# Patient Record
Sex: Male | Born: 1950 | Race: Black or African American | Hispanic: No | Smoking: Never smoker
Health system: Southern US, Community
[De-identification: ages and names within clinical notes are randomized; demographics above are authoritative.]

## PROBLEM LIST (undated history)

## (undated) ENCOUNTER — Emergency Department (HOSPITAL_COMMUNITY): Admission: EM | Payer: BC Managed Care – PPO | Source: Home / Self Care

## (undated) DIAGNOSIS — R4586 Emotional lability: Secondary | ICD-10-CM

## (undated) DIAGNOSIS — N189 Chronic kidney disease, unspecified: Secondary | ICD-10-CM

## (undated) DIAGNOSIS — M109 Gout, unspecified: Secondary | ICD-10-CM

## (undated) DIAGNOSIS — E1165 Type 2 diabetes mellitus with hyperglycemia: Secondary | ICD-10-CM

## (undated) DIAGNOSIS — I10 Essential (primary) hypertension: Secondary | ICD-10-CM

## (undated) DIAGNOSIS — Z9989 Dependence on other enabling machines and devices: Secondary | ICD-10-CM

## (undated) DIAGNOSIS — E785 Hyperlipidemia, unspecified: Secondary | ICD-10-CM

## (undated) DIAGNOSIS — F319 Bipolar disorder, unspecified: Secondary | ICD-10-CM

## (undated) DIAGNOSIS — Z86718 Personal history of other venous thrombosis and embolism: Secondary | ICD-10-CM

## (undated) DIAGNOSIS — E118 Type 2 diabetes mellitus with unspecified complications: Secondary | ICD-10-CM

## (undated) DIAGNOSIS — R600 Localized edema: Secondary | ICD-10-CM

## (undated) DIAGNOSIS — I509 Heart failure, unspecified: Secondary | ICD-10-CM

## (undated) DIAGNOSIS — G4733 Obstructive sleep apnea (adult) (pediatric): Secondary | ICD-10-CM

---

## 2003-04-29 ENCOUNTER — Emergency Department (HOSPITAL_COMMUNITY): Admission: EM | Admit: 2003-04-29 | Discharge: 2003-04-29 | Payer: Self-pay | Admitting: Emergency Medicine

## 2003-11-19 ENCOUNTER — Encounter: Admission: RE | Admit: 2003-11-19 | Discharge: 2004-02-17 | Payer: Self-pay | Admitting: Family Medicine

## 2004-07-19 ENCOUNTER — Ambulatory Visit (HOSPITAL_BASED_OUTPATIENT_CLINIC_OR_DEPARTMENT_OTHER): Admission: RE | Admit: 2004-07-19 | Discharge: 2004-07-19 | Payer: Self-pay | Admitting: Family Medicine

## 2004-10-30 ENCOUNTER — Ambulatory Visit: Payer: Self-pay | Admitting: Psychiatry

## 2004-10-30 ENCOUNTER — Other Ambulatory Visit (HOSPITAL_COMMUNITY): Admission: RE | Admit: 2004-10-30 | Discharge: 2004-11-02 | Payer: Self-pay | Admitting: Psychiatry

## 2009-05-16 ENCOUNTER — Emergency Department (HOSPITAL_COMMUNITY): Admission: EM | Admit: 2009-05-16 | Discharge: 2009-05-17 | Payer: Self-pay | Admitting: Emergency Medicine

## 2009-12-17 ENCOUNTER — Emergency Department (HOSPITAL_COMMUNITY): Admission: EM | Admit: 2009-12-17 | Discharge: 2009-12-17 | Payer: Self-pay | Admitting: Family Medicine

## 2010-01-29 ENCOUNTER — Emergency Department (HOSPITAL_COMMUNITY): Admission: EM | Admit: 2010-01-29 | Discharge: 2010-01-29 | Payer: Self-pay | Admitting: Family Medicine

## 2011-02-14 LAB — CBC
HCT: 37.1 % — ABNORMAL LOW (ref 39.0–52.0)
Hemoglobin: 12.2 g/dL — ABNORMAL LOW (ref 13.0–17.0)
MCHC: 32.9 g/dL (ref 30.0–36.0)
MCV: 84.6 fL (ref 78.0–100.0)
Platelets: 203 10*3/uL (ref 150–400)
RBC: 4.39 MIL/uL (ref 4.22–5.81)
RDW: 13.2 % (ref 11.5–15.5)
WBC: 7.1 10*3/uL (ref 4.0–10.5)

## 2011-02-14 LAB — BODY FLUID CULTURE: Culture: NO GROWTH

## 2011-02-14 LAB — DIFFERENTIAL
Basophils Absolute: 0 10*3/uL (ref 0.0–0.1)
Basophils Relative: 0 % (ref 0–1)
Eosinophils Absolute: 0 10*3/uL (ref 0.0–0.7)
Eosinophils Relative: 0 % (ref 0–5)
Lymphocytes Relative: 12 % (ref 12–46)
Lymphs Abs: 0.8 10*3/uL (ref 0.7–4.0)
Monocytes Absolute: 0.6 10*3/uL (ref 0.1–1.0)
Monocytes Relative: 8 % (ref 3–12)
Neutro Abs: 5.6 10*3/uL (ref 1.7–7.7)
Neutrophils Relative %: 80 % — ABNORMAL HIGH (ref 43–77)

## 2011-02-14 LAB — ANAEROBIC CULTURE

## 2011-02-14 LAB — URIC ACID: Uric Acid, Serum: 10.3 mg/dL — ABNORMAL HIGH (ref 4.0–7.8)

## 2011-02-14 LAB — BODY FLUID CRYSTAL

## 2011-02-14 LAB — GRAM STAIN

## 2011-03-06 ENCOUNTER — Emergency Department (INDEPENDENT_AMBULATORY_CARE_PROVIDER_SITE_OTHER): Payer: BC Managed Care – PPO

## 2011-03-06 ENCOUNTER — Inpatient Hospital Stay (HOSPITAL_COMMUNITY)
Admission: RE | Admit: 2011-03-06 | Discharge: 2011-03-08 | DRG: 294 | Disposition: A | Discharge status: Home / Self Care | Payer: BC Managed Care – PPO | Source: Other Acute Inpatient Hospital | Attending: Family Medicine | Admitting: Family Medicine

## 2011-03-06 ENCOUNTER — Emergency Department (HOSPITAL_BASED_OUTPATIENT_CLINIC_OR_DEPARTMENT_OTHER)
Admission: EM | Admit: 2011-03-06 | Discharge: 2011-03-06 | Disposition: A | Discharge status: Nursing Home (Includes ICF) | Payer: BC Managed Care – PPO | Source: Home / Self Care | Attending: Emergency Medicine | Admitting: Emergency Medicine

## 2011-03-06 DIAGNOSIS — H409 Unspecified glaucoma: Secondary | ICD-10-CM | POA: Diagnosis present

## 2011-03-06 DIAGNOSIS — E7800 Pure hypercholesterolemia, unspecified: Secondary | ICD-10-CM | POA: Insufficient documentation

## 2011-03-06 DIAGNOSIS — I10 Essential (primary) hypertension: Secondary | ICD-10-CM

## 2011-03-06 DIAGNOSIS — R29898 Other symptoms and signs involving the musculoskeletal system: Secondary | ICD-10-CM

## 2011-03-06 DIAGNOSIS — R4789 Other speech disturbances: Secondary | ICD-10-CM | POA: Diagnosis present

## 2011-03-06 DIAGNOSIS — Z79899 Other long term (current) drug therapy: Secondary | ICD-10-CM | POA: Insufficient documentation

## 2011-03-06 DIAGNOSIS — G4730 Sleep apnea, unspecified: Secondary | ICD-10-CM | POA: Insufficient documentation

## 2011-03-06 DIAGNOSIS — E1169 Type 2 diabetes mellitus with other specified complication: Secondary | ICD-10-CM | POA: Insufficient documentation

## 2011-03-06 DIAGNOSIS — R200 Anesthesia of skin: Secondary | ICD-10-CM

## 2011-03-06 DIAGNOSIS — G459 Transient cerebral ischemic attack, unspecified: Secondary | ICD-10-CM | POA: Insufficient documentation

## 2011-03-06 DIAGNOSIS — N189 Chronic kidney disease, unspecified: Secondary | ICD-10-CM | POA: Diagnosis present

## 2011-03-06 DIAGNOSIS — I129 Hypertensive chronic kidney disease with stage 1 through stage 4 chronic kidney disease, or unspecified chronic kidney disease: Secondary | ICD-10-CM | POA: Diagnosis present

## 2011-03-06 DIAGNOSIS — E785 Hyperlipidemia, unspecified: Secondary | ICD-10-CM | POA: Diagnosis present

## 2011-03-06 LAB — POCT CARDIAC MARKERS
CKMB, poc: 1.3 ng/mL (ref 1.0–8.0)
Myoglobin, poc: 114 ng/mL (ref 12–200)
Troponin i, poc: <0.05 ng/mL (ref 0.00–0.09)

## 2011-03-06 LAB — CBC
HCT: 39.3 % (ref 39.0–52.0)
Hemoglobin: 13 g/dL (ref 13.0–17.0)
MCH: 28 pg (ref 26.0–34.0)
MCHC: 33.1 g/dL (ref 30.0–36.0)
MCV: 84.5 fL (ref 78.0–100.0)
Platelets: 148 10*3/uL — ABNORMAL LOW (ref 150–400)
RBC: 4.65 MIL/uL (ref 4.22–5.81)
RDW: 13.2 % (ref 11.5–15.5)
WBC: 3.7 10*3/uL — ABNORMAL LOW (ref 4.0–10.5)

## 2011-03-06 LAB — BASIC METABOLIC PANEL
BUN: 23 mg/dL (ref 6–23)
CO2: 24 mEq/L (ref 19–32)
Calcium: 8.5 mg/dL (ref 8.4–10.5)
Chloride: 103 mEq/L (ref 96–112)
Creatinine, Ser: 1.8 mg/dL — ABNORMAL HIGH (ref 0.4–1.5)
GFR calc Af Amer: 47 mL/min — ABNORMAL LOW (ref >60)
GFR calc non Af Amer: 39 mL/min — ABNORMAL LOW (ref >60)
Glucose, Bld: 114 mg/dL — ABNORMAL HIGH (ref 70–99)
Potassium: 5 mEq/L (ref 3.5–5.1)
Sodium: 140 mEq/L (ref 135–145)

## 2011-03-06 LAB — GLUCOSE, CAPILLARY
Glucose-Capillary: 118 mg/dL — ABNORMAL HIGH (ref 70–99)
Glucose-Capillary: 98 mg/dL (ref 70–99)
Glucose-Capillary: 57 mg/dL — ABNORMAL LOW (ref 70–99)
Glucose-Capillary: 65 mg/dL — ABNORMAL LOW (ref 70–99)
Glucose-Capillary: 111 mg/dL — ABNORMAL HIGH (ref 70–99)
Glucose-Capillary: 88 mg/dL (ref 70–99)

## 2011-03-06 LAB — URINALYSIS, ROUTINE W REFLEX MICROSCOPIC
Bilirubin Urine: NEGATIVE
Glucose, UA: NEGATIVE mg/dL
Hgb urine dipstick: NEGATIVE
Ketones, ur: NEGATIVE mg/dL
Nitrite: NEGATIVE
Protein, ur: NEGATIVE mg/dL
Specific Gravity, Urine: 1.016 (ref 1.005–1.030)
Urobilinogen, UA: 0.2 mg/dL (ref 0.0–1.0)
pH: 5 (ref 5.0–8.0)

## 2011-03-06 LAB — DIFFERENTIAL
Basophils Absolute: 0 10*3/uL (ref 0.0–0.1)
Basophils Relative: 0 % (ref 0–1)
Eosinophils Absolute: 0.1 10*3/uL (ref 0.0–0.7)
Eosinophils Relative: 2 % (ref 0–5)
Lymphocytes Relative: 31 % (ref 12–46)
Lymphs Abs: 1.1 10*3/uL (ref 0.7–4.0)
Monocytes Absolute: 0.5 10*3/uL (ref 0.1–1.0)
Monocytes Relative: 14 % — ABNORMAL HIGH (ref 3–12)
Neutro Abs: 2 10*3/uL (ref 1.7–7.7)
Neutrophils Relative %: 53 % (ref 43–77)

## 2011-03-07 ENCOUNTER — Other Ambulatory Visit (HOSPITAL_COMMUNITY): Payer: BLUE CROSS/BLUE SHIELD

## 2011-03-07 ENCOUNTER — Inpatient Hospital Stay (HOSPITAL_COMMUNITY): Payer: BLUE CROSS/BLUE SHIELD

## 2011-03-07 ENCOUNTER — Inpatient Hospital Stay (HOSPITAL_COMMUNITY): Payer: BC Managed Care – PPO

## 2011-03-07 DIAGNOSIS — G459 Transient cerebral ischemic attack, unspecified: Secondary | ICD-10-CM

## 2011-03-07 LAB — COMPREHENSIVE METABOLIC PANEL
ALT: 32 U/L (ref 0–53)
AST: 31 U/L (ref 0–37)
Albumin: 3.5 g/dL (ref 3.5–5.2)
Alkaline Phosphatase: 55 U/L (ref 39–117)
BUN: 19 mg/dL (ref 6–23)
CO2: 25 mEq/L (ref 19–32)
Calcium: 8.6 mg/dL (ref 8.4–10.5)
Chloride: 104 mEq/L (ref 96–112)
Creatinine, Ser: 1.53 mg/dL — ABNORMAL HIGH (ref 0.4–1.5)
GFR calc Af Amer: 57 mL/min — ABNORMAL LOW (ref >60)
GFR calc non Af Amer: 47 mL/min — ABNORMAL LOW (ref >60)
Glucose, Bld: 135 mg/dL — ABNORMAL HIGH (ref 70–99)
Potassium: 4.2 mEq/L (ref 3.5–5.1)
Sodium: 137 mEq/L (ref 135–145)
Total Bilirubin: 1 mg/dL (ref 0.3–1.2)
Total Protein: 6.3 g/dL (ref 6.0–8.3)

## 2011-03-07 LAB — LIPID PANEL
Cholesterol: 82 mg/dL (ref 0–200)
HDL: 27 mg/dL — ABNORMAL LOW (ref >39)
LDL Cholesterol: 32 mg/dL (ref 0–99)
Total CHOL/HDL Ratio: 3 RATIO
Triglycerides: 114 mg/dL (ref <150)
VLDL: 23 mg/dL (ref 0–40)

## 2011-03-07 LAB — CK TOTAL AND CKMB (NOT AT ARMC)
CK, MB: 2.5 ng/mL (ref 0.3–4.0)
CK, MB: 2 ng/mL (ref 0.3–4.0)
CK, MB: 1.8 ng/mL (ref 0.3–4.0)
Relative Index: 1.3 (ref 0.0–2.5)
Relative Index: 1.1 (ref 0.0–2.5)
Relative Index: 1 (ref 0.0–2.5)
Total CK: 199 U/L (ref 7–232)
Total CK: 181 U/L (ref 7–232)
Total CK: 177 U/L (ref 7–232)

## 2011-03-07 LAB — TSH: TSH: 0.753 u[IU]/mL (ref 0.350–4.500)

## 2011-03-07 LAB — SEDIMENTATION RATE: Sed Rate: 4 mm/hr (ref 0–16)

## 2011-03-07 LAB — RAPID URINE DRUG SCREEN, HOSP PERFORMED
Amphetamines: POSITIVE — AB
Barbiturates: NOT DETECTED
Benzodiazepines: POSITIVE — AB
Cocaine: NOT DETECTED
Opiates: NOT DETECTED
Tetrahydrocannabinol: NOT DETECTED

## 2011-03-07 LAB — CBC
HCT: 38.8 % — ABNORMAL LOW (ref 39.0–52.0)
Hemoglobin: 12.7 g/dL — ABNORMAL LOW (ref 13.0–17.0)
MCH: 28.2 pg (ref 26.0–34.0)
MCHC: 32.7 g/dL (ref 30.0–36.0)
MCV: 86.2 fL (ref 78.0–100.0)
Platelets: 149 10*3/uL — ABNORMAL LOW (ref 150–400)
RBC: 4.5 MIL/uL (ref 4.22–5.81)
RDW: 13.1 % (ref 11.5–15.5)
WBC: 3.8 10*3/uL — ABNORMAL LOW (ref 4.0–10.5)

## 2011-03-07 LAB — GLUCOSE, CAPILLARY
Glucose-Capillary: 179 mg/dL — ABNORMAL HIGH (ref 70–99)
Glucose-Capillary: 179 mg/dL — ABNORMAL HIGH (ref 70–99)
Glucose-Capillary: 183 mg/dL — ABNORMAL HIGH (ref 70–99)
Glucose-Capillary: 191 mg/dL — ABNORMAL HIGH (ref 70–99)
Glucose-Capillary: 219 mg/dL — ABNORMAL HIGH (ref 70–99)
Glucose-Capillary: 95 mg/dL (ref 70–99)

## 2011-03-07 LAB — HEMOGLOBIN A1C
Hgb A1c MFr Bld: 6.2 % — ABNORMAL HIGH (ref <5.7)
Mean Plasma Glucose: 131 mg/dL — ABNORMAL HIGH (ref <117)

## 2011-03-07 LAB — CREATININE, URINE, RANDOM: Creatinine, Urine: 133.9 mg/dL

## 2011-03-07 LAB — UREA NITROGEN, URINE: Urea Nitrogen, Ur: 492 mg/dL

## 2011-03-07 LAB — PHOSPHORUS: Phosphorus: 3 mg/dL (ref 2.3–4.6)

## 2011-03-07 LAB — TROPONIN I
Troponin I: 0.02 ng/mL (ref 0.00–0.06)
Troponin I: 0.01 ng/mL (ref 0.00–0.06)

## 2011-03-07 LAB — SODIUM, URINE, RANDOM: Sodium, Ur: 54 mEq/L

## 2011-03-07 LAB — MICROALBUMIN, URINE: Microalb, Ur: 3.55 mg/dL — ABNORMAL HIGH (ref 0.00–1.89)

## 2011-03-07 LAB — PROTEIN, URINE, RANDOM: Total Protein, Urine: 7 mg/dL

## 2011-03-07 LAB — C-REACTIVE PROTEIN: CRP: 0 mg/dL — ABNORMAL LOW (ref <0.6)

## 2011-03-07 LAB — MAGNESIUM: Magnesium: 1.5 mg/dL (ref 1.5–2.5)

## 2011-03-07 LAB — CORTISOL-AM, BLOOD: Cortisol - AM: 7.4 ug/dL (ref 4.3–22.4)

## 2011-03-08 ENCOUNTER — Inpatient Hospital Stay (HOSPITAL_COMMUNITY): Payer: BC Managed Care – PPO

## 2011-03-08 ENCOUNTER — Other Ambulatory Visit (HOSPITAL_COMMUNITY): Payer: BLUE CROSS/BLUE SHIELD

## 2011-03-08 DIAGNOSIS — G459 Transient cerebral ischemic attack, unspecified: Secondary | ICD-10-CM

## 2011-03-08 LAB — BASIC METABOLIC PANEL
BUN: 11 mg/dL (ref 6–23)
CO2: 25 mEq/L (ref 19–32)
Calcium: 9.5 mg/dL (ref 8.4–10.5)
Chloride: 104 mEq/L (ref 96–112)
Creatinine, Ser: 1.15 mg/dL (ref 0.4–1.5)
GFR calc Af Amer: >60 mL/min (ref >60)
GFR calc non Af Amer: >60 mL/min (ref >60)
Glucose, Bld: 203 mg/dL — ABNORMAL HIGH (ref 70–99)
Potassium: 4.1 mEq/L (ref 3.5–5.1)
Sodium: 137 mEq/L (ref 135–145)

## 2011-03-08 LAB — CULTURE, BLOOD (ROUTINE X 2)
Culture: NO GROWTH
Culture: NO GROWTH

## 2011-03-08 LAB — COMPREHENSIVE METABOLIC PANEL
ALT: 13 U/L (ref 0–53)
AST: 20 U/L (ref 0–37)
Albumin: 3.5 g/dL (ref 3.5–5.2)
Alkaline Phosphatase: 67 U/L (ref 39–117)
BUN: 19 mg/dL (ref 6–23)
CO2: 26 mEq/L (ref 19–32)
Calcium: 8.8 mg/dL (ref 8.4–10.5)
Chloride: 96 mEq/L (ref 96–112)
Creatinine, Ser: 1.79 mg/dL — ABNORMAL HIGH (ref 0.4–1.5)
GFR calc Af Amer: 48 mL/min — ABNORMAL LOW (ref >60)
GFR calc non Af Amer: 40 mL/min — ABNORMAL LOW (ref >60)
Glucose, Bld: 92 mg/dL (ref 70–99)
Potassium: 4 mEq/L (ref 3.5–5.1)
Sodium: 131 mEq/L — ABNORMAL LOW (ref 135–145)
Total Bilirubin: 0.7 mg/dL (ref 0.3–1.2)
Total Protein: 7.8 g/dL (ref 6.0–8.3)

## 2011-03-08 LAB — URINALYSIS, ROUTINE W REFLEX MICROSCOPIC
Bilirubin Urine: NEGATIVE
Glucose, UA: NEGATIVE mg/dL
Hgb urine dipstick: NEGATIVE
Ketones, ur: NEGATIVE mg/dL
Nitrite: NEGATIVE
Protein, ur: NEGATIVE mg/dL
Specific Gravity, Urine: 1.017 (ref 1.005–1.030)
Urobilinogen, UA: 1 mg/dL (ref 0.0–1.0)
pH: 5.5 (ref 5.0–8.0)

## 2011-03-08 LAB — PROTIME-INR
INR: 1 (ref 0.00–1.49)
Prothrombin Time: 13.7 seconds (ref 11.6–15.2)

## 2011-03-08 LAB — CBC
HCT: 40.6 % (ref 39.0–52.0)
HCT: 37.7 % — ABNORMAL LOW (ref 39.0–52.0)
Hemoglobin: 13 g/dL (ref 13.0–17.0)
Hemoglobin: 12.2 g/dL — ABNORMAL LOW (ref 13.0–17.0)
MCH: 27.3 pg (ref 26.0–34.0)
MCHC: 32 g/dL (ref 30.0–36.0)
MCHC: 32.4 g/dL (ref 30.0–36.0)
MCV: 85.3 fL (ref 78.0–100.0)
MCV: 86.8 fL (ref 78.0–100.0)
Platelets: 143 10*3/uL — ABNORMAL LOW (ref 150–400)
Platelets: 248 10*3/uL (ref 150–400)
RBC: 4.76 MIL/uL (ref 4.22–5.81)
RBC: 4.34 MIL/uL (ref 4.22–5.81)
RDW: 13 % (ref 11.5–15.5)
RDW: 12.9 % (ref 11.5–15.5)
WBC: 3.4 10*3/uL — ABNORMAL LOW (ref 4.0–10.5)
WBC: 7.7 10*3/uL (ref 4.0–10.5)

## 2011-03-08 LAB — DIFFERENTIAL
Basophils Absolute: 0 10*3/uL (ref 0.0–0.1)
Basophils Relative: 0 % (ref 0–1)
Eosinophils Absolute: 0 10*3/uL (ref 0.0–0.7)
Eosinophils Relative: 1 % (ref 0–5)
Lymphocytes Relative: 10 % — ABNORMAL LOW (ref 12–46)
Lymphs Abs: 0.8 10*3/uL (ref 0.7–4.0)
Monocytes Absolute: 0.6 10*3/uL (ref 0.1–1.0)
Monocytes Relative: 7 % (ref 3–12)
Neutro Abs: 6.4 10*3/uL (ref 1.7–7.7)
Neutrophils Relative %: 82 % — ABNORMAL HIGH (ref 43–77)

## 2011-03-08 LAB — URINE MICROSCOPIC-ADD ON

## 2011-03-08 LAB — LIPID PANEL
Cholesterol: 99 mg/dL (ref 0–200)
HDL: 34 mg/dL — ABNORMAL LOW (ref >39)
LDL Cholesterol: 53 mg/dL (ref 0–99)
Total CHOL/HDL Ratio: 2.9 RATIO
Triglycerides: 62 mg/dL (ref <150)
VLDL: 12 mg/dL (ref 0–40)

## 2011-03-08 LAB — GLUCOSE, CAPILLARY
Glucose-Capillary: 126 mg/dL — ABNORMAL HIGH (ref 70–99)
Glucose-Capillary: 214 mg/dL — ABNORMAL HIGH (ref 70–99)
Glucose-Capillary: 254 mg/dL — ABNORMAL HIGH (ref 70–99)

## 2011-03-08 LAB — URIC ACID: Uric Acid, Serum: 11.3 mg/dL — ABNORMAL HIGH (ref 4.0–7.8)

## 2011-03-08 LAB — BRAIN NATRIURETIC PEPTIDE: Pro B Natriuretic peptide (BNP): <30 pg/mL (ref 0.0–100.0)

## 2011-03-08 NOTE — H&P (Signed)
NAME:  Christian [name], Christian [name] NO.:  [id number]  MEDICAL RECORD NO.:  [id number]           PATIENT TYPE:  I  LOCATION:  1418                         FACILITY:  [facility]  PHYSICIAN:  Tana Felts, MD     DATE OF BIRTH:  March 08, 1951  DATE OF ADMISSION:  03/06/2011 DATE OF DISCHARGE:                             HISTORY & PHYSICAL   CHIEF COMPLAINT:  Hypoglycemia and possible TIA.  HISTORY OF PRESENT ILLNESS:  This is a 60 year old African American man with a complicated past medical history notable for diabetes and depression who presented to [facility] with hypoglycemia with glucose level of 40.  At the same time, he had slurred speech and weakness on the left side.  These symptoms resolved when he was given glucose.  He states he fell back at his baseline.  He does note that an episode similar to this happened earlier this week, but he did not make much of it.  Other than that episode, it has never occurred before.  He did eat breakfast and a light lunch today, but otherwise, has not eaten very much.  Of note, he is a long-standing diabetic and used to be on several different diabetic medications including insulin drip.  In the last year, he states he has lost 85 pounds intentionally and has been able to come off all these medications except for metformin.  He denies any nausea or vomiting associated with the episode.  He did not lose consciousness, although he fell a couple of times while his left side is weak.  A CT scan showed no acute changes, but he did have some renal dysfunction, so contrast was not used.  He denies any recent illnesses.  He otherwise is feeling fine and otherwise review of systems is negative.  PAST MEDICAL HISTORY: 1. Diabetes type 2. 2. Obesity. 3. Gout. 4. Hypertension. 5. GERD. 6. Depression. 7. Sleep apnea, on CPAP at home. 8. Hypercholesterolemia. 9. Glaucoma. 10.Hypokalemia.  SOCIAL HISTORY:  The patient  does not smoke or drink alcohol.  He works as a Runner, broadcasting/film/video.  FAMILY HISTORY:  Denies any contributory family history.  ALLERGIES:  No known drug allergies.  MEDICATIONS:  The patient does not know the doses of his medications, although he did have a list available of names of them and doses were guessed from treatment note about a year ago.  They include: 1. Geodon 80 mg daily. 2. Furosemide 20 mg daily. 3. Zolpidem 10 mg at bedtime as needed. 4. Ativan 1 mg as needed. 5. Metoprolol 100 mg daily. 6. Ranitidine 300 mg daily. 7. __________50 mg daily. 8. Simvastatin 40 mg daily. 9. Metformin 2 pills twice a day. 10.Potassium. 11.Colchicine 0.6 mg daily. 12.Uloric 80 mg daily. 13.Telmisartan, unknown dose. 14.Travatan eye drops daily. 15.Benztropine 1 mg daily. 16.Venlafaxine 100mg  daily.  PHYSICAL EXAMINATION:  VITAL SIGNS:  Blood pressure 135/70, pulse 61, respiratory rate 18, saturating 98% on room air, glucose of 65. GENERAL:  This is a well-nourished, well-developed, obese gentleman, in no acute distress.  He does appear to have slightly depressed affect but appropriate throughout the interview. HEENT:  Pupils equal, round and reactive to light.  Extraocular movements intact.  Cranial nerves are symmetric and intact.  Moist mucous membranes.  No thrush or oropharyngeal findings. NECK:  Supple. LUNGS:  Clear to auscultation bilaterally. CARDIAC:  Regular rate and rhythm.  No murmurs, rubs, or gallops. ABDOMEN:  Soft, nontender, and nondistended.  Normal bowel sounds. EXTREMITIES:  Warm and well perfused without cyanosis, clubbing, or edema. NEURO:  As mentioned, cranial nerve exam is normal.  He has 5 out of 5 strength in all extremities and denies any numbness.  Gait was not assessed.  He had normal rapid alternating motions in both upper extremities.  Cognition appears intact. SKIN:  No rash or lesions. PSYCHIATRIC:  Mood is fair.  Affect somewhat restricted, but he  is appropriate throughout the interview.  LABORATORY DATA:  Urinalysis is normal.  Cardiac markers are normal x 1. Potassium is 5, creatinine 1.8, BUN 23, glucose 114.  CBC showed white count 3.7, hemoglobin 13, platelets 148.  Normal diff other than slight monocytosis.  RADIOLOGICAL DATA:  The chest x-ray showed low lung volumes with no acute findings.  Head CT showed a mild chronic microvascular ischemia with no acute intracranial abnormality.  IMPRESSION:  This is a 60 year old man with focal left-sided weakness and slurred speech in the context of hypoglycemia.  His symptoms resolved after he received D50 suggesting the hypoglycemia was the cause of these.  However, given his multiple cardiovascular risk factors and possible TIA, it is also possible that hypoglycemia caused transient manifestation of subclinical neurological deficits caused by a previous stroke.  PLAN: 1. Hypoglycemia.  This patient's metformin was held and he will be on     a sliding scale with hypoglycemia protocol.  He was given a carb     controlled diet and we will check hemoglobin A1c.  I suspect that     in context of his large weight loss he has much better insulin     sensitivity and that may be contributing as well and we will also     hold his metoprolol because it can mask the symptoms of     hypoglycemia and his blood pressure while slightly elevated is not     dangerously high. 2. Possible TIA.  As mentioned above, I suspect this is due to     hypoglycemia.  This will need to be worked up.  He is ordered for     an MRI.  He will not get gadolinium because of his renal     dysfunction.  We also will check TSH, ESR, CRP, and fasting lipids.     For his hypoglycemia, we have also ordered an a.m. cortisol.  He     should be on an aspirin given all of his cardiac medications.  The patient does not know why he has not been prescribed an aspirin, and he denies any history of peptic ulcers so he was started  on aspirin 325 mg PO daily. 3. Renal dysfunction.  It is unclear why he has acute renal failure.  His ranitidine dose was halved and his     telmisartan held in the context of possible ARB-mediated renal     dysfunction.  His Lasix was also held in case there is a volume     depletion.  Urine sodium, urea, protein, and creatinine were sent     and a renal ultrasound was ordered. 4. Hypertension.  Blood pressure is only mildly elevated.  He will  continue his verapamil. 5. Depression.  The patient will continue on Geodon, benztropine, and     venlafaxine. 6. Glaucoma.  The patient will continue on Travatan. 7. Gout.  The patient does not had any evidence of gout currently.  We     will continue on colchicine and Uloric. 8. Hypercholesterolemia.  The patient will continue on simvastatin.     Tana Felts, MD     NB/MEDQ  D:  03/06/2011  T:  03/07/2011  Job:  [id number]  Electronically Signed by Tana Felts M.D. on 03/08/2011 09:33:38 AM

## 2011-03-23 ENCOUNTER — Emergency Department (HOSPITAL_COMMUNITY): Payer: BC Managed Care – PPO

## 2011-03-23 ENCOUNTER — Inpatient Hospital Stay (HOSPITAL_COMMUNITY)
Admission: EM | Admit: 2011-03-23 | Discharge: 2011-03-30 | DRG: 582 | Disposition: A | Discharge status: Other Acute Inpatient Hospital | Payer: BC Managed Care – PPO | Attending: Internal Medicine | Admitting: Internal Medicine

## 2011-03-23 DIAGNOSIS — X789XXA Intentional self-harm by unspecified sharp object, initial encounter: Secondary | ICD-10-CM | POA: Diagnosis present

## 2011-03-23 DIAGNOSIS — E669 Obesity, unspecified: Secondary | ICD-10-CM | POA: Diagnosis present

## 2011-03-23 DIAGNOSIS — E872 Acidosis, unspecified: Secondary | ICD-10-CM | POA: Diagnosis present

## 2011-03-23 DIAGNOSIS — X832XXA Intentional self-harm by exposure to extremes of cold, initial encounter: Secondary | ICD-10-CM | POA: Diagnosis present

## 2011-03-23 DIAGNOSIS — E875 Hyperkalemia: Secondary | ICD-10-CM | POA: Diagnosis not present

## 2011-03-23 DIAGNOSIS — I498 Other specified cardiac arrhythmias: Secondary | ICD-10-CM | POA: Diagnosis present

## 2011-03-23 DIAGNOSIS — I10 Essential (primary) hypertension: Secondary | ICD-10-CM | POA: Diagnosis present

## 2011-03-23 DIAGNOSIS — N179 Acute kidney failure, unspecified: Secondary | ICD-10-CM | POA: Diagnosis present

## 2011-03-23 DIAGNOSIS — F3289 Other specified depressive episodes: Secondary | ICD-10-CM | POA: Diagnosis present

## 2011-03-23 DIAGNOSIS — T68XXXA Hypothermia, initial encounter: Principal | ICD-10-CM | POA: Diagnosis present

## 2011-03-23 DIAGNOSIS — F329 Major depressive disorder, single episode, unspecified: Secondary | ICD-10-CM | POA: Diagnosis present

## 2011-03-23 DIAGNOSIS — R319 Hematuria, unspecified: Secondary | ICD-10-CM | POA: Diagnosis not present

## 2011-03-23 DIAGNOSIS — IMO0002 Reserved for concepts with insufficient information to code with codable children: Secondary | ICD-10-CM | POA: Diagnosis present

## 2011-03-23 DIAGNOSIS — E119 Type 2 diabetes mellitus without complications: Secondary | ICD-10-CM | POA: Diagnosis present

## 2011-03-23 DIAGNOSIS — G4733 Obstructive sleep apnea (adult) (pediatric): Secondary | ICD-10-CM | POA: Diagnosis present

## 2011-03-23 LAB — URINALYSIS, ROUTINE W REFLEX MICROSCOPIC
Glucose, UA: NEGATIVE mg/dL
Ketones, ur: 15 mg/dL — AB
Nitrite: NEGATIVE
Protein, ur: NEGATIVE mg/dL
Specific Gravity, Urine: 1.02 (ref 1.005–1.030)
Urobilinogen, UA: 0.2 mg/dL (ref 0.0–1.0)
pH: 5 (ref 5.0–8.0)

## 2011-03-23 LAB — RAPID URINE DRUG SCREEN, HOSP PERFORMED
Amphetamines: NOT DETECTED
Barbiturates: NOT DETECTED
Benzodiazepines: POSITIVE — AB
Cocaine: NOT DETECTED
Opiates: NOT DETECTED
Tetrahydrocannabinol: NOT DETECTED

## 2011-03-23 LAB — POCT I-STAT, CHEM 8
BUN: 40 mg/dL — ABNORMAL HIGH (ref 6–23)
Calcium, Ion: 0.95 mmol/L — ABNORMAL LOW (ref 1.12–1.32)
Chloride: 102 mEq/L (ref 96–112)
Creatinine, Ser: 1.7 mg/dL — ABNORMAL HIGH (ref 0.4–1.5)
Glucose, Bld: 213 mg/dL — ABNORMAL HIGH (ref 70–99)
HCT: 48 % (ref 39.0–52.0)
Hemoglobin: 16.3 g/dL (ref 13.0–17.0)
Potassium: 3.9 mEq/L (ref 3.5–5.1)
Sodium: 136 mEq/L (ref 135–145)
TCO2: 19 mmol/L (ref 0–100)

## 2011-03-23 LAB — TYPE AND SCREEN
ABO/RH(D): B POS
Antibody Screen: POSITIVE
DAT, IgG: NEGATIVE
PT AG Type: NEGATIVE
Unit division: 0
Unit division: 0

## 2011-03-23 LAB — CK: Total CK: 703 U/L — ABNORMAL HIGH (ref 7–232)

## 2011-03-23 LAB — PROTIME-INR
INR: 0.99 (ref 0.00–1.49)
Prothrombin Time: 13.3 seconds (ref 11.6–15.2)

## 2011-03-23 LAB — COMPREHENSIVE METABOLIC PANEL
ALT: 70 U/L — ABNORMAL HIGH (ref 0–53)
AST: 45 U/L — ABNORMAL HIGH (ref 0–37)
Albumin: 3.5 g/dL (ref 3.5–5.2)
Alkaline Phosphatase: 94 U/L (ref 39–117)
BUN: 34 mg/dL — ABNORMAL HIGH (ref 6–23)
CO2: 18 mEq/L — ABNORMAL LOW (ref 19–32)
Calcium: 8.3 mg/dL — ABNORMAL LOW (ref 8.4–10.5)
Chloride: 98 mEq/L (ref 96–112)
Creatinine, Ser: 2.01 mg/dL — ABNORMAL HIGH (ref 0.4–1.5)
GFR calc Af Amer: 42 mL/min — ABNORMAL LOW (ref >60)
GFR calc non Af Amer: 34 mL/min — ABNORMAL LOW (ref >60)
Glucose, Bld: 213 mg/dL — ABNORMAL HIGH (ref 70–99)
Potassium: 4.5 mEq/L (ref 3.5–5.1)
Sodium: 137 mEq/L (ref 135–145)
Total Bilirubin: 1.5 mg/dL — ABNORMAL HIGH (ref 0.3–1.2)
Total Protein: 6.6 g/dL (ref 6.0–8.3)

## 2011-03-23 LAB — LACTIC ACID, PLASMA: Lactic Acid, Venous: 8.7 mmol/L — ABNORMAL HIGH (ref 0.5–2.2)

## 2011-03-23 LAB — GLUCOSE, CAPILLARY
Glucose-Capillary: 208 mg/dL — ABNORMAL HIGH (ref 70–99)
Glucose-Capillary: 243 mg/dL — ABNORMAL HIGH (ref 70–99)
Glucose-Capillary: 259 mg/dL — ABNORMAL HIGH (ref 70–99)
Glucose-Capillary: 305 mg/dL — ABNORMAL HIGH (ref 70–99)

## 2011-03-23 LAB — CBC
HCT: 47 % (ref 39.0–52.0)
Hemoglobin: 16.1 g/dL (ref 13.0–17.0)
MCH: 28.8 pg (ref 26.0–34.0)
MCHC: 34.3 g/dL (ref 30.0–36.0)
MCV: 83.9 fL (ref 78.0–100.0)
Platelets: 228 10*3/uL (ref 150–400)
RBC: 5.6 MIL/uL (ref 4.22–5.81)
RDW: 13.2 % (ref 11.5–15.5)
WBC: 14.2 10*3/uL — ABNORMAL HIGH (ref 4.0–10.5)

## 2011-03-23 LAB — MRSA PCR SCREENING: MRSA by PCR: POSITIVE — AB

## 2011-03-23 LAB — T3 UPTAKE: T3 Uptake Ratio: 42.8 % — ABNORMAL HIGH (ref 22.5–37.0)

## 2011-03-23 LAB — URINE MICROSCOPIC-ADD ON

## 2011-03-23 LAB — SALICYLATE LEVEL: Salicylate Lvl: <4 mg/dL (ref 2.8–20.0)

## 2011-03-23 LAB — ACETAMINOPHEN LEVEL: Acetaminophen (Tylenol), Serum: <10 ug/mL — ABNORMAL LOW (ref 10–30)

## 2011-03-23 LAB — ETHANOL: Alcohol, Ethyl (B): <5 mg/dL (ref 0–10)

## 2011-03-23 MED ORDER — IOHEXOL 300 MG/ML  SOLN
100.0000 mL | Freq: Once | INTRAMUSCULAR | Status: AC | PRN
  Administered 2011-03-23: 100 mL via INTRAVENOUS

## 2011-03-24 ENCOUNTER — Inpatient Hospital Stay (HOSPITAL_COMMUNITY): Payer: BC Managed Care – PPO

## 2011-03-24 DIAGNOSIS — R0902 Hypoxemia: Secondary | ICD-10-CM

## 2011-03-24 DIAGNOSIS — R0602 Shortness of breath: Secondary | ICD-10-CM

## 2011-03-24 DIAGNOSIS — I808 Phlebitis and thrombophlebitis of other sites: Secondary | ICD-10-CM

## 2011-03-24 DIAGNOSIS — F19939 Other psychoactive substance use, unspecified with withdrawal, unspecified: Secondary | ICD-10-CM

## 2011-03-24 LAB — BLOOD GAS, ARTERIAL
Acid-base deficit: 4.4 mmol/L — ABNORMAL HIGH (ref 0.0–2.0)
Bicarbonate: 19.9 mEq/L — ABNORMAL LOW (ref 20.0–24.0)
Drawn by: 22563
O2 Content: 2 L/min
O2 Saturation: 97.2 %
Patient temperature: 98.6
TCO2: 20.9 mmol/L (ref 0–100)
pCO2 arterial: 34.7 mmHg — ABNORMAL LOW (ref 35.0–45.0)
pH, Arterial: 7.377 (ref 7.350–7.450)
pO2, Arterial: 100 mmHg (ref 80.0–100.0)

## 2011-03-24 LAB — CBC
HCT: 42.7 % (ref 39.0–52.0)
Hemoglobin: 14.6 g/dL (ref 13.0–17.0)
MCH: 28.1 pg (ref 26.0–34.0)
MCHC: 34.2 g/dL (ref 30.0–36.0)
MCV: 82.1 fL (ref 78.0–100.0)
Platelets: 194 10*3/uL (ref 150–400)
RBC: 5.2 MIL/uL (ref 4.22–5.81)
RDW: 13.5 % (ref 11.5–15.5)
WBC: 12.3 10*3/uL — ABNORMAL HIGH (ref 4.0–10.5)

## 2011-03-24 LAB — GLUCOSE, CAPILLARY
Glucose-Capillary: 288 mg/dL — ABNORMAL HIGH (ref 70–99)
Glucose-Capillary: 280 mg/dL — ABNORMAL HIGH (ref 70–99)
Glucose-Capillary: 321 mg/dL — ABNORMAL HIGH (ref 70–99)
Glucose-Capillary: 258 mg/dL — ABNORMAL HIGH (ref 70–99)

## 2011-03-24 LAB — COMPREHENSIVE METABOLIC PANEL
ALT: 52 U/L (ref 0–53)
AST: 44 U/L — ABNORMAL HIGH (ref 0–37)
Albumin: 3.3 g/dL — ABNORMAL LOW (ref 3.5–5.2)
Alkaline Phosphatase: 75 U/L (ref 39–117)
BUN: 51 mg/dL — ABNORMAL HIGH (ref 6–23)
CO2: 23 mEq/L (ref 19–32)
Calcium: 8.4 mg/dL (ref 8.4–10.5)
Chloride: 99 mEq/L (ref 96–112)
Creatinine, Ser: 2.39 mg/dL — ABNORMAL HIGH (ref 0.4–1.5)
GFR calc Af Amer: 34 mL/min — ABNORMAL LOW (ref >60)
GFR calc non Af Amer: 28 mL/min — ABNORMAL LOW (ref >60)
Glucose, Bld: 318 mg/dL — ABNORMAL HIGH (ref 70–99)
Potassium: 4.8 mEq/L (ref 3.5–5.1)
Sodium: 134 mEq/L — ABNORMAL LOW (ref 135–145)
Total Bilirubin: 1.1 mg/dL (ref 0.3–1.2)
Total Protein: 5.9 g/dL — ABNORMAL LOW (ref 6.0–8.3)

## 2011-03-24 LAB — CK: Total CK: 886 U/L — ABNORMAL HIGH (ref 7–232)

## 2011-03-24 LAB — LACTIC ACID, PLASMA: Lactic Acid, Venous: 2.3 mmol/L — ABNORMAL HIGH (ref 0.5–2.2)

## 2011-03-25 ENCOUNTER — Inpatient Hospital Stay (HOSPITAL_COMMUNITY): Payer: BC Managed Care – PPO

## 2011-03-25 LAB — CBC
HCT: 40.4 % (ref 39.0–52.0)
Hemoglobin: 13.9 g/dL (ref 13.0–17.0)
MCH: 28.7 pg (ref 26.0–34.0)
MCHC: 34.4 g/dL (ref 30.0–36.0)
MCV: 83.5 fL (ref 78.0–100.0)
Platelets: 166 10*3/uL (ref 150–400)
RBC: 4.84 MIL/uL (ref 4.22–5.81)
RDW: 13.7 % (ref 11.5–15.5)
WBC: 12.3 10*3/uL — ABNORMAL HIGH (ref 4.0–10.5)

## 2011-03-25 LAB — GLUCOSE, CAPILLARY
Glucose-Capillary: 264 mg/dL — ABNORMAL HIGH (ref 70–99)
Glucose-Capillary: 289 mg/dL — ABNORMAL HIGH (ref 70–99)
Glucose-Capillary: 272 mg/dL — ABNORMAL HIGH (ref 70–99)
Glucose-Capillary: 251 mg/dL — ABNORMAL HIGH (ref 70–99)

## 2011-03-25 LAB — BASIC METABOLIC PANEL
BUN: 46 mg/dL — ABNORMAL HIGH (ref 6–23)
CO2: 24 mEq/L (ref 19–32)
Calcium: 8.7 mg/dL (ref 8.4–10.5)
Chloride: 104 mEq/L (ref 96–112)
Creatinine, Ser: 1.74 mg/dL — ABNORMAL HIGH (ref 0.4–1.5)
GFR calc Af Amer: 49 mL/min — ABNORMAL LOW (ref >60)
GFR calc non Af Amer: 41 mL/min — ABNORMAL LOW (ref >60)
Glucose, Bld: 294 mg/dL — ABNORMAL HIGH (ref 70–99)
Potassium: 4.2 mEq/L (ref 3.5–5.1)
Sodium: 138 mEq/L (ref 135–145)

## 2011-03-26 DIAGNOSIS — I517 Cardiomegaly: Secondary | ICD-10-CM

## 2011-03-26 DIAGNOSIS — F39 Unspecified mood [affective] disorder: Secondary | ICD-10-CM

## 2011-03-26 LAB — RENAL FUNCTION PANEL
Albumin: 2.7 g/dL — ABNORMAL LOW (ref 3.5–5.2)
BUN: 31 mg/dL — ABNORMAL HIGH (ref 6–23)
CO2: 26 mEq/L (ref 19–32)
Calcium: 8.3 mg/dL — ABNORMAL LOW (ref 8.4–10.5)
Chloride: 103 mEq/L (ref 96–112)
Creatinine, Ser: 1.09 mg/dL (ref 0.4–1.5)
GFR calc Af Amer: >60 mL/min (ref >60)
GFR calc non Af Amer: >60 mL/min (ref >60)
Glucose, Bld: 194 mg/dL — ABNORMAL HIGH (ref 70–99)
Phosphorus: 1.8 mg/dL — ABNORMAL LOW (ref 2.3–4.6)
Potassium: 3.7 mEq/L (ref 3.5–5.1)
Sodium: 135 mEq/L (ref 135–145)

## 2011-03-26 LAB — DIFFERENTIAL
Basophils Absolute: 0 10*3/uL (ref 0.0–0.1)
Basophils Relative: 0 % (ref 0–1)
Eosinophils Absolute: 0.1 10*3/uL (ref 0.0–0.7)
Eosinophils Relative: 1 % (ref 0–5)
Lymphocytes Relative: 14 % (ref 12–46)
Lymphs Abs: 1 10*3/uL (ref 0.7–4.0)
Monocytes Absolute: 0.7 10*3/uL (ref 0.1–1.0)
Monocytes Relative: 10 % (ref 3–12)
Neutro Abs: 5.2 10*3/uL (ref 1.7–7.7)
Neutrophils Relative %: 74 % (ref 43–77)

## 2011-03-26 LAB — CBC
HCT: 34.9 % — ABNORMAL LOW (ref 39.0–52.0)
Hemoglobin: 11.7 g/dL — ABNORMAL LOW (ref 13.0–17.0)
MCH: 28.3 pg (ref 26.0–34.0)
MCHC: 33.5 g/dL (ref 30.0–36.0)
MCV: 84.5 fL (ref 78.0–100.0)
Platelets: 138 10*3/uL — ABNORMAL LOW (ref 150–400)
RBC: 4.13 MIL/uL — ABNORMAL LOW (ref 4.22–5.81)
RDW: 13.5 % (ref 11.5–15.5)
WBC: 7 10*3/uL (ref 4.0–10.5)

## 2011-03-26 LAB — GLUCOSE, CAPILLARY
Glucose-Capillary: 164 mg/dL — ABNORMAL HIGH (ref 70–99)
Glucose-Capillary: 160 mg/dL — ABNORMAL HIGH (ref 70–99)
Glucose-Capillary: 58 mg/dL — ABNORMAL LOW (ref 70–99)
Glucose-Capillary: 60 mg/dL — ABNORMAL LOW (ref 70–99)
Glucose-Capillary: 70 mg/dL (ref 70–99)
Glucose-Capillary: 91 mg/dL (ref 70–99)
Glucose-Capillary: 160 mg/dL — ABNORMAL HIGH (ref 70–99)
Glucose-Capillary: 137 mg/dL — ABNORMAL HIGH (ref 70–99)
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
## 2011-03-27 LAB — GLUCOSE, CAPILLARY
Glucose-Capillary: 125 mg/dL — ABNORMAL HIGH (ref 70–99)
Glucose-Capillary: 160 mg/dL — ABNORMAL HIGH (ref 70–99)
Glucose-Capillary: 160 mg/dL — ABNORMAL HIGH (ref 70–99)
Glucose-Capillary: 163 mg/dL — ABNORMAL HIGH (ref 70–99)
Glucose-Capillary: 131 mg/dL — ABNORMAL HIGH (ref 70–99)

## 2011-03-27 LAB — BASIC METABOLIC PANEL
BUN: 18 mg/dL (ref 6–23)
CO2: 26 mEq/L (ref 19–32)
Calcium: 8.4 mg/dL (ref 8.4–10.5)
Chloride: 106 mEq/L (ref 96–112)
Creatinine, Ser: 1.19 mg/dL (ref 0.4–1.5)
GFR calc Af Amer: >60 mL/min (ref >60)
GFR calc non Af Amer: >60 mL/min (ref >60)
Glucose, Bld: 152 mg/dL — ABNORMAL HIGH (ref 70–99)
Potassium: 3.8 mEq/L (ref 3.5–5.1)
Sodium: 140 mEq/L (ref 135–145)

## 2011-03-27 LAB — CBC
HCT: 31.8 % — ABNORMAL LOW (ref 39.0–52.0)
Hemoglobin: 10.5 g/dL — ABNORMAL LOW (ref 13.0–17.0)
MCH: 28 pg (ref 26.0–34.0)
MCHC: 33 g/dL (ref 30.0–36.0)
MCV: 84.8 fL (ref 78.0–100.0)
Platelets: 138 10*3/uL — ABNORMAL LOW (ref 150–400)
RBC: 3.75 MIL/uL — ABNORMAL LOW (ref 4.22–5.81)
RDW: 13.5 % (ref 11.5–15.5)
WBC: 5 10*3/uL (ref 4.0–10.5)

## 2011-03-28 LAB — GLUCOSE, CAPILLARY
Glucose-Capillary: 118 mg/dL — ABNORMAL HIGH (ref 70–99)
Glucose-Capillary: 154 mg/dL — ABNORMAL HIGH (ref 70–99)
Glucose-Capillary: 165 mg/dL — ABNORMAL HIGH (ref 70–99)
Glucose-Capillary: 166 mg/dL — ABNORMAL HIGH (ref 70–99)

## 2011-03-29 DIAGNOSIS — F39 Unspecified mood [affective] disorder: Secondary | ICD-10-CM

## 2011-03-29 LAB — GLUCOSE, CAPILLARY
Glucose-Capillary: 144 mg/dL — ABNORMAL HIGH (ref 70–99)
Glucose-Capillary: 176 mg/dL — ABNORMAL HIGH (ref 70–99)
Glucose-Capillary: 153 mg/dL — ABNORMAL HIGH (ref 70–99)
Glucose-Capillary: 157 mg/dL — ABNORMAL HIGH (ref 70–99)

## 2011-03-30 ENCOUNTER — Inpatient Hospital Stay (HOSPITAL_COMMUNITY)
Admission: AD | Admit: 2011-03-30 | Discharge: 2011-04-16 | DRG: 430 | Disposition: A | Discharge status: Home / Self Care | Payer: BC Managed Care – PPO | Attending: Psychiatry | Admitting: Psychiatry

## 2011-03-30 ENCOUNTER — Inpatient Hospital Stay (HOSPITAL_COMMUNITY): Payer: BC Managed Care – PPO

## 2011-03-30 DIAGNOSIS — G2401 Drug induced subacute dyskinesia: Secondary | ICD-10-CM | POA: Diagnosis present

## 2011-03-30 DIAGNOSIS — G4730 Sleep apnea, unspecified: Secondary | ICD-10-CM | POA: Diagnosis present

## 2011-03-30 DIAGNOSIS — N401 Enlarged prostate with lower urinary tract symptoms: Secondary | ICD-10-CM | POA: Diagnosis present

## 2011-03-30 DIAGNOSIS — E119 Type 2 diabetes mellitus without complications: Secondary | ICD-10-CM | POA: Diagnosis present

## 2011-03-30 DIAGNOSIS — F333 Major depressive disorder, recurrent, severe with psychotic symptoms: Principal | ICD-10-CM | POA: Diagnosis present

## 2011-03-30 DIAGNOSIS — R339 Retention of urine, unspecified: Secondary | ICD-10-CM | POA: Diagnosis present

## 2011-03-30 DIAGNOSIS — I10 Essential (primary) hypertension: Secondary | ICD-10-CM | POA: Diagnosis present

## 2011-03-30 DIAGNOSIS — E785 Hyperlipidemia, unspecified: Secondary | ICD-10-CM | POA: Diagnosis present

## 2011-03-30 DIAGNOSIS — K219 Gastro-esophageal reflux disease without esophagitis: Secondary | ICD-10-CM | POA: Diagnosis present

## 2011-03-30 DIAGNOSIS — E876 Hypokalemia: Secondary | ICD-10-CM | POA: Diagnosis present

## 2011-03-30 DIAGNOSIS — N138 Other obstructive and reflux uropathy: Secondary | ICD-10-CM | POA: Diagnosis present

## 2011-03-30 LAB — CBC
HCT: 31.3 % — ABNORMAL LOW (ref 39.0–52.0)
Hemoglobin: 10.8 g/dL — ABNORMAL LOW (ref 13.0–17.0)
MCH: 28.3 pg (ref 26.0–34.0)
MCHC: 34.5 g/dL (ref 30.0–36.0)
MCV: 82.2 fL (ref 78.0–100.0)
Platelets: 149 10*3/uL — ABNORMAL LOW (ref 150–400)
RBC: 3.81 MIL/uL — ABNORMAL LOW (ref 4.22–5.81)
RDW: 13.1 % (ref 11.5–15.5)
WBC: 5 10*3/uL (ref 4.0–10.5)

## 2011-03-30 LAB — URINE MICROSCOPIC-ADD ON

## 2011-03-30 LAB — URINALYSIS, ROUTINE W REFLEX MICROSCOPIC
Bilirubin Urine: NEGATIVE
Glucose, UA: NEGATIVE mg/dL
Ketones, ur: 15 mg/dL — AB
Leukocytes, UA: NEGATIVE
Nitrite: NEGATIVE
Protein, ur: NEGATIVE mg/dL
Specific Gravity, Urine: 1.017 (ref 1.005–1.030)
Urobilinogen, UA: 0.2 mg/dL (ref 0.0–1.0)
pH: 5.5 (ref 5.0–8.0)

## 2011-03-30 LAB — BASIC METABOLIC PANEL
BUN: 11 mg/dL (ref 6–23)
CO2: 29 mEq/L (ref 19–32)
Calcium: 8.4 mg/dL (ref 8.4–10.5)
Chloride: 99 mEq/L (ref 96–112)
Creatinine, Ser: 1.27 mg/dL (ref 0.4–1.5)
GFR calc Af Amer: >60 mL/min (ref >60)
GFR calc non Af Amer: 58 mL/min — ABNORMAL LOW (ref >60)
Glucose, Bld: 145 mg/dL — ABNORMAL HIGH (ref 70–99)
Potassium: 3.2 mEq/L — ABNORMAL LOW (ref 3.5–5.1)
Sodium: 136 mEq/L (ref 135–145)

## 2011-03-30 LAB — GLUCOSE, CAPILLARY
Glucose-Capillary: 135 mg/dL — ABNORMAL HIGH (ref 70–99)
Glucose-Capillary: 191 mg/dL — ABNORMAL HIGH (ref 70–99)
Glucose-Capillary: 197 mg/dL — ABNORMAL HIGH (ref 70–99)

## 2011-03-31 DIAGNOSIS — F333 Major depressive disorder, recurrent, severe with psychotic symptoms: Secondary | ICD-10-CM

## 2011-03-31 LAB — GLUCOSE, CAPILLARY
Glucose-Capillary: 152 mg/dL — ABNORMAL HIGH (ref 70–99)
Glucose-Capillary: 202 mg/dL — ABNORMAL HIGH (ref 70–99)
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
## 2011-03-31 NOTE — Discharge Summary (Signed)
NAME:  Christian [name], LANPHERE NO.:  [id number]  MEDICAL RECORD NO.:  [id number]           PATIENT TYPE:  I  LOCATION:  1418                         FACILITY:  [facility]  PHYSICIAN:  Brendia Sacks, MD    DATE OF BIRTH:  1951/10/01  DATE OF ADMISSION:  03/06/2011 DATE OF DISCHARGE:  03/08/2011                              DISCHARGE SUMMARY   PRIMARY CARE PHYSICIAN:  [name], MD  CONDITION ON DISCHARGE:  Improved.  DISCHARGE DIAGNOSES: 1. Slurred speech with left upper and left lower extremity weakness     secondary to hypoglycemia, resolved. 2. Hypoglycemia secondary to diet, resolved. 3. Hypertension, stable. 4. Hyperlipidemia, stable.  HISTORY OF PRESENT ILLNESS:  This is a 60 year old man with a history of diabetes mellitus type 2 as well as hypertension and hyperlipidemia who presented to [facility] with hypoglycemia and glucose of 40 also with slurred speech and weakness on the left side.  The symptoms resolved and he was given glucose.  He also had an episode apparently earlier in the week.  The patient is a longstanding diabetic, but of note is less 85 pounds intentionally and has been able to come off insulin.  HOSPITAL COURSE: 1. Slurred speech with left upper and left lower extremity weakness,     resolved.  This improved with administration of sugar.  Given his     risk factors, he was admitted to rule out stroke.  Imaging workup     was unremarkable and he has had no recurrence of his symptoms.  No     further evaluation is indicated at this time. 2. Hypoglycemia.  The patient is on glyburide and metformin as an     outpatient.  He works as a [employer]chant [employer] officer and usually will not     eat lunch in fact eating just perhaps breakfast and then a large     dinner.  His hypoglycemia is almost certainly secondary to this     eating behavior.  I discussed with him the need to eat small,     frequent meals and of having snacks on hand both  something in the     form of crackers or something in the form of quick sugars such as     glucose tablets.  I have also discussed this with his wife and     daughter.  I suspect with regular small meals his hypoglycemia will     not recur.  His hemoglobin A1c was quite good at 6.2. 3. Mild renal failure.  Few previous data points, specifically just     one with a creatinine of 1.79 in June 2010.  His admission     creatinine here was 1.8, BUN was 23; on discharge, his creatinine     was 1.15 with BUN 11 and an estimated GFR greater than 60.  At this     point, his creatinine appears to be at baseline and his renal     ultrasound was unremarkable.  He is on a diuretic as well as an ARB     in the outpatient setting which may increase  his physiologic GFR.     I would recommend repeating basic metabolic panel in the outpatient     setting on this.  If his creatinine is 1.5, we would consider     discontinuing his metformin secondary to increased risk of lactic     acidosis.  Of note, however, his creatinine is normal at this     point.4. Hypertension.  This appears to be stable.  He will resume all his     chronic medications. 5. Glaucoma.  This appears to be stable.  Continue Travatan. 6. Hyperlipidemia.  Continue Zocor.  CONSULTATIONS:  None.  PROCEDURES:  None.  IMAGING: 1. CT of the head, April 7th:  Mild chronic microvascular ischemia in     the white matter.  No acute intracranial abnormality. 2. Chest x-ray, April 7th:  Low lung volumes without acute     cardiopulmonary findings. 3. 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  No acute infarct. 4. MRA of the head, April 9th:  Moderately severe intracranial     atherosclerotic disease without large vessel occlusion. 5. Renal ultrasound bilateral, April 9th:  Normal renal ultrasound.     No hydronephrosis.  MICROBIOLOGY:  None.  ANCILLARY STUDIES: 1. Left ventricular ejection fraction 55%  to 60%.  Normal wall motion.     No regional wall motion abnormalities. 2. Preliminary report of bilateral carotid Dopplers were negative for     ICA stenosis bilaterally.  Vertebral artery flow was antegrade     bilaterally. 3. EKG showed sinus rhythm, first-degree AV block, cannot rule out     inferior infarct age unknown.  No old EKG available for comparison.     No acute changes seen.  PERTINENT LABORATORY STUDIES: 1. CBC notable for mild thrombocytopenia with platelet count of 143     and stable.  Hemoglobin/hematocrit within normal limits. White blood cell count was 3.4. 1. Capillary blood sugars well controlled. 2. Basic metabolic panel unremarkable on discharge with a creatinine     of 1.15.  Creatinine was 1.8 on admission. 3. Hemoglobin A1c was 6.2. 4. Cardiac markers negative. 5. Lipid panel notable for an HDL 34, LDL 53. 6. TSH within normal limits. 7. Urine drug screen was positive for benzodiazepines and     amphetamines.  PHYSICAL EXAMINATION:  GENERAL:  On discharge, the patient feels well. He has no complaints at this time. VITAL SIGNS:  Temperature is 98.6, pulse 79, respirations 18, blood pressure 160/88, saturating 98% on room air. NEUROLOGIC:  Nonfocal with excellent strength in the upper and lower extremities.  Speech is fluent and clear. CARDIOVASCULAR:  Regular rate and rhythm.  No murmur, rub, or gallop. RESPIRATORY:  Clear to auscultation bilaterally.  No wheezes, rales, or rhonchi.  DISCHARGE INSTRUCTIONS:  The patient will be discharged home.  Diet is a heart-healthy, diabetic diet.  Activity as tolerated.  DISCHARGE MEDICATIONS: 1. Aspirin 81 mg p.o. daily. 2. Benztropine 1 mg p.o. b.i.d. 3. Colchicine 0.6 mg p.o. b.i.d. 4. Lasix 20 mg p.o. daily. 5. Glyburide/metformin 5/500 two tablets p.o. b.i.d. 6. Lorazepam 1 mg p.o. b.i.d. 7. Metoprolol XL 200 mg one half tablet p.o. b.i.d. 8. Micardis 80 mg p.o. daily. 9. Saxagliptin 5 mg p.o.  daily. 10.Potassium chloride 10 mEq p.o. daily. 11.Ranitidine 300 mg p.o. b.i.d. 12.Simvastatin 40 mg p.o. daily. 13.Healthy joint supplement. 14.Travatan 0.004% 1 drop both eyes nightly, note that this is being  changed to Lumigan. 15.Venlafaxine 100 mg p.o. b.i.d. 16.Verapamil SR 240 mg p.o. daily. 17.Ziprasidone 80 mg p.o. b.i.d. 18.Zolpidem 10 mg p.o. nightly.  Things to follow up in the outpatient setting, 1. Suggest repeat basic metabolic panel, see discussion above. 2. Urine drug screen was positive for benzodiazepines which would be     appropriate given his prescribed medication and also positive for     amphetamines.  I suspect this was spurious in nature.  Would defer     any further evaluation to primary care physician.  Time coordinating discharge 25 minutes.     Brendia Sacks, MD     DG/MEDQ  D:  03/08/2011  T:  03/09/2011  Job:  [id number]  cc:   [name], M.D. Fax: [phone]  Electronically Signed by Brendia Sacks  on 03/31/2011 08:48:33 PM

## 2011-04-01 LAB — COMPREHENSIVE METABOLIC PANEL
ALT: 39 U/L (ref 0–53)
AST: 30 U/L (ref 0–37)
Albumin: 3.3 g/dL — ABNORMAL LOW (ref 3.5–5.2)
Alkaline Phosphatase: 100 U/L (ref 39–117)
BUN: 14 mg/dL (ref 6–23)
CO2: 24 mEq/L (ref 19–32)
Calcium: 9.6 mg/dL (ref 8.4–10.5)
Chloride: 96 mEq/L (ref 96–112)
Creatinine, Ser: 1.54 mg/dL — ABNORMAL HIGH (ref 0.4–1.5)
GFR calc Af Amer: 57 mL/min — ABNORMAL LOW (ref >60)
GFR calc non Af Amer: 47 mL/min — ABNORMAL LOW (ref >60)
Glucose, Bld: 165 mg/dL — ABNORMAL HIGH (ref 70–99)
Potassium: 3.6 mEq/L (ref 3.5–5.1)
Sodium: 135 mEq/L (ref 135–145)
Total Bilirubin: 0.7 mg/dL (ref 0.3–1.2)
Total Protein: 6.7 g/dL (ref 6.0–8.3)

## 2011-04-01 LAB — GLUCOSE, CAPILLARY
Glucose-Capillary: 149 mg/dL — ABNORMAL HIGH (ref 70–99)
Glucose-Capillary: 161 mg/dL — ABNORMAL HIGH (ref 70–99)
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
## 2011-04-02 LAB — GLUCOSE, CAPILLARY
Glucose-Capillary: 152 mg/dL — ABNORMAL HIGH (ref 70–99)
Glucose-Capillary: 138 mg/dL — ABNORMAL HIGH (ref 70–99)

## 2011-04-02 LAB — TSH: TSH: 0.931 u[IU]/mL (ref 0.350–4.500)

## 2011-04-02 LAB — HEMOGLOBIN A1C
Hgb A1c MFr Bld: 7.2 % — ABNORMAL HIGH (ref <5.7)
Mean Plasma Glucose: 160 mg/dL — ABNORMAL HIGH (ref <117)

## 2011-04-02 LAB — T3 UPTAKE: T3 Uptake Ratio: 47.2 % — ABNORMAL HIGH (ref 22.5–37.0)

## 2011-04-02 LAB — T4, FREE: Free T4: 1.56 ng/dL (ref 0.80–1.80)

## 2011-04-03 LAB — GLUCOSE, CAPILLARY
Glucose-Capillary: 132 mg/dL — ABNORMAL HIGH (ref 70–99)
Glucose-Capillary: 166 mg/dL — ABNORMAL HIGH (ref 70–99)
Glucose-Capillary: 181 mg/dL — ABNORMAL HIGH (ref 70–99)
Glucose-Capillary: 244 mg/dL — ABNORMAL HIGH (ref 70–99)

## 2011-04-04 LAB — CBC
HCT: 34.7 % — ABNORMAL LOW (ref 39.0–52.0)
Hemoglobin: 11.9 g/dL — ABNORMAL LOW (ref 13.0–17.0)
MCH: 28.3 pg (ref 26.0–34.0)
MCHC: 34.3 g/dL (ref 30.0–36.0)
MCV: 82.6 fL (ref 78.0–100.0)
Platelets: 208 10*3/uL (ref 150–400)
RBC: 4.2 MIL/uL — ABNORMAL LOW (ref 4.22–5.81)
RDW: 13.2 % (ref 11.5–15.5)
WBC: 4.6 10*3/uL (ref 4.0–10.5)

## 2011-04-04 LAB — GLUCOSE, CAPILLARY
Glucose-Capillary: 154 mg/dL — ABNORMAL HIGH (ref 70–99)
Glucose-Capillary: 200 mg/dL — ABNORMAL HIGH (ref 70–99)
Glucose-Capillary: 241 mg/dL — ABNORMAL HIGH (ref 70–99)
Glucose-Capillary: 301 mg/dL — ABNORMAL HIGH (ref 70–99)

## 2011-04-04 LAB — COMPREHENSIVE METABOLIC PANEL
ALT: 32 U/L (ref 0–53)
AST: 26 U/L (ref 0–37)
Albumin: 3 g/dL — ABNORMAL LOW (ref 3.5–5.2)
Alkaline Phosphatase: 89 U/L (ref 39–117)
BUN: 8 mg/dL (ref 6–23)
CO2: 28 mEq/L (ref 19–32)
Calcium: 9.1 mg/dL (ref 8.4–10.5)
Chloride: 96 mEq/L (ref 96–112)
Creatinine, Ser: 1.23 mg/dL (ref 0.4–1.5)
GFR calc Af Amer: >60 mL/min (ref >60)
GFR calc non Af Amer: >60 mL/min (ref >60)
Glucose, Bld: 168 mg/dL — ABNORMAL HIGH (ref 70–99)
Potassium: 3.6 mEq/L (ref 3.5–5.1)
Sodium: 133 mEq/L — ABNORMAL LOW (ref 135–145)
Total Bilirubin: 0.5 mg/dL (ref 0.3–1.2)
Total Protein: 5.9 g/dL — ABNORMAL LOW (ref 6.0–8.3)
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
## 2011-04-05 LAB — GLUCOSE, CAPILLARY
Glucose-Capillary: 153 mg/dL — ABNORMAL HIGH (ref 70–99)
Glucose-Capillary: 246 mg/dL — ABNORMAL HIGH (ref 70–99)
Glucose-Capillary: 257 mg/dL — ABNORMAL HIGH (ref 70–99)

## 2011-04-06 LAB — GLUCOSE, CAPILLARY
Glucose-Capillary: 155 mg/dL — ABNORMAL HIGH (ref 70–99)
Glucose-Capillary: 202 mg/dL — ABNORMAL HIGH (ref 70–99)
Glucose-Capillary: 218 mg/dL — ABNORMAL HIGH (ref 70–99)
Glucose-Capillary: 212 mg/dL — ABNORMAL HIGH (ref 70–99)

## 2011-04-08 LAB — GLUCOSE, CAPILLARY
Glucose-Capillary: 179 mg/dL — ABNORMAL HIGH (ref 70–99)
Glucose-Capillary: 206 mg/dL — ABNORMAL HIGH (ref 70–99)

## 2011-04-09 LAB — GLUCOSE, CAPILLARY
Glucose-Capillary: 171 mg/dL — ABNORMAL HIGH (ref 70–99)
Glucose-Capillary: 219 mg/dL — ABNORMAL HIGH (ref 70–99)
Glucose-Capillary: 243 mg/dL — ABNORMAL HIGH (ref 70–99)
Glucose-Capillary: 158 mg/dL — ABNORMAL HIGH (ref 70–99)

## 2011-04-10 LAB — GLUCOSE, CAPILLARY
Glucose-Capillary: 113 mg/dL — ABNORMAL HIGH (ref 70–99)
Glucose-Capillary: 242 mg/dL — ABNORMAL HIGH (ref 70–99)

## 2011-04-11 LAB — GLUCOSE, CAPILLARY
Glucose-Capillary: 182 mg/dL — ABNORMAL HIGH (ref 70–99)
Glucose-Capillary: 221 mg/dL — ABNORMAL HIGH (ref 70–99)
Glucose-Capillary: 170 mg/dL — ABNORMAL HIGH (ref 70–99)
Glucose-Capillary: 202 mg/dL — ABNORMAL HIGH (ref 70–99)
Glucose-Capillary: 208 mg/dL — ABNORMAL HIGH (ref 70–99)
Glucose-Capillary: 199 mg/dL — ABNORMAL HIGH (ref 70–99)
Glucose-Capillary: 191 mg/dL — ABNORMAL HIGH (ref 70–99)
Glucose-Capillary: 190 mg/dL — ABNORMAL HIGH (ref 70–99)
Glucose-Capillary: 139 mg/dL — ABNORMAL HIGH (ref 70–99)
Glucose-Capillary: 161 mg/dL — ABNORMAL HIGH (ref 70–99)
Glucose-Capillary: 200 mg/dL — ABNORMAL HIGH (ref 70–99)
Glucose-Capillary: 176 mg/dL — ABNORMAL HIGH (ref 70–99)
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
## 2011-04-12 LAB — GLUCOSE, CAPILLARY
Glucose-Capillary: 106 mg/dL — ABNORMAL HIGH (ref 70–99)
Glucose-Capillary: 203 mg/dL — ABNORMAL HIGH (ref 70–99)
Glucose-Capillary: 154 mg/dL — ABNORMAL HIGH (ref 70–99)
Glucose-Capillary: 155 mg/dL — ABNORMAL HIGH (ref 70–99)

## 2011-04-13 LAB — GLUCOSE, CAPILLARY
Glucose-Capillary: 112 mg/dL — ABNORMAL HIGH (ref 70–99)
Glucose-Capillary: 168 mg/dL — ABNORMAL HIGH (ref 70–99)
Glucose-Capillary: 154 mg/dL — ABNORMAL HIGH (ref 70–99)

## 2011-04-14 LAB — GLUCOSE, CAPILLARY
Glucose-Capillary: 125 mg/dL — ABNORMAL HIGH (ref 70–99)
Glucose-Capillary: 101 mg/dL — ABNORMAL HIGH (ref 70–99)
Glucose-Capillary: 160 mg/dL — ABNORMAL HIGH (ref 70–99)
Glucose-Capillary: 132 mg/dL — ABNORMAL HIGH (ref 70–99)
Glucose-Capillary: 172 mg/dL — ABNORMAL HIGH (ref 70–99)

## 2011-04-15 LAB — GLUCOSE, CAPILLARY
Glucose-Capillary: 97 mg/dL (ref 70–99)
Glucose-Capillary: 161 mg/dL — ABNORMAL HIGH (ref 70–99)
Glucose-Capillary: 111 mg/dL — ABNORMAL HIGH (ref 70–99)
Glucose-Capillary: 160 mg/dL — ABNORMAL HIGH (ref 70–99)

## 2011-04-16 LAB — GLUCOSE, CAPILLARY
Glucose-Capillary: 117 mg/dL — ABNORMAL HIGH (ref 70–99)
Glucose-Capillary: 154 mg/dL — ABNORMAL HIGH (ref 70–99)

## 2011-04-16 NOTE — Procedures (Signed)
NAME:  Christian [name], Christian [name]            ACCOUNT NO.:  [id number]   MEDICAL RECORD NO.:  [id number]          PATIENT TYPE:  OUT   LOCATION:  SLEEP CENTER                 FACILITY:  [facility]   PHYSICIAN:  Clinton D. Maple Hudson, M.D. DATE OF BIRTH:  [birth year]   DATE OF ADMISSION:  07/19/2004  DATE OF DISCHARGE:  07/19/2004                              NOCTURNAL POLYSOMNOGRAM   REFERRING PHYSICIAN:  Dr. Elias Else   INDICATION FOR STUDY:  Hypersomnia with sleep apnea.   EPWORTH SLEEPINESS SCORE:  21/24   NECK SIZE:  19 inches   MEDICATION LIST:  Zocor, clonazepam, Effexor, ranitidine, benazepril,  glyburide/metformin, Toprol XL, Risperdal, verapamil, Avandia, Micardis HCT,  Lantus, Catapres-TTS patch.   SLEEP ARCHITECTURE:  Total sleep time 428 minutes.  Stage I was 2%, Stage II  83%, Stages III and IV were absent, REM was 15% of total sleep time.  Sleep  efficiency was 90%.  Latency to sleep onset 23 minutes, latency to REM 204  minutes, awake after sleep onset 25 minutes, arousal index 8/hr.   RESPIRATORY DATA:  Split-study protocol.  Moderately severe obstructive  sleep apnea/hypopnea syndrome, RDI 51/hr before CPAP.  This included 49  obstructive apneas, 68 hypopneas.  Events were not positional.  REM RDI  0.9/hr.  CPAP was titrated to 11 CWP, RDI 0.6/hr using a large ComfortGel  Mask with heated humidifier, well tolerated.   OXYGEN DATA:  Very loud snoring before CPAP with moderate oxygen  desaturation to 75% with apneas.  After CPAP titration, oxygen saturation  held 93-95%.   CARDIAC DATA:  Normal sinus rhythm with occasional PVC.   MOVEMENT/PARASOMNIA:  Two hundred seventy eight limb jerks were recorded of  which 8 were associated with arousal or awakening for a periodic limb  movement with arousal index of 1.1/hr which is probably not significant.   IMPRESSION/RECOMMENDATION:  Moderately severe obstructive sleep  apnea/hypopnea syndrome, respiratory disturbance index 51/hr  with  desaturation to 75%.  Good continuous positive airway pressure control at 11  CWP, respiratory disturbance index 0.6/hr, using a large ComfortGel Mask  with heated humidifier.                                   ______________________________                                Rennis Chris Maple Hudson, M.D.                                Diplomate, Biomedical engineer of Sleep Medicine    CDY/MEDQ  D:  07/26/2004 08:52:02  T:  07/27/2004 08:18:45  Job:  [id number]
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
## 2013-05-01 ENCOUNTER — Encounter (HOSPITAL_BASED_OUTPATIENT_CLINIC_OR_DEPARTMENT_OTHER): Payer: BC Managed Care – PPO | Attending: General Surgery

## 2013-05-01 DIAGNOSIS — L97509 Non-pressure chronic ulcer of other part of unspecified foot with unspecified severity: Secondary | ICD-10-CM | POA: Insufficient documentation

## 2013-05-01 DIAGNOSIS — E1169 Type 2 diabetes mellitus with other specified complication: Secondary | ICD-10-CM | POA: Insufficient documentation

## 2013-05-01 DIAGNOSIS — E669 Obesity, unspecified: Secondary | ICD-10-CM | POA: Insufficient documentation

## 2013-05-01 DIAGNOSIS — I10 Essential (primary) hypertension: Secondary | ICD-10-CM | POA: Insufficient documentation

## 2013-05-02 NOTE — Progress Notes (Signed)
[facility]  NAME:  Christian [name], Christian [name] NO.:  [id number]  MEDICAL RECORD NO.:  [id number]      DATE OF BIRTH:  Dec 01, 1950  PHYSICIAN:  Ardath Sax, M.D.           VISIT DATE:                                  OFFICE VISIT   HISTORY:  This is a 62 year old black man, who has had diabetes for about 20 years.  He treats this with glipizide 10 mg every 12 hours.  He also is on many other medicines including Januvia, Travatan, Flomax, ranitidine, Lipitor, potassium chloride, Micardis, Lantus insulin 60 units subcutaneous once a day, Lasix 20 mg a day, Ritalin 5 a day, Abilify at 10 mg a day, vitamins, benztropine, and Toprol-XL every 24 hours 200 mg.  He has a diabetic foot ulcer, Wagner 2 on the volar aspect of his right second toe, it is about 5 mm in diameter, and we are going to treat this with toe sock and we will put on a small piece of collagen to cover the wound.  This gentleman weighs 325 pounds.  His blood pressure is 166/95, pulse 76, temperature 99 degrees.  His blood sugars 177.  He will come back in a week.  I do not think he will need hyperbaric oxygen treatments.  I think a collagen should take care of this.  He has got palpable pulses in his foot.  DIAGNOSES:  Type 2 diabetes, obesity, hypertension, and Wagner 2 diabetic foot ulcer on the volar aspect of his right second toe.     Ardath Sax, M.D.     PP/MEDQ  D:  05/01/2013  T:  05/02/2013  Job:  [id number]

## 2013-05-29 ENCOUNTER — Encounter (HOSPITAL_BASED_OUTPATIENT_CLINIC_OR_DEPARTMENT_OTHER): Payer: BC Managed Care – PPO | Attending: General Surgery

## 2014-05-24 ENCOUNTER — Encounter (HOSPITAL_COMMUNITY): Payer: Self-pay | Admitting: Emergency Medicine

## 2014-05-24 ENCOUNTER — Emergency Department (HOSPITAL_COMMUNITY)
Admission: EM | Admit: 2014-05-24 | Discharge: 2014-05-25 | Disposition: A | Discharge status: Home / Self Care | Payer: BC Managed Care – PPO | Attending: Emergency Medicine | Admitting: Emergency Medicine

## 2014-05-24 DIAGNOSIS — F3289 Other specified depressive episodes: Secondary | ICD-10-CM | POA: Insufficient documentation

## 2014-05-24 DIAGNOSIS — M109 Gout, unspecified: Secondary | ICD-10-CM | POA: Insufficient documentation

## 2014-05-24 DIAGNOSIS — F3110 Bipolar disorder, current episode manic without psychotic features, unspecified: Secondary | ICD-10-CM | POA: Diagnosis present

## 2014-05-24 DIAGNOSIS — F32A Depression, unspecified: Secondary | ICD-10-CM

## 2014-05-24 DIAGNOSIS — F329 Major depressive disorder, single episode, unspecified: Secondary | ICD-10-CM | POA: Insufficient documentation

## 2014-05-24 DIAGNOSIS — F411 Generalized anxiety disorder: Secondary | ICD-10-CM | POA: Insufficient documentation

## 2014-05-24 DIAGNOSIS — F3113 Bipolar disorder, current episode manic without psychotic features, severe: Secondary | ICD-10-CM

## 2014-05-24 DIAGNOSIS — I10 Essential (primary) hypertension: Secondary | ICD-10-CM | POA: Insufficient documentation

## 2014-05-24 DIAGNOSIS — E119 Type 2 diabetes mellitus without complications: Secondary | ICD-10-CM | POA: Insufficient documentation

## 2014-05-24 DIAGNOSIS — Z79899 Other long term (current) drug therapy: Secondary | ICD-10-CM | POA: Insufficient documentation

## 2014-05-24 DIAGNOSIS — R45850 Homicidal ideations: Secondary | ICD-10-CM

## 2014-05-24 DIAGNOSIS — R739 Hyperglycemia, unspecified: Secondary | ICD-10-CM

## 2014-05-24 HISTORY — DX: Emotional lability: R45.86

## 2014-05-24 HISTORY — DX: Essential (primary) hypertension: I10

## 2014-05-24 HISTORY — DX: Gout, unspecified: M10.9

## 2014-05-24 LAB — RAPID URINE DRUG SCREEN, HOSP PERFORMED
Amphetamines: NOT DETECTED
Barbiturates: NOT DETECTED
Benzodiazepines: NOT DETECTED
Cocaine: NOT DETECTED
Opiates: NOT DETECTED
Tetrahydrocannabinol: NOT DETECTED

## 2014-05-24 LAB — COMPREHENSIVE METABOLIC PANEL
ALT: 59 U/L — ABNORMAL HIGH (ref 0–53)
AST: 51 U/L — ABNORMAL HIGH (ref 0–37)
Albumin: 4.1 g/dL (ref 3.5–5.2)
Alkaline Phosphatase: 125 U/L — ABNORMAL HIGH (ref 39–117)
BUN: 27 mg/dL — ABNORMAL HIGH (ref 6–23)
CO2: 28 mEq/L (ref 19–32)
Calcium: 9.6 mg/dL (ref 8.4–10.5)
Chloride: 96 mEq/L (ref 96–112)
Creatinine, Ser: 1.69 mg/dL — ABNORMAL HIGH (ref 0.50–1.35)
GFR calc Af Amer: 49 mL/min — ABNORMAL LOW (ref >90)
GFR calc non Af Amer: 42 mL/min — ABNORMAL LOW (ref >90)
Glucose, Bld: 337 mg/dL — ABNORMAL HIGH (ref 70–99)
Potassium: 4.1 mEq/L (ref 3.7–5.3)
Sodium: 137 mEq/L (ref 137–147)
Total Bilirubin: 0.5 mg/dL (ref 0.3–1.2)
Total Protein: 8.4 g/dL — ABNORMAL HIGH (ref 6.0–8.3)

## 2014-05-24 LAB — CBC
HCT: 43.3 % (ref 39.0–52.0)
Hemoglobin: 14.6 g/dL (ref 13.0–17.0)
MCH: 29.6 pg (ref 26.0–34.0)
MCHC: 33.7 g/dL (ref 30.0–36.0)
MCV: 87.7 fL (ref 78.0–100.0)
Platelets: 190 10*3/uL (ref 150–400)
RBC: 4.94 MIL/uL (ref 4.22–5.81)
RDW: 13 % (ref 11.5–15.5)
WBC: 5.7 10*3/uL (ref 4.0–10.5)

## 2014-05-24 LAB — ETHANOL: Alcohol, Ethyl (B): <11 mg/dL (ref 0–11)

## 2014-05-24 LAB — CBG MONITORING, ED
Glucose-Capillary: 263 mg/dL — ABNORMAL HIGH (ref 70–99)
Glucose-Capillary: 213 mg/dL — ABNORMAL HIGH (ref 70–99)
Glucose-Capillary: 183 mg/dL — ABNORMAL HIGH (ref 70–99)

## 2014-05-24 LAB — ACETAMINOPHEN LEVEL: Acetaminophen (Tylenol), Serum: <15 ug/mL (ref 10–30)

## 2014-05-24 LAB — SALICYLATE LEVEL: Salicylate Lvl: <2 mg/dL — ABNORMAL LOW (ref 2.8–20.0)

## 2014-05-24 MED ORDER — NICOTINE 21 MG/24HR TD PT24: 21.0000 mg | MEDICATED_PATCH | Freq: Every day | TRANSDERMAL | Status: DC

## 2014-05-24 MED ORDER — LORAZEPAM 0.5 MG PO TABS
0.5000 mg | ORAL_TABLET | Freq: Two times a day (BID) | ORAL | Status: DC
  Administered 2014-05-24: 0.5 mg via ORAL
  Filled 2014-05-24: qty 1

## 2014-05-24 MED ORDER — LORAZEPAM 1 MG PO TABS
1.0000 mg | ORAL_TABLET | Freq: Three times a day (TID) | ORAL | Status: DC | PRN
Start: 2014-05-24 — End: 2014-05-24
  Administered 2014-05-24: 1 mg via ORAL
  Filled 2014-05-24: qty 1

## 2014-05-24 MED ORDER — LORAZEPAM 1 MG PO TABS
1.0000 mg | ORAL_TABLET | Freq: Four times a day (QID) | ORAL | Status: DC | PRN
  Administered 2014-05-24: 1 mg via ORAL
  Filled 2014-05-24: qty 1

## 2014-05-24 MED ORDER — FUROSEMIDE 40 MG PO TABS
40.0000 mg | ORAL_TABLET | Freq: Every day | ORAL | Status: DC
  Filled 2014-05-24: qty 1

## 2014-05-24 MED ORDER — METOPROLOL SUCCINATE ER 100 MG PO TB24
200.0000 mg | ORAL_TABLET | Freq: Every day | ORAL | Status: DC
  Administered 2014-05-24: 200 mg via ORAL
  Filled 2014-05-24 (×2): qty 2

## 2014-05-24 MED ORDER — ONDANSETRON HCL 4 MG PO TABS: 4.0000 mg | ORAL_TABLET | Freq: Three times a day (TID) | ORAL | Status: DC | PRN

## 2014-05-24 MED ORDER — LOSARTAN POTASSIUM 50 MG PO TABS
100.0000 mg | ORAL_TABLET | Freq: Every day | ORAL | Status: DC
  Administered 2014-05-24: 100 mg via ORAL
  Filled 2014-05-24 (×2): qty 2

## 2014-05-24 MED ORDER — FUROSEMIDE 80 MG PO TABS
80.0000 mg | ORAL_TABLET | Freq: Every day | ORAL | Status: DC
  Filled 2014-05-24 (×2): qty 1

## 2014-05-24 MED ORDER — GLIPIZIDE ER 10 MG PO TB24
10.0000 mg | ORAL_TABLET | Freq: Two times a day (BID) | ORAL | Status: DC
  Administered 2014-05-24 (×2): 10 mg via ORAL
  Filled 2014-05-24 (×6): qty 1

## 2014-05-24 MED ORDER — LINAGLIPTIN 5 MG PO TABS
5.0000 mg | ORAL_TABLET | Freq: Every day | ORAL | Status: DC
  Administered 2014-05-24: 5 mg via ORAL
  Filled 2014-05-24 (×2): qty 1

## 2014-05-24 MED ORDER — ALLOPURINOL 300 MG PO TABS
300.0000 mg | ORAL_TABLET | Freq: Every day | ORAL | Status: DC
  Administered 2014-05-24: 300 mg via ORAL
  Filled 2014-05-24 (×2): qty 1

## 2014-05-24 MED ORDER — HALOPERIDOL 5 MG PO TABS
5.0000 mg | ORAL_TABLET | Freq: Three times a day (TID) | ORAL | Status: DC | PRN
  Administered 2014-05-24: 5 mg via ORAL
  Filled 2014-05-24: qty 1

## 2014-05-24 MED ORDER — IBUPROFEN 200 MG PO TABS: 600.0000 mg | ORAL_TABLET | Freq: Three times a day (TID) | ORAL | Status: DC | PRN

## 2014-05-24 MED ORDER — QUETIAPINE FUMARATE 300 MG PO TABS
300.0000 mg | ORAL_TABLET | Freq: Two times a day (BID) | ORAL | Status: DC
  Administered 2014-05-24 (×2): 300 mg via ORAL
  Filled 2014-05-24 (×2): qty 1

## 2014-05-24 MED ORDER — DIVALPROEX SODIUM 500 MG PO DR TAB
500.0000 mg | DELAYED_RELEASE_TABLET | Freq: Two times a day (BID) | ORAL | Status: DC
  Administered 2014-05-24 (×2): 500 mg via ORAL
  Filled 2014-05-24 (×2): qty 1

## 2014-05-24 MED ORDER — SODIUM CHLORIDE 0.9 % IV BOLUS (SEPSIS)
1000.0000 mL | Freq: Once | INTRAVENOUS | Status: AC
  Administered 2014-05-24: 1000 mL via INTRAVENOUS

## 2014-05-24 MED ORDER — FUROSEMIDE 40 MG PO TABS
40.0000 mg | ORAL_TABLET | Freq: Two times a day (BID) | ORAL | Status: DC
  Administered 2014-05-24: 40 mg via ORAL
  Filled 2014-05-24: qty 2
  Filled 2014-05-24: qty 1

## 2014-05-24 MED ORDER — FUROSEMIDE 40 MG PO TABS
40.0000 mg | ORAL_TABLET | Freq: Two times a day (BID) | ORAL | Status: DC
  Filled 2014-05-24 (×2): qty 2

## 2014-05-24 MED ORDER — ATORVASTATIN CALCIUM 10 MG PO TABS
10.0000 mg | ORAL_TABLET | Freq: Every day | ORAL | Status: DC
  Administered 2014-05-24: 10 mg via ORAL
  Filled 2014-05-24 (×3): qty 1

## 2014-05-24 MED ORDER — COLCHICINE 0.6 MG PO TABS
0.6000 mg | ORAL_TABLET | Freq: Every day | ORAL | Status: DC
  Administered 2014-05-24: 0.6 mg via ORAL
  Filled 2014-05-24 (×2): qty 1

## 2014-05-24 MED ORDER — ZOLPIDEM TARTRATE 5 MG PO TABS: 5.0000 mg | ORAL_TABLET | Freq: Every evening | ORAL | Status: DC | PRN
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
## 2014-05-24 NOTE — ED Notes (Signed)
Since shift change pt has written on paper his thoughts, writing he is in "jail without bail." Pt calm with staff but reports he "should not be locked up, son in law should. "

## 2014-05-24 NOTE — ED Notes (Signed)
Patient requested additional paper and pencil, states he is writing a letter. Additional paper and pencil given.

## 2014-05-24 NOTE — ED Notes (Signed)
Patient talking very loudly, spelling words out. Statements and words are unclear.

## 2014-05-24 NOTE — ED Provider Notes (Signed)
CSN: [id number]     Arrival date & time 05/24/14  0047 History   First MD Initiated Contact with Patient 05/24/14 0355     Chief Complaint  Patient presents with  . Psychiatric Evaluation    IVC'd by family   HPI  History provided by the patient and IVC papers. Patient is a 63 year old male with history of depression, hypertension, diabetes and gout who presents under IVC papers for concerns of harm to himself and others. Patient states that his wife took out papers because he became very angry at his son-in-law. He does admit that he felt so angry he was worried he may hurt him. He currently denies any SI. He denies having any drug or alcohol use. Denies any hallucinations. Patient does have similar past episodes and histories of mood swings. Denies any changes in his medications. No other aggravating or alleviating factors. No other associated symptoms.   Past Medical History  Diagnosis Date  . Gout   . Hypertension   . Depression   . Mood swings    History reviewed. No pertinent past surgical history. No family history on file. History  Substance Use Topics  . Smoking status: Never Smoker   . Smokeless tobacco: Not on file  . Alcohol Use: No    Review of Systems  All other systems reviewed and are negative.     Allergies  Review of patient's allergies indicates no known allergies.  Home Medications   Prior to Admission medications   Medication Sig Start Date End Date Taking? Authorizing Provider  colchicine 0.6 MG tablet Take 0.6 mg by mouth daily.   Yes Historical Provider, MD  FUROSEMIDE PO Take 1 tablet by mouth daily.   Yes Historical Provider, MD  linagliptin (TRADJENTA) 5 MG TABS tablet Take 5 mg by mouth daily.   Yes Historical Provider, MD  QUEtiapine Fumarate (SEROQUEL PO) Take 1 tablet by mouth at bedtime.   Yes Historical Provider, MD   BP 164/82  Pulse 90  Temp(Src) 99.2 F (37.3 C) (Oral)  Resp 18  SpO2 98% Physical Exam  Nursing note and vitals  reviewed. Constitutional: He is oriented to person, place, and time. He appears well-developed and well-nourished. No distress.  HENT:  Head: Normocephalic.  Eyes: Conjunctivae are normal.  Cardiovascular: Normal rate and regular rhythm.   Pulmonary/Chest: Effort normal and breath sounds normal. No respiratory distress. He has no wheezes.  Abdominal: Soft.  Neurological: He is alert and oriented to person, place, and time.  Skin: Skin is warm.  Psychiatric: His behavior is normal. Thought content normal. His mood appears anxious. His speech is rapid and/or pressured. He expresses no suicidal ideation.    ED Course  Procedures   COORDINATION OF CARE:  Nursing notes reviewed. Vital signs reviewed. Initial pt interview and examination performed.   Filed Vitals:   05/24/14 0108 05/24/14 0223  BP: 184/108 164/82  Pulse: 107 90  Temp: 100 F (37.8 C) 99.2 F (37.3 C)  TempSrc: Oral Oral  Resp: 18   SpO2: 96% 98%    3:57 AM-patient seen and evaluated. The patient talking rapidly mostly about his son-in-law. He does report story that he was becoming very angry with his son-in-law and was worried that he may hurt him. Denies any SI.  Patient does have elevated blood sugar. Will give IV fluids and recheck CBG. Patient is otherwise medically cleared for further psychiatric evaluation. He does have IVC papers.  Psychiatric holding orders in place. TTS consult placed.  Results for orders placed during the hospital encounter of 05/24/14  ACETAMINOPHEN LEVEL      Result Value Ref Range   Acetaminophen (Tylenol), Serum <15.0  10 - 30 ug/mL  CBC      Result Value Ref Range   WBC 5.7  4.0 - 10.5 K/uL   RBC 4.94  4.22 - 5.81 MIL/uL   Hemoglobin 14.6  13.0 - 17.0 g/dL   HCT 16.143.3  09.639.0 - 04.552.0 %   MCV 87.7  78.0 - 100.0 fL   MCH 29.6  26.0 - 34.0 pg   MCHC 33.7  30.0 - 36.0 g/dL   RDW 40.913.0  81.111.5 - 91.415.5 %   Platelets 190  150 - 400 K/uL  COMPREHENSIVE METABOLIC PANEL      Result  Value Ref Range   Sodium 137  137 - 147 mEq/L   Potassium 4.1  3.7 - 5.3 mEq/L   Chloride 96  96 - 112 mEq/L   CO2 28  19 - 32 mEq/L   Glucose, Bld 337 (*) 70 - 99 mg/dL   BUN 27 (*) 6 - 23 mg/dL   Creatinine, Ser 7.821.69 (*) 0.50 - 1.35 mg/dL   Calcium 9.6  8.4 - 95.610.5 mg/dL   Total Protein 8.4 (*) 6.0 - 8.3 g/dL   Albumin 4.1  3.5 - 5.2 g/dL   AST 51 (*) 0 - 37 U/L   ALT 59 (*) 0 - 53 U/L   Alkaline Phosphatase 125 (*) 39 - 117 U/L   Total Bilirubin 0.5  0.3 - 1.2 mg/dL   GFR calc non Af Amer 42 (*) >90 mL/min   GFR calc Af Amer 49 (*) >90 mL/min  ETHANOL      Result Value Ref Range   Alcohol, Ethyl (B) <11  0 - 11 mg/dL  SALICYLATE LEVEL      Result Value Ref Range   Salicylate Lvl <2.0 (*) 2.8 - 20.0 mg/dL  URINE RAPID DRUG SCREEN (HOSP PERFORMED)      Result Value Ref Range   Opiates NONE DETECTED  NONE DETECTED   Cocaine NONE DETECTED  NONE DETECTED   Benzodiazepines NONE DETECTED  NONE DETECTED   Amphetamines NONE DETECTED  NONE DETECTED   Tetrahydrocannabinol NONE DETECTED  NONE DETECTED   Barbiturates NONE DETECTED  NONE DETECTED    MDM   Final diagnoses:  Depression  Hyperglycemia        and  Angus Sellereter S Zayra Devito, PA-C 05/24/14 [phone]
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
## 2014-05-24 NOTE — ED Notes (Signed)
Pt allowed one phone call to wife Gloriajean Dell.

## 2014-05-24 NOTE — ED Notes (Signed)
[employer] in to speak with patient and discuss plan of care with patient.

## 2014-05-24 NOTE — ED Notes (Signed)
Pt behavior is escalating. Pt is irritable and in the hallway yelling demanding to speak with his wife to pick him up.

## 2014-05-24 NOTE — ED Notes (Signed)
PA at bedside.

## 2014-05-24 NOTE — ED Notes (Signed)
Patient states "I'm not crazy, I'm mad." Patient states she sees Dr Andee Poles, MD. Patient states his doctor is out of town and he is waiting on her to come back in so he can get his medications regulated. Patient states he didn't take his medication as it was ordered because he didn't want to go to sleep at 2000.
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
## 2014-05-24 NOTE — ED Notes (Signed)
Patient arrives with [employer]. Patient is IVC'd by his wife. Patient at risk for harming himself. Patient overheard by [employer] stating to someone via telephone "I am just gonna tell the staff what they want to hear so I can get outta here".

## 2014-05-24 NOTE — ED Notes (Signed)
Patient given drink. 

## 2014-05-24 NOTE — ED Notes (Signed)
Security called to wand patient 

## 2014-05-24 NOTE — ED Notes (Addendum)
Personally spoke with patient's wife Gloriajean Dell per patient's request. Wife is having car problems and will "not be here for awhile." Is aware patient will be kept here for evaluation and for secure holding. Patient aware of this.

## 2014-05-25 ENCOUNTER — Encounter (HOSPITAL_COMMUNITY): Payer: Self-pay

## 2014-05-25 ENCOUNTER — Inpatient Hospital Stay (HOSPITAL_COMMUNITY)
Admission: AD | Admit: 2014-05-25 | Discharge: 2014-06-05 | DRG: 885 | Disposition: A | Discharge status: Home / Self Care | Payer: Federal, State, Local not specified - Other | Source: Intra-hospital | Attending: Psychiatry | Admitting: Psychiatry

## 2014-05-25 DIAGNOSIS — Z91199 Patient's noncompliance with other medical treatment and regimen due to unspecified reason: Secondary | ICD-10-CM

## 2014-05-25 DIAGNOSIS — Z7982 Long term (current) use of aspirin: Secondary | ICD-10-CM

## 2014-05-25 DIAGNOSIS — F3112 Bipolar disorder, current episode manic without psychotic features, moderate: Secondary | ICD-10-CM

## 2014-05-25 DIAGNOSIS — IMO0002 Reserved for concepts with insufficient information to code with codable children: Secondary | ICD-10-CM

## 2014-05-25 DIAGNOSIS — E1165 Type 2 diabetes mellitus with hyperglycemia: Secondary | ICD-10-CM

## 2014-05-25 DIAGNOSIS — F3113 Bipolar disorder, current episode manic without psychotic features, severe: Secondary | ICD-10-CM

## 2014-05-25 DIAGNOSIS — G4733 Obstructive sleep apnea (adult) (pediatric): Secondary | ICD-10-CM | POA: Diagnosis present

## 2014-05-25 DIAGNOSIS — I10 Essential (primary) hypertension: Secondary | ICD-10-CM | POA: Diagnosis present

## 2014-05-25 DIAGNOSIS — IMO0001 Reserved for inherently not codable concepts without codable children: Secondary | ICD-10-CM | POA: Diagnosis present

## 2014-05-25 DIAGNOSIS — F322 Major depressive disorder, single episode, severe without psychotic features: Secondary | ICD-10-CM | POA: Diagnosis present

## 2014-05-25 DIAGNOSIS — Z9989 Dependence on other enabling machines and devices: Secondary | ICD-10-CM

## 2014-05-25 DIAGNOSIS — I872 Venous insufficiency (chronic) (peripheral): Secondary | ICD-10-CM | POA: Diagnosis present

## 2014-05-25 DIAGNOSIS — Z794 Long term (current) use of insulin: Secondary | ICD-10-CM | POA: Diagnosis not present

## 2014-05-25 DIAGNOSIS — R600 Localized edema: Secondary | ICD-10-CM | POA: Diagnosis present

## 2014-05-25 DIAGNOSIS — G4700 Insomnia, unspecified: Secondary | ICD-10-CM | POA: Diagnosis present

## 2014-05-25 DIAGNOSIS — M109 Gout, unspecified: Secondary | ICD-10-CM | POA: Diagnosis present

## 2014-05-25 DIAGNOSIS — E118 Type 2 diabetes mellitus with unspecified complications: Secondary | ICD-10-CM | POA: Diagnosis present

## 2014-05-25 DIAGNOSIS — Z9119 Patient's noncompliance with other medical treatment and regimen: Secondary | ICD-10-CM | POA: Diagnosis not present

## 2014-05-25 DIAGNOSIS — E785 Hyperlipidemia, unspecified: Secondary | ICD-10-CM | POA: Diagnosis present

## 2014-05-25 DIAGNOSIS — F3110 Bipolar disorder, current episode manic without psychotic features, unspecified: Secondary | ICD-10-CM | POA: Diagnosis present

## 2014-05-25 HISTORY — DX: Type 2 diabetes mellitus with hyperglycemia: E11.65

## 2014-05-25 HISTORY — DX: Localized edema: R60.0

## 2014-05-25 HISTORY — DX: Dependence on other enabling machines and devices: Z99.89

## 2014-05-25 HISTORY — DX: Hyperlipidemia, unspecified: E78.5

## 2014-05-25 HISTORY — DX: Type 2 diabetes mellitus with unspecified complications: E11.8

## 2014-05-25 HISTORY — DX: Obstructive sleep apnea (adult) (pediatric): G47.33

## 2014-05-25 LAB — GLUCOSE, CAPILLARY
Glucose-Capillary: 349 mg/dL — ABNORMAL HIGH (ref 70–99)
Glucose-Capillary: 289 mg/dL — ABNORMAL HIGH (ref 70–99)

## 2014-05-25 MED ORDER — DIVALPROEX SODIUM 500 MG PO DR TAB
500.0000 mg | DELAYED_RELEASE_TABLET | Freq: Two times a day (BID) | ORAL | Status: DC
  Administered 2014-05-25 – 2014-05-30 (×10): 500 mg via ORAL
  Filled 2014-05-25 (×15): qty 1

## 2014-05-25 MED ORDER — FUROSEMIDE 80 MG PO TABS
80.0000 mg | ORAL_TABLET | Freq: Every day | ORAL | Status: DC
  Administered 2014-05-25 – 2014-06-05 (×12): 80 mg via ORAL
  Filled 2014-05-25: qty 2
  Filled 2014-05-25: qty 1
  Filled 2014-05-25: qty 2
  Filled 2014-05-25: qty 1
  Filled 2014-05-25: qty 2
  Filled 2014-05-25 (×3): qty 1
  Filled 2014-05-25: qty 4
  Filled 2014-05-25 (×7): qty 1
  Filled 2014-05-25: qty 2
  Filled 2014-05-25 (×2): qty 1

## 2014-05-25 MED ORDER — ALLOPURINOL 300 MG PO TABS
300.0000 mg | ORAL_TABLET | Freq: Every day | ORAL | Status: DC
  Administered 2014-05-25 – 2014-06-05 (×12): 300 mg via ORAL
  Filled 2014-05-25 (×14): qty 1

## 2014-05-25 MED ORDER — ATORVASTATIN CALCIUM 10 MG PO TABS
10.0000 mg | ORAL_TABLET | Freq: Every day | ORAL | Status: DC
  Administered 2014-05-25 – 2014-06-04 (×11): 10 mg via ORAL
  Filled 2014-05-25 (×13): qty 1

## 2014-05-25 MED ORDER — ACETAMINOPHEN 325 MG PO TABS
650.0000 mg | ORAL_TABLET | Freq: Four times a day (QID) | ORAL | Status: DC | PRN
Start: 2014-05-25 — End: 2014-06-05
  Administered 2014-06-03 – 2014-06-05 (×3): 650 mg via ORAL
  Filled 2014-05-25 (×3): qty 2

## 2014-05-25 MED ORDER — QUETIAPINE FUMARATE 300 MG PO TABS
300.0000 mg | ORAL_TABLET | Freq: Two times a day (BID) | ORAL | Status: DC
  Administered 2014-05-25 – 2014-05-27 (×5): 300 mg via ORAL
  Filled 2014-05-25 (×9): qty 1

## 2014-05-25 MED ORDER — IBUPROFEN 200 MG PO TABS
600.0000 mg | ORAL_TABLET | Freq: Three times a day (TID) | ORAL | Status: DC | PRN
  Administered 2014-05-25 – 2014-05-26 (×2): 600 mg via ORAL
  Filled 2014-05-25 (×2): qty 3

## 2014-05-25 MED ORDER — ONDANSETRON HCL 4 MG PO TABS: 4.0000 mg | ORAL_TABLET | Freq: Three times a day (TID) | ORAL | Status: DC | PRN

## 2014-05-25 MED ORDER — LINAGLIPTIN 5 MG PO TABS
5.0000 mg | ORAL_TABLET | Freq: Every day | ORAL | Status: DC
  Administered 2014-05-25 – 2014-06-05 (×12): 5 mg via ORAL
  Filled 2014-05-25 (×17): qty 1

## 2014-05-25 MED ORDER — ALUM & MAG HYDROXIDE-SIMETH 200-200-20 MG/5ML PO SUSP
30.0000 mL | ORAL | Status: DC | PRN
  Administered 2014-06-04 – 2014-06-05 (×3): 30 mL via ORAL

## 2014-05-25 MED ORDER — LORAZEPAM 1 MG PO TABS
1.0000 mg | ORAL_TABLET | Freq: Four times a day (QID) | ORAL | Status: DC | PRN
  Administered 2014-05-25: 1 mg via ORAL
  Filled 2014-05-25: qty 1

## 2014-05-25 MED ORDER — METOPROLOL SUCCINATE ER 100 MG PO TB24
200.0000 mg | ORAL_TABLET | Freq: Every day | ORAL | Status: DC
  Administered 2014-05-25 – 2014-06-05 (×12): 200 mg via ORAL
  Filled 2014-05-25 (×14): qty 2

## 2014-05-25 MED ORDER — INSULIN ASPART 100 UNIT/ML ~~LOC~~ SOLN
8.0000 [IU] | Freq: Once | SUBCUTANEOUS | Status: AC
  Administered 2014-05-25: 8 [IU] via SUBCUTANEOUS

## 2014-05-25 MED ORDER — LOSARTAN POTASSIUM 50 MG PO TABS
100.0000 mg | ORAL_TABLET | Freq: Every day | ORAL | Status: DC
  Administered 2014-05-25 – 2014-06-05 (×12): 100 mg via ORAL
  Filled 2014-05-25 (×16): qty 2

## 2014-05-25 MED ORDER — MAGNESIUM HYDROXIDE 400 MG/5ML PO SUSP
30.0000 mL | Freq: Every day | ORAL | Status: DC | PRN
  Administered 2014-05-27: 30 mL via ORAL

## 2014-05-25 MED ORDER — COLCHICINE 0.6 MG PO TABS
0.6000 mg | ORAL_TABLET | Freq: Every day | ORAL | Status: DC
  Administered 2014-05-25 – 2014-06-05 (×13): 0.6 mg via ORAL
  Filled 2014-05-25 (×16): qty 1

## 2014-05-25 MED ORDER — INSULIN ASPART 100 UNIT/ML ~~LOC~~ SOLN
0.0000 [IU] | Freq: Three times a day (TID) | SUBCUTANEOUS | Status: DC
  Administered 2014-05-26 (×2): 3 [IU] via SUBCUTANEOUS
  Administered 2014-05-27: 2 [IU] via SUBCUTANEOUS
  Administered 2014-05-27: 3 [IU] via SUBCUTANEOUS
  Administered 2014-05-28: 1 [IU] via SUBCUTANEOUS
  Administered 2014-05-28 – 2014-05-29 (×3): 2 [IU] via SUBCUTANEOUS
  Administered 2014-05-30: 3 [IU] via SUBCUTANEOUS
  Administered 2014-05-30 – 2014-05-31 (×2): 5 [IU] via SUBCUTANEOUS
  Administered 2014-05-31: 3 [IU] via SUBCUTANEOUS
  Administered 2014-06-01: 2 [IU] via SUBCUTANEOUS
  Administered 2014-06-01: 3 [IU] via SUBCUTANEOUS
  Administered 2014-06-01 – 2014-06-02 (×2): 2 [IU] via SUBCUTANEOUS
  Administered 2014-06-02: 3 [IU] via SUBCUTANEOUS
  Administered 2014-06-02 – 2014-06-03 (×4): 2 [IU] via SUBCUTANEOUS
  Administered 2014-06-04 (×2): 1 [IU] via SUBCUTANEOUS
  Administered 2014-06-04: 17:00:00 via SUBCUTANEOUS
  Administered 2014-06-05: 2 [IU] via SUBCUTANEOUS
  Administered 2014-06-05: 1 [IU] via SUBCUTANEOUS

## 2014-05-25 MED ORDER — FUROSEMIDE 40 MG PO TABS
40.0000 mg | ORAL_TABLET | Freq: Every day | ORAL | Status: DC
  Administered 2014-05-25 – 2014-06-04 (×11): 40 mg via ORAL
  Filled 2014-05-25 (×13): qty 1

## 2014-05-25 MED ORDER — INSULIN ASPART 100 UNIT/ML ~~LOC~~ SOLN
5.0000 [IU] | Freq: Once | SUBCUTANEOUS | Status: AC
  Administered 2014-05-25: 5 [IU] via SUBCUTANEOUS

## 2014-05-25 MED ORDER — HALOPERIDOL 5 MG PO TABS
5.0000 mg | ORAL_TABLET | Freq: Three times a day (TID) | ORAL | Status: DC | PRN
  Administered 2014-05-25: 5 mg via ORAL
  Filled 2014-05-25: qty 1

## 2014-05-25 MED ORDER — GLIPIZIDE ER 10 MG PO TB24
10.0000 mg | ORAL_TABLET | Freq: Two times a day (BID) | ORAL | Status: DC
  Administered 2014-05-25 – 2014-06-05 (×23): 10 mg via ORAL
  Filled 2014-05-25 (×27): qty 1

## 2014-05-25 NOTE — [facility] Suicide Risk Assessment (Signed)
Suicide Risk Assessment  Admission Assessment     Nursing information obtained from:  Patient Demographic factors:  Male;Unemployed Current Mental Status:    Loss Factors:  Financial problems / change in socioeconomic status Historical Factors:    Risk Reduction Factors:  Living with another person, especially a relative;Sense of responsibility to family Total Time spent with patient: 45 minutes  CLINICAL FACTORS: Bipolar Manic    Psychiatric Specialty Exam:     Blood pressure 148/88, pulse 105, temperature 97.7 F (36.5 C), temperature source Oral, resp. rate 18, height 6' (1.829 m), weight 127.914 kg (282 lb), SpO2 99.00%.Body mass index is 38.24 kg/(m^2).  General Appearance: Fairly Groomed  Patent attorney::  Fair  Speech:  Pressured  Volume:  fluctuates,increased at times  Mood:  Anxious, Dysphoric and Irritable  Affect:  Labile and irritated, angry  Thought Process:  Circumstantial and Tangential  Orientation:  Full (Time, Place, and Person)  Thought Content:  events that happened at home with son in law, events that happened while at [facility], the way he was treated  Suicidal Thoughts:  No  Homicidal Thoughts:  No  Memory:  Immediate;   Fair Recent;   Fair Remote;   Fair  Judgement:  Impaired  Insight:  Lacking  Psychomotor Activity:  Increased and Restlessness  Concentration:  Poor  Recall:  [employer] of Knowledge:NA  Language: Fair  Akathisia:  No  Handed:    AIMS (if indicated):     Assets:  Housing Social Support  Sleep:  Number of Hours: 1.25   Musculoskeletal: Strength & Muscle Tone: within normal limits Gait & Station: normal Patient leans: N/A  COGNITIVE FEATURES THAT CONTRIBUTE TO RISK:  Closed-mindedness Loss of executive function Polarized thinking Thought constriction (tunnel vision)    SUICIDE RISK:   Moderate:   PLAN OF CARE: Supportive approach/coping skills                               Reassess and optimize treatment with  psychotropics                               Get collateral information  I certify that inpatient services furnished can reasonably be expected to improve the patient's condition.  Delaine Hernandez A 05/25/2014, 3:25 PM
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
## 2014-05-25 NOTE — Tx Team (Addendum)
Initial Interdisciplinary Treatment Plan  PATIENT STRENGTHS: (choose at least two) General fund of knowledge  PATIENT STRESSORS: Marital or family conflict   PROBLEM LIST: Problem List/Patient Goals Date to be addressed Date deferred Reason deferred Estimated date of resolution  Mania 05/25/14     Aggression 05/25/14     Anger 05/25/14                                          DISCHARGE CRITERIA:  Improved stabilization in mood, thinking, and/or behavior  PRELIMINARY DISCHARGE PLAN: Outpatient therapy  PATIENT/FAMIILY INVOLVEMENT: This treatment plan has been presented to and reviewed with the patient, Christian [name], and/or family member.  The patient and family have been given the opportunity to ask questions and make suggestions.  Gretta Arab [facility] 05/25/2014, 2:58 AM

## 2014-05-25 NOTE — Progress Notes (Signed)
Patient ID: Christian [name], male   DOB: [birth year], 63 y.o.   MRN: [id number] Psychoeducational Group Note  Date:  05/25/2014 Time:  0930  Group Topic/Focus:  healthy coping skills.   Participation Level: Did Not Attend  Participation Quality:  Not Applicable  Affect:  Not Applicable  Cognitive:  Not Applicable  Insight:  Not Applicable  Engagement in Group: Not Applicable  Additional Comments:  Did not attend.   Valente David 05/25/2014, 11:05 AM

## 2014-05-25 NOTE — Progress Notes (Signed)
Patient ID: Christian [name], male   DOB: [birth year], 63 y.o.   MRN: [id number] Staff was unable to take pts to gym at 1600 due to maintenance in the area.

## 2014-05-25 NOTE — [facility] Group Notes (Signed)
[facility] Group Notes: (Clinical Social Work)   05/25/2014      Type of Therapy:  Group Therapy   Participation Level:  Did Not Attend - Came in for last 10 minutes of group only, slept through this.   Ambrose Mantle, LCSW 05/25/2014, 1:16 PM

## 2014-05-25 NOTE — Progress Notes (Signed)
D   Pt is irritable and demanding to be discharged   He said as soon as my wife gets here I am going    He said I will get out of here tonight you will see   Pt refused to take his Depakote but did take the extra dose of insulin   Pt continues to threaten to sue for keeping him here against his will   Pt would not listen to any explanations and is not rational at this time A   Verbal support given   Medications administered and effectiveness monitored   deescelate as needed   Offer diversional activities and offer PRN medications for agitation R   Pt safe at present but remains certain his wife is coming to pick him up

## 2014-05-25 NOTE — ED Provider Notes (Signed)
Pt hostile and expresses repeatedly that he want to phsyically fight with his son-in -law who lives in his house.   Christian Sou, MD 05/25/14 [name]

## 2014-05-25 NOTE — H&P (Signed)
Psychiatric Admission Assessment Adult  Patient Identification:  Christian [name] Date of Evaluation:  05/25/2014 Chief Complaint:  BIPOLAR MANIC  Subjective: Pt seen and chart reviewed. Pt is very agitated yet unsteady on his feet. Pt informed that he needs to stay back from going to the cafeteria due to his foggy concentration, unsteady gait, and manic state with intrusiveness towards others that persists. Pt became agitated with this NP, threatening to sue all of [place] including staff at [facility]. Pt demanded to go to the cafeteria and was informed that this is not an option this time due to safety reasons. Pt became more agitated and refused to speak with this NP further, walking down the hall to his room where he stated he will wait for his food. Pt has been sleeping all day due to sedation from medication after being awake nearly continuously with 1 hour of sleep from mania. Pt was allowed to rest today and was awoken for dinner for assessment. Due to pt's agitated state and refusal to speak to this NP and other staff members, we will attempt to gather more information from pt in the AM.   History of Present Illness: Patient has a history of bipolar disorder. Presents with mania, Irritable upon approach feels that staff are "keeping me hostage" States that he made a threat against his son in law because he had threatened his safety. Patient presents as dysphoric and angry. Was up all night writing ensuring that his lawyer knew all of the wrongs that have been done to him. Patient threatens to call the media to ensure that his rights are maintained. IVC paperwork endorses that patient's behavior has become increasingly more erratic. He is quick to anger and has been threatening family. Patient has a long history of bipolar disorder and has been refusing to take his seroquel as it does not help. Patient has not slept and is hyper verbal during the interview. Denies SI but states that he wanted  to harm son in law and detailed plan of how he was going to rough him up following a family argument.    Elements:  Location:  Generalized, [facility] inpatient. Quality:  Worsening. Severity:  Severe. Timing:  Constant. Duration:  Chronic. Context:  Exacerbation of underlying bipolar disorder with manic face and events tied into family dynamic strain. Associated Signs/Synptoms: Depression Symptoms:  depressed mood, (Hypo) Manic Symptoms:  Impulsivity, Irritable Mood, Anxiety Symptoms:  Excessive Worry, Psychotic Symptoms:  Denies PTSD Symptoms: Denies Total Time spent with patient: 40 minutes  Psychiatric Specialty Exam: Physical Exam Full Physical Exam performed in ED; reviewed, stable, and I concur with this assessment.   Review of Systems  Constitutional: Negative.   HENT: Negative.   Eyes: Negative.   Respiratory: Negative.   Cardiovascular: Negative.   Gastrointestinal: Negative.   Genitourinary: Negative.   Musculoskeletal: Negative.   Skin: Negative.   Neurological: Negative.   Endo/Heme/Allergies: Negative.   Psychiatric/Behavioral: Positive for depression. The patient is nervous/anxious.     Blood pressure 148/88, pulse 105, temperature 97.7 F (36.5 C), temperature source Oral, resp. rate 18, height 6' (1.829 m), weight 127.914 kg (282 lb), SpO2 99.00%.Body mass index is 38.24 kg/(m^2).   General Appearance: Fairly Groomed   Patent attorney:: Fair   Speech: Pressured   Volume: fluctuates,increased at times   Mood: Anxious, Dysphoric and Irritable   Affect: Labile and irritated, angry   Thought Process: Circumstantial and Tangential   Orientation: Full (Time, Place, and Person)  Thought Content: events that happened at home with son in law, events that happened while at [facility], the way he was treated   Suicidal Thoughts: No   Homicidal Thoughts: No   Memory: Immediate; Fair  Recent; Fair  Remote; Fair   Judgement: Impaired   Insight: Lacking   Psychomotor  Activity: Increased and Restlessness   Concentration: Poor   Recall: [employer]   Fund of Knowledge:NA   Language: Fair   Akathisia: No   Handed:   AIMS (if indicated):   Assets: Housing  Social Support   Sleep: Number of Hours: 1.25    Musculoskeletal:  Strength & Muscle Tone: within normal limits  Gait & Station: normal  Patient leans: N/A   Past Psychiatric History: Diagnosis: Bipolar, Manic  Hospitalizations:   Outpatient Care:  Substance Abuse Care:  Self-Mutilation:  Suicidal Attempts:  Violent Behaviors:   Past Medical History:   Past Medical History  Diagnosis Date  . Gout   . Hypertension   . Depression   . Mood swings    None. Allergies:  No Known Allergies PTA Medications: Prescriptions prior to admission  Medication Sig Dispense Refill  . allopurinol (ZYLOPRIM) 300 MG tablet Take 300 mg by mouth daily.      Marland Kitchen. aspirin EC 81 MG tablet Take 81 mg by mouth daily.      Marland Kitchen. atorvastatin (LIPITOR) 10 MG tablet Take 10 mg by mouth daily.      . colchicine 0.6 MG tablet Take 0.6 mg by mouth 2 (two) times daily.       . furosemide (LASIX) 40 MG tablet Take 40-80 mg by mouth 2 (two) times daily. Take 2 tablets in the morning and 1 tablet in the evening      . glipiZIDE (GLUCOTROL XL) 10 MG 24 hr tablet Take 10 mg by mouth 2 (two) times daily.      . insulin glargine (LANTUS) 100 UNIT/ML injection Inject 55 Units into the skin at bedtime.      Marland Kitchen. latanoprost (XALATAN) 0.005 % ophthalmic solution Place 1 drop into both eyes at bedtime.      Marland Kitchen. linagliptin (TRADJENTA) 5 MG TABS tablet Take 5 mg by mouth daily.      Marland Kitchen. LORazepam (ATIVAN) 0.5 MG tablet Take 0.5 mg by mouth 2 (two) times daily.      Marland Kitchen. losartan (COZAAR) 100 MG tablet Take 100 mg by mouth daily.      . metoprolol (TOPROL-XL) 200 MG 24 hr tablet Take 200 mg by mouth daily.      . Multiple Vitamin (MULTIVITAMIN WITH MINERALS) TABS tablet Take 1 tablet by mouth at bedtime.       . potassium chloride SA  (K-DUR,KLOR-CON) 20 MEQ tablet Take 20 mEq by mouth daily.      . QUEtiapine (SEROQUEL XR) 400 MG 24 hr tablet Take 400 mg by mouth at bedtime.      . tamsulosin (FLOMAX) 0.4 MG CAPS capsule Take 0.4 mg by mouth daily.        Previous Psychotropic Medications:  Medication/Dose  SEE MAR               Substance Abuse History in the last 12 months:  No.  Consequences of Substance Abuse: NA  Social History:  reports that he has never smoked. He does not have any smokeless tobacco history on file. He reports that he does not drink alcohol or use illicit drugs. Additional Social History:  Current Place of Residence:   Place of Birth:   Family Members: Marital Status:   Children:  Sons:  Daughters: Relationships: Education:   Educational Problems/Performance: Religious Beliefs/Practices: History of Abuse (Emotional/Phsycial/Sexual) Teacher, music History:   Legal History: Hobbies/Interests:  Family History:  History reviewed. No pertinent family history.  Results for orders placed during the hospital encounter of 05/25/14 (from the past 72 hour(s))  GLUCOSE, CAPILLARY     Status: Abnormal   Collection Time    05/25/14  4:41 PM      Result Value Ref Range   Glucose-Capillary 349 (*) 70 - 99 mg/dL   Psychological Evaluations:  Assessment:   DSM5: Depressive Disorders:  Major Depressive Disorder - Severe (296.23)  AXIS I:  Bipolar, Manic AXIS II:  Deferred AXIS III:   Past Medical History  Diagnosis Date  . Gout   . Hypertension   . Depression   . Mood swings    AXIS IV:  other psychosocial or environmental problems and problems related to social environment AXIS V:  41-50 serious symptoms  Treatment Plan/Recommendations:   Review of chart, vital signs, medications, and notes.  1-Individual and group therapy  2-Medication management for depression and anxiety: Medications reviewed with the patient and she  stated no untoward effects, unchanged. 3-Coping skills for depression, anxiety  4-Continue crisis stabilization and management  5-Address health issues--monitoring vital signs, stable  6-Treatment plan in progress to prevent relapse of depression and anxiety  Treatment Plan Summary: Daily contact with patient to assess and evaluate symptoms and progress in treatment Medication management Current Medications:  Current Facility-Administered Medications  Medication Dose Route Frequency Provider Last Rate Last Dose  . acetaminophen (TYLENOL) tablet 650 mg  650 mg Oral Q6H PRN Kristeen Mans, NP      . allopurinol (ZYLOPRIM) tablet 300 mg  300 mg Oral Daily Kristeen Mans, NP   300 mg at 05/25/14 0848  . alum & mag hydroxide-simeth (MAALOX/MYLANTA) 200-200-20 MG/5ML suspension 30 mL  30 mL Oral Q4H PRN Kristeen Mans, NP      . atorvastatin (LIPITOR) tablet 10 mg  10 mg Oral q1800 Kristeen Mans, NP   10 mg at 05/25/14 1635  . colchicine tablet 0.6 mg  0.6 mg Oral Daily Kristeen Mans, NP   0.6 mg at 05/25/14 1610  . divalproex (DEPAKOTE) DR tablet 500 mg  500 mg Oral Q12H Kristeen Mans, NP   500 mg at 05/25/14 9604  . furosemide (LASIX) tablet 80 mg  80 mg Oral QAC breakfast Kristeen Mans, NP   80 mg at 05/25/14 5409   And  . furosemide (LASIX) tablet 40 mg  40 mg Oral QPC supper Kristeen Mans, NP      . glipiZIDE (GLUCOTROL XL) 24 hr tablet 10 mg  10 mg Oral BID WC Kristeen Mans, NP   10 mg at 05/25/14 1636  . haloperidol (HALDOL) tablet 5 mg  5 mg Oral Q8H PRN Kristeen Mans, NP   5 mg at 05/25/14 0851  . ibuprofen (ADVIL,MOTRIN) tablet 600 mg  600 mg Oral Q8H PRN Kristeen Mans, NP   600 mg at 05/25/14 0919  . linagliptin (TRADJENTA) tablet 5 mg  5 mg Oral Daily Kristeen Mans, NP   5 mg at 05/25/14 0824  . LORazepam (ATIVAN) tablet 1 mg  1 mg Oral Q6H PRN Kristeen Mans, NP   1 mg at 05/25/14 0851  . losartan (COZAAR)  tablet 100 mg  100 mg Oral Daily Kristeen Mans, NP   100 mg at 05/25/14 0825  .  magnesium hydroxide (MILK OF MAGNESIA) suspension 30 mL  30 mL Oral Daily PRN Kristeen Mans, NP      . metoprolol succinate (TOPROL-XL) 24 hr tablet 200 mg  200 mg Oral Daily Kristeen Mans, NP   200 mg at 05/25/14 0848  . ondansetron (ZOFRAN) tablet 4 mg  4 mg Oral Q8H PRN Kristeen Mans, NP      . QUEtiapine (SEROQUEL) tablet 300 mg  300 mg Oral BID Kristeen Mans, NP   300 mg at 05/25/14 1635    Observation Level/Precautions:  15 minute checks  Laboratory:  Labs resulted, reviewed, and stable at this time.   Psychotherapy:  Group therapy, individual therapy, psychoeducation  Medications:  See MAR above  Consultations: None    Discharge Concerns: None    Estimated LOS: 5-7 days  Other:  N/A   I certify that inpatient services furnished can reasonably be expected to improve the patient's condition.   Beau Fanny, [place] 6/27/20155:41 PM  I personally assessed the patient, reviewed the physical exam and labs and formulated the treatment plan Madie Reno A. Dub Mikes, M.D.

## 2014-05-25 NOTE — Progress Notes (Signed)
Adult Psychoeducational Group Note  Date:  05/25/2014 Time:  11:23 PM  Group Topic/Focus:  Goals Group:   The focus of this group is to help patients establish daily goals to achieve during treatment and discuss how the patient can incorporate goal setting into their daily lives to aide in recovery.  Participation Level:  Active  Participation Quality:  Appropriate  Affect:  Appropriate  Cognitive:  Appropriate  Insight: Appropriate  Engagement in Group:  Engaged  Modes of Intervention:  Discussion  Additional Comments:  Pt was upset and stayed in the hallway.  Terie Purser R 05/25/2014, 11:23 PM

## 2014-05-25 NOTE — Progress Notes (Signed)
Patient ID: BERNAL KOMM, male   DOB: 1951/10/21, 63 y.o.   MRN: [id number] Psychoeducational Group Note  Date:  05/25/2014 Time:  0910  Group Topic/Focus:  inventory group   Participation Level: Did Not Attend  Participation Quality:  Not Applicable  Affect:  Not Applicable  Cognitive:  Not Applicable  Insight:  Not Applicable  Engagement in Group: Not Applicable  Additional Comments:  Did not attend.   Valente David 05/25/2014, 11:04 AM

## 2014-05-25 NOTE — Progress Notes (Addendum)
Patient ID: Christian [name], male   DOB: [birth year], 63 y.o.   MRN: [id number] Pt admitted IVC'd by his wife for aggression.  Pt denies SI/HI/AVH.  Pt states that his son in law threatened to kill him so in return he was going to kill his son in law.  Pt currently denies wanting to hurt son-in-law however he states he is going to kick him and his daughter out of his home.  He is upset that his daughter chose her husband over him so he is no longer going to allow her to live in his home.  He reports that his son-in-law is "sorry and doesn't work."  Pt states that he is currently on disability.  Pt is tangential, however does respond appropriately to redirection.  Per report by NP, pt has been noncompliant with taking his seroquel because he feels it does not help.  However, has has become increasingly erratic.

## 2014-05-26 DIAGNOSIS — F3160 Bipolar disorder, current episode mixed, unspecified: Secondary | ICD-10-CM

## 2014-05-26 LAB — GLUCOSE, CAPILLARY
Glucose-Capillary: 102 mg/dL — ABNORMAL HIGH (ref 70–99)
Glucose-Capillary: 207 mg/dL — ABNORMAL HIGH (ref 70–99)
Glucose-Capillary: 242 mg/dL — ABNORMAL HIGH (ref 70–99)
Glucose-Capillary: 272 mg/dL — ABNORMAL HIGH (ref 70–99)

## 2014-05-26 LAB — HEMOGLOBIN A1C
Hgb A1c MFr Bld: 10 % — ABNORMAL HIGH (ref <5.7)
Mean Plasma Glucose: 240 mg/dL — ABNORMAL HIGH (ref <117)

## 2014-05-26 MED ORDER — LORAZEPAM 1 MG PO TABS
1.0000 mg | ORAL_TABLET | Freq: Four times a day (QID) | ORAL | Status: DC | PRN
  Administered 2014-05-26 – 2014-05-28 (×2): 1 mg via ORAL
  Filled 2014-05-26 (×3): qty 1

## 2014-05-26 MED ORDER — HALOPERIDOL 5 MG PO TABS
5.0000 mg | ORAL_TABLET | Freq: Four times a day (QID) | ORAL | Status: DC | PRN
  Administered 2014-05-26 – 2014-05-28 (×2): 5 mg via ORAL
  Filled 2014-05-26 (×3): qty 1

## 2014-05-26 NOTE — Progress Notes (Signed)
Patient ID: Christian [name], male   DOB: [birth year], 63 y.o.   MRN: [id number] 05-26-14 nursing shift note: D: pt has been extremely manic this am. He took all of his medications except his Depakote and his Seroquel. Pt has been had pressured speech,hyperactivity and been intrusive, as well verbally threatening with other patients. He has made grandiose stmts like stating he is going to "pay for the mht school" and " he is sending his RN to [place]"  and pay for it. He denied any si/hi/av.   A: RN made x3 attempts to administer his medications and he refused x3. The extender was made aware this am that the pt is not taking his medications. R: on his inventory sheet he wrote: slept well, appetite good, energy normal, attention good with his depression at 10 and hopelessness at 1. Physical problems have been some shoulder pain, but pt refused any medication for the pain. RN will monitor and Q 15 min ck's continue.

## 2014-05-26 NOTE — Progress Notes (Addendum)
D PT. Denies SI  And HI,  no complaints of pain or discomfort noted.  A Writer offered support and encouragement, discussed calming techniques with pt.  R Pt. Remains safe on the unit.  Pt. Became agitated at a peer this pm in group and had to leave group.  Writer talked to the pt. And praised him for leaving the group and calming self.  Pt. Was agitated on and off d/t the altercation but did eventually calm down after receiving medication.  Pt. Did complain of some shoulder pain at HS and received ibuprofen.   Pt's CBG was 272 but pt. Had already ingested carbs and sweets prior to the ck so it was  Not an accurate reading.

## 2014-05-26 NOTE — Progress Notes (Signed)
Adult Psychoeducational Group Note  Date:  05/26/2014 Time:  9:47 PM  Group Topic/Focus:  Wrap-Up Group:   The focus of this group is to help patients review their daily goal of treatment and discuss progress on daily workbooks.  Participation Level:  Active  Participation Quality:  Appropriate  Affect:  Appropriate  Cognitive:  Appropriate  Insight: Appropriate  Engagement in Group:  Engaged  Modes of Intervention:  Discussion  Additional Comments:  The patient expressed that the music therapy was great.The patient also said that he sings in church.  Octavio Manns 05/26/2014, 9:47 PM

## 2014-05-26 NOTE — BHH Counselor (Signed)
Adult Comprehensive Assessment  Patient ID: Christian [name], male   DOB: 01-Mar-1951, 63 y.o.   MRN: [id number]  Information Source: Information source: Patient  Current Stressors:  Family Relationships: Pt relationship son in law, daughter and wife is strained due to pt  Financial / Lack of resources (include bankruptcy): Pt reports limited financial resources  Living/Environment/Situation:  Living Arrangements: Spouse/significant other Living conditions (as described by patient or guardian): Pt currently lives in home with wife, daughter, and son in law which he describes as a chaotic environment as pt reports that son in law does not contribute to house hold. How long has patient lived in current situation?: 5 years What is atmosphere in current home: Chaotic  Family History:  Marital status: Married Number of Years Married: 36 What types of issues is patient dealing with in the relationship?: Pt reports that relationship with wife is strained as "she chose him (son-in-law) over me" Does patient have children?: Yes How many children?: 1 How is patient's relationship with their children?: Pt relationship with daughter is strained due to pt not agreeing with daughter marriage to son in law  Childhood History:  By whom was/is the patient raised?: Both parents Description of patient's relationship with caregiver when they were a child: "Good. My mother was wonderful. I loved my mother to death until she died.  My father and I have had a few bumps in the road because we are both very opinionated." Patient's description of current relationship with people who raised him/her: Relationship with father is currently stained due to a recent argument. Does patient have siblings?: Yes Number of Siblings: 7 Description of patient's current relationship with siblings: Close and loving Did patient suffer any verbal/emotional/physical/sexual abuse as a child?: No Did patient suffer from severe  childhood neglect?: No Has patient ever been sexually abused/assaulted/raped as an adolescent or adult?: No Was the patient ever a victim of a crime or a disaster?: No Witnessed domestic violence?: No Has patient been effected by domestic violence as an adult?: No  Education:  Highest grade of school patient has completed: BS degree in Engineer, site Currently a student?: No Learning disability?: No  Employment/Work Situation:   Employment situation: On disability Why is patient on disability: "I am on disability because of my mental state at the time" How long has patient been on disability: 3- 4 years Patient's job has been impacted by current illness: Yes Describe how patient's job has been impacted: Pt reports inability to deal with high levels of stress What is the longest time patient has a held a job?: 7-8 years 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:   Financial resources: Insurance claims handler Does patient have a Lawyer or guardian?: No  Alcohol/Substance Abuse:   What has been your use of drugs/alcohol within the last 12 months?: Pt denies If attempted suicide, did drugs/alcohol play a role in this?: No Alcohol/Substance Abuse Treatment Hx: Denies past history Has alcohol/substance abuse ever caused legal problems?: No  Social Support System:   Conservation officer, nature Support System: Fair Museum/gallery exhibitions officer System: Pt identifies siblings and several friends as positive supports Type of faith/religion: Christian How does patient's faith help to cope with current illness?: Prayer  Leisure/Recreation:   Leisure and Hobbies: Singing, listening to music, and reading  Strengths/Needs:   What things does the patient do well?: Singing, song writing, and helping others In what areas does patient struggle / problems for patient: "Nothing now, we  were struggling financially.  But when I get out of here I'll have so much money  from suing you all."  Discharge Plan:   Does patient have access to transportation?: Yes Will patient be returning to same living situation after discharge?: Yes Currently receiving community mental health services: Yes (From Whom) (Dr. Emerson MonteParrish McKinney) If no, would patient like referral for services when discharged?: Yes (What county?) Medical sales representative([place]) Does patient have financial barriers related to discharge medications?: No  Summary/Recommendations:   Summary and Recommendations (to be completed by the evaluator): Christian [name] is a 63 year old male with history of depression, hypertension, diabetes and gout who presents under IVC papers for concerns of harm to himself and others. Patient will benefit from crisis stabilization, medication evaluation, group therapy and psycho education in addition to case management for discharge planning.    Christian [name]. 05/26/2014

## 2014-05-26 NOTE — ED Provider Notes (Signed)
Medical screening examination/treatment/procedure(s) were performed by non-physician practitioner and as supervising physician I was immediately available for consultation/collaboration.   Candyce Churn III, MD 05/26/14 [id number]

## 2014-05-26 NOTE — Progress Notes (Signed)
Patient ID: Christian [name], male   DOB: [birth year], 63 y.o.   MRN: [id number] Psychoeducational Group Note  Date:  05/26/2014 Time:  0930  Group Topic/Focus:  healthy supporty systems.   Participation Level: Did Not Attend  Participation Quality:  Not Applicable  Affect:  Not Applicable  Cognitive:  Not Applicable  Insight:  Not Applicable  Engagement in Group: Not Applicable  Additional Comments:  Did not attend.   Valente David 05/26/2014, 12:11 PM

## 2014-05-26 NOTE — BHH Group Notes (Signed)
B[facility] Group Notes:  (Clinical Social Work)  05/26/2014   11:15am-12:00pm  Summary of Progress/Problems:  The main focus of today's process group was to listen to a variety of genres of music and to identify that different types of music provoke different responses.  The patient then was able to identify personally what was soothing for them, as well as energizing.  Handouts were used to record feelings evoked, as well as how patient can personally use this knowledge in sleep habits, with depression, and with other symptoms.  The patient expressed understanding of concepts, as well as knowledge of how each type of music affected him/her and how this can be used at home as a wellness/recovery tool.  Initially, he was adamantly resistant to coming into group, but then did come in when he heard the first song being played.  He danced a lot, sang along with the first few songs.  But when music played that he did not like, he was loud and intrusive and inappropriate, not allowing others to hear it either, despite many redirections from CSW.  This went on continuously throughout group.  He got up and started asking CSW and other staff members for ice water, and was directed that we could not provide that during group.  He took a Buyer, retail outside and asked the staff there for ice water, was refused due to group going on at the time.  He then came into the group room in the middle of a song, and with anger heaved the pitcher across the room.  Another patient became very irate with him for this, started cussing him out.  He kept stating that it was not his fault, it was the staff that "made" him do it by making him angry.  He proceeded to be up and down and in and out of the room physically, very labile emotionally and quite loud, difficult to redirect.  After group he was able to conceded that the staff made have done something which caused him to become angry, but they did not make him throw the pitcher, that was  his choice.  Type of Therapy:  Music Therapy   Participation Level:  Active  Participation Quality:  Attentive and Sharing, Inattentive, Monopolizing, Intrusive  Affect:  Blunted, Labile  Cognitive:  Disorganized  Insight:  Improving  Engagement in Therapy:  Improving  Modes of Intervention:   Activity, Exploration  Christian Mantle, LCSW 05/26/2014, 12:30pm

## 2014-05-26 NOTE — Progress Notes (Signed)
[facility]  05/26/2014 6:05 PM Joycie PeekCharles L Curlin  MRN:  [id number]  Subjective: Pt seen and chart reviewed. Pt denies SI, HI, and AVH, contracts for safety. Pt is present with this NP and Isaac LaudBrooks Weaver, RN who has established good rapport. Pt was calm, cooperative, and answered questions appropriately. Pt is in agreement to take psychiatric medications at this time and has been compliant during most of today's shift. Pt is willing to participate in treatment and group therapy, reporting that he has a relationship outpatient with Dr. Nolen MuMcKinney and wants to follow up with her when he is discharged.   History of Present Illness: Patient has a history of bipolar disorder. Presents with mania, Irritable upon approach feels that staff are "keeping me hostage" States that he made a threat against his son in law because he had threatened his safety. Patient presents as dysphoric and angry. Was up all night writing ensuring that his lawyer knew all of the wrongs that have been done to him. Patient threatens to call the media to ensure that his rights are maintained. IVC paperwork endorses that patient's behavior has become increasingly more erratic. He is quick to anger and has been threatening family. Patient has a long history of bipolar disorder and has been refusing to take his seroquel as it does not help. Patient has not slept and is hyper verbal during the interview. Denies SI but states that he wanted to harm son in law and detailed plan of how he was going to rough him up following a family argument.    Diagnosis:   DSM5: Depressive Disorders:  Major Depressive Disorder - Severe (296.23)  Total Time spent with patient: 25 minutes  Axis I: Bipolar, mixed Axis II: Deferred Axis III:  Past Medical History  Diagnosis Date  . Gout   . Hypertension   . Depression   . Mood swings    Axis IV: other psychosocial or environmental problems and problems related to social environment Axis V:  41-50 serious symptoms  ADL's: Intact  Sleep: Fair  Appetite: Fair  Suicidal Ideation:  Denies Homicidal Ideation:  Denies AEB (as evidenced by):  Psychiatric Specialty Exam: Physical Exam  Review of Systems  Constitutional: Negative.   HENT: Negative.   Eyes: Negative.   Respiratory: Negative.   Cardiovascular: Negative.   Gastrointestinal: Negative.   Genitourinary: Negative.   Musculoskeletal: Negative.   Skin: Negative.   Neurological: Negative.   Endo/Heme/Allergies: Negative.   Psychiatric/Behavioral: Positive for depression. The patient is nervous/anxious.     Blood pressure 150/97, pulse 101, temperature 97.8 F (36.6 C), temperature source Oral, resp. rate 20, height 6' (1.829 m), weight 127.914 kg (282 lb), SpO2 99.00%.Body mass index is 38.24 kg/(m^2).   General Appearance: Fairly Groomed   Patent attorneyye Contact:: Fair   Speech: Pressured   Volume: fluctuates,increased at times   Mood: Anxious, Dysphoric and Irritable   Affect: Labile and irritated, angry   Thought Process: Circumstantial and Tangential   Orientation: Full (Time, Place, and Person)   Thought Content: events that happened at home with son in law, [facility] the way he was treated   Suicidal Thoughts: No   Homicidal Thoughts: No   Memory: Immediate; Fair  Recent; Fair  Remote; Fair   Judgement: Impaired   Insight: Lacking   Psychomotor Activity: Increased and Restlessness   Concentration: Poor   Recall: [employer]   Fund of Knowledge:NA   Language: Fair   Akathisia: No  Handed:   AIMS (if indicated):   Assets: Housing  Social Support   Sleep: Number of Hours: 1.25    Musculoskeletal:  Strength & Muscle Tone: within normal limits  Gait & Station: normal  Patient leans: N/A   Current Medications: Current Facility-Administered Medications  Medication Dose Route Frequency Provider Last Rate Last Dose  . acetaminophen (TYLENOL) tablet 650 mg  650 mg Oral Q6H  PRN Kristeen Mans, NP      . allopurinol (ZYLOPRIM) tablet 300 mg  300 mg Oral Daily Kristeen Mans, NP   300 mg at 05/26/14 0724  . alum & mag hydroxide-simeth (MAALOX/MYLANTA) 200-200-20 MG/5ML suspension 30 mL  30 mL Oral Q4H PRN Kristeen Mans, NP      . atorvastatin (LIPITOR) tablet 10 mg  10 mg Oral q1800 Kristeen Mans, NP   10 mg at 05/26/14 1627  . colchicine tablet 0.6 mg  0.6 mg Oral Daily Kristeen Mans, NP   0.6 mg at 05/26/14 0724  . divalproex (DEPAKOTE) DR tablet 500 mg  500 mg Oral Q12H Kristeen Mans, NP   500 mg at 05/26/14 1312  . furosemide (LASIX) tablet 80 mg  80 mg Oral QAC breakfast Kristeen Mans, NP   80 mg at 05/26/14 2706   And  . furosemide (LASIX) tablet 40 mg  40 mg Oral QPC supper Kristeen Mans, NP   40 mg at 05/26/14 1722  . glipiZIDE (GLUCOTROL XL) 24 hr tablet 10 mg  10 mg Oral BID WC Kristeen Mans, NP   10 mg at 05/26/14 1627  . haloperidol (HALDOL) tablet 5 mg  5 mg Oral Q6H PRN Beau Fanny, FNP       And  . LORazepam (ATIVAN) tablet 1 mg  1 mg Oral Q6H PRN Beau Fanny, FNP      . ibuprofen (ADVIL,MOTRIN) tablet 600 mg  600 mg Oral Q8H PRN Kristeen Mans, NP   600 mg at 05/25/14 0919  . insulin aspart (novoLOG) injection 0-9 Units  0-9 Units Subcutaneous TID WC Beau Fanny, FNP   3 Units at 05/26/14 1715  . linagliptin (TRADJENTA) tablet 5 mg  5 mg Oral Daily Kristeen Mans, NP   5 mg at 05/26/14 2376  . losartan (COZAAR) tablet 100 mg  100 mg Oral Daily Kristeen Mans, NP   100 mg at 05/26/14 0724  . magnesium hydroxide (MILK OF MAGNESIA) suspension 30 mL  30 mL Oral Daily PRN Kristeen Mans, NP      . metoprolol succinate (TOPROL-XL) 24 hr tablet 200 mg  200 mg Oral Daily Kristeen Mans, NP   200 mg at 05/26/14 0724  . ondansetron (ZOFRAN) tablet 4 mg  4 mg Oral Q8H PRN Kristeen Mans, NP      . QUEtiapine (SEROQUEL) tablet 300 mg  300 mg Oral BID Kristeen Mans, NP   300 mg at 05/26/14 1312    Lab Results:  Results for orders placed during the hospital  encounter of 05/25/14 (from the past 48 hour(s))  GLUCOSE, CAPILLARY     Status: Abnormal   Collection Time    05/25/14  4:41 PM      Result Value Ref Range   Glucose-Capillary 349 (*) 70 - 99 mg/dL  GLUCOSE, CAPILLARY     Status: Abnormal   Collection Time    05/25/14  6:29 PM      Result Value Ref Range  Glucose-Capillary 289 (*) 70 - 99 mg/dL  HEMOGLOBIN Z6X     Status: Abnormal   Collection Time    05/25/14  7:30 PM      Result Value Ref Range   Hemoglobin A1C 10.0 (*) <5.7 %   Comment: (NOTE)                                                                               According to the ADA Clinical Practice Recommendations for 2011, when     HbA1c is used as a screening test:      >=6.5%   Diagnostic of Diabetes Mellitus               (if abnormal result is confirmed)     5.7-6.4%   Increased risk of developing Diabetes Mellitus     References:Diagnosis and Classification of Diabetes Mellitus,Diabetes     Care,2011,34(Suppl 1):S62-S69 and Standards of Medical Care in             Diabetes - 2011,Diabetes Care,2011,34 (Suppl 1):S11-S61.   Mean Plasma Glucose 240 (*) <117 mg/dL   Comment: Performed at [facility]  GLUCOSE, CAPILLARY     Status: Abnormal   Collection Time    05/26/14  6:39 AM      Result Value Ref Range   Glucose-Capillary 102 (*) 70 - 99 mg/dL  GLUCOSE, CAPILLARY     Status: Abnormal   Collection Time    05/26/14 11:28 AM      Result Value Ref Range   Glucose-Capillary 207 (*) 70 - 99 mg/dL  GLUCOSE, CAPILLARY     Status: Abnormal   Collection Time    05/26/14  5:04 PM      Result Value Ref Range   Glucose-Capillary 242 (*) 70 - 99 mg/dL    Physical Findings: AIMS: Facial and Oral Movements Muscles of Facial Expression: None, normal Lips and Perioral Area: None, normal Jaw: None, normal Tongue: None, normal,Extremity Movements Upper (arms, wrists, hands, fingers): None, normal Lower (legs, knees, ankles, toes): None, normal, Trunk  Movements Neck, shoulders, hips: None, normal, Overall Severity Severity of abnormal movements (highest score from questions above): None, normal Incapacitation due to abnormal movements: None, normal Patient's awareness of abnormal movements (rate only patient's report): No Awareness, Dental Status Current problems with teeth and/or dentures?: No Does patient usually wear dentures?: No  CIWA:    COWS:     Treatment Plan Summary: Daily contact with patient to assess and evaluate symptoms and progress in treatment Medication management  Plan: Review of chart, vital signs, medications, and notes.  1-Individual and group therapy  2-Medication management for depression and anxiety: Medications reviewed with the patient and she stated no untoward effects, unchanged. 3-Coping skills for depression, anxiety  4-Continue crisis stabilization and management  5-Address health issues--monitoring vital signs, stable  6-Treatment plan in progress to prevent relapse of depression and anxiety  Medical Decision Making Problem Points:  Established problem, stable/improving (1), Review of last therapy session (1) and Review of psycho-social stressors (1) Data Points:  Review or order clinical lab tests (1) Review or order medicine tests (1) Review of medication regiment & side effects (2) Review of new  medications or change in dosage (2)  I certify that inpatient services furnished can reasonably be expected to improve the patient's condition.   Beau Fanny, FNP-BC 05/26/2014, 6:05 PM I agree with assessment and plan Madie Reno A. Dub Mikes, M.D.

## 2014-05-26 NOTE — Progress Notes (Signed)
Patient ID: Christian [name], male   DOB: 1950/12/24, 63 y.o.   MRN: [id number] Psychoeducational Group Note  Date:  05/26/2014 Time:  0910  Group Topic/Focus:  inventory group   Participation Level: Did Not Attend  Participation Quality:  Not Applicable  Affect:  Not Applicable  Cognitive:  Not Applicable  Insight:  Not Applicable  Engagement in Group: Not Applicable  Additional Comments:  Did not attend.   Valente David 05/26/2014, 12:10 PM

## 2014-05-27 LAB — GLUCOSE, CAPILLARY
Glucose-Capillary: 117 mg/dL — ABNORMAL HIGH (ref 70–99)
Glucose-Capillary: 228 mg/dL — ABNORMAL HIGH (ref 70–99)
Glucose-Capillary: 154 mg/dL — ABNORMAL HIGH (ref 70–99)
Glucose-Capillary: 132 mg/dL — ABNORMAL HIGH (ref 70–99)

## 2014-05-27 MED ORDER — ZIPRASIDONE HCL 40 MG PO CAPS
40.0000 mg | ORAL_CAPSULE | Freq: Two times a day (BID) | ORAL | Status: DC
  Administered 2014-05-27 – 2014-05-28 (×2): 40 mg via ORAL
  Filled 2014-05-27 (×4): qty 1

## 2014-05-27 MED ORDER — QUETIAPINE FUMARATE 200 MG PO TABS
200.0000 mg | ORAL_TABLET | Freq: Every day | ORAL | Status: DC
  Administered 2014-05-27: 200 mg via ORAL
  Filled 2014-05-27 (×3): qty 1

## 2014-05-27 MED ORDER — LORAZEPAM 0.5 MG PO TABS
0.5000 mg | ORAL_TABLET | Freq: Two times a day (BID) | ORAL | Status: DC
  Administered 2014-05-27 – 2014-05-28 (×2): 0.5 mg via ORAL
  Filled 2014-05-27 (×2): qty 1

## 2014-05-27 MED ORDER — MENTHOL 3 MG MT LOZG
1.0000 | LOZENGE | OROMUCOSAL | Status: DC | PRN
  Administered 2014-05-27 – 2014-05-30 (×2): 3 mg via ORAL

## 2014-05-27 MED ORDER — QUETIAPINE FUMARATE 200 MG PO TABS: 200.0000 mg | ORAL_TABLET | Freq: Two times a day (BID) | ORAL | Status: DC

## 2014-05-27 MED ORDER — GUAIFENESIN ER 600 MG PO TB12
600.0000 mg | ORAL_TABLET | Freq: Two times a day (BID) | ORAL | Status: DC | PRN
  Administered 2014-05-27 – 2014-06-04 (×11): 600 mg via ORAL
  Filled 2014-05-27 (×11): qty 1

## 2014-05-27 NOTE — Tx Team (Signed)
  Interdisciplinary Treatment Plan Update   Date Reviewed:  05/27/2014  Time Reviewed:  8:31 AM  Progress in Treatment:   Attending groups: No Participating in groups: No Taking medication as prescribed: Yes  Reluctantly Tolerating medication: Yes Family/Significant other contact made: No  Limited insight Patient understands diagnosis: Yes See initial care plan Discussing patient identified problems/goals with staff: Yes Medical problems stabilized or resolved: Yes Denies suicidal/homicidal ideation: Yes In tx team Patient has not harmed self or others: Yes  For review of initial/current patient goals, please see plan of care.  Estimated Length of Stay:  4-5 days  Reason for Continuation of Hospitalization: Mania Medication stabilization  New Problems/Goals identified:  N/A  Discharge Plan or Barriers:   return home, follow up outpt  Additional Comments: Patient has a history of bipolar disorder. Presents with mania, Irritable upon approach feels that staff are "keeping me hostage" States that he made a threat against his son in law because he had threatened his safety. Patient presents as dysphoric and angry. Was up all night writing ensuring that his lawyer knew all of the wrongs that have been done to him. Patient threatens to call the media to ensure that his rights are maintained. IVC paperwork endorses that patient's behavior has become increasingly more erratic. He is quick to anger and has been threatening family. Patient has a long history of bipolar disorder and has been refusing to take his seroquel as it does not help. Patient has not slept and is hyper verbal during the interview. Denies SI but states that he wanted to harm son in law and detailed plan of how he was going to rough him up following a family argument.    Attendees:  Signature: Thedore Mins, MD 05/27/2014 8:31 AM   Signature: Richelle Ito, LCSW 05/27/2014 8:31 AM  Signature: [name], NP 05/27/2014 8:31 AM   Signature: Joslyn Devon, RN 05/27/2014 8:31 AM  Signature: Liborio Nixon, RN 05/27/2014 8:31 AM  Signature:  05/27/2014 8:31 AM  Signature:   05/27/2014 8:31 AM  Signature:    Signature:    Signature:    Signature:    Signature:    Signature:      Scribe for Treatment Team:   Richelle Ito, LCSW  05/27/2014 8:31 AM

## 2014-05-27 NOTE — Progress Notes (Signed)
The focus of this group is to help patients review their daily goal of treatment and discuss progress on daily workbooks. Pt did not attend the evening group. 

## 2014-05-27 NOTE — Progress Notes (Signed)
NSG shift assessment. 7a-7p.   D: Continues to be manic and irritable. He was not able to stay in group this morning because of his restlessness. He has been promising staff and other patients money, other gifts, and consults with his lawyer. When taking his morning medications, he questioned them all, and did not want to take some of them, but he did take all of his scheduled medications. He refused the two prn medications that this writer tried to give him to help him be more calm and relaxed, stating that he does not want anything that is going to make him feel drowsy.  He is cooperative with staff members that he likes and antagonistic towards others, not allowing them to participate in his care.   A: Observed pt interacting in group and in the milieu: Support and encouragement offered. Safety maintained with observations every 15 minutes.   R: Complained of some congestion and said that it prevents him from being able to sing. Unable to attend groups at this time. He did fill out a Self Inventory and rated his depression as 1/10, hopelessness 1/10. When he goes home he plans to follow his doctors orders. 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    6:26 PM Pt continues to talk constantly to anyone who will spend time with him, and paces the halls and his room. His wife visited and said that in the past he was always on the depressed spectrum and staying in bed. This mania is frightening to her. Pt took all of his evening medications, even though he did question them all.

## 2014-05-27 NOTE — Progress Notes (Signed)
D  Pt s mood is labile and he can be intrusive and demanding   He has improved in that he has more of an awareness of unit rules and expectations    He has made the effort to apologize to staff for the way he talked to them   He continues to be demanding but less so since Saturday night   He did take all of his medications that were due at bedtime   He does not attend groups and continues to say he is going home tomorrow A   Verbal support given  Medications administered and effectiveness monitored  Continue to redirect as needed and set enforceable limits with explaination  Q 15 min checks R   Pt safe at present and verbalizes understanding

## 2014-05-27 NOTE — Progress Notes (Signed)
D: The patient has been agitated and excited for nearly one hour. While at breakfast, the patient was unable to remain in line and would walk back and forth in an attempt to talk to the patients from the other hallways. On one occasion was heard offering another patient $2,000. He was also observed talking non-stop and therefore had difficulty eating his meal. He was also observed talking to a number of other patients and attempted to argue with a peer about where she lived.  Upon returning to the hallway, he was redirected for talking loudly and for starting an argument with his roommate. He can be both loud and accusatory. A short time ago, he was loud enough to attract the attention of the staff from the other hallways and this author had to stand between the patient and the entrance to the dayroom so as to diffuse the situation.   A: The patient was redirected on multiple occasions in both the cafeteria as well as on the unit. The nurse from both night shift and day shift were notified of these events.   R: The patient returned to his bedroom after multiple attempts to redirect him.

## 2014-05-27 NOTE — Progress Notes (Signed)
Patient ID: Christian ProctorCharles L [name], male   DOB: [birth year], 63 y.o.   [id number] [facility]  05/27/2014 11:11 AM Christian [name]  MRN:  [id number]  Subjective: Patient reports:" I am going to sue all of you for keeping here. I will sue [facility], my wife, all the nurses and you. Watch me call my doctor to get me out of here.''  Objective: Patient with a long  history of bipolar disorder per his outpatient psychiatrist report whom I spoke to this morning. Patient is extremely manic, getting agitated and irritable. He is accusing his wife and the staffs of  "keeping me hostage". He has no insight into his illness. He has become disruptive on the unit. He is very grandiose and paranoid. He states that he does not like his room mates and the staffs. He has been making threatening remarks to his family, peers and staffs.  He has been acting bizarre, belligerent and disorganized. His psychiatrist states that Geodon and Venlafaxine helped him in the past.  Diagnosis:   DSM5: Bipolar disorder  Total Time spent with patient: 25 minutes  Axis I: Bipolar 1 disorder -current episode manic  Axis II: Deferred Axis III:  Past Medical History  Diagnosis Date  . Gout   . Hypertension    Axis IV: other psychosocial or environmental problems and problems related to social environment Axis V: 41-50 serious symptoms  ADL's: Intact  Sleep: Fair  Appetite: Fair  Suicidal Ideation:  Denies Homicidal Ideation:  Denies AEB (as evidenced by):  Psychiatric Specialty Exam: Physical Exam  Psychiatric: His affect is angry and labile. His speech is rapid and/or pressured and tangential. He is agitated, aggressive and combative. Thought content is delusional. Cognition and memory are normal. He expresses impulsivity.    Review of Systems  Constitutional: Negative.   HENT: Negative.   Eyes: Negative.   Respiratory: Negative.   Cardiovascular: Negative.   Gastrointestinal: Negative.    Genitourinary: Negative.   Musculoskeletal: Negative.   Skin: Negative.   Neurological: Negative.   Endo/Heme/Allergies: Negative.   Psychiatric/Behavioral: The patient is nervous/anxious and has insomnia.     Blood pressure 144/94, pulse 106, temperature 97.4 F (36.3 C), temperature source Oral, resp. rate 20, height 6' (1.829 m), weight 127.914 kg (282 lb), SpO2 99.00%.Body mass index is 38.24 kg/(m^2).   General Appearance: Fairly Groomed   Patent attorneyye Contact:: Fair   Speech: Pressured   Volume: fluctuates,increased at times   Mood: Anxious, Dysphoric and Irritable   Affect: Labile and irritated, angry   Thought Process: Circumstantial and Tangential   Orientation: Full (Time, Place, and Person)   Thought Content: paranoid  Suicidal Thoughts: No   Homicidal Thoughts: No   Memory: Immediate; Fair  Recent; Fair  Remote; Fair   Judgement: Impaired   Insight: Lacking   Psychomotor Activity: Increased and Restlessness   Concentration: Poor   Recall: [employer]   Fund of Knowledge:NA   Language: Fair   Akathisia: No   Handed:   AIMS (if indicated):   Assets: Housing  Social Support   Sleep: Number of Hours: 1.25    Musculoskeletal:  Strength & Muscle Tone: within normal limits  Gait & Station: normal  Patient leans: N/A   Current Medications: Current Facility-Administered Medications  Medication Dose Route Frequency Provider Last Rate Last Dose  . acetaminophen (TYLENOL) tablet 650 mg  650 mg Oral Q6H PRN Kristeen MansFran E Hobson, NP      . allopurinol (ZYLOPRIM) tablet 300 mg  300 mg Oral Daily Kristeen Mans, NP   300 mg at 05/27/14 0740  . alum & mag hydroxide-simeth (MAALOX/MYLANTA) 200-200-20 MG/5ML suspension 30 mL  30 mL Oral Q4H PRN Kristeen Mans, NP      . atorvastatin (LIPITOR) tablet 10 mg  10 mg Oral q1800 Kristeen Mans, NP   10 mg at 05/26/14 1627  . colchicine tablet 0.6 mg  0.6 mg Oral Daily Kristeen Mans, NP   0.6 mg at 05/27/14 0742  . divalproex (DEPAKOTE) DR tablet 500  mg  500 mg Oral Q12H Kristeen Mans, NP   500 mg at 05/27/14 0740  . furosemide (LASIX) tablet 80 mg  80 mg Oral QAC breakfast Kristeen Mans, NP   80 mg at 05/27/14 7416   And  . furosemide (LASIX) tablet 40 mg  40 mg Oral QPC supper Kristeen Mans, NP   40 mg at 05/26/14 1722  . glipiZIDE (GLUCOTROL XL) 24 hr tablet 10 mg  10 mg Oral BID WC Kristeen Mans, NP   10 mg at 05/27/14 0742  . guaiFENesin (MUCINEX) 12 hr tablet 600 mg  600 mg Oral BID PRN [name], NP      . haloperidol (HALDOL) tablet 5 mg  5 mg Oral Q6H PRN Beau Fanny, FNP   5 mg at 05/26/14 2104   And  . LORazepam (ATIVAN) tablet 1 mg  1 mg Oral Q6H PRN Beau Fanny, FNP   1 mg at 05/26/14 2102  . ibuprofen (ADVIL,MOTRIN) tablet 600 mg  600 mg Oral Q8H PRN Kristeen Mans, NP   600 mg at 05/26/14 2213  . insulin aspart (novoLOG) injection 0-9 Units  0-9 Units Subcutaneous TID WC Beau Fanny, FNP   3 Units at 05/26/14 1715  . linagliptin (TRADJENTA) tablet 5 mg  5 mg Oral Daily Kristeen Mans, NP   5 mg at 05/27/14 0742  . LORazepam (ATIVAN) tablet 0.5 mg  0.5 mg Oral BID Lakeya Mulka      . losartan (COZAAR) tablet 100 mg  100 mg Oral Daily Kristeen Mans, NP   100 mg at 05/27/14 0741  . magnesium hydroxide (MILK OF MAGNESIA) suspension 30 mL  30 mL Oral Daily PRN Kristeen Mans, NP      . menthol-cetylpyridinium (CEPACOL) lozenge 3 mg  1 lozenge Oral PRN [name], NP      . metoprolol succinate (TOPROL-XL) 24 hr tablet 200 mg  200 mg Oral Daily Kristeen Mans, NP   200 mg at 05/27/14 0741  . ondansetron (ZOFRAN) tablet 4 mg  4 mg Oral Q8H PRN Kristeen Mans, NP      . QUEtiapine (SEROQUEL) tablet 200 mg  200 mg Oral QHS Calina Patrie      . ziprasidone (GEODON) capsule 40 mg  40 mg Oral BID WC Ralf Konopka        Lab Results:  Results for orders placed during the hospital encounter of 05/25/14 (from the past 48 hour(s))  GLUCOSE, CAPILLARY     Status: Abnormal   Collection Time    05/25/14  4:41 PM      Result  Value Ref Range   Glucose-Capillary 349 (*) 70 - 99 mg/dL  GLUCOSE, CAPILLARY     Status: Abnormal   Collection Time    05/25/14  6:29 PM      Result Value Ref Range   Glucose-Capillary 289 (*) 70 - 99 mg/dL  HEMOGLOBIN A1C     Status: Abnormal   Collection Time    05/25/14  7:30 PM      Result Value Ref Range   Hemoglobin A1C 10.0 (*) <5.7 %   Comment: (NOTE)                                                                               According to the ADA Clinical Practice Recommendations for 2011, when     HbA1c is used as a screening test:      >=6.5%   Diagnostic of Diabetes Mellitus               (if abnormal result is confirmed)     5.7-6.4%   Increased risk of developing Diabetes Mellitus     References:Diagnosis and Classification of Diabetes Mellitus,Diabetes     Care,2011,34(Suppl 1):S62-S69 and Standards of Medical Care in             Diabetes - 2011,Diabetes Care,2011,34 (Suppl 1):S11-S61.   Mean Plasma Glucose 240 (*) <117 mg/dL   Comment: Performed at [facility]  GLUCOSE, CAPILLARY     Status: Abnormal   Collection Time    05/26/14  6:39 AM      Result Value Ref Range   Glucose-Capillary 102 (*) 70 - 99 mg/dL  GLUCOSE, CAPILLARY     Status: Abnormal   Collection Time    05/26/14 11:28 AM      Result Value Ref Range   Glucose-Capillary 207 (*) 70 - 99 mg/dL  GLUCOSE, CAPILLARY     Status: Abnormal   Collection Time    05/26/14  5:04 PM      Result Value Ref Range   Glucose-Capillary 242 (*) 70 - 99 mg/dL  GLUCOSE, CAPILLARY     Status: Abnormal   Collection Time    05/26/14  8:43 PM      Result Value Ref Range   Glucose-Capillary 272 (*) 70 - 99 mg/dL  GLUCOSE, CAPILLARY     Status: Abnormal   Collection Time    05/27/14  6:14 AM      Result Value Ref Range   Glucose-Capillary 117 (*) 70 - 99 mg/dL    Physical Findings: AIMS: Facial and Oral Movements Muscles of Facial Expression: None, normal Lips and Perioral Area: None, normal Jaw:  None, normal Tongue: None, normal,Extremity Movements Upper (arms, wrists, hands, fingers): None, normal Lower (legs, knees, ankles, toes): None, normal, Trunk Movements Neck, shoulders, hips: None, normal, Overall Severity Severity of abnormal movements (highest score from questions above): None, normal Incapacitation due to abnormal movements: None, normal Patient's awareness of abnormal movements (rate only patient's report): No Awareness, Dental Status Current problems with teeth and/or dentures?: No Does patient usually wear dentures?: No  CIWA:    COWS:     Treatment Plan Summary: Daily contact with patient to assess and evaluate symptoms and progress in treatment Medication management  Plan: Review of chart, vital signs, medications, and notes.  1-Individual and group therapy  2-Medication management: -Wean patient off Seroquel-inaffective -Initiate Geodon 40mg  po bid for mood/ delusions -Continue Depakote ER 500mg  bid for mood stabilization -Initiate Ativan 0.5mg  po bid for agitastion. 3-Coping  skills for depression, anxiety  4-Continue crisis stabilization and management  5-Address health issues--monitoring vital signs, stable  6-Treatment plan in progress to prevent relapse of depression and anxiety  Medical Decision Making Problem Points:  Established problem, worsening (12), Review of last therapy session (1) and Review of psycho-social stressors (1) Data Points:  Review or order clinical lab tests (1) Review or order medicine tests (1) Review of medication regiment & side effects (2) Review of new medications or change in dosage (2)  I certify that inpatient services furnished can reasonably be expected to improve the patient's condition.   Thedore Mins, MD 05/27/2014, 11:11 AM

## 2014-05-27 NOTE — [facility] Group Notes (Signed)
[facility] LCSW Aftercare Discharge Planning Group Note   05/27/2014 11:32 AM  Participation Quality:  Did not attend    [place]

## 2014-05-28 LAB — GLUCOSE, CAPILLARY
Glucose-Capillary: 92 mg/dL (ref 70–99)
Glucose-Capillary: 151 mg/dL — ABNORMAL HIGH (ref 70–99)
Glucose-Capillary: 147 mg/dL — ABNORMAL HIGH (ref 70–99)
Glucose-Capillary: 158 mg/dL — ABNORMAL HIGH (ref 70–99)

## 2014-05-28 MED ORDER — HYDRALAZINE HCL 25 MG PO TABS
25.0000 mg | ORAL_TABLET | Freq: Three times a day (TID) | ORAL | Status: DC
  Administered 2014-05-28 – 2014-05-30 (×6): 25 mg via ORAL
  Filled 2014-05-28 (×11): qty 1

## 2014-05-28 MED ORDER — LORAZEPAM 1 MG PO TABS: 1.0000 mg | ORAL_TABLET | Freq: Two times a day (BID) | ORAL | Status: DC

## 2014-05-28 MED ORDER — QUETIAPINE FUMARATE 100 MG PO TABS
100.0000 mg | ORAL_TABLET | Freq: Every day | ORAL | Status: DC
  Filled 2014-05-28 (×2): qty 1

## 2014-05-28 MED ORDER — ZIPRASIDONE HCL 60 MG PO CAPS
60.0000 mg | ORAL_CAPSULE | Freq: Two times a day (BID) | ORAL | Status: DC
  Administered 2014-05-28 – 2014-05-29 (×2): 60 mg via ORAL
  Filled 2014-05-28 (×4): qty 1

## 2014-05-28 MED ORDER — LORAZEPAM 1 MG PO TABS
1.0000 mg | ORAL_TABLET | Freq: Three times a day (TID) | ORAL | Status: DC
  Administered 2014-05-28 – 2014-06-03 (×19): 1 mg via ORAL
  Filled 2014-05-28 (×20): qty 1

## 2014-05-28 NOTE — Progress Notes (Signed)
Patient ID: DRAGON THRUSH, male   DOB: 1951/05/17, 63 y.o.   MRN: [id number] [facility] MD Progress Note  05/28/2014 11:53 AM Christian [name]  MRN:  [id number]  Subjective: Patient reports "The patients here are talking junk to me. I feel very angry. I'm not Bipolar. My wife just sent me here because she thought I would hurt my son in law. You all are holding me hostage. Just watch I will be out of here by one pm. I am angry. I don't belong here. My Doctor will come get me out."   Objective: Patient with a long history of bipolar disorder per his outpatient psychiatrist report who was contacted yesterday by MD.  Patient is extremely manic, getting agitated and irritable. He has no insight into his mental illness. Patient was threatening his family prior to admission but insists it was he who was in danger. Nursing staff report that the patient has been getting into altercations with peers in the cafeteria. Patient has agreed to eat meals on the unit so "I don't knock those guys out." During assessment today the patient frequently Interior and spatial designer pieces of paper covered with writing. He became agitated before lunch threatening to act out if not discharged today. Patient was agreeable to taking prn doses of haldol and ativan. So far the patient has been compliant with prescribed medications.   Diagnosis:   DSM5: Bipolar disorder  Total Time spent with patient: 20 minutes Axis I: Bipolar 1 disorder -current episode manic  Axis II: Deferred Axis III:  Past Medical History  Diagnosis Date  . Gout   . Hypertension    Axis IV: other psychosocial or environmental problems and problems related to social environment Axis V: 41-50 serious symptoms  ADL's: Intact  Sleep: Fair  Appetite: Fair  Suicidal Ideation:  Denies Homicidal Ideation:  Denies AEB (as evidenced by):  Psychiatric Specialty Exam: Physical Exam  Psychiatric: His affect is angry and labile. His speech is rapid and/or pressured and  tangential. He is agitated, aggressive and combative. Thought content is delusional. Cognition and memory are normal. He expresses impulsivity.    Review of Systems  Constitutional: Negative.   HENT: Negative.   Eyes: Negative.   Respiratory: Negative.   Cardiovascular: Negative.   Gastrointestinal: Negative.   Genitourinary: Negative.   Musculoskeletal: Negative.   Skin: Negative.   Neurological: Negative.   Endo/Heme/Allergies: Negative.   Psychiatric/Behavioral: The patient is nervous/anxious and has insomnia.     Blood pressure 170/111, pulse 89, temperature 97.6 F (36.4 C), temperature source Oral, resp. rate 20, height 6' (1.829 m), weight 127.914 kg (282 lb), SpO2 99.00%.Body mass index is 38.24 kg/(m^2).   General Appearance: Fairly Groomed   Patent attorney:: Fair   Speech: Pressured   Volume: fluctuates,increased at times   Mood: Anxious, Dysphoric and Irritable   Affect: Labile and irritated, angry   Thought Process: Circumstantial and Tangential   Orientation: Full (Time, Place, and Person)   Thought Content: paranoia, delusions  Suicidal Thoughts: No   Homicidal Thoughts: No   Memory: Immediate; Fair  Recent; Fair  Remote; Fair   Judgement: Impaired   Insight: Lacking   Psychomotor Activity: Increased and Restlessness   Concentration: Poor   Recall: [employer] of Knowledge:NA   Language: Fair   Akathisia: No   Handed:   AIMS (if indicated):   Assets: Housing  Social Support   Sleep: Number of Hours: 4   Musculoskeletal:  Strength & Muscle Tone: within normal  limits  Gait & Station: normal  Patient leans: N/A  Current Medications: Current Facility-Administered Medications  Medication Dose Route Frequency Provider Last Rate Last Dose  . acetaminophen (TYLENOL) tablet 650 mg  650 mg Oral Q6H PRN Kristeen MansFran E Hobson, NP      . allopurinol (ZYLOPRIM) tablet 300 mg  300 mg Oral Daily Kristeen MansFran E Hobson, NP   300 mg at 05/28/14 [id number]  . alum & mag hydroxide-simeth  (MAALOX/MYLANTA) 200-200-20 MG/5ML suspension 30 mL  30 mL Oral Q4H PRN Kristeen MansFran E Hobson, NP      . atorvastatin (LIPITOR) tablet 10 mg  10 mg Oral q1800 Kristeen MansFran E Hobson, NP   10 mg at 05/27/14 1714  . colchicine tablet 0.6 mg  0.6 mg Oral Daily Kristeen MansFran E Hobson, NP   0.6 mg at 05/28/14 0743  . divalproex (DEPAKOTE) DR tablet 500 mg  500 mg Oral Q12H Kristeen MansFran E Hobson, NP   500 mg at 05/28/14 [id number]  . furosemide (LASIX) tablet 80 mg  80 mg Oral QAC breakfast Kristeen MansFran E Hobson, NP   80 mg at 05/28/14 0636   And  . furosemide (LASIX) tablet 40 mg  40 mg Oral QPC supper Kristeen MansFran E Hobson, NP   40 mg at 05/27/14 1716  . glipiZIDE (GLUCOTROL XL) 24 hr tablet 10 mg  10 mg Oral BID WC Kristeen MansFran E Hobson, NP   10 mg at 05/28/14 0743  . guaiFENesin (MUCINEX) 12 hr tablet 600 mg  600 mg Oral BID PRN Fransisca KaufmannLaura Davis, NP   600 mg at 05/28/14 0636  . haloperidol (HALDOL) tablet 5 mg  5 mg Oral Q6H PRN Beau FannyJohn C Withrow, FNP   5 mg at 05/26/14 2104   And  . LORazepam (ATIVAN) tablet 1 mg  1 mg Oral Q6H PRN Beau FannyJohn C Withrow, FNP   1 mg at 05/26/14 2102  . ibuprofen (ADVIL,MOTRIN) tablet 600 mg  600 mg Oral Q8H PRN Kristeen MansFran E Hobson, NP   600 mg at 05/26/14 2213  . insulin aspart (novoLOG) injection 0-9 Units  0-9 Units Subcutaneous TID WC Beau FannyJohn C Withrow, FNP   2 Units at 05/27/14 1717  . linagliptin (TRADJENTA) tablet 5 mg  5 mg Oral Daily Kristeen MansFran E Hobson, NP   5 mg at 05/28/14 0743  . LORazepam (ATIVAN) tablet 1 mg  1 mg Oral TID Ronn Smolinsky   1 mg at 05/28/14 1006  . losartan (COZAAR) tablet 100 mg  100 mg Oral Daily Kristeen MansFran E Hobson, NP   100 mg at 05/28/14 0743  . magnesium hydroxide (MILK OF MAGNESIA) suspension 30 mL  30 mL Oral Daily PRN Kristeen MansFran E Hobson, NP   30 mL at 05/27/14 2033  . menthol-cetylpyridinium (CEPACOL) lozenge 3 mg  1 lozenge Oral PRN Fransisca KaufmannLaura Davis, NP   3 mg at 05/27/14 1136  . metoprolol succinate (TOPROL-XL) 24 hr tablet 200 mg  200 mg Oral Daily Kristeen MansFran E Hobson, NP   200 mg at 05/28/14 [id number]  . ondansetron (ZOFRAN) tablet 4 mg  4  mg Oral Q8H PRN Kristeen MansFran E Hobson, NP      . QUEtiapine (SEROQUEL) tablet 100 mg  100 mg Oral QHS Jenalyn Girdner      . ziprasidone (GEODON) capsule 60 mg  60 mg Oral BID WC Tate Jerkins        Lab Results:  Results for orders placed during the hospital encounter of 05/25/14 (from the past 48 hour(s))  GLUCOSE, CAPILLARY     Status: Abnormal  Collection Time    05/26/14  5:04 PM      Result Value Ref Range   Glucose-Capillary 242 (*) 70 - 99 mg/dL  GLUCOSE, CAPILLARY     Status: Abnormal   Collection Time    05/26/14  8:43 PM      Result Value Ref Range   Glucose-Capillary 272 (*) 70 - 99 mg/dL  GLUCOSE, CAPILLARY     Status: Abnormal   Collection Time    05/27/14  6:14 AM      Result Value Ref Range   Glucose-Capillary 117 (*) 70 - 99 mg/dL  GLUCOSE, CAPILLARY     Status: Abnormal   Collection Time    05/27/14 11:55 AM      Result Value Ref Range   Glucose-Capillary 228 (*) 70 - 99 mg/dL  GLUCOSE, CAPILLARY     Status: Abnormal   Collection Time    05/27/14  4:58 PM      Result Value Ref Range   Glucose-Capillary 154 (*) 70 - 99 mg/dL  GLUCOSE, CAPILLARY     Status: Abnormal   Collection Time    05/27/14  9:13 PM      Result Value Ref Range   Glucose-Capillary 132 (*) 70 - 99 mg/dL  GLUCOSE, CAPILLARY     Status: None   Collection Time    05/28/14  6:29 AM      Result Value Ref Range   Glucose-Capillary 92  70 - 99 mg/dL   Comment 1 Notify RN      Physical Findings: AIMS: Facial and Oral Movements Muscles of Facial Expression: None, normal Lips and Perioral Area: None, normal Jaw: None, normal Tongue: None, normal,Extremity Movements Upper (arms, wrists, hands, fingers): None, normal Lower (legs, knees, ankles, toes): None, normal, Trunk Movements Neck, shoulders, hips: None, normal, Overall Severity Severity of abnormal movements (highest score from questions above): None, normal Incapacitation due to abnormal movements: None, normal Patient's awareness of  abnormal movements (rate only patient's report): No Awareness, Dental Status Current problems with teeth and/or dentures?: No Does patient usually wear dentures?: No  CIWA:    COWS:     Treatment Plan Summary: Daily contact with patient to assess and evaluate symptoms and progress in treatment Medication management  Plan: Review of chart, vital signs, medications, and notes.  1-Individual and group therapy  2-Medication management: D/C Seroquel due to ineffectiveness.  -Increase Geodon to 60 mg po bid for mood/ delusions -Continue Depakote ER 500mg  bid for mood stabilization -Increase Ativan to 1 mg po TID for agitation.  3-Coping skills for depression, anxiety  4-Continue crisis stabilization and management  5-Address health issues-Contact Internal Medicine for consult regarding elevated blood pressure. Start Hydralazine 25 mg TID for Hypertension.  Continue blood glucose monitoring and home medications for Hypertension, Diabetes, and gout, and elevated cholesterol. Last A1c of 10 shows poor prior diabetic control.  6-Treatment plan in progress to prevent relapse of depression and anxiety  Medical Decision Making Problem Points:  Established problem, worsening (2), Review of last therapy session (1) and Review of psycho-social stressors (1) Data Points:  Review or order clinical lab tests (1) Review of medication regiment & side effects (2) Review of new medications or change in dosage (2)  I certify that inpatient services furnished can reasonably be expected to improve the patient's condition.   [name], NP-C 05/28/2014, 11:53 AM  Patient seen, evaluated and I agree with notes by Nurse Practitioner. Thedore Mins, MD

## 2014-05-28 NOTE — [facility] Group Notes (Signed)
[facility] LCSW Group Therapy  05/28/2014 , 2:37 PM   Type of Therapy:  Group Therapy  Did not attend  Summary of Progress/Problems: Today's group focused on the term Diagnosis.  Participants were asked to define the term, and then pronounce whether it is a negative, positive or neutral term.  [name] 05/28/2014 , 2:37 PM

## 2014-05-28 NOTE — [facility] Group Notes (Signed)
Adult Psychoeducational Group Note  Date:  05/28/2014 Time:  9:18 PM  Group Topic/Focus:  Wrap-Up Group:   The focus of this group is to help patients review their daily goal of treatment and discuss progress on daily workbooks.  Participation Level:  Minimal  Participation Quality:  Drowsy  Affect:  Flat  Cognitive:  Appropriate  Insight: Good  Engagement in Group:  Limited  Modes of Intervention:  Discussion  Additional Comments:  Christian [name] stated his day was good after breakfast.  He said "some punks" tried him at breakfast.  So he got his food and came back to the unit.  He also said he had to eat lunch on the unit because of what happened at breakfast.  He went on to say his day was perfect after breakfast and he is "ready to go".  Christian [name] A 05/28/2014, 9:18 PM

## 2014-05-28 NOTE — Progress Notes (Signed)
Pt states that is he is here after 1pm then "you will pay", states that he will "act out" if he is not released from the hospital today, verbally de-escalated pt, Notified, NP/MD and AC, AC spoke with pt, plan is for pt to have meals on the unit, pt states that some other patients threatened him this morning at breakfast.   D:  Per pt self inventory pt reports sleeping fair, appetite good, energy level high, ability to pay attention good, rates depression at a 1 out of 10 and hopelessness at a 1 out of 10, denies SI/HI/AVH presently, no other complaints at this time, BP elevated this am, NP/MD notified, intrusive, makes threats towards certain staff and other staff he is cooperative with, makes threats to sue the hospital, pt is grandiose and paranoid.    A:  Emotional support provided, Encouraged pt to continue with treatment plan and attend all group activities, q15 min checks maintained for safety.  R:  Pt needs encouragement to go to groups, pt cooperative with staff at this time.

## 2014-05-28 NOTE — [facility] Group Notes (Signed)
Adult Psychoeducational Group Note  Date:  05/28/2014 Time:  0900am  Group Topic/Focus:  Orientation:   The focus of this group is to educate the patient on the purpose and policies of crisis stabilization and provide a format to answer questions about their admission.  The group details unit policies and expectations of patients while admitted.  Participation Level:  Did Not Attend  Christian [name] 05/28/2014, 11:19 AM

## 2014-05-29 DIAGNOSIS — F3112 Bipolar disorder, current episode manic without psychotic features, moderate: Secondary | ICD-10-CM

## 2014-05-29 LAB — GLUCOSE, CAPILLARY
Glucose-Capillary: 92 mg/dL (ref 70–99)
Glucose-Capillary: 197 mg/dL — ABNORMAL HIGH (ref 70–99)
Glucose-Capillary: 176 mg/dL — ABNORMAL HIGH (ref 70–99)
Glucose-Capillary: 122 mg/dL — ABNORMAL HIGH (ref 70–99)

## 2014-05-29 MED ORDER — ZIPRASIDONE HCL 80 MG PO CAPS
80.0000 mg | ORAL_CAPSULE | Freq: Two times a day (BID) | ORAL | Status: DC
  Administered 2014-05-29 – 2014-06-01 (×7): 80 mg via ORAL
  Filled 2014-05-29 (×10): qty 1

## 2014-05-29 NOTE — Progress Notes (Signed)
Adult Psychoeducational Group Note  Date:  05/29/2014 Time:  9:08 PM  Group Topic/Focus:  Wrap-Up Group:   The focus of this group is to help patients review their daily goal of treatment and discuss progress on daily workbooks.  Participation Level:  Did Not Attend  Participation Quality:  Did not attend  Affect:  Did not attend  Cognitive:  Did not attend  Insight: None  Engagement in Group:  Did not attend  Modes of Intervention:  Did not attend  Additional Comments:  Patient did not attend  Scot Dock 05/29/2014, 9:08 PM

## 2014-05-29 NOTE — [facility] Group Notes (Signed)
[facility] LCSW Aftercare Discharge Planning Group Note   05/29/2014 11:09 AM  Participation Quality:  Did not attend    [place]

## 2014-05-29 NOTE — Progress Notes (Signed)
Patient ID: Christian [name], male   DOB: 03-27-1951, 63 y.o.   MRN: [id number] [facility] MD Progress Note  05/29/2014 11:46 AM Christian [name]  MRN:  [id number]  Subjective: Patient reports:" If you don't discharge me by 1pm today, you going to be talking to my lawyer, don't say I don't warn.''  Objective: Patient is seen and chart reviewed. Patient continues to be  extremely manic, getting agitated easily and making threatening remarks. He is fixated on  accusing his wife and the staffs of  "keeping me hostage". He has no insight into his illness. He has become intrusive and sometimes disruptive on the unit. He is very grandiose and paranoid. Patient has been acting bizarre, belligerent and disorganized. However, he has been accepting his medications since he received assurance from his outpatient psychiatrist that he is not going to be poisoned.  Diagnosis:   DSM5: Bipolar disorder  Total Time spent with patient: 25 minutes  Axis I: Bipolar 1 disorder -current episode manic  Axis II: Deferred Axis III:  Past Medical History  Diagnosis Date  . Gout   . Hypertension    Axis IV: other psychosocial or environmental problems and problems related to social environment Axis V: 41-50 serious symptoms  ADL's: Intact  Sleep: Fair  Appetite: Fair  Suicidal Ideation:  Denies Homicidal Ideation:  Denies AEB (as evidenced by):  Psychiatric Specialty Exam: Physical Exam  Psychiatric: His affect is angry and labile. His speech is rapid and/or pressured and tangential. He is agitated, aggressive and combative. Thought content is delusional. Cognition and memory are normal. He expresses impulsivity.    Review of Systems  Constitutional: Negative.   HENT: Negative.   Eyes: Negative.   Respiratory: Negative.   Cardiovascular: Negative.   Gastrointestinal: Negative.   Genitourinary: Negative.   Musculoskeletal: Negative.   Skin: Negative.   Neurological: Negative.   Endo/Heme/Allergies:  Negative.   Psychiatric/Behavioral: The patient is nervous/anxious and has insomnia.     Blood pressure 146/82, pulse 92, temperature 97.7 F (36.5 C), temperature source Oral, resp. rate 20, height 6' (1.829 m), weight 127.914 kg (282 lb), SpO2 99.00%.Body mass index is 38.24 kg/(m^2).   General Appearance: Fairly Groomed   Patent attorney:: Fair   Speech: Pressured   Volume: fluctuates,increased at times   Mood: Anxious, Dysphoric and Irritable   Affect: Labile and irritated, angry   Thought Process: Circumstantial and Tangential   Orientation: Full (Time, Place, and Person)   Thought Content: paranoid  Suicidal Thoughts: No   Homicidal Thoughts: No   Memory: Immediate; Fair  Recent; Fair  Remote; Fair   Judgement: Impaired   Insight: Lacking   Psychomotor Activity: Increased and Restlessness   Concentration: Poor   Recall: [employer] of Knowledge:NA   Language: Fair   Akathisia: No   Handed:   AIMS (if indicated):   Assets: Housing  Social Support   Sleep: Number of Hours: 1.25    Musculoskeletal:  Strength & Muscle Tone: within normal limits  Gait & Station: normal  Patient leans: N/A   Current Medications: Current Facility-Administered Medications  Medication Dose Route Frequency Provider Last Rate Last Dose  . acetaminophen (TYLENOL) tablet 650 mg  650 mg Oral Q6H PRN Kristeen Mans, NP      . allopurinol (ZYLOPRIM) tablet 300 mg  300 mg Oral Daily Kristeen Mans, NP   300 mg at 05/29/14 0829  . alum & mag hydroxide-simeth (MAALOX/MYLANTA) 200-200-20 MG/5ML suspension 30 mL  30  mL Oral Q4H PRN Kristeen Mans, NP      . atorvastatin (LIPITOR) tablet 10 mg  10 mg Oral q1800 Kristeen Mans, NP   10 mg at 05/28/14 1737  . colchicine tablet 0.6 mg  0.6 mg Oral Daily Kristeen Mans, NP   0.6 mg at 05/29/14 1191  . divalproex (DEPAKOTE) DR tablet 500 mg  500 mg Oral Q12H Kristeen Mans, NP   500 mg at 05/29/14 4782  . furosemide (LASIX) tablet 80 mg  80 mg Oral QAC breakfast  Kristeen Mans, NP   80 mg at 05/29/14 0606   And  . furosemide (LASIX) tablet 40 mg  40 mg Oral QPC supper Kristeen Mans, NP   40 mg at 05/28/14 1737  . glipiZIDE (GLUCOTROL XL) 24 hr tablet 10 mg  10 mg Oral BID WC Kristeen Mans, NP   10 mg at 05/29/14 9562  . guaiFENesin (MUCINEX) 12 hr tablet 600 mg  600 mg Oral BID PRN [name], NP   600 mg at 05/29/14 0606  . haloperidol (HALDOL) tablet 5 mg  5 mg Oral Q6H PRN Beau Fanny, FNP   5 mg at 05/28/14 1202   And  . LORazepam (ATIVAN) tablet 1 mg  1 mg Oral Q6H PRN Beau Fanny, FNP   1 mg at 05/28/14 1202  . hydrALAZINE (APRESOLINE) tablet 25 mg  25 mg Oral 3 times per day [name], NP   25 mg at 05/29/14 0606  . ibuprofen (ADVIL,MOTRIN) tablet 600 mg  600 mg Oral Q8H PRN Kristeen Mans, NP   600 mg at 05/26/14 2213  . insulin aspart (novoLOG) injection 0-9 Units  0-9 Units Subcutaneous TID WC Beau Fanny, FNP   1 Units at 05/28/14 1738  . linagliptin (TRADJENTA) tablet 5 mg  5 mg Oral Daily Kristeen Mans, NP   5 mg at 05/29/14 1308  . LORazepam (ATIVAN) tablet 1 mg  1 mg Oral TID [name]   1 mg at 05/29/14 0828  . losartan (COZAAR) tablet 100 mg  100 mg Oral Daily Kristeen Mans, NP   100 mg at 05/29/14 6578  . magnesium hydroxide (MILK OF MAGNESIA) suspension 30 mL  30 mL Oral Daily PRN Kristeen Mans, NP   30 mL at 05/27/14 2033  . menthol-cetylpyridinium (CEPACOL) lozenge 3 mg  1 lozenge Oral PRN [name], NP   3 mg at 05/27/14 1136  . metoprolol succinate (TOPROL-XL) 24 hr tablet 200 mg  200 mg Oral Daily Kristeen Mans, NP   200 mg at 05/29/14 0829  . ondansetron (ZOFRAN) tablet 4 mg  4 mg Oral Q8H PRN Kristeen Mans, NP      . ziprasidone (GEODON) capsule 80 mg  80 mg Oral BID WC [name]        Lab Results:  Results for orders placed during the hospital encounter of 05/25/14 (from the past 48 hour(s))  GLUCOSE, CAPILLARY     Status: Abnormal   Collection Time    05/27/14 11:55 AM      Result Value Ref Range    Glucose-Capillary 228 (*) 70 - 99 mg/dL  GLUCOSE, CAPILLARY     Status: Abnormal   Collection Time    05/27/14  4:58 PM      Result Value Ref Range   Glucose-Capillary 154 (*) 70 - 99 mg/dL  GLUCOSE, CAPILLARY     Status: Abnormal  Collection Time    05/27/14  9:13 PM      Result Value Ref Range   Glucose-Capillary 132 (*) 70 - 99 mg/dL  GLUCOSE, CAPILLARY     Status: None   Collection Time    05/28/14  6:29 AM      Result Value Ref Range   Glucose-Capillary 92  70 - 99 mg/dL   Comment 1 Notify RN    GLUCOSE, CAPILLARY     Status: Abnormal   Collection Time    05/28/14 11:47 AM      Result Value Ref Range   Glucose-Capillary 151 (*) 70 - 99 mg/dL  GLUCOSE, CAPILLARY     Status: Abnormal   Collection Time    05/28/14  4:41 PM      Result Value Ref Range   Glucose-Capillary 147 (*) 70 - 99 mg/dL   Comment 1 Documented in Chart     Comment 2 Notify RN    GLUCOSE, CAPILLARY     Status: Abnormal   Collection Time    05/28/14  8:37 PM      Result Value Ref Range   Glucose-Capillary 158 (*) 70 - 99 mg/dL  GLUCOSE, CAPILLARY     Status: None   Collection Time    05/29/14  6:14 AM      Result Value Ref Range   Glucose-Capillary 92  70 - 99 mg/dL    Physical Findings: AIMS: Facial and Oral Movements Muscles of Facial Expression: None, normal Lips and Perioral Area: None, normal Jaw: None, normal Tongue: None, normal,Extremity Movements Upper (arms, wrists, hands, fingers): None, normal Lower (legs, knees, ankles, toes): None, normal, Trunk Movements Neck, shoulders, hips: None, normal, Overall Severity Severity of abnormal movements (highest score from questions above): None, normal Incapacitation due to abnormal movements: None, normal Patient's awareness of abnormal movements (rate only patient's report): No Awareness, Dental Status Current problems with teeth and/or dentures?: No Does patient usually wear dentures?: No  CIWA:    COWS:     Treatment Plan  Summary: Daily contact with patient to assess and evaluate symptoms and progress in treatment Medication management  Plan: Review of chart, vital signs, medications, and notes.  1-Individual and group therapy  2-Medication management: -Increase Geodon to 80mg  po bid for mood/ delusions -Continue Depakote ER 500mg  bid for mood stabilization -Continue  Ativan 1mg  po tid for agitastion. 3-Coping skills for depression, anxiety  4-Continue crisis stabilization and management  5-Address health issues--monitoring vital signs, stable  6-Treatment plan in progress to prevent relapse of depression and anxiety  Medical Decision Making Problem Points:  Established problem, worsening (12), Review of last therapy session (1) and Review of psycho-social stressors (1) Data Points:  Review or order clinical lab tests (1) Review or order medicine tests (1) Review of medication regiment & side effects (2) Review of new medications or change in dosage (2)  I certify that inpatient services furnished can reasonably be expected to improve the patient's condition.   Thedore MinsAkintayo, Vernal Hritz, MD 05/29/2014, 11:46 AM

## 2014-05-29 NOTE — Progress Notes (Signed)
D  Pt s mood is labile and he can be intrusive and demanding   He has improved in that he has more of an awareness of unit rules and expectations    He has made the effort to apologize to staff for the way he talked to them   He continues to be demanding but less so since Saturday night   He did take all of his medications that were due at bedtime   He does not attend groups and continues to say he is going home tomorrow  Pt continues to improve and his mood is more stable  Pt did talk about his anger problem and is aware he needs to improve in that area A   Verbal support given  Medications administered and effectiveness monitored  Continue to redirect as needed and set enforceable limits with explaination  Q 15 min checks R   Pt safe at present and verbalizes understanding

## 2014-05-29 NOTE — [facility] Group Notes (Signed)
[facility] [employer] Group Therapy  05/29/2014 , 3:50 PM    Type of Therapy:  [employer] Presentation  Participation Level:  Active  Participation Quality:  Attentive  Affect:  Blunted  Cognitive:  Oriented  Insight:  Limited  Engagement in Therapy:  Engaged  Modes of Intervention:  Discussion, Education and Socialization  Summary of Progress/Problems:  Onalee Hua from [employer] came to present his recovery story and play the guitar.  This is the first group that El has attended with me.  He was mesmerized by the guitar that the speaker brought with him, and was motivated to stay based on that.  He started out well enough, and at one point he even nodded off.  However, when the presenter began playing guitar, he launched into song and a running dialogue with anyone who might be interested.  Difficult to redirect.  [name] 05/29/2014 , 3:50 PM

## 2014-05-29 NOTE — Progress Notes (Signed)
D: Pt. Grandiose- somewhat manic.  Pt. Writes letters to staff stating he will buy them cars, etc. He frequently gives advice to other pts and staff. He reported early today that he were not discharged today by 12pm he would "act out."  A: Support/encouragement given. R: Pt. Receptive/compliant- He did not "act out" as he stated earlier. Pt denies SI/HI.

## 2014-05-30 LAB — GLUCOSE, CAPILLARY
Glucose-Capillary: 116 mg/dL — ABNORMAL HIGH (ref 70–99)
Glucose-Capillary: 241 mg/dL — ABNORMAL HIGH (ref 70–99)
Glucose-Capillary: 280 mg/dL — ABNORMAL HIGH (ref 70–99)

## 2014-05-30 MED ORDER — OLANZAPINE 10 MG PO TBDP: 10.0000 mg | ORAL_TABLET | Freq: Three times a day (TID) | ORAL | Status: DC | PRN

## 2014-05-30 MED ORDER — DIVALPROEX SODIUM 500 MG PO DR TAB
750.0000 mg | DELAYED_RELEASE_TABLET | Freq: Two times a day (BID) | ORAL | Status: DC
  Administered 2014-05-30 – 2014-06-01 (×4): 750 mg via ORAL
  Filled 2014-05-30 (×8): qty 1

## 2014-05-30 MED ORDER — HYDRALAZINE HCL 50 MG PO TABS
50.0000 mg | ORAL_TABLET | Freq: Three times a day (TID) | ORAL | Status: DC
  Administered 2014-05-30 – 2014-06-05 (×19): 50 mg via ORAL
  Filled 2014-05-30 (×4): qty 1
  Filled 2014-05-30: qty 42
  Filled 2014-05-30 (×11): qty 1
  Filled 2014-05-30: qty 42
  Filled 2014-05-30: qty 2
  Filled 2014-05-30 (×2): qty 1
  Filled 2014-05-30: qty 42
  Filled 2014-05-30 (×3): qty 1
  Filled 2014-05-30: qty 2
  Filled 2014-05-30: qty 1

## 2014-05-30 NOTE — [facility] Group Notes (Signed)
[facility] LCSW Group Therapy Note  Date/Time: 05/30/2014 1:15-1:55pm  Type of Therapy/Topic:  Group Therapy:  Balance in Life  Participation Level: Patient did not attend group.  Otilio Saber M 05/30/2014, 2:17 PM

## 2014-05-30 NOTE — Tx Team (Signed)
  Interdisciplinary Treatment Plan Update   Date Reviewed:  05/30/2014  Time Reviewed:  5:44 PM  Progress in Treatment:   Attending groups: Yes Participating in groups: Yes Taking medication as prescribed: Yes  Tolerating medication: Yes Family/Significant other contact made: Yes  Patient understands diagnosis: Yes  Discussing patient identified problems/goals with staff: Yes Medical problems stabilized or resolved: Yes Denies suicidal/homicidal ideation: Yes Patient has not harmed self or others: Yes  For review of initial/current patient goals, please see plan of care.  Estimated Length of Stay:  4-5 days  Reason for Continuation of Hospitalization: Mania Medication stabilization  New Problems/Goals identified:  N/A  Discharge Plan or Barriers:   return home, follow up outpt  Additional Comments:  Patient reports "I have things to do. People are depending on me. I am doing everything right. I'm happy. I'm not crazy. My wife did the right thing by getting me the help."   Patient continues to  get agitated easily and make threatening remarks. He continues to focus on being out by "twelve" each day becoming threatening when told that today is not the discharge date designated by the treatment team. Patient is making some slow progress. He was noted to become less agitated today when told he would not be leaving. He was able to joke with members of the treatment team during his assessment. He remains compliant with medications. Patient continues to hand staff papers with bizarre writings such as rating different services of the hospital. He is still not sleeping well at night.    Attendees:  Signature: Thedore Mins, MD 05/30/2014 5:44 PM   Signature: Richelle Ito, LCSW 05/30/2014 5:44 PM  Signature: [name], NP 05/30/2014 5:44 PM  Signature: Joslyn Devon, RN 05/30/2014 5:44 PM  Signature: Liborio Nixon, RN 05/30/2014 5:44 PM  Signature:  05/30/2014 5:44 PM  Signature:   05/30/2014 5:44  PM  Signature:    Signature:    Signature:    Signature:    Signature:    Signature:      Scribe for Treatment Team:   Richelle Ito, LCSW  05/30/2014 5:44 PM

## 2014-05-30 NOTE — Progress Notes (Signed)
Patient in dayroom watching TV at beginning of shift. Appeared sleepy. Went back to his room instead of going to group. Denies SI or HI at this time. Denies voices at this time. Cooperative with staff. No complaints. Resting in bed with eyes closed at this time. Billy Coast, RN

## 2014-05-30 NOTE — Progress Notes (Signed)
Patient ID: Christian [name], male   DOB: 1951-01-11, 63 y.o.   MRN: [id number] [facility] MD Progress Note  05/30/2014 12:59 PM Christian [name]  MRN:  [id number]  Subjective: Patient reports "I have things to do. People are depending on me. I am doing everything right. I'm happy. I'm not crazy. My wife did the right thing by getting me the help."   Objective: Patient is seen and chart reviewed. Patient continues to be manic, getting agitated easily and making threatening remarks. He continues to focus on being out by "twelve" each day becoming threatening when told that day is not the discharge date designated by the treatment team. Patient is making some slow progress. He was noted to become less agitated today when told he would not be leaving. He was able to joke with members of the treatment team during his assessment. Patient was encouraged to let his wife come visit him to provide feedback on his status. He remains compliant with medications. Patient continues to hand staff papers with bizarre writings such as rating different services of the hospital. He is still not sleeping well at night.   Diagnosis:   DSM5: Bipolar disorder  Total Time spent with patient: 20 minutes  Axis I: Bipolar 1 disorder -current episode manic  Axis II: Deferred Axis III:  Past Medical History  Diagnosis Date  . Gout   . Hypertension    Axis IV: other psychosocial or environmental problems and problems related to social environment Axis V: 41-50 serious symptoms  ADL's: Intact  Sleep: Fair  Appetite: Fair  Suicidal Ideation:  Denies Homicidal Ideation:  Denies AEB (as evidenced by):  Psychiatric Specialty Exam: Physical Exam  Psychiatric: His affect is labile. His speech is rapid and/or pressured and tangential. He is agitated. Thought content is delusional. Cognition and memory are normal. He expresses impulsivity.    Review of Systems  Constitutional: Negative.   HENT: Negative.   Eyes:  Negative.   Respiratory: Negative.   Cardiovascular: Negative.   Gastrointestinal: Negative.   Genitourinary: Negative.   Musculoskeletal: Negative.   Skin: Negative.   Neurological: Negative.   Endo/Heme/Allergies: Negative.   Psychiatric/Behavioral: The patient is nervous/anxious and has insomnia.     Blood pressure 172/103, pulse 94, temperature 97.9 F (36.6 C), temperature source Oral, resp. rate 18, height 6' (1.829 m), weight 127.914 kg (282 lb), SpO2 99.00%.Body mass index is 38.24 kg/(m^2).   General Appearance: Fairly Groomed   Patent attorney:: Fair   Speech: Pressured   Volume: fluctuates,increased at times   Mood: Anxious and Irritable   Affect: Labile   Thought Process: Circumstantial and Tangential   Orientation: Full (Time, Place, and Person)   Thought Content: Rumination  Suicidal Thoughts: No   Homicidal Thoughts: No   Memory: Immediate; Fair  Recent; Fair  Remote; Fair   Judgement: Impaired   Insight: Shallow  Psychomotor Activity: Increased and Restlessness   Concentration: Poor   Recall: [employer] of Knowledge:NA   Language: Fair   Akathisia: No   Handed: Right  AIMS (if indicated):   Assets: Housing  Social Support   Sleep: Number of Hours: 2   Musculoskeletal:  Strength & Muscle Tone: within normal limits  Gait & Station: normal  Patient leans: N/A  Current Medications: Current Facility-Administered Medications  Medication Dose Route Frequency Provider Last Rate Last Dose  . acetaminophen (TYLENOL) tablet 650 mg  650 mg Oral Q6H PRN Kristeen Mans, NP      .  allopurinol (ZYLOPRIM) tablet 300 mg  300 mg Oral Daily Kristeen MansFran E Hobson, NP   300 mg at 05/30/14 0817  . alum & mag hydroxide-simeth (MAALOX/MYLANTA) 200-200-20 MG/5ML suspension 30 mL  30 mL Oral Q4H PRN Kristeen MansFran E Hobson, NP      . atorvastatin (LIPITOR) tablet 10 mg  10 mg Oral q1800 Kristeen MansFran E Hobson, NP   10 mg at 05/29/14 1709  . colchicine tablet 0.6 mg  0.6 mg Oral Daily Kristeen MansFran E Hobson, NP    0.6 mg at 05/30/14 0816  . divalproex (DEPAKOTE) DR tablet 750 mg  750 mg Oral Q12H Octavius Shin      . furosemide (LASIX) tablet 80 mg  80 mg Oral QAC breakfast Kristeen MansFran E Hobson, NP   80 mg at 05/30/14 [id number]   And  . furosemide (LASIX) tablet 40 mg  40 mg Oral QPC supper Kristeen MansFran E Hobson, NP   40 mg at 05/29/14 1709  . glipiZIDE (GLUCOTROL XL) 24 hr tablet 10 mg  10 mg Oral BID WC Kristeen MansFran E Hobson, NP   10 mg at 05/30/14 0817  . guaiFENesin (MUCINEX) 12 hr tablet 600 mg  600 mg Oral BID PRN Fransisca KaufmannLaura Davis, NP   600 mg at 05/30/14 0535  . hydrALAZINE (APRESOLINE) tablet 50 mg  50 mg Oral 3 times per day Amity Roes      . ibuprofen (ADVIL,MOTRIN) tablet 600 mg  600 mg Oral Q8H PRN Kristeen MansFran E Hobson, NP   600 mg at 05/26/14 2213  . insulin aspart (novoLOG) injection 0-9 Units  0-9 Units Subcutaneous TID WC Beau FannyJohn C Withrow, FNP   3 Units at 05/30/14 1208  . linagliptin (TRADJENTA) tablet 5 mg  5 mg Oral Daily Kristeen MansFran E Hobson, NP   5 mg at 05/30/14 0817  . LORazepam (ATIVAN) tablet 1 mg  1 mg Oral TID Harshini Trent   1 mg at 05/30/14 0817  . losartan (COZAAR) tablet 100 mg  100 mg Oral Daily Kristeen MansFran E Hobson, NP   100 mg at 05/30/14 0816  . magnesium hydroxide (MILK OF MAGNESIA) suspension 30 mL  30 mL Oral Daily PRN Kristeen MansFran E Hobson, NP   30 mL at 05/27/14 2033  . menthol-cetylpyridinium (CEPACOL) lozenge 3 mg  1 lozenge Oral PRN Fransisca KaufmannLaura Davis, NP   3 mg at 05/27/14 1136  . metoprolol succinate (TOPROL-XL) 24 hr tablet 200 mg  200 mg Oral Daily Kristeen MansFran E Hobson, NP   200 mg at 05/30/14 0816  . OLANZapine zydis (ZYPREXA) disintegrating tablet 10 mg  10 mg Oral Q8H PRN Jayleigh Notarianni      . ondansetron (ZOFRAN) tablet 4 mg  4 mg Oral Q8H PRN Kristeen MansFran E Hobson, NP      . ziprasidone (GEODON) capsule 80 mg  80 mg Oral BID WC Atreyu Mak   80 mg at 05/30/14 [id number]    Lab Results:  Results for orders placed during the hospital encounter of 05/25/14 (from the past 48 hour(s))  GLUCOSE, CAPILLARY     Status: Abnormal    Collection Time    05/28/14  4:41 PM      Result Value Ref Range   Glucose-Capillary 147 (*) 70 - 99 mg/dL   Comment 1 Documented in Chart     Comment 2 Notify RN    GLUCOSE, CAPILLARY     Status: Abnormal   Collection Time    05/28/14  8:37 PM      Result Value Ref Range   Glucose-Capillary  158 (*) 70 - 99 mg/dL  GLUCOSE, CAPILLARY     Status: None   Collection Time    05/29/14  6:14 AM      Result Value Ref Range   Glucose-Capillary 92  70 - 99 mg/dL  GLUCOSE, CAPILLARY     Status: Abnormal   Collection Time    05/29/14 11:37 AM      Result Value Ref Range   Glucose-Capillary 197 (*) 70 - 99 mg/dL  GLUCOSE, CAPILLARY     Status: Abnormal   Collection Time    05/29/14  4:45 PM      Result Value Ref Range   Glucose-Capillary 176 (*) 70 - 99 mg/dL  GLUCOSE, CAPILLARY     Status: Abnormal   Collection Time    05/29/14  8:32 PM      Result Value Ref Range   Glucose-Capillary 122 (*) 70 - 99 mg/dL   Comment 1 Notify RN    GLUCOSE, CAPILLARY     Status: Abnormal   Collection Time    05/30/14  6:08 AM      Result Value Ref Range   Glucose-Capillary 116 (*) 70 - 99 mg/dL  GLUCOSE, CAPILLARY     Status: Abnormal   Collection Time    05/30/14 12:02 PM      Result Value Ref Range   Glucose-Capillary 241 (*) 70 - 99 mg/dL    Physical Findings: AIMS: Facial and Oral Movements Muscles of Facial Expression: None, normal Lips and Perioral Area: None, normal Jaw: None, normal Tongue: None, normal,Extremity Movements Upper (arms, wrists, hands, fingers): None, normal Lower (legs, knees, ankles, toes): None, normal, Trunk Movements Neck, shoulders, hips: None, normal, Overall Severity Severity of abnormal movements (highest score from questions above): None, normal Incapacitation due to abnormal movements: None, normal Patient's awareness of abnormal movements (rate only patient's report): No Awareness, Dental Status Current problems with teeth and/or dentures?: No Does patient  usually wear dentures?: No  CIWA:  CIWA-Ar Total: 0 COWS:     Treatment Plan Summary: Daily contact with patient to assess and evaluate symptoms and progress in treatment Medication management  Plan: Review of chart, vital signs, medications, and notes.  1-Individual and group therapy  2-Medication management: -Continue Geodon to 80mg  po bid for mood/ delusions -Increase  Depakote ER to 750 mg bid for mood stabilization -Continue  Ativan 1mg  po tid for agitastion. 3-Coping skills for mood lability, agitation  4-Continue crisis stabilization and management  5-Address health issues-Increase Apresoline to 50 mg three time per day for elevated blood pressure.  6-Treatment plan in progress to prevent relapse of Bipolar symptoms  Medical Decision Making Problem Points:  Established problem, slight improvement (1), Review of last therapy session (1) and Review of psycho-social stressors (1) Data Points:  Review or order clinical lab tests (1) Review of medication regiment & side effects (2) Review of new medications or change in dosage (2)  I certify that inpatient services furnished can reasonably be expected to improve the patient's condition.   [name], NP-C 05/30/2014, 12:59 PM  Patient seen, evaluated and I agree with notes by Nurse Practitioner. Thedore Mins, MD

## 2014-05-30 NOTE — Progress Notes (Addendum)
LCSW spoke to patient's wife, Gloriajean Dell [phone].  Wife verbalized concerns of patient discharging home due to aggression and irritability.  Wife would like a phone call to notify her of tentative discharge date when available so that she can prepare.  LCSW explained that patient would likely be at [facility] through the weekend.  Wife also states that patient's daughter and son-in-law have moved out of the home since patient has been hospitalized.  Patient came to LCSW agitated as he does not want to stay at [facility] through the weekend.  Tessa Lerner, MSW, LCSW 3:07 PM 05/30/2014

## 2014-05-30 NOTE — Progress Notes (Signed)
Patient ID: Christian [name], male   DOB: Jun 27, 1951, 63 y.o.   MRN: [id number]  [facility] Group 9:30 A.M.  The focus of this group is to educate the patient on the purpose and policies of crisis stabilization and provide a format to answer questions about their admission.  The group details unit policies and expectations of patients while admitted.  Patient did not attend group.

## 2014-05-30 NOTE — Progress Notes (Signed)
Patient ID: Christian [name], male   DOB: 22-Mar-1951, 63 y.o.   MRN: [id number]  D: Pt. Denies SI/HI and A/V Hallucinations. Patient does not report any pain or discomfort at this time. Patient continues to be intrusive but apologizes after being intrusive. Patient rates his depression and hopelessness at 1/10 for the day. Patient continues to state that there is going to be "repercussions" if he does not leave today by 12 pm although patient is not ready for discharge. MD is aware, patient has made these statements several days in a row however has remained calm.  A: Support and encouragement provided to the patient to remain calm and speak to the staff as needed. Medication administered to patient per physician's orders.  R: Patient is cooperative with this Clinical research associate. Patient is seen in the milieu but does not appear to be attending groups. Q15 minute checks are maintained for safety.

## 2014-05-30 NOTE — Progress Notes (Signed)
Patient ID: Christian [name], male   DOB: November 27, 1951, 63 y.o.   MRN: [id number] D: Patient denies suicidal /homicidal ideation intent and plan. Pt repeatedly makes threats about what will happen if he is not discharge. Pt is calm on unit intrusive at time but cooperative. Pt attended evening karaoke group. Cooperative with assessment. No acute distressed noted at this time.   A: Met with pt 1:1. Medications administered as prescribed. Writer encouraged pt to discuss feelings. Pt encouraged to come to staff with any questions or concerns.   R: Patient is safe on the unit. He is complaint with medications and denies any adverse reaction. Continue current POC.

## 2014-05-31 LAB — GLUCOSE, CAPILLARY
Glucose-Capillary: 247 mg/dL — ABNORMAL HIGH (ref 70–99)
Glucose-Capillary: 160 mg/dL — ABNORMAL HIGH (ref 70–99)
Glucose-Capillary: 273 mg/dL — ABNORMAL HIGH (ref 70–99)
Glucose-Capillary: 127 mg/dL — ABNORMAL HIGH (ref 70–99)
Glucose-Capillary: 204 mg/dL — ABNORMAL HIGH (ref 70–99)
Glucose-Capillary: 211 mg/dL — ABNORMAL HIGH (ref 70–99)

## 2014-05-31 MED ORDER — TRAZODONE HCL 100 MG PO TABS
100.0000 mg | ORAL_TABLET | Freq: Every day | ORAL | Status: DC
  Administered 2014-05-31: 100 mg via ORAL
  Filled 2014-05-31 (×3): qty 1

## 2014-05-31 NOTE — [facility] Group Notes (Signed)
[facility] LCSW Aftercare Discharge Planning Group Note   05/31/2014 11:07 AM  Participation Quality:  Met me at day room door as I was coming in.  "I like you, but I don't like group."      Kasey Hansell, Barbaraann Rondo B

## 2014-05-31 NOTE — Progress Notes (Signed)
Psychoeducational Group Note  Date:  05/31/2014 Time:  2136  Group Topic/Focus:  Wrap-Up Group:   The focus of this group is to help patients review their daily goal of treatment and discuss progress on daily workbooks.  Participation Level: Did Not Attend  Participation Quality:  Not Applicable  Affect:  Not Applicable  Cognitive:  Not Applicable  Insight:  Not Applicable  Engagement in Group: Not Applicable  Additional Comments:  The patient did not attend group this evening since he was asleep in his bed.   Hazle Coca S 05/31/2014, 9:37 PM

## 2014-05-31 NOTE — [facility] Group Notes (Signed)
[facility] LCSW Group Therapy  05/31/2014  1:05 PM  Type of Therapy:  Group therapy  Participation Level:  Active  Participation Quality:  Attentive  Affect:  Flat  Cognitive:  Oriented  Insight:  Limited  Engagement in Therapy:  Limited  Modes of Intervention:  Discussion, Socialization  Summary of Progress/Problems:  Chaplain was here to lead a group on themes of hope and courage.  Athanasius continues with a degree of disorganization.  It was impossible for him to define hope for himself.  However, he jumped into the conversation on a regular basis, whether it was relevant or not.  He is making progress in that he was able to be redirected, and was raising his hand to ask for permission to speak.  He became tearful when talking about his mother and how she raised 8 children, and left.  Came back and gave a note to [facility] to read that talked about his love and admiration for his mother.  We then continued to redirect him.  [name] 05/31/2014 1:34 PM

## 2014-05-31 NOTE — Progress Notes (Signed)
Patient ID: DOEL HEITING, male   DOB: Jul 31, 1951, 63 y.o.   MRN: [id number] D.  Patient presents with irritable mood, affect labile. Aaro continues to present with disorganized thought process, and he remains grandiose. Franz states '' I have all that money I am going to set up an account for [employer] since he should be the employee of the year. '' Cion continues to focus on discharge stating '' I need a Arts administrator since I am in here with no bail I want to talk to somebody because I'm not staying in here all day. Pt completed self inventory and he denies feeling depressed, rating at 1/10 on depression scale. He denies any SI/HI he writers he plans to follow all meds at discharge. A. Medications given as ordered. Noted elevated B/P and discussed above information with L. Davis NP. R. Patient has been visible on the unit, he remains intrusive and disorganized at times , but is able to follow redirection. No further voiced concerns at this time. Will continue to monitor.

## 2014-05-31 NOTE — Progress Notes (Signed)
Patient ID: Christian [name], male   DOB: 20-Jan-1951, 63 y.o.   MRN: [id number]  [facility] MD Progress Note  05/31/2014 10:50 AM Christian [name]  MRN:  [id number]  Subjective: Patient reports "I don't want to get angry with the MD about leaving. I will let you read this note and relay the message. I don't know where my son in law is. I hope I don't have to see him. My wife was just so worried that I would hurt him. But I have better things to do than sit around in here."   Objective: Patient is seen and chart reviewed. He continues to have an irritable mood and labile affect. Patient expresses many grandiose delusions such as being very rich. Breon presents a handwritten note to justify why he should be discharged today. Patient appears to be argumentative during assessment since his medications have been increased over the last few days. The patient appears to be slowly responding to his medications. He appears to be less agitated than on days past. The start of his note stated "I Twain Pollan of sound mind on 05/31/14 want to be released by 12 pm and no later. I should have a court ordered public defendant." His note goes on at length about his personal MD and the conflict with the son-in-law. Patient becomes more pressured when speaking about the events that resulted in his admission. Patient remains complaint with his medication.   Diagnosis:   DSM5: Bipolar disorder  Total Time spent with patient: 20 minutes  Axis I: Bipolar 1 disorder -current episode manic  Axis II: Deferred Axis III:  Past Medical History  Diagnosis Date  . Gout   . Hypertension    Axis IV: other psychosocial or environmental problems and problems related to social environment Axis V: 41-50 serious symptoms  ADL's: Intact  Sleep: Fair  Appetite: Fair  Suicidal Ideation:  Denies Homicidal Ideation:  Denies AEB (as evidenced by):  Psychiatric Specialty Exam: Physical Exam  Psychiatric: His affect is labile.  His speech is rapid and/or pressured and tangential. He is agitated. Thought content is delusional. Cognition and memory are normal. He expresses impulsivity.    Review of Systems  Constitutional: Negative.   HENT: Negative.   Eyes: Negative.   Respiratory: Negative.   Cardiovascular: Negative.   Gastrointestinal: Negative.   Genitourinary: Negative.   Musculoskeletal: Negative.   Skin: Negative.   Neurological: Negative.   Endo/Heme/Allergies: Negative.   Psychiatric/Behavioral: The patient is nervous/anxious and has insomnia.     Blood pressure 160/111, pulse 84, temperature 98.1 F (36.7 C), temperature source Oral, resp. rate 18, height 6' (1.829 m), weight 127.914 kg (282 lb), SpO2 99.00%.Body mass index is 38.24 kg/(m^2).   General Appearance: Fairly Groomed   Patent attorney:: Fair   Speech: Pressured   Volume: fluctuates,increased at times   Mood: Anxious and Irritable   Affect: Labile   Thought Process: Circumstantial and Tangential   Orientation: Full (Time, Place, and Person)   Thought Content: Rumination  Suicidal Thoughts: No   Homicidal Thoughts: No   Memory: Immediate; Fair  Recent; Fair  Remote; Fair   Judgement: Impaired   Insight: Shallow  Psychomotor Activity: Increased and Restlessness   Concentration: Poor   Recall: Christian [name] of Knowledge:NA   Language: Fair   Akathisia: No   Handed: Right  AIMS (if indicated):   Assets: Housing  Social Support   Sleep: Number of Hours: 2   Musculoskeletal:  Strength & Muscle  Tone: within normal limits  Gait & Station: normal  Patient leans: N/A  Current Medications: Current Facility-Administered Medications  Medication Dose Route Frequency Provider Last Rate Last Dose  . acetaminophen (TYLENOL) tablet 650 mg  650 mg Oral Q6H PRN Kristeen MansFran E Hobson, NP      . allopurinol (ZYLOPRIM) tablet 300 mg  300 mg Oral Daily Kristeen MansFran E Hobson, NP   300 mg at 05/31/14 0800  . alum & mag hydroxide-simeth (MAALOX/MYLANTA)  200-200-20 MG/5ML suspension 30 mL  30 mL Oral Q4H PRN Kristeen MansFran E Hobson, NP      . atorvastatin (LIPITOR) tablet 10 mg  10 mg Oral q1800 Kristeen MansFran E Hobson, NP   10 mg at 05/30/14 1713  . colchicine tablet 0.6 mg  0.6 mg Oral Daily Kristeen MansFran E Hobson, NP   0.6 mg at 05/31/14 0800  . divalproex (DEPAKOTE) DR tablet 750 mg  750 mg Oral Q12H Fayette Gasner   750 mg at 05/31/14 0801  . furosemide (LASIX) tablet 80 mg  80 mg Oral QAC breakfast Kristeen MansFran E Hobson, NP   80 mg at 05/31/14 0801   And  . furosemide (LASIX) tablet 40 mg  40 mg Oral QPC supper Kristeen MansFran E Hobson, NP   40 mg at 05/30/14 1713  . glipiZIDE (GLUCOTROL XL) 24 hr tablet 10 mg  10 mg Oral BID WC Kristeen MansFran E Hobson, NP   10 mg at 05/31/14 0800  . guaiFENesin (MUCINEX) 12 hr tablet 600 mg  600 mg Oral BID PRN Fransisca KaufmannLaura Davis, NP   600 mg at 05/30/14 1933  . hydrALAZINE (APRESOLINE) tablet 50 mg  50 mg Oral 3 times per day July Nickson   50 mg at 05/31/14 0803  . ibuprofen (ADVIL,MOTRIN) tablet 600 mg  600 mg Oral Q8H PRN Kristeen MansFran E Hobson, NP   600 mg at 05/26/14 2213  . insulin aspart (novoLOG) injection 0-9 Units  0-9 Units Subcutaneous TID WC Beau FannyJohn C Withrow, FNP   3 Units at 05/31/14 [phone]  . linagliptin (TRADJENTA) tablet 5 mg  5 mg Oral Daily Kristeen MansFran E Hobson, NP   5 mg at 05/31/14 0800  . LORazepam (ATIVAN) tablet 1 mg  1 mg Oral TID Camden Knotek   1 mg at 05/31/14 0803  . losartan (COZAAR) tablet 100 mg  100 mg Oral Daily Kristeen MansFran E Hobson, NP   100 mg at 05/31/14 0800  . magnesium hydroxide (MILK OF MAGNESIA) suspension 30 mL  30 mL Oral Daily PRN Kristeen MansFran E Hobson, NP   30 mL at 05/27/14 2033  . menthol-cetylpyridinium (CEPACOL) lozenge 3 mg  1 lozenge Oral PRN Fransisca KaufmannLaura Davis, NP   3 mg at 05/30/14 1500  . metoprolol succinate (TOPROL-XL) 24 hr tablet 200 mg  200 mg Oral Daily Kristeen MansFran E Hobson, NP   200 mg at 05/31/14 0801  . OLANZapine zydis (ZYPREXA) disintegrating tablet 10 mg  10 mg Oral Q8H PRN Tearsa Kowalewski      . ondansetron (ZOFRAN) tablet 4 mg  4 mg Oral Q8H  PRN Kristeen MansFran E Hobson, NP      . ziprasidone (GEODON) capsule 80 mg  80 mg Oral BID WC Courtland Reas   80 mg at 05/31/14 0801    Lab Results:  Results for orders placed during the hospital encounter of 05/25/14 (from the past 48 hour(s))  GLUCOSE, CAPILLARY     Status: Abnormal   Collection Time    05/29/14 11:37 AM      Result Value Ref Range  Glucose-Capillary 197 (*) 70 - 99 mg/dL  GLUCOSE, CAPILLARY     Status: Abnormal   Collection Time    05/29/14  4:45 PM      Result Value Ref Range   Glucose-Capillary 176 (*) 70 - 99 mg/dL  GLUCOSE, CAPILLARY     Status: Abnormal   Collection Time    05/29/14  8:32 PM      Result Value Ref Range   Glucose-Capillary 122 (*) 70 - 99 mg/dL   Comment 1 Notify RN    GLUCOSE, CAPILLARY     Status: Abnormal   Collection Time    05/30/14  6:08 AM      Result Value Ref Range   Glucose-Capillary 116 (*) 70 - 99 mg/dL  GLUCOSE, CAPILLARY     Status: Abnormal   Collection Time    05/30/14 12:02 PM      Result Value Ref Range   Glucose-Capillary 241 (*) 70 - 99 mg/dL  GLUCOSE, CAPILLARY     Status: Abnormal   Collection Time    05/30/14  4:25 PM      Result Value Ref Range   Glucose-Capillary 280 (*) 70 - 99 mg/dL  GLUCOSE, CAPILLARY     Status: Abnormal   Collection Time    05/30/14  9:35 PM      Result Value Ref Range   Glucose-Capillary 247 (*) 70 - 99 mg/dL   Comment 1 Notify RN     Comment 2 Documented in Chart    GLUCOSE, CAPILLARY     Status: Abnormal   Collection Time    05/31/14  5:48 AM      Result Value Ref Range   Glucose-Capillary 160 (*) 70 - 99 mg/dL    Physical Findings: AIMS: Facial and Oral Movements Muscles of Facial Expression: None, normal Lips and Perioral Area: None, normal Jaw: None, normal Tongue: None, normal,Extremity Movements Upper (arms, wrists, hands, fingers): None, normal Lower (legs, knees, ankles, toes): None, normal, Trunk Movements Neck, shoulders, hips: None, normal, Overall Severity Severity  of abnormal movements (highest score from questions above): None, normal Incapacitation due to abnormal movements: None, normal Patient's awareness of abnormal movements (rate only patient's report): No Awareness, Dental Status Current problems with teeth and/or dentures?: No Does patient usually wear dentures?: No  CIWA:  CIWA-Ar Total: 0 COWS:     Treatment Plan Summary: Daily contact with patient to assess and evaluate symptoms and progress in treatment Medication management  Plan: Review of chart, vital signs, medications, and notes.  1-Individual and group therapy  2-Medication management: -Continue Geodon to 80mg  po bid for mood/ delusions -Continue  Depakote ER to 750 mg bid for mood stabilization -Initiate Trazodone 100 mg hs for insomnia.  -Continue  Ativan 1mg  po tid for agitation.  3-Coping skills for mood lability, agitation  4-Continue crisis stabilization and management  5-Address health issues-Continue Apresoline  50 mg TID, Cozaar 100 mg daily, and Toprol XL 200 mg daily for elevated blood pressure.  6-Treatment plan in progress to prevent relapse of Bipolar symptoms 7- Obtain Depakote level in the am.  Medical Decision Making Problem Points:  Established problem, slight improvement (1), Review of last therapy session (1) and Review of psycho-social stressors (1) Data Points:  Review or order clinical lab tests (1) Review of medication regiment & side effects (2) Review of new medications or change in dosage (2)  I certify that inpatient services furnished can reasonably be expected to improve the patient's condition.  [name], NP-C 05/31/2014, 10:50 AM  Patient seen, evaluated and I agree with notes by Nurse Practitioner. Thedore Mins, MD

## 2014-06-01 DIAGNOSIS — I10 Essential (primary) hypertension: Secondary | ICD-10-CM | POA: Diagnosis present

## 2014-06-01 DIAGNOSIS — F3110 Bipolar disorder, current episode manic without psychotic features, unspecified: Principal | ICD-10-CM

## 2014-06-01 LAB — GLUCOSE, CAPILLARY
Glucose-Capillary: 163 mg/dL — ABNORMAL HIGH (ref 70–99)
Glucose-Capillary: 250 mg/dL — ABNORMAL HIGH (ref 70–99)
Glucose-Capillary: 177 mg/dL — ABNORMAL HIGH (ref 70–99)
Glucose-Capillary: 167 mg/dL — ABNORMAL HIGH (ref 70–99)

## 2014-06-01 LAB — COMPREHENSIVE METABOLIC PANEL
ALT: 49 U/L (ref 0–53)
AST: 34 U/L (ref 0–37)
Albumin: 3.6 g/dL (ref 3.5–5.2)
Alkaline Phosphatase: 95 U/L (ref 39–117)
Anion gap: 11 (ref 5–15)
BUN: 23 mg/dL (ref 6–23)
CO2: 29 mEq/L (ref 19–32)
Calcium: 9.6 mg/dL (ref 8.4–10.5)
Chloride: 96 mEq/L (ref 96–112)
Creatinine, Ser: 1.44 mg/dL — ABNORMAL HIGH (ref 0.50–1.35)
GFR calc Af Amer: 60 mL/min — ABNORMAL LOW (ref >90)
GFR calc non Af Amer: 51 mL/min — ABNORMAL LOW (ref >90)
Glucose, Bld: 180 mg/dL — ABNORMAL HIGH (ref 70–99)
Potassium: 3.9 mEq/L (ref 3.7–5.3)
Sodium: 136 mEq/L — ABNORMAL LOW (ref 137–147)
Total Bilirubin: 0.6 mg/dL (ref 0.3–1.2)
Total Protein: 7.6 g/dL (ref 6.0–8.3)

## 2014-06-01 LAB — VALPROIC ACID LEVEL: Valproic Acid Lvl: 62.4 ug/mL (ref 50.0–100.0)

## 2014-06-01 MED ORDER — DIVALPROEX SODIUM 500 MG PO DR TAB
500.0000 mg | DELAYED_RELEASE_TABLET | ORAL | Status: DC
  Administered 2014-06-02 – 2014-06-04 (×3): 500 mg via ORAL
  Filled 2014-06-01 (×4): qty 1

## 2014-06-01 MED ORDER — TRAZODONE HCL 100 MG PO TABS
200.0000 mg | ORAL_TABLET | Freq: Every day | ORAL | Status: DC
  Administered 2014-06-01 – 2014-06-04 (×4): 200 mg via ORAL
  Filled 2014-06-01: qty 2
  Filled 2014-06-01: qty 28
  Filled 2014-06-01 (×5): qty 2

## 2014-06-01 MED ORDER — DIVALPROEX SODIUM ER 500 MG PO TB24
1500.0000 mg | ORAL_TABLET | Freq: Every day | ORAL | Status: DC
  Administered 2014-06-01 – 2014-06-03 (×3): 1500 mg via ORAL
  Filled 2014-06-01 (×5): qty 3

## 2014-06-01 NOTE — Progress Notes (Signed)
Patient ID: Christian [name], male   DOB: November 18, 1951, 63 y.o.   MRN: [id number] D. Patient presents with irritable mood, affect labile again today. Rodrick continues to present with disorganized thought process, and he remains grandiose.[place] continues to be grandiose stating to staff '' I can take care of all of you, I'm a famous recording artist, I'm going to give you 15,000 dollars, and here are a few contracts I want you to sign too. '' Patient remains very focused on discharge, and it was explained to the patient that he is not slated to be discharged today. He becomes irritable and agitated stating '' I'm going to have to bust up out of here. Get my a public defender '' Kavarion continues to show little improvement. A. Medications given as ordered. Noted elevated B/P and discussed above information with N. Mashburn PAC. R. Patient has been visible on the unit, he remains intrusive and disorganized at times , but is able to follow redirection. No further voiced concerns at this time. Will continue to monitor.

## 2014-06-01 NOTE — Progress Notes (Signed)
Patient ID: Christian [name], male   DOB: 13-Oct-1951, 63 y.o.   MRN: [id number] Patient ID: Christian [name], male   DOB: 1951/04/16, 63 y.o.   MRN: [id number]  [facility] MD Progress Note  06/01/2014 9:45 AM Christian [name]  MRN:  [id number]  Subjective: Patient is seen and the chart is reviewed. He is pressured and rapid as well as circumstantial in his speech. He can not recall all of the medications he has taken in the past but notes that his regular psychiatrist Letta Moynahan is out of town and if she were intown he would not be in the hospital.  Objective: Patient is seen and chart reviewed. Vaishnav is pressured and hyperverbal. He is grandiose, but cooperative and informative. He is easy to return to topic.  He denies SI/HI Or AVI.  He is fighting sleep he reports.  Diagnosis:   DSM5: Bipolar disorder  Total Time spent with patient: 20 minutes  Axis I: Bipolar 1 disorder -current episode manic  Axis II: Deferred Axis III:  Past Medical History  Diagnosis Date  . Gout   . Hypertension    Axis IV: other psychosocial or environmental problems and problems related to social environment Axis V: 41-50 serious symptoms  ADL's: Intact  Sleep: Fair  Appetite: Fair  Suicidal Ideation:  Denies Homicidal Ideation:  Denies AEB (as evidenced by):  Psychiatric Specialty Exam: Physical Exam  Psychiatric: His affect is labile. His speech is rapid and/or pressured and tangential. He is agitated. Thought content is delusional. Cognition and memory are normal. He expresses impulsivity.    Review of Systems  Constitutional: Negative.   HENT: Negative.   Eyes: Negative.   Respiratory: Negative.   Cardiovascular: Negative.   Gastrointestinal: Negative.   Genitourinary: Negative.   Musculoskeletal: Negative.   Skin: Negative.   Neurological: Negative.   Endo/Heme/Allergies: Negative.   Psychiatric/Behavioral: The patient is nervous/anxious and has insomnia.     Blood pressure  177/100, pulse 96, temperature 97.6 F (36.4 C), temperature source Oral, resp. rate 18, height 6' (1.829 m), weight 282 lb (127.914 kg), SpO2 99.00%.Body mass index is 38.24 kg/(m^2).   General Appearance: Fairly Groomed   Engineer, water:: Fair   Speech: Pressured   Volume: fluctuates,increased at times   Mood: Anxious and Irritable   Affect: Labile   Thought Process: Circumstantial and Tangential   Orientation: Full (Time, Place, and Person)   Thought Content: Rumination  Suicidal Thoughts: No   Homicidal Thoughts: No   Memory: Immediate; Fair  Recent; Fair  Remote; Fair   Judgement: Impaired   Insight: Shallow  Psychomotor Activity: Increased and Restlessness   Concentration: Poor   Recall: [facility]   Language: Fair   Akathisia: No   Handed: Right  AIMS (if indicated):   Assets: Housing  Social Support   Sleep: Number of Hours: 2   Musculoskeletal:  Strength & Muscle Tone: within normal limits  Gait & Station: normal  Patient leans: N/A  Current Medications: Current Facility-Administered Medications  Medication Dose Route Frequency Provider Last Rate Last Dose  . acetaminophen (TYLENOL) tablet 650 mg  650 mg Oral Q6H PRN Lurena Nida, NP      . allopurinol (ZYLOPRIM) tablet 300 mg  300 mg Oral Daily Lurena Nida, NP   300 mg at 06/01/14 3785  . alum & mag hydroxide-simeth (MAALOX/MYLANTA) 200-200-20 MG/5ML suspension 30 mL  30 mL Oral Q4H PRN Lurena Nida, NP      .  atorvastatin (LIPITOR) tablet 10 mg  10 mg Oral q1800 Lurena Nida, NP   10 mg at 05/31/14 1705  . colchicine tablet 0.6 mg  0.6 mg Oral Daily Lurena Nida, NP   0.6 mg at 06/01/14 0809  . divalproex (DEPAKOTE) DR tablet 750 mg  750 mg Oral Q12H [name]   750 mg at 06/01/14 0808  . furosemide (LASIX) tablet 80 mg  80 mg Oral QAC breakfast Lurena Nida, NP   80 mg at 06/01/14 8185   And  . furosemide (LASIX) tablet 40 mg  40 mg Oral QPC supper Lurena Nida, NP   40 mg at  05/31/14 1705  . glipiZIDE (GLUCOTROL XL) 24 hr tablet 10 mg  10 mg Oral BID WC Lurena Nida, NP   10 mg at 06/01/14 0809  . guaiFENesin (MUCINEX) 12 hr tablet 600 mg  600 mg Oral BID PRN Elmarie Shiley, NP   600 mg at 05/31/14 2141  . hydrALAZINE (APRESOLINE) tablet 50 mg  50 mg Oral 3 times per day [name]   50 mg at 06/01/14 0620  . ibuprofen (ADVIL,MOTRIN) tablet 600 mg  600 mg Oral Q8H PRN Lurena Nida, NP   600 mg at 05/26/14 2213  . insulin aspart (novoLOG) injection 0-9 Units  0-9 Units Subcutaneous TID WC Benjamine Mola, FNP   2 Units at 06/01/14 [phone]  . linagliptin (TRADJENTA) tablet 5 mg  5 mg Oral Daily Lurena Nida, NP   5 mg at 06/01/14 0809  . LORazepam (ATIVAN) tablet 1 mg  1 mg Oral TID [name]   1 mg at 06/01/14 0813  . losartan (COZAAR) tablet 100 mg  100 mg Oral Daily Lurena Nida, NP   100 mg at 06/01/14 0809  . magnesium hydroxide (MILK OF MAGNESIA) suspension 30 mL  30 mL Oral Daily PRN Lurena Nida, NP   30 mL at 05/27/14 2033  . menthol-cetylpyridinium (CEPACOL) lozenge 3 mg  1 lozenge Oral PRN Elmarie Shiley, NP   3 mg at 05/30/14 1500  . metoprolol succinate (TOPROL-XL) 24 hr tablet 200 mg  200 mg Oral Daily Lurena Nida, NP   200 mg at 06/01/14 9702  . OLANZapine zydis (ZYPREXA) disintegrating tablet 10 mg  10 mg Oral Q8H PRN [name]      . ondansetron (ZOFRAN) tablet 4 mg  4 mg Oral Q8H PRN Lurena Nida, NP      . traZODone (DESYREL) tablet 100 mg  100 mg Oral QHS Elmarie Shiley, NP   100 mg at 05/31/14 2141  . ziprasidone (GEODON) capsule 80 mg  80 mg Oral BID WC [name]   80 mg at 06/01/14 6378    Lab Results:  Results for orders placed during the hospital encounter of 05/25/14 (from the past 48 hour(s))  GLUCOSE, CAPILLARY     Status: Abnormal   Collection Time    05/30/14 12:02 PM      Result Value Ref Range   Glucose-Capillary 241 (*) 70 - 99 mg/dL  GLUCOSE, CAPILLARY     Status: Abnormal   Collection Time    05/30/14  4:25 PM       Result Value Ref Range   Glucose-Capillary 280 (*) 70 - 99 mg/dL  GLUCOSE, CAPILLARY     Status: Abnormal   Collection Time    05/30/14  9:35 PM      Result Value Ref Range   Glucose-Capillary 247 (*)  70 - 99 mg/dL   Comment 1 Notify RN     Comment 2 Documented in Chart    GLUCOSE, CAPILLARY     Status: Abnormal   Collection Time    05/31/14  5:48 AM      Result Value Ref Range   Glucose-Capillary 160 (*) 70 - 99 mg/dL  GLUCOSE, CAPILLARY     Status: Abnormal   Collection Time    05/31/14 12:14 PM      Result Value Ref Range   Glucose-Capillary 273 (*) 70 - 99 mg/dL  GLUCOSE, CAPILLARY     Status: Abnormal   Collection Time    05/31/14  4:41 PM      Result Value Ref Range   Glucose-Capillary 127 (*) 70 - 99 mg/dL  GLUCOSE, CAPILLARY     Status: Abnormal   Collection Time    05/31/14  8:44 PM      Result Value Ref Range   Glucose-Capillary 211 (*) 70 - 99 mg/dL  GLUCOSE, CAPILLARY     Status: Abnormal   Collection Time    05/31/14  9:14 PM      Result Value Ref Range   Glucose-Capillary 204 (*) 70 - 99 mg/dL  GLUCOSE, CAPILLARY     Status: Abnormal   Collection Time    06/01/14  6:25 AM      Result Value Ref Range   Glucose-Capillary 163 (*) 70 - 99 mg/dL  COMPREHENSIVE METABOLIC PANEL     Status: Abnormal   Collection Time    06/01/14  6:52 AM      Result Value Ref Range   Sodium 136 (*) 137 - 147 mEq/L   Potassium 3.9  3.7 - 5.3 mEq/L   Chloride 96  96 - 112 mEq/L   CO2 29  19 - 32 mEq/L   Glucose, Bld 180 (*) 70 - 99 mg/dL   BUN 23  6 - 23 mg/dL   Creatinine, Ser 1.44 (*) 0.50 - 1.35 mg/dL   Calcium 9.6  8.4 - 10.5 mg/dL   Total Protein 7.6  6.0 - 8.3 g/dL   Albumin 3.6  3.5 - 5.2 g/dL   AST 34  0 - 37 U/L   ALT 49  0 - 53 U/L   Alkaline Phosphatase 95  39 - 117 U/L   Total Bilirubin 0.6  0.3 - 1.2 mg/dL   GFR calc non Af Amer 51 (*) >90 mL/min   GFR calc Af Amer 60 (*) >90 mL/min   Comment: (NOTE)     The eGFR has been calculated using the CKD EPI  equation.     This calculation has not been validated in all clinical situations.     eGFR's persistently <90 mL/min signify possible Chronic Kidney     Disease.   Anion gap 11  5 - 15   Comment: Performed at [facility]  VALPROIC ACID LEVEL     Status: None   Collection Time    06/01/14  6:52 AM      Result Value Ref Range   Valproic Acid Lvl 62.4  50.0 - 100.0 ug/mL   Comment: Performed at [facility]    Physical Findings: AIMS: Facial and Oral Movements Muscles of Facial Expression: None, normal Lips and Perioral Area: None, normal Jaw: None, normal Tongue: None, normal,Extremity Movements Upper (arms, wrists, hands, fingers): None, normal Lower (legs, knees, ankles, toes): None, normal, Trunk Movements Neck, shoulders, hips: None, normal, Overall Severity Severity  of abnormal movements (highest score from questions above): None, normal Incapacitation due to abnormal movements: None, normal Patient's awareness of abnormal movements (rate only patient's report): No Awareness, Dental Status Current problems with teeth and/or dentures?: No Does patient usually wear dentures?: No  CIWA:  CIWA-Ar Total: 0 COWS:     Treatment Plan Summary: Daily contact with patient to assess and evaluate symptoms and progress in treatment Medication management  Plan: NEW: 1. Will give higher dose of depakote at hs., 1534m at hs, continue 500 in AM. 2. Will double the trazodone to 207mat hs. 3. Will get EKG as he is diabetic, hypertensive, AA male on psychotropic medication. 4. Will repeat BMP in 24 hours to evaluate hyponatremia. 5. Will monitor for hypergylcemia. Review of chart, vital signs, medications, and notes.  1-Individual and group therapy  2-Medication management: -Continue Geodon to 8051mo bid for mood/ delusions -Continue  Depakote ER to 750 mg bid for mood stabilization --Continue  Ativan 1mg65m tid for agitation.  3-Coping skills for mood  lability, agitation  4-Continue crisis stabilization and management  5-Address health issues-Continue Apresoline  50 mg TID, Cozaar 100 mg daily, and Toprol XL 200 mg daily for elevated blood pressure.  6-Treatment plan in progress to prevent relapse of Bipolar symptoms 7- Obtain Depakote level in the am.  Medical Decision Making Problem Points:  Established problem, slight improvement (1), Review of last therapy session (1) and Review of psycho-social stressors (1) Data Points:  Review or order clinical lab tests (1) Review of medication regiment & side effects (2) Review of new medications or change in dosage (2)  I certify that inpatient services furnished can reasonably be expected to improve the patient's condition.  NeilMarlane Hatchershburn RPAC 4:06 PM 06/01/2014 I agree with assessment and plan IrviGeralyn FlashLugoSabra HeckD.

## 2014-06-01 NOTE — [facility] Group Notes (Signed)
[facility] Group Notes:  (Nursing/MHT/Case Management/Adjunct)  Date:  06/01/2014  Time:  11:21 AM  Type of Therapy:  Coping Skills  Participation Level:  Active  Participation Quality:  Intrusive  Affect:  Appropriate  Cognitive:  Alert  Insight:  Improving  Engagement in Group:  Monopolizing  Modes of Intervention:  Clarification  Summary of Progress/Problems:  Loren Racer 06/01/2014, 11:21 AM

## 2014-06-01 NOTE — Progress Notes (Signed)
The patient attended Wrap-Up Group, but slept for the entire duration of the group.

## 2014-06-01 NOTE — Progress Notes (Signed)
Patient ID: Christian [name], male   DOB: 12/21/1950, 63 y.o.   MRN: [id number] D: Patient denies suicidal /homicidal ideation intent and plan. Pt still disorganized but redirectable.  Pt is calm on unit intrusive at time but cooperative. Cooperative with assessment. No acute distressed noted at this time.   A: Met with pt 1:1. Medications administered as prescribed. Writer encouraged pt to discuss feelings. Pt encouraged to come to staff with any questions or concerns.   R: Patient is safe on the unit. He is complaint with medications and denies any adverse reaction. Continue current POC.

## 2014-06-01 NOTE — [facility] Group Notes (Signed)
[facility] Group Notes:  (Nursing/MHT/Case Management/Adjunct)  Date:  06/01/2014  Time:  11:19 AM  Type of Therapy:  Self Inventory  Participation Level:  Active  Participation Quality:  Intrusive  Affect:  Appropriate  Cognitive:  Alert  Insight:  Improving  Engagement in Group:  Monopolizing  Modes of Intervention:  Exploration  Summary of Progress/Problems: Intrusive. Interrupts others. Focuses on everyones issues and concerns.  Insight slowly improving.  Loren Racer 06/01/2014, 11:19 AM

## 2014-06-01 NOTE — [facility] Group Notes (Signed)
[facility] Group Notes: (Clinical Social Work)   06/01/2014      Type of Therapy:  Group Therapy   Participation Level:  Did Not Attend    Ambrose Mantle, LCSW 06/01/2014, 1:08 PM

## 2014-06-02 ENCOUNTER — Encounter (HOSPITAL_COMMUNITY): Payer: Self-pay | Admitting: Internal Medicine

## 2014-06-02 DIAGNOSIS — G4733 Obstructive sleep apnea (adult) (pediatric): Secondary | ICD-10-CM | POA: Diagnosis present

## 2014-06-02 DIAGNOSIS — E118 Type 2 diabetes mellitus with unspecified complications: Secondary | ICD-10-CM | POA: Diagnosis present

## 2014-06-02 DIAGNOSIS — R600 Localized edema: Secondary | ICD-10-CM

## 2014-06-02 DIAGNOSIS — IMO0002 Reserved for concepts with insufficient information to code with codable children: Secondary | ICD-10-CM

## 2014-06-02 DIAGNOSIS — Z9989 Dependence on other enabling machines and devices: Secondary | ICD-10-CM

## 2014-06-02 DIAGNOSIS — E1165 Type 2 diabetes mellitus with hyperglycemia: Secondary | ICD-10-CM

## 2014-06-02 DIAGNOSIS — E785 Hyperlipidemia, unspecified: Secondary | ICD-10-CM

## 2014-06-02 DIAGNOSIS — M109 Gout, unspecified: Secondary | ICD-10-CM | POA: Diagnosis present

## 2014-06-02 HISTORY — DX: Obstructive sleep apnea (adult) (pediatric): G47.33

## 2014-06-02 HISTORY — DX: Reserved for concepts with insufficient information to code with codable children: IMO0002

## 2014-06-02 HISTORY — DX: Hyperlipidemia, unspecified: E78.5

## 2014-06-02 HISTORY — DX: Type 2 diabetes mellitus with hyperglycemia: E11.65

## 2014-06-02 HISTORY — DX: Localized edema: R60.0

## 2014-06-02 LAB — BASIC METABOLIC PANEL
Anion gap: 10 (ref 5–15)
BUN: 26 mg/dL — ABNORMAL HIGH (ref 6–23)
CO2: 32 mEq/L (ref 19–32)
Calcium: 9.5 mg/dL (ref 8.4–10.5)
Chloride: 92 mEq/L — ABNORMAL LOW (ref 96–112)
Creatinine, Ser: 1.59 mg/dL — ABNORMAL HIGH (ref 0.50–1.35)
GFR calc Af Amer: 53 mL/min — ABNORMAL LOW (ref >90)
GFR calc non Af Amer: 46 mL/min — ABNORMAL LOW (ref >90)
Glucose, Bld: 130 mg/dL — ABNORMAL HIGH (ref 70–99)
Potassium: 4.1 mEq/L (ref 3.7–5.3)
Sodium: 134 mEq/L — ABNORMAL LOW (ref 137–147)

## 2014-06-02 LAB — GLUCOSE, CAPILLARY
Glucose-Capillary: 154 mg/dL — ABNORMAL HIGH (ref 70–99)
Glucose-Capillary: 201 mg/dL — ABNORMAL HIGH (ref 70–99)
Glucose-Capillary: 150 mg/dL — ABNORMAL HIGH (ref 70–99)
Glucose-Capillary: 127 mg/dL — ABNORMAL HIGH (ref 70–99)

## 2014-06-02 LAB — MAGNESIUM: Magnesium: 1.7 mg/dL (ref 1.5–2.5)

## 2014-06-02 LAB — ALBUMIN: Albumin: 3.5 g/dL (ref 3.5–5.2)

## 2014-06-02 MED ORDER — INSULIN ASPART 100 UNIT/ML ~~LOC~~ SOLN
3.0000 [IU] | Freq: Three times a day (TID) | SUBCUTANEOUS | Status: DC
  Administered 2014-06-02 – 2014-06-05 (×9): 3 [IU] via SUBCUTANEOUS

## 2014-06-02 MED ORDER — ARIPIPRAZOLE 15 MG PO TABS
15.0000 mg | ORAL_TABLET | Freq: Every day | ORAL | Status: DC
  Administered 2014-06-02 – 2014-06-05 (×4): 15 mg via ORAL
  Filled 2014-06-02 (×6): qty 1
  Filled 2014-06-02: qty 14

## 2014-06-02 NOTE — Progress Notes (Signed)
Patient ID: AMAJE IGOU, male   DOB: 1951/04/06, 63 y.o.   MRN: [id number] Psychoeducational Group Note  Date:  06/02/2014 Time:  0910  Group Topic/Focus:  inventory group   Participation Level: Did Not Attend  Participation Quality:  Not Applicable  Affect:  Not Applicable  Cognitive:  Not Applicable  Insight:  Not Applicable  Engagement in Group: Not Applicable  Additional Comments:  Did not attend.   Valente David 06/02/2014, 1:35 PM

## 2014-06-02 NOTE — Progress Notes (Addendum)
NUTRITION ASSESSMENT  Pt identified as at risk on the Malnutrition Screen Tool  INTERVENTION: 1. Educated patient on the importance of nutrition and encouraged intake of food and beverages. 2. Discussed weight goals. 3. Supplements: none at this time.  NUTRITION DIAGNOSIS: Obesity related to increased caloric intake AEB BMI of 38. Goal: Pt to meet >/= 90% of their estimated nutrition needs.  Monitor:  PO intake  Assessment:  Patient admitted with bipolar.   Reports weight of 386 3 years ago with purposeful weight loss to current weight.  Wants to lose to 250 lbs.   Good intake currently and prior to admit.  63 y.o. male  Height: Ht Readings from Last 1 Encounters:  05/25/14 6' (1.829 m)    Weight: Wt Readings from Last 1 Encounters:  05/25/14 282 lb (127.914 kg)    Weight Hx: Wt Readings from Last 10 Encounters:  05/25/14 282 lb (127.914 kg)    BMI:  Body mass index is 38.24 kg/(m^2). Pt meets criteria for obesity grade 2  based on current BMI. Large frame  Estimated Nutritional Needs: Kcal: 25-30 kcal/kg Protein: > 1 gram protein/kg Fluid: 1 ml/kcal  Diet Order:   Pt is also offered choice of unit snacks mid-morning and mid-afternoon.  Pt is eating as desired.   Lab results and medications reviewed.   Oran Rein, RD, LDN Clinical Inpatient Dietitian Pager:  [phone] Weekend and after hours pager:  [phone]

## 2014-06-02 NOTE — Progress Notes (Signed)
D: Patient denies SI/HI/AVH. Patient affect is and mood are anxious.  Patient did attend evening group, however he slept through the it's entirety. Patient visible on the milieu. No distress noted. A: Support and encouragement offered. Scheduled medications given to pt. Q 15 min checks continued for patient safety. R: Patient receptive. Patient remains safe on the unit.

## 2014-06-02 NOTE — Progress Notes (Signed)
D: Pt denies SI/HI/AVH. Pt is pleasant and cooperative. Pt appears less manic, but continues to be intrusive and has a problem with boundaries. Pt stated he had a "great day". He resolved som issues with his family.   A: Pt was offered support and encouragement. Pt was given scheduled medications. Pt was encourage to attend groups. Q 15 minute checks were done for safety.   R:Pt  Did not attend group, but and interacts well with peers and staff. Pt is taking medication. Pt has no complaints at this time.Pt receptive to treatment and safety maintained on unit.

## 2014-06-02 NOTE — Progress Notes (Signed)
Patient ID: Christian [name], male   DOB: [birth year], 63 y.o.   MRN: [id number]  [facility] MD Progress Note  06/02/2014 10:45 AM Meshilem Machuca Syfert  MRN:  [id number]  Subjective: Patient is seen and the chart is reviewed. Patient is drowsy and sleepy today due to the increase in his Trazodone last night. He can not keep his eyes open during the discussion. Objective: . Patient's chart was reviewed and EKG was done. QTc interval was  467m but not read as prolonged by reviewer.  However it is still an abnormal EKG. MMAXTYN, NUZUMIXN:[id number] 16:20:56Normal sinus rhythm Left axis deviation Inferior infarct , age undetermined Abnormal ECG Slightly prominant J point elevation is anterior lateral leads when compared to prior 221ms 1073mV '100Hz'  8.0.1 12SL 241 HD CID: 97 Vent. rate 90 BPM PR interval 188 ms QRS duration 94 ms QT/QTc 368/450 ms P-R-T axes 54 -43 20 Patient is excused to his room due to drowsiness and will allow him to sleep. Diagnosis:   DSM5: Bipolar disorder  Total Time spent with patient: 20 minutes  Axis I: Bipolar 1 disorder -current episode manic  Axis II: Deferred Axis III:  Past Medical History  Diagnosis Date  . Gout   . Hypertension    Axis IV: other psychosocial or environmental problems and problems related to social environment Axis V: 41-50 serious symptoms  ADL's: Intact  Sleep: Fair  Appetite: Fair  Suicidal Ideation:  Denies Homicidal Ideation:  Denies AEB (as evidenced by):  Psychiatric Specialty Exam: Physical Exam  Psychiatric: His affect is labile. His speech is rapid and/or pressured and tangential. He is agitated. Thought content is delusional. Cognition and memory are normal. He expresses impulsivity.    Review of Systems  Constitutional: Negative.   HENT: Negative.   Eyes: Negative.   Respiratory: Negative.   Cardiovascular: Negative.   Gastrointestinal: Negative.   Genitourinary: Negative.   Musculoskeletal:  Negative.   Skin: Negative.   Neurological: Negative.   Endo/Heme/Allergies: Negative.   Psychiatric/Behavioral: The patient is nervous/anxious and has insomnia.     Blood pressure 140/93, pulse 91, temperature 98 F (36.7 C), temperature source Oral, resp. rate 20, height 6' (1.829 m), weight 127.914 kg (282 lb), SpO2 99.00%.Body mass index is 38.24 kg/(m^2).   General Appearance: Fairly Groomed   EyeEngineer, waterFair   Speech: Pressured   Volume: fluctuates,increased at times   Mood: Anxious and Irritable   Affect: Labile   Thought Process: Circumstantial and Tangential   Orientation: Full (Time, Place, and Person)   Thought Content: Rumination  Suicidal Thoughts: No   Homicidal Thoughts: No   Memory: Immediate; Fair  Recent; Fair  Remote; Fair   Judgement: Impaired   Insight: Shallow  Psychomotor Activity: Increased and Restlessness   Concentration: Poor   Recall: [place]: Fair   Akathisia: No   Handed: Right  AIMS (if indicated):   Assets: Housing  Social Support   Sleep: Number of Hours: 2   Musculoskeletal:  Strength & Muscle Tone: within normal limits  Gait & Station: normal  Patient leans: N/A  Current Medications: Current Facility-Administered Medications  Medication Dose Route Frequency Provider Last Rate Last Dose  . acetaminophen (TYLENOL) tablet 650 mg  650 mg Oral Q6H PRN FraLurena NidaP      . allopurinol (ZYLOPRIM) tablet 300 mg  300 mg Oral Daily FraLurena NidaP   300 mg at 06/02/14 0747  . alum &  mag hydroxide-simeth (MAALOX/MYLANTA) 200-200-20 MG/5ML suspension 30 mL  30 mL Oral Q4H PRN Lurena Nida, NP      . atorvastatin (LIPITOR) tablet 10 mg  10 mg Oral q1800 Lurena Nida, NP   10 mg at 06/01/14 1701  . colchicine tablet 0.6 mg  0.6 mg Oral Daily Lurena Nida, NP   0.6 mg at 06/02/14 0748  . divalproex (DEPAKOTE ER) 24 hr tablet 1,500 mg  1,500 mg Oral QHS Nena Polio, PA-C   1,500 mg at 06/01/14 2108  .  divalproex (DEPAKOTE) DR tablet 500 mg  500 mg Oral BH-q7a Nena Polio, PA-C   500 mg at 06/02/14 9476  . furosemide (LASIX) tablet 80 mg  80 mg Oral QAC breakfast Lurena Nida, NP   80 mg at 06/02/14 5465   And  . furosemide (LASIX) tablet 40 mg  40 mg Oral QPC supper Lurena Nida, NP   40 mg at 06/01/14 1701  . glipiZIDE (GLUCOTROL XL) 24 hr tablet 10 mg  10 mg Oral BID WC Lurena Nida, NP   10 mg at 06/02/14 0748  . guaiFENesin (MUCINEX) 12 hr tablet 600 mg  600 mg Oral BID PRN Elmarie Shiley, NP   600 mg at 05/31/14 2141  . hydrALAZINE (APRESOLINE) tablet 50 mg  50 mg Oral 3 times per day [name]   50 mg at 06/02/14 0600  . ibuprofen (ADVIL,MOTRIN) tablet 600 mg  600 mg Oral Q8H PRN Lurena Nida, NP   600 mg at 05/26/14 2213  . insulin aspart (novoLOG) injection 0-9 Units  0-9 Units Subcutaneous TID WC Benjamine Mola, FNP   2 Units at 06/02/14 [phone]  . linagliptin (TRADJENTA) tablet 5 mg  5 mg Oral Daily Lurena Nida, NP   5 mg at 06/02/14 0748  . LORazepam (ATIVAN) tablet 1 mg  1 mg Oral TID [name]   1 mg at 06/02/14 0748  . losartan (COZAAR) tablet 100 mg  100 mg Oral Daily Lurena Nida, NP   100 mg at 06/02/14 0748  . magnesium hydroxide (MILK OF MAGNESIA) suspension 30 mL  30 mL Oral Daily PRN Lurena Nida, NP   30 mL at 05/27/14 2033  . menthol-cetylpyridinium (CEPACOL) lozenge 3 mg  1 lozenge Oral PRN Elmarie Shiley, NP   3 mg at 05/30/14 1500  . metoprolol succinate (TOPROL-XL) 24 hr tablet 200 mg  200 mg Oral Daily Lurena Nida, NP   200 mg at 06/02/14 0748  . ondansetron (ZOFRAN) tablet 4 mg  4 mg Oral Q8H PRN Lurena Nida, NP      . traZODone (DESYREL) tablet 200 mg  200 mg Oral QHS Nena Polio, PA-C   200 mg at 06/01/14 2108    Lab Results:  Results for orders placed during the hospital encounter of 05/25/14 (from the past 48 hour(s))  GLUCOSE, CAPILLARY     Status: Abnormal   Collection Time    05/31/14 12:14 PM      Result Value Ref Range    Glucose-Capillary 273 (*) 70 - 99 mg/dL  GLUCOSE, CAPILLARY     Status: Abnormal   Collection Time    05/31/14  4:41 PM      Result Value Ref Range   Glucose-Capillary 127 (*) 70 - 99 mg/dL  GLUCOSE, CAPILLARY     Status: Abnormal   Collection Time    05/31/14  8:44 PM      Result  Value Ref Range   Glucose-Capillary 211 (*) 70 - 99 mg/dL  GLUCOSE, CAPILLARY     Status: Abnormal   Collection Time    05/31/14  9:14 PM      Result Value Ref Range   Glucose-Capillary 204 (*) 70 - 99 mg/dL  GLUCOSE, CAPILLARY     Status: Abnormal   Collection Time    06/01/14  6:25 AM      Result Value Ref Range   Glucose-Capillary 163 (*) 70 - 99 mg/dL  COMPREHENSIVE METABOLIC PANEL     Status: Abnormal   Collection Time    06/01/14  6:52 AM      Result Value Ref Range   Sodium 136 (*) 137 - 147 mEq/L   Potassium 3.9  3.7 - 5.3 mEq/L   Chloride 96  96 - 112 mEq/L   CO2 29  19 - 32 mEq/L   Glucose, Bld 180 (*) 70 - 99 mg/dL   BUN 23  6 - 23 mg/dL   Creatinine, Ser 1.44 (*) 0.50 - 1.35 mg/dL   Calcium 9.6  8.4 - 10.5 mg/dL   Total Protein 7.6  6.0 - 8.3 g/dL   Albumin 3.6  3.5 - 5.2 g/dL   AST 34  0 - 37 U/L   ALT 49  0 - 53 U/L   Alkaline Phosphatase 95  39 - 117 U/L   Total Bilirubin 0.6  0.3 - 1.2 mg/dL   GFR calc non Af Amer 51 (*) >90 mL/min   GFR calc Af Amer 60 (*) >90 mL/min   Comment: (NOTE)     The eGFR has been calculated using the CKD EPI equation.     This calculation has not been validated in all clinical situations.     eGFR's persistently <90 mL/min signify possible Chronic Kidney     Disease.   Anion gap 11  5 - 15   Comment: Performed at [facility]  VALPROIC ACID LEVEL     Status: None   Collection Time    06/01/14  6:52 AM      Result Value Ref Range   Valproic Acid Lvl 62.4  50.0 - 100.0 ug/mL   Comment: Performed at [place], CAPILLARY     Status: Abnormal   Collection Time    06/01/14 11:54 AM      Result Value Ref Range    Glucose-Capillary 250 (*) 70 - 99 mg/dL  GLUCOSE, CAPILLARY     Status: Abnormal   Collection Time    06/01/14  4:46 PM      Result Value Ref Range   Glucose-Capillary 177 (*) 70 - 99 mg/dL  GLUCOSE, CAPILLARY     Status: Abnormal   Collection Time    06/01/14  9:00 PM      Result Value Ref Range   Glucose-Capillary 167 (*) 70 - 99 mg/dL  GLUCOSE, CAPILLARY     Status: Abnormal   Collection Time    06/02/14  6:36 AM      Result Value Ref Range   Glucose-Capillary 154 (*) 70 - 99 mg/dL    Physical Findings: AIMS: Facial and Oral Movements Muscles of Facial Expression: None, normal Lips and Perioral Area: None, normal Jaw: None, normal Tongue: None, normal,Extremity Movements Upper (arms, wrists, hands, fingers): None, normal Lower (legs, knees, ankles, toes): None, normal, Trunk Movements Neck, shoulders, hips: None, normal, Overall Severity Severity of abnormal movements (highest score from questions above): None,  normal Incapacitation due to abnormal movements: None, normal Patient's awareness of abnormal movements (rate only patient's report): No Awareness, Dental Status Current problems with teeth and/or dentures?: No Does patient usually wear dentures?: No  CIWA:  CIWA-Ar Total: 0 COWS:     Treatment Plan Summary: Daily contact with patient to assess and evaluate symptoms and progress in treatment Medication management  Plan: NEW: 1. Labs are reviewed. Will get Mg and Ca levels. 2. D/C Geodon due to increased risk for prolonged QT. 3. Will consider Latuda for replacement if no contraindications. 4. Will consult IM for further coverage of his metabolic status and risk factors.   Review of chart, vital signs, medications, and notes.  1-Individual and group therapy  2-Medication management: -Continue Geodon to 62m po bid for mood/ delusions -Continue  Depakote ER to 750 mg bid for mood stabilization --Continue  Ativan 167mpo tid for agitation.  3-Coping  skills for mood lability, agitation  4-Continue crisis stabilization and management  5-Address health issues-Continue Apresoline  50 mg TID, Cozaar 100 mg daily, and Toprol XL 200 mg daily for elevated blood pressure.  6-Treatment plan in progress to prevent relapse of Bipolar symptoms 7- Obtain Depakote level in the am.  Medical Decision Making Problem Points:  Established problem, slight improvement (1)                               New problem will need further work up.  ,Data Points:  Review or order clinical lab tests (1) Review of medication regiment & side effects (2) Review of new medications or change in dosage (2)  I certify that inpatient services furnished can reasonably be expected to improve the patient's condition.  NeMarlane HatcherMashburn RPAC 10:45 AM 06/02/2014 I agree with assessment and plan IrGeralyn Flash. LuSabra HeckM.D.

## 2014-06-02 NOTE — Progress Notes (Signed)
Patient ID: Christian [name], male   DOB: 10/31/1951, 63 y.o.   MRN: [id number] Psychoeducational Group Note  Date:  06/02/2014 Time:  0930  Group Topic/Focus:  healthy support systems.   Participation Level: Did Not Attend  Participation Quality:  Not Applicable  Affect:  Not Applicable  Cognitive:  Not Applicable  Insight:  Not Applicable  Engagement in Group: Not Applicable  Additional Comments:  Did not attend.   Christian [name] 06/02/2014, 1:35 PM

## 2014-06-02 NOTE — [facility] Group Notes (Signed)
[facility] Group Notes: (Clinical Social Work)   06/02/2014      Type of Therapy:  Group Therapy   Participation Level:  Did Not Attend    Ambrose Mantle, LCSW 06/02/2014, 1:15 PM

## 2014-06-02 NOTE — Progress Notes (Signed)
Psychoeducational Group Note  Date:  06/02/2014 Time: 2223  Group Topic/Focus:  Wrap-Up Group:   The focus of this group is to help patients review their daily goal of treatment and discuss progress on daily workbooks.  Participation Level: Did Not Attend  Participation Quality:  Not Applicable  Affect:  Not Applicable  Cognitive:  Not Applicable  Insight:  Not Applicable  Engagement in Group: Not Applicable  Additional Comments:  The patient refused to attend group this evening.   Hazle Coca S 06/02/2014, 10:23 PM

## 2014-06-02 NOTE — Consult Note (Signed)
[facility] Medical Consultation  Christian [name] Hence AVW:[id number] DOB: 02/22/1951 DOA: 05/25/2014 PCP: Leanor Rubenstein, MD   Requesting physician: [name], PA Date of consultation: 06/02/14 Reason for consultation: Management of chronic medical issues and borderline QTC prolongation/questionable EKG  Impression/Recommendations Principal Problem:   Bipolar affective disorder, current episode manic Active Problems:   Hypertension   Type II or unspecified type diabetes mellitus with unspecified complication, uncontrolled   Other and unspecified hyperlipidemia   OSA on CPAP   Bilateral lower extremity edema: chronic with venous stasis changes   Gout  #1 bipolar disorder with manic episode Patient is being treated by primary team. Geodon has been discontinued secondary to increased risk for prolonged QTC as current EKG with a QTC of 450. Patient being tried on another antipsychotic per primary team.  #2 EKG with QTC of 450/J-point elevation/Q waves EKG with no significant changes. No significant ST T-wave abnormalities. EKG similar to prior EKG. Patient asymptomatic. Patient denies any chest pain. I agree with discontinuing Geodon. Will need a repeat EKG one to 2 days for followup on QTC prolongation. I agree with checking a magnesium level, basic metabolic profile to followup a potassium and calcium. No further cardiac workup is needed at this time. Continue risk factor modification.  #3 poorly controlled type 2 diabetes Patient with a hemoglobin A1c of 10 on 05/25/2014. Likely secondary to medical noncompliance secondary to financial constraints per patient. Patient's CBGs have ranged from 154- 201. Continue current home regimen of glipizide, Tradjenta, sliding scale insulin. May consider meal time coverage insulin, NovoLog 3 units 3 times daily with meals. Consult with diabetic coordinator. Also care manager consult for medication assistance.  #4 hypertension Blood pressure has  improved with the addition of hydralazine. Check vital signs 3 times daily. Continue Cozaar, metoprolol, hydralazine. May uptitrate hydralazine if needed for better blood pressure control. Outpatient followup.  #5 chronic bilateral lower extremity edema/venous stasis changes Patient denies any calf tenderness. Patient with trace bilateral lower extremity edema which he states he's on Lasix for. Continue current regimen of Lasix. Check a basic metabolic profile to followup on electrolytes and renal function.  #6 gout Stable. Continue allopurinol and colchicine.  #7 obstructive sleep apnea  stable. Patient states he's not allowed to use his CPAP machine.  #8 hyperlipidemia Continue statin.   I will followup again tomorrow. Please contact me if I can be of assistance in the meanwhile. Thank you for this consultation.  Chief Complaint: Bipolar disorder with manic episode  HPI:  Christian [name] is a 63 year old African American gentleman history of diabetes, hypertension, hyperlipidemia, chronic lower extremity edema, bipolar disorder with mood swings, obstructive sleep apnea on CPAP who [facility] to exacerbation of his underlying bipolar disorder with mania. Patient denies any fevers, no chills, no chest pain, no shortness of breath, no nausea, no vomiting, no diarrhea, no constipation, no abdominal pain, no dysuria, no weakness, no melena, no hematemesis, no hematochezia. Patient did state that at times he had some financial difficulties in terms of obtaining his medications and a such has not been compliant with some of his medications. Patient was admitted to the behavioral health center where EKG obtained showed a QTC of 450. EKG showed J-point elevation and some Q waves otherwise normal sinus rhythm. We will consulted for further evaluation and management of QTC of 450, chronic medical issues, abnormal EKG. Patient denies any current chest pain or  shortness of breath. Comprehensive metabolic profile obtained  on 06/01/2014 had a creatinine of 1.44 otherwise was within normal limits. Depakote level was 62.4.  Review of Systems:  As per history of present illness otherwise negative.  Past Medical History  Diagnosis Date  . Gout   . Hypertension   . Depression   . Mood swings   . Type II or unspecified type diabetes mellitus with unspecified complication, uncontrolled 06/02/2014  . Other and unspecified hyperlipidemia 06/02/2014  . OSA on CPAP 06/02/2014  . Bilateral lower extremity edema: chronic with venous stasis changes 06/02/2014   History reviewed. No pertinent past surgical history. Social History:  reports that he has never smoked. He does not have any smokeless tobacco history on file. He reports that he does not drink alcohol or use illicit drugs.  No Known Allergies History reviewed. No pertinent family history.  Prior to Admission medications   Medication Sig Start Date End Date Taking? Authorizing Provider  allopurinol (ZYLOPRIM) 300 MG tablet Take 300 mg by mouth daily.    Historical Provider, MD  aspirin EC 81 MG tablet Take 81 mg by mouth daily.    Historical Provider, MD  atorvastatin (LIPITOR) 10 MG tablet Take 10 mg by mouth daily.    Historical Provider, MD  colchicine 0.6 MG tablet Take 0.6 mg by mouth 2 (two) times daily.     Historical Provider, MD  furosemide (LASIX) 40 MG tablet Take 40-80 mg by mouth 2 (two) times daily. Take 2 tablets in the morning and 1 tablet in the evening    Historical Provider, MD  glipiZIDE (GLUCOTROL XL) 10 MG 24 hr tablet Take 10 mg by mouth 2 (two) times daily.    Historical Provider, MD  insulin glargine (LANTUS) 100 UNIT/ML injection Inject 55 Units into the skin at bedtime.    Historical Provider, MD  latanoprost (XALATAN) 0.005 % ophthalmic solution Place 1 drop into both eyes at bedtime.    Historical Provider, MD  linagliptin (TRADJENTA) 5 MG TABS tablet Take 5 mg by mouth daily.     Historical Provider, MD  LORazepam (ATIVAN) 0.5 MG tablet Take 0.5 mg by mouth 2 (two) times daily.    Historical Provider, MD  losartan (COZAAR) 100 MG tablet Take 100 mg by mouth daily.    Historical Provider, MD  metoprolol (TOPROL-XL) 200 MG 24 hr tablet Take 200 mg by mouth daily.    Historical Provider, MD  Multiple Vitamin (MULTIVITAMIN WITH MINERALS) TABS tablet Take 1 tablet by mouth at bedtime.     Historical Provider, MD  potassium chloride SA (K-DUR,KLOR-CON) 20 MEQ tablet Take 20 mEq by mouth daily.    Historical Provider, MD  QUEtiapine (SEROQUEL XR) 400 MG 24 hr tablet Take 400 mg by mouth at bedtime.    Historical Provider, MD  tamsulosin (FLOMAX) 0.4 MG CAPS capsule Take 0.4 mg by mouth daily.    Historical Provider, MD   Physical Exam: Blood pressure 140/93, pulse 91, temperature 98 F (36.7 C), temperature source Oral, resp. rate 20, height 6' (1.829 m), weight 127.914 kg (282 lb), SpO2 99.00%. Filed Vitals:   06/02/14 0551  BP: 140/93  Pulse: 91  Temp: 98 F (36.7 C)  Resp: 20     General:  Well-developed well-nourished in no acute cardiopulmonary distress. Speaking in full sentences.  Eyes: Pupils equal round and reactive to light and accommodation. Extraocular movements intact.  ENT: Oropharynx is clear, no lesions, no exudates. Poor dentition.  Neck: Supple with no lymphadenopathy.  Cardiovascular: Regular rate rhythm no  murmurs rubs or gallops.  Respiratory: Clear to auscultation bilaterally. Good air movement. No wheezing, no crackles, no rhonchi.  Abdomen: Obese, soft, nontender, nondistended, positive bowel sounds.  Skin: Chronic venous stasis changes noted on bilateral lower extremities left greater than right.  Musculoskeletal: 5/5 bilateral upper extremity strength. 5/5 bilateral lower extremity strength. No clubbing no cyanosis. Trace bilateral lower extremity edema.  Psychiatric: Labile affect. Tangential, pressured speech.  Impulsive.  Neurologic: Alert and oriented x3. CN II-XII grossly intact. No focal deficits.  Labs on Admission:  Basic Metabolic Panel:  Recent Labs Lab 06/01/14 0652  NA 136*  K 3.9  CL 96  CO2 29  GLUCOSE 180*  BUN 23  CREATININE 1.44*  CALCIUM 9.6   Liver Function Tests:  Recent Labs Lab 06/01/14 0652  AST 34  ALT 49  ALKPHOS 95  BILITOT 0.6  PROT 7.6  ALBUMIN 3.6   No results found for this basename: LIPASE, AMYLASE,  in the last 168 hours No results found for this basename: AMMONIA,  in the last 168 hours CBC: No results found for this basename: WBC, NEUTROABS, HGB, HCT, MCV, PLT,  in the last 168 hours Cardiac Enzymes: No results found for this basename: CKTOTAL, CKMB, CKMBINDEX, TROPONINI,  in the last 168 hours BNP: No components found with this basename: POCBNP,  CBG:  Recent Labs Lab 06/01/14 1154 06/01/14 1646 06/01/14 2100 06/02/14 0636 06/02/14 1214  GLUCAP 250* 177* 167* 154* 201*    Radiological Exams on Admission: No results found.  EKG: Independently reviewed. NSR, with q waves and some j point elevation, unchanged from prior.  Time spent: 8265 MINS  Anamosa Community HospitalHOMPSON,Bridger Pizzi MD [facility] Pager [phone]  If 7PM-7AM, please contact night-coverage [email or web address] Password [email or web address] 06/02/2014, 2:18 PM

## 2014-06-02 NOTE — Progress Notes (Addendum)
Patient ID: Christian [name], male   DOB: 1951/07/01, 63 y.o.   MRN: [id number] D. Patient presents with hypomanic mood,affect congruent. Christian [name] continues to present with disorganized thought process, and he remains grandiose but show improvement in his speech and irritability today. He is drowsy this morning. He continues to be grandiose stating he '' has to get all of his money, to hook different people up. I'm going to set everyone right here in the building.'' .Christian [name] continues to write various notes to staff. A. Medications given as ordered. Noted elevated B/P again today and discussed above information with N. Mashburn PAC. R. Patient has been visible on the unit, he has been resting in bed intermittently throughout the shift thus far. Christian [name] continues to be educated on diabetic diet, as noted uncontrolled glucose, but he reports he is non compliant. '' I can't do without the sugar, I like my grits in sugar. And ice cream '' No further voiced concerns at this time. Will continue to monitor.

## 2014-06-03 DIAGNOSIS — IMO0002 Reserved for concepts with insufficient information to code with codable children: Secondary | ICD-10-CM

## 2014-06-03 DIAGNOSIS — E118 Type 2 diabetes mellitus with unspecified complications: Secondary | ICD-10-CM

## 2014-06-03 DIAGNOSIS — E1165 Type 2 diabetes mellitus with hyperglycemia: Secondary | ICD-10-CM

## 2014-06-03 DIAGNOSIS — I10 Essential (primary) hypertension: Secondary | ICD-10-CM

## 2014-06-03 LAB — GLUCOSE, CAPILLARY
Glucose-Capillary: 200 mg/dL — ABNORMAL HIGH (ref 70–99)
Glucose-Capillary: 174 mg/dL — ABNORMAL HIGH (ref 70–99)
Glucose-Capillary: 200 mg/dL — ABNORMAL HIGH (ref 70–99)
Glucose-Capillary: 142 mg/dL — ABNORMAL HIGH (ref 70–99)

## 2014-06-03 MED ORDER — INSULIN GLARGINE 100 UNIT/ML ~~LOC~~ SOLN: 10.0000 [IU] | Freq: Every day | SUBCUTANEOUS | Status: DC

## 2014-06-03 MED ORDER — INSULIN GLARGINE 100 UNIT/ML ~~LOC~~ SOLN
15.0000 [IU] | Freq: Every day | SUBCUTANEOUS | Status: DC
  Administered 2014-06-03 – 2014-06-04 (×2): 15 [IU] via SUBCUTANEOUS

## 2014-06-03 MED ORDER — LORAZEPAM 0.5 MG PO TABS
0.5000 mg | ORAL_TABLET | Freq: Three times a day (TID) | ORAL | Status: DC
  Administered 2014-06-03 – 2014-06-05 (×6): 0.5 mg via ORAL
  Filled 2014-06-03 (×6): qty 1

## 2014-06-03 NOTE — Progress Notes (Signed)
NSG shift assessment. 7a-7p.   D: Affect blunted, behavior intrusive. Reports delusion of believing that someone has taken his singing voice away from him.  Attended groups and participated.  Taking medications without asking what they are, or refusing them, as he has done previously. Would like to go home.  In Self Inventory, pt rated his depression as a 1/10 and feelings of hopelessness as 1/10. He admits to feeling some agitation. When he returns home he plans to, "Follow all medication and doctor's orders."   Cooperative with staff and is getting along well with peers.   A: Observed pt interacting in group and in the milieu: Support and encouragement offered. Safety maintained with observations every 15 minutes.   R:   Contracts for safety and continues to follow the treatment plan, working on learning new coping skills.   2:54 PM Became upset that he is not going home today and was tearful. He was also upset that Dr. Mervyn Skeeters is not here today, and wants to see someone in upper management to complain. Informed this Clinical research associate that he is going to sue [facility] because they mistreated him when he was held there prior to coming here.

## 2014-06-03 NOTE — Progress Notes (Signed)
Patient ID: Christian [name], male   DOB: Jun 24, 1951, 62 y.o.   MRN: [id number]  [facility] MD Progress Note  06/03/2014 2:37 PM Christian [name]  MRN:  [id number]  Subjective:  Patient states "I was hoping to go today. I can't be held against my will. I would like to speak to the person in charge of here. I am doing fine. I want to go."   Objective:  Patient is seen and chart is reviewed. The patient continues to experience some manic symptoms although improved. He is again fixated on leaving today. Patient becomes very tearful during the assessment today with Probation officer. He provide handwritten notes that describe how much better he is doing. Christian [name] becomes agitated when it is mentioned that his manic symptoms are still present. Patient has been making slow but steady progress in his treatment. However, the patient has been asking daily to be discharged. The patient is noted to have become less agitated and aggressive as his hospital stay progressed. The social worker will speak to his wife today to obtain more information.   Diagnosis:   DSM5: Bipolar disorder  Total Time spent with patient: 20 minutes  Axis I: Bipolar 1 disorder -current episode manic  Axis II: Deferred Axis III:  Past Medical History  Diagnosis Date  . Gout   . Hypertension    Axis IV: other psychosocial or environmental problems and problems related to social environment Axis V: 41-50 serious symptoms  ADL's: Intact  Sleep: Fair  Appetite: Fair  Suicidal Ideation:  Denies Homicidal Ideation:  Denies AEB (as evidenced by):  Psychiatric Specialty Exam: Physical Exam  Psychiatric: His affect is labile. His speech is rapid and/or pressured and tangential. He is agitated. Thought content is delusional. Cognition and memory are normal. He expresses impulsivity.    Review of Systems  Constitutional: Negative.   HENT: Negative.   Eyes: Negative.   Respiratory: Negative.   Cardiovascular: Negative.    Gastrointestinal: Negative.   Genitourinary: Negative.   Musculoskeletal: Negative.   Skin: Negative.   Neurological: Negative.   Endo/Heme/Allergies: Negative.   Psychiatric/Behavioral: Positive for depression. The patient is nervous/anxious and has insomnia.     Blood pressure 160/100, pulse 89, temperature 97.9 F (36.6 C), temperature source Oral, resp. rate 18, height 6' (1.829 m), weight 127.914 kg (282 lb), SpO2 99.00%.Body mass index is 38.24 kg/(m^2).   General Appearance: Fairly Groomed   Engineer, water:: Fair   Speech: Pressured   Volume: fluctuates,increased at times   Mood: Anxious and Irritable   Affect: Labile   Thought Process: Circumstantial and Tangential   Orientation: Full (Time, Place, and Person)   Thought Content: Rumination  Suicidal Thoughts: No   Homicidal Thoughts: No   Memory: Immediate; Fair  Recent; Fair  Remote; Fair   Judgement: Impaired   Insight: Shallow  Psychomotor Activity: Increased and Restlessness   Concentration: Poor   Recall: Christian [name]   Language: Fair   Akathisia: No   Handed: Right  AIMS (if indicated):   Assets: Housing  Social Support   Sleep: Number of Hours: 4   Musculoskeletal:  Strength & Muscle Tone: within normal limits  Gait & Station: normal  Patient leans: N/A  Current Medications: Current Facility-Administered Medications  Medication Dose Route Frequency Provider Last Rate Last Dose  . acetaminophen (TYLENOL) tablet 650 mg  650 mg Oral Q6H PRN Lurena Nida, NP      . allopurinol (ZYLOPRIM) tablet 300 mg  300  mg Oral Daily Lurena Nida, NP   300 mg at 06/03/14 0809  . alum & mag hydroxide-simeth (MAALOX/MYLANTA) 200-200-20 MG/5ML suspension 30 mL  30 mL Oral Q4H PRN Lurena Nida, NP      . ARIPiprazole (ABILIFY) tablet 15 mg  15 mg Oral Daily Nena Polio, PA-C   15 mg at 06/03/14 0809  . atorvastatin (LIPITOR) tablet 10 mg  10 mg Oral q1800 Lurena Nida, NP   10 mg at 06/02/14 1631  .  colchicine tablet 0.6 mg  0.6 mg Oral Daily Lurena Nida, NP   0.6 mg at 06/03/14 0809  . divalproex (DEPAKOTE ER) 24 hr tablet 1,500 mg  1,500 mg Oral QHS Nena Polio, PA-C   1,500 mg at 06/02/14 2246  . divalproex (DEPAKOTE) DR tablet 500 mg  500 mg Oral Loree Fee, PA-C   500 mg at 06/03/14 3734  . furosemide (LASIX) tablet 80 mg  80 mg Oral QAC breakfast Lurena Nida, NP   80 mg at 06/03/14 2876   And  . furosemide (LASIX) tablet 40 mg  40 mg Oral QPC supper Lurena Nida, NP   40 mg at 06/02/14 1631  . glipiZIDE (GLUCOTROL XL) 24 hr tablet 10 mg  10 mg Oral BID WC Lurena Nida, NP   10 mg at 06/03/14 0809  . guaiFENesin (MUCINEX) 12 hr tablet 600 mg  600 mg Oral BID PRN Elmarie Shiley, NP   600 mg at 06/03/14 0814  . hydrALAZINE (APRESOLINE) tablet 50 mg  50 mg Oral 3 times per day [name]   50 mg at 06/03/14 0639  . ibuprofen (ADVIL,MOTRIN) tablet 600 mg  600 mg Oral Q8H PRN Lurena Nida, NP   600 mg at 05/26/14 2213  . insulin aspart (novoLOG) injection 0-9 Units  0-9 Units Subcutaneous TID WC Benjamine Mola, FNP   2 Units at 06/03/14 1200  . insulin aspart (novoLOG) injection 3 Units  3 Units Subcutaneous TID WC Eugenie Filler, MD   3 Units at 06/03/14 1200  . insulin glargine (LANTUS) injection 10 Units  10 Units Subcutaneous QHS Erline Hau, MD      . linagliptin (TRADJENTA) tablet 5 mg  5 mg Oral Daily Lurena Nida, NP   5 mg at 06/03/14 0814  . LORazepam (ATIVAN) tablet 1 mg  1 mg Oral TID [name]   1 mg at 06/03/14 0809  . losartan (COZAAR) tablet 100 mg  100 mg Oral Daily Lurena Nida, NP   100 mg at 06/03/14 0809  . magnesium hydroxide (MILK OF MAGNESIA) suspension 30 mL  30 mL Oral Daily PRN Lurena Nida, NP   30 mL at 05/27/14 2033  . menthol-cetylpyridinium (CEPACOL) lozenge 3 mg  1 lozenge Oral PRN Elmarie Shiley, NP   3 mg at 05/30/14 1500  . metoprolol succinate (TOPROL-XL) 24 hr tablet 200 mg  200 mg Oral Daily Lurena Nida, NP    200 mg at 06/03/14 8115  . ondansetron (ZOFRAN) tablet 4 mg  4 mg Oral Q8H PRN Lurena Nida, NP      . traZODone (DESYREL) tablet 200 mg  200 mg Oral QHS Nena Polio, PA-C   200 mg at 06/02/14 2246    Lab Results:  Results for orders placed during the hospital encounter of 05/25/14 (from the past 48 hour(s))  GLUCOSE, CAPILLARY     Status: Abnormal   Collection Time  06/01/14  4:46 PM      Result Value Ref Range   Glucose-Capillary 177 (*) 70 - 99 mg/dL  GLUCOSE, CAPILLARY     Status: Abnormal   Collection Time    06/01/14  9:00 PM      Result Value Ref Range   Glucose-Capillary 167 (*) 70 - 99 mg/dL  GLUCOSE, CAPILLARY     Status: Abnormal   Collection Time    06/02/14  6:36 AM      Result Value Ref Range   Glucose-Capillary 154 (*) 70 - 99 mg/dL  GLUCOSE, CAPILLARY     Status: Abnormal   Collection Time    06/02/14 12:14 PM      Result Value Ref Range   Glucose-Capillary 201 (*) 70 - 99 mg/dL  GLUCOSE, CAPILLARY     Status: Abnormal   Collection Time    06/02/14  4:57 PM      Result Value Ref Range   Glucose-Capillary 150 (*) 70 - 99 mg/dL  MAGNESIUM     Status: None   Collection Time    06/02/14  7:35 PM      Result Value Ref Range   Magnesium 1.7  1.5 - 2.5 mg/dL   Comment: Performed at [place] PANEL     Status: Abnormal   Collection Time    06/02/14  7:35 PM      Result Value Ref Range   Sodium 134 (*) 137 - 147 mEq/L   Potassium 4.1  3.7 - 5.3 mEq/L   Chloride 92 (*) 96 - 112 mEq/L   CO2 32  19 - 32 mEq/L   Glucose, Bld 130 (*) 70 - 99 mg/dL   BUN 26 (*) 6 - 23 mg/dL   Creatinine, Ser 1.59 (*) 0.50 - 1.35 mg/dL   Calcium 9.5  8.4 - 10.5 mg/dL   GFR calc non Af Amer 46 (*) >90 mL/min   GFR calc Af Amer 53 (*) >90 mL/min   Comment: (NOTE)     The eGFR has been calculated using the CKD EPI equation.     This calculation has not been validated in all clinical situations.     eGFR's persistently <90 mL/min signify  possible Chronic Kidney     Disease.   Anion gap 10  5 - 15   Comment: Performed at [place]     Status: None   Collection Time    06/02/14  7:35 PM      Result Value Ref Range   Albumin 3.5  3.5 - 5.2 g/dL   Comment: Performed at [place], CAPILLARY     Status: Abnormal   Collection Time    06/02/14  9:07 PM      Result Value Ref Range   Glucose-Capillary 127 (*) 70 - 99 mg/dL  GLUCOSE, CAPILLARY     Status: Abnormal   Collection Time    06/03/14  6:18 AM      Result Value Ref Range   Glucose-Capillary 200 (*) 70 - 99 mg/dL   Comment 1 Notify RN    GLUCOSE, CAPILLARY     Status: Abnormal   Collection Time    06/03/14 11:53 AM      Result Value Ref Range   Glucose-Capillary 174 (*) 70 - 99 mg/dL   Comment 1 Documented in Chart     Comment 2 Notify RN      Physical  Findings: AIMS: Facial and Oral Movements Muscles of Facial Expression: None, normal Lips and Perioral Area: None, normal Jaw: None, normal Tongue: None, normal,Extremity Movements Upper (arms, wrists, hands, fingers): None, normal Lower (legs, knees, ankles, toes): None, normal, Trunk Movements Neck, shoulders, hips: None, normal, Overall Severity Severity of abnormal movements (highest score from questions above): None, normal Incapacitation due to abnormal movements: None, normal Patient's awareness of abnormal movements (rate only patient's report): No Awareness, Dental Status Current problems with teeth and/or dentures?: No Does patient usually wear dentures?: No  CIWA:  CIWA-Ar Total: 0 COWS:     Treatment Plan Summary: Daily contact with patient to assess and evaluate symptoms and progress in treatment Medication management  Plan:  Review of chart, vital signs, medications, and notes.  1-Individual and group therapy  2-Medication management: -Continue Abilify 15 mg daily for mood control.  -Continue  Depakote ER 1500 mg hs for mood  stabilization -Decrease Ativan to 0.5 mg TID for anxiety 3-Coping skills for mood lability, agitation  4-Continue crisis stabilization and management  5-Address health issues-Continue Apresoline  50 mg TID, Cozaar 100 mg daily, and Toprol XL 200 mg daily for elevated blood pressure. Increase Lantus to 15 units hs per Diabetic Recommendations for elevated blood sugars.   6-Treatment plan in progress to prevent relapse of Bipolar symptoms 7- Obtain Depakote level is 62.4   Medical Decision Making Problem Points:  Established problem, slight improvement (1) ,Data Points:  Review or order clinical lab tests (1) Review of medication regiment & side effects (2) Review of new medications or change in dosage (2)  I certify that inpatient services furnished can reasonably be expected to improve the patient's condition.  Elmarie Shiley NP-C 2:37 PM 06/03/2014

## 2014-06-03 NOTE — Progress Notes (Signed)
Inpatient Diabetes Program Recommendations  AACE/ADA: New Consensus Statement on Inpatient Glycemic Control (2013)  Target Ranges:  Prepandial:   less than 140 mg/dL      Peak postprandial:   less than 180 mg/dL (1-2 hours)      Critically ill patients:  140 - 180 mg/dL   Reason for Visit: Diabetes Consult Diabetes history: DM2 Outpatient Diabetes medications: Lantus 55 units QHS, tradjenta 5 mg QD, glipizide 10 mg bid,  Current orders for Inpatient glycemic control: Novolog 3 units tidwc, Novolog sensitive tidwc, glipizide 10 mg bid, tradjenta 5 mg QD  Inpatient Diabetes Program Recommendations Insulin - Basal: Addd Lantus 15 units QHS May need increase in meal coverage insulin if CBG runs >200 mg/dL.  Note: Will follow daily. Thank you. Ailene Ards, RD, LDN, CDE Inpatient Diabetes Coordinator [phone]

## 2014-06-03 NOTE — Progress Notes (Signed)
Pt reports he is doing much better and feels he is ready to be discharged.  He did not go to group this evening, but stayed in his room.  He still seems somewhat manic, but not to the degree that he was when he was admitted.  He says his medications are working and he plans to continue taking them when he goes home.  Pt denies SI/HI/AV.  He is cooperative/pleasant with staff and peers.  Pt makes his needs known to staff, and he is redirectable.  Support and encouragement offered.  Safety maintained with q15 minute checks.

## 2014-06-03 NOTE — Progress Notes (Signed)
Child/Adolescent Psychoeducational Group Note  Date:  06/03/2014 Time:  9:46 PM  Group Topic/Focus:  Wrap-Up Group:   The focus of this group is to help patients review their daily goal of treatment and discuss progress on daily workbooks.  Participation Level:  Did Not Attend  Participation Quality:  Did Not Attend  Affect:  Did Not Attend  Cognitive:  Did Not Attend  Insight:  Did Not Attend  Engagement in Group:  Did Not Attend  Modes of Intervention:  Did Not Attend  Additional Comments:  Pt did not attend group due to his refusal.  Sheran Lawless 06/03/2014, 9:46 PM

## 2014-06-03 NOTE — [facility] Group Notes (Signed)
[facility] LCSW Group Therapy  06/03/2014 1:15 pm  Type of Therapy: Process Group Therapy  Participation Level:  Active  Participation Quality:  Appropriate  Affect:  Flat  Cognitive:  Oriented  Insight:  Improving  Engagement in Group:  Limited  Engagement in Therapy:  Limited  Modes of Intervention:  Activity, Clarification, Education, Problem-solving and Support  Summary of Progress/Problems: Today's group addressed the issue of overcoming obstacles.  Patients were asked to identify their biggest obstacle post d/c that stands in the way of their on-going success, and then problem solve as to how to manage this.  Did not attend.  [name] 06/03/2014   3:01 PM

## 2014-06-03 NOTE — Progress Notes (Signed)
     Medicine consulted 7/5 for management of chronic medical issues and advice on abnormal EKG. Please see consult note by Dr. Janee Morn for further details. EKG shows no significant change from prior. Agree with DC of Geodon given prolonged QTc. Electrolytes are WNL and do not require repletion. In regards to his DM, CBGs have been elevated, would recommend starting Lantus 10 units daily and will further adjust according to CBGs. Hydralazine was recently added for his HTN and as such would not add any further meds for at least another 5 days to document effect of hydralazine. Will continue to follow from a distance and adjust medications as needed.  Peggye Pitt, MD [facility] Pager: [phone]

## 2014-06-04 LAB — GLUCOSE, CAPILLARY
Glucose-Capillary: 136 mg/dL — ABNORMAL HIGH (ref 70–99)
Glucose-Capillary: 134 mg/dL — ABNORMAL HIGH (ref 70–99)
Glucose-Capillary: 186 mg/dL — ABNORMAL HIGH (ref 70–99)
Glucose-Capillary: 144 mg/dL — ABNORMAL HIGH (ref 70–99)

## 2014-06-04 MED ORDER — DIVALPROEX SODIUM ER 500 MG PO TB24
1000.0000 mg | ORAL_TABLET | Freq: Two times a day (BID) | ORAL | Status: DC
  Administered 2014-06-04 – 2014-06-05 (×2): 1000 mg via ORAL
  Filled 2014-06-04 (×2): qty 56
  Filled 2014-06-04 (×4): qty 2

## 2014-06-04 NOTE — Progress Notes (Signed)
    CBGs much improved after initiation of lantus. BP well controlled on current regimen. Would not change medications for DM and HTN at this point. Will sign off. Please call us back with questions.  Peggye Pitt, MD [facility] Pager: [phone]

## 2014-06-04 NOTE — [facility] Group Notes (Signed)
Adult Psychoeducational Group Note  Date:  06/04/2014 Time:  9:56 PM  Group Topic/Focus:  Wrap-Up Group:   The focus of this group is to help patients review their daily goal of treatment and discuss progress on daily workbooks.  Participation Level:  Did Not Attend  Participation Quality:  None  Affect:  None  Cognitive:  None  Insight: None  Engagement in Group:  None  Modes of Intervention:  Discussion  Additional Comments:  Kayhan did not attend group.  Caroll Rancher A 06/04/2014, 9:56 PM

## 2014-06-04 NOTE — [facility] Group Notes (Signed)
[facility] Group Notes:  (Nursing/MHT/Case Management/Adjunct)  Date:  06/04/2014  Time:  0900  Type of Therapy:  Psychoeducational Skills  Participation Level:  Minimal  Participation Quality:  Attentive  Affect:  Anxious  Cognitive:  Appropriate  Insight:  Lacking  Engagement in Group:  Supportive  Modes of Intervention:  Clarification  Summary of Progress/Problems:Nursing wellness/ Tuesday recovery   Christian [name] 06/04/2014, 4:27 PM

## 2014-06-04 NOTE — Progress Notes (Signed)
Pt reports he is doing ok, but is frustrated that he did not get to discharge today.  "I have things I need to do at home."  Pt has been pleasant/cooperative with staff and peers, although when he jokes with peers, it can be taken in the wrong way.  Pt said something in a joking way to a male peer earlier, and she became angry and started cussing at him.  He then became angry, but writer was able to help pt to calm down and return to his room without incident.  Pt denies SI/HI/AV.  Pt makes his needs known to staff.  Support and encouragement offered.  Safety maintained with q15 minute checks.

## 2014-06-04 NOTE — [facility] Group Notes (Signed)
[facility] LCSW Group Therapy  06/04/2014 , 1:27 PM   Type of Therapy:  Group Therapy  Participation Level:  Active  Participation Quality:  Attentive  Affect:  Appropriate  Cognitive:  Alert  Insight:  Improving  Engagement in Therapy:  Engaged  Modes of Intervention:  Discussion, Exploration and Socialization  Summary of Progress/Problems: Today's group focused on the term Diagnosis.  Participants were asked to define the term, and then pronounce whether it is a negative, positive or neutral term.  For the most part, Christian [name] dozed during group.  At one point, he held up his hand to say something, and when I did not see it, he stated that he felt he was being disrespected in an angry way.  However, he did not take it any further.  Christian [name] B 06/04/2014 , 1:27 PM

## 2014-06-04 NOTE — Tx Team (Signed)
  Interdisciplinary Treatment Plan Update   Date Reviewed:  06/04/2014  Time Reviewed:  8:16 AM  Progress in Treatment:   Attending groups: Yes Participating in groups: Yes Taking medication as prescribed: Yes  Tolerating medication: Yes Family/Significant other contact made: Yes  Patient understands diagnosis: Yes  Discussing patient identified problems/goals with staff: Yes Medical problems stabilized or resolved: Yes Denies suicidal/homicidal ideation: Yes Patient has not harmed self or others: Yes  For review of initial/current patient goals, please see plan of care.  Estimated Length of Stay:  D/C tomorrow  Reason for Continuation of Hospitalization:   New Problems/Goals identified:  N/A  Discharge Plan or Barriers:   return home, follow up outpt  Additional Comments:  Patient states "I was hoping to go today. I can't be held against my will. I would like to speak to the person in charge of here. I am doing fine. I want to go."  The patient continues to experience some manic symptoms although improved. He is again fixated on leaving today. Patient becomes very tearful during the assessment today with Clinical research associate. He provide handwritten notes that describe how much better he is doing.   Attendees:  Signature: Thedore Mins, MD 06/04/2014 8:16 AM   Signature: Richelle Ito, LCSW 06/04/2014 8:16 AM  Signature: [name], NP 06/04/2014 8:16 AM  Signature: Joslyn Devon, RN 06/04/2014 8:16 AM  Signature: Liborio Nixon, RN 06/04/2014 8:16 AM  Signature:  06/04/2014 8:16 AM  Signature:   06/04/2014 8:16 AM  Signature:    Signature:    Signature:    Signature:    Signature:    Signature:      Scribe for Treatment Team:   [employer], LCSW  06/04/2014 8:16 AM

## 2014-06-04 NOTE — Progress Notes (Signed)
Patient ID: Christian [name], male   DOB: [birth year], 63 y.o.   MRN: [id number]  [facility] MD Progress Note  06/04/2014 10:57 AM Christian [name]  MRN:  [id number]  Subjective:  Patient states "I told my wife to pick me up today, I am ready to go home, I have to take care of my business.'' Objective:  Patient is seen and chart is reviewed. Patient is demanding to be discharged home today, he continues to show grandiose delusions, manic behavior and poor judgment as evidenced by promising to employed one of his peers to cut his yard and promising to buy stuffs for every staff he comes in contact with. However, patient has been showing decreased agitation, mood lability and racing thoughts. He endorsed sleeping much better, compliance with his medications and denies any adverse reactions. Diagnosis:   DSM5: Bipolar disorder  Total Time spent with patient: 20 minutes  Axis I: Bipolar 1 disorder -current episode manic  Axis II: Deferred Axis III:  Past Medical History  Diagnosis Date  . Gout   . Hypertension    Axis IV: other psychosocial or environmental problems and problems related to social environment Axis V: 50-60 moderate symptoms  ADL's: Intact  Sleep: Fair  Appetite: Fair  Suicidal Ideation:  Denies Homicidal Ideation:  Denies AEB (as evidenced by):  Psychiatric Specialty Exam: Physical Exam  Psychiatric: His affect is labile. His speech is rapid and/or pressured and tangential. He is agitated. Thought content is delusional. Cognition and memory are normal. He expresses impulsivity.    Review of Systems  Constitutional: Negative.   HENT: Negative.   Eyes: Negative.   Respiratory: Negative.   Cardiovascular: Negative.   Gastrointestinal: Negative.   Genitourinary: Negative.   Musculoskeletal: Negative.   Skin: Negative.   Neurological: Negative.   Endo/Heme/Allergies: Negative.   Psychiatric/Behavioral: Positive for depression. The patient is nervous/anxious and has  insomnia.     Blood pressure 130/85, pulse 81, temperature 98.3 F (36.8 C), temperature source Oral, resp. rate 18, height 6' (1.829 m), weight 127.914 kg (282 lb), SpO2 99.00%.Body mass index is 38.24 kg/(m^2).   General Appearance: Fairly Groomed   Engineer, water:: Fair   Speech: Pressured   Volume: fluctuates,increased at times   Mood: Anxious and Irritable   Affect: Labile   Thought Process: Circumstantial and Tangential   Orientation: Full (Time, Place, and Person)   Thought Content: Rumination  Suicidal Thoughts: No   Homicidal Thoughts: No   Memory: Immediate; Fair  Recent; Fair  Remote; Fair   Judgement: Impaired   Insight: Shallow  Psychomotor Activity: Increased and Restlessness   Concentration: fair  Recall: [place]   Language: Fair   Akathisia: No   Handed: Right  AIMS (if indicated):   Assets: Housing  Social Support   Sleep: Number of Hours: 4   Musculoskeletal:  Strength & Muscle Tone: within normal limits  Gait & Station: normal  Patient leans: N/A  Current Medications: Current Facility-Administered Medications  Medication Dose Route Frequency Provider Last Rate Last Dose  . acetaminophen (TYLENOL) tablet 650 mg  650 mg Oral Q6H PRN Lurena Nida, NP   650 mg at 06/04/14 0809  . allopurinol (ZYLOPRIM) tablet 300 mg  300 mg Oral Daily Lurena Nida, NP   300 mg at 06/04/14 0809  . alum & mag hydroxide-simeth (MAALOX/MYLANTA) 200-200-20 MG/5ML suspension 30 mL  30 mL Oral Q4H PRN Lurena Nida, NP   30 mL at 06/04/14 0232  .  ARIPiprazole (ABILIFY) tablet 15 mg  15 mg Oral Daily Nena Polio, PA-C   15 mg at 06/04/14 0809  . atorvastatin (LIPITOR) tablet 10 mg  10 mg Oral q1800 Lurena Nida, NP   10 mg at 06/03/14 1819  . colchicine tablet 0.6 mg  0.6 mg Oral Daily Lurena Nida, NP   0.6 mg at 06/04/14 0809  . divalproex (DEPAKOTE ER) 24 hr tablet 1,000 mg  1,000 mg Oral BID PC Darria Corvera      . furosemide (LASIX) tablet 80 mg  80  mg Oral QAC breakfast Lurena Nida, NP   80 mg at 06/04/14 6237   And  . furosemide (LASIX) tablet 40 mg  40 mg Oral QPC supper Lurena Nida, NP   40 mg at 06/03/14 1819  . glipiZIDE (GLUCOTROL XL) 24 hr tablet 10 mg  10 mg Oral BID WC Lurena Nida, NP   10 mg at 06/04/14 0809  . guaiFENesin (MUCINEX) 12 hr tablet 600 mg  600 mg Oral BID PRN Elmarie Shiley, NP   600 mg at 06/03/14 0814  . hydrALAZINE (APRESOLINE) tablet 50 mg  50 mg Oral 3 times per day Keyna Blizard   50 mg at 06/04/14 6283  . ibuprofen (ADVIL,MOTRIN) tablet 600 mg  600 mg Oral Q8H PRN Lurena Nida, NP   600 mg at 05/26/14 2213  . insulin aspart (novoLOG) injection 0-9 Units  0-9 Units Subcutaneous TID WC Benjamine Mola, FNP   1 Units at 06/04/14 [phone]  . insulin aspart (novoLOG) injection 3 Units  3 Units Subcutaneous TID WC Eugenie Filler, MD   3 Units at 06/04/14 0636  . insulin glargine (LANTUS) injection 15 Units  15 Units Subcutaneous QHS Elmarie Shiley, NP   15 Units at 06/03/14 2119  . linagliptin (TRADJENTA) tablet 5 mg  5 mg Oral Daily Lurena Nida, NP   5 mg at 06/04/14 0809  . LORazepam (ATIVAN) tablet 0.5 mg  0.5 mg Oral TID Elmarie Shiley, NP   0.5 mg at 06/04/14 0809  . losartan (COZAAR) tablet 100 mg  100 mg Oral Daily Lurena Nida, NP   100 mg at 06/04/14 0809  . magnesium hydroxide (MILK OF MAGNESIA) suspension 30 mL  30 mL Oral Daily PRN Lurena Nida, NP   30 mL at 05/27/14 2033  . menthol-cetylpyridinium (CEPACOL) lozenge 3 mg  1 lozenge Oral PRN Elmarie Shiley, NP   3 mg at 05/30/14 1500  . metoprolol succinate (TOPROL-XL) 24 hr tablet 200 mg  200 mg Oral Daily Lurena Nida, NP   200 mg at 06/04/14 0809  . ondansetron (ZOFRAN) tablet 4 mg  4 mg Oral Q8H PRN Lurena Nida, NP      . traZODone (DESYREL) tablet 200 mg  200 mg Oral QHS Nena Polio, PA-C   200 mg at 06/03/14 2116    Lab Results:  Results for orders placed during the hospital encounter of 05/25/14 (from the past 48 hour(s))  GLUCOSE, CAPILLARY      Status: Abnormal   Collection Time    06/02/14 12:14 PM      Result Value Ref Range   Glucose-Capillary 201 (*) 70 - 99 mg/dL  GLUCOSE, CAPILLARY     Status: Abnormal   Collection Time    06/02/14  4:57 PM      Result Value Ref Range   Glucose-Capillary 150 (*) 70 - 99 mg/dL  MAGNESIUM  Status: None   Collection Time    06/02/14  7:35 PM      Result Value Ref Range   Magnesium 1.7  1.5 - 2.5 mg/dL   Comment: Performed at [place] PANEL     Status: Abnormal   Collection Time    06/02/14  7:35 PM      Result Value Ref Range   Sodium 134 (*) 137 - 147 mEq/L   Potassium 4.1  3.7 - 5.3 mEq/L   Chloride 92 (*) 96 - 112 mEq/L   CO2 32  19 - 32 mEq/L   Glucose, Bld 130 (*) 70 - 99 mg/dL   BUN 26 (*) 6 - 23 mg/dL   Creatinine, Ser 1.59 (*) 0.50 - 1.35 mg/dL   Calcium 9.5  8.4 - 10.5 mg/dL   GFR calc non Af Amer 46 (*) >90 mL/min   GFR calc Af Amer 53 (*) >90 mL/min   Comment: (NOTE)     The eGFR has been calculated using the CKD EPI equation.     This calculation has not been validated in all clinical situations.     eGFR's persistently <90 mL/min signify possible Chronic Kidney     Disease.   Anion gap 10  5 - 15   Comment: Performed at [place]     Status: None   Collection Time    06/02/14  7:35 PM      Result Value Ref Range   Albumin 3.5  3.5 - 5.2 g/dL   Comment: Performed at [place], CAPILLARY     Status: Abnormal   Collection Time    06/02/14  9:07 PM      Result Value Ref Range   Glucose-Capillary 127 (*) 70 - 99 mg/dL  GLUCOSE, CAPILLARY     Status: Abnormal   Collection Time    06/03/14  6:18 AM      Result Value Ref Range   Glucose-Capillary 200 (*) 70 - 99 mg/dL   Comment 1 Notify RN    GLUCOSE, CAPILLARY     Status: Abnormal   Collection Time    06/03/14 11:53 AM      Result Value Ref Range   Glucose-Capillary 174 (*) 70 - 99 mg/dL   Comment 1  Documented in Chart     Comment 2 Notify RN    GLUCOSE, CAPILLARY     Status: Abnormal   Collection Time    06/03/14  4:33 PM      Result Value Ref Range   Glucose-Capillary 200 (*) 70 - 99 mg/dL   Comment 1 Documented in Chart     Comment 2 Notify RN    GLUCOSE, CAPILLARY     Status: Abnormal   Collection Time    06/03/14  8:28 PM      Result Value Ref Range   Glucose-Capillary 142 (*) 70 - 99 mg/dL  GLUCOSE, CAPILLARY     Status: Abnormal   Collection Time    06/04/14  6:23 AM      Result Value Ref Range   Glucose-Capillary 136 (*) 70 - 99 mg/dL    Physical Findings: AIMS: Facial and Oral Movements Muscles of Facial Expression: None, normal Lips and Perioral Area: None, normal Jaw: None, normal Tongue: None, normal,Extremity Movements Upper (arms, wrists, hands, fingers): None, normal Lower (legs, knees, ankles, toes): None, normal, Trunk Movements Neck, shoulders, hips: None, normal, Overall Severity  Severity of abnormal movements (highest score from questions above): None, normal Incapacitation due to abnormal movements: None, normal Patient's awareness of abnormal movements (rate only patient's report): No Awareness, Dental Status Current problems with teeth and/or dentures?: No Does patient usually wear dentures?: No  CIWA:  CIWA-Ar Total: 0 COWS:     Treatment Plan Summary: Daily contact with patient to assess and evaluate symptoms and progress in treatment Medication management  Plan:  Review of chart, vital signs, medications, and notes.  1-Individual and group therapy  2-Medication management: -Continue Abilify 15 mg daily for mood control.  -Change  Depakote ER to 174m bid for mood stabilization -Continue  Ativan to 0.5 mg TID for anxiety 3-Coping skills for mood lability, agitation  4-Continue crisis stabilization and management  5-Address health issues-Continue Apresoline  50 mg TID, Cozaar 100 mg daily, and Toprol XL 200 mg daily for elevated blood  pressure. Increase Lantus to 15 units hs per Diabetic Recommendations for elevated blood sugars.   6-Treatment plan in progress to prevent relapse of Bipolar symptoms 7- Valproic acid level/Chem 14 on 06/05/14  Medical Decision Making Problem Points:  Established problem, improving (1) ,Data Points:  Review or order clinical lab tests (1) Review of medication regiment & side effects (2) Review of new medications or change in dosage (2)  I certify that inpatient services furnished can reasonably be expected to improve the patient's condition.  MCorena Pilgrim MD 10:57 AM 06/04/2014

## 2014-06-04 NOTE — Progress Notes (Signed)
Patient ID: Christian [name], male   DOB: [birth year], 63 y.o.   MRN: [id number]  D: Patient has pressured speech on approach this am. Remains hypomanic and still giving notes to staff saying they are getting big sums of money from him. Patient demanding to leave today and made a threatening remark but then took it back after he thought it sounded threatening to himself. Gives depression and feelings of hopelessness "1" on scale and currently denies any SI/HI or a/v hallucinations. A: Staff will monitor on q 15 minute checks, follow treatment plan, and give meds as ordered. R: Needs some redirection at times but mostly cooperative thus far today.

## 2014-06-05 LAB — COMPREHENSIVE METABOLIC PANEL
ALT: 41 U/L (ref 0–53)
AST: 32 U/L (ref 0–37)
Albumin: 3.4 g/dL — ABNORMAL LOW (ref 3.5–5.2)
Alkaline Phosphatase: 82 U/L (ref 39–117)
Anion gap: 10 (ref 5–15)
BUN: 22 mg/dL (ref 6–23)
CO2: 32 mEq/L (ref 19–32)
Calcium: 9.6 mg/dL (ref 8.4–10.5)
Chloride: 93 mEq/L — ABNORMAL LOW (ref 96–112)
Creatinine, Ser: 1.41 mg/dL — ABNORMAL HIGH (ref 0.50–1.35)
GFR calc Af Amer: 61 mL/min — ABNORMAL LOW (ref >90)
GFR calc non Af Amer: 53 mL/min — ABNORMAL LOW (ref >90)
Glucose, Bld: 120 mg/dL — ABNORMAL HIGH (ref 70–99)
Potassium: 3.9 mEq/L (ref 3.7–5.3)
Sodium: 135 mEq/L — ABNORMAL LOW (ref 137–147)
Total Bilirubin: 0.3 mg/dL (ref 0.3–1.2)
Total Protein: 7.2 g/dL (ref 6.0–8.3)

## 2014-06-05 LAB — GLUCOSE, CAPILLARY
Glucose-Capillary: 128 mg/dL — ABNORMAL HIGH (ref 70–99)
Glucose-Capillary: 176 mg/dL — ABNORMAL HIGH (ref 70–99)

## 2014-06-05 LAB — VALPROIC ACID LEVEL: Valproic Acid Lvl: 67.5 ug/mL (ref 50.0–100.0)

## 2014-06-05 MED ORDER — INSULIN GLARGINE 100 UNIT/ML ~~LOC~~ SOLN: 55.0000 [IU] | Freq: Every day | SUBCUTANEOUS | Status: DC

## 2014-06-05 MED ORDER — LINAGLIPTIN 5 MG PO TABS: 5.0000 mg | ORAL_TABLET | Freq: Every day | ORAL | Status: DC

## 2014-06-05 MED ORDER — LORAZEPAM 0.5 MG PO TABS: 0.5000 mg | ORAL_TABLET | Freq: Two times a day (BID) | ORAL | Status: DC

## 2014-06-05 MED ORDER — INSULIN GLARGINE 100 UNIT/ML ~~LOC~~ SOLN: 15.0000 [IU] | Freq: Every day | SUBCUTANEOUS | Status: DC

## 2014-06-05 MED ORDER — GLIPIZIDE ER 10 MG PO TB24: 10.0000 mg | ORAL_TABLET | Freq: Two times a day (BID) | ORAL | Status: DC

## 2014-06-05 MED ORDER — HYDRALAZINE HCL 50 MG PO TABS: ORAL_TABLET | ORAL | Status: DC

## 2014-06-05 MED ORDER — ARIPIPRAZOLE 15 MG PO TABS: 15.0000 mg | ORAL_TABLET | Freq: Every day | ORAL | Status: DC

## 2014-06-05 MED ORDER — LOSARTAN POTASSIUM 100 MG PO TABS: 100.0000 mg | ORAL_TABLET | Freq: Every day | ORAL | Status: DC

## 2014-06-05 MED ORDER — ALLOPURINOL 300 MG PO TABS: 300.0000 mg | ORAL_TABLET | Freq: Every day | ORAL | Status: DC

## 2014-06-05 MED ORDER — DIVALPROEX SODIUM ER 500 MG PO TB24: 1000.0000 mg | ORAL_TABLET | Freq: Two times a day (BID) | ORAL | Status: DC

## 2014-06-05 MED ORDER — COLCHICINE 0.6 MG PO TABS: 0.6000 mg | ORAL_TABLET | Freq: Two times a day (BID) | ORAL | Status: DC

## 2014-06-05 MED ORDER — FUROSEMIDE 40 MG PO TABS: 40.0000 mg | ORAL_TABLET | Freq: Two times a day (BID) | ORAL | Status: DC

## 2014-06-05 MED ORDER — TRAZODONE HCL 100 MG PO TABS: 200.0000 mg | ORAL_TABLET | Freq: Every day | ORAL | Status: DC

## 2014-06-05 MED ORDER — METOPROLOL SUCCINATE ER 200 MG PO TB24: 200.0000 mg | ORAL_TABLET | Freq: Every day | ORAL | Status: DC

## 2014-06-05 MED ORDER — ATORVASTATIN CALCIUM 10 MG PO TABS: 10.0000 mg | ORAL_TABLET | Freq: Every day | ORAL | Status: DC

## 2014-06-05 NOTE — Progress Notes (Signed)
[facility] Adult Case Management Discharge Plan :  Will you be returning to the same living situation after discharge: Yes,  home At discharge, do you have transportation home?:Yes,  family Do you have the ability to pay for your medications:Yes,  insurance  Release of information consent forms completed and in the chart;  Patient's signature needed at discharge.  Patient to Follow up at: Follow-up Information   Follow up with Dr Nolen Mu On 06/17/2014. (10:15 on Monday with the Dr)    [facility]ow information:    [id number] [place]      Patient denies SI/HI:   Yes,  yes    Safety Planning and Suicide Prevention discussed:  Yes,  yes  Ida Rogue 06/05/2014, 10:37 AM

## 2014-06-05 NOTE — Discharge Summary (Signed)
Physician Discharge Summary Note  Patient:  Christian [name] is an 63 y.o., male MRN:  [id number] DOB:  06-15-1951 Patient phone:  [phone] (home)  Patient address:   [address] [place] [address],  Total Time spent with patient: Greater than 30 minutes  Date of Admission:  05/25/2014 Date of Discharge: 06/05/14  Reason for Admission: Mood stabilization  Discharge Diagnoses: Principal Problem:   Bipolar affective disorder, current episode manic Active Problems:   Hypertension   Type II or unspecified type diabetes mellitus with unspecified complication, uncontrolled   Other and unspecified hyperlipidemia   OSA on CPAP   Bilateral lower extremity edema: chronic with venous stasis changes   Gout   Psychiatric Specialty Exam: Physical Exam  ROS  Blood pressure 131/81, pulse 82, temperature 98.4 F (36.9 C), temperature source Oral, resp. rate 20, height 6' (1.829 m), weight 127.914 kg (282 lb), SpO2 99.00%.Body mass index is 38.24 kg/(m^2).   General Appearance: Fairly Groomed   Engineer, water:: Good   Speech: Clear and Coherent   Volume: Normal   Mood: Euthymic   Affect: Appropriate   Thought Process: Goal Directed   Orientation: Full (Time, Place, and Person)   Thought Content: grandiose   Suicidal Thoughts: No   Homicidal Thoughts: No   Memory: Immediate; Fair  Recent; Fair  Remote; Fair   Judgement: Fair   Insight: Fair   Psychomotor Activity: Normal   Concentration: Fair   Recall: [employer] of Knowledge:Good   Language: Fair   Akathisia: No   Handed: Right   AIMS (if indicated):   Assets: Communication Skills  Desire for Improvement  Physical Health   Sleep: Number of Hours: 5      Past Psychiatric History: Diagnosis: Bipolar affective disorder, manic episodes  Hospitalizations: [facility] adult unit  Outpatient Care: [place]  Substance Abuse Care: [facility]  Self-Mutilation: NA  Suicidal Attempts: NA  Violent Behaviors: NA    Musculoskeletal: Strength & Muscle Tone: within normal limits Gait & Station: normal Patient leans: N/A  DSM5: Schizophrenia Disorders:  NA Obsessive-Compulsive Disorders:  NA Trauma-Stressor Disorders:  NA Substance/Addictive Disorders:  NA Depressive Disorders:  Bipolar affective disorder, manic episodes  Axis Diagnosis:  AXIS I:  Bipolar affective disorder, manic episodes AXIS II:  Deferred AXIS III:   Past Medical History  Diagnosis Date  . Gout   . Hypertension   . Depression   . Mood swings   . Type II or unspecified type diabetes mellitus with unspecified complication, uncontrolled 06/02/2014  . Other and unspecified hyperlipidemia 06/02/2014  . OSA on CPAP 06/02/2014  . Bilateral lower extremity edema: chronic with venous stasis changes 06/02/2014   AXIS IV:  other psychosocial or environmental problems and Mental illness, chronic AXIS V:  62  Level of Care:  OP  Hospital Course:  Patient has a history of bipolar disorder. Presents with mania, Irritable upon approach feels that staff are "keeping me hostage" States that he made a threat against his son in law because he had threatened his safety. Patient presents as dysphoric and angry. Was up all night writing ensuring that his lawyer knew all of the wrongs that have been done to him.  While a patient in this hospital, Christian [name] received medication management to re-stabilize his current manic symptoms as he presents with a history of bipolar disorder. He was ordered, received and discharged on Abilify 15 mg daily for mood control, Lorazepam 0.5 mg prn for anxiety issues, Depakote  ER 1000 mg bid for mood stabilization and Trazodone 200 mg Q bedtime for sleep. She also was enrolled in the group counseling sessions and activities where he was counseled and learned coping skills that should help him cope better and manage his symptoms effectively after discharge. He also received other medication management and monitoring for his  other previously existing medical issues and concerns. He tolerated his treatment regimen without any significant adverse effects and or reactions presented.   Patient did respond positively to his treatment regimen. This is evidenced by his daily reports of improved mood, reduction of symptoms and presentation of good affect/eye contact. He attended treatment team meeting this am and met with his treatment team members. His reason for admission, response to treatment and discharge plans discussed with patient. Christian [name] endorsed that his symptoms has stabilized and that he is ready for discharge to pursue psychiatric care on outpatient basis. It was then agreed upon that patient will follow-up care at the [facility] here in [place], [place]. He is provided with all the pertinent information required to make this appointments without problems.  Upon discharge, Christian [name] adamantly denies any suicidal, homicidal ideations, auditory, visual hallucinations, paranoia and or delusional thoughts. He was provided with 14 days worth supply samples of his [facility] discharge medications. He left [facility] with all personal belongings in no apparent distress. Transportation per family..  Consults:  psychiatry  Significant Diagnostic Studies:  labs: CBC with diff, CMP, UDS, toxicology tests, U/A  Discharge Vitals:   Blood pressure 131/81, pulse 82, temperature 98.4 F (36.9 C), temperature source Oral, resp. rate 20, height 6' (1.829 m), weight 127.914 kg (282 lb), SpO2 99.00%. Body mass index is 38.24 kg/(m^2). Lab Results:   Results for orders placed during the hospital encounter of 05/25/14 (from the past 72 hour(s))  GLUCOSE, CAPILLARY     Status: Abnormal   Collection Time    06/02/14 12:14 PM      Result Value Ref Range   Glucose-Capillary 201 (*) 70 - 99 mg/dL  GLUCOSE, CAPILLARY     Status: Abnormal   Collection Time    06/02/14  4:57 PM      Result Value Ref Range   Glucose-Capillary 150 (*) 70 -  99 mg/dL  MAGNESIUM     Status: None   Collection Time    06/02/14  7:35 PM      Result Value Ref Range   Magnesium 1.7  1.5 - 2.5 mg/dL   Comment: Performed at [place] PANEL     Status: Abnormal   Collection Time    06/02/14  7:35 PM      Result Value Ref Range   Sodium 134 (*) 137 - 147 mEq/L   Potassium 4.1  3.7 - 5.3 mEq/L   Chloride 92 (*) 96 - 112 mEq/L   CO2 32  19 - 32 mEq/L   Glucose, Bld 130 (*) 70 - 99 mg/dL   BUN 26 (*) 6 - 23 mg/dL   Creatinine, Ser 1.59 (*) 0.50 - 1.35 mg/dL   Calcium 9.5  8.4 - 10.5 mg/dL   GFR calc non Af Amer 46 (*) >90 mL/min   GFR calc Af Amer 53 (*) >90 mL/min   Comment: (NOTE)     The eGFR has been calculated using the CKD EPI equation.     This calculation has not been validated in all clinical situations.     eGFR's persistently <90 mL/min signify  possible Chronic Kidney     Disease.   Anion gap 10  5 - 15   Comment: Performed at [place]     Status: None   Collection Time    06/02/14  7:35 PM      Result Value Ref Range   Albumin 3.5  3.5 - 5.2 g/dL   Comment: Performed at [place], CAPILLARY     Status: Abnormal   Collection Time    06/02/14  9:07 PM      Result Value Ref Range   Glucose-Capillary 127 (*) 70 - 99 mg/dL  GLUCOSE, CAPILLARY     Status: Abnormal   Collection Time    06/03/14  6:18 AM      Result Value Ref Range   Glucose-Capillary 200 (*) 70 - 99 mg/dL   Comment 1 Notify RN    GLUCOSE, CAPILLARY     Status: Abnormal   Collection Time    06/03/14 11:53 AM      Result Value Ref Range   Glucose-Capillary 174 (*) 70 - 99 mg/dL   Comment 1 Documented in Chart     Comment 2 Notify RN    GLUCOSE, CAPILLARY     Status: Abnormal   Collection Time    06/03/14  4:33 PM      Result Value Ref Range   Glucose-Capillary 200 (*) 70 - 99 mg/dL   Comment 1 Documented in Chart     Comment 2 Notify RN    GLUCOSE, CAPILLARY      Status: Abnormal   Collection Time    06/03/14  8:28 PM      Result Value Ref Range   Glucose-Capillary 142 (*) 70 - 99 mg/dL  GLUCOSE, CAPILLARY     Status: Abnormal   Collection Time    06/04/14  6:23 AM      Result Value Ref Range   Glucose-Capillary 136 (*) 70 - 99 mg/dL  GLUCOSE, CAPILLARY     Status: Abnormal   Collection Time    06/04/14 11:02 AM      Result Value Ref Range   Glucose-Capillary 134 (*) 70 - 99 mg/dL   Comment 1 Documented in Chart     Comment 2 Notify RN    GLUCOSE, CAPILLARY     Status: Abnormal   Collection Time    06/04/14  4:38 PM      Result Value Ref Range   Glucose-Capillary 186 (*) 70 - 99 mg/dL   Comment 1 Documented in Chart     Comment 2 Notify RN    GLUCOSE, CAPILLARY     Status: Abnormal   Collection Time    06/04/14  9:17 PM      Result Value Ref Range   Glucose-Capillary 144 (*) 70 - 99 mg/dL  VALPROIC ACID LEVEL     Status: None   Collection Time    06/05/14  6:15 AM      Result Value Ref Range   Valproic Acid Lvl 67.5  50.0 - 100.0 ug/mL   Comment: Performed at [place] PANEL     Status: Abnormal   Collection Time    06/05/14  6:15 AM      Result Value Ref Range   Sodium 135 (*) 137 - 147 mEq/L   Potassium 3.9  3.7 - 5.3 mEq/L   Chloride 93 (*) 96 - 112 mEq/L   CO2 32  19 - 32 mEq/L   Glucose, Bld 120 (*) 70 - 99 mg/dL   BUN 22  6 - 23 mg/dL   Creatinine, Ser 1.41 (*) 0.50 - 1.35 mg/dL   Calcium 9.6  8.4 - 10.5 mg/dL   Total Protein 7.2  6.0 - 8.3 g/dL   Albumin 3.4 (*) 3.5 - 5.2 g/dL   AST 32  0 - 37 U/L   ALT 41  0 - 53 U/L   Alkaline Phosphatase 82  39 - 117 U/L   Total Bilirubin 0.3  0.3 - 1.2 mg/dL   GFR calc non Af Amer 53 (*) >90 mL/min   GFR calc Af Amer 61 (*) >90 mL/min   Comment: (NOTE)     The eGFR has been calculated using the CKD EPI equation.     This calculation has not been validated in all clinical situations.     eGFR's persistently <90 mL/min signify possible  Chronic Kidney     Disease.   Anion gap 10  5 - 15   Comment: Performed at [place], CAPILLARY     Status: Abnormal   Collection Time    06/05/14  6:18 AM      Result Value Ref Range   Glucose-Capillary 128 (*) 70 - 99 mg/dL    Physical Findings: AIMS: Facial and Oral Movements Muscles of Facial Expression: None, normal Lips and Perioral Area: None, normal Jaw: None, normal Tongue: None, normal,Extremity Movements Upper (arms, wrists, hands, fingers): None, normal Lower (legs, knees, ankles, toes): None, normal, Trunk Movements Neck, shoulders, hips: None, normal, Overall Severity Severity of abnormal movements (highest score from questions above): None, normal Incapacitation due to abnormal movements: None, normal Patient's awareness of abnormal movements (rate only patient's report): No Awareness, Dental Status Current problems with teeth and/or dentures?: No Does patient usually wear dentures?: No  CIWA:  CIWA-Ar Total: 0 COWS:     Psychiatric Specialty Exam: See Psychiatric Specialty Exam and Suicide Risk Assessment completed by Attending Physician prior to discharge.  Discharge destination:  Home  Is patient on multiple antipsychotic therapies at discharge:  No   Has Patient had three or more failed trials of antipsychotic monotherapy by history:  No  Recommended Plan for Multiple Antipsychotic Therapies: NA     Medication List    STOP taking these medications       aspirin EC 81 MG tablet     latanoprost 0.005 % ophthalmic solution  Commonly known as:  XALATAN     multivitamin with minerals Tabs tablet     potassium chloride SA 20 MEQ tablet  Commonly known as:  K-DUR,KLOR-CON     QUEtiapine 400 MG 24 hr tablet  Commonly known as:  SEROQUEL XR     tamsulosin 0.4 MG Caps capsule  Commonly known as:  FLOMAX      TAKE these medications     Indication   allopurinol 300 MG tablet  Commonly known as:  ZYLOPRIM  Take 1  tablet (300 mg total) by mouth daily. For gout   Indication:  Gout     ARIPiprazole 15 MG tablet  Commonly known as:  ABILIFY  Take 1 tablet (15 mg total) by mouth daily. For mood control   Indication:  Mood control     atorvastatin 10 MG tablet  Commonly known as:  LIPITOR  Take 1 tablet (10 mg total) by mouth daily. For high cholesterol   Indication:  Inherited Heterozygous Hypercholesterolemia  colchicine 0.6 MG tablet  Take 1 tablet (0.6 mg total) by mouth 2 (two) times daily. For Gout pain   Indication:  Gout     divalproex 500 MG 24 hr tablet  Commonly known as:  DEPAKOTE ER  Take 2 tablets (1,000 mg total) by mouth 2 (two) times daily after a meal. For mood stabilization   Indication:  Manic Phase of Manic-Depression, Mood stabilization     furosemide 40 MG tablet  Commonly known as:  LASIX  Take 1-2 tablets (40-80 mg total) by mouth 2 (two) times daily. Take 2 tablets in the morning and 1 tablet in the evening: For swellings   Indication:  Edema     glipiZIDE 10 MG 24 hr tablet  Commonly known as:  GLUCOTROL XL  Take 1 tablet (10 mg total) by mouth 2 (two) times daily. For diabetes   Indication:  Type 2 Diabetes     hydrALAZINE 50 MG tablet  Commonly known as:  APRESOLINE  Take 1 tablet 3 times daily for HTN   Indication:  High Blood Pressure     insulin glargine 100 UNIT/ML injection  Commonly known as:  LANTUS  Inject 0.15 mLs (15 Units total) into the skin at bedtime. For diabetes   Indication:  Type 2 Diabetes     linagliptin 5 MG Tabs tablet  Commonly known as:  TRADJENTA  Take 1 tablet (5 mg total) by mouth daily. For diabetes   Indication:  Type 2 Diabetes     LORazepam 0.5 MG tablet  Commonly known as:  ATIVAN  Take 1 tablet (0.5 mg total) by mouth 2 (two) times daily. For anxiety   Indication:  Anxiousness associated with Depression     losartan 100 MG tablet  Commonly known as:  COZAAR  Take 1 tablet (100 mg total) by mouth daily. For high  blood pressure   Indication:  High Blood Pressure     metoprolol 200 MG 24 hr tablet  Commonly known as:  TOPROL-XL  Take 1 tablet (200 mg total) by mouth daily. For high blood pressure   Indication:  High Blood Pressure     traZODone 100 MG tablet  Commonly known as:  DESYREL  Take 2 tablets (200 mg total) by mouth at bedtime. For sleep   Indication:  Trouble Sleeping       Follow-up Information   Follow up with Dr [facility] On 06/17/2014. (10:15 on Monday with the Dr)    Minette Brine information:    Julian     Follow-up recommendations: Activity:  As tolerated Diet: As recommended by your primary care doctor. Keep all scheduled follow-up appointments as recommended.  Comments:  Take all your medications as prescribed by your mental healthcare provider. Report any adverse effects and or reactions from your medicines to your outpatient provider promptly. Patient is instructed and cautioned to not engage in alcohol and or illegal drug use while on prescription medicines. In the event of worsening symptoms, patient is instructed to call the crisis hotline, 911 and or go to the nearest ED for appropriate evaluation and treatment of symptoms. Follow-up with your primary care provider for your other medical issues, concerns and or health care needs.   Total Discharge Time:  Greater than 30 minutes.  Signed: Encarnacion Slates, PMHNp, FNP-BC 06/05/2014, 11:13 AM  Patient seen, evaluated and I agree with notes by Nurse Practitioner. Corena Pilgrim, MD

## 2014-06-05 NOTE — [facility] Suicide Risk Assessment (Signed)
   Demographic Factors:  Male and lives with his wife  Total Time spent with patient: 20 minutes  Psychiatric Specialty Exam: Physical Exam  Psychiatric: He has a normal mood and affect. His speech is normal and behavior is normal. Judgment and thought content normal. Cognition and memory are normal.    Review of Systems  Constitutional: Negative.   HENT: Negative.   Eyes: Negative.   Respiratory: Negative.   Cardiovascular: Negative.   Gastrointestinal: Negative.   Genitourinary: Negative.   Musculoskeletal: Negative.   Skin: Negative.   Neurological: Negative.   Endo/Heme/Allergies: Negative.   Psychiatric/Behavioral: Negative.     Blood pressure 131/81, pulse 82, temperature 98.4 F (36.9 C), temperature source Oral, resp. rate 20, height 6' (1.829 m), weight 127.914 kg (282 lb), SpO2 99.00%.Body mass index is 38.24 kg/(m^2).  General Appearance: Fairly Groomed  Patent attorney::  Good  Speech:  Clear and Coherent  Volume:  Normal  Mood:  Euthymic  Affect:  Appropriate  Thought Process:  Goal Directed  Orientation:  Full (Time, Place, and Person)  Thought Content:  grandiose  Suicidal Thoughts:  No  Homicidal Thoughts:  No  Memory:  Immediate;   Fair Recent;   Fair Remote;   Fair  Judgement:  Fair  Insight:  Fair  Psychomotor Activity:  Normal  Concentration:  Fair  Recall:  [employer] of Knowledge:Good  Language: Fair  Akathisia:  No  Handed:  Right  AIMS (if indicated):     Assets:  Communication Skills Desire for Improvement Physical Health  Sleep:  Number of Hours: 5    Musculoskeletal: Strength & Muscle Tone: within normal limits Gait & Station: normal Patient leans: N/A   Mental Status Per Nursing Assessment::   On Admission:     Current Mental Status by Physician: patient denies suicidal ideation, intent or plan  Loss Factors: NA  Historical Factors: NA  Risk Reduction Factors:   Sense of responsibility to family, Living with another  person, especially a relative and Positive social support  Continued Clinical Symptoms:  Resolving manic episode  Cognitive Features That Contribute To Risk:  Closed-mindedness Polarized thinking    Suicide Risk:  Minimal: No identifiable suicidal ideation.  Patients presenting with no risk factors but with morbid ruminations; may be classified as minimal risk based on the severity of the depressive symptoms  Discharge Diagnoses:   AXIS I:  Bipolar affective disorder, current episode manic  AXIS II:  Deferred AXIS III:   Past Medical History  Diagnosis Date  . Gout   . Hypertension   . Type II or unspecified type diabetes mellitus with unspecified complication, uncontrolled 06/02/2014  . Other and unspecified hyperlipidemia 06/02/2014  . OSA on CPAP 06/02/2014  . Bilateral lower extremity edema: chronic with venous stasis changes 06/02/2014   AXIS IV:  other psychosocial or environmental problems and problems related to social environment AXIS V:  61-70 mild symptoms  Plan Of Care/Follow-up recommendations:  Activity:  as tolerated Diet:  healthy Tests:  Depakote level: 62.4 Other:  patient to keep his after care appointment with Dr. Nolen Mu.  Is patient on multiple antipsychotic therapies at discharge:  No   Has Patient had three or more failed trials of antipsychotic monotherapy by history:  No  Recommended Plan for Multiple Antipsychotic Therapies: NA    Thedore Mins, MD 06/05/2014, 9:13 AM

## 2014-06-05 NOTE — Progress Notes (Signed)
Patient ID: Christian [name], male   DOB: [birth year], 63 y.o.   MRN: [id number]  D: Patient has improved since admission. No agitation the last couple of days. Treatment team felt he was better and could go home. Denies any SI/HI.  A: Obtained all belongings from room and locker, sample meds, prescriptions, and follow-up appointment. R: Wife in lobby to pick up at this time.

## 2014-06-05 NOTE — [facility] Suicide Risk Assessment (Signed)
[facility] INPATIENT:  Family/Significant Other Suicide Prevention Education  Suicide Prevention Education:  Education Completed; No one has been identified by the patient as the family member/significant other with whom the patient will be residing, and identified as the person(s) who will aid the patient in the event of a mental health crisis (suicidal ideations/suicide attempt).  With written consent from the patient, the family member/significant other has been provided the following suicide prevention education, prior to the and/or following the discharge of the patient.  The suicide prevention education provided includes the following:  Suicide risk factors  Suicide prevention and interventions  National Suicide Hotline telephone number  [facility] assessment telephone number  [facility] Emergency Assistance 911  [facility] and/or Residential Mobile Crisis Unit telephone number  Request made of family/significant other to:  Remove weapons (e.g., guns, rifles, knives), all items previously/currently identified as safety concern.    Remove drugs/medications (over-the-counter, prescriptions, illicit drugs), all items previously/currently identified as a safety concern.  The family member/significant other verbalizes understanding of the suicide prevention education information provided.  The family member/significant other agrees to remove the items of safety concern listed above. The patient did not endorse SI at the time of admission, nor did the patient c/o SI during the stay here.  SPE not required.    [name] 06/05/2014, 10:37 AM

## 2014-06-07 NOTE — Progress Notes (Signed)
Patient Discharge Instructions:  After Visit Summary (AVS):   Faxed to:  06/07/14 Discharge Summary Note:   Faxed to:  06/07/14 Psychiatric Admission Assessment Note:   Faxed to:  06/07/14 Suicide Risk Assessment - Discharge Assessment:   Faxed to:  06/07/14 Faxed/Sent to the Next Level Care provider:  06/07/14 Faxed to Dr. Emerson Monte @ [phone]  Jerelene Redden, 06/07/2014, 3:57 PM

## 2014-06-17 ENCOUNTER — Inpatient Hospital Stay (HOSPITAL_COMMUNITY)
Admission: EM | Admit: 2014-06-17 | Discharge: 2014-06-21 | DRG: 885 | Disposition: A | Discharge status: Other Acute Inpatient Hospital | Payer: Medicare HMO | Attending: Family Medicine | Admitting: Family Medicine

## 2014-06-17 ENCOUNTER — Emergency Department (HOSPITAL_COMMUNITY): Payer: Medicare HMO

## 2014-06-17 ENCOUNTER — Encounter (HOSPITAL_COMMUNITY): Payer: Self-pay | Admitting: Emergency Medicine

## 2014-06-17 DIAGNOSIS — IMO0002 Reserved for concepts with insufficient information to code with codable children: Secondary | ICD-10-CM

## 2014-06-17 DIAGNOSIS — M109 Gout, unspecified: Secondary | ICD-10-CM | POA: Diagnosis present

## 2014-06-17 DIAGNOSIS — F3110 Bipolar disorder, current episode manic without psychotic features, unspecified: Secondary | ICD-10-CM | POA: Diagnosis not present

## 2014-06-17 DIAGNOSIS — N3000 Acute cystitis without hematuria: Secondary | ICD-10-CM

## 2014-06-17 DIAGNOSIS — N390 Urinary tract infection, site not specified: Secondary | ICD-10-CM | POA: Diagnosis present

## 2014-06-17 DIAGNOSIS — G4733 Obstructive sleep apnea (adult) (pediatric): Secondary | ICD-10-CM | POA: Diagnosis present

## 2014-06-17 DIAGNOSIS — Z9989 Dependence on other enabling machines and devices: Secondary | ICD-10-CM

## 2014-06-17 DIAGNOSIS — F22 Delusional disorders: Secondary | ICD-10-CM | POA: Diagnosis present

## 2014-06-17 DIAGNOSIS — R600 Localized edema: Secondary | ICD-10-CM

## 2014-06-17 DIAGNOSIS — E118 Type 2 diabetes mellitus with unspecified complications: Secondary | ICD-10-CM

## 2014-06-17 DIAGNOSIS — I872 Venous insufficiency (chronic) (peripheral): Secondary | ICD-10-CM | POA: Diagnosis present

## 2014-06-17 DIAGNOSIS — R7881 Bacteremia: Secondary | ICD-10-CM | POA: Diagnosis present

## 2014-06-17 DIAGNOSIS — F319 Bipolar disorder, unspecified: Secondary | ICD-10-CM | POA: Diagnosis not present

## 2014-06-17 DIAGNOSIS — E119 Type 2 diabetes mellitus without complications: Secondary | ICD-10-CM | POA: Diagnosis present

## 2014-06-17 DIAGNOSIS — F312 Bipolar disorder, current episode manic severe with psychotic features: Secondary | ICD-10-CM | POA: Diagnosis present

## 2014-06-17 DIAGNOSIS — E785 Hyperlipidemia, unspecified: Secondary | ICD-10-CM | POA: Diagnosis present

## 2014-06-17 DIAGNOSIS — I10 Essential (primary) hypertension: Secondary | ICD-10-CM | POA: Diagnosis present

## 2014-06-17 DIAGNOSIS — E1165 Type 2 diabetes mellitus with hyperglycemia: Secondary | ICD-10-CM

## 2014-06-17 DIAGNOSIS — Z79899 Other long term (current) drug therapy: Secondary | ICD-10-CM

## 2014-06-17 LAB — CBC WITH DIFFERENTIAL/PLATELET
Basophils Absolute: 0 10*3/uL (ref 0.0–0.1)
Basophils Relative: 0 % (ref 0–1)
Eosinophils Absolute: 0 10*3/uL (ref 0.0–0.7)
Eosinophils Relative: 0 % (ref 0–5)
HCT: 38.7 % — ABNORMAL LOW (ref 39.0–52.0)
Hemoglobin: 12.6 g/dL — ABNORMAL LOW (ref 13.0–17.0)
Lymphocytes Relative: 7 % — ABNORMAL LOW (ref 12–46)
Lymphs Abs: 0.5 10*3/uL — ABNORMAL LOW (ref 0.7–4.0)
MCH: 29.2 pg (ref 26.0–34.0)
MCHC: 32.6 g/dL (ref 30.0–36.0)
MCV: 89.6 fL (ref 78.0–100.0)
Monocytes Absolute: 0.9 10*3/uL (ref 0.1–1.0)
Monocytes Relative: 14 % — ABNORMAL HIGH (ref 3–12)
Neutro Abs: 5.4 10*3/uL (ref 1.7–7.7)
Neutrophils Relative %: 79 % — ABNORMAL HIGH (ref 43–77)
Platelets: 137 10*3/uL — ABNORMAL LOW (ref 150–400)
RBC: 4.32 MIL/uL (ref 4.22–5.81)
RDW: 13.5 % (ref 11.5–15.5)
WBC: 6.9 10*3/uL (ref 4.0–10.5)

## 2014-06-17 LAB — URINALYSIS, ROUTINE W REFLEX MICROSCOPIC
Bilirubin Urine: NEGATIVE
Glucose, UA: NEGATIVE mg/dL
Hgb urine dipstick: NEGATIVE
Ketones, ur: NEGATIVE mg/dL
Nitrite: NEGATIVE
Protein, ur: 100 mg/dL — AB
Specific Gravity, Urine: 1.014 (ref 1.005–1.030)
Urobilinogen, UA: 1 mg/dL (ref 0.0–1.0)
pH: 5.5 (ref 5.0–8.0)

## 2014-06-17 LAB — ACETAMINOPHEN LEVEL: Acetaminophen (Tylenol), Serum: <15 ug/mL (ref 10–30)

## 2014-06-17 LAB — COMPREHENSIVE METABOLIC PANEL
ALT: 41 U/L (ref 0–53)
AST: 47 U/L — ABNORMAL HIGH (ref 0–37)
Albumin: 3.4 g/dL — ABNORMAL LOW (ref 3.5–5.2)
Alkaline Phosphatase: 68 U/L (ref 39–117)
Anion gap: 10 (ref 5–15)
BUN: 30 mg/dL — ABNORMAL HIGH (ref 6–23)
CO2: 30 mEq/L (ref 19–32)
Calcium: 9.2 mg/dL (ref 8.4–10.5)
Chloride: 99 mEq/L (ref 96–112)
Creatinine, Ser: 1.84 mg/dL — ABNORMAL HIGH (ref 0.50–1.35)
GFR calc Af Amer: 44 mL/min — ABNORMAL LOW (ref >90)
GFR calc non Af Amer: 38 mL/min — ABNORMAL LOW (ref >90)
Glucose, Bld: 221 mg/dL — ABNORMAL HIGH (ref 70–99)
Potassium: 4.2 mEq/L (ref 3.7–5.3)
Sodium: 139 mEq/L (ref 137–147)
Total Bilirubin: 0.4 mg/dL (ref 0.3–1.2)
Total Protein: 7 g/dL (ref 6.0–8.3)

## 2014-06-17 LAB — URINE MICROSCOPIC-ADD ON

## 2014-06-17 LAB — SALICYLATE LEVEL: Salicylate Lvl: <2 mg/dL — ABNORMAL LOW (ref 2.8–20.0)

## 2014-06-17 LAB — VALPROIC ACID LEVEL: Valproic Acid Lvl: 40.8 ug/mL — ABNORMAL LOW (ref 50.0–100.0)

## 2014-06-17 LAB — RAPID URINE DRUG SCREEN, HOSP PERFORMED
Amphetamines: NOT DETECTED
Barbiturates: NOT DETECTED
Benzodiazepines: NOT DETECTED
Cocaine: NOT DETECTED
Opiates: NOT DETECTED
Tetrahydrocannabinol: NOT DETECTED

## 2014-06-17 LAB — CBG MONITORING, ED
Glucose-Capillary: 69 mg/dL — ABNORMAL LOW (ref 70–99)
Glucose-Capillary: 133 mg/dL — ABNORMAL HIGH (ref 70–99)
Glucose-Capillary: 94 mg/dL (ref 70–99)
Glucose-Capillary: 71 mg/dL (ref 70–99)

## 2014-06-17 LAB — ETHANOL: Alcohol, Ethyl (B): <11 mg/dL (ref 0–11)

## 2014-06-17 MED ORDER — COLCHICINE 0.6 MG PO TABS
0.6000 mg | ORAL_TABLET | Freq: Two times a day (BID) | ORAL | Status: DC
  Administered 2014-06-17 – 2014-06-21 (×9): 0.6 mg via ORAL
  Filled 2014-06-17 (×11): qty 1

## 2014-06-17 MED ORDER — GLIPIZIDE ER 10 MG PO TB24
10.0000 mg | ORAL_TABLET | Freq: Two times a day (BID) | ORAL | Status: DC
  Filled 2014-06-17 (×2): qty 1

## 2014-06-17 MED ORDER — HYDRALAZINE HCL 50 MG PO TABS
50.0000 mg | ORAL_TABLET | Freq: Three times a day (TID) | ORAL | Status: DC
  Administered 2014-06-17 – 2014-06-21 (×9): 50 mg via ORAL
  Filled 2014-06-17 (×17): qty 1

## 2014-06-17 MED ORDER — INSULIN ASPART 100 UNIT/ML ~~LOC~~ SOLN
0.0000 [IU] | Freq: Three times a day (TID) | SUBCUTANEOUS | Status: DC
  Administered 2014-06-18 – 2014-06-19 (×3): 3 [IU] via SUBCUTANEOUS
  Administered 2014-06-19: 8 [IU] via SUBCUTANEOUS
  Administered 2014-06-20 (×2): 2 [IU] via SUBCUTANEOUS
  Filled 2014-06-17 (×2): qty 1

## 2014-06-17 MED ORDER — LORAZEPAM 1 MG PO TABS
1.0000 mg | ORAL_TABLET | Freq: Three times a day (TID) | ORAL | Status: DC | PRN
  Administered 2014-06-17 – 2014-06-21 (×5): 1 mg via ORAL
  Filled 2014-06-17 (×6): qty 1

## 2014-06-17 MED ORDER — LORAZEPAM 0.5 MG PO TABS
0.5000 mg | ORAL_TABLET | Freq: Two times a day (BID) | ORAL | Status: DC
  Administered 2014-06-17 – 2014-06-21 (×8): 0.5 mg via ORAL
  Filled 2014-06-17 (×7): qty 1

## 2014-06-17 MED ORDER — ARIPIPRAZOLE 15 MG PO TABS
15.0000 mg | ORAL_TABLET | Freq: Every day | ORAL | Status: DC
  Administered 2014-06-17 – 2014-06-20 (×4): 15 mg via ORAL
  Filled 2014-06-17 (×5): qty 1

## 2014-06-17 MED ORDER — INSULIN GLARGINE 100 UNIT/ML ~~LOC~~ SOLN
15.0000 [IU] | Freq: Every day | SUBCUTANEOUS | Status: DC
  Administered 2014-06-19 – 2014-06-20 (×2): 15 [IU] via SUBCUTANEOUS
  Filled 2014-06-17 (×4): qty 0.15

## 2014-06-17 MED ORDER — ALLOPURINOL 300 MG PO TABS
300.0000 mg | ORAL_TABLET | Freq: Every day | ORAL | Status: DC
  Administered 2014-06-17 – 2014-06-21 (×5): 300 mg via ORAL
  Filled 2014-06-17 (×6): qty 1

## 2014-06-17 MED ORDER — IBUPROFEN 600 MG PO TABS
600.0000 mg | ORAL_TABLET | Freq: Three times a day (TID) | ORAL | Status: DC | PRN
  Filled 2014-06-17: qty 3
  Filled 2014-06-17: qty 1

## 2014-06-17 MED ORDER — ATORVASTATIN CALCIUM 10 MG PO TABS
10.0000 mg | ORAL_TABLET | Freq: Every day | ORAL | Status: DC
  Administered 2014-06-17 – 2014-06-21 (×5): 10 mg via ORAL
  Filled 2014-06-17 (×6): qty 1

## 2014-06-17 MED ORDER — DIVALPROEX SODIUM ER 500 MG PO TB24
1000.0000 mg | ORAL_TABLET | Freq: Two times a day (BID) | ORAL | Status: DC
  Administered 2014-06-17 – 2014-06-21 (×9): 1000 mg via ORAL
  Filled 2014-06-17 (×13): qty 2

## 2014-06-17 MED ORDER — METOPROLOL SUCCINATE ER 100 MG PO TB24
200.0000 mg | ORAL_TABLET | Freq: Every day | ORAL | Status: DC
  Administered 2014-06-17 – 2014-06-21 (×5): 200 mg via ORAL
  Filled 2014-06-17 (×5): qty 2

## 2014-06-17 MED ORDER — DEXTROSE 5 % IV SOLN
1.0000 g | Freq: Once | INTRAVENOUS | Status: AC
  Administered 2014-06-18: 1 g via INTRAVENOUS
  Filled 2014-06-17: qty 10

## 2014-06-17 MED ORDER — CEPHALEXIN 500 MG PO CAPS
500.0000 mg | ORAL_CAPSULE | Freq: Two times a day (BID) | ORAL | Status: DC
  Administered 2014-06-17 (×2): 500 mg via ORAL
  Filled 2014-06-17 (×2): qty 1

## 2014-06-17 MED ORDER — GLIPIZIDE ER 10 MG PO TB24: 10.0000 mg | ORAL_TABLET | Freq: Every day | ORAL | Status: DC

## 2014-06-17 MED ORDER — FUROSEMIDE 40 MG PO TABS
40.0000 mg | ORAL_TABLET | Freq: Two times a day (BID) | ORAL | Status: DC
  Administered 2014-06-17: 80 mg via ORAL
  Administered 2014-06-17 – 2014-06-18 (×2): 40 mg via ORAL
  Administered 2014-06-18 – 2014-06-19 (×2): 80 mg via ORAL
  Filled 2014-06-17: qty 2
  Filled 2014-06-17 (×2): qty 1
  Filled 2014-06-17 (×2): qty 2

## 2014-06-17 MED ORDER — TRAZODONE HCL 100 MG PO TABS
200.0000 mg | ORAL_TABLET | Freq: Every day | ORAL | Status: DC
  Administered 2014-06-17 – 2014-06-20 (×4): 200 mg via ORAL
  Filled 2014-06-17 (×5): qty 2
  Filled 2014-06-17: qty 4

## 2014-06-17 MED ORDER — ACETAMINOPHEN 325 MG PO TABS
650.0000 mg | ORAL_TABLET | ORAL | Status: DC | PRN
  Administered 2014-06-17 – 2014-06-18 (×3): 650 mg via ORAL
  Filled 2014-06-17 (×3): qty 2

## 2014-06-17 MED ORDER — LOSARTAN POTASSIUM 50 MG PO TABS
100.0000 mg | ORAL_TABLET | Freq: Every day | ORAL | Status: DC
  Administered 2014-06-17 – 2014-06-21 (×5): 100 mg via ORAL
  Filled 2014-06-17 (×5): qty 2

## 2014-06-17 NOTE — BH Assessment (Signed)
Clinician attempted assessment. Pt unable to be assessed at this time.  '

## 2014-06-17 NOTE — Consult Note (Signed)
[facility] Face-to-Face Psychiatry Consult   Reason for Consult:  Psychosis Referring Physician:  [name] is an 63 y.o. male. Total Time spent with patient: 20 minutes  Assessment: AXIS I:  Bipolar, Manic AXIS II:  Deferred AXIS III:   Past Medical History  Diagnosis Date  . Gout   . Hypertension   . Depression   . Mood swings   . Type II or unspecified type diabetes mellitus with unspecified complication, uncontrolled 06/02/2014  . Other and unspecified hyperlipidemia 06/02/2014  . OSA on CPAP 06/02/2014  . Bilateral lower extremity edema: chronic with venous stasis changes 06/02/2014   AXIS IV:  other psychosocial or environmental problems, problems related to social environment and problems with primary support group AXIS V:  21-30 behavior considerably influenced by delusions or hallucinations OR serious impairment in judgment, communication OR inability to function in almost all areas  Plan:  Recommend psychiatric Inpatient admission when medically cleared.  Subjective:   Christian [name] is a 63 y.o. male patient admitted with psychosis.  HPI:  Patient psychotic an his wife felt he was unsafe.  The patient is hallucinating, denies suicidal/homicidal ideations and drug/alcohol use.  Pt arrived by [facility] Deputy-pt has been IVC'ed by his wife for psychotic behavior, not taking his medications, and states hx suicide attempt in the past and wife stated in IVC paperwork that a pack of razor blades "were missing from the house"  HPI Elements:   Location:  generalized. Quality:  acute. Severity:  mild. Timing:  constant. Duration:  few days. Context:  chronic mental illness.  Past Psychiatric History: Past Medical History  Diagnosis Date  . Gout   . Hypertension   . Depression   . Mood swings   . Type II or unspecified type diabetes mellitus with unspecified complication, uncontrolled 06/02/2014  . Other and unspecified hyperlipidemia 06/02/2014  . OSA on CPAP 06/02/2014  .  Bilateral lower extremity edema: chronic with venous stasis changes 06/02/2014    reports that he has never smoked. He does not have any smokeless tobacco history on file. He reports that he does not drink alcohol or use illicit drugs. History reviewed. No pertinent family history.         Allergies:  No Known Allergies  ACT Assessment Complete:  Yes:    Educational Status    Risk to Self: Risk to self Is patient at risk for suicide?: No Substance abuse history and/or treatment for substance abuse?: No  Risk to Others:    Abuse:    Prior Inpatient Therapy:    Prior Outpatient Therapy:    Additional Information:                    Objective: Blood pressure 126/62, pulse 82, temperature 98.6 F (37 C), temperature source Oral, resp. rate 18, height 6' (1.829 m), weight 277 lb (125.646 kg), SpO2 96.00%.Body mass index is 37.56 kg/(m^2). Results for orders placed during the hospital encounter of 06/17/14 (from the past 72 hour(s))  CBC WITH DIFFERENTIAL     Status: Abnormal   Collection Time    06/17/14  1:30 AM      Result Value Ref Range   WBC 6.9  4.0 - 10.5 K/uL   RBC 4.32  4.22 - 5.81 MIL/uL   Hemoglobin 12.6 (*) 13.0 - 17.0 g/dL   HCT 38.7 (*) 39.0 - 52.0 %   MCV 89.6  78.0 - 100.0 fL   MCH 29.2  26.0 -  34.0 pg   MCHC 32.6  30.0 - 36.0 g/dL   RDW 13.5  11.5 - 15.5 %   Platelets 137 (*) 150 - 400 K/uL   Neutrophils Relative % 79 (*) 43 - 77 %   Neutro Abs 5.4  1.7 - 7.7 K/uL   Lymphocytes Relative 7 (*) 12 - 46 %   Lymphs Abs 0.5 (*) 0.7 - 4.0 K/uL   Monocytes Relative 14 (*) 3 - 12 %   Monocytes Absolute 0.9  0.1 - 1.0 K/uL   Eosinophils Relative 0  0 - 5 %   Eosinophils Absolute 0.0  0.0 - 0.7 K/uL   Basophils Relative 0  0 - 1 %   Basophils Absolute 0.0  0.0 - 0.1 K/uL  COMPREHENSIVE METABOLIC PANEL     Status: Abnormal   Collection Time    06/17/14  1:30 AM      Result Value Ref Range   Sodium 139  137 - 147 mEq/L   Potassium 4.2  3.7 - 5.3 mEq/L    Chloride 99  96 - 112 mEq/L   CO2 30  19 - 32 mEq/L   Glucose, Bld 221 (*) 70 - 99 mg/dL   BUN 30 (*) 6 - 23 mg/dL   Creatinine, Ser 1.84 (*) 0.50 - 1.35 mg/dL   Calcium 9.2  8.4 - 10.5 mg/dL   Total Protein 7.0  6.0 - 8.3 g/dL   Albumin 3.4 (*) 3.5 - 5.2 g/dL   AST 47 (*) 0 - 37 U/L   ALT 41  0 - 53 U/L   Alkaline Phosphatase 68  39 - 117 U/L   Total Bilirubin 0.4  0.3 - 1.2 mg/dL   GFR calc non Af Amer 38 (*) >90 mL/min   GFR calc Af Amer 44 (*) >90 mL/min   Comment: (NOTE)     The eGFR has been calculated using the CKD EPI equation.     This calculation has not been validated in all clinical situations.     eGFR's persistently <90 mL/min signify possible Chronic Kidney     Disease.   Anion gap 10  5 - 15  ETHANOL     Status: None   Collection Time    06/17/14  1:30 AM      Result Value Ref Range   Alcohol, Ethyl (B) <11  0 - 11 mg/dL   Comment:            LOWEST DETECTABLE LIMIT FOR     SERUM ALCOHOL IS 11 mg/dL     FOR MEDICAL PURPOSES ONLY  ACETAMINOPHEN LEVEL     Status: None   Collection Time    06/17/14  1:30 AM      Result Value Ref Range   Acetaminophen (Tylenol), Serum <15.0  10 - 30 ug/mL   Comment:            THERAPEUTIC CONCENTRATIONS VARY     SIGNIFICANTLY. A RANGE OF 10-30     ug/mL MAY BE AN EFFECTIVE     CONCENTRATION FOR MANY PATIENTS.     HOWEVER, SOME ARE BEST TREATED     AT CONCENTRATIONS OUTSIDE THIS     RANGE.     ACETAMINOPHEN CONCENTRATIONS     >150 ug/mL AT 4 HOURS AFTER     INGESTION AND >50 ug/mL AT 12     HOURS AFTER INGESTION ARE     OFTEN ASSOCIATED WITH TOXIC     REACTIONS.  SALICYLATE LEVEL  Status: Abnormal   Collection Time    06/17/14  1:30 AM      Result Value Ref Range   Salicylate Lvl <4.6 (*) 2.8 - 20.0 mg/dL  URINE RAPID DRUG SCREEN (HOSP PERFORMED)     Status: None   Collection Time    06/17/14  5:17 AM      Result Value Ref Range   Opiates NONE DETECTED  NONE DETECTED   Cocaine NONE DETECTED  NONE DETECTED    Benzodiazepines NONE DETECTED  NONE DETECTED   Amphetamines NONE DETECTED  NONE DETECTED   Tetrahydrocannabinol NONE DETECTED  NONE DETECTED   Barbiturates NONE DETECTED  NONE DETECTED   Comment:            DRUG SCREEN FOR MEDICAL PURPOSES     ONLY.  IF CONFIRMATION IS NEEDED     FOR ANY PURPOSE, NOTIFY LAB     WITHIN 5 DAYS.                LOWEST DETECTABLE LIMITS     FOR URINE DRUG SCREEN     Drug Class       Cutoff (ng/mL)     Amphetamine      1000     Barbiturate      200     Benzodiazepine   962     Tricyclics       952     Opiates          300     Cocaine          300     THC              50  URINALYSIS, ROUTINE W REFLEX MICROSCOPIC     Status: Abnormal   Collection Time    06/17/14  5:17 AM      Result Value Ref Range   Color, Urine YELLOW  YELLOW   APPearance CLOUDY (*) CLEAR   Specific Gravity, Urine 1.014  1.005 - 1.030   pH 5.5  5.0 - 8.0   Glucose, UA NEGATIVE  NEGATIVE mg/dL   Hgb urine dipstick NEGATIVE  NEGATIVE   Bilirubin Urine NEGATIVE  NEGATIVE   Ketones, ur NEGATIVE  NEGATIVE mg/dL   Protein, ur 100 (*) NEGATIVE mg/dL   Urobilinogen, UA 1.0  0.0 - 1.0 mg/dL   Nitrite NEGATIVE  NEGATIVE   Leukocytes, UA SMALL (*) NEGATIVE  URINE MICROSCOPIC-ADD ON     Status: Abnormal   Collection Time    06/17/14  5:17 AM      Result Value Ref Range   Squamous Epithelial / LPF RARE  RARE   WBC, UA 11-20  <3 WBC/hpf   Comment: CLUMPS   Bacteria, UA MANY (*) RARE  CBG MONITORING, ED     Status: Abnormal   Collection Time    06/17/14  8:14 AM      Result Value Ref Range   Glucose-Capillary 69 (*) 70 - 99 mg/dL  CBG MONITORING, ED     Status: Abnormal   Collection Time    06/17/14 10:05 AM      Result Value Ref Range   Glucose-Capillary 133 (*) 70 - 99 mg/dL  VALPROIC ACID LEVEL     Status: Abnormal   Collection Time    06/17/14 11:30 AM      Result Value Ref Range   Valproic Acid Lvl 40.8 (*) 50.0 - 100.0 ug/mL   Comment: Performed at [facility]  CBG MONITORING, ED     Status: None   Collection Time    06/17/14 11:39 AM      Result Value Ref Range   Glucose-Capillary 94  70 - 99 mg/dL  CBG MONITORING, ED     Status: None   Collection Time    06/17/14  1:11 PM      Result Value Ref Range   Glucose-Capillary 71  70 - 99 mg/dL   Labs are reviewed and are pertinent for no medical issues noted.  Current Facility-Administered Medications  Medication Dose Route Frequency Provider Last Rate Last Dose  . acetaminophen (TYLENOL) tablet 650 mg  650 mg Oral Q4H PRN Houston Siren III, MD   650 mg at 06/17/14 [phone]  . atorvastatin (LIPITOR) tablet 10 mg  10 mg Oral Daily Houston Siren III, MD   10 mg at 06/17/14 9604  . cephALEXin (KEFLEX) capsule 500 mg  500 mg Oral Q12H Houston Siren III, MD   500 mg at 06/17/14 5409  . colchicine tablet 0.6 mg  0.6 mg Oral BID Wandra Arthurs, MD   0.6 mg at 06/17/14 1429  . divalproex (DEPAKOTE ER) 24 hr tablet 1,000 mg  1,000 mg Oral BID PC Houston Siren III, MD   1,000 mg at 06/17/14 8119  . furosemide (LASIX) tablet 40-80 mg  40-80 mg Oral BID Houston Siren III, MD   80 mg at 06/17/14 1478  . hydrALAZINE (APRESOLINE) tablet 50 mg  50 mg Oral 3 times per day Houston Siren III, MD   50 mg at 06/17/14 1435  . ibuprofen (ADVIL,MOTRIN) tablet 600 mg  600 mg Oral Q8H PRN Houston Siren III, MD      . insulin aspart (novoLOG) injection 0-15 Units  0-15 Units Subcutaneous TID WC Houston Siren III, MD      . insulin glargine (LANTUS) injection 15 Units  15 Units Subcutaneous QHS Houston Siren III, MD      . LORazepam (ATIVAN) tablet 1 mg  1 mg Oral Q8H PRN Houston Siren III, MD   1 mg at 06/17/14 1435  . losartan (COZAAR) tablet 100 mg  100 mg Oral Daily Houston Siren III, MD   100 mg at 06/17/14 2956  . metoprolol succinate (TOPROL-XL) 24 hr tablet 200 mg  200 mg Oral Daily Houston Siren III, MD   200 mg at 06/17/14 2130   Current Outpatient Prescriptions   Medication Sig Dispense Refill  . allopurinol (ZYLOPRIM) 300 MG tablet Take 1 tablet (300 mg total) by mouth daily. For gout      . atorvastatin (LIPITOR) 10 MG tablet Take 1 tablet (10 mg total) by mouth daily. For high cholesterol      . colchicine 0.6 MG tablet Take 1 tablet (0.6 mg total) by mouth 2 (two) times daily. For Gout pain      . furosemide (LASIX) 40 MG tablet Take 1-2 tablets (40-80 mg total) by mouth 2 (two) times daily. Take 2 tablets in the morning and 1 tablet in the evening: For swellings  30 tablet    . glipiZIDE (GLUCOTROL XL) 10 MG 24 hr tablet Take 1 tablet (10 mg total) by mouth 2 (two) times daily. For diabetes      . hydrALAZINE (APRESOLINE) 50 MG tablet Take 1 tablet 3 times daily for HTN  30 tablet  0  . LORazepam (ATIVAN) 0.5 MG tablet Take 0.5 mg by mouth 2 (  two) times daily. For anxiety      . losartan (COZAAR) 100 MG tablet Take 1 tablet (100 mg total) by mouth daily. For high blood pressure  30 tablet  0  . metoprolol (TOPROL-XL) 200 MG 24 hr tablet Take 1 tablet (200 mg total) by mouth daily. For high blood pressure      . traZODone (DESYREL) 100 MG tablet Take 2 tablets (200 mg total) by mouth at bedtime. For sleep  30 tablet  0  . ARIPiprazole (ABILIFY) 15 MG tablet Take 1 tablet (15 mg total) by mouth daily. For mood control  30 tablet  0  . divalproex (DEPAKOTE ER) 500 MG 24 hr tablet Take 2 tablets (1,000 mg total) by mouth 2 (two) times daily after a meal. For mood stabilization  120 tablet  0    Psychiatric Specialty Exam:     Blood pressure 126/62, pulse 82, temperature 98.6 F (37 C), temperature source Oral, resp. rate 18, height 6' (1.829 m), weight 277 lb (125.646 kg), SpO2 96.00%.Body mass index is 37.56 kg/(m^2).  General Appearance: Casual  Eye Contact::  Fair  Speech:  Normal Rate  Volume:  Normal  Mood:  Anxious  Affect:  Blunt  Thought Process:  Irrelevant  Orientation:  Full (Time, Place, and Person)  Thought Content:   Hallucinations: Auditory Visual  Suicidal Thoughts:  No  Homicidal Thoughts:  No  Memory:  Immediate;   Fair Recent;   Poor Remote;   Fair  Judgement:  Fair  Insight:  Lacking  Psychomotor Activity:  Normal  Concentration:  Fair  Recall:  [employer] [place]: Fair  Akathisia:  No  Handed:  Right  AIMS (if indicated):     Assets:  Catering manager Housing Leisure Time Resilience Social Support  Sleep:      Musculoskeletal: Strength & Muscle Tone: within normal limits Gait & Station: normal Patient leans: N/A  Treatment Plan Summary: Daily contact with patient to assess and evaluate symptoms and progress in treatment Medication management; admit to inpatient hospitalization  Waylan Boga, PMH-NP 06/17/2014 5:45 PM

## 2014-06-17 NOTE — ED Notes (Signed)
Pt arrives by [facility] Deputy-pt has been IVC'ed by his wife for psychotic behavior, not taking his medications, and states hx suicide attempt in the past and wife stated in IVC paperwork that a pack of razor blades "were missing from the house"

## 2014-06-17 NOTE — ED Notes (Signed)
Pharmacy called, will send Depakote.

## 2014-06-17 NOTE — ED Notes (Addendum)
Pt reports cough for weeks with productive cough and green sputum. Pt report cough clearing up.   Pt currently transported to xray.

## 2014-06-17 NOTE — ED Notes (Addendum)
Upon receiving shift report, entered pt's room pt is shaking and "cold" with several warm blankets covering self. Blankets removed and sheet applied. Upon assessment, pt has pitting edema to feet and lower extremities bilaterally. Pt complaint of foot pain. Pt reports was brought in for "broken ankles" by sheriffs and wife had him sent. Pt denies SI/HI and denies auditory/visual hallucinations at present time. Pt alert and oriented x4.

## 2014-06-17 NOTE — ED Notes (Signed)
Pt out of room for x-ray 

## 2014-06-17 NOTE — ED Notes (Signed)
Post assessment, pt is resting and is NOT shaking.

## 2014-06-17 NOTE — ED Notes (Signed)
Wofford MD aware of shift assessment results including vitals signs and psych assessment. Wofford MD confirms continue plan to have pt evaluated by psych/TTS.

## 2014-06-17 NOTE — BHH Counselor (Signed)
Sent Pt referral to  [facility]. [facility] Jeral Fruit, [facility] Triage Specialist 06/17/2014 7:58 PM

## 2014-06-17 NOTE — ED Notes (Signed)
Yao MD aware of pt current blood sugar reading. Per Silverio Lay given pt cup of orange juice. Pt given orange juice.

## 2014-06-17 NOTE — ED Notes (Signed)
Yao MD notified of pt current temperature.

## 2014-06-17 NOTE — ED Provider Notes (Signed)
I was asked to see patient as he has spiked a fever again. He's currently on Keflex for a UTI. He is also having a recurrent gout flare. At this point the patient has a hard in the 80s, blood pressure of 1:30, and is otherwise well appearing. He does not appear septic. His labs late last night showed a normal white blood cell count and mildly elevated creatinine. At this time, I feel he can continue with oral antibiotics, will continue to monitor vital signs, and we'll repeat labs in the morning as he has not been placed yet and will continue to be in the ER.  Audree Camel, MD 06/17/14 2017

## 2014-06-17 NOTE — ED Provider Notes (Addendum)
  Physical Exam  BP 146/77  Pulse 87  Temp(Src) 100.8 F (38.2 C) (Oral)  Resp 18  Ht 6' (1.829 m)  Wt 277 lb (125.646 kg)  BMI 37.56 kg/m2  SpO2 99%  Physical Exam  ED Course  Procedures  I was called by RN for low grade temp of 100.8. Patient had nl WBC count last night. Has have productive cough but CXR nl. Also had UTI on UA so is on keflex. He states that he has gout flare currently causing his leg swelling, which can cause low grade temp. Doesn't look septic. Will continue keflex.    Richardean Canal, MD 06/17/14 1112  3:20 PM Depakote level slightly low. Likely didn't take meds at home. Will continue depakote. Medically cleared.   Richardean Canal, MD 06/17/14 1520

## 2014-06-17 NOTE — BH Assessment (Signed)
Received a call for an assessment. Spoke with Dr. Loretha Stapler who reported that pt's family took out IVC paperwork due to pt wandering around and making deals with strangers. Pt reported that he has been cleaning his house all night. Pt's speech is pressured. Assessment will be initiated.

## 2014-06-17 NOTE — ED Provider Notes (Addendum)
CSN: [id number]     Arrival date & time 06/17/14  0031 History   First MD Initiated Contact with Patient 06/17/14 0041     Chief Complaint  Patient presents with  . Psychotic    . Leg Injury    right lower leg and foot     (Consider location/radiation/quality/duration/timing/severity/associated sxs/prior Treatment) Patient is a 63 y.o. male presenting with mental health disorder.  Mental Health Problem Presenting symptoms: bizarre behavior   Degree of incapacity (severity):  Moderate Onset quality:  Gradual Timing:  Unable to specify Progression:  Unable to specify Chronicity:  Recurrent Context: not noncompliant   Treatment compliance:  All of the time Relieved by:  Nothing Worsened by:  Nothing tried Associated symptoms: decreased need for sleep   Associated symptoms comment:  Right ankle pain - "I twisted in trying to walk over junk that was on the floor". Reports that family was worried about him because he was "cleaning the house" but reports that he "had to tear it all down in order to clean it up".   Past Medical History  Diagnosis Date  . Gout   . Hypertension   . Depression   . Mood swings   . Type II or unspecified type diabetes mellitus with unspecified complication, uncontrolled 06/02/2014  . Other and unspecified hyperlipidemia 06/02/2014  . OSA on CPAP 06/02/2014  . Bilateral lower extremity edema: chronic with venous stasis changes 06/02/2014   History reviewed. No pertinent past surgical history. No family history on file. History  Substance Use Topics  . Smoking status: Never Smoker   . Smokeless tobacco: Not on file  . Alcohol Use: No    Review of Systems  All other systems reviewed and are negative.     Allergies  Review of patient's allergies indicates no known allergies.  Home Medications   Prior to Admission medications   Medication Sig Start Date End Date Taking? Authorizing Provider  allopurinol (ZYLOPRIM) 300 MG tablet Take 1 tablet (300  mg total) by mouth daily. For gout 06/05/14   Sanjuana KavaAgnes I Nwoko, NP  ARIPiprazole (ABILIFY) 15 MG tablet Take 1 tablet (15 mg total) by mouth daily. For mood control 06/05/14   Sanjuana KavaAgnes I Nwoko, NP  atorvastatin (LIPITOR) 10 MG tablet Take 1 tablet (10 mg total) by mouth daily. For high cholesterol 06/05/14   Sanjuana KavaAgnes I Nwoko, NP  colchicine 0.6 MG tablet Take 1 tablet (0.6 mg total) by mouth 2 (two) times daily. For Gout pain 06/05/14   Sanjuana KavaAgnes I Nwoko, NP  divalproex (DEPAKOTE ER) 500 MG 24 hr tablet Take 2 tablets (1,000 mg total) by mouth 2 (two) times daily after a meal. For mood stabilization 06/05/14   Sanjuana KavaAgnes I Nwoko, NP  furosemide (LASIX) 40 MG tablet Take 1-2 tablets (40-80 mg total) by mouth 2 (two) times daily. Take 2 tablets in the morning and 1 tablet in the evening: For swellings 06/05/14   Sanjuana KavaAgnes I Nwoko, NP  glipiZIDE (GLUCOTROL XL) 10 MG 24 hr tablet Take 1 tablet (10 mg total) by mouth 2 (two) times daily. For diabetes 06/05/14   Sanjuana KavaAgnes I Nwoko, NP  hydrALAZINE (APRESOLINE) 50 MG tablet Take 1 tablet 3 times daily for HTN 06/05/14   Sanjuana KavaAgnes I Nwoko, NP  insulin glargine (LANTUS) 100 UNIT/ML injection Inject 0.15 mLs (15 Units total) into the skin at bedtime. For diabetes 06/05/14   Sanjuana KavaAgnes I Nwoko, NP  linagliptin (TRADJENTA) 5 MG TABS tablet Take 1 tablet (5 mg total) by mouth  daily. For diabetes 06/05/14   Sanjuana Kava, NP  LORazepam (ATIVAN) 0.5 MG tablet Take 1 tablet (0.5 mg total) by mouth 2 (two) times daily. For anxiety 06/05/14   Sanjuana Kava, NP  losartan (COZAAR) 100 MG tablet Take 1 tablet (100 mg total) by mouth daily. For high blood pressure 06/05/14   Sanjuana Kava, NP  metoprolol (TOPROL-XL) 200 MG 24 hr tablet Take 1 tablet (200 mg total) by mouth daily. For high blood pressure 06/05/14   Sanjuana Kava, NP  traZODone (DESYREL) 100 MG tablet Take 2 tablets (200 mg total) by mouth at bedtime. For sleep 06/05/14   Sanjuana Kava, NP   BP 111/55  Pulse 95  Temp(Src) 102.5 F (39.2 C) (Oral)  Resp 18  Ht 6'  (1.829 m)  Wt 277 lb (125.646 kg)  BMI 37.56 kg/m2  SpO2 98% Physical Exam  Nursing note and vitals reviewed. Constitutional: He is oriented to person, place, and time. He appears well-developed and well-nourished. No distress.  HENT:  Head: Normocephalic and atraumatic.  Eyes: Conjunctivae are normal. No scleral icterus.  Neck: Neck supple.  Cardiovascular: Normal rate and intact distal pulses.   Pulmonary/Chest: Effort normal. No stridor. No respiratory distress.  Abdominal: Normal appearance. He exhibits no distension.  Musculoskeletal:       Right ankle: He exhibits normal range of motion, no ecchymosis, no deformity and normal pulse. Tenderness.  Neurological: He is alert and oriented to person, place, and time.  Skin: Skin is warm and dry. No rash noted.  Psychiatric: He has a normal mood and affect. His behavior is normal. His speech is rapid and/or pressured. He expresses no suicidal ideation.    ED Course  Procedures (including critical care time) Labs Review Labs Reviewed  CBC WITH DIFFERENTIAL - Abnormal; Notable for the following:    Hemoglobin 12.6 (*)    HCT 38.7 (*)    Platelets 137 (*)    Neutrophils Relative % 79 (*)    Lymphocytes Relative 7 (*)    Lymphs Abs 0.5 (*)    Monocytes Relative 14 (*)    All other components within normal limits  COMPREHENSIVE METABOLIC PANEL - Abnormal; Notable for the following:    Glucose, Bld 221 (*)    BUN 30 (*)    Creatinine, Ser 1.84 (*)    Albumin 3.4 (*)    AST 47 (*)    GFR calc non Af Amer 38 (*)    GFR calc Af Amer 44 (*)    All other components within normal limits  URINALYSIS, ROUTINE W REFLEX MICROSCOPIC - Abnormal; Notable for the following:    APPearance CLOUDY (*)    Protein, ur 100 (*)    Leukocytes, UA SMALL (*)    All other components within normal limits  SALICYLATE LEVEL - Abnormal; Notable for the following:    Salicylate Lvl <2.0 (*)    All other components within normal limits  URINE  MICROSCOPIC-ADD ON - Abnormal; Notable for the following:    Bacteria, UA MANY (*)    All other components within normal limits  URINE RAPID DRUG SCREEN (HOSP PERFORMED)  ETHANOL  ACETAMINOPHEN LEVEL    Imaging Review Dg Ankle Complete Right  06/17/2014   CLINICAL DATA:  Diffuse right ankle swelling and pain, with limited range of motion.  EXAM: RIGHT ANKLE - COMPLETE 3+ VIEW  COMPARISON:  None.  FINDINGS: There is no evidence of fracture or dislocation. The ankle mortise is  intact; the interosseous space is within normal limits. No talar tilt or subluxation is seen. A plantar calcaneal spur is noted.  The joint spaces are preserved. Diffuse soft tissue swelling is noted, with scattered soft tissue calcifications.  IMPRESSION: No evidence of fracture or dislocation.   Electronically Signed   By: Roanna Raider M.D.   On: 06/17/2014 01:31  All radiology studies independently viewed by me.      EKG Interpretation None      MDM   Final diagnoses:  Bipolar affective disorder, current episode manic without psychotic symptoms, current episode severity unspecified    63 yo male with hx of Bipolar presenting with law enforcement because his family took out IVC paperwork.  Paperwork cited bizarre behavior, wandering, making deals with strangers.  Family also concerned about a missing pack of razor blades.  Pt denies SI currently.    He also complains of right ankle pain after he twisted it tonight.  Plain film pending.    Plan TTS consult.    Labs appear stable.  Kidney function not significantly increased above baseline.  Plain film negative.  Awaiting TTS rec's.    Candyce Churn III, MD 06/17/14 [phone]  Addendum: developed a fever during ED course.  Likely secondary to UTI.  Fever resolved and pt does not appear septic.  Treat with keflex, continue with plan for TTS eval.   Candyce Churn III, MD 06/17/14 [phone]

## 2014-06-18 ENCOUNTER — Encounter (HOSPITAL_COMMUNITY): Payer: Self-pay | Admitting: Psychiatry

## 2014-06-18 ENCOUNTER — Emergency Department (HOSPITAL_COMMUNITY): Payer: Medicare HMO

## 2014-06-18 DIAGNOSIS — F319 Bipolar disorder, unspecified: Secondary | ICD-10-CM

## 2014-06-18 LAB — URINALYSIS, ROUTINE W REFLEX MICROSCOPIC
Bilirubin Urine: NEGATIVE
Glucose, UA: NEGATIVE mg/dL
Hgb urine dipstick: NEGATIVE
Ketones, ur: NEGATIVE mg/dL
Nitrite: NEGATIVE
Protein, ur: 30 mg/dL — AB
Specific Gravity, Urine: 1.013 (ref 1.005–1.030)
Urobilinogen, UA: 1 mg/dL (ref 0.0–1.0)
pH: 6 (ref 5.0–8.0)

## 2014-06-18 LAB — COMPREHENSIVE METABOLIC PANEL
ALT: 31 U/L (ref 0–53)
AST: 28 U/L (ref 0–37)
Albumin: 2.8 g/dL — ABNORMAL LOW (ref 3.5–5.2)
Alkaline Phosphatase: 55 U/L (ref 39–117)
Anion gap: 9 (ref 5–15)
BUN: 24 mg/dL — ABNORMAL HIGH (ref 6–23)
CO2: 31 mEq/L (ref 19–32)
Calcium: 8.9 mg/dL (ref 8.4–10.5)
Chloride: 97 mEq/L (ref 96–112)
Creatinine, Ser: 1.51 mg/dL — ABNORMAL HIGH (ref 0.50–1.35)
GFR calc Af Amer: 56 mL/min — ABNORMAL LOW (ref >90)
GFR calc non Af Amer: 49 mL/min — ABNORMAL LOW (ref >90)
Glucose, Bld: 105 mg/dL — ABNORMAL HIGH (ref 70–99)
Potassium: 4.2 mEq/L (ref 3.7–5.3)
Sodium: 137 mEq/L (ref 137–147)
Total Bilirubin: 0.6 mg/dL (ref 0.3–1.2)
Total Protein: 6.4 g/dL (ref 6.0–8.3)

## 2014-06-18 LAB — CBC WITH DIFFERENTIAL/PLATELET
Basophils Absolute: 0 10*3/uL (ref 0.0–0.1)
Basophils Relative: 0 % (ref 0–1)
Eosinophils Absolute: 0 10*3/uL (ref 0.0–0.7)
Eosinophils Relative: 0 % (ref 0–5)
HCT: 35.3 % — ABNORMAL LOW (ref 39.0–52.0)
Hemoglobin: 11.7 g/dL — ABNORMAL LOW (ref 13.0–17.0)
Lymphocytes Relative: 10 % — ABNORMAL LOW (ref 12–46)
Lymphs Abs: 0.8 10*3/uL (ref 0.7–4.0)
MCH: 29.7 pg (ref 26.0–34.0)
MCHC: 33.1 g/dL (ref 30.0–36.0)
MCV: 89.6 fL (ref 78.0–100.0)
Monocytes Absolute: 1.2 10*3/uL — ABNORMAL HIGH (ref 0.1–1.0)
Monocytes Relative: 14 % — ABNORMAL HIGH (ref 3–12)
Neutro Abs: 6.1 10*3/uL (ref 1.7–7.7)
Neutrophils Relative %: 76 % (ref 43–77)
Platelets: 122 10*3/uL — ABNORMAL LOW (ref 150–400)
RBC: 3.94 MIL/uL — ABNORMAL LOW (ref 4.22–5.81)
RDW: 13.5 % (ref 11.5–15.5)
WBC: 8.1 10*3/uL (ref 4.0–10.5)

## 2014-06-18 LAB — CBG MONITORING, ED
Glucose-Capillary: 103 mg/dL — ABNORMAL HIGH (ref 70–99)
Glucose-Capillary: 116 mg/dL — ABNORMAL HIGH (ref 70–99)
Glucose-Capillary: 183 mg/dL — ABNORMAL HIGH (ref 70–99)
Glucose-Capillary: 46 mg/dL — ABNORMAL LOW (ref 70–99)
Glucose-Capillary: 80 mg/dL (ref 70–99)
Glucose-Capillary: 86 mg/dL (ref 70–99)
Glucose-Capillary: 103 mg/dL — ABNORMAL HIGH (ref 70–99)
Glucose-Capillary: 75 mg/dL (ref 70–99)
Glucose-Capillary: 35 mg/dL — CL (ref 70–99)
Glucose-Capillary: 126 mg/dL — ABNORMAL HIGH (ref 70–99)
Glucose-Capillary: 91 mg/dL (ref 70–99)
Glucose-Capillary: 152 mg/dL — ABNORMAL HIGH (ref 70–99)
Glucose-Capillary: 170 mg/dL — ABNORMAL HIGH (ref 70–99)
Glucose-Capillary: 170 mg/dL — ABNORMAL HIGH (ref 70–99)

## 2014-06-18 LAB — URINE MICROSCOPIC-ADD ON

## 2014-06-18 LAB — TSH: TSH: 0.979 u[IU]/mL (ref 0.350–4.500)

## 2014-06-18 LAB — LACTIC ACID, PLASMA: Lactic Acid, Venous: 0.7 mmol/L (ref 0.5–2.2)

## 2014-06-18 LAB — T4, FREE: Free T4: 0.79 ng/dL — ABNORMAL LOW (ref 0.80–1.80)

## 2014-06-18 MED ORDER — CEPHALEXIN 500 MG PO CAPS
500.0000 mg | ORAL_CAPSULE | Freq: Two times a day (BID) | ORAL | Status: DC
Start: 2014-06-18 — End: 2014-06-21

## 2014-06-18 MED ORDER — CEPHALEXIN 500 MG PO CAPS
500.0000 mg | ORAL_CAPSULE | Freq: Once | ORAL | Status: AC
  Administered 2014-06-18: 500 mg via ORAL
  Filled 2014-06-18: qty 1

## 2014-06-18 MED ORDER — IBUPROFEN 800 MG PO TABS
800.0000 mg | ORAL_TABLET | Freq: Once | ORAL | Status: AC
  Administered 2014-06-18: 800 mg via ORAL
  Filled 2014-06-18: qty 1

## 2014-06-18 MED ORDER — GUAIFENESIN ER 600 MG PO TB12
600.0000 mg | ORAL_TABLET | Freq: Two times a day (BID) | ORAL | Status: DC | PRN
  Filled 2014-06-18: qty 1

## 2014-06-18 MED ORDER — ACETAMINOPHEN 500 MG PO TABS
1000.0000 mg | ORAL_TABLET | Freq: Four times a day (QID) | ORAL | Status: DC | PRN
  Administered 2014-06-19: 1000 mg via ORAL
  Administered 2014-06-19: 650 mg via ORAL
  Administered 2014-06-20 – 2014-06-21 (×3): 1000 mg via ORAL
  Filled 2014-06-18 (×4): qty 2

## 2014-06-18 MED ORDER — CEPHALEXIN 500 MG PO CAPS
500.0000 mg | ORAL_CAPSULE | Freq: Two times a day (BID) | ORAL | Status: DC
  Administered 2014-06-18 – 2014-06-19 (×2): 500 mg via ORAL
  Filled 2014-06-18 (×3): qty 1

## 2014-06-18 NOTE — ED Notes (Signed)
Pt refusing to wear oxygen. Pt O2 drops to mid-high 80s when sleeping.

## 2014-06-18 NOTE — ED Notes (Signed)
Pt is holding down the call bell so that it can not be answered.

## 2014-06-18 NOTE — ED Notes (Signed)
Pt states he is feeling a lot better than before and says as soon as his legs get better he is leaving here.  Pt is alert and oriented in NAD

## 2014-06-18 NOTE — ED Notes (Signed)
[employer] came to serve IVC papers

## 2014-06-18 NOTE — ED Notes (Signed)
Pt sleeping, in no acute distress.

## 2014-06-18 NOTE — BH Assessment (Signed)
[facility] Assessment Progress Note Christian [name] at [place] confirmed receipt of IVC paperwork.  Pt is accepted per Christian [name], but they still do not have a confirmed bed or md yet, but should in the am.

## 2014-06-18 NOTE — ED Notes (Signed)
Pt waiting on TTS consult 

## 2014-06-18 NOTE — Consult Note (Signed)
Face to face evaluation and I agree with this note 

## 2014-06-18 NOTE — BH Assessment (Signed)
[facility] Assessment Progress Note  Spoke with Pt's psychiatrist Dr. Nolen Mu, who says that she agrees with IP admission.  She states that this is his first manic episode, that he is unstable and needs admission for further evaluation and stabilization.  Faxed IVC paperwork to [facility]. Dana at [facility] said that pt is accepted, but does not have an assigned bed yet.  Attempted to confirm that they received IVC paperwork multiple times, and no one answered.  Will continue to attempt to confirm.

## 2014-06-18 NOTE — ED Notes (Signed)
Pt will be consulted by the hospitalist in the am for fevers and diabetes.

## 2014-06-18 NOTE — ED Provider Notes (Signed)
7:01 AM Patient has had fever through yesterday and some to the middle the night.  He overall looks well.  He does not look toxic.  Repeat blood work is without significant abnormalities.  He was given 1 g of IV Rocephin.  He overall has no significant complaint.  He does still does complain of some left ankle pain.  Review the records demonstrate that an x-ray of his right ankle was performed on arrival but no images of his left ankle was ever performed.  Plain films of the left ankle obtained this evening and demonstrated no significant osseous abnormality.  As the patient looks so good I think we should continue to watch him here in the emergency department today while at work on psychiatric placement.  If he continues to spike fevers she will need medical admission until he defervesces.   Dg Chest 2 View  06/17/2014   CLINICAL DATA:  Cough and fever.  EXAM: CHEST  2 VIEW  COMPARISON:  CT chest 03/22/2014.  PA and lateral chest 03/06/2011.  FINDINGS: The lungs are clear. Heart size is normal. There is no pneumothorax or pleural effusion. Tortuous aorta is noted.  IMPRESSION: No acute disease.   Electronically Signed   By: Drusilla Kanner M.D.   On: 06/17/2014 10:02   Dg Ankle Complete Left  06/18/2014   CLINICAL DATA:  Increasing pain and swelling over the last few days. No acute injury.  EXAM: LEFT ANKLE COMPLETE - 3+ VIEW  COMPARISON:  Foot radiographs 05/16/2009.  FINDINGS: The mineralization and alignment are normal. There is no evidence of acute fracture or dislocation. There is degenerative spurring of both malleoli. The talar dome and tibial plafond appear normal. The subtalar joint is not well defined, but based on the prior studies, without suspected abnormality. There is calcaneal spurring. The Achilles tendon is somewhat ill-defined.  There is soft tissue edema throughout the lower leg with multiple soft tissue calcifications.  IMPRESSION: Diffuse prominence of the soft tissues with scatter  soft tissue calcifications suggesting connective tissue disorder or chronic venous stasis. Grossly stable degenerative spurring at the ankle. No acute osseous findings or evidence of bone destruction.   Electronically Signed   By: Roxy Horseman M.D.   On: 06/18/2014 02:04   Dg Ankle Complete Right  06/17/2014   CLINICAL DATA:  Diffuse right ankle swelling and pain, with limited range of motion.  EXAM: RIGHT ANKLE - COMPLETE 3+ VIEW  COMPARISON:  None.  FINDINGS: There is no evidence of fracture or dislocation. The ankle mortise is intact; the interosseous space is within normal limits. No talar tilt or subluxation is seen. A plantar calcaneal spur is noted.  The joint spaces are preserved. Diffuse soft tissue swelling is noted, with scattered soft tissue calcifications.  IMPRESSION: No evidence of fracture or dislocation.   Electronically Signed   By: Roanna Raider M.D.   On: 06/17/2014 01:31   Dg Chest Port 1 View  06/18/2014   CLINICAL DATA:  Fever.  EXAM: PORTABLE CHEST - 1 VIEW  COMPARISON:  06/17/2014 and 03/25/2011.  FINDINGS: 0006 hr. There are lower lung volumes with lordotic positioning. Allowing for this, the heart size and mediastinal contours are stable. There is mildly increased right basilar atelectasis. No consolidation, pleural effusion or edema identified. The osseous structures appear unchanged.  IMPRESSION: Lower lung volumes with resulting mild right basilar atelectasis. No evidence of pneumonia.   Electronically Signed   By: Roxy Horseman M.D.   On: 06/18/2014 00:35  Results for orders placed during the hospital encounter of 06/17/14  CBC WITH DIFFERENTIAL      Result Value Ref Range   WBC 6.9  4.0 - 10.5 K/uL   RBC 4.32  4.22 - 5.81 MIL/uL   Hemoglobin 12.6 (*) 13.0 - 17.0 g/dL   HCT 78.238.7 (*) 95.639.0 - 21.352.0 %   MCV 89.6  78.0 - 100.0 fL   MCH 29.2  26.0 - 34.0 pg   MCHC 32.6  30.0 - 36.0 g/dL   RDW 08.613.5  57.811.5 - 46.915.5 %   Platelets 137 (*) 150 - 400 K/uL   Neutrophils Relative  % 79 (*) 43 - 77 %   Neutro Abs 5.4  1.7 - 7.7 K/uL   Lymphocytes Relative 7 (*) 12 - 46 %   Lymphs Abs 0.5 (*) 0.7 - 4.0 K/uL   Monocytes Relative 14 (*) 3 - 12 %   Monocytes Absolute 0.9  0.1 - 1.0 K/uL   Eosinophils Relative 0  0 - 5 %   Eosinophils Absolute 0.0  0.0 - 0.7 K/uL   Basophils Relative 0  0 - 1 %   Basophils Absolute 0.0  0.0 - 0.1 K/uL  COMPREHENSIVE METABOLIC PANEL      Result Value Ref Range   Sodium 139  137 - 147 mEq/L   Potassium 4.2  3.7 - 5.3 mEq/L   Chloride 99  96 - 112 mEq/L   CO2 30  19 - 32 mEq/L   Glucose, Bld 221 (*) 70 - 99 mg/dL   BUN 30 (*) 6 - 23 mg/dL   Creatinine, Ser 6.291.84 (*) 0.50 - 1.35 mg/dL   Calcium 9.2  8.4 - 52.810.5 mg/dL   Total Protein 7.0  6.0 - 8.3 g/dL   Albumin 3.4 (*) 3.5 - 5.2 g/dL   AST 47 (*) 0 - 37 U/L   ALT 41  0 - 53 U/L   Alkaline Phosphatase 68  39 - 117 U/L   Total Bilirubin 0.4  0.3 - 1.2 mg/dL   GFR calc non Af Amer 38 (*) >90 mL/min   GFR calc Af Amer 44 (*) >90 mL/min   Anion gap 10  5 - 15  URINE RAPID DRUG SCREEN (HOSP PERFORMED)      Result Value Ref Range   Opiates NONE DETECTED  NONE DETECTED   Cocaine NONE DETECTED  NONE DETECTED   Benzodiazepines NONE DETECTED  NONE DETECTED   Amphetamines NONE DETECTED  NONE DETECTED   Tetrahydrocannabinol NONE DETECTED  NONE DETECTED   Barbiturates NONE DETECTED  NONE DETECTED  ETHANOL      Result Value Ref Range   Alcohol, Ethyl (B) <11  0 - 11 mg/dL  URINALYSIS, ROUTINE W REFLEX MICROSCOPIC      Result Value Ref Range   Color, Urine YELLOW  YELLOW   APPearance CLOUDY (*) CLEAR   Specific Gravity, Urine 1.014  1.005 - 1.030   pH 5.5  5.0 - 8.0   Glucose, UA NEGATIVE  NEGATIVE mg/dL   Hgb urine dipstick NEGATIVE  NEGATIVE   Bilirubin Urine NEGATIVE  NEGATIVE   Ketones, ur NEGATIVE  NEGATIVE mg/dL   Protein, ur 413100 (*) NEGATIVE mg/dL   Urobilinogen, UA 1.0  0.0 - 1.0 mg/dL   Nitrite NEGATIVE  NEGATIVE   Leukocytes, UA SMALL (*) NEGATIVE  ACETAMINOPHEN LEVEL       Result Value Ref Range   Acetaminophen (Tylenol), Serum <15.0  10 - 30 ug/mL  SALICYLATE  LEVEL      Result Value Ref Range   Salicylate Lvl <2.0 (*) 2.8 - 20.0 mg/dL  URINE MICROSCOPIC-ADD ON      Result Value Ref Range   Squamous Epithelial / LPF RARE  RARE   WBC, UA 11-20  <3 WBC/hpf   Bacteria, UA MANY (*) RARE  VALPROIC ACID LEVEL      Result Value Ref Range   Valproic Acid Lvl 40.8 (*) 50.0 - 100.0 ug/mL  COMPREHENSIVE METABOLIC PANEL      Result Value Ref Range   Sodium 137  137 - 147 mEq/L   Potassium 4.2  3.7 - 5.3 mEq/L   Chloride 97  96 - 112 mEq/L   CO2 31  19 - 32 mEq/L   Glucose, Bld 105 (*) 70 - 99 mg/dL   BUN 24 (*) 6 - 23 mg/dL   Creatinine, Ser 0.45 (*) 0.50 - 1.35 mg/dL   Calcium 8.9  8.4 - 40.9 mg/dL   Total Protein 6.4  6.0 - 8.3 g/dL   Albumin 2.8 (*) 3.5 - 5.2 g/dL   AST 28  0 - 37 U/L   ALT 31  0 - 53 U/L   Alkaline Phosphatase 55  39 - 117 U/L   Total Bilirubin 0.6  0.3 - 1.2 mg/dL   GFR calc non Af Amer 49 (*) >90 mL/min   GFR calc Af Amer 56 (*) >90 mL/min   Anion gap 9  5 - 15  CBC WITH DIFFERENTIAL      Result Value Ref Range   WBC 8.1  4.0 - 10.5 K/uL   RBC 3.94 (*) 4.22 - 5.81 MIL/uL   Hemoglobin 11.7 (*) 13.0 - 17.0 g/dL   HCT 81.1 (*) 91.4 - 78.2 %   MCV 89.6  78.0 - 100.0 fL   MCH 29.7  26.0 - 34.0 pg   MCHC 33.1  30.0 - 36.0 g/dL   RDW 95.6  21.3 - 08.6 %   Platelets 122 (*) 150 - 400 K/uL   Neutrophils Relative % 76  43 - 77 %   Neutro Abs 6.1  1.7 - 7.7 K/uL   Lymphocytes Relative 10 (*) 12 - 46 %   Lymphs Abs 0.8  0.7 - 4.0 K/uL   Monocytes Relative 14 (*) 3 - 12 %   Monocytes Absolute 1.2 (*) 0.1 - 1.0 K/uL   Eosinophils Relative 0  0 - 5 %   Eosinophils Absolute 0.0  0.0 - 0.7 K/uL   Basophils Relative 0  0 - 1 %   Basophils Absolute 0.0  0.0 - 0.1 K/uL  LACTIC ACID, PLASMA      Result Value Ref Range   Lactic Acid, Venous 0.7  0.5 - 2.2 mmol/L  CBG MONITORING, ED      Result Value Ref Range   Glucose-Capillary 69 (*)  70 - 99 mg/dL  CBG MONITORING, ED      Result Value Ref Range   Glucose-Capillary 133 (*) 70 - 99 mg/dL  CBG MONITORING, ED      Result Value Ref Range   Glucose-Capillary 94  70 - 99 mg/dL  CBG MONITORING, ED      Result Value Ref Range   Glucose-Capillary 71  70 - 99 mg/dL  CBG MONITORING, ED      Result Value Ref Range   Glucose-Capillary 46 (*) 70 - 99 mg/dL  CBG MONITORING, ED      Result Value Ref Range  Glucose-Capillary 86  70 - 99 mg/dL  CBG MONITORING, ED      Result Value Ref Range   Glucose-Capillary 80  70 - 99 mg/dL  CBG MONITORING, ED      Result Value Ref Range   Glucose-Capillary 183 (*) 70 - 99 mg/dL  CBG MONITORING, ED      Result Value Ref Range   Glucose-Capillary 116 (*) 70 - 99 mg/dL  CBG MONITORING, ED      Result Value Ref Range   Glucose-Capillary 103 (*) 70 - 99 mg/dL  CBG MONITORING, ED      Result Value Ref Range   Glucose-Capillary 103 (*) 70 - 99 mg/dL     Lyanne Co, MD 06/18/14 [phone]

## 2014-06-18 NOTE — ED Provider Notes (Addendum)
7:34 AM  Assumed care from Dr. Patria Mane.  Pt is a 63 y.o. M with h/o diabetes, gout who presents to ED IVC'ed by his wife for psychotic behavior.  Pt has been evaluated by psych who are working on referral for inpt treatment.  Pt also has UTI and despite oral abx yesterday, pt continued to have fever.  Given Rocephin yesterday and labs rechecked.  He has mild CKD that is stable.  No leukocytosis.  Normal lactate.  VS otherwise are normal and pt is well appearing.  Plan is to continue to monitor while pysch is working on placement.  If recurrent fevers, plan is for medical admission.   8:55 AM  On re-evaluation, patient is very well-appearing, pleasant, smiling and laughing. He states he has had some nasal congestion and cough over the past several days. He is requesting something to help with his chest congestion. Chest x-ray shows no obvious infiltrate. No hypoxia. Lungs are clear to auscultation. No sign of septic arthritis, cellulitis on exam. No recent tick bites. No headache, neck pain or neck stiffness. No abdominal pain. No vomiting or diarrhea. He is still afebrile, hemodynamically stable. Serum glucose has been well-controlled.   12:40 PM  Pt is still doing well and is very well-appearing. His urine culture did grow gram-negative rods but there is no sensitivity back yet. He is currently on Keflex. He has been accepted to [facility] for psychiatric treatment. Given he has not had another fever and no more than 12 hours and has not had any antipyretics since last night, I feel he is medically clear and can go to  [facility] for psychiatric treatment.  Layla Maw Ward, DO 06/18/14 1242  5:00 PM  Pt has a fever of 101.1. He does have cough and congestion and may have a viral URI as well as a urinary tract infection. Given he is still so well-appearing and has only had 24 hours of treatment, I do not feel this time he needs medical admission for his UTI. Urine culture and blood culture is pending.  Discuss with psychiatry who will check with [facility] to see if this will keep him from being admitted. They state he is not planning to go there until tomorrow morning. I feel he can continue to be watched in the emergency department.  Layla Maw Ward, DO 06/18/14 1705

## 2014-06-18 NOTE — BH Assessment (Addendum)
Accepted to [facility]), per Annabelle Harman. However, no beds available today but may have a bed for this patient tomorow. Annabelle Harman has asked  TTS to fax IVC paperwork to #[phone]. Spoke to TTS staff Bernette Redbird. regarding disposition updates. Dana's contact # is [phone].
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
## 2014-06-18 NOTE — [facility] Suicide Risk Assessment (Deleted)
Suicide Risk Assessment  Discharge Assessment     Demographic Factors:  Male  Total Time spent with patient: 20 minutes  Psychiatric Specialty Exam:     Blood pressure 134/81, pulse 82, temperature 99 F (37.2 C), temperature source Oral, resp. rate 20, height 6' (1.829 m), weight 277 lb (125.646 kg), SpO2 100.00%.Body mass index is 37.56 kg/(m^2).  General Appearance: Casual  Eye Contact::  Good  Speech:  Normal Rate  Volume:  Normal  Mood:  Euthymic  Affect:  Congruent  Thought Process:  Coherent  Orientation:  Full (Time, Place, and Person)  Thought Content:  WDL  Suicidal Thoughts:  No  Homicidal Thoughts:  No  Memory:  Immediate;   Good Recent;   Good Remote;   Good  Judgement:  Fair  Insight:  Fair  Psychomotor Activity:  Normal  Concentration:  Good  Recall:  Good  Fund of Knowledge:Good  Language: Good  Akathisia:  No  Handed:  Right  AIMS (if indicated):     Assets:  Financial Resources/Insurance Housing Intimacy Leisure Time Resilience Social Support Transportation  Sleep:       Musculoskeletal: Strength & Muscle Tone: within normal limits Gait & Station: normal Patient leans: N/A   Mental Status Per Nursing Assessment::   On Admission:   Psychosis  Current Mental Status by Physician: NA  Loss Factors: NA  Historical Factors: NA  Risk Reduction Factors:   Positive coping skills, positive therapeutic relationship, responsibility to family  Continued Clinical Symptoms:  None  Cognitive Features That Contribute To Risk:  None   Suicide Risk:  Minimal: No identifiable suicidal ideation.  Patients presenting with no risk factors but with morbid ruminations; may be classified as minimal risk based on the severity of the depressive symptoms  Discharge Diagnoses:   AXIS I:  Bipolar disorder AXIS II:  Deferred AXIS III:   Past Medical History  Diagnosis Date  . Gout   . Hypertension   . Depression   . Mood swings   . Type II or  unspecified type diabetes mellitus with unspecified complication, uncontrolled 06/02/2014  . Other and unspecified hyperlipidemia 06/02/2014  . OSA on CPAP 06/02/2014  . Bilateral lower extremity edema: chronic with venous stasis changes 06/02/2014   AXIS IV:  other psychosocial or environmental problems, problems related to social environment and problems with primary support group AXIS V:  61-70 mild symptoms  Plan Of Care/Follow-up recommendations:  Activity:  as tolerated Diet:  lows-sodium heart healthy diet  Is patient on multiple antipsychotic therapies at discharge:  No   Has Patient had three or more failed trials of antipsychotic monotherapy by history:  No  Recommended Plan for Multiple Antipsychotic Therapies: NA    LORD, JAMISON, PMH-NP 06/18/2014, 1:25 PM

## 2014-06-18 NOTE — ED Notes (Signed)
Patient states he does not have to urinate but will try later

## 2014-06-19 ENCOUNTER — Encounter (HOSPITAL_COMMUNITY): Payer: Self-pay | Admitting: Emergency Medicine

## 2014-06-19 DIAGNOSIS — N390 Urinary tract infection, site not specified: Secondary | ICD-10-CM | POA: Diagnosis present

## 2014-06-19 DIAGNOSIS — I5033 Acute on chronic diastolic (congestive) heart failure: Secondary | ICD-10-CM | POA: Diagnosis not present

## 2014-06-19 DIAGNOSIS — Z79899 Other long term (current) drug therapy: Secondary | ICD-10-CM | POA: Diagnosis not present

## 2014-06-19 DIAGNOSIS — N179 Acute kidney failure, unspecified: Secondary | ICD-10-CM | POA: Diagnosis not present

## 2014-06-19 DIAGNOSIS — F312 Bipolar disorder, current episode manic severe with psychotic features: Secondary | ICD-10-CM | POA: Diagnosis not present

## 2014-06-19 DIAGNOSIS — I872 Venous insufficiency (chronic) (peripheral): Secondary | ICD-10-CM | POA: Diagnosis present

## 2014-06-19 DIAGNOSIS — G4733 Obstructive sleep apnea (adult) (pediatric): Secondary | ICD-10-CM | POA: Diagnosis present

## 2014-06-19 DIAGNOSIS — I369 Nonrheumatic tricuspid valve disorder, unspecified: Secondary | ICD-10-CM | POA: Diagnosis not present

## 2014-06-19 DIAGNOSIS — M109 Gout, unspecified: Secondary | ICD-10-CM | POA: Diagnosis present

## 2014-06-19 DIAGNOSIS — N3000 Acute cystitis without hematuria: Secondary | ICD-10-CM | POA: Diagnosis present

## 2014-06-19 DIAGNOSIS — I10 Essential (primary) hypertension: Secondary | ICD-10-CM | POA: Diagnosis present

## 2014-06-19 DIAGNOSIS — F3110 Bipolar disorder, current episode manic without psychotic features, unspecified: Secondary | ICD-10-CM | POA: Diagnosis present

## 2014-06-19 DIAGNOSIS — F22 Delusional disorders: Secondary | ICD-10-CM | POA: Diagnosis present

## 2014-06-19 DIAGNOSIS — E119 Type 2 diabetes mellitus without complications: Secondary | ICD-10-CM | POA: Diagnosis present

## 2014-06-19 DIAGNOSIS — R7881 Bacteremia: Secondary | ICD-10-CM | POA: Diagnosis present

## 2014-06-19 DIAGNOSIS — E785 Hyperlipidemia, unspecified: Secondary | ICD-10-CM | POA: Diagnosis present

## 2014-06-19 LAB — CBC WITH DIFFERENTIAL/PLATELET
Basophils Absolute: 0 10*3/uL (ref 0.0–0.1)
Basophils Relative: 0 % (ref 0–1)
Eosinophils Absolute: 0 10*3/uL (ref 0.0–0.7)
Eosinophils Relative: 0 % (ref 0–5)
HCT: 35.8 % — ABNORMAL LOW (ref 39.0–52.0)
Hemoglobin: 11.9 g/dL — ABNORMAL LOW (ref 13.0–17.0)
Lymphocytes Relative: 8 % — ABNORMAL LOW (ref 12–46)
Lymphs Abs: 0.7 10*3/uL (ref 0.7–4.0)
MCH: 29.5 pg (ref 26.0–34.0)
MCHC: 33.2 g/dL (ref 30.0–36.0)
MCV: 88.6 fL (ref 78.0–100.0)
Monocytes Absolute: 0.9 10*3/uL (ref 0.1–1.0)
Monocytes Relative: 11 % (ref 3–12)
Neutro Abs: 6.7 10*3/uL (ref 1.7–7.7)
Neutrophils Relative %: 81 % — ABNORMAL HIGH (ref 43–77)
Platelets: 129 10*3/uL — ABNORMAL LOW (ref 150–400)
RBC: 4.04 MIL/uL — ABNORMAL LOW (ref 4.22–5.81)
RDW: 13.2 % (ref 11.5–15.5)
WBC: 8.3 10*3/uL (ref 4.0–10.5)

## 2014-06-19 LAB — URINE CULTURE: Colony Count: >=100000

## 2014-06-19 LAB — CREATININE, SERUM
Creatinine, Ser: 1.46 mg/dL — ABNORMAL HIGH (ref 0.50–1.35)
GFR calc Af Amer: 59 mL/min — ABNORMAL LOW (ref >90)
GFR calc non Af Amer: 51 mL/min — ABNORMAL LOW (ref >90)

## 2014-06-19 LAB — CBG MONITORING, ED
Glucose-Capillary: 152 mg/dL — ABNORMAL HIGH (ref 70–99)
Glucose-Capillary: 157 mg/dL — ABNORMAL HIGH (ref 70–99)

## 2014-06-19 LAB — GLUCOSE, CAPILLARY: Glucose-Capillary: 251 mg/dL — ABNORMAL HIGH (ref 70–99)

## 2014-06-19 MED ORDER — HEPARIN SODIUM (PORCINE) 5000 UNIT/ML IJ SOLN
5000.0000 [IU] | Freq: Three times a day (TID) | INTRAMUSCULAR | Status: DC
Start: 2014-06-19 — End: 2014-06-21
  Administered 2014-06-19 – 2014-06-21 (×6): 5000 [IU] via SUBCUTANEOUS
  Filled 2014-06-19 (×9): qty 1

## 2014-06-19 MED ORDER — FUROSEMIDE 40 MG PO TABS
40.0000 mg | ORAL_TABLET | Freq: Every day | ORAL | Status: DC
  Administered 2014-06-19 – 2014-06-20 (×2): 40 mg via ORAL
  Filled 2014-06-19 (×3): qty 1

## 2014-06-19 MED ORDER — FUROSEMIDE 80 MG PO TABS
80.0000 mg | ORAL_TABLET | Freq: Every day | ORAL | Status: DC
  Administered 2014-06-20 – 2014-06-21 (×2): 80 mg via ORAL
  Filled 2014-06-19 (×2): qty 1

## 2014-06-19 MED ORDER — DEXTROSE 5 % IV SOLN
1.0000 g | Freq: Once | INTRAVENOUS | Status: AC
  Administered 2014-06-19: 1 g via INTRAVENOUS
  Filled 2014-06-19: qty 10

## 2014-06-19 MED ORDER — VANCOMYCIN HCL 10 G IV SOLR
1750.0000 mg | INTRAVENOUS | Status: DC
  Administered 2014-06-20: 1750 mg via INTRAVENOUS
  Filled 2014-06-19: qty 1750

## 2014-06-19 MED ORDER — VANCOMYCIN HCL 10 G IV SOLR
1750.0000 mg | Freq: Once | INTRAVENOUS | Status: AC
  Administered 2014-06-19: 1750 mg via INTRAVENOUS
  Filled 2014-06-19: qty 1750

## 2014-06-19 NOTE — ED Notes (Signed)
He is very busy in his demeanor and is a bit delusional (grandeur); offering everyone exorbitant amounts of money, raises, etc.  He at one time was loudly carrying on a rather heated discussion with his "wife" who actually isn't here with him.  As I write this, he has just been taken to the b.r. By our tech. Without incident.

## 2014-06-19 NOTE — ED Notes (Signed)
He has been seen by psychiatry; and has been taking his meds and eating/drinking without difficulty.  He told me the Tylenol given earlier was sufficient for his pain needs.

## 2014-06-19 NOTE — ED Notes (Signed)
Informed Dr. Ladona Ridgel that patient will be admitted

## 2014-06-19 NOTE — ED Notes (Signed)
Report phoned to [place], RN on [address]; and we'll transport shortly.

## 2014-06-19 NOTE — ED Notes (Signed)
Pt is using call bell repeatedly to call out without any particular needs.

## 2014-06-19 NOTE — ED Notes (Signed)
He is altogether more calm now, however he remains animated and is fixated on "writing a book" and he manages several pages of writings, with paper and pen we have supplied him.  His wife is just in for a visit.  He is happy to hear he is to be admitted.  He remains cooperative.

## 2014-06-19 NOTE — ED Notes (Signed)
Pt covered up and wants to take a nap before breakfast

## 2014-06-19 NOTE — ED Notes (Signed)
Pt says he is going to take a nap.

## 2014-06-19 NOTE — ED Provider Notes (Signed)
Patients here in the emergency room for psychotic behavior. He has been evaluated and were attempting to find inpatient placement. The patient has had fevers up to 101 yesterday. He has a urine culture that's positive for gram-negative rods. Patient was started on Keflex. This morning the patient has no complaints other than soreness in his leg from his gout. He denies fevers or chills or any other complaints Physical Exam  BP 127/62  Pulse 86  Temp(Src) 100.3 F (37.9 C) (Oral)  Resp 18  Ht 6' (1.829 m)  Wt 277 lb (125.646 kg)  BMI 37.56 kg/m2  SpO2 96%  Physical Exam  Nursing note and vitals reviewed. Constitutional: He appears well-developed and well-nourished. No distress.  HENT:  Head: Normocephalic and atraumatic.  Right Ear: External ear normal.  Left Ear: External ear normal.  Eyes: Conjunctivae are normal. Right eye exhibits no discharge. Left eye exhibits no discharge. No scleral icterus.  Neck: Neck supple. No tracheal deviation present.  Cardiovascular: Normal rate.   Pulmonary/Chest: Effort normal. No stridor. No respiratory distress.  Musculoskeletal: He exhibits no edema.  Tenderness to palpation of the right big toe, ankle and foot,; no erythema, no increased warmth  Neurological: He is alert. Cranial nerve deficit: no gross deficits.  Skin: Skin is warm and dry. No rash noted.  Psychiatric: He has a normal mood and affect.    ED Course  Procedures  MDM Pt appears well.  Low grade temp to 100.3.  Tolerating oral abx.  Will recheck CBC.  Does not appear septic or ill.     1113  Pt's CBC is normal this am.  Just called from lab however that blood culture from 7/20 (opne aerobic bottle) showing gram positive cocci in clusters.  Could be a contaminant however Will consult with medical service considering his confirmed uti, multiple medical problems.  May not be a good candidate for psychiatric admission at this time.  Linwood Dibbles, MD 06/19/14 1115

## 2014-06-19 NOTE — H&P (Signed)
[facility] History and Physical  Etai Copado Fross ZOX:[id number] DOB: 25-Mar-1951 DOA: 06/17/2014  Referring physician: Dr. Roselyn Bering PCP: Leanor Rubenstein, MD   Chief Complaint: Positive blood culture  HPI: Christian [name] is a 63 y.o. male  Currently awaiting placement a psychiatric hospital for mania. While awaiting placement in the emergency department was found to have urinary tract infection and one blood culture came back positive. As a result we were consulted for further evaluation and recommendations. Patient's only complaint currently is his gout which is causing his right foot discomfort.   Review of Systems:  Unable to accurately assess patient tangential   Past Medical History  Diagnosis Date  . Gout   . Hypertension   . Depression   . Mood swings   . Type II or unspecified type diabetes mellitus with unspecified complication, uncontrolled 06/02/2014  . Other and unspecified hyperlipidemia 06/02/2014  . OSA on CPAP 06/02/2014  . Bilateral lower extremity edema: chronic with venous stasis changes 06/02/2014   History reviewed. No pertinent past surgical history. Social History:  reports that he has never smoked. He has never used smokeless tobacco. He reports that he does not drink alcohol or use illicit drugs.  No Known Allergies  History reviewed. No pertinent family history.   Prior to Admission medications   Medication Sig Start Date End Date Taking? Authorizing Provider  allopurinol (ZYLOPRIM) 300 MG tablet Take 1 tablet (300 mg total) by mouth daily. For gout 06/05/14  Yes Sanjuana Kava, NP  atorvastatin (LIPITOR) 10 MG tablet Take 1 tablet (10 mg total) by mouth daily. For high cholesterol 06/05/14  Yes Sanjuana Kava, NP  colchicine 0.6 MG tablet Take 1 tablet (0.6 mg total) by mouth 2 (two) times daily. For Gout pain 06/05/14  Yes Sanjuana Kava, NP  furosemide (LASIX) 40 MG tablet Take 1-2 tablets (40-80 mg total) by mouth 2 (two) times daily. Take 2 tablets in the  morning and 1 tablet in the evening: For swellings 06/05/14  Yes Sanjuana Kava, NP  glipiZIDE (GLUCOTROL XL) 10 MG 24 hr tablet Take 1 tablet (10 mg total) by mouth 2 (two) times daily. For diabetes 06/05/14  Yes Sanjuana Kava, NP  hydrALAZINE (APRESOLINE) 50 MG tablet Take 1 tablet 3 times daily for HTN 06/05/14  Yes Sanjuana Kava, NP  LORazepam (ATIVAN) 0.5 MG tablet Take 0.5 mg by mouth 2 (two) times daily. For anxiety 06/05/14  Yes Sanjuana Kava, NP  losartan (COZAAR) 100 MG tablet Take 1 tablet (100 mg total) by mouth daily. For high blood pressure 06/05/14  Yes Sanjuana Kava, NP  metoprolol (TOPROL-XL) 200 MG 24 hr tablet Take 1 tablet (200 mg total) by mouth daily. For high blood pressure 06/05/14  Yes Sanjuana Kava, NP  traZODone (DESYREL) 100 MG tablet Take 2 tablets (200 mg total) by mouth at bedtime. For sleep 06/05/14  Yes Sanjuana Kava, NP  ARIPiprazole (ABILIFY) 15 MG tablet Take 1 tablet (15 mg total) by mouth daily. For mood control 06/05/14   Sanjuana Kava, NP  cephALEXin (KEFLEX) 500 MG capsule Take 1 capsule (500 mg total) by mouth 2 (two) times daily. 06/18/14   Kristen N Ward, DO  divalproex (DEPAKOTE ER) 500 MG 24 hr tablet Take 2 tablets (1,000 mg total) by mouth 2 (two) times daily after a meal. For mood stabilization 06/05/14   Sanjuana Kava, NP   Physical Exam: Filed Vitals:   06/19/14 0441  06/19/14 0757 06/19/14 1134 06/19/14 1300  BP: 138/71 127/62 138/88 183/73  Pulse: 90 86 78 81  Temp: 98.4 F (36.9 C) 100.3 F (37.9 C) 99.8 F (37.7 C) 98.3 F (36.8 C)  TempSrc: Oral Oral Oral Oral  Resp: 18 18 19 20   Height:    6' (1.829 m)  Weight:    122.9 kg (270 lb 15.1 oz)  SpO2: 96% 96% 99% 100%    Wt Readings from Last 3 Encounters:  06/19/14 122.9 kg (270 lb 15.1 oz)  05/25/14 127.914 kg (282 lb)    General:  Appears calm and comfortable Eyes: PERRL, normal lids, irises & conjunctiva ENT: grossly normal hearing, lips & tongue Neck: no LAD, masses or  thyromegaly Cardiovascular: RRR, no m/r/g. + LE edema. Respiratory: CTA bilaterally, no w/r/r. Normal respiratory effort. Abdomen: soft, ntnd Skin: no rash or induration seen on limited exam, lower extremity edema Musculoskeletal: Pain with movement her right Psychiatric: Pressured speech, tangential thinking Neurologic: No facial asymmetry, moves all extremities           Labs on Admission:  Basic Metabolic Panel:  Recent Labs Lab 06/17/14 0130 06/17/14 2338  NA 139 137  K 4.2 4.2  CL 99 97  CO2 30 31  GLUCOSE 221* 105*  BUN 30* 24*  CREATININE 1.84* 1.51*  CALCIUM 9.2 8.9   Liver Function Tests:  Recent Labs Lab 06/17/14 0130 06/17/14 2338  AST 47* 28  ALT 41 31  ALKPHOS 68 55  BILITOT 0.4 0.6  PROT 7.0 6.4  ALBUMIN 3.4* 2.8*   No results found for this basename: LIPASE, AMYLASE,  in the last 168 hours No results found for this basename: AMMONIA,  in the last 168 hours CBC:  Recent Labs Lab 06/17/14 0130 06/17/14 2331 06/19/14 0952  WBC 6.9 8.1 8.3  NEUTROABS 5.4 6.1 6.7  HGB 12.6* 11.7* 11.9*  HCT 38.7* 35.3* 35.8*  MCV 89.6 89.6 88.6  PLT 137* 122* 129*   Cardiac Enzymes: No results found for this basename: CKTOTAL, CKMB, CKMBINDEX, TROPONINI,  in the last 168 hours  BNP (last 3 results) No results found for this basename: PROBNP,  in the last 8760 hours CBG:  Recent Labs Lab 06/18/14 1604 06/18/14 1700 06/18/14 2202 06/19/14 0726 06/19/14 1134  GLUCAP 152* 170* 170* 152* 157*    Radiological Exams on Admission: Dg Ankle Complete Left  06/18/2014   CLINICAL DATA:  Increasing pain and swelling over the last few days. No acute injury.  EXAM: LEFT ANKLE COMPLETE - 3+ VIEW  COMPARISON:  Foot radiographs 05/16/2009.  FINDINGS: The mineralization and alignment are normal. There is no evidence of acute fracture or dislocation. There is degenerative spurring of both malleoli. The talar dome and tibial plafond appear normal. The subtalar joint is  not well defined, but based on the prior studies, without suspected abnormality. There is calcaneal spurring. The Achilles tendon is somewhat ill-defined.  There is soft tissue edema throughout the lower leg with multiple soft tissue calcifications.  IMPRESSION: Diffuse prominence of the soft tissues with scatter soft tissue calcifications suggesting connective tissue disorder or chronic venous stasis. Grossly stable degenerative spurring at the ankle. No acute osseous findings or evidence of bone destruction.   Electronically Signed   By: Roxy HorsemanBill  Veazey M.D.   On: 06/18/2014 02:04   Dg Chest Port 1 View  06/18/2014   CLINICAL DATA:  Fever.  EXAM: PORTABLE CHEST - 1 VIEW  COMPARISON:  06/17/2014 and 03/25/2011.  FINDINGS: 0006  hr. There are lower lung volumes with lordotic positioning. Allowing for this, the heart size and mediastinal contours are stable. There is mildly increased right basilar atelectasis. No consolidation, pleural effusion or edema identified. The osseous structures appear unchanged.  IMPRESSION: Lower lung volumes with resulting mild right basilar atelectasis. No evidence of pneumonia.   Electronically Signed   By: Roxy Horseman M.D.   On: 06/18/2014 00:35     Assessment/Plan Principal Problem:   Bipolar affective disorder, current episode manic with psychotic symptoms - Psychiatry managing  Active Problems:   UTI (urinary tract infection) - Rocephin on board. Urine culture reviewed - Consider transitioning to oral regimen within the next 1-2 days    Positive blood culture - Could represent a contaminant. We'll repeat blood cultures but while waiting results will place on vancomycin. We'll await sensitivity results of the first positive blood culture.    Code Status: full DVT Prophylaxis: Heparin Family Communication: none at bedside Disposition Plan: Inpatient: [facility]  Time spent: > 55  Penny Pia [facility] Pager [phone]  **Disclaimer: This note may  have been dictated with voice recognition software. Similar sounding words can inadvertently be transcribed and this note may contain transcription errors which may not have been corrected upon publication of note.**

## 2014-06-19 NOTE — ED Notes (Signed)
Sitter has been at bedside since 2300 ,  Pt is cooperative,  More companion and comfort for pt.  Pt in NAD

## 2014-06-19 NOTE — ED Notes (Signed)
CBG 157 

## 2014-06-19 NOTE — Progress Notes (Signed)
ANTIBIOTIC CONSULT NOTE - INITIAL  Pharmacy Consult for Vancomycin Indication: bacteremia  No Known Allergies  Patient Measurements: Height: 6' (182.9 cm) Weight: 277 lb (125.646 kg) IBW/kg (Calculated) : 77.6  Vital Signs: Temp: 99.8 F (37.7 C) (07/22 1134) Temp src: Oral (07/22 1134) BP: 138/88 mmHg (07/22 1134) Pulse Rate: 78 (07/22 1134) Intake/Output from previous day:   Intake/Output from this shift:    Labs:  Recent Labs  06/17/14 0130 06/17/14 2331 06/17/14 2338 06/19/14 0952  WBC 6.9 8.1  --  8.3  HGB 12.6* 11.7*  --  11.9*  PLT 137* 122*  --  129*  CREATININE 1.84*  --  1.51*  --    Estimated Creatinine Clearance: 71.2 ml/min (by C-G formula based on Cr of 1.51). No results found for this basename: VANCOTROUGH, Leodis BinetVANCOPEAK, VANCORANDOM, GENTTROUGH, GENTPEAK, GENTRANDOM, TOBRATROUGH, TOBRAPEAK, TOBRARND, AMIKACINPEAK, AMIKACINTROU, AMIKACIN,  in the last 72 hours   Microbiology: Recent Results (from the past 720 hour(s))  URINE CULTURE     Status: None   Collection Time    06/17/14  5:17 AM      Result Value Ref Range Status   Specimen Description URINE, CLEAN CATCH   Final   Special Requests NONE   Final   Culture  Setup Time     Final   Value: 06/17/2014 09:13     [facility]formed at [facility]   Colony Count     Final   Value: >=100,000 COLONIES/ML     [facility]formed at [facility]   Culture     Final   Value: GRAM NEGATIVE RODS     [facility]formed at [facility]   Report Status PENDING   Incomplete  CULTURE, BLOOD (ROUTINE X 2)     Status: None   Collection Time    06/17/14 11:36 PM      Result Value Ref Range Status   Specimen Description BLOOD R HAND   Final   Special Requests BOTTLES DRAWN AEROBIC AND ANAEROBIC 5 MLS EACH   Final   Culture  Setup Time     Final   Value: 06/18/2014 04:05     [facility]formed at [facility]   Culture     Final   Value:        BLOOD CULTURE RECEIVED NO GROWTH TO DATE CULTURE WILL BE HELD FOR 5  DAYS BEFORE ISSUING A FINAL NEGATIVE REPORT     Performed at [facility]   Report Status PENDING   Incomplete  CULTURE, BLOOD (ROUTINE X 2)     Status: None   Collection Time    06/17/14 11:38 PM      Result Value Ref Range Status   Specimen Description BLOOD RIGHT ANTECUBITAL   Final   Special Requests BOTTLES DRAWN AEROBIC AND ANAEROBIC 6ML EACH   Final   Culture  Setup Time     Final   Value: 06/18/2014 04:04     Performed at [facility]   Culture     Final   Value: GRAM POSITIVE COCCI IN CLUSTERS     Note: Gram Stain Report Called to,Read Back By and Verified With: American Family InsuranceSTACEY WEST RN 10:45AM 06/19/14 BY MILSH     Performed at [facility]   Report Status PENDING   Incomplete    Medical History: Past Medical History  Diagnosis Date  . Gout   . Hypertension   . Depression   . Mood swings   . Type II or  unspecified type diabetes mellitus with unspecified complication, uncontrolled 06/02/2014  . Other and unspecified hyperlipidemia 06/02/2014  . OSA on CPAP 06/02/2014  . Bilateral lower extremity edema: chronic with venous stasis changes 06/02/2014    Medications:  Scheduled:  . allopurinol  300 mg Oral Daily  . ARIPiprazole  15 mg Oral QHS  . atorvastatin  10 mg Oral Daily  . cephALEXin  500 mg Oral Q12H  . colchicine  0.6 mg Oral BID  . divalproex  1,000 mg Oral BID PC  . furosemide  40-80 mg Oral BID  . hydrALAZINE  50 mg Oral 3 times per day  . insulin aspart  0-15 Units Subcutaneous TID WC  . insulin glargine  15 Units Subcutaneous QHS  . LORazepam  0.5 mg Oral BID  . losartan  100 mg Oral Daily  . metoprolol  200 mg Oral Daily  . traZODone  200 mg Oral QHS   Infusions:  . cefTRIAXone (ROCEPHIN)  IV     PRN: acetaminophen, guaiFENesin, ibuprofen, LORazepam  Assessment: 62yom in ED for psychotic behavior. Presented with fevers up to 101, urine culture + GNR, was started on keflex. Blood culture from 7/20 showing GPC in clusters. Pharmacy  consulted to dose Vanco for bacteremia.  7/21 >> Keflex>> 7/22 >> Rocephin x1 ED  Tmax: 101.1 WBCs: 8.3 Renal: SCr 1.5 CrCl 56ml/min  7/20 blood: GPC in clusters 7/20 urine: GNR   Goal of Therapy:  Vancomycin trough level 15-20 mcg/ml Appropriate antibiotic dosing for renal function; eradication of infection  Plan:   Start Vancomycin 1750mg  IV Q24H  Measure antibiotic drug levels at steady state  Follow up culture results  Loma Boston, PharmD Pager: [phone] 06/19/2014 11:40 AM

## 2014-06-19 NOTE — BH Assessment (Signed)
[facility] Assessment Progress Note  LM with [facility] for Christian [name] that pt will be medically admitted. Pt will have psychiatric consult on the floor.

## 2014-06-19 NOTE — Consult Note (Addendum)
  Psychiatric Specialty Exam: Physical Exam  ROS  Blood pressure 127/62, pulse 86, temperature 100.3 F (37.9 C), temperature source Oral, resp. rate 18, height 6' (1.829 m), weight 125.646 kg (277 lb), SpO2 96.00%.Body mass index is 37.56 kg/(m^2).  General Appearance: Casual  Eye Contact::  Good  Speech:  Clear and Coherent  Volume:  Normal  Mood:  Euthymic  Affect:  Appropriate  Thought Process:  Coherent and Logical  Orientation:  Full (Time, Place, and Person)  Thought Content:  Negative  Suicidal Thoughts:  No  Homicidal Thoughts:  No  Memory:  Immediate;   Good Recent;   Good Remote;   Good  Judgement:  Impaired  Insight:  Lacking  Psychomotor Activity:  ;has gout in both feet so walking is difficult  Concentration:  Good  Recall:  Good  Akathisia:  Negative  Handed:  Right  AIMS (if indicated):     Assets:  Architect Housing Leisure Time Social Support Transportation  Sleep:   adequate  Mr Codd is okay with staying here to get his gout under control but is adamant that he will not go to a psychiatric hospital.  He says his wife and daughter are lying.  We talked to both his wife and his daughter and they say he is out of control spending money and offering others money they do not have.  Dr Nolen Mu called yesterday and agreed he needs to be inpatient as mania is a new part of his history.  The plan is to continue seeking an inpatient bed for treatment of bipolar disorder.  Mr Lamm is being admitted to inpatient on the medical ward for a positive and spiking temperatures.

## 2014-06-19 NOTE — ED Notes (Signed)
Pt is currently sleeping,  Pt in NAD,  Sitter at bedside

## 2014-06-20 LAB — BASIC METABOLIC PANEL
Anion gap: 12 (ref 5–15)
BUN: 27 mg/dL — ABNORMAL HIGH (ref 6–23)
CO2: 30 mEq/L (ref 19–32)
Calcium: 9.2 mg/dL (ref 8.4–10.5)
Chloride: 93 mEq/L — ABNORMAL LOW (ref 96–112)
Creatinine, Ser: 1.56 mg/dL — ABNORMAL HIGH (ref 0.50–1.35)
GFR calc Af Amer: 54 mL/min — ABNORMAL LOW (ref >90)
GFR calc non Af Amer: 47 mL/min — ABNORMAL LOW (ref >90)
Glucose, Bld: 162 mg/dL — ABNORMAL HIGH (ref 70–99)
Potassium: 4.7 mEq/L (ref 3.7–5.3)
Sodium: 135 mEq/L — ABNORMAL LOW (ref 137–147)

## 2014-06-20 LAB — MRSA PCR SCREENING: MRSA by PCR: NEGATIVE

## 2014-06-20 LAB — GLUCOSE, CAPILLARY
Glucose-Capillary: 145 mg/dL — ABNORMAL HIGH (ref 70–99)
Glucose-Capillary: 138 mg/dL — ABNORMAL HIGH (ref 70–99)
Glucose-Capillary: 145 mg/dL — ABNORMAL HIGH (ref 70–99)
Glucose-Capillary: 109 mg/dL — ABNORMAL HIGH (ref 70–99)
Glucose-Capillary: 141 mg/dL — ABNORMAL HIGH (ref 70–99)

## 2014-06-20 LAB — CULTURE, BLOOD (ROUTINE X 2)

## 2014-06-20 LAB — CBC
HCT: 38.9 % — ABNORMAL LOW (ref 39.0–52.0)
Hemoglobin: 12.8 g/dL — ABNORMAL LOW (ref 13.0–17.0)
MCH: 29.5 pg (ref 26.0–34.0)
MCHC: 32.9 g/dL (ref 30.0–36.0)
MCV: 89.6 fL (ref 78.0–100.0)
Platelets: 141 10*3/uL — ABNORMAL LOW (ref 150–400)
RBC: 4.34 MIL/uL (ref 4.22–5.81)
RDW: 13.2 % (ref 11.5–15.5)
WBC: 5.9 10*3/uL (ref 4.0–10.5)

## 2014-06-20 MED ORDER — DEXTROSE 5 % IV SOLN
1.0000 g | INTRAVENOUS | Status: DC
  Administered 2014-06-20: 1 g via INTRAVENOUS
  Filled 2014-06-20 (×2): qty 10

## 2014-06-20 NOTE — Progress Notes (Signed)
[facility] PROGRESS NOTE  Christian [name] ZOX:[id number] DOB: Dec 11, 1950 DOA: 06/17/2014 PCP: Leanor Rubenstein, MD  Assessment/Plan:  Principal Problem:   Bipolar affective disorder, current episode manic with psychotic symptoms - Psych managing. - Awaiting placement into psychiatric hospital  Active Problems:   UTI (urinary tract infection) - On rocephin    Positive blood culture - repeat blood cx obtained - only one blood cx positive with coagulase negative staph as such most likely a contaminant. Will d/c vancomycin.   Code Status: stable Family Communication: none at bedside Disposition Plan: Per psychiatry. Medically cleared to transition to psychiatric hospital   Consultants:  Psychiatry on board.  Procedures:  none  Antibiotics:  Rocephin   HPI/Subjective: No new complaints. No acute issues reported overnight.  Objective: Filed Vitals:   06/20/14 1411  BP: 120/79  Pulse: 77  Temp: 98.8 F (37.1 C)  Resp: 18    Intake/Output Summary (Last 24 hours) at 06/20/14 1431 Last data filed at 06/20/14 1153  Gross per 24 hour  Intake    480 ml  Output   1400 ml  Net   -920 ml   Filed Weights   06/17/14 0039 06/19/14 1300  Weight: 125.646 kg (277 lb) 122.9 kg (270 lb 15.1 oz)    Exam:   General:  Pt in nAD, alert and awake  Cardiovascular: rrr, no mrg  Respiratory: cta bl, no wheezes  Abdomen: soft, obese, nt  Musculoskeletal: no cyanosis   Data Reviewed: Basic Metabolic Panel:  Recent Labs Lab 06/17/14 0130 06/17/14 2338 06/19/14 1557 06/20/14 0330  NA 139 137  --  135*  K 4.2 4.2  --  4.7  CL 99 97  --  93*  CO2 30 31  --  30  GLUCOSE 221* 105*  --  162*  BUN 30* 24*  --  27*  CREATININE 1.84* 1.51* 1.46* 1.56*  CALCIUM 9.2 8.9  --  9.2   Liver Function Tests:  Recent Labs Lab 06/17/14 0130 06/17/14 2338  AST 47* 28  ALT 41 31  ALKPHOS 68 55  BILITOT 0.4 0.6  PROT 7.0 6.4  ALBUMIN 3.4* 2.8*   No results found  for this basename: LIPASE, AMYLASE,  in the last 168 hours No results found for this basename: AMMONIA,  in the last 168 hours CBC:  Recent Labs Lab 06/17/14 0130 06/17/14 2331 06/19/14 0952 06/20/14 0330  WBC 6.9 8.1 8.3 5.9  NEUTROABS 5.4 6.1 6.7  --   HGB 12.6* 11.7* 11.9* 12.8*  HCT 38.7* 35.3* 35.8* 38.9*  MCV 89.6 89.6 88.6 89.6  PLT 137* 122* 129* 141*   Cardiac Enzymes: No results found for this basename: CKTOTAL, CKMB, CKMBINDEX, TROPONINI,  in the last 168 hours BNP (last 3 results) No results found for this basename: PROBNP,  in the last 8760 hours CBG:  Recent Labs Lab 06/19/14 1134 06/19/14 1728 06/19/14 2151 06/20/14 0754 06/20/14 1138  GLUCAP 157* 251* 145* 138* 145*    Recent Results (from the past 240 hour(s))  URINE CULTURE     Status: None   Collection Time    06/17/14  5:17 AM      Result Value Ref Range Status   Specimen Description URINE, CLEAN CATCH   Final   Special Requests NONE   Final   Culture  Setup Time     Final   Value: 06/17/2014 09:13     Performed at [facility] Count  Final   Value: >=100,000 COLONIES/ML     Performed at [facility]   Culture     Final   Value: CITROBACTER KOSERI     Performed at [facility]   Report Status 06/19/2014 FINAL   Final   Organism ID, Bacteria CITROBACTER KOSERI   Final  CULTURE, BLOOD (ROUTINE X 2)     Status: None   Collection Time    06/17/14 11:36 PM      Result Value Ref Range Status   Specimen Description BLOOD R HAND   Final   Special Requests BOTTLES DRAWN AEROBIC AND ANAEROBIC 5 MLS EACH   Final   Culture  Setup Time     Final   Value: 06/18/2014 04:05     Performed at [facility]   Culture     Final   Value:        BLOOD CULTURE RECEIVED NO GROWTH TO DATE CULTURE WILL BE HELD FOR 5 DAYS BEFORE ISSUING A FINAL NEGATIVE REPORT     Performed at [facility]   Report Status PENDING   Incomplete  CULTURE, BLOOD (ROUTINE X 2)      Status: None   Collection Time    06/17/14 11:38 PM      Result Value Ref Range Status   Specimen Description BLOOD RIGHT ANTECUBITAL   Final   Special Requests BOTTLES DRAWN AEROBIC AND ANAEROBIC EACH   Final   Culture  Setup Time     Final   Value: 06/18/2014 04:04     Performed at [facility]   Culture     Final   Value: STAPHYLOCOCCUS SPECIES (COAGULASE NEGATIVE)     Note: THE SIGNIFICANCE OF ISOLATING THIS ORGANISM FROM A SINGLE SET OF BLOOD CULTURES WHEN MULTIPLE SETS ARE DRAWN IS UNCERTAIN. PLEASE NOTIFY THE MICROBIOLOGY DEPARTMENT WITHIN ONE WEEK IF SPECIATION AND SENSITIVITIES ARE REQUIRED.     Note: Gram Stain Report Called to,Read Back By and Verified With: STACEY WEST RN 10:45AM 06/19/14 BY MILSH     Performed at [facility]   Report Status 06/20/2014 FINAL   Final  CULTURE, BLOOD (ROUTINE X 2)     Status: None   Collection Time    06/19/14 12:01 PM      Result Value Ref Range Status   Specimen Description BLOOD RIGHT HAND   Final   Special Requests BOTTLES DRAWN AEROBIC AND ANAEROBIC 5 CC   Final   Culture  Setup Time     Final   Value: 06/19/2014 15:33     Performed at [facility]   Culture     Final   Value:        BLOOD CULTURE RECEIVED NO GROWTH TO DATE CULTURE WILL BE HELD FOR 5 DAYS BEFORE ISSUING A FINAL NEGATIVE REPORT     Performed at [facility]   Report Status PENDING   Incomplete  CULTURE, BLOOD (ROUTINE X 2)     Status: None   Collection Time    06/19/14 12:01 PM      Result Value Ref Range Status   Specimen Description BLOOD LEFT HAND   Final   Special Requests BOTTLES DRAWN AEROBIC AND ANAEROBIC 5 CC   Final   Culture  Setup Time     Final   Value: 06/19/2014 15:33     Performed at [facility]   Culture     Final   Value:  BLOOD CULTURE RECEIVED NO GROWTH TO DATE CULTURE WILL BE HELD FOR 5 DAYS BEFORE ISSUING A FINAL NEGATIVE REPORT     Performed at [facility]   Report Status PENDING    Incomplete  MRSA PCR SCREENING     Status: None   Collection Time    06/20/14  8:33 AM      Result Value Ref Range Status   MRSA by PCR NEGATIVE  NEGATIVE Final   Comment:            The GeneXpert MRSA Assay (FDA     approved for NASAL specimens     only), is one component of a     comprehensive MRSA colonization     surveillance program. It is not     intended to diagnose MRSA     infection nor to guide or     monitor treatment for     MRSA infections.     Studies: No results found.  Scheduled Meds: . allopurinol  300 mg Oral Daily  . ARIPiprazole  15 mg Oral QHS  . atorvastatin  10 mg Oral Daily  . colchicine  0.6 mg Oral BID  . divalproex  1,000 mg Oral BID PC  . furosemide  80 mg Oral Daily   And  . furosemide  40 mg Oral QPC supper  . heparin  5,000 Units Subcutaneous 3 times per day  . hydrALAZINE  50 mg Oral 3 times per day  . insulin aspart  0-15 Units Subcutaneous TID WC  . insulin glargine  15 Units Subcutaneous QHS  . LORazepam  0.5 mg Oral BID  . losartan  100 mg Oral Daily  . metoprolol  200 mg Oral Daily  . traZODone  200 mg Oral QHS   Continuous Infusions:    Time spent: > 35    Penny PiaVEGA, Amariya Liskey  [facility] Pager [phone]. If 7PM-7AM, please contact night-coverage at [email or web address], password [email or web address] 06/20/2014, 2:31 PM  LOS: 3 days

## 2014-06-20 NOTE — Progress Notes (Signed)
Clinical Social Work  Per chart review, patient is medically stable to DC. CSW reviewed chart which stated that patient would have a bed at [facility]. CSW called and left a message with [facility] in order to determine if they could accept patient and is currently waiting a return call. CSW contacted the following facilities to determine if they could accept patient.  [facility]- left message with admissions cooridnator  [facility]- no available beds  [facility]- no available beds  [facility]- no available beds  [facility]- no available beds  [facility]- no available beds  [facility]- available beds. Referral faxed.   Full assessment to follow.  [place], [place] [phone]

## 2014-06-20 NOTE — Progress Notes (Signed)
ANTIBIOTIC CONSULT NOTE - INITIAL  Pharmacy Consult for:  Rocephin Indication:  UTI  No Known Allergies  Patient Measurements: Height: 6' (182.9 cm) Weight: 270 lb 15.1 oz (122.9 kg) IBW/kg (Calculated) : 77.6  Vital Signs: Temp: 98.8 F (37.1 C) (07/23 1411) Temp src: Oral (07/23 1411) BP: 120/79 mmHg (07/23 1411) Pulse Rate: 77 (07/23 1411) Intake/Output from previous day: 07/22 0701 - 07/23 0700 In: 480 [P.O.:480] Out: 900 [Urine:900]  Labs:  Recent Labs  06/17/14 2331 06/17/14 2338 06/19/14 0952 06/19/14 1557 06/20/14 0330  WBC 8.1  --  8.3  --  5.9  HGB 11.7*  --  11.9*  --  12.8*  PLT 122*  --  129*  --  141*  CREATININE  --  1.51*  --  1.46* 1.56*   Estimated Creatinine Clearance: 68.2 ml/min (by C-G formula based on Cr of 1.56).    Microbiology: Recent Results (from the past 720 hour(s))  URINE CULTURE     Status: None   Collection Time    06/17/14  5:17 AM      Result Value Ref Range Status   Specimen Description URINE, CLEAN CATCH   Final   Special Requests NONE   Final   Culture  Setup Time     Final   Value: 06/17/2014 09:13     Performed at [facility]   Colony Count     Final   Value: >=100,000 COLONIES/ML     Performed at [facility]   Culture     Final   Value: CITROBACTER KOSERI     Performed at [facility]   Report Status 06/19/2014 FINAL   Final   Organism ID, Bacteria CITROBACTER KOSERI   Final  CULTURE, BLOOD (ROUTINE X 2)     Status: None   Collection Time    06/17/14 11:36 PM      Result Value Ref Range Status   Specimen Description BLOOD R HAND   Final   Special Requests BOTTLES DRAWN AEROBIC AND ANAEROBIC 5 MLS EACH   Final   Culture  Setup Time     Final   Value: 06/18/2014 04:05     Performed at [facility]   Culture     Final   Value:        BLOOD CULTURE RECEIVED NO GROWTH TO DATE CULTURE WILL BE HELD FOR 5 DAYS BEFORE ISSUING A FINAL NEGATIVE REPORT     Performed at [facility]   Report Status PENDING   Incomplete  CULTURE, BLOOD (ROUTINE X 2)     Status: None   Collection Time    06/17/14 11:38 PM      Result Value Ref Range Status   Specimen Description BLOOD RIGHT ANTECUBITAL   Final   Special Requests BOTTLES DRAWN AEROBIC AND ANAEROBIC 6ML EACH   Final   Culture  Setup Time     Final   Value: 06/18/2014 04:04     Performed at [facility]   Culture     Final   Value: STAPHYLOCOCCUS SPECIES (COAGULASE NEGATIVE)     Note: THE SIGNIFICANCE OF ISOLATING THIS ORGANISM FROM A SINGLE SET OF BLOOD CULTURES WHEN MULTIPLE SETS ARE DRAWN IS UNCERTAIN. PLEASE NOTIFY THE MICROBIOLOGY DEPARTMENT WITHIN ONE WEEK IF SPECIATION AND SENSITIVITIES ARE REQUIRED.     Note: Gram Stain Report Called to,Read Back By and Verified With: STACEY WEST RN 10:45AM 06/19/14 BY MILSH     Performed at  [facility]   Report Status 06/20/2014 FINAL   Final  CULTURE, BLOOD (ROUTINE X 2)     Status: None   Collection Time    06/19/14 12:01 PM      Result Value Ref Range Status   Specimen Description BLOOD RIGHT HAND   Final   Special Requests BOTTLES DRAWN AEROBIC AND ANAEROBIC 5 CC   Final   Culture  Setup Time     Final   Value: 06/19/2014 15:33     Performed at [facility]   Culture     Final   Value:        BLOOD CULTURE RECEIVED NO GROWTH TO DATE CULTURE WILL BE HELD FOR 5 DAYS BEFORE ISSUING A FINAL NEGATIVE REPORT     Performed at [facility]   Report Status PENDING   Incomplete  CULTURE, BLOOD (ROUTINE X 2)     Status: None   Collection Time    06/19/14 12:01 PM      Result Value Ref Range Status   Specimen Description BLOOD LEFT HAND   Final   Special Requests BOTTLES DRAWN AEROBIC AND ANAEROBIC 5 CC   Final   Culture  Setup Time     Final   Value: 06/19/2014 15:33     Performed at [facility]   Culture     Final   Value:        BLOOD CULTURE RECEIVED NO GROWTH TO DATE CULTURE WILL BE HELD FOR 5 DAYS BEFORE ISSUING A FINAL  NEGATIVE REPORT     Performed at [facility]   Report Status PENDING   Incomplete  MRSA PCR SCREENING     Status: None   Collection Time    06/20/14  8:33 AM      Result Value Ref Range Status   MRSA by PCR NEGATIVE  NEGATIVE Final   Comment:            The GeneXpert MRSA Assay (FDA     approved for NASAL specimens     only), is one component of a     comprehensive MRSA colonization     surveillance program. It is not     intended to diagnose MRSA     infection nor to guide or     monitor treatment for     MRSA infections.    Medical History: Past Medical History  Diagnosis Date  . Gout   . Hypertension   . Depression   . Mood swings   . Type II or unspecified type diabetes mellitus with unspecified complication, uncontrolled 06/02/2014  . Other and unspecified hyperlipidemia 06/02/2014  . OSA on CPAP 06/02/2014  . Bilateral lower extremity edema: chronic with venous stasis changes 06/02/2014    Medications:  Scheduled:  . allopurinol  300 mg Oral Daily  . ARIPiprazole  15 mg Oral QHS  . atorvastatin  10 mg Oral Daily  . cefTRIAXone (ROCEPHIN)  IV  1 g Intravenous Q24H  . colchicine  0.6 mg Oral BID  . divalproex  1,000 mg Oral BID PC  . furosemide  80 mg Oral Daily   And  . furosemide  40 mg Oral QPC supper  . heparin  5,000 Units Subcutaneous 3 times per day  . hydrALAZINE  50 mg Oral 3 times per day  . insulin aspart  0-15 Units Subcutaneous TID WC  . insulin glargine  15 Units Subcutaneous QHS  . LORazepam  0.5 mg  Oral BID  . losartan  100 mg Oral Daily  . metoprolol  200 mg Oral Daily  . traZODone  200 mg Oral QHS   Assessment:  Asked to assist with Rocephin therapy for this 63 year-old male with a urinary tract infection.    The urine culture of 7/20 found Citrobacter koseri, which is sensitive to Rocephin.  A single blood culture was positive for coag negative Staph; this has been interpreted as a contaminant.   Rocephin does not require dosage  adjustment based on the renal function.  Goal of Therapy:  Eradication of infection  Plan: Continue Rocephin 1 gram IV every 24 hours.  Polo Riley R.Ph. 06/20/2014,9:14 PM

## 2014-06-21 ENCOUNTER — Inpatient Hospital Stay (HOSPITAL_COMMUNITY)
Admission: AD | Admit: 2014-06-21 | Discharge: 2014-07-16 | DRG: 885 | Disposition: A | Discharge status: Home / Self Care | Payer: Medicare HMO | Source: Intra-hospital | Attending: Psychiatry | Admitting: Psychiatry

## 2014-06-21 ENCOUNTER — Encounter (HOSPITAL_COMMUNITY): Payer: Self-pay | Admitting: *Deleted

## 2014-06-21 DIAGNOSIS — M109 Gout, unspecified: Secondary | ICD-10-CM | POA: Diagnosis present

## 2014-06-21 DIAGNOSIS — N183 Chronic kidney disease, stage 3 unspecified: Secondary | ICD-10-CM | POA: Diagnosis present

## 2014-06-21 DIAGNOSIS — N179 Acute kidney failure, unspecified: Secondary | ICD-10-CM | POA: Diagnosis present

## 2014-06-21 DIAGNOSIS — I872 Venous insufficiency (chronic) (peripheral): Secondary | ICD-10-CM | POA: Diagnosis present

## 2014-06-21 DIAGNOSIS — E7800 Pure hypercholesterolemia, unspecified: Secondary | ICD-10-CM | POA: Diagnosis present

## 2014-06-21 DIAGNOSIS — E118 Type 2 diabetes mellitus with unspecified complications: Secondary | ICD-10-CM | POA: Diagnosis present

## 2014-06-21 DIAGNOSIS — G4733 Obstructive sleep apnea (adult) (pediatric): Secondary | ICD-10-CM | POA: Diagnosis present

## 2014-06-21 DIAGNOSIS — IMO0002 Reserved for concepts with insufficient information to code with codable children: Secondary | ICD-10-CM | POA: Diagnosis present

## 2014-06-21 DIAGNOSIS — I10 Essential (primary) hypertension: Secondary | ICD-10-CM

## 2014-06-21 DIAGNOSIS — I129 Hypertensive chronic kidney disease with stage 1 through stage 4 chronic kidney disease, or unspecified chronic kidney disease: Secondary | ICD-10-CM | POA: Diagnosis present

## 2014-06-21 DIAGNOSIS — R419 Unspecified symptoms and signs involving cognitive functions and awareness: Secondary | ICD-10-CM

## 2014-06-21 DIAGNOSIS — Z609 Problem related to social environment, unspecified: Secondary | ICD-10-CM | POA: Diagnosis not present

## 2014-06-21 DIAGNOSIS — G4700 Insomnia, unspecified: Secondary | ICD-10-CM | POA: Diagnosis present

## 2014-06-21 DIAGNOSIS — F312 Bipolar disorder, current episode manic severe with psychotic features: Secondary | ICD-10-CM | POA: Diagnosis present

## 2014-06-21 DIAGNOSIS — I509 Heart failure, unspecified: Secondary | ICD-10-CM | POA: Diagnosis present

## 2014-06-21 DIAGNOSIS — K219 Gastro-esophageal reflux disease without esophagitis: Secondary | ICD-10-CM | POA: Diagnosis present

## 2014-06-21 DIAGNOSIS — E1165 Type 2 diabetes mellitus with hyperglycemia: Secondary | ICD-10-CM | POA: Diagnosis present

## 2014-06-21 DIAGNOSIS — F411 Generalized anxiety disorder: Secondary | ICD-10-CM | POA: Diagnosis present

## 2014-06-21 DIAGNOSIS — Z79899 Other long term (current) drug therapy: Secondary | ICD-10-CM

## 2014-06-21 DIAGNOSIS — F22 Delusional disorders: Secondary | ICD-10-CM | POA: Diagnosis present

## 2014-06-21 DIAGNOSIS — Z9989 Dependence on other enabling machines and devices: Secondary | ICD-10-CM

## 2014-06-21 DIAGNOSIS — F321 Major depressive disorder, single episode, moderate: Secondary | ICD-10-CM | POA: Diagnosis present

## 2014-06-21 DIAGNOSIS — I369 Nonrheumatic tricuspid valve disorder, unspecified: Secondary | ICD-10-CM | POA: Diagnosis not present

## 2014-06-21 DIAGNOSIS — F3110 Bipolar disorder, current episode manic without psychotic features, unspecified: Secondary | ICD-10-CM

## 2014-06-21 DIAGNOSIS — I5033 Acute on chronic diastolic (congestive) heart failure: Secondary | ICD-10-CM | POA: Diagnosis present

## 2014-06-21 DIAGNOSIS — R600 Localized edema: Secondary | ICD-10-CM

## 2014-06-21 DIAGNOSIS — E785 Hyperlipidemia, unspecified: Secondary | ICD-10-CM | POA: Diagnosis present

## 2014-06-21 HISTORY — DX: Heart failure, unspecified: I50.9

## 2014-06-21 HISTORY — DX: Bipolar disorder, unspecified: F31.9

## 2014-06-21 LAB — GLUCOSE, CAPILLARY
Glucose-Capillary: 129 mg/dL — ABNORMAL HIGH (ref 70–99)
Glucose-Capillary: 87 mg/dL (ref 70–99)
Glucose-Capillary: 264 mg/dL — ABNORMAL HIGH (ref 70–99)
Glucose-Capillary: 187 mg/dL — ABNORMAL HIGH (ref 70–99)

## 2014-06-21 MED ORDER — INSULIN ASPART 100 UNIT/ML ~~LOC~~ SOLN
3.0000 [IU] | Freq: Three times a day (TID) | SUBCUTANEOUS | Status: DC
Start: 2014-06-21 — End: 2014-06-21

## 2014-06-21 MED ORDER — ACETAMINOPHEN 325 MG PO TABS
650.0000 mg | ORAL_TABLET | Freq: Four times a day (QID) | ORAL | Status: DC | PRN
  Administered 2014-06-21 – 2014-07-10 (×47): 650 mg via ORAL
  Filled 2014-06-21 (×47): qty 2

## 2014-06-21 MED ORDER — LORAZEPAM 0.5 MG PO TABS
0.5000 mg | ORAL_TABLET | Freq: Two times a day (BID) | ORAL | Status: DC
  Administered 2014-06-21 – 2014-06-25 (×8): 0.5 mg via ORAL
  Administered 2014-06-25: 1 mg via ORAL
  Administered 2014-06-25 – 2014-07-03 (×16): 0.5 mg via ORAL
  Filled 2014-06-21 (×24): qty 1

## 2014-06-21 MED ORDER — LOSARTAN POTASSIUM 50 MG PO TABS
100.0000 mg | ORAL_TABLET | Freq: Every day | ORAL | Status: DC
  Administered 2014-06-22 – 2014-07-03 (×12): 100 mg via ORAL
  Filled 2014-06-21 (×14): qty 2

## 2014-06-21 MED ORDER — COLCHICINE 0.6 MG PO TABS
0.6000 mg | ORAL_TABLET | Freq: Two times a day (BID) | ORAL | Status: DC
  Administered 2014-06-21 – 2014-07-16 (×49): 0.6 mg via ORAL
  Filled 2014-06-21 (×56): qty 1

## 2014-06-21 MED ORDER — ALLOPURINOL 300 MG PO TABS
300.0000 mg | ORAL_TABLET | Freq: Every day | ORAL | Status: DC
  Administered 2014-06-22 – 2014-07-16 (×25): 300 mg via ORAL
  Filled 2014-06-21 (×27): qty 1

## 2014-06-21 MED ORDER — ALUM & MAG HYDROXIDE-SIMETH 200-200-20 MG/5ML PO SUSP: 30.0000 mL | ORAL | Status: DC | PRN

## 2014-06-21 MED ORDER — MAGNESIUM HYDROXIDE 400 MG/5ML PO SUSP: 30.0000 mL | Freq: Every day | ORAL | Status: DC | PRN

## 2014-06-21 MED ORDER — GLIPIZIDE ER 10 MG PO TB24
10.0000 mg | ORAL_TABLET | Freq: Two times a day (BID) | ORAL | Status: DC
  Administered 2014-06-21 – 2014-07-01 (×21): 10 mg via ORAL
  Filled 2014-06-21 (×24): qty 1

## 2014-06-21 MED ORDER — INSULIN ASPART 100 UNIT/ML ~~LOC~~ SOLN
0.0000 [IU] | Freq: Three times a day (TID) | SUBCUTANEOUS | Status: DC
  Administered 2014-06-21: 11 [IU] via SUBCUTANEOUS
  Administered 2014-06-22 – 2014-06-24 (×3): 3 [IU] via SUBCUTANEOUS
  Administered 2014-06-24 – 2014-06-26 (×5): 4 [IU] via SUBCUTANEOUS

## 2014-06-21 MED ORDER — CEPHALEXIN 500 MG PO CAPS: 500.0000 mg | ORAL_CAPSULE | Freq: Two times a day (BID) | ORAL | Status: DC

## 2014-06-21 MED ORDER — DIVALPROEX SODIUM ER 500 MG PO TB24
1000.0000 mg | ORAL_TABLET | Freq: Two times a day (BID) | ORAL | Status: DC
  Administered 2014-06-21 – 2014-07-09 (×36): 1000 mg via ORAL
  Filled 2014-06-21 (×41): qty 2

## 2014-06-21 MED ORDER — FUROSEMIDE 40 MG PO TABS
40.0000 mg | ORAL_TABLET | Freq: Two times a day (BID) | ORAL | Status: DC
  Administered 2014-06-22 – 2014-06-25 (×7): 40 mg via ORAL
  Filled 2014-06-21 (×13): qty 1

## 2014-06-21 MED ORDER — ARIPIPRAZOLE 15 MG PO TABS
15.0000 mg | ORAL_TABLET | Freq: Every day | ORAL | Status: DC
  Administered 2014-06-22 – 2014-06-27 (×6): 15 mg via ORAL
  Filled 2014-06-21 (×7): qty 1

## 2014-06-21 MED ORDER — CEPHALEXIN 500 MG PO CAPS
500.0000 mg | ORAL_CAPSULE | Freq: Two times a day (BID) | ORAL | Status: AC
  Administered 2014-06-21 – 2014-06-28 (×14): 500 mg via ORAL
  Filled 2014-06-21: qty 2
  Filled 2014-06-21 (×6): qty 1
  Filled 2014-06-21: qty 2
  Filled 2014-06-21 (×8): qty 1

## 2014-06-21 MED ORDER — METOPROLOL SUCCINATE ER 100 MG PO TB24
200.0000 mg | ORAL_TABLET | Freq: Every day | ORAL | Status: DC
  Administered 2014-06-22 – 2014-07-16 (×24): 200 mg via ORAL
  Filled 2014-06-21 (×27): qty 2

## 2014-06-21 MED ORDER — ATORVASTATIN CALCIUM 10 MG PO TABS
10.0000 mg | ORAL_TABLET | Freq: Every day | ORAL | Status: DC
  Administered 2014-06-22 – 2014-07-16 (×26): 10 mg via ORAL
  Filled 2014-06-21 (×29): qty 1

## 2014-06-21 MED ORDER — TRAZODONE HCL 100 MG PO TABS
200.0000 mg | ORAL_TABLET | Freq: Every day | ORAL | Status: DC
  Administered 2014-06-21 – 2014-07-10 (×18): 200 mg via ORAL
  Filled 2014-06-21 (×25): qty 2

## 2014-06-21 MED ORDER — ARIPIPRAZOLE 15 MG PO TABS
15.0000 mg | ORAL_TABLET | Freq: Every day | ORAL | Status: DC
  Filled 2014-06-21 (×2): qty 1

## 2014-06-21 MED ORDER — CEPHALEXIN 500 MG PO CAPS
500.0000 mg | ORAL_CAPSULE | Freq: Two times a day (BID) | ORAL | Status: DC
  Filled 2014-06-21 (×2): qty 1

## 2014-06-21 MED ORDER — HYDRALAZINE HCL 50 MG PO TABS
50.0000 mg | ORAL_TABLET | Freq: Four times a day (QID) | ORAL | Status: DC
  Administered 2014-06-21 – 2014-07-16 (×94): 50 mg via ORAL
  Filled 2014-06-21 (×18): qty 1
  Filled 2014-06-21: qty 2
  Filled 2014-06-21 (×12): qty 1
  Filled 2014-06-21: qty 2
  Filled 2014-06-21 (×6): qty 1
  Filled 2014-06-21: qty 2
  Filled 2014-06-21 (×6): qty 1
  Filled 2014-06-21: qty 2
  Filled 2014-06-21 (×6): qty 1
  Filled 2014-06-21: qty 2
  Filled 2014-06-21: qty 1
  Filled 2014-06-21: qty 2
  Filled 2014-06-21 (×5): qty 1
  Filled 2014-06-21: qty 2
  Filled 2014-06-21 (×16): qty 1
  Filled 2014-06-21 (×2): qty 2
  Filled 2014-06-21 (×9): qty 1
  Filled 2014-06-21: qty 2
  Filled 2014-06-21 (×5): qty 1
  Filled 2014-06-21: qty 2
  Filled 2014-06-21 (×23): qty 1
  Filled 2014-06-21: qty 2
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
## 2014-06-21 NOTE — Discharge Summary (Signed)
Physician Discharge Summary  Christian [name] ZOX:[id number] DOB: 07-Nov-1951 DOA: 06/17/2014  PCP: Leanor Rubenstein, MD  Admit date: 06/17/2014 Discharge date: 06/21/2014  Time spent: > 35 minutes  Recommendations for Outpatient Follow-up:  1. D/c to behavioral health for continued evaluation and recommendations by the psychiatrist 2. Placed on Keflex for uti  Discharge Diagnoses:  Principal Problem:   Bipolar affective disorder, current episode manic with psychotic symptoms Active Problems:   UTI (urinary tract infection)   Positive blood culture   Discharge Condition: stable  Diet recommendation:  Diabetic diet  Filed Weights   06/17/14 0039 06/19/14 1300  Weight: 125.646 kg (277 lb) 122.9 kg (270 lb 15.1 oz)    History of present illness:  63 y/o with Bipolar d/o currently manic who was admitted to the hospital for further evaluation of positive blood culture and uti.  Hospital Course:  Principal Problem:   Bipolar affective disorder, current episode manic with psychotic symptoms - disposition to behavioral health hospital  Active Problems:   UTI (urinary tract infection) - Keflex for 4 more days    Positive blood culture - contaminant, vancomycin discontinued  - repeat blood cx negative to date.  Procedures:  As listed above  Consultations:  psychiatry  Discharge Exam: Filed Vitals:   06/21/14 0447  BP: 137/68  Pulse: 69  Temp: 98 F (36.7 C)  Resp: 18    General: pt in nad, alert and awake Cardiovascular: rrr, no mrg Respiratory: cta bl, no wheezes  Discharge Instructions You were cared for by a hospitalist during your hospital stay. If you have any questions about your discharge medications or the care you received while you were in the hospital after you are discharged, you can call the unit and asked to speak with the hospitalist on call if the hospitalist that took care of you is not available. Once you are discharged, your primary care physician  will handle any further medical issues. Please note that NO REFILLS for any discharge medications will be authorized once you are discharged, as it is imperative that you return to your primary care physician (or establish a relationship with a primary care physician if you do not have one) for your aftercare needs so that they can reassess your need for medications and monitor your lab values.  Discharge Instructions   Call MD for:  difficulty breathing, headache or visual disturbances    Complete by:  As directed      Call MD for:  temperature >100.4    Complete by:  As directed      Diet - low sodium heart healthy    Complete by:  As directed      Increase activity slowly    Complete by:  As directed             Medication List         allopurinol 300 MG tablet  Commonly known as:  ZYLOPRIM  Take 1 tablet (300 mg total) by mouth daily. For gout     ARIPiprazole 15 MG tablet  Commonly known as:  ABILIFY  Take 1 tablet (15 mg total) by mouth daily. For mood control     atorvastatin 10 MG tablet  Commonly known as:  LIPITOR  Take 1 tablet (10 mg total) by mouth daily. For high cholesterol     cephALEXin 500 MG capsule  Commonly known as:  KEFLEX  Take 1 capsule (500 mg total) by mouth 2 (two) times daily.  colchicine 0.6 MG tablet  Take 1 tablet (0.6 mg total) by mouth 2 (two) times daily. For Gout pain     divalproex 500 MG 24 hr tablet  Commonly known as:  DEPAKOTE ER  Take 2 tablets (1,000 mg total) by mouth 2 (two) times daily after a meal. For mood stabilization     furosemide 40 MG tablet  Commonly known as:  LASIX  Take 1-2 tablets (40-80 mg total) by mouth 2 (two) times daily. Take 2 tablets in the morning and 1 tablet in the evening: For swellings     glipiZIDE 10 MG 24 hr tablet  Commonly known as:  GLUCOTROL XL  Take 1 tablet (10 mg total) by mouth 2 (two) times daily. For diabetes     hydrALAZINE 50 MG tablet  Commonly known as:  APRESOLINE  Take 1  tablet 3 times daily for HTN     LORazepam 0.5 MG tablet  Commonly known as:  ATIVAN  Take 0.5 mg by mouth 2 (two) times daily. For anxiety     losartan 100 MG tablet  Commonly known as:  COZAAR  Take 1 tablet (100 mg total) by mouth daily. For high blood pressure     metoprolol 200 MG 24 hr tablet  Commonly known as:  TOPROL-XL  Take 1 tablet (200 mg total) by mouth daily. For high blood pressure     traZODone 100 MG tablet  Commonly known as:  DESYREL  Take 2 tablets (200 mg total) by mouth at bedtime. For sleep       No Known Allergies    The results of significant diagnostics from this hospitalization (including imaging, microbiology, ancillary and laboratory) are listed below for reference.    Significant Diagnostic Studies: Dg Chest 2 View  06/17/2014   CLINICAL DATA:  Cough and fever.  EXAM: CHEST  2 VIEW  COMPARISON:  CT chest 03/22/2014.  PA and lateral chest 03/06/2011.  FINDINGS: The lungs are clear. Heart size is normal. There is no pneumothorax or pleural effusion. Tortuous aorta is noted.  IMPRESSION: No acute disease.   Electronically Signed   By: Drusilla Kannerhomas  Dalessio M.D.   On: 06/17/2014 10:02   Dg Ankle Complete Left  06/18/2014   CLINICAL DATA:  Increasing pain and swelling over the last few days. No acute injury.  EXAM: LEFT ANKLE COMPLETE - 3+ VIEW  COMPARISON:  Foot radiographs 05/16/2009.  FINDINGS: The mineralization and alignment are normal. There is no evidence of acute fracture or dislocation. There is degenerative spurring of both malleoli. The talar dome and tibial plafond appear normal. The subtalar joint is not well defined, but based on the prior studies, without suspected abnormality. There is calcaneal spurring. The Achilles tendon is somewhat ill-defined.  There is soft tissue edema throughout the lower leg with multiple soft tissue calcifications.  IMPRESSION: Diffuse prominence of the soft tissues with scatter soft tissue calcifications suggesting  connective tissue disorder or chronic venous stasis. Grossly stable degenerative spurring at the ankle. No acute osseous findings or evidence of bone destruction.   Electronically Signed   By: Roxy HorsemanBill  Veazey M.D.   On: 06/18/2014 02:04   Dg Ankle Complete Right  06/17/2014   CLINICAL DATA:  Diffuse right ankle swelling and pain, with limited range of motion.  EXAM: RIGHT ANKLE - COMPLETE 3+ VIEW  COMPARISON:  None.  FINDINGS: There is no evidence of fracture or dislocation. The ankle mortise is intact; the interosseous space is within normal limits. No  talar tilt or subluxation is seen. A plantar calcaneal spur is noted.  The joint spaces are preserved. Diffuse soft tissue swelling is noted, with scattered soft tissue calcifications.  IMPRESSION: No evidence of fracture or dislocation.   Electronically Signed   By: Roanna Raider M.D.   On: 06/17/2014 01:31   Dg Chest Port 1 View  06/18/2014   CLINICAL DATA:  Fever.  EXAM: PORTABLE CHEST - 1 VIEW  COMPARISON:  06/17/2014 and 03/25/2011.  FINDINGS: 0006 hr. There are lower lung volumes with lordotic positioning. Allowing for this, the heart size and mediastinal contours are stable. There is mildly increased right basilar atelectasis. No consolidation, pleural effusion or edema identified. The osseous structures appear unchanged.  IMPRESSION: Lower lung volumes with resulting mild right basilar atelectasis. No evidence of pneumonia.   Electronically Signed   By: Roxy Horseman M.D.   On: 06/18/2014 00:35    Microbiology: Recent Results (from the past 240 hour(s))  URINE CULTURE     Status: None   Collection Time    06/17/14  5:17 AM      Result Value Ref Range Status   Specimen Description URINE, CLEAN CATCH   Final   Special Requests NONE   Final   Culture  Setup Time     Final   Value: 06/17/2014 09:13     Performed at [facility] Count     Final   Value: >=100,000 COLONIES/ML     Performed at [facility]   Culture      Final   Value: CITROBACTER KOSERI     Performed at [facility]   Report Status 06/19/2014 FINAL   Final   Organism ID, Bacteria CITROBACTER KOSERI   Final  CULTURE, BLOOD (ROUTINE X 2)     Status: None   Collection Time    06/17/14 11:36 PM      Result Value Ref Range Status   Specimen Description BLOOD R HAND   Final   Special Requests BOTTLES DRAWN AEROBIC AND ANAEROBIC 5 MLS EACH   Final   Culture  Setup Time     Final   Value: 06/18/2014 04:05     Performed at [facility]   Culture     Final   Value:        BLOOD CULTURE RECEIVED NO GROWTH TO DATE CULTURE WILL BE HELD FOR 5 DAYS BEFORE ISSUING A FINAL NEGATIVE REPORT     Performed at [facility]   Report Status PENDING   Incomplete  CULTURE, BLOOD (ROUTINE X 2)     Status: None   Collection Time    06/17/14 11:38 PM      Result Value Ref Range Status   Specimen Description BLOOD RIGHT ANTECUBITAL   Final   Special Requests BOTTLES DRAWN AEROBIC AND ANAEROBIC EACH   Final   Culture  Setup Time     Final   Value: 06/18/2014 04:04     Performed at [facility]   Culture     Final   Value: STAPHYLOCOCCUS SPECIES (COAGULASE NEGATIVE)     Note: THE SIGNIFICANCE OF ISOLATING THIS ORGANISM FROM A SINGLE SET OF BLOOD CULTURES WHEN MULTIPLE SETS ARE DRAWN IS UNCERTAIN. PLEASE NOTIFY THE MICROBIOLOGY DEPARTMENT WITHIN ONE WEEK IF SPECIATION AND SENSITIVITIES ARE REQUIRED.     Note: Gram Stain Report Called to,Read Back By and Verified With: STACEY WEST RN 10:45AM 06/19/14 BY MILSH  Performed at [facility]   Report Status 06/20/2014 FINAL   Final  CULTURE, BLOOD (ROUTINE X 2)     Status: None   Collection Time    06/19/14 12:01 PM      Result Value Ref Range Status   Specimen Description BLOOD RIGHT HAND   Final   Special Requests BOTTLES DRAWN AEROBIC AND ANAEROBIC 5 CC   Final   Culture  Setup Time     Final   Value: 06/19/2014 15:33     Performed at [facility]   Culture      Final   Value:        BLOOD CULTURE RECEIVED NO GROWTH TO DATE CULTURE WILL BE HELD FOR 5 DAYS BEFORE ISSUING A FINAL NEGATIVE REPORT     Performed at [facility]   Report Status PENDING   Incomplete  CULTURE, BLOOD (ROUTINE X 2)     Status: None   Collection Time    06/19/14 12:01 PM      Result Value Ref Range Status   Specimen Description BLOOD LEFT HAND   Final   Special Requests BOTTLES DRAWN AEROBIC AND ANAEROBIC 5 CC   Final   Culture  Setup Time     Final   Value: 06/19/2014 15:33     Performed at [facility]   Culture     Final   Value:        BLOOD CULTURE RECEIVED NO GROWTH TO DATE CULTURE WILL BE HELD FOR 5 DAYS BEFORE ISSUING A FINAL NEGATIVE REPORT     Performed at [facility]   Report Status PENDING   Incomplete  MRSA PCR SCREENING     Status: None   Collection Time    06/20/14  8:33 AM      Result Value Ref Range Status   MRSA by PCR NEGATIVE  NEGATIVE Final   Comment:            The GeneXpert MRSA Assay (FDA     approved for NASAL specimens     only), is one component of a     comprehensive MRSA colonization     surveillance program. It is not     intended to diagnose MRSA     infection nor to guide or     monitor treatment for     MRSA infections.     Labs: Basic Metabolic Panel:  Recent Labs Lab 06/17/14 0130 06/17/14 2338 06/19/14 1557 06/20/14 0330  NA 139 137  --  135*  K 4.2 4.2  --  4.7  CL 99 97  --  93*  CO2 30 31  --  30  GLUCOSE 221* 105*  --  162*  BUN 30* 24*  --  27*  CREATININE 1.84* 1.51* 1.46* 1.56*  CALCIUM 9.2 8.9  --  9.2   Liver Function Tests:  Recent Labs Lab 06/17/14 0130 06/17/14 2338  AST 47* 28  ALT 41 31  ALKPHOS 68 55  BILITOT 0.4 0.6  PROT 7.0 6.4  ALBUMIN 3.4* 2.8*   No results found for this basename: LIPASE, AMYLASE,  in the last 168 hours No results found for this basename: AMMONIA,  in the last 168 hours CBC:  Recent Labs Lab 06/17/14 0130 06/17/14 2331  06/19/14 0952 06/20/14 0330  WBC 6.9 8.1 8.3 5.9  NEUTROABS 5.4 6.1 6.7  --   HGB 12.6* 11.7* 11.9* 12.8*  HCT 38.7* 35.3* 35.8* 38.9*  MCV  89.6 89.6 88.6 89.6  PLT 137* 122* 129* 141*   Cardiac Enzymes: No results found for this basename: CKTOTAL, CKMB, CKMBINDEX, TROPONINI,  in the last 168 hours BNP: BNP (last 3 results) No results found for this basename: PROBNP,  in the last 8760 hours CBG:  Recent Labs Lab 06/20/14 1138 06/20/14 1704 06/20/14 2154 06/21/14 0452 06/21/14 0809  GLUCAP 145* 109* 141* 129* 87       Signed:  Penny Pia  [facility] 06/21/2014, 11:17 AM

## 2014-06-21 NOTE — Progress Notes (Signed)
Patient discharge to [facility], alert and oriented, discharge instructions given, transferred by [facility] department, patient did not wish to go, but orders and recommendations by Psychiatrist doctor given, patient in stable condition at this time

## 2014-06-21 NOTE — Tx Team (Signed)
Initial Interdisciplinary Treatment Plan  PATIENT STRENGTHS: (choose at least two) Active sense of humor Average or above average intelligence Communication skills General fund of knowledge Religious Affiliation  PATIENT STRESSORS: Marital or family conflict Medication change or noncompliance   PROBLEM LIST: Problem List/Patient Goals Date to be addressed Date deferred Reason deferred Estimated date of resolution  Mania/bipolar disorder 06-21-14   dc                                                   DISCHARGE CRITERIA:  Ability to meet basic life and health needs Improved stabilization in mood, thinking, and/or behavior Motivation to continue treatment in a less acute level of care Reduction of life-threatening or endangering symptoms to within safe limits Verbal commitment to aftercare and medication compliance  PRELIMINARY DISCHARGE PLAN: Return to previous living arrangement  PATIENT/FAMIILY INVOLVEMENT: This treatment plan has been presented to and reviewed with the patient, Christian [name], and/or family member, .  The patient and family have been given the opportunity to ask questions and make suggestions.  [name] 06/21/2014, 3:22 PM

## 2014-06-21 NOTE — Care Management Note (Signed)
    Page 1 of 1   06/21/2014     3:59:42 PM CARE MANAGEMENT NOTE 06/21/2014  Patient:  Christian [name], Christian [name]   Account Number:  [id number]  Date Initiated:  06/21/2014  Documentation initiated by:  [facility]  Subjective/Objective Assessment:   63 year old male admitted with positive blood cultures.     Action/Plan:   To [facility] when medically stable.   Anticipated DC Date:  06/21/2014   Anticipated DC Plan:  PSYCHIATRIC HOSPITAL  In-house referral  Clinical Social Worker      DC Planning Services  CM consult      Choice offered to / List presented to:             Status of service:  Completed, signed off Medicare Important Message given?  YES (If response is "NO", the following Medicare IM given date fields will be blank) Date Medicare IM given:  06/21/2014 Medicare IM given by:  [facility] Date Additional Medicare IM given:   Additional Medicare IM given by:    Discharge Disposition:  PSYCHIATRIC HOSPITAL  Per UR Regulation:  Reviewed for med. necessity/level of care/duration of stay  If discussed at Long Length of Stay Meetings, dates discussed:    Comments:

## 2014-06-21 NOTE — Progress Notes (Signed)
Clinical Social Work  CSW continues to search for placement. CSW contacted the following hospitals re: available beds:  [facility]- left a message at 10:20  [facility]- spoke with [facility] Minerva Areola) who will review case and call CSW if they can accept  [facility]- left a message at 10:22  [facility]- no available beds  [facility]- unsure of bed availability but encouraged CSW to send information. Referral faxed.  [facility]- available beds. Referral faxed.  [facility]- no available beds  [facility]- 1 male bed available. Referral faxed.  [facility]- no available beds  [facility]- admissions reports they misplaced referral that was sent yesterday. Referral re-faxed.  [facility]- no available beds  CSW will continue to follow for placement.  [place], [place] [phone]

## 2014-06-21 NOTE — Progress Notes (Signed)
Patient ID: Christian [name], male   DOB: March 08, 1951, 63 y.o.   MRN: [id number] Pt alert and oriented. Minimum insight into his mental illness. "I'm not crazy my wife is trying to keep me here I need to start telling what she do." Frequent interaction with staff. Writing and drawing on papers.  Refused group but later attended. Denies SI/HI, Denies A/[facility].  Verbally contracts for safety. c/o pain and discomfort in feet, knees and right shoulder. Using wheelchair. Requires assist with ADLs and transfers. Medication given as ordered. Monitored Q . Will continue to monitor and evaluate for stabilization.
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
## 2014-06-21 NOTE — Progress Notes (Signed)
Clinical Social Work Department BRIEF PSYCHOSOCIAL ASSESSMENT 06/21/2014  Patient:  Christian [name], Christian [name]     Account Number:  [id number]     Admit date:  06/17/2014  Clinical Social Worker:  Dennison Bulla  Date/Time:  06/21/2014 01:00 PM  Referred by:  Physician  Date Referred:  06/21/2014 Referred for  Psychosocial assessment   Other Referral:   Interview type:  Patient Other interview type:    PSYCHOSOCIAL DATA Living Status:  FAMILY Admitted from facility:   Level of care:   Primary support name:  Christian [name] Primary support relationship to patient:  SPOUSE Degree of support available:   Strong    CURRENT CONCERNS Current Concerns  Post-Acute Placement   Other Concerns:    SOCIAL WORK ASSESSMENT / PLAN CSW received referral in order to assist with psych hospital placement. Per chart review, wife placed patient under IVC and brought him to the ED due to manic behavior. Patient was admitted for medical concerns but was waiting on psych placement.    Patient has been accepted to [facility] [id number]. RN to call report to [phone]. [facility] reviewed IVC paperwork but since petitioner's name was not on examination form, [facility] requested that CSW resubmit forms. MD signed forms which was faxed to Magistrate. CSW spoke with Magistrate who confirmed IVC forms were received and that they would transport patient to [facility].    CSW went to room to explain plans to patient. Patient in bed but talking loudly and reports he is going home today. Patient reports he has been to [facility] and will not return. Patient states he will DC home and will see his psychiatrist (Dr. Nolen Mu) on outpatient basis. Patient does not want to talk about DC plans and continues to state he will go home.    CSW spoke with wife via phone who is agreeable to [facility] and reports she will follow up with patient there. Wife feels that patient does need continued treatment and is aware of IVC status.    RN aware and agreeable to plans. CSW is signing off  but available if needed.   Assessment/plan status:  No Further Intervention Required Other assessment/ plan:   Information/referral to community resources:   Will go to [facility]    PATIENT'S/FAMILY'S RESPONSE TO PLAN OF CARE: Patient alert but disengaged during assessment. Patient cannot remain focused throughout assessment and is fixated on returning home. Patient is not agreeable to [facility] but since he is under IVC will have to go for treatment. Wife feels relieved that patient will receive further treatment prior to returning home and wants to be involved in treatment team at [facility].       [place], [place] [phone]

## 2014-06-22 DIAGNOSIS — F3110 Bipolar disorder, current episode manic without psychotic features, unspecified: Secondary | ICD-10-CM

## 2014-06-22 LAB — GLUCOSE, CAPILLARY
Glucose-Capillary: 78 mg/dL (ref 70–99)
Glucose-Capillary: 101 mg/dL — ABNORMAL HIGH (ref 70–99)
Glucose-Capillary: 129 mg/dL — ABNORMAL HIGH (ref 70–99)
Glucose-Capillary: 80 mg/dL (ref 70–99)

## 2014-06-22 LAB — VALPROIC ACID LEVEL: Valproic Acid Lvl: 40.5 ug/mL — ABNORMAL LOW (ref 50.0–100.0)

## 2014-06-22 MED ORDER — HALOPERIDOL LACTATE 5 MG/ML IJ SOLN
5.0000 mg | Freq: Four times a day (QID) | INTRAMUSCULAR | Status: DC | PRN
  Administered 2014-06-23 – 2014-06-29 (×5): 5 mg via INTRAMUSCULAR
  Filled 2014-06-22 (×6): qty 1

## 2014-06-22 MED ORDER — LORAZEPAM 2 MG/ML IJ SOLN
1.0000 mg | Freq: Four times a day (QID) | INTRAMUSCULAR | Status: DC | PRN
  Administered 2014-06-23 – 2014-06-25 (×2): 1 mg via INTRAVENOUS
  Filled 2014-06-22 (×2): qty 1

## 2014-06-22 MED ORDER — LORAZEPAM 2 MG/ML IJ SOLN
INTRAMUSCULAR | Status: AC
  Filled 2014-06-22: qty 1

## 2014-06-22 MED ORDER — HALOPERIDOL LACTATE 5 MG/ML IJ SOLN
INTRAMUSCULAR | Status: AC
  Filled 2014-06-22: qty 1
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
## 2014-06-22 NOTE — [facility] Group Notes (Signed)
[facility] Group Notes:  (Clinical Social Work)  06/22/2014   11:15am-12:00pm  Summary of Progress/Problems:  The main focus of today's process group was to listen to a variety of genres of music and to identify that different types of music provoke different responses.  The patient then was able to identify personally what was soothing for them, as well as energizing.    The patient expressed understanding of how music can be used at home as a wellness/recovery tool.  For his selection, he sang a song he wrote, with the group members singing in the background.  It was well received.  Type of Therapy:  Music Therapy   Participation Level:  Active  Participation Quality:  Attentive and Sharing  Affect:  Blunted  Cognitive:  Oriented  Insight:  Engaged  Engagement in Therapy:  Engaged  Modes of Intervention:   Activity, Exploration  Ambrose Mantle, LCSW 06/22/2014, 11:47 AM

## 2014-06-22 NOTE — [facility] Suicide Risk Assessment (Signed)
Suicide Risk Assessment  Admission Assessment     Nursing information obtained from:  Patient Demographic factors:  Male;Unemployed Current Mental Status:  NA Loss Factors:  Financial problems / change in socioeconomic status Historical Factors:  Impulsivity Risk Reduction Factors:  Living with another person, especially a relative Total Time spent with patient: 45 minutes  CLINICAL FACTORS:   Bipolar Disorder:   Mixed State Unstable or Poor Therapeutic Relationship Previous Psychiatric Diagnoses and Treatments  Psychiatric Specialty Exam:     Blood pressure 140/97, pulse 93, temperature 98.9 F (37.2 C), temperature source Oral, resp. rate 18, height 6' (1.829 m), weight 274 lb (124.286 kg), SpO2 99.00%.Body mass index is 37.15 kg/(m^2).  General Appearance: Casual  Eye Contact::  Fair  Speech:  circumstantial  Volume:  Increased  Mood:  Irritable  Affect:  Labile  Thought Process:  Circumstantial  Orientation:  Full (Time, Place, and Person)  Thought Content:  Rumination  Suicidal Thoughts:  No  Homicidal Thoughts:  No  Memory:  Immediate;   Poor Recent;   Poor  Judgement:  Poor  Insight:  Shallow  Psychomotor Activity:  Normal  Concentration:  Fair  Recall:  [employer] of Knowledge:Fair  Language: Fair  Akathisia:  Negative  Handed:  Right  AIMS (if indicated):     Assets:  Desire for Improvement Housing Transportation  Sleep:      Musculoskeletal: Strength & Muscle Tone: within normal limits Gait & Station: in wheelchair Patient leans: Backward  COGNITIVE FEATURES THAT CONTRIBUTE TO RISK:  Closed-mindedness Polarized thinking    SUICIDE RISK:   Moderate:  Frequent suicidal ideation with limited intensity, and duration, some specificity in terms of plans, no associated intent, good self-control, limited dysphoria/symptomatology, some risk factors present, and identifiable protective factors, including available and accessible social support.  PLAN OF  CARE:  I certify that inpatient services furnished can reasonably be expected to improve the patient's condition.  Briggette Najarian 06/22/2014, 10:02 AM
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
## 2014-06-22 NOTE — ED Notes (Signed)
Pt. Wife called looking for pt. Personal belongings.  Nurses was notified of missing clothes.

## 2014-06-22 NOTE — Progress Notes (Signed)
The focus of this group is to help patients review their daily goal of treatment and discuss progress on daily workbooks. Pt attended the evening group session and responded to all discussion prompts from the Writer - including those that weren't directed to him. Pt reported having had a frustrating day due to missing his annual [employer] Picnic, but said he got over this disappointment and would instead look forward to next year's picnic. Pt's only additional request from Nursing Staff this evening was for a sleep aid and he was referred to his RN to discuss this. Pt was extremely intrusive to his peers, constantly talking and interrupting. Pt did not respond to attempts at redirection and requests for him to let others speak were unsuccessful.

## 2014-06-22 NOTE — Progress Notes (Signed)
1:1 note: Pt sitting in dayroom demanding to get his cane and stating that he will not be compliant with his wheelchair. Pt very unsteady on his feet and unable to walk on his own. MD notified. 1:1 ordered. MHT present. Continue 1:1 monitoring for safety and intrusiveness. Pt remains safe on unit.

## 2014-06-22 NOTE — [facility] Counselor (Signed)
Adult Psychosocial Assessment Update Interdisciplinary Team  Previous [facility] admissions/discharges:  Admissions Discharges  Date:  05/25/14 Date:  Date:  03/30/11 Date:  Date: Date:  Date: Date:  Date: Date:   Changes since the last Psychosocial Assessment (including adherence to outpatient mental health and/or substance abuse treatment, situational issues contributing to decompensation and/or relapse). Christian [name] states his wife put him here.  He has not yet had his appointment for follow-up, as it is coming up.  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             Discharge Plan 1. Will you be returning to the same living situation after discharge?   Yes:  XX No:      If no, what is your plan?    Lives with wife.       2. Would you like a referral for services when you are discharged? Yes:   XX  If yes, for what services?  No:       Dr. Nolen Mu follows him.       Summary and Recommendations (to be completed by the evaluator) This is a 63yo African-American male who was admitted under IVC with decompensation.  He will go home to live with his wife and follow-up with his psychiatrist, Dr. Nolen Mu.  He would benefit from safety monitoring, medication evaluation, psychoeducation, group therapy, and discharge planning to link with ongoing resources.                        Signature:  Sarina Ser, 06/22/2014 12:40 PM

## 2014-06-22 NOTE — ED Notes (Signed)
I went in chart to look for patients belongings that wife states they could not find, passed it off to lead tech because i did not locate the notes stating where they were.

## 2014-06-22 NOTE — Progress Notes (Signed)
Nursing 1:1 note D:Pt observed sleeping in bed with eyes closed. RR even and unlabored. No distress noted. A: 1:1 observation continues for safety  R: pt remains safe  

## 2014-06-23 LAB — GLUCOSE, CAPILLARY
Glucose-Capillary: 101 mg/dL — ABNORMAL HIGH (ref 70–99)
Glucose-Capillary: 127 mg/dL — ABNORMAL HIGH (ref 70–99)
Glucose-Capillary: 58 mg/dL — ABNORMAL LOW (ref 70–99)
Glucose-Capillary: 155 mg/dL — ABNORMAL HIGH (ref 70–99)

## 2014-06-23 NOTE — Progress Notes (Signed)
1:1 note: Pt in phone room. No signs of distress noted. Pt still verbalizing that he can walk on his own. Denies si/hi/avh. Rates pain 5/10 in right shoulder. MHT present. Continue 1:1 monitoring for safety.

## 2014-06-23 NOTE — Consult Note (Signed)
Case discussed, agree with plan 

## 2014-06-23 NOTE — [facility] Group Notes (Signed)
[facility] Group Notes:  (Nursing/MHT/Case Management/Adjunct)  Date:  06/23/2014  Time:  9:00AM  Type of Therapy:  Self Inventory  Participation Level:  Did Not Attend    Summary of Progress/Problems:  Kellie Moor 06/23/2014, 12:11 PM

## 2014-06-23 NOTE — [facility] Group Notes (Signed)
[facility] Group Notes:  (Nursing/MHT/Case Management/Adjunct)  Date:  06/23/2014  Time:  9:10AM  Type of Therapy:  Psychoeducational Skills  Participation Level:  Did Not Attend    Kellie Moor 06/23/2014, 12:12 PM

## 2014-06-23 NOTE — Progress Notes (Signed)
Patient ID: Christian [name], male   DOB: Mar 13, 1951, 63 y.o.   MRN: [id number] D)  Has been in the dayroom for the earlier part of the shift, has been pleasant and cooperative, attended group and had a snack.  CBG at hs =158.  Has been compliant with meds, went to bed and is currently dozing.  Heat packs to knees and rt shoulder for c/o mild joint aches.   A)  Remains on 1:1 obs for safety, mht at bedside .   R)  Safety maintained.

## 2014-06-23 NOTE — [facility] Group Notes (Signed)
[facility] Group Notes:  (Nursing/MHT/Case Management/Adjunct)  Date:  06/22/2014 Time:  9:00AM  Type of Therapy:  Self Inventory  Participation Level:  Did Not Attend   Kellie Moor 06/23/2014, 8:28 AM
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
## 2014-06-23 NOTE — [facility] Group Notes (Signed)
[facility] Group Notes:  (Nursing/MHT/Case Management/Adjunct)  Date:  06/22/2014  Time:  9:10 AM  Type of Therapy:  Psychoeducational Skills  Participation Level:  Did Not Attend   Kellie Moor 06/23/2014, 8:28 AM

## 2014-06-23 NOTE — Progress Notes (Signed)
1:1 note: Pt in phone room, alert and oriented. Pt denies si/hi/avh. Pt rates pain 7/10 in right shoulder. Pt stating he is still wanting to leave to make it to church. " it's fourth Sunday. i have to sing. They are going to be wondering where i am at." pt complaint with taking all medications. Pt still wanting to prove that he can walk just fine. " look i can do the crip walk and the stanky leg. i don't need this chair. Just give me my cane, and i'll be fine." MHT present. Continue 1:1 monitoring for safety.

## 2014-06-23 NOTE — Progress Notes (Signed)
Did not attend group 

## 2014-06-23 NOTE — Progress Notes (Signed)
Pts wife Pearletha Alfred called and wishes to be contacted before pt is d/c [phone]

## 2014-06-23 NOTE — Progress Notes (Signed)
[facility] MD Progress Note  06/23/2014 10:27 AM Christian [name]  MRN:  [id number] Subjective:  Says I am doing fine. " i dont know why i am here. " my wife put me here on false prentense   History of Present Illness:: Patient initially presented to ED with Being manic with family (daughter and wife) feeling he is unsafe at home. Attempting to break into the neighbor's house to get a tool in the middle of the night, daughter is worried the guy will wake up and shoot him thinking he is someone else. He is also going into bars and getting upset when they don't let him sing. Spending more money than they have, poor choices, depakote level low. He was started on his meds and remained resistent to being a part of hospital. Showed poor insight.  On evaulation today he is taking meds, was put on prn haldol and ativan that had made him calm yesterday. Still remains circumstantial and not wanting to be here. Showing poor insight and continues to ramble to make his point dominating. On wheelchair because of poor ambulation and gout. No new side effects reported on meds.  Diagnosis:   DSM5: Schizophrenia Disorders:  Delusional Disorder (297.1) Obsessive-Compulsive Disorders:   Trauma-Stressor Disorders:   Substance/Addictive Disorders:  Alcohol Related Disorder - Moderate (303.90) Depressive Disorders:  Disruptive Mood Dysregulation Disorder (296.99) Total Time spent with patient: 20 minutes  Axis I: Bipolar, Manic  ADL's:  Impaired  Sleep: Fair  Appetite:  Fair  Suicidal Ideation:  Plan:  denies Intent:  denies Homicidal Ideation:  Plan:  denies  Intent:  denies AEB (as evidenced by):  Psychiatric Specialty Exam: Physical Exam  Vitals reviewed. Constitutional: He appears well-developed and well-nourished.  Musculoskeletal: He exhibits edema.    Review of Systems  Psychiatric/Behavioral: Negative for suicidal ideas, hallucinations and substance abuse. The patient is nervous/anxious. The  patient does not have insomnia.     Blood pressure 130/65, pulse 75, temperature 97.6 F (36.4 C), temperature source Oral, resp. rate 16, height 6' (1.829 m), weight 274 lb (124.286 kg), SpO2 99.00%.Body mass index is 37.15 kg/(m^2).  General Appearance: Disheveled  Eye Solicitor::  Fair  Speech:  Loud  Volume:  Increased  Mood:  Labile  Affect:  Labile  Thought Process:  Circumstantial and Disorganized  Orientation:  Full (Time, Place, and Person)  Thought Content:  Rumination  Suicidal Thoughts:  No  Homicidal Thoughts:  No  Memory:  Immediate;   Fair Recent;   Fair  Judgement:  Poor  Insight:  Shallow  Psychomotor Activity:  Decreased  Concentration:  Fair  Recall:  [employer] of Knowledge:Fair  Language: Fair  Akathisia:  Negative  Handed:  Right  AIMS (if indicated):     Assets:  Desire for Improvement Housing Social Support  Sleep:  Number of Hours: 5.5   Musculoskeletal: Strength & Muscle Tone: within normal limits Gait & Station: On wheelchair Patient leans: Backward  Current Medications: Current Facility-Administered Medications  Medication Dose Route Frequency Provider Last Rate Last Dose  . acetaminophen (TYLENOL) tablet 650 mg  650 mg Oral Q6H PRN [name], NP   650 mg at 06/23/14 0510  . allopurinol (ZYLOPRIM) tablet 300 mg  300 mg Oral Daily [name], NP   300 mg at 06/23/14 1638  . alum & mag hydroxide-simeth (MAALOX/MYLANTA) 200-200-20 MG/5ML suspension 30 mL  30 mL Oral Q4H PRN [name], NP      . ARIPiprazole (ABILIFY) tablet 15 mg  15 mg Oral QHS [name], NP   15 mg at 06/22/14 2117  . atorvastatin (LIPITOR) tablet 10 mg  10 mg Oral Daily [name], NP   10 mg at 06/23/14 0981  . cephALEXin (KEFLEX) capsule 500 mg  500 mg Oral Q12H [name], NP   500 mg at 06/23/14 1914  . colchicine tablet 0.6 mg  0.6 mg Oral BID [name], NP   0.6 mg at 06/23/14 7829  . divalproex (DEPAKOTE ER) 24 hr tablet 1,000 mg  1,000 mg Oral BID PC [name], NP   1,000 mg at 06/23/14 5621  . furosemide (LASIX) tablet 40 mg  40 mg Oral BID [name], NP   40 mg at 06/23/14 3086  . glipiZIDE (GLUCOTROL XL) 24 hr tablet 10 mg  10 mg Oral BID AC [name], NP   10 mg at 06/23/14 5784  . haloperidol lactate (HALDOL) injection 5 mg  5 mg Intramuscular Q6H PRN Thresa Ross, MD      . hydrALAZINE (APRESOLINE) tablet 50 mg  50 mg Oral 4 times per day [name], NP   50 mg at 06/23/14 6962  . insulin aspart (novoLOG) injection 0-20 Units  0-20 Units Subcutaneous TID WC [name], NP   3 Units at 06/22/14 1812  . LORazepam (ATIVAN) injection 1 mg  1 mg Intravenous Q6H PRN Thresa Ross, MD      . LORazepam (ATIVAN) tablet 0.5 mg  0.5 mg Oral BID [name], NP   0.5 mg at 06/23/14 9528  . losartan (COZAAR) tablet 100 mg  100 mg Oral Daily [name], NP   100 mg at 06/23/14 4132  . magnesium hydroxide (MILK OF MAGNESIA) suspension 30 mL  30 mL Oral Daily PRN [name], NP      . metoprolol succinate (TOPROL-XL) 24 hr tablet 200 mg  200 mg Oral Daily [name], NP   200 mg at 06/23/14 0926  . traZODone (DESYREL) tablet 200 mg  200 mg Oral QHS [name], NP   200 mg at 06/22/14 2137    Lab Results:  Results for orders placed during the hospital encounter of 06/21/14 (from the past 48 hour(s))  GLUCOSE, CAPILLARY     Status: Abnormal   Collection Time    06/21/14  4:50 PM      Result Value Ref Range   Glucose-Capillary 264 (*) 70 - 99 mg/dL  GLUCOSE, CAPILLARY     Status: Abnormal   Collection Time    06/21/14  9:26 PM      Result Value Ref Range   Glucose-Capillary 187 (*) 70 - 99 mg/dL  GLUCOSE, CAPILLARY     Status: None   Collection Time    06/22/14  5:27 AM      Result Value Ref Range   Glucose-Capillary 78  70 - 99 mg/dL  VALPROIC ACID LEVEL     Status: Abnormal   Collection Time    06/22/14  6:26 AM      Result Value Ref Range   Valproic Acid Lvl 40.5 (*) 50.0 - 100.0 ug/mL   Comment: Performed at [facility]   GLUCOSE, CAPILLARY     Status: Abnormal   Collection Time    06/22/14 12:02 PM      Result Value Ref Range   Glucose-Capillary 101 (*) 70 - 99 mg/dL  GLUCOSE, CAPILLARY     Status: Abnormal   Collection Time    06/22/14  4:35 PM  Result Value Ref Range   Glucose-Capillary 129 (*) 70 - 99 mg/dL  GLUCOSE, CAPILLARY     Status: None   Collection Time    06/22/14  9:03 PM      Result Value Ref Range   Glucose-Capillary 80  70 - 99 mg/dL   Comment 1 Notify RN    GLUCOSE, CAPILLARY     Status: Abnormal   Collection Time    06/23/14  6:26 AM      Result Value Ref Range   Glucose-Capillary 101 (*) 70 - 99 mg/dL   Comment 1 Documented in Chart      Physical Findings: AIMS: Facial and Oral Movements Muscles of Facial Expression: None, normal Lips and Perioral Area: None, normal Jaw: None, normal Tongue: None, normal,Extremity Movements Upper (arms, wrists, hands, fingers): None, normal Lower (legs, knees, ankles, toes): None, normal, Trunk Movements Neck, shoulders, hips: None, normal, Overall Severity Severity of abnormal movements (highest score from questions above): None, normal Incapacitation due to abnormal movements: None, normal Patient's awareness of abnormal movements (rate only patient's report): No Awareness, Dental Status Current problems with teeth and/or dentures?: No Does patient usually wear dentures?: No  CIWA:    COWS:     Treatment Plan Summary: Daily contact with patient to assess and evaluate symptoms and progress in treatment Medication management Continue current medications. Has been somewhat better but would take meds time to stabilize. Remains circumstantial and poor insight.  Plan:  Medical Decision Making Problem Points:  Established problem, stable/improving (1), Review of last therapy session (1) and Review of psycho-social stressors (1) Data Points:  Review or order clinical lab tests (1) Review or order medicine tests (1) Review of  medication regiment & side effects (2)  I certify that inpatient services furnished can reasonably be expected to improve the patient's condition.   Christian [name] 06/23/2014, 10:27 AM

## 2014-06-23 NOTE — Progress Notes (Signed)
1:1 note: Pt sitting in wheelchair in dayroom, watching tv. Engaging with staff. Pt appropriate, no trying to get out of wheelchair. MHT present. continue 1:1 monitoring for safety.

## 2014-06-23 NOTE — [facility] Group Notes (Signed)
[facility] LCSW Group Therapy  06/23/2014 3:49 PM  Type of Therapy:  Group Therapy  Participation Level:  Did Not Attend  Participation Quality:  N/A  Affect:  N/A  Cognitive:  Lacking and N/A  Insight:  N/A  Engagement in Therapy:  N/A  Modes of Intervention:  Discussion, Education, Exploration, Rapport Building, Socialization and Support  Summary of Progress/Problems: Pt did not attend  Seabron Spates 06/23/2014, 3:49 PM

## 2014-06-24 ENCOUNTER — Encounter (HOSPITAL_COMMUNITY): Payer: Self-pay | Admitting: Psychiatry

## 2014-06-24 DIAGNOSIS — F312 Bipolar disorder, current episode manic severe with psychotic features: Principal | ICD-10-CM

## 2014-06-24 LAB — CULTURE, BLOOD (ROUTINE X 2): Culture: NO GROWTH

## 2014-06-24 LAB — GLUCOSE, CAPILLARY
Glucose-Capillary: 60 mg/dL — ABNORMAL LOW (ref 70–99)
Glucose-Capillary: 123 mg/dL — ABNORMAL HIGH (ref 70–99)
Glucose-Capillary: 166 mg/dL — ABNORMAL HIGH (ref 70–99)
Glucose-Capillary: 131 mg/dL — ABNORMAL HIGH (ref 70–99)
Glucose-Capillary: 107 mg/dL — ABNORMAL HIGH (ref 70–99)

## 2014-06-24 MED ORDER — GUAIFENESIN ER 600 MG PO TB12
600.0000 mg | ORAL_TABLET | Freq: Two times a day (BID) | ORAL | Status: DC
  Administered 2014-06-24 – 2014-07-16 (×45): 600 mg via ORAL
  Filled 2014-06-24 (×51): qty 1

## 2014-06-24 MED ORDER — GUAIFENESIN ER 600 MG PO TB12
600.0000 mg | ORAL_TABLET | Freq: Two times a day (BID) | ORAL | Status: DC
  Filled 2014-06-24 (×3): qty 1

## 2014-06-24 NOTE — Progress Notes (Signed)
Patient ID: Christian [name], male   DOB: 12/08/1950, 63 y.o.   MRN: [id number] D)  Woke up in good spirits this morning, got up and went into the bathroom to clean up before breakfast.  CBG was 60,  Refused snack, wanted to go to cafeteria for breakfast and juice, and was taken via w/c per tech. A)  Will continue to monitor for safety, continue POC R)  Safety maintained.

## 2014-06-24 NOTE — [facility] Group Notes (Signed)
[facility] LCSW Aftercare Discharge Planning Group Note   06/24/2014 11:44 AM  Participation Quality:  Did not attend    [place]

## 2014-06-24 NOTE — Progress Notes (Signed)
1:1 observation note: pt observed sitting in the dayroom with sitter present. Pt sitting resting with his eyes closed. Pt wheelchair within reach. Pt remains on 1:1 observation for safety. Pt safety maintained.

## 2014-06-24 NOTE — Progress Notes (Signed)
Patient ID: Christian [name], male   DOB: [birth year], 63 y.o.   MRN: [id number] D)  Woke up and has been pleasant and chatty, humorous.  Heat packs were put on his knees and wrapped with a towel as he asked, and also on his rt shoulder.  Currently trying to go back to sleep. A) remains on 1:1 obs with mht at bedside. R)  Safety maintained.

## 2014-06-24 NOTE — Progress Notes (Signed)
1:1 observation: Clinical research associate observed pt lying in bed with sitter present. Pt speech is rapid and pt has flight of ideas, and disorganized thoughts. Pt is demanding, argumentative and grandiose. Pt continues to use wheelchair d/t unsteady gait and Gout (edema to lower extremities). Pt denies SI/HI/AVH. Pt has poor insight for treatment and continues to blame his wife and daughter for having him admitted here at [facility]. Pt compliant with taking meds.  No adverse reaction to meds verbalized by pt.  Medications administered as ordered per MD. Verbal support given, pt encouraged to attend groups. 15 minute checks performed for safety. pt remains on 1:1 observation until d/c'd Pt safety maintained.

## 2014-06-24 NOTE — Progress Notes (Signed)
Pt is very talkative and loud   He insists he can walk and got up out of the wheelchair with assistance and ambulated across the room with assistance   His feet are very swollen but he refuses to agree with that  and said his gout is better and he wants to walk but the doctor and other staff say he has to stay in the wheelchair  Pt is on a 1:1 for safety and is presently safe

## 2014-06-24 NOTE — Progress Notes (Signed)
1:1 observation note: pt observed sitting in the dayroom with sitter and wife present. Pt wheelchair within reach. Pt cooperative at this time.   Pt mood is inconsistent and pt can become easily agitated and verbally aggressive. Pt remains grandiose and demanding this evening.  During supper time, pt went off the unit to attend supper in the gym. Pt became irritable, loud, demanding and difficult to redirect. Pt had to be brought back to the unit and was not able to finish supper in the gym with the rest of the pt on the  unit.

## 2014-06-24 NOTE — Progress Notes (Addendum)
Patient ID: Christian [name], male   DOB: 03-13-1951, 63 y.o.   MRN: [id number] [facility] MD Progress Note  06/24/2014 11:14 AM Jeylan Arrizon Shed  MRN:  [id number] Subjective:  Patient states: " Dr. Mervyn Skeeters my wife put me here on false pretense.'' Objective:: Patient seen and his chart reviewed. Patient with history of Bipolar disorder who was involuntary committed by his family due to bizarre and disorganized behavior such as attempting to break into the neighbor's house to get a tool in the middle of the night and going into bars and getting upset when they don't let him sing. According to his wife, he has been spending the family money recklessly, promising people money and making poor choices. Patient remains manic, labile, grandiose and delusional. He has no insight into his mental illness and has been exercising poor judgment. Patient endorsed decreased need for sleep, poor concentration, racing thoughts and he is hyper-verbal.  Diagnosis:   DSM5: Schizophrenia Disorders:  Delusional Disorder (297.1) Obsessive-Compulsive Disorders:   Trauma-Stressor Disorders:   Substance/Addictive Disorders:   Depressive Disorders:  Disruptive Mood Dysregulation Disorder (296.99) Total Time spent with patient: 20 minutes  Axis I: Bipolar affective disorder, current episode manic with psychotic symptoms  ADL's:  Impaired  Sleep: Fair  Appetite:  Fair  Suicidal Ideation:  Plan:  denies Intent:  denies Homicidal Ideation:  Plan:  denies  Intent:  denies AEB (as evidenced by):  Psychiatric Specialty Exam: Physical Exam  Vitals reviewed. Constitutional: He appears well-developed and well-nourished.  Musculoskeletal: He exhibits edema.  Psychiatric: His affect is labile. His speech is rapid and/or pressured and tangential. He is aggressive and combative. Thought content is delusional. Cognition and memory are normal. He expresses impulsivity.    Review of Systems  Constitutional: Negative.   HENT:  Negative.   Eyes: Negative.   Respiratory: Negative.   Cardiovascular: Negative.   Gastrointestinal: Negative.   Genitourinary: Negative.   Musculoskeletal: Positive for joint pain.  Skin: Negative.   Neurological: Negative.   Endo/Heme/Allergies: Negative.   Psychiatric/Behavioral: Negative for suicidal ideas, hallucinations and substance abuse. The patient is nervous/anxious. The patient does not have insomnia.     Blood pressure 150/78, pulse 79, temperature 98.6 F (37 C), temperature source Oral, resp. rate 18, height 6' (1.829 m), weight 124.286 kg (274 lb), SpO2 99.00%.Body mass index is 37.15 kg/(m^2).  General Appearance: Disheveled  Eye Solicitor::  Fair  Speech:  Loud  Volume:  Increased  Mood:  Labile  Affect:  Labile  Thought Process:  Circumstantial and Disorganized  Orientation:  Full (Time, Place, and Person)  Thought Content:  Rumination  Suicidal Thoughts:  No  Homicidal Thoughts:  No  Memory:  Immediate;   Fair Recent;   Fair  Judgement:  Poor  Insight:  Shallow  Psychomotor Activity:  Decreased  Concentration:  Fair  Recall:  [employer] of Knowledge:Fair  Language: Fair  Akathisia:  Negative  Handed:  Right  AIMS (if indicated):     Assets:  Desire for Improvement Housing Social Support  Sleep:  Number of Hours: 4.75   Musculoskeletal: Strength & Muscle Tone: within normal limits Gait & Station: On wheelchair Patient leans: Backward  Current Medications: Current Facility-Administered Medications  Medication Dose Route Frequency Provider Last Rate Last Dose  . acetaminophen (TYLENOL) tablet 650 mg  650 mg Oral Q6H PRN [name], NP   650 mg at 06/24/14 3734  . allopurinol (ZYLOPRIM) tablet 300 mg  300 mg Oral Daily [name], NP  300 mg at 06/24/14 [id number]  . alum & mag hydroxide-simeth (MAALOX/MYLANTA) 200-200-20 MG/5ML suspension 30 mL  30 mL Oral Q4H PRN Fransisca KaufmannLaura Davis, NP      . ARIPiprazole (ABILIFY) tablet 15 mg  15 mg Oral QHS Fransisca KaufmannLaura Davis,  NP   15 mg at 06/23/14 2212  . atorvastatin (LIPITOR) tablet 10 mg  10 mg Oral Daily Fransisca KaufmannLaura Davis, NP   10 mg at 06/24/14 [id number]  . cephALEXin (KEFLEX) capsule 500 mg  500 mg Oral Q12H Fransisca KaufmannLaura Davis, NP   500 mg at 06/24/14 [id number]  . colchicine tablet 0.6 mg  0.6 mg Oral BID Fransisca KaufmannLaura Davis, NP   0.6 mg at 06/24/14 0810  . divalproex (DEPAKOTE ER) 24 hr tablet 1,000 mg  1,000 mg Oral BID PC Fransisca KaufmannLaura Davis, NP   1,000 mg at 06/24/14 0810  . furosemide (LASIX) tablet 40 mg  40 mg Oral BID Fransisca KaufmannLaura Davis, NP   40 mg at 06/24/14 0809  . glipiZIDE (GLUCOTROL XL) 24 hr tablet 10 mg  10 mg Oral BID AC Fransisca KaufmannLaura Davis, NP   10 mg at 06/24/14 [id number]  . guaiFENesin (MUCINEX) 12 hr tablet 600 mg  600 mg Oral BID Cantrell Martus      . haloperidol lactate (HALDOL) injection 5 mg  5 mg Intramuscular Q6H PRN Thresa RossNadeem Akhtar, MD   5 mg at 06/23/14 1531  . hydrALAZINE (APRESOLINE) tablet 50 mg  50 mg Oral 4 times per day Fransisca KaufmannLaura Davis, NP   50 mg at 06/24/14 0809  . insulin aspart (novoLOG) injection 0-20 Units  0-20 Units Subcutaneous TID WC Fransisca KaufmannLaura Davis, NP   3 Units at 06/23/14 1201  . LORazepam (ATIVAN) injection 1 mg  1 mg Intravenous Q6H PRN Thresa RossNadeem Akhtar, MD   1 mg at 06/23/14 1530  . LORazepam (ATIVAN) tablet 0.5 mg  0.5 mg Oral BID Fransisca KaufmannLaura Davis, NP   0.5 mg at 06/24/14 [id number]  . losartan (COZAAR) tablet 100 mg  100 mg Oral Daily Fransisca KaufmannLaura Davis, NP   100 mg at 06/24/14 [id number]  . magnesium hydroxide (MILK OF MAGNESIA) suspension 30 mL  30 mL Oral Daily PRN Fransisca KaufmannLaura Davis, NP      . metoprolol succinate (TOPROL-XL) 24 hr tablet 200 mg  200 mg Oral Daily Fransisca KaufmannLaura Davis, NP   200 mg at 06/24/14 [id number]  . traZODone (DESYREL) tablet 200 mg  200 mg Oral QHS Fransisca KaufmannLaura Davis, NP   200 mg at 06/23/14 2212    Lab Results:  Results for orders placed during the hospital encounter of 06/21/14 (from the past 48 hour(s))  GLUCOSE, CAPILLARY     Status: Abnormal   Collection Time    06/22/14 12:02 PM      Result Value Ref Range   Glucose-Capillary 101 (*) 70 - 99  mg/dL  GLUCOSE, CAPILLARY     Status: Abnormal   Collection Time    06/22/14  4:35 PM      Result Value Ref Range   Glucose-Capillary 129 (*) 70 - 99 mg/dL  GLUCOSE, CAPILLARY     Status: None   Collection Time    06/22/14  9:03 PM      Result Value Ref Range   Glucose-Capillary 80  70 - 99 mg/dL   Comment 1 Notify RN    GLUCOSE, CAPILLARY     Status: Abnormal   Collection Time    06/23/14  6:26 AM      Result Value Ref Range   Glucose-Capillary 101 (*) 70 -  99 mg/dL   Comment 1 Documented in Chart    GLUCOSE, CAPILLARY     Status: Abnormal   Collection Time    06/23/14 11:41 AM      Result Value Ref Range   Glucose-Capillary 127 (*) 70 - 99 mg/dL  GLUCOSE, CAPILLARY     Status: Abnormal   Collection Time    06/23/14  4:41 PM      Result Value Ref Range   Glucose-Capillary 58 (*) 70 - 99 mg/dL  GLUCOSE, CAPILLARY     Status: Abnormal   Collection Time    06/23/14  9:25 PM      Result Value Ref Range   Glucose-Capillary 155 (*) 70 - 99 mg/dL  GLUCOSE, CAPILLARY     Status: Abnormal   Collection Time    06/24/14  6:13 AM      Result Value Ref Range   Glucose-Capillary 60 (*) 70 - 99 mg/dL   Comment 1 Notify RN    GLUCOSE, CAPILLARY     Status: Abnormal   Collection Time    06/24/14  7:47 AM      Result Value Ref Range   Glucose-Capillary 123 (*) 70 - 99 mg/dL    Physical Findings: AIMS: Facial and Oral Movements Muscles of Facial Expression: None, normal Lips and Perioral Area: None, normal Jaw: None, normal Tongue: None, normal,Extremity Movements Upper (arms, wrists, hands, fingers): None, normal Lower (legs, knees, ankles, toes): None, normal, Trunk Movements Neck, shoulders, hips: None, normal, Overall Severity Severity of abnormal movements (highest score from questions above): None, normal Incapacitation due to abnormal movements: None, normal Patient's awareness of abnormal movements (rate only patient's report): No Awareness, Dental Status Current  problems with teeth and/or dentures?: No Does patient usually wear dentures?: No  CIWA:    COWS:     Treatment Plan Summary: Daily contact with patient to assess and evaluate symptoms and progress in treatment Medication management Plan: 1. Admit for crisis management and stabilization. 2. Medication management to reduce current symptoms to base line and improve the patient's overall level of functioning: -Continue Depakote 1000mg  po BID for mood stabilization. -Continue Abilify 15mg  po Qhs for delusions/ mood. 3. Treat health problems as indicated. 4. Develop treatment plan to decrease risk of relapse upon discharge and the need for  readmission. 5. Psycho-social education regarding relapse prevention and self care. 6. Health care follow up as needed for medical problems. 7. Patient will be referred to [facility] for stabilization.    Medical Decision Making Problem Points:  Established problem, worsening (2), Review of last therapy session (1) and Review of psycho-social stressors (1) Data Points:  Review or order clinical lab tests (1) Review or order medicine tests (1) Review of medication regiment & side effects (2)  I certify that inpatient services furnished can reasonably be expected to improve the patient's condition.   Thedore Mins, MD 06/24/2014, 11:14 AM

## 2014-06-24 NOTE — Progress Notes (Signed)
NUTRITION ASSESSMENT  Pt identified as at risk on the Malnutrition Screen Tool  INTERVENTION: Educated patient on the importance of nutrition and encouraged intake of food and beverages.   NUTRITION DIAGNOSIS: No nutrition diagnosis at this time   Assessment:  Presented to ED with being manic. Hx of type 2 DM.   - Met with pt who reports eating well at home, 5-6 meals/day, and excellent during admission - Stated "the food is so good here, I don't want to leave!" - Per weight trend, pt has lost 8 pounds in the past month   63 y.o. male  Height: Ht Readings from Last 1 Encounters:  06/21/14 6' (1.829 m)    Weight: Wt Readings from Last 1 Encounters:  06/21/14 274 lb (124.286 kg)    Weight Hx: Wt Readings from Last 10 Encounters:  06/21/14 274 lb (124.286 kg)  06/19/14 270 lb 15.1 oz (122.9 kg)  05/25/14 282 lb (127.914 kg)    BMI:  Body mass index is 37.15 kg/(m^2). Pt meets criteria for class II obesity based on current BMI.  Estimated Nutritional Needs: Kcal: 15-20 kcal/kg Protein: > 1 gram protein/kg Fluid: 1 ml/kcal  Diet Order: Carb Control Pt is also offered choice of unit snacks mid-morning and mid-afternoon.  Pt is eating as desired.   Lab results and medications reviewed.   Carlis Stable MS, [place], LDN [phone] Pager [phone] Weekend/After Hours Pager

## 2014-06-24 NOTE — [facility] Group Notes (Signed)
[facility] LCSW Group Therapy  06/24/2014 1:15 pm  Type of Therapy: Process Group Therapy  Participation Level:  Active  Participation Quality:  Appropriate  Affect:  Flat  Cognitive:  Oriented  Insight:  Improving  Engagement in Group:  Limited  Engagement in Therapy:  Limited  Modes of Intervention:  Activity, Clarification, Education, Problem-solving and Support  Summary of Progress/Problems: Today's group addressed the issue of overcoming obstacles.  Patients were asked to identify their biggest obstacle post d/c that stands in the way of their on-going success, and then problem solve as to how to manage this.  "I will try to be good in here.  I got in trouble in the group over the weekend."  Pt intrusive, but redirectable.  Was able to stay silent while others spoke until another pt who is stressed about his living situation spoke.  Dalas began talking about how he had promised him money for a hotel room, at which point I intervened and made sure the other pt knew [facility] would not really be giving him any money.  This made Leonette Most mad, and he instructed that his 1:1 help get him out of the room.  "May God be with you" were his departing words.  Ida Rogue 06/24/2014   2:37 PM

## 2014-06-25 LAB — CULTURE, BLOOD (ROUTINE X 2)
Culture: NO GROWTH
Culture: NO GROWTH

## 2014-06-25 LAB — GLUCOSE, CAPILLARY
Glucose-Capillary: 73 mg/dL (ref 70–99)
Glucose-Capillary: 168 mg/dL — ABNORMAL HIGH (ref 70–99)
Glucose-Capillary: 152 mg/dL — ABNORMAL HIGH (ref 70–99)
Glucose-Capillary: 106 mg/dL — ABNORMAL HIGH (ref 70–99)

## 2014-06-25 MED ORDER — FUROSEMIDE 40 MG PO TABS
60.0000 mg | ORAL_TABLET | Freq: Two times a day (BID) | ORAL | Status: DC
  Administered 2014-06-25 – 2014-07-01 (×12): 60 mg via ORAL
  Filled 2014-06-25 (×16): qty 1

## 2014-06-25 MED ORDER — LORAZEPAM 1 MG PO TABS
ORAL_TABLET | ORAL | Status: AC
  Administered 2014-06-25: 1 mg via ORAL
  Filled 2014-06-25: qty 1

## 2014-06-25 NOTE — [facility] Group Notes (Signed)
[facility] LCSW Group Therapy  06/25/2014 1:33 PM   Type of Therapy:  Group Therapy  Participation Level:  Invited, refused to attend  Summary of Progress/Problems: Today's group focused on relapse prevention.  We defined the term, and then brainstormed on ways to prevent relapse.  [name] 06/25/2014 , 1:33 PM

## 2014-06-25 NOTE — Progress Notes (Signed)
Pt has not slept any during the night   He is talkative and demanding  He continues to make grandiose statements   Pt feet are swollen although pt does not acknowledge that his feet are swollen   He does not think he needs to be in a wheelchair and believes he can walk on his own just fine   He can ambulate some but needs stand by assist    He continues to be 1:1 due to his unsteady gait and unpredictable behavior   Pt remains safe

## 2014-06-25 NOTE — Progress Notes (Signed)
Psychoeducational Group Note  Date:  06/25/2014 Time:  1150  Group Topic/Focus:  Recovery Goals:   The focus of this group is to identify appropriate goals for recovery and establish a plan to achieve them.  Participation Level: Did Not Attend  Participation Quality:  Not Applicable  Affect:  Not Applicable  Cognitive:  Not Applicable  Insight:  Not Applicable  Engagement in Group: Not Applicable  Additional Comments:  Pt refused to attend group this morning.  Azir Muzyka E 06/25/2014, 11:58 AM

## 2014-06-25 NOTE — Progress Notes (Signed)
Patient ID: Christian [name], male   DOB: [birth year], 63 y.o.   MRN: [id number]  [facility] MD Progress Note  06/25/2014 11:55 AM Christian [name]  MRN:  [id number] Subjective:  Patient states: "I only slept 45 minutes last night. My wife sent me back here for no reason. My Doctor will tell you that I don't need to be here. I'm not manic! I'm just mad! There is a difference! Why does my wife need to bring my C-PAP when I'm leaving today?"  Objective:: Patient seen and his chart reviewed. Patient with history of Bipolar disorder who was involuntary committed by his family due to bizarre and disorganized behavior such as attempting to break into the neighbor's house to get a tool in the middle of the night and going into bars and getting upset when they don't let him sing. The patient continues to exhibit manic behaviors on the unit such as decreased need for sleep, pressured speech, flight of ideas, grandiosity, and excessive involvement in reckless activities. Christian [name] has been reported to have promised to pay for a peer to stay in a hotel for three weeks. His mood is very labile. He becomes very angry and belligerent when redirected by staff. Patient became outraged when informed that he would not be discharged today. The patient is not showing any insight into his mental illness. Patient is fixated on his wife bringing him in for help rather than on the severe nature of his manic state. The social worker has started the paperwork for a referral to [facility] due to lack of improvement.   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 Constitutional: He appears well-developed and well-nourished.  Musculoskeletal: He exhibits edema.  Psychiatric: His affect is labile. His speech is rapid and/or pressured and tangential. He is aggressive and combative. Thought content is delusional. Cognition and memory are normal. He expresses impulsivity.    Review of Systems  Constitutional: Negative.   HENT: Negative.   Eyes: Negative.   Respiratory: Negative.   Cardiovascular: Negative.   Gastrointestinal: Negative.   Genitourinary: Negative.   Musculoskeletal: Positive for joint pain.  Skin: Negative.   Neurological: Negative.   Endo/Heme/Allergies: Negative.   Psychiatric/Behavioral: Negative for suicidal ideas, hallucinations and substance abuse. The patient is nervous/anxious and has insomnia.     Blood pressure 135/68, pulse 76, temperature 98.4 F (36.9 C), temperature source Oral, resp. rate 18, height 6' (1.829 m), weight 124.286 kg (274 lb), SpO2 99.00%.Body mass index is 37.15 kg/(m^2).  General Appearance: Disheveled  Eye Solicitor::  Fair  Speech:  Loud  Volume:  Increased  Mood:  Angry, Irritable   Affect:  Labile  Thought Process:  Circumstantial and Disorganized  Orientation:  Full (Time, Place, and Person)  Thought Content:  Rumination  Suicidal Thoughts:  No  Homicidal Thoughts:  No  Memory:  Immediate;   Fair Recent;   Fair  Judgement:  Poor  Insight:  Shallow  Psychomotor Activity:  Decreased  Concentration:  Fair  Recall:  Christian [name] of Knowledge:Fair  Language: Fair  Akathisia:  Negative  Handed:  Right  AIMS (if indicated):     Assets:  Desire for Improvement Housing Social Support  Sleep:  Number of Hours: 0   Musculoskeletal: Strength & Muscle Tone: within normal limits Gait & Station:  On wheelchair Patient leans: Backward  Current Medications: Current Facility-Administered Medications  Medication Dose Route Frequency Provider Last Rate Last Dose  . acetaminophen (TYLENOL) tablet 650 mg  650 mg Oral Q6H PRN [name], NP   650 mg at 06/25/14 0525  . allopurinol (ZYLOPRIM) tablet 300 mg  300 mg Oral Daily [name], NP   300 mg at 06/25/14 3435  . alum & mag hydroxide-simeth (MAALOX/MYLANTA) 200-200-20 MG/5ML suspension 30 mL  30 mL Oral Q4H PRN [name], NP      . ARIPiprazole (ABILIFY) tablet 15 mg  15 mg Oral QHS [name], NP   15 mg at 06/24/14 2251  . atorvastatin (LIPITOR) tablet 10 mg  10 mg Oral Daily [name], NP   10 mg at 06/25/14 6861  . cephALEXin (KEFLEX) capsule 500 mg  500 mg Oral Q12H [name], NP   500 mg at 06/25/14 6837  . colchicine tablet 0.6 mg  0.6 mg Oral BID [name], NP   0.6 mg at 06/25/14 2902  . divalproex (DEPAKOTE ER) 24 hr tablet 1,000 mg  1,000 mg Oral BID PC [name], NP   1,000 mg at 06/25/14 1115  . furosemide (LASIX) tablet 40 mg  40 mg Oral BID [name], NP   40 mg at 06/25/14 5208  . glipiZIDE (GLUCOTROL XL) 24 hr tablet 10 mg  10 mg Oral BID AC [name], NP   10 mg at 06/25/14 0525  . guaiFENesin (MUCINEX) 12 hr tablet 600 mg  600 mg Oral BID Briona Korpela   600 mg at 06/25/14 0223  . haloperidol lactate (HALDOL) injection 5 mg  5 mg Intramuscular Q6H PRN Thresa Ross, MD   5 mg at 06/25/14 0315  . hydrALAZINE (APRESOLINE) tablet 50 mg  50 mg Oral 4 times per day [name], NP   50 mg at 06/25/14 0525  . insulin aspart (novoLOG) injection 0-20 Units  0-20 Units Subcutaneous TID WC [name], NP   3 Units at 06/24/14 1720  . LORazepam (ATIVAN) injection 1 mg  1 mg Intravenous Q6H PRN Thresa Ross, MD   1 mg at 06/23/14 1530  . LORazepam (ATIVAN) tablet 0.5 mg  0.5 mg Oral BID [name], NP   0.5 mg at 06/25/14 3612  . losartan (COZAAR) tablet 100 mg  100 mg Oral Daily [name], NP   100 mg at  06/25/14 2449  . magnesium hydroxide (MILK OF MAGNESIA) suspension 30 mL  30 mL Oral Daily PRN [name], NP      . metoprolol succinate (TOPROL-XL) 24 hr tablet 200 mg  200 mg Oral Daily [name], NP   200 mg at 06/25/14 7530  . traZODone (DESYREL) tablet 200 mg  200 mg Oral QHS [name], NP   200 mg at 06/24/14 2251    Lab Results:  Results for orders placed during the hospital encounter of 06/21/14 (from the past 48 hour(s))  GLUCOSE, CAPILLARY     Status: Abnormal   Collection Time    06/23/14  4:41 PM      Result Value Ref Range   Glucose-Capillary 58 (*) 70 - 99 mg/dL  GLUCOSE, CAPILLARY     Status: Abnormal   Collection Time    06/23/14  9:25 PM      Result Value Ref Range   Glucose-Capillary 155 (*) 70 - 99 mg/dL  GLUCOSE, CAPILLARY     Status: Abnormal   Collection Time    06/24/14  6:13 AM      Result Value Ref Range   Glucose-Capillary 60 (*) 70 - 99 mg/dL   Comment 1 Notify RN    GLUCOSE, CAPILLARY     Status: Abnormal   Collection Time    06/24/14  7:47 AM      Result Value Ref Range   Glucose-Capillary 123 (*) 70 - 99 mg/dL  GLUCOSE, CAPILLARY     Status: Abnormal   Collection Time    06/24/14 11:55 AM      Result Value Ref Range   Glucose-Capillary 166 (*) 70 - 99 mg/dL   Comment 1 Documented in Chart     Comment 2 Notify RN    GLUCOSE, CAPILLARY     Status: Abnormal   Collection Time    06/24/14  4:38 PM      Result Value Ref Range   Glucose-Capillary 131 (*) 70 - 99 mg/dL  GLUCOSE, CAPILLARY     Status: Abnormal   Collection Time    06/24/14  8:54 PM      Result Value Ref Range   Glucose-Capillary 107 (*) 70 - 99 mg/dL  GLUCOSE, CAPILLARY     Status: None   Collection Time    06/25/14  5:44 AM      Result Value Ref Range   Glucose-Capillary 73  70 - 99 mg/dL   Comment 1 Notify RN      Physical Findings: AIMS: Facial and Oral Movements Muscles of Facial Expression: None, normal Lips and Perioral Area: None, normal Jaw: None,  normal Tongue: None, normal,Extremity Movements Upper (arms, wrists, hands, fingers): None, normal Lower (legs, knees, ankles, toes): None, normal, Trunk Movements Neck, shoulders, hips: None, normal, Overall Severity Severity of abnormal movements (highest score from questions above): None, normal Incapacitation due to abnormal movements: None, normal Patient's awareness of abnormal movements (rate only patient's report): No Awareness, Dental Status Current problems with teeth and/or dentures?: No Does patient usually wear dentures?: No  CIWA:    COWS:     Treatment Plan Summary: Daily contact with patient to assess and evaluate symptoms and progress in treatment Medication management Plan: 1. Continue crisis management and stabilization. 2. Medication management to reduce current symptoms to base line and improve the patient's overall level of functioning: -Continue Depakote 1000mg  po BID for mood stabilization. -Continue Abilify 15mg  po Qhs for delusions/ mood. 3. Treat health problems as indicated. 4. Develop treatment plan to decrease risk of relapse upon discharge and the need for  readmission. 5. Psycho-social education regarding relapse prevention and self care. 6. Health care follow up as needed for medical problems. Continue Glipizide 10 mg BID, Novolog SSI for treatment of Type 2 Diabetes. Monitor CBG's four times daily. Continue Allopurinol 300 mg daily, Colchicine 0.6 mg BID for treatment of gout. Patient using wheelchair at this time due to unsteady gait.  7. [facility]. 8. Renew 1:1 observation due to unsteady gait, intrusiveness, and easily agitated behaviors   Medical Decision Making Problem Points:  Established problem, worsening (2), Review of last therapy session (1) and Review of psycho-social stressors (1) Data Points:  Review or  order clinical lab tests (1) Review or order medicine tests (1) Review of  medication regiment & side effects (2)  I certify that inpatient services furnished can reasonably be expected to improve the patient's condition.   [name], NP-C 06/25/2014, 11:55 AM  Patient seen, evaluated and I agree with notes by Nurse Practitioner. Thedore Mins, MD

## 2014-06-25 NOTE — Progress Notes (Signed)
Pt irritable and agitated   He has been unable to sleep even though he has had multiple medications   He is loud and demanding at times but can be redirected   Pt received IM haldol for agitation and irritability  Pt on 1:1 for safety and is presently safe

## 2014-06-25 NOTE — Progress Notes (Signed)
It was reported to writer this morning, in report, from [place], [place]., that pt wife left a list of items that are missing, from when pt was over at [facility]. Missing items, per wife: blue long pants, black shorts, 2 short sleeve shirts, 2 T-shirts, 2 underwear, 2 pair of socks. Writer contacted [address] [place] and both units denies having pt belongings. Writer spoke to Contractor at [employer] and security at [employer] who also checked with registration.  Liborio Nixon, RN., 06/25/14 1620

## 2014-06-25 NOTE — Progress Notes (Signed)
1:1 observation note: Clinical research associate observed pt in bed lying down resting with sitter present. Wheelchair within reach. Pt do not appear to be in distress. Pt remains on 1:1 observation for safety.

## 2014-06-25 NOTE — Progress Notes (Signed)
1:1 observation: pt observed sitting in his room in his wheelchair with sitter present. Pt asked if his lasix medications could be increased d/t the edema in his LLE & LRE. Pt continues to be labile, threatening and demanding. Pt has a hard time processing and following directions from staff. Pt easily agitated. Pt resistant to using wheelchair and was ambulating w/o device after been told by RN to use wheelchair or walker. Pt remains on 1:1 observation for safety.

## 2014-06-25 NOTE — Progress Notes (Signed)
1:1 observation note: Clinical research associate observed pt lying in bed with eyes closed. Pt do not appear to be in distress. Sitter present at pt bedside. Pt remains on 1:1 observation for safety. Pt wheelchair within reach. Pt safety maintained at this time.    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 Told Clinical research associate to take off on Thursday and he will match pay and double it. Pt continues to handout self made contracts that he come up with, to staff and other pts on the unit. Pt is offering  pts on the unit monetary gifts. Pt remains delusional. Pt presented with rapid, pressured speech, flight of ideas, disorganized thoughts and racing thoughts. Pt continues to be demanding, easily agitated and irritable.

## 2014-06-25 NOTE — Progress Notes (Signed)
Pt has been labile loud and demanding  He does respond ok to redirection  He was agitated and yelling at the mht   Pt requested im haldol and ativan to help him get control of his behavior and he hopes for it to help him sleep as well   Pt has very swollen feet but since the dose of lasix has been increased he has been urinating more   He is also elevating his feet     Pt on 1:1 for safety and unpredictable behaviors    Pt is safe at present

## 2014-06-26 LAB — GLUCOSE, CAPILLARY
Glucose-Capillary: 56 mg/dL — ABNORMAL LOW (ref 70–99)
Glucose-Capillary: 62 mg/dL — ABNORMAL LOW (ref 70–99)
Glucose-Capillary: 103 mg/dL — ABNORMAL HIGH (ref 70–99)
Glucose-Capillary: 175 mg/dL — ABNORMAL HIGH (ref 70–99)
Glucose-Capillary: 185 mg/dL — ABNORMAL HIGH (ref 70–99)
Glucose-Capillary: 216 mg/dL — ABNORMAL HIGH (ref 70–99)

## 2014-06-26 MED ORDER — INSULIN ASPART 100 UNIT/ML ~~LOC~~ SOLN
0.0000 [IU] | Freq: Every day | SUBCUTANEOUS | Status: DC
  Administered 2014-06-26: 2 [IU] via SUBCUTANEOUS

## 2014-06-26 MED ORDER — INSULIN ASPART 100 UNIT/ML ~~LOC~~ SOLN
0.0000 [IU] | Freq: Three times a day (TID) | SUBCUTANEOUS | Status: DC
  Administered 2014-06-27: 3 [IU] via SUBCUTANEOUS
  Administered 2014-06-27: 4 [IU] via SUBCUTANEOUS
  Administered 2014-06-27 – 2014-06-28 (×2): 3 [IU] via SUBCUTANEOUS

## 2014-06-26 NOTE — Progress Notes (Signed)
Patient ID: Christian [name], male   DOB: Nov 05, 1951, 63 y.o.   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 Client proceeds to sing a couple of songs for Clinical research associate. A: Writer introduced self to client, assessed lower extremities, noted pitting edema in feet +1, some swelling in legs. Client reports "I like you a little bit, I'll let you know if I like you a lot in the morning" Staff will monitor 1:1 for safety. R: client is safe on the unit.

## 2014-06-26 NOTE — Progress Notes (Signed)
Pt CBG recheck. Results 103

## 2014-06-26 NOTE — Progress Notes (Signed)
Pt cbg was 56 at 0650  The hypoglycemic protocal was ordered and initiated   Pt received 1 small grape juice and a tube of glucose and rechecked at 0710   At that time it was 62   Pt was given another small juice and fruit bar   He was lethargic and difficult to arouse but the second time cbg was checked he was alert and oriented and said he did not feel like his cbg was low   Pt is safe at present and will get another cbg check 0745

## 2014-06-26 NOTE — Progress Notes (Signed)
Pt has been asleep for the past 3 hours   No distress noted  His breathing is unlabored   Pt on 1:1 and is presently safe

## 2014-06-26 NOTE — Progress Notes (Signed)
Patient ID: Christian [name], male   DOB: 04-22-1951, 63 y.o.   MRN: [id number] Hypoglycemic Event  CBG: 56  Treatment: 1 tube instant glucose and 15 GM carbohydrate snack  Symptoms: Sweaty and Shaky  Follow-up CBG: Time: 0710 CBG Result:62  Possible Reasons for Event: Unknown  Comments/MD notified:notified PA Angelita Ingles J  Remember to initiate Hypoglycemia Order Set & complete

## 2014-06-26 NOTE — Progress Notes (Signed)
Patient ID: Christian [name], male   DOB: 1951-06-30, 63 y.o.   MRN: [id number]  [facility] MD Progress Note  06/26/2014 11:21 AM Christian [name]  MRN:  [id number] Subjective:  Patient states: "I want you to send me home today otherwise I will call my lawyers.''  Objective:: Patient seen and his chart reviewed. Patient has no insight into his mental illness and has been showing poor judgment. His behavior remains bizarre and thought process is disorganized. Patient continues to exhibit manic behaviors on the unit such as decreased need for sleep, pressured speech, flight of ideas, grandiosity, and excessive involvement in reckless activities. He remains grandiose promising to employed his peers and pay them a lot of money. He becomes very angry and belligerent easily. Patient is fixated on being discharged today even though is extremely symptomatic-intrusive, demanding, defiant, argumentative and oppositional. Patient is fixated on his wife bringing him in for help rather than on the severe nature of his manic state. The social worker has started the paperwork for a referral to [facility] due to lack of improvement.   Diagnosis:   DSM5: Schizophrenia Disorders:  Delusional Disorder (297.1) Obsessive-Compulsive Disorders:   Trauma-Stressor Disorders:   Substance/Addictive Disorders:   Depressive Disorders:  Disruptive Mood Dysregulation Disorder (296.99) Total Time spent with patient: 20 minutes  Axis I: Bipolar affective disorder, current episode manic with psychotic symptoms  ADL's:  Impaired  Sleep: Fair  Appetite:  Fair  Suicidal Ideation:  Plan:  denies Intent:  denies Homicidal Ideation:  Plan:  denies  Intent:  denies AEB (as evidenced by):  Psychiatric Specialty Exam: Physical Exam  Vitals reviewed. Constitutional: He appears well-developed and well-nourished.  Musculoskeletal: He exhibits edema.  Psychiatric: His affect is labile. His speech is rapid and/or  pressured and tangential. He is aggressive and combative. Thought content is delusional. Cognition and memory are normal. He expresses impulsivity.    Review of Systems  Constitutional: Negative.   HENT: Negative.   Eyes: Negative.   Respiratory: Negative.   Cardiovascular: Negative.   Gastrointestinal: Negative.   Genitourinary: Negative.   Musculoskeletal: Positive for joint pain.  Skin: Negative.   Neurological: Negative.   Endo/Heme/Allergies: Negative.   Psychiatric/Behavioral: Negative for suicidal ideas, hallucinations and substance abuse. The patient is nervous/anxious and has insomnia.     Blood pressure 133/85, pulse 91, temperature 98.6 F (37 C), temperature source Oral, resp. rate 16, height 6' (1.829 m), weight 124.286 kg (274 lb), SpO2 99.00%.Body mass index is 37.15 kg/(m^2).  General Appearance: Disheveled  Eye Solicitor::  Fair  Speech:  Loud  Volume:  Increased  Mood:  Angry, Irritable   Affect:  Labile  Thought Process:  Circumstantial and Disorganized  Orientation:  Full (Time, Place, and Person)  Thought Content:  Rumination  Suicidal Thoughts:  No  Homicidal Thoughts:  No  Memory:  Immediate;   Fair Recent;   Fair  Judgement:  Poor  Insight:  Shallow  Psychomotor Activity:  Decreased  Concentration:  Fair  Recall:  [employer] of Knowledge:Fair  Language: Fair  Akathisia:  Negative  Handed:  Right  AIMS (if indicated):     Assets:  Desire for Improvement Housing Social Support  Sleep:  Number of Hours: 6   Musculoskeletal: Strength & Muscle Tone: within normal limits Gait & Station: On wheelchair Patient leans: Backward  Current Medications: Current Facility-Administered Medications  Medication Dose Route Frequency Provider Last Rate Last Dose  . acetaminophen (TYLENOL) tablet 650 mg  650  mg Oral Q6H PRN [name], NP   650 mg at 06/26/14 0516  . allopurinol (ZYLOPRIM) tablet 300 mg  300 mg Oral Daily [name], NP   300 mg at 06/26/14  0811  . alum & mag hydroxide-simeth (MAALOX/MYLANTA) 200-200-20 MG/5ML suspension 30 mL  30 mL Oral Q4H PRN [name], NP      . ARIPiprazole (ABILIFY) tablet 15 mg  15 mg Oral QHS [name], NP   15 mg at 06/25/14 2236  . atorvastatin (LIPITOR) tablet 10 mg  10 mg Oral Daily [name], NP   10 mg at 06/26/14 0810  . cephALEXin (KEFLEX) capsule 500 mg  500 mg Oral Q12H [name], NP   500 mg at 06/26/14 0811  . colchicine tablet 0.6 mg  0.6 mg Oral BID [name], NP   0.6 mg at 06/26/14 1610  . divalproex (DEPAKOTE ER) 24 hr tablet 1,000 mg  1,000 mg Oral BID PC [name], NP   1,000 mg at 06/26/14 0810  . furosemide (LASIX) tablet 60 mg  60 mg Oral BID [name], NP   60 mg at 06/26/14 0810  . glipiZIDE (GLUCOTROL XL) 24 hr tablet 10 mg  10 mg Oral BID AC [name], NP   10 mg at 06/26/14 0516  . guaiFENesin (MUCINEX) 12 hr tablet 600 mg  600 mg Oral BID Damont Balles   600 mg at 06/26/14 0810  . haloperidol lactate (HALDOL) injection 5 mg  5 mg Intramuscular Q6H PRN Thresa Ross, MD   5 mg at 06/25/14 2234  . hydrALAZINE (APRESOLINE) tablet 50 mg  50 mg Oral 4 times per day [name], NP   50 mg at 06/26/14 0516  . insulin aspart (novoLOG) injection 0-20 Units  0-20 Units Subcutaneous TID WC [name], NP   4 Units at 06/25/14 1726  . LORazepam (ATIVAN) injection 1 mg  1 mg Intravenous Q6H PRN Thresa Ross, MD   1 mg at 06/25/14 2235  . LORazepam (ATIVAN) tablet 0.5 mg  0.5 mg Oral BID [name], NP   0.5 mg at 06/26/14 0811  . losartan (COZAAR) tablet 100 mg  100 mg Oral Daily [name], NP   100 mg at 06/26/14 0811  . magnesium hydroxide (MILK OF MAGNESIA) suspension 30 mL  30 mL Oral Daily PRN [name], NP      . metoprolol succinate (TOPROL-XL) 24 hr tablet 200 mg  200 mg Oral Daily [name], NP   200 mg at 06/26/14 0810  . traZODone (DESYREL) tablet 200 mg  200 mg Oral QHS [name], NP   200 mg at 06/25/14 2237    Lab Results:  Results for orders  placed during the hospital encounter of 06/21/14 (from the past 48 hour(s))  GLUCOSE, CAPILLARY     Status: Abnormal   Collection Time    06/24/14 11:55 AM      Result Value Ref Range   Glucose-Capillary 166 (*) 70 - 99 mg/dL   Comment 1 Documented in Chart     Comment 2 Notify RN    GLUCOSE, CAPILLARY     Status: Abnormal   Collection Time    06/24/14  4:38 PM      Result Value Ref Range   Glucose-Capillary 131 (*) 70 - 99 mg/dL  GLUCOSE, CAPILLARY     Status: Abnormal   Collection Time    06/24/14  8:54 PM      Result Value Ref Range  Glucose-Capillary 107 (*) 70 - 99 mg/dL  GLUCOSE, CAPILLARY     Status: None   Collection Time    06/25/14  5:44 AM      Result Value Ref Range   Glucose-Capillary 73  70 - 99 mg/dL   Comment 1 Notify RN    GLUCOSE, CAPILLARY     Status: Abnormal   Collection Time    06/25/14 11:49 AM      Result Value Ref Range   Glucose-Capillary 168 (*) 70 - 99 mg/dL   Comment 1 Documented in Chart     Comment 2 Notify RN    GLUCOSE, CAPILLARY     Status: Abnormal   Collection Time    06/25/14  5:03 PM      Result Value Ref Range   Glucose-Capillary 152 (*) 70 - 99 mg/dL   Comment 1 Documented in Chart     Comment 2 Notify RN    GLUCOSE, CAPILLARY     Status: Abnormal   Collection Time    06/25/14  9:00 PM      Result Value Ref Range   Glucose-Capillary 106 (*) 70 - 99 mg/dL  GLUCOSE, CAPILLARY     Status: Abnormal   Collection Time    06/26/14  6:44 AM      Result Value Ref Range   Glucose-Capillary 56 (*) 70 - 99 mg/dL  GLUCOSE, CAPILLARY     Status: Abnormal   Collection Time    06/26/14  7:13 AM      Result Value Ref Range   Glucose-Capillary 62 (*) 70 - 99 mg/dL  GLUCOSE, CAPILLARY     Status: Abnormal   Collection Time    06/26/14  7:45 AM      Result Value Ref Range   Glucose-Capillary 103 (*) 70 - 99 mg/dL   Comment 1 Documented in Chart     Comment 2 Notify RN      Physical Findings: AIMS: Facial and Oral Movements Muscles  of Facial Expression: None, normal Lips and Perioral Area: None, normal Jaw: None, normal Tongue: None, normal,Extremity Movements Upper (arms, wrists, hands, fingers): None, normal Lower (legs, knees, ankles, toes): None, normal, Trunk Movements Neck, shoulders, hips: None, normal, Overall Severity Severity of abnormal movements (highest score from questions above): None, normal Incapacitation due to abnormal movements: None, normal Patient's awareness of abnormal movements (rate only patient's report): No Awareness, Dental Status Current problems with teeth and/or dentures?: No Does patient usually wear dentures?: No  CIWA:    COWS:     Treatment Plan Summary: Daily contact with patient to assess and evaluate symptoms and progress in treatment Medication management Plan: 1. Continue crisis management and stabilization. 2. Medication management to reduce current symptoms to base line and improve the patient's overall level of functioning: -Continue Depakote 1000mg  po BID for mood stabilization. -Continue Abilify 15mg  po Qhs for delusions/ mood. 3. Treat health problems as indicated. 4. Develop treatment plan to decrease risk of relapse upon discharge and the need for  readmission. 5. Psycho-social education regarding relapse prevention and self care. 6. Health care follow up as needed for medical problems. Continue Glipizide 10 mg BID, Novolog SSI for treatment of Type 2 Diabetes. Monitor CBG's four times daily. Continue Allopurinol 300 mg daily, Colchicine 0.6 mg BID for treatment of gout. Patient using wheelchair at this time due to unsteady gait.  7. Patient will be referred to [facility] for stabilization. 8. Renew 1:1 observation due to unsteady gait,  intrusiveness, and easily agitated behaviors   Medical Decision Making Problem Points:  Established problem, worsening (2), Review of last therapy session (1) and Review of psycho-social stressors (1) Data Points:   Review or order clinical lab tests (1) Review or order medicine tests (1) Review of medication regiment & side effects (2)  I certify that inpatient services furnished can reasonably be expected to improve the patient's condition.   Manolo Bosket,MD 06/26/2014, 11:21 AM

## 2014-06-26 NOTE — Progress Notes (Signed)
1:1 observation note: pt observed sitting in his room, in his wheelchair with sitter present. Pt calm and cooperative at this time. During med administration this morning, pt presented with loud,pressured speech, demanding, easily agitated, verbally aggressive and threatening. Pt stated to Clinical research associate, "when I talk to the doctor, I will tell him not to talk to me and for him to talk to my lawyer. Pt remains paranoid and delusional. Pt continues to need redirecting by staff for inappropriate behaviors. Pt remains on 1:1 observation for safety. Pt safety maintained at this time.

## 2014-06-26 NOTE — Progress Notes (Signed)
Observation note: Pt observed sitting in dayroom with sitter present. Pt sitting down eating supper. Wheelchair in reach. Pt calm and cooperative at this time. Pt remains on 1:1 observation for safety until d/cd. Pt safety maintained at this time.

## 2014-06-26 NOTE — Progress Notes (Signed)
Adult Psychoeducational Group Note  Date:  06/26/2014 Time:  8:44 PM  Group Topic/Focus:  Wrap-Up Group:   The focus of this group is to help patients review their daily goal of treatment and discuss progress on daily workbooks.  Participation Level:  Did Not Attend  Participation Quality:  Did Not Attend  Affect:  Did Not Attend  Cognitive:  Did Not Attend  Insight: Did Not Attend  Engagement in Group:  Did Not Attend  Modes of Intervention:  Did Not Attend  Additional Comments:  Pt did not attend group because he did not wish to.  Sheran Lawless 06/26/2014, 8:44 PM

## 2014-06-26 NOTE — Progress Notes (Signed)
Observation note: Pt observed sitting in group with sitter present. Wheelchair within reach. Pt appears to be cooperative at this time. Pt remains on 1:1 observation for safety. Pt safety maintained at this time.

## 2014-06-26 NOTE — Progress Notes (Signed)
Pt slept until about 5 am  He is calmer and said he felt rested and wanted to go back to sleep after he took his medication   Pt feet are still very swollen and he is keeping them elevated   Pt is on 1:1 for unpredictable behaviors and being a high fall risk   He is presently safe

## 2014-06-26 NOTE — [facility] Group Notes (Signed)
[facility] [employer] Group Therapy  06/26/2014 , 1:06 PM    Type of Therapy:  [employer] Presentation  Participation Level:  Active  Participation Quality:  Attentive  Affect:  Blunted  Cognitive:  Oriented  Insight:  Limited  Engagement in Therapy:  Engaged  Modes of Intervention:  Discussion, Education and Socialization  Summary of Progress/Problems:  Onalee Hua from [employer] came to present his recovery story and play the guitar.   Abby was laughing and making side comments at different times during the presentation.  Presentor successfully ignored him.  Insisted at the end that the presenter play Thomasene Lot so he could sing along.  Christian [name] 06/26/2014 , 1:06 PM

## 2014-06-27 LAB — GLUCOSE, CAPILLARY
Glucose-Capillary: 135 mg/dL — ABNORMAL HIGH (ref 70–99)
Glucose-Capillary: 195 mg/dL — ABNORMAL HIGH (ref 70–99)
Glucose-Capillary: 136 mg/dL — ABNORMAL HIGH (ref 70–99)
Glucose-Capillary: 137 mg/dL — ABNORMAL HIGH (ref 70–99)

## 2014-06-27 MED ORDER — LORAZEPAM 1 MG PO TABS
2.0000 mg | ORAL_TABLET | Freq: Four times a day (QID) | ORAL | Status: DC | PRN
  Administered 2014-06-28 – 2014-07-01 (×6): 2 mg via ORAL
  Filled 2014-06-27 (×6): qty 2

## 2014-06-27 MED ORDER — INSULIN ASPART 100 UNIT/ML ~~LOC~~ SOLN
3.0000 [IU] | Freq: Three times a day (TID) | SUBCUTANEOUS | Status: DC
  Administered 2014-06-27 – 2014-06-28 (×2): 3 [IU] via SUBCUTANEOUS

## 2014-06-27 NOTE — Tx Team (Signed)
  Interdisciplinary Treatment Plan Update   Date Reviewed:  06/27/2014  Time Reviewed:  10:49 AM  Progress in Treatment:   Attending groups: Yes Participating in groups: Yes  Intrusive Taking medication as prescribed: Yes  Tolerating medication: Yes Family/Significant other contact made: Yes  Patient understands diagnosis: No  Limited insight Discussing patient identified problems/goals with staff: Yes  See initial care plan Medical problems stabilized or resolved: Yes Denies suicidal/homicidal ideation: Yes  In tx team Patient has not harmed self or others: Yes  For review of initial/current patient goals, please see plan of care.  Estimated Length of Stay:  4-5 days  Reason for Continuation of Hospitalization: Mania Medication stabilization Other; describe Mood instability  New Problems/Goals identified:  N/A  Discharge Plan or Barriers:   On wait list for [facility].  Likely return home, follow up outpt  Additional Comments:   "I want you to send me home today otherwise I will call my lawyers.''   Patient has no insight into his mental illness and has been showing poor judgment. His behavior remains bizarre and thought process is disorganized. Patient continues to exhibit manic behaviors on the unit such as decreased need for sleep, pressured speech, flight of ideas, grandiosity, and excessive involvement in reckless activities. He remains grandiose promising to employed his peers and pay them a lot of money. He becomes very angry and belligerent easily. Patient is fixated on being discharged today even though is extremely symptomatic   Attendees:  Signature: Thedore Mins, MD 06/27/2014 10:49 AM   Signature: Richelle Ito, LCSW 06/27/2014 10:49 AM  Signature: [name], NP 06/27/2014 10:49 AM  Signature: Joslyn Devon, RN 06/27/2014 10:49 AM  Signature: Liborio Nixon, RN 06/27/2014 10:49 AM  Signature:  06/27/2014 10:49 AM  Signature:   06/27/2014 10:49 AM  Signature:    Signature:     Signature:    Signature:    Signature:    Signature:      Scribe for Treatment Team:   Richelle Ito, LCSW  06/27/2014 10:49 AM
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
## 2014-06-27 NOTE — Progress Notes (Signed)
  D: Patient was up earlier but resting now. Given tylenol for shoulder pain earlier. Needing some help to transfer from wheelchair to bed. Had a lot of pain in rt knee when he woke up for noon meds and had trouble moving around at first. Still requires assistance to move around.  A: Staff will continue to monitor on 1:1 for fall risk and manic behaviors. R: Cooperative with 1:1 staff this afternoon.

## 2014-06-27 NOTE — [facility] Group Notes (Signed)
[facility] Group Notes:  (Counselor/Nursing/MHT/Case Management/Adjunct)  06/27/2014 1:15PM  Type of Therapy:  Group Therapy  Participation  Invited.  Chose to not attend  Summary of Progress/Problems: The topic for group was balance in life.  Pt participated in the discussion about when their life was in balance and out of balance and how this feels.  Pt discussed ways to get back in balance and short term goals they can work on to get where they want to be.    Christian [name] 06/27/2014 2:22 PM

## 2014-06-27 NOTE — Progress Notes (Signed)
Pt did not attend wrap up group this evening.  

## 2014-06-27 NOTE — Progress Notes (Signed)
1:1 note  Pt. Is sitting up in the day room awaiting his dinner.  Pt. Is pleasant, alert and cooperative.  No acute distress noted.  Pt. Is appropriate with staff and others.  Pt. Will continue on 1:1 observation for his safety with Tia, MHT present.

## 2014-06-27 NOTE — Progress Notes (Signed)
Patient ID: Christian [name], male   DOB: 12/17/1950, 63 y.o.   MRN: [id number]  [facility] MD Progress Note  06/27/2014 12:20 PM Christian [name]  MRN:  [id number] Subjective:  Patient states: "I was told I could leave today. I am going to call my attorney! My wife will vouch for me. Hell no! I am not going to the state hospital. You can just talk to my Attorney! I am getting out today."   Objective:: Patient seen and his chart reviewed. Patient has no insight into his mental illness and has been showing poor judgment. His behavior remains bizarre and thought process is disorganized. Patient continues to exhibit manic behaviors on the unit such as decreased need for sleep, pressured speech, flight of ideas, grandiosity, and excessive involvement in reckless activities.  He becomes very angry and belligerent easily. Patient is fixated on being discharged today even though is extremely symptomatic-intrusive, demanding, defiant, argumentative and oppositional. Patient is fixated on his wife bringing him in for help rather than on the severe nature of his manic state. The social worker has started the paperwork for a referral to [facility] due to lack of improvement.   Diagnosis:   DSM5: Schizophrenia Disorders:  Delusional Disorder (297.1) Obsessive-Compulsive Disorders:   Trauma-Stressor Disorders:   Substance/Addictive Disorders:   Depressive Disorders:  Disruptive Mood Dysregulation Disorder (296.99) Total Time spent with patient: 20 minutes  Axis I: Bipolar affective disorder, current episode manic with psychotic symptoms  ADL's:  Impaired  Sleep: Fair  Appetite:  Fair  Suicidal Ideation:  Plan:  denies Intent:  denies Homicidal Ideation:  Plan:  denies  Intent:  denies AEB (as evidenced by):  Psychiatric Specialty Exam: Physical Exam  Vitals reviewed. Musculoskeletal: He exhibits edema.  Psychiatric: His affect is labile. His speech is rapid and/or pressured and  tangential. He is aggressive and combative. Thought content is delusional. Cognition and memory are normal. He expresses impulsivity.    Review of Systems  Constitutional: Negative.   HENT: Negative.   Eyes: Negative.   Respiratory: Negative.   Cardiovascular: Positive for leg swelling (Improved with increase in lasix dosage ).  Gastrointestinal: Negative.   Genitourinary: Negative.   Musculoskeletal: Positive for joint pain.  Skin: Negative.   Neurological: Negative.   Endo/Heme/Allergies: Negative.   Psychiatric/Behavioral: Negative for suicidal ideas, hallucinations and substance abuse. The patient is nervous/anxious and has insomnia.     Blood pressure 136/81, pulse 96, temperature 98.6 F (37 C), temperature source Oral, resp. rate 20, height 6' (1.829 m), weight 124.286 kg (274 lb), SpO2 99.00%.Body mass index is 37.15 kg/(m^2).  General Appearance: Disheveled  Eye Solicitor::  Fair  Speech:  Loud  Volume:  Increased  Mood:  Angry, Irritable   Affect:  Labile  Thought Process:  Circumstantial and Disorganized  Orientation:  Full (Time, Place, and Person)  Thought Content:  Rumination  Suicidal Thoughts:  No  Homicidal Thoughts:  No  Memory:  Immediate;   Fair Recent;   Fair  Judgement:  Poor  Insight:  Shallow  Psychomotor Activity:  Decreased  Concentration:  Fair  Recall:  [employer] of Knowledge:Fair  Language: Fair  Akathisia:  Negative  Handed:  Right  AIMS (if indicated):     Assets:  Desire for Improvement Housing Social Support  Sleep:  Number of Hours: 0.5   Musculoskeletal: Strength & Muscle Tone: within normal limits Gait & Station: On wheelchair Patient leans: Backward  Current Medications: Current Facility-Administered Medications  Medication  Dose Route Frequency Provider Last Rate Last Dose  . acetaminophen (TYLENOL) tablet 650 mg  650 mg Oral Q6H PRN [name], NP   650 mg at 06/27/14 1107  . allopurinol (ZYLOPRIM) tablet 300 mg  300 mg Oral  Daily [name], NP   300 mg at 06/27/14 0981  . alum & mag hydroxide-simeth (MAALOX/MYLANTA) 200-200-20 MG/5ML suspension 30 mL  30 mL Oral Q4H PRN [name], NP      . ARIPiprazole (ABILIFY) tablet 15 mg  15 mg Oral QHS [name], NP   15 mg at 06/26/14 2243  . atorvastatin (LIPITOR) tablet 10 mg  10 mg Oral Daily [name], NP   10 mg at 06/27/14 1914  . cephALEXin (KEFLEX) capsule 500 mg  500 mg Oral Q12H [name], NP   500 mg at 06/27/14 0804  . colchicine tablet 0.6 mg  0.6 mg Oral BID [name], NP   0.6 mg at 06/27/14 7829  . divalproex (DEPAKOTE ER) 24 hr tablet 1,000 mg  1,000 mg Oral BID PC [name], NP   1,000 mg at 06/27/14 0804  . furosemide (LASIX) tablet 60 mg  60 mg Oral BID [name], NP   60 mg at 06/27/14 0804  . glipiZIDE (GLUCOTROL XL) 24 hr tablet 10 mg  10 mg Oral BID AC [name], NP   10 mg at 06/27/14 5621  . guaiFENesin (MUCINEX) 12 hr tablet 600 mg  600 mg Oral BID Arvell Pulsifer   600 mg at 06/27/14 0804  . haloperidol lactate (HALDOL) injection 5 mg  5 mg Intramuscular Q6H PRN Thresa Ross, MD   5 mg at 06/25/14 2234  . hydrALAZINE (APRESOLINE) tablet 50 mg  50 mg Oral 4 times per day [name], NP   50 mg at 06/27/14 1218  . insulin aspart (novoLOG) injection 0-20 Units  0-20 Units Subcutaneous TID WC Kerry Hough, PA-C   4 Units at 06/27/14 1218  . insulin aspart (novoLOG) injection 0-5 Units  0-5 Units Subcutaneous QHS Kerry Hough, PA-C   2 Units at 06/26/14 2302  . LORazepam (ATIVAN) tablet 0.5 mg  0.5 mg Oral BID [name], NP   0.5 mg at 06/27/14 3086  . LORazepam (ATIVAN) tablet 2 mg  2 mg Oral Q6H PRN Kerry Hough, PA-C      . losartan (COZAAR) tablet 100 mg  100 mg Oral Daily [name], NP   100 mg at 06/27/14 0804  . magnesium hydroxide (MILK OF MAGNESIA) suspension 30 mL  30 mL Oral Daily PRN [name], NP      . metoprolol succinate (TOPROL-XL) 24 hr tablet 200 mg  200 mg Oral Daily [name], NP   200 mg at  06/27/14 0804  . traZODone (DESYREL) tablet 200 mg  200 mg Oral QHS [name], NP   200 mg at 06/26/14 2243    Lab Results:  Results for orders placed during the hospital encounter of 06/21/14 (from the past 48 hour(s))  GLUCOSE, CAPILLARY     Status: Abnormal   Collection Time    06/25/14  5:03 PM      Result Value Ref Range   Glucose-Capillary 152 (*) 70 - 99 mg/dL   Comment 1 Documented in Chart     Comment 2 Notify RN    GLUCOSE, CAPILLARY     Status: Abnormal   Collection Time    06/25/14  9:00 PM      Result Value  Ref Range   Glucose-Capillary 106 (*) 70 - 99 mg/dL  GLUCOSE, CAPILLARY     Status: Abnormal   Collection Time    06/26/14  6:44 AM      Result Value Ref Range   Glucose-Capillary 56 (*) 70 - 99 mg/dL  GLUCOSE, CAPILLARY     Status: Abnormal   Collection Time    06/26/14  7:13 AM      Result Value Ref Range   Glucose-Capillary 62 (*) 70 - 99 mg/dL  GLUCOSE, CAPILLARY     Status: Abnormal   Collection Time    06/26/14  7:45 AM      Result Value Ref Range   Glucose-Capillary 103 (*) 70 - 99 mg/dL   Comment 1 Documented in Chart     Comment 2 Notify RN    GLUCOSE, CAPILLARY     Status: Abnormal   Collection Time    06/26/14 11:25 AM      Result Value Ref Range   Glucose-Capillary 175 (*) 70 - 99 mg/dL   Comment 1 Documented in Chart     Comment 2 Notify RN    GLUCOSE, CAPILLARY     Status: Abnormal   Collection Time    06/26/14  4:57 PM      Result Value Ref Range   Glucose-Capillary 185 (*) 70 - 99 mg/dL  GLUCOSE, CAPILLARY     Status: Abnormal   Collection Time    06/26/14  8:40 PM      Result Value Ref Range   Glucose-Capillary 216 (*) 70 - 99 mg/dL  GLUCOSE, CAPILLARY     Status: Abnormal   Collection Time    06/27/14  6:26 AM      Result Value Ref Range   Glucose-Capillary 135 (*) 70 - 99 mg/dL  GLUCOSE, CAPILLARY     Status: Abnormal   Collection Time    06/27/14 11:32 AM      Result Value Ref Range   Glucose-Capillary 195 (*) 70 - 99  mg/dL   Comment 1 Documented in Chart     Comment 2 Notify RN      Physical Findings: AIMS: Facial and Oral Movements Muscles of Facial Expression: None, normal Lips and Perioral Area: None, normal Jaw: None, normal Tongue: None, normal,Extremity Movements Upper (arms, wrists, hands, fingers): None, normal Lower (legs, knees, ankles, toes): None, normal, Trunk Movements Neck, shoulders, hips: None, normal, Overall Severity Severity of abnormal movements (highest score from questions above): None, normal Incapacitation due to abnormal movements: None, normal Patient's awareness of abnormal movements (rate only patient's report): No Awareness, Dental Status Current problems with teeth and/or dentures?: No Does patient usually wear dentures?: No  CIWA:    COWS:     Treatment Plan Summary: Daily contact with patient to assess and evaluate symptoms and progress in treatment Medication management Plan: 1. Continue crisis management and stabilization. 2. Medication management to reduce current symptoms to base line and improve the patient's overall level of functioning: -Continue Depakote 1000mg  po BID for mood stabilization. -Continue Abilify 15mg  po Qhs for delusions/ mood. 3. Treat health problems as indicated. 4. Develop treatment plan to decrease risk of relapse upon discharge and the need for  readmission. 5. Psycho-social education regarding relapse prevention and self care. 6. Health care follow up as needed for medical problems. Continue Glipizide 10 mg BID, Novolog SSI for treatment of Type 2 Diabetes. Start Novolog 3 units TID with meals for elevated blood sugars. Monitor CBG's four times  daily. Continue Allopurinol 300 mg daily, Colchicine 0.6 mg BID for treatment of gout.  Patient using wheelchair at this time due to unsteady gait.  7. [facility]. 8. Renew 1:1 observation due to unsteady gait, intrusiveness, and  easily agitated behaviors  9. Recheck Depakote level in am as last level was sub-therapeutic   Medical Decision Making Problem Points:  Established problem, worsening (2), Review of last therapy session (1) and Review of psycho-social stressors (1) Data Points:  Review or order clinical lab tests (1) Review or order medicine tests (1) Review of medication regiment & side effects (2)  I certify that inpatient services furnished can reasonably be expected to improve the patient's condition.   Fransisca KaufmannDAVIS, LAURA, NP-C 06/27/2014, 12:20 PM   Patient seen, evaluated and I agree with notes by Nurse Practitioner. Thedore MinsMojeed Landynn Dupler, MD

## 2014-06-27 NOTE — Progress Notes (Signed)
Patient ID: Christian [name], male   DOB: [birth year], 63 y.o.   MRN: [id number] D: client interacting with staff and later in dayroom with peers, loud, laughing. "I'm going to sleep tonight, give me my medicine early" A: Writer provided emotional support administered medication as scheduled (see MAR). Client refused trazodone "I'm already sleepy" Staff will monitor q8min for safety. R: client is safe on unit, did not attend group.

## 2014-06-28 LAB — GLUCOSE, CAPILLARY
Glucose-Capillary: 66 mg/dL — ABNORMAL LOW (ref 70–99)
Glucose-Capillary: 105 mg/dL — ABNORMAL HIGH (ref 70–99)
Glucose-Capillary: 206 mg/dL — ABNORMAL HIGH (ref 70–99)
Glucose-Capillary: 116 mg/dL — ABNORMAL HIGH (ref 70–99)
Glucose-Capillary: 144 mg/dL — ABNORMAL HIGH (ref 70–99)
Glucose-Capillary: 131 mg/dL — ABNORMAL HIGH (ref 70–99)

## 2014-06-28 LAB — VALPROIC ACID LEVEL: Valproic Acid Lvl: 49 ug/mL — ABNORMAL LOW (ref 50.0–100.0)

## 2014-06-28 MED ORDER — ARIPIPRAZOLE 10 MG PO TABS
10.0000 mg | ORAL_TABLET | Freq: Two times a day (BID) | ORAL | Status: DC
  Administered 2014-06-28 – 2014-07-05 (×14): 10 mg via ORAL
  Filled 2014-06-28 (×18): qty 1

## 2014-06-28 NOTE — Progress Notes (Deleted)
Patient ID: Christian [name], male   DOB: 05/13/1951, 63 y.o.   MRN: [id number] Patient ID: Christian [name], male   DOB: 1951/05/30, 63 y.o.   MRN: [id number]  [facility] MD Progress Note  06/28/2014 10:58 AM [name]  MRN:  [id number] Subjective:  Patient states: "I was told I could leave today. I am going to call my attorney! My wife will vouch for me. Hell no! I am not going to the state hospital. You can just talk to my Attorney! I am getting out today."   Objective:: Patient seen and his chart reviewed. Patient has no insight into his mental illness and has been showing poor judgment. His behavior remains bizarre and thought process is disorganized. Patient continues to exhibit manic behaviors on the unit such as decreased need for sleep, pressured speech, flight of ideas, grandiosity, and excessive involvement in reckless activities.  He becomes very angry and belligerent easily. Patient is fixated on being discharged today even though is extremely symptomatic-intrusive, demanding, defiant, argumentative and oppositional. Patient is fixated on his wife bringing him in for help rather than on the severe nature of his manic state. The social worker has started the paperwork for a referral to [facility] due to lack of improvement.   Diagnosis:   DSM5: Schizophrenia Disorders:  Delusional Disorder (297.1) Obsessive-Compulsive Disorders:   Trauma-Stressor Disorders:   Substance/Addictive Disorders:   Depressive Disorders:  Disruptive Mood Dysregulation Disorder (296.99) Total Time spent with patient: 20 minutes  Axis I: Bipolar affective disorder, current episode manic with psychotic symptoms  ADL's:  Impaired  Sleep: Fair  Appetite:  Fair  Suicidal Ideation:  Plan:  denies Intent:  denies Homicidal Ideation:  Plan:  denies  Intent:  denies AEB (as evidenced by):  Psychiatric Specialty Exam: Physical Exam  Vitals reviewed. Musculoskeletal: He exhibits edema.   Psychiatric: His affect is labile. His speech is rapid and/or pressured and tangential. He is aggressive and combative. Thought content is delusional. Cognition and memory are normal. He expresses impulsivity.    Review of Systems  Constitutional: Negative.   HENT: Negative.   Eyes: Negative.   Respiratory: Negative.   Cardiovascular: Positive for leg swelling (Improved with increase in lasix dosage ).  Gastrointestinal: Negative.   Genitourinary: Negative.   Musculoskeletal: Positive for joint pain.  Skin: Negative.   Neurological: Negative.   Endo/Heme/Allergies: Negative.   Psychiatric/Behavioral: Negative for suicidal ideas, hallucinations and substance abuse. The patient is nervous/anxious and has insomnia.     Blood pressure 138/87, pulse 87, temperature 98 F (36.7 C), temperature source Oral, resp. rate 16, height 6' (1.829 m), weight 124.286 kg (274 lb), SpO2 99.00%.Body mass index is 37.15 kg/(m^2).  General Appearance: Disheveled  Eye Solicitor::  Fair  Speech:  Loud  Volume:  Increased  Mood:  Angry, Irritable   Affect:  Labile  Thought Process:  Circumstantial and Disorganized  Orientation:  Full (Time, Place, and Person)  Thought Content:  Rumination  Suicidal Thoughts:  No  Homicidal Thoughts:  No  Memory:  Immediate;   Fair Recent;   Fair  Judgement:  Poor  Insight:  Shallow  Psychomotor Activity:  Decreased  Concentration:  Fair  Recall:  [employer] of Knowledge:Fair  Language: Fair  Akathisia:  Negative  Handed:  Right  AIMS (if indicated):     Assets:  Desire for Improvement Housing Social Support  Sleep:  Number of Hours: 4.5   Musculoskeletal: Strength & Muscle Tone: within normal limits  Gait & Station: On wheelchair Patient leans: Backward  Current Medications: Current Facility-Administered Medications  Medication Dose Route Frequency Provider Last Rate Last Dose  . acetaminophen (TYLENOL) tablet 650 mg  650 mg Oral Q6H PRN Fransisca KaufmannLaura Davis, NP    650 mg at 06/28/14 0427  . allopurinol (ZYLOPRIM) tablet 300 mg  300 mg Oral Daily Fransisca KaufmannLaura Davis, NP   300 mg at 06/28/14 0820  . alum & mag hydroxide-simeth (MAALOX/MYLANTA) 200-200-20 MG/5ML suspension 30 mL  30 mL Oral Q4H PRN Fransisca KaufmannLaura Davis, NP      . ARIPiprazole (ABILIFY) tablet 15 mg  15 mg Oral QHS Fransisca KaufmannLaura Davis, NP   15 mg at 06/27/14 2205  . atorvastatin (LIPITOR) tablet 10 mg  10 mg Oral Daily Fransisca KaufmannLaura Davis, NP   10 mg at 06/28/14 0820  . colchicine tablet 0.6 mg  0.6 mg Oral BID Fransisca KaufmannLaura Davis, NP   0.6 mg at 06/28/14 0820  . divalproex (DEPAKOTE ER) 24 hr tablet 1,000 mg  1,000 mg Oral BID PC Fransisca KaufmannLaura Davis, NP   1,000 mg at 06/28/14 0820  . furosemide (LASIX) tablet 60 mg  60 mg Oral BID Fransisca KaufmannLaura Davis, NP   60 mg at 06/27/14 1809  . glipiZIDE (GLUCOTROL XL) 24 hr tablet 10 mg  10 mg Oral BID AC Fransisca KaufmannLaura Davis, NP   10 mg at 06/28/14 [id number]  . guaiFENesin (MUCINEX) 12 hr tablet 600 mg  600 mg Oral BID Carmin Alvidrez   600 mg at 06/28/14 0820  . haloperidol lactate (HALDOL) injection 5 mg  5 mg Intramuscular Q6H PRN Thresa RossNadeem Akhtar, MD   5 mg at 06/25/14 2234  . hydrALAZINE (APRESOLINE) tablet 50 mg  50 mg Oral 4 times per day Fransisca KaufmannLaura Davis, NP   50 mg at 06/28/14 [id number]  . insulin aspart (novoLOG) injection 0-20 Units  0-20 Units Subcutaneous TID WC Kerry HoughSpencer E Simon, PA-C   3 Units at 06/27/14 1716  . insulin aspart (novoLOG) injection 0-5 Units  0-5 Units Subcutaneous QHS Kerry HoughSpencer E Simon, PA-C   2 Units at 06/26/14 2302  . insulin aspart (novoLOG) injection 3 Units  3 Units Subcutaneous TID WC Fransisca KaufmannLaura Davis, NP   3 Units at 06/27/14 1717  . LORazepam (ATIVAN) tablet 0.5 mg  0.5 mg Oral BID Fransisca KaufmannLaura Davis, NP   0.5 mg at 06/28/14 0820  . LORazepam (ATIVAN) tablet 2 mg  2 mg Oral Q6H PRN Kerry HoughSpencer E Simon, PA-C      . losartan (COZAAR) tablet 100 mg  100 mg Oral Daily Fransisca KaufmannLaura Davis, NP   100 mg at 06/28/14 0820  . magnesium hydroxide (MILK OF MAGNESIA) suspension 30 mL  30 mL Oral Daily PRN Fransisca KaufmannLaura Davis, NP      .  metoprolol succinate (TOPROL-XL) 24 hr tablet 200 mg  200 mg Oral Daily Fransisca KaufmannLaura Davis, NP   200 mg at 06/28/14 0820  . traZODone (DESYREL) tablet 200 mg  200 mg Oral QHS Fransisca KaufmannLaura Davis, NP   200 mg at 06/26/14 2243    Lab Results:  Results for orders placed during the hospital encounter of 06/21/14 (from the past 48 hour(s))  GLUCOSE, CAPILLARY     Status: Abnormal   Collection Time    06/26/14 11:25 AM      Result Value Ref Range   Glucose-Capillary 175 (*) 70 - 99 mg/dL   Comment 1 Documented in Chart     Comment 2 Notify RN    GLUCOSE, CAPILLARY     Status: Abnormal  Collection Time    06/26/14  4:57 PM      Result Value Ref Range   Glucose-Capillary 185 (*) 70 - 99 mg/dL  GLUCOSE, CAPILLARY     Status: Abnormal   Collection Time    06/26/14  8:40 PM      Result Value Ref Range   Glucose-Capillary 216 (*) 70 - 99 mg/dL  GLUCOSE, CAPILLARY     Status: Abnormal   Collection Time    06/27/14  6:26 AM      Result Value Ref Range   Glucose-Capillary 135 (*) 70 - 99 mg/dL  GLUCOSE, CAPILLARY     Status: Abnormal   Collection Time    06/27/14 11:32 AM      Result Value Ref Range   Glucose-Capillary 195 (*) 70 - 99 mg/dL   Comment 1 Documented in Chart     Comment 2 Notify RN    GLUCOSE, CAPILLARY     Status: Abnormal   Collection Time    06/27/14  4:59 PM      Result Value Ref Range   Glucose-Capillary 136 (*) 70 - 99 mg/dL  GLUCOSE, CAPILLARY     Status: Abnormal   Collection Time    06/27/14  9:03 PM      Result Value Ref Range   Glucose-Capillary 137 (*) 70 - 99 mg/dL  GLUCOSE, CAPILLARY     Status: Abnormal   Collection Time    06/28/14  6:20 AM      Result Value Ref Range   Glucose-Capillary 66 (*) 70 - 99 mg/dL  VALPROIC ACID LEVEL     Status: Abnormal   Collection Time    06/28/14  6:23 AM      Result Value Ref Range   Valproic Acid Lvl 49.0 (*) 50.0 - 100.0 ug/mL   Comment: Performed at [facility]  GLUCOSE, CAPILLARY     Status: Abnormal   Collection  Time    06/28/14  7:04 AM      Result Value Ref Range   Glucose-Capillary 105 (*) 70 - 99 mg/dL    Physical Findings: AIMS: Facial and Oral Movements Muscles of Facial Expression: None, normal Lips and Perioral Area: None, normal Jaw: None, normal Tongue: None, normal,Extremity Movements Upper (arms, wrists, hands, fingers): None, normal Lower (legs, knees, ankles, toes): None, normal, Trunk Movements Neck, shoulders, hips: None, normal, Overall Severity Severity of abnormal movements (highest score from questions above): None, normal Incapacitation due to abnormal movements: None, normal Patient's awareness of abnormal movements (rate only patient's report): No Awareness, Dental Status Current problems with teeth and/or dentures?: No Does patient usually wear dentures?: No  CIWA:    COWS:     Treatment Plan Summary: Daily contact with patient to assess and evaluate symptoms and progress in treatment Medication management Plan: 1. Continue crisis management and stabilization. 2. Medication management to reduce current symptoms to base line and improve the patient's overall level of functioning: -Continue Depakote 1000mg  po BID for mood stabilization. -Continue Abilify 15mg  po Qhs for delusions/ mood. 3. Treat health problems as indicated. 4. Develop treatment plan to decrease risk of relapse upon discharge and the need for  readmission. 5. Psycho-social education regarding relapse prevention and self care. 6. Health care follow up as needed for medical problems. Continue Glipizide 10 mg BID, Novolog SSI for treatment of Type 2 Diabetes. Start Novolog 3 units TID with meals for elevated blood sugars. Monitor CBG's four times daily. Continue Allopurinol 300 mg daily,  Colchicine 0.6 mg BID for treatment of gout.  Patient using wheelchair at this time due to unsteady gait.  7. Patient will be referred to [facility] for stabilization. 8. Renew 1:1 observation due to  unsteady gait, intrusiveness, and easily agitated behaviors  9. Recheck Depakote level in am as last level was sub-therapeutic   Medical Decision Making Problem Points:  Established problem, worsening (2), Review of last therapy session (1) and Review of psycho-social stressors (1) Data Points:  Review or order clinical lab tests (1) Review or order medicine tests (1) Review of medication regiment & side effects (2)  I certify that inpatient services furnished can reasonably be expected to improve the patient's condition.   Thedore Mins, NP-C 06/28/2014, 10:58 AM

## 2014-06-28 NOTE — Progress Notes (Signed)
Patient ID: Christian [name], male   DOB: 06/20/1951, 62 y.o.   MRN: [id number] 1-1 Monitoring Note- D. Patient remains 1.1. Monitoring for high fall risk. Pt is currently resting quietly and is no acute distress. A. Staff at bedside. R. Patient is safe. Will continue to monitor as ordered for safety.   

## 2014-06-28 NOTE — Progress Notes (Signed)
Patient ID: Christian [name], male   DOB: 07-23-1951, 63 y.o.   MRN: [id number] [facility] MD Progress Note  06/28/2014 10:58 AM Christian [name]  MRN:  [id number] Subjective:  Patient states: "If you don't let me out today, I am going to sue you, sue [facility].''  Objective:: Patient seen and his chart reviewed. Patient has no insight into his mental illness, continues to show poor judgment, being belligerent and throws tantrums when thing does not go his way. His behavior remains bizarre and thought process is disorganized. Patient continues to exhibit manic behaviors on the unit such as decreased need for sleep, pressured speech, flight of ideas, grandiosity, and excessive involvement in reckless activities.  Patient is fixated on being discharged from the hospital even though is extremely symptomatic-continues to be demanding, defiant, argumentative and oppositional.  The social worker has started the paperwork for a referral to [facility] due to lack of improvement.   Diagnosis:   DSM5: Schizophrenia Disorders:  Delusional Disorder (297.1) Obsessive-Compulsive Disorders:   Trauma-Stressor Disorders:   Substance/Addictive Disorders:   Depressive Disorders:  Disruptive Mood Dysregulation Disorder (296.99) Total Time spent with patient: 20 minutes  Axis I: Bipolar affective disorder, current episode manic with psychotic symptoms  ADL's:  Impaired  Sleep: Fair  Appetite:  Fair  Suicidal Ideation:  Plan:  denies Intent:  denies Homicidal Ideation:  Plan:  denies  Intent:  denies AEB (as evidenced by):  Psychiatric Specialty Exam: Physical Exam  Vitals reviewed. Musculoskeletal: He exhibits edema.  Psychiatric: His affect is labile. His speech is rapid and/or pressured and tangential. He is aggressive and combative. Thought content is delusional. Cognition and memory are normal. He expresses impulsivity.    Review of Systems  Constitutional:  Negative.   HENT: Negative.   Eyes: Negative.   Respiratory: Negative.   Cardiovascular: Positive for leg swelling (Improved with increase in lasix dosage ).  Gastrointestinal: Negative.   Genitourinary: Negative.   Musculoskeletal: Positive for joint pain.  Skin: Negative.   Neurological: Negative.   Endo/Heme/Allergies: Negative.   Psychiatric/Behavioral: Negative for suicidal ideas, hallucinations and substance abuse. The patient is nervous/anxious and has insomnia.     Blood pressure 138/87, pulse 87, temperature 98 F (36.7 C), temperature source Oral, resp. rate 16, height 6' (1.829 m), weight 124.286 kg (274 lb), SpO2 99.00%.Body mass index is 37.15 kg/(m^2).  General Appearance: Disheveled  Eye Solicitor::  Fair  Speech:  Loud  Volume:  Increased  Mood:  Angry, Irritable   Affect:  Labile  Thought Process:  Circumstantial and Disorganized  Orientation:  Full (Time, Place, and Person)  Thought Content:  Rumination  Suicidal Thoughts:  No  Homicidal Thoughts:  No  Memory:  Immediate;   Fair Recent;   Fair  Judgement:  Poor  Insight:  Shallow  Psychomotor Activity:  Decreased  Concentration:  Fair  Recall:  [employer] of Knowledge:Fair  Language: Fair  Akathisia:  Negative  Handed:  Right  AIMS (if indicated):     Assets:  Desire for Improvement Housing Social Support  Sleep:  Number of Hours: 4.5   Musculoskeletal: Strength & Muscle Tone: within normal limits Gait & Station: On wheelchair Patient leans: Backward  Current Medications: Current Facility-Administered Medications  Medication Dose Route Frequency Provider Last Rate Last Dose  . acetaminophen (TYLENOL) tablet 650 mg  650 mg Oral Q6H PRN [name], NP   650 mg at 06/28/14 0427  . allopurinol (ZYLOPRIM) tablet  300 mg  300 mg Oral Daily [name], NP   300 mg at 06/28/14 0820  . alum & mag hydroxide-simeth (MAALOX/MYLANTA) 200-200-20 MG/5ML suspension 30 mL  30 mL Oral Q4H PRN [name], NP       . ARIPiprazole (ABILIFY) tablet 15 mg  15 mg Oral QHS [name], NP   15 mg at 06/27/14 2205  . atorvastatin (LIPITOR) tablet 10 mg  10 mg Oral Daily [name], NP   10 mg at 06/28/14 0820  . colchicine tablet 0.6 mg  0.6 mg Oral BID [name], NP   0.6 mg at 06/28/14 0820  . divalproex (DEPAKOTE ER) 24 hr tablet 1,000 mg  1,000 mg Oral BID PC [name], NP   1,000 mg at 06/28/14 0820  . furosemide (LASIX) tablet 60 mg  60 mg Oral BID [name], NP   60 mg at 06/27/14 1809  . glipiZIDE (GLUCOTROL XL) 24 hr tablet 10 mg  10 mg Oral BID AC [name], NP   10 mg at 06/28/14 0981  . guaiFENesin (MUCINEX) 12 hr tablet 600 mg  600 mg Oral BID Jourdyn Ferrin   600 mg at 06/28/14 0820  . haloperidol lactate (HALDOL) injection 5 mg  5 mg Intramuscular Q6H PRN Thresa Ross, MD   5 mg at 06/25/14 2234  . hydrALAZINE (APRESOLINE) tablet 50 mg  50 mg Oral 4 times per day [name], NP   50 mg at 06/28/14 1914  . insulin aspart (novoLOG) injection 0-20 Units  0-20 Units Subcutaneous TID WC Kerry Hough, PA-C   3 Units at 06/27/14 1716  . insulin aspart (novoLOG) injection 0-5 Units  0-5 Units Subcutaneous QHS Kerry Hough, PA-C   2 Units at 06/26/14 2302  . insulin aspart (novoLOG) injection 3 Units  3 Units Subcutaneous TID WC [name], NP   3 Units at 06/27/14 1717  . LORazepam (ATIVAN) tablet 0.5 mg  0.5 mg Oral BID [name], NP   0.5 mg at 06/28/14 0820  . LORazepam (ATIVAN) tablet 2 mg  2 mg Oral Q6H PRN Kerry Hough, PA-C      . losartan (COZAAR) tablet 100 mg  100 mg Oral Daily [name], NP   100 mg at 06/28/14 0820  . magnesium hydroxide (MILK OF MAGNESIA) suspension 30 mL  30 mL Oral Daily PRN [name], NP      . metoprolol succinate (TOPROL-XL) 24 hr tablet 200 mg  200 mg Oral Daily [name], NP   200 mg at 06/28/14 0820  . traZODone (DESYREL) tablet 200 mg  200 mg Oral QHS [name], NP   200 mg at 06/26/14 2243    Lab Results:  Results for orders placed  during the hospital encounter of 06/21/14 (from the past 48 hour(s))  GLUCOSE, CAPILLARY     Status: Abnormal   Collection Time    06/26/14 11:25 AM      Result Value Ref Range   Glucose-Capillary 175 (*) 70 - 99 mg/dL   Comment 1 Documented in Chart     Comment 2 Notify RN    GLUCOSE, CAPILLARY     Status: Abnormal   Collection Time    06/26/14  4:57 PM      Result Value Ref Range   Glucose-Capillary 185 (*) 70 - 99 mg/dL  GLUCOSE, CAPILLARY     Status: Abnormal   Collection Time    06/26/14  8:40 PM  Result Value Ref Range   Glucose-Capillary 216 (*) 70 - 99 mg/dL  GLUCOSE, CAPILLARY     Status: Abnormal   Collection Time    06/27/14  6:26 AM      Result Value Ref Range   Glucose-Capillary 135 (*) 70 - 99 mg/dL  GLUCOSE, CAPILLARY     Status: Abnormal   Collection Time    06/27/14 11:32 AM      Result Value Ref Range   Glucose-Capillary 195 (*) 70 - 99 mg/dL   Comment 1 Documented in Chart     Comment 2 Notify RN    GLUCOSE, CAPILLARY     Status: Abnormal   Collection Time    06/27/14  4:59 PM      Result Value Ref Range   Glucose-Capillary 136 (*) 70 - 99 mg/dL  GLUCOSE, CAPILLARY     Status: Abnormal   Collection Time    06/27/14  9:03 PM      Result Value Ref Range   Glucose-Capillary 137 (*) 70 - 99 mg/dL  GLUCOSE, CAPILLARY     Status: Abnormal   Collection Time    06/28/14  6:20 AM      Result Value Ref Range   Glucose-Capillary 66 (*) 70 - 99 mg/dL  VALPROIC ACID LEVEL     Status: Abnormal   Collection Time    06/28/14  6:23 AM      Result Value Ref Range   Valproic Acid Lvl 49.0 (*) 50.0 - 100.0 ug/mL   Comment: [facility]  GLUCOSE, CAPILLARY     Status: Abnormal   Collection Time    06/28/14  7:04 AM      Result Value Ref Range   Glucose-Capillary 105 (*) 70 - 99 mg/dL    Physical Findings: AIMS: Facial and Oral Movements Muscles of Facial Expression: None, normal Lips and Perioral Area: None, normal Jaw: None,  normal Tongue: None, normal,Extremity Movements Upper (arms, wrists, hands, fingers): None, normal Lower (legs, knees, ankles, toes): None, normal, Trunk Movements Neck, shoulders, hips: None, normal, Overall Severity Severity of abnormal movements (highest score from questions above): None, normal Incapacitation due to abnormal movements: None, normal Patient's awareness of abnormal movements (rate only patient's report): No Awareness, Dental Status Current problems with teeth and/or dentures?: No Does patient usually wear dentures?: No  CIWA:    COWS:     Treatment Plan Summary: Daily contact with patient to assess and evaluate symptoms and progress in treatment Medication management Plan: 1. Continue crisis management and stabilization. 2. Medication management to reduce current symptoms to base line and improve the patient's overall level of functioning: -Continue Depakote 1000mg  po BID for mood stabilization. -Increase Abilify to 10mg  po bid for delusions/ mood. 3. Treat health problems as indicated. 4. Develop treatment plan to decrease risk of relapse upon discharge and the need for  readmission. 5. Psycho-social education regarding relapse prevention and self care. 6. Health care follow up as needed for medical problems. Continue Glipizide 10 mg BID, Novolog SSI for treatment of Type 2 Diabetes. Start Novolog 3 units TID with meals for elevated blood sugars. Monitor CBG's four times daily. Continue Allopurinol 300 mg daily, Colchicine 0.6 mg BID for treatment of gout.  Patient using wheelchair at this time due to unsteady gait.  7. [facility]. 8. Renew 1:1 observation due to unsteady gait, intrusiveness, and easily agitated behaviors  9. Depakote level on 07/01/14  Medical Decision Making Problem Points:  Established problem, worsening (2), Review of last therapy session (1) and Review of psycho-social stressors  (1) Data Points:  Review or order clinical lab tests (1) Review or order medicine tests (1) Review of medication regiment & side effects (2)  I certify that inpatient services furnished can reasonably be expected to improve the patient's condition.   Thedore Mins, MD 06/28/2014, 10:58 AM

## 2014-06-28 NOTE — Progress Notes (Signed)
Patient ID: Christian [name], male   DOB: [birth year], 63 y.o.   MRN: [id number] 1-1 Monitoring Note- D. Patient remains 1.1. Monitoring for high fall risk. Pt is currently resting quietly and is no acute distress. A. Staff at bedside. R. Patient is safe. Will continue to monitor as ordered for safety.

## 2014-06-28 NOTE — [facility] Group Notes (Signed)
[facility] LCSW Group Therapy  06/28/2014 2:30 PM  Type of Therapy:  Group Therapy  Participation Level:  Did Not Attend- Patient was in room sleeping.     PICKETT JR, Emilly Lavey C 06/28/2014, 2:30 PM

## 2014-06-28 NOTE — Progress Notes (Signed)
Patient ID: Christian [name], male   DOB: 27-Jan-1951, 63 y.o.   MRN: [id number] 1-1 Monitoring Note- D. Patient remains 1.1. Monitoring for high fall risk. Pt is currently eating dinner, in dayroom and in no acute distress.. A. Staff at bedside. R. Patient is safe. Will continue to monitor as ordered for safety.

## 2014-06-28 NOTE — Progress Notes (Signed)
Patient ID: Christian [name], male   DOB: 12/24/1950, 63 y.o.   MRN: [id number] 1-1 Monitoring Note - D. Patient remains on 1.1. Monitoring for High Fall Risk. Pt remains hypomanic, grandiose and labile. Christian [name] states ''Well you need to get in touch with Art the tech, I want to give him $ 50,000. I'm doing better, I'm ready to go home, I'm not manic anymore, I have the secret to weight loss and gout'' A. Medications given as ordered. Pt cooperative with staff redirection, but his mood remains labile. R. Patient is in no acute distress at this time. Will continue to monitor q 15 minutes for safety.

## 2014-06-28 NOTE — [facility] Group Notes (Signed)
[facility] LCSW Aftercare Discharge Planning Group Note   06/28/2014 9:57 AM  Participation Quality:  Active  Mood/Affect:  Guarded  Depression Rating:  Patient denies current depression  Anxiety Rating:  Patient denies current anxiety   Thoughts of Suicide:  No Will you contract for safety?   Yes  Current AVH:  No  Plan for Discharge/Comments:  Pt reports that he will return to his home with his wife upon discharge.   Transportation Means: Spouse  Supports: Spouse  PICKETT JR, Medea Deines C

## 2014-06-29 LAB — GLUCOSE, CAPILLARY
Glucose-Capillary: 91 mg/dL (ref 70–99)
Glucose-Capillary: 89 mg/dL (ref 70–99)
Glucose-Capillary: 118 mg/dL — ABNORMAL HIGH (ref 70–99)
Glucose-Capillary: 127 mg/dL — ABNORMAL HIGH (ref 70–99)

## 2014-06-29 NOTE — Progress Notes (Signed)
Patient ID: Christian [name], male   DOB: 28-Jul-1951, 63 y.o.   MRN: [id number] Psychoeducational Group Note  Date:  06/29/2014 Time:  0920  Group Topic/Focus:  healthy coping skills.   Participation Level: Did Not Attend  Participation Quality:  Not Applicable  Affect:  Not Applicable  Cognitive:  Not Applicable  Insight:  Not Applicable  Engagement in Group: Not Applicable  Additional Comments:  Did not attend.   Valente David 06/29/2014, 11:09 AM

## 2014-06-29 NOTE — Progress Notes (Signed)
Patient ID: Christian [name], male   DOB: 09/18/1951, 63 y.o.   MRN: [id number] Psychoeducational Group Note  Date:  06/29/2014 Time:  0900  Group Topic/Focus:  inventory group   Participation Level: Did Not Attend  Participation Quality:  Not Applicable  Affect:  Not Applicable  Cognitive:  Not Applicable  Insight:  Not Applicable  Engagement in Group: Not Applicable  Additional Comments:  Did not attend.   Valente David 06/29/2014, 11:09 AM

## 2014-06-29 NOTE — Progress Notes (Signed)
Patient ID: Christian [name], male   DOB: June 23, 1951, 63 y.o.   MRN: [id number] 1-1 Monitoring Note- D. Patient remains 1.1. Monitoring for high fall risk/safety. Pt is currently resting quietly and is no acute distress. A. Staff at bedside. R. Patient is safe. Will continue to monitor as ordered for safety.

## 2014-06-29 NOTE — Progress Notes (Signed)
Patient ID: Christian [name], male   DOB: 03/08/1951, 63 y.o.   MRN: [id number] D)  Has been resting quietly tonight, eyes closed, resp reg, unlabored, wearing CPAP mask, no c/o's voiced.  Heat packs were placed on his right shoulder as well as his ankles per request. He agreed to take ativan 2 mg po., and he was able to doze off. A)  Remains on 1:1 obs for safety with mht at bedside. R)  Safety maintained.

## 2014-06-29 NOTE — Progress Notes (Signed)
Patient ID: Christian [name], male   DOB: [birth year], 63 y.o.   MR[facility] [id number] [facility]  06/29/2014 2:59 PM Christian [name]  MRN:  [id number] Subjective:  Patient states: "Nothing is wrong with me, I don't need to be here"  Objective:: Patient was seen in his room this afternoon.  He is still on one to one safety check for his safety and safety of others and to prevent falls.  Patient lacks insight into his mental illness and kept asking this Clinical research associatewriter when she will let him go home.  Patient strongly believes that his wife brought him to the hospital after an argument with her over cable price.  Patient repeatedly stated that his only problem is his gout pain.   Patient denied having anger issues and stated that he is compliant with his medications.  He stated that he love participating in group therapy but one patient in the group gets him angry.  Patient wanted to get out of his wheel chair and walk but was instructed by his sitter to sit down for his safety.  He denied SI/HI/AVH and stated "If anybody aggravates me I will react"  We will continue monitoring patient , offer him his medications and continue to provide safety.  Diagnosis:   DSM5: Schizophrenia Disorders:  Delusional Disorder (297.1) Obsessive-Compulsive Disorders:   Trauma-Stressor Disorders:   Substance/Addictive Disorders:   Depressive Disorders:  Disruptive Mood Dysregulation Disorder (296.99) Total Time spent with patient: 20 minutes  Axis I: Bipolar affective disorder, current episode manic with psychotic symptoms  ADL's:  Impaired  Sleep: Fair  Appetite:  Fair  Suicidal Ideation:  Plan:  denies Intent:  denies Homicidal Ideation:  Plan:  denies  Intent:  denies AEB (as evidenced by):  Psychiatric Specialty Exam: Physical Exam  Vitals reviewed. Musculoskeletal: He exhibits edema.  Psychiatric: His affect is labile. His speech is rapid and/or pressured and tangential. He is aggressive and combative.  Thought content is delusional. Cognition and memory are normal. He expresses impulsivity.    Review of Systems  Constitutional: Negative.   HENT: Negative.   Eyes: Negative.   Respiratory: Negative.   Cardiovascular: Positive for leg swelling (Improved with increase in lasix dosage ).  Gastrointestinal: Negative.   Genitourinary: Negative.   Musculoskeletal: Positive for joint pain.  Skin: Negative.   Neurological: Negative.   Endo/Heme/Allergies: Negative.   Psychiatric/Behavioral: Negative for suicidal ideas, hallucinations and substance abuse. The patient is nervous/anxious and has insomnia.     Blood pressure 146/94, pulse 85, temperature 98.5 F (36.9 C), temperature source Oral, resp. rate 16, height 6' (1.829 m), weight 124.286 kg (274 lb), SpO2 99.00%.Body mass index is 37.15 kg/(m^2).  General Appearance: Moderately groomed, alert  Eye Contact::  Fair  Speech:  Loud  Volume:  Increased  Mood:  Angry, Irritable   Affect:  Labile  Thought Process:  Circumstantial and Disorganized  Orientation:  Full (Time, Place, and Person)  Thought Content:  Rumination  Suicidal Thoughts:  No  Homicidal Thoughts:  No  Memory:  Immediate;   Fair Recent;   Fair  Judgement:  Poor  Insight:  Shallow  Psychomotor Activity:  Decreased  Concentration:  Fair  Recall:  [employer]Fair  Fund of Knowledge:Fair  Language: Fair  Akathisia:  Negative  Handed:  Right  AIMS (if indicated):     Assets:  Desire for Improvement Housing Social Support  Sleep:  Number of Hours: 4.25   Musculoskeletal: Strength & Muscle Tone: within normal limits  Gait & Station: On wheelchair Patient leans: Backward  Current Medications: Current Facility-Administered Medications  Medication Dose Route Frequency Provider Last Rate Last Dose  . acetaminophen (TYLENOL) tablet 650 mg  650 mg Oral Q6H PRN [name], NP   650 mg at 06/29/14 0435  . allopurinol (ZYLOPRIM) tablet 300 mg  300 mg Oral Daily [name], NP    300 mg at 06/29/14 0750  . alum & mag hydroxide-simeth (MAALOX/MYLANTA) 200-200-20 MG/5ML suspension 30 mL  30 mL Oral Q4H PRN [name], NP      . ARIPiprazole (ABILIFY) tablet 10 mg  10 mg Oral BID PC Denna Fryberger   10 mg at 06/29/14 0749  . atorvastatin (LIPITOR) tablet 10 mg  10 mg Oral Daily [name], NP   10 mg at 06/29/14 0750  . colchicine tablet 0.6 mg  0.6 mg Oral BID [name], NP   0.6 mg at 06/29/14 0749  . divalproex (DEPAKOTE ER) 24 hr tablet 1,000 mg  1,000 mg Oral BID PC [name], NP   1,000 mg at 06/29/14 0749  . furosemide (LASIX) tablet 60 mg  60 mg Oral BID [name], NP   60 mg at 06/29/14 0749  . glipiZIDE (GLUCOTROL XL) 24 hr tablet 10 mg  10 mg Oral BID AC [name], NP   10 mg at 06/29/14 0645  . guaiFENesin (MUCINEX) 12 hr tablet 600 mg  600 mg Oral BID Naji Mehringer   600 mg at 06/29/14 0751  . haloperidol lactate (HALDOL) injection 5 mg  5 mg Intramuscular Q6H PRN Thresa Ross, MD   5 mg at 06/29/14 0754  . hydrALAZINE (APRESOLINE) tablet 50 mg  50 mg Oral 4 times per day [name], NP   50 mg at 06/29/14 1259  . insulin aspart (novoLOG) injection 0-20 Units  0-20 Units Subcutaneous TID WC Kerry Hough, PA-C   3 Units at 06/28/14 1735  . insulin aspart (novoLOG) injection 0-5 Units  0-5 Units Subcutaneous QHS Kerry Hough, PA-C   2 Units at 06/26/14 2302  . insulin aspart (novoLOG) injection 3 Units  3 Units Subcutaneous TID WC [name], NP   3 Units at 06/28/14 1735  . LORazepam (ATIVAN) tablet 0.5 mg  0.5 mg Oral BID [name], NP   0.5 mg at 06/29/14 0749  . LORazepam (ATIVAN) tablet 2 mg  2 mg Oral Q6H PRN Kerry Hough, PA-C   2 mg at 06/29/14 0749  . losartan (COZAAR) tablet 100 mg  100 mg Oral Daily [name], NP   100 mg at 06/29/14 0750  . magnesium hydroxide (MILK OF MAGNESIA) suspension 30 mL  30 mL Oral Daily PRN [name], NP      . metoprolol succinate (TOPROL-XL) 24 hr tablet 200 mg  200 mg Oral Daily [name], NP    200 mg at 06/29/14 0750  . traZODone (DESYREL) tablet 200 mg  200 mg Oral QHS [name], NP   200 mg at 06/26/14 2243    Lab Results:  Results for orders placed during the hospital encounter of 06/21/14 (from the past 48 hour(s))  GLUCOSE, CAPILLARY     Status: Abnormal   Collection Time    06/27/14  4:59 PM      Result Value Ref Range   Glucose-Capillary 136 (*) 70 - 99 mg/dL  GLUCOSE, CAPILLARY     Status: Abnormal   Collection Time    06/27/14  9:03 PM  Result Value Ref Range   Glucose-Capillary 137 (*) 70 - 99 mg/dL  GLUCOSE, CAPILLARY     Status: Abnormal   Collection Time    06/28/14  6:20 AM      Result Value Ref Range   Glucose-Capillary 66 (*) 70 - 99 mg/dL  VALPROIC ACID LEVEL     Status: Abnormal   Collection Time    06/28/14  6:23 AM      Result Value Ref Range   Valproic Acid Lvl 49.0 (*) 50.0 - 100.0 ug/mL   Comment: Performed at [facility]  GLUCOSE, CAPILLARY     Status: Abnormal   Collection Time    06/28/14  7:04 AM      Result Value Ref Range   Glucose-Capillary 105 (*) 70 - 99 mg/dL  GLUCOSE, CAPILLARY     Status: Abnormal   Collection Time    06/28/14 11:38 AM      Result Value Ref Range   Glucose-Capillary 206 (*) 70 - 99 mg/dL  GLUCOSE, CAPILLARY     Status: Abnormal   Collection Time    06/28/14  3:16 PM      Result Value Ref Range   Glucose-Capillary 116 (*) 70 - 99 mg/dL  GLUCOSE, CAPILLARY     Status: Abnormal   Collection Time    06/28/14  5:30 PM      Result Value Ref Range   Glucose-Capillary 144 (*) 70 - 99 mg/dL  GLUCOSE, CAPILLARY     Status: Abnormal   Collection Time    06/28/14  9:41 PM      Result Value Ref Range   Glucose-Capillary 131 (*) 70 - 99 mg/dL  GLUCOSE, CAPILLARY     Status: None   Collection Time    06/29/14  6:25 AM      Result Value Ref Range   Glucose-Capillary 91  70 - 99 mg/dL  GLUCOSE, CAPILLARY     Status: None   Collection Time    06/29/14 12:05 PM      Result Value Ref Range    Glucose-Capillary 89  70 - 99 mg/dL   Comment 1 Documented in Chart     Comment 2 Notify RN      Physical Findings: AIMS: Facial and Oral Movements Muscles of Facial Expression: None, normal Lips and Perioral Area: None, normal Jaw: None, normal Tongue: None, normal,Extremity Movements Upper (arms, wrists, hands, fingers): None, normal Lower (legs, knees, ankles, toes): None, normal, Trunk Movements Neck, shoulders, hips: None, normal, Overall Severity Severity of abnormal movements (highest score from questions above): None, normal Incapacitation due to abnormal movements: None, normal Patient's awareness of abnormal movements (rate only patient's report): No Awareness, Dental Status Current problems with teeth and/or dentures?: No Does patient usually wear dentures?: No  CIWA:    COWS:     Treatment Plan Summary: Daily contact with patient to assess and evaluate symptoms and progress in treatment Medication management Plan: 1. Continue crisis management and stabilization. 2. Medication management to reduce current symptoms to base line and improve the patient's overall level of functioning: -Continue Depakote 1000mg  po BID for mood stabilization. -Increase Abilify to 10mg  po bid for delusions/ mood. 3. Treat health problems as indicated. 4. Develop treatment plan to decrease risk of relapse upon discharge and the need for  readmission. 5. Psycho-social education regarding relapse prevention and self care. 6. Health care follow up as needed for medical problems. Continue Glipizide 10 mg BID, Novolog SSI for treatment of  Type 2 Diabetes. Start Novolog 3 units TID with meals for elevated blood sugars. Monitor CBG's four times daily. Continue Allopurinol 300 mg daily, Colchicine 0.6 mg BID for treatment of gout.  Patient using wheelchair at this time due to unsteady gait.  7. Patient will be referred to [facility] for stabilization. 8. Renew 1:1 observation due to  unsteady gait, intrusiveness, and easily agitated behaviors  9. Depakote level on 07/01/14  Medical Decision Making Problem Points:  Established problem, worsening (2), Review of last therapy session (1) and Review of psycho-social stressors (1) Data Points:  Review or order clinical lab tests (1) Review or order medicine tests (1) Review of medication regiment & side effects (2)  I certify that inpatient services furnished can reasonably be expected to improve the patient's condition.   Dahlia Byes, Salena Saner,   PMHNP-BC 06/29/2014, 2:59 PM   Patient seen, evaluated and I agree with notes by Nurse Practitioner. Thedore Mins, MD

## 2014-06-29 NOTE — Progress Notes (Signed)
Patient ID: Christian [name], male   DOB: Oct 15, 1951, 63 y.o.   MRN: [id number] 1-1 Monitoring Note- D. Patient remains 1.1. Monitoring for high fall risk/safety. Pt is currently eating dinner and is in no acute distress. A. Staff at bedside. R. Patient is safe. Will continue to monitor as ordered for safety.

## 2014-06-29 NOTE — Progress Notes (Addendum)
The focus of this group is to help patients review their daily goal of treatment and discuss progress on daily workbooks. Pt attended the evening group session and responded to all discussion prompts from the Writer. Pt reported having had a good day, the highlight of which was his amazement that a peer was "able to shut me up by shutting me down. I haven't actually stopped talking since 1971, so this is a big moment for me." Pt did better this group than in previous groups at not talking over other people and not interrupting them. Pt's only request for the evening was that he be given options for his diabetic breakfast in the morning, which the Writer agreed to help him with. Pt's affect was flat.

## 2014-06-29 NOTE — Progress Notes (Signed)
D)  Has been in the dayroom most of the evening, attended group, has been pleasant for the most part, but occasionally irritable with his mht.  Has been singing to peers at times and watching tv.  His sense of humor has returned, and has been laughing at times with peers.  States is getting tired and ready for bed but remains talkative. A)  Will continue to monitor 1:1 for safety, d/t high fall risk. R)  Safety maintained.

## 2014-06-29 NOTE — Progress Notes (Signed)
Patient ID: Christian [name], male   DOB: 09/24/1951, 63 y.o.   MRN: [id number] 1-1 Monitoring Note - D. Patient remains on 1.1. Monitoring for High Fall Risk/Safety. Pt remains hypomanic, grandiose and labile, and easily agitated. He continues to be demanding and intrusive at times. He can be verbally aggressive at times, cursing at peers and staff. His gait remains unsteady at times. A. Medications given as ordered. Support and encouragement provided. Discussed above information with Dr. Jannifer Franklin. 1-1 staff remain at bedside. R. Patient is in no acute distress at this time. Will continue to monitor q 15 minutes for safety.

## 2014-06-29 NOTE — Progress Notes (Signed)
Patient ID: Christian [name], male   DOB: June 21, 1951, 63 y.o.   MRN: [id number] D)  Has been in the dayroom most of the evening, dozed off through part of group, but woke up long enough to respond, stated he was tired and ready for bed.  Meds were taken to him, and he went to bed shortly after group.  Has been less intrusive this evening, cooperative.  Currently in bed and seems to be nearly asleep, requested and given heat packs for his shoulders as well as tylenol. A)  Remains on 1:1 obs for safety, mht at bedside. R)  Safety maintained.

## 2014-06-29 NOTE — [facility] Group Notes (Signed)
[facility] Group Notes: (Clinical Social Work)   06/29/2014      Type of Therapy:  Group Therapy   Participation Level:  Did Not Attend    Ambrose Mantle, LCSW 06/29/2014, 4:39 PM

## 2014-06-30 LAB — GLUCOSE, CAPILLARY
Glucose-Capillary: 67 mg/dL — ABNORMAL LOW (ref 70–99)
Glucose-Capillary: 106 mg/dL — ABNORMAL HIGH (ref 70–99)
Glucose-Capillary: 100 mg/dL — ABNORMAL HIGH (ref 70–99)
Glucose-Capillary: 124 mg/dL — ABNORMAL HIGH (ref 70–99)

## 2014-06-30 MED ORDER — IBUPROFEN 200 MG PO TABS
600.0000 mg | ORAL_TABLET | Freq: Four times a day (QID) | ORAL | Status: DC | PRN
  Administered 2014-06-30 – 2014-07-01 (×2): 600 mg via ORAL
  Filled 2014-06-30: qty 3

## 2014-06-30 MED ORDER — IBUPROFEN 600 MG PO TABS
ORAL_TABLET | ORAL | Status: AC
  Filled 2014-06-30: qty 1

## 2014-06-30 NOTE — Progress Notes (Addendum)
Patient ID: Christian [name], male   DOB: Sep 14, 1951, 63 y.o.   MRN: [id number] 1-1 Monitoring Note - D. Patient remains on 1.1. Monitoring for High Fall Risk/Safety. Pt remains hypomanic, grandiose and labile, and easily agitated. Jesua continues to threaten at times stating ''I'm going to bust up out of here. I'm ready to go. Christian [name] continues to be demanding and intrusive at times. He can be verbally aggressive at times, cursing at peers and staff. His gait remains unsteady at times. A. Medications given as ordered. Support and encouragement provided. Discussed above information with Dr. Jannifer Franklin. 1-1 staff remain at bedside. R. Patient is in no acute distress at this time, currently resting quietly. Will continue to monitor q 15 minutes for safety.

## 2014-06-30 NOTE — Progress Notes (Signed)
Patient ID: REEF ADAIR, male   DOB: [birth year], 63 y.o.   MRN: [id number] Psychoeducational Group Note  Date:  06/30/2014 Time:  0920  Group Topic/Focus:  healthy support systems.   Participation Level: Did Not Attend  Participation Quality:  Not Applicable  Affect:  Not Applicable  Cognitive:  Not Applicable  Insight:  Not Applicable  Engagement in Group: Not Applicable  Additional Comments:  Did not attend.   Valente David 06/30/2014, 10:14 AM

## 2014-06-30 NOTE — [facility] Group Notes (Signed)
[facility] Group Notes:  (Clinical Social Work)  06/30/2014   11:15am-12:00pm  Summary of Progress/Problems:  The main focus of today's process group was to listen to a variety of genres of music and to identify that different types of music provoke different responses.  The patient then was able to identify personally what was soothing for them, as well as energizing.  Handouts were used to record feelings evoked, as well as how patient can personally use this knowledge in sleep habits, with depression, and with other symptoms.  The patient expressed understanding of concepts, as well as knowledge of how each type of music affected him/her and how this can be used at home as a wellness/recovery tool.  He was sad that he missed the first part of group, as he enjoys music a great deal.  He sang along or clapped with much of the music he heard.  Type of Therapy:  Music Therapy   Participation Level:  Active  Participation Quality:  Attentive and Sharing  Affect:  Blunted  Cognitive:  Oriented  Insight:  Engaged  Engagement in Therapy:  Engaged  Modes of Intervention:   Activity, Exploration  Ambrose Mantle, LCSW 06/30/2014, 12:30pm

## 2014-06-30 NOTE — Progress Notes (Signed)
Patient ID: Christian [name], male   DOB: 1951/05/17, 63 y.o.   MRN: [id number] 1-1 Monitoring Note- D. Patient remains 1.1. Monitoring for high fall risk/safety. Pt is currently sitting in his room, and is in no acute distress. Gatorade given. He denies any acute concern A. Staff at bedside. R. Patient is safe. Will continue to monitor as ordered for safety.

## 2014-06-30 NOTE — Progress Notes (Signed)
Patient ID: Christian [name], male   DOB: 11-26-1951, 63 y.o.   MRN: [id number] D)  Has been up and about in his w/c this evening, seems easily irritated, but redirectable.  Was in his room at the beginning of the shift but went to group.  MHT reported that pt told him his feet have little sensation and it throws off his balance.  Feet and legs appear more swollen, pt admits to eating ham at meals.   Continue to c/o pain in his shoulders, was given tylenol and heat packs for comfort.  Was assisted into bed by mht, wearing CPAP mask, nearly asleep. A)  Remains on 1:1 obs for safety d/t fall risk, mht at bedside. R)  Remains safe on unit at this time.

## 2014-06-30 NOTE — Progress Notes (Signed)
Patient ID: Christian [name], male   DOB: November 24, 1951, 63 y.o.   MRN: [id number] Psychoeducational Group Note  Date:  06/30/2014 Time: 0900  Group Topic/Focus:  inventory group   Participation Level: Did Not Attend  Participation Quality:  Not Applicable  Affect:  Not Applicable  Cognitive:  Not Applicable  Insight:  Not Applicable  Engagement in Group: Not Applicable  Additional Comments:  Did not attend.   Valente David 06/30/2014, 10:13 AM

## 2014-06-30 NOTE — Progress Notes (Signed)
Patient ID: Christian [name], male   DOB: Feb 04, 1951, 63 y.o.   MRN: [id number] D)  Pt is awake and is sitting up in w/c writing and "working on paperwork".  Encouraged him to go back to bed abnd try to get more sleep, was given tylenol for c/o aching in shoulders and heat packs applied.  Stated would go back to bed after a snack.  Pleasant, humorous at times, remains grandiose, offering staff much money, etc if they had a need. Agreed to go back to bed when paperwork finished. A)  Remains on 1:1 obs for safety, mht at bedside assisting pt for safety  R)  Safety maintained.

## 2014-06-30 NOTE — Progress Notes (Signed)
Patient ID: Christian [name], male   DOB: Sep 08, 1951, 63 y.o.   MRN: [id number] [facility] MD Progress Note  06/30/2014 11:44 AM Christian [name] Rady  MRN:  [id number] Subjective:  Patient states: " Mr. A your questions aggravates me all the time, Stop asking me silly questions, I just want out of here.''  Objective:: Patient was seen and his chart was reviewed. Patient remains hypomanic, grandiose, aggressive, labile and easily agitated. Patient continues to make threatening remarks ''I'm going to bust up out of here. I'm ready to go. Christian [name] continues to be demanding, defiant, oppositional and intrusive. He is verbally aggressive at times, cursing at peers and staff. His gait remains unsteady at times. He is on  1-1 observation for safety.  Diagnosis:   DSM5: Schizophrenia Disorders:  Delusional Disorder (297.1) Obsessive-Compulsive Disorders:   Trauma-Stressor Disorders:   Substance/Addictive Disorders:   Depressive Disorders:  Disruptive Mood Dysregulation Disorder (296.99) Total Time spent with patient: 20 minutes  Axis I: Bipolar affective disorder, current episode manic with psychotic symptoms  ADL's:  Impaired  Sleep: Fair  Appetite:  Fair  Suicidal Ideation:  Plan:  denies Intent:  denies Homicidal Ideation:  Plan:  denies  Intent:  denies AEB (as evidenced by):  Psychiatric Specialty Exam: Physical Exam  Vitals reviewed. Musculoskeletal: He exhibits edema.  Psychiatric: His affect is labile. His speech is rapid and/or pressured and tangential. He is aggressive and combative. Thought content is delusional. Cognition and memory are normal. He expresses impulsivity.    Review of Systems  Constitutional: Negative.   HENT: Negative.   Eyes: Negative.   Respiratory: Negative.   Cardiovascular: Positive for leg swelling (Improved with increase in lasix dosage ).  Gastrointestinal: Negative.   Genitourinary: Negative.   Musculoskeletal: Positive for joint pain.  Skin: Negative.    Neurological: Negative.   Endo/Heme/Allergies: Negative.   Psychiatric/Behavioral: Negative for suicidal ideas, hallucinations and substance abuse. The patient is nervous/anxious and has insomnia.     Blood pressure 146/94, pulse 85, temperature 98.5 F (36.9 C), temperature source Oral, resp. rate 16, height 6' (1.829 m), weight 124.286 kg (274 lb), SpO2 99.00%.Body mass index is 37.15 kg/(m^2).  General Appearance: Moderately groomed, alert  Eye Contact::  Fair  Speech:  Loud  Volume:  Increased  Mood:  Angry, Irritable   Affect:  Labile  Thought Process:  Circumstantial and Disorganized  Orientation:  Full (Time, Place, and Person)  Thought Content:  Rumination  Suicidal Thoughts:  No  Homicidal Thoughts:  No  Memory:  Immediate;   Fair Recent;   Fair  Judgement:  Poor  Insight:  Shallow  Psychomotor Activity:  Decreased  Concentration:  Fair  Recall:  [employer] of Knowledge:Fair  Language: Fair  Akathisia:  Negative  Handed:  Right  AIMS (if indicated):     Assets:  Desire for Improvement Housing Social Support  Sleep:  Number of Hours: 5   Musculoskeletal: Strength & Muscle Tone: within normal limits Gait & Station: On wheelchair Patient leans: Backward  Current Medications: Current Facility-Administered Medications  Medication Dose Route Frequency Provider Last Rate Last Dose  . acetaminophen (TYLENOL) tablet 650 mg  650 mg Oral Q6H PRN [name], NP   650 mg at 06/30/14 0410  . allopurinol (ZYLOPRIM) tablet 300 mg  300 mg Oral Daily [name], NP   300 mg at 06/30/14 0752  . alum & mag hydroxide-simeth (MAALOX/MYLANTA) 200-200-20 MG/5ML suspension 30 mL  30 mL Oral Q4H PRN [name],  NP      . ARIPiprazole (ABILIFY) tablet 10 mg  10 mg Oral BID PC Zebbie Ace   10 mg at 06/30/14 0752  . atorvastatin (LIPITOR) tablet 10 mg  10 mg Oral Daily [name], NP   10 mg at 06/30/14 0752  . colchicine tablet 0.6 mg  0.6 mg Oral BID [name], NP   0.6 mg  at 06/30/14 6962  . divalproex (DEPAKOTE ER) 24 hr tablet 1,000 mg  1,000 mg Oral BID PC [name], NP   1,000 mg at 06/30/14 0752  . furosemide (LASIX) tablet 60 mg  60 mg Oral BID [name], NP   60 mg at 06/30/14 0752  . glipiZIDE (GLUCOTROL XL) 24 hr tablet 10 mg  10 mg Oral BID AC [name], NP   10 mg at 06/30/14 0751  . guaiFENesin (MUCINEX) 12 hr tablet 600 mg  600 mg Oral BID Kaiulani Sitton   600 mg at 06/30/14 0752  . haloperidol lactate (HALDOL) injection 5 mg  5 mg Intramuscular Q6H PRN Thresa Ross, MD   5 mg at 06/29/14 0754  . hydrALAZINE (APRESOLINE) tablet 50 mg  50 mg Oral 4 times per day [name], NP   50 mg at 06/29/14 2132  . insulin aspart (novoLOG) injection 0-20 Units  0-20 Units Subcutaneous TID WC Kerry Hough, PA-C   3 Units at 06/28/14 1735  . insulin aspart (novoLOG) injection 0-5 Units  0-5 Units Subcutaneous QHS Kerry Hough, PA-C   2 Units at 06/26/14 2302  . insulin aspart (novoLOG) injection 3 Units  3 Units Subcutaneous TID WC [name], NP   3 Units at 06/28/14 1735  . LORazepam (ATIVAN) tablet 0.5 mg  0.5 mg Oral BID [name], NP   0.5 mg at 06/30/14 0752  . LORazepam (ATIVAN) tablet 2 mg  2 mg Oral Q6H PRN Kerry Hough, PA-C   2 mg at 06/30/14 0751  . losartan (COZAAR) tablet 100 mg  100 mg Oral Daily [name], NP   100 mg at 06/30/14 0751  . magnesium hydroxide (MILK OF MAGNESIA) suspension 30 mL  30 mL Oral Daily PRN [name], NP      . metoprolol succinate (TOPROL-XL) 24 hr tablet 200 mg  200 mg Oral Daily [name], NP   200 mg at 06/30/14 0752  . traZODone (DESYREL) tablet 200 mg  200 mg Oral QHS [name], NP   200 mg at 06/29/14 2132    Lab Results:  Results for orders placed during the hospital encounter of 06/21/14 (from the past 48 hour(s))  GLUCOSE, CAPILLARY     Status: Abnormal   Collection Time    06/28/14  3:16 PM      Result Value Ref Range   Glucose-Capillary 116 (*) 70 - 99 mg/dL  GLUCOSE, CAPILLARY      Status: Abnormal   Collection Time    06/28/14  5:30 PM      Result Value Ref Range   Glucose-Capillary 144 (*) 70 - 99 mg/dL  GLUCOSE, CAPILLARY     Status: Abnormal   Collection Time    06/28/14  9:41 PM      Result Value Ref Range   Glucose-Capillary 131 (*) 70 - 99 mg/dL  GLUCOSE, CAPILLARY     Status: None   Collection Time    06/29/14  6:25 AM      Result Value Ref Range   Glucose-Capillary 91  70 -  99 mg/dL  GLUCOSE, CAPILLARY     Status: None   Collection Time    06/29/14 12:05 PM      Result Value Ref Range   Glucose-Capillary 89  70 - 99 mg/dL   Comment 1 Documented in Chart     Comment 2 Notify RN    GLUCOSE, CAPILLARY     Status: Abnormal   Collection Time    06/29/14  4:37 PM      Result Value Ref Range   Glucose-Capillary 118 (*) 70 - 99 mg/dL  GLUCOSE, CAPILLARY     Status: Abnormal   Collection Time    06/29/14  9:12 PM      Result Value Ref Range   Glucose-Capillary 127 (*) 70 - 99 mg/dL   Comment 1 Notify RN    GLUCOSE, CAPILLARY     Status: Abnormal   Collection Time    06/30/14  6:06 AM      Result Value Ref Range   Glucose-Capillary 67 (*) 70 - 99 mg/dL   Comment 1 Notify RN     Comment 2 Documented in Chart      Physical Findings: AIMS: Facial and Oral Movements Muscles of Facial Expression: None, normal Lips and Perioral Area: None, normal Jaw: None, normal Tongue: None, normal,Extremity Movements Upper (arms, wrists, hands, fingers): None, normal Lower (legs, knees, ankles, toes): None, normal, Trunk Movements Neck, shoulders, hips: None, normal, Overall Severity Severity of abnormal movements (highest score from questions above): None, normal Incapacitation due to abnormal movements: None, normal Patient's awareness of abnormal movements (rate only patient's report): No Awareness, Dental Status Current problems with teeth and/or dentures?: No Does patient usually wear dentures?: No  CIWA:    COWS:     Treatment Plan Summary: Daily  contact with patient to assess and evaluate symptoms and progress in treatment Medication management Plan: 1. Continue crisis management and stabilization. 2. Medication management to reduce current symptoms to base line and improve the patient's overall level of functioning: -Continue Depakote 1000mg  po BID for mood stabilization. -Continue  Abilify  10mg  po bid for delusions/ mood. 3. Treat health problems as indicated. 4. Develop treatment plan to decrease risk of relapse upon discharge and the need for  readmission. 5. Psycho-social education regarding relapse prevention and self care. 6. Health care follow up as needed for medical problems. Continue Glipizide 10 mg BID, Novolog SSI for treatment of Type 2 Diabetes. Start Novolog 3 units TID with meals for elevated blood sugars. Monitor CBG's four times daily. Continue Allopurinol 300 mg daily, Colchicine 0.6 mg BID for treatment of gout.  Patient using wheelchair at this time due to unsteady gait.  7. [facility]. 8. Renew 1:1 observation due to unsteady gait, intrusiveness, and easily agitated behaviors  9. Depakote level on 07/01/14  Medical Decision Making Problem Points:  Established problem, worsening (2), Review of last therapy session (1) and Review of psycho-social stressors (1) Data Points:  Review or order clinical lab tests (1) Review or order medicine tests (1) Review of medication regiment & side effects (2)  I certify that inpatient services furnished can reasonably be expected to improve the patient's condition.   Thedore MinsAkintayo, Cale Bethard, MD 06/30/2014, 11:44 AM

## 2014-07-01 ENCOUNTER — Encounter (HOSPITAL_COMMUNITY): Payer: Self-pay | Admitting: Internal Medicine

## 2014-07-01 DIAGNOSIS — E1165 Type 2 diabetes mellitus with hyperglycemia: Secondary | ICD-10-CM

## 2014-07-01 DIAGNOSIS — IMO0002 Reserved for concepts with insufficient information to code with codable children: Secondary | ICD-10-CM

## 2014-07-01 DIAGNOSIS — I10 Essential (primary) hypertension: Secondary | ICD-10-CM

## 2014-07-01 DIAGNOSIS — E118 Type 2 diabetes mellitus with unspecified complications: Secondary | ICD-10-CM

## 2014-07-01 DIAGNOSIS — R609 Edema, unspecified: Secondary | ICD-10-CM

## 2014-07-01 DIAGNOSIS — I369 Nonrheumatic tricuspid valve disorder, unspecified: Secondary | ICD-10-CM

## 2014-07-01 LAB — COMPREHENSIVE METABOLIC PANEL
ALT: 25 U/L (ref 0–53)
AST: 26 U/L (ref 0–37)
Albumin: 3.3 g/dL — ABNORMAL LOW (ref 3.5–5.2)
Alkaline Phosphatase: 59 U/L (ref 39–117)
Anion gap: 10 (ref 5–15)
BUN: 30 mg/dL — ABNORMAL HIGH (ref 6–23)
CO2: 30 mEq/L (ref 19–32)
Calcium: 9.3 mg/dL (ref 8.4–10.5)
Chloride: 96 mEq/L (ref 96–112)
Creatinine, Ser: 1.65 mg/dL — ABNORMAL HIGH (ref 0.50–1.35)
GFR calc Af Amer: 51 mL/min — ABNORMAL LOW (ref >90)
GFR calc non Af Amer: 44 mL/min — ABNORMAL LOW (ref >90)
Glucose, Bld: 72 mg/dL (ref 70–99)
Potassium: 4.5 mEq/L (ref 3.7–5.3)
Sodium: 136 mEq/L — ABNORMAL LOW (ref 137–147)
Total Bilirubin: 0.2 mg/dL — ABNORMAL LOW (ref 0.3–1.2)
Total Protein: 7.1 g/dL (ref 6.0–8.3)

## 2014-07-01 LAB — URINE MICROSCOPIC-ADD ON

## 2014-07-01 LAB — URINALYSIS, ROUTINE W REFLEX MICROSCOPIC
Bilirubin Urine: NEGATIVE
Glucose, UA: NEGATIVE mg/dL
Hgb urine dipstick: NEGATIVE
Ketones, ur: NEGATIVE mg/dL
Nitrite: NEGATIVE
Protein, ur: NEGATIVE mg/dL
Specific Gravity, Urine: 1.008 (ref 1.005–1.030)
Urobilinogen, UA: 0.2 mg/dL (ref 0.0–1.0)
pH: 5.5 (ref 5.0–8.0)

## 2014-07-01 LAB — GLUCOSE, CAPILLARY
Glucose-Capillary: 83 mg/dL (ref 70–99)
Glucose-Capillary: 91 mg/dL (ref 70–99)
Glucose-Capillary: 92 mg/dL (ref 70–99)
Glucose-Capillary: 58 mg/dL — ABNORMAL LOW (ref 70–99)
Glucose-Capillary: 104 mg/dL — ABNORMAL HIGH (ref 70–99)

## 2014-07-01 LAB — VALPROIC ACID LEVEL: Valproic Acid Lvl: 55.5 ug/mL (ref 50.0–100.0)

## 2014-07-01 LAB — TSH: TSH: 0.819 u[IU]/mL (ref 0.350–4.500)

## 2014-07-01 LAB — PRO B NATRIURETIC PEPTIDE: Pro B Natriuretic peptide (BNP): 52.2 pg/mL (ref 0–125)

## 2014-07-01 MED ORDER — FUROSEMIDE 80 MG PO TABS
80.0000 mg | ORAL_TABLET | Freq: Two times a day (BID) | ORAL | Status: DC
  Administered 2014-07-01 – 2014-07-16 (×30): 80 mg via ORAL
  Filled 2014-07-01 (×34): qty 1

## 2014-07-01 MED ORDER — METFORMIN HCL ER 500 MG PO TB24: 500.0000 mg | ORAL_TABLET | Freq: Every day | ORAL | Status: DC

## 2014-07-01 MED ORDER — GLIPIZIDE ER 5 MG PO TB24
7.5000 mg | ORAL_TABLET | Freq: Two times a day (BID) | ORAL | Status: DC
  Filled 2014-07-01 (×3): qty 1

## 2014-07-01 MED ORDER — ASPIRIN EC 81 MG PO TBEC
81.0000 mg | DELAYED_RELEASE_TABLET | Freq: Every day | ORAL | Status: DC
  Administered 2014-07-02 – 2014-07-16 (×15): 81 mg via ORAL
  Filled 2014-07-01 (×18): qty 1

## 2014-07-01 NOTE — Progress Notes (Signed)
Patient ID: Christian [name], male   DOB: January 06, 1951, 63 y.o.   MRN: [id number] D)  Is awake and up in w/c going to phlebotomy room for lab draw.  Slept at intervals, but is pleasant and cooperative this morning.  A)  Will continue to monitor for safety per !:1 ,R)  Safety maintained.

## 2014-07-01 NOTE — Progress Notes (Signed)
Echocardiogram 2D Echocardiogram has been performed.  Christian [name] 07/01/2014, 4:27 PM

## 2014-07-01 NOTE — Progress Notes (Addendum)
Hypoglycemic Event  CBG: 58  Treatment: 15 GM carbohydrate snack  Symptoms: None  Follow-up CBG: Time 2150 CBG Result:see results at 2155  104 cbg  Possible Reasons for Event: Unknown  Comments/MD notified: PA spencer simon    Christian [name]  Remember to initiate Hypoglycemia Order Set & complete

## 2014-07-01 NOTE — Progress Notes (Signed)
PHARMACIST - PHYSICIAN COMMUNICATION  CONCERNING:  METFORMIN SAFE ADMINISTRATION POLICY  RECOMMENDATION: Metformin has been placed on DISCONTINUE (rejected order) STATUS and should be reordered only after any of the conditions below are ruled out.  Current safety recommendations include avoiding metformin for a minimum of 48 hours after the patient's exposure to intravenous contrast media.  DESCRIPTION:  The Pharmacy Committee has adopted a policy that restricts the use of metformin in hospitalized patients until all the contraindications to administration have been ruled out. Specific contraindications are: [x]  Serum creatinine ? 1.5 for males []  Serum creatinine ? 1.4 for females []  Shock, acute MI, sepsis, hypoxemia, dehydration []  Planned administration of intravenous iodinated contrast media []  Heart Failure patients with low EF []  Acute or chronic metabolic acidosis (including DKA)      Charolotte Eke, PharmD, pager [phone]. 07/01/2014,9:52 PM.
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
## 2014-07-01 NOTE — Progress Notes (Signed)
Adult Psychoeducational Group Note  Date:  07/01/2014 Time:  6:29 PM  Group Topic/Focus:  Wellness Toolbox:   The focus of this group is to discuss various aspects of wellness, balancing those aspects and exploring ways to increase the ability to experience wellness.  Patients will create a wellness toolbox for use upon discharge.  Participation Level:  Active  Participation Quality:  Intrusive and Redirectable  Affect:  Appropriate  Cognitive:  Lacking  Insight: Lacking  Engagement in Group:  Distracting, Monopolizing and Off Topic  Modes of Intervention:  Education and Support  Additional Comments:  Pt participated in group. Pt was intrusive but redirectable.  Marquis Lunch, Suhas Estis 07/01/2014, 6:29 PM

## 2014-07-01 NOTE — Progress Notes (Signed)
1:1 observation note pt observed lying in bed with sitter present. Pt refused to elevate lower extremities as ordered per MD. Writer informed pt that he will have to be transported to [facility] to have an Echocardiogram performed. Pt agreed to procedure. Writer call [facility] to transport pt. Pt remains on 1:1 observation for safety. Pt safety maintained at this time.

## 2014-07-01 NOTE — [facility] Group Notes (Signed)
[facility] LCSW Group Therapy  07/01/2014 1:15 pm  Type of Therapy: Process Group Therapy  Participation Level:  Active  Participation Quality:  Invited.  Chose not to attend  Summary of Progress/Problems: Today's group addressed the issue of overcoming obstacles.  Patients were asked to identify their biggest obstacle post d/c that stands in the way of their on-going success, and then problem solve as to how to manage this.  Ida Rogue 07/01/2014   3:42 PM

## 2014-07-01 NOTE — Progress Notes (Signed)
Patient ID: Christian [name], male   DOB: [birth year], 64 y.o.   MRN: [id number] D)  Was awake and sitting up in w/c, requested pain meds for shoulders, went into the bqathroom per w/c.  Was given Ativan 2 mg po to help him rest,  and motrin 600 mg for shoulder pain.  Was singing softly but cooperative and pleasant, no other c/o's. A) Remains on 1:1 obs for safety, mht at bedside. R)  Safety maintained.

## 2014-07-01 NOTE — Progress Notes (Signed)
Pt is pleasant on approach   He is somewhat disorganized in his thinking but is easily redirected   He continues to be a high fall risk due to swelling in his feet and unsteady gait   Pt remains on a 1:1   Verbal support given   Medications administered and effectiveness monitored    Pt is safe at present time

## 2014-07-01 NOTE — [facility] Group Notes (Signed)
[facility] LCSW Aftercare Discharge Planning Group Note   07/01/2014 10:34 AM  Participation Quality:  Invited.  Chose not to attend.    Christian [name] B

## 2014-07-01 NOTE — Progress Notes (Signed)
[facility] MD Progress Note  07/01/2014 10:43 AM Christian [name]  MRN:  [id number] Subjective:   " My feet are swelling''  Objective::  Patient seen and his chart eviewed.  Patient is complaining of bilateral feet swelling .  He has difficulty walking.  He is on one-to-one watch for safety and fall precaution.  He remains very hypomanic, grandiose and easily irritable.  His speech is rambling and he gets easily irritable.  He continues to threaten and demanding.  He has limited participation in the groups.  He remained paranoid and delusional.  His attention and concentration is poor.  He admitted that he threatened his wife but did not provide the details.  He is taking his medication with some encouragement.  His Depakote level drawn today and level is 55.5   Diagnosis:   DSM5: Schizophrenia Disorders:  Delusional Disorder (297.1) Obsessive-Compulsive Disorders:   Trauma-Stressor Disorders:   Substance/Addictive Disorders:   Depressive Disorders:  Disruptive Mood Dysregulation Disorder (296.99) Total Time spent with patient: 20 minutes  Axis I: Bipolar affective disorder, current episode manic with psychotic symptoms  ADL's:  Impaired  Sleep: Fair  Appetite:  Fair  Suicidal Ideation:  Plan:  denies Intent:  denies Homicidal Ideation:  Plan:  denies  Intent:  denies AEB (as evidenced by):  Psychiatric Specialty Exam: Physical Exam  Vitals reviewed. Musculoskeletal: He exhibits edema.  Psychiatric: His affect is labile. His speech is rapid and/or pressured and tangential. He is aggressive and combative. Thought content is delusional. Cognition and memory are normal. He expresses impulsivity.    Review of Systems  Constitutional: Negative.   HENT: Negative.   Eyes: Negative.   Respiratory: Negative.   Cardiovascular: Positive for leg swelling (Improved with increase in lasix dosage ).  Gastrointestinal: Negative.   Genitourinary: Negative.   Musculoskeletal: Positive for  joint pain.  Skin: Negative.   Neurological: Negative.        Fall risk  Endo/Heme/Allergies: Negative.   Psychiatric/Behavioral: Negative for suicidal ideas, hallucinations and substance abuse. The patient is nervous/anxious and has insomnia.     Blood pressure 113/74, pulse 76, temperature 98.1 F (36.7 C), temperature source Oral, resp. rate 18, height 6' (1.829 m), weight 274 lb (124.286 kg), SpO2 99.00%.Body mass index is 37.15 kg/(m^2).  General Appearance: Moderately groomed, alert  Eye Contact::  Fair  Speech:  Loud  Volume:  Increased  Mood:  Angry, Irritable   Affect:  Labile  Thought Process:  Circumstantial and Disorganized  Orientation:  Full (Time, Place, and Person)  Thought Content:  Rumination  Suicidal Thoughts:  No  Homicidal Thoughts:  No  Memory:  Immediate;   Fair Recent;   Fair  Judgement:  Poor  Insight:  Shallow  Psychomotor Activity:  Decreased  Concentration:  Fair  Recall:  [employer] of Knowledge:Fair  Language: Fair  Akathisia:  Negative  Handed:  Right  AIMS (if indicated):     Assets:  Desire for Improvement Housing Social Support  Sleep:  Number of Hours: 3.75   Musculoskeletal: Strength & Muscle Tone: within normal limits Gait & Station: On wheelchair Patient leans: Backward  Current Medications: Current Facility-Administered Medications  Medication Dose Route Frequency Provider Last Rate Last Dose  . acetaminophen (TYLENOL) tablet 650 mg  650 mg Oral Q6H PRN [name], NP   650 mg at 06/30/14 2143  . allopurinol (ZYLOPRIM) tablet 300 mg  300 mg Oral Daily [name], NP   300 mg at 07/01/14 0818  .  alum & mag hydroxide-simeth (MAALOX/MYLANTA) 200-200-20 MG/5ML suspension 30 mL  30 mL Oral Q4H PRN [name], NP      . ARIPiprazole (ABILIFY) tablet 10 mg  10 mg Oral BID PC [name]   10 mg at 07/01/14 0819  . atorvastatin (LIPITOR) tablet 10 mg  10 mg Oral Daily [name], NP   10 mg at 07/01/14 0819  . colchicine tablet  0.6 mg  0.6 mg Oral BID [name], NP   0.6 mg at 07/01/14 0817  . divalproex (DEPAKOTE ER) 24 hr tablet 1,000 mg  1,000 mg Oral BID PC [name], NP   1,000 mg at 07/01/14 0818  . furosemide (LASIX) tablet 60 mg  60 mg Oral BID [name], NP   60 mg at 07/01/14 0817  . glipiZIDE (GLUCOTROL XL) 24 hr tablet 10 mg  10 mg Oral BID AC [name], NP   10 mg at 07/01/14 0818  . guaiFENesin (MUCINEX) 12 hr tablet 600 mg  600 mg Oral BID [name]   600 mg at 07/01/14 0818  . haloperidol lactate (HALDOL) injection 5 mg  5 mg Intramuscular Q6H PRN Thresa Ross, MD   5 mg at 06/29/14 0754  . hydrALAZINE (APRESOLINE) tablet 50 mg  50 mg Oral 4 times per day [name], NP   50 mg at 06/30/14 2151  . ibuprofen (ADVIL,MOTRIN) tablet 600 mg  600 mg Oral Q6H PRN [name]   600 mg at 07/01/14 0222  . insulin aspart (novoLOG) injection 0-20 Units  0-20 Units Subcutaneous TID WC Kerry Hough, PA-C   3 Units at 06/28/14 1735  . insulin aspart (novoLOG) injection 0-5 Units  0-5 Units Subcutaneous QHS Kerry Hough, PA-C   2 Units at 06/26/14 2302  . insulin aspart (novoLOG) injection 3 Units  3 Units Subcutaneous TID WC [name], NP   3 Units at 06/28/14 1735  . LORazepam (ATIVAN) tablet 0.5 mg  0.5 mg Oral BID [name], NP   0.5 mg at 07/01/14 0819  . LORazepam (ATIVAN) tablet 2 mg  2 mg Oral Q6H PRN Kerry Hough, PA-C   2 mg at 07/01/14 0221  . losartan (COZAAR) tablet 100 mg  100 mg Oral Daily [name], NP   100 mg at 07/01/14 0818  . magnesium hydroxide (MILK OF MAGNESIA) suspension 30 mL  30 mL Oral Daily PRN [name], NP      . metoprolol succinate (TOPROL-XL) 24 hr tablet 200 mg  200 mg Oral Daily [name], NP   200 mg at 06/30/14 0752  . traZODone (DESYREL) tablet 200 mg  200 mg Oral QHS [name], NP   200 mg at 06/30/14 2143    Lab Results:  Results for orders placed during the hospital encounter of 06/21/14 (from the past 48 hour(s))  GLUCOSE, CAPILLARY      Status: None   Collection Time    06/29/14 12:05 PM      Result Value Ref Range   Glucose-Capillary 89  70 - 99 mg/dL   Comment 1 Documented in Chart     Comment 2 Notify RN    GLUCOSE, CAPILLARY     Status: Abnormal   Collection Time    06/29/14  4:37 PM      Result Value Ref Range   Glucose-Capillary 118 (*) 70 - 99 mg/dL  GLUCOSE, CAPILLARY     Status: Abnormal   Collection Time    06/29/14  9:12 PM      Result Value Ref Range   Glucose-Capillary 127 (*) 70 - 99 mg/dL   Comment 1 Notify RN    GLUCOSE, CAPILLARY     Status: Abnormal   Collection Time    06/30/14  6:06 AM      Result Value Ref Range   Glucose-Capillary 67 (*) 70 - 99 mg/dL   Comment 1 Notify RN     Comment 2 Documented in Chart    GLUCOSE, CAPILLARY     Status: Abnormal   Collection Time    06/30/14 12:07 PM      Result Value Ref Range   Glucose-Capillary 106 (*) 70 - 99 mg/dL  GLUCOSE, CAPILLARY     Status: Abnormal   Collection Time    06/30/14  4:45 PM      Result Value Ref Range   Glucose-Capillary 100 (*) 70 - 99 mg/dL  GLUCOSE, CAPILLARY     Status: Abnormal   Collection Time    06/30/14  9:43 PM      Result Value Ref Range   Glucose-Capillary 124 (*) 70 - 99 mg/dL  GLUCOSE, CAPILLARY     Status: None   Collection Time    07/01/14  5:54 AM      Result Value Ref Range   Glucose-Capillary 83  70 - 99 mg/dL  VALPROIC ACID LEVEL     Status: None   Collection Time    07/01/14  6:19 AM      Result Value Ref Range   Valproic Acid Lvl 55.5  50.0 - 100.0 ug/mL   Comment: Performed at [facility]    Physical Findings: AIMS: Facial and Oral Movements Muscles of Facial Expression: None, normal Lips and Perioral Area: None, normal Jaw: None, normal Tongue: None, normal,Extremity Movements Upper (arms, wrists, hands, fingers): None, normal Lower (legs, knees, ankles, toes): None, normal, Trunk Movements Neck, shoulders, hips: None, normal, Overall Severity Severity of abnormal movements  (highest score from questions above): None, normal Incapacitation due to abnormal movements: None, normal Patient's awareness of abnormal movements (rate only patient's report): No Awareness, Dental Status Current problems with teeth and/or dentures?: No Does patient usually wear dentures?: No  CIWA:    COWS:     Treatment Plan Summary: Daily contact with patient to assess and evaluate symptoms and progress in treatment Medication management Plan: 1. Continue crisis management and stabilization. 2. Medication management to reduce current symptoms to base line and improve the patient's overall level of functioning: -Continue Depakote 1000mg  po BID for mood stabilization. -Continue  Abilify  10mg  po bid for delusions/ mood. 3. Treat health problems as indicated. 4. Develop treatment plan to decrease risk of relapse upon discharge and the need for  readmission. 5. Psycho-social education regarding relapse prevention and self care. 6. Health care follow up as needed for medical problems. Continue Glipizide 10 mg BID, Novolog SSI for treatment of Type 2 Diabetes. Start Novolog 3 units TID with meals for elevated blood sugars. Monitor CBG's four times daily. Continue Allopurinol 300 mg daily, Colchicine 0.6 mg BID for treatment of gout.  Patient using wheelchair at this time due to unsteady gait.  We will call internal medicine consult because he has bilateral feet swelling but is getting worse.  He is on Lasix however it is not helping.   7. Patient will be [facility] to [facility] for stabilization. 8. Renew 1:1 observation due to unsteady gait, intrusiveness, and easily agitated behaviors  9. His Depakote level is 55.5 .    Medical Decision Making Problem Points:  Established problem, worsening (2), Review of last therapy session (1) and Review of psycho-social stressors (1) Data Points:  Review or order clinical lab tests (1) Review or order medicine tests (1) Review of  medication regiment & side effects (2)  I certify that inpatient services furnished can reasonably be expected to improve the patient's condition.   ARFEEN,SYED T., MD 07/01/2014, 10:43 AM

## 2014-07-01 NOTE — Progress Notes (Signed)
Observation note: Pt observed sitting in his room with sitter present. Pt continues to be argumentative with staff and other pt on the milieu. Pt difficult to redirect throughout the day. Pt continues to be delusional and grandiose. Pt uncooperative with treatment plan and refuses to elevate his lower extremities as instructed by writer, per MD. Pt remains on 1:1 observation for safety. Pt safety maintained at this time.   Ted hoses have been ordered.  Echocardiogram performed. UA collected. Pt encouraged to elevate legs.

## 2014-07-01 NOTE — Progress Notes (Signed)
1:1 observation note: Pt observed sitting in his room, in his wheelchair, with sitter present. Pt remains grandiose and delusional. Pt offering to give Clinical research associate a raise and making monetary promises to sitter. Pt less anxious this morning. Pt thoughts are disorganized and speech tangential. Pt has bilateral edema to his lower extremities. Pt has a wheelchair d/t unsteady gait and edema. Pt remains on  1:1 observation for safety. Pt remains safe at this time.

## 2014-07-02 DIAGNOSIS — E785 Hyperlipidemia, unspecified: Secondary | ICD-10-CM

## 2014-07-02 LAB — GLUCOSE, CAPILLARY
Glucose-Capillary: 58 mg/dL — ABNORMAL LOW (ref 70–99)
Glucose-Capillary: 76 mg/dL (ref 70–99)
Glucose-Capillary: 125 mg/dL — ABNORMAL HIGH (ref 70–99)
Glucose-Capillary: 89 mg/dL (ref 70–99)
Glucose-Capillary: 198 mg/dL — ABNORMAL HIGH (ref 70–99)

## 2014-07-02 LAB — HEMOGLOBIN A1C
Hgb A1c MFr Bld: 8.1 % — ABNORMAL HIGH (ref <5.7)
Mean Plasma Glucose: 186 mg/dL — ABNORMAL HIGH (ref <117)

## 2014-07-02 MED ORDER — GLIPIZIDE ER 2.5 MG PO TB24: 2.5000 mg | ORAL_TABLET | Freq: Two times a day (BID) | ORAL | Status: DC

## 2014-07-02 MED ORDER — INSULIN ASPART 100 UNIT/ML ~~LOC~~ SOLN
0.0000 [IU] | Freq: Three times a day (TID) | SUBCUTANEOUS | Status: DC
  Administered 2014-07-02: 1 [IU] via SUBCUTANEOUS
  Administered 2014-07-03: 2 [IU] via SUBCUTANEOUS
  Administered 2014-07-04: 1 [IU] via SUBCUTANEOUS
  Administered 2014-07-05 (×2): 2 [IU] via SUBCUTANEOUS
  Administered 2014-07-06 – 2014-07-08 (×4): 1 [IU] via SUBCUTANEOUS
  Administered 2014-07-08: 2 [IU] via SUBCUTANEOUS
  Administered 2014-07-08 – 2014-07-10 (×4): 1 [IU] via SUBCUTANEOUS
  Administered 2014-07-10 – 2014-07-11 (×2): 2 [IU] via SUBCUTANEOUS
  Administered 2014-07-11: 13:00:00 via SUBCUTANEOUS
  Administered 2014-07-12: 2 [IU] via SUBCUTANEOUS
  Administered 2014-07-13: 1 [IU] via SUBCUTANEOUS
  Administered 2014-07-13: 2 [IU] via SUBCUTANEOUS
  Administered 2014-07-13: 3 [IU] via SUBCUTANEOUS
  Administered 2014-07-14: 5 [IU] via SUBCUTANEOUS
  Administered 2014-07-15 – 2014-07-16 (×3): 2 [IU] via SUBCUTANEOUS
  Administered 2014-07-16: 1 [IU] via SUBCUTANEOUS

## 2014-07-02 NOTE — Progress Notes (Signed)
[facility] PROGRESS NOTE  Christian [name] NFA:[id number] DOB: 03/29/1951 DOA: 06/21/2014 PCP: Leanor RubensteinSUN,VYVYAN Y, MD  Assessment/Plan: #1 Bilateral chronic lower extremity edema with chronic venous stasis changes   likely secondary to chronic venous stasis changes in the setting of NSAID use. Patient also with chronic kidney disease. Some improvement. At baseline. 2-D echo with EF of 55-60% with no wall motion abnormalities with mildly dilated left atrium, mildly dilated right medical, mildly dilated right atrium. Comprehensive metabolic profile with normal LFTs. EKG with Q waves in leads 2,3, aVF which is old. Pro BNP was 52.2. Urinalysis with negative protein. Continue current dose of diuretics which were increased to 80 mg twice daily. Check a basic metabolic profile in the morning to followup on electrolytes and renal function.  #2 diabetes mellitus type 2 Hemoglobin A1c is 8.1. CBGs ranged from 58-125. Discontinued glipizide. Will place on sensitive sliding scale insulin. Check CBGs every 4 hours until CBGs have stabilized. Diabetic coordinator following.  #3 hypertension Stable. Continue Lasix, hydralazine, Cozaar, metoprolol.  #4 hyperlipidemia Continue statin.  #5 chronic kidney disease Stable. And says abdomen discontinued.  #6 gout Stable. Continue allopurinol and colchicine.  #7 obstructive sleep apnea Continue CPAP.  #8 bipolar affective disorder with mania Continue Abilify. Monitor QTC. Per primary team.  Code Status: Full Family Communication: Updated patient at bedside. Disposition Plan: Per primary team.   Consultants:  [facility]: Dr. Malachi BondsShort 07/01/2014  Procedures:  2-D echo 07/01/2014    Antibiotics:  None  HPI/Subjective: Patient denies any shortness of breath. No chest pain. Patient feels lower extremity edema somewhat improved. No complaints.  Objective: Filed Vitals:   07/02/14 1657  BP: 141/84  Pulse: 82  Temp:   Resp:    No  intake or output data in the 24 hours ending 07/02/14 1743 Filed Weights   06/21/14 1552  Weight: 124.286 kg (274 lb)    Exam:   General:  NAD  Cardiovascular: RRR  Respiratory: CTAB  Abdomen: Soft, nontender, nondistended, positive bowel sounds.  Musculoskeletal: No clubbing or cyanosis. 1-2+ bilateral lower extremity edema  Data Reviewed: Basic Metabolic Panel:  Recent Labs Lab 07/01/14 2009  NA 136*  K 4.5  CL 96  CO2 30  GLUCOSE 72  BUN 30*  CREATININE 1.65*  CALCIUM 9.3   Liver Function Tests:  Recent Labs Lab 07/01/14 2009  AST 26  ALT 25  ALKPHOS 59  BILITOT 0.2*  PROT 7.1  ALBUMIN 3.3*   No results found for this basename: LIPASE, AMYLASE,  in the last 168 hours No results found for this basename: AMMONIA,  in the last 168 hours CBC: No results found for this basename: WBC, NEUTROABS, HGB, HCT, MCV, PLT,  in the last 168 hours Cardiac Enzymes: No results found for this basename: CKTOTAL, CKMB, CKMBINDEX, TROPONINI,  in the last 168 hours BNP (last 3 results)  Recent Labs  07/01/14 2009  PROBNP 52.2   CBG:  Recent Labs Lab 07/01/14 2154 07/02/14 0552 07/02/14 0617 07/02/14 1212 07/02/14 1701  GLUCAP 104* 58* 76 125* 89    No results found for this or any previous visit (from the past 240 hour(s)).   Studies: No results found.  Scheduled Meds: . allopurinol  300 mg Oral Daily  . ARIPiprazole  10 mg Oral BID PC  . aspirin EC  81 mg Oral Daily  . atorvastatin  10 mg Oral Daily  . colchicine  0.6 mg Oral BID  . divalproex  1,000 mg Oral BID  PC  . furosemide  80 mg Oral BID  . guaiFENesin  600 mg Oral BID  . hydrALAZINE  50 mg Oral 4 times per day  . insulin aspart  0-9 Units Subcutaneous TID WC  . LORazepam  0.5 mg Oral BID  . losartan  100 mg Oral Daily  . metoprolol  200 mg Oral Daily  . traZODone  200 mg Oral QHS   Continuous Infusions:   Principal Problem:   Bipolar affective disorder, current episode manic with  psychotic symptoms Active Problems:   Hypertension   Type II or unspecified type diabetes mellitus with unspecified complication, uncontrolled   Other and unspecified hyperlipidemia   Bilateral lower extremity edema: chronic with venous stasis changes   Gout   Bipolar affective, manic    Time spent: 35 minutes    Christian [name] M.D. [facility] Pager [phone]. If 7PM-7AM, please contact night-coverage at [email or web address], password [facility] 07/02/2014, 5:43 PM  LOS: 11 days

## 2014-07-02 NOTE — [facility] Group Notes (Signed)
Adult Psychoeducational Group Note  Date:  07/02/2014 Time:  9:19 PM  Group Topic/Focus:  Wrap-Up Group:   The focus of this group is to help patients review their daily goal of treatment and discuss progress on daily workbooks.  Participation Level:  Did Not Attend  Participation Quality:  None  Affect:  None  Cognitive:  None  Insight: None  Engagement in Group:  None  Modes of Intervention:  Discussion  Additional Comments:  Demarquis did not attend group.  Caroll Rancher A 07/02/2014, 9:19 PM

## 2014-07-02 NOTE — Progress Notes (Signed)
1:1 observation note: pt observed lying in bed with sitter  present at bedside. Pt do not appear to be in distress. No complaints verbalized by pt at this time.   Pt continues to exhibit inappropriate behaviors this morning. Pt argumentative towards staff and other pts. Pt grandiose and thoughts remains disorganized. Pt is demanding and speech is loud, pressured, and tangential. Pt intrusive and requires redirecting by staff.   Pt remains on 1:1 observation for unsteady gait and inappropriate behaviors. Pt safety maintained at this time.

## 2014-07-02 NOTE — Progress Notes (Signed)
Pt is in bed resting with eyes closed   He uses a cpap and his respirations are non labored    Pt on a 1:1 for safety due to and unsteady gait and swollen feet   Pt remains safe

## 2014-07-02 NOTE — [facility] Group Notes (Addendum)
[facility] LCSW Group Therapy  07/02/2014 1:16 PM   Type of Therapy:  Group Therapy  Participation Level:  Active  Participation Quality:  Attentive  Affect:  Appropriate  Cognitive:  Appropriate  Insight:  Improving  Engagement in Therapy:  Engaged  Modes of Intervention:  Clarification, Education, Exploration and Socialization  Summary of Progress/Problems: Today's group focused on relapse prevention.  We defined the term, and then brainstormed on ways to prevent relapse.  Lemarcus came in late.  He was disruptive in the process, and I admonished him for it.  He reacted angrily, but sat down and was quiet rather than leaving.  He interjected several times during group, after putting his hand up, but his comments were longwinded and only loosely associated with the topic.  They were always stories about himself-grandiosity.  Towards the end of the group he apologized, asked to shake my hand and began crying.  He recovered relatively quickly.  [name] 07/02/2014 , 1:16 PM

## 2014-07-02 NOTE — Progress Notes (Signed)
Inpatient Diabetes Program Recommendations  AACE/ADA: New Consensus Statement on Inpatient Glycemic Control (2013)  Target Ranges:  Prepandial:   less than 140 mg/dL      Peak postprandial:   less than 180 mg/dL (1-2 hours)      Critically ill patients:  140 - 180 mg/dL   Reason for Visit: Diabetes Consult  Diabetes history: DM2 Outpatient Diabetes medications: glipizide 10 mg bid Current orders for Inpatient glycemic control: Novolog sensitive tidwc  Results for FAUST, DOCKSTADER (MRN [id number]) as of 07/02/2014 15:46  Ref. Range 07/01/2014 20:51 07/01/2014 21:54 07/02/2014 05:52 07/02/2014 06:17 07/02/2014 12:12  Glucose-Capillary Latest Range: 70-99 mg/dL 58 (L) 245 (H) 58 (L) 76 125 (H)   Hypoglycemia throughout day and night. Poor glycemic control at home. Will need adjustment to diabetes meds prior to discharge.  Inpatient Diabetes Program Recommendations Correction (SSI): Would change Novolog to sensitive Q4H until CBGs stabilized Oral Agents: Agree with discontinuation of glipizide to prevent hypoglycemia HgbA1C: 8.1% - uncontrolled Encourage pt to make healthy choices in cafeteria.  Note: Will continue to follow. Thank you. Ailene Ards, RD, LDN, CDE  Inpatient Diabetes Coordinator [phone]

## 2014-07-02 NOTE — Progress Notes (Signed)
Pt continues to need a 1:1 for safety as he has an unsteady gait and some mild confusion   His blood sugar was low this morning as it was last night   protocal was followed  See notes for details   Pt remains safe

## 2014-07-02 NOTE — Progress Notes (Signed)
Observation note. Pt observed in the dayroom with sitter present. Pt continues to yell at staff and is difficult to redirect. Pt is demanding and argumentative with staff and other pts. Pt intrusive and continues to invade others privacy. Pt continues to disrupt the milieu with his inappropriate behaviors. Pt has difficulty processing information. Pt remains on 1:1 obs for safety. Pt safety maintained at this time.

## 2014-07-02 NOTE — Progress Notes (Signed)
Observation note: Pt observed sitting in his wheelchair, in his room, with sitter present. Pt do not appear to be in distress. Pt continues to be easily agitated and labile. Pt verbally aggressive and demanding. Pt difficult to redirect. Pt argumentative with staff and other pts on the unit. Pt remains on 1:1 for unsteady gait and unpredictable behaviors. Pt safety maintained at this time.

## 2014-07-02 NOTE — Progress Notes (Addendum)
Hypoglycemic Event  CBG: 58  Treatment: 15 GM carbohydrate snack  Symptoms: Shaky  Follow-up CBG: Time:0615 CBG Result:see results 76  Possible Reasons for Event: Unknown  Comments/MD notified PA Angelita Ingles J  Remember to initiate Hypoglycemia Order Set & complete

## 2014-07-02 NOTE — Progress Notes (Signed)
Patient ID: Christian [name], male   DOB: Mar 21, 1951, 63 y.o.   MRN: [id number] [facility] MD Progress Note  07/02/2014 5:50 PM Deaveon Schoen Peary  MRN:  [id number] Subjective:   Pt reports that he is "calm now but I used to be very manic". Pt denies SI, HI, and AVH, contracts for safety and reports that he is doing well. However, earlier in the shift when this NP attempte dto talk to patient, he reporte dthat he was going to leave her right now and that we had "better discharge him" so that his wife can pick him up so that he can go to court.    Diagnosis:   DSM5: Schizophrenia Disorders:  Delusional Disorder (297.1) Obsessive-Compulsive Disorders:   Trauma-Stressor Disorders:   Substance/Addictive Disorders:   Depressive Disorders:  Disruptive Mood Dysregulation Disorder (296.99) Total Time spent with patient: 20 minutes  Axis I: Bipolar affective disorder, current episode manic with psychotic symptoms  ADL's:  Impaired  Sleep: Fair  Appetite:  Fair  Suicidal Ideation:  Plan:  denies Intent:  denies Homicidal Ideation:  Plan:  denies  Intent:  denies AEB (as evidenced by):  Psychiatric Specialty Exam: Physical Exam  Vitals reviewed. Musculoskeletal: He exhibits edema.  Psychiatric: His affect is labile. His speech is rapid and/or pressured and tangential. He is aggressive and combative. Thought content is delusional. Cognition and memory are normal. He expresses impulsivity.    Review of Systems  Constitutional: Negative.   HENT: Negative.   Eyes: Negative.   Respiratory: Negative.   Cardiovascular: Positive for leg swelling (Improved with increase in lasix dosage ).  Gastrointestinal: Negative.   Genitourinary: Negative.   Musculoskeletal: Positive for joint pain.  Skin: Negative.   Neurological: Negative.        Fall risk  Endo/Heme/Allergies: Negative.   Psychiatric/Behavioral: Negative for suicidal ideas, hallucinations and substance abuse. The patient is nervous/anxious  and has insomnia.     Blood pressure 141/84, pulse 82, temperature 98.4 F (36.9 C), temperature source Oral, resp. rate 20, height 6' (1.829 m), weight 124.286 kg (274 lb), SpO2 99.00%.Body mass index is 37.15 kg/(m^2).  General Appearance: Moderately groomed, alert  Eye Contact::  Fair  Speech:  Loud  Volume:  Increased  Mood:  Angry, Irritable   Affect:  Labile  Thought Process:  Circumstantial and Disorganized  Orientation:  Full (Time, Place, and Person)  Thought Content:  Rumination  Suicidal Thoughts:  No  Homicidal Thoughts:  No  Memory:  Immediate;   Fair Recent;   Fair  Judgement:  Poor  Insight:  Shallow  Psychomotor Activity:  Decreased  Concentration:  Fair  Recall:  [place]: Fair  Akathisia:  Negative  Handed:  Right  AIMS (if indicated):     Assets:  Desire for Improvement Housing Social Support  Sleep:  Number of Hours: 5   Musculoskeletal: Strength & Muscle Tone: within normal limits Gait & Station: On wheelchair Patient leans: Backward  Current Medications: Current Facility-Administered Medications  Medication Dose Route Frequency Provider Last Rate Last Dose  . acetaminophen (TYLENOL) tablet 650 mg  650 mg Oral Q6H PRN Elmarie Shiley, NP   650 mg at 07/02/14 1730  . allopurinol (ZYLOPRIM) tablet 300 mg  300 mg Oral Daily Elmarie Shiley, NP   300 mg at 07/02/14 0743  . alum & mag hydroxide-simeth (MAALOX/MYLANTA) 200-200-20 MG/5ML suspension 30 mL  30 mL Oral Q4H PRN Elmarie Shiley, NP      . ARIPiprazole (  ABILIFY) tablet 10 mg  10 mg Oral BID PC [name]   10 mg at 07/02/14 1730  . aspirin EC tablet 81 mg  81 mg Oral Daily Janece Canterbury, MD   81 mg at 07/02/14 2505  . atorvastatin (LIPITOR) tablet 10 mg  10 mg Oral Daily Elmarie Shiley, NP   10 mg at 07/02/14 0744  . colchicine tablet 0.6 mg  0.6 mg Oral BID Elmarie Shiley, NP   0.6 mg at 07/02/14 1730  . divalproex (DEPAKOTE ER) 24 hr tablet 1,000 mg  1,000 mg Oral BID PC  Elmarie Shiley, NP   1,000 mg at 07/02/14 1729  . furosemide (LASIX) tablet 80 mg  80 mg Oral BID Janece Canterbury, MD   80 mg at 07/02/14 1730  . guaiFENesin (MUCINEX) 12 hr tablet 600 mg  600 mg Oral BID [name]   600 mg at 07/02/14 0748  . haloperidol lactate (HALDOL) injection 5 mg  5 mg Intramuscular Q6H PRN Merian Capron, MD   5 mg at 06/29/14 0754  . hydrALAZINE (APRESOLINE) tablet 50 mg  50 mg Oral 4 times per day Elmarie Shiley, NP   50 mg at 07/02/14 1729  . insulin aspart (novoLOG) injection 0-9 Units  0-9 Units Subcutaneous TID WC Eugenie Filler, MD   1 Units at 07/02/14 1225  . LORazepam (ATIVAN) tablet 0.5 mg  0.5 mg Oral BID Elmarie Shiley, NP   0.5 mg at 07/02/14 1729  . LORazepam (ATIVAN) tablet 2 mg  2 mg Oral Q6H PRN Laverle Hobby, PA-C   2 mg at 07/01/14 0221  . losartan (COZAAR) tablet 100 mg  100 mg Oral Daily Elmarie Shiley, NP   100 mg at 07/02/14 0743  . magnesium hydroxide (MILK OF MAGNESIA) suspension 30 mL  30 mL Oral Daily PRN Elmarie Shiley, NP      . metoprolol succinate (TOPROL-XL) 24 hr tablet 200 mg  200 mg Oral Daily Elmarie Shiley, NP   200 mg at 07/02/14 0743  . traZODone (DESYREL) tablet 200 mg  200 mg Oral QHS Elmarie Shiley, NP   200 mg at 07/01/14 2204    Lab Results:  Results for orders placed during the hospital encounter of 06/21/14 (from the past 48 hour(s))  GLUCOSE, CAPILLARY     Status: Abnormal   Collection Time    06/30/14  9:43 PM      Result Value Ref Range   Glucose-Capillary 124 (*) 70 - 99 mg/dL  GLUCOSE, CAPILLARY     Status: None   Collection Time    07/01/14  5:54 AM      Result Value Ref Range   Glucose-Capillary 83  70 - 99 mg/dL  VALPROIC ACID LEVEL     Status: None   Collection Time    07/01/14  6:19 AM      Result Value Ref Range   Valproic Acid Lvl 55.5  50.0 - 100.0 ug/mL   Comment: Performed at [place], CAPILLARY     Status: None   Collection Time    07/01/14 11:40 AM      Result Value Ref Range    Glucose-Capillary 91  70 - 99 mg/dL   Comment 1 Documented in Chart     Comment 2 Notify RN    GLUCOSE, CAPILLARY     Status: None   Collection Time    07/01/14  5:03 PM      Result Value Ref Range  Glucose-Capillary 92  70 - 99 mg/dL  URINALYSIS, ROUTINE W REFLEX MICROSCOPIC     Status: Abnormal   Collection Time    07/01/14  6:09 PM      Result Value Ref Range   Color, Urine YELLOW  YELLOW   APPearance CLEAR  CLEAR   Specific Gravity, Urine 1.008  1.005 - 1.030   pH 5.5  5.0 - 8.0   Glucose, UA NEGATIVE  NEGATIVE mg/dL   Hgb urine dipstick NEGATIVE  NEGATIVE   Bilirubin Urine NEGATIVE  NEGATIVE   Ketones, ur NEGATIVE  NEGATIVE mg/dL   Protein, ur NEGATIVE  NEGATIVE mg/dL   Urobilinogen, UA 0.2  0.0 - 1.0 mg/dL   Nitrite NEGATIVE  NEGATIVE   Leukocytes, UA SMALL (*) NEGATIVE   Comment: Performed at [place]     Status: Abnormal   Collection Time    07/01/14  6:09 PM      Result Value Ref Range   Squamous Epithelial / LPF FEW (*) RARE   WBC, UA 3-6  <3 WBC/hpf   Urine-Other FEW YEAST     Comment: Performed at [place] PANEL     Status: Abnormal   Collection Time    07/01/14  8:09 PM      Result Value Ref Range   Sodium 136 (*) 137 - 147 mEq/L   Potassium 4.5  3.7 - 5.3 mEq/L   Chloride 96  96 - 112 mEq/L   CO2 30  19 - 32 mEq/L   Glucose, Bld 72  70 - 99 mg/dL   BUN 30 (*) 6 - 23 mg/dL   Creatinine, Ser 1.65 (*) 0.50 - 1.35 mg/dL   Calcium 9.3  8.4 - 10.5 mg/dL   Total Protein 7.1  6.0 - 8.3 g/dL   Albumin 3.3 (*) 3.5 - 5.2 g/dL   AST 26  0 - 37 U/L   ALT 25  0 - 53 U/L   Alkaline Phosphatase 59  39 - 117 U/L   Total Bilirubin 0.2 (*) 0.3 - 1.2 mg/dL   GFR calc non Af Amer 44 (*) >90 mL/min   GFR calc Af Amer 51 (*) >90 mL/min   Comment: (NOTE)     The eGFR has been calculated using the CKD EPI equation.     This calculation has not been validated in all clinical  situations.     eGFR's persistently <90 mL/min signify possible Chronic Kidney     Disease.   Anion gap 10  5 - 15   Comment: Performed at [place]     Status: None   Collection Time    07/01/14  8:09 PM      Result Value Ref Range   Pro B Natriuretic peptide (BNP) 52.2  0 - 125 pg/mL   Comment: Performed at [facility]  TSH     Status: None   Collection Time    07/01/14  8:09 PM      Result Value Ref Range   TSH 0.819  0.350 - 4.500 uIU/mL   Comment: Performed at [place], CAPILLARY     Status: Abnormal   Collection Time    07/01/14  8:51 PM      Result Value Ref Range   Glucose-Capillary 58 (*) 70 - 99 mg/dL  GLUCOSE, CAPILLARY     Status: Abnormal  Collection Time    07/01/14  9:54 PM      Result Value Ref Range   Glucose-Capillary 104 (*) 70 - 99 mg/dL  GLUCOSE, CAPILLARY     Status: Abnormal   Collection Time    07/02/14  5:52 AM      Result Value Ref Range   Glucose-Capillary 58 (*) 70 - 99 mg/dL  GLUCOSE, CAPILLARY     Status: None   Collection Time    07/02/14  6:17 AM      Result Value Ref Range   Glucose-Capillary 76  70 - 99 mg/dL  HEMOGLOBIN O3J     Status: Abnormal   Collection Time    07/02/14  6:40 AM      Result Value Ref Range   Hemoglobin A1C 8.1 (*) <5.7 %   Comment: (NOTE)                                                                               According to the ADA Clinical Practice Recommendations for 2011, when     HbA1c is used as a screening test:      >=6.5%   Diagnostic of Diabetes Mellitus               (if abnormal result is confirmed)     5.7-6.4%   Increased risk of developing Diabetes Mellitus     References:Diagnosis and Classification of Diabetes Mellitus,Diabetes     Care,2011,34(Suppl 1):S62-S69 and Standards of Medical Care in             Diabetes - 2011,Diabetes Care,2011,34 (Suppl 1):S11-S61.   Mean Plasma Glucose 186 (*) <117 mg/dL    Comment: Performed at [facility]  GLUCOSE, CAPILLARY     Status: Abnormal   Collection Time    07/02/14 12:12 PM      Result Value Ref Range   Glucose-Capillary 125 (*) 70 - 99 mg/dL   Comment 1 Documented in Chart     Comment 2 Notify RN    GLUCOSE, CAPILLARY     Status: None   Collection Time    07/02/14  5:01 PM      Result Value Ref Range   Glucose-Capillary 89  70 - 99 mg/dL   Comment 1 Documented in Chart     Comment 2 Notify RN      Physical Findings: AIMS: Facial and Oral Movements Muscles of Facial Expression: None, normal Lips and Perioral Area: None, normal Jaw: None, normal Tongue: None, normal,Extremity Movements Upper (arms, wrists, hands, fingers): None, normal Lower (legs, knees, ankles, toes): None, normal, Trunk Movements Neck, shoulders, hips: None, normal, Overall Severity Severity of abnormal movements (highest score from questions above): None, normal Incapacitation due to abnormal movements: None, normal Patient's awareness of abnormal movements (rate only patient's report): No Awareness, Dental Status Current problems with teeth and/or dentures?: No Does patient usually wear dentures?: No  CIWA:    COWS:     Treatment Plan Summary: Daily contact with patient to assess and evaluate symptoms and progress in treatment Medication management Plan: 1. Continue crisis management and stabilization. 2. Medication management to reduce current symptoms to base line and improve the patient's overall  level of functioning: -Continue Depakote 1036m po BID for mood stabilization. -Continue  Abilify  128mpo bid for delusions/ mood. 3. Treat health problems as indicated. 4. Develop treatment plan to decrease risk of relapse upon discharge and the need for  readmission. 5. Psycho-social education regarding relapse prevention and self care. 6. Health care follow up as needed for medical problems. Continue Glipizide 10 mg BID, Novolog SSI for treatment of  Type 2 Diabetes. Start Novolog 3 units TID with meals for elevated blood sugars. Monitor CBG's four times daily. Continue Allopurinol 300 mg daily, Colchicine 0.6 mg BID for treatment of gout.  Patient using wheelchair at this time due to unsteady gait.  We will call internal medicine consult because he has bilateral feet swelling but is getting worse.  He is on Lasix however it is not helping.   [facility] 8. Renew 1:1 observation due to unsteady gait, intrusiveness, and easily agitated behaviors  9. His Depakote level is 55.5 .    Medical Decision Making Problem Points:  Established problem, worsening (2), Review of last therapy session (1) and Review of psycho-social stressors (1) Data Points:  Review or order clinical lab tests (1) Review or order medicine tests (1) Review of medication regiment & side effects (2)  I certify that inpatient services furnished can reasonably be expected to improve the patient's condition.   WiBenjamine MolaFNP-BC 07/02/2014, 5:50 PM

## 2014-07-02 NOTE — Progress Notes (Signed)
Pt has calmed down from the beginning of the shift where he was very agitated   He said he was upset because he didn't get to go home today and became tearful talking about it    Pt is somewhat confused and disorganized but redirects easily   Pt feet are swollen and he has an unsteady gait    Pt is at risk for a fall   Pt on 1:1 and is presently safe

## 2014-07-02 NOTE — Tx Team (Signed)
  Interdisciplinary Treatment Plan Update   Date Reviewed:  07/02/2014  Time Reviewed:  1:12 PM  Progress in Treatment:   Attending groups: Yes Sporadically Participating in groups: Yes  Limited Taking medication as prescribed: Yes  Tolerating medication: Yes Family/Significant other contact made: Yes  Patient understands diagnosis: Yes  Discussing patient identified problems/goals with staff: Yes Medical problems stabilized or resolved: Yes Denies suicidal/homicidal ideation: Yes Patient has not harmed self or others: Yes  For review of initial/current patient goals, please see plan of care.  Estimated Length of Stay:  4-5 days  Reason for Continuation of Hospitalization: Delusions  Mania Medication stabilization  New Problems/Goals identified:  N/A  Discharge Plan or Barriers:   on [facility] wait list  Wife states she will take out 50b in anticipation of pt's d/c.  She has contacted pt's sister and parents who tell her pt is not welcome there.  Additional Comments:  Pt continues intrusive with labile mood, grandiosity and delusions.  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  He is periodically loud and threatening, as well as breaking down in tears.  No recent psychotropic med changes.  Attendees:  Signature: Thedore Mins, MD 07/02/2014 1:12 PM   Signature: Richelle Ito, LCSW 07/02/2014 1:12 PM  Signature: [name], NP 07/02/2014 1:12 PM  Signature: Joslyn Devon, RN 07/02/2014 1:12 PM  Signature: Liborio Nixon, RN 07/02/2014 1:12 PM  Signature:  07/02/2014 1:12 PM  Signature:   07/02/2014 1:12 PM  Signature:    Signature:    Signature:    Signature:    Signature:    Signature:      Scribe for Treatment Team:   Richelle Ito, LCSW  07/02/2014 1:12 PM

## 2014-07-03 DIAGNOSIS — I5033 Acute on chronic diastolic (congestive) heart failure: Secondary | ICD-10-CM

## 2014-07-03 LAB — BASIC METABOLIC PANEL
Anion gap: 11 (ref 5–15)
BUN: 30 mg/dL — ABNORMAL HIGH (ref 6–23)
CO2: 31 mEq/L (ref 19–32)
Calcium: 9.5 mg/dL (ref 8.4–10.5)
Chloride: 94 mEq/L — ABNORMAL LOW (ref 96–112)
Creatinine, Ser: 1.81 mg/dL — ABNORMAL HIGH (ref 0.50–1.35)
GFR calc Af Amer: 45 mL/min — ABNORMAL LOW (ref >90)
GFR calc non Af Amer: 39 mL/min — ABNORMAL LOW (ref >90)
Glucose, Bld: 78 mg/dL (ref 70–99)
Potassium: 4.2 mEq/L (ref 3.7–5.3)
Sodium: 136 mEq/L — ABNORMAL LOW (ref 137–147)

## 2014-07-03 LAB — GLUCOSE, CAPILLARY
Glucose-Capillary: 88 mg/dL (ref 70–99)
Glucose-Capillary: 119 mg/dL — ABNORMAL HIGH (ref 70–99)
Glucose-Capillary: 155 mg/dL — ABNORMAL HIGH (ref 70–99)
Glucose-Capillary: 127 mg/dL — ABNORMAL HIGH (ref 70–99)

## 2014-07-03 MED ORDER — ARIPIPRAZOLE 5 MG PO TABS
5.0000 mg | ORAL_TABLET | Freq: Every day | ORAL | Status: DC
  Administered 2014-07-03: 5 mg via ORAL
  Filled 2014-07-03 (×3): qty 1

## 2014-07-03 MED ORDER — LORAZEPAM 1 MG PO TABS
1.0000 mg | ORAL_TABLET | Freq: Three times a day (TID) | ORAL | Status: DC
  Administered 2014-07-03 – 2014-07-16 (×39): 1 mg via ORAL
  Filled 2014-07-03 (×39): qty 1

## 2014-07-03 MED ORDER — ARIPIPRAZOLE 5 MG PO TABS
5.0000 mg | ORAL_TABLET | Freq: Every day | ORAL | Status: DC
  Administered 2014-07-04 – 2014-07-05 (×2): 5 mg via ORAL
  Filled 2014-07-03 (×3): qty 1

## 2014-07-03 NOTE — Progress Notes (Signed)
1:1 observation note: Pt observed sitting in his room with sitter present. Pt calm at this time. Pt apologized to Clinical research associate for his rude behavior during medication administration this morning. Pt remains delusional and grandiose. Pt continues to offer staff monetary gifts, stating that he is giving everyone a raise. Pt continues to be demanding, labile, and argumentative. Pt has bilateral edema to his lower extremities. Pt gait is unsteady and pt uses a wheelchair/walker as needed. Pt remains on 1:1 observation for safety. Pt safety maintained at this time.

## 2014-07-03 NOTE — Progress Notes (Signed)
Pt is calm and cooperative this morning   He reports sleeping good and is ready for his compression stockings to be put on   Pt feet remain swollen and his gait is unsteady   He has been using the wheel chair as a walker and has ambulated short distances with standby assist   Pt is on 1:1 and is presently safe

## 2014-07-03 NOTE — Progress Notes (Signed)
Observation note: Pt observed sitting in the dayroom with sitter present. Pt mood is unpredictable. Pt having episodes where he is crying and then episodes where he is labile. Pt remains delusional and grandiose this evening. Pt continues to tell staff and other pts on the milieu, that he is a Community education officer and offer them monetary gifts. Pt continues to be verbally aggressive, threatening staff, argumentative and demanding. Pt remains on 1:1 observation for safety. Pt safety maintained at this time.

## 2014-07-03 NOTE — Progress Notes (Signed)
Did not attended group 

## 2014-07-03 NOTE — Progress Notes (Signed)
Patient ID: Christian [name], male   DOB: 05/05/1951, 63 y.o.   MRN: [id number] Patient ID: Christian [name], male   DOB: Apr 21, 1951, 63 y.o.   MRN: [id number] [facility] MD Progress Note  07/03/2014 12:41 PM Christian [name]  MRN:  [id number] Subjective:   Objective: patient continues to be quite disruptive and inappropriately irritable to staff, other patients. Speech is loud and patient is unable to participate in groups and is often disruptive.  Diagnosis:   DSM5: Schizophrenia Disorders:  Delusional Disorder (297.1) Obsessive-Compulsive Disorders:   Trauma-Stressor Disorders:   Substance/Addictive Disorders:   Depressive Disorders:  Disruptive Mood Dysregulation Disorder (296.99) Total Time spent with patient: 20 minutes  Axis I: Bipolar affective disorder, current episode manic with psychotic symptoms  ADL's:  Impaired  Sleep: Fair  Appetite:  Fair  Suicidal Ideation:  Plan:  denies Intent:  denies Homicidal Ideation:  Plan:  denies  Intent:  denies AEB (as evidenced by):  Psychiatric Specialty Exam: Physical Exam  Vitals reviewed. Musculoskeletal: He exhibits edema.  Psychiatric: His affect is labile. His speech is rapid and/or pressured and tangential. He is aggressive and combative. Thought content is delusional. Cognition and memory are normal. He expresses impulsivity.    Review of Systems  Constitutional: Negative.   HENT: Negative.   Eyes: Negative.   Respiratory: Negative.   Cardiovascular: Positive for leg swelling (Improved with increase in lasix dosage ).  Gastrointestinal: Negative.   Genitourinary: Negative.   Musculoskeletal: Positive for joint pain.  Skin: Negative.   Neurological: Negative.        Fall risk  Endo/Heme/Allergies: Negative.   Psychiatric/Behavioral: Negative for suicidal ideas, hallucinations and substance abuse. The patient is nervous/anxious and has insomnia.     Blood pressure 113/72, pulse 72, temperature 98.6 F (37 C), temperature  source Oral, resp. rate 18, height 6' (1.829 m), weight 124.286 kg (274 lb), SpO2 99.00%.Body mass index is 37.15 kg/(m^2).  General Appearance: Moderately groomed, alert  Eye Contact::  Fair  Speech:  Loud  Volume:  Increased  Mood:  Angry, Irritable   Affect:  Labile  Thought Process:  Circumstantial and Disorganized  Orientation:  Full (Time, Place, and Person)  Thought Content:  Rumination  Suicidal Thoughts:  No  Homicidal Thoughts:  No  Memory:  Immediate;   Fair Recent;   Fair  Judgement:  Poor  Insight:  Shallow  Psychomotor Activity:  Decreased  Concentration:  Fair  Recall:  [place]: Fair  Akathisia:  Negative  Handed:  Right  AIMS (if indicated):     Assets:  Desire for Improvement Housing Social Support  Sleep:  Number of Hours: 5.25   Musculoskeletal: Strength & Muscle Tone: within normal limits Gait & Station: On wheelchair Patient leans: Backward  Current Medications: Current Facility-Administered Medications  Medication Dose Route Frequency Provider Last Rate Last Dose  . acetaminophen (TYLENOL) tablet 650 mg  650 mg Oral Q6H PRN Elmarie Shiley, NP   650 mg at 07/03/14 2707  . allopurinol (ZYLOPRIM) tablet 300 mg  300 mg Oral Daily Elmarie Shiley, NP   300 mg at 07/03/14 0753  . alum & mag hydroxide-simeth (MAALOX/MYLANTA) 200-200-20 MG/5ML suspension 30 mL  30 mL Oral Q4H PRN Elmarie Shiley, NP      . ARIPiprazole (ABILIFY) tablet 10 mg  10 mg Oral BID PC [name]   10 mg at 07/03/14 0753  . ARIPiprazole (ABILIFY) tablet 5 mg  5 mg Oral Daily Nena Polio,  PA-C      . aspirin EC tablet 81 mg  81 mg Oral Daily Janece Canterbury, MD   81 mg at 07/03/14 0753  . atorvastatin (LIPITOR) tablet 10 mg  10 mg Oral Daily Elmarie Shiley, NP   10 mg at 07/03/14 0754  . colchicine tablet 0.6 mg  0.6 mg Oral BID Elmarie Shiley, NP   0.6 mg at 07/03/14 0753  . divalproex (DEPAKOTE ER) 24 hr tablet 1,000 mg  1,000 mg Oral BID PC Elmarie Shiley, NP    1,000 mg at 07/03/14 0754  . furosemide (LASIX) tablet 80 mg  80 mg Oral BID Janece Canterbury, MD   80 mg at 07/03/14 0754  . guaiFENesin (MUCINEX) 12 hr tablet 600 mg  600 mg Oral BID [name]   600 mg at 07/03/14 0753  . hydrALAZINE (APRESOLINE) tablet 50 mg  50 mg Oral 4 times per day Elmarie Shiley, NP   50 mg at 07/03/14 1158  . insulin aspart (novoLOG) injection 0-9 Units  0-9 Units Subcutaneous TID WC Eugenie Filler, MD   1 Units at 07/02/14 1225  . LORazepam (ATIVAN) tablet 1 mg  1 mg Oral TID PC Neil Mashburn, PA-C      . magnesium hydroxide (MILK OF MAGNESIA) suspension 30 mL  30 mL Oral Daily PRN Elmarie Shiley, NP      . metoprolol succinate (TOPROL-XL) 24 hr tablet 200 mg  200 mg Oral Daily Elmarie Shiley, NP   200 mg at 07/03/14 0753  . traZODone (DESYREL) tablet 200 mg  200 mg Oral QHS Elmarie Shiley, NP   200 mg at 07/02/14 2135    Lab Results:  Results for orders placed during the hospital encounter of 06/21/14 (from the past 48 hour(s))  GLUCOSE, CAPILLARY     Status: None   Collection Time    07/01/14  5:03 PM      Result Value Ref Range   Glucose-Capillary 92  70 - 99 mg/dL  URINALYSIS, ROUTINE W REFLEX MICROSCOPIC     Status: Abnormal   Collection Time    07/01/14  6:09 PM      Result Value Ref Range   Color, Urine YELLOW  YELLOW   APPearance CLEAR  CLEAR   Specific Gravity, Urine 1.008  1.005 - 1.030   pH 5.5  5.0 - 8.0   Glucose, UA NEGATIVE  NEGATIVE mg/dL   Hgb urine dipstick NEGATIVE  NEGATIVE   Bilirubin Urine NEGATIVE  NEGATIVE   Ketones, ur NEGATIVE  NEGATIVE mg/dL   Protein, ur NEGATIVE  NEGATIVE mg/dL   Urobilinogen, UA 0.2  0.0 - 1.0 mg/dL   Nitrite NEGATIVE  NEGATIVE   Leukocytes, UA SMALL (*) NEGATIVE   Comment: Performed at [place]     Status: Abnormal   Collection Time    07/01/14  6:09 PM      Result Value Ref Range   Squamous Epithelial / LPF FEW (*) RARE   WBC, UA 3-6  <3 WBC/hpf    Urine-Other FEW YEAST     Comment: Performed at [place] PANEL     Status: Abnormal   Collection Time    07/01/14  8:09 PM      Result Value Ref Range   Sodium 136 (*) 137 - 147 mEq/L   Potassium 4.5  3.7 - 5.3 mEq/L   Chloride 96  96 - 112 mEq/L   CO2 30  19 - 32 mEq/L   Glucose, Bld 72  70 - 99 mg/dL   BUN 30 (*) 6 - 23 mg/dL   Creatinine, Ser 1.65 (*) 0.50 - 1.35 mg/dL   Calcium 9.3  8.4 - 10.5 mg/dL   Total Protein 7.1  6.0 - 8.3 g/dL   Albumin 3.3 (*) 3.5 - 5.2 g/dL   AST 26  0 - 37 U/L   ALT 25  0 - 53 U/L   Alkaline Phosphatase 59  39 - 117 U/L   Total Bilirubin 0.2 (*) 0.3 - 1.2 mg/dL   GFR calc non Af Amer 44 (*) >90 mL/min   GFR calc Af Amer 51 (*) >90 mL/min   Comment: (NOTE)     The eGFR has been calculated using the CKD EPI equation.     This calculation has not been validated in all clinical situations.     eGFR's persistently <90 mL/min signify possible Chronic Kidney     Disease.   Anion gap 10  5 - 15   Comment: Performed at [place]     Status: None   Collection Time    07/01/14  8:09 PM      Result Value Ref Range   Pro B Natriuretic peptide (BNP) 52.2  0 - 125 pg/mL   Comment: Performed at [facility]  TSH     Status: None   Collection Time    07/01/14  8:09 PM      Result Value Ref Range   TSH 0.819  0.350 - 4.500 uIU/mL   Comment: Performed at [place], CAPILLARY     Status: Abnormal   Collection Time    07/01/14  8:51 PM      Result Value Ref Range   Glucose-Capillary 58 (*) 70 - 99 mg/dL  GLUCOSE, CAPILLARY     Status: Abnormal   Collection Time    07/01/14  9:54 PM      Result Value Ref Range   Glucose-Capillary 104 (*) 70 - 99 mg/dL  GLUCOSE, CAPILLARY     Status: Abnormal   Collection Time    07/02/14  5:52 AM      Result Value Ref Range   Glucose-Capillary 58 (*) 70 - 99 mg/dL  GLUCOSE, CAPILLARY      Status: None   Collection Time    07/02/14  6:17 AM      Result Value Ref Range   Glucose-Capillary 76  70 - 99 mg/dL  HEMOGLOBIN A1C     Status: Abnormal   Collection Time    07/02/14  6:40 AM      Result Value Ref Range   Hemoglobin A1C 8.1 (*) <5.7 %   Comment: (NOTE)                                                                               According to the ADA Clinical Practice Recommendations for 2011, when     HbA1c is used as a screening test:      >=6.5%   Diagnostic of Diabetes Mellitus               (  if abnormal result is confirmed)     5.7-6.4%   Increased risk of developing Diabetes Mellitus     References:Diagnosis and Classification of Diabetes Mellitus,Diabetes     YYTK,3546,56(CLEXN 1):S62-S69 and Standards of Medical Care in             Diabetes - 2011,Diabetes TZGY,1749,44 (Suppl 1):S11-S61.   Mean Plasma Glucose 186 (*) <117 mg/dL   Comment: Performed at [place], CAPILLARY     Status: Abnormal   Collection Time    07/02/14 12:12 PM      Result Value Ref Range   Glucose-Capillary 125 (*) 70 - 99 mg/dL   Comment 1 Documented in Chart     Comment 2 Notify RN    GLUCOSE, CAPILLARY     Status: None   Collection Time    07/02/14  5:01 PM      Result Value Ref Range   Glucose-Capillary 89  70 - 99 mg/dL   Comment 1 Documented in Chart     Comment 2 Notify RN    GLUCOSE, CAPILLARY     Status: Abnormal   Collection Time    07/02/14  8:37 PM      Result Value Ref Range   Glucose-Capillary 198 (*) 70 - 99 mg/dL  GLUCOSE, CAPILLARY     Status: None   Collection Time    07/03/14  6:10 AM      Result Value Ref Range   Glucose-Capillary 88  70 - 99 mg/dL   Comment 1 Notify RN    BASIC METABOLIC PANEL     Status: Abnormal   Collection Time    07/03/14  6:25 AM      Result Value Ref Range   Sodium 136 (*) 137 - 147 mEq/L   Potassium 4.2  3.7 - 5.3 mEq/L   Chloride 94 (*) 96 - 112 mEq/L   CO2 31  19 - 32 mEq/L   Glucose, Bld 78  70 -  99 mg/dL   BUN 30 (*) 6 - 23 mg/dL   Creatinine, Ser 1.81 (*) 0.50 - 1.35 mg/dL   Calcium 9.5  8.4 - 10.5 mg/dL   GFR calc non Af Amer 39 (*) >90 mL/min   GFR calc Af Amer 45 (*) >90 mL/min   Comment: (NOTE)     The eGFR has been calculated using the CKD EPI equation.     This calculation has not been validated in all clinical situations.     eGFR's persistently <90 mL/min signify possible Chronic Kidney     Disease.   Anion gap 11  5 - 15   Comment: Performed at [place], CAPILLARY     Status: Abnormal   Collection Time    07/03/14 11:27 AM      Result Value Ref Range   Glucose-Capillary 119 (*) 70 - 99 mg/dL   Comment 1 Documented in Chart     Comment 2 Notify RN      Physical Findings: AIMS: Facial and Oral Movements Muscles of Facial Expression: None, normal Lips and Perioral Area: None, normal Jaw: None, normal Tongue: None, normal,Extremity Movements Upper (arms, wrists, hands, fingers): None, normal Lower (legs, knees, ankles, toes): None, normal, Trunk Movements Neck, shoulders, hips: None, normal, Overall Severity Severity of abnormal movements (highest score from questions above): None, normal Incapacitation due to abnormal movements: None, normal Patient's awareness of abnormal movements (rate only patient's report): No Awareness, Dental Status Current problems  with teeth and/or dentures?: No Does patient usually wear dentures?: No  CIWA:    COWS:     Treatment Plan Summary: Daily contact with patient to assess and evaluate symptoms and progress in treatment Medication management Plan: 1. Continue crisis management and stabilization. 2. After discussion with Dr. Sabra Heck will increase abilify to 79m a day as 10 in AM, 534mat lunch, and 10at hs. 3. Will also increase depakote as noted as his level is 55. 4. Will also add 23m59mtivan TID scheduled for agitation.  Medical Decision Making Problem Points:  Established problem,  worsening (2), Review of last therapy session (1) and Review of psycho-social stressors (1) Data Points:  Review or order clinical lab tests (1) Review or order medicine tests (1) Review of medication regiment & side effects (2)  I certify that inpatient services furnished can reasonably be expected to improve the patient's condition.   MASHBURN,NEIL,07/03/2014, 12:41 PM

## 2014-07-03 NOTE — Progress Notes (Signed)
Patient ID: Christian [name], male   DOB: 1951-02-18, 63 y.o.   MRN: [id number] 1:1 notes  07/03/2014 @ 2200 D: Patient in dayroom watching TV and interacting with peers. Pt still grandiose, irritable, and argumentative. Pt c/o bilateral shoulder pain and plans to sue the arresting officer who handcuffed him. Pt reports he will be a millionaire because his case will be very easy to prosecute. Pt offered writer [phone] for helping take care of him.  A: Medication administered as prescribed.1:1 observation for safety R: Patient is awake. Pt reports he will behave and be less argumentative.  1:1 continues

## 2014-07-03 NOTE — Progress Notes (Addendum)
Patient ID: Christian [name], male   DOB: [birth year], 63 [name].o.   MRN: [id number] [facility] PROGRESS NOTE  Christian [name] [id number] DOB: [birth year] DOA: 06/21/2014 PCP: Christian RubensteinSUN,VYVYAN Y, MD  Brief narrative: [facility] consulted for management of chronic medica lconditions including DM, HTN, HLD, diastolic CHF, LE edema.  Principal Problem:   Bipolar affective disorder, current episode manic with psychotic symptoms - still delusional, management per primary team  Active Problems:   Hypertension - currently on Lasix, Losartan, Hydralazine, Metoprolol  - SBP in 110 - 120's - hold Losartan until renal function stabilizes - continue other antihypertensive regimen noted above   Acute on chronic diastolic CHF - 2 D ECHO August 3rd, normal EF 60% - improving, no crackles on exam but still with LE swelling - continue Lasix - weight is 274 lbs this AM - continue to monitor daily weights    Type II or unspecified type diabetes mellitus with unspecified complication, uncontrolled - reasonable inpatient control - continue SSI while inpatient, upon discharge may resume glipizide    Other and unspecified hyperlipidemia - stable, continue statin    Bilateral lower extremity edema: chronic with venous stasis changes - improving overall, continue lasix but if renal function continues to deteriorate will have to readjust the dosing - monitor weight as well   Acute on chronic renal failure - Cr trending up: 1.6 --> 1.8 - stop losartan until renal function better - may need to lower the dose of lasix as well   Gout - stable, continue colchicine    Bipolar affective, manic - management per primary team   Consultants:  [facility] is consulting   Procedures/Studies:  None  Antibiotics:  None   Code Status: Full Family Communication: Pt at bedside [name]  HPI/Subjective: No events overnight.   Objective: Filed Vitals:   07/02/14 1205 07/02/14 1657 07/03/14 0617  07/03/14 0618  BP: 140/70 141/84 109/79 119/75  Pulse: 79 82 77 79  Temp:   98.6 F (37 C)   TempSrc:      Resp:   18   Height:      Weight:      SpO2:       No intake or output data in the 24 hours ending 07/03/14 1114  Exam:   General:  Pt is alert, follows commands appropriately, not in acute distress  Cardiovascular: Regular rate and rhythm, S1/S2, no murmurs, no rubs, no gallops  Respiratory: Clear to auscultation bilaterally, no wheezing, no crackles, no rhonchi  Abdomen: Soft, non tender, non distended, bowel sounds present, no guarding  Extremities: Bilateral LE edema, pulses DP and PT palpable bilaterally  Data Reviewed: Basic Metabolic Panel:  Recent Labs Lab 07/01/14 2009 07/03/14 0625  NA 136* 136*  K 4.5 4.2  CL 96 94*  CO2 30 31  GLUCOSE 72 78  BUN 30* 30*  CREATININE 1.65* 1.81*  CALCIUM 9.3 9.5   Liver Function Tests:  Recent Labs Lab 07/01/14 2009  AST 26  ALT 25  ALKPHOS 59  BILITOT 0.2*  PROT 7.1  ALBUMIN 3.3*   CBG:  Recent Labs Lab 07/02/14 0617 07/02/14 1212 07/02/14 1701 07/02/14 2037 07/03/14 0610  GLUCAP 76 125* 89 198* 88   Scheduled Meds: . allopurinol  300 mg Oral Daily  . ARIPiprazole  10 mg Oral BID PC  . aspirin EC  81 mg Oral Daily  . atorvastatin  10 mg Oral Daily  . colchicine  0.6 mg Oral BID  .  divalproex  1,000 mg Oral BID PC  . furosemide  80 mg Oral BID  . hydrALAZINE  50 mg Oral 4 times per day  . insulin aspart  0-9 Units Subcutaneous TID WC  . LORazepam  0.5 mg Oral BID  . losartan  100 mg Oral Daily  . metoprolol  200 mg Oral Daily  . traZODone  200 mg Oral QHS   Continuous Infusions:  Christian Presto, MD  [facility] Pager [phone]  If 7PM-7AM, please contact night-coverage [email or web address] Password [email or web address] 07/03/2014, 11:14 AM   LOS: 12 days

## 2014-07-03 NOTE — Progress Notes (Signed)
Observation note: pt observed sitting in the dayroom with sitter present. Pt appears calm and cooperative at this time. Pt remains on 1:1 obs until d/c'd.  Pt safety maintained.

## 2014-07-03 NOTE — [facility] Group Notes (Signed)
[facility] [employer] Group Therapy  07/03/2014 , 1:33 PM    Type of Therapy:  [employer] Presentation  Participation Level:  Active  Participation Quality:  Attentive  Affect:  Blunted  Cognitive:  Oriented  Insight:  Limited  Engagement in Therapy:  Engaged  Modes of Intervention:  Discussion, Education and Socialization  Summary of Progress/Problems:  Onalee Hua from [employer] came to present his recovery story and play the guitar.  Came in quietly, but kept engaging 1:1 in conversation.  Then insistently engaged the presenter, telling him he was doing well as he only made a few mistakes, making requests for songs and telling him he would pay for him to record a CD.  [name] 07/03/2014 , 1:33 PM

## 2014-07-03 NOTE — Progress Notes (Signed)
Pt has been resting in bed with eyes closed not distress noted  He is using his cpap machine   Pt is on a 1:1 for being a high fall risk due to unsteady gait and swollen feet   He remains safe presently

## 2014-07-03 NOTE — [facility] Group Notes (Signed)
[facility] LCSW Aftercare Discharge Planning Group Note   07/03/2014 11:25 AM  Participation Quality:  Disruptive  Mood/Affect:  Irritable  Depression Rating:  0  Anxiety Rating:  0  Thoughts of Suicide:  No Will you contract for safety?   NA  Current AVH:  No  Plan for Discharge/Comments:  Per usual, pt was interrupting others, telling them what they could do or not do, and then ceasing right before there were angry words or threats made.  As for himself, he is still angry about no court yesterday, about not being able to walk, about not being given his cane, and about his wife not bringing him things he asked for.  He is threatening "trouble" if not released today [per usual].  I told him I wouod get the paperwork today telling him when his court date is continued to.  Transportation Means: unk  Supports: no one   [place], [place] B

## 2014-07-04 LAB — BASIC METABOLIC PANEL
Anion gap: 9 (ref 5–15)
BUN: 32 mg/dL — ABNORMAL HIGH (ref 6–23)
CO2: 32 mEq/L (ref 19–32)
Calcium: 9.5 mg/dL (ref 8.4–10.5)
Chloride: 94 mEq/L — ABNORMAL LOW (ref 96–112)
Creatinine, Ser: 1.64 mg/dL — ABNORMAL HIGH (ref 0.50–1.35)
GFR calc Af Amer: 51 mL/min — ABNORMAL LOW (ref >90)
GFR calc non Af Amer: 44 mL/min — ABNORMAL LOW (ref >90)
Glucose, Bld: 97 mg/dL (ref 70–99)
Potassium: 4.2 mEq/L (ref 3.7–5.3)
Sodium: 135 mEq/L — ABNORMAL LOW (ref 137–147)

## 2014-07-04 LAB — GLUCOSE, CAPILLARY
Glucose-Capillary: 97 mg/dL (ref 70–99)
Glucose-Capillary: 111 mg/dL — ABNORMAL HIGH (ref 70–99)
Glucose-Capillary: 138 mg/dL — ABNORMAL HIGH (ref 70–99)
Glucose-Capillary: 157 mg/dL — ABNORMAL HIGH (ref 70–99)

## 2014-07-04 NOTE — Progress Notes (Signed)
D: Patient has been pleasant to staff but argumentative with other peers at times. Still needing some assistance and redirection at times due to fall risk. A: Continues on 1:1 for fall risk and intrusive behaviors. R: Cooperative at this time.

## 2014-07-04 NOTE — Progress Notes (Signed)
1:1 observation  Pt. Remains on 1:1 observation, he is ambulatory with some assistance.  Pt. Is awake and alert, no acute distress noted.

## 2014-07-04 NOTE — Progress Notes (Signed)
1:1 notes  07/04/2014 @ 0200 D: Patient in bed sleeping. Respiration regular and unlabored. No sign of distress noted at this time A: 1:1 observation for safety R: Patient is asleep. Sitter at bedside. 1:1 continues

## 2014-07-04 NOTE — Clinical Social Work Note (Signed)
Spoke with wife who is planning on telling pt tomorrow [Friday] that she will not allow him to return.  She then plans to have him served with 50b.  He has no access to money until the month of September.

## 2014-07-04 NOTE — Progress Notes (Signed)
[facility] MD Progress Note  07/04/2014 8:33 AM Christian [name]  MRN:  [id number] Subjective:  Patient rambles during assessment, difficult to redirect.  Hypomanic symptoms remain.  He has a list for discharge and threatens with an attorney if he is not released.  When he was told he would not be discharged today, he got upset with increase voice tone but was able to calm himself down.  Then, he became tearful--staff very supportive and encouraging to the patient.  He does admit to saying things he shouldn't and getting upset.  Christian [name] states he is working on this. Diagnosis:   DSM5:  Total Time spent with patient: 20 minutes  Axis I: Bipolar, Manic Axis II: Deferred Axis III:  Past Medical History  Diagnosis Date  . Gout   . Hypertension   . Mood swings   . Type II or unspecified type diabetes mellitus with unspecified complication, uncontrolled 06/02/2014  . Other and unspecified hyperlipidemia 06/02/2014  . OSA on CPAP 06/02/2014  . 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 Bipolar disorder   . CHF (congestive heart failure)   . CKD (chronic kidney disease), stage III    Axis IV: other psychosocial or environmental problems, problems related to social environment and problems with primary support group Axis V: 21-30 behavior considerably influenced by delusions or hallucinations OR serious impairment in judgment, communication OR inability to function in almost all areas  ADL's:  Intact  Sleep: Fair  Appetite:  Good  Suicidal Ideation:  Denies Homicidal Ideation:  Denies  Psychiatric Specialty Exam: Physical Exam  Constitutional: He is oriented to person, place, and time. He appears well-developed and well-nourished.  HENT:  Head: Normocephalic and atraumatic.  Neck: Normal range of motion.  Respiratory: Effort normal.  Musculoskeletal: Normal range of motion.  Neurological: He is alert and oriented to person, place, and time.  Skin: Skin  is warm and dry.    Review of Systems  Constitutional: Negative.   HENT: Negative.   Eyes: Negative.   Respiratory: Negative.   Cardiovascular: Negative.   Gastrointestinal: Negative.   Genitourinary: Negative.   Musculoskeletal: Negative.   Skin: Negative.   Neurological: Negative.   Endo/Heme/Allergies: Negative.   Psychiatric/Behavioral:       Labile, hypomanic    Blood pressure 112/75, pulse 85, temperature 98.7 F (37.1 C), temperature source Oral, resp. rate 18, height 6' (1.829 m), weight 123.378 kg (272 lb), SpO2 99.00%.Body mass index is 36.88 kg/(m^2).  General Appearance: Casual  Eye Contact::  Fair  Speech:  slightly pressured  Volume:  Increased  Mood:  Anxious and Irritable  Affect:  Blunt  Thought Process:  Irrelevant and Tangential  Orientation:  Full (Time, Place, and Person)  Thought Content:  Obsessions  Suicidal Thoughts:  No  Homicidal Thoughts:  No  Memory:  Immediate;   Fair Recent;   Fair Remote;   Fair  Judgement:  Poor  Insight:  Lacking  Psychomotor Activity:  Increased  Concentration:  Fair  Recall:  [employer] of Knowledge:Good  Language: Good  Akathisia:  No  Handed:  Right  AIMS (if indicated):     Assets:  Housing Leisure Time Resilience Social Support  Sleep:  Number of Hours: 3   Musculoskeletal: Strength & Muscle Tone: within normal limits Gait & Station: unsteady Patient leans: N/A  Current Medications: Current Facility-Administered Medications  Medication Dose Route Frequency Provider Last Rate Last Dose  . acetaminophen (TYLENOL) tablet 650 mg  650 mg Oral Q6H PRN Elmarie Shiley, NP   650 mg at 07/04/14 0448  . allopurinol (ZYLOPRIM) tablet 300 mg  300 mg Oral Daily Elmarie Shiley, NP   300 mg at 07/04/14 0802  . alum & mag hydroxide-simeth (MAALOX/MYLANTA) 200-200-20 MG/5ML suspension 30 mL  30 mL Oral Q4H PRN Elmarie Shiley, NP      . ARIPiprazole (ABILIFY) tablet 10 mg  10 mg Oral BID PC [name]   10 mg at 07/04/14  0802  . ARIPiprazole (ABILIFY) tablet 5 mg  5 mg Oral Daily Nena Polio, PA-C      . aspirin EC tablet 81 mg  81 mg Oral Daily Janece Canterbury, MD   81 mg at 07/04/14 0802  . atorvastatin (LIPITOR) tablet 10 mg  10 mg Oral Daily Elmarie Shiley, NP   10 mg at 07/04/14 0802  . colchicine tablet 0.6 mg  0.6 mg Oral BID Elmarie Shiley, NP   0.6 mg at 07/04/14 1025  . divalproex (DEPAKOTE ER) 24 hr tablet 1,000 mg  1,000 mg Oral BID PC Elmarie Shiley, NP   1,000 mg at 07/04/14 0802  . furosemide (LASIX) tablet 80 mg  80 mg Oral BID Janece Canterbury, MD   80 mg at 07/04/14 0802  . guaiFENesin (MUCINEX) 12 hr tablet 600 mg  600 mg Oral BID [name]   600 mg at 07/04/14 0803  . hydrALAZINE (APRESOLINE) tablet 50 mg  50 mg Oral 4 times per day Elmarie Shiley, NP   50 mg at 07/04/14 0709  . insulin aspart (novoLOG) injection 0-9 Units  0-9 Units Subcutaneous TID WC Eugenie Filler, MD   2 Units at 07/03/14 1712  . LORazepam (ATIVAN) tablet 1 mg  1 mg Oral TID PC Nena Polio, PA-C   1 mg at 07/04/14 0802  . magnesium hydroxide (MILK OF MAGNESIA) suspension 30 mL  30 mL Oral Daily PRN Elmarie Shiley, NP      . metoprolol succinate (TOPROL-XL) 24 hr tablet 200 mg  200 mg Oral Daily Elmarie Shiley, NP   200 mg at 07/04/14 0802  . traZODone (DESYREL) tablet 200 mg  200 mg Oral QHS Elmarie Shiley, NP   200 mg at 07/03/14 2204    Lab Results:  Results for orders placed during the hospital encounter of 06/21/14 (from the past 48 hour(s))  GLUCOSE, CAPILLARY     Status: Abnormal   Collection Time    07/02/14 12:12 PM      Result Value Ref Range   Glucose-Capillary 125 (*) 70 - 99 mg/dL   Comment 1 Documented in Chart     Comment 2 Notify RN    GLUCOSE, CAPILLARY     Status: None   Collection Time    07/02/14  5:01 PM      Result Value Ref Range   Glucose-Capillary 89  70 - 99 mg/dL   Comment 1 Documented in Chart     Comment 2 Notify RN    GLUCOSE, CAPILLARY     Status: Abnormal   Collection Time    07/02/14   8:37 PM      Result Value Ref Range   Glucose-Capillary 198 (*) 70 - 99 mg/dL  GLUCOSE, CAPILLARY     Status: None   Collection Time    07/03/14  6:10 AM      Result Value Ref Range   Glucose-Capillary 88  70 - 99 mg/dL   Comment 1 Notify RN    BASIC  METABOLIC PANEL     Status: Abnormal   Collection Time    07/03/14  6:25 AM      Result Value Ref Range   Sodium 136 (*) 137 - 147 mEq/L   Potassium 4.2  3.7 - 5.3 mEq/L   Chloride 94 (*) 96 - 112 mEq/L   CO2 31  19 - 32 mEq/L   Glucose, Bld 78  70 - 99 mg/dL   BUN 30 (*) 6 - 23 mg/dL   Creatinine, Ser 1.81 (*) 0.50 - 1.35 mg/dL   Calcium 9.5  8.4 - 10.5 mg/dL   GFR calc non Af Amer 39 (*) >90 mL/min   GFR calc Af Amer 45 (*) >90 mL/min   Comment: (NOTE)     The eGFR has been calculated using the CKD EPI equation.     This calculation has not been validated in all clinical situations.     eGFR's persistently <90 mL/min signify possible Chronic Kidney     Disease.   Anion gap 11  5 - 15   Comment: Performed at [facility], CAP[facility]     Status: Abnormal   Collection Time    07/03/14 11:27 AM      Result Value Ref Range   Glucose-Capillary 119 (*) 70 - 99 mg/dL   Comment 1 Documented in Chart     Comment 2 Notify RN    GLUCOSE, CAPILLARY     Status: Abnormal   Collection Time    07/03/14  4:43 PM      Result Value Ref Range   Glucose-Capillary 155 (*) 70 - 99 mg/dL  GLUCOSE, CAPILLARY     Status: Abnormal   Collection Time    07/03/14  8:34 PM      Result Value Ref Range   Glucose-Capillary 127 (*) 70 - 99 mg/dL  BASIC METABOLIC PANEL     Status: Abnormal   Collection Time    07/04/14  6:45 AM      Result Value Ref Range   Sodium 135 (*) 137 - 147 mEq/L   Potassium 4.2  3.7 - 5.3 mEq/L   Chloride 94 (*) 96 - 112 mEq/L   CO2 32  19 - 32 mEq/L   Glucose, Bld 97  70 - 99 mg/dL   BUN 32 (*) 6 - 23 mg/dL   Creatinine, Ser 1.64 (*) 0.50 - 1.35 mg/dL   Calcium 9.5  8.4 - 10.5 mg/dL   GFR calc  non Af Amer 44 (*) >90 mL/min   GFR calc Af Amer 51 (*) >90 mL/min   Comment: (NOTE)     The eGFR has been calculated using the CKD EPI equation.     This calculation has not been validated in all clinical situations.     eGFR's persistently <90 mL/min signify possible Chronic Kidney     Disease.   Anion gap 9  5 - 15   Comment: Performed at [place], [facility]     Status: None   Collection Time    07/04/14  6:55 AM      Result Value Ref Range   Glucose-Capillary 97  70 - 99 mg/dL    Physical Findings: AIMS: Facial and Oral Movements Muscles of Facial Expression: None, normal Lips and Perioral Area: None, normal Jaw: None, normal Tongue: None, normal,Extremity Movements Upper (arms, wrists, hands, fingers): None, normal Lower (legs, knees, ankles, toes): None, normal, Trunk Movements Neck, shoulders, hips: None,  normal, Overall Severity Severity of abnormal movements (highest score from questions above): None, normal Incapacitation due to abnormal movements: None, normal Patient's awareness of abnormal movements (rate only patient's report): No Awareness, Dental Status Current problems with teeth and/or dentures?: No Does patient usually wear dentures?: No  CIWA:    COWS:     Treatment Plan Summary: Daily contact with patient to assess and evaluate symptoms and progress in treatment Medication management  Plan:  Review of chart, vital signs, medications, and notes. 1-Admit for crisis management and stabilization.  Estimated length of stay 5-7 days past his current stay of 13 2-Individual and group therapy encouraged 3-Medication management for mania to reduce current symptoms to base line and improve the patient's overall level of functioning:  Medications reviewed with the patient and he stated no untoward effects 4-Coping skills for mania developing-- 5-Continue crisis stabilization and management 6-Address health issues--monitoring vital  signs, stable 7-Treatment plan in progress to prevent relapse of mood instability 8-Psychosocial education regarding relapse prevention and self-care 9-Health care follow up as needed for any health concerns 10-Call for consult with hospitalist for additional specialty patient services as needed.  Medical Decision Making Problem Points:  Review of last therapy session (1) and Review of psycho-social stressors (1) Data Points:  Review of medication regiment & side effects (2)  I certify that inpatient services furnished can reasonably be expected to improve the patient's condition.   Waylan Boga, [place] 07/04/2014, 8:33 AM

## 2014-07-04 NOTE — Progress Notes (Signed)
D: Pt pleasant on approach. Pt is sitting in dayroom interacting with sitter and peers. Pt is hyperverbal and grandiose offering staff monetary gifts. Pt is easily redirectable. A: medication administered as prescribed. R:pt is safe. 1:1 continues

## 2014-07-04 NOTE — [facility] Group Notes (Signed)
[facility] Group Notes:  (Nursing/MHT/Case Management/Adjunct)  Date:  07/04/2014  Time:  9:38 AM  Type of Therapy:  Psychoeducational Skills  Participation Level:  Active  Participation Quality:  Appropriate  Affect:  Appropriate  Cognitive:  Alert and Appropriate  Insight:  Appropriate  Engagement in Group:  Improving  Modes of Intervention:  Clarification and Socialization  Summary of Progress/Problems: Morning wellness group; Participated well; knows unit rules  Andres Ege 07/04/2014, 9:38 AM

## 2014-07-04 NOTE — Progress Notes (Signed)
1:1 notes  07/04/2014 @ 0600 D: Patient in bed sleeping. Respiration regular and unlabored. No sign of distress noted at this time A: 1:1 observation for safety R: Patient is asleep. Sitter at bedside. 1:1 continues

## 2014-07-04 NOTE — Progress Notes (Signed)
[facility] PROGRESS NOTE  Christian [name] MKL:[id number] DOB: [birth year] DOA: 06/21/2014 PCP: Leanor Rubenstein, MD  Brief narrative: [facility] consulted for management of chronic medica lconditions including DM, HTN, HLD, diastolic CHF, LE edema.   Will sign off for now, please call me if you have any additional questions or concerns.   [name], MD  [facility] Pager [phone] Cell [phone]  If 7PM-7AM, please contact night-coverage [email or web address] Password [email or web address] 07/04/2014, 12:13 PM   LOS: 13 days   Active Problems:  Hypertension  - currently on Lasix, Hydralazine, Metoprolol  - SBP in 110 - 120's  - Losartan stopped until renal function stabilizes  - continue other antihypertensive regimen noted above  Acute on chronic diastolic CHF  - 2 D ECHO August 3rd, normal EF 60% - continue Lasix  - weight is trending down: 274 lbs --> 272 lbs Type II or unspecified type diabetes mellitus with unspecified complication, uncontrolled  - reasonable inpatient control  - continue SSI while inpatient, upon discharge may resume glipizide as per home medical regimen  Other and unspecified hyperlipidemia  - stable, continue statin  Bilateral lower extremity edema: chronic with venous stasis changes  - improving overall, continue lasix as noted above  Acute on chronic renal failure  - Cr trending up: 1.6 --> 1.8 --> 1.6 - stopped losartan until renal function stabilizes Gout  - stable, continue colchicine  Bipolar affective, manic  - management per primary team   Basic Metabolic Panel:  Recent Labs Lab 07/01/14 2009 07/03/14 0625 07/04/14 0645  NA 136* 136* 135*  K 4.5 4.2 4.2  CL 96 94* 94*  CO2 30 31 32  GLUCOSE 72 78 97  BUN 30* 30* 32*  CREATININE 1.65* 1.81* 1.64*  CALCIUM 9.3 9.5 9.5   Liver Function Tests:  Recent Labs Lab 07/01/14 2009  AST 26  ALT 25  ALKPHOS 59  BILITOT 0.2*  PROT 7.1  ALBUMIN 3.3*    Scheduled Meds: . allopurinol  300 mg Oral Daily   . ARIPiprazole  10 mg Oral BID PC  . ARIPiprazole  5 mg Oral Daily  . aspirin EC  81 mg Oral Daily  . atorvastatin  10 mg Oral Daily  . colchicine  0.6 mg Oral BID  . divalproex  1,000 mg Oral BID PC  . furosemide  80 mg Oral BID  . guaiFENesin  600 mg Oral BID  . hydrALAZINE  50 mg Oral 4 times per day  . insulin aspart  0-9 Units Subcutaneous TID WC  . LORazepam  1 mg Oral TID PC  . metoprolol  200 mg Oral Daily  . traZODone  200 mg Oral QHS   Continuous Infusions:

## 2014-07-04 NOTE — Progress Notes (Signed)
Adult Psychoeducational Group Note  Date:  07/04/2014 Time:  10:31 PM  Group Topic/Focus:  Wrap-Up Group:   The focus of this group is to help patients review their daily goal of treatment and discuss progress on daily workbooks.  Participation Level:  Active  Participation Quality:  Appropriate  Affect:  Appropriate  Cognitive:  Appropriate  Insight: Appropriate  Engagement in Group:  Engaged  Modes of Intervention:  Discussion  Additional Comments:    Miley Lindon A 07/04/2014, 10:31 PM

## 2014-07-04 NOTE — Tx Team (Signed)
  Interdisciplinary Treatment Plan Update   Date Reviewed:  07/04/2014  Time Reviewed:  10:36 AM  Progress in Treatment:   Attending groups: Yes Participating in groups: Yes Taking medication as prescribed: Yes  Tolerating medication: Yes Family/Significant other contact made: Yes  Patient understands diagnosis: Yes  Discussing patient identified problems/goals with staff: Yes Medical problems stabilized or resolved: Yes Denies suicidal/homicidal ideation: Yes Patient has not harmed self or others: Yes  For review of initial/current patient goals, please see plan of care.  Estimated Length of Stay:  4-5 days  Reason for Continuation of Hospitalization: Delusions  Mania Medication stabilization  New Problems/Goals identified:  N/A  Discharge Plan or Barriers:   on wait list for [facility]  Additional Comments:  Patient rambles during assessment, difficult to redirect. Hypomanic symptoms remain. He has a list for discharge and threatens with an attorney if he is not released. When he was told he would not be discharged today, he got upset with increase voice tone but was able to calm himself down. Then, he became tearful--staff very supportive and encouraging to the patient. He does admit to saying things he shouldn't and getting upset. Christian [name] states he is working on this.   Attendees:  Signature: Thedore Mins, MD 07/04/2014 10:36 AM   Signature: Richelle Ito, LCSW 07/04/2014 10:36 AM  Signature: [name], NP 07/04/2014 10:36 AM  Signature: Joslyn Devon, RN 07/04/2014 10:36 AM  Signature: Liborio Nixon, RN 07/04/2014 10:36 AM  Signature:  07/04/2014 10:36 AM  Signature:   07/04/2014 10:36 AM  Signature:    Signature:    Signature:    Signature:    Signature:    Signature:      Scribe for Treatment Team:   Richelle Ito, LCSW  07/04/2014 10:36 AM

## 2014-07-04 NOTE — [facility] Group Notes (Signed)
[facility] Group Notes:  (Counselor/Nursing/MHT/Case Management/Adjunct)  07/04/2014 1:15PM  Type of Therapy:  Group Therapy  Participation Level:  Active  Participation Quality:  Appropriate  Affect:  Flat  Cognitive:  Oriented  Insight:  Improving  Engagement in Group:  Limited  Engagement in Therapy:  Limited  Modes of Intervention:  Discussion, Exploration and Socialization  Summary of Progress/Problems: The topic for group was balance in life.  Pt participated in the discussion about when their life was in balance and out of balance and how this feels.  Pt discussed ways to get back in balance and short term goals they can work on to get where they want to be.  Christian [name] was in a good mood.  He was able to laugh at himself today, and took responsibility for his negative behavior.  He continues to talk over others and others lose patience with him after awhile.  He feels that he is balanced today "because I have a good 1:1 today.   Christian [name] B 07/04/2014 3:01 PM

## 2014-07-04 NOTE — Progress Notes (Signed)
Patient ID: JITSUO HASHIM, male   DOB: Apr 10, 1951, 63 y.o.   MRN: [id number]  D: Patient pleasant on approach. He is hyper verbal and grandiose this am. Patient reports that he is going to give undersigned $125,000 dollars from his settlement that he will win against the police department and [facility]. He says that if I don't get the money in three days he will pay out of his own account. Patient doesn't listen to reasoning when making all these statements. He reports that he is going to try to do well and not let others make him mad today. Does still report he wants to leave and thinks that he will leave today. Social worker reports not leaving today. Able to stand up pretty well today but still needs some assistance at times. Placed TED hose on feet today. A: Staff will continue to monitor on 1:1 for fall risk and intrusive behaviors  R: Cooperative with 1:1 staff at present.

## 2014-07-05 LAB — GLUCOSE, CAPILLARY
Glucose-Capillary: 118 mg/dL — ABNORMAL HIGH (ref 70–99)
Glucose-Capillary: 147 mg/dL — ABNORMAL HIGH (ref 70–99)
Glucose-Capillary: 162 mg/dL — ABNORMAL HIGH (ref 70–99)
Glucose-Capillary: 117 mg/dL — ABNORMAL HIGH (ref 70–99)

## 2014-07-05 LAB — VALPROIC ACID LEVEL: Valproic Acid Lvl: 64.3 ug/mL (ref 50.0–100.0)

## 2014-07-05 MED ORDER — ARIPIPRAZOLE 15 MG PO TABS
15.0000 mg | ORAL_TABLET | Freq: Two times a day (BID) | ORAL | Status: DC
  Administered 2014-07-05 – 2014-07-16 (×22): 15 mg via ORAL
  Filled 2014-07-05 (×4): qty 28
  Filled 2014-07-05 (×6): qty 1
  Filled 2014-07-05: qty 28
  Filled 2014-07-05 (×15): qty 1
  Filled 2014-07-05: qty 28
  Filled 2014-07-05: qty 1

## 2014-07-05 NOTE — Progress Notes (Signed)
Patient ID: RUBEN ALTIZER, male   DOB: [birth year], 63 y.o.   MRN: [id number]   D: Patient up at medication window this am. Mood labile this morning and telling others in the hallway to be quiet while he is talking. Saying he lost 13 lbs since yesterday but it may be due to his fluid fluctuations. Denies feeling depressed or having any SI at this time. Denies a/v hallucinations. Does still continue to have paranoia and grandiose delusions about him getting a lot of money. He reports sleeping fair but reports not eating much this am. Offered him a snack but patient got aggravated and said never mind. Still needs to be told to be careful when getting up and down. Not locking his wheelchair properly. Offered a walker but patient refused saying that he needs tennis balls on it and complained about why we don't have the money to get tennis balls. A: Staff will be monitored on 1:1 for fall risk and intrusive behavior. R: Cooperative with 1:1 at this time.

## 2014-07-05 NOTE — [facility] Group Notes (Signed)
[facility] LCSW Group Therapy  07/05/2014  1:05 PM  Type of Therapy:  Group therapy  Participation Level:  Active  Participation Quality:  Attentive  Affect:  Flat  Cognitive:  Oriented  Insight:  Limited  Engagement in Therapy:  Limited  Modes of Intervention:  Discussion, Socialization  Summary of Progress/Problems:  Chaplain was here to lead a group on themes of hope and courage.Christian [name] entered the room announcing that he has been a behavior problem in other groups.  He laughed and sat down without incident, and nearly immediately needed to be redirected because of negatively engaging with a peer.  This went on throughout the group.  Promised another peer money to open a [employer].    Christian [name] 07/05/2014 1:25 PM

## 2014-07-05 NOTE — Progress Notes (Signed)
[facility] MD Progress Note  07/05/2014 12:27 PM Christian [name]  MRN:  [id number]  Subjective:   I'm trying to be calm but people messing up with me.    Objective Patient seen chart reviewed.  Patient remains very irritable, hypomanic and disorganized.  His his speech is very fast and rambling.  He is taking his medication but he gets very easily upset.  He has limited participation in the groups.  He admitted he has anger issues.  He is very emotional and labile.  He requires constant redirection to use wheelchair.  His edema is getting better.  He has mild tremors in his hand.  He is on one-to-one observation.  Diagnosis:   DSM5:  Total Time spent with patient: 20 minutes  Axis I: Bipolar, Manic Axis II: Deferred Axis III:  Past Medical History  Diagnosis Date  . Gout   . Hypertension   . Mood swings   . Type II or unspecified type diabetes mellitus with unspecified complication, uncontrolled 06/02/2014  . Other and unspecified hyperlipidemia 06/02/2014  . OSA on CPAP 06/02/2014  . 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 Bipolar disorder   . CHF (congestive heart failure)   . CKD (chronic kidney disease), stage III    Axis IV: other psychosocial or environmental problems, problems related to social environment and problems with primary support group Axis V: 21-30 behavior considerably influenced by delusions or hallucinations OR serious impairment in judgment, communication OR inability to function in almost all areas  ADL's:  Intact  Sleep: Fair  Appetite:  Good  Suicidal Ideation:  Denies Homicidal Ideation:  Denies  Psychiatric Specialty Exam: Physical Exam  Constitutional: He is oriented to person, place, and time. He appears well-developed and well-nourished.  HENT:  Head: Normocephalic and atraumatic.  Neck: Normal range of motion.  Respiratory: Effort normal.  Musculoskeletal: Normal range of motion.  Neurological: He is alert  and oriented to person, place, and time.  Skin: Skin is warm and dry.    Review of Systems  Psychiatric/Behavioral: Positive for hallucinations. The patient has insomnia.        Labile, hypomanic    Blood pressure 136/78, pulse 72, temperature 97.9 F (36.6 C), temperature source Oral, resp. rate 18, height 6' (1.829 m), weight 272 lb (123.378 kg), SpO2 99.00%.Body mass index is 36.88 kg/(m^2).  General Appearance: Casual  Eye Contact::  Fair  Speech:  slightly pressured  Volume:  Increased  Mood:  Anxious and Irritable  Affect:  Blunt  Thought Process:  Irrelevant and Tangential  Orientation:  Full (Time, Place, and Person)  Thought Content:  Obsessions  Suicidal Thoughts:  No  Homicidal Thoughts:  No  Memory:  Immediate;   Fair Recent;   Fair Remote;   Fair  Judgement:  Poor  Insight:  Lacking  Psychomotor Activity:  Increased  Concentration:  Fair  Recall:  [place] of Knowledge:Good  Language: Good  Akathisia:  No  Handed:  Right  AIMS (if indicated):     Assets:  Housing Leisure Time Resilience Social Support  Sleep:  Number of Hours: 3.5   Musculoskeletal: Strength & Muscle Tone: within normal limits Gait & Station: unsteady Patient leans: N/A  Current Medications: Current Facility-Administered Medications  Medication Dose Route Frequency Provider Last Rate Last Dose  . acetaminophen (TYLENOL) tablet 650 mg  650 mg Oral Q6H PRN Elmarie Shiley, NP   650 mg at 07/05/14 0511  . allopurinol (ZYLOPRIM) tablet  300 mg  300 mg Oral Daily Elmarie Shiley, NP   300 mg at 07/05/14 0102  . alum & mag hydroxide-simeth (MAALOX/MYLANTA) 200-200-20 MG/5ML suspension 30 mL  30 mL Oral Q4H PRN Elmarie Shiley, NP      . ARIPiprazole (ABILIFY) tablet 10 mg  10 mg Oral BID PC [name]   10 mg at 07/05/14 0811  . ARIPiprazole (ABILIFY) tablet 5 mg  5 mg Oral Daily Nena Polio, PA-C   5 mg at 07/05/14 1213  . aspirin EC tablet 81 mg  81 mg Oral Daily Janece Canterbury, MD   81 mg at  07/05/14 7253  . atorvastatin (LIPITOR) tablet 10 mg  10 mg Oral Daily Elmarie Shiley, NP   10 mg at 07/05/14 0811  . colchicine tablet 0.6 mg  0.6 mg Oral BID Elmarie Shiley, NP   0.6 mg at 07/05/14 6644  . divalproex (DEPAKOTE ER) 24 hr tablet 1,000 mg  1,000 mg Oral BID PC Elmarie Shiley, NP   1,000 mg at 07/05/14 0811  . furosemide (LASIX) tablet 80 mg  80 mg Oral BID Janece Canterbury, MD   80 mg at 07/05/14 0347  . guaiFENesin (MUCINEX) 12 hr tablet 600 mg  600 mg Oral BID [name]   600 mg at 07/05/14 4259  . hydrALAZINE (APRESOLINE) tablet 50 mg  50 mg Oral 4 times per day Elmarie Shiley, NP   50 mg at 07/05/14 1214  . insulin aspart (novoLOG) injection 0-9 Units  0-9 Units Subcutaneous TID WC Eugenie Filler, MD   2 Units at 07/05/14 1215  . LORazepam (ATIVAN) tablet 1 mg  1 mg Oral TID PC Nena Polio, PA-C   1 mg at 07/05/14 5638  . magnesium hydroxide (MILK OF MAGNESIA) suspension 30 mL  30 mL Oral Daily PRN Elmarie Shiley, NP      . metoprolol succinate (TOPROL-XL) 24 hr tablet 200 mg  200 mg Oral Daily Elmarie Shiley, NP   200 mg at 07/05/14 0811  . traZODone (DESYREL) tablet 200 mg  200 mg Oral QHS Elmarie Shiley, NP   200 mg at 07/04/14 2135    Lab Results:  Results for orders placed during the hospital encounter of 06/21/14 (from the past 48 hour(s))  GLUCOSE, CAPILLARY     Status: Abnormal   Collection Time    07/03/14  4:43 PM      Result Value Ref Range   Glucose-Capillary 155 (*) 70 - 99 mg/dL  GLUCOSE, CAPILLARY     Status: Abnormal   Collection Time    07/03/14  8:34 PM      Result Value Ref Range   Glucose-Capillary 127 (*) 70 - 99 mg/dL  BASIC METABOLIC PANEL     Status: Abnormal   Collection Time    07/04/14  6:45 AM      Result Value Ref Range   Sodium 135 (*) 137 - 147 mEq/L   Potassium 4.2  3.7 - 5.3 mEq/L   Chloride 94 (*) 96 - 112 mEq/L   CO2 32  19 - 32 mEq/L   Glucose, Bld 97  70 - 99 mg/dL   BUN 32 (*) 6 - 23 mg/dL   Creatinine, Ser 1.64 (*) 0.50 - 1.35 mg/dL    Calcium 9.5  8.4 - 10.5 mg/dL   GFR calc non Af Amer 44 (*) >90 mL/min   GFR calc Af Amer 51 (*) >90 mL/min   Comment: (NOTE)     The eGFR has  been calculated using the CKD EPI equation.     This calculation has not been validated in all clinical situations.     eGFR's persistently <90 mL/min signify possible Chronic Kidney     Disease.   Anion gap 9  5 - 15   Comment: Performed at [place], CAPILLARY     Status: None   Collection Time    07/04/14  6:55 AM      Result Value Ref Range   Glucose-Capillary 97  70 - 99 mg/dL  GLUCOSE, CAPILLARY     Status: Abnormal   Collection Time    07/04/14 11:26 AM      Result Value Ref Range   Glucose-Capillary 111 (*) 70 - 99 mg/dL   Comment 1 Documented in Chart     Comment 2 Notify RN    GLUCOSE, CAPILLARY     Status: Abnormal   Collection Time    07/04/14  5:07 PM      Result Value Ref Range   Glucose-Capillary 138 (*) 70 - 99 mg/dL  GLUCOSE, CAPILLARY     Status: Abnormal   Collection Time    07/04/14  8:52 PM      Result Value Ref Range   Glucose-Capillary 157 (*) 70 - 99 mg/dL  GLUCOSE, CAPILLARY     Status: Abnormal   Collection Time    07/05/14  5:55 AM      Result Value Ref Range   Glucose-Capillary 118 (*) 70 - 99 mg/dL   Comment 1 Notify RN     Comment 2 Documented in Chart    GLUCOSE, CAPILLARY     Status: Abnormal   Collection Time    07/05/14 11:33 AM      Result Value Ref Range   Glucose-Capillary 147 (*) 70 - 99 mg/dL    Physical Findings: AIMS: Facial and Oral Movements Muscles of Facial Expression: None, normal Lips and Perioral Area: None, normal Jaw: None, normal Tongue: None, normal,Extremity Movements Upper (arms, wrists, hands, fingers): None, normal Lower (legs, knees, ankles, toes): None, normal, Trunk Movements Neck, shoulders, hips: None, normal, Overall Severity Severity of abnormal movements (highest score from questions above): None, normal Incapacitation due to  abnormal movements: None, normal Patient's awareness of abnormal movements (rate only patient's report): No Awareness, Dental Status Current problems with teeth and/or dentures?: No Does patient usually wear dentures?: No  CIWA:    COWS:     Treatment Plan and Summary: Daily contact with patient to assess and evaluate symptoms and progress in treatment Medication management  Plan:   Increase Abilify 15 mg twice a day.  Continue Depakote present dose.  Patient remains very labile intrusive and emotional.  Encouraged to participate in group and followup Depakote level tomorrow morning.    Medical Decision MakingProblem Points:  Established problem, worsening (2), Review of last therapy session (1) and Review of psycho-social stressors (1) Data Points:  Review of medication regiment & side effects (2)  I certify that inpatient services furnished can reasonably be expected to improve the patient's condition.   ARFEEN,SYED T., 07/05/2014, 12:27 PM

## 2014-07-05 NOTE — Progress Notes (Signed)
Patient ID: Christian [name], male   DOB: 12-22-1950, 63 y.o.   MRN: [id number] 1:1 notes  07/05/2014 @ 0600  D: Patient in bed awake. Pt sitting in wheelchairs writing. Pt c/o bil shoulder pain. Pt rated pain as 9 on a scale of 0-10.  No sign of distress noted at this time A: 1:1 observation for safety. Medication administered as prescribed. R: Patient is safe. Sitter at bedside. 1:1 continues

## 2014-07-05 NOTE — Progress Notes (Signed)
Patient ID: Christian [name], male   DOB: [birth year], 63 y.o.   MRN: [id number] 1:1 notes  07/05/2014 @ 2200  D: during visiting hours patient in dayroom visiting with wife and daughter. Pt labile telling another pt to "shut up" when he coughed. Pt quickly apologized. Pt is currently in bed asleep. Respiration regular and unlabored.  A: 1:1 observation for safety R: Patient is safe. Sitter at bedside. 1:1 continues

## 2014-07-05 NOTE — Progress Notes (Signed)
Christian [name] remains disoriented, delusional and unable to take care of himself independently A He takes his meds as scheduled . He  Remains delusional  And on 1:1 for high fall risk and safety. R Safety is in place and poc maintaiend.

## 2014-07-05 NOTE — Progress Notes (Signed)
Christian [name] is seen out in the milieu, he is animated and speaks to all that he sees. HE frequently suggests he give whomever he is speaking to a contribution for their favorite contribution. HE remains fixated on this delusion...that he can give anybody he chooses large sums of money. He is flirtatious and he speaks in an exaggerrated slow manner.   A He contracts with this nurse for safety but  He will not answer the questions on his morning assessment, so it remains unfinished. He takes his medications as scheduled, along with his insulin. He allows his cbgs to be checked per MD order. He is seen pushing himself around the unit, via his  Wheelchair and this nurse observed him get out of his wheelchair ( while in his room) and he does this slowly .He shuffles around his room, per 1;1 sitter, unaided.   R  Safety is in place, poc cont and 1:1 in place.

## 2014-07-05 NOTE — Progress Notes (Signed)
Patient ID: Christian [name], male   DOB: 08-Jul-1951, 62 y.o.   MRN: [id number] 1:1 notes  07/05/2014 @ 0200  D: Patient in bed sleeping. Respiration regular and unlabored. No sign of distress noted at this time A: 1:1 observation for safety R: Patient is asleep. Sitter at bedside. 1:1 continues

## 2014-07-05 NOTE — [facility] Group Notes (Signed)
[facility] LCSW Aftercare Discharge Planning Group Note   07/05/2014 8:57 AM  Participation Quality:  Engaged  Mood/Affect:  Flat  Depression Rating:  Denies  Anxiety Rating:  denies  Thoughts of Suicide:  No Will you contract for safety?   NA  Current AVH:  denies  Plan for Discharge/Comments:  Christian [name] talked about a visit with his sister [name] that came to visit last night.  She brought him some clothes that made him very happy.  He is subdued, able to keep from getting baited by another patient, and his mood is good.  He talked about going to [employer] to play football, and then having a "mania episode" but still staying in school.  "It was harder getting into school than getting out."  Transportation Means:   Supports:  [name]

## 2014-07-05 NOTE — Progress Notes (Signed)
Adult Psychoeducational Group Note  Date:  07/05/2014 Time:  900 pm  Group Topic/Focus:  Wrap-Up Group:   The focus of this group is to help patients review their daily goal of treatment and discuss progress on daily workbooks.  Participation Level:  Did Not Attend  Participation Quality:  None  Affect:  None  Cognitive:  None  Insight: None  Engagement in Group:  None  Modes of Intervention:  None  Additional Comments:  Pt did not attend group.  Marvis Moeller A 07/05/2014, 9:30 PM

## 2014-07-06 LAB — GLUCOSE, CAPILLARY
Glucose-Capillary: 81 mg/dL (ref 70–99)
Glucose-Capillary: 149 mg/dL — ABNORMAL HIGH (ref 70–99)
Glucose-Capillary: 107 mg/dL — ABNORMAL HIGH (ref 70–99)
Glucose-Capillary: 160 mg/dL — ABNORMAL HIGH (ref 70–99)

## 2014-07-06 NOTE — Progress Notes (Signed)
1:1 progress note 1400 Pt has been sleeping off and on and has remained calm and appropriate thus far.  He did eat a good lunch.  His CBG was 149 which he received 1 unit of SS insulin novolog.  He has been up for the groups today and can be redirected without much incident.  He remains on 1:1 for fall risk/safety.

## 2014-07-06 NOTE — Progress Notes (Signed)
Patient ID: Christian [name], male   DOB: 12/23/1950, 63 y.o.   MRN: [id number] 1:1 notes  07/05/2014 @ 2200  D: pt sitting in wheelchair writing. Pt refuse to get to bed. Pt gets easily agitated when redirected.  A: 1:1 observation for safety R: Patient is safe. Sitter at bedside. 1:1 continues

## 2014-07-06 NOTE — B[facility] Group Notes (Signed)
[facility] Group Notes:  (Clinical Social Work)  07/06/2014  11:00-11:45AM  Summary of Progress/Problems:   The main focus of today's process group was for the patient to identify ways in which they have in the past sabotaged their own recovery and reasons they may have done this/what they received from doing it.  We then worked to identify a specific plan to avoid doing this when discharged from the hospital for this admission.  The patient expressed that he is going to use his ability to remember things to be encouraging to other people by reminding them to go to doctor's appointments and/or appointments to get injections.  He stated his wife prepacks his medications in a pillbox and thus he does not forget to take meds.  He was interested in singing various songs he knows and/or has written, because he knows this Clinical research associate as "the music lady" due to music therapy groups he has been in while in the hospital.  He was tangential and had to be redirected by his MHT, CSW, and other patients to prevent him from interrupting the group with new, unrelated thoughts.  He expressed a number of his delusions thoughts, about having perfect pitch, having a record deal, and more.  Type of Therapy:  Group Therapy - Process  Participation Level:  Active  Participation Quality:  Intrusive, Redirectable and Supportive  Affect:  Blunted  Cognitive:  Delusional  Insight:  Limited  Engagement in Therapy:  Engaged  Modes of Intervention:  Motivational Interviewing, Discussion  Ambrose Mantle, LCSW 07/06/2014, 12:01 PM

## 2014-07-06 NOTE — Progress Notes (Signed)
[facility]  07/06/2014 2:43 PM Christian PeekCharles L [name]  MRN:  [id number]  Subjective:   I want to be discharged.  People are messing up with me.    Objective Patient seen chart reviewed.  The patient continues to have manic symptoms.  He remains irritable grandiose and disorganized.  His speech is fast and rambling.  He requires a lot of encouragement to use his wheelchair .  He continues to get easily upset with the staff however he is not aggressive or violent.  He does not participate in groups.  He is taking his medication with some encouragement.  He remains very labile emotional and difficult to engage in conversation.  His edema is getting better.  Patient requires 1:1to prevent fall.  [facility]  Diagnosis:   DSM5:  Total Time spent with patient: 20 minutes  Axis I: Bipolar, Manic Axis II: Deferred Axis III:  Past Medical History  Diagnosis Date  . Gout   . Hypertension   . Mood swings   . Type II or unspecified type diabetes mellitus with unspecified complication, uncontrolled 06/02/2014  . Other and unspecified hyperlipidemia 06/02/2014  . OSA on CPAP 06/02/2014  . 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 Bipolar disorder   . CHF (congestive heart failure)   . CKD (chronic kidney disease), stage III    Axis IV: other psychosocial or environmental problems, problems related to social environment and problems with primary support group Axis V: 21-30 behavior considerably influenced by delusions or hallucinations OR serious impairment in judgment, communication OR inability to function in almost all areas  ADL's:  Intact  Sleep: Fair  Appetite:  Good  Suicidal Ideation:  Denies Homicidal Ideation:  Denies  Psychiatric Specialty Exam: Physical Exam  Constitutional: He is oriented to person, place, and time. He appears well-developed and well-nourished.  HENT:  Head: Normocephalic and  atraumatic.  Neck: Normal range of motion.  Respiratory: Effort normal.  Musculoskeletal: Normal range of motion.  Neurological: He is alert and oriented to person, place, and time.  Skin: Skin is warm and dry.    Review of Systems  Psychiatric/Behavioral: Positive for hallucinations. The patient has insomnia.        Labile, hypomanic    Blood pressure 120/68, pulse 76, temperature 97.3 F (36.3 C), temperature source Oral, resp. rate 20, height 6' (1.829 m), weight 258 lb (117.028 kg), SpO2 99.00%.Body mass index is 34.98 kg/(m^2).  General Appearance: Casual  Eye Contact::  Fair  Speech:  slightly pressured  Volume:  Increased  Mood:  Anxious and Irritable  Affect:  Blunt  Thought Process:  Irrelevant and Tangential  Orientation:  Full (Time, Place, and Person)  Thought Content:  Obsessions  Suicidal Thoughts:  No  Homicidal Thoughts:  No  Memory:  Immediate;   Fair Recent;   Fair Remote;   Fair  Judgement:  Poor  Insight:  Lacking  Psychomotor Activity:  Increased  Concentration:  Fair  Recall:  Fair  Fund of Knowledge:Good  Language: Good  Akathisia:  No  Handed:  Right  AIMS (if indicated):     Assets:  Housing Leisure Time Resilience Social Support  Sleep:  Number of Hours: 2.5   Musculoskeletal: Strength & Muscle Tone: within normal limits Gait & Station: unsteady Patient leans: N/A  Current Medications: Current Facility-Administered Medications  Medication Dose Route Frequency Provider Last Rate Last Dose  . acetaminophen (TYLENOL) tablet 650  mg  650 mg Oral Q6H PRN [name], NP   650 mg at 07/06/14 1225  . allopurinol (ZYLOPRIM) tablet 300 mg  300 mg Oral Daily [name], NP   300 mg at 07/06/14 7209  . alum & mag hydroxide-simeth (MAALOX/MYLANTA) 200-200-20 MG/5ML suspension 30 mL  30 mL Oral Q4H PRN [name], NP      . ARIPiprazole (ABILIFY) tablet 15 mg  15 mg Oral BID PC Cleotis Nipper, MD   15 mg at 07/06/14 0809  . aspirin EC tablet 81 mg   81 mg Oral Daily [name], MD   81 mg at 07/06/14 1980  . atorvastatin (LIPITOR) tablet 10 mg  10 mg Oral Daily [name], NP   10 mg at 07/06/14 2217  . colchicine tablet 0.6 mg  0.6 mg Oral BID [name], NP   0.6 mg at 07/06/14 9810  . divalproex (DEPAKOTE ER) 24 hr tablet 1,000 mg  1,000 mg Oral BID PC [name], NP   1,000 mg at 07/06/14 0808  . furosemide (LASIX) tablet 80 mg  80 mg Oral BID [name], MD   80 mg at 07/06/14 0809  . guaiFENesin (MUCINEX) 12 hr tablet 600 mg  600 mg Oral BID [name]   600 mg at 07/06/14 0808  . hydrALAZINE (APRESOLINE) tablet 50 mg  50 mg Oral 4 times per day [name], NP   50 mg at 07/06/14 1224  . insulin aspart (novoLOG) injection 0-9 Units  0-9 Units Subcutaneous TID WC [name], MD   1 Units at 07/06/14 1224  . LORazepam (ATIVAN) tablet 1 mg  1 mg Oral TID PC [name], PA-C   1 mg at 07/06/14 1224  . magnesium hydroxide (MILK OF MAGNESIA) suspension 30 mL  30 mL Oral Daily PRN [name], NP      . metoprolol succinate (TOPROL-XL) 24 hr tablet 200 mg  200 mg Oral Daily [name], NP   200 mg at 07/06/14 2548  . traZODone (DESYREL) tablet 200 mg  200 mg Oral QHS [name], NP   200 mg at 07/05/14 2254    Lab Results:  Results for orders placed during the hospital encounter of 06/21/14 (from the past 48 hour(s))  GLUCOSE, CAPILLARY     Status: Abnormal   Collection Time    07/04/14  5:07 PM      Result Value Ref Range   Glucose-Capillary 138 (*) 70 - 99 mg/dL  GLUCOSE, CAPILLARY     Status: Abnormal   Collection Time    07/04/14  8:52 PM      Result Value Ref Range   Glucose-Capillary 157 (*) 70 - 99 mg/dL  GLUCOSE, CAPILLARY     Status: Abnormal   Collection Time    07/05/14  5:55 AM      Result Value Ref Range   Glucose-Capillary 118 (*) 70 - 99 mg/dL   Comment 1 Notify RN     Comment 2 Documented in Chart    GLUCOSE, CAPILLARY     Status: Abnormal   Collection Time    07/05/14 11:33 AM       Result Value Ref Range   Glucose-Capillary 147 (*) 70 - 99 mg/dL  GLUCOSE, CAPILLARY     Status: Abnormal   Collection Time    07/05/14  5:13 PM      Result Value Ref Range   Glucose-Capillary 162 (*) 70 - 99 mg/dL  VALPROIC ACID LEVEL  Status: None   Collection Time    07/05/14  8:09 PM      Result Value Ref Range   Valproic Acid Lvl 64.3  50.0 - 100.0 ug/mL   Comment: Performed at [facility]  GLUCOSE, CAPILLARY     Status: Abnormal   Collection Time    07/05/14  8:23 PM      Result Value Ref Range   Glucose-Capillary 117 (*) 70 - 99 mg/dL  GLUCOSE, CAPILLARY     Status: None   Collection Time    07/06/14  6:44 AM      Result Value Ref Range   Glucose-Capillary 81  70 - 99 mg/dL  GLUCOSE, CAPILLARY     Status: Abnormal   Collection Time    07/06/14 11:22 AM      Result Value Ref Range   Glucose-Capillary 149 (*) 70 - 99 mg/dL    Physical Findings: AIMS: Facial and Oral Movements Muscles of Facial Expression: None, normal Lips and Perioral Area: None, normal Jaw: None, normal Tongue: None, normal,Extremity Movements Upper (arms, wrists, hands, fingers): None, normal Lower (legs, knees, ankles, toes): None, normal, Trunk Movements Neck, shoulders, hips: None, normal, Overall Severity Severity of abnormal movements (highest score from questions above): None, normal Incapacitation due to abnormal movements: None, normal Patient's awareness of abnormal movements (rate only patient's report): No Awareness, Dental Status Current problems with teeth and/or dentures?: No Does patient usually wear dentures?: No  CIWA:    COWS:     Treatment Plan and Summary: Daily contact with patient to assess and evaluate symptoms and progress in treatment Medication management  Plan:   Continue Abilify 15 mg twice a day.  Continue Depakote present dose.  Patient remains very labile intrusive and emotional.  Patient is on [facility] waiting list.   Medical Decision MakingProblem  Points:  Established problem, worsening (2), Review of last therapy session (1) and Review of psycho-social stressors (1) Data Points:  Review of medication regiment & side effects (2)  I certify that inpatient services furnished can reasonably be expected to improve the patient's condition.   Nico Rogness T., 07/06/2014, 2:43 PM

## 2014-07-06 NOTE — Progress Notes (Signed)
Patient ID: Christian [name], male   DOB: 1951/07/30, 63 y.o.   MRN: [id number] 1:1 notes  07/06/2014 @ 0600 D: Patient in bed sleeping. Respiration regular and unlabored. No sign of distress noted at this time:  A: 1:1 observation for safety R: Patient is safe. Sitter at bedside. 1:1 continues

## 2014-07-06 NOTE — Progress Notes (Signed)
1-1 note: D. Pt in room and resting comfortably at this time. Pt had been up and attended group, received a snack that was provided for him, and received all medications this evening without incident. Pt did not verbalize any complaints of pain, was tangential in his thought process and kept speaking about giving people job's and money and raises and buying real estate. Pt does not appear in any acute distress at this time. A. Support and encouragement provided. R. Safety maintained, will continue to monitor.

## 2014-07-06 NOTE — Progress Notes (Signed)
1-1 note: D. Pt in room at this time and preparing for dinner. Pt has been in bed and resting comfortably for much of the afternoon hours. Pt in no acute distress at this time, pt responds appropriately in conversation, pt does still require assistance with ambulation as he appears slow and unsteady on his feet. A. Support and encouragement provided. R. Safety maintained, will continue to monitor.

## 2014-07-06 NOTE — Progress Notes (Signed)
[facility] Group Notes:  (Nursing/MHT/Case Management/Adjunct)  Date:  07/06/2014  Time:  9:49 PM  Type of Therapy:  Psychoeducational Skills  Participation Level:  Minimal  Participation Quality:  Inattentive  Affect:  Flat and Lethargic  Cognitive:  Lacking  Insight:  Limited  Engagement in Group:  Distracting  Modes of Intervention:  Education  Summary of Progress/Problems: The patient was off topic in group this evening. He spoke of attending the music group tomorrow during the day time and wanted one of his peers to sing in the middle of group. The patient also mentioned that he might be discharged on Monday "if I act right". As a theme for the day, his relapse prevention will include going to bed earlier and working on his sleep apnea.   Hazle Coca S 07/06/2014, 9:49 PM

## 2014-07-06 NOTE — Progress Notes (Signed)
1:1 progress note Pt remains hyper-verbal and tangential at times he can be redirected for the most part.  He is on the 1:1 for fall risk safety. He is in a wheelchair and does c/o increased pain in both shoulders which he was given tylenol around 0630 this morning which he stated,"I am still hurting about a 8-9 still.  Encouraged to speak with the doctor today.  He did voice understanding.  He denies any S/H ideation or A/V/H.  Will continue to monitor and will remain on 1:1 for safety.

## 2014-07-06 NOTE — [facility] Group Notes (Signed)
[facility] Group Notes:  (Nursing/MHT/Case Management/Adjunct)  Date:  07/06/2014  Time:  10:49 AM  Type of Therapy:  Nurse Education  Participation Level:  Active  Participation Quality:  Appropriate  Affect:  Appropriate  Cognitive:  Appropriate  Insight:  Limited  Engagement in Group:  Engaged and Supportive  Modes of Intervention:  Activity, Discussion and Education  Summary of Progress/Problems: Pt did attend He talked about how he liked his "stress ball" and his goal "to stay out of trouble today" He plans to work on himself through the groups and other activities here.  Jule Ser 07/06/2014, 10:49 AM

## 2014-07-07 LAB — GLUCOSE, CAPILLARY
Glucose-Capillary: 121 mg/dL — ABNORMAL HIGH (ref 70–99)
Glucose-Capillary: 133 mg/dL — ABNORMAL HIGH (ref 70–99)
Glucose-Capillary: 107 mg/dL — ABNORMAL HIGH (ref 70–99)
Glucose-Capillary: 188 mg/dL — ABNORMAL HIGH (ref 70–99)

## 2014-07-07 NOTE — Progress Notes (Signed)
Patient ID: Christian [name], male   DOB: 1951-03-10, 62 y.o.   MRN: [id number] D)  Still sleeping at this time, eyes closed, resp reg, unlabored, no c/o's voiced. A)  Remains on 1:1 obs for safety, mht at bedside R)  Safety maintained.

## 2014-07-07 NOTE — Progress Notes (Signed)
1:1 progress note Pt continues to require a lot of redirection.  He is hyperverbal and tangential.  He has been in groups intrusive and off topic at times.  He denies any depression,hopelessness or anxiety on his self-inventory.  He denies any S/H ideation or A/V/H.  He remains on 1:1 for safety.

## 2014-07-07 NOTE — [facility] Group Notes (Signed)
[facility] Group Notes:  (Nursing/MHT/Case Management/Adjunct)  Date:  07/07/2014  Time:  11:45 AM  Type of Therapy:  Nurse Education  Participation Level:  Active  Participation Quality:  Attentive and Intrusive  Affect:  Labile  Cognitive:  Delusional and Lacking  Insight:  Lacking and Limited  Engagement in Group:  Defensive, Engaged and Limited  Modes of Intervention:  Activity, Discussion and Education  Summary of Progress/Problems:  Christian [name] 07/07/2014, 11:45 AM

## 2014-07-07 NOTE — Progress Notes (Signed)
[facility] Group Notes:  (Nursing/MHT/Case Management/Adjunct)  Date:  07/07/2014  Time:  9:40 PM  Type of Therapy:  Psychoeducational Skills  Participation Level:  Active  Participation Quality:  Intrusive, Monopolizing and Redirectable  Affect:  Excited  Cognitive:  Disorganized  Insight:  Limited  Engagement in Group:  Distracting and Monopolizing  Modes of Intervention:  Education  Summary of Progress/Problems: The patient shared with the group that he had an "excellent" day, but could not explain. The patient verbalized that he had a complaint about the food and that he had a better day, but jumped from one topic to another. In addition, the patient spoke out of turn on a number of occasions and had to be redirected. 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 As a theme for the day, his support system will consist of his wife.   Hazle Coca S 07/07/2014, 9:40 PM

## 2014-07-07 NOTE — Progress Notes (Signed)
Patient ID: Christian [name], male   DOB: 1951-10-11, 63 y.o.   MRN: [id number] D)  Was awake at the beginning of the shift, but has since dozed off and remains asleep, eyes closed, resp reg, wearing CPAP mask, no c/o's voiced.   A)  Remains on 1:1 obs for safety, continue POC R) Safety maintained.

## 2014-07-07 NOTE — Progress Notes (Signed)
[facility]  07/07/2014 12:20 PM Christian [name]  MRN:  [id number]  Subjective:   I am sleeping better.      Objective Patient seen and chart reviewed.  He remains hypomanic , grandiose , irritable with rambling speech.  He does not feel he has any anger issues.  He endorse that he has bipolar disorder but he has limited insight into his illness.  He gets very irritable and frustrated when he talks about his wife .  He is not going to groups .  He requires constant redirection and encouragement to take the medication.  Patient requires 1:1to prevent fall.  [facility]  Diagnosis:   DSM5:  Total Time spent with patient: 20 minutes  Axis I: Bipolar, Manic Axis II: Deferred Axis III:  Past Medical History  Diagnosis Date  . Gout   . Hypertension   . Mood swings   . Type II or unspecified type diabetes mellitus with unspecified complication, uncontrolled 06/02/2014  . Other and unspecified hyperlipidemia 06/02/2014  . OSA on CPAP 06/02/2014  . 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 Bipolar disorder   . CHF (congestive heart failure)   . CKD (chronic kidney disease), stage III    Axis IV: other psychosocial or environmental problems, problems related to social environment and problems with primary support group Axis V: 21-30 behavior considerably influenced by delusions or hallucinations OR serious impairment in judgment, communication OR inability to function in almost all areas  ADL's:  Intact  Sleep: Fair  Appetite:  Good  Suicidal Ideation:  Denies Homicidal Ideation:  Denies  Psychiatric Specialty Exam: Physical Exam  Constitutional: He is oriented to person, place, and time. He appears well-developed and well-nourished.  HENT:  Head: Normocephalic and atraumatic.  Neck: Normal range of motion.  Respiratory: Effort normal.  Musculoskeletal: Normal range of motion.  Neurological:  He is alert and oriented to person, place, and time.  Skin: Skin is warm and dry.    Review of Systems  Psychiatric/Behavioral: Positive for hallucinations. The patient has insomnia.        Labile, hypomanic    Blood pressure 144/73, pulse 76, temperature 97.4 F (36.3 C), temperature source Oral, resp. rate 20, height 6' (1.829 m), weight 265 lb (120.203 kg), SpO2 100.00%.Body mass index is 35.93 kg/(m^2).  General Appearance: Casual  Eye Contact::  Fair  Speech:  slightly pressured  Volume:  Increased  Mood:  Anxious and Irritable  Affect:  Blunt  Thought Process:  Irrelevant and Tangential  Orientation:  Full (Time, Place, and Person)  Thought Content:  Obsessions  Suicidal Thoughts:  No  Homicidal Thoughts:  No  Memory:  Immediate;   Fair Recent;   Fair Remote;   Fair  Judgement:  Poor  Insight:  Lacking  Psychomotor Activity:  Increased  Concentration:  Fair  Recall:  [employer]  Fund of Knowledge:Good  Language: Good  Akathisia:  No  Handed:  Right  AIMS (if indicated):     Assets:  Housing Leisure Time Resilience Social Support  Sleep:  Number of Hours: 3.25   Musculoskeletal: Strength & Muscle Tone: within normal limits Gait & Station: unsteady Patient leans: N/A  Current Medications: Current Facility-Administered Medications  Medication Dose Route Frequency Provider Last Rate Last Dose  . acetaminophen (TYLENOL) tablet 650 mg  650 mg Oral Q6H PRN Fransisca KaufmannLaura Davis, NP   650 mg at 07/07/14 1216  . allopurinol (  ZYLOPRIM) tablet 300 mg  300 mg Oral Daily [name], NP   300 mg at 07/07/14 0759  . alum & mag hydroxide-simeth (MAALOX/MYLANTA) 200-200-20 MG/5ML suspension 30 mL  30 mL Oral Q4H PRN [name], NP      . ARIPiprazole (ABILIFY) tablet 15 mg  15 mg Oral BID PC Cleotis Nipper, MD   15 mg at 07/07/14 0759  . aspirin EC tablet 81 mg  81 mg Oral Daily [name], MD   81 mg at 07/07/14 0759  . atorvastatin (LIPITOR) tablet 10 mg  10 mg Oral Daily [name], NP   10 mg at 07/07/14 0759  . colchicine tablet 0.6 mg  0.6 mg Oral BID [name], NP   0.6 mg at 07/07/14 0800  . divalproex (DEPAKOTE ER) 24 hr tablet 1,000 mg  1,000 mg Oral BID PC [name], NP   1,000 mg at 07/07/14 0759  . furosemide (LASIX) tablet 80 mg  80 mg Oral BID [name], MD   80 mg at 07/07/14 0759  . guaiFENesin (MUCINEX) 12 hr tablet 600 mg  600 mg Oral BID [name]   600 mg at 07/07/14 0759  . hydrALAZINE (APRESOLINE) tablet 50 mg  50 mg Oral 4 times per day [name], NP   50 mg at 07/07/14 1212  . insulin aspart (novoLOG) injection 0-9 Units  0-9 Units Subcutaneous TID WC [name], MD   1 Units at 07/07/14 1212  . LORazepam (ATIVAN) tablet 1 mg  1 mg Oral TID PC [name], PA-C   1 mg at 07/07/14 1212  . magnesium hydroxide (MILK OF MAGNESIA) suspension 30 mL  30 mL Oral Daily PRN [name], NP      . metoprolol succinate (TOPROL-XL) 24 hr tablet 200 mg  200 mg Oral Daily [name], NP   200 mg at 07/07/14 0759  . traZODone (DESYREL) tablet 200 mg  200 mg Oral QHS [name], NP   200 mg at 07/06/14 2127    Lab Results:  Results for orders placed during the hospital encounter of 06/21/14 (from the past 48 hour(s))  GLUCOSE, CAPILLARY     Status: Abnormal   Collection Time    07/05/14  5:13 PM      Result Value Ref Range   Glucose-Capillary 162 (*) 70 - 99 mg/dL  VALPROIC ACID LEVEL     Status: None   Collection Time    07/05/14  8:09 PM      Result Value Ref Range   Valproic Acid Lvl 64.3  50.0 - 100.0 ug/mL   Comment: Performed at [facility]  GLUCOSE, CAPILLARY     Status: Abnormal   Collection Time    07/05/14  8:23 PM      Result Value Ref Range   Glucose-Capillary 117 (*) 70 - 99 mg/dL  GLUCOSE, CAPILLARY     Status: None   Collection Time    07/06/14  6:44 AM      Result Value Ref Range   Glucose-Capillary 81  70 - 99 mg/dL  GLUCOSE, CAPILLARY     Status: Abnormal   Collection Time    07/06/14 11:22  AM      Result Value Ref Range   Glucose-Capillary 149 (*) 70 - 99 mg/dL  GLUCOSE, CAPILLARY     Status: Abnormal   Collection Time    07/06/14  5:03 PM      Result Value Ref Range  Glucose-Capillary 107 (*) 70 - 99 mg/dL  GLUCOSE, CAPILLARY     Status: Abnormal   Collection Time    07/06/14  8:57 PM      Result Value Ref Range   Glucose-Capillary 160 (*) 70 - 99 mg/dL  GLUCOSE, CAPILLARY     Status: Abnormal   Collection Time    07/07/14  6:42 AM      Result Value Ref Range   Glucose-Capillary 121 (*) 70 - 99 mg/dL  GLUCOSE, CAPILLARY     Status: Abnormal   Collection Time    07/07/14 11:14 AM      Result Value Ref Range   Glucose-Capillary 133 (*) 70 - 99 mg/dL    Physical Findings: AIMS: Facial and Oral Movements Muscles of Facial Expression: None, normal Lips and Perioral Area: None, normal Jaw: None, normal Tongue: None, normal,Extremity Movements Upper (arms, wrists, hands, fingers): None, normal Lower (legs, knees, ankles, toes): None, normal, Trunk Movements Neck, shoulders, hips: None, normal, Overall Severity Severity of abnormal movements (highest score from questions above): None, normal Incapacitation due to abnormal movements: None, normal Patient's awareness of abnormal movements (rate only patient's report): No Awareness, Dental Status Current problems with teeth and/or dentures?: No Does patient usually wear dentures?: No  CIWA:    COWS:     Treatment Plan and Summary: Daily contact with patient to assess and evaluate symptoms and progress in treatment Medication management  Plan:   Patient remains inpatient psychiatric treatment and stabilization.  He remains very labile, intrusive, manic and has disorganized thinking.  He has no tremors or shakes.  Continue Abilify 15 mg twice a day.  Patient is on [facility] waiting list.   Medical Decision MakingProblem Points:  Established problem, worsening (2), Review of last therapy session (1) and Review of  psycho-social stressors (1) Data Points:  Review of medication regiment & side effects (2)  I certify that inpatient services furnished can reasonably be expected to improve the patient's condition.   Lacinda Curvin T., 07/07/2014, 12:20 PM

## 2014-07-07 NOTE — [facility] Group Notes (Signed)
[facility] Group Notes:  (Clinical Social Work)  07/07/2014   11:15am-12:00pm  Summary of Progress/Problems:  The main focus of today's process group was to listen to a variety of genres of music and to identify that different types of music provoke different responses.  The patient then was able to identify personally what was soothing for them, as well as energizing.  Handouts were used to record feelings evoked, as well as how patient can personally use this knowledge in sleep habits, with depression, and with other symptoms.  The patient expressed understanding of concepts, as well as knowledge of how each type of music affected him/her and how this can be used at home as a wellness/recovery tool.  He was restless throughout group, coming in and out of the room, switching seats and standing/sitting alternatively.  His behavior was intrusive, as he talked loudly, was grandiose about his own singing abilities, but the group did not take offense.  He cursed at times and was demanding of his MHT who was 1:1 with him at that time.    Type of Therapy:  Music Therapy   Participation Level:  Active  Participation Quality:  Intrusive, Resistant at times, Sharing  Affect:  Blunted and Tearful and Drowsy  Cognitive:  Delusional  Insight: Limited  Engagement in Therapy:  Engaged  Modes of Intervention:   Activity, Exploration  Ambrose Mantle, LCSW 07/07/2014, 12:30pm

## 2014-07-07 NOTE — Progress Notes (Signed)
1:1 progress note Pt has been arguing with staff at times he can be redirected but takes a lot of effort.  He is intrusive, demanding and is tangential, loud and pressured speech.  He has bathed this morning after breakfast and was taking to exam room to get his daily weight 800 which was 265 lbs.  He is denying any depression, hopelessness or anxiety today.  He denies A/V/H or S/H ideation.  Encouraging pt to keep is BLE elevated and he voices understanding but has to be reminded.  He remains on 1:1 for safety/fall risk.

## 2014-07-07 NOTE — [facility] Group Notes (Signed)
[facility] Group Notes:  (Nursing/MHT/Case Management/Adjunct)  Date:  07/07/2014  Time:  11:48 AM  Type of Therapy:  Psychoeducational Skills  Participation Level:  Active  Participation Quality:  Intrusive and Redirectable  Affect:  Labile  Cognitive:  Disorganized and Delusional  Insight:  Lacking and Limited  Engagement in Group:  Engaged  Modes of Intervention:  Activity, Discussion and Education  Summary of Progress/Problems:  Jule Ser 07/07/2014, 11:48 AM

## 2014-07-07 NOTE — Progress Notes (Signed)
1-1 note: D. Pt up at this time and has been up throughout the evening hours, attending and participating in various milieu activities. Pt attended to personal hygiene, including bathing and shaving with staff assistance. Pt still presents as having tangential thought process, however is more cheerful this evening. Pt also received medications without incident and does not appear to be in any acute distress. A. Support and encouragement provided. R. Safety maintained, will continue to monitor.

## 2014-07-07 NOTE — [facility] Group Notes (Signed)
[facility] Group Notes:  (Nursing/MHT/Case Management/Adjunct)  Date:  07/07/2014  Time:  9:35 AM  Type of Therapy:  Nurse Education  Participation Level:  Active  Participation Quality:  Intrusive, Monopolizing and Redirectable  Affect:  Labile  Cognitive:  Lacking  Insight:  Lacking and Limited  Engagement in Group:  Distracting, Lacking and Limited  Modes of Intervention:  Activity, Discussion and Education  Summary of Progress/Problems:Pt did attend He talked about how he liked his "stress ball" and his goal "to stay out of trouble today" He plans to work on himself through the groups and other activities here.   Jule Ser 07/07/2014, 9:35 AM

## 2014-07-07 NOTE — Progress Notes (Signed)
1-1 note: D. Pt up throughout the day and up at this time and finishing dinner that was brought for him. Pt continues to be hyperverbal and tangential and can become agitated easily but responds appropriately to re-direction. Pt did receive medications this evening without incident and does not appear to be in any acute distress. A. Support and encouragement provided. R. Safety maintained, will continue to monitor.

## 2014-07-08 LAB — GLUCOSE, CAPILLARY
Glucose-Capillary: 121 mg/dL — ABNORMAL HIGH (ref 70–99)
Glucose-Capillary: 152 mg/dL — ABNORMAL HIGH (ref 70–99)
Glucose-Capillary: 134 mg/dL — ABNORMAL HIGH (ref 70–99)
Glucose-Capillary: 174 mg/dL — ABNORMAL HIGH (ref 70–99)

## 2014-07-08 NOTE — Progress Notes (Addendum)
Patient ID: SAY FRIPP, male   DOB: December 23, 1950, 63 y.o.   MRN: [id number]  Patient is seen at the nursing window for medications this morning. Patient reports, "I am not gonna let yall get me upset." Patient has pressured speech and is shaking patient states is due to the door in the medication open and the cold air blowing on him. 1:1 observation continues and MHT is within reach. Q15 minute safety checks maintained until discharge. Patient denies SI/HI and A/V hallucinations.

## 2014-07-08 NOTE — Progress Notes (Signed)
Took over Pt's care at 2330, see 1:1 nursing notes on MHT flow sheet

## 2014-07-08 NOTE — Progress Notes (Signed)
Patient ID: Christian [name], male   DOB: 1951-11-17, 63 y.o.   MRN: [id number]  1:1 Nursing Note-  Patient is in the day room on first approach by Clinical research associate. Patient is sitting and eating his lunch. Patient requests for another piece of chicken at this time. Patient misinterprets what an MHT says and starts to yell but then takes a deep breath and calms down and apologizes. Patient is encouraged to remain calm. MHT is within reach for 1:1 observation. Q15 minute safety checks are maintained as well. There is not physical distress at this time.

## 2014-07-08 NOTE — [facility] Group Notes (Signed)
[facility] LCSW Group Therapy  07/08/2014 1:15 pm  Type of Therapy: Process Group Therapy  Participation Level:  Elpidio was in day room at the beginning of group, but got up when I entered and insisted that he needed to find the Dr "because I need to leave today."  He did not return to group after leaving.  Summary of Progress/Problems: Today's group addressed the issue of overcoming obstacles.  Patients were asked to identify their biggest obstacle post d/c that stands in the way of their on-going success, and then problem solve as to how to manage this.  [name] 07/08/2014   3:17 PM

## 2014-07-08 NOTE — [facility] Group Notes (Signed)
[facility] LCSW Aftercare Discharge Planning Group Note   07/08/2014 10:47 AM  Participation Quality:  Minimal  Mood/Affect:  Irritable  Depression Rating:  denies  Anxiety Rating:  denies  Thoughts of Suicide:  No Will you contract for safety?   NA  Current AVH:  No  Plan for Discharge/Comments:  "I'm ready to go home.  My wife wants you to call her."  States his weekend was OK; the food is bad.  Wife visited, but apparently said nothing to him about not allowing him to return home.  Transportation Means: family  Supports: family  [place], Baldo Daub

## 2014-07-08 NOTE — Progress Notes (Signed)
Pt continues to have some confusion and mood lability   He said he wants to get out of here tomorrow and if he doesn't he will tear the place up  That we would be seeing techs on the floor all tore up   He yelled a couple of times then said I will tell you the rest of the story later and went back to writing on a piece of paper   Pt has very swollen feet and an unsteady gait   Pt is on 1:1  And is safe presently

## 2014-07-08 NOTE — Progress Notes (Signed)
Patient ID: Christian [name], male   DOB: Jun 28, 1951, 63 y.o.   MRN: [id number]  1:1 Nursing Note-  Patient is seen in his room sitting in his wheelchair eating cereal. Patient's respirations are even and unlabored at this time. Patient reports that he does not like milk but will drink it and drink a whole pitcher of water and follows this up with, "that's too much information." Patient does not appear in any distress at this time. MHT is within reach and 1:1 is continued. Q15 minute safety checks are also maintained.

## 2014-07-08 NOTE — Progress Notes (Signed)
Pt uncooperative with getting daily weight this AM, yelling at MHT in room.

## 2014-07-08 NOTE — Progress Notes (Signed)
[facility]  07/08/2014 2:42 PM Christian PeekCharles L [name]  MRN:  [id number]  Subjective:   "I am excellent. "  Objective Patient seen and chart reviewed.  He reports he is doing better today. Reports sleep and appetite as good. But continues to remain grandiose with increased speech. Denies SI/HI/AH/VH. Reports would remain calm and cooperative and wants to go home. Denies any side effects of medications.     Collateral information obtained from wife [name] - CSW  Rod was present during phone session.per Gloriajean DellNadine she is not sure about taking him back . But would visit him and let us know.         Diagnosis:   DSM5:  Primary Psychiatric Diagnosis: Bipolar I disorder,current episode Manic  Secondary psychiatric diagnosis: Unspecified neurocognitive disorder  Non Psychiatric diagnosis:  DM. Type II Gout Hypertension OSA on CPAP BL LE edema CKD Hyperlipidemia Diastolic CHF  Total Time spent with patient: 20 minutes    ADL's:  Intact  Sleep: Good  Appetite:  Good  Suicidal Ideation:  Denies Homicidal Ideation:  Denies  Psychiatric Specialty Exam: Physical Exam  Constitutional: He is oriented to person, place, and time. He appears well-developed and well-nourished.  HENT:  Head: Normocephalic and atraumatic.  Neck: Normal range of motion.  Respiratory: Effort normal.  Musculoskeletal: Normal range of motion.  Neurological: He is alert and oriented to person, place, and time.  Skin: Skin is warm and dry.    Review of Systems  Psychiatric/Behavioral:       Labile, hypomanic    Blood pressure 112/74, pulse 86, temperature 98.4 F (36.9 C), temperature source Oral, resp. rate 18, height 6' (1.829 m), weight 119.296 kg (263 lb), SpO2 100.00%.Body mass index is 35.66 kg/(m^2).  General Appearance: Casual    Eye Contact::  Fair  Speech:  slightly pressured  Volume:  Increased  Mood:  good  Affect:  Blunt  Thought Process:  Circumstantial and Tangential   Orientation:  Full (Time, Place, and Person)  Thought Content:  Delusions,grandiose  Suicidal Thoughts:  No  Homicidal Thoughts:  No  Memory:  Immediate;   Fair Recent;   Fair Remote;   Fair  Judgement:  Poor  Insight:  Lacking  Psychomotor Activity:  Increased  Concentration:  Fair  Recall:  Fair  Fund of Knowledge:Good  Language: Good  Akathisia:  No  Handed:  Right  AIMS (if indicated):   0  Assets:  Housing Leisure Time Resilience Social Support  Sleep:  Number of Hours: 6.75   Musculoskeletal: Strength & Muscle Tone: within normal limits Gait & Station: unsteady Patient leans: N/A  Current Medications: Current Facility-Administered Medications  Medication Dose Route Frequency Provider Last Rate Last Dose  . acetaminophen (TYLENOL) tablet 650 mg  650 mg Oral Q6H PRN Fransisca KaufmannLaura Davis, NP   650 mg at 07/08/14 0618  . allopurinol (ZYLOPRIM) tablet 300 mg  300 mg Oral Daily Fransisca KaufmannLaura Davis, NP   300 mg at 07/08/14 [id number]  . alum & mag hydroxide-simeth (MAALOX/MYLANTA) 200-200-20 MG/5ML suspension 30 mL  30 mL Oral Q4H PRN Fransisca KaufmannLaura Davis, NP      . ARIPiprazole (ABILIFY) tablet 15 mg  15 mg Oral BID PC Cleotis NipperSyed T Arfeen, MD   15 mg at 07/08/14 0815  . aspirin EC tablet 81 mg  81 mg Oral Daily Renae FickleMackenzie Short, MD   81 mg at 07/08/14 [id number]  . atorvastatin (LIPITOR) tablet 10 mg  10 mg Oral Daily Fransisca KaufmannLaura Davis, NP   10  mg at 07/08/14 0814  . colchicine tablet 0.6 mg  0.6 mg Oral BID [name], NP   0.6 mg at 07/08/14 5537  . divalproex (DEPAKOTE ER) 24 hr tablet 1,000 mg  1,000 mg Oral BID PC [name], NP   1,000 mg at 07/08/14 0815  . furosemide (LASIX) tablet 80 mg  80 mg Oral BID [name], MD   80 mg at 07/08/14 0815  . guaiFENesin (MUCINEX) 12 hr tablet 600 mg  600 mg Oral BID [name]   600 mg at 07/08/14 0814  . hydrALAZINE (APRESOLINE) tablet 50 mg  50 mg Oral 4 times per day [name], NP   50 mg at 07/08/14 1225  . insulin aspart (novoLOG) injection 0-9 Units  0-9  Units Subcutaneous TID WC [name], MD   2 Units at 07/08/14 1225  . LORazepam (ATIVAN) tablet 1 mg  1 mg Oral TID PC [name], PA-C   1 mg at 07/08/14 1225  . magnesium hydroxide (MILK OF MAGNESIA) suspension 30 mL  30 mL Oral Daily PRN [name], NP      . metoprolol succinate (TOPROL-XL) 24 hr tablet 200 mg  200 mg Oral Daily [name], NP   200 mg at 07/08/14 0814  . traZODone (DESYREL) tablet 200 mg  200 mg Oral QHS [name], NP   200 mg at 07/07/14 2154    Lab Results:  Results for orders placed during the hospital encounter of 06/21/14 (from the past 48 hour(s))  GLUCOSE, CAPILLARY     Status: Abnormal   Collection Time    07/06/14  5:03 PM      Result Value Ref Range   Glucose-Capillary 107 (*) 70 - 99 mg/dL  GLUCOSE, CAPILLARY     Status: Abnormal   Collection Time    07/06/14  8:57 PM      Result Value Ref Range   Glucose-Capillary 160 (*) 70 - 99 mg/dL  GLUCOSE, CAPILLARY     Status: Abnormal   Collection Time    07/07/14  6:42 AM      Result Value Ref Range   Glucose-Capillary 121 (*) 70 - 99 mg/dL  GLUCOSE, CAPILLARY     Status: Abnormal   Collection Time    07/07/14 11:14 AM      Result Value Ref Range   Glucose-Capillary 133 (*) 70 - 99 mg/dL  GLUCOSE, CAPILLARY     Status: Abnormal   Collection Time    07/07/14  4:53 PM      Result Value Ref Range   Glucose-Capillary 107 (*) 70 - 99 mg/dL   Comment 1 Notify RN     Comment 2 Documented in Chart    GLUCOSE, CAPILLARY     Status: Abnormal   Collection Time    07/07/14  8:58 PM      Result Value Ref Range   Glucose-Capillary 188 (*) 70 - 99 mg/dL   Comment 1 Notify RN    GLUCOSE, CAPILLARY     Status: Abnormal   Collection Time    07/08/14  6:08 AM      Result Value Ref Range   Glucose-Capillary 121 (*) 70 - 99 mg/dL  GLUCOSE, CAPILLARY     Status: Abnormal   Collection Time    07/08/14 11:04 AM      Result Value Ref Range   Glucose-Capillary 152 (*) 70 - 99 mg/dL    Physical  Findings: AIMS: Facial and Oral Movements Muscles  of Facial Expression: None, normal Lips and Perioral Area: None, normal Jaw: None, normal Tongue: None, normal,Extremity Movements Upper (arms, wrists, hands, fingers): None, normal Lower (legs, knees, ankles, toes): None, normal, Trunk Movements Neck, shoulders, hips: None, normal, Overall Severity Severity of abnormal movements (highest score from questions above): None, normal Incapacitation due to abnormal movements: None, normal Patient's awareness of abnormal movements (rate only patient's report): No Awareness, Dental Status Current problems with teeth and/or dentures?: No Does patient usually wear dentures?: No  CIWA:    COWS:     Treatment Plan and Summary: Daily contact with patient to assess and evaluate symptoms and progress in treatment Medication management  Assessment and Plan:   Will continue current medications as scheduled. He is much better today,with on and off irritability and labile mood. Sleep improved Will continue Abilify as scheduled. AIMS -0 Will continue Depakote 1000 mg bid. Reviewed labs - depakote level reviewed. Will continue trazodone for sleep.Continue Ativan as scheduled for anxiety/agitation.  Contacted wife Gloriajean Dell - she is not sure about taking him home. CSW will work on placement. [facility] waiting list pending.   Medical Decision MakingProblem Points:  Review of last therapy session (1) and Review of psycho-social stressors (1) Data Points:  Review of medication regiment & side effects (2)  I certify that inpatient services furnished can reasonably be expected to improve the patient's condition.   Elleigh Cassetta, 07/08/2014, 2:42 PM

## 2014-07-08 NOTE — Progress Notes (Signed)
Patient ID: Christian [name], male   DOB: [birth year], 63 y.o.   MRN: [id number]  1:1 Nursing Note-  Patient is in his room yelling at the [facility] about saying he is going to "set it off" if he does not get an answer about the CSW calling his wife or not. Patient reports, "Rod is making me leave threatening messages for my wife because yall are not giving me answers." Patient continues to blame others instead of taking responsibility for himself. Patient reports, "the staff is all in on it" but Clinical research associate unaware of what patient is speaking about. 1:1 observation is continued for safety. [facility] is within reach and Q15 minute safety checks are maintained.

## 2014-07-09 LAB — CBC WITH DIFFERENTIAL/PLATELET
Basophils Absolute: 0 10*3/uL (ref 0.0–0.1)
Basophils Relative: 0 % (ref 0–1)
Eosinophils Absolute: 0.1 10*3/uL (ref 0.0–0.7)
Eosinophils Relative: 4 % (ref 0–5)
HCT: 39.4 % (ref 39.0–52.0)
Hemoglobin: 13 g/dL (ref 13.0–17.0)
Lymphocytes Relative: 48 % — ABNORMAL HIGH (ref 12–46)
Lymphs Abs: 1.4 10*3/uL (ref 0.7–4.0)
MCH: 29.6 pg (ref 26.0–34.0)
MCHC: 33 g/dL (ref 30.0–36.0)
MCV: 89.7 fL (ref 78.0–100.0)
Monocytes Absolute: 0.3 10*3/uL (ref 0.1–1.0)
Monocytes Relative: 12 % (ref 3–12)
Neutro Abs: 1 10*3/uL — ABNORMAL LOW (ref 1.7–7.7)
Neutrophils Relative %: 36 % — ABNORMAL LOW (ref 43–77)
Platelets: 123 10*3/uL — ABNORMAL LOW (ref 150–400)
RBC: 4.39 MIL/uL (ref 4.22–5.81)
RDW: 13.4 % (ref 11.5–15.5)
WBC: 2.8 10*3/uL — ABNORMAL LOW (ref 4.0–10.5)

## 2014-07-09 LAB — GLUCOSE, CAPILLARY
Glucose-Capillary: 121 mg/dL — ABNORMAL HIGH (ref 70–99)
Glucose-Capillary: 139 mg/dL — ABNORMAL HIGH (ref 70–99)
Glucose-Capillary: 151 mg/dL — ABNORMAL HIGH (ref 70–99)

## 2014-07-09 MED ORDER — LAMOTRIGINE 25 MG PO TABS
25.0000 mg | ORAL_TABLET | Freq: Every evening | ORAL | Status: DC
  Administered 2014-07-09: 25 mg via ORAL
  Filled 2014-07-09 (×4): qty 1

## 2014-07-09 MED ORDER — DIVALPROEX SODIUM ER 500 MG PO TB24
1000.0000 mg | ORAL_TABLET | Freq: Every day | ORAL | Status: DC
  Administered 2014-07-10: 1000 mg via ORAL
  Filled 2014-07-09 (×2): qty 2

## 2014-07-09 NOTE — Progress Notes (Signed)
1:1 Note- Pt in room sitting in wheelchair, eating dinner, pt irritable, having trouble staying awake, eyes closed and dozing off during interaction, asks writer if he can go ahead and take his sleep medications, writer explained to pt that they are not ordered for this time and would be given at the scheduled time as per the MD orders, pt was not happy about this states that  "it takes 4 hours for them to work", encouraged pt to speak with the MD about this tomorrow and explained that writer would report this to the oncoming night shift RN, 1:1 observation continued for safety, sitter at bedside.

## 2014-07-09 NOTE — Progress Notes (Signed)
Patient 1:1 Note  D: Patient resting in bed with eyes closed.  Respirations even and unlabored.  Patient appears to be in no apparent distress. A: Staff to monitor Q 15 mins for safety.   R:Patient remains safe on the unit.  

## 2014-07-09 NOTE — Tx Team (Signed)
  Interdisciplinary Treatment Plan Update   Date Reviewed:  07/09/2014  Time Reviewed:  8:03 AM  Progress in Treatment:   Attending groups: Yes Participating in groups: Yes Taking medication as prescribed: Yes  Tolerating medication: Yes Family/Significant other contact made: Yes  Patient understands diagnosis: Yes  Discussing patient identified problems/goals with staff: Yes Medical problems stabilized or resolved: Yes Denies suicidal/homicidal ideation: Yes Patient has not harmed self or others: Yes  For review of initial/current patient goals, please see plan of care.  Estimated Length of Stay:  4-5 days  Reason for Continuation of Hospitalization: Medication stabilization Other; describe confusion, mood lability Delusions  New Problems/Goals identified:  N/A  Discharge Plan or Barriers:   CSW to pursue [facility] for possible placement  Additional Comments:  Last night wife told patient she is not OK with him coming home after multiple threatening VM messages from him.  His response to this is to blame CSW, and then to state that his brother will provide him money to stay in a hotel.  CSW stated that we would pursue placement at the same time as we wanted to make sure he has a back up plan.  In the meantime, he will be cross tapered from depakote to lamictal as his platelet count is low.  Attendees:  Signature: Thedore Mins, MD 07/09/2014 8:03 AM   Signature: Richelle Ito, LCSW 07/09/2014 8:03 AM  Signature: [name], NP 07/09/2014 8:03 AM  Signature: Joslyn Devon, RN 07/09/2014 8:03 AM  Signature: Liborio Nixon, RN 07/09/2014 8:03 AM  Signature:  07/09/2014 8:03 AM  Signature:   07/09/2014 8:03 AM  Signature:    Signature:    Signature:    Signature:    Signature:    Signature:      Scribe for Treatment Team:   Richelle Ito, LCSW  07/09/2014 8:03 AM

## 2014-07-09 NOTE — Progress Notes (Signed)
Christian [name]  MRN:  [id number]  Subjective:   "I am just excellent. "  Objective Patient seen and chart reviewed.  Patient appeared to be agitated ,threatening during the evaluation. Reports that he is trying to remain calm but there is a conspiracy going on and accused staff of breaking apart his family. He reports he wants to go with his wife initially but later on reported that he will work with CSW in finding placement. He continues to tangential,grandiose and delusional. He continues to have labile moods and has impaired judgement as well as impulsivity.  Denies SI/HI/AH/VH.  Denies any side effects of medications.     Per staff notes - patient was agitated last night, appeared to be cussing and threatening and required redirection.    Diagnosis:   DSM5:  Primary Psychiatric Diagnosis: Bipolar I disorder,current episode Manic  Secondary psychiatric diagnosis: Unspecified neurocognitive disorder  Non Psychiatric diagnosis:  DM. Type II Gout Hypertension OSA on CPAP BL LE edema CKD Hyperlipidemia Diastolic CHF  Total Time spent with patient: 20 minutes    ADL's:  Intact  Sleep: Good  Appetite:  Good  Suicidal Ideation:  Denies Homicidal Ideation:  Denies  Psychiatric Specialty Exam: Physical Exam  Constitutional: He is oriented to person, place, and time. He appears well-developed and well-nourished.  HENT:  Head: Normocephalic and atraumatic.  Neck: Normal range of motion.  Respiratory: Effort normal.  Musculoskeletal: Normal range of motion. He exhibits edema.  Neurological: He is alert and oriented to person, place, and time.  Skin: Skin is warm and dry.    Review of Systems  Psychiatric/Behavioral:       Mood lability,anxious,periodic irritability,delusional    Blood pressure 120/72, pulse 74, temperature 98.8 F (37.1 C), temperature source Oral, resp. rate 20, height 6' (1.829 m), weight 118.842 kg  (262 lb), SpO2 99.00%.Body mass index is 35.53 kg/(m^2).  General Appearance: Casual    Eye Contact::  Fair  Speech:  slightly pressured  Volume:  Increased  Mood:  Angry, Anxious and Irritable  Affect:  Labile  Thought Process:  Circumstantial and Tangential  Orientation:  Full (Time, Place, and Person)  Thought Content:  Delusions,grandiose  Suicidal Thoughts:  No  Homicidal Thoughts:  No  Memory:  Immediate;   Fair Recent;   Fair Remote;   Fair  Judgement:  Poor  Insight:  Lacking  Psychomotor Activity:  Increased  Concentration:  Fair  Recall:  Fair  Fund of Knowledge:Good  Language: Good  Akathisia:  No  Handed:  Right  AIMS (if indicated):   0  Assets:  Housing Leisure Time Resilience Social Support  Sleep:  Number of Hours: 4.5   Musculoskeletal: Strength & Muscle Tone: within normal limits Gait & Station: unsteady Patient leans: N/A  Current Medications: Current Facility-Administered Medications  Medication Dose Route Frequency Provider Last Rate Last Dose  . acetaminophen (TYLENOL) tablet 650 mg  650 mg Oral Q6H PRN [name], NP   650 mg at 07/08/14 2036  . allopurinol (ZYLOPRIM) tablet 300 mg  300 mg Oral Daily [name], NP   300 mg at 07/09/14 0825  . alum & mag hydroxide-simeth (MAALOX/MYLANTA) 200-200-20 MG/5ML suspension 30 mL  30 mL Oral Q4H PRN [name], NP      . ARIPiprazole (ABILIFY) tablet 15 mg  15 mg Oral BID PC Cleotis Nipper, MD   15 mg at 07/09/14 0825  . aspirin EC tablet 81 mg  81 mg Oral Daily [name], MD   81 mg at 07/09/14 0826  . atorvastatin (LIPITOR) tablet 10 mg  10 mg Oral Daily [name], NP   10 mg at 07/09/14 0826  . colchicine tablet 0.6 mg  0.6 mg Oral BID [name], NP   0.6 mg at 07/09/14 0825  . [START ON 07/10/2014] divalproex (DEPAKOTE ER) 24 hr tablet 1,000 mg  1,000 mg Oral Daily Zakiah Gauthreaux, MD      . furosemide (LASIX) tablet 80 mg  80 mg Oral BID [name], MD   80 mg at 07/09/14 0826  .  guaiFENesin (MUCINEX) 12 hr tablet 600 mg  600 mg Oral BID [name]   600 mg at 07/09/14 2130  . hydrALAZINE (APRESOLINE) tablet 50 mg  50 mg Oral 4 times per day [name], NP   50 mg at 07/09/14 1231  . insulin aspart (novoLOG) injection 0-9 Units  0-9 Units Subcutaneous TID WC [name], MD   1 Units at 07/09/14 1232  . lamoTRIgine (LAMICTAL) tablet 25 mg  25 mg Oral QPM Eduar Kumpf, MD      . LORazepam (ATIVAN) tablet 1 mg  1 mg Oral TID PC [name], PA-C   1 mg at 07/09/14 1231  . magnesium hydroxide (MILK OF MAGNESIA) suspension 30 mL  30 mL Oral Daily PRN [name], NP      . metoprolol succinate (TOPROL-XL) 24 hr tablet 200 mg  200 mg Oral Daily [name], NP   200 mg at 07/09/14 0826  . traZODone (DESYREL) tablet 200 mg  200 mg Oral QHS [name], NP   200 mg at 07/08/14 2036    Lab Results:  Results for orders placed during the hospital encounter of 06/21/14 (from the past 48 hour(s))  GLUCOSE, CAPILLARY     Status: Abnormal   Collection Time    07/07/14  4:53 PM      Result Value Ref Range   Glucose-Capillary 107 (*) 70 - 99 mg/dL   Comment 1 Notify RN     Comment 2 Documented in Chart    GLUCOSE, CAPILLARY     Status: Abnormal   Collection Time    07/07/14  8:58 PM      Result Value Ref Range   Glucose-Capillary 188 (*) 70 - 99 mg/dL   Comment 1 Notify RN    GLUCOSE, CAPILLARY     Status: Abnormal   Collection Time    07/08/14  6:08 AM      Result Value Ref Range   Glucose-Capillary 121 (*) 70 - 99 mg/dL  GLUCOSE, CAPILLARY     Status: Abnormal   Collection Time    07/08/14 11:04 AM      Result Value Ref Range   Glucose-Capillary 152 (*) 70 - 99 mg/dL  GLUCOSE, CAPILLARY     Status: Abnormal   Collection Time    07/08/14  4:46 PM      Result Value Ref Range   Glucose-Capillary 134 (*) 70 - 99 mg/dL   Comment 1 Documented in Chart     Comment 2 Notify RN    GLUCOSE, CAPILLARY     Status: Abnormal   Collection Time    07/08/14  9:41  PM      Result Value Ref Range   Glucose-Capillary 174 (*) 70 - 99 mg/dL   Comment 1 Notify RN    CBC WITH DIFFERENTIAL     Status: Abnormal  Collection Time    07/09/14  6:40 AM      Result Value Ref Range   WBC 2.8 (*) 4.0 - 10.5 K/uL   RBC 4.39  4.22 - 5.81 MIL/uL   Hemoglobin 13.0  13.0 - 17.0 g/dL   HCT 18.8  41.6 - 60.6 %   MCV 89.7  78.0 - 100.0 fL   MCH 29.6  26.0 - 34.0 pg   MCHC 33.0  30.0 - 36.0 g/dL   RDW 30.1  60.1 - 09.3 %   Platelets 123 (*) 150 - 400 K/uL   Neutrophils Relative % 36 (*) 43 - 77 %   Lymphocytes Relative 48 (*) 12 - 46 %   Monocytes Relative 12  3 - 12 %   Eosinophils Relative 4  0 - 5 %   Basophils Relative 0  0 - 1 %   Neutro Abs 1.0 (*) 1.7 - 7.7 K/uL   Lymphs Abs 1.4  0.7 - 4.0 K/uL   Monocytes Absolute 0.3  0.1 - 1.0 K/uL   Eosinophils Absolute 0.1  0.0 - 0.7 K/uL   Basophils Absolute 0.0  0.0 - 0.1 K/uL   Smear Review MORPHOLOGY UNREMARKABLE     Comment: Performed at [facility]  GLUCOSE, CAPILLARY     Status: Abnormal   Collection Time    07/09/14  6:40 AM      Result Value Ref Range   Glucose-Capillary 121 (*) 70 - 99 mg/dL  GLUCOSE, CAPILLARY     Status: Abnormal   Collection Time    07/09/14 11:46 AM      Result Value Ref Range   Glucose-Capillary 139 (*) 70 - 99 mg/dL    Physical Findings: AIMS: Facial and Oral Movements Muscles of Facial Expression: None, normal Lips and Perioral Area: None, normal Jaw: None, normal Tongue: None, normal,Extremity Movements Upper (arms, wrists, hands, fingers): None, normal Lower (legs, knees, ankles, toes): None, normal, Trunk Movements Neck, shoulders, hips: None, normal, Overall Severity Severity of abnormal movements (highest score from questions above): None, normal Incapacitation due to abnormal movements: None, normal Patient's awareness of abnormal movements (rate only patient's report): No Awareness, Dental Status Current problems with teeth and/or dentures?:  No Does patient usually wear dentures?: No  CIWA:    COWS:     Treatment Plan and Summary: Daily contact with patient to assess and evaluate symptoms and progress in treatment Medication management  Assessment and Plan:    Will reduce Depakote to 1000 mg po daily today. Patient has had low platelets since the past few weeks ,with worsening on rpt cbc (07/09/14 - 123). Results for DARY, JOHNSEY (MRN [id number]) as of 07/09/2014 15:54  Ref. Range 06/17/2014 01:30 06/17/2014 23:31 06/19/2014 09:52 06/20/2014 03:30 07/09/2014 06:40  Platelets Latest Range: 150-400 K/uL 137 (L) 122 (L) 129 (L) 141 (L) 123 (L)   Will start Lamictal 25 mg po daily. Will increase to Lamictal 50 mg in two days. Will monitor for side effects including skin rash. Will continue Abilify as scheduled. AIMS -0 Will continue trazodone for sleep.Continue Ativan as scheduled for anxiety/agitation.  CSW will work on disposition. Patient has placement issues that needs to be arranged once stabilized.    Medical Decision MakingProblem Points:  Established problem, worsening (2), New problem, with additional work-up planned (4), Review of last therapy session (1) and Review of psycho-social stressors (1) Data Points:  Review of medication regiment & side effects (2)  I certify that inpatient services  furnished can reasonably be expected to improve the patient's condition.   Genavive Kubicki, 07/09/2014, 3:55 PM

## 2014-07-09 NOTE — Progress Notes (Signed)
Pt has been irritable and agitated   He has an unsteady gait and swollen feet  He can be difficult to redirect sometimes and is having problems falling asleep tonight   Pt is on a 1:1 and is presently safe

## 2014-07-09 NOTE — Progress Notes (Signed)
D: Patient resting in bed with eyes closed.  Respirations even and unlabored.  Patient appears to be in no apparent distress. A: Staff to monitor Q 15 mins for safety.   R:Patient remains safe on the unit.  

## 2014-07-09 NOTE — [facility] Group Notes (Signed)
[facility] LCSW Group Therapy  07/09/2014 12:11 PM   Type of Therapy:  Group Therapy  Participation Level:  Did not attend  Summary of Progress/Problems: Today's group focused on relapse prevention.  We defined the term, and then brainstormed on ways to prevent relapse.  [name] 07/09/2014 , 12:11 PM

## 2014-07-10 LAB — GLUCOSE, CAPILLARY
Glucose-Capillary: 115 mg/dL — ABNORMAL HIGH (ref 70–99)
Glucose-Capillary: 114 mg/dL — ABNORMAL HIGH (ref 70–99)
Glucose-Capillary: 162 mg/dL — ABNORMAL HIGH (ref 70–99)
Glucose-Capillary: 169 mg/dL — ABNORMAL HIGH (ref 70–99)
Glucose-Capillary: 137 mg/dL — ABNORMAL HIGH (ref 70–99)
Glucose-Capillary: 174 mg/dL — ABNORMAL HIGH (ref 70–99)

## 2014-07-10 MED ORDER — LAMOTRIGINE 25 MG PO TABS
50.0000 mg | ORAL_TABLET | Freq: Every evening | ORAL | Status: DC
  Administered 2014-07-11: 50 mg via ORAL
  Filled 2014-07-10 (×2): qty 2

## 2014-07-10 MED ORDER — LAMOTRIGINE 25 MG PO TABS
25.0000 mg | ORAL_TABLET | Freq: Once | ORAL | Status: AC
  Administered 2014-07-10: 25 mg via ORAL
  Filled 2014-07-10: qty 1

## 2014-07-10 MED ORDER — TUBERCULIN PPD 5 UNIT/0.1ML ID SOLN
5.0000 [IU] | Freq: Once | INTRADERMAL | Status: AC
  Administered 2014-07-10: 5 [IU] via INTRADERMAL

## 2014-07-10 MED ORDER — DIVALPROEX SODIUM ER 500 MG PO TB24
500.0000 mg | ORAL_TABLET | Freq: Every day | ORAL | Status: DC
  Administered 2014-07-11 – 2014-07-15 (×5): 500 mg via ORAL
  Filled 2014-07-10 (×6): qty 1

## 2014-07-10 NOTE — Progress Notes (Addendum)
Patient ID: Christian [name], male   DOB: 1951-10-12, 63 y.o.   MRN: [id number] 1:1 observation note:  Patient remains high fall risk.  He has been appropriate today.  He has been less irritable, joking with staff and singing songs.  Patient remains very labile at times.  When he goes to the day room, he usually gets agitated over something.  He denies any SI/HI/AVH.

## 2014-07-10 NOTE — [facility] Group Notes (Signed)
[facility] LCSW Aftercare Discharge Planning Group Note   07/10/2014 10:35 AM  Participation Quality:  Engaged  Mood/Affect:  Irritable  Depression Rating:  denies  Anxiety Rating:  denies  Thoughts of Suicide:  No Will you contract for safety?   NA  Current AVH:  No  Plan for Discharge/Comments:  Elio is grumpy today.  He wants to indict me for locking the group room door the other day, territory that we have already covered several times.  He is still blaming me for breaking up his marriage.  After letting him rant for awhile, he sat back, realizing that this was going too far, and stated he would wait till later to talk more.  But of course he could not contain himself, and continued to intervene with each subsequent patient, giving them suggest ions on medications and offering financial assistance.  I told him about a visitor from a group home, and he wanted to know if they had a color TV, and if he could drive.  I asked that he be on his best behavior, and he assured me that he can be when he wants to.  Transportation Means:   Supports:  [name]

## 2014-07-10 NOTE — Progress Notes (Signed)
D   Pt remains a high fall risk and has unsteady gait due to swollen feet   He is confused and irritable    He is in bed presently with eyes closed and no distress noted A   Verbal support given   Medications administered and effectiveness monitored    Q 15 min checks R   Pt safe at present

## 2014-07-10 NOTE — [facility] Group Notes (Signed)
[facility] [employer] Group Therapy  07/10/2014 , 1:22 PM    Type of Therapy:  [facility] Presentation  Participation Level:  Did not attend  Modes of Intervention:  Discussion, Education and Socialization  Summary of Progress/Problems:  Christian [name] from [employer] came to present his recovery story and play the guitar.    [name] 07/10/2014 , 1:22 PM

## 2014-07-10 NOTE — Progress Notes (Signed)
Pt remains a high fall risk due to swollen feat and unsteady gait   His mood is less labile and he isnt shouting as much as yesterday    He is still confused and having some grandiose thoughts   Pt on a 1:1 and is presently safe

## 2014-07-10 NOTE — Progress Notes (Signed)
Patient ID: Christian [name], male   DOB: 28-Mar-1951, 63 y.o.   MRN: [id number] 1:1 observation note:  Patient is compliant with medications.  He has been appropriate with staff today.  He was observed laughing and joking with sitter.  He remains labile at times.

## 2014-07-10 NOTE — Progress Notes (Signed)
Pt remains a high fall risk and is on a 1:1 for safety  He remains safe at present

## 2014-07-10 NOTE — Progress Notes (Signed)
Patient ID: Christian [name], male   DOB: 04-01-1951, 63 y.o.   MRN: [id number] 1:1 notes  07/10/2014 @ 2300  D: Patient awake in wheelchair writting. Pt is pleasant telling jokes. Pt gets agitated but can be easily redirected. Pt c/o bil shoulder pain. Pt rated pain as 8 on a scale of 0-10. No sign of distress noted at this time A: 1:1 observation for safety. Medication administered as prescribed. R: Patient is safe. Sitter at bedside. 1:1 continues

## 2014-07-10 NOTE — Progress Notes (Signed)
[facility] MD Progress Note  07/10/2014 1:49 PM Skylin Roome Cobb  MRN:  [id number]  Subjective:   "I am fine". Patient reports that he has been very calm since yesterday . Reports that his mood is better and that he wants to try to go to a [facility] if necessary.  Objective Patient seen and chart reviewed.  Patient appears to be more calm and cooperative. Denies sleep issues or appetite changes. Denies SI/HI/AH/VH. Reports that he is "[place] ' that his wife will not take him back and that he would work with his CSW to go to a [facility].He continues to have pressured speech as well as delusions of being in a high financial status. Patient has mild tremors of his hands which he previously had (also has multiple medical issues ). His edema (pedal bl ) stays the same (he reports it is better ).Denies any side effects of medications.     Per staff notes - patient was calmer and did not have any acting out behavior.    Diagnosis:   DSM5:  Primary Psychiatric Diagnosis: Bipolar I disorder,current episode Manic  Secondary psychiatric diagnosis: Unspecified neurocognitive disorder  Non Psychiatric diagnosis:  DM. Type II Gout Hypertension OSA on CPAP BL LE edema CKD Hyperlipidemia Diastolic CHF  Total Time spent with patient: 20 minutes    ADL's:  Intact  Sleep: Good  Appetite:  Good  Suicidal Ideation:  Denies Homicidal Ideation:  Denies  Psychiatric Specialty Exam: Physical Exam  Constitutional: He is oriented to person, place, and time. He appears well-developed and well-nourished.  HENT:  Head: Normocephalic and atraumatic.  Neck: Normal range of motion.  Respiratory: Effort normal.  Musculoskeletal: Normal range of motion. He exhibits edema.  Neurological: He is alert and oriented to person, place, and time.  Skin: Skin is warm and dry.    Review of Systems  Constitutional: Negative.   Psychiatric/Behavioral: The patient is nervous/anxious.        Mood lability,anxious,periodic  irritability,delusional    Blood pressure 147/82, pulse 75, temperature 98.7 F (37.1 C), temperature source Oral, resp. rate 20, height 6' (1.829 m), weight 117.482 kg (259 lb), SpO2 99.00%.Body mass index is 35.12 kg/(m^2).  General Appearance: Casual    Eye Contact::  Fair  Speech:  slightly pressured  Volume:  Increased  Mood:  Anxious  Affect:  Labile  Thought Process:  Circumstantial and Tangential  Orientation:  Full (Time, Place, and Person)  Thought Content:  Delusions,grandiose  Suicidal Thoughts:  No  Homicidal Thoughts:  No  Memory:  Immediate;   Fair Recent;   Fair Remote;   Fair  Judgement:  Poor  Insight:  Lacking  Psychomotor Activity:  Increased  Concentration:  Fair  Recall:  Fair  Fund of Knowledge:Good  Language: Good  Akathisia:  No  Handed:  Right  AIMS (if indicated):   0  Assets:  Housing Leisure Time Resilience Social Support  Sleep:  Number of Hours: 5   Musculoskeletal: Strength & Muscle Tone: within normal limits Gait & Station: unsteady Patient leans: N/A  Current Medications: Current Facility-Administered Medications  Medication Dose Route Frequency Provider Last Rate Last Dose  . acetaminophen (TYLENOL) tablet 650 mg  650 mg Oral Q6H PRN [name], NP   650 mg at 07/09/14 2119  . allopurinol (ZYLOPRIM) tablet 300 mg  300 mg Oral Daily [name], NP   300 mg at 07/10/14 0837  . alum & mag hydroxide-simeth (MAALOX/MYLANTA) 200-200-20 MG/5ML suspension 30 mL  30 mL  Oral Q4H PRN [name], NP      . ARIPiprazole (ABILIFY) tablet 15 mg  15 mg Oral BID PC Cleotis Nipper, MD   15 mg at 07/10/14 0837  . aspirin EC tablet 81 mg  81 mg Oral Daily [name], MD   81 mg at 07/10/14 0837  . atorvastatin (LIPITOR) tablet 10 mg  10 mg Oral Daily [name], NP   10 mg at 07/10/14 0837  . colchicine tablet 0.6 mg  0.6 mg Oral BID [name], NP   0.6 mg at 07/10/14 0837  . [START ON 07/11/2014] divalproex (DEPAKOTE ER) 24 hr tablet 500 mg   500 mg Oral Daily Delainy Mcelhiney, MD      . furosemide (LASIX) tablet 80 mg  80 mg Oral BID [name], MD   80 mg at 07/10/14 0837  . guaiFENesin (MUCINEX) 12 hr tablet 600 mg  600 mg Oral BID [name]   600 mg at 07/10/14 0837  . hydrALAZINE (APRESOLINE) tablet 50 mg  50 mg Oral 4 times per day [name], NP   50 mg at 07/10/14 1216  . insulin aspart (novoLOG) injection 0-9 Units  0-9 Units Subcutaneous TID WC [name], MD   2 Units at 07/10/14 1214  . lamoTRIgine (LAMICTAL) tablet 25 mg  25 mg Oral Once Jomarie Longs, MD      . Melene Muller ON 07/11/2014] lamoTRIgine (LAMICTAL) tablet 50 mg  50 mg Oral QPM Babygirl Trager, MD      . LORazepam (ATIVAN) tablet 1 mg  1 mg Oral TID PC [name], PA-C   1 mg at 07/10/14 1216  . magnesium hydroxide (MILK OF MAGNESIA) suspension 30 mL  30 mL Oral Daily PRN [name], NP      . metoprolol succinate (TOPROL-XL) 24 hr tablet 200 mg  200 mg Oral Daily [name], NP   200 mg at 07/10/14 0836  . traZODone (DESYREL) tablet 200 mg  200 mg Oral QHS [name], NP   200 mg at 07/09/14 2118  . tuberculin injection 5 Units  5 Units Intradermal Once Jomarie Longs, MD   5 Units at 07/10/14 1218    Lab Results:  Results for orders placed during the hospital encounter of 06/21/14 (from the past 48 hour(s))  GLUCOSE, CAPILLARY     Status: Abnormal   Collection Time    07/08/14  4:46 PM      Result Value Ref Range   Glucose-Capillary 134 (*) 70 - 99 mg/dL   Comment 1 Documented in Chart     Comment 2 Notify RN    GLUCOSE, CAPILLARY     Status: Abnormal   Collection Time    07/08/14  9:41 PM      Result Value Ref Range   Glucose-Capillary 174 (*) 70 - 99 mg/dL   Comment 1 Notify RN    CBC WITH DIFFERENTIAL     Status: Abnormal   Collection Time    07/09/14  6:40 AM      Result Value Ref Range   WBC 2.8 (*) 4.0 - 10.5 K/uL   RBC 4.39  4.22 - 5.81 MIL/uL   Hemoglobin 13.0  13.0 - 17.0 g/dL   HCT 16.1  09.6 - 04.5 %   MCV 89.7   78.0 - 100.0 fL   MCH 29.6  26.0 - 34.0 pg   MCHC 33.0  30.0 - 36.0 g/dL   RDW 40.9  81.1 - 91.4 %  Platelets 123 (*) 150 - 400 K/uL   Neutrophils Relative % 36 (*) 43 - 77 %   Lymphocytes Relative 48 (*) 12 - 46 %   Monocytes Relative 12  3 - 12 %   Eosinophils Relative 4  0 - 5 %   Basophils Relative 0  0 - 1 %   Neutro Abs 1.0 (*) 1.7 - 7.7 K/uL   Lymphs Abs 1.4  0.7 - 4.0 K/uL   Monocytes Absolute 0.3  0.1 - 1.0 K/uL   Eosinophils Absolute 0.1  0.0 - 0.7 K/uL   Basophils Absolute 0.0  0.0 - 0.1 K/uL   Smear Review MORPHOLOGY UNREMARKABLE     Comment[facility]  GLUCOSE, CAPILLARY     Status: Abnormal   Collection Time    07/09/14  6:40 AM      Result Value Ref Range   Glucose-Capillary 121 (*) 70 - 99 mg/dL  GLUCOSE, CAPILLARY     Status: Abnormal   Collection Time    07/09/14 11:46 AM      Result Value Ref Range   Glucose-Capillary 139 (*) 70 - 99 mg/dL  GLUCOSE, CAPILLARY     Status: Abnormal   Collection Time    07/09/14  9:23 PM      Result Value Ref Range   Glucose-Capillary 151 (*) 70 - 99 mg/dL  GLUCOSE, CAPILLARY     Status: Abnormal   Collection Time    07/10/14  5:00 AM      Result Value Ref Range   Glucose-Capillary 114 (*) 70 - 99 mg/dL    Physical Findings: AIMS: Facial and Oral Movements Muscles of Facial Expression: None, normal Lips and Perioral Area: None, normal Jaw: None, normal Tongue: None, normal,Extremity Movements Upper (arms, wrists, hands, fingers): None, normal Lower (legs, knees, ankles, toes): None, normal, Trunk Movements Neck, shoulders, hips: None, normal, Overall Severity Severity of abnormal movements (highest score from questions above): None, normal Incapacitation due to abnormal movements: None, normal Patient's awareness of abnormal movements (rate only patient's report): No Awareness, Dental Status Current problems with teeth and/or dentures?: No Does patient usually wear dentures?: No   CIWA:    COWS:     Treatment Plan and Summary: Daily contact with patient to assess and evaluate symptoms and progress in treatment Medication management  Assessment and Plan:    Will continue Depakote 1000 mg po daily today and reduce to Depakote 500 mg po tomorrow . Patient has had low platelets since the past few weeks ,with worsening on rpt cbc (07/09/14 - 123). Results for Shelbie ProctorMATIER, Miklo L (MRN [id number]) as of 07/09/2014 15:54  Ref. Range 06/17/2014 01:30 06/17/2014 23:31 06/19/2014 09:52 06/20/2014 03:30 07/09/2014 06:40  Platelets Latest Range: 150-400 K/uL 137 (L) 122 (L) 129 (L) 141 (L) 123 (L)   Will continue Lamictal 25 mg po for tonight. But tomorrow he will receive Lamictal 50 mg. daily. Will monitor for side effects including skin rash. Will continue Abilify as scheduled. AIMS -0 Will continue trazodone for sleep.Continue Ativan as scheduled for anxiety/agitation.  CSW will work on disposition. Patient has placement issues that needs to be arranged once stabilized.    Medical Decision MakingProblem Points:  Established problem, worsening (2), New problem, with additional work-up planned (4), Review of last therapy session (1) and Review of psycho-social stressors (1) Data Points:  Review of medication regiment & side effects (2)  I certify that inpatient services furnished can reasonably be expected to improve  the patient's condition.   Elizabth Palka, 07/10/2014, 1:49 PM

## 2014-07-11 DIAGNOSIS — F09 Unspecified mental disorder due to known physiological condition: Secondary | ICD-10-CM

## 2014-07-11 LAB — GLUCOSE, CAPILLARY
Glucose-Capillary: 110 mg/dL — ABNORMAL HIGH (ref 70–99)
Glucose-Capillary: 148 mg/dL — ABNORMAL HIGH (ref 70–99)
Glucose-Capillary: 109 mg/dL — ABNORMAL HIGH (ref 70–99)
Glucose-Capillary: 173 mg/dL — ABNORMAL HIGH (ref 70–99)

## 2014-07-11 MED ORDER — TRAZODONE HCL 100 MG PO TABS
200.0000 mg | ORAL_TABLET | ORAL | Status: DC
  Administered 2014-07-11 – 2014-07-14 (×4): 200 mg via ORAL
  Filled 2014-07-11 (×3): qty 2
  Filled 2014-07-11: qty 28
  Filled 2014-07-11 (×2): qty 2

## 2014-07-11 NOTE — [facility] Group Notes (Signed)
[facility] Group Notes:  (Counselor/Nursing/MHT/Case Management/Adjunct)  07/11/2014 1:15PM  Type of Therapy:  Group Therapy  Participation Level:  Active  Participation Quality:  Appropriate  Affect:  Flat  Cognitive:  Oriented  Insight:  Improving  Engagement in Group:  Limited  Engagement in Therapy:  Limited  Modes of Intervention:  Discussion, Exploration and Socialization  Summary of Progress/Problems: The topic for group was balance in life.  Pt participated in the discussion about when their life was in balance and out of balance and how this feels.  Pt discussed ways to get back in balance and short term goals they can work on to get where they want to be.   Christian [name] came in late, but was not disruptive upon entrance as he often is.  Sat quietly and raised his hand when he wanted to talk.  A couple of times he wanted to argue with others, but stopped himself and agreed to wait until the end of group.  Even when the subject of community came up, and others were talking about encouraging one particular group member who is often loud and angry, Christian [name] took it in stride and even laughed about it.   [name] 07/11/2014 2:23 PM

## 2014-07-11 NOTE — Progress Notes (Signed)
Patient ID: Christian [name], male   DOB: 01-Jul-1951, 63 y.o.   MRN: [id number] D: Client sitting up in [facility], joking and singing, then cursing from one extreme to another, labile.  Client asking for medications "they told me I could get my medicine early" A: Writer assessed, noted edema in lower extremities, client remarks that he has been losing weight since taking "fluid pill" Writer reviewed medications. Staff will monitor 1:1 for safety. R: Client is safe on the unit.

## 2014-07-11 NOTE — Progress Notes (Signed)
1:1 Note-Pt sitting in wheelchair in hallway, requesting that his bedtime medication be given at 8pm, pt denies SI/HI/AVH at this time, is wanting to meet with the group home and states that he is ready for discharge, pt continues to be irritable and labile at times, took medications and cooperative at this time, MD notified of pt request to change his bedtime meds to 8pm, notified pharmacy of change, sitter at bedside, 1:1 observation continued for safety.

## 2014-07-11 NOTE — Progress Notes (Signed)
Patient ID: Christian [name], male   DOB: [birth year], 63 y.o.   MRN: [id number] [facility] MD Progress Note  07/11/2014 11:18 AM Christian [name]  MRN:  [id number]  Subjective:   Patient states: " I have low frustration tolerance, and I yell and scream to relieve stress and to get on people's nerves.''   Objective Patient seen and chart reviewed.  Patient reports being frustrated that he is not going back to his home upon discharged since his wife refused to take him back. He continues to get easily agitated, lashing out at staffs for no reason, getting belligerent and throws tantrums when thing does not go the way he want. He remains very grandiose, boasting about his financial status and continues to talk about giving or employment opportunity and money to his peers. However, he denies psychosis, suicidal/homicidal ideation, intent or plan.He continues to be hyper verbal and have tangential,  pressured speech. Patient has mild tremors of his hands which he previously had (also has multiple medical issues ). His bilateral pedal edema is unchanged from yesterday. He did not verbalize any side effects to his medications. Our social worker has been working on placing him ina group home since he can not return back to his home.    Diagnosis:   DSM5:  Primary Psychiatric Diagnosis: Bipolar I disorder,current episode Manic  Secondary psychiatric diagnosis: Unspecified neurocognitive disorder  Non Psychiatric diagnosis:  DM. Type II Gout Hypertension OSA on CPAP BL LE edema CKD Hyperlipidemia Diastolic CHF  Total Time spent with patient: 20 minutes    ADL's:  Intact  Sleep: Good  Appetite:  Good  Suicidal Ideation:  Denies Homicidal Ideation:  Denies  Psychiatric Specialty Exam: Physical Exam  Constitutional: He is oriented to person, place, and time. He appears well-developed and well-nourished.  HENT:  Head: Normocephalic and atraumatic.  Neck: Normal range of motion.   Respiratory: Effort normal.  Musculoskeletal: Normal range of motion. He exhibits edema.  Neurological: He is alert and oriented to person, place, and time.  Skin: Skin is warm and dry.  Psychiatric: His affect is angry and labile. His speech is rapid and/or pressured and tangential. He is agitated and aggressive. Thought content is delusional. Cognition and memory are normal. He expresses impulsivity.    Review of Systems  Constitutional: Negative.   Psychiatric/Behavioral: The patient is nervous/anxious.        Mood lability,anxious,periodic irritability,delusional    Blood pressure 122/76, pulse 71, temperature 98.3 F (36.8 C), temperature source Oral, resp. rate 16, height 6' (1.829 m), weight 111.131 kg (245 lb), SpO2 99.00%.Body mass index is 33.22 kg/(m^2).  General Appearance: Casual    Eye Contact::  Fair  Speech:  slightly pressured  Volume:  Increased  Mood:  Anxious  Affect:  Labile  Thought Process:  Circumstantial and Tangential  Orientation:  Full (Time, Place, and Person)  Thought Content:  Delusions,grandiose  Suicidal Thoughts:  No  Homicidal Thoughts:  No  Memory:  Immediate;   Fair Recent;   Fair Remote;   Fair  Judgement:  Poor  Insight:  Lacking  Psychomotor Activity:  Increased  Concentration:  Fair  Recall:  Fair  Fund of Knowledge:Good  Language: Good  Akathisia:  No  Handed:  Right  AIMS (if indicated):   0  Assets:  Housing Leisure Time Resilience Social Support  Sleep:  Number of Hours: 5   Musculoskeletal: Strength & Muscle Tone: within normal limits Gait & Station: unsteady Patient leans: N/A  Current  Medications: Current Facility-Administered Medications  Medication Dose Route Frequency Provider Last Rate Last Dose  . acetaminophen (TYLENOL) tablet 650 mg  650 mg Oral Q6H PRN [name], NP   650 mg at 07/10/14 2109  . allopurinol (ZYLOPRIM) tablet 300 mg  300 mg Oral Daily [name], NP   300 mg at 07/11/14 0814  . alum &  mag hydroxide-simeth (MAALOX/MYLANTA) 200-200-20 MG/5ML suspension 30 mL  30 mL Oral Q4H PRN [name], NP      . ARIPiprazole (ABILIFY) tablet 15 mg  15 mg Oral BID PC Cleotis Nipper, MD   15 mg at 07/11/14 0814  . aspirin EC tablet 81 mg  81 mg Oral Daily [name], MD   81 mg at 07/11/14 0814  . atorvastatin (LIPITOR) tablet 10 mg  10 mg Oral Daily [name], NP   10 mg at 07/11/14 0814  . colchicine tablet 0.6 mg  0.6 mg Oral BID [name], NP   0.6 mg at 07/11/14 1610  . divalproex (DEPAKOTE ER) 24 hr tablet 500 mg  500 mg Oral Daily Saramma Eappen, MD   500 mg at 07/11/14 0814  . furosemide (LASIX) tablet 80 mg  80 mg Oral BID [name], MD   80 mg at 07/11/14 9604  . guaiFENesin (MUCINEX) 12 hr tablet 600 mg  600 mg Oral BID Kallin Henk   600 mg at 07/11/14 0814  . hydrALAZINE (APRESOLINE) tablet 50 mg  50 mg Oral 4 times per day [name], NP   50 mg at 07/11/14 0641  . insulin aspart (novoLOG) injection 0-9 Units  0-9 Units Subcutaneous TID WC [name], MD   1 Units at 07/10/14 1713  . lamoTRIgine (LAMICTAL) tablet 50 mg  50 mg Oral QPM Saramma Eappen, MD      . LORazepam (ATIVAN) tablet 1 mg  1 mg Oral TID PC [name], PA-C   1 mg at 07/11/14 0814  . magnesium hydroxide (MILK OF MAGNESIA) suspension 30 mL  30 mL Oral Daily PRN [name], NP      . metoprolol succinate (TOPROL-XL) 24 hr tablet 200 mg  200 mg Oral Daily [name], NP   200 mg at 07/11/14 0814  . traZODone (DESYREL) tablet 200 mg  200 mg Oral QHS [name], NP   200 mg at 07/10/14 2105  . tuberculin injection 5 Units  5 Units Intradermal Once Jomarie Longs, MD   5 Units at 07/10/14 1218    Lab Results:  Results for orders placed during the hospital encounter of 06/21/14 (from the past 48 hour(s))  GLUCOSE, CAPILLARY     Status: Abnormal   Collection Time    07/09/14 11:46 AM      Result Value Ref Range   Glucose-Capillary 139 (*) 70 - 99 mg/dL  GLUCOSE, CAPILLARY     Status:  Abnormal   Collection Time    07/09/14  5:02 PM      Result Value Ref Range   Glucose-Capillary 115 (*) 70 - 99 mg/dL  GLUCOSE, CAPILLARY     Status: Abnormal   Collection Time    07/09/14  9:23 PM      Result Value Ref Range   Glucose-Capillary 151 (*) 70 - 99 mg/dL  GLUCOSE, CAPILLARY     Status: Abnormal   Collection Time    07/10/14  5:00 AM      Result Value Ref Range   Glucose-Capillary 114 (*) 70 - 99 mg/dL  GLUCOSE, CAPILLARY     Status: Abnormal   Collection Time    07/10/14 11:54 AM      Result Value Ref Range   Glucose-Capillary 169 (*) 70 - 99 mg/dL  GLUCOSE, CAPILLARY     Status: Abnormal   Collection Time    07/10/14 11:57 AM      Result Value Ref Range   Glucose-Capillary 162 (*) 70 - 99 mg/dL  GLUCOSE, CAPILLARY     Status: Abnormal   Collection Time    07/10/14  4:55 PM      Result Value Ref Range   Glucose-Capillary 137 (*) 70 - 99 mg/dL   Comment 1 Notify RN    GLUCOSE, CAPILLARY     Status: Abnormal   Collection Time    07/10/14  8:47 PM      Result Value Ref Range   Glucose-Capillary 174 (*) 70 - 99 mg/dL   Comment 1 Notify RN    GLUCOSE, CAPILLARY     Status: Abnormal   Collection Time    07/11/14  6:15 AM      Result Value Ref Range   Glucose-Capillary 110 (*) 70 - 99 mg/dL    Physical Findings: AIMS: Facial and Oral Movements Muscles of Facial Expression: None, normal Lips and Perioral Area: None, normal Jaw: None, normal Tongue: None, normal,Extremity Movements Upper (arms, wrists, hands, fingers): None, normal Lower (legs, knees, ankles, toes): None, normal, Trunk Movements Neck, shoulders, hips: None, normal, Overall Severity Severity of abnormal movements (highest score from questions above): None, normal Incapacitation due to abnormal movements: None, normal Patient's awareness of abnormal movements (rate only patient's report): No Awareness, Dental Status Current problems with teeth and/or dentures?: No Does patient usually wear  dentures?: No  CIWA:    COWS:     Treatment Plan and Summary: Daily contact with patient to assess and evaluate symptoms and progress in treatment Medication management  Assessment and Plan:    Will continue Depakote 1000 mg po daily today and reduce to Depakote 500 mg po tomorrow . Patient has had low platelets since the past few weeks ,with worsening on rpt cbc (07/09/14 - 123). Results for Christian, [name] (MRN [id number]) as of 07/09/2014 15:54  Ref. Range 06/17/2014 01:30 06/17/2014 23:31 06/19/2014 09:52 06/20/2014 03:30 07/09/2014 06:40  Platelets Latest Range: 150-400 K/uL 137 (L) 122 (L) 129 (L) 141 (L) 123 (L)   Will continue Lamictal 25 mg po for tonight. But tomorrow he will receive Lamictal 50 mg. daily. Will monitor for side effects including skin rash. Will continue Abilify as scheduled. AIMS -0 Will continue trazodone for sleep.Continue Ativan as scheduled for anxiety/agitation.  CSW will work on disposition. Patient has placement issues that needs to be arranged once stabilized.    Medical Decision MakingProblem Points:  Established problem, worsening (2), New problem, with additional work-up planned (4), Review of last therapy session (1) and Review of psycho-social stressors (1) Data Points:  Review of medication regiment & side effects (2)  I certify that inpatient services furnished can reasonably be expected to improve the patient's condition.   Gary Bultman,MD 07/11/2014, 11:18 AM

## 2014-07-11 NOTE — Tx Team (Signed)
  Interdisciplinary Treatment Plan Update   Date Reviewed:  07/11/2014  Time Reviewed:  8:44 AM  Progress in Treatment:   Attending groups: Yes Participating in groups: Yes Taking medication as prescribed: Yes  Tolerating medication: Yes Family/Significant other contact made: Yes  Patient understands diagnosis: Yes  Discussing patient identified problems/goals with staff: Yes Medical problems stabilized or resolved: Yes Denies suicidal/homicidal ideation: Yes Patient has not harmed self or others: Yes  For review of initial/current patient goals, please see plan of care.  Estimated Length of Stay:  4-5 days  Reason for Continuation of Hospitalization: Medication stabilization Other; describe Mood lability, irritability  New Problems/Goals identified:  N/A  Discharge Plan or Barriers:     Additional Comments:  " I have low frustration tolerance, and I yell and scream to relieve stress and to get on people's nerves.''   Patient reports being frustrated that he is not going back to his home upon discharged since his wife refused to take him back. He continues to get easily agitated, lashing out at staff for no reason, getting belligerent and throwing tantrums when things do not go the way he want. He remains very grandiose, boasting about his financial status and continues to talk about giving  employment opportunity and money to his peers. However, he denies psychosis, suicidal/homicidal ideation, intent or plan.He continues to be hyper verbal and have tangential, pressured speech   Attendees:  Signature: Thedore Mins, MD 07/11/2014 8:44 AM   Signature: Richelle Ito, LCSW 07/11/2014 8:44 AM  Signature: [name], NP 07/11/2014 8:44 AM  Signature: Joslyn Devon, RN 07/11/2014 8:44 AM  Signature: Liborio Nixon, RN 07/11/2014 8:44 AM  Signature:  07/11/2014 8:44 AM  Signature:   07/11/2014 8:44 AM  Signature:    Signature:    Signature:    Signature:    Signature:    Signature:       Scribe for Treatment Team:   Richelle Ito, LCSW  07/11/2014 8:44 AM

## 2014-07-12 LAB — GLUCOSE, CAPILLARY
Glucose-Capillary: 104 mg/dL — ABNORMAL HIGH (ref 70–99)
Glucose-Capillary: 151 mg/dL — ABNORMAL HIGH (ref 70–99)
Glucose-Capillary: 118 mg/dL — ABNORMAL HIGH (ref 70–99)
Glucose-Capillary: 145 mg/dL — ABNORMAL HIGH (ref 70–99)

## 2014-07-12 MED ORDER — LAMOTRIGINE 100 MG PO TABS
100.0000 mg | ORAL_TABLET | Freq: Every evening | ORAL | Status: DC
  Administered 2014-07-12 – 2014-07-15 (×4): 100 mg via ORAL
  Filled 2014-07-12: qty 1
  Filled 2014-07-12: qty 14
  Filled 2014-07-12 (×2): qty 1
  Filled 2014-07-12: qty 14
  Filled 2014-07-12: qty 1
  Filled 2014-07-12: qty 14

## 2014-07-12 NOTE — Progress Notes (Signed)
Patient ID: Christian [name], male   DOB: [birth year], 63 y.o.   MRN: [id number] Ppd read at 1000 on 07-12-14 and 0 induration.

## 2014-07-12 NOTE — Progress Notes (Signed)
[facility]  07/12/2014 6:37 PM Christian PeekCharles L [name]  MRN:  [id number]  Subjective:   "I am OK ". Patient reports that he does not like any of the staff here and is trying not curse anyone. Reports he had no good reason to come here and that his wife put him in here.  Objective Patient seen and chart reviewed.  Patient appears to be agitated ,angry ,reports he is trying not to be loud and mean. Continues to be disorganized with labile moods, periods of agitation. He is tangential with flight of ideas and continues to be delusional. Patient is in wheel chair ,has multiple medical problems and is unable to walk without assistance. Patient continues to have BL pedal edema -3+. Patient reports sleep as improved. Denies any SI at this time. Denies AH/VH/HI.   Per staff - he continues to have periods of agitation . Patient upset since his wife would not take him back.    Diagnosis:   DSM5:  Primary Psychiatric Diagnosis: Bipolar I disorder,current episode Manic  Secondary psychiatric diagnosis: Unspecified neurocognitive disorder  Non Psychiatric diagnosis:  DM. Type II Gout Hypertension OSA on CPAP BL LE edema CKD Hyperlipidemia Diastolic CHF  Total Time spent with patient: 20 minutes    ADL's:  Intact  Sleep: Good  Appetite:  Good  Suicidal Ideation:  Denies Homicidal Ideation:  Denies  Psychiatric Specialty Exam: Physical Exam  Constitutional: He is oriented to person, place, and time. He appears well-developed and well-nourished.  HENT:  Head: Normocephalic and atraumatic.  Neck: Normal range of motion.  Respiratory: Effort normal.  Musculoskeletal: Normal range of motion. He exhibits edema.  Neurological: He is alert and oriented to person, place, and time.  Skin: Skin is warm and dry.    Review of Systems  Constitutional: Negative.   Psychiatric/Behavioral: The patient is nervous/anxious.        Mood lability,anxious,periodic irritability,delusional    Blood pressure 148/83, pulse 78, temperature 98.3 F (36.8 C), temperature source Oral, resp. rate 16, height 6' (1.829 m), weight 116.574 kg (257 lb), SpO2 99.00%.Body mass index is 34.85 kg/(m^2).  General Appearance: Casual    Eye Contact::  Fair  Speech:  slightly pressured  Volume:  Increased  Mood:  Anxious  Affect:  Labile  Thought Process:  Circumstantial and Tangential  Orientation:  Full (Time, Place, and Person)  Thought Content:  Delusions,grandiose  Suicidal Thoughts:  No  Homicidal Thoughts:  No  Memory:  Immediate;   Fair Recent;   Fair Remote;   Fair  Judgement:  Poor  Insight:  Lacking  Psychomotor Activity:  Increased  Concentration:  Fair  Recall:  Fair  Fund of Knowledge:Good  Language: Good  Akathisia:  No  Handed:  Right  AIMS (if indicated):   0  Assets:  Housing Leisure Time Resilience Social Support  Sleep:  Number of Hours: 4.75   Musculoskeletal: Strength & Muscle Tone: within normal limits Gait & Station: unsteady Patient leans: N/A  Current Medications: Current Facility-Administered Medications  Medication Dose Route Frequency Provider Last Rate Last Dose  . acetaminophen (TYLENOL) tablet 650 mg  650 mg Oral Q6H PRN Fransisca KaufmannLaura Davis, NP   650 mg at 07/10/14 2109  . allopurinol (ZYLOPRIM) tablet 300 mg  300 mg Oral Daily Fransisca KaufmannLaura Davis, NP   300 mg at 07/12/14 0804  . alum & mag hydroxide-simeth (MAALOX/MYLANTA) 200-200-20 MG/5ML suspension 30 mL  30 mL Oral Q4H PRN Fransisca KaufmannLaura Davis, NP      .  ARIPiprazole (ABILIFY) tablet 15 mg  15 mg Oral BID PC Cleotis Nipper, MD   15 mg at 07/12/14 1643  . aspirin EC tablet 81 mg  81 mg Oral Daily [name], MD   81 mg at 07/12/14 0804  . atorvastatin (LIPITOR) tablet 10 mg  10 mg Oral Daily [name], NP   10 mg at 07/12/14 0803  . colchicine tablet 0.6 mg  0.6 mg Oral BID [name], NP   0.6 mg at 07/12/14 1643  . divalproex (DEPAKOTE ER) 24 hr tablet 500 mg  500 mg Oral Daily Jomarie Longs, MD   500  mg at 07/12/14 0804  . furosemide (LASIX) tablet 80 mg  80 mg Oral BID [name], MD   80 mg at 07/12/14 1643  . guaiFENesin (MUCINEX) 12 hr tablet 600 mg  600 mg Oral BID [name]   600 mg at 07/12/14 0804  . hydrALAZINE (APRESOLINE) tablet 50 mg  50 mg Oral 4 times per day [name], NP   50 mg at 07/12/14 1643  . insulin aspart (novoLOG) injection 0-9 Units  0-9 Units Subcutaneous TID WC [name], MD   2 Units at 07/12/14 1154  . lamoTRIgine (LAMICTAL) tablet 100 mg  100 mg Oral QPM Kveon Casanas, MD   100 mg at 07/12/14 1644  . LORazepam (ATIVAN) tablet 1 mg  1 mg Oral TID PC [name], PA-C   1 mg at 07/12/14 1645  . magnesium hydroxide (MILK OF MAGNESIA) suspension 30 mL  30 mL Oral Daily PRN [name], NP      . metoprolol succinate (TOPROL-XL) 24 hr tablet 200 mg  200 mg Oral Daily [name], NP   200 mg at 07/12/14 0804  . traZODone (DESYREL) tablet 200 mg  200 mg Oral Q24H [name]   200 mg at 07/11/14 2033    Lab Results:  Results for orders placed during the hospital encounter of 06/21/14 (from the past 48 hour(s))  GLUCOSE, CAPILLARY     Status: Abnormal   Collection Time    07/10/14  8:47 PM      Result Value Ref Range   Glucose-Capillary 174 (*) 70 - 99 mg/dL   Comment 1 Notify RN    GLUCOSE, CAPILLARY     Status: Abnormal   Collection Time    07/11/14  6:15 AM      Result Value Ref Range   Glucose-Capillary 110 (*) 70 - 99 mg/dL  GLUCOSE, CAPILLARY     Status: Abnormal   Collection Time    07/11/14  1:00 PM      Result Value Ref Range   Glucose-Capillary 148 (*) 70 - 99 mg/dL   Comment 1 Documented in Chart     Comment 2 Notify RN    GLUCOSE, CAPILLARY     Status: Abnormal   Collection Time    07/11/14  5:10 PM      Result Value Ref Range   Glucose-Capillary 109 (*) 70 - 99 mg/dL   Comment 1 Documented in Chart     Comment 2 Notify RN    GLUCOSE, CAPILLARY     Status: Abnormal   Collection Time    07/11/14  8:28 PM       Result Value Ref Range   Glucose-Capillary 173 (*) 70 - 99 mg/dL  GLUCOSE, CAPILLARY     Status: Abnormal   Collection Time    07/12/14  6:32 AM  Result Value Ref Range   Glucose-Capillary 104 (*) 70 - 99 mg/dL   Comment 1 Notify RN     Comment 2 Documented in Chart    GLUCOSE, CAPILLARY     Status: Abnormal   Collection Time    07/12/14 11:23 AM      Result Value Ref Range   Glucose-Capillary 151 (*) 70 - 99 mg/dL   Comment 1 Documented in Chart     Comment 2 Notify RN    GLUCOSE, CAPILLARY     Status: Abnormal   Collection Time    07/12/14  4:54 PM      Result Value Ref Range   Glucose-Capillary 118 (*) 70 - 99 mg/dL   Comment 1 Notify RN      Physical Findings: AIMS: Facial and Oral Movements Muscles of Facial Expression: None, normal Lips and Perioral Area: None, normal Jaw: None, normal Tongue: None, normal,Extremity Movements Upper (arms, wrists, hands, fingers): None, normal Lower (legs, knees, ankles, toes): None, normal, Trunk Movements Neck, shoulders, hips: None, normal, Overall Severity Severity of abnormal movements (highest score from questions above): None, normal Incapacitation due to abnormal movements: None, normal Patient's awareness of abnormal movements (rate only patient's report): No Awareness, Dental Status Current problems with teeth and/or dentures?: No Does patient usually wear dentures?: No  CIWA:    COWS:     Treatment Plan and Summary: Daily contact with patient to assess and evaluate symptoms and progress in treatment Medication management  Assessment and Plan:    Will continue Depakote 500 mg po daily today and could DC in two days. Patient has had low platelets since the past few weeks ,with worsening on rpt cbc (07/09/14 - 123). Results for ISAACK, PREBLE (MRN [id number]) as of 07/09/2014 15:54  Ref. Range 06/17/2014 01:30 06/17/2014 23:31 06/19/2014 09:52 06/20/2014 03:30 07/09/2014 06:40  Platelets Latest Range: 150-400 K/uL 137 (L)  122 (L) 129 (L) 141 (L) 123 (L)   Will increase Lamictal to 100 mg po . Will monitor for side effects including skin rash. Will continue Abilify as scheduled. AIMS -0 Will continue trazodone for sleep.Continue Ativan as scheduled for anxiety/agitation.  CSW will work on disposition. Patient has placement issues that needs to be arranged once stabilized.Patient expected to have an interview with [facility] today. Patient has multiple medical issues as well as psychiatric issues and is not safe to be discharged in to an unsupervised environment.    Medical Decision MakingProblem Points:  Established problem, worsening (2), New problem, with additional work-up planned (4), Review of last therapy session (1) and Review of psycho-social stressors (1) Data Points:  Review of medication regiment & side effects (2)  I certify that inpatient services furnished can reasonably be expected to improve the patient's condition.   Kelsey Edman, 07/12/2014, 6:37 PM

## 2014-07-12 NOTE — [facility] Group Notes (Signed)
[facility] LCSW Group Therapy  07/12/2014  1:05 PM  Type of Therapy:  Group therapy  Participation Level:  Active  Participation Quality:  Attentive  Affect:  Flat  Cognitive:  Oriented  Insight:  Limited  Engagement in Therapy:  Limited  Modes of Intervention:  Discussion, Socialization  Summary of Progress/Problems:  Chaplain was here to lead a group on themes of hope and courage.  Intrusive.  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  [name] 07/12/2014 1:39 PM

## 2014-07-12 NOTE — Progress Notes (Signed)
Patient ID: Christian [name], male   DOB: 10/30/1951, 62 y.o.   MRN: [id number] D. 1-1 monitoring note. Patient remains high fall risk and continues to have labile, impulsive behavior. Christian [name] continues to require frequent redirection. A. Staff remain at bedside for safety. R. Patient is safe. Will continue to monitor as ordered.   

## 2014-07-12 NOTE — [facility] Group Notes (Signed)
[facility] LCSW Aftercare Discharge Planning Group Note   07/12/2014 10:35 AM  Participation Quality:  Minimal  Mood/Affect:  Irritable  Depression Rating:  denies  Anxiety Rating:  denies  Thoughts of Suicide:  No Will you contract for safety?   NA  Current AVH:  No  Plan for Discharge/Comments:  Zuhaib initially left when I came in the dayroom to start group because I did not tell him he was leaving today.  He eventually returned, but was invested only in telling other patients how he could help them out financially.  Got mad when I told him he needed to stop during group.  Transportation Means:   Supports:  [name]

## 2014-07-12 NOTE — Progress Notes (Signed)
Psychoeducational Group Note  Date:  07/12/2014 Time:  2000  Group Topic/Focus:  Wrap-Up Group:   The focus of this group is to help patients review their daily goal of treatment and discuss progress on daily workbooks.  Participation Level: Did Not Attend  Participation Quality:  Not Applicable  Affect:  Not Applicable  Cognitive:  Not Applicable  Insight:  Not Applicable  Engagement in Group: Not Applicable  Additional Comments:  The patient did not attend group this evening.   Hazle Coca S 07/12/2014, 10:42 PM

## 2014-07-12 NOTE — Progress Notes (Addendum)
Patient ID: Christian [name], male   DOB: 11-Jan-1951, 63 y.o.   MRN: [id number] D. 1-1 monitoring note. Patient remains high fall risk and continues to have labile, impulsive behavior. His gait is improving, and sherwin is noted to walk using the assistance of wheelchair. 1.1. Staff is at arms length. Koury continues to require frequent redirection, but is able to be verbally redirected. A. Staff remain at bedside for safety. R. Patient is safe. Will continue to monitor as ordered.

## 2014-07-12 NOTE — Progress Notes (Signed)
Pt did not attend group this evening.  

## 2014-07-12 NOTE — Progress Notes (Signed)
Patient ID: Christian [name], male   DOB: 11/07/1951, 62 y.o.   MRN: [id number] D. 1-1 monitoring note. Patient remains high fall risk and continues to have labile, impulsive behavior. Mariana continues to require frequent redirection. A. Staff remain at bedside for safety. R. Patient is safe. Will continue to monitor as ordered.   

## 2014-07-13 LAB — GLUCOSE, CAPILLARY
Glucose-Capillary: 124 mg/dL — ABNORMAL HIGH (ref 70–99)
Glucose-Capillary: 237 mg/dL — ABNORMAL HIGH (ref 70–99)
Glucose-Capillary: 173 mg/dL — ABNORMAL HIGH (ref 70–99)
Glucose-Capillary: 142 mg/dL — ABNORMAL HIGH (ref 70–99)

## 2014-07-13 NOTE — Progress Notes (Signed)
Patient ID: Christian [name], male   DOB: 11-Jun-1951, 63 y.o.   MRN: [id number] Psychoeducational Group Note  Date:  07/13/2014 Time:  0900  Group Topic/Focus:  inventory group   Participation Level: Did Not Attend  Participation Quality:  Not Applicable  Affect:  Not Applicable  Cognitive:  Not Applicable  Insight:  Not Applicable  Engagement in Group: Not Applicable  Additional Comments:  Inventory group did not attend.   Valente David 07/13/2014, 10:11 AM

## 2014-07-13 NOTE — Progress Notes (Signed)
Patient ID: Christian [name], male   DOB: 10-30-1951, 63 y.o.   MRN: [id number] D. 1-1 monitoring note. Patient remains high fall risk and continues to have labile, impulsive behavior. Christian [name] states '' I'm going to tear this place down if you don't let me out of here tomorrow. '' Christian [name] continues to require frequent redirection. A. Staff remain at bedside for safety. R. Patient is safe. Will continue to monitor as ordered.

## 2014-07-13 NOTE — Progress Notes (Signed)
Patient ID: Christian [name], male   DOB: 05/09/1951, 62 y.o.   MRN: [id number] D. 1-1 monitoring note. Patient remains high fall risk and continues to have labile, impulsive behavior. Christian [name] continues to require frequent redirection. A. Staff remain at bedside for safety. R. Patient is safe. Will continue to monitor as ordered.   

## 2014-07-13 NOTE — Progress Notes (Signed)
Patient ID: Christian [name], male   DOB: Nov 26, 1951, 63 y.o.   MRN: [id number] [facility] MD Progress Note  07/13/2014 12:46 PM Christian [name]  MRN:  [id number]  Subjective:  Christian [name] reports, "There is nothing wrong with me. My wife conspired to put me here because she was scared because I yelled at her. I'm not going to lie, I was a little manic at the time, but, I was functioning. I don't kneed to be here here. I need to go home, when can I be discharged?  Objective Patient seen and chart reviewed.  Patient appears to be agitated ,angry and confrontational. He says,  he is trying to not be loud, mean and or curse at anyone. He is disorganized with labile moods, periods of agitation and anger. He is tangential with flight of ideas. Patient is in a wheel chair, has multiple health problems and is unable to walk without assistance. Patient continues to have bilateral lower extremity pedal edema 3+ with discolorations. Patient says he is doing okay. Denies any SI at this time. Denies AH/VH/HI. He remains on 1:1 supervision.  Diagnosis:   DSM5:  Primary Psychiatric Diagnosis: Bipolar I disorder,current episode Manic  Secondary psychiatric diagnosis: Unspecified neurocognitive disorder  Non Psychiatric diagnosis:  DM. Type II Gout Hypertension OSA on CPAP BL LE edema CKD Hyperlipidemia Diastolic CHF  Total Time spent with patient: 20 minutes    ADL's:  Intact  Sleep: Good  Appetite:  Good  Suicidal Ideation:  Denies Homicidal Ideation:  Denies  Psychiatric Specialty Exam: Physical Exam  Constitutional: He is oriented to person, place, and time. He appears well-developed and well-nourished.  HENT:  Head: Normocephalic and atraumatic.  Neck: Normal range of motion.  Respiratory: Effort normal.  Musculoskeletal: Normal range of motion. He exhibits edema.  Neurological: He is alert and oriented to person, place, and time.  Skin: Skin is warm and dry.  Psychiatric: Thought  content normal. His affect is angry, blunt, labile and inappropriate. His speech is rapid and/or pressured and tangential. He is agitated and aggressive. Cognition and memory are normal. He expresses inappropriate judgment.    Review of Systems  Constitutional: Negative.   Psychiatric/Behavioral: The patient is nervous/anxious.        Mood lability,anxious,periodic irritability,delusional    Blood pressure 134/72, pulse 80, temperature 98.3 F (36.8 C), temperature source Oral, resp. rate 20, height 6' (1.829 m), weight 116.574 kg (257 lb), SpO2 99.00%.Body mass index is 34.85 kg/(m^2).  General Appearance: Casual   Eye Contact::  Fair  Speech:  slightly pressured  Volume:  Increased  Mood:  Anxious  Affect:  Labile  Thought Process:  Circumstantial and Tangential  Orientation:  Full (Time, Place, and Person)  Thought Content:  Delusions,grandiose  Suicidal Thoughts:  No  Homicidal Thoughts:  No  Memory:  Immediate;   Fair Recent;   Fair Remote;   Fair  Judgement:  Poor  Insight:  Lacking  Psychomotor Activity:  Increased  Concentration:  Fair  Recall:  Fair  Fund of Knowledge:Good  Language: Good  Akathisia:  No  Handed:  Right  AIMS (if indicated):   0  Assets:  Housing Leisure Time Resilience Social Support  Sleep:  Number of Hours: 3.25   Musculoskeletal: Strength & Muscle Tone: within normal limits Gait & Station: unsteady Patient leans: N/A  Current Medications: Current Facility-Administered Medications  Medication Dose Route Frequency Provider Last Rate Last Dose  . acetaminophen (TYLENOL) tablet 650 mg  650 mg Oral Q6H  PRN Fransisca KaufmannLaura Davis, NP   650 mg at 07/10/14 2109  . allopurinol (ZYLOPRIM) tablet 300 mg  300 mg Oral Daily Fransisca KaufmannLaura Davis, NP   300 mg at 07/13/14 0816  . alum & mag hydroxide-simeth (MAALOX/MYLANTA) 200-200-20 MG/5ML suspension 30 mL  30 mL Oral Q4H PRN Fransisca KaufmannLaura Davis, NP      . ARIPiprazole (ABILIFY) tablet 15 mg  15 mg Oral BID PC Cleotis NipperSyed T Arfeen, MD    15 mg at 07/13/14 0816  . aspirin EC tablet 81 mg  81 mg Oral Daily Renae FickleMackenzie Short, MD   81 mg at 07/13/14 0816  . atorvastatin (LIPITOR) tablet 10 mg  10 mg Oral Daily Fransisca KaufmannLaura Davis, NP   10 mg at 07/13/14 0816  . colchicine tablet 0.6 mg  0.6 mg Oral BID Fransisca KaufmannLaura Davis, NP   0.6 mg at 07/13/14 0816  . divalproex (DEPAKOTE ER) 24 hr tablet 500 mg  500 mg Oral Daily Saramma Eappen, MD   500 mg at 07/13/14 0816  . furosemide (LASIX) tablet 80 mg  80 mg Oral BID Renae FickleMackenzie Short, MD   80 mg at 07/13/14 0816  . guaiFENesin (MUCINEX) 12 hr tablet 600 mg  600 mg Oral BID [name]   600 mg at 07/13/14 0816  . hydrALAZINE (APRESOLINE) tablet 50 mg  50 mg Oral 4 times per day Fransisca KaufmannLaura Davis, NP   50 mg at 07/13/14 1146  . insulin aspart (novoLOG) injection 0-9 Units  0-9 Units Subcutaneous TID WC Rodolph Bonganiel Thompson V, MD   3 Units at 07/13/14 1145  . lamoTRIgine (LAMICTAL) tablet 100 mg  100 mg Oral QPM Saramma Eappen, MD   100 mg at 07/12/14 1644  . LORazepam (ATIVAN) tablet 1 mg  1 mg Oral TID PC Verne SpurrNeil Mashburn, PA-C   1 mg at 07/13/14 1146  . magnesium hydroxide (MILK OF MAGNESIA) suspension 30 mL  30 mL Oral Daily PRN Fransisca KaufmannLaura Davis, NP      . metoprolol succinate (TOPROL-XL) 24 hr tablet 200 mg  200 mg Oral Daily Fransisca KaufmannLaura Davis, NP   200 mg at 07/13/14 0816  . traZODone (DESYREL) tablet 200 mg  200 mg Oral Q24H [name]   200 mg at 07/12/14 2030    Lab Results:  Results for orders placed during the hospital encounter of 06/21/14 (from the past 48 hour(s))  GLUCOSE, CAPILLARY     Status: Abnormal   Collection Time    07/11/14  1:00 PM      Result Value Ref Range   Glucose-Capillary 148 (*) 70 - 99 mg/dL   Comment 1 Documented in Chart     Comment 2 Notify RN    GLUCOSE, CAPILLARY     Status: Abnormal   Collection Time    07/11/14  5:10 PM      Result Value Ref Range   Glucose-Capillary 109 (*) 70 - 99 mg/dL   Comment 1 Documented in Chart     Comment 2 Notify RN    GLUCOSE, CAPILLARY      Status: Abnormal   Collection Time    07/11/14  8:28 PM      Result Value Ref Range   Glucose-Capillary 173 (*) 70 - 99 mg/dL  GLUCOSE, CAPILLARY     Status: Abnormal   Collection Time    07/12/14  6:32 AM      Result Value Ref Range   Glucose-Capillary 104 (*) 70 - 99 mg/dL   Comment 1 Notify RN  Comment 2 Documented in Chart    GLUCOSE, CAPILLARY     Status: Abnormal   Collection Time    07/12/14 11:23 AM      Result Value Ref Range   Glucose-Capillary 151 (*) 70 - 99 mg/dL   Comment 1 Documented in Chart     Comment 2 Notify RN    GLUCOSE, CAPILLARY     Status: Abnormal   Collection Time    07/12/14  4:54 PM      Result Value Ref Range   Glucose-Capillary 118 (*) 70 - 99 mg/dL   Comment 1 Notify RN    GLUCOSE, CAPILLARY     Status: Abnormal   Collection Time    07/12/14  8:41 PM      Result Value Ref Range   Glucose-Capillary 145 (*) 70 - 99 mg/dL  GLUCOSE, CAPILLARY     Status: Abnormal   Collection Time    07/13/14  6:02 AM      Result Value Ref Range   Glucose-Capillary 124 (*) 70 - 99 mg/dL  GLUCOSE, CAPILLARY     Status: Abnormal   Collection Time    07/13/14 11:32 AM      Result Value Ref Range   Glucose-Capillary 237 (*) 70 - 99 mg/dL   Comment 1 Notify RN      Physical Findings: AIMS: Facial and Oral Movements Muscles of Facial Expression: None, normal Lips and Perioral Area: None, normal Jaw: None, normal Tongue: None, normal,Extremity Movements Upper (arms, wrists, hands, fingers): None, normal Lower (legs, knees, ankles, toes): None, normal, Trunk Movements Neck, shoulders, hips: None, normal, Overall Severity Severity of abnormal movements (highest score from questions above): None, normal Incapacitation due to abnormal movements: None, normal Patient's awareness of abnormal movements (rate only patient's report): No Awareness, Dental Status Current problems with teeth and/or dentures?: No Does patient usually wear dentures?: No  CIWA:     COWS:     Treatment Plan and Summary: Daily contact with patient to assess and evaluate symptoms and progress in treatment Medication management  Assessment and Plan:  1. Continue crisis management & mood stabilization 2. Continue current medication management to reduce current symptoms to base line and improve the  patient's overall level of functioning; continue Abilify 15 mg for mood control, Depakote 500 mg for mood stabilization, Lamictal 100 mg for mood stabilization, Lorazepam 1 mg for agitaion & Trazodone 200 mg for insomnia, obtain Depakote levels. 3. Treat health problems as indicated; continue all the pertinent home medications for the other medical issues. 4. Develop treatment plan to maintain mood stability upon discharge and the need for  readmission. 5. Psycho-social education regarding self care.  CSW will work on disposition. Patient has placement issues that needs to be arranged once stabilized.Patient expected to have an interview with [facility] today. Patient has multiple medical issues as well as psychiatric issues and is not safe to be discharged in to an unsupervised environment.  Medical Decision MakingProblem Points:  Established problem, worsening (2), New problem, with additional work-up planned (4), Review of last therapy session (1) and Review of psycho-social stressors (1) Data Points:  Review of medication regiment & side effects (2)  I certify that inpatient services furnished can reasonably be expected to improve the patient's condition.   Sanjuana Kava, PMHNP-BC 07/13/2014, 12:46 PM  Case discussed, agree with assessment and plan Madie Reno A. Dub Mikes, M.D.

## 2014-07-13 NOTE — Progress Notes (Signed)
Patient ID: Christian [name], male   DOB: 12-22-1950, 63 y.o.   MRN: [id number] D. 1-1 monitoring note. Patient remains high fall risk and continues to have labile, impulsive behavior. Christian [name] continues to be verbally aggressive to staff at times, yelling ''I'm going to cuss her out ! No i'm going to try to be good. But I want to cuss her out ! ''  His gait is improving, and Christian [name] is noted to walk using the assistance of wheelchair. 1.1. Staff is at arms length. Christian [name] continues to require frequent redirection, but is able to be verbally redirected. A. Staff remain at bedside for safety. R. Patient is safe. Will continue to monitor as ordered.

## 2014-07-13 NOTE — [facility] Group Notes (Signed)
[facility] Group Notes: (Clinical Social Work)   07/13/2014      Type of Therapy:  Group Therapy   Participation Level:  Did Not Attend    Rhanda Lemire Grossman-Orr, LCSW 07/13/2014, 12:45 PM     

## 2014-07-13 NOTE — Progress Notes (Signed)
Patient ID: Christian [name], male   DOB: 1951-05-16, 63 y.o.   MRN: [id number] D: Client pleasant at times, singing, then loud at times reports "I'm going home tomorrow, they better let me go home, I don't need my CPAP, cause I'm going home" Daughter visiting tonight. A: Writer encouraged client to use CPAP, reviewed medications. Staff will monitor 1:1 for safety. R: Client is safe on the unit, consents to using CPAP.

## 2014-07-13 NOTE — Progress Notes (Signed)
The focus of this group is to help patients review their daily goal of treatment and discuss progress on daily workbooks. Pt did not attend the evening group. 

## 2014-07-13 NOTE — Progress Notes (Signed)
Patient ID: Christian [name], male   DOB: [birth year], 63 y.o.   MRN: [id number] Psychoeducational Group Note  Date:  07/13/2014 Time:  0930  Group Topic/Focus:  healthy coping skills.   Participation Level: Did Not Attend  Participation Quality:  Not Applicable  Affect:  Not Applicable  Cognitive:  Not Applicable  Insight:  Not Applicable  Engagement in Group: Not Applicable  Additional Comments:  Did not attend.   Valente David 07/13/2014, 10:11 AM

## 2014-07-13 NOTE — Progress Notes (Signed)
D: 1:1 MONITORING NOTE: Patient remain on 1:1 for safety - high fall risk. Patient occasional loud but responds to verbal redirection. Pt continues to exhibit impulsive behavior.  A: Staff remains at bedside for safety. R: patient is safe. Will continue to monitor patient.

## 2014-07-13 NOTE — Progress Notes (Signed)
D: 1:1 MONITORING FOR SAFETY: Patient complied with 1:1 for safety. Remains labile and argumentative. Verbally redirectable most of the time. Pt complied with medications and treatment this mane. Safety maintained. Will continue to monitor patient.

## 2014-07-14 LAB — GLUCOSE, CAPILLARY
Glucose-Capillary: 109 mg/dL — ABNORMAL HIGH (ref 70–99)
Glucose-Capillary: 275 mg/dL — ABNORMAL HIGH (ref 70–99)
Glucose-Capillary: 108 mg/dL — ABNORMAL HIGH (ref 70–99)
Glucose-Capillary: 139 mg/dL — ABNORMAL HIGH (ref 70–99)

## 2014-07-14 MED ORDER — LORAZEPAM 1 MG PO TABS
ORAL_TABLET | ORAL | Status: AC
  Filled 2014-07-14: qty 1

## 2014-07-14 MED ORDER — HALOPERIDOL LACTATE 5 MG/ML IJ SOLN
5.0000 mg | Freq: Once | INTRAMUSCULAR | Status: AC
  Administered 2014-07-14: 5 mg via INTRAMUSCULAR

## 2014-07-14 MED ORDER — LORAZEPAM 2 MG/ML IJ SOLN
2.0000 mg | Freq: Once | INTRAMUSCULAR | Status: AC
  Administered 2014-07-14: 2 mg via INTRAMUSCULAR

## 2014-07-14 MED ORDER — LORAZEPAM 1 MG PO TABS: 2.0000 mg | ORAL_TABLET | Freq: Once | ORAL | Status: DC

## 2014-07-14 MED ORDER — HALOPERIDOL LACTATE 5 MG/ML IJ SOLN
INTRAMUSCULAR | Status: AC
  Administered 2014-07-14: 5 mg via INTRAMUSCULAR
  Filled 2014-07-14: qty 1

## 2014-07-14 MED ORDER — LORAZEPAM 2 MG/ML IJ SOLN
INTRAMUSCULAR | Status: AC
  Administered 2014-07-14: 2 mg via INTRAMUSCULAR
  Filled 2014-07-14: qty 1

## 2014-07-14 MED ORDER — BENZTROPINE MESYLATE 1 MG PO TABS
1.0000 mg | ORAL_TABLET | Freq: Two times a day (BID) | ORAL | Status: DC
  Administered 2014-07-14 – 2014-07-16 (×4): 1 mg via ORAL
  Filled 2014-07-14: qty 1
  Filled 2014-07-14: qty 28
  Filled 2014-07-14: qty 1
  Filled 2014-07-14: qty 28
  Filled 2014-07-14: qty 1
  Filled 2014-07-14: qty 28
  Filled 2014-07-14: qty 1
  Filled 2014-07-14 (×3): qty 28
  Filled 2014-07-14: qty 1
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
## 2014-07-14 NOTE — Progress Notes (Signed)
Patient ID: Christian [name], male   DOB: [birth year], 63 y.o.   MRN: [id number] D. 1-1 monitoring note. Patient remains high fall risk and continues to have labile, impulsive behavior. [name] continues to require frequent redirection. A. Staff remain at bedside for safety. R. Patient is safe. Will continue to monitor as ordered.
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
## 2014-07-14 NOTE — Progress Notes (Signed)
Patient ID: Christian [name], male   DOB: 01/07/1951, 63 y.o.   MRN: [id number] D. 1-1 monitoring note. Patient remains high fall risk and continues to have labile, impulsive behavior. Nicolaus continues to require frequent redirection.  He is currently eating dinner and in no acute distress. A. Staff remain at bedside for safety. R. Patient is safe. Will continue to monitor as ordered .
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
## 2014-07-14 NOTE — [facility] Group Notes (Signed)
[facility] Group Notes:  (Nursing/MHT/Case Management/Adjunct)  Date:  07/14/2014  Time:  11:39 AM  Type of Therapy:  Psychoeducational Skills  Participation Level:  Active  Participation Quality:  Intrusive, Monopolizing and Redirectable  Affect:  Angry, Defensive, Irritable and Labile  Cognitive:  Lacking  Insight:  Lacking and Limited  Engagement in Group:  Lacking, Limited, Monopolizing and Off Topic  Modes of Intervention:  Activity, Discussion and Limit-setting  Summary of Progress/Problems:Pt was intrusive, disruptive and loud throughout the group.  He had to be redirected several times.   Jule Ser 07/14/2014, 11:39 AM

## 2014-07-14 NOTE — [facility] Group Notes (Signed)
0900 nursing orientation group   The focus of this group is to educate the patient on the purpose and policies of crisis stabilization and provide a format to answer questions about their admission.  The group details unit policies and expectations of patients while admitted.  Pt was disruptive, intrusive and loud throughout group.  He had to be redirected several times.

## 2014-07-15 LAB — VALPROIC ACID LEVEL: Valproic Acid Lvl: 23.1 ug/mL — ABNORMAL LOW (ref 50.0–100.0)

## 2014-07-15 LAB — GLUCOSE, CAPILLARY
Glucose-Capillary: 117 mg/dL — ABNORMAL HIGH (ref 70–99)
Glucose-Capillary: 155 mg/dL — ABNORMAL HIGH (ref 70–99)
Glucose-Capillary: 194 mg/dL — ABNORMAL HIGH (ref 70–99)
Glucose-Capillary: 191 mg/dL — ABNORMAL HIGH (ref 70–99)

## 2014-07-15 MED ORDER — LORAZEPAM 1 MG PO TABS: 1.0000 mg | ORAL_TABLET | Freq: Three times a day (TID) | ORAL | Status: DC

## 2014-07-15 MED ORDER — TRAZODONE HCL 100 MG PO TABS
200.0000 mg | ORAL_TABLET | Freq: Every day | ORAL | Status: DC
  Administered 2014-07-15: 200 mg via ORAL
  Filled 2014-07-15 (×3): qty 28

## 2014-07-15 MED ORDER — LAMOTRIGINE 100 MG PO TABS: 100.0000 mg | ORAL_TABLET | Freq: Every evening | ORAL | Status: DC

## 2014-07-15 MED ORDER — FUROSEMIDE 80 MG PO TABS: 80.0000 mg | ORAL_TABLET | Freq: Two times a day (BID) | ORAL | Status: DC

## 2014-07-15 MED ORDER — BENZTROPINE MESYLATE 1 MG PO TABS: 1.0000 mg | ORAL_TABLET | Freq: Two times a day (BID) | ORAL | Status: DC

## 2014-07-15 MED ORDER — HYDRALAZINE HCL 50 MG PO TABS: 50.0000 mg | ORAL_TABLET | Freq: Four times a day (QID) | ORAL | Status: DC

## 2014-07-15 MED ORDER — ALLOPURINOL 300 MG PO TABS: 300.0000 mg | ORAL_TABLET | Freq: Every day | ORAL | Status: DC

## 2014-07-15 MED ORDER — ARIPIPRAZOLE 15 MG PO TABS
15.0000 mg | ORAL_TABLET | Freq: Two times a day (BID) | ORAL | Status: DC
Start: 2014-07-15 — End: 2015-07-11
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
## 2014-07-15 NOTE — Progress Notes (Signed)
Patient lying in bed awake. Sitter at bedside and hall MHT obtaining VS at present. No complaints. Level I obs remains in place for safety and pt is safe. Christian [name]

## 2014-07-15 NOTE — Progress Notes (Signed)
1:1 progress note:  Pt is sitting on the bed in his room.  He had been napping earlier, but he is now awake.  Pt has been talking about music with the sitter.  He states he is ready to be discharged and hopes to go tomorrow.  Pt is denying SI/HI/AV at this time.  He has displayed some mild confusion when writer converses with him, but he has been pleasant and cooperative.  Pt continues on 1:1 for safety.  Sitter at bedside.  Pt safe at this time.
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
## 2014-07-15 NOTE — [facility] Suicide Risk Assessment (Signed)
[facility] INPATIENT:  Family/Significant Other Suicide Prevention Education  Suicide Prevention Education:  Education Completed; No one has been identified by the patient as the family member/significant other with whom the patient will be residing, and identified as the person(s) who will aid the patient in the event of a mental health crisis (suicidal ideations/suicide attempt).  With written consent from the patient, the family member/significant other has been provided the following suicide prevention education, prior to the and/or following the discharge of the patient.  The suicide prevention education provided includes the following:  Suicide risk factors  Suicide prevention and interventions  National Suicide Hotline telephone number  [facility] assessment telephone number  [facility] Emergency Assistance 911  [facility] and/or Residential Mobile Crisis Unit telephone number  Request made of family/significant other to:  Remove weapons (e.g., guns, rifles, knives), all items previously/currently identified as safety concern.    Remove drugs/medications (over-the-counter, prescriptions, illicit drugs), all items previously/currently identified as a safety concern.  The family member/significant other verbalizes understanding of the suicide prevention education information provided.  The family member/significant other agrees to remove the items of safety concern listed above.  The patient did not endorse SI at the time of admission, nor did the patient c/o SI during the stay here.  SPE not required.   Christian [name] 07/15/2014, 10:49 AM

## 2014-07-15 NOTE — Tx Team (Signed)
  Interdisciplinary Treatment Plan Update   Date Reviewed:  07/15/2014  Time Reviewed:  8:27 AM  Progress in Treatment:   Attending groups: Yes Participating in groups: Yes Taking medication as prescribed: Yes  Tolerating medication: Yes Family/Significant other contact made: Yes  Patient understands diagnosis: Yes  Discussing patient identified problems/goals with staff: Yes Medical problems stabilized or resolved: Yes Denies suicidal/homicidal ideation: Yes Patient has not harmed self or others: Yes  For review of initial/current patient goals, please see plan of care.  Estimated Length of Stay:  D/C today  Reason for Continuation of Hospitalization:   New Problems/Goals identified:  N/A  Discharge Plan or Barriers:   [facility],  Follow up outpt  Additional Comments:  Attendees:  Signature: Thedore Mins, MD 07/15/2014 8:27 AM   Signature: Richelle Ito, LCSW 07/15/2014 8:27 AM  Signature: [name], NP 07/15/2014 8:27 AM  Signature: Joslyn Devon, RN 07/15/2014 8:27 AM  Signature: Liborio Nixon, RN 07/15/2014 8:27 AM  Signature:  07/15/2014 8:27 AM  Signature:   07/15/2014 8:27 AM  Signature:    Signature:    Signature:    Signature:    Signature:    Signature:      Scribe for Treatment Team:   Richelle Ito, LCSW  07/15/2014 8:27 AM

## 2014-07-15 NOTE — Progress Notes (Signed)
Patient observed resting in bed with eyes closed. RR WNL, even and unlabored. No acute distress. Level I obs in place for safety and pt is safe. Lawrence Marseilles

## 2014-07-15 NOTE — B[facility] Group Notes (Signed)
[facility] LCSW Aftercare Discharge Planning Group Note   07/15/2014 2:49 PM  Participation Quality:  Did not attend    [place]

## 2014-07-15 NOTE — [facility] Suicide Risk Assessment (Signed)
   Demographic Factors:  Male  Total Time spent with patient: 20 minutes  Psychiatric Specialty Exam: Physical Exam  Psychiatric: He has a normal mood and affect. His speech is normal and behavior is normal. Judgment and thought content normal. Cognition and memory are normal.    Review of Systems  Constitutional: Negative.   HENT: Negative.   Eyes: Negative.   Respiratory: Negative.   Cardiovascular: Negative.   Gastrointestinal: Negative.   Genitourinary: Negative.   Musculoskeletal: Negative.   Skin: Negative.   Neurological: Positive for tremors.  Endo/Heme/Allergies: Negative.   Psychiatric/Behavioral: Negative.     Blood pressure 123/79, pulse 77, temperature 98.3 F (36.8 C), temperature source Oral, resp. rate 20, height 6' (1.829 m), weight 116.121 kg (256 lb), SpO2 99.00%.Body mass index is 34.71 kg/(m^2).  General Appearance: Fairly Groomed  Patent attorney::  Good  Speech:  Normal Rate  Volume:  Normal  Mood:  Euthymic  Affect:  Appropriate  Thought Process:  Circumstantial  Orientation:  Full (Time, Place, and Person)  Thought Content:  Negative  Suicidal Thoughts:  No  Homicidal Thoughts:  No  Memory:  Immediate;   Fair Recent;   Fair Remote;   Fair  Judgement:  marginal  Insight:  marginal  Psychomotor Activity:  Normal  Concentration:  Fair  Recall:  [employer] of Knowledge:Good  Language: Good  Akathisia:  No  Handed:  Right  AIMS (if indicated):     Assets:  Communication Skills Desire for Improvement  Sleep:  Number of Hours: 5.5    Musculoskeletal: Strength & Muscle Tone: within normal limits Gait & Station: normal Patient leans: N/A   Mental Status Per Nursing Assessment::   On Admission:  NA  Current Mental Status by Physician: patient denies suicidal ideation, intent or plan  Loss Factors: Decline in physical health  Historical Factors: poor impulse control  Risk Reduction Factors:   Sense of responsibility to family and  Positive social support  Continued Clinical Symptoms:  Resolving mood and psychotic symptoms  Cognitive Features That Contribute To Risk:  Closed-mindedness Polarized thinking    Suicide Risk:  Minimal: No identifiable suicidal ideation.  Patients presenting with no risk factors but with morbid ruminations; may be classified as minimal risk based on the severity of the depressive symptoms  Discharge Diagnoses:   AXIS I:  Bipolar affective disorder, current episode manic with psychotic symptoms  AXIS II:  Cluster B Traits AXIS III:   Past Medical History  Diagnosis Date  . Gout   . Hypertension   . Type II or unspecified type diabetes mellitus with unspecified complication, uncontrolled 06/02/2014  . Other and unspecified hyperlipidemia 06/02/2014  . OSA on CPAP 06/02/2014  . 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 CHF (congestive heart failure)   . CKD (chronic kidney disease), stage III    AXIS IV:  other psychosocial or environmental problems, problems related to social environment and problems with primary support group AXIS V:  61-70 mild symptoms  Plan Of Care/Follow-up recommendations:  Activity:  as tolerated  Diet:  healthy Tests:  routine Other:  patient to keep his after care appointment  Is patient on multiple antipsychotic therapies at discharge:  No   Has Patient had three or more failed trials of antipsychotic monotherapy by history:  No  Recommended Plan for Multiple Antipsychotic Therapies: NA    Thedore Mins, MD 07/15/2014, 10:05 AM

## 2014-07-16 LAB — GLUCOSE, CAPILLARY
Glucose-Capillary: 134 mg/dL — ABNORMAL HIGH (ref 70–99)
Glucose-Capillary: 152 mg/dL — ABNORMAL HIGH (ref 70–99)

## 2014-07-16 MED ORDER — GLYBURIDE 5 MG PO TABS
5.0000 mg | ORAL_TABLET | Freq: Every day | ORAL | Status: DC
  Administered 2014-07-16: 5 mg via ORAL
  Filled 2014-07-16: qty 14
  Filled 2014-07-16 (×2): qty 1

## 2014-07-16 MED ORDER — METFORMIN HCL ER 500 MG PO TB24: 500.0000 mg | ORAL_TABLET | Freq: Two times a day (BID) | ORAL | Status: DC

## 2014-07-16 MED ORDER — GLYBURIDE 5 MG PO TABS: 5.0000 mg | ORAL_TABLET | Freq: Every day | ORAL | Status: DC

## 2014-07-16 NOTE — B[facility] Suicide Risk Assessment (Addendum)
[facility]HH discharge suicide risk assessment  Demographic Factors:  Male and Low socioeconomic status  Total Time spent with patient: 20 minutes  Psychiatric Specialty Exam: Physical Exam  Constitutional: He is oriented to person, place, and time. He appears well-developed and well-nourished.  HENT:  Head: Normocephalic and atraumatic.  Eyes: Conjunctivae are normal. Pupils are equal, round, and reactive to light.  Neck: Normal range of motion. Neck supple.  Cardiovascular: Normal rate and regular rhythm.   Respiratory: Effort normal and breath sounds normal.  Musculoskeletal: He exhibits edema.  Neurological: He is alert and oriented to person, place, and time.  Skin: Skin is warm.  Psychiatric: He has a normal mood and affect. Thought content normal.    Review of Systems  Constitutional: Negative.   HENT: Negative.   Respiratory: Negative.   Cardiovascular: Negative.   Gastrointestinal: Negative.   Musculoskeletal: Positive for joint pain.  Skin: Negative.   Psychiatric/Behavioral: Negative for suicidal ideas and hallucinations. The patient does not have insomnia.     Blood pressure 139/76, pulse 85, temperature 98.1 F (36.7 C), temperature source Oral, resp. rate 16, height 6' (1.829 m), weight 117.028 kg (258 lb), SpO2 99.00%.Body mass index is 34.98 kg/(m^2).  General Appearance: Casual  Eye Contact::  Good  Speech:  Normal Rate  Volume:  Normal  Mood:  Euthymic  Affect:  Congruent  Thought Process:  Intact and Logical  Orientation:  Full (Time, Place, and Person)  Thought Content:  Denies AH/VH  Suicidal Thoughts:  No  Homicidal Thoughts:  No  Memory:  Immediate;   Fair Recent;   Fair Remote;   Fair  Judgement:  Fair  Insight:  Fair  Psychomotor Activity:  Normal  Concentration:  Fair  Recall:  Good  Fund of Knowledge:Fair  Language: Fair  Akathisia:  No    AIMS (if indicated):     Assets:  Communication Skills Desire for Improvement  Sleep:  Number of  Hours: 3.5    Musculoskeletal: Strength & Muscle Tone: within normal limits Gait & Station:In a wheel chair Patient leans: N/A   Mental Status Per Nursing Assessment::   On Admission:  NA  Current Mental Status by Physician: denies SI/AH/VH/HI  Loss Factors: Decline in physical health  Historical Factors: Prior suicide attempts and Impulsivity  Risk Reduction Factors:   Religious beliefs about death and Positive therapeutic relationship  Continued Clinical Symptoms:  Bipolar Disorder:Manic state (improving) Medical Diagnoses and Treatments/Surgeries  Cognitive Features That Contribute To Risk:  Thought constriction (tunnel vision)    Suicide Risk:  Minimal: No identifiable suicidal ideation.  Patients presenting with no risk factors but with morbid ruminations; may be classified as minimal risk based on the severity of the depressive symptoms  Discharge Diagnoses:  DSM 5 Primary Psychiatric Diagnosis: Bipolar I disorder,current episode Manic   Secondary psychiatric diagnosis: Unspecified neurocognitive disorder                                                             OSA on CPAP  Non Psychiatric diagnosis:  DM. Type II  Gout  Hypertension  BL LE edema  CKD  Hyperlipidemia  Diastolic CHF     Past Medical History  Diagnosis Date  . Gout   . Hypertension   . Mood swings   .  Type II or unspecified type diabetes mellitus with unspecified complication, uncontrolled 06/02/2014  . Other and unspecified hyperlipidemia 06/02/2014  . OSA on CPAP 06/02/2014  . 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 Bipolar disorder   . CHF (congestive heart failure)   . CKD (chronic kidney disease), stage III     Plan Of Care/Follow-up recommendations:  Activity:  on wheel chair,has BL LE edema which is chronic which limits his mobility Patient to follow up with out patient provider as scheduled.  Is patient on multiple antipsychotic  therapies at discharge:  No   Has Patient had three or more failed trials of antipsychotic monotherapy by history:  No  Recommended Plan for Multiple Antipsychotic Therapies: NA    Christian [name] 07/16/2014, 12:10 PM

## 2014-07-16 NOTE — Progress Notes (Signed)
[facility] Adult Case Management Discharge Plan :  Will you be returning to the same living situation after discharge: No. At discharge, do you have transportation home?:Yes,  [facility] Do you have the ability to pay for your medications:Yes,  mental health/MCR  Release of information consent forms completed and in the chart;  Patient's signature needed at discharge.  Patient to Follow up at: Follow-up Information   Follow up with [facility] On 07/18/2014. (Go to your hospital follow up appointment on Thursday between 7:45 and 10AM       Make sure to ask to see Ginger in the PAP program for help with your medications)    Contact information:   [address]  [place]  [phone]      Patient denies SI/HI:   Yes,  yes    Safety Planning and Suicide Prevention discussed:  Yes,  yes  Ida Rogue 07/16/2014, 2:22 PM  After a stay of 26 days, 22 of which he was on the [facility] wait list, Christian [name] is d/cing to a [facility] today.  We were fortunate to find one to take him as he has MCR only, no MCD, and is on multiple medications.  Christian [name] was willing to  an agreement whereby we would pay for the first month of meds [along with sending him there with a 14 day supply] so that they could take him to a medical Dr in [facility] see about changing his medications so that his copay goes down.  The brother is willing to pay for medications if the bill is reasonable.  In the meantime, the wife is upset because she believed that he needed [facility] and we needed to keep him until a bed became available.

## 2014-07-16 NOTE — B[facility] Group Notes (Signed)
Adult Psychoeducational Group Note  Date:  07/16/2014 Time:  12:23 AM  Group Topic/Focus:  Wrap-Up Group:   The focus of this group is to help patients review their daily goal of treatment and discuss progress on daily workbooks.  Participation Level:  Did Not Attend  Participation Quality:  NONE  Affect:  NONE  Cognitive:  NONE  Insight: None  Engagement in Group:  NONE  Modes of Intervention:  NONE  Additional Comments:  Pt did not attend group.  Delia Chimes 07/16/2014, 12:23 AM

## 2014-07-16 NOTE — Progress Notes (Signed)
Pt is sitting in a chair in his room asleep.  Sitter reports that he would not lie down in his bed and argued with her that he was not sleepy.  Sitter says he has been asleep for a little while.  He appears to be sleeping comfortably even though he is sitting up.  Pt continues to be on a 1:1 for safety.  Pt safe at this time.

## 2014-07-16 NOTE — Progress Notes (Signed)
Pt was in his room, angry with the sitter for trying to steady him when he lost his balance.  He was arguing with her about how she steadied him to prevent his falling.  Writer spoke with pt and together with the sitter was able to help pt calm down.  He allowed Clinical research associate to get his daily weight at this time, and then go to the dayroom to have his vitals checked.  Pt chose to stay in the dayroom to watch TV and he began talking to a male peer.  Pt says he is going home today.  Pt continues on 1:1 for high fall risk.  Sitter with patient.  Pt remains safe.

## 2014-07-16 NOTE — Progress Notes (Signed)
Patient ID: Christian [name], male   DOB: [birth year], 63 y.o.   MRN: [id number] He has been discharged and was picked up by the  Group home staff. Discharge instruction was explained to him and he signed that he understood. Discharge information , medication  Was given to the group home driver. He denies thoughts of SI and all his belongings were taken home with him. He was happy that he was leaving and he was talking to the people form the group home.

## 2014-07-16 NOTE — Progress Notes (Signed)
This patient has a SCr= 1.6 as of 07/04/14.  Will hold off on Metformin order until after checking SCr today and start this if SCr < 1.5 per hospital policy.

## 2014-07-16 NOTE — Progress Notes (Signed)
The focus of this group is to educate the patient on the purpose and policies of crisis stabilization and provide a format to answer questions about their admission.  The group details unit policies and expectations of patients while admitted.  Patient did not attend 0900 nurse education orientation group this morning.  Patient stayed in his room. 

## 2014-07-16 NOTE — [facility] Suicide Risk Assessment (Signed)
Suicide Risk Assessment  Discharge Assessment     Demographic Factors:  Male  Total Time spent with patient: 30 minutes  Psychiatric Specialty Exam:     Blood pressure 139/76, pulse 85, temperature 98.1 F (36.7 C), temperature source Oral, resp. rate 16, height 6' (1.829 m), weight 117.028 kg (258 lb), SpO2 99.00%.Body mass index is 34.98 kg/(m^2).  General Appearance: Disheveled  Eye Solicitor::  Fair  Speech:  Clear and Coherent  Volume:  Normal  Mood:  Anxious  Affect:  Appropriate  Thought Process:  Coherent and Goal Directed  Orientation:  Full (Time, Place, and Person)  Thought Content:  plans as he moves on, worries and concerns  Suicidal Thoughts:  No  Homicidal Thoughts:  No  Memory:  Immediate;   Fair Recent;   Fair Remote;   Fair  Judgement:  Fair  Insight:  Shallow  Psychomotor Activity:  Normal  Concentration:  Fair  Recall:  [employer] of Knowledge:NA  Language: Fair  Akathisia:  No  Handed:    AIMS (if indicated):     Assets:  Desire for Improvement Housing Social Support  Sleep:  Number of Hours: 3.5    Musculoskeletal: Strength & Muscle Tone: within normal limits Gait & Station: normal Patient leans: N/A   Mental Status Per Nursing Assessment::   On Admission:  NA  Current Mental Status by Physician: In full contact with reality. There are no active SI plans or intent. He is going to go to a group home. States he is ready to be D/C   Loss Factors: Decline in physical health  Historical Factors: NA  Risk Reduction Factors:   Living with another person, especially a relative  Continued Clinical Symptoms:  Bipolar Disorder:   Mixed State  Cognitive Features That Contribute To Risk:  Closed-mindedness Polarized thinking Thought constriction (tunnel vision)    Suicide Risk:  Minimal: No identifiable suicidal ideation.  Patients presenting with no risk factors but with morbid ruminations; may be classified as minimal risk based on  the severity of the depressive symptoms  Discharge Diagnoses:   AXIS I:  Bipolar Disorder Disorder, Manic with psychotic symptoms AXIS II:  No diagnosis AXIS III:   Past Medical History  Diagnosis Date  . Gout   . Hypertension   . Mood swings   . Type II or unspecified type diabetes mellitus with unspecified complication, uncontrolled 06/02/2014  . Other and unspecified hyperlipidemia 06/02/2014  . OSA on CPAP 06/02/2014  . 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 Bipolar disorder   . CHF (congestive heart failure)   . CKD (chronic kidney disease), stage III    AXIS IV:  other psychosocial or environmental problems AXIS V:  61-70 mild symptoms  Plan Of Care/Follow-up recommendations:  Activity as tolerated Diet as per dietitian Follow up outpatient basis Is patient on multiple antipsychotic therapies at discharge:  No   Has Patient had three or more failed trials of antipsychotic monotherapy by history:  No  Recommended Plan for Multiple Antipsychotic Therapies: NA    Mateusz Neilan A 07/16/2014, 12:28 PM

## 2014-07-19 ENCOUNTER — Emergency Department: Payer: Self-pay | Admitting: Emergency Medicine

## 2014-07-19 LAB — COMPREHENSIVE METABOLIC PANEL
Albumin: 3.9 g/dL (ref 3.4–5.0)
Alkaline Phosphatase: 65 U/L
Anion Gap: 15 (ref 7–16)
BUN: 23 mg/dL — ABNORMAL HIGH (ref 7–18)
Bilirubin,Total: 0.8 mg/dL (ref 0.2–1.0)
Calcium, Total: 9.6 mg/dL (ref 8.5–10.1)
Chloride: 103 mmol/L (ref 98–107)
Co2: 26 mmol/L (ref 21–32)
Creatinine: 2.26 mg/dL — ABNORMAL HIGH (ref 0.60–1.30)
EGFR (African American): 35 — ABNORMAL LOW
EGFR (Non-African Amer.): 30 — ABNORMAL LOW
Glucose: 257 mg/dL — ABNORMAL HIGH (ref 65–99)
Osmolality: 299 (ref 275–301)
Potassium: 3.3 mmol/L — ABNORMAL LOW (ref 3.5–5.1)
SGOT(AST): 41 U/L — ABNORMAL HIGH (ref 15–37)
SGPT (ALT): 53 U/L
Sodium: 144 mmol/L (ref 136–145)
Total Protein: 7.8 g/dL (ref 6.4–8.2)

## 2014-07-19 LAB — CBC
HCT: 42.4 % (ref 40.0–52.0)
HGB: 13.8 g/dL (ref 13.0–18.0)
MCH: 30.4 pg (ref 26.0–34.0)
MCHC: 32.6 g/dL (ref 32.0–36.0)
MCV: 93 fL (ref 80–100)
Platelet: 148 10*3/uL — ABNORMAL LOW (ref 150–440)
RBC: 4.54 10*6/uL (ref 4.40–5.90)
RDW: 15 % — ABNORMAL HIGH (ref 11.5–14.5)
WBC: 5.4 10*3/uL (ref 3.8–10.6)

## 2014-07-19 LAB — TSH: Thyroid Stimulating Horm: 0.92 u[IU]/mL

## 2014-07-19 LAB — SALICYLATE LEVEL: Salicylates, Serum: <1.7 mg/dL

## 2014-07-19 LAB — ETHANOL
Ethanol %: <0.003 % (ref 0.000–0.080)
Ethanol: <3 mg/dL

## 2014-07-19 LAB — ACETAMINOPHEN LEVEL: Acetaminophen: <2 ug/mL

## 2014-07-19 LAB — CK: CK, Total: 538 U/L — ABNORMAL HIGH

## 2014-07-19 NOTE — Progress Notes (Signed)
Patient Discharge Instructions:  After Visit Summary (AVS):   Faxed to:  07/19/14 Psychiatric Admission Assessment Note:   Faxed to:  07/19/14 Suicide Risk Assessment - Discharge Assessment:   Faxed to:  07/19/14 Faxed/Sent to the Next Level Care provider:  07/19/14 Faxed to [facility] @ [phone]  Jerelene Redden, 07/19/2014, 3:18 PM

## 2014-07-20 LAB — BASIC METABOLIC PANEL
Anion Gap: 7 (ref 7–16)
BUN: 22 mg/dL — ABNORMAL HIGH (ref 7–18)
Calcium, Total: 8.7 mg/dL (ref 8.5–10.1)
Chloride: 109 mmol/L — ABNORMAL HIGH (ref 98–107)
Co2: 31 mmol/L (ref 21–32)
Creatinine: 1.66 mg/dL — ABNORMAL HIGH (ref 0.60–1.30)
EGFR (African American): 51 — ABNORMAL LOW
EGFR (Non-African Amer.): 44 — ABNORMAL LOW
Glucose: 95 mg/dL (ref 65–99)
Osmolality: 296 (ref 275–301)
Potassium: 3.5 mmol/L (ref 3.5–5.1)
Sodium: 147 mmol/L — ABNORMAL HIGH (ref 136–145)

## 2014-07-20 LAB — URINALYSIS, COMPLETE
Bacteria: NONE SEEN
Bilirubin,UR: NEGATIVE
Blood: NEGATIVE
Glucose,UR: >=500 mg/dL (ref 0–75)
Hyaline Cast: 3
Ketone: NEGATIVE
Leukocyte Esterase: NEGATIVE
Nitrite: NEGATIVE
Ph: 5 (ref 4.5–8.0)
Protein: 100
RBC,UR: 1 /HPF (ref 0–5)
Specific Gravity: 1.012 (ref 1.003–1.030)
Squamous Epithelial: <1
WBC UR: 3 /HPF (ref 0–5)

## 2014-07-20 LAB — DRUG SCREEN, URINE

## 2014-07-23 LAB — CBC WITH DIFFERENTIAL/PLATELET
Comment - H1-Com1: NORMAL
Comment - H1-Com2: NORMAL
Eosinophil: 1 %
HCT: 33.5 % — ABNORMAL LOW (ref 40.0–52.0)
HGB: 10.8 g/dL — ABNORMAL LOW (ref 13.0–18.0)
Lymphocytes: 22 %
MCH: 30.2 pg (ref 26.0–34.0)
MCHC: 32.2 g/dL (ref 32.0–36.0)
MCV: 94 fL (ref 80–100)
Monocytes: 5 %
Platelet: 105 10*3/uL — ABNORMAL LOW (ref 150–440)
RBC: 3.57 10*6/uL — ABNORMAL LOW (ref 4.40–5.90)
RDW: 14.5 % (ref 11.5–14.5)
Segmented Neutrophils: 72 %
WBC: 3.8 10*3/uL (ref 3.8–10.6)

## 2014-07-23 LAB — COMPREHENSIVE METABOLIC PANEL
Albumin: 2.7 g/dL — ABNORMAL LOW (ref 3.4–5.0)
Alkaline Phosphatase: 55 U/L
Anion Gap: 7 (ref 7–16)
BUN: 19 mg/dL — ABNORMAL HIGH (ref 7–18)
Bilirubin,Total: 0.5 mg/dL (ref 0.2–1.0)
Calcium, Total: 8 mg/dL — ABNORMAL LOW (ref 8.5–10.1)
Chloride: 102 mmol/L (ref 98–107)
Co2: 30 mmol/L (ref 21–32)
Creatinine: 1.73 mg/dL — ABNORMAL HIGH (ref 0.60–1.30)
EGFR (African American): 49 — ABNORMAL LOW
EGFR (Non-African Amer.): 42 — ABNORMAL LOW
Glucose: 150 mg/dL — ABNORMAL HIGH (ref 65–99)
Osmolality: 283 (ref 275–301)
Potassium: 3.7 mmol/L (ref 3.5–5.1)
SGOT(AST): 36 U/L (ref 15–37)
SGPT (ALT): 39 U/L
Sodium: 139 mmol/L (ref 136–145)
Total Protein: 6 g/dL — ABNORMAL LOW (ref 6.4–8.2)

## 2014-08-01 LAB — BASIC METABOLIC PANEL
Anion Gap: 4 — ABNORMAL LOW (ref 7–16)
BUN: 21 mg/dL — ABNORMAL HIGH (ref 7–18)
Calcium, Total: 9.1 mg/dL (ref 8.5–10.1)
Chloride: 102 mmol/L (ref 98–107)
Co2: 32 mmol/L (ref 21–32)
Creatinine: 1.56 mg/dL — ABNORMAL HIGH (ref 0.60–1.30)
EGFR (African American): 55 — ABNORMAL LOW
EGFR (Non-African Amer.): 48 — ABNORMAL LOW
Glucose: 107 mg/dL — ABNORMAL HIGH (ref 65–99)
Osmolality: 279 (ref 275–301)
Potassium: 3.6 mmol/L (ref 3.5–5.1)
Sodium: 138 mmol/L (ref 136–145)

## 2014-08-01 LAB — URINALYSIS, COMPLETE
Bacteria: NONE SEEN
Bilirubin,UR: NEGATIVE
Blood: NEGATIVE
Glucose,UR: NEGATIVE mg/dL (ref 0–75)
Hyaline Cast: 4
Ketone: NEGATIVE
Leukocyte Esterase: NEGATIVE
Nitrite: NEGATIVE
Ph: 6 (ref 4.5–8.0)
Protein: NEGATIVE
RBC,UR: 1 /HPF (ref 0–5)
Specific Gravity: 1.008 (ref 1.003–1.030)
Squamous Epithelial: 5
WBC UR: 1 /HPF (ref 0–5)

## 2014-08-01 LAB — PROTEIN / CREATININE RATIO, URINE
Creatinine, Urine: 61.9 mg/dL (ref 30.0–125.0)
Protein, Random Urine: 17 mg/dL — ABNORMAL HIGH (ref 0–12)
Protein/Creat. Ratio: 275 mg/gCREAT — ABNORMAL HIGH (ref 0–200)

## 2014-08-01 LAB — SODIUM, URINE, RANDOM: Sodium, Urine Random: 73 mmol/L (ref 20–110)

## 2014-08-02 ENCOUNTER — Ambulatory Visit: Payer: Self-pay | Admitting: Urology

## 2014-08-02 LAB — BASIC METABOLIC PANEL
Anion Gap: 6 — ABNORMAL LOW (ref 7–16)
BUN: 20 mg/dL — ABNORMAL HIGH (ref 7–18)
Calcium, Total: 9.1 mg/dL (ref 8.5–10.1)
Chloride: 99 mmol/L (ref 98–107)
Co2: 30 mmol/L (ref 21–32)
Creatinine: 1.64 mg/dL — ABNORMAL HIGH (ref 0.60–1.30)
EGFR (African American): 52 — ABNORMAL LOW
EGFR (Non-African Amer.): 45 — ABNORMAL LOW
Glucose: 195 mg/dL — ABNORMAL HIGH (ref 65–99)
Osmolality: 278 (ref 275–301)
Potassium: 3.6 mmol/L (ref 3.5–5.1)
Sodium: 135 mmol/L — ABNORMAL LOW (ref 136–145)

## 2014-08-02 LAB — UR PROT ELECTROPHORESIS, URINE RANDOM
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
## 2014-08-10 LAB — CBC
HCT: 40.1 % (ref 40.0–52.0)
HGB: 12.8 g/dL — ABNORMAL LOW (ref 13.0–18.0)
MCH: 30.2 pg (ref 26.0–34.0)
MCHC: 31.8 g/dL — ABNORMAL LOW (ref 32.0–36.0)
MCV: 95 fL (ref 80–100)
Platelet: 143 10*3/uL — ABNORMAL LOW (ref 150–440)
RBC: 4.23 10*6/uL — ABNORMAL LOW (ref 4.40–5.90)
RDW: 14 % (ref 11.5–14.5)
WBC: 2.4 10*3/uL — ABNORMAL LOW (ref 3.8–10.6)

## 2014-08-10 LAB — COMPREHENSIVE METABOLIC PANEL
Albumin: 3.3 g/dL — ABNORMAL LOW (ref 3.4–5.0)
Alkaline Phosphatase: 81 U/L
Anion Gap: 7 (ref 7–16)
BUN: 13 mg/dL (ref 7–18)
Bilirubin,Total: 0.3 mg/dL (ref 0.2–1.0)
Calcium, Total: 8.8 mg/dL (ref 8.5–10.1)
Chloride: 105 mmol/L (ref 98–107)
Co2: 27 mmol/L (ref 21–32)
Creatinine: 1.25 mg/dL (ref 0.60–1.30)
EGFR (African American): >60
EGFR (Non-African Amer.): >60
Glucose: 109 mg/dL — ABNORMAL HIGH (ref 65–99)
Osmolality: 278 (ref 275–301)
Potassium: 4.3 mmol/L (ref 3.5–5.1)
SGOT(AST): 96 U/L — ABNORMAL HIGH (ref 15–37)
SGPT (ALT): 187 U/L — ABNORMAL HIGH
Sodium: 139 mmol/L (ref 136–145)
Total Protein: 7 g/dL (ref 6.4–8.2)

## 2014-08-12 LAB — PROTEIN ELECTROPHORESIS(ARMC)

## 2015-03-10 DIAGNOSIS — E559 Vitamin D deficiency, unspecified: Secondary | ICD-10-CM | POA: Diagnosis not present

## 2015-03-10 DIAGNOSIS — F3112 Bipolar disorder, current episode manic without psychotic features, moderate: Secondary | ICD-10-CM | POA: Diagnosis not present

## 2015-03-10 DIAGNOSIS — E782 Mixed hyperlipidemia: Secondary | ICD-10-CM | POA: Diagnosis not present

## 2015-03-10 DIAGNOSIS — N182 Chronic kidney disease, stage 2 (mild): Secondary | ICD-10-CM | POA: Diagnosis not present

## 2015-03-10 DIAGNOSIS — M109 Gout, unspecified: Secondary | ICD-10-CM | POA: Diagnosis not present

## 2015-03-10 DIAGNOSIS — E1129 Type 2 diabetes mellitus with other diabetic kidney complication: Secondary | ICD-10-CM | POA: Diagnosis not present

## 2015-03-10 DIAGNOSIS — I509 Heart failure, unspecified: Secondary | ICD-10-CM | POA: Diagnosis not present

## 2015-03-10 DIAGNOSIS — I130 Hypertensive heart and chronic kidney disease with heart failure and stage 1 through stage 4 chronic kidney disease, or unspecified chronic kidney disease: Secondary | ICD-10-CM | POA: Diagnosis not present
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
## 2015-03-22 NOTE — Consult Note (Signed)
PATIENT NAME:  Christian [name], Christian [name] MR#:  [id number] DATE OF BIRTH:  04-Jan-1951  DATE OF CONSULTATION:  08/01/2014  REFERRING PHYSICIAN:   CONSULTING PHYSICIAN:  Gonzella Lex, MD  HISTORY OF PRESENT ILLNESS: The patient has continued to remain in the Emergency Room. He has no new complaints, does not present any new information. He has met with his sister on a couple of occasions and spoken to his wife on the phone. None of this seems to have changed his thinking. The patient is denying any hallucinations. Denies suicidal or homicidal ideation. He has no new physical complaints.   MENTAL STATUS EXAMINATION: Manages to keep himself reasonably well groomed. Good eye contact. Psychomotor activity slow and appropriate. Speech: Normal rate, tone and volume. Affect euthymic. Mood stated as being okay. Thoughts still feature some delusional content and paranoia. No threatening behavior. He is alert and oriented x 4. Normal intelligence.   ASSESSMENT: The patient continues to have some paranoia and poor insight, although it is possible he may have calmed down a little bit compared to how he was on admission. He has been compliant with medication. So far, we have been planning on transfer to [facility] based on the history of failure to return to baseline with aggressive behavior despite medication. Transfer to [facility] has been slow coming. Every time I discuss with the patient, the possibility of admission to the hospital, he becomes very agitated and makes it clear that he would not cooperate. He has been taking medicine and I have started him on Seroquel and titrated the dose up a bit.   I appreciate the consult from Dr. Holley Raring.  The patient's renal function appears to be improving. Continue treating diabetes.   The patient and I had a discussion about longer term plans. His lack of insight is getting in the way of making realistic plans. I know his family would like him to go to a  longer term hospital. He has already failed one placement at a group home. It is possible that we may consider replacing him at another group home, at this point. I think he may have had a little bit of improvement, although he (Dictation Anomaly) <<is still not atMISSING TEXT>> at baseline.  DIAGNOSIS, PRINCIPAL AND PRIMARY:  AXIS I: Bipolar disorder, type I, mixed.    ____________________________ Gonzella Lex, MD jtc:TT D: 08/01/2014 21:29:00 ET T: 08/01/2014 22:06:06 ET JOB#: [id number]  cc: Gonzella Lex, MD, <Dictator> Gonzella Lex MD ELECTRONICALLY SIGNED 08/10/2014 22:24
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
## 2015-03-22 NOTE — Consult Note (Signed)
Psychiatry: Follow up note patient in the ER. No new complaint. Mood feeling stable. Behavior has been calm and self care is good. Complient with medication. Denies any suicidal ideation. Denies any hallucinations. Still some signs of grandiosity. Still gets easily derailed. Wife has not definitively replied as to possibility for taking him home. We have reason to believe bed may be availible at [facility] this weekend. No change to medication. Continue current treatment. IVC renewed.  Electronic Signatures: Clapacs, Jackquline Denmark (MD)  (Signed on 11-Sep-15 16:32)  Authored  Last Updated: 11-Sep-15 16:32 by Audery Amel (MD)

## 2015-03-22 NOTE — Consult Note (Signed)
Brief Consult Note: Diagnosis: bipolar disorder.   Patient was seen by consultant.   Recommend further assessment or treatment.   Discussed with Attending MD.   Comments: Psychiatry: Patient seen. No new complaints. Still focused on being wronged rather than on symptoms. Still cries easily but also controlls it. Denies any SI or HI. No current behavior problems. Cooperative with meds.  Continue meds. Educational and suppportive counceling. Reassess tomorrow whether better to continue to wait for [facility] or go to group home.  Electronic Signatures: Audery Amel (MD)  (Signed 07-Sep-15 14:05)  Authored: Brief Consult Note   Last Updated: 07-Sep-15 14:05 by Audery Amel (MD)

## 2015-03-22 NOTE — Consult Note (Signed)
Psychiatry: Follow-up for this 64 year old gentleman with bipolar disorder.  Patient has no new complaints today.  On interview the patient continues to repeat the same story.  He continues to appear a little bit confused.  No obvious hallucinations.  No suicidal or homicidal ideation.  No violent or aggressive behavior. medications well.  As far as the urology concern it seems like urology does not find anything that requires immediate treatment.  Labs are normalizing. reports that his wife is going to visit within the next day or 2.  I will be interested in hearing her thoughts on how he is doing.  We have continued to await transfer [facility] but if his current state appears to be stable we may prefer to try placement again.  No change to current medication. bipolar disorder mixed symptoms.  Electronic Signatures: Wayde Gopaul, Jackquline Denmark (MD)  (Signed on 05-Sep-15 20:48)  Authored  Last Updated: 05-Sep-15 20:48 by Audery Amel (MD)

## 2015-03-22 NOTE — Consult Note (Signed)
Psychiatry: Follow-up note for this 64 year old gentleman with bipolar disorder currently in the emergency room.  He has no new or different complaints today.  Continues to do display mixed symptoms of both mania and depression.  He has been cooperative with medication but is difficult to engage in conversation because of his un lucid thinking.  He has not been violent or threatening in the emergency room.  He continues to get agitated and somewhat flustered at the idea of admission to the psychiatric ward.  On review of systems he has no new complaints.  On mental status exam he continues to show labile mood with disorganized thinking some evidence of paranoia or grandiosity and lack of reality testing.  Does not seem to be obviously hallucinating.  Denies suicidal ideation. had a very nice and illuminating conversation with Dr. Nolen Mu who is his long-term outpatient psychiatrist.  We reviewed his past treatment and possible options that are left.  We agreed that ECT would be a reasonable treatment in his current situation but it is difficult to implement.  For now we are continuing his current medicine.  I will continue to try and work with him and see if I can get him to agree to inpatient hospitalization here and possibly then agreed to ECT.  Meanwhile he has been referred to [facility].  After our discussion it sounds like adding Seroquel would be reasonable.  Currently the dose of Abilify he is taking is not enough to get him well or to treat all of his psychotic symptoms.  Electronic Signatures: Audery Amel (MD)  (Signed on 26-Aug-15 17:43)  Authored  Last Updated: 26-Aug-15 17:43 by Audery Amel (MD)
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
## 2015-03-22 NOTE — Consult Note (Signed)
Chief Complaint:  Subjective/Chief Complaint L hydronephrosis   Brief Assessment:  GEN well developed   Cardiac Regular   Respiratory normal resp effort   Gastrointestinal Normal   EXTR negative cyanosis/clubbing, negative edema   Additional Physical Exam no CVAT   Lab Results: Routine Chem:  04-Sep-15 13:14   Glucose, Serum  195  BUN  20  Creatinine (comp)  1.64  Sodium, Serum  135  Potassium, Serum 3.6  Chloride, Serum 99  CO2, Serum 30  Calcium (Total), Serum 9.1  Anion Gap  6  Osmolality (calc) 278  eGFR (African American)  52  eGFR (Non-African American)  45 (eGFR values <47m/min/1.73 m2 may be an indication of chronic kidney disease (CKD). Calculated eGFR is useful in patients with stable renal function. The eGFR calculation will not be reliable in acutely ill patients when serum creatinine is changing rapidly. It is not useful in  patients on dialysis. The eGFR calculation may not be applicable to patients at the low and high extremes of body sizes, pregnant women, and vegetarians.)   Radiology Results: CT:    04-Sep-15 18:49, CT Abdomen and Pelvis W/WO Contrast  CT Abdomen and Pelvis W/WO Contrast   REASON FOR EXAM:    (1) hydronephrosis, PLEASE DO CT UROGRAM (pre, post   and delayed contrast imaging  COMMENTS:       PROCEDURE: CT  - CT ABDOMEN / PELVIS  W/WO  - Aug 02 2014  6:49PM     CLINICAL DATA:  Left hydronephrosis by ultrasound unenhancedCT.    EXAM:  CT ABDOMEN AND PELVIS WITHOUT AND WITH CONTRAST    TECHNIQUE:  Multidetector CT imaging of the abdomen and pelvis was performed  following the standard protocol before and following the bolus  administration of intravenous contrast.  CONTRAST:  100 mL Isovue 370 IV    COMPARISON:  Unenhanced CT and renal ultrasound on 08/01/2014    FINDINGS:  Some low left hydronephrosis with predominant dilatation of the  renal pelvis and milder dilatation of the collecting system. After  administration  of contrast, there is no delayed perfusion of the  left kidney and delayed imaging shows prompt excretion. No enhancing  masses or calculi identified. Etiology of dilatation is likely  relative UPJ obstruction. Mesenteric arteries and veins coarse just  medial to the UPJ.    Mildly prominent right extrarenal pelvis without evidence of  right-sided hydronephrosis. No masses or calculi are identified on  the right.    The liver, gallbladder, pancreas, spleen, adrenal glands and bowel  are unremarkable. No inflammatory process or bowel obstruction is  identified. No free air or free fluid. Small umbilical hernia  contains fat. No focal masses or enlarged lymph nodes.    No vascular abnormalities are identified. The bladder is  unremarkable.Degenerative changes are present in the lumbar spine.     IMPRESSION:  Left-sided hydronephrosis primarily affecting the renal pelvis.  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 No enhancing masses or calculi identified.  Electronically Signed    By: GAletta EdouardM.D.    On: 08/02/2014 19:39         Verified By: GAzzie Roup M.D.,  Nuclear Med:    05-Sep-15 11:02, Kidney Function With Lasix (2 of 2)  Kidney Function With Lasix (2 of 2)   REASON FOR EXAM:    left hydronephrosis, assess for obstruction. Please   give Lasix  48m IVP, EDP ins  COMMENTS:   LMP: (Male)    PROCEDURE: NM  - NM  RENAL LASIX  2 38F 2  - Aug 03 2014 11:02AM     CLINICAL DATA:  Left hydronephrosis, evaluate for obstruction    EXAM:  NUCLEAR MEDICINE RENAL SCAN WITH DIURETIC ADMINISTRATION    TECHNIQUE:  Radionuclide angiographic and sequential renal images were obtained  after intravenous injection of radiopharmaceutical. Imaging was  continued during slow intravenous injection of Lasix approximately  15 minutes after the start of the  examination.    RADIOPHARMACEUTICALS:  5.4 mCi Tc-913mAG3    COMPARISON:  CT abdomen pelvis dated 08/02/2014    FINDINGS:  Flow:  Prompt symmetric arterial flow to the kidneys.    Left renogram: Prominent left renal pelvis with a UPJ configuration.    Right renogram: Normal excretion.    Differential:  Left kidney = 51.6 %    Right kidney = 48.4 %    T1/2 post Lasix :    Left kidney = 21.4 min    Right kidney = 20.9 min    IMPRESSION:  Left extrarenal pelvis/UPJ configuration, better evaluated on CT.    Symmetric excretion of the bilateral kidneys.  No evidence of obstruction.      Electronically Signed    By: SrJulian Hy.D.    On: 08/03/2014 13:15         Verified By: SRJulian HyM.D.,   Assessment/Plan:  Assessment/Plan:  Assessment [age]ear old man with incidental left hydronephrosis.  CT and Lasix Renal scan reviewed which both show symmetric uptake and drainage of both kidneys, indicating there is no significant obstruction on the left. I do see a small crossing vessel on the left kidney which may have lead to a UPJ obstruction. Considering it is likely chronic in nature, not impacting his renal function, and not causing symptoms, I would not recommend any intervention at this time.   Plan 1) No need for acute inpatient Urologic surgical intervention at this time. 2) Follow-up with Urology as an outpatient if he develops flank pain, recurrent UTIs or pyelonephritis. 3) Will sign off  Thank you for involving me in this patients care. Please do not hesitate to contact me if I can be of any further assistance.   Electronic Signatures: KaPrentiss BellsMD)  (Signed 05-Sep-15 16:18)  Authored: Chief Complaint, Brief Assessment, Lab Results, Radiology Results, Assessment/Plan   Last Updated: 05-Sep-15 16:18 by KaPrentiss BellsMD)

## 2015-03-22 NOTE — Consult Note (Signed)
PATIENT NAME:  Christian [name], Christian [name] MR#:  [id number] DATE OF BIRTH:  1951-11-25  DATE OF CONSULTATION:  07/23/2014  REFERRING PHYSICIAN:   CONSULTING PHYSICIAN:  Audery Amel, MD  Psychiatry followup note for this 64 year old man with bipolar disorder. The patient's mental state and behavior continues about the same as yesterday. He continues to show labile mood with episodes of crying alternating with irritability. He continues to be able to articulate that he has bipolar disorder but shows little insight into the problems he is still having. He has been compliant with medication down here. At his request, I spoke to his wife on the phone. She has been with him for 35 years and knows his bipolar disorder. She is clear that he is still not at his baseline. The patient is not able to return home because of his recent behavior.   This is a 64 year old man who continues to show disorganized, psychotic thought with grandiosity, labile mood, poor insight. Recent history of violence and aggression. Has not responded fully to recent medication trials.   PLAN: To refer the patient to [facility] for longer term inpatient care. Behavior and history makes him not appropriate for inpatient care at our facility right now. Continue current medicines as prescribed. Continue to monitor vital signs.   DIAGNOSIS, PRINCIPAL AND PRIMARY:  AXIS I: Bipolar disorder type I, mixed.    ____________________________ Audery Amel, MD jtc:at D: 07/23/2014 14:40:59 ET T: 07/23/2014 14:46:33 ET JOB#: [id number]  cc: Audery Amel, MD, <Dictator> Audery Amel MD ELECTRONICALLY SIGNED 08/09/2014 17:04

## 2015-03-22 NOTE — Consult Note (Signed)
Psychiatry: Follow-up for this patient with bipolar disorder currently with mixed symptoms in the emergency room.  Patient has no new complaints today.  He continues to show only partial insight into his illness.  He tends to minimize or deny acute symptoms.  Also refuses to engage in conversation in about current symptoms and impairment.  He has been compliant with current medication orders.  Not engaging in any acutely violent or threatening behavior. is alert and oriented.  Affect is anxious and dysphoric.  Thoughts often scattered.  Tends to perseverate on insisting that I am calling him "crazy" or "insane".  I have used neither of these words in reference to him and I have pointed that out repeatedly.  I advised him that I still believe he is not at his baseline based on his own behavior as well as the testimony of his wife and outpatient psychiatrist.  He continues to have some hyper religious and grandiose behavior. broached the subject of ECT with the patient today.  He tells me that he has had ECT in the past and admits that it has been of some help.  Nevertheless he refused to engage in any practical discussion about it now.  Also he once again became agitated when I proposed bringing him downstairs to the psychiatry ward.  He requests being allowed to speak with his wife and outpatient psychiatrist on the telephone.  I told him that he has always been allowed to speak to anyone on the telephone who is willing to speak to him.  I will leave a message with his outpatient psychiatrist asking if she would be willing to call him here at the emergency room.  I do not know whether she would consider that appropriate or not but I can try.  Meanwhile I have started him on Seroquel at night in addition to the Abilify.  We can gradually titrate that up.  He remains on the list for [facility]. Bipolar disorder type I mixed episode with psychotic features  Electronic Signatures: Audery Amel  (MD)  (Signed on 27-Aug-15 22:25)  Authored  Last Updated: 27-Aug-15 22:25 by Audery Amel (MD)

## 2015-03-27 DIAGNOSIS — R7989 Other specified abnormal findings of blood chemistry: Secondary | ICD-10-CM | POA: Diagnosis not present

## 2015-06-28 ENCOUNTER — Encounter (HOSPITAL_COMMUNITY): Payer: Self-pay

## 2015-06-28 ENCOUNTER — Emergency Department (HOSPITAL_COMMUNITY)
Admission: EM | Admit: 2015-06-28 | Discharge: 2015-06-30 | Disposition: A | Discharge status: Other Acute Inpatient Hospital | Payer: Commercial Managed Care - HMO | Attending: Emergency Medicine | Admitting: Emergency Medicine

## 2015-06-28 DIAGNOSIS — M109 Gout, unspecified: Secondary | ICD-10-CM | POA: Diagnosis not present

## 2015-06-28 DIAGNOSIS — N189 Chronic kidney disease, unspecified: Secondary | ICD-10-CM | POA: Diagnosis not present

## 2015-06-28 DIAGNOSIS — E119 Type 2 diabetes mellitus without complications: Secondary | ICD-10-CM | POA: Diagnosis not present

## 2015-06-28 DIAGNOSIS — I10 Essential (primary) hypertension: Secondary | ICD-10-CM

## 2015-06-28 DIAGNOSIS — R45851 Suicidal ideations: Secondary | ICD-10-CM

## 2015-06-28 DIAGNOSIS — F319 Bipolar disorder, unspecified: Secondary | ICD-10-CM | POA: Insufficient documentation

## 2015-06-28 DIAGNOSIS — Z79899 Other long term (current) drug therapy: Secondary | ICD-10-CM | POA: Diagnosis not present

## 2015-06-28 DIAGNOSIS — F32A Depression, unspecified: Secondary | ICD-10-CM

## 2015-06-28 DIAGNOSIS — I129 Hypertensive chronic kidney disease with stage 1 through stage 4 chronic kidney disease, or unspecified chronic kidney disease: Secondary | ICD-10-CM | POA: Diagnosis not present

## 2015-06-28 DIAGNOSIS — F329 Major depressive disorder, single episode, unspecified: Secondary | ICD-10-CM | POA: Diagnosis not present

## 2015-06-28 DIAGNOSIS — Z7982 Long term (current) use of aspirin: Secondary | ICD-10-CM | POA: Diagnosis not present

## 2015-06-28 HISTORY — DX: Bipolar disorder, unspecified: F31.9

## 2015-06-28 LAB — ACETAMINOPHEN LEVEL: Acetaminophen (Tylenol), Serum: <10 ug/mL — ABNORMAL LOW (ref 10–30)

## 2015-06-28 LAB — CBC
HCT: 45.2 % (ref 39.0–52.0)
Hemoglobin: 16 g/dL (ref 13.0–17.0)
MCH: 31.3 pg (ref 26.0–34.0)
MCHC: 35.4 g/dL (ref 30.0–36.0)
MCV: 88.5 fL (ref 78.0–100.0)
Platelets: 131 10*3/uL — ABNORMAL LOW (ref 150–400)
RBC: 5.11 MIL/uL (ref 4.22–5.81)
RDW: 12.2 % (ref 11.5–15.5)
WBC: 4.2 10*3/uL (ref 4.0–10.5)

## 2015-06-28 LAB — RAPID URINE DRUG SCREEN, HOSP PERFORMED
Amphetamines: NOT DETECTED
Barbiturates: NOT DETECTED
Benzodiazepines: NOT DETECTED
Cocaine: NOT DETECTED
Opiates: NOT DETECTED
Tetrahydrocannabinol: NOT DETECTED

## 2015-06-28 LAB — COMPREHENSIVE METABOLIC PANEL
ALT: 84 U/L — ABNORMAL HIGH (ref 17–63)
AST: 45 U/L — ABNORMAL HIGH (ref 15–41)
Albumin: 4 g/dL (ref 3.5–5.0)
Alkaline Phosphatase: 92 U/L (ref 38–126)
Anion gap: 12 (ref 5–15)
BUN: 13 mg/dL (ref 6–20)
CO2: 29 mmol/L (ref 22–32)
Calcium: 9.7 mg/dL (ref 8.9–10.3)
Chloride: 95 mmol/L — ABNORMAL LOW (ref 101–111)
Creatinine, Ser: 1.56 mg/dL — ABNORMAL HIGH (ref 0.61–1.24)
GFR calc Af Amer: 54 mL/min — ABNORMAL LOW (ref >60)
GFR calc non Af Amer: 46 mL/min — ABNORMAL LOW (ref >60)
Glucose, Bld: 165 mg/dL — ABNORMAL HIGH (ref 65–99)
Potassium: 4.5 mmol/L (ref 3.5–5.1)
Sodium: 136 mmol/L (ref 135–145)
Total Bilirubin: 0.9 mg/dL (ref 0.3–1.2)
Total Protein: 7.2 g/dL (ref 6.5–8.1)

## 2015-06-28 LAB — SALICYLATE LEVEL: Salicylate Lvl: <4 mg/dL (ref 2.8–30.0)

## 2015-06-28 LAB — ETHANOL: Alcohol, Ethyl (B): 6 mg/dL — ABNORMAL HIGH (ref <5)

## 2015-06-28 MED ORDER — FUROSEMIDE 20 MG PO TABS
20.0000 mg | ORAL_TABLET | Freq: Every day | ORAL | Status: DC
  Administered 2015-06-29 (×2): 20 mg via ORAL
  Filled 2015-06-28 (×2): qty 1

## 2015-06-28 MED ORDER — IBUPROFEN 200 MG PO TABS: 600.0000 mg | ORAL_TABLET | Freq: Three times a day (TID) | ORAL | Status: DC | PRN

## 2015-06-28 MED ORDER — ALLOPURINOL 100 MG PO TABS
300.0000 mg | ORAL_TABLET | Freq: Every day | ORAL | Status: DC
  Administered 2015-06-29: 300 mg via ORAL
  Filled 2015-06-28: qty 3

## 2015-06-28 MED ORDER — ASPIRIN EC 81 MG PO TBEC
81.0000 mg | DELAYED_RELEASE_TABLET | Freq: Every day | ORAL | Status: DC
Start: 2015-06-29 — End: 2015-06-30
  Administered 2015-06-29: 81 mg via ORAL
  Filled 2015-06-28: qty 1

## 2015-06-28 MED ORDER — COLCHICINE 0.6 MG PO TABS
0.6000 mg | ORAL_TABLET | Freq: Every day | ORAL | Status: DC
  Administered 2015-06-29: 0.6 mg via ORAL
  Filled 2015-06-28: qty 1

## 2015-06-28 MED ORDER — QUETIAPINE FUMARATE 200 MG PO TABS
400.0000 mg | ORAL_TABLET | Freq: Every day | ORAL | Status: DC
  Administered 2015-06-29 (×2): 400 mg via ORAL
  Filled 2015-06-28 (×2): qty 2

## 2015-06-28 MED ORDER — METOPROLOL SUCCINATE ER 25 MG PO TB24
200.0000 mg | ORAL_TABLET | Freq: Every day | ORAL | Status: DC
  Administered 2015-06-29: 200 mg via ORAL
  Filled 2015-06-28: qty 8

## 2015-06-28 MED ORDER — GLIPIZIDE ER 5 MG PO TB24
5.0000 mg | ORAL_TABLET | Freq: Every day | ORAL | Status: DC
  Administered 2015-06-29: 5 mg via ORAL
  Filled 2015-06-28 (×5): qty 1

## 2015-06-28 MED ORDER — LAMOTRIGINE 100 MG PO TABS
100.0000 mg | ORAL_TABLET | Freq: Every day | ORAL | Status: DC
  Administered 2015-06-29 (×2): 100 mg via ORAL
  Filled 2015-06-28 (×2): qty 1

## 2015-06-28 MED ORDER — TRAZODONE HCL 100 MG PO TABS
200.0000 mg | ORAL_TABLET | Freq: Every day | ORAL | Status: DC
  Administered 2015-06-29 (×2): 200 mg via ORAL
  Filled 2015-06-28 (×2): qty 2

## 2015-06-28 MED ORDER — AMLODIPINE BESYLATE 5 MG PO TABS
10.0000 mg | ORAL_TABLET | Freq: Every day | ORAL | Status: DC
Start: 2015-06-28 — End: 2015-06-30
  Administered 2015-06-29 (×2): 10 mg via ORAL
  Filled 2015-06-28 (×2): qty 2

## 2015-06-28 MED ORDER — NICOTINE 21 MG/24HR TD PT24: 21.0000 mg | MEDICATED_PATCH | Freq: Every day | TRANSDERMAL | Status: DC

## 2015-06-28 MED ORDER — ZOLPIDEM TARTRATE 5 MG PO TABS
5.0000 mg | ORAL_TABLET | Freq: Every evening | ORAL | Status: DC | PRN
  Administered 2015-06-29: 5 mg via ORAL
  Filled 2015-06-28: qty 1

## 2015-06-28 MED ORDER — ALUM & MAG HYDROXIDE-SIMETH 200-200-20 MG/5ML PO SUSP: 30.0000 mL | ORAL | Status: DC | PRN

## 2015-06-28 MED ORDER — CHOLECALCIFEROL 10 MCG (400 UNIT) PO TABS
800.0000 [IU] | ORAL_TABLET | Freq: Every day | ORAL | Status: DC
  Administered 2015-06-29: 800 [IU] via ORAL
  Filled 2015-06-28 (×2): qty 2

## 2015-06-28 MED ORDER — CLONAZEPAM 0.5 MG PO TABS
0.2500 mg | ORAL_TABLET | Freq: Two times a day (BID) | ORAL | Status: DC
  Administered 2015-06-29 (×3): 0.5 mg via ORAL
  Filled 2015-06-28 (×3): qty 1

## 2015-06-28 MED ORDER — ATORVASTATIN CALCIUM 10 MG PO TABS
10.0000 mg | ORAL_TABLET | Freq: Every day | ORAL | Status: DC
  Administered 2015-06-29 (×2): 10 mg via ORAL
  Filled 2015-06-28 (×3): qty 1

## 2015-06-28 MED ORDER — ONDANSETRON HCL 4 MG PO TABS: 4.0000 mg | ORAL_TABLET | Freq: Three times a day (TID) | ORAL | Status: DC | PRN

## 2015-06-28 MED ORDER — LORAZEPAM 1 MG PO TABS: 1.0000 mg | ORAL_TABLET | Freq: Three times a day (TID) | ORAL | Status: DC | PRN
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
## 2015-06-28 NOTE — BH Assessment (Signed)
Received notification of TTS consult request. Spoke to [facility], PA-C who said Pt has a long history of depression and tried to kill himself by hanging today. Tele-assessment will be initiated.  Harlin Rain Patsy [place], LPC, [facility], [facility] Triage Specialist [phone]

## 2015-06-28 NOTE — ED Notes (Signed)
Pt here for SI, states he attempted to hang himself and wife came home and he stopped. Pt reports hx of bipolar and previous attempts, pt has very flat affect .

## 2015-06-29 ENCOUNTER — Encounter (HOSPITAL_COMMUNITY): Payer: Self-pay | Admitting: *Deleted

## 2015-06-29 DIAGNOSIS — Z79899 Other long term (current) drug therapy: Secondary | ICD-10-CM | POA: Diagnosis not present

## 2015-06-29 DIAGNOSIS — M109 Gout, unspecified: Secondary | ICD-10-CM | POA: Diagnosis not present

## 2015-06-29 DIAGNOSIS — E119 Type 2 diabetes mellitus without complications: Secondary | ICD-10-CM | POA: Diagnosis not present

## 2015-06-29 DIAGNOSIS — I129 Hypertensive chronic kidney disease with stage 1 through stage 4 chronic kidney disease, or unspecified chronic kidney disease: Secondary | ICD-10-CM | POA: Diagnosis not present

## 2015-06-29 DIAGNOSIS — N189 Chronic kidney disease, unspecified: Secondary | ICD-10-CM | POA: Diagnosis not present

## 2015-06-29 DIAGNOSIS — F319 Bipolar disorder, unspecified: Secondary | ICD-10-CM | POA: Diagnosis not present

## 2015-06-29 DIAGNOSIS — Z7982 Long term (current) use of aspirin: Secondary | ICD-10-CM | POA: Diagnosis not present

## 2015-06-29 LAB — CBG MONITORING, ED: Glucose-Capillary: 208 mg/dL — ABNORMAL HIGH (ref 65–99)

## 2015-06-29 NOTE — ED Notes (Signed)
Sitter at patient bedside, within arms reach. Patient currently resting comfortably at this time

## 2015-06-29 NOTE — BH Assessment (Addendum)
Tele Assessment Note   Christian [name] is an 64 y.o. male, married, black male who presents unaccompanied to [facility] ED after attempting to hang himself. Pt has a history of bipolar disorder and states he has been increasingly depressed for the past two weeks since his sister was hospitalized. He cannot identify and precipitant to his suicide attempt and says "I just felt depressed." Pt reports he has attempted suicide in the past including [place] with the intent of drowning. Pt reports symptoms including social withdrawal, loss of interest in usual pleasures, decreased concentration, decreased appetite with weight loss and feelings of guilt and hopelessness. Pt denies current homicidal ideation or history of violence. Pt denies current auditory or visual hallucinations and any history of psychotic symptoms. Pt denies alcohol or substance use.  Pt identifies financial stress and family conflicts as his primary stressor. He states he lives with his wife and has one adult daughter. He states he is on disability due to mental health problems. Pt reports he was diagnosed with depression at age 41. Pt states he is currently receiving outpatient medication management with Dr. Emerson Monte and states he is compliant with medications (see MAR). He reports several previous inpatient psychiatric hospitalizations including [facility], [facility] and [facility] He reports his last hospitalization was at [facility] in July 2015. He states he was a history of receiving ECT in 2015. Pt denies any current legal problems.  Pt is dressed in hospital scrubs, alert, oriented x4 with normal speech and normal motor behavior. Eye contact is good. Pt's mood is depressed and affect is Flat. Thought process is coherent and relevant. There is no indication Pt is currently responding to internal stimuli or experiencing delusional thought content. Pt was calm and cooperative throughout  assessment. He states he is willing to sign voluntarily into a psychiatric facility.    Axis I: Bipolar I Disorder, Current Epsiode Depressed, Severe Without Psychotic Features Axis II: Deferred Axis III:  Past Medical History  Diagnosis Date  . Diabetes mellitus without complication   . Hypertension   . Gout   . Bipolar 1 disorder    Axis IV: economic problems and other psychosocial or environmental problems Axis V: GAF=30  Past Medical History:  Past Medical History  Diagnosis Date  . Diabetes mellitus without complication   . Hypertension   . Gout   . Bipolar 1 disorder     History reviewed. No pertinent past surgical history.  Family History: History reviewed. No pertinent family history.  Social History:  has no tobacco, alcohol, and drug history on file.  Additional Social History:  Alcohol / Drug Use Pain Medications: Denies abuse Prescriptions: See MAR Over the Counter: See MAR History of alcohol / drug use?: No history of alcohol / drug abuse Longest period of sobriety (when/how long): NA  CIWA: CIWA-Ar BP: (!) 181/106 mmHg Pulse Rate: 82 COWS:    PATIENT STRENGTHS: (choose at least two) Ability for insight Average or above average intelligence Capable of independent living Metallurgist fund of knowledge Motivation for treatment/growth Physical Health Supportive family/friends  Allergies: Not on File  Home Medications:  (Not in a hospital admission)  OB/GYN Status:  No LMP for male patient.  General Assessment Data Location of Assessment: [facility] ED TTS Assessment: In system Is this a Tele or Face-to-Face Assessment?: Tele Assessment Is this an Initial Assessment or a Re-assessment for this encounter?: Initial Assessment Marital status: Married  Maiden name: NA Is patient pregnant?: No Pregnancy Status: No Living Arrangements: Spouse/significant other Can pt return to current living arrangement?: Yes Admission  Status: Voluntary Is patient capable of signing voluntary admission?: Yes Referral Source: Self/Family/Friend Insurance type: Medicare     Crisis Care Plan Living Arrangements: Spouse/significant other Name of Psychiatrist: Emerson Monte, MD Name of Therapist: None  Education Status Is patient currently in school?: No Current Grade: NA Highest grade of school patient has completed: Four years college Name of school: NA Contact person: NA  Risk to self with the past 6 months Suicidal Ideation: Yes-Currently Present Has patient been a risk to self within the past 6 months prior to admission? : Yes Suicidal Intent: Yes-Currently Present Has patient had any suicidal intent within the past 6 months prior to admission? : Yes Is patient at risk for suicide?: Yes Suicidal Plan?: Yes-Currently Present Has patient had any suicidal plan within the past 6 months prior to admission? : Yes Specify Current Suicidal Plan: Sherri Rad himself Access to Means: Yes Specify Access to Suicidal Means: Pt was attempting to hang himself today What has been your use of drugs/alcohol within the last 12 months?: Pt denies Previous Attempts/Gestures: Yes How many times?: 1 Other Self Harm Risks: None identified Triggers for Past Attempts: Unknown Intentional Self Injurious Behavior: None Family Suicide History: No Recent stressful life event(s): Financial Problems Persecutory voices/beliefs?: No Depression: Yes Depression Symptoms: Despondent, Isolating, Guilt, Loss of interest in usual pleasures, Feeling worthless/self pity Substance abuse history and/or treatment for substance abuse?: No Suicide prevention information given to non-admitted patients: Not applicable  Risk to Others within the past 6 months Homicidal Ideation: No Does patient have any lifetime risk of violence toward others beyond the six months prior to admission? : No Thoughts of Harm to Others: No Current Homicidal Intent:  No Current Homicidal Plan: No Access to Homicidal Means: No Identified Victim: None History of harm to others?: No Assessment of Violence: None Noted Violent Behavior Description: Pt denies history of violence Does patient have access to weapons?: No Criminal Charges Pending?: No Does patient have a court date: No Is patient on probation?: No  Psychosis Hallucinations: None noted Delusions: None noted  Mental Status Report Appearance/Hygiene: In hospital gown Eye Contact: Good Motor Activity: Unremarkable Speech: Logical/coherent Level of Consciousness: Alert Mood: Depressed Affect: Flat Anxiety Level: Minimal Thought Processes: Coherent, Relevant Judgement: Partial Orientation: Person, Place, Time, Situation, Appropriate for developmental age Obsessive Compulsive Thoughts/Behaviors: None  Cognitive Functioning Concentration: Decreased Memory: Recent Intact, Remote Intact IQ: Average Insight: Fair Impulse Control: Fair Appetite: Poor Weight Loss: 50 Weight Gain: 0 Sleep: No Change Total Hours of Sleep: 8 Vegetative Symptoms: None  ADLScreening [facility] Assessment Services) Patient's cognitive ability adequate to safely complete daily activities?: Yes Patient able to express need for assistance with ADLs?: Yes Independently performs ADLs?: Yes (appropriate for developmental age)  Prior Inpatient Therapy Prior Inpatient Therapy: Yes Prior Therapy Dates: 05/2014; multiple admits Prior Therapy Facilty/Provider(s): [facility], [facility], [facility] Reason for Treatment: Bipolar disorder  Prior Outpatient Therapy Prior Outpatient Therapy: Yes Prior Therapy Dates: Current Prior Therapy Facilty/Provider(s): Emerson Monte, MD Reason for Treatment: Bipolar disorder Does patient have an ACCT team?: No Does patient have Intensive In-House Services?  : No Does patient have [facility] services? : No Does patient have P4CC services?: No  ADL Screening (condition at time of  admission) Patient's cognitive ability adequate to safely complete daily activities?: Yes Patient able to express need for assistance with ADLs?: Yes Independently  performs ADLs?: Yes (appropriate for developmental age)       Abuse/Neglect Assessment (Assessment to be complete while patient is alone) Physical Abuse: Denies Verbal Abuse: Denies Sexual Abuse: Denies Exploitation of patient/patient's resources: Denies Self-Neglect: Denies     Merchant navy officer (For Healthcare) Does patient have an advance directive?: No Would patient like information on creating an advanced directive?: No - patient declined information    Additional Information 1:1 In Past 12 Months?: No CIRT Risk: No Elopement Risk: No Does patient have medical clearance?: Yes     Disposition: Binnie Rail, AC at [facility], confirms adult unit is currently at capacity. Gave clinical report to Maryjean Morn, PA who said Pt meets criteria for inpatient psychiatric treatment. TTS will contact other facilities for placement. Notified Erin, PA-C and Pt's RN of recommendation.  Disposition Initial Assessment Completed for this Encounter: Yes Disposition of Patient: Inpatient treatment program Type of inpatient treatment program: Adult   Pamalee Leyden, [facility], [facility], [facility] Triage Specialist [phone]   Pamalee Leyden 06/29/2015 12:06 AM

## 2015-06-29 NOTE — BHH Counselor (Signed)
Faxed clinical information to the following facilities:  [facility]imm[facility]   [address] [place], [place], [facility], [facility] Triage Specialist [phone]

## 2015-06-29 NOTE — Progress Notes (Signed)
Disposition CSW resubmitted referrals to the following inpatient psych facilities:  [facility]  CSW will continue to follow patient for placement needs.  S[facility] Spe[facility], [facility] Disposition CSW [phone]

## 2015-06-29 NOTE — ED Notes (Signed)
Spouse advised she will bring pt's CPAP machine either tonight or in the morning.

## 2015-06-29 NOTE — BHH Counselor (Signed)
TC from [place] at [facility]. They have added pt to their wait list.  Evette Cristal, [facility] Therapeutic Triage Specialist

## 2015-06-30 ENCOUNTER — Encounter (HOSPITAL_COMMUNITY): Payer: Self-pay | Admitting: Internal Medicine

## 2015-06-30 DIAGNOSIS — F315 Bipolar disorder, current episode depressed, severe, with psychotic features: Secondary | ICD-10-CM | POA: Diagnosis not present

## 2015-06-30 DIAGNOSIS — F310 Bipolar disorder, current episode hypomanic: Secondary | ICD-10-CM | POA: Diagnosis not present

## 2015-06-30 DIAGNOSIS — G4733 Obstructive sleep apnea (adult) (pediatric): Secondary | ICD-10-CM | POA: Diagnosis not present

## 2015-06-30 DIAGNOSIS — R45851 Suicidal ideations: Secondary | ICD-10-CM | POA: Diagnosis not present

## 2015-06-30 DIAGNOSIS — I252 Old myocardial infarction: Secondary | ICD-10-CM | POA: Diagnosis not present

## 2015-06-30 DIAGNOSIS — N179 Acute kidney failure, unspecified: Secondary | ICD-10-CM | POA: Diagnosis not present

## 2015-06-30 DIAGNOSIS — M109 Gout, unspecified: Secondary | ICD-10-CM | POA: Diagnosis not present

## 2015-06-30 DIAGNOSIS — F329 Major depressive disorder, single episode, unspecified: Secondary | ICD-10-CM | POA: Diagnosis not present

## 2015-06-30 DIAGNOSIS — N183 Chronic kidney disease, stage 3 (moderate): Secondary | ICD-10-CM | POA: Diagnosis not present

## 2015-06-30 DIAGNOSIS — N390 Urinary tract infection, site not specified: Secondary | ICD-10-CM | POA: Diagnosis not present

## 2015-06-30 DIAGNOSIS — F508 Other eating disorders: Secondary | ICD-10-CM | POA: Diagnosis not present

## 2015-06-30 DIAGNOSIS — I10 Essential (primary) hypertension: Secondary | ICD-10-CM | POA: Diagnosis not present

## 2015-06-30 DIAGNOSIS — I509 Heart failure, unspecified: Secondary | ICD-10-CM | POA: Diagnosis not present

## 2015-06-30 DIAGNOSIS — R4182 Altered mental status, unspecified: Secondary | ICD-10-CM | POA: Diagnosis not present

## 2015-06-30 DIAGNOSIS — I444 Left anterior fascicular block: Secondary | ICD-10-CM | POA: Diagnosis not present

## 2015-06-30 DIAGNOSIS — R627 Adult failure to thrive: Secondary | ICD-10-CM | POA: Diagnosis not present

## 2015-06-30 DIAGNOSIS — R000 Tachycardia, unspecified: Secondary | ICD-10-CM | POA: Diagnosis not present

## 2015-06-30 DIAGNOSIS — F3181 Bipolar II disorder: Secondary | ICD-10-CM | POA: Diagnosis not present

## 2015-06-30 DIAGNOSIS — F22 Delusional disorders: Secondary | ICD-10-CM | POA: Diagnosis not present

## 2015-06-30 DIAGNOSIS — I129 Hypertensive chronic kidney disease with stage 1 through stage 4 chronic kidney disease, or unspecified chronic kidney disease: Secondary | ICD-10-CM | POA: Diagnosis not present

## 2015-06-30 DIAGNOSIS — E119 Type 2 diabetes mellitus without complications: Secondary | ICD-10-CM | POA: Diagnosis not present

## 2015-06-30 NOTE — ED Notes (Signed)
Provided with supplies to shower.

## 2015-06-30 NOTE — ED Notes (Signed)
Spoke with [facility] regarding transfer of patient and attempted report.  States we will not give a bed or physician until the Cpap actually arrives to the ED.

## 2015-06-30 NOTE — ED Notes (Signed)
Spoke with wife Gloriajean Dell.  To bring Cpap.

## 2015-06-30 NOTE — ED Notes (Signed)
Attempted to call wife to request Cpap machine.  No answer received.  To continue to try.

## 2015-06-30 NOTE — BH Assessment (Signed)
Received call from Christiane Ha at [facility] who said Pt has been accepted under condition that Pt have CPAP machine with him before he is transported to their facility. Per ED note, Pt's spouse is to deliver CPAP this morning. [facility] Les Pou at [facility] has accepted Pt and the number for nursing report is [phone]. Notified Dr. Azalia Bilis and Selena Lesser, RN of plan.  Harlin Rain Patsy Baltimore, LPC, [facility], [facility] Triage Specialist [phone]

## 2015-06-30 NOTE — ED Notes (Signed)
Pt belongings brought by wife sent with pelham driver including clothing and CPAP machine. Belongings have pt label on them.

## 2015-06-30 NOTE — ED Notes (Signed)
Pt safely departing ED with Pel[employer] Driver, voluntary. Calm and cooperative. Ambulatory with steady gait. VSS. A/O x4.

## 2015-06-30 NOTE — ED Notes (Addendum)
Patient has been accepted to [facility] if the family can provide cpap machine prior to him leaving.  To call family this morning to inquire about this.

## 2015-07-08 ENCOUNTER — Encounter: Payer: Self-pay | Admitting: Occupational Medicine

## 2015-07-08 ENCOUNTER — Emergency Department (EMERGENCY_DEPARTMENT_HOSPITAL)
Admission: EM | Admit: 2015-07-08 | Discharge: 2015-07-09 | Disposition: A | Discharge status: Other Acute Inpatient Hospital | Payer: Commercial Managed Care - HMO | Source: Home / Self Care | Attending: Emergency Medicine | Admitting: Emergency Medicine

## 2015-07-08 DIAGNOSIS — I252 Old myocardial infarction: Secondary | ICD-10-CM | POA: Diagnosis not present

## 2015-07-08 DIAGNOSIS — F315 Bipolar disorder, current episode depressed, severe, with psychotic features: Secondary | ICD-10-CM | POA: Diagnosis not present

## 2015-07-08 DIAGNOSIS — F22 Delusional disorders: Secondary | ICD-10-CM | POA: Insufficient documentation

## 2015-07-08 DIAGNOSIS — E118 Type 2 diabetes mellitus with unspecified complications: Secondary | ICD-10-CM

## 2015-07-08 DIAGNOSIS — Z79899 Other long term (current) drug therapy: Secondary | ICD-10-CM

## 2015-07-08 DIAGNOSIS — Z7982 Long term (current) use of aspirin: Secondary | ICD-10-CM | POA: Insufficient documentation

## 2015-07-08 DIAGNOSIS — N183 Chronic kidney disease, stage 3 (moderate): Secondary | ICD-10-CM | POA: Insufficient documentation

## 2015-07-08 DIAGNOSIS — F3110 Bipolar disorder, current episode manic without psychotic features, unspecified: Secondary | ICD-10-CM

## 2015-07-08 DIAGNOSIS — R627 Adult failure to thrive: Secondary | ICD-10-CM | POA: Diagnosis not present

## 2015-07-08 DIAGNOSIS — E119 Type 2 diabetes mellitus without complications: Secondary | ICD-10-CM

## 2015-07-08 DIAGNOSIS — F310 Bipolar disorder, current episode hypomanic: Secondary | ICD-10-CM | POA: Diagnosis not present

## 2015-07-08 DIAGNOSIS — Z820 Family history of epilepsy and other diseases of the nervous system: Secondary | ICD-10-CM | POA: Diagnosis not present

## 2015-07-08 DIAGNOSIS — R4182 Altered mental status, unspecified: Secondary | ICD-10-CM | POA: Diagnosis not present

## 2015-07-08 DIAGNOSIS — F508 Other eating disorders: Secondary | ICD-10-CM | POA: Diagnosis not present

## 2015-07-08 DIAGNOSIS — Z809 Family history of malignant neoplasm, unspecified: Secondary | ICD-10-CM | POA: Diagnosis not present

## 2015-07-08 DIAGNOSIS — F1310 Sedative, hypnotic or anxiolytic abuse, uncomplicated: Secondary | ICD-10-CM | POA: Insufficient documentation

## 2015-07-08 DIAGNOSIS — M109 Gout, unspecified: Secondary | ICD-10-CM | POA: Diagnosis present

## 2015-07-08 DIAGNOSIS — I509 Heart failure, unspecified: Secondary | ICD-10-CM | POA: Diagnosis not present

## 2015-07-08 DIAGNOSIS — E11649 Type 2 diabetes mellitus with hypoglycemia without coma: Secondary | ICD-10-CM | POA: Diagnosis not present

## 2015-07-08 DIAGNOSIS — I129 Hypertensive chronic kidney disease with stage 1 through stage 4 chronic kidney disease, or unspecified chronic kidney disease: Secondary | ICD-10-CM

## 2015-07-08 DIAGNOSIS — G4733 Obstructive sleep apnea (adult) (pediatric): Secondary | ICD-10-CM | POA: Diagnosis not present

## 2015-07-08 DIAGNOSIS — I444 Left anterior fascicular block: Secondary | ICD-10-CM | POA: Diagnosis not present

## 2015-07-08 DIAGNOSIS — R000 Tachycardia, unspecified: Secondary | ICD-10-CM | POA: Diagnosis not present

## 2015-07-08 DIAGNOSIS — F329 Major depressive disorder, single episode, unspecified: Secondary | ICD-10-CM | POA: Diagnosis not present

## 2015-07-08 DIAGNOSIS — N179 Acute kidney failure, unspecified: Secondary | ICD-10-CM | POA: Diagnosis not present

## 2015-07-08 DIAGNOSIS — I10 Essential (primary) hypertension: Secondary | ICD-10-CM | POA: Diagnosis not present

## 2015-07-08 DIAGNOSIS — N390 Urinary tract infection, site not specified: Secondary | ICD-10-CM | POA: Diagnosis not present

## 2015-07-08 DIAGNOSIS — R45851 Suicidal ideations: Secondary | ICD-10-CM | POA: Diagnosis not present

## 2015-07-08 MED ORDER — LORAZEPAM 2 MG/ML IJ SOLN
INTRAMUSCULAR | Status: AC
  Administered 2015-07-08: 2 mg via INTRAMUSCULAR
  Filled 2015-07-08: qty 1

## 2015-07-08 MED ORDER — HALOPERIDOL LACTATE 5 MG/ML IJ SOLN
5.0000 mg | Freq: Once | INTRAMUSCULAR | Status: AC
  Administered 2015-07-08: 5 mg via INTRAMUSCULAR

## 2015-07-08 MED ORDER — LORAZEPAM 2 MG/ML IJ SOLN
2.0000 mg | Freq: Once | INTRAMUSCULAR | Status: AC
  Administered 2015-07-08: 2 mg via INTRAMUSCULAR

## 2015-07-08 MED ORDER — HALOPERIDOL LACTATE 5 MG/ML IJ SOLN
INTRAMUSCULAR | Status: AC
  Administered 2015-07-08: 5 mg via INTRAMUSCULAR
  Filled 2015-07-08: qty 1

## 2015-07-08 NOTE — ED Notes (Signed)
Called RT for apnea monitor

## 2015-07-08 NOTE — ED Provider Notes (Signed)
[facility] Emergency Department Provider Note  ____________________________________________  Time seen: Approximately 9:39 PM  I have reviewed the triage vital signs and the nursing notes.  Patient with the patient is very aggressive on arrival, pushing and acting out towards his wife. Also acting very paranoid and agitated towards police. His wife states that he is having hallucinations and paranoia the police are after him. Because he was so agitated, standing up in the room taking aggressive stances and trying to push at staff have had to medicate him with IM Haldol and Ativan for safety and sedation.   HISTORY  Chief Complaint  Aggressive behavior  EM caveat: Patient refuses to answer questions. History is per his family.   HPI Christian [name] is a 64 y.o. male history of CK D, hypertension, and bipolar disorder. He presents today and per family states that he was recently admitted to psychiatric facility for a psychiatric break, he was discharged this morning but his family felt that he was not ready to leave. This evening he was being aggressive and agitated towards his wife, who is brought him back to the emergency room for evaluation for concerns of ongoing psychiatric issues.  He has not had evidently any recent illnesses, has not been coughing, having fevers or acting sick. He has however been very agitated, easily erupts at his family and wife.  Has a history of depression, presents to the [facility], also has a history of manic.   Past Medical History  Diagnosis Date  . Mood swings   . Type II or unspecified type diabetes mellitus with unspecified complication, uncontrolled 06/02/2014  . Other and unspecified hyperlipidemia 06/02/2014  . OSA on CPAP 06/02/2014  . 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 Bipolar disorder   . CHF (congestive heart failure)   . CKD (chronic kidney disease), stage  III   . Diabetes mellitus without complication   . Hypertension   . Gout   . Bipolar 1 disorder   . OSA on CPAP     Patient Active Problem List   Diagnosis Date Noted  . Bipolar affective, manic 06/21/2014  . UTI (urinary tract infection) 06/19/2014  . Positive blood culture 06/19/2014  . Bipolar affective disorder, current episode manic with psychotic symptoms 06/17/2014  . Type II or unspecified type diabetes mellitus with unspecified complication, uncontrolled 06/02/2014  . Other and unspecified hyperlipidemia 06/02/2014  . OSA on CPAP 06/02/2014  . 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 Gout 06/02/2014  . Hypertension     No past surgical history on file.  Current Outpatient Rx  Name  Route  Sig  Dispense  Refill  . lurasidone (LATUDA) 40 MG TABS tablet   Oral   Take 40 mg by mouth every evening.         . mirtazapine (REMERON) 30 MG tablet   Oral   Take 30 mg by mouth at bedtime.         Marland Kitchen allopurinol (ZYLOPRIM) 300 MG tablet   Oral   Take 1 tablet (300 mg total) by mouth daily. For gout         . allopurinol (ZYLOPRIM) 300 MG tablet   Oral   Take 300 mg by mouth daily.         Marland Kitchen amLODipine (NORVASC) 10 MG tablet   Oral   Take 10 mg by mouth daily.         Marland Kitchen  ARIPiprazole (ABILIFY) 15 MG tablet   Oral   Take 1 tablet (15 mg total) by mouth 2 (two) times daily after a meal.   60 tablet   0   . aspirin EC 81 MG tablet   Oral   Take 81 mg by mouth daily.         Marland Kitchen atorvastatin (LIPITOR) 10 MG tablet   Oral   Take 1 tablet (10 mg total) by mouth daily. For high cholesterol         . atorvastatin (LIPITOR) 10 MG tablet   Oral   Take 10 mg by mouth daily at 6 PM.          . benztropine (COGENTIN) 1 MG tablet   Oral   Take 1 tablet (1 mg total) by mouth 2 (two) times daily.   60 tablet   0   . cholecalciferol (VITAMIN D) 400 UNITS TABS tablet   Oral   Take 800 Units by mouth daily.          . clonazePAM (KLONOPIN) 0.5 MG tablet   Oral   Take 0.5 mg by mouth 2 (two) times daily.          . colchicine 0.6 MG tablet   Oral   Take 1 tablet (0.6 mg total) by mouth 2 (two) times daily. For Gout pain         . colchicine 0.6 MG tablet   Oral   Take 0.6 mg by mouth daily.         . furosemide (LASIX) 20 MG tablet   Oral   Take 20 mg by mouth daily.         . furosemide (LASIX) 80 MG tablet   Oral   Take 1 tablet (80 mg total) by mouth 2 (two) times daily.   60 tablet   0   . glipiZIDE (GLUCOTROL XL) 5 MG 24 hr tablet   Oral   Take 5 mg by mouth daily.         Marland Kitchen glyBURIDE (DIABETA) 5 MG tablet   Oral   Take 1 tablet (5 mg total) by mouth daily with breakfast.   30 tablet   0   . hydrALAZINE (APRESOLINE) 50 MG tablet   Oral   Take 1 tablet (50 mg total) by mouth every 6 (six) hours.   120 tablet   0   . lamoTRIgine (LAMICTAL) 100 MG tablet   Oral   Take 1 tablet (100 mg total) by mouth every evening.   30 tablet   0   . LORazepam (ATIVAN) 1 MG tablet   Oral   Take 1 tablet (1 mg total) by mouth 3 (three) times daily after meals.   90 tablet   0   . metoprolol (TOPROL-XL) 200 MG 24 hr tablet   Oral   Take 1 tablet (200 mg total) by mouth daily. For high blood pressure         . metoprolol succinate (TOPROL-XL) 100 MG 24 hr tablet   Oral   Take 200 mg by mouth daily.          . QUEtiapine (SEROQUEL) 400 MG tablet   Oral   Take 400 mg by mouth at bedtime.          . traZODone (DESYREL) 100 MG tablet   Oral   Take 2 tablets (200 mg total) by mouth at bedtime. For sleep   30 tablet   0  Allergies Review of patient's allergies indicates no known allergies.  Family History  Problem Relation Age of Onset  . Dementia Mother   . Arthritis Father   . Heart failure Neg Hx   . Kidney failure Neg Hx   . Cancer Neg Hx     Social History History  Substance Use Topics  . Smoking status: Not on file  . Smokeless tobacco:  Not on file  . Alcohol Use: No    Review of Systems Constitutional: No fever/chills No recent illness, except from discharge from [facility] today. Review of systems is limited as the patient refuses to answer any questions. ____________________________________________  Time initially delayed, patient is ambulatory agitated and aggressive. For staff safety reasons to delay vital signs and the patient is calm. PHYSICAL EXAM:  Please note my initial exam is limited because of patient agitation and aggressive nature, I do not feel comfortable providing him to perform a detailed exam at this time.  11:15 PM I performed reevaluation the patient is resting. He is currently resting comfortably in his bed, with stable vital signs. He is on continuous pulse oximetry due to his sleep apnea and sedative.  Constitutional: Alert and standing in room, pushing towards his wife saying "Nadine" and is aggressive stance toward staff. Eyes: Conjunctivae are normal. EOMI. Head: Atraumatic. Mouth/Throat: Mucous membranes are moist.   Neck: No stridor.   Cardiovascular: Deferred until patient is able to calm  Respiratory: Normal respiratory effort.  No retractions. No audible wheezing. Gastrointestinal: Soft and nontender. No distention. No abdominal bruits. No CVA tenderness. Musculoskeletal: No lower extremity tenderness nor edema.  No joint effusions. Neurologic:  Normal speech and language. No gross focal neurologic deficits are appreciated. No gait instability. Skin:  Skin is warm, dry and intact. No rash noted. Psychiatric: Mood and affect are withdrawn and flat. Behavior is withdrawn and flat.  ____________________________________________   LABS (all labs ordered are listed, but only abnormal results are displayed)  Labs Reviewed  CBC WITH DIFFERENTIAL/PLATELET  COMPREHENSIVE METABOLIC PANEL  ETHANOL  URINE DRUG SCREEN, QUALITATIVE ([facility] ONLY)  SALICYLATE LEVEL  ACETAMINOPHEN LEVEL    ____________________________________________  EKG    ____________________________________________  RADIOLOGY   ____________________________________________   PROCEDURES  Procedure(s) performed: None  Critical Care performed: No  ____________________________________________   INITIAL IMPRESSION / ASSESSMENT AND PLAN / ED COURSE  Pertinent labs & imaging results that were available during my care of the patient were reviewed by me and considered in my medical decision making (see chart for details).  Patient presents with aggressive and agitated episode. He required chemical sedation for safety in the emergency room. Presently resting comfortably. Based on his previous history of psychiatric disease and discharged today, I suspect this is likely ongoing psychiatric issue. His urine amylase stable, and after sedation is resting comfortably in no distress in the emergency room.  Signed out to Dr. Manson Passey at 11:30 PM, patient is continued to be observed in the emergency room, pending psychiatric consultation as on IVC. All psychiatric screening labs are still pending at this time.   ----------------------------------------- 10:52 PM on 07/08/2015 -----------------------------------------  Patient is calm and resting comfortably now. He does have sleep apnea, and is placed on 2 L nasal cannula due to occasional desaturation to less than 90 while sleeping. He is in no distress. Much calmer. Continue to monitor, we will now send basic labs and obtain psychiatric consultation in the morning. ____________________________________________   FINAL CLINICAL IMPRESSION(S) / ED DIAGNOSES  Final diagnoses:  Delusional disorder  Bipolar affective, manic      Sharyn Creamer, MD 07/08/15 [id number]

## 2015-07-09 ENCOUNTER — Other Ambulatory Visit: Payer: Self-pay

## 2015-07-09 ENCOUNTER — Inpatient Hospital Stay
Admission: EM | Admit: 2015-07-09 | Discharge: 2015-07-11 | DRG: 885 | Disposition: A | Discharge status: Home / Self Care | Payer: Commercial Managed Care - HMO | Source: Ambulatory Visit | Attending: Psychiatry | Admitting: Psychiatry

## 2015-07-09 DIAGNOSIS — R627 Adult failure to thrive: Secondary | ICD-10-CM | POA: Diagnosis not present

## 2015-07-09 DIAGNOSIS — N390 Urinary tract infection, site not specified: Secondary | ICD-10-CM | POA: Diagnosis not present

## 2015-07-09 DIAGNOSIS — G4733 Obstructive sleep apnea (adult) (pediatric): Secondary | ICD-10-CM | POA: Diagnosis present

## 2015-07-09 DIAGNOSIS — Z820 Family history of epilepsy and other diseases of the nervous system: Secondary | ICD-10-CM | POA: Diagnosis not present

## 2015-07-09 DIAGNOSIS — E785 Hyperlipidemia, unspecified: Secondary | ICD-10-CM | POA: Diagnosis present

## 2015-07-09 DIAGNOSIS — I129 Hypertensive chronic kidney disease with stage 1 through stage 4 chronic kidney disease, or unspecified chronic kidney disease: Secondary | ICD-10-CM | POA: Diagnosis present

## 2015-07-09 DIAGNOSIS — G4700 Insomnia, unspecified: Secondary | ICD-10-CM | POA: Diagnosis present

## 2015-07-09 DIAGNOSIS — Z7982 Long term (current) use of aspirin: Secondary | ICD-10-CM | POA: Diagnosis not present

## 2015-07-09 DIAGNOSIS — Z79899 Other long term (current) drug therapy: Secondary | ICD-10-CM

## 2015-07-09 DIAGNOSIS — I509 Heart failure, unspecified: Secondary | ICD-10-CM | POA: Diagnosis not present

## 2015-07-09 DIAGNOSIS — Z841 Family history of disorders of kidney and ureter: Secondary | ICD-10-CM | POA: Diagnosis not present

## 2015-07-09 DIAGNOSIS — Z809 Family history of malignant neoplasm, unspecified: Secondary | ICD-10-CM

## 2015-07-09 DIAGNOSIS — I10 Essential (primary) hypertension: Secondary | ICD-10-CM | POA: Diagnosis present

## 2015-07-09 DIAGNOSIS — F32A Depression, unspecified: Secondary | ICD-10-CM

## 2015-07-09 DIAGNOSIS — E119 Type 2 diabetes mellitus without complications: Secondary | ICD-10-CM

## 2015-07-09 DIAGNOSIS — E11649 Type 2 diabetes mellitus with hypoglycemia without coma: Secondary | ICD-10-CM | POA: Diagnosis present

## 2015-07-09 DIAGNOSIS — F315 Bipolar disorder, current episode depressed, severe, with psychotic features: Secondary | ICD-10-CM | POA: Diagnosis not present

## 2015-07-09 DIAGNOSIS — M109 Gout, unspecified: Secondary | ICD-10-CM | POA: Diagnosis present

## 2015-07-09 DIAGNOSIS — R000 Tachycardia, unspecified: Secondary | ICD-10-CM | POA: Diagnosis not present

## 2015-07-09 DIAGNOSIS — N179 Acute kidney failure, unspecified: Secondary | ICD-10-CM | POA: Diagnosis not present

## 2015-07-09 DIAGNOSIS — N183 Chronic kidney disease, stage 3 (moderate): Secondary | ICD-10-CM | POA: Diagnosis present

## 2015-07-09 DIAGNOSIS — F329 Major depressive disorder, single episode, unspecified: Secondary | ICD-10-CM

## 2015-07-09 DIAGNOSIS — Z8249 Family history of ischemic heart disease and other diseases of the circulatory system: Secondary | ICD-10-CM

## 2015-07-09 DIAGNOSIS — R45851 Suicidal ideations: Secondary | ICD-10-CM | POA: Diagnosis present

## 2015-07-09 LAB — CBC WITH DIFFERENTIAL/PLATELET
Basophils Absolute: 0 10*3/uL (ref 0–0.1)
Basophils Relative: 0 %
Eosinophils Absolute: 0 10*3/uL (ref 0–0.7)
Eosinophils Relative: 1 %
HCT: 42.7 % (ref 40.0–52.0)
Hemoglobin: 14.2 g/dL (ref 13.0–18.0)
Lymphocytes Relative: 19 %
Lymphs Abs: 1.3 10*3/uL (ref 1.0–3.6)
MCH: 30 pg (ref 26.0–34.0)
MCHC: 33.2 g/dL (ref 32.0–36.0)
MCV: 90.3 fL (ref 80.0–100.0)
Monocytes Absolute: 0.7 10*3/uL (ref 0.2–1.0)
Monocytes Relative: 10 %
Neutro Abs: 4.7 10*3/uL (ref 1.4–6.5)
Neutrophils Relative %: 70 %
Platelets: 166 10*3/uL (ref 150–440)
RBC: 4.73 MIL/uL (ref 4.40–5.90)
RDW: 12.4 % (ref 11.5–14.5)
WBC: 6.7 10*3/uL (ref 3.8–10.6)

## 2015-07-09 LAB — URINE DRUG SCREEN, QUALITATIVE (ARMC ONLY)
Amphetamines, Ur Screen: NOT DETECTED
Amphetamines, Ur Screen: NOT DETECTED
Barbiturates, Ur Screen: NOT DETECTED
Barbiturates, Ur Screen: NOT DETECTED
Benzodiazepine, Ur Scrn: NOT DETECTED
Benzodiazepine, Ur Scrn: POSITIVE — AB
Cannabinoid 50 Ng, Ur ~~LOC~~: NOT DETECTED
Cannabinoid 50 Ng, Ur ~~LOC~~: NOT DETECTED
Cocaine Metabolite,Ur ~~LOC~~: NOT DETECTED
Cocaine Metabolite,Ur ~~LOC~~: NOT DETECTED
MDMA (Ecstasy)Ur Screen: NOT DETECTED
MDMA (Ecstasy)Ur Screen: NOT DETECTED
Methadone Scn, Ur: NOT DETECTED
Methadone Scn, Ur: NOT DETECTED
Opiate, Ur Screen: NOT DETECTED
Opiate, Ur Screen: NOT DETECTED
Phencyclidine (PCP) Ur S: NOT DETECTED
Phencyclidine (PCP) Ur S: NOT DETECTED
Tricyclic, Ur Screen: NOT DETECTED
Tricyclic, Ur Screen: NOT DETECTED

## 2015-07-09 LAB — URINALYSIS COMPLETE WITH MICROSCOPIC (ARMC ONLY)
Bilirubin Urine: NEGATIVE
Glucose, UA: >500 mg/dL — AB
Ketones, ur: NEGATIVE mg/dL
Nitrite: NEGATIVE
Protein, ur: 100 mg/dL — AB
Specific Gravity, Urine: 1.016 (ref 1.005–1.030)
Squamous Epithelial / LPF: NONE SEEN
pH: 5 (ref 5.0–8.0)

## 2015-07-09 LAB — COMPREHENSIVE METABOLIC PANEL
ALT: 25 U/L (ref 17–63)
AST: 40 U/L (ref 15–41)
Albumin: 3.8 g/dL (ref 3.5–5.0)
Alkaline Phosphatase: 94 U/L (ref 38–126)
Anion gap: 10 (ref 5–15)
BUN: 26 mg/dL — ABNORMAL HIGH (ref 6–20)
CO2: 29 mmol/L (ref 22–32)
Calcium: 9 mg/dL (ref 8.9–10.3)
Chloride: 98 mmol/L — ABNORMAL LOW (ref 101–111)
Creatinine, Ser: 1.67 mg/dL — ABNORMAL HIGH (ref 0.61–1.24)
GFR calc Af Amer: 49 mL/min — ABNORMAL LOW (ref >60)
GFR calc non Af Amer: 43 mL/min — ABNORMAL LOW (ref >60)
Glucose, Bld: 296 mg/dL — ABNORMAL HIGH (ref 65–99)
Potassium: 3.7 mmol/L (ref 3.5–5.1)
Sodium: 137 mmol/L (ref 135–145)
Total Bilirubin: 0.6 mg/dL (ref 0.3–1.2)
Total Protein: 7.3 g/dL (ref 6.5–8.1)

## 2015-07-09 LAB — ACETAMINOPHEN LEVEL: Acetaminophen (Tylenol), Serum: <10 ug/mL — ABNORMAL LOW (ref 10–30)

## 2015-07-09 LAB — ETHANOL: Alcohol, Ethyl (B): <5 mg/dL (ref <5)

## 2015-07-09 LAB — SALICYLATE LEVEL: Salicylate Lvl: <4 mg/dL (ref 2.8–30.0)

## 2015-07-09 MED ORDER — ALLOPURINOL 300 MG PO TABS
300.0000 mg | ORAL_TABLET | Freq: Every day | ORAL | Status: DC
  Administered 2015-07-09: 300 mg via ORAL
  Filled 2015-07-09 (×2): qty 1

## 2015-07-09 MED ORDER — LURASIDONE HCL 40 MG PO TABS
40.0000 mg | ORAL_TABLET | Freq: Every day | ORAL | Status: DC
  Administered 2015-07-10 – 2015-07-11 (×2): 40 mg via ORAL
  Filled 2015-07-09 (×2): qty 1

## 2015-07-09 MED ORDER — LAMOTRIGINE 100 MG PO TABS
100.0000 mg | ORAL_TABLET | Freq: Every day | ORAL | Status: DC
  Administered 2015-07-09 – 2015-07-10 (×2): 100 mg via ORAL
  Filled 2015-07-09 (×2): qty 1

## 2015-07-09 MED ORDER — ACETAMINOPHEN 325 MG PO TABS
650.0000 mg | ORAL_TABLET | Freq: Four times a day (QID) | ORAL | Status: DC | PRN
  Administered 2015-07-10 (×2): 650 mg via ORAL
  Filled 2015-07-09 (×2): qty 2

## 2015-07-09 MED ORDER — HALOPERIDOL 5 MG PO TABS
5.0000 mg | ORAL_TABLET | Freq: Four times a day (QID) | ORAL | Status: DC
Start: 2015-07-09 — End: 2015-07-09
  Administered 2015-07-09: 5 mg via ORAL
  Filled 2015-07-09: qty 1

## 2015-07-09 MED ORDER — LURASIDONE HCL 40 MG PO TABS
40.0000 mg | ORAL_TABLET | Freq: Every day | ORAL | Status: DC
  Filled 2015-07-09 (×2): qty 1

## 2015-07-09 MED ORDER — LAMOTRIGINE 100 MG PO TABS
100.0000 mg | ORAL_TABLET | Freq: Every day | ORAL | Status: DC
  Filled 2015-07-09 (×2): qty 1

## 2015-07-09 MED ORDER — GLIPIZIDE ER 5 MG PO TB24
5.0000 mg | ORAL_TABLET | Freq: Every day | ORAL | Status: DC
  Filled 2015-07-09 (×2): qty 1

## 2015-07-09 MED ORDER — AMLODIPINE BESYLATE 5 MG PO TABS
10.0000 mg | ORAL_TABLET | Freq: Every day | ORAL | Status: DC
  Administered 2015-07-09: 10 mg via ORAL
  Filled 2015-07-09: qty 2
  Filled 2015-07-09: qty 1

## 2015-07-09 MED ORDER — LORAZEPAM 2 MG/ML IJ SOLN
2.0000 mg | Freq: Once | INTRAMUSCULAR | Status: AC
  Administered 2015-07-09: 2 mg via INTRAMUSCULAR

## 2015-07-09 MED ORDER — HALOPERIDOL 5 MG PO TABS: 5.0000 mg | ORAL_TABLET | Freq: Four times a day (QID) | ORAL | Status: DC | PRN

## 2015-07-09 MED ORDER — ATORVASTATIN CALCIUM 20 MG PO TABS
10.0000 mg | ORAL_TABLET | Freq: Every day | ORAL | Status: DC
  Administered 2015-07-09: 10 mg via ORAL
  Filled 2015-07-09: qty 1

## 2015-07-09 MED ORDER — ZIPRASIDONE MESYLATE 20 MG IM SOLR
20.0000 mg | Freq: Once | INTRAMUSCULAR | Status: AC
  Administered 2015-07-09: 20 mg via INTRAMUSCULAR

## 2015-07-09 MED ORDER — ASPIRIN EC 81 MG PO TBEC
81.0000 mg | DELAYED_RELEASE_TABLET | Freq: Every day | ORAL | Status: DC
  Administered 2015-07-10 – 2015-07-11 (×2): 81 mg via ORAL
  Filled 2015-07-09 (×2): qty 1

## 2015-07-09 MED ORDER — COLCHICINE 0.6 MG PO TABS
0.6000 mg | ORAL_TABLET | Freq: Every day | ORAL | Status: DC
  Administered 2015-07-09: 0.6 mg via ORAL
  Filled 2015-07-09: qty 1

## 2015-07-09 MED ORDER — FUROSEMIDE 20 MG PO TABS
20.0000 mg | ORAL_TABLET | Freq: Every day | ORAL | Status: DC
  Administered 2015-07-10 – 2015-07-11 (×2): 20 mg via ORAL
  Filled 2015-07-09 (×2): qty 1

## 2015-07-09 MED ORDER — LORAZEPAM 1 MG PO TABS
1.0000 mg | ORAL_TABLET | Freq: Four times a day (QID) | ORAL | Status: DC
  Administered 2015-07-09 (×2): 1 mg via ORAL
  Filled 2015-07-09 (×2): qty 1

## 2015-07-09 MED ORDER — FUROSEMIDE 40 MG PO TABS
20.0000 mg | ORAL_TABLET | Freq: Every day | ORAL | Status: DC
  Administered 2015-07-09: 20 mg via ORAL
  Filled 2015-07-09: qty 1

## 2015-07-09 MED ORDER — QUETIAPINE FUMARATE 200 MG PO TABS: 400.0000 mg | ORAL_TABLET | Freq: Every day | ORAL | Status: DC

## 2015-07-09 MED ORDER — MIRTAZAPINE 30 MG PO TABS
30.0000 mg | ORAL_TABLET | Freq: Every day | ORAL | Status: DC
  Filled 2015-07-09 (×2): qty 1

## 2015-07-09 MED ORDER — ALLOPURINOL 300 MG PO TABS
300.0000 mg | ORAL_TABLET | Freq: Every day | ORAL | Status: DC
  Administered 2015-07-10 – 2015-07-11 (×2): 300 mg via ORAL
  Filled 2015-07-09 (×2): qty 1

## 2015-07-09 MED ORDER — ALUM & MAG HYDROXIDE-SIMETH 200-200-20 MG/5ML PO SUSP: 30.0000 mL | ORAL | Status: DC | PRN

## 2015-07-09 MED ORDER — HALOPERIDOL LACTATE 5 MG/ML IJ SOLN
INTRAMUSCULAR | Status: AC
  Administered 2015-07-09: 10 mg via INTRAMUSCULAR
  Filled 2015-07-09: qty 2

## 2015-07-09 MED ORDER — ASPIRIN EC 81 MG PO TBEC
81.0000 mg | DELAYED_RELEASE_TABLET | Freq: Every day | ORAL | Status: DC
  Administered 2015-07-09: 81 mg via ORAL
  Filled 2015-07-09: qty 1

## 2015-07-09 MED ORDER — METOPROLOL TARTRATE 100 MG PO TABS
100.0000 mg | ORAL_TABLET | Freq: Every day | ORAL | Status: DC
  Filled 2015-07-09: qty 1

## 2015-07-09 MED ORDER — CLONAZEPAM 0.5 MG PO TABS
0.5000 mg | ORAL_TABLET | Freq: Two times a day (BID) | ORAL | Status: DC
  Administered 2015-07-09 – 2015-07-10 (×2): 0.5 mg via ORAL
  Filled 2015-07-09 (×2): qty 1

## 2015-07-09 MED ORDER — HALOPERIDOL 5 MG PO TABS
5.0000 mg | ORAL_TABLET | Freq: Four times a day (QID) | ORAL | Status: DC | PRN
  Administered 2015-07-09: 5 mg via ORAL
  Filled 2015-07-09: qty 1

## 2015-07-09 MED ORDER — LORAZEPAM 2 MG/ML IJ SOLN: 2.0000 mg | Freq: Once | INTRAMUSCULAR | Status: DC

## 2015-07-09 MED ORDER — TRAZODONE HCL 100 MG PO TABS
200.0000 mg | ORAL_TABLET | Freq: Every day | ORAL | Status: DC
Start: 2015-07-09 — End: 2015-07-09

## 2015-07-09 MED ORDER — HALOPERIDOL LACTATE 5 MG/ML IJ SOLN
10.0000 mg | Freq: Once | INTRAMUSCULAR | Status: AC
  Administered 2015-07-09: 10 mg via INTRAMUSCULAR

## 2015-07-09 MED ORDER — GLIPIZIDE ER 5 MG PO TB24
5.0000 mg | ORAL_TABLET | Freq: Every day | ORAL | Status: DC
  Administered 2015-07-10 – 2015-07-11 (×2): 5 mg via ORAL
  Filled 2015-07-09 (×3): qty 1

## 2015-07-09 MED ORDER — COLCHICINE 0.6 MG PO TABS
0.6000 mg | ORAL_TABLET | Freq: Every day | ORAL | Status: DC
  Administered 2015-07-10 – 2015-07-11 (×2): 0.6 mg via ORAL
  Filled 2015-07-09 (×2): qty 1

## 2015-07-09 MED ORDER — LORAZEPAM 1 MG PO TABS
1.0000 mg | ORAL_TABLET | Freq: Two times a day (BID) | ORAL | Status: DC
  Administered 2015-07-09 – 2015-07-11 (×4): 1 mg via ORAL
  Filled 2015-07-09 (×4): qty 1

## 2015-07-09 MED ORDER — METOPROLOL SUCCINATE ER 50 MG PO TB24
100.0000 mg | ORAL_TABLET | Freq: Every day | ORAL | Status: DC
  Administered 2015-07-09: 100 mg via ORAL
  Filled 2015-07-09: qty 2

## 2015-07-09 MED ORDER — ATORVASTATIN CALCIUM 20 MG PO TABS
10.0000 mg | ORAL_TABLET | Freq: Every day | ORAL | Status: DC
  Administered 2015-07-10: 10 mg via ORAL
  Filled 2015-07-09 (×2): qty 1

## 2015-07-09 MED ORDER — MAGNESIUM HYDROXIDE 400 MG/5ML PO SUSP: 30.0000 mL | Freq: Every day | ORAL | Status: DC | PRN

## 2015-07-09 MED ORDER — MIRTAZAPINE 15 MG PO TBDP
30.0000 mg | ORAL_TABLET | Freq: Every day | ORAL | Status: DC
  Administered 2015-07-09 – 2015-07-10 (×2): 30 mg via ORAL
  Filled 2015-07-09 (×4): qty 2

## 2015-07-09 MED ORDER — TRAZODONE HCL 100 MG PO TABS
200.0000 mg | ORAL_TABLET | Freq: Every day | ORAL | Status: DC
  Administered 2015-07-09 – 2015-07-10 (×2): 200 mg via ORAL
  Filled 2015-07-09 (×2): qty 2

## 2015-07-09 MED ORDER — LORAZEPAM 2 MG PO TABS
2.0000 mg | ORAL_TABLET | Freq: Once | ORAL | Status: AC
  Administered 2015-07-09: 2 mg via ORAL
  Filled 2015-07-09: qty 1

## 2015-07-09 MED ORDER — QUETIAPINE FUMARATE 200 MG PO TABS
400.0000 mg | ORAL_TABLET | Freq: Every day | ORAL | Status: DC
  Administered 2015-07-09 – 2015-07-10 (×2): 400 mg via ORAL
  Filled 2015-07-09 (×2): qty 2

## 2015-07-09 MED ORDER — AMLODIPINE BESYLATE 10 MG PO TABS
10.0000 mg | ORAL_TABLET | Freq: Every day | ORAL | Status: DC
  Administered 2015-07-10 – 2015-07-11 (×2): 10 mg via ORAL
  Filled 2015-07-09 (×2): qty 1

## 2015-07-09 MED ORDER — CLONAZEPAM 0.5 MG PO TABS: 0.5000 mg | ORAL_TABLET | Freq: Two times a day (BID) | ORAL | Status: DC

## 2015-07-09 MED ORDER — METOPROLOL SUCCINATE ER 25 MG PO TB24
100.0000 mg | ORAL_TABLET | Freq: Every day | ORAL | Status: DC
  Administered 2015-07-10 – 2015-07-11 (×2): 100 mg via ORAL
  Filled 2015-07-09 (×2): qty 4

## 2015-07-09 NOTE — ED Notes (Signed)
Pt presents from home with wife. Wife reports pt was just d/c from [facility] today she picked him up brought him home changed clothes got pt in to the house he started pacing, talking to sheriff that wasn't there stated they were going to drag him out and beat him. Pt didn't have HI. Pt doesn't have any SI no active thoughts per wife. Pt is having paranoia per wife. Pt has had ECT several times in the past. Pt has had a psych MD for years that did ECT. Pt hospitalized x 5 tried to kill self x 4. Pt is a quiet person to begin with. [facility] This behavior now manic for the last year. Pt was major depression til last year dx with bipolar. Pt was IVC on July 30 th for hanging himself admitted several times into the hospital.  Wife noted she was concerned about her husband because on the 8th they told her he was going to be d/c but she was concerned about his med's he [facility] to be checked urine blood EKG done. On the 9th Pt keep saying at the [facility] "how am I leaving,   I don't have clothes, I cant walk ." wife reported he was lying in the floor then pacing back and forth paranoia

## 2015-07-09 NOTE — ED Notes (Signed)
BEHAVIORAL HEALTH ROUNDING Patient sleeping: No. Patient alert and oriented: yes Behavior appropriate: Yes.  ; If no, describe:  Nutrition and fluids offered: Yes  Toileting and hygiene offered: Yes  Sitter present: yes Law enforcement present: Yes  

## 2015-07-09 NOTE — ED Notes (Addendum)
BEHAVIORAL HEALTH ROUNDING Patient sleeping: No. Patient alert and oriented: Alert but not Oriented Behavior appropriate: No.; If no, describe: see notes Nutrition and fluids offered: Yes  Toileting and hygiene offered: Yes  Sitter present: yes Law enforcement present: Yes

## 2015-07-09 NOTE — ED Notes (Signed)
ENVIRONMENTAL ASSESSMENT Potentially harmful objects out of patient reach: Yes.   Personal belongings secured: Yes.   Patient dressed in hospital provided attire only: Yes.   Plastic bags out of patient reach: Yes.   Patient care equipment (cords, cables, call bells, lines, and drains) shortened, removed, or accounted for: No. pt is being monitored Equipment and supplies removed from bottom of stretcher: No. Potentially toxic materials out of patient reach: Yes.   Sharps container removed or out of patient reach: No.

## 2015-07-09 NOTE — ED Notes (Addendum)
Patient is pacing the room and into the hallway. ODS reports that Patient has been "staring" at her gun and stating "you need to shoot me"  When questioned about his agitation, patient responds "Im way past that".  Dr. Manson Passey notified and medications orders rcvd. ODS and [employer] within proximity for safety.  Comfort measures provided

## 2015-07-09 NOTE — ED Notes (Signed)
Patient medicated for acute agitation and escalation.

## 2015-07-09 NOTE — ED Notes (Signed)
BEHAVIORAL HEALTH ROUNDING Patient sleeping: No. Patient alert and oriented: yes Behavior appropriate: Yes.  ; If no, describe: n/a Nutrition and fluids offered: Yes  Toileting and hygiene offered: Yes  Sitter present: yes Law enforcement present: Yes  

## 2015-07-09 NOTE — ED Notes (Signed)

## 2015-07-09 NOTE — ED Notes (Signed)
BEHAVIORAL HEALTH ROUNDING Patient sleeping: NO Patient alert and oriented: YES Behavior appropriate: YES Describe behavior: No inappropriate or unacceptable behaviors noted at this time.  Nutrition and fluids offered: YES Toileting and hygiene offered: YES Sitter present: Behavioral tech rounding every 15 minutes on patient to ensure safety.  Law enforcement present: YES Law enforcement agency: [employer] ([employer]) 

## 2015-07-09 NOTE — ED Notes (Signed)
BEHAVIORAL HEALTH ROUNDING Patient sleeping: No. Patient alert and oriented: no not during times he is manic Behavior appropriate: No.; If no, describe: pt has been moved to room 20 A pt is flat restless rocking back and forth in the hallway took a few minutes to get pt to lie down on the bed again. Nutrition and fluids offered: Yes  Toileting and hygiene offered: Yes  Sitter present: yes Law enforcement present: Yes

## 2015-07-09 NOTE — ED Notes (Signed)
BEHAVIORAL HEALTH ROUNDING Patient sleeping: No. Patient alert and oriented: no Behavior appropriate: No.; If no, describe: pt restless manic like again talking about sheriff beating him just let them get him pt sitting on the side of the bed. MD made aware. Nutrition and fluids offered: Yes  Toileting and hygiene offered: Yes  Sitter present: yes Law enforcement present: Yes

## 2015-07-09 NOTE — ED Notes (Signed)
Patient assigned to appropriate care area. Patient oriented to unit/care area: Informed that, for their safety, care areas are designed for safety and monitored by security cameras at all times; and visiting hours explained to patient. Patient verbalizes understanding, and verbal contract for safety obtained. 

## 2015-07-09 NOTE — ED Notes (Signed)
BEHAVIORAL HEALTH ROUNDING Patient sleeping: No Patient alert and oriented: Yes Behavior appropriate: No; If no, describe: Patient pacing and becoming very visibly aggitated Nutrition and fluids offered: Yes  Toileting and hygiene offered: Yes  Sitter present: yes Law enforcement present: Yes

## 2015-07-09 NOTE — ED Notes (Signed)
ENVIRONMENTAL ASSESSMENT Potentially harmful objects out of patient reach: YES Personal belongings secured: YES Patient dressed in hospital provided attire only: YES Plastic bags out of patient reach: YES Patient care equipment (cords, cables, call bells, lines, and drains) shortened, removed, or accounted for: YES Equipment and supplies removed from bottom of stretcher: YES Potentially toxic materials out of patient reach: YES Sharps container removed or out of patient reach: YES  

## 2015-07-09 NOTE — ED Provider Notes (Signed)
-----------------------------------------   8:55 PM on 07/09/2015 -----------------------------------------   Blood pressure 145/99, pulse 95, temperature 99 F (37.2 C), temperature source Oral, resp. rate 18, height 6' (1.829 m), weight 243 lb (110.224 kg), SpO2 99 %.  The patient had no acute events since last update.  Calm and cooperative at this time.  Patient had missed his scheduled dose of Haldol and Ativan earlier in the day and with had to be given Geodon and Ativan to help control his agitation. He is been accepted for inpatient management in the behavioral medicine unit and is medically stable for transfer to behavioral medicine and further management per the behavioral medicine team.   Sharman Cheek, MD 07/09/15 [id number]

## 2015-07-09 NOTE — ED Provider Notes (Signed)
Patient with aggressive behavior, barring 10 mg of Haldol to calm him down. Currently resting comfortably, no acute distress. Vital signs are stable.  Emily Filbert, MD 07/09/15 [id number]

## 2015-07-09 NOTE — BH Assessment (Signed)
Assessment Note  Christian [name] is an 64 y.o. male. Christian [name] arrived to the ED with his family due to paranoid behaviors.  Christian [name] was discharged from [facility] today 07/08/2015.  He is reported as displaying bizarre behaviors prior to his discharge, and left with his family. Christian [name] believed that he could not walk and that the staff would imprison himself and his family.  Christian [name] continued to appear agitated and his family brought him to the ED.  He is reported of being inpatient at least 5 times with 4 known attempts to commit suicide.  He states "I'm gonna die, because there is no reason for me to live".  Christian [name] has been inpatient at [facility], [facility], [facility], [place], and [facility].  Use of alcohol and drugs are denied.  Axis I: Bipolar, mixed Axis II: Deferred Axis III:  Past Medical History  Diagnosis Date  . Mood swings   . Type II or unspecified type diabetes mellitus with unspecified complication, uncontrolled 06/02/2014  . Other and unspecified hyperlipidemia 06/02/2014  . OSA on CPAP 06/02/2014  . 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 Bipolar disorder   . CHF (congestive heart failure)   . CKD (chronic kidney disease), stage III   . Diabetes mellitus without complication   . Hypertension   . Gout   . Bipolar 1 disorder   . OSA on CPAP    Axis IV: other psychosocial or environmental problems Axis V: 11-20 some danger of hurting self or others possible OR occasionally fails to maintain minimal personal hygiene OR gross impairment in communication  Past Medical History:  Past Medical History  Diagnosis Date  . Mood swings   . Type II or unspecified type diabetes mellitus with unspecified complication, uncontrolled 06/02/2014  . Other and unspecified hyperlipidemia 06/02/2014  . OSA on CPAP 06/02/2014  . 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 Bipolar disorder   . CHF  (congestive heart failure)   . CKD (chronic kidney disease), stage III   . Diabetes mellitus without complication   . Hypertension   . Gout   . Bipolar 1 disorder   . OSA on CPAP     History reviewed. No pertinent past surgical history.  Family History:  Family History  Problem Relation Age of Onset  . Dementia Mother   . Arthritis Father   . Heart failure Neg Hx   . Kidney failure Neg Hx   . Cancer Neg Hx     Social History:  reports that he has never smoked. He does not have any smokeless tobacco history on file. He reports that he does not drink alcohol or use illicit drugs.  Additional Social History:  Alcohol / Drug Use History of alcohol / drug use?: No history of alcohol / drug abuse  CIWA: CIWA-Ar BP: (!) 167/99 mmHg Pulse Rate: (!) 103 COWS:    Allergies: No Known Allergies  Home Medications:  (Not in a hospital admission)  OB/GYN Status:  No LMP for male patient.  General Assessment Data Location of Assessment: [facility] ED TTS Assessment: In system Is this a Tele or Face-to-Face Assessment?: Face-to-Face Is this an Initial Assessment or a Re-assessment for this encounter?: Initial Assessment Marital status: Married [place] name: n/a Is patient pregnant?: No Pregnancy Status: No Living Arrangements: Spouse/significant other Can pt return to current living arrangement?: Yes Admission Status: Involuntary Is patient capable of signing voluntary  admission?: Yes Referral Source: Self/Family/Friend Insurance type: Medicare-[employer]  Medical Screening Exam [facility] Walk-in ONLY) Medical Exam completed: Yes  Crisis Care Plan Living Arrangements: Spouse/significant other Name of Psychiatrist: Emerson Monte, MD Name of Therapist: None reported  Education Status Is patient currently in school?: No Current Grade: n/a Highest grade of school patient has completed: Bachelors Degree in Social Work Name of school: [employer] Contact person: n/a  Risk to self with the  past 6 months Suicidal Ideation: Yes-Currently Present Has patient been a risk to self within the past 6 months prior to admission? : Yes Suicidal Intent: Yes-Currently Present Has patient had any suicidal intent within the past 6 months prior to admission? : Yes Is patient at risk for suicide?: Yes Suicidal Plan?: Yes-Currently Present Has patient had any suicidal plan within the past 6 months prior to admission? : Yes Specify Current Suicidal Plan: Hanging himself Access to Means: Yes Specify Access to Suicidal Means: Continuous attempts to commit suicide What has been your use of drugs/alcohol within the last 12 months?: Denied use of drugs and alcohol Previous Attempts/Gestures: Yes How many times?: 4 Other Self Harm Risks: none reported Triggers for Past Attempts: Unpredictable, Unknown Intentional Self Injurious Behavior: None Family Suicide History: No Recent stressful life event(s): Other (Comment) (Unknown) Persecutory voices/beliefs?: No Depression: Yes Depression Symptoms:  (Worrying) Substance abuse history and/or treatment for substance abuse?: No Suicide prevention information given to non-admitted patients: Not applicable  Risk to Others within the past 6 months Homicidal Ideation: No Does patient have any lifetime risk of violence toward others beyond the six months prior to admission? : No Thoughts of Harm to Others: No Current Homicidal Intent: No Current Homicidal Plan: No Access to Homicidal Means: No Identified Victim: None reported History of harm to others?: No Assessment of Violence: On admission Violent Behavior Description: None reported Does patient have access to weapons?: No Criminal Charges Pending?: No Does patient have a court date: No Is patient on probation?: No  Psychosis Hallucinations: None noted Delusions: None noted  Mental Status Report Appearance/Hygiene: Disheveled, In scrubs Eye Contact: Poor Motor Activity:  Unremarkable Speech: Pressured Level of Consciousness: Alert Mood: Anxious Affect: Anxious Anxiety Level: Severe Thought Processes: Unable to Assess Judgement: Partial Orientation: Person, Place, Time Obsessive Compulsive Thoughts/Behaviors: None     ADLScreening [facility] Assessment Services) Patient's cognitive ability adequate to safely complete daily activities?: Yes Patient able to express need for assistance with ADLs?: Yes Independently performs ADLs?: Yes (appropriate for developmental age)  Prior Inpatient Therapy Prior Inpatient Therapy: Yes Prior Therapy Dates: Multiple dates in 2015-2016 Prior Therapy Facilty/Provider(s): [facility], [facility], [facility], [facility], [facility], [facility] Reason for Treatment: Suicidal attempts, Bipolar Disorder  Prior Outpatient Therapy Prior Outpatient Therapy: Yes Prior Therapy Dates: Current Prior Therapy Facilty/Provider(s): Emerson Monte, MD Reason for Treatment: Bipolar disorder Does patient have an ACCT team?: No Does patient have Intensive In-House Services?  : No Does patient have [facility] services? : No Does patient have P4CC services?: No  ADL Screening (condition at time of admission) Patient's cognitive ability adequate to safely complete daily activities?: Yes Patient able to express need for assistance with ADLs?: Yes Independently performs ADLs?: Yes (appropriate for developmental age)       Abuse/Neglect Assessment (Assessment to be complete while patient is alone) Physical Abuse: Denies Verbal Abuse: Denies Sexual Abuse: Denies Exploitation of patient/patient's resources: Denies Self-Neglect: Denies Values / Beliefs Cultural Requests During Hospitalization: None Spiritual Requests During Hospitalization: None   Advance Directives (  For Healthcare) Does patient have an advance directive?: No Would patient like information on creating an advanced directive?: No - patient declined information     Additional Information 1:1 In Past 12 Months?: No CIRT Risk: No Elopement Risk: No Does patient have medical clearance?: Yes     Disposition:  Disposition Initial Assessment Completed for this Encounter: Yes Disposition of Patient: Referred to  On Site Evaluation by:   Reviewed with Physician:    Justice Deeds 07/09/2015 12:01 AM

## 2015-07-09 NOTE — Consult Note (Signed)
[facility] Consult   Reason for Consult:  This is a 64 year old man with a history of bipolar disorder currently with psychotic depression currently under commitment Referring Physician:  Joni Fears Patient Identification: Christian [name] MRN:  [id number] Principal Diagnosis: Bipolar disorder, current episode depressed, severe, with psychotic features Diagnosis:   Patient Active Problem List   Diagnosis Date Noted  . Bipolar disorder, current episode depressed, severe, with psychotic features [F31.5] 07/09/2015  . Diabetes [E11.9] 07/09/2015  . Bipolar affective, manic [F31.10] 06/21/2014  . UTI (urinary tract infection) [N39.0] 06/19/2014  . Positive blood culture [R78.81] 06/19/2014  . Bipolar affective disorder, current episode manic with psychotic symptoms [F31.2] 06/17/2014  . Type II or unspecified type diabetes mellitus with unspecified complication, uncontrolled [E11.8] 06/02/2014  . Other and unspecified hyperlipidemia [E78.5] 06/02/2014  . OSA on CPAP [G47.33] 06/02/2014  . 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 Gout [M10.9] 06/02/2014  . Hypertension [I10]     Total Time spent with patient: 1 hour  Subjective:   Christian [name] is a 64 y.o. male patient admitted with "I'm dirty"  HPI:  Information from the patient and the chart and also from a conversation with his wife. This 60 year old man with a history of bipolar disorder was discharged from old [facility] yesterday. On the way home he began to get agitated showing signs of the same delusions he had when he came into the hospital. At home he started pacing got very agitated. His wife knew he was still sick and brought him here to the hospital. The patient gives only minimal history because of the severity of his illness. He talks about how he is dirty and how he is a bad person. Doesn't answer many questions directly. Wife states that he actually had  been doing fairly well through the winter and spring of this year but then a couple months ago had a serious suicide attempt. Since then he has been very decompensated. He was at [facility] briefly and then sent to old [place]. Wife does not think that he improved any while he was at old [place]. Patient has been withdrawn, expressing delusional negative thoughts and suicidal ideation. This is despite apparently being compliant with prescribed medication. His medicines are not significantly different from what he was taking when he was stable for an extended period of time. There is no known stress that would've made things worse.  Past psychiatric history long history of bipolar disorder he has presented with both manic and depressive episodes. He has a history of suicide attempts in the past. Patient has had some periods even recently of relative stability. Multiple medications have been tried and he has also had more than 1 episode of ECT treatment.  Medical history: High blood pressure diabetes gout. Patient has a history of using a CPAP machine for sleep apnea. Wife states he used to be much more obese and she thinks he can do without it now.  Social history: Patient is married. Has 1 adult child I believe. Good relationship with his wife. He is retired.  Family history: Unknown  Substance abuse history: No recent substance abuse doesn't appear to of ever been a significant part of his illness.  Current medications: See the intake note the medications listed I confirmed with the wife are correct. HPI Elements:   Quality:  Depression with psychotic features and agitation. Severity:  Severe and life-threatening. Timing:  Going on now for more  than a month. Duration:  Ongoing and without improvement. Context:  Chronic illness.  Past Medical History:  Past Medical History  Diagnosis Date  . Mood swings   . Type II or unspecified type diabetes mellitus with unspecified complication,  uncontrolled 06/02/2014  . Other and unspecified hyperlipidemia 06/02/2014  . OSA on CPAP 06/02/2014  . 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 Bipolar disorder   . CHF (congestive heart failure)   . CKD (chronic kidney disease), stage III   . Diabetes mellitus without complication   . Hypertension   . Gout   . Bipolar 1 disorder   . OSA on CPAP    History reviewed. No pertinent past surgical history. Family History:  Family History  Problem Relation Age of Onset  . Dementia Mother   . Arthritis Father   . Heart failure Neg Hx   . Kidney failure Neg Hx   . Cancer Neg Hx    Social History:  History  Alcohol Use No     History  Drug Use No    Social History   Social History  . Marital Status: Married    Spouse Name: N/A  . Number of Children: N/A  . Years of Education: N/A   Social History Main Topics  . Smoking status: Never Smoker   . Smokeless tobacco: None  . Alcohol Use: No  . Drug Use: No  . Sexual Activity: Not Currently   Other Topics Concern  . None   Social History Narrative   ** Merged History Encounter **       Additional Social History:    History of alcohol / drug use?: No history of alcohol / drug abuse                     Allergies:  No Known Allergies  Labs:  Results for orders placed or performed during the hospital encounter of 07/08/15 (from the past 48 hour(s))  Urine Drug Screen, Qualitative ([place] only)     Status: None   Collection Time: 07/09/15  1:40 AM  Result Value Ref Range   Tricyclic, Ur Screen NONE DETECTED NONE DETECTED   Amphetamines, Ur Screen NONE DETECTED NONE DETECTED   MDMA (Ecstasy)Ur Screen NONE DETECTED NONE DETECTED   Cocaine Metabolite,Ur [place] NONE DETECTED NONE DETECTED   Opiate, Ur Screen NONE DETECTED NONE DETECTED   Phencyclidine (PCP) Ur S NONE DETECTED NONE DETECTED   Cannabinoid 50 Ng, Ur [place] NONE DETECTED NONE DETECTED   Barbiturates, Ur Screen NONE DETECTED  NONE DETECTED   Benzodiazepine, Ur Scrn NONE DETECTED NONE DETECTED   Methadone Scn, Ur NONE DETECTED NONE DETECTED    Comment: (NOTE) 212  Tricyclics, urine               Cutoff 1000 ng/mL 200  Amphetamines, urine             Cutoff 1000 ng/mL 300  MDMA (Ecstasy), urine           Cutoff 500 ng/mL 400  Cocaine Metabolite, urine       Cutoff 300 ng/mL 500  Opiate, urine                   Cutoff 300 ng/mL 600  Phencyclidine (PCP), urine      Cutoff 25 ng/mL 700  Cannabinoid, urine              Cutoff 50 ng/mL 800  Barbiturates,  urine             Cutoff 200 ng/mL 900  Benzodiazepine, urine           Cutoff 200 ng/mL 1000 Methadone, urine                Cutoff 300 ng/mL 1100 1200 The urine drug screen provides only a preliminary, unconfirmed 1300 analytical test result and should not be used for non-medical 1400 purposes. Clinical consideration and professional judgment should 1500 be applied to any positive drug screen result due to possible 1600 interfering substances. A more specific alternate chemical method 1700 must be used in order to obtain a confirmed analytical result.  1800 Gas chromato graphy / mass spectrometry (GC/MS) is the preferred 1900 confirmatory method.   CBC WITH DIFFERENTIAL     Status: None   Collection Time: 07/09/15  1:46 AM  Result Value Ref Range   WBC 6.7 3.8 - 10.6 K/uL   RBC 4.73 4.40 - 5.90 MIL/uL   Hemoglobin 14.2 13.0 - 18.0 g/dL   HCT 42.7 40.0 - 52.0 %   MCV 90.3 80.0 - 100.0 fL   MCH 30.0 26.0 - 34.0 pg   MCHC 33.2 32.0 - 36.0 g/dL   RDW 12.4 11.5 - 14.5 %   Platelets 166 150 - 440 K/uL   Neutrophils Relative % 70 %   Neutro Abs 4.7 1.4 - 6.5 K/uL   Lymphocytes Relative 19 %   Lymphs Abs 1.3 1.0 - 3.6 K/uL   Monocytes Relative 10 %   Monocytes Absolute 0.7 0.2 - 1.0 K/uL   Eosinophils Relative 1 %   Eosinophils Absolute 0.0 0 - 0.7 K/uL   Basophils Relative 0 %   Basophils Absolute 0.0 0 - 0.1 K/uL  Comprehensive metabolic panel      Status: Abnormal   Collection Time: 07/09/15  1:46 AM  Result Value Ref Range   Sodium 137 135 - 145 mmol/L   Potassium 3.7 3.5 - 5.1 mmol/L   Chloride 98 (L) 101 - 111 mmol/L   CO2 29 22 - 32 mmol/L   Glucose, Bld 296 (H) 65 - 99 mg/dL   BUN 26 (H) 6 - 20 mg/dL   Creatinine, Ser 1.67 (H) 0.61 - 1.24 mg/dL   Calcium 9.0 8.9 - 10.3 mg/dL   Total Protein 7.3 6.5 - 8.1 g/dL   Albumin 3.8 3.5 - 5.0 g/dL   AST 40 15 - 41 U/L   ALT 25 17 - 63 U/L   Alkaline Phosphatase 94 38 - 126 U/L   Total Bilirubin 0.6 0.3 - 1.2 mg/dL   GFR calc non Af Amer 43 (L) >60 mL/min   GFR calc Af Amer 49 (L) >60 mL/min    Comment: (NOTE) The eGFR has been calculated using the CKD EPI equation. This calculation has not been validated in all clinical situations. eGFR's persistently <60 mL/min signify possible Chronic Kidney Disease.    Anion gap 10 5 - 15  Ethanol     Status: None   Collection Time: 07/09/15  1:46 AM  Result Value Ref Range   Alcohol, Ethyl (B) <5 <5 mg/dL    Comment:        LOWEST DETECTABLE LIMIT FOR SERUM ALCOHOL IS 5 mg/dL FOR MEDICAL PURPOSES ONLY   Salicylate level     Status: None   Collection Time: 07/09/15  1:46 AM  Result Value Ref Range   Salicylate Lvl <9.6 2.8 - 30.0 mg/dL  Acetaminophen  level     Status: Abnormal   Collection Time: 07/09/15  1:46 AM  Result Value Ref Range   Acetaminophen (Tylenol), Serum <10 (L) 10 - 30 ug/mL    Comment:        THERAPEUTIC CONCENTRATIONS VARY SIGNIFICANTLY. A RANGE OF 10-30 ug/mL MAY BE AN EFFECTIVE CONCENTRATION FOR MANY PATIENTS. HOWEVER, SOME ARE BEST TREATED AT CONCENTRATIONS OUTSIDE THIS RANGE. ACETAMINOPHEN CONCENTRATIONS >150 ug/mL AT 4 HOURS AFTER INGESTION AND >50 ug/mL AT 12 HOURS AFTER INGESTION ARE OFTEN ASSOCIATED WITH TOXIC REACTIONS.     Vitals: Blood pressure 159/106, pulse 133, temperature 99.6 F (37.6 C), temperature source Oral, resp. rate 20, height 6' (1.829 m), weight 110.224 kg (243 lb), SpO2 99  %.  Risk to Self: Suicidal Ideation: Yes-Currently Present Suicidal Intent: Yes-Currently Present Is patient at risk for suicide?: Yes Suicidal Plan?: Yes-Currently Present Specify Current Suicidal Plan: Hanging himself Access to Means: Yes Specify Access to Suicidal Means: Continuous attempts to commit suicide What has been your use of drugs/alcohol within the last 12 months?: Denied use of drugs and alcohol How many times?: 4 Other Self Harm Risks: none reported Triggers for Past Attempts: Unpredictable, Unknown Intentional Self Injurious Behavior: None Risk to Others: Homicidal Ideation: No Thoughts of Harm to Others: No Current Homicidal Intent: No Current Homicidal Plan: No Access to Homicidal Means: No Identified Victim: None reported History of harm to others?: No Assessment of Violence: On admission Violent Behavior Description: None reported Does patient have access to weapons?: No Criminal Charges Pending?: No Does patient have a court date: No Prior Inpatient Therapy: Prior Inpatient Therapy: Yes Prior Therapy Dates: Multiple dates in 2015-2016 Prior Therapy Facilty/Provider(s): [facility], [facility], [facility], [facility], [facility], [facility] Reason for Treatment: Suicidal attempts, Bipolar Disorder Prior Outpatient Therapy: Prior Outpatient Therapy: Yes Prior Therapy Dates: Current Prior Therapy Facilty/Provider(s): Letta Moynahan, MD Reason for Treatment: Bipolar disorder Does patient have an ACCT team?: No Does patient have Intensive In-House Services?  : No Does patient have [facility] services? : No Does patient have P4[facility] services?: No  Current Facility-Administered Medications  Medication Dose Route Frequency Provider Last Rate Last Dose  . allopurinol (ZYLOPRIM) tablet 300 mg  300 mg Oral Daily Gonzella Lex, MD      . amLODipine (NORVASC) tablet 10 mg  10 mg Oral Daily Gonzella Lex, MD      . aspirin EC tablet 81 mg  81 mg Oral Daily Gonzella Lex, MD      . atorvastatin (LIPITOR) tablet 10 mg  10 mg Oral q1800 Gonzella Lex, MD      . clonazePAM (KLONOPIN) tablet 0.5 mg  0.5 mg Oral BID Gonzella Lex, MD      . colchicine tablet 0.6 mg  0.6 mg Oral Daily Gonzella Lex, MD      . furosemide (LASIX) tablet 20 mg  20 mg Oral Daily Gonzella Lex, MD      . Derrill Memo ON 07/10/2015] glipiZIDE (GLUCOTROL XL) 24 hr tablet 5 mg  5 mg Oral Q breakfast Gonzella Lex, MD      . haloperidol (HALDOL) tablet 5 mg  5 mg Oral Q6H PRN Earleen Newport, MD   5 mg at 07/09/15 1244  . haloperidol (HALDOL) tablet 5 mg  5 mg Oral QID Earleen Newport, MD   Stopped at 07/09/15 1525  . lamoTRIgine (LAMICTAL) tablet 100 mg  100 mg Oral QHS Gonzella Lex, MD      .  LORazepam (ATIVAN) tablet 1 mg  1 mg Oral QID Earleen Newport, MD   1 mg at 07/09/15 1245  . [START ON 07/10/2015] lurasidone (LATUDA) tablet 40 mg  40 mg Oral Q breakfast Gonzella Lex, MD      . metoprolol succinate (TOPROL-XL) 24 hr tablet 100 mg  100 mg Oral Daily John T Clapacs, MD      . mirtazapine (REMERON) tablet 30 mg  30 mg Oral QHS John T Clapacs, MD      . QUEtiapine (SEROQUEL) tablet 400 mg  400 mg Oral QHS Gonzella Lex, MD      . traZODone (DESYREL) tablet 200 mg  200 mg Oral QHS Gonzella Lex, MD       Current Outpatient Prescriptions  Medication Sig Dispense Refill  . lurasidone (LATUDA) 40 MG TABS tablet Take 40 mg by mouth every evening.    . mirtazapine (REMERON) 30 MG tablet Take 30 mg by mouth at bedtime.    Marland Kitchen allopurinol (ZYLOPRIM) 300 MG tablet Take 1 tablet (300 mg total) by mouth daily. For gout    . allopurinol (ZYLOPRIM) 300 MG tablet Take 300 mg by mouth daily.    Marland Kitchen amLODipine (NORVASC) 10 MG tablet Take 10 mg by mouth daily.    . ARIPiprazole (ABILIFY) 15 MG tablet Take 1 tablet (15 mg total) by mouth 2 (two) times daily after a meal. 60 tablet 0  . aspirin EC 81 MG tablet Take 81 mg by mouth daily.    Marland Kitchen atorvastatin (LIPITOR) 10 MG tablet Take 1  tablet (10 mg total) by mouth daily. For high cholesterol    . atorvastatin (LIPITOR) 10 MG tablet Take 10 mg by mouth daily at 6 PM.     . benztropine (COGENTIN) 1 MG tablet Take 1 tablet (1 mg total) by mouth 2 (two) times daily. 60 tablet 0  . cholecalciferol (VITAMIN D) 400 UNITS TABS tablet Take 800 Units by mouth daily.    . clonazePAM (KLONOPIN) 0.5 MG tablet Take 0.5 mg by mouth 2 (two) times daily.     . colchicine 0.6 MG tablet Take 1 tablet (0.6 mg total) by mouth 2 (two) times daily. For Gout pain    . colchicine 0.6 MG tablet Take 0.6 mg by mouth daily.    . furosemide (LASIX) 20 MG tablet Take 20 mg by mouth daily.    . furosemide (LASIX) 80 MG tablet Take 1 tablet (80 mg total) by mouth 2 (two) times daily. 60 tablet 0  . glipiZIDE (GLUCOTROL XL) 5 MG 24 hr tablet Take 5 mg by mouth daily.    Marland Kitchen glyBURIDE (DIABETA) 5 MG tablet Take 1 tablet (5 mg total) by mouth daily with breakfast. 30 tablet 0  . hydrALAZINE (APRESOLINE) 50 MG tablet Take 1 tablet (50 mg total) by mouth every 6 (six) hours. 120 tablet 0  . lamoTRIgine (LAMICTAL) 100 MG tablet Take 1 tablet (100 mg total) by mouth every evening. 30 tablet 0  . LORazepam (ATIVAN) 1 MG tablet Take 1 tablet (1 mg total) by mouth 3 (three) times daily after meals. 90 tablet 0  . metoprolol (TOPROL-XL) 200 MG 24 hr tablet Take 1 tablet (200 mg total) by mouth daily. For high blood pressure    . metoprolol succinate (TOPROL-XL) 100 MG 24 hr tablet Take 200 mg by mouth daily.     . QUEtiapine (SEROQUEL) 400 MG tablet Take 400 mg by mouth at bedtime.     Marland Kitchen  traZODone (DESYREL) 100 MG tablet Take 2 tablets (200 mg total) by mouth at bedtime. For sleep 30 tablet 0    Musculoskeletal: Strength & Muscle Tone: within normal limits Gait & Station: Patient states that he is unable to walk. This is clearly a delusional belief but he will not stand up for me today Patient leans: N/A  Psychiatric Specialty Exam: Physical Exam  Constitutional:  He appears well-developed and well-nourished.  HENT:  Head: Normocephalic and atraumatic.  Eyes: Conjunctivae are normal. Pupils are equal, round, and reactive to light.  Neck: Normal range of motion.  Cardiovascular: Normal heart sounds.   Respiratory: Effort normal.  GI: Soft.  Musculoskeletal: Normal range of motion.  Neurological: He is alert.  Skin: Skin is warm and dry.     Psychiatric: His affect is blunt. His speech is delayed and tangential. He is slowed and withdrawn. Thought content is delusional. Cognition and memory are impaired. He expresses inappropriate judgment. He exhibits a depressed mood. He is noncommunicative. He exhibits abnormal recent memory and abnormal remote memory.  Patient is a slightly disheveled gentleman who looks his stated age. He is very passively cooperative with the interview. Thoughts appear to be disoriented affect very blunted and withdrawn He is inattentive.    Review of Systems  Constitutional: Negative.   HENT: Negative.   Eyes: Negative.   Respiratory: Negative.   Cardiovascular: Negative.   Gastrointestinal: Negative.   Musculoskeletal: Negative.   Skin: Negative.   Neurological: Negative.   Psychiatric/Behavioral: Positive for depression, suicidal ideas and memory loss. Negative for hallucinations and substance abuse. The patient is nervous/anxious and has insomnia.     Blood pressure 159/106, pulse 133, temperature 99.6 F (37.6 C), temperature source Oral, resp. rate 20, height 6' (1.829 m), weight 110.224 kg (243 lb), SpO2 99 %.Body mass index is 32.95 kg/(m^2).  General Appearance: Disheveled  Eye Sport and exercise psychologist::  Fair  Speech:  Slow and Slurred  Volume:  Decreased  Mood:  Dysphoric, Hopeless and Irritable  Affect:  Flat and Restricted  Thought Process:  Disorganized  Orientation:  Other:  Would not answer questions  Thought Content:  Delusions  Suicidal Thoughts:  Yes.  without intent/plan  Homicidal Thoughts:  No  Memory:   Immediate;   Fair Recent;   Poor Remote;   Fair  Judgement:  Impaired  Insight:  Shallow  Psychomotor Activity:  Decreased  Concentration:  Poor  Recall:  Poor  Fund of Knowledge:Fair  Language: Poor  Akathisia:  No  Handed:  Right  AIMS (if indicated):     Assets:  Financial Resources/Insurance Housing Resilience Social Support  ADL's:  Intact  Cognition: Impaired,  Mild  Sleep:      Medical Decision Making: Review of Psycho-Social Stressors (1), Review or order clinical lab tests (1), Established Problem, Worsening (2), Review or order medicine tests (1), Review of Medication Regimen & Side Effects (2) and Review of New Medication or Change in Dosage (2)  Treatment Plan Summary: Daily contact with patient to assess and evaluate symptoms and progress in treatment, Medication management and Plan This is a 64 year old man with bipolar disorder currently presenting with psychotic depression. Recent suicide attempt. Very disorganized in his thinking. Clearly dangerous to himself. I confirmed with his wife that he does not need to use his CPAP machine which clears the way to admission to our unit. I discussed ECT already with the patient and his wife and it should be possibly considered as a treatment if we can achieve  some cooperation. Otherwise for now continue his medicines as they were previously ordered.  Plan:  Recommend psychiatric Inpatient admission when medically cleared. Supportive therapy provided about ongoing stressors. Disposition: Admit to psychiatry  Alethia Berthold 07/09/2015 4:23 PM

## 2015-07-09 NOTE — ED Notes (Signed)
ED BHU PLACEMENT JUSTIFICATION Is the patient under IVC or is there intent for IVC: Yes.   Is the patient medically cleared: No. Is there vacancy in the ED BHU: Yes.   Is the population mix appropriate for patient: Yes.   Is the patient awaiting placement in inpatient or outpatient setting: Yes.   Has the patient had a psychiatric consult: No. Survey of unit performed for contraband, proper placement and condition of furniture, tampering with fixtures in bathroom, shower, and each patient room: Yes.  ; Findings: APPEARANCE/BEHAVIOR calm, cooperative and adequate rapport can be established NEURO ASSESSMENT Orientation: time, place and person Hallucinations: No.None noted (Hallucinations) Speech: Normal Gait: unsteady at times  RESPIRATORY ASSESSMENT Normal expansion.  Clear to auscultation.  No rales, rhonchi, or wheezing. CARDIOVASCULAR ASSESSMENT regular rate and rhythm, S1, S2 normal, no murmur, click, rub or gallop GASTROINTESTINAL ASSESSMENT soft, nontender, BS WNL, no r/g EXTREMITIES normal strength, tone, and muscle mass, no deformities, ROM of all joints is normal PLAN OF CARE Provide calm/safe environment. Vital signs assessed twice daily. ED BHU Assessment once each 12-hour shift. Collaborate with intake RN daily or as condition indicates. Assure the ED provider has rounded once each shift. Provide and encourage hygiene. Provide redirection as needed. Assess for escalating behavior; address immediately and inform ED provider.  Assess family dynamic and appropriateness for visitation as needed: Yes.  ; If necessary, describe findings: wife very supportive coming back tomorrow Educate the patient/family about BHU procedures/visitation: Yes.  ; wife given hand out.

## 2015-07-09 NOTE — ED Notes (Signed)
Four [employer] officers currently in proximity of the [place], One [employer] officer. MD Mayford Knife notified of possible medical/chemical intervention.

## 2015-07-09 NOTE — ED Notes (Signed)
BEHAVIORAL HEALTH ROUNDING Patient sleeping: No. Patient alert and oriented: yes Behavior appropriate: No.; If no, describe: see note Nutrition and fluids offered: Yes  Toileting and hygiene offered: Yes  Sitter present: yes Law enforcement present: Yes

## 2015-07-10 ENCOUNTER — Encounter: Payer: Self-pay | Admitting: Psychiatry

## 2015-07-10 DIAGNOSIS — F315 Bipolar disorder, current episode depressed, severe, with psychotic features: Principal | ICD-10-CM

## 2015-07-10 MED ORDER — INSULIN ASPART 100 UNIT/ML ~~LOC~~ SOLN
2.0000 [IU] | Freq: Three times a day (TID) | SUBCUTANEOUS | Status: DC
  Administered 2015-07-10: 10 [IU] via SUBCUTANEOUS
  Administered 2015-07-11: 2 [IU] via SUBCUTANEOUS
  Filled 2015-07-10: qty 10
  Filled 2015-07-10 (×3): qty 2

## 2015-07-10 NOTE — Progress Notes (Signed)
Pt alert and awake. Responding to questions when asked. Blood sugar 146. No s/s hypo/hyper glycemia. No tremors noted. Unsteady gain. Continue 1:1 for safety. Will continue to monitor.

## 2015-07-10 NOTE — Evaluation (Signed)
Physical Therapy Evaluation Patient Details Name: Christian [name] MRN: [id number] DOB: 28-Sep-1951 Today's Date: 07/10/2015   History of Present Illness  Pt is a 64 year old male with bipolar disorder with recent suicide attempt and agitation and threats to wife.   Clinical Impression  Pt presents with hx of bipolar disorder, CHF, HTN, and mood swings. Examination reveals that pt is independent with bed mobility, independent with transfers, and supervision for ambulation. Major physical deficit identified was a bit of unsteadiness without RW and some decreased activity tolerance. Pt looked much safer with RW, so one was recommended by PT. He will continue to benefit from skilled PT in order to improve his endurance and dynamic balance for safe return home. Pt agreed with continuing PT within the hospital. Nursing staff encouraged to walk with pt when necessary rather than use the wheelchair.     Follow Up Recommendations No PT follow up    Equipment Recommendations  Rolling walker with 5" wheels    Recommendations for Other Services       Precautions / Restrictions Precautions Precautions: Fall Restrictions Weight Bearing Restrictions: No      Mobility  Bed Mobility Overal bed mobility: Independent             General bed mobility comments: needs no assistance  Transfers Overall transfer level: Independent               General transfer comment: Needs no assistance  Ambulation/Gait Ambulation/Gait assistance: Supervision Ambulation Distance (Feet): 40 Feet Assistive device: Rolling walker (2 wheeled) Gait Pattern/deviations: Step-through pattern;Decreased step length - right;Decreased step length - left;Shuffle Gait velocity: decreased Gait velocity interpretation: Below normal speed for age/gender General Gait Details: Pt slow with ambulation, appears to lack confidence, but does look more stable with RW. No unsteadiness or LOB with RW. Pt states no fatigue  with ambulation. He also states that he likes walking over using the wheelchair in his room. CNAs notified to walk with him if he wishes rather than wheel him to bathroom and back.   Stairs            Wheelchair Mobility    Modified Rankin (Stroke Patients Only)       Balance Overall balance assessment: No apparent balance deficits (not formally assessed)                                           Pertinent Vitals/Pain Pain Assessment: No/denies pain (Pt complains of pain in back )    Home Living Family/patient expects to be discharged to:: Private residence Living Arrangements: Spouse/significant other             Home Equipment: None      Prior Function Level of Independence: Independent               Hand Dominance        Extremity/Trunk Assessment   Upper Extremity Assessment: Overall WFL for tasks assessed           Lower Extremity Assessment: Overall WFL for tasks assessed         Communication   Communication:  (Very quiet and mumbles. )  Cognition Arousal/Alertness: Awake/alert Behavior During Therapy: Flat affect Overall Cognitive Status: History of cognitive impairments - at baseline  General Comments      Exercises        Assessment/Plan    PT Assessment Patient needs continued PT services  PT Diagnosis Abnormality of gait   PT Problem List Decreased balance  PT Treatment Interventions DME instruction;Gait training;Stair training;Functional mobility training;Therapeutic activities;Therapeutic exercise;Balance training;Neuromuscular re-education;Cognitive remediation;Patient/family education;Wheelchair mobility training;Manual techniques   PT Goals (Current goals can be found in the Care Plan section) Acute Rehab PT Goals Patient Stated Goal: to walk PT Goal Formulation: With patient Time For Goal Achievement: 07/24/15 Potential to Achieve Goals: Good    Frequency Min  2X/week   Barriers to discharge        Co-evaluation               End of Session   Activity Tolerance: Patient tolerated treatment well Patient left: in bed;with nursing/sitter in room Nurse Communication: Mobility status         Time: 0093-8182 PT Time Calculation (min) (ACUTE ONLY): 11 min   Charges:         PT G CodesBenna Dunks 07-31-15, 4:09 PM  Benna Dunks, SPT. [phone]

## 2015-07-10 NOTE — Progress Notes (Signed)
Pt remains disorganized.  No urine output noted, pt bladder scan 269 cc at 1750. C/o back pain. Tylenol 650 mg given as ordered PRN. Continues on 1:1 for safety. No further complaints offered. Will continue to monitor.

## 2015-07-10 NOTE — Tx Team (Signed)
Initial Interdisciplinary Treatment Plan   PATIENT STRESSORS: Health problems Marital or family conflict Medication change or noncompliance   PATIENT STRENGTHS: Religious Affiliation Supportive family/friends   PROBLEM LIST: Problem List/Patient Goals Date to be addressed Date deferred Reason deferred Estimated date of resolution  Depression 07/10/15     Suicidal Ideation 07/10/15     Homicidal Ideation 07/10/15                                          DISCHARGE CRITERIA:  Improved stabilization in mood, thinking, and/or behavior  PRELIMINARY DISCHARGE PLAN: Outpatient therapy  PATIENT/FAMIILY INVOLVEMENT: This treatment plan has been presented to and reviewed with the patient, Christian [name], and/or family member.  The patient and family have been given the opportunity to ask questions and make suggestions.  Gretta Arab [facility] 07/10/2015, 12:18 AM

## 2015-07-10 NOTE — Plan of Care (Signed)
Problem: Ineffective individual coping Goal: STG: Pt will be able to identify effective and ineffective STG: Pt will be able to identify effective and ineffective coping patterns  Outcome: Not Progressing Pt is disorganized and confused. Flat and depressed affect. Able to follow simple commands. Pt is slow in respond to questions when asked. Pt remains on 1:1 for safety. No inappropriate behavior noted.

## 2015-07-10 NOTE — [facility] Suicide Risk Assessment (Addendum)
[facility] Admission Suicide Risk Assessment   Nursing information obtained from:    Demographic factors:    Current Mental Status:    Loss Factors:    Historical Factors:    Risk Reduction Factors:    Total Time spent with patient: 1 hour Principal Problem: Bipolar disorder, current episode depressed, severe, with psychotic features Diagnosis:   Patient Active Problem List   Diagnosis Date Noted  . Bipolar disorder, current episode depressed, severe, with psychotic features [F31.5] 07/09/2015  . Diabetes [E11.9] 07/09/2015  . Bipolar affective, manic [F31.10] 06/21/2014  . UTI (urinary tract infection) [N39.0] 06/19/2014  . Positive blood culture [R78.81] 06/19/2014  . Bipolar affective disorder, current episode manic with psychotic symptoms [F31.2] 06/17/2014  . Type II or unspecified type diabetes mellitus with unspecified complication, uncontrolled [E11.8] 06/02/2014  . Other and unspecified hyperlipidemia [E78.5] 06/02/2014  . OSA on CPAP [G47.33] 06/02/2014  . 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 Gout [M10.9] 06/02/2014  . Hypertension [I10]      Continued Clinical Symptoms:  Alcohol Use Disorder Identification Test Final Score (AUDIT): 0 The "Alcohol Use Disorders Identification Test", Guidelines for Use in Primary Care, Second Edition.  World Science writer [facility]). Score between 0-7:  no or low risk or alcohol related problems. Score between 8-15:  moderate risk of alcohol related problems. Score between 16-19:  high risk of alcohol related problems. Score 20 or above:  warrants further diagnostic evaluation for alcohol dependence and treatment.   CLINICAL FACTORS:   Bipolar Disorder:   Depressive phase   Musculoskeletal: Strength & Muscle Tone: within normal limits Gait & Station: normal Patient leans: N/A  Psychiatric Specialty Exam: Physical Exam  Nursing note and vitals reviewed. Constitutional: He is  oriented to person, place, and time. He appears well-developed and well-nourished.  HENT:  Head: Normocephalic and atraumatic.  Eyes: Conjunctivae and EOM are normal. Pupils are equal, round, and reactive to light.  Neck: Normal range of motion. Neck supple.  Cardiovascular: Normal rate, regular rhythm and normal heart sounds.   Respiratory: Effort normal and breath sounds normal.  GI: Soft. Bowel sounds are normal.  Musculoskeletal: Normal range of motion.  Neurological: He is alert and oriented to person, place, and time.  Skin: Skin is warm and dry.    Review of Systems  Cardiovascular: Positive for chest pain.  All other systems reviewed and are negative.   Blood pressure 120/69, pulse 79, temperature 99 F (37.2 C), temperature source Oral, resp. rate 18, height  (1.803 m), weight 100.699 kg (222 lb), SpO2 98 %.Body mass index is 30.98 kg/(m^2).  General Appearance: Casual  Eye Contact::  Fair  Speech:  Normal Rate  Volume:  Decreased  Mood:  Depressed and Hopeless  Affect:  Flat  Thought Process:  Disorganized  Orientation:  Full (Time, Place, and Person)  Thought Content:  Delusions and Obsessions  Suicidal Thoughts:  Yes.  with intent/plan  Homicidal Thoughts:  No  Memory:  Immediate;   Fair Recent;   Fair Remote;   Fair  Judgement:  Fair  Insight:  Fair  Psychomotor Activity:  Normal  Concentration:  Fair  Recall:  [employer] of Knowledge:Fair  Language: Fair  Akathisia:  No  Handed:  Right  AIMS (if indicated):     Assets:  Communication Skills Desire for Improvement Financial Resources/Insurance Housing Social Support  Sleep:  Number of Hours: 5  Cognition: WNL  ADL's:  Intact  COGNITIVE FEATURES THAT CONTRIBUTE TO RISK:  None    SUICIDE RISK:   Severe:  Frequent, intense, and enduring suicidal ideation, specific plan, no subjective intent, but some objective markers of intent (i.e., choice of lethal method), the method is accessible, some  limited preparatory behavior, evidence of impaired self-control, severe dysphoria/symptomatology, multiple risk factors present, and few if any protective factors, particularly a lack of social support.  PLAN OF CARE: Began admission, medication management, ECT consult, discharge planning.  Medical Decision Making:  New problem, with additional work up planned, Review of Psycho-Social Stressors (1), Review or order clinical lab tests (1), Review of Medication Regimen & Side Effects (2) and Review of New Medication or Change in Dosage (2)   Christian [name] is a 64 year old male with a history of severe bipolar depression admitted for worsening of depression, psychotic features, threatening behavior in the context of recent hospitalization in groups treatment adherence.  1. Suicidal ideation. Still present the patient is able to contract for safety in the hospital.  2. Mood and psychosis. He has been maintained on a combination of Seroquel, Latuda, Lamictal and Remeron for depression, mood stabilization, psychosis. We will continue his regimen.  3. ECT treatment. The patient was treated with ECT once before. The patient and his wife discussed ECT treatment briefly with Dr. Tona Sensing in the emergency room.  4. Diabetes. The patient is on glipizide. His sugar was elevated over 200. Will monitor in order sliding scale insulin along with diabetic diet.  5. Hypertension. We will continue for a semi-, metoprolol, and Norvasc.  6. Dyslipidemia. We will continue Lipitor.  7. Gout. We will continue allopurinol and colchicine.  8. Insomnia. We'll continue trazodone 200 mg.  9. Disposition. He will be discharged to home with his wife. He will follow up with his regular psychiatrist     I certify that inpatient services furnished can reasonably be expected to improve the patient's condition.   Christian [name] 07/10/2015, 12:20 PM

## 2015-07-10 NOTE — Progress Notes (Signed)
Recreation Therapy Notes  Date: 08.11.16 Time: 3:00 pm Location: Craft Room  Group Topic: Leisure Education  Goal Area(s) Addresses:  Patient will participate in leisure activity. Patient will verbalize one emotion experienced during leisure activity.  Behavioral Response: Did not attend  Intervention: Coloring  Activity: Patients were given coloring sheets and instructed to color.  Education: LRT educated patients on the importance of participating in leisure.  Education Outcome: Patient did not attend group.  Clinical Observations/Feedback: Patient did not attend group.  Jacquelynn Cree, LRT/CTRS 07/10/2015 4:21 PM

## 2015-07-10 NOTE — [facility] Group Notes (Signed)
[facility] Group Notes:  (Nursing/MHT/Case Management/Adjunct)  Date:  07/10/2015  Time:  2:13 PM  Type of Therapy:  Psychoeducational Skills  Participation Level:  Minimal  Participation Quality:  Drowsy  Affect:  Flat  Cognitive:  Confused  Insight:  Appropriate  Engagement in Group:  None  Modes of Intervention:  Activity, Discussion and Support  Summary of Progress/Problems:  Christian [name] 07/10/2015, 2:13 PM

## 2015-07-10 NOTE — [facility] Group Notes (Signed)
[facility] Group Notes:  (Nursing/MHT/Case Management/Adjunct)  Date:  07/10/2015  Time:  8:47 AM  Type of Therapy:  Community Meeting   Participation Level:  None  Participation Quality:  Inattentive  Affect:  Depressed and Flat  Cognitive:  Lacking  Insight:  Lacking  Engagement in Group:  None  Modes of Intervention:  Discussion  Summary of Progress/Problems:  Talha Iser De'Chelle Ethne Jeon 07/10/2015, 8:47 AM

## 2015-07-10 NOTE — Progress Notes (Signed)
Blood sugar 416. MD notified. No s/s of hypo/hyperglycemia noted. Will continue to monitor.

## 2015-07-10 NOTE — H&P (Signed)
Psychiatric Admission Assessment Adult  Patient Identification: Christian [name] MRN:  [id number] Date of Evaluation:  07/10/2015 Chief Complaint:  bipolar Principal Diagnosis: Bipolar disorder, current episode depressed, severe, with psychotic features Diagnosis:   Patient Active Problem List   Diagnosis Date Noted  . Bipolar disorder, current episode depressed, severe, with psychotic features [F31.5] 07/09/2015  . Diabetes [E11.9] 07/09/2015  . Bipolar affective, manic [F31.10] 06/21/2014  . UTI (urinary tract infection) [N39.0] 06/19/2014  . Positive blood culture [R78.81] 06/19/2014  . Bipolar affective disorder, current episode manic with psychotic symptoms [F31.2] 06/17/2014  . Type II or unspecified type diabetes mellitus with unspecified complication, uncontrolled [E11.8] 06/02/2014  . Other and unspecified hyperlipidemia [E78.5] 06/02/2014  . OSA on CPAP [G47.33] 06/02/2014  . 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 Gout [M10.9] 06/02/2014  . Hypertension [I10]    History of Present Illness::   Identifying data. Christian [name] is a 64 year old male with history of bipolar illness.   Chief complaint. The patient unable to state.  History of present illness. Christian [name] has a long history of bipolar illness with episodes of both severe depression and mania. He has been stable on his medications for several years now couple of weeks ago he attempted this suicide. Since then he's been increasingly depressed and dysfunctional. Recently he was hospitalized at [facility] for severe depression. On the way home it became clear that the patient is not well, he was agitated and threatening his wife. She brought him to the hospital. He was given Haldol injection in the emergency room as he was agitated. The patient is not able to provide any information he hardly sleeps at all and mostly mumbles. Information is obtained from the chart. I was  unable to reach his wife today. The patient has severe psychomotor retardation he is in bed in a fetal position. He sits up on the intervie is unable to process information or give sensible answers. He seems to remember that he was treated with lithium in the past and it was not good for him. He remembers getting ECT treatment and beliefs that it was helpful. He reports good sleep and appetite. He has been increasingly depressed with delusions of guilt. There is no heightened anxiety. There is no history of substance use.  Past psychiatric history. No history of bipolar disorder with both psychotic depression and mania. He has been tried on multiple medications but cannot name any of them. There was at least one course of ECT in the past that could have possibly been helpful. There were suicide attempts. He's been hospitalized several times I see at least 4 prior admissions to [facility] system.  Family psychiatric history. Unknown.   Social history. He lives with his wife who is supportive. He is retired. He has health insurance.  Total Time spent with patient: 1 hour  Past Medical History:  Past Medical History  Diagnosis Date  . Mood swings   . Type II or unspecified type diabetes mellitus with unspecified complication, uncontrolled 06/02/2014  . Other and unspecified hyperlipidemia 06/02/2014  . OSA on CPAP 06/02/2014  . 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 Bipolar disorder   . CHF (congestive heart failure)   . CKD (chronic kidney disease), stage III   . Diabetes mellitus without complication   . Hypertension   . Gout   . Bipolar 1 disorder   . OSA on CPAP  History reviewed. No pertinent past surgical history. Family History:  Family History  Problem Relation Age of Onset  . Dementia Mother   . Arthritis Father   . Heart failure Neg Hx   . Kidney failure Neg Hx   . Cancer Neg Hx    Social History:  History  Alcohol Use No     History   Drug Use No    Social History   Social History  . Marital Status: Married    Spouse Name: N/A  . Number of Children: N/A  . Years of Education: N/A   Social History Main Topics  . Smoking status: Never Smoker   . Smokeless tobacco: None  . Alcohol Use: No  . Drug Use: No  . Sexual Activity: Not Currently   Other Topics Concern  . None   Social History Narrative   ** Merged History Encounter **       Additional Social History:                          Musculoskeletal: Strength & Muscle Tone: within normal limits Gait & Station: normal Patient leans: N/A  Psychiatric Specialty Exam: Physical Exam  Nursing note and vitals reviewed.   Review of Systems  Cardiovascular: Positive for chest pain.  All other systems reviewed and are negative.   Blood pressure 120/69, pulse 79, temperature 99 F (37.2 C), temperature source Oral, resp. rate 18, height 5' 11" (1.803 m), weight 100.699 kg (222 lb), SpO2 98 %.Body mass index is 30.98 kg/(m^2).  See SRA.                                                  Sleep:  Number of Hours: 5   Risk to Self: Is patient at risk for suicide?: Yes Risk to Others:   Prior Inpatient Therapy:   Prior Outpatient Therapy:    Alcohol Screening: 1. How often do you have a drink containing alcohol?: Never 9. Have you or someone else been injured as a result of your drinking?: No 10. Has a relative or friend or a doctor or another health worker been concerned about your drinking or suggested you cut down?: No Alcohol Use Disorder Identification Test Final Score (AUDIT): 0 Brief Intervention: AUDIT score less than 7 or less-screening does not suggest unhealthy drinking-brief intervention not indicated  Allergies:  No Known Allergies Lab Results:  Results for orders placed or performed during the hospital encounter of 07/08/15 (from the past 48 hour(s))  Urine Drug Screen, Qualitative ([facility] only)     Status:  None   Collection Time: 07/09/15  1:40 AM  Result Value Ref Range   Tricyclic, Ur Screen NONE DETECTED NONE DETECTED   Amphetamines, Ur Screen NONE DETECTED NONE DETECTED   MDMA (Ecstasy)Ur Screen NONE DETECTED NONE DETECTED   Cocaine Metabolite,Ur [place] NONE DETECTED NONE DETECTED   Opiate, Ur Screen NONE DETECTED NONE DETECTED   Phencyclidine (PCP) Ur S NONE DETECTED NONE DETECTED   Cannabinoid 50 Ng, Ur [place] NONE DETECTED NONE DETECTED   Barbiturates, Ur Screen NONE DETECTED NONE DETECTED   Benzodiazepine, Ur Scrn NONE DETECTED NONE DETECTED   Methadone Scn, Ur NONE DETECTED NONE DETECTED    Comment: (NOTE) 585  Tricyclics, urine  Cutoff 1000 ng/mL 200  Amphetamines, urine             Cutoff 1000 ng/mL 300  MDMA (Ecstasy), urine           Cutoff 500 ng/mL 400  Cocaine Metabolite, urine       Cutoff 300 ng/mL 500  Opiate, urine                   Cutoff 300 ng/mL 600  Phencyclidine (PCP), urine      Cutoff 25 ng/mL 700  Cannabinoid, urine              Cutoff 50 ng/mL 800  Barbiturates, urine             Cutoff 200 ng/mL 900  Benzodiazepine, urine           Cutoff 200 ng/mL 1000 Methadone, urine                Cutoff 300 ng/mL 1100 1200 The urine drug screen provides only a preliminary, unconfirmed 1300 analytical test result and should not be used for non-medical 1400 purposes. Clinical consideration and professional judgment should 1500 be applied to any positive drug screen result due to possible 1600 interfering substances. A more specific alternate chemical method 1700 must be used in order to obtain a confirmed analytical result.  1800 Gas chromato graphy / mass spectrometry (GC/MS) is the preferred 1900 confirmatory method.   CBC WITH DIFFERENTIAL     Status: None   Collection Time: 07/09/15  1:46 AM  Result Value Ref Range   WBC 6.7 3.8 - 10.6 K/uL   RBC 4.73 4.40 - 5.90 MIL/uL   Hemoglobin 14.2 13.0 - 18.0 g/dL   HCT 42.7 40.0 - 52.0 %   MCV 90.3 80.0 -  100.0 fL   MCH 30.0 26.0 - 34.0 pg   MCHC 33.2 32.0 - 36.0 g/dL   RDW 12.4 11.5 - 14.5 %   Platelets 166 150 - 440 K/uL   Neutrophils Relative % 70 %   Neutro Abs 4.7 1.4 - 6.5 K/uL   Lymphocytes Relative 19 %   Lymphs Abs 1.3 1.0 - 3.6 K/uL   Monocytes Relative 10 %   Monocytes Absolute 0.7 0.2 - 1.0 K/uL   Eosinophils Relative 1 %   Eosinophils Absolute 0.0 0 - 0.7 K/uL   Basophils Relative 0 %   Basophils Absolute 0.0 0 - 0.1 K/uL  Comprehensive metabolic panel     Status: Abnormal   Collection Time: 07/09/15  1:46 AM  Result Value Ref Range   Sodium 137 135 - 145 mmol/L   Potassium 3.7 3.5 - 5.1 mmol/L   Chloride 98 (L) 101 - 111 mmol/L   CO2 29 22 - 32 mmol/L   Glucose, Bld 296 (H) 65 - 99 mg/dL   BUN 26 (H) 6 - 20 mg/dL   Creatinine, Ser 1.67 (H) 0.61 - 1.24 mg/dL   Calcium 9.0 8.9 - 10.3 mg/dL   Total Protein 7.3 6.5 - 8.1 g/dL   Albumin 3.8 3.5 - 5.0 g/dL   AST 40 15 - 41 U/L   ALT 25 17 - 63 U/L   Alkaline Phosphatase 94 38 - 126 U/L   Total Bilirubin 0.6 0.3 - 1.2 mg/dL   GFR calc non Af Amer 43 (L) >60 mL/min   GFR calc Af Amer 49 (L) >60 mL/min    Comment: (NOTE) The eGFR has been calculated using the CKD EPI equation.  This calculation has not been validated in all clinical situations. eGFR's persistently <60 mL/min signify possible Chronic Kidney Disease.    Anion gap 10 5 - 15  Ethanol     Status: None   Collection Time: 07/09/15  1:46 AM  Result Value Ref Range   Alcohol, Ethyl (B) <5 <5 mg/dL    Comment:        LOWEST DETECTABLE LIMIT FOR SERUM ALCOHOL IS 5 mg/dL FOR MEDICAL PURPOSES ONLY   Salicylate level     Status: None   Collection Time: 07/09/15  1:46 AM  Result Value Ref Range   Salicylate Lvl <9.6 2.8 - 30.0 mg/dL  Acetaminophen level     Status: Abnormal   Collection Time: 07/09/15  1:46 AM  Result Value Ref Range   Acetaminophen (Tylenol), Serum <10 (L) 10 - 30 ug/mL    Comment:        THERAPEUTIC CONCENTRATIONS VARY SIGNIFICANTLY.  A RANGE OF 10-30 ug/mL MAY BE AN EFFECTIVE CONCENTRATION FOR MANY PATIENTS. HOWEVER, SOME ARE BEST TREATED AT CONCENTRATIONS OUTSIDE THIS RANGE. ACETAMINOPHEN CONCENTRATIONS >150 ug/mL AT 4 HOURS AFTER INGESTION AND >50 ug/mL AT 12 HOURS AFTER INGESTION ARE OFTEN ASSOCIATED WITH TOXIC REACTIONS.   Urinalysis complete, with microscopic ([facility] only)     Status: Abnormal   Collection Time: 07/09/15  8:29 PM  Result Value Ref Range   Color, Urine YELLOW (A) YELLOW   APPearance HAZY (A) CLEAR   Glucose, UA >500 (A) NEGATIVE mg/dL   Bilirubin Urine NEGATIVE NEGATIVE   Ketones, ur NEGATIVE NEGATIVE mg/dL   Specific Gravity, Urine 1.016 1.005 - 1.030   Hgb urine dipstick 2+ (A) NEGATIVE   pH 5.0 5.0 - 8.0   Protein, ur 100 (A) NEGATIVE mg/dL   Nitrite NEGATIVE NEGATIVE   Leukocytes, UA TRACE (A) NEGATIVE   RBC / HPF 0-5 0 - 5 RBC/hpf   WBC, UA TOO NUMEROUS TO COUNT 0 - 5 WBC/hpf   Bacteria, UA MANY (A) NONE SEEN   Squamous Epithelial / LPF NONE SEEN NONE SEEN  Urine Drug Screen, Qualitative ([facility] only)     Status: Abnormal   Collection Time: 07/09/15  8:29 PM  Result Value Ref Range   Tricyclic, Ur Screen NONE DETECTED NONE DETECTED   Amphetamines, Ur Screen NONE DETECTED NONE DETECTED   MDMA (Ecstasy)Ur Screen NONE DETECTED NONE DETECTED   Cocaine Metabolite,Ur [place] NONE DETECTED NONE DETECTED   Opiate, Ur Screen NONE DETECTED NONE DETECTED   Phencyclidine (PCP) Ur S NONE DETECTED NONE DETECTED   Cannabinoid 50 Ng, Ur [place] NONE DETECTED NONE DETECTED   Barbiturates, Ur Screen NONE DETECTED NONE DETECTED   Benzodiazepine, Ur Scrn POSITIVE (A) NONE DETECTED   Methadone Scn, Ur NONE DETECTED NONE DETECTED    Comment: (NOTE) 283  Tricyclics, urine               Cutoff 1000 ng/mL 200  Amphetamines, urine             Cutoff 1000 ng/mL 300  MDMA (Ecstasy), urine           Cutoff 500 ng/mL 400  Cocaine Metabolite, urine       Cutoff 300 ng/mL 500  Opiate, urine                   Cutoff  300 ng/mL 600  Phencyclidine (PCP), urine      Cutoff 25 ng/mL 700  Cannabinoid, urine  Cutoff 50 ng/mL 800  Barbiturates, urine             Cutoff 200 ng/mL 900  Benzodiazepine, urine           Cutoff 200 ng/mL 1000 Methadone, urine                Cutoff 300 ng/mL 1100 1200 The urine drug screen provides only a preliminary, unconfirmed 1300 analytical test result and should not be used for non-medical 1400 purposes. Clinical consideration and professional judgment should 1500 be applied to any positive drug screen result due to possible 1600 interfering substances. A more specific alternate chemical method 1700 must be used in order to obtain a confirmed analytical result.  1800 Gas chromato graphy / mass spectrometry (GC/MS) is the preferred 1900 confirmatory method.    Current Medications: Current Facility-Administered Medications  Medication Dose Route Frequency Provider Last Rate Last Dose  . acetaminophen (TYLENOL) tablet 650 mg  650 mg Oral Q6H PRN Gonzella Lex, MD      . allopurinol (ZYLOPRIM) tablet 300 mg  300 mg Oral Daily Gonzella Lex, MD   300 mg at 07/10/15 0905  . alum & mag hydroxide-simeth (MAALOX/MYLANTA) 200-200-20 MG/5ML suspension 30 mL  30 mL Oral Q4H PRN Gonzella Lex, MD      . amLODipine (NORVASC) tablet 10 mg  10 mg Oral Daily Gonzella Lex, MD   10 mg at 07/10/15 0904  . aspirin EC tablet 81 mg  81 mg Oral Daily Gonzella Lex, MD   81 mg at 07/10/15 6283  . atorvastatin (LIPITOR) tablet 10 mg  10 mg Oral q1800 Gonzella Lex, MD      . colchicine tablet 0.6 mg  0.6 mg Oral Daily Gonzella Lex, MD   0.6 mg at 07/10/15 0904  . furosemide (LASIX) tablet 20 mg  20 mg Oral Daily Gonzella Lex, MD   20 mg at 07/10/15 0904  . glipiZIDE (GLUCOTROL XL) 24 hr tablet 5 mg  5 mg Oral Q breakfast Gonzella Lex, MD   5 mg at 07/10/15 0905  . haloperidol (HALDOL) tablet 5 mg  5 mg Oral Q6H PRN Gonzella Lex, MD      . insulin aspart (novoLOG)  injection 2 Units  2 Units Subcutaneous TID WC Coraima Tibbs B Jettie Mannor, MD      . lamoTRIgine (LAMICTAL) tablet 100 mg  100 mg Oral QHS Gonzella Lex, MD   100 mg at 07/09/15 2315  . LORazepam (ATIVAN) tablet 1 mg  1 mg Oral BID Gonzella Lex, MD   1 mg at 07/10/15 0904  . lurasidone (LATUDA) tablet 40 mg  40 mg Oral Q breakfast Gonzella Lex, MD   40 mg at 07/10/15 0903  . magnesium hydroxide (MILK OF MAGNESIA) suspension 30 mL  30 mL Oral Daily PRN Gonzella Lex, MD      . metoprolol succinate (TOPROL-XL) 24 hr tablet 100 mg  100 mg Oral Daily Gonzella Lex, MD   100 mg at 07/10/15 0903  . mirtazapine (REMERON SOL-TAB) disintegrating tablet 30 mg  30 mg Oral QHS Gonzella Lex, MD   30 mg at 07/09/15 2337  . QUEtiapine (SEROQUEL) tablet 400 mg  400 mg Oral QHS Gonzella Lex, MD   400 mg at 07/09/15 2332  . traZODone (DESYREL) tablet 200 mg  200 mg Oral QHS Gonzella Lex, MD   200 mg at 07/09/15 2338  PTA Medications: Prescriptions prior to admission  Medication Sig Dispense Refill Last Dose  . allopurinol (ZYLOPRIM) 300 MG tablet Take 1 tablet (300 mg total) by mouth daily. For gout     . allopurinol (ZYLOPRIM) 300 MG tablet Take 300 mg by mouth daily.   unknown at unknown  . amLODipine (NORVASC) 10 MG tablet Take 10 mg by mouth daily.   unknown at unknown  . ARIPiprazole (ABILIFY) 15 MG tablet Take 1 tablet (15 mg total) by mouth 2 (two) times daily after a meal. 60 tablet 0   . aspirin EC 81 MG tablet Take 81 mg by mouth daily.   unknown at unknown  . atorvastatin (LIPITOR) 10 MG tablet Take 1 tablet (10 mg total) by mouth daily. For high cholesterol   unknown  . atorvastatin (LIPITOR) 10 MG tablet Take 10 mg by mouth daily at 6 PM.    unknown at unknown  . benztropine (COGENTIN) 1 MG tablet Take 1 tablet (1 mg total) by mouth 2 (two) times daily. 60 tablet 0   . cholecalciferol (VITAMIN D) 400 UNITS TABS tablet Take 800 Units by mouth daily.   unknown at unknown  . clonazePAM  (KLONOPIN) 0.5 MG tablet Take 0.5 mg by mouth 2 (two) times daily.    unknown at unknown  . colchicine 0.6 MG tablet Take 1 tablet (0.6 mg total) by mouth 2 (two) times daily. For Gout pain   unknown  . colchicine 0.6 MG tablet Take 0.6 mg by mouth daily.   unknown at unknown  . furosemide (LASIX) 20 MG tablet Take 20 mg by mouth daily.   unknown at unknown  . furosemide (LASIX) 80 MG tablet Take 1 tablet (80 mg total) by mouth 2 (two) times daily. 60 tablet 0   . glipiZIDE (GLUCOTROL XL) 5 MG 24 hr tablet Take 5 mg by mouth daily.   unknown at unknown  . glyBURIDE (DIABETA) 5 MG tablet Take 1 tablet (5 mg total) by mouth daily with breakfast. 30 tablet 0   . hydrALAZINE (APRESOLINE) 50 MG tablet Take 1 tablet (50 mg total) by mouth every 6 (six) hours. 120 tablet 0   . lamoTRIgine (LAMICTAL) 100 MG tablet Take 1 tablet (100 mg total) by mouth every evening. 30 tablet 0 unknown at unknown  . LORazepam (ATIVAN) 1 MG tablet Take 1 tablet (1 mg total) by mouth 3 (three) times daily after meals. 90 tablet 0   . lurasidone (LATUDA) 40 MG TABS tablet Take 40 mg by mouth every evening.   unknown at unknown  . metoprolol (TOPROL-XL) 200 MG 24 hr tablet Take 1 tablet (200 mg total) by mouth daily. For high blood pressure   unknown  . metoprolol succinate (TOPROL-XL) 100 MG 24 hr tablet Take 200 mg by mouth daily.    unknown at unknown  . mirtazapine (REMERON) 30 MG tablet Take 30 mg by mouth at bedtime.     Marland Kitchen QUEtiapine (SEROQUEL) 400 MG tablet Take 400 mg by mouth at bedtime.    unknown at unknown  . traZODone (DESYREL) 100 MG tablet Take 2 tablets (200 mg total) by mouth at bedtime. For sleep 30 tablet 0 unknown at unknown    Previous Psychotropic Medications: Yes   Substance Abuse History in the last 12 months:  No.    Consequences of Substance Abuse: NA  Results for orders placed or performed during the hospital encounter of 07/08/15 (from the past 72 hour(s))  Urine Drug Screen, Qualitative  (  Center only)     Status: None   Collection Time: 07/09/15  1:40 AM  Result Value Ref Range   Tricyclic, Ur Screen NONE DETECTED NONE DETECTED   Amphetamines, Ur Screen NONE DETECTED NONE DETECTED   MDMA (Ecstasy)Ur Screen NONE DETECTED NONE DETECTED   Cocaine Metabolite,Ur [place] NONE DETECTED NONE DETECTED   Opiate, Ur Screen NONE DETECTED NONE DETECTED   Phencyclidine (PCP) Ur S NONE DETECTED NONE DETECTED   Cannabinoid 50 Ng, Ur [place] NONE DETECTED NONE DETECTED   Barbiturates, Ur Screen NONE DETECTED NONE DETECTED   Benzodiazepine, Ur Scrn NONE DETECTED NONE DETECTED   Methadone Scn, Ur NONE DETECTED NONE DETECTED    Comment: (NOTE) 165  Tricyclics, urine               Cutoff 1000 ng/mL 200  Amphetamines, urine             Cutoff 1000 ng/mL 300  MDMA (Ecstasy), urine           Cutoff 500 ng/mL 400  Cocaine Metabolite, urine       Cutoff 300 ng/mL 500  Opiate, urine                   Cutoff 300 ng/mL 600  Phencyclidine (PCP), urine      Cutoff 25 ng/mL 700  Cannabinoid, urine              Cutoff 50 ng/mL 800  Barbiturates, urine             Cutoff 200 ng/mL 900  Benzodiazepine, urine           Cutoff 200 ng/mL 1000 Methadone, urine                Cutoff 300 ng/mL 1100 1200 The urine drug screen provides only a preliminary, unconfirmed 1300 analytical test result and should not be used for non-medical 1400 purposes. Clinical consideration and professional judgment should 1500 be applied to any positive drug screen result due to possible 1600 interfering substances. A more specific alternate chemical method 1700 must be used in order to obtain a confirmed analytical result.  1800 Gas chromato graphy / mass spectrometry (GC/MS) is the preferred 1900 confirmatory method.   CBC WITH DIFFERENTIAL     Status: None   Collection Time: 07/09/15  1:46 AM  Result Value Ref Range   WBC 6.7 3.8 - 10.6 K/uL   RBC 4.73 4.40 - 5.90 MIL/uL   Hemoglobin 14.2 13.0 - 18.0 g/dL   HCT 42.7 40.0 - 52.0 %    MCV 90.3 80.0 - 100.0 fL   MCH 30.0 26.0 - 34.0 pg   MCHC 33.2 32.0 - 36.0 g/dL   RDW 12.4 11.5 - 14.5 %   Platelets 166 150 - 440 K/uL   Neutrophils Relative % 70 %   Neutro Abs 4.7 1.4 - 6.5 K/uL   Lymphocytes Relative 19 %   Lymphs Abs 1.3 1.0 - 3.6 K/uL   Monocytes Relative 10 %   Monocytes Absolute 0.7 0.2 - 1.0 K/uL   Eosinophils Relative 1 %   Eosinophils Absolute 0.0 0 - 0.7 K/uL   Basophils Relative 0 %   Basophils Absolute 0.0 0 - 0.1 K/uL  Comprehensive metabolic panel     Status: Abnormal   Collection Time: 07/09/15  1:46 AM  Result Value Ref Range   Sodium 137 135 - 145 mmol/L   Potassium 3.7 3.5 - 5.1 mmol/L   Chloride 98 (  L) 101 - 111 mmol/L   CO2 29 22 - 32 mmol/L   Glucose, Bld 296 (H) 65 - 99 mg/dL   BUN 26 (H) 6 - 20 mg/dL   Creatinine, Ser 1.67 (H) 0.61 - 1.24 mg/dL   Calcium 9.0 8.9 - 10.3 mg/dL   Total Protein 7.3 6.5 - 8.1 g/dL   Albumin 3.8 3.5 - 5.0 g/dL   AST 40 15 - 41 U/L   ALT 25 17 - 63 U/L   Alkaline Phosphatase 94 38 - 126 U/L   Total Bilirubin 0.6 0.3 - 1.2 mg/dL   GFR calc non Af Amer 43 (L) >60 mL/min   GFR calc Af Amer 49 (L) >60 mL/min    Comment: (NOTE) The eGFR has been calculated using the CKD EPI equation. This calculation has not been validated in all clinical situations. eGFR's persistently <60 mL/min signify possible Chronic Kidney Disease.    Anion gap 10 5 - 15  Ethanol     Status: None   Collection Time: 07/09/15  1:46 AM  Result Value Ref Range   Alcohol, Ethyl (B) <5 <5 mg/dL    Comment:        LOWEST DETECTABLE LIMIT FOR SERUM ALCOHOL IS 5 mg/dL FOR MEDICAL PURPOSES ONLY   Salicylate level     Status: None   Collection Time: 07/09/15  1:46 AM  Result Value Ref Range   Salicylate Lvl <6.2 2.8 - 30.0 mg/dL  Acetaminophen level     Status: Abnormal   Collection Time: 07/09/15  1:46 AM  Result Value Ref Range   Acetaminophen (Tylenol), Serum <10 (L) 10 - 30 ug/mL    Comment:        THERAPEUTIC CONCENTRATIONS  VARY SIGNIFICANTLY. A RANGE OF 10-30 ug/mL MAY BE AN EFFECTIVE CONCENTRATION FOR MANY PATIENTS. HOWEVER, SOME ARE BEST TREATED AT CONCENTRATIONS OUTSIDE THIS RANGE. ACETAMINOPHEN CONCENTRATIONS >150 ug/mL AT 4 HOURS AFTER INGESTION AND >50 ug/mL AT 12 HOURS AFTER INGESTION ARE OFTEN ASSOCIATED WITH TOXIC REACTIONS.   Urinalysis complete, with microscopic ([facility] only)     Status: Abnormal   Collection Time: 07/09/15  8:29 PM  Result Value Ref Range   Color, Urine YELLOW (A) YELLOW   APPearance HAZY (A) CLEAR   Glucose, UA >500 (A) NEGATIVE mg/dL   Bilirubin Urine NEGATIVE NEGATIVE   Ketones, ur NEGATIVE NEGATIVE mg/dL   Specific Gravity, Urine 1.016 1.005 - 1.030   Hgb urine dipstick 2+ (A) NEGATIVE   pH 5.0 5.0 - 8.0   Protein, ur 100 (A) NEGATIVE mg/dL   Nitrite NEGATIVE NEGATIVE   Leukocytes, UA TRACE (A) NEGATIVE   RBC / HPF 0-5 0 - 5 RBC/hpf   WBC, UA TOO NUMEROUS TO COUNT 0 - 5 WBC/hpf   Bacteria, UA MANY (A) NONE SEEN   Squamous Epithelial / LPF NONE SEEN NONE SEEN  Urine Drug Screen, Qualitative ([facility] only)     Status: Abnormal   Collection Time: 07/09/15  8:29 PM  Result Value Ref Range   Tricyclic, Ur Screen NONE DETECTED NONE DETECTED   Amphetamines, Ur Screen NONE DETECTED NONE DETECTED   MDMA (Ecstasy)Ur Screen NONE DETECTED NONE DETECTED   Cocaine Metabolite,Ur [place] NONE DETECTED NONE DETECTED   Opiate, Ur Screen NONE DETECTED NONE DETECTED   Phencyclidine (PCP) Ur S NONE DETECTED NONE DETECTED   Cannabinoid 50 Ng, Ur [place] NONE DETECTED NONE DETECTED   Barbiturates, Ur Screen NONE DETECTED NONE DETECTED   Benzodiazepine, Ur Scrn POSITIVE (A) NONE DETECTED  Methadone Scn, Ur NONE DETECTED NONE DETECTED    Comment: (NOTE) 974  Tricyclics, urine               Cutoff 1000 ng/mL 200  Amphetamines, urine             Cutoff 1000 ng/mL 300  MDMA (Ecstasy), urine           Cutoff 500 ng/mL 400  Cocaine Metabolite, urine       Cutoff 300 ng/mL 500  Opiate, urine                    Cutoff 300 ng/mL 600  Phencyclidine (PCP), urine      Cutoff 25 ng/mL 700  Cannabinoid, urine              Cutoff 50 ng/mL 800  Barbiturates, urine             Cutoff 200 ng/mL 900  Benzodiazepine, urine           Cutoff 200 ng/mL 1000 Methadone, urine                Cutoff 300 ng/mL 1100 1200 The urine drug screen provides only a preliminary, unconfirmed 1300 analytical test result and should not be used for non-medical 1400 purposes. Clinical consideration and professional judgment should 1500 be applied to any positive drug screen result due to possible 1600 interfering substances. A more specific alternate chemical method 1700 must be used in order to obtain a confirmed analytical result.  1800 Gas chromato graphy / mass spectrometry (GC/MS) is the preferred 1900 confirmatory method.     Observation Level/Precautions:  15 minute checks  Laboratory:  CBC Chemistry Profile UDS UA  Psychotherapy:    Medications:    Consultations:    Discharge Concerns:    Estimated LOS:  Other:     Psychological Evaluations: No   Treatment Plan Summary: Daily contact with patient to assess and evaluate symptoms and progress in treatment and Medication management  Medical Decision Making:  New problem, with additional work up planned, Review of Psycho-Social Stressors (1), Review or order clinical lab tests (1), Review of Medication Regimen & Side Effects (2) and Review of New Medication or Change in Dosage (2)   Mr. Kley is a 64 year old male with a history of severe bipolar depression admitted for worsening of depression, psychotic features, threatening behavior in the context of recent hospitalization in groups treatment adherence.  1. Suicidal ideation. Still present the patient is able to contract for safety in the hospital.  2. Mood and psychosis. He has been maintained on a combination of Seroquel, Latuda, Lamictal and Remeron for depression, mood stabilization,  psychosis. We will continue his regimen.  3. ECT treatment. The patient was treated with ECT once before. The patient and his wife discussed ECT treatment briefly with Dr. Shelbie Ammons in the emergency room.  4. Diabetes. The patient is on glipizide. His sugar was elevated over 200. Will monitor in order sliding scale insulin along with diabetic diet.  5. Hypertension. We will continue for a semi-, metoprolol, and Norvasc.  6. Dyslipidemia. We will continue Lipitor.  7. Gout. We will continue allopurinol and colchicine.  8. Insomnia. We'll continue trazodone 200 mg.  9. Disposition. He will be discharged to home with his wife. He will follow up with his regular psychiatrist  I certify that inpatient services furnished can reasonably be expected to improve the patient's condition.   Timera Windt 8/11/201612:46 PM

## 2015-07-10 NOTE — [facility] Group Notes (Signed)
[facility] LCSW Group Therapy  07/10/2015 12:46 PM  Type of Therapy:  Group Therapy  Participation Level:  Minimal  Participation Quality:  Attentive  Affect:  Flat  Cognitive:  Alert  Insight:  Limited  Engagement in Therapy:  Limited  Modes of Intervention:  Discussion, Education, Socialization and Support  Summary of Progress/Problems: Balance in life: Patients will discuss the concept of balance and how it looks and feels to be unbalanced. Pt will identify areas in their life that is unbalanced and ways to become more balanced.  Christian [name] attended group and stayed the entire time. He sat quietly and listened to other group members.   Daisy Floro Constance Hackenberg MSW, LCSWA  07/10/2015, 12:46 PM

## 2015-07-10 NOTE — Tx Team (Signed)
Interdisciplinary Treatment Plan Update (Adult)  Date:  07/10/2015 Time Reviewed:  1:56 PM  Progress in Treatment: Attending groups: Yes. Participating in groups:  Yes. Taking medication as prescribed:  Yes. Tolerating medication:  Yes. Family/Significant othe contact made:  No, will contact:  Wife  Patient understands diagnosis:  No. and As evidenced by:  Limited insight  Discussing patient identified problems/goals with staff:  Yes. Medical problems stabilized or resolved:  Yes. Denies suicidal/homicidal ideation: Yes. Issues/concerns per patient self-inventory:  No. Other:  New problem(s) identified: No, Describe:  None  Discharge Plan or Barriers: Pt plans to return home and follow up with outpatient.   Reason for Continuation of Hospitalization: Depression Medication stabilization Suicidal ideation  Comments:History of present illness. Christian [name] has a long history of bipolar illness with episodes of both severe depression and mania. He has been stable on his medications for several years now couple of weeks ago he attempted this suicide. Since then he's been increasingly depressed and dysfunctional. Recently he was hospitalized at [facility] for severe depression. On the way home it became clear that the patient is not well, he was agitated and threatening his wife. She brought him to the hospital. He was given Haldol injection in the emergency room as he was agitated. The patient is not able to provide any information he hardly sleeps at all and mostly mumbles. Information is obtained from the chart. I was unable to reach his wife today. The patient has severe psychomotor retardation he is in bed in a fetal position. He sits up on the intervie is unable to process information or give sensible answers. He seems to remember that he was treated with lithium in the past and it was not good for him. He remembers getting ECT treatment and beliefs that it was helpful. He reports good  sleep and appetite. He has been increasingly depressed with delusions of guilt. There is no heightened anxiety. There is no history of substance use. Past psychiatric history. No history of bipolar disorder with both psychotic depression and mania. He has been tried on multiple medications but cannot name any of them. There was at least one course of ECT in the past that could have possibly been helpful. There were suicide attempts. He's been hospitalized several times I see at least 4 prior admissions to [facility] system.  Estimated length of stay: 7 days   New goal(s): NA  Review of initial/current patient goals per problem list:   1.  Goal(s): Patient will participate in aftercare plan * Met: No * Target date: at discharge * As evidenced by: Patient will participate within aftercare plan AEB aftercare provider and housing plan at discharge being identified.   2.  Goal (s): Patient will exhibit decreased depressive symptoms and suicidal ideations. * Met: No *  Target date: at discharge * As evidenced by: Patient will utilize self rating of depression at 3 or below and demonstrate decreased signs of depression or be deemed stable for discharge by MD.  Attendees: Patient:  Christian [name] 8/11/20161:56 PM  Family:   8/11/20161:56 PM  Physician:  Dr. Bary Leriche  8/11/20161:56 PM  Nursing:   Floyde Parkins, RN  8/11/20161:56 PM  Clinical Social Worker: [place], [place] 8/11/20161:56 PM  Counselor:   8/11/20161:56 PM  Other:  Everitt Amber, LRT  8/11/20161:56 PM  Other:   8/11/20161:56 PM  Other:   8/11/20161:56 PM  Other:  8/11/20161:56 PM  Other:  8/11/20161:56 PM  Other:  8/11/20161:56 PM  Other:  8/11/20161:56 PM  Other:  8/11/20161:56 PM  Other:  8/11/20161:56 PM  Other:   8/11/20161:56 PM   Scribe for Treatment Team:   Wray Kearns, MSW, [place]  07/10/2015, 1:56 PM

## 2015-07-10 NOTE — Progress Notes (Signed)
Initial Nutrition Assessment  INTERVENTION:   Meals and Snacks: Cater to patient preferences Medical Food Supplement Therapy: will recommend on follow if intake inadequate   NUTRITION DIAGNOSIS:   Inadequate oral intake related to acute illness as evidenced by meal completion < 50%.  GOAL:   Patient will meet greater than or equal to 90% of their needs  MONITOR:    (Energy Intake, Anthropometrics, glucose Profile)  REASON FOR ASSESSMENT:   Malnutrition Screening Tool    ASSESSMENT:   Pt admitted with suicidal ideation and h/o bipolar disorder.  Past Medical History  Diagnosis Date  . Mood swings   . Type II or unspecified type diabetes mellitus with unspecified complication, uncontrolled 06/02/2014  . Other and unspecified hyperlipidemia 06/02/2014  . OSA on CPAP 06/02/2014  . 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 Bipolar disorder   . CHF (congestive heart failure)   . CKD (chronic kidney disease), stage III   . Diabetes mellitus without complication   . Hypertension   . Gout   . Bipolar 1 disorder   . OSA on CPAP      Diet Order:  Diet Carb Modified Fluid consistency:: Thin; Room service appropriate?: Yes   Current Nutrition: Recorded po intake 15-30% of meals today.  Food/Nutrition-Related History: Per MST pt with decreased appetite PTA. Per MD note (H&P) pt reported good appetite. Recorded po intake in [facility] on 06/29/2015 at [facility] was 100% of all 3 meals.   Medications: Remeron, Lasix, Glucotrol, Novolog  Electrolyte/Renal Profile and Glucose Profile:   Recent Labs Lab 07/09/15 0146  NA 137  K 3.7  CL 98*  CO2 29  BUN 26*  CREATININE 1.67*  CALCIUM 9.0  GLUCOSE 296*   Protein Profile:  Recent Labs Lab 07/09/15 0146  ALBUMIN 3.8    Gastrointestinal Profile: WDL Last BM: 07/09/2015   Nutrition-Focused Physical Exam Findings:  Unable to complete Nutrition-Focused physical exam at this time.     Weight Change: Per CHL encounters weight loss of 60lbs in 2 months, question accuracy. RD also notes in ED 8/9 pt weight 243lbs. But on admission 8/10 222lbs.  Height:   Ht Readings from Last 1 Encounters:  07/09/15 5\' 11"  (1.803 m)    Weight:   Wt Readings from Last 1 Encounters:  07/09/15 222 lb (100.699 kg)    Wt Readings from Last 10 Encounters:  07/09/15 222 lb (100.699 kg)  07/08/15 243 lb (110.224 kg)  06/28/15 234 lb (106.142 kg)  07/16/14 258 lb (117.028 kg)  06/19/14 270 lb 15.1 oz (122.9 kg)  05/25/14 282 lb (127.914 kg)     Ideal Body Weight:   78kg  BMI:  Body mass index is 30.98 kg/(m^2).  Estimated Nutritional Needs:   Kcal:  1950-2340kcals, (25-30kcals/kg) using IBW of 78kg  Protein:  62-78g protein (0.8-1.0g/kg) using IBW og 78kg  Fluid:  1950-2325mL of fluid (25-78mL/kG) using IBW of 78kg  EDUCATION NEEDS:   Education needs no appropriate at this time  MODERATE Care Level  Leda Quail, RD, LDN Pager [phone]

## 2015-07-10 NOTE — Progress Notes (Signed)
Calm and cooperative. Slow in responding to questions when asked, able to follow simple commands. Pt remains on 1:1 for safety. Requires 2 person assist for ADLs. Unsteady gait noted. amb via w/c. Blood sugar 272. tol PO morning medications given as ordered. No inappropriate behavior noted. Will continue to monitor for safety and behavior.

## 2015-07-10 NOTE — Progress Notes (Signed)
This is a 64 y.o. Married AA male, who presents under IVC for c/o having escalating aggression toward his wife and toward the ED staff; has a h/o bipolar d/o; also has a h/o multiple hospitalizations; with with recently having been discharged from [facility] on 07/08/2015.  Per wife, "he was not ready to be released; when, he got home; he was agitated; pacing; refusing medications; so I brought him here." While speaking with client; he repeatedly stated; "they are going to lock me behind closed doors and never let me out; they are going to hurt me and beat me; with noted glare and stare; and stating, "I'm dirty; I'm dirty; I messed myself up; I don't want no medicine; i don't trust you'all." Staff provided explanation; with close 1:1 monitoring/assistance; and client accepted medications and the application of the CPAP apparatus. Pt. Belongings were searched and no contraband was found.

## 2015-07-11 ENCOUNTER — Inpatient Hospital Stay: Payer: Commercial Managed Care - HMO

## 2015-07-11 ENCOUNTER — Inpatient Hospital Stay
Admission: AD | Admit: 2015-07-11 | Discharge: 2015-07-16 | DRG: 885 | Disposition: A | Discharge status: Other Acute Inpatient Hospital | Payer: Commercial Managed Care - HMO | Source: Ambulatory Visit | Attending: Internal Medicine | Admitting: Internal Medicine

## 2015-07-11 DIAGNOSIS — Z79899 Other long term (current) drug therapy: Secondary | ICD-10-CM | POA: Diagnosis not present

## 2015-07-11 DIAGNOSIS — I509 Heart failure, unspecified: Secondary | ICD-10-CM | POA: Diagnosis present

## 2015-07-11 DIAGNOSIS — F332 Major depressive disorder, recurrent severe without psychotic features: Secondary | ICD-10-CM | POA: Diagnosis not present

## 2015-07-11 DIAGNOSIS — Z8261 Family history of arthritis: Secondary | ICD-10-CM

## 2015-07-11 DIAGNOSIS — R451 Restlessness and agitation: Secondary | ICD-10-CM | POA: Diagnosis present

## 2015-07-11 DIAGNOSIS — Z820 Family history of epilepsy and other diseases of the nervous system: Secondary | ICD-10-CM | POA: Diagnosis not present

## 2015-07-11 DIAGNOSIS — R000 Tachycardia, unspecified: Secondary | ICD-10-CM | POA: Diagnosis not present

## 2015-07-11 DIAGNOSIS — E119 Type 2 diabetes mellitus without complications: Secondary | ICD-10-CM | POA: Diagnosis present

## 2015-07-11 DIAGNOSIS — M109 Gout, unspecified: Secondary | ICD-10-CM | POA: Diagnosis present

## 2015-07-11 DIAGNOSIS — N390 Urinary tract infection, site not specified: Secondary | ICD-10-CM | POA: Diagnosis not present

## 2015-07-11 DIAGNOSIS — E860 Dehydration: Secondary | ICD-10-CM | POA: Diagnosis present

## 2015-07-11 DIAGNOSIS — E785 Hyperlipidemia, unspecified: Secondary | ICD-10-CM | POA: Diagnosis present

## 2015-07-11 DIAGNOSIS — R45851 Suicidal ideations: Secondary | ICD-10-CM | POA: Diagnosis present

## 2015-07-11 DIAGNOSIS — R441 Visual hallucinations: Secondary | ICD-10-CM | POA: Diagnosis present

## 2015-07-11 DIAGNOSIS — R627 Adult failure to thrive: Secondary | ICD-10-CM | POA: Diagnosis present

## 2015-07-11 DIAGNOSIS — I129 Hypertensive chronic kidney disease with stage 1 through stage 4 chronic kidney disease, or unspecified chronic kidney disease: Secondary | ICD-10-CM | POA: Diagnosis present

## 2015-07-11 DIAGNOSIS — F315 Bipolar disorder, current episode depressed, severe, with psychotic features: Principal | ICD-10-CM | POA: Diagnosis present

## 2015-07-11 DIAGNOSIS — G4733 Obstructive sleep apnea (adult) (pediatric): Secondary | ICD-10-CM | POA: Diagnosis present

## 2015-07-11 DIAGNOSIS — N183 Chronic kidney disease, stage 3 (moderate): Secondary | ICD-10-CM | POA: Diagnosis present

## 2015-07-11 DIAGNOSIS — I10 Essential (primary) hypertension: Secondary | ICD-10-CM | POA: Diagnosis not present

## 2015-07-11 DIAGNOSIS — N179 Acute kidney failure, unspecified: Secondary | ICD-10-CM | POA: Diagnosis present

## 2015-07-11 LAB — BASIC METABOLIC PANEL
Anion gap: 10 (ref 5–15)
BUN: 34 mg/dL — ABNORMAL HIGH (ref 6–20)
CO2: 28 mmol/L (ref 22–32)
Calcium: 8.9 mg/dL (ref 8.9–10.3)
Chloride: 101 mmol/L (ref 101–111)
Creatinine, Ser: 1.77 mg/dL — ABNORMAL HIGH (ref 0.61–1.24)
GFR calc Af Amer: 46 mL/min — ABNORMAL LOW (ref >60)
GFR calc non Af Amer: 40 mL/min — ABNORMAL LOW (ref >60)
Glucose, Bld: 196 mg/dL — ABNORMAL HIGH (ref 65–99)
Potassium: 4 mmol/L (ref 3.5–5.1)
Sodium: 139 mmol/L (ref 135–145)

## 2015-07-11 LAB — CBC
HCT: 43.2 % (ref 40.0–52.0)
Hemoglobin: 14.3 g/dL (ref 13.0–18.0)
MCH: 30.2 pg (ref 26.0–34.0)
MCHC: 33.1 g/dL (ref 32.0–36.0)
MCV: 91.4 fL (ref 80.0–100.0)
Platelets: 140 10*3/uL — ABNORMAL LOW (ref 150–440)
RBC: 4.73 MIL/uL (ref 4.40–5.90)
RDW: 12.5 % (ref 11.5–14.5)
WBC: 10.9 10*3/uL — ABNORMAL HIGH (ref 3.8–10.6)

## 2015-07-11 LAB — MRSA PCR SCREENING: MRSA by PCR: NEGATIVE

## 2015-07-11 LAB — GLUCOSE, CAPILLARY
Glucose-Capillary: 152 mg/dL — ABNORMAL HIGH (ref 65–99)
Glucose-Capillary: 156 mg/dL — ABNORMAL HIGH (ref 65–99)

## 2015-07-11 LAB — TSH: TSH: 0.521 u[IU]/mL (ref 0.350–4.500)

## 2015-07-11 MED ORDER — CHOLECALCIFEROL 10 MCG (400 UNIT) PO TABS
800.0000 [IU] | ORAL_TABLET | Freq: Every day | ORAL | Status: DC
  Administered 2015-07-11 – 2015-07-16 (×5): 800 [IU] via ORAL
  Filled 2015-07-11 (×7): qty 2

## 2015-07-11 MED ORDER — METOPROLOL SUCCINATE ER 100 MG PO TB24
200.0000 mg | ORAL_TABLET | Freq: Every day | ORAL | Status: DC
  Administered 2015-07-12 – 2015-07-16 (×5): 200 mg via ORAL
  Filled 2015-07-11 (×6): qty 2

## 2015-07-11 MED ORDER — GLIPIZIDE ER 5 MG PO TB24
5.0000 mg | ORAL_TABLET | Freq: Every day | ORAL | Status: DC
  Administered 2015-07-13 – 2015-07-16 (×3): 5 mg via ORAL
  Filled 2015-07-11 (×6): qty 1

## 2015-07-11 MED ORDER — SODIUM CHLORIDE 0.9 % IV SOLN
INTRAVENOUS | Status: DC
  Administered 2015-07-11 – 2015-07-12 (×3): via INTRAVENOUS

## 2015-07-11 MED ORDER — ATORVASTATIN CALCIUM 20 MG PO TABS
10.0000 mg | ORAL_TABLET | Freq: Every day | ORAL | Status: DC
  Administered 2015-07-11 – 2015-07-16 (×5): 10 mg via ORAL
  Filled 2015-07-11 (×7): qty 1

## 2015-07-11 MED ORDER — MIRTAZAPINE 15 MG PO TABS
30.0000 mg | ORAL_TABLET | Freq: Every day | ORAL | Status: DC
  Administered 2015-07-11 – 2015-07-14 (×4): 30 mg via ORAL
  Filled 2015-07-11 (×5): qty 2

## 2015-07-11 MED ORDER — AMLODIPINE BESYLATE 10 MG PO TABS
10.0000 mg | ORAL_TABLET | Freq: Every day | ORAL | Status: DC
  Administered 2015-07-12 – 2015-07-16 (×5): 10 mg via ORAL
  Filled 2015-07-11 (×6): qty 1

## 2015-07-11 MED ORDER — INSULIN ASPART 100 UNIT/ML ~~LOC~~ SOLN
0.0000 [IU] | Freq: Three times a day (TID) | SUBCUTANEOUS | Status: DC
  Administered 2015-07-11: 2 [IU] via SUBCUTANEOUS
  Administered 2015-07-13 (×2): 1 [IU] via SUBCUTANEOUS
  Administered 2015-07-14: 5 [IU] via SUBCUTANEOUS
  Administered 2015-07-14: 2 [IU] via SUBCUTANEOUS
  Administered 2015-07-14: 3 [IU] via SUBCUTANEOUS
  Administered 2015-07-15: 2 [IU] via SUBCUTANEOUS
  Administered 2015-07-15: 1 [IU] via SUBCUTANEOUS
  Administered 2015-07-15 – 2015-07-16 (×2): 3 [IU] via SUBCUTANEOUS
  Filled 2015-07-11: qty 1
  Filled 2015-07-11: qty 3
  Filled 2015-07-11 (×2): qty 2
  Filled 2015-07-11 (×2): qty 1
  Filled 2015-07-11: qty 5
  Filled 2015-07-11: qty 2
  Filled 2015-07-11 (×2): qty 3

## 2015-07-11 MED ORDER — HEPARIN SODIUM (PORCINE) 5000 UNIT/ML IJ SOLN
5000.0000 [IU] | Freq: Three times a day (TID) | INTRAMUSCULAR | Status: DC
  Administered 2015-07-11 – 2015-07-15 (×11): 5000 [IU] via SUBCUTANEOUS
  Filled 2015-07-11 (×12): qty 1

## 2015-07-11 MED ORDER — QUETIAPINE FUMARATE 100 MG PO TABS
400.0000 mg | ORAL_TABLET | Freq: Every day | ORAL | Status: DC
  Administered 2015-07-11 – 2015-07-14 (×4): 400 mg via ORAL
  Filled 2015-07-11 (×5): qty 4

## 2015-07-11 MED ORDER — ASPIRIN EC 81 MG PO TBEC
81.0000 mg | DELAYED_RELEASE_TABLET | Freq: Every day | ORAL | Status: DC
  Administered 2015-07-12 – 2015-07-16 (×5): 81 mg via ORAL
  Filled 2015-07-11 (×6): qty 1

## 2015-07-11 MED ORDER — ACETAMINOPHEN 325 MG PO TABS
650.0000 mg | ORAL_TABLET | Freq: Four times a day (QID) | ORAL | Status: DC | PRN
  Administered 2015-07-11 – 2015-07-12 (×2): 650 mg via ORAL
  Filled 2015-07-11 (×3): qty 2

## 2015-07-11 MED ORDER — DEXTROSE 5 % IV SOLN
1.0000 g | INTRAVENOUS | Status: DC
  Administered 2015-07-11 – 2015-07-13 (×3): 1 g via INTRAVENOUS
  Filled 2015-07-11 (×4): qty 10

## 2015-07-11 NOTE — Progress Notes (Signed)
FS @ 0830 = 164

## 2015-07-11 NOTE — Discharge Summary (Signed)
Physician Discharge Summary Note  Patient:  Christian [name] is an 64 y.o., male MRN:  [id number] DOB:  [birth year] Patient phone:  [phone] (home)  Patient address:   [address] [place] [address],  Total Time spent with patient: 30 minutes  Date of Admission:  07/09/2015 Date of Discharge: 07/11/2015  Reason for Admission:  Depression and agitation.    Principal Problem: Bipolar disorder, current episode depressed, severe, with psychotic features Discharge Diagnoses: Patient Active Problem List   Diagnosis Date Noted  . Bipolar disorder, current episode depressed, severe, with psychotic features [F31.5] 07/09/2015  . Diabetes [E11.9] 07/09/2015  . Bipolar affective, manic [F31.10] 06/21/2014  . UTI (urinary tract infection) [N39.0] 06/19/2014  . Positive blood culture [R78.81] 06/19/2014  . Bipolar affective disorder, current episode manic with psychotic symptoms [F31.2] 06/17/2014  . Type II or unspecified type diabetes mellitus with unspecified complication, uncontrolled [E11.8] 06/02/2014  . Other and unspecified hyperlipidemia [E78.5] 06/02/2014  . OSA on CPAP [G47.33] 06/02/2014  . 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 Gout [M10.9] 06/02/2014  . Hypertension [I10]     Musculoskeletal: Strength & Muscle Tone: within normal limits Gait & Station: normal Patient leans: N/A  Psychiatric Specialty Exam: Physical Exam  Nursing note and vitals reviewed.   Review of Systems  Cardiovascular: Positive for chest pain.  All other systems reviewed and are negative.   Blood pressure 126/77, pulse 120, temperature 99.4 F (37.4 C), temperature source Oral, resp. rate 22, height '5\' 11"'  (1.803 m), weight 100.699 kg (222 lb), SpO2 98 %.Body mass index is 30.98 kg/(m^2).  See SRA.                                                  Sleep:  Number of Hours: 5   Have you used any form of tobacco in the last  30 days? (Cigarettes, Smokeless Tobacco, Cigars, and/or Pipes): No  Has this patient used any form of tobacco in the last 30 days? (Cigarettes, Smokeless Tobacco, Cigars, and/or Pipes) No  Past Medical History:  Past Medical History  Diagnosis Date  . Mood swings   . Type II or unspecified type diabetes mellitus with unspecified complication, uncontrolled 06/02/2014  . Other and unspecified hyperlipidemia 06/02/2014  . OSA on CPAP 06/02/2014  . 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 Bipolar disorder   . CHF (congestive heart failure)   . CKD (chronic kidney disease), stage III   . Diabetes mellitus without complication   . Hypertension   . Gout   . Bipolar 1 disorder   . OSA on CPAP    History reviewed. No pertinent past surgical history. Family History:  Family History  Problem Relation Age of Onset  . Dementia Mother   . Arthritis Father   . Heart failure Neg Hx   . Kidney failure Neg Hx   . Cancer Neg Hx    Social History:  History  Alcohol Use No     History  Drug Use No    Social History   Social History  . Marital Status: Married    Spouse Name: N/A  . Number of Children: N/A  . Years of Education: N/A   Social History Main Topics  . Smoking status: Never Smoker   . Smokeless  tobacco: None  . Alcohol Use: No  . Drug Use: No  . Sexual Activity: Not Currently   Other Topics Concern  . None   Social History Narrative   ** Merged History Encounter **        Past Psychiatric History: Hospitalizations:  Outpatient Care:  Substance Abuse Care:  Self-Mutilation:  Suicidal Attempts:  Violent Behaviors:   Risk to Self: Is patient at risk for suicide?: Yes Risk to Others:   Prior Inpatient Therapy:   Prior Outpatient Therapy:    Level of Care:  [facility]  Hospital Course:    Christian [name] is a 64 year old male with a history of severe bipolar depression admitted for worsening of depression, psychotic features,  threatening behavior following recent hospitalization at another facility. He became increasingly despondent, most likely in the context of UTI. He has been refusing food and drink.   1. Suicidal ideation. This has resolved. The patient is able to contract for safety in the hospital.  2. Mood and psychosis. He has been maintained on a combination of Seroquel, Latuda, Lamictal and Remeron for depression, mood stabilization, psychosis. We will continue his regimen.  3. ECT treatment. The patient was treated with ECT once before. The patient and his wife discussed ECT treatment briefly with Dr. Shelbie [name] in the emergency room.  4. Diabetes. The patient is on glipizide. His sugar was elevated over 200. Will monitor in order sliding scale insulin along with diabetic diet.  5. Hypertension. We will continue for a semi-, metoprolol, and Norvasc.  6. Dyslipidemia. We will continue Lipitor.  7. Gout. We will continue allopurinol and colchicine.  8. Insomnia. We'll continue trazodone 200 mg.  9. UTI. Medicine consult is greatly appreciated.   10. Disposition. The patient will be transferred to medical floor for further treatment.   Consults:  medicine.  Significant Diagnostic Studies:  None  Discharge Vitals:   Blood pressure 126/77, pulse 120, temperature 99.4 F (37.4 C), temperature source Oral, resp. rate 22, height '5\' 11"'  (1.803 m), weight 100.699 kg (222 lb), SpO2 98 %. Body mass index is 30.98 kg/(m^2). Lab Results:   Results for orders placed or performed during the hospital encounter of 07/09/15 (from the past 72 hour(s))  CBC     Status: Abnormal   Collection Time: 07/11/15  2:40 PM  Result Value Ref Range   WBC 10.9 (H) 3.8 - 10.6 K/uL   RBC 4.73 4.40 - 5.90 MIL/uL   Hemoglobin 14.3 13.0 - 18.0 g/dL   HCT 43.2 40.0 - 52.0 %   MCV 91.4 80.0 - 100.0 fL   MCH 30.2 26.0 - 34.0 pg   MCHC 33.1 32.0 - 36.0 g/dL   RDW 12.5 11.5 - 14.5 %   Platelets 140 (L) 150 - 440 K/uL  Basic  metabolic panel     Status: Abnormal   Collection Time: 07/11/15  2:40 PM  Result Value Ref Range   Sodium 139 135 - 145 mmol/L   Potassium 4.0 3.5 - 5.1 mmol/L   Chloride 101 101 - 111 mmol/L   CO2 28 22 - 32 mmol/L   Glucose, Bld 196 (H) 65 - 99 mg/dL   BUN 34 (H) 6 - 20 mg/dL   Creatinine, Ser 1.77 (H) 0.61 - 1.24 mg/dL   Calcium 8.9 8.9 - 10.3 mg/dL   GFR calc non Af Amer 40 (L) >60 mL/min   GFR calc Af Amer 46 (L) >60 mL/min    Comment: (NOTE) The eGFR has been calculated  using the CKD EPI equation. This calculation has not been validated in all clinical situations. eGFR's persistently <60 mL/min signify possible Chronic Kidney Disease.    Anion gap 10 5 - 15    Physical Findings: AIMS: Facial and Oral Movements Muscles of Facial Expression: None, normal Lips and Perioral Area: None, normal Jaw: None, normal Tongue: None, normal,Extremity Movements Upper (arms, wrists, hands, fingers): Mild Lower (legs, knees, ankles, toes): Minimal, Trunk Movements Neck, shoulders, hips: None, normal, Overall Severity Severity of abnormal movements (highest score from questions above): Mild Incapacitation due to abnormal movements: None, normal Patient's awareness of abnormal movements (rate only patient's report): Aware, mild distress, Dental Status Current problems with teeth and/or dentures?: No Does patient usually wear dentures?: No  CIWA:    COWS:      See Psychiatric Specialty Exam and Suicide Risk Assessment completed by Attending Physician prior to discharge.  Discharge destination:  Other:  Medical floor.  Is patient on multiple antipsychotic therapies at discharge:  No   Has Patient had three or more failed trials of antipsychotic monotherapy by history:  No    Recommended Plan for Multiple Antipsychotic Therapies: NA  Discharge Instructions    Diet - low sodium heart healthy    Complete by:  As directed      Increase activity slowly    Complete by:  As directed              Medication List    STOP taking these medications        ARIPiprazole 15 MG tablet  Commonly known as:  ABILIFY     benztropine 1 MG tablet  Commonly known as:  COGENTIN     clonazePAM 0.5 MG tablet  Commonly known as:  KLONOPIN     glyBURIDE 5 MG tablet  Commonly known as:  DIABETA     LORazepam 1 MG tablet  Commonly known as:  ATIVAN      TAKE these medications      Indication   allopurinol 300 MG tablet  Commonly known as:  ZYLOPRIM  Take 1 tablet (300 mg total) by mouth daily. For gout   Indication:  Gout     amLODipine 10 MG tablet  Commonly known as:  NORVASC  Take 10 mg by mouth daily.      aspirin EC 81 MG tablet  Take 81 mg by mouth daily.      atorvastatin 10 MG tablet  Commonly known as:  LIPITOR  Take 1 tablet (10 mg total) by mouth daily. For high cholesterol   Indication:  Inherited Heterozygous Hypercholesterolemia     cholecalciferol 400 UNITS Tabs tablet  Commonly known as:  VITAMIN D  Take 800 Units by mouth daily.      colchicine 0.6 MG tablet  Take 1 tablet (0.6 mg total) by mouth 2 (two) times daily. For Gout pain   Indication:  Gout     furosemide 20 MG tablet  Commonly known as:  LASIX  Take 20 mg by mouth daily.      glipiZIDE 5 MG 24 hr tablet  Commonly known as:  GLUCOTROL XL  Take 5 mg by mouth daily.      hydrALAZINE 50 MG tablet  Commonly known as:  APRESOLINE  Take 1 tablet (50 mg total) by mouth every 6 (six) hours.   Indication:  High Blood Pressure     lamoTRIgine 100 MG tablet  Commonly known as:  LAMICTAL  Take 1 tablet (100 mg  total) by mouth every evening.   Indication:  mood stabilization     lurasidone 40 MG Tabs tablet  Commonly known as:  LATUDA  Take 40 mg by mouth every evening.      metoprolol 200 MG 24 hr tablet  Commonly known as:  TOPROL-XL  Take 1 tablet (200 mg total) by mouth daily. For high blood pressure   Indication:  High Blood Pressure     mirtazapine 30 MG tablet   Commonly known as:  REMERON  Take 30 mg by mouth at bedtime.      QUEtiapine 400 MG tablet  Commonly known as:  SEROQUEL  Take 400 mg by mouth at bedtime.      traZODone 100 MG tablet  Commonly known as:  DESYREL  Take 2 tablets (200 mg total) by mouth at bedtime. For sleep   Indication:  Trouble Sleeping         Follow-up recommendations:  Activity:  As tolerated. Diet:  Low sodium heart healthy. Other:  Keep follow-up appointments.  Comments:    Total Discharge Time: 35 min.  Signed: Orson Slick 07/11/2015, 3:25 PM

## 2015-07-11 NOTE — Progress Notes (Signed)
Patient to be transferred to medical service. Nursing report called to Almira Coaster, RN.

## 2015-07-11 NOTE — [facility] Group Notes (Signed)
[facility] Group Notes:  (Nursing/MHT/Case Management/Adjunct)  Date:  07/11/2015  Time:  1:21 PM  Type of Therapy:  Psychoeducational Skills  Participation Level:  Did Not Attend  P  Mickey Farber 07/11/2015, 1:21 PM

## 2015-07-11 NOTE — Progress Notes (Signed)
Hospitalist paged at [phone] for medical consult.

## 2015-07-11 NOTE — Progress Notes (Signed)
Patient transferred to unit 2C via wheelchair. All patient belongings - clothes and eyeglasses sent with patient.

## 2015-07-11 NOTE — Progress Notes (Signed)
Patient transferred to wheelchair with 3 person assist and taken to the dining area for lunch. Despite encouragement, patient refusing all food and fluid. Insulin held.

## 2015-07-11 NOTE — Progress Notes (Signed)
Patient cooperative with cath for urine culture but unable to obtain any urine.

## 2015-07-11 NOTE — [facility] Group Notes (Signed)
[facility] LCSW Aftercare Discharge Planning Group Note   07/11/2015 10:01 AM  Participation Quality:  Did not attend.   Hershall Benkert L Maxxon Schwanke MSW, LCSWA  

## 2015-07-11 NOTE — Progress Notes (Signed)
D:  Patient affect is blunted and his mood is depressed.  Patient did NOT attend evening group. Patient remained in his room throughout the shift. No distress noted. A: Support and encouragement offered. Scheduled medications given to pt. 1:1 observation continued for patient safety. R: Patient receptive. Patient remains safe on the unit.

## 2015-07-11 NOTE — H&P (Signed)
[facility] - [place] at [facility]   PATIENT NAME: Christian [name]    MR#:  [id number]  DATE OF BIRTH:  Mar 24, 1951  DATE OF ADMISSION:  07/11/2015  PRIMARY CARE PHYSICIAN: Leanor Rubenstein, MD   REQUESTING/REFERRING PHYSICIAN: Psych Physician  CHIEF COMPLAINT:  No chief complaint on file.   HISTORY OF PRESENT ILLNESS: Christian [name]  is a 64 y.o. male with a known history of chronic kidney disease, diabetes, hypertension, bipolar disorder- is admitted to psychiatric floor for his severe depression. He is not having good oral intake and his urine output is decreasing, his urinalysis was also +2 days ago- sol medical consult was called in for further management.  Due to severe depression it is impossible to get any history from patient is alert oriented but not having any communication.   On checking his reports it was noted that he has chronic renal failure slightly worse in this admission, positive urinalysis, and on exam he was noted to be dehydrated with tachycardia- so decision was made to take him over to medical services for further management.  PAST MEDICAL HISTORY:   Past Medical History  Diagnosis Date  . Mood swings   . Type II or unspecified type diabetes mellitus with unspecified complication, uncontrolled 06/02/2014  . Other and unspecified hyperlipidemia 06/02/2014  . OSA on CPAP 06/02/2014  . 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 Bipolar disorder   . CHF (congestive heart failure)   . CKD (chronic kidney disease), stage III   . Diabetes mellitus without complication   . Hypertension   . Gout   . Bipolar 1 disorder   . OSA on CPAP     PAST SURGICAL HISTORY: No past surgical history on file.  SOCIAL HISTORY:  Social History  Substance Use Topics  . Smoking status: Never Smoker   . Smokeless tobacco: Not on file  . Alcohol Use: No    FAMILY HISTORY:  Family History  Problem Relation Age of Onset  .  Dementia Mother   . Arthritis Father   . Heart failure Neg Hx   . Kidney failure Neg Hx   . Cancer Neg Hx     DRUG ALLERGIES: No Known Allergies  REVIEW OF SYSTEMS:   Not able to get any has patient has severe depression and he is not having any good communications.  MEDICATIONS AT HOME:  Prior to Admission medications   Medication Sig Start Date End Date Taking? Authorizing Provider  allopurinol (ZYLOPRIM) 300 MG tablet Take 1 tablet (300 mg total) by mouth daily. For gout 07/15/14   Beau Fanny, FNP  amLODipine (NORVASC) 10 MG tablet Take 10 mg by mouth daily. 06/23/15   Historical Provider, MD  aspirin EC 81 MG tablet Take 81 mg by mouth daily.    Historical Provider, MD  atorvastatin (LIPITOR) 10 MG tablet Take 1 tablet (10 mg total) by mouth daily. For high cholesterol 06/05/14   Sanjuana Kava, NP  cholecalciferol (VITAMIN D) 400 UNITS TABS tablet Take 800 Units by mouth daily.    Historical Provider, MD  colchicine 0.6 MG tablet Take 1 tablet (0.6 mg total) by mouth 2 (two) times daily. For Gout pain 06/05/14   Sanjuana Kava, NP  furosemide (LASIX) 20 MG tablet Take 20 mg by mouth daily. 06/09/15   Historical Provider, MD  glipiZIDE (GLUCOTROL XL) 5 MG 24 hr tablet Take 5 mg by mouth daily. 06/23/15   Historical Provider,  MD  hydrALAZINE (APRESOLINE) 50 MG tablet Take 1 tablet (50 mg total) by mouth every 6 (six) hours. 07/15/14   Beau Fanny, FNP  lamoTRIgine (LAMICTAL) 100 MG tablet Take 1 tablet (100 mg total) by mouth every evening. 07/15/14   Beau Fanny, FNP  lurasidone (LATUDA) 40 MG TABS tablet Take 40 mg by mouth every evening.    Historical Provider, MD  metoprolol (TOPROL-XL) 200 MG 24 hr tablet Take 1 tablet (200 mg total) by mouth daily. For high blood pressure 06/05/14   Sanjuana Kava, NP  mirtazapine (REMERON) 30 MG tablet Take 30 mg by mouth at bedtime.    Historical Provider, MD  QUEtiapine (SEROQUEL) 400 MG tablet Take 400 mg by mouth at bedtime.  06/23/15    Historical Provider, MD  traZODone (DESYREL) 100 MG tablet Take 2 tablets (200 mg total) by mouth at bedtime. For sleep 06/05/14   Sanjuana Kava, NP      PHYSICAL EXAMINATION:   VITAL SIGNS: There were no vitals taken for this visit.  GENERAL:  64 y.o.-year-old patient lying in the bed with no acute distress.  EYES: Pupils equal, round, reactive to light and accommodation. No scleral icterus. Extraocular muscles intact. Dry mucosa. HEENT: Head atraumatic, normocephalic. Oropharynx and nasopharynx clear.  NECK:  Supple, no jugular venous distention. No thyroid enlargement, no tenderness.  LUNGS: Normal breath sounds bilaterally, no wheezing, rales,rhonchi or crepitation. No use of accessory muscles of respiration.  CARDIOVASCULAR: S1, S2 normal. No murmurs, rubs, or gallops.  ABDOMEN: Soft, nontender, nondistended. Bowel sounds present. No organomegaly or mass.  EXTREMITIES: No pedal edema, cyanosis, or clubbing.  NEUROLOGIC: Cranial nerves II through XII are intact. Muscle strength 5/5 in all extremities. Sensation intact. Gait not checked.  PSYCHIATRIC: The patient is alert and oriented x 3. Flat affect and depressed mood. SKIN: No obvious rash, lesion, or ulcer.   LABORATORY PANEL:   CBC  Recent Labs Lab 07/09/15 0146 07/11/15 1440  WBC 6.7 10.9*  HGB 14.2 14.3  HCT 42.7 43.2  PLT 166 140*  MCV 90.3 91.4  MCH 30.0 30.2  MCHC 33.2 33.1  RDW 12.4 12.5  LYMPHSABS 1.3  --   MONOABS 0.7  --   EOSABS 0.0  --   BASOSABS 0.0  --    ------------------------------------------------------------------------------------------------------------------  Chemistries   Recent Labs Lab 07/09/15 0146 07/11/15 1440  NA 137 139  K 3.7 4.0  CL 98* 101  CO2 29 28  GLUCOSE 296* 196*  BUN 26* 34*  CREATININE 1.67* 1.77*  CALCIUM 9.0 8.9  AST 40  --   ALT 25  --   ALKPHOS 94  --   BILITOT 0.6  --     ------------------------------------------------------------------------------------------------------------------ estimated creatinine clearance is 53 mL/min (by C-G formula based on Cr of 1.77). ------------------------------------------------------------------------------------------------------------------ No results for input(s): TSH, T4TOTAL, T3FREE, THYROIDAB in the last 72 hours.  Invalid input(s): FREET3   Urinalysis    Component Value Date/Time   COLORURINE YELLOW* 07/09/2015 2029   COLORURINE Straw 08/01/2014 1614   APPEARANCEUR HAZY* 07/09/2015 2029   APPEARANCEUR Cloudy 08/01/2014 1614   LABSPEC 1.016 07/09/2015 2029   LABSPEC 1.008 08/01/2014 1614   PHURINE 5.0 07/09/2015 2029   PHURINE 6.0 08/01/2014 1614   GLUCOSEU >500* 07/09/2015 2029   GLUCOSEU Negative 08/01/2014 1614   HGBUR 2+* 07/09/2015 2029   HGBUR Negative 08/01/2014 1614   BILIRUBINUR NEGATIVE 07/09/2015 2029   BILIRUBINUR Negative 08/01/2014 1614   KETONESUR NEGATIVE 07/09/2015 2029  KETONESUR Negative 08/01/2014 1614   PROTEINUR 100* 07/09/2015 2029   PROTEINUR Negative 08/01/2014 1614   UROBILINOGEN 0.2 07/01/2014 1809   NITRITE NEGATIVE 07/09/2015 2029   NITRITE Negative 08/01/2014 1614   LEUKOCYTESUR TRACE* 07/09/2015 2029   LEUKOCYTESUR Negative 08/01/2014 1614     RADIOLOGY: No results found.  EKG: Sinus tachycardia.  IMPRESSION AND PLAN:  * UTI   this patient has signs of dehydration with that, and poor oral intake   I will give IV Rocephin, and get urine culture to guide further therapy.  * Acute on chronic renal failure   Baseline creatinine 1.25 in September 2015    and gradual rising in his creatinine level in last 10 days, currently 1.77   Likely it is due to dehydration as he has decreased oral intake   IV hydration, avoid nephrotoxic medication, follow renal function.  * Diabetes  Hold any baseline medication because of decreased oral intake, insulin sliding scale  coverage for hyperglycemia.  *  hypertension   continue metoprolol, hold diuretics as he was taking- because of renal failure.  * hyperlipidemia  cont statin.  All the records are reviewed and case discussed with ED provider. Management plans discussed with the patient, family and they are in agreement.  CODE STATUS:    Code Status Orders        Start     Ordered   07/11/15 1704  Full code   Continuous     07/11/15 1703       TOTAL TIME TAKING CARE OF THIS PATIENT: 60 minutes.    Altamese Dilling M.D on 07/11/2015   Between 7am to 6pm - Pager - [phone]  After 6pm go to [email or web address] - password [email or web address] [facility]  [place] [facility]  Office  [phone]  CC: Primary care physician; Leanor Rubenstein, MD

## 2015-07-11 NOTE — Progress Notes (Signed)
Patient is drowsy but responsive to simple commands. He is aware he is in the hospital. Mood is depressed with congruent affect. Unable to determine mental status. Speech is soft, slurred with poverty of content.  Patient is refusing to eat but will drink if fluids are held to his lips. Took all medications without incident. Currently no specific complaints but patient does appear distressed.  Patient is total care with all ADLs. Braden scale is low - skin care precautions initiated. Patient to be turned and positioned q 2 hours.  Will continue current treatment plan as ordered. Monitor mood, mental status. Monitor I & O. Patient is 1:1 for safety.

## 2015-07-11 NOTE — BHH Suicide Risk Assessment (Signed)
[facility] Discharge Suicide Risk Assessment   Demographic Factors:  Male  Total Time spent with patient: 30 minutes  Musculoskeletal: Strength & Muscle Tone: within normal limits Gait & Station: normal Patient leans: N/A  Psychiatric Specialty Exam: Physical Exam  Nursing note and vitals reviewed.   Review of Systems  All other systems reviewed and are negative.   Blood pressure 126/77, pulse 120, temperature 99.4 F (37.4 C), temperature source Oral, resp. rate 22, height 5\' 11"  (1.803 m), weight 100.699 kg (222 lb), SpO2 98 %.Body mass index is 30.98 kg/(m^2).  General Appearance: Disheveled  Eye Contact::  Minimal  Speech:  Slow409  Volume:  Decreased  Mood:  Depressed and Hopeless  Affect:  Depressed  Thought Process:  Linear  Orientation:  Full (Time, Place, and Person)  Thought Content:  Paranoid Ideation  Suicidal Thoughts:  No  Homicidal Thoughts:  No  Memory:  Immediate;   Poor Recent;   Poor Remote;   Poor  Judgement:  Poor  Insight:  Shallow  Psychomotor Activity:  Decreased  Concentration:  Poor  Recall:  Poor  Fund of Knowledge:Poor  Language: Poor  Akathisia:  No  Handed:  Right  AIMS (if indicated):     Assets:  Financial Resources/Insurance Housing Intimacy Social Support  Sleep:  Number of Hours: 5  Cognition: WNL  ADL's:  Intact   Have you used any form of tobacco in the last 30 days? (Cigarettes, Smokeless Tobacco, Cigars, and/or Pipes): No  Has this patient used any form of tobacco in the last 30 days? (Cigarettes, Smokeless Tobacco, Cigars, and/or Pipes) No  Mental Status Per Nursing Assessment::   On Admission:     Current Mental Status by Physician: NA  Loss Factors: NA  Historical Factors: Prior suicide attempts  Risk Reduction Factors:   Sense of responsibility to family and Living with another person, especially a relative  Continued Clinical Symptoms:  Bipolar Disorder:   Depressive phase  Cognitive Features That  Contribute To Risk:  None    Suicide Risk:  Minimal: No identifiable suicidal ideation.  Patients presenting with no risk factors but with morbid ruminations; may be classified as minimal risk based on the severity of the depressive symptoms  Principal Problem: Bipolar disorder, current episode depressed, severe, with psychotic features Discharge Diagnoses:  Patient Active Problem List   Diagnosis Date Noted  . Bipolar disorder, current episode depressed, severe, with psychotic features [F31.5] 07/09/2015  . Diabetes [E11.9] 07/09/2015  . Bipolar affective, manic [F31.10] 06/21/2014  . UTI (urinary tract infection) [N39.0] 06/19/2014  . Positive blood culture [R78.81] 06/19/2014  . Bipolar affective disorder, current episode manic with psychotic symptoms [F31.2] 06/17/2014  . Type II or unspecified type diabetes mellitus with unspecified complication, uncontrolled [E11.8] 06/02/2014  . Other and unspecified hyperlipidemia [E78.5] 06/02/2014  . OSA on CPAP [G47.33] 06/02/2014  . 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 Gout [M10.9] 06/02/2014  . Hypertension [I10]       Plan Of Care/Follow-up recommendations:  Activity:  As tolerated. Diet:  Low sodium heart healthy. Other:  P follow-up appointments.  Is patient on multiple antipsychotic therapies at discharge:  No   Has Patient had three or more failed trials of antipsychotic monotherapy by history:  No  Recommended Plan for Multiple Antipsychotic Therapies: NA    Felicia Both 07/11/2015, 3:21 PM

## 2015-07-11 NOTE — Consult Note (Signed)
I was asked to see this patient because he has urinalysis is positive and his not eating or drinking much, and he has low urine output.  The initial consult was called in yesterday at 2:40 PM stat consult, but I did not receive any phone call from either physician or the floor clerk.  I went in seeing the patient today, because of severe depression he is not talking much so is hard to get any history from him.  Physical exam  Patient appears depressed,  He has dry mucosa,   Tachycardia with irregular heart rhythm, no appreciable murmurs.  Abdomen soft nontender, bowel sound present.  He is moving all 4 limbs, power 5 out of 5 in all extremities, follows, and.  He is alert and oriented to time place and person but in depressed mood.  Laboratory reports  I reviewed his lab results from 2 days ago,  He has positive urinalysis, he has mild worsening of his renal function compared to past.  Sinus tachycardia on EKG.  WBC count and hemoglobin are normal.  Assessment and plan  * UTI    I will send her urine culture for now, I need to have repeat labs today as her last labs was more than 36 hours ago, as per the nurse in psychiatric floor he has very less oral intake, and only 100 mL urine output since morning. He appears to be dehydrated- with tachycardia.   I suggest to transfer him to medical floor, under medical service- as he will need IV fluids, and likely IV antibiotics.  And I will let psychiatric team manage his depression on medical floor.   Total time spent in seeing this patient on psychiatric floor and plan in the management of his 40 minute.

## 2015-07-11 NOTE — Progress Notes (Signed)
FS @ 1130 = 169

## 2015-07-12 LAB — CBC
HCT: 41.3 % (ref 40.0–52.0)
Hemoglobin: 13.7 g/dL (ref 13.0–18.0)
MCH: 30.1 pg (ref 26.0–34.0)
MCHC: 33.1 g/dL (ref 32.0–36.0)
MCV: 90.9 fL (ref 80.0–100.0)
Platelets: 139 10*3/uL — ABNORMAL LOW (ref 150–440)
RBC: 4.55 MIL/uL (ref 4.40–5.90)
RDW: 12.5 % (ref 11.5–14.5)
WBC: 6.7 10*3/uL (ref 3.8–10.6)

## 2015-07-12 LAB — BASIC METABOLIC PANEL
Anion gap: 10 (ref 5–15)
BUN: 26 mg/dL — ABNORMAL HIGH (ref 6–20)
CO2: 29 mmol/L (ref 22–32)
Calcium: 8.7 mg/dL — ABNORMAL LOW (ref 8.9–10.3)
Chloride: 103 mmol/L (ref 101–111)
Creatinine, Ser: 1.46 mg/dL — ABNORMAL HIGH (ref 0.61–1.24)
GFR calc Af Amer: 58 mL/min — ABNORMAL LOW (ref >60)
GFR calc non Af Amer: 50 mL/min — ABNORMAL LOW (ref >60)
Glucose, Bld: 138 mg/dL — ABNORMAL HIGH (ref 65–99)
Potassium: 3.8 mmol/L (ref 3.5–5.1)
Sodium: 142 mmol/L (ref 135–145)

## 2015-07-12 LAB — GLUCOSE, CAPILLARY
Glucose-Capillary: 133 mg/dL — ABNORMAL HIGH (ref 65–99)
Glucose-Capillary: 172 mg/dL — ABNORMAL HIGH (ref 65–99)
Glucose-Capillary: 157 mg/dL — ABNORMAL HIGH (ref 65–99)
Glucose-Capillary: 145 mg/dL — ABNORMAL HIGH (ref 65–99)

## 2015-07-12 LAB — HEMOGLOBIN A1C: Hgb A1c MFr Bld: 7.5 % — ABNORMAL HIGH (ref 4.0–6.0)

## 2015-07-12 MED ORDER — OLANZAPINE 10 MG PO TABS
5.0000 mg | ORAL_TABLET | Freq: Every day | ORAL | Status: DC
  Administered 2015-07-12 – 2015-07-14 (×3): 5 mg via ORAL
  Filled 2015-07-12 (×4): qty 1

## 2015-07-12 MED ORDER — BISACODYL 10 MG RE SUPP: 10.0000 mg | Freq: Every day | RECTAL | Status: DC | PRN

## 2015-07-12 MED ORDER — SODIUM CHLORIDE 0.9 % IV SOLN
INTRAVENOUS | Status: DC
  Administered 2015-07-12 – 2015-07-13 (×4): via INTRAVENOUS

## 2015-07-12 MED ORDER — CITALOPRAM HYDROBROMIDE 20 MG PO TABS
10.0000 mg | ORAL_TABLET | Freq: Every day | ORAL | Status: DC
  Administered 2015-07-12 – 2015-07-16 (×5): 10 mg via ORAL
  Filled 2015-07-12 (×6): qty 1

## 2015-07-12 MED ORDER — SODIUM CHLORIDE 0.9 % IV BOLUS (SEPSIS)
500.0000 mL | Freq: Once | INTRAVENOUS | Status: AC
  Administered 2015-07-12: 500 mL via INTRAVENOUS

## 2015-07-12 MED ORDER — ENSURE ENLIVE PO LIQD: 237.0000 mL | Freq: Three times a day (TID) | ORAL | Status: DC

## 2015-07-12 MED ORDER — METOPROLOL TARTRATE 1 MG/ML IV SOLN: 5.0000 mg | Freq: Four times a day (QID) | INTRAVENOUS | Status: DC

## 2015-07-12 MED ORDER — GLUCERNA SHAKE PO LIQD
237.0000 mL | Freq: Three times a day (TID) | ORAL | Status: DC
  Administered 2015-07-14 – 2015-07-16 (×5): 237 mL via ORAL

## 2015-07-12 NOTE — Progress Notes (Signed)
[facility] - [place] at [facility]   PATIENT NAME: Christian [name]    MR#:  [id number]  DATE OF BIRTH:  [birth year]  SUBJECTIVE:  CHIEF COMPLAINT:  Patient is with altered mental status, one-on-one observation States it's too late for all questions you ask no family members at bedside,  REVIEW OF SYSTEMS:  Unobtainable as the patient is with altered mental status DRUG ALLERGIES:  No Known Allergies  VITALS:  Blood pressure 149/85, pulse 121, temperature 99.3 F (37.4 C), temperature source Oral, height 6' (1.829 m), weight 102.286 kg (225 lb 8 oz), SpO2 98 %.  PHYSICAL EXAMINATION:  GENERAL:  64 y.o.-year-old patient lying in the bed with no acute distress.  EYES: Pupils equal, round, reactive to light and accommodation. No scleral icterus. Marland Kitchen  HEENT: Head atraumatic, normocephalic. NECK:  Supple, no jugular venous distention. No thyroid enlargement, no tenderness.  LUNGS: Normal breath sounds bilaterally, no wheezing, rales,rhonchi or crepitation. No use of accessory muscles of respiration.  CARDIOVASCULAR: S1, S2 normal. No murmurs, rubs, or gallops.  ABDOMEN: Soft, nontender, nondistended. Bowel sounds present. No organomegaly or mass.  EXTREMITIES: No pedal edema, cyanosis, or clubbing.  NEUROLOGIC: Could not assess neuro examination  PSYCHIATRIC: The patient is alert and disoriented.  SKIN: No obvious rash, lesion, or ulcer.    LABORATORY PANEL:   CBC  Recent Labs Lab 07/12/15 0650  WBC 6.7  HGB 13.7  HCT 41.3  PLT 139*   ------------------------------------------------------------------------------------------------------------------  Chemistries   Recent Labs Lab 07/09/15 0146  07/12/15 0650  NA 137  < > 142  K 3.7  < > 3.8  CL 98*  < > 103  CO2 29  < > 29  GLUCOSE 296*  < > 138*  BUN 26*  < > 26*  CREATININE 1.67*  < > 1.46*  CALCIUM 9.0  < > 8.7*  AST 40  --   --   ALT 25  --   --   ALKPHOS 94  --   --   BILITOT 0.6  --    --   < > = values in this interval not displayed. ------------------------------------------------------------------------------------------------------------------  Cardiac Enzymes No results for input(s): TROPONINI in the last 168 hours. ------------------------------------------------------------------------------------------------------------------  RADIOLOGY:  No results found.  EKG:   Orders placed or performed during the hospital encounter of 07/09/15  . EKG 12-Lead  . EKG 12-Lead    ASSESSMENT AND PLAN:  IMPRESSION AND PLAN:  * UTI  this patient has signs of dehydration  and poor oral intake  I will continue IV Rocephin, and get urine culture to guide further therapy.  * Acute on chronic renal failure secondary to dehydration and poor by mouth intake  Baseline creatinine 1.25 in September 2015 currently 1.77 Provide IV hydration, avoid nephrotoxic medication, follow renal function.  * Diabetes Hold any baseline medication because of decreased oral intake, insulin sliding scale coverage for hyperglycemia.  * hypertension  continue metoprolol, hold diuretics as he was taking- because of renal failure.  * hyperlipidemia cont statin.  *Acute depression with poor by mouth intake and failure to thrive Appreciate psychiatry recommendations, start the patient on Celexa and Zyprexa as per their recommendations Continue Seroquel     All the records are reviewed and case discussed with Care Management/Social Workerr. Management plans discussed with the patient, family and they are in agreement.  CODE STATUS: Full code  TOTAL TIME TAKING CARE OF THIS PATIENT: 35 minutes.   POSSIBLE D/C IN ? DAYS,  DEPENDING ON CLINICAL CONDITION.   Ramonita Lab M.D on 07/12/2015 at 3:38 PM  Between 7am to 6pm - Pager - [phone] After 6pm go to [email or web address] - password [email or web address] [facility]  [place] [facility]  Office  [phone]  CC: Primary care physician;  Leanor Rubenstein, MD

## 2015-07-12 NOTE — Consult Note (Signed)
  Pt seen today in [facility] [address] Pt is a 65 yr old A male who is a poor historian. Staff reports that pt is very depressed and is withdrawn and has not been eating or drinking and has multiple physical problems.  Pt reports that he is on disability and staff reports that he has D/Mellitus and Renal failure. Pt reports that he has cancer and does not elaborate on the same he is with a sitter who stated that he has not been eating or drinking except for some juice.  Pt reports tht he is married but when he was asked if he lived with his wife he said" Not anymore," Pt calls himself as homeless. Past Psych history  No previous H/O Inpt to psychiatry. No H/O suicide attempts. M.S. Alert but no spontainity of speech. No agitation. Affect is flat and mood depressed. Feels low and down and depressed and does not appear to be responding to internal stimuli.  Stated " It is too late for everything that was asked." Behavior is unpredictable and is dangerous to himself and is suicidal with wishes. I/J poor. MDD with SI. REc Start pt on low dose of Celexa to help depression and Zyprexa to act as adjunct with the same and to improve his appetite./Contiue one on one observation for safety. Will re -eval pt tomorrow ie 07/13/2015

## 2015-07-12 NOTE — Progress Notes (Signed)
Initial Nutrition Assessment  INTERVENTION:   Medical Food Supplement Therapy: recommend Ensure Enlive po TID, each supplement provides 350 kcal and 20 grams of protein, as pt is taking some liquids   NUTRITION DIAGNOSIS:   Inadequate oral intake related to acute illness, poor appetite as evidenced by meal completion < 25%.  GOAL:   Patient will meet greater than or equal to 90% of their needs   MONITOR:    (Energy Intake, Cognition, Anthropometrics, Digestive System, Electrolyte/Renal Profile)  REASON FOR ASSESSMENT:   Malnutrition Screening Tool    ASSESSMENT:    Pt admitted with UTI, dehydration, acute on CRF; pt continues to repeat "too late" on visit today; "too late" to eat, "too late" to sit up; would not answer any other questions. Noted psych consult pending  Past Medical History  Diagnosis Date  . Mood swings   . Type II or unspecified type diabetes mellitus with unspecified complication, uncontrolled 06/02/2014  . Other and unspecified hyperlipidemia 06/02/2014  . OSA on CPAP 06/02/2014  . 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 Bipolar disorder   . CHF (congestive heart failure)   . CKD (chronic kidney disease), stage III   . Diabetes mellitus without complication   . Hypertension   . Gout   . Bipolar 1 disorder   . OSA on CPAP      Diet Order:  Diet Carb Modified Fluid consistency:: Thin; Room service appropriate?: Yes   Energy Intake: pt ate 0% at dinner last night, 0% at breakfast this AM, taking sips of water, juice  Food and Nutrition related history: unable to assess  Electrolyte and Renal Profile:  Recent Labs Lab 07/09/15 0146 07/11/15 1440 07/12/15 0650  BUN 26* 34* 26*  CREATININE 1.67* 1.77* 1.46*  NA 137 139 142  K 3.7 4.0 3.8   Glucose Profile:   Recent Labs  07/11/15 1804 07/11/15 2120 07/12/15 0737  GLUCAP 152* 156* 133*   Protein Profile:   Recent Labs Lab 07/09/15 0146   ALBUMIN 3.8   Meds: NS at 100 ml/hr, glucotrol, ss novolog, remeron  Skin:  Reviewed, no issues   Nutrition Focused physical Exam: Nutrition-Focused physical exam completed. Findings are WDL for fat depletion, muscle depletion, and edema.   Height:   Ht Readings from Last 1 Encounters:  07/11/15 6' (1.829 m)    Weight: Based on weight trend from previous encounters, weight is definitley trending down; 13.9% in 1 year. Pt is 124.7% IBW  Wt Readings from Last 1 Encounters:  07/09/15 222 lb (100.699 kg)   Wt Readings from Last 10 Encounters:  07/09/15  222 lb (100.699 kg)  07/08/15  243 lb (110.224 kg)  06/28/15  234 lb (106.142 kg)  07/16/14  258 lb (117.028 kg)  06/19/14  270 lb 15.1 oz (122.9 kg)  05/25/14  282 lb (127.914 kg)  BMI:  There is no weight on file to calculate BMI.  Estimated Nutritional Needs:   Kcal:  5397-6734 kcals (BEE 1838, 1.3 AF, 1.0-1.2 IF)  Protein:  100-120 g (1.0-1.2 g/kg)   Fluid:  2500-3000 mL (25-30 ml/kg)   MODERATE Care Level  Romelle Starcher MS, RD, LDN [phone] Pager

## 2015-07-12 NOTE — Progress Notes (Signed)
Gave report to 2A nurse, Gearldine Bienenstock.

## 2015-07-13 LAB — BASIC METABOLIC PANEL
Anion gap: 10 (ref 5–15)
BUN: 16 mg/dL (ref 6–20)
CO2: 24 mmol/L (ref 22–32)
Calcium: 8.4 mg/dL — ABNORMAL LOW (ref 8.9–10.3)
Chloride: 104 mmol/L (ref 101–111)
Creatinine, Ser: 1.15 mg/dL (ref 0.61–1.24)
GFR calc Af Amer: >60 mL/min (ref >60)
GFR calc non Af Amer: >60 mL/min (ref >60)
Glucose, Bld: 122 mg/dL — ABNORMAL HIGH (ref 65–99)
Potassium: 3.7 mmol/L (ref 3.5–5.1)
Sodium: 138 mmol/L (ref 135–145)

## 2015-07-13 LAB — CBC
HCT: 40 % (ref 40.0–52.0)
Hemoglobin: 13.4 g/dL (ref 13.0–18.0)
MCH: 30 pg (ref 26.0–34.0)
MCHC: 33.5 g/dL (ref 32.0–36.0)
MCV: 89.8 fL (ref 80.0–100.0)
Platelets: 150 10*3/uL (ref 150–440)
RBC: 4.45 MIL/uL (ref 4.40–5.90)
RDW: 12.4 % (ref 11.5–14.5)
WBC: 2.8 10*3/uL — ABNORMAL LOW (ref 3.8–10.6)

## 2015-07-13 LAB — GLUCOSE, CAPILLARY
Glucose-Capillary: 126 mg/dL — ABNORMAL HIGH (ref 65–99)
Glucose-Capillary: 129 mg/dL — ABNORMAL HIGH (ref 65–99)
Glucose-Capillary: 122 mg/dL — ABNORMAL HIGH (ref 65–99)
Glucose-Capillary: 195 mg/dL — ABNORMAL HIGH (ref 65–99)

## 2015-07-13 MED ORDER — METOPROLOL TARTRATE 1 MG/ML IV SOLN
5.0000 mg | Freq: Four times a day (QID) | INTRAVENOUS | Status: DC | PRN
  Filled 2015-07-13 (×2): qty 5

## 2015-07-13 NOTE — Progress Notes (Signed)
[facility] - [place] at [facility]   PATIENT NAME: Christian [name]    MR#:  [id number]  DATE OF BIRTH:  September 03, 1951  SUBJECTIVE:  CHIEF COMPLAINT:  Patient is with one-on-one observation, opens his eyes to verbal commands but doesn't answer any questions. Poor by mouth intake as reported by the sitter. No family members at bedside   REVIEW OF SYSTEMS:  Unobtainable as the patient is with altered mental status DRUG ALLERGIES:  No Known Allergies  VITALS:  Blood pressure 154/80, pulse 101, temperature 97.7 F (36.5 C), temperature source Axillary, resp. rate 20, height 6' (1.829 m), weight 102.286 kg (225 lb 8 oz), SpO2 99 %.  PHYSICAL EXAMINATION:  GENERAL:  64 y.o.-year-old patient lying in the bed with no acute distress.  EYES: Pupils equal, round. No scleral icterus. Marland Kitchen  HEENT: Head atraumatic, normocephalic. NECK:  Supple, no jugular venous distention. No thyroid enlargement, no tenderness.  LUNGS: Normal breath sounds bilaterally, no wheezing, rales,rhonchi or crepitation. No use of accessory muscles of respiration.  CARDIOVASCULAR: S1, S2 normal. No murmurs, rubs, or gallops.  ABDOMEN: Soft, nontender, nondistended. Bowel sounds present. No organomegaly or mass.  EXTREMITIES: No pedal edema, cyanosis, or clubbing.  NEUROLOGIC: Could not assess neuro examination  PSYCHIATRIC: The patient is alert and disoriented.  SKIN: No obvious rash, lesion, or ulcer.    LABORATORY PANEL:   CBC  Recent Labs Lab 07/13/15 0638  WBC 2.8*  HGB 13.4  HCT 40.0  PLT 150   ------------------------------------------------------------------------------------------------------------------  Chemistries   Recent Labs Lab 07/09/15 0146  07/13/15 0638  NA 137  < > 138  K 3.7  < > 3.7  CL 98*  < > 104  CO2 29  < > 24  GLUCOSE 296*  < > 122*  BUN 26*  < > 16  CREATININE 1.67*  < > 1.15  CALCIUM 9.0  < > 8.4*  AST 40  --   --   ALT 25  --   --   ALKPHOS 94   --   --   BILITOT 0.6  --   --   < > = values in this interval not displayed. ------------------------------------------------------------------------------------------------------------------  Cardiac Enzymes No results for input(s): TROPONINI in the last 168 hours. ------------------------------------------------------------------------------------------------------------------  RADIOLOGY:  No results found.  EKG:   Orders placed or performed during the hospital encounter of 07/09/15  . EKG 12-Lead  . EKG 12-Lead    ASSESSMENT AND PLAN:  IMPRESSION AND PLAN:  * Acute cystitis this patient has signs of dehydration  and poor oral intake  I will continue IV Rocephin, and get another urine culture to guide further therapy. Initially urine culture was sent to lab on August 12 but the specimen was not found by the lab  *Acute depression with poor by mouth intake and failure to thrive Appreciate psychiatry recommendations, started the patient on Celexa and Zyprexa as per their recommendations Continue Seroquel    * Acute on chronic renal failure secondary to dehydration and poor by mouth intake  Baseline creatinine 1.25 in September 2015 currently 1.15 Provide IV hydration, avoid nephrotoxic medication, follow renal function.  * Diabetes Hold any baseline medication because of decreased oral intake, insulin sliding scale coverage for hyperglycemia.  * hypertension  continue metoprolol, hold diuretics as he was taking- because of renal failure. Will provide him IV Lopressor as needed basis as patient is refusing by mouth medications  * hyperlipidemia cont statin.  *Acute depression with poor by  mouth intake and failure to thrive follow-up with psychiatry  started the patient on Celexa and Zyprexa as per their recommendations Continue Seroquel  *Failure to thrive with poor by mouth intake We will start him on IV fluids Consult dietitian regarding calorie  count     All the records are reviewed and case discussed with Care Management/Social Workerr. Management plans discussed with the patient, family and they are in agreement.  CODE STATUS: Full code  TOTAL TIME TAKING CARE OF THIS PATIENT: 35 minutes.   POSSIBLE D/C IN ? DAYS, DEPENDING ON CLINICAL CONDITION.   Ramonita Lab M.D on 07/13/2015 at 2:41 PM  Between 7am to 6pm - Pager - [phone] After 6pm go to [email or web address] - password [email or web address] [facility]  [place] [facility]  Office  [phone]  CC: Primary care physician; Leanor Rubenstein, MD

## 2015-07-13 NOTE — Consult Note (Signed)
  Pt seen in Room No 224 [facility]. Pt is a 64 yr old AA male who is a poor historian.  Still with the sitter. Staff reports that family was contacted and they stated that pt has been feeling increasingly depressed with multiple suicide attempts in the past.  M.s. Pt is seen laying in bed . Calm and quiet.  No agitation. Does not  talk or verbalize. Affect is flat and mood is restricted. Still very depressed and feels low and down. Does not appear to be responding to internal stimuli.  Still depressed with suicidal wishes and does not contract for  Safety. I/J poor. Imp MDD severe with SI . REc Continue one on one observation and sitter and psychotropic meds which will take time to start acting. Encourage pt to eat and drink frequently. RE Consult Psychiatry after he is medically clear and treated for UTI for Transfer to [facility] for treatment for depression.

## 2015-07-13 NOTE — Progress Notes (Signed)
Notified Dr. Clint Guy - pt refusing to let RN or supervisor place IV - patient is drinking water - encouraging oral intake - MD acknowledged.

## 2015-07-14 LAB — BASIC METABOLIC PANEL
Anion gap: 15 (ref 5–15)
BUN: 25 mg/dL — ABNORMAL HIGH (ref 6–20)
CO2: 24 mmol/L (ref 22–32)
Calcium: 8.8 mg/dL — ABNORMAL LOW (ref 8.9–10.3)
Chloride: 97 mmol/L — ABNORMAL LOW (ref 101–111)
Creatinine, Ser: 1.74 mg/dL — ABNORMAL HIGH (ref 0.61–1.24)
GFR calc Af Amer: 47 mL/min — ABNORMAL LOW (ref >60)
GFR calc non Af Amer: 41 mL/min — ABNORMAL LOW (ref >60)
Glucose, Bld: 232 mg/dL — ABNORMAL HIGH (ref 65–99)
Potassium: 3.8 mmol/L (ref 3.5–5.1)
Sodium: 136 mmol/L (ref 135–145)

## 2015-07-14 LAB — CBC
HCT: 45.4 % (ref 40.0–52.0)
Hemoglobin: 15.1 g/dL (ref 13.0–18.0)
MCH: 30.4 pg (ref 26.0–34.0)
MCHC: 33.3 g/dL (ref 32.0–36.0)
MCV: 91.3 fL (ref 80.0–100.0)
Platelets: 206 10*3/uL (ref 150–440)
RBC: 4.97 MIL/uL (ref 4.40–5.90)
RDW: 12.4 % (ref 11.5–14.5)
WBC: 5.2 10*3/uL (ref 3.8–10.6)

## 2015-07-14 LAB — GLUCOSE, CAPILLARY
Glucose-Capillary: 223 mg/dL — ABNORMAL HIGH (ref 65–99)
Glucose-Capillary: 289 mg/dL — ABNORMAL HIGH (ref 65–99)
Glucose-Capillary: 185 mg/dL — ABNORMAL HIGH (ref 65–99)
Glucose-Capillary: 220 mg/dL — ABNORMAL HIGH (ref 65–99)

## 2015-07-14 MED ORDER — ACETAMINOPHEN 325 MG PO TABS: 650.0000 mg | ORAL_TABLET | Freq: Four times a day (QID) | ORAL | Status: DC | PRN

## 2015-07-14 MED ORDER — HALOPERIDOL LACTATE 5 MG/ML IJ SOLN
5.0000 mg | Freq: Once | INTRAMUSCULAR | Status: AC
  Administered 2015-07-15: 5 mg via INTRAMUSCULAR
  Filled 2015-07-14: qty 1

## 2015-07-14 MED ORDER — METOPROLOL TARTRATE 50 MG PO TABS
50.0000 mg | ORAL_TABLET | Freq: Once | ORAL | Status: AC
  Administered 2015-07-14: 50 mg via ORAL
  Filled 2015-07-14: qty 1

## 2015-07-14 MED ORDER — HALOPERIDOL LACTATE 5 MG/ML IJ SOLN
1.0000 mg | Freq: Four times a day (QID) | INTRAMUSCULAR | Status: DC | PRN
  Administered 2015-07-14 – 2015-07-15 (×3): 1 mg via INTRAMUSCULAR
  Filled 2015-07-14 (×3): qty 1

## 2015-07-14 MED ORDER — CIPROFLOXACIN HCL 500 MG PO TABS
500.0000 mg | ORAL_TABLET | Freq: Two times a day (BID) | ORAL | Status: DC
  Administered 2015-07-14 – 2015-07-16 (×2): 500 mg via ORAL
  Filled 2015-07-14 (×4): qty 1

## 2015-07-14 MED ORDER — ALUM & MAG HYDROXIDE-SIMETH 200-200-20 MG/5ML PO SUSP: 30.0000 mL | ORAL | Status: DC | PRN

## 2015-07-14 MED ORDER — POLYETHYLENE GLYCOL 3350 17 G PO PACK
17.0000 g | PACK | Freq: Every day | ORAL | Status: DC
  Administered 2015-07-14 – 2015-07-16 (×3): 17 g via ORAL
  Filled 2015-07-14 (×3): qty 1

## 2015-07-14 MED ORDER — MAGNESIUM HYDROXIDE 400 MG/5ML PO SUSP: 30.0000 mL | Freq: Every day | ORAL | Status: DC | PRN

## 2015-07-14 MED ORDER — HYDRALAZINE HCL 25 MG PO TABS
25.0000 mg | ORAL_TABLET | Freq: Three times a day (TID) | ORAL | Status: DC
  Administered 2015-07-14 – 2015-07-16 (×4): 25 mg via ORAL
  Filled 2015-07-14 (×5): qty 1

## 2015-07-14 NOTE — Progress Notes (Signed)
Pt BP remaining high. Dr. Sherryll Burger notified once more. Dr. Sherryll Burger to place orders.

## 2015-07-14 NOTE — Progress Notes (Signed)
[facility] - [place] at [facility]   PATIENT NAME: Christian [name]    MR#:  [id number]  DATE OF BIRTH:  1951-05-14  SUBJECTIVE:  requires one-on-one observation, opens his eyes to verbal commands but doesn't answer any questions. oral intake improving per his wife who is at bedside. BP up when he gets agitated REVIEW OF SYSTEMS:  Unobtainable as the patient is with altered mental status DRUG ALLERGIES:  No Known Allergies VITALS:  Blood pressure 170/107, pulse 87, temperature 98.3 F (36.8 C), temperature source Oral, resp. rate 22, height 6' (1.829 m), weight 102.286 kg (225 lb 8 oz), SpO2 100 %. PHYSICAL EXAMINATION:  GENERAL:  64 y.o.-year-old patient lying in the bed with no acute distress.  EYES: Pupils equal, round. No scleral icterus. Marland Kitchen  HEENT: Head atraumatic, normocephalic. NECK:  Supple, no jugular venous distention. No thyroid enlargement, no tenderness.  LUNGS: Normal breath sounds bilaterally, no wheezing, rales,rhonchi or crepitation. No use of accessory muscles of respiration.  CARDIOVASCULAR: S1, S2 normal. No murmurs, rubs, or gallops.  ABDOMEN: Soft, nontender, nondistended. Bowel sounds present. No organomegaly or mass.  EXTREMITIES: No pedal edema, cyanosis, or clubbing.  NEUROLOGIC: Could not assess neuro examination but seems grossly non-focal PSYCHIATRIC: The patient is alert and disoriented.  SKIN: No obvious rash, lesion, or ulcer.  LABORATORY PANEL:   CBC  Recent Labs Lab 07/14/15 0540  WBC 5.2  HGB 15.1  HCT 45.4  PLT 206   ------------------------------------------------------------------------------------------------------------------  Chemistries   Recent Labs Lab 07/09/15 0146  07/14/15 0540  NA 137  < > 136  K 3.7  < > 3.8  CL 98*  < > 97*  CO2 29  < > 24  GLUCOSE 296*  < > 232*  BUN 26*  < > 25*  CREATININE 1.67*  < > 1.74*  CALCIUM 9.0  < > 8.8*  AST 40  --   --   ALT 25  --   --   ALKPHOS 94  --   --    BILITOT 0.6  --   --   < > = values in this interval not displayed. ------------------------------------------------------------------------------------------------------------------  ASSESSMENT AND PLAN:  IMPRESSION AND PLAN:  * Acute cystitis continue IV Rocephin - can stop it tomorrow  *Acute depression with poor by mouth intake and failure to thrive Appreciate psychiatry recommendations, started the patient on Celexa and Zyprexa as per their recommendations Continue Seroquel Per Dr Guss Bunde - once medically clear - she will need transfer to inpt psych here.  * Acute on chronic renal failure secondary to dehydration and poor by mouth intake  Baseline creatinine 1.25 in September 2015 currently 1.74 Provide IV hydration, avoid nephrotoxic medication, follow renal function.  * Diabetes Hold any baseline medication because of decreased oral intake, insulin sliding scale coverage for hyperglycemia.  *Uncontrolled hypertension  continue metoprolol, add hydralazine for better control.  * hyperlipidemia cont statin.  *Acute depression with poor by mouth intake and failure to thrive follow-up with psychiatry  started the patient on Celexa and Zyprexa as per their recommendations Continue Seroquel  *Failure to thrive with poor by mouth intake continue IV fluids Consult dietitian regarding calorie count   All the records are reviewed and case discussed with Care Management/Social Worker. Management plans discussed with the patient, family (wife) and they are in agreement.  CODE STATUS: Full code  TOTAL TIME TAKING CARE OF THIS PATIENT: 35 minutes.   POSSIBLE D/C IN 1-2 DAYS, DEPENDING ON CLINICAL CONDITION.  And Psych eval. Prefer he gets transferred their as Dr Guss Bunde listed in C/S   [facility], Tranika Scholler M.D on 07/14/2015 at 4:04 PM  Between 7am to 6pm - Pager - [phone] After 6pm go to [email or web address] - password [email or web address] [facility]  [place] [facility]  Office   [phone]  CC: Primary care physician; Leanor Rubenstein, MD

## 2015-07-14 NOTE — Progress Notes (Signed)
Pt had IV antibiotic due. Pt pulled IV out last night and after several attempts from different nurses and Nurse Supervisor the evening Nurse received an order to leave IV out. Dr. Clint Guy was made aware of this and orders were changed to PO antibiotic. Dr. Clint Guy was also made aware of the pt BP being elevated throughout day. Orders were given to contact MD greater than  180 systolic 110 dystolic. New orders were made aware to Night RN.

## 2015-07-14 NOTE — Progress Notes (Signed)
No information faxed to next level of care pt discharge to medical floor °

## 2015-07-14 NOTE — Progress Notes (Signed)
Pt BP and HR elevated. Pt took some of AM Medication  and refused others. Medication  were mixed in apple sause and Primary RN unable to assess what was given and what was not given. Dr. Sherryll Burger made aware of situation and orders received for medication for BP. Primary RN to continue to monitor.

## 2015-07-14 NOTE — Progress Notes (Signed)
Pt BP remained elevated. Dr. Sherryll Burger made aware. Dr. Sherryll Burger ok with BP of BP 157/105. Primary RN to continue to monitor.

## 2015-07-14 NOTE — Progress Notes (Signed)
Pt BP elevated after pt received Metoprolol; Pt agitated; Dr. Sherryll Burger notified; Orders received for IM haldol. Primary nurse to continue to monitor.

## 2015-07-14 NOTE — Consult Note (Signed)
  S Pt seen in R.No 224. Pt is still with one on one observation for safety. Sitter reports that pt has been eating food and drinking fluids and juices. Discussed with Floor Physician about transfer of pt to [facility] unit after he is medically cleared and stable and bed is available on BH. O - Pt is seen sitting up in chair eating food. No agitation. Alert and ox3  Affect is flat and mood is low and down and depressed. Still feels h/h and w/u. Denies a/v hallucinations or delusional ideas. Denies s/h ideas rn does not talk about the same to the sitter or undersigned. I/J guarded. Impulse control is poor. Imp MDD recurrent. Rec Continue current meds and transfer to [facility] when pt is medically cleared and stable and bed is available and ready.

## 2015-07-14 NOTE — Progress Notes (Addendum)
Inpatient Diabetes Program Recommendations  AACE/ADA: New Consensus Statement on Inpatient Glycemic Control (2013)  Target Ranges:  Prepandial:   less than 140 mg/dL      Peak postprandial:   less than 180 mg/dL (1-2 hours)      Critically ill patients:  140 - 180 mg/dL   Referral received.  Referral received and chart reviewed.  Results for ALFONSA, TISDEL (MRN [id number]) as of 07/14/2015 12:59  Ref. Range 07/13/2015 11:34 07/13/2015 16:27 07/13/2015 22:15 07/14/2015 07:37 07/14/2015 11:43  Glucose-Capillary Latest Ref Range: 65-99 mg/dL 997 (H) 741 (H) 423 (H) 223 (H) 289 (H)  Results for KRUZE, VOLZ (MRN [id number]) as of 07/14/2015 12:59  Ref. Range 07/11/2015 14:40  Hemoglobin A1C Latest Ref Range: 4.0-6.0 % 7.5 (H)    Diabetes history: Type 2 diabetes Outpatient Diabetes medications: Glucotrol XL 5 mg daily Current orders for Inpatient glycemic control:  Novolog sensitive tid with meals, Glucotrol XL 5 mg daily  Note CBG's trending up today.  A1C indicates fair control of blood sugars prior to admit.  CBG's now trending up. May consider adding low dose basal insulin while patient is in the hospital, consider Lantus 15 units daily.  Based on A1C, doubt that patient will need insulin at home however would likely benefit from changes in oral medications for diabetes.   Note that Seroquel can increase blood sugar levels.    Thanks, Beryl Meager, RN, BC-ADM Inpatient Diabetes Coordinator Pager [phone] (8a-5p)

## 2015-07-14 NOTE — Progress Notes (Signed)
Calorie count ordered per Dr Amado Coe.  Discussed with RN, Viviann Spare and sitter. Envelope placed in room with calorie count instructions.   RD to follow-up regarding results of calorie count  Heddy Vidana B. Freida Busman, RD, LDN [phone] (pager)

## 2015-07-15 LAB — BASIC METABOLIC PANEL
Anion gap: 9 (ref 5–15)
BUN: 25 mg/dL — ABNORMAL HIGH (ref 6–20)
CO2: 26 mmol/L (ref 22–32)
Calcium: 8.5 mg/dL — ABNORMAL LOW (ref 8.9–10.3)
Chloride: 102 mmol/L (ref 101–111)
Creatinine, Ser: 1.28 mg/dL — ABNORMAL HIGH (ref 0.61–1.24)
GFR calc Af Amer: >60 mL/min (ref >60)
GFR calc non Af Amer: 59 mL/min — ABNORMAL LOW (ref >60)
Glucose, Bld: 196 mg/dL — ABNORMAL HIGH (ref 65–99)
Potassium: 4.3 mmol/L (ref 3.5–5.1)
Sodium: 137 mmol/L (ref 135–145)

## 2015-07-15 LAB — CBC
HCT: 39.5 % — ABNORMAL LOW (ref 40.0–52.0)
Hemoglobin: 13.4 g/dL (ref 13.0–18.0)
MCH: 30 pg (ref 26.0–34.0)
MCHC: 33.9 g/dL (ref 32.0–36.0)
MCV: 88.4 fL (ref 80.0–100.0)
Platelets: 179 10*3/uL (ref 150–440)
RBC: 4.47 MIL/uL (ref 4.40–5.90)
RDW: 12.4 % (ref 11.5–14.5)
WBC: 4.7 10*3/uL (ref 3.8–10.6)

## 2015-07-15 LAB — GLUCOSE, CAPILLARY
Glucose-Capillary: 173 mg/dL — ABNORMAL HIGH (ref 65–99)
Glucose-Capillary: 178 mg/dL — ABNORMAL HIGH (ref 65–99)
Glucose-Capillary: 237 mg/dL — ABNORMAL HIGH (ref 65–99)

## 2015-07-15 MED ORDER — ALUM & MAG HYDROXIDE-SIMETH 200-200-20 MG/5ML PO SUSP: 30.0000 mL | ORAL | Status: DC | PRN

## 2015-07-15 MED ORDER — MAGNESIUM HYDROXIDE 400 MG/5ML PO SUSP: 30.0000 mL | Freq: Every day | ORAL | Status: DC | PRN

## 2015-07-15 MED ORDER — HYDRALAZINE HCL 25 MG PO TABS: 25.0000 mg | ORAL_TABLET | Freq: Three times a day (TID) | ORAL | Status: DC

## 2015-07-15 MED ORDER — ACETAMINOPHEN 325 MG PO TABS: 650.0000 mg | ORAL_TABLET | Freq: Four times a day (QID) | ORAL | Status: DC | PRN

## 2015-07-15 NOTE — Progress Notes (Signed)
LCSW consulted with Delice Bison to inquire about patient process to be sent from 2nd floor to BMU. Delice Bison will call med Secretary to inquire about the process.

## 2015-07-15 NOTE — Care Management Important Message (Signed)
Important Message  Patient Details  Name: Christian [name] MRN: [id number] Date of Birth: 22-Mar-1951   Medicare Important Message Given:  Yes-second notification given    [name] 07/15/2015, 10:52 AM

## 2015-07-15 NOTE — BHH Counselor (Signed)
Dr. Guss Bunde contacted writer to inquire about Pt transfer to [facility]. Writer informed Dr. Guss Bunde that no beds were available at this time. Writer consulted with Social Work (Claudine) to inquire if Pt should be referred out for placement by TTS.

## 2015-07-15 NOTE — Progress Notes (Signed)
Patient is medically clear.  This was already discussed with psychiatry Dr. Guss Bunde who has accepted the patient.  Once bed is available this patient can be transferred to behavioral medicine.

## 2015-07-15 NOTE — Progress Notes (Signed)
[facility] - [place] at [facility]   PATIENT NAME: Christian [name]    MR#:  [id number]  DATE OF BIRTH:  [birth year]  SUBJECTIVE:  About same REVIEW OF SYSTEMS:  Unobtainable as the patient is with altered mental status DRUG ALLERGIES:  No Known Allergies VITALS:  Blood pressure 131/93, pulse 129, temperature 98 F (36.7 C), temperature source Axillary, resp. rate 20, height 6' (1.829 m), weight 102.286 kg (225 lb 8 oz), SpO2 98 %. PHYSICAL EXAMINATION:  GENERAL:  64 y.o.-year-old patient lying in the bed with no acute distress.  EYES: Pupils equal, round. No scleral icterus. Marland Kitchen  HEENT: Head atraumatic, normocephalic. NECK:  Supple, no jugular venous distention. No thyroid enlargement, no tenderness.  LUNGS: Normal breath sounds bilaterally, no wheezing, rales,rhonchi or crepitation. No use of accessory muscles of respiration.  CARDIOVASCULAR: S1, S2 normal. No murmurs, rubs, or gallops.  ABDOMEN: Soft, nontender, nondistended. Bowel sounds present. No organomegaly or mass.  EXTREMITIES: No pedal edema, cyanosis, or clubbing.  NEUROLOGIC: Could not assess neuro examination but seems grossly non-focal PSYCHIATRIC: The patient is alert and disoriented.  SKIN: No obvious rash, lesion, or ulcer.  LABORATORY PANEL:   CBC  Recent Labs Lab 07/15/15 0431  WBC 4.7  HGB 13.4  HCT 39.5*  PLT 179   ------------------------------------------------------------------------------------------------------------------  Chemistries   Recent Labs Lab 07/09/15 0146  07/15/15 0431  NA 137  < > 137  K 3.7  < > 4.3  CL 98*  < > 102  CO2 29  < > 26  GLUCOSE 296*  < > 196*  BUN 26*  < > 25*  CREATININE 1.67*  < > 1.28*  CALCIUM 9.0  < > 8.5*  AST 40  --   --   ALT 25  --   --   ALKPHOS 94  --   --   BILITOT 0.6  --   --   < > = values in this interval not  displayed. ------------------------------------------------------------------------------------------------------------------  ASSESSMENT AND PLAN:  IMPRESSION AND PLAN:  * Acute cystitis Stop all antibiotics today  *Acute depression with poor by mouth intake and failure to thrive Appreciate psychiatry recommendations, started the patient on Celexa and Zyprexa as per their recommendations Continue Seroquel Patient is medically clear and is waiting for psychiatry bed to open up and he can be transferred there  * Acute on chronic renal failure secondary to dehydration and poor by mouth intake  Baseline creatinine 1.25 in September 2015 currently 1.74 Provide IV hydration, avoid nephrotoxic medication, follow renal function.  * Diabetes Hold any baseline medication because of decreased oral intake, insulin sliding scale coverage for hyperglycemia.  *Uncontrolled hypertension  continue metoprolol, add hydralazine for better control.  * hyperlipidemia cont statin.  *Acute depression with poor by mouth intake and failure to thrive follow-up with psychiatry  started the patient on Celexa and Zyprexa as per their recommendations Continue Seroquel  *Failure to thrive with poor by mouth intake continue IV fluids Consult dietitian regarding calorie count   All the records are reviewed and case discussed with Care Management/Social Worker. Management plans discussed with the patient, family (wife) and they are in agreement.  CODE STATUS: Full code  TOTAL TIME TAKING CARE OF THIS PATIENT: 35 minutes.   POSSIBLE D/C EITHER TONIGHT OR HOPEFULLY TOMORROW, accepted by Dr. Guss Bunde - waiting for psychiatry bed to open up   [facility], Christian [name] M.D on 07/15/2015 at 5:09 PM  Between 7am to 6pm - Pager - [phone]  After 6pm go to [email or web address] - password [email or web address] [facility]  [place] [facility]  Office  [phone]  CC: Primary care physician; Leanor Rubenstein, MD

## 2015-07-15 NOTE — Discharge Instructions (Signed)

## 2015-07-15 NOTE — Progress Notes (Signed)
Pt refusing to take medications (did take hydralazine with encouragement)  - refusing to let staff take a full set of vitals - refused CBG check at 2200.

## 2015-07-16 ENCOUNTER — Encounter: Payer: Self-pay | Admitting: Psychiatry

## 2015-07-16 ENCOUNTER — Inpatient Hospital Stay
Admit: 2015-07-16 | Discharge: 2015-07-29 | DRG: 885 | Payer: Commercial Managed Care - HMO | Source: Intra-hospital | Attending: Internal Medicine | Admitting: Internal Medicine

## 2015-07-16 DIAGNOSIS — F419 Anxiety disorder, unspecified: Secondary | ICD-10-CM | POA: Diagnosis present

## 2015-07-16 DIAGNOSIS — Z809 Family history of malignant neoplasm, unspecified: Secondary | ICD-10-CM | POA: Diagnosis not present

## 2015-07-16 DIAGNOSIS — R200 Anesthesia of skin: Secondary | ICD-10-CM

## 2015-07-16 DIAGNOSIS — M1A9XX0 Chronic gout, unspecified, without tophus (tophi): Secondary | ICD-10-CM | POA: Diagnosis not present

## 2015-07-16 DIAGNOSIS — F323 Major depressive disorder, single episode, severe with psychotic features: Secondary | ICD-10-CM | POA: Diagnosis not present

## 2015-07-16 DIAGNOSIS — N179 Acute kidney failure, unspecified: Secondary | ICD-10-CM | POA: Diagnosis not present

## 2015-07-16 DIAGNOSIS — Z8261 Family history of arthritis: Secondary | ICD-10-CM

## 2015-07-16 DIAGNOSIS — E785 Hyperlipidemia, unspecified: Secondary | ICD-10-CM | POA: Diagnosis not present

## 2015-07-16 DIAGNOSIS — Z452 Encounter for adjustment and management of vascular access device: Secondary | ICD-10-CM | POA: Diagnosis not present

## 2015-07-16 DIAGNOSIS — I509 Heart failure, unspecified: Secondary | ICD-10-CM | POA: Diagnosis not present

## 2015-07-16 DIAGNOSIS — E119 Type 2 diabetes mellitus without complications: Secondary | ICD-10-CM | POA: Diagnosis not present

## 2015-07-16 DIAGNOSIS — Z7982 Long term (current) use of aspirin: Secondary | ICD-10-CM | POA: Diagnosis not present

## 2015-07-16 DIAGNOSIS — I129 Hypertensive chronic kidney disease with stage 1 through stage 4 chronic kidney disease, or unspecified chronic kidney disease: Secondary | ICD-10-CM | POA: Diagnosis present

## 2015-07-16 DIAGNOSIS — R509 Fever, unspecified: Secondary | ICD-10-CM | POA: Diagnosis not present

## 2015-07-16 DIAGNOSIS — F315 Bipolar disorder, current episode depressed, severe, with psychotic features: Secondary | ICD-10-CM | POA: Diagnosis not present

## 2015-07-16 DIAGNOSIS — E118 Type 2 diabetes mellitus with unspecified complications: Secondary | ICD-10-CM | POA: Diagnosis not present

## 2015-07-16 DIAGNOSIS — E43 Unspecified severe protein-calorie malnutrition: Secondary | ICD-10-CM | POA: Diagnosis not present

## 2015-07-16 DIAGNOSIS — T148 Other injury of unspecified body region: Secondary | ICD-10-CM | POA: Diagnosis not present

## 2015-07-16 DIAGNOSIS — F061 Catatonic disorder due to known physiological condition: Secondary | ICD-10-CM | POA: Diagnosis not present

## 2015-07-16 DIAGNOSIS — I878 Other specified disorders of veins: Secondary | ICD-10-CM | POA: Diagnosis present

## 2015-07-16 DIAGNOSIS — E860 Dehydration: Secondary | ICD-10-CM | POA: Diagnosis not present

## 2015-07-16 DIAGNOSIS — N390 Urinary tract infection, site not specified: Secondary | ICD-10-CM | POA: Diagnosis present

## 2015-07-16 DIAGNOSIS — Z820 Family history of epilepsy and other diseases of the nervous system: Secondary | ICD-10-CM

## 2015-07-16 DIAGNOSIS — Z8249 Family history of ischemic heart disease and other diseases of the circulatory system: Secondary | ICD-10-CM | POA: Diagnosis not present

## 2015-07-16 DIAGNOSIS — N183 Chronic kidney disease, stage 3 (moderate): Secondary | ICD-10-CM | POA: Diagnosis present

## 2015-07-16 DIAGNOSIS — R569 Unspecified convulsions: Secondary | ICD-10-CM | POA: Diagnosis not present

## 2015-07-16 DIAGNOSIS — I10 Essential (primary) hypertension: Secondary | ICD-10-CM | POA: Diagnosis not present

## 2015-07-16 DIAGNOSIS — Z79899 Other long term (current) drug therapy: Secondary | ICD-10-CM

## 2015-07-16 DIAGNOSIS — R45851 Suicidal ideations: Secondary | ICD-10-CM | POA: Diagnosis present

## 2015-07-16 DIAGNOSIS — M109 Gout, unspecified: Secondary | ICD-10-CM | POA: Diagnosis present

## 2015-07-16 DIAGNOSIS — F312 Bipolar disorder, current episode manic severe with psychotic features: Principal | ICD-10-CM | POA: Diagnosis present

## 2015-07-16 DIAGNOSIS — Z915 Personal history of self-harm: Secondary | ICD-10-CM

## 2015-07-16 DIAGNOSIS — J9811 Atelectasis: Secondary | ICD-10-CM | POA: Diagnosis not present

## 2015-07-16 DIAGNOSIS — S90822A Blister (nonthermal), left foot, initial encounter: Secondary | ICD-10-CM | POA: Diagnosis present

## 2015-07-16 DIAGNOSIS — R4182 Altered mental status, unspecified: Secondary | ICD-10-CM

## 2015-07-16 DIAGNOSIS — G4733 Obstructive sleep apnea (adult) (pediatric): Secondary | ICD-10-CM | POA: Diagnosis present

## 2015-07-16 DIAGNOSIS — I5030 Unspecified diastolic (congestive) heart failure: Secondary | ICD-10-CM

## 2015-07-16 DIAGNOSIS — F332 Major depressive disorder, recurrent severe without psychotic features: Secondary | ICD-10-CM | POA: Diagnosis not present

## 2015-07-16 DIAGNOSIS — F322 Major depressive disorder, single episode, severe without psychotic features: Secondary | ICD-10-CM | POA: Diagnosis not present

## 2015-07-16 LAB — GLUCOSE, CAPILLARY: Glucose-Capillary: 239 mg/dL — ABNORMAL HIGH (ref 65–99)

## 2015-07-16 LAB — URINE CULTURE
Culture: NO GROWTH
Special Requests: NORMAL

## 2015-07-16 MED ORDER — LORAZEPAM 2 MG/ML IJ SOLN
2.0000 mg | INTRAMUSCULAR | Status: DC | PRN
  Administered 2015-07-16 – 2015-07-20 (×2): 2 mg via INTRAMUSCULAR
  Filled 2015-07-16 (×3): qty 1

## 2015-07-16 MED ORDER — AMLODIPINE BESYLATE 10 MG PO TABS
10.0000 mg | ORAL_TABLET | Freq: Every day | ORAL | Status: DC
  Administered 2015-07-17 – 2015-07-21 (×5): 10 mg via ORAL
  Filled 2015-07-16 (×6): qty 1

## 2015-07-16 MED ORDER — HALOPERIDOL LACTATE 5 MG/ML IJ SOLN
INTRAMUSCULAR | Status: AC
  Filled 2015-07-16: qty 1

## 2015-07-16 MED ORDER — HALOPERIDOL LACTATE 5 MG/ML IJ SOLN
10.0000 mg | Freq: Four times a day (QID) | INTRAMUSCULAR | Status: DC | PRN
  Administered 2015-07-16 (×2): 10 mg via INTRAMUSCULAR
  Filled 2015-07-16: qty 2

## 2015-07-16 MED ORDER — HALOPERIDOL LACTATE 5 MG/ML IJ SOLN
INTRAMUSCULAR | Status: AC
  Administered 2015-07-16: 13:00:00
  Filled 2015-07-16: qty 1

## 2015-07-16 MED ORDER — LORAZEPAM 2 MG/ML IJ SOLN
2.0000 mg | Freq: Four times a day (QID) | INTRAMUSCULAR | Status: DC | PRN
  Administered 2015-07-16: 2 mg via INTRAMUSCULAR

## 2015-07-16 MED ORDER — ATORVASTATIN CALCIUM 20 MG PO TABS
10.0000 mg | ORAL_TABLET | Freq: Every day | ORAL | Status: DC
  Administered 2015-07-16 – 2015-07-17 (×2): 10 mg via ORAL
  Administered 2015-07-17: 20 mg via ORAL
  Administered 2015-07-18 – 2015-07-21 (×4): 10 mg via ORAL
  Filled 2015-07-16 (×6): qty 1

## 2015-07-16 MED ORDER — CHOLECALCIFEROL 10 MCG (400 UNIT) PO TABS
400.0000 [IU] | ORAL_TABLET | Freq: Every day | ORAL | Status: DC
  Administered 2015-07-17 – 2015-07-21 (×5): 400 [IU] via ORAL
  Filled 2015-07-16 (×6): qty 1

## 2015-07-16 MED ORDER — ASPIRIN 81 MG PO CHEW
81.0000 mg | CHEWABLE_TABLET | Freq: Every day | ORAL | Status: DC
  Administered 2015-07-17 – 2015-07-29 (×10): 81 mg via ORAL
  Filled 2015-07-16 (×12): qty 1

## 2015-07-16 MED ORDER — HYDRALAZINE HCL 25 MG PO TABS
50.0000 mg | ORAL_TABLET | Freq: Four times a day (QID) | ORAL | Status: DC
  Administered 2015-07-16 – 2015-07-21 (×17): 50 mg via ORAL
  Filled 2015-07-16 (×18): qty 2

## 2015-07-16 MED ORDER — DIPHENHYDRAMINE HCL 50 MG/ML IJ SOLN
50.0000 mg | Freq: Four times a day (QID) | INTRAMUSCULAR | Status: DC | PRN
  Administered 2015-07-16 (×2): 50 mg via INTRAMUSCULAR
  Filled 2015-07-16: qty 1

## 2015-07-16 MED ORDER — ALUM & MAG HYDROXIDE-SIMETH 200-200-20 MG/5ML PO SUSP: 30.0000 mL | ORAL | Status: DC | PRN

## 2015-07-16 MED ORDER — MAGNESIUM HYDROXIDE 400 MG/5ML PO SUSP
30.0000 mL | Freq: Every day | ORAL | Status: DC | PRN
Start: 2015-07-16 — End: 2015-07-29
  Administered 2015-07-21: 30 mL via ORAL
  Filled 2015-07-16: qty 30

## 2015-07-16 MED ORDER — GLIPIZIDE ER 5 MG PO TB24
5.0000 mg | ORAL_TABLET | Freq: Every day | ORAL | Status: DC
  Administered 2015-07-17 – 2015-07-20 (×4): 5 mg via ORAL
  Filled 2015-07-16 (×4): qty 1

## 2015-07-16 MED ORDER — QUETIAPINE FUMARATE 200 MG PO TABS
400.0000 mg | ORAL_TABLET | Freq: Every day | ORAL | Status: DC
  Administered 2015-07-16 – 2015-07-22 (×6): 400 mg via ORAL
  Filled 2015-07-16 (×8): qty 2

## 2015-07-16 MED ORDER — MIRTAZAPINE 30 MG PO TABS
30.0000 mg | ORAL_TABLET | Freq: Every day | ORAL | Status: DC
  Administered 2015-07-16 – 2015-07-28 (×12): 30 mg via ORAL
  Filled 2015-07-16 (×14): qty 1

## 2015-07-16 MED ORDER — TRAZODONE HCL 50 MG PO TABS
50.0000 mg | ORAL_TABLET | Freq: Three times a day (TID) | ORAL | Status: DC
  Administered 2015-07-16 – 2015-07-17 (×3): 50 mg via ORAL
  Filled 2015-07-16 (×3): qty 1

## 2015-07-16 MED ORDER — METOPROLOL SUCCINATE ER 100 MG PO TB24
200.0000 mg | ORAL_TABLET | Freq: Every day | ORAL | Status: DC
  Administered 2015-07-17 – 2015-07-29 (×9): 200 mg via ORAL
  Filled 2015-07-16 (×9): qty 2
  Filled 2015-07-16: qty 8
  Filled 2015-07-16 (×3): qty 2

## 2015-07-16 MED ORDER — ACETAMINOPHEN 325 MG PO TABS
650.0000 mg | ORAL_TABLET | Freq: Four times a day (QID) | ORAL | Status: DC | PRN
  Administered 2015-07-18 – 2015-07-23 (×3): 650 mg via ORAL
  Filled 2015-07-16 (×4): qty 2

## 2015-07-16 NOTE — B[facility] Group Notes (Signed)
B[facility] LCSW Group Therapy  07/16/2015 3:32 PM  Type of Therapy:  Group Therapy  Participation Level:  Did Not Attend due to acuity   Lulu Riding, MSW, LCSWA 07/16/2015, 3:32 PM

## 2015-07-16 NOTE — [facility] Suicide Risk Assessment (Signed)
[facility] Admission Suicide Risk Assessment   Nursing information obtained from:    Demographic factors:    Current Mental Status:    Loss Factors:    Historical Factors:    Risk Reduction Factors:    Total Time spent with patient: 1 hour Principal Problem: Bipolar disorder, current episode depressed, severe, with psychotic features Diagnosis:   Patient Active Problem List   Diagnosis Date Noted  . Bipolar disorder, current episode depressed, severe, with psychotic features [F31.5] 07/09/2015  . Diabetes [E11.9] 07/09/2015  . UTI (urinary tract infection) [N39.0] 06/19/2014  . Positive blood culture [R78.81] 06/19/2014  . Bipolar affective disorder, current episode manic with psychotic symptoms [F31.2] 06/17/2014  . Type II or unspecified type diabetes mellitus with unspecified complication, uncontrolled [E11.8] 06/02/2014  . Other and unspecified hyperlipidemia [E78.5] 06/02/2014  . OSA on CPAP [G47.33] 06/02/2014  . 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 Gout [M10.9] 06/02/2014  . Hypertension [I10]      Continued Clinical Symptoms:    The "Alcohol Use Disorders Identification Test", Guidelines for Use in Primary Care, Second Edition.  World Science writer [facility]). Score between 0-7:  no or low risk or alcohol related problems. Score between 8-15:  moderate risk of alcohol related problems. Score between 16-19:  high risk of alcohol related problems. Score 20 or above:  warrants further diagnostic evaluation for alcohol dependence and treatment.   CLINICAL FACTORS:   Bipolar Disorder:   Depressive phase   Musculoskeletal: Strength & Muscle Tone: within normal limits Gait & Station: normal Patient leans: N/A  Psychiatric Specialty Exam: I reviewed physical exam performed on the medical floor and agree with the findings. Physical Exam  Nursing note and vitals reviewed.   Review of Systems  All other systems reviewed  and are negative.   There were no vitals taken for this visit.There is no weight on file to calculate BMI.  General Appearance: Casual  Eye Contact::  Minimal  Speech:  Blocked  Volume:  Decreased  Mood:  Anxious  Affect:  Inappropriate  Thought Process:  Unable to assess  Orientation:  Other:  Unable to assess  Thought Content:  Hallucinations: Auditory Visual  Suicidal Thoughts:  No  Homicidal Thoughts:  No  Memory:  Immediate;   Unable to assess Recent;   Unable to assess Remote;   Unable to assess  Judgement:  Poor  Insight:  Lacking  Psychomotor Activity:  Decreased  Concentration:  Poor  Recall:  Poor  Fund of Knowledge:Poor  Language: Poor  Akathisia:  No  Handed:  Right  AIMS (if indicated):     Assets:  Financial Resources/Insurance Social Support  Sleep:     Cognition: Impaired,  Moderate  ADL's:  Intact     COGNITIVE FEATURES THAT CONTRIBUTE TO RISK:  None    SUICIDE RISK:   Moderate:  Frequent suicidal ideation with limited intensity, and duration, some specificity in terms of plans, no associated intent, good self-control, limited dysphoria/symptomatology, some risk factors present, and identifiable protective factors, including available and accessible social support.  PLAN OF CARE: Hospital admission, medication management, discharge planning that may include placement.  Medical Decision Making:  New problem, with additional work up planned, Review of Psycho-Social Stressors (1), Review or order clinical lab tests (1), Review of Medication Regimen & Side Effects (2) and Review of New Medication or Change in Dosage (2)   [name] is an 64 year old male with a history of bipolar illness  admitted for worsening of depression, psychosis, and agitated behavior. We start Trazodone for aggressive behavior.  1. Agitation. The patient became agitated on the medical floor informed that he would be trusted to psychiatry. He requires a one-to-one Comptroller. He was  given a combination of Haldol, Ativan, and Benadryl IM with much improvement. When necessary medications available.  2. Mood and psychosis. The patient has been maintained on Seroquel 400 mg nightly for mood stabilization and Remeron for depression. Zyprexa and Haldol were used to address agitation on the medical floor. We will try switching to Haldol. He used to take Lamictal but this was discontinued on the medical floor. The patient may be a candidate for therefore I will not restart Lamictal.  3. ECT. Will contact his wife. I will ask Dr. Toni Amend for a consultation.  4. Hypertension we will continue her Dilantin, metoprolol, Norvasc. Along with aspirin.  5. Dyslipidemia. He is on Lipitor.  6. Diabetes. He is on glipizide with Accu-Cheks.  7. Disposition. To be established. He will most likely will return to home with his wife. Placement is not out of the question.  I certify that inpatient services furnished can reasonably be expected to improve the patient's condition.   Christian [name] 07/16/2015, 1:20 PM

## 2015-07-16 NOTE — Progress Notes (Addendum)
Pt has been waiting for more than 48 hrs to go inpt behavioral medicine. Not sure what's holding the patient here but he's been medically clear and to my knowledge Psychiatry has accepted this patient. (what I've been told Dr Guss Bunde)  He belongs to inpt behavioral medicine. [facility] process for transfer is more streamlined for appropriate patient care.

## 2015-07-16 NOTE — Progress Notes (Signed)
Patient remains standing in hall near his room. Incontinent of large amount of urine in hallway. Patient refusing incontinent cares, change of clothes and toileting at this time. Patient refusing lunch at this time. Patient is alert and oriented  x 2 stating he is in [facility]. Takes a few sips of water when offered by staff. Staff and housekeeping clean floor. Patient is placed on falls risk. Allows unit bracelet,refuses falls bracelet.

## 2015-07-16 NOTE — Progress Notes (Signed)
Recreation Therapy Notes  Date: 08.17.16 Time: 3:00 pm Location: Craft Room  Group Topic: Self-esteem, Coping skills  Goal Area(s) Addresses:  Patient will identify positive attributes about self. Patient will identify at least one coping skill.  Behavioral Response: Did not attend  Intervention: All About Me  Activity: Patients were instructed to make a pamphlet including their life's motto, positive things about themselves, healthy coping skills, and their healthy support system   Education: LRT educated patients on ways they can increase their self-esteem.  Education Outcome: Patient did not attend group.  Clinical Observations/Feedback: Patient did not attend group.  Jacquelynn Cree, LRT/CTRS 07/16/2015 3:39 PM

## 2015-07-16 NOTE — Progress Notes (Signed)
Patient unable to participate in admission interview and assessment. Patient unable to sign treatment agreement, restrictive procedure form and fall prevention form.

## 2015-07-16 NOTE — Progress Notes (Signed)
Rechecked BP - very elevated - Dr. Clint Guy notified - discussed pt refusing meds PO and having no IV access with refusal to start IV - MD acknowledged and RN will continue to monitor.

## 2015-07-16 NOTE — Progress Notes (Signed)
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 Alert, oriented to name only. Patient is confused and agitated. Patient is placed in room 15 and is restless. Partially dressed. 2 staff change patient shirt and pant with patient participating, but then patient pulls shirt off. No wounds or bruises noted at this time. No contraband found at this time.  Patient unable to sit in recliner, look out window, or engage in conversation. Restless, and ambulates to hall with slow, steady gait and stands in hall. Then agrees to sit in recliner.  MD orders Haldol, Ativan and Benydryl IM and meds administered with good effect. No aggression toward others, no self harming behaviors.  Patient non verbal, blunted expression. Unable to participate in admission interview or assessment at this. Food and fluids offered. Safety maintained.

## 2015-07-16 NOTE — Progress Notes (Signed)
Patient remains unable to participate in recommended plan of care. Patient takes sips of fluids, takes evening meds with applesauce. Patient refuses to put on shirt. Remains confused and repeatedly pulls pants down. Encouraged and redirected by staff with poor effect. Patient remains on falls and 1:1 with staff. Safety  Maintained.

## 2015-07-16 NOTE — Progress Notes (Signed)
Given Haldol 2 mg IM due to agitation per Dr. Glynis Smiles.  Patient has been visually hallucinating. Awaiting for Behavioral Health admission.

## 2015-07-16 NOTE — Plan of Care (Signed)
Problem: Consults Goal: Suicide Risk Patient Education (See Patient Education module for education specifics) Outcome: Not Progressing Patient alert, oriented x 2, remains confused and unable to participate in plan of care. Remains 1:1 with staff at this time, for safety.

## 2015-07-16 NOTE — Tx Team (Signed)
Initial Interdisciplinary Treatment Plan   PATIENT STRESSORS: Health problems Medication change or noncompliance   PATIENT STRENGTHS: Capable of independent living Physical Health Supportive family/friends   PROBLEM LIST: Problem List/Patient Goals Date to be addressed Date deferred Reason deferred Estimated date of resolution  Depression   07/16/15          Bipolar   07/16/15                                         DISCHARGE CRITERIA:  Ability to meet basic life and health needs Adequate post-discharge living arrangements Improved stabilization in mood, thinking, and/or behavior  PRELIMINARY DISCHARGE PLAN: Attend aftercare/continuing care group Attend PHP/IOP Return to previous living arrangement  PATIENT/FAMIILY INVOLVEMENT: This treatment plan has been presented to and reviewed with the patient, Christian [name], and/or family member, \  .  The patient and family have been given the opportunity to ask questions and make suggestions.  Christian [name] 07/16/2015, 5:43 PM

## 2015-07-16 NOTE — Progress Notes (Signed)
Patient is delusional stating " the police (the security officers on unit) shot me in the chest. Support of patient by staff with good effect at this time.

## 2015-07-16 NOTE — H&P (Signed)
Psychiatric Admission Assessment Adult  Patient Identification: Christian [name] MRN:  [id number] Date of Evaluation:  07/16/2015 Chief Complaint:  Bipolar Disorder Principal Diagnosis: Bipolar disorder, current episode depressed, severe, with psychotic features Diagnosis:   Patient Active Problem List   Diagnosis Date Noted  . Bipolar disorder, current episode depressed, severe, with psychotic features [F31.5] 07/09/2015  . Diabetes [E11.9] 07/09/2015  . UTI (urinary tract infection) [N39.0] 06/19/2014  . Positive blood culture [R78.81] 06/19/2014  . Bipolar affective disorder, current episode manic with psychotic symptoms [F31.2] 06/17/2014  . Type II or unspecified type diabetes mellitus with unspecified complication, uncontrolled [E11.8] 06/02/2014  . Other and unspecified hyperlipidemia [E78.5] 06/02/2014  . OSA on CPAP [G47.33] 06/02/2014  . 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 Gout [M10.9] 06/02/2014  . Hypertension [I10]    History of Present Illness::   Identifying data. Christian [name] is a 64 year old man with a history of bipolar illness.  Chief complaint. The patient is unable to state.  History of present illness. Christian [name] has a long history of bipolar illness. He was recently discharged from [facility] where he was admitted for depression and suicidal ideation. On the way home and agitated and his wife brought him to the emergency room here. He was briefly admitted to psychiatry for depression and suicidal ideation with a plan to treat him with ECT. He was transferred to medical floor to treat acute UTI. Reportedly there were no behavioral problems while on the medical floor but the patient was minimally interactive with staff and mostly mute. He is UTI and dehydration once treated the patient returns to psychiatry for further treatment. When the patient learned that he started to psychiatry became agitated and  required a lot of effort to calm down. Once transferred to psychiatry he became catatonic, unable to speak or move. He appeared frightened and attending to internal stimuli. He required IM injection of Haldol, Ativan and Benadryl to calm him down and relax. He is unable to provide any information. The information is obtained from the chart and his wife.Christian [name] has a long history of bipolar illness with episodes of both severe depression and mania. He has been stable on his medications for several years now but several weeks ago he attempted suicide. Since then he's been increasingly depressed and dysfunctional. He has been increasingly depressed with delusions of guilt. He hardly sleeps at all and mostly mumbles. He seems to remember that he was treated with lithium in the past and it was not good for him. He received ECT treatment in the past that was helpful. e. There is no heightened anxiety. There is no history of substance use.  Past psychiatric history. No history of bipolar disorder with both psychotic depression and mania. He has been tried on multiple medications but cannot name any of them. There was at least one course of ECT in the past that could have possibly been helpful. There were suicide attempts. He's been hospitalized several times I see at least 4 prior admissions to [facility] system.  Family psychiatric history. Unknown.   Social history. He lives with his wife who is supportive. He is retired. He has health insurance.  ETotal Time spent with patient: 1 hour  Past Medical History:  Past Medical History  Diagnosis Date  . Mood swings   . Type II or unspecified type diabetes mellitus with unspecified complication, uncontrolled 06/02/2014  . Other and unspecified hyperlipidemia 06/02/2014  .  OSA on CPAP 06/02/2014  . 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 Bipolar disorder   . CHF (congestive heart failure)   . CKD (chronic kidney disease),  stage III   . Diabetes mellitus without complication   . Hypertension   . Gout   . Bipolar 1 disorder   . OSA on CPAP    History reviewed. No pertinent past surgical history. Family History:  Family History  Problem Relation Age of Onset  . Dementia Mother   . Arthritis Father   . Heart failure Neg Hx   . Kidney failure Neg Hx   . Cancer Neg Hx    Social History:  History  Alcohol Use No     History  Drug Use No    Social History   Social History  . Marital Status: Married    Spouse Name: N/A  . Number of Children: N/A  . Years of Education: N/A   Social History Main Topics  . Smoking status: Never Smoker   . Smokeless tobacco: None  . Alcohol Use: No  . Drug Use: No  . Sexual Activity: Not Currently   Other Topics Concern  . None   Social History Narrative   ** Merged History Encounter **       Additional Social History:                          Musculoskeletal: Strength & Muscle Tone: within normal limits Gait & Station: normal Patient leans: N/A  Psychiatric Specialty Exam: Physical Exam  Nursing note and vitals reviewed.   Review of Systems  All other systems reviewed and are negative.   There were no vitals taken for this visit.There is no weight on file to calculate BMI.  See SRA.                                                  Sleep:      Risk to Self:   Risk to Others:   Prior Inpatient Therapy:   Prior Outpatient Therapy:    Alcohol Screening:    Allergies:  No Known Allergies Lab Results:  Results for orders placed or performed during the hospital encounter of 07/11/15 (from the past 48 hour(s))  Glucose, capillary     Status: Abnormal   Collection Time: 07/14/15  4:28 PM  Result Value Ref Range   Glucose-Capillary 185 (H) 65 - 99 mg/dL  Glucose, capillary     Status: Abnormal   Collection Time: 07/14/15 11:22 PM  Result Value Ref Range   Glucose-Capillary 220 (H) 65 - 99 mg/dL  CBC      Status: Abnormal   Collection Time: 07/15/15  4:31 AM  Result Value Ref Range   WBC 4.7 3.8 - 10.6 K/uL   RBC 4.47 4.40 - 5.90 MIL/uL   Hemoglobin 13.4 13.0 - 18.0 g/dL   HCT 39.5 (L) 40.0 - 52.0 %   MCV 88.4 80.0 - 100.0 fL   MCH 30.0 26.0 - 34.0 pg   MCHC 33.9 32.0 - 36.0 g/dL   RDW 12.4 11.5 - 14.5 %   Platelets 179 150 - 440 K/uL  Basic metabolic panel     Status: Abnormal   Collection Time: 07/15/15  4:31 AM  Result Value Ref Range  Sodium 137 135 - 145 mmol/L   Potassium 4.3 3.5 - 5.1 mmol/L   Chloride 102 101 - 111 mmol/L   CO2 26 22 - 32 mmol/L   Glucose, Bld 196 (H) 65 - 99 mg/dL   BUN 25 (H) 6 - 20 mg/dL   Creatinine, Ser 1.28 (H) 0.61 - 1.24 mg/dL   Calcium 8.5 (L) 8.9 - 10.3 mg/dL   GFR calc non Af Amer 59 (L) >60 mL/min   GFR calc Af Amer >60 >60 mL/min    Comment: (NOTE) The eGFR has been calculated using the CKD EPI equation. This calculation has not been validated in all clinical situations. eGFR's persistently <60 mL/min signify possible Chronic Kidney Disease.    Anion gap 9 5 - 15  Glucose, capillary     Status: Abnormal   Collection Time: 07/15/15  7:33 AM  Result Value Ref Range   Glucose-Capillary 173 (H) 65 - 99 mg/dL  Glucose, capillary     Status: Abnormal   Collection Time: 07/15/15 11:19 AM  Result Value Ref Range   Glucose-Capillary 178 (H) 65 - 99 mg/dL  Glucose, capillary     Status: Abnormal   Collection Time: 07/15/15  4:39 PM  Result Value Ref Range   Glucose-Capillary 237 (H) 65 - 99 mg/dL   Comment 1 Notify RN   Glucose, capillary     Status: Abnormal   Collection Time: 07/16/15  7:52 AM  Result Value Ref Range   Glucose-Capillary 239 (H) 65 - 99 mg/dL   Current Medications: Current Facility-Administered Medications  Medication Dose Route Frequency Provider Last Rate Last Dose  . acetaminophen (TYLENOL) tablet 650 mg  650 mg Oral Q6H PRN Jolanta B Pucilowska, MD      . alum & mag hydroxide-simeth (MAALOX/MYLANTA) 200-200-20  MG/5ML suspension 30 mL  30 mL Oral Q4H PRN Clovis Fredrickson, MD      . Derrill Memo ON 07/17/2015] amLODipine (NORVASC) tablet 10 mg  10 mg Oral Daily Clovis Fredrickson, MD      . Derrill Memo ON 07/17/2015] aspirin chewable tablet 81 mg  81 mg Oral Daily Jolanta B Pucilowska, MD      . atorvastatin (LIPITOR) tablet 10 mg  10 mg Oral q1800 Jolanta B Pucilowska, MD      . Derrill Memo ON 07/17/2015] cholecalciferol (VITAMIN D) tablet 400 Units  400 Units Oral Daily Jolanta B Pucilowska, MD      . diphenhydrAMINE (BENADRYL) injection 50 mg  50 mg Intramuscular Q6H PRN Jolanta B Pucilowska, MD   50 mg at 07/16/15 1225  . [START ON 07/17/2015] glipiZIDE (GLUCOTROL XL) 24 hr tablet 5 mg  5 mg Oral Q breakfast Jolanta B Pucilowska, MD      . haloperidol lactate (HALDOL) injection 10 mg  10 mg Intramuscular Q6H PRN Jolanta B Pucilowska, MD   10 mg at 07/16/15 1225  . hydrALAZINE (APRESOLINE) tablet 50 mg  50 mg Oral 4 times per day Clovis Fredrickson, MD   50 mg at 07/16/15 1215  . LORazepam (ATIVAN) injection 2 mg  2 mg Intramuscular Q6H PRN Clovis Fredrickson, MD   2 mg at 07/16/15 1225  . magnesium hydroxide (MILK OF MAGNESIA) suspension 30 mL  30 mL Oral Daily PRN Clovis Fredrickson, MD      . Derrill Memo ON 07/17/2015] metoprolol succinate (TOPROL-XL) 24 hr tablet 200 mg  200 mg Oral Daily Jolanta B Pucilowska, MD      . mirtazapine (REMERON) tablet 30 mg  30 mg Oral QHS Jolanta B Pucilowska, MD      . QUEtiapine (SEROQUEL) tablet 400 mg  400 mg Oral QHS Jolanta B Pucilowska, MD      . traZODone (DESYREL) tablet 50 mg  50 mg Oral TID Clovis Fredrickson, MD       Facility-Administered Medications Ordered in Other Encounters  Medication Dose Route Frequency Provider Last Rate Last Dose  . haloperidol lactate (HALDOL) 5 MG/ML injection            PTA Medications: Prescriptions prior to admission  Medication Sig Dispense Refill Last Dose  . allopurinol (ZYLOPRIM) 300 MG tablet Take 1 tablet (300 mg total) by  mouth daily. For gout     . amLODipine (NORVASC) 10 MG tablet Take 10 mg by mouth daily.   unknown at unknown  . aspirin EC 81 MG tablet Take 81 mg by mouth daily.   unknown at unknown  . atorvastatin (LIPITOR) 10 MG tablet Take 1 tablet (10 mg total) by mouth daily. For high cholesterol   unknown  . cholecalciferol (VITAMIN D) 400 UNITS TABS tablet Take 800 Units by mouth daily.   unknown at unknown  . colchicine 0.6 MG tablet Take 1 tablet (0.6 mg total) by mouth 2 (two) times daily. For Gout pain   unknown  . furosemide (LASIX) 20 MG tablet Take 20 mg by mouth daily.   unknown at unknown  . glipiZIDE (GLUCOTROL XL) 5 MG 24 hr tablet Take 5 mg by mouth daily.   unknown at unknown  . hydrALAZINE (APRESOLINE) 50 MG tablet Take 1 tablet (50 mg total) by mouth every 6 (six) hours. 120 tablet 0   . lamoTRIgine (LAMICTAL) 100 MG tablet Take 1 tablet (100 mg total) by mouth every evening. 30 tablet 0 unknown at unknown  . lurasidone (LATUDA) 40 MG TABS tablet Take 40 mg by mouth every evening.   unknown at unknown  . metoprolol (TOPROL-XL) 200 MG 24 hr tablet Take 1 tablet (200 mg total) by mouth daily. For high blood pressure   unknown  . mirtazapine (REMERON) 30 MG tablet Take 30 mg by mouth at bedtime.     Marland Kitchen QUEtiapine (SEROQUEL) 400 MG tablet Take 400 mg by mouth at bedtime.    unknown at unknown  . traZODone (DESYREL) 100 MG tablet Take 2 tablets (200 mg total) by mouth at bedtime. For sleep 30 tablet 0 unknown at unknown    Previous Psychotropic Medications: Yes   Substance Abuse History in the last 12 months:  No.    Consequences of Substance Abuse: NA  Results for orders placed or performed during the hospital encounter of 07/11/15 (from the past 72 hour(s))  Glucose, capillary     Status: Abnormal   Collection Time: 07/13/15  4:27 PM  Result Value Ref Range   Glucose-Capillary 122 (H) 65 - 99 mg/dL   Comment 1 Notify RN   Glucose, capillary     Status: Abnormal   Collection Time:  07/13/15 10:15 PM  Result Value Ref Range   Glucose-Capillary 195 (H) 65 - 99 mg/dL  CBC     Status: None   Collection Time: 07/14/15  5:40 AM  Result Value Ref Range   WBC 5.2 3.8 - 10.6 K/uL   RBC 4.97 4.40 - 5.90 MIL/uL   Hemoglobin 15.1 13.0 - 18.0 g/dL   HCT 45.4 40.0 - 52.0 %   MCV 91.3 80.0 - 100.0 fL   MCH 30.4 26.0 - 34.0  pg   MCHC 33.3 32.0 - 36.0 g/dL   RDW 12.4 11.5 - 14.5 %   Platelets 206 150 - 440 K/uL  Basic metabolic panel     Status: Abnormal   Collection Time: 07/14/15  5:40 AM  Result Value Ref Range   Sodium 136 135 - 145 mmol/L   Potassium 3.8 3.5 - 5.1 mmol/L   Chloride 97 (L) 101 - 111 mmol/L   CO2 24 22 - 32 mmol/L   Glucose, Bld 232 (H) 65 - 99 mg/dL   BUN 25 (H) 6 - 20 mg/dL   Creatinine, Ser 1.74 (H) 0.61 - 1.24 mg/dL   Calcium 8.8 (L) 8.9 - 10.3 mg/dL   GFR calc non Af Amer 41 (L) >60 mL/min   GFR calc Af Amer 47 (L) >60 mL/min    Comment: (NOTE) The eGFR has been calculated using the CKD EPI equation. This calculation has not been validated in all clinical situations. eGFR's persistently <60 mL/min signify possible Chronic Kidney Disease.    Anion gap 15 5 - 15  Glucose, capillary     Status: Abnormal   Collection Time: 07/14/15  7:37 AM  Result Value Ref Range   Glucose-Capillary 223 (H) 65 - 99 mg/dL  Glucose, capillary     Status: Abnormal   Collection Time: 07/14/15 11:43 AM  Result Value Ref Range   Glucose-Capillary 289 (H) 65 - 99 mg/dL  Urine culture     Status: None   Collection Time: 07/14/15 12:13 PM  Result Value Ref Range   Specimen Description URINE, RANDOM    Special Requests Normal    Culture NO GROWTH 1 DAY    Report Status 07/16/2015 FINAL   Glucose, capillary     Status: Abnormal   Collection Time: 07/14/15  4:28 PM  Result Value Ref Range   Glucose-Capillary 185 (H) 65 - 99 mg/dL  Glucose, capillary     Status: Abnormal   Collection Time: 07/14/15 11:22 PM  Result Value Ref Range   Glucose-Capillary 220 (H) 65  - 99 mg/dL  CBC     Status: Abnormal   Collection Time: 07/15/15  4:31 AM  Result Value Ref Range   WBC 4.7 3.8 - 10.6 K/uL   RBC 4.47 4.40 - 5.90 MIL/uL   Hemoglobin 13.4 13.0 - 18.0 g/dL   HCT 39.5 (L) 40.0 - 52.0 %   MCV 88.4 80.0 - 100.0 fL   MCH 30.0 26.0 - 34.0 pg   MCHC 33.9 32.0 - 36.0 g/dL   RDW 12.4 11.5 - 14.5 %   Platelets 179 150 - 440 K/uL  Basic metabolic panel     Status: Abnormal   Collection Time: 07/15/15  4:31 AM  Result Value Ref Range   Sodium 137 135 - 145 mmol/L   Potassium 4.3 3.5 - 5.1 mmol/L   Chloride 102 101 - 111 mmol/L   CO2 26 22 - 32 mmol/L   Glucose, Bld 196 (H) 65 - 99 mg/dL   BUN 25 (H) 6 - 20 mg/dL   Creatinine, Ser 1.28 (H) 0.61 - 1.24 mg/dL   Calcium 8.5 (L) 8.9 - 10.3 mg/dL   GFR calc non Af Amer 59 (L) >60 mL/min   GFR calc Af Amer >60 >60 mL/min    Comment: (NOTE) The eGFR has been calculated using the CKD EPI equation. This calculation has not been validated in all clinical situations. eGFR's persistently <60 mL/min signify possible Chronic Kidney Disease.    Anion  gap 9 5 - 15  Glucose, capillary     Status: Abnormal   Collection Time: 07/15/15  7:33 AM  Result Value Ref Range   Glucose-Capillary 173 (H) 65 - 99 mg/dL  Glucose, capillary     Status: Abnormal   Collection Time: 07/15/15 11:19 AM  Result Value Ref Range   Glucose-Capillary 178 (H) 65 - 99 mg/dL  Glucose, capillary     Status: Abnormal   Collection Time: 07/15/15  4:39 PM  Result Value Ref Range   Glucose-Capillary 237 (H) 65 - 99 mg/dL   Comment 1 Notify RN   Glucose, capillary     Status: Abnormal   Collection Time: 07/16/15  7:52 AM  Result Value Ref Range   Glucose-Capillary 239 (H) 65 - 99 mg/dL    Observation Level/Precautions:  1 to 1  Laboratory:  CBC Chemistry Profile UDS UA  Psychotherapy:    Medications:    Consultations:    Discharge Concerns:    Estimated LOS:  Other:     Psychological Evaluations: No   Treatment Plan  Summary: Daily contact with patient to assess and evaluate symptoms and progress in treatment and Medication management  Medical Decision Making:  New problem, with additional work up planned, Review of Psycho-Social Stressors (1), Review or order clinical lab tests (1), Review of Medication Regimen & Side Effects (2) and Review of New Medication or Change in Dosage (2)   Christian [name] is an 64 year old male with a history of bipolar illness admitted for worsening of depression, psychosis, and agitated behavior. We start Trazodone for aggressive behavior.  1. Agitation. The patient became agitated on the medical floor informed that he would be trusted to psychiatry. He requires a one-to-one Actuary. He was given a combination of Haldol, Ativan, and Benadryl IM with much improvement. When necessary medications available.  2. Mood and psychosis. The patient has been maintained on Seroquel 400 mg nightly for mood stabilization and Remeron for depression. Zyprexa and Haldol were used to address agitation on the medical floor. We will try switching to Haldol. He used to take Lamictal but this was discontinued on the medical floor. The patient may be a candidate for therefore I will not restart Lamictal.  3. ECT. Will contact his wife. I will ask Dr. Weber Cooks for a consultation.  4. Hypertension we will continue her Dilantin, metoprolol, Norvasc. Along with aspirin.  5. Dyslipidemia. He is on Lipitor.  6. Diabetes. He is on glipizide with Accu-Cheks.  7. Disposition. To be established. He will most likely will return to home with his wife. Placement is not out of the question.    I certify that inpatient services furnished can reasonably be expected to improve the patient's condition.   Jolanta Pucilowska 8/17/20161:30 PM

## 2015-07-16 NOTE — Progress Notes (Signed)
2 C phones and reports they do not have any of Patient's clothes. No clothes arrived to [facility] with patient. 1 pair of eye glasses with patient in room.

## 2015-07-16 NOTE — [facility] Group Notes (Signed)
[facility] LCSW Aftercare Discharge Planning Group Note   07/16/2015 3:32 PM  Participation Quality:  Patient not able to attend at this time due to acuity   Lulu Riding, MSW, [employer]

## 2015-07-16 NOTE — Progress Notes (Signed)
Patient's wife [employer] (with safety code), asks for update, visiting hours and reports that patient had 1 outfit (blue pant, blue shirt) with him on 2 C. Writer phones 2 C [phone]) requesting clothes be sent to [facility].

## 2015-07-17 LAB — FOLATE: Folate: 20.3 ng/mL (ref >5.9)

## 2015-07-17 LAB — VITAMIN B12: Vitamin B-12: 1877 pg/mL — ABNORMAL HIGH (ref 180–914)

## 2015-07-17 MED ORDER — COLCHICINE 0.6 MG PO TABS
0.6000 mg | ORAL_TABLET | Freq: Two times a day (BID) | ORAL | Status: DC
  Administered 2015-07-17 – 2015-07-21 (×8): 0.6 mg via ORAL
  Filled 2015-07-17 (×9): qty 1

## 2015-07-17 MED ORDER — ALLOPURINOL 300 MG PO TABS
300.0000 mg | ORAL_TABLET | Freq: Every day | ORAL | Status: DC
  Administered 2015-07-17 – 2015-07-29 (×10): 300 mg via ORAL
  Filled 2015-07-17 (×12): qty 1

## 2015-07-17 MED ORDER — TRAZODONE HCL 50 MG PO TABS
25.0000 mg | ORAL_TABLET | Freq: Three times a day (TID) | ORAL | Status: DC
  Administered 2015-07-17 – 2015-07-29 (×25): 25 mg via ORAL
  Filled 2015-07-17 (×12): qty 1
  Filled 2015-07-17: qty 2
  Filled 2015-07-17 (×3): qty 1
  Filled 2015-07-17 (×2): qty 2
  Filled 2015-07-17 (×4): qty 1
  Filled 2015-07-17: qty 2
  Filled 2015-07-17: qty 1
  Filled 2015-07-17: qty 2
  Filled 2015-07-17 (×2): qty 1

## 2015-07-17 MED ORDER — FUROSEMIDE 20 MG PO TABS
20.0000 mg | ORAL_TABLET | Freq: Every day | ORAL | Status: DC
  Administered 2015-07-17 – 2015-07-19 (×3): 20 mg via ORAL
  Filled 2015-07-17 (×3): qty 1

## 2015-07-17 MED ORDER — GLUCERNA SHAKE PO LIQD
237.0000 mL | Freq: Three times a day (TID) | ORAL | Status: DC
  Administered 2015-07-17 – 2015-07-28 (×19): 237 mL via ORAL
  Administered 2015-07-29: 18 mL via ORAL
  Administered 2015-07-29 (×2): 237 mL via ORAL

## 2015-07-17 MED ORDER — TUBERCULIN PPD 5 UNIT/0.1ML ID SOLN
5.0000 [IU] | Freq: Once | INTRADERMAL | Status: AC
  Administered 2015-07-17: 5 [IU] via INTRADERMAL
  Filled 2015-07-17: qty 0.1

## 2015-07-17 NOTE — Progress Notes (Signed)
Chart reviewed with Dr. Toni Amend;  PRNs last received, VS, acute confusion discussed. Order frequency of Ativan IM changed to every two hours as needed.

## 2015-07-17 NOTE — [facility] Group Notes (Signed)
[facility] LCSW Group Therapy  07/17/2015 3:19 PM  Type of Therapy:  Group Therapy  Participation Level:  Did Not Attend  Modes of Intervention:  Discussion, Education, Socialization and Support  Summary of Progress/Problems: Balance in life: Patients will discuss the concept of balance and how it looks and feels to be unbalanced. Pt will identify areas in their life that is unbalanced and ways to become more balanced.    Jamilette Suchocki L Damesha Lawler MSW, LCSWA  07/17/2015, 3:19 PM 

## 2015-07-17 NOTE — Progress Notes (Addendum)
Patient remained in room today.  He was extremely sleepy and slept most of the day.  Patient was in sitting in recliner chair for most of the day.  Sitter at bedside.

## 2015-07-17 NOTE — BHH Counselor (Signed)
Adult Comprehensive Assessment  Patient ID: Christian [name], male DOB: Dec 12, 1950, 64 y.o. MRN: [id number]  Information Source: Information source: Patient and Wife Agricultural engineer   Current Stressors:  Family Relationships: Pt relationship son in Social worker, daughter and wife is strained due to pt. Sister attempted suicide a few weeks prior to him.  Financial / Lack of resources (include bankruptcy): Pt reports limited financial resources  Living/Environment/Situation:  Living Arrangements: Spouse/significant other Living conditions (as described by patient or guardian): Pt currently lives in home with wife, daughter, and son in law which he describes as a chaotic environment as pt reports that son in law does not contribute to house hold. How long has patient lived in current situation?: 5 years What is atmosphere in current home: Chaotic  Family History:  Marital status: Married Number of Years Married: 37 What types of issues is patient dealing with in the relationship?: Pt reports that relationship with wife is strained as "she chose him (son-in-law) over me" Does patient have children?: Yes How many children?: 1 How is patient's relationship with their children?: Pt relationship with daughter is strained due to pt not agreeing with daughter marriage to son in law  Childhood History:  By whom was/is the patient raised?: Both parents Description of patient's relationship with caregiver when they were a child: "Good. My mother was wonderful. I loved my mother to death until she died. My father and I have had a few bumps in the road because we are both very opinionated." Patient's description of current relationship with people who raised him/her: Relationship with father is currently stained due to a recent argument. Does patient have siblings?: Yes Number of Siblings: 7 Description of patient's current relationship with siblings: Close and loving Did patient suffer any  verbal/emotional/physical/sexual abuse as a child?: No Did patient suffer from severe childhood neglect?: No Has patient ever been sexually abused/assaulted/raped as an adolescent or adult?: No Was the patient ever a victim of a crime or a disaster?: No Witnessed domestic violence?: No Has patient been effected by domestic violence as an adult?: No  Education:  Highest grade of school patient has completed: BS degree in Engineer, site Currently a student?: No Learning disability?: No  Employment/Work Situation:  Employment situation: On disability Why is patient on disability: "I am on disability because of my mental state at the time" How long has patient been on disability: 3- 4 years Patient's job has been impacted by current illness: Yes Describe how patient's job has been impacted: Pt reports inability to deal with high levels of stress What is the longest time patient has a held a job?: 7-8 years 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 Does patient have a Lawyer or guardian?: No  Alcohol/Substance Abuse:  What has been your use of drugs/alcohol within the last 12 months?: Pt denies If attempted suicide, did drugs/alcohol play a role in this?: No Alcohol/Substance Abuse Treatment Hx: Denies past history Has alcohol/substance abuse ever caused legal problems?: No  Social Support System:  Conservation officer, nature Support System: Fair Development worker, community Support System: Wife and few friends  Type of faith/religion: [employer]ri[employer] Knuckles How does patient's faith help to cope with current illness?: Prayer  Leisure/Recreation:  Leisure and Hobbies: Singing, listening to music, and reading  Strengths/Needs:  What things does the patient do well?: Singing, song writing, and helping others In what areas does patient struggle / problems for patient: "Nothing now, we were  struggling financially.  But when I get out of here I'll have so much money from suing you all."  Discharge Plan:  Does patient have access to transportation?: Yes Will patient be returning to same living situation after discharge?: Yes Currently receiving community mental health services: Yes (From Whom) (Dr. Emerson [name]) If no, would patient like referral for services when discharged?: Yes (What county?) ([place]) Does patient have financial barriers related to discharge medications?: No  Summary/Recommendations:  Summary and Recommendations (to be completed by the evaluator): Christian [name] is a 64 year old male with history of depression, many suicide attempts as well as psychiatric hospitalizations. According to his wife, he told her he attempted to hang himself multiple times the week of July 30th. He was admitted to [facility]. He was discharged from there but wife was concerned due to agitation and pacing so she brought him to [facility]. Christian [name] was unable to provide adequate information therefore collateral information was provided by his wife. She reports he has attempted suicide many times using different methods every time. He has attempted to overdose, hang himself, and he [place] his [place] and tried to drown himself. Prior to admission he spoke of leaving the generator running in the house as a possible plan for suicide.  Wife reports his sister was hospitalized for a suicide attempt a few weeks ago. She believes this may have been a stressor. Also, he believe catastrophes that he sees on the news are his fault and that no one loves him. He has feeling of hopelessness and worthlessness. Pt plans to return home and follow up with outpatient. He is followed by Dr. Nolen [name]. Recommendations include; crisis stabilization, medication management, therapeutic milieu, and encourage group attendance and participation.    Christian [name] MSW, [place] 07/18/2015

## 2015-07-17 NOTE — Plan of Care (Signed)
Problem: Ineffective individual coping Goal: STG: Patient will remain free from self harm Outcome: Progressing Pt has remained free from falls, 1:1 safety observation continues for safety.

## 2015-07-17 NOTE — Progress Notes (Signed)
PRNs given at 19:51, patient still refused scrubs, writer joined the other nursing staffs to maintain safety; patient noted with sweats, skin clammy and cold, CBG =237 @ 20:16. Water offered, patient persuade to sit in the recliner as he started to respond to PRNs. Complied, slept in the recliner, "[facility]" unlocked; patient transferred to room after bedtime medications and remained on 1:1 Observation.

## 2015-07-17 NOTE — Progress Notes (Signed)
Recreation Therapy Notes  Date: 08.18.16 Time: 3:00 pm Location: Craft Room  Group Topic: Leisure Education  Goal Area(s) Addresses:  Patient will identify activities for each letter of the alphabet. Patient will verbalize ability to integrate positive leisure into life post d/c. Patient will verbalize ability to use leisure as a coping mechanism.  Behavioral Response: Did not attend  Intervention: Leisure Alphabet  Activity: Patients were given a leisure alphabet worksheet and instructed to list healthy leisure activities for each letter of the alphabet.  Education: LRT educated patient on what is needed to participate in leisure  Education Outcome: Patient did not attend group.   Clinical Observations/Feedback: Patient did not attend group.  Jacquelynn Cree, LRT/CTRS 07/17/2015 3:58 PM

## 2015-07-17 NOTE — Progress Notes (Signed)
[facility] MD Progress Note  07/17/2015 11:23 AM Christian [name]  MRN:  [id number]  Subjective:  [name] has been restless and impossible to redirect since admission yesterday. He initially refused to come to our unit from medical floor. On the evening he disrobed himself and would not keep any clothes on. He did not want to sit or lay down. He was given injection of Haldol, Ativan and Benadryl. He reportedly slept through the night. He is still asleep sitting in a reclining chair with sitter at bedside.  Principal Problem: Bipolar disorder, current episode depressed, severe, with psychotic features Diagnosis:   Patient Active Problem List   Diagnosis Date Noted  . Bipolar disorder, current episode depressed, severe, with psychotic features [F31.5] 07/09/2015  . Diabetes [E11.9] 07/09/2015  . UTI (urinary tract infection) [N39.0] 06/19/2014  . Positive blood culture [R78.81] 06/19/2014  . Bipolar affective disorder, current episode manic with psychotic symptoms [F31.2] 06/17/2014  . Type II or unspecified type diabetes mellitus with unspecified complication, uncontrolled [E11.8] 06/02/2014  . Other and unspecified hyperlipidemia [E78.5] 06/02/2014  . OSA on CPAP [G47.33] 06/02/2014  . 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 Gout [M10.9] 06/02/2014  . Hypertension [I10]    Total Time spent with patient: 20 minutes   Past Medical History:  Past Medical History  Diagnosis Date  . Mood swings   . Type II or unspecified type diabetes mellitus with unspecified complication, uncontrolled 06/02/2014  . Other and unspecified hyperlipidemia 06/02/2014  . OSA on CPAP 06/02/2014  . 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 Bipolar disorder   . CHF (congestive heart failure)   . CKD (chronic kidney disease), stage III   . Diabetes mellitus without complication   . Hypertension   . Gout   . Bipolar 1 disorder   .  OSA on CPAP    History reviewed. No pertinent past surgical history. Family History:  Family History  Problem Relation Age of Onset  . Dementia Mother   . Arthritis Father   . Heart failure Neg Hx   . Kidney failure Neg Hx   . Cancer Neg Hx    Social History:  History  Alcohol Use No     History  Drug Use No    Social History   Social History  . Marital Status: Married    Spouse Name: N/A  . Number of Children: N/A  . Years of Education: N/A   Social History Main Topics  . Smoking status: Never Smoker   . Smokeless tobacco: None  . Alcohol Use: No  . Drug Use: No  . Sexual Activity: Not Currently   Other Topics Concern  . None   Social History Narrative   ** Merged History Encounter **       Additional History:    Sleep: Good  Appetite:  Poor   Assessment:   Musculoskeletal: Strength & Muscle Tone: within normal limits Gait & Station: normal Patient leans: N/A   Psychiatric Specialty Exam: Physical Exam  Nursing note and vitals reviewed.   Review of Systems  All other systems reviewed and are negative.   Blood pressure 163/93, pulse 93, temperature 98.6 F (37 C), temperature source Oral, resp. rate 18, SpO2 97 %.There is no weight on file to calculate BMI.  General Appearance: Bizarre  Eye Contact::  Minimal  Speech:  Blocked  Volume:  Decreased  Mood:  Anxious  Affect:  Inappropriate  Thought Process:  unable to assess  Orientation:  Other:  unable to assess  Thought Content:  unable to assess  Suicidal Thoughts:  unable to assess  Homicidal Thoughts:  unable to assess  Memory:  Immediate;   unable to assess Recent;   unable to assess Remote;   unable to assess  Judgement:  Poor  Insight:  Lacking  Psychomotor Activity:  Restlessness  Concentration:  unable to assess  Recall:  unable to assess  Fund of Knowledge:Fair  Language: Poor  Akathisia:  No  Handed:  Right  AIMS (if indicated):     Assets:  Financial  Resources/Insurance Social Support  ADL's:  Intact  Cognition: Impaired,  Severe  Sleep:  Number of Hours: 7.45     Current Medications: Current Facility-Administered Medications  Medication Dose Route Frequency Provider Last Rate Last Dose  . acetaminophen (TYLENOL) tablet 650 mg  650 mg Oral Q6H PRN Jolanta B Pucilowska, MD      . allopurinol (ZYLOPRIM) tablet 300 mg  300 mg Oral Daily Jolanta B Pucilowska, MD      . alum & mag hydroxide-simeth (MAALOX/MYLANTA) 200-200-20 MG/5ML suspension 30 mL  30 mL Oral Q4H PRN Jolanta B Pucilowska, MD      . amLODipine (NORVASC) tablet 10 mg  10 mg Oral Daily Shari Prows, MD   10 mg at 07/17/15 0956  . aspirin chewable tablet 81 mg  81 mg Oral Daily Jolanta B Pucilowska, MD   81 mg at 07/17/15 0945  . atorvastatin (LIPITOR) tablet 10 mg  10 mg Oral q1800 Jolanta B Pucilowska, MD   20 mg at 07/17/15 1000  . cholecalciferol (VITAMIN D) tablet 400 Units  400 Units Oral Daily Shari Prows, MD   400 Units at 07/17/15 0945  . colchicine tablet 0.6 mg  0.6 mg Oral BID Jolanta B Pucilowska, MD      . diphenhydrAMINE (BENADRYL) injection 50 mg  50 mg Intramuscular Q6H PRN Jolanta B Pucilowska, MD   50 mg at 07/16/15 1951  . furosemide (LASIX) tablet 20 mg  20 mg Oral Daily Jolanta B Pucilowska, MD      . glipiZIDE (GLUCOTROL XL) 24 hr tablet 5 mg  5 mg Oral Q breakfast Jolanta B Pucilowska, MD   5 mg at 07/17/15 0819  . haloperidol lactate (HALDOL) injection 10 mg  10 mg Intramuscular Q6H PRN Jolanta B Pucilowska, MD   10 mg at 07/16/15 1951  . hydrALAZINE (APRESOLINE) tablet 50 mg  50 mg Oral 4 times per day Shari Prows, MD   50 mg at 07/17/15 0816  . LORazepam (ATIVAN) injection 2 mg  2 mg Intramuscular Q2H PRN Audery Amel, MD   2 mg at 07/16/15 1950  . magnesium hydroxide (MILK OF MAGNESIA) suspension 30 mL  30 mL Oral Daily PRN Jolanta B Pucilowska, MD      . metoprolol succinate (TOPROL-XL) 24 hr tablet 200 mg  200 mg Oral  Daily Jolanta B Pucilowska, MD   200 mg at 07/17/15 0942  . mirtazapine (REMERON) tablet 30 mg  30 mg Oral QHS Shari Prows, MD   30 mg at 07/16/15 2207  . QUEtiapine (SEROQUEL) tablet 400 mg  400 mg Oral QHS Shari Prows, MD   400 mg at 07/16/15 2206  . traZODone (DESYREL) tablet 25 mg  25 mg Oral TID Shari Prows, MD        Lab Results:  Results for orders placed or performed  during the [name] encounter of 07/11/15 (from the past 48 hour(s))  Glucose, capillary     Status: Abnormal   Collection Time: 07/15/15  4:39 PM  Result Value Ref Range   Glucose-Capillary 237 (H) 65 - 99 mg/dL   Comment 1 Notify RN   Glucose, capillary     Status: Abnormal   Collection Time: 07/16/15  7:52 AM  Result Value Ref Range   Glucose-Capillary 239 (H) 65 - 99 mg/dL    Physical Findings: AIMS: Facial and Oral Movements Muscles of Facial Expression: None, normal Lips and Perioral Area: None, normal Jaw: None, normal Tongue: None, normal,Extremity Movements Upper (arms, wrists, hands, fingers): None, normal Lower (legs, knees, ankles, toes): None, normal, Trunk Movements Neck, shoulders, hips: None, normal, Overall Severity Severity of abnormal movements (highest score from questions above): None, normal Incapacitation due to abnormal movements: None, normal Patient's awareness of abnormal movements (rate only patient's report): No Awareness, Dental Status Current problems with teeth and/or dentures?: No Does patient usually wear dentures?: No  CIWA:    COWS:     Treatment Plan Summary: Daily contact with patient to assess and evaluate symptoms and progress in treatment and Medication management   Medical Decision Making:  Established Problem, Stable/Improving (1), Review of Psycho-Social Stressors (1), Review or order clinical lab tests (1), Review of Medication Regimen & Side Effects (2) and Review of New Medication or Change in Dosage (2)   [name] is an  64 year old male with a history of bipolar illness admitted for worsening of depression, psychosis, and agitated behavior.   1. Agitation. The patient became agitated on the medical floor informed that he would be trusted to psychiatry. He requires a one-to-one Comptroller. He was given a combination of Haldol, Ativan, and Benadryl IM with much improvement. When necessary medications available.We stared Trazodone for aggressive behavior.  2. Mood and psychosis. The patient has been maintained on Seroquel 400 mg nightly for mood stabilization and Remeron for depression. Zyprexa and Haldol were used to address agitation on the medical floor. We will try switching to Haldol. He used to take Lamictal but this was discontinued on the medical floor. The patient may be a candidate for therefore I will not restart Lamictal.  3. ECT. Will contact his wife. I will ask Dr. Toni Amend for a consultation.  4. Hypertension we will continue her Dilantin, metoprolol, Norvasc. Along with aspirin.  5. Dyslipidemia. He is on Lipitor.  6. Diabetes. He is on glipizide with Accu-Cheks.  7. Gout. We continue allopurinol and colchicine.  8. Edema. We will continue furosemide.   9. Disposition. To be established. He will most likely will return to home with his wife. Placement is not out of the question.     Jolanta Pucilowska 07/17/2015, 11:23 AM

## 2015-07-17 NOTE — Tx Team (Signed)
Interdisciplinary Treatment Plan Update (Adult)  Date:  07/17/2015 Time Reviewed:  2:06 PM  Progress in Treatment: Attending groups: No. Participating in groups:  No. Taking medication as prescribed:  Yes. Tolerating medication:  Yes. Family/Significant othe contact made:  No, will contact:  Wife Patient understands diagnosis:  No. and As evidenced by:  Limited income Discussing patient identified problems/goals with staff:  No. and As evidenced by:  Not speaking to anyone. Medical problems stabilized or resolved:  Yes. Denies suicidal/homicidal ideation: Yes. Issues/concerns per patient self-inventory:  No. Other:  New problem(s) identified: No, Describe:  None  Discharge Plan or Barriers: Pt plans to return home and follow up with outpatient   Reason for Continuation of Hospitalization: Depression Medication stabilization  Comments: Christian [name] has been restless and impossible to redirect since admission yesterday. He initially refused to come to our unit from medical floor. On the evening he disrobed himself and would not keep any clothes on. He did not want to sit or lay down. He was given injection of Haldol, Ativan and Benadryl. He reportedly slept through the night. He is still asleep sitting in a reclining chair with sitter at bedside.   Estimated length of stay: 7 days   New goal(s): NA  Review of initial/current patient goals per problem list:   1.  Goal(s): Patient will participate in aftercare plan * Met:  * Target date: at discharge * As evidenced by: Patient will participate within aftercare plan AEB aftercare provider and housing plan at discharge being identified.   2.  Goal (s): Patient will exhibit decreased depressive symptoms and suicidal ideations. * Met:  *  Target date: at discharge * As evidenced by: Patient will utilize self rating of depression at 3 or below and demonstrate decreased signs of depression or be deemed stable for discharge by MD.   3.   Goal(s): Patient will demonstrate decreased signs of withdrawal due to substance abuse * Met:  * Target date: at discharge * As evidenced by: Patient will produce a CIWA/COWS score of 0, have stable vitals signs, and no symptoms of withdrawal. *  Attendees: Patient:  Christian [name] 8/18/20162:06 PM  Family:   8/18/20162:06 PM  Physician:  Dr. Guadlupe Spanish  8/18/20162:06 PM  Nursing:   Randall Hiss, RN  8/18/20162:06 PM  Clinical Social Worker: [place], [place]    8/18/20162:06 PM  Counselor:   8/18/20162:06 PM  Other:  Everitt Amber, LRT  8/18/20162:06 PM  Other:   8/18/20162:06 PM  Other:   8/18/20162:06 PM  Other:  8/18/20162:06 PM  Other:  8/18/20162:06 PM  Other:  8/18/20162:06 PM  Other:  8/18/20162:06 PM  Other:  8/18/20162:06 PM  Other:  8/18/20162:06 PM  Other:   8/18/20162:06 PM   Scribe for Treatment Team:   Campbell Stall [place] Swamy,MSW, LCSWA  07/17/2015, 2:06 PM

## 2015-07-17 NOTE — [facility] Group Notes (Signed)
[facility] Group Notes:  (Nursing/MHT/Case Management/Adjunct)  Date:  07/17/2015  Time:  1:54 PM  Type of Therapy:  Psychoeducational Skills  Participation Level:  Did Not Attend   Christian [name] 07/17/2015, 1:54 PM

## 2015-07-17 NOTE — Progress Notes (Signed)
Patient on continuous 1:1 safety observation by staff in the "[facility]" locked section, naked, confused, paranoid, refused to sit down or go to his room; additional nursing staff provided to maintain safety of the milieu due to patient's potential for aggression, all efforts to re-orient and to redirect unsuccessful at this time. Chart reviewed, decision made to inform Dr. Toni Amend.

## 2015-07-17 NOTE — [facility] Group Notes (Signed)
[facility] Group Notes:  (Nursing/MHT/Case Management/Adjunct)  Date:  07/17/2015  Time:  9:43 AM  Type of Therapy:  Goal Setting  Participation Level:  Did Not Attend    Mayra Neer 07/17/2015, 9:43 AM

## 2015-07-17 NOTE — Discharge Summary (Signed)
[facility] [place] at [facility]   PATIENT NAME: Christian [name]    MR#:  [id number]  DATE OF BIRTH:  04-Nov-1951  DATE OF ADMISSION:  07/11/2015 ADMITTING PHYSICIAN: Altamese Dilling, MD  DATE OF DISCHARGE: 07/16/2015 10:52 AM  PRIMARY CARE PHYSICIAN: Leanor Rubenstein, MD   ADMISSION DIAGNOSIS:  UTI Dehydration  DISCHARGE DIAGNOSIS:  Active Problems:   UTI (urinary tract infection)  SECONDARY DIAGNOSIS:   Past Medical History  Diagnosis Date  . Mood swings   . Type II or unspecified type diabetes mellitus with unspecified complication, uncontrolled 06/02/2014  . Other and unspecified hyperlipidemia 06/02/2014  . OSA on CPAP 06/02/2014  . 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 Bipolar disorder   . CHF (congestive heart failure)   . CKD (chronic kidney disease), stage III   . Diabetes mellitus without complication   . Hypertension   . Gout   . Bipolar 1 disorder   . OSA on CPAP    HOSPITAL COURSE:  64 y.o. male with a known history of chronic kidney disease, diabetes, hypertension, bipolar disorder was originally admitted to psychiatric floor for his severe depression. He was not having good oral intake and his urine output is decreasing, his urinalysis was worrisome for UTI so was transferred to medical floor. Please see Dr Jim Desanctis dictated H & P for further details. He was treated with IV abx but biggest challenge was still his mental condition. He was also hydrated with IVF for Acute on CKD with dehydration and his renal function returned close to his baseline.   Periodically, patient was getting agitated and hallucinations for which Psych C/S was obtained who recommended transfer back to inpt psych floor once medically clear which he was on 16th Aug and was transferred back to psych floor on 17th of Aug in stable condition as bed became available. DISCHARGE CONDITIONS:  Stable medically CONSULTS OBTAINED:   Beau Fanny, MD DRUG ALLERGIES:  No Known Allergies DISCHARGE MEDICATIONS:   Discharge Medication List as of 07/16/2015 10:58 AM    CONTINUE these medications which have NOT CHANGED   Details  allopurinol (ZYLOPRIM) 300 MG tablet Take 1 tablet (300 mg total) by mouth daily. For gout, Starting 07/15/2014, Until Discontinued, No Print    amLODipine (NORVASC) 10 MG tablet Take 10 mg by mouth daily., Starting 06/23/2015, Until Discontinued, Historical Med    aspirin EC 81 MG tablet Take 81 mg by mouth daily., Until Discontinued, Historical Med    atorvastatin (LIPITOR) 10 MG tablet Take 1 tablet (10 mg total) by mouth daily. For high cholesterol, Starting 06/05/2014, Until Discontinued, No Print    cholecalciferol (VITAMIN D) 400 UNITS TABS tablet Take 800 Units by mouth daily., Until Discontinued, Historical Med    colchicine 0.6 MG tablet Take 1 tablet (0.6 mg total) by mouth 2 (two) times daily. For Gout pain, Starting 06/05/2014, Until Discontinued, No Print    furosemide (LASIX) 20 MG tablet Take 20 mg by mouth daily., Starting 06/09/2015, Until Discontinued, Historical Med    glipiZIDE (GLUCOTROL XL) 5 MG 24 hr tablet Take 5 mg by mouth daily., Starting 06/23/2015, Until Discontinued, Historical Med    hydrALAZINE (APRESOLINE) 50 MG tablet Take 1 tablet (50 mg total) by mouth every 6 (six) hours., Starting 07/15/2014, Until Discontinued, Normal    lamoTRIgine (LAMICTAL) 100 MG tablet Take 1 tablet (100 mg total) by mouth every evening., Starting 07/15/2014, Until Discontinued, Normal    lurasidone (LATUDA)  40 MG TABS tablet Take 40 mg by mouth every evening., Until Discontinued, Historical Med    metoprolol (TOPROL-XL) 200 MG 24 hr tablet Take 1 tablet (200 mg total) by mouth daily. For high blood pressure, Starting 06/05/2014, Until Discontinued, No Print    mirtazapine (REMERON) 30 MG tablet Take 30 mg by mouth at bedtime., Until Discontinued, Historical Med    QUEtiapine (SEROQUEL) 400  MG tablet Take 400 mg by mouth at bedtime. , Starting 06/23/2015, Until Discontinued, Historical Med    traZODone (DESYREL) 100 MG tablet Take 2 tablets (200 mg total) by mouth at bedtime. For sleep, Starting 06/05/2014, Until Discontinued, Normal       DISCHARGE INSTRUCTIONS:   DIET:  Regular diet DISCHARGE CONDITION:  Good ACTIVITY:  Activity as tolerated OXYGEN:  Home Oxygen: No.  Oxygen Delivery: room air DISCHARGE LOCATION:  Inpatient Behavioral medicine at [facility]   If you experience worsening of your admission symptoms, develop shortness of breath, life threatening emergency, suicidal or homicidal thoughts you must seek medical attention immediately by calling 911 or calling your MD immediately  if symptoms less severe.  You Must read complete instructions/literature along with all the possible adverse reactions/side effects for all the Medicines you take and that have been prescribed to you. Take any new Medicines after you have completely understood and accpet all the possible adverse reactions/side effects.   Please note  You were cared for by a hospitalist during your hospital stay. If you have any questions about your discharge medications or the care you received while you were in the hospital after you are discharged, you can call the unit and asked to speak with the hospitalist on call if the hospitalist that took care of you is not available. Once you are discharged, your primary care physician will handle any further medical issues. Please note that NO REFILLS for any discharge medications will be authorized once you are discharged, as it is imperative that you return to your primary care physician (or establish a relationship with a primary care physician if you do not have one) for your aftercare needs so that they can reassess your need for medications and monitor your lab values.    On the day of Discharge:   VITAL SIGNS:  Blood pressure 159/113, pulse 128,  temperature 97.8 F (36.6 C), temperature source Oral, resp. rate 20, height 6' (1.829 m), weight 102.286 kg (225 lb 8 oz), SpO2 97 %.  I/O:  No intake or output data in the 24 hours ending 07/17/15 1057  PHYSICAL EXAMINATION:  GENERAL:  64 y.o.-year-old patient lying in the bed with no acute distress.  EYES: Pupils equal, round, reactive to light and accommodation. No scleral icterus. Extraocular muscles intact.  HEENT: Head atraumatic, normocephalic. Oropharynx and nasopharynx clear.  NECK:  Supple, no jugular venous distention. No thyroid enlargement, no tenderness.  LUNGS: Normal breath sounds bilaterally, no wheezing, rales,rhonchi or crepitation. No use of accessory muscles of respiration.  CARDIOVASCULAR: S1, S2 normal. No murmurs, rubs, or gallops.  ABDOMEN: Soft, non-tender, non-distended. Bowel sounds present. No organomegaly or mass.  EXTREMITIES: No pedal edema, cyanosis, or clubbing.  NEUROLOGIC: Cranial nerves II through XII are intact. Muscle strength 5/5 in all extremities. Sensation intact. Gait not checked.  PSYCHIATRIC: The patient is alert and oriented x 3.  SKIN: No obvious rash, lesion, or ulcer.   DATA REVIEW:   CBC  Recent Labs Lab 07/15/15 0431  WBC 4.7  HGB 13.4  HCT 39.5*  PLT  179    Chemistries   Recent Labs Lab 07/15/15 0431  NA 137  K 4.3  CL 102  CO2 26  GLUCOSE 196*  BUN 25*  CREATININE 1.28*  CALCIUM 8.5*   Management plans discussed with the patient, family and they are in agreement.  CODE STATUS:   TOTAL TIME TAKING CARE OF THIS PATIENT: 55 minutes.    [facility], Zehava Turski M.D on 07/17/2015 at 10:57 AM  Between 7am to 6pm - Pager - [phone]  After 6pm go to [email or web address] - password [email or web address] [facility]  [facility]  Office  [phone]  CC: Primary care physician; Leanor Rubenstein, MD

## 2015-07-17 NOTE — Progress Notes (Signed)
Patient slept for 7.45 hours, uneventful night

## 2015-07-17 NOTE — Progress Notes (Signed)
Initial Nutrition Assessment     INTERVENTION:   Meals and Snacks: pt may benefit from paranoid trays, all items sent in close containers. May also benefit from liberalizing diet to Regular. Noted on remeron Medical Food Supplement Therapy: recommend Glucerna TID; will also order SugarFree Mighty Shakes TID as unsure if pt will drink Glucerna shakes    NUTRITION DIAGNOSIS:   Inadequate oral intake related to lethargy/confusion, acute illness as evidenced by meal completion < 25%.  GOAL:   Patient will meet greater than or equal to 90% of their needs  MONITOR:    (Energy Intake, Anthropometrics, Digestive System, Cognition, Glucose Profile, Electrolyte/Renal Profile)  REASON FOR ASSESSMENT:  MST  ASSESSMENT:      Pt admitted with bipolar disorder, current episode depressed, severe with psychotic features; noted pt has been impossible to redirect since admission yesterday.  Noted pt not wanting to keep clothes on, not wanting to sit or lay down. Pt on locked hallway with 1:1 sitter. Pt familiar to Clinical research associate as RDs following pt when he was inpatient on medical floor.Calorie count was ordered for pt as with poor po intake on that admission    Past Medical History  Diagnosis Date  . Mood swings   . Type II or unspecified type diabetes mellitus with unspecified complication, uncontrolled 06/02/2014  . Other and unspecified hyperlipidemia 06/02/2014  . OSA on CPAP 06/02/2014  . 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 Bipolar disorder   . CHF (congestive heart failure)   . CKD (chronic kidney disease), stage III   . Diabetes mellitus without complication   . Hypertension   . Gout   . Bipolar 1 disorder   . OSA on CPAP     Diet Order:  Diet Carb Modified Fluid consistency:: Thin; Room service appropriate?: Yes   Energy Intake: no recorded po intake since admission to behavioral medicine yesterday, per nsg notes, pt taking sips of liquids  Food  and Nutrition Related History: pt with poor intake during admission to medical floor; at times pt would not eating anything. Average intake per calorie count was around 350 kcals, 12 g of protein. Not taking nutritional supplements at that time per calorie count documentation, although Glucerna was ordered. Wife also reported that prior to admission and this acute episode pt had been eating fairly well  Electrolyte and Renal Profile:  Recent Labs Lab 07/13/15 0638 07/14/15 0540 07/15/15 0431  BUN 16 25* 25*  CREATININE 1.15 1.74* 1.28*  NA 138 136 137  K 3.7 3.8 4.3   Glucose Profile:   Recent Labs  07/15/15 1119 07/15/15 1639 07/16/15 0752  GLUCAP 178* 237* 239*   Protein Profile: No results for input(s): ALBUMIN in the last 168 hours.   Meds: lasix, glucotrol, remeron,   Height:   Ht Readings from Last 1 Encounters:  07/12/15 6' (1.829 m)    Weight:   Wt Readings from Last 1 Encounters:  07/12/15 225 lb 8 oz (102.286 kg)     BMI:  There is no weight on file to calculate BMI.  Estimated Nutritional Needs:   Kcal:  2022-2460 kacls (25-30 kcals/kg)   Protein:  66-82 g (0.8-1.0 g/kg)   Fluid:  2022-2460 mL of fluid  EDUCATION NEEDS:   MODERATE Care Level  [facility] MS, RD, LDN [phone] Pager

## 2015-07-18 LAB — RPR: RPR Ser Ql: NONREACTIVE

## 2015-07-18 LAB — GLUCOSE, CAPILLARY
Glucose-Capillary: 122 mg/dL — ABNORMAL HIGH (ref 65–99)
Glucose-Capillary: 180 mg/dL — ABNORMAL HIGH (ref 65–99)

## 2015-07-18 NOTE — Progress Notes (Signed)
D: Pt is asleep in bed this evening. Pt mood is apathetic and his affect is flat. Pt seems to be oriented to self and is more awake today   A: Writer provided emotional support administered medications as prescribed, crushed in apple sauce.  R: Pt has very minimal response to staff but follows simple instructions. Pt remained in bed and does not get up for the restroom sitter remains at bedside.

## 2015-07-18 NOTE — Progress Notes (Signed)
[facility] MD Progress Note  07/18/2015 7:43 PM Gaven Risko Mormile  MRN:  [id number]  Subjective:  Mr. Kovalenko is slightly better today. He is no longer agitated or somnolent. He is resting quietly in bed with a sitter at bedside. He "doesn't feel good at all" but is unable to provide any details. He is no longer confused. He still believes that there is finding some and is worried about it. He recognizes me. He knows that he received ECT in the past and that they were helpful. He was informed he would talk to Dr. Toni Amend tomorrow about the possibility of ECT treatment. The nurse reported that his utilities foul-smelling. We will ask medicine for a consultation as there are old and new problems like a blister on his foot. The patient has diabetes. Sleep was interrupted last night and he sleeps a lot during the day. His oral intake minimal but he drinks ensure. He reportedly had 360 ml of fluids today.  Principal Problem: Bipolar disorder, current episode depressed, severe, with psychotic features Diagnosis:   Patient Active Problem List   Diagnosis Date Noted  . Bipolar disorder, current episode depressed, severe, with psychotic features [F31.5] 07/09/2015  . Diabetes [E11.9] 07/09/2015  . UTI (urinary tract infection) [N39.0] 06/19/2014  . Positive blood culture [R78.81] 06/19/2014  . Bipolar affective disorder, current episode manic with psychotic symptoms [F31.2] 06/17/2014  . Type II or unspecified type diabetes mellitus with unspecified complication, uncontrolled [E11.8] 06/02/2014  . Other and unspecified hyperlipidemia [E78.5] 06/02/2014  . OSA on CPAP [G47.33] 06/02/2014  . 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 Gout [M10.9] 06/02/2014  . Hypertension [I10]    Total Time spent with patient: 20 minutes   Past Medical History:  Past Medical History  Diagnosis Date  . Mood swings   . Type II or unspecified type diabetes mellitus with  unspecified complication, uncontrolled 06/02/2014  . Other and unspecified hyperlipidemia 06/02/2014  . OSA on CPAP 06/02/2014  . 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 Bipolar disorder   . CHF (congestive heart failure)   . CKD (chronic kidney disease), stage III   . Diabetes mellitus without complication   . Hypertension   . Gout   . Bipolar 1 disorder   . OSA on CPAP    History reviewed. No pertinent past surgical history. Family History:  Family History  Problem Relation Age of Onset  . Dementia Mother   . Arthritis Father   . Heart failure Neg Hx   . Kidney failure Neg Hx   . Cancer Neg Hx    Social History:  History  Alcohol Use No     History  Drug Use No    Social History   Social History  . Marital Status: Married    Spouse Name: N/A  . Number of Children: N/A  . Years of Education: N/A   Social History Main Topics  . Smoking status: Never Smoker   . Smokeless tobacco: None  . Alcohol Use: No  . Drug Use: No  . Sexual Activity: Not Currently   Other Topics Concern  . None   Social History Narrative   ** Merged History Encounter **       Additional History:    Sleep: Poor  Appetite:  Poor   Assessment:   Musculoskeletal: Strength & Muscle Tone: within normal limits Gait & Station: normal Patient leans: N/A   Psychiatric Specialty Exam:  Physical Exam  Nursing note and vitals reviewed.   Review of Systems  Skin:       Blister of left foot.  All other systems reviewed and are negative.   Blood pressure 145/98, pulse 102, temperature 98.2 F (36.8 C), temperature source Axillary, resp. rate 18, SpO2 96 %.There is no weight on file to calculate BMI.  General Appearance: Casual  Eye Contact::  Minimal  Speech:  Slow  Volume:  Decreased  Mood:  Depressed and Hopeless  Affect:  Flat  Thought Process:  Coherent  Orientation:  Full (Time, Place, and Person)  Thought Content:  Delusions,  Hallucinations: Auditory Visual and Paranoid Ideation  Suicidal Thoughts:  No  Homicidal Thoughts:  No  Memory:  Immediate;   Poor Recent;   Poor Remote;   Poor  Judgement:  Poor  Insight:  Lacking  Psychomotor Activity:  Decreased  Concentration:  Poor  Recall:  Poor  Fund of Knowledge:Fair  Language: Fair  Akathisia:  No  Handed:  Right  AIMS (if indicated):     Assets:  Communication Skills  ADL's:  Intact  Cognition: WNL  Sleep:  Number of Hours: 7.25     Current Medications: Current Facility-Administered Medications  Medication Dose Route Frequency Provider Last Rate Last Dose  . acetaminophen (TYLENOL) tablet 650 mg  650 mg Oral Q6H PRN Shari Prows, MD   650 mg at 07/18/15 1934  . allopurinol (ZYLOPRIM) tablet 300 mg  300 mg Oral Daily Leianne Callins B Alonzo Owczarzak, MD   300 mg at 07/18/15 0810  . alum & mag hydroxide-simeth (MAALOX/MYLANTA) 200-200-20 MG/5ML suspension 30 mL  30 mL Oral Q4H PRN Kavion Mancinas B Tarsha Blando, MD      . amLODipine (NORVASC) tablet 10 mg  10 mg Oral Daily Lebaron Bautch B Xiao Graul, MD   10 mg at 07/18/15 0810  . aspirin chewable tablet 81 mg  81 mg Oral Daily Netra Postlethwait B Rockell Faulks, MD   81 mg at 07/18/15 0810  . atorvastatin (LIPITOR) tablet 10 mg  10 mg Oral q1800 Rokia Bosket B Vuong Musa, MD   10 mg at 07/18/15 1727  . cholecalciferol (VITAMIN D) tablet 400 Units  400 Units Oral Daily Shari Prows, MD   400 Units at 07/18/15 0810  . colchicine tablet 0.6 mg  0.6 mg Oral BID Shari Prows, MD   0.6 mg at 07/18/15 0810  . diphenhydrAMINE (BENADRYL) injection 50 mg  50 mg Intramuscular Q6H PRN Shari Prows, MD   50 mg at 07/16/15 1951  . feeding supplement (GLUCERNA SHAKE) (GLUCERNA SHAKE) liquid 237 mL  237 mL Oral TID WC Nickalus Thornsberry B Anayiah Howden, MD   237 mL at 07/18/15 1726  . furosemide (LASIX) tablet 20 mg  20 mg Oral Daily Saxon Crosby B Derionna Salvador, MD   20 mg at 07/18/15 0810  . glipiZIDE (GLUCOTROL XL) 24 hr tablet 5 mg  5 mg Oral Q  breakfast Kikue Gerhart B Dontrey Snellgrove, MD   5 mg at 07/18/15 0811  . haloperidol lactate (HALDOL) injection 10 mg  10 mg Intramuscular Q6H PRN Ladonya Jerkins B Monseratt Ledin, MD   10 mg at 07/16/15 1951  . hydrALAZINE (APRESOLINE) tablet 50 mg  50 mg Oral 4 times per day Shari Prows, MD   50 mg at 07/18/15 1732  . LORazepam (ATIVAN) injection 2 mg  2 mg Intramuscular Q2H PRN Audery Amel, MD   2 mg at 07/16/15 1950  . magnesium hydroxide (MILK OF MAGNESIA) suspension 30 mL  30 mL Oral  Daily PRN Shari Prows, MD      . metoprolol succinate (TOPROL-XL) 24 hr tablet 200 mg  200 mg Oral Daily Teofila Bowery B Yazeed Pryer, MD   200 mg at 07/18/15 0825  . mirtazapine (REMERON) tablet 30 mg  30 mg Oral QHS Shari Prows, MD   30 mg at 07/17/15 2134  . QUEtiapine (SEROQUEL) tablet 400 mg  400 mg Oral QHS Shari Prows, MD   400 mg at 07/17/15 2134  . traZODone (DESYREL) tablet 25 mg  25 mg Oral TID Shari Prows, MD   25 mg at 07/18/15 1729  . tuberculin injection 5 Units  5 Units Intradermal Once Shari Prows, MD   5 Units at 07/17/15 1502    Lab Results:  Results for orders placed or performed during the hospital encounter of 07/16/15 (from the past 48 hour(s))  Vitamin B12     Status: Abnormal   Collection Time: 07/17/15  1:40 PM  Result Value Ref Range   Vitamin B-12 1877 (H) 180 - 914 pg/mL    Comment: (NOTE) This assay is not validated for testing neonatal or myeloproliferative syndrome specimens for Vitamin B12 levels. Performed at [facility]   Folate     Status: None   Collection Time: 07/17/15  1:40 PM  Result Value Ref Range   Folate 20.3 >5.9 ng/mL  RPR     Status: None   Collection Time: 07/17/15  1:40 PM  Result Value Ref Range   RPR Ser Ql Non Reactive Non Reactive    Comment: (NOTE) Performed At: [facility] [address] [place], [place] [id number] Mila Homer MD WU:[id number]   Glucose, capillary     Status: Abnormal    Collection Time: 07/18/15  6:13 AM  Result Value Ref Range   Glucose-Capillary 122 (H) 65 - 99 mg/dL    Physical Findings: AIMS: Facial and Oral Movements Muscles of Facial Expression: None, normal Lips and Perioral Area: None, normal Jaw: None, normal Tongue: None, normal,Extremity Movements Upper (arms, wrists, hands, fingers): None, normal Lower (legs, knees, ankles, toes): None, normal, Trunk Movements Neck, shoulders, hips: None, normal, Overall Severity Severity of abnormal movements (highest score from questions above): None, normal Incapacitation due to abnormal movements: None, normal Patient's awareness of abnormal movements (rate only patient's report): No Awareness, Dental Status Current problems with teeth and/or dentures?: No Does patient usually wear dentures?: No  CIWA:    COWS:     Treatment Plan Summary: Daily contact with patient to assess and evaluate symptoms and progress in treatment and Medication management   Medical Decision Making:  Established Problem, Stable/Improving (1), Review of Psycho-Social Stressors (1), Review or order clinical lab tests (1), Review of Medication Regimen & Side Effects (2) and Review of New Medication or Change in Dosage (2)   Mr. Lorusso is an 64 year old male with a history of bipolar illness admitted for worsening of depression, psychosis, and agitated behavior.   1. Agitation. The patient became agitated on the medical floor informed that he would be trusted to psychiatry. He requires a one-to-one Comptroller. He was given a combination of Haldol, Ativan, and Benadryl IM with much improvement. When necessary medications available.We stared Trazodone for aggressive behavior.  2. Mood and psychosis. The patient has been maintained on Seroquel 400 mg nightly for mood stabilization and Remeron for depression. Zyprexa and Haldol were used to address agitation on the medical floor. We will try switching to Haldol. He used to take  Lamictal  but this was discontinued on the medical floor. The patient may be a candidate for therefore I will not restart Lamictal.  3. ECT. Will contact his wife. I will ask Dr. Toni Amend for a consultation.  4. Hypertension we will continue her Dilantin, metoprolol, Norvasc. Along with aspirin.  5. Dyslipidemia. He is on Lipitor.  6. Diabetes. He is on glipizide with Accu-Cheks.  7. Gout. We continue allopurinol and colchicine.  8. Edema. We will continue furosemide.   9. UTI treated on medical floor hopefully resolved.   10. Diabetic foot. Wound care consult completed.   11. Disposition. To be established. He will most likely will return to home with his wife. Placement is not out of the question.      Ernesta Trabert 07/18/2015, 7:43 PM

## 2015-07-18 NOTE — Progress Notes (Signed)
It was noticed that pt has a large blister on the medial side of his Left ankle.  apx 4cmx6cm   Currently skin is intact.  I spoke with Dr. Jennet Maduro and an order was obtained for a wound consult.   No further/other skin breakdown noticed.  We are continuing to turn pt q 2hours and offer urinal.   Sitter at bedside.

## 2015-07-18 NOTE — [facility] Group Notes (Signed)
[facility] Group Notes:  (Nursing/MHT/Case Management/Adjunct)  Date:  07/18/2015  Time:  1:41 PM  Type of Therapy:  Group Therapy  Participation Level:  Did Not Attend  Participation Quality:    Affec  f Progress/Problems:  Mayra Neer 07/18/2015, 1:41 PM

## 2015-07-18 NOTE — Progress Notes (Signed)
Recreation Therapy Notes  At approximately 11:55 am, LRT spoke with patient's nurse regarding assessment. According to patient's nurse, patient had been sleepy and would not be appropriate for an assessment.   Jacquelynn Cree, LRT/CTRS 07/18/2015 5:30 PM

## 2015-07-18 NOTE — Progress Notes (Signed)
Recreation Therapy Notes  Date: 08.19.16 Time: 3:00 pm Location: Craft Room  Group Topic: Problem solving, communication, teamwork  Goal Area(s) Addresses:  Patient will work in teams towards shared goal. Patient will verbalize skills needed to make activity successful. Patient will verbalize benefit of using skills identified to reach post d/c goals.  Behavioral Response: Did not attend   Intervention: Landing Pad  Activity: Patients were divided into two teams and given 15 straws and approximately 2.5 feet of tape. Patients  instructed to build a landing pad to catch a golf ball that would be dropped from approximately 4 feet.  Education: LRT educated patients on why communication, problem solving, and teamwork is important.  Education Outcome: Patient did not attend group.  Clinical Observations/Feedback: Patient did not attend group.  Jacquelynn Cree, LRT/CTRS 07/18/2015 4:55 PM

## 2015-07-18 NOTE — Consult Note (Signed)
WOC wound consult note Reason for Consult: Deep Tissue Injury left medial heel.  Raised blood filled blister at this time with maroon discoloration noted in wound bed. Patient is alert and responds to requests as appropriate.  Wound type:Pressure injury, full thickness Deep Tissue Injury.  Sitter in the room said patient was repeatedly removing shoes and socks earlier in the week.   Pressure Ulcer POA: No Measurement: 4 cm x 6.2 cm raised blood filled blister to left medial heel.  Wound YKD:XIPJAS discoloration Drainage (amount, consistency, odor) Nione.  Intact at this time.  Periwound:Intact Dressing procedure/placement/frequency:Cleanse blister to left medial heel with NS and pat gently dry.  Cover with dry dressing.  Secure with tape.  May use wrap gauze/kerlix if 4x4 will not stay in place. If blister ruptures, please add calcium alginate to wound bed, cover with 4x4 gauze and kerlix.  Change daily.  Will not follow at this time.  Please re-consult if needed.  Maple Hudson RN BSN CWON Pager [phone]

## 2015-07-18 NOTE — Progress Notes (Signed)
D: Pt is asleep in bed this evening. Pt mood is apathetic and his affect is sad/depressed. Pt seems to be oriented to self and situation because he jokingly stated "get me out of this place" when asked if I could do anything for him.   A: Writer provided emotional support administered medications as prescribed, crushed in apple sauce. Writer assessed radial pulse and his HR is irregular, so I contacted the MD on call. MD instructed to follow medication regimine. Pt denies chest pain or further symptoms of MI. Writer also contacted respiratory for CPAP set up.  R: Pt has very minimal response to staff but follows simple instructions. Pt remained in bed and does not get up for the restroom and has had one episode of incontinence of urine that is foul smelling so far this evening.

## 2015-07-18 NOTE — [facility] Group Notes (Signed)
[facility] LCSW Aftercare Discharge Planning Group Note   07/18/2015 11:24 AM  Participation Quality:  Did not attend.   Ilisa Hayworth L Azalea Cedar MSW, LCSWA  

## 2015-07-19 ENCOUNTER — Encounter: Payer: Self-pay | Admitting: Internal Medicine

## 2015-07-19 LAB — BASIC METABOLIC PANEL
Anion gap: 12 (ref 5–15)
BUN: 29 mg/dL — ABNORMAL HIGH (ref 6–20)
CO2: 24 mmol/L (ref 22–32)
Calcium: 9 mg/dL (ref 8.9–10.3)
Chloride: 97 mmol/L — ABNORMAL LOW (ref 101–111)
Creatinine, Ser: 1.61 mg/dL — ABNORMAL HIGH (ref 0.61–1.24)
GFR calc Af Amer: 52 mL/min — ABNORMAL LOW (ref >60)
GFR calc non Af Amer: 45 mL/min — ABNORMAL LOW (ref >60)
Glucose, Bld: 268 mg/dL — ABNORMAL HIGH (ref 65–99)
Potassium: 4.8 mmol/L (ref 3.5–5.1)
Sodium: 133 mmol/L — ABNORMAL LOW (ref 135–145)

## 2015-07-19 LAB — CBC
HCT: 47 % (ref 40.0–52.0)
Hemoglobin: 15.5 g/dL (ref 13.0–18.0)
MCH: 29.8 pg (ref 26.0–34.0)
MCHC: 33 g/dL (ref 32.0–36.0)
MCV: 90.4 fL (ref 80.0–100.0)
Platelets: 233 10*3/uL (ref 150–440)
RBC: 5.2 MIL/uL (ref 4.40–5.90)
RDW: 12.7 % (ref 11.5–14.5)
WBC: 9.6 10*3/uL (ref 3.8–10.6)

## 2015-07-19 LAB — GLUCOSE, CAPILLARY: Glucose-Capillary: 141 mg/dL — ABNORMAL HIGH (ref 65–99)

## 2015-07-19 NOTE — Consult Note (Signed)
Reason for Consult: Large blood blister on the left heel Referring Physician: Essie Gehret Berlinger is an 64 y.o. male.  HPI: Recently admitted to behavioral medicine. Was noted to have a large blood blister on the back of his left heel. Unable to communicate any distinct injury to the foot. Recently was hospitalized at another institution. He may have rubbed the area when he was bed bound.  Past Medical History  Diagnosis Date  . Mood swings   . Type II or unspecified type diabetes mellitus with unspecified complication, uncontrolled 06/02/2014  . Other and unspecified hyperlipidemia 06/02/2014  . OSA on CPAP 06/02/2014  . 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 Bipolar disorder   . CHF (congestive heart failure)   . CKD (chronic kidney disease), stage III   . Diabetes mellitus without complication   . Hypertension   . Gout   . Bipolar 1 disorder   . OSA on CPAP     History reviewed. No pertinent past surgical history.  Family History  Problem Relation Age of Onset  . Dementia Mother   . Arthritis Father   . Heart failure Neg Hx   . Kidney failure Neg Hx   . Cancer Neg Hx     Social History:  reports that he has never smoked. He does not have any smokeless tobacco history on file. He reports that he does not drink alcohol or use illicit drugs.  Allergies: No Known Allergies  Medications: I have reviewed the patient's current medications.  Results for orders placed or performed during the hospital encounter of 07/16/15 (from the past 48 hour(s))  Glucose, capillary     Status: Abnormal   Collection Time: 07/18/15  6:13 AM  Result Value Ref Range   Glucose-Capillary 122 (H) 65 - 99 mg/dL  Glucose, capillary     Status: Abnormal   Collection Time: 07/18/15  8:35 PM  Result Value Ref Range   Glucose-Capillary 180 (H) 65 - 99 mg/dL  Glucose, capillary     Status: Abnormal   Collection Time: 07/19/15  6:12 AM  Result Value Ref  Range   Glucose-Capillary 141 (H) 65 - 99 mg/dL  CBC     Status: None   Collection Time: 07/19/15 12:21 PM  Result Value Ref Range   WBC 9.6 3.8 - 10.6 K/uL   RBC 5.20 4.40 - 5.90 MIL/uL   Hemoglobin 15.5 13.0 - 18.0 g/dL   HCT 47.0 40.0 - 52.0 %   MCV 90.4 80.0 - 100.0 fL   MCH 29.8 26.0 - 34.0 pg   MCHC 33.0 32.0 - 36.0 g/dL   RDW 12.7 11.5 - 14.5 %   Platelets 233 150 - 440 K/uL  Basic metabolic panel     Status: Abnormal   Collection Time: 07/19/15 12:21 PM  Result Value Ref Range   Sodium 133 (L) 135 - 145 mmol/L   Potassium 4.8 3.5 - 5.1 mmol/L   Chloride 97 (L) 101 - 111 mmol/L   CO2 24 22 - 32 mmol/L   Glucose, Bld 268 (H) 65 - 99 mg/dL   BUN 29 (H) 6 - 20 mg/dL   Creatinine, Ser 1.61 (H) 0.61 - 1.24 mg/dL   Calcium 9.0 8.9 - 10.3 mg/dL   GFR calc non Af Amer 45 (L) >60 mL/min   GFR calc Af Amer 52 (L) >60 mL/min    Comment: (NOTE) The eGFR has been calculated using the CKD EPI equation. This  calculation has not been validated in all clinical situations. eGFR's persistently <60 mL/min signify possible Chronic Kidney Disease.    Anion gap 12 5 - 15    No results found.  Review of Systems  Unable to perform ROS: medical condition   Blood pressure 128/90, pulse 98, temperature 98.6 F (37 C), temperature source Axillary, resp. rate 18, SpO2 95 %. Physical Exam  Cardiovascular:  DP and PT pulses are palpable but diminished. Capillary filling time appears intact  Musculoskeletal:  Adequate range of motion. Unable to perform muscle testing.  Neurological:  Unable to perform an adequate neurological exam due to the patient's inability to interact and communicate  Skin:  The skin is warm dry and supple. There is a large intact blood blister on the posterior aspect of the left heel. No surrounding erythema or signs of purulence or infection.     Assessment/Plan: Assessment: Intact blood blister left heel with diabetes mellitus  Plan: Continue to keep covered  with a bulky gauze dressing for protection. If the blister opens and begins to drain we will begin soaking in Epsom salt water and covering with Bactroban and a light gauze bandage. At that point we may need to deep roof the blister. For now we will leave it intact and monitor. Reconsult if the blister breaks down.  Christian [name] W. 07/19/2015, 5:41 PM

## 2015-07-19 NOTE — Consult Note (Signed)
History and Physical    Romulus Hanrahan Vallier WUJ:[id number] DOB: 1950/12/13 DOA: 07/16/2015  Referring physician: Dr. Guss Bunde PCP: Leanor Rubenstein, MD  Specialists: none  Chief Complaint: anorexia  HPI: Kenlee Maler Brodrick is a 64 y.o. male has a past medical history significant for DM and CKD now with severe depression. Recently treated on medicine for presumed UTI and dehydration. UA and urine culture negative. Pt is not eating or drinking due to major depression. States that he "hurts all over". Has a wound to his left heel from wearing shoes. Afebrile. WBC normal. No complaints of dysuria or hematuria.  Review of Systems: The patient denies  fever, weight loss,, vision loss, decreased hearing, hoarseness, syncope, dyspnea on exertion, peripheral edema, balance deficits, hemoptysis, melena, hematochezia, severe indigestion/heartburn, hematuria, incontinence, genital sores, muscle weakness, suspicious skin lesions, transient blindness, difficulty walking, depression, unusual weight change, abnormal bleeding, enlarged lymph nodes, angioedema, and breast masses.   Past Medical History  Diagnosis Date  . Mood swings   . Type II or unspecified type diabetes mellitus with unspecified complication, uncontrolled 06/02/2014  . Other and unspecified hyperlipidemia 06/02/2014  . OSA on CPAP 06/02/2014  . 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 Bipolar disorder   . CHF (congestive heart failure)   . CKD (chronic kidney disease), stage III   . Diabetes mellitus without complication   . Hypertension   . Gout   . Bipolar 1 disorder   . OSA on CPAP    History reviewed. No pertinent past surgical history. Social History:  reports that he has never smoked. He does not have any smokeless tobacco history on file. He reports that he does not drink alcohol or use illicit drugs.  No Known Allergies  Family History  Problem Relation Age of Onset  . Dementia Mother   .  Arthritis Father   . Heart failure Neg Hx   . Kidney failure Neg Hx   . Cancer Neg Hx     Prior to Admission medications   Medication Sig Start Date End Date Taking? Authorizing Provider  allopurinol (ZYLOPRIM) 300 MG tablet Take 1 tablet (300 mg total) by mouth daily. For gout 07/15/14  Yes Beau Fanny, FNP  amLODipine (NORVASC) 10 MG tablet Take 10 mg by mouth daily. 06/23/15  Yes Historical Provider, MD  aspirin EC 81 MG tablet Take 81 mg by mouth daily.   Yes Historical Provider, MD  atorvastatin (LIPITOR) 10 MG tablet Take 1 tablet (10 mg total) by mouth daily. For high cholesterol 06/05/14  Yes Sanjuana Kava, NP  cholecalciferol (VITAMIN D) 400 UNITS TABS tablet Take 800 Units by mouth daily.   Yes Historical Provider, MD  clonazePAM (KLONOPIN) 0.5 MG tablet Take 0.5 mg by mouth 2 (two) times daily.   Yes Historical Provider, MD  colchicine 0.6 MG tablet Take 1 tablet (0.6 mg total) by mouth 2 (two) times daily. For Gout pain Patient taking differently: Take 0.6 mg by mouth daily. For Gout pain 06/05/14  Yes Sanjuana Kava, NP  furosemide (LASIX) 20 MG tablet Take 20 mg by mouth daily. 06/09/15  Yes Historical Provider, MD  glipiZIDE (GLUCOTROL XL) 5 MG 24 hr tablet Take 5 mg by mouth daily. 06/23/15  Yes Historical Provider, MD  lamoTRIgine (LAMICTAL) 100 MG tablet Take 1 tablet (100 mg total) by mouth every evening. 07/15/14  Yes Beau Fanny, FNP  metoprolol (TOPROL-XL) 200 MG 24 hr tablet Take 1 tablet (200  mg total) by mouth daily. For high blood pressure 06/05/14  Yes Sanjuana Kava, NP  mirtazapine (REMERON) 30 MG tablet Take 30 mg by mouth at bedtime.   Yes Historical Provider, MD  QUEtiapine (SEROQUEL) 400 MG tablet Take 400 mg by mouth at bedtime.  06/23/15  Yes Historical Provider, MD  traZODone (DESYREL) 100 MG tablet Take 2 tablets (200 mg total) by mouth at bedtime. For sleep 06/05/14  Yes Sanjuana Kava, NP  hydrALAZINE (APRESOLINE) 50 MG tablet Take 1 tablet (50 mg total) by mouth  every 6 (six) hours. 07/15/14   Beau Fanny, FNP  lurasidone (LATUDA) 40 MG TABS tablet Take 40 mg by mouth every evening.    Historical Provider, MD   Physical Exam: Filed Vitals:   07/18/15 1001 07/18/15 2038 07/18/15 2044 07/19/15 0559  BP: 145/98 156/105 133/81 128/90  Pulse: 102 101 99 98  Temp: 98.2 F (36.8 C) 98.3 F (36.8 C)  98.6 F (37 C)  TempSrc: Axillary Axillary  Axillary  Resp: 18 20  18   SpO2: 96% 95%  95%     General:  No apparent distress  Eyes: PERRL, EOMI, no scleral icterus  ENT: moist oropharynx  Neck: supple, no lymphadenopathy  Cardiovascular: regular rate without MRG; 2+ peripheral pulses, no JVD, no peripheral edema  Respiratory: CTA biL, good air movement without wheezing, rhonchi or crackled  Abdomen: soft, non tender to palpation, positive bowel sounds, no guarding, no rebound  Skin: no rashes. Large blood filled blister to left heel  Musculoskeletal: normal bulk and tone, no joint swelling  Psychiatric: normal mood and affect  Neurologic: CN 2-12 grossly intact, MS 5/5 in all 4  Labs on Admission:  Basic Metabolic Panel:  Recent Labs Lab 07/13/15 0638 07/14/15 0540 07/15/15 0431 07/19/15 1221  NA 138 136 137 133*  K 3.7 3.8 4.3 4.8  CL 104 97* 102 97*  CO2 24 24 26 24   GLUCOSE 122* 232* 196* 268*  BUN 16 25* 25* 29*  CREATININE 1.15 1.74* 1.28* 1.61*  CALCIUM 8.4* 8.8* 8.5* 9.0   Liver Function Tests: No results for input(s): AST, ALT, ALKPHOS, BILITOT, PROT, ALBUMIN in the last 168 hours. No results for input(s): LIPASE, AMYLASE in the last 168 hours. No results for input(s): AMMONIA in the last 168 hours. CBC:  Recent Labs Lab 07/13/15 0638 07/14/15 0540 07/15/15 0431 07/19/15 1221  WBC 2.8* 5.2 4.7 9.6  HGB 13.4 15.1 13.4 15.5  HCT 40.0 45.4 39.5* 47.0  MCV 89.8 91.3 88.4 90.4  PLT 150 206 179 233   Cardiac Enzymes: No results for input(s): CKTOTAL, CKMB, CKMBINDEX, TROPONINI in the last 168 hours.  BNP  (last 3 results) No results for input(s): BNP in the last 8760 hours.  ProBNP (last 3 results) No results for input(s): PROBNP in the last 8760 hours.  CBG:  Recent Labs Lab 07/15/15 1639 07/16/15 0752 07/18/15 0613 07/18/15 2035 07/19/15 0612  GLUCAP 237* 239* 122* 180* 141*    Radiological Exams on Admission: No results found.  EKG: Independently reviewed.  Assessment/Plan Principal Problem:   Bipolar disorder, current episode depressed, severe, with psychotic features Active Problems:   Hypertension   UTI (urinary tract infection)   Diabetes   Pt was encouraged to increase po intake. Follow labs closely as pt may require IV fluids if things do not improve. No indiction for ABX at this time as there is no evidence of UTI. Repeat UA when able. Will consult Podiatry for  his foot wound. Thank you for the consultation. Call if questions arise.  Diet: carb modified Fluids: none DVT Prophylaxis: per Psych  Code Status: FULL  Family Communication: none  Disposition Plan: per Psych  Time spent: 45 min

## 2015-07-19 NOTE — Plan of Care (Signed)
Problem: Ineffective individual coping Goal: STG: Pt will be able to identify effective and ineffective STG: Pt will be able to identify effective and ineffective coping patterns  Outcome: Progressing Patient is responding some when engaged in conversation, he has some coping mechanisms he uses an d he is able to voice his needs.

## 2015-07-19 NOTE — Progress Notes (Signed)
Patient is alert, responsive to name and aware that he is in the hospital. His mood is depressed with congruent affect. Would not answer questions regarding suicidal thoughts. No evidence of gross psychotic thinking, patient denies hallucinations. Speech is very soft, somewhat garbled with poverty of content. Thoughts somewhat disorganized. Appetite remains poor but patient drinking supplement and taking adequate fluids.  Patient needing assistance with all ADLs, can stand and turn with two person assist. He has been up out of bed to wheelchair. He was encouraged to alert staff for toileting. Patient compliant with all nursing interventions. He took all his medications with prompting. Dressing care to blister applied per order. Blister remains intact approx 5 cm in circumference.  Will continue current treatment plan as written. Patient remains on 1:1 with sitter for safety.

## 2015-07-19 NOTE — [facility] Group Notes (Signed)
[facility] LCSW Group Therapy  07/19/2015 3:57 PM  Type of Therapy:  Group Therapy  Participation Level:  Did Not Attend  Modes of Intervention:  Discussion, Education, Socialization and Support  Summary of Progress/Problems:Pt will identify unhealthy thoughts and how they impact their emotions and behavior. Pt will be encouraged to discuss these thoughts, emotions and behaviors with the group.   Zamariyah Furukawa L Torie Towle MSW, LCSWA  07/19/2015, 3:57 PM 

## 2015-07-19 NOTE — Progress Notes (Signed)
Diontae has a Comptroller at the bedside for safety. He was cooperative, ate a snack, took his medicines and was med compliant. He remained in bed resting most of shift, dressing was changed x2 on left ankle area pressure ulcer. He was changed due to incontinence of urine x 2.

## 2015-07-19 NOTE — Progress Notes (Signed)
[facility] MD Progress Note  07/19/2015 12:59 PM Christian [name]  MRN:  [id number]  Subjective:  Christian [name] is very withdrawn today. Made no eye contact. Described himself as very depressed. Not eating well. Does say that he's having pain and weakness. Doesn't talk about suicidal ideation. Patient remains extremely impaired.  Principal Problem: Bipolar disorder, current episode depressed, severe, with psychotic features Diagnosis:   Patient Active Problem List   Diagnosis Date Noted  . Bipolar disorder, current episode depressed, severe, with psychotic features [F31.5] 07/09/2015  . Diabetes [E11.9] 07/09/2015  . UTI (urinary tract infection) [N39.0] 06/19/2014  . Positive blood culture [R78.81] 06/19/2014  . Bipolar affective disorder, current episode manic with psychotic symptoms [F31.2] 06/17/2014  . Type II or unspecified type diabetes mellitus with unspecified complication, uncontrolled [E11.8] 06/02/2014  . Other and unspecified hyperlipidemia [E78.5] 06/02/2014  . OSA on CPAP [G47.33] 06/02/2014  . 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 Gout [M10.9] 06/02/2014  . Hypertension [I10]    Total Time spent with patient: 20 minutes   Past Medical History:  Past Medical History  Diagnosis Date  . Mood swings   . Type II or unspecified type diabetes mellitus with unspecified complication, uncontrolled 06/02/2014  . Other and unspecified hyperlipidemia 06/02/2014  . OSA on CPAP 06/02/2014  . 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 Bipolar disorder   . CHF (congestive heart failure)   . CKD (chronic kidney disease), stage III   . Diabetes mellitus without complication   . Hypertension   . Gout   . Bipolar 1 disorder   . OSA on CPAP    History reviewed. No pertinent past surgical history. Family History:  Family History  Problem Relation Age of Onset  . Dementia Mother   . Arthritis Father   .  Heart failure Neg Hx   . Kidney failure Neg Hx   . Cancer Neg Hx    Social History:  History  Alcohol Use No     History  Drug Use No    Social History   Social History  . Marital Status: Married    Spouse Name: N/A  . Number of Children: N/A  . Years of Education: N/A   Social History Main Topics  . Smoking status: Never Smoker   . Smokeless tobacco: None  . Alcohol Use: No  . Drug Use: No  . Sexual Activity: Not Currently   Other Topics Concern  . None   Social History Narrative   ** Merged History Encounter **       Additional History:    Sleep: Poor  Appetite:  Poor   Assessment:   Musculoskeletal: Strength & Muscle Tone: within normal limits Gait & Station: normal Patient leans: N/A   Psychiatric Specialty Exam: Physical Exam  Nursing note and vitals reviewed. Constitutional: He appears well-developed. He appears lethargic.  HENT:  Head: Normocephalic and atraumatic.  Eyes: Conjunctivae are normal. Pupils are equal, round, and reactive to light.  Neck: Normal range of motion.  Cardiovascular: Normal heart sounds.   Respiratory: Effort normal.  GI: Soft.  Musculoskeletal: Normal range of motion.       Legs: Neurological: He appears lethargic.  Skin: Skin is warm and dry.     Psychiatric: His speech is delayed. He is slowed and withdrawn. Cognition and memory are impaired. He expresses impulsivity. He exhibits a depressed mood. He exhibits abnormal recent memory and abnormal remote  memory.    Review of Systems  Constitutional: Positive for weight loss and malaise/fatigue.  Skin:       Blister of left foot.  Neurological: Positive for focal weakness.  Psychiatric/Behavioral: Positive for depression and memory loss. Negative for suicidal ideas and substance abuse. The patient is nervous/anxious and has insomnia.   All other systems reviewed and are negative.   Blood pressure 128/90, pulse 98, temperature 98.6 F (37 C), temperature source  Axillary, resp. rate 18, SpO2 95 %.There is no weight on file to calculate BMI.  General Appearance: Casual  Eye Contact::  Minimal  Speech:  Slow  Volume:  Decreased  Mood:  Depressed and Hopeless  Affect:  Flat  Thought Process:  Coherent  Orientation:  Full (Time, Place, and Person)  Thought Content:  Delusions, Hallucinations: Auditory Visual and Paranoid Ideation  Suicidal Thoughts:  No  Homicidal Thoughts:  No  Memory:  Immediate;   Poor Recent;   Poor Remote;   Poor  Judgement:  Poor  Insight:  Lacking  Psychomotor Activity:  Decreased  Concentration:  Poor  Recall:  Poor  Fund of Knowledge:Fair  Language: Fair  Akathisia:  No  Handed:  Right  AIMS (if indicated):     Assets:  Communication Skills  ADL's:  Intact  Cognition: WNL  Sleep:  Number of Hours: 8     Current Medications: Current Facility-Administered Medications  Medication Dose Route Frequency Provider Last Rate Last Dose  . acetaminophen (TYLENOL) tablet 650 mg  650 mg Oral Q6H PRN Clovis Fredrickson, MD   650 mg at 07/18/15 1934  . allopurinol (ZYLOPRIM) tablet 300 mg  300 mg Oral Daily Jolanta B Pucilowska, MD   300 mg at 07/19/15 0854  . alum & mag hydroxide-simeth (MAALOX/MYLANTA) 200-200-20 MG/5ML suspension 30 mL  30 mL Oral Q4H PRN Jolanta B Pucilowska, MD      . amLODipine (NORVASC) tablet 10 mg  10 mg Oral Daily Jolanta B Pucilowska, MD   10 mg at 07/19/15 0857  . aspirin chewable tablet 81 mg  81 mg Oral Daily Jolanta B Pucilowska, MD   81 mg at 07/19/15 0853  . atorvastatin (LIPITOR) tablet 10 mg  10 mg Oral q1800 Jolanta B Pucilowska, MD   10 mg at 07/18/15 1727  . cholecalciferol (VITAMIN D) tablet 400 Units  400 Units Oral Daily Clovis Fredrickson, MD   400 Units at 07/19/15 0855  . colchicine tablet 0.6 mg  0.6 mg Oral BID Clovis Fredrickson, MD   0.6 mg at 07/19/15 0855  . diphenhydrAMINE (BENADRYL) injection 50 mg  50 mg Intramuscular Q6H PRN Clovis Fredrickson, MD   50 mg at  07/16/15 1951  . feeding supplement (GLUCERNA SHAKE) (GLUCERNA SHAKE) liquid 237 mL  237 mL Oral TID WC Jolanta B Pucilowska, MD   237 mL at 07/19/15 1205  . furosemide (LASIX) tablet 20 mg  20 mg Oral Daily Jolanta B Pucilowska, MD   20 mg at 07/19/15 0855  . glipiZIDE (GLUCOTROL XL) 24 hr tablet 5 mg  5 mg Oral Q breakfast Jolanta B Pucilowska, MD   5 mg at 07/19/15 0830  . haloperidol lactate (HALDOL) injection 10 mg  10 mg Intramuscular Q6H PRN Jolanta B Pucilowska, MD   10 mg at 07/16/15 1951  . hydrALAZINE (APRESOLINE) tablet 50 mg  50 mg Oral 4 times per day Clovis Fredrickson, MD   50 mg at 07/19/15 1205  . LORazepam (ATIVAN) injection 2 mg  2 mg Intramuscular Q2H PRN Gonzella Lex, MD   2 mg at 07/16/15 1950  . magnesium hydroxide (MILK OF MAGNESIA) suspension 30 mL  30 mL Oral Daily PRN Jolanta B Pucilowska, MD      . metoprolol succinate (TOPROL-XL) 24 hr tablet 200 mg  200 mg Oral Daily Jolanta B Pucilowska, MD   200 mg at 07/19/15 0854  . mirtazapine (REMERON) tablet 30 mg  30 mg Oral QHS Clovis Fredrickson, MD   30 mg at 07/18/15 2142  . QUEtiapine (SEROQUEL) tablet 400 mg  400 mg Oral QHS Clovis Fredrickson, MD   400 mg at 07/18/15 2141  . traZODone (DESYREL) tablet 25 mg  25 mg Oral TID Clovis Fredrickson, MD   25 mg at 07/19/15 0855  . tuberculin injection 5 Units  5 Units Intradermal Once Clovis Fredrickson, MD   5 Units at 07/17/15 1502    Lab Results:  Results for orders placed or performed during the hospital encounter of 07/16/15 (from the past 48 hour(s))  Vitamin B12     Status: Abnormal   Collection Time: 07/17/15  1:40 PM  Result Value Ref Range   Vitamin B-12 1877 (H) 180 - 914 pg/mL    Comment: (NOTE) This assay is not validated for testing neonatal or myeloproliferative syndrome specimens for Vitamin B12 levels. Performed at [facility]   Folate     Status: None   Collection Time: 07/17/15  1:40 PM  Result Value Ref Range   Folate 20.3 >5.9  ng/mL  RPR     Status: None   Collection Time: 07/17/15  1:40 PM  Result Value Ref Range   RPR Ser Ql Non Reactive Non Reactive    Comment: (NOTE) Performed At: [facility] [place], [place] [id number] Lindon Romp MD JI:[id number]   Glucose, capillary     Status: Abnormal   Collection Time: 07/18/15  6:13 AM  Result Value Ref Range   Glucose-Capillary 122 (H) 65 - 99 mg/dL  Glucose, capillary     Status: Abnormal   Collection Time: 07/18/15  8:35 PM  Result Value Ref Range   Glucose-Capillary 180 (H) 65 - 99 mg/dL  Glucose, capillary     Status: Abnormal   Collection Time: 07/19/15  6:12 AM  Result Value Ref Range   Glucose-Capillary 141 (H) 65 - 99 mg/dL  CBC     Status: None   Collection Time: 07/19/15 12:21 PM  Result Value Ref Range   WBC 9.6 3.8 - 10.6 K/uL   RBC 5.20 4.40 - 5.90 MIL/uL   Hemoglobin 15.5 13.0 - 18.0 g/dL   HCT 47.0 40.0 - 52.0 %   MCV 90.4 80.0 - 100.0 fL   MCH 29.8 26.0 - 34.0 pg   MCHC 33.0 32.0 - 36.0 g/dL   RDW 12.7 11.5 - 14.5 %   Platelets 233 150 - 440 K/uL  Basic metabolic panel     Status: Abnormal   Collection Time: 07/19/15 12:21 PM  Result Value Ref Range   Sodium 133 (L) 135 - 145 mmol/L   Potassium 4.8 3.5 - 5.1 mmol/L   Chloride 97 (L) 101 - 111 mmol/L   CO2 24 22 - 32 mmol/L   Glucose, Bld 268 (H) 65 - 99 mg/dL   BUN 29 (H) 6 - 20 mg/dL   Creatinine, Ser 1.61 (H) 0.61 - 1.24 mg/dL   Calcium 9.0 8.9 - 10.3 mg/dL   GFR  calc non Af Amer 45 (L) >60 mL/min   GFR calc Af Amer 52 (L) >60 mL/min    Comment: (NOTE) The eGFR has been calculated using the CKD EPI equation. This calculation has not been validated in all clinical situations. eGFR's persistently <60 mL/min signify possible Chronic Kidney Disease.    Anion gap 12 5 - 15    Physical Findings: AIMS: Facial and Oral Movements Muscles of Facial Expression: None, normal Lips and Perioral Area: None, normal Jaw: None, normal Tongue: None,  normal,Extremity Movements Upper (arms, wrists, hands, fingers): None, normal Lower (legs, knees, ankles, toes): None, normal, Trunk Movements Neck, shoulders, hips: None, normal, Overall Severity Severity of abnormal movements (highest score from questions above): None, normal Incapacitation due to abnormal movements: None, normal Patient's awareness of abnormal movements (rate only patient's report): No Awareness, Dental Status Current problems with teeth and/or dentures?: No Does patient usually wear dentures?: No  CIWA:    COWS:     Treatment Plan Summary: Daily contact with patient to assess and evaluate symptoms and progress in treatment and Medication management   Medical Decision Making:  Established Problem, Stable/Improving (1), Review of Psycho-Social Stressors (1), Review or order clinical lab tests (1), Review of Medication Regimen & Side Effects (2) and Review of New Medication or Change in Dosage (2)   Christian [name] is an 64 year old male with a history of bipolar illness admitted for worsening of depression, psychosis, and agitated behavior.   1. Agitation. The patient became agitated on the medical floor informed that he would be trusted to psychiatry. He requires a one-to-one Actuary. He was given a combination of Haldol, Ativan, and Benadryl IM with much improvement. When necessary medications available.We stared Trazodone for aggressive behavior.  2. Mood and psychosis. The patient has been maintained on Seroquel 400 mg nightly for mood stabilization and Remeron for depression. Zyprexa and Haldol were used to address agitation on the medical floor. We will try switching to Haldol. He used to take Lamictal but this was discontinued on the medical floor. The patient may be a candidate for therefore I will not restart Lamictal.  Patient with bipolar disorder with psychotic depression. Began discussion of ECT today. We have talked about it before. Patient has had ECT in the past  with good response. He was open to the discussion but very slow in his thinking. We will continue to review this tomorrow.  3. ECT. Will contact his wife. I will ask Dr. Weber Cooks for a consultation.  4. Hypertension we will continue her Dilantin, metoprolol, Norvasc. Along with aspirin.  5. Dyslipidemia. He is on Lipitor.  6. Diabetes. He is on glipizide with Accu-Cheks.  7. Gout. We continue allopurinol and colchicine.  8. Edema. We will continue furosemide.   9. UTI treated on medical floor hopefully resolved.   10. Diabetic foot. Wound care consult completed.   11. Disposition. To be established. He will most likely will return to home with his wife. Placement is not out of the question. Medicine is to see him today. Multiple medical problems including foot ulcer. No change to medication today will follow-up as to medicine consult.    [name] 07/19/2015, 12:59 PM

## 2015-07-20 LAB — CBC WITH DIFFERENTIAL/PLATELET
Basophils Absolute: 0 10*3/uL (ref 0–0.1)
Basophils Relative: 0 %
Eosinophils Absolute: 0 10*3/uL (ref 0–0.7)
Eosinophils Relative: 0 %
HCT: 47.9 % (ref 40.0–52.0)
Hemoglobin: 16 g/dL (ref 13.0–18.0)
Lymphocytes Relative: 10 %
Lymphs Abs: 1.1 10*3/uL (ref 1.0–3.6)
MCH: 30.1 pg (ref 26.0–34.0)
MCHC: 33.5 g/dL (ref 32.0–36.0)
MCV: 89.9 fL (ref 80.0–100.0)
Monocytes Absolute: 1.2 10*3/uL — ABNORMAL HIGH (ref 0.2–1.0)
Monocytes Relative: 11 %
Neutro Abs: 8.8 10*3/uL — ABNORMAL HIGH (ref 1.4–6.5)
Neutrophils Relative %: 79 %
Platelets: 276 10*3/uL (ref 150–440)
RBC: 5.33 MIL/uL (ref 4.40–5.90)
RDW: 12.7 % (ref 11.5–14.5)
WBC: 11.1 10*3/uL — ABNORMAL HIGH (ref 3.8–10.6)

## 2015-07-20 LAB — BASIC METABOLIC PANEL
Anion gap: 14 (ref 5–15)
BUN: 27 mg/dL — ABNORMAL HIGH (ref 6–20)
CO2: 27 mmol/L (ref 22–32)
Calcium: 9.3 mg/dL (ref 8.9–10.3)
Chloride: 99 mmol/L — ABNORMAL LOW (ref 101–111)
Creatinine, Ser: 1.56 mg/dL — ABNORMAL HIGH (ref 0.61–1.24)
GFR calc Af Amer: 54 mL/min — ABNORMAL LOW (ref >60)
GFR calc non Af Amer: 46 mL/min — ABNORMAL LOW (ref >60)
Glucose, Bld: 182 mg/dL — ABNORMAL HIGH (ref 65–99)
Potassium: 4.4 mmol/L (ref 3.5–5.1)
Sodium: 140 mmol/L (ref 135–145)

## 2015-07-20 LAB — URINALYSIS COMPLETE WITH MICROSCOPIC (ARMC ONLY)
Bacteria, UA: NONE SEEN
Bilirubin Urine: NEGATIVE
Glucose, UA: 150 mg/dL — AB
Hgb urine dipstick: NEGATIVE
Leukocytes, UA: NEGATIVE
Nitrite: NEGATIVE
Protein, ur: 100 mg/dL — AB
Specific Gravity, Urine: 1.018 (ref 1.005–1.030)
pH: 5 (ref 5.0–8.0)

## 2015-07-20 MED ORDER — INSULIN ASPART 100 UNIT/ML ~~LOC~~ SOLN
0.0000 [IU] | Freq: Every day | SUBCUTANEOUS | Status: DC
Start: 2015-07-20 — End: 2015-07-29
  Administered 2015-07-22: 2 [IU] via SUBCUTANEOUS
  Administered 2015-07-23 – 2015-07-24 (×2): 3 [IU] via SUBCUTANEOUS
  Filled 2015-07-20 (×2): qty 3
  Filled 2015-07-20: qty 2

## 2015-07-20 MED ORDER — INSULIN ASPART 100 UNIT/ML ~~LOC~~ SOLN
0.0000 [IU] | Freq: Three times a day (TID) | SUBCUTANEOUS | Status: DC
  Administered 2015-07-20: 3 [IU] via SUBCUTANEOUS
  Administered 2015-07-21: 2 [IU] via SUBCUTANEOUS
  Administered 2015-07-21 (×2): 1 [IU] via SUBCUTANEOUS
  Administered 2015-07-22 (×3): 2 [IU] via SUBCUTANEOUS
  Administered 2015-07-23 – 2015-07-25 (×5): 3 [IU] via SUBCUTANEOUS
  Administered 2015-07-26: 2 [IU] via SUBCUTANEOUS
  Administered 2015-07-26 (×2): 3 [IU] via SUBCUTANEOUS
  Administered 2015-07-27: 1 [IU] via SUBCUTANEOUS
  Administered 2015-07-28: 2 [IU] via SUBCUTANEOUS
  Administered 2015-07-28: 1 [IU] via SUBCUTANEOUS
  Filled 2015-07-20: qty 3
  Filled 2015-07-20: qty 2
  Filled 2015-07-20: qty 1
  Filled 2015-07-20: qty 3
  Filled 2015-07-20: qty 1
  Filled 2015-07-20 (×2): qty 2
  Filled 2015-07-20: qty 1
  Filled 2015-07-20: qty 3
  Filled 2015-07-20: qty 2
  Filled 2015-07-20: qty 3

## 2015-07-20 NOTE — Progress Notes (Signed)
[facility] MD Progress Note  07/20/2015 2:53 PM Christian [name]  MRN:  [id number]  Subjective:  After a night of being very withdrawn he seems slightly more involved today. He was able to make better eye contact and have a conversation although he still seems confused. He did eat and drink today and has had some bathroom activity which is a big relief. His creatinine is up a little bit.  Principal Problem: Bipolar disorder, current episode depressed, severe, with psychotic features Diagnosis:   Patient Active Problem List   Diagnosis Date Noted  . Bipolar disorder, current episode depressed, severe, with psychotic features [F31.5] 07/09/2015  . Diabetes [E11.9] 07/09/2015  . UTI (urinary tract infection) [N39.0] 06/19/2014  . Positive blood culture [R78.81] 06/19/2014  . Bipolar affective disorder, current episode manic with psychotic symptoms [F31.2] 06/17/2014  . Type II or unspecified type diabetes mellitus with unspecified complication, uncontrolled [E11.8] 06/02/2014  . Other and unspecified hyperlipidemia [E78.5] 06/02/2014  . OSA on CPAP [G47.33] 06/02/2014  . 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 Gout [M10.9] 06/02/2014  . Hypertension [I10]    Total Time spent with patient: 20 minutes   Past Medical History:  Past Medical History  Diagnosis Date  . Mood swings   . Type II or unspecified type diabetes mellitus with unspecified complication, uncontrolled 06/02/2014  . Other and unspecified hyperlipidemia 06/02/2014  . OSA on CPAP 06/02/2014  . 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 Bipolar disorder   . CHF (congestive heart failure)   . CKD (chronic kidney disease), stage III   . Diabetes mellitus without complication   . Hypertension   . Gout   . Bipolar 1 disorder   . OSA on CPAP    History reviewed. No pertinent past surgical history. Family History:  Family History  Problem Relation  Age of Onset  . Dementia Mother   . Arthritis Father   . Heart failure Neg Hx   . Kidney failure Neg Hx   . Cancer Neg Hx    Social History:  History  Alcohol Use No     History  Drug Use No    Social History   Social History  . Marital Status: Married    Spouse Name: N/A  . Number of Children: N/A  . Years of Education: N/A   Social History Main Topics  . Smoking status: Never Smoker   . Smokeless tobacco: None  . Alcohol Use: No  . Drug Use: No  . Sexual Activity: Not Currently   Other Topics Concern  . None   Social History Narrative   ** Merged History Encounter **       Additional History:    Sleep: Poor  Appetite:  Poor   Assessment:   Musculoskeletal: Strength & Muscle Tone: within normal limits Gait & Station: normal Patient leans: N/A   Psychiatric Specialty Exam: Physical Exam  Nursing note and vitals reviewed. Constitutional: He appears well-developed. He appears lethargic.  HENT:  Head: Normocephalic and atraumatic.  Eyes: Conjunctivae are normal. Pupils are equal, round, and reactive to light.  Neck: Normal range of motion.  Cardiovascular: Normal heart sounds.   Respiratory: Effort normal.  GI: Soft.  Musculoskeletal: Normal range of motion.       Legs: Neurological: He appears lethargic.  Skin: Skin is warm and dry.     Psychiatric: His speech is delayed. He is slowed and withdrawn. Cognition and  memory are impaired. He expresses impulsivity. He exhibits a depressed mood. He exhibits abnormal recent memory and abnormal remote memory.    Review of Systems  Constitutional: Positive for weight loss and malaise/fatigue.  Skin:       Blister of left foot.  Neurological: Positive for focal weakness.  Psychiatric/Behavioral: Positive for depression and memory loss. Negative for suicidal ideas and substance abuse. The patient is nervous/anxious and has insomnia.   All other systems reviewed and are negative.   Blood pressure 122/80,  pulse 108, temperature 98.3 F (36.8 C), temperature source Oral, resp. rate 18, SpO2 95 %.There is no weight on file to calculate BMI.  General Appearance: Casual  Eye Contact::  Minimal  Speech:  Slow  Volume:  Decreased  Mood:  Depressed and Hopeless  Affect:  Flat  Thought Process:  Coherent  Orientation:  Full (Time, Place, and Person)  Thought Content:  Delusions, Hallucinations: Auditory Visual and Paranoid Ideation  Suicidal Thoughts:  No  Homicidal Thoughts:  No  Memory:  Immediate;   Poor Recent;   Poor Remote;   Poor  Judgement:  Poor  Insight:  Lacking  Psychomotor Activity:  Decreased  Concentration:  Poor  Recall:  Poor  Fund of Knowledge:Fair  Language: Fair  Akathisia:  No  Handed:  Right  AIMS (if indicated):     Assets:  Communication Skills  ADL's:  Intact  Cognition: WNL  Sleep:  Number of Hours: 6     Current Medications: Current Facility-Administered Medications  Medication Dose Route Frequency Provider Last Rate Last Dose  . acetaminophen (TYLENOL) tablet 650 mg  650 mg Oral Q6H PRN Clovis Fredrickson, MD   650 mg at 07/18/15 1934  . allopurinol (ZYLOPRIM) tablet 300 mg  300 mg Oral Daily Clovis Fredrickson, MD   300 mg at 07/20/15 0758  . alum & mag hydroxide-simeth (MAALOX/MYLANTA) 200-200-20 MG/5ML suspension 30 mL  30 mL Oral Q4H PRN Jolanta B Pucilowska, MD      . amLODipine (NORVASC) tablet 10 mg  10 mg Oral Daily Jolanta B Pucilowska, MD   10 mg at 07/20/15 0759  . aspirin chewable tablet 81 mg  81 mg Oral Daily Jolanta B Pucilowska, MD   81 mg at 07/20/15 0800  . atorvastatin (LIPITOR) tablet 10 mg  10 mg Oral q1800 Jolanta B Pucilowska, MD   10 mg at 07/19/15 1752  . cholecalciferol (VITAMIN D) tablet 400 Units  400 Units Oral Daily Clovis Fredrickson, MD   400 Units at 07/20/15 0800  . colchicine tablet 0.6 mg  0.6 mg Oral BID Jolanta B Pucilowska, MD   0.6 mg at 07/20/15 0800  . diphenhydrAMINE (BENADRYL) injection 50 mg  50 mg  Intramuscular Q6H PRN Clovis Fredrickson, MD   50 mg at 07/16/15 1951  . feeding supplement (GLUCERNA SHAKE) (GLUCERNA SHAKE) liquid 237 mL  237 mL Oral TID WC Jolanta B Pucilowska, MD   237 mL at 07/20/15 1131  . haloperidol lactate (HALDOL) injection 10 mg  10 mg Intramuscular Q6H PRN Jolanta B Pucilowska, MD   10 mg at 07/16/15 1951  . hydrALAZINE (APRESOLINE) tablet 50 mg  50 mg Oral 4 times per day Clovis Fredrickson, MD   50 mg at 07/20/15 1137  . insulin aspart (novoLOG) injection 0-5 Units  0-5 Units Subcutaneous QHS Loletha Grayer, MD      . insulin aspart (novoLOG) injection 0-9 Units  0-9 Units Subcutaneous TID WC Loletha Grayer, MD      .  LORazepam (ATIVAN) injection 2 mg  2 mg Intramuscular Q2H PRN Gonzella Lex, MD   2 mg at 07/20/15 0941  . magnesium hydroxide (MILK OF MAGNESIA) suspension 30 mL  30 mL Oral Daily PRN Jolanta B Pucilowska, MD      . metoprolol succinate (TOPROL-XL) 24 hr tablet 200 mg  200 mg Oral Daily Jolanta B Pucilowska, MD   200 mg at 07/20/15 0800  . mirtazapine (REMERON) tablet 30 mg  30 mg Oral QHS Clovis Fredrickson, MD   30 mg at 07/18/15 2142  . QUEtiapine (SEROQUEL) tablet 400 mg  400 mg Oral QHS Clovis Fredrickson, MD   400 mg at 07/18/15 2141  . traZODone (DESYREL) tablet 25 mg  25 mg Oral TID Clovis Fredrickson, MD   25 mg at 07/20/15 0759    Lab Results:  Results for orders placed or performed during the hospital encounter of 07/16/15 (from the past 48 hour(s))  Glucose, capillary     Status: Abnormal   Collection Time: 07/18/15  8:35 PM  Result Value Ref Range   Glucose-Capillary 180 (H) 65 - 99 mg/dL  Glucose, capillary     Status: Abnormal   Collection Time: 07/19/15  6:12 AM  Result Value Ref Range   Glucose-Capillary 141 (H) 65 - 99 mg/dL  CBC     Status: None   Collection Time: 07/19/15 12:21 PM  Result Value Ref Range   WBC 9.6 3.8 - 10.6 K/uL   RBC 5.20 4.40 - 5.90 MIL/uL   Hemoglobin 15.5 13.0 - 18.0 g/dL   HCT 47.0  40.0 - 52.0 %   MCV 90.4 80.0 - 100.0 fL   MCH 29.8 26.0 - 34.0 pg   MCHC 33.0 32.0 - 36.0 g/dL   RDW 12.7 11.5 - 14.5 %   Platelets 233 150 - 440 K/uL  Basic metabolic panel     Status: Abnormal   Collection Time: 07/19/15 12:21 PM  Result Value Ref Range   Sodium 133 (L) 135 - 145 mmol/L   Potassium 4.8 3.5 - 5.1 mmol/L   Chloride 97 (L) 101 - 111 mmol/L   CO2 24 22 - 32 mmol/L   Glucose, Bld 268 (H) 65 - 99 mg/dL   BUN 29 (H) 6 - 20 mg/dL   Creatinine, Ser 1.61 (H) 0.61 - 1.24 mg/dL   Calcium 9.0 8.9 - 10.3 mg/dL   GFR calc non Af Amer 45 (L) >60 mL/min   GFR calc Af Amer 52 (L) >60 mL/min    Comment: (NOTE) The eGFR has been calculated using the CKD EPI equation. This calculation has not been validated in all clinical situations. eGFR's persistently <60 mL/min signify possible Chronic Kidney Disease.    Anion gap 12 5 - 15  CBC with Differential/Platelet     Status: Abnormal   Collection Time: 07/20/15  7:33 AM  Result Value Ref Range   WBC 11.1 (H) 3.8 - 10.6 K/uL   RBC 5.33 4.40 - 5.90 MIL/uL   Hemoglobin 16.0 13.0 - 18.0 g/dL   HCT 47.9 40.0 - 52.0 %   MCV 89.9 80.0 - 100.0 fL   MCH 30.1 26.0 - 34.0 pg   MCHC 33.5 32.0 - 36.0 g/dL   RDW 12.7 11.5 - 14.5 %   Platelets 276 150 - 440 K/uL   Neutrophils Relative % 79 %   Neutro Abs 8.8 (H) 1.4 - 6.5 K/uL   Lymphocytes Relative 10 %   Lymphs Abs 1.1  1.0 - 3.6 K/uL   Monocytes Relative 11 %   Monocytes Absolute 1.2 (H) 0.2 - 1.0 K/uL   Eosinophils Relative 0 %   Eosinophils Absolute 0.0 0 - 0.7 K/uL   Basophils Relative 0 %   Basophils Absolute 0.0 0 - 0.1 K/uL  Basic metabolic panel     Status: Abnormal   Collection Time: 07/20/15  7:33 AM  Result Value Ref Range   Sodium 140 135 - 145 mmol/L   Potassium 4.4 3.5 - 5.1 mmol/L   Chloride 99 (L) 101 - 111 mmol/L   CO2 27 22 - 32 mmol/L   Glucose, Bld 182 (H) 65 - 99 mg/dL   BUN 27 (H) 6 - 20 mg/dL   Creatinine, Ser 1.56 (H) 0.61 - 1.24 mg/dL   Calcium 9.3 8.9  - 10.3 mg/dL   GFR calc non Af Amer 46 (L) >60 mL/min   GFR calc Af Amer 54 (L) >60 mL/min    Comment: (NOTE) The eGFR has been calculated using the CKD EPI equation. This calculation has not been validated in all clinical situations. eGFR's persistently <60 mL/min signify possible Chronic Kidney Disease.    Anion gap 14 5 - 15  Urinalysis complete, with microscopic ([facility] only)     Status: Abnormal   Collection Time: 07/20/15 11:46 AM  Result Value Ref Range   Color, Urine YELLOW (A) YELLOW   APPearance CLEAR (A) CLEAR   Glucose, UA 150 (A) NEGATIVE mg/dL   Bilirubin Urine NEGATIVE NEGATIVE   Ketones, ur TRACE (A) NEGATIVE mg/dL   Specific Gravity, Urine 1.018 1.005 - 1.030   Hgb urine dipstick NEGATIVE NEGATIVE   pH 5.0 5.0 - 8.0   Protein, ur 100 (A) NEGATIVE mg/dL   Nitrite NEGATIVE NEGATIVE   Leukocytes, UA NEGATIVE NEGATIVE   RBC / HPF 0-5 0 - 5 RBC/hpf   WBC, UA 0-5 0 - 5 WBC/hpf   Bacteria, UA NONE SEEN NONE SEEN   Squamous Epithelial / LPF 0-5 (A) NONE SEEN   Mucous PRESENT     Physical Findings: AIMS: Facial and Oral Movements Muscles of Facial Expression: None, normal Lips and Perioral Area: None, normal Jaw: None, normal Tongue: None, normal,Extremity Movements Upper (arms, wrists, hands, fingers): None, normal Lower (legs, knees, ankles, toes): None, normal, Trunk Movements Neck, shoulders, hips: None, normal, Overall Severity Severity of abnormal movements (highest score from questions above): None, normal Incapacitation due to abnormal movements: None, normal Patient's awareness of abnormal movements (rate only patient's report): No Awareness, Dental Status Current problems with teeth and/or dentures?: No Does patient usually wear dentures?: No  CIWA:    COWS:     Treatment Plan Summary: Daily contact with patient to assess and evaluate symptoms and progress in treatment and Medication management   Medical Decision Making:  Established Problem,  Stable/Improving (1), Review of Psycho-Social Stressors (1), Review or order clinical lab tests (1), Review of Medication Regimen & Side Effects (2) and Review of New Medication or Change in Dosage (2)   Mr. Surgeon is an 64 year old male with a history of bipolar illness admitted for worsening of depression, psychosis, and agitated behavior.   1. Agitation. The patient became agitated on the medical floor informed that he would be trusted to psychiatry. He requires a one-to-one Actuary. He was given a combination of Haldol, Ativan, and Benadryl IM with much improvement. When necessary medications available.We stared Trazodone for aggressive behavior.  2. Mood and psychosis. The patient has been maintained on  Seroquel 400 mg nightly for mood stabilization and Remeron for depression. Zyprexa and Haldol were used to address agitation on the medical floor. We will try switching to Haldol. He used to take Lamictal but this was discontinued on the medical floor. The patient may be a candidate for therefore I will not restart Lamictal.  Patient with bipolar disorder with psychotic depression. Began discussion of ECT today. We have talked about it before. Patient has had ECT in the past with good response. He was open to the discussion but very slow in his thinking. We will continue to review this tomorrow.  3. ECT. Will contact his wife. I will ask Dr. Weber Cooks for a consultation.  4. Hypertension we will continue her Dilantin, metoprolol, Norvasc. Along with aspirin.  5. Dyslipidemia. He is on Lipitor.  6. Diabetes. He is on glipizide with Accu-Cheks.  7. Gout. We continue allopurinol and colchicine.  8. Edema. We will continue furosemide.   9. UTI treated on medical floor hopefully resolved.   10. Diabetic foot. Wound care consult completed.   11. Disposition. To be established. He will most likely will return to home with his wife. Placement is not out of the question. Medicine is to see him  today. Multiple medical problems including foot ulcer. No change to medication today will follow-up as to medicine consult.  Severe bipolar depression. Affect slightly brighter but that is very slight still very withdrawn. Psychotic. I am hoping that we can start ECT on Wednesday. The patient himself was agreeable to the plan. We will discuss it further with his wife tomorrow and try and get consent signed. The rest of his labs seem to be in good shape. Meanwhile no changed today to medication. Kerry Chisolm 07/20/2015, 2:53 PM 2

## 2015-07-20 NOTE — Progress Notes (Signed)
Pt awake most of the day.  He has sat up and ate meals.  Attended group.  Sitter continued for safety.

## 2015-07-20 NOTE — Progress Notes (Signed)
Pt awake this am.  He was restless in the bed this am but more redirectable at this time.  Pt had two large episodes of urinary incontinence.  A clean urine spec was unable to be obtained at this time.  Will cont to attempt to get a clean urine spec.  Patient was cleaned and changed.  He was dressed and is currently sitting in the day room with the sitter and eating a snack.  He is responding more appropriate to questions although he said, "I am invisible" and "they are going to bring this whole building down".   Will cont to monitor for safety.

## 2015-07-20 NOTE — Progress Notes (Signed)
Patient ID: Christian [name], male   DOB: [birth year], 64 y.o.   MRN: [id number] [facility] PROGRESS NOTE  Rickard Kennerly Minton NUU:[id number] DOB: Sep 07, 1951 DOA: 07/16/2015 PCP: Leanor Rubenstein, MD  HPI/Subjective: Patient states that he thinks something and then it's not right in his head. He states he ate a good lunch but it's not documented on how much he ate at this point. He did not eat breakfast or dinner last night.   Objective: Filed Vitals:   07/20/15 0851  BP: 122/80  Pulse: 108  Temp: 98.3 F (36.8 C)  Resp: 18    Intake/Output Summary (Last 24 hours) at 07/20/15 1254 Last data filed at 07/20/15 1115  Gross per 24 hour  Intake    960 ml  Output    700 ml  Net    260 ml   Filed Weights    ROS: Review of Systems  Constitutional: Negative for fever and chills.  Eyes: Negative for blurred vision.  Respiratory: Positive for cough. Negative for shortness of breath.   Cardiovascular: Negative for chest pain.  Gastrointestinal: Negative for nausea, vomiting, abdominal pain, diarrhea and constipation.  Genitourinary: Negative for dysuria.  Musculoskeletal: Negative for joint pain.  Neurological: Negative for dizziness and headaches.   Exam: Physical Exam  HENT:  Nose: No mucosal edema.  Mouth/Throat: No oropharyngeal exudate or posterior oropharyngeal edema.  Eyes: Conjunctivae, EOM and lids are normal. Pupils are equal, round, and reactive to light.  Neck: No JVD present. Carotid bruit is not present. No edema present. No thyroid mass and no thyromegaly present.  Cardiovascular: S1 normal and S2 normal.  Exam reveals no gallop.   No murmur heard. Pulses:      Dorsalis pedis pulses are 2+ on the right side, and 2+ on the left side.  Respiratory: No respiratory distress. He has no wheezes. He has no rhonchi. He has no rales.  GI: Soft. Bowel sounds are normal. There is no tenderness.  Musculoskeletal:       Right ankle: He exhibits swelling.       Left  ankle: He exhibits swelling.  Lymphadenopathy:    He has no cervical adenopathy.  Neurological: He is alert. No cranial nerve deficit.  Skin: Skin is warm. No rash noted. Nails show no clubbing.  Left heel blood blister covered in bandage by podiatry.  Psychiatric: His speech is normal. His affect is blunt.    Data Reviewed: Basic Metabolic Panel:  Recent Labs Lab 07/14/15 0540 07/15/15 0431 07/19/15 1221 07/20/15 0733  NA 136 137 133* 140  K 3.8 4.3 4.8 4.4  CL 97* 102 97* 99*  CO2 GLUCOSE 232* 196* 268* 182*  BUN 25* 25* 29* 27*  CREATININE 1.74* 1.28* 1.61* 1.56*  CALCIUM 8.8* 8.5* 9.0 9.3   CBC:  Recent Labs Lab 07/14/15 0540 07/15/15 0431 07/19/15 1221 07/20/15 0733  WBC 5.2 4.7 9.6 11.1*  NEUTROABS  --   --   --  8.8*  HGB 15.1 13.4 15.5 16.0  HCT 45.4 39.5* 47.0 47.9  MCV 91.3 88.4 90.4 89.9  PLT 206 179 233 276    CBG:  Recent Labs Lab 07/15/15 1639 07/16/15 0752 07/18/15 0613 07/18/15 2035 07/19/15 0612  GLUCAP 237* 239* 122* 180* 141*    Recent Results (from the past 240 hour(s))  MRSA PCR Screening     Status: None   Collection Time: 07/11/15  6:26 PM  Result Value Ref Range Status  MRSA by PCR NEGATIVE NEGATIVE Final    Comment:        The GeneXpert MRSA Assay (FDA approved for NASAL specimens only), is one component of a comprehensive MRSA colonization surveillance program. It is not intended to diagnose MRSA infection nor to guide or monitor treatment for MRSA infections.   Urine culture     Status: None   Collection Time: 07/14/15 12:13 PM  Result Value Ref Range Status   Specimen Description URINE, RANDOM  Final   Special Requests Normal  Final   Culture NO GROWTH 1 DAY  Final   Report Status 07/16/2015 FINAL  Final     Studies: No results found.  Scheduled Meds: . allopurinol  300 mg Oral Daily  . amLODipine  10 mg Oral Daily  . aspirin  81 mg Oral Daily  . atorvastatin  10 mg Oral q1800  .  cholecalciferol  400 Units Oral Daily  . colchicine  0.6 mg Oral BID  . feeding supplement (GLUCERNA SHAKE)  237 mL Oral TID WC  . glipiZIDE  5 mg Oral Q breakfast  . hydrALAZINE  50 mg Oral 4 times per day  . metoprolol succinate  200 mg Oral Daily  . mirtazapine  30 mg Oral QHS  . QUEtiapine  400 mg Oral QHS  . traZODone  25 mg Oral TID    Assessment/Plan:  1. Dehydration, acute on chronic kidney disease stage III- patient was advised that he must stay hydrated and eat and drink. Since he ate lunch, we can continue to watch him down on the psychiatry floor. I think his mood is affecting the way he is eating. 2. Intact blood Blister left heel- seen in consultation by podiatry. 3. Essential hypertension continue usual medications. 4. Diabetes type 2- if the patient is given a miss meals and rather not give glipizide and rather go with sliding scale. 5. Hyperlipidemia unspecified continue atorvastatin 6. History of gout on allopurinol and colchicine.  Code Status:     Code Status Orders        Start     Ordered   07/16/15 1213  Full code   Continuous     07/16/15 1213     Disposition Plan: To be determined  Time spent: 23 minutes  Alford Highland  [facility]

## 2015-07-20 NOTE — [facility] Group Notes (Signed)
[facility] LCSW Group Therapy  07/20/2015 3:42 PM  Type of Therapy:  Group Therapy  Participation Level:  Minimal  Participation Quality:  Attentive  Affect:  Flat  Cognitive:  Disorganized  Insight:  Limited  Engagement in Therapy:  Limited  Modes of Intervention:  Discussion, Education, Socialization and Support  Summary of Progress/Problems:Todays topic: Grudges  Patients will be encouraged to discuss their thoughts, feelings, and behaviors as to why one holds on to grudges and reasons why people have grudges. Patients will process the impact of grudges on their daily lives and identify thoughts and feelings related to holding grudges. Patients will identify feelings and thoughts related to what life would look like without grudges. Mohamad attended group and stayed the entire time. He participated but was off topic. He discussed how his wife was given a pill to make things seem better but then stated she brought in her purse and keys. He was unable to elaborate or connect it to the topic.   [employer] MSW, LCSWA  07/20/2015, 3:42 PM

## 2015-07-20 NOTE — Progress Notes (Signed)
I spoke with the pt's wife and she stressed concern that the pt would be discharged prior to "getting well". She stated that the family recently "took down the cord" (from the tree) that the pt had threatened to hang himself with.  She was comforted and reassured that staff was doing all we could for the pt.  Patient was more alert tonight and was able to tell wife specific events from the day.   Will cont to monitor for safety.

## 2015-07-20 NOTE — Progress Notes (Signed)
Patient is refusing all care, including medications, CPAP machine, vitals, food, drink, and adult briefs. He grimaces when repositioning but declines to discuss his pain. He does not respond to questioning, other than to state, " I don't want anything". An order for urine is in, however the patient has not voided since approximately Saturday morning and has no current fluid intake. Dr. Toni Amend was consulted in regards to his current physical state (irregular heart rate, dehydration, possible UTI) and possible treatment options. He noted he will assess the patient on Sunday. The patient does not participate in the assessment process.

## 2015-07-20 NOTE — [facility] Group Notes (Signed)
[facility] Group Notes:  (Nursing/MHT/Case Management/Adjunct)  Date:  07/20/2015  Time:  12:34 AM  Type of Therapy:  Group Therapy/Outside Activity  Participation Level:  Did Not Attend  Participation Quality:  N/A  Affect:  N/A  Cognitive:  N/A  Insight:  None  Engagement in Group:  None  Modes of Intervention:  Activity  Summary of Progress/Problems:  Tomasita Morrow 07/20/2015, 12:34 AM

## 2015-07-20 NOTE — Progress Notes (Signed)
Pt refused to wear CPAP. RN aware

## 2015-07-20 NOTE — Plan of Care (Signed)
Problem: Ineffective individual coping Goal: LTG: Patient will report a decrease in negative feelings Outcome: Not Progressing Not progressing. Goal: STG: Pt will be able to identify effective and ineffective STG: Pt will be able to identify effective and ineffective coping patterns  Outcome: Not Progressing Not progressing.  Goal: STG: Patient will remain free from self harm Outcome: Not Progressing Passive self harm. Refusing all care.  Goal: STG-Increase in ability to manage activities of daily living Outcome: Not Progressing Not progressing.

## 2015-07-21 ENCOUNTER — Inpatient Hospital Stay: Payer: Commercial Managed Care - HMO

## 2015-07-21 ENCOUNTER — Other Ambulatory Visit: Payer: Self-pay

## 2015-07-21 LAB — GLUCOSE, CAPILLARY
Glucose-Capillary: 319 mg/dL — ABNORMAL HIGH (ref 65–99)
Glucose-Capillary: 272 mg/dL — ABNORMAL HIGH (ref 65–99)
Glucose-Capillary: 416 mg/dL — ABNORMAL HIGH (ref 65–99)
Glucose-Capillary: 146 mg/dL — ABNORMAL HIGH (ref 65–99)
Glucose-Capillary: 128 mg/dL — ABNORMAL HIGH (ref 65–99)
Glucose-Capillary: 164 mg/dL — ABNORMAL HIGH (ref 65–99)
Glucose-Capillary: 169 mg/dL — ABNORMAL HIGH (ref 65–99)
Glucose-Capillary: 237 mg/dL — ABNORMAL HIGH (ref 65–99)
Glucose-Capillary: 84 mg/dL (ref 65–99)
Glucose-Capillary: 83 mg/dL (ref 65–99)
Glucose-Capillary: 83 mg/dL (ref 65–99)
Glucose-Capillary: 181 mg/dL — ABNORMAL HIGH (ref 65–99)
Glucose-Capillary: 243 mg/dL — ABNORMAL HIGH (ref 65–99)
Glucose-Capillary: 99 mg/dL (ref 65–99)
Glucose-Capillary: 134 mg/dL — ABNORMAL HIGH (ref 65–99)
Glucose-Capillary: 138 mg/dL — ABNORMAL HIGH (ref 65–99)

## 2015-07-21 MED ORDER — HYDRALAZINE HCL 25 MG PO TABS
25.0000 mg | ORAL_TABLET | Freq: Four times a day (QID) | ORAL | Status: DC
  Administered 2015-07-21 – 2015-07-22 (×3): 25 mg via ORAL
  Filled 2015-07-21 (×2): qty 1

## 2015-07-21 MED ORDER — COLCHICINE 0.6 MG PO TABS
0.6000 mg | ORAL_TABLET | Freq: Every day | ORAL | Status: DC
  Administered 2015-07-25 – 2015-07-29 (×5): 0.6 mg via ORAL
  Filled 2015-07-21 (×8): qty 1

## 2015-07-21 NOTE — Consult Note (Signed)
[facility] Consult   Reason for Consult:  Consult follow-up for this 64 year old man with bipolar disorder depressed with psychotic features Referring Physician:  Pucilowska Patient Identification: Christian [name] MRN:  [id number] Principal Diagnosis: Bipolar disorder, current episode depressed, severe, with psychotic features Diagnosis:   Patient Active Problem List   Diagnosis Date Noted  . Bipolar disorder, current episode depressed, severe, with psychotic features [F31.5] 07/09/2015  . Diabetes [E11.9] 07/09/2015  . UTI (urinary tract infection) [N39.0] 06/19/2014  . Positive blood culture [R78.81] 06/19/2014  . Bipolar affective disorder, current episode manic with psychotic symptoms [F31.2] 06/17/2014  . Type II or unspecified type diabetes mellitus with unspecified complication, uncontrolled [E11.8] 06/02/2014  . Other and unspecified hyperlipidemia [E78.5] 06/02/2014  . OSA on CPAP [G47.33] 06/02/2014  . 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 Gout [M10.9] 06/02/2014  . Hypertension [I10]     Total Time spent with patient: 45 minutes  Subjective:   Christian [name] is a 64 y.o. male patient admitted with patient was admitted with severe depression with psychotic features.  HPI:  C earlier workup. Patient well known to [place]. Severe bipolar disorder with current depression with psychotic features only partially and intermittently responsive to medication. Consultation was for ECT. HPI Elements:   Quality:  severe depression with psychosis. Severity:  life-threatening. Timing:  chronic worse for months. Duration:  ongoing. Context:  [place] to respond to treatment and multiple lengthy hospitalizations.  Past Medical History:  Past Medical History  Diagnosis Date  . Mood swings   . Type II or unspecified type diabetes mellitus with unspecified complication, uncontrolled 06/02/2014  . Other and unspecified  hyperlipidemia 06/02/2014  . OSA on CPAP 06/02/2014  . 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 Bipolar disorder   . CHF (congestive heart failure)   . CKD (chronic kidney disease), stage III   . Diabetes mellitus without complication   . Hypertension   . Gout   . Bipolar 1 disorder   . OSA on CPAP    History reviewed. No pertinent past surgical history. Family History:  Family History  Problem Relation Age of Onset  . Dementia Mother   . Arthritis Father   . Heart failure Neg Hx   . Kidney failure Neg Hx   . Cancer Neg Hx    Social History:  History  Alcohol Use No     History  Drug Use No    Social History   Social History  . Marital Status: Married    Spouse Name: N/A  . Number of Children: N/A  . Years of Education: N/A   Social History Main Topics  . Smoking status: Never Smoker   . Smokeless tobacco: None  . Alcohol Use: No  . Drug Use: No  . Sexual Activity: Not Currently   Other Topics Concern  . None   Social History Narrative   ** Merged History Encounter **       Additional Social History:    Pain Medications:  (uta) Prescriptions:  (uta) Over the Counter:  Pincus Badder) History of alcohol / drug use?:  (uta) Longest period of sobriety (when/how long):  (uta) Negative Consequences of Use:  (uta) Withdrawal Symptoms:  (uta)                     Allergies:  No Known Allergies  Labs:  Results for orders placed or performed during the  hospital encounter of 07/16/15 (from the past 48 hour(s))  CBC with Differential/Platelet     Status: Abnormal   Collection Time: 07/20/15  7:33 AM  Result Value Ref Range   WBC 11.1 (H) 3.8 - 10.6 K/uL   RBC 5.33 4.40 - 5.90 MIL/uL   Hemoglobin 16.0 13.0 - 18.0 g/dL   HCT 47.9 40.0 - 52.0 %   MCV 89.9 80.0 - 100.0 fL   MCH 30.1 26.0 - 34.0 pg   MCHC 33.5 32.0 - 36.0 g/dL   RDW 12.7 11.5 - 14.5 %   Platelets 276 150 - 440 K/uL   Neutrophils Relative % 79 %   Neutro Abs  8.8 (H) 1.4 - 6.5 K/uL   Lymphocytes Relative 10 %   Lymphs Abs 1.1 1.0 - 3.6 K/uL   Monocytes Relative 11 %   Monocytes Absolute 1.2 (H) 0.2 - 1.0 K/uL   Eosinophils Relative 0 %   Eosinophils Absolute 0.0 0 - 0.7 K/uL   Basophils Relative 0 %   Basophils Absolute 0.0 0 - 0.1 K/uL  Basic metabolic panel     Status: Abnormal   Collection Time: 07/20/15  7:33 AM  Result Value Ref Range   Sodium 140 135 - 145 mmol/L   Potassium 4.4 3.5 - 5.1 mmol/L   Chloride 99 (L) 101 - 111 mmol/L   CO2 27 22 - 32 mmol/L   Glucose, Bld 182 (H) 65 - 99 mg/dL   BUN 27 (H) 6 - 20 mg/dL   Creatinine, Ser 1.56 (H) 0.61 - 1.24 mg/dL   Calcium 9.3 8.9 - 10.3 mg/dL   GFR calc non Af Amer 46 (L) >60 mL/min   GFR calc Af Amer 54 (L) >60 mL/min    Comment: (NOTE) The eGFR has been calculated using the CKD EPI equation. This calculation has not been validated in all clinical situations. eGFR's persistently <60 mL/min signify possible Chronic Kidney Disease.    Anion gap 14 5 - 15  Urinalysis complete, with microscopic ([facility] only)     Status: Abnormal   Collection Time: 07/20/15 11:46 AM  Result Value Ref Range   Color, Urine YELLOW (A) YELLOW   APPearance CLEAR (A) CLEAR   Glucose, UA 150 (A) NEGATIVE mg/dL   Bilirubin Urine NEGATIVE NEGATIVE   Ketones, ur TRACE (A) NEGATIVE mg/dL   Specific Gravity, Urine 1.018 1.005 - 1.030   Hgb urine dipstick NEGATIVE NEGATIVE   pH 5.0 5.0 - 8.0   Protein, ur 100 (A) NEGATIVE mg/dL   Nitrite NEGATIVE NEGATIVE   Leukocytes, UA NEGATIVE NEGATIVE   RBC / HPF 0-5 0 - 5 RBC/hpf   WBC, UA 0-5 0 - 5 WBC/hpf   Bacteria, UA NONE SEEN NONE SEEN   Squamous Epithelial / LPF 0-5 (A) NONE SEEN   Mucous PRESENT   Glucose, capillary     Status: Abnormal   Collection Time: 07/20/15  4:13 PM  Result Value Ref Range   Glucose-Capillary 243 (H) 65 - 99 mg/dL  Glucose, capillary     Status: None   Collection Time: 07/20/15  9:21 PM  Result Value Ref Range    Glucose-Capillary 99 65 - 99 mg/dL  Glucose, capillary     Status: Abnormal   Collection Time: 07/21/15  7:41 AM  Result Value Ref Range   Glucose-Capillary 134 (H) 65 - 99 mg/dL  Glucose, capillary     Status: Abnormal   Collection Time: 07/21/15 11:52 AM  Result Value Ref Range  Glucose-Capillary 138 (H) 65 - 99 mg/dL    Vitals: Blood pressure 114/75, pulse 104, temperature 97.2 F (36.2 C), temperature source Axillary, resp. rate 18, SpO2 96 %.  Risk to Self: Is patient at risk for suicide?: Yes (yes) Risk to Others:   Prior Inpatient Therapy:   Prior Outpatient Therapy:    Current Facility-Administered Medications  Medication Dose Route Frequency Provider Last Rate Last Dose  . acetaminophen (TYLENOL) tablet 650 mg  650 mg Oral Q6H PRN Clovis Fredrickson, MD   650 mg at 07/18/15 1934  . allopurinol (ZYLOPRIM) tablet 300 mg  300 mg Oral Daily Jolanta B Pucilowska, MD   300 mg at 07/21/15 0944  . alum & mag hydroxide-simeth (MAALOX/MYLANTA) 200-200-20 MG/5ML suspension 30 mL  30 mL Oral Q4H PRN Jolanta B Pucilowska, MD      . aspirin chewable tablet 81 mg  81 mg Oral Daily Jolanta B Pucilowska, MD   81 mg at 07/21/15 0944  . atorvastatin (LIPITOR) tablet 10 mg  10 mg Oral q1800 Jolanta B Pucilowska, MD   10 mg at 07/21/15 1747  . cholecalciferol (VITAMIN D) tablet 400 Units  400 Units Oral Daily Clovis Fredrickson, MD   400 Units at 07/21/15 0943  . [START ON 07/22/2015] colchicine tablet 0.6 mg  0.6 mg Oral Daily Loletha Grayer, MD      . diphenhydrAMINE (BENADRYL) injection 50 mg  50 mg Intramuscular Q6H PRN Clovis Fredrickson, MD   50 mg at 07/16/15 1951  . feeding supplement (GLUCERNA SHAKE) (GLUCERNA SHAKE) liquid 237 mL  237 mL Oral TID WC Jolanta B Pucilowska, MD   237 mL at 07/21/15 1729  . hydrALAZINE (APRESOLINE) tablet 25 mg  25 mg Oral 4 times per day Loletha Grayer, MD   25 mg at 07/21/15 1730  . insulin aspart (novoLOG) injection 0-5 Units  0-5 Units  Subcutaneous QHS Loletha Grayer, MD   0 Units at 07/20/15 2130  . insulin aspart (novoLOG) injection 0-9 Units  0-9 Units Subcutaneous TID WC Loletha Grayer, MD   2 Units at 07/21/15 1750  . magnesium hydroxide (MILK OF MAGNESIA) suspension 30 mL  30 mL Oral Daily PRN Jolanta B Pucilowska, MD      . metoprolol succinate (TOPROL-XL) 24 hr tablet 200 mg  200 mg Oral Daily Jolanta B Pucilowska, MD   200 mg at 07/21/15 0944  . mirtazapine (REMERON) tablet 30 mg  30 mg Oral QHS Clovis Fredrickson, MD   30 mg at 07/20/15 2115  . QUEtiapine (SEROQUEL) tablet 400 mg  400 mg Oral QHS Clovis Fredrickson, MD   400 mg at 07/20/15 2115  . traZODone (DESYREL) tablet 25 mg  25 mg Oral TID Clovis Fredrickson, MD   Stopped at 07/21/15 1550    Musculoskeletal: Strength & Muscle Tone: decreased Gait & Station: unable to stand Patient leans: N/A  Psychiatric Specialty Exam: Physical Exam  Nursing note and vitals reviewed. Constitutional: He appears well-developed and well-nourished. He appears distressed.  HENT:  Head: Normocephalic and atraumatic.  Eyes: Conjunctivae are normal. Pupils are equal, round, and reactive to light.  Neck: Normal range of motion.  Cardiovascular: Normal heart sounds.   Respiratory: Effort normal.  GI: Soft.  Musculoskeletal: Normal range of motion.  Neurological: He is alert. Coordination abnormal.  Skin: Skin is warm and dry. Rash noted.  Psychiatric: His affect is blunt. His speech is slurred. He is slowed and withdrawn. Cognition and memory are impaired. He expresses  impulsivity and inappropriate judgment. He exhibits a depressed mood. He is noncommunicative. He exhibits abnormal recent memory and abnormal remote memory.  Patient is withdrawn. Not communicating well today.    Review of Systems  Constitutional: Positive for malaise/fatigue.  HENT: Negative.   Eyes: Negative.   Respiratory: Negative.   Cardiovascular: Negative.   Gastrointestinal: Negative.    Musculoskeletal: Negative.   Skin: Positive for rash.  Neurological: Positive for weakness.  Psychiatric/Behavioral: Positive for depression, suicidal ideas and memory loss. Negative for hallucinations and substance abuse. The patient is nervous/anxious and has insomnia.     Blood pressure 114/75, pulse 104, temperature 97.2 F (36.2 C), temperature source Axillary, resp. rate 18, SpO2 96 %.There is no weight on file to calculate BMI.  General Appearance: Disheveled  Eye Contact::  Minimal  Speech:  Slow and Slurred  Volume:  Decreased  Mood:  Depressed  Affect:  Depressed  Thought Process:  Loose  Orientation:  Full (Time, Place, and Person)  Thought Content:  Rumination  Suicidal Thoughts:  Yes.  without intent/plan  Homicidal Thoughts:  No  Memory:  Immediate;   Fair Recent;   Poor Remote;   Poor  Judgement:  Impaired  Insight:  Lacking  Psychomotor Activity:  Decreased  Concentration:  Fair  Recall:  [place] of Knowledge:Poor  Language: Poor  Akathisia:  No  Handed:  Right  AIMS (if indicated):     Assets:  Desire for Improvement Social Support  ADL's:  Impaired  Cognition: Impaired,  Mild  Sleep:  Number of Hours: 7   Medical Decision Making: Review of Psycho-Social Stressors (1), Established Problem, Worsening (2) and Review of Medication Regimen & Side Effects (2)  Treatment Plan Summary: Plan patient is a good candidate for ECT having had ECT with success in the past and having severe depression with psychosis that has not responded to medication. We spoke with him today but found him to probably not be showing full capacity. His wife is agreeable to ECT and will sign consent on Wednesday. I would like to proceed with bilateral ECT for this severely ill man starting Wednesday. Orders will be added placed. Patient has been informed and does not have any complaint I think that he does understand and agree  Plan:  note as above we will be transferring him probably  on Wednesday and starting ECT Disposition: see above  Alethia Berthold 07/21/2015 6:50 PM

## 2015-07-21 NOTE — Progress Notes (Signed)
D: Pt has remained in his bed  Today. It was attempted mult times to get pt up. He refused but did turn and reposition himself in the bed.  Pt mood is apathetic and his affect is sad/depressed.He did eat some dinner this evening and drank the "great shake".    A: Writer provided emotional support administered medications as prescribed, crushed in apple sauce. The wound to his foot was changed. R: Pt has very minimal response to staff but follows simple instructions. 1:1 sitter remains with pt.

## 2015-07-21 NOTE — Progress Notes (Signed)
Patient ID: Christian [name], male   DOB: 1951/06/07, 64 y.o.   MRN: [id number]  [facility] PROGRESS NOTE  Rayanthony Moussa Goodnough WKM:[id number] DOB: [birth year] DOA: 07/16/2015 PCP: Leanor Rubenstein, MD  HPI/Subjective: Patient just lying in bed today and did not eat breakfast or lunch. I physically had to feed him a mighty shake via a straw. He drank half of the mighty shake for me. I advised him if he does not eat he will die. He states the reason is not eating is because he is depressed.   Objective: Filed Vitals:   07/21/15 1224  BP: 114/75  Pulse: 104  Temp: 97.2 F (36.2 C)  Resp: 18    Intake/Output Summary (Last 24 hours) at 07/21/15 1523 Last data filed at 07/21/15 1331  Gross per 24 hour  Intake    240 ml  Output      0 ml  Net    240 ml   Filed Weights    ROS: Review of Systems  Unable to perform ROS  secondary to severe depression Exam: Physical Exam  HENT:  Nose: No mucosal edema.  Mouth/Throat: No oropharyngeal exudate or posterior oropharyngeal edema.  Eyes: Conjunctivae, EOM and lids are normal. Pupils are equal, round, and reactive to light.  Neck: No JVD present. Carotid bruit is not present. No edema present. No thyroid mass and no thyromegaly present.  Cardiovascular: S1 normal and S2 normal.  Exam reveals no gallop.   No murmur heard. Pulses:      Dorsalis pedis pulses are 2+ on the right side, and 2+ on the left side.  Respiratory: No respiratory distress. He has no wheezes. He has no rhonchi. He has no rales.  GI: Soft. Bowel sounds are normal. There is no tenderness.  Musculoskeletal:       Right ankle: He exhibits swelling.       Left ankle: He exhibits swelling.  Lymphadenopathy:    He has no cervical adenopathy.  Neurological: He is alert. No cranial nerve deficit.  Skin: Skin is warm. No rash noted. Nails show no clubbing.  Large Left medial foot blood blister with skin intact.  Psychiatric: His speech is normal. His affect is  blunt.    Data Reviewed: Basic Metabolic Panel:  Recent Labs Lab 07/15/15 0431 07/19/15 1221 07/20/15 0733  NA 137 133* 140  K 4.3 4.8 4.4  CL 102 97* 99*  CO2 26 24 27   GLUCOSE 196* 268* 182*  BUN 25* 29* 27*  CREATININE 1.28* 1.61* 1.56*  CALCIUM 8.5* 9.0 9.3   CBC:  Recent Labs Lab 07/15/15 0431 07/19/15 1221 07/20/15 0733  WBC 4.7 9.6 11.1*  NEUTROABS  --   --  8.8*  HGB 13.4 15.5 16.0  HCT 39.5* 47.0 47.9  MCV 88.4 90.4 89.9  PLT 179 233 276    CBG:  Recent Labs Lab 07/15/15 1639 07/16/15 0752 07/18/15 0613 07/18/15 2035 07/19/15 0612  GLUCAP 237* 239* 122* 180* 141*    Recent Results (from the past 240 hour(s))  MRSA PCR Screening     Status: None   Collection Time: 07/11/15  6:26 PM  Result Value Ref Range Status   MRSA by PCR NEGATIVE NEGATIVE Final    Comment:        The GeneXpert MRSA Assay (FDA approved for NASAL specimens only), is one component of a comprehensive MRSA colonization surveillance program. It is not intended to diagnose MRSA infection nor to guide or monitor treatment for MRSA  infections.   Urine culture     Status: None   Collection Time: 07/14/15 12:13 PM  Result Value Ref Range Status   Specimen Description URINE, RANDOM  Final   Special Requests Normal  Final   Culture NO GROWTH 1 DAY  Final   Report Status 07/16/2015 FINAL  Final     Studies: No results found.  Scheduled Meds: . allopurinol  300 mg Oral Daily  . aspirin  81 mg Oral Daily  . atorvastatin  10 mg Oral q1800  . cholecalciferol  400 Units Oral Daily  . [START ON 07/22/2015] colchicine  0.6 mg Oral Daily  . feeding supplement (GLUCERNA SHAKE)  237 mL Oral TID WC  . hydrALAZINE  25 mg Oral 4 times per day  . insulin aspart  0-5 Units Subcutaneous QHS  . insulin aspart  0-9 Units Subcutaneous TID WC  . metoprolol succinate  200 mg Oral Daily  . mirtazapine  30 mg Oral QHS  . QUEtiapine  400 mg Oral QHS  . traZODone  25 mg Oral TID     Assessment/Plan:  1. Dehydration, acute on chronic kidney disease stage III.- I physically had to feed the patient half of a mighty shake today. I advised the nursing staff that they must physically feed the patient every meal. I advised them that he will die if he does not eat 2. Intact blood Blister left heel- skin intact, no further treatment. Keep covered. 3. Essential hypertension- since he is not eating I will DC Norvasc and decreased dose of hydralazine. 4. Diabetes type 2- if the patient is given a miss meals and rather not give glipizide and rather go with sliding scale. 5. Hyperlipidemia unspecified continue atorvastatin 6. History of gout on allopurinol and colchicine. 7. Severe depression- psychiatry following.  Code Status:     Code Status Orders        Start     Ordered   07/16/15 1213  Full code   Continuous     07/16/15 1213     Disposition Plan: To be determined  Time spent: 25 minutes  Alford Highland  [facility]

## 2015-07-21 NOTE — Progress Notes (Signed)
[facility] MD Progress Note  07/21/2015 1:32 PM Christian [name]  MRN:  [id number]  Subjective:  Christian [name] is not confused today. He is able to answer simple questions. He remembers that he spoke to Dr. Shelbie Ammons about ECT and agreed to. He complains of pain all over and has not been getting out of bed today. He takes his medications as. Oral intake is poor. He was consulted by medicine over the weekend. No new medical problems were discovered. Medicine input is greatly appreciated  Principal Problem: Bipolar disorder, current episode depressed, severe, with psychotic features Diagnosis:   Patient Active Problem List   Diagnosis Date Noted  . Bipolar disorder, current episode depressed, severe, with psychotic features [F31.5] 07/09/2015  . Diabetes [E11.9] 07/09/2015  . UTI (urinary tract infection) [N39.0] 06/19/2014  . Positive blood culture [R78.81] 06/19/2014  . Bipolar affective disorder, current episode manic with psychotic symptoms [F31.2] 06/17/2014  . Type II or unspecified type diabetes mellitus with unspecified complication, uncontrolled [E11.8] 06/02/2014  . Other and unspecified hyperlipidemia [E78.5] 06/02/2014  . OSA on CPAP [G47.33] 06/02/2014  . 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 Gout [M10.9] 06/02/2014  . Hypertension [I10]    Total Time spent with patient: 20 minutes   Past Medical History:  Past Medical History  Diagnosis Date  . Mood swings   . Type II or unspecified type diabetes mellitus with unspecified complication, uncontrolled 06/02/2014  . Other and unspecified hyperlipidemia 06/02/2014  . OSA on CPAP 06/02/2014  . 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 Bipolar disorder   . CHF (congestive heart failure)   . CKD (chronic kidney disease), stage III   . Diabetes mellitus without complication   . Hypertension   . Gout   . Bipolar 1 disorder   . OSA on CPAP    History  reviewed. No pertinent past surgical history. Family History:  Family History  Problem Relation Age of Onset  . Dementia Mother   . Arthritis Father   . Heart failure Neg Hx   . Kidney failure Neg Hx   . Cancer Neg Hx    Social History:  History  Alcohol Use No     History  Drug Use No    Social History   Social History  . Marital Status: Married    Spouse Name: N/A  . Number of Children: N/A  . Years of Education: N/A   Social History Main Topics  . Smoking status: Never Smoker   . Smokeless tobacco: None  . Alcohol Use: No  . Drug Use: No  . Sexual Activity: Not Currently   Other Topics Concern  . None   Social History Narrative   ** Merged History Encounter **       Additional History:    Sleep: Fair  Appetite:  Poor   Assessment:   Musculoskeletal: Strength & Muscle Tone: within normal limits Gait & Station: normal Patient leans: N/A   Psychiatric Specialty Exam: Physical Exam  Nursing note and vitals reviewed.   Review of Systems  Musculoskeletal: Positive for myalgias.  All other systems reviewed and are negative.   Blood pressure 114/75, pulse 104, temperature 97.2 F (36.2 C), temperature source Axillary, resp. rate 18, SpO2 96 %.There is no weight on file to calculate BMI.  General Appearance: Casual  Eye Contact::  None  Speech:  Slow  Volume:  Decreased  Mood:  Depressed  Affect:  Depressed  Thought Process:  Logical  Orientation:  Full (Time, Place, and Person)  Thought Content:  WDL  Suicidal Thoughts:  Yes.  without intent/plan  Homicidal Thoughts:  No  Memory:  Immediate;   Fair Recent;   Fair Remote;   Fair  Judgement:  Fair  Insight:  Fair  Psychomotor Activity:  Decreased  Concentration:  Fair  Recall:  [employer] of Knowledge:Fair  Language: Fair  Akathisia:  No  Handed:  Right  AIMS (if indicated):     Assets:  Agricultural consultant Housing Social Support  ADL's:  Intact   Cognition: WNL  Sleep:  Number of Hours: 7     Current Medications: Current Facility-Administered Medications  Medication Dose Route Frequency Provider Last Rate Last Dose  . acetaminophen (TYLENOL) tablet 650 mg  650 mg Oral Q6H PRN Clovis Fredrickson, MD   650 mg at 07/18/15 1934  . allopurinol (ZYLOPRIM) tablet 300 mg  300 mg Oral Daily Jolanta B Pucilowska, MD   300 mg at 07/21/15 0944  . alum & mag hydroxide-simeth (MAALOX/MYLANTA) 200-200-20 MG/5ML suspension 30 mL  30 mL Oral Q4H PRN Jolanta B Pucilowska, MD      . amLODipine (NORVASC) tablet 10 mg  10 mg Oral Daily Jolanta B Pucilowska, MD   10 mg at 07/21/15 0944  . aspirin chewable tablet 81 mg  81 mg Oral Daily Jolanta B Pucilowska, MD   81 mg at 07/21/15 0944  . atorvastatin (LIPITOR) tablet 10 mg  10 mg Oral q1800 Jolanta B Pucilowska, MD   10 mg at 07/20/15 1708  . cholecalciferol (VITAMIN D) tablet 400 Units  400 Units Oral Daily Clovis Fredrickson, MD   400 Units at 07/21/15 0943  . colchicine tablet 0.6 mg  0.6 mg Oral BID Clovis Fredrickson, MD   0.6 mg at 07/21/15 0944  . diphenhydrAMINE (BENADRYL) injection 50 mg  50 mg Intramuscular Q6H PRN Clovis Fredrickson, MD   50 mg at 07/16/15 1951  . feeding supplement (GLUCERNA SHAKE) (GLUCERNA SHAKE) liquid 237 mL  237 mL Oral TID WC Jolanta B Pucilowska, MD   237 mL at 07/21/15 1206  . haloperidol lactate (HALDOL) injection 10 mg  10 mg Intramuscular Q6H PRN Jolanta B Pucilowska, MD   10 mg at 07/16/15 1951  . hydrALAZINE (APRESOLINE) tablet 50 mg  50 mg Oral 4 times per day Clovis Fredrickson, MD   50 mg at 07/21/15 1226  . insulin aspart (novoLOG) injection 0-5 Units  0-5 Units Subcutaneous QHS Loletha Grayer, MD   0 Units at 07/20/15 2130  . insulin aspart (novoLOG) injection 0-9 Units  0-9 Units Subcutaneous TID WC Loletha Grayer, MD   1 Units at 07/21/15 1230  . LORazepam (ATIVAN) injection 2 mg  2 mg Intramuscular Q2H PRN Gonzella Lex, MD   2 mg at 07/20/15  0941  . magnesium hydroxide (MILK OF MAGNESIA) suspension 30 mL  30 mL Oral Daily PRN Jolanta B Pucilowska, MD      . metoprolol succinate (TOPROL-XL) 24 hr tablet 200 mg  200 mg Oral Daily Jolanta B Pucilowska, MD   200 mg at 07/21/15 0944  . mirtazapine (REMERON) tablet 30 mg  30 mg Oral QHS Clovis Fredrickson, MD   30 mg at 07/20/15 2115  . QUEtiapine (SEROQUEL) tablet 400 mg  400 mg Oral QHS Clovis Fredrickson, MD   400 mg at 07/20/15 2115  . traZODone (DESYREL) tablet 25 mg  25 mg Oral TID Clovis Fredrickson, MD   25 mg at 07/21/15 0945    Lab Results:  Results for orders placed or performed during the hospital encounter of 07/16/15 (from the past 48 hour(s))  CBC with Differential/Platelet     Status: Abnormal   Collection Time: 07/20/15  7:33 AM  Result Value Ref Range   WBC 11.1 (H) 3.8 - 10.6 K/uL   RBC 5.33 4.40 - 5.90 MIL/uL   Hemoglobin 16.0 13.0 - 18.0 g/dL   HCT 47.9 40.0 - 52.0 %   MCV 89.9 80.0 - 100.0 fL   MCH 30.1 26.0 - 34.0 pg   MCHC 33.5 32.0 - 36.0 g/dL   RDW 12.7 11.5 - 14.5 %   Platelets 276 150 - 440 K/uL   Neutrophils Relative % 79 %   Neutro Abs 8.8 (H) 1.4 - 6.5 K/uL   Lymphocytes Relative 10 %   Lymphs Abs 1.1 1.0 - 3.6 K/uL   Monocytes Relative 11 %   Monocytes Absolute 1.2 (H) 0.2 - 1.0 K/uL   Eosinophils Relative 0 %   Eosinophils Absolute 0.0 0 - 0.7 K/uL   Basophils Relative 0 %   Basophils Absolute 0.0 0 - 0.1 K/uL  Basic metabolic panel     Status: Abnormal   Collection Time: 07/20/15  7:33 AM  Result Value Ref Range   Sodium 140 135 - 145 mmol/L   Potassium 4.4 3.5 - 5.1 mmol/L   Chloride 99 (L) 101 - 111 mmol/L   CO2 27 22 - 32 mmol/L   Glucose, Bld 182 (H) 65 - 99 mg/dL   BUN 27 (H) 6 - 20 mg/dL   Creatinine, Ser 1.56 (H) 0.61 - 1.24 mg/dL   Calcium 9.3 8.9 - 10.3 mg/dL   GFR calc non Af Amer 46 (L) >60 mL/min   GFR calc Af Amer 54 (L) >60 mL/min    Comment: (NOTE) The eGFR has been calculated using the CKD EPI equation. This  calculation has not been validated in all clinical situations. eGFR's persistently <60 mL/min signify possible Chronic Kidney Disease.    Anion gap 14 5 - 15  Urinalysis complete, with microscopic ([facility] only)     Status: Abnormal   Collection Time: 07/20/15 11:46 AM  Result Value Ref Range   Color, Urine YELLOW (A) YELLOW   APPearance CLEAR (A) CLEAR   Glucose, UA 150 (A) NEGATIVE mg/dL   Bilirubin Urine NEGATIVE NEGATIVE   Ketones, ur TRACE (A) NEGATIVE mg/dL   Specific Gravity, Urine 1.018 1.005 - 1.030   Hgb urine dipstick NEGATIVE NEGATIVE   pH 5.0 5.0 - 8.0   Protein, ur 100 (A) NEGATIVE mg/dL   Nitrite NEGATIVE NEGATIVE   Leukocytes, UA NEGATIVE NEGATIVE   RBC / HPF 0-5 0 - 5 RBC/hpf   WBC, UA 0-5 0 - 5 WBC/hpf   Bacteria, UA NONE SEEN NONE SEEN   Squamous Epithelial / LPF 0-5 (A) NONE SEEN   Mucous PRESENT     Physical Findings: AIMS: Facial and Oral Movements Muscles of Facial Expression: None, normal Lips and Perioral Area: None, normal Jaw: None, normal Tongue: None, normal,Extremity Movements Upper (arms, wrists, hands, fingers): None, normal Lower (legs, knees, ankles, toes): None, normal, Trunk Movements Neck, shoulders, hips: None, normal, Overall Severity Severity of abnormal movements (highest score from questions above): None, normal Incapacitation due to abnormal movements: None, normal Patient's awareness of abnormal movements (rate only patient's report): No Awareness, Dental Status Current problems with  teeth and/or dentures?: No Does patient usually wear dentures?: No  CIWA:    COWS:     Treatment Plan Summary: Daily contact with patient to assess and evaluate symptoms and progress in treatment and Medication management   Medical Decision Making:  Established Problem, Stable/Improving (1), Review of Psycho-Social Stressors (1), Review or order clinical lab tests (1), Review of Medication Regimen & Side Effects (2) and Review of New Medication or  Change in Dosage (2)   Christian [name] is an 64 year old male with a history of bipolar illness admitted for worsening of depression, psychosis, and agitated behavior.   1. Agitation. Resolved.   2. Mood and psychosis. The patient has been maintained on Seroquel 400 mg nightly for mood stabilization and Remeron for depression. He used to take Lamictal but this was discontinued on the medical floor. The patient may be a candidate for ECT  therefore I will not restart Lamictal.  3. ECT. Dr. Weber Cooks spoke with the patient.   4. Hypertension we will continue metoprolol, Norvasc, and Hydralazine along with aspirin.  5. Dyslipidemia. He is on Lipitor.  6. Diabetes. He is on glipizide with Accu-Cheks.  7. Gout. We continue allopurinol and colchicine.  8. Edema. We will continue furosemide.   9. UTI treated on medical floor hopefully resolved.   10. Diabetic foot. Wound care consult completed. Awaiting podiatry consult.  11. Disposition. To be established. He will most likely will return to home with his wife. Placement is not out of the question.        Jolanta Pucilowska 07/21/2015, 1:32 PM

## 2015-07-21 NOTE — Progress Notes (Signed)
In room resting at onset of shift. Sits up to side of bed for PM meds. Was medication compliant. 1:1 Sitter remains at bedside. Denied SI, HI, AVH. Had an uneventful night.

## 2015-07-21 NOTE — Progress Notes (Signed)
Recreation Therapy Notes  Date: 08.22.16 Time: 3:00 pm Location: Craft Room  Group Topic: Self-expression  Goal Area(s) Addresses:  Patient will identify one color per emotion listed on the wheel. Patient will verbalize benefit of using art as a means of self-expression. Patient will verbalize one emotion experienced during session. Patient will be educated on other forms of self-expression.  Behavioral Response: Did not attend  Intervention:  Emotion Wheel  Activity: Patients were given an Arboriculturist with seven emotions. Patients were instructed to pick a color for each emotion and color it in.  Education: LRT educated patients on different forms of self-expression.  Education Outcome: Patient did not attend group.  Clinical Observations/Feedback: Patient did not attend group.  Jacquelynn Cree, LRT/CTRS 07/21/2015 4:32 PM

## 2015-07-21 NOTE — Progress Notes (Signed)
Pt refused to wear C-PAP.

## 2015-07-22 LAB — GLUCOSE, CAPILLARY
Glucose-Capillary: 173 mg/dL — ABNORMAL HIGH (ref 65–99)
Glucose-Capillary: 186 mg/dL — ABNORMAL HIGH (ref 65–99)
Glucose-Capillary: 157 mg/dL — ABNORMAL HIGH (ref 65–99)

## 2015-07-22 NOTE — Progress Notes (Signed)
Recreation Therapy Notes  Date: 08.23.16 Time: 3:00 pm Location: Craft Room  Group Topic: Goal Setting  Goal Area(s) Addresses:  Patient will write at least one goal. Patient will write at least one obstacle.  Behavioral Response: Did not attend   Intervention: Recovery Goal Chart  Activity: Patients were instructed to make a Recovery Goal Chart including goals, and obstacles.   Education: LRT educated patients on healthy ways to celebrate meeting their goals.   Education Outcome: Patient did not attend group.   Clinical Observations/Feedback: Patient did not attend group.  Jacquelynn Cree, LRT/CTRS 07/22/2015 4:14 PM

## 2015-07-22 NOTE — [facility] Group Notes (Signed)
[facility] Group Notes:  (Nursing/MHT/Case Management/Adjunct)  Date:  07/22/2015  Time:  3:23 PM  Type of Therapy:  Psychoeducational Skills  Participation Level:  Did Not Attend    Mickey Farber 07/22/2015, 3:23 PM

## 2015-07-22 NOTE — Plan of Care (Signed)
Problem: Ineffective individual coping Goal: STG: Patient will remain free from self harm Outcome: Progressing Patient denies SI/HI, safety sitter at bedside.

## 2015-07-22 NOTE — Progress Notes (Signed)
Patient has been in bed all day and won't get up to eat or take medications. Bath given while he was in bed. He has been given small amounts of food while lying in bed with HOB raised. He ate a banana and drank Glucerna at dinner. At times he makes statements like he can't take medicine because he is dead. Offered emotional support. Will continue to monitor.

## 2015-07-22 NOTE — Progress Notes (Signed)
D: Pt denies SI/HI/AVH, Pt is not receptive to care and unwilling to participate in ADL. Affect is flat, sad and restless, thought process is altered with some confusion noted to time, place situation and person. Patient denies being  Anxious, minimal interaction with staff.  A: Pt was offered support and encouragement. Pt was given scheduled medications. 15 minute checks were done for safety.  R: Patient wasn't receptive to  taking medication. But after much persuasion he finally took medication. Pt has no complaints.Patient is turned and repositioned every 2 hours, safety maintained on unit.

## 2015-07-22 NOTE — Consult Note (Signed)
  Psychiatry: Follow-up note for 64 year old man with severe depression. Patient continues to eat very little. States he feels terrible and depressed. Gets out of bed almost none interaction is minimal. Thoughts are a little confused. Patient is scheduled for ECT tomorrow which hopefully will make a big difference for his condition.  Cardiology consult reviewed. Appreciate assistance. No hindrance to ECT.  Other labs are adequate for proceeding with ECT at this point. Orders in place. I recommend bilateral ECT because of the severity of his illness. Follow up in the morning at which point I anticipate probably taking over care.

## 2015-07-22 NOTE — Plan of Care (Signed)
Problem: Diagnosis: Increased Risk For Suicide Attempt Goal: LTG-Patient Will Report Improved Mood and Deny Suicidal LTG (by discharge) Patient will report improved mood and deny suicidal ideation.  Outcome: Progressing Patient denies SI/HI.  Goal: STG-Patient Will Comply With Medication Regime Outcome: Progressing Patient is compliant with medication.

## 2015-07-22 NOTE — Progress Notes (Signed)
[facility] MD Progress Note  07/22/2015 1:51 PM Christian [name]  MRN:  [id number]  Subjective:  Christian [name] remains desponded, negative and hopeless. He hardly eats or drinks. He has not been cooperating with nursing refusing medications or a bath. The patient does not have the capacity to make decision about ECT treatment and my understanding is that his wife is coming tomorrow to her with decision about ECT. He has no somatic complaints except for pain all over. He says that he is not doing well at all. Other than that does not engage in a conversation. He is unable to participate in programming on the unit. Medicine input is greatly appreciated.  Principal Problem: Bipolar disorder, current episode depressed, severe, with psychotic features Diagnosis:   Patient Active Problem List   Diagnosis Date Noted  . Bipolar disorder, current episode depressed, severe, with psychotic features [F31.5] 07/09/2015  . Diabetes [E11.9] 07/09/2015  . UTI (urinary tract infection) [N39.0] 06/19/2014  . Positive blood culture [R78.81] 06/19/2014  . Bipolar affective disorder, current episode manic with psychotic symptoms [F31.2] 06/17/2014  . Type II or unspecified type diabetes mellitus with unspecified complication, uncontrolled [E11.8] 06/02/2014  . Other and unspecified hyperlipidemia [E78.5] 06/02/2014  . OSA on CPAP [G47.33] 06/02/2014  . 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 Gout [M10.9] 06/02/2014  . Hypertension [I10]    Total Time spent with patient: 20 minutes   Past Medical History:  Past Medical History  Diagnosis Date  . Mood swings   . Type II or unspecified type diabetes mellitus with unspecified complication, uncontrolled 06/02/2014  . Other and unspecified hyperlipidemia 06/02/2014  . OSA on CPAP 06/02/2014  . 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 Bipolar disorder   . CHF (congestive heart failure)    . CKD (chronic kidney disease), stage III   . Diabetes mellitus without complication   . Hypertension   . Gout   . Bipolar 1 disorder   . OSA on CPAP    History reviewed. No pertinent past surgical history. Family History:  Family History  Problem Relation Age of Onset  . Dementia Mother   . Arthritis Father   . Heart failure Neg Hx   . Kidney failure Neg Hx   . Cancer Neg Hx    Social History:  History  Alcohol Use No     History  Drug Use No    Social History   Social History  . Marital Status: Married    Spouse Name: N/A  . Number of Children: N/A  . Years of Education: N/A   Social History Main Topics  . Smoking status: Never Smoker   . Smokeless tobacco: None  . Alcohol Use: No  . Drug Use: No  . Sexual Activity: Not Currently   Other Topics Concern  . None   Social History Narrative   ** Merged History Encounter **       Additional History:    Sleep: Poor  Appetite:  Poor   Assessment:   Musculoskeletal: Strength & Muscle Tone: within normal limits Gait & Station: normal Patient leans: N/A   Psychiatric Specialty Exam: Physical Exam  Nursing note and vitals reviewed.   Review of Systems  Musculoskeletal: Positive for myalgias.  All other systems reviewed and are negative.   Blood pressure 109/77, pulse 108, temperature 97.9 F (36.6 C), temperature source Oral, resp. rate 18, SpO2 97 %.There is no weight on  file to calculate BMI.  General Appearance: Casual  Eye Contact::  None  Speech:  Slow and Slurred  Volume:  Decreased  Mood:  Depressed and Hopeless  Affect:  Flat  Thought Process:  Linear  Orientation:  Full (Time, Place, and Person)  Thought Content:  Delusions and Paranoid Ideation  Suicidal Thoughts:  Yes.  with intent/plan  Homicidal Thoughts:  No  Memory:  Immediate;   Poor Recent;   Poor Remote;   Poor  Judgement:  Poor  Insight:  Lacking  Psychomotor Activity:  Decreased  Concentration:  Poor  Recall:  Poor   Fund of Knowledge:Poor  Language: Poor  Akathisia:  No  Handed:  Right  AIMS (if indicated):     Assets:  Financial Resources/Insurance Social Support  ADL's:  Intact  Cognition: WNL  Sleep:  Number of Hours: 6.75     Current Medications: Current Facility-Administered Medications  Medication Dose Route Frequency Provider Last Rate Last Dose  . acetaminophen (TYLENOL) tablet 650 mg  650 mg Oral Q6H PRN Shari Prows, MD   650 mg at 07/18/15 1934  . allopurinol (ZYLOPRIM) tablet 300 mg  300 mg Oral Daily Jolanta B Pucilowska, MD   300 mg at 07/21/15 0944  . alum & mag hydroxide-simeth (MAALOX/MYLANTA) 200-200-20 MG/5ML suspension 30 mL  30 mL Oral Q4H PRN Jolanta B Pucilowska, MD      . aspirin chewable tablet 81 mg  81 mg Oral Daily Jolanta B Pucilowska, MD   81 mg at 07/21/15 0944  . atorvastatin (LIPITOR) tablet 10 mg  10 mg Oral q1800 Jolanta B Pucilowska, MD   10 mg at 07/21/15 1747  . cholecalciferol (VITAMIN D) tablet 400 Units  400 Units Oral Daily Shari Prows, MD   400 Units at 07/21/15 0943  . colchicine tablet 0.6 mg  0.6 mg Oral Daily Alford Highland, MD   0.6 mg at 07/22/15 1610  . diphenhydrAMINE (BENADRYL) injection 50 mg  50 mg Intramuscular Q6H PRN Shari Prows, MD   50 mg at 07/16/15 1951  . feeding supplement (GLUCERNA SHAKE) (GLUCERNA SHAKE) liquid 237 mL  237 mL Oral TID WC Jolanta B Pucilowska, MD   237 mL at 07/21/15 1729  . hydrALAZINE (APRESOLINE) tablet 25 mg  25 mg Oral 4 times per day Alford Highland, MD   25 mg at 07/22/15 9604  . insulin aspart (novoLOG) injection 0-5 Units  0-5 Units Subcutaneous QHS Alford Highland, MD   0 Units at 07/20/15 2130  . insulin aspart (novoLOG) injection 0-9 Units  0-9 Units Subcutaneous TID WC Alford Highland, MD   2 Units at 07/22/15 1319  . magnesium hydroxide (MILK OF MAGNESIA) suspension 30 mL  30 mL Oral Daily PRN Shari Prows, MD   30 mL at 07/21/15 2323  . metoprolol succinate (TOPROL-XL)  24 hr tablet 200 mg  200 mg Oral Daily Jolanta B Pucilowska, MD   200 mg at 07/22/15 0939  . mirtazapine (REMERON) tablet 30 mg  30 mg Oral QHS Jolanta B Pucilowska, MD   30 mg at 07/21/15 2255  . QUEtiapine (SEROQUEL) tablet 400 mg  400 mg Oral QHS Jolanta B Pucilowska, MD   400 mg at 07/21/15 2255  . traZODone (DESYREL) tablet 25 mg  25 mg Oral TID Shari Prows, MD   25 mg at 07/22/15 5409    Lab Results:  Results for orders placed or performed during the hospital encounter of 07/16/15 (from the past 48 hour(s))  Glucose, capillary     Status: Abnormal   Collection Time: 07/20/15  4:13 PM  Result Value Ref Range   Glucose-Capillary 243 (H) 65 - 99 mg/dL  Glucose, capillary     Status: None   Collection Time: 07/20/15  9:21 PM  Result Value Ref Range   Glucose-Capillary 99 65 - 99 mg/dL  Glucose, capillary     Status: Abnormal   Collection Time: 07/21/15  7:41 AM  Result Value Ref Range   Glucose-Capillary 134 (H) 65 - 99 mg/dL  Glucose, capillary     Status: Abnormal   Collection Time: 07/21/15 11:52 AM  Result Value Ref Range   Glucose-Capillary 138 (H) 65 - 99 mg/dL    Physical Findings: AIMS: Facial and Oral Movements Muscles of Facial Expression: None, normal Lips and Perioral Area: None, normal Jaw: None, normal Tongue: None, normal,Extremity Movements Upper (arms, wrists, hands, fingers): None, normal Lower (legs, knees, ankles, toes): None, normal, Trunk Movements Neck, shoulders, hips: None, normal, Overall Severity Severity of abnormal movements (highest score from questions above): None, normal Incapacitation due to abnormal movements: None, normal Patient's awareness of abnormal movements (rate only patient's report): No Awareness, Dental Status Current problems with teeth and/or dentures?: No Does patient usually wear dentures?: No  CIWA:    COWS:     Treatment Plan Summary: Daily contact with patient to assess and evaluate symptoms and progress in  treatment and Medication management   Medical Decision Making:  Established Problem, Stable/Improving (1), Review of Psycho-Social Stressors (1), Review or order clinical lab tests (1), Review of Medication Regimen & Side Effects (2) and Review of New Medication or Change in Dosage (2)   Christian [name] is an 64 year old male with a history of bipolar illness admitted for worsening of depression, psychosis, and agitated behavior.   1. Agitation. Resolved.   2. Mood and psychosis. The patient has been maintained on Seroquel 400 mg nightly for mood stabilization and Remeron for depression. He used to take Lamictal but this was discontinued on the medical floor. The patient may be a candidate for ECT therefore I will not restart Lamictal.  3. ECT. Dr. Toni Amend spoke with the patient. Wife to help with decision about ECT.   4. Hypertension we will continue metoprolol, Norvasc, and Hydralazine along with aspirin.  5. Dyslipidemia. He is on Lipitor.  6. Diabetes. He is on glipizide with Accu-Cheks.  7. Gout. We continue allopurinol and colchicine.  8. Edema. We will continue furosemide.   9. UTI treated on medical floor hopefully resolved.   10. Diabetic foot. Wound care consult completed. Podiatry consult.  11. Disposition. To be established. He will most likely will return to home with his wife. Placement is not out of the question.      Jolanta Pucilowska 07/22/2015, 1:51 PM

## 2015-07-22 NOTE — Consult Note (Signed)
[facility] CARDIOLOGY [facility]  CARDIOLOGY CONSULT NOTE  Patient ID: Christian [name] MRN: [id number] DOB/AGE: 1951-11-12 64 y.o.  Admit date: 07/16/2015 Referring Physician clapacs Primary Physician   Primary Cardiologist   Reason for Consultation pre ect clearance  HPI: Pt with history of severe depression, sleep apnea , diabetes, ckd, who is admitted with severe drpession with psychotic features. Asked to see patient with regards to pre ect risk. Pt is a difficult historian. He does not appear to have chest pain or sob with acitivity. EKG revealed probable sinus arrythmia with tachycardia . There were no ischemic changes. Pt states he has had no cardiac problems in the past.   ROS Review of Systems - History obtained from chart review and the patient General ROS: positive for  - fatigue Respiratory ROS: no cough, shortness of breath, or wheezing Cardiovascular ROS: no chest pain or dyspnea on exertion Gastrointestinal ROS: no abdominal pain, change in bowel habits, or black or bloody stools Musculoskeletal ROS: negative Neurological ROS: no TIA or stroke symptoms   Past Medical History  Diagnosis Date  . Mood swings   . Type II or unspecified type diabetes mellitus with unspecified complication, uncontrolled 06/02/2014  . Other and unspecified hyperlipidemia 06/02/2014  . OSA on CPAP 06/02/2014  . 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 Bipolar disorder   . CHF (congestive heart failure)   . CKD (chronic kidney disease), stage III   . Diabetes mellitus without complication   . Hypertension   . Gout   . Bipolar 1 disorder   . OSA on CPAP     Family History  Problem Relation Age of Onset  . Dementia Mother   . Arthritis Father   . Heart failure Neg Hx   . Kidney failure Neg Hx   . Cancer Neg Hx     Social History   Social History  . Marital Status: Married    Spouse Name: N/A  . Number of Children: N/A  . Years  of Education: N/A   Occupational History  . Not on file.   Social History Main Topics  . Smoking status: Never Smoker   . Smokeless tobacco: Not on file  . Alcohol Use: No  . Drug Use: No  . Sexual Activity: Not Currently   Other Topics Concern  . Not on file   Social History Narrative   ** Merged History Encounter **        History reviewed. No pertinent past surgical history.   Prescriptions prior to admission  Medication Sig Dispense Refill Last Dose  . allopurinol (ZYLOPRIM) 300 MG tablet Take 1 tablet (300 mg total) by mouth daily. For gout     . amLODipine (NORVASC) 10 MG tablet Take 10 mg by mouth daily.   unknown at unknown  . aspirin EC 81 MG tablet Take 81 mg by mouth daily.   unknown at unknown  . atorvastatin (LIPITOR) 10 MG tablet Take 1 tablet (10 mg total) by mouth daily. For high cholesterol   unknown  . cholecalciferol (VITAMIN D) 400 UNITS TABS tablet Take 800 Units by mouth daily.   unknown at unknown  . clonazePAM (KLONOPIN) 0.5 MG tablet Take 0.5 mg by mouth 2 (two) times daily.     . colchicine 0.6 MG tablet Take 1 tablet (0.6 mg total) by mouth 2 (two) times daily. For Gout pain (Patient taking differently: Take 0.6 mg by mouth daily. For Gout pain)  unknown  . furosemide (LASIX) 20 MG tablet Take 20 mg by mouth daily.   unknown at unknown  . glipiZIDE (GLUCOTROL XL) 5 MG 24 hr tablet Take 5 mg by mouth daily.   unknown at unknown  . lamoTRIgine (LAMICTAL) 100 MG tablet Take 1 tablet (100 mg total) by mouth every evening. 30 tablet 0 unknown at unknown  . metoprolol (TOPROL-XL) 200 MG 24 hr tablet Take 1 tablet (200 mg total) by mouth daily. For high blood pressure   unknown  . mirtazapine (REMERON) 30 MG tablet Take 30 mg by mouth at bedtime.     Marland Kitchen QUEtiapine (SEROQUEL) 400 MG tablet Take 400 mg by mouth at bedtime.    unknown at unknown  . traZODone (DESYREL) 100 MG tablet Take 2 tablets (200 mg total) by mouth at bedtime. For sleep 30 tablet 0 unknown at  unknown  . hydrALAZINE (APRESOLINE) 50 MG tablet Take 1 tablet (50 mg total) by mouth every 6 (six) hours. 120 tablet 0   . lurasidone (LATUDA) 40 MG TABS tablet Take 40 mg by mouth every evening.   unknown at unknown    Physical Exam: Blood pressure 131/93, pulse 106, temperature 97.9 F (36.6 C), temperature source Oral, resp. rate 20, SpO2 96 %.   General appearance: appears older than stated age, distracted and uncooperative Resp: clear to auscultation bilaterally Cardio: regular rate and rhythm GI: soft, non-tender; bowel sounds normal; no masses,  no organomegaly Extremities: extremities normal, atraumatic, no cyanosis or edema Neurologic: Grossly normal Labs:   Lab Results  Component Value Date   WBC 11.1* 07/20/2015   HGB 16.0 07/20/2015   HCT 47.9 07/20/2015   MCV 89.9 07/20/2015   PLT 276 07/20/2015     Recent Labs Lab 07/20/15 0733  NA 140  K 4.4  CL 99*  CO2 27  BUN 27*  CREATININE 1.56*  CALCIUM 9.3  GLUCOSE 182*   Lab Results  Component Value Date   CKTOTAL 886* 03/24/2011   CKMB 1.8 03/07/2011   TROPONINI 0.01        NO INDICATION OF MYOCARDIAL INJURY. 03/07/2011       EKG: sinus tachycardia withsinus arryhtmia  ASSESSMENT AND PLAN:  Pt with severe depression, history of hypertension, diabetes, and dehydration with ckd. He is not eating due to his severe depression. No obvious contraintication to ect. Would proceed with routine cardiac monitioring. Not candidate for noninvasive or invasive cardiac ischemic work up at present.  Signed: Dalia Heading MD, [facility] 07/22/2015, 7:34 AM

## 2015-07-22 NOTE — Progress Notes (Signed)
D: Pt denies SI/HI. Pt is alert to self with periods of confusion to time, place and situation. Pt has a Recruitment consultant at bedside. Patient 's affect is flat, sad, he appears to be confused and restless.  A: Pt was offered support and encouragement. Pt was given scheduled medications. 15 minutes checks maintained.   R: Pt is complaint with medication. Pt is receptive to treatment and safety maintained on unit.

## 2015-07-22 NOTE — Tx Team (Signed)
Interdisciplinary Treatment Plan Update (Adult)  Date:  07/22/2015 Time Reviewed:  5:13 PM  Progress in Treatment: Attending groups: No. Participating in groups:  No. Taking medication as prescribed:  Yes. Tolerating medication:  Yes. Family/Significant othe contact made:  Yes, individual(s) contacted:  wife Patient understands diagnosis:  Yes. Discussing patient identified problems/goals with staff:  Yes. Medical problems stabilized or resolved:  Yes. Denies suicidal/homicidal ideation: No Issues/concerns per patient self-inventory:  No. Other:  New problem(s) identified: No, Describe:     Discharge Plan or Barriers:plan is for discharged home with wife after receiving ECT  Reason for Continuation of Hospitalization: Depression  Comments:Christian [name] has a long history of bipolar illness. He was recently discharged from [facility] where he was admitted for depression and suicidal ideation. On the way home and agitated and his wife brought him to the emergency room here. He was briefly admitted to psychiatry for depression and suicidal ideation with a plan to treat him with ECT. He was transferred to medical floor to treat acute UTI. Reportedly there were no behavioral problems while on the medical floor but the patient was minimally interactive with staff and mostly mute. He is UTI and dehydration once treated the patient returns to psychiatry for further treatment. When the patient learned that he started to psychiatry became agitated and required a lot of effort to calm down. Once transferred to psychiatry he became catatonic, unable to speak or move. He appeared frightened and attending to internal stimuli. He required IM injection of Haldol, Ativan and Benadryl to calm him down and relax. He is unable to provide any information. The information is obtained from the chart and his wife.[name] has a long history of bipolar illness with episodes of both severe depression and  mania. He has been stable on his medications for several years now but several weeks ago he attempted suicide. Since then he's been increasingly depressed and dysfunctional. He has been increasingly depressed with delusions of guilt. He hardly sleeps at all and mostly mumbles. He seems to remember that he was treated with lithium in the past and it was not good for him. He received ECT treatment in the past that was helpful. e. There is no heightened anxiety. There is no history of substance use.  Estimated length of stay: 7 days  New goal(s):  Review of initial/current patient goals per problem list:   See Plan of Care  Attendees: Patient:  Christian [name] 8/23/20165:13 PM  Family:   8/23/20165:13 PM  Physician:  Kristine Linea, MD 8/23/20165:13 PM  Nursing:    8/23/20165:13 PM  Case Manager:   8/23/20165:13 PM  Counselor:   8/23/20165:13 PM  Other:  Jake Shark, LCSW 8/23/20165:13 PM  Other:  Hershal Coria, LRT 8/23/20165:13 PM  Other:  Beryl Meager, LCSWA 8/23/20165:13 PM  Other:  8/23/20165:13 PM  Other:  8/23/20165:13 PM  Other:  8/23/20165:13 PM  Other:  8/23/20165:13 PM  Other:  8/23/20165:13 PM  Other:  8/23/20165:13 PM  Other:   8/23/20165:13 PM   Scribe for Treatment Team:   Glennon Mac, 07/22/2015, 5:13 PM, MSW, LCSW

## 2015-07-22 NOTE — Progress Notes (Signed)
Nutrition Follow-up     INTERVENTION:   Medical Food Supplement Therapy: continue Glucerna shakes and Mighty Shakes TID  Meals and Snacks: encourage menu completion to best meet pt preferences  NUTRITION DIAGNOSIS:   Inadequate oral intake related to lethargy/confusion, acute illness as evidenced by meal completion < 25%. Improving.   GOAL:   Patient will meet greater than or equal to 90% of their needs   MONITOR:    (Energy Intake, Anthropometrics, Digestive System, Cognition, Glucose Profile, Electrolyte/Renal Profile)   ASSESSMENT:    Pt with severe depression with psychosis, Noted plan to start ECT.    Diet Order:  Diet Carb Modified Fluid consistency:: Thin; Room service appropriate?: Yes   Energy Intake: recorded po intake 64% on average, noted pt did not eat breakfast or lunch yesterday but was fed mighty shake by MD. Pt told MD that he is not eating because he is depressed. Pt ate better at dinner last night and drank his "great shake" per nsg notes. Pt also appears to be drinking some Glucerna per documentation. Noted MD has told pt that if he does not eat he will die.   Electrolyte and Renal Profile:  Recent Labs Lab 07/19/15 1221 07/20/15 0733  BUN 29* 27*  CREATININE 1.61* 1.56*  NA 133* 140  K 4.8 4.4   Glucose Profile:  Recent Labs  07/20/15 2121 07/21/15 0741 07/21/15 1152  GLUCAP 99 134* 138*   Meds: remeron, ss novolog  Height:   Ht Readings from Last 1 Encounters:  07/12/15 6' (1.829 m)    Weight: no new weight  Wt Readings from Last 1 Encounters:  07/12/15 225 lb 8 oz (102.286 kg)    BMI:  There is no weight on file to calculate BMI.  Estimated Nutritional Needs:   Kcal:  2022-2460 kacls (25-30 kcals/kg)   Protein:  66-82 g (0.8-1.0 g/kg)   Fluid:  2022-2460 mL of fluid  LOW Care Level  Romelle Starcher MS, RD, LDN [phone] Pager

## 2015-07-22 NOTE — Progress Notes (Signed)
Patient ID: Christian [name], male   DOB: Dec 04, 1950, 64 y.o.   MRN: [id number]  [facility] PROGRESS NOTE  Lorence Nagengast Nair WUJ:[id number] DOB: Jan 19, 1951 DOA: 07/16/2015 PCP: Leanor Rubenstein, MD  HPI/Subjective: Patient picked up his head and looked at me and then closed his eyes and put his head back on the pillow. I asked him if he wants to tell me anything want here he shook his head no. I said that he has to eat a mighty shake and I proceeded to feed him a half a mighty shake again today.   Objective: Filed Vitals:   07/22/15 1314  BP: 109/77  Pulse: 108  Temp:   Resp: 18   ROS: Review of Systems  Unable to perform ROS  secondary to severe depression Exam: Physical Exam  HENT:  Nose: No mucosal edema.  Mouth/Throat: No oropharyngeal exudate or posterior oropharyngeal edema.  Eyes: Conjunctivae, EOM and lids are normal. Pupils are equal, round, and reactive to light.  Neck: No JVD present. Carotid bruit is not present. No edema present. No thyroid mass and no thyromegaly present.  Cardiovascular: S1 normal and S2 normal.  Exam reveals no gallop.   No murmur heard. Pulses:      Dorsalis pedis pulses are 2+ on the right side, and 2+ on the left side.  Respiratory: No respiratory distress. He has no wheezes. He has no rhonchi. He has no rales.  GI: Soft. Bowel sounds are normal. There is no tenderness.  Musculoskeletal:       Right ankle: He exhibits swelling.       Left ankle: He exhibits swelling.  Lymphadenopathy:    He has no cervical adenopathy.  Neurological: He is alert. No cranial nerve deficit.  Skin: Skin is warm. No rash noted. Nails show no clubbing.  Large Left medial foot blood blister with skin intact.  Psychiatric: His affect is blunt.    Data Reviewed: Basic Metabolic Panel:  Recent Labs Lab 07/19/15 1221 07/20/15 0733  NA 133* 140  K 4.8 4.4  CL 97* 99*  CO2 24 27  GLUCOSE 268* 182*  BUN 29* 27*  CREATININE 1.61* 1.56*  CALCIUM  9.0 9.3   CBC:  Recent Labs Lab 07/19/15 1221 07/20/15 0733  WBC 9.6 11.1*  NEUTROABS  --  8.8*  HGB 15.5 16.0  HCT 47.0 47.9  MCV 90.4 89.9  PLT 233 276    CBG:  Recent Labs Lab 07/19/15 0612 07/20/15 1613 07/20/15 2121 07/21/15 0741 07/21/15 1152  GLUCAP 141* 243* 99 134* 138*    Recent Results (from the past 240 hour(s))  Urine culture     Status: None   Collection Time: 07/14/15 12:13 PM  Result Value Ref Range Status   Specimen Description URINE, RANDOM  Final   Special Requests Normal  Final   Culture NO GROWTH 1 DAY  Final   Report Status 07/16/2015 FINAL  Final     Studies: Dg Chest Port 1 View  07/21/2015   CLINICAL DATA:  Heart failure with preserved ejection fraction, history CHF, hypertension, type II diabetes mellitus  EXAM: PORTABLE CHEST - 1 VIEW  COMPARISON:  Portable exam 2116 hours compared to 06/18/2014  FINDINGS: Normal heart size.  Prominent LEFT hilum question due to pulmonary artery enlargement, configuration different versus previous exam.  Pulmonary vascularity otherwise normal.  Minimal RIGHT basilar atelectasis.  No acute infiltrate, pleural effusion or pneumothorax.  IMPRESSION: Prominent LEFT hilum, question due to pulmonary arterial enlargement though  adenopathy not excluded ; followup upright PA and lateral chest radiographs recommended to further assess.   Electronically Signed   By: Ulyses Southward M.D.   On: 07/21/2015 21:48    Scheduled Meds: . allopurinol  300 mg Oral Daily  . aspirin  81 mg Oral Daily  . colchicine  0.6 mg Oral Daily  . feeding supplement (GLUCERNA SHAKE)  237 mL Oral TID WC  . insulin aspart  0-5 Units Subcutaneous QHS  . insulin aspart  0-9 Units Subcutaneous TID WC  . metoprolol succinate  200 mg Oral Daily  . mirtazapine  30 mg Oral QHS  . QUEtiapine  400 mg Oral QHS  . traZODone  25 mg Oral TID    Assessment/Plan:  1. Dehydration, acute on chronic kidney disease stage III.- I physically had to feed the  patient half of a mighty shake again today. I stressed the nursing staff and to the patient's aide in the room that they have to feed him. If they can get the head of the bed up and then feed him it would be better. The patient may end up needing to be transferred back to medicine if he doesn't eat. 2. Intact blood Blister left heel- skin intact, no further treatment. Keep covered. 3. Essential hypertension- since he is not eating I will DC hydralazine today 4. Diabetes type 2- sliding-scale only since the patient is missing meals. 5. Hyperlipidemia unspecified- hold atorvastatin 6. History of gout on allopurinol and colchicine. 7. Severe depression- psychiatry following hopefully ECT can be started soon. The patient does not start eating soon I may have to bring him back to the medical floor for NG tube feeding and IV fluids.  Code Status:     Code Status Orders        Start     Ordered   07/16/15 1213  Full code   Continuous     07/16/15 1213     Disposition Plan: To be determined  Time spent: 25 minutes  Alford Highland  [facility] Hospitalists

## 2015-07-23 ENCOUNTER — Inpatient Hospital Stay: Payer: Commercial Managed Care - HMO | Admitting: Anesthesiology

## 2015-07-23 ENCOUNTER — Inpatient Hospital Stay: Payer: Commercial Managed Care - HMO

## 2015-07-23 LAB — BASIC METABOLIC PANEL
Anion gap: 10 (ref 5–15)
BUN: 21 mg/dL — ABNORMAL HIGH (ref 6–20)
CO2: 25 mmol/L (ref 22–32)
Calcium: 8.7 mg/dL — ABNORMAL LOW (ref 8.9–10.3)
Chloride: 103 mmol/L (ref 101–111)
Creatinine, Ser: 1.27 mg/dL — ABNORMAL HIGH (ref 0.61–1.24)
GFR calc Af Amer: >60 mL/min (ref >60)
GFR calc non Af Amer: 59 mL/min — ABNORMAL LOW (ref >60)
Glucose, Bld: 227 mg/dL — ABNORMAL HIGH (ref 65–99)
Potassium: 4.5 mmol/L (ref 3.5–5.1)
Sodium: 138 mmol/L (ref 135–145)

## 2015-07-23 LAB — MAGNESIUM: Magnesium: 1.8 mg/dL (ref 1.7–2.4)

## 2015-07-23 MED ORDER — SODIUM CHLORIDE 0.9 % IV BOLUS (SEPSIS)
1000.0000 mL | Freq: Once | INTRAVENOUS | Status: AC
  Administered 2015-07-23: 1000 mL via INTRAVENOUS

## 2015-07-23 MED ORDER — LABETALOL HCL 5 MG/ML IV SOLN
20.0000 mg | Freq: Once | INTRAVENOUS | Status: AC
  Administered 2015-07-23: 20 mg via INTRAVENOUS

## 2015-07-23 MED ORDER — SUCCINYLCHOLINE CHLORIDE 20 MG/ML IJ SOLN
120.0000 mg | Freq: Once | INTRAMUSCULAR | Status: AC
  Administered 2015-07-23: 120 mg via INTRAVENOUS

## 2015-07-23 MED ORDER — FENTANYL CITRATE (PF) 100 MCG/2ML IJ SOLN: 25.0000 ug | INTRAMUSCULAR | Status: DC | PRN

## 2015-07-23 MED ORDER — ONDANSETRON HCL 4 MG/2ML IJ SOLN: 4.0000 mg | Freq: Once | INTRAMUSCULAR | Status: DC | PRN

## 2015-07-23 MED ORDER — LABETALOL HCL 5 MG/ML IV SOLN
20.0000 mg | Freq: Once | INTRAVENOUS | Status: AC
  Administered 2015-07-23: 20 mg via INTRAVENOUS
  Filled 2015-07-23: qty 4

## 2015-07-23 MED ORDER — OLANZAPINE 10 MG IM SOLR
10.0000 mg | Freq: Two times a day (BID) | INTRAMUSCULAR | Status: DC
  Administered 2015-07-24 – 2015-07-29 (×10): 10 mg via INTRAMUSCULAR
  Filled 2015-07-23 (×14): qty 10

## 2015-07-23 MED ORDER — LIDOCAINE HCL (CARDIAC) 20 MG/ML IV SOLN
4.0000 mg | Freq: Once | INTRAVENOUS | Status: AC
  Administered 2015-07-23: 4 mg via INTRAVENOUS

## 2015-07-23 MED ORDER — METHOHEXITAL SODIUM 100 MG/10ML IV SOSY
100.0000 mg | PREFILLED_SYRINGE | Freq: Once | INTRAVENOUS | Status: AC
  Administered 2015-07-23: 100 mg via INTRAVENOUS

## 2015-07-23 MED ORDER — SODIUM CHLORIDE 0.9 % IV SOLN: 250.0000 mL | Freq: Once | INTRAVENOUS | Status: DC

## 2015-07-23 MED ORDER — METOPROLOL TARTRATE 1 MG/ML IV SOLN
INTRAVENOUS | Status: DC | PRN
  Administered 2015-07-23: 3 mg via INTRAVENOUS

## 2015-07-23 NOTE — Transfer of Care (Signed)
Immediate Anesthesia Transfer of Care Note  Patient: Christian [name]  Procedure(s) Performed: ECT  Patient Location: PACU  Anesthesia Type:General  Level of Consciousness: sedated  Airway & Oxygen Therapy: Patient Spontanous Breathing  Post-op Assessment: Report given to RN  Post vital signs: Reviewed and stable  Last Vitals:  Filed Vitals:   07/23/15 1145  BP: 151/99  Pulse: 97  Temp: 37.9 C  Resp: 18    Complications: No apparent anesthesia complications

## 2015-07-23 NOTE — [facility] Group Notes (Signed)
[facility] LCSW Aftercare Discharge Planning Group Note   07/23/2015 12:55 PM  Participation Quality:  Did not attend.   Christian [name] L Christian [name] MSW, LCSWA     

## 2015-07-23 NOTE — Plan of Care (Signed)
Problem: Diagnosis: Increased Risk For Suicide Attempt Goal: LTG-Patient Will Show Positive Response to Medication LTG (by discharge) : Patient will show positive response to medication and will participate in the development of the discharge plan.  Outcome: Not Progressing Patient is not participating in his care, not complaint with medication regimen and sometimes resistant to care

## 2015-07-23 NOTE — Progress Notes (Signed)
Recreation Therapy Notes  Date: 08.24.16 Time: 3:00 pm Location: Craft Room  Group Topic: Self-esteem  Goal Area(s) Addresses:  Patient will write down at least one positive trait about self. Patient will verbalize importance of self-esteem  Behavioral Response: Did not attend  Intervention: I Am  Activity: Patients were given a worksheet with the letter I and instructed to fill it with as many positive traits about themselves as they could.  Education: LRT educated patients on ways to increase their self-esteem.   Education Outcome: Patient did not attend group.  Clinical Observations/Feedback: Patient did not attend group.  Jacquelynn Cree, LRT/CTRS 07/23/2015 5:02 PM

## 2015-07-23 NOTE — Progress Notes (Signed)
Patient is alert and aware he is in the hospital. He received ECT this morning and tolerated it well. Patient is irritable, depressed, and resistive to care. He is refusing to get out of bed, eat, or take his medications. Resistive to any teaching. Speech is limited and patient sometimes refuses to answer any questions. Will continue current treatment plan. Monitor response to ECT. Encourage patient compliance with medications, monitor intake. Maintain on 1:1 with sitter.

## 2015-07-23 NOTE — Progress Notes (Signed)
[facility] MD Progress Note  07/23/2015 9:11 PM Christian [name]  MRN:  [id number]  Subjective:  Patient is still very withdrawn. Barely ever stands up. Not eating or drinking. Describes himself as being depressed but otherwise won't give any history. Seems to be nearly catatonic. Very depressed. Not responding to most verbal intervention. He had an ECT treatment today administered as bilateral treatment. Patient had a fairly inadequate seeming seizure. Plan is to switch and aesthetic to ketamine for next treatment.  Principal Problem: Bipolar disorder, current episode depressed, severe, with psychotic features Diagnosis:   Patient Active Problem List   Diagnosis Date Noted  . Bipolar disorder, current episode depressed, severe, with psychotic features [F31.5] 07/09/2015  . Diabetes [E11.9] 07/09/2015  . UTI (urinary tract infection) [N39.0] 06/19/2014  . Positive blood culture [R78.81] 06/19/2014  . Bipolar affective disorder, current episode manic with psychotic symptoms [F31.2] 06/17/2014  . Type II or unspecified type diabetes mellitus with unspecified complication, uncontrolled [E11.8] 06/02/2014  . Other and unspecified hyperlipidemia [E78.5] 06/02/2014  . OSA on CPAP [G47.33] 06/02/2014  . 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 Gout [M10.9] 06/02/2014  . Hypertension [I10]    Total Time spent with patient: 20 minutes   Past Medical History:  Past Medical History  Diagnosis Date  . Mood swings   . Type II or unspecified type diabetes mellitus with unspecified complication, uncontrolled 06/02/2014  . Other and unspecified hyperlipidemia 06/02/2014  . OSA on CPAP 06/02/2014  . 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 Bipolar disorder   . CHF (congestive heart failure)   . CKD (chronic kidney disease), stage III   . Diabetes mellitus without complication   . Hypertension   . Gout   . Bipolar 1  disorder   . OSA on CPAP    History reviewed. No pertinent past surgical history. Family History:  Family History  Problem Relation Age of Onset  . Dementia Mother   . Arthritis Father   . Heart failure Neg Hx   . Kidney failure Neg Hx   . Cancer Neg Hx    Social History:  History  Alcohol Use No     History  Drug Use No    Social History   Social History  . Marital Status: Married    Spouse Name: N/A  . Number of Children: N/A  . Years of Education: N/A   Social History Main Topics  . Smoking status: Never Smoker   . Smokeless tobacco: None  . Alcohol Use: No  . Drug Use: No  . Sexual Activity: Not Currently   Other Topics Concern  . None   Social History Narrative   ** Merged History Encounter **       Additional History:    Sleep: Poor  Appetite:  Poor   Assessment:   Musculoskeletal: Strength & Muscle Tone: within normal limits Gait & Station: normal Patient leans: N/A   Psychiatric Specialty Exam: Physical Exam  Nursing note and vitals reviewed. Constitutional: He has a sickly appearance.  HENT:  Head: Normocephalic and atraumatic.  Eyes: Conjunctivae are normal. Pupils are equal, round, and reactive to light.  Neck: Normal range of motion.  Cardiovascular: Normal heart sounds.   Respiratory: Effort normal.  GI: Soft.  Musculoskeletal: Normal range of motion.  Neurological: He is alert.  Skin: Skin is warm and dry.     Psychiatric: His affect is blunt. He is slowed  and withdrawn. Thought content is delusional. Cognition and memory are impaired. He expresses inappropriate judgment. He exhibits a depressed mood. He is noncommunicative.    Review of Systems  Musculoskeletal: Positive for myalgias.  Psychiatric/Behavioral: Positive for depression, suicidal ideas and memory loss. Negative for hallucinations and substance abuse. The patient has insomnia. The patient is not nervous/anxious.   All other systems reviewed and are negative.     Blood pressure 147/93, pulse 102, temperature 99 F (37.2 C), temperature source Oral, resp. rate 18, height 6' (1.829 m), weight 96.072 kg (211 lb 12.8 oz), SpO2 99 %.Body mass index is 28.72 kg/(m^2).  General Appearance: Casual  Eye Contact::  None  Speech:  Slow and Slurred  Volume:  Decreased  Mood:  Depressed and Hopeless  Affect:  Flat  Thought Process:  Linear  Orientation:  Full (Time, Place, and Person)  Thought Content:  Delusions and Paranoid Ideation  Suicidal Thoughts:  Yes.  with intent/plan  Homicidal Thoughts:  No  Memory:  Immediate;   Poor Recent;   Poor Remote;   Poor  Judgement:  Poor  Insight:  Lacking  Psychomotor Activity:  Decreased  Concentration:  Poor  Recall:  Poor  Fund of Knowledge:Poor  Language: Poor  Akathisia:  No  Handed:  Right  AIMS (if indicated):     Assets:  Financial Resources/Insurance Social Support  ADL's:  Intact  Cognition: WNL  Sleep:  Number of Hours: 6.75     Current Medications: Current Facility-Administered Medications  Medication Dose Route Frequency Provider Last Rate Last Dose  . 0.9 %  sodium chloride infusion  250 mL Intravenous Once Gonzella Lex, MD   250 mL at 07/23/15 1325  . acetaminophen (TYLENOL) tablet 650 mg  650 mg Oral Q6H PRN Clovis Fredrickson, MD   650 mg at 07/23/15 1956  . allopurinol (ZYLOPRIM) tablet 300 mg  300 mg Oral Daily Jolanta B Pucilowska, MD   300 mg at 07/21/15 0944  . alum & mag hydroxide-simeth (MAALOX/MYLANTA) 200-200-20 MG/5ML suspension 30 mL  30 mL Oral Q4H PRN Jolanta B Pucilowska, MD      . aspirin chewable tablet 81 mg  81 mg Oral Daily Jolanta B Pucilowska, MD   81 mg at 07/21/15 0944  . colchicine tablet 0.6 mg  0.6 mg Oral Daily Loletha Grayer, MD   0.6 mg at 07/22/15 1191  . feeding supplement (GLUCERNA SHAKE) (GLUCERNA SHAKE) liquid 237 mL  237 mL Oral TID WC Jolanta B Pucilowska, MD   237 mL at 07/23/15 1700  . insulin aspart (novoLOG) injection 0-5 Units  0-5 Units  Subcutaneous QHS Loletha Grayer, MD   2 Units at 07/22/15 2200  . insulin aspart (novoLOG) injection 0-9 Units  0-9 Units Subcutaneous TID WC Loletha Grayer, MD   3 Units at 07/23/15 1656  . magnesium hydroxide (MILK OF MAGNESIA) suspension 30 mL  30 mL Oral Daily PRN Clovis Fredrickson, MD   30 mL at 07/21/15 2323  . metoprolol succinate (TOPROL-XL) 24 hr tablet 200 mg  200 mg Oral Daily Jolanta B Pucilowska, MD   200 mg at 07/21/15 0944  . mirtazapine (REMERON) tablet 30 mg  30 mg Oral QHS Clovis Fredrickson, MD   30 mg at 07/22/15 2201  . [START ON 07/24/2015] OLANZapine (ZYPREXA) injection 10 mg  10 mg Intramuscular BID AC John T Clapacs, MD      . ondansetron [facility]) injection 4 mg  4 mg Intravenous Once PRN Alvin Critchley,  MD      . traZODone (DESYREL) tablet 25 mg  25 mg Oral TID Clovis Fredrickson, MD   25 mg at 07/22/15 2201    Lab Results:  Results for orders placed or performed during the hospital encounter of 07/16/15 (from the past 48 hour(s))  Glucose, capillary     Status: Abnormal   Collection Time: 07/22/15  6:48 AM  Result Value Ref Range   Glucose-Capillary 173 (H) 65 - 99 mg/dL   Comment 1 Notify RN   Glucose, capillary     Status: Abnormal   Collection Time: 07/22/15  8:31 AM  Result Value Ref Range   Glucose-Capillary 186 (H) 65 - 99 mg/dL  Glucose, capillary     Status: Abnormal   Collection Time: 07/22/15 12:10 PM  Result Value Ref Range   Glucose-Capillary 157 (H) 65 - 99 mg/dL  Basic metabolic panel     Status: Abnormal   Collection Time: 07/23/15 12:49 PM  Result Value Ref Range   Sodium 138 135 - 145 mmol/L   Potassium 4.5 3.5 - 5.1 mmol/L   Chloride 103 101 - 111 mmol/L   CO2 25 22 - 32 mmol/L   Glucose, Bld 227 (H) 65 - 99 mg/dL   BUN 21 (H) 6 - 20 mg/dL   Creatinine, Ser 1.27 (H) 0.61 - 1.24 mg/dL   Calcium 8.7 (L) 8.9 - 10.3 mg/dL   GFR calc non Af Amer 59 (L) >60 mL/min   GFR calc Af Amer >60 >60 mL/min    Comment: (NOTE) The eGFR has been  calculated using the CKD EPI equation. This calculation has not been validated in all clinical situations. eGFR's persistently <60 mL/min signify possible Chronic Kidney Disease.    Anion gap 10 5 - 15  Magnesium     Status: None   Collection Time: 07/23/15 12:50 PM  Result Value Ref Range   Magnesium 1.8 1.7 - 2.4 mg/dL    Physical Findings: AIMS: Facial and Oral Movements Muscles of Facial Expression: None, normal Lips and Perioral Area: None, normal Jaw: None, normal Tongue: None, normal,Extremity Movements Upper (arms, wrists, hands, fingers): None, normal Lower (legs, knees, ankles, toes): None, normal, Trunk Movements Neck, shoulders, hips: None, normal, Overall Severity Severity of abnormal movements (highest score from questions above): None, normal Incapacitation due to abnormal movements: None, normal Patient's awareness of abnormal movements (rate only patient's report): No Awareness, Dental Status Current problems with teeth and/or dentures?: No Does patient usually wear dentures?: No  CIWA:    COWS:     Treatment Plan Summary: Daily contact with patient to assess and evaluate symptoms and progress in treatment and Medication management   Medical Decision Making:  Established Problem, Stable/Improving (1), Review of Psycho-Social Stressors (1), Review or order clinical lab tests (1), Review of Medication Regimen & Side Effects (2) and Review of New Medication or Change in Dosage (2)   Christian [name] is an 64 year old male with a history of bipolar illness admitted for worsening of depression, psychosis, and agitated behavior.   1. Agitation. Resolved.   2. Mood and psychosis. Patient is not taking his medicine. Therefore I will give him IM injections of Zyprexa twice a day. Continue with planned for 3 times a week ECT next treatment Friday. Counseling and psychoeducation with the patient. 3. ECT. Dr. Weber Cooks spoke with the patient. Wife to help with decision about ECT.    4. Hypertension we will continue metoprolol, Norvasc, and Hydralazine along with aspirin.  5. Dyslipidemia. He is on Lipitor.  6. Diabetes. He is on glipizide with Accu-Cheks.  7. Gout. We continue allopurinol and colchicine.  8. Edema. We will continue furosemide.   9. UTI treated on medical floor hopefully resolved.   10. Diabetic foot. Wound care consult completed. Podiatry consult.  11. Disposition. To be established. He will most likely will return to home with his wife. Placement is not out of the question.      [name] 07/23/2015, 9:11 PM

## 2015-07-23 NOTE — Anesthesia Procedure Notes (Signed)
Date/Time: 07/23/2015 11:31 AM Performed by: Lily Kocher Pre-anesthesia Checklist: Patient identified, Timeout performed, Emergency Drugs available, Suction available and Patient being monitored Patient Re-evaluated:Patient Re-evaluated prior to inductionOxygen Delivery Method: Circle system utilized Preoxygenation: Pre-oxygenation with 100% oxygen Intubation Type: IV induction Ventilation: Mask ventilation throughout procedure and Two handed mask ventilation required Airway Equipment and Method: Bite block Placement Confirmation: positive ETCO2 Dental Injury: Teeth and Oropharynx as per pre-operative assessment

## 2015-07-23 NOTE — Progress Notes (Signed)
Patient ID: Christian [name], male   DOB: [birth year], 64 y.o.   MRN: [id number]  [facility] Physicians PROGRESS NOTE  PCP: Leanor Rubenstein, MD  HPI/Subjective: Patient seen just after ECT treatment. He was lying in the bed. He opened his eyes and looked at me and then close them again. I asked him if he was hungry. He said, "I want a banana and creame pie and eggs and bacon".   Objective: Filed Vitals:   07/23/15 1209  BP:   Pulse:   Temp: 99.3 F (37.4 C)  Resp:    ROS: Review of Systems  Unable to perform ROS  secondary to severe depression Exam: Physical Exam  HENT:  Nose: No mucosal edema.  Mouth/Throat: No oropharyngeal exudate or posterior oropharyngeal edema.  Eyes: Conjunctivae, EOM and lids are normal. Pupils are equal, round, and reactive to light.  Neck: No JVD present. Carotid bruit is not present. No edema present. No thyroid mass and no thyromegaly present.  Cardiovascular: S1 normal and S2 normal.  Tachycardia present.  Exam reveals no gallop.   No murmur heard. Pulses:      Dorsalis pedis pulses are 2+ on the right side, and 2+ on the left side.  Respiratory: No respiratory distress. He has no wheezes. He has no rhonchi. He has no rales.  GI: Soft. Bowel sounds are normal. There is no tenderness.  Musculoskeletal:       Right ankle: He exhibits swelling.       Left ankle: He exhibits swelling.  Lymphadenopathy:    He has no cervical adenopathy.  Neurological: He is alert. No cranial nerve deficit.  Skin: Skin is warm. No rash noted. Nails show no clubbing.  Large Left medial foot blood blister with skin intact.  Psychiatric: His affect is blunt.    Data Reviewed: Basic Metabolic Panel:  Recent Labs Lab 07/19/15 1221 07/20/15 0733  NA 133* 140  K 4.8 4.4  CL 97* 99*  CO2 24 27  GLUCOSE 268* 182*  BUN 29* 27*  CREATININE 1.61* 1.56*  CALCIUM 9.0 9.3   CBC:  Recent Labs Lab 07/19/15 1221 07/20/15 0733  WBC 9.6 11.1*  NEUTROABS  --   8.8*  HGB 15.5 16.0  HCT 47.0 47.9  MCV 90.4 89.9  PLT 233 276    CBG:  Recent Labs Lab 07/21/15 0741 07/21/15 1152 07/22/15 0648 07/22/15 0831 07/22/15 1210  GLUCAP 134* 138* 173* 186* 157*    Recent Results (from the past 240 hour(s))  Urine culture     Status: None   Collection Time: 07/14/15 12:13 PM  Result Value Ref Range Status   Specimen Description URINE, RANDOM  Final   Special Requests Normal  Final   Culture NO GROWTH 1 DAY  Final   Report Status 07/16/2015 FINAL  Final     Studies: Dg Chest Port 1 View  07/21/2015   CLINICAL DATA:  Heart failure with preserved ejection fraction, history CHF, hypertension, type II diabetes mellitus  EXAM: PORTABLE CHEST - 1 VIEW  COMPARISON:  Portable exam 2116 hours compared to 06/18/2014  FINDINGS: Normal heart size.  Prominent LEFT hilum question due to pulmonary artery enlargement, configuration different versus previous exam.  Pulmonary vascularity otherwise normal.  Minimal RIGHT basilar atelectasis.  No acute infiltrate, pleural effusion or pneumothorax.  IMPRESSION: Prominent LEFT hilum, question due to pulmonary arterial enlargement though adenopathy not excluded ; followup upright PA and lateral chest radiographs recommended to further assess.   Electronically Signed  By: Ulyses Southward M.D.   On: 07/21/2015 21:48    Scheduled Meds: . sodium chloride  250 mL Intravenous Once  . allopurinol  300 mg Oral Daily  . aspirin  81 mg Oral Daily  . colchicine  0.6 mg Oral Daily  . feeding supplement (GLUCERNA SHAKE)  237 mL Oral TID WC  . insulin aspart  0-5 Units Subcutaneous QHS  . insulin aspart  0-9 Units Subcutaneous TID WC  . metoprolol succinate  200 mg Oral Daily  . mirtazapine  30 mg Oral QHS  . QUEtiapine  400 mg Oral QHS  . traZODone  25 mg Oral TID    Assessment/Plan:  1. Dehydration, acute on chronic kidney disease stage III.- Today's labs were just drawn and are pending at this point. I did give a fluid bolus  with ECT treatment. I may have to continue fluid boluses each ECT treatment until he is eating more. 2. Intact blood Blister left heel- skin intact, no further treatment. Keep covered. 3. Essential hypertension, tachycardia- continue metoprolol 4. Diabetes type 2- sliding-scale only since the patient is missing meals. 5. Hyperlipidemia unspecified- hold atorvastatin 6. History of gout on allopurinol and colchicine. 7. Severe depression- psychiatry performed first ECT treatment today. I gave a fluid bolus with the ECT treatment. The patient states he is hungry. Hopefully he will eat something today. I'm trying to avoid bringing him back to the medicine floor.  Code Status:     Code Status Orders        Start     Ordered   07/16/15 1213  Full code   Continuous     07/16/15 1213     Disposition Plan: To be determined  Time spent: 20 minutes  Alford Highland  [facility] Hospitalists

## 2015-07-23 NOTE — Procedures (Addendum)
ECT SERVICES Physician's Interval Evaluation & Treatment Note  Patient Identification: Christian [name] MRN:  [id number] Date of Evaluation:  07/23/2015 TX #: 1  MADRS: 57  MMSE: 0  P.E. Findings:  Patient is still having a ulcer that is healing on his foot. He is eating and drinking very little. No specific other new physical finding. Extremely depressed  Psychiatric Interval Note:  Patient is extremely depressed and essentially catatonic. Very little response. Eating and drinking little. Usually nonverbal.  Subjective:  Patient is a 64 y.o. male seen for evaluation for Electroconvulsive Therapy. Patient has no complaint  Treatment Summary:   []   Right Unilateral             [x]  Bilateral   % Energy : 1.0 ms 40%   Impedance: 270 ohms  Seizure Energy Index: 1411 V squared  Postictal Suppression Index: 88%  Seizure Concordance Index: 84%  Medications  Pre Shock: Metoprolol 3 mg, labetalol 20 mg, Xylocaine 4 mg, Brevital 100 mg, succinylcholine 120 mg  Post Shock:  Labetalol 20 mg  Seizure Duration: 26 seconds by EMG, 26 seconds by EEG   Comments: Poor quality seizure. Plan is to switch and aesthetic to ketamine 120 mg and off of the Brevital next time. Go to 100% stimulation Lungs:  [x]   Clear to auscultation               []  Other:   Heart:    [x]   Regular rhythm             []  irregular rhythm    [x]   Previous H&P reviewed, patient examined and there are NO CHANGES                 []   Previous H&P reviewed, patient examined and there are changes noted.   [name], MD 8/24/201611:17 AM

## 2015-07-23 NOTE — [facility] Group Notes (Signed)
[facility] Group Notes:  (Nursing/MHT/Case Management/Adjunct)  Date:  07/23/2015  Time:  1:31 PM  Type of Therapy:  Psychoeducational Skills  Participation Level:  Did Not Attend   Neriyah Cercone Lea Campbell 07/23/2015, 1:31 PM 

## 2015-07-23 NOTE — Anesthesia Postprocedure Evaluation (Signed)
  Anesthesia Post-op Note  Patient: Christian [name]  Procedure(s) Performed: * No procedures listed *  Anesthesia type:General  Patient location: PACU  Post pain: Pain level controlled  Post assessment: Post-op Vital signs reviewed, Patient's Cardiovascular Status Stable, Respiratory Function Stable, Patent Airway and No signs of Nausea or vomiting  Post vital signs: Reviewed and stable  Last Vitals:  Filed Vitals:   07/23/15 1209  BP:   Pulse:   Temp: 37.4 C  Resp:     Level of consciousness: awake, alert  and patient cooperative  Complications: No apparent anesthesia complications

## 2015-07-23 NOTE — Progress Notes (Signed)
Alert and oriented x 2 with periods of confusion to place, time and situation Affect is flat and sad, appears confused and cannot follow simple instructions. Patient is sometimes resistant to care and unwilling to participate in his treatment plan. Patient has been refusing meals all day, but during snack this evening patient was feed some applesauce and 8ounze of juice. Patient has a Recruitment consultant at  bedside, no acute distress noted, will continue to closely monitor.

## 2015-07-23 NOTE — Anesthesia Preprocedure Evaluation (Addendum)
Anesthesia Evaluation  Patient identified by MRN, date of birth, ID band Patient awake  General Assessment Comment:Patient awake , but refuses to converse  Reviewed: Allergy & Precautions, NPO status , Patient's Chart, lab work & pertinent test results  Airway Mallampati: III       Dental  (+) Poor Dentition   Pulmonary sleep apnea ,    Pulmonary exam normal       Cardiovascular hypertension, Pt. on medications +CHF Normal cardiovascular exam    Neuro/Psych Depression Bipolar Disorder    GI/Hepatic negative GI ROS, Neg liver ROS,   Endo/Other  diabetes, Well Controlled, Type 2  Renal/GU Renal InsufficiencyRenal disease     Musculoskeletal negative musculoskeletal ROS (+)   Abdominal Normal abdominal exam  (+)   Peds  Hematology negative hematology ROS (+)   Anesthesia Other Findings   Reproductive/Obstetrics                            Anesthesia Physical Anesthesia Plan  ASA: III  Anesthesia Plan: General   Post-op Pain Management:    Induction: Intravenous  Airway Management Planned: Mask  Additional Equipment:   Intra-op Plan:   Post-operative Plan:   Informed Consent: I have reviewed the patients History and Physical, chart, labs and discussed the procedure including the risks, benefits and alternatives for the proposed anesthesia with the patient or authorized representative who has indicated his/her understanding and acceptance.   Dental advisory given  Plan Discussed with: CRNA and Surgeon  Anesthesia Plan Comments:         Anesthesia Quick Evaluation

## 2015-07-24 MED ORDER — SODIUM CHLORIDE 0.9 % IV BOLUS (SEPSIS): 1000.0000 mL | Freq: Once | INTRAVENOUS | Status: DC

## 2015-07-24 NOTE — Progress Notes (Signed)
Recreation Therapy Notes  Date: 08.25.16 Time: 3:00 pm Location: Craft Room  Group Topic: Leisure Education  Goal Area(s) Addresses:  Patient will identify things they are grateful for. Patient will identify how being grateful can influence decision making.  Behavioral Response: Did not attend  Intervention: Grateful Wheel  Activity: Patients were given a I Am Grateful For worksheet and instructed to list specific things they are grateful for under each category.   Education: LRT educated patients on why it is important to be grateful.  Education Outcome: Patient did not attend group.   Clinical Observations/Feedback: Patient did not attend group.  Jacquelynn Cree, LRT/CTRS 07/24/2015 4:12 PM

## 2015-07-24 NOTE — Progress Notes (Signed)
Patient ID: Christian [name], male   DOB: 13-Sep-1951, 64 y.o.   MRN: [id number]  [facility] Physicians PROGRESS NOTE  PCP: Christian Rubenstein, MD  HPI/Subjective: Patient awakened. He said " hi doc". I asked him if he was hungry. I told him that yesterday he said he wondered the eat a banana. I held about the banana for him he unpeeled it and took a bite. Half of the banana was eaten in the 1 bite. I opened up a mighty shake and proceeded to feed him half a mighty shake.   Objective: Filed Vitals:   07/24/15 0632  BP: 144/95  Pulse: 118  Temp: 98.1 F (36.7 C)  Resp: 20   ROS: Review of Systems  Unable to perform ROS  secondary to severe depression Exam: Physical Exam  HENT:  Nose: No mucosal edema.  Mouth/Throat: No oropharyngeal exudate or posterior oropharyngeal edema.  Eyes: Conjunctivae, EOM and lids are normal. Pupils are equal, round, and reactive to light.  Neck: No JVD present. Carotid bruit is not present. No edema present. No thyroid mass and no thyromegaly present.  Cardiovascular: S1 normal and S2 normal.  Tachycardia present.  Exam reveals no gallop.   No murmur heard. Pulses:      Dorsalis pedis pulses are 2+ on the right side, and 2+ on the left side.  Respiratory: No respiratory distress. He has no wheezes. He has no rhonchi. He has no rales.  GI: Soft. Bowel sounds are normal. There is no tenderness.  Musculoskeletal:       Right ankle: He exhibits swelling.       Left ankle: He exhibits swelling.  Lymphadenopathy:    He has no cervical adenopathy.  Neurological: He is alert. No cranial nerve deficit.  Skin: Skin is warm. No rash noted. Nails show no clubbing.  Large Left medial foot blood blister with skin intact.  Psychiatric: His affect is blunt.    Data Reviewed: Basic Metabolic Panel:  Recent Labs Lab 07/19/15 1221 07/20/15 0733 07/23/15 1249 07/23/15 1250  NA 133* 140 138  --   K 4.8 4.4 4.5  --   CL 97* 99* 103  --   CO2 24 27 25   --    GLUCOSE 268* 182* 227*  --   BUN 29* 27* 21*  --   CREATININE 1.61* 1.56* 1.27*  --   CALCIUM 9.0 9.3 8.7*  --   MG  --   --   --  1.8   CBC:  Recent Labs Lab 07/19/15 1221 07/20/15 0733  WBC 9.6 11.1*  NEUTROABS  --  8.8*  HGB 15.5 16.0  HCT 47.0 47.9  MCV 90.4 89.9  PLT 233 276    Scheduled Meds: . sodium chloride  250 mL Intravenous Once  . allopurinol  300 mg Oral Daily  . aspirin  81 mg Oral Daily  . colchicine  0.6 mg Oral Daily  . feeding supplement (GLUCERNA SHAKE)  237 mL Oral TID WC  . insulin aspart  0-5 Units Subcutaneous QHS  . insulin aspart  0-9 Units Subcutaneous TID WC  . metoprolol succinate  200 mg Oral Daily  . mirtazapine  30 mg Oral QHS  . OLANZapine  10 mg Intramuscular BID AC  . [START ON 07/25/2015] sodium chloride  1,000 mL Intravenous Once  . traZODone  25 mg Oral TID    Assessment/Plan:  1. Dehydration, acute on chronic kidney disease stage III.- Patient did eat half a banana for me  and drank half a mighty shake for me. I encourage nursing staff to feed him. I will continue to give a fluid bolus of normal saline with each ECT treatment. 2. Intact blood Blister left heel- skin intact, no further treatment. Keep covered. 3. Essential hypertension, tachycardia- continue metoprolol 4. Diabetes type 2- sliding-scale only since the patient is missing meals and refusing fingersticks. 5. Hyperlipidemia unspecified- hold atorvastatin 6. History of gout on allopurinol and colchicine. 7. Severe depression- psychiatry managing with medications and ECT. Patient is still not eating much at all. Overall prognosis is poor if he does not eat on his own.  Code Status:     Code Status Orders        Start     Ordered   07/16/15 1213  Full code   Continuous     07/16/15 1213     Disposition Plan: To be determined  Time spent: 20 minutes  Christian [name]  [facility] Hospitalists

## 2015-07-24 NOTE — Tx Team (Signed)
Interdisciplinary Treatment Plan Update (Adult)  Date:  07/24/2015 Time Reviewed:  3:55 PM  Progress in Treatment: Attending groups: No. Participating in groups:  No. Taking medication as prescribed:  Yes. Tolerating medication:  Yes. Family/Significant othe contact made:  Yes, individual(s) contacted:  Wife Patient understands diagnosis:  No. limited insight  Discussing patient identified problems/goals with staff:  Yes. Medical problems stabilized or resolved:  Yes. Denies suicidal/homicidal ideation: Yes. Issues/concerns per patient self-inventory:  No. Other:  New problem(s) identified: No, Describe:  [name]   Discharge Plan or Barriers:  Reason for Continuation of Hospitalization: Depression Medication stabilization Other; describe ECT  Comments: Patient is still very withdrawn. Barely ever stands up. Not eating or drinking. Describes himself as being depressed but otherwise won't give any history. Seems to be nearly catatonic. Very depressed. Not responding to most verbal intervention. He had an ECT treatment today administered as bilateral treatment. Patient had a fairly inadequate seeming seizure. Plan is to switch and aesthetic to ketamine for next treatment.   Estimated length of stay: 7 days   New goal(s): NA   Review of initial/current patient goals per problem list:   1.  Goal(s): Patient will participate in aftercare plan * Met:  * Target date: at discharge * As evidenced by: Patient will participate within aftercare plan AEB aftercare provider and housing plan at discharge being identified.   2.  Goal (s): Patient will exhibit decreased depressive symptoms and suicidal ideations. * Met:  *  Target date: at discharge * As evidenced by: Patient will utilize self rating of depression at 3 or below and demonstrate decreased signs of depression or be deemed stable for discharge by MD.  Attendees: Patient:  Christian [name] 8/25/20163:55 PM  Family:    8/25/20163:55 PM  Physician:  Dr. Weber Cooks  8/25/20163:55 PM  Nursing:   Elige Radon, RN  8/25/20163:55 PM  Clinical Social Worker: Wray Kearns , [place]   8/25/20163:55 PM  Counselor:   8/25/20163:55 PM  Other:  Everitt Amber, LRT  8/25/20163:55 PM  Other:   8/25/20163:55 PM  Other:   8/25/20163:55 PM  Other:  8/25/20163:55 PM  Other:  8/25/20163:55 PM  Other:  8/25/20163:55 PM  Other:  8/25/20163:55 PM  Other:  8/25/20163:55 PM  Other:  8/25/20163:55 PM  Other:   8/25/20163:55 PM   Scribe for Treatment Team:   Wray Kearns, MSW, LCSWA  07/24/2015, 3:55 PM

## 2015-07-24 NOTE — Progress Notes (Signed)
Remains on 1:1-Arms Length for safety, observed in bed naked, confused, A&O to self, turn from side to side, CBG=285 and received 3 Units of insulin aspart per SSI; compliant with bedtime medications as ordered, paranoid but he has not been talking or obsessing about "back to the future", no delusional statements made thus far; patient will NPO after MN for ECT procedure in am.

## 2015-07-24 NOTE — Plan of Care (Signed)
Problem: Diagnosis: Increased Risk For Suicide Attempt Goal: LTG-Patient Will Show Positive Response to Medication LTG (by discharge) : Patient will show positive response to medication and will participate in the development of the discharge plan.  Outcome: Not Progressing Continues to be depressed, poor appetite and reluctant to talk with staff.

## 2015-07-24 NOTE — [facility] Group Notes (Signed)
[facility] Group Notes:  (Nursing/MHT/Case Management/Adjunct)  Date:  07/24/2015  Time:  8:42 AM  Type of Therapy:  Group Therapy  Participation Level:  Did Not Attend  Summary of Progress/Problems:  Christian [name] Christian [name] 07/24/2015, 8:42 AM

## 2015-07-24 NOTE — Progress Notes (Signed)
1:1 for safety.  Patient appetite is poor.  Refused am po medications.  Took evening po medications.  Started to talk more as day progressed. When given even IM injection patient states "That hurts you better stop"

## 2015-07-24 NOTE — [facility] Group Notes (Signed)
[facility] LCSW Group Therapy  07/24/2015 4:36 PM  Type of Therapy:  Group Therapy  Participation Level:  Did Not Attend  Modes of Intervention:  Discussion, Education, Socialization and Support  Summary of Progress/Problems: Balance in life: Patients will discuss the concept of balance and how it looks and feels to be unbalanced. Pt will identify areas in their life that is unbalanced and ways to become more balanced.    Daisy Floro Isamu Trammel MSW, LCSWA  07/24/2015, 4:36 PM

## 2015-07-25 ENCOUNTER — Inpatient Hospital Stay: Payer: Commercial Managed Care - HMO | Admitting: Anesthesiology

## 2015-07-25 ENCOUNTER — Inpatient Hospital Stay: Payer: Commercial Managed Care - HMO

## 2015-07-25 ENCOUNTER — Other Ambulatory Visit: Payer: Self-pay

## 2015-07-25 DIAGNOSIS — F315 Bipolar disorder, current episode depressed, severe, with psychotic features: Secondary | ICD-10-CM | POA: Diagnosis not present

## 2015-07-25 DIAGNOSIS — I509 Heart failure, unspecified: Secondary | ICD-10-CM | POA: Diagnosis not present

## 2015-07-25 DIAGNOSIS — E119 Type 2 diabetes mellitus without complications: Secondary | ICD-10-CM | POA: Diagnosis not present

## 2015-07-25 LAB — GLUCOSE, CAPILLARY
Glucose-Capillary: 162 mg/dL — ABNORMAL HIGH (ref 65–99)
Glucose-Capillary: 193 mg/dL — ABNORMAL HIGH (ref 65–99)
Glucose-Capillary: 199 mg/dL — ABNORMAL HIGH (ref 65–99)
Glucose-Capillary: 195 mg/dL — ABNORMAL HIGH (ref 65–99)
Glucose-Capillary: 215 mg/dL — ABNORMAL HIGH (ref 65–99)
Glucose-Capillary: 230 mg/dL — ABNORMAL HIGH (ref 65–99)

## 2015-07-25 MED ORDER — LEVOFLOXACIN 750 MG PO TABS
750.0000 mg | ORAL_TABLET | Freq: Every day | ORAL | Status: DC
Start: 2015-07-25 — End: 2015-07-28
  Administered 2015-07-26 – 2015-07-28 (×3): 750 mg via ORAL
  Filled 2015-07-25 (×4): qty 1

## 2015-07-25 MED ORDER — LIDOCAINE HCL (CARDIAC) 20 MG/ML IV SOLN
4.0000 mg | Freq: Once | INTRAVENOUS | Status: AC
  Administered 2015-07-25: 4 mg via INTRAVENOUS

## 2015-07-25 MED ORDER — DILTIAZEM HCL ER COATED BEADS 120 MG PO CP24
120.0000 mg | ORAL_CAPSULE | Freq: Every day | ORAL | Status: DC
  Administered 2015-07-26 – 2015-07-29 (×3): 120 mg via ORAL
  Filled 2015-07-25 (×5): qty 1

## 2015-07-25 MED ORDER — HYDRALAZINE HCL 25 MG PO TABS
25.0000 mg | ORAL_TABLET | Freq: Four times a day (QID) | ORAL | Status: DC
  Administered 2015-07-25 – 2015-07-29 (×9): 25 mg via ORAL
  Filled 2015-07-25 (×11): qty 1

## 2015-07-25 MED ORDER — SODIUM CHLORIDE 0.9 % IV SOLN
250.0000 mL | Freq: Once | INTRAVENOUS | Status: AC
  Administered 2015-07-25: 250 mL via INTRAVENOUS

## 2015-07-25 MED ORDER — LABETALOL HCL 5 MG/ML IV SOLN
20.0000 mg | Freq: Once | INTRAVENOUS | Status: AC
  Administered 2015-07-25: 20 mg via INTRAVENOUS
  Filled 2015-07-25: qty 4

## 2015-07-25 MED ORDER — SUCCINYLCHOLINE CHLORIDE 20 MG/ML IJ SOLN
120.0000 mg | Freq: Once | INTRAMUSCULAR | Status: AC
  Administered 2015-07-25: 120 mg via INTRAVENOUS

## 2015-07-25 MED ORDER — NITROGLYCERIN 0.2 MG/ML ON CALL CATH LAB
100.0000 ug | Freq: Once | INTRAVENOUS | Status: AC
  Administered 2015-07-25: 100 ug via INTRAVENOUS

## 2015-07-25 MED ORDER — LABETALOL HCL 5 MG/ML IV SOLN
20.0000 mg | Freq: Once | INTRAVENOUS | Status: AC
  Administered 2015-07-25: 20 mg via INTRAVENOUS

## 2015-07-25 MED ORDER — KETAMINE HCL 10 MG/ML IJ SOLN
120.0000 mg | Freq: Once | INTRAMUSCULAR | Status: AC
  Administered 2015-07-25: 120 mg via INTRAVENOUS

## 2015-07-25 NOTE — [facility] Group Notes (Signed)
[facility] LCSW Group Therapy  07/25/2015 4:09 PM  Type of Therapy:  Group Therapy  Participation Level:  Did Not Attend  Lulu Riding, MSW, LCSWA 07/25/2015, 4:09 PM

## 2015-07-25 NOTE — Progress Notes (Signed)
Recreation Therapy Notes  Date: 08.26.16 Time: 3:00 pm Location: Craft Room  Group Topic: Problem solving, teamwork, communication  Goal Area(s) Addresses:  Patient will effectively work with peers towards shared goal. Patient will identify skills used to make activity successful. Patient will identify benefit of using group skills effectively post d/c.  Behavioral Response: Did not attend  Intervention: [employer]  Activity: Patients were given 15 pipe cleaners and were instructed to build a free standing tower. After about 5 minutes, patients were instructed to put their dominant hand behind their back. After 5 more minutes, patients were instructed not to talk to each other.  Education: LRT educated patients on communication, problem solving, teamwork, and healthy support systems.  Education Outcome: Patient did not attend group.  Clinical Observations/Feedback: Patient did not attend group.  Jacquelynn Cree, LRT/CTRS 07/25/2015 4:21 PM

## 2015-07-25 NOTE — OR Nursing (Signed)
Patient with elevated diastolic pressures, Dr. Teresa Pelton contacted about this and decision was made not to treat at this time, just to evaluate.  Patient received labetolol and NTG in ECT.

## 2015-07-25 NOTE — Progress Notes (Signed)
Patient in bed sleeping, sitter at bedside. RN will continue to assess. Safety maintained in room.

## 2015-07-25 NOTE — Transfer of Care (Signed)
Immediate Anesthesia Transfer of Care Note  Patient: Christian [name]  Procedure(s) Performed: * No procedures listed *  Patient Location: PACU  Anesthesia Type:General  Level of Consciousness: awake, alert , oriented and patient cooperative  Airway & Oxygen Therapy: Patient Spontanous Breathing and Patient connected to face mask oxygen  Post-op Assessment: Report given to RN, Post -op Vital signs reviewed and stable and Patient moving all extremities X 4  Post vital signs: Reviewed and stable  Last Vitals:  Filed Vitals:   07/25/15 1125  BP: 122/94  Pulse: 103  Temp:   Resp: 27    Complications: No apparent anesthesia complications

## 2015-07-25 NOTE — Progress Notes (Signed)
Patient ID: Christian [name], male   DOB: Jul 18, 1951, 64 y.o.   MRN: [id number]  [facility] Physicians PROGRESS NOTE  PCP: Christian Rubenstein, MD  HPI/Subjective:   Patient said yes to all review of systems included chest pain, abdominal pain, shortness of breath, cough, diarrhea and nausea vomiting. As per the aide at the bedside this is not true. Patient had a fever of 101 after ECT treatment. Difficult to get temperature prior to ECT treatment. As per Dr. Toni Amend, he lost about a liter of urine with the ECT treatment.   Objective: Filed Vitals:   07/25/15 1300  BP: 130/85  Pulse: 101  Temp: 100 F (37.8 C)  Resp:    ROS: Review of Systems  Unable to perform ROS  secondary to severe depression Exam: Physical Exam  HENT:  Nose: No mucosal edema.  Mouth/Throat: No oropharyngeal exudate or posterior oropharyngeal edema.  Eyes: Conjunctivae, EOM and lids are normal. Pupils are equal, round, and reactive to light.  Neck: No JVD present. Carotid bruit is not present. No edema present. No thyroid mass and no thyromegaly present.  Cardiovascular: S1 normal and S2 normal.  Tachycardia present.  Exam reveals no gallop.   No murmur heard. Pulses:      Dorsalis pedis pulses are 2+ on the right side, and 2+ on the left side.  Respiratory: No respiratory distress. He has no wheezes. He has no rhonchi. He has no rales.  GI: Soft. Bowel sounds are normal. There is no tenderness.  Musculoskeletal:       Right ankle: He exhibits swelling.       Left ankle: He exhibits swelling.  Lymphadenopathy:    He has no cervical adenopathy.  Neurological: He is alert. No cranial nerve deficit.  Skin: Skin is warm. No rash noted. Nails show no clubbing.  Large Left medial foot blood blister with skin intact.  Psychiatric: His affect is blunt.    Data Reviewed: Basic Metabolic Panel:  Recent Labs Lab 07/19/15 1221 07/20/15 0733 07/23/15 1249 07/23/15 1250  NA 133* 140 138  --   K 4.8 4.4 4.5   --   CL 97* 99* 103  --   CO2 24 27 25   --   GLUCOSE 268* 182* 227*  --   BUN 29* 27* 21*  --   CREATININE 1.61* 1.56* 1.27*  --   CALCIUM 9.0 9.3 8.7*  --   MG  --   --   --  1.8   CBC:  Recent Labs Lab 07/19/15 1221 07/20/15 0733  WBC 9.6 11.1*  NEUTROABS  --  8.8*  HGB 15.5 16.0  HCT 47.0 47.9  MCV 90.4 89.9  PLT 233 276    Scheduled Meds: . sodium chloride  250 mL Intravenous Once  . allopurinol  300 mg Oral Daily  . aspirin  81 mg Oral Daily  . colchicine  0.6 mg Oral Daily  . diltiazem  120 mg Oral Daily  . feeding supplement (GLUCERNA SHAKE)  237 mL Oral TID WC  . hydrALAZINE  25 mg Oral 4 times per day  . insulin aspart  0-5 Units Subcutaneous QHS  . insulin aspart  0-9 Units Subcutaneous TID WC  . levofloxacin  750 mg Oral Daily  . metoprolol succinate  200 mg Oral Daily  . mirtazapine  30 mg Oral QHS  . OLANZapine  10 mg Intramuscular BID AC  . sodium chloride  1,000 mL Intravenous Once  . traZODone  25 mg  Oral TID    Assessment/Plan:  1. Fever- so far unclear etiology. Sometimes when we dehydrated this can happen. I spoke with Dr. Toni Amend psychiatry and he stated that fever can happen but not usual after ECT. I will do a fever workup with blood cultures 2 urinalysis urine culture and chest x-ray. Empirically start high-dose Levaquin. 2. Dehydration, acute on chronic kidney disease stage III.- I have been hydrating with ECT treatments 1 L. Check a BMP. 3. Intact blood Blister left heel- skin intact, no further treatment. Keep covered. 4. Essential hypertension, tachycardia- continue metoprolol, added Cardizem CD this a.m. and restarted hydralazine. 4. Diabetes type 2- sliding-scale only since the patient is missing meals and refusing fingersticks. 5. Hyperlipidemia unspecified- hold atorvastatin 6. History of gout on allopurinol and colchicine. 7. Severe depression- psychiatry managing with medications and ECT. Patient status post second ECT treatment  today. Patient with poor appetite.  Code Status:     Code Status Orders        Start     Ordered   07/16/15 1213  Full code   Continuous     07/16/15 1213     Disposition Plan: To be determined  Time spent: 20 minutes  Alford Highland  [facility] Hospitalists

## 2015-07-25 NOTE — Anesthesia Postprocedure Evaluation (Signed)
  Anesthesia Post-op Note  Patient: Christian [name]  Procedure(s) Performed: * No procedures listed *  Anesthesia type:General  Patient location: PACU  Post pain: Pain level controlled  Post assessment: Post-op Vital signs reviewed, Patient's Cardiovascular Status Stable, Respiratory Function Stable, Patent Airway and No signs of Nausea or vomiting  Post vital signs: Reviewed and stable  Last Vitals:  Filed Vitals:   07/25/15 1125  BP: 122/94  Pulse: 103  Temp:   Resp: 27    Level of consciousness: awake, alert  and patient cooperative  Complications: No apparent anesthesia complications

## 2015-07-25 NOTE — Procedures (Addendum)
ECT SERVICES Physician's Interval Evaluation & Treatment Note  Patient Identification: Christian [name] MRN:  [id number] Date of Evaluation:  07/25/2015 TX #: 2  MADRS:   MMSE:   P.E. Findings:  Patient is more awake and alert  Psychiatric Interval Note:  Still confused energy level improved  Subjective:  Patient is a 64 y.o. male seen for evaluation for Electroconvulsive Therapy. Patient has no coherent complaint  Treatment Summary:   []   Right Unilateral             [x]  Bilateral   % Energy : 1.0 ms 100%   Impedance: 540 ohms  Seizure Energy Index: 1887 V squared  Postictal Suppression Index: 87%  Seizure Concordance Index: 98%  Medications  Pre Shock: Labetalol 20 mg, Xylocaine 4 mg, ketamine 120 mg, succinylcholine 120 mg  Post Shock: Labetalol 20 mg, nitroglycerin 100 g   Seizure Duration: 42 mg by EMG, 87 mg by EEG   Comments: Patient had a large amount of urination after treatment. Recommend we try to make sure he either goes to the bathroom or gets catheterized before subsequent treatments. Next treatment Monday. No change to medication except to make sure we do include labetalol as a pretreatment.  Lungs:  [x]   Clear to auscultation               []  Other:   Heart:    [x]   Regular rhythm             []  irregular rhythm    [x]   Previous H&P reviewed, patient examined and there are NO CHANGES                 []   Previous H&P reviewed, patient examined and there are changes noted.   [name], MD 8/26/201610:50 AM

## 2015-07-25 NOTE — Plan of Care (Signed)
Problem: Ineffective individual coping Goal: STG: Patient will remain free from self harm Outcome: Progressing Patient has remained free of harm, free of falls, free of injury and continues to be monitored on 1:1 Arms Length for safety. Still confused and delusional/paranoid, but not has severe as when he was first admitted. Will continue to encourage compliance with treatment and medications. Patient is not a group-appropriate candidate at this time.

## 2015-07-25 NOTE — [facility] Group Notes (Signed)
[facility] LCSW Aftercare Discharge Planning Group Note   07/25/2015 11:42 AM  Participation Quality:  Did not attend.    Ela Moffat L Bernd Crom MSW, [address]

## 2015-07-25 NOTE — Progress Notes (Addendum)
Sleep hours total = 5 hours 45 minutes, pre-med for ECT - TOPROL-XL 24 hr tablet 200 mg

## 2015-07-25 NOTE — Progress Notes (Addendum)
[facility] MD Progress Note  07/25/2015 8:24 PM Christian [name]  MRN:  [id number]  Subjective:  Follow-up note as of Friday the 26th. Patient continues to be fairly uncommunicative. This morning prior to ECT treatment he told us that he was already dead. Occasionally he will complain of aches and pains but mostly stays quiet and withdrawn. He does still describes himself occasionally as being depressed. Continues to eat and drink very little.  Principal Problem: Bipolar disorder, current episode depressed, severe, with psychotic features Diagnosis:   Patient Active Problem List   Diagnosis Date Noted  . Bipolar disorder, current episode depressed, severe, with psychotic features [F31.5] 07/09/2015  . Diabetes [E11.9] 07/09/2015  . UTI (urinary tract infection) [N39.0] 06/19/2014  . Positive blood culture [R78.81] 06/19/2014  . Bipolar affective disorder, current episode manic with psychotic symptoms [F31.2] 06/17/2014  . Type II or unspecified type diabetes mellitus with unspecified complication, uncontrolled [E11.8] 06/02/2014  . Other and unspecified hyperlipidemia [E78.5] 06/02/2014  . OSA on CPAP [G47.33] 06/02/2014  . 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 Gout [M10.9] 06/02/2014  . Hypertension [I10]    Total Time spent with patient: 20 minutes   Past Medical History:  Past Medical History  Diagnosis Date  . Mood swings   . Type II or unspecified type diabetes mellitus with unspecified complication, uncontrolled 06/02/2014  . Other and unspecified hyperlipidemia 06/02/2014  . OSA on CPAP 06/02/2014  . 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 Bipolar disorder   . CHF (congestive heart failure)   . CKD (chronic kidney disease), stage III   . Diabetes mellitus without complication   . Hypertension   . Gout   . Bipolar 1 disorder   . OSA on CPAP    History reviewed. No pertinent past surgical  history. Family History:  Family History  Problem Relation Age of Onset  . Dementia Mother   . Arthritis Father   . Heart failure Neg Hx   . Kidney failure Neg Hx   . Cancer Neg Hx    Social History:  History  Alcohol Use No     History  Drug Use No    Social History   Social History  . Marital Status: Married    Spouse Name: N/A  . Number of Children: N/A  . Years of Education: N/A   Social History Main Topics  . Smoking status: Never Smoker   . Smokeless tobacco: None  . Alcohol Use: No  . Drug Use: No  . Sexual Activity: Not Currently   Other Topics Concern  . None   Social History Narrative   ** Merged History Encounter **       Additional History:    Sleep: Poor  Appetite:  Poor   Assessment: 64 year old man with bipolar disorder type I severe currently with psychotic depression. Patient has been started on ECT treatment in addition to medication management to try and resolve this severe episode life-threatening depression. Has not tolerated to treatments. Today's treatment had a much more effective appearing seizure than that done on Wednesday thanks to change in an aesthetic procedure. Patient himself this evening however seems little improved.  Patient continues to eat and drink very little and need a lot of encouragement. Doesn't get out of bed.  New concern raised today because of low grade fever in the afternoon after ECT treatment. Hospitalist consult on the patient. Concern was raised by the  medical review or concerning the possibility of serotonin syndrome.  I have examined the patient both in terms of physical exam and mental status exam today after the treatment as well as before the treatment. Fever did not go higher than 101 and seemed to be coming down by the time I saw him. Patient had no specific complaints did not complain of any shortness of breath or cough. He had a large urination during ECT treatment today but his last UA was normal and he  does not appear to be having obvious new symptoms of a urinary tract infection. Hospitalist is proceeding with workup. Labs pending. Chest x-ray could not be obtained today because of his resistance to cooperation. I have discussed this with the nursing staff and hope that after he gets evening medicines we can try again to get a chest x-ray which I think really will be quite helpful.  REGARDING CONCERN ABOUT SEROTONIN SYNDROME Although patient did have a low-grade fever and continues to be frequently tachycardic this tachycardia and intermittent changes in blood pressure proceeded any fever. On physical exam the patient this evening is loose and not stiff. There is no tremor and no clonus. Deep tendon reflexes are if anything still less than usual and certainly not hypertonic.  Current medications are extremely low risk for serotonin syndrome. Antidepressive treatment with mirtazapine relatively low in terms of serotonergic potential. Zyprexa very low as far as serotonergic potential and in fact is sometimes used as a treatment for serotonin syndrome. No other new medications which would put him at increased risk.  Given the lack of likely medications to cause serotonin syndrome and more important the lack of cardinal symptoms of serotonin syndrome other than his low-grade fever I think that is very low on the differential. I'm much more concerned about the possibility of a pneumonia or other infection although the increased temperature could be simply a side effect of the ECT itself.  Musculoskeletal: Strength & Muscle Tone: within normal limits Gait & Station: normal Patient leans: N/A   Psychiatric Specialty Exam: Physical Exam  Nursing note and vitals reviewed. Constitutional: He appears lethargic. He has a sickly appearance.  HENT:  Head: Normocephalic and atraumatic.  Eyes: Conjunctivae are normal. Pupils are equal, round, and reactive to light.  Neck: Normal range of motion.   Cardiovascular: Normal heart sounds.   Respiratory: Effort normal.  GI: Soft.  Musculoskeletal: Normal range of motion.  Neurological: He appears lethargic.  Skin: Skin is warm and dry.     Psychiatric: His affect is blunt. He is slowed and withdrawn. Thought content is delusional. Cognition and memory are impaired. He expresses inappropriate judgment. He exhibits a depressed mood. He is noncommunicative.    Review of Systems  Musculoskeletal: Positive for myalgias.  Psychiatric/Behavioral: Positive for depression, suicidal ideas and memory loss. Negative for hallucinations and substance abuse. The patient has insomnia. The patient is not nervous/anxious.   All other systems reviewed and are negative.   Blood pressure 130/85, pulse 101, temperature 100 F (37.8 C), temperature source Oral, resp. rate 27, height 6' (1.829 m), weight 99.519 kg (219 lb 6.4 oz), SpO2 95 %.Body mass index is 29.75 kg/(m^2).  General Appearance: Casual  Eye Contact::  None  Speech:  Slow and Slurred  Volume:  Decreased  Mood:  Depressed and Hopeless  Affect:  Flat  Thought Process:  Linear  Orientation:  Full (Time, Place, and Person)  Thought Content:  Delusions and Paranoid Ideation  Suicidal Thoughts:  Yes.  with intent/plan  Homicidal Thoughts:  No  Memory:  Immediate;   Poor Recent;   Poor Remote;   Poor  Judgement:  Poor  Insight:  Lacking  Psychomotor Activity:  Decreased  Concentration:  Poor  Recall:  Poor  Fund of Knowledge:Poor  Language: Poor  Akathisia:  No  Handed:  Right  AIMS (if indicated):     Assets:  Financial Resources/Insurance Social Support  ADL's:  Intact  Cognition: WNL  Sleep:  Number of Hours: 5.45     Current Medications: Current Facility-Administered Medications  Medication Dose Route Frequency Provider Last Rate Last Dose  . 0.9 %  sodium chloride infusion  250 mL Intravenous Once Audery Amel, MD   250 mL at 07/23/15 1325  . acetaminophen (TYLENOL)  tablet 650 mg  650 mg Oral Q6H PRN Shari Prows, MD   650 mg at 07/23/15 1956  . allopurinol (ZYLOPRIM) tablet 300 mg  300 mg Oral Daily Jolanta B Pucilowska, MD   300 mg at 07/25/15 1400  . alum & mag hydroxide-simeth (MAALOX/MYLANTA) 200-200-20 MG/5ML suspension 30 mL  30 mL Oral Q4H PRN Jolanta B Pucilowska, MD      . aspirin chewable tablet 81 mg  81 mg Oral Daily Jolanta B Pucilowska, MD   81 mg at 07/25/15 1400  . colchicine tablet 0.6 mg  0.6 mg Oral Daily Alford Highland, MD   0.6 mg at 07/25/15 1402  . diltiazem (CARDIZEM CD) 24 hr capsule 120 mg  120 mg Oral Daily Alford Highland, MD   120 mg at 07/25/15 1418  . feeding supplement (GLUCERNA SHAKE) (GLUCERNA SHAKE) liquid 237 mL  237 mL Oral TID WC Jolanta B Pucilowska, MD   237 mL at 07/23/15 1700  . hydrALAZINE (APRESOLINE) tablet 25 mg  25 mg Oral 4 times per day Alford Highland, MD   25 mg at 07/25/15 0802  . insulin aspart (novoLOG) injection 0-5 Units  0-5 Units Subcutaneous QHS Alford Highland, MD   3 Units at 07/24/15 2138  . insulin aspart (novoLOG) injection 0-9 Units  0-9 Units Subcutaneous TID WC Alford Highland, MD   3 Units at 07/25/15 1715  . levofloxacin (LEVAQUIN) tablet 750 mg  750 mg Oral Daily Alford Highland, MD   750 mg at 07/25/15 1738  . magnesium hydroxide (MILK OF MAGNESIA) suspension 30 mL  30 mL Oral Daily PRN Shari Prows, MD   30 mL at 07/21/15 2323  . metoprolol succinate (TOPROL-XL) 24 hr tablet 200 mg  200 mg Oral Daily Shari Prows, MD   200 mg at 07/25/15 1610  . mirtazapine (REMERON) tablet 30 mg  30 mg Oral QHS Shari Prows, MD   30 mg at 07/24/15 2130  . OLANZapine (ZYPREXA) injection 10 mg  10 mg Intramuscular BID AC Audery Amel, MD   10 mg at 07/25/15 1724  . ondansetron (ZOFRAN) injection 4 mg  4 mg Intravenous Once PRN Yves Dill, MD      . sodium chloride 0.9 % bolus 1,000 mL  1,000 mL Intravenous Once Alford Highland, MD      . traZODone (DESYREL) tablet 25 mg   25 mg Oral TID Shari Prows, MD   25 mg at 07/25/15 1401    Lab Results:  Results for orders placed or performed during the hospital encounter of 07/16/15 (from the past 48 hour(s))  Glucose, capillary     Status: Abnormal   Collection Time: 07/24/15  6:56 AM  Result Value Ref Range   Glucose-Capillary 215 (H) 65 - 99 mg/dL  Glucose, capillary     Status: Abnormal   Collection Time: 07/25/15  2:24 PM  Result Value Ref Range   Glucose-Capillary 230 (H) 65 - 99 mg/dL    Physical Findings: AIMS: Facial and Oral Movements Muscles of Facial Expression: None, normal Lips and Perioral Area: None, normal Jaw: None, normal Tongue: None, normal,Extremity Movements Upper (arms, wrists, hands, fingers): None, normal Lower (legs, knees, ankles, toes): None, normal, Trunk Movements Neck, shoulders, hips: None, normal, Overall Severity Severity of abnormal movements (highest score from questions above): None, normal Incapacitation due to abnormal movements: None, normal Patient's awareness of abnormal movements (rate only patient's report): No Awareness, Dental Status Current problems with teeth and/or dentures?: No Does patient usually wear dentures?: No  CIWA:    COWS:     Treatment Plan Summary: Daily contact with patient to assess and evaluate symptoms and progress in treatment and Medication management   Medical Decision Making:  Established Problem, Stable/Improving (1), Review of Psycho-Social Stressors (1), Review or order clinical lab tests (1), Review of Medication Regimen & Side Effects (2) and Review of New Medication or Change in Dosage (2)   Mr. Franklin is an 64 year old male with a history of bipolar illness admitted for worsening of depression, psychosis, and agitated behavior.   1. Agitation. Resolved.   2. Mood and psychosis. Patient is not taking his medicine. Therefore I will give him IM injections of Zyprexa twice a day. Continue with planned for 3 times a week  ECT next treatment Friday. Counseling and psychoeducation with the patient. 3. ECT. Dr. Toni Amend spoke with the patient. Wife to help with decision about ECT.   4. Hypertension we will continue metoprolol, Norvasc, and Hydralazine along with aspirin.  5. Dyslipidemia. He is on Lipitor.  6. Diabetes. He is on glipizide with Accu-Cheks.  7. Gout. We continue allopurinol and colchicine.  8. Edema. We will continue furosemide.   9. UTI treated on medical floor hopefully resolved.   10. Diabetic foot. Wound care consult completed. Podiatry consult.  11. Disposition. To be established. He will most likely will return to home with his wife. Placement is not out of the question.   Continue ECT treatment into next week Monday Wednesday and Friday. Medical workup and treatment as per recommendation of hospitalist. Hopefully we will be able to get a chest x-ray by tomorrow. No other change to current acute medication.  Addendum: Chest x-ray was finally obtained and has been reviewed. No sign on an of active disease or infection. Last set of vital signs at 1 PM today temperature had come down. He did not feel febrile to me. Continue vital signs checks. Reviewed whole treatment plan with current nursing staff.  [name] 07/25/2015, 8:24 PM

## 2015-07-25 NOTE — Anesthesia Preprocedure Evaluation (Signed)
Anesthesia Evaluation  Patient identified by MRN, date of birth, ID band Patient awake    Reviewed: Allergy & Precautions, NPO status , Patient's Chart, lab work & pertinent test results  History of Anesthesia Complications Negative for: history of anesthetic complications  Airway Mallampati: III       Dental  (+) Poor Dentition   Pulmonary sleep apnea ,    Pulmonary exam normal       Cardiovascular hypertension, Pt. on medications +CHF Normal cardiovascular exam    Neuro/Psych Depression Bipolar Disorder negative neurological ROS     GI/Hepatic negative GI ROS, Neg liver ROS,   Endo/Other  diabetes, Type 2, Oral Hypoglycemic Agents  Renal/GU CRFRenal disease     Musculoskeletal   Abdominal   Peds  Hematology   Anesthesia Other Findings   Reproductive/Obstetrics                             Anesthesia Physical Anesthesia Plan  ASA: III  Anesthesia Plan: General   Post-op Pain Management:    Induction: Intravenous  Airway Management Planned: Nasal Cannula  Additional Equipment:   Intra-op Plan:   Post-operative Plan:   Informed Consent: I have reviewed the patients History and Physical, chart, labs and discussed the procedure including the risks, benefits and alternatives for the proposed anesthesia with the patient or authorized representative who has indicated his/her understanding and acceptance.     Plan Discussed with: CRNA  Anesthesia Plan Comments:         Anesthesia Quick Evaluation

## 2015-07-26 LAB — BASIC METABOLIC PANEL
Anion gap: 10 (ref 5–15)
BUN: 22 mg/dL — ABNORMAL HIGH (ref 6–20)
CO2: 30 mmol/L (ref 22–32)
Calcium: 9.5 mg/dL (ref 8.9–10.3)
Chloride: 104 mmol/L (ref 101–111)
Creatinine, Ser: 1.34 mg/dL — ABNORMAL HIGH (ref 0.61–1.24)
GFR calc Af Amer: >60 mL/min (ref >60)
GFR calc non Af Amer: 56 mL/min — ABNORMAL LOW (ref >60)
Glucose, Bld: 186 mg/dL — ABNORMAL HIGH (ref 65–99)
Potassium: 4.7 mmol/L (ref 3.5–5.1)
Sodium: 144 mmol/L (ref 135–145)

## 2015-07-26 LAB — CBC
HCT: 51.5 % (ref 40.0–52.0)
Hemoglobin: 17 g/dL (ref 13.0–18.0)
MCH: 30.1 pg (ref 26.0–34.0)
MCHC: 33 g/dL (ref 32.0–36.0)
MCV: 91.2 fL (ref 80.0–100.0)
Platelets: 257 10*3/uL (ref 150–440)
RBC: 5.65 MIL/uL (ref 4.40–5.90)
RDW: 12.8 % (ref 11.5–14.5)
WBC: 6.9 10*3/uL (ref 3.8–10.6)

## 2015-07-26 MED ORDER — CLONAZEPAM 1 MG PO TABS
1.0000 mg | ORAL_TABLET | Freq: Every day | ORAL | Status: DC
  Administered 2015-07-26 – 2015-07-28 (×3): 1 mg via ORAL
  Filled 2015-07-26 (×3): qty 1

## 2015-07-26 MED ORDER — MEGESTROL ACETATE 400 MG/10ML PO SUSP
400.0000 mg | Freq: Every day | ORAL | Status: DC
  Administered 2015-07-26 – 2015-07-29 (×4): 400 mg via ORAL
  Filled 2015-07-26 (×5): qty 10

## 2015-07-26 NOTE — Progress Notes (Addendum)
Patient laying in bed. .  Patient is somewhat medication complaint.  Patient denies any HI/SI/AVH.  Patient refuses to get out of the bed to shower or eat.  Patient has increased his oral intake and drinks glucerna and mighty shakes.  Patient will remain with sitter for safety.

## 2015-07-26 NOTE — [facility] Group Notes (Signed)
[facility] Group Notes:  (Nursing/MHT/Case Management/Adjunct)  Date:  07/26/2015  Time:  9:36 PM  Type of Therapy:  Psychoeducational Skills  Participation Level:  Did Not Attend   Summary of Progress/Problems:  Foy Gu[place] 07/26/2015, 9:36 PM

## 2015-07-26 NOTE — Progress Notes (Signed)
[facility] MD Progress Note  07/26/2015 11:47 AM Christian [name]  MRN:  [id number]  Subjective:  [name] feels better today. He ihad hi milk shake. He complains of poor sleep that he believes can be helped with a dose of clonazepam. He tolerates medications well. There are no somatic complaints. He is actually able to hold a conversation today. He will continue in ECT next week. His wife is visiting regularly   Principal Problem: Bipolar disorder, current episode depressed, severe, with psychotic features Diagnosis:   Patient Active Problem List   Diagnosis Date Noted  . Bipolar disorder, current episode depressed, severe, with psychotic features [F31.5] 07/09/2015  . Diabetes [E11.9] 07/09/2015  . UTI (urinary tract infection) [N39.0] 06/19/2014  . Positive blood culture [R78.81] 06/19/2014  . Bipolar affective disorder, current episode manic with psychotic symptoms [F31.2] 06/17/2014  . Type II or unspecified type diabetes mellitus with unspecified complication, uncontrolled [E11.8] 06/02/2014  . Other and unspecified hyperlipidemia [E78.5] 06/02/2014  . OSA on CPAP [G47.33] 06/02/2014  . 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 Gout [M10.9] 06/02/2014  . Hypertension [I10]    Total Time spent with patient: 20 minutes   Past Medical History:  Past Medical History  Diagnosis Date  . Mood swings   . Type II or unspecified type diabetes mellitus with unspecified complication, uncontrolled 06/02/2014  . Other and unspecified hyperlipidemia 06/02/2014  . OSA on CPAP 06/02/2014  . 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 Bipolar disorder   . CHF (congestive heart failure)   . CKD (chronic kidney disease), stage III   . Diabetes mellitus without complication   . Hypertension   . Gout   . Bipolar 1 disorder   . OSA on CPAP    History reviewed. No pertinent past surgical history. Family History:  Family  History  Problem Relation Age of Onset  . Dementia Mother   . Arthritis Father   . Heart failure Neg Hx   . Kidney failure Neg Hx   . Cancer Neg Hx    Social History:  History  Alcohol Use No     History  Drug Use No    Social History   Social History  . Marital Status: Married    Spouse Name: N/A  . Number of Children: N/A  . Years of Education: N/A   Social History Main Topics  . Smoking status: Never Smoker   . Smokeless tobacco: None  . Alcohol Use: No  . Drug Use: No  . Sexual Activity: Not Currently   Other Topics Concern  . None   Social History Narrative   ** Merged History Encounter **       Additional History:    Sleep: Poor  Appetite:  Poor   Assessment:   Musculoskeletal: Strength & Muscle Tone: within normal limits Gait & Station: normal Patient leans: N/A   Psychiatric Specialty Exam: Physical Exam  Nursing note and vitals reviewed.   Review of Systems  All other systems reviewed and are negative.   Blood pressure 155/108, pulse 108, temperature 98.7 F (37.1 C), temperature source Oral, resp. rate 22, height 6' (1.829 m), weight 99.519 kg (219 lb 6.4 oz), SpO2 97 %.Body mass index is 29.75 kg/(m^2).  General Appearance: Casual  Eye Contact::  Fair  Speech:  Slow  Volume:  Decreased  Mood:  Depressed  Affect:  Flat  Thought Process:  Goal Directed  Orientation:  Full (Time, Place, and Person)  Thought Content:  WDL  Suicidal Thoughts:  Yes.  with intent/plan  Homicidal Thoughts:  No  Memory:  Immediate;   Fair Recent;   Fair Remote;   Fair  Judgement:  Fair  Insight:  Fair  Psychomotor Activity:  Decreased  Concentration:  Fair  Recall:  [employer] of Knowledge:Fair  Language: Fair  Akathisia:  No  Handed:  Right  AIMS (if indicated):     Assets:  Communication Skills Desire for Improvement Financial Resources/Insurance Housing Social Support  ADL's:  Intact  Cognition: WNL  Sleep:  Number of Hours: 6.5      Current Medications: Current Facility-Administered Medications  Medication Dose Route Frequency Provider Last Rate Last Dose  . 0.9 %  sodium chloride infusion  250 mL Intravenous Once Gonzella Lex, MD   250 mL at 07/23/15 1325  . acetaminophen (TYLENOL) tablet 650 mg  650 mg Oral Q6H PRN Clovis Fredrickson, MD   650 mg at 07/23/15 1956  . allopurinol (ZYLOPRIM) tablet 300 mg  300 mg Oral Daily Rishi Vicario B Kuzey Ogata, MD   300 mg at 07/26/15 1107  . alum & mag hydroxide-simeth (MAALOX/MYLANTA) 200-200-20 MG/5ML suspension 30 mL  30 mL Oral Q4H PRN Fumi Guadron B Kellie Murrill, MD      . aspirin chewable tablet 81 mg  81 mg Oral Daily Aylen Stradford B Rondale Nies, MD   81 mg at 07/26/15 1109  . clonazePAM (KLONOPIN) tablet 1 mg  1 mg Oral QHS Jamaine Quintin B Lanita Stammen, MD      . colchicine tablet 0.6 mg  0.6 mg Oral Daily Loletha Grayer, MD   0.6 mg at 07/26/15 1108  . diltiazem (CARDIZEM CD) 24 hr capsule 120 mg  120 mg Oral Daily Loletha Grayer, MD   120 mg at 07/26/15 1108  . feeding supplement (GLUCERNA SHAKE) (GLUCERNA SHAKE) liquid 237 mL  237 mL Oral TID WC Cevin Rubinstein B Araeya Lamb, MD   237 mL at 07/26/15 0800  . hydrALAZINE (APRESOLINE) tablet 25 mg  25 mg Oral 4 times per day Loletha Grayer, MD   25 mg at 07/25/15 0802  . insulin aspart (novoLOG) injection 0-5 Units  0-5 Units Subcutaneous QHS Loletha Grayer, MD   3 Units at 07/24/15 2138  . insulin aspart (novoLOG) injection 0-9 Units  0-9 Units Subcutaneous TID WC Loletha Grayer, MD   3 Units at 07/26/15 [phone]  . levofloxacin (LEVAQUIN) tablet 750 mg  750 mg Oral Daily Loletha Grayer, MD   750 mg at 07/26/15 1109  . magnesium hydroxide (MILK OF MAGNESIA) suspension 30 mL  30 mL Oral Daily PRN Clovis Fredrickson, MD   30 mL at 07/21/15 2323  . megestrol (MEGACE) 400 MG/10ML suspension 400 mg  400 mg Oral Daily Dustin Flock, MD      . metoprolol succinate (TOPROL-XL) 24 hr tablet 200 mg  200 mg Oral Daily Marquia Costello B Jesselee Poth, MD   200 mg at  07/26/15 1108  . mirtazapine (REMERON) tablet 30 mg  30 mg Oral QHS Clovis Fredrickson, MD   30 mg at 07/25/15 2132  . OLANZapine (ZYPREXA) injection 10 mg  10 mg Intramuscular BID AC Gonzella Lex, MD   10 mg at 07/26/15 0840  . ondansetron (ZOFRAN) injection 4 mg  4 mg Intravenous Once PRN Alvin Critchley, MD      . sodium chloride 0.9 % bolus 1,000 mL  1,000 mL Intravenous Once Loletha Grayer, MD      .  traZODone (DESYREL) tablet 25 mg  25 mg Oral TID Clovis Fredrickson, MD   25 mg at 07/26/15 1107    Lab Results:  Results for orders placed or performed during the hospital encounter of 07/16/15 (from the past 48 hour(s))  Glucose, capillary     Status: Abnormal   Collection Time: 07/25/15  2:24 PM  Result Value Ref Range   Glucose-Capillary 230 (H) 65 - 99 mg/dL  CBC     Status: None   Collection Time: 07/25/15 11:44 PM  Result Value Ref Range   WBC 6.9 3.8 - 10.6 K/uL   RBC 5.65 4.40 - 5.90 MIL/uL   Hemoglobin 17.0 13.0 - 18.0 g/dL   HCT 51.5 40.0 - 52.0 %   MCV 91.2 80.0 - 100.0 fL   MCH 30.1 26.0 - 34.0 pg   MCHC 33.0 32.0 - 36.0 g/dL   RDW 12.8 11.5 - 14.5 %   Platelets 257 150 - 440 K/uL  Basic metabolic panel     Status: Abnormal   Collection Time: 07/25/15 11:44 PM  Result Value Ref Range   Sodium 144 135 - 145 mmol/L   Potassium 4.7 3.5 - 5.1 mmol/L   Chloride 104 101 - 111 mmol/L   CO2 30 22 - 32 mmol/L   Glucose, Bld 186 (H) 65 - 99 mg/dL   BUN 22 (H) 6 - 20 mg/dL   Creatinine, Ser 1.34 (H) 0.61 - 1.24 mg/dL   Calcium 9.5 8.9 - 10.3 mg/dL   GFR calc non Af Amer 56 (L) >60 mL/min   GFR calc Af Amer >60 >60 mL/min    Comment: (NOTE) The eGFR has been calculated using the CKD EPI equation. This calculation has not been validated in all clinical situations. eGFR's persistently <60 mL/min signify possible Chronic Kidney Disease.    Anion gap 10 5 - 15    Physical Findings: AIMS: Facial and Oral Movements Muscles of Facial Expression: None, normal Lips and  Perioral Area: None, normal Jaw: None, normal Tongue: None, normal,Extremity Movements Upper (arms, wrists, hands, fingers): None, normal Lower (legs, knees, ankles, toes): None, normal, Trunk Movements Neck, shoulders, hips: None, normal, Overall Severity Severity of abnormal movements (highest score from questions above): None, normal Incapacitation due to abnormal movements: None, normal Patient's awareness of abnormal movements (rate only patient's report): No Awareness, Dental Status Current problems with teeth and/or dentures?: No Does patient usually wear dentures?: No  CIWA:    COWS:     Treatment Plan Summary: Daily contact with patient to assess and evaluate symptoms and progress in treatment and Medication management   Medical Decision Making:  Established Problem, Stable/Improving (1), Review of Psycho-Social Stressors (1), Review or order clinical lab tests (1), Review of Medication Regimen & Side Effects (2) and Review of New Medication or Change in Dosage (2)   Christian [name] is an 64 year old male with a history of bipolar illness admitted for worsening of depression, psychosis, and agitated behavior.   1. Agitation. Resolved.   2. Mood and psychosis. Patient is not taking his medicine. Therefore I will give him IM injections of Zyprexa twice a day. Continue with planned for 3 times a week ECT next treatment Friday. Counseling and psychoeducation with the patient.  3. ECT. Continue ECT treatment into next week Monday Wednesday and Friday.  4. Hypertension we will continue metoprolol, Norvasc, and Hydralazine along with aspirin.  5. Dyslipidemia. He is on Lipitor.  6. Diabetes. He is on glipizide with Accu-Cheks.  7. Gout. We continue allopurinol and colchicine.  8. Edema. We will continue furosemide.   9. UTI treated on medical floor hopefully resolved.   10. Diabetic foot. Wound care consult completed. Podiatry consult.  11. Insomnia. I will add Clonazepam 1 mg  qhs.  12. Disposition. To be established. He will will return to home with his wife.      Hilary Pundt 07/26/2015, 11:47 AM

## 2015-07-26 NOTE — Plan of Care (Signed)
Problem: Ineffective individual coping Goal: STG: Patient will remain free from self harm Outcome: Progressing Patient remains on 1:1 observation

## 2015-07-26 NOTE — Progress Notes (Signed)
Patient ID: Christian [name], male   DOB: 04-16-1951, 64 y.o.   MRN: [id number]  [facility] PROGRESS NOTE  PCP: Leanor Rubenstein, MD  HPI/Subjective:  Feels little better ate more food     CONSTITUTIONAL: No documented fever. No fatigue, weakness. No weight gain, no weight loss.  EYES: No blurry or double vision.  ENT: No tinnitus. No postnasal drip. No redness of the oropharynx.  RESPIRATORY: No cough, no wheeze, no hemoptysis. No dyspnea.  CARDIOVASCULAR: No chest pain. No orthopnea. No palpitations. No syncope.  GASTROINTESTINAL: No nausea, no vomiting or diarrhea. No abdominal pain. No melena or hematochezia.  GENITOURINARY:  No urgency. No frequency. No dysuria. No hematuria. No obstructive symptoms. No discharge. No pain. No significant abnormal bleeding ENDOCRINE: No polyuria or nocturia. No heat or cold intolerance.  HEMATOLOGY: No anemia. No bruising. No bleeding. No purpura. No petechiae INTEGUMENTARY: No rashes. No lesions.  MUSCULOSKELETAL: No arthritis. No swelling. No gout.  NEUROLOGIC: No numbness, tingling, or ataxia. No seizure-type activity.  PSYCHIATRIC: No anxiety. No insomnia. No ADD.     Objective: Filed Vitals:   07/26/15 0653  BP: 155/108  Pulse: 108  Temp: 98.7 F (37.1 C)  Resp: 22   ROS: Review of Systems  Unable to perform ROS  secondary to severe depression Exam: Physical Exam  HENT:  Nose: No mucosal edema.  Mouth/Throat: No oropharyngeal exudate or posterior oropharyngeal edema.  Eyes: Conjunctivae, EOM and lids are normal. Pupils are equal, round, and reactive to light.  Neck: No JVD present. Carotid bruit is not present. No edema present. No thyroid mass and no thyromegaly present.  Cardiovascular: S1 normal and S2 normal.  Tachycardia present.  Exam reveals no gallop.   No murmur heard. Pulses:      Dorsalis pedis pulses are 2+ on the right side, and 2+ on the left side.  Respiratory: No respiratory distress. He has no  wheezes. He has no rhonchi. He has no rales.  GI: Soft. Bowel sounds are normal. There is no tenderness.  Musculoskeletal:       Right ankle: He exhibits swelling.       Left ankle: He exhibits swelling.  Lymphadenopathy:    He has no cervical adenopathy.  Neurological: He is alert. No cranial nerve deficit.  Skin: Skin is warm. No rash noted. Nails show no clubbing.  Large Left medial foot blood blister with skin intact.  Psychiatric: His affect is blunt.    Data Reviewed: Basic Metabolic Panel:  Recent Labs Lab 07/20/15 0733 07/23/15 1249 07/23/15 1250 07/25/15 2344  NA 140 138  --  144  K 4.4 4.5  --  4.7  CL 99* 103  --  104  CO2 27 25  --  30  GLUCOSE 182* 227*  --  186*  BUN 27* 21*  --  22*  CREATININE 1.56* 1.27*  --  1.34*  CALCIUM 9.3 8.7*  --  9.5  MG  --   --  1.8  --    CBC:  Recent Labs Lab 07/20/15 0733 07/25/15 2344  WBC 11.1* 6.9  NEUTROABS 8.8*  --   HGB 16.0 17.0  HCT 47.9 51.5  MCV 89.9 91.2  PLT 276 257    Scheduled Meds: . sodium chloride  250 mL Intravenous Once  . allopurinol  300 mg Oral Daily  . aspirin  81 mg Oral Daily  . clonazePAM  1 mg Oral QHS  . colchicine  0.6 mg Oral Daily  .  diltiazem  120 mg Oral Daily  . feeding supplement (GLUCERNA SHAKE)  237 mL Oral TID WC  . hydrALAZINE  25 mg Oral 4 times per day  . insulin aspart  0-5 Units Subcutaneous QHS  . insulin aspart  0-9 Units Subcutaneous TID WC  . levofloxacin  750 mg Oral Daily  . megestrol  400 mg Oral Daily  . metoprolol succinate  200 mg Oral Daily  . mirtazapine  30 mg Oral QHS  . OLANZapine  10 mg Intramuscular BID AC  . sodium chloride  1,000 mL Intravenous Once  . traZODone  25 mg Oral TID    Assessment/Plan:  1. Fever-  likely due to ECT is afebrile WBC count normal test x-ray negative  2. Dehydration, acute on chronic kidney disease stage III.-encourage for fluid intake. 3. Intact blood Blister left heel- skin intact, no further treatment. Keep  covered. 4. Essential hypertension, tachycardia- continue metoprolol, added Cardizem CD this a.m. and restarted hydralazine. 4. Diabetes type 2- sliding-scale only since the patient is missing meals and refusing fingersticks. 5. Hyperlipidemia unspecified- hold atorvastatin 6. History of gout on allopurinol and colchicine. 7. Severe depression- psychiatry managing with medications and ECT. Patient status post second ECT treatment add Megace Code Status:     Code Status Orders        Start     Ordered   07/16/15 1213  Full code   Continuous     07/16/15 1213     Disposition Plan: To be determined  Time spent: 20 minutes  Amun Stemm, [facility]  [facility]

## 2015-07-26 NOTE — Plan of Care (Signed)
Problem: Ineffective individual coping Goal: LTG: Patient will report a decrease in negative feelings Outcome: Not Progressing Patient remains guarded, resistant, refusing services at times

## 2015-07-26 NOTE — Progress Notes (Signed)
Patient has stayed in bed, sitter monitoring. Had his bed time medications without resistance. Sad and depressed with some resistance to nursing interventions. Received hi medication and stayed in bed.. Currently awake, sitter at bedside. Safety precautions maintained.

## 2015-07-26 NOTE — [facility] Group Notes (Signed)
[facility] LCSW Group Therapy  07/26/2015 1:56 PM  Type of Therapy:  Group Therapy  Participation Level:  Did Not Attend  Modes of Intervention:  Discussion, Education, Socialization and Support  Summary of Progress/Problems: Pt will identify unhealthy thoughts and how they impact their emotions and behavior. Pt will be encouraged to discuss these thoughts, emotions and behaviors with the group.   Yizel Canby L Burhanuddin Kohlmann MSW, LCSWA  07/26/2015, 1:56 PM 

## 2015-07-27 NOTE — Progress Notes (Addendum)
Patient ID: Christian [name], male   DOB: Feb 17, 1951, 64 y.o.   MRN: [id number]  [facility] PROGRESS NOTE  PCP: Christian Rubenstein, MD  HPI/Subjective:  Patient refused any food or medications      Objective: Filed Vitals:   07/27/15 0000  BP: 144/91  Pulse:   Temp:   Resp:    ROS: Review of Systems  Unable to perform ROS  secondary to severe depression Exam: Physical Exam  HENT:  Nose: No mucosal edema.  Mouth/Throat: No oropharyngeal exudate or posterior oropharyngeal edema.  Eyes: Conjunctivae, EOM and lids are normal. Pupils are equal, round, and reactive to light.  Neck: No JVD present. Carotid bruit is not present. No edema present. No thyroid mass and no thyromegaly present.  Cardiovascular: S1 normal and S2 normal.  Tachycardia present.  Exam reveals no gallop.   No murmur heard. Pulses:      Dorsalis pedis pulses are 2+ on the right side, and 2+ on the left side.  Respiratory: No respiratory distress. He has no wheezes. He has no rhonchi. He has no rales.  GI: Soft. Bowel sounds are normal. There is no tenderness.  Musculoskeletal:       Right ankle: He exhibits swelling.       Left ankle: He exhibits swelling.  Lymphadenopathy:    He has no cervical adenopathy.  Neurological: He is alert. No cranial nerve deficit.  Skin: Skin is warm. No rash noted. Nails show no clubbing.  Large Left medial foot blood blister with skin intact.  Psychiatric: His affect is blunt.    Data Reviewed: Basic Metabolic Panel:  Recent Labs Lab 07/23/15 1249 07/23/15 1250 07/25/15 2344  NA 138  --  144  K 4.5  --  4.7  CL 103  --  104  CO2 25  --  30  GLUCOSE 227*  --  186*  BUN 21*  --  22*  CREATININE 1.27*  --  1.34*  CALCIUM 8.7*  --  9.5  MG  --  1.8  --    CBC:  Recent Labs Lab 07/25/15 2344  WBC 6.9  HGB 17.0  HCT 51.5  MCV 91.2  PLT 257    Scheduled Meds: . sodium chloride  250 mL Intravenous Once  . allopurinol  300 mg Oral Daily  .  aspirin  81 mg Oral Daily  . clonazePAM  1 mg Oral QHS  . colchicine  0.6 mg Oral Daily  . diltiazem  120 mg Oral Daily  . feeding supplement (GLUCERNA SHAKE)  237 mL Oral TID WC  . hydrALAZINE  25 mg Oral 4 times per day  . insulin aspart  0-5 Units Subcutaneous QHS  . insulin aspart  0-9 Units Subcutaneous TID WC  . levofloxacin  750 mg Oral Daily  . megestrol  400 mg Oral Daily  . metoprolol succinate  200 mg Oral Daily  . mirtazapine  30 mg Oral QHS  . OLANZapine  10 mg Intramuscular BID AC  . sodium chloride  1,000 mL Intravenous Once  . traZODone  25 mg Oral TID    Assessment/Plan:  1. Fever-  likely due to ECT is afebrile WBC count normal test x-ray negative  2. Dehydration, acute on chronic kidney disease stage III.-encourage for fluid intake. 3. Intact blood Blister left heel- skin intact, no further treatment. Keep covered. 4. Essential hypertension, tachycardia- continue metoprolol,Cardizem CD  And hydralazine. 4. Diabetes type 2- sliding-scale only since the patient not eating  much. 5. Hyperlipidemia unspecified- hold atorvastatin 6. History of gout on allopurinol and colchicine. 7. Severe depression- psychiatry managing with medications and ECT. Patient status post second ECT treatment add Megace Code Status:     Code Status Orders        Start     Ordered   07/16/15 1213  Full code   Continuous     07/16/15 1213     Disposition Plan: To be determined  Time spent: 20 minutes  Christian [name], [facility]  [facility] [place] [facility]

## 2015-07-27 NOTE — Progress Notes (Signed)
[facility] MD Progress Note  07/27/2015 5:32 PM Christian [name]  MRN:  [id number]  Subjective:  Christian [name] is depressed, despondent, and negative. He refused food. He took medications with much encouragement. He refuses to get out of bed.  Principal Problem: Bipolar disorder, current episode depressed, severe, with psychotic features Diagnosis:   Patient Active Problem List   Diagnosis Date Noted  . Bipolar disorder, current episode depressed, severe, with psychotic features [F31.5] 07/09/2015  . Diabetes [E11.9] 07/09/2015  . UTI (urinary tract infection) [N39.0] 06/19/2014  . Positive blood culture [R78.81] 06/19/2014  . Bipolar affective disorder, current episode manic with psychotic symptoms [F31.2] 06/17/2014  . Type II or unspecified type diabetes mellitus with unspecified complication, uncontrolled [E11.8] 06/02/2014  . Other and unspecified hyperlipidemia [E78.5] 06/02/2014  . OSA on CPAP [G47.33] 06/02/2014  . 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 Gout [M10.9] 06/02/2014  . Hypertension [I10]    Total Time spent with patient: 20 minutes   Past Medical History:  Past Medical History  Diagnosis Date  . Mood swings   . Type II or unspecified type diabetes mellitus with unspecified complication, uncontrolled 06/02/2014  . Other and unspecified hyperlipidemia 06/02/2014  . OSA on CPAP 06/02/2014  . 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 Bipolar disorder   . CHF (congestive heart failure)   . CKD (chronic kidney disease), stage III   . Diabetes mellitus without complication   . Hypertension   . Gout   . Bipolar 1 disorder   . OSA on CPAP    History reviewed. No pertinent past surgical history. Family History:  Family History  Problem Relation Age of Onset  . Dementia Mother   . Arthritis Father   . Heart failure Neg Hx   . Kidney failure Neg Hx   . Cancer Neg Hx    Social History:   History  Alcohol Use No     History  Drug Use No    Social History   Social History  . Marital Status: Married    Spouse Name: N/A  . Number of Children: N/A  . Years of Education: N/A   Social History Main Topics  . Smoking status: Never Smoker   . Smokeless tobacco: None  . Alcohol Use: No  . Drug Use: No  . Sexual Activity: Not Currently   Other Topics Concern  . None   Social History Narrative   ** Merged History Encounter **       Additional History:    Sleep: Good  Appetite:  Poor   Assessment:   Musculoskeletal: Strength & Muscle Tone: within normal limits Gait & Station: normal Patient leans: N/A   Psychiatric Specialty Exam: Physical Exam  Nursing note and vitals reviewed.   Review of Systems  All other systems reviewed and are negative.   Blood pressure 144/91, pulse 92, temperature 98.2 F (36.8 C), temperature source Axillary, resp. rate 20, height 6' (1.829 m), weight 99.519 kg (219 lb 6.4 oz), SpO2 98 %.Body mass index is 29.75 kg/(m^2).  General Appearance: Casual  Eye Contact::  None  Speech:  Blocked  Volume:  Decreased  Mood:  Depressed, Hopeless and Worthless  Affect:  Flat  Thought Process:  Linear  Orientation:  Full (Time, Place, and Person)  Thought Content:  WDL  Suicidal Thoughts:  Yes.  with intent/plan  Homicidal Thoughts:  No  Memory:  Immediate;   Fair Recent;  Fair Remote;   Fair  Judgement:  Fair  Insight:  Fair  Psychomotor Activity:  Decreased  Concentration:  Fair  Recall:  [employer] of Knowledge:Fair  Language: Fair  Akathisia:  No  Handed:  Right  AIMS (if indicated):     Assets:  Communication Skills  ADL's:  Intact  Cognition: WNL  Sleep:  Number of Hours: 8     Current Medications: Current Facility-Administered Medications  Medication Dose Route Frequency Provider Last Rate Last Dose  . 0.9 %  sodium chloride infusion  250 mL Intravenous Once Gonzella Lex, MD   250 mL at 07/23/15 1325   . acetaminophen (TYLENOL) tablet 650 mg  650 mg Oral Q6H PRN Clovis Fredrickson, MD   650 mg at 07/23/15 1956  . allopurinol (ZYLOPRIM) tablet 300 mg  300 mg Oral Daily Audrianna Driskill B Topher Buenaventura, MD   300 mg at 07/27/15 1010  . alum & mag hydroxide-simeth (MAALOX/MYLANTA) 200-200-20 MG/5ML suspension 30 mL  30 mL Oral Q4H PRN Jadelynn Boylan B Esthefany Herrig, MD      . aspirin chewable tablet 81 mg  81 mg Oral Daily Kathryne Ramella B Tyashia Morrisette, MD   81 mg at 07/27/15 1011  . clonazePAM (KLONOPIN) tablet 1 mg  1 mg Oral QHS Clovis Fredrickson, MD   1 mg at 07/26/15 2227  . colchicine tablet 0.6 mg  0.6 mg Oral Daily Loletha Grayer, MD   0.6 mg at 07/27/15 1010  . diltiazem (CARDIZEM CD) 24 hr capsule 120 mg  120 mg Oral Daily Loletha Grayer, MD   120 mg at 07/27/15 1011  . feeding supplement (GLUCERNA SHAKE) (GLUCERNA SHAKE) liquid 237 mL  237 mL Oral TID WC Clenton Esper B Naje Rice, MD   237 mL at 07/27/15 1200  . hydrALAZINE (APRESOLINE) tablet 25 mg  25 mg Oral 4 times per day Loletha Grayer, MD   25 mg at 07/27/15 1701  . insulin aspart (novoLOG) injection 0-5 Units  0-5 Units Subcutaneous QHS Loletha Grayer, MD   3 Units at 07/24/15 2138  . insulin aspart (novoLOG) injection 0-9 Units  0-9 Units Subcutaneous TID WC Loletha Grayer, MD   1 Units at 07/27/15 1702  . levofloxacin (LEVAQUIN) tablet 750 mg  750 mg Oral Daily Loletha Grayer, MD   750 mg at 07/27/15 1010  . magnesium hydroxide (MILK OF MAGNESIA) suspension 30 mL  30 mL Oral Daily PRN Clovis Fredrickson, MD   30 mL at 07/21/15 2323  . megestrol (MEGACE) 400 MG/10ML suspension 400 mg  400 mg Oral Daily Dustin Flock, MD   400 mg at 07/27/15 1012  . metoprolol succinate (TOPROL-XL) 24 hr tablet 200 mg  200 mg Oral Daily Ramonia Mcclaran B Wallis Spizzirri, MD   200 mg at 07/27/15 1010  . mirtazapine (REMERON) tablet 30 mg  30 mg Oral QHS Clovis Fredrickson, MD   30 mg at 07/26/15 2227  . OLANZapine (ZYPREXA) injection 10 mg  10 mg Intramuscular BID AC Gonzella Lex, MD   10 mg at 07/27/15 1659  . ondansetron (ZOFRAN) injection 4 mg  4 mg Intravenous Once PRN Alvin Critchley, MD      . sodium chloride 0.9 % bolus 1,000 mL  1,000 mL Intravenous Once Loletha Grayer, MD      . traZODone (DESYREL) tablet 25 mg  25 mg Oral TID Clovis Fredrickson, MD   25 mg at 07/27/15 1700    Lab Results:  Results for orders placed or performed during  the hospital encounter of 07/16/15 (from the past 48 hour(s))  CBC     Status: None   Collection Time: 07/25/15 11:44 PM  Result Value Ref Range   WBC 6.9 3.8 - 10.6 K/uL   RBC 5.65 4.40 - 5.90 MIL/uL   Hemoglobin 17.0 13.0 - 18.0 g/dL   HCT 51.5 40.0 - 52.0 %   MCV 91.2 80.0 - 100.0 fL   MCH 30.1 26.0 - 34.0 pg   MCHC 33.0 32.0 - 36.0 g/dL   RDW 12.8 11.5 - 14.5 %   Platelets 257 150 - 440 K/uL  Basic metabolic panel     Status: Abnormal   Collection Time: 07/25/15 11:44 PM  Result Value Ref Range   Sodium 144 135 - 145 mmol/L   Potassium 4.7 3.5 - 5.1 mmol/L   Chloride 104 101 - 111 mmol/L   CO2 30 22 - 32 mmol/L   Glucose, Bld 186 (H) 65 - 99 mg/dL   BUN 22 (H) 6 - 20 mg/dL   Creatinine, Ser 1.34 (H) 0.61 - 1.24 mg/dL   Calcium 9.5 8.9 - 10.3 mg/dL   GFR calc non Af Amer 56 (L) >60 mL/min   GFR calc Af Amer >60 >60 mL/min    Comment: (NOTE) The eGFR has been calculated using the CKD EPI equation. This calculation has not been validated in all clinical situations. eGFR's persistently <60 mL/min signify possible Chronic Kidney Disease.    Anion gap 10 5 - 15    Physical Findings: AIMS: Facial and Oral Movements Muscles of Facial Expression: None, normal Lips and Perioral Area: None, normal Jaw: None, normal Tongue: None, normal,Extremity Movements Upper (arms, wrists, hands, fingers): None, normal Lower (legs, knees, ankles, toes): None, normal, Trunk Movements Neck, shoulders, hips: None, normal, Overall Severity Severity of abnormal movements (highest score from questions above): None,  normal Incapacitation due to abnormal movements: None, normal Patient's awareness of abnormal movements (rate only patient's report): No Awareness, Dental Status Current problems with teeth and/or dentures?: No Does patient usually wear dentures?: No  CIWA:    COWS:     Treatment Plan Summary: Daily contact with patient to assess and evaluate symptoms and progress in treatment and Medication management   Medical Decision Making:  Review of Psycho-Social Stressors (1), Review or order clinical lab tests (1), Established Problem, Worsening (2), Review of Medication Regimen & Side Effects (2) and Review of New Medication or Change in Dosage (2)   Christian [name] is an 64 year old male with a history of bipolar illness admitted for worsening of depression, psychosis, and agitated behavior.   1. Agitation. Resolved.   2. Mood and psychosis. Patient is not taking his medicine. Therefore I will give him IM injections of Zyprexa twice a day. Continue with planned for 3 times a week ECT next treatment Friday. Counseling and psychoeducation with the patient.  3. ECT. Continue ECT treatment into next week Monday Wednesday and Friday.  4. Hypertension we will continue metoprolol, Norvasc, and Hydralazine along with aspirin.  5. Dyslipidemia. He is on Lipitor.  6. Diabetes. He is on glipizide with Accu-Cheks.  7. Gout. We continue allopurinol and colchicine.  8. Edema. We will continue furosemide.   9. UTI treated on medical floor hopefully resolved.   10. Diabetic foot. Wound care consult completed. Podiatry consult.  11. Insomnia. I will add Clonazepam 1 mg qhs.  12. Disposition. To be established. He will will return to home with his wife.  Christian [name] 07/27/2015, 5:32 PM

## 2015-07-27 NOTE — Progress Notes (Signed)
Patient attempts to be cooperative, but is unable to independently do any tasks. He was med compliant at evening medications, with significant assistance (pills had to be put into his mouth and assisted with water), he noted he was unable to take his midnight medications. In the early morning hours the patient was incontinent of urine, soaking his clothes, bed, and the floor. The urine was light amber in color. The patient was unable to help with any clean up and required multiple assistants to move him and change his bed. He understands he had been incontinent, but noted no ability to get up, or urinate in the urinal. He did agree to allow a brief. The patient bed was cleaned, the patient was cleaned and barrier cream was applied to his body. He was repositioned with pillows used for correct positioning. He will continued to be repositioned at least q2 hours and he will be continually monitored with a 1:1 sitter.

## 2015-07-27 NOTE — [facility] Group Notes (Signed)
[facility] LCSW Group Therapy  07/27/2015 4:18 PM  Type of Therapy: Group Therapy  Participation Level: Did Not Attend  Modes of Intervention: Discussion, Education, Socialization and Support  Summary of Progress/Problems: Emotional Regulation: Patients will identify both negative and positive emotions. They will discuss emotions they have difficulty regulating and how they impact their lives. Patients will be asked to identify healthy coping skills to combat unhealthy reactions to negative emotions.   [employer] MSW, LCSWA  07/27/2015, 4:18 PM

## 2015-07-28 ENCOUNTER — Encounter: Payer: Self-pay | Admitting: Anesthesiology

## 2015-07-28 ENCOUNTER — Inpatient Hospital Stay: Payer: Commercial Managed Care - HMO

## 2015-07-28 LAB — GLUCOSE, CAPILLARY
Glucose-Capillary: 216 mg/dL — ABNORMAL HIGH (ref 65–99)
Glucose-Capillary: 187 mg/dL — ABNORMAL HIGH (ref 65–99)
Glucose-Capillary: 211 mg/dL — ABNORMAL HIGH (ref 65–99)
Glucose-Capillary: 250 mg/dL — ABNORMAL HIGH (ref 65–99)
Glucose-Capillary: 175 mg/dL — ABNORMAL HIGH (ref 65–99)
Glucose-Capillary: 151 mg/dL — ABNORMAL HIGH (ref 65–99)
Glucose-Capillary: 127 mg/dL — ABNORMAL HIGH (ref 65–99)
Glucose-Capillary: 102 mg/dL — ABNORMAL HIGH (ref 65–99)
Glucose-Capillary: 108 mg/dL — ABNORMAL HIGH (ref 65–99)
Glucose-Capillary: 148 mg/dL — ABNORMAL HIGH (ref 65–99)
Glucose-Capillary: 163 mg/dL — ABNORMAL HIGH (ref 65–99)
Glucose-Capillary: 126 mg/dL — ABNORMAL HIGH (ref 65–99)
Glucose-Capillary: 108 mg/dL — ABNORMAL HIGH (ref 65–99)
Glucose-Capillary: 195 mg/dL — ABNORMAL HIGH (ref 65–99)

## 2015-07-28 LAB — VITAMIN B12: Vitamin B-12: 1176 pg/mL — ABNORMAL HIGH (ref 180–914)

## 2015-07-28 MED ORDER — LABETALOL HCL 5 MG/ML IV SOLN: 20.0000 mg | Freq: Once | INTRAVENOUS | Status: DC

## 2015-07-28 MED ORDER — SUCCINYLCHOLINE CHLORIDE 20 MG/ML IJ SOLN: 120.0000 mg | Freq: Once | INTRAMUSCULAR | Status: DC

## 2015-07-28 MED ORDER — LIDOCAINE HCL (CARDIAC) 20 MG/ML IV SOLN: 4.0000 mg | Freq: Once | INTRAVENOUS | Status: DC

## 2015-07-28 MED ORDER — KETAMINE HCL 10 MG/ML IJ SOLN: 120.0000 mg | Freq: Once | INTRAMUSCULAR | Status: DC

## 2015-07-28 MED ORDER — SODIUM CHLORIDE 0.9 % IV SOLN: 250.0000 mL | Freq: Once | INTRAVENOUS | Status: DC

## 2015-07-28 NOTE — Procedures (Signed)
Treatment canceled today because of impossibility of placing an intravenous line. Multiple professionals attempted to place IV line including anesthesiologist. It proved impossible. Treatment could not be continued.

## 2015-07-28 NOTE — [facility] Group Notes (Signed)
[facility] Group Notes:  (Nursing/MHT/Case Management/Adjunct)  Date:  07/28/2015  Time:  4:42 PM  Type of Therapy:  Group Therapy  Participation Level:  Did Not Attend  Participation Qual  07/28/2015, 4:42 PM

## 2015-07-28 NOTE — Progress Notes (Addendum)
D: Pt is awake and active in the milieu this evening. Pt mood is depressed/sad and his affect is irritable. Pt denies SI/HI and AVH at this time. Sitter is ensuring pt changes position q2hrs. Pt eye contact is brief and he avoids interaction with staff. Pt still refuses to get out of bed.  A: Writer provided emotional support and administered medications as prescribed.   R: Pt is very despondent but taking medications as prescribed. 1:1 observation is ongoing for safety. No episodes of incontinence so far this evening.   Pt is refusing b/p medications in the morning prior to ECT. Writer explained the importance and attempted to convince pt to take them but he adimately refused.

## 2015-07-28 NOTE — Progress Notes (Signed)
Patient ID: Christian [name], male   DOB: 1951/09/02, 64 y.o.   MRN: [id number]  [facility] PROGRESS NOTE  PCP: Leanor Rubenstein, MD  HPI/Subjective:  Unable to have ECT treatment earlier today due to lack of IV access. Patient continues to not eat or drink           Objective: Filed Vitals:   07/28/15 0836  BP: 131/76  Pulse: 95  Temp: 98.4 F (36.9 C)  Resp: 18   ROS: Review of Systems  Unable to perform ROS  secondary to severe depression Exam: Physical Exam  HENT:  Nose: No mucosal edema.  Mouth/Throat: No oropharyngeal exudate or posterior oropharyngeal edema.  Eyes: Conjunctivae, EOM and lids are normal. Pupils are equal, round, and reactive to light.  Neck: No JVD present. Carotid bruit is not present. No edema present. No thyroid mass and no thyromegaly present.  Cardiovascular: S1 normal and S2 normal.  Tachycardia present.  Exam reveals no gallop.   No murmur heard. Pulses:      Dorsalis pedis pulses are 2+ on the right side, and 2+ on the left side.  Respiratory: No respiratory distress. He has no wheezes. He has no rhonchi. He has no rales.  GI: Soft. Bowel sounds are normal. There is no tenderness.  Musculoskeletal:       Right ankle: He exhibits swelling.       Left ankle: He exhibits swelling.  Lymphadenopathy:    He has no cervical adenopathy.  Neurological: He is alert. No cranial nerve deficit.  Skin: Skin is warm. No rash noted. Nails show no clubbing.  Large Left medial foot blood blister with skin intact.  Psychiatric: His affect is blunt.    Data Reviewed: Basic Metabolic Panel:  Recent Labs Lab 07/23/15 1249 07/23/15 1250 07/25/15 2344  NA 138  --  144  K 4.5  --  4.7  CL 103  --  104  CO2 25  --  30  GLUCOSE 227*  --  186*  BUN 21*  --  22*  CREATININE 1.27*  --  1.34*  CALCIUM 8.7*  --  9.5  MG  --  1.8  --    CBC:  Recent Labs Lab 07/25/15 2344  WBC 6.9  HGB 17.0  HCT 51.5  MCV 91.2  PLT 257     Scheduled Meds: . sodium chloride  250 mL Intravenous Once  . sodium chloride  250 mL Intravenous Once  . allopurinol  300 mg Oral Daily  . aspirin  81 mg Oral Daily  . clonazePAM  1 mg Oral QHS  . colchicine  0.6 mg Oral Daily  . diltiazem  120 mg Oral Daily  . feeding supplement (GLUCERNA SHAKE)  237 mL Oral TID WC  . hydrALAZINE  25 mg Oral 4 times per day  . insulin aspart  0-5 Units Subcutaneous QHS  . insulin aspart  0-9 Units Subcutaneous TID WC  . ketamine  120 mg Intravenous Once  . labetalol  20 mg Intravenous Once  . levofloxacin  750 mg Oral Daily  . lidocaine (cardiac) 100 mg/74ml  4 mg Intravenous Once  . megestrol  400 mg Oral Daily  . metoprolol succinate  200 mg Oral Daily  . mirtazapine  30 mg Oral QHS  . OLANZapine  10 mg Intramuscular BID AC  . sodium chloride  1,000 mL Intravenous Once  . succinylcholine  120 mg Intravenous Once  . traZODone  25 mg Oral TID  Assessment/Plan:  1. Fever-  likely due to ECT is afebrile WBC count normal test x-ray negative stop anabiotic's 2. Dehydration, acute on chronic kidney disease stage III.-encourage for fluid intake. 3. Intact blood Blister left heel- skin intact, no further treatment. Keep covered. 4. Essential hypertension, tachycardia- continue metoprolol,Cardizem CD  And hydralazine. 4. Diabetes type 2- sliding-scale only since the patient not eating much. 5. Hyperlipidemia unspecified- hold atorvastatin 6. History of gout on allopurinol and colchicine. 7. Severe depression- psychiatry managing with medications and ECT.  Code Status:     Code Status Orders        Start     Ordered   07/16/15 1213  Full code   Continuous     07/16/15 1213     Disposition Plan: To be determined  Time spent: 20 minutes  Aison Malveaux, [facility]  [facility]

## 2015-07-28 NOTE — Progress Notes (Signed)
[facility] MD Progress Note  07/28/2015 6:58 PM Saurav Sharer Wieseler  MRN:  [id number]  Subjective:  Mr. Hewitt is depressed, despondent, and negative. He refused food. He took medications with much encouragement. He refuses to get out of bed.  Principal Problem: Bipolar disorder, current episode depressed, severe, with psychotic features Diagnosis:   Patient Active Problem List   Diagnosis Date Noted  . Bipolar disorder, current episode depressed, severe, with psychotic features [F31.5] 07/09/2015  . Diabetes [E11.9] 07/09/2015  . UTI (urinary tract infection) [N39.0] 06/19/2014  . Positive blood culture [R78.81] 06/19/2014  . Bipolar affective disorder, current episode manic with psychotic symptoms [F31.2] 06/17/2014  . Type II or unspecified type diabetes mellitus with unspecified complication, uncontrolled [E11.8] 06/02/2014  . Other and unspecified hyperlipidemia [E78.5] 06/02/2014  . OSA on CPAP [G47.33] 06/02/2014  . 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 Gout [M10.9] 06/02/2014  . Hypertension [I10]    Total Time spent with patient: 20 minutes   Past Medical History:  Past Medical History  Diagnosis Date  . Mood swings   . Type II or unspecified type diabetes mellitus with unspecified complication, uncontrolled 06/02/2014  . Other and unspecified hyperlipidemia 06/02/2014  . OSA on CPAP 06/02/2014  . 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 Bipolar disorder   . CHF (congestive heart failure)   . CKD (chronic kidney disease), stage III   . Diabetes mellitus without complication   . Hypertension   . Gout   . Bipolar 1 disorder   . OSA on CPAP    History reviewed. No pertinent past surgical history. Family History:  Family History  Problem Relation Age of Onset  . Dementia Mother   . Arthritis Father   . Heart failure Neg Hx   . Kidney failure Neg Hx   . Cancer Neg Hx    Social History:   History  Alcohol Use No     History  Drug Use No    Social History   Social History  . Marital Status: Married    Spouse Name: N/A  . Number of Children: N/A  . Years of Education: N/A   Social History Main Topics  . Smoking status: Never Smoker   . Smokeless tobacco: None  . Alcohol Use: No  . Drug Use: No  . Sexual Activity: Not Currently   Other Topics Concern  . None   Social History Narrative   ** Merged History Encounter **       Additional History:    Sleep: Good  Appetite:  Poor   Assessment:   Musculoskeletal: Strength & Muscle Tone: within normal limits Gait & Station: normal Patient leans: N/A   Psychiatric Specialty Exam: Physical Exam  Nursing note and vitals reviewed.   Review of Systems  All other systems reviewed and are negative.   Blood pressure 125/86, pulse 101, temperature 98.1 F (36.7 C), temperature source Axillary, resp. rate 20, height 6' (1.829 m), weight 94.076 kg (207 lb 6.4 oz), SpO2 98 %.Body mass index is 28.12 kg/(m^2).  General Appearance: Casual  Eye Contact::  None  Speech:  Blocked  Volume:  Decreased  Mood:  Depressed, Hopeless and Worthless  Affect:  Flat  Thought Process:  Linear  Orientation:  Full (Time, Place, and Person)  Thought Content:  WDL  Suicidal Thoughts:  Yes.  with intent/plan  Homicidal Thoughts:  No  Memory:  Immediate;   Fair Recent;  Fair Remote;   Fair  Judgement:  Fair  Insight:  Fair  Psychomotor Activity:  Decreased  Concentration:  Fair  Recall:  [employer] of Knowledge:Fair  Language: Fair  Akathisia:  No  Handed:  Right  AIMS (if indicated):     Assets:  Communication Skills  ADL's:  Intact  Cognition: WNL  Sleep:  Number of Hours: 9     Current Medications: Current Facility-Administered Medications  Medication Dose Route Frequency Provider Last Rate Last Dose  . 0.9 %  sodium chloride infusion  250 mL Intravenous Once Audery Amel, MD   250 mL at 07/23/15 1325   . 0.9 %  sodium chloride infusion  250 mL Intravenous Once Audery Amel, MD      . acetaminophen (TYLENOL) tablet 650 mg  650 mg Oral Q6H PRN Shari Prows, MD   650 mg at 07/23/15 1956  . allopurinol (ZYLOPRIM) tablet 300 mg  300 mg Oral Daily Jolanta B Pucilowska, MD   300 mg at 07/28/15 1226  . alum & mag hydroxide-simeth (MAALOX/MYLANTA) 200-200-20 MG/5ML suspension 30 mL  30 mL Oral Q4H PRN Jolanta B Pucilowska, MD      . aspirin chewable tablet 81 mg  81 mg Oral Daily Jolanta B Pucilowska, MD   81 mg at 07/28/15 1228  . clonazePAM (KLONOPIN) tablet 1 mg  1 mg Oral QHS Shari Prows, MD   1 mg at 07/27/15 2229  . colchicine tablet 0.6 mg  0.6 mg Oral Daily Alford Highland, MD   0.6 mg at 07/28/15 1226  . diltiazem (CARDIZEM CD) 24 hr capsule 120 mg  120 mg Oral Daily Alford Highland, MD   120 mg at 07/27/15 1011  . feeding supplement (GLUCERNA SHAKE) (GLUCERNA SHAKE) liquid 237 mL  237 mL Oral TID WC Jolanta B Pucilowska, MD   237 mL at 07/28/15 1726  . hydrALAZINE (APRESOLINE) tablet 25 mg  25 mg Oral 4 times per day Alford Highland, MD   25 mg at 07/28/15 1226  . insulin aspart (novoLOG) injection 0-5 Units  0-5 Units Subcutaneous QHS Alford Highland, MD   3 Units at 07/24/15 2138  . insulin aspart (novoLOG) injection 0-9 Units  0-9 Units Subcutaneous TID WC Alford Highland, MD   2 Units at 07/28/15 1725  . ketamine (KETALAR) injection 120 mg  120 mg Intravenous Once Audery Amel, MD      . labetalol (NORMODYNE,TRANDATE) injection 20 mg  20 mg Intravenous Once Audery Amel, MD      . lidocaine (cardiac) 100 mg/27ml (XYLOCAINE) 20 MG/ML injection 2% 4 mg  4 mg Intravenous Once Audery Amel, MD      . magnesium hydroxide (MILK OF MAGNESIA) suspension 30 mL  30 mL Oral Daily PRN Shari Prows, MD   30 mL at 07/21/15 2323  . megestrol (MEGACE) 400 MG/10ML suspension 400 mg  400 mg Oral Daily Auburn Bilberry, MD   400 mg at 07/28/15 1227  . metoprolol succinate  (TOPROL-XL) 24 hr tablet 200 mg  200 mg Oral Daily Jolanta B Pucilowska, MD   200 mg at 07/27/15 1010  . mirtazapine (REMERON) tablet 30 mg  30 mg Oral QHS Shari Prows, MD   30 mg at 07/27/15 2229  . OLANZapine (ZYPREXA) injection 10 mg  10 mg Intramuscular BID AC Audery Amel, MD   10 mg at 07/28/15 1227  . ondansetron (ZOFRAN) injection 4 mg  4 mg Intravenous Once  PRN Yves Dill, MD      . sodium chloride 0.9 % bolus 1,000 mL  1,000 mL Intravenous Once Alford Highland, MD      . succinylcholine (ANECTINE) injection 120 mg  120 mg Intravenous Once Audery Amel, MD      . traZODone (DESYREL) tablet 25 mg  25 mg Oral TID Shari Prows, MD   25 mg at 07/27/15 2229    Lab Results:  Results for orders placed or performed during the hospital encounter of 07/16/15 (from the past 48 hour(s))  Glucose, capillary     Status: Abnormal   Collection Time: 07/26/15  8:35 PM  Result Value Ref Range   Glucose-Capillary 151 (H) 65 - 99 mg/dL  Glucose, capillary     Status: Abnormal   Collection Time: 07/27/15  6:25 AM  Result Value Ref Range   Glucose-Capillary 127 (H) 65 - 99 mg/dL  Glucose, capillary     Status: Abnormal   Collection Time: 07/27/15  8:03 AM  Result Value Ref Range   Glucose-Capillary 102 (H) 65 - 99 mg/dL  Glucose, capillary     Status: Abnormal   Collection Time: 07/27/15 12:07 PM  Result Value Ref Range   Glucose-Capillary 108 (H) 65 - 99 mg/dL  Glucose, capillary     Status: Abnormal   Collection Time: 07/27/15  4:47 PM  Result Value Ref Range   Glucose-Capillary 148 (H) 65 - 99 mg/dL  Glucose, capillary     Status: Abnormal   Collection Time: 07/27/15  9:10 PM  Result Value Ref Range   Glucose-Capillary 163 (H) 65 - 99 mg/dL  Glucose, capillary     Status: Abnormal   Collection Time: 07/28/15  5:56 AM  Result Value Ref Range   Glucose-Capillary 126 (H) 65 - 99 mg/dL  Glucose, capillary     Status: Abnormal   Collection Time: 07/28/15 11:31 AM   Result Value Ref Range   Glucose-Capillary 108 (H) 65 - 99 mg/dL  Vitamin W09     Status: Abnormal   Collection Time: 07/28/15  2:45 PM  Result Value Ref Range   Vitamin B-12 1176 (H) 180 - 914 pg/mL    Comment: (NOTE) This assay is not validated for testing neonatal or myeloproliferative syndrome specimens for Vitamin B12 levels. Performed at [facility]     Physical Findings: AIMS: Facial and Oral Movements Muscles of Facial Expression: None, normal Lips and Perioral Area: None, normal Jaw: None, normal Tongue: None, normal,Extremity Movements Upper (arms, wrists, hands, fingers): None, normal Lower (legs, knees, ankles, toes): None, normal, Trunk Movements Neck, shoulders, hips: None, normal, Overall Severity Severity of abnormal movements (highest score from questions above): None, normal Incapacitation due to abnormal movements: None, normal Patient's awareness of abnormal movements (rate only patient's report): No Awareness, Dental Status Current problems with teeth and/or dentures?: No Does patient usually wear dentures?: No  CIWA:    COWS:     Treatment Plan Summary: Daily contact with patient to assess and evaluate symptoms and progress in treatment and Medication management   Medical Decision Making:  Review of Psycho-Social Stressors (1), Review or order clinical lab tests (1), Established Problem, Worsening (2), Review of Medication Regimen & Side Effects (2) and Review of New Medication or Change in Dosage (2)   Mr. Garlitz is an 64 year old male with a history of bipolar illness admitted for worsening of depression, psychosis, and agitated behavior.   1. Agitation. Resolved.   2. Mood and psychosis.  Patient is not taking his medicine. Therefore I will give him IM injections of Zyprexa twice a day. Continue with planned for 3 times a week ECT next treatment Friday. Counseling and psychoeducation with the patient.  3. ECT. Continue ECT treatment into  next week Monday Wednesday and Friday.  4. Hypertension we will continue metoprolol, Norvasc, and Hydralazine along with aspirin.  5. Dyslipidemia. He is on Lipitor.  6. Diabetes. He is on glipizide with Accu-Cheks.  7. Gout. We continue allopurinol and colchicine.  8. Edema. We will continue furosemide.   9. UTI treated on medical floor hopefully resolved.   10. Diabetic foot. Wound care consult completed. Podiatry consult.  11. Insomnia. I will add Clonazepam 1 mg qhs.  12. Disposition. To be established. He will will return to home with his wife.  Patient continues to take almost nothing PO and is negative and resistant. ECT could not be done today due to lack of IV access. Patient remains disorganized and without ability to self care. Refuses MRI and is resistant. Consult vascular tomorrow for access.       [name] 07/28/2015, 6:58 PM

## 2015-07-28 NOTE — Progress Notes (Signed)
Recreation Therapy Notes  Date: 08.29.16 Time: 3:00 pm Location: Craft Room  Group Topic: Self-expression  Goal Area(s) Addresses:  Patient will be able to identify a color that represents each emotion. Patient will verbalize why it is important to express emotions.  Behavioral Response: Did not attend  Intervention: The Colors Within Me  Activity: Patients were given a blank face worksheet and instructed to analyze what emotions they were feeling, pick a color for each emotion, and show how much of that emotion they felt on the face.   Education: LRT educated patients on different forms of self-expression.  Education Outcome: Patient did not attend group.  Clinical Observations/Feedback: Patient did not attend group.  Jacquelynn Cree, LRT/CTRS 07/28/2015 4:34 PM

## 2015-07-29 ENCOUNTER — Inpatient Hospital Stay
Admission: AD | Admit: 2015-07-29 | Discharge: 2015-08-27 | DRG: 885 | Payer: Commercial Managed Care - HMO | Source: Ambulatory Visit | Attending: Specialist | Admitting: Specialist

## 2015-07-29 ENCOUNTER — Inpatient Hospital Stay: Payer: Commercial Managed Care - HMO

## 2015-07-29 ENCOUNTER — Telehealth (HOSPITAL_COMMUNITY): Payer: Self-pay | Admitting: *Deleted

## 2015-07-29 DIAGNOSIS — N189 Chronic kidney disease, unspecified: Secondary | ICD-10-CM | POA: Diagnosis not present

## 2015-07-29 DIAGNOSIS — R569 Unspecified convulsions: Secondary | ICD-10-CM | POA: Diagnosis present

## 2015-07-29 DIAGNOSIS — E43 Unspecified severe protein-calorie malnutrition: Secondary | ICD-10-CM | POA: Diagnosis not present

## 2015-07-29 DIAGNOSIS — Z915 Personal history of self-harm: Secondary | ICD-10-CM | POA: Diagnosis not present

## 2015-07-29 DIAGNOSIS — N183 Chronic kidney disease, stage 3 (moderate): Secondary | ICD-10-CM | POA: Diagnosis not present

## 2015-07-29 DIAGNOSIS — F315 Bipolar disorder, current episode depressed, severe, with psychotic features: Principal | ICD-10-CM | POA: Diagnosis present

## 2015-07-29 DIAGNOSIS — G4733 Obstructive sleep apnea (adult) (pediatric): Secondary | ICD-10-CM | POA: Diagnosis present

## 2015-07-29 DIAGNOSIS — E785 Hyperlipidemia, unspecified: Secondary | ICD-10-CM | POA: Diagnosis present

## 2015-07-29 DIAGNOSIS — I129 Hypertensive chronic kidney disease with stage 1 through stage 4 chronic kidney disease, or unspecified chronic kidney disease: Secondary | ICD-10-CM | POA: Diagnosis not present

## 2015-07-29 DIAGNOSIS — M1A9XX0 Chronic gout, unspecified, without tophus (tophi): Secondary | ICD-10-CM | POA: Diagnosis not present

## 2015-07-29 DIAGNOSIS — N400 Enlarged prostate without lower urinary tract symptoms: Secondary | ICD-10-CM | POA: Diagnosis present

## 2015-07-29 DIAGNOSIS — K5900 Constipation, unspecified: Secondary | ICD-10-CM | POA: Diagnosis present

## 2015-07-29 DIAGNOSIS — M109 Gout, unspecified: Secondary | ICD-10-CM | POA: Diagnosis present

## 2015-07-29 DIAGNOSIS — I509 Heart failure, unspecified: Secondary | ICD-10-CM | POA: Diagnosis present

## 2015-07-29 DIAGNOSIS — I10 Essential (primary) hypertension: Secondary | ICD-10-CM | POA: Diagnosis not present

## 2015-07-29 DIAGNOSIS — Z794 Long term (current) use of insulin: Secondary | ICD-10-CM

## 2015-07-29 DIAGNOSIS — R627 Adult failure to thrive: Secondary | ICD-10-CM | POA: Diagnosis present

## 2015-07-29 DIAGNOSIS — Z95828 Presence of other vascular implants and grafts: Secondary | ICD-10-CM

## 2015-07-29 DIAGNOSIS — N39490 Overflow incontinence: Secondary | ICD-10-CM | POA: Diagnosis present

## 2015-07-29 DIAGNOSIS — E860 Dehydration: Secondary | ICD-10-CM | POA: Diagnosis present

## 2015-07-29 DIAGNOSIS — E109 Type 1 diabetes mellitus without complications: Secondary | ICD-10-CM | POA: Diagnosis not present

## 2015-07-29 DIAGNOSIS — E669 Obesity, unspecified: Secondary | ICD-10-CM | POA: Diagnosis not present

## 2015-07-29 DIAGNOSIS — E119 Type 2 diabetes mellitus without complications: Secondary | ICD-10-CM | POA: Diagnosis present

## 2015-07-29 DIAGNOSIS — Z6828 Body mass index (BMI) 28.0-28.9, adult: Secondary | ICD-10-CM | POA: Diagnosis not present

## 2015-07-29 DIAGNOSIS — F322 Major depressive disorder, single episode, severe without psychotic features: Secondary | ICD-10-CM | POA: Diagnosis not present

## 2015-07-29 DIAGNOSIS — T730XXA Starvation, initial encounter: Secondary | ICD-10-CM | POA: Diagnosis present

## 2015-07-29 DIAGNOSIS — I878 Other specified disorders of veins: Secondary | ICD-10-CM | POA: Diagnosis present

## 2015-07-29 DIAGNOSIS — L8990 Pressure ulcer of unspecified site, unspecified stage: Secondary | ICD-10-CM | POA: Diagnosis present

## 2015-07-29 LAB — THYROID PANEL WITH TSH
Free Thyroxine Index: 1.9 (ref 1.2–4.9)
T3 Uptake Ratio: 36 % (ref 24–39)
T4, Total: 5.2 ug/dL (ref 4.5–12.0)
TSH: 1.61 u[IU]/mL (ref 0.450–4.500)

## 2015-07-29 LAB — RPR: RPR Ser Ql: NONREACTIVE

## 2015-07-29 LAB — GLUCOSE, CAPILLARY: Glucose-Capillary: 110 mg/dL — ABNORMAL HIGH (ref 65–99)

## 2015-07-29 MED ORDER — CHOLECALCIFEROL 10 MCG (400 UNIT) PO TABS
800.0000 [IU] | ORAL_TABLET | Freq: Every day | ORAL | Status: DC
  Administered 2015-08-03 – 2015-08-27 (×19): 800 [IU] via ORAL
  Filled 2015-07-29 (×22): qty 2

## 2015-07-29 MED ORDER — COLCHICINE 0.6 MG PO TABS
0.6000 mg | ORAL_TABLET | Freq: Every day | ORAL | Status: DC
  Administered 2015-08-03 – 2015-08-27 (×20): 0.6 mg via ORAL
  Filled 2015-07-29 (×23): qty 1

## 2015-07-29 MED ORDER — CLONAZEPAM 1 MG PO TABS: 1.0000 mg | ORAL_TABLET | Freq: Every day | ORAL | Status: DC

## 2015-07-29 MED ORDER — GLUCERNA SHAKE PO LIQD
237.0000 mL | Freq: Three times a day (TID) | ORAL | Status: DC
  Administered 2015-07-30 – 2015-08-26 (×49): 237 mL via ORAL

## 2015-07-29 MED ORDER — MEGESTROL ACETATE 400 MG/10ML PO SUSP: 400.0000 mg | Freq: Every day | ORAL | Status: DC

## 2015-07-29 MED ORDER — MIRTAZAPINE 15 MG PO TABS
30.0000 mg | ORAL_TABLET | Freq: Every day | ORAL | Status: DC
  Administered 2015-07-29 – 2015-07-30 (×2): 30 mg via ORAL
  Filled 2015-07-29 (×3): qty 2

## 2015-07-29 MED ORDER — HYDRALAZINE HCL 25 MG PO TABS
25.0000 mg | ORAL_TABLET | Freq: Four times a day (QID) | ORAL | Status: DC
  Administered 2015-07-29 – 2015-08-01 (×4): 25 mg via ORAL
  Filled 2015-07-29 (×7): qty 1

## 2015-07-29 MED ORDER — ATORVASTATIN CALCIUM 10 MG PO TABS
10.0000 mg | ORAL_TABLET | Freq: Every day | ORAL | Status: DC
  Administered 2015-08-03 – 2015-08-27 (×18): 10 mg via ORAL
  Filled 2015-07-29 (×22): qty 1

## 2015-07-29 MED ORDER — ENOXAPARIN SODIUM 40 MG/0.4ML ~~LOC~~ SOLN
40.0000 mg | SUBCUTANEOUS | Status: DC
  Administered 2015-07-29 – 2015-08-26 (×21): 40 mg via SUBCUTANEOUS
  Filled 2015-07-29 (×25): qty 0.4

## 2015-07-29 MED ORDER — ALLOPURINOL 100 MG PO TABS
300.0000 mg | ORAL_TABLET | Freq: Every day | ORAL | Status: DC
  Administered 2015-08-03 – 2015-08-27 (×20): 300 mg via ORAL
  Filled 2015-07-29 (×4): qty 3
  Filled 2015-07-29: qty 2
  Filled 2015-07-29 (×21): qty 3

## 2015-07-29 MED ORDER — MEGESTROL ACETATE 400 MG/10ML PO SUSP
400.0000 mg | Freq: Every day | ORAL | Status: DC
  Administered 2015-08-06 – 2015-08-27 (×18): 400 mg via ORAL
  Filled 2015-07-29 (×12): qty 10
  Filled 2015-07-29: qty 70
  Filled 2015-07-29 (×11): qty 10
  Filled 2015-07-29: qty 40

## 2015-07-29 MED ORDER — ONDANSETRON HCL 4 MG/2ML IJ SOLN: 4.0000 mg | Freq: Four times a day (QID) | INTRAMUSCULAR | Status: DC | PRN

## 2015-07-29 MED ORDER — CLONAZEPAM 1 MG PO TABS
1.0000 mg | ORAL_TABLET | Freq: Every day | ORAL | Status: DC
  Administered 2015-07-29 – 2015-08-26 (×23): 1 mg via ORAL
  Filled 2015-07-29 (×25): qty 1

## 2015-07-29 MED ORDER — ACETAMINOPHEN 325 MG PO TABS
650.0000 mg | ORAL_TABLET | Freq: Four times a day (QID) | ORAL | Status: DC | PRN
  Administered 2015-07-31: 05:00:00 650 mg via ORAL
  Filled 2015-07-29 (×3): qty 2

## 2015-07-29 MED ORDER — OLANZAPINE 10 MG IM SOLR: 10.0000 mg | Freq: Two times a day (BID) | INTRAMUSCULAR | Status: DC

## 2015-07-29 MED ORDER — ACETAMINOPHEN 650 MG RE SUPP: 650.0000 mg | Freq: Four times a day (QID) | RECTAL | Status: DC | PRN

## 2015-07-29 MED ORDER — TRAZODONE HCL 50 MG PO TABS
25.0000 mg | ORAL_TABLET | Freq: Three times a day (TID) | ORAL | Status: DC
  Administered 2015-07-29 – 2015-08-03 (×3): 25 mg via ORAL
  Administered 2015-08-03: 21:00:00 via ORAL
  Administered 2015-08-03 – 2015-08-27 (×54): 25 mg via ORAL
  Filled 2015-07-29 (×9): qty 1
  Filled 2015-07-29: qty 2
  Filled 2015-07-29 (×24): qty 1
  Filled 2015-07-29 (×2): qty 2
  Filled 2015-07-29 (×6): qty 1
  Filled 2015-07-29: qty 2
  Filled 2015-07-29 (×7): qty 1
  Filled 2015-07-29: qty 2
  Filled 2015-07-29 (×14): qty 1

## 2015-07-29 MED ORDER — METOPROLOL SUCCINATE ER 50 MG PO TB24
200.0000 mg | ORAL_TABLET | Freq: Every day | ORAL | Status: DC
  Administered 2015-08-03: 200 mg via ORAL
  Filled 2015-07-29 (×6): qty 4

## 2015-07-29 MED ORDER — DILTIAZEM HCL ER COATED BEADS 120 MG PO CP24: 120.0000 mg | ORAL_CAPSULE | Freq: Every day | ORAL | Status: DC

## 2015-07-29 MED ORDER — TRAZODONE HCL 50 MG PO TABS: 25.0000 mg | ORAL_TABLET | Freq: Three times a day (TID) | ORAL | Status: DC

## 2015-07-29 MED ORDER — GLUCERNA SHAKE PO LIQD: 237.0000 mL | Freq: Three times a day (TID) | ORAL | Status: DC

## 2015-07-29 MED ORDER — ONDANSETRON HCL 4 MG PO TABS: 4.0000 mg | ORAL_TABLET | Freq: Four times a day (QID) | ORAL | Status: DC | PRN

## 2015-07-29 MED ORDER — OLANZAPINE 10 MG IM SOLR
10.0000 mg | Freq: Two times a day (BID) | INTRAMUSCULAR | Status: DC
  Administered 2015-07-31 – 2015-08-13 (×16): 10 mg via INTRAMUSCULAR
  Filled 2015-07-29 (×39): qty 10

## 2015-07-29 MED ORDER — SODIUM CHLORIDE 0.9 % IV SOLN
INTRAVENOUS | Status: DC
  Administered 2015-07-29 – 2015-08-24 (×28): via INTRAVENOUS

## 2015-07-29 MED ORDER — HYDRALAZINE HCL 25 MG PO TABS: 25.0000 mg | ORAL_TABLET | Freq: Four times a day (QID) | ORAL | Status: DC

## 2015-07-29 NOTE — Progress Notes (Signed)
Patient remained in bed, anxious and restless, sitter at bedside. Medication were taken to him in room. Patient took his bedtime medications as scheduled. Had a snack per nurse's encouragements.  Emotional support and encouragements offered and safety maintained.

## 2015-07-29 NOTE — Plan of Care (Signed)
Problem: Ineffective individual coping Goal: LTG: Patient will report a decrease in negative feelings Outcome: Not Progressing Patient remains anxious and irritable, often not compliant with medications

## 2015-07-29 NOTE — Progress Notes (Signed)
Report given to [facility] on Medical unit. Discharge patient and transfer to room 122. Patient with no acute distress/no discomfort. Safety maintained. Falls protocol and 1:1 with staff maintained.

## 2015-07-29 NOTE — Progress Notes (Signed)
Recreation Therapy Notes  Date: 08.30.16 Time: 3:00 pm Location: Craft Room  Group Topic: Coping Skills  Goal Area(s) Addresses:  Patients will verbalize emotions related to their recovery. Patients will write down healthy coping skills.  Behavioral Response: Did not attend  Intervention: Emotion Wheel  Activity: Patients were given a worksheet with 8 sections and instructed to come up as a group with 8 emotions related to recovery. Patients were instructed to list healthy coping skills.  Education: LRT educated patients on healthy coping skills   Education Outcome: Patient did not attend group.  Clinical Observations/Feedback: Patient did not attend group.  Jacquelynn Cree, LRT/CTRS 07/29/2015 4:17 PM

## 2015-07-29 NOTE — Plan of Care (Signed)
Problem: Diagnosis: Increased Risk For Suicide Attempt Goal: LTG-Patient Will Show Positive Response to Medication LTG (by discharge) : Patient will show positive response to medication and will participate in the development of the discharge plan.  Outcome: Not Progressing Patient remains non compliant with medications. Minimal response to nursing interventions

## 2015-07-29 NOTE — Plan of Care (Addendum)
Problem: Discharge Progression Outcomes Goal: Discharge plan in place and appropriate Outcome: Progressing 1. Transferred from behavioral med for treatment of severe depression with pt not eating/drinking/not voiding with inability to obtain IV access for IVF's. Needs PICC line. Discharge plans are for pt to return with wife. 2. IVC by wife for treatment of severe depression with ECT treatments being done- unable to get IV access for next planned treatment. 3. HIGH risk for fall/injury: sitter 1:1, bed alarm, keep personal items close at hand, protective footwear, offer toileting every hour WA. 4. PMH- DM, CKD, CHF, HTN, gout controlled by home meds/diet.

## 2015-07-29 NOTE — Progress Notes (Signed)
Patient ID: Christian [name], male   DOB: 07/31/1951, 64 y.o.   MRN: [id number]  [facility] PROGRESS NOTE  PCP: Leanor Rubenstein, MD  HPI/Subjective:  No new changes blood pressure stable           Objective: Filed Vitals:   07/29/15 0628  BP: 139/97  Pulse: 107  Temp: 98.1 F (36.7 C)  Resp: 20   ROS: Review of Systems  Unable to perform ROS  secondary to severe depression Exam: Physical Exam  HENT:  Nose: No mucosal edema.  Mouth/Throat: No oropharyngeal exudate or posterior oropharyngeal edema.  Eyes: Conjunctivae, EOM and lids are normal. Pupils are equal, round, and reactive to light.  Neck: No JVD present. Carotid bruit is not present. No edema present. No thyroid mass and no thyromegaly present.  Cardiovascular: S1 normal and S2 normal.  Tachycardia present.  Exam reveals no gallop.   No murmur heard. Pulses:      Dorsalis pedis pulses are 2+ on the right side, and 2+ on the left side.  Respiratory: No respiratory distress. He has no wheezes. He has no rhonchi. He has no rales.  GI: Soft. Bowel sounds are normal. There is no tenderness.  Musculoskeletal:       Right ankle: He exhibits swelling.       Left ankle: He exhibits swelling.  Lymphadenopathy:    He has no cervical adenopathy.  Neurological: He is alert. No cranial nerve deficit.  Skin: Skin is warm. No rash noted. Nails show no clubbing.  Large Left medial foot blood blister with skin intact.  Psychiatric: His affect is blunt.    Data Reviewed: Basic Metabolic Panel:  Recent Labs Lab 07/23/15 1249 07/23/15 1250 07/25/15 2344  NA 138  --  144  K 4.5  --  4.7  CL 103  --  104  CO2 25  --  30  GLUCOSE 227*  --  186*  BUN 21*  --  22*  CREATININE 1.27*  --  1.34*  CALCIUM 8.7*  --  9.5  MG  --  1.8  --    CBC:  Recent Labs Lab 07/25/15 2344  WBC 6.9  HGB 17.0  HCT 51.5  MCV 91.2  PLT 257    Scheduled Meds: . sodium chloride  250 mL Intravenous Once  . sodium  chloride  250 mL Intravenous Once  . allopurinol  300 mg Oral Daily  . aspirin  81 mg Oral Daily  . clonazePAM  1 mg Oral QHS  . colchicine  0.6 mg Oral Daily  . diltiazem  120 mg Oral Daily  . feeding supplement (GLUCERNA SHAKE)  237 mL Oral TID WC  . hydrALAZINE  25 mg Oral 4 times per day  . insulin aspart  0-5 Units Subcutaneous QHS  . insulin aspart  0-9 Units Subcutaneous TID WC  . ketamine  120 mg Intravenous Once  . labetalol  20 mg Intravenous Once  . lidocaine (cardiac) 100 mg/11ml  4 mg Intravenous Once  . megestrol  400 mg Oral Daily  . metoprolol succinate  200 mg Oral Daily  . mirtazapine  30 mg Oral QHS  . OLANZapine  10 mg Intramuscular BID AC  . sodium chloride  1,000 mL Intravenous Once  . succinylcholine  120 mg Intravenous Once  . traZODone  25 mg Oral TID    Assessment/Plan:  1. Fever-  likely due to ECT is afebrile WBC count normal test x-ray negative anabiotic stopped 2.  Dehydration, acute on chronic kidney disease stage III.-encourage for fluid intake. 3. Essential hypertension, tachycardia- continue metoprolol,Cardizem CD  And hydralazine. 4. Diabetes type 2- sliding-scale only since the patient not eating much. 5. Hyperlipidemia unspecified- hold atorvastatin 6. History of gout on allopurinol and colchicine. 7. Severe depression- psychiatry managing with medications and ECT.  Code Status:     Code Status Orders        Start     Ordered   07/16/15 1213  Full code   Continuous     07/16/15 1213     Disposition Plan: To be determined  Time spent: 20 minutes  Sharday Michl, [facility]  [facility]

## 2015-07-29 NOTE — Progress Notes (Signed)
Patient resting in bed. Skin clean, dry and intact. Patient verbally responsive with cares. Patient eats 1/3 of banana for lunch and drinks 100 ml of Mighty Shake. Wife calls and requests to visit patient. MD paged. Patient refuses to eat any additional lunch stating "leave me alone". Remains 1:1 with staff. Refuses to toilet in bathroom. Turns and repositions self. Safety maintained.

## 2015-07-29 NOTE — Progress Notes (Signed)
No insulin to be administered at this time per MD, rt poor appetite. No s/s of hypo/hyperglycemia.

## 2015-07-29 NOTE — [facility] Group Notes (Signed)
[facility] Group Notes:  (Nursing/MHT/Case Management/Adjunct)  Date:  07/29/2015  Time:  2:35 PM  Type of Therapy:  Psychoeducational Skills  Participation Level:  Did Not Attend   Lynelle Smoke [facility] 07/29/2015, 2:35 PM

## 2015-07-29 NOTE — Progress Notes (Signed)
Pt could not go to ETC, as not able to get IV access.  We need to transfer to medical floor to get PICC line.  I spoke to consulting physician Dr. Auburn Bilberry. We will admit to medical floor and get PICC line.

## 2015-07-29 NOTE — Plan of Care (Signed)
Problem: Diagnosis: Increased Risk For Suicide Attempt Goal: STG-Patient Will Attend All Groups On The Unit Outcome: Not Progressing Patient stays in bed, unable to participate in unit activities

## 2015-07-29 NOTE — Progress Notes (Signed)
Patient alert and oriented to name only. Sleeping in bed and rousable for meds with applesauce. Takes meds with encouragement. Drinks Strawberry mighty shake with encouragement. Will answer No to most questions. Needs support and encouragement from staff.  Good adls. Not participating in plan of care. Remains 1:1 of staff. Refuses MRI at this time. Repositions self and returns to sleep after meds. Safety maintained.

## 2015-07-29 NOTE — Telephone Encounter (Signed)
Called for authorization of inpatient ECT. Spoke to erica who stated  no authorization is required. All authorizations are based on medical necessity.

## 2015-07-29 NOTE — Progress Notes (Addendum)
Dr. Cherlynn Kaiser spoke with wife on phone with TC consent obtained for PICC line with referral being placed. Pt resting quietly with sitter at bedside. States he feels bad all over.

## 2015-07-29 NOTE — H&P (Signed)
[facility] - [place] at [facility]   PATIENT NAME: Christian [name]    MR#:  [id number]  DATE OF BIRTH:  Nov 27, 1951  DATE OF ADMISSION:  07/29/2015  PRIMARY CARE PHYSICIAN: Leanor Rubenstein, MD   REQUESTING/REFERRING PHYSICIAN: Dr. Toni Amend  CHIEF COMPLAINT:  No chief complaint on file.  severe depression poor by mouth intake  HISTORY OF PRESENT ILLNESS:  Christian [name]  is a 64 y.o. male with a known history of severe depression, diabetes, chronic kidney disease stage III, history of CHF, bipolar disorder, hypertension, gout, obstructive sleep apnea, who was actually in behavioral medicine for treatment for his underlying depression but is transferred to the medical floor as he was unable to get IV access on the patient to start ECT. Patient was supposed to get ECT but due to poor IV access could not be initiated. Patient likely needs a PICC line but that cannot be done while the patient is in behavioral medicine and therefore was transferred to the medical floor. Patient has severe depression and therefore is a very poor historian.  PAST MEDICAL HISTORY:   Past Medical History  Diagnosis Date  . Mood swings   . Type II or unspecified type diabetes mellitus with unspecified complication, uncontrolled 06/02/2014  . Other and unspecified hyperlipidemia 06/02/2014  . OSA on CPAP 06/02/2014  . 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 Bipolar disorder   . CHF (congestive heart failure)   . CKD (chronic kidney disease), stage III   . Diabetes mellitus without complication   . Hypertension   . Gout   . Bipolar 1 disorder   . OSA on CPAP     PAST SURGICAL HISTORY:  History reviewed. No pertinent past surgical history.  SOCIAL HISTORY:   Social History  Substance Use Topics  . Smoking status: Never Smoker   . Smokeless tobacco: Not on file  . Alcohol Use: No    FAMILY HISTORY:   Family History  Problem Relation Age of  Onset  . Dementia Mother   . Arthritis Father   . Heart failure Neg Hx   . Kidney failure Neg Hx   . Cancer Neg Hx     DRUG ALLERGIES:  No Known Allergies  REVIEW OF SYSTEMS:   Review of Systems  Unable to perform ROS  due to severe depression  MEDICATIONS AT HOME:   Prior to Admission medications   Medication Sig Start Date End Date Taking? Authorizing Provider  allopurinol (ZYLOPRIM) 300 MG tablet Take 1 tablet (300 mg total) by mouth daily. For gout 07/15/14   Beau Fanny, FNP  amLODipine (NORVASC) 10 MG tablet Take 10 mg by mouth daily. 06/23/15   Historical Provider, MD  aspirin EC 81 MG tablet Take 81 mg by mouth daily.    Historical Provider, MD  atorvastatin (LIPITOR) 10 MG tablet Take 1 tablet (10 mg total) by mouth daily. For high cholesterol 06/05/14   Sanjuana Kava, NP  cholecalciferol (VITAMIN D) 400 UNITS TABS tablet Take 800 Units by mouth daily.    Historical Provider, MD  clonazePAM (KLONOPIN) 1 MG tablet Take 1 tablet (1 mg total) by mouth at bedtime. 07/29/15   Jimmy Footman, MD  colchicine 0.6 MG tablet Take 1 tablet (0.6 mg total) by mouth 2 (two) times daily. For Gout pain Patient taking differently: Take 0.6 mg by mouth daily. For Gout pain 06/05/14   Sanjuana Kava, NP  diltiazem (CARDIZEM CD) 120 MG  24 hr capsule Take 1 capsule (120 mg total) by mouth daily. 07/29/15   Jimmy Footman, MD  feeding supplement, GLUCERNA SHAKE, (GLUCERNA SHAKE) LIQD Take 237 mLs by mouth 3 (three) times daily with meals. 07/29/15   Jimmy Footman, MD  furosemide (LASIX) 20 MG tablet Take 20 mg by mouth daily. 06/09/15   Historical Provider, MD  hydrALAZINE (APRESOLINE) 25 MG tablet Take 1 tablet (25 mg total) by mouth every 6 (six) hours. 07/29/15   Jimmy Footman, MD  megestrol (MEGACE) 400 MG/10ML suspension Take 10 mLs (400 mg total) by mouth daily. 07/29/15   Jimmy Footman, MD  metoprolol (TOPROL-XL) 200 MG 24 hr tablet Take 1  tablet (200 mg total) by mouth daily. For high blood pressure 06/05/14   Sanjuana Kava, NP  mirtazapine (REMERON) 30 MG tablet Take 30 mg by mouth at bedtime.    Historical Provider, MD  OLANZapine (ZYPREXA) injection Inject 10 mg into the muscle 2 (two) times daily before a meal. 07/29/15   Jimmy Footman, MD  traZODone (DESYREL) 50 MG tablet Take 0.5 tablets (25 mg total) by mouth 3 (three) times daily. 07/29/15   Jimmy Footman, MD      VITAL SIGNS:  Blood pressure 138/82, pulse 87, temperature 98.1 F (36.7 C), temperature source Oral, resp. rate 18, height 6' (1.829 m), weight 88.95 kg (196 lb 1.6 oz), SpO2 100 %.  PHYSICAL EXAMINATION:  Physical Exam  GENERAL:  64 y.o.-year-old  severely depressed patient lying in the bed with no acute distress.  EYES: Pupils equal, round, reactive to light and accommodation. No scleral icterus. Extraocular muscles intact.  HEENT: Head atraumatic, normocephalic. Oropharynx and nasopharynx clear. No oropharyngeal erythema, dry  oral mucosa  NECK:  Supple, no jugular venous distention. No thyroid enlargement, no tenderness.  LUNGS: Normal breath sounds bilaterally, no wheezing, rales, rhonchi. No use of accessory muscles of respiration.  CARDIOVASCULAR: S1, S2 RRR. No murmurs, rubs, gallops, clicks.  ABDOMEN: Soft, nontender, nondistended. Bowel sounds present. No organomegaly or mass.  EXTREMITIES: No pedal edema, cyanosis, or clubbing. + 2 pedal & radial pulses b/l.   NEUROLOGIC: Cranial nerves II through XII are intact. No focal Motor or sensory deficits appreciated b/l PSYCHIATRIC: The patient is alert and oriented x 1. Poor affect and makes no eye contact. Severely depressed. Anhedonia.  SKIN: No obvious rash, lesion, or ulcer.   LABORATORY PANEL:   CBC  Recent Labs Lab 07/25/15 2344  WBC 6.9  HGB 17.0  HCT 51.5  PLT 257    ------------------------------------------------------------------------------------------------------------------  Chemistries   Recent Labs Lab 07/23/15 1250 07/25/15 2344  NA  --  144  K  --  4.7  CL  --  104  CO2  --  30  GLUCOSE  --  186*  BUN  --  22*  CREATININE  --  1.34*  CALCIUM  --  9.5  MG 1.8  --    ------------------------------------------------------------------------------------------------------------------  Cardiac Enzymes No results for input(s): TROPONINI in the last 168 hours. ------------------------------------------------------------------------------------------------------------------  RADIOLOGY:  No results found.   IMPRESSION AND PLAN:   65 year old male with past medical history of severe depression, CTD stage III, hypertension, gout, anxiety, hyperlipidemia, who was transferred from behavioral medicine to the medical floor for dehydration and poor by mouth intake. Patient had poor IV access and needs a PICC line which could not be done in the behavioral medicine unit.  #1 dehydration-likely due to poor by mouth intake and severe depression. -We'll get a PICC line  placed and start patient on IV fluids.  #2 severe depression with anhedonia-patient likely needs ECT but they could not get IV access. -I will get a PICC line. Continue care as per psychiatry. Continue IVC, continue sitter.  #3 history of gout-continue allopurinol, colchicine. No acute attack.  #4 hypertension-continue hydralazine, metoprolol.  #5 hyperlipidemia-continue atorvastatin.    All the records are reviewed and case discussed with ED provider. Management plans discussed with the patient, family and they are in agreement.  CODE STATUS: Full   TOTAL TIME TAKING CARE OF THIS PATIENT: 50  minutes.    Houston Siren M.D on 07/29/2015 at 8:30 PM  Between 7am to 6pm - Pager - [phone]  After 6pm go to [email or web address] - password [email or web address] [facility]  [facility]  Hospitalists  Office  [phone]  CC: Primary care physician; Leanor Rubenstein, MD

## 2015-07-30 ENCOUNTER — Other Ambulatory Visit: Payer: Self-pay

## 2015-07-30 ENCOUNTER — Inpatient Hospital Stay: Payer: Commercial Managed Care - HMO | Admitting: Anesthesiology

## 2015-07-30 ENCOUNTER — Inpatient Hospital Stay: Payer: Commercial Managed Care - HMO

## 2015-07-30 ENCOUNTER — Encounter: Payer: Self-pay | Admitting: *Deleted

## 2015-07-30 DIAGNOSIS — L8990 Pressure ulcer of unspecified site, unspecified stage: Secondary | ICD-10-CM | POA: Insufficient documentation

## 2015-07-30 DIAGNOSIS — F315 Bipolar disorder, current episode depressed, severe, with psychotic features: Principal | ICD-10-CM

## 2015-07-30 DIAGNOSIS — E43 Unspecified severe protein-calorie malnutrition: Secondary | ICD-10-CM | POA: Insufficient documentation

## 2015-07-30 LAB — CBC
HCT: 47.2 % (ref 40.0–52.0)
Hemoglobin: 15.4 g/dL (ref 13.0–18.0)
MCH: 29.7 pg (ref 26.0–34.0)
MCHC: 32.7 g/dL (ref 32.0–36.0)
MCV: 90.7 fL (ref 80.0–100.0)
Platelets: 197 10*3/uL (ref 150–440)
RBC: 5.2 MIL/uL (ref 4.40–5.90)
RDW: 12.4 % (ref 11.5–14.5)
WBC: 4.3 10*3/uL (ref 3.8–10.6)

## 2015-07-30 LAB — CREATININE, SERUM
Creatinine, Ser: 1.25 mg/dL — ABNORMAL HIGH (ref 0.61–1.24)
GFR calc Af Amer: >60 mL/min (ref >60)
GFR calc non Af Amer: >60 mL/min (ref >60)

## 2015-07-30 LAB — GLUCOSE, CAPILLARY
Glucose-Capillary: 160 mg/dL — ABNORMAL HIGH (ref 65–99)
Glucose-Capillary: 149 mg/dL — ABNORMAL HIGH (ref 65–99)
Glucose-Capillary: 193 mg/dL — ABNORMAL HIGH (ref 65–99)
Glucose-Capillary: 179 mg/dL — ABNORMAL HIGH (ref 65–99)
Glucose-Capillary: 225 mg/dL — ABNORMAL HIGH (ref 65–99)

## 2015-07-30 MED ORDER — KETAMINE HCL 10 MG/ML IJ SOLN
120.0000 mg | Freq: Once | INTRAMUSCULAR | Status: AC
  Administered 2015-07-30: 120 mg via INTRAVENOUS

## 2015-07-30 MED ORDER — SODIUM CHLORIDE 0.9 % IV SOLN
250.0000 mL | Freq: Once | INTRAVENOUS | Status: AC
  Administered 2015-07-30: 250 mL via INTRAVENOUS

## 2015-07-30 MED ORDER — LABETALOL HCL 5 MG/ML IV SOLN
20.0000 mg | Freq: Once | INTRAVENOUS | Status: AC
  Administered 2015-07-30: 20 mg via INTRAVENOUS

## 2015-07-30 MED ORDER — METOCLOPRAMIDE HCL 5 MG/ML IJ SOLN: 10.0000 mg | Freq: Once | INTRAMUSCULAR | Status: DC | PRN

## 2015-07-30 MED ORDER — INSULIN ASPART 100 UNIT/ML ~~LOC~~ SOLN
0.0000 [IU] | Freq: Three times a day (TID) | SUBCUTANEOUS | Status: DC
  Administered 2015-07-31 (×2): 2 [IU] via SUBCUTANEOUS
  Administered 2015-08-03: 13:00:00 1 [IU] via SUBCUTANEOUS
  Administered 2015-08-03: 17:00:00 3 [IU] via SUBCUTANEOUS
  Administered 2015-08-04: 2 [IU] via SUBCUTANEOUS
  Administered 2015-08-06: 13:00:00 5 [IU] via SUBCUTANEOUS
  Administered 2015-08-06: 1 [IU] via SUBCUTANEOUS
  Administered 2015-08-07: 13:00:00 3 [IU] via SUBCUTANEOUS
  Administered 2015-08-07: 19:00:00 2 [IU] via SUBCUTANEOUS
  Administered 2015-08-09: 09:00:00 1 [IU] via SUBCUTANEOUS
  Administered 2015-08-09: 17:00:00 5 [IU] via SUBCUTANEOUS
  Administered 2015-08-10: 1 [IU] via SUBCUTANEOUS
  Administered 2015-08-10 (×2): 2 [IU] via SUBCUTANEOUS
  Administered 2015-08-11: 1 [IU] via SUBCUTANEOUS
  Administered 2015-08-11: 14:00:00 2 [IU] via SUBCUTANEOUS
  Administered 2015-08-12 (×2): 5 [IU] via SUBCUTANEOUS
  Administered 2015-08-12: 1 [IU] via SUBCUTANEOUS
  Administered 2015-08-13: 3 [IU] via SUBCUTANEOUS
  Administered 2015-08-14: 7 [IU] via SUBCUTANEOUS
  Administered 2015-08-14: 2 [IU] via SUBCUTANEOUS
  Administered 2015-08-15 – 2015-08-16 (×2): 3 [IU] via SUBCUTANEOUS
  Administered 2015-08-16: 17:00:00 2 [IU] via SUBCUTANEOUS
  Administered 2015-08-16: 1 [IU] via SUBCUTANEOUS
  Administered 2015-08-17: 2 [IU] via SUBCUTANEOUS
  Administered 2015-08-17 (×2): 3 [IU] via SUBCUTANEOUS
  Administered 2015-08-18: 2 [IU] via SUBCUTANEOUS
  Administered 2015-08-18: 16:00:00 5 [IU] via SUBCUTANEOUS
  Administered 2015-08-19: 09:00:00 1 [IU] via SUBCUTANEOUS
  Administered 2015-08-19: 7 [IU] via SUBCUTANEOUS
  Administered 2015-08-19: 17:00:00 3 [IU] via SUBCUTANEOUS
  Administered 2015-08-20 (×2): 1 [IU] via SUBCUTANEOUS
  Administered 2015-08-20: 5 [IU] via SUBCUTANEOUS
  Administered 2015-08-21: 2 [IU] via SUBCUTANEOUS
  Administered 2015-08-21 (×2): 3 [IU] via SUBCUTANEOUS
  Administered 2015-08-22: 1 [IU] via SUBCUTANEOUS
  Administered 2015-08-22: 2 [IU] via SUBCUTANEOUS
  Administered 2015-08-22: 18:00:00 7 [IU] via SUBCUTANEOUS
  Administered 2015-08-23: 17:00:00 2 [IU] via SUBCUTANEOUS
  Administered 2015-08-23: 5 [IU] via SUBCUTANEOUS
  Administered 2015-08-23: 1 [IU] via SUBCUTANEOUS
  Administered 2015-08-24: 5 [IU] via SUBCUTANEOUS
  Administered 2015-08-24: 2 [IU] via SUBCUTANEOUS
  Administered 2015-08-24: 1 [IU] via SUBCUTANEOUS
  Administered 2015-08-25: 7 [IU] via SUBCUTANEOUS
  Administered 2015-08-26: 2 [IU] via SUBCUTANEOUS
  Administered 2015-08-26: 08:00:00 1 [IU] via SUBCUTANEOUS
  Administered 2015-08-26: 2 [IU] via SUBCUTANEOUS
  Administered 2015-08-27 (×2): 1 [IU] via SUBCUTANEOUS
  Filled 2015-07-30: qty 1
  Filled 2015-07-30: qty 7
  Filled 2015-07-30: qty 1
  Filled 2015-07-30: qty 3
  Filled 2015-07-30: qty 5
  Filled 2015-07-30: qty 1
  Filled 2015-07-30 (×2): qty 3
  Filled 2015-07-30: qty 5
  Filled 2015-07-30: qty 1
  Filled 2015-07-30: qty 3
  Filled 2015-07-30: qty 2
  Filled 2015-07-30: qty 1
  Filled 2015-07-30: qty 3
  Filled 2015-07-30: qty 5
  Filled 2015-07-30: qty 1
  Filled 2015-07-30 (×2): qty 2
  Filled 2015-07-30: qty 5
  Filled 2015-07-30: qty 1
  Filled 2015-07-30: qty 7
  Filled 2015-07-30: qty 2
  Filled 2015-07-30: qty 7
  Filled 2015-07-30: qty 2
  Filled 2015-07-30: qty 5
  Filled 2015-07-30: qty 1
  Filled 2015-07-30: qty 5
  Filled 2015-07-30 (×3): qty 2
  Filled 2015-07-30 (×2): qty 1
  Filled 2015-07-30: qty 2
  Filled 2015-07-30: qty 5
  Filled 2015-07-30: qty 3
  Filled 2015-07-30 (×2): qty 1
  Filled 2015-07-30: qty 5
  Filled 2015-07-30 (×5): qty 2
  Filled 2015-07-30: qty 1
  Filled 2015-07-30: qty 3
  Filled 2015-07-30: qty 1
  Filled 2015-07-30: qty 2
  Filled 2015-07-30 (×2): qty 1
  Filled 2015-07-30: qty 3
  Filled 2015-07-30: qty 2
  Filled 2015-07-30: qty 7
  Filled 2015-07-30: qty 3
  Filled 2015-07-30: qty 2

## 2015-07-30 MED ORDER — SUCCINYLCHOLINE CHLORIDE 20 MG/ML IJ SOLN
120.0000 mg | Freq: Once | INTRAMUSCULAR | Status: AC
  Administered 2015-07-30: 13:00:00 120 mg via INTRAVENOUS

## 2015-07-30 MED ORDER — HALOPERIDOL LACTATE 5 MG/ML IJ SOLN
5.0000 mg | INTRAMUSCULAR | Status: AC
  Administered 2015-07-30: 5 mg via INTRAVENOUS

## 2015-07-30 MED ORDER — HYDRALAZINE HCL 20 MG/ML IJ SOLN
10.0000 mg | Freq: Once | INTRAMUSCULAR | Status: AC
  Administered 2015-07-30: 13:00:00 10 mg via INTRAVENOUS

## 2015-07-30 MED ORDER — LIDOCAINE HCL (CARDIAC) 20 MG/ML IV SOLN
4.0000 mg | Freq: Once | INTRAVENOUS | Status: AC
  Administered 2015-07-30: 4 mg via INTRAVENOUS

## 2015-07-30 NOTE — Care Management Note (Signed)
Case Management Note  Patient Details  Name: Christian [name] MRN: [id number] Date of Birth: May 26, 1951  Subjective/Objective:     Spoke to Dr Auburn Bilberry, and pt. Will require 10 days of treatment.               Action/Plan:   Expected Discharge Date:                  Expected Discharge Plan:     In-House Referral:     Discharge planning Services     Post Acute Care Choice:    Choice offered to:     DME Arranged:    DME Agency:     HH Arranged:    HH Agency:     Status of Service:     Medicare Important Message Given:    Date Medicare IM Given:    Medicare IM give by:    Date Additional Medicare IM Given:    Additional Medicare Important Message give by:     If discussed at Long Length of Stay Meetings, dates discussed:    Additional Comments:  Berna Bue, RN 07/30/2015, 8:50 AM

## 2015-07-30 NOTE — Transfer of Care (Signed)
Immediate Anesthesia Transfer of Care Note  Patient: Christian [name]  Procedure(s) Performed: ECT  Patient Location: PACU  Anesthesia Type:General  Level of Consciousness: sedated  Airway & Oxygen Therapy: Patient Spontanous Breathing and Patient connected to face mask oxygen  Post-op Assessment: Report given to RN and Post -op Vital signs reviewed and stable  Post vital signs: Reviewed and stable  Last Vitals:  Filed Vitals:   07/30/15 1322  BP: 152/105  Pulse: 95  Temp: 37.3 C  Resp: 13    Complications: No apparent anesthesia complications

## 2015-07-30 NOTE — Progress Notes (Signed)
Patient ID: Christian [name], male   DOB: 1951-11-13, 64 y.o.   MRN: [id number]  [facility] Physicians PROGRESS NOTE  PCP: Leanor Rubenstein, MD  HPI/Subjective:  I was called by psychiatry yesterday stating that the patient needs IV access for ECT treatment. And if patient had a PICC line placed and then he would not be able to stay down in the behavioral medicine. Therefore he was transferred to the medical floor for ECT treatment today. Patient continues to not eat much by mouth.           Objective: Filed Vitals:   07/30/15 1205  BP: 148/56  Pulse: 92  Temp: 98.6 F (37 C)  Resp: 18   ROS: Review of Systems  Unable to perform ROS  secondary to severe depression patient's refuses to complaint Exam: Physical Exam  HENT:  Nose: No mucosal edema.  Mouth/Throat: No oropharyngeal exudate or posterior oropharyngeal edema.  Eyes: Conjunctivae, EOM and lids are normal. Pupils are equal, round, and reactive to light.  Neck: No JVD present. Carotid bruit is not present. No edema present. No thyroid mass and no thyromegaly present.  Cardiovascular: S1 normal and S2 normal.   present.  Exam reveals no gallop.   No murmur heard. Pulses:      Dorsalis pedis pulses are 2+ on the right side, and 2+ on the left side.  Respiratory: No respiratory distress. He has no wheezes. He has no rhonchi. He has no rales.  GI: Soft. Bowel sounds are normal. There is no tenderness.  Musculoskeletal:       Right ankle: He exhibits  no swelling       Left ankle: He exhibits no swelling.  Lymphadenopathy:    He has no cervical adenopathy.  Neurological: He is alert. No cranial nerve deficit.  Skin: Skin is warm. No rash noted. Nails show no clubbing.  Large Left medial foot blood blister with skin intact.  Psychiatric: His affect is blunt.    Data Reviewed: Basic Metabolic Panel:  Recent Labs Lab 07/23/15 1249 07/23/15 1250 07/25/15 2344 07/30/15 0025  NA 138  --  144  --   K 4.5  --   4.7  --   CL 103  --  104  --   CO2 25  --  30  --   GLUCOSE 227*  --  186*  --   BUN 21*  --  22*  --   CREATININE 1.27*  --  1.34* 1.25*  CALCIUM 8.7*  --  9.5  --   MG  --  1.8  --   --    CBC:  Recent Labs Lab 07/25/15 2344 07/30/15 0025  WBC 6.9 4.3  HGB 17.0 15.4  HCT 51.5 47.2  MCV 91.2 90.7  PLT 257 197    Scheduled Meds: . allopurinol  300 mg Oral Daily  . atorvastatin  10 mg Oral Daily  . cholecalciferol  800 Units Oral Daily  . clonazePAM  1 mg Oral QHS  . colchicine  0.6 mg Oral Daily  . enoxaparin (LOVENOX) injection  40 mg Subcutaneous Q24H  . feeding supplement (GLUCERNA SHAKE)  237 mL Oral TID WC  . hydrALAZINE  25 mg Oral 4 times per day  . ketamine  120 mg Intravenous Once  . labetalol  20 mg Intravenous Once  . lidocaine (cardiac) 100 mg/66ml  4 mg Intravenous Once  . megestrol  400 mg Oral Daily  . metoprolol  200 mg Oral Daily  .  mirtazapine  30 mg Oral QHS  . OLANZapine  10 mg Intramuscular BID AC  . succinylcholine  120 mg Intravenous Once  . traZODone  25 mg Oral TID    Assessment/Plan:  1. Severe depression: Transferred by psychiatry for IV access and further ECT treatment.   2. Dehydration, acute on chronic kidney disease stage III.-Patient on IV fluids 3. Essential hypertension,- continue metoprolol,Cardizem CD  And hydralazine. 4. Diabetes type 2- sliding-scale only since the patient not eating much. 5. Hyperlipidemia unspecified- hold atorvastatin 6. History of gout on allopurinol and colchicine. Code Status:     Code Status Orders        Start     Ordered   07/16/15 1213  Full code   Continuous     07/16/15 1213     Disposition Plan: To be determined  Time spent: 35 minutes minutes  Draper Gallon, [facility]  [facility] Hospitalists

## 2015-07-30 NOTE — Progress Notes (Signed)
Initial Nutrition Assessment  DOCUMENTATION CODES:   Severe malnutrition in context of acute illness/injury  INTERVENTION:   Meals and Snacks: Cater to patient preferences Medical Food Supplement Therapy: Continue Glucerna Shake po TID, each supplement provides 220 kcal and 10 grams of protein.   NUTRITION DIAGNOSIS:   Inadequate oral intake related to acute illness as evidenced by meal completion < 25%.  GOAL:   Patient will meet greater than or equal to 90% of their needs  MONITOR:    (Energy Intake, Electrolyte and Renal Profile, Anthropometrics, Skin)  REASON FOR ASSESSMENT:   Malnutrition Screening Tool    ASSESSMENT:   Pt admitted from behavioral health unit for PICC line placement for ECT treatments.   Past Medical History  Diagnosis Date  . Mood swings   . Type II or unspecified type diabetes mellitus with unspecified complication, uncontrolled 06/02/2014  . Other and unspecified hyperlipidemia 06/02/2014  . OSA on CPAP 06/02/2014  . 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 Bipolar disorder   . CHF (congestive heart failure)   . CKD (chronic kidney disease), stage III   . Diabetes mellitus without complication   . Hypertension   . Gout   . Bipolar 1 disorder   . OSA on CPAP      Diet Order:  Diet NPO time specified    Current Nutrition: This am pt ate 50% of meal tray including all of home fries and bacon with no cereal and bites of eggs. Pt drank 60-75% of Glucerna this am.   Food/Nutrition-Related History: Pt has been eating < 25% of documented meals from 8/22-8/30 while admitted in the [facility]. Multiple MDs have noted pt with very poor po intake. MD Hilton Sinclair documented pt would drink Mighty Shake with him present on visit at times. On admission to the hospital prior to admission to [facility], calorie count performed and RD calculated intake of 350kcals on average. Pt first admitted to [facility] 07/08/2015.   Medications: NS at  14mL/hr, Remeron, Megace, vitamin D  Electrolyte/Renal Profile and Glucose Profile:   Recent Labs Lab 07/23/15 1249 07/23/15 1250 07/25/15 2344 07/30/15 0025  NA 138  --  144  --   K 4.5  --  4.7  --   CL 103  --  104  --   CO2 25  --  30  --   BUN 21*  --  22*  --   CREATININE 1.27*  --  1.34* 1.25*  CALCIUM 8.7*  --  9.5  --   MG  --  1.8  --   --   GLUCOSE 227*  --  186*  --    Protein Profile: No results for input(s): ALBUMIN in the last 168 hours.  Gastrointestinal Profile: Last BM: last documented 07/20/2015   Nutrition-Focused Physical Exam Findings:  Unable to complete Nutrition-Focused physical exam at this time as pt reports not wanting assessment performed on visit.    Weight Change: Pt with weight loss of 7% since 07/09/2015 when first admitted to [facility] and 12% since weight last month. Per [facility] clear decrease in weight over the past year as well.   Skin:   (Deep tissure injury to left heel)  Height:   Ht Readings from Last 1 Encounters:  07/30/15 6' (1.829 m)    Weight:   Wt Readings from Last 1 Encounters:  07/30/15 207 lb (93.895 kg)    Wt Readings from Last 10 Encounters:  07/30/15 207 lb (93.895  kg)  07/28/15 207 lb 6.4 oz (94.076 kg)  07/12/15 225 lb 8 oz (102.286 kg)  07/09/15 222 lb (100.699 kg)  07/08/15 243 lb (110.224 kg)  06/28/15 234 lb (106.142 kg)  07/16/14 258 lb (117.028 kg)  06/19/14 270 lb 15.1 oz (122.9 kg)  05/25/14 282 lb (127.914 kg)    BMI:  Body mass index is 28.07 kg/(m^2).  Estimated Nutritional Needs:   Kcal:  2346-2773kcals, BEE: 1778kcals, TEE: (IF 1.1-1.3)(AF 1.2)   Protein:  94-113g protein (1.0-1.2g/kg)   Fluid:  2350-2855mL of fluid (25-32mL/kg)  EDUCATION NEEDS:    Education needs no appropriate at this time   HIGH Care Level  Leda Quail, RD, LDN Pager [phone]

## 2015-07-30 NOTE — Anesthesia Postprocedure Evaluation (Signed)
  Anesthesia Post-op Note  Patient: Christian [name]  Procedure(s) Performed: * No procedures listed *  Anesthesia type:General  Patient location: PACU  Post pain: Pain level controlled  Post assessment: Post-op Vital signs reviewed, Patient's Cardiovascular Status Stable, Respiratory Function Stable, Patent Airway and No signs of Nausea or vomiting  Post vital signs: Reviewed and stable  Last Vitals:  Filed Vitals:   07/30/15 1205  BP: 148/56  Pulse: 92  Temp: 37 C  Resp: 18    Level of consciousness: awake, alert  and patient cooperative  Complications: No apparent anesthesia complications

## 2015-07-30 NOTE — Procedures (Addendum)
ECT SERVICES Physician's Interval Evaluation & Treatment Note  Patient Identification: Christian [name] MRN:  [id number] Date of Evaluation:  07/30/2015 TX #: 3  MADRS:   MMSE:   P.E. Findings:  Patient remains very withdrawn. He eats or drinks almost nothing spontaneously. Refuses medication. Blood pressure running high. Has not voided in over 24 hours.  Psychiatric Interval Note:  Patient is emotionally empty and flat and at times looks like he is emotionally pained. When he vocalizes it is always negativity and usually disorganized. Not eating or drinking. Not quite catatonic but withdrawn and depressed to an extraordinary degree. Has expressed believes that he is at fault for everything bad in the world. Clearly seems to be wishing to die.  Subjective:  Patient is a 64 y.o. male seen for evaluation for Electroconvulsive Therapy. Patient himself has no subjective statements. Almost all that he will ever say is "leave me alone"  Treatment Summary:   []   Right Unilateral             []  Bilateral   % Energy : 1.0 ms 100%   Impedance: 760 ohms  Seizure Energy Index: 1256 V squared  Postictal Suppression Index: 66%  Seizure Concordance Index: 97% Xylocaine 4 mg, labetalol 20 mg, ketamine 120 mg, succinylcholine 120 mg  Medications  Pre Shock: Xylocaine 4 mg, labetalol 20 mg, ketamine 120 mg, succinylcholine 120 mg  Post Shock: Labetalol 20 mg, hydralazine 10 mg  Seizure Duration: 15 seconds by EMG, 41 seconds by EEG Comments: Patient appears to of tolerated treatment well. He remains extremely depressed. I appreciate greatly the assistance from the medicine service in moving him to the medicine floor so that we could get a intravenous line placed. We will continue daily follow-up. If it Looks like we may be able to do treatment with him on the psychiatry floor we will certainly transfer him back there. Continue monitoring blood pressures continue trying to get him to take  by mouth.   Lungs:  [x]   Clear to auscultation               []  Other:   Heart:    [x]   Regular rhythm             []  irregular rhythm    [x]   Previous H&P reviewed, patient examined and there are NO CHANGES                 []   Previous H&P reviewed, patient examined and there are changes noted.   [name], MD 8/31/201612:56 PM

## 2015-07-30 NOTE — Anesthesia Preprocedure Evaluation (Signed)
Anesthesia Evaluation  Patient identified by MRN, date of birth, ID band Patient unresponsive  General Assessment Comment:Hx from his nurse and the chart.  Reviewed: Allergy & Precautions, NPO status , Patient's Chart, lab work & pertinent test results  Airway Mallampati: III  TM Distance: >3 FB Neck ROM: Limited    Dental  (+) Teeth Intact   Pulmonary sleep apnea and Continuous Positive Airway Pressure Ventilation ,    Pulmonary exam normal       Cardiovascular Exercise Tolerance: Poor hypertension, Pt. on medications and Pt. on home beta blockers +CHF Normal cardiovascular exam    Neuro/Psych Depression Bipolar Disorder Catatonic.   GI/Hepatic   Endo/Other  diabetes, Poorly Controlled, Type 2  Renal/GU Renal InsufficiencyRenal disease     Musculoskeletal   Abdominal (+)  Abdomen: soft.    Peds  Hematology   Anesthesia Other Findings   Reproductive/Obstetrics                             Anesthesia Physical Anesthesia Plan  ASA: IV  Anesthesia Plan: General   Post-op Pain Management:    Induction: Intravenous  Airway Management Planned: Mask  Additional Equipment:   Intra-op Plan:   Post-operative Plan:   Informed Consent: I have reviewed the patients History and Physical, chart, labs and discussed the procedure including the risks, benefits and alternatives for the proposed anesthesia with the patient or authorized representative who has indicated his/her understanding and acceptance.     Plan Discussed with: CRNA  Anesthesia Plan Comments:         Anesthesia Quick Evaluation

## 2015-07-30 NOTE — Consult Note (Signed)
[facility] Face-to-Face Psychiatry Consult   Reason for Consult:  Consult follow-up for this 64 year old man with bipolar disorder with severe depression. Currently on the medical service to assist with hydration Referring Physician:  Posey Pronto Patient Identification: Christian [name] MRN:  [id number] Principal Diagnosis: <principal problem not specified> Diagnosis:   Patient Active Problem List   Diagnosis Date Noted  . Pressure ulcer [L89.90] 07/30/2015  . Protein-calorie malnutrition, severe [E43] 07/30/2015  . Dehydration [E86.0] 07/29/2015  . Bipolar disorder, current episode depressed, severe, with psychotic features [F31.5] 07/09/2015  . Diabetes [E11.9] 07/09/2015  . UTI (urinary tract infection) [N39.0] 06/19/2014  . Positive blood culture [R78.81] 06/19/2014  . Bipolar affective disorder, current episode manic with psychotic symptoms [F31.2] 06/17/2014  . Type II or unspecified type diabetes mellitus with unspecified complication, uncontrolled [E11.8] 06/02/2014  . Other and unspecified hyperlipidemia [E78.5] 06/02/2014  . OSA on CPAP [G47.33] 06/02/2014  . 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 Gout [M10.9] 06/02/2014  . Hypertension [I10]     Total Time spent with patient: 30 minutes  Subjective:   Christian [name] is a 64 y.o. male patient admitted with "I'm depressed".  HPI:  Follow-up consult for this gentleman who is now on the medical service. I am very appreciative of Dr. Posey Pronto and the medical service for accommodating this patient for the time being. Placement of the PICC line allowed [place] to do ECT today. This afternoon I find him significantly better than he was before treatment this morning. He is able to speak and respond in a more lucid manner. He tells me that he is depressed. He can tell me which parts of his body hurt. He can tell me that he is feeling bad but not any worse than he was before. His affect and mood seem to  be a little less agitated. He tolerated treatment well this morning. HPI Elements:   Quality:  Severe depression with disorganized and psychotic thinking. Severity:  Severe and life-threatening. Timing:  Worse for weeks. Duration:  Ongoing. Context:  Long-standing treatment but recently with dehydration and malnutrition.  Past Medical History:  Past Medical History  Diagnosis Date  . Mood swings   . Type II or unspecified type diabetes mellitus with unspecified complication, uncontrolled 06/02/2014  . Other and unspecified hyperlipidemia 06/02/2014  . OSA on CPAP 06/02/2014  . 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 Bipolar disorder   . CHF (congestive heart failure)   . CKD (chronic kidney disease), stage III   . Diabetes mellitus without complication   . Hypertension   . Gout   . Bipolar 1 disorder   . OSA on CPAP    History reviewed. No pertinent past surgical history. Family History:  Family History  Problem Relation Age of Onset  . Dementia Mother   . Arthritis Father   . Heart failure Neg Hx   . Kidney failure Neg Hx   . Cancer Neg Hx    Social History:  History  Alcohol Use No     History  Drug Use No    Social History   Social History  . Marital Status: Married    Spouse Name: N/A  . Number of Children: N/A  . Years of Education: N/A   Social History Main Topics  . Smoking status: Never Smoker   . Smokeless tobacco: None  . Alcohol Use: No  . Drug Use: No  . Sexual Activity:  Not Currently   Other Topics Concern  . None   Social History Narrative   ** Merged History Encounter **       Additional Social History:                          Allergies:  No Known Allergies  Labs:  Results for orders placed or performed during the hospital encounter of 07/29/15 (from the past 48 hour(s))  CBC     Status: None   Collection Time: 07/30/15 12:25 AM  Result Value Ref Range   WBC 4.3 3.8 - 10.6 K/uL   RBC  5.20 4.40 - 5.90 MIL/uL   Hemoglobin 15.4 13.0 - 18.0 g/dL   HCT 47.2 40.0 - 52.0 %   MCV 90.7 80.0 - 100.0 fL   MCH 29.7 26.0 - 34.0 pg   MCHC 32.7 32.0 - 36.0 g/dL   RDW 12.4 11.5 - 14.5 %   Platelets 197 150 - 440 K/uL  Creatinine, serum     Status: Abnormal   Collection Time: 07/30/15 12:25 AM  Result Value Ref Range   Creatinine, Ser 1.25 (H) 0.61 - 1.24 mg/dL   GFR calc non Af Amer >60 >60 mL/min   GFR calc Af Amer >60 >60 mL/min    Comment: (NOTE) The eGFR has been calculated using the CKD EPI equation. This calculation has not been validated in all clinical situations. eGFR's persistently <60 mL/min signify possible Chronic Kidney Disease.   Glucose, capillary     Status: Abnormal   Collection Time: 07/30/15 10:59 AM  Result Value Ref Range   Glucose-Capillary 149 (H) 65 - 99 mg/dL  Glucose, capillary     Status: Abnormal   Collection Time: 07/30/15  1:42 PM  Result Value Ref Range   Glucose-Capillary 193 (H) 65 - 99 mg/dL  Glucose, capillary     Status: Abnormal   Collection Time: 07/30/15  4:54 PM  Result Value Ref Range   Glucose-Capillary 179 (H) 65 - 99 mg/dL  Glucose, capillary     Status: Abnormal   Collection Time: 07/30/15  9:17 PM  Result Value Ref Range   Glucose-Capillary 225 (H) 65 - 99 mg/dL    Vitals: Blood pressure 139/80, pulse 102, temperature 98.1 F (36.7 C), temperature source Axillary, resp. rate 16, height 6' (1.829 m), weight 93.895 kg (207 lb), SpO2 98 %.  Risk to Self: Is patient at risk for suicide?: Yes Risk to Others:   Prior Inpatient Therapy:   Prior Outpatient Therapy:    Current Facility-Administered Medications  Medication Dose Route Frequency Provider Last Rate Last Dose  . 0.9 %  sodium chloride infusion   Intravenous Continuous [name], MD 125 mL/hr at 07/29/15 2340    . acetaminophen (TYLENOL) tablet 650 mg  650 mg Oral Q6H PRN [name], MD       Or  . acetaminophen (TYLENOL) suppository 650 mg  650 mg  Rectal Q6H PRN [name], MD      . allopurinol (ZYLOPRIM) tablet 300 mg  300 mg Oral Daily [name], MD   300 mg at 07/30/15 1000  . atorvastatin (LIPITOR) tablet 10 mg  10 mg Oral Daily [name], MD   10 mg at 07/30/15 1000  . cholecalciferol (VITAMIN D) tablet 800 Units  800 Units Oral Daily [name], MD   800 Units at 07/30/15 1000  . clonazePAM (KLONOPIN) tablet 1 mg  1 mg Oral QHS [name], MD   1 mg at 07/29/15 2208  . colchicine tablet 0.6 mg  0.6 mg Oral Daily [name], MD   0.6 mg at 07/30/15 1701  . enoxaparin (LOVENOX) injection 40 mg  40 mg Subcutaneous Q24H [name], MD   40 mg at 07/29/15 2211  . feeding supplement (GLUCERNA SHAKE) (GLUCERNA SHAKE) liquid 237 mL  237 mL Oral TID WC [name], MD   237 mL at 07/30/15 1649  . hydrALAZINE (APRESOLINE) tablet 25 mg  25 mg Oral 4 times per day [name], MD   25 mg at 07/29/15 2208  . insulin aspart (novoLOG) injection 0-9 Units  0-9 Units Subcutaneous TID WC Dustin Flock, MD   0 Units at 07/30/15 1702  . megestrol (MEGACE) 400 MG/10ML suspension 400 mg  400 mg Oral Daily [name], MD   400 mg at 07/30/15 1701  . metoCLOPramide (REGLAN) injection 10 mg  10 mg Intravenous Once PRN Amy Rice, MD      . metoprolol succinate (TOPROL-XL) 24 hr tablet 200 mg  200 mg Oral Daily [name], MD   200 mg at 07/30/15 1701  . mirtazapine (REMERON) tablet 30 mg  30 mg Oral QHS [name], MD   30 mg at 07/29/15 2208  . OLANZapine (ZYPREXA) injection 10 mg  10 mg Intramuscular BID AC [name], MD   10 mg at 07/30/15 3662  . ondansetron (ZOFRAN) tablet 4 mg  4 mg Oral Q6H PRN [name], MD       Or  . ondansetron (ZOFRAN) injection 4 mg  4 mg Intravenous Q6H PRN [name], MD      . traZODone (DESYREL) tablet 25 mg  25 mg Oral TID [name], MD   25 mg at 07/29/15 2208    Musculoskeletal: Strength & Muscle Tone: decreased Gait & Station: unable to  stand Patient leans: N/A  Psychiatric Specialty Exam: Physical Exam  ROS  Blood pressure 139/80, pulse 102, temperature 98.1 F (36.7 C), temperature source Axillary, resp. rate 16, height 6' (1.829 m), weight 93.895 kg (207 lb), SpO2 98 %.Body mass index is 28.07 kg/(m^2).  General Appearance: Disheveled  Eye Contact::  Minimal  Speech:  Slow  Volume:  Decreased  Mood:  Depressed  Affect:  Flat  Thought Process:  Minimal in amount  Orientation:  Full (Time, Place, and Person)  Thought Content:  Delusions and Rumination  Suicidal Thoughts:  Yes.  without intent/plan  Homicidal Thoughts:  No  Memory:  Immediate;   Fair Recent;   Fair Remote;   Fair  Judgement:  Impaired  Insight:  Present  Psychomotor Activity:  Psychomotor Retardation  Concentration:  Poor  Recall:  Colp of Knowledge:Fair  Language: Fair  Akathisia:  No  Handed:  Right  AIMS (if indicated):     Assets:  Financial Resources/Insurance Resilience Social Support  ADL's:  Impaired  Cognition: Impaired,  Moderate and Severe  Sleep:      Medical Decision Making: Review of Psycho-Social Stressors (1), Review or order clinical lab tests (1), Established Problem, Worsening (2) and Review of Medication Regimen & Side Effects (2)  Treatment Plan Summary: Medication management and Plan Once again I expressed my appreciation to Dr. Posey Pronto for accommodating the patient on the medical service. The combination of getting ECT this morning and getting hydrated I think is starting to really improve  his mental state. I will continue to defend what I think is an obvious statement that the patient needs hospitalization. If we tried to discharge him to an environment without hospitalization at this point there is no doubt that he would still starve himself and would wind up either dying or right back in the hospital again. Chances are very good that he is going to respond to ECT. Plan is to continue with next treatment  scheduled for Friday morning. I don't know if we will need to have him stay on the medical service for the entire time. It was very difficult to get an IV in him previously. I recognize that this is a burden if we are not doing as specifically medical treatment. Supportive counseling done with the patient. Continue current medicine as prescribed. Continue following up daily.  Plan:  Supportive therapy provided about ongoing stressors. Disposition: Continue plan for 3 times a week ECT and current medicine as well as monitoring and treating his other medical problems including diabetes  Alethia Berthold 07/30/2015 10:08 PM

## 2015-07-30 NOTE — Anesthesia Procedure Notes (Signed)
Date/Time: 07/30/2015 1:02 PM Performed by: Lily Kocher Pre-anesthesia Checklist: Patient identified, Emergency Drugs available, Suction available and Patient being monitored Patient Re-evaluated:Patient Re-evaluated prior to inductionOxygen Delivery Method: Circle system utilized Preoxygenation: Pre-oxygenation with 100% oxygen Intubation Type: IV induction Ventilation: Mask ventilation without difficulty and Mask ventilation throughout procedure Airway Equipment and Method: Bite block Placement Confirmation: positive ETCO2 Dental Injury: Teeth and Oropharynx as per pre-operative assessment

## 2015-07-31 LAB — CULTURE, BLOOD (ROUTINE X 2): Culture: NO GROWTH

## 2015-07-31 LAB — GLUCOSE, CAPILLARY
Glucose-Capillary: 207 mg/dL — ABNORMAL HIGH (ref 65–99)
Glucose-Capillary: 168 mg/dL — ABNORMAL HIGH (ref 65–99)
Glucose-Capillary: 163 mg/dL — ABNORMAL HIGH (ref 65–99)
Glucose-Capillary: 137 mg/dL — ABNORMAL HIGH (ref 65–99)

## 2015-07-31 MED ORDER — DOCUSATE SODIUM 100 MG PO CAPS
200.0000 mg | ORAL_CAPSULE | Freq: Two times a day (BID) | ORAL | Status: DC
  Administered 2015-08-03 – 2015-08-27 (×41): 200 mg via ORAL
  Filled 2015-07-31 (×46): qty 2

## 2015-07-31 MED ORDER — SENNA 8.6 MG PO TABS
1.0000 | ORAL_TABLET | Freq: Every day | ORAL | Status: DC
  Administered 2015-08-03 – 2015-08-27 (×20): 8.6 mg via ORAL
  Filled 2015-07-31 (×21): qty 1

## 2015-07-31 NOTE — Progress Notes (Signed)
Patient ID: Christian [name], male   DOB: 1951-02-10, 64 y.o.   MRN: [id number]  [facility] PROGRESS NOTE  PCP: Leanor Rubenstein, MD  HPI/Subjective:  Patient had his ECT treatment yesterday. Able to eat his breakfast completely today.          Objective: Filed Vitals:   07/31/15 1205  BP: 131/81  Pulse:   Temp:   Resp:    ROS: Review of Systems  Unable to perform ROS  secondary to severe depression patient's refuses to complaint Exam: Physical Exam  HENT:  Nose: No mucosal edema.  Mouth/Throat: No oropharyngeal exudate or posterior oropharyngeal edema.  Eyes: Conjunctivae, EOM and lids are normal. Pupils are equal, round, and reactive to light.  Neck: No JVD present. Carotid bruit is not present. No edema present. No thyroid mass and no thyromegaly present.  Cardiovascular: S1 normal and S2 normal.   present.  Exam reveals no gallop.   No murmur heard. Pulses:      Dorsalis pedis pulses are 2+ on the right side, and 2+ on the left side.  Respiratory: No respiratory distress. He has no wheezes. He has no rhonchi. He has no rales.  GI: Soft. Bowel sounds are normal. There is no tenderness.  Musculoskeletal:       Right ankle: He exhibits  no swelling       Left ankle: He exhibits no swelling.  Lymphadenopathy:    He has no cervical adenopathy.  Neurological: He is alert. No cranial nerve deficit.  Skin: Skin is warm. No rash noted. Nails show no clubbing.  Large Left medial foot blood blister with skin intact.  Psychiatric: His affect is blunt.    Data Reviewed: Basic Metabolic Panel:  Recent Labs Lab 07/25/15 2344 07/30/15 0025  NA 144  --   K 4.7  --   CL 104  --   CO2 30  --   GLUCOSE 186*  --   BUN 22*  --   CREATININE 1.34* 1.25*  CALCIUM 9.5  --    CBC:  Recent Labs Lab 07/25/15 2344 07/30/15 0025  WBC 6.9 4.3  HGB 17.0 15.4  HCT 51.5 47.2  MCV 91.2 90.7  PLT 257 197    Scheduled Meds: . allopurinol  300 mg Oral Daily   . atorvastatin  10 mg Oral Daily  . cholecalciferol  800 Units Oral Daily  . clonazePAM  1 mg Oral QHS  . colchicine  0.6 mg Oral Daily  . docusate sodium  200 mg Oral BID  . enoxaparin (LOVENOX) injection  40 mg Subcutaneous Q24H  . feeding supplement (GLUCERNA SHAKE)  237 mL Oral TID WC  . hydrALAZINE  25 mg Oral 4 times per day  . insulin aspart  0-9 Units Subcutaneous TID WC  . megestrol  400 mg Oral Daily  . metoprolol  200 mg Oral Daily  . mirtazapine  30 mg Oral QHS  . OLANZapine  10 mg Intramuscular BID AC  . senna  1 tablet Oral Daily  . traZODone  25 mg Oral TID    Assessment/Plan:  1. Severe depression: Continue ECT treatment as per psychiatry. 2. Dehydration, acute on chronic kidney disease stage III.-Continue IV fluids for now 3. Essential hypertension,- continue metoprolol,Cardizem CD  And hydralazine. 4. Diabetes type 2- sliding-scale only since the patient not eating much. 5. Hyperlipidemia unspecified- hold atorvastatin 6. History of gout on allopurinol and colchicine. Code Status:     Code Status Orders  Start     Ordered   07/16/15 1213  Full code   Continuous     07/16/15 1213     Disposition Plan: To be determined  Time spent: 32 minutes minutes  Brittanny Levenhagen, [facility]  [facility]

## 2015-07-31 NOTE — Progress Notes (Signed)
Assumed care of patient from Sharyne Peach, RN at 1800. Bo Mcclintock, RN

## 2015-07-31 NOTE — Consult Note (Signed)
[facility] Face-to-Face Psychiatry Consult   Reason for Consult:  Consult follow-up for this 64 year old man with bipolar disorder with severe depression. Currently on the medical service to assist with hydration Referring Physician:  Posey Pronto Patient Identification: Christian [name] MRN:  [id number] Principal Diagnosis: Bipolar disorder, current episode depressed, severe, with psychotic features Diagnosis:   Patient Active Problem List   Diagnosis Date Noted  . Pressure ulcer [L89.90] 07/30/2015  . Protein-calorie malnutrition, severe [E43] 07/30/2015  . Dehydration [E86.0] 07/29/2015  . Bipolar disorder, current episode depressed, severe, with psychotic features [F31.5] 07/09/2015  . Diabetes [E11.9] 07/09/2015  . UTI (urinary tract infection) [N39.0] 06/19/2014  . Positive blood culture [R78.81] 06/19/2014  . Bipolar affective disorder, current episode manic with psychotic symptoms [F31.2] 06/17/2014  . Type II or unspecified type diabetes mellitus with unspecified complication, uncontrolled [E11.8] 06/02/2014  . Other and unspecified hyperlipidemia [E78.5] 06/02/2014  . OSA on CPAP [G47.33] 06/02/2014  . 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 Gout [M10.9] 06/02/2014  . Hypertension [I10]     Total Time spent with patient: 30 minutes  Subjective:   Christian [name] is a 64 y.o. male patient admitted with "I'm depressed".  HPI: Follow-up for this 64 year old man with bipolar disorder with severe depression. Patient examined chart reviewed. Patient when I came to see him today was completely naked. The nurse sitting with him says that he has refused to put on any close or let a sheet be placed over him. The patient claimed he had not had anything at all to eat today but the nurse said that was not true. He still does not get out of bed and is not using the bathroom much. Makes minimal eye contact. Continues to say he is depressed and he feels  like things were hopeless for him. He is less irritable than he had been previously HPI Elements:   Quality:  Severe depression with disorganized and psychotic thinking. Severity:  Severe and life-threatening. Timing:  Worse for weeks. Duration:  Ongoing. Context:  Long-standing treatment but recently with dehydration and malnutrition.  Past Medical History:  Past Medical History  Diagnosis Date  . Mood swings   . Type II or unspecified type diabetes mellitus with unspecified complication, uncontrolled 06/02/2014  . Other and unspecified hyperlipidemia 06/02/2014  . OSA on CPAP 06/02/2014  . 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 Bipolar disorder   . CHF (congestive heart failure)   . CKD (chronic kidney disease), stage III   . Diabetes mellitus without complication   . Hypertension   . Gout   . Bipolar 1 disorder   . OSA on CPAP    History reviewed. No pertinent past surgical history. Family History:  Family History  Problem Relation Age of Onset  . Dementia Mother   . Arthritis Father   . Heart failure Neg Hx   . Kidney failure Neg Hx   . Cancer Neg Hx    Social History:  History  Alcohol Use No     History  Drug Use No    Social History   Social History  . Marital Status: Married    Spouse Name: N/A  . Number of Children: N/A  . Years of Education: N/A   Social History Main Topics  . Smoking status: Never Smoker   . Smokeless tobacco: None  . Alcohol Use: No  . Drug Use: No  . Sexual Activity: Not Currently  Other Topics Concern  . None   Social History Narrative   ** Merged History Encounter **       Additional Social History:                          Allergies:  No Known Allergies  Labs:  Results for orders placed or performed during the hospital encounter of 07/29/15 (from the past 48 hour(s))  CBC     Status: None   Collection Time: 07/30/15 12:25 AM  Result Value Ref Range   WBC 4.3 3.8 - 10.6  K/uL   RBC 5.20 4.40 - 5.90 MIL/uL   Hemoglobin 15.4 13.0 - 18.0 g/dL   HCT 47.2 40.0 - 52.0 %   MCV 90.7 80.0 - 100.0 fL   MCH 29.7 26.0 - 34.0 pg   MCHC 32.7 32.0 - 36.0 g/dL   RDW 12.4 11.5 - 14.5 %   Platelets 197 150 - 440 K/uL  Creatinine, serum     Status: Abnormal   Collection Time: 07/30/15 12:25 AM  Result Value Ref Range   Creatinine, Ser 1.25 (H) 0.61 - 1.24 mg/dL   GFR calc non Af Amer >60 >60 mL/min   GFR calc Af Amer >60 >60 mL/min    Comment: (NOTE) The eGFR has been calculated using the CKD EPI equation. This calculation has not been validated in all clinical situations. eGFR's persistently <60 mL/min signify possible Chronic Kidney Disease.   Glucose, capillary     Status: Abnormal   Collection Time: 07/30/15 10:59 AM  Result Value Ref Range   Glucose-Capillary 149 (H) 65 - 99 mg/dL  Glucose, capillary     Status: Abnormal   Collection Time: 07/30/15  1:42 PM  Result Value Ref Range   Glucose-Capillary 193 (H) 65 - 99 mg/dL  Glucose, capillary     Status: Abnormal   Collection Time: 07/30/15  4:54 PM  Result Value Ref Range   Glucose-Capillary 179 (H) 65 - 99 mg/dL  Glucose, capillary     Status: Abnormal   Collection Time: 07/30/15  9:17 PM  Result Value Ref Range   Glucose-Capillary 225 (H) 65 - 99 mg/dL  Glucose, capillary     Status: Abnormal   Collection Time: 07/31/15  7:24 AM  Result Value Ref Range   Glucose-Capillary 207 (H) 65 - 99 mg/dL  Glucose, capillary     Status: Abnormal   Collection Time: 07/31/15 11:03 AM  Result Value Ref Range   Glucose-Capillary 168 (H) 65 - 99 mg/dL  Glucose, capillary     Status: Abnormal   Collection Time: 07/31/15  4:09 PM  Result Value Ref Range   Glucose-Capillary 163 (H) 65 - 99 mg/dL    Vitals: Blood pressure 145/81, pulse 100, temperature 98.9 F (37.2 C), temperature source Oral, resp. rate 18, height 6' (1.829 m), weight 93.895 kg (207 lb), SpO2 99 %.  Risk to Self: Is patient at risk for  suicide?: Yes Risk to Others:   Prior Inpatient Therapy:   Prior Outpatient Therapy:    Current Facility-Administered Medications  Medication Dose Route Frequency Provider Last Rate Last Dose  . 0.9 %  sodium chloride infusion   Intravenous Continuous [name], MD 125 mL/hr at 07/31/15 1749    . acetaminophen (TYLENOL) tablet 650 mg  650 mg Oral Q6H PRN [name], MD   650 mg at 07/31/15 3474   Or  . acetaminophen (TYLENOL) suppository 650 mg  650 mg Rectal Q6H PRN [name], MD      . allopurinol (ZYLOPRIM) tablet 300 mg  300 mg Oral Daily [name], MD   300 mg at 07/30/15 1000  . atorvastatin (LIPITOR) tablet 10 mg  10 mg Oral Daily [name], MD   10 mg at 07/30/15 1000  . cholecalciferol (VITAMIN D) tablet 800 Units  800 Units Oral Daily [name], MD   800 Units at 07/30/15 1000  . clonazePAM (KLONOPIN) tablet 1 mg  1 mg Oral QHS [name], MD   1 mg at 07/30/15 2310  . colchicine tablet 0.6 mg  0.6 mg Oral Daily [name], MD   0.6 mg at 07/30/15 1701  . docusate sodium (COLACE) capsule 200 mg  200 mg Oral BID Dustin Flock, MD   200 mg at 07/31/15 1235  . enoxaparin (LOVENOX) injection 40 mg  40 mg Subcutaneous Q24H [name], MD   40 mg at 07/30/15 2309  . feeding supplement (GLUCERNA SHAKE) (GLUCERNA SHAKE) liquid 237 mL  237 mL Oral TID WC [name], MD   237 mL at 07/31/15 1741  . hydrALAZINE (APRESOLINE) tablet 25 mg  25 mg Oral 4 times per day [name], MD   25 mg at 07/31/15 0512  . insulin aspart (novoLOG) injection 0-9 Units  0-9 Units Subcutaneous TID WC Dustin Flock, MD   2 Units at 07/31/15 1324  . megestrol (MEGACE) 400 MG/10ML suspension 400 mg  400 mg Oral Daily [name], MD   400 mg at 07/30/15 1701  . metoCLOPramide (REGLAN) injection 10 mg  10 mg Intravenous Once PRN Amy Rice, MD      . metoprolol succinate (TOPROL-XL) 24 hr tablet 200 mg  200 mg Oral Daily [name], MD   200 mg at  07/30/15 1701  . mirtazapine (REMERON) tablet 30 mg  30 mg Oral QHS [name], MD   30 mg at 07/30/15 2309  . OLANZapine (ZYPREXA) injection 10 mg  10 mg Intramuscular BID AC [name], MD   10 mg at 07/31/15 1220  . ondansetron (ZOFRAN) tablet 4 mg  4 mg Oral Q6H PRN [name], MD       Or  . ondansetron (ZOFRAN) injection 4 mg  4 mg Intravenous Q6H PRN [name], MD      . senna (SENOKOT) tablet 8.6 mg  1 tablet Oral Daily Dustin Flock, MD   8.6 mg at 07/31/15 1234  . traZODone (DESYREL) tablet 25 mg  25 mg Oral TID [name], MD   25 mg at 07/30/15 2309    Musculoskeletal: Strength & Muscle Tone: decreased Gait & Station: unable to stand Patient leans: N/A  Psychiatric Specialty Exam: Physical Exam   ROS   Blood pressure 145/81, pulse 100, temperature 98.9 F (37.2 C), temperature source Oral, resp. rate 18, height 6' (1.829 m), weight 93.895 kg (207 lb), SpO2 99 %.Body mass index is 28.07 kg/(m^2).  General Appearance: Disheveled  Eye Contact::  Minimal  Speech:  Slow  Volume:  Decreased  Mood:  Depressed  Affect:  Flat  Thought Process:  Minimal in amount  Orientation:  Full (Time, Place, and Person)  Thought Content:  Delusions and Rumination  Suicidal Thoughts:  Yes.  without intent/plan  Homicidal Thoughts:  No  Memory:  Immediate;   Fair Recent;   Fair Remote;   Fair  Judgement:  Impaired  Insight:  Present  Psychomotor Activity:  Psychomotor Retardation  Concentration:  Poor  Recall:  [place] of Knowledge:Fair  Language: Fair  Akathisia:  No  Handed:  Right  AIMS (if indicated):     Assets:  Financial Resources/Insurance Resilience Social Support  ADL's:  Impaired  Cognition: Impaired,  Moderate and Severe  Sleep:      Medical Decision Making: Review of Psycho-Social Stressors (1), Review or order clinical lab tests (1), Established Problem, Worsening (2) and Review of Medication Regimen & Side Effects (2)  Treatment  Plan Summary: Medication management and Plan I continued to express my great appreciation to Dr. Posey Pronto and the rest of the hospitalist service for allowing this gentleman to be on the medicine service for now with his PICC line. I still think the hydration is probably helping him and I think ECT is clearly benefiting him as well. Plan is for next ECT treatment tomorrow morning. I'm hoping that perhaps we can get enough improvement that we might consider transfer since we will not be doing ECT on Monday. Patient advised of the plan and does not complain.  Plan:  Supportive therapy provided about ongoing stressors. Disposition: Continue plan for 3 times a week ECT and current medicine as well as monitoring and treating his other medical problems including diabetes  Alethia Berthold 07/31/2015 7:48 PM

## 2015-07-31 NOTE — Plan of Care (Signed)
Problem: Discharge Progression Outcomes Goal: Other Discharge Outcomes/Goals Outcome: Not Progressing Patient was very withdrawn and refused all treatments. Ate breakfast and lunch fairly well. Did not tolerate talking to anyone or following any direction. Was able to give injections and finally drank his Glucerna later encouraged by his wife.

## 2015-07-31 NOTE — Progress Notes (Signed)
Pt agreed to have bladder scan which shower greater than . Pt refused to use urinal. Pt wife, Gloriajean Dell called to see if pt is open to her advise but she stated pt will not listen to her either. Will continue to assess and encourage pt to void.

## 2015-07-31 NOTE — Progress Notes (Signed)
Patient approached by Ellison Hughs, RN to bladder scan - patient refused. I reapproached patient a second time to bladder scan and patient refused again. Educated patient risks and benefits. Patient made aware of importance and notified that he will be asked again. Bo Mcclintock, RN

## 2015-07-31 NOTE — Progress Notes (Signed)
Pt has not voided at night, encouraged multiple times to void, pt states "I can't". Bladder scanned patient, has >999 ml in his bladder. MD notified, one time order for in and out.

## 2015-07-31 NOTE — Care Management Important Message (Signed)
Important Message  Patient Details  Name: Christian [name] MRN: [id number] Date of Birth: [birth year]   Medicare Important Message Given:  Yes-third notification given    [name] 07/31/2015, 10:28 AM

## 2015-07-31 NOTE — Progress Notes (Signed)
Dr. Anne Hahn notified pt extremely uncomfortable c/o abdominal pain & still has not voided since 0600am this morning w/ in/out foley. Dr. Anne Hahn acknowledged & will place order for foley catheter due to acute urinary retention since no output after in/out this morning. Will continue to assess.

## 2015-07-31 NOTE — Progress Notes (Signed)
On call MD notified patient has not urinated since 0600am when he was in/out cath. Pt has refused bladder scan by two nurses and now is refusing to have me bladder scan and states he does not want to void. Pt explained risks of not voiding for such a long period of time.   MD acknowledged stating we cannot force patient to have bladder scan, void, or insert a catheter until pt agrees. Will continue to assess and talk with charge RN.

## 2015-07-31 NOTE — Plan of Care (Signed)
Problem: Discharge Progression Outcomes Goal: Other Discharge Outcomes/Goals Outcome: Progressing Pt is alert, oriented to self. Appears to be more talkative than previously during the day. Asked for snacks and soda. Pt took nighttime medications, NS continued at 125 ml/hr, sitter remains at bedside.

## 2015-08-01 ENCOUNTER — Encounter: Payer: Self-pay | Admitting: *Deleted

## 2015-08-01 ENCOUNTER — Inpatient Hospital Stay: Payer: Commercial Managed Care - HMO | Admitting: Anesthesiology

## 2015-08-01 ENCOUNTER — Inpatient Hospital Stay: Payer: Commercial Managed Care - HMO

## 2015-08-01 ENCOUNTER — Other Ambulatory Visit: Payer: Self-pay

## 2015-08-01 LAB — GLUCOSE, CAPILLARY: Glucose-Capillary: 146 mg/dL — ABNORMAL HIGH (ref 65–99)

## 2015-08-01 MED ORDER — LIDOCAINE HCL (CARDIAC) 20 MG/ML IV SOLN
4.0000 mg | Freq: Once | INTRAVENOUS | Status: AC
  Administered 2015-08-01: 4 mg via INTRAVENOUS
  Filled 2015-08-01: qty 0.2

## 2015-08-01 MED ORDER — LORAZEPAM 2 MG/ML IJ SOLN
0.5000 mg | Freq: Once | INTRAMUSCULAR | Status: AC
  Administered 2015-08-01: 0.5 mg via INTRAVENOUS
  Filled 2015-08-01: qty 1

## 2015-08-01 MED ORDER — LABETALOL HCL 5 MG/ML IV SOLN
20.0000 mg | Freq: Once | INTRAVENOUS | Status: AC
  Administered 2015-08-01: 20 mg via INTRAVENOUS

## 2015-08-01 MED ORDER — METOCLOPRAMIDE HCL 5 MG/ML IJ SOLN: 10.0000 mg | Freq: Once | INTRAMUSCULAR | Status: DC

## 2015-08-01 MED ORDER — NITROGLYCERIN 0.2 MG/ML ON CALL CATH LAB
100.0000 ug | Freq: Once | INTRAVENOUS | Status: AC
  Administered 2015-08-01: 100 ug via INTRAVENOUS

## 2015-08-01 MED ORDER — SUCCINYLCHOLINE CHLORIDE 20 MG/ML IJ SOLN
120.0000 mg | Freq: Once | INTRAMUSCULAR | Status: AC
  Administered 2015-08-01: 17:00:00 120 mg via INTRAVENOUS
  Filled 2015-08-01: qty 6

## 2015-08-01 MED ORDER — SODIUM CHLORIDE 0.9 % IV SOLN
250.0000 mL | Freq: Once | INTRAVENOUS | Status: AC
  Administered 2015-08-01: 250 mL via INTRAVENOUS

## 2015-08-01 MED ORDER — LABETALOL HCL 5 MG/ML IV SOLN
20.0000 mg | Freq: Once | INTRAVENOUS | Status: AC
  Administered 2015-08-01: 17:00:00 20 mg via INTRAVENOUS
  Filled 2015-08-01: qty 4

## 2015-08-01 MED ORDER — POLYETHYLENE GLYCOL 3350 17 G PO PACK: 17.0000 g | PACK | Freq: Once | ORAL | Status: DC

## 2015-08-01 MED ORDER — MIRTAZAPINE 15 MG PO TABS
45.0000 mg | ORAL_TABLET | Freq: Every day | ORAL | Status: DC
  Administered 2015-08-03 – 2015-08-26 (×20): 45 mg via ORAL
  Filled 2015-08-01 (×24): qty 3

## 2015-08-01 MED ORDER — KETAMINE HCL 10 MG/ML IJ SOLN
120.0000 mg | Freq: Once | INTRAMUSCULAR | Status: AC
  Administered 2015-08-01: 17:00:00 120 mg via INTRAVENOUS

## 2015-08-01 MED ORDER — POLYETHYLENE GLYCOL 3350 17 G PO PACK
17.0000 g | PACK | Freq: Every day | ORAL | Status: DC
  Administered 2015-08-03 – 2015-08-27 (×21): 17 g via ORAL
  Filled 2015-08-01 (×21): qty 1

## 2015-08-01 NOTE — Evaluation (Signed)
1150 Pt transferred back to rm 122.  Report given to [facility] RN that pt must remain NPO until Procedure this afternoon at 1600.  Sitter also informed not to let pt eat or drink anything until after ECT treatment has been done.

## 2015-08-01 NOTE — Plan of Care (Signed)
Problem: Discharge Progression Outcomes Goal: Other Discharge Outcomes/Goals Outcome: Progressing 1. C/o pain but refused Tylenol when I brought into room.  2. Hemodynamically:             -VSS, afebrile              -IVF infusing              -no urine output since in/out cath at 6am yesterday morning, bladder scan showed >958mL, order for foley catheter received & foley placed immediately draining . Since inserting foley, pt less agitated and resting comfortably 3. Tolerating carb modified diet but poor intake. Breakfast foods are pt favorite per pt wife 4. Pt refuses to get out of bed. Turns independently in bed.

## 2015-08-01 NOTE — Procedures (Signed)
1544 VDS taken on arrival to ECT for treatment.  B/p 151/98 on arrival.  Dr Gerre Pebbles here and informed him of pts B/p and that pt has not had in b/p medication today.  Dr. Toni Amend stated he would given him b/p medication IV in procedure room.

## 2015-08-01 NOTE — Procedures (Signed)
ECT SERVICES Physician's Interval Evaluation & Treatment Note  Patient Identification: Christian [name] MRN:  [id number] Date of Evaluation:  08/01/2015 TX #: 4  MADRS: 42  MMSE: 28  P.E. Findings:  Patient stays in bed almost all the time. Eating slightly better. Blood pressure up and down. Sore on foot still healing.  Psychiatric Interval Note:  Withdrawn extremely depressed barely communicative. Very negative. Hopeless and helpless. Nothing thing disorganized  Subjective:  Patient is a 64 y.o. male seen for evaluation for Electroconvulsive Therapy. No complaints specifically. He feels hopeless  Treatment Summary:   []   Right Unilateral             [x]  Bilateral   % Energy : 1.0 ms 100%   Impedance: 560 ohms  Seizure Energy Index: 1201 V squared  Postictal Suppression Index: Less than 10%  Seizure Concordance Index: 97%  Medications  Pre Shock: Xylocaine 4 mg, labetalol 20 mg, ketamine 120 mg, succinylcholine 120 mg  Post Shock: Labetalol 20 mg  Seizure Duration: 28 seconds by EMG, 32 seconds by EEG   Comments: Patient is tolerating treatment. Unfortunately we will not be able to do treatment on Monday because of the holiday. Next ECT scheduled for Wednesday.   Lungs:  [x]   Clear to auscultation               []  Other:   Heart:    [x]   Regular rhythm             []  irregular rhythm    [x]   Previous H&P reviewed, patient examined and there are NO CHANGES                 []   Previous H&P reviewed, patient examined and there are changes noted.   [name], MD 9/2/20164:56 PM

## 2015-08-01 NOTE — Progress Notes (Signed)
Nutrition Follow-up  DOCUMENTATION CODES:   Severe malnutrition in context of acute illness/injury  INTERVENTION:   Meals and Snacks: Cater to patient preferences; encourage intake at meal times Medical Food Supplement Therapy: continue Glucerna and Mighty Shakes as ordered Coordination of Care: RD notes constipation and also notes pt has been refusing stool softeners. Pt would likely benefit from stronger bowel regimen.   NUTRITION DIAGNOSIS:   Inadequate oral intake related to acute illness as evidenced by meal completion < 25%.  GOAL:   Patient will meet greater than or equal to 90% of their needs  MONITOR:    (Energy Intake, Electrolyte and Renal Profile, Anthropometrics, Skin)   ASSESSMENT:   Pt admitted from behavioral health unit for PICC line placement for ECT treatments.  Pt out of room this am as pt going to ECT treatment.  Diet Order:  Diet NPO time specified for ECT treatments   Current Nutrition: Per Ayo, CNA pt ate 100% of breakfast this am including eggs, home fries, coffee, orange juice and half of fruit cup. Limited documentation of meals otherwise.   Gastrointestinal Profile: Last BM: 07/20/2015 documented   Medications: vitamin D, colace, Novolog, Megace, Remeron, Senokot, NS at 120mL/hr  Electrolyte/Renal Profile and Glucose Profile:   Recent Labs Lab 07/25/15 2344 07/30/15 0025  NA 144  --   K 4.7  --   CL 104  --   CO2 30  --   BUN 22*  --   CREATININE 1.34* 1.25*  CALCIUM 9.5  --   GLUCOSE 186*  --    Protein Profile: No results for input(s): ALBUMIN in the last 168 hours.    Weight Trend since Admission: Filed Weights   07/29/15 2004 07/30/15 1205  Weight: 196 lb 1.6 oz (88.95 kg) 207 lb (93.895 kg)     Skin:   (Deep tissure injury to left heel)   BMI:  Body mass index is 28.07 kg/(m^2).  Estimated Nutritional Needs:   Kcal:  2346-2773kcals, BEE: 1778kcals, TEE: (IF 1.1-1.3)(AF 1.2)   Protein:  94-113g protein  (1.0-1.2g/kg)   Fluid:  2350-2834mL of fluid (25-36mL/kg)  EDUCATION NEEDS:   Education needs no appropriate at this time   MODERATE Care Level  Leda Quail, RD, LDN Pager [phone]

## 2015-08-01 NOTE — Anesthesia Preprocedure Evaluation (Signed)
Anesthesia Evaluation    Reviewed: Allergy & Precautions, NPO status   Airway Mallampati: III       Dental  (+) Poor Dentition   Pulmonary sleep apnea ,    + decreased breath sounds      Cardiovascular hypertension, Pt. on home beta blockers +CHF Normal cardiovascular exam    Neuro/Psych    GI/Hepatic negative GI ROS, Neg liver ROS,   Endo/Other  diabetes, Type 1, Insulin Dependent  Renal/GU CRFRenal disease     Musculoskeletal negative musculoskeletal ROS (+)   Abdominal (+) + obese,   Peds  Hematology negative hematology ROS (+)   Anesthesia Other Findings   Reproductive/Obstetrics negative OB ROS                             Anesthesia Physical Anesthesia Plan  ASA: III  Anesthesia Plan: General   Post-op Pain Management:    Induction: Intravenous  Airway Management Planned: Simple Face Mask  Additional Equipment:   Intra-op Plan:   Post-operative Plan:   Informed Consent: I have reviewed the patients History and Physical, chart, labs and discussed the procedure including the risks, benefits and alternatives for the proposed anesthesia with the patient or authorized representative who has indicated his/her understanding and acceptance.     Plan Discussed with: CRNA  Anesthesia Plan Comments:         Anesthesia Quick Evaluation

## 2015-08-01 NOTE — Progress Notes (Signed)
Patient ID: Christian [name], male   DOB: [birth year], 64 y.o.   MRN: [id number]  [facility] PROGRESS NOTE  PCP: Leanor Rubenstein, MD  HPI/Subjective:  Seen  today, had some agitation and received Ativan this morning.reviewed the nurses note.      Objective: Filed Vitals:   08/01/15 0451  BP: 187/99  Pulse: 116  Temp: 99.5 F (37.5 C)  Resp: 20   ROS: Review of Systems  Unable to perform ROS  secondary to severe depression patient's refuses to complaint Exam: Physical Exam  HENT:  Nose: No mucosal edema.  Mouth/Throat: No oropharyngeal exudate or posterior oropharyngeal edema.  Eyes: Conjunctivae, EOM and lids are normal. Pupils are equal, round, and reactive to light.  Neck: No JVD present. Carotid bruit is not present. No edema present. No thyroid mass and no thyromegaly present.  Cardiovascular: S1 normal and S2 normal.   present.  Exam reveals no gallop.   No murmur heard. Pulses:      Dorsalis pedis pulses are 2+ on the right side, and 2+ on the left side.  Respiratory: No respiratory distress. He has no wheezes. He has no rhonchi. He has no rales.  GI: Soft. Bowel sounds are normal. There is no tenderness.  Musculoskeletal:       Right ankle: He exhibits  no swelling       Left ankle: He exhibits no swelling.  Lymphadenopathy:    He has no cervical adenopathy.  Neurological: He is alert. No cranial nerve deficit.  Skin: Skin is warm. No rash noted. Nails show no clubbing.  Large Left medial foot blood blister with skin intact.  Psychiatric: His affect is blunt.    Data Reviewed: Basic Metabolic Panel:  Recent Labs Lab 07/25/15 2344 07/30/15 0025  NA 144  --   K 4.7  --   CL 104  --   CO2 30  --   GLUCOSE 186*  --   BUN 22*  --   CREATININE 1.34* 1.25*  CALCIUM 9.5  --    CBC:  Recent Labs Lab 07/25/15 2344 07/30/15 0025  WBC 6.9 4.3  HGB 17.0 15.4  HCT 51.5 47.2  MCV 91.2 90.7  PLT 257 197    Scheduled Meds: . allopurinol   300 mg Oral Daily  . atorvastatin  10 mg Oral Daily  . cholecalciferol  800 Units Oral Daily  . clonazePAM  1 mg Oral QHS  . colchicine  0.6 mg Oral Daily  . docusate sodium  200 mg Oral BID  . enoxaparin (LOVENOX) injection  40 mg Subcutaneous Q24H  . feeding supplement (GLUCERNA SHAKE)  237 mL Oral TID WC  . hydrALAZINE  25 mg Oral 4 times per day  . insulin aspart  0-9 Units Subcutaneous TID WC  . megestrol  400 mg Oral Daily  . metoprolol  200 mg Oral Daily  . mirtazapine  30 mg Oral QHS  . OLANZapine  10 mg Intramuscular BID AC  . senna  1 tablet Oral Daily  . traZODone  25 mg Oral TID    Assessment/Plan:  1. Severe depression: Continue ECT treatment as per psychiatry.  2. Dehydration, acute on chronic kidney disease stage III.-Continue IV fluids for now , 3. Essential hypertension,- continue metoprolol,Cardizem CD  And hydralazine. 4. Diabetes type 2- sliding-scale , 5. Hyperlipidemia unspecified- hold atorvastatin 6. History of gout on allopurinol and colchicine. Code Status:     Code Status Orders  Start     Ordered   07/16/15 1213  Full code   Continuous     07/16/15 1213     Disposition Plan: To be determined  Time spent: 32 minutes minutes  [facility] Hospitalists

## 2015-08-01 NOTE — Consult Note (Signed)
[facility] Face-to-Face Psychiatry Consult   Reason for Consult:  Follow-up for this 64 year old man with bipolar disorder severe with psychotic depression Referring Physician:  Luberta Mutter Patient Identification: Christian [name] MRN:  [id number] Principal Diagnosis: Bipolar disorder, current episode depressed, severe, with psychotic features Diagnosis:   Patient Active Problem List   Diagnosis Date Noted  . Pressure ulcer [L89.90] 07/30/2015  . Protein-calorie malnutrition, severe [E43] 07/30/2015  . Dehydration [E86.0] 07/29/2015  . Bipolar disorder, current episode depressed, severe, with psychotic features [F31.5] 07/09/2015  . Diabetes [E11.9] 07/09/2015  . UTI (urinary tract infection) [N39.0] 06/19/2014  . Positive blood culture [R78.81] 06/19/2014  . Bipolar affective disorder, current episode manic with psychotic symptoms [F31.2] 06/17/2014  . Type II or unspecified type diabetes mellitus with unspecified complication, uncontrolled [E11.8] 06/02/2014  . Other and unspecified hyperlipidemia [E78.5] 06/02/2014  . OSA on CPAP [G47.33] 06/02/2014  . 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 Gout [M10.9] 06/02/2014  . Hypertension [I10]     Total Time spent with patient: 45 minutes  Subjective:   Christian [name] is a 64 y.o. male patient admitted with patient himself continues to describe himself as depressed. Says he doesn't care whether he lives or dies. Doesn't feel like he has any motivation to do anything at all. Not complaining of specific pain but will moan occasionally about being in pain all over.  HPI:  See previous full notes. This 64 year old man has severe bipolar disorder and is currently in an extended psychotic depression. He had stopped eating and stopped caring for himself and was passively suicidal. Patient is being treated with ECT because of failure to respond to other medication. Because of the impossibility of starting a  regular IV on him for ECT I have failed on the medicine team to allow him to be on the medicine service for now to have a PICC line in place. Patient had ECT right unilateral this afternoon. Tolerated treatment well. HPI Elements:   Quality:  Severe depression with suicidal-like behavior. Severity:  Severe and life-threatening. Timing:  Lifelong problem worse for a few months. Duration:  Ongoing issue.  Past Medical History:  Past Medical History  Diagnosis Date  . Mood swings   . Type II or unspecified type diabetes mellitus with unspecified complication, uncontrolled 06/02/2014  . Other and unspecified hyperlipidemia 06/02/2014  . OSA on CPAP 06/02/2014  . 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 Bipolar disorder   . CHF (congestive heart failure)   . CKD (chronic kidney disease), stage III   . Diabetes mellitus without complication   . Hypertension   . Gout   . Bipolar 1 disorder   . OSA on CPAP    History reviewed. No pertinent past surgical history. Family History:  Family History  Problem Relation Age of Onset  . Dementia Mother   . Arthritis Father   . Heart failure Neg Hx   . Kidney failure Neg Hx   . Cancer Neg Hx    Social History:  History  Alcohol Use No     History  Drug Use No    Social History   Social History  . Marital Status: Married    Spouse Name: N/A  . Number of Children: N/A  . Years of Education: N/A   Social History Main Topics  . Smoking status: Never Smoker   . Smokeless tobacco: None  . Alcohol Use: No  . Drug  Use: No  . Sexual Activity: Not Currently   Other Topics Concern  . None   Social History Narrative   ** Merged History Encounter **       Additional Social History:                          Allergies:  No Known Allergies  Labs:  Results for orders placed or performed during the hospital encounter of 07/29/15 (from the past 48 hour(s))  Glucose, capillary     Status:  Abnormal   Collection Time: 07/30/15  9:17 PM  Result Value Ref Range   Glucose-Capillary 225 (H) 65 - 99 mg/dL  Glucose, capillary     Status: Abnormal   Collection Time: 07/31/15  7:24 AM  Result Value Ref Range   Glucose-Capillary 207 (H) 65 - 99 mg/dL  Glucose, capillary     Status: Abnormal   Collection Time: 07/31/15 11:03 AM  Result Value Ref Range   Glucose-Capillary 168 (H) 65 - 99 mg/dL  Glucose, capillary     Status: Abnormal   Collection Time: 07/31/15  4:09 PM  Result Value Ref Range   Glucose-Capillary 163 (H) 65 - 99 mg/dL  Glucose, capillary     Status: Abnormal   Collection Time: 07/31/15  9:49 PM  Result Value Ref Range   Glucose-Capillary 137 (H) 65 - 99 mg/dL  Glucose, capillary     Status: Abnormal   Collection Time: 08/01/15  7:24 AM  Result Value Ref Range   Glucose-Capillary 146 (H) 65 - 99 mg/dL    Vitals: Blood pressure 146/103, pulse 95, temperature 98.4 F (36.9 C), temperature source Oral, resp. rate 26, height 6' (1.829 m), weight 93.895 kg (207 lb), SpO2 99 %.  Risk to Self: Is patient at risk for suicide?: Yes Risk to Others:   Prior Inpatient Therapy:   Prior Outpatient Therapy:    Current Facility-Administered Medications  Medication Dose Route Frequency Provider Last Rate Last Dose  . 0.9 %  sodium chloride infusion   Intravenous Continuous Houston Siren, MD 125 mL/hr at 08/01/15 1000    . 0.9 %  sodium chloride infusion  250 mL Intravenous Once Audery Amel, MD      . acetaminophen (TYLENOL) tablet 650 mg  650 mg Oral Q6H PRN Houston Siren, MD   650 mg at 07/31/15 1610   Or  . acetaminophen (TYLENOL) suppository 650 mg  650 mg Rectal Q6H PRN Houston Siren, MD      . allopurinol (ZYLOPRIM) tablet 300 mg  300 mg Oral Daily Houston Siren, MD   300 mg at 07/30/15 1000  . atorvastatin (LIPITOR) tablet 10 mg  10 mg Oral Daily Houston Siren, MD   10 mg at 07/30/15 1000  . cholecalciferol (VITAMIN D) tablet 800 Units  800 Units Oral  Daily Houston Siren, MD   800 Units at 07/30/15 1000  . clonazePAM (KLONOPIN) tablet 1 mg  1 mg Oral QHS Houston Siren, MD   1 mg at 07/30/15 2310  . colchicine tablet 0.6 mg  0.6 mg Oral Daily Houston Siren, MD   0.6 mg at 07/30/15 1701  . docusate sodium (COLACE) capsule 200 mg  200 mg Oral BID Auburn Bilberry, MD   200 mg at 07/31/15 1235  . enoxaparin (LOVENOX) injection 40 mg  40 mg Subcutaneous Q24H Houston Siren, MD   40 mg at 07/30/15 2309  .  feeding supplement (GLUCERNA SHAKE) (GLUCERNA SHAKE) liquid 237 mL  237 mL Oral TID WC Houston Siren, MD   237 mL at 07/31/15 1741  . hydrALAZINE (APRESOLINE) tablet 25 mg  25 mg Oral 4 times per day Houston Siren, MD   25 mg at 08/01/15 0455  . insulin aspart (novoLOG) injection 0-9 Units  0-9 Units Subcutaneous TID WC Auburn Bilberry, MD   2 Units at 07/31/15 1324  . megestrol (MEGACE) 400 MG/10ML suspension 400 mg  400 mg Oral Daily Houston Siren, MD   400 mg at 07/30/15 1701  . metoCLOPramide (REGLAN) injection 10 mg  10 mg Intravenous Once PRN Amy Rice, MD      . metoCLOPramide (REGLAN) injection 10 mg  10 mg Intravenous Once Audery Amel, MD      . metoprolol succinate (TOPROL-XL) 24 hr tablet 200 mg  200 mg Oral Daily Houston Siren, MD   200 mg at 07/30/15 1701  . mirtazapine (REMERON) tablet 30 mg  30 mg Oral QHS Houston Siren, MD   30 mg at 07/30/15 2309  . OLANZapine (ZYPREXA) injection 10 mg  10 mg Intramuscular BID AC Houston Siren, MD   10 mg at 07/31/15 1220  . ondansetron (ZOFRAN) tablet 4 mg  4 mg Oral Q6H PRN Houston Siren, MD       Or  . ondansetron (ZOFRAN) injection 4 mg  4 mg Intravenous Q6H PRN Houston Siren, MD      . polyethylene glycol (MIRALAX / GLYCOLAX) packet 17 g  17 g Oral Once Katha Hamming, MD      . senna (SENOKOT) tablet 8.6 mg  1 tablet Oral Daily Auburn Bilberry, MD   8.6 mg at 07/31/15 1234  . traZODone (DESYREL) tablet 25 mg  25 mg Oral TID Houston Siren, MD   25 mg at 07/30/15 2309     Musculoskeletal: Strength & Muscle Tone: decreased Gait & Station: unable to stand Patient leans: N/A  Psychiatric Specialty Exam: Physical Exam  Nursing note and vitals reviewed. Constitutional: He appears well-developed. He appears lethargic. He is uncooperative. He has a sickly appearance.  HENT:  Head: Normocephalic and atraumatic.  Eyes: Conjunctivae are normal. Pupils are equal, round, and reactive to light.  Neck: Normal range of motion.  Cardiovascular: Normal heart sounds.   Respiratory: Effort normal.  GI: Soft.  Musculoskeletal: Normal range of motion.  Neurological: He appears lethargic.  Skin: Skin is warm and dry.  Psychiatric: His affect is blunt. His speech is delayed. He is slowed and withdrawn. Thought content is delusional. Cognition and memory are impaired. He expresses inappropriate judgment. He exhibits a depressed mood. He is noncommunicative.    Review of Systems  Psychiatric/Behavioral: Positive for depression and memory loss. Negative for suicidal ideas, hallucinations and substance abuse. The patient is nervous/anxious.     Blood pressure 146/103, pulse 95, temperature 98.4 F (36.9 C), temperature source Oral, resp. rate 26, height 6' (1.829 m), weight 93.895 kg (207 lb), SpO2 99 %.Body mass index is 28.07 kg/(m^2).  General Appearance: Disheveled  Eye Contact::  Poor  Speech:  Slow  Volume:  Decreased  Mood:  Hopeless  Affect:  Depressed  Thought Process:  Tangential  Orientation:  Negative  Thought Content:  Delusions  Suicidal Thoughts:  Yes.  without intent/plan  Homicidal Thoughts:  No  Memory:  Negative  Judgement:  Poor  Insight:  Lacking  Psychomotor Activity:  Mannerisms  Concentration:  Poor  Recall:  Poor  Fund of Knowledge:Poor  Language: Poor  Akathisia:  No  Handed:  Right  AIMS (if indicated):     Assets:  Financial Resources/Insurance Housing Intimacy Social Support  ADL's:  Impaired  Cognition: Impaired,  Moderate   Sleep:      Medical Decision Making: Established Problem, Stable/Improving (1), Review of Psycho-Social Stressors (1), Review or order clinical lab tests (1) and Review of Medication Regimen & Side Effects (2)  Treatment Plan Summary: Daily contact with patient to assess and evaluate symptoms and progress in treatment, Medication management and Plan I really appreciate medicine continuing to allow him to be on the medical service. ECT was tolerated very well today. I'm hopeful that he will continue to improve over the weekend. Unfortunately we will not be performing treatment on Monday because of the holiday. Continue current medication. Next treatment scheduled for Wednesday area patient is aware of it. He looks like he is getting a little better. He made a little bit more eye contact today and is not complaining about treatment and is actually slightly more cooperative.  Plan:  Supportive therapy provided about ongoing stressors. Disposition: Continue current treatment plan next ECT Wednesday. Please contact psychiatrist on call over the long weekend if needed.  Carole Doner 08/01/2015 6:07 PM

## 2015-08-01 NOTE — Transfer of Care (Signed)
Immediate Anesthesia Transfer of Care Note  Patient: Christian [name]  Procedure(s) Performed: ect  Patient Location: PACU  Anesthesia Type:General  Level of Consciousness: sedated  Airway & Oxygen Therapy: Patient Spontanous Breathing and Patient connected to face mask oxygen  Post-op Assessment: Report given to RN  Post vital signs: Reviewed  Last Vitals:  Filed Vitals:   08/01/15 1714  BP: 147/109  Pulse: 100  Temp: 36.9 C  Resp: 25    Complications: No apparent anesthesia complications

## 2015-08-01 NOTE — Progress Notes (Signed)
On call MD notified pt very agitated, restless, & uncooperative this morning pulling at picc & foley catheter. Telephone order for 0.5mg  Ativan IV once. Will continue to assess.

## 2015-08-01 NOTE — Anesthesia Postprocedure Evaluation (Signed)
  Anesthesia Post-op Note  Patient: Christian [name]  Procedure(s) Performed: * No procedures listed *  Anesthesia type:General  Patient location: PACU  Post pain: Pain level controlled  Post assessment: Post-op Vital signs reviewed, Patient's Cardiovascular Status Stable, Respiratory Function Stable, Patent Airway and No signs of Nausea or vomiting  Post vital signs: Reviewed and stable  Last Vitals:  Filed Vitals:   08/01/15 1716  BP: 147/109  Pulse:   Temp: 36.9 C  Resp:     Level of consciousness: awake, alert  and patient cooperative  Complications: No apparent anesthesia complications

## 2015-08-01 NOTE — Progress Notes (Signed)
Patient ID: Christian [name], male   DOB: Jul 27, 1951, 64 y.o.   MRN: [id number] Patient was accidentally fed this morning. His need for treatment is an emergency due to continued lack of regular po intake, and severe psychotic depression that has kept him on medicine to receive IV treatment. Case discussed with Dr Maisie Fus of anesthesia. We will plan for treatment this afternoon at 16:00, at least 8 hours past feeding. Single dose IV reglan to assist stomach emptying.   Patient must be NPO until treatment. Diet changed to NPO now to emphasize this. Patient will be brought back this afternoon in anticipation of ECT. Appreciate help from anesthesia.

## 2015-08-02 LAB — GLUCOSE, CAPILLARY
Glucose-Capillary: 96 mg/dL (ref 65–99)
Glucose-Capillary: 119 mg/dL — ABNORMAL HIGH (ref 65–99)
Glucose-Capillary: 187 mg/dL — ABNORMAL HIGH (ref 65–99)
Glucose-Capillary: 177 mg/dL — ABNORMAL HIGH (ref 65–99)

## 2015-08-02 NOTE — Plan of Care (Signed)
Problem: Discharge Progression Outcomes Goal: Other Discharge Outcomes/Goals Outcome: Progressing Plan of care progress to goal for: 1. Discharge Plan:         No Discharge Plan at Current Time.          2. Pain:         Denies pain. 3. Hemodynamically Stable:         VSS.         Afebrile.         Remains on IVF's.         Foley Catheter In Place with Clear Yellow Urine Output.           4. Complications:         Sitter at Bedside 24:7. 5. Diet:         Carb Controlled Diet. Decreased Appetite. 6. Activity:         Bedrest.        

## 2015-08-02 NOTE — Progress Notes (Signed)
Patient ID: Christian [name], male   DOB: March 23, 1951, 64 y.o.   MRN: [id number]  [facility] PROGRESS NOTE  PCP: Leanor Rubenstein, MD  HPI/Subjective:  Seen today, says he doesn't feel good. Refusing to talk to me.      Objective: Filed Vitals:   08/02/15 0454  BP: 145/86  Pulse: 101  Temp: 98.6 F (37 C)  Resp:    ROS: Review of Systems  Unable to perform ROS  secondary to severe depression patient's refuses to complaint Exam: Physical Exam  HENT:  Nose: No mucosal edema.  Mouth/Throat: No oropharyngeal exudate or posterior oropharyngeal edema.  Eyes: Conjunctivae, EOM and lids are normal. Pupils are equal, round, and reactive to light.  Neck: No JVD present. Carotid bruit is not present. No edema present. No thyroid mass and no thyromegaly present.  Cardiovascular: S1 normal and S2 normal.   present.  Exam reveals no gallop.   No murmur heard. Pulses:      Dorsalis pedis pulses are 2+ on the right side, and 2+ on the left side.  Respiratory: No respiratory distress. He has no wheezes. He has no rhonchi. He has no rales.  GI: Soft. Bowel sounds are normal. There is no tenderness.  Musculoskeletal:       Right ankle: He exhibits  no swelling       Left ankle: He exhibits no swelling.  Lymphadenopathy:    He has no cervical adenopathy.  Neurological: He is alert. No cranial nerve deficit.  Skin: Skin is warm. No rash noted. Nails show no clubbing.  Large Left medial foot blood blister with skin intact.  Psychiatric: His affect is blunt.    Data Reviewed: Basic Metabolic Panel:  Recent Labs Lab 07/30/15 0025  CREATININE 1.25*   CBC:  Recent Labs Lab 07/30/15 0025  WBC 4.3  HGB 15.4  HCT 47.2  MCV 90.7  PLT 197    Scheduled Meds: . allopurinol  300 mg Oral Daily  . atorvastatin  10 mg Oral Daily  . cholecalciferol  800 Units Oral Daily  . clonazePAM  1 mg Oral QHS  . colchicine  0.6 mg Oral Daily  . docusate sodium  200 mg Oral BID   . enoxaparin (LOVENOX) injection  40 mg Subcutaneous Q24H  . feeding supplement (GLUCERNA SHAKE)  237 mL Oral TID WC  . hydrALAZINE  25 mg Oral 4 times per day  . insulin aspart  0-9 Units Subcutaneous TID WC  . megestrol  400 mg Oral Daily  . metoCLOPramide (REGLAN) injection  10 mg Intravenous Once  . metoprolol  200 mg Oral Daily  . mirtazapine  45 mg Oral QHS  . OLANZapine  10 mg Intramuscular BID AC  . polyethylene glycol  17 g Oral Daily  . senna  1 tablet Oral Daily  . traZODone  25 mg Oral TID    Assessment/Plan:  1. Severe depression: Continue ECT treatment as per psychiatry.  Also on Klonopin , Zyprexa, Remeron. Had ECT treatment yesterday. Next ECT treatment on Wednesday. 2. Dehydration, acute on chronic kidney disease stage III.-Clinically improving, decrease IV fluids.  3. Essential hypertension,- continue metoprolol,Cardizem CD  And hydralazine. Controlled. 4. Diabetes type 2- sliding-scale , with coverage. Blood glucose overall well controlled. 5. Hyperlipidemia unspecified- 6. History of gout on allopurinol and colchicine. Code Status:     Code Status Orders        Start     Ordered   07/16/15 1213  Full code  Continuous     07/16/15 1213     Disposition Plan: To be determined  Time spent: 32 minutes minutes  [facility]

## 2015-08-02 NOTE — Plan of Care (Signed)
Problem: Discharge Progression Outcomes Goal: Other Discharge Outcomes/Goals Outcome: Not Progressing Pt very lethargic throughout shift tonight, refusing all medications asked not be bothered, patient very flat, little interaction, eyes shut even when he appears awake, VSS, no complaints of pain, no agitation noticed, sitter remained at bedside,

## 2015-08-02 NOTE — Plan of Care (Signed)
Problem: Discharge Progression Outcomes Goal: Other Discharge Outcomes/Goals Outcome: Progressing Plan of care progress to goal for: 1. Discharge Plan:         No Discharge Plan at Current Time.          2. Pain:         Denies pain. 3. Hemodynamically Stable:         VSS.         Afebrile.         Remains on IVF's.         Foley Catheter In Place with Clear Yellow Urine Output.           4. Complications:         Sitter at Bedside 24:7. 5. Diet:         Carb Controlled Diet. Decreased Appetite. 6. Activity:         Bedrest.

## 2015-08-03 LAB — CBC
HCT: 40.1 % (ref 40.0–52.0)
Hemoglobin: 13.4 g/dL (ref 13.0–18.0)
MCH: 29.7 pg (ref 26.0–34.0)
MCHC: 33.5 g/dL (ref 32.0–36.0)
MCV: 88.5 fL (ref 80.0–100.0)
Platelets: 173 10*3/uL (ref 150–440)
RBC: 4.53 MIL/uL (ref 4.40–5.90)
RDW: 12.5 % (ref 11.5–14.5)
WBC: 4.4 10*3/uL (ref 3.8–10.6)

## 2015-08-03 LAB — GLUCOSE, CAPILLARY
Glucose-Capillary: 143 mg/dL — ABNORMAL HIGH (ref 65–99)
Glucose-Capillary: 238 mg/dL — ABNORMAL HIGH (ref 65–99)
Glucose-Capillary: 227 mg/dL — ABNORMAL HIGH (ref 65–99)
Glucose-Capillary: 154 mg/dL — ABNORMAL HIGH (ref 65–99)

## 2015-08-03 LAB — CREATININE, SERUM
Creatinine, Ser: 0.84 mg/dL (ref 0.61–1.24)
GFR calc Af Amer: >60 mL/min (ref >60)
GFR calc non Af Amer: >60 mL/min (ref >60)

## 2015-08-03 MED ORDER — HYDRALAZINE HCL 50 MG PO TABS
50.0000 mg | ORAL_TABLET | Freq: Four times a day (QID) | ORAL | Status: DC
  Administered 2015-08-03 – 2015-08-27 (×67): 50 mg via ORAL
  Filled 2015-08-03 (×80): qty 1

## 2015-08-03 NOTE — Progress Notes (Signed)
Patient ID: Christian [name], male   DOB: Jan 23, 1951, 64 y.o.   MRN: [id number]  [facility] Physicians PROGRESS NOTE  PCP: Leanor Rubenstein, MD  HPI/Subjective:  In today. Patient refusing to take by mouth medications. Also pulling of all the blankets. Into the sitter at bedside he ate well today, did not eat  Well .yesterday.     Objective: Filed Vitals:   08/03/15 0456  BP: 160/104  Pulse: 107  Temp: 99.3 F (37.4 C)  Resp: 20   ROS: Review of Systems  Unable to perform ROS  secondary to severe depression patient's refuses to complaint Exam: Physical Exam  HENT:  Nose: No mucosal edema.  Mouth/Throat: No oropharyngeal exudate or posterior oropharyngeal edema.  Eyes: Conjunctivae, EOM and lids are normal. Pupils are equal, round, and reactive to light.  Neck: No JVD present. Carotid bruit is not present. No edema present. No thyroid mass and no thyromegaly present.  Cardiovascular: S1 normal and S2 normal.   present.  Exam reveals no gallop.   No murmur heard. Pulses:      Dorsalis pedis pulses are 2+ on the right side, and 2+ on the left side.  Respiratory: No respiratory distress. He has no wheezes. He has no rhonchi. He has no rales.  GI: Soft. Bowel sounds are normal. There is no tenderness.  Musculoskeletal:       Right ankle: He exhibits  no swelling       Left ankle: He exhibits no swelling.  Lymphadenopathy:    He has no cervical adenopathy.  Neurological: He is alert. No cranial nerve deficit.  Skin: Skin is warm. No rash noted. Nails show no clubbing.  Large Left medial foot blood blister with skin intact.  Psychiatric: His affect is blunt.    Data Reviewed: Basic Metabolic Panel:  Recent Labs Lab 07/30/15 0025 08/03/15 0507  CREATININE 1.25* 0.84   CBC:  Recent Labs Lab 07/30/15 0025  WBC 4.3  HGB 15.4  HCT 47.2  MCV 90.7  PLT 197    Scheduled Meds: . allopurinol  300 mg Oral Daily  . atorvastatin  10 mg Oral Daily  .  cholecalciferol  800 Units Oral Daily  . clonazePAM  1 mg Oral QHS  . colchicine  0.6 mg Oral Daily  . docusate sodium  200 mg Oral BID  . enoxaparin (LOVENOX) injection  40 mg Subcutaneous Q24H  . feeding supplement (GLUCERNA SHAKE)  237 mL Oral TID WC  . hydrALAZINE  25 mg Oral 4 times per day  . insulin aspart  0-9 Units Subcutaneous TID WC  . megestrol  400 mg Oral Daily  . metoCLOPramide (REGLAN) injection  10 mg Intravenous Once  . metoprolol  200 mg Oral Daily  . mirtazapine  45 mg Oral QHS  . OLANZapine  10 mg Intramuscular BID AC  . polyethylene glycol  17 g Oral Daily  . senna  1 tablet Oral Daily  . traZODone  25 mg Oral TID    Assessment/Plan:  1. Severe depression: Continue ECT treatment as per psychiatry.  Also on Klonopin , Zyprexa, Remeron. Had ECT treatmenon 9/2. t . Next ECT treatment on Wednesday. Still  depressed. Refusing to take by mouth medications. Continue fluids. Not able to get up and and go to bathroom so Foley is inserted and continue the Foley. 2. Dehydration, acute on chronic kidney disease stage III.-Clinically improving, decreased IV fluids.  3. Essential hypertension,- continue metoprolol,Cardizem CD  And hydralazine. Controlled. 4. Diabetes  type 2- sliding-scale , with coverage. Blood glucose overall well controlled. 5. Hyperlipidemia unspecified- 6. History of gout on allopurinol and colchicine. Code Status:     Code Status Orders        Start     Ordered   07/16/15 1213  Full code   Continuous     07/16/15 1213     Disposition Plan: To be determined  Time spent: 32 minutes minutes  [facility]

## 2015-08-03 NOTE — Progress Notes (Signed)
Pt foley catheter securement device came off, went to place new attachment device on and pt refused. Instructed pt of possible foley being pulled off if not placed verbalized understanding

## 2015-08-03 NOTE — Plan of Care (Signed)
Problem: Discharge Progression Outcomes Goal: Discharge plan in place and appropriate Outcome: Progressing No discharge plan at this time Goal: Hemodynamically stable Outcome: Not Progressing Plan of care to goal  Depression  Continues  On meds  ect tx cont next one   Wed per md.  htn b/p elevated. Pt has been refusing  meds  Took meds for me today.  B/p meds given   bp came down some. Wife at bedside this pm Denies  Pain c/o.  Goal: Tolerating diet Outcome: Progressing Ate breakfast well.  Ate banana  At lunch Goal: Activity appropriate for discharge plan Outcome: Not Progressing Bedrest / sitter at bedside

## 2015-08-04 LAB — GLUCOSE, CAPILLARY
Glucose-Capillary: 142 mg/dL — ABNORMAL HIGH (ref 65–99)
Glucose-Capillary: 194 mg/dL — ABNORMAL HIGH (ref 65–99)
Glucose-Capillary: 119 mg/dL — ABNORMAL HIGH (ref 65–99)
Glucose-Capillary: 117 mg/dL — ABNORMAL HIGH (ref 65–99)

## 2015-08-04 NOTE — Care Management Important Message (Signed)
Important Message  Patient Details  Name: Christian [name] MRN: [id number] Date of Birth: [birth year]   Medicare Important Message Given:  Yes-fourth notification given    Gwenette Greet, RN 08/04/2015, 11:36 AM

## 2015-08-04 NOTE — Plan of Care (Signed)
Problem: Discharge Progression Outcomes Goal: Other Discharge Outcomes/Goals Plan of care progress to goal: Hemodynamically: VSS Diet: ate bedtime snack Activity: positions self; sitter at bedside

## 2015-08-04 NOTE — Progress Notes (Signed)
Patient ID: Christian [name], male   DOB: [birth year], 64 y.o.   MRN: [id number]  [facility] Physicians PROGRESS NOTE  PCP: Leanor Rubenstein, MD  HPI/Subjective:  The seen this morning. Initially refused to take medications yesterday but registered nurse notes he did take medicines later on.      Objective: Filed Vitals:   08/04/15 0500  BP: 159/94  Pulse: 101  Temp: 98.7 F (37.1 C)  Resp: 18   ROS: Review of Systems  Unable to perform ROS  secondary to severe depression patient's refuses to complaint Exam: Physical Exam  HENT:  Nose: No mucosal edema.  Mouth/Throat: No oropharyngeal exudate or posterior oropharyngeal edema.  Eyes: Conjunctivae, EOM and lids are normal. Pupils are equal, round, and reactive to light.  Neck: No JVD present. Carotid bruit is not present. No edema present. No thyroid mass and no thyromegaly present.  Cardiovascular: S1 normal and S2 normal.   present.  Exam reveals no gallop.   No murmur heard. Pulses:      Dorsalis pedis pulses are 2+ on the right side, and 2+ on the left side.  Respiratory: No respiratory distress. He has no wheezes. He has no rhonchi. He has no rales.  GI: Soft. Bowel sounds are normal. There is no tenderness.  Musculoskeletal:       Right ankle: He exhibits  no swelling       Left ankle: He exhibits no swelling.  Lymphadenopathy:    He has no cervical adenopathy.  Neurological: He is alert. No cranial nerve deficit.  Skin: Skin is warm. No rash noted. Nails show no clubbing.  Large Left medial foot blood blister with skin intact.  Psychiatric: His affect is blunt.    Data Reviewed: Basic Metabolic Panel:  Recent Labs Lab 07/30/15 0025 08/03/15 0507  CREATININE 1.25* 0.84   CBC:  Recent Labs Lab 07/30/15 0025 08/03/15 0507  WBC 4.3 4.4  HGB 15.4 13.4  HCT 47.2 40.1  MCV 90.7 88.5  PLT 197 173    Scheduled Meds: . allopurinol  300 mg Oral Daily  . atorvastatin  10 mg Oral Daily  .  cholecalciferol  800 Units Oral Daily  . clonazePAM  1 mg Oral QHS  . colchicine  0.6 mg Oral Daily  . docusate sodium  200 mg Oral BID  . enoxaparin (LOVENOX) injection  40 mg Subcutaneous Q24H  . feeding supplement (GLUCERNA SHAKE)  237 mL Oral TID WC  . hydrALAZINE  50 mg Oral 4 times per day  . insulin aspart  0-9 Units Subcutaneous TID WC  . megestrol  400 mg Oral Daily  . metoCLOPramide (REGLAN) injection  10 mg Intravenous Once  . metoprolol  200 mg Oral Daily  . mirtazapine  45 mg Oral QHS  . OLANZapine  10 mg Intramuscular BID AC  . polyethylene glycol  17 g Oral Daily  . senna  1 tablet Oral Daily  . traZODone  25 mg Oral TID    Assessment/Plan:  1. Severe depression: Continue ECT treatment as per psychiatry.  Also on Klonopin , Zyprexa, Remeron. Had ECT treatmenon 9/2. t . Next ECT treatment on Wednesday. Still  depressed. Refusing to take by mouth medications. Continue fluids. Not able to get up and and go to bathroom so Foley is inserted and continue the Foley. 2. Dehydration, acute on chronic kidney disease stage III.-Clinically improving, decreased IV fluids.  3. Essential hypertension,- continue metoprolol,Cardizem CD  And hydralazine. Controlled. 4. Diabetes type 2-  sliding-scale , with coverage. Blood glucose overall well controlled. Po intake still not reliable. continue sliding scale with coverage only 5. Hyperlipidemia unspecified- 6. History of gout on allopurinol and colchicine. Code Status:     Code Status Orders        Start     Ordered   07/16/15 1213  Full code   Continuous     07/16/15 1213     Disposition Plan: To be determined  Time spent: 32 minutes minutes  [facility] Hospitalists

## 2015-08-05 LAB — GLUCOSE, CAPILLARY: Glucose-Capillary: 183 mg/dL — ABNORMAL HIGH (ref 65–99)

## 2015-08-05 LAB — CREATININE, SERUM
Creatinine, Ser: 0.69 mg/dL (ref 0.61–1.24)
GFR calc Af Amer: >60 mL/min (ref >60)
GFR calc non Af Amer: >60 mL/min (ref >60)

## 2015-08-05 NOTE — Plan of Care (Signed)
Problem: Discharge Progression Outcomes Goal: Other Discharge Outcomes/Goals Outcome: Not Progressing Plan of care progress to goal:  Patient is refusing all medications and blood sugar checks this shift. No PO intake - patient refused. NS infusing at 50 ml/hr. 1:1 sitter at bedside. ECT treatment tomorrow.

## 2015-08-05 NOTE — Progress Notes (Signed)
Patient ID: Christian [name], male   DOB: Sep 10, 1951, 64 y.o.   MRN: [id number]  [facility] Physicians PROGRESS NOTE  PCP: Leanor Rubenstein, MD  HPI/Subjective:  Refusing meds.did not BP meds today.poor po eating/o whole body pain.      Objective: Filed Vitals:   08/05/15 0031  BP: 145/88  Pulse: 106  Temp: 97.4 F (36.3 C)  Resp:    ROS: Review of Systems  Unable to perform ROS,does not  Want to talk.\ical Exam  HENT:  Nose: No mucosal edema.  Mouth/Throat: No oropharyngeal exudate or posterior oropharyngeal edema.  Eyes: Conjunctivae, EOM and lids are normal. Pupils are equal, round, and reactive to light.  Neck: No JVD present. Carotid bruit is not present. No edema present. No thyroid mass and no thyromegaly present.  Cardiovascular: S1 normal and S2 normal.   present.  Exam reveals no gallop.   No murmur heard. Pulses:      Dorsalis pedis pulses are 2+ on the right side, and 2+ on the left side.  Respiratory: No respiratory distress. He has no wheezes. He has no rhonchi. He has no rales.  GI: Soft. Bowel sounds are normal. There is no tenderness.  Musculoskeletal:       Right ankle: He exhibits  no swelling       Left ankle: He exhibits no swelling.  Lymphadenopathy:    He has no cervical adenopathy.  Neurological: He is alert. No cranial nerve deficit.  Skin: Skin is warm. No rash noted. Nails show no clubbing.  Large Left medial foot blood blister with skin intact.  Psychiatric: His affect is blunt.    Data Reviewed: Basic Metabolic Panel:  Recent Labs Lab 07/30/15 0025 08/03/15 0507 08/05/15 0521  CREATININE 1.25* 0.84 0.69   CBC:  Recent Labs Lab 07/30/15 0025 08/03/15 0507  WBC 4.3 4.4  HGB 15.4 13.4  HCT 47.2 40.1  MCV 90.7 88.5  PLT 197 173    Scheduled Meds: . allopurinol  300 mg Oral Daily  . atorvastatin  10 mg Oral Daily  . cholecalciferol  800 Units Oral Daily  . clonazePAM  1 mg Oral QHS  . colchicine  0.6 mg Oral Daily  .  docusate sodium  200 mg Oral BID  . enoxaparin (LOVENOX) injection  40 mg Subcutaneous Q24H  . feeding supplement (GLUCERNA SHAKE)  237 mL Oral TID WC  . hydrALAZINE  50 mg Oral 4 times per day  . insulin aspart  0-9 Units Subcutaneous TID WC  . megestrol  400 mg Oral Daily  . metoCLOPramide (REGLAN) injection  10 mg Intravenous Once  . metoprolol  200 mg Oral Daily  . mirtazapine  45 mg Oral QHS  . OLANZapine  10 mg Intramuscular BID AC  . polyethylene glycol  17 g Oral Daily  . senna  1 tablet Oral Daily  . traZODone  25 mg Oral TID    Assessment/Plan:  1. Severe depression: Continue ECT treatment as per psychiatry.  Also on Klonopin , Zyprexa, Remeron. Had ECT treatmenon 9/2. t . Next ECT treatment on Wednesday. Still  depressed. Refusing to take by mouth medications.needs IV line for ECT but had difficulty obtaining IV,so received PICC line.pt can not be  In BHU with PICC line. Continue fluids. Not able to get up and and go to bathroom so Foley is inserted and continue the Foley. 2. Dehydration, acute on chronic kidney disease stage III.-Clinically improving, decreased IV fluids.  3. Essential hypertension,- continue metoprolol,Cardizem  CD  And hydralazine. Controlled. 4. Diabetes type 2- sliding-scale , with coverage. Blood glucose overall well controlled. Po intake still not reliable. continue sliding scale with coverage only 5. Hyperlipidemia unspecified- 6. History of gout on allopurinol and colchicine. Code Status:     Code Status Orders        Start     Ordered   07/16/15 1213  Full code   Continuous     07/16/15 1213     Disposition Plan: To be determined  Time spent: 32 minutes minutes  [facility] Hospitalists

## 2015-08-05 NOTE — Plan of Care (Signed)
Problem: Discharge Progression Outcomes Goal: Other Discharge Outcomes/Goals Plan of care progress to goal: Hemodynamically: BP border line, refused all meds Diet: no appetite tonight Activity: one assist  For ECT tomorrow

## 2015-08-05 NOTE — Consult Note (Signed)
[place] Psychiatry Consult   Reason for Consult:  Follow-up for this 64 year old man with bipolar disorder severe with psychotic depression Referring Physician:  Vianne Bulls Patient Identification: Mort Smelser Frankson MRN:  [id number] Principal Diagnosis: Bipolar disorder, current episode depressed, severe, with psychotic features Diagnosis:   Patient Active Problem List   Diagnosis Date Noted  . Pressure ulcer [L89.90] 07/30/2015  . Protein-calorie malnutrition, severe [E43] 07/30/2015  . Dehydration [E86.0] 07/29/2015  . Bipolar disorder, current episode depressed, severe, with psychotic features [F31.5] 07/09/2015  . Diabetes [E11.9] 07/09/2015  . UTI (urinary tract infection) [N39.0] 06/19/2014  . Positive blood culture [R78.81] 06/19/2014  . Bipolar affective disorder, current episode manic with psychotic symptoms [F31.2] 06/17/2014  . Type II or unspecified type diabetes mellitus with unspecified complication, uncontrolled [E11.8] 06/02/2014  . Other and unspecified hyperlipidemia [E78.5] 06/02/2014  . OSA on CPAP [G47.33] 06/02/2014  . 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 Gout [M10.9] 06/02/2014  . Hypertension [I10]     Total Time spent with patient: 45 minutes  Subjective:   Mars Scheaffer Clune is a 64 y.o. male patient is still not talking much but he tells me that he is doing okay. He is clearly not doing okay but at least he is not complaining as much as he was before. He still is very negative. HPI:  See previous full notes. Patient is receiving bilateral ECT treatment. Last treatment was on Friday. Monday canceled because of holiday. Next treatment scheduled for tomorrow. I got to talk with his wife and the patient tonight. She reports he had some improvement over the weekend that has faded away over the last couple days. We clearly need to be doing multiple treatments in a row. HPI Elements:   Quality:  Severe depression  with suicidal-like behavior. Severity:  Severe and life-threatening. Timing:  Lifelong problem worse for a few months. Duration:  Ongoing issue.  Past Medical History:  Past Medical History  Diagnosis Date  . Mood swings   . Type II or unspecified type diabetes mellitus with unspecified complication, uncontrolled 06/02/2014  . Other and unspecified hyperlipidemia 06/02/2014  . OSA on CPAP 06/02/2014  . 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 Bipolar disorder   . CHF (congestive heart failure)   . CKD (chronic kidney disease), stage III   . Diabetes mellitus without complication   . Hypertension   . Gout   . Bipolar 1 disorder   . OSA on CPAP    History reviewed. No pertinent past surgical history. Family History:  Family History  Problem Relation Age of Onset  . Dementia Mother   . Arthritis Father   . Heart failure Neg Hx   . Kidney failure Neg Hx   . Cancer Neg Hx    Social History:  History  Alcohol Use No     History  Drug Use No    Social History   Social History  . Marital Status: Married    Spouse Name: N/A  . Number of Children: N/A  . Years of Education: N/A   Social History Main Topics  . Smoking status: Never Smoker   . Smokeless tobacco: None  . Alcohol Use: No  . Drug Use: No  . Sexual Activity: Not Currently   Other Topics Concern  . None   Social History Narrative   ** Merged History Encounter **       Additional Social History:  Allergies:  No Known Allergies  Labs:  Results for orders placed or performed during the hospital encounter of 07/29/15 (from the past 48 hour(s))  Glucose, capillary     Status: Abnormal   Collection Time: 08/04/15  7:27 AM  Result Value Ref Range   Glucose-Capillary 142 (H) 65 - 99 mg/dL  Glucose, capillary     Status: Abnormal   Collection Time: 08/04/15 10:51 AM  Result Value Ref Range   Glucose-Capillary 194 (H) 65 - 99 mg/dL   Glucose, capillary     Status: Abnormal   Collection Time: 08/04/15  3:52 PM  Result Value Ref Range   Glucose-Capillary 119 (H) 65 - 99 mg/dL  Glucose, capillary     Status: Abnormal   Collection Time: 08/04/15  9:02 PM  Result Value Ref Range   Glucose-Capillary 117 (H) 65 - 99 mg/dL  Creatinine, serum     Status: None   Collection Time: 08/05/15  5:21 AM  Result Value Ref Range   Creatinine, Ser 0.69 0.61 - 1.24 mg/dL   GFR calc non Af Amer >60 >60 mL/min   GFR calc Af Amer >60 >60 mL/min    Comment: (NOTE) The eGFR has been calculated using the CKD EPI equation. This calculation has not been validated in all clinical situations. eGFR's persistently <60 mL/min signify possible Chronic Kidney Disease.   Glucose, capillary     Status: Abnormal   Collection Time: 08/05/15  8:30 PM  Result Value Ref Range   Glucose-Capillary 183 (H) 65 - 99 mg/dL   Comment 1 Notify RN     Vitals: Blood pressure 152/90, pulse 113, temperature 99.7 F (37.6 C), temperature source Oral, resp. rate 18, height 6' (1.829 m), weight 93.895 kg (207 lb), SpO2 100 %.  Risk to Self: Is patient at risk for suicide?: Yes Risk to Others:   Prior Inpatient Therapy:   Prior Outpatient Therapy:    Current Facility-Administered Medications  Medication Dose Route Frequency Provider Last Rate Last Dose  . 0.9 %  sodium chloride infusion   Intravenous Continuous Epifanio Lesches, MD 50 mL/hr at 08/05/15 1325    . acetaminophen (TYLENOL) tablet 650 mg  650 mg Oral Q6H PRN [name], MD   650 mg at 07/31/15 6812   Or  . acetaminophen (TYLENOL) suppository 650 mg  650 mg Rectal Q6H PRN [name], MD      . allopurinol (ZYLOPRIM) tablet 300 mg  300 mg Oral Daily [name], MD   300 mg at 08/03/15 1236  . atorvastatin (LIPITOR) tablet 10 mg  10 mg Oral Daily [name], MD   10 mg at 08/03/15 1244  . cholecalciferol (VITAMIN D) tablet 800 Units  800 Units Oral Daily [name], MD    800 Units at 08/03/15 1237  . clonazePAM (KLONOPIN) tablet 1 mg  1 mg Oral QHS [name], MD   1 mg at 08/05/15 2043  . colchicine tablet 0.6 mg  0.6 mg Oral Daily [name], MD   0.6 mg at 08/03/15 1237  . docusate sodium (COLACE) capsule 200 mg  200 mg Oral BID Dustin Flock, MD   200 mg at 08/05/15 2044  . enoxaparin (LOVENOX) injection 40 mg  40 mg Subcutaneous Q24H [name], MD   40 mg at 08/05/15 2042  . feeding supplement (GLUCERNA SHAKE) (GLUCERNA SHAKE) liquid 237 mL  237 mL Oral TID WC [name], MD   [phone]  mL at 08/04/15 1700  . hydrALAZINE (APRESOLINE) tablet 50 mg  50 mg Oral 4 times per day Epifanio Lesches, MD   50 mg at 08/05/15 2044  . insulin aspart (novoLOG) injection 0-9 Units  0-9 Units Subcutaneous TID WC Dustin Flock, MD   2 Units at 08/04/15 1128  . megestrol (MEGACE) 400 MG/10ML suspension 400 mg  400 mg Oral Daily [name], MD   400 mg at 07/30/15 1701  . metoCLOPramide (REGLAN) injection 10 mg  10 mg Intravenous Once PRN Amy Rice, MD      . metoCLOPramide (REGLAN) injection 10 mg  10 mg Intravenous Once Gonzella Lex, MD   10 mg at 08/01/15 1300  . metoprolol succinate (TOPROL-XL) 24 hr tablet 200 mg  200 mg Oral Daily [name], MD   200 mg at 08/03/15 1235  . mirtazapine (REMERON) tablet 45 mg  45 mg Oral QHS Gonzella Lex, MD   45 mg at 08/05/15 2043  . OLANZapine (ZYPREXA) injection 10 mg  10 mg Intramuscular BID AC [name], MD   10 mg at 08/04/15 1151  . ondansetron (ZOFRAN) tablet 4 mg  4 mg Oral Q6H PRN [name], MD       Or  . ondansetron (ZOFRAN) injection 4 mg  4 mg Intravenous Q6H PRN [name], MD      . polyethylene glycol (MIRALAX / GLYCOLAX) packet 17 g  17 g Oral Daily Epifanio Lesches, MD   17 g at 08/04/15 1140  . senna (SENOKOT) tablet 8.6 mg  1 tablet Oral Daily Dustin Flock, MD   8.6 mg at 08/03/15 1237  . traZODone (DESYREL) tablet 25 mg  25 mg Oral TID [name], MD   25 mg  at 08/05/15 2044    Musculoskeletal: Strength & Muscle Tone: decreased Gait & Station: unable to stand Patient leans: N/A  Psychiatric Specialty Exam: Physical Exam  Nursing note and vitals reviewed. Constitutional: He appears well-developed. He appears lethargic. He is uncooperative. He has a sickly appearance.  HENT:  Head: Normocephalic and atraumatic.  Eyes: Conjunctivae are normal. Pupils are equal, round, and reactive to light.  Neck: Normal range of motion.  Cardiovascular: Normal heart sounds.   Respiratory: Effort normal.  GI: Soft.  Musculoskeletal: Normal range of motion.  Neurological: He appears lethargic.  Skin: Skin is warm and dry.  Psychiatric: His affect is blunt. His speech is delayed. He is slowed and withdrawn. Thought content is delusional. Cognition and memory are impaired. He expresses inappropriate judgment. He exhibits a depressed mood. He is noncommunicative.    Review of Systems  Psychiatric/Behavioral: Positive for depression and memory loss. Negative for suicidal ideas, hallucinations and substance abuse. The patient is nervous/anxious.     Blood pressure 152/90, pulse 113, temperature 99.7 F (37.6 C), temperature source Oral, resp. rate 18, height 6' (1.829 m), weight 93.895 kg (207 lb), SpO2 100 %.Body mass index is 28.07 kg/(m^2).  General Appearance: Disheveled  Eye Contact::  Poor  Speech:  Slow  Volume:  Decreased  Mood:  Hopeless  Affect:  Depressed  Thought Process:  Tangential  Orientation:  Negative  Thought Content:  Delusions  Suicidal Thoughts:  Yes.  without intent/plan  Homicidal Thoughts:  No  Memory:  Negative  Judgement:  Poor  Insight:  Lacking  Psychomotor Activity:  Mannerisms  Concentration:  Poor  Recall:  Poor  Fund of Knowledge:Poor  Language: Poor  Akathisia:  No  Handed:  Right  AIMS (if indicated):     Assets:  Financial Resources/Insurance Housing Intimacy Social Support  ADL's:  Impaired  Cognition:  Impaired,  Moderate  Sleep:      Medical Decision Making: Established Problem, Stable/Improving (1), Review of Psycho-Social Stressors (1), Review or order clinical lab tests (1) and Review of Medication Regimen & Side Effects (2)  Treatment Plan Summary: Daily contact with patient to assess and evaluate symptoms and progress in treatment, Medication management and Plan Once again I cannot express too much my appreciation to the hospitalist service for allowing this gentleman to continue to stay on the medical service. We were completely unable to get an IV in him and he desperately needs to continue to get his ECT. The PICC line seems to be working. It also helps to keep him hydrated. Continue current medicine supportive counseling completed. ECT scheduled for tomorrow  Plan:  Supportive therapy provided about ongoing stressors. Disposition: Continue current treatment plan next ECT Wednesday. Please contact psychiatrist on call over the long weekend if needed.  Abdirahim Flavell 08/05/2015 10:28 PM

## 2015-08-06 ENCOUNTER — Inpatient Hospital Stay: Payer: Commercial Managed Care - HMO

## 2015-08-06 LAB — GLUCOSE, CAPILLARY
Glucose-Capillary: 111 mg/dL — ABNORMAL HIGH (ref 65–99)
Glucose-Capillary: 257 mg/dL — ABNORMAL HIGH (ref 65–99)
Glucose-Capillary: 123 mg/dL — ABNORMAL HIGH (ref 65–99)

## 2015-08-06 MED ORDER — HALOPERIDOL LACTATE 5 MG/ML IJ SOLN
2.0000 mg | Freq: Once | INTRAMUSCULAR | Status: AC
  Administered 2015-08-06: 2 mg via INTRAVENOUS
  Filled 2015-08-06: qty 1

## 2015-08-06 MED ORDER — METOPROLOL SUCCINATE ER 100 MG PO TB24
200.0000 mg | ORAL_TABLET | Freq: Every day | ORAL | Status: DC
  Administered 2015-08-06 – 2015-08-27 (×20): 200 mg via ORAL
  Filled 2015-08-06 (×3): qty 2
  Filled 2015-08-06: qty 1
  Filled 2015-08-06 (×17): qty 2

## 2015-08-06 NOTE — Plan of Care (Signed)
Problem: Discharge Progression Outcomes Goal: Discharge plan in place and appropriate Outcome: Not Progressing Plan of care Progress to Goal:  Pt is not close to baseline - so unable to determine d/c placement.  Pt didn't have ECT this am b/c he hadn't been NPO - no order entered.  Pt still has safety sitter - IVC.  Pt not oriented.  No s/sx of discomfort.  Pt on our floor b/c of PICC line b/c of poor IV access.  Pt also having poor PO intake.  Pt will refuse medications but if you take a firm stance, he will take them.

## 2015-08-06 NOTE — Progress Notes (Signed)
Patient ID: Christian [name], male   DOB: [birth year], 64 y.o.   MRN: [id number]  [facility] Physicians PROGRESS NOTE  PCP: Leanor Rubenstein, MD  HPI/Subjective: Patient seen this morning. He ate some breakfast, took pills this morning. Patient was watching TV when I went  to see him.    Objective: Filed Vitals:   08/06/15 1006  BP: 154/89  Pulse: 99  Temp: 97.8 F (36.6 C)  Resp:    ROS: Review of Systems  Unable to perform ROS,does not  Want to talk.\ical Exam  HENT:  Nose: No mucosal edema.  Mouth/Throat: No oropharyngeal exudate or posterior oropharyngeal edema.  Eyes: Conjunctivae, EOM and lids are normal. Pupils are equal, round, and reactive to light.  Neck: No JVD present. Carotid bruit is not present. No edema present. No thyroid mass and no thyromegaly present.  Cardiovascular: S1 normal and S2 normal.   present.  Exam reveals no gallop.   No murmur heard. Pulses:      Dorsalis pedis pulses are 2+ on the right side, and 2+ on the left side.  Respiratory: No respiratory distress. He has no wheezes. He has no rhonchi. He has no rales.  GI: Soft. Bowel sounds are normal. There is no tenderness.  Musculoskeletal:       Right ankle: He exhibits  no swelling       Left ankle: He exhibits no swelling.  Lymphadenopathy:    He has no cervical adenopathy.  Neurological: He is alert. No cranial nerve deficit.  Skin: Skin is warm. No rash noted. Nails show no clubbing.  Large Left medial foot blood blister with skin intact.  Psychiatric: His affect is blunt.    Data Reviewed: Basic Metabolic Panel:  Recent Labs Lab 08/03/15 0507 08/05/15 0521  CREATININE 0.84 0.69   CBC:  Recent Labs Lab 08/03/15 0507  WBC 4.4  HGB 13.4  HCT 40.1  MCV 88.5  PLT 173    Scheduled Meds: . allopurinol  300 mg Oral Daily  . atorvastatin  10 mg Oral Daily  . cholecalciferol  800 Units Oral Daily  . clonazePAM  1 mg Oral QHS  . colchicine  0.6 mg Oral Daily  . docusate  sodium  200 mg Oral BID  . enoxaparin (LOVENOX) injection  40 mg Subcutaneous Q24H  . feeding supplement (GLUCERNA SHAKE)  237 mL Oral TID WC  . hydrALAZINE  50 mg Oral 4 times per day  . insulin aspart  0-9 Units Subcutaneous TID WC  . megestrol  400 mg Oral Daily  . metoCLOPramide (REGLAN) injection  10 mg Intravenous Once  . metoprolol succinate  200 mg Oral Daily  . mirtazapine  45 mg Oral QHS  . OLANZapine  10 mg Intramuscular BID AC  . polyethylene glycol  17 g Oral Daily  . senna  1 tablet Oral Daily  . traZODone  25 mg Oral TID    Assessment/Plan:  1. Severe depression: Continue ECT treatment as per psychiatry.  Also on Klonopin , Zyprexa, Remeron. Had ECT treatmenon 9/2. ECT treatment is not done because he is not then we will. Patient to Future treatment will be on Friday. Patient to be nothing by mouth Monday Wednesday Friday.   2. Dehydration, acute on chronic kidney disease stage III.-Clinically improving, decreased IV fluids. Continue maintenance IV fluids through PICC line. Patient needs PICC line secondary to poor IV access, continued need for ECT treatments. 3. Essential hypertension,- continue metoprolol,Cardizem CD  And hydralazine. Controlled. 4.  Diabetes type 2- sliding-scale , with coverage. Blood glucose overall well controlled. Po intake still not reliable. continue sliding scale with coverage only 5. Hyperlipidemia unspecified- 6. History of gout on allopurinol and colchicine. Code Status:     Code Status Orders        Start     Ordered   07/16/15 1213  Full code   Continuous     07/16/15 1213     Disposition Plan: To be determined  Time spent: 32 minutes minutes  [facility] Hospitalists

## 2015-08-06 NOTE — Care Management (Signed)
Scheduled for ECT this morning. Dr. Toni Amend is unsure as to how many treatments Mr. Burek will need to return to his baseline. Needs PICC line for IV access during ECT treatments. Gwenette Greet RN MSN Care Management [phone]

## 2015-08-06 NOTE — Plan of Care (Signed)
Problem: Discharge Progression Outcomes Goal: Other Discharge Outcomes/Goals Outcome: Progressing Plan of Care Progress to Goal:   Pt wife asked that meds be passed early while she was here to ensure he took them. Pt refuse the med at first but spouse gave pt the meds and he took them, however, pt refuse central line dressing change. Attempts have been made multiple times during shift and pt still refused. Pt is aware of consequences as pt verbalize understanding and did teach back. Pt communicates very little during shift. Pt BP is elevated at the beginning of shift. No other signs of distress noted. Will continue to monitor.

## 2015-08-06 NOTE — OR Nursing (Signed)
Pt ate breakfast at 0700, bacon, eggs and OJ. Case cancelled. NPO note given to sitter to put over the [facility] in pt. Room. NPO after mn Monday, Wednesday and Friday for procedure.

## 2015-08-06 NOTE — Care Management Important Message (Signed)
Important Message  Patient Details  Name: Christian [name] MRN: [id number] Date of Birth: Apr 06, 1951   Medicare Important Message Given:  Yes-second notification given    Olegario Messier A Allmond 08/06/2015, 11:24 AM

## 2015-08-06 NOTE — Progress Notes (Signed)
Nutrition Follow-up  DOCUMENTATION CODES:   Severe malnutrition in context of acute illness/injury  INTERVENTION:   Meals and Snacks: Cater to patient preferences. RD reported to sitter that pt can order breakfast foods for all meal times to encourage po intake.  Medical Food Supplement Therapy: Continue Glucerna as ordered Coordination of Care: obtain new weight as last recorded weight 07/20/2015. Pt in need of stronger bowel regimen as able.   NUTRITION DIAGNOSIS:   Inadequate oral intake related to acute illness as evidenced by meal completion < 25% improving  GOAL:   Patient will meet greater than or equal to 90% of their needs; ongoing  MONITOR:    (Energy Intake, Electrolyte and Renal Profile, Anthropometrics, Skin)   ASSESSMENT:   Pt admitted from behavioral health unit for PICC line placement for ECT treatments. Pt scheduled for ECT this am, however unable to complete as pt was not made NPO after midnight last night.    Diet Order:  Diet Carb Modified Fluid consistency:: Thin; Room service appropriate?: Yes    Current Nutrition: Pt ate eggs, bacon and OJ for breakfast this am. Per sitter, pt wife reports pt eats very well for breakfast. Pt with Glucerna at bedside unopened, per sitter from last night. Limited documentation of po intake. RD happened to see tray 2 days ago after breakfast meal, pt had eaten 80% of french toast and eggs that morning. 50% of dinner eaten last night recorded.   Gastrointestinal Profile: constipation, abdomen soft, nontender Last BM: per chart last BM 07/20/2015   Medications: vitamin D, colace, Novolog, miralax, senokot, NS at 81mL/hr  Electrolyte/Renal Profile and Glucose Profile:   Recent Labs Lab 08/03/15 0507 08/05/15 0521  CREATININE 0.84 0.69   Protein Profile: No results for input(s): ALBUMIN in the last 168 hours.   Weight Trend since Admission: Filed Weights   07/29/15 2004 07/30/15 1205  Weight: 196 lb 1.6 oz (88.95  kg) 207 lb (93.895 kg)    Skin:   (Deep tissure injury to left heel)   BMI:  Body mass index is 28.07 kg/(m^2).  Estimated Nutritional Needs:   Kcal:  2346-2773kcals, BEE: 1778kcals, TEE: (IF 1.1-1.3)(AF 1.2)   Protein:  94-113g protein (1.0-1.2g/kg)   Fluid:  2350-2815mL of fluid (25-31mL/kg)  EDUCATION NEEDS:   Education needs no appropriate at this time   LOW Care Level  Leda Quail, RD, LDN Pager [phone]

## 2015-08-06 NOTE — Progress Notes (Signed)
Pt has refused to have his vitals taken, pt also refuse central line dressing change as well as hydralazine that was due at 6 am.

## 2015-08-06 NOTE — Consult Note (Signed)
[facility] Consult   Reason for Consult:  Follow-up for this 64 year old man with bipolar disorder severe with psychotic depression Referring Physician:  Vianne Bulls Patient Identification: Christian [name] MRN:  [id number] Principal Diagnosis: Bipolar disorder, current episode depressed, severe, with psychotic features Diagnosis:   Patient Active Problem List   Diagnosis Date Noted  . Pressure ulcer [L89.90] 07/30/2015  . Protein-calorie malnutrition, severe [E43] 07/30/2015  . Dehydration [E86.0] 07/29/2015  . Bipolar disorder, current episode depressed, severe, with psychotic features [F31.5] 07/09/2015  . Diabetes [E11.9] 07/09/2015  . UTI (urinary tract infection) [N39.0] 06/19/2014  . Positive blood culture [R78.81] 06/19/2014  . Bipolar affective disorder, current episode manic with psychotic symptoms [F31.2] 06/17/2014  . Type II or unspecified type diabetes mellitus with unspecified complication, uncontrolled [E11.8] 06/02/2014  . Other and unspecified hyperlipidemia [E78.5] 06/02/2014  . OSA on CPAP [G47.33] 06/02/2014  . 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 Gout [M10.9] 06/02/2014  . Hypertension [I10]     Total Time spent with patient: 45 minutes  Subjective:   Christian [name] is a 64 y.o. male patient is still not talking much but he tells me that he is doing okay. He is clearly not doing okay but at least he is not complaining as much as he was before. He still is very negative. HPI: Update as of Wednesday the seventh. Patient continues to be depressed and disorganized in his thinking. Negative affect. Minimal activity. Only eats small amounts on the other hand he is making better eye contact and is eating more and is somewhat more cooperative than he was a few days ago. Patient is still on the medicine service to assist with maintaining his PICC line. He had been scheduled for ECT treatment this morning.  Unfortunately, he was given some food to eat in the morning which necessitated canceling his treatment.  HPI Elements:   Quality:  Severe depression with suicidal-like behavior. Severity:  Severe and life-threatening. Timing:  Lifelong problem worse for a few months. Duration:  Ongoing issue.  Past Medical History:  Past Medical History  Diagnosis Date  . Mood swings   . Type II or unspecified type diabetes mellitus with unspecified complication, uncontrolled 06/02/2014  . Other and unspecified hyperlipidemia 06/02/2014  . OSA on CPAP 06/02/2014  . 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 Bipolar disorder   . CHF (congestive heart failure)   . CKD (chronic kidney disease), stage III   . Diabetes mellitus without complication   . Hypertension   . Gout   . Bipolar 1 disorder   . OSA on CPAP    History reviewed. No pertinent past surgical history. Family History:  Family History  Problem Relation Age of Onset  . Dementia Mother   . Arthritis Father   . Heart failure Neg Hx   . Kidney failure Neg Hx   . Cancer Neg Hx    Social History:  History  Alcohol Use No     History  Drug Use No    Social History   Social History  . Marital Status: Married    Spouse Name: N/A  . Number of Children: N/A  . Years of Education: N/A   Social History Main Topics  . Smoking status: Never Smoker   . Smokeless tobacco: None  . Alcohol Use: No  . Drug Use: No  . Sexual Activity: Not Currently   Other Topics Concern  .  None   Social History Narrative   ** Merged History Encounter **       Additional Social History:                          Allergies:  No Known Allergies  Labs:  Results for orders placed or performed during the hospital encounter of 07/29/15 (from the past 48 hour(s))  Glucose, capillary     Status: Abnormal   Collection Time: 08/04/15  9:02 PM  Result Value Ref Range   Glucose-Capillary 117 (H) 65 - 99 mg/dL    Creatinine, serum     Status: None   Collection Time: 08/05/15  5:21 AM  Result Value Ref Range   Creatinine, Ser 0.69 0.61 - 1.24 mg/dL   GFR calc non Af Amer >60 >60 mL/min   GFR calc Af Amer >60 >60 mL/min    Comment: (NOTE) The eGFR has been calculated using the CKD EPI equation. This calculation has not been validated in all clinical situations. eGFR's persistently <60 mL/min signify possible Chronic Kidney Disease.   Glucose, capillary     Status: Abnormal   Collection Time: 08/05/15  8:30 PM  Result Value Ref Range   Glucose-Capillary 183 (H) 65 - 99 mg/dL   Comment 1 Notify RN   Glucose, capillary     Status: Abnormal   Collection Time: 08/06/15  7:07 AM  Result Value Ref Range   Glucose-Capillary 111 (H) 65 - 99 mg/dL  Glucose, capillary     Status: Abnormal   Collection Time: 08/06/15 11:12 AM  Result Value Ref Range   Glucose-Capillary 257 (H) 65 - 99 mg/dL  Glucose, capillary     Status: Abnormal   Collection Time: 08/06/15  4:19 PM  Result Value Ref Range   Glucose-Capillary 123 (H) 65 - 99 mg/dL    Vitals: Blood pressure 154/89, pulse 99, temperature 97.8 F (36.6 C), temperature source Tympanic, resp. rate 18, height 6' (1.829 m), weight 92.5 kg (203 lb 14.8 oz), SpO2 100 %.  Risk to Self: Is patient at risk for suicide?: Yes Risk to Others:   Prior Inpatient Therapy:   Prior Outpatient Therapy:    Current Facility-Administered Medications  Medication Dose Route Frequency Provider Last Rate Last Dose  . 0.9 %  sodium chloride infusion   Intravenous Continuous Epifanio Lesches, MD 50 mL/hr at 08/05/15 1325    . acetaminophen (TYLENOL) tablet 650 mg  650 mg Oral Q6H PRN [name], MD   650 mg at 07/31/15 1751   Or  . acetaminophen (TYLENOL) suppository 650 mg  650 mg Rectal Q6H PRN [name], MD      . allopurinol (ZYLOPRIM) tablet 300 mg  300 mg Oral Daily [name], MD   300 mg at 08/06/15 0915  . atorvastatin (LIPITOR) tablet 10 mg   10 mg Oral Daily [name], MD   10 mg at 08/06/15 0915  . cholecalciferol (VITAMIN D) tablet 800 Units  800 Units Oral Daily [name], MD   800 Units at 08/06/15 0914  . clonazePAM (KLONOPIN) tablet 1 mg  1 mg Oral QHS [name], MD   1 mg at 08/05/15 2043  . colchicine tablet 0.6 mg  0.6 mg Oral Daily [name], MD   0.6 mg at 08/06/15 0915  . docusate sodium (COLACE) capsule 200 mg  200 mg Oral BID Dustin Flock, MD   200 mg  at 08/06/15 0915  . enoxaparin (LOVENOX) injection 40 mg  40 mg Subcutaneous Q24H [name], MD   40 mg at 08/05/15 2042  . feeding supplement (GLUCERNA SHAKE) (GLUCERNA SHAKE) liquid 237 mL  237 mL Oral TID WC [name], MD   237 mL at 08/04/15 1700  . hydrALAZINE (APRESOLINE) tablet 50 mg  50 mg Oral 4 times per day Epifanio Lesches, MD   50 mg at 08/06/15 1231  . insulin aspart (novoLOG) injection 0-9 Units  0-9 Units Subcutaneous TID WC Dustin Flock, MD   5 Units at 08/06/15 1231  . megestrol (MEGACE) 400 MG/10ML suspension 400 mg  400 mg Oral Daily [name], MD   400 mg at 08/06/15 1017  . metoCLOPramide (REGLAN) injection 10 mg  10 mg Intravenous Once PRN Amy Rice, MD      . metoCLOPramide (REGLAN) injection 10 mg  10 mg Intravenous Once Gonzella Lex, MD   10 mg at 08/01/15 1300  . metoprolol succinate (TOPROL-XL) 24 hr tablet 200 mg  200 mg Oral Daily [name], MD   200 mg at 08/06/15 1013  . mirtazapine (REMERON) tablet 45 mg  45 mg Oral QHS Gonzella Lex, MD   45 mg at 08/05/15 2043  . OLANZapine (ZYPREXA) injection 10 mg  10 mg Intramuscular BID AC [name], MD   10 mg at 08/06/15 0909  . ondansetron (ZOFRAN) tablet 4 mg  4 mg Oral Q6H PRN [name], MD       Or  . ondansetron (ZOFRAN) injection 4 mg  4 mg Intravenous Q6H PRN [name], MD      . polyethylene glycol (MIRALAX / GLYCOLAX) packet 17 g  17 g Oral Daily Epifanio Lesches, MD   17 g at 08/04/15 1140  . senna (SENOKOT)  tablet 8.6 mg  1 tablet Oral Daily Dustin Flock, MD   8.6 mg at 08/06/15 0915  . traZODone (DESYREL) tablet 25 mg  25 mg Oral TID [name], MD   25 mg at 08/06/15 8676    Musculoskeletal: Strength & Muscle Tone: decreased Gait & Station: unable to stand Patient leans: N/A  Psychiatric Specialty Exam: Physical Exam  Nursing note and vitals reviewed. Constitutional: He appears well-developed. He appears lethargic. He is uncooperative. He has a sickly appearance.  HENT:  Head: Normocephalic and atraumatic.  Eyes: Conjunctivae are normal. Pupils are equal, round, and reactive to light.  Neck: Normal range of motion.  Cardiovascular: Normal heart sounds.   Respiratory: Effort normal.  GI: Soft.  Genitourinary:     Musculoskeletal: Normal range of motion.  Neurological: He appears lethargic.  Skin: Skin is warm and dry.  Psychiatric: His affect is blunt. His speech is delayed. He is slowed and withdrawn. Thought content is delusional. Cognition and memory are impaired. He expresses inappropriate judgment. He exhibits a depressed mood. He is noncommunicative.    Review of Systems  Psychiatric/Behavioral: Positive for depression and memory loss. Negative for suicidal ideas, hallucinations and substance abuse. The patient is nervous/anxious.     Blood pressure 154/89, pulse 99, temperature 97.8 F (36.6 C), temperature source Tympanic, resp. rate 18, height 6' (1.829 m), weight 92.5 kg (203 lb 14.8 oz), SpO2 100 %.Body mass index is 27.65 kg/(m^2).  General Appearance: Disheveled  Eye Contact::  Poor  Speech:  Slow  Volume:  Decreased  Mood:  Hopeless  Affect:  Depressed  Thought Process:  Tangential  Orientation:  Negative  Thought Content:  Delusions  Suicidal Thoughts:  Yes.  without intent/plan  Homicidal Thoughts:  No  Memory:  Negative  Judgement:  Poor  Insight:  Lacking  Psychomotor Activity:  Mannerisms  Concentration:  Poor  Recall:  Poor  Fund of  Knowledge:Poor  Language: Poor  Akathisia:  No  Handed:  Right  AIMS (if indicated):     Assets:  Financial Resources/Insurance Housing Intimacy Social Support  ADL's:  Impaired  Cognition: Impaired,  Moderate  Sleep:      Medical Decision Making: Established Problem, Stable/Improving (1), Review of Psycho-Social Stressors (1), Review or order clinical lab tests (1) and Review of Medication Regimen & Side Effects (2)  Treatment Plan Summary: Daily contact with patient to assess and evaluate symptoms and progress in treatment, Medication management and Plan Patient seems to be getting better with ECT treatment. Unfortunately he was given food to eat this morning and therefore he was not able to be treated today. This is unfortunate in that it means he has not had a treatment since last Friday and is lengthening out his stay significantly. We have tried to intervene to make sure that he will be kept nothing by mouth on days of ECT from now on. No other change to medication. Reviewed plan with the patient. Labs and vital signs reviewed.  Plan:  Supportive therapy provided about ongoing stressors. Disposition: Continue current treatment plan next ECT Wednesday. Please contact psychiatrist on call over the long weekend if needed.  Jamarrius Salay 08/06/2015 5:11 PM

## 2015-08-07 LAB — GLUCOSE, CAPILLARY
Glucose-Capillary: 168 mg/dL — ABNORMAL HIGH (ref 65–99)
Glucose-Capillary: 119 mg/dL — ABNORMAL HIGH (ref 65–99)
Glucose-Capillary: 208 mg/dL — ABNORMAL HIGH (ref 65–99)
Glucose-Capillary: 155 mg/dL — ABNORMAL HIGH (ref 65–99)
Glucose-Capillary: 95 mg/dL (ref 65–99)

## 2015-08-07 NOTE — Progress Notes (Signed)
Patient ID: Christian [name], male   DOB: [birth year], 63 y.o.   MRN: [id number]  [facility] PROGRESS NOTE  PCP: Leanor Rubenstein, MD  HPI/Subjective: Seen today.c/o whole body aches.ate good breakfast.took pills also.unclear when he will have next ECT>    Objective: Filed Vitals:   08/07/15 1033  BP: 147/86  Pulse: 106  Temp:   Resp: 20   ROS: Review of Systems  Unable to perform ROS,does not  Want to talk.\ical Exam  HENT:  Nose: No mucosal edema.  Mouth/Throat: No oropharyngeal exudate or posterior oropharyngeal edema.  Eyes: Conjunctivae, EOM and lids are normal. Pupils are equal, round, and reactive to light.  Neck: No JVD present. Carotid bruit is not present. No edema present. No thyroid mass and no thyromegaly present.  Cardiovascular: S1 normal and S2 normal.   present.  Exam reveals no gallop.   No murmur heard. Pulses:      Dorsalis pedis pulses are 2+ on the right side, and 2+ on the left side.  Respiratory: No respiratory distress. He has no wheezes. He has no rhonchi. He has no rales.  GI: Soft. Bowel sounds are normal. There is no tenderness.  Musculoskeletal:       Right ankle: He exhibits  no swelling       Left ankle: He exhibits no swelling.  Lymphadenopathy:    He has no cervical adenopathy.  Neurological: He is alert. No cranial nerve deficit.  Skin: Skin is warm. No rash noted. Nails show no clubbing.  Large Left medial foot blood blister with skin intact.  Psychiatric: His affect is blunt.    Data Reviewed: Basic Metabolic Panel:  Recent Labs Lab 08/03/15 0507 08/05/15 0521  CREATININE 0.84 0.69   CBC:  Recent Labs Lab 08/03/15 0507  WBC 4.4  HGB 13.4  HCT 40.1  MCV 88.5  PLT 173    Scheduled Meds: . allopurinol  300 mg Oral Daily  . atorvastatin  10 mg Oral Daily  . cholecalciferol  800 Units Oral Daily  . clonazePAM  1 mg Oral QHS  . colchicine  0.6 mg Oral Daily  . docusate sodium  200 mg Oral BID  . enoxaparin  (LOVENOX) injection  40 mg Subcutaneous Q24H  . feeding supplement (GLUCERNA SHAKE)  237 mL Oral TID WC  . hydrALAZINE  50 mg Oral 4 times per day  . insulin aspart  0-9 Units Subcutaneous TID WC  . megestrol  400 mg Oral Daily  . metoCLOPramide (REGLAN) injection  10 mg Intravenous Once  . metoprolol succinate  200 mg Oral Daily  . mirtazapine  45 mg Oral QHS  . OLANZapine  10 mg Intramuscular BID AC  . polyethylene glycol  17 g Oral Daily  . senna  1 tablet Oral Daily  . traZODone  25 mg Oral TID    Assessment/Plan:  1. Severe depression: Continue ECT treatment as per psychiatry.  Also on Klonopin , Zyprexa, Remeron. Had ECT treatmenon 9/2. ECT treatment is not done yesterday, Future treatments as per psych. 2. Dehydration, acute on chronic kidney disease stage III.-Clinically improving, decreased IV fluids. Continue maintenance IV fluids through PICC line. Patient needs PICC line secondary to poor IV access, continued need for ECT treatments. 3. Essential hypertension,- continue metoprolol,Cardizem CD  And hydralazine. Controlled. 4. Diabetes type 2- sliding-scale , with coverage. Blood glucose overall well controlled. Po intake still not reliable. continue sliding scale with coverage only 5. Hyperlipidemia unspecified- 6. History of gout on  allopurinol and colchicine. Code Status:     Code Status Orders        Start     Ordered   07/16/15 1213  Full code   Continuous     07/16/15 1213     Disposition Plan: To be determined  Time spent: 32 minutes minutes  [facility]

## 2015-08-07 NOTE — Plan of Care (Signed)
Problem: Discharge Progression Outcomes Goal: Other Discharge Outcomes/Goals Outcome: Progressing Plan of Care Progress to Goal:  Pt was lethargic today - but not talking illogically as he did yesterday.  Pt will be NPO after MN for ECT tx in the am.  Ask specials nurse if they will change the PICC line dressing while he's sedated for procedure - hasn't allowed [place] to do it.  Will take medication, but you must be firm. He's not had a BM since 8/21.  Gave him senna, colace and miralax today.  Continues to have Recruitment consultant for IVC.

## 2015-08-07 NOTE — Consult Note (Signed)
[facility] Face-to-Face Psychiatry Consult   Reason for Consult:  Follow-up for this 64 year old man with bipolar disorder severe with psychotic depression Referring Physician:  Luberta Mutter Patient Identification: Christian [name] MRN:  [id number] Principal Diagnosis: Bipolar disorder, current episode depressed, severe, with psychotic features Diagnosis:   Patient Active Problem List   Diagnosis Date Noted  . Pressure ulcer [L89.90] 07/30/2015  . Protein-calorie malnutrition, severe [E43] 07/30/2015  . Dehydration [E86.0] 07/29/2015  . Bipolar disorder, current episode depressed, severe, with psychotic features [F31.5] 07/09/2015  . Diabetes [E11.9] 07/09/2015  . UTI (urinary tract infection) [N39.0] 06/19/2014  . Positive blood culture [R78.81] 06/19/2014  . Bipolar affective disorder, current episode manic with psychotic symptoms [F31.2] 06/17/2014  . Type II or unspecified type diabetes mellitus with unspecified complication, uncontrolled [E11.8] 06/02/2014  . Other and unspecified hyperlipidemia [E78.5] 06/02/2014  . OSA on CPAP [G47.33] 06/02/2014  . 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 Gout [M10.9] 06/02/2014  . Hypertension [I10]     Total Time spent with patient: 45 minutes  Subjective:   Christian [name] is a 64 y.o. male patient is still not talking much but he tells me that he is doing okay. He is clearly not doing okay but at least he is not complaining as much as he was before. He still is very negative. HPI update as of Wednesday the eighth. Patient has no new complaints. States he is "okay". Says he did need anything at all today but his sitter contradicts this. Patient is not a very reliable historian. He didn't have much more to say. Still clearly very depressed withdrawn and cognitively impaired. Patient is very much in need of continued ECT. I reminded him that we will have ECT tomorrow and at least he did not complain about  it. I hope that he will be compliant but if not he still does not have the capacity to make that decision. Please do not let him eat tomorrow!  HPI Elements:   Quality:  Severe depression with suicidal-like behavior. Severity:  Severe and life-threatening. Timing:  Lifelong problem worse for a few months. Duration:  Ongoing issue.  Past Medical History:  Past Medical History  Diagnosis Date  . Mood swings   . Type II or unspecified type diabetes mellitus with unspecified complication, uncontrolled 06/02/2014  . Other and unspecified hyperlipidemia 06/02/2014  . OSA on CPAP 06/02/2014  . 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 Bipolar disorder   . CHF (congestive heart failure)   . CKD (chronic kidney disease), stage III   . Diabetes mellitus without complication   . Hypertension   . Gout   . Bipolar 1 disorder   . OSA on CPAP    History reviewed. No pertinent past surgical history. Family History:  Family History  Problem Relation Age of Onset  . Dementia Mother   . Arthritis Father   . Heart failure Neg Hx   . Kidney failure Neg Hx   . Cancer Neg Hx    Social History:  History  Alcohol Use No     History  Drug Use No    Social History   Social History  . Marital Status: Married    Spouse Name: N/A  . Number of Children: N/A  . Years of Education: N/A   Social History Main Topics  . Smoking status: Never Smoker   . Smokeless tobacco: None  . Alcohol Use: No  .  Drug Use: No  . Sexual Activity: Not Currently   Other Topics Concern  . None   Social History Narrative   ** Merged History Encounter **       Additional Social History:                          Allergies:  No Known Allergies  Labs:  Results for orders placed or performed during the hospital encounter of 07/29/15 (from the past 48 hour(s))  Glucose, capillary     Status: Abnormal   Collection Time: 08/05/15  8:30 PM  Result Value Ref Range    Glucose-Capillary 183 (H) 65 - 99 mg/dL   Comment 1 Notify RN   Glucose, capillary     Status: Abnormal   Collection Time: 08/06/15  7:07 AM  Result Value Ref Range   Glucose-Capillary 111 (H) 65 - 99 mg/dL  Glucose, capillary     Status: Abnormal   Collection Time: 08/06/15 11:12 AM  Result Value Ref Range   Glucose-Capillary 257 (H) 65 - 99 mg/dL  Glucose, capillary     Status: Abnormal   Collection Time: 08/06/15  4:19 PM  Result Value Ref Range   Glucose-Capillary 123 (H) 65 - 99 mg/dL  Glucose, capillary     Status: Abnormal   Collection Time: 08/07/15  7:54 AM  Result Value Ref Range   Glucose-Capillary 119 (H) 65 - 99 mg/dL  Glucose, capillary     Status: Abnormal   Collection Time: 08/07/15 11:30 AM  Result Value Ref Range   Glucose-Capillary 208 (H) 65 - 99 mg/dL  Glucose, capillary     Status: Abnormal   Collection Time: 08/07/15  4:14 PM  Result Value Ref Range   Glucose-Capillary 155 (H) 65 - 99 mg/dL    Vitals: Blood pressure 140/89, pulse 100, temperature 98.5 F (36.9 C), temperature source Oral, resp. rate 20, height 6' (1.829 m), weight 92.534 kg (204 lb), SpO2 99 %.  Risk to Self: Is patient at risk for suicide?: Yes Risk to Others:   Prior Inpatient Therapy:   Prior Outpatient Therapy:    Current Facility-Administered Medications  Medication Dose Route Frequency Provider Last Rate Last Dose  . 0.9 %  sodium chloride infusion   Intravenous Continuous Katha Hamming, MD 50 mL/hr at 08/07/15 0403    . acetaminophen (TYLENOL) tablet 650 mg  650 mg Oral Q6H PRN Houston Siren, MD   650 mg at 07/31/15 6837   Or  . acetaminophen (TYLENOL) suppository 650 mg  650 mg Rectal Q6H PRN Houston Siren, MD      . allopurinol (ZYLOPRIM) tablet 300 mg  300 mg Oral Daily Houston Siren, MD   300 mg at 08/07/15 1048  . atorvastatin (LIPITOR) tablet 10 mg  10 mg Oral Daily Houston Siren, MD   10 mg at 08/07/15 1049  . cholecalciferol (VITAMIN D) tablet 800 Units   800 Units Oral Daily Houston Siren, MD   800 Units at 08/07/15 1048  . clonazePAM (KLONOPIN) tablet 1 mg  1 mg Oral QHS Houston Siren, MD   1 mg at 08/06/15 2011  . colchicine tablet 0.6 mg  0.6 mg Oral Daily Houston Siren, MD   0.6 mg at 08/07/15 1049  . docusate sodium (COLACE) capsule 200 mg  200 mg Oral BID Auburn Bilberry, MD   200 mg at 08/07/15 1308  . enoxaparin (LOVENOX) injection 40 mg  40 mg Subcutaneous Q24H Houston Siren, MD   40 mg at 08/05/15 2042  . feeding supplement (GLUCERNA SHAKE) (GLUCERNA SHAKE) liquid 237 mL  237 mL Oral TID WC Houston Siren, MD   237 mL at 08/07/15 1310  . hydrALAZINE (APRESOLINE) tablet 50 mg  50 mg Oral 4 times per day Katha Hamming, MD   50 mg at 08/07/15 1308  . insulin aspart (novoLOG) injection 0-9 Units  0-9 Units Subcutaneous TID WC Auburn Bilberry, MD   3 Units at 08/07/15 1309  . megestrol (MEGACE) 400 MG/10ML suspension 400 mg  400 mg Oral Daily Houston Siren, MD   400 mg at 08/07/15 1049  . metoCLOPramide (REGLAN) injection 10 mg  10 mg Intravenous Once PRN Amy Rice, MD      . metoCLOPramide (REGLAN) injection 10 mg  10 mg Intravenous Once Audery Amel, MD   10 mg at 08/01/15 1300  . metoprolol succinate (TOPROL-XL) 24 hr tablet 200 mg  200 mg Oral Daily Houston Siren, MD   200 mg at 08/07/15 1048  . mirtazapine (REMERON) tablet 45 mg  45 mg Oral QHS Audery Amel, MD   45 mg at 08/06/15 2010  . OLANZapine (ZYPREXA) injection 10 mg  10 mg Intramuscular BID AC Houston Siren, MD   10 mg at 08/07/15 1050  . ondansetron (ZOFRAN) tablet 4 mg  4 mg Oral Q6H PRN Houston Siren, MD       Or  . ondansetron (ZOFRAN) injection 4 mg  4 mg Intravenous Q6H PRN Houston Siren, MD      . polyethylene glycol (MIRALAX / GLYCOLAX) packet 17 g  17 g Oral Daily Katha Hamming, MD   17 g at 08/07/15 1049  . senna (SENOKOT) tablet 8.6 mg  1 tablet Oral Daily Auburn Bilberry, MD   8.6 mg at 08/07/15 1049  . traZODone (DESYREL) tablet 25 mg   25 mg Oral TID Houston Siren, MD   25 mg at 08/07/15 1049    Musculoskeletal: Strength & Muscle Tone: decreased Gait & Station: unable to stand Patient leans: N/A  Psychiatric Specialty Exam: Physical Exam  Nursing note and vitals reviewed. Constitutional: He appears well-developed. He appears lethargic. He is uncooperative. He has a sickly appearance.  HENT:  Head: Normocephalic and atraumatic.  Eyes: Conjunctivae are normal. Pupils are equal, round, and reactive to light.  Neck: Normal range of motion.  Cardiovascular: Normal heart sounds.   Respiratory: Effort normal.  GI: Soft.  Genitourinary:     Musculoskeletal: Normal range of motion.  Neurological: He appears lethargic.  Skin: Skin is warm and dry.  Psychiatric: His affect is blunt. His speech is delayed. He is slowed and withdrawn. Thought content is delusional. Cognition and memory are impaired. He expresses inappropriate judgment. He exhibits a depressed mood. He is noncommunicative.    Review of Systems  Psychiatric/Behavioral: Positive for depression and memory loss. Negative for suicidal ideas, hallucinations and substance abuse. The patient is nervous/anxious.     Blood pressure 140/89, pulse 100, temperature 98.5 F (36.9 C), temperature source Oral, resp. rate 20, height 6' (1.829 m), weight 92.534 kg (204 lb), SpO2 99 %.Body mass index is 27.66 kg/(m^2).  General Appearance: Disheveled  Eye Contact::  Poor  Speech:  Slow  Volume:  Decreased  Mood:  Hopeless  Affect:  Depressed  Thought Process:  Tangential  Orientation:  Negative  Thought Content:  Delusions  Suicidal Thoughts:  Yes.  without intent/plan  Homicidal Thoughts:  No  Memory:  Negative  Judgement:  Poor  Insight:  Lacking  Psychomotor Activity:  Mannerisms  Concentration:  Poor  Recall:  Poor  Fund of Knowledge:Poor  Language: Poor  Akathisia:  No  Handed:  Right  AIMS (if indicated):     Assets:  Financial  Resources/Insurance Housing Intimacy Social Support  ADL's:  Impaired  Cognition: Impaired,  Moderate  Sleep:      Medical Decision Making: Established Problem, Stable/Improving (1), Review of Psycho-Social Stressors (1), Review or order clinical lab tests (1) and Review of Medication Regimen & Side Effects (2)  Treatment Plan Summary: Daily contact with patient to assess and evaluate symptoms and progress in treatment, Medication management and Plan Patient is no worse but no better today. Reviewed vital signs reviewed medicine. Plan is for ECT tomorrow morning. Patient absolutely positively needs to be nothing by mouth after midnight tonight. We will transport him to postop tomorrow morning for ECT treatment. No other change to treatment plan. Once again as always I expressed my gratitude to medicine for allowing this patient to stay on the medical service for stabilization.  Plan:  Supportive therapy provided about ongoing stressors. Disposition: Continue current treatment plan next ECT Wednesday. Please contact psychiatrist on call over the long weekend if needed.  Wateen Varon 08/07/2015 6:06 PM

## 2015-08-07 NOTE — Plan of Care (Signed)
Problem: Discharge Progression Outcomes Goal: Other Discharge Outcomes/Goals Outcome: Progressing Plan of care progress to goals: 1. No c/o pain or discomfort 2. Hemodynamically:             -afebrile, BP running high- scheduled Hydralazine given w/ noted decrease              -IVF infusing as ordered              -foley in place for acute urinary retention              -bedtime medication given w/ wife at bedside which helped encourage pt to take             -ECT planned for Friday  3. Tolerating diet well, still poor % of PO intake. Apple juice and graham cracker given for bedtime snack.  4. Independently moves in bed. Bed alarm on, 1:1 sitter at bedside since IVC 5. Pt agreed to take bath and have full linen change tonight

## 2015-08-08 ENCOUNTER — Other Ambulatory Visit: Payer: Self-pay

## 2015-08-08 ENCOUNTER — Inpatient Hospital Stay: Payer: Commercial Managed Care - HMO

## 2015-08-08 ENCOUNTER — Encounter: Payer: Self-pay | Admitting: Anesthesiology

## 2015-08-08 ENCOUNTER — Inpatient Hospital Stay: Payer: Commercial Managed Care - HMO | Admitting: Anesthesiology

## 2015-08-08 LAB — GLUCOSE, CAPILLARY: Glucose-Capillary: 80 mg/dL (ref 65–99)

## 2015-08-08 MED ORDER — LIDOCAINE HCL (CARDIAC) 20 MG/ML IV SOLN
4.0000 mg | Freq: Once | INTRAVENOUS | Status: AC
  Administered 2015-08-08: 4 mg via INTRAVENOUS
  Filled 2015-08-08: qty 0.2

## 2015-08-08 MED ORDER — SUCCINYLCHOLINE CHLORIDE 20 MG/ML IJ SOLN
120.0000 mg | Freq: Once | INTRAMUSCULAR | Status: AC
  Administered 2015-08-08: 120 mg via INTRAVENOUS
  Filled 2015-08-08: qty 6

## 2015-08-08 MED ORDER — LABETALOL HCL 5 MG/ML IV SOLN
20.0000 mg | Freq: Once | INTRAVENOUS | Status: AC
  Administered 2015-08-08: 12:00:00 20 mg via INTRAVENOUS
  Filled 2015-08-08: qty 4

## 2015-08-08 MED ORDER — LABETALOL HCL 5 MG/ML IV SOLN
5.0000 mg | INTRAVENOUS | Status: AC | PRN
  Administered 2015-08-08 (×3): 5 mg via INTRAVENOUS
  Filled 2015-08-08 (×3): qty 4

## 2015-08-08 MED ORDER — SODIUM CHLORIDE 0.9 % IV SOLN: 250.0000 mL | Freq: Once | INTRAVENOUS | Status: DC

## 2015-08-08 MED ORDER — FENTANYL CITRATE (PF) 100 MCG/2ML IJ SOLN: 25.0000 ug | INTRAMUSCULAR | Status: DC | PRN

## 2015-08-08 MED ORDER — KETAMINE HCL 10 MG/ML IJ SOLN
120.0000 mg | Freq: Once | INTRAMUSCULAR | Status: AC
  Administered 2015-08-08: 12:00:00 120 mg via INTRAVENOUS

## 2015-08-08 MED ORDER — ONDANSETRON HCL 4 MG/2ML IJ SOLN: 4.0000 mg | Freq: Once | INTRAMUSCULAR | Status: DC | PRN

## 2015-08-08 NOTE — Procedures (Addendum)
ECT SERVICES Physician's Interval Evaluation & Treatment Note  Patient Identification: Christian [name] MRN:  [id number] Date of Evaluation:  08/08/2015 [place] #: 5  MADRS:   MMSE:   P.E. Findings:  Patient continues to be extremely depressed and flat barely moves not eating well. Psychotic. He expresses the belief that he is dead and everyone around him is dead. Generally uncooperative. No new physical complaints  Psychiatric Interval Note:  See above. Very depressed. Hard to tell if there was any change this week after he missed a treatment. Reportedly had a brief improvement last weekend after his last treatment  Subjective:  Patient is a 64 y.o. male seen for evaluation for Electroconvulsive Therapy. No specific complaints  Treatment Summary:   []   Right Unilateral             [x]  Bilateral   % Energy : 1.0 ms 100%   Impedance: 630 ohms  Seizure Energy Index: 594 V squared  Postictal Suppression Index: 77%  Seizure Concordance Index: 94%  Medications  Pre Shock: Xylocaine 4 mg, labetalol 20 mg, ketamine 120 mg, succinylcholine 120 mg  Post Shock: Labetalol 20 mg  Seizure Duration: 26 seconds by EMG, 26 seconds by EEG   Comments: Plan is to continue ECT into next week Monday Wednesday and Friday. Discussed plan with wife who is still in agreement. Due to psychosis patient still lacks capacity to make reasonable decisions   Lungs:  [x]   Clear to auscultation               []  Other:   Heart:    [x]   Regular rhythm             []  irregular rhythm    [x]   Previous H&P reviewed, patient examined and there are NO CHANGES                 []   Previous H&P reviewed, patient examined and there are changes noted.   [name], MD 9/9/201611:26 AM

## 2015-08-08 NOTE — Anesthesia Preprocedure Evaluation (Signed)
Anesthesia Evaluation  Patient identified by MRN, date of birth, ID band Patient awake    Reviewed: Allergy & Precautions, NPO status , Patient's Chart, lab work & pertinent test results, reviewed documented beta blocker date and time   Airway Mallampati: II  TM Distance: >3 FB     Dental  (+) Chipped   Pulmonary sleep apnea ,           Cardiovascular hypertension, Pt. on medications and Pt. on home beta blockers +CHF       Neuro/Psych PSYCHIATRIC DISORDERS Depression Bipolar Disorder    GI/Hepatic   Endo/Other  diabetes, Type 2  Renal/GU Renal InsufficiencyRenal disease     Musculoskeletal   Abdominal   Peds  Hematology   Anesthesia Other Findings   Reproductive/Obstetrics                             Anesthesia Physical Anesthesia Plan  ASA: III  Anesthesia Plan: General   Post-op Pain Management:    Induction: Intravenous  Airway Management Planned: Mask  Additional Equipment:   Intra-op Plan:   Post-operative Plan:   Informed Consent: I have reviewed the patients History and Physical, chart, labs and discussed the procedure including the risks, benefits and alternatives for the proposed anesthesia with the patient or authorized representative who has indicated his/her understanding and acceptance.     Plan Discussed with: CRNA  Anesthesia Plan Comments:         Anesthesia Quick Evaluation

## 2015-08-08 NOTE — Transfer of Care (Signed)
Immediate Anesthesia Transfer of Care Note  Patient: Christian [name]  Procedure(s) Performed: ECT  Patient Location: PACU  Anesthesia Type:General  Level of Consciousness: sedated  Airway & Oxygen Therapy: Patient Spontanous Breathing and Patient connected to face mask oxygen  Post-op Assessment: Report given to RN  Post vital signs: Reviewed and stable  Last Vitals:  Filed Vitals:   08/08/15 1151  BP: 175/111  Pulse: 97  Temp: 37.1 C  Resp: 20    Complications: No apparent anesthesia complications

## 2015-08-08 NOTE — Plan of Care (Signed)
Problem: Discharge Progression Outcomes Goal: Other Discharge Outcomes/Goals Plan of care progress to goal - No complaints of pain. - Continues on IV fluids. - Sitter at bedside. - Scheduled for an ECT today.

## 2015-08-08 NOTE — Progress Notes (Signed)
Patient ID: Christian [name], male   DOB: Jan 02, 1951, 64 y.o.   MRN: [id number]  [facility] PROGRESS NOTE  PCP: Leanor Rubenstein, MD  HPI/Subjective: Getting ECT today Objective: Filed Vitals:   08/08/15 0936  BP: 171/91  Pulse: 92  Temp: 96.2 F (35.7 C)  Resp: 18   ROS: Review of Systems  Unable to perform ROS,he is in [employer] Exam  HENT:  Nose: No mucosal edema.  Mouth/Throat: No oropharyngeal exudate or posterior oropharyngeal edema.  Eyes: Conjunctivae, EOM and lids are normal. Pupils are equal, round, and reactive to light.  Neck: No JVD present. Carotid bruit is not present. No edema present. No thyroid mass and no thyromegaly present.  Cardiovascular: S1 normal and S2 normal.   present.  Exam reveals no gallop.   No murmur heard. Pulses:      Dorsalis pedis pulses are 2+ on the right side, and 2+ on the left side.  Respiratory: No respiratory distress. He has no wheezes. He has no rhonchi. He has no rales.  GI: Soft. Bowel sounds are normal. There is no tenderness.  Musculoskeletal:       Right ankle: He exhibits  no swelling       Left ankle: He exhibits no swelling.  Lymphadenopathy:    He has no cervical adenopathy.  Neurological: He is alert. No cranial nerve deficit.  Skin: Skin is warm. No rash noted. Nails show no clubbing.  Large Left medial foot blood blister with skin intact.  Psychiatric: His affect is blunt.    Data Reviewed: Basic Metabolic Panel:  Recent Labs Lab 08/03/15 0507 08/05/15 0521  CREATININE 0.84 0.69   CBC:  Recent Labs Lab 08/03/15 0507  WBC 4.4  HGB 13.4  HCT 40.1  MCV 88.5  PLT 173    Scheduled Meds: . sodium chloride  250 mL Intravenous Once  . allopurinol  300 mg Oral Daily  . atorvastatin  10 mg Oral Daily  . cholecalciferol  800 Units Oral Daily  . clonazePAM  1 mg Oral QHS  . colchicine  0.6 mg Oral Daily  . docusate sodium  200 mg Oral BID  . enoxaparin (LOVENOX) injection  40 mg  Subcutaneous Q24H  . feeding supplement (GLUCERNA SHAKE)  237 mL Oral TID WC  . hydrALAZINE  50 mg Oral 4 times per day  . insulin aspart  0-9 Units Subcutaneous TID WC  . ketamine  120 mg Intravenous Once  . labetalol  20 mg Intravenous Once  . lidocaine (cardiac) 100 mg/32ml  4 mg Intravenous Once  . megestrol  400 mg Oral Daily  . metoCLOPramide (REGLAN) injection  10 mg Intravenous Once  . metoprolol succinate  200 mg Oral Daily  . mirtazapine  45 mg Oral QHS  . OLANZapine  10 mg Intramuscular BID AC  . polyethylene glycol  17 g Oral Daily  . senna  1 tablet Oral Daily  . succinylcholine  120 mg Intravenous Once  . traZODone  25 mg Oral TID    Assessment/Plan:  1. Severe depression: Continue ECT treatment as per psychiatry.  Also on Klonopin , Zyprexa, Remeron. Had ECT treatmenon 9/2. ECT treatment is not done yesterday, Future treatments as per psych. 2. Dehydration, acute on chronic kidney disease stage III.-Clinically improving, decreased IV fluids. Continue maintenance IV fluids through PICC line. Patient needs PICC line secondary to poor IV access, continued need for ECT treatments. 3. Essential hypertension,- continue metoprolol,Cardizem CD  And hydralazine. Controlled.  4. Diabetes type 2- sliding-scale , with coverage. Blood glucose overall well controlled. Po intake still not reliable. continue sliding scale with coverage only 5. Hyperlipidemia unspecified- 6. History of gout on allopurinol and colchicine. Code Status:     Code Status Orders        Start     Ordered   07/16/15 1213  Full code   Continuous     07/16/15 1213     Disposition Plan: To be determined  Time spent: 32 minutes minutes  [facility]

## 2015-08-08 NOTE — Progress Notes (Signed)
Pt returned to unit / room 122  Post ect.  No resp distress.   ivc sitter in room.  Negative responses when spoken to. Foley intact

## 2015-08-08 NOTE — Anesthesia Procedure Notes (Signed)
Date/Time: 08/08/2015 11:37 AM Performed by: Lily Kocher Pre-anesthesia Checklist: Patient identified, Emergency Drugs available, Suction available and Patient being monitored Patient Re-evaluated:Patient Re-evaluated prior to inductionOxygen Delivery Method: Circle system utilized Preoxygenation: Pre-oxygenation with 100% oxygen Intubation Type: IV induction Ventilation: Mask ventilation without difficulty and Mask ventilation throughout procedure Airway Equipment and Method: Bite block Placement Confirmation: positive ETCO2 Dental Injury: Teeth and Oropharynx as per pre-operative assessment

## 2015-08-08 NOTE — Care Management Important Message (Signed)
Important Message  Patient Details  Name: Christian [name] MRN: [id number] Date of Birth: [birth year]   Medicare Important Message Given:   (5th IM given)    Gwenette Greet, RN 08/08/2015, 9:54 AM

## 2015-08-09 LAB — CREATININE, SERUM
Creatinine, Ser: 0.67 mg/dL (ref 0.61–1.24)
GFR calc Af Amer: >60 mL/min (ref >60)
GFR calc non Af Amer: >60 mL/min (ref >60)

## 2015-08-09 LAB — GLUCOSE, CAPILLARY
Glucose-Capillary: 127 mg/dL — ABNORMAL HIGH (ref 65–99)
Glucose-Capillary: 114 mg/dL — ABNORMAL HIGH (ref 65–99)
Glucose-Capillary: 256 mg/dL — ABNORMAL HIGH (ref 65–99)

## 2015-08-09 NOTE — Plan of Care (Signed)
Problem: Discharge Progression Outcomes Goal: Other Discharge Outcomes/Goals Outcome: Progressing Plan of care progress to goal: Pt had ECT yesterday. Refusing some medicines throughout the day. Took afternoon meds though. MD aware Tolerating diet, ate well today. Foley and PICC remain. IVC and sitter remain. No c/o pain. No visitors all day.

## 2015-08-09 NOTE — Plan of Care (Signed)
Problem: Discharge Progression Outcomes Goal: Other Discharge Outcomes/Goals Outcome: Progressing Plan of care progress to goals: Discharge plan in place- pt continues to be admitted IVC with 1:1 safety sitter, continue to monitor and update discharge plan.  Hemodynamically- afebrile, BP continues to be elevated due to pt refusing medications, IVF infusing per order, foley continues to drain yellow urine and in place for acute urinary retention, pt refusing all medications, NPO after midnight for ECT on Saturday.   Tolerating diet - Tolerating diet well, no c/o nausea or vomiting,  poor PO intake.    Activity appropriate for discharge-  Independently moves in bed. Bed alarm on, 1:1 sitter at bedside since IVC  A&O patient, denies pain or shortness of breath. Refusing all medications this shift. 1:1 safety sitter at bedside, pt continues to be IVC. IVF infusing per order. Foley catheter continues to drain yellow urine. High fall risk, bed alarm activated.

## 2015-08-09 NOTE — Progress Notes (Signed)
Patient ID: Christian [name], male   DOB: 1951/04/25, 64 y.o.   MRN: [id number]  [facility] Physicians PROGRESS NOTE  PCP: Leanor Rubenstein, MD  HPI/Subjective: Is post ECT yesterday. Patient is still eating well.  Objective: Filed Vitals:   08/09/15 0450  BP: 168/94  Pulse: 86  Temp: 98.9 F (37.2 C)  Resp: 18   ROS: Review of Systems  Unable to perform ROS,he is in [employer] Exam  HENT:  Nose: No mucosal edema.  Mouth/Throat: No oropharyngeal exudate or posterior oropharyngeal edema.  Eyes: Conjunctivae, EOM and lids are normal. Pupils are equal, round, and reactive to light.  Neck: No JVD present. Carotid bruit is not present. No edema present. No thyroid mass and no thyromegaly present.  Cardiovascular: S1 normal and S2 normal.   present.  Exam reveals no gallop.   No murmur heard. Pulses:      Dorsalis pedis pulses are 2+ on the right side, and 2+ on the left side.  Respiratory: No respiratory distress. He has no wheezes. He has no rhonchi. He has no rales.  GI: Soft. Bowel sounds are normal. There is no tenderness.  Musculoskeletal:       Right ankle: He exhibits  no swelling       Left ankle: He exhibits no swelling.  Lymphadenopathy:    He has no cervical adenopathy.  Neurological: He is alert. No cranial nerve deficit.  Skin: Skin is warm. No rash noted. Nails show no clubbing.  Large Left medial foot blood blister with skin intact.  Psychiatric: His affect is blunt.    Data Reviewed: Basic Metabolic Panel:  Recent Labs Lab 08/03/15 0507 08/05/15 0521 08/09/15 0623  CREATININE 0.84 0.69 0.67   CBC:  Recent Labs Lab 08/03/15 0507  WBC 4.4  HGB 13.4  HCT 40.1  MCV 88.5  PLT 173    Scheduled Meds: . sodium chloride  250 mL Intravenous Once  . allopurinol  300 mg Oral Daily  . atorvastatin  10 mg Oral Daily  . cholecalciferol  800 Units Oral Daily  . clonazePAM  1 mg Oral QHS  . colchicine  0.6 mg Oral Daily  . docusate sodium  200 mg  Oral BID  . enoxaparin (LOVENOX) injection  40 mg Subcutaneous Q24H  . feeding supplement (GLUCERNA SHAKE)  237 mL Oral TID WC  . hydrALAZINE  50 mg Oral 4 times per day  . insulin aspart  0-9 Units Subcutaneous TID WC  . megestrol  400 mg Oral Daily  . metoCLOPramide (REGLAN) injection  10 mg Intravenous Once  . metoprolol succinate  200 mg Oral Daily  . mirtazapine  45 mg Oral QHS  . OLANZapine  10 mg Intramuscular BID AC  . polyethylene glycol  17 g Oral Daily  . senna  1 tablet Oral Daily  . traZODone  25 mg Oral TID    Assessment/Plan:  1. Severe depression: Continue ECT treatment as per psychiatry.  Also on Klonopin , Zyprexa, Remeron. ECT treatments Monday, Wednesday, Friday.  2. Dehydration, acute on chronic kidney disease stage III.-Clinically improving, decreased IV fluids. Continue maintenance IV fluids through PICC line. Patient needs PICC line secondary to poor IV access, continued need for ECT treatments. 3. Essential hypertension,- continue metoprolol,Cardizem CD  And hydralazine. High at times. 4. Diabetes type 2- sliding-scale , with coverage. Blood glucose overall well controlled. Po intake still not reliable. continue sliding scale with coverage only 5. Hyperlipidemia unspecified- 6. History of gout on  allopurinol and colchicine. Code Status:     Code Status Orders        Start     Ordered   07/16/15 1213  Full code   Continuous     07/16/15 1213     Disposition Plan: To be determined  Time spent: 32 minutes minutes  [facility] Hospitalists

## 2015-08-10 LAB — GLUCOSE, CAPILLARY
Glucose-Capillary: 146 mg/dL — ABNORMAL HIGH (ref 65–99)
Glucose-Capillary: 188 mg/dL — ABNORMAL HIGH (ref 65–99)
Glucose-Capillary: 181 mg/dL — ABNORMAL HIGH (ref 65–99)

## 2015-08-10 NOTE — Progress Notes (Signed)
Pt A&O x 1; remains on High Fall Risks; VSS; sitter at bedside; pt took all of this scheduled medications this shift; pt PO intake at meals fed by sitter

## 2015-08-10 NOTE — Plan of Care (Signed)
Problem: Discharge Progression Outcomes Goal: Other Discharge Outcomes/Goals Outcome: Progressing Plan of Care Progress to Goal:   Pt has refused all his meds and allowed partial physical assessment. No signs of distress was noted. Will continue to monitor.

## 2015-08-10 NOTE — Progress Notes (Signed)
Patient ID: Christian [name], male   DOB: [birth year], 64 y.o.   MRN: [id number]  [facility] Physicians PROGRESS NOTE  PCP: Leanor Rubenstein, MD  HPI/Subjective: Patient is here from psych unit secondary to severe depression requiring IV via PICC line for ECT treatments. Patient is having multiple ECT treatments but looks likely that are not helping him. Patient is still depressed not eating well and refusing the medications also on and off. IObjective: Filed Vitals:   08/10/15 0656  BP: 178/97  Pulse: 102  Temp: 98.7 F (37.1 C)  Resp: 20   ROS: Review of Systems  Unable to perform ROS, not cooperative. Refused to talk.  PHYSICAL EXAM; HENT' atraumatic, normocephalic. ENT is within normal limits. Nose: No mucosal edema.  Mouth/Throat: No oropharyngeal exudate or posterior oropharyngeal edema.  Eyes: Conjunctivae, EOM and lids are normal. Pupils are equal, round, and reactive to light.  Neck: No JVD present. Carotid bruit is not present. No edema present. No thyroid mass and no thyromegaly present.  Cardiovascular: S1 normal and S2 normal.   present.  Exam reveals no gallop.   No murmur heard. Pulses:      Dorsalis pedis pulses are 2+ on the right side, and 2+ on the left side.  Respiratory: No respiratory distress. He has no wheezes. He has no rhonchi. He has no rales.  GI: Soft. Bowel sounds are normal. There is no tenderness.  Musculoskeletal:       Right ankle: He exhibits  no swelling       Left ankle: He exhibits no swelling.  Lymphadenopathy:    He has no cervical adenopathy.  Neurological: He is alert. No cranial nerve deficit.  Skin: Skin is warm. No rash noted. Nails show no clubbing.  Large Left medial foot blood blister with skin intact.  Psychiatric: His affect is blunt.    Data Reviewed: Basic Metabolic Panel:  Recent Labs Lab 08/05/15 0521 08/09/15 0623  CREATININE 0.69 0.67   CBC: No results for input(s): WBC, NEUTROABS, HGB, HCT, MCV, PLT in the  last 168 hours.  Scheduled Meds: . sodium chloride  250 mL Intravenous Once  . allopurinol  300 mg Oral Daily  . atorvastatin  10 mg Oral Daily  . cholecalciferol  800 Units Oral Daily  . clonazePAM  1 mg Oral QHS  . colchicine  0.6 mg Oral Daily  . docusate sodium  200 mg Oral BID  . enoxaparin (LOVENOX) injection  40 mg Subcutaneous Q24H  . feeding supplement (GLUCERNA SHAKE)  237 mL Oral TID WC  . hydrALAZINE  50 mg Oral 4 times per day  . insulin aspart  0-9 Units Subcutaneous TID WC  . megestrol  400 mg Oral Daily  . metoCLOPramide (REGLAN) injection  10 mg Intravenous Once  . metoprolol succinate  200 mg Oral Daily  . mirtazapine  45 mg Oral QHS  . OLANZapine  10 mg Intramuscular BID AC  . polyethylene glycol  17 g Oral Daily  . senna  1 tablet Oral Daily  . traZODone  25 mg Oral TID    Assessment/Plan:  1. Severe depression: Continue ECT treatment as per psychiatry.  Also on Klonopin , Zyprexa, Remeron. ECT treatments Monday, Wednesday, Friday.  , depressed affect.  Refusing  to eat also at times. 2. Dehydration, acute on chronic kidney disease stage III.-Clinically improving, decreased IV fluids. Continue maintenance IV fluids through PICC line. Patient needs PICC line secondary to poor IV access, continued need for ECT treatments.  3. Essential hypertension,- continue metoprolol,Cardizem CD  And hydralazine. High at times. 4. Diabetes type 2- sliding-scale , with coverage. Blood glucose overall well controlled. Po intake still not reliable. continue sliding scale with coverage only 5. Hyperlipidemia unspecified- 6. History of gout on allopurinol and colchicine. Code Status:     Code Status Orders        Start     Ordered   07/16/15 1213  Full code   Continuous     07/16/15 1213     Disposition Plan: To be determined  Time spent: 32 minutes minutes  [facility] Hospitalists

## 2015-08-11 ENCOUNTER — Inpatient Hospital Stay: Payer: Commercial Managed Care - HMO | Admitting: Anesthesiology

## 2015-08-11 ENCOUNTER — Inpatient Hospital Stay: Payer: Commercial Managed Care - HMO

## 2015-08-11 ENCOUNTER — Encounter: Payer: Self-pay | Admitting: Anesthesiology

## 2015-08-11 ENCOUNTER — Other Ambulatory Visit: Payer: Self-pay

## 2015-08-11 LAB — GLUCOSE, CAPILLARY
Glucose-Capillary: 119 mg/dL — ABNORMAL HIGH (ref 65–99)
Glucose-Capillary: 177 mg/dL — ABNORMAL HIGH (ref 65–99)
Glucose-Capillary: 143 mg/dL — ABNORMAL HIGH (ref 65–99)
Glucose-Capillary: 275 mg/dL — ABNORMAL HIGH (ref 65–99)

## 2015-08-11 MED ORDER — LABETALOL HCL 5 MG/ML IV SOLN
10.0000 mg | Freq: Once | INTRAVENOUS | Status: AC
  Administered 2015-08-11: 10 mg via INTRAVENOUS
  Filled 2015-08-11: qty 4

## 2015-08-11 MED ORDER — NITROGLYCERIN 0.2 MG/ML ON CALL CATH LAB
100.0000 ug | Freq: Once | INTRAVENOUS | Status: AC
  Administered 2015-08-11: 12:00:00 100 ug via INTRAVENOUS

## 2015-08-11 MED ORDER — SUCCINYLCHOLINE CHLORIDE 20 MG/ML IJ SOLN
120.0000 mg | Freq: Once | INTRAMUSCULAR | Status: AC
  Administered 2015-08-11: 12:00:00 120 mg via INTRAVENOUS
  Filled 2015-08-11: qty 6

## 2015-08-11 MED ORDER — ONDANSETRON HCL 4 MG/2ML IJ SOLN: 4.0000 mg | Freq: Once | INTRAMUSCULAR | Status: DC | PRN

## 2015-08-11 MED ORDER — FENTANYL CITRATE (PF) 100 MCG/2ML IJ SOLN: 25.0000 ug | INTRAMUSCULAR | Status: DC | PRN

## 2015-08-11 MED ORDER — SODIUM CHLORIDE 0.9 % IV SOLN
250.0000 mL | Freq: Once | INTRAVENOUS | Status: AC
  Administered 2015-08-13: 12:00:00 via INTRAVENOUS

## 2015-08-11 MED ORDER — LABETALOL HCL 5 MG/ML IV SOLN
20.0000 mg | Freq: Once | INTRAVENOUS | Status: AC
  Administered 2015-08-11: 12:00:00 20 mg via INTRAVENOUS

## 2015-08-11 MED ORDER — LABETALOL HCL 5 MG/ML IV SOLN
20.0000 mg | Freq: Once | INTRAVENOUS | Status: AC
  Administered 2015-08-11: 20 mg via INTRAVENOUS
  Filled 2015-08-11: qty 4

## 2015-08-11 MED ORDER — LIDOCAINE HCL (CARDIAC) 20 MG/ML IV SOLN
4.0000 mg | Freq: Once | INTRAVENOUS | Status: AC
  Administered 2015-08-11: 12:00:00 4 mg via INTRAVENOUS
  Filled 2015-08-11: qty 0.2

## 2015-08-11 MED ORDER — KETAMINE HCL 10 MG/ML IJ SOLN
120.0000 mg | Freq: Once | INTRAMUSCULAR | Status: AC
  Administered 2015-08-11: 120 mg via INTRAVENOUS

## 2015-08-11 NOTE — Progress Notes (Signed)
IM placed at bedside

## 2015-08-11 NOTE — Plan of Care (Signed)
Problem: Discharge Progression Outcomes Goal: Other Discharge Outcomes/Goals Outcome: Progressing Plan of care progress to goals: Discharge plan in place- pt continues to be admitted IVC with 1:1 safety sitter, continue to monitor and update discharge plan.   Hemodynamically- afebrile, BP continues to be elevated, BP meds given,  IVF infusing per order, foley continues to drain yellow urine and in place for acute urinary retention,  NPO after midnight for ECT on Monday.    Tolerating diet - Tolerating diet well, no c/o nausea or vomiting,  poor PO intake.     Activity appropriate for discharge-  Independently moves in bed. Bed alarm on, 1:1 sitter at bedside since IVC

## 2015-08-11 NOTE — Evaluation (Addendum)
Physical Therapy Evaluation Patient Details Name: Christian [name] MRN: [id number] DOB: [birth year] Today's Date: 08/11/2015   History of Present Illness  Pt is a 64 year old male with bipolar disorder with recent suicide attempt and agitation and threats to wife. Pt transfered to this unit for IV access while he recieives ECT treatment.   Clinical Impression  Upon evaluation, pt able to follow simple commands regarding AROM of limbs but was unwilling to cooperate with basic questions regarding living situation and resisted activity that required him to sit up from supine. Pt will be taken on as a trial patient for PT due to his need for exercise in order to increase his general strength, ROM, and functional mobility. If patient continues to resist PT services he will be d/c.     Follow Up Recommendations LTACH    Equipment Recommendations       Recommendations for Other Services       Precautions / Restrictions Precautions Precautions: Fall Restrictions Weight Bearing Restrictions: No      Mobility  Bed Mobility Overal bed mobility: +2 for physical assistance             General bed mobility comments: Pt reluctant to perform any bed mobility tasks  Transfers Overall transfer level:  (unable to assess)                  Ambulation/Gait Ambulation/Gait assistance:  (no ambulation performed today)              Stairs            Wheelchair Mobility    Modified Rankin (Stroke Patients Only)       Balance Overall balance assessment:  (balance not assessed)                                           Pertinent Vitals/Pain Pain Assessment:  (pt states that he hurts everywhere)    Home Living Family/patient expects to be discharged to::  (pt was unable to report) Living Arrangements:  (pt unable to comment)             Home Equipment:  (pt unable to comment)      Prior Function Level of Independence:  (pt states that he  is able to move around when he is at home but pt was unable to provide details of level of independence)               Hand Dominance        Extremity/Trunk Assessment   Upper Extremity Assessment: RUE deficits/detail;LUE deficits/detail RUE Deficits / Details: ROM deficits due to weakness and suspected contractures/tightness, unable to fully assess strength and sensation due to lack of cooperation with evaluation     LUE Deficits / Details: ROM deficits due to weakness and suspected contractures/tightness, unable to fully assess strength and sensation due to lack of cooperation with evaluation   Lower Extremity Assessment: RLE deficits/detail;LLE deficits/detail RLE Deficits / Details: weakness and ROM deficitis due to immobility, unable to fully assess strength and sensation due to lack of cooperation with evaluation LLE Deficits / Details: weakness and ROM deficitis due to immobility, unable to fully assess strength and sensation due to lack of cooperation with evaluation     Communication   Communication: Expressive difficulties;Other (comment) (pt mumbles and would frequently ignore questions )  Cognition Arousal/Alertness: Lethargic  Behavior During Therapy: Agitated;Flat affect (pt was cooperative with simple tasks (e.g. attempting to lift arm/leg) but was very resistant to any activities regarding sitting up from supine. ) Overall Cognitive Status: History of cognitive impairments - at baseline                      General Comments      Exercises General Exercises - Upper Extremity Shoulder Flexion: AROM;PROM (MMT grade 3-/5 assessd via AROM) Elbow Flexion:  (MMT grade: 3-/5 assessed via MMT) Elbow Extension:  (MMT grade 3-/5 assessed via AROM) General Exercises - Lower Extremity Ankle Circles/Pumps: AROM Heel Slides: AROM (MMT 2/5 at least assessed via MMT) Hip ABduction/ADduction: AROM (2/5 MMT assessed via AROM) Straight Leg Raises: AROM (supine; hip  flexion 3-/5 assessed via AROM) Hip Flexion/Marching: AROM (supine; hip flexion 3-/5 assessed via AROM) Hand Exercises Forearm Supination: AROM (WNL) Forearm Pronation: AROM (WNL)      Assessment/Plan    PT Assessment Patient needs continued PT services  PT Diagnosis Generalized weakness;Altered mental status   PT Problem List Decreased activity tolerance;Decreased cognition;Decreased strength;Decreased range of motion;Impaired tone  PT Treatment Interventions Therapeutic activities;Functional mobility training;Therapeutic exercise;Patient/family education;Manual techniques   PT Goals (Current goals can be found in the Care Plan section) Acute Rehab PT Goals Patient Stated Goal:  (pt did unable to state) Additional Goals Additional Goal #1: Assess and establish goals for OOB activities as appropriate and patient allows.    Frequency Min 2X/week   Barriers to discharge  (pt unable to perform basic mobility tasks)      Co-evaluation               End of Session   Activity Tolerance: Patient limited by lethargy (pt resistant to answering most questions and reluctant to participate in therapy outside of supine AROM/PROM) Patient left: in bed;with nursing/sitter in room;with bed alarm set           Time: 1346-1400 PT Time Calculation (min) (ACUTE ONLY): 14 min   Charges:         PT G Codes:        Isaiah Blakes, SPT 08/11/2015, 4:22 PM

## 2015-08-11 NOTE — Plan of Care (Signed)
Problem: Discharge Progression Outcomes Goal: Other Discharge Outcomes/Goals Outcome: Progressing Plan of care progress to goal for: 1. Pain-no c/o pain this shift 2. Hemodynamically-             -VSS, pt remains afebrile this shift             -IV fluids continue per orders 3. Complications-pt's BP was high this morning prior to ECT, pt received IV meds for BP with in treatment and BP is much improved 4. Diet-pt has poor po intake this shift 5. Activity-pt was evaluated by PT and remains on bedrest at this time

## 2015-08-11 NOTE — Progress Notes (Signed)
Report called to Floor nurse from PACU

## 2015-08-11 NOTE — Clinical Social Work Placement (Signed)
   CLINICAL SOCIAL WORK PLACEMENT  NOTE  Date:  08/11/2015  Patient Details  Name: Christian [name] MRN: [id number] Date of Birth: January 26, 1951  Clinical Social Work is seeking post-discharge placement for this patient at the Skilled  Nursing Facility level of care (*CSW will initial, date and re-position this form in  chart as items are completed):  Yes   Patient/family provided with [facility] Clinical Social Work Department's list of facilities offering this level of care within the geographic area requested by the patient (or if unable, by the patient's family).  Yes   Patient/family informed of their freedom to choose among providers that offer the needed level of care, that participate in Medicare, Medicaid or managed care program needed by the patient, have an available bed and are willing to accept the patient.  Yes   Patient/family informed of [facility]'s ownership interest in [facility] and [facility], as well as of the fact that they are under no obligation to receive care at these facilities.  PASRR submitted to EDS on 08/11/15     PASRR number received on       Existing PASRR number confirmed on       FL2 transmitted to all facilities in geographic area requested by pt/family on 08/11/15     FL2 transmitted to all facilities within larger geographic area on       Patient informed that his/her managed care company has contracts with or will negotiate with certain facilities, including the following:            Patient/family informed of bed offers received.  Patient chooses bed at       Physician recommends and patient chooses bed at      Patient to be transferred to   on  .  Patient to be transferred to facility by       Patient family notified on   of transfer.  Name of family member notified:        PHYSICIAN       Additional Comment:    _______________________________________________ Haig Prophet, LCSW 08/11/2015, 6:16 PM

## 2015-08-11 NOTE — Anesthesia Postprocedure Evaluation (Signed)
  Anesthesia Post-op Note  Patient: Christian [name]  Procedure(s) Performed: * No procedures listed *  Anesthesia type:General  Patient location: PACU  Post pain: Pain level controlled  Post assessment: Post-op Vital signs reviewed, Patient's Cardiovascular Status Stable, Respiratory Function Stable, Patent Airway and No signs of Nausea or vomiting  Post vital signs: Reviewed and stable  Last Vitals:  Filed Vitals:   08/11/15 0636  BP: 161/102  Pulse: 97  Temp: 36.8 C  Resp: 18    Level of consciousness: awake, alert  and patient cooperative  Complications: No apparent anesthesia complications

## 2015-08-11 NOTE — Anesthesia Postprocedure Evaluation (Signed)
  Anesthesia Post-op Note  Patient: Christian [name]  Procedure(s) Performed: * No procedures listed *  Anesthesia type:General  Patient location: PACU  Post pain: Pain level controlled  Post assessment: Post-op Vital signs reviewed, Patient's Cardiovascular Status Stable, Respiratory Function Stable, Patent Airway and No signs of Nausea or vomiting  Post vital signs: Reviewed and stable  Last Vitals:  Filed Vitals:   08/11/15 1256  BP:   Pulse: 95  Temp: 37.7 C  Resp: 18    Level of consciousness: awake, alert  and patient cooperative  Complications: No apparent anesthesia complications

## 2015-08-11 NOTE — Clinical Social Work Note (Signed)
Clinical Social Work Assessment  Patient Details  Name: Christian [name] MRN: [id number] Date of Birth: 1951/09/16  Date of referral:  08/11/15               Reason for consult:  Facility Placement                Permission sought to share information with:  Chartered certified accountant granted to share information::     Name::      Christian::   [name]   Relationship::     Contact Information:     Housing/Transportation Living arrangements for the past 2 months:  Single Family Home Source of Information:  Spouse Patient Interpreter Needed:  None Criminal Activity/Legal Involvement Pertinent to Current Situation/Hospitalization:  No - Comment as needed Significant Relationships:  Spouse, Adult Children Lives with:  Spouse Do you feel safe going back to the place where you live?  Yes Need for family participation in patient care:     Care giving concerns: Patient lives with his wife Christian [name] in [place].    Social Worker assessment / plan: Holiday representative (CSW) received verbal consult from PT that they are recommending rehab. Per PT patient did not want to participate. CSW met with patient to discuss D/C plan. Patient was not alert and oriented and had a sitter at bedside. Patient came to 1C from the BMU. Patient is getting ECT treatments weekly from Dr. Weber Cooks. Patient can't transfer to BMU due to a PICC line for access. When asked if patient wanted to go to rehab patient said no. CSW contacted patient's wife Christian [name]. Per wife they has been married for 60 years and have a 54 year old daughter who was recently married. Per wife patient has been to [facility] before as well as BMU. CSW explained PT's recommendation. CSW also explained that patient's insurance will only approve SNF stay if patient participates with PT. Wife verbalized her understanding. Wife reported that she would like for patient to go to BMU. Wife is  agreeable to SNF search.   FL2 complete. CSW contacted [name] case manager and asked her to review the chart.   Employment status:  Retired Nurse, adult PT Recommendations:  LTAC Information / Referral to community resources:  [place]  Patient/Family's Response to care: Wife is agreeable to SNF search.   Patient/Family's Understanding of and Emotional Response to Diagnosis, Current Treatment, and Prognosis: Wife was pleasant and thanked CSW for visit.   Emotional Assessment Appearance:  Appears stated age Attitude/Demeanor/Rapport:    Affect (typically observed):  Flat Orientation:  Fluctuating Orientation (Suspected and/or reported Sundowners) Alcohol / Substance use:  Not Applicable Psych involvement (Current and /or in the community):  Yes (Comment)  Discharge Needs  Concerns to be addressed:  Discharge Planning Concerns Readmission within the last 30 days:  No Current discharge risk:  Psychiatric Illness Barriers to Discharge:  Continued Medical Work up   Elwyn Reach 08/11/2015, 6:17 PM

## 2015-08-11 NOTE — Procedures (Addendum)
ECT SERVICES Physician's Interval Evaluation & Treatment Note  Patient Identification: Christian [name] MRN:  [id number] Date of Evaluation:  08/11/2015 TX #: [id number]  MADRS:   MMSE:   P.E. Findings: Patient remains almost immobile. Completely passive. No new complaints or finding   Psychiatric Interval Note:  Very depressed. Psychotic. Confused. Withdrawn.  Subjective:  Patient is a 64 y.o. male seen for evaluation for Electroconvulsive Therapy. No specific complaints. Hardly vocalizes  Treatment Summary:   []   Right Unilateral             []  Bilateral   % Energy : 1.0 ms 100%   Impedance: 450 ohms  Seizure Energy Index: 501 V squared  Postictal Suppression Index: 59%  Seizure Concordance Index: 93%  Medications  Pre Shock: Xylocaine 4 mg, labetalol 20 mg, ketamine 120 mg, succinylcholine 120 mg  Post Shock: Labetalol 20 mg nitroglycerin 100 g intravenous  Seizure Duration: 27 seconds by EMG, 55 seconds by EEG   Comments: Follow-up next treatment Wednesday the 14th. I have ordered physical therapy consult to assist with range of motion and try and get him moving so his muscles.atrophy   Lungs:  [x]   Clear to auscultation               []  Other:   Heart:    [x]   Regular rhythm             []  irregular rhythm    [x]   Previous H&P reviewed, patient examined and there are NO CHANGES                 []   Previous H&P reviewed, patient examined and there are changes noted.   [name], MD 9/12/201611:48 AM

## 2015-08-11 NOTE — Consult Note (Signed)
[facility] Face-to-Face Psychiatry Consult   Reason for Consult:  Follow-up for this 64 year old man with bipolar disorder severe with psychotic depression Referring Physician:  Luberta Mutter Patient Identification: Christian [name] MRN:  [id number] Principal Diagnosis: Bipolar disorder, current episode depressed, severe, with psychotic features Diagnosis:   Patient Active Problem List   Diagnosis Date Noted  . Pressure ulcer [L89.90] 07/30/2015  . Protein-calorie malnutrition, severe [E43] 07/30/2015  . Dehydration [E86.0] 07/29/2015  . Bipolar disorder, current episode depressed, severe, with psychotic features [F31.5] 07/09/2015  . Diabetes [E11.9] 07/09/2015  . UTI (urinary tract infection) [N39.0] 06/19/2014  . Positive blood culture [R78.81] 06/19/2014  . Bipolar affective disorder, current episode manic with psychotic symptoms [F31.2] 06/17/2014  . Type II or unspecified type diabetes mellitus with unspecified complication, uncontrolled [E11.8] 06/02/2014  . Other and unspecified hyperlipidemia [E78.5] 06/02/2014  . OSA on CPAP [G47.33] 06/02/2014  . 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 Gout [M10.9] 06/02/2014  . Hypertension [I10]     Total Time spent with patient: 45 minutes  Subjective:   Christian [name] is a 64 y.o. male patient is still not talking much but he tells me that he is doing okay. He is clearly not doing okay but at least he is not complaining as much as he was before. He still is very negative. Update as of Monday the 12th. Patient has no new complaints. Still makes very little eye contact and speaks very little. He will only eat if someone is feeding him by hand. He tells me that he would like to be left alone but then admits that that would be a bad thing if he were completely alone. Affect flat. Patient tolerated ECT very well today without any complaints or complications.  HPI Elements:   Quality:  Severe depression  with suicidal-like behavior. Severity:  Severe and life-threatening. Timing:  Lifelong problem worse for a few months. Duration:  Ongoing issue.  Past Medical History:  Past Medical History  Diagnosis Date  . Mood swings   . Type II or unspecified type diabetes mellitus with unspecified complication, uncontrolled 06/02/2014  . Other and unspecified hyperlipidemia 06/02/2014  . OSA on CPAP 06/02/2014  . 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 Bipolar disorder   . CHF (congestive heart failure)   . CKD (chronic kidney disease), stage III   . Diabetes mellitus without complication   . Hypertension   . Gout   . Bipolar 1 disorder   . OSA on CPAP    History reviewed. No pertinent past surgical history. Family History:  Family History  Problem Relation Age of Onset  . Dementia Mother   . Arthritis Father   . Heart failure Neg Hx   . Kidney failure Neg Hx   . Cancer Neg Hx    Social History:  History  Alcohol Use No     History  Drug Use No    Social History   Social History  . Marital Status: Married    Spouse Name: N/A  . Number of Children: N/A  . Years of Education: N/A   Social History Main Topics  . Smoking status: Never Smoker   . Smokeless tobacco: None  . Alcohol Use: No  . Drug Use: No  . Sexual Activity: Not Currently   Other Topics Concern  . None   Social History Narrative   ** Merged History Encounter **  Additional Social History:                          Allergies:  No Known Allergies  Labs:  Results for orders placed or performed during the hospital encounter of 07/29/15 (from the past 48 hour(s))  Glucose, capillary     Status: Abnormal   Collection Time: 08/10/15  7:26 AM  Result Value Ref Range   Glucose-Capillary 146 (H) 65 - 99 mg/dL  Glucose, capillary     Status: Abnormal   Collection Time: 08/10/15 11:03 AM  Result Value Ref Range   Glucose-Capillary 188 (H) 65 - 99 mg/dL    Glucose, capillary     Status: Abnormal   Collection Time: 08/10/15  4:18 PM  Result Value Ref Range   Glucose-Capillary 181 (H) 65 - 99 mg/dL  Glucose, capillary     Status: Abnormal   Collection Time: 08/11/15  7:19 AM  Result Value Ref Range   Glucose-Capillary 119 (H) 65 - 99 mg/dL  Glucose, capillary     Status: Abnormal   Collection Time: 08/11/15  1:10 PM  Result Value Ref Range   Glucose-Capillary 177 (H) 65 - 99 mg/dL  Glucose, capillary     Status: Abnormal   Collection Time: 08/11/15  4:23 PM  Result Value Ref Range   Glucose-Capillary 143 (H) 65 - 99 mg/dL    Vitals: Blood pressure 146/81, pulse 97, temperature 98.9 F (37.2 C), temperature source Oral, resp. rate 18, height 6' (1.829 m), weight 92.534 kg (204 lb), SpO2 98 %.  Risk to Self: Is patient at risk for suicide?: Yes Risk to Others:   Prior Inpatient Therapy:   Prior Outpatient Therapy:    Current Facility-Administered Medications  Medication Dose Route Frequency Provider Last Rate Last Dose  . 0.9 %  sodium chloride infusion   Intravenous Continuous Katha Hamming, MD 50 mL/hr at 08/11/15 1821    . 0.9 %  sodium chloride infusion  250 mL Intravenous Once Audery Amel, MD      . acetaminophen (TYLENOL) tablet 650 mg  650 mg Oral Q6H PRN Houston Siren, MD   650 mg at 07/31/15 1610   Or  . acetaminophen (TYLENOL) suppository 650 mg  650 mg Rectal Q6H PRN Houston Siren, MD      . allopurinol (ZYLOPRIM) tablet 300 mg  300 mg Oral Daily Houston Siren, MD   300 mg at 08/10/15 0851  . atorvastatin (LIPITOR) tablet 10 mg  10 mg Oral Daily Houston Siren, MD   10 mg at 08/10/15 0853  . cholecalciferol (VITAMIN D) tablet 800 Units  800 Units Oral Daily Houston Siren, MD   800 Units at 08/10/15 0853  . clonazePAM (KLONOPIN) tablet 1 mg  1 mg Oral QHS Houston Siren, MD   1 mg at 08/11/15 2032  . colchicine tablet 0.6 mg  0.6 mg Oral Daily Houston Siren, MD   0.6 mg at 08/10/15 0852  . docusate  sodium (COLACE) capsule 200 mg  200 mg Oral BID Auburn Bilberry, MD   200 mg at 08/11/15 2031  . enoxaparin (LOVENOX) injection 40 mg  40 mg Subcutaneous Q24H Houston Siren, MD   40 mg at 08/11/15 2032  . feeding supplement (GLUCERNA SHAKE) (GLUCERNA SHAKE) liquid 237 mL  237 mL Oral TID WC Houston Siren, MD   237 mL at 08/11/15 1822  . fentaNYL (SUBLIMAZE) injection 25 mcg  25 mcg Intravenous Q5 min PRN Berdine Addison, MD      . fentaNYL (SUBLIMAZE) injection 25 mcg  25 mcg Intravenous Q5 min PRN Yves Dill, MD      . hydrALAZINE (APRESOLINE) tablet 50 mg  50 mg Oral 4 times per day Katha Hamming, MD   50 mg at 08/11/15 1821  . insulin aspart (novoLOG) injection 0-9 Units  0-9 Units Subcutaneous TID WC Auburn Bilberry, MD   1 Units at 08/11/15 1722  . megestrol (MEGACE) 400 MG/10ML suspension 400 mg  400 mg Oral Daily Houston Siren, MD   400 mg at 08/10/15 0854  . metoCLOPramide (REGLAN) injection 10 mg  10 mg Intravenous Once PRN Amy Rice, MD      . metoCLOPramide (REGLAN) injection 10 mg  10 mg Intravenous Once Audery Amel, MD   10 mg at 08/01/15 1300  . metoprolol succinate (TOPROL-XL) 24 hr tablet 200 mg  200 mg Oral Daily Houston Siren, MD   200 mg at 08/10/15 0852  . mirtazapine (REMERON) tablet 45 mg  45 mg Oral QHS Audery Amel, MD   45 mg at 08/11/15 2031  . OLANZapine (ZYPREXA) injection 10 mg  10 mg Intramuscular BID AC Houston Siren, MD   10 mg at 08/11/15 1821  . ondansetron (ZOFRAN) tablet 4 mg  4 mg Oral Q6H PRN Houston Siren, MD       Or  . ondansetron (ZOFRAN) injection 4 mg  4 mg Intravenous Q6H PRN Houston Siren, MD      . ondansetron [facility]) injection 4 mg  4 mg Intravenous Once PRN Berdine Addison, MD      . ondansetron [facility]) injection 4 mg  4 mg Intravenous Once PRN Yves Dill, MD      . polyethylene glycol (MIRALAX / GLYCOLAX) packet 17 g  17 g Oral Daily Katha Hamming, MD   17 g at 08/10/15 0854  . senna (SENOKOT) tablet 8.6 mg  1 tablet  Oral Daily Auburn Bilberry, MD   8.6 mg at 08/10/15 0852  . traZODone (DESYREL) tablet 25 mg  25 mg Oral TID Houston Siren, MD   25 mg at 08/11/15 2032    Musculoskeletal: Strength & Muscle Tone: decreased Gait & Station: unable to stand Patient leans: N/A  Psychiatric Specialty Exam: Physical Exam  Nursing note and vitals reviewed. Constitutional: He appears well-developed. He appears lethargic. He is uncooperative. He has a sickly appearance.  HENT:  Head: Normocephalic and atraumatic.  Eyes: Conjunctivae are normal. Pupils are equal, round, and reactive to light.  Neck: Normal range of motion.  Cardiovascular: Normal heart sounds.   Respiratory: Effort normal.  GI: Soft.  Genitourinary:     Musculoskeletal: Normal range of motion.  Neurological: He appears lethargic.  Skin: Skin is warm and dry.  Psychiatric: His affect is blunt. His speech is delayed. He is slowed and withdrawn. Thought content is delusional. Cognition and memory are impaired. He expresses inappropriate judgment. He exhibits a depressed mood. He is noncommunicative.    Review of Systems  Psychiatric/Behavioral: Positive for depression and memory loss. Negative for suicidal ideas, hallucinations and substance abuse. The patient is nervous/anxious.     Blood pressure 146/81, pulse 97, temperature 98.9 F (37.2 C), temperature source Oral, resp. rate 18, height 6' (1.829 m), weight 92.534 kg (204 lb), SpO2 98 %.Body mass index is 27.66 kg/(m^2).  General Appearance: Disheveled  Eye Contact::  Poor  Speech:  Slow  Volume:  Decreased  Mood:  Hopeless  Affect:  Depressed  Thought Process:  Tangential  Orientation:  Negative  Thought Content:  Delusions  Suicidal Thoughts:  Yes.  without intent/plan  Homicidal Thoughts:  No  Memory:  Negative  Judgement:  Poor  Insight:  Lacking  Psychomotor Activity:  Mannerisms  Concentration:  Poor  Recall:  Poor  Fund of Knowledge:Poor  Language: Poor    Akathisia:  No  Handed:  Right  AIMS (if indicated):     Assets:  Financial Resources/Insurance Housing Intimacy Social Support  ADL's:  Impaired  Cognition: Impaired,  Moderate  Sleep:      Medical Decision Making: Established Problem, Stable/Improving (1), Review of Psycho-Social Stressors (1), Review or order clinical lab tests (1) and Review of Medication Regimen & Side Effects (2)  Treatment Plan Summary: Daily contact with patient to assess and evaluate symptoms and progress in treatment, Medication management and Plan As usual I would like to thank the hospitalist service greatly for continuing to allow the patient to be on the ward. I am ordering a physical therapy consult today to help with his range of motion so that he does not get more atrophied and contracted. Continue ECT treatment next treatment Wednesday. Supportive counseling with the patient continued.  Plan:  Supportive therapy provided about ongoing stressors. Disposition: Continue current treatment plan next ECT Wednesday. Please contact psychiatrist on call over the long weekend if needed.  [name] 08/11/2015 9:38 PM

## 2015-08-11 NOTE — Progress Notes (Signed)
Pt initially wanted to refuse meds. Wife spoke with pt with pt taking his medications. He refused to resposition off his right side. Pt has drunk his supplement. Sitter 1:1 with pt. Limited verbal response; kept eyes closed at intervals;dull affect.

## 2015-08-11 NOTE — Anesthesia Preprocedure Evaluation (Signed)
Anesthesia Evaluation  Patient identified by MRN, date of birth, ID band Patient awake  General Assessment Comment:Patient awake , but refuses to converse  Reviewed: Allergy & Precautions, NPO status , Patient's Chart, lab work & pertinent test results  Airway Mallampati: III  TM Distance: <3 FB     Dental  (+) Poor Dentition   Pulmonary sleep apnea ,    Pulmonary exam normal        Cardiovascular hypertension, Pt. on medications +CHF  Normal cardiovascular exam     Neuro/Psych Depression Bipolar Disorder    GI/Hepatic negative GI ROS, Neg liver ROS,   Endo/Other  diabetes, Well Controlled, Type 2  Renal/GU Renal InsufficiencyRenal disease     Musculoskeletal negative musculoskeletal ROS (+)   Abdominal Normal abdominal exam  (+)   Peds  Hematology negative hematology ROS (+)   Anesthesia Other Findings   Reproductive/Obstetrics                             Anesthesia Physical Anesthesia Plan  ASA: III  Anesthesia Plan: General   Post-op Pain Management:    Induction: Intravenous  Airway Management Planned: Mask  Additional Equipment:   Intra-op Plan:   Post-operative Plan:   Informed Consent: I have reviewed the patients History and Physical, chart, labs and discussed the procedure including the risks, benefits and alternatives for the proposed anesthesia with the patient or authorized representative who has indicated his/her understanding and acceptance.   Dental advisory given  Plan Discussed with: CRNA and Surgeon  Anesthesia Plan Comments:         Anesthesia Quick Evaluation

## 2015-08-11 NOTE — Progress Notes (Signed)
Patient ID: Christian [name], male   DOB: 08/07/1951, 64 y.o.   MRN: [id number]  [facility] Physicians PROGRESS NOTE  PCP: Leanor Rubenstein, MD  HPI/Subjective: Patient is here from psych unit secondary to severe depression requiring IV via PICC line for ECT treatments.  He had one more ECT treatment this morning, now he is wanting to eat.  He is feeling okay IObjective: Filed Vitals:   08/11/15 1315  BP: 119/71  Pulse: 96  Temp: 98.7 F (37.1 C)  Resp: 18   ROS: Review of Systems  Unable to perform ROS, not cooperative. Refused to talk.  PHYSICAL EXAM; HENT' atraumatic, normocephalic. ENT is within normal limits. Nose: No mucosal edema.  Mouth/Throat: No oropharyngeal exudate or posterior oropharyngeal edema.  Eyes: Conjunctivae, EOM and lids are normal. Pupils are equal, round, and reactive to light.  Neck: No JVD present. Carotid bruit is not present. No edema present. No thyroid mass and no thyromegaly present.  Cardiovascular: S1 normal and S2 normal.   present.  Exam reveals no gallop.   No murmur heard. Pulses:      Dorsalis pedis pulses are 2+ on the right side, and 2+ on the left side.  Respiratory: No respiratory distress. He has no wheezes. He has no rhonchi. He has no rales.  GI: Soft. Bowel sounds are normal. There is no tenderness.  Musculoskeletal:       Right ankle: He exhibits  no swelling       Left ankle: He exhibits no swelling.  Lymphadenopathy:    He has no cervical adenopathy.  Neurological: He is alert. No cranial nerve deficit.  Skin: Skin is warm. No rash noted. Nails show no clubbing.  Large Left medial foot blood blister with skin intact.  Psychiatric: His affect is Depressed    Data Reviewed: Basic Metabolic Panel:  Recent Labs Lab 08/05/15 0521 08/09/15 0623  CREATININE 0.69 0.67   CBC: No results for input(s): WBC, NEUTROABS, HGB, HCT, MCV, PLT in the last 168 hours.  Scheduled Meds: . sodium chloride  250 mL Intravenous Once  .  allopurinol  300 mg Oral Daily  . atorvastatin  10 mg Oral Daily  . cholecalciferol  800 Units Oral Daily  . clonazePAM  1 mg Oral QHS  . colchicine  0.6 mg Oral Daily  . docusate sodium  200 mg Oral BID  . enoxaparin (LOVENOX) injection  40 mg Subcutaneous Q24H  . feeding supplement (GLUCERNA SHAKE)  237 mL Oral TID WC  . hydrALAZINE  50 mg Oral 4 times per day  . insulin aspart  0-9 Units Subcutaneous TID WC  . megestrol  400 mg Oral Daily  . metoCLOPramide (REGLAN) injection  10 mg Intravenous Once  . metoprolol succinate  200 mg Oral Daily  . mirtazapine  45 mg Oral QHS  . OLANZapine  10 mg Intramuscular BID AC  . polyethylene glycol  17 g Oral Daily  . senna  1 tablet Oral Daily  . traZODone  25 mg Oral TID    Assessment/Plan:  1. Severe depression: Continue ECT treatment as per psychiatry.  Also on Klonopin , Zyprexa, Remeron. ECT treatments Monday, Wednesday, Friday.  , depressed affect.  Today he is wanting to eat  2. Dehydration, acute on chronic kidney disease stage III.-Clinically improving, decreased IV fluids. Continue maintenance IV fluids through PICC line. Patient needs PICC line secondary to poor IV access, continued need for ECT treatments. 3. Essential hypertension,- continue metoprolol,Cardizem CD  And hydralazine.  High at times. 4. Diabetes type 2- sliding-scale , with coverage. Blood glucose overall well controlled. Po intake still not reliable. continue sliding scale with coverage only 5. Hyperlipidemia unspecified- 6. History of gout on allopurinol and colchicine.  Code Status: Full code  Disposition Plan: To be determined, likely back to inpatient psychiatry  Time spent: 32 minutes minutes  [facility], Breiana Stratmann  [facility] Hospitalists

## 2015-08-11 NOTE — Addendum Note (Signed)
Addendum  created 08/11/15 1715 by Lezlie Octave, MD   Modules edited: Anesthesia Attestations

## 2015-08-11 NOTE — Transfer of Care (Signed)
Immediate Anesthesia Transfer of Care Note  Patient: Christian [name]  Procedure(s) Performed: ECT   Patient Location: PACU  Anesthesia Type:General  Level of Consciousness: awake  Airway & Oxygen Therapy: Patient Spontanous Breathing and Patient connected to face mask oxygen  Post-op Assessment: Report given to RN and Post -op Vital signs reviewed and stable  Post vital signs: Reviewed and stable  Last Vitals:  Filed Vitals:   08/11/15 1221  BP: 130/100  Pulse: 107  Temp: 37.1 C  Resp: 27    Complications: No apparent anesthesia complications

## 2015-08-11 NOTE — Care Management Important Message (Signed)
Important Message  Patient Details  Name: Christian [name] MRN: [id number] Date of Birth: 1951-09-28   Medicare Important Message Given:  Other (see comment)    Gwenette Greet, RN 08/11/2015, 8:52 AM

## 2015-08-12 LAB — CREATININE, SERUM
Creatinine, Ser: 0.69 mg/dL (ref 0.61–1.24)
GFR calc Af Amer: >60 mL/min (ref >60)
GFR calc non Af Amer: >60 mL/min (ref >60)

## 2015-08-12 LAB — GLUCOSE, CAPILLARY
Glucose-Capillary: 123 mg/dL — ABNORMAL HIGH (ref 65–99)
Glucose-Capillary: 297 mg/dL — ABNORMAL HIGH (ref 65–99)
Glucose-Capillary: 204 mg/dL — ABNORMAL HIGH (ref 65–99)
Glucose-Capillary: 118 mg/dL — ABNORMAL HIGH (ref 65–99)

## 2015-08-12 MED ORDER — DILTIAZEM HCL ER COATED BEADS 120 MG PO CP24
120.0000 mg | ORAL_CAPSULE | Freq: Every day | ORAL | Status: DC
  Administered 2015-08-12 – 2015-08-27 (×15): 120 mg via ORAL
  Filled 2015-08-12 (×15): qty 1

## 2015-08-12 MED ORDER — MAGNESIUM CITRATE PO SOLN
1.0000 | Freq: Once | ORAL | Status: AC
  Administered 2015-08-12: 1 via ORAL
  Filled 2015-08-12: qty 296

## 2015-08-12 NOTE — Progress Notes (Signed)
Patient ID: Christian [name], male   DOB: [birth year], 64 y.o.   MRN: [id number]  [facility] Physicians PROGRESS NOTE  PCP: Christian Rubenstein, MD  HPI/Subjective: Patient is here from psych unit secondary to severe depression requiring IV via PICC line for ECT treatments.  He had one more ECT treatment this morning, now he is eating. Somewhat sleepy today  IObjective: Filed Vitals:   08/12/15 0526  BP: 158/80  Pulse:   Temp:   Resp:    ROS: Review of Systems  Unable to p  PHYSICAL EXAM; HENT' atraumatic, normocephalic. ENT is within normal limits. Nose: No mucosal edema.  Mouth/Throat: No oropharyngeal exudate or posterior oropharyngeal edema.  Eyes: Conjunctivae, EOM and lids are normal. Pupils are equal, round, and reactive to light.  Neck: No JVD present. Carotid bruit is not present. No edema present. No thyroid mass and no thyromegaly present.  Cardiovascular: S1 normal and S2 normal.   present.  Exam reveals no gallop.   No murmur heard. Pulses:      Dorsalis pedis pulses are 2+ on the right side, and 2+ on the left side.  Respiratory: No respiratory distress. He has no wheezes. He has no rhonchi. He has no rales.  GI: Soft. Bowel sounds are normal. There is no tenderness.  Musculoskeletal:       Right ankle: He exhibits  no swelling       Left ankle: He exhibits no swelling.  Lymphadenopathy:    He has no cervical adenopathy.  Neurological: He is alert. No cranial nerve deficit.  Skin: Skin is warm. No rash noted. Nails show no clubbing.  Large Left medial foot blood blister with skin intact.  Psychiatric: His affect is Depressed    Data Reviewed: Basic Metabolic Panel:  Recent Labs Lab 08/09/15 0623 08/12/15 0542  CREATININE 0.67 0.69   CBC: No results for input(s): WBC, NEUTROABS, HGB, HCT, MCV, PLT in the last 168 hours.  Scheduled Meds: . sodium chloride  250 mL Intravenous Once  . allopurinol  300 mg Oral Daily  . atorvastatin  10 mg Oral Daily  .  cholecalciferol  800 Units Oral Daily  . clonazePAM  1 mg Oral QHS  . colchicine  0.6 mg Oral Daily  . docusate sodium  200 mg Oral BID  . enoxaparin (LOVENOX) injection  40 mg Subcutaneous Q24H  . feeding supplement (GLUCERNA SHAKE)  237 mL Oral TID WC  . hydrALAZINE  50 mg Oral 4 times per day  . insulin aspart  0-9 Units Subcutaneous TID WC  . megestrol  400 mg Oral Daily  . metoCLOPramide (REGLAN) injection  10 mg Intravenous Once  . metoprolol succinate  200 mg Oral Daily  . mirtazapine  45 mg Oral QHS  . OLANZapine  10 mg Intramuscular BID AC  . polyethylene glycol  17 g Oral Daily  . senna  1 tablet Oral Daily  . traZODone  25 mg Oral TID    Assessment/Plan:  1. Severe depression: Continue ECT treatment as per psychiatry. Also on Klonopin , Zyprexa, Remeron. ECT treatments Monday, Wednesday, Friday. depressed affect. He is eating.  2. Dehydration, acute on chronic kidney disease stage III.-Clinically improving, decreased IV fluids. Continue maintenance IV fluids through PICC line. Patient needs PICC line secondary to poor IV access, continued need for ECT treatments.  3. Essential hypertension,- continue metoprolol,Cardizem CD  And hydralazine. High at times.   4. Diabetes type 2- sliding-scale , with coverage. Blood glucose overall well controlled.  Po intake still not reliable. continue sliding scale with coverage only  5. Hyperlipidemia unspecified  6. History of gout on allopurinol and colchicine.  Code Status: Full code  Disposition Plan: To be determined, likely back to inpatient psychiatry - d/w Dr Christian [name]  Time spent: 32 minutes   [name], Christian [name]  [name]

## 2015-08-12 NOTE — Clinical Social Work Placement (Signed)
   CLINICAL SOCIAL WORK PLACEMENT  NOTE  Date:  08/12/2015  Patient Details  Name: Christian [name] MRN: [id number] Date of Birth: 14-Mar-1951  Clinical Social Work is seeking post-discharge placement for this patient at the Skilled  Nursing Facility level of care (*CSW will initial, date and re-position this form in  chart as items are completed):  Yes   Patient/family provided with [facility] Clinical Social Work Department's list of facilities offering this level of care within the geographic area requested by the patient (or if unable, by the patient's family).  Yes   Patient/family informed of their freedom to choose among providers that offer the needed level of care, that participate in Medicare, Medicaid or managed care program needed by the patient, have an available bed and are willing to accept the patient.  Yes   Patient/family informed of [facility]'s ownership interest in [facility] and [facility], as well as of the fact that they are under no obligation to receive care at these facilities.  PASRR submitted to EDS on 08/11/15     PASRR number received on 08/12/15 (30 day PASARR received. (PASARR expires 09/11/15) )     Existing PASRR number confirmed on       FL2 transmitted to all facilities in geographic area requested by pt/family on 08/11/15     FL2 transmitted to all facilities within larger geographic area on       Patient informed that his/her managed care company has contracts with or will negotiate with certain facilities, including the following:            Patient/family informed of bed offers received.  Patient chooses bed at       Physician recommends and patient chooses bed at      Patient to be transferred to   on  .  Patient to be transferred to facility by       Patient family notified on   of transfer.  Name of family member notified:        PHYSICIAN       Additional Comment:     _______________________________________________ Haig Prophet, LCSW 08/12/2015, 2:54 PM

## 2015-08-12 NOTE — Consult Note (Signed)
[facility] Consult   Reason for Consult:  Follow-up for this 64 year old man with bipolar disorder severe with psychotic depression Referring Physician:  Vianne Bulls Patient Identification: Steffan Caniglia Karp MRN:  [id number] Principal Diagnosis: Bipolar disorder, current episode depressed, severe, with psychotic features Diagnosis:   Patient Active Problem List   Diagnosis Date Noted  . Pressure ulcer [L89.90] 07/30/2015  . Protein-calorie malnutrition, severe [E43] 07/30/2015  . Dehydration [E86.0] 07/29/2015  . Bipolar disorder, current episode depressed, severe, with psychotic features [F31.5] 07/09/2015  . Diabetes [E11.9] 07/09/2015  . UTI (urinary tract infection) [N39.0] 06/19/2014  . Positive blood culture [R78.81] 06/19/2014  . Bipolar affective disorder, current episode manic with psychotic symptoms [F31.2] 06/17/2014  . Type II or unspecified type diabetes mellitus with unspecified complication, uncontrolled [E11.8] 06/02/2014  . Other and unspecified hyperlipidemia [E78.5] 06/02/2014  . OSA on CPAP [G47.33] 06/02/2014  . 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 Gout [M10.9] 06/02/2014  . Hypertension [I10]     Total Time spent with patient: 45 minutes  Subjective:   June Christian [name] is a 64 y.o. male patient is still not talking much but he tells me that he is doing okay. He is clearly not doing okay but at least he is not complaining as much as he was before. He still is very negative. Update as of Tuesday the 13th. Patient actually made eye contact today. Tells me he is feeling bad. Mostly depressed. Spoke a few words. He kept his eyes open for several seconds. Still requires a lot of encouragement to eat or move or cooperate. Tolerating treatment well.  HPI Elements:   Quality:  Severe depression with suicidal-like behavior. Severity:  Severe and life-threatening. Timing:  Lifelong problem worse for a few  months. Duration:  Ongoing issue.  Past Medical History:  Past Medical History  Diagnosis Date  . Mood swings   . Type II or unspecified type diabetes mellitus with unspecified complication, uncontrolled 06/02/2014  . Other and unspecified hyperlipidemia 06/02/2014  . OSA on CPAP 06/02/2014  . 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 Bipolar disorder   . CHF (congestive heart failure)   . CKD (chronic kidney disease), stage III   . Diabetes mellitus without complication   . Hypertension   . Gout   . Bipolar 1 disorder   . OSA on CPAP    History reviewed. No pertinent past surgical history. Family History:  Family History  Problem Relation Age of Onset  . Dementia Mother   . Arthritis Father   . Heart failure Neg Hx   . Kidney failure Neg Hx   . Cancer Neg Hx    Social History:  History  Alcohol Use No     History  Drug Use No    Social History   Social History  . Marital Status: Married    Spouse Name: N/A  . Number of Children: N/A  . Years of Education: N/A   Social History Main Topics  . Smoking status: Never Smoker   . Smokeless tobacco: None  . Alcohol Use: No  . Drug Use: No  . Sexual Activity: Not Currently   Other Topics Concern  . None   Social History Narrative   ** Merged History Encounter **       Additional Social History:  Allergies:  No Known Allergies  Labs:  Results for orders placed or performed during the hospital encounter of 07/29/15 (from the past 48 hour(s))  Glucose, capillary     Status: Abnormal   Collection Time: 08/11/15  7:19 AM  Result Value Ref Range   Glucose-Capillary 119 (H) 65 - 99 mg/dL  Glucose, capillary     Status: Abnormal   Collection Time: 08/11/15  1:10 PM  Result Value Ref Range   Glucose-Capillary 177 (H) 65 - 99 mg/dL  Glucose, capillary     Status: Abnormal   Collection Time: 08/11/15  4:23 PM  Result Value Ref Range    Glucose-Capillary 143 (H) 65 - 99 mg/dL  Glucose, capillary     Status: Abnormal   Collection Time: 08/11/15 10:06 PM  Result Value Ref Range   Glucose-Capillary 275 (H) 65 - 99 mg/dL  Creatinine, serum     Status: None   Collection Time: 08/12/15  5:42 AM  Result Value Ref Range   Creatinine, Ser 0.69 0.61 - 1.24 mg/dL   GFR calc non Af Amer >60 >60 mL/min   GFR calc Af Amer >60 >60 mL/min    Comment: (NOTE) The eGFR has been calculated using the CKD EPI equation. This calculation has not been validated in all clinical situations. eGFR's persistently <60 mL/min signify possible Chronic Kidney Disease.   Glucose, capillary     Status: Abnormal   Collection Time: 08/12/15  7:33 AM  Result Value Ref Range   Glucose-Capillary 123 (H) 65 - 99 mg/dL  Glucose, capillary     Status: Abnormal   Collection Time: 08/12/15 11:22 AM  Result Value Ref Range   Glucose-Capillary 297 (H) 65 - 99 mg/dL  Glucose, capillary     Status: Abnormal   Collection Time: 08/12/15  7:19 PM  Result Value Ref Range   Glucose-Capillary 204 (H) 65 - 99 mg/dL   Comment 1 Document in Chart     Vitals: Blood pressure 143/77, pulse 102, temperature 99 F (37.2 C), temperature source Oral, resp. rate 20, height 6' (1.829 m), weight 92.534 kg (204 lb), SpO2 98 %.  Risk to Self: Is patient at risk for suicide?: Yes Risk to Others:   Prior Inpatient Therapy:   Prior Outpatient Therapy:    Current Facility-Administered Medications  Medication Dose Route Frequency Provider Last Rate Last Dose  . 0.9 %  sodium chloride infusion   Intravenous Continuous Epifanio Lesches, MD 50 mL/hr at 08/12/15 1349    . 0.9 %  sodium chloride infusion  250 mL Intravenous Once Gonzella Lex, MD      . acetaminophen (TYLENOL) tablet 650 mg  650 mg Oral Q6H PRN [name], MD   650 mg at 07/31/15 9163   Or  . acetaminophen (TYLENOL) suppository 650 mg  650 mg Rectal Q6H PRN [name], MD      . allopurinol (ZYLOPRIM)  tablet 300 mg  300 mg Oral Daily [name], MD   300 mg at 08/12/15 8466  . atorvastatin (LIPITOR) tablet 10 mg  10 mg Oral Daily [name], MD   10 mg at 08/12/15 5993  . cholecalciferol (VITAMIN D) tablet 800 Units  800 Units Oral Daily [name], MD   800 Units at 08/12/15 [phone]  . clonazePAM (KLONOPIN) tablet 1 mg  1 mg Oral QHS [name], MD   1 mg at 08/11/15 2032  . colchicine tablet 0.6 mg  0.6 mg  Oral Daily [name], MD   0.6 mg at 08/12/15 1561  . diltiazem (CARDIZEM CD) 24 hr capsule 120 mg  120 mg Oral Daily Max Sane, MD   120 mg at 08/12/15 1555  . docusate sodium (COLACE) capsule 200 mg  200 mg Oral BID Dustin Flock, MD   200 mg at 08/12/15 5379  . enoxaparin (LOVENOX) injection 40 mg  40 mg Subcutaneous Q24H [name], MD   40 mg at 08/11/15 2032  . feeding supplement (GLUCERNA SHAKE) (GLUCERNA SHAKE) liquid 237 mL  237 mL Oral TID WC [name], MD   237 mL at 08/12/15 1733  . fentaNYL (SUBLIMAZE) injection 25 mcg  25 mcg Intravenous Q5 min PRN Gunnar Bulla, MD      . fentaNYL (SUBLIMAZE) injection 25 mcg  25 mcg Intravenous Q5 min PRN Alvin Critchley, MD      . hydrALAZINE (APRESOLINE) tablet 50 mg  50 mg Oral 4 times per day Epifanio Lesches, MD   50 mg at 08/12/15 1832  . insulin aspart (novoLOG) injection 0-9 Units  0-9 Units Subcutaneous TID WC Dustin Flock, MD   5 Units at 08/12/15 1733  . megestrol (MEGACE) 400 MG/10ML suspension 400 mg  400 mg Oral Daily [name], MD   400 mg at 08/12/15 4327  . metoCLOPramide (REGLAN) injection 10 mg  10 mg Intravenous Once PRN Amy Rice, MD      . metoCLOPramide (REGLAN) injection 10 mg  10 mg Intravenous Once Gonzella Lex, MD   10 mg at 08/01/15 1300  . metoprolol succinate (TOPROL-XL) 24 hr tablet 200 mg  200 mg Oral Daily [name], MD   200 mg at 08/12/15 0815  . mirtazapine (REMERON) tablet 45 mg  45 mg Oral QHS Gonzella Lex, MD   45 mg at 08/11/15 2031  . OLANZapine  (ZYPREXA) injection 10 mg  10 mg Intramuscular BID AC [name], MD   10 mg at 08/12/15 0801  . ondansetron (ZOFRAN) tablet 4 mg  4 mg Oral Q6H PRN [name], MD       Or  . ondansetron (ZOFRAN) injection 4 mg  4 mg Intravenous Q6H PRN [name], MD      . ondansetron [facility]) injection 4 mg  4 mg Intravenous Once PRN Gunnar Bulla, MD      . ondansetron [facility]) injection 4 mg  4 mg Intravenous Once PRN Alvin Critchley, MD      . polyethylene glycol (MIRALAX / GLYCOLAX) packet 17 g  17 g Oral Daily Epifanio Lesches, MD   17 g at 08/12/15 6147  . senna (SENOKOT) tablet 8.6 mg  1 tablet Oral Daily Dustin Flock, MD   8.6 mg at 08/12/15 0929  . traZODone (DESYREL) tablet 25 mg  25 mg Oral TID [name], MD   25 mg at 08/12/15 1555    Musculoskeletal: Strength & Muscle Tone: decreased Gait & Station: unable to stand Patient leans: N/A  Psychiatric Specialty Exam: Physical Exam  Nursing note and vitals reviewed. Constitutional: He appears well-developed. He appears lethargic. He is uncooperative. He has a sickly appearance.  HENT:  Head: Normocephalic and atraumatic.  Eyes: Conjunctivae are normal. Pupils are equal, round, and reactive to light.  Neck: Normal range of motion.  Cardiovascular: Normal heart sounds.   Respiratory: Effort normal.  GI: Soft.  Genitourinary:     Musculoskeletal: Normal range of motion.  Neurological: He appears lethargic.  Skin: Skin is warm and dry.  Psychiatric: His affect is blunt. His speech is delayed. He is slowed and withdrawn. Thought content is delusional. Cognition and memory are impaired. He expresses inappropriate judgment. He exhibits a depressed mood. He is noncommunicative.    Review of Systems  Psychiatric/Behavioral: Positive for depression and memory loss. Negative for suicidal ideas, hallucinations and substance abuse. The patient is nervous/anxious.     Blood pressure 143/77, pulse 102, temperature 99 F (37.2  C), temperature source Oral, resp. rate 20, height 6' (1.829 m), weight 92.534 kg (204 lb), SpO2 98 %.Body mass index is 27.66 kg/(m^2).  General Appearance: Disheveled  Eye Contact::  Poor  Speech:  Slow  Volume:  Decreased  Mood:  Hopeless  Affect:  Depressed  Thought Process:  Tangential  Orientation:  Negative  Thought Content:  Delusions  Suicidal Thoughts:  Yes.  without intent/plan  Homicidal Thoughts:  No  Memory:  Negative  Judgement:  Poor  Insight:  Lacking  Psychomotor Activity:  Mannerisms  Concentration:  Poor  Recall:  Poor  Fund of Knowledge:Poor  Language: Poor  Akathisia:  No  Handed:  Right  AIMS (if indicated):     Assets:  Financial Resources/Insurance Housing Intimacy Social Support  ADL's:  Impaired  Cognition: Impaired,  Moderate  Sleep:      Medical Decision Making: Established Problem, Stable/Improving (1), Review of Psycho-Social Stressors (1), Review or order clinical lab tests (1) and Review of Medication Regimen & Side Effects (2)  Treatment Plan Summary: Daily contact with patient to assess and evaluate symptoms and progress in treatment, Medication management and Plan no change to treatment plan. ECT tomorrow. Very much appreciate medicine allowing the patient continue to stay here. Encourage patient as usual to eat. Nothing by mouth however after midnight tonight.  Plan:  Supportive therapy provided about ongoing stressors. Disposition: Continue current treatment plan next ECT Wednesday. Please contact psychiatrist on call over the long weekend if needed.  Malachi Suderman 08/12/2015 8:00 PM

## 2015-08-12 NOTE — Progress Notes (Addendum)
Clinical Child psychotherapist (CSW) faxed additional information to [facility] that was requested. CSW discussed case with Amy [facility] case manager. Per Amy in order for patient to get approval from [facility] to go to SNF he will need to participate in PT and be cleared by psych. CSW will continue to follow and assist as needed.   Jetta Lout, LCSWA [phone]

## 2015-08-12 NOTE — Progress Notes (Signed)
   08/12/15 1015  Clinical Encounter Type  Visited With Patient  Visit Type Initial  Provided pastoral presence and support to patient on unit.  Pt said he was "doing fine".  [employer] Peniel Biel-pager [phone]

## 2015-08-12 NOTE — Plan of Care (Signed)
Problem: Discharge Progression Outcomes Goal: Other Discharge Outcomes/Goals Outcome: Progressing Plan of care progress to goals: 1. Pain-no c/o pain this shift 2. Hemodynamically-             -VSS, afebrile             -IV fluids infusing as ordered             -foley in place for urinary retention             -1:1 sitter at bedside since IVC  3. Complications: BP high, decreased after giving scheduled Hydralazaine  4. Tolerating 2 gram Na diet but poor intake.  5. Pt remains in bed resting comfortably, able to turn self independently

## 2015-08-12 NOTE — Plan of Care (Signed)
Problem: Discharge Progression Outcomes Goal: Other Discharge Outcomes/Goals Outcome: Progressing Plan of care progress to goal for: 1. Pain-no c/o pain this shift 2. Hemodynamically-             -VSS, pt remains afebrile this shift             -IV fluids continue per orders 3. Complications-no evidence of this shift 4. Diet-pt has great po intake this shift 5. Activity-pt refusing to be repositioned

## 2015-08-13 ENCOUNTER — Inpatient Hospital Stay: Payer: Commercial Managed Care - HMO | Admitting: Anesthesiology

## 2015-08-13 ENCOUNTER — Encounter: Payer: Self-pay | Admitting: *Deleted

## 2015-08-13 ENCOUNTER — Inpatient Hospital Stay: Payer: Commercial Managed Care - HMO

## 2015-08-13 ENCOUNTER — Other Ambulatory Visit: Payer: Self-pay | Admitting: *Deleted

## 2015-08-13 LAB — GLUCOSE, CAPILLARY
Glucose-Capillary: 107 mg/dL — ABNORMAL HIGH (ref 65–99)
Glucose-Capillary: 120 mg/dL — ABNORMAL HIGH (ref 65–99)
Glucose-Capillary: 214 mg/dL — ABNORMAL HIGH (ref 65–99)
Glucose-Capillary: 275 mg/dL — ABNORMAL HIGH (ref 65–99)

## 2015-08-13 MED ORDER — LABETALOL HCL 5 MG/ML IV SOLN
20.0000 mg | Freq: Once | INTRAVENOUS | Status: AC
  Administered 2015-08-13: 12:00:00 20 mg via INTRAVENOUS
  Filled 2015-08-13: qty 4

## 2015-08-13 MED ORDER — LABETALOL HCL 5 MG/ML IV SOLN
20.0000 mg | Freq: Once | INTRAVENOUS | Status: AC
  Administered 2015-08-13: 12:00:00 20 mg via INTRAVENOUS

## 2015-08-13 MED ORDER — SUCCINYLCHOLINE CHLORIDE 20 MG/ML IJ SOLN
120.0000 mg | Freq: Once | INTRAMUSCULAR | Status: AC
  Administered 2015-08-13: 120 mg via INTRAVENOUS

## 2015-08-13 MED ORDER — LIDOCAINE HCL (CARDIAC) 20 MG/ML IV SOLN
4.0000 mg | Freq: Once | INTRAVENOUS | Status: AC
  Administered 2015-08-13: 4 mg via INTRAVENOUS

## 2015-08-13 MED ORDER — KETAMINE HCL 10 MG/ML IJ SOLN
120.0000 mg | Freq: Once | INTRAMUSCULAR | Status: AC
  Administered 2015-08-13: 120 mg via INTRAVENOUS

## 2015-08-13 NOTE — Anesthesia Preprocedure Evaluation (Signed)
Anesthesia Evaluation  Patient identified by MRN, date of birth, ID band Patient confused    History of Anesthesia Complications Negative for: history of anesthetic complications  Airway Mallampati: III       Dental no notable dental hx.    Pulmonary sleep apnea ,     + decreased breath sounds      Cardiovascular hypertension, Pt. on medications +CHF  Normal cardiovascular exam     Neuro/Psych Depression Bipolar Disorder    GI/Hepatic negative GI ROS, Neg liver ROS,   Endo/Other  diabetes, Type 1, Insulin Dependent  Renal/GU      Musculoskeletal negative musculoskeletal ROS (+)   Abdominal (+) + obese,   Peds  Hematology negative hematology ROS (+)   Anesthesia Other Findings   Reproductive/Obstetrics                             Anesthesia Physical Anesthesia Plan  ASA: IV  Anesthesia Plan: General   Post-op Pain Management:    Induction: Intravenous  Airway Management Planned: Mask  Additional Equipment:   Intra-op Plan:   Post-operative Plan:   Informed Consent: I have reviewed the patients History and Physical, chart, labs and discussed the procedure including the risks, benefits and alternatives for the proposed anesthesia with the patient or authorized representative who has indicated his/her understanding and acceptance.     Plan Discussed with: CRNA  Anesthesia Plan Comments:         Anesthesia Quick Evaluation

## 2015-08-13 NOTE — Procedures (Signed)
ECT SERVICES Physician's Interval Evaluation & Treatment Note  Patient Identification: Christian [name] MRN:  [id number] Date of Evaluation:  08/13/2015 TX #:  7  MADRS:  39  MMSE:  28  P.E. Findings:   no real change to physical exam  Psychiatric Interval Note:   continues to show some slight improvement in energy and interaction and less psychosis  Subjective:  Patient is a 64 y.o. male seen for evaluation for Electroconvulsive Therapy.  no new complaints  Treatment Summary:   []   Right Unilateral             [x]  Bilateral   % Energy :  1.0 ms 100%   Impedance:  370 ohms  Seizure Energy Index:  719 V squared  Postictal Suppression Index:  Not red  Seizure Concordance Index:  98%  Medications  Pre Shock:  Xylocaine 4 mg, labetalol 20 mg, ketamine 120 mg, succinylcholine 120 mg  Post Shock:    Seizure Duration:  26 seconds by EMG, 26 seconds by EEG   Comments:  next treatment Friday the 16th   Lungs:  [x]   Clear to auscultation               []  Other:   Heart:    [x]   Regular rhythm             []  irregular rhythm    [x]   Previous H&P reviewed, patient examined and there are NO CHANGES                 []   Previous H&P reviewed, patient examined and there are changes noted.   [name], MD 9/14/201612:27 PM

## 2015-08-13 NOTE — Progress Notes (Signed)
Nutrition Follow-up  DOCUMENTATION CODES:   Severe malnutrition in context of acute illness/injury  INTERVENTION:   Meals and Snacks: Cater to patient preferences, continue encouragement of po intake. Recommend liberalizing diet order after ECT treatments to Regular Diet.  Medical Food Supplement Therapy: Continue Glucerna as ordered.   NUTRITION DIAGNOSIS:   Inadequate oral intake related to acute illness as evidenced by meal completion < 25%.  GOAL:   Patient will meet greater than or equal to 90% of their needs  MONITOR:    (Energy Intake, Electrolyte and Renal Profile, Anthropometrics, Skin)  ASSESSMENT:   Pt admitted from behavioral health unit for PICC line placement for ECT treatments.    Diet Order:  Diet 2 gram sodium Room service appropriate?: Yes; Fluid consistency:: Thin    Current Nutrition: Pt NPO this am for ECT treatment.  Food/Nutrition-Related History: Recorded po intake on average 73% of meals for the past week. Per MD note eating yesterday on visit.   Medications: Megace, remeron, miralax, senokot  Electrolyte/Renal Profile and Glucose Profile:   Recent Labs Lab 08/09/15 0623 08/12/15 0542  CREATININE 0.67 0.69   Protein Profile: No results for input(s): ALBUMIN in the last 168 hours.  Gastrointestinal Profile: Last BM: 07/20/2015   Filed Weights   08/06/15 1439 08/07/15 0437 08/13/15 1003  Weight: 203 lb 14.8 oz (92.5 kg) 204 lb (92.534 kg) 207 lb (93.895 kg)    Skin:   (Deep tissure injury to left heel)  BMI:  Body mass index is 28.07 kg/(m^2).    Estimated Nutritional Needs:   Kcal:  2346-2773kcals, BEE: 1778kcals, TEE: (IF 1.1-1.3)(AF 1.2)   Protein:  94-113g protein (1.0-1.2g/kg)   Fluid:  2350-2810mL of fluid (25-61mL/kg)  EDUCATION NEEDS:   Education needs no appropriate at this time   LOW Care Level  Leda Quail, RD, LDN Pager [phone]

## 2015-08-13 NOTE — Transfer of Care (Signed)
Immediate Anesthesia Transfer of Care Note  Patient: Christian [name]  Procedure(s) Performed: * No procedures listed *  Patient Location: PACU  Anesthesia Type:General  Level of Consciousness: sedated and responds to stimulation  Airway & Oxygen Therapy: Patient Spontanous Breathing and Patient connected to face mask oxygen  Post-op Assessment: Report given to RN and Post -op Vital signs reviewed and stable  Post vital signs: Reviewed and stable  Last Vitals:  Filed Vitals:   08/13/15 1222  BP: 165/101  Pulse: 88  Temp: 37.8 C  Resp: 19    Complications: No apparent anesthesia complications

## 2015-08-13 NOTE — Progress Notes (Signed)
Patient ID: Christian [name], male   DOB: [birth year], 64 y.o.   MRN: [id number]  [facility] PROGRESS NOTE  PCP: Leanor Rubenstein, MD  HPI/Subjective: blood pressure somewhat elevated and he is refusing blood pressure medicine before ECT IObjective: Filed Vitals:   08/13/15 0804  BP: 158/91  Pulse: 89  Temp:   Resp:    ROS: Review of Systems  Unable to perform ROS, not cooperative. Refused to talk.  PHYSICAL EXAM; HENT' atraumatic, normocephalic. ENT is within normal limits. Nose: No mucosal edema.  Mouth/Throat: No oropharyngeal exudate or posterior oropharyngeal edema.  Eyes: Conjunctivae, EOM and lids are normal. Pupils are equal, round, and reactive to light.  Neck: No JVD present. Carotid bruit is not present. No edema present. No thyroid mass and no thyromegaly present.  Cardiovascular: S1 normal and S2 normal.   present.  Exam reveals no gallop.   No murmur heard. Pulses:      Dorsalis pedis pulses are 2+ on the right side, and 2+ on the left side.  Respiratory: No respiratory distress. He has no wheezes. He has no rhonchi. He has no rales.  GI: Soft. Bowel sounds are normal. There is no tenderness.  GU: Has indwelling Foley  Musculoskeletal:       Right ankle: He exhibits  no swelling       Left ankle: He exhibits no swelling.  Lymphadenopathy:    He has no cervical adenopathy.  Neurological: He is alert. No cranial nerve deficit.  Skin: Skin is warm. No rash noted. Nails show no clubbing.  Psychiatric: His affect is Depressed    Data Reviewed: Basic Metabolic Panel:  Recent Labs Lab 08/09/15 0623 08/12/15 0542  CREATININE 0.67 0.69   CBC: No results for input(s): WBC, NEUTROABS, HGB, HCT, MCV, PLT in the last 168 hours.  Scheduled Meds: . sodium chloride  250 mL Intravenous Once  . allopurinol  300 mg Oral Daily  . atorvastatin  10 mg Oral Daily  . cholecalciferol  800 Units Oral Daily  . clonazePAM  1 mg Oral QHS  . colchicine  0.6 mg  Oral Daily  . diltiazem  120 mg Oral Daily  . docusate sodium  200 mg Oral BID  . enoxaparin (LOVENOX) injection  40 mg Subcutaneous Q24H  . feeding supplement (GLUCERNA SHAKE)  237 mL Oral TID WC  . hydrALAZINE  50 mg Oral 4 times per day  . insulin aspart  0-9 Units Subcutaneous TID WC  . megestrol  400 mg Oral Daily  . metoCLOPramide (REGLAN) injection  10 mg Intravenous Once  . metoprolol succinate  200 mg Oral Daily  . mirtazapine  45 mg Oral QHS  . OLANZapine  10 mg Intramuscular BID AC  . polyethylene glycol  17 g Oral Daily  . senna  1 tablet Oral Daily  . traZODone  25 mg Oral TID    Assessment/Plan:  1. Severe depression: Continue ECT treatment as per psychiatry.  Also on Klonopin , Zyprexa, Remeron. ECT treatments Monday, Wednesday, Friday. Has depressed affect.   2. Dehydration, acute on chronic kidney disease stage III.-Clinically improving, decreased IV fluids. Continue maintenance IV fluids through PICC line. Patient needs PICC line secondary to poor IV access, continued need for ECT treatments.  3. Essential hypertension,- continue metoprolol,Cardizem CD  And hydralazine. High at times. 4. Diabetes type 2- sliding-scale , with coverage. Blood glucose overall well controlled. Po intake still not reliable. continue sliding scale with coverage only 5. Hyperlipidemia unspecified- 6.  History of gout on allopurinol and colchicine.  Consider getting foley out if Psych is ok.  Code Status: Full code  Disposition Plan: To be determined, likely back to inpatient psychiatry  Time spent: 20 minutes   [facility], Muhanad Torosyan  [facility]

## 2015-08-13 NOTE — Consult Note (Signed)
[facility] Consult   Reason for Consult:  Follow-up for this 64 year old man with bipolar disorder severe with psychotic depression Referring Physician:  Vianne Bulls Patient Identification: Christian [name] MRN:  [id number] Principal Diagnosis: Bipolar disorder, current episode depressed, severe, with psychotic features Diagnosis:   Patient Active Problem List   Diagnosis Date Noted  . Pressure ulcer [L89.90] 07/30/2015  . Protein-calorie malnutrition, severe [E43] 07/30/2015  . Dehydration [E86.0] 07/29/2015  . Bipolar disorder, current episode depressed, severe, with psychotic features [F31.5] 07/09/2015  . Diabetes [E11.9] 07/09/2015  . UTI (urinary tract infection) [N39.0] 06/19/2014  . Positive blood culture [R78.81] 06/19/2014  . Bipolar affective disorder, current episode manic with psychotic symptoms [F31.2] 06/17/2014  . Type II or unspecified type diabetes mellitus with unspecified complication, uncontrolled [E11.8] 06/02/2014  . Other and unspecified hyperlipidemia [E78.5] 06/02/2014  . OSA on CPAP [G47.33] 06/02/2014  . 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 Gout [M10.9] 06/02/2014  . Hypertension [I10]     Total Time spent with patient: 45 minutes  Subjective:   Christian [name] is a 64 y.o. malefollow-up as of Wednesday the 14th. Patient had ECT this morning. Tolerated treatment well. This evening when I came by to see him he clearly seems to be continuing to show improvement. He made much better eye contact. He said a few words spontaneously. The sitter tells me that he fed himself and ate his food today. Christian [name] describes himself as being a little bit down but not feeling terrible the way he did before. He just doesn't look nearly as miserable.  HPI Elements:   Quality:  Severe depression with suicidal-like behavior. Severity:  Severe and life-threatening. Timing:  Lifelong problem worse for a few  months. Duration:  Ongoing issue.  Past Medical History:  Past Medical History  Diagnosis Date  . Mood swings   . Type II or unspecified type diabetes mellitus with unspecified complication, uncontrolled 06/02/2014  . Other and unspecified hyperlipidemia 06/02/2014  . OSA on CPAP 06/02/2014  . 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 Bipolar disorder   . CHF (congestive heart failure)   . CKD (chronic kidney disease), stage III   . Diabetes mellitus without complication   . Hypertension   . Gout   . Bipolar 1 disorder   . OSA on CPAP    History reviewed. No pertinent past surgical history. Family History:  Family History  Problem Relation Age of Onset  . Dementia Mother   . Arthritis Father   . Heart failure Neg Hx   . Kidney failure Neg Hx   . Cancer Neg Hx    Social History:  History  Alcohol Use No     History  Drug Use No    Social History   Social History  . Marital Status: Married    Spouse Name: N/A  . Number of Children: N/A  . Years of Education: N/A   Social History Main Topics  . Smoking status: Never Smoker   . Smokeless tobacco: None  . Alcohol Use: No  . Drug Use: No  . Sexual Activity: Not Currently   Other Topics Concern  . None   Social History Narrative   ** Merged History Encounter **       Additional Social History:  Allergies:  No Known Allergies  Labs:  Results for orders placed or performed during the hospital encounter of 07/29/15 (from the past 48 hour(s))  Glucose, capillary     Status: Abnormal   Collection Time: 08/11/15 10:06 PM  Result Value Ref Range   Glucose-Capillary 275 (H) 65 - 99 mg/dL  Creatinine, serum     Status: None   Collection Time: 08/12/15  5:42 AM  Result Value Ref Range   Creatinine, Ser 0.69 0.61 - 1.24 mg/dL   GFR calc non Af Amer >60 >60 mL/min   GFR calc Af Amer >60 >60 mL/min    Comment: (NOTE) The eGFR has been calculated  using the CKD EPI equation. This calculation has not been validated in all clinical situations. eGFR's persistently <60 mL/min signify possible Chronic Kidney Disease.   Glucose, capillary     Status: Abnormal   Collection Time: 08/12/15  7:33 AM  Result Value Ref Range   Glucose-Capillary 123 (H) 65 - 99 mg/dL  Glucose, capillary     Status: Abnormal   Collection Time: 08/12/15 11:22 AM  Result Value Ref Range   Glucose-Capillary 297 (H) 65 - 99 mg/dL  Glucose, capillary     Status: Abnormal   Collection Time: 08/12/15  7:19 PM  Result Value Ref Range   Glucose-Capillary 204 (H) 65 - 99 mg/dL   Comment 1 Document in Chart   Glucose, capillary     Status: Abnormal   Collection Time: 08/12/15  9:27 PM  Result Value Ref Range   Glucose-Capillary 118 (H) 65 - 99 mg/dL   Comment 1 Notify RN   Glucose, capillary     Status: Abnormal   Collection Time: 08/13/15  7:19 AM  Result Value Ref Range   Glucose-Capillary 107 (H) 65 - 99 mg/dL  Glucose, capillary     Status: Abnormal   Collection Time: 08/13/15  1:30 PM  Result Value Ref Range   Glucose-Capillary 120 (H) 65 - 99 mg/dL  Glucose, capillary     Status: Abnormal   Collection Time: 08/13/15  4:05 PM  Result Value Ref Range   Glucose-Capillary 214 (H) 65 - 99 mg/dL    Vitals: Blood pressure 143/85, pulse 91, temperature 97.5 F (36.4 C), temperature source Oral, resp. rate 18, height 6' (1.829 m), weight 93.895 kg (207 lb), SpO2 100 %.  Risk to Self: Is patient at risk for suicide?: Yes Risk to Others:   Prior Inpatient Therapy:   Prior Outpatient Therapy:    Current Facility-Administered Medications  Medication Dose Route Frequency Provider Last Rate Last Dose  . 0.9 %  sodium chloride infusion   Intravenous Continuous Epifanio Lesches, MD 50 mL/hr at 08/13/15 1249    . acetaminophen (TYLENOL) tablet 650 mg  650 mg Oral Q6H PRN [name], MD   650 mg at 07/31/15 3382   Or  . acetaminophen (TYLENOL) suppository  650 mg  650 mg Rectal Q6H PRN [name], MD      . allopurinol (ZYLOPRIM) tablet 300 mg  300 mg Oral Daily [name], MD   300 mg at 08/13/15 1338  . atorvastatin (LIPITOR) tablet 10 mg  10 mg Oral Daily [name], MD   10 mg at 08/13/15 1338  . cholecalciferol (VITAMIN D) tablet 800 Units  800 Units Oral Daily [name], MD   800 Units at 08/13/15 1338  . clonazePAM (KLONOPIN) tablet 1 mg  1 mg Oral QHS [name],  MD   1 mg at 08/12/15 2022  . colchicine tablet 0.6 mg  0.6 mg Oral Daily [name], MD   0.6 mg at 08/13/15 1339  . diltiazem (CARDIZEM CD) 24 hr capsule 120 mg  120 mg Oral Daily Vipul Shah, MD   120 mg at 08/13/15 0830  . docusate sodium (COLACE) capsule 200 mg  200 mg Oral BID Dustin Flock, MD   200 mg at 08/13/15 1343  . enoxaparin (LOVENOX) injection 40 mg  40 mg Subcutaneous Q24H [name], MD   40 mg at 08/12/15 2023  . feeding supplement (GLUCERNA SHAKE) (GLUCERNA SHAKE) liquid 237 mL  237 mL Oral TID WC [name], MD   237 mL at 08/13/15 1719  . fentaNYL (SUBLIMAZE) injection 25 mcg  25 mcg Intravenous Q5 min PRN Gunnar Bulla, MD      . fentaNYL (SUBLIMAZE) injection 25 mcg  25 mcg Intravenous Q5 min PRN Alvin Critchley, MD      . hydrALAZINE (APRESOLINE) tablet 50 mg  50 mg Oral 4 times per day Epifanio Lesches, MD   50 mg at 08/13/15 1459  . insulin aspart (novoLOG) injection 0-9 Units  0-9 Units Subcutaneous TID WC Dustin Flock, MD   3 Units at 08/13/15 1719  . megestrol (MEGACE) 400 MG/10ML suspension 400 mg  400 mg Oral Daily [name], MD   400 mg at 08/13/15 1345  . metoCLOPramide (REGLAN) injection 10 mg  10 mg Intravenous Once PRN Amy Rice, MD      . metoCLOPramide (REGLAN) injection 10 mg  10 mg Intravenous Once Gonzella Lex, MD   10 mg at 08/01/15 1300  . metoprolol succinate (TOPROL-XL) 24 hr tablet 200 mg  200 mg Oral Daily [name], MD   200 mg at 08/13/15 0830  . mirtazapine (REMERON) tablet 45  mg  45 mg Oral QHS Gonzella Lex, MD   45 mg at 08/12/15 2022  . OLANZapine (ZYPREXA) injection 10 mg  10 mg Intramuscular BID AC [name], MD   10 mg at 08/13/15 1744  . ondansetron (ZOFRAN) tablet 4 mg  4 mg Oral Q6H PRN [name], MD       Or  . ondansetron (ZOFRAN) injection 4 mg  4 mg Intravenous Q6H PRN [name], MD      . ondansetron [facility]) injection 4 mg  4 mg Intravenous Once PRN Gunnar Bulla, MD      . ondansetron [facility]) injection 4 mg  4 mg Intravenous Once PRN Alvin Critchley, MD      . polyethylene glycol (MIRALAX / GLYCOLAX) packet 17 g  17 g Oral Daily Epifanio Lesches, MD   17 g at 08/13/15 1343  . senna (SENOKOT) tablet 8.6 mg  1 tablet Oral Daily Dustin Flock, MD   8.6 mg at 08/13/15 1343  . traZODone (DESYREL) tablet 25 mg  25 mg Oral TID [name], MD   25 mg at 08/13/15 1717    Musculoskeletal: Strength & Muscle Tone: decreased Gait & Station: unable to stand Patient leans: N/A  Psychiatric Specialty Exam: Physical Exam  Nursing note and vitals reviewed. Constitutional: He appears well-developed. He appears lethargic. He is uncooperative. He has a sickly appearance.  HENT:  Head: Normocephalic and atraumatic.  Eyes: Conjunctivae are normal. Pupils are equal, round, and reactive to light.  Neck: Normal range of motion.  Cardiovascular: Normal heart sounds.   Respiratory: Effort normal.  GI:  Soft.  Genitourinary:     Musculoskeletal: Normal range of motion.  Neurological: He appears lethargic.  Skin: Skin is warm and dry.  Psychiatric: His affect is blunt. His speech is delayed. He is slowed and withdrawn. Thought content is delusional. Cognition and memory are impaired. He expresses inappropriate judgment. He exhibits a depressed mood. He is noncommunicative.    Review of Systems  Psychiatric/Behavioral: Positive for depression and memory loss. Negative for suicidal ideas, hallucinations and substance abuse. The patient is  nervous/anxious.     Blood pressure 143/85, pulse 91, temperature 97.5 F (36.4 C), temperature source Oral, resp. rate 18, height 6' (1.829 m), weight 93.895 kg (207 lb), SpO2 100 %.Body mass index is 28.07 kg/(m^2).  General Appearance: Disheveled  Eye Contact::  Poor  Speech:  Slow  Volume:  Decreased  Mood:  Hopeless  Affect:  Depressed  Thought Process:  Tangential  Orientation:  Negative  Thought Content:  Delusions  Suicidal Thoughts:  Yes.  without intent/plan  Homicidal Thoughts:  No  Memory:  Negative  Judgement:  Poor  Insight:  Lacking  Psychomotor Activity:  Mannerisms  Concentration:  Poor  Recall:  Poor  Fund of Knowledge:Poor  Language: Poor  Akathisia:  No  Handed:  Right  AIMS (if indicated):     Assets:  Financial Resources/Insurance Housing Intimacy Social Support  ADL's:  Impaired  Cognition: Impaired,  Moderate  Sleep:      Medical Decision Making: Established Problem, Stable/Improving (1), Review of Psycho-Social Stressors (1), Review or order clinical lab tests (1) and Review of Medication Regimen & Side Effects (2)  Treatment Plan Summary: Daily contact with patient to assess and evaluate symptoms and progress in treatment, Medication management and Plan continuing to improve with bilateral ECT. Tolerating treatment well. Plan is to continue ECT 3 times a week with next treatment scheduled Friday. No scheduled change to psychiatric medicine. Daily encouragement and support. Review of vital signs. I renewed his commitment paperwork today as I believe it may have run out. I think this is appropriate as I still don't believe he would have capacity to consent to hospitalization. I hope that we can continue to get physical therapy coming by to do some range of motion with him.  Plan:  Supportive therapy provided about ongoing stressors. Disposition: Continue current treatment plan next ECT Friday. Please contact psychiatrist on call over the long weekend if  needed.    08/13/2015 7:20 PM

## 2015-08-13 NOTE — Plan of Care (Signed)
Problem: Discharge Progression Outcomes Goal: Other Discharge Outcomes/Goals Outcome: Progressing VSS. Denies pain. Pt had an ECT today, a&ox3. Clear speech, slow to respond, refuses care at times. Foley removed at 1726, night RN to follow up. Tolerates diet. Sitter at the bedside.

## 2015-08-13 NOTE — Anesthesia Postprocedure Evaluation (Signed)
  Anesthesia Post-op Note  Patient: Christian [name]  Procedure(s) Performed: * No procedures listed *  Anesthesia type:General  Patient location: PACU  Post pain: Pain level controlled  Post assessment: Post-op Vital signs reviewed, Patient's Cardiovascular Status Stable, Respiratory Function Stable, Patent Airway and No signs of Nausea or vomiting  Post vital signs: Reviewed and stable  Last Vitals:  Filed Vitals:   08/13/15 1231  BP:   Pulse: 85  Temp: 37.3 C  Resp: 18    Level of consciousness: awake, alert  and patient cooperative  Complications: No apparent anesthesia complications

## 2015-08-13 NOTE — Care Management Important Message (Signed)
Important Message  Patient Details  Name: Christian [name] MRN: [id number] Date of Birth: 28-Aug-1951   Medicare Important Message Given:  Yes-second notification given    Olegario Messier A Allmond 08/13/2015, 9:07 AM

## 2015-08-13 NOTE — Progress Notes (Signed)
Disposition is undetermined at this time. Patient will not be able to go to SNF unless cleared by psych and participates in PT. Clinical Social Worker (CSW) will continue to follow and assist as needed.   Jetta Lout, LCSWA [phone]

## 2015-08-14 LAB — GLUCOSE, CAPILLARY
Glucose-Capillary: 134 mg/dL — ABNORMAL HIGH (ref 65–99)
Glucose-Capillary: 140 mg/dL — ABNORMAL HIGH (ref 65–99)
Glucose-Capillary: 310 mg/dL — ABNORMAL HIGH (ref 65–99)
Glucose-Capillary: 190 mg/dL — ABNORMAL HIGH (ref 65–99)
Glucose-Capillary: 192 mg/dL — ABNORMAL HIGH (ref 65–99)

## 2015-08-14 NOTE — Plan of Care (Signed)
Problem: Discharge Progression Outcomes Goal: Other Discharge Outcomes/Goals Outcome: Progressing Pt is alert, refuses to answer orientation questions. No c/o pain, refuses some medications. Has not voided since foley catheter was removed, Dr. Juliene Pina notified, told to reevaluate with rounding physicians. This is not new for patient, has a hx of holding urine for long periods of time. Sitter remains at bedside.

## 2015-08-14 NOTE — Plan of Care (Signed)
Problem: Discharge Progression Outcomes Goal: Other Discharge Outcomes/Goals Outcome: Progressing Plan of care progress to goal for: 1. Discharge Plan:         No Discharge Plan at Current Time.           2. Pain:         Denies pain. 3. Hemodynamically Stable:         VSS.         Afebrile.         Remains on IVF's.         Increased UOP.            4. Complications:         Sitter at Bedside 24:7. 5. Diet:         2gm Sodium Diet. Increased Appetite. Tolerating Well.         Sitter Feeds. 6. Activity:         OOB to Chair with PT, sitter, and CNA. Tolerated well.

## 2015-08-14 NOTE — Progress Notes (Signed)
PT Cancellation Note  Patient Details Name: Christian [name] MRN: [id number] DOB: 1951/09/02   Cancelled Treatment:    Reason Eval/Treat Not Completed: Patient declined, no reason specified (Patient adamantly refusing participation with PT session this date, becoming increasingly agitated, cussing with contined efforts.  Physically resistant to facilitation by therapist.  Will continue efforts as patient allows.)   Khadija Thier H. Manson Passey, PT, DPT, NCS 08/14/2015, 3:27 PM [phone]

## 2015-08-14 NOTE — Progress Notes (Signed)
Patient ID: BRAELEN FRUIN, male   DOB: 1951-03-20, 64 y.o.   MRN: [id number] [facility] PROGRESS NOTE  PCP: Leanor Rubenstein, MD  HPI/Subjective: The patient has poor eye contact and closes his eyes. He answered some yes or no questions and did not offer any physical complaints. I fed the patient entire Glucerna drink. Patient needs to be fed at every meal. As per the aide, he has not urinated since yesterday. He does not remember me from when I followed him on the psychiatry floor. Objective: Filed Vitals:   08/14/15 0429  BP: 138/72  Pulse: 94  Temp: 97.9 F (36.6 C)  Resp: 18    Filed Weights   08/06/15 1439 08/07/15 0437 08/13/15 1003  Weight: 92.5 kg (203 lb 14.8 oz) 92.534 kg (204 lb) 93.895 kg (207 lb)    ROS: Review of Systems  Respiratory: Negative for cough and shortness of breath.   Cardiovascular: Negative for chest pain.  Gastrointestinal: Negative for nausea, vomiting and abdominal pain.  Neurological: Positive for weakness.  Psychiatric/Behavioral: Positive for depression.   Exam: Physical Exam  HENT:  Nose: No mucosal edema.  Mouth/Throat: No oropharyngeal exudate or posterior oropharyngeal edema.  Eyes: Conjunctivae are normal. Pupils are equal, round, and reactive to light.  I would seem swollen today.  Neck: No JVD present. Carotid bruit is not present. No edema present. No thyroid mass and no thyromegaly present.  Cardiovascular: S1 normal and S2 normal.  Exam reveals no gallop.   No murmur heard. Pulses:      Dorsalis pedis pulses are 2+ on the right side, and 2+ on the left side.  Respiratory: No respiratory distress. He has no wheezes. He has no rhonchi. He has no rales.  GI: Soft. Bowel sounds are normal. There is no tenderness.  Musculoskeletal:       Right ankle: He exhibits swelling.       Left ankle: He exhibits swelling.  Lymphadenopathy:    He has no cervical adenopathy.  Neurological: He is alert.  Skin: Skin is warm. No  rash noted. Nails show no clubbing.  Psychiatric: He exhibits a depressed mood.     CBG:  Recent Labs Lab 08/13/15 0719 08/13/15 1330 08/13/15 1605 08/13/15 2100 08/14/15 0749  GLUCAP 107* 120* 214* 275* 140*    Scheduled Meds: . allopurinol  300 mg Oral Daily  . atorvastatin  10 mg Oral Daily  . cholecalciferol  800 Units Oral Daily  . clonazePAM  1 mg Oral QHS  . colchicine  0.6 mg Oral Daily  . diltiazem  120 mg Oral Daily  . docusate sodium  200 mg Oral BID  . enoxaparin (LOVENOX) injection  40 mg Subcutaneous Q24H  . feeding supplement (GLUCERNA SHAKE)  237 mL Oral TID WC  . hydrALAZINE  50 mg Oral 4 times per day  . insulin aspart  0-9 Units Subcutaneous TID WC  . megestrol  400 mg Oral Daily  . metoCLOPramide (REGLAN) injection  10 mg Intravenous Once  . metoprolol succinate  200 mg Oral Daily  . mirtazapine  45 mg Oral QHS  . OLANZapine  10 mg Intramuscular BID AC  . polyethylene glycol  17 g Oral Daily  . senna  1 tablet Oral Daily  . traZODone  25 mg Oral TID   Continuous Infusions: . sodium chloride 50 mL/hr at 08/14/15 0506    Assessment/Plan:  1. Failure to thrive, dehydration, severe depression. ECT treatments Monday Wednesday Friday as per Dr.  Clapacs. Patient on IV fluids to maintain hydration status. Patient must be fed each meal. I said the patient a Glucerna drink today. All other psychiatric medications as per psychiatry. 2. Essential hypertension- blood pressure controlled today. 3. Type 2 diabetes- difficult to manage based on him not eating. Patient is on sliding scale at this point. 4. Hyperlipidemia unspecified on atorvastatin 5. History of gout on allopurinol  Code Status:     Code Status Orders        Start     Ordered   07/29/15 2027  Full code   Continuous     07/29/15 2028      Disposition Plan: To be determined  Consultants:  Psychiatry  Procedures:  ECT treatments  Time spent: 20 minutes  Alford Highland  [facility]

## 2015-08-14 NOTE — Progress Notes (Signed)
Inpatient Diabetes Program Recommendations  AACE/ADA: New Consensus Statement on Inpatient Glycemic Control (2015)  Target Ranges:  Prepandial:   less than 140 mg/dL      Peak postprandial:   less than 180 mg/dL (1-2 hours)      Critically ill patients:  140 - 180 mg/dL   Results for TRYSTEN, PRESUTTI (MRN [id number]) as of 08/14/2015 08:11  Ref. Range 08/13/2015 07:19 08/13/2015 13:30 08/13/2015 16:05 08/13/2015 21:00 08/14/2015 07:49  Glucose-Capillary Latest Ref Range: 65-99 mg/dL 235 (H) 573 (H) 220 (H) 275 (H) 140 (H)  Results for Christian [name], Christian [name] (MRN [id number]) as of 08/14/2015 08:11  Ref. Range 08/12/2015 07:33 08/12/2015 11:22 08/12/2015 19:19 08/12/2015 21:27  Glucose-Capillary Latest Ref Range: 65-99 mg/dL 762 (H) 831 (H) 517 (H) 118 (H)    Review of Glycemic Control  Current orders for Inpatient glycemic control: Novolog 0-9 units TID with meals  Inpatient Diabetes Program Recommendations: Insulin - Meal Coverage: May want to consider ordering Novolog 3 units TID with meals for meal coverage since post prandial glucose is consistently elevated.  Thanks, Orlando Penner, RN, MSN, CCRN, CDE Diabetes Coordinator Inpatient Diabetes Program [phone] (Team Pager from 8am to 5pm) [phone] ([facility] office) [phone] [facility] office) [phone] [facility] office)

## 2015-08-14 NOTE — Consult Note (Signed)
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 Pressure ulcer [L89.90] 07/30/2015  . Protein-calorie malnutrition, severe [E43] 07/30/2015  . Dehydration [E86.0] 07/29/2015  . Bipolar disorder, current episode depressed, severe, with psychotic features [F31.5] 07/09/2015  . Diabetes [E11.9] 07/09/2015  . UTI (urinary tract infection) [N39.0] 06/19/2014  . Positive blood culture [R78.81] 06/19/2014  . Bipolar affective disorder, current episode manic with psychotic symptoms [F31.2] 06/17/2014  . Type II or unspecified type diabetes mellitus with unspecified complication, uncontrolled [E11.8] 06/02/2014  . Other and unspecified hyperlipidemia [E78.5] 06/02/2014  . OSA on CPAP [G47.33] 06/02/2014  . 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 Gout [M10.9] 06/02/2014  . Hypertension [I10]     Total Time spent with patient: 30 minutes  Subjective:   Christian [name] is a 64 y.o. male patient admitted with "I'm all right". He actually said that to me.Marland Kitchen  HPI:  Patient interviewed. Chart reviewed. Case discussed with the sitter who has been with him for several days. Patient is up out of bed today. He was able to transfer on his own. He is eating very well although he still will only eat if people assistant feed him most of the time. Subjectively he says he is all right. He has better insight and realizes that he is in the hospital for severe depression. Still says he feels very tired. Denies suicidal intent. No other new complaints. Vital  signs are staying stable. HPI Elements:   Quality:  depression withdrawal psychosis delusions. Severity:  severe and life-threatening with starvation and suicidal ideation. Timing:  still ongoing and has been here for a couple months. Duration:  ongoing. Context:  history of severe bipolar disorder.  Past Medical History:  Past Medical History  Diagnosis Date  . Mood swings   . Type II or unspecified type diabetes mellitus with unspecified complication, uncontrolled 06/02/2014  . Other and unspecified hyperlipidemia 06/02/2014  . OSA on CPAP 06/02/2014  . 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 Bipolar disorder   . CHF (congestive heart failure)   . CKD (chronic kidney disease), stage III   . Diabetes mellitus without complication   . Hypertension   . Gout   . Bipolar 1 disorder   . OSA on CPAP    History reviewed. No pertinent past surgical history. Family History:  Family History  Problem Relation Age of Onset  . Dementia Mother   . Arthritis Father   . Heart failure Neg Hx   . Kidney failure Neg Hx   . Cancer Neg Hx    Social History:  History  Alcohol Use No     History  Drug Use No    Social History   Social History  . Marital Status: Married    Spouse Name: N/A  . Number of Children: N/A  . Years of Education: N/A   Social History Main Topics  . Smoking status: Never Smoker   . Smokeless tobacco: None  . Alcohol Use: No  . Drug Use: No  . Sexual Activity: Not Currently   Other Topics Concern  .  None   Social History Narrative   ** Merged History Encounter **       Additional Social History:                          Allergies:  No Known Allergies  Labs:  Results for orders placed or performed during the hospital encounter of 07/29/15 (from the past 48 hour(s))  Glucose, capillary     Status: Abnormal   Collection Time: 08/12/15  7:19 PM  Result Value Ref Range   Glucose-Capillary 204 (H) 65 - 99  mg/dL   Comment 1 Document in Chart   Glucose, capillary     Status: Abnormal   Collection Time: 08/12/15  9:27 PM  Result Value Ref Range   Glucose-Capillary 118 (H) 65 - 99 mg/dL   Comment 1 Notify RN   Glucose, capillary     Status: Abnormal   Collection Time: 08/13/15  7:19 AM  Result Value Ref Range   Glucose-Capillary 107 (H) 65 - 99 mg/dL  Glucose, capillary     Status: Abnormal   Collection Time: 08/13/15  1:30 PM  Result Value Ref Range   Glucose-Capillary 120 (H) 65 - 99 mg/dL  Glucose, capillary     Status: Abnormal   Collection Time: 08/13/15  4:05 PM  Result Value Ref Range   Glucose-Capillary 214 (H) 65 - 99 mg/dL  Glucose, capillary     Status: Abnormal   Collection Time: 08/13/15  9:00 PM  Result Value Ref Range   Glucose-Capillary 275 (H) 65 - 99 mg/dL   Comment 1 Notify RN   Glucose, capillary     Status: Abnormal   Collection Time: 08/14/15  7:49 AM  Result Value Ref Range   Glucose-Capillary 140 (H) 65 - 99 mg/dL   Comment 1 Notify RN   Glucose, capillary     Status: Abnormal   Collection Time: 08/14/15 11:01 AM  Result Value Ref Range   Glucose-Capillary 310 (H) 65 - 99 mg/dL   Comment 1 Notify RN     Vitals: Blood pressure 130/80, pulse 82, temperature 98 F (36.7 C), temperature source Oral, resp. rate 18, height 6' (1.829 m), weight 93.895 kg (207 lb), SpO2 100 %.  Risk to Self: Is patient at risk for suicide?: Yes Risk to Others:   Prior Inpatient Therapy:   Prior Outpatient Therapy:    Current Facility-Administered Medications  Medication Dose Route Frequency Provider Last Rate Last Dose  . 0.9 %  sodium chloride infusion   Intravenous Continuous Katha Hamming, MD 50 mL/hr at 08/14/15 0506    . acetaminophen (TYLENOL) tablet 650 mg  650 mg Oral Q6H PRN Houston Siren, MD   650 mg at 07/31/15 6789   Or  . acetaminophen (TYLENOL) suppository 650 mg  650 mg Rectal Q6H PRN Houston Siren, MD      . allopurinol (ZYLOPRIM) tablet 300 mg   300 mg Oral Daily Houston Siren, MD   300 mg at 08/14/15 1154  . atorvastatin (LIPITOR) tablet 10 mg  10 mg Oral Daily Houston Siren, MD   10 mg at 08/14/15 1151  . cholecalciferol (VITAMIN D) tablet 800 Units  800 Units Oral Daily Houston Siren, MD   800 Units at 08/14/15 1152  . clonazePAM (KLONOPIN) tablet 1 mg  1 mg Oral QHS Houston Siren, MD   1 mg at 08/13/15 2221  . colchicine tablet 0.6 mg  0.6  mg Oral Daily Houston Siren, MD   0.6 mg at 08/14/15 1151  . diltiazem (CARDIZEM CD) 24 hr capsule 120 mg  120 mg Oral Daily Delfino Lovett, MD   120 mg at 08/14/15 1152  . docusate sodium (COLACE) capsule 200 mg  200 mg Oral BID Auburn Bilberry, MD   200 mg at 08/14/15 1152  . enoxaparin (LOVENOX) injection 40 mg  40 mg Subcutaneous Q24H Houston Siren, MD   40 mg at 08/13/15 2222  . feeding supplement (GLUCERNA SHAKE) (GLUCERNA SHAKE) liquid 237 mL  237 mL Oral TID WC Houston Siren, MD   237 mL at 08/14/15 1200  . fentaNYL (SUBLIMAZE) injection 25 mcg  25 mcg Intravenous Q5 min PRN Berdine Addison, MD      . fentaNYL (SUBLIMAZE) injection 25 mcg  25 mcg Intravenous Q5 min PRN Yves Dill, MD      . hydrALAZINE (APRESOLINE) tablet 50 mg  50 mg Oral 4 times per day Katha Hamming, MD   50 mg at 08/14/15 1152  . insulin aspart (novoLOG) injection 0-9 Units  0-9 Units Subcutaneous TID WC Auburn Bilberry, MD   7 Units at 08/14/15 1155  . megestrol (MEGACE) 400 MG/10ML suspension 400 mg  400 mg Oral Daily Houston Siren, MD   400 mg at 08/14/15 1151  . metoCLOPramide (REGLAN) injection 10 mg  10 mg Intravenous Once PRN Amy Rice, MD      . metoCLOPramide (REGLAN) injection 10 mg  10 mg Intravenous Once Audery Amel, MD   10 mg at 08/01/15 1300  . metoprolol succinate (TOPROL-XL) 24 hr tablet 200 mg  200 mg Oral Daily Houston Siren, MD   200 mg at 08/14/15 1152  . mirtazapine (REMERON) tablet 45 mg  45 mg Oral QHS Audery Amel, MD   45 mg at 08/12/15 2022  . OLANZapine (ZYPREXA) injection  10 mg  10 mg Intramuscular BID AC Houston Siren, MD   10 mg at 08/13/15 1744  . ondansetron (ZOFRAN) tablet 4 mg  4 mg Oral Q6H PRN Houston Siren, MD       Or  . ondansetron (ZOFRAN) injection 4 mg  4 mg Intravenous Q6H PRN Houston Siren, MD      . ondansetron [facility]) injection 4 mg  4 mg Intravenous Once PRN Berdine Addison, MD      . ondansetron [facility]) injection 4 mg  4 mg Intravenous Once PRN Yves Dill, MD      . polyethylene glycol (MIRALAX / GLYCOLAX) packet 17 g  17 g Oral Daily Katha Hamming, MD   17 g at 08/14/15 1000  . senna (SENOKOT) tablet 8.6 mg  1 tablet Oral Daily Auburn Bilberry, MD   8.6 mg at 08/14/15 1152  . traZODone (DESYREL) tablet 25 mg  25 mg Oral TID Houston Siren, MD   25 mg at 08/14/15 1153    Musculoskeletal: Strength & Muscle Tone: decreased Gait & Station: unable to stand Patient leans: N/A  Psychiatric Specialty Exam: Physical Exam  Nursing note and vitals reviewed. Constitutional: He appears well-developed and well-nourished.  HENT:  Head: Normocephalic and atraumatic.  Eyes: Conjunctivae are normal. Pupils are equal, round, and reactive to light.  Neck: Normal range of motion.  Cardiovascular: Normal heart sounds.   Respiratory: Effort normal.  GI: Soft.  Musculoskeletal: Normal range of motion.  Neurological: He is alert.  Skin: Skin is warm and dry.  Psychiatric: His affect is blunt.  His speech is delayed. He is slowed and withdrawn. He expresses impulsivity. He exhibits a depressed mood. He exhibits abnormal recent memory.    Review of Systems  Constitutional: Positive for malaise/fatigue.  HENT: Negative.   Eyes: Negative.   Respiratory: Negative.   Cardiovascular: Negative.   Gastrointestinal: Negative.   Musculoskeletal: Negative.   Skin: Negative.   Neurological: Positive for weakness.  Psychiatric/Behavioral: Positive for depression and memory loss. Negative for suicidal ideas, hallucinations and substance abuse. The  patient is nervous/anxious and has insomnia.     Blood pressure 130/80, pulse 82, temperature 98 F (36.7 C), temperature source Oral, resp. rate 18, height 6' (1.829 m), weight 93.895 kg (207 lb), SpO2 100 %.Body mass index is 28.07 kg/(m^2).  General Appearance: Disheveled  Eye Solicitor::  Fair  Speech:  Garbled and Slow  Volume:  Decreased  Mood:  Euthymic  Affect:  Depressed  Thought Process:  Circumstantial  Orientation:  Full (Time, Place, and Person)  Thought Content:  Delusions  Suicidal Thoughts:  No  Homicidal Thoughts:  No  Memory:  Immediate;   Fair Recent;   Fair Remote;   Fair  Judgement:  Impaired  Insight:  Shallow  Psychomotor Activity:  Decreased  Concentration:  Poor  Recall:  Poor  Fund of Knowledge:Fair  Language: Fair  Akathisia:  No  Handed:  Right  AIMS (if indicated):     Assets:  Financial Resources/Insurance Housing Intimacy Resilience Social Support  ADL's:  Impaired  Cognition: Impaired,  Mild  Sleep:      Medical Decision Making: Established Problem, Stable/Improving (1), Review of Psycho-Social Stressors (1), Review or order clinical lab tests (1), Review and summation of old records (2) and Review of Medication Regimen & Side Effects (2)  Treatment Plan Summary: Medication management and Plan patient is continuing to be on the medicine service because of his need for intravenous access which could only be done using a PICC line. He is starting to eat a little better which is good news. Primary treatment is ECT. He is tolerating it well and no longer resisting. Clearly showing some improvement but also still very impaired and needing one-on-one attention for ADLs. Blood pressure is stable. No obvious new medical problem sugars are staying stable. No gout complaints. Also healing up. Pain under control. ECT bilateral will be done again tomorrow, Friday and into next week. My deep appreciation still to the hospitalist service for continuing to allow  this patient to be on the medicine ward. Supportive counseling and educational counseling done with the patient. No other change to medicine.  Plan:  Recommend psychiatric Inpatient admission when medically cleared. Supportive therapy provided about ongoing stressors. Disposition: continue on medicine ward for now. ECT 3 times a week. Continue current medicine and monitoring.  [name] 08/14/2015 2:49 PM

## 2015-08-15 ENCOUNTER — Inpatient Hospital Stay: Payer: Commercial Managed Care - HMO | Admitting: Anesthesiology

## 2015-08-15 ENCOUNTER — Encounter: Payer: Self-pay | Admitting: *Deleted

## 2015-08-15 ENCOUNTER — Other Ambulatory Visit: Payer: Self-pay | Admitting: *Deleted

## 2015-08-15 ENCOUNTER — Inpatient Hospital Stay: Payer: Commercial Managed Care - HMO

## 2015-08-15 LAB — GLUCOSE, CAPILLARY
Glucose-Capillary: 185 mg/dL — ABNORMAL HIGH (ref 65–99)
Glucose-Capillary: 131 mg/dL — ABNORMAL HIGH (ref 65–99)
Glucose-Capillary: 130 mg/dL — ABNORMAL HIGH (ref 65–99)
Glucose-Capillary: 220 mg/dL — ABNORMAL HIGH (ref 65–99)
Glucose-Capillary: 333 mg/dL — ABNORMAL HIGH (ref 65–99)

## 2015-08-15 MED ORDER — NITROGLYCERIN 0.2 MG/ML ON CALL CATH LAB
100.0000 ug | Freq: Once | INTRAVENOUS | Status: AC
  Administered 2015-08-15: 100 ug via INTRAVENOUS

## 2015-08-15 MED ORDER — LISINOPRIL 10 MG PO TABS
10.0000 mg | ORAL_TABLET | Freq: Every day | ORAL | Status: DC
Start: 2015-08-15 — End: 2015-08-27
  Administered 2015-08-15 – 2015-08-27 (×13): 10 mg via ORAL
  Filled 2015-08-15 (×13): qty 1

## 2015-08-15 MED ORDER — LIDOCAINE HCL (CARDIAC) 20 MG/ML IV SOLN
4.0000 mg | Freq: Once | INTRAVENOUS | Status: AC
  Administered 2015-08-15: 12:00:00 4 mg via INTRAVENOUS

## 2015-08-15 MED ORDER — KETAMINE HCL 10 MG/ML IJ SOLN
120.0000 mg | Freq: Once | INTRAMUSCULAR | Status: AC
  Administered 2015-08-15: 120 mg via INTRAVENOUS

## 2015-08-15 MED ORDER — LABETALOL HCL 5 MG/ML IV SOLN
20.0000 mg | Freq: Once | INTRAVENOUS | Status: AC
  Administered 2015-08-15: 12:00:00 20 mg via INTRAVENOUS

## 2015-08-15 MED ORDER — SODIUM CHLORIDE 0.9 % IV BOLUS (SEPSIS)
1000.0000 mL | INTRAVENOUS | Status: DC | PRN
Start: 2015-08-15 — End: 2015-08-27
  Administered 2015-08-15: 1000 mL via INTRAVENOUS
  Filled 2015-08-15: qty 1000

## 2015-08-15 MED ORDER — OLANZAPINE 5 MG PO TBDP
30.0000 mg | ORAL_TABLET | Freq: Every day | ORAL | Status: DC
  Administered 2015-08-15 – 2015-08-26 (×12): 30 mg via ORAL
  Filled 2015-08-15 (×4): qty 6
  Filled 2015-08-15: qty 3
  Filled 2015-08-15 (×8): qty 6

## 2015-08-15 MED ORDER — LABETALOL HCL 5 MG/ML IV SOLN
20.0000 mg | Freq: Once | INTRAVENOUS | Status: AC
  Administered 2015-08-15: 13:00:00 20 mg via INTRAVENOUS

## 2015-08-15 MED ORDER — SUCCINYLCHOLINE CHLORIDE 20 MG/ML IJ SOLN
120.0000 mg | Freq: Once | INTRAMUSCULAR | Status: AC
  Administered 2015-08-15: 120 mg via INTRAVENOUS

## 2015-08-15 MED ORDER — SODIUM CHLORIDE 0.9 % IV SOLN
250.0000 mL | Freq: Once | INTRAVENOUS | Status: AC
  Administered 2015-08-15: 12:00:00 via INTRAVENOUS

## 2015-08-15 MED ORDER — AMLODIPINE BESYLATE 10 MG PO TABS: 10.0000 mg | ORAL_TABLET | Freq: Every day | ORAL | Status: DC

## 2015-08-15 NOTE — Discharge Summary (Signed)
Physician Discharge Summary Note  Patient:  Christian [name] is an 63 y.o., male MRN:  [id number] DOB:  29-May-1951 Patient phone:  [phone] (home)  Patient address:   [address] [place] [address],  Total Time spent with patient: 20 minutes  Date of Admission:  07/16/2015 Date of Discharge: 07/29/2015  Reason for Admission:  Patient was admitted to the psychiatry service because of severe major depression symptoms with loss of function, suicidal thoughts, psychotic thoughts, poor self-care. He is being discharged to the medical service in order to have a PICC line placed  Principal Problem: Bipolar disorder, current episode depressed, severe, with psychotic features Discharge Diagnoses: Patient Active Problem List   Diagnosis Date Noted  . Pressure ulcer [L89.90] 07/30/2015  . Protein-calorie malnutrition, severe [E43] 07/30/2015  . Dehydration [E86.0] 07/29/2015  . Bipolar disorder, current episode depressed, severe, with psychotic features [F31.5] 07/09/2015  . Diabetes [E11.9] 07/09/2015  . UTI (urinary tract infection) [N39.0] 06/19/2014  . Positive blood culture [R78.81] 06/19/2014  . Bipolar affective disorder, current episode manic with psychotic symptoms [F31.2] 06/17/2014  . Type II or unspecified type diabetes mellitus with unspecified complication, uncontrolled [E11.8] 06/02/2014  . Other and unspecified hyperlipidemia [E78.5] 06/02/2014  . OSA on CPAP [G47.33] 06/02/2014  . 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 Gout [M10.9] 06/02/2014  . Hypertension [I10]     Musculoskeletal: Strength & Muscle Tone: decreased Gait & Station: unable to stand Patient leans: N/A  Psychiatric Specialty Exam: Physical Exam  ROS  Blood pressure 138/82, pulse 87, temperature 98.1 F (36.7 C), temperature source Oral, resp. rate 18, height 6' (1.829 m), weight 94.076 kg (207 lb 6.4 oz), SpO2 100 %.Body mass index is 28.12 kg/(m^2).   General Appearance: Guarded  Eye Contact::  None  Speech:  Slow and Slurred  Volume:  Decreased  Mood:  Depressed and Irritable  Affect:  Depressed and Inappropriate  Thought Process:  Disorganized  Orientation:  Negative  Thought Content:  Delusions  Suicidal Thoughts:  Yes.  without intent/plan  Homicidal Thoughts:  No  Memory:  Immediate;   Poor Recent;   Poor Remote;   Poor  Judgement:  Impaired  Insight:  Shallow  Psychomotor Activity:  Decreased  Concentration:  Poor  Recall:  Poor  Fund of Knowledge:Fair  Language: Poor  Akathisia:  No  Handed:  Right  AIMS (if indicated):     Assets:  Financial Resources/Insurance Housing Resilience Social Support  ADL's:  Impaired  Cognition: Impaired,  Moderate  Sleep:  Number of Hours: 8.3   Have you used any form of tobacco in the last 30 days? (Cigarettes, Smokeless Tobacco, Cigars, and/or Pipes): No  Has this patient used any form of tobacco in the last 30 days? (Cigarettes, Smokeless Tobacco, Cigars, and/or Pipes) No  Past Medical History:  Past Medical History  Diagnosis Date  . Mood swings   . Type II or unspecified type diabetes mellitus with unspecified complication, uncontrolled 06/02/2014  . Other and unspecified hyperlipidemia 06/02/2014  . OSA on CPAP 06/02/2014  . 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 Bipolar disorder   . CHF (congestive heart failure)   . CKD (chronic kidney disease), stage III   . Diabetes mellitus without complication   . Hypertension   . Gout   . Bipolar 1 disorder   . OSA on CPAP    History reviewed. No pertinent past surgical history. Family History:  Family History  Problem Relation Age of Onset  . Dementia Mother   . Arthritis Father   . Heart failure Neg Hx   . Kidney failure Neg Hx   . Cancer Neg Hx    Social History:  History  Alcohol Use No     History  Drug Use No    Social History   Social History  . Marital Status:  Married    Spouse Name: N/A  . Number of Children: N/A  . Years of Education: N/A   Social History Main Topics  . Smoking status: Never Smoker   . Smokeless tobacco: None  . Alcohol Use: No  . Drug Use: No  . Sexual Activity: Not Currently   Other Topics Concern  . None   Social History Narrative   ** Merged History Encounter **        Past Psychiatric History: Hospitalizations:  Outpatient Care:  Substance Abuse Care:  Self-Mutilation:  Suicidal Attempts:  Violent Behaviors:   Risk to Self: Is patient at risk for suicide?: Yes Risk to Others:   Prior Inpatient Therapy:   Prior Outpatient Therapy:    Level of Care:  [facility]  Hospital Course:  Patient was admitted to the psychiatry ward for treatment for severe major depression. He was referred for ECT. ECT treatment has been impossible to do consistently because it has been impossible to get an IV in this patient. His veins are extremely and accessible and even the anesthesiologist have not been able to do it. I have requested that medicine allow the patient to be transferred to their service so that a PICC line can be placed. PICC lines or not allowed on the psychiatry ward. Medicine has graciously agreed to this and the patient will be transferred to medicine where his medical problems will be stabilized while we continue ECT treatment  Consults:  Hospitalist, anesthesia  Significant Diagnostic Studies:  labs: Blood sugars up otherwise largely unremarkable microbiology: wound culture: negative cardiac graphics: ECG: Unremarkable  Discharge Vitals:   Blood pressure 138/82, pulse 87, temperature 98.1 F (36.7 C), temperature source Oral, resp. rate 18, height 6' (1.829 m), weight 94.076 kg (207 lb 6.4 oz), SpO2 100 %. Body mass index is 28.12 kg/(m^2). Lab Results:   No results found for this or any previous visit (from the past 72 hour(s)).  Physical Findings: AIMS: Facial and Oral Movements Muscles of Facial  Expression: None, normal Lips and Perioral Area: None, normal Jaw: None, normal Tongue: None, normal,Extremity Movements Upper (arms, wrists, hands, fingers): None, normal Lower (legs, knees, ankles, toes): None, normal, Trunk Movements Neck, shoulders, hips: None, normal, Overall Severity Severity of abnormal movements (highest score from questions above): None, normal Incapacitation due to abnormal movements: None, normal Patient's awareness of abnormal movements (rate only patient's report): No Awareness, Dental Status Current problems with teeth and/or dentures?: No Does patient usually wear dentures?: No  CIWA:    COWS:      See Psychiatric Specialty Exam and Suicide Risk Assessment completed by Attending Physician prior to discharge.  Discharge destination:  Other:  Patient is being transferred to the internal medicine service  Is patient on multiple antipsychotic therapies at discharge:  No   Has Patient had three or more failed trials of antipsychotic monotherapy by history:  No    Recommended Plan for Multiple Antipsychotic Therapies: NA  Discharge Instructions    Diet - low sodium heart healthy    Complete by:  As directed  Medication List    STOP taking these medications        glipiZIDE 5 MG 24 hr tablet  Commonly known as:  GLUCOTROL XL     lamoTRIgine 100 MG tablet  Commonly known as:  LAMICTAL     lurasidone 40 MG Tabs tablet  Commonly known as:  LATUDA     QUEtiapine 400 MG tablet  Commonly known as:  SEROQUEL      TAKE these medications      Indication   allopurinol 300 MG tablet  Commonly known as:  ZYLOPRIM  Take 1 tablet (300 mg total) by mouth daily. For gout  Notes to Patient:  gout   Indication:  Gout     amLODipine 10 MG tablet  Commonly known as:  NORVASC  Take 10 mg by mouth daily.  Notes to Patient:  HTN      aspirin EC 81 MG tablet  Take 81 mg by mouth daily.  Notes to Patient:  Heart disease      atorvastatin  10 MG tablet  Commonly known as:  LIPITOR  Take 1 tablet (10 mg total) by mouth daily. For high cholesterol   Indication:  Inherited Heterozygous Hypercholesterolemia     cholecalciferol 400 UNITS Tabs tablet  Commonly known as:  VITAMIN D  Take 800 Units by mouth daily.  Notes to Patient:  Vit d deficiency      clonazePAM 1 MG tablet  Commonly known as:  KLONOPIN  Take 1 tablet (1 mg total) by mouth at bedtime.  Notes to Patient:  Insomnia/anxiety   Indication:  insomnia.     colchicine 0.6 MG tablet  Take 1 tablet (0.6 mg total) by mouth 2 (two) times daily. For Gout pain  Notes to Patient:  gout   Indication:  Gout     diltiazem 120 MG 24 hr capsule  Commonly known as:  CARDIZEM CD  Take 1 capsule (120 mg total) by mouth daily.  Notes to Patient:  HTN/abnormal heart rate      feeding supplement (GLUCERNA SHAKE) Liqd  Take 237 mLs by mouth 3 (three) times daily with meals.  Notes to Patient:  Nutritional supplement      furosemide 20 MG tablet  Commonly known as:  LASIX  Take 20 mg by mouth daily.  Notes to Patient:  edema      hydrALAZINE 25 MG tablet  Commonly known as:  APRESOLINE  Take 1 tablet (25 mg total) by mouth every 6 (six) hours.  Notes to Patient:  HTN      megestrol 400 MG/10ML suspension  Commonly known as:  MEGACE  Take 10 mLs (400 mg total) by mouth daily.  Notes to Patient:  Poor appetite      metoprolol 200 MG 24 hr tablet  Commonly known as:  TOPROL-XL  Take 1 tablet (200 mg total) by mouth daily. For high blood pressure   Indication:  High Blood Pressure     mirtazapine 30 MG tablet  Commonly known as:  REMERON  Take 30 mg by mouth at bedtime.  Notes to Patient:  insomnia      OLANZapine injection  Commonly known as:  ZYPREXA  Inject 10 mg into the muscle 2 (two) times daily before a meal.  Notes to Patient:  psychosis      traZODone 50 MG tablet  Commonly known as:  DESYREL  Take 0.5 tablets (25 mg total) by mouth 3 (three) times  daily.  Notes to  Patient:  depression   Indication:  Aggressive Behavior         Follow-up recommendations:  Activity:  Physical therapy will be requested to increase activity Diet:  Diet regular as much as he can eat Tests:  Continue monitoring blood sugars Other:  Patient will continue ECT treatment and I will continue to follow-up  Comments:  Appreciate assistance of hospitalist service  Total Discharge Time: 15 minutes  Signed: [name] 08/15/2015, 5:05 PM

## 2015-08-15 NOTE — Plan of Care (Signed)
Problem: Discharge Progression Outcomes Goal: Other Discharge Outcomes/Goals Outcome: Progressing Plan of Care Progress to Goal:   Pt took his meds last night but was too lethargic to take 6am meds. Pt BS was checked and it was  130s. Pt was arousing just to voice and report he was really tired. Pt became more alert at the end of the shift. No other signs of distress noted. Will continue to monitor.

## 2015-08-15 NOTE — Anesthesia Preprocedure Evaluation (Signed)
Anesthesia Evaluation  Patient identified by MRN, date of birth, ID band Patient confused    History of Anesthesia Complications Negative for: history of anesthetic complications  Airway Mallampati: III       Dental no notable dental hx.    Pulmonary sleep apnea ,     + decreased breath sounds      Cardiovascular hypertension, Pt. on medications +CHF  Normal cardiovascular exam     Neuro/Psych Depression Bipolar Disorder    GI/Hepatic negative GI ROS, Neg liver ROS,   Endo/Other  diabetes, Type 1, Insulin Dependent  Renal/GU      Musculoskeletal negative musculoskeletal ROS (+)   Abdominal (+) + obese,   Peds  Hematology negative hematology ROS (+)   Anesthesia Other Findings   Reproductive/Obstetrics                             Anesthesia Physical  Anesthesia Plan  ASA: III  Anesthesia Plan: General   Post-op Pain Management:    Induction: Intravenous  Airway Management Planned: Mask  Additional Equipment:   Intra-op Plan:   Post-operative Plan:   Informed Consent: I have reviewed the patients History and Physical, chart, labs and discussed the procedure including the risks, benefits and alternatives for the proposed anesthesia with the patient or authorized representative who has indicated his/her understanding and acceptance.     Plan Discussed with: CRNA  Anesthesia Plan Comments:         Anesthesia Quick Evaluation

## 2015-08-15 NOTE — Consult Note (Signed)
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 Pressure ulcer [L89.90] 07/30/2015  . Protein-calorie malnutrition, severe [E43] 07/30/2015  . Dehydration [E86.0] 07/29/2015  . Bipolar disorder, current episode depressed, severe, with psychotic features [F31.5] 07/09/2015  . Diabetes [E11.9] 07/09/2015  . UTI (urinary tract infection) [N39.0] 06/19/2014  . Positive blood culture [R78.81] 06/19/2014  . Bipolar affective disorder, current episode manic with psychotic symptoms [F31.2] 06/17/2014  . Type II or unspecified type diabetes mellitus with unspecified complication, uncontrolled [E11.8] 06/02/2014  . Other and unspecified hyperlipidemia [E78.5] 06/02/2014  . OSA on CPAP [G47.33] 06/02/2014  . 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 Gout [M10.9] 06/02/2014  . Hypertension [I10]     Total Time spent with patient: 30 minutes  Subjective:   Christian [name] is a 64 y.o. male patient admitted with "I'm all right". He actually said that to me.Marland Kitchen  HPI:  Follow-up Friday the 16th. Patient had ECT bilateral this morning and once again tolerated it well. Once again he was cooperative with treatment. Yesterday when I talked with him he was out of bed and clearly more energetic. Today he is back to being withdrawn but is not hostile or agitated and was cooperative with treatment. He continues to eat better but requires assistance. He talks a little bit but still says that he feels bad. HPI Elements:   Quality:  depression withdrawal  psychosis delusions. Severity:  severe and life-threatening with starvation and suicidal ideation. Timing:  still ongoing and has been here for a couple months. Duration:  ongoing. Context:  history of severe bipolar disorder.  Past Medical History:  Past Medical History  Diagnosis Date  . Mood swings   . Type II or unspecified type diabetes mellitus with unspecified complication, uncontrolled 06/02/2014  . Other and unspecified hyperlipidemia 06/02/2014  . OSA on CPAP 06/02/2014  . 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 Bipolar disorder   . CHF (congestive heart failure)   . CKD (chronic kidney disease), stage III   . Diabetes mellitus without complication   . Hypertension   . Gout   . Bipolar 1 disorder   . OSA on CPAP    History reviewed. No pertinent past surgical history. Family History:  Family History  Problem Relation Age of Onset  . Dementia Mother   . Arthritis Father   . Heart failure Neg Hx   . Kidney failure Neg Hx   . Cancer Neg Hx    Social History:  History  Alcohol Use No     History  Drug Use No    Social History   Social History  . Marital Status: Married    Spouse Name: N/A  . Number of Children: N/A  . Years of Education: N/A   Social History Main Topics  . Smoking status: Never Smoker   . Smokeless tobacco: None  . Alcohol Use: No  . Drug Use: No  . Sexual Activity: Not Currently   Other Topics Concern  . None   Social History Narrative   ** Merged History Encounter **  Additional Social History:                          Allergies:  No Known Allergies  Labs:  Results for orders placed or performed during the hospital encounter of 07/29/15 (from the past 48 hour(s))  Glucose, capillary     Status: Abnormal   Collection Time: 08/13/15  9:00 PM  Result Value Ref Range   Glucose-Capillary 275 (H) 65 - 99 mg/dL   Comment 1 Notify RN   Glucose, capillary     Status: Abnormal    Collection Time: 08/14/15  7:49 AM  Result Value Ref Range   Glucose-Capillary 140 (H) 65 - 99 mg/dL   Comment 1 Notify RN   Glucose, capillary     Status: Abnormal   Collection Time: 08/14/15 11:01 AM  Result Value Ref Range   Glucose-Capillary 310 (H) 65 - 99 mg/dL   Comment 1 Notify RN   Glucose, capillary     Status: Abnormal   Collection Time: 08/14/15  4:27 PM  Result Value Ref Range   Glucose-Capillary 190 (H) 65 - 99 mg/dL   Comment 1 Notify RN   Glucose, capillary     Status: Abnormal   Collection Time: 08/14/15 10:12 PM  Result Value Ref Range   Glucose-Capillary 192 (H) 65 - 99 mg/dL  Glucose, capillary     Status: Abnormal   Collection Time: 08/15/15  6:10 AM  Result Value Ref Range   Glucose-Capillary 131 (H) 65 - 99 mg/dL  Glucose, capillary     Status: Abnormal   Collection Time: 08/15/15  7:18 AM  Result Value Ref Range   Glucose-Capillary 130 (H) 65 - 99 mg/dL  Glucose, capillary     Status: Abnormal   Collection Time: 08/15/15  4:08 PM  Result Value Ref Range   Glucose-Capillary 220 (H) 65 - 99 mg/dL    Vitals: Blood pressure 124/82, pulse 82, temperature 98.6 F (37 C), temperature source Oral, resp. rate 16, height 6' (1.829 m), weight 93.895 kg (207 lb), SpO2 100 %.  Risk to Self: Is patient at risk for suicide?: Yes Risk to Others:   Prior Inpatient Therapy:   Prior Outpatient Therapy:    Current Facility-Administered Medications  Medication Dose Route Frequency Provider Last Rate Last Dose  . 0.9 %  sodium chloride infusion   Intravenous Continuous Katha Hamming, MD 50 mL/hr at 08/15/15 0559    . acetaminophen (TYLENOL) tablet 650 mg  650 mg Oral Q6H PRN Houston Siren, MD   650 mg at 07/31/15 2993   Or  . acetaminophen (TYLENOL) suppository 650 mg  650 mg Rectal Q6H PRN Houston Siren, MD      . allopurinol (ZYLOPRIM) tablet 300 mg  300 mg Oral Daily Houston Siren, MD   300 mg at 08/15/15 1527  . atorvastatin (LIPITOR) tablet 10 mg  10  mg Oral Daily Houston Siren, MD   10 mg at 08/14/15 1151  . cholecalciferol (VITAMIN D) tablet 800 Units  800 Units Oral Daily Houston Siren, MD   800 Units at 08/15/15 1527  . clonazePAM (KLONOPIN) tablet 1 mg  1 mg Oral QHS Houston Siren, MD   1 mg at 08/14/15 2329  . colchicine tablet 0.6 mg  0.6 mg Oral Daily Houston Siren, MD   0.6 mg at 08/15/15 1527  . diltiazem (CARDIZEM CD) 24 hr capsule 120 mg  120 mg Oral  Daily Delfino Lovett, MD   120 mg at 08/15/15 0908  . docusate sodium (COLACE) capsule 200 mg  200 mg Oral BID Auburn Bilberry, MD   200 mg at 08/15/15 1527  . enoxaparin (LOVENOX) injection 40 mg  40 mg Subcutaneous Q24H Houston Siren, MD   40 mg at 08/14/15 2330  . feeding supplement (GLUCERNA SHAKE) (GLUCERNA SHAKE) liquid 237 mL  237 mL Oral TID WC Houston Siren, MD   237 mL at 08/14/15 1728  . hydrALAZINE (APRESOLINE) tablet 50 mg  50 mg Oral 4 times per day Katha Hamming, MD   50 mg at 08/15/15 0907  . insulin aspart (novoLOG) injection 0-9 Units  0-9 Units Subcutaneous TID WC Auburn Bilberry, MD   2 Units at 08/14/15 1726  . lisinopril (PRINIVIL,ZESTRIL) tablet 10 mg  10 mg Oral Daily Alford Highland, MD   10 mg at 08/15/15 1528  . megestrol (MEGACE) 400 MG/10ML suspension 400 mg  400 mg Oral Daily Houston Siren, MD   400 mg at 08/15/15 1527  . metoCLOPramide (REGLAN) injection 10 mg  10 mg Intravenous Once Audery Amel, MD   10 mg at 08/01/15 1300  . metoprolol succinate (TOPROL-XL) 24 hr tablet 200 mg  200 mg Oral Daily Houston Siren, MD   200 mg at 08/15/15 0908  . mirtazapine (REMERON) tablet 45 mg  45 mg Oral QHS Audery Amel, MD   45 mg at 08/14/15 2330  . OLANZapine (ZYPREXA) injection 10 mg  10 mg Intramuscular BID AC Houston Siren, MD   10 mg at 08/13/15 1744  . ondansetron (ZOFRAN) tablet 4 mg  4 mg Oral Q6H PRN Houston Siren, MD       Or  . ondansetron (ZOFRAN) injection 4 mg  4 mg Intravenous Q6H PRN Houston Siren, MD      . polyethylene  glycol (MIRALAX / GLYCOLAX) packet 17 g  17 g Oral Daily Katha Hamming, MD   17 g at 08/15/15 1527  . senna (SENOKOT) tablet 8.6 mg  1 tablet Oral Daily Auburn Bilberry, MD   8.6 mg at 08/15/15 1527  . traZODone (DESYREL) tablet 25 mg  25 mg Oral TID Houston Siren, MD   25 mg at 08/15/15 1531    Musculoskeletal: Strength & Muscle Tone: decreased Gait & Station: unable to stand Patient leans: N/A  Psychiatric Specialty Exam: Physical Exam  Nursing note and vitals reviewed. Constitutional: He appears well-developed and well-nourished.  HENT:  Head: Normocephalic and atraumatic.  Eyes: Conjunctivae are normal. Pupils are equal, round, and reactive to light.  Neck: Normal range of motion.  Cardiovascular: Normal heart sounds.   Respiratory: Effort normal.  GI: Soft.  Musculoskeletal: Normal range of motion.  Neurological: He is alert.  Skin: Skin is warm and dry.  Psychiatric: His affect is blunt. His speech is delayed. He is slowed and withdrawn. He expresses impulsivity. He exhibits a depressed mood. He exhibits abnormal recent memory.    Review of Systems  Constitutional: Positive for malaise/fatigue.  HENT: Negative.   Eyes: Negative.   Respiratory: Negative.   Cardiovascular: Negative.   Gastrointestinal: Negative.   Musculoskeletal: Negative.   Skin: Negative.   Neurological: Positive for weakness.  Psychiatric/Behavioral: Positive for depression and memory loss. Negative for suicidal ideas, hallucinations and substance abuse. The patient is nervous/anxious and has insomnia.     Blood pressure 124/82, pulse 82, temperature 98.6 F (37 C), temperature source Oral, resp. rate  16, height 6' (1.829 m), weight 93.895 kg (207 lb), SpO2 100 %.Body mass index is 28.07 kg/(m^2).  General Appearance: Disheveled  Eye Solicitor::  Fair  Speech:  Garbled and Slow  Volume:  Decreased  Mood:  Euthymic  Affect:  Depressed  Thought Process:  Circumstantial  Orientation:  Full  (Time, Place, and Person)  Thought Content:  Delusions  Suicidal Thoughts:  No  Homicidal Thoughts:  No  Memory:  Immediate;   Fair Recent;   Fair Remote;   Fair  Judgement:  Impaired  Insight:  Shallow  Psychomotor Activity:  Decreased  Concentration:  Poor  Recall:  Poor  Fund of Knowledge:Fair  Language: Fair  Akathisia:  No  Handed:  Right  AIMS (if indicated):     Assets:  Financial Resources/Insurance Housing Intimacy Resilience Social Support  ADL's:  Impaired  Cognition: Impaired,  Mild  Sleep:      Medical Decision Making: Established Problem, Stable/Improving (1), Review of Psycho-Social Stressors (1), Review or order clinical lab tests (1), Review and summation of old records (2) and Review of Medication Regimen & Side Effects (2)  Treatment Plan Summary: Medication management and Plan Patient is receiving ECT treatment for bipolar disorder depressed with psychotic features severe. Today was treatment #8 although there have been some interruptions in the course that have extended things a little bit. Continues to tolerate treatment well. Patient is showing gradual improvement in his illness. Continue with antidepressives and anti-psychotics as well as ECT. Continue to very much appreciate medicine hosting this patient on the unit. No change to plan. Next ECT treatment scheduled for Monday  Plan:  Recommend psychiatric Inpatient admission when medically cleared. Supportive therapy provided about ongoing stressors. Disposition: continue on medicine ward for now. ECT 3 times a week. Continue current medicine and monitoring. I'm going to increase his dose of Zyprexa to 20 mg at night  [name] 08/15/2015 4:33 PM

## 2015-08-15 NOTE — Procedures (Signed)
ECT SERVICES Physician's Interval Evaluation & Treatment Note  Patient Identification: Christian [name] MRN:  [id number] Date of Evaluation:  08/15/2015 [place] #:  8  MADRS:   MMSE:   P.E. Findings:   no change still to physical exam except that he no longer has a catheter in and they have managed to get him out of bed at times  Psychiatric Interval Note:   talks a little bit more not as psychotic  Subjective:  Patient is a 65 y.o. male seen for evaluation for Electroconvulsive Therapy.  no new complaint  Treatment Summary:   []   Right Unilateral             [x]  Bilateral   % Energy :  1.0 ms, 100%   Impedance:  390 ohms  Seizure Energy Index:  688 V squared  Postictal Suppression Index:  Not red  Seizure Concordance Index:  98%  Medications  Pre Shock:  Xylocaine 4 mg , labetalol 20 mg, ketamine 120 mg, succinylcholine 120 mg  Post Shock:  Labetalol 20 mg  Seizure Duration:  20 seconds by EMG, 30 seconds by EEG   Comments:  next treatment Monday planning treatment Monday Wednesday and Friday into next week   Lungs:  [x]   Clear to auscultation               []  Other:   Heart:    []   Regular rhythm             []  irregular rhythm    [x]   Previous H&P reviewed, patient examined and there are NO CHANGES                 []   Previous H&P reviewed, patient examined and there are changes noted.   [name], MD 9/16/201612:28 PM

## 2015-08-15 NOTE — Transfer of Care (Signed)
Immediate Anesthesia Transfer of Care Note  Patient: Christian [name]  Procedure(s) Performed: * No procedures listed *  Patient Location: PACU  Anesthesia Type:General  Level of Consciousness: patient cooperative and lethargic  Airway & Oxygen Therapy: Patient Spontanous Breathing and Patient connected to face mask oxygen  Post-op Assessment: Report given to RN and Post -op Vital signs reviewed and stable  Post vital signs: Reviewed and stable  Last Vitals:  Filed Vitals:   08/15/15 1242  BP: 150/108  Pulse: 101  Temp: 37.6 C  Resp: 16    Complications: No apparent anesthesia complications

## 2015-08-15 NOTE — Progress Notes (Signed)
Patient ID: Christian [name], male   DOB: 1951-01-15, 64 y.o.   MRN: [id number] [facility] PROGRESS NOTE  PCP: Leanor Rubenstein, MD  HPI/Subjective: Patient seen earlier in the day. He opened his eyes and looked at me. And then put his head back on the pillow. He answered yes or no questions but did not elaborate.   Objective: Filed Vitals:   08/15/15 1346  BP: 125/80  Pulse: 88  Temp: 98.6 F (37 C)  Resp:     Filed Weights   08/07/15 0437 08/13/15 1003 08/15/15 1035  Weight: 92.534 kg (204 lb) 93.895 kg (207 lb) 93.895 kg (207 lb)    ROS: Review of Systems  Respiratory: Negative for cough and shortness of breath.   Cardiovascular: Negative for chest pain.  Gastrointestinal: Negative for nausea, vomiting and abdominal pain.  Neurological: Positive for weakness.  Psychiatric/Behavioral: Positive for depression.   Exam: Physical Exam  HENT:  Nose: No mucosal edema.  Mouth/Throat: No oropharyngeal exudate or posterior oropharyngeal edema.  Eyes: Conjunctivae are normal. Pupils are equal, round, and reactive to light.  I would seem swollen today.  Neck: No JVD present. Carotid bruit is not present. No edema present. No thyroid mass and no thyromegaly present.  Cardiovascular: S1 normal and S2 normal.  Exam reveals no gallop.   No murmur heard. Pulses:      Dorsalis pedis pulses are 2+ on the right side, and 2+ on the left side.  Respiratory: No respiratory distress. He has no wheezes. He has no rhonchi. He has no rales.  GI: Soft. Bowel sounds are normal. There is no tenderness.  Musculoskeletal:       Right ankle: He exhibits swelling.       Left ankle: He exhibits swelling.  Lymphadenopathy:    He has no cervical adenopathy.  Neurological: He is alert.  Skin: Skin is warm. No rash noted. Nails show no clubbing.  Psychiatric: He exhibits a depressed mood.     CBG:  Recent Labs Lab 08/14/15 1101 08/14/15 1627 08/14/15 2212 08/15/15 0610  08/15/15 0718  GLUCAP 310* 190* 192* 131* 130*    Scheduled Meds: . allopurinol  300 mg Oral Daily  . atorvastatin  10 mg Oral Daily  . cholecalciferol  800 Units Oral Daily  . clonazePAM  1 mg Oral QHS  . colchicine  0.6 mg Oral Daily  . diltiazem  120 mg Oral Daily  . docusate sodium  200 mg Oral BID  . enoxaparin (LOVENOX) injection  40 mg Subcutaneous Q24H  . feeding supplement (GLUCERNA SHAKE)  237 mL Oral TID WC  . hydrALAZINE  50 mg Oral 4 times per day  . insulin aspart  0-9 Units Subcutaneous TID WC  . lisinopril  10 mg Oral Daily  . megestrol  400 mg Oral Daily  . metoCLOPramide (REGLAN) injection  10 mg Intravenous Once  . metoprolol succinate  200 mg Oral Daily  . mirtazapine  45 mg Oral QHS  . OLANZapine  10 mg Intramuscular BID AC  . polyethylene glycol  17 g Oral Daily  . senna  1 tablet Oral Daily  . traZODone  25 mg Oral TID   Continuous Infusions: . sodium chloride 50 mL/hr at 08/15/15 0559    Assessment/Plan:  1. Failure to thrive, dehydration, severe depression. ECT treatments Monday Wednesday Friday as per Dr. Toni Amend. Patient on IV fluids to maintain hydration status. Patient must be fed each meal. All other psychiatric medications as per psychiatry.  As per the aide and nursing staff he did eat a little bit more since yesterday when I fed him. Nursing staff trying to be more interactive with him. 2. Essential hypertension- blood pressure up and down. Continue usual medications. Low-dose lisinopril added 3. Type 2 diabetes- difficult to manage based on him not eating. Patient is on sliding scale at this point. 4. Hyperlipidemia unspecified on atorvastatin 5. History of gout on allopurinol  Code Status:     Code Status Orders        Start     Ordered   07/29/15 2027  Full code   Continuous     07/29/15 2028     Disposition Plan: To be determined  Consultants:  Psychiatry  Procedures:  ECT treatments  Time spent: 20 minutes  Alford Highland  [facility]

## 2015-08-15 NOTE — Anesthesia Postprocedure Evaluation (Signed)
  Anesthesia Post-op Note  Patient: Christian [name]  Procedure(s) Performed: * No procedures listed *  Anesthesia type:General  Patient location: PACU  Post pain: Pain level controlled  Post assessment: Post-op Vital signs reviewed, Patient's Cardiovascular Status Stable, Respiratory Function Stable, Patent Airway and No signs of Nausea or vomiting  Post vital signs: Reviewed and stable  Last Vitals:  Filed Vitals:   08/15/15 1302  BP: 137/90  Pulse: 92  Temp:   Resp: 16    Level of consciousness: awake, alert  and patient cooperative  Complications: No apparent anesthesia complications

## 2015-08-15 NOTE — Plan of Care (Signed)
Problem: Discharge Progression Outcomes Goal: Other Discharge Outcomes/Goals Outcome: Progressing Plan of care progress to goal for: 1. Discharge Plan:         No Discharge Plan at Current Time.            2. Pain:         Denies pain. 3. Hemodynamically Stable:         VSS.         Afebrile.         Remains on IVF's.         Encouraging Oral Intake.             4. Complications:         Sitter at Bedside 24:7.         Needs Repeated Encouragement with Tasks.          ECT M-W-F. 5. Diet:         2gm Sodium Diet. Increased Appetite. Tolerating Well.         Sitter Feeds.         Encouraging Oral Intake. 6. Activity:         OOB to Chair with PT and Sitter. Tolerates Well.

## 2015-08-15 NOTE — Care Management Important Message (Signed)
Important Message  Patient Details  Name: Christian [name] MRN: [id number] Date of Birth: 01/31/1951   Medicare Important Message Given:  Yes-third notification given    [name] 08/15/2015, 10:29 AM

## 2015-08-15 NOTE — Progress Notes (Signed)
Physical Therapy Treatment Patient Details Name: Christian [name] MRN: [id number] DOB: 10-Oct-1951 Today's Date: 08/15/2015    History of Present Illness Pt is a 64 year old male with bipolar disorder with recent suicide attempt and agitation and threats to wife. Pt transfered to this unit for IV access while he recieives ECT treatment.     PT Comments    Upon arrival to the room pt was sitting up in his bed awake and alert. Pt was more responsive to commands and more agreeable today than he had been in any previous session thus far. Pt was able to perform AROM of UE/LE without complaints and demonstrated good strength and ROM. AROM Flexion of the L shoulder is limited compared to the R but is able to be passively pushed to equal ROM by PT. Pt was still reluctant to perform any bed mobility (supine<>sit) or transfers (sit<> stand or bed<>chair) but was able to be persuaded to cooperate by giving him food as a reward for cooperating with commands. Pt demonstrated a good functional strength of his LEs with performance of sit to stand; only requiring min assist +2 for safe completion of transfer. Overall pt did well with therapy today and made good progress toward his goals. If pt's affect continues to improve next session should focus on progressing his functional mobility and should incorporate longer bouts of gait.   Follow Up Recommendations  SNF     Equipment Recommendations  Rolling walker with 5" wheels    Recommendations for Other Services       Precautions / Restrictions Precautions Precautions: Fall Restrictions Weight Bearing Restrictions: No    Mobility  Bed Mobility Overal bed mobility: Needs Assistance (+ 1 for bed mobility today, pt was able to come to sitting on EOB with mod assist +1. Supine to sidelying was performed with mod Independence as pt was able use bed rail to complete transition. ) Bed Mobility: Supine to Sit   Sidelying to sit:  (mod assist +2. One therapist  at shoulders and the other controlling the legs.) Supine to sit: Mod assist (mod assist +2. One therapist at shoulders and the other controlling the legs)     General bed mobility comments:  (Pt still reluctant to perform bed mobility tasks)  Transfers Overall transfer level: Needs assistance Equipment used: Rolling walker (2 wheeled) Transfers: Sit to/from Stand Sit to Stand: Min assist         General transfer comment: Pt reluctant to perform sit to stand transfer. Pt was able to complete transfer with min assist and VC for using RW for BUE when coming to standing. VC for proper hand placement on walker while in standing.   Ambulation/Gait Ambulation/Gait assistance: Min assist (min assist +2 for ambulation from bed to chair.Marland Kitchen ) Ambulation Distance (Feet): 5 Feet Assistive device: Rolling walker (2 wheeled)           Stairs            Wheelchair Mobility    Modified Rankin (Stroke Patients Only)       Balance Overall balance assessment: Needs assistance Sitting-balance support: Bilateral upper extremity supported   Sitting balance - Comments:  (Pt at times required mod assist +2 for sitting balance. Tended to lean toward his R side while sitting. Pt was cued to lean toward his L to get his balance at which point he was able to maintain sitting balance min assist +1. ) Postural control: Right lateral lean;Posterior lean Standing balance support: Bilateral  upper extremity supported (On RW) Standing balance-Leahy Scale: Fair                      Cognition Arousal/Alertness: Awake/alert Behavior During Therapy: WFL for tasks assessed/performed;Flat affect Overall Cognitive Status: History of cognitive impairments - at baseline (Pt was more awake today than he had been during any previous treatment session. Pt followed simple commands for AROM of UE/LE. Was initially resistant to any seated exercise or standing but was able to persuaded into cooperating with  food. )                      Exercises Total Joint Exercises Ankle Circles/Pumps: AROM;Both;15 reps;Supine Heel Slides: AROM;Both;10 reps;Supine Hip ABduction/ADduction: AROM;Both;10 reps;Supine Straight Leg Raises: AROM;Both;10 reps;Supine General Exercises - Upper Extremity Shoulder Flexion: AROM;Both;10 reps;PROM (R arm able to achieve full flexion ROM independently without therapist assist. Pt needed OP on L arm to achieve equal flexion ROM. ) Other Exercises Other Exercises: sit to stands (min assist +2) x 4, standing w/ BUE support on RW stationary steps forward and back x 10 bilat    General Comments        Pertinent Vitals/Pain Pain Assessment:  (pt did not make mention of pain this session)    Home Living                      Prior Function            PT Goals (current goals can now be found in the care plan section) Acute Rehab PT Goals Time For Goal Achievement: 07/24/15 Potential to Achieve Goals: Good Additional Goals Additional Goal #1: Assess and establish goals for OOB activities as appropriate and patient allows. Progress towards PT goals: Progressing toward goals    Frequency  Min 2X/week    PT Plan      Co-evaluation             End of Session Equipment Utilized During Treatment: Gait belt Activity Tolerance: Other (comment) (Pt was able to perform all that was asked of him today with some reluctancy. Tolerated treatment well overall and displayed good capacity for movement and good strength. ) Patient left: in chair;with nursing/sitter in room;with family/visitor present     Time: 1510-1534 PT Time Calculation (min) (ACUTE ONLY): 24 min  Charges:  $Therapeutic Exercise: 8-22 mins $Therapeutic Activity: 8-22 mins                    G Codes:      Georgina Peer 08/15/2015, 4:44 PM

## 2015-08-16 LAB — GLUCOSE, CAPILLARY
Glucose-Capillary: 144 mg/dL — ABNORMAL HIGH (ref 65–99)
Glucose-Capillary: 202 mg/dL — ABNORMAL HIGH (ref 65–99)
Glucose-Capillary: 196 mg/dL — ABNORMAL HIGH (ref 65–99)
Glucose-Capillary: 178 mg/dL — ABNORMAL HIGH (ref 65–99)

## 2015-08-16 MED ORDER — BETHANECHOL CHLORIDE 25 MG PO TABS
25.0000 mg | ORAL_TABLET | ORAL | Status: AC
  Administered 2015-08-16: 25 mg via ORAL
  Filled 2015-08-16: qty 1

## 2015-08-16 MED ORDER — TAMSULOSIN HCL 0.4 MG PO CAPS
0.4000 mg | ORAL_CAPSULE | Freq: Every day | ORAL | Status: DC
  Administered 2015-08-16 – 2015-08-26 (×11): 0.4 mg via ORAL
  Filled 2015-08-16 (×11): qty 1

## 2015-08-16 NOTE — Plan of Care (Signed)
Problem: Discharge Progression Outcomes Goal: Tolerating diet Outcome: Progressing Pt is alert and oriented x 4, irritable at times, non compliant with care at times, no bm throughout shift, on room, air, good appetite, IV fluids infusing, denies pain, sleeping in between care, no urine output throughout shift with full bladder as evidence by bladder scanner, MD notified and patietnt started on flomax and urecholine. IV fluids infusing, on scheduled trazodone.

## 2015-08-16 NOTE — Plan of Care (Signed)
Problem: Discharge Progression Outcomes Goal: Other Discharge Outcomes/Goals Outcome: Progressing Plan of Care Progress to Goal:   Pt birthday was today. Pt BS was high as pt had [employer] and pepsi for his birthday. Pt has been cooperative during shift and was participating in his care. Pt BP was 80s/50s, paged and spoke with Dr. Anne Hahn who ordered 1L NS to run over 2hr. Pt BP improved to 90s/40s. Pt was asymptomatic with low BP. No other signs of distress noted. Will continue to monitor.

## 2015-08-16 NOTE — Progress Notes (Signed)
Notified MD that patient has not urinated since 05:00 and bladder scan shows greater than 999 cc in bladder and refuses catheter, per MD give urecholine 25 mg stat and flomax 0.4 mg qhs, telephoen order via Dr. Renae Gloss

## 2015-08-16 NOTE — Progress Notes (Signed)
Patient ID: Christian [name], male   DOB: [birth year], 64 y.o.   MRN: [id number] [facility] PROGRESS NOTE  PCP: Leanor Rubenstein, MD  HPI/Subjective Patient turned over towards me when I came in the room. He stated he did not feel well but could not elaborate. He stated he ate his breakfast and fed himself and this was confirmed by the aide in the room.  Objective: Filed Vitals:   08/16/15 0800  BP: 133/82  Pulse: 92  Temp: 97.6 F (36.4 C)  Resp: 18    Filed Weights   08/07/15 0437 08/13/15 1003 08/15/15 1035  Weight: 92.534 kg (204 lb) 93.895 kg (207 lb) 93.895 kg (207 lb)    ROS: Review of Systems  Respiratory: Negative for cough and shortness of breath.   Cardiovascular: Negative for chest pain.  Gastrointestinal: Negative for nausea, vomiting and abdominal pain.  Neurological: Positive for weakness.  Psychiatric/Behavioral: Positive for depression.   Exam: Physical Exam  HENT:  Nose: No mucosal edema.  Mouth/Throat: No oropharyngeal exudate or posterior oropharyngeal edema.  Eyes: Conjunctivae are normal. Pupils are equal, round, and reactive to light.  Eyelids seem swollen today.  Neck: No JVD present. Carotid bruit is not present. No edema present. No thyroid mass and no thyromegaly present.  Cardiovascular: S1 normal and S2 normal.  Exam reveals no gallop.   No murmur heard. Pulses:      Dorsalis pedis pulses are 2+ on the right side, and 2+ on the left side.  Respiratory: No respiratory distress. He has no wheezes. He has no rhonchi. He has no rales.  GI: Soft. Bowel sounds are normal. There is no tenderness.  Musculoskeletal:       Right ankle: He exhibits swelling.       Left ankle: He exhibits swelling.  Lymphadenopathy:    He has no cervical adenopathy.  Neurological: He is alert.  Skin: Skin is warm. No rash noted. Nails show no clubbing.  Psychiatric: He exhibits a depressed mood.     CBG:  Recent Labs Lab 08/15/15 0610  08/15/15 0718 08/15/15 1608 08/15/15 2228 08/16/15 0739  GLUCAP 131* 130* 220* 333* 144*    Scheduled Meds: . allopurinol  300 mg Oral Daily  . atorvastatin  10 mg Oral Daily  . cholecalciferol  800 Units Oral Daily  . clonazePAM  1 mg Oral QHS  . colchicine  0.6 mg Oral Daily  . diltiazem  120 mg Oral Daily  . docusate sodium  200 mg Oral BID  . enoxaparin (LOVENOX) injection  40 mg Subcutaneous Q24H  . feeding supplement (GLUCERNA SHAKE)  237 mL Oral TID WC  . hydrALAZINE  50 mg Oral 4 times per day  . insulin aspart  0-9 Units Subcutaneous TID WC  . lisinopril  10 mg Oral Daily  . megestrol  400 mg Oral Daily  . metoCLOPramide (REGLAN) injection  10 mg Intravenous Once  . metoprolol succinate  200 mg Oral Daily  . mirtazapine  45 mg Oral QHS  . OLANZapine zydis  30 mg Oral QHS  . polyethylene glycol  17 g Oral Daily  . senna  1 tablet Oral Daily  . traZODone  25 mg Oral TID   Continuous Infusions: . sodium chloride 50 mL/hr at 08/16/15 0031    Assessment/Plan:  1. Failure to thrive, dehydration, severe depression. ECT treatments Monday Wednesday Friday as per Dr. Toni Amend. Patient on IV fluids to maintain hydration status. All other psychiatric medications as per psychiatry.  As per the aide and patient he ate breakfast today. Nursing staff trying to be more interactive with him. We'll get physical therapy evaluation. I will keep on the medical floor until he is eating more consistently. 2. Essential hypertension- blood pressure up and down. Continue usual medications. Low-dose lisinopril added. 3. Type 2 diabetes- difficult to manage based on him intermittently eating. Patient is on sliding scale at this point. 4. Hyperlipidemia unspecified on atorvastatin 5. History of gout on allopurinol  Code Status:     Code Status Orders        Start     Ordered   07/29/15 2027  Full code   Continuous     07/29/15 2028     Disposition Plan: To be  determined  Consultants:  Psychiatry  Procedures:  ECT treatments  Time spent: 20 minutes  Alford Highland  [facility] Hospitalists

## 2015-08-17 LAB — CREATININE, SERUM
Creatinine, Ser: 0.84 mg/dL (ref 0.61–1.24)
GFR calc Af Amer: >60 mL/min (ref >60)
GFR calc non Af Amer: >60 mL/min (ref >60)

## 2015-08-17 LAB — GLUCOSE, CAPILLARY
Glucose-Capillary: 225 mg/dL — ABNORMAL HIGH (ref 65–99)
Glucose-Capillary: 192 mg/dL — ABNORMAL HIGH (ref 65–99)
Glucose-Capillary: 188 mg/dL — ABNORMAL HIGH (ref 65–99)
Glucose-Capillary: 214 mg/dL — ABNORMAL HIGH (ref 65–99)

## 2015-08-17 MED ORDER — LIDOCAINE HCL (CARDIAC) 20 MG/ML IV SOLN
4.0000 mg | Freq: Once | INTRAVENOUS | Status: AC
  Administered 2015-08-18: 12:00:00 4 mg via INTRAVENOUS

## 2015-08-17 MED ORDER — KETAMINE HCL 10 MG/ML IJ SOLN
120.0000 mg | Freq: Once | INTRAMUSCULAR | Status: AC
  Administered 2015-08-18: 12:00:00 120 mg via INTRAVENOUS

## 2015-08-17 MED ORDER — LABETALOL HCL 5 MG/ML IV SOLN
20.0000 mg | Freq: Once | INTRAVENOUS | Status: AC
  Administered 2015-08-18: 12:00:00 20 mg via INTRAVENOUS

## 2015-08-17 MED ORDER — SUCCINYLCHOLINE CHLORIDE 20 MG/ML IJ SOLN
120.0000 mg | Freq: Once | INTRAMUSCULAR | Status: AC
Start: 2015-08-18 — End: 2015-08-18
  Administered 2015-08-18: 12:00:00 120 mg via INTRAVENOUS

## 2015-08-17 MED ORDER — BETHANECHOL CHLORIDE 25 MG PO TABS
25.0000 mg | ORAL_TABLET | ORAL | Status: AC
  Administered 2015-08-17: 18:00:00 25 mg via ORAL
  Filled 2015-08-17: qty 1

## 2015-08-17 MED ORDER — SODIUM CHLORIDE 0.9 % IV SOLN: 250.0000 mL | Freq: Once | INTRAVENOUS | Status: DC

## 2015-08-17 NOTE — Plan of Care (Signed)
Problem: Discharge Progression Outcomes Goal: Other Discharge Outcomes/Goals Outcome: Progressing Plan of Care Progress to Goal:   Pt is alert and is participating in care. Pt appetite has improved. No other signs of distress noted. Answered all questions asked by oncoming nurse.

## 2015-08-17 NOTE — Plan of Care (Signed)
Problem: Discharge Progression Outcomes Goal: Tolerating diet Outcome: Progressing Pt is alert and oriented x 4, no bm throughout shift, good appetite - ate all meals and snacks proivided, IV fluids infusing, denies pain, sleeping in between care, no urine output throughout shift with >999 in bladder.  MD order for Urecholine STAT.

## 2015-08-17 NOTE — Progress Notes (Signed)
Patient ID: Christian [name], male   DOB: 25-Feb-1951, 64 y.o.   MRN: [id number]  [facility] Physicians PROGRESS NOTE  PCP: Leanor Rubenstein, MD  HPI/Subjective Last night I was called that he has been holding onto his urine and not wanting to urinate. I told the patient that he must urinate every few hours. The patient states that he will try to urinate. The aide stated that he urinated well last night and has been using the urinal today. He ate breakfast this morning with help. The patient thinks that his mood is a little bit better.  Objective: Filed Vitals:   08/17/15 0441  BP: 132/88  Pulse: 85  Temp: 97.9 F (36.6 C)  Resp: 20    Filed Weights   08/07/15 0437 08/13/15 1003 08/15/15 1035  Weight: 92.534 kg (204 lb) 93.895 kg (207 lb) 93.895 kg (207 lb)    ROS: Review of Systems  Respiratory: Negative for cough and shortness of breath.   Cardiovascular: Negative for chest pain.  Gastrointestinal: Negative for nausea, vomiting and abdominal pain.  Neurological: Positive for weakness.  Psychiatric/Behavioral: Positive for depression.   Exam: Physical Exam  HENT:  Nose: No mucosal edema.  Mouth/Throat: No oropharyngeal exudate or posterior oropharyngeal edema.  Eyes: Conjunctivae are normal. Pupils are equal, round, and reactive to light.  Eyelids seem swollen today.  Neck: No JVD present. Carotid bruit is not present. No edema present. No thyroid mass and no thyromegaly present.  Cardiovascular: S1 normal and S2 normal.  Exam reveals no gallop.   No murmur heard. Pulses:      Dorsalis pedis pulses are 2+ on the right side, and 2+ on the left side.  Respiratory: No respiratory distress. He has no wheezes. He has no rhonchi. He has no rales.  GI: Soft. Bowel sounds are normal. There is no tenderness.  Musculoskeletal:       Right ankle: He exhibits swelling.       Left ankle: He exhibits swelling.  Lymphadenopathy:    He has no cervical adenopathy.  Neurological: He  is alert.  Skin: Skin is warm. No rash noted. Nails show no clubbing.  Psychiatric: He exhibits a depressed mood.     CBG:  Recent Labs Lab 08/16/15 1140 08/16/15 1642 08/16/15 2054 08/17/15 0722 08/17/15 1104  GLUCAP 202* 196* 178* 225* 192*    Scheduled Meds: . allopurinol  300 mg Oral Daily  . atorvastatin  10 mg Oral Daily  . cholecalciferol  800 Units Oral Daily  . clonazePAM  1 mg Oral QHS  . colchicine  0.6 mg Oral Daily  . diltiazem  120 mg Oral Daily  . docusate sodium  200 mg Oral BID  . enoxaparin (LOVENOX) injection  40 mg Subcutaneous Q24H  . feeding supplement (GLUCERNA SHAKE)  237 mL Oral TID WC  . hydrALAZINE  50 mg Oral 4 times per day  . insulin aspart  0-9 Units Subcutaneous TID WC  . lisinopril  10 mg Oral Daily  . megestrol  400 mg Oral Daily  . metoCLOPramide (REGLAN) injection  10 mg Intravenous Once  . metoprolol succinate  200 mg Oral Daily  . mirtazapine  45 mg Oral QHS  . OLANZapine zydis  30 mg Oral QHS  . polyethylene glycol  17 g Oral Daily  . senna  1 tablet Oral Daily  . tamsulosin  0.4 mg Oral QHS  . traZODone  25 mg Oral TID   Continuous Infusions: . sodium chloride 50  mL/hr at 08/16/15 2053    Assessment/Plan:  1. Failure to thrive, dehydration, severe depression. ECT treatments Monday Wednesday Friday as per Dr. Toni Amend. Patient on IV fluids to maintain hydration status. All other psychiatric medications as per psychiatry. Starting to see some improvement with his mood. He is eating a little bit more consistently at this point. 2. Essential hypertension- currently stable on usual medications 3. Type 2 diabetes- difficult to manage based on him intermittently eating. Patient is on sliding scale at this point. 4. Hyperlipidemia unspecified on atorvastatin 5. History of gout on allopurinol  Code Status:     Code Status Orders        Start     Ordered   07/29/15 2027  Full code   Continuous     07/29/15 2028      Disposition Plan: To be determined  Consultants:  Psychiatry  Procedures:  ECT treatments  Time spent: 20 minutes  Alford Highland  [facility] Hospitalists

## 2015-08-18 ENCOUNTER — Inpatient Hospital Stay: Payer: Commercial Managed Care - HMO | Admitting: Registered Nurse

## 2015-08-18 ENCOUNTER — Inpatient Hospital Stay: Payer: Commercial Managed Care - HMO

## 2015-08-18 ENCOUNTER — Other Ambulatory Visit: Payer: Self-pay | Admitting: *Deleted

## 2015-08-18 ENCOUNTER — Encounter: Payer: Self-pay | Admitting: *Deleted

## 2015-08-18 DIAGNOSIS — Z6828 Body mass index (BMI) 28.0-28.9, adult: Secondary | ICD-10-CM | POA: Diagnosis not present

## 2015-08-18 DIAGNOSIS — F315 Bipolar disorder, current episode depressed, severe, with psychotic features: Secondary | ICD-10-CM | POA: Diagnosis not present

## 2015-08-18 DIAGNOSIS — E669 Obesity, unspecified: Secondary | ICD-10-CM | POA: Diagnosis not present

## 2015-08-18 LAB — GLUCOSE, CAPILLARY
Glucose-Capillary: 137 mg/dL — ABNORMAL HIGH (ref 65–99)
Glucose-Capillary: 153 mg/dL — ABNORMAL HIGH (ref 65–99)
Glucose-Capillary: 265 mg/dL — ABNORMAL HIGH (ref 65–99)
Glucose-Capillary: 213 mg/dL — ABNORMAL HIGH (ref 65–99)

## 2015-08-18 MED ORDER — LABETALOL HCL 5 MG/ML IV SOLN
20.0000 mg | Freq: Once | INTRAVENOUS | Status: AC
  Administered 2015-08-18: 20 mg via INTRAVENOUS

## 2015-08-18 MED ORDER — FINASTERIDE 5 MG PO TABS
5.0000 mg | ORAL_TABLET | Freq: Every day | ORAL | Status: DC
  Administered 2015-08-18 – 2015-08-27 (×10): 5 mg via ORAL
  Filled 2015-08-18 (×10): qty 1

## 2015-08-18 MED ORDER — LACTULOSE 10 GM/15ML PO SOLN
30.0000 g | Freq: Every day | ORAL | Status: DC | PRN
  Administered 2015-08-21: 30 g via ORAL
  Filled 2015-08-18: qty 60

## 2015-08-18 NOTE — Anesthesia Preprocedure Evaluation (Signed)
Anesthesia Evaluation  Patient identified by MRN, date of birth, ID band Patient confused    History of Anesthesia Complications Negative for: history of anesthetic complications  Airway Mallampati: III       Dental no notable dental hx. (+) Poor Dentition   Pulmonary sleep apnea ,     + decreased breath sounds      Cardiovascular hypertension, Pt. on medications +CHF  Normal cardiovascular exam     Neuro/Psych PSYCHIATRIC DISORDERS (bipolar 1) negative neurological ROS     GI/Hepatic negative GI ROS, Neg liver ROS,   Endo/Other  diabetes, Type 1, Insulin Dependent  Renal/GU CRFRenal disease  negative genitourinary   Musculoskeletal negative musculoskeletal ROS (+)   Abdominal (+) + obese,   Peds  Hematology negative hematology ROS (+)   Anesthesia Other Findings   Reproductive/Obstetrics                             Anesthesia Physical  Anesthesia Plan  ASA: III  Anesthesia Plan: General   Post-op Pain Management:    Induction: Intravenous  Airway Management Planned: Mask  Additional Equipment:   Intra-op Plan:   Post-operative Plan:   Informed Consent: I have reviewed the patients History and Physical, chart, labs and discussed the procedure including the risks, benefits and alternatives for the proposed anesthesia with the patient or authorized representative who has indicated his/her understanding and acceptance.     Plan Discussed with: CRNA  Anesthesia Plan Comments:         Anesthesia Quick Evaluation  

## 2015-08-18 NOTE — Plan of Care (Signed)
Problem: Discharge Progression Outcomes Goal: Other Discharge Outcomes/Goals Plan of care progress to goal: Hemodynamically: VSS Complications: for ECT today Diet: good appetite on evenings, npo since MN Activity: one assist

## 2015-08-18 NOTE — Care Management (Addendum)
Scheduled for ECT today. Dr. Renae Gloss progress note indicates that Christian [name] may be having overflow incontinence.    Eating better. Gwenette Greet RN MSN Care Management [phone]

## 2015-08-18 NOTE — Procedures (Signed)
ECT SERVICES Physician's Interval Evaluation & Treatment Note  Patient Identification: ZANE SANJURJO MRN:  [id number] Date of Evaluation:  08/18/2015 TX #: 9  MADRS:   MMSE:   P.E. Findings:  No change to physical exam or findings  Psychiatric Interval Note:  Patient states that his mood is feeling better. He is more appropriate and pleasant in interaction. Energy level and interaction and motivation still very low  Subjective:  Patient is a 64 y.o. male seen for evaluation for Electroconvulsive Therapy. No subjective complaints  Treatment Summary:   []   Right Unilateral             [x]  Bilateral   % Energy : 1.0 ms, 100%   Impedance: 510 ohms  Seizure Energy Index: 632 V squared  Postictal Suppression Index: 28%  Seizure Concordance Index: 94%  Medications  Pre Shock: Xylocaine 4 mg, labetalol 20 mg, ketamine 120 mg, succinylcholine 120 mg  Post Shock: Labetalol 20 mg  Seizure Duration: 15 seconds by EMG, 22 seconds by EEG   Comments: Next treatment on Wednesday   Lungs:  [x]   Clear to auscultation               []  Other:   Heart:    [x]   Regular rhythm             []  irregular rhythm    [x]   Previous H&P reviewed, patient examined and there are NO CHANGES                 []   Previous H&P reviewed, patient examined and there are changes noted.   [name], MD 9/19/201612:08 PM

## 2015-08-18 NOTE — Anesthesia Procedure Notes (Signed)
Date/Time: 08/18/2015 12:16 PM Performed by: Stormy Fabian Pre-anesthesia Checklist: Patient identified, Emergency Drugs available, Suction available and Patient being monitored Patient Re-evaluated:Patient Re-evaluated prior to inductionOxygen Delivery Method: Circle system utilized Preoxygenation: Pre-oxygenation with 100% oxygen Intubation Type: IV induction Ventilation: Mask ventilation without difficulty and Mask ventilation throughout procedure Airway Equipment and Method: Bite block Placement Confirmation: positive ETCO2 Dental Injury: Teeth and Oropharynx as per pre-operative assessment

## 2015-08-18 NOTE — Progress Notes (Signed)
Physical Therapy Treatment Patient Details Name: SUSAN HOOLE MRN: [id number] DOB: 1951-08-10 Today's Date: 08/18/2015    History of Present Illness Pt is a 64 year old male with bipolar disorder with recent suicide attempt and agitation and threats to wife. Pt transfered to this unit for IV access while he recieives ECT treatment.     PT Comments    Patient status post ECT treatment this AM without adverse reaction documented/noted.  Resting with eyes closed upon arrival to session, but easily opens and responds to verbalization from therapist.  Initially, somewhat resistant to participation with session, but willing to complete with mod/max encouragement from therapist.  Continues to require min assist (+2 for safety) for all functional movement transitions and gait (15' x2) with RW, but does not physically resist therapist with any movement patterns this date.  Demonstrating slow, but steady, progress; will continue efforts as tolerated.   Follow Up Recommendations  SNF     Equipment Recommendations  Rolling walker with 5" wheels    Recommendations for Other Services       Precautions / Restrictions Precautions Precautions: Fall Restrictions Weight Bearing Restrictions: No    Mobility  Bed Mobility Overal bed mobility: Needs Assistance Bed Mobility: Supine to Sit     Supine to sit: Min assist     General bed mobility comments: assist to initiate movement and elevate trunk; multiple attempts, increased encouragement required to complete.  Patient, however, not physically resistant to movement transition this date.  Transfers Overall transfer level: Needs assistance Equipment used: Rolling walker (2 wheeled) Transfers: Sit to/from Stand Sit to Stand: Min assist         General transfer comment: pulls on RW with bilat UEs during all sit/stand transfers, but able to complete with less assist from therapist than required during previous  sessions  Ambulation/Gait Ambulation/Gait assistance: Min assist;+2 physical assistance Ambulation Distance (Feet): 15 Feet Assistive device: Rolling walker (2 wheeled)       General Gait Details: 63' x2 with RW, min assist +2 for safety (with chair follow).  Narrowed BOS with slow, effortful cadence and overall gait performance.  Limited balance reactions, but no overt LOB displayed.  Spontaneously attempting stand to sit transition when chair is in sight; max encouragement for redirection and completion of gait distance.   Stairs            Wheelchair Mobility    Modified Rankin (Stroke Patients Only)       Balance Overall balance assessment: Needs assistance Sitting-balance support: No upper extremity supported;Feet supported Sitting balance-Leahy Scale: Fair     Standing balance support: Bilateral upper extremity supported Standing balance-Leahy Scale: Fair                      Cognition Arousal/Alertness: Awake/alert Behavior During Therapy: Flat affect                        Exercises Other Exercises Other Exercises: Sit/stand x3 with RW, min assist +1--pulls on RW with bilat UEs despite cuing.  Less assist, less encouragement required this session.    General Comments        Pertinent Vitals/Pain Pain Assessment: No/denies pain    Home Living                      Prior Function            PT Goals (current goals can now be  found in the care plan section) Acute Rehab PT Goals Patient Stated Goal: patient unable to verbalize PT Goal Formulation: With patient Time For Goal Achievement: 07/24/15 Potential to Achieve Goals: Good Progress towards PT goals: Progressing toward goals    Frequency  Min 2X/week    PT Plan Current plan remains appropriate    Co-evaluation             End of Session Equipment Utilized During Treatment: Gait belt Activity Tolerance: Patient tolerated treatment well Patient left: in  chair;with call bell/phone within reach;with nursing/sitter in room     Time: 1511-1526 PT Time Calculation (min) (ACUTE ONLY): 15 min  Charges:  $Gait Training: 8-22 mins                    G Codes:      Alyan Hartline H. Manson Passey, PT, DPT, NCS 08/18/2015, 3:50 PM [phone]

## 2015-08-18 NOTE — Consult Note (Signed)
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 Pressure ulcer [L89.90] 07/30/2015  . Protein-calorie malnutrition, severe [E43] 07/30/2015  . Dehydration [E86.0] 07/29/2015  . Bipolar disorder, current episode depressed, severe, with psychotic features [F31.5] 07/09/2015  . Diabetes [E11.9] 07/09/2015  . UTI (urinary tract infection) [N39.0] 06/19/2014  . Positive blood culture [R78.81] 06/19/2014  . Bipolar affective disorder, current episode manic with psychotic symptoms [F31.2] 06/17/2014  . Type II or unspecified type diabetes mellitus with unspecified complication, uncontrolled [E11.8] 06/02/2014  . Other and unspecified hyperlipidemia [E78.5] 06/02/2014  . OSA on CPAP [G47.33] 06/02/2014  . 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 Gout [M10.9] 06/02/2014  . Hypertension [I10]     Total Time spent with patient: 30 minutes  Subjective:   Lee Kuang Younkin is a 64 y.o. male patient admitted with "I'm all right". He actually said that to me.Marland Kitchen  HPI:  Follow-up Monday the 19th. Patient once again had ECT this morning. This afternoon he tells me that he is actually feeling "alright". Once again he has no complaints. He is still very physically withdrawn and fatigued. Doesn't interact very much. His affect and thinking however clearly much improved from where they were. Tolerating treatment well HPI Elements:   Quality:  depression withdrawal psychosis delusions. Severity:  severe and life-threatening with starvation and suicidal  ideation. Timing:  still ongoing and has been here for a couple months. Duration:  ongoing. Context:  history of severe bipolar disorder.  Past Medical History:  Past Medical History  Diagnosis Date  . Mood swings   . Type II or unspecified type diabetes mellitus with unspecified complication, uncontrolled 06/02/2014  . Other and unspecified hyperlipidemia 06/02/2014  . OSA on CPAP 06/02/2014  . 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 Bipolar disorder   . CHF (congestive heart failure)   . CKD (chronic kidney disease), stage III   . Diabetes mellitus without complication   . Hypertension   . Gout   . Bipolar 1 disorder   . OSA on CPAP    History reviewed. No pertinent past surgical history. Family History:  Family History  Problem Relation Age of Onset  . Dementia Mother   . Arthritis Father   . Heart failure Neg Hx   . Kidney failure Neg Hx   . Cancer Neg Hx    Social History:  History  Alcohol Use No     History  Drug Use No    Social History   Social History  . Marital Status: Married    Spouse Name: N/A  . Number of Children: N/A  . Years of Education: N/A   Social History Main Topics  . Smoking status: Never Smoker   . Smokeless tobacco: None  . Alcohol Use: No  . Drug Use: No  . Sexual Activity: Not Currently   Other Topics Concern  . None   Social History Narrative   ** Merged History Encounter **       Additional Social History:  Allergies:  No Known Allergies  Labs:  Results for orders placed or performed during the hospital encounter of 07/29/15 (from the past 48 hour(s))  Creatinine, serum     Status: None   Collection Time: 08/17/15  5:59 AM  Result Value Ref Range   Creatinine, Ser 0.84 0.61 - 1.24 mg/dL   GFR calc non Af Amer >60 >60 mL/min   GFR calc Af Amer >60 >60 mL/min    Comment: (NOTE) The eGFR has been calculated using the CKD EPI equation. This  calculation has not been validated in all clinical situations. eGFR's persistently <60 mL/min signify possible Chronic Kidney Disease.   Glucose, capillary     Status: Abnormal   Collection Time: 08/17/15  7:22 AM  Result Value Ref Range   Glucose-Capillary 225 (H) 65 - 99 mg/dL   Comment 1 Notify RN   Glucose, capillary     Status: Abnormal   Collection Time: 08/17/15 11:04 AM  Result Value Ref Range   Glucose-Capillary 192 (H) 65 - 99 mg/dL   Comment 1 Notify RN   Glucose, capillary     Status: Abnormal   Collection Time: 08/17/15  4:16 PM  Result Value Ref Range   Glucose-Capillary 188 (H) 65 - 99 mg/dL   Comment 1 Notify RN   Glucose, capillary     Status: Abnormal   Collection Time: 08/17/15  9:06 PM  Result Value Ref Range   Glucose-Capillary 214 (H) 65 - 99 mg/dL  Glucose, capillary     Status: Abnormal   Collection Time: 08/18/15  7:40 AM  Result Value Ref Range   Glucose-Capillary 137 (H) 65 - 99 mg/dL  Glucose, capillary     Status: Abnormal   Collection Time: 08/18/15  1:35 PM  Result Value Ref Range   Glucose-Capillary 153 (H) 65 - 99 mg/dL  Glucose, capillary     Status: Abnormal   Collection Time: 08/18/15  4:05 PM  Result Value Ref Range   Glucose-Capillary 265 (H) 65 - 99 mg/dL   Comment 1 Notify RN   Glucose, capillary     Status: Abnormal   Collection Time: 08/18/15  9:37 PM  Result Value Ref Range   Glucose-Capillary 213 (H) 65 - 99 mg/dL    Vitals: Blood pressure 152/79, pulse 100, temperature 99 F (37.2 C), temperature source Oral, resp. rate 20, height 6' (1.829 m), weight 93.895 kg (207 lb), SpO2 100 %.  Risk to Self: Is patient at risk for suicide?: Yes Risk to Others:   Prior Inpatient Therapy:   Prior Outpatient Therapy:    Current Facility-Administered Medications  Medication Dose Route Frequency Provider Last Rate Last Dose  . 0.9 %  sodium chloride infusion   Intravenous Continuous Epifanio Lesches, MD 50 mL/hr at 08/17/15 1743     . acetaminophen (TYLENOL) tablet 650 mg  650 mg Oral Q6H PRN [name], MD   650 mg at 07/31/15 4970   Or  . acetaminophen (TYLENOL) suppository 650 mg  650 mg Rectal Q6H PRN [name], MD      . allopurinol (ZYLOPRIM) tablet 300 mg  300 mg Oral Daily [name], MD   300 mg at 08/17/15 1007  . atorvastatin (LIPITOR) tablet 10 mg  10 mg Oral Daily [name], MD   10 mg at 08/17/15 1007  . cholecalciferol (VITAMIN D) tablet 800 Units  800 Units Oral Daily [name], MD   800 Units at 08/17/15 1007  .  clonazePAM (KLONOPIN) tablet 1 mg  1 mg Oral QHS [name], MD   1 mg at 08/18/15 2034  . colchicine tablet 0.6 mg  0.6 mg Oral Daily [name], MD   0.6 mg at 08/17/15 1007  . diltiazem (CARDIZEM CD) 24 hr capsule 120 mg  120 mg Oral Daily Vipul Shah, MD   120 mg at 08/17/15 1007  . docusate sodium (COLACE) capsule 200 mg  200 mg Oral BID Dustin Flock, MD   200 mg at 08/18/15 2037  . enoxaparin (LOVENOX) injection 40 mg  40 mg Subcutaneous Q24H [name], MD   40 mg at 08/18/15 2032  . feeding supplement (GLUCERNA SHAKE) (GLUCERNA SHAKE) liquid 237 mL  237 mL Oral TID WC [name], MD   237 mL at 08/18/15 1700  . finasteride (PROSCAR) tablet 5 mg  5 mg Oral Daily Loletha Grayer, MD   5 mg at 08/18/15 1335  . hydrALAZINE (APRESOLINE) tablet 50 mg  50 mg Oral 4 times per day Epifanio Lesches, MD   50 mg at 08/18/15 2034  . insulin aspart (novoLOG) injection 0-9 Units  0-9 Units Subcutaneous TID WC Dustin Flock, MD   5 Units at 08/18/15 1619  . lactulose (CHRONULAC) 10 GM/15ML solution 30 g  30 g Oral Daily PRN Loletha Grayer, MD      . lisinopril (PRINIVIL,ZESTRIL) tablet 10 mg  10 mg Oral Daily Loletha Grayer, MD   10 mg at 08/18/15 1336  . megestrol (MEGACE) 400 MG/10ML suspension 400 mg  400 mg Oral Daily [name], MD   400 mg at 08/17/15 1006  . metoCLOPramide (REGLAN) injection 10 mg  10 mg Intravenous Once Gonzella Lex, MD    10 mg at 08/01/15 1300  . metoprolol succinate (TOPROL-XL) 24 hr tablet 200 mg  200 mg Oral Daily [name], MD   200 mg at 08/18/15 0938  . mirtazapine (REMERON) tablet 45 mg  45 mg Oral QHS Gonzella Lex, MD   45 mg at 08/18/15 2033  . OLANZapine zydis (ZYPREXA) disintegrating tablet 30 mg  30 mg Oral QHS Gonzella Lex, MD   30 mg at 08/18/15 2041  . ondansetron (ZOFRAN) tablet 4 mg  4 mg Oral Q6H PRN [name], MD       Or  . ondansetron (ZOFRAN) injection 4 mg  4 mg Intravenous Q6H PRN [name], MD      . polyethylene glycol (MIRALAX / GLYCOLAX) packet 17 g  17 g Oral Daily Epifanio Lesches, MD   17 g at 08/18/15 1342  . senna (SENOKOT) tablet 8.6 mg  1 tablet Oral Daily Dustin Flock, MD   8.6 mg at 08/17/15 1007  . sodium chloride 0.9 % bolus 1,000 mL  1,000 mL Intravenous PRN Lance Coon, MD   1,000 mL at 08/15/15 2218  . tamsulosin (FLOMAX) capsule 0.4 mg  0.4 mg Oral QHS Loletha Grayer, MD   0.4 mg at 08/18/15 2037  . traZODone (DESYREL) tablet 25 mg  25 mg Oral TID [name], MD   25 mg at 08/18/15 2034    Musculoskeletal: Strength & Muscle Tone: decreased Gait & Station: unable to stand Patient leans: N/A  Psychiatric Specialty Exam: Physical Exam  Nursing note and vitals reviewed. Constitutional: He appears well-developed and well-nourished.  HENT:  Head: Normocephalic and atraumatic.  Eyes: Conjunctivae are normal. Pupils are equal, round, and reactive to light.  Neck: Normal  range of motion.  Cardiovascular: Normal heart sounds.   Respiratory: Effort normal.  GI: Soft.  Musculoskeletal: Normal range of motion.  Neurological: He is alert.  Skin: Skin is warm and dry.  Psychiatric: His affect is blunt. His speech is delayed. He is slowed and withdrawn. He expresses impulsivity. He does not exhibit a depressed mood. He exhibits abnormal recent memory.    Review of Systems  Constitutional: Positive for malaise/fatigue.  HENT: Negative.    Eyes: Negative.   Respiratory: Negative.   Cardiovascular: Negative.   Gastrointestinal: Negative.   Musculoskeletal: Negative.   Skin: Negative.   Neurological: Positive for weakness.  Psychiatric/Behavioral: Positive for depression and memory loss. Negative for suicidal ideas, hallucinations and substance abuse. The patient is nervous/anxious and has insomnia.     Blood pressure 152/79, pulse 100, temperature 99 F (37.2 C), temperature source Oral, resp. rate 20, height 6' (1.829 m), weight 93.895 kg (207 lb), SpO2 100 %.Body mass index is 28.07 kg/(m^2).  General Appearance: Disheveled  Eye Sport and exercise psychologist::  Fair  Speech:  Garbled and Slow  Volume:  Decreased  Mood:  Euthymic  Affect:  Depressed  Thought Process:  Circumstantial  Orientation:  Full (Time, Place, and Person)  Thought Content:  Delusions  Suicidal Thoughts:  No  Homicidal Thoughts:  No  Memory:  Immediate;   Fair Recent;   Fair Remote;   Fair  Judgement:  Impaired  Insight:  Shallow  Psychomotor Activity:  Decreased  Concentration:  Poor  Recall:  Poor  Fund of Knowledge:Fair  Language: Fair  Akathisia:  No  Handed:  Right  AIMS (if indicated):     Assets:  Financial Resources/Insurance Housing Intimacy Resilience Social Support  ADL's:  Impaired  Cognition: Impaired,  Mild  Sleep:      Medical Decision Making: Established Problem, Stable/Improving (1), Review of Psycho-Social Stressors (1), Review or order clinical lab tests (1), Review and summation of old records (2) and Review of Medication Regimen & Side Effects (2)  Treatment Plan Summary: Medication management and Plan Continue bilateral ECT 3 times a week. Patient is now actively agreeable. Discussed with him the importance of continuing to try to be interactive with physical therapy. Continue current medication doses.  Plan:  Recommend psychiatric Inpatient admission when medically cleared. Supportive therapy provided about ongoing  stressors. Disposition: continue on medicine ward for now. ECT 3 times a week. Continue current medicine and monitoring. I'm going to increase his dose of Zyprexa to 20 mg at night  [place] 08/18/2015 9:54 PM

## 2015-08-18 NOTE — Care Management Important Message (Signed)
Important Message  Patient Details  Name: Christian [name] MRN: [id number] Date of Birth: Jan 12, 1951   Medicare Important Message Given:  Christian [name] notification given    Christian [name] 08/18/2015, 10:33 AM

## 2015-08-18 NOTE — Plan of Care (Signed)
Problem: Discharge Progression Outcomes Goal: Other Discharge Outcomes/Goals Outcome: Progressing Plan of care progress to goal: Pt had ECT done today. Standing up to void in urinal. Pt needs lots of encouragement. Up in chair this afternoon. Ate well this afternoon. No c/o pain.  1:1 Sitter remains at bedside.

## 2015-08-18 NOTE — Progress Notes (Signed)
Patient ID: Christian [name], male   DOB: 1951-08-08, 64 y.o.   MRN: [id number]  [facility] PROGRESS NOTE  PCP: Leanor Rubenstein, MD  HPI/Subjective I get called every evening for the patient not urinating and having a lot of urine in his bladder. I give him a dose or Urecholine and that helps him urinate. Likely an overflow incontinence. Patient feels okay and offers no complaints.  Objective: Filed Vitals:   08/18/15 0509  BP: 147/83  Pulse: 79  Temp: 98 F (36.7 C)  Resp: 20    Filed Weights   08/07/15 0437 08/13/15 1003 08/15/15 1035  Weight: 92.534 kg (204 lb) 93.895 kg (207 lb) 93.895 kg (207 lb)    ROS: Review of Systems  Respiratory: Negative for cough and shortness of breath.   Cardiovascular: Negative for chest pain.  Gastrointestinal: Positive for constipation. Negative for nausea, vomiting and abdominal pain.  Musculoskeletal: Negative for joint pain.  Neurological: Positive for weakness.  Psychiatric/Behavioral: Positive for depression.   Exam: Physical Exam  HENT:  Nose: No mucosal edema.  Mouth/Throat: No oropharyngeal exudate or posterior oropharyngeal edema.  Eyes: Conjunctivae are normal. Pupils are equal, round, and reactive to light.  Eyelids seem swollen today.  Neck: No JVD present. Carotid bruit is not present. No edema present. No thyroid mass and no thyromegaly present.  Cardiovascular: S1 normal and S2 normal.  Exam reveals no gallop.   No murmur heard. Pulses:      Dorsalis pedis pulses are 2+ on the right side, and 2+ on the left side.  Respiratory: No respiratory distress. He has no wheezes. He has no rhonchi. He has no rales.  GI: Soft. Bowel sounds are normal. There is no tenderness.  Musculoskeletal:       Right ankle: He exhibits swelling.       Left ankle: He exhibits swelling.  Lymphadenopathy:    He has no cervical adenopathy.  Neurological: He is alert.  Skin: Skin is warm. No rash noted. Nails show no clubbing.   Psychiatric: He exhibits a depressed mood.     CBG:  Recent Labs Lab 08/17/15 0722 08/17/15 1104 08/17/15 1616 08/17/15 2106 08/18/15 0740  GLUCAP 225* 192* 188* 214* 137*    Scheduled Meds: . sodium chloride  250 mL Intravenous Once  . allopurinol  300 mg Oral Daily  . atorvastatin  10 mg Oral Daily  . cholecalciferol  800 Units Oral Daily  . clonazePAM  1 mg Oral QHS  . colchicine  0.6 mg Oral Daily  . diltiazem  120 mg Oral Daily  . docusate sodium  200 mg Oral BID  . enoxaparin (LOVENOX) injection  40 mg Subcutaneous Q24H  . feeding supplement (GLUCERNA SHAKE)  237 mL Oral TID WC  . finasteride  5 mg Oral Daily  . hydrALAZINE  50 mg Oral 4 times per day  . insulin aspart  0-9 Units Subcutaneous TID WC  . ketamine  120 mg Intravenous Once  . labetalol  20 mg Intravenous Once  . lidocaine (cardiac) 100 mg/50ml  4 mg Intravenous Once  . lisinopril  10 mg Oral Daily  . megestrol  400 mg Oral Daily  . metoCLOPramide (REGLAN) injection  10 mg Intravenous Once  . metoprolol succinate  200 mg Oral Daily  . mirtazapine  45 mg Oral QHS  . OLANZapine zydis  30 mg Oral QHS  . polyethylene glycol  17 g Oral Daily  . senna  1 tablet Oral Daily  .  succinylcholine  120 mg Intravenous Once  . tamsulosin  0.4 mg Oral QHS  . traZODone  25 mg Oral TID   Continuous Infusions: . sodium chloride 50 mL/hr at 08/17/15 1743    Assessment/Plan:  1. Failure to thrive, dehydration, severe depression. ECT treatments Monday Wednesday Friday as per Dr. Toni Amend. Patient on IV fluids to maintain hydration status. All other psychiatric medications as per psychiatry. Starting to see some improvement with his mood. He is eating a little bit more consistently at this point. 2. Essential hypertension- currently stable on usual medications 3. Type 2 diabetes- difficult to manage based on him intermittently eating. Patient is on sliding scale at this point. 4. Hyperlipidemia unspecified on  atorvastatin 5. History of gout on allopurinol 6. Overflow incontinence- I asked the nursing staff to stand him up every 4 hours with the urinal to have him urinate. With the ECT treatment ends up losing a lot of his urine so a condom catheter is needed. I'll be giving Urecholine as needed in the evenings. I have him on Flomax and I will add finasteride for potential BPH. 7. Constipation- add MiraLAX.  Code Status:     Code Status Orders        Start     Ordered   07/29/15 2027  Full code   Continuous     07/29/15 2028     Disposition Plan: To be determined  Consultants:  Psychiatry  Procedures:  ECT treatments  Time spent: 20 minutes  Alford Highland  [facility]

## 2015-08-18 NOTE — Transfer of Care (Signed)
Immediate Anesthesia Transfer of Care Note  Patient: Christian [name]  Procedure(s) Performed: * No procedures listed *  Patient Location: PACU  Anesthesia Type:General  Level of Consciousness: sedated  Airway & Oxygen Therapy: Patient Spontanous Breathing and Patient connected to face mask oxygen  Post-op Assessment: Report given to RN and Post -op Vital signs reviewed and stable  Post vital signs: Reviewed and stable  Last Vitals:  Filed Vitals:   08/18/15 1235  BP: 168/99  Pulse:   Temp: 38.3 C  Resp: 18    Complications: No apparent anesthesia complications

## 2015-08-19 LAB — CREATININE, SERUM
Creatinine, Ser: 0.84 mg/dL (ref 0.61–1.24)
GFR calc Af Amer: >60 mL/min (ref >60)
GFR calc non Af Amer: >60 mL/min (ref >60)

## 2015-08-19 LAB — GLUCOSE, CAPILLARY
Glucose-Capillary: 141 mg/dL — ABNORMAL HIGH (ref 65–99)
Glucose-Capillary: 136 mg/dL — ABNORMAL HIGH (ref 65–99)
Glucose-Capillary: 317 mg/dL — ABNORMAL HIGH (ref 65–99)
Glucose-Capillary: 217 mg/dL — ABNORMAL HIGH (ref 65–99)

## 2015-08-19 MED ORDER — KETAMINE HCL 10 MG/ML IJ SOLN
120.0000 mg | Freq: Once | INTRAMUSCULAR | Status: AC
Start: 2015-08-20 — End: 2015-08-20
  Administered 2015-08-20: 12:00:00 120 mg via INTRAVENOUS

## 2015-08-19 MED ORDER — SUCCINYLCHOLINE CHLORIDE 20 MG/ML IJ SOLN
120.0000 mg | Freq: Once | INTRAMUSCULAR | Status: AC
Start: 2015-08-20 — End: 2015-08-20
  Administered 2015-08-20: 120 mg via INTRAVENOUS

## 2015-08-19 MED ORDER — LIDOCAINE HCL (CARDIAC) 20 MG/ML IV SOLN: 4.0000 mg | Freq: Once | INTRAVENOUS | Status: DC

## 2015-08-19 MED ORDER — SODIUM CHLORIDE 0.9 % IV SOLN: 250.0000 mL | Freq: Once | INTRAVENOUS | Status: DC

## 2015-08-19 MED ORDER — LABETALOL HCL 5 MG/ML IV SOLN
20.0000 mg | Freq: Once | INTRAVENOUS | Status: AC
  Administered 2015-08-20: 20 mg via INTRAVENOUS

## 2015-08-19 NOTE — Progress Notes (Signed)
Patient ID: Christian [name], male   DOB: [birth year], 65 y.o.   MRN: [id number]  [facility] Physicians PROGRESS NOTE  PCP: Leanor Rubenstein, MD  HPI/Subjective As per the aide, the patient sat up and ate his breakfast today. The patient states that his mood is okay. The patient urinated this morning. He offers no physical complaints. The patient is passing gas. As per the aide yesterday he walked to the door with physical therapy.  Objective: Filed Vitals:   08/19/15 1129  BP: 123/79  Pulse: 107  Temp: 98.9 F (37.2 C)  Resp: 20    Filed Weights   08/13/15 1003 08/15/15 1035 08/18/15 0929  Weight: 93.895 kg (207 lb) 93.895 kg (207 lb) 93.895 kg (207 lb)    ROS: Review of Systems  Respiratory: Negative for cough and shortness of breath.   Cardiovascular: Negative for chest pain.  Gastrointestinal: Positive for constipation. Negative for nausea, vomiting and abdominal pain.  Musculoskeletal: Negative for joint pain.  Neurological: Positive for weakness.  Psychiatric/Behavioral: Positive for depression.   Exam: Physical Exam  HENT:  Nose: No mucosal edema.  Mouth/Throat: No oropharyngeal exudate or posterior oropharyngeal edema.  Eyes: Conjunctivae are normal. Pupils are equal, round, and reactive to light.  Eyelids seem swollen today.  Neck: No JVD present. Carotid bruit is not present. No edema present. No thyroid mass and no thyromegaly present.  Cardiovascular: S1 normal and S2 normal.  Exam reveals no gallop.   No murmur heard. Pulses:      Dorsalis pedis pulses are 2+ on the right side, and 2+ on the left side.  Respiratory: No respiratory distress. He has no wheezes. He has no rhonchi. He has no rales.  GI: Soft. Bowel sounds are normal. There is no tenderness.  Musculoskeletal:       Right ankle: He exhibits swelling.       Left ankle: He exhibits swelling.  Lymphadenopathy:    He has no cervical adenopathy.  Neurological: He is alert.  Skin: Skin is warm. No  rash noted. Nails show no clubbing.  Psychiatric: He exhibits a depressed mood.     CBG:  Recent Labs Lab 08/18/15 1335 08/18/15 1605 08/18/15 2137 08/19/15 0728 08/19/15 1114  GLUCAP 153* 265* 213* 136* 317*    Scheduled Meds: . allopurinol  300 mg Oral Daily  . atorvastatin  10 mg Oral Daily  . cholecalciferol  800 Units Oral Daily  . clonazePAM  1 mg Oral QHS  . colchicine  0.6 mg Oral Daily  . diltiazem  120 mg Oral Daily  . docusate sodium  200 mg Oral BID  . enoxaparin (LOVENOX) injection  40 mg Subcutaneous Q24H  . feeding supplement (GLUCERNA SHAKE)  237 mL Oral TID WC  . finasteride  5 mg Oral Daily  . hydrALAZINE  50 mg Oral 4 times per day  . insulin aspart  0-9 Units Subcutaneous TID WC  . lisinopril  10 mg Oral Daily  . megestrol  400 mg Oral Daily  . metoCLOPramide (REGLAN) injection  10 mg Intravenous Once  . metoprolol succinate  200 mg Oral Daily  . mirtazapine  45 mg Oral QHS  . OLANZapine zydis  30 mg Oral QHS  . polyethylene glycol  17 g Oral Daily  . senna  1 tablet Oral Daily  . tamsulosin  0.4 mg Oral QHS  . traZODone  25 mg Oral TID   Continuous Infusions: . sodium chloride 50 mL/hr at 08/19/15 [phone]  Assessment/Plan:  1. Failure to thrive, dehydration, severe depression. ECT treatments Monday Wednesday Friday as per Dr. Toni Amend. Patient on IV fluids to maintain hydration status. All other psychiatric medications as per psychiatry. Starting to see some improvement with his mood. He ate a good breakfast this morning. 2. Essential hypertension- currently stable on usual medications 3. Type 2 diabetes- difficult to manage based on him intermittently eating. Patient is on sliding scale at this point. 4. Hyperlipidemia unspecified on atorvastatin 5. History of gout on allopurinol 6. Overflow incontinence- I asked the nursing staff to stand him up every 4 hours with the urinal to have him urinate. With the ECT treatment ends up losing a lot of  his urine so a condom catheter is needed.  7. BPH- Flomax and finasteride 8. Constipation-  MiraLAX. Senna. When necessary lactulose if does not have a bowel movement today.  Code Status:     Code Status Orders        Start     Ordered   07/29/15 2027  Full code   Continuous     07/29/15 2028     Disposition Plan: To be determined  Consultants:  Psychiatry  Procedures:  ECT treatments  Time spent: 20 minutes  Alford Highland  [facility]

## 2015-08-19 NOTE — Plan of Care (Signed)
Problem: Discharge Progression Outcomes Goal: Other Discharge Outcomes/Goals Outcome: Progressing Plan of care progress to goal: VSS Pt receives ECT on MON/WED/FRI Encouraged to void in urinal standing Q4H PICC RUE, blood draws and iv fluids PT working with

## 2015-08-19 NOTE — Progress Notes (Signed)
Clinical Child psychotherapist (CSW) discussed case with Amy [facility] Case Manager. Disposition at this time is for patient to transfer to BMU when medically clear. CSW will continue to follow and assist as needed.   Jetta Lout, LCSWA [phone]

## 2015-08-19 NOTE — Progress Notes (Signed)
Inpatient Diabetes Program Recommendations  AACE/ADA: New Consensus Statement on Inpatient Glycemic Control (2015)  Target Ranges:  Prepandial:   less than 140 mg/dL      Peak postprandial:   less than 180 mg/dL (1-2 hours)      Critically ill patients:  140 - 180 mg/dL   Results for Christian [name], Christian [name] (MRN [id number]) as of 08/19/2015 10:14  Ref. Range 08/18/2015 12:53 08/18/2015 13:35 08/18/2015 16:05 08/18/2015 21:37 08/19/2015 07:28  Glucose-Capillary Latest Ref Range: 65-99 mg/dL 628 (H) 366 (H) 294 (H) 213 (H) 136 (H)   Review of Glycemic Control  Diabetes history: Type 2, A1C 7.5% on 07/11/15 Outpatient Diabetes medications: Glucotrol 5mg  qday Current orders for Inpatient glycemic control: Novolog 0-9 units tid with meals  Inpatient Diabetes Program Recommendations: Agree with current orders for diabetes control. If blood sugars become elevated this afternoon, like they did yesterday, consider increasing Novolog to moderate correction scale 0-15 units tid.  Susette Racer, RN, BA, MHA, CDE Diabetes Coordinator Inpatient Diabetes Program  [phone] (Team Pager) [phone] [facility] Office) 08/19/2015 10:21 AM

## 2015-08-19 NOTE — Plan of Care (Signed)
Problem: Discharge Progression Outcomes Goal: Other Discharge Outcomes/Goals Outcome: Progressing Patient has no complaints of pain. VSS. IV fluids infusing. Tolerating diet, no complaints of nausea. Sitter at bedside. ECT scheduled for tomorrow.

## 2015-08-19 NOTE — Consult Note (Signed)
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 Pressure ulcer [L89.90] 07/30/2015  . Protein-calorie malnutrition, severe [E43] 07/30/2015  . Dehydration [E86.0] 07/29/2015  . Bipolar disorder, current episode depressed, severe, with psychotic features [F31.5] 07/09/2015  . Diabetes [E11.9] 07/09/2015  . UTI (urinary tract infection) [N39.0] 06/19/2014  . Positive blood culture [R78.81] 06/19/2014  . Bipolar affective disorder, current episode manic with psychotic symptoms [F31.2] 06/17/2014  . Type II or unspecified type diabetes mellitus with unspecified complication, uncontrolled [E11.8] 06/02/2014  . Other and unspecified hyperlipidemia [E78.5] 06/02/2014  . OSA on CPAP [G47.33] 06/02/2014  . 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 Gout [M10.9] 06/02/2014  . Hypertension [I10]     Total Time spent with patient: 30 minutes  Subjective:   Christian [name] is a 64 y.o. male patient admitted with "I'm all right". He actually said that to me.Marland Kitchen  HPI:  Follow-up Tuesday the 20th. Patient continues to improve. He was awake this evening. Actually made fairly pleasant conversation. Seems to be doing some cooperating with physical therapy. No change to medicine. Next ECT treatment tomorrow. I have confirmed with the psychiatry staff that we still cannot have him downstairs with his current PICC line in place.HPI Elements:   Quality:  depression withdrawal psychosis delusions. Severity:  severe and life-threatening with starvation and  suicidal ideation. Timing:  still ongoing and has been here for a couple months. Duration:  ongoing. Context:  history of severe bipolar disorder.  Past Medical History:  Past Medical History  Diagnosis Date  . Mood swings   . Type II or unspecified type diabetes mellitus with unspecified complication, uncontrolled 06/02/2014  . Other and unspecified hyperlipidemia 06/02/2014  . OSA on CPAP 06/02/2014  . 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 Bipolar disorder   . CHF (congestive heart failure)   . CKD (chronic kidney disease), stage III   . Diabetes mellitus without complication   . Hypertension   . Gout   . Bipolar 1 disorder   . OSA on CPAP    History reviewed. No pertinent past surgical history. Family History:  Family History  Problem Relation Age of Onset  . Dementia Mother   . Arthritis Father   . Heart failure Neg Hx   . Kidney failure Neg Hx   . Cancer Neg Hx    Social History:  History  Alcohol Use No     History  Drug Use No    Social History   Social History  . Marital Status: Married    Spouse Name: N/A  . Number of Children: N/A  . Years of Education: N/A   Social History Main Topics  . Smoking status: Never Smoker   . Smokeless tobacco: None  . Alcohol Use: No  . Drug Use: No  . Sexual Activity: Not Currently   Other Topics Concern  . None   Social History Narrative   ** Merged History Encounter **       Additional Social History:  Allergies:  No Known Allergies  Labs:  Results for orders placed or performed during the hospital encounter of 07/29/15 (from the past 48 hour(s))  Glucose, capillary     Status: Abnormal   Collection Time: 08/17/15  9:06 PM  Result Value Ref Range   Glucose-Capillary 214 (H) 65 - 99 mg/dL  Glucose, capillary     Status: Abnormal   Collection Time: 08/18/15  7:40 AM  Result Value Ref Range   Glucose-Capillary 137 (H) 65 - 99 mg/dL   Glucose, capillary     Status: Abnormal   Collection Time: 08/18/15 12:53 PM  Result Value Ref Range   Glucose-Capillary 141 (H) 65 - 99 mg/dL  Glucose, capillary     Status: Abnormal   Collection Time: 08/18/15  1:35 PM  Result Value Ref Range   Glucose-Capillary 153 (H) 65 - 99 mg/dL  Glucose, capillary     Status: Abnormal   Collection Time: 08/18/15  4:05 PM  Result Value Ref Range   Glucose-Capillary 265 (H) 65 - 99 mg/dL   Comment 1 Notify RN   Glucose, capillary     Status: Abnormal   Collection Time: 08/18/15  9:37 PM  Result Value Ref Range   Glucose-Capillary 213 (H) 65 - 99 mg/dL  Creatinine, serum     Status: None   Collection Time: 08/19/15  5:30 AM  Result Value Ref Range   Creatinine, Ser 0.84 0.61 - 1.24 mg/dL   GFR calc non Af Amer >60 >60 mL/min   GFR calc Af Amer >60 >60 mL/min    Comment: (NOTE) The eGFR has been calculated using the CKD EPI equation. This calculation has not been validated in all clinical situations. eGFR's persistently <60 mL/min signify possible Chronic Kidney Disease.   Glucose, capillary     Status: Abnormal   Collection Time: 08/19/15  7:28 AM  Result Value Ref Range   Glucose-Capillary 136 (H) 65 - 99 mg/dL  Glucose, capillary     Status: Abnormal   Collection Time: 08/19/15 11:14 AM  Result Value Ref Range   Glucose-Capillary 317 (H) 65 - 99 mg/dL  Glucose, capillary     Status: Abnormal   Collection Time: 08/19/15  4:34 PM  Result Value Ref Range   Glucose-Capillary 217 (H) 65 - 99 mg/dL    Vitals: Blood pressure 121/73, pulse 112, temperature 98 F (36.7 C), temperature source Oral, resp. rate 20, height 6' (1.829 m), weight 93.895 kg (207 lb), SpO2 99 %.  Risk to Self: Is patient at risk for suicide?: Yes Risk to Others:   Prior Inpatient Therapy:   Prior Outpatient Therapy:    Current Facility-Administered Medications  Medication Dose Route Frequency Provider Last Rate Last Dose  . 0.9 %  sodium chloride infusion    Intravenous Continuous Epifanio Lesches, MD 50 mL/hr at 08/19/15 0902    . 0.9 %  sodium chloride infusion  250 mL Intravenous Once Gonzella Lex, MD   250 mL at 08/19/15 1727  . acetaminophen (TYLENOL) tablet 650 mg  650 mg Oral Q6H PRN [name], MD   650 mg at 07/31/15 6967   Or  . acetaminophen (TYLENOL) suppository 650 mg  650 mg Rectal Q6H PRN [name], MD      . allopurinol (ZYLOPRIM) tablet 300 mg  300 mg Oral Daily [name], MD   300 mg at 08/19/15 0904  . atorvastatin (LIPITOR) tablet 10 mg  10 mg Oral Daily [name],  MD   10 mg at 08/19/15 0903  . cholecalciferol (VITAMIN D) tablet 800 Units  800 Units Oral Daily [name], MD   800 Units at 08/19/15 [id number]  . clonazePAM (KLONOPIN) tablet 1 mg  1 mg Oral QHS [name], MD   1 mg at 08/18/15 2034  . colchicine tablet 0.6 mg  0.6 mg Oral Daily [name], MD   0.6 mg at 08/19/15 0903  . diltiazem (CARDIZEM CD) 24 hr capsule 120 mg  120 mg Oral Daily Max Sane, MD   120 mg at 08/19/15 0903  . docusate sodium (COLACE) capsule 200 mg  200 mg Oral BID Dustin Flock, MD   200 mg at 08/19/15 0903  . enoxaparin (LOVENOX) injection 40 mg  40 mg Subcutaneous Q24H [name], MD   40 mg at 08/18/15 2032  . feeding supplement (GLUCERNA SHAKE) (GLUCERNA SHAKE) liquid 237 mL  237 mL Oral TID WC [name], MD   237 mL at 08/19/15 1728  . finasteride (PROSCAR) tablet 5 mg  5 mg Oral Daily Loletha Grayer, MD   5 mg at 08/19/15 0904  . hydrALAZINE (APRESOLINE) tablet 50 mg  50 mg Oral 4 times per day Epifanio Lesches, MD   50 mg at 08/19/15 1728  . insulin aspart (novoLOG) injection 0-9 Units  0-9 Units Subcutaneous TID WC Dustin Flock, MD   3 Units at 08/19/15 1728  . [START ON 08/20/2015] ketamine (KETALAR) injection 120 mg  120 mg Intravenous Once Gonzella Lex, MD      . Derrill Memo ON 08/20/2015] labetalol (NORMODYNE,TRANDATE) injection 20 mg  20 mg Intravenous Once Gonzella Lex, MD      .  lactulose (CHRONULAC) 10 GM/15ML solution 30 g  30 g Oral Daily PRN Loletha Grayer, MD      . Derrill Memo ON 08/20/2015] lidocaine (cardiac) 100 mg/19m (XYLOCAINE) 20 MG/ML injection 2% 4 mg  4 mg Intravenous Once JGonzella Lex MD      . lisinopril (PRINIVIL,ZESTRIL) tablet 10 mg  10 mg Oral Daily RLoletha Grayer MD   10 mg at 08/19/15 0904  . megestrol (MEGACE) 400 MG/10ML suspension 400 mg  400 mg Oral Daily VHenreitta Leber MD   400 mg at 08/19/15 0903  . metoCLOPramide (REGLAN) injection 10 mg  10 mg Intravenous Once JGonzella Lex MD   10 mg at 08/01/15 1300  . metoprolol succinate (TOPROL-XL) 24 hr tablet 200 mg  200 mg Oral Daily VHenreitta Leber MD   200 mg at 08/19/15 0904  . mirtazapine (REMERON) tablet 45 mg  45 mg Oral QHS JGonzella Lex MD   45 mg at 08/18/15 2033  . OLANZapine zydis (ZYPREXA) disintegrating tablet 30 mg  30 mg Oral QHS JGonzella Lex MD   30 mg at 08/18/15 2041  . ondansetron (ZOFRAN) tablet 4 mg  4 mg Oral Q6H PRN VHenreitta Leber MD       Or  . ondansetron (ZOFRAN) injection 4 mg  4 mg Intravenous Q6H PRN VHenreitta Leber MD      . polyethylene glycol (MIRALAX / GLYCOLAX) packet 17 g  17 g Oral Daily SEpifanio Lesches MD   17 g at 08/19/15 0904  . senna (SENOKOT) tablet 8.6 mg  1 tablet Oral Daily SDustin Flock MD   8.6 mg at 08/19/15 0903  . sodium chloride 0.9 % bolus 1,000 mL  1,000 mL Intravenous PRN DLance Coon MD  1,000 mL at 08/15/15 2218  . [START ON 08/20/2015] succinylcholine (ANECTINE) injection 120 mg  120 mg Intravenous Once Gonzella Lex, MD      . tamsulosin (FLOMAX) capsule 0.4 mg  0.4 mg Oral QHS Loletha Grayer, MD   0.4 mg at 08/18/15 2037  . traZODone (DESYREL) tablet 25 mg  25 mg Oral TID [name], MD   25 mg at 08/19/15 1727    Musculoskeletal: Strength & Muscle Tone: decreased Gait & Station: unable to stand Patient leans: N/A  Psychiatric Specialty Exam: Physical Exam  Nursing note and vitals reviewed. Constitutional:  He appears well-developed and well-nourished.  HENT:  Head: Normocephalic and atraumatic.  Eyes: Conjunctivae are normal. Pupils are equal, round, and reactive to light.  Neck: Normal range of motion.  Cardiovascular: Normal heart sounds.   Respiratory: Effort normal.  GI: Soft.  Musculoskeletal: Normal range of motion.  Neurological: He is alert.  Skin: Skin is warm and dry.  Psychiatric: His affect is blunt. His speech is delayed. He is slowed and withdrawn. He expresses impulsivity. He does not exhibit a depressed mood. He exhibits abnormal recent memory.    Review of Systems  Constitutional: Positive for malaise/fatigue.  HENT: Negative.   Eyes: Negative.   Respiratory: Negative.   Cardiovascular: Negative.   Gastrointestinal: Negative.   Musculoskeletal: Negative.   Skin: Negative.   Neurological: Positive for weakness.  Psychiatric/Behavioral: Positive for depression and memory loss. Negative for suicidal ideas, hallucinations and substance abuse. The patient is nervous/anxious and has insomnia.     Blood pressure 121/73, pulse 112, temperature 98 F (36.7 C), temperature source Oral, resp. rate 20, height 6' (1.829 m), weight 93.895 kg (207 lb), SpO2 99 %.Body mass index is 28.07 kg/(m^2).  General Appearance: Disheveled  Eye Sport and exercise psychologist::  Fair  Speech:  Garbled and Slow  Volume:  Decreased  Mood:  Euthymic  Affect:  Depressed  Thought Process:  Circumstantial  Orientation:  Full (Time, Place, and Person)  Thought Content:  Delusions  Suicidal Thoughts:  No  Homicidal Thoughts:  No  Memory:  Immediate;   Fair Recent;   Fair Remote;   Fair  Judgement:  Impaired  Insight:  Shallow  Psychomotor Activity:  Decreased  Concentration:  Poor  Recall:  Poor  Fund of Knowledge:Fair  Language: Fair  Akathisia:  No  Handed:  Right  AIMS (if indicated):     Assets:  Financial Resources/Insurance Housing Intimacy Resilience Social Support  ADL's:  Impaired  Cognition:  Impaired,  Mild  Sleep:      Medical Decision Making: Established Problem, Stable/Improving (1), Review of Psycho-Social Stressors (1), Review or order clinical lab tests (1), Review and summation of old records (2) and Review of Medication Regimen & Side Effects (2)  Treatment Plan Summary: Medication management and Plan Continue bilateral ECT 3 times a week. Patient is now actively agreeable. Discussed with him the importance of continuing to try to be interactive with physical therapy. Continue current medication doses.  Plan:  Recommend psychiatric Inpatient admission when medically cleared. Supportive therapy provided about ongoing stressors. Disposition: continue on medicine ward for now. ECT 3 times a week. Continue current medicine and monitoring. I'm going to increase his dose of Zyprexa to 20 mg at night  [place] 08/19/2015 8:23 PM

## 2015-08-19 NOTE — Anesthesia Postprocedure Evaluation (Signed)
  Anesthesia Post-op Note  Patient: Christian [name]  Procedure(s) Performed: * No procedures listed *  Anesthesia type:General  Patient location: PACU  Post pain: Pain level controlled  Post assessment: Post-op Vital signs reviewed, Patient's Cardiovascular Status Stable, Respiratory Function Stable, Patent Airway and No signs of Nausea or vomiting  Post vital signs: Reviewed and stable  Last Vitals:  Filed Vitals:   08/19/15 1453  BP: 121/73  Pulse: 112  Temp: 36.7 C  Resp: 20    Level of consciousness: awake, alert  and patient cooperative  Complications: No apparent anesthesia complications

## 2015-08-20 ENCOUNTER — Inpatient Hospital Stay: Payer: Commercial Managed Care - HMO | Admitting: Anesthesiology

## 2015-08-20 ENCOUNTER — Other Ambulatory Visit: Payer: Self-pay | Admitting: *Deleted

## 2015-08-20 ENCOUNTER — Inpatient Hospital Stay: Payer: Commercial Managed Care - HMO

## 2015-08-20 ENCOUNTER — Encounter: Payer: Self-pay | Admitting: Anesthesiology

## 2015-08-20 DIAGNOSIS — Z6828 Body mass index (BMI) 28.0-28.9, adult: Secondary | ICD-10-CM | POA: Diagnosis not present

## 2015-08-20 DIAGNOSIS — F315 Bipolar disorder, current episode depressed, severe, with psychotic features: Secondary | ICD-10-CM | POA: Diagnosis not present

## 2015-08-20 DIAGNOSIS — E669 Obesity, unspecified: Secondary | ICD-10-CM | POA: Diagnosis not present

## 2015-08-20 LAB — GLUCOSE, CAPILLARY
Glucose-Capillary: 125 mg/dL — ABNORMAL HIGH (ref 65–99)
Glucose-Capillary: 145 mg/dL — ABNORMAL HIGH (ref 65–99)
Glucose-Capillary: 282 mg/dL — ABNORMAL HIGH (ref 65–99)
Glucose-Capillary: 203 mg/dL — ABNORMAL HIGH (ref 65–99)

## 2015-08-20 LAB — BASIC METABOLIC PANEL
Anion gap: 3 — ABNORMAL LOW (ref 5–15)
BUN: 18 mg/dL (ref 6–20)
CO2: 26 mmol/L (ref 22–32)
Calcium: 8.5 mg/dL — ABNORMAL LOW (ref 8.9–10.3)
Chloride: 109 mmol/L (ref 101–111)
Creatinine, Ser: 0.87 mg/dL (ref 0.61–1.24)
GFR calc Af Amer: >60 mL/min (ref >60)
GFR calc non Af Amer: >60 mL/min (ref >60)
Glucose, Bld: 144 mg/dL — ABNORMAL HIGH (ref 65–99)
Potassium: 4.1 mmol/L (ref 3.5–5.1)
Sodium: 138 mmol/L (ref 135–145)

## 2015-08-20 MED ORDER — NITROGLYCERIN IN D5W 200-5 MCG/ML-% IV SOLN
0.0000 ug/min | INTRAVENOUS | Status: DC
  Administered 2015-08-20: 100 ug/min via INTRAVENOUS

## 2015-08-20 MED ORDER — LABETALOL HCL 5 MG/ML IV SOLN
20.0000 mg | Freq: Once | INTRAVENOUS | Status: AC
  Administered 2015-08-20: 12:00:00 20 mg via INTRAVENOUS

## 2015-08-20 NOTE — Anesthesia Preprocedure Evaluation (Signed)
Anesthesia Evaluation  Patient identified by MRN, date of birth, ID band Patient confused    History of Anesthesia Complications Negative for: history of anesthetic complications  Airway Mallampati: III       Dental no notable dental hx. (+) Poor Dentition   Pulmonary sleep apnea ,     + decreased breath sounds      Cardiovascular hypertension, Pt. on medications +CHF  Normal cardiovascular exam     Neuro/Psych PSYCHIATRIC DISORDERS (bipolar 1) negative neurological ROS     GI/Hepatic negative GI ROS, Neg liver ROS,   Endo/Other  diabetes, Type 1, Insulin Dependent  Renal/GU CRFRenal disease  negative genitourinary   Musculoskeletal negative musculoskeletal ROS (+)   Abdominal (+) + obese,   Peds  Hematology negative hematology ROS (+)   Anesthesia Other Findings   Reproductive/Obstetrics                             Anesthesia Physical  Anesthesia Plan  ASA: III  Anesthesia Plan: General   Post-op Pain Management:    Induction: Intravenous  Airway Management Planned: Mask  Additional Equipment:   Intra-op Plan:   Post-operative Plan:   Informed Consent: I have reviewed the patients History and Physical, chart, labs and discussed the procedure including the risks, benefits and alternatives for the proposed anesthesia with the patient or authorized representative who has indicated his/her understanding and acceptance.     Plan Discussed with: CRNA  Anesthesia Plan Comments:         Anesthesia Quick Evaluation

## 2015-08-20 NOTE — Consult Note (Signed)
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 Pressure ulcer [L89.90] 07/30/2015  . Protein-calorie malnutrition, severe [E43] 07/30/2015  . Dehydration [E86.0] 07/29/2015  . Bipolar disorder, current episode depressed, severe, with psychotic features [F31.5] 07/09/2015  . Diabetes [E11.9] 07/09/2015  . UTI (urinary tract infection) [N39.0] 06/19/2014  . Positive blood culture [R78.81] 06/19/2014  . Bipolar affective disorder, current episode manic with psychotic symptoms [F31.2] 06/17/2014  . Type II or unspecified type diabetes mellitus with unspecified complication, uncontrolled [E11.8] 06/02/2014  . Other and unspecified hyperlipidemia [E78.5] 06/02/2014  . OSA on CPAP [G47.33] 06/02/2014  . 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 Gout [M10.9] 06/02/2014  . Hypertension [I10]     Total Time spent with patient: 30 minutes  Subjective:   Christian [name] is a 64 y.o. male patient admitted with "I'm all right". He actually said that to me.Marland Kitchen  HPI:  Follow-up for this 64 year old man with severe major depression as part of bipolar disorder. Patient had bilateral ECT this morning. Tolerated treatment well as usual. His affect is looking brighter. He remains passive and needs a lot of assistance for ADLs but he is getting better with cooperating with physical therapy and feeding himself. Tolerating current medications including increased dose of Zyprexa. Plan is to continue ECT. He is continuing to get improved benefit with every  additional treatment. Patient is agreeable to the plan. Once again my thanks as always to medicine for allowing him to stay on the medical service while we continue to treat him. Anticipated length of stay still about 7-10 days .HPI Elements:   Quality:  depression withdrawal psychosis delusions. Severity:  severe and life-threatening with starvation and suicidal ideation. Timing:  still ongoing and has been here for a couple months. Duration:  ongoing. Context:  history of severe bipolar disorder.  Past Medical History:  Past Medical History  Diagnosis Date  . Mood swings   . Type II or unspecified type diabetes mellitus with unspecified complication, uncontrolled 06/02/2014  . Other and unspecified hyperlipidemia 06/02/2014  . OSA on CPAP 06/02/2014  . 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 Bipolar disorder   . CHF (congestive heart failure)   . CKD (chronic kidney disease), stage III   . Diabetes mellitus without complication   . Hypertension   . Gout   . Bipolar 1 disorder   . OSA on CPAP    History reviewed. No pertinent past surgical history. Family History:  Family History  Problem Relation Age of Onset  . Dementia Mother   . Arthritis Father   . Heart failure Neg Hx   . Kidney failure Neg Hx   . Cancer Neg Hx    Social History:  History  Alcohol Use No     History  Drug Use No    Social History   Social History  . Marital Status: Married    Spouse Name: N/A  . Number of Children: N/A  . Years of Education: N/A   Social History Main Topics  . Smoking status: Never Smoker   . Smokeless  tobacco: None  . Alcohol Use: No  . Drug Use: No  . Sexual Activity: Not Currently   Other Topics Concern  . None   Social History Narrative   ** Merged History Encounter **       Additional Social History:                          Allergies:  No Known Allergies  Labs:  Results for orders placed or performed during  the hospital encounter of 07/29/15 (from the past 48 hour(s))  Glucose, capillary     Status: Abnormal   Collection Time: 08/18/15  9:37 PM  Result Value Ref Range   Glucose-Capillary 213 (H) 65 - 99 mg/dL  Creatinine, serum     Status: None   Collection Time: 08/19/15  5:30 AM  Result Value Ref Range   Creatinine, Ser 0.84 0.61 - 1.24 mg/dL   GFR calc non Af Amer >60 >60 mL/min   GFR calc Af Amer >60 >60 mL/min    Comment: (NOTE) The eGFR has been calculated using the CKD EPI equation. This calculation has not been validated in all clinical situations. eGFR's persistently <60 mL/min signify possible Chronic Kidney Disease.   Glucose, capillary     Status: Abnormal   Collection Time: 08/19/15  7:28 AM  Result Value Ref Range   Glucose-Capillary 136 (H) 65 - 99 mg/dL  Glucose, capillary     Status: Abnormal   Collection Time: 08/19/15 11:14 AM  Result Value Ref Range   Glucose-Capillary 317 (H) 65 - 99 mg/dL  Glucose, capillary     Status: Abnormal   Collection Time: 08/19/15  4:34 PM  Result Value Ref Range   Glucose-Capillary 217 (H) 65 - 99 mg/dL  Basic metabolic panel     Status: Abnormal   Collection Time: 08/20/15  4:43 AM  Result Value Ref Range   Sodium 138 135 - 145 mmol/L   Potassium 4.1 3.5 - 5.1 mmol/L   Chloride 109 101 - 111 mmol/L   CO2 26 22 - 32 mmol/L   Glucose, Bld 144 (H) 65 - 99 mg/dL   BUN 18 6 - 20 mg/dL   Creatinine, Ser 0.87 0.61 - 1.24 mg/dL   Calcium 8.5 (L) 8.9 - 10.3 mg/dL   GFR calc non Af Amer >60 >60 mL/min   GFR calc Af Amer >60 >60 mL/min    Comment: (NOTE) The eGFR has been calculated using the CKD EPI equation. This calculation has not been validated in all clinical situations. eGFR's persistently <60 mL/min signify possible Chronic Kidney Disease.    Anion gap 3 (L) 5 - 15  Glucose, capillary     Status: Abnormal   Collection Time: 08/20/15  7:08 AM  Result Value Ref Range   Glucose-Capillary 125 (H) 65 - 99 mg/dL  Glucose,  capillary     Status: Abnormal   Collection Time: 08/20/15  1:25 PM  Result Value Ref Range   Glucose-Capillary 145 (H) 65 - 99 mg/dL  Glucose, capillary     Status: Abnormal   Collection Time: 08/20/15  4:41 PM  Result Value Ref Range   Glucose-Capillary 282 (H) 65 - 99 mg/dL    Vitals: Blood pressure 132/76, pulse 100, temperature 98.8 F (37.1 C), temperature source Oral, resp. rate 18, height 6' (1.829 m), weight 93.895 kg (207 lb), SpO2 98 %.  Risk to Self: Is patient at risk for suicide?: Yes Risk to Others:  Prior Inpatient Therapy:   Prior Outpatient Therapy:    Current Facility-Administered Medications  Medication Dose Route Frequency Provider Last Rate Last Dose  . 0.9 %  sodium chloride infusion   Intravenous Continuous Epifanio Lesches, MD 50 mL/hr at 08/20/15 1322    . acetaminophen (TYLENOL) tablet 650 mg  650 mg Oral Q6H PRN [name], MD   650 mg at 07/31/15 3154   Or  . acetaminophen (TYLENOL) suppository 650 mg  650 mg Rectal Q6H PRN [name], MD      . allopurinol (ZYLOPRIM) tablet 300 mg  300 mg Oral Daily [name], MD   300 mg at 08/20/15 1343  . atorvastatin (LIPITOR) tablet 10 mg  10 mg Oral Daily [name], MD   10 mg at 08/19/15 0903  . cholecalciferol (VITAMIN D) tablet 800 Units  800 Units Oral Daily [name], MD   800 Units at 08/19/15 [phone]  . clonazePAM (KLONOPIN) tablet 1 mg  1 mg Oral QHS [name], MD   1 mg at 08/20/15 2100  . colchicine tablet 0.6 mg  0.6 mg Oral Daily [name], MD   0.6 mg at 08/20/15 1343  . diltiazem (CARDIZEM CD) 24 hr capsule 120 mg  120 mg Oral Daily Vipul Shah, MD   120 mg at 08/20/15 1344  . docusate sodium (COLACE) capsule 200 mg  200 mg Oral BID Dustin Flock, MD   200 mg at 08/20/15 2100  . enoxaparin (LOVENOX) injection 40 mg  40 mg Subcutaneous Q24H [name], MD   40 mg at 08/20/15 2100  . feeding supplement (GLUCERNA SHAKE) (GLUCERNA SHAKE) liquid 237 mL  237 mL  Oral TID WC [name], MD   237 mL at 08/20/15 1700  . finasteride (PROSCAR) tablet 5 mg  5 mg Oral Daily Loletha Grayer, MD   5 mg at 08/20/15 1343  . hydrALAZINE (APRESOLINE) tablet 50 mg  50 mg Oral 4 times per day Epifanio Lesches, MD   50 mg at 08/20/15 1650  . insulin aspart (novoLOG) injection 0-9 Units  0-9 Units Subcutaneous TID WC Dustin Flock, MD   5 Units at 08/20/15 1649  . lactulose (CHRONULAC) 10 GM/15ML solution 30 g  30 g Oral Daily PRN Loletha Grayer, MD      . lidocaine (cardiac) 100 mg/73m (XYLOCAINE) 20 MG/ML injection 2% 4 mg  4 mg Intravenous Once JGonzella Lex MD      . lisinopril (PRINIVIL,ZESTRIL) tablet 10 mg  10 mg Oral Daily RLoletha Grayer MD   10 mg at 08/20/15 1343  . megestrol (MEGACE) 400 MG/10ML suspension 400 mg  400 mg Oral Daily VHenreitta Leber MD   400 mg at 08/19/15 0903  . metoCLOPramide (REGLAN) injection 10 mg  10 mg Intravenous Once JGonzella Lex MD   10 mg at 08/01/15 1300  . metoprolol succinate (TOPROL-XL) 24 hr tablet 200 mg  200 mg Oral Daily VHenreitta Leber MD   200 mg at 08/20/15 0748  . mirtazapine (REMERON) tablet 45 mg  45 mg Oral QHS JGonzella Lex MD   45 mg at 08/20/15 2100  . OLANZapine zydis (ZYPREXA) disintegrating tablet 30 mg  30 mg Oral QHS JGonzella Lex MD   30 mg at 08/20/15 2100  . ondansetron (ZOFRAN) tablet 4 mg  4 mg Oral Q6H PRN VHenreitta Leber MD       Or  . ondansetron ([facility]  injection 4 mg  4 mg Intravenous Q6H PRN [name], MD      . polyethylene glycol (MIRALAX / GLYCOLAX) packet 17 g  17 g Oral Daily Epifanio Lesches, MD   17 g at 08/20/15 1343  . senna (SENOKOT) tablet 8.6 mg  1 tablet Oral Daily Dustin Flock, MD   8.6 mg at 08/20/15 1344  . sodium chloride 0.9 % bolus 1,000 mL  1,000 mL Intravenous PRN Lance Coon, MD   1,000 mL at 08/15/15 2218  . tamsulosin (FLOMAX) capsule 0.4 mg  0.4 mg Oral QHS Loletha Grayer, MD   0.4 mg at 08/20/15 2100  . traZODone (DESYREL) tablet 25 mg   25 mg Oral TID [name], MD   25 mg at 08/20/15 2100    Musculoskeletal: Strength & Muscle Tone: decreased Gait & Station: unable to stand Patient leans: N/A  Psychiatric Specialty Exam: Physical Exam  Nursing note and vitals reviewed. Constitutional: He appears well-developed and well-nourished.  HENT:  Head: Normocephalic and atraumatic.  Eyes: Conjunctivae are normal. Pupils are equal, round, and reactive to light.  Neck: Normal range of motion.  Cardiovascular: Normal heart sounds.   Respiratory: Effort normal.  GI: Soft.  Musculoskeletal: Normal range of motion.  Neurological: He is alert.  Skin: Skin is warm and dry.  Psychiatric: Judgment normal. His affect is blunt. His speech is delayed. He is slowed. He is not withdrawn. He does not express impulsivity. He does not exhibit a depressed mood. He exhibits abnormal recent memory.    Review of Systems  Constitutional: Positive for malaise/fatigue.  HENT: Negative.   Eyes: Negative.   Respiratory: Negative.   Cardiovascular: Negative.   Gastrointestinal: Negative.   Musculoskeletal: Negative.   Skin: Negative.   Neurological: Positive for weakness.  Psychiatric/Behavioral: Positive for depression and memory loss. Negative for suicidal ideas, hallucinations and substance abuse. The patient is nervous/anxious and has insomnia.     Blood pressure 132/76, pulse 100, temperature 98.8 F (37.1 C), temperature source Oral, resp. rate 18, height 6' (1.829 m), weight 93.895 kg (207 lb), SpO2 98 %.Body mass index is 28.07 kg/(m^2).  General Appearance: Disheveled  Eye Sport and exercise psychologist::  Fair  Speech:  Garbled and Slow  Volume:  Decreased  Mood:  Euthymic  Affect:  Depressed  Thought Process:  Circumstantial  Orientation:  Full (Time, Place, and Person)  Thought Content:  Delusions  Suicidal Thoughts:  No  Homicidal Thoughts:  No  Memory:  Immediate;   Fair Recent;   Fair Remote;   Fair  Judgement:  Impaired  Insight:   Shallow  Psychomotor Activity:  Decreased  Concentration:  Poor  Recall:  Poor  Fund of Knowledge:Fair  Language: Fair  Akathisia:  No  Handed:  Right  AIMS (if indicated):     Assets:  Financial Resources/Insurance Housing Intimacy Resilience Social Support  ADL's:  Impaired  Cognition: Impaired,  Mild  Sleep:      Medical Decision Making: Established Problem, Stable/Improving (1), Review of Psycho-Social Stressors (1), Review or order clinical lab tests (1), Review and summation of old records (2) and Review of Medication Regimen & Side Effects (2)  Treatment Plan Summary: Medication management and Plan Continue bilateral ECT 3 times a week. Patient is now actively agreeable. Discussed with him the importance of continuing to try to be interactive with physical therapy. Continue current medication doses.  Plan:  Recommend psychiatric Inpatient admission when medically cleared. Supportive therapy provided about ongoing stressors. Disposition: continue on  medicine ward for now. ECT 3 times a week. Continue current medicine and monitoring. I'm going to increase his dose of Zyprexa to 20 mg at night  [name] 08/20/2015 9:00 PM

## 2015-08-20 NOTE — Care Management Important Message (Signed)
Important Message  Patient Details  Name: Christian [name] MRN: [id number] Date of Birth: 23-Dec-1950   Medicare Important Message Given:  Yes-second notification given    [name] 08/20/2015, 9:55 AM

## 2015-08-20 NOTE — H&P (Signed)
Christian [name] is an 64 y.o. male.   Chief Complaint: Patient reports he is feeling better. No new complaints HPI: Continued improvement in mood attention and cognition since last treatment. No new physical complaints. Strength is improving  Past Medical History  Diagnosis Date  . Mood swings   . Type II or unspecified type diabetes mellitus with unspecified complication, uncontrolled 06/02/2014  . Other and unspecified hyperlipidemia 06/02/2014  . OSA on CPAP 06/02/2014  . 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 Bipolar disorder   . CHF (congestive heart failure)   . CKD (chronic kidney disease), stage III   . Diabetes mellitus without complication   . Hypertension   . Gout   . Bipolar 1 disorder   . OSA on CPAP     History reviewed. No pertinent past surgical history.  Family History  Problem Relation Age of Onset  . Dementia Mother   . Arthritis Father   . Heart failure Neg Hx   . Kidney failure Neg Hx   . Cancer Neg Hx    Social History:  reports that he has never smoked. He does not have any smokeless tobacco history on file. He reports that he does not drink alcohol or use illicit drugs.  Allergies: No Known Allergies  Medications Prior to Admission  Medication Sig Dispense Refill  . allopurinol (ZYLOPRIM) 300 MG tablet Take 1 tablet (300 mg total) by mouth daily. For gout    . amLODipine (NORVASC) 10 MG tablet Take 10 mg by mouth daily.    Marland Kitchen atorvastatin (LIPITOR) 10 MG tablet Take 1 tablet (10 mg total) by mouth daily. For high cholesterol    . clonazePAM (KLONOPIN) 1 MG tablet Take 1 tablet (1 mg total) by mouth at bedtime. (Patient taking differently: Take 0.5 mg by mouth 2 (two) times daily. ) 30 tablet 0  . diltiazem (CARDIZEM CD) 120 MG 24 hr capsule Take 1 capsule (120 mg total) by mouth daily. 30 capsule 0  . hydrALAZINE (APRESOLINE) 25 MG tablet Take 1 tablet (25 mg total) by mouth every 6 (six) hours.    . metoprolol  (TOPROL-XL) 200 MG 24 hr tablet Take 1 tablet (200 mg total) by mouth daily. For high blood pressure (Patient taking differently: Take 100 mg by mouth 2 (two) times daily. For high blood pressure)    . mirtazapine (REMERON) 30 MG tablet Take 30 mg by mouth at bedtime.    . traZODone (DESYREL) 50 MG tablet Take 0.5 tablets (25 mg total) by mouth 3 (three) times daily. (Patient taking differently: Take 200 mg by mouth at bedtime. )    . aspirin EC 81 MG tablet Take 81 mg by mouth daily.    . cholecalciferol (VITAMIN D) 400 UNITS TABS tablet Take 800 Units by mouth daily.    . colchicine 0.6 MG tablet Take 1 tablet (0.6 mg total) by mouth 2 (two) times daily. For Gout pain (Patient taking differently: Take 0.6 mg by mouth daily. For Gout pain)    . feeding supplement, GLUCERNA SHAKE, (GLUCERNA SHAKE) LIQD Take 237 mLs by mouth 3 (three) times daily with meals.  0  . furosemide (LASIX) 20 MG tablet Take 20 mg by mouth daily.    . megestrol (MEGACE) 400 MG/10ML suspension Take 10 mLs (400 mg total) by mouth daily. 240 mL 0  . OLANZapine (ZYPREXA) injection Inject 10 mg into the muscle 2 (two) times daily before a meal. 1 each  Results for orders placed or performed during the hospital encounter of 07/29/15 (from the past 48 hour(s))  Glucose, capillary     Status: Abnormal   Collection Time: 08/18/15 12:53 PM  Result Value Ref Range   Glucose-Capillary 141 (H) 65 - 99 mg/dL  Glucose, capillary     Status: Abnormal   Collection Time: 08/18/15  1:35 PM  Result Value Ref Range   Glucose-Capillary 153 (H) 65 - 99 mg/dL  Glucose, capillary     Status: Abnormal   Collection Time: 08/18/15  4:05 PM  Result Value Ref Range   Glucose-Capillary 265 (H) 65 - 99 mg/dL   Comment 1 Notify RN   Glucose, capillary     Status: Abnormal   Collection Time: 08/18/15  9:37 PM  Result Value Ref Range   Glucose-Capillary 213 (H) 65 - 99 mg/dL  Creatinine, serum     Status: None   Collection Time: 08/19/15   5:30 AM  Result Value Ref Range   Creatinine, Ser 0.84 0.61 - 1.24 mg/dL   GFR calc non Af Amer >60 >60 mL/min   GFR calc Af Amer >60 >60 mL/min    Comment: (NOTE) The eGFR has been calculated using the CKD EPI equation. This calculation has not been validated in all clinical situations. eGFR's persistently <60 mL/min signify possible Chronic Kidney Disease.   Glucose, capillary     Status: Abnormal   Collection Time: 08/19/15  7:28 AM  Result Value Ref Range   Glucose-Capillary 136 (H) 65 - 99 mg/dL  Glucose, capillary     Status: Abnormal   Collection Time: 08/19/15 11:14 AM  Result Value Ref Range   Glucose-Capillary 317 (H) 65 - 99 mg/dL  Glucose, capillary     Status: Abnormal   Collection Time: 08/19/15  4:34 PM  Result Value Ref Range   Glucose-Capillary 217 (H) 65 - 99 mg/dL  Basic metabolic panel     Status: Abnormal   Collection Time: 08/20/15  4:43 AM  Result Value Ref Range   Sodium 138 135 - 145 mmol/L   Potassium 4.1 3.5 - 5.1 mmol/L   Chloride 109 101 - 111 mmol/L   CO2 26 22 - 32 mmol/L   Glucose, Bld 144 (H) 65 - 99 mg/dL   BUN 18 6 - 20 mg/dL   Creatinine, Ser 0.87 0.61 - 1.24 mg/dL   Calcium 8.5 (L) 8.9 - 10.3 mg/dL   GFR calc non Af Amer >60 >60 mL/min   GFR calc Af Amer >60 >60 mL/min    Comment: (NOTE) The eGFR has been calculated using the CKD EPI equation. This calculation has not been validated in all clinical situations. eGFR's persistently <60 mL/min signify possible Chronic Kidney Disease.    Anion gap 3 (L) 5 - 15  Glucose, capillary     Status: Abnormal   Collection Time: 08/20/15  7:08 AM  Result Value Ref Range   Glucose-Capillary 125 (H) 65 - 99 mg/dL   No results found.  Review of Systems  Constitutional: Negative.  Negative for malaise/fatigue.  HENT: Negative.   Eyes: Negative.   Respiratory: Negative.   Cardiovascular: Negative.   Gastrointestinal: Negative.   Musculoskeletal: Negative.   Skin: Negative.   Neurological:  Negative.  Negative for weakness.  Psychiatric/Behavioral: Positive for depression and memory loss. Negative for suicidal ideas, hallucinations and substance abuse. The patient is not nervous/anxious and does not have insomnia.     Blood pressure 161/88, pulse 83, temperature 98.6 F (  37 C), temperature source Oral, resp. rate 18, height 6' (1.829 m), weight 93.895 kg (207 lb), SpO2 100 %. Physical Exam  Nursing note and vitals reviewed. Cardiovascular: Normal rate, regular rhythm and normal heart sounds.   Respiratory: Effort normal and breath sounds normal. No respiratory distress. He has no wheezes. He has no rales. He exhibits no tenderness.     Assessment/Plan Continued improvement although slow. Continue bilateral ECT treatment. Treatment today follow-up Friday  Mlissa Tamayo 08/20/2015, 10:17 AM

## 2015-08-20 NOTE — Plan of Care (Signed)
Problem: Discharge Progression Outcomes Goal: Other Discharge Outcomes/Goals Outcome: Progressing Plan of care progress to goal: Pt had ECT today. Tolerated well. A&Ox4. Pt eating very well. Sitter remains at the bedside for 1:1 IVC. Up with assistance to void in urinal and sit in chair. No c/o pain. IVF infusing.

## 2015-08-20 NOTE — Plan of Care (Signed)
Problem: Discharge Progression Outcomes Goal: Other Discharge Outcomes/Goals Outcome: Progressing Plan of care progress to goal: Pt going for ECT today (9/21).  NPO since midnight VSS Pt continues with 1:1 sitter Stands to use urinal.  Tolerating well

## 2015-08-20 NOTE — Progress Notes (Signed)
Nutrition Follow-up  DOCUMENTATION CODES:   Severe malnutrition in context of acute illness/injury  INTERVENTION:   Meals and Snacks: Cater to patient preferences, continue encouragement of po intake. Recommend liberalizing diet order after ECT treatments to Regular Diet.  Medical Food Supplement Therapy: Continue Glucerna as ordered. Coordination of Care: Please re-consult RD if change in nutritional status, as pt now meeting nutritional needs with supplements ordered and weight stable per documentation.    NUTRITION DIAGNOSIS:   Inadequate oral intake related to acute illness as evidenced by meal completion < 25%, improved  GOAL:   Patient will meet greater than or equal to 90% of their needs; ongoing  MONITOR:    (Energy Intake, Electrolyte and Renal Profile, Anthropometrics, Skin)  REASON FOR ASSESSMENT:   Malnutrition Screening Tool    ASSESSMENT:   Pt admitted from behavioral health unit for PICC line placement for ECT treatments.  Pt continues with ECT treatments. Pt has been working with PT as well.  Diet Order:  Diet NPO time specified    Current Nutrition: Pt NPO this am for ECT treatment. Pt with much improved po intake, eating >75% of meals. RN Denny Peon and RN Dot Lanes both report pt eating almost all of meal trays. RN Dot Lanes reports pt ate chicken brought in by wife this past Friday (on his birthday) whic pt also ate very well.  Recorded po intake on average 83% of meals since last assessment.   Gastrointestinal Profile: Last BM: 08/10/2015   Medications: Remeron, Megace, Miralax, Senokot, Colace, Lactulose, vitamin D   Electrolyte/Renal Profile and Glucose Profile:   Recent Labs Lab 08/17/15 0559 08/19/15 0530 08/20/15 0443  NA  --   --  138  K  --   --  4.1  CL  --   --  109  CO2  --   --  26  BUN  --   --  18  CREATININE 0.84 0.84 0.87  CALCIUM  --   --  8.5*  GLUCOSE  --   --  144*   Protein Profile: No results for input(s): ALBUMIN in the last  168 hours.    Weight Trend since Admission: Filed Weights   08/15/15 1035 08/18/15 0929 08/20/15 1009  Weight: 207 lb (93.895 kg) 207 lb (93.895 kg) 207 lb (93.895 kg)     Skin:   (Deep tissure injury to left heel)  BMI:  Body mass index is 28.07 kg/(m^2).  Estimated Nutritional Needs:   Kcal:  2346-2773kcals, BEE: 1778kcals, TEE: (IF 1.1-1.3)(AF 1.2)   Protein:  94-113g protein (1.0-1.2g/kg)   Fluid:  2350-2827mL of fluid (25-41mL/kg)    Leda Quail, RD, LDN Pager [phone]

## 2015-08-20 NOTE — Procedures (Signed)
ECT SERVICES Physician's Interval Evaluation & Treatment Note  Patient Identification: MATHESON GAVETTE MRN:  [id number] Date of Evaluation:  08/20/2015 TX #:  [id number]  MADRS:  27  MMSE:  28  P.E. Findings:   patient is making more effort in physical therapy strength is gradually improving  Psychiatric Interval Note:   mood continues to be down although his affect is more interactive and less irritable and more appropriate  Subjective:  Patient is a 65 y.o. male seen for evaluation for Electroconvulsive Therapy.  no new complaint  Treatment Summary:   []   Right Unilateral             [x]  Bilateral   % Energy :  1.0 ms 100%   Impedance:  660 ohms  Seizure Energy Index:  1235 V squared  Postictal Suppression Index:  56%  Seizure Concordance Index:  91%  Medications  Pre Shock:  Xylocaine 4 mg, labetalol 20 mg, ketamine 120 mg, succinylcholine 120 mg  Post Shock:  Labetalol 20 mg, nitroglycerin 100 g  Seizure Duration:  10 seconds by EMG, 54 seconds by EEG   Comments:  next treatment Friday, September 23   Lungs:  [x]   Clear to auscultation               []  Other:   Heart:    [x]   Regular rhythm             []  irregular rhythm    [x]   Previous H&P reviewed, patient examined and there are NO CHANGES                 []   Previous H&P reviewed, patient examined and there are changes noted.   [name], MD 9/21/201612:07 PM

## 2015-08-20 NOTE — Anesthesia Postprocedure Evaluation (Signed)
  Anesthesia Post-op Note  Patient: Christian [name]  Procedure(s) Performed: * No procedures listed *  Anesthesia type:General  Patient location: PACU  Post pain: Pain level controlled  Post assessment: Post-op Vital signs reviewed, Patient's Cardiovascular Status Stable, Respiratory Function Stable, Patent Airway and No signs of Nausea or vomiting  Post vital signs: Reviewed and stable  Last Vitals:  Filed Vitals:   08/20/15 1301  BP:   Pulse:   Temp: 36.3 C  Resp:     Level of consciousness: awake, alert  and patient cooperative  Complications: No apparent anesthesia complications

## 2015-08-20 NOTE — Transfer of Care (Signed)
Immediate Anesthesia Transfer of Care Note  Patient: Christian [name]  Procedure(s) Performed: * No procedures listed *  Patient Location: PACU  Anesthesia Type:General  Level of Consciousness: sedated  Airway & Oxygen Therapy: Patient Spontanous Breathing and Patient connected to face mask oxygen  Post-op Assessment: Report given to RN and Post -op Vital signs reviewed and stable  Post vital signs: Reviewed and stable  Last Vitals:  Filed Vitals:   08/20/15 1009  BP: 161/88  Pulse: 83  Temp: 37 C  Resp: 18    Complications: No apparent anesthesia complications

## 2015-08-20 NOTE — Progress Notes (Signed)
Patient ID: Christian [name], male   DOB: 1951-01-19, 64 y.o.   MRN: [id number]  [facility] PROGRESS NOTE  PCP: Leanor Rubenstein, MD  HPI/Subjective Appetite is better, feels overall better, although interaction is minimal  Objective: Filed Vitals:   08/20/15 1009  BP: 161/88  Pulse: 83  Temp: 98.6 F (37 C)  Resp: 18    Filed Weights   08/15/15 1035 08/18/15 0929 08/20/15 1009  Weight: 93.895 kg (207 lb) 93.895 kg (207 lb) 93.895 kg (207 lb)    ROS: Review of Systems  Respiratory: Negative for cough and shortness of breath.   Cardiovascular: Negative for chest pain.  Gastrointestinal: Positive for constipation. Negative for nausea, vomiting and abdominal pain.  Musculoskeletal: Negative for joint pain.  Neurological: Positive for weakness.  Psychiatric/Behavioral: Positive for depression.   Exam: Physical Exam  HENT:  Nose: No mucosal edema.  Mouth/Throat: No oropharyngeal exudate or posterior oropharyngeal edema.  Eyes: Conjunctivae are normal. Pupils are equal, round, and reactive to light.  Eyelids seem swollen today.  Neck: No JVD present. Carotid bruit is not present. No edema present. No thyroid mass and no thyromegaly present.  Cardiovascular: S1 normal and S2 normal.  Exam reveals no gallop.   No murmur heard. Pulses:      Dorsalis pedis pulses are 2+ on the right side, and 2+ on the left side.  Respiratory: No respiratory distress. He has no wheezes. He has no rhonchi. He has no rales.  GI: Soft. Bowel sounds are normal. There is no tenderness.  Musculoskeletal:       Right ankle: He exhibits swelling.       Left ankle: He exhibits swelling.  Lymphadenopathy:    He has no cervical adenopathy.  Neurological: He is alert.  Skin: Skin is warm. No rash noted. Nails show no clubbing.  Psychiatric: He exhibits a depressed mood.     CBG:  Recent Labs Lab 08/18/15 2137 08/19/15 0728 08/19/15 1114 08/19/15 1634 08/20/15 0708  GLUCAP 213*  136* 317* 217* 125*    Scheduled Meds: . sodium chloride  250 mL Intravenous Once  . allopurinol  300 mg Oral Daily  . atorvastatin  10 mg Oral Daily  . cholecalciferol  800 Units Oral Daily  . clonazePAM  1 mg Oral QHS  . colchicine  0.6 mg Oral Daily  . diltiazem  120 mg Oral Daily  . docusate sodium  200 mg Oral BID  . enoxaparin (LOVENOX) injection  40 mg Subcutaneous Q24H  . feeding supplement (GLUCERNA SHAKE)  237 mL Oral TID WC  . finasteride  5 mg Oral Daily  . hydrALAZINE  50 mg Oral 4 times per day  . insulin aspart  0-9 Units Subcutaneous TID WC  . ketamine  120 mg Intravenous Once  . labetalol  20 mg Intravenous Once  . lidocaine (cardiac) 100 mg/26ml  4 mg Intravenous Once  . lisinopril  10 mg Oral Daily  . megestrol  400 mg Oral Daily  . metoCLOPramide (REGLAN) injection  10 mg Intravenous Once  . metoprolol succinate  200 mg Oral Daily  . mirtazapine  45 mg Oral QHS  . OLANZapine zydis  30 mg Oral QHS  . polyethylene glycol  17 g Oral Daily  . senna  1 tablet Oral Daily  . succinylcholine  120 mg Intravenous Once  . tamsulosin  0.4 mg Oral QHS  . traZODone  25 mg Oral TID   Continuous Infusions: . sodium chloride 50 mL/hr  at 08/19/15 0902    Assessment/Plan:  1. Failure to thrive, dehydration, severe depression. ECT treatments Monday Wednesday Friday as per Dr. Toni Amend. Patient on IV fluids to maintain hydration status, although patient's oral intake is improving. All other psychiatric medications as per psychiatry. Improvement with his mood. Has been eating better 2. Essential hypertension- currently stable on usual medications 3. Type 2 diabetes- difficult to manage based on him intermittently eating. Patient is on sliding scale at this point. 4. Hyperlipidemia unspecified on atorvastatin 5. History of gout on allopurinol 6. Overflow incontinence- I asked the nursing staff to stand him up every 4 hours with the urinal to have him urinate. With the ECT  treatment ends up losing a lot of his urine so a condom catheter is needed. Initiated on Flomax as well as finasteride for BPH 7. BPH- Flomax and finasteride, well-appearing clinically 8. Constipation-  MiraLAX. Senna. When necessary lactulose if does not have a bowel movement today.  Code Status:     Code Status Orders        Start     Ordered   07/29/15 2027  Full code   Continuous     07/29/15 2028     Disposition Plan: To be determined  Consultants:  Psychiatry  Procedures:  ECT treatments  Time spent:  [facility]  [facility] Hospitalists

## 2015-08-20 NOTE — Progress Notes (Signed)
Physical Therapy Treatment Patient Details Name: Christian [name] MRN: [id number] DOB: April 30, 1951 Today's Date: 08/20/2015    History of Present Illness Pt is a 64 year old male with bipolar disorder with recent suicide attempt and agitation and threats to wife. Pt transfered to this unit for IV access while he recieives ECT treatment.     PT Comments    Pt refuses PT initially, but with strong encouragement, agrees. Pt sits edge of bed and participates in exercises. In preparation for walking pt abruptly takes gait belt off and throws himself back to bed. Strong encouragement to continue and benefits of ambulation. Pt adamantly refuses. Continue to attempt PT for progression of strength and ambulation for improved functional mobility.   Follow Up Recommendations  SNF     Equipment Recommendations  Rolling walker with 5" wheels    Recommendations for Other Services       Precautions / Restrictions Precautions Precautions: Fall Restrictions Weight Bearing Restrictions: No    Mobility  Bed Mobility Overal bed mobility: Modified Independent Bed Mobility: Sidelying to Sit   Sidelying to sit: Modified independent (Device/Increase time)       General bed mobility comments:  (Strong encouragement to participate)  Transfers                 General transfer comment: after sitting edge of bed for exercises and attempting to don gait belt, pt undoes gait belt whips it off and abruptly lies back down refusing further therapy  Ambulation/Gait             General Gait Details: Refuses despite strong encouragement   Stairs            Wheelchair Mobility    Modified Rankin (Stroke Patients Only)       Balance                                    Cognition Arousal/Alertness: Awake/alert Behavior During Therapy: Flat affect;Impulsive Overall Cognitive Status: History of cognitive impairments - at baseline                       Exercises General Exercises - Lower Extremity Long Arc Quad: AROM;Both;20 reps;Seated Hip Flexion/Marching: AROM;Both;20 reps;Seated Toe Raises: AROM;Both;20 reps;Seated Heel Raises: AROM;Both;20 reps;Seated    General Comments        Pertinent Vitals/Pain Pain Assessment:  (Reports new chest pain, but unsure of when it started.)    Home Living                      Prior Function            PT Goals (current goals can now be found in the care plan section) Progress towards PT goals: Not progressing toward goals - comment    Frequency  Min 2X/week    PT Plan Current plan remains appropriate    Co-evaluation             End of Session   Activity Tolerance: Patient limited by fatigue;Patient limited by lethargy (self limiting) Patient left: in bed;with call bell/phone within reach;with bed alarm set;with nursing/sitter in room     Time: 1429-1441 PT Time Calculation (min) (ACUTE ONLY): 12 min  Charges:  $Therapeutic Exercise: 8-22 mins                    G Codes:  Elsie Stain Bishop 08/20/2015, 2:52 PM

## 2015-08-21 LAB — GLUCOSE, CAPILLARY
Glucose-Capillary: 175 mg/dL — ABNORMAL HIGH (ref 65–99)
Glucose-Capillary: 237 mg/dL — ABNORMAL HIGH (ref 65–99)
Glucose-Capillary: 209 mg/dL — ABNORMAL HIGH (ref 65–99)
Glucose-Capillary: 264 mg/dL — ABNORMAL HIGH (ref 65–99)

## 2015-08-21 MED ORDER — INSULIN GLARGINE 100 UNIT/ML ~~LOC~~ SOLN
5.0000 [IU] | Freq: Every day | SUBCUTANEOUS | Status: DC
  Administered 2015-08-21 – 2015-08-23 (×3): 5 [IU] via SUBCUTANEOUS
  Filled 2015-08-21 (×4): qty 0.05

## 2015-08-21 NOTE — Plan of Care (Signed)
Problem: Discharge Progression Outcomes Goal: Other Discharge Outcomes/Goals Outcome: Progressing Plan of care progress to goal Pt had ECT yesterday. Took meds without any difficulty.  Pt did not express any behavioral issues this shift. Sitter at bedside due to IVC.  Vital signs stable. Eating and drinking well.

## 2015-08-21 NOTE — Progress Notes (Signed)
Inpatient Diabetes Program Recommendations  AACE/ADA: New Consensus Statement on Inpatient Glycemic Control (2015)  Target Ranges:  Prepandial:   less than 140 mg/dL      Peak postprandial:   less than 180 mg/dL (1-2 hours)      Critically ill patients:  140 - 180 mg/dL   Review of Glycemic Control  Diabetes history: Type 2, A1C 7.5% on 07/11/15 Outpatient Diabetes medications: Glucotrol 5mg  qday Current orders for Inpatient glycemic control: Novolog 0-9 units tid with meals  Inpatient Diabetes Program Recommendations:Post meal blood sugars remain elevated with current dose of Novolog 0-9 units tid-    Consider increasing Novolog to moderate correction scale 0-15 units tid.  Susette Racer, RN, BA, MHA, CDE Diabetes Coordinator Inpatient Diabetes Program  [phone] (Team Pager) [phone] [facility] Office) 08/21/2015 12:41 PM

## 2015-08-21 NOTE — Progress Notes (Signed)
Patient ID: Christian [name], male   DOB: 1951-03-02, 64 y.o.   MRN: [id number]  [facility] Physicians PROGRESS NOTE  PCP: Leanor Rubenstein, MD  HPI/Subjective Appetite is better, feels overall better, has better verbal communication and eye contact., Overall feels good. Psychiatrist recommends to continue ECT 3 times a week and continue management on medicine floor. Zyprexa dose has been increased  Objective: Filed Vitals:   08/21/15 1046  BP: 133/80  Pulse: 95  Temp: 98.1 F (36.7 C)  Resp: 20    Filed Weights   08/15/15 1035 08/18/15 0929 08/20/15 1009  Weight: 93.895 kg (207 lb) 93.895 kg (207 lb) 93.895 kg (207 lb)    ROS: Review of Systems  Respiratory: Negative for cough and shortness of breath.   Cardiovascular: Negative for chest pain.  Gastrointestinal: Positive for heartburn and constipation. Negative for nausea, vomiting and abdominal pain.  Musculoskeletal: Negative for joint pain.  Neurological: Positive for weakness.  Psychiatric/Behavioral: Positive for depression.   Exam: Physical Exam  HENT:  Nose: No mucosal edema.  Mouth/Throat: No oropharyngeal exudate or posterior oropharyngeal edema.  Eyes: Conjunctivae are normal. Pupils are equal, round, and reactive to light.  Eyelids seem swollen today.  Neck: No JVD present. Carotid bruit is not present. No edema present. No thyroid mass and no thyromegaly present.  Cardiovascular: S1 normal and S2 normal.  Exam reveals no gallop.   No murmur heard. Pulses:      Dorsalis pedis pulses are 2+ on the right side, and 2+ on the left side.  Respiratory: No respiratory distress. He has no wheezes. He has no rhonchi. He has no rales.  GI: Soft. Bowel sounds are normal. There is no tenderness.  Musculoskeletal:       Right ankle: He exhibits swelling.       Left ankle: He exhibits swelling.  Lymphadenopathy:    He has no cervical adenopathy.  Neurological: He is alert.  Skin: Skin is warm. No rash noted. Nails  show no clubbing.  Psychiatric: He exhibits a depressed mood.     CBG:  Recent Labs Lab 08/20/15 1325 08/20/15 1641 08/20/15 2103 08/21/15 0711 08/21/15 1116  GLUCAP 145* 282* 203* 175* 237*    Scheduled Meds: . allopurinol  300 mg Oral Daily  . atorvastatin  10 mg Oral Daily  . cholecalciferol  800 Units Oral Daily  . clonazePAM  1 mg Oral QHS  . colchicine  0.6 mg Oral Daily  . diltiazem  120 mg Oral Daily  . docusate sodium  200 mg Oral BID  . enoxaparin (LOVENOX) injection  40 mg Subcutaneous Q24H  . feeding supplement (GLUCERNA SHAKE)  237 mL Oral TID WC  . finasteride  5 mg Oral Daily  . hydrALAZINE  50 mg Oral 4 times per day  . insulin aspart  0-9 Units Subcutaneous TID WC  . lidocaine (cardiac) 100 mg/12ml  4 mg Intravenous Once  . lisinopril  10 mg Oral Daily  . megestrol  400 mg Oral Daily  . metoCLOPramide (REGLAN) injection  10 mg Intravenous Once  . metoprolol succinate  200 mg Oral Daily  . mirtazapine  45 mg Oral QHS  . OLANZapine zydis  30 mg Oral QHS  . polyethylene glycol  17 g Oral Daily  . senna  1 tablet Oral Daily  . tamsulosin  0.4 mg Oral QHS  . traZODone  25 mg Oral TID   Continuous Infusions: . sodium chloride 50 mL/hr at 08/21/15 1212  Assessment/Plan:  1. Failure to thrive, dehydration due to severe depression. ECT treatments Monday Wednesday Friday as per Dr. Toni Amend. Patient is to be continued on IV fluids to maintain hydration status, although patient's oral intake has been improving. Zyprexa dose has been advanced per psychiatry. Improvement with mood, appetite noted. Following closely and initiate IV fluids as needed 2. Essential hypertension- currently stable on usual medications 3. Type 2 diabetes- difficult to manage based on him intermittently eating. Patient is on sliding scale at this point. Initiate patient on low-dose insulin Lantus 4. Hyperlipidemia unspecified on atorvastatin 5. History of gout on  allopurinol 6. Overflow incontinence- Dr. Fonnie Birkenhead has asked the nursing staff to stand him up every 4 hours with the urinal to have him urinate. With the ECT treatment ends up losing a lot of his urine so a condom catheter is needed. Now patient is to continue Flomax as well as finasteride for BPH 7. BPH- Flomax and finasteride, now complains 8. Constipation-  MiraLAX. Senna. When necessary lactulose if does not have a bowel movement today.  Code Status:     Code Status Orders        Start     Ordered   07/29/15 2027  Full code   Continuous     07/29/15 2028     Disposition Plan: To be determined  Consultants:  Psychiatry  Procedures:  ECT treatments  Time spent: 30 minutes  [facility]  [facility] Hospitalists

## 2015-08-21 NOTE — Progress Notes (Signed)
Physical Therapy Treatment Patient Details Name: Christian [name] MRN: [id number] DOB: 26-Feb-1951 Today's Date: 08/21/2015    History of Present Illness Pt is a 64 year old male with bipolar disorder with recent suicide attempt and agitation and threats to wife. Pt transfered to this unit for IV access while he recieives ECT treatment.     PT Comments    Pt was awake and sitting up in bed with his wife present upon arrival to his room for treatment. Pt did not show the typical amount of resistance to therapy and was generally agreeable to requests made of him today. Pt demonstrated multiple instances of impulsive behavior with regards to his transfers (from sit <>stand). Pt needs to be reminded not to stand up if a PT is not beside him guarding. Pt ambulated 50 ft x 2 (to the nurses station, rest break, and back to his room) with min assist +2. Pt tends to have a scissoring gait with narrow BOS and often times will step on his socks which creates a tripping hazard. Pt's wife advised that she can bring tennis shoes he can wear during his therapy sessions. Pt used the bathroom during therapy today. There was one instance of mod assist required when pt lost balance coming over the lip between his bathroom and bedroom. Overall, pt's general disposition was better today than it had been in the past and pt seems to be making good improvements with his functional mobility. Pt still requires skilled PT services in order to increase capacity for mobility, gait training, and functional strength.   Follow Up Recommendations  SNF     Equipment Recommendations  Rolling walker with 5" wheels    Recommendations for Other Services       Precautions / Restrictions Precautions Precautions: Fall Restrictions Weight Bearing Restrictions: No    Mobility  Bed Mobility Overal bed mobility: Modified Independent (used bed rails to come from supine with HOB elevated to sitting of EOB) Bed Mobility: Supine to  Sit     Supine to sit: Modified independent (Device/Increase time)     General bed mobility comments: pt displayed impulse when coming from supine to sitting and attempted to stand right away even with PT asking him to stay seated. Need to watch closely when transitioning pt to ensure safety.   Transfers Overall transfer level: Needs assistance Equipment used: Rolling walker (2 wheeled) Transfers: Sit to/from Stand Sit to Stand: Supervision         General transfer comment: pt was able to come from sitting to standing without PT assist and use of RW. PT had to hold walker down to ensure that it would not tip when pt put weight through it. Pt needs to be reminded on proper sit to stand technique using bed/arm rest of chair to push through in order to achieve standing posture.  Ambulation/Gait Ambulation/Gait assistance: Min assist (pt requires min assist at most times (walking to nurses station) . Pt did display a period of imbalance coming over lip in between bathroom and room; required mod assist to regain balance. ) Ambulation Distance (Feet): 100 Feet (50 ft x 2 ) Assistive device: Rolling walker (2 wheeled) Gait Pattern/deviations: Step-through pattern;Decreased step length - right;Decreased step length - left;Scissoring;Narrow base of support (pt tends to scissor and cross over midline when taking steps and tends to step on the sock of the opposite foot which creates a tripping hazard. Pt needs to be reminded to keep a more broad BOS frequently.)  Gait velocity interpretation: Below normal speed for age/gender     Stairs            Wheelchair Mobility    Modified Rankin (Stroke Patients Only)       Balance Overall balance assessment: Needs assistance Sitting-balance support: No upper extremity supported Sitting balance-Leahy Scale: Fair     Standing balance support: Bilateral upper extremity supported Standing balance-Leahy Scale: Fair                       Cognition Arousal/Alertness: Awake/alert Behavior During Therapy: Flat affect;Impulsive (pt attempted to stand without PT guard, needs to be reminded not to stand up abruptly.) Overall Cognitive Status: History of cognitive impairments - at baseline                      Exercises Other Exercises Other Exercises: sit to stand x 3 (one from each of the following surfaces: bed, recliner, toilet) w/ close PT supervision and use of BUE on RW.     General Comments        Pertinent Vitals/Pain Pain Assessment: No/denies pain    Home Living                      Prior Function            PT Goals (current goals can now be found in the care plan section) Additional Goals Additional Goal #1: Assess and establish goals for OOB activities as appropriate and patient allows. Progress towards PT goals: Progressing toward goals    Frequency  Min 2X/week    PT Plan Current plan remains appropriate    Co-evaluation             End of Session Equipment Utilized During Treatment: Gait belt Activity Tolerance: Patient tolerated treatment well Patient left: in chair;with call bell/phone within reach;with nursing/sitter in room;with family/visitor present     Time: 3716-9678 PT Time Calculation (min) (ACUTE ONLY): 14 min  Charges:                       G CodesGeorgina Peer 08/21/2015, 2:35 PM

## 2015-08-21 NOTE — Progress Notes (Signed)
Plan is for patient to transfer to BMU once medically stable. Patient ambulated in the hall way with PT today. Clinical Social Worker (CSW) will continue to follow and assist as needed.   Jetta Lout, LCSWA [phone]

## 2015-08-21 NOTE — Plan of Care (Signed)
Problem: Discharge Progression Outcomes Goal: Other Discharge Outcomes/Goals Outcome: Progressing Plan of care progress to goal Pt had ECT yesterday.  No c/o pain of discomfort No behavioral issues this shift. Sitter at bedside due to IVC.   Vital signs stable., no bowel movement this shift Patient ambulated to the nursing station with PT

## 2015-08-21 NOTE — Consult Note (Signed)
[facility] Face-to-Face Psychiatry Consult   Reason for Consult:  Consult for patient with severe major depression with psychotic features as part of bipolar disorder follow-up patient who is getting ECT Referring Physician:  [place] Patient Identification: Christian [name] MRN:  [id number] Principal Diagnosis: Bipolar disorder, current episode depressed, severe, with psychotic features Diagnosis:   Patient Active Problem List   Diagnosis Date Noted  . Pressure ulcer [L89.90] 07/30/2015  . Protein-calorie malnutrition, severe [E43] 07/30/2015  . Dehydration [E86.0] 07/29/2015  . Bipolar disorder, current episode depressed, severe, with psychotic features [F31.5] 07/09/2015  . Diabetes [E11.9] 07/09/2015  . UTI (urinary tract infection) [N39.0] 06/19/2014  . Positive blood culture [R78.81] 06/19/2014  . Bipolar affective disorder, current episode manic with psychotic symptoms [F31.2] 06/17/2014  . Type II or unspecified type diabetes mellitus with unspecified complication, uncontrolled [E11.8] 06/02/2014  . Other and unspecified hyperlipidemia [E78.5] 06/02/2014  . OSA on CPAP [G47.33] 06/02/2014  . 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 Gout [M10.9] 06/02/2014  . Hypertension [I10]     Total Time spent with patient: 20 minutes  Subjective:   Christian [name] is a 64 y.o. male patient admitted with patient reports he is feeling better. Eating well. Got up out of bed and is feeling stronger.  HPI:  Update as of Thursday. Patient continues to show improvement as far as mood and cognition. Energy level improving. Eating much better. Still fairly sluggish. His memory shows a little bit of impairment that he is alert and oriented and cooperative with treatment. HPI Elements:   Severity:  severe major depression gradually improving.  Past Medical History:  Past Medical History  Diagnosis Date  . Mood swings   . Type II or unspecified type  diabetes mellitus with unspecified complication, uncontrolled 06/02/2014  . Other and unspecified hyperlipidemia 06/02/2014  . OSA on CPAP 06/02/2014  . 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 Bipolar disorder   . CHF (congestive heart failure)   . CKD (chronic kidney disease), stage III   . Diabetes mellitus without complication   . Hypertension   . Gout   . Bipolar 1 disorder   . OSA on CPAP    History reviewed. No pertinent past surgical history. Family History:  Family History  Problem Relation Age of Onset  . Dementia Mother   . Arthritis Father   . Heart failure Neg Hx   . Kidney failure Neg Hx   . Cancer Neg Hx    Social History:  History  Alcohol Use No     History  Drug Use No    Social History   Social History  . Marital Status: Married    Spouse Name: N/A  . Number of Children: N/A  . Years of Education: N/A   Social History Main Topics  . Smoking status: Never Smoker   . Smokeless tobacco: None  . Alcohol Use: No  . Drug Use: No  . Sexual Activity: Not Currently   Other Topics Concern  . None   Social History Narrative   ** Merged History Encounter **       Additional Social History:                          Allergies:  No Known Allergies  Labs:  Results for orders placed or performed during the hospital encounter of 07/29/15 (from the past 48 hour(s))  Basic metabolic panel     Status: Abnormal   Collection Time: 08/20/15  4:43 AM  Result Value Ref Range   Sodium 138 135 - 145 mmol/L   Potassium 4.1 3.5 - 5.1 mmol/L   Chloride 109 101 - 111 mmol/L   CO2 26 22 - 32 mmol/L   Glucose, Bld 144 (H) 65 - 99 mg/dL   BUN 18 6 - 20 mg/dL   Creatinine, Ser 0.87 0.61 - 1.24 mg/dL   Calcium 8.5 (L) 8.9 - 10.3 mg/dL   GFR calc non Af Amer >60 >60 mL/min   GFR calc Af Amer >60 >60 mL/min    Comment: (NOTE) The eGFR has been calculated using the CKD EPI equation. This calculation has not been validated  in all clinical situations. eGFR's persistently <60 mL/min signify possible Chronic Kidney Disease.    Anion gap 3 (L) 5 - 15  Glucose, capillary     Status: Abnormal   Collection Time: 08/20/15  7:08 AM  Result Value Ref Range   Glucose-Capillary 125 (H) 65 - 99 mg/dL  Glucose, capillary     Status: Abnormal   Collection Time: 08/20/15  1:25 PM  Result Value Ref Range   Glucose-Capillary 145 (H) 65 - 99 mg/dL  Glucose, capillary     Status: Abnormal   Collection Time: 08/20/15  4:41 PM  Result Value Ref Range   Glucose-Capillary 282 (H) 65 - 99 mg/dL  Glucose, capillary     Status: Abnormal   Collection Time: 08/20/15  9:03 PM  Result Value Ref Range   Glucose-Capillary 203 (H) 65 - 99 mg/dL  Glucose, capillary     Status: Abnormal   Collection Time: 08/21/15  7:11 AM  Result Value Ref Range   Glucose-Capillary 175 (H) 65 - 99 mg/dL  Glucose, capillary     Status: Abnormal   Collection Time: 08/21/15 11:16 AM  Result Value Ref Range   Glucose-Capillary 237 (H) 65 - 99 mg/dL   Comment 1 Notify RN   Glucose, capillary     Status: Abnormal   Collection Time: 08/21/15  4:25 PM  Result Value Ref Range   Glucose-Capillary 209 (H) 65 - 99 mg/dL    Vitals: Blood pressure 129/78, pulse 94, temperature 99.1 F (37.3 C), temperature source Oral, resp. rate 20, height 6' (1.829 m), weight 93.895 kg (207 lb), SpO2 100 %.  Risk to Self: Is patient at risk for suicide?: Yes Risk to Others:   Prior Inpatient Therapy:   Prior Outpatient Therapy:    Current Facility-Administered Medications  Medication Dose Route Frequency Provider Last Rate Last Dose  . 0.9 %  sodium chloride infusion   Intravenous Continuous Epifanio Lesches, MD 50 mL/hr at 08/21/15 1212    . acetaminophen (TYLENOL) tablet 650 mg  650 mg Oral Q6H PRN [name], MD   650 mg at 07/31/15 3220   Or  . acetaminophen (TYLENOL) suppository 650 mg  650 mg Rectal Q6H PRN [name], MD      . allopurinol  (ZYLOPRIM) tablet 300 mg  300 mg Oral Daily [name], MD   300 mg at 08/21/15 1132  . atorvastatin (LIPITOR) tablet 10 mg  10 mg Oral Daily [name], MD   10 mg at 08/21/15 1132  . cholecalciferol (VITAMIN D) tablet 800 Units  800 Units Oral Daily [name], MD   800 Units at 08/21/15 1130  . clonazePAM (KLONOPIN) tablet 1 mg  1 mg  Oral QHS [name], MD   1 mg at 08/20/15 2100  . colchicine tablet 0.6 mg  0.6 mg Oral Daily [name], MD   0.6 mg at 08/21/15 1212  . diltiazem (CARDIZEM CD) 24 hr capsule 120 mg  120 mg Oral Daily Max Sane, MD   120 mg at 08/21/15 1132  . docusate sodium (COLACE) capsule 200 mg  200 mg Oral BID Dustin Flock, MD   200 mg at 08/21/15 1129  . enoxaparin (LOVENOX) injection 40 mg  40 mg Subcutaneous Q24H [name], MD   40 mg at 08/20/15 2100  . feeding supplement (GLUCERNA SHAKE) (GLUCERNA SHAKE) liquid 237 mL  237 mL Oral TID WC [name], MD   237 mL at 08/21/15 1200  . finasteride (PROSCAR) tablet 5 mg  5 mg Oral Daily Loletha Grayer, MD   5 mg at 08/21/15 1129  . hydrALAZINE (APRESOLINE) tablet 50 mg  50 mg Oral 4 times per day Epifanio Lesches, MD   50 mg at 08/21/15 1337  . insulin aspart (novoLOG) injection 0-9 Units  0-9 Units Subcutaneous TID WC Dustin Flock, MD   3 Units at 08/21/15 1648  . insulin glargine (LANTUS) injection 5 Units  5 Units Subcutaneous Daily Theodoro Grist, MD   5 Units at 08/21/15 1435  . lactulose (CHRONULAC) 10 GM/15ML solution 30 g  30 g Oral Daily PRN Loletha Grayer, MD      . lidocaine (cardiac) 100 mg/52m (XYLOCAINE) 20 MG/ML injection 2% 4 mg  4 mg Intravenous Once JGonzella Lex MD      . lisinopril (PRINIVIL,ZESTRIL) tablet 10 mg  10 mg Oral Daily RLoletha Grayer MD   10 mg at 08/21/15 1131  . megestrol (MEGACE) 400 MG/10ML suspension 400 mg  400 mg Oral Daily VHenreitta Leber MD   400 mg at 08/21/15 1133  . metoCLOPramide (REGLAN) injection 10 mg  10 mg Intravenous Once JGonzella Lex MD   10 mg at 08/01/15 1300  . metoprolol succinate (TOPROL-XL) 24 hr tablet 200 mg  200 mg Oral Daily VHenreitta Leber MD   200 mg at 08/21/15 1129  . mirtazapine (REMERON) tablet 45 mg  45 mg Oral QHS JGonzella Lex MD   45 mg at 08/20/15 2100  . OLANZapine zydis (ZYPREXA) disintegrating tablet 30 mg  30 mg Oral QHS JGonzella Lex MD   30 mg at 08/20/15 2100  . ondansetron (ZOFRAN) tablet 4 mg  4 mg Oral Q6H PRN VHenreitta Leber MD       Or  . ondansetron (ZOFRAN) injection 4 mg  4 mg Intravenous Q6H PRN VHenreitta Leber MD      . polyethylene glycol (MIRALAX / GLYCOLAX) packet 17 g  17 g Oral Daily SEpifanio Lesches MD   17 g at 08/21/15 1000  . senna (SENOKOT) tablet 8.6 mg  1 tablet Oral Daily SDustin Flock MD   8.6 mg at 08/21/15 1132  . sodium chloride 0.9 % bolus 1,000 mL  1,000 mL Intravenous PRN DLance Coon MD   1,000 mL at 08/15/15 2218  . tamsulosin (FLOMAX) capsule 0.4 mg  0.4 mg Oral QHS RLoletha Grayer MD   0.4 mg at 08/20/15 2100  . traZODone (DESYREL) tablet 25 mg  25 mg Oral TID VHenreitta Leber MD   25 mg at 08/21/15 1649    Musculoskeletal: Strength & Muscle Tone: decreased Gait & Station: unsteady Patient leans: N/A  Psychiatric  Specialty Exam: Physical Exam  Nursing note and vitals reviewed. Constitutional: He appears well-developed and well-nourished.  HENT:  Head: Normocephalic and atraumatic.  Eyes: Conjunctivae are normal. Pupils are equal, round, and reactive to light.  Neck: Normal range of motion.  Cardiovascular: Normal heart sounds.   Respiratory: Effort normal.  GI: Soft.  Musculoskeletal: Normal range of motion.  Neurological: He is alert.  Skin: Skin is warm and dry.  Psychiatric: He has a normal mood and affect. Judgment and thought content normal. His speech is delayed. He is slowed. Cognition and memory are normal.    Review of Systems  Constitutional: Negative.   HENT: Negative.   Eyes: Negative.   Respiratory:  Negative.   Cardiovascular: Negative.   Gastrointestinal: Negative.   Musculoskeletal: Negative.   Skin: Negative.   Neurological: Negative.   Psychiatric/Behavioral: Positive for memory loss. Negative for depression, suicidal ideas, hallucinations and substance abuse. The patient is not nervous/anxious and does not have insomnia.     Blood pressure 129/78, pulse 94, temperature 99.1 F (37.3 C), temperature source Oral, resp. rate 20, height 6' (1.829 m), weight 93.895 kg (207 lb), SpO2 100 %.Body mass index is 28.07 kg/(m^2).  General Appearance: Disheveled  Eye Sport and exercise psychologist::  Fair  Speech:  Normal Rate  Volume:  Decreased  Mood:  Depressed  Affect:  Flat  Thought Process:  Linear  Orientation:  Full (Time, Place, and Person)  Thought Content:  Negative  Suicidal Thoughts:  No  Homicidal Thoughts:  No  Memory:  Immediate;   Fair Recent;   Fair Remote;   Good  Judgement:  Fair  Insight:  Fair  Psychomotor Activity:  Decreased  Concentration:  Poor  Recall:  [employer] of Knowledge:Fair  Language: Fair  Akathisia:  No  Handed:  Right  AIMS (if indicated):     Assets:  Communication Skills Desire for Improvement Financial Resources/Insurance Housing Social Support  ADL's:  Impaired  Cognition: Impaired,  Mild  Sleep:      Medical Decision Making: Established Problem, Stable/Improving (1), Review of Psycho-Social Stressors (1), Review or order clinical lab tests (1) and Review of Medication Regimen & Side Effects (2)  Treatment Plan Summary: Medication management and Plan continue current management with antidepressives and an anti-psychotic as well as 3 times a week bilateral ECT. Reviewed patient plan with the patient. Next treatment tomorrow morning. 9 stable.  Plan:  continued ECT Disposition: continued ECT ECT Alethia Berthold 08/21/2015 5:59 PM

## 2015-08-22 ENCOUNTER — Inpatient Hospital Stay: Payer: Commercial Managed Care - HMO | Admitting: Anesthesiology

## 2015-08-22 ENCOUNTER — Other Ambulatory Visit: Payer: Self-pay | Admitting: *Deleted

## 2015-08-22 ENCOUNTER — Inpatient Hospital Stay: Payer: Commercial Managed Care - HMO

## 2015-08-22 ENCOUNTER — Encounter: Payer: Self-pay | Admitting: Anesthesiology

## 2015-08-22 DIAGNOSIS — Z6828 Body mass index (BMI) 28.0-28.9, adult: Secondary | ICD-10-CM | POA: Diagnosis not present

## 2015-08-22 DIAGNOSIS — E669 Obesity, unspecified: Secondary | ICD-10-CM | POA: Diagnosis not present

## 2015-08-22 DIAGNOSIS — F315 Bipolar disorder, current episode depressed, severe, with psychotic features: Secondary | ICD-10-CM | POA: Diagnosis not present

## 2015-08-22 DIAGNOSIS — I509 Heart failure, unspecified: Secondary | ICD-10-CM | POA: Diagnosis not present

## 2015-08-22 LAB — GLUCOSE, CAPILLARY
Glucose-Capillary: 141 mg/dL — ABNORMAL HIGH (ref 65–99)
Glucose-Capillary: 156 mg/dL — ABNORMAL HIGH (ref 65–99)
Glucose-Capillary: 309 mg/dL — ABNORMAL HIGH (ref 65–99)
Glucose-Capillary: 211 mg/dL — ABNORMAL HIGH (ref 65–99)

## 2015-08-22 MED ORDER — SUCCINYLCHOLINE CHLORIDE 20 MG/ML IJ SOLN
120.0000 mg | Freq: Once | INTRAMUSCULAR | Status: AC
  Administered 2015-08-22: 120 mg via INTRAVENOUS

## 2015-08-22 MED ORDER — SODIUM CHLORIDE 0.9 % IV SOLN: 250.0000 mL | Freq: Once | INTRAVENOUS | Status: DC

## 2015-08-22 MED ORDER — LABETALOL HCL 5 MG/ML IV SOLN
20.0000 mg | Freq: Once | INTRAVENOUS | Status: AC
  Administered 2015-08-22: 20 mg via INTRAVENOUS

## 2015-08-22 MED ORDER — LABETALOL HCL 5 MG/ML IV SOLN
20.0000 mg | Freq: Once | INTRAVENOUS | Status: AC
  Administered 2015-08-22: 12:00:00 20 mg via INTRAVENOUS

## 2015-08-22 MED ORDER — KETAMINE HCL 10 MG/ML IJ SOLN
120.0000 mg | Freq: Once | INTRAMUSCULAR | Status: AC
  Administered 2015-08-22: 12:00:00 120 mg via INTRAVENOUS

## 2015-08-22 MED ORDER — LIDOCAINE HCL (CARDIAC) 20 MG/ML IV SOLN
4.0000 mg | Freq: Once | INTRAVENOUS | Status: AC
  Administered 2015-08-22: 12:00:00 4 mg via INTRAVENOUS

## 2015-08-22 MED ORDER — NITROGLYCERIN 1 MG/10 ML FOR IR/CATH LAB
100.0000 ug | Freq: Once | INTRA_ARTERIAL | Status: AC
  Administered 2015-08-22: 100 ug via INTRAVENOUS

## 2015-08-22 NOTE — Progress Notes (Signed)
Inpatient Diabetes Program Recommendations  AACE/ADA: New Consensus Statement on Inpatient Glycemic Control (2015)  Target Ranges:  Prepandial:   less than 140 mg/dL      Peak postprandial:   less than 180 mg/dL (1-2 hours)      Critically ill patients:  140 - 180 mg/dL   Results for MADDOCK, Christian [name] (MRN [id number]) as of 08/22/2015 13:30  Ref. Range 08/21/2015 11:16 08/21/2015 16:25 08/21/2015 21:49 08/22/2015 07:50 08/22/2015 13:21  Glucose-Capillary Latest Ref Range: 65-99 mg/dL 950 (H) 932 (H) 671 (H) 141 (H) 156 (H)    Review of Glycemic Control  Diabetes history: Type 2, A1C 7.5% on 07/11/15 Outpatient Diabetes medications: Glucotrol 5mg  qday Current orders for Inpatient glycemic control: Novolog 0-9 units tid with meals  Inpatient Diabetes Program Recommendations: Post meal blood sugars remain elevated with current dose of Novolog 0-9 units tid.  Consider increasing Novolog to moderate correction scale 0-15 units tid.  Susette Racer, RN, BA, MHA, CDE Diabetes Coordinator Inpatient Diabetes Program  [phone] (Team Pager) [phone] [facility] Office) 08/22/2015 1:30 PM

## 2015-08-22 NOTE — Anesthesia Preprocedure Evaluation (Signed)
Anesthesia Evaluation  Patient identified by MRN, date of birth, ID band Patient confused    Reviewed: Allergy & Precautions, H&P , NPO status , Patient's Chart, lab work & pertinent test results  History of Anesthesia Complications Negative for: history of anesthetic complications  Airway Mallampati: III       Dental  (+) Poor Dentition   Pulmonary sleep apnea ,     + decreased breath sounds      Cardiovascular hypertension, Pt. on medications +CHF  Normal cardiovascular exam     Neuro/Psych PSYCHIATRIC DISORDERS (bipolar 1) negative neurological ROS     GI/Hepatic negative GI ROS, Neg liver ROS,   Endo/Other  diabetes, Type 1, Insulin Dependent  Renal/GU CRFRenal diseasenegative Renal ROS  negative genitourinary   Musculoskeletal negative musculoskeletal ROS (+)   Abdominal (+) + obese,   Peds  Hematology negative hematology ROS (+)   Anesthesia Other Findings Past Medical History:   Mood swings                                                  Type II or unspecified type diabetes mellitus * 06/02/2014     Other and unspecified hyperlipidemia            06/02/2014     OSA on CPAP                                     06/02/2014     Bilateral lower extremity edema: chronic with * 06/02/2014     Bipolar disorder                                             CHF (congestive heart failure)                               CKD (chronic kidney disease), stage III                      Diabetes mellitus without complication                       Hypertension                                                 Gout                                                         Bipolar 1 disorder                                           OSA on CPAP  Reproductive/Obstetrics negative OB ROS                             Anesthesia Physical  Anesthesia Plan  ASA:  III  Anesthesia Plan: General   Post-op Pain Management:    Induction: Intravenous  Airway Management Planned: Mask  Additional Equipment:   Intra-op Plan:   Post-operative Plan:   Informed Consent: I have reviewed the patients History and Physical, chart, labs and discussed the procedure including the risks, benefits and alternatives for the proposed anesthesia with the patient or authorized representative who has indicated his/her understanding and acceptance.   Dental Advisory Given  Plan Discussed with: Anesthesiologist, CRNA and Surgeon  Anesthesia Plan Comments:         Anesthesia Quick Evaluation

## 2015-08-22 NOTE — Procedures (Addendum)
ECT SERVICES Physician's Interval Evaluation & Treatment Note  Patient Identification: Christian [name] MRN:  [id number] Date of Evaluation:  08/22/2015 TX #: [id number]  MADRS:   MMSE:   P.E. Findings:  No change to physical exam  Psychiatric Interval Note:  Mood and affect improving. Cognition improving. Walking better.  Subjective:  Patient is a 64 y.o. male seen for evaluation for Electroconvulsive Therapy. Patient admits to feeling better  Treatment Summary:   []   Right Unilateral             [x]  Bilateral   % Energy : 1.0 ms, 100%   Impedance: 630 ohms  Seizure Energy Index: 714 V squared  Postictal Suppression Index: 14%  Seizure Concordance Index: 94%  Medications  Pre Shock: Xylocaine 4 mg, labetalol 20 mg, ketamine 120 mg, succinylcholine 120 mg  Post Shock: Labetalol 20 mg, nitroglycerin 100 g Seizure Duration: 17 seconds by EMG, 20 seconds by EEG   Comments: Seizure length and indices are getting lower. Machine is although a turned up and we are using ketamine. Attempting to hyperventilate. Patient is clearly still on the up word course. Continue current medication and treatment. Treatment 3 times a week into next week.   Lungs:  [x]   Clear to auscultation               []  Other:   Heart:    [x]   Regular rhythm             []  irregular rhythm    [x]   Previous H&P reviewed, patient examined and there are NO CHANGES                 []   Previous H&P reviewed, patient examined and there are changes noted.   [name], MD 9/23/201611:57 AM

## 2015-08-22 NOTE — Plan of Care (Signed)
Problem: Discharge Progression Outcomes Goal: Other Discharge Outcomes/Goals Outcome: Progressing Patient had no c/o pain this shift VSS Patient had ECT today and tolerated procedure well  Patient is tolerating diet and eating well  PT again worked with patient today

## 2015-08-22 NOTE — Care Management Important Message (Signed)
Important Message  Patient Details  Name: Christian [name] MRN: [id number] Date of Birth: 11/10/1951   Medicare Important Message Given:  Yes-second notification given    [name] 08/22/2015, 9:07 AM

## 2015-08-22 NOTE — Consult Note (Signed)
[facility] Face-to-Face Psychiatry Consult   Reason for Consult:  Consult for patient with severe major depression with psychotic features as part of bipolar disorder follow-up patient who is getting ECT Referring Physician:  [place] Patient Identification: Christian [name] MRN:  [id number] Principal Diagnosis: Bipolar disorder, current episode depressed, severe, with psychotic features Diagnosis:   Patient Active Problem List   Diagnosis Date Noted  . Pressure ulcer [L89.90] 07/30/2015  . Protein-calorie malnutrition, severe [E43] 07/30/2015  . Dehydration [E86.0] 07/29/2015  . Bipolar disorder, current episode depressed, severe, with psychotic features [F31.5] 07/09/2015  . Diabetes [E11.9] 07/09/2015  . UTI (urinary tract infection) [N39.0] 06/19/2014  . Positive blood culture [R78.81] 06/19/2014  . Bipolar affective disorder, current episode manic with psychotic symptoms [F31.2] 06/17/2014  . Type II or unspecified type diabetes mellitus with unspecified complication, uncontrolled [E11.8] 06/02/2014  . Other and unspecified hyperlipidemia [E78.5] 06/02/2014  . OSA on CPAP [G47.33] 06/02/2014  . 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 Gout [M10.9] 06/02/2014  . Hypertension [I10]     Total Time spent with patient: 20 minutes  Subjective:   Corday Valek Eichler is a 64 y.o. male patient admitted with patient reports he is feeling better. Eating well. Got up out of bed and is feeling stronger.  HPI: Update as of Friday the 23rd. Patient once again had bilateral ECT today. I believe this was his 11th treatment. Patient continues to show gradual improvement. On interview today he is awake and alert and interactive. Affect is almost smiling. Slow in his thinking and doesn't do much spontaneously but has been getting up and walking with physical therapy. Eating better. Doesn't show acute signs of psychosis. No longer is talking about being dead  already. Denies any current suicidal ideation. Patient has responded well to ECT although he is still functionally quite off his baseline. Tolerating medicines otherwise well. Vital signs reviewed blood pressure is okay. Blood sugars staying stable.  HPI Elements:   Severity:  severe major depression gradually improving.  Past Medical History:  Past Medical History  Diagnosis Date  . Mood swings   . Type II or unspecified type diabetes mellitus with unspecified complication, uncontrolled 06/02/2014  . Other and unspecified hyperlipidemia 06/02/2014  . OSA on CPAP 06/02/2014  . 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 Bipolar disorder   . CHF (congestive heart failure)   . CKD (chronic kidney disease), stage III   . Diabetes mellitus without complication   . Hypertension   . Gout   . Bipolar 1 disorder   . OSA on CPAP    History reviewed. No pertinent past surgical history. Family History:  Family History  Problem Relation Age of Onset  . Dementia Mother   . Arthritis Father   . Heart failure Neg Hx   . Kidney failure Neg Hx   . Cancer Neg Hx    Social History:  History  Alcohol Use No     History  Drug Use No    Social History   Social History  . Marital Status: Married    Spouse Name: N/A  . Number of Children: N/A  . Years of Education: N/A   Social History Main Topics  . Smoking status: Never Smoker   . Smokeless tobacco: None  . Alcohol Use: No  . Drug Use: No  . Sexual Activity: Not Currently   Other Topics Concern  . None   Social History  Narrative   ** Merged History Encounter **       Additional Social History:                          Allergies:  No Known Allergies  Labs:  Results for orders placed or performed during the hospital encounter of 07/29/15 (from the past 48 hour(s))  Glucose, capillary     Status: Abnormal   Collection Time: 08/20/15  9:03 PM  Result Value Ref Range   Glucose-Capillary  203 (H) 65 - 99 mg/dL  Glucose, capillary     Status: Abnormal   Collection Time: 08/21/15  7:11 AM  Result Value Ref Range   Glucose-Capillary 175 (H) 65 - 99 mg/dL  Glucose, capillary     Status: Abnormal   Collection Time: 08/21/15 11:16 AM  Result Value Ref Range   Glucose-Capillary 237 (H) 65 - 99 mg/dL   Comment 1 Notify RN   Glucose, capillary     Status: Abnormal   Collection Time: 08/21/15  4:25 PM  Result Value Ref Range   Glucose-Capillary 209 (H) 65 - 99 mg/dL  Glucose, capillary     Status: Abnormal   Collection Time: 08/21/15  9:49 PM  Result Value Ref Range   Glucose-Capillary 264 (H) 65 - 99 mg/dL  Glucose, capillary     Status: Abnormal   Collection Time: 08/22/15  7:50 AM  Result Value Ref Range   Glucose-Capillary 141 (H) 65 - 99 mg/dL  Glucose, capillary     Status: Abnormal   Collection Time: 08/22/15  1:21 PM  Result Value Ref Range   Glucose-Capillary 156 (H) 65 - 99 mg/dL  Glucose, capillary     Status: Abnormal   Collection Time: 08/22/15  4:26 PM  Result Value Ref Range   Glucose-Capillary 309 (H) 65 - 99 mg/dL    Vitals: Blood pressure 133/66, pulse 85, temperature 98.2 F (36.8 C), temperature source Axillary, resp. rate 18, height 6' (1.829 m), weight 93.895 kg (207 lb), SpO2 100 %.  Risk to Self: Is patient at risk for suicide?: Yes Risk to Others:   Prior Inpatient Therapy:   Prior Outpatient Therapy:    Current Facility-Administered Medications  Medication Dose Route Frequency Provider Last Rate Last Dose  . 0.9 %  sodium chloride infusion   Intravenous Continuous Katha Hamming, MD 50 mL/hr at 08/22/15 0641    . 0.9 %  sodium chloride infusion  250 mL Intravenous Once Audery Amel, MD      . acetaminophen (TYLENOL) tablet 650 mg  650 mg Oral Q6H PRN Houston Siren, MD   650 mg at 07/31/15 4098   Or  . acetaminophen (TYLENOL) suppository 650 mg  650 mg Rectal Q6H PRN Houston Siren, MD      . allopurinol (ZYLOPRIM) tablet 300 mg   300 mg Oral Daily Houston Siren, MD   300 mg at 08/22/15 1355  . atorvastatin (LIPITOR) tablet 10 mg  10 mg Oral Daily Houston Siren, MD   10 mg at 08/22/15 1354  . cholecalciferol (VITAMIN D) tablet 800 Units  800 Units Oral Daily Houston Siren, MD   800 Units at 08/22/15 1356  . clonazePAM (KLONOPIN) tablet 1 mg  1 mg Oral QHS Houston Siren, MD   1 mg at 08/21/15 2108  . colchicine tablet 0.6 mg  0.6 mg Oral Daily Houston Siren, MD   0.6 mg at 08/22/15  1354  . diltiazem (CARDIZEM CD) 24 hr capsule 120 mg  120 mg Oral Daily Delfino Lovett, MD   120 mg at 08/22/15 0903  . docusate sodium (COLACE) capsule 200 mg  200 mg Oral BID Auburn Bilberry, MD   200 mg at 08/22/15 1355  . enoxaparin (LOVENOX) injection 40 mg  40 mg Subcutaneous Q24H Houston Siren, MD   40 mg at 08/21/15 2107  . feeding supplement (GLUCERNA SHAKE) (GLUCERNA SHAKE) liquid 237 mL  237 mL Oral TID WC Houston Siren, MD   237 mL at 08/22/15 1700  . finasteride (PROSCAR) tablet 5 mg  5 mg Oral Daily Alford Highland, MD   5 mg at 08/22/15 1357  . hydrALAZINE (APRESOLINE) tablet 50 mg  50 mg Oral 4 times per day Katha Hamming, MD   50 mg at 08/22/15 1837  . insulin aspart (novoLOG) injection 0-9 Units  0-9 Units Subcutaneous TID WC Auburn Bilberry, MD   7 Units at 08/22/15 1758  . insulin glargine (LANTUS) injection 5 Units  5 Units Subcutaneous Daily Katharina Caper, MD   5 Units at 08/22/15 1750  . lactulose (CHRONULAC) 10 GM/15ML solution 30 g  30 g Oral Daily PRN Alford Highland, MD   30 g at 08/21/15 2108  . lidocaine (cardiac) 100 mg/56ml (XYLOCAINE) 20 MG/ML injection 2% 4 mg  4 mg Intravenous Once Audery Amel, MD      . lisinopril (PRINIVIL,ZESTRIL) tablet 10 mg  10 mg Oral Daily Alford Highland, MD   10 mg at 08/22/15 0909  . megestrol (MEGACE) 400 MG/10ML suspension 400 mg  400 mg Oral Daily Houston Siren, MD   400 mg at 08/22/15 1353  . metoCLOPramide (REGLAN) injection 10 mg  10 mg Intravenous Once Audery Amel, MD   10 mg at 08/01/15 1300  . metoprolol succinate (TOPROL-XL) 24 hr tablet 200 mg  200 mg Oral Daily Houston Siren, MD   200 mg at 08/22/15 0903  . mirtazapine (REMERON) tablet 45 mg  45 mg Oral QHS Audery Amel, MD   45 mg at 08/21/15 2114  . OLANZapine zydis (ZYPREXA) disintegrating tablet 30 mg  30 mg Oral QHS Audery Amel, MD   30 mg at 08/21/15 2107  . ondansetron (ZOFRAN) tablet 4 mg  4 mg Oral Q6H PRN Houston Siren, MD       Or  . ondansetron (ZOFRAN) injection 4 mg  4 mg Intravenous Q6H PRN Houston Siren, MD      . polyethylene glycol (MIRALAX / GLYCOLAX) packet 17 g  17 g Oral Daily Katha Hamming, MD   17 g at 08/22/15 1353  . senna (SENOKOT) tablet 8.6 mg  1 tablet Oral Daily Auburn Bilberry, MD   8.6 mg at 08/22/15 1355  . sodium chloride 0.9 % bolus 1,000 mL  1,000 mL Intravenous PRN Oralia Manis, MD   1,000 mL at 08/15/15 2218  . tamsulosin (FLOMAX) capsule 0.4 mg  0.4 mg Oral QHS Alford Highland, MD   0.4 mg at 08/21/15 2108  . traZODone (DESYREL) tablet 25 mg  25 mg Oral TID Houston Siren, MD   25 mg at 08/22/15 1625    Musculoskeletal: Strength & Muscle Tone: decreased Gait & Station: unsteady Patient leans: N/A  Psychiatric Specialty Exam: Physical Exam  Nursing note and vitals reviewed. Constitutional: He appears well-developed and well-nourished.  HENT:  Head: Normocephalic and atraumatic.  Eyes: Conjunctivae are normal. Pupils are equal,  round, and reactive to light.  Neck: Normal range of motion.  Cardiovascular: Normal heart sounds.   Respiratory: Effort normal.  GI: Soft.  Musculoskeletal: Normal range of motion.  Neurological: He is alert.  Skin: Skin is warm and dry.  Psychiatric: He has a normal mood and affect. Judgment and thought content normal. His speech is delayed. He is slowed. Cognition and memory are normal.    Review of Systems  Constitutional: Negative.   HENT: Negative.   Eyes: Negative.   Respiratory: Negative.    Cardiovascular: Negative.   Gastrointestinal: Negative.   Musculoskeletal: Negative.   Skin: Negative.   Neurological: Negative.   Psychiatric/Behavioral: Positive for memory loss. Negative for depression, suicidal ideas, hallucinations and substance abuse. The patient is not nervous/anxious and does not have insomnia.     Blood pressure 133/66, pulse 85, temperature 98.2 F (36.8 C), temperature source Axillary, resp. rate 18, height 6' (1.829 m), weight 93.895 kg (207 lb), SpO2 100 %.Body mass index is 28.07 kg/(m^2).  General Appearance: Disheveled  Eye Solicitor::  Fair  Speech:  Normal Rate  Volume:  Decreased  Mood:  Depressed  Affect:  Flat  Thought Process:  Linear  Orientation:  Full (Time, Place, and Person)  Thought Content:  Negative  Suicidal Thoughts:  No  Homicidal Thoughts:  No  Memory:  Immediate;   Fair Recent;   Fair Remote;   Good  Judgement:  Fair  Insight:  Fair  Psychomotor Activity:  Decreased  Concentration:  Poor  Recall:  [employer] of Knowledge:Fair  Language: Fair  Akathisia:  No  Handed:  Right  AIMS (if indicated):     Assets:  Communication Skills Desire for Improvement Financial Resources/Insurance Housing Social Support  ADL's:  Impaired  Cognition: Impaired,  Mild  Sleep:      Medical Decision Making: Established Problem, Stable/Improving (1), Review of Psycho-Social Stressors (1), Review or order clinical lab tests (1) and Review of Medication Regimen & Side Effects (2)  Treatment Plan Summary: Medication management and Plan Continue current medication with antidepressives and anti-psychotics. Appreciate continued assistance from internal medicine. Next ECT scheduled for Monday and into next week.  Plan:  continued ECT Disposition: continued ECT ECT [name] 08/22/2015 6:59 PM

## 2015-08-22 NOTE — Progress Notes (Signed)
Physical Therapy Treatment Patient Details Name: Christian [name] MRN: [id number] DOB: August 09, 1951 Today's Date: 08/22/2015    History of Present Illness Pt is a 64 year old male with bipolar disorder with recent suicide attempt and agitation and threats to wife. Pt transfered to this unit for IV access while he recieives ECT treatment.     PT Comments    Pt's mood and willingness to cooperate with therapy continues to increase with each session. Pt able to ambulate x 150 ft w/ RW and min assist +1. Cues required for pt to maintain wider BOS with gait to avoid scissoring over midline. Pt able to walk faster than baseline with command to "walk as fast as you can from here to your room". Pt did not display instances of imbalance with this increased gait speed. Pt performed 5XSTS objective measure twice today; 23 seconds with BUE support on RW and 26 seconds when no UE support used. These values demonstrate that the pt still has function LE strength deficits and is at an increased risk of falls. Pt will benefit from increasing functional mobility demands in future session; next session pt should walk all the way around nursing station and back to his room. Pt will also benefit from increasingly difficult therapeutic exercise/acitvity in order to increase functional gains.   Follow Up Recommendations  SNF     Equipment Recommendations  Rolling walker with 5" wheels    Recommendations for Other Services       Precautions / Restrictions Precautions Precautions: Fall Restrictions Weight Bearing Restrictions: No    Mobility  Bed Mobility Overal bed mobility: Modified Independent Bed Mobility: Supine to Sit     Supine to sit: Modified independent (Device/Increase time) (used bed rails for assistance)     General bed mobility comments: pt displayed impulse when coming from supine to sitting and attempted to stand right away even with PT asking him to stay seated. Need to watch closely when  transitioning pt to ensure safety.   Transfers Overall transfer level: Needs assistance Equipment used: Rolling walker (2 wheeled) Transfers: Sit to/from Stand Sit to Stand: Supervision         General transfer comment: pt was able to come from sitting to standing without PT assist and use of RW. PT had to hold walker down to ensure that it would not tip when pt put weight through it. Pt needs to be reminded on proper sit to stand technique using bed/arm rest of chair to push through in order to achieve standing posture.  Ambulation/Gait Ambulation/Gait assistance: Min assist Ambulation Distance (Feet): 150 Feet Assistive device: Rolling walker (2 wheeled) Gait Pattern/deviations: Step-through pattern;Decreased step length - right;Decreased step length - left;Scissoring;Narrow base of support   Gait velocity interpretation: <1.8 ft/sec, indicative of risk for recurrent falls General Gait Details: Pt did better with keeping feet separated and not crossing over midline as much today. Pt still requires constant vc for this.    Stairs            Wheelchair Mobility    Modified Rankin (Stroke Patients Only)       Balance Overall balance assessment: Needs assistance Sitting-balance support: No upper extremity supported Sitting balance-Leahy Scale: Good     Standing balance support: Bilateral upper extremity supported Standing balance-Leahy Scale: Fair Standing balance comment: mulitple instances of posterior and lateral lean during stance that pt was able to self correct.  Cognition Arousal/Alertness: Awake/alert Behavior During Therapy: Flat affect;Impulsive (Pt very impulsive today) Overall Cognitive Status: History of cognitive impairments - at baseline                      Exercises Total Joint Exercises Marching in Standing: Strengthening;10 reps;Both;Standing Other Exercises Other Exercises: With PT supervision: heel raises  w/ BUE support on RW: x 10 (small amplitude of movement) , mini squats w/ BUE support on RW x 10    General Comments        Pertinent Vitals/Pain Pain Assessment: No/denies pain    Home Living                      Prior Function            PT Goals (current goals can now be found in the care plan section) Acute Rehab PT Goals Patient Stated Goal: patient unable to verbalize PT Goal Formulation: With patient Time For Goal Achievement: 07/24/15 Potential to Achieve Goals: Good Additional Goals Additional Goal #1: Assess and establish goals for OOB activities as appropriate and patient allows. Progress towards PT goals: Progressing toward goals    Frequency  Min 2X/week    PT Plan Current plan remains appropriate    Co-evaluation             End of Session Equipment Utilized During Treatment: Gait belt Activity Tolerance: Patient tolerated treatment well Patient left: in chair;with nursing/sitter in room;with call bell/phone within reach     Time: 1610-9604 PT Time Calculation (min) (ACUTE ONLY): 24 min  Charges:                       G Codes:      Christian [name], SPT 08/22/2015, 4:07 PM

## 2015-08-22 NOTE — Progress Notes (Signed)
Per Irene Limbo in ECT give patient Hydralizine, Metoprolol and Cardizem prior to ECT

## 2015-08-22 NOTE — H&P (Signed)
Christian [name] is an 64 y.o. male.   Chief Complaint: Continued index course of ECT using bilateral treatment for severe depression as part of bipolar disorder HPI: Patient continues to show improvement in affect subjective mood and energy and cognition  Past Medical History  Diagnosis Date  . Mood swings   . Type II or unspecified type diabetes mellitus with unspecified complication, uncontrolled 06/02/2014  . Other and unspecified hyperlipidemia 06/02/2014  . OSA on CPAP 06/02/2014  . 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 Bipolar disorder   . CHF (congestive heart failure)   . CKD (chronic kidney disease), stage III   . Diabetes mellitus without complication   . Hypertension   . Gout   . Bipolar 1 disorder   . OSA on CPAP     History reviewed. No pertinent past surgical history.  Family History  Problem Relation Age of Onset  . Dementia Mother   . Arthritis Father   . Heart failure Neg Hx   . Kidney failure Neg Hx   . Cancer Neg Hx    Social History:  reports that he has never smoked. He does not have any smokeless tobacco history on file. He reports that he does not drink alcohol or use illicit drugs.  Allergies: No Known Allergies  Medications Prior to Admission  Medication Sig Dispense Refill  . allopurinol (ZYLOPRIM) 300 MG tablet Take 1 tablet (300 mg total) by mouth daily. For gout    . amLODipine (NORVASC) 10 MG tablet Take 10 mg by mouth daily.    Marland Kitchen atorvastatin (LIPITOR) 10 MG tablet Take 1 tablet (10 mg total) by mouth daily. For high cholesterol    . clonazePAM (KLONOPIN) 1 MG tablet Take 1 tablet (1 mg total) by mouth at bedtime. (Patient taking differently: Take 0.5 mg by mouth 2 (two) times daily. ) 30 tablet 0  . diltiazem (CARDIZEM CD) 120 MG 24 hr capsule Take 1 capsule (120 mg total) by mouth daily. 30 capsule 0  . hydrALAZINE (APRESOLINE) 25 MG tablet Take 1 tablet (25 mg total) by mouth every 6 (six) hours.    .  metoprolol (TOPROL-XL) 200 MG 24 hr tablet Take 1 tablet (200 mg total) by mouth daily. For high blood pressure (Patient taking differently: Take 100 mg by mouth 2 (two) times daily. For high blood pressure)    . mirtazapine (REMERON) 30 MG tablet Take 30 mg by mouth at bedtime.    . traZODone (DESYREL) 50 MG tablet Take 0.5 tablets (25 mg total) by mouth 3 (three) times daily. (Patient taking differently: Take 200 mg by mouth at bedtime. )    . aspirin EC 81 MG tablet Take 81 mg by mouth daily.    . cholecalciferol (VITAMIN D) 400 UNITS TABS tablet Take 800 Units by mouth daily.    . colchicine 0.6 MG tablet Take 1 tablet (0.6 mg total) by mouth 2 (two) times daily. For Gout pain (Patient taking differently: Take 0.6 mg by mouth daily. For Gout pain)    . feeding supplement, GLUCERNA SHAKE, (GLUCERNA SHAKE) LIQD Take 237 mLs by mouth 3 (three) times daily with meals.  0  . furosemide (LASIX) 20 MG tablet Take 20 mg by mouth daily.    . megestrol (MEGACE) 400 MG/10ML suspension Take 10 mLs (400 mg total) by mouth daily. 240 mL 0  . OLANZapine (ZYPREXA) injection Inject 10 mg into the muscle 2 (two) times daily before a meal.  1 each     Results for orders placed or performed during the hospital encounter of 07/29/15 (from the past 48 hour(s))  Glucose, capillary     Status: Abnormal   Collection Time: 08/20/15  1:25 PM  Result Value Ref Range   Glucose-Capillary 145 (H) 65 - 99 mg/dL  Glucose, capillary     Status: Abnormal   Collection Time: 08/20/15  4:41 PM  Result Value Ref Range   Glucose-Capillary 282 (H) 65 - 99 mg/dL  Glucose, capillary     Status: Abnormal   Collection Time: 08/20/15  9:03 PM  Result Value Ref Range   Glucose-Capillary 203 (H) 65 - 99 mg/dL  Glucose, capillary     Status: Abnormal   Collection Time: 08/21/15  7:11 AM  Result Value Ref Range   Glucose-Capillary 175 (H) 65 - 99 mg/dL  Glucose, capillary     Status: Abnormal   Collection Time: 08/21/15 11:16 AM   Result Value Ref Range   Glucose-Capillary 237 (H) 65 - 99 mg/dL   Comment 1 Notify RN   Glucose, capillary     Status: Abnormal   Collection Time: 08/21/15  4:25 PM  Result Value Ref Range   Glucose-Capillary 209 (H) 65 - 99 mg/dL  Glucose, capillary     Status: Abnormal   Collection Time: 08/21/15  9:49 PM  Result Value Ref Range   Glucose-Capillary 264 (H) 65 - 99 mg/dL  Glucose, capillary     Status: Abnormal   Collection Time: 08/22/15  7:50 AM  Result Value Ref Range   Glucose-Capillary 141 (H) 65 - 99 mg/dL   No results found.  Review of Systems  Constitutional: Positive for malaise/fatigue.  HENT: Negative.   Eyes: Negative.   Respiratory: Negative.   Cardiovascular: Negative.   Gastrointestinal: Negative.   Musculoskeletal: Negative.   Skin: Negative.   Neurological: Negative.   Psychiatric/Behavioral: Positive for depression and memory loss. Negative for suicidal ideas, hallucinations and substance abuse. The patient is not nervous/anxious and does not have insomnia.     Blood pressure 133/83, pulse 73, temperature 98.4 F (36.9 C), temperature source Oral, resp. rate 18, height 6' (1.829 m), weight 93.895 kg (207 lb), SpO2 98 %. Physical Exam  Nursing note and vitals reviewed. Constitutional: He appears well-developed and well-nourished.  HENT:  Head: Normocephalic and atraumatic.  Eyes: Conjunctivae are normal. Pupils are equal, round, and reactive to light.  Neck: Normal range of motion.  Cardiovascular: Normal rate, regular rhythm and normal heart sounds.   Respiratory: Effort normal and breath sounds normal. No respiratory distress. He has no wheezes. He has no rales.  GI: Soft.  Musculoskeletal: Normal range of motion.  Neurological: He is alert.  Skin: Skin is warm and dry.  Psychiatric: Thought content normal. His affect is blunt. His speech is delayed. He is slowed. Cognition and memory are impaired.     Assessment/Plan Continue treatment 3 times  a week  Christian [name] 08/22/2015, 11:55 AM

## 2015-08-22 NOTE — Progress Notes (Signed)
Patient ID: Christian [name], male   DOB: Nov 29, 1951, 64 y.o.   MRN: [id number]  [facility] Physicians PROGRESS NOTE  PCP: Leanor Rubenstein, MD  HPI/Subjective Appetite is better, feels overall better, has better verbal communication and eye contact., Overall feels good. Psychiatrist recommends to continue ECT 3 times a week and continue management on medicine floor. Zyprexa dose has been increased. Sleeping today in the morning since was awake at nighttime. For  ECT today in the morning  Objective: Filed Vitals:   08/22/15 1313  BP: 138/82  Pulse:   Temp: 98.2 F (36.8 C)  Resp: 16    Filed Weights   08/18/15 0929 08/20/15 1009 08/22/15 0946  Weight: 93.895 kg (207 lb) 93.895 kg (207 lb) 93.895 kg (207 lb)    ROS: Review of Systems  Unable to perform ROS: other  Respiratory: Negative for cough and shortness of breath.   Cardiovascular: Negative for chest pain.  Musculoskeletal: Negative for joint pain.  Psychiatric/Behavioral: Positive for depression.   Exam: Physical Exam  HENT:  Nose: No mucosal edema.  Mouth/Throat: No oropharyngeal exudate or posterior oropharyngeal edema.  Eyes: Conjunctivae are normal. Pupils are equal, round, and reactive to light.  Eyelids seem swollen today.  Neck: No JVD present. Carotid bruit is not present. No edema present. No thyroid mass and no thyromegaly present.  Cardiovascular: S1 normal and S2 normal.  Exam reveals no gallop.   No murmur heard. Pulses:      Dorsalis pedis pulses are 2+ on the right side, and 2+ on the left side.  Respiratory: No respiratory distress. He has no wheezes. He has no rhonchi. He has no rales.  GI: Soft. Bowel sounds are normal. There is no tenderness.  Musculoskeletal:       Right ankle: He exhibits swelling.       Left ankle: He exhibits swelling.  Lymphadenopathy:    He has no cervical adenopathy.  Neurological: He is alert.  Skin: Skin is warm. No rash noted. Nails show no clubbing.  Psychiatric:  He exhibits a depressed mood.     CBG:  Recent Labs Lab 08/21/15 1116 08/21/15 1625 08/21/15 2149 08/22/15 0750 08/22/15 1321  GLUCAP 237* 209* 264* 141* 156*    Scheduled Meds: . sodium chloride  250 mL Intravenous Once  . allopurinol  300 mg Oral Daily  . atorvastatin  10 mg Oral Daily  . cholecalciferol  800 Units Oral Daily  . clonazePAM  1 mg Oral QHS  . colchicine  0.6 mg Oral Daily  . diltiazem  120 mg Oral Daily  . docusate sodium  200 mg Oral BID  . enoxaparin (LOVENOX) injection  40 mg Subcutaneous Q24H  . feeding supplement (GLUCERNA SHAKE)  237 mL Oral TID WC  . finasteride  5 mg Oral Daily  . hydrALAZINE  50 mg Oral 4 times per day  . insulin aspart  0-9 Units Subcutaneous TID WC  . insulin glargine  5 Units Subcutaneous Daily  . lidocaine (cardiac) 100 mg/10ml  4 mg Intravenous Once  . lisinopril  10 mg Oral Daily  . megestrol  400 mg Oral Daily  . metoCLOPramide (REGLAN) injection  10 mg Intravenous Once  . metoprolol succinate  200 mg Oral Daily  . mirtazapine  45 mg Oral QHS  . OLANZapine zydis  30 mg Oral QHS  . polyethylene glycol  17 g Oral Daily  . senna  1 tablet Oral Daily  . tamsulosin  0.4 mg Oral QHS  .  traZODone  25 mg Oral TID   Continuous Infusions: . sodium chloride 50 mL/hr at 08/22/15 0641    Assessment/Plan:  1. Failure to thrive, dehydration due to severe depression. ECT treatments Monday Wednesday Friday as per Dr. Toni Amend. Patient is to be continued on IV fluids to maintain hydration status, if needed , although patient's oral intake has been improving. Zyprexa dose has been advanced per psychiatry. Improvement with mood, appetite noted. Following closely and initiate IV fluids as needed.  2. Essential hypertension- currently stable on usual medications 3. Type 2 diabetes- initially was difficult to manage due to intermittent PO intake. Patient was on sliding scale, now initiate on low-dose insulin Lantus, advance  today 4. Hyperlipidemia unspecified on atorvastatin 5. History of gout on allopurinol 6. Overflow incontinence- Dr. Fonnie Birkenhead has asked the nursing staff to stand . Patient up every 4 hours with the urinal to have him urinate. With the ECT treatment ends up losing a lot of his urine so a condom catheter is needed. Now patient is to continue Flomax as well as finasteride for BPH 7. BPH- Flomax and finasteride, no complains 8. Constipation-  MiraLAX. Senna. Last bowel movement 08/22/2015.  Code Status:     Code Status Orders        Start     Ordered   07/29/15 2027  Full code   Continuous     07/29/15 2028     Disposition Plan: To be determined  Consultants:  Psychiatry  Procedures:  ECT treatments  Time spent: 30 minutes  [facility]  [facility] Hospitalists

## 2015-08-22 NOTE — Anesthesia Procedure Notes (Signed)
Date/Time: 08/22/2015 12:01 PM Performed by: Lily Kocher Pre-anesthesia Checklist: Patient identified, Emergency Drugs available, Suction available and Patient being monitored Patient Re-evaluated:Patient Re-evaluated prior to inductionOxygen Delivery Method: Circle system utilized Preoxygenation: Pre-oxygenation with 100% oxygen Intubation Type: IV induction Ventilation: Mask ventilation without difficulty and Mask ventilation throughout procedure Airway Equipment and Method: Bite block Placement Confirmation: positive ETCO2 Dental Injury: Teeth and Oropharynx as per pre-operative assessment

## 2015-08-22 NOTE — Anesthesia Postprocedure Evaluation (Signed)
  Anesthesia Post-op Note  Patient: Christian [name]  Procedure(s) Performed: * No procedures listed *  Anesthesia type:General  Patient location: PACU  Post pain: Pain level controlled  Post assessment: Post-op Vital signs reviewed, Patient's Cardiovascular Status Stable, Respiratory Function Stable, Patent Airway and No signs of Nausea or vomiting  Post vital signs: Reviewed and stable  Last Vitals:  Filed Vitals:   08/22/15 1254  BP:   Pulse: 87  Temp: 37.7 C  Resp: 17    Level of consciousness: awake, alert  and patient cooperative  Complications: No apparent anesthesia complications

## 2015-08-22 NOTE — Transfer of Care (Signed)
Immediate Anesthesia Transfer of Care Note  Patient: Christian [name]  Procedure(s) Performed: ECT  Patient Location: PACU  Anesthesia Type:General  Level of Consciousness: sedated  Airway & Oxygen Therapy: Patient Spontanous Breathing and Patient connected to face mask oxygen  Post-op Assessment: Report given to RN  Post vital signs: Reviewed and stable  Last Vitals:  Filed Vitals:   08/22/15 1216  BP: 136/105  Pulse: 95  Temp: 37.9 C  Resp: 19    Complications: No apparent anesthesia complications

## 2015-08-22 NOTE — Progress Notes (Signed)
Plan is for patient to transfer to BMU once medically stable. Patient ambulated out to the nurses station today. Clinical Social Worker (CSW) will continue to follow and assist as needed.   Jetta Lout, LCSWA [phone]

## 2015-08-23 LAB — GLUCOSE, CAPILLARY
Glucose-Capillary: 145 mg/dL — ABNORMAL HIGH (ref 65–99)
Glucose-Capillary: 141 mg/dL — ABNORMAL HIGH (ref 65–99)
Glucose-Capillary: 267 mg/dL — ABNORMAL HIGH (ref 65–99)
Glucose-Capillary: 184 mg/dL — ABNORMAL HIGH (ref 65–99)
Glucose-Capillary: 161 mg/dL — ABNORMAL HIGH (ref 65–99)

## 2015-08-23 MED ORDER — INSULIN GLARGINE 100 UNIT/ML ~~LOC~~ SOLN
7.0000 [IU] | Freq: Every day | SUBCUTANEOUS | Status: DC
  Administered 2015-08-24: 7 [IU] via SUBCUTANEOUS
  Filled 2015-08-23 (×2): qty 0.07

## 2015-08-23 NOTE — Plan of Care (Addendum)
Problem: Discharge Progression Outcomes Goal: Other Discharge Outcomes/Goals Outcome: Progressing VSS. A&Ox4. Denies pain. Voids without difficulty. Pt uses the urinal. Pt tolerating his diet. ECT on Monday. Sitter at the bedside.

## 2015-08-23 NOTE — Plan of Care (Signed)
Problem: Discharge Progression Outcomes Goal: Other Discharge Outcomes/Goals Outcome: Progressing Plan of care progress to goal:  1. Discharge plan: Pt currently receiving ECT treatments.  2. Hemodynamics: vital signs stable.  3. Complications resolved: Pt tolerating his ECT treatments.  4. Diet: Pt tolerating his diet  5. Activity: up with assist. Pt tolerating physical therapy.

## 2015-08-23 NOTE — Progress Notes (Signed)
Patient ID: Christian [name], male   DOB: 19-Apr-1951, 64 y.o.   MRN: [id number]  [facility] Physicians PROGRESS NOTE  PCP: Leanor Rubenstein, MD  HPI/Subjective Patient more interactive and tolerating by mouth better  Objective: Filed Vitals:   08/23/15 1221  BP: 102/74  Pulse: 97  Temp:   Resp:     Filed Weights   08/18/15 0929 08/20/15 1009 08/22/15 0946  Weight: 93.895 kg (207 lb) 93.895 kg (207 lb) 93.895 kg (207 lb)    ROS: Review of Systems  Unable to perform ROS: other  Respiratory: Negative for cough and shortness of breath.   Cardiovascular: Negative for chest pain.  Musculoskeletal: Negative for joint pain.  Psychiatric/Behavioral: Positive for depression.   Exam: Physical Exam  HENT:  Nose: No mucosal edema.  Mouth/Throat: No oropharyngeal exudate or posterior oropharyngeal edema.  Eyes: Conjunctivae are normal. Pupils are equal, round, and reactive to light.  Eyelids seem swollen today.  Neck: No JVD present. Carotid bruit is not present. No edema present. No thyroid mass and no thyromegaly present.  Cardiovascular: S1 normal and S2 normal.  Exam reveals no gallop.   No murmur heard. Pulses:      Dorsalis pedis pulses are 2+ on the right side, and 2+ on the left side.  Respiratory: No respiratory distress. He has no wheezes. He has no rhonchi. He has no rales.  GI: Soft. Bowel sounds are normal. There is no tenderness.  Musculoskeletal:       Right ankle: He exhibits swelling.       Left ankle: He exhibits swelling.  Lymphadenopathy:    He has no cervical adenopathy.  Neurological: He is alert.  Skin: Skin is warm. No rash noted. Nails show no clubbing.  Psychiatric: He exhibits a depressed mood.     CBG:  Recent Labs Lab 08/22/15 1626 08/22/15 2126 08/23/15 0736 08/23/15 0921 08/23/15 1213  GLUCAP 309* 211* 145* 141* 267*    Scheduled Meds: . sodium chloride  250 mL Intravenous Once  . allopurinol  300 mg Oral Daily  . atorvastatin  10  mg Oral Daily  . cholecalciferol  800 Units Oral Daily  . clonazePAM  1 mg Oral QHS  . colchicine  0.6 mg Oral Daily  . diltiazem  120 mg Oral Daily  . docusate sodium  200 mg Oral BID  . enoxaparin (LOVENOX) injection  40 mg Subcutaneous Q24H  . feeding supplement (GLUCERNA SHAKE)  237 mL Oral TID WC  . finasteride  5 mg Oral Daily  . hydrALAZINE  50 mg Oral 4 times per day  . insulin aspart  0-9 Units Subcutaneous TID WC  . insulin glargine  5 Units Subcutaneous Daily  . lidocaine (cardiac) 100 mg/39ml  4 mg Intravenous Once  . lisinopril  10 mg Oral Daily  . megestrol  400 mg Oral Daily  . metoCLOPramide (REGLAN) injection  10 mg Intravenous Once  . metoprolol succinate  200 mg Oral Daily  . mirtazapine  45 mg Oral QHS  . OLANZapine zydis  30 mg Oral QHS  . polyethylene glycol  17 g Oral Daily  . senna  1 tablet Oral Daily  . tamsulosin  0.4 mg Oral QHS  . traZODone  25 mg Oral TID   Continuous Infusions: . sodium chloride 50 mL/hr at 08/23/15 0735    Assessment/Plan:  1. Failure to thrive, dehydration due to severe depression. ECT treatments Monday Wednesday Friday as per Dr. Toni Amend. Discontinue IV fluids.  2.  Essential hypertension- currently stable on usual medications  3. Type 2 diabetes- initially was difficult to manage due to intermittent PO intake on low-dose Lantus blood glucose still slightly elevated I'll increase his Lantus dose 4. Hyperlipidemia unspecified on atorvastatin 5. History of gout on allopurinol 6. Overflow incontinence- continue Flomax 7. BPH- Flomax and finasteride, no complains 8. Constipation-  MiraLAX. Senna. Last bowel movement 08/22/2015.  Code Status:     Code Status Orders        Start     Ordered   07/29/15 2027  Full code   Continuous     07/29/15 2028     Disposition Plan: To be determined  Consultants:  Psychiatry  Procedures:  ECT treatments  Time spent: 25 minutes  Taysen Bushart, [facility]  [facility]  Hospitalists

## 2015-08-24 LAB — GLUCOSE, CAPILLARY
Glucose-Capillary: 150 mg/dL — ABNORMAL HIGH (ref 65–99)
Glucose-Capillary: 245 mg/dL — ABNORMAL HIGH (ref 65–99)
Glucose-Capillary: 251 mg/dL — ABNORMAL HIGH (ref 65–99)
Glucose-Capillary: 185 mg/dL — ABNORMAL HIGH (ref 65–99)
Glucose-Capillary: 171 mg/dL — ABNORMAL HIGH (ref 65–99)

## 2015-08-24 MED ORDER — INSULIN GLARGINE 100 UNIT/ML ~~LOC~~ SOLN
12.0000 [IU] | Freq: Every day | SUBCUTANEOUS | Status: DC
  Administered 2015-08-24 – 2015-08-26 (×3): 12 [IU] via SUBCUTANEOUS
  Filled 2015-08-24 (×4): qty 0.12

## 2015-08-24 NOTE — Progress Notes (Signed)
Patient ID: Christian [name], male   DOB: 12-05-1950, 64 y.o.   MRN: [id number]  [facility] Physicians PROGRESS NOTE  PCP: Leanor Rubenstein, MD   HPI/Subjective Denies any significant complaints except depression Objective: Filed Vitals:   08/24/15 1135  BP: 130/75  Pulse: 88  Temp:   Resp:     Filed Weights   08/18/15 0929 08/20/15 1009 08/22/15 0946  Weight: 93.895 kg (207 lb) 93.895 kg (207 lb) 93.895 kg (207 lb)    ROS: Review of Systems  Unable to perform ROS: other  Respiratory: Negative for cough and shortness of breath.   Cardiovascular: Negative for chest pain.  Musculoskeletal: Negative for joint pain.  Psychiatric/Behavioral: Positive for depression.   Exam: Physical Exam  HENT:  Nose: No mucosal edema.  Mouth/Throat: No oropharyngeal exudate or posterior oropharyngeal edema.  Eyes: Conjunctivae are normal. Pupils are equal, round, and reactive to light.  Eyelids seem swollen today.  Neck: No JVD present. Carotid bruit is not present. No edema present. No thyroid mass and no thyromegaly present.  Cardiovascular: S1 normal and S2 normal.  Exam reveals no gallop.   No murmur heard. Pulses:      Dorsalis pedis pulses are 2+ on the right side, and 2+ on the left side.  Respiratory: No respiratory distress. He has no wheezes. He has no rhonchi. He has no rales.  GI: Soft. Bowel sounds are normal. There is no tenderness.  Musculoskeletal:       Right ankle: He exhibits swelling.       Left ankle: He exhibits swelling.  Lymphadenopathy:    He has no cervical adenopathy.  Neurological: He is alert.  Skin: Skin is warm. No rash noted. Nails show no clubbing.  Psychiatric: He exhibits a depressed mood.     CBG:  Recent Labs Lab 08/23/15 1643 08/23/15 2058 08/24/15 0721 08/24/15 0948 08/24/15 1118  GLUCAP 184* 161* 150* 245* 251*    Scheduled Meds: . sodium chloride  250 mL Intravenous Once  . allopurinol  300 mg Oral Daily  . atorvastatin  10 mg  Oral Daily  . cholecalciferol  800 Units Oral Daily  . clonazePAM  1 mg Oral QHS  . colchicine  0.6 mg Oral Daily  . diltiazem  120 mg Oral Daily  . docusate sodium  200 mg Oral BID  . enoxaparin (LOVENOX) injection  40 mg Subcutaneous Q24H  . feeding supplement (GLUCERNA SHAKE)  237 mL Oral TID WC  . finasteride  5 mg Oral Daily  . hydrALAZINE  50 mg Oral 4 times per day  . insulin aspart  0-9 Units Subcutaneous TID WC  . insulin glargine  7 Units Subcutaneous Daily  . lidocaine (cardiac) 100 mg/22ml  4 mg Intravenous Once  . lisinopril  10 mg Oral Daily  . megestrol  400 mg Oral Daily  . metoCLOPramide (REGLAN) injection  10 mg Intravenous Once  . metoprolol succinate  200 mg Oral Daily  . mirtazapine  45 mg Oral QHS  . OLANZapine zydis  30 mg Oral QHS  . polyethylene glycol  17 g Oral Daily  . senna  1 tablet Oral Daily  . tamsulosin  0.4 mg Oral QHS  . traZODone  25 mg Oral TID   Continuous Infusions: . sodium chloride 50 mL/hr at 08/24/15 0229    Assessment/Plan:  1. Failure to thrive, dehydration due to severe depression. ECT treatments Monday Wednesday Friday as per Dr. Toni Amend. Discontinue IV fluids.  2. Essential hypertension-  currently stable on usual medications  3. Type 2 diabetes-increased Lantus  4. Hyperlipidemia unspecified on atorvastatin 5. History of gout on allopurinol 6. Overflow incontinence- continue Flomax 7. BPH- Flomax and finasteride, no complains 8. Constipation-  MiraLAX. Senna. Last bowel movement 08/22/2015.  Code Status:     Code Status Orders        Start     Ordered   07/29/15 2027  Full code   Continuous     07/29/15 2028     Disposition Plan: To be determined  Consultants:  Psychiatry  Procedures:  ECT treatments  Time spent: 25 minutes  Nishan Ovens, [facility]  [facility] Hospitalists

## 2015-08-24 NOTE — Plan of Care (Signed)
Problem: Discharge Progression Outcomes Goal: Other Discharge Outcomes/Goals Outcome: Progressing Alert and oriented X3. Disoriented to time. Cooperative with care. Voids without difficulty. Pt uses the urinal. Pt tolerating his diet. ECT on Monday. Sitter at the bedside.

## 2015-08-24 NOTE — Plan of Care (Signed)
Problem: Discharge Progression Outcomes Goal: Other Discharge Outcomes/Goals Outcome: Progressing VSS. IVF d/c. A&Ox4. Denies pain. Voids without difficulty. Pt uses the urinal. Pt tolerating his diet. ECT on Monday. Sitter at the bedside.

## 2015-08-24 NOTE — Progress Notes (Signed)
PT Cancellation Note  Patient Details Name: Christian [name] MRN: [id number] DOB: 06-30-1951   Cancelled Treatment:    Reason Eval/Treat Not Completed: Patient declined, no reason specified;Other (comment) (Patient refused PT services, saying "Not today.")   Neita Carp, PT, DPT 08/24/2015, 9:37 AM

## 2015-08-25 ENCOUNTER — Inpatient Hospital Stay: Payer: Commercial Managed Care - HMO

## 2015-08-25 ENCOUNTER — Other Ambulatory Visit: Payer: Self-pay

## 2015-08-25 ENCOUNTER — Encounter: Payer: Self-pay | Admitting: *Deleted

## 2015-08-25 ENCOUNTER — Inpatient Hospital Stay: Payer: Commercial Managed Care - HMO | Admitting: Anesthesiology

## 2015-08-25 DIAGNOSIS — E669 Obesity, unspecified: Secondary | ICD-10-CM | POA: Diagnosis not present

## 2015-08-25 DIAGNOSIS — F315 Bipolar disorder, current episode depressed, severe, with psychotic features: Secondary | ICD-10-CM | POA: Diagnosis not present

## 2015-08-25 DIAGNOSIS — Z6828 Body mass index (BMI) 28.0-28.9, adult: Secondary | ICD-10-CM | POA: Diagnosis not present

## 2015-08-25 LAB — GLUCOSE, CAPILLARY
Glucose-Capillary: 116 mg/dL — ABNORMAL HIGH (ref 65–99)
Glucose-Capillary: 96 mg/dL (ref 65–99)
Glucose-Capillary: 339 mg/dL — ABNORMAL HIGH (ref 65–99)
Glucose-Capillary: 253 mg/dL — ABNORMAL HIGH (ref 65–99)

## 2015-08-25 MED ORDER — SUCCINYLCHOLINE CHLORIDE 20 MG/ML IJ SOLN
120.0000 mg | Freq: Once | INTRAMUSCULAR | Status: AC
Start: 2015-08-25 — End: 2015-08-25
  Administered 2015-08-25: 120 mg via INTRAVENOUS
  Filled 2015-08-25: qty 6

## 2015-08-25 MED ORDER — NITROGLYCERIN 0.4 MG SL SUBL: 0.4000 mg | SUBLINGUAL_TABLET | Freq: Once | SUBLINGUAL | Status: DC

## 2015-08-25 MED ORDER — LABETALOL HCL 5 MG/ML IV SOLN
20.0000 mg | Freq: Once | INTRAVENOUS | Status: AC
  Administered 2015-08-25: 20 mg via INTRAVENOUS

## 2015-08-25 MED ORDER — SODIUM CHLORIDE 0.9 % IV SOLN
250.0000 mL | Freq: Once | INTRAVENOUS | Status: AC
  Administered 2015-08-25: 10:00:00 250 mL via INTRAVENOUS

## 2015-08-25 MED ORDER — LIDOCAINE HCL (CARDIAC) 20 MG/ML IV SOLN
4.0000 mg | Freq: Once | INTRAVENOUS | Status: AC
Start: 2015-08-25 — End: 2015-08-25
  Administered 2015-08-25: 12:00:00 4 mg via INTRAVENOUS

## 2015-08-25 MED ORDER — DEXTROSE 5 % IV SOLN
INTRAVENOUS | Status: DC | PRN
  Administered 2015-08-25: 12:00:00 via INTRAVENOUS

## 2015-08-25 MED ORDER — KETAMINE HCL 10 MG/ML IJ SOLN
120.0000 mg | Freq: Once | INTRAMUSCULAR | Status: AC
  Administered 2015-08-25: 120 mg via INTRAVENOUS

## 2015-08-25 MED ORDER — LABETALOL HCL 5 MG/ML IV SOLN
20.0000 mg | Freq: Once | INTRAVENOUS | Status: AC
  Administered 2015-08-25: 20 mg via INTRAVENOUS
  Filled 2015-08-25: qty 4

## 2015-08-25 NOTE — Progress Notes (Signed)
PT Cancellation Note  Patient Details Name: Christian [name] MRN: [id number] DOB: 1951/09/02   Cancelled Treatment:    Reason Eval/Treat Not Completed: Patient at procedure or test/unavailable (Treatment session attempted.  Patient currently leaving unit for ECT treatment.  Will re-attempt in PM, time permitting.)   Joana Nolton H. Manson Passey, PT, DPT, NCS 08/25/2015, 10:14 AM [phone]

## 2015-08-25 NOTE — Plan of Care (Signed)
Problem: Discharge Progression Outcomes Goal: Other Discharge Outcomes/Goals Outcome: Progressing NPO for ECT treatment today. Responds well to simple questions. Went to ECT around 10:30am. Returned around 2:00pm. Ate very well today and took medications well. Sat in chair for about 2 hours and tolerated well. Walked in hall with minimal assistance and tolerated well. Responding well to commands.

## 2015-08-25 NOTE — Progress Notes (Signed)
Patient ID: Christian [name], male   DOB: 1951/07/29, 64 y.o.   MRN: [id number]  [facility] Physicians PROGRESS NOTE  PCP: Leanor Rubenstein, MD   Denies any new changes Objective: Filed Vitals:   08/25/15 1039  BP: 132/79  Pulse: 84  Temp: 98.2 F (36.8 C)  Resp: 18    Filed Weights   08/20/15 1009 08/22/15 0946 08/25/15 1039  Weight: 93.895 kg (207 lb) 93.895 kg (207 lb) 93.895 kg (207 lb)    ROS: Review of Systems  Unable to perform ROS: other  Respiratory: Negative for cough and shortness of breath.   Cardiovascular: Negative for chest pain.  Musculoskeletal: Negative for joint pain.  Psychiatric/Behavioral: Positive for depression.   Exam: Physical Exam  HENT:  Nose: No mucosal edema.  Mouth/Throat: No oropharyngeal exudate or posterior oropharyngeal edema.  Eyes: Conjunctivae are normal. Pupils are equal, round, and reactive to light.  Eyelids seem swollen today.  Neck: No JVD present. Carotid bruit is not present. No edema present. No thyroid mass and no thyromegaly present.  Cardiovascular: S1 normal and S2 normal.  Exam reveals no gallop.   No murmur heard. Pulses:      Dorsalis pedis pulses are 2+ on the right side, and 2+ on the left side.  Respiratory: No respiratory distress. He has no wheezes. He has no rhonchi. He has no rales.  GI: Soft. Bowel sounds are normal. There is no tenderness.  Musculoskeletal:       Right ankle: He exhibits swelling.       Left ankle: He exhibits swelling.  Lymphadenopathy:    He has no cervical adenopathy.  Neurological: He is alert.  Skin: Skin is warm. No rash noted. Nails show no clubbing.  Psychiatric: He exhibits a depressed mood.     CBG:  Recent Labs Lab 08/24/15 0948 08/24/15 1118 08/24/15 1608 08/24/15 2133 08/25/15 0712  GLUCAP 245* 251* 185* 171* 116*    Scheduled Meds: . sodium chloride  250 mL Intravenous Once  . allopurinol  300 mg Oral Daily  . atorvastatin  10 mg Oral Daily  .  cholecalciferol  800 Units Oral Daily  . clonazePAM  1 mg Oral QHS  . colchicine  0.6 mg Oral Daily  . diltiazem  120 mg Oral Daily  . docusate sodium  200 mg Oral BID  . enoxaparin (LOVENOX) injection  40 mg Subcutaneous Q24H  . feeding supplement (GLUCERNA SHAKE)  237 mL Oral TID WC  . finasteride  5 mg Oral Daily  . hydrALAZINE  50 mg Oral 4 times per day  . insulin aspart  0-9 Units Subcutaneous TID WC  . insulin glargine  12 Units Subcutaneous QHS  . ketamine  120 mg Intravenous Once  . labetalol  20 mg Intravenous Once  . lidocaine (cardiac) 100 mg/31ml  4 mg Intravenous Once  . lidocaine (cardiac) 100 mg/66ml  4 mg Intravenous Once  . lisinopril  10 mg Oral Daily  . megestrol  400 mg Oral Daily  . metoCLOPramide (REGLAN) injection  10 mg Intravenous Once  . metoprolol succinate  200 mg Oral Daily  . mirtazapine  45 mg Oral QHS  . OLANZapine zydis  30 mg Oral QHS  . polyethylene glycol  17 g Oral Daily  . senna  1 tablet Oral Daily  . succinylcholine  120 mg Intravenous Once  . tamsulosin  0.4 mg Oral QHS  . traZODone  25 mg Oral TID   Continuous Infusions:  Assessment/Plan:  1. Failure to thrive, dehydration due to severe depression. ECT treatments Monday Wednesday Friday as per Dr. Toni Amend. .  2. Essential hypertension- currently stable on usual medications  3. Type 2 diabetes-increased Lantus  4. Hyperlipidemia unspecified on atorvastatin 5. History of gout on allopurinol 6. Overflow incontinence- continue Flomax 7. BPH- Flomax and finasteride, no complains 8. Constipation on MiraLAX and Colace  Code Status:     Code Status Orders        Start     Ordered   07/29/15 2027  Full code   Continuous     07/29/15 2028     Disposition Plan: To be determined  Consultants:  Psychiatry  Procedures:  ECT treatments  Time spent: 25 minutes  Geneva Barrero, [facility]  [facility] [place] Hospitalists

## 2015-08-25 NOTE — H&P (Signed)
Christian [name] is an 64 y.o. male.   Chief Complaint: No new complaint. Continue very depressed tired withdrawn HPI: Continuing index course ECT  Past Medical History  Diagnosis Date  . Mood swings   . Type II or unspecified type diabetes mellitus with unspecified complication, uncontrolled 06/02/2014  . Other and unspecified hyperlipidemia 06/02/2014  . OSA on CPAP 06/02/2014  . 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 Bipolar disorder   . CHF (congestive heart failure)   . CKD (chronic kidney disease), stage III   . Diabetes mellitus without complication   . Hypertension   . Gout   . Bipolar 1 disorder   . OSA on CPAP     History reviewed. No pertinent past surgical history.  Family History  Problem Relation Age of Onset  . Dementia Mother   . Arthritis Father   . Heart failure Neg Hx   . Kidney failure Neg Hx   . Cancer Neg Hx    Social History:  reports that he has never smoked. He does not have any smokeless tobacco history on file. He reports that he does not drink alcohol or use illicit drugs.  Allergies: No Known Allergies  Medications Prior to Admission  Medication Sig Dispense Refill  . allopurinol (ZYLOPRIM) 300 MG tablet Take 1 tablet (300 mg total) by mouth daily. For gout    . amLODipine (NORVASC) 10 MG tablet Take 10 mg by mouth daily.    Marland Kitchen atorvastatin (LIPITOR) 10 MG tablet Take 1 tablet (10 mg total) by mouth daily. For high cholesterol    . clonazePAM (KLONOPIN) 1 MG tablet Take 1 tablet (1 mg total) by mouth at bedtime. (Patient taking differently: Take 0.5 mg by mouth 2 (two) times daily. ) 30 tablet 0  . diltiazem (CARDIZEM CD) 120 MG 24 hr capsule Take 1 capsule (120 mg total) by mouth daily. 30 capsule 0  . hydrALAZINE (APRESOLINE) 25 MG tablet Take 1 tablet (25 mg total) by mouth every 6 (six) hours.    . metoprolol (TOPROL-XL) 200 MG 24 hr tablet Take 1 tablet (200 mg total) by mouth daily. For high blood pressure  (Patient taking differently: Take 100 mg by mouth 2 (two) times daily. For high blood pressure)    . mirtazapine (REMERON) 30 MG tablet Take 30 mg by mouth at bedtime.    . traZODone (DESYREL) 50 MG tablet Take 0.5 tablets (25 mg total) by mouth 3 (three) times daily. (Patient taking differently: Take 200 mg by mouth at bedtime. )    . aspirin EC 81 MG tablet Take 81 mg by mouth daily.    . cholecalciferol (VITAMIN D) 400 UNITS TABS tablet Take 800 Units by mouth daily.    . colchicine 0.6 MG tablet Take 1 tablet (0.6 mg total) by mouth 2 (two) times daily. For Gout pain (Patient taking differently: Take 0.6 mg by mouth daily. For Gout pain)    . feeding supplement, GLUCERNA SHAKE, (GLUCERNA SHAKE) LIQD Take 237 mLs by mouth 3 (three) times daily with meals.  0  . furosemide (LASIX) 20 MG tablet Take 20 mg by mouth daily.    . megestrol (MEGACE) 400 MG/10ML suspension Take 10 mLs (400 mg total) by mouth daily. 240 mL 0  . OLANZapine (ZYPREXA) injection Inject 10 mg into the muscle 2 (two) times daily before a meal. 1 each     Results for orders placed or performed during the hospital encounter of  07/29/15 (from the past 48 hour(s))  Glucose, capillary     Status: Abnormal   Collection Time: 08/23/15 12:13 PM  Result Value Ref Range   Glucose-Capillary 267 (H) 65 - 99 mg/dL  Glucose, capillary     Status: Abnormal   Collection Time: 08/23/15  4:43 PM  Result Value Ref Range   Glucose-Capillary 184 (H) 65 - 99 mg/dL  Glucose, capillary     Status: Abnormal   Collection Time: 08/23/15  8:58 PM  Result Value Ref Range   Glucose-Capillary 161 (H) 65 - 99 mg/dL   Comment 1 Notify RN   Glucose, capillary     Status: Abnormal   Collection Time: 08/24/15  7:21 AM  Result Value Ref Range   Glucose-Capillary 150 (H) 65 - 99 mg/dL   Comment 1 Notify RN   Glucose, capillary     Status: Abnormal   Collection Time: 08/24/15  9:48 AM  Result Value Ref Range   Glucose-Capillary 245 (H) 65 - 99 mg/dL    Comment 1 Notify RN   Glucose, capillary     Status: Abnormal   Collection Time: 08/24/15 11:18 AM  Result Value Ref Range   Glucose-Capillary 251 (H) 65 - 99 mg/dL   Comment 1 Notify RN   Glucose, capillary     Status: Abnormal   Collection Time: 08/24/15  4:08 PM  Result Value Ref Range   Glucose-Capillary 185 (H) 65 - 99 mg/dL   Comment 1 Notify RN   Glucose, capillary     Status: Abnormal   Collection Time: 08/24/15  9:33 PM  Result Value Ref Range   Glucose-Capillary 171 (H) 65 - 99 mg/dL  Glucose, capillary     Status: Abnormal   Collection Time: 08/25/15  7:12 AM  Result Value Ref Range   Glucose-Capillary 116 (H) 65 - 99 mg/dL   No results found.  Review of Systems  Unable to perform ROS   Blood pressure 132/79, pulse 84, temperature 98.2 F (36.8 C), temperature source Oral, resp. rate 18, height 6' (1.829 m), weight 93.895 kg (207 lb), SpO2 100 %. Physical Exam  Nursing note and vitals reviewed. Constitutional: He appears well-developed and well-nourished.  HENT:  Head: Normocephalic and atraumatic.  Eyes: Conjunctivae are normal. Pupils are equal, round, and reactive to light.  Neck: Normal range of motion.  Cardiovascular: Normal rate, regular rhythm and normal heart sounds.   Respiratory: Effort normal and breath sounds normal. No respiratory distress. He has no wheezes.  GI: Soft.  Musculoskeletal: Normal range of motion.  Neurological: He is alert.  Skin: Skin is warm and dry.  Psychiatric: Thought content normal. His speech is delayed. He is slowed. Cognition and memory are impaired. He exhibits a depressed mood.     Assessment/Plan Continuing index course ECT 3 times a week this week with bilateral treatment  Christian [name] 08/25/2015, 12:11 PM

## 2015-08-25 NOTE — Plan of Care (Signed)
Problem: Discharge Progression Outcomes Goal: Other Discharge Outcomes/Goals Outcome: Progressing Plan of care progress to goals: 1. Alert x3, disoriented to time. No c/o pain. Resting comfortably in bed throughout the night  2. Hemodynamically:             -VSS, afebrile              -requires encouragement to void but does so w/o difficulty using urinal              -ECT scheduled for today, NPO since midnight  3. Tolerating diet w/o problem, appetite increasing 4. 1:1 sitter remains at bedside due to IVC on admission

## 2015-08-25 NOTE — Transfer of Care (Signed)
Immediate Anesthesia Transfer of Care Note  Patient: Christian [name]  Procedure(s) Performed: ECT   Patient Location: PACU  Anesthesia Type:General  Level of Consciousness: lethargic  Airway & Oxygen Therapy: Patient Spontanous Breathing  Post-op Assessment: Report given to RN  Post vital signs: stable  Last Vitals:  Filed Vitals:   08/25/15 1039  BP: 132/79  Pulse: 84  Temp: 36.8 C  Resp: 18    Complications: No apparent anesthesia complications

## 2015-08-25 NOTE — Consult Note (Signed)
[facility]   Reason for Consult:  Follow-up note for this 64 year old man with bipolar disorder depressed with psychotic features who has been undergoing bilateral ECT treatment Referring Physician:  Allena Katz Patient Identification: Grant Swager Wilcher MRN:  [id number] Principal Diagnosis: Bipolar disorder, current episode depressed, severe, with psychotic features Diagnosis:   Patient Active Problem List   Diagnosis Date Noted  . Pressure ulcer [L89.90] 07/30/2015  . Protein-calorie malnutrition, severe [E43] 07/30/2015  . Dehydration [E86.0] 07/29/2015  . Bipolar disorder, current episode depressed, severe, with psychotic features [F31.5] 07/09/2015  . Diabetes [E11.9] 07/09/2015  . UTI (urinary tract infection) [N39.0] 06/19/2014  . Positive blood culture [R78.81] 06/19/2014  . Bipolar affective disorder, current episode manic with psychotic symptoms [F31.2] 06/17/2014  . Type II or unspecified type diabetes mellitus with unspecified complication, uncontrolled [E11.8] 06/02/2014  . Other and unspecified hyperlipidemia [E78.5] 06/02/2014  . OSA on CPAP [G47.33] 06/02/2014  . 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 Gout [M10.9] 06/02/2014  . Hypertension [I10]     Total Time spent with patient: 35  Subjective:   Demontrez Rindfleisch Dowdy is a 64 y.o. male patient admitted with "I guess I'm doing pretty good".  HPI:  Information from the patient and the chart. Patient was seen this morning for ECT treatment and had bilateral ECT which was once again tolerated well. This was his 12th treatment. On interview today the patient was awake in the evening and told me that he felt like he was doing pretty good. He denied feeling like he was depressed. Denied suicidal thoughts denied hallucinations and denied any delusions especially negative ones. On the other hand his energy level is still clearly very poor. He gets up occasionally  with physical therapy but has also refused some physical therapy over the weekend. Whenever I come to see him in his room he is always lying down flat hardly moving. He tells me that he is not aware of having any thoughts. He would like to go home he says but can't articulate any particular reason or thing he is looking forward to. Unfortunately this seems to be his mental state over the last several days despite continued ECT.  Patient appears to be at a point of plateauing with benefit to ECT although he is still very impaired.  Past Psychiatric History: Long history of bipolar disorder with both manic and depressed episodes as well as a past history of suicide attempts and psychosis  Risk to Self: Is patient at risk for suicide?: Yes Risk to Others:   Prior Inpatient Therapy:   Prior Outpatient Therapy:    Past Medical History:  Past Medical History  Diagnosis Date  . Mood swings   . Type II or unspecified type diabetes mellitus with unspecified complication, uncontrolled 06/02/2014  . Other and unspecified hyperlipidemia 06/02/2014  . OSA on CPAP 06/02/2014  . 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 Bipolar disorder   . CHF (congestive heart failure)   . CKD (chronic kidney disease), stage III   . Diabetes mellitus without complication   . Hypertension   . Gout   . Bipolar 1 disorder   . OSA on CPAP    History reviewed. No pertinent past surgical history. Family History:  Family History  Problem Relation Age of Onset  . Dementia Mother   . Arthritis Father   . Heart failure Neg Hx   . Kidney failure Neg Hx   .  Cancer Neg Hx    Family Psychiatric  History: Not identified Social History:  History  Alcohol Use No     History  Drug Use No    Social History   Social History  . Marital Status: Married    Spouse Name: N/A  . Number of Children: N/A  . Years of Education: N/A   Social History Main Topics  . Smoking status: Never  Smoker   . Smokeless tobacco: None  . Alcohol Use: No  . Drug Use: No  . Sexual Activity: Not Currently   Other Topics Concern  . None   Social History Narrative   ** Merged History Encounter **       Additional Social History:                          Allergies:  No Known Allergies  Labs:  Results for orders placed or performed during the hospital encounter of 07/29/15 (from the past 48 hour(s))  Glucose, capillary     Status: Abnormal   Collection Time: 08/23/15  8:58 PM  Result Value Ref Range   Glucose-Capillary 161 (H) 65 - 99 mg/dL   Comment 1 Notify RN   Glucose, capillary     Status: Abnormal   Collection Time: 08/24/15  7:21 AM  Result Value Ref Range   Glucose-Capillary 150 (H) 65 - 99 mg/dL   Comment 1 Notify RN   Glucose, capillary     Status: Abnormal   Collection Time: 08/24/15  9:48 AM  Result Value Ref Range   Glucose-Capillary 245 (H) 65 - 99 mg/dL   Comment 1 Notify RN   Glucose, capillary     Status: Abnormal   Collection Time: 08/24/15 11:18 AM  Result Value Ref Range   Glucose-Capillary 251 (H) 65 - 99 mg/dL   Comment 1 Notify RN   Glucose, capillary     Status: Abnormal   Collection Time: 08/24/15  4:08 PM  Result Value Ref Range   Glucose-Capillary 185 (H) 65 - 99 mg/dL   Comment 1 Notify RN   Glucose, capillary     Status: Abnormal   Collection Time: 08/24/15  9:33 PM  Result Value Ref Range   Glucose-Capillary 171 (H) 65 - 99 mg/dL  Glucose, capillary     Status: Abnormal   Collection Time: 08/25/15  7:12 AM  Result Value Ref Range   Glucose-Capillary 116 (H) 65 - 99 mg/dL  Glucose, capillary     Status: None   Collection Time: 08/25/15  1:30 PM  Result Value Ref Range   Glucose-Capillary 96 65 - 99 mg/dL  Glucose, capillary     Status: Abnormal   Collection Time: 08/25/15  4:39 PM  Result Value Ref Range   Glucose-Capillary 339 (H) 65 - 99 mg/dL    Current Facility-Administered Medications  Medication Dose Route  Frequency Provider Last Rate Last Dose  . 0.9 %  sodium chloride infusion  250 mL Intravenous Once Audery Amel, MD      . acetaminophen (TYLENOL) tablet 650 mg  650 mg Oral Q6H PRN Houston Siren, MD   650 mg at 07/31/15 1610   Or  . acetaminophen (TYLENOL) suppository 650 mg  650 mg Rectal Q6H PRN Houston Siren, MD      . allopurinol (ZYLOPRIM) tablet 300 mg  300 mg Oral Daily Houston Siren, MD   300 mg at 08/25/15 1454  . atorvastatin (LIPITOR)  tablet 10 mg  10 mg Oral Daily Houston Siren, MD   10 mg at 08/25/15 1454  . cholecalciferol (VITAMIN D) tablet 800 Units  800 Units Oral Daily Houston Siren, MD   800 Units at 08/25/15 1452  . clonazePAM (KLONOPIN) tablet 1 mg  1 mg Oral QHS Houston Siren, MD   1 mg at 08/25/15 1937  . colchicine tablet 0.6 mg  0.6 mg Oral Daily Houston Siren, MD   0.6 mg at 08/25/15 1456  . diltiazem (CARDIZEM CD) 24 hr capsule 120 mg  120 mg Oral Daily Delfino Lovett, MD   120 mg at 08/25/15 1456  . docusate sodium (COLACE) capsule 200 mg  200 mg Oral BID Auburn Bilberry, MD   200 mg at 08/25/15 1936  . enoxaparin (LOVENOX) injection 40 mg  40 mg Subcutaneous Q24H Houston Siren, MD   40 mg at 08/25/15 1938  . feeding supplement (GLUCERNA SHAKE) (GLUCERNA SHAKE) liquid 237 mL  237 mL Oral TID WC Houston Siren, MD   237 mL at 08/25/15 1700  . finasteride (PROSCAR) tablet 5 mg  5 mg Oral Daily Alford Highland, MD   5 mg at 08/25/15 1454  . hydrALAZINE (APRESOLINE) tablet 50 mg  50 mg Oral 4 times per day Katha Hamming, MD   50 mg at 08/25/15 1937  . insulin aspart (novoLOG) injection 0-9 Units  0-9 Units Subcutaneous TID WC Auburn Bilberry, MD   7 Units at 08/25/15 1718  . insulin glargine (LANTUS) injection 12 Units  12 Units Subcutaneous QHS Auburn Bilberry, MD   12 Units at 08/24/15 2151  . lactulose (CHRONULAC) 10 GM/15ML solution 30 g  30 g Oral Daily PRN Alford Highland, MD   30 g at 08/21/15 2108  . lidocaine (cardiac) 100 mg/20ml (XYLOCAINE) 20  MG/ML injection 2% 4 mg  4 mg Intravenous Once Audery Amel, MD      . lisinopril (PRINIVIL,ZESTRIL) tablet 10 mg  10 mg Oral Daily Alford Highland, MD   10 mg at 08/25/15 0831  . megestrol (MEGACE) 400 MG/10ML suspension 400 mg  400 mg Oral Daily Houston Siren, MD   400 mg at 08/25/15 1456  . metoCLOPramide (REGLAN) injection 10 mg  10 mg Intravenous Once Audery Amel, MD   10 mg at 08/01/15 1300  . metoprolol succinate (TOPROL-XL) 24 hr tablet 200 mg  200 mg Oral Daily Houston Siren, MD   200 mg at 08/25/15 8119  . mirtazapine (REMERON) tablet 45 mg  45 mg Oral QHS Audery Amel, MD   45 mg at 08/25/15 1937  . OLANZapine zydis (ZYPREXA) disintegrating tablet 30 mg  30 mg Oral QHS Audery Amel, MD   30 mg at 08/24/15 2200  . ondansetron (ZOFRAN) tablet 4 mg  4 mg Oral Q6H PRN Houston Siren, MD       Or  . ondansetron (ZOFRAN) injection 4 mg  4 mg Intravenous Q6H PRN Houston Siren, MD      . polyethylene glycol (MIRALAX / GLYCOLAX) packet 17 g  17 g Oral Daily Katha Hamming, MD   17 g at 08/25/15 1457  . senna (SENOKOT) tablet 8.6 mg  1 tablet Oral Daily Auburn Bilberry, MD   8.6 mg at 08/25/15 1453  . sodium chloride 0.9 % bolus 1,000 mL  1,000 mL Intravenous PRN Oralia Manis, MD   1,000 mL at 08/15/15 2218  . tamsulosin (FLOMAX) capsule 0.4 mg  0.4 mg Oral QHS Alford Highland, MD   0.4 mg at 08/25/15 1938  . traZODone (DESYREL) tablet 25 mg  25 mg Oral TID Houston Siren, MD   25 mg at 08/25/15 1937    Musculoskeletal: Strength & Muscle Tone: decreased Gait & Station: He is able to stand but rarely will do it unless forced to by physical therapy Patient leans: N/A  Psychiatric Specialty Exam: Review of Systems  HENT: Negative.   Eyes: Negative.   Respiratory: Negative.   Cardiovascular: Negative.   Gastrointestinal: Negative.   Musculoskeletal: Negative.   Skin: Negative.   Neurological: Positive for weakness.  Psychiatric/Behavioral: Positive for memory loss.  Negative for depression, suicidal ideas, hallucinations and substance abuse. The patient is not nervous/anxious and does not have insomnia.     Blood pressure 143/82, pulse 95, temperature 98.5 F (36.9 C), temperature source Oral, resp. rate 20, height 6' (1.829 m), weight 93.895 kg (207 lb), SpO2 99 %.Body mass index is 28.07 kg/(m^2).  General Appearance: Disheveled  Eye Contact::  Minimal  Speech:  Slow  Volume:  Decreased  Mood:  Euthymic  Affect:  Constricted  Thought Process:  Circumstantial  Orientation:  Full (Time, Place, and Person)  Thought Content:  Negative  Suicidal Thoughts:  No  Homicidal Thoughts:  No  Memory:  Immediate;   Fair Recent;   Fair Remote;   Fair  Judgement:  Impaired  Insight:  Present  Psychomotor Activity:  Psychomotor Retardation  Concentration:  Fair  Recall:  Fair  Fund of Knowledge:Good  Language: Fair  Akathisia:  No  Handed:  Right  AIMS (if indicated):     Assets:  Financial Resources/Insurance Resilience Social Support  ADL's:  Impaired  Cognition: Impaired,  Moderate  Sleep:      Treatment Plan Summary: Medication management and Plan This patient has received 12 ECT treatments and appears to have reached a maximum benefit. The last treatment are to do not seem to have made it clear change for the better. He is certainly better than he was when he was at his worst but he is still very on functional. Doesn't do much of anything on his own initiative. Content most of the time delay in bed with no activity. Given the minimal further improvement from ECT I am considering suggesting that he be transferred back to the psychiatry service. At that point we can discuss with his wife follow-up plans whether that would be for him to go home or to be considered for transfer to a longer term hospital. This was all discussed with the patient. He had no particular response to it.  Disposition: Recommend psychiatric Inpatient admission when medically  cleared. Supportive therapy provided about ongoing stressors. Discussed crisis plan, support from social network, calling 911, coming to the Emergency Department, and calling Suicide Hotline.  Rashanna Christiana 08/25/2015 8:14 PM

## 2015-08-25 NOTE — Anesthesia Preprocedure Evaluation (Signed)
Anesthesia Evaluation  Patient identified by MRN, date of birth, ID band Patient confused    Reviewed: Allergy & Precautions, H&P , NPO status , Patient's Chart, lab work & pertinent test results  History of Anesthesia Complications Negative for: history of anesthetic complications  Airway Mallampati: III       Dental  (+) Poor Dentition   Pulmonary sleep apnea ,     + decreased breath sounds      Cardiovascular hypertension, Pt. on medications +CHF  Normal cardiovascular exam     Neuro/Psych PSYCHIATRIC DISORDERS (bipolar 1) negative neurological ROS     GI/Hepatic negative GI ROS, Neg liver ROS,   Endo/Other  diabetes, Type 1, Insulin Dependent  Renal/GU CRFRenal diseasenegative Renal ROS  negative genitourinary   Musculoskeletal negative musculoskeletal ROS (+)   Abdominal (+) + obese,   Peds  Hematology negative hematology ROS (+)   Anesthesia Other Findings Past Medical History:   Mood swings                                                  Type II or unspecified type diabetes mellitus * 06/02/2014     Other and unspecified hyperlipidemia            06/02/2014     OSA on CPAP                                     06/02/2014     Bilateral lower extremity edema: chronic with * 06/02/2014     Bipolar disorder                                             CHF (congestive heart failure)                               CKD (chronic kidney disease), stage III                      Diabetes mellitus without complication                       Hypertension                                                 Gout                                                         Bipolar 1 disorder                                           OSA on CPAP  Reproductive/Obstetrics negative OB ROS                             Anesthesia Physical  Anesthesia Plan  ASA:  III  Anesthesia Plan: General   Post-op Pain Management:    Induction: Intravenous  Airway Management Planned: Mask  Additional Equipment:   Intra-op Plan:   Post-operative Plan:   Informed Consent: I have reviewed the patients History and Physical, chart, labs and discussed the procedure including the risks, benefits and alternatives for the proposed anesthesia with the patient or authorized representative who has indicated his/her understanding and acceptance.   Dental Advisory Given  Plan Discussed with: Anesthesiologist, CRNA and Surgeon  Anesthesia Plan Comments:         Anesthesia Quick Evaluation  

## 2015-08-25 NOTE — Anesthesia Postprocedure Evaluation (Signed)
  Anesthesia Post-op Note  Patient: Christian [name]  Procedure(s) Performed: * No procedures listed *  Anesthesia type:General  Patient location: PACU  Post pain: Pain level controlled  Post assessment: Post-op Vital signs reviewed, Patient's Cardiovascular Status Stable, Respiratory Function Stable, Patent Airway and No signs of Nausea or vomiting  Post vital signs: Reviewed and stable  Last Vitals:  Filed Vitals:   08/25/15 1323  BP: 143/82  Pulse: 95  Temp: 36.9 C  Resp:     Level of consciousness: awake, alert  and patient cooperative  Complications: No apparent anesthesia complications

## 2015-08-25 NOTE — Procedures (Signed)
ECT SERVICES Physician's Interval Evaluation & Treatment Note  Patient Identification: Christian [name] MRN:  [id number] Date of Evaluation:  08/25/2015 [place] #: [id number]  MADRS:   MMSE:   P.E. Findings:  No to minimal change in physical  Psychiatric Interval Note:  Continues to be extremely withdrawn and depressed on functional  Subjective:  Patient is a 64 y.o. male seen for evaluation for Electroconvulsive Therapy. No specific complaint  Treatment Summary:   []   Right Unilateral             [x]  Bilateral   % Energy : 1.0 ms, 100%   Impedance: 920 ohms  Seizure Energy Index: 1604 V squared  Postictal Suppression Index: 75%  Seizure Concordance Index: 98%  Medications  Pre Shock: Xylocaine 4 mg, labetalol 20 mg, ketamine 120 mg, succinylcholine 120 mg  Post Shock: Labetalol 20 mg  Seizure Duration: 19 seconds by EMG, 24 seconds by EEG   Comments: Patient may be reaching maximum point of improvement. Will reevaluate over the next couple days for further treatment plans   Lungs:  [x]   Clear to auscultation               []  Other:   Heart:    [x]   Regular rhythm             []  irregular rhythm    [x]   Previous H&P reviewed, patient examined and there are NO CHANGES                 []   Previous H&P reviewed, patient examined and there are changes noted.   [name], MD 9/26/201612:13 PM

## 2015-08-26 LAB — CREATININE, SERUM
Creatinine, Ser: 0.91 mg/dL (ref 0.61–1.24)
GFR calc Af Amer: >60 mL/min (ref >60)
GFR calc non Af Amer: >60 mL/min (ref >60)

## 2015-08-26 LAB — GLUCOSE, CAPILLARY
Glucose-Capillary: 148 mg/dL — ABNORMAL HIGH (ref 65–99)
Glucose-Capillary: 196 mg/dL — ABNORMAL HIGH (ref 65–99)
Glucose-Capillary: 191 mg/dL — ABNORMAL HIGH (ref 65–99)
Glucose-Capillary: 251 mg/dL — ABNORMAL HIGH (ref 65–99)

## 2015-08-26 MED ORDER — INSULIN GLARGINE 100 UNIT/ML ~~LOC~~ SOLN: 12.0000 [IU] | Freq: Every day | SUBCUTANEOUS | Status: DC

## 2015-08-26 MED ORDER — LISINOPRIL 10 MG PO TABS: 10.0000 mg | ORAL_TABLET | Freq: Every day | ORAL | Status: DC

## 2015-08-26 MED ORDER — INSULIN ASPART 100 UNIT/ML ~~LOC~~ SOLN: 0.0000 [IU] | Freq: Three times a day (TID) | SUBCUTANEOUS | Status: DC

## 2015-08-26 MED ORDER — OLANZAPINE 15 MG PO TBDP: 30.0000 mg | ORAL_TABLET | Freq: Every day | ORAL | Status: DC

## 2015-08-26 MED ORDER — DOCUSATE SODIUM 100 MG PO CAPS: 200.0000 mg | ORAL_CAPSULE | Freq: Two times a day (BID) | ORAL | Status: DC

## 2015-08-26 MED ORDER — POLYETHYLENE GLYCOL 3350 17 G PO PACK: 17.0000 g | PACK | Freq: Every day | ORAL | Status: DC

## 2015-08-26 MED ORDER — TAMSULOSIN HCL 0.4 MG PO CAPS: 0.4000 mg | ORAL_CAPSULE | Freq: Every day | ORAL | Status: DC

## 2015-08-26 MED ORDER — CLONAZEPAM 1 MG PO TABS: 1.0000 mg | ORAL_TABLET | Freq: Every day | ORAL | Status: DC

## 2015-08-26 MED ORDER — FINASTERIDE 5 MG PO TABS: 5.0000 mg | ORAL_TABLET | Freq: Every day | ORAL | Status: DC

## 2015-08-26 MED ORDER — ONDANSETRON HCL 4 MG PO TABS: 4.0000 mg | ORAL_TABLET | Freq: Four times a day (QID) | ORAL | Status: DC | PRN

## 2015-08-26 NOTE — Progress Notes (Signed)
Physical Therapy Treatment Patient Details Name: Christian [name] MRN: [id number] DOB: 09-17-1951 Today's Date: 08/26/2015    History of Present Illness Pt is a 64 year old male with bipolar disorder with recent suicide attempt and agitation and threats to wife. Pt transfered to this unit for IV access while he recieives ECT treatment.     PT Comments    Pt continues to progress his capacity for functional mobility with each session. Pt was able to ambulate ~300 ft today with +1 assistance and RW. Pt demonstrated better gait mechanics with increased bilat step length and decreased tendency to cross foot over midline during swing through. Pt also demonstrated increased speed of gait today. Pt demonstrated increased functional LE strength with improved times in his 5XSTS test: 16.3 seconds with BUE assist and 18.2 seconds without UE assist ( last week times were 26 and 28 seconds, respectively). Pt declined further activity. Pt will continue to benefit from skilled Pt services in order to advance his functional movement capacity and overall tolerance for activity.   Follow Up Recommendations  SNF     Equipment Recommendations  Rolling walker with 5" wheels    Recommendations for Other Services       Precautions / Restrictions Precautions Precautions: Fall Restrictions Weight Bearing Restrictions: No    Mobility  Bed Mobility Overal bed mobility: Modified Independent Bed Mobility: Supine to Sit     Supine to sit: Modified independent (Device/Increase time) (used bed rail for assistance with supine to sit transfer)     General bed mobility comments: pt displayed impulse when coming from supine to sitting and attempted to stand right away even with PT asking him to stay seated. Need to watch closely when transitioning pt to ensure safety.   Transfers Overall transfer level: Needs assistance Equipment used: Rolling walker (2 wheeled) Transfers: Sit to/from Stand Sit to Stand:  Supervision         General transfer comment: Pt able to perform sit <>stand transfer with modified indpendence; using bed to push himself up into standing and PT providing supervision. Pt demonstrates impulsiveness with transfers.   Ambulation/Gait Ambulation/Gait assistance: Min guard Ambulation Distance (Feet): 300 Feet Assistive device: Rolling walker (2 wheeled)     Gait velocity interpretation: Below normal speed for age/gender General Gait Details: Pt did well today with maintaining feet adequate distance apart and not crossing midline; good step length and speed also demonstrated today.    Stairs            Wheelchair Mobility    Modified Rankin (Stroke Patients Only)       Balance Overall balance assessment: Needs assistance Sitting-balance support: No upper extremity supported;Feet supported Sitting balance-Leahy Scale: Good       Standing balance-Leahy Scale: Good Standing balance comment: no istances of imblance with gait or standing observed today.                     Cognition Arousal/Alertness: Awake/alert Behavior During Therapy: Flat affect;Impulsive Overall Cognitive Status: History of cognitive impairments - at baseline                      Exercises      General Comments        Pertinent Vitals/Pain Pain Assessment: No/denies pain    Home Living                      Prior Function  PT Goals (current goals can now be found in the care plan section) Additional Goals Additional Goal #1: Assess and establish goals for OOB activities as appropriate and patient allows. Progress towards PT goals: Progressing toward goals    Frequency  Min 2X/week    PT Plan Current plan remains appropriate    Co-evaluation             End of Session Equipment Utilized During Treatment: Gait belt Activity Tolerance: Patient tolerated treatment well Patient left: in chair;with call bell/phone within  reach;with nursing/sitter in room     Time: 1610-9604 PT Time Calculation (min) (ACUTE ONLY): 12 min  Charges:                       G CodesGeorgina Peer 08/26/2015, 12:55 PM

## 2015-08-26 NOTE — Discharge Instructions (Signed)
°  DIET:  Diabetic diet and Low fat, Low cholesterol diet  DISCHARGE CONDITION:  Stable  ACTIVITY:  Activity as tolerated  OXYGEN:  Home Oxygen: No.   Oxygen Delivery: room air  DISCHARGE LOCATION:  [facility]    ADDITIONAL DISCHARGE INSTRUCTION:   If you experience worsening of your admission symptoms, develop shortness of breath, life threatening emergency, suicidal or homicidal thoughts you must seek medical attention immediately by calling 911 or calling your MD immediately  if symptoms less severe.  You Must read complete instructions/literature along with all the possible adverse reactions/side effects for all the Medicines you take and that have been prescribed to you. Take any new Medicines after you have completely understood and accpet all the possible adverse reactions/side effects.   Please note  You were cared for by a hospitalist during your hospital stay. If you have any questions about your discharge medications or the care you received while you were in the hospital after you are discharged, you can call the unit and asked to speak with the hospitalist on call if the hospitalist that took care of you is not available. Once you are discharged, your primary care physician will handle any further medical issues. Please note that NO REFILLS for any discharge medications will be authorized once you are discharged, as it is imperative that you return to your primary care physician (or establish a relationship with a primary care physician if you do not have one) for your aftercare needs so that they can reassess your need for medications and monitor your lab values.

## 2015-08-26 NOTE — Plan of Care (Signed)
Problem: Discharge Progression Outcomes Goal: Other Discharge Outcomes/Goals Outcome: Progressing Patient had no c/o pain this shift VSS IVC sitter at beside Patient is tolerating diet well Possible discharge to behavorial after patient has ECT tomorrow

## 2015-08-26 NOTE — Progress Notes (Signed)
Dr. Toni Amend wants patient to wait until tomorrow after ECT treatment on Wednesday. Per Dr. Allena Katz, cancel discharge to [facility].

## 2015-08-26 NOTE — Progress Notes (Signed)
Pt refusing to take Hydralazine this morning & refusing to urinate, pretending he is asleep. Pt will answer questions and nod head but will not cooperate to actually take the medication or sit on side of the bed to void. Will continue to assess and encourage pt.

## 2015-08-26 NOTE — Plan of Care (Signed)
Problem: Discharge Progression Outcomes Goal: Other Discharge Outcomes/Goals Outcome: Progressing Plan of care progress to goals: 1. Alert x3, disoriented to time. No c/o pain. Resting comfortably in bed throughout the night   2. Hemodynamically:             -VSS, afebrile               -requires encouragement to void but does so w/o difficulty using urinal   3. Tolerating diet w/o problem, appetite increasing 4. 1:1 sitter remains at bedside due to IVC on admission

## 2015-08-26 NOTE — Progress Notes (Signed)
Plan for ect treatement according to nurse for tomm d/c past ect treatement tomm

## 2015-08-26 NOTE — Care Management Important Message (Signed)
Important Message  Patient Details  Name: KHIREE NEWMAN MRN: [id number] Date of Birth: 1951-07-17   Medicare Important Message Given:  Yes-third notification given    [name] 08/26/2015, 9:18 AM

## 2015-08-26 NOTE — Discharge Summary (Signed)
Christian [name], 64 y.o., DOB 1951-07-27, MRN [id number]. Admission date: 07/29/2015 Discharge Date 08/26/2015 Primary MD Leanor Rubenstein, MD Admitting Physician Altamese Dilling, MD  Admission Diagnosis  Dehydration and Severe Depression  Discharge Diagnosis   Principal Problem:   Bipolar disorder, current episode depressed, severe, with psychotic features Active Problems:   Dehydration   Pressure ulcer   Protein-calorie malnutrition, severe  diabetes type 2 Unspecified hyperlipidemia Obstructive sleep apnea Chronic bilateral lower extremity edema Gout Hypertension      Hospital Course is a 64 y.o. male with a known history of severe depression, diabetes, chronic kidney disease stage III, history of CHF, bipolar disorder, hypertension, gout, obstructive sleep apnea, who was actually in behavioral medicine for treatment for his underlying depression but was transferred to the medical floor as he was unable to get IV access on the patient to start ECT. Patient was supposed to get ECT but due to poor IV access could not be initiated. Patient was admitted to the medical floor and had a PICC line placed. He was seen by psychiatry and resumed on ECT treatment. Patient has had total of 12 ECT therapies. An psychiatry does not feel that further ECT would benefit the patient. His depression is significantly improved his eating and drinking normally. Patient will be transferred back to behavioral medicine for further treatment.           Consults  psychiatry  Significant Tests:  See full reports for all details      Dg Chest 1 View  07/29/2015   CLINICAL DATA:  PICC line placement  EXAM: CHEST  1 VIEW  COMPARISON:  07/25/2015  FINDINGS: The heart size and mediastinal contours are within normal limits. Both lungs are clear. The visualized skeletal structures are unremarkable. Right-sided PICC line in place with tip over the mid SVC. No pneumothorax.  IMPRESSION: Right-sided PICC line  in place with tip over the mid SVC.   Electronically Signed   By: Christiana Pellant M.D.   On: 07/29/2015 23:41       Today   Subjective:   Christian [name]  feels better denies any complaints  Objective:   Blood pressure 122/79, pulse 93, temperature 98.6 F (37 C), temperature source Oral, resp. rate 18, height 6' (1.829 m), weight 93.895 kg (207 lb), SpO2 100 %.  .  Intake/Output Summary (Last 24 hours) at 08/26/15 1058 Last data filed at 08/26/15 0710  Gross per 24 hour  Intake    660 ml  Output   1150 ml  Net   -490 ml    Exam VITAL SIGNS: Blood pressure 122/79, pulse 93, temperature 98.6 F (37 C), temperature source Oral, resp. rate 18, height 6' (1.829 m), weight 93.895 kg (207 lb), SpO2 100 %.  GENERAL:  64 y.o.-year-old patient lying in the bed with no acute distress.  EYES: Pupils equal, round, reactive to light and accommodation. No scleral icterus. Extraocular muscles intact.  HEENT: Head atraumatic, normocephalic. Oropharynx and nasopharynx clear.  NECK:  Supple, no jugular venous distention. No thyroid enlargement, no tenderness.  LUNGS: Normal breath sounds bilaterally, no wheezing, rales,rhonchi or crepitation. No use of accessory muscles of respiration.  CARDIOVASCULAR: S1, S2 normal. No murmurs, rubs, or gallops.  ABDOMEN: Soft, nontender, nondistended. Bowel sounds present. No organomegaly or mass.  EXTREMITIES: No pedal edema, cyanosis, or clubbing.  NEUROLOGIC: Cranial nerves II through XII are intact. Muscle strength 5/5 in all extremities. Sensation intact. Gait not checked.  PSYCHIATRIC: The patient is alert and  oriented x 3.  SKIN: No obvious rash, lesion, or ulcer.   Data Review     CBC w Diff: Lab Results  Component Value Date   WBC 4.4 08/03/2015   WBC 2.4* 08/10/2014   HGB 13.4 08/03/2015   HGB 12.8* 08/10/2014   HCT 40.1 08/03/2015   HCT 40.1 08/10/2014   PLT 173 08/03/2015   PLT 143* 08/10/2014   LYMPHOPCT 10 07/20/2015   MONOPCT 11  07/20/2015   EOSPCT 0 07/20/2015   BASOPCT 0 07/20/2015   CMP: Lab Results  Component Value Date   NA 138 08/20/2015   NA 139 08/10/2014   K 4.1 08/20/2015   K 4.3 08/10/2014   CL 109 08/20/2015   CL 105 08/10/2014   CO2 26 08/20/2015   CO2 27 08/10/2014   BUN 18 08/20/2015   BUN 13 08/10/2014   CREATININE 0.91 08/26/2015   CREATININE 1.25 08/10/2014   PROT 7.3 07/09/2015   PROT 7.0 08/10/2014   ALBUMIN 3.8 07/09/2015   ALBUMIN 3.3* 08/10/2014   BILITOT 0.6 07/09/2015   BILITOT 0.3 08/10/2014   ALKPHOS 94 07/09/2015   ALKPHOS 81 08/10/2014   AST 40 07/09/2015   AST 96* 08/10/2014   ALT 25 07/09/2015   ALT 187* 08/10/2014  .  Micro Results No results found for this or any previous visit (from the past 240 hour(s)).      Code Status Orders        Start     Ordered   07/29/15 2027  Full code   Continuous     07/29/15 2028            Discharge Medications     Medication List    STOP taking these medications        OLANZapine injection  Commonly known as:  ZYPREXA  Replaced by:  olanzapine zydis 15 MG disintegrating tablet      TAKE these medications        allopurinol 300 MG tablet  Commonly known as:  ZYLOPRIM  Take 1 tablet (300 mg total) by mouth daily. For gout     amLODipine 10 MG tablet  Commonly known as:  NORVASC  Take 10 mg by mouth daily.     aspirin EC 81 MG tablet  Take 81 mg by mouth daily.     atorvastatin 10 MG tablet  Commonly known as:  LIPITOR  Take 1 tablet (10 mg total) by mouth daily. For high cholesterol     cholecalciferol 400 UNITS Tabs tablet  Commonly known as:  VITAMIN D  Take 800 Units by mouth daily.     clonazePAM 1 MG tablet  Commonly known as:  KLONOPIN  Take 1 tablet (1 mg total) by mouth at bedtime.     colchicine 0.6 MG tablet  Take 1 tablet (0.6 mg total) by mouth 2 (two) times daily. For Gout pain     diltiazem 120 MG 24 hr capsule  Commonly known as:  CARDIZEM CD  Take 1 capsule (120 mg  total) by mouth daily.     docusate sodium 100 MG capsule  Commonly known as:  COLACE  Take 2 capsules (200 mg total) by mouth 2 (two) times daily.     feeding supplement (GLUCERNA SHAKE) Liqd  Take 237 mLs by mouth 3 (three) times daily with meals.     finasteride 5 MG tablet  Commonly known as:  PROSCAR  Take 1 tablet (5 mg total) by mouth daily.  furosemide 20 MG tablet  Commonly known as:  LASIX  Take 20 mg by mouth daily.     hydrALAZINE 25 MG tablet  Commonly known as:  APRESOLINE  Take 1 tablet (25 mg total) by mouth every 6 (six) hours.     insulin aspart 100 UNIT/ML injection  Commonly known as:  novoLOG  Inject 0-9 Units into the skin 3 (three) times daily with meals.     insulin glargine 100 UNIT/ML injection  Commonly known as:  LANTUS  Inject 0.12 mLs (12 Units total) into the skin at bedtime.     lisinopril 10 MG tablet  Commonly known as:  PRINIVIL,ZESTRIL  Take 1 tablet (10 mg total) by mouth daily.     megestrol 400 MG/10ML suspension  Commonly known as:  MEGACE  Take 10 mLs (400 mg total) by mouth daily.     metoprolol 200 MG 24 hr tablet  Commonly known as:  TOPROL-XL  Take 1 tablet (200 mg total) by mouth daily. For high blood pressure     mirtazapine 30 MG tablet  Commonly known as:  REMERON  Take 30 mg by mouth at bedtime.     olanzapine zydis 15 MG disintegrating tablet  Commonly known as:  ZYPREXA  Take 2 tablets (30 mg total) by mouth at bedtime.     ondansetron 4 MG tablet  Commonly known as:  ZOFRAN  Take 1 tablet (4 mg total) by mouth every 6 (six) hours as needed for nausea.     polyethylene glycol packet  Commonly known as:  MIRALAX / GLYCOLAX  Take 17 g by mouth daily.     tamsulosin 0.4 MG Caps capsule  Commonly known as:  FLOMAX  Take 1 capsule (0.4 mg total) by mouth at bedtime.     traZODone 50 MG tablet  Commonly known as:  DESYREL  Take 0.5 tablets (25 mg total) by mouth 3 (three) times daily.            Total Time in preparing paper work, data evaluation and todays exam - 35 minutes  Auburn Bilberry M.D on 08/26/2015 at 10:58 AM  [facility]   Office  [phone]

## 2015-08-27 ENCOUNTER — Other Ambulatory Visit: Payer: Self-pay

## 2015-08-27 ENCOUNTER — Inpatient Hospital Stay
Admit: 2015-08-27 | Discharge: 2015-08-31 | DRG: 885 | Disposition: A | Discharge status: Other Acute Inpatient Hospital | Payer: Commercial Managed Care - HMO | Attending: Psychiatry | Admitting: Psychiatry

## 2015-08-27 ENCOUNTER — Inpatient Hospital Stay: Payer: Commercial Managed Care - HMO | Admitting: Anesthesiology

## 2015-08-27 ENCOUNTER — Encounter: Payer: Self-pay | Admitting: Anesthesiology

## 2015-08-27 ENCOUNTER — Inpatient Hospital Stay: Payer: Commercial Managed Care - HMO

## 2015-08-27 DIAGNOSIS — E860 Dehydration: Secondary | ICD-10-CM | POA: Diagnosis not present

## 2015-08-27 DIAGNOSIS — I951 Orthostatic hypotension: Secondary | ICD-10-CM | POA: Diagnosis not present

## 2015-08-27 DIAGNOSIS — R45851 Suicidal ideations: Secondary | ICD-10-CM | POA: Diagnosis not present

## 2015-08-27 DIAGNOSIS — R627 Adult failure to thrive: Secondary | ICD-10-CM | POA: Diagnosis present

## 2015-08-27 DIAGNOSIS — F82 Specific developmental disorder of motor function: Secondary | ICD-10-CM | POA: Diagnosis present

## 2015-08-27 DIAGNOSIS — Z794 Long term (current) use of insulin: Secondary | ICD-10-CM

## 2015-08-27 DIAGNOSIS — E43 Unspecified severe protein-calorie malnutrition: Secondary | ICD-10-CM | POA: Diagnosis present

## 2015-08-27 DIAGNOSIS — Z915 Personal history of self-harm: Secondary | ICD-10-CM

## 2015-08-27 DIAGNOSIS — E1122 Type 2 diabetes mellitus with diabetic chronic kidney disease: Secondary | ICD-10-CM | POA: Diagnosis present

## 2015-08-27 DIAGNOSIS — Z452 Encounter for adjustment and management of vascular access device: Secondary | ICD-10-CM | POA: Diagnosis not present

## 2015-08-27 DIAGNOSIS — Z7982 Long term (current) use of aspirin: Secondary | ICD-10-CM | POA: Diagnosis not present

## 2015-08-27 DIAGNOSIS — J189 Pneumonia, unspecified organism: Secondary | ICD-10-CM | POA: Diagnosis not present

## 2015-08-27 DIAGNOSIS — F319 Bipolar disorder, unspecified: Secondary | ICD-10-CM | POA: Diagnosis not present

## 2015-08-27 DIAGNOSIS — M109 Gout, unspecified: Secondary | ICD-10-CM | POA: Diagnosis not present

## 2015-08-27 DIAGNOSIS — E119 Type 2 diabetes mellitus without complications: Secondary | ICD-10-CM

## 2015-08-27 DIAGNOSIS — R55 Syncope and collapse: Secondary | ICD-10-CM | POA: Diagnosis not present

## 2015-08-27 DIAGNOSIS — I509 Heart failure, unspecified: Secondary | ICD-10-CM | POA: Diagnosis not present

## 2015-08-27 DIAGNOSIS — F3130 Bipolar disorder, current episode depressed, mild or moderate severity, unspecified: Secondary | ICD-10-CM | POA: Diagnosis present

## 2015-08-27 DIAGNOSIS — I130 Hypertensive heart and chronic kidney disease with heart failure and stage 1 through stage 4 chronic kidney disease, or unspecified chronic kidney disease: Secondary | ICD-10-CM | POA: Diagnosis present

## 2015-08-27 DIAGNOSIS — Z79899 Other long term (current) drug therapy: Secondary | ICD-10-CM

## 2015-08-27 DIAGNOSIS — L8990 Pressure ulcer of unspecified site, unspecified stage: Secondary | ICD-10-CM | POA: Diagnosis present

## 2015-08-27 DIAGNOSIS — G4733 Obstructive sleep apnea (adult) (pediatric): Secondary | ICD-10-CM | POA: Diagnosis present

## 2015-08-27 DIAGNOSIS — N39490 Overflow incontinence: Secondary | ICD-10-CM | POA: Diagnosis present

## 2015-08-27 DIAGNOSIS — F3131 Bipolar disorder, current episode depressed, mild: Secondary | ICD-10-CM | POA: Diagnosis present

## 2015-08-27 DIAGNOSIS — E785 Hyperlipidemia, unspecified: Secondary | ICD-10-CM | POA: Diagnosis present

## 2015-08-27 DIAGNOSIS — Z8249 Family history of ischemic heart disease and other diseases of the circulatory system: Secondary | ICD-10-CM

## 2015-08-27 DIAGNOSIS — N400 Enlarged prostate without lower urinary tract symptoms: Secondary | ICD-10-CM | POA: Diagnosis present

## 2015-08-27 DIAGNOSIS — K5900 Constipation, unspecified: Secondary | ICD-10-CM | POA: Diagnosis present

## 2015-08-27 DIAGNOSIS — N183 Chronic kidney disease, stage 3 (moderate): Secondary | ICD-10-CM | POA: Diagnosis present

## 2015-08-27 DIAGNOSIS — F333 Major depressive disorder, recurrent, severe with psychotic symptoms: Secondary | ICD-10-CM | POA: Diagnosis not present

## 2015-08-27 DIAGNOSIS — I248 Other forms of acute ischemic heart disease: Secondary | ICD-10-CM | POA: Diagnosis not present

## 2015-08-27 DIAGNOSIS — F315 Bipolar disorder, current episode depressed, severe, with psychotic features: Principal | ICD-10-CM | POA: Diagnosis present

## 2015-08-27 DIAGNOSIS — I82413 Acute embolism and thrombosis of femoral vein, bilateral: Secondary | ICD-10-CM | POA: Diagnosis not present

## 2015-08-27 DIAGNOSIS — R000 Tachycardia, unspecified: Secondary | ICD-10-CM | POA: Diagnosis not present

## 2015-08-27 DIAGNOSIS — Z841 Family history of disorders of kidney and ureter: Secondary | ICD-10-CM

## 2015-08-27 DIAGNOSIS — J9601 Acute respiratory failure with hypoxia: Secondary | ICD-10-CM | POA: Diagnosis not present

## 2015-08-27 DIAGNOSIS — F312 Bipolar disorder, current episode manic severe with psychotic features: Secondary | ICD-10-CM | POA: Diagnosis present

## 2015-08-27 DIAGNOSIS — Z809 Family history of malignant neoplasm, unspecified: Secondary | ICD-10-CM | POA: Diagnosis not present

## 2015-08-27 DIAGNOSIS — Z820 Family history of epilepsy and other diseases of the nervous system: Secondary | ICD-10-CM | POA: Diagnosis not present

## 2015-08-27 DIAGNOSIS — G4710 Hypersomnia, unspecified: Secondary | ICD-10-CM | POA: Diagnosis present

## 2015-08-27 DIAGNOSIS — G4700 Insomnia, unspecified: Secondary | ICD-10-CM | POA: Diagnosis present

## 2015-08-27 DIAGNOSIS — I2609 Other pulmonary embolism with acute cor pulmonale: Secondary | ICD-10-CM | POA: Diagnosis not present

## 2015-08-27 DIAGNOSIS — R0600 Dyspnea, unspecified: Secondary | ICD-10-CM | POA: Diagnosis not present

## 2015-08-27 DIAGNOSIS — I10 Essential (primary) hypertension: Secondary | ICD-10-CM | POA: Diagnosis not present

## 2015-08-27 LAB — GLUCOSE, CAPILLARY
Glucose-Capillary: 134 mg/dL — ABNORMAL HIGH (ref 65–99)
Glucose-Capillary: 134 mg/dL — ABNORMAL HIGH (ref 65–99)

## 2015-08-27 MED ORDER — SUCCINYLCHOLINE CHLORIDE 20 MG/ML IJ SOLN
120.0000 mg | Freq: Once | INTRAMUSCULAR | Status: AC
  Administered 2015-08-27: 120 mg via INTRAVENOUS
  Filled 2015-08-27: qty 6

## 2015-08-27 MED ORDER — SUCCINYLCHOLINE CHLORIDE 20 MG/ML IJ SOLN: 120.0000 mg | Freq: Once | INTRAMUSCULAR | Status: DC

## 2015-08-27 MED ORDER — KETAMINE HCL 10 MG/ML IJ SOLN: 120.0000 mg | Freq: Once | INTRAMUSCULAR | Status: DC

## 2015-08-27 MED ORDER — LABETALOL HCL 5 MG/ML IV SOLN
20.0000 mg | Freq: Once | INTRAVENOUS | Status: DC
Start: 2015-08-27 — End: 2015-08-31

## 2015-08-27 MED ORDER — LIDOCAINE HCL (CARDIAC) 20 MG/ML IV SOLN
4.0000 mg | Freq: Once | INTRAVENOUS | Status: AC
  Administered 2015-08-27: 4 mg via INTRAVENOUS
  Filled 2015-08-27: qty 0.2

## 2015-08-27 MED ORDER — SODIUM CHLORIDE 0.9 % IV SOLN: 250.0000 mL | Freq: Once | INTRAVENOUS | Status: DC

## 2015-08-27 MED ORDER — NITROGLYCERIN 0.2 MG/ML ON CALL CATH LAB
100.0000 ug | Freq: Once | INTRAVENOUS | Status: DC
  Filled 2015-08-27: qty 1

## 2015-08-27 MED ORDER — LABETALOL HCL 5 MG/ML IV SOLN
20.0000 mg | Freq: Once | INTRAVENOUS | Status: AC
  Administered 2015-08-27: 13:00:00 20 mg via INTRAVENOUS
  Filled 2015-08-27: qty 4

## 2015-08-27 MED ORDER — NITROGLYCERIN 0.2 MG/ML ON CALL CATH LAB
50.0000 ug | Freq: Once | INTRAVENOUS | Status: AC
  Administered 2015-08-27: 50 ug via INTRAVENOUS

## 2015-08-27 MED ORDER — LABETALOL HCL 5 MG/ML IV SOLN
20.0000 mg | Freq: Once | INTRAVENOUS | Status: AC
  Administered 2015-08-27: 13:00:00 20 mg via INTRAVENOUS

## 2015-08-27 MED ORDER — NITROGLYCERIN 1 MG/10 ML FOR IR/CATH LAB: 50.0000 ug | Freq: Once | INTRA_ARTERIAL | Status: DC

## 2015-08-27 MED ORDER — LIDOCAINE HCL (CARDIAC) 20 MG/ML IV SOLN: 4.0000 mg | Freq: Once | INTRAVENOUS | Status: DC

## 2015-08-27 MED ORDER — DEXTROSE 5 % IV SOLN
INTRAVENOUS | Status: DC | PRN
  Administered 2015-08-27: 12:00:00 via INTRAVENOUS

## 2015-08-27 MED ORDER — KETAMINE HCL 10 MG/ML IJ SOLN
120.0000 mg | Freq: Once | INTRAMUSCULAR | Status: AC
  Administered 2015-08-27: 13:00:00 120 mg via INTRAVENOUS

## 2015-08-27 MED ORDER — SODIUM CHLORIDE 0.9 % IV SOLN
250.0000 mL | Freq: Once | INTRAVENOUS | Status: AC
  Administered 2015-08-29: 250 mL via INTRAVENOUS

## 2015-08-27 NOTE — [facility] Suicide Risk Assessment (Signed)
[facility] Admission Suicide Risk Assessment   Nursing information obtained from:    Demographic factors:    Current Mental Status:    Loss Factors:    Historical Factors:    Risk Reduction Factors:    Total Time spent with patient: 45 minutes Principal Problem: <principal problem not specified> Diagnosis:   Patient Active Problem List   Diagnosis Date Noted  . Bipolar I disorder, severe, current or most recent episode depressed, with psychotic features [F31.5]   . Pressure ulcer [L89.90] 07/30/2015  . Protein-calorie malnutrition, severe [E43] 07/30/2015  . Dehydration [E86.0] 07/29/2015  . Bipolar disorder, current episode depressed, severe, with psychotic features [F31.5] 07/09/2015  . Diabetes [E11.9] 07/09/2015  . UTI (urinary tract infection) [N39.0] 06/19/2014  . Positive blood culture [R78.81] 06/19/2014  . Bipolar affective disorder, current episode manic with psychotic symptoms [F31.2] 06/17/2014  . Type II or unspecified type diabetes mellitus with unspecified complication, uncontrolled [E11.8] 06/02/2014  . Other and unspecified hyperlipidemia [E78.5] 06/02/2014  . OSA on CPAP [G47.33] 06/02/2014  . 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 Gout [M10.9] 06/02/2014  . Hypertension [I10]      Continued Clinical Symptoms:  Alcohol Use Disorder Identification Test Final Score (AUDIT): 0 The "Alcohol Use Disorders Identification Test", Guidelines for Use in Primary Care, Second Edition.  World Science writer [facility]). Score between 0-7:  no or low risk or alcohol related problems. Score between 8-15:  moderate risk of alcohol related problems. Score between 16-19:  high risk of alcohol related problems. Score 20 or above:  warrants further diagnostic evaluation for alcohol dependence and treatment.   CLINICAL FACTORS:   Bipolar Disorder:   Depressive phase   Musculoskeletal: Strength & Muscle Tone: decreased Gait &  Station: normal Patient leans: N/A  Psychiatric Specialty Exam: Physical Exam  ROS  Blood pressure 114/75, pulse 90, temperature 97.6 F (36.4 C), temperature source Oral, resp. rate 18, height 6' (1.829 m), weight 97.07 kg (214 lb), SpO2 99 %.Body mass index is 29.02 kg/(m^2).  General Appearance: Casual  Eye Contact::  Fair  Speech:  Slow  Volume:  Decreased  Mood:  Euthymic  Affect:  Depressed  Thought Process:  Linear  Orientation:  Full (Time, Place, and Person)  Thought Content:  Delusions  Suicidal Thoughts:  Yes.  without intent/plan  Homicidal Thoughts:  No  Memory:  Immediate;   Fair Recent;   Good Remote;   Fair  Judgement:  Intact  Insight:  Shallow  Psychomotor Activity:  Decreased  Concentration:  Fair  Recall:  [employer] of Knowledge:Fair  Language: Fair  Akathisia:  No  Handed:  Right  AIMS (if indicated):     Assets:  Desire for Improvement Financial Resources/Insurance Housing Resilience Social Support  Sleep:     Cognition: Impaired,  Mild  ADL's:  Intact     COGNITIVE FEATURES THAT CONTRIBUTE TO RISK:  Closed-mindedness and Thought constriction (tunnel vision)    SUICIDE RISK:   Moderate:  Frequent suicidal ideation with limited intensity, and duration, some specificity in terms of plans, no associated intent, good self-control, limited dysphoria/symptomatology, some risk factors present, and identifiable protective factors, including available and accessible social support.  PLAN OF CARE: Patient is receiving bilateral ECT treatment. Overall he is a significant long-term risk of depression and suicide attempt given that he has had multiple suicide attempts in the past. Symptoms are improving but he is still having mood congruent depressive delusions and depressed  mood. Responding fairly well to medication and ECT treatment right now. We hope to continue bilateral ECT as long as we are seeing some improvement. Discharge planning is still unclear  as he is not able to go home given his current condition.  Medical Decision Making:  Established Problem, Stable/Improving (1), Review of Psycho-Social Stressors (1), Review or order clinical lab tests (1) and Review of Medication Regimen & Side Effects (2)  I certify that inpatient services furnished can reasonably be expected to improve the patient's condition.   Christian [name] 08/27/2015, 7:58 PM

## 2015-08-27 NOTE — Transfer of Care (Signed)
Immediate Anesthesia Transfer of Care Note  Patient: Christian [name]  Procedure(s) Performed: * No procedures listed *  Patient Location: PACU  Anesthesia Type:general  Level of Consciousness: sedated and patient cooperative  Airway & Oxygen Therapy: Patient Spontanous Breathing and Patient connected to face mask oxygen  Post-op Assessment: Report given to RN and Post -op Vital signs reviewed and stable  Post vital signs: Reviewed and stable  Last Vitals:  Filed Vitals:   08/27/15 1259  BP: 153/93  Pulse: 83  Temp: 37.6 C  Resp: 16    Complications: No apparent anesthesia complications

## 2015-08-27 NOTE — Anesthesia Procedure Notes (Signed)
Performed by: Jetaime Pinnix Pre-anesthesia Checklist: Patient identified, Emergency Drugs available, Suction available and Patient being monitored Patient Re-evaluated:Patient Re-evaluated prior to inductionOxygen Delivery Method: Circle system utilized Preoxygenation: Pre-oxygenation with 100% oxygen Intubation Type: IV induction Ventilation: Mask ventilation without difficulty and Mask ventilation throughout procedure Airway Equipment and Method: Bite block Placement Confirmation: positive ETCO2 Dental Injury: Teeth and Oropharynx as per pre-operative assessment        

## 2015-08-27 NOTE — Anesthesia Preprocedure Evaluation (Signed)
Anesthesia Evaluation  Patient identified by MRN, date of birth, ID band Patient confused    Reviewed: Allergy & Precautions, H&P , NPO status , Patient's Chart, lab work & pertinent test results  History of Anesthesia Complications Negative for: history of anesthetic complications  Airway Mallampati: III       Dental  (+) Poor Dentition   Pulmonary sleep apnea ,     + decreased breath sounds      Cardiovascular hypertension, Pt. on medications +CHF  Normal cardiovascular exam     Neuro/Psych PSYCHIATRIC DISORDERS (bipolar 1) negative neurological ROS     GI/Hepatic negative GI ROS, Neg liver ROS,   Endo/Other  diabetes, Type 1, Insulin Dependent  Renal/GU CRFRenal diseasenegative Renal ROS  negative genitourinary   Musculoskeletal negative musculoskeletal ROS (+)   Abdominal (+) + obese,   Peds  Hematology negative hematology ROS (+)   Anesthesia Other Findings Past Medical History:   Mood swings                                                  Type II or unspecified type diabetes mellitus * 06/02/2014     Other and unspecified hyperlipidemia            06/02/2014     OSA on CPAP                                     06/02/2014     Bilateral lower extremity edema: chronic with * 06/02/2014     Bipolar disorder                                             CHF (congestive heart failure)                               CKD (chronic kidney disease), stage III                      Diabetes mellitus without complication                       Hypertension                                                 Gout                                                         Bipolar 1 disorder                                           OSA on CPAP  Reproductive/Obstetrics negative OB ROS                             Anesthesia Physical  Anesthesia Plan  ASA:  III  Anesthesia Plan: General   Post-op Pain Management:    Induction: Intravenous  Airway Management Planned: Mask  Additional Equipment:   Intra-op Plan:   Post-operative Plan:   Informed Consent: I have reviewed the patients History and Physical, chart, labs and discussed the procedure including the risks, benefits and alternatives for the proposed anesthesia with the patient or authorized representative who has indicated his/her understanding and acceptance.   Dental Advisory Given  Plan Discussed with: Anesthesiologist, CRNA and Surgeon  Anesthesia Plan Comments:         Anesthesia Quick Evaluation  

## 2015-08-27 NOTE — Plan of Care (Signed)
Problem: Discharge Progression Outcomes Goal: Other Discharge Outcomes/Goals Plan of care progress to goal: - No complaints of pain. - Continue IVC sitter. - Possible d/c to behavioral med today after ECT. Will continue to monitor.

## 2015-08-27 NOTE — Progress Notes (Signed)
Patient discharged to [facility]. VSS. Picc line removed and in tact. Report given to [facility]. Escorted by Producer, television/film/video.

## 2015-08-27 NOTE — Procedures (Signed)
ECT SERVICES Physician's Interval Evaluation & Treatment Note  Patient Identification: Christian [name] MRN:  [id number] Date of Evaluation:  08/27/2015 TX #: [id number]  MADRS: 26  MMSE: 30  P.E. Findings:  No new physical findings except that we have weighed him and he has gained about 8 pounds over the course of treatment.  Psychiatric Interval Note:  Much more awake alert and cooperative although still depressed and expresses delusional thought to his wife  Subjective:  Patient is a 64 y.o. male seen for evaluation for Electroconvulsive Therapy. He has no specific complaint  Treatment Summary:   []   Right Unilateral             [x]  Bilateral   % Energy : 1.0 ms, 100%   Impedance: 640 ohms  Seizure Energy Index: 1010 V squared  Postictal Suppression Index: 24%  Seizure Concordance Index: 95%  Medications  Pre Shock: Xylocaine 4 mg, labetalol 20 mg, ketamine 120 mg, succinylcholine 120 mg  Post Shock: Labetalol 20 mg, nitroglycerin 50 g  Seizure Duration: 15 seconds by EMG, machine reads 27 seconds by EEG I think it's shorter than that   Comments: Follow-up treatment scheduled for Friday . Patient continues to be extremely depressed and nonfunctional although clearly still improving the progress notes  Lungs:  [x]   Clear to auscultation               []  Other:   Heart:    [x]   Regular rhythm             []  irregular rhythm    [x]   Previous H&P reviewed, patient examined and there are NO CHANGES                 []   Previous H&P reviewed, patient examined and there are changes noted.   [name], MD 9/28/201612:25 PM

## 2015-08-27 NOTE — H&P (Signed)
Christian [name] is an 64 y.o. male.   Chief Complaint: Continues to express depression and intermittent delusional thought although less paranoid and less agitated. Bipolar disorder with severe depression HPI: Continued ECT treatment. Bilateral treatment use showing ongoing improvement  Past Medical History  Diagnosis Date  . Mood swings   . Type II or unspecified type diabetes mellitus with unspecified complication, uncontrolled 06/02/2014  . Other and unspecified hyperlipidemia 06/02/2014  . OSA on CPAP 06/02/2014  . 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 Bipolar disorder   . CHF (congestive heart failure)   . CKD (chronic kidney disease), stage III   . Diabetes mellitus without complication   . Hypertension   . Gout   . Bipolar 1 disorder   . OSA on CPAP     History reviewed. No pertinent past surgical history.  Family History  Problem Relation Age of Onset  . Dementia Mother   . Arthritis Father   . Heart failure Neg Hx   . Kidney failure Neg Hx   . Cancer Neg Hx    Social History:  reports that he has never smoked. He does not have any smokeless tobacco history on file. He reports that he does not drink alcohol or use illicit drugs.  Allergies: No Known Allergies  Medications Prior to Admission  Medication Sig Dispense Refill  . allopurinol (ZYLOPRIM) 300 MG tablet Take 1 tablet (300 mg total) by mouth daily. For gout    . amLODipine (NORVASC) 10 MG tablet Take 10 mg by mouth daily.    Marland Kitchen atorvastatin (LIPITOR) 10 MG tablet Take 1 tablet (10 mg total) by mouth daily. For high cholesterol    . clonazePAM (KLONOPIN) 1 MG tablet Take 1 tablet (1 mg total) by mouth at bedtime. (Patient taking differently: Take 0.5 mg by mouth 2 (two) times daily. ) 30 tablet 0  . diltiazem (CARDIZEM CD) 120 MG 24 hr capsule Take 1 capsule (120 mg total) by mouth daily. 30 capsule 0  . hydrALAZINE (APRESOLINE) 25 MG tablet Take 1 tablet (25 mg total) by  mouth every 6 (six) hours.    . metoprolol (TOPROL-XL) 200 MG 24 hr tablet Take 1 tablet (200 mg total) by mouth daily. For high blood pressure (Patient taking differently: Take 100 mg by mouth 2 (two) times daily. For high blood pressure)    . mirtazapine (REMERON) 30 MG tablet Take 30 mg by mouth at bedtime.    . traZODone (DESYREL) 50 MG tablet Take 0.5 tablets (25 mg total) by mouth 3 (three) times daily. (Patient taking differently: Take 200 mg by mouth at bedtime. )    . aspirin EC 81 MG tablet Take 81 mg by mouth daily.    . cholecalciferol (VITAMIN D) 400 UNITS TABS tablet Take 800 Units by mouth daily.    . colchicine 0.6 MG tablet Take 1 tablet (0.6 mg total) by mouth 2 (two) times daily. For Gout pain (Patient taking differently: Take 0.6 mg by mouth daily. For Gout pain)    . feeding supplement, GLUCERNA SHAKE, (GLUCERNA SHAKE) LIQD Take 237 mLs by mouth 3 (three) times daily with meals.  0  . furosemide (LASIX) 20 MG tablet Take 20 mg by mouth daily.    . megestrol (MEGACE) 400 MG/10ML suspension Take 10 mLs (400 mg total) by mouth daily. 240 mL 0  . OLANZapine (ZYPREXA) injection Inject 10 mg into the muscle 2 (two) times daily before a meal. 1  each     Results for orders placed or performed during the hospital encounter of 07/29/15 (from the past 48 hour(s))  Glucose, capillary     Status: None   Collection Time: 08/25/15  1:30 PM  Result Value Ref Range   Glucose-Capillary 96 65 - 99 mg/dL  Glucose, capillary     Status: Abnormal   Collection Time: 08/25/15  4:39 PM  Result Value Ref Range   Glucose-Capillary 339 (H) 65 - 99 mg/dL  Glucose, capillary     Status: Abnormal   Collection Time: 08/25/15 10:22 PM  Result Value Ref Range   Glucose-Capillary 253 (H) 65 - 99 mg/dL  Creatinine, serum     Status: None   Collection Time: 08/26/15  5:16 AM  Result Value Ref Range   Creatinine, Ser 0.91 0.61 - 1.24 mg/dL   GFR calc non Af Amer >60 >60 mL/min   GFR calc Af Amer >60  >60 mL/min    Comment: (NOTE) The eGFR has been calculated using the CKD EPI equation. This calculation has not been validated in all clinical situations. eGFR's persistently <60 mL/min signify possible Chronic Kidney Disease.   Glucose, capillary     Status: Abnormal   Collection Time: 08/26/15  7:14 AM  Result Value Ref Range   Glucose-Capillary 148 (H) 65 - 99 mg/dL  Glucose, capillary     Status: Abnormal   Collection Time: 08/26/15 12:00 PM  Result Value Ref Range   Glucose-Capillary 196 (H) 65 - 99 mg/dL  Glucose, capillary     Status: Abnormal   Collection Time: 08/26/15  5:00 PM  Result Value Ref Range   Glucose-Capillary 191 (H) 65 - 99 mg/dL  Glucose, capillary     Status: Abnormal   Collection Time: 08/26/15  9:08 PM  Result Value Ref Range   Glucose-Capillary 251 (H) 65 - 99 mg/dL  Glucose, capillary     Status: Abnormal   Collection Time: 08/27/15  8:12 AM  Result Value Ref Range   Glucose-Capillary 134 (H) 65 - 99 mg/dL   No results found.  Review of Systems  Constitutional: Negative.   HENT: Negative.   Eyes: Negative.   Respiratory: Negative.   Cardiovascular: Negative.   Gastrointestinal: Negative.   Musculoskeletal: Negative.   Skin: Negative.   Neurological: Negative.   Psychiatric/Behavioral: Positive for depression and memory loss. Negative for suicidal ideas, hallucinations and substance abuse. The patient is not nervous/anxious and does not have insomnia.     Blood pressure 136/89, pulse 70, temperature 98.5 F (36.9 C), temperature source Oral, resp. rate 18, height 6' (1.829 m), weight 97.523 kg (215 lb), SpO2 100 %. Physical Exam  Nursing note and vitals reviewed. Constitutional: He appears well-developed and well-nourished.  HENT:  Head: Normocephalic and atraumatic.  Eyes: Conjunctivae are normal. Pupils are equal, round, and reactive to light.  Neck: Normal range of motion.  Cardiovascular: Normal heart sounds.   Respiratory: Effort  normal.  GI: Soft.  Musculoskeletal: Normal range of motion.  Neurological: He is alert.  Skin: Skin is warm and dry.  Psychiatric: His affect is blunt. His speech is delayed. He is slowed. Thought content is delusional. Cognition and memory are impaired. He expresses inappropriate judgment. He exhibits a depressed mood.     Assessment/Plan Plan is for continued bilateral ECT after discussion with wife today  Alethia Berthold 08/27/2015, 12:22 PM

## 2015-08-27 NOTE — Progress Notes (Signed)
Patient pleasant and cooperative during admission assessment. Patient denies SI/HI at this time. Admission skin assessment done.No contraband found on admission. Patient oriented to unit/staff/room. Patient denies any questions/concerns at this time.

## 2015-08-27 NOTE — Tx Team (Signed)
Initial Interdisciplinary Treatment Plan   PATIENT STRESSORS: Health problems Medication change or noncompliance Occupational concerns   PATIENT STRENGTHS: Capable of independent living Communication skills Supportive family/friends   PROBLEM LIST: Problem List/Patient Goals Date to be addressed Date deferred Reason deferred Estimated date of resolution  Bipolar 08/27/2015     Depression 08/27/2015                                                DISCHARGE CRITERIA:  Ability to meet basic life and health needs Adequate post-discharge living arrangements Reduction of life-threatening or endangering symptoms to within safe limits  PRELIMINARY DISCHARGE PLAN: Attend PHP/IOP Return to previous living arrangement  PATIENT/FAMIILY INVOLVEMENT: This treatment plan has been presented to and reviewed with the patient, Christian [name], and/or family member,  The patient and family have been given the opportunity to ask questions and make suggestions.  Margo Common Chavis Tessler 08/27/2015, 6:16 PM

## 2015-08-27 NOTE — H&P (Signed)
Psychiatric Admission Assessment Adult  Patient Identification: Christian [name] MRN:  [id number] Date of Evaluation:  08/27/2015 Chief Complaint:  Bipolar Principal Diagnosis: <principal problem not specified> Diagnosis:   Patient Active Problem List   Diagnosis Date Noted  . Pressure ulcer [L89.90] 07/30/2015  . Protein-calorie malnutrition, severe [E43] 07/30/2015  . Dehydration [E86.0] 07/29/2015  . Bipolar disorder, current episode depressed, severe, with psychotic features [F31.5] 07/09/2015  . Diabetes [E11.9] 07/09/2015  . UTI (urinary tract infection) [N39.0] 06/19/2014  . Positive blood culture [R78.81] 06/19/2014  . Bipolar affective disorder, current episode manic with psychotic symptoms [F31.2] 06/17/2014  . Type II or unspecified type diabetes mellitus with unspecified complication, uncontrolled [E11.8] 06/02/2014  . Other and unspecified hyperlipidemia [E78.5] 06/02/2014  . OSA on CPAP [G47.33] 06/02/2014  . 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 Gout [M10.9] 06/02/2014  . Hypertension [I10]    History of Present Illness:: Patient with bipolar disorder severe currently with a severe major depression with psychotic features. He has been receiving ECT treatment. Patient was on the medical service primarily for the sake of maintaining a PICC line so that we could do ECT. He has been transferred back to the psychiatry service today for more appropriate treatment of depression. We are hoping to be able to continue ECT. Also receiving antidepressives and anti-psychotic treatment. Associated Signs/Symptoms: Depression Symptoms:  depressed mood, anhedonia, insomnia, hypersomnia, psychomotor retardation, feelings of worthlessness/guilt, difficulty concentrating, hopelessness, recurrent thoughts of death, suicidal thoughts without plan, suicidal attempt, (Hypo) Manic Symptoms:  Currently not having any signs of  mania Anxiety Symptoms:  Anxiety not a major component Psychotic Symptoms:  Delusions, Hallucinations: Auditory Paranoia, PTSD Symptoms: Negative Total Time spent with patient: 45 minutes  Past Psychiatric History: Patient has a long history of bipolar disorder. Has experienced both manic and depressed symptoms in the past. Multiple prior suicide attempts. Lots of trouble staying stable with his disease seeming to progress in severity as he gets older  Risk to Self: Is patient at risk for suicide?: No Risk to Others:   Prior Inpatient Therapy:   Prior Outpatient Therapy:    Alcohol Screening: 1. How often do you have a drink containing alcohol?: Never 2. How many drinks containing alcohol do you have on a typical day when you are drinking?: 1 or 2 3. How often do you have six or more drinks on one occasion?: Never Preliminary Score: 0 4. How often during the last year have you found that you were not able to stop drinking once you had started?: Never 5. How often during the last year have you failed to do what was normally expected from you becasue of drinking?: Never 6. How often during the last year have you needed a first drink in the morning to get yourself going after a heavy drinking session?: Never 7. How often during the last year have you had a feeling of guilt of remorse after drinking?: Never 8. How often during the last year have you been unable to remember what happened the night before because you had been drinking?: Never 9. Have you or someone else been injured as a result of your drinking?: No 10. Has a relative or friend or a doctor or another health worker been concerned about your drinking or suggested you cut down?: No Alcohol Use Disorder Identification Test Final Score (AUDIT): 0 Brief Intervention: AUDIT score less than 7 or less-screening does not suggest unhealthy drinking-brief intervention not indicated Substance  Abuse History in the last 12 months:   No. Consequences of Substance Abuse: Negative Previous Psychotropic Medications: Yes  Psychological Evaluations: No  Past Medical History:  Past Medical History  Diagnosis Date  . Mood swings   . Type II or unspecified type diabetes mellitus with unspecified complication, uncontrolled 06/02/2014  . Other and unspecified hyperlipidemia 06/02/2014  . OSA on CPAP 06/02/2014  . 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 Bipolar disorder   . CHF (congestive heart failure)   . CKD (chronic kidney disease), stage III   . Diabetes mellitus without complication   . Hypertension   . Gout   . Bipolar 1 disorder   . OSA on CPAP    History reviewed. No pertinent past surgical history. Family History:  Family History  Problem Relation Age of Onset  . Dementia Mother   . Arthritis Father   . Heart failure Neg Hx   . Kidney failure Neg Hx   . Cancer Neg Hx    Family Psychiatric  History: Positive for mood disorder Social History:  History  Alcohol Use No     History  Drug Use No    Social History   Social History  . Marital Status: Married    Spouse Name: N/A  . Number of Children: N/A  . Years of Education: N/A   Social History Main Topics  . Smoking status: Never Smoker   . Smokeless tobacco: None  . Alcohol Use: No  . Drug Use: No  . Sexual Activity: Not Currently   Other Topics Concern  . None   Social History Narrative   ** Merged History Encounter **       Additional Social History:                         Allergies:  No Known Allergies Lab Results:  Results for orders placed or performed during the hospital encounter of 07/29/15 (from the past 48 hour(s))  Glucose, capillary     Status: Abnormal   Collection Time: 08/25/15 10:22 PM  Result Value Ref Range   Glucose-Capillary 253 (H) 65 - 99 mg/dL  Creatinine, serum     Status: None   Collection Time: 08/26/15  5:16 AM  Result Value Ref Range   Creatinine, Ser  0.91 0.61 - 1.24 mg/dL   GFR calc non Af Amer >60 >60 mL/min   GFR calc Af Amer >60 >60 mL/min    Comment: (NOTE) The eGFR has been calculated using the CKD EPI equation. This calculation has not been validated in all clinical situations. eGFR's persistently <60 mL/min signify possible Chronic Kidney Disease.   Glucose, capillary     Status: Abnormal   Collection Time: 08/26/15  7:14 AM  Result Value Ref Range   Glucose-Capillary 148 (H) 65 - 99 mg/dL  Glucose, capillary     Status: Abnormal   Collection Time: 08/26/15 12:00 PM  Result Value Ref Range   Glucose-Capillary 196 (H) 65 - 99 mg/dL  Glucose, capillary     Status: Abnormal   Collection Time: 08/26/15  5:00 PM  Result Value Ref Range   Glucose-Capillary 191 (H) 65 - 99 mg/dL  Glucose, capillary     Status: Abnormal   Collection Time: 08/26/15  9:08 PM  Result Value Ref Range   Glucose-Capillary 251 (H) 65 - 99 mg/dL  Glucose, capillary     Status: Abnormal   Collection Time: 08/27/15  8:12 AM  Result Value Ref Range   Glucose-Capillary 134 (H) 65 - 99 mg/dL  Glucose, capillary     Status: Abnormal   Collection Time: 08/27/15  1:56 PM  Result Value Ref Range   Glucose-Capillary 134 (H) 65 - 99 mg/dL    Metabolic Disorder Labs:  Lab Results  Component Value Date   HGBA1C 7.5* 07/11/2015   MPG 186* 07/02/2014   MPG 240* 05/25/2014   No results found for: PROLACTIN Lab Results  Component Value Date   CHOL  03/08/2011    99        ATP III CLASSIFICATION:  <200     mg/dL   Desirable  200-239  mg/dL   Borderline High  >=240    mg/dL   High          TRIG 62 03/08/2011   HDL 34* 03/08/2011   CHOLHDL 2.9 03/08/2011   VLDL 12 03/08/2011   LDLCALC  03/08/2011    53        Total Cholesterol/HDL:CHD Risk Coronary Heart Disease Risk Table                     Men   Women  1/2 Average Risk   3.4   3.3  Average Risk       5.0   4.4  2 X Average Risk   9.6   7.1  3 X Average Risk  23.4   11.0        Use the  calculated Patient Ratio above and the CHD Risk Table to determine the patient's CHD Risk.        ATP III CLASSIFICATION (LDL):  <100     mg/dL   Optimal  100-129  mg/dL   Near or Above                    Optimal  130-159  mg/dL   Borderline  160-189  mg/dL   High  >190     mg/dL   Very High   LDLCALC  03/07/2011    32        Total Cholesterol/HDL:CHD Risk Coronary Heart Disease Risk Table                     Men   Women  1/2 Average Risk   3.4   3.3  Average Risk       5.0   4.4  2 X Average Risk   9.6   7.1  3 X Average Risk  23.4   11.0        Use the calculated Patient Ratio above and the CHD Risk Table to determine the patient's CHD Risk.        ATP III CLASSIFICATION (LDL):  <100     mg/dL   Optimal  100-129  mg/dL   Near or Above                    Optimal  130-159  mg/dL   Borderline  160-189  mg/dL   High  >190     mg/dL   Very High    Current Medications: No current facility-administered medications for this encounter.   PTA Medications: Prescriptions prior to admission  Medication Sig Dispense Refill Last Dose  . allopurinol (ZYLOPRIM) 300 MG tablet Take 1 tablet (300 mg total) by mouth daily. For gout   Past Month at Unknown time  . amLODipine (NORVASC)  10 MG tablet Take 10 mg by mouth daily.   Past Month at Unknown time  . aspirin EC 81 MG tablet Take 81 mg by mouth daily.   Past Month at Unknown time  . atorvastatin (LIPITOR) 10 MG tablet Take 1 tablet (10 mg total) by mouth daily. For high cholesterol   Past Month at Unknown time  . cholecalciferol (VITAMIN D) 400 UNITS TABS tablet Take 800 Units by mouth daily.   Past Month at Unknown time  . clonazePAM (KLONOPIN) 1 MG tablet Take 1 tablet (1 mg total) by mouth at bedtime. 30 tablet 0   . colchicine 0.6 MG tablet Take 1 tablet (0.6 mg total) by mouth 2 (two) times daily. For Gout pain (Patient taking differently: Take 0.6 mg by mouth daily. For Gout pain)   Past Month at Unknown time  . diltiazem  (CARDIZEM CD) 120 MG 24 hr capsule Take 1 capsule (120 mg total) by mouth daily. 30 capsule 0 08/27/2015 at 827  . docusate sodium (COLACE) 100 MG capsule Take 2 capsules (200 mg total) by mouth 2 (two) times daily. 10 capsule 0   . feeding supplement, GLUCERNA SHAKE, (GLUCERNA SHAKE) LIQD Take 237 mLs by mouth 3 (three) times daily with meals.  0   . finasteride (PROSCAR) 5 MG tablet Take 1 tablet (5 mg total) by mouth daily. 1 tablet 0   . furosemide (LASIX) 20 MG tablet Take 20 mg by mouth daily.   Past Month at Unknown time  . hydrALAZINE (APRESOLINE) 25 MG tablet Take 1 tablet (25 mg total) by mouth every 6 (six) hours.   08/25/2015 at 0831  . insulin aspart (NOVOLOG) 100 UNIT/ML injection Inject 0-9 Units into the skin 3 (three) times daily with meals. 10 mL 11   . insulin glargine (LANTUS) 100 UNIT/ML injection Inject 0.12 mLs (12 Units total) into the skin at bedtime. 10 mL 11   . lisinopril (PRINIVIL,ZESTRIL) 10 MG tablet Take 1 tablet (10 mg total) by mouth daily. 1 tablet 0   . megestrol (MEGACE) 400 MG/10ML suspension Take 10 mLs (400 mg total) by mouth daily. 240 mL 0   . metoprolol (TOPROL-XL) 200 MG 24 hr tablet Take 1 tablet (200 mg total) by mouth daily. For high blood pressure (Patient taking differently: Take 100 mg by mouth 2 (two) times daily. For high blood pressure)   08/27/2015 at 0827  . mirtazapine (REMERON) 30 MG tablet Take 30 mg by mouth at bedtime.   07/24/2015 at Unknown time  . OLANZapine zydis (ZYPREXA) 15 MG disintegrating tablet Take 2 tablets (30 mg total) by mouth at bedtime. 1 tablet 0   . ondansetron (ZOFRAN) 4 MG tablet Take 1 tablet (4 mg total) by mouth every 6 (six) hours as needed for nausea. 20 tablet 0   . polyethylene glycol (MIRALAX / GLYCOLAX) packet Take 17 g by mouth daily. 14 each 0   . tamsulosin (FLOMAX) 0.4 MG CAPS capsule Take 1 capsule (0.4 mg total) by mouth at bedtime. 30 capsule 9   . traZODone (DESYREL) 50 MG tablet Take 0.5 tablets (25 mg  total) by mouth 3 (three) times daily. (Patient taking differently: Take 200 mg by mouth at bedtime. )       Musculoskeletal: Strength & Muscle Tone: decreased Gait & Station: normal Patient leans: N/A  Psychiatric Specialty Exam: Physical Exam  Nursing note and vitals reviewed. Constitutional: He appears well-developed and well-nourished.  HENT:  Head: Normocephalic and atraumatic.  Eyes: Conjunctivae  are normal. Pupils are equal, round, and reactive to light.  Neck: Normal range of motion.  Cardiovascular: Normal heart sounds.   Respiratory: Effort normal.  GI: Soft.  Musculoskeletal: Normal range of motion.       Arms: Neurological: He is alert.  Skin: Skin is warm and dry.  Psychiatric: His affect is blunt. His speech is delayed. He is slowed. Thought content is delusional. Cognition and memory are impaired. He expresses inappropriate judgment. He exhibits a depressed mood. He expresses no suicidal ideation. He exhibits abnormal recent memory.    Review of Systems  Constitutional: Negative.   HENT: Negative.   Eyes: Negative.   Respiratory: Negative.   Cardiovascular: Negative.   Gastrointestinal: Negative.   Musculoskeletal: Positive for joint pain.  Skin: Negative.   Neurological: Negative.   Psychiatric/Behavioral: Positive for depression and memory loss. Negative for suicidal ideas, hallucinations and substance abuse. The patient is nervous/anxious. The patient does not have insomnia.     Blood pressure 114/75, pulse 90, temperature 97.6 F (36.4 C), temperature source Oral, resp. rate 18, height 6' (1.829 m), weight 97.07 kg (214 lb), SpO2 99 %.Body mass index is 29.02 kg/(m^2).  General Appearance: Casual  Eye Contact::  Fair  Speech:  Slow  Volume:  Decreased  Mood:  Dysphoric  Affect:  Depressed  Thought Process:  Goal Directed  Orientation:  Full (Time, Place, and Person)  Thought Content:  Delusions  Suicidal Thoughts:  Yes.  without intent/plan    Homicidal Thoughts:  No  Memory:  Immediate;   Fair Recent;   Fair Remote;   Fair  Judgement:  Impaired  Insight:  Shallow  Psychomotor Activity:  Decreased  Concentration:  Fair  Recall:  [place]: Fair  Akathisia:  No  Handed:  Right  AIMS (if indicated):     Assets:  Desire for Improvement Financial Resources/Insurance Housing Intimacy Resilience Social Support  ADL's:  Intact  Cognition: Impaired,  Mild  Sleep:        Treatment Plan Summary: Daily contact with patient to assess and evaluate symptoms and progress in treatment, Medication management and Plan Plan is to continue current combination of psychiatric medicine along with ECT. Patient is clearly benefiting and is much improved from how he was a few weeks ago but the progress has been slow. I have had him transferred back to the behavioral health unit because I think that that environment would encourage his recovery a little better. Hopefully we will be able to perform ECT and get venous access. Patient is agreeable to the plan and tolerating medicine. Reviewed the plan with him. Also reviewed the plan with his wife. We will be hoping that we might be able to work towards discharge home although placement is still an option. And is currently still too sick and requires too much care for him to live in his home environment.  Observation Level/Precautions:  15 minute checks  Laboratory:  HbAIC  Psychotherapy:    Medications:    Consultations:    Discharge Concerns:    Estimated LOS:  Other:     I certify that inpatient services furnished can reasonably be expected to improve the patient's condition.   [name] 9/28/20167:51 PM

## 2015-08-27 NOTE — BHH Counselor (Addendum)
Pt. is to be admitted to [facility] by Dr. Toni Amend. Attending Physician will be Dr. Toni Amend.  Pt. has been assigned to room 313, by [facility] Charge Nurse Gwen. Pt RN (Diedra) and Pt access have been made aware of admission.

## 2015-08-28 DIAGNOSIS — F3131 Bipolar disorder, current episode depressed, mild: Secondary | ICD-10-CM | POA: Diagnosis present

## 2015-08-28 DIAGNOSIS — F3130 Bipolar disorder, current episode depressed, mild or moderate severity, unspecified: Secondary | ICD-10-CM | POA: Diagnosis present

## 2015-08-28 LAB — GLUCOSE, CAPILLARY: Glucose-Capillary: 305 mg/dL — ABNORMAL HIGH (ref 65–99)

## 2015-08-28 MED ORDER — LISINOPRIL 10 MG PO TABS
10.0000 mg | ORAL_TABLET | ORAL | Status: AC
  Administered 2015-08-28: 10 mg via ORAL
  Filled 2015-08-28: qty 1

## 2015-08-28 MED ORDER — LISINOPRIL 10 MG PO TABS
10.0000 mg | ORAL_TABLET | Freq: Every day | ORAL | Status: DC
  Administered 2015-08-28 – 2015-08-31 (×4): 10 mg via ORAL
  Filled 2015-08-28 (×4): qty 1

## 2015-08-28 MED ORDER — FINASTERIDE 5 MG PO TABS
5.0000 mg | ORAL_TABLET | Freq: Every day | ORAL | Status: DC
  Administered 2015-08-28 – 2015-08-31 (×3): 5 mg via ORAL
  Filled 2015-08-28 (×5): qty 1

## 2015-08-28 MED ORDER — AMLODIPINE BESYLATE 10 MG PO TABS
10.0000 mg | ORAL_TABLET | Freq: Every day | ORAL | Status: DC
  Administered 2015-08-28 – 2015-08-31 (×4): 10 mg via ORAL
  Filled 2015-08-28 (×4): qty 1

## 2015-08-28 MED ORDER — ASPIRIN EC 81 MG PO TBEC
81.0000 mg | DELAYED_RELEASE_TABLET | Freq: Every day | ORAL | Status: DC
Start: 2015-08-28 — End: 2015-08-31
  Administered 2015-08-28 – 2015-08-31 (×3): 81 mg via ORAL
  Filled 2015-08-28 (×3): qty 1

## 2015-08-28 MED ORDER — MAGNESIUM HYDROXIDE 400 MG/5ML PO SUSP: 30.0000 mL | Freq: Every day | ORAL | Status: DC | PRN

## 2015-08-28 MED ORDER — TAMSULOSIN HCL 0.4 MG PO CAPS
0.4000 mg | ORAL_CAPSULE | Freq: Every day | ORAL | Status: DC
  Administered 2015-08-29 – 2015-08-30 (×2): 0.4 mg via ORAL
  Filled 2015-08-28 (×2): qty 1

## 2015-08-28 MED ORDER — FUROSEMIDE 20 MG PO TABS
20.0000 mg | ORAL_TABLET | Freq: Every day | ORAL | Status: DC
  Administered 2015-08-28 – 2015-08-31 (×3): 20 mg via ORAL
  Filled 2015-08-28 (×5): qty 1

## 2015-08-28 MED ORDER — ATORVASTATIN CALCIUM 20 MG PO TABS
10.0000 mg | ORAL_TABLET | Freq: Every day | ORAL | Status: DC
  Administered 2015-08-29 – 2015-08-30 (×2): 10 mg via ORAL
  Filled 2015-08-28: qty 1
  Filled 2015-08-28: qty 2

## 2015-08-28 MED ORDER — DILTIAZEM HCL 60 MG PO TABS: 120.0000 mg | ORAL_TABLET | ORAL | Status: DC

## 2015-08-28 MED ORDER — HYDRALAZINE HCL 25 MG PO TABS
25.0000 mg | ORAL_TABLET | Freq: Four times a day (QID) | ORAL | Status: DC
  Administered 2015-08-28 – 2015-08-31 (×10): 25 mg via ORAL
  Filled 2015-08-28 (×10): qty 1

## 2015-08-28 MED ORDER — DILTIAZEM HCL ER COATED BEADS 120 MG PO CP24
120.0000 mg | ORAL_CAPSULE | Freq: Every day | ORAL | Status: DC
  Administered 2015-08-29 – 2015-08-31 (×3): 120 mg via ORAL
  Filled 2015-08-28 (×3): qty 1

## 2015-08-28 MED ORDER — DILTIAZEM HCL ER COATED BEADS 120 MG PO CP24
120.0000 mg | ORAL_CAPSULE | ORAL | Status: AC
  Administered 2015-08-28: 120 mg via ORAL
  Filled 2015-08-28: qty 1

## 2015-08-28 MED ORDER — CHOLECALCIFEROL 10 MCG (400 UNIT) PO TABS
800.0000 [IU] | ORAL_TABLET | Freq: Every day | ORAL | Status: DC
  Administered 2015-08-28 – 2015-08-31 (×3): 800 [IU] via ORAL
  Filled 2015-08-28 (×5): qty 2

## 2015-08-28 MED ORDER — ALLOPURINOL 300 MG PO TABS
300.0000 mg | ORAL_TABLET | Freq: Every day | ORAL | Status: DC
  Administered 2015-08-28 – 2015-08-31 (×3): 300 mg via ORAL
  Filled 2015-08-28 (×5): qty 1

## 2015-08-28 MED ORDER — MIRTAZAPINE 30 MG PO TABS
45.0000 mg | ORAL_TABLET | Freq: Every day | ORAL | Status: DC
  Administered 2015-08-28 – 2015-08-30 (×3): 45 mg via ORAL
  Filled 2015-08-28 (×3): qty 1

## 2015-08-28 MED ORDER — ALUM & MAG HYDROXIDE-SIMETH 200-200-20 MG/5ML PO SUSP: 30.0000 mL | ORAL | Status: DC | PRN

## 2015-08-28 MED ORDER — ACETAMINOPHEN 325 MG PO TABS
650.0000 mg | ORAL_TABLET | Freq: Four times a day (QID) | ORAL | Status: DC | PRN
  Administered 2015-08-31: 650 mg via ORAL
  Filled 2015-08-28: qty 2

## 2015-08-28 MED ORDER — INSULIN GLARGINE 100 UNIT/ML ~~LOC~~ SOLN
12.0000 [IU] | Freq: Every day | SUBCUTANEOUS | Status: DC
  Administered 2015-08-28 – 2015-08-29 (×2): 12 [IU] via SUBCUTANEOUS
  Filled 2015-08-28 (×5): qty 0.12

## 2015-08-28 MED ORDER — MEGESTROL ACETATE 40 MG PO TABS
40.0000 mg | ORAL_TABLET | Freq: Every day | ORAL | Status: DC
  Administered 2015-08-28 – 2015-08-31 (×3): 40 mg via ORAL
  Filled 2015-08-28 (×6): qty 1

## 2015-08-28 MED ORDER — OLANZAPINE 5 MG PO TBDP
30.0000 mg | ORAL_TABLET | Freq: Every day | ORAL | Status: DC
  Administered 2015-08-28 – 2015-08-30 (×3): 30 mg via ORAL
  Filled 2015-08-28 (×4): qty 6

## 2015-08-28 NOTE — Progress Notes (Signed)
Calm and cooperative. Flat affect. Limited interactions with peers. Did not attend group. Pt remain in bed throughout the shift. amb via w/c. Med compliant. denies SI/HI/AV/H. No c/o pain/discomfort noted. Slept 2.3 hours.

## 2015-08-28 NOTE — Progress Notes (Signed)
He rated his depression 5/10.Denies suicidal ideation.Minimal interaction with peers.He is pleasant on approach.Attended groups.He ambulated without assistance.Appetite good.

## 2015-08-28 NOTE — Plan of Care (Signed)
Problem: Alteration in mood Goal: LTG-Pt's behavior demonstrates decreased signs of depression (Patient's behavior demonstrates decreased signs of depression to the point the patient is safe to return home and continue treatment in an outpatient setting)  Outcome: Progressing Verbalized his depression is 5/10.

## 2015-08-28 NOTE — [facility] Group Notes (Signed)
[facility] Group Notes:  (Nursing/MHT/Case Management/Adjunct)  Date:  08/28/2015  Time:  9:20 PM  Type of Therapy:  Group Therapy  Participation Level:  Active  Participation Quality:  Appropriate  Affect:  Appropriate  Cognitive:  Appropriate  Insight:  Appropriate  Engagement in Group:  Engaged  Modes of Intervention:  Discussion  Summary of Progress/Problems:  Christian [name] 08/28/2015, 9:20 PM

## 2015-08-28 NOTE — [facility] Group Notes (Signed)
[facility] Group Notes:  (Nursing/MHT/Case Management/Adjunct)  Date:  08/28/2015  Time:  1:32 PM  Type of Therapy:  Psychoeducational Skills  Participation Level:  Minimal  Participation Quality:  Appropriate  Affect:  Appropriate  Cognitive:  Appropriate  Insight:  Appropriate  Engagement in Group:  Developing/Improving  Modes of Intervention:  Discussion, Education and Support  Summary of Progress/Problems:  Darrow Bussing 08/28/2015, 1:32 PM

## 2015-08-28 NOTE — Progress Notes (Signed)
   08/28/15 1250  Clinical Encounter Type  Visited With Patient  Visit Type Follow-up  Referral From Nurse  Consult/Referral To Chaplain  Nurse told Chaplain that patient might be interested in information regarding a Living Will. Spoke to patient and he did not appear to know what I was talking about.  Patient did say he did not wish to talk now, but wanted to think about it a little more and said he will contact us if needed.  [employer] Laronica Bhagat-pager [phone]

## 2015-08-28 NOTE — Progress Notes (Signed)
Patient ID: TKAI KILLEY, male   DOB: September 04, 1951, 64 y.o.   MRN: [id number]  [facility] PROGRESS NOTE  PCP: Leanor Rubenstein, MD   Denies any new changes Objective: Filed Vitals:   08/28/15 0703  BP: 152/101  Pulse: 109  Temp:   Resp:     Filed Weights   08/27/15 1653  Weight: 97.07 kg (214 lb)    ROS: Review of Systems  Unable to perform ROS: other  Respiratory: Negative for cough and shortness of breath.   Cardiovascular: Negative for chest pain.  Musculoskeletal: Negative for joint pain.  Psychiatric/Behavioral: Positive for depression.   Exam: Physical Exam  HENT:  Nose: No mucosal edema.  Mouth/Throat: No oropharyngeal exudate or posterior oropharyngeal edema.  Eyes: Conjunctivae are normal. Pupils are equal, round, and reactive to light.  Eyelids seem swollen today.  Neck: No JVD present. Carotid bruit is not present. No edema present. No thyroid mass and no thyromegaly present.  Cardiovascular: S1 normal and S2 normal.  Exam reveals no gallop.   No murmur heard. Pulses:      Dorsalis pedis pulses are 2+ on the right side, and 2+ on the left side.  Respiratory: No respiratory distress. He has no wheezes. He has no rhonchi. He has no rales.  GI: Soft. Bowel sounds are normal. There is no tenderness.  Musculoskeletal:       Right ankle: He exhibits swelling.       Left ankle: He exhibits swelling.  Lymphadenopathy:    He has no cervical adenopathy.  Neurological: He is alert.  Skin: Skin is warm. No rash noted. Nails show no clubbing.  Psychiatric: He exhibits a depressed mood.     CBG:  Recent Labs Lab 08/26/15 1200 08/26/15 1700 08/26/15 2108 08/27/15 0812 08/27/15 1356  GLUCAP 196* 191* 251* 134* 134*    Scheduled Meds: . sodium chloride  250 mL Intravenous Once  . ketamine  120 mg Intravenous Once  . labetalol  20 mg Intravenous Once  . lidocaine (cardiac) 100 mg/35ml  4 mg Intravenous Once  . succinylcholine  120 mg  Intravenous Once   Continuous Infusions:    Assessment/Plan:  1. Failure to thrive, dehydration due to severe depression. ECT treatments Monday Wednesday Friday as per Dr. Toni Amend. Once ok per psych then d/c bhu .  2. Essential hypertension- currently stable on usual medications  3. Type 2 diabetes-increased Lantus  4. Hyperlipidemia unspecified on atorvastatin 5. History of gout on allopurinol 6. Overflow incontinence- continue Flomax 7. BPH- Flomax and finasteride, no complains 8. Constipation on MiraLAX and Colace  Code Status:     Code Status Orders        Start     Ordered   07/29/15 2027  Full code   Continuous     07/29/15 2028     Disposition Plan: To be determined  Consultants:  Psychiatry  Procedures:  ECT treatments  Time spent: 25 minutes  Addisen Chappelle, [facility]  [facility]

## 2015-08-28 NOTE — Plan of Care (Signed)
Problem: Ineffective individual coping Goal: STG-Increase in ability to manage activities of daily living Outcome: Not Met (add Reason) Calm and cooperative. Flat affect. Pt remain bed throughout the shift. amb via w/c with one assist. No injuries noted. q 15 min checks maintained for safety.

## 2015-08-28 NOTE — Anesthesia Postprocedure Evaluation (Signed)
  Anesthesia Post-op Note  Patient: Christian [name]  Procedure(s) Performed: * No procedures listed *  Anesthesia type:General  Patient location: PACU  Post pain: Pain level controlled  Post assessment: Post-op Vital signs reviewed, Patient's Cardiovascular Status Stable, Respiratory Function Stable, Patent Airway and No signs of Nausea or vomiting  Post vital signs: Reviewed and stable  Last Vitals:  Filed Vitals:   08/27/15 1348  BP: 132/77  Pulse: 85  Temp: 37.4 C  Resp: 18    Level of consciousness: awake, alert  and patient cooperative  Complications: No apparent anesthesia complications

## 2015-08-28 NOTE — Progress Notes (Signed)
Pt present with an elevated blood pressure 164/`03, HR 102 this am. Dr Toni Amend notified. N.O. lisinopirl 10mg  and cardizem CD 120 mg PO now. Skin warm and dry to touch. No s/s resp distress noted. Pt resting comfortable in bed.

## 2015-08-28 NOTE — Progress Notes (Signed)
[facility] MD Progress Note  08/28/2015 7:10 PM Christian [name]  MRN:  [id number] Subjective:  Follow-up for this 64 year old man with severe depression as part of bipolar disorder Principal Problem: Bipolar disorder, current episode depressed, severe, with psychotic features Diagnosis:   Patient Active Problem List   Diagnosis Date Noted  . Bipolar I disorder, severe, current or most recent episode depressed, with psychotic features [F31.5]   . Pressure ulcer [L89.90] 07/30/2015  . Protein-calorie malnutrition, severe [E43] 07/30/2015  . Dehydration [E86.0] 07/29/2015  . Bipolar disorder, current episode depressed, severe, with psychotic features [F31.5] 07/09/2015  . Diabetes [E11.9] 07/09/2015  . UTI (urinary tract infection) [N39.0] 06/19/2014  . Positive blood culture [R78.81] 06/19/2014  . Bipolar affective disorder, current episode manic with psychotic symptoms [F31.2] 06/17/2014  . Type II or unspecified type diabetes mellitus with unspecified complication, uncontrolled [E11.8] 06/02/2014  . Other and unspecified hyperlipidemia [E78.5] 06/02/2014  . OSA on CPAP [G47.33] 06/02/2014  . 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 Gout [M10.9] 06/02/2014  . Hypertension [I10]    Total Time spent with patient: 30 minutes  Past Psychiatric History: Patient has bipolar disorder. Has severe depression. Has been psychotic. Receiving ECT and medication management. Also being treated for his diabetes and high blood pressure and a pressure ulcer  Past Medical History:  Past Medical History  Diagnosis Date  . Mood swings   . Type II or unspecified type diabetes mellitus with unspecified complication, uncontrolled 06/02/2014  . Other and unspecified hyperlipidemia 06/02/2014  . OSA on CPAP 06/02/2014  . 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 Bipolar disorder   . CHF (congestive heart failure)   . CKD (chronic  kidney disease), stage III   . Diabetes mellitus without complication   . Hypertension   . Gout   . Bipolar 1 disorder   . OSA on CPAP    History reviewed. No pertinent past surgical history. Family History:  Family History  Problem Relation Age of Onset  . Dementia Mother   . Arthritis Father   . Heart failure Neg Hx   . Kidney failure Neg Hx   . Cancer Neg Hx    Family Psychiatric  History: Family history of mood symptoms Social History:  History  Alcohol Use No     History  Drug Use No    Social History   Social History  . Marital Status: Married    Spouse Name: N/A  . Number of Children: N/A  . Years of Education: N/A   Social History Main Topics  . Smoking status: Never Smoker   . Smokeless tobacco: None  . Alcohol Use: No  . Drug Use: No  . Sexual Activity: Not Currently   Other Topics Concern  . None   Social History Narrative   ** Merged History Encounter **       Additional Social History:                         Sleep: Fair  Appetite:  Good  Current Medications: Current Facility-Administered Medications  Medication Dose Route Frequency Provider Last Rate Last Dose  . 0.9 %  sodium chloride infusion  250 mL Intravenous Once Audery Amel, MD   250 mL at 08/27/15 2030  . ketamine (KETALAR) injection 120 mg  120 mg Intravenous Once Audery Amel, MD   120 mg at 08/27/15 2030  .  labetalol (NORMODYNE,TRANDATE) injection 20 mg  20 mg Intravenous Once Audery Amel, MD   20 mg at 08/27/15 2030  . lidocaine (cardiac) 100 mg/54ml (XYLOCAINE) 20 MG/ML injection 2% 4 mg  4 mg Intravenous Once Audery Amel, MD   4 mg at 08/27/15 2030  . succinylcholine (ANECTINE) injection 120 mg  120 mg Intravenous Once Audery Amel, MD   120 mg at 08/27/15 2030    Lab Results:  Results for orders placed or performed during the hospital encounter of 07/29/15 (from the past 48 hour(s))  Glucose, capillary     Status: Abnormal   Collection Time: 08/26/15   9:08 PM  Result Value Ref Range   Glucose-Capillary 251 (H) 65 - 99 mg/dL  Glucose, capillary     Status: Abnormal   Collection Time: 08/27/15  8:12 AM  Result Value Ref Range   Glucose-Capillary 134 (H) 65 - 99 mg/dL  Glucose, capillary     Status: Abnormal   Collection Time: 08/27/15  1:56 PM  Result Value Ref Range   Glucose-Capillary 134 (H) 65 - 99 mg/dL    Physical Findings: AIMS:  , ,  ,  ,    CIWA:    COWS:     Musculoskeletal: Strength & Muscle Tone: within normal limits Gait & Station: normal Patient leans: N/A  Psychiatric Specialty Exam: Review of Systems  Constitutional: Negative.   HENT: Negative.   Eyes: Negative.   Respiratory: Negative.   Cardiovascular: Negative.   Gastrointestinal: Negative.   Musculoskeletal: Negative.   Skin: Negative.   Neurological: Negative.   Psychiatric/Behavioral: Positive for depression and memory loss. Negative for suicidal ideas, hallucinations and substance abuse. The patient is not nervous/anxious and does not have insomnia.     Blood pressure 129/86, pulse 109, temperature 98.5 F (36.9 C), temperature source Oral, resp. rate 18, height 6' (1.829 m), weight 97.07 kg (214 lb), SpO2 99 %.Body mass index is 29.02 kg/(m^2).  General Appearance: Casual  Eye Contact::  Fair  Speech:  Slow  Volume:  Decreased  Mood:  Euthymic  Affect:  Flat  Thought Process:  Linear  Orientation:  Full (Time, Place, and Person)  Thought Content:  Negative  Suicidal Thoughts:  No  Homicidal Thoughts:  No  Memory:  Immediate;   Fair Recent;   Fair Remote;   Fair  Judgement:  Fair  Insight:  Fair  Psychomotor Activity:  Decreased  Concentration:  Fair  Recall:  [employer] of Knowledge:Fair  Language: Fair  Akathisia:  No  Handed:  Right  AIMS (if indicated):     Assets:  Desire for Improvement Financial Resources/Insurance Housing Intimacy Social Support  ADL's:  Intact  Cognition: Impaired,  Mild  Sleep:  Number of Hours:  2.3   Treatment Plan Summary: Daily contact with patient to assess and evaluate symptoms and progress in treatment, Medication management and Plan Patient is continuing to show slow benefit from ECT. He was transferred back to the psychiatry ward for a better environment conducive to continued psychiatric rehabilitation. I am pleased to see that he is getting dressed and getting up and walking around the unit. He is saying that his mood is feeling better. Based on my previous conversation with his wife I know he tends to minimize his symptoms. Plan for now is to continue medication including antidepressives and anti-psychotic and continue ECT. Daily evaluation of mood and behavior. Restart medicine for blood pressure and diabetes. Continue working with patient and family  on discharge planning.  [name] 08/28/2015, 7:10 PM

## 2015-08-29 ENCOUNTER — Other Ambulatory Visit: Payer: Self-pay

## 2015-08-29 ENCOUNTER — Encounter: Payer: Self-pay | Admitting: *Deleted

## 2015-08-29 ENCOUNTER — Inpatient Hospital Stay: Payer: Commercial Managed Care - HMO | Admitting: Anesthesiology

## 2015-08-29 ENCOUNTER — Inpatient Hospital Stay: Payer: Commercial Managed Care - HMO

## 2015-08-29 DIAGNOSIS — I10 Essential (primary) hypertension: Secondary | ICD-10-CM | POA: Diagnosis not present

## 2015-08-29 DIAGNOSIS — F315 Bipolar disorder, current episode depressed, severe, with psychotic features: Secondary | ICD-10-CM | POA: Diagnosis not present

## 2015-08-29 LAB — GLUCOSE, CAPILLARY
Glucose-Capillary: 204 mg/dL — ABNORMAL HIGH (ref 65–99)
Glucose-Capillary: 153 mg/dL — ABNORMAL HIGH (ref 65–99)

## 2015-08-29 MED ORDER — LIDOCAINE HCL (CARDIAC) 20 MG/ML IV SOLN
4.0000 mg | Freq: Once | INTRAVENOUS | Status: AC
  Administered 2015-08-29: 4 mg via INTRAVENOUS

## 2015-08-29 MED ORDER — LABETALOL HCL 5 MG/ML IV SOLN
20.0000 mg | Freq: Once | INTRAVENOUS | Status: AC
  Administered 2015-08-29: 20 mg via INTRAVENOUS

## 2015-08-29 MED ORDER — NITROGLYCERIN 0.2 MG/ML ON CALL CATH LAB
100.0000 ug | Freq: Once | INTRAVENOUS | Status: AC
  Administered 2015-08-29: 100 ug via INTRAVENOUS

## 2015-08-29 MED ORDER — KETAMINE HCL 10 MG/ML IJ SOLN
120.0000 mg | Freq: Once | INTRAMUSCULAR | Status: AC
  Administered 2015-08-29: 120 mg via INTRAVENOUS

## 2015-08-29 MED ORDER — LABETALOL HCL 5 MG/ML IV SOLN
10.0000 mg | INTRAVENOUS | Status: DC | PRN
  Administered 2015-08-29 (×2): 10 mg via INTRAVENOUS

## 2015-08-29 MED ORDER — SODIUM CHLORIDE 0.9 % IV SOLN: 250.0000 mL | Freq: Once | INTRAVENOUS | Status: DC

## 2015-08-29 MED ORDER — SUCCINYLCHOLINE CHLORIDE 20 MG/ML IJ SOLN
120.0000 mg | Freq: Once | INTRAMUSCULAR | Status: AC
  Administered 2015-08-29: 120 mg via INTRAVENOUS

## 2015-08-29 NOTE — Progress Notes (Signed)
[facility] MD Progress Note  08/29/2015 6:58 PM Christian [name]  MRN:  [id number] Subjective:  Follow-up for this 64 year old man with severe depression as part of bipolar disorder Principal Problem: Bipolar disorder, current episode depressed, severe, with psychotic features Diagnosis:   Patient Active Problem List   Diagnosis Date Noted  . Bipolar disorder, now depressed [F31.30] 08/28/2015  . Bipolar I disorder, severe, current or most recent episode depressed, with psychotic features [F31.5]   . Pressure ulcer [L89.90] 07/30/2015  . Protein-calorie malnutrition, severe [E43] 07/30/2015  . Dehydration [E86.0] 07/29/2015  . Bipolar disorder, current episode depressed, severe, with psychotic features [F31.5] 07/09/2015  . Diabetes [E11.9] 07/09/2015  . UTI (urinary tract infection) [N39.0] 06/19/2014  . Positive blood culture [R78.81] 06/19/2014  . Bipolar affective disorder, current episode manic with psychotic symptoms [F31.2] 06/17/2014  . Type II or unspecified type diabetes mellitus with unspecified complication, uncontrolled [E11.8] 06/02/2014  . Other and unspecified hyperlipidemia [E78.5] 06/02/2014  . OSA on CPAP [G47.33] 06/02/2014  . 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 Gout [M10.9] 06/02/2014  . Hypertension [I10]    Total Time spent with patient: 30 minutes  Past Psychiatric History: Patient has bipolar disorder. Has severe depression. Has been psychotic. Receiving ECT and medication management. Also being treated for his diabetes and high blood pressure and a pressure ulcer  Past Medical History:  Past Medical History  Diagnosis Date  . Mood swings   . Type II or unspecified type diabetes mellitus with unspecified complication, uncontrolled 06/02/2014  . Other and unspecified hyperlipidemia 06/02/2014  . OSA on CPAP 06/02/2014  . 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 Bipolar disorder    . CHF (congestive heart failure)   . CKD (chronic kidney disease), stage III   . Diabetes mellitus without complication   . Hypertension   . Gout   . Bipolar 1 disorder   . OSA on CPAP    History reviewed. No pertinent past surgical history. Family History:  Family History  Problem Relation Age of Onset  . Dementia Mother   . Arthritis Father   . Heart failure Neg Hx   . Kidney failure Neg Hx   . Cancer Neg Hx    Family Psychiatric  History: Family history of mood symptoms Social History:  History  Alcohol Use No     History  Drug Use No    Social History   Social History  . Marital Status: Married    Spouse Name: N/A  . Number of Children: N/A  . Years of Education: N/A   Social History Main Topics  . Smoking status: Never Smoker   . Smokeless tobacco: None  . Alcohol Use: No  . Drug Use: No  . Sexual Activity: Not Currently   Other Topics Concern  . None   Social History Narrative   ** Merged History Encounter **       Additional Social History:                         Sleep: Fair  Appetite:  Good  Current Medications: Current Facility-Administered Medications  Medication Dose Route Frequency Provider Last Rate Last Dose  . 0.9 %  sodium chloride infusion  250 mL Intravenous Once Audery Amel, MD      . acetaminophen (TYLENOL) tablet 650 mg  650 mg Oral Q6H PRN Audery Amel, MD      .  allopurinol (ZYLOPRIM) tablet 300 mg  300 mg Oral Daily Audery Amel, MD   300 mg at 08/28/15 2129  . alum & mag hydroxide-simeth (MAALOX/MYLANTA) 200-200-20 MG/5ML suspension 30 mL  30 mL Oral Q4H PRN Audery Amel, MD      . amLODipine (NORVASC) tablet 10 mg  10 mg Oral Daily Audery Amel, MD   10 mg at 08/29/15 0653  . aspirin EC tablet 81 mg  81 mg Oral Daily Audery Amel, MD   81 mg at 08/28/15 2130  . atorvastatin (LIPITOR) tablet 10 mg  10 mg Oral q1800 Audery Amel, MD   10 mg at 08/29/15 1706  . cholecalciferol (VITAMIN D) tablet 800  Units  800 Units Oral Daily Audery Amel, MD   800 Units at 08/28/15 2129  . diltiazem (CARDIZEM CD) 24 hr capsule 120 mg  120 mg Oral Daily Audery Amel, MD   120 mg at 08/29/15 0651  . finasteride (PROSCAR) tablet 5 mg  5 mg Oral Daily Audery Amel, MD   5 mg at 08/28/15 2129  . furosemide (LASIX) tablet 20 mg  20 mg Oral Daily Audery Amel, MD   20 mg at 08/28/15 2129  . hydrALAZINE (APRESOLINE) tablet 25 mg  25 mg Oral 4 times per day Audery Amel, MD   25 mg at 08/29/15 1706  . insulin glargine (LANTUS) injection 12 Units  12 Units Subcutaneous QHS Audery Amel, MD   12 Units at 08/28/15 2128  . ketamine (KETALAR) injection 120 mg  120 mg Intravenous Once Audery Amel, MD   120 mg at 08/27/15 2030  . labetalol (NORMODYNE,TRANDATE) injection 10 mg  10 mg Intravenous Q10 min PRN Amy Rice, MD   10 mg at 08/29/15 1317  . labetalol (NORMODYNE,TRANDATE) injection 20 mg  20 mg Intravenous Once Audery Amel, MD   20 mg at 08/27/15 2030  . lidocaine (cardiac) 100 mg/58ml (XYLOCAINE) 20 MG/ML injection 2% 4 mg  4 mg Intravenous Once Audery Amel, MD   4 mg at 08/27/15 2030  . lisinopril (PRINIVIL,ZESTRIL) tablet 10 mg  10 mg Oral Daily Audery Amel, MD   10 mg at 08/29/15 4098  . magnesium hydroxide (MILK OF MAGNESIA) suspension 30 mL  30 mL Oral Daily PRN Audery Amel, MD      . megestrol (MEGACE) tablet 40 mg  40 mg Oral Daily Audery Amel, MD   40 mg at 08/28/15 2129  . mirtazapine (REMERON) tablet 45 mg  45 mg Oral QHS Audery Amel, MD   45 mg at 08/28/15 2129  . OLANZapine zydis (ZYPREXA) disintegrating tablet 30 mg  30 mg Oral QHS Audery Amel, MD   30 mg at 08/28/15 2129  . succinylcholine (ANECTINE) injection 120 mg  120 mg Intravenous Once Audery Amel, MD   120 mg at 08/27/15 2030  . tamsulosin (FLOMAX) capsule 0.4 mg  0.4 mg Oral QPC supper Audery Amel, MD   0.4 mg at 08/29/15 1706    Lab Results:  Results for orders placed or performed during the hospital  encounter of 08/27/15 (from the past 48 hour(s))  Glucose, capillary     Status: Abnormal   Collection Time: 08/28/15  8:29 PM  Result Value Ref Range   Glucose-Capillary 305 (H) 65 - 99 mg/dL  Glucose, capillary     Status: Abnormal   Collection Time: 08/29/15  3:32 AM  Result Value Ref Range   Glucose-Capillary 204 (H) 65 - 99 mg/dL  Glucose, capillary     Status: Abnormal   Collection Time: 08/29/15  6:42 AM  Result Value Ref Range   Glucose-Capillary 153 (H) 65 - 99 mg/dL   Comment 1 Notify RN     Physical Findings: AIMS:  , ,  ,  ,    CIWA:    COWS:     Musculoskeletal: Strength & Muscle Tone: within normal limits Gait & Station: normal Patient leans: N/A  Psychiatric Specialty Exam: Review of Systems  Constitutional: Negative.   HENT: Negative.   Eyes: Negative.   Respiratory: Negative.   Cardiovascular: Negative.   Gastrointestinal: Negative.   Musculoskeletal: Negative.   Skin: Negative.   Neurological: Negative.   Psychiatric/Behavioral: Positive for depression and memory loss. Negative for suicidal ideas, hallucinations and substance abuse. The patient is not nervous/anxious and does not have insomnia.     Blood pressure 144/71, pulse 102, temperature 98.4 F (36.9 C), temperature source Oral, resp. rate 18, height 6' (1.829 m), weight 97.523 kg (215 lb), SpO2 96 %.Body mass index is 29.15 kg/(m^2).  General Appearance: Casual  Eye Contact::  Fair  Speech:  Slow  Volume:  Decreased  Mood:  Euthymic  Affect:  Flat  Thought Process:  Linear  Orientation:  Full (Time, Place, and Person)  Thought Content:  Negative  Suicidal Thoughts:  No  Homicidal Thoughts:  No  Memory:  Immediate;   Fair Recent;   Fair Remote;   Fair  Judgement:  Fair  Insight:  Fair  Psychomotor Activity:  Decreased  Concentration:  Fair  Recall:  [employer] of Knowledge:Fair  Language: Fair  Akathisia:  No  Handed:  Right  AIMS (if indicated):     Assets:  Desire for  Improvement Financial Resources/Insurance Housing Intimacy Social Support  ADL's:  Intact  Cognition: Impaired,  Mild  Sleep:  Number of Hours: 6.5   Treatment Plan Summary: Daily contact with patient to assess and evaluate symptoms and progress in treatment, Medication management and Plan Mr. material once again had bilateral ECT today. Tolerated well. Had a very good-looking seizure. This evening he reports that he is feeling better. Denies feeling depressed. Denies suicidal ideation. There had been concern earlier that he was getting paranoid although he denies it to my interview today. Affect is still blunted. He is more physically mobile. Plan to continue ECT into next week. Continue antidepressives and anti-psychotic medication. Wife has done some advance work on looking at rehabilitation. Blood sugars and vital signs looking good.  Christian [name] 08/29/2015, 6:58 PM

## 2015-08-29 NOTE — Plan of Care (Signed)
Problem: Alteration in mood Goal: LTG-Patient reports reduction in suicidal thoughts (Patient reports reduction in suicidal thoughts and is able to verbalize a safety plan for whenever patient is feeling suicidal)  Outcome: Progressing Denies suicidal thoughts      

## 2015-08-29 NOTE — [facility] Group Notes (Signed)
[facility] LCSW Group Therapy  08/29/2015 12:21 PM  Type of Therapy:  Group Therapy  Participation Level:  Did not attend.   Modes of Intervention:  Discussion, Education, Socialization and Support  Summary of Progress/Problems: Feelings around Relapse. Group members discussed the meaning of relapse and shared personal stories of relapse, how it affected them and others, and how they perceived themselves during this time. Group members were encouraged to identify triggers, warning signs and coping skills used when facing the possibility of relapse. Social supports were discussed and explored in detail.   Tallin Hart L Steve Gregg MSW, LCSWA  08/29/2015, 12:21 PM  

## 2015-08-29 NOTE — H&P (Signed)
Christian [name] is an 64 y.o. male.   Chief Complaint: Patient has no specific new complaint. 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 HPI: Patient has no interval change except for gradually increase strength as he becomes more mobile. Blood pressure is elevated today which is been a chronic issue  Past Medical History  Diagnosis Date  . Mood swings   . Type II or unspecified type diabetes mellitus with unspecified complication, uncontrolled 06/02/2014  . Other and unspecified hyperlipidemia 06/02/2014  . OSA on CPAP 06/02/2014  . 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 Bipolar disorder   . CHF (congestive heart failure)   . CKD (chronic kidney disease), stage III   . Diabetes mellitus without complication   . Hypertension   . Gout   . Bipolar 1 disorder   . OSA on CPAP     History reviewed. No pertinent past surgical history.  Family History  Problem Relation Age of Onset  . Dementia Mother   . Arthritis Father   . Heart failure Neg Hx   . Kidney failure Neg Hx   . Cancer Neg Hx    Social History:  reports that he has never smoked. He does not have any smokeless tobacco history on file. He reports that he does not drink alcohol or use illicit drugs.  Allergies: No Known Allergies  Medications Prior to Admission  Medication Sig Dispense Refill  . amLODipine (NORVASC) 10 MG tablet Take 10 mg by mouth daily.    Marland Kitchen diltiazem (CARDIZEM CD) 120 MG 24 hr capsule Take 1 capsule (120 mg total) by mouth daily. 30 capsule 0  . hydrALAZINE (APRESOLINE) 25 MG tablet Take 1 tablet (25 mg total) by mouth every 6 (six) hours.    Marland Kitchen lisinopril (PRINIVIL,ZESTRIL) 10 MG tablet Take 1 tablet (10 mg total) by mouth daily. 1 tablet 0  . allopurinol (ZYLOPRIM) 300 MG tablet Take 1 tablet (300 mg total) by mouth daily. For gout    . aspirin EC 81 MG tablet Take 81 mg by mouth daily.    Marland Kitchen  atorvastatin (LIPITOR) 10 MG tablet Take 1 tablet (10 mg total) by mouth daily. For high cholesterol    . cholecalciferol (VITAMIN D) 400 UNITS TABS tablet Take 800 Units by mouth daily.    . clonazePAM (KLONOPIN) 1 MG tablet Take 1 tablet (1 mg total) by mouth at bedtime. 30 tablet 0  . colchicine 0.6 MG tablet Take 1 tablet (0.6 mg total) by mouth 2 (two) times daily. For Gout pain (Patient taking differently: Take 0.6 mg by mouth daily. For Gout pain)    . docusate sodium (COLACE) 100 MG capsule Take 2 capsules (200 mg total) by mouth 2 (two) times daily. 10 capsule 0  . feeding supplement, GLUCERNA SHAKE, (GLUCERNA SHAKE) LIQD Take 237 mLs by mouth 3 (three) times daily with meals.  0  . finasteride (PROSCAR) 5 MG tablet Take 1 tablet (5 mg total) by mouth daily. 1 tablet 0  . furosemide (LASIX) 20 MG tablet Take 20 mg by mouth daily.    . insulin aspart (NOVOLOG) 100 UNIT/ML injection Inject 0-9 Units into the skin 3 (three) times daily with meals. 10 mL 11  . insulin glargine (LANTUS) 100 UNIT/ML injection Inject 0.12 mLs (12 Units total) into the skin at bedtime. 10 mL 11  . megestrol (MEGACE) 400 MG/10ML suspension Take 10 mLs (400 mg total) by mouth  daily. 240 mL 0  . metoprolol (TOPROL-XL) 200 MG 24 hr tablet Take 1 tablet (200 mg total) by mouth daily. For high blood pressure (Patient taking differently: Take 100 mg by mouth 2 (two) times daily. For high blood pressure)    . mirtazapine (REMERON) 30 MG tablet Take 30 mg by mouth at bedtime.    Marland Kitchen OLANZapine zydis (ZYPREXA) 15 MG disintegrating tablet Take 2 tablets (30 mg total) by mouth at bedtime. 1 tablet 0  . ondansetron (ZOFRAN) 4 MG tablet Take 1 tablet (4 mg total) by mouth every 6 (six) hours as needed for nausea. 20 tablet 0  . polyethylene glycol (MIRALAX / GLYCOLAX) packet Take 17 g by mouth daily. 14 each 0  . tamsulosin (FLOMAX) 0.4 MG CAPS capsule Take 1 capsule (0.4 mg total) by mouth at bedtime. 30 capsule 9  . traZODone  (DESYREL) 50 MG tablet Take 0.5 tablets (25 mg total) by mouth 3 (three) times daily. (Patient taking differently: Take 200 mg by mouth at bedtime. )      Results for orders placed or performed during the hospital encounter of 08/27/15 (from the past 48 hour(s))  Glucose, capillary     Status: Abnormal   Collection Time: 08/28/15  8:29 PM  Result Value Ref Range   Glucose-Capillary 305 (H) 65 - 99 mg/dL  Glucose, capillary     Status: Abnormal   Collection Time: 08/29/15  3:32 AM  Result Value Ref Range   Glucose-Capillary 204 (H) 65 - 99 mg/dL  Glucose, capillary     Status: Abnormal   Collection Time: 08/29/15  6:42 AM  Result Value Ref Range   Glucose-Capillary 153 (H) 65 - 99 mg/dL   Comment 1 Notify RN    No results found.  Review of Systems  Constitutional: Negative.   HENT: Negative.   Eyes: Negative.   Respiratory: Negative.   Cardiovascular: Negative.   Gastrointestinal: Negative.   Musculoskeletal: Negative.   Skin: Negative.   Neurological: Negative.   Psychiatric/Behavioral: Positive for depression. Negative for suicidal ideas, hallucinations, memory loss and substance abuse. The patient is not nervous/anxious and does not have insomnia.     Blood pressure 177/89, pulse 94, temperature 96.5 F (35.8 C), temperature source Tympanic, resp. rate 18, height 6' (1.829 m), weight 97.523 kg (215 lb), SpO2 100 %. Physical Exam  Nursing note and vitals reviewed. Constitutional: He appears well-developed and well-nourished.  HENT:  Head: Normocephalic and atraumatic.  Eyes: Conjunctivae are normal. Pupils are equal, round, and reactive to light.  Neck: Normal range of motion.  Cardiovascular: Normal rate, regular rhythm and normal heart sounds.   Respiratory: Effort normal. No respiratory distress. He has no wheezes. He has no rales.  GI: Soft.  Musculoskeletal: Normal range of motion.  Neurological: He is alert.  Skin: Skin is warm and dry.  Psychiatric: Thought  content normal. His speech is delayed. He is slowed. Cognition and memory are impaired. He exhibits a depressed mood.     Assessment/Plan Continue bilateral ECT into next week on a 3 times a week schedule although we are anticipating possible switch to maintenance very soon and possible discharge into next week if an appropriate plan is in place  [employer] 08/29/2015, 12:25 PM

## 2015-08-29 NOTE — Progress Notes (Signed)
Patient rated his depression 4/10.Stated that he is tired because of ECT.He used wheelchair with a steady gait.Denies suicidal ideation.Passive interaction with peers & staff.Compliant with meds.Appetite good.

## 2015-08-29 NOTE — Progress Notes (Signed)
Inpatient Diabetes Program Recommendations  AACE/ADA: New Consensus Statement on Inpatient Glycemic Control (2015)  Target Ranges:  Prepandial:   less than 140 mg/dL      Peak postprandial:   less than 180 mg/dL (1-2 hours)      Critically ill patients:  140 - 180 mg/dL   Review of Glycemic Control  Diabetes history: Type 2, A1C 7.5% on 07/11/15 Outpatient Diabetes medications:unavailable- came from inpatient Current orders for Inpatient glycemic control: Lantus 12 units qhs  Inpatient Diabetes Program Recommendations:  Consider adding Novolog correction scale 0-9 units tid with meals- he had been well controlled with this while he was inpatient.   Susette Racer, RN, BA, MHA, CDE Diabetes Coordinator Inpatient Diabetes Program  [phone] (Team Pager) [phone] [facility] Office) 08/29/2015 12:45 PM

## 2015-08-29 NOTE — Anesthesia Procedure Notes (Signed)
Date/Time: 08/29/2015 12:33 PM Performed by: Lily Kocher Pre-anesthesia Checklist: Patient identified, Emergency Drugs available, Suction available and Patient being monitored Patient Re-evaluated:Patient Re-evaluated prior to inductionOxygen Delivery Method: Circle system utilized Preoxygenation: Pre-oxygenation with 100% oxygen Intubation Type: IV induction Ventilation: Mask ventilation without difficulty and Mask ventilation throughout procedure Airway Equipment and Method: Bite block Placement Confirmation: positive ETCO2 Dental Injury: Teeth and Oropharynx as per pre-operative assessment

## 2015-08-29 NOTE — Anesthesia Preprocedure Evaluation (Signed)
Anesthesia Evaluation  Patient identified by MRN, date of birth, ID band Patient awake    Reviewed: Allergy & Precautions, NPO status , Patient's Chart, lab work & pertinent test results  Airway Mallampati: III  TM Distance: >3 FB Neck ROM: Limited    Dental  (+) Poor Dentition, Missing   Pulmonary sleep apnea and Continuous Positive Airway Pressure Ventilation ,    Pulmonary exam normal        Cardiovascular Exercise Tolerance: Poor hypertension, Pt. on medications and Pt. on home beta blockers +CHF  Normal cardiovascular exam     Neuro/Psych PSYCHIATRIC DISORDERS Depression Bipolar Disorder    GI/Hepatic   Endo/Other  diabetes, Poorly Controlled, Type 2BG 153.  Renal/GU Hx of UTI.     Musculoskeletal   Abdominal (+)  Abdomen: soft.    Peds  Hematology   Anesthesia Other Findings   Reproductive/Obstetrics                             Anesthesia Physical Anesthesia Plan  ASA: III  Anesthesia Plan: General   Post-op Pain Management:    Induction: Intravenous  Airway Management Planned: Mask  Additional Equipment:   Intra-op Plan:   Post-operative Plan:   Informed Consent: I have reviewed the patients History and Physical, chart, labs and discussed the procedure including the risks, benefits and alternatives for the proposed anesthesia with the patient or authorized representative who has indicated his/her understanding and acceptance.     Plan Discussed with: CRNA  Anesthesia Plan Comments:         Anesthesia Quick Evaluation

## 2015-08-29 NOTE — Procedures (Signed)
ECT SERVICES Physician's Interval Evaluation & Treatment Note  Patient Identification: Christian [name] MRN:  [id number] Date of Evaluation:  08/29/2015 TX #: [id number]  MADRS:   MMSE:   P.E. Findings:  Patient is much more physically active downstairs. He is cleaning up and walking around and attending groups. No new physical complaints.  Psychiatric Interval Note:  Mood affect and cognitive action are much improved  Subjective:  Patient is a 64 y.o. male seen for evaluation for Electroconvulsive Therapy. Patient himself has no specific complaint  Treatment Summary:   []   Right Unilateral             [x]  Bilateral   % Energy : 1.0 ms, 100%   Impedance: 680 ohms  Seizure Energy Index: 2150 V squared  Postictal Suppression Index: 60%  Seizure Concordance Index: 98%  Medications  Pre Shock: Xylocaine 4 mg, labetalol 20 mg, ketamine 120 mg, succinylcholine 120 mg  Post Shock: Labetalol 20 mg, nitroglycerin 100 g  Seizure Duration: 31 seconds by EMG, 74 seconds by EEG   Comments: Continue index course into next week as he is clearly continuing to improve although we are starting to discuss possible discharge plans as well. No other change to treatment plan at this point.   Lungs:  [x]   Clear to auscultation               []  Other:   Heart:    [x]   Regular rhythm             []  irregular rhythm    [x]   Previous H&P reviewed, patient examined and there are NO CHANGES                 []   Previous H&P reviewed, patient examined and there are changes noted.   [name], MD 9/30/201612:28 PM

## 2015-08-29 NOTE — Progress Notes (Signed)
D: Pt is awake and active in the milieu this evening. Pt mood is appropriate and his affect is flat. Pt denies SI/HI and AVH at this time. Pt is ambulating independently and tolerating it well. Pt is toileting independently as well, and is A&OX4.   A: Writer provided emotional support and administered medications as prescribed. Writer reminded pt to avoid food and drink after 2400 hrs due to ECT in the AM.   R: Pt is present on the unit and responds appropriately to staff questions. Pt went to bed following medications administration and snack.

## 2015-08-29 NOTE — Plan of Care (Signed)
Problem: Ineffective individual coping Goal: STG:Pt. will utilize relaxation techniques to reduce stress STG: Patient will utilize relaxation techniques to reduce stress levels  Outcome: Progressing Pt occassionally walks around the unit for exercise.

## 2015-08-29 NOTE — [facility] Group Notes (Signed)
[facility] Group Notes:  (Nursing/MHT/Case Management/Adjunct)  Date:  08/29/2015  Time:  12:07 PM  Type of Therapy:  Psychoeducational Skills  Participation Level:  Did Not Attend    Christian [name] 08/29/2015, 12:07 PM

## 2015-08-29 NOTE — Transfer of Care (Signed)
Immediate Anesthesia Transfer of Care Note  Patient: Christian [name]  Procedure(s) Performed: ECT  Patient Location: PACU  Anesthesia Type:General  Level of Consciousness: sedated  Airway & Oxygen Therapy: Patient Spontanous Breathing and Patient connected to face mask oxygen  Post-op Assessment: Report given to RN  Post vital signs: Reviewed and stable; Dr Dimple Casey aware of increased BP; PACU RN to give labetolo  Last Vitals:  Filed Vitals:   08/29/15 1253  BP: 184/110  Pulse: 104  Temp: 37.1 C  Resp: 23    Complications: No apparent anesthesia complications

## 2015-08-29 NOTE — Anesthesia Postprocedure Evaluation (Signed)
  Anesthesia Post-op Note  Patient: Christian [name]  Procedure(s) Performed: * No procedures listed *  Anesthesia type:General  Patient location: PACU  Post pain: Pain level controlled  Post assessment: Post-op Vital signs reviewed, Patient's Cardiovascular Status Stable, Respiratory Function Stable, Patent Airway and No signs of Nausea or vomiting  Post vital signs: Reviewed and stable  Last Vitals:  Filed Vitals:   08/29/15 1253  BP: 184/110  Pulse: 104  Temp: 37.1 C  Resp: 23    Level of consciousness: awake, alert  and patient cooperative  Complications: No apparent anesthesia complications

## 2015-08-30 ENCOUNTER — Encounter: Payer: Self-pay | Admitting: Psychiatry

## 2015-08-30 DIAGNOSIS — F319 Bipolar disorder, unspecified: Secondary | ICD-10-CM | POA: Insufficient documentation

## 2015-08-30 LAB — GLUCOSE, CAPILLARY
Glucose-Capillary: 283 mg/dL — ABNORMAL HIGH (ref 65–99)
Glucose-Capillary: 221 mg/dL — ABNORMAL HIGH (ref 65–99)
Glucose-Capillary: 145 mg/dL — ABNORMAL HIGH (ref 65–99)
Glucose-Capillary: 222 mg/dL — ABNORMAL HIGH (ref 65–99)

## 2015-08-30 MED ORDER — INSULIN ASPART 100 UNIT/ML ~~LOC~~ SOLN
0.0000 [IU] | Freq: Three times a day (TID) | SUBCUTANEOUS | Status: DC
  Administered 2015-08-30: 2 [IU] via SUBCUTANEOUS
  Administered 2015-08-30: 5 [IU] via SUBCUTANEOUS
  Administered 2015-08-31: 3 [IU] via SUBCUTANEOUS
  Administered 2015-08-31: 5 [IU] via SUBCUTANEOUS
  Filled 2015-08-30: qty 5
  Filled 2015-08-30: qty 3

## 2015-08-30 MED ORDER — INSULIN ASPART 100 UNIT/ML ~~LOC~~ SOLN
4.0000 [IU] | Freq: Three times a day (TID) | SUBCUTANEOUS | Status: DC
  Administered 2015-08-30 – 2015-08-31 (×4): 4 [IU] via SUBCUTANEOUS
  Filled 2015-08-30 (×2): qty 4

## 2015-08-30 MED ORDER — INSULIN GLARGINE 100 UNIT/ML ~~LOC~~ SOLN
15.0000 [IU] | Freq: Every day | SUBCUTANEOUS | Status: DC
  Administered 2015-08-30: 15 [IU] via SUBCUTANEOUS
  Filled 2015-08-30 (×2): qty 0.15

## 2015-08-30 MED ORDER — INSULIN ASPART 100 UNIT/ML ~~LOC~~ SOLN
0.0000 [IU] | Freq: Every day | SUBCUTANEOUS | Status: DC
  Administered 2015-08-30: 3 [IU] via SUBCUTANEOUS

## 2015-08-30 NOTE — Progress Notes (Signed)
Patient with depressed, blunted affect. No SI/HI at this time. Patient with minimal interaction with peers, resting in bed in room. Increasingly verbalizing needs to writer, requesting assist to change into pajama pant and tee shirt, stating "rotator cuff decreases arm movements". Good eye contact, low energy. Ambulates with slow unsteady gait and supervision. Blood glucose monitored with coverage as ordered. No s/s of hypo/hyperglycemia. Assist and support to get room environment comfortable for sleep at this time. Safety maintained.

## 2015-08-30 NOTE — [facility] Group Notes (Signed)
[facility] LCSW Group Therapy Note   Type of Therapy and Topic:  Group Therapy: Establishing a Supportive Framework  Participation Level:  Minimal, good insight  Mood: Depressed  Description of Group:   What is a supportive framework? What does it look like feel like and how do I discern it from and unhealthy non-supportive network? Learn how to cope when supports are not helpful and don't support you. Discuss what to do when your family/friends are not supportive.  Therapeutic Goals Addressed in Processing Group: 1. Patient will identify one healthy supportive network that they can use at discharge. 2. Patient will identify one factor of a supportive framework and how to tell it from an unhealthy network. 3. Patient able to identify one coping skill to use when they do not have positive supports from others. 4. Patient will demonstrate ability to communicate their needs through discussion and/or role plays.   Summary of Patient Progress:   Pt came to group late, but participated in group by filling out worksheet and sharing who his support people are in his recovery. Pt stated to group "No one can do this alone."  Pt left group early.

## 2015-08-30 NOTE — Progress Notes (Signed)
D) Patient pleasant and cooperative upon my assessment. Patient completed the Self Inventory Assessment and rates depression as 3/10, patient rates hopeless feelings as 3/10.  Patient denies SI/HI, denies A/V hallucinations.  Patient's affect is flat  and mood is sad and depressed.   Reports that  his appetite is good.   A) Patient offered support and encouragement, patient encouraged to discuss feelings/concerns with staff. Patient verbalized understanding. Patient monitored Q15 minutes for safety. Patient met with MD  to discuss today's goals and plan of care.  R) Patient visible in milieu, did  attend groups today.  Patient come to dining room for meals and snacks. Patient appropriate with staff and peers.   Patient taking medications as ordered. Will continue to monitor.

## 2015-08-30 NOTE — Progress Notes (Signed)
D: Pt denies SI/HI/AVH. Pt is pleasant and cooperative. Mood is cheerful,  compliant with care and interacting appropriately with peers and staff appropriately.  A: Pt was offered support and encouragement. Pt was given scheduled medications. Pt was encouraged to attend groups. Q 15 minute checks were done for safety.  R:Pt attends groups and interacts appropriately with peers and staff. Pt is compliant with  medication. Pt has no complaints.Pt receptive to treatment and safety maintained on unit.

## 2015-08-30 NOTE — [facility] Group Notes (Signed)
[facility] Group Notes:  (Nursing/MHT/Case Management/Adjunct)  Date:  08/30/2015  Time:  3:43 AM  Type of Therapy:  Group Therapy  Participation Level:  Did Not Attend   Summary of Progress/Problems:  Christian [name] 08/30/2015, 3:43 AM

## 2015-08-30 NOTE — Plan of Care (Signed)
Problem: Consults Goal: Depression Patient Education See Patient Education Module for education specifics.  Outcome: Progressing No SI/HI at this time. Patient remains with depressed, blunted affect. Noted that patient is more spontaneous, increasingly verbalizing needs and safety is maintained.

## 2015-08-30 NOTE — Plan of Care (Signed)
Problem: Alteration in mood Goal: LTG-Patient reports reduction in suicidal thoughts (Patient reports reduction in suicidal thoughts and is able to verbalize a safety plan for whenever patient is feeling suicidal)  Patient denies any SI at this time

## 2015-08-30 NOTE — Progress Notes (Signed)
[facility] MD Progress Note  08/30/2015 10:32 AM Christian [name]  MRN:  [id number] Subjective:  Follow-up for this 64 year old man with severe depression as part of bipolar disorder Principal Problem: Bipolar disorder, current episode depressed, severe, with psychotic features (HCC)    SUBJECTIVE:  Affect was flat but he has been more visible on the unit and interacting more with other patients and staff. Staff has noticed that his affect has been cheerful at times. He says that he has been attending groups although documentation from social work indicates otherwise. He did state that his mood is "a little better" and he denies any current active or passive suicidal thoughts or homicidal thoughts. He denies any auditory or visual hallucinations, paranoid thoughts or delusions currently. The patient says his wife did come to visit him yesterday. He is eating fairly well. The patient denies any current somatic complaints. Blood sugars have been elevated in the high 200s and was 305 last night. Blood sugar has been stable but he has been tachycardic. He slept approximately 5.5 hours last night per nursing.  Diagnosis:   Patient Active Problem List   Diagnosis Date Noted  . Bipolar disorder, now depressed [F31.30] 08/28/2015  . Bipolar I disorder, severe, current or most recent episode depressed, with psychotic features [F31.5]   . Pressure ulcer [L89.90] 07/30/2015  . Protein-calorie malnutrition, severe [E43] 07/30/2015  . Dehydration [E86.0] 07/29/2015  . Bipolar disorder, current episode depressed, severe, with psychotic features [F31.5] 07/09/2015  . Diabetes [E11.9] 07/09/2015  . UTI (urinary tract infection) [N39.0] 06/19/2014  . Positive blood culture [R78.81] 06/19/2014  . Bipolar affective disorder, current episode manic with psychotic symptoms [F31.2] 06/17/2014  . Type II or unspecified type diabetes mellitus with unspecified complication, uncontrolled [E11.8] 06/02/2014  . Other and  unspecified hyperlipidemia [E78.5] 06/02/2014  . OSA on CPAP [G47.33] 06/02/2014  . 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 Gout [M10.9] 06/02/2014  . Hypertension [I10]    Total Time spent with patient: 30 minutes  Past Psychiatric History: Patient has bipolar disorder. Has severe depression. Has been psychotic. Receiving ECT and medication management. Also being treated for his diabetes and high blood pressure and a pressure ulcer  Past Medical History:  Past Medical History  Diagnosis Date  . Mood swings   . Type II or unspecified type diabetes mellitus with unspecified complication, uncontrolled 06/02/2014  . Other and unspecified hyperlipidemia 06/02/2014  . OSA on CPAP 06/02/2014  . 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 Bipolar disorder   . CHF (congestive heart failure)   . CKD (chronic kidney disease), stage III   . Diabetes mellitus without complication   . Hypertension   . Gout   . Bipolar 1 disorder   . OSA on CPAP    History reviewed. No pertinent past surgical history. Family History:  Family History  Problem Relation Age of Onset  . Dementia Mother   . Arthritis Father   . Heart failure Neg Hx   . Kidney failure Neg Hx   . Cancer Neg Hx    Family Psychiatric  History:  The patient reports some family history of mood symptoms    Social History:  History  Alcohol Use No     History  Drug Use No    Social History   Social History  . Marital Status: Married    Spouse Name: N/A  . Number of Children: N/A  . Years  of Education: N/A   Social History Main Topics  . Smoking status: Never Smoker   . Smokeless tobacco: None  . Alcohol Use: No  . Drug Use: No  . Sexual Activity: Not Currently   Other Topics Concern  . None   Social History Narrative   ** Merged History Encounter **       Additional Social History:     The patient has been married for over 30  years and is currently on disability. He lives with his wife in the [place] area and has one daughter.      Sleep: Fair  Appetite:  Good  Current Medications: Current Facility-Administered Medications  Medication Dose Route Frequency Provider Last Rate Last Dose  . 0.9 %  sodium chloride infusion  250 mL Intravenous Once Audery Amel, MD      . acetaminophen (TYLENOL) tablet 650 mg  650 mg Oral Q6H PRN Audery Amel, MD      . allopurinol (ZYLOPRIM) tablet 300 mg  300 mg Oral Daily Audery Amel, MD   300 mg at 08/28/15 2129  . alum & mag hydroxide-simeth (MAALOX/MYLANTA) 200-200-20 MG/5ML suspension 30 mL  30 mL Oral Q4H PRN Audery Amel, MD      . amLODipine (NORVASC) tablet 10 mg  10 mg Oral Daily Audery Amel, MD   10 mg at 08/29/15 0653  . aspirin EC tablet 81 mg  81 mg Oral Daily Audery Amel, MD   81 mg at 08/28/15 2130  . atorvastatin (LIPITOR) tablet 10 mg  10 mg Oral q1800 Audery Amel, MD   10 mg at 08/29/15 1706  . cholecalciferol (VITAMIN D) tablet 800 Units  800 Units Oral Daily Audery Amel, MD   800 Units at 08/28/15 2129  . diltiazem (CARDIZEM CD) 24 hr capsule 120 mg  120 mg Oral Daily Audery Amel, MD   120 mg at 08/29/15 0651  . finasteride (PROSCAR) tablet 5 mg  5 mg Oral Daily Audery Amel, MD   5 mg at 08/28/15 2129  . furosemide (LASIX) tablet 20 mg  20 mg Oral Daily Audery Amel, MD   20 mg at 08/28/15 2129  . hydrALAZINE (APRESOLINE) tablet 25 mg  25 mg Oral 4 times per day Audery Amel, MD   25 mg at 08/30/15 1610  . insulin aspart (novoLOG) injection 0-15 Units  0-15 Units Subcutaneous TID WC Katharina Caper, MD      . insulin aspart (novoLOG) injection 0-5 Units  0-5 Units Subcutaneous QHS Katharina Caper, MD      . insulin aspart (novoLOG) injection 4 Units  4 Units Subcutaneous TID WC Katharina Caper, MD      . insulin glargine (LANTUS) injection 15 Units  15 Units Subcutaneous QHS Katharina Caper, MD      . ketamine (KETALAR) injection 120 mg   120 mg Intravenous Once Audery Amel, MD   120 mg at 08/27/15 2030  . labetalol (NORMODYNE,TRANDATE) injection 10 mg  10 mg Intravenous Q10 min PRN Amy Rice, MD   10 mg at 08/29/15 1317  . labetalol (NORMODYNE,TRANDATE) injection 20 mg  20 mg Intravenous Once Audery Amel, MD   20 mg at 08/27/15 2030  . lidocaine (cardiac) 100 mg/65ml (XYLOCAINE) 20 MG/ML injection 2% 4 mg  4 mg Intravenous Once Audery Amel, MD   4 mg at 08/27/15 2030  . lisinopril (PRINIVIL,ZESTRIL) tablet 10 mg  10 mg Oral  Daily Audery Amel, MD   10 mg at 08/29/15 9562  . magnesium hydroxide (MILK OF MAGNESIA) suspension 30 mL  30 mL Oral Daily PRN Audery Amel, MD      . megestrol (MEGACE) tablet 40 mg  40 mg Oral Daily Audery Amel, MD   40 mg at 08/28/15 2129  . mirtazapine (REMERON) tablet 45 mg  45 mg Oral QHS Audery Amel, MD   45 mg at 08/29/15 2232  . OLANZapine zydis (ZYPREXA) disintegrating tablet 30 mg  30 mg Oral QHS Audery Amel, MD   30 mg at 08/29/15 2232  . succinylcholine (ANECTINE) injection 120 mg  120 mg Intravenous Once Audery Amel, MD   120 mg at 08/27/15 2030  . tamsulosin (FLOMAX) capsule 0.4 mg  0.4 mg Oral QPC supper Audery Amel, MD   0.4 mg at 08/29/15 1706    Lab Results:  Results for orders placed or performed during the hospital encounter of 08/27/15 (from the past 48 hour(s))  Glucose, capillary     Status: Abnormal   Collection Time: 08/28/15  8:29 PM  Result Value Ref Range   Glucose-Capillary 305 (H) 65 - 99 mg/dL  Glucose, capillary     Status: Abnormal   Collection Time: 08/29/15  3:32 AM  Result Value Ref Range   Glucose-Capillary 204 (H) 65 - 99 mg/dL  Glucose, capillary     Status: Abnormal   Collection Time: 08/29/15  6:42 AM  Result Value Ref Range   Glucose-Capillary 153 (H) 65 - 99 mg/dL   Comment 1 Notify RN   Glucose, capillary     Status: Abnormal   Collection Time: 08/29/15  9:10 PM  Result Value Ref Range   Glucose-Capillary 283 (H) 65 - 99 mg/dL    Comment 1 Notify RN    Comment 2 Document in Chart     Musculoskeletal: Strength & Muscle Tone: within normal limits Gait & Station: normal Patient leans: N/A  Psychiatric Specialty Exam: Review of Systems  Constitutional: Negative.  Negative for fever, chills, weight loss and malaise/fatigue.  HENT: Negative.  Negative for congestion, ear pain, hearing loss and tinnitus.   Eyes: Negative.  Negative for blurred vision, double vision, pain and redness.  Respiratory: Negative.  Negative for cough, sputum production and shortness of breath.   Cardiovascular: Negative.  Negative for chest pain, palpitations, orthopnea and leg swelling.  Gastrointestinal: Negative.  Negative for heartburn, nausea, vomiting, abdominal pain, diarrhea and constipation.  Genitourinary: Negative.  Negative for dysuria, urgency and frequency.  Musculoskeletal: Negative.  Negative for myalgias, back pain, joint pain and neck pain.  Skin: Negative.  Negative for itching and rash.  Neurological: Negative.  Negative for dizziness, tingling, tremors, sensory change, focal weakness, seizures, loss of consciousness and headaches.  Endo/Heme/Allergies: Negative.  Negative for environmental allergies. Does not bruise/bleed easily.  Psychiatric/Behavioral: Positive for depression and memory loss. Negative for suicidal ideas, hallucinations and substance abuse. The patient is not nervous/anxious and does not have insomnia.     Blood pressure 132/85, pulse 101, temperature 98.4 F (36.9 C), temperature source Oral, resp. rate 18, height 6' (1.829 m), weight 97.523 kg (215 lb), SpO2 96 %.Body mass index is 29.15 kg/(m^2).  General Appearance: Casual  Eye Contact::  Fair  Speech:  Slow  Volume:  Decreased  Mood:  "OK"  Affect:  Flat  Thought Process:  Linear  Orientation:  Full (Time, Place, and Person)  Thought Content:  Negative  Suicidal Thoughts:  No  Homicidal Thoughts:  No  Memory:  Immediate;   Fair Recent;    Fair Remote;   Fair  Judgement:  Fair  Insight:  Fair  Psychomotor Activity:  Decreased  Concentration:  Fair  Recall:  [employer] of Knowledge:Fair  Language: Fair  Akathisia:  No  Handed:  Right  AIMS (if indicated):     Assets:  Desire for Improvement Financial Resources/Insurance Housing Intimacy Social Support  ADL's:  Intact  Cognition: Impaired,  Mild  Sleep:  Number of Hours: 5.5     Diagnosis Bipolar disorder, type I, severe: MRE Depressed Type 2 diabetes on insulin Hypertension CHF Chronic kidney disease stage III Gout Moderate to severe: Chronic mental illness   ASSESSMENT AND TREATMENT PLAN:  Christian [name] is a 64 year old African-American male with history of bipolar disorder with severe depression he was admitted to the hospital and is now undergoing ECT treatment.  Bipolar disorder, type I, most recent episode depressed: Currently undergoing ECT treatment. He has tolerated treatment fairly well and mood is slowly been improving. The patient denies any current active or passive suicidal thoughts. Plan to continue the patient on Remeron 45 mg by mouth nightly for depression and Zyprexa 30 mg by mouth nightly for psychosis  Hypertension, CHF: Vital signs are stable. We'll continue Norvasc 10 mg by mouth daily, Lisinopril 10 mg by mouth daily, Lasix 20 mg by mouth daily, diltiazem 120 mg by mouth daily and hydralazine 25 mg by mouth 4 times a day. He is also on aspirin 81 mg by mouth daily. We'll check vital signs every 8 hours  Diabetes, insulin-dependent: Spoke with hospitalist on-call and will increase insulin to 15 units at bedtime. Will also add sliding scale insulin. Will continue with diabetic diet.  Hyperlipidemia: We'll continue Lipitor 10 mg by mouth nightly  Gout: Will continue allopurinol daily  BPH: We'll continue the max 0.4 mg by mouth nightly.  Disposition: The patient will follow up with Dr. Emerson Monte. He does have a stable living  situation with his wife.    Denna Fryberger KAMAL 08/30/2015, 10:32 AM

## 2015-08-30 NOTE — Plan of Care (Signed)
Problem: Alteration in mood Goal: LTG-Patient reports reduction in suicidal thoughts (Patient reports reduction in suicidal thoughts and is able to verbalize a safety plan for whenever patient is feeling suicidal)  Outcome: Progressing Patient denies SI/HI.      

## 2015-08-31 ENCOUNTER — Other Ambulatory Visit: Payer: Self-pay

## 2015-08-31 ENCOUNTER — Inpatient Hospital Stay
Admit: 2015-08-31 | Discharge: 2015-09-05 | DRG: 175 | Disposition: A | Discharge status: Other Acute Inpatient Hospital | Payer: Commercial Managed Care - HMO | Source: Ambulatory Visit | Attending: Internal Medicine | Admitting: Internal Medicine

## 2015-08-31 ENCOUNTER — Observation Stay: Payer: Commercial Managed Care - HMO

## 2015-08-31 DIAGNOSIS — R627 Adult failure to thrive: Secondary | ICD-10-CM | POA: Diagnosis present

## 2015-08-31 DIAGNOSIS — Z7982 Long term (current) use of aspirin: Secondary | ICD-10-CM | POA: Diagnosis not present

## 2015-08-31 DIAGNOSIS — J189 Pneumonia, unspecified organism: Secondary | ICD-10-CM | POA: Diagnosis present

## 2015-08-31 DIAGNOSIS — J9621 Acute and chronic respiratory failure with hypoxia: Secondary | ICD-10-CM

## 2015-08-31 DIAGNOSIS — E785 Hyperlipidemia, unspecified: Secondary | ICD-10-CM | POA: Diagnosis present

## 2015-08-31 DIAGNOSIS — R911 Solitary pulmonary nodule: Secondary | ICD-10-CM | POA: Diagnosis present

## 2015-08-31 DIAGNOSIS — F333 Major depressive disorder, recurrent, severe with psychotic symptoms: Secondary | ICD-10-CM | POA: Diagnosis not present

## 2015-08-31 DIAGNOSIS — E860 Dehydration: Secondary | ICD-10-CM | POA: Diagnosis present

## 2015-08-31 DIAGNOSIS — I2609 Other pulmonary embolism with acute cor pulmonale: Secondary | ICD-10-CM

## 2015-08-31 DIAGNOSIS — E119 Type 2 diabetes mellitus without complications: Secondary | ICD-10-CM | POA: Diagnosis present

## 2015-08-31 DIAGNOSIS — F315 Bipolar disorder, current episode depressed, severe, with psychotic features: Secondary | ICD-10-CM | POA: Diagnosis not present

## 2015-08-31 DIAGNOSIS — I951 Orthostatic hypotension: Secondary | ICD-10-CM | POA: Diagnosis not present

## 2015-08-31 DIAGNOSIS — J9601 Acute respiratory failure with hypoxia: Secondary | ICD-10-CM | POA: Diagnosis present

## 2015-08-31 DIAGNOSIS — F3175 Bipolar disorder, in partial remission, most recent episode depressed: Secondary | ICD-10-CM

## 2015-08-31 DIAGNOSIS — I4891 Unspecified atrial fibrillation: Secondary | ICD-10-CM | POA: Diagnosis present

## 2015-08-31 DIAGNOSIS — N183 Chronic kidney disease, stage 3 (moderate): Secondary | ICD-10-CM | POA: Diagnosis not present

## 2015-08-31 DIAGNOSIS — I509 Heart failure, unspecified: Secondary | ICD-10-CM | POA: Diagnosis not present

## 2015-08-31 DIAGNOSIS — R0602 Shortness of breath: Secondary | ICD-10-CM | POA: Diagnosis not present

## 2015-08-31 DIAGNOSIS — F319 Bipolar disorder, unspecified: Secondary | ICD-10-CM | POA: Diagnosis not present

## 2015-08-31 DIAGNOSIS — R7989 Other specified abnormal findings of blood chemistry: Secondary | ICD-10-CM

## 2015-08-31 DIAGNOSIS — R55 Syncope and collapse: Secondary | ICD-10-CM | POA: Diagnosis not present

## 2015-08-31 DIAGNOSIS — I878 Other specified disorders of veins: Secondary | ICD-10-CM | POA: Diagnosis present

## 2015-08-31 DIAGNOSIS — I6522 Occlusion and stenosis of left carotid artery: Secondary | ICD-10-CM | POA: Diagnosis not present

## 2015-08-31 DIAGNOSIS — I10 Essential (primary) hypertension: Secondary | ICD-10-CM | POA: Diagnosis not present

## 2015-08-31 DIAGNOSIS — R400 Somnolence: Secondary | ICD-10-CM | POA: Diagnosis not present

## 2015-08-31 DIAGNOSIS — I2699 Other pulmonary embolism without acute cor pulmonale: Secondary | ICD-10-CM | POA: Diagnosis not present

## 2015-08-31 DIAGNOSIS — R0600 Dyspnea, unspecified: Secondary | ICD-10-CM | POA: Diagnosis not present

## 2015-08-31 DIAGNOSIS — Z794 Long term (current) use of insulin: Secondary | ICD-10-CM | POA: Diagnosis not present

## 2015-08-31 DIAGNOSIS — I639 Cerebral infarction, unspecified: Secondary | ICD-10-CM

## 2015-08-31 DIAGNOSIS — I248 Other forms of acute ischemic heart disease: Secondary | ICD-10-CM | POA: Diagnosis not present

## 2015-08-31 DIAGNOSIS — I2510 Atherosclerotic heart disease of native coronary artery without angina pectoris: Secondary | ICD-10-CM | POA: Diagnosis present

## 2015-08-31 DIAGNOSIS — R0989 Other specified symptoms and signs involving the circulatory and respiratory systems: Secondary | ICD-10-CM

## 2015-08-31 DIAGNOSIS — I82413 Acute embolism and thrombosis of femoral vein, bilateral: Secondary | ICD-10-CM | POA: Diagnosis present

## 2015-08-31 DIAGNOSIS — I130 Hypertensive heart and chronic kidney disease with heart failure and stage 1 through stage 4 chronic kidney disease, or unspecified chronic kidney disease: Secondary | ICD-10-CM | POA: Diagnosis not present

## 2015-08-31 DIAGNOSIS — M109 Gout, unspecified: Secondary | ICD-10-CM | POA: Diagnosis present

## 2015-08-31 DIAGNOSIS — G4733 Obstructive sleep apnea (adult) (pediatric): Secondary | ICD-10-CM | POA: Diagnosis present

## 2015-08-31 DIAGNOSIS — R778 Other specified abnormalities of plasma proteins: Secondary | ICD-10-CM

## 2015-08-31 DIAGNOSIS — Z452 Encounter for adjustment and management of vascular access device: Secondary | ICD-10-CM

## 2015-08-31 DIAGNOSIS — R000 Tachycardia, unspecified: Secondary | ICD-10-CM

## 2015-08-31 LAB — COMPREHENSIVE METABOLIC PANEL
ALT: 20 U/L (ref 17–63)
AST: 15 U/L (ref 15–41)
Albumin: 2.9 g/dL — ABNORMAL LOW (ref 3.5–5.0)
Alkaline Phosphatase: 62 U/L (ref 38–126)
Anion gap: 7 (ref 5–15)
BUN: 21 mg/dL — ABNORMAL HIGH (ref 6–20)
CO2: 27 mmol/L (ref 22–32)
Calcium: 9.3 mg/dL (ref 8.9–10.3)
Chloride: 104 mmol/L (ref 101–111)
Creatinine, Ser: 1.01 mg/dL (ref 0.61–1.24)
GFR calc Af Amer: >60 mL/min (ref >60)
GFR calc non Af Amer: >60 mL/min (ref >60)
Glucose, Bld: 208 mg/dL — ABNORMAL HIGH (ref 65–99)
Potassium: 4.4 mmol/L (ref 3.5–5.1)
Sodium: 138 mmol/L (ref 135–145)
Total Bilirubin: 0.8 mg/dL (ref 0.3–1.2)
Total Protein: 6.3 g/dL — ABNORMAL LOW (ref 6.5–8.1)

## 2015-08-31 LAB — CBC WITH DIFFERENTIAL/PLATELET
Basophils Absolute: 0 10*3/uL (ref 0–0.1)
Basophils Relative: 1 %
Eosinophils Absolute: 0.1 10*3/uL (ref 0–0.7)
Eosinophils Relative: 2 %
HCT: 38.9 % — ABNORMAL LOW (ref 40.0–52.0)
Hemoglobin: 12.8 g/dL — ABNORMAL LOW (ref 13.0–18.0)
Lymphocytes Relative: 23 %
Lymphs Abs: 1.1 10*3/uL (ref 1.0–3.6)
MCH: 29.8 pg (ref 26.0–34.0)
MCHC: 33 g/dL (ref 32.0–36.0)
MCV: 90.3 fL (ref 80.0–100.0)
Monocytes Absolute: 0.5 10*3/uL (ref 0.2–1.0)
Monocytes Relative: 9 %
Neutro Abs: 3.3 10*3/uL (ref 1.4–6.5)
Neutrophils Relative %: 65 %
Platelets: 172 10*3/uL (ref 150–440)
RBC: 4.3 MIL/uL — ABNORMAL LOW (ref 4.40–5.90)
RDW: 13.9 % (ref 11.5–14.5)
WBC: 5 10*3/uL (ref 3.8–10.6)

## 2015-08-31 LAB — GLUCOSE, CAPILLARY
Glucose-Capillary: 173 mg/dL — ABNORMAL HIGH (ref 65–99)
Glucose-Capillary: 207 mg/dL — ABNORMAL HIGH (ref 65–99)
Glucose-Capillary: 122 mg/dL — ABNORMAL HIGH (ref 65–99)
Glucose-Capillary: 152 mg/dL — ABNORMAL HIGH (ref 65–99)

## 2015-08-31 LAB — TROPONIN I: Troponin I: 0.06 ng/mL — ABNORMAL HIGH (ref <0.031)

## 2015-08-31 MED ORDER — ASPIRIN EC 81 MG PO TBEC
81.0000 mg | DELAYED_RELEASE_TABLET | Freq: Every day | ORAL | Status: DC
  Administered 2015-09-01 – 2015-09-05 (×5): 81 mg via ORAL
  Filled 2015-08-31 (×6): qty 1

## 2015-08-31 MED ORDER — MIRTAZAPINE 15 MG PO TABS
30.0000 mg | ORAL_TABLET | Freq: Every day | ORAL | Status: DC
  Administered 2015-08-31: 30 mg via ORAL
  Filled 2015-08-31: qty 2

## 2015-08-31 MED ORDER — SODIUM CHLORIDE 0.9 % IJ SOLN: 3.0000 mL | Freq: Two times a day (BID) | INTRAMUSCULAR | Status: DC

## 2015-08-31 MED ORDER — MEGESTROL ACETATE 400 MG/10ML PO SUSP
400.0000 mg | Freq: Every day | ORAL | Status: DC
  Administered 2015-09-01 – 2015-09-03 (×3): 400 mg via ORAL
  Filled 2015-08-31 (×6): qty 10

## 2015-08-31 MED ORDER — FUROSEMIDE 20 MG PO TABS: 20.0000 mg | ORAL_TABLET | Freq: Every day | ORAL | Status: DC

## 2015-08-31 MED ORDER — CHOLECALCIFEROL 10 MCG (400 UNIT) PO TABS
800.0000 [IU] | ORAL_TABLET | Freq: Every day | ORAL | Status: DC
  Administered 2015-09-01 – 2015-09-05 (×5): 800 [IU] via ORAL
  Filled 2015-08-31 (×6): qty 2

## 2015-08-31 MED ORDER — DOCUSATE SODIUM 100 MG PO CAPS
200.0000 mg | ORAL_CAPSULE | Freq: Two times a day (BID) | ORAL | Status: DC
  Administered 2015-08-31 – 2015-09-03 (×7): 200 mg via ORAL
  Filled 2015-08-31 (×9): qty 2

## 2015-08-31 MED ORDER — SODIUM CHLORIDE 0.9 % IJ SOLN: 3.0000 mL | INTRAMUSCULAR | Status: DC | PRN

## 2015-08-31 MED ORDER — INSULIN ASPART 100 UNIT/ML ~~LOC~~ SOLN: 0.0000 [IU] | Freq: Three times a day (TID) | SUBCUTANEOUS | Status: DC

## 2015-08-31 MED ORDER — POLYETHYLENE GLYCOL 3350 17 G PO PACK
17.0000 g | PACK | Freq: Every day | ORAL | Status: DC
  Administered 2015-09-01 – 2015-09-03 (×3): 17 g via ORAL
  Filled 2015-08-31 (×5): qty 1

## 2015-08-31 MED ORDER — ACETAMINOPHEN 325 MG PO TABS: 650.0000 mg | ORAL_TABLET | Freq: Four times a day (QID) | ORAL | Status: DC | PRN

## 2015-08-31 MED ORDER — TAMSULOSIN HCL 0.4 MG PO CAPS
0.4000 mg | ORAL_CAPSULE | Freq: Every day | ORAL | Status: DC
  Administered 2015-08-31 – 2015-09-04 (×5): 0.4 mg via ORAL
  Filled 2015-08-31 (×5): qty 1

## 2015-08-31 MED ORDER — SODIUM CHLORIDE 0.9 % IV SOLN: 250.0000 mL | INTRAVENOUS | Status: DC | PRN

## 2015-08-31 MED ORDER — INSULIN GLARGINE 100 UNIT/ML ~~LOC~~ SOLN
12.0000 [IU] | Freq: Every day | SUBCUTANEOUS | Status: DC
  Administered 2015-08-31: 12 [IU] via SUBCUTANEOUS
  Filled 2015-08-31 (×2): qty 0.12

## 2015-08-31 MED ORDER — SODIUM CHLORIDE 0.9 % IJ SOLN
10.0000 mL | Freq: Two times a day (BID) | INTRAMUSCULAR | Status: DC
  Administered 2015-08-31 – 2015-09-04 (×6): 10 mL via INTRAVENOUS

## 2015-08-31 MED ORDER — AMLODIPINE BESYLATE 10 MG PO TABS: 10.0000 mg | ORAL_TABLET | Freq: Every day | ORAL | Status: DC

## 2015-08-31 MED ORDER — GLUCERNA SHAKE PO LIQD
237.0000 mL | Freq: Three times a day (TID) | ORAL | Status: DC
  Administered 2015-09-01 – 2015-09-05 (×12): 237 mL via ORAL

## 2015-08-31 MED ORDER — METOPROLOL SUCCINATE ER 100 MG PO TB24
100.0000 mg | ORAL_TABLET | Freq: Two times a day (BID) | ORAL | Status: DC
  Administered 2015-08-31 – 2015-09-04 (×8): 100 mg via ORAL
  Filled 2015-08-31 (×10): qty 1

## 2015-08-31 MED ORDER — ATORVASTATIN CALCIUM 10 MG PO TABS
10.0000 mg | ORAL_TABLET | Freq: Every day | ORAL | Status: DC
  Administered 2015-09-01 – 2015-09-05 (×5): 10 mg via ORAL
  Filled 2015-08-31 (×6): qty 1

## 2015-08-31 MED ORDER — HYDRALAZINE HCL 25 MG PO TABS
25.0000 mg | ORAL_TABLET | Freq: Four times a day (QID) | ORAL | Status: DC
  Administered 2015-08-31 – 2015-09-04 (×13): 25 mg via ORAL
  Filled 2015-08-31 (×15): qty 1

## 2015-08-31 MED ORDER — LISINOPRIL 10 MG PO TABS
10.0000 mg | ORAL_TABLET | Freq: Every day | ORAL | Status: DC
  Administered 2015-09-01 – 2015-09-04 (×4): 10 mg via ORAL
  Filled 2015-08-31 (×5): qty 1

## 2015-08-31 MED ORDER — DILTIAZEM HCL ER COATED BEADS 120 MG PO CP24
120.0000 mg | ORAL_CAPSULE | Freq: Every day | ORAL | Status: DC
  Administered 2015-09-01 – 2015-09-04 (×4): 120 mg via ORAL
  Filled 2015-08-31 (×5): qty 1

## 2015-08-31 MED ORDER — OLANZAPINE 5 MG PO TBDP
30.0000 mg | ORAL_TABLET | Freq: Every day | ORAL | Status: DC
  Administered 2015-08-31 – 2015-09-04 (×5): 30 mg via ORAL
  Filled 2015-08-31 (×3): qty 6
  Filled 2015-08-31: qty 3
  Filled 2015-08-31: qty 6

## 2015-08-31 MED ORDER — ACETAMINOPHEN 650 MG RE SUPP: 650.0000 mg | Freq: Four times a day (QID) | RECTAL | Status: DC | PRN

## 2015-08-31 MED ORDER — ONDANSETRON HCL 4 MG PO TABS: 4.0000 mg | ORAL_TABLET | Freq: Four times a day (QID) | ORAL | Status: DC | PRN

## 2015-08-31 MED ORDER — FINASTERIDE 5 MG PO TABS
5.0000 mg | ORAL_TABLET | Freq: Every day | ORAL | Status: DC
  Administered 2015-09-01 – 2015-09-05 (×5): 5 mg via ORAL
  Filled 2015-08-31 (×6): qty 1

## 2015-08-31 MED ORDER — CLONAZEPAM 1 MG PO TABS
1.0000 mg | ORAL_TABLET | Freq: Every day | ORAL | Status: DC
  Administered 2015-08-31 – 2015-09-04 (×5): 1 mg via ORAL
  Filled 2015-08-31 (×5): qty 1

## 2015-08-31 NOTE — Progress Notes (Signed)
Patient ID: Christian [name], male   DOB: 1951-01-20, 64 y.o.   MRN: [id number]   Assessment was completed on 07/17/2015 prior to transfer to medical floor.   Daisy Floro Guilianna Mckoy MSW, LCSWA  08/31/2015 12:51 PM
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
## 2015-08-31 NOTE — Progress Notes (Signed)
Pt transferred to room 246 on telemetry. Pt denies SI and A/V hallucinations.

## 2015-08-31 NOTE — H&P (Signed)
Christian [name] is an 64 y.o. male.   Chief Complaint: Dizziness HPI: Patient was admitted to behavioral health on 9/30. We were asked to see patient today after complaints of dizziness and lightheadedness when he stands. He also felt like he was going to pass out and fell. Was found to be orthostatic by BP and pulse. Has had adequate PO intake. He has hx of noncompliance as an out patient. Pt had EKG that showed rapid afib on 8/12. Today's ekg shows sinus tach. Cannot monitor with tele in [facility] unit so decission was made to transfer to tele for observation.  Past Medical History  Diagnosis Date  . Mood swings (HCC)   . Type II or unspecified type diabetes mellitus with unspecified complication, uncontrolled 06/02/2014  . Other and unspecified hyperlipidemia 06/02/2014  . OSA on CPAP 06/02/2014  . 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 Bipolar disorder (HCC)   . CHF (congestive heart failure) (HCC)   . CKD (chronic kidney disease), stage III   . Diabetes mellitus without complication (HCC)   . Hypertension   . Gout   . Bipolar 1 disorder (HCC)   . OSA on CPAP     History reviewed. No pertinent past surgical history.  Family History  Problem Relation Age of Onset  . Dementia Mother   . Arthritis Father   . Heart failure Neg Hx   . Kidney failure Neg Hx   . Cancer Neg Hx    Social History:  reports that he has never smoked. He does not have any smokeless tobacco history on file. He reports that he does not drink alcohol or use illicit drugs.  Allergies: No Known Allergies  Medications Prior to Admission  Medication Sig Dispense Refill  . allopurinol (ZYLOPRIM) 300 MG tablet Take 1 tablet (300 mg total) by mouth daily. For gout    . amLODipine (NORVASC) 10 MG tablet Take 10 mg by mouth daily.    Marland Kitchen aspirin EC 81 MG tablet Take 81 mg by mouth daily.    Marland Kitchen atorvastatin (LIPITOR) 10 MG tablet Take 1 tablet (10 mg total) by mouth daily. For high  cholesterol    . cholecalciferol (VITAMIN D) 400 UNITS TABS tablet Take 800 Units by mouth daily.    . clonazePAM (KLONOPIN) 1 MG tablet Take 1 tablet (1 mg total) by mouth at bedtime. 30 tablet 0  . colchicine 0.6 MG tablet Take 1 tablet (0.6 mg total) by mouth 2 (two) times daily. For Gout pain (Patient taking differently: Take 0.6 mg by mouth daily. For Gout pain)    . diltiazem (CARDIZEM CD) 120 MG 24 hr capsule Take 1 capsule (120 mg total) by mouth daily. 30 capsule 0  . docusate sodium (COLACE) 100 MG capsule Take 2 capsules (200 mg total) by mouth 2 (two) times daily. 10 capsule 0  . feeding supplement, GLUCERNA SHAKE, (GLUCERNA SHAKE) LIQD Take 237 mLs by mouth 3 (three) times daily with meals.  0  . finasteride (PROSCAR) 5 MG tablet Take 1 tablet (5 mg total) by mouth daily. 1 tablet 0  . furosemide (LASIX) 20 MG tablet Take 20 mg by mouth daily.    . hydrALAZINE (APRESOLINE) 25 MG tablet Take 1 tablet (25 mg total) by mouth every 6 (six) hours.    . insulin aspart (NOVOLOG) 100 UNIT/ML injection Inject 0-9 Units into the skin 3 (three) times daily with meals. 10 mL 11  . insulin glargine (LANTUS) 100  UNIT/ML injection Inject 0.12 mLs (12 Units total) into the skin at bedtime. 10 mL 11  . lisinopril (PRINIVIL,ZESTRIL) 10 MG tablet Take 1 tablet (10 mg total) by mouth daily. 1 tablet 0  . megestrol (MEGACE) 400 MG/10ML suspension Take 10 mLs (400 mg total) by mouth daily. 240 mL 0  . metoprolol (TOPROL-XL) 200 MG 24 hr tablet Take 1 tablet (200 mg total) by mouth daily. For high blood pressure (Patient taking differently: Take 100 mg by mouth 2 (two) times daily. For high blood pressure)    . mirtazapine (REMERON) 30 MG tablet Take 30 mg by mouth at bedtime.    Marland Kitchen OLANZapine zydis (ZYPREXA) 15 MG disintegrating tablet Take 2 tablets (30 mg total) by mouth at bedtime. 1 tablet 0  . ondansetron (ZOFRAN) 4 MG tablet Take 1 tablet (4 mg total) by mouth every 6 (six) hours as needed for nausea.  20 tablet 0  . polyethylene glycol (MIRALAX / GLYCOLAX) packet Take 17 g by mouth daily. 14 each 0  . tamsulosin (FLOMAX) 0.4 MG CAPS capsule Take 1 capsule (0.4 mg total) by mouth at bedtime. 30 capsule 9  . traZODone (DESYREL) 50 MG tablet Take 0.5 tablets (25 mg total) by mouth 3 (three) times daily. (Patient taking differently: Take 200 mg by mouth at bedtime. )      Results for orders placed or performed during the hospital encounter of 08/31/15 (from the past 48 hour(s))  Glucose, capillary     Status: Abnormal   Collection Time: 08/31/15  4:23 PM  Result Value Ref Range   Glucose-Capillary 122 (H) 65 - 99 mg/dL   Comment 1 Notify RN    Comment 2 Document in Chart    No results found.  Review of Systems  Constitutional: Negative for fever and chills.  HENT: Negative for hearing loss.   Eyes: Negative for blurred vision.  Respiratory: Negative for shortness of breath.   Cardiovascular: Positive for palpitations and leg swelling. Negative for chest pain.  Gastrointestinal: Negative for nausea and vomiting.  Genitourinary: Negative for dysuria.  Musculoskeletal: Positive for joint pain.  Skin: Negative for rash.  Neurological: Negative for focal weakness.  Endo/Heme/Allergies: Does not bruise/bleed easily.  Psychiatric/Behavioral: Negative for depression.    There were no vitals taken for this visit. Physical Exam  Constitutional: He is oriented to person, place, and time.  Obese male in NAD  HENT:  Head: Normocephalic and atraumatic.  Mouth/Throat: Oropharynx is clear and moist. No oropharyngeal exudate.  Eyes: EOM are normal. Pupils are equal, round, and reactive to light. No scleral icterus.  Neck: Neck supple. No JVD present. No tracheal deviation present. No thyromegaly present.  Cardiovascular:  Tachy with no murmurs  Respiratory:  Clear to ascultation No dullness to percussion No use of accessary muscles  GI: Soft. He exhibits no mass. There is no tenderness.   Musculoskeletal: Normal range of motion. He exhibits edema. He exhibits no tenderness.  Lymphadenopathy:    He has no cervical adenopathy.  Neurological: He is alert and oriented to person, place, and time. No cranial nerve deficit.  Skin: Skin is warm. No rash noted. No erythema.  Psychiatric:  Flat affect     Assessment/Plan 1. Near Syncope: Pt was orthostatic and is on multiple BP meds. Will d/c norvasc for now. Also, suspect pt may have paroxysmal afib with RVR. Monitor on tele. Check cardiac enzymes.  2. Orthostatic hypotension: Holding norvasc. Will adjust other BP meds as needed. Will also hold  lasix for now also. Recheck orthostatics in am.  3. Tachycardia: EKG today shows sinus tach.EKG on 8/12 showed afib. HR today was as high as 142 in [facility]. Monitor on tele for afib. Continue toporol and cardizem.  4. HTN: Adjust meds to avoid #2  5. DM: On insulin  6. Bipolar Disorder: Followed by psychiatry.  Case discussed with Dr Maryruth Bun. Past medical records reviewed   Time spent= 90 min  Gracelyn Nurse 08/31/2015, 4:46 PM

## 2015-08-31 NOTE — Progress Notes (Addendum)
[facility] MD Progress Note  08/31/2015 11:01 AM Christian [name]  MRN:  [id number] Subjective:  Follow-up for this 64 year old man with severe depression as part of bipolar disorder Principal Problem: Bipolar disorder, current episode depressed, severe, with psychotic features (HCC)    SUBJECTIVE:  Affect was flat but he has been more visible on the unit and interacting more with other patients and staff. Staff has noticed that his affect has been cheerful at times. He says that he has been attending groups although documentation from social work indicates otherwise. He did state that his mood is "a little better" and he denies any current active or passive suicidal thoughts or homicidal thoughts. He denies any auditory or visual hallucinations, paranoid thoughts or delusions currently. The patient says his wife did come to visit him yesterday. He is eating fairly well. The patient denies any current somatic complaints. Blood sugars have been elevated in the high 200s and was 305 last night. Blood sugar has been stable but he has been tachycardic. He slept approximately 5.5 hours last night per nursing.  Diagnosis:   Patient Active Problem List   Diagnosis Date Noted  . Affective psychosis, bipolar (HCC) [F31.9]   . Bipolar disorder, now depressed (HCC) [F31.30] 08/28/2015  . Bipolar I disorder, severe, current or most recent episode depressed, with psychotic features (HCC) [F31.5]   . Pressure ulcer [L89.90] 07/30/2015  . Protein-calorie malnutrition, severe (HCC) [E43] 07/30/2015  . Dehydration [E86.0] 07/29/2015  . Bipolar disorder, current episode depressed, severe, with psychotic features (HCC) [F31.5] 07/09/2015  . Diabetes (HCC) [E11.9] 07/09/2015  . UTI (urinary tract infection) [N39.0] 06/19/2014  . Positive blood culture [R78.81] 06/19/2014  . Bipolar affective disorder, current episode manic with psychotic symptoms (HCC) [F31.2] 06/17/2014  . Type II or unspecified type diabetes  mellitus with unspecified complication, uncontrolled [E11.8, E11.65] 06/02/2014  . Other and unspecified hyperlipidemia [E78.5] 06/02/2014  . OSA on CPAP [G47.33] 06/02/2014  . 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 Gout [M10.9] 06/02/2014  . Hypertension [I10]    Total Time spent with patient: 30 minutes  Past Psychiatric History: Patient has bipolar disorder. Has severe depression. Has been psychotic. Receiving ECT and medication management. Also being treated for his diabetes and high blood pressure and a pressure ulcer  Past Medical History:  Past Medical History  Diagnosis Date  . Mood swings (HCC)   . Type II or unspecified type diabetes mellitus with unspecified complication, uncontrolled 06/02/2014  . Other and unspecified hyperlipidemia 06/02/2014  . OSA on CPAP 06/02/2014  . 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 Bipolar disorder (HCC)   . CHF (congestive heart failure) (HCC)   . CKD (chronic kidney disease), stage III   . Diabetes mellitus without complication (HCC)   . Hypertension   . Gout   . Bipolar 1 disorder (HCC)   . OSA on CPAP    History reviewed. No pertinent past surgical history. Family History:  Family History  Problem Relation Age of Onset  . Dementia Mother   . Arthritis Father   . Heart failure Neg Hx   . Kidney failure Neg Hx   . Cancer Neg Hx    Family Psychiatric  History:  The patient reports some family history of mood symptoms    Social History:  History  Alcohol Use No     History  Drug Use No    Social History   Social History  .  Marital Status: Married    Spouse Name: N/A  . Number of Children: N/A  . Years of Education: N/A   Social History Main Topics  . Smoking status: Never Smoker   . Smokeless tobacco: None  . Alcohol Use: No  . Drug Use: No  . Sexual Activity: Not Currently   Other Topics Concern  . None   Social History  Narrative   *The patient has been married for over 30 years and is currently on disability. He lives with his wife in the [place] area and has one daughter.             Additional Social History:     The patient has been married for over 30 years and is currently on disability. He lives with his wife in the [place] area and has one daughter.      Sleep: Fair  Appetite:  Good  Current Medications: Current Facility-Administered Medications  Medication Dose Route Frequency Provider Last Rate Last Dose  . 0.9 %  sodium chloride infusion  250 mL Intravenous Once Audery Amel, MD   250 mL at 08/30/15 2349  . acetaminophen (TYLENOL) tablet 650 mg  650 mg Oral Q6H PRN Audery Amel, MD   650 mg at 08/31/15 0657  . allopurinol (ZYLOPRIM) tablet 300 mg  300 mg Oral Daily Audery Amel, MD   300 mg at 08/31/15 0939  . alum & mag hydroxide-simeth (MAALOX/MYLANTA) 200-200-20 MG/5ML suspension 30 mL  30 mL Oral Q4H PRN Audery Amel, MD      . amLODipine (NORVASC) tablet 10 mg  10 mg Oral Daily Audery Amel, MD   10 mg at 08/31/15 0940  . aspirin EC tablet 81 mg  81 mg Oral Daily Audery Amel, MD   81 mg at 08/31/15 0940  . atorvastatin (LIPITOR) tablet 10 mg  10 mg Oral q1800 Audery Amel, MD   10 mg at 08/30/15 1736  . cholecalciferol (VITAMIN D) tablet 800 Units  800 Units Oral Daily Audery Amel, MD   800 Units at 08/31/15 [phone]  . diltiazem (CARDIZEM CD) 24 hr capsule 120 mg  120 mg Oral Daily Audery Amel, MD   120 mg at 08/31/15 0939  . finasteride (PROSCAR) tablet 5 mg  5 mg Oral Daily Audery Amel, MD   5 mg at 08/31/15 0941  . furosemide (LASIX) tablet 20 mg  20 mg Oral Daily Audery Amel, MD   20 mg at 08/31/15 0940  . hydrALAZINE (APRESOLINE) tablet 25 mg  25 mg Oral 4 times per day Audery Amel, MD   25 mg at 08/31/15 0655  . insulin aspart (novoLOG) injection 0-15 Units  0-15 Units Subcutaneous TID WC Katharina Caper, MD   3 Units at 08/31/15 0825  . insulin aspart  (novoLOG) injection 0-5 Units  0-5 Units Subcutaneous QHS Katharina Caper, MD   3 Units at 08/30/15 2250  . insulin aspart (novoLOG) injection 4 Units  4 Units Subcutaneous TID WC Katharina Caper, MD   4 Units at 08/31/15 0826  . insulin glargine (LANTUS) injection 15 Units  15 Units Subcutaneous QHS Katharina Caper, MD   15 Units at 08/30/15 2248  . ketamine (KETALAR) injection 120 mg  120 mg Intravenous Once Audery Amel, MD   120 mg at 08/27/15 2030  . labetalol (NORMODYNE,TRANDATE) injection 10 mg  10 mg Intravenous Q10 min PRN Amy Rice, MD   10 mg at 08/29/15  1317  . labetalol (NORMODYNE,TRANDATE) injection 20 mg  20 mg Intravenous Once Audery Amel, MD   20 mg at 08/27/15 2030  . lidocaine (cardiac) 100 mg/29ml (XYLOCAINE) 20 MG/ML injection 2% 4 mg  4 mg Intravenous Once Audery Amel, MD   4 mg at 08/27/15 2030  . lisinopril (PRINIVIL,ZESTRIL) tablet 10 mg  10 mg Oral Daily Audery Amel, MD   10 mg at 08/31/15 0940  . magnesium hydroxide (MILK OF MAGNESIA) suspension 30 mL  30 mL Oral Daily PRN Audery Amel, MD      . megestrol (MEGACE) tablet 40 mg  40 mg Oral Daily Audery Amel, MD   40 mg at 08/31/15 0940  . mirtazapine (REMERON) tablet 45 mg  45 mg Oral QHS Audery Amel, MD   45 mg at 08/30/15 2241  . OLANZapine zydis (ZYPREXA) disintegrating tablet 30 mg  30 mg Oral QHS Audery Amel, MD   30 mg at 08/30/15 2241  . succinylcholine (ANECTINE) injection 120 mg  120 mg Intravenous Once Audery Amel, MD   120 mg at 08/27/15 2030  . tamsulosin (FLOMAX) capsule 0.4 mg  0.4 mg Oral QPC supper Audery Amel, MD   0.4 mg at 08/30/15 1736    Lab Results:  Results for orders placed or performed during the hospital encounter of 08/27/15 (from the past 48 hour(s))  Glucose, capillary     Status: Abnormal   Collection Time: 08/29/15  9:10 PM  Result Value Ref Range   Glucose-Capillary 283 (H) 65 - 99 mg/dL   Comment 1 Notify RN    Comment 2 Document in Chart   Glucose, capillary      Status: Abnormal   Collection Time: 08/30/15 11:43 AM  Result Value Ref Range   Glucose-Capillary 221 (H) 65 - 99 mg/dL   Comment 1 Notify RN    Comment 2 Document in Chart   Glucose, capillary     Status: Abnormal   Collection Time: 08/30/15  4:39 PM  Result Value Ref Range   Glucose-Capillary 145 (H) 65 - 99 mg/dL  Glucose, capillary     Status: Abnormal   Collection Time: 08/30/15 10:47 PM  Result Value Ref Range   Glucose-Capillary 222 (H) 65 - 99 mg/dL  Glucose, capillary     Status: Abnormal   Collection Time: 08/31/15  6:52 AM  Result Value Ref Range   Glucose-Capillary 173 (H) 65 - 99 mg/dL   Comment 1 Notify RN    Comment 2 Document in Chart     Musculoskeletal: Strength & Muscle Tone: within normal limits Gait & Station: normal Patient leans: unsteady on his feet  Psychiatric Specialty Exam: Review of Systems  Constitutional: Negative.  Negative for fever, chills, weight loss and malaise/fatigue.  HENT: Negative for congestion, ear pain, hearing loss, sore throat and tinnitus.        The patient complains of feeling lightheaded and dizzy and unsteady on his feet. He denies any vision changes. He denies any headaches.  Eyes: Negative.  Negative for blurred vision, double vision, photophobia, pain and redness.  Respiratory: Positive for shortness of breath. Negative for cough, sputum production and wheezing.        Mild shortness of breath  Cardiovascular: Negative.  Negative for chest pain, palpitations, orthopnea and leg swelling.  Gastrointestinal: Negative.  Negative for heartburn, nausea, vomiting, abdominal pain, diarrhea and constipation.  Genitourinary: Negative.  Negative for dysuria, urgency and frequency.  Musculoskeletal:  Negative.  Negative for myalgias, back pain, joint pain and neck pain.  Skin: Negative.  Negative for itching and rash.  Neurological: Positive for dizziness. Negative for tingling, tremors, sensory change, seizures, loss of consciousness  and headaches.       Lightheaded and dizzy and unsteady on his feet.  Endo/Heme/Allergies: Negative.  Negative for environmental allergies. Does not bruise/bleed easily.    Blood pressure 151/99, pulse 142, temperature 98.3 F (36.8 C), temperature source Oral, resp. rate 21, height 6' (1.829 m), weight 97.523 kg (215 lb), SpO2 96 %.Body mass index is 29.15 kg/(m^2).  General Appearance: Casual  Eye Contact::  Fair  Speech:  Slow  Volume:  Decreased  Mood:  "I don't feel good"  Affect:  Flat  Thought Process:  Linear  Orientation:  Full (Time, Place, and Person)  Thought Content:  Negative  Suicidal Thoughts:  No  Homicidal Thoughts:  No  Memory:  Immediate;   Fair Recent;   Fair Remote;   Fair  Judgement:  Fair  Insight:  Fair  Psychomotor Activity:  Decreased  Concentration:  Fair  Recall:  [employer] of Knowledge:Fair  Language: Fair  Akathisia:  No  Handed:  Right  AIMS (if indicated):     Assets:  Desire for Improvement Financial Resources/Insurance Housing Intimacy Social Support  ADL's:  Intact  Cognition: Impaired,  Mild  Sleep:  Number of Hours: 5.5     Diagnosis Bipolar disorder, type I, severe: MRE Depressed Type 2 diabetes on insulin Hypertension CHF Chronic kidney disease stage III Gout Moderate to severe: Chronic mental illness   ASSESSMENT AND TREATMENT PLAN:  Christian [name] is a 64 year old African-American male with history of bipolar disorder with severe depression he was admitted to the hospital and is now undergoing ECT treatment.  Bipolar disorder, type I, most recent episode depressed: Currently undergoing ECT treatment. He has tolerated treatment fairly well and mood is slowly been improving. The patient denies any current active or passive suicidal thoughts. Plan to continue the patient on Remeron 45 mg by mouth nightly for depression and Zyprexa 30 mg by mouth nightly for psychosis. Will await medicine consult before proceeding with  ECT  Hypertension, CHF: Patient has a heart rate > 140 and is complaining of lightheadedness and dizziness. BP is elevated as well. Will check orthostatic VS, CMP and BMP. Will get medicine consult. Will continue Norvasc 10 mg by mouth daily, Lisinopril 10 mg by mouth daily, Lasix 20 mg by mouth daily, diltiazem 120 mg by mouth daily and hydralazine 25 mg by mouth 4 times a day. He is also on aspirin 81 mg by mouth daily. Due to fall risk, sitter at bedside  Diabetes, insulin-dependent: Spoke with hospitalist on-call and Insulin increased to 15 units at bedtime. He is also now on a sliding scale insulin. Will continue with diabetic diet.  Hyperlipidemia: We'll continue Lipitor 10 mg by mouth nightly  Gout: Will continue allopurinol daily  BPH: We'll continue the max 0.4 mg by mouth nightly.  Disposition: The patient will follow up with Dr. Emerson Monte. He does have a stable living situation with his wife.    Arhianna Ebey KAMAL 08/31/2015, 11:01 AM

## 2015-08-31 NOTE — [facility] Suicide Risk Assessment (Signed)
[facility] Admission Suicide Risk Assessment   Nursing information obtained from:   Chart and speaking to nursing Demographic factors:   64 y/o married African American Male Current Mental Status:   Depressed Loss Factors:   Multiple chronic medical problems Historical Factors:   Multiple admissions for depression Risk Reduction Factors:   Compliance with treatment Total Time spent with patient: 30 minutes Principal Problem: Bipolar disorder, current episode depressed, severe, with psychotic features (HCC) Diagnosis:   Patient Active Problem List   Diagnosis Date Noted  . Affective psychosis, bipolar (HCC) [F31.9]   . Bipolar disorder, now depressed (HCC) [F31.30] 08/28/2015  . Bipolar I disorder, severe, current or most recent episode depressed, with psychotic features (HCC) [F31.5]   . Pressure ulcer [L89.90] 07/30/2015  . Protein-calorie malnutrition, severe (HCC) [E43] 07/30/2015  . Dehydration [E86.0] 07/29/2015  . Bipolar disorder, current episode depressed, severe, with psychotic features (HCC) [F31.5] 07/09/2015  . Diabetes (HCC) [E11.9] 07/09/2015  . UTI (urinary tract infection) [N39.0] 06/19/2014  . Positive blood culture [R78.81] 06/19/2014  . Bipolar affective disorder, current episode manic with psychotic symptoms (HCC) [F31.2] 06/17/2014  . Type II or unspecified type diabetes mellitus with unspecified complication, uncontrolled [E11.8, E11.65] 06/02/2014  . Other and unspecified hyperlipidemia [E78.5] 06/02/2014  . OSA on CPAP [G47.33] 06/02/2014  . 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 Gout [M10.9] 06/02/2014  . Hypertension [I10]      Continued Clinical Symptoms:  Alcohol Use Disorder Identification Test Final Score (AUDIT): 0 The "Alcohol Use Disorders Identification Test", Guidelines for Use in Primary Care, Second Edition.  World Science writer [facility]). Score between 0-7:  no or low risk or alcohol related  problems. Score between 8-15:  moderate risk of alcohol related problems. Score between 16-19:  high risk of alcohol related problems. Score 20 or above:  warrants further diagnostic evaluation for alcohol dependence and treatment.   CLINICAL FACTORS:   Depression:   Anhedonia Hopelessness Severe   Musculoskeletal: Strength & Muscle Tone: within normal limits Gait & Station: unsteady Patient leans: N/A  Psychiatric Specialty Exam: Physical Exam  Constitutional: He is oriented to person, place, and time. He appears well-developed and well-nourished. No distress.  HENT:  Head: Normocephalic and atraumatic.  Eyes: Conjunctivae and EOM are normal. Pupils are equal, round, and reactive to light. No scleral icterus.  Neck: Normal range of motion. Neck supple. No tracheal deviation present. No thyromegaly present.  Cardiovascular: Normal rate, regular rhythm and normal heart sounds.   Respiratory: Effort normal and breath sounds normal. No respiratory distress. He has no wheezes. He exhibits no tenderness.  GI: Soft. Bowel sounds are normal. He exhibits no distension. There is no tenderness. There is no guarding.  Musculoskeletal: Normal range of motion. He exhibits no edema or tenderness.  Lymphadenopathy:    He has no cervical adenopathy.  Neurological: He is alert and oriented to person, place, and time. He has normal reflexes. No cranial nerve deficit. Coordination normal.  Unsteady on his feet  Skin: He is not diaphoretic.    Review of Systems  Constitutional: Positive for malaise/fatigue. Negative for fever, chills, weight loss and diaphoresis.  HENT:       The patient complains of feeling weak and dizzy.  Eyes: Negative for blurred vision, double vision, photophobia and pain.  Respiratory: Negative for cough, shortness of breath and wheezing.   Cardiovascular: Negative for chest pain, palpitations, orthopnea and leg swelling.  Gastrointestinal: Positive for vomiting. Negative  for heartburn, nausea, abdominal pain, diarrhea and constipation.       He vomitted last night once but no nausea today.  Genitourinary: Negative for dysuria, urgency and frequency.  Musculoskeletal: Negative for myalgias, back pain, joint pain and neck pain.  Skin: Negative for itching and rash.  Neurological: Positive for dizziness and weakness. Negative for tremors, sensory change, focal weakness and loss of consciousness.       He is unsteady on his feet and complaining of lightheadedness and dizziness.  Endo/Heme/Allergies: Does not bruise/bleed easily.    Blood pressure 151/99, pulse 142, temperature 98.3 F (36.8 C), temperature source Oral, resp. rate 21, height 6' (1.829 m), weight 97.523 kg (215 lb), SpO2 96 %.Body mass index is 29.15 kg/(m^2).  General Appearance: Casual  Eye Contact::  Good  Speech:  Clear and Coherent, Normal Rate and Slow  Volume:  Normal  Mood:  Depressed  Affect:  Blunt  Thought Process:  Goal Directed and Logical  Orientation:  Full (Time, Place, and Person)  Thought Content:  Negative  Suicidal Thoughts:  No  Homicidal Thoughts:  No  Memory:  Immediate;   Fair Recent;   Fair Remote;   Fair  Judgement:  Fair  Insight:  Fair  Psychomotor Activity:  Decreased  Concentration:  Fair  Recall:  [employer] of Knowledge:Fair  Language: Fair  Akathisia:  Negative  Handed:  Right  AIMS (if indicated):     Assets:  Housing Social Support  Sleep:  Number of Hours: 5.5  Cognition: WNL  ADL's:  Intact     COGNITIVE FEATURES THAT CONTRIBUTE TO RISK:  None    SUICIDE RISK:   Minimal: No identifiable suicidal ideation.  Patients presenting with no risk factors but with morbid ruminations; may be classified as minimal risk based on the severity of the depressive symptoms  PLAN OF CARE:   Diagnosis Bipolar disorder, type I, severe: MRE Depressed Type 2 diabetes on insulin Hypertension CHF Chronic kidney disease stage III Gout Moderate to  severe: Chronic mental illness   ASSESSMENT AND TREATMENT PLAN:  Christian [name] is a 64 year old African-American male with history of bipolar disorder with severe depression he was admitted to the hospital and is now undergoing ECT treatment.  Bipolar disorder, type I, most recent episode depressed: Currently undergoing ECT treatment. He has tolerated treatment fairly well and mood is slowly been improving. The patient denies any current active or passive suicidal thoughts. Plan to continue the patient on Remeron 45 mg by mouth nightly for depression and Zyprexa 30 mg by mouth nightly for psychosis. Will await medicine consult before proceeding with ECT  Hypertension, CHF: Patient has a heart rate > 140 and is complaining of lightheadedness and dizziness.  Will continue Norvasc 10 mg by mouth daily, Lisinopril 10 mg by mouth daily, Lasix 20 mg by mouth daily, diltiazem 120 mg by mouth daily and hydralazine 25 mg by mouth 4 times a day. He is also on aspirin 81 mg by mouth daily. Due to fall risk, sitter at bedside  Diabetes, insulin-dependent: Spoke with hospitalist on-call and Insulin increased to 15 units at bedtime. He is also now on a sliding scale insulin. Will continue with diabetic diet.  Hyperlipidemia: We'll continue Lipitor 10 mg by mouth nightly  Gout: Will continue allopurinol daily  BPH: We'll continue the max 0.4 mg by mouth nightly.  Disposition: Per medicine service, Dr Letitia Libra, the patient will be transferred to the medical floor for telemetry.   Medical Decision  Making:  New problem, with additional work up planned, Review or order clinical lab tests (1) and Review or order medicine tests (1)  I certify that inpatient services furnished can reasonably be expected to improve the patient's condition.   Jasey Cortez KAMAL 08/31/2015, 1:54 PM

## 2015-08-31 NOTE — [facility] Counselor (Signed)
[facility] LCSW Group Therapy  08/31/2015 2:40 PM  Type of Therapy:  Group Therapy  Participation Level:  Did Not Attend  Modes of Intervention:  Discussion, Education, Socialization and Support  Summary of Progress/Problems: Pt will identify unhealthy thoughts and how they impact their emotions and behavior. Pt will be encouraged to discuss these thoughts, emotions and behaviors with the group.    Evalin Shawhan L Kyannah Climer MSW, LCSWA  08/31/2015, 2:40 PM 

## 2015-08-31 NOTE — Progress Notes (Addendum)
Patient ID: Christian [name], male   DOB: 15-Dec-1950, 65 y.o.   MRN: [id number] Pt discharged from behavior med to be admitted to the medical floor( telemetry) .

## 2015-08-31 NOTE — Progress Notes (Signed)
Per MD Letitia Libra, get a PICC line for patient

## 2015-08-31 NOTE — Progress Notes (Signed)
Critical lab called, tropinin 0.06, Dr Hilton Sinclair aware. No new orders. Dr. Hilton Sinclair notified for the PICC line placement order R/T to limited vascular access. Will continue to monitor pt.

## 2015-08-31 NOTE — Progress Notes (Signed)
Patient ID: Christian [name], male   DOB: [birth year], 64 y.o.   MRN: [id number]   Patient seen in consultation and evaluated. Suspect patient is having paroxysmal atrial fib with rapid ventricular response and is symptomatic. Had ekg showing afib on Aug 12. Today's ekg shows sinus tach. Unfortunantle cannot monitor patient in behavioral health unit to properly evaluate. Will transfer to monitored bed for obs. Please see admission H &P for further details.

## 2015-08-31 NOTE — Progress Notes (Signed)
During shift assessment it was noted pt. Has limited mobility in both upper extremities, he states it is rotator cuff injuries.

## 2015-09-01 ENCOUNTER — Observation Stay
Admit: 2015-09-01 | Discharge: 2015-09-01 | Disposition: A | Discharge status: Home / Self Care | Payer: Commercial Managed Care - HMO | Source: Ambulatory Visit | Attending: Internal Medicine | Admitting: Internal Medicine

## 2015-09-01 DIAGNOSIS — R55 Syncope and collapse: Secondary | ICD-10-CM | POA: Diagnosis not present

## 2015-09-01 DIAGNOSIS — F315 Bipolar disorder, current episode depressed, severe, with psychotic features: Secondary | ICD-10-CM

## 2015-09-01 DIAGNOSIS — R400 Somnolence: Secondary | ICD-10-CM | POA: Diagnosis not present

## 2015-09-01 DIAGNOSIS — J9601 Acute respiratory failure with hypoxia: Secondary | ICD-10-CM | POA: Diagnosis not present

## 2015-09-01 DIAGNOSIS — R7989 Other specified abnormal findings of blood chemistry: Secondary | ICD-10-CM | POA: Diagnosis not present

## 2015-09-01 DIAGNOSIS — I951 Orthostatic hypotension: Secondary | ICD-10-CM | POA: Diagnosis not present

## 2015-09-01 LAB — GLUCOSE, CAPILLARY
Glucose-Capillary: 126 mg/dL — ABNORMAL HIGH (ref 65–99)
Glucose-Capillary: 135 mg/dL — ABNORMAL HIGH (ref 65–99)
Glucose-Capillary: 124 mg/dL — ABNORMAL HIGH (ref 65–99)
Glucose-Capillary: 213 mg/dL — ABNORMAL HIGH (ref 65–99)

## 2015-09-01 LAB — TROPONIN I: Troponin I: 0.03 ng/mL (ref <0.031)

## 2015-09-01 LAB — BLOOD GAS, ARTERIAL
Acid-base deficit: 0.3 mmol/L (ref 0.0–2.0)
Allens test (pass/fail): POSITIVE — AB
Bicarbonate: 21.5 mEq/L (ref 21.0–28.0)
FIO2: 0.21
O2 Saturation: 98.2 %
Patient temperature: 37
pCO2 arterial: 27 mmHg — ABNORMAL LOW (ref 32.0–48.0)
pH, Arterial: 7.51 — ABNORMAL HIGH (ref 7.350–7.450)
pO2, Arterial: 97 mmHg (ref 83.0–108.0)

## 2015-09-01 LAB — FIBRIN DERIVATIVES D-DIMER (ARMC ONLY): Fibrin derivatives D-dimer (ARMC): 5678 — ABNORMAL HIGH (ref 0–499)

## 2015-09-01 LAB — HEMOGLOBIN A1C: Hgb A1c MFr Bld: 7.5 % — ABNORMAL HIGH (ref 4.0–6.0)

## 2015-09-01 MED ORDER — SODIUM CHLORIDE 0.9 % IV SOLN: 250.0000 mL | Freq: Once | INTRAVENOUS | Status: DC

## 2015-09-01 MED ORDER — ENOXAPARIN SODIUM 40 MG/0.4ML ~~LOC~~ SOLN
40.0000 mg | SUBCUTANEOUS | Status: DC
  Administered 2015-09-01: 40 mg via SUBCUTANEOUS
  Filled 2015-09-01: qty 0.4

## 2015-09-01 MED ORDER — INSULIN ASPART 100 UNIT/ML ~~LOC~~ SOLN
3.0000 [IU] | Freq: Three times a day (TID) | SUBCUTANEOUS | Status: DC
  Administered 2015-09-01 – 2015-09-02 (×3): 3 [IU] via SUBCUTANEOUS
  Filled 2015-09-01 (×3): qty 3

## 2015-09-01 MED ORDER — INSULIN ASPART 100 UNIT/ML ~~LOC~~ SOLN
0.0000 [IU] | Freq: Three times a day (TID) | SUBCUTANEOUS | Status: DC
  Administered 2015-09-01 (×2): 1 [IU] via SUBCUTANEOUS
  Administered 2015-09-02 – 2015-09-03 (×3): 2 [IU] via SUBCUTANEOUS
  Administered 2015-09-03: 3 [IU] via SUBCUTANEOUS
  Administered 2015-09-04: 1 [IU] via SUBCUTANEOUS
  Administered 2015-09-04: 2 [IU] via SUBCUTANEOUS
  Administered 2015-09-05 (×3): 1 [IU] via SUBCUTANEOUS
  Filled 2015-09-01 (×3): qty 2
  Filled 2015-09-01: qty 1
  Filled 2015-09-01: qty 2
  Filled 2015-09-01 (×2): qty 1
  Filled 2015-09-01: qty 6
  Filled 2015-09-01: qty 3
  Filled 2015-09-01: qty 1

## 2015-09-01 MED ORDER — SODIUM CHLORIDE 0.9 % IV SOLN
INTRAVENOUS | Status: DC
  Administered 2015-09-01 – 2015-09-02 (×2): via INTRAVENOUS

## 2015-09-01 MED ORDER — MIRTAZAPINE 15 MG PO TABS
45.0000 mg | ORAL_TABLET | Freq: Every day | ORAL | Status: DC
  Administered 2015-09-01 – 2015-09-04 (×4): 45 mg via ORAL
  Filled 2015-09-01 (×4): qty 3

## 2015-09-01 MED ORDER — INSULIN GLARGINE 100 UNIT/ML ~~LOC~~ SOLN
14.0000 [IU] | Freq: Every day | SUBCUTANEOUS | Status: DC
  Administered 2015-09-01 – 2015-09-03 (×3): 14 [IU] via SUBCUTANEOUS
  Filled 2015-09-01 (×4): qty 0.14

## 2015-09-01 NOTE — Progress Notes (Signed)
Inpatient Diabetes Program Recommendations  AACE/ADA: New Consensus Statement on Inpatient Glycemic Control (2015)  Target Ranges:  Prepandial:   less than 140 mg/dL      Peak postprandial:   less than 180 mg/dL (1-2 hours)      Critically ill patients:  140 - 180 mg/dL  Results for Christian [name], Christian [name] (MRN [id number]) as of 09/01/2015 10:52  Ref. Range 08/31/2015 06:52 08/31/2015 11:56 08/31/2015 16:23 08/31/2015 22:07 09/01/2015 09:03  Glucose-Capillary Latest Ref Range: 65-99 mg/dL 201 (H) 007 (H) 121 (H) 152 (H) 126 (H)   Review of Glycemic Control  Current orders for Inpatient glycemic control: Lantus 14 units QHS, Novolog 0-9 units TID with meals, Novolog 3 units TID with meals for meal coverage  Inpatient Diabetes Program Recommendations: Insulin - Meal Coverage: Please consider increasing meal coverage to Novolog 5 units TID with meals (if patient eats at least 50% of meal). HgbA1C: A1C in process.  Thanks, Orlando Penner, RN, MSN, CCRN, CDE Diabetes Coordinator Inpatient Diabetes Program [phone] (Team Pager from 8am to 5pm) [phone] ([facility] office) [phone] [facility] office) [phone] [facility] office)

## 2015-09-01 NOTE — Progress Notes (Addendum)
[facility] - [place] at [facility]   PATIENT NAME: Christian [name]    MR#:  [id number]  DATE OF BIRTH:  1951/03/03  SUBJECTIVE: I feel pretty good CHIEF COMPLAINT:  No chief complaint on file.  Patient is 64 year old African-American male with history of depression and resultant anorexia, weight loss, malnutrition who was admitted to behavioral health unit on 08/29/2015 developed dizziness, lightheadedness and was found to have orthostatic hypotension and was admitted to the medical unit. He denies any significant discomfort, no pain . Orthostatic vital signs revealed a significant drop of blood pressure from 145 systolic to 84. Patient was not initiated on IV fluids yet. He was also noted to be somnolent, getting down to sleep while he is discussing medical issues with attending his oxygen saturations were found to be low at 75% on room air at rest intermittently . First troponin was found to be elevated 0.06. Patient denies any chest pains. Blood glucose levels are between 120s to 200s    Review of Systems  Constitutional: Negative for fever, chills and weight loss.  HENT: Negative for congestion.   Eyes: Negative for blurred vision and double vision.  Respiratory: Positive for cough and shortness of breath. Negative for sputum production and wheezing.   Cardiovascular: Negative for chest pain, palpitations, orthopnea, leg swelling and PND.  Gastrointestinal: Negative for nausea, vomiting, abdominal pain, diarrhea, constipation and blood in stool.  Genitourinary: Negative for dysuria, urgency, frequency and hematuria.  Musculoskeletal: Negative for falls.  Neurological: Negative for dizziness, tremors, focal weakness and headaches.  Endo/Heme/Allergies: Does not bruise/bleed easily.  Psychiatric/Behavioral: Negative for depression. The patient does not have insomnia.     VITAL SIGNS: Blood pressure 84/57, pulse 127, temperature 98.8 F (37.1 C), temperature  source Oral, resp. rate 18, height 6' (1.829 m), weight 97.841 kg (215 lb 11.2 oz), SpO2 100 %.  PHYSICAL EXAMINATION:   GENERAL:  64 y.o.-year-old patient lying in the bed with no acute distress.  EYES: Pupils equal, round, reactive to light and accommodation. No scleral icterus. Extraocular muscles intact.  HEENT: Head atraumatic, normocephalic. Oropharynx and nasopharynx clear.  NECK:  Supple, no jugular venous distention. No thyroid enlargement, no tenderness.  LUNGS: Normal breath sounds bilaterally, no wheezing, rales,rhonchi or crepitation. No use of accessory muscles of respiration.  CARDIOVASCULAR: S1, S2 normal. No murmurs, rubs, or gallops.  ABDOMEN: Soft, nontender, nondistended. Bowel sounds present. No organomegaly or mass.  EXTREMITIES: No pedal edema, cyanosis, or clubbing.  NEUROLOGIC: Cranial nerves II through XII are intact. Muscle strength 5/5 in all extremities. Sensation intact. Gait not checked.  PSYCHIATRIC: The patient is alert and oriented x 3.  SKIN: No obvious rash, lesion, or ulcer.   ORDERS/RESULTS REVIEWED:   CBC  Recent Labs Lab 08/31/15 1132  WBC 5.0  HGB 12.8*  HCT 38.9*  PLT 172  MCV 90.3  MCH 29.8  MCHC 33.0  RDW 13.9  LYMPHSABS 1.1  MONOABS 0.5  EOSABS 0.1  BASOSABS 0.0   ------------------------------------------------------------------------------------------------------------------  Chemistries   Recent Labs Lab 08/26/15 0516 08/31/15 1132  NA  --  138  K  --  4.4  CL  --  104  CO2  --  27  GLUCOSE  --  208*  BUN  --  21*  CREATININE 0.91 1.01  CALCIUM  --  9.3  AST  --  15  ALT  --  20  ALKPHOS  --  62  BILITOT  --  0.8   ------------------------------------------------------------------------------------------------------------------ estimated  creatinine clearance is 91.9 mL/min (by C-G formula based on Cr of  1.01). ------------------------------------------------------------------------------------------------------------------ No results for input(s): TSH, T4TOTAL, T3FREE, THYROIDAB in the last 72 hours.  Invalid input(s): FREET3  Cardiac Enzymes  Recent Labs Lab 08/31/15 1804 09/01/15 0500  TROPONINI 0.06* 0.03   ------------------------------------------------------------------------------------------------------------------ Invalid input(s): POCBNP ---------------------------------------------------------------------------------------------------------------  RADIOLOGY: Dg Chest Port 1 View  08/31/2015   CLINICAL DATA:  PICC line placement  EXAM: PORTABLE CHEST 1 VIEW  COMPARISON:  07/29/2015  FINDINGS: The right upper extremity PICC line extends to the SVC with tip at the level of the azygos vein junction. The lungs are clear. Mediastinal and hilar contours are unremarkable.  IMPRESSION: Satisfactorily positioned right upper extremity PICC line. No acute cardiopulmonary findings.   Electronically Signed   By: Ellery Plunk M.D.   On: 08/31/2015 21:43    EKG:  Orders placed or performed during the hospital encounter of 08/27/15  . EKG 12-Lead  . EKG 12-Lead    ASSESSMENT AND PLAN:  Active Problems:   Syncope, near 1. Near syncope, likely due to orthostatic hypotension, initiate patient on IV fluids. Get carotid ultrasound, echocardiogram was done in August 2015 only, so we will repeat it as well, as patient had elevated troponin. Patient will benefit from cardiac evaluation 2. Orthostatic hypotension as above. Will initiate IV fluids and follow patient's orthostatic vital signs closely. Etiology is likely diabetes as well as oral intake 3. Elevated troponin. Will get echocardiogram, get  cardiac evaluation such as stress test tomorrow 4. Diabetes mellitus. Continue outpatient medications. Advance insulin Lantus and add preprandial short-acting insulin 5. Somnolence, unclear  etiology, rule out obstructive Sleep apnea. Get ABGs . As patient is sedentary. We may need also to CT his chest to rule out pulmonary embolism. Getting d-dimer.    Management plans discussed with the patient, family and they are in agreement.   DRUG ALLERGIES: No Known Allergies  CODE STATUS:     Code Status Orders        Start     Ordered   08/31/15 1703  Full code   Continuous     08/31/15 1702      TOTAL TIME TAKING CARE OF THIS PATIENT: 45 minutes.    Katharina Caper M.D on 09/01/2015 at 12:23 PM  Between 7am to 6pm - Pager - [phone]  After 6pm go to [email or web address] - password [email or web address] [facility]  [facility]  Office  [phone]  CC: Primary care physician; Leanor Rubenstein, MD

## 2015-09-01 NOTE — Progress Notes (Signed)
*  PRELIMINARY RESULTS* Echocardiogram 2D Echocardiogram has been performed.  Christian [name] 09/01/2015, 4:41 PM

## 2015-09-01 NOTE — Consult Note (Signed)
  Psychiatry: Follow-up for this 64 year old man with a history of bipolar disorder, severe with psychotic depression. Patient was receiving ECT treatment. He had been on the medical service for several weeks and recently been transferred back down to behavioral health because of improvements in his condition. Over the weekend, he was found to have atrial fibrillation with labile vital signs and was transferred to the internal medicine service.  I came by to see the patient this afternoon, but he is still at the echocardiogram lab. If he comes back room before I leave. I will check in on him. Vitals evaluated. Labs evaluated. Read internist's notes.  Unfortunately to have this interruption as the patient was continuing to finally get some improvement with bilateral ECT treatment, which she was otherwise tolerating well. Until his acute medical conditions can be stabilized, we will defer any further ECT treatment.  Last time I saw him. The patient's mood was continuing to improve, although his affect was still blunted. He was denying psychotic symptoms and paranoia to me, although there had recently still been some hints of continued symptomatology.  I recommend continuing his antidepressive and antipsychotic medications at this time. I will continue to follow up in check by tomorrow.  Diagnosis bipolar disorder, severe, depressed with psychotic features

## 2015-09-02 ENCOUNTER — Observation Stay: Payer: Commercial Managed Care - HMO

## 2015-09-02 ENCOUNTER — Encounter: Payer: Self-pay | Admitting: Physician Assistant

## 2015-09-02 DIAGNOSIS — E119 Type 2 diabetes mellitus without complications: Secondary | ICD-10-CM | POA: Diagnosis present

## 2015-09-02 DIAGNOSIS — R627 Adult failure to thrive: Secondary | ICD-10-CM | POA: Diagnosis present

## 2015-09-02 DIAGNOSIS — E785 Hyperlipidemia, unspecified: Secondary | ICD-10-CM | POA: Diagnosis present

## 2015-09-02 DIAGNOSIS — R0600 Dyspnea, unspecified: Secondary | ICD-10-CM | POA: Diagnosis not present

## 2015-09-02 DIAGNOSIS — R55 Syncope and collapse: Secondary | ICD-10-CM

## 2015-09-02 DIAGNOSIS — I2699 Other pulmonary embolism without acute cor pulmonale: Secondary | ICD-10-CM | POA: Diagnosis present

## 2015-09-02 DIAGNOSIS — I2510 Atherosclerotic heart disease of native coronary artery without angina pectoris: Secondary | ICD-10-CM | POA: Diagnosis present

## 2015-09-02 DIAGNOSIS — J9601 Acute respiratory failure with hypoxia: Secondary | ICD-10-CM | POA: Diagnosis present

## 2015-09-02 DIAGNOSIS — J189 Pneumonia, unspecified organism: Secondary | ICD-10-CM | POA: Diagnosis present

## 2015-09-02 DIAGNOSIS — I951 Orthostatic hypotension: Secondary | ICD-10-CM | POA: Diagnosis present

## 2015-09-02 DIAGNOSIS — M109 Gout, unspecified: Secondary | ICD-10-CM | POA: Diagnosis present

## 2015-09-02 DIAGNOSIS — F315 Bipolar disorder, current episode depressed, severe, with psychotic features: Secondary | ICD-10-CM | POA: Diagnosis not present

## 2015-09-02 DIAGNOSIS — F3175 Bipolar disorder, in partial remission, most recent episode depressed: Secondary | ICD-10-CM

## 2015-09-02 DIAGNOSIS — I10 Essential (primary) hypertension: Secondary | ICD-10-CM | POA: Diagnosis not present

## 2015-09-02 DIAGNOSIS — I6522 Occlusion and stenosis of left carotid artery: Secondary | ICD-10-CM | POA: Diagnosis not present

## 2015-09-02 DIAGNOSIS — E860 Dehydration: Secondary | ICD-10-CM | POA: Diagnosis present

## 2015-09-02 DIAGNOSIS — Z794 Long term (current) use of insulin: Secondary | ICD-10-CM | POA: Diagnosis not present

## 2015-09-02 DIAGNOSIS — F333 Major depressive disorder, recurrent, severe with psychotic symptoms: Secondary | ICD-10-CM | POA: Diagnosis present

## 2015-09-02 DIAGNOSIS — G4733 Obstructive sleep apnea (adult) (pediatric): Secondary | ICD-10-CM | POA: Diagnosis present

## 2015-09-02 DIAGNOSIS — I878 Other specified disorders of veins: Secondary | ICD-10-CM | POA: Diagnosis present

## 2015-09-02 DIAGNOSIS — I82413 Acute embolism and thrombosis of femoral vein, bilateral: Secondary | ICD-10-CM | POA: Diagnosis present

## 2015-09-02 DIAGNOSIS — I2609 Other pulmonary embolism with acute cor pulmonale: Principal | ICD-10-CM

## 2015-09-02 DIAGNOSIS — N183 Chronic kidney disease, stage 3 (moderate): Secondary | ICD-10-CM | POA: Diagnosis present

## 2015-09-02 DIAGNOSIS — I248 Other forms of acute ischemic heart disease: Secondary | ICD-10-CM | POA: Diagnosis present

## 2015-09-02 DIAGNOSIS — I509 Heart failure, unspecified: Secondary | ICD-10-CM | POA: Diagnosis present

## 2015-09-02 DIAGNOSIS — I4891 Unspecified atrial fibrillation: Secondary | ICD-10-CM | POA: Diagnosis present

## 2015-09-02 DIAGNOSIS — Z7982 Long term (current) use of aspirin: Secondary | ICD-10-CM | POA: Diagnosis not present

## 2015-09-02 DIAGNOSIS — R0989 Other specified symptoms and signs involving the circulatory and respiratory systems: Secondary | ICD-10-CM

## 2015-09-02 DIAGNOSIS — I130 Hypertensive heart and chronic kidney disease with heart failure and stage 1 through stage 4 chronic kidney disease, or unspecified chronic kidney disease: Secondary | ICD-10-CM | POA: Diagnosis present

## 2015-09-02 DIAGNOSIS — R911 Solitary pulmonary nodule: Secondary | ICD-10-CM | POA: Diagnosis present

## 2015-09-02 LAB — GLUCOSE, CAPILLARY
Glucose-Capillary: 179 mg/dL — ABNORMAL HIGH (ref 65–99)
Glucose-Capillary: 196 mg/dL — ABNORMAL HIGH (ref 65–99)
Glucose-Capillary: 108 mg/dL — ABNORMAL HIGH (ref 65–99)
Glucose-Capillary: 204 mg/dL — ABNORMAL HIGH (ref 65–99)

## 2015-09-02 MED ORDER — IOHEXOL 350 MG/ML SOLN
100.0000 mL | Freq: Once | INTRAVENOUS | Status: AC | PRN
  Administered 2015-09-02: 100 mL via INTRAVENOUS

## 2015-09-02 MED ORDER — APIXABAN 5 MG PO TABS: 5.0000 mg | ORAL_TABLET | Freq: Two times a day (BID) | ORAL | Status: DC

## 2015-09-02 MED ORDER — INSULIN ASPART 100 UNIT/ML ~~LOC~~ SOLN
5.0000 [IU] | Freq: Three times a day (TID) | SUBCUTANEOUS | Status: DC
  Administered 2015-09-02 – 2015-09-05 (×10): 5 [IU] via SUBCUTANEOUS
  Filled 2015-09-02 (×9): qty 5

## 2015-09-02 MED ORDER — APIXABAN 5 MG PO TABS
10.0000 mg | ORAL_TABLET | Freq: Two times a day (BID) | ORAL | Status: DC
  Administered 2015-09-02 – 2015-09-05 (×7): 10 mg via ORAL
  Filled 2015-09-02 (×7): qty 2

## 2015-09-02 NOTE — Progress Notes (Signed)
ANTICOAGULATION CONSULT NOTE - Initial Consult  Pharmacy Consult for Eliquis (apixaban) Indication: pulmonary embolus  No Known Allergies  Patient Measurements: Height: 6' (182.9 cm) Weight: 215 lb 11.2 oz (97.841 kg) IBW/kg (Calculated) : 77.6  Vital Signs: Temp: 97.9 F (36.6 C) (10/04 0822) Temp Source: Oral (10/04 0822) BP: 157/100 mmHg (10/04 0822) Pulse Rate: 103 (10/04 0822)  Labs:  Recent Labs  08/31/15 1132 08/31/15 1804 09/01/15 0500  HGB 12.8*  --   --   HCT 38.9*  --   --   PLT 172  --   --   CREATININE 1.01  --   --   TROPONINI  --  0.06* 0.03    Estimated Creatinine Clearance: 91.9 mL/min (by C-G formula based on Cr of 1.01).   Medical History: Past Medical History  Diagnosis Date  . Mood swings (HCC)   . Type II or unspecified type diabetes mellitus with unspecified complication, uncontrolled 06/02/2014  . Other and unspecified hyperlipidemia 06/02/2014  . OSA on CPAP 06/02/2014  . 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 Bipolar disorder (HCC)   . Hypertension   . Gout   . Bipolar 1 disorder (HCC)     Medications:  Scheduled:  . sodium chloride  250 mL Intravenous Once  . apixaban  10 mg Oral BID  . [START ON 09/09/2015] apixaban  5 mg Oral BID  . aspirin EC  81 mg Oral Daily  . atorvastatin  10 mg Oral Daily  . cholecalciferol  800 Units Oral Daily  . clonazePAM  1 mg Oral QHS  . diltiazem  120 mg Oral Daily  . docusate sodium  200 mg Oral BID  . feeding supplement (GLUCERNA SHAKE)  237 mL Oral TID WC  . finasteride  5 mg Oral Daily  . hydrALAZINE  25 mg Oral 4 times per day  . insulin aspart  0-9 Units Subcutaneous TID WC  . insulin aspart  5 Units Subcutaneous TID WC  . insulin glargine  14 Units Subcutaneous QHS  . lisinopril  10 mg Oral Daily  . megestrol  400 mg Oral Daily  . metoprolol  100 mg Oral BID  . mirtazapine  45 mg Oral QHS  . OLANZapine zydis  30 mg Oral QHS  . polyethylene glycol   17 g Oral Daily  . sodium chloride  10 mL Intravenous Q12H  . tamsulosin  0.4 mg Oral QHS    Assessment: Patient is a 64 yo male admitted for syncope and orthostatic hypotension.  D-dimer found to be elevated on 10/3 and CT showing bilateral PE on 10/4.  MD consulted pharmacy for apixaban dosing.   SCr (10/2): 1.01, Wt >60 kg, age: 89 years  Goal of Therapy:  Monitor CBC and renal function per policy   Plan:  Will initiate patient on Apixaban 10 mg PO BID x 7 days then start Apixaban 5 mg po BID thereafter (start date of 10/11).  Will order follow up SCr and CBC in AM per policy for monitoring.   Pharmacy will continue to follow.   Christian [name] G 09/02/2015,12:29 PM

## 2015-09-02 NOTE — Consult Note (Signed)
  Psychiatry: Follow-up for this 64 year old man with severe depression as part of bipolar disorder. Patient was getting ECT and was on the psychiatry service but was transferred upstairs because of atrial fibrillation.  Patient seen chart reviewed. Patient was awake alert and interactive. Made good eye contact. Psychomotor activity sluggish. Speech was understandable and while not enthusiastic was fairly normal for him. He states that he feels like his mood is "okay". He denies any suicidal thoughts. He did not appear to be paranoid or disorganized in his thinking. His insight seemed reasonably adequate. He appears to be cooperative with treatment.  ECT has been put on positives while his cardiac issue is resolved. Fortunately at this point he seems to be maintaining his benefits. I did increase his mirtazapine to 45 mg at night. Continue mirtazapine and Zyprexa. Supportive counseling with the patient. Reassured him that we will keep following up and try and make appropriate disposition. No further changes to orders tonight.  Diagnosis bipolar disorder severe with psychotic depression currently improving

## 2015-09-02 NOTE — Consult Note (Signed)
Cardiology Consultation Note  Patient ID: Christian [name], MRN: [id number], DOB/AGE: [id number] 62 y.o. Admit date: 08/31/2015   Date of Consult: 09/02/2015 Primary Physician: Leanor Rubenstein, MD Primary Cardiologist: New to [facility]  Chief Complaint: Passing out Reason for Consult: Syncope and isolated elevated troponin   HPI: 64 y.o. male with h/o DM2, HTN, HLD, Bipolar disorder, OSA on CPAP, and mood swings who was admitted to behavioral health on 9/30 with complaints of dizziness and lightheadedness upon standing. He was found to be orthostatic with BP dropping from 140's to 80's systolic. Cardiology is consulted for this as well as a minimally elevated, isolated troponin of 0.06 that returned to 0.03 upon repeating.   He denies any previous cardiac history. Has never seen a cardiologist before. No prior known echo, stress test, or cardiac catheterization. He has been hospitalized multiple times for ECT and failure to thrive. ECT therapy has been progressing well per behavioral health. Appetite remains depressed per patient. He reports episodes of exertional SOB at times that will come on if he ambulates for up to 30 minutes at a time. If he has the option of elevator vs stairs he would take the elevator over stairs as sometimes stairs give him SOB. Never with exertional chest pain. Weight has been slowly declining. Never a smoker,denies alcohol, and illicit drug abuse.   He has been experiencing orthostatic hypotension for the past 1 week upon standing. Never with chest pain, palpitations, SOB, diaphoresis, nausea, or vomiting prior to his symptoms. Symptoms will last approximately 30 seconds and self resolve. On 9/30 he experienced another episode prompting him to be admitted to behavorial health, leading to the above consults. No changes from his prior episodes.   Upon his arrival to [facility] he was found to he severely orthostatic with systolic BP drop from 145 to 84. He was started on IV fluids. O2  saturations were found to be in the mid 70's on room air intermittently. First troponin was found to be 0.06-->0.03. He denied any chest pain. HGB 12.8. SCr 1.01. D dimer was elevated a 5678, though no CTA chest or lower extremity doppler was ordered. CXR showed no acute cardiopulmomary findings. Echo is pending. BP has remained stable.    Past Medical History  Diagnosis Date  . Mood swings (HCC)   . Type II or unspecified type diabetes mellitus with unspecified complication, uncontrolled 06/02/2014  . Other and unspecified hyperlipidemia 06/02/2014  . OSA on CPAP 06/02/2014  . 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 Bipolar disorder (HCC)   . CHF (congestive heart failure) (HCC)   . CKD (chronic kidney disease), stage III   . Diabetes mellitus without complication (HCC)   . Hypertension   . Gout   . Bipolar 1 disorder (HCC)   . OSA on CPAP       Most Recent Cardiac Studies: Echo 07/01/2014:  Study Conclusions  - Left ventricle: The cavity size was normal. Wall thickness was increased in a pattern of moderate LVH. Systolic function was normal. The estimated ejection fraction was in the range of 55% to 60%. - Mitral valve: Calcified annulus. - Left atrium: The atrium was mildly dilated. - Right ventricle: The cavity size was mildly dilated. - Right atrium: The atrium was mildly dilated. - Atrial septum: No defect or patent foramen ovale was identified.   Surgical History: History reviewed. No pertinent past surgical history.   Home Meds: Prior to Admission medications   Medication Sig  Start Date End Date Taking? Authorizing Provider  allopurinol (ZYLOPRIM) 300 MG tablet Take 1 tablet (300 mg total) by mouth daily. For gout 07/15/14   Beau Fanny, FNP  amLODipine (NORVASC) 10 MG tablet Take 10 mg by mouth daily. 06/23/15   Historical Provider, MD  aspirin EC 81 MG tablet Take 81 mg by mouth daily.    Historical Provider, MD  atorvastatin  (LIPITOR) 10 MG tablet Take 1 tablet (10 mg total) by mouth daily. For high cholesterol 06/05/14   Sanjuana Kava, NP  cholecalciferol (VITAMIN D) 400 UNITS TABS tablet Take 800 Units by mouth daily.    Historical Provider, MD  clonazePAM (KLONOPIN) 1 MG tablet Take 1 tablet (1 mg total) by mouth at bedtime. 08/26/15   Auburn Bilberry, MD  colchicine 0.6 MG tablet Take 1 tablet (0.6 mg total) by mouth 2 (two) times daily. For Gout pain Patient taking differently: Take 0.6 mg by mouth daily. For Gout pain 06/05/14   Sanjuana Kava, NP  diltiazem (CARDIZEM CD) 120 MG 24 hr capsule Take 1 capsule (120 mg total) by mouth daily. 07/29/15   Jimmy Footman, MD  docusate sodium (COLACE) 100 MG capsule Take 2 capsules (200 mg total) by mouth 2 (two) times daily. 08/26/15   Auburn Bilberry, MD  feeding supplement, GLUCERNA SHAKE, (GLUCERNA SHAKE) LIQD Take 237 mLs by mouth 3 (three) times daily with meals. 07/29/15   Jimmy Footman, MD  finasteride (PROSCAR) 5 MG tablet Take 1 tablet (5 mg total) by mouth daily. 08/26/15   Auburn Bilberry, MD  furosemide (LASIX) 20 MG tablet Take 20 mg by mouth daily. 06/09/15   Historical Provider, MD  hydrALAZINE (APRESOLINE) 25 MG tablet Take 1 tablet (25 mg total) by mouth every 6 (six) hours. 07/29/15   Jimmy Footman, MD  insulin aspart (NOVOLOG) 100 UNIT/ML injection Inject 0-9 Units into the skin 3 (three) times daily with meals. 08/26/15   Auburn Bilberry, MD  insulin glargine (LANTUS) 100 UNIT/ML injection Inject 0.12 mLs (12 Units total) into the skin at bedtime. 08/26/15   Auburn Bilberry, MD  lisinopril (PRINIVIL,ZESTRIL) 10 MG tablet Take 1 tablet (10 mg total) by mouth daily. 08/26/15   Auburn Bilberry, MD  megestrol (MEGACE) 400 MG/10ML suspension Take 10 mLs (400 mg total) by mouth daily. 07/29/15   Jimmy Footman, MD  metoprolol (TOPROL-XL) 200 MG 24 hr tablet Take 1 tablet (200 mg total) by mouth daily. For high blood pressure Patient  taking differently: Take 100 mg by mouth 2 (two) times daily. For high blood pressure 06/05/14   Sanjuana Kava, NP  mirtazapine (REMERON) 30 MG tablet Take 30 mg by mouth at bedtime.    Historical Provider, MD  OLANZapine zydis (ZYPREXA) 15 MG disintegrating tablet Take 2 tablets (30 mg total) by mouth at bedtime. 08/26/15   Auburn Bilberry, MD  ondansetron (ZOFRAN) 4 MG tablet Take 1 tablet (4 mg total) by mouth every 6 (six) hours as needed for nausea. 08/26/15   Auburn Bilberry, MD  polyethylene glycol (MIRALAX / GLYCOLAX) packet Take 17 g by mouth daily. 08/26/15   Auburn Bilberry, MD  tamsulosin (FLOMAX) 0.4 MG CAPS capsule Take 1 capsule (0.4 mg total) by mouth at bedtime. 08/26/15   Auburn Bilberry, MD  traZODone (DESYREL) 50 MG tablet Take 0.5 tablets (25 mg total) by mouth 3 (three) times daily. Patient taking differently: Take 200 mg by mouth at bedtime.  07/29/15   Jimmy Footman, MD    Inpatient Medications:  .  sodium chloride  250 mL Intravenous Once  . aspirin EC  81 mg Oral Daily  . atorvastatin  10 mg Oral Daily  . cholecalciferol  800 Units Oral Daily  . clonazePAM  1 mg Oral QHS  . diltiazem  120 mg Oral Daily  . docusate sodium  200 mg Oral BID  . enoxaparin (LOVENOX) injection  40 mg Subcutaneous Q24H  . feeding supplement (GLUCERNA SHAKE)  237 mL Oral TID WC  . finasteride  5 mg Oral Daily  . hydrALAZINE  25 mg Oral 4 times per day  . insulin aspart  0-9 Units Subcutaneous TID WC  . insulin aspart  3 Units Subcutaneous TID WC  . insulin glargine  14 Units Subcutaneous QHS  . lisinopril  10 mg Oral Daily  . megestrol  400 mg Oral Daily  . metoprolol  100 mg Oral BID  . mirtazapine  45 mg Oral QHS  . OLANZapine zydis  30 mg Oral QHS  . polyethylene glycol  17 g Oral Daily  . sodium chloride  10 mL Intravenous Q12H  . tamsulosin  0.4 mg Oral QHS   . sodium chloride 50 mL/hr at 09/02/15 0550    Allergies: No Known Allergies  Social History   Social History    . Marital Status: Married    Spouse Name: N/A  . Number of Children: N/A  . Years of Education: N/A   Occupational History  . Not on file.   Social History Main Topics  . Smoking status: Never Smoker   . Smokeless tobacco: Not on file  . Alcohol Use: No  . Drug Use: No  . Sexual Activity: Not Currently   Other Topics Concern  . Not on file   Social History Narrative   *The patient has been married for over 30 years and is currently on disability. He lives with his wife in the [place] area and has one daughter.               Family History  Problem Relation Age of Onset  . Dementia Mother   . Arthritis Father   . Heart failure Neg Hx   . Kidney failure Neg Hx   . Cancer Neg Hx      Review of Systems: Review of Systems  Constitutional: Positive for weight loss and malaise/fatigue. Negative for fever, chills and diaphoresis.  HENT: Positive for congestion.   Eyes: Negative for discharge and redness.  Respiratory: Positive for cough, sputum production and shortness of breath. Negative for hemoptysis and wheezing.   Cardiovascular: Positive for palpitations. Negative for chest pain, orthopnea, claudication, leg swelling and PND.  Gastrointestinal: Negative for heartburn, nausea and vomiting.  Musculoskeletal: Negative for falls.  Skin: Negative for rash.  Neurological: Positive for weakness and headaches. Negative for sensory change, speech change and focal weakness.  Endo/Heme/Allergies: Does not bruise/bleed easily.  Psychiatric/Behavioral: Positive for depression. Negative for substance abuse. The patient is not nervous/anxious.      Labs:  Recent Labs  08/31/15 1804 09/01/15 0500  TROPONINI 0.06* 0.03   Lab Results  Component Value Date   WBC 5.0 08/31/2015   HGB 12.8* 08/31/2015   HCT 38.9* 08/31/2015   MCV 90.3 08/31/2015   PLT 172 08/31/2015    Recent Labs Lab 08/31/15 1132  NA 138  K 4.4  CL 104  CO2 27  BUN 21*  CREATININE 1.01  CALCIUM  9.3  PROT 6.3*  BILITOT 0.8  ALKPHOS 62  ALT 20  AST 15  GLUCOSE 208*   Lab Results  Component Value Date   CHOL  03/08/2011    99        ATP III CLASSIFICATION:  <200     mg/dL   Desirable  161-096  mg/dL   Borderline High  >=045    mg/dL   High          HDL 34* 03/08/2011   LDLCALC  03/08/2011    53        Total Cholesterol/HDL:CHD Risk Coronary Heart Disease Risk Table                     Men   Women  1/2 Average Risk   3.4   3.3  Average Risk       5.0   4.4  2 X Average Risk   9.6   7.1  3 X Average Risk  23.4   11.0        Use the calculated Patient Ratio above and the CHD Risk Table to determine the patient's CHD Risk.        ATP III CLASSIFICATION (LDL):  <100     mg/dL   Optimal  409-811  mg/dL   Near or Above                    Optimal  130-159  mg/dL   Borderline  914-782  mg/dL   High  >956     mg/dL   Very High   TRIG 62 21/30/8657   No results found for: DDIMER  Radiology/Studies:  Dg Chest Port 1 View  08/31/2015   CLINICAL DATA:  PICC line placement  EXAM: PORTABLE CHEST 1 VIEW  COMPARISON:  07/29/2015  FINDINGS: The right upper extremity PICC line extends to the SVC with tip at the level of the azygos vein junction. The lungs are clear. Mediastinal and hilar contours are unremarkable.  IMPRESSION: Satisfactorily positioned right upper extremity PICC line. No acute cardiopulmonary findings.   Electronically Signed   By: Ellery Plunk M.D.   On: 08/31/2015 21:43    EKG: sinus tachycardia, 103 bpm, left axis deviation, nonspecific st/t changes    Weights: Filed Weights   08/31/15 1717  Weight: 215 lb 11.2 oz (97.841 kg)     Physical Exam: Blood pressure 135/85, pulse 95, temperature 98.1 F (36.7 C), temperature source Oral, resp. rate 20, height 6' (1.829 m), weight 215 lb 11.2 oz (97.841 kg), SpO2 100 %. Body mass index is 29.25 kg/(m^2). General: Well developed, well nourished, in no acute distress. Head: Normocephalic, atraumatic,  sclera non-icteric, no xanthomas, nares are without discharge.  Neck: Negative for carotid bruits. JVD not elevated. Lungs: Clear bilaterally to auscultation without wheezes, rales, or rhonchi. Breathing is unlabored. Heart: RRR with S1 S2. No murmurs, rubs, or gallops appreciated. Abdomen: Soft, non-tender, non-distended with normoactive bowel sounds. No hepatomegaly. No rebound/guarding. No obvious abdominal masses. Msk:  Strength and tone appear normal for age. Extremities: No clubbing or cyanosis. No edema. Mild chronic woody appearance of bilateral lower extremities.  Distal pedal pulses are 2+ and equal bilaterally. Neuro: Alert and oriented X 3. No facial asymmetry. No focal deficit. Moves all extremities spontaneously. Psych:  Responds to questions appropriately with a normal affect.    Assessment and Plan:  64 y.o. male with h/o DM2, HTN, HLD, Bipolar disorder, OSA on CPAP, and mood swings who was admitted to behavioral health on 9/30 with complaints of  dizziness and lightheadedness upon standing. He was found to be orthostatic with BP dropping from 140's to 80's systolic. Cardiology is consulted for this as well as a minimally elevated, isolated troponin of 0.06 that returned to 0.03 upon repeating.  1. Orthostatic hypotension: -Improved with IV fluids -Norvasc and Lasix have been held since admission with resolution of symptoms  -Carotid ultrasound and echo pending -May need to discuss Flomax with PCP -Likely in the setting of his poor PO intake, failure to thrive, and multiple admissions   2. Elevated troponin: -Likely demand ischemia in the setting of hypotension with systolic BP of 85 -Minimally elevated as above at 0.06-->0.03 -Asymptomatic  -Could consider outpatient nuclear stress test if develops symptoms  3. HTN: -Stable, continue to monitor while inpatient  -Medications on hold as above -Receiving lisinopril (may be leading to his cough if negative CTA chest, would  look to change), hydralazine, and metoprolol   4. HLD: -Lipitor 10 mg   5. Bipolar disorder/Mood swings: -Per behavioral health   6. Elevated dimer: -Patient with O2 saturations in the 70's upon admission coupled with sinus tachycardia  -Recommend CTA chest to evaluate for PE  7. OSA on CPAP   Signed, Eula Listen, PA-C Pager: [phone] 09/02/2015, 7:38 AM

## 2015-09-02 NOTE — Progress Notes (Addendum)
[facility] - [place] at [facility]   PATIENT NAME: Christian [name]    MR#:  [id number]  DATE OF BIRTH:  1950/12/20  SUBJECTIVE: I feel pretty good CHIEF COMPLAINT:  No chief complaint on file.  Patient is 64 year old African-American male with history of depression and resultant anorexia, weight loss, malnutrition who was admitted to behavioral health unit on 08/29/2015 developed dizziness, lightheadedness and was found to have orthostatic hypotension and was admitted to the medical unit. He denies any significant discomfort, no pain . Orthostatic vital signs revealed a significant drop of blood pressure from 145 systolic to 84. Patient was not initiated on IV fluids yet. He was also noted to be somnolent, getting down to sleep while he is discussing medical issues with attending his oxygen saturations were found to be low at 75% on room air at rest intermittently . First troponin was found to be elevated 0.06. Patient denies any chest pains. Blood glucose levels are between 120s to 200s. D-dimer was found to be elevated and CT angiogram of chest is pending. Echocardiogram was unremarkable, no pulmonary hypertension    Review of Systems  Constitutional: Negative for fever, chills and weight loss.  HENT: Negative for congestion.   Eyes: Negative for blurred vision and double vision.  Respiratory: Positive for cough and shortness of breath. Negative for sputum production and wheezing.   Cardiovascular: Negative for chest pain, palpitations, orthopnea, leg swelling and PND.  Gastrointestinal: Negative for nausea, vomiting, abdominal pain, diarrhea, constipation and blood in stool.  Genitourinary: Negative for dysuria, urgency, frequency and hematuria.  Musculoskeletal: Negative for falls.  Neurological: Negative for dizziness, tremors, focal weakness and headaches.  Endo/Heme/Allergies: Does not bruise/bleed easily.  Psychiatric/Behavioral: Negative for depression. The  patient does not have insomnia.     VITAL SIGNS: Blood pressure 157/100, pulse 103, temperature 97.9 F (36.6 C), temperature source Oral, resp. rate 18, height 6' (1.829 m), weight 97.841 kg (215 lb 11.2 oz), SpO2 100 %.  PHYSICAL EXAMINATION:   GENERAL:  64 y.o.-year-old patient lying in the bed with no acute distress.  EYES: Pupils equal, round, reactive to light and accommodation. No scleral icterus. Extraocular muscles intact.  HEENT: Head atraumatic, normocephalic. Oropharynx and nasopharynx clear.  NECK:  Supple, no jugular venous distention. No thyroid enlargement, no tenderness.  LUNGS: Normal breath sounds bilaterally, no wheezing, rales,rhonchi or crepitation. No use of accessory muscles of respiration.  CARDIOVASCULAR: S1, S2 normal. No murmurs, rubs, or gallops.  ABDOMEN: Soft, nontender, nondistended. Bowel sounds present. No organomegaly or mass.  EXTREMITIES: No pedal edema, cyanosis, or clubbing.  NEUROLOGIC: Cranial nerves II through XII are intact. Muscle strength 5/5 in all extremities. Sensation intact. Gait not checked.  PSYCHIATRIC: The patient is alert and oriented x 3.  SKIN: No obvious rash, lesion, or ulcer.   ORDERS/RESULTS REVIEWED:   CBC  Recent Labs Lab 08/31/15 1132  WBC 5.0  HGB 12.8*  HCT 38.9*  PLT 172  MCV 90.3  MCH 29.8  MCHC 33.0  RDW 13.9  LYMPHSABS 1.1  MONOABS 0.5  EOSABS 0.1  BASOSABS 0.0   ------------------------------------------------------------------------------------------------------------------  Chemistries   Recent Labs Lab 08/31/15 1132  NA 138  K 4.4  CL 104  CO2 27  GLUCOSE 208*  BUN 21*  CREATININE 1.01  CALCIUM 9.3  AST 15  ALT 20  ALKPHOS 62  BILITOT 0.8   ------------------------------------------------------------------------------------------------------------------ estimated creatinine clearance is 91.9 mL/min (by C-G formula based on Cr of  1.01). ------------------------------------------------------------------------------------------------------------------ No results  for input(s): TSH, T4TOTAL, T3FREE, THYROIDAB in the last 72 hours.  Invalid input(s): FREET3  Cardiac Enzymes  Recent Labs Lab 08/31/15 1804 09/01/15 0500  TROPONINI 0.06* 0.03   ------------------------------------------------------------------------------------------------------------------ Invalid input(s): POCBNP ---------------------------------------------------------------------------------------------------------------  RADIOLOGY: Dg Chest Port 1 View  08/31/2015   CLINICAL DATA:  PICC line placement  EXAM: PORTABLE CHEST 1 VIEW  COMPARISON:  07/29/2015  FINDINGS: The right upper extremity PICC line extends to the SVC with tip at the level of the azygos vein junction. The lungs are clear. Mediastinal and hilar contours are unremarkable.  IMPRESSION: Satisfactorily positioned right upper extremity PICC line. No acute cardiopulmonary findings.   Electronically Signed   By: Ellery Plunk M.D.   On: 08/31/2015 21:43    EKG:  Orders placed or performed during the hospital encounter of 08/27/15  . EKG 12-Lead  . EKG 12-Lead    ASSESSMENT AND PLAN:  Active Problems:   Syncope, near 1. Near syncope, likely due to orthostatic hypotension,  continue patient on IV fluids. Awaiting for carotid ultrasound results, echocardiogram was done in August 2015 , and during this admission, unremarkable. Getting CT angiogram of the chest since patient's d-dimer is elevated, he is tachycardic and hypoxic intermittently.  2. Orthostatic hypotension as above. Resolved now , continue low rate IV fluids and follow patient's orthostatic vital signs closely. Etiology is likely diabetes as well as poor oral intake, hemoglobin A1c was found to be 7.5. Discussed this patient that the diabetes could lead him to dehydration and he is agreeable to advance his oral fluid  intake.  3. Elevated troponin. Echocardiogram, appreciate cardiology input, consider outpatient nuclear stress test according to cardiologist 4. Diabetes mellitus, hemoglobin A1c  7.5. Continue outpatient medications. Advanced insulin Lantus and addedprandial short-acting insulin, blood glucose levels are between 120s to 200s, advised good oral fluid intake 5. Somnolence, possibly withdrawal , etiology is likely depression, unremarkable ABGs .  6. Short hypoxic episode on admission, tachycardia as well as hypotension and elevated d-dimer. Getting CT scan of the chest with IV contrast to rule out pulmonary embolism  Management plans discussed with the patient, family and they are in agreement.   DRUG ALLERGIES: No Known Allergies  CODE STATUS:     Code Status Orders        Start     Ordered   08/31/15 1703  Full code   Continuous     08/31/15 1702      TOTAL TIME TAKING CARE OF THIS PATIENT: 45 minutes.    Katharina Caper M.D on 09/02/2015 at 11:11 AM  Between 7am to 6pm - Pager - [phone]  After 6pm go to [email or web address] - password [email or web address] [facility]  [place]  [facility]  Office  [phone]  CC: Primary care physician; Leanor Rubenstein, MD   PS just got called by radiologist. Patient does have numerous pulmonary embolism areas in bilateral lungs, as well as stellate patchy infiltrate in left upper lobe, we will be asking pharmacy consult for adequate dosing and initiation

## 2015-09-03 ENCOUNTER — Inpatient Hospital Stay: Payer: Commercial Managed Care - HMO

## 2015-09-03 LAB — CBC
HCT: 37.3 % — ABNORMAL LOW (ref 40.0–52.0)
Hemoglobin: 12.5 g/dL — ABNORMAL LOW (ref 13.0–18.0)
MCH: 30.3 pg (ref 26.0–34.0)
MCHC: 33.5 g/dL (ref 32.0–36.0)
MCV: 90.6 fL (ref 80.0–100.0)
Platelets: 168 10*3/uL (ref 150–440)
RBC: 4.12 MIL/uL — ABNORMAL LOW (ref 4.40–5.90)
RDW: 13.6 % (ref 11.5–14.5)
WBC: 3.9 10*3/uL (ref 3.8–10.6)

## 2015-09-03 LAB — CREATININE, SERUM
Creatinine, Ser: 0.83 mg/dL (ref 0.61–1.24)
GFR calc Af Amer: >60 mL/min (ref >60)
GFR calc non Af Amer: >60 mL/min (ref >60)

## 2015-09-03 LAB — GLUCOSE, CAPILLARY
Glucose-Capillary: 108 mg/dL — ABNORMAL HIGH (ref 65–99)
Glucose-Capillary: 234 mg/dL — ABNORMAL HIGH (ref 65–99)
Glucose-Capillary: 216 mg/dL — ABNORMAL HIGH (ref 65–99)
Glucose-Capillary: 196 mg/dL — ABNORMAL HIGH (ref 65–99)
Glucose-Capillary: 135 mg/dL — ABNORMAL HIGH (ref 65–99)

## 2015-09-03 MED ORDER — LEVOFLOXACIN 750 MG PO TABS
750.0000 mg | ORAL_TABLET | Freq: Every day | ORAL | Status: DC
  Administered 2015-09-03 – 2015-09-05 (×3): 750 mg via ORAL
  Filled 2015-09-03 (×3): qty 1

## 2015-09-03 NOTE — Care Management Note (Signed)
Case Management Note  Patient Details  Name: Christian [name] MRN: [id number] Date of Birth: [birth year]  Subjective/Objective:    Admitted to [facility]  08/29/2015 with bipolar depression. Follow by Dr. Toni Amend.  Transferred to 2a following a near syncopal episode. Found to be orthostatic. Noted to have multiple bilateral pulmonary embolus on CT angiogram.  PCP,  Dr. Marcy Siren        Action/Plan: Will follow progression and assist as needed.   Expected Discharge Date:                  Expected Discharge Plan:     In-House Referral:     Discharge planning Services     Post Acute Care Choice:    Choice offered to:     DME Arranged:    DME Agency:     HH Arranged:    HH Agency:     Status of Service:  In process, will continue to follow  Medicare Important Message Given:  Yes-fourth notification given Date Medicare IM Given:    Medicare IM give by:    Date Additional Medicare IM Given:    Additional Medicare Important Message give by:     If discussed at Long Length of Stay Meetings, dates discussed:    Additional Comments:  Marily Memos, RN 09/03/2015, 8:45 AM

## 2015-09-03 NOTE — Consult Note (Signed)
  Psychiatry: Follow-up for this 64 year old man with bipolar disorder severe with psychotic depression. Patient is now on the medical service being treated for what turns out to be a pulmonary embolism.  I did not get to speak directly with the patient today but spoke with his wife at some length. She tells me that she sees him continuing to have improvements in his mood but still being very prone to being withdrawn and passive. She is afraid of him withdrawing back into depression when he comes home.  Hopefully we will be able to transfer him back down stairs to psychiatry tomorrow. Continue current medicines for now. We may or may not need to do any more ECT at this point. We certainly are going to need to have a good outpatient treatment in place including maintenance ECT. Social work downstairs can look into whether any kind of placement such as rehabilitation would be available prior to discharge.  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

## 2015-09-03 NOTE — Progress Notes (Signed)
[facility] [place] [facility]   PATIENT NAME: Christian [name]    MR#:  [id number]  DATE OF BIRTH:  09/17/1951  SUBJECTIVE: I feel pretty good CHIEF COMPLAINT:  No chief complaint on file.  Patient is 63 year old African-American male with history of depression and resultant anorexia, weight loss, malnutrition who was admitted to behavioral health unit on 08/29/2015 developed dizziness, lightheadedness and was found to have orthostatic hypotension and was admitted to the medical unit. He denies any significant discomfort, no pain . Orthostatic vital signs revealed a significant drop of blood pressure from 145 systolic to 84. Patient was not initiated on IV fluids yet. He was also noted to be somnolent, getting down to sleep while he is discussing medical issues with attending his oxygen saturations were found to be low at 75% on room air at rest intermittently . First troponin was found to be elevated 0.06. Patient denies any chest pains. Blood glucose levels are between 120s to 200s. D-dimer was found to be elevated and CT angiogram of chest revealed pulmonary embolism, now on Eliquis. Echocardiogram was unremarkable, no pulmonary hypertension. Doppler ultrasound of lower extremities is pending. Patient feels comfortable, although admits of having some knots in lower extremities. No significant swelling    Review of Systems  Constitutional: Negative for fever, chills and weight loss.  HENT: Negative for congestion.   Eyes: Negative for blurred vision and double vision.  Respiratory: Positive for cough and shortness of breath. Negative for sputum production and wheezing.   Cardiovascular: Negative for chest pain, palpitations, orthopnea, leg swelling and PND.  Gastrointestinal: Negative for nausea, vomiting, abdominal pain, diarrhea, constipation and blood in stool.  Genitourinary: Negative for dysuria, urgency, frequency and hematuria.  Musculoskeletal: Negative  for falls.  Neurological: Negative for dizziness, tremors, focal weakness and headaches.  Endo/Heme/Allergies: Does not bruise/bleed easily.  Psychiatric/Behavioral: Negative for depression. The patient does not have insomnia.     VITAL SIGNS: Blood pressure 98/55, pulse 91, temperature 98 F (36.7 C), temperature source Oral, resp. rate 18, height 6' (1.829 m), weight 97.841 kg (215 lb 11.2 oz), SpO2 99 %.  PHYSICAL EXAMINATION:   GENERAL:  64 y.o.-year-old patient lying in the bed with no acute distress.  EYES: Pupils equal, round, reactive to light and accommodation. No scleral icterus. Extraocular muscles intact.  HEENT: Head atraumatic, normocephalic. Oropharynx and nasopharynx clear.  NECK:  Supple, no jugular venous distention. No thyroid enlargement, no tenderness.  LUNGS: Normal breath sounds bilaterally, no wheezing, rales,rhonchi or crepitation. No use of accessory muscles of respiration.  CARDIOVASCULAR: S1, S2 normal. No murmurs, rubs, or gallops.  ABDOMEN: Soft, nontender, nondistended. Bowel sounds present. No organomegaly or mass.  EXTREMITIES: No pedal edema, cyanosis, or clubbing.  NEUROLOGIC: Cranial nerves II through XII are intact. Muscle strength 5/5 in all extremities. Sensation intact. Gait not checked.  PSYCHIATRIC: The patient is alert and oriented x 3.  SKIN: No obvious rash, lesion, or ulcer. Edema. Superficial nodularity of lower extremities, likely superficial femoral phlebitis left lower extremity more than right lower extremity  ORDERS/RESULTS REVIEWED:   CBC  Recent Labs Lab 08/31/15 1132 09/03/15 0600  WBC 5.0 3.9  HGB 12.8* 12.5*  HCT 38.9* 37.3*  PLT 172 168  MCV 90.3 90.6  MCH 29.8 30.3  MCHC 33.0 33.5  RDW 13.9 13.6  LYMPHSABS 1.1  --   MONOABS 0.5  --   EOSABS 0.1  --   BASOSABS 0.0  --    ------------------------------------------------------------------------------------------------------------------  Chemistries   Recent  Labs Lab 08/31/15 1132 09/03/15 0600  NA 138  --   K 4.4  --   CL 104  --   CO2 27  --   GLUCOSE 208*  --   BUN 21*  --   CREATININE 1.01 0.83  CALCIUM 9.3  --   AST 15  --   ALT 20  --   ALKPHOS 62  --   BILITOT 0.8  --    ------------------------------------------------------------------------------------------------------------------ estimated creatinine clearance is 111.9 mL/min (by C-G formula based on Cr of 0.83). ------------------------------------------------------------------------------------------------------------------ No results for input(s): TSH, T4TOTAL, T3FREE, THYROIDAB in the last 72 hours.  Invalid input(s): FREET3  Cardiac Enzymes  Recent Labs Lab 08/31/15 1804 09/01/15 0500  TROPONINI 0.06* 0.03   ------------------------------------------------------------------------------------------------------------------ Invalid input(s): POCBNP ---------------------------------------------------------------------------------------------------------------  RADIOLOGY: Ct Angio Chest Pe W/cm &/or Wo Cm  09/02/2015   CLINICAL DATA:  Hypotension, hypoxia and elevated D-dimer.  EXAM: CT ANGIOGRAPHY CHEST WITH CONTRAST  TECHNIQUE: Multidetector CT imaging of the chest was performed using the standard protocol during bolus administration of intravenous contrast. Multiplanar CT image reconstructions and MIPs were obtained to evaluate the vascular anatomy.  CONTRAST:  OMNIPAQUE IOHEXOL 350 MG/ML SOLN  COMPARISON:  03/23/2011  FINDINGS: There is evidence of bilateral pulmonary embolism. Nonocclusive and nearly occlusive thrombus is identified in the proximal aspect of the left upper lobe pulmonary artery. Nonocclusive thrombus is present in the right upper lobe pulmonary artery. Nonocclusive small amount of mural thrombus present at the bifurcation of the interlobar right pulmonary artery. Based on appearance of the thrombus, some of the thrombus is felt to likely be  chronic, especially in the right interlobar pulmonary artery and left upper lobe.  There is no evidence of right heart strain by CT with normal LV/RV ratio identified (0.9). No evidence of cardiac enlargement or pericardial fluid.  New area of patchy infiltrate and scarring present in the left upper lobe which was not present on the prior CT. Although some of these areas may represent scarring from previous infection or pulmonary embolism, a component of acute infiltrate cannot be excluded. Patchy atelectasis present in the right lower lung.  There is evidence of coronary artery disease with heavily calcified plaque throughout the coronary tree, including the left main, LAD and left circumflex coronary arteries. The right coronary artery likely contains calcified plaque but is not well evaluated due to cardiac motion.  The thoracic aorta is of normal caliber. No evidence of pulmonary edema or pleural effusions. No nodules or enlarged lymph nodes are seen. No evidence of airway obstruction. Visualized upper abdomen is unremarkable. Bony structures show mild degenerative changes in the thoracic spine.  Review of the MIP images confirms the above findings.  IMPRESSION: 1. Evidence of bilateral pulmonary embolism with nonocclusive thrombus identified in both upper lobes and at the level of the bifurcation of the interlobar right pulmonary artery. Based on mural appearance of much of this thrombus, thrombus is felt to be more likely chronic/subacute rather than acute in nature. Thrombus in the right upper lobe may be more recent. There is no evidence of right heart strain by CT. 2. New patchy area of irregular scarring and infiltrate in the left upper lobe. Some of this may be the residua of prior pulmonary embolism. However, component of active infection cannot be excluded. 3. Coronary atherosclerosis with heavily calcified plaque throughout the coronary tree. 4. These results were called by telephone at the time of  interpretation on 09/02/2015 at  12:10 pm to Dr. Katharina Caper , who verbally acknowledged these results.   Electronically Signed   By: Irish Lack M.D.   On: 09/02/2015 12:15   US Carotid Bilateral  09/02/2015   CLINICAL DATA:  64 year old male with a history of cerebral vascular accident.  Cardiovascular risk factors include hypertension, hyperlipidemia, diabetes.  EXAM: BILATERAL CAROTID DUPLEX ULTRASOUND  TECHNIQUE: Wallace Cullens scale imaging, color Doppler and duplex ultrasound were performed of bilateral carotid and vertebral arteries in the neck.  COMPARISON:  No prior duplex  FINDINGS: Criteria: Quantification of carotid stenosis is based on velocity parameters that correlate the residual internal carotid diameter with NASCET-based stenosis levels, using the diameter of the distal internal carotid lumen as the denominator for stenosis measurement.  The following velocity measurements were obtained:  RIGHT  ICA:  Systolic 79 cm/sec, Diastolic 26 cm/sec  CCA:  93 cm/sec  SYSTOLIC ICA/CCA RATIO:  0.9  ECA:  136 cm/sec  LEFT  ICA:  Systolic 84 cm/sec, Diastolic 37 cm/sec  CCA:  106 cm/sec  SYSTOLIC ICA/CCA RATIO:  0.8  ECA:  67 cm/sec  Right Brachial SBP: Not acquired  Left Brachial SBP: Not acquired  RIGHT CAROTID ARTERY: No significant calcifications of the right common carotid artery. Intermediate waveform maintained. Heterogeneous and partially calcified plaque at the right carotid bifurcation. No significant lumen shadowing. Low resistance waveform of the right ICA. No significant tortuosity.  RIGHT VERTEBRAL ARTERY: Antegrade flow, though developing high resistance waveform with sharp systolic up peak and late reversal of the diastolic component.  LEFT CAROTID ARTERY: No significant calcifications of the left common carotid artery. Intermediate waveform maintained. Heterogeneous and partially calcified plaque at the left carotid bifurcation without significant lumen shadowing. Low resistance waveform of the  left ICA. No significant tortuosity.  LEFT VERTEBRAL ARTERY:  Antegrade flow with low resistance waveform.  IMPRESSION: Color duplex indicates minimal heterogeneous and calcified plaque, with no hemodynamically significant stenosis by duplex criteria in the extracranial cerebrovascular circulation.  There is a developing high resistance waveform of the right vertebral artery, indicating a developing stenosis or possibly occlusion distal to the site of sampling. If there is concern for further imaging evaluation, CT angiogram may be considered.  Signed,  Yvone Neu. Loreta Ave, DO  [facility]  [facility]   Electronically Signed   By: Gilmer Mor D.O.   On: 09/02/2015 12:22    EKG:  Orders placed or performed during the hospital encounter of 08/27/15  . EKG 12-Lead  . EKG 12-Lead    ASSESSMENT AND PLAN:  Active Problems:   Syncope, near   Labile hypertension   Pulmonary embolism with acute cor pulmonale (HCC)   Dyspnea   Orthostatic hypotension   Bipolar disorder, in partial remission, most recent episode depressed (HCC)   Bilateral pulmonary embolism (HCC) 1. Near syncope, thought to be due to orthostatic hypotension, but most likely due to acute pulmonary embolism,  of IV fluids. Unremarkable carotid ultrasound , , echocardiogram  2. Acute respiratory failure with hypoxia short lived likely due to acute pulmonary embolism, continue patient on oxygen therapy as needed as well as Eliquis 3. Orthostatic hypotension as above. Resolved now , off IV fluids,  following patient's orthostatic vital signs closely. Etiology was thought to be due to diabetes as well as poor oral intake, hemoglobin A1c was found to be 7.5. Discussed this patient that the diabetes could lead him to dehydration and he is agreeable to advance his oral fluid intake. Now it  seems that this hypotension episode could have been related to acute pulmonary embolism, resolved now 4. Elevated  troponin. Echocardiogram was unremarkable, appreciate cardiology input, consider outpatient nuclear stress test according to cardiologist, but unlikely in the next few weeks due to acute pulmonary embolism episode, follow up with cardiologist as outpatient 5. Diabetes mellitus, hemoglobin A1c  7.5. Continue outpatient medications. Advanced insulin Lantus and addedprandial short-acting insulin, blood glucose levels are between 120s to 200s, advised good oral fluid intake, stable 6. Somnolence,  etiology is likely depression, unremarkable ABGs .  7. Bipolar disorder, depression, patient is to return back to behavioral medicine as soon as workup is complete 8. Left upper lobe nodule, unclear etiology, questionable infection, initiate patient on levofloxacin orally  Management plans discussed with the patient, family and they are in agreement.   DRUG ALLERGIES: No Known Allergies  CODE STATUS:     Code Status Orders        Start     Ordered   08/31/15 1703  Full code   Continuous     08/31/15 1702      TOTAL TIME TAKING CARE OF THIS PATIENT: 45 minutes.  Long discussion with patient about pulmonary embolism etiology and treatment plan as well as discharge planning. All questions were answered. Emotional support provided. Time spent about 15 minutes on discussion  Cheyne Boulden M.D on 09/03/2015 at 1:06 PM  Between 7am to 6pm - Pager - [phone]  After 6pm go to [email or web address] - password [email or web address] [facility]  [facility]  Office  [phone]  CC: Primary care physician; Leanor Rubenstein, MD

## 2015-09-04 LAB — GLUCOSE, CAPILLARY
Glucose-Capillary: 129 mg/dL — ABNORMAL HIGH (ref 65–99)
Glucose-Capillary: 185 mg/dL — ABNORMAL HIGH (ref 65–99)
Glucose-Capillary: 113 mg/dL — ABNORMAL HIGH (ref 65–99)
Glucose-Capillary: 118 mg/dL — ABNORMAL HIGH (ref 65–99)

## 2015-09-04 MED ORDER — SODIUM CHLORIDE 0.9 % IV SOLN
INTRAVENOUS | Status: DC
  Administered 2015-09-04: 15:00:00 via INTRAVENOUS

## 2015-09-04 MED ORDER — INSULIN GLARGINE 100 UNIT/ML ~~LOC~~ SOLN
17.0000 [IU] | Freq: Every day | SUBCUTANEOUS | Status: DC
  Administered 2015-09-04: 17 [IU] via SUBCUTANEOUS
  Filled 2015-09-04 (×3): qty 0.17

## 2015-09-04 NOTE — Progress Notes (Signed)
Patient's wife arrived to bring him some clothes and patient stated he would like to walk some or sit in the chair. Stood patient to the chair with no difficulties. After standing one more time to readjust him in the chair, patient stated the room was spinning. Blood pressure checked, 74/59. No shortness of breath noted, just dizziness and patient stated "I'm feeling like I'm going to go down". Got patient back to bed and he has shown improvement in the symptoms. MD notified, will place fluid orders.

## 2015-09-04 NOTE — Progress Notes (Addendum)
Alert and oriented. Vitals are now stable. Blood pressure has improved since patient is lying back down and continuous fluids running. Two doses of hydralazine not given due to hypotension today. Sinus tach in low 100's to sinus rhythm on tele. No complaints of pain. Patient has a very flat affect and does not communicate much. Patient has used the restroom once without measurement. Per patient and wife his baseline is only going once a day. Offered to bladder scan and patient refused. Will continue to monitor.

## 2015-09-04 NOTE — Consult Note (Signed)
[facility] Vascular Consult Note  MRN : [id number]  Christian [name] is a 64 y.o. (Dec 16, 1950) male who presents with chief complaint of No chief complaint on file. Marland Kitchen  History of Present Illness: Patient is a 64 year old male with profound progression gets ECT is admitted with shortness of breath and hypoxia. He also had orthostatic hypotension and felt very faint and had near syncope. He was found to have a pulmonary embolus on a CT angiogram which I have independently reviewed. He was also found to have bilateral lower extremity DVT. He was not on anticoagulation at the time of admission. He has significantly diminished mobility due to his severe depression. He's had no major recent trauma or surgery. He has no fevers or chills.  Current Facility-Administered Medications  Medication Dose Route Frequency Provider Last Rate Last Dose  . 0.9 %  sodium chloride infusion  250 mL Intravenous Once Audery Amel, MD   250 mL at 09/01/15 0815  . 0.9 %  sodium chloride infusion   Intravenous Continuous Katharina Caper, MD 100 mL/hr at 09/04/15 1507    . acetaminophen (TYLENOL) tablet 650 mg  650 mg Oral Q6H PRN Gracelyn Nurse, MD       Or  . acetaminophen (TYLENOL) suppository 650 mg  650 mg Rectal Q6H PRN Gracelyn Nurse, MD      . apixaban Everlene Balls) tablet 10 mg  10 mg Oral BID Sherry Ruffing, RPH   10 mg at 09/04/15 4098  . [START ON 09/09/2015] apixaban (ELIQUIS) tablet 5 mg  5 mg Oral BID Crystal G Arnette Schaumann, RPH      . aspirin EC tablet 81 mg  81 mg Oral Daily Gracelyn Nurse, MD   81 mg at 09/04/15 1191  . atorvastatin (LIPITOR) tablet 10 mg  10 mg Oral Daily Gracelyn Nurse, MD   10 mg at 09/04/15 0926  . cholecalciferol (VITAMIN D) tablet 800 Units  800 Units Oral Daily Gracelyn Nurse, MD   800 Units at 09/04/15 [phone]  . clonazePAM (KLONOPIN) tablet 1 mg  1 mg Oral QHS Gracelyn Nurse, MD   1 mg at 09/03/15 2120  . diltiazem (CARDIZEM CD) 24 hr capsule 120 mg  120  mg Oral Daily Gracelyn Nurse, MD   120 mg at 09/04/15 9562  . feeding supplement (GLUCERNA SHAKE) (GLUCERNA SHAKE) liquid 237 mL  237 mL Oral TID WC Gracelyn Nurse, MD   237 mL at 09/04/15 0800  . finasteride (PROSCAR) tablet 5 mg  5 mg Oral Daily Gracelyn Nurse, MD   5 mg at 09/04/15 1308  . hydrALAZINE (APRESOLINE) tablet 25 mg  25 mg Oral 4 times per day Gracelyn Nurse, MD   25 mg at 09/04/15 0617  . insulin aspart (novoLOG) injection 0-9 Units  0-9 Units Subcutaneous TID WC Katharina Caper, MD   2 Units at 09/04/15 1200  . insulin aspart (novoLOG) injection 5 Units  5 Units Subcutaneous TID WC Katharina Caper, MD   5 Units at 09/04/15 1200  . insulin glargine (LANTUS) injection 17 Units  17 Units Subcutaneous QHS Katharina Caper, MD      . levofloxacin (LEVAQUIN) tablet 750 mg  750 mg Oral Daily Katharina Caper, MD   750 mg at 09/04/15 0927  . lisinopril (PRINIVIL,ZESTRIL) tablet 10 mg  10 mg Oral Daily Gracelyn Nurse, MD   10 mg at 09/04/15 6578  . metoprolol succinate (TOPROL-XL) 24 hr tablet  100 mg  100 mg Oral BID Gracelyn Nurse, MD   100 mg at 09/04/15 1610  . mirtazapine (REMERON) tablet 45 mg  45 mg Oral QHS Audery Amel, MD   45 mg at 09/03/15 2120  . OLANZapine zydis (ZYPREXA) disintegrating tablet 30 mg  30 mg Oral QHS Gracelyn Nurse, MD   30 mg at 09/03/15 2121  . ondansetron (ZOFRAN) tablet 4 mg  4 mg Oral Q6H PRN Gracelyn Nurse, MD      . polyethylene glycol [facility] / GLYCOLAX) packet 17 g  17 g Oral Daily Gracelyn Nurse, MD   17 g at 09/03/15 9604  . sodium chloride 0.9 % injection 10 mL  10 mL Intravenous Q12H Alford Highland, MD   10 mL at 09/04/15 0927  . tamsulosin (FLOMAX) capsule 0.4 mg  0.4 mg Oral QHS Gracelyn Nurse, MD   0.4 mg at 09/03/15 2121    Past Medical History  Diagnosis Date  . Mood swings (HCC)   . Type II or unspecified type diabetes mellitus with unspecified complication, uncontrolled 06/02/2014  . Other and unspecified hyperlipidemia 06/02/2014  . OSA  on CPAP 06/02/2014  . 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 Bipolar disorder (HCC)   . Hypertension   . Gout   . Bipolar 1 disorder (HCC)     History reviewed. No pertinent past surgical history.  Social History Social History  Substance Use Topics  . Smoking status: Never Smoker   . Smokeless tobacco: None  . Alcohol Use: No  No IVDU  Family History Family History  Problem Relation Age of Onset  . Dementia Mother   . Arthritis Father   . Heart failure Neg Hx   . Kidney failure Neg Hx   . Cancer Neg Hx   no bleeding or clotting disorders  No Known Allergies   REVIEW OF SYSTEMS (Negative unless checked)  Constitutional: [] Weight loss  [] Fever  [] Chills Cardiac: [] Chest pain   [] Chest pressure   [] Palpitations   [] Shortness of breath when laying flat   [] Shortness of breath at rest   [] Shortness of breath with exertion. Vascular:  [] Pain in legs with walking   [] Pain in legs at rest   [] Pain in legs when laying flat   [] Claudication   [] Pain in feet when walking  [] Pain in feet at rest  [] Pain in feet when laying flat   [] History of DVT   [x] Phlebitis   [x] Swelling in legs   [] Varicose veins   [] Non-healing ulcers Pulmonary:   [] Uses home oxygen   [] Productive cough   [] Hemoptysis   [] Wheeze  [] COPD   [] Asthma Neurologic:  [] Dizziness  [] Blackouts   [] Seizures   [] History of stroke   [] History of TIA  [] Aphasia   [] Temporary blindness   [] Dysphagia   [] Weakness or numbness in arms   [] Weakness or numbness in legs Musculoskeletal:  [] Arthritis   [] Joint swelling   [] Joint pain   [] Low back pain Hematologic:  [] Easy bruising  [] Easy bleeding   [] Hypercoagulable state   [] Anemic  [] Hepatitis Gastrointestinal:  [] Blood in stool   [] Vomiting blood  [] Gastroesophageal reflux/heartburn   [] Difficulty swallowing. Genitourinary:  [] Chronic kidney disease   [] Difficult urination  [] Frequent urination  [] Burning with urination   [] Blood in  urine Skin:  [] Rashes   [] Ulcers   [] Wounds Psychological:  [] History of anxiety   [x]  History of major depression.  Physical Examination  Filed Vitals:  09/04/15 0020 09/04/15 0615 09/04/15 1148 09/04/15 1409  BP: 132/94 127/82 109/72 74/59  Pulse: 101 86 100 100  Temp:  98.2 F (36.8 C) 98.4 F (36.9 C)   TempSrc:  Oral Oral   Resp: Height:      Weight:      SpO2: 100% 100% 99%    Body mass index is 29.25 kg/(m^2). Gen:  WD/WN, NAD Head: /AT, No temporalis wasting. Prominent temp pulse not noted. Ear/Nose/Throat: Hearing grossly intact, nares w/o erythema or drainage, oropharynx w/o Erythema/Exudate Eyes: PERRLA, EOMI.  Neck: Supple, no nuchal rigidity.  No bruit or JVD.  Pulmonary:  Good air movement, clear to auscultation bilaterally.  Cardiac: RRR, normal S1, S2, no Murmurs, rubs or gallops. Vascular:  Vessel Right Left  Radial Palpable Palpable  Ulnar Palpable Palpable  Brachial Palpable Palpable  Carotid Palpable, without bruit Palpable, without bruit  Aorta Not palpable N/A  Femoral Palpable Palpable  Popliteal Palpable Palpable  PT Palpable Palpable  DP Palpable Palpable   Gastrointestinal: soft, non-tender/non-distended. No guarding/reflex. No masses, surgical incisions, or scars. Musculoskeletal: M/S 5/5 throughout.  Extremities without ischemic changes.  No deformity or atrophy. Mild bilateral LE edema. Neurologic: CN 2-12 intact. Pain and light touch intact in extremities.  Symmetrical.  Speech is fluent. Motor exam as listed above. Psychiatric: flat affect and depressed mood Dermatologic: No rashes or ulcers noted.  No cellulitis or open wounds. Lymph : No Cervical, Axillary, or Inguinal lymphadenopathy.      CBC Lab Results  Component Value Date   WBC 3.9 09/03/2015   HGB 12.5* 09/03/2015   HCT 37.3* 09/03/2015   MCV 90.6 09/03/2015   PLT 168 09/03/2015    BMET    Component Value Date/Time   NA 138 08/31/2015 1132   NA 139  08/10/2014 1327   K 4.4 08/31/2015 1132   K 4.3 08/10/2014 1327   CL 104 08/31/2015 1132   CL 105 08/10/2014 1327   CO2 27 08/31/2015 1132   CO2 27 08/10/2014 1327   GLUCOSE 208* 08/31/2015 1132   GLUCOSE 109* 08/10/2014 1327   BUN 21* 08/31/2015 1132   BUN 13 08/10/2014 1327   CREATININE 0.83 09/03/2015 0600   CREATININE 1.25 08/10/2014 1327   CALCIUM 9.3 08/31/2015 1132   CALCIUM 8.8 08/10/2014 1327   GFRNONAA >60 09/03/2015 0600   GFRNONAA >60 08/10/2014 1327   GFRAA >60 09/03/2015 0600   GFRAA >60 08/10/2014 1327   Estimated Creatinine Clearance: 111.9 mL/min (by C-G formula based on Cr of 0.83).  COAG Lab Results  Component Value Date   INR 0.99 03/23/2011   INR 1.0 05/16/2009    Radiology Ct Angio Chest Pe W/cm &/or Wo Cm  09/02/2015   CLINICAL DATA:  Hypotension, hypoxia and elevated D-dimer.  EXAM: CT ANGIOGRAPHY CHEST WITH CONTRAST  TECHNIQUE: Multidetector CT imaging of the chest was performed using the standard protocol during bolus administration of intravenous contrast. Multiplanar CT image reconstructions and MIPs were obtained to evaluate the vascular anatomy.  CONTRAST:  OMNIPAQUE IOHEXOL 350 MG/ML SOLN  COMPARISON:  03/23/2011  FINDINGS: There is evidence of bilateral pulmonary embolism. Nonocclusive and nearly occlusive thrombus is identified in the proximal aspect of the left upper lobe pulmonary artery. Nonocclusive thrombus is present in the right upper lobe pulmonary artery. Nonocclusive small amount of mural thrombus present at the bifurcation of the interlobar right pulmonary artery. Based on appearance of the thrombus, some of the thrombus is felt  to likely be chronic, especially in the right interlobar pulmonary artery and left upper lobe.  There is no evidence of right heart strain by CT with normal LV/RV ratio identified (0.9). No evidence of cardiac enlargement or pericardial fluid.  New area of patchy infiltrate and scarring present in the left upper  lobe which was not present on the prior CT. Although some of these areas may represent scarring from previous infection or pulmonary embolism, a component of acute infiltrate cannot be excluded. Patchy atelectasis present in the right lower lung.  There is evidence of coronary artery disease with heavily calcified plaque throughout the coronary tree, including the left main, LAD and left circumflex coronary arteries. The right coronary artery likely contains calcified plaque but is not well evaluated due to cardiac motion.  The thoracic aorta is of normal caliber. No evidence of pulmonary edema or pleural effusions. No nodules or enlarged lymph nodes are seen. No evidence of airway obstruction. Visualized upper abdomen is unremarkable. Bony structures show mild degenerative changes in the thoracic spine.  Review of the MIP images confirms the above findings.  IMPRESSION: 1. Evidence of bilateral pulmonary embolism with nonocclusive thrombus identified in both upper lobes and at the level of the bifurcation of the interlobar right pulmonary artery. Based on mural appearance of much of this thrombus, thrombus is felt to be more likely chronic/subacute rather than acute in nature. Thrombus in the right upper lobe may be more recent. There is no evidence of right heart strain by CT. 2. New patchy area of irregular scarring and infiltrate in the left upper lobe. Some of this may be the residua of prior pulmonary embolism. However, component of active infection cannot be excluded. 3. Coronary atherosclerosis with heavily calcified plaque throughout the coronary tree. 4. These results were called by telephone at the time of interpretation on 09/02/2015 at 12:10 pm to Dr. Katharina Caper , who verbally acknowledged these results.   Electronically Signed   By: Irish Lack M.D.   On: 09/02/2015 12:15   US Carotid Bilateral  09/02/2015   CLINICAL DATA:  64 year old male with a history of cerebral vascular accident.   Cardiovascular risk factors include hypertension, hyperlipidemia, diabetes.  EXAM: BILATERAL CAROTID DUPLEX ULTRASOUND  TECHNIQUE: Wallace Cullens scale imaging, color Doppler and duplex ultrasound were performed of bilateral carotid and vertebral arteries in the neck.  COMPARISON:  No prior duplex  FINDINGS: Criteria: Quantification of carotid stenosis is based on velocity parameters that correlate the residual internal carotid diameter with NASCET-based stenosis levels, using the diameter of the distal internal carotid lumen as the denominator for stenosis measurement.  The following velocity measurements were obtained:  RIGHT  ICA:  Systolic 79 cm/sec, Diastolic 26 cm/sec  CCA:  93 cm/sec  SYSTOLIC ICA/CCA RATIO:  0.9  ECA:  136 cm/sec  LEFT  ICA:  Systolic 84 cm/sec, Diastolic 37 cm/sec  CCA:  106 cm/sec  SYSTOLIC ICA/CCA RATIO:  0.8  ECA:  67 cm/sec  Right Brachial SBP: Not acquired  Left Brachial SBP: Not acquired  RIGHT CAROTID ARTERY: No significant calcifications of the right common carotid artery. Intermediate waveform maintained. Heterogeneous and partially calcified plaque at the right carotid bifurcation. No significant lumen shadowing. Low resistance waveform of the right ICA. No significant tortuosity.  RIGHT VERTEBRAL ARTERY: Antegrade flow, though developing high resistance waveform with sharp systolic up peak and late reversal of the diastolic component.  LEFT CAROTID ARTERY: No significant calcifications of the left common carotid artery. Intermediate waveform  maintained. Heterogeneous and partially calcified plaque at the left carotid bifurcation without significant lumen shadowing. Low resistance waveform of the left ICA. No significant tortuosity.  LEFT VERTEBRAL ARTERY:  Antegrade flow with low resistance waveform.  IMPRESSION: Color duplex indicates minimal heterogeneous and calcified plaque, with no hemodynamically significant stenosis by duplex criteria in the extracranial cerebrovascular circulation.   There is a developing high resistance waveform of the right vertebral artery, indicating a developing stenosis or possibly occlusion distal to the site of sampling. If there is concern for further imaging evaluation, CT angiogram may be considered.  Signed,  Yvone Neu. Loreta Ave, DO  [facility]  [facility]   Electronically Signed   By: Gilmer Mor D.O.   On: 09/02/2015 12:22   US Venous Img Lower Bilateral  09/03/2015   CLINICAL DATA:  Pulmonary thromboembolism.  EXAM: BILATERAL LOWER EXTREMITY VENOUS DUPLEX ULTRASOUND  TECHNIQUE: Doppler venous assessment of the bilateral lower extremity deep venous system was performed, including characterization of spectral flow, compressibility, and phasicity.  COMPARISON:  None.  FINDINGS: In the right lower extremity, the common femoral and popliteal veins are compressible. There is noncompressible thrombus in the mid femoral vein. The proximal and distal femoral vein are compressible. No evidence of calf vein DVT in the right lower extremity. Doppler analysis demonstrates respiratory phasicity. Augmentation was deferred.  In the left lower extremity, the common femoral and popliteal veins are compressible. The femoral vein is noncompressible extending from its origin to the mid to distal thigh. The distal femoral vein is compressible. Doppler analysis demonstrates respiratory phasicity. No calf vein DVT.  IMPRESSION: The study is positive for bilateral lower extremity femoral vein DVT as described above.   Electronically Signed   By: Jolaine Click M.D.   On: 09/03/2015 17:18   Dg Chest Port 1 View  08/31/2015   CLINICAL DATA:  PICC line placement  EXAM: PORTABLE CHEST 1 VIEW  COMPARISON:  07/29/2015  FINDINGS: The right upper extremity PICC line extends to the SVC with tip at the level of the azygos vein junction. The lungs are clear. Mediastinal and hilar contours are unremarkable.  IMPRESSION: Satisfactorily positioned  right upper extremity PICC line. No acute cardiopulmonary findings.   Electronically Signed   By: Ellery Plunk M.D.   On: 08/31/2015 21:43     Assessment/Plan 1. Pulmonary embolus and bilateral lower extremity DVTs. Was not on anticoagulation when these occurred. Would agree with full anticoagulation for a minimum of 6 and possibly up to 12 months. At this point, there is no clear-cut indication for an IVC filter given his intolerance of anticoagulation so far. He does not have profound hypoxia at this point and I do not believe that he is in a situation where any further pulmonary embolus will be clearly fatal. I have discussed the placement of an IVC filter and the fact that it can be removed, and that it would be a reasonable option should he have any problems going forward. He is agreeable to continue anticoagulation alone at this point 2. Depression. Gets ECT and this can continue unless the psychiatrists feel there is a contraindication.   3. Orthostatic hypotension. Possibly related to number 1. 4. Diabetes mellitus. Stable. Continue outpatient meds   Yazmine Sorey, MD  09/04/2015 3:35 PM

## 2015-09-04 NOTE — Consult Note (Signed)
[facility] Consult   Reason for Consult:  Follow-up consult for 64 year old man with bipolar disorder with severe depression. He has been on the medical service now for most of this week because of syncope which turns out to probably be from a pulmonary embolism. Patient appears to be about ready for discharge from the medical service. Patient tells me he is feeling okay. He does not feel particularly depressed. He is still very slow and seems somewhat cognitively impaired. Referring Physician:  Ether Griffins Patient Identification: Christian [name] MRN:  [id number] Principal Diagnosis: <principal problem not specified> Diagnosis:   Patient Active Problem List   Diagnosis Date Noted  . Bilateral pulmonary embolism ([place]) [I26.99] 09/02/2015  . Labile hypertension [I10]   . Pulmonary embolism with acute cor pulmonale ([place]) [I26.09]   . Dyspnea [R06.00]   . Orthostatic hypotension [I95.1]   . Bipolar disorder, in partial remission, most recent episode depressed ([place]) [F31.75]   . Syncope, near [R55] 08/31/2015  . Affective psychosis, bipolar ([place]) [F31.9]   . Bipolar disorder, now depressed ([place]) [F31.30] 08/28/2015  . Bipolar I disorder, severe, current or most recent episode depressed, with psychotic features ([place]) [F31.5]   . Pressure ulcer [L89.90] 07/30/2015  . Protein-calorie malnutrition, severe ([place]) [E43] 07/30/2015  . Dehydration [E86.0] 07/29/2015  . Bipolar disorder, current episode depressed, severe, with psychotic features ([place]) [F31.5] 07/09/2015  . Diabetes ([place]) [E11.9] 07/09/2015  . UTI (urinary tract infection) [N39.0] 06/19/2014  . Positive blood culture [R78.81] 06/19/2014  . Bipolar affective disorder, current episode manic with psychotic symptoms ([place]) [F31.2] 06/17/2014  . Type II or unspecified type diabetes mellitus with unspecified complication, uncontrolled [E11.8, E11.65] 06/02/2014  . Other and unspecified hyperlipidemia [E78.5] 06/02/2014  . OSA on  CPAP [G47.33] 06/02/2014  . 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 Gout [M10.9] 06/02/2014  . Hypertension [I10]     Total Time spent with patient: 30 minutes  Subjective:   Christian [name] is a 64 y.o. male patient admitted with "I guess I'm alright".  HPI:  Update is that since last visit he has been seen by vascular surgery and it does not appear that another procedures indicated this point. Medical management as planned. Patient seems to of maintained the benefit that he was getting from the ECT. 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 Mood swings ([place])   . Type II or unspecified type diabetes mellitus with unspecified complication, uncontrolled 06/02/2014  . Other and unspecified hyperlipidemia 06/02/2014  . OSA on CPAP 06/02/2014  . 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 Bipolar disorder ([place])   . Hypertension   . Gout   . Bipolar 1 disorder ([place])    History reviewed. No pertinent past surgical history. Family History:  Family History  Problem Relation Age of Onset  . Dementia Mother   . Arthritis Father   . Heart failure Neg Hx   . Kidney failure Neg Hx   . Cancer Neg Hx    Family Psychiatric  History: Nonidentified Social History:  History  Alcohol Use No     History  Drug Use No    Social History  Social History  . Marital Status: Married    Spouse Name: N/A  . Number of Children: N/A  . Years of Education: N/A   Social History Main Topics  . Smoking status: Never Smoker   . Smokeless tobacco: None  . Alcohol Use: No  . Drug Use: No  . Sexual Activity: Not Currently   Other Topics Concern   . None   Social History Narrative   *The patient has been married for over 30 years and is currently on disability. He lives with his wife in the [place] area and has one daughter.             Additional Social History:                          Allergies:  No Known Allergies  Labs:  Results for orders placed or performed during the hospital encounter of 08/31/15 (from the past 48 hour(s))  Glucose, capillary     Status: Abnormal   Collection Time: 09/02/15  8:12 PM  Result Value Ref Range   Glucose-Capillary 204 (H) 65 - 99 mg/dL   Comment 1 Notify RN   Creatinine, serum     Status: None   Collection Time: 09/03/15  6:00 AM  Result Value Ref Range   Creatinine, Ser 0.83 0.61 - 1.24 mg/dL   GFR calc non Af Amer >60 >60 mL/min   GFR calc Af Amer >60 >60 mL/min    Comment: (NOTE) The eGFR has been calculated using the CKD EPI equation. This calculation has not been validated in all clinical situations. eGFR's persistently <60 mL/min signify possible Chronic Kidney Disease.   CBC     Status: Abnormal   Collection Time: 09/03/15  6:00 AM  Result Value Ref Range   WBC 3.9 3.8 - 10.6 K/uL   RBC 4.12 (L) 4.40 - 5.90 MIL/uL   Hemoglobin 12.5 (L) 13.0 - 18.0 g/dL   HCT 37.3 (L) 40.0 - 52.0 %   MCV 90.6 80.0 - 100.0 fL   MCH 30.3 26.0 - 34.0 pg   MCHC 33.5 32.0 - 36.0 g/dL   RDW 13.6 11.5 - 14.5 %   Platelets 168 150 - 440 K/uL  Glucose, capillary     Status: Abnormal   Collection Time: 09/03/15  7:26 AM  Result Value Ref Range   Glucose-Capillary 108 (H) 65 - 99 mg/dL   Comment 1 Notify RN   Glucose, capillary     Status: Abnormal   Collection Time: 09/03/15 11:12 AM  Result Value Ref Range   Glucose-Capillary 234 (H) 65 - 99 mg/dL   Comment 1 Notify RN   Glucose, capillary     Status: Abnormal   Collection Time: 09/03/15 12:21 PM  Result Value Ref Range   Glucose-Capillary 216 (H) 65 - 99 mg/dL  Glucose, capillary     Status: Abnormal   Collection Time:  09/03/15  5:21 PM  Result Value Ref Range   Glucose-Capillary 196 (H) 65 - 99 mg/dL  Glucose, capillary     Status: Abnormal   Collection Time: 09/03/15  7:59 PM  Result Value Ref Range   Glucose-Capillary 135 (H) 65 - 99 mg/dL  Glucose, capillary     Status: Abnormal   Collection Time: 09/04/15  7:37 AM  Result Value Ref Range   Glucose-Capillary 129 (H) 65 - 99 mg/dL  Glucose, capillary     Status: Abnormal   Collection Time: 09/04/15 11:47  AM  Result Value Ref Range   Glucose-Capillary 185 (H) 65 - 99 mg/dL  Glucose, capillary     Status: Abnormal   Collection Time: 09/04/15  4:36 PM  Result Value Ref Range   Glucose-Capillary 113 (H) 65 - 99 mg/dL    Current Facility-Administered Medications  Medication Dose Route Frequency Provider Last Rate Last Dose  . 0.9 %  sodium chloride infusion  250 mL Intravenous Once Gonzella Lex, MD   250 mL at 09/01/15 0815  . 0.9 %  sodium chloride infusion   Intravenous Continuous Theodoro Grist, MD 100 mL/hr at 09/04/15 1507    . acetaminophen (TYLENOL) tablet 650 mg  650 mg Oral Q6H PRN Baxter Hire, MD       Or  . acetaminophen (TYLENOL) suppository 650 mg  650 mg Rectal Q6H PRN Baxter Hire, MD      . apixaban Arne Cleveland) tablet 10 mg  10 mg Oral BID Loleta Dicker, RPH   10 mg at 09/04/15 4166  . [START ON 09/09/2015] apixaban (ELIQUIS) tablet 5 mg  5 mg Oral BID Crystal G Dorise Bullion, RPH      . aspirin EC tablet 81 mg  81 mg Oral Daily Baxter Hire, MD   81 mg at 09/04/15 0630  . atorvastatin (LIPITOR) tablet 10 mg  10 mg Oral Daily Baxter Hire, MD   10 mg at 09/04/15 0926  . cholecalciferol (VITAMIN D) tablet 800 Units  800 Units Oral Daily Baxter Hire, MD   800 Units at 09/04/15 [phone]  . clonazePAM (KLONOPIN) tablet 1 mg  1 mg Oral QHS Baxter Hire, MD   1 mg at 09/03/15 2120  . diltiazem (CARDIZEM CD) 24 hr capsule 120 mg  120 mg Oral Daily Baxter Hire, MD   120 mg at 09/04/15 0932  . feeding supplement (GLUCERNA  SHAKE) (GLUCERNA SHAKE) liquid 237 mL  237 mL Oral TID WC Baxter Hire, MD   237 mL at 09/04/15 0800  . finasteride (PROSCAR) tablet 5 mg  5 mg Oral Daily Baxter Hire, MD   5 mg at 09/04/15 3557  . hydrALAZINE (APRESOLINE) tablet 25 mg  25 mg Oral 4 times per day Baxter Hire, MD   25 mg at 09/04/15 0617  . insulin aspart (novoLOG) injection 0-9 Units  0-9 Units Subcutaneous TID WC Theodoro Grist, MD   2 Units at 09/04/15 1200  . insulin aspart (novoLOG) injection 5 Units  5 Units Subcutaneous TID WC Theodoro Grist, MD   5 Units at 09/04/15 1200  . insulin glargine (LANTUS) injection 17 Units  17 Units Subcutaneous QHS Theodoro Grist, MD      . levofloxacin (LEVAQUIN) tablet 750 mg  750 mg Oral Daily Theodoro Grist, MD   750 mg at 09/04/15 0927  . lisinopril (PRINIVIL,ZESTRIL) tablet 10 mg  10 mg Oral Daily Baxter Hire, MD   10 mg at 09/04/15 3220  . metoprolol succinate (TOPROL-XL) 24 hr tablet 100 mg  100 mg Oral BID Baxter Hire, MD   100 mg at 09/04/15 2542  . mirtazapine (REMERON) tablet 45 mg  45 mg Oral QHS Gonzella Lex, MD   45 mg at 09/03/15 2120  . OLANZapine zydis (ZYPREXA) disintegrating tablet 30 mg  30 mg Oral QHS Baxter Hire, MD   30 mg at 09/03/15 2121  . ondansetron (ZOFRAN) tablet 4 mg  4 mg Oral Q6H PRN Baxter Hire, MD      .  polyethylene glycol (MIRALAX / GLYCOLAX) packet 17 g  17 g Oral Daily Baxter Hire, MD   17 g at 09/03/15 6144  . sodium chloride 0.9 % injection 10 mL  10 mL Intravenous Q12H Loletha Grayer, MD   10 mL at 09/04/15 0927  . tamsulosin (FLOMAX) capsule 0.4 mg  0.4 mg Oral QHS Baxter Hire, MD   0.4 mg at 09/03/15 2121    Musculoskeletal: Strength & Muscle Tone: decreased Gait & Station: ataxic Patient leans: N/A  Psychiatric Specialty Exam: Review of Systems  Constitutional: Positive for malaise/fatigue.  HENT: Negative.   Eyes: Negative.   Respiratory: Negative.   Cardiovascular: Negative.   Gastrointestinal: Negative.    Musculoskeletal: Negative.   Skin: Negative.   Neurological: Positive for weakness.  Psychiatric/Behavioral: Positive for memory loss. Negative for depression, suicidal ideas, hallucinations and substance abuse. The patient is not nervous/anxious and does not have insomnia.     Blood pressure 105/64, pulse 90, temperature 98.4 F (36.9 C), temperature source Oral, resp. rate 19, height 6' (1.829 m), weight 97.841 kg (215 lb 11.2 oz), SpO2 99 %.Body mass index is 29.25 kg/(m^2).  General Appearance: Disheveled  Eye Sport and exercise psychologist::  Fair  Speech:  Slow  Volume:  Decreased  Mood:  Euthymic  Affect:  Flat  Thought Process:  Circumstantial  Orientation:  Full (Time, Place, and Person)  Thought Content:  Negative  Suicidal Thoughts:  No  Homicidal Thoughts:  No  Memory:  Immediate;   Fair Recent;   Fair Remote;   Fair  Judgement:  Fair  Insight:  Fair  Psychomotor Activity:  Decreased  Concentration:  Fair  Recall:  [employer] of Knowledge:Fair  Language: Fair  Akathisia:  No  Handed:  Right  AIMS (if indicated):     Assets:  Desire for Improvement Financial Resources/Insurance Housing Resilience Social Support  ADL's:  Intact  Cognition: Impaired,  Mild  Sleep:      Treatment Plan Summary: Medication management and Plan Patient is currently still on Remeron and Zyprexa as management for his acute symptoms. He has been off of ECT now for a week and appears to be holding on to the benefits that he got. Tomorrow I anticipate we will transfer him back down to the psychiatry service. I will be calling his wife tonight to discuss the treatment plan going forward with her. Would not change any medicine for now.  Disposition: Recommend psychiatric Inpatient admission when medically cleared.  [name] 09/04/2015 5:35 PM

## 2015-09-04 NOTE — Progress Notes (Signed)
[facility] - [place] at [facility]   PATIENT NAME: Christian [name]    MR#:  [id number]  DATE OF BIRTH:  1950-11-30  SUBJECTIVE: I feel pretty good CHIEF COMPLAINT:  No chief complaint on file.  Patient is 64 year old African-American male with history of depression and resultant anorexia, weight loss, malnutrition who was admitted to behavioral health unit on 08/29/2015 developed dizziness, lightheadedness and was found to have orthostatic hypotension and was admitted to the medical unit. He denies any significant discomfort, no pain . Orthostatic vital signs revealed a significant drop of blood pressure from 145 systolic to 84 on initial arrival. Patient was given some IV fluids. However, his blood pressure remains labile whenever he gets up Oxygen saturations were found to be low in 70s on room air at rest and D-dimer was found to be elevated , so patient underwent CT angiogram of chest , which revealed pulmonary embolism, now on Eliquis. Echocardiogram was unremarkable, no pulmonary hypertension. Doppler ultrasound of lower extremities showed bilateral DVT. Patient feels comfortable, unhappy of having just gown on,  feels naked. Is open for suggestion of calling wife and getting his cloth brought in    Review of Systems  Constitutional: Negative for fever, chills and weight loss.  HENT: Negative for congestion.   Eyes: Negative for blurred vision and double vision.  Respiratory: Positive for cough and shortness of breath. Negative for sputum production and wheezing.   Cardiovascular: Negative for chest pain, palpitations, orthopnea, leg swelling and PND.  Gastrointestinal: Negative for nausea, vomiting, abdominal pain, diarrhea, constipation and blood in stool.  Genitourinary: Negative for dysuria, urgency, frequency and hematuria.  Musculoskeletal: Negative for falls.  Neurological: Negative for dizziness, tremors, focal weakness and headaches.   Endo/Heme/Allergies: Does not bruise/bleed easily.  Psychiatric/Behavioral: Negative for depression. The patient does not have insomnia.     VITAL SIGNS: Blood pressure 74/59, pulse 100, temperature 98.4 F (36.9 C), temperature source Oral, resp. rate 19, height 6' (1.829 m), weight 97.841 kg (215 lb 11.2 oz), SpO2 99 %.  PHYSICAL EXAMINATION:   GENERAL:  64 y.o.-year-old patient lying in the bed with no acute distress. Intermittently closes his eyes and seemed to be drifting to sleep, but overall much more interactive and able to open up EYES: Pupils equal, round, reactive to light and accommodation. No scleral icterus. Extraocular muscles intact.  HEENT: Head atraumatic, normocephalic. Oropharynx and nasopharynx clear.  NECK:  Supple, no jugular venous distention. No thyroid enlargement, no tenderness.  LUNGS: Normal breath sounds bilaterally, no wheezing, rales,rhonchi or crepitation. No use of accessory muscles of respiration.  CARDIOVASCULAR: S1, S2 normal. No murmurs, rubs, or gallops.  ABDOMEN: Soft, nontender, nondistended. Bowel sounds present. No organomegaly or mass.  EXTREMITIES: No pedal edema, cyanosis, or clubbing.  NEUROLOGIC: Cranial nerves II through XII are intact. Muscle strength 5/5 in all extremities. Sensation intact. Gait not checked.  PSYCHIATRIC: The patient is alert and oriented x 3.  SKIN: No obvious rash, lesion, or ulcer. Edema. Superficial nodularity of lower extremities, likely superficial femoral phlebitis left lower extremity more than right lower extremity  ORDERS/RESULTS REVIEWED:   CBC  Recent Labs Lab 08/31/15 1132 09/03/15 0600  WBC 5.0 3.9  HGB 12.8* 12.5*  HCT 38.9* 37.3*  PLT 172 168  MCV 90.3 90.6  MCH 29.8 30.3  MCHC 33.0 33.5  RDW 13.9 13.6  LYMPHSABS 1.1  --   MONOABS 0.5  --   EOSABS 0.1  --   BASOSABS 0.0  --     ------------------------------------------------------------------------------------------------------------------  Chemistries   Recent Labs Lab 08/31/15 1132 09/03/15 0600  NA 138  --   K 4.4  --   CL 104  --   CO2 27  --   GLUCOSE 208*  --   BUN 21*  --   CREATININE 1.01 0.83  CALCIUM 9.3  --   AST 15  --   ALT 20  --   ALKPHOS 62  --   BILITOT 0.8  --    ------------------------------------------------------------------------------------------------------------------ estimated creatinine clearance is 111.9 mL/min (by C-G formula based on Cr of 0.83). ------------------------------------------------------------------------------------------------------------------ No results for input(s): TSH, T4TOTAL, T3FREE, THYROIDAB in the last 72 hours.  Invalid input(s): FREET3  Cardiac Enzymes  Recent Labs Lab 08/31/15 1804 09/01/15 0500  TROPONINI 0.06* 0.03   ------------------------------------------------------------------------------------------------------------------ Invalid input(s): POCBNP ---------------------------------------------------------------------------------------------------------------  RADIOLOGY: US Venous Img Lower Bilateral  09/03/2015   CLINICAL DATA:  Pulmonary thromboembolism.  EXAM: BILATERAL LOWER EXTREMITY VENOUS DUPLEX ULTRASOUND  TECHNIQUE: Doppler venous assessment of the bilateral lower extremity deep venous system was performed, including characterization of spectral flow, compressibility, and phasicity.  COMPARISON:  None.  FINDINGS: In the right lower extremity, the common femoral and popliteal veins are compressible. There is noncompressible thrombus in the mid femoral vein. The proximal and distal femoral vein are compressible. No evidence of calf vein DVT in the right lower extremity. Doppler analysis demonstrates respiratory phasicity. Augmentation was deferred.  In the left lower extremity, the common femoral and popliteal veins are  compressible. The femoral vein is noncompressible extending from its origin to the mid to distal thigh. The distal femoral vein is compressible. Doppler analysis demonstrates respiratory phasicity. No calf vein DVT.  IMPRESSION: The study is positive for bilateral lower extremity femoral vein DVT as described above.   Electronically Signed   By: Jolaine Click M.D.   On: 09/03/2015 17:18    EKG:  Orders placed or performed during the hospital encounter of 08/27/15  . EKG 12-Lead  . EKG 12-Lead    ASSESSMENT AND PLAN:  Active Problems:   Syncope, near   Labile hypertension   Pulmonary embolism with acute cor pulmonale (HCC)   Dyspnea   Orthostatic hypotension   Bipolar disorder, in partial remission, most recent episode depressed (HCC)   Bilateral pulmonary embolism (HCC) 1. Near syncope, thought to be due to orthostatic hypotension, but most likely due to acute pulmonary embolism,  patient's blood pressure remains labile. We will get patient back on IV fluids. Unremarkable carotid ultrasound , , echocardiogram  2. Acute respiratory failure with hypoxia short lived likely due to acute pulmonary embolism, continue patient on oxygen therapy as needed as well as Eliquis, no new changes 3. Orthostatic hypotension as above. Resume IV fluids as patient again developed hypotension whenever stood up, the systolic blood pressure as low as 70s,  following patient's orthostatic vital signs closely. Etiology was thought to be due to diabetes as well as poor oral intake, hemoglobin A1c was found to be 7.5. . Now it seems that this hypotension episode may been related to acute pulmonary embolism, hemorrhoid, no neck instability, which is still present, questionable recurrent pulmonary embolism 4. Elevated troponin. Echocardiogram was unremarkable, appreciate cardiology input, consider outpatient nuclear stress test according to cardiologist, but unlikely in the next few weeks due to acute pulmonary embolism  episode, follow up with cardiologist as outpatient 5. Diabetes mellitus, hemoglobin A1c  7.5. Continue outpatient medications. Advance insulin Lantus even higher today and continue preprandial short-acting insulin, may advance dose , blood glucose  levels are between 120s to 200s, advised good oral fluid intake, stable 6. Somnolence,  etiology is likely depression, unremarkable ABGs .  7. Bipolar disorder, depression, patient is to return back to behavioral medicine as soon as workup is complete, likely after vascular surgery consultation. Main concern mains off intermittent orthostatic hypotension, patient is back on IV fluids at present, although he is oral intake has improved.  8. Left upper lobe nodule, unclear etiology, questionable infection, continue patient on levofloxacin orally 9. Bilateral DVT, get the vascular surgery involved for further recommendations. Questionable IVC filter placement as patient continues to have intermittent hypotension episodes, although no hypoxic episodes.   Management plans discussed with the patient, family and they are in agreement.   DRUG ALLERGIES: No Known Allergies  CODE STATUS:     Code Status Orders        Start     Ordered   08/31/15 1703  Full code   Continuous     08/31/15 1702      TOTAL TIME TAKING CARE OF THIS PATIENT: 40 minutes.  Left message to Dr. Toni Amend. Emotional support was provided to patient  Kelvin Burpee M.D on 09/04/2015 at 2:51 PM  Between 7am to 6pm - Pager - [phone]  After 6pm go to [email or web address] - password [email or web address] [facility]  [facility]  Office  [phone]  CC: Primary care physician; Leanor Rubenstein, MD

## 2015-09-05 ENCOUNTER — Inpatient Hospital Stay
Admission: EM | Admit: 2015-09-05 | Discharge: 2015-09-18 | DRG: 885 | Payer: Commercial Managed Care - HMO | Source: Intra-hospital | Attending: Psychiatry | Admitting: Psychiatry

## 2015-09-05 DIAGNOSIS — F333 Major depressive disorder, recurrent, severe with psychotic symptoms: Secondary | ICD-10-CM | POA: Diagnosis not present

## 2015-09-05 DIAGNOSIS — Z86718 Personal history of other venous thrombosis and embolism: Secondary | ICD-10-CM | POA: Diagnosis not present

## 2015-09-05 DIAGNOSIS — R45851 Suicidal ideations: Secondary | ICD-10-CM | POA: Diagnosis not present

## 2015-09-05 DIAGNOSIS — Z86711 Personal history of pulmonary embolism: Secondary | ICD-10-CM

## 2015-09-05 DIAGNOSIS — E119 Type 2 diabetes mellitus without complications: Secondary | ICD-10-CM

## 2015-09-05 DIAGNOSIS — R748 Abnormal levels of other serum enzymes: Secondary | ICD-10-CM | POA: Diagnosis present

## 2015-09-05 DIAGNOSIS — F319 Bipolar disorder, unspecified: Principal | ICD-10-CM | POA: Diagnosis present

## 2015-09-05 DIAGNOSIS — Z8249 Family history of ischemic heart disease and other diseases of the circulatory system: Secondary | ICD-10-CM | POA: Diagnosis not present

## 2015-09-05 DIAGNOSIS — R7989 Other specified abnormal findings of blood chemistry: Secondary | ICD-10-CM | POA: Diagnosis not present

## 2015-09-05 DIAGNOSIS — R400 Somnolence: Secondary | ICD-10-CM | POA: Diagnosis not present

## 2015-09-05 DIAGNOSIS — Z809 Family history of malignant neoplasm, unspecified: Secondary | ICD-10-CM | POA: Diagnosis not present

## 2015-09-05 DIAGNOSIS — G4733 Obstructive sleep apnea (adult) (pediatric): Secondary | ICD-10-CM | POA: Diagnosis present

## 2015-09-05 DIAGNOSIS — R778 Other specified abnormalities of plasma proteins: Secondary | ICD-10-CM

## 2015-09-05 DIAGNOSIS — Z794 Long term (current) use of insulin: Secondary | ICD-10-CM

## 2015-09-05 DIAGNOSIS — Z79899 Other long term (current) drug therapy: Secondary | ICD-10-CM

## 2015-09-05 DIAGNOSIS — Z818 Family history of other mental and behavioral disorders: Secondary | ICD-10-CM | POA: Diagnosis not present

## 2015-09-05 DIAGNOSIS — I951 Orthostatic hypotension: Secondary | ICD-10-CM | POA: Diagnosis not present

## 2015-09-05 DIAGNOSIS — M1A9XX0 Chronic gout, unspecified, without tophus (tophi): Secondary | ICD-10-CM | POA: Diagnosis present

## 2015-09-05 DIAGNOSIS — I10 Essential (primary) hypertension: Secondary | ICD-10-CM | POA: Diagnosis present

## 2015-09-05 DIAGNOSIS — E860 Dehydration: Secondary | ICD-10-CM | POA: Diagnosis present

## 2015-09-05 DIAGNOSIS — I2699 Other pulmonary embolism without acute cor pulmonale: Secondary | ICD-10-CM | POA: Diagnosis not present

## 2015-09-05 DIAGNOSIS — I82413 Acute embolism and thrombosis of femoral vein, bilateral: Secondary | ICD-10-CM | POA: Diagnosis present

## 2015-09-05 DIAGNOSIS — Z820 Family history of epilepsy and other diseases of the nervous system: Secondary | ICD-10-CM | POA: Diagnosis not present

## 2015-09-05 DIAGNOSIS — F3131 Bipolar disorder, current episode depressed, mild: Secondary | ICD-10-CM | POA: Diagnosis present

## 2015-09-05 DIAGNOSIS — J9601 Acute respiratory failure with hypoxia: Secondary | ICD-10-CM

## 2015-09-05 DIAGNOSIS — M311 Thrombotic microangiopathy: Secondary | ICD-10-CM | POA: Diagnosis not present

## 2015-09-05 DIAGNOSIS — Z7982 Long term (current) use of aspirin: Secondary | ICD-10-CM

## 2015-09-05 DIAGNOSIS — F3130 Bipolar disorder, current episode depressed, mild or moderate severity, unspecified: Secondary | ICD-10-CM | POA: Diagnosis present

## 2015-09-05 DIAGNOSIS — J9621 Acute and chronic respiratory failure with hypoxia: Secondary | ICD-10-CM

## 2015-09-05 DIAGNOSIS — Z915 Personal history of self-harm: Secondary | ICD-10-CM

## 2015-09-05 DIAGNOSIS — I739 Peripheral vascular disease, unspecified: Secondary | ICD-10-CM | POA: Diagnosis not present

## 2015-09-05 DIAGNOSIS — E11319 Type 2 diabetes mellitus with unspecified diabetic retinopathy without macular edema: Secondary | ICD-10-CM

## 2015-09-05 DIAGNOSIS — R55 Syncope and collapse: Secondary | ICD-10-CM | POA: Diagnosis not present

## 2015-09-05 DIAGNOSIS — Z8261 Family history of arthritis: Secondary | ICD-10-CM | POA: Diagnosis not present

## 2015-09-05 DIAGNOSIS — L89619 Pressure ulcer of right heel, unspecified stage: Secondary | ICD-10-CM | POA: Diagnosis present

## 2015-09-05 DIAGNOSIS — F312 Bipolar disorder, current episode manic severe with psychotic features: Secondary | ICD-10-CM | POA: Diagnosis present

## 2015-09-05 DIAGNOSIS — F82 Specific developmental disorder of motor function: Secondary | ICD-10-CM | POA: Diagnosis present

## 2015-09-05 DIAGNOSIS — T50901A Poisoning by unspecified drugs, medicaments and biological substances, accidental (unintentional), initial encounter: Secondary | ICD-10-CM | POA: Diagnosis present

## 2015-09-05 DIAGNOSIS — F315 Bipolar disorder, current episode depressed, severe, with psychotic features: Secondary | ICD-10-CM | POA: Diagnosis not present

## 2015-09-05 LAB — GLUCOSE, CAPILLARY
Glucose-Capillary: 132 mg/dL — ABNORMAL HIGH (ref 65–99)
Glucose-Capillary: 145 mg/dL — ABNORMAL HIGH (ref 65–99)
Glucose-Capillary: 141 mg/dL — ABNORMAL HIGH (ref 65–99)
Glucose-Capillary: 146 mg/dL — ABNORMAL HIGH (ref 65–99)

## 2015-09-05 MED ORDER — ACETAMINOPHEN 325 MG PO TABS
650.0000 mg | ORAL_TABLET | Freq: Four times a day (QID) | ORAL | Status: DC | PRN
  Administered 2015-09-08 – 2015-09-09 (×2): 650 mg via ORAL
  Filled 2015-09-05 (×2): qty 2

## 2015-09-05 MED ORDER — MIDODRINE HCL 5 MG PO TABS: 5.0000 mg | ORAL_TABLET | Freq: Three times a day (TID) | ORAL | Status: DC

## 2015-09-05 MED ORDER — APIXABAN 5 MG PO TABS
5.0000 mg | ORAL_TABLET | Freq: Two times a day (BID) | ORAL | Status: DC
  Administered 2015-09-06 – 2015-09-18 (×20): 5 mg via ORAL
  Filled 2015-09-05 (×19): qty 1

## 2015-09-05 MED ORDER — POLYETHYLENE GLYCOL 3350 17 G PO PACK
17.0000 g | PACK | Freq: Every day | ORAL | Status: DC
  Administered 2015-09-05 – 2015-09-18 (×8): 17 g via ORAL
  Filled 2015-09-05 (×7): qty 1

## 2015-09-05 MED ORDER — INSULIN ASPART 100 UNIT/ML ~~LOC~~ SOLN: 5.0000 [IU] | Freq: Three times a day (TID) | SUBCUTANEOUS | Status: DC

## 2015-09-05 MED ORDER — MIRTAZAPINE 30 MG PO TABS
45.0000 mg | ORAL_TABLET | Freq: Every day | ORAL | Status: DC
  Administered 2015-09-05 – 2015-09-17 (×13): 45 mg via ORAL
  Filled 2015-09-05 (×13): qty 1

## 2015-09-05 MED ORDER — ATORVASTATIN CALCIUM 20 MG PO TABS
10.0000 mg | ORAL_TABLET | Freq: Every day | ORAL | Status: DC
  Administered 2015-09-06 – 2015-09-17 (×12): 10 mg via ORAL
  Filled 2015-09-05 (×2): qty 1
  Filled 2015-09-05: qty 2
  Filled 2015-09-05 (×4): qty 1
  Filled 2015-09-05: qty 2
  Filled 2015-09-05: qty 1
  Filled 2015-09-05: qty 2
  Filled 2015-09-05 (×2): qty 1

## 2015-09-05 MED ORDER — MAGNESIUM HYDROXIDE 400 MG/5ML PO SUSP: 30.0000 mL | Freq: Every day | ORAL | Status: DC | PRN

## 2015-09-05 MED ORDER — ALLOPURINOL 100 MG PO TABS
300.0000 mg | ORAL_TABLET | Freq: Every day | ORAL | Status: DC
  Administered 2015-09-05 – 2015-09-18 (×14): 300 mg via ORAL
  Filled 2015-09-05 (×14): qty 3

## 2015-09-05 MED ORDER — APIXABAN 5 MG PO TABS
10.0000 mg | ORAL_TABLET | Freq: Two times a day (BID) | ORAL | Status: AC
  Administered 2015-09-05 – 2015-09-08 (×7): 10 mg via ORAL
  Filled 2015-09-05 (×9): qty 2

## 2015-09-05 MED ORDER — MIDODRINE HCL 5 MG PO TABS
5.0000 mg | ORAL_TABLET | Freq: Three times a day (TID) | ORAL | Status: DC
  Administered 2015-09-06 – 2015-09-18 (×34): 5 mg via ORAL
  Filled 2015-09-05 (×37): qty 1

## 2015-09-05 MED ORDER — INSULIN ASPART 100 UNIT/ML ~~LOC~~ SOLN
0.0000 [IU] | Freq: Three times a day (TID) | SUBCUTANEOUS | Status: DC
  Administered 2015-09-06: 3 [IU] via SUBCUTANEOUS
  Administered 2015-09-06 – 2015-09-07 (×2): 2 [IU] via SUBCUTANEOUS
  Administered 2015-09-07 – 2015-09-08 (×3): 3 [IU] via SUBCUTANEOUS
  Administered 2015-09-08 – 2015-09-09 (×2): 2 [IU] via SUBCUTANEOUS
  Administered 2015-09-09 (×2): 3 [IU] via SUBCUTANEOUS
  Administered 2015-09-10: 11 [IU] via SUBCUTANEOUS
  Administered 2015-09-11 (×2): 2 [IU] via SUBCUTANEOUS
  Administered 2015-09-11: 5 [IU] via SUBCUTANEOUS
  Administered 2015-09-12: 15 [IU] via SUBCUTANEOUS
  Administered 2015-09-12: 5 [IU] via SUBCUTANEOUS
  Administered 2015-09-13: 3 [IU] via SUBCUTANEOUS
  Administered 2015-09-13: 0 [IU] via SUBCUTANEOUS
  Administered 2015-09-13 – 2015-09-14 (×2): 3 [IU] via SUBCUTANEOUS
  Administered 2015-09-14: 5 [IU] via SUBCUTANEOUS
  Administered 2015-09-15: 2 [IU] via SUBCUTANEOUS
  Administered 2015-09-15: 3 [IU] via SUBCUTANEOUS
  Administered 2015-09-15: 2 [IU] via SUBCUTANEOUS
  Administered 2015-09-16: 5 [IU] via SUBCUTANEOUS
  Administered 2015-09-16: 3 [IU] via SUBCUTANEOUS
  Administered 2015-09-16: 2 [IU] via SUBCUTANEOUS
  Administered 2015-09-17: 3 [IU] via SUBCUTANEOUS
  Administered 2015-09-18: 2 [IU] via SUBCUTANEOUS
  Administered 2015-09-18: 3 [IU] via SUBCUTANEOUS
  Filled 2015-09-05: qty 2
  Filled 2015-09-05: qty 3
  Filled 2015-09-05: qty 4
  Filled 2015-09-05 (×3): qty 2
  Filled 2015-09-05: qty 1
  Filled 2015-09-05: qty 2

## 2015-09-05 MED ORDER — CLONAZEPAM 1 MG PO TABS
1.0000 mg | ORAL_TABLET | Freq: Every day | ORAL | Status: DC
  Administered 2015-09-05 – 2015-09-17 (×13): 1 mg via ORAL
  Filled 2015-09-05 (×13): qty 1

## 2015-09-05 MED ORDER — APIXABAN 5 MG PO TABS: 10.0000 mg | ORAL_TABLET | Freq: Two times a day (BID) | ORAL | Status: DC

## 2015-09-05 MED ORDER — INSULIN ASPART 100 UNIT/ML ~~LOC~~ SOLN
4.0000 [IU] | Freq: Three times a day (TID) | SUBCUTANEOUS | Status: DC
  Administered 2015-09-06 – 2015-09-14 (×24): 4 [IU] via SUBCUTANEOUS
  Administered 2015-09-15: 12:00:00 via SUBCUTANEOUS
  Administered 2015-09-15 – 2015-09-18 (×8): 4 [IU] via SUBCUTANEOUS
  Filled 2015-09-05 (×16): qty 4

## 2015-09-05 MED ORDER — INSULIN ASPART 100 UNIT/ML ~~LOC~~ SOLN: 0.0000 [IU] | Freq: Three times a day (TID) | SUBCUTANEOUS | Status: DC

## 2015-09-05 MED ORDER — LEVOFLOXACIN 500 MG PO TABS
750.0000 mg | ORAL_TABLET | Freq: Every day | ORAL | Status: AC
  Administered 2015-09-05 – 2015-09-07 (×3): 750 mg via ORAL
  Filled 2015-09-05 (×3): qty 2

## 2015-09-05 MED ORDER — INSULIN ASPART 100 UNIT/ML ~~LOC~~ SOLN
0.0000 [IU] | Freq: Every day | SUBCUTANEOUS | Status: DC
  Administered 2015-09-07 – 2015-09-09 (×2): 2 [IU] via SUBCUTANEOUS
  Administered 2015-09-10: 3 [IU] via SUBCUTANEOUS
  Administered 2015-09-11: 2 [IU] via SUBCUTANEOUS
  Administered 2015-09-12: 3 [IU] via SUBCUTANEOUS
  Administered 2015-09-14: 2 [IU] via SUBCUTANEOUS
  Filled 2015-09-05 (×2): qty 2
  Filled 2015-09-05 (×2): qty 3
  Filled 2015-09-05: qty 2
  Filled 2015-09-05 (×2): qty 3
  Filled 2015-09-05: qty 1
  Filled 2015-09-05: qty 2

## 2015-09-05 MED ORDER — OLANZAPINE 10 MG PO TABS
30.0000 mg | ORAL_TABLET | Freq: Every day | ORAL | Status: DC
  Administered 2015-09-05 – 2015-09-17 (×13): 30 mg via ORAL
  Filled 2015-09-05 (×13): qty 3

## 2015-09-05 MED ORDER — TRAZODONE HCL 50 MG PO TABS
25.0000 mg | ORAL_TABLET | Freq: Every day | ORAL | Status: DC
  Administered 2015-09-05 – 2015-09-13 (×9): 25 mg via ORAL
  Filled 2015-09-05 (×7): qty 1
  Filled 2015-09-05: qty 2
  Filled 2015-09-05: qty 1

## 2015-09-05 MED ORDER — MIDODRINE HCL 5 MG PO TABS
5.0000 mg | ORAL_TABLET | Freq: Three times a day (TID) | ORAL | Status: DC
  Administered 2015-09-05 (×2): 5 mg via ORAL
  Filled 2015-09-05 (×2): qty 1

## 2015-09-05 MED ORDER — LEVOFLOXACIN 750 MG PO TABS: 750.0000 mg | ORAL_TABLET | Freq: Every day | ORAL | Status: DC

## 2015-09-05 MED ORDER — INSULIN GLARGINE 100 UNIT/ML ~~LOC~~ SOLN
15.0000 [IU] | Freq: Every day | SUBCUTANEOUS | Status: DC
  Administered 2015-09-05 – 2015-09-17 (×13): 15 [IU] via SUBCUTANEOUS
  Filled 2015-09-05 (×16): qty 0.15

## 2015-09-05 MED ORDER — TAMSULOSIN HCL 0.4 MG PO CAPS
0.4000 mg | ORAL_CAPSULE | Freq: Every day | ORAL | Status: DC
  Administered 2015-09-06 – 2015-09-17 (×12): 0.4 mg via ORAL
  Filled 2015-09-05 (×12): qty 1

## 2015-09-05 MED ORDER — FINASTERIDE 5 MG PO TABS
5.0000 mg | ORAL_TABLET | Freq: Every day | ORAL | Status: DC
  Administered 2015-09-05 – 2015-09-18 (×14): 5 mg via ORAL
  Filled 2015-09-05 (×15): qty 1

## 2015-09-05 MED ORDER — ASPIRIN EC 81 MG PO TBEC
81.0000 mg | DELAYED_RELEASE_TABLET | Freq: Every day | ORAL | Status: DC
  Administered 2015-09-05 – 2015-09-18 (×14): 81 mg via ORAL
  Filled 2015-09-05 (×14): qty 1

## 2015-09-05 MED ORDER — ALUM & MAG HYDROXIDE-SIMETH 200-200-20 MG/5ML PO SUSP: 30.0000 mL | ORAL | Status: DC | PRN

## 2015-09-05 MED ORDER — APIXABAN 5 MG PO TABS: 5.0000 mg | ORAL_TABLET | Freq: Two times a day (BID) | ORAL | Status: DC

## 2015-09-05 NOTE — Care Management Note (Signed)
Case Management Note  Patient Details  Name: Christian [name] MRN: [id number] Date of Birth: 01-Jun-1951  Subjective/Objective:     Reviewed note from Dr. Toni Amend. Patient to transfer to [facility] when medically stable. It is anticipated that patient will be ready for transfer today             Action/Plan: BHU  Expected Discharge Date:                  Expected Discharge Plan:     In-House Referral:     Discharge planning Services     Post Acute Care Choice:    Choice offered to:     DME Arranged:    DME Agency:     HH Arranged:    HH Agency:     Status of Service:  In process, will continue to follow  Medicare Important Message Given:  Yes-fourth notification given Date Medicare IM Given:    Medicare IM give by:    Date Additional Medicare IM Given:    Additional Medicare Important Message give by:     If discussed at Long Length of Stay Meetings, dates discussed:    Additional Comments:  Marily Memos, RN 09/05/2015, 8:29 AM

## 2015-09-05 NOTE — Progress Notes (Signed)
ANTICOAGULATION CONSULT NOTE - Initial Consult  Pharmacy Consult for Apixaban Indication: pulmonary embolus  No Known Allergies  Patient Measurements: Height: 6' (182.9 cm) Weight: 215 lb (97.523 kg) IBW/kg (Calculated) : 77.6 Heparin Dosing Weight:   Vital Signs: Temp: 98.1 F (36.7 C) (10/07 1827) Temp Source: Oral (10/07 1827) BP: 155/94 mmHg (10/07 1853) Pulse Rate: 101 (10/07 1853)  Labs:  Recent Labs  09/03/15 0600  HGB 12.5*  HCT 37.3*  PLT 168  CREATININE 0.83    Estimated Creatinine Clearance: 111.7 mL/min (by C-G formula based on Cr of 0.83).   Medical History: Past Medical History  Diagnosis Date  . Mood swings (HCC)   . Type II or unspecified type diabetes mellitus with unspecified complication, uncontrolled 06/02/2014  . Other and unspecified hyperlipidemia 06/02/2014  . OSA on CPAP 06/02/2014  . 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 Bipolar disorder (HCC)   . Hypertension   . Gout   . Bipolar 1 disorder (HCC)     Medications:  Prescriptions prior to admission  Medication Sig Dispense Refill Last Dose  . allopurinol (ZYLOPRIM) 300 MG tablet Take 1 tablet (300 mg total) by mouth daily. For gout   Past Month at Unknown time  . apixaban (ELIQUIS) 5 MG TABS tablet Take 2 tablets (10 mg total) by mouth 2 (two) times daily. 28 tablet 0   . [START ON 09/09/2015] apixaban (ELIQUIS) 5 MG TABS tablet Take 1 tablet (5 mg total) by mouth 2 (two) times daily. 60 tablet 6   . aspirin EC 81 MG tablet Take 81 mg by mouth daily.   Past Month at Unknown time  . atorvastatin (LIPITOR) 10 MG tablet Take 1 tablet (10 mg total) by mouth daily. For high cholesterol   Past Month at Unknown time  . cholecalciferol (VITAMIN D) 400 UNITS TABS tablet Take 800 Units by mouth daily.   Past Month at Unknown time  . clonazePAM (KLONOPIN) 1 MG tablet Take 1 tablet (1 mg total) by mouth at bedtime. 30 tablet 0   . docusate sodium (COLACE) 100  MG capsule Take 2 capsules (200 mg total) by mouth 2 (two) times daily. 10 capsule 0   . feeding supplement, GLUCERNA SHAKE, (GLUCERNA SHAKE) LIQD Take 237 mLs by mouth 3 (three) times daily with meals.  0   . finasteride (PROSCAR) 5 MG tablet Take 1 tablet (5 mg total) by mouth daily. 1 tablet 0   . insulin aspart (NOVOLOG) 100 UNIT/ML injection Inject 0-9 Units into the skin 3 (three) times daily with meals. 10 mL 11   . insulin aspart (NOVOLOG) 100 UNIT/ML injection Inject 5 Units into the skin 3 (three) times daily with meals. 10 mL 11   . insulin aspart (NOVOLOG) 100 UNIT/ML injection Inject 0-9 Units into the skin 3 (three) times daily with meals. 10 mL 11   . insulin glargine (LANTUS) 100 UNIT/ML injection Inject 0.12 mLs (12 Units total) into the skin at bedtime. 10 mL 11   . levofloxacin (LEVAQUIN) 750 MG tablet Take 1 tablet (750 mg total) by mouth daily. Please continue for 3 more days to complete course 3 tablet 0   . midodrine (PROAMATINE) 5 MG tablet Take 1 tablet (5 mg total) by mouth 3 (three) times daily with meals. 90 tablet 6   . mirtazapine (REMERON) 30 MG tablet Take 30 mg by mouth at bedtime.   07/24/2015 at Unknown time  . OLANZapine zydis (ZYPREXA) 15 MG  disintegrating tablet Take 2 tablets (30 mg total) by mouth at bedtime. 1 tablet 0   . ondansetron (ZOFRAN) 4 MG tablet Take 1 tablet (4 mg total) by mouth every 6 (six) hours as needed for nausea. 20 tablet 0   . polyethylene glycol (MIRALAX / GLYCOLAX) packet Take 17 g by mouth daily. 14 each 0   . tamsulosin (FLOMAX) 0.4 MG CAPS capsule Take 1 capsule (0.4 mg total) by mouth at bedtime. 30 capsule 9   . traZODone (DESYREL) 50 MG tablet Take 0.5 tablets (25 mg total) by mouth 3 (three) times daily. (Patient taking differently: Take 200 mg by mouth at bedtime. )       Assessment: CrCl = 111.7 ml/min Pt was started on Apixaban 10 mg PO BID X 7 on 10/4.  Pt was d/c'd on 10/7 AM but has returned this evening for bipolar  disorder.  Goal of Therapy:  Resolution of PE    Plan:  Eliquis 10 mg PO BID X 7D started on 10/4.  Will continue with Eliquis 10 mg PO BID for 7 more doses (through 10/10). Will start Eliquis  PO BID on 10/11.   Shawanna Zanders D 09/05/2015,10:05 PM

## 2015-09-05 NOTE — Progress Notes (Signed)
Notified Eula Listen of patient's recent blood pressures being low and current pressure is till systolically in the 100's this morning. Held all blood pressure meds per Dunn's order. Cardiology will see and change medications.

## 2015-09-05 NOTE — Discharge Summary (Signed)
[facility] [place] at [facility]   PATIENT NAME: Christian [name]    MR#:  [id number]  DATE OF BIRTH:  Apr 23, 1951  DATE OF ADMISSION:  08/31/2015 ADMITTING PHYSICIAN: Gracelyn Nurse, MD  DATE OF DISCHARGE: No discharge date for patient encounter.  PRIMARY CARE PHYSICIAN: Leanor Rubenstein, MD     ADMISSION DIAGNOSIS:  Afib  DISCHARGE DIAGNOSIS:  Principal Problem:   Syncope, near Active Problems:   Pulmonary embolism with acute cor pulmonale (HCC)   Bilateral pulmonary embolism (HCC)   Acute respiratory failure with hypoxia (HCC)   Dyspnea   Orthostatic hypotension   Elevated troponin   Acute bilateral deep vein thrombosis (DVT) of femoral veins (HCC)   Labile hypertension   Bipolar disorder, in partial remission, most recent episode depressed (HCC)   Somnolence   Bipolar depression (HCC)   SECONDARY DIAGNOSIS:   Past Medical History  Diagnosis Date  . Mood swings (HCC)   . Type II or unspecified type diabetes mellitus with unspecified complication, uncontrolled 06/02/2014  . Other and unspecified hyperlipidemia 06/02/2014  . OSA on CPAP 06/02/2014  . 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 Bipolar disorder (HCC)   . Hypertension   . Gout   . Bipolar 1 disorder (HCC)     .pro HOSPITAL COURSE:  Patient is 64 year old African-American male with history of depression and resultant anorexia, weight loss, malnutrition who was admitted to behavioral health unit on 08/29/2015,  developed dizziness, lightheadedness and was found to have orthostatic hypotension and was admitted to the medical unit. Upon admission to medicine. Patient was noted to be in A. fib, RVR and hypoxic intermittently. CT scan of chest with IV contrast revealed a pulmonary embolism bilaterally, lower extremity Dopplers also showed the DVT bilaterally. Patient was seen by vascular surgeon, who recommended anticoagulation, but no IVC filter  placement. For Orthostatic hypotension. Patient received IV fluids. However, remained orthostatically hypotensive requiring some midodrine administration. For Atrial fibrillation. Patient was continued on metoprolol and Cardizem and his heart rate stabilized Discussion by problem 1. Near syncope, thought to be due to orthostatic hypotension, but most likely due to acute pulmonary embolism, patient's blood pressure remains labile. Initiate patient on midodrine and advance to 10 mg 3 times daily if needed. Unremarkable carotid ultrasound ,  echocardiogram . Patient was counseled about orthostatic hypotension dangerous and recommended to get up slowly.  2. Acute respiratory failure with hypoxia  due to acute pulmonary embolism, patient's oxygenation remains stable on room air at rest. However, may need to be reevaluated on exertion  3. Orthostatic hypotension , initiate patient on midodrine at 5 mg 3 times daily dose and advance to 10 mg 3 times daily dose if needed  4. Elevated troponin. Echocardiogram was unremarkable, appreciate cardiology input, consider outpatient nuclear stress test according to cardiologist, but unlikely in the next few weeks due to acute pulmonary embolism episode, follow up with cardiologist as outpatient 5. Diabetes mellitus, hemoglobin A1c 7.5. Continue outpatient medications. Continue advanced insulin Lantus doses  and continue preprandial short-acting insulin, much improved oral intake  6. Somnolence, etiology is likely depression, unremarkable ABGs .  7. Bipolar disorder, depression, patient is to return back to behavioral medicine today to be taken care by Dr. Toni Amend  8. Left upper lobe nodule, unclear etiology, questionable infection, continue patient on levofloxacin orally for 3 more days 9. Bilateral DVT, appreciate vascular surgery  recommendations. No need for IVC filter placement per vascular surgeon, continue  anticoagulation with Eliquis, loading dose, initially at  10 mg twice daily dose for 7 days, then 5 mg twice daily dose  DISCHARGE CONDITIONS:   Stable  CONSULTS OBTAINED:  Treatment Team:  Audery Amel, MD Antonieta Iba, MD  DRUG ALLERGIES:  No Known Allergies  DISCHARGE MEDICATIONS:   Current Discharge Medication List    START taking these medications   Details  !! apixaban (ELIQUIS) 5 MG TABS tablet Take 2 tablets (10 mg total) by mouth 2 (two) times daily. Qty: 28 tablet, Refills: 0    !! apixaban (ELIQUIS) 5 MG TABS tablet Take 1 tablet (5 mg total) by mouth 2 (two) times daily. Qty: 60 tablet, Refills: 6    !! insulin aspart (NOVOLOG) 100 UNIT/ML injection Inject 5 Units into the skin 3 (three) times daily with meals. Qty: 10 mL, Refills: 11    !! insulin aspart (NOVOLOG) 100 UNIT/ML injection Inject 0-9 Units into the skin 3 (three) times daily with meals. Qty: 10 mL, Refills: 11    levofloxacin (LEVAQUIN) 750 MG tablet Take 1 tablet (750 mg total) by mouth daily. Please continue for 3 more days to complete course Qty: 3 tablet, Refills: 0    midodrine (PROAMATINE) 5 MG tablet Take 1 tablet (5 mg total) by mouth 3 (three) times daily with meals. Qty: 90 tablet, Refills: 6     !! - Potential duplicate medications found. Please discuss with provider.    CONTINUE these medications which have NOT CHANGED   Details  allopurinol (ZYLOPRIM) 300 MG tablet Take 1 tablet (300 mg total) by mouth daily. For gout    aspirin EC 81 MG tablet Take 81 mg by mouth daily.    atorvastatin (LIPITOR) 10 MG tablet Take 1 tablet (10 mg total) by mouth daily. For high cholesterol    cholecalciferol (VITAMIN D) 400 UNITS TABS tablet Take 800 Units by mouth daily.    clonazePAM (KLONOPIN) 1 MG tablet Take 1 tablet (1 mg total) by mouth at bedtime. Qty: 30 tablet, Refills: 0    docusate sodium (COLACE) 100 MG capsule Take 2 capsules (200 mg total) by mouth 2 (two) times daily. Qty: 10 capsule, Refills: 0    feeding supplement,  GLUCERNA SHAKE, (GLUCERNA SHAKE) LIQD Take 237 mLs by mouth 3 (three) times daily with meals. Refills: 0    finasteride (PROSCAR) 5 MG tablet Take 1 tablet (5 mg total) by mouth daily. Qty: 1 tablet, Refills: 0    !! insulin aspart (NOVOLOG) 100 UNIT/ML injection Inject 0-9 Units into the skin 3 (three) times daily with meals. Qty: 10 mL, Refills: 11    insulin glargine (LANTUS) 100 UNIT/ML injection Inject 0.12 mLs (12 Units total) into the skin at bedtime. Qty: 10 mL, Refills: 11    mirtazapine (REMERON) 30 MG tablet Take 30 mg by mouth at bedtime.    OLANZapine zydis (ZYPREXA) 15 MG disintegrating tablet Take 2 tablets (30 mg total) by mouth at bedtime. Qty: 1 tablet, Refills: 0    ondansetron (ZOFRAN) 4 MG tablet Take 1 tablet (4 mg total) by mouth every 6 (six) hours as needed for nausea. Qty: 20 tablet, Refills: 0    polyethylene glycol (MIRALAX / GLYCOLAX) packet Take 17 g by mouth daily. Qty: 14 each, Refills: 0    tamsulosin (FLOMAX) 0.4 MG CAPS capsule Take 1 capsule (0.4 mg total) by mouth at bedtime. Qty: 30 capsule, Refills: 9    traZODone (DESYREL) 50 MG tablet Take 0.5 tablets (25 mg  total) by mouth 3 (three) times daily.     !! - Potential duplicate medications found. Please discuss with provider.    STOP taking these medications     amLODipine (NORVASC) 10 MG tablet      colchicine 0.6 MG tablet      diltiazem (CARDIZEM CD) 120 MG 24 hr capsule      furosemide (LASIX) 20 MG tablet      hydrALAZINE (APRESOLINE) 25 MG tablet      lisinopril (PRINIVIL,ZESTRIL) 10 MG tablet      megestrol (MEGACE) 400 MG/10ML suspension      metoprolol (TOPROL-XL) 200 MG 24 hr tablet          DISCHARGE INSTRUCTIONS:    Patient is to follow-up with his primary care physician, Dr. Wynelle Link within 1 week after discharge  If you experience worsening of your admission symptoms, develop shortness of breath, life threatening emergency, suicidal or homicidal thoughts you must  seek medical attention immediately by calling 911 or calling your MD immediately  if symptoms less severe.  You Must read complete instructions/literature along with all the possible adverse reactions/side effects for all the Medicines you take and that have been prescribed to you. Take any new Medicines after you have completely understood and accept all the possible adverse reactions/side effects.   Please note  You were cared for by a hospitalist during your hospital stay. If you have any questions about your discharge medications or the care you received while you were in the hospital after you are discharged, you can call the unit and asked to speak with the hospitalist on call if the hospitalist that took care of you is not available. Once you are discharged, your primary care physician will handle any further medical issues. Please note that NO REFILLS for any discharge medications will be authorized once you are discharged, as it is imperative that you return to your primary care physician (or establish a relationship with a primary care physician if you do not have one) for your aftercare needs so that they can reassess your need for medications and monitor your lab values.    Today   CHIEF COMPLAINT:  No chief complaint on file.   HISTORY OF PRESENT ILLNESS:  Christian [name]  is a 64 y.o. male with a known history of depression and resultant anorexia, weight loss, malnutrition who was admitted to behavioral health unit on 08/29/2015,  developed dizziness, lightheadedness and was found to have orthostatic hypotension and was admitted to the medical unit. Upon admission to medicine. Patient was noted to be in A. fib, RVR and hypoxic intermittently. CT scan of chest with IV contrast revealed a pulmonary embolism bilaterally, lower extremity Dopplers also showed the DVT bilaterally. Patient was seen by vascular surgeon, who recommended anticoagulation, but no IVC filter placement. For  Orthostatic hypotension. Patient received IV fluids. However, remained orthostatically hypotensive requiring some midodrine administration. For Atrial fibrillation. Patient was continued on metoprolol and Cardizem and his heart rate stabilized Discussion by problem 1. Near syncope, thought to be due to orthostatic hypotension, but most likely due to acute pulmonary embolism, patient's blood pressure remains labile. Initiate patient on midodrine and advance to 10 mg 3 times daily if needed. Unremarkable carotid ultrasound ,  echocardiogram . Patient was counseled about orthostatic hypotension dangerous and recommended to get up slowly.  2. Acute respiratory failure with hypoxia  due to acute pulmonary embolism, patient's oxygenation remains stable on room air at rest. However, may need to  be reevaluated on exertion  3. Orthostatic hypotension , initiate patient on midodrine at 5 mg 3 times daily dose and advance to 10 mg 3 times daily dose if needed  4. Elevated troponin. Echocardiogram was unremarkable, appreciate cardiology input, consider outpatient nuclear stress test according to cardiologist, but unlikely in the next few weeks due to acute pulmonary embolism episode, follow up with cardiologist as outpatient 5. Diabetes mellitus, hemoglobin A1c 7.5. Continue outpatient medications. Continue advanced insulin Lantus doses  and continue preprandial short-acting insulin, much improved oral intake  6. Somnolence, etiology is likely depression, unremarkable ABGs .  7. Bipolar disorder, depression, patient is to return back to behavioral medicine today to be taken care by Dr. Toni Amend  8. Left upper lobe nodule, unclear etiology, questionable infection, continue patient on levofloxacin orally for 3 more days 9. Bilateral DVT, appreciate vascular surgery  recommendations. No need for IVC filter placement per vascular surgeon, continue anticoagulation with Eliquis, loading dose, initially at 10 mg twice  daily dose for 7 days, then 5 mg twice daily dose     VITAL SIGNS:  Blood pressure 120/78, pulse 98, temperature 98.5 F (36.9 C), temperature source Oral, resp. rate 18, height 6' (1.829 m), weight 97.841 kg (215 lb 11.2 oz), SpO2 98 %.  I/O:   Intake/Output Summary (Last 24 hours) at 09/05/15 1656 Last data filed at 09/05/15 1130  Gross per 24 hour  Intake    120 ml  Output      0 ml  Net    120 ml    PHYSICAL EXAMINATION:  GENERAL:  64 y.o.-year-old patient lying in the bed with no acute distress.  EYES: Pupils equal, round, reactive to light and accommodation. No scleral icterus. Extraocular muscles intact.  HEENT: Head atraumatic, normocephalic. Oropharynx and nasopharynx clear.  NECK:  Supple, no jugular venous distention. No thyroid enlargement, no tenderness.  LUNGS: Normal breath sounds bilaterally, no wheezing, rales,rhonchi or crepitation. No use of accessory muscles of respiration.  CARDIOVASCULAR: S1, S2 normal. No murmurs, rubs, or gallops.  ABDOMEN: Soft, non-tender, non-distended. Bowel sounds present. No organomegaly or mass.  EXTREMITIES: No pedal edema, cyanosis, or clubbing.  NEUROLOGIC: Cranial nerves II through XII are intact. Muscle strength 5/5 in all extremities. Sensation intact. Gait not checked.  PSYCHIATRIC: The patient is alert and oriented x 3.  SKIN: No obvious rash, lesion, or ulcer.   DATA REVIEW:   CBC  Recent Labs Lab 09/03/15 0600  WBC 3.9  HGB 12.5*  HCT 37.3*  PLT 168    Chemistries   Recent Labs Lab 08/31/15 1132 09/03/15 0600  NA 138  --   K 4.4  --   CL 104  --   CO2 27  --   GLUCOSE 208*  --   BUN 21*  --   CREATININE 1.01 0.83  CALCIUM 9.3  --   AST 15  --   ALT 20  --   ALKPHOS 62  --   BILITOT 0.8  --     Cardiac Enzymes  Recent Labs Lab 09/01/15 0500  TROPONINI 0.03    Microbiology Results  Results for orders placed or performed during the hospital encounter of 07/16/15  Culture, blood (routine x  2)     Status: None   Collection Time: 07/25/15 11:44 PM  Result Value Ref Range Status   Specimen Description BLOOD LEFT ASSIST CONTROL  Final   Special Requests BOTTLES DRAWN AEROBIC AND ANAEROBIC 5CC  Final   Culture NO  GROWTH 5 DAYS  Final   Report Status 07/31/2015 FINAL  Final    RADIOLOGY:  US Venous Img Lower Bilateral  09/03/2015   CLINICAL DATA:  Pulmonary thromboembolism.  EXAM: BILATERAL LOWER EXTREMITY VENOUS DUPLEX ULTRASOUND  TECHNIQUE: Doppler venous assessment of the bilateral lower extremity deep venous system was performed, including characterization of spectral flow, compressibility, and phasicity.  COMPARISON:  None.  FINDINGS: In the right lower extremity, the common femoral and popliteal veins are compressible. There is noncompressible thrombus in the mid femoral vein. The proximal and distal femoral vein are compressible. No evidence of calf vein DVT in the right lower extremity. Doppler analysis demonstrates respiratory phasicity. Augmentation was deferred.  In the left lower extremity, the common femoral and popliteal veins are compressible. The femoral vein is noncompressible extending from its origin to the mid to distal thigh. The distal femoral vein is compressible. Doppler analysis demonstrates respiratory phasicity. No calf vein DVT.  IMPRESSION: The study is positive for bilateral lower extremity femoral vein DVT as described above.   Electronically Signed   By: Jolaine Click M.D.   On: 09/03/2015 17:18    EKG:   Orders placed or performed during the hospital encounter of 08/27/15  . EKG 12-Lead  . EKG 12-Lead      Management plans discussed with the patient, family and they are in agreement.  CODE STATUS:     Code Status Orders        Start     Ordered   08/31/15 1703  Full code   Continuous     08/31/15 1702      TOTAL TIME TAKING CARE OF THIS PATIENT: 45 minutes.    Katharina Caper M.D on 09/05/2015 at 4:56 PM  Between 7am to 6pm - Pager -  [phone]  After 6pm go to [email or web address] - password [email or web address] [facility]  [facility]  Office  [phone]  CC: Primary care physician; Leanor Rubenstein, MD

## 2015-09-05 NOTE — Progress Notes (Signed)
Patient is being transported down to behavioral health now. Report called to RN. Belongings packed and sent with patient. No further questions.

## 2015-09-05 NOTE — [facility] Suicide Risk Assessment (Signed)
[facility] Admission Suicide Risk Assessment   Nursing information obtained from:  Patient Demographic factors:  Male, Unemployed Current Mental Status:    Loss Factors:  Decline in physical health, Financial problems / change in socioeconomic status Historical Factors:  NA Risk Reduction Factors:  Positive social support, Sense of responsibility to family Total Time spent with patient: 45 minutes Principal Problem: <principal problem not specified> Diagnosis:   Patient Active Problem List   Diagnosis Date Noted  . Acute respiratory failure with hypoxia (HCC) [J96.01] 09/05/2015  . Elevated troponin [R79.89] 09/05/2015  . Somnolence [R40.0] 09/05/2015  . Bipolar depression (HCC) [F31.30] 09/05/2015  . Acute bilateral deep vein thrombosis (DVT) of femoral veins (HCC) [I82.413] 09/05/2015  . Bilateral pulmonary embolism (HCC) [I26.99] 09/02/2015  . Labile hypertension [I10]   . Pulmonary embolism with acute cor pulmonale (HCC) [I26.09]   . Dyspnea [R06.00]   . Orthostatic hypotension [I95.1]   . Bipolar disorder, in partial remission, most recent episode depressed (HCC) [F31.75]   . Syncope, near [R55] 08/31/2015  . Affective psychosis, bipolar (HCC) [F31.9]   . Bipolar disorder, now depressed (HCC) [F31.30] 08/28/2015  . Bipolar I disorder, severe, current or most recent episode depressed, with psychotic features (HCC) [F31.5]   . Pressure ulcer [L89.90] 07/30/2015  . Protein-calorie malnutrition, severe (HCC) [E43] 07/30/2015  . Dehydration [E86.0] 07/29/2015  . Bipolar disorder, current episode depressed, severe, with psychotic features (HCC) [F31.5] 07/09/2015  . Diabetes (HCC) [E11.9] 07/09/2015  . UTI (urinary tract infection) [N39.0] 06/19/2014  . Positive blood culture [R78.81] 06/19/2014  . Bipolar affective disorder, current episode manic with psychotic symptoms (HCC) [F31.2] 06/17/2014  . Type II or unspecified type diabetes mellitus with unspecified complication, uncontrolled  [E11.8, E11.65] 06/02/2014  . Other and unspecified hyperlipidemia [E78.5] 06/02/2014  . OSA on CPAP [G47.33] 06/02/2014  . 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 Gout [M10.9] 06/02/2014  . Hypertension [I10]      Continued Clinical Symptoms:  Alcohol Use Disorder Identification Test Final Score (AUDIT): 0 The "Alcohol Use Disorders Identification Test", Guidelines for Use in Primary Care, Second Edition.  World Science writer [facility]). Score between 0-7:  no or low risk or alcohol related problems. Score between 8-15:  moderate risk of alcohol related problems. Score between 16-19:  high risk of alcohol related problems. Score 20 or above:  warrants further diagnostic evaluation for alcohol dependence and treatment.   CLINICAL FACTORS:   Bipolar Disorder:   Depressive phase   Musculoskeletal: Strength & Muscle Tone: within normal limits Gait & Station: normal Patient leans: N/A  Psychiatric Specialty Exam: Physical Exam  Nursing note and vitals reviewed. Constitutional: He appears well-developed and well-nourished.  HENT:  Head: Normocephalic and atraumatic.  Eyes: Conjunctivae are normal. Pupils are equal, round, and reactive to light.  Neck: Normal range of motion.  Cardiovascular: Normal heart sounds.   Respiratory: Effort normal.  GI: Soft.  Musculoskeletal: Normal range of motion.  Neurological: He is alert.  Skin: Skin is warm and dry.    Review of Systems  Constitutional: Negative.   HENT: Negative.   Eyes: Negative.   Respiratory: Negative.   Cardiovascular: Negative.   Gastrointestinal: Negative.   Musculoskeletal: Negative.   Skin: Negative.   Neurological: Negative.   Psychiatric/Behavioral: Positive for memory loss. Negative for depression, suicidal ideas, hallucinations and substance abuse. The patient is not nervous/anxious and does not have insomnia.     Blood pressure 155/94, pulse 101,  temperature 98.1  F (36.7 C), temperature source Oral, resp. rate 18, height 6' (1.829 m), weight 97.523 kg (215 lb), SpO2 100 %.Body mass index is 29.15 kg/(m^2).  General Appearance: Fairly Groomed  Patent attorney::  Minimal  Speech:  Slow  Volume:  Decreased  Mood:  Dysphoric  Affect:  Congruent  Thought Process:  Goal Directed  Orientation:  Full (Time, Place, and Person)  Thought Content:  Negative  Suicidal Thoughts:  No  Homicidal Thoughts:  No  Memory:  Immediate;   Fair Recent;   Fair Remote;   Fair  Judgement:  Fair  Insight:  Fair  Psychomotor Activity:  Decreased  Concentration:  Fair  Recall:  [employer] of Knowledge:Fair  Language: Fair  Akathisia:  No  Handed:  Right  AIMS (if indicated):     Assets:  Desire for Improvement Financial Resources/Insurance Housing Intimacy Physical Health Resilience  Sleep:     Cognition: Impaired,  Mild  ADL's:  Intact     COGNITIVE FEATURES THAT CONTRIBUTE TO RISK:  Thought constriction (tunnel vision)    SUICIDE RISK:   Moderate:  Frequent suicidal ideation with limited intensity, and duration, some specificity in terms of plans, no associated intent, good self-control, limited dysphoria/symptomatology, some risk factors present, and identifiable protective factors, including available and accessible social support.  PLAN OF CARE: patient continues to have moderate at least chronic risk of suicide due to his multiple previous attempts and ongoing diagnosis of bipolar disorder. Patient will continue maintenance ECT at this point and medication while we work on a discharge plan. He is under 15 minute checks and daily observation.  Medical Decision Making:  Established Problem, Stable/Improving (1), Review of Psycho-Social Stressors (1), Review or order clinical lab tests (1) and Review of Medication Regimen & Side Effects (2)  I certify that inpatient services furnished can reasonably be expected to improve the patient's  condition.   Madalaine Portier 09/05/2015, 8:50 PM

## 2015-09-05 NOTE — Progress Notes (Signed)
ANTICOAGULATION CONSULT NOTE - Follow Up Consult  Pharmacy Consult for Eliquis (apixaban) Indication: pulmonary embolus  No Known Allergies  Patient Measurements: Height: 6' (182.9 cm) Weight: 215 lb 11.2 oz (97.841 kg) IBW/kg (Calculated) : 77.6  Vital Signs: Temp: 98.1 F (36.7 C) (10/07 0418) BP: 109/76 mmHg (10/07 0838) Pulse Rate: 97 (10/07 0838)  Labs:  Recent Labs  09/03/15 0600  HGB 12.5*  HCT 37.3*  PLT 168  CREATININE 0.83    Estimated Creatinine Clearance: 111.9 mL/min (by C-G formula based on Cr of 0.83).   Medical History: Past Medical History  Diagnosis Date  . Mood swings (HCC)   . Type II or unspecified type diabetes mellitus with unspecified complication, uncontrolled 06/02/2014  . Other and unspecified hyperlipidemia 06/02/2014  . OSA on CPAP 06/02/2014  . 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 Bipolar disorder (HCC)   . Hypertension   . Gout   . Bipolar 1 disorder (HCC)     Medications:  Scheduled:  . sodium chloride  250 mL Intravenous Once  . apixaban  10 mg Oral BID  . [START ON 09/09/2015] apixaban  5 mg Oral BID  . aspirin EC  81 mg Oral Daily  . atorvastatin  10 mg Oral Daily  . cholecalciferol  800 Units Oral Daily  . clonazePAM  1 mg Oral QHS  . feeding supplement (GLUCERNA SHAKE)  237 mL Oral TID WC  . finasteride  5 mg Oral Daily  . insulin aspart  0-9 Units Subcutaneous TID WC  . insulin aspart  5 Units Subcutaneous TID WC  . insulin glargine  17 Units Subcutaneous QHS  . levofloxacin  750 mg Oral Daily  . lisinopril  10 mg Oral Daily  . metoprolol  100 mg Oral BID  . mirtazapine  45 mg Oral QHS  . OLANZapine zydis  30 mg Oral QHS  . polyethylene glycol  17 g Oral Daily  . sodium chloride  10 mL Intravenous Q12H  . tamsulosin  0.4 mg Oral QHS    Assessment: Patient is a 64 yo male admitted for syncope and orthostatic hypotension.  D-dimer found to be elevated on 10/3 and CT showing  bilateral PE on 10/4.  MD consulted pharmacy for apixaban dosing.   SCr (10/5): 0.83, Wt >60 kg, age: 61 years  Goal of Therapy:  Monitor CBC and renal function per policy   Plan:  Will continue patient on Apixaban 10 mg PO BID x 7 days then start Apixaban 5 mg po BID thereafter (start date of 10/11).  Will order follow up SCr and CBC in AM per policy for monitoring.    Pharmacy will continue to follow.   Yemaya Barnier G 09/05/2015,9:31 AM

## 2015-09-05 NOTE — Progress Notes (Signed)
Spoke with Dr Sheryle Hail regarding pt getting scheduled hydralazine and BP being low without taking midnight dose.  MD stated to hold 0600 dose.  Will continue to monitor.   Cristela Felt, RN

## 2015-09-05 NOTE — Progress Notes (Signed)
Patient transferred from medical floor to [facility]. Patient pleasant and cooperative during admission assessment. Patient denies SI/HI at this time. Patient denies Auditory/ Visual Hallucinations. Patient informed of fall risk status, fall risk assessed "low" at this time. Patient oriented to unit/staff/room. Patient denies any questions/concerns at this time. Patient safe on unit with Q15 minute checks for safety. Patient was searched, skin was intact and no contraband found.

## 2015-09-05 NOTE — Tx Team (Signed)
Initial Interdisciplinary Treatment Plan   PATIENT STRESSORS: Health problems Medication change or noncompliance   PATIENT STRENGTHS: Average or above average intelligence General fund of knowledge Supportive family/friends   PROBLEM LIST: Problem List/Patient Goals Date to be addressed Date deferred Reason deferred Estimated date of resolution  Depression 09/05/2015     Suicide 09/05/2015                                                DISCHARGE CRITERIA:  Ability to meet basic life and health needs Adequate post-discharge living arrangements Improved stabilization in mood, thinking, and/or behavior Medical problems require only outpatient monitoring  PRELIMINARY DISCHARGE PLAN: Outpatient therapy Participate in family therapy Return to previous living arrangement  PATIENT/FAMIILY INVOLVEMENT: This treatment plan has been presented to and reviewed with the patient, Christian [name], and/or family member,  .  The patient and family have been given the opportunity to ask questions and make suggestions.  Christian [name] Christian [name] 09/05/2015, 6:31 PM

## 2015-09-05 NOTE — H&P (Signed)
Psychiatric Admission Assessment Adult  Patient Identification: Christian [name] MRN:  [id number] Date of Evaluation:  09/05/2015 Chief Complaint:  Bipolar Depression Principal Diagnosis: <principal problem not specified> Diagnosis:   Patient Active Problem List   Diagnosis Date Noted  . Acute respiratory failure with hypoxia (HCC) [J96.01] 09/05/2015  . Elevated troponin [R79.89] 09/05/2015  . Somnolence [R40.0] 09/05/2015  . Bipolar depression (HCC) [F31.30] 09/05/2015  . Acute bilateral deep vein thrombosis (DVT) of femoral veins (HCC) [I82.413] 09/05/2015  . Bilateral pulmonary embolism (HCC) [I26.99] 09/02/2015  . Labile hypertension [I10]   . Pulmonary embolism with acute cor pulmonale (HCC) [I26.09]   . Dyspnea [R06.00]   . Orthostatic hypotension [I95.1]   . Bipolar disorder, in partial remission, most recent episode depressed (HCC) [F31.75]   . Syncope, near [R55] 08/31/2015  . Affective psychosis, bipolar (HCC) [F31.9]   . Bipolar disorder, now depressed (HCC) [F31.30] 08/28/2015  . Bipolar I disorder, severe, current or most recent episode depressed, with psychotic features (HCC) [F31.5]   . Pressure ulcer [L89.90] 07/30/2015  . Protein-calorie malnutrition, severe (HCC) [E43] 07/30/2015  . Dehydration [E86.0] 07/29/2015  . Bipolar disorder, current episode depressed, severe, with psychotic features (HCC) [F31.5] 07/09/2015  . Diabetes (HCC) [E11.9] 07/09/2015  . UTI (urinary tract infection) [N39.0] 06/19/2014  . Positive blood culture [R78.81] 06/19/2014  . Bipolar affective disorder, current episode manic with psychotic symptoms (HCC) [F31.2] 06/17/2014  . Type II or unspecified type diabetes mellitus with unspecified complication, uncontrolled [E11.8, E11.65] 06/02/2014  . Other and unspecified hyperlipidemia [E78.5] 06/02/2014  . OSA on CPAP [G47.33] 06/02/2014  . 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 Gout [M10.9]  06/02/2014  . Hypertension [I10]    History of Present Illness:: 64 year old man with bipolar disorder depressed. Extended hospitalization for ECT treatment of severe psychotic depression. Had been transferred last weekend to the internal medicine service for evaluation of cardiac arrhythmia. Diagnosed eventually with pulmonary embolism. Now stable and transferred back to psychiatric service. Patient states he is not feeling good but has a hard time defining it. Denies any thoughts about suicide. Denies hallucinations. Associated Signs/Symptoms: Depression Symptoms:  depressed mood, anhedonia, psychomotor retardation, feelings of worthlessness/guilt, difficulty concentrating, suicidal thoughts without plan, loss of energy/fatigue, (Hypo) Manic Symptoms:  none currently Anxiety Symptoms:  none currently Psychotic Symptoms:  patient is currently denying having any but in the past has had very negative delusions of death PTSD Symptoms: Negative Total Time spent with patient: 45 minutes  Past Psychiatric History: patient has a long history of bipolar disorder with both manic and depressed phases. Multiple suicide attempts. Multiple episodes of psychotic symptoms. Has been tried on a wide range of medications including multiple anti-psychotics mood stabilizers and antidepressives. Has responded at least partially to ECT in the past.  Risk to Self: Is patient at risk for suicide?: No Risk to Others:   Prior Inpatient Therapy:   Prior Outpatient Therapy:    Alcohol Screening: 1. How often do you have a drink containing alcohol?: Never 2. How many drinks containing alcohol do you have on a typical day when you are drinking?: 1 or 2 3. How often do you have six or more drinks on one occasion?: Never Preliminary Score: 0 4. How often during the last year have you found that you were not able to stop drinking once you had started?: Never 5. How often during the last year have you failed to do  what was normally expected from  you becasue of drinking?: Never 6. How often during the last year have you needed a first drink in the morning to get yourself going after a heavy drinking session?: Never 7. How often during the last year have you had a feeling of guilt of remorse after drinking?: Never 8. How often during the last year have you been unable to remember what happened the night before because you had been drinking?: Never 9. Have you or someone else been injured as a result of your drinking?: No 10. Has a relative or friend or a doctor or another health worker been concerned about your drinking or suggested you cut down?: No Alcohol Use Disorder Identification Test Final Score (AUDIT): 0 Brief Intervention: AUDIT score less than 7 or less-screening does not suggest unhealthy drinking-brief intervention not indicated Substance Abuse History in the last 12 months:  No. Consequences of Substance Abuse: Negative Previous Psychotropic Medications: Yes  Psychological Evaluations: No  Past Medical History:  Past Medical History  Diagnosis Date  . Mood swings (HCC)   . Type II or unspecified type diabetes mellitus with unspecified complication, uncontrolled 06/02/2014  . Other and unspecified hyperlipidemia 06/02/2014  . OSA on CPAP 06/02/2014  . 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 Bipolar disorder (HCC)   . Hypertension   . Gout   . Bipolar 1 disorder (HCC)    History reviewed. No pertinent past surgical history. Family History:  Family History  Problem Relation Age of Onset  . Dementia Mother   . Arthritis Father   . Heart failure Neg Hx   . Kidney failure Neg Hx   . Cancer Neg Hx    Family Psychiatric  History: there is a positive family history of mood instability and depression Social History:  History  Alcohol Use No     History  Drug Use No    Social History   Social History  . Marital Status: Married    Spouse Name:  N/A  . Number of Children: N/A  . Years of Education: N/A   Social History Main Topics  . Smoking status: Never Smoker   . Smokeless tobacco: None  . Alcohol Use: No  . Drug Use: No  . Sexual Activity: Not Currently   Other Topics Concern  . None   Social History Narrative   *The patient has been married for over 30 years and is currently on disability. He lives with his wife in the [place] area and has one daughter.             Additional Social History:                         Allergies:  No Known Allergies Lab Results:  Results for orders placed or performed during the hospital encounter of 08/31/15 (from the past 48 hour(s))  Glucose, capillary     Status: Abnormal   Collection Time: 09/04/15  7:37 AM  Result Value Ref Range   Glucose-Capillary 129 (H) 65 - 99 mg/dL  Glucose, capillary     Status: Abnormal   Collection Time: 09/04/15 11:47 AM  Result Value Ref Range   Glucose-Capillary 185 (H) 65 - 99 mg/dL  Glucose, capillary     Status: Abnormal   Collection Time: 09/04/15  4:36 PM  Result Value Ref Range   Glucose-Capillary 113 (H) 65 - 99 mg/dL  Glucose, capillary     Status: Abnormal   Collection  Time: 09/04/15  7:55 PM  Result Value Ref Range   Glucose-Capillary 118 (H) 65 - 99 mg/dL   Comment 1 Notify RN   Glucose, capillary     Status: Abnormal   Collection Time: 09/05/15  7:47 AM  Result Value Ref Range   Glucose-Capillary 132 (H) 65 - 99 mg/dL  Glucose, capillary     Status: Abnormal   Collection Time: 09/05/15 11:47 AM  Result Value Ref Range   Glucose-Capillary 145 (H) 65 - 99 mg/dL   Comment 1 Notify RN   Glucose, capillary     Status: Abnormal   Collection Time: 09/05/15  4:25 PM  Result Value Ref Range   Glucose-Capillary 141 (H) 65 - 99 mg/dL   Comment 1 Notify RN     Metabolic Disorder Labs:  Lab Results  Component Value Date   HGBA1C 7.5* 09/01/2015   MPG 186* 07/02/2014   MPG 240* 05/25/2014   No results found for:  PROLACTIN Lab Results  Component Value Date   CHOL  03/08/2011    99        ATP III CLASSIFICATION:  <200     mg/dL   Desirable  409-811  mg/dL   Borderline High  >=914    mg/dL   High          TRIG 62 03/08/2011   HDL 34* 03/08/2011   CHOLHDL 2.9 03/08/2011   VLDL 12 03/08/2011   LDLCALC  03/08/2011    53        Total Cholesterol/HDL:CHD Risk Coronary Heart Disease Risk Table                     Men   Women  1/2 Average Risk   3.4   3.3  Average Risk       5.0   4.4  2 X Average Risk   9.6   7.1  3 X Average Risk  23.4   11.0        Use the calculated Patient Ratio above and the CHD Risk Table to determine the patient's CHD Risk.        ATP III CLASSIFICATION (LDL):  <100     mg/dL   Optimal  782-956  mg/dL   Near or Above                    Optimal  130-159  mg/dL   Borderline  213-086  mg/dL   High  >578     mg/dL   Very High   LDLCALC  03/07/2011    32        Total Cholesterol/HDL:CHD Risk Coronary Heart Disease Risk Table                     Men   Women  1/2 Average Risk   3.4   3.3  Average Risk       5.0   4.4  2 X Average Risk   9.6   7.1  3 X Average Risk  23.4   11.0        Use the calculated Patient Ratio above and the CHD Risk Table to determine the patient's CHD Risk.        ATP III CLASSIFICATION (LDL):  <100     mg/dL   Optimal  469-629  mg/dL   Near or Above  Optimal  130-159  mg/dL   Borderline  257-505  mg/dL   High  >183     mg/dL   Very High    Current Medications: Current Facility-Administered Medications  Medication Dose Route Frequency Provider Last Rate Last Dose  . acetaminophen (TYLENOL) tablet 650 mg  650 mg Oral Q6H PRN Audery Amel, MD      . allopurinol (ZYLOPRIM) tablet 300 mg  300 mg Oral Daily Audery Amel, MD      . alum & mag hydroxide-simeth (MAALOX/MYLANTA) 200-200-20 MG/5ML suspension 30 mL  30 mL Oral Q4H PRN Audery Amel, MD      . apixaban (ELIQUIS) tablet 10 mg  10 mg Oral BID Audery Amel, MD      . aspirin EC tablet 81 mg  81 mg Oral Daily Audery Amel, MD      . Melene Muller ON 09/06/2015] atorvastatin (LIPITOR) tablet 10 mg  10 mg Oral q1800 Audery Amel, MD      . clonazePAM (KLONOPIN) tablet 1 mg  1 mg Oral QHS Audery Amel, MD      . finasteride (PROSCAR) tablet 5 mg  5 mg Oral Daily Audery Amel, MD      . Melene Muller ON 09/06/2015] insulin aspart (novoLOG) injection 0-15 Units  0-15 Units Subcutaneous TID WC Darliss Ridgel, MD      . insulin aspart (novoLOG) injection 0-5 Units  0-5 Units Subcutaneous QHS Darliss Ridgel, MD      . Melene Muller ON 09/06/2015] insulin aspart (novoLOG) injection 4 Units  4 Units Subcutaneous TID WC Darliss Ridgel, MD      . insulin glargine (LANTUS) injection 15 Units  15 Units Subcutaneous QHS Darliss Ridgel, MD      . levofloxacin (LEVAQUIN) tablet 750 mg  750 mg Oral Daily Audery Amel, MD      . magnesium hydroxide (MILK OF MAGNESIA) suspension 30 mL  30 mL Oral Daily PRN Audery Amel, MD      . Melene Muller ON 09/06/2015] midodrine (PROAMATINE) tablet 5 mg  5 mg Oral TID WC Audery Amel, MD      . mirtazapine (REMERON) tablet 45 mg  45 mg Oral QHS Ledell Codrington T Grettell Ransdell, MD      . OLANZapine (ZYPREXA) tablet 30 mg  30 mg Oral QHS Kaeley Vinje T Jakwon Gayton, MD      . polyethylene glycol (MIRALAX / GLYCOLAX) packet 17 g  17 g Oral Daily Audery Amel, MD      . Melene Muller ON 09/06/2015] tamsulosin (FLOMAX) capsule 0.4 mg  0.4 mg Oral QPC supper Audery Amel, MD      . traZODone (DESYREL) tablet 25 mg  25 mg Oral QHS Audery Amel, MD       PTA Medications: Prescriptions prior to admission  Medication Sig Dispense Refill Last Dose  . allopurinol (ZYLOPRIM) 300 MG tablet Take 1 tablet (300 mg total) by mouth daily. For gout   Past Month at Unknown time  . apixaban (ELIQUIS) 5 MG TABS tablet Take 2 tablets (10 mg total) by mouth 2 (two) times daily. 28 tablet 0   . [START ON 09/09/2015] apixaban (ELIQUIS) 5 MG TABS tablet Take 1 tablet (5 mg total) by mouth 2 (two)  times daily. 60 tablet 6   . aspirin EC 81 MG tablet Take 81 mg by mouth daily.   Past Month at Unknown time  . atorvastatin (LIPITOR) 10 MG  tablet Take 1 tablet (10 mg total) by mouth daily. For high cholesterol   Past Month at Unknown time  . cholecalciferol (VITAMIN D) 400 UNITS TABS tablet Take 800 Units by mouth daily.   Past Month at Unknown time  . clonazePAM (KLONOPIN) 1 MG tablet Take 1 tablet (1 mg total) by mouth at bedtime. 30 tablet 0   . docusate sodium (COLACE) 100 MG capsule Take 2 capsules (200 mg total) by mouth 2 (two) times daily. 10 capsule 0   . feeding supplement, GLUCERNA SHAKE, (GLUCERNA SHAKE) LIQD Take 237 mLs by mouth 3 (three) times daily with meals.  0   . finasteride (PROSCAR) 5 MG tablet Take 1 tablet (5 mg total) by mouth daily. 1 tablet 0   . insulin aspart (NOVOLOG) 100 UNIT/ML injection Inject 0-9 Units into the skin 3 (three) times daily with meals. 10 mL 11   . insulin aspart (NOVOLOG) 100 UNIT/ML injection Inject 5 Units into the skin 3 (three) times daily with meals. 10 mL 11   . insulin aspart (NOVOLOG) 100 UNIT/ML injection Inject 0-9 Units into the skin 3 (three) times daily with meals. 10 mL 11   . insulin glargine (LANTUS) 100 UNIT/ML injection Inject 0.12 mLs (12 Units total) into the skin at bedtime. 10 mL 11   . levofloxacin (LEVAQUIN) 750 MG tablet Take 1 tablet (750 mg total) by mouth daily. Please continue for 3 more days to complete course 3 tablet 0   . midodrine (PROAMATINE) 5 MG tablet Take 1 tablet (5 mg total) by mouth 3 (three) times daily with meals. 90 tablet 6   . mirtazapine (REMERON) 30 MG tablet Take 30 mg by mouth at bedtime.   07/24/2015 at Unknown time  . OLANZapine zydis (ZYPREXA) 15 MG disintegrating tablet Take 2 tablets (30 mg total) by mouth at bedtime. 1 tablet 0   . ondansetron (ZOFRAN) 4 MG tablet Take 1 tablet (4 mg total) by mouth every 6 (six) hours as needed for nausea. 20 tablet 0   . polyethylene glycol (MIRALAX /  GLYCOLAX) packet Take 17 g by mouth daily. 14 each 0   . tamsulosin (FLOMAX) 0.4 MG CAPS capsule Take 1 capsule (0.4 mg total) by mouth at bedtime. 30 capsule 9   . traZODone (DESYREL) 50 MG tablet Take 0.5 tablets (25 mg total) by mouth 3 (three) times daily. (Patient taking differently: Take 200 mg by mouth at bedtime. )       Musculoskeletal: Strength & Muscle Tone: within normal limits Gait & Station: broad based Patient leans: N/A  Psychiatric Specialty Exam: Physical Exam  Nursing note and vitals reviewed. Constitutional: He appears well-developed and well-nourished.  HENT:  Head: Normocephalic and atraumatic.  Eyes: Conjunctivae are normal. Pupils are equal, round, and reactive to light.  Neck: Normal range of motion.  Cardiovascular: Normal rate, regular rhythm and normal heart sounds.   Respiratory: Effort normal and breath sounds normal. No respiratory distress. He has no wheezes.  GI: Soft.  Musculoskeletal: Normal range of motion.  Neurological: He is alert.  Skin: Skin is warm and dry.  Psychiatric: Judgment and thought content normal. His affect is blunt. His speech is delayed. He is slowed. He exhibits abnormal recent memory.    Review of Systems  Constitutional: Negative.   HENT: Negative.   Eyes: Negative.   Respiratory: Negative.   Cardiovascular: Negative.   Gastrointestinal: Negative.   Musculoskeletal: Negative.   Skin: Negative.   Neurological: Negative.   Psychiatric/Behavioral:  Positive for memory loss. Negative for depression, suicidal ideas, hallucinations and substance abuse. The patient is not nervous/anxious and does not have insomnia.     Blood pressure 155/94, pulse 101, temperature 98.1 F (36.7 C), temperature source Oral, resp. rate 18, height 6' (1.829 m), weight 97.523 kg (215 lb), SpO2 100 %.Body mass index is 29.15 kg/(m^2).  General Appearance: Guarded  Eye Contact::  Minimal  Speech:  Slow  Volume:  Decreased  Mood:  Depressed   Affect:  Flat  Thought Process:  slow  Orientation:  Full (Time, Place, and Person)  Thought Content:  Negative  Suicidal Thoughts:  denies any but we know that he has covered this up in the past  Homicidal Thoughts:  No  Memory:  Immediate;   Fair Recent;   Fair Remote;   Fair  Judgement:  Fair  Insight:  Fair  Psychomotor Activity:  Decreased  Concentration:  Fair  Recall:  [employer] of Knowledge:Fair  Language: Fair  Akathisia:  No  Handed:  Right  AIMS (if indicated):     Assets:  Communication Skills Desire for Improvement Financial Resources/Insurance Housing Intimacy Resilience Social Support  ADL's:  Intact  Cognition: Impaired,  Mild  Sleep:        Treatment Plan Summary: Daily contact with patient to assess and evaluate symptoms and progress in treatment, Medication management and Plan patient continues to have multiple symptoms of depression although they have all improved with ECT. Patient can be hard to assess sometimes because he is often very withdrawn and uncertain about his symptoms. Plan is to continue current mirtazapine and Zyprexa and also schedule maintenance ECT for Monday. Case discussed with the patient. We will continue to work with the wife as well. For his placement we are trying to find some kind of plan that might allow for a transition rather than going directly home if that is possible. For his diabetes we continue to monitor blood sugars and use insulin. He has been started on Eliquis for his pulmonary embolism. He has been continued on medicine for his gout and prostate enlargement  Observation Level/Precautions:  15 minute checks  Laboratory:  only finger sticks at this point  Psychotherapy:    Medications:    Consultations:    Discharge Concerns:    Estimated LOS:  Other:     I certify that inpatient services furnished can reasonably be expected to improve the patient's condition.   Nishaan Stanke 10/7/20168:43 PM

## 2015-09-05 NOTE — Progress Notes (Signed)
Patient discharging to behavioral health today. Dr. Winona Legato wanted me to check with behavioral health to see if they could accept the PICC line in case it is needed again. Behavioral health will not accept patient with PICC line. MD stated to remove before discharge. Wife has been called and updated on discharge plans.

## 2015-09-05 NOTE — Progress Notes (Signed)
Discharge instructions given to patient. Education given on new medications as well as clots. Pharmacist has been by to review eliquis with patient. PICC line removed per order. Tele discontinued. Awaiting behavior health confirmation for room number. No further questions at this time.

## 2015-09-06 LAB — GLUCOSE, CAPILLARY
Glucose-Capillary: 123 mg/dL — ABNORMAL HIGH (ref 65–99)
Glucose-Capillary: 160 mg/dL — ABNORMAL HIGH (ref 65–99)
Glucose-Capillary: 171 mg/dL — ABNORMAL HIGH (ref 65–99)

## 2015-09-06 MED ORDER — LISINOPRIL 20 MG PO TABS
20.0000 mg | ORAL_TABLET | Freq: Every day | ORAL | Status: DC
  Administered 2015-09-06: 20 mg via ORAL
  Filled 2015-09-06: qty 1

## 2015-09-06 NOTE — Progress Notes (Signed)
Pt is AOx3, denies any SI/HI/VAH, pt mood is improved with euthymic affect. Pt denies any pain, HS finger stick is 146 no sliding scale needed. Pt remains compliant with his medications, no concerns voiced, safety maintained.

## 2015-09-06 NOTE — Plan of Care (Signed)
Problem: Alteration in mood Goal: STG-Patient is able to discuss feelings and issues (Patient is able to discuss feelings and issues leading to depression)  Outcome: Progressing Pt is working towards achieving this goal.

## 2015-09-06 NOTE — Progress Notes (Signed)
[facility] MD Progress Note  09/06/2015 3:41 PM Christian [name]  MRN:  [id number] Subjective:  Patient is complaining of feeling tired and run down. Also complains of pain in his left shoulder. He is not getting up and attending groups very much. He appears to be getting a little bit more withdrawn. He denies that he's having any suicidal thoughts right now but is not presenting a very clear history. He has no new medical complaints. No complaints of shortness of breath. Complaints of any medicine side effects Principal Problem: Bipolar disorder, now depressed [facility]) Diagnosis:   Patient Active Problem List   Diagnosis Date Noted  . Acute respiratory failure with hypoxia (HCC) [J96.01] 09/05/2015  . Elevated troponin [R79.89] 09/05/2015  . Somnolence [R40.0] 09/05/2015  . Bipolar depression (HCC) [F31.30] 09/05/2015  . Acute bilateral deep vein thrombosis (DVT) of femoral veins (HCC) [I82.413] 09/05/2015  . Bilateral pulmonary embolism (HCC) [I26.99] 09/02/2015  . Labile hypertension [I10]   . Pulmonary embolism with acute cor pulmonale (HCC) [I26.09]   . Dyspnea [R06.00]   . Orthostatic hypotension [I95.1]   . Bipolar disorder, in partial remission, most recent episode depressed (HCC) [F31.75]   . Syncope, near [R55] 08/31/2015  . Affective psychosis, bipolar (HCC) [F31.9]   . Bipolar disorder, now depressed (HCC) [F31.30] 08/28/2015  . Bipolar I disorder, severe, current or most recent episode depressed, with psychotic features (HCC) [F31.5]   . Pressure ulcer [L89.90] 07/30/2015  . Protein-calorie malnutrition, severe (HCC) [E43] 07/30/2015  . Dehydration [E86.0] 07/29/2015  . Bipolar disorder, current episode depressed, severe, with psychotic features (HCC) [F31.5] 07/09/2015  . Diabetes (HCC) [E11.9] 07/09/2015  . UTI (urinary tract infection) [N39.0] 06/19/2014  . Positive blood culture [R78.81] 06/19/2014  . Bipolar affective disorder, current episode manic with psychotic symptoms (HCC)  [F31.2] 06/17/2014  . Type II or unspecified type diabetes mellitus with unspecified complication, uncontrolled [E11.8, E11.65] 06/02/2014  . Other and unspecified hyperlipidemia [E78.5] 06/02/2014  . OSA on CPAP [G47.33] 06/02/2014  . 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 Gout [M10.9] 06/02/2014  . Hypertension [I10]    Total Time spent with patient: 30 minutes  Past Psychiatric History: Long-standing history of bipolar disorder currently a long severe depression with psychotic features responding to ECT. Patient has had difficulty responding which has been getting worse with time as he has become more chronically impaired. Multiple suicide attempts in the past. Multiple hospitalizations.  Past Medical History:  Past Medical History  Diagnosis Date  . Mood swings (HCC)   . Type II or unspecified type diabetes mellitus with unspecified complication, uncontrolled 06/02/2014  . Other and unspecified hyperlipidemia 06/02/2014  . OSA on CPAP 06/02/2014  . 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 Bipolar disorder (HCC)   . Hypertension   . Gout   . Bipolar 1 disorder (HCC)    History reviewed. No pertinent past surgical history. Family History:  Family History  Problem Relation Age of Onset  . Dementia Mother   . Arthritis Father   . Heart failure Neg Hx   . Kidney failure Neg Hx   . Cancer Neg Hx    Family Psychiatric  History: Positive family history of depression and possibly bipolar disorder Social History:  History  Alcohol Use No     History  Drug Use No    Social History   Social History  . Marital Status: Married    Spouse Name: N/A  .  Number of Children: N/A  . Years of Education: N/A   Social History Main Topics  . Smoking status: Never Smoker   . Smokeless tobacco: None  . Alcohol Use: No  . Drug Use: No  . Sexual Activity: Not Currently   Other Topics Concern  . None    Social History Narrative   *The patient has been married for over 30 years and is currently on disability. He lives with his wife in the [place] area and has one daughter.             Additional Social History:                         Sleep: Good  Appetite:  Good  Current Medications: Current Facility-Administered Medications  Medication Dose Route Frequency Provider Last Rate Last Dose  . acetaminophen (TYLENOL) tablet 650 mg  650 mg Oral Q6H PRN Audery Amel, MD      . allopurinol (ZYLOPRIM) tablet 300 mg  300 mg Oral Daily Audery Amel, MD   300 mg at 09/06/15 0901  . alum & mag hydroxide-simeth (MAALOX/MYLANTA) 200-200-20 MG/5ML suspension 30 mL  30 mL Oral Q4H PRN Audery Amel, MD      . apixaban (ELIQUIS) tablet 10 mg  10 mg Oral BID Audery Amel, MD   10 mg at 09/06/15 0913  . [START ON 09/09/2015] apixaban (ELIQUIS) tablet 5 mg  5 mg Oral BID Audery Amel, MD      . aspirin EC tablet 81 mg  81 mg Oral Daily Audery Amel, MD   81 mg at 09/06/15 0901  . atorvastatin (LIPITOR) tablet 10 mg  10 mg Oral q1800 Audery Amel, MD      . clonazePAM Scarlette Calico) tablet 1 mg  1 mg Oral QHS Audery Amel, MD   1 mg at 09/05/15 2133  . finasteride (PROSCAR) tablet 5 mg  5 mg Oral Daily Audery Amel, MD   5 mg at 09/06/15 0902  . insulin aspart (novoLOG) injection 0-15 Units  0-15 Units Subcutaneous TID WC Darliss Ridgel, MD   2 Units at 09/06/15 1147  . insulin aspart (novoLOG) injection 0-5 Units  0-5 Units Subcutaneous QHS Darliss Ridgel, MD   0 Units at 09/05/15 2259  . insulin aspart (novoLOG) injection 4 Units  4 Units Subcutaneous TID WC Darliss Ridgel, MD   4 Units at 09/06/15 1148  . insulin glargine (LANTUS) injection 15 Units  15 Units Subcutaneous QHS Darliss Ridgel, MD   15 Units at 09/05/15 2200  . levofloxacin (LEVAQUIN) tablet 750 mg  750 mg Oral Daily Audery Amel, MD   750 mg at 09/06/15 0901  . magnesium hydroxide (MILK OF MAGNESIA) suspension 30  mL  30 mL Oral Daily PRN Audery Amel, MD      . midodrine (PROAMATINE) tablet 5 mg  5 mg Oral TID WC Audery Amel, MD   5 mg at 09/06/15 1149  . mirtazapine (REMERON) tablet 45 mg  45 mg Oral QHS Audery Amel, MD   45 mg at 09/05/15 2133  . OLANZapine (ZYPREXA) tablet 30 mg  30 mg Oral QHS Audery Amel, MD   30 mg at 09/05/15 2132  . polyethylene glycol (MIRALAX / GLYCOLAX) packet 17 g  17 g Oral Daily Audery Amel, MD   17 g at 09/06/15 0901  . tamsulosin (FLOMAX) capsule  0.4 mg  0.4 mg Oral QPC supper Audery Amel, MD      . traZODone (DESYREL) tablet 25 mg  25 mg Oral QHS Audery Amel, MD   25 mg at 09/05/15 2133    Lab Results:  Results for orders placed or performed during the hospital encounter of 09/05/15 (from the past 48 hour(s))  Glucose, capillary     Status: Abnormal   Collection Time: 09/05/15  9:31 PM  Result Value Ref Range   Glucose-Capillary 146 (H) 65 - 99 mg/dL  Glucose, capillary     Status: Abnormal   Collection Time: 09/06/15 11:16 AM  Result Value Ref Range   Glucose-Capillary 123 (H) 65 - 99 mg/dL   Comment 1 Notify RN     Physical Findings: AIMS: Facial and Oral Movements Muscles of Facial Expression: None, normal Lips and Perioral Area: None, normal Jaw: None, normal Tongue: None, normal,Extremity Movements Upper (arms, wrists, hands, fingers): None, normal Lower (legs, knees, ankles, toes): None, normal, Trunk Movements Neck, shoulders, hips: None, normal, Overall Severity Severity of abnormal movements (highest score from questions above): None, normal Incapacitation due to abnormal movements: None, normal Patient's awareness of abnormal movements (rate only patient's report): No Awareness, Dental Status Current problems with teeth and/or dentures?: No Does patient usually wear dentures?: No  CIWA:    COWS:     Musculoskeletal: Strength & Muscle Tone: decreased Gait & Station: normal Patient leans: N/A  Psychiatric Specialty  Exam: Review of Systems  Constitutional: Positive for malaise/fatigue.  HENT: Negative.   Eyes: Negative.   Respiratory: Negative.   Cardiovascular: Negative.   Gastrointestinal: Negative.   Musculoskeletal: Positive for joint pain.  Skin: Negative.   Neurological: Positive for weakness.  Psychiatric/Behavioral: Positive for memory loss. Negative for depression, suicidal ideas, hallucinations and substance abuse. The patient is not nervous/anxious and does not have insomnia.     Blood pressure 150/97, pulse 102, temperature 98 F (36.7 C), temperature source Oral, resp. rate 20, height 6' (1.829 m), weight 97.523 kg (215 lb), SpO2 100 %.Body mass index is 29.15 kg/(m^2).  General Appearance: Casual  Eye Contact::  Fair  Speech:  Slow  Volume:  Decreased  Mood:  Dysphoric  Affect:  Depressed  Thought Process:  Tangential  Orientation:  Full (Time, Place, and Person)  Thought Content:  Negative  Suicidal Thoughts:  No  Homicidal Thoughts:  No  Memory:  Immediate;   Good Recent;   Fair Remote;   Fair  Judgement:  Impaired  Insight:  Shallow  Psychomotor Activity:  Decreased  Concentration:  Poor  Recall:  [employer] of Knowledge:Good  Language: Fair  Akathisia:  No  Handed:  Right  AIMS (if indicated):     Assets:  Desire for Improvement Financial Resources/Insurance Housing Intimacy Leisure Time Resilience Social Support  ADL's:  Intact  Cognition: Impaired,  Mild  Sleep:  Number of Hours: 8   Treatment Plan Summary: Daily contact with patient to assess and evaluate symptoms and progress in treatment, Medication management and Plan Patient is being managed for his illness. His depression has improved. He has tolerated ECT well. We took a long sustain pause from ECT because of his pulmonary embolism. At this point I think he should be getting back into at least a maintenance schedule.Continue medication management with current mirtazapine and Zyprexa diabetes under  fairly adequate control.Blood pressure under fairly adequate control no change to medicine.New pulmonary embolism. Case discussed with the pharmacy. Adjusted plans for  dosage of Eliquis.Discharge planning still pending. Reviewed case with social work and patient.   Claiborne Stroble 09/06/2015, 3:41 PM

## 2015-09-06 NOTE — Plan of Care (Signed)
Problem: Consults Goal: Depression Patient Education See Patient Education Module for education specifics.  Outcome: Progressing Patient with depressed affect and cooperative behavior with meals, meds and plan of care. No SI/HI at this time. Good adls, good appetite. No SI/No HI at this time. Blood glucose monitored with coverage as ordered. Minimal interaction with peers. Appropriate when staff initiates interaction. Rests in bed between meals. Withdrawn and quiet. Ambulates with slow and steady gait. Safety maintained.

## 2015-09-07 LAB — URINALYSIS COMPLETE WITH MICROSCOPIC (ARMC ONLY)
Bacteria, UA: NONE SEEN
Bilirubin Urine: NEGATIVE
Glucose, UA: NEGATIVE mg/dL
Hgb urine dipstick: NEGATIVE
Ketones, ur: NEGATIVE mg/dL
Leukocytes, UA: NEGATIVE
Nitrite: NEGATIVE
Protein, ur: 30 mg/dL — AB
RBC / HPF: NONE SEEN RBC/hpf (ref 0–5)
Specific Gravity, Urine: 1.008 (ref 1.005–1.030)
Squamous Epithelial / LPF: NONE SEEN
WBC, UA: NONE SEEN WBC/hpf (ref 0–5)
pH: 5 (ref 5.0–8.0)

## 2015-09-07 LAB — CBC
HCT: 41.9 % (ref 40.0–52.0)
Hemoglobin: 14.2 g/dL (ref 13.0–18.0)
MCH: 31 pg (ref 26.0–34.0)
MCHC: 33.9 g/dL (ref 32.0–36.0)
MCV: 91.3 fL (ref 80.0–100.0)
Platelets: 204 10*3/uL (ref 150–440)
RBC: 4.59 MIL/uL (ref 4.40–5.90)
RDW: 13.6 % (ref 11.5–14.5)
WBC: 4.6 10*3/uL (ref 3.8–10.6)

## 2015-09-07 LAB — CREATININE, SERUM
Creatinine, Ser: 1.11 mg/dL (ref 0.61–1.24)
GFR calc Af Amer: >60 mL/min (ref >60)
GFR calc non Af Amer: >60 mL/min (ref >60)

## 2015-09-07 LAB — GLUCOSE, CAPILLARY
Glucose-Capillary: 117 mg/dL — ABNORMAL HIGH (ref 65–99)
Glucose-Capillary: 168 mg/dL — ABNORMAL HIGH (ref 65–99)
Glucose-Capillary: 177 mg/dL — ABNORMAL HIGH (ref 65–99)
Glucose-Capillary: 228 mg/dL — ABNORMAL HIGH (ref 65–99)

## 2015-09-07 MED ORDER — LISINOPRIL 10 MG PO TABS
10.0000 mg | ORAL_TABLET | Freq: Every day | ORAL | Status: DC
  Administered 2015-09-08 – 2015-09-10 (×3): 10 mg via ORAL
  Filled 2015-09-07 (×4): qty 1

## 2015-09-07 NOTE — Plan of Care (Signed)
Problem: Consults Goal: Depression Patient Education See Patient Education Module for education specifics.  Outcome: Progressing No SI/HI at this time.      

## 2015-09-07 NOTE — Progress Notes (Signed)
Patient denies SI. Patient states that today is better than yesterday. Slight tremble in hands. Patient up moving and visible on the unit. Med compliant. Q 15 min checks in place. Will continue to monitor. Patient slept 7.25 hours.

## 2015-09-07 NOTE — Plan of Care (Signed)
Problem: Alteration in mood Goal: LTG-Pt's behavior demonstrates decreased signs of depression (Patient's behavior demonstrates decreased signs of depression to the point the patient is safe to return home and continue treatment in an outpatient setting)  Outcome: Progressing Patient states that today was a better than yesterday.

## 2015-09-07 NOTE — Progress Notes (Signed)
Patient BP 90/54,124. Patient states he got dizzy when he stood up. Skin warm and dry. No distress. Denies pain. BLE with no edema and pedal and popliteal pulses positive. Patient blood glucose monitored and recorded with insulin as ordered. PO fluids tolerated well. Patient ate am meal. Safety maintained at this time.

## 2015-09-07 NOTE — Progress Notes (Signed)
Urine sample collected with results pending.

## 2015-09-07 NOTE — Progress Notes (Addendum)
Patient with depressed, blunted affect and cooperative behavior with meals, meds and current plan of care. Patient blood pressure low and pulse elevated. Patient states he feels dizzy when he gets up. Dr. Toni Amend notified and am Blood pressure held and po fluids encouraged and tolerated well. Patient has milk and juice with am meal then takes an additional 360 ml po fluids this am. Writer to assist patient to toileting and ambulation. Patient rests in bed between meals. Blood Glucose monitored and recorded, with insulin as ordered. No s/s of hypo/hyperglycemia. Safety maintained. No distress.

## 2015-09-07 NOTE — [facility] Group Notes (Signed)
[facility] Group Notes:  (Nursing/MHT/Case Management/Adjunct)  Date:  09/07/2015  Time:  2:28 AM  Type of Therapy:  Group Therapy  Participation Level:  Active  Participation Quality:  Appropriate  Affect:  Appropriate  Cognitive:  Appropriate  Insight:  Appropriate  Engagement in Group:  Engaged  Modes of Intervention:  n/a  Summary of Progress/Problems:  Veva Holes 09/07/2015, 2:28 AM

## 2015-09-07 NOTE — Progress Notes (Signed)
[facility] MD Progress Note  09/07/2015 4:52 PM Christian [name]  MRN:  [id number] Subjective: Patient continues to complain of feeling "bad". He says that he just feels negative. He denies having any suicidal thoughts. He is not very talkative. Pretty much the same as he was yesterday. Denies hallucinations. Still having pain in his shoulders. Sleep is only adequate. Appetite is okay. Patient is not very active in conversing about his treatment plan.  Principal Problem: Bipolar disorder, now depressed [facility]) Diagnosis:   Patient Active Problem List   Diagnosis Date Noted  . Acute respiratory failure with hypoxia ([place]) [J96.01] 09/05/2015  . Elevated troponin [R79.89] 09/05/2015  . Somnolence [R40.0] 09/05/2015  . Bipolar depression ([place]) [F31.30] 09/05/2015  . Acute bilateral deep vein thrombosis (DVT) of femoral veins (H[facility]) [I82.413] 09/05/2015  . Bilateral pulmonary embolism ([place]) [I26.99] 09/02/2015  . Labile hypertension [I10]   . Pulmonary embolism with acute cor pulmonale ([place]) [I26.09]   . Dyspnea [R06.00]   . Orthostatic hypotension [I95.1]   . Bipolar disorder, in partial remission, most recent episode depressed ([place]) [F31.75]   . Syncope, near [R55] 08/31/2015  . Affective psychosis, bipolar ([place]) [F31.9]   . Bipolar disorder, now depressed ([place]) [F31.30] 08/28/2015  . Bipolar I disorder, severe, current or most recent episode depressed, with psychotic features ([place]) [F31.5]   . Pressure ulcer [L89.90] 07/30/2015  . Protein-calorie malnutrition, severe ([place]) [E43] 07/30/2015  . Dehydration [E86.0] 07/29/2015  . Bipolar disorder, current episode depressed, severe, with psychotic features ([place]) [F31.5] 07/09/2015  . Diabetes ([place]) [E11.9] 07/09/2015  . UTI (urinary tract infection) [N39.0] 06/19/2014  . Positive blood culture [R78.81] 06/19/2014  . Bipolar affective disorder, current episode manic with psychotic symptoms ([place]) [F31.2] 06/17/2014  . Type II or unspecified type diabetes  mellitus with unspecified complication, uncontrolled [E11.8, E11.65] 06/02/2014  . Other and unspecified hyperlipidemia [E78.5] 06/02/2014  . OSA on CPAP [G47.33] 06/02/2014  . 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 Gout [M10.9] 06/02/2014  . Hypertension [I10]    Total Time spent with patient: 30 minutes  Past Psychiatric History: Long-standing history of bipolar disorder currently a long severe depression with psychotic features responding to ECT. Patient has had difficulty responding which has been getting worse with time as he has become more chronically impaired. Multiple suicide attempts in the past. Multiple hospitalizations.  Past Medical History:  Past Medical History  Diagnosis Date  . Mood swings ([place])   . Type II or unspecified type diabetes mellitus with unspecified complication, uncontrolled 06/02/2014  . Other and unspecified hyperlipidemia 06/02/2014  . OSA on CPAP 06/02/2014  . 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 Bipolar disorder ([place])   . Hypertension   . Gout   . Bipolar 1 disorder ([place])    History reviewed. No pertinent past surgical history. Family History:  Family History  Problem Relation Age of Onset  . Dementia Mother   . Arthritis Father   . Heart failure Neg Hx   . Kidney failure Neg Hx   . Cancer Neg Hx    Family Psychiatric  History: Positive family history of depression and possibly bipolar disorder Social History:  History  Alcohol Use No     History  Drug Use No    Social History   Social History  . Marital Status: Married    Spouse Name: N/A  . Number of Children: N/A  . Years of Education: N/A  Social History Main Topics  . Smoking status: Never Smoker   . Smokeless tobacco: None  . Alcohol Use: No  . Drug Use: No  . Sexual Activity: Not Currently   Other Topics Concern  . None   Social History Narrative   *The patient has been married  for over 30 years and is currently on disability. He lives with his wife in the [place] area and has one daughter.             Additional Social History:                         Sleep: Good  Appetite:  Good  Current Medications: Current Facility-Administered Medications  Medication Dose Route Frequency Provider Last Rate Last Dose  . acetaminophen (TYLENOL) tablet 650 mg  650 mg Oral Q6H PRN Gonzella Lex, MD      . allopurinol (ZYLOPRIM) tablet 300 mg  300 mg Oral Daily Gonzella Lex, MD   300 mg at 09/07/15 0949  . alum & mag hydroxide-simeth (MAALOX/MYLANTA) 200-200-20 MG/5ML suspension 30 mL  30 mL Oral Q4H PRN Gonzella Lex, MD      . apixaban (ELIQUIS) tablet 10 mg  10 mg Oral BID Gonzella Lex, MD   10 mg at 09/07/15 1050  . [START ON 09/09/2015] apixaban (ELIQUIS) tablet 5 mg  5 mg Oral BID Gonzella Lex, MD   5 mg at 09/06/15 1645  . aspirin EC tablet 81 mg  81 mg Oral Daily Gonzella Lex, MD   81 mg at 09/07/15 0949  . atorvastatin (LIPITOR) tablet 10 mg  10 mg Oral q1800 Gonzella Lex, MD   10 mg at 09/07/15 1648  . clonazePAM (KLONOPIN) tablet 1 mg  1 mg Oral QHS Gonzella Lex, MD   1 mg at 09/06/15 2209  . finasteride (PROSCAR) tablet 5 mg  5 mg Oral Daily Gonzella Lex, MD   5 mg at 09/07/15 0949  . insulin aspart (novoLOG) injection 0-15 Units  0-15 Units Subcutaneous TID WC Chauncey Mann, MD   3 Units at 09/07/15 1200  . insulin aspart (novoLOG) injection 0-5 Units  0-5 Units Subcutaneous QHS Chauncey Mann, MD   0 Units at 09/05/15 2259  . insulin aspart (novoLOG) injection 4 Units  4 Units Subcutaneous TID WC Chauncey Mann, MD   4 Units at 09/07/15 1650  . insulin glargine (LANTUS) injection 15 Units  15 Units Subcutaneous QHS Chauncey Mann, MD   15 Units at 09/06/15 2212  . [START ON 09/08/2015] lisinopril (PRINIVIL,ZESTRIL) tablet 10 mg  10 mg Oral Daily Karesa Maultsby T Curt Oatis, MD      . magnesium hydroxide (MILK OF MAGNESIA) suspension 30 mL  30 mL Oral  Daily PRN Gonzella Lex, MD      . midodrine (PROAMATINE) tablet 5 mg  5 mg Oral TID WC Gonzella Lex, MD   5 mg at 09/07/15 1649  . mirtazapine (REMERON) tablet 45 mg  45 mg Oral QHS Gonzella Lex, MD   45 mg at 09/06/15 2209  . OLANZapine (ZYPREXA) tablet 30 mg  30 mg Oral QHS Gonzella Lex, MD   30 mg at 09/06/15 2210  . polyethylene glycol (MIRALAX / GLYCOLAX) packet 17 g  17 g Oral Daily Gonzella Lex, MD   17 g at 09/06/15 0901  . tamsulosin (FLOMAX) capsule 0.4 mg  0.4 mg Oral  QPC supper Gonzella Lex, MD   0.4 mg at 09/07/15 1649  . traZODone (DESYREL) tablet 25 mg  25 mg Oral QHS Gonzella Lex, MD   25 mg at 09/06/15 2207    Lab Results:  Results for orders placed or performed during the hospital encounter of 09/05/15 (from the past 48 hour(s))  Glucose, capillary     Status: Abnormal   Collection Time: 09/05/15  9:31 PM  Result Value Ref Range   Glucose-Capillary 146 (H) 65 - 99 mg/dL  Glucose, capillary     Status: Abnormal   Collection Time: 09/06/15 11:16 AM  Result Value Ref Range   Glucose-Capillary 123 (H) 65 - 99 mg/dL   Comment 1 Notify RN   Glucose, capillary     Status: Abnormal   Collection Time: 09/06/15  4:36 PM  Result Value Ref Range   Glucose-Capillary 160 (H) 65 - 99 mg/dL   Comment 1 Notify RN   Glucose, capillary     Status: Abnormal   Collection Time: 09/06/15  8:21 PM  Result Value Ref Range   Glucose-Capillary 171 (H) 65 - 99 mg/dL   Comment 1 Notify RN    Comment 2 Document in Chart   Glucose, capillary     Status: Abnormal   Collection Time: 09/07/15  6:35 AM  Result Value Ref Range   Glucose-Capillary 117 (H) 65 - 99 mg/dL   Comment 1 Notify RN   Creatinine, serum     Status: None   Collection Time: 09/07/15  7:11 AM  Result Value Ref Range   Creatinine, Ser 1.11 0.61 - 1.24 mg/dL   GFR calc non Af Amer >60 >60 mL/min   GFR calc Af Amer >60 >60 mL/min    Comment: (NOTE) The eGFR has been calculated using the CKD EPI equation. This  calculation has not been validated in all clinical situations. eGFR's persistently <60 mL/min signify possible Chronic Kidney Disease.   CBC     Status: None   Collection Time: 09/07/15  7:11 AM  Result Value Ref Range   WBC 4.6 3.8 - 10.6 K/uL   RBC 4.59 4.40 - 5.90 MIL/uL   Hemoglobin 14.2 13.0 - 18.0 g/dL   HCT 41.9 40.0 - 52.0 %   MCV 91.3 80.0 - 100.0 fL   MCH 31.0 26.0 - 34.0 pg   MCHC 33.9 32.0 - 36.0 g/dL   RDW 13.6 11.5 - 14.5 %   Platelets 204 150 - 440 K/uL  Urinalysis complete, with microscopic ([facility] only)     Status: Abnormal   Collection Time: 09/07/15  7:30 AM  Result Value Ref Range   Color, Urine YELLOW (A) YELLOW   APPearance CLEAR (A) CLEAR   Glucose, UA NEGATIVE NEGATIVE mg/dL   Bilirubin Urine NEGATIVE NEGATIVE   Ketones, ur NEGATIVE NEGATIVE mg/dL   Specific Gravity, Urine 1.008 1.005 - 1.030   Hgb urine dipstick NEGATIVE NEGATIVE   pH 5.0 5.0 - 8.0   Protein, ur 30 (A) NEGATIVE mg/dL   Nitrite NEGATIVE NEGATIVE   Leukocytes, UA NEGATIVE NEGATIVE   RBC / HPF NONE SEEN 0 - 5 RBC/hpf   WBC, UA NONE SEEN 0 - 5 WBC/hpf   Bacteria, UA NONE SEEN NONE SEEN   Squamous Epithelial / LPF NONE SEEN NONE SEEN   Mucous PRESENT   Glucose, capillary     Status: Abnormal   Collection Time: 09/07/15 11:56 AM  Result Value Ref Range   Glucose-Capillary 168 (H) 65 -  99 mg/dL  Glucose, capillary     Status: Abnormal   Collection Time: 09/07/15  4:42 PM  Result Value Ref Range   Glucose-Capillary 177 (H) 65 - 99 mg/dL    Physical Findings: AIMS: Facial and Oral Movements Muscles of Facial Expression: None, normal Lips and Perioral Area: None, normal Jaw: None, normal Tongue: None, normal,Extremity Movements Upper (arms, wrists, hands, fingers): None, normal Lower (legs, knees, ankles, toes): None, normal, Trunk Movements Neck, shoulders, hips: None, normal, Overall Severity Severity of abnormal movements (highest score from questions above): None,  normal Incapacitation due to abnormal movements: None, normal Patient's awareness of abnormal movements (rate only patient's report): No Awareness, Dental Status Current problems with teeth and/or dentures?: No Does patient usually wear dentures?: No  CIWA:    COWS:     Musculoskeletal: Strength & Muscle Tone: decreased Gait & Station: normal Patient leans: N/A  Psychiatric Specialty Exam: Review of Systems  Constitutional: Positive for malaise/fatigue.  HENT: Negative.   Eyes: Negative.   Respiratory: Negative.   Cardiovascular: Negative.   Gastrointestinal: Negative.   Musculoskeletal: Positive for joint pain.  Skin: Negative.   Neurological: Positive for weakness.  Psychiatric/Behavioral: Positive for memory loss. Negative for depression, suicidal ideas, hallucinations and substance abuse. The patient is not nervous/anxious and does not have insomnia.     Blood pressure 90/54, pulse 124, temperature 98.5 F (36.9 C), temperature source Oral, resp. rate 20, height 6' (1.829 m), weight 97.523 kg (215 lb), SpO2 100 %.Body mass index is 29.15 kg/(m^2).  General Appearance: Casual  Eye Contact::  Fair  Speech:  Slow  Volume:  Decreased  Mood:  Dysphoric  Affect:  Depressed  Thought Process:  Tangential  Orientation:  Full (Time, Place, and Person)  Thought Content:  Negative  Suicidal Thoughts:  No  Homicidal Thoughts:  No  Memory:  Immediate;   Good Recent;   Fair Remote;   Fair  Judgement:  Impaired  Insight:  Shallow  Psychomotor Activity:  Decreased  Concentration:  Poor  Recall:  [employer] of Knowledge:Good  Language: Fair  Akathisia:  No  Handed:  Right  AIMS (if indicated):     Assets:  Desire for Improvement Financial Resources/Insurance Housing Intimacy Leisure Time Resilience Social Support  ADL's:  Intact  Cognition: Impaired,  Mild  Sleep:  Number of Hours: 7.25   Treatment Plan Summary: Daily contact with patient to assess and evaluate  symptoms and progress in treatment, Medication management and Plan Patient interviewed. Labs reviewed. Vital signs reviewed. Nursing notes reviewed. Social work notes reviewed. As far as his depression he looks like he may be sinking down a little bit. I have increased his antidepressive to a higher dose. I am also going to put in orders to have him get ECT tomorrow morning as part of an ongoing maintenance plan. Patient agrees to the plan.Patient's blood sugars continue to run a little bit high. May consider getting medicine consult to assist with that.Blood pressure was high consistently. Yesterday I started him on lisinopril and after that he had several low blood pressure readings including a low reading along with a feeling of dizziness this morning. I have cut the lisinopril dose back down.As far as discharge planning the patient is not very active with that. We are going to need to work with his wife on  planning what will be a safe disposition as I am hoping we may be able to move towards discharge this week.  Latifah Padin 09/07/2015,  4:52 PM

## 2015-09-07 NOTE — [facility] Group Notes (Signed)
[facility] LCSW Group Therapy  09/07/2015 3:53 PM  Type of Therapy:  Group Therapy  Participation Level:  Did Not Attend  Modes of Intervention:  Discussion, Education, Socialization and Support  Summary of Progress/Problems: Todays topic: Grudges  Patients will be encouraged to discuss their thoughts, feelings, and behaviors as to why one holds on to grudges and reasons why people have grudges. Patients will process the impact of grudges on their daily lives and identify thoughts and feelings related to holding grudges. Patients will identify feelings and thoughts related to what life would look like without grudges.   Christian [name] L Christian [name] MSW, LCSWA  09/07/2015, 3:53 PM 

## 2015-09-08 LAB — GLUCOSE, CAPILLARY
Glucose-Capillary: 131 mg/dL — ABNORMAL HIGH (ref 65–99)
Glucose-Capillary: 166 mg/dL — ABNORMAL HIGH (ref 65–99)
Glucose-Capillary: 219 mg/dL — ABNORMAL HIGH (ref 65–99)
Glucose-Capillary: 158 mg/dL — ABNORMAL HIGH (ref 65–99)

## 2015-09-08 NOTE — Progress Notes (Signed)
Recreation Therapy Notes  INPATIENT RECREATION THERAPY ASSESSMENT  Patient Details Name: WEYLYN VERCRUYSSE MRN: [id number] DOB: 1951/04/14 Today's Date: 09/08/2015  Patient Stressors: Other (Comment) (Bills)  Coping Skills:   Isolate, Avoidance, Music, Sports, Other (Comment) (Watch TV)  Personal Challenges: Communication, Concentration, Decision-Making, Expressing Yourself, Problem-Solving, Relationships, Self-Esteem/Confidence, Stress Management, Time Management, Trusting Others  Leisure Interests (2+):  Individual - TV, Individual - Other (Comment) (Play checkers)  Awareness of Community Resources:  No  Community Resources:     Current Use:    If no, Barriers?:    Patient Strengths:  "Can't do it."  Patient Identified Areas of Improvement:  Try to fit into the system better  Current Recreation Participation:  [place]  Patient Goal for Hospitalization:  To get out of here  [place] of Residence:  [place] of Residence:  [place]   Current SI (including self-harm):  No  Current HI:  No  Consent to Intern Participation: N/A   Jacquelynn Cree, LRT/CTRS 09/08/2015, 4:52 PM

## 2015-09-08 NOTE — Progress Notes (Signed)
Patient denies SI. Patient states that he was doing "ok" today. Patient c/o dizziness. Blood pressure assessed and recorded. Fluids encouraged. Patient walked to dayroom for snack and to get vitals. Q 15 min checks maintained during shift for safety. Will continue to monitor. Patient slept 8.15  Hours.

## 2015-09-08 NOTE — Discharge Summary (Signed)
Physician Discharge Summary Note  Patient:  Christian [name] is an 64 y.o., male MRN:  [id number] DOB:  Mar 27, 1951 Patient phone:  [phone] (home)  Patient address:   [address] [place] [address],  Total Time spent with patient: 20 minutes  Date of Admission:  08/27/2015 Date of Discharge: 07/29/2015  Reason for Admission: patient is being discharged from the psychiatry service so that he can be admitted to medicine. He has had newer onset falls and shortness of breath Principal Problem: Bipolar disorder, current episode depressed, severe, with psychotic features [facility]) Discharge Diagnoses: Patient Active Problem List   Diagnosis Date Noted  . Acute respiratory failure with hypoxia (HCC) [J96.01] 09/05/2015  . Elevated troponin [R79.89] 09/05/2015  . Somnolence [R40.0] 09/05/2015  . Bipolar depression (HCC) [F31.30] 09/05/2015  . Acute bilateral deep vein thrombosis (DVT) of femoral veins (HCC) [I82.413] 09/05/2015  . Bilateral pulmonary embolism (HCC) [I26.99] 09/02/2015  . Labile hypertension [I10]   . Pulmonary embolism with acute cor pulmonale (HCC) [I26.09]   . Dyspnea [R06.00]   . Orthostatic hypotension [I95.1]   . Bipolar disorder, in partial remission, most recent episode depressed (HCC) [F31.75]   . Syncope, near [R55] 08/31/2015  . Affective psychosis, bipolar (HCC) [F31.9]   . Bipolar disorder, now depressed (HCC) [F31.30] 08/28/2015  . Bipolar I disorder, severe, current or most recent episode depressed, with psychotic features (HCC) [F31.5]   . Pressure ulcer [L89.90] 07/30/2015  . Protein-calorie malnutrition, severe (HCC) [E43] 07/30/2015  . Dehydration [E86.0] 07/29/2015  . Bipolar disorder, current episode depressed, severe, with psychotic features (HCC) [F31.5] 07/09/2015  . Diabetes (HCC) [E11.9] 07/09/2015  . UTI (urinary tract infection) [N39.0] 06/19/2014  . Positive blood culture [R78.81] 06/19/2014  . Bipolar affective disorder, current episode manic  with psychotic symptoms (HCC) [F31.2] 06/17/2014  . Type II or unspecified type diabetes mellitus with unspecified complication, uncontrolled [E11.8, E11.65] 06/02/2014  . Other and unspecified hyperlipidemia [E78.5] 06/02/2014  . OSA on CPAP [G47.33] 06/02/2014  . 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 Gout [M10.9] 06/02/2014  . Hypertension [I10]     Musculoskeletal: Strength & Muscle Tone: decreased Gait & Station: unable to stand Patient leans: N/A  Psychiatric Specialty Exam: Physical Exam   Review of Systems  Constitutional: Negative.   HENT: Negative.   Eyes: Negative.   Respiratory: Negative.   Cardiovascular: Negative.   Gastrointestinal: Negative.   Musculoskeletal: Negative.   Skin: Negative.   Neurological: Negative.   Psychiatric/Behavioral: Positive for depression and memory loss. Negative for suicidal ideas, hallucinations and substance abuse. The patient has insomnia. The patient is not nervous/anxious.     Blood pressure 131/87, pulse 100, temperature 98.1 F (36.7 C), temperature source Oral, resp. rate 20, height 6' (1.829 m), weight 97.523 kg (215 lb), SpO2 100 %.Body mass index is 29.15 kg/(m^2).  General Appearance: Guarded  Eye Contact::  None  Speech:  Slow and Slurred  Volume:  Decreased  Mood:  Depressed and Irritable  Affect:  Depressed and Inappropriate  Thought Process:  Disorganized  Orientation:  Negative  Thought Content:  Delusions  Suicidal Thoughts:  Yes.  without intent/plan  Homicidal Thoughts:  No  Memory:  Immediate;   Poor Recent;   Poor Remote;   Poor  Judgement:  Impaired  Insight:  Shallow  Psychomotor Activity:  Decreased  Concentration:  Poor  Recall:  Poor  Fund of Knowledge:Fair  Language: Poor  Akathisia:  No  Handed:  Right  AIMS (if indicated):     Assets:  Financial Resources/Insurance Housing Resilience Social Support  ADL's:  Impaired  Cognition:  Impaired,  Moderate  Sleep:  Number of Hours: 5.5   Have you used any form of tobacco in the last 30 days? (Cigarettes, Smokeless Tobacco, Cigars, and/or Pipes): No  Has this patient used any form of tobacco in the last 30 days? (Cigarettes, Smokeless Tobacco, Cigars, and/or Pipes) No  Past Medical History:  Past Medical History  Diagnosis Date  . Mood swings (HCC)   . Type II or unspecified type diabetes mellitus with unspecified complication, uncontrolled 06/02/2014  . Other and unspecified hyperlipidemia 06/02/2014  . OSA on CPAP 06/02/2014  . 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 Bipolar disorder (HCC)   . Hypertension   . Gout   . Bipolar 1 disorder (HCC)    History reviewed. No pertinent past surgical history. Family History:  Family History  Problem Relation Age of Onset  . Dementia Mother   . Arthritis Father   . Heart failure Neg Hx   . Kidney failure Neg Hx   . Cancer Neg Hx    Social History:  History  Alcohol Use No     History  Drug Use No    Social History   Social History  . Marital Status: Married    Spouse Name: N/A  . Number of Children: N/A  . Years of Education: N/A   Social History Main Topics  . Smoking status: Never Smoker   . Smokeless tobacco: None  . Alcohol Use: No  . Drug Use: No  . Sexual Activity: Not Currently   Other Topics Concern  . None   Social History Narrative   *The patient has been married for over 30 years and is currently on disability. He lives with his wife in the [place] area and has one daughter.              Past Psychiatric History: Hospitalizations:  Outpatient Care:  Substance Abuse Care:  Self-Mutilation:  Suicidal Attempts:  Violent Behaviors:   Risk to Self: Is patient at risk for suicide?: Yes Risk to Others:   Prior Inpatient Therapy:   Prior Outpatient Therapy:    Level of Care:  [facility]  Hospital Course:  Patient had been admitted to the psychiatry  service for continued treatment of severe depression symptoms of bipolar disorder. He is continuing to get ECT treatment but is being transferred now to medicine because of new onset falls and shortness of breath. Mood continues to show some improvement although not yet back to baseline. Consults:  Hospitalist, anesthesia  Significant Diagnostic Studies:  labs: Blood sugars up otherwise largely unremarkable microbiology: wound culture: negative cardiac graphics: ECG: Unremarkable  Discharge Vitals:   Blood pressure 131/87, pulse 100, temperature 98.1 F (36.7 C), temperature source Oral, resp. rate 20, height 6' (1.829 m), weight 97.523 kg (215 lb), SpO2 100 %. Body mass index is 29.15 kg/(m^2). Lab Results:   No results found for this or any previous visit (from the past 72 hour(s)).  Physical Findings: AIMS:  , ,  ,  ,    CIWA:    COWS:      See Psychiatric Specialty Exam and Suicide Risk Assessment completed by Attending Physician prior to discharge.  Discharge destination:  Other:  Patient is being transferred to the internal medicine service  Is patient on multiple antipsychotic therapies at discharge:  No   Has  Patient had three or more failed trials of antipsychotic monotherapy by history:  No    Recommended Plan for Multiple Antipsychotic Therapies: NA     Medication List    STOP taking these medications        amLODipine 10 MG tablet  Commonly known as:  NORVASC     colchicine 0.6 MG tablet     diltiazem 120 MG 24 hr capsule  Commonly known as:  CARDIZEM CD     furosemide 20 MG tablet  Commonly known as:  LASIX     hydrALAZINE 25 MG tablet  Commonly known as:  APRESOLINE     lisinopril 10 MG tablet  Commonly known as:  PRINIVIL,ZESTRIL     megestrol 400 MG/10ML suspension  Commonly known as:  MEGACE     metoprolol 200 MG 24 hr tablet  Commonly known as:  TOPROL-XL      TAKE these medications      Indication   allopurinol 300 MG tablet   Commonly known as:  ZYLOPRIM  Take 1 tablet (300 mg total) by mouth daily. For gout   Indication:  Gout     aspirin EC 81 MG tablet  Take 81 mg by mouth daily.      atorvastatin 10 MG tablet  Commonly known as:  LIPITOR  Take 1 tablet (10 mg total) by mouth daily. For high cholesterol   Indication:  Inherited Heterozygous Hypercholesterolemia     cholecalciferol 400 UNITS Tabs tablet  Commonly known as:  VITAMIN D  Take 800 Units by mouth daily.      clonazePAM 1 MG tablet  Commonly known as:  KLONOPIN  Take 1 tablet (1 mg total) by mouth at bedtime.   Indication:  insomnia.     docusate sodium 100 MG capsule  Commonly known as:  COLACE  Take 2 capsules (200 mg total) by mouth 2 (two) times daily.      feeding supplement (GLUCERNA SHAKE) Liqd  Take 237 mLs by mouth 3 (three) times daily with meals.      finasteride 5 MG tablet  Commonly known as:  PROSCAR  Take 1 tablet (5 mg total) by mouth daily.      insulin aspart 100 UNIT/ML injection  Commonly known as:  novoLOG  Inject 0-9 Units into the skin 3 (three) times daily with meals.      insulin glargine 100 UNIT/ML injection  Commonly known as:  LANTUS  Inject 0.12 mLs (12 Units total) into the skin at bedtime.      mirtazapine 30 MG tablet  Commonly known as:  REMERON  Take 30 mg by mouth at bedtime.      olanzapine zydis 15 MG disintegrating tablet  Commonly known as:  ZYPREXA  Take 2 tablets (30 mg total) by mouth at bedtime.      ondansetron 4 MG tablet  Commonly known as:  ZOFRAN  Take 1 tablet (4 mg total) by mouth every 6 (six) hours as needed for nausea.      polyethylene glycol packet  Commonly known as:  MIRALAX / GLYCOLAX  Take 17 g by mouth daily.      tamsulosin 0.4 MG Caps capsule  Commonly known as:  FLOMAX  Take 1 capsule (0.4 mg total) by mouth at bedtime.      traZODone 50 MG tablet  Commonly known as:  DESYREL  Take 0.5 tablets (25 mg total) by mouth 3 (three) times daily.    Indication:  Aggressive Behavior  Follow-up recommendations:  Activity:  Physical therapy will be requested to increase activity Diet:  Diet regular as much as he can eat Tests:  Continue monitoring blood sugars Other:  Patient will continue ECT treatment and I will continue to follow-up  Comments:  Appreciate assistance of hospitalist service  Total Discharge Time: 15 minutes  Signed: [name] 09/08/2015, 5:20 PM

## 2015-09-08 NOTE — Plan of Care (Signed)
Problem: Consults Goal: Depression Patient Education See Patient Education Module for education specifics.  Outcome: Not Progressing Patient remains with depressed affect, withdrawn behavior. Speech slow and quiet, minimal interaction with peers. Does not initiate interactions.

## 2015-09-08 NOTE — Progress Notes (Signed)
Initial Nutrition Assessment    INTERVENTION:   Meals and Snacks: Cater to patient preferences Medical Food Supplement Therapy: pt has received Glucerna shakes on previous admissions; if po intake inadequate, recommend restarting  NUTRITION DIAGNOSIS:   No nutrition diagnosis at this time  GOAL:   Patient will meet greater than or equal to 90% of their needs   MONITOR:    (Energy Intake, Anthropometrics, Electrolyte/Renal Profile, Glucose Profile)  REASON FOR ASSESSMENT:   Malnutrition Screening Tool    ASSESSMENT:    Pt with bipolar disorder, now depressed; receiving ECT  Past Medical History  Diagnosis Date  . Mood swings (HCC)   . Type II or unspecified type diabetes mellitus with unspecified complication, uncontrolled 06/02/2014  . Other and unspecified hyperlipidemia 06/02/2014  . OSA on CPAP 06/02/2014  . 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 Bipolar disorder (HCC)   . Hypertension   . Gout   . Bipolar 1 disorder (HCC)      Diet Order:  Diet Carb Modified Fluid consistency:: Thin; Room service appropriate?: Yes  Energy Intake: pt eating 80-100% of meals, appetite good at present. Of note, pt has previously had poor po intake with wt loss previously meeting criteria for malnutrition.   Skin:   (deep tissue injury left heel)  Glucose Profile:  Recent Labs  09/07/15 2019 09/08/15 0645 09/08/15 1147  GLUCAP 228* 131* 166*   Meds: midodrine, remeron, lantus, ss novolog, novolog with meals  Height:   Ht Readings from Last 1 Encounters:  09/05/15 6' (1.829 m)    Weight: weight trend as per weight encounters; weight appears relatively stable as of late  Wt Readings from Last 1 Encounters:  09/05/15 215 lb (97.523 kg)   Filed Weights   09/05/15 1827  Weight: 215 lb (97.523 kg)   Wt Readings from Last 10 Encounters:  09/05/15 215 lb (97.523 kg)  08/31/15 215 lb 11.2 oz (97.841 kg)  08/29/15 215 lb (97.523  kg)  08/27/15 215 lb (97.523 kg)  07/28/15 207 lb 6.4 oz (94.076 kg)  07/12/15 225 lb 8 oz (102.286 kg)  07/09/15 222 lb (100.699 kg)  07/08/15 243 lb (110.224 kg)  06/28/15 234 lb (106.142 kg)  07/16/14 258 lb (117.028 kg)    BMI:  Body mass index is 29.15 kg/(m^2).  LOW Care Level  Romelle Starcher MS, [place], LDN [phone] Pager

## 2015-09-08 NOTE — Progress Notes (Signed)
MD orders consult for wound consult writer phones [phone], office closed at 4:30 and no answer machine. MD orders PT consult, writer phones PT (x 3) and is transferred to inpatient therapist phones, no answer machine.

## 2015-09-08 NOTE — [facility] Group Notes (Signed)
[facility] LCSW Aftercare Discharge Planning Group Note   09/08/2015 1:21 PM  Participation Quality:  Patient did not attend group   Christian [name], MSW, LCSWA   

## 2015-09-08 NOTE — [facility] Group Notes (Signed)
[facility] Group Notes:  (Nursing/MHT/Case Management/Adjunct)  Date:  09/08/2015  Time:  2:08 PM  Type of Therapy:  Psychoeducational Skills  Participation Level:  Did Not Attend   Marquette Old 09/08/2015, 2:08 PM

## 2015-09-08 NOTE — Plan of Care (Signed)
Problem: Alteration in mood Goal: LTG-Pt's behavior demonstrates decreased signs of depression (Patient's behavior demonstrates decreased signs of depression to the point the patient is safe to return home and continue treatment in an outpatient setting)  Outcome: Progressing Patient denies depression and states today is a good day.

## 2015-09-08 NOTE — Progress Notes (Signed)
Patient NPO this am for ECT. Writer phones CT and it was stated that patient was not on ECT schedule. Patient eats am meal, receives insulin as ordered and has am meds. Good adls, good appetite. Affect depressed, mood withdrawn. Remains 1:1 for transfers and safety is maintained.

## 2015-09-08 NOTE — Progress Notes (Signed)
Recreation Therapy Notes  Date: 10.10.16 Time: 3:00 pm Location: Craft Room  Group Topic: Self-expression  Goal Area(s) Addresses:  Patient will identify one color per emotion listed on wheel.  Patient will verbalize benefit of using art as a means of self-expression. Patient will verbalize one emotion experienced during session. Patient will be educated on other forms of self-expression.  Behavioral Response: Did not attend  Intervention: Emotion Wheel  Activity: Patients were given a worksheet with 7 different emotions. Patients were instructed to pick a color for each emotion.   Education: LRT educated patients on different forms of self-expression.  Education Outcome: Patient did not attend group.  Clinical Observations/Feedback: Patient did not attend group.  Jacquelynn Cree, LRT/CTRS 09/08/2015 4:21 PM

## 2015-09-08 NOTE — Progress Notes (Signed)
ANTICOAGULATION CONSULT NOTE - Initial Consult  Pharmacy Consult for Apixaban Indication: pulmonary embolus  No Known Allergies  Patient Measurements: Height: 6' (182.9 cm) Weight: 215 lb (97.523 kg) IBW/kg (Calculated) : 77.6 Heparin Dosing Weight:   Vital Signs: BP: 107/76 mmHg (10/10 0700) Pulse Rate: 139 (10/10 0700)  Labs:  Recent Labs  09/07/15 0711  HGB 14.2  HCT 41.9  PLT 204  CREATININE 1.11    Estimated Creatinine Clearance: 83.5 mL/min (by C-G formula based on Cr of 1.11).   Medical History: Past Medical History  Diagnosis Date  . Mood swings (HCC)   . Type II or unspecified type diabetes mellitus with unspecified complication, uncontrolled 06/02/2014  . Other and unspecified hyperlipidemia 06/02/2014  . OSA on CPAP 06/02/2014  . 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 Bipolar disorder (HCC)   . Hypertension   . Gout   . Bipolar 1 disorder (HCC)     Medications:  Prescriptions prior to admission  Medication Sig Dispense Refill Last Dose  . allopurinol (ZYLOPRIM) 300 MG tablet Take 1 tablet (300 mg total) by mouth daily. For gout   Past Month at Unknown time  . apixaban (ELIQUIS) 5 MG TABS tablet Take 2 tablets (10 mg total) by mouth 2 (two) times daily. 28 tablet 0   . [START ON 09/09/2015] apixaban (ELIQUIS) 5 MG TABS tablet Take 1 tablet (5 mg total) by mouth 2 (two) times daily. 60 tablet 6   . aspirin EC 81 MG tablet Take 81 mg by mouth daily.   Past Month at Unknown time  . atorvastatin (LIPITOR) 10 MG tablet Take 1 tablet (10 mg total) by mouth daily. For high cholesterol   Past Month at Unknown time  . cholecalciferol (VITAMIN D) 400 UNITS TABS tablet Take 800 Units by mouth daily.   Past Month at Unknown time  . clonazePAM (KLONOPIN) 1 MG tablet Take 1 tablet (1 mg total) by mouth at bedtime. 30 tablet 0   . docusate sodium (COLACE) 100 MG capsule Take 2 capsules (200 mg total) by mouth 2 (two) times daily. 10  capsule 0   . feeding supplement, GLUCERNA SHAKE, (GLUCERNA SHAKE) LIQD Take 237 mLs by mouth 3 (three) times daily with meals.  0   . finasteride (PROSCAR) 5 MG tablet Take 1 tablet (5 mg total) by mouth daily. 1 tablet 0   . insulin aspart (NOVOLOG) 100 UNIT/ML injection Inject 0-9 Units into the skin 3 (three) times daily with meals. 10 mL 11   . insulin aspart (NOVOLOG) 100 UNIT/ML injection Inject 5 Units into the skin 3 (three) times daily with meals. 10 mL 11   . insulin aspart (NOVOLOG) 100 UNIT/ML injection Inject 0-9 Units into the skin 3 (three) times daily with meals. 10 mL 11   . insulin glargine (LANTUS) 100 UNIT/ML injection Inject 0.12 mLs (12 Units total) into the skin at bedtime. 10 mL 11   . levofloxacin (LEVAQUIN) 750 MG tablet Take 1 tablet (750 mg total) by mouth daily. Please continue for 3 more days to complete course 3 tablet 0   . midodrine (PROAMATINE) 5 MG tablet Take 1 tablet (5 mg total) by mouth 3 (three) times daily with meals. 90 tablet 6   . mirtazapine (REMERON) 30 MG tablet Take 30 mg by mouth at bedtime.   07/24/2015 at Unknown time  . OLANZapine zydis (ZYPREXA) 15 MG disintegrating tablet Take 2 tablets (30 mg total) by mouth at bedtime.  1 tablet 0   . ondansetron (ZOFRAN) 4 MG tablet Take 1 tablet (4 mg total) by mouth every 6 (six) hours as needed for nausea. 20 tablet 0   . polyethylene glycol (MIRALAX / GLYCOLAX) packet Take 17 g by mouth daily. 14 each 0   . tamsulosin (FLOMAX) 0.4 MG CAPS capsule Take 1 capsule (0.4 mg total) by mouth at bedtime. 30 capsule 9   . traZODone (DESYREL) 50 MG tablet Take 0.5 tablets (25 mg total) by mouth 3 (three) times daily. (Patient taking differently: Take 200 mg by mouth at bedtime. )       Assessment: CrCl = 111.7 ml/min Pt was started on Apixaban 10 mg PO BID X 7 on 10/4.  Pt was d/c'd on 10/7 AM but has returned this evening 10/7 for bipolar disorder.  Goal of Therapy:  Resolution of PE    Plan:  Eliquis 10 mg  PO BID X 7D started on 10/4.  Will continue with Eliquis 10 mg PO BID for 7 more doses (through 10/10). Will start Eliquis  PO BID on 10/11.   10/10: Patient on Apixaban  bid for PE with transition to 5 mg bid tomorrow 10/11. F/U labs.  Bari Mantis PharmD Clinical Pharmacist 09/08/2015  10:13 AM

## 2015-09-08 NOTE — Progress Notes (Addendum)
[facility] MD Progress Note  09/08/2015 4:17 PM Christian [name]  MRN:  [id number] Subjective: Patient continues to complain of feeling "bad". He says that he just feels negative. He denies having any suicidal thoughts. He is not very talkative. Pretty much the same as he was yesterday. Denies hallucinations. Still having pain in his shoulders. Sleep is only adequate. Appetite is okay. Patient is not very active in conversing about his treatment plan. Update as of Monday. He still stays very passive. Although he is more alert and interactive than he was on first admission he is back to being very physically lethargic and doesn't do anything unless specifically prodded. Patient denies suicidal ideation. We had wanted to do ECT today but because of a mix up and some confusion and it was not done. Principal Problem: Bipolar disorder, now depressed [facility]) Diagnosis:   Patient Active Problem List   Diagnosis Date Noted  . Acute respiratory failure with hypoxia ([place]) [J96.01] 09/05/2015  . Elevated troponin [R79.89] 09/05/2015  . Somnolence [R40.0] 09/05/2015  . Bipolar depression ([place]) [F31.30] 09/05/2015  . Acute bilateral deep vein thrombosis (DVT) of femoral veins ([facility]) [I82.413] 09/05/2015  . Bilateral pulmonary embolism ([place]) [I26.99] 09/02/2015  . Labile hypertension [I10]   . Pulmonary embolism with acute cor pulmonale ([place]) [I26.09]   . Dyspnea [R06.00]   . Orthostatic hypotension [I95.1]   . Bipolar disorder, in partial remission, most recent episode depressed ([place]) [F31.75]   . Syncope, near [R55] 08/31/2015  . Affective psychosis, bipolar ([place]) [F31.9]   . Bipolar disorder, now depressed ([place]) [F31.30] 08/28/2015  . Bipolar I disorder, severe, current or most recent episode depressed, with psychotic features ([place]) [F31.5]   . Pressure ulcer [L89.90] 07/30/2015  . Protein-calorie malnutrition, severe ([place]) [E43] 07/30/2015  . Dehydration [E86.0] 07/29/2015  . Bipolar disorder, current episode  depressed, severe, with psychotic features ([place]) [F31.5] 07/09/2015  . Diabetes ([place]) [E11.9] 07/09/2015  . UTI (urinary tract infection) [N39.0] 06/19/2014  . Positive blood culture [R78.81] 06/19/2014  . Bipolar affective disorder, current episode manic with psychotic symptoms ([place]) [F31.2] 06/17/2014  . Type II or unspecified type diabetes mellitus with unspecified complication, uncontrolled [E11.8, E11.65] 06/02/2014  . Other and unspecified hyperlipidemia [E78.5] 06/02/2014  . OSA on CPAP [G47.33] 06/02/2014  . 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 Gout [M10.9] 06/02/2014  . Hypertension [I10]    Total Time spent with patient: 30 minutes  Past Psychiatric History: Long-standing history of bipolar disorder currently a long severe depression with psychotic features responding to ECT. Patient has had difficulty responding which has been getting worse with time as he has become more chronically impaired. Multiple suicide attempts in the past. Multiple hospitalizations.  Past Medical History:  Past Medical History  Diagnosis Date  . Mood swings ([place])   . Type II or unspecified type diabetes mellitus with unspecified complication, uncontrolled 06/02/2014  . Other and unspecified hyperlipidemia 06/02/2014  . OSA on CPAP 06/02/2014  . 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 Bipolar disorder ([place])   . Hypertension   . Gout   . Bipolar 1 disorder ([place])    History reviewed. No pertinent past surgical history. Family History:  Family History  Problem Relation Age of Onset  . Dementia Mother   . Arthritis Father   . Heart failure Neg Hx   . Kidney failure Neg Hx   . Cancer Neg Hx    Family Psychiatric  History: Positive family history of depression and possibly bipolar disorder Social History:  History  Alcohol Use No     History  Drug Use No    Social History   Social History  . Marital  Status: Married    Spouse Name: N/A  . Number of Children: N/A  . Years of Education: N/A   Social History Main Topics  . Smoking status: Never Smoker   . Smokeless tobacco: None  . Alcohol Use: No  . Drug Use: No  . Sexual Activity: Not Currently   Other Topics Concern  . None   Social History Narrative   *The patient has been married for over 30 years and is currently on disability. He lives with his wife in the [place] area and has one daughter.             Additional Social History:                         Sleep: Good  Appetite:  Good  Current Medications: Current Facility-Administered Medications  Medication Dose Route Frequency Provider Last Rate Last Dose  . acetaminophen (TYLENOL) tablet 650 mg  650 mg Oral Q6H PRN Gonzella Lex, MD   650 mg at 09/08/15 0900  . allopurinol (ZYLOPRIM) tablet 300 mg  300 mg Oral Daily Gonzella Lex, MD   300 mg at 09/08/15 0859  . alum & mag hydroxide-simeth (MAALOX/MYLANTA) 200-200-20 MG/5ML suspension 30 mL  30 mL Oral Q4H PRN Gonzella Lex, MD      . apixaban (ELIQUIS) tablet 10 mg  10 mg Oral BID Gonzella Lex, MD   10 mg at 09/08/15 0902  . [START ON 09/09/2015] apixaban (ELIQUIS) tablet 5 mg  5 mg Oral BID Gonzella Lex, MD   5 mg at 09/06/15 1645  . aspirin EC tablet 81 mg  81 mg Oral Daily Gonzella Lex, MD   81 mg at 09/08/15 0900  . atorvastatin (LIPITOR) tablet 10 mg  10 mg Oral q1800 Gonzella Lex, MD   10 mg at 09/07/15 1648  . clonazePAM (KLONOPIN) tablet 1 mg  1 mg Oral QHS Gonzella Lex, MD   1 mg at 09/07/15 2128  . finasteride (PROSCAR) tablet 5 mg  5 mg Oral Daily Gonzella Lex, MD   5 mg at 09/08/15 0905  . insulin aspart (novoLOG) injection 0-15 Units  0-15 Units Subcutaneous TID WC Chauncey Mann, MD   3 Units at 09/08/15 1225  . insulin aspart (novoLOG) injection 0-5 Units  0-5 Units Subcutaneous QHS Chauncey Mann, MD   2 Units at 09/07/15 2130  . insulin aspart (novoLOG) injection 4 Units  4  Units Subcutaneous TID WC Chauncey Mann, MD   4 Units at 09/08/15 1224  . insulin glargine (LANTUS) injection 15 Units  15 Units Subcutaneous QHS Chauncey Mann, MD   15 Units at 09/07/15 2129  . lisinopril (PRINIVIL,ZESTRIL) tablet 10 mg  10 mg Oral Daily Gonzella Lex, MD   10 mg at 09/08/15 0900  . magnesium hydroxide (MILK OF MAGNESIA) suspension 30 mL  30 mL Oral Daily PRN Gonzella Lex, MD      . midodrine (PROAMATINE) tablet 5 mg  5 mg Oral TID WC Gonzella Lex, MD   5 mg at 09/08/15 1223  . mirtazapine (REMERON) tablet 45 mg  45 mg Oral QHS Gonzella Lex, MD   [phone]  mg at 09/07/15 2127  . OLANZapine (ZYPREXA) tablet 30 mg  30 mg Oral QHS Gonzella Lex, MD   30 mg at 09/07/15 2126  . polyethylene glycol (MIRALAX / GLYCOLAX) packet 17 g  17 g Oral Daily Gonzella Lex, MD   17 g at 09/08/15 0901  . tamsulosin (FLOMAX) capsule 0.4 mg  0.4 mg Oral QPC supper Gonzella Lex, MD   0.4 mg at 09/07/15 1649  . traZODone (DESYREL) tablet 25 mg  25 mg Oral QHS Gonzella Lex, MD   25 mg at 09/07/15 2127    Lab Results:  Results for orders placed or performed during the hospital encounter of 09/05/15 (from the past 48 hour(s))  Glucose, capillary     Status: Abnormal   Collection Time: 09/06/15  4:36 PM  Result Value Ref Range   Glucose-Capillary 160 (H) 65 - 99 mg/dL   Comment 1 Notify RN   Glucose, capillary     Status: Abnormal   Collection Time: 09/06/15  8:21 PM  Result Value Ref Range   Glucose-Capillary 171 (H) 65 - 99 mg/dL   Comment 1 Notify RN    Comment 2 Document in Chart   Glucose, capillary     Status: Abnormal   Collection Time: 09/07/15  6:35 AM  Result Value Ref Range   Glucose-Capillary 117 (H) 65 - 99 mg/dL   Comment 1 Notify RN   Creatinine, serum     Status: None   Collection Time: 09/07/15  7:11 AM  Result Value Ref Range   Creatinine, Ser 1.11 0.61 - 1.24 mg/dL   GFR calc non Af Amer >60 >60 mL/min   GFR calc Af Amer >60 >60 mL/min    Comment: (NOTE) The eGFR  has been calculated using the CKD EPI equation. This calculation has not been validated in all clinical situations. eGFR's persistently <60 mL/min signify possible Chronic Kidney Disease.   CBC     Status: None   Collection Time: 09/07/15  7:11 AM  Result Value Ref Range   WBC 4.6 3.8 - 10.6 K/uL   RBC 4.59 4.40 - 5.90 MIL/uL   Hemoglobin 14.2 13.0 - 18.0 g/dL   HCT 41.9 40.0 - 52.0 %   MCV 91.3 80.0 - 100.0 fL   MCH 31.0 26.0 - 34.0 pg   MCHC 33.9 32.0 - 36.0 g/dL   RDW 13.6 11.5 - 14.5 %   Platelets 204 150 - 440 K/uL  Urinalysis complete, with microscopic ([facility] only)     Status: Abnormal   Collection Time: 09/07/15  7:30 AM  Result Value Ref Range   Color, Urine YELLOW (A) YELLOW   APPearance CLEAR (A) CLEAR   Glucose, UA NEGATIVE NEGATIVE mg/dL   Bilirubin Urine NEGATIVE NEGATIVE   Ketones, ur NEGATIVE NEGATIVE mg/dL   Specific Gravity, Urine 1.008 1.005 - 1.030   Hgb urine dipstick NEGATIVE NEGATIVE   pH 5.0 5.0 - 8.0   Protein, ur 30 (A) NEGATIVE mg/dL   Nitrite NEGATIVE NEGATIVE   Leukocytes, UA NEGATIVE NEGATIVE   RBC / HPF NONE SEEN 0 - 5 RBC/hpf   WBC, UA NONE SEEN 0 - 5 WBC/hpf   Bacteria, UA NONE SEEN NONE SEEN   Squamous Epithelial / LPF NONE SEEN NONE SEEN   Mucous PRESENT   Glucose, capillary     Status: Abnormal   Collection Time: 09/07/15 11:56 AM  Result Value Ref Range   Glucose-Capillary 168 (H) 65 - 99 mg/dL  Glucose, capillary     Status: Abnormal   Collection Time: 09/07/15  4:42 PM  Result Value Ref Range   Glucose-Capillary 177 (H) 65 - 99 mg/dL  Glucose, capillary     Status: Abnormal   Collection Time: 09/07/15  8:19 PM  Result Value Ref Range   Glucose-Capillary 228 (H) 65 - 99 mg/dL  Glucose, capillary     Status: Abnormal   Collection Time: 09/08/15  6:45 AM  Result Value Ref Range   Glucose-Capillary 131 (H) 65 - 99 mg/dL  Glucose, capillary     Status: Abnormal   Collection Time: 09/08/15 11:47 AM  Result Value Ref Range    Glucose-Capillary 166 (H) 65 - 99 mg/dL   Comment 1 Notify RN     Physical Findings: AIMS: Facial and Oral Movements Muscles of Facial Expression: None, normal Lips and Perioral Area: None, normal Jaw: None, normal Tongue: None, normal,Extremity Movements Upper (arms, wrists, hands, fingers): None, normal Lower (legs, knees, ankles, toes): None, normal, Trunk Movements Neck, shoulders, hips: None, normal, Overall Severity Severity of abnormal movements (highest score from questions above): None, normal Incapacitation due to abnormal movements: None, normal Patient's awareness of abnormal movements (rate only patient's report): No Awareness, Dental Status Current problems with teeth and/or dentures?: No Does patient usually wear dentures?: No  CIWA:    COWS:     Musculoskeletal: Strength & Muscle Tone: decreased Gait & Station: normal Patient leans: N/A  Psychiatric Specialty Exam: Review of Systems  Constitutional: Positive for malaise/fatigue.  HENT: Negative.   Eyes: Negative.   Respiratory: Negative.   Cardiovascular: Negative.   Gastrointestinal: Negative.   Musculoskeletal: Positive for joint pain.  Skin: Negative.   Neurological: Positive for weakness.  Psychiatric/Behavioral: Positive for memory loss. Negative for depression, suicidal ideas, hallucinations and substance abuse. The patient is not nervous/anxious and does not have insomnia.     Blood pressure 107/76, pulse 139, temperature 98.5 F (36.9 C), temperature source Oral, resp. rate 20, height 6' (1.829 m), weight 97.523 kg (215 lb), SpO2 100 %.Body mass index is 29.15 kg/(m^2).  General Appearance: Casual  Eye Contact::  Fair  Speech:  Slow  Volume:  Decreased  Mood:  Dysphoric  Affect:  Depressed  Thought Process:  Tangential  Orientation:  Full (Time, Place, and Person)  Thought Content:  Negative  Suicidal Thoughts:  No  Homicidal Thoughts:  No  Memory:  Immediate;   Good Recent;   Fair Remote;    Fair  Judgement:  Impaired  Insight:  Shallow  Psychomotor Activity:  Decreased  Concentration:  Poor  Recall:  [employer] of Knowledge:Good  Language: Fair  Akathisia:  No  Handed:  Right  AIMS (if indicated):     Assets:  Desire for Improvement Financial Resources/Insurance Housing Intimacy Leisure Time Resilience Social Support  ADL's:  Intact  Cognition: Impaired,  Mild  Sleep:  Number of Hours: 8.15   Treatment Plan Summary: Daily contact with patient to assess and evaluate symptoms and progress in treatment, Medication management and Plan Once again reviewed with the patient our goals for discharge. Tried to get him to show some evidence of wanting to make an effort to regain his energy. No acute change to medication. I am reconsulting physical therapy for both treatment and advice about an appropriate level of placement. I am consulting wound care for evaluation of his heel wound and appropriate treatment. We will plan for ECT on Wednesday. I have talked about this with social  work and we need to get together with his wife to come up with a discharge plan I would hope by later in the week. Additionally, I note that his blood pressure is under better control. Not having the low dips in blood pressure. Sugars are looking under better control as well. Patient continues on medicine for his recent pulmonary embolism. Solaris Kram 09/08/2015, 4:17 PM

## 2015-09-09 LAB — GLUCOSE, CAPILLARY
Glucose-Capillary: 99 mg/dL (ref 65–99)
Glucose-Capillary: 132 mg/dL — ABNORMAL HIGH (ref 65–99)
Glucose-Capillary: 154 mg/dL — ABNORMAL HIGH (ref 65–99)
Glucose-Capillary: 167 mg/dL — ABNORMAL HIGH (ref 65–99)
Glucose-Capillary: 206 mg/dL — ABNORMAL HIGH (ref 65–99)

## 2015-09-09 LAB — CREATININE, SERUM
Creatinine, Ser: 1.06 mg/dL (ref 0.61–1.24)
GFR calc Af Amer: >60 mL/min (ref >60)
GFR calc non Af Amer: >60 mL/min (ref >60)

## 2015-09-09 LAB — CBC
HCT: 40.2 % (ref 40.0–52.0)
Hemoglobin: 13.3 g/dL (ref 13.0–18.0)
MCH: 29.8 pg (ref 26.0–34.0)
MCHC: 33.1 g/dL (ref 32.0–36.0)
MCV: 90.2 fL (ref 80.0–100.0)
Platelets: 190 10*3/uL (ref 150–440)
RBC: 4.46 MIL/uL (ref 4.40–5.90)
RDW: 13.5 % (ref 11.5–14.5)
WBC: 5.4 10*3/uL (ref 3.8–10.6)

## 2015-09-09 MED ORDER — METHYLPHENIDATE HCL 5 MG PO TABS
5.0000 mg | ORAL_TABLET | ORAL | Status: DC
  Administered 2015-09-11 – 2015-09-18 (×6): 5 mg via ORAL
  Filled 2015-09-09 (×7): qty 1

## 2015-09-09 NOTE — Progress Notes (Signed)
[facility] MD Progress Note  09/09/2015 5:28 PM [place]  MRN:  [id number] Subjective: patient tells me today that he is actually probably feeling a little bit worse. Still not having suicidal thoughts but mood is bad. He is very passive and withdrawn. On the plus side his blood pressure is under good control. He continues to eat adequately.  Principal Problem: Bipolar disorder, now depressed [facility]) Diagnosis:   Patient Active Problem List   Diagnosis Date Noted  . Acute respiratory failure with hypoxia ([place]) [J96.01] 09/05/2015  . Elevated troponin [R79.89] 09/05/2015  . Somnolence [R40.0] 09/05/2015  . Bipolar depression ([place]) [F31.30] 09/05/2015  . Acute bilateral deep vein thrombosis (DVT) of femoral veins (HCC) [I82.413] 09/05/2015  . Bilateral pulmonary embolism ([place]) [I26.99] 09/02/2015  . Labile hypertension [I10]   . Pulmonary embolism with acute cor pulmonale ([place]) [I26.09]   . Dyspnea [R06.00]   . Orthostatic hypotension [I95.1]   . Bipolar disorder, in partial remission, most recent episode depressed ([place]) [F31.75]   . Syncope, near [R55] 08/31/2015  . Affective psychosis, bipolar ([place]) [F31.9]   . Bipolar disorder, now depressed ([place]) [F31.30] 08/28/2015  . Bipolar I disorder, severe, current or most recent episode depressed, with psychotic features ([place]) [F31.5]   . Pressure ulcer [L89.90] 07/30/2015  . Protein-calorie malnutrition, severe ([place]) [E43] 07/30/2015  . Dehydration [E86.0] 07/29/2015  . Bipolar disorder, current episode depressed, severe, with psychotic features ([place]) [F31.5] 07/09/2015  . Diabetes ([place]) [E11.9] 07/09/2015  . UTI (urinary tract infection) [N39.0] 06/19/2014  . Positive blood culture [R78.81] 06/19/2014  . Bipolar affective disorder, current episode manic with psychotic symptoms ([place]) [F31.2] 06/17/2014  . Type II or unspecified type diabetes mellitus with unspecified complication, uncontrolled [E11.8, E11.65] 06/02/2014  . Other and  unspecified hyperlipidemia [E78.5] 06/02/2014  . OSA on CPAP [G47.33] 06/02/2014  . 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 Gout [M10.9] 06/02/2014  . Hypertension [I10]    Total Time spent with patient: 30 minutes  Past Psychiatric History: Long-standing history of bipolar disorder currently a long severe depression with psychotic features responding to ECT. Patient has had difficulty responding which has been getting worse with time as he has become more chronically impaired. Multiple suicide attempts in the past. Multiple hospitalizations.  Past Medical History:  Past Medical History  Diagnosis Date  . Mood swings ([place])   . Type II or unspecified type diabetes mellitus with unspecified complication, uncontrolled 06/02/2014  . Other and unspecified hyperlipidemia 06/02/2014  . OSA on CPAP 06/02/2014  . 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 Bipolar disorder ([place])   . Hypertension   . Gout   . Bipolar 1 disorder ([place])    History reviewed. No pertinent past surgical history. Family History:  Family History  Problem Relation Age of Onset  . Dementia Mother   . Arthritis Father   . Heart failure Neg Hx   . Kidney failure Neg Hx   . Cancer Neg Hx    Family Psychiatric  History: Positive family history of depression and possibly bipolar disorder Social History:  History  Alcohol Use No     History  Drug Use No    Social History   Social History  . Marital Status: Married    Spouse Name: N/A  . Number of Children: N/A  . Years of Education: N/A   Social History Main Topics  . Smoking status: Never Smoker   . Smokeless  tobacco: None  . Alcohol Use: No  . Drug Use: No  . Sexual Activity: Not Currently   Other Topics Concern  . None   Social History Narrative   *The patient has been married for over 30 years and is currently on disability. He lives with his wife in the [place] area  and has one daughter.             Additional Social History:                         Sleep: Good  Appetite:  Good  Current Medications: Current Facility-Administered Medications  Medication Dose Route Frequency Provider Last Rate Last Dose  . acetaminophen (TYLENOL) tablet 650 mg  650 mg Oral Q6H PRN Gonzella Lex, MD   650 mg at 09/08/15 0900  . allopurinol (ZYLOPRIM) tablet 300 mg  300 mg Oral Daily Gonzella Lex, MD   300 mg at 09/09/15 0929  . alum & mag hydroxide-simeth (MAALOX/MYLANTA) 200-200-20 MG/5ML suspension 30 mL  30 mL Oral Q4H PRN Gonzella Lex, MD      . apixaban (ELIQUIS) tablet 5 mg  5 mg Oral BID Gonzella Lex, MD   5 mg at 09/09/15 0929  . aspirin EC tablet 81 mg  81 mg Oral Daily Gonzella Lex, MD   81 mg at 09/09/15 0929  . atorvastatin (LIPITOR) tablet 10 mg  10 mg Oral q1800 Gonzella Lex, MD   10 mg at 09/09/15 1708  . clonazePAM (KLONOPIN) tablet 1 mg  1 mg Oral QHS Gonzella Lex, MD   1 mg at 09/08/15 2119  . finasteride (PROSCAR) tablet 5 mg  5 mg Oral Daily Gonzella Lex, MD   5 mg at 09/09/15 0929  . insulin aspart (novoLOG) injection 0-15 Units  0-15 Units Subcutaneous TID WC Chauncey Mann, MD   3 Units at 09/09/15 1708  . insulin aspart (novoLOG) injection 0-5 Units  0-5 Units Subcutaneous QHS Chauncey Mann, MD   2 Units at 09/07/15 2130  . insulin aspart (novoLOG) injection 4 Units  4 Units Subcutaneous TID WC Chauncey Mann, MD   4 Units at 09/09/15 1709  . insulin glargine (LANTUS) injection 15 Units  15 Units Subcutaneous QHS Chauncey Mann, MD   15 Units at 09/08/15 2119  . lisinopril (PRINIVIL,ZESTRIL) tablet 10 mg  10 mg Oral Daily Gonzella Lex, MD   10 mg at 09/09/15 0929  . magnesium hydroxide (MILK OF MAGNESIA) suspension 30 mL  30 mL Oral Daily PRN Gonzella Lex, MD      . midodrine (PROAMATINE) tablet 5 mg  5 mg Oral TID WC Gonzella Lex, MD   5 mg at 09/09/15 1705  . mirtazapine (REMERON) tablet 45 mg  45 mg Oral QHS Gonzella Lex, MD   45 mg at 09/08/15 2119  . OLANZapine (ZYPREXA) tablet 30 mg  30 mg Oral QHS Gonzella Lex, MD   30 mg at 09/08/15 2118  . polyethylene glycol (MIRALAX / GLYCOLAX) packet 17 g  17 g Oral Daily Gonzella Lex, MD   17 g at 09/09/15 0929  . tamsulosin (FLOMAX) capsule 0.4 mg  0.4 mg Oral QPC supper Gonzella Lex, MD   0.4 mg at 09/08/15 1716  . traZODone (DESYREL) tablet 25 mg  25 mg Oral QHS Gonzella Lex, MD   25 mg at 09/08/15 2117  Lab Results:  Results for orders placed or performed during the hospital encounter of 09/05/15 (from the past 48 hour(s))  Glucose, capillary     Status: Abnormal   Collection Time: 09/07/15  8:19 PM  Result Value Ref Range   Glucose-Capillary 228 (H) 65 - 99 mg/dL  Glucose, capillary     Status: Abnormal   Collection Time: 09/08/15  6:45 AM  Result Value Ref Range   Glucose-Capillary 131 (H) 65 - 99 mg/dL  Glucose, capillary     Status: Abnormal   Collection Time: 09/08/15 11:47 AM  Result Value Ref Range   Glucose-Capillary 166 (H) 65 - 99 mg/dL   Comment 1 Notify RN   Glucose, capillary     Status: Abnormal   Collection Time: 09/08/15  5:11 PM  Result Value Ref Range   Glucose-Capillary 219 (H) 65 - 99 mg/dL  Glucose, capillary     Status: Abnormal   Collection Time: 09/08/15  8:33 PM  Result Value Ref Range   Glucose-Capillary 158 (H) 65 - 99 mg/dL   Comment 1 Notify RN   CBC     Status: None   Collection Time: 09/09/15  6:34 AM  Result Value Ref Range   WBC 5.4 3.8 - 10.6 K/uL   RBC 4.46 4.40 - 5.90 MIL/uL   Hemoglobin 13.3 13.0 - 18.0 g/dL   HCT 40.2 40.0 - 52.0 %   MCV 90.2 80.0 - 100.0 fL   MCH 29.8 26.0 - 34.0 pg   MCHC 33.1 32.0 - 36.0 g/dL   RDW 13.5 11.5 - 14.5 %   Platelets 190 150 - 440 K/uL  Creatinine, serum     Status: None   Collection Time: 09/09/15  6:34 AM  Result Value Ref Range   Creatinine, Ser 1.06 0.61 - 1.24 mg/dL   GFR calc non Af Amer >60 >60 mL/min   GFR calc Af Amer >60 >60 mL/min     Comment: (NOTE) The eGFR has been calculated using the CKD EPI equation. This calculation has not been validated in all clinical situations. eGFR's persistently <60 mL/min signify possible Chronic Kidney Disease.   Glucose, capillary     Status: Abnormal   Collection Time: 09/09/15  6:54 AM  Result Value Ref Range   Glucose-Capillary 132 (H) 65 - 99 mg/dL  Glucose, capillary     Status: Abnormal   Collection Time: 09/09/15 12:22 PM  Result Value Ref Range   Glucose-Capillary 154 (H) 65 - 99 mg/dL  Glucose, capillary     Status: Abnormal   Collection Time: 09/09/15  4:34 PM  Result Value Ref Range   Glucose-Capillary 167 (H) 65 - 99 mg/dL    Physical Findings: AIMS: Facial and Oral Movements Muscles of Facial Expression: None, normal Lips and Perioral Area: None, normal Jaw: None, normal Tongue: None, normal,Extremity Movements Upper (arms, wrists, hands, fingers): None, normal Lower (legs, knees, ankles, toes): None, normal, Trunk Movements Neck, shoulders, hips: None, normal, Overall Severity Severity of abnormal movements (highest score from questions above): None, normal Incapacitation due to abnormal movements: None, normal Patient's awareness of abnormal movements (rate only patient's report): No Awareness, Dental Status Current problems with teeth and/or dentures?: No Does patient usually wear dentures?: No  CIWA:    COWS:     Musculoskeletal: Strength & Muscle Tone: decreased Gait & Station: normal Patient leans: N/A  Psychiatric Specialty Exam: Review of Systems  Constitutional: Positive for malaise/fatigue.  HENT: Negative.   Eyes: Negative.   Respiratory:  Negative.   Cardiovascular: Negative.   Gastrointestinal: Negative.   Musculoskeletal: Positive for joint pain.  Skin: Negative.   Neurological: Positive for weakness.  Psychiatric/Behavioral: Positive for memory loss. Negative for depression, suicidal ideas, hallucinations and substance abuse. The  patient is not nervous/anxious and does not have insomnia.     Blood pressure 109/80, pulse 133, temperature 98.5 F (36.9 C), temperature source Oral, resp. rate 20, height 6' (1.829 m), weight 97.523 kg (215 lb), SpO2 99 %.Body mass index is 29.15 kg/(m^2).  General Appearance: Casual  Eye Contact::  Fair  Speech:  Slow  Volume:  Decreased  Mood:  Dysphoric  Affect:  Depressed  Thought Process:  Tangential  Orientation:  Full (Time, Place, and Person)  Thought Content:  Negative  Suicidal Thoughts:  No  Homicidal Thoughts:  No  Memory:  Immediate;   Good Recent;   Fair Remote;   Fair  Judgement:  Impaired  Insight:  Shallow  Psychomotor Activity:  Decreased  Concentration:  Poor  Recall:  [employer] of Knowledge:Good  Language: Fair  Akathisia:  No  Handed:  Right  AIMS (if indicated):     Assets:  Desire for Improvement Financial Resources/Insurance Housing Intimacy Leisure Time Resilience Social Support  ADL's:  Intact  Cognition: Impaired,  Mild  Sleep:  Number of Hours: 7.15   Treatment Plan Summary: Daily contact with patient to assess and evaluate symptoms and progress in treatment, Medication management and Plan Patient is going to be treated with ECT again tomorrow as maintenance. Case was discussed in length of stay meeting today. Social work will work with the patient and his wife about finding a safe discharge plan. Had a long talk with his outpatient psychiatrist today about prior treatment. Based on her recommendation or at least past experience I'm going to start him on a low dose of Ritalin to help with mood and energy in the morning. We are hoping for discharge by later this week. Additionally, I note that his blood pressure is under better control. Not having the low dips in blood pressure. Sugars are looking under better control as well. Patient continues on medicine for his recent pulmonary embolism. Merilynn Haydu 09/09/2015, 5:28 PM

## 2015-09-09 NOTE — Plan of Care (Signed)
Problem: [facility] Participation in Recreation Therapeutic Interventions Goal: STG-Patient will demonstrate improved self esteem by identif STG: Self-Esteem - Within 5 treatment sessions, patient will verbalize at least 5 positive affirmation statements in each of 3 treatment sessions to increase self-esteem post d/c.  Outcome: Progressing Treatment Session 1; Completed 1 out of 3: At approximately 10:50 am, LRT met with patient in patient room. Patient verbalized 5 positive affirmation statements. Patient reported it felt "empowering". LRT encouraged patient to continue saying the positive affirmation statements.  Leonette Monarch, LRT/CTRS 10.11.16 1:39 pm Goal: STG-Other Recreation Therapy Goal (Specify) STG: Stress Management - Within 5 treatment sessions, patient will verbalize understanding of the stress management techniques in each of 2 treatment sessions to increase stress management skills post d/c.  Outcome: Progressing Treatment Session 1; Completed 1 out of 2: At approximately 10:50 am, LRT met with patient in patient room. LRT educated and provided patient with handouts on the stress management techniques. Patient verbalized understanding. LRT encouraged patient to read over and practice the stress management techniques.  Leonette Monarch, LRT/CTRS 10.11.16 1:42 pm

## 2015-09-09 NOTE — Evaluation (Signed)
Physical Therapy Evaluation Patient Details Name: Christian [name] MRN: [id number] DOB: 11-13-1951 Today's Date: 09/09/2015   History of Present Illness  64 year old man with bipolar disorder depressed. Extended hospitalization for ECT treatment of severe psychotic depression. Had been transferred last weekend to the internal medicine service for evaluation of cardiac arrhythmia. Diagnosed eventually with pulmonary embolism. Now stable and transferred back to psychiatric service. Patient states he is not feeling good but has a hard time defining it. Denies any thoughts about suicide. Denies hallucinations.  Clinical Impression  Pt is much more awake, alert, and responsive compared to initial evaluation performed on 9/12. Pt known to therapist from previous hospitalization.  Pt complains of acute R ankle pain and swelling that is TTP. Pt was able to display independence with bed mobility and min assist with sit<>stand transfers from low surface. Activity during this evaluation was limited by pt's resting vitals and orthostatic hypotension. Pt's resting HR was 133 and resting BP was 109/90. Upon standing, pt's HR increased to 146 and his BP dropped to 84/52 and was accompanied by brief dizziness. Pt's nurse was made aware of our findings. Pt will continue to benefit from skilled acute PT services in order to increase his functional mobility and strength.     Follow Up Recommendations Home health PT    Equipment Recommendations  Rolling walker with 5" wheels    Recommendations for Other Services       Precautions / Restrictions Precautions Precautions: Fall Restrictions Weight Bearing Restrictions: No      Mobility  Bed Mobility Overal bed mobility: Independent Bed Mobility: Supine to Sit     Supine to sit: Independent        Transfers Overall transfer level: Needs assistance Equipment used: Rolling walker (2 wheeled) Transfers: Sit to/from Stand (from low surface) Sit to  Stand: Min assist         General transfer comment: pt performed 2 sit<>stand transfers with min assist from very low surface. Would likely be able to transfer independently if the surface he was sitting on was a normal height.  Ambulation/Gait Ambulation/Gait assistance:  (activity limited to basic transfers due to increased resting HR and positive orthostasis)              Stairs            Wheelchair Mobility    Modified Rankin (Stroke Patients Only)       Balance   Sitting-balance support: No upper extremity supported;Feet supported Sitting balance-Leahy Scale: Good     Standing balance support: Bilateral upper extremity supported;During functional activity Standing balance-Leahy Scale: Good                               Pertinent Vitals/Pain Pain Assessment:  (pt reports pain in R ankle with associated swelling)    Home Living Family/patient expects to be discharged to:: Private residence Living Arrangements: Spouse/significant other Available Help at Discharge: Family                  Prior Function                 Hand Dominance        Extremity/Trunk Assessment   Upper Extremity Assessment: Overall WFL for tasks assessed           Lower Extremity Assessment: Overall WFL for tasks assessed         Communication   Communication: No  difficulties  Cognition Arousal/Alertness: Awake/alert Behavior During Therapy: WFL for tasks assessed/performed Overall Cognitive Status: Within Functional Limits for tasks assessed                      General Comments      Exercises Other Exercises Other Exercises: sit<>stand x 2 from low surface and min assist      Assessment/Plan    PT Assessment Patient needs continued PT services  PT Diagnosis Acute pain;Generalized weakness   PT Problem List Decreased strength;Decreased mobility;Decreased balance;Decreased activity tolerance;Pain  PT Treatment  Interventions DME instruction;Gait training;Functional mobility training;Therapeutic activities;Therapeutic exercise;Balance training;Patient/family education   PT Goals (Current goals can be found in the Care Plan section) Acute Rehab PT Goals Patient Stated Goal: to go home PT Goal Formulation: With patient Time For Goal Achievement: 07/24/15 Potential to Achieve Goals: Fair    Frequency Min 2X/week   Barriers to discharge        Co-evaluation               End of Session Equipment Utilized During Treatment: Gait belt Activity Tolerance: Patient tolerated treatment well Patient left: in bed Nurse Communication: Mobility status (orthostatic status )         Time: 9371-6967 PT Time Calculation (min) (ACUTE ONLY): 18 min   Charges:         PT G Codes:        Desirre Eickhoff,SPT 09/09/2015, 1:05 PM

## 2015-09-09 NOTE — Progress Notes (Signed)
Calm and cooperative. amb via w/c, self propel. Med compliant. Pt rates his depression level as 5 (0low- 10 worst). No voiced thoughts of hurting himself. No AV/H noted. No behavior problems noted. No c/o pain/discomfort noted.

## 2015-09-09 NOTE — Plan of Care (Signed)
Problem: Consults Goal: Depression Patient Education See Patient Education Module for education specifics.  Outcome: Not Met (add Reason) Pt remains sad and depressed. Flat affect. Med compliant. No injuries noted. q 15 min checks maintained for safety

## 2015-09-09 NOTE — Tx Team (Signed)
Interdisciplinary Treatment Plan Update (Adult)  Date:  09/09/2015 Time Reviewed:  5:36 PM  Progress in Treatment: Attending groups: No. Participating in groups:  No. Taking medication as prescribed:  Yes. Tolerating medication:  Yes. Family/Significant othe contact made:  No, will contact:    Patient understands diagnosis:  Yes. Discussing patient identified problems/goals with staff:  Yes. Medical problems stabilized or resolved:  Yes. Denies suicidal/homicidal ideation: yes Issues/concerns per patient self-inventory:  Yes. Other:  New problem(s) identified: No, Describe:     Discharge Plan or Barriers: Wife doesn't want him to come directly home as she is afraid she cannot care for him. ALF placement may be most appropriate.  Pt has PASRR already but will have to pay out of pocket for ALF care.  Reason for Continuation of Hospitalization: Depression Medication stabilization  Comments:64 year old man with bipolar disorder depressed. Extended hospitalization for ECT treatment of severe psychotic depression. Had been transferred last weekend to the internal medicine service for evaluation of cardiac arrhythmia. Diagnosed eventually with pulmonary embolism. Now stable and transferred back to psychiatric service. Patient states he is not feeling good but has a hard time defining it. Denies any thoughts about suicide. Denies hallucinations  Estimated length of stay: up to 3 days  New goal(s):  Review of initial/current patient goals per problem list:   See plan of care  Attendees: Patient:  Christian [name] 10/11/20165:36 PM  Family:   10/11/20165:36 PM  Physician:  Annett Gula 10/11/20165:36 PM  Nursing:    10/11/20165:36 PM  Case Manager:   10/11/20165:36 PM  Counselor:  Jake Shark, LCSW 10/11/20165:36 PM  Other:  Beryl Meager, LCSWA 10/11/20165:36 PM  Other:   10/11/20165:36 PM  Other:   10/11/20165:36 PM  Other:  10/11/20165:36 PM  Other:  10/11/20165:36 PM  Other:   10/11/20165:36 PM  Other:  10/11/20165:36 PM  Other:  10/11/20165:36 PM  Other:  10/11/20165:36 PM  Other:   10/11/20165:36 PM   Scribe for Treatment Team:   Glennon Mac, 09/09/2015, 5:36 PM, MSW, LCSW

## 2015-09-09 NOTE — Progress Notes (Signed)
D: Patient has been in his room all day except to come out for meals. He is using wheelchair for mobility. PT worked with him briefly today, but he had orthostatic BP drops and was not able to ambulate. He seems to struggle just to sit up in bed. Has been asked to call for assistance when transferring. A: On 15 minute checks. Given meds. R: Calm and cooperative but isolative.

## 2015-09-09 NOTE — Plan of Care (Signed)
Problem: Alteration in mood Goal: STG-Patient is able to discuss feelings and issues (Patient is able to discuss feelings and issues leading to depression)  Outcome: Not Progressing Patient emotionally guarded. Not attending groups.

## 2015-09-09 NOTE — Progress Notes (Signed)
Recreation Therapy Notes  Date: 10.11.16 Time: 3:00 pm Location: Craft Room  Group Topic: Goal Setting  Goal Area(s) Addresses:  Patient will write at least one goal. Patient will write at least one obstacle.  Behavioral Response: Did not attend  Intervention: Recovery Goal Chart  Activity: Patients were instructed to make a goal chart including goals, obstacles, the date they started working on their goal, and the date they achieved their goal.  Education: LRT educated patients on healthy way to celebrate reaching their goals.  Education Outcome: Patient did not attend group.   Clinical Observations/Feedback: Patient did not attend group.  Jacquelynn Cree, LRT/CTRS 09/09/2015 4:21 PM

## 2015-09-09 NOTE — [facility] Group Notes (Signed)
[facility] LCSW Group Therapy  09/09/2015 2:55 PM  Type of Therapy:  Group Therapy  Participation Level:  Did Not Attend  Lulu Riding, MSW, LCSWA 09/09/2015, 2:55 PM

## 2015-09-09 NOTE — Plan of Care (Signed)
Problem: Alteration in mood Goal: STG-Patient is able to discuss feelings and issues (Patient is able to discuss feelings and issues leading to depression)  Outcome: Not Met (add Reason) Calm and cooperative. Flat affect. Rates his depression level 5. No thoughts of hurting herself. q 15 min checks maintained for safety.

## 2015-09-09 NOTE — [facility] Group Notes (Signed)
[facility] Group Notes:  (Nursing/MHT/Case Management/Adjunct)  Date:  09/09/2015  Time:  1:49 PM  Type of Therapy:  Psychoeducational Skills  Participation Level:  Did Not Attend  Christian [name] 09/09/2015, 1:49 PM

## 2015-09-10 ENCOUNTER — Inpatient Hospital Stay: Payer: Commercial Managed Care - HMO

## 2015-09-10 ENCOUNTER — Inpatient Hospital Stay: Payer: Commercial Managed Care - HMO | Admitting: Anesthesiology

## 2015-09-10 LAB — GLUCOSE, CAPILLARY
Glucose-Capillary: 98 mg/dL (ref 65–99)
Glucose-Capillary: 309 mg/dL — ABNORMAL HIGH (ref 65–99)
Glucose-Capillary: 263 mg/dL — ABNORMAL HIGH (ref 65–99)

## 2015-09-10 MED ORDER — LABETALOL HCL 5 MG/ML IV SOLN
20.0000 mg | Freq: Once | INTRAVENOUS | Status: AC
  Administered 2015-09-10: 20 mg via INTRAVENOUS
  Filled 2015-09-10: qty 4

## 2015-09-10 MED ORDER — LABETALOL HCL 5 MG/ML IV SOLN
20.0000 mg | Freq: Once | INTRAVENOUS | Status: AC
  Administered 2015-09-10: 20 mg via INTRAVENOUS

## 2015-09-10 MED ORDER — HYDRALAZINE HCL 20 MG/ML IJ SOLN
10.0000 mg | Freq: Once | INTRAMUSCULAR | Status: AC
  Administered 2015-09-10: 14:00:00 via INTRAVENOUS

## 2015-09-10 MED ORDER — SUCCINYLCHOLINE CHLORIDE 20 MG/ML IJ SOLN
140.0000 mg | Freq: Once | INTRAMUSCULAR | Status: AC
  Administered 2015-09-10: 120 mg via INTRAVENOUS

## 2015-09-10 MED ORDER — LABETALOL HCL 5 MG/ML IV SOLN: 20.0000 mg | Freq: Once | INTRAVENOUS | Status: DC

## 2015-09-10 MED ORDER — SODIUM CHLORIDE 0.9 % IV SOLN
250.0000 mL | Freq: Once | INTRAVENOUS | Status: AC
  Administered 2015-09-10 – 2015-09-12 (×2): 250 mL via INTRAVENOUS

## 2015-09-10 MED ORDER — KETAMINE HCL 10 MG/ML IJ SOLN: 120.0000 mg | Freq: Once | INTRAMUSCULAR | Status: DC

## 2015-09-10 MED ORDER — AMMONIUM LACTATE 12 % EX LOTN
TOPICAL_LOTION | CUTANEOUS | Status: DC | PRN
  Filled 2015-09-10: qty 400

## 2015-09-10 MED ORDER — KETAMINE HCL 10 MG/ML IJ SOLN
120.0000 mg | Freq: Once | INTRAMUSCULAR | Status: AC
  Administered 2015-09-10: 120 mg via INTRAVENOUS

## 2015-09-10 MED ORDER — LISINOPRIL 5 MG PO TABS
5.0000 mg | ORAL_TABLET | Freq: Every day | ORAL | Status: DC
  Administered 2015-09-11 – 2015-09-18 (×8): 5 mg via ORAL
  Filled 2015-09-10 (×9): qty 1

## 2015-09-10 MED ORDER — KETOROLAC TROMETHAMINE 30 MG/ML IJ SOLN
30.0000 mg | Freq: Once | INTRAMUSCULAR | Status: AC
Start: 2015-09-10 — End: 2015-09-15

## 2015-09-10 MED ORDER — LIDOCAINE HCL (CARDIAC) 20 MG/ML IV SOLN
4.0000 mg | Freq: Once | INTRAVENOUS | Status: AC
  Administered 2015-09-10: 4 mg via INTRAVENOUS

## 2015-09-10 MED ORDER — SUCCINYLCHOLINE CHLORIDE 20 MG/ML IJ SOLN: 120.0000 mg | Freq: Once | INTRAMUSCULAR | Status: DC

## 2015-09-10 NOTE — Progress Notes (Signed)
PT Cancellation Note  Patient Details Name: HASHIM SERMERSHEIM MRN: [id number] DOB: 10-Aug-1951   Cancelled Treatment:    Reason Eval/Treat Not Completed: Patient at procedure or test/unavailable. Will re-attempt at a later time/date.    Georgina Peer 09/10/2015, 12:20 PM

## 2015-09-10 NOTE — Anesthesia Preprocedure Evaluation (Addendum)
Anesthesia Evaluation  Patient identified by MRN, date of birth, ID band Patient awake    Reviewed: Allergy & Precautions, H&P , NPO status , Patient's Chart, lab work & pertinent test results, reviewed documented beta blocker date and time   Airway Mallampati: III  TM Distance: >3 FB Neck ROM: full    Dental no notable dental hx. (+) Edentulous Upper   Pulmonary neg pulmonary ROS, shortness of breath and with exertion, sleep apnea ,    Pulmonary exam normal breath sounds clear to auscultation       Cardiovascular Exercise Tolerance: Good hypertension, + Peripheral Vascular Disease  negative cardio ROS Normal cardiovascular exam Rhythm:regular Rate:Normal     Neuro/Psych PSYCHIATRIC DISORDERS negative neurological ROS  negative psych ROS   GI/Hepatic negative GI ROS, Neg liver ROS,   Endo/Other  negative endocrine ROSdiabetes  Renal/GU negative Renal ROS  negative genitourinary   Musculoskeletal   Abdominal   Peds  Hematology negative hematology ROS (+)   Anesthesia Other Findings   Reproductive/Obstetrics negative OB ROS                             Anesthesia Physical Anesthesia Plan  ASA: III  Anesthesia Plan: General   Post-op Pain Management:    Induction:   Airway Management Planned:   Additional Equipment:   Intra-op Plan:   Post-operative Plan:   Informed Consent: I have reviewed the patients History and Physical, chart, labs and discussed the procedure including the risks, benefits and alternatives for the proposed anesthesia with the patient or authorized representative who has indicated his/her understanding and acceptance.   Dental Advisory Given  Plan Discussed with: CRNA  Anesthesia Plan Comments:         Anesthesia Quick Evaluation

## 2015-09-10 NOTE — Transfer of Care (Signed)
Immediate Anesthesia Transfer of Care Note  Patient: Christian [name]  Procedure(s) Performed: ECT  Patient Location: PACU  Anesthesia Type:General  Level of Consciousness: sedated  Airway & Oxygen Therapy: Patient Spontanous Breathing and Patient connected to face mask oxygen  Post-op Assessment: Report given to RN and Post -op Vital signs reviewed and stable  Post vital signs: Reviewed and stable  Last Vitals:  Filed Vitals:   09/10/15 1330  BP: 163/126  Pulse: 101  Temp: 98.6 F  Resp: 20   Dr Pernell Dupre aware of hypertension; PACU RN to give 10 mg hydralazine IV per  Dr. Pernell Dupre  Complications: No apparent anesthesia complications

## 2015-09-10 NOTE — Consult Note (Signed)
WOC wound consult note Reason for Consult:Resolving Deep Tissue Injury to right lateral foot, near heel.   Wound type:Resolving Deep Tissue Injury with new epithelialization Pressure Ulcer POA: Yes Measurement: 4 cm x 6.2 cm intact (after crusted, devitalized skin removed) Wound bed:Intact.  DTI that has healed with new, pink epithelialization present  Intact.  No drainage.  Drainage (amount, consistency, odor) None  resolved Periwound:Dried skin to bilateral legs and feet .  Will order Moisturizing lotion for this daily.  Dressing procedure/placement/frequency:Cleanse legs and feet with soap and water and pat gently dry.  Apply Amlactin lotion to bilateral legs and feet, including heels daily.  Conservative sharp wound debridement (CSWD performed at the bedside): removed crusted, devitalized tissue.  Revealed intact epithelial tissue.  Will not follow at this time.  Please re-consult if needed.  Maple Hudson RN BSN [facility] Pager [phone]

## 2015-09-10 NOTE — H&P (Signed)
Christian [name] is an 64 y.o. male.   Chief Complaint: Patient is continuing to get ECT with this being a maintenance treatment. Since his last visit here he had to go to the medical unit for a pulmonary embolism but that has been dealt with. He has no other new complaints now. HPI: Mood has been feeling more depressed and down and he is even more withdrawn the last few days  Past Medical History  Diagnosis Date  . Mood swings ([place])   . Type II or unspecified type diabetes mellitus with unspecified complication, uncontrolled 06/02/2014  . Other and unspecified hyperlipidemia 06/02/2014  . OSA on CPAP 06/02/2014  . 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 Bipolar disorder ([place])   . Hypertension   . Gout   . Bipolar 1 disorder ([place])     History reviewed. No pertinent past surgical history.  Family History  Problem Relation Age of Onset  . Dementia Mother   . Arthritis Father   . Heart failure Neg Hx   . Kidney failure Neg Hx   . Cancer Neg Hx    Social History:  reports that he has never smoked. He does not have any smokeless tobacco history on file. He reports that he does not drink alcohol or use illicit drugs.  Allergies: No Known Allergies  Medications Prior to Admission  Medication Sig Dispense Refill  . allopurinol (ZYLOPRIM) 300 MG tablet Take 1 tablet (300 mg total) by mouth daily. For gout    . apixaban (ELIQUIS) 5 MG TABS tablet Take 2 tablets (10 mg total) by mouth 2 (two) times daily. 28 tablet 0  . apixaban (ELIQUIS) 5 MG TABS tablet Take 1 tablet (5 mg total) by mouth 2 (two) times daily. 60 tablet 6  . aspirin EC 81 MG tablet Take 81 mg by mouth daily.    Marland Kitchen atorvastatin (LIPITOR) 10 MG tablet Take 1 tablet (10 mg total) by mouth daily. For high cholesterol    . cholecalciferol (VITAMIN D) 400 UNITS TABS tablet Take 800 Units by mouth daily.    . clonazePAM (KLONOPIN) 1 MG tablet Take 1 tablet (1 mg total) by mouth at bedtime. 30  tablet 0  . docusate sodium (COLACE) 100 MG capsule Take 2 capsules (200 mg total) by mouth 2 (two) times daily. 10 capsule 0  . feeding supplement, GLUCERNA SHAKE, (GLUCERNA SHAKE) LIQD Take 237 mLs by mouth 3 (three) times daily with meals.  0  . finasteride (PROSCAR) 5 MG tablet Take 1 tablet (5 mg total) by mouth daily. 1 tablet 0  . insulin aspart (NOVOLOG) 100 UNIT/ML injection Inject 0-9 Units into the skin 3 (three) times daily with meals. 10 mL 11  . insulin aspart (NOVOLOG) 100 UNIT/ML injection Inject 5 Units into the skin 3 (three) times daily with meals. 10 mL 11  . insulin aspart (NOVOLOG) 100 UNIT/ML injection Inject 0-9 Units into the skin 3 (three) times daily with meals. 10 mL 11  . insulin glargine (LANTUS) 100 UNIT/ML injection Inject 0.12 mLs (12 Units total) into the skin at bedtime. 10 mL 11  . levofloxacin (LEVAQUIN) 750 MG tablet Take 1 tablet (750 mg total) by mouth daily. Please continue for 3 more days to complete course 3 tablet 0  . midodrine (PROAMATINE) 5 MG tablet Take 1 tablet (5 mg total) by mouth 3 (three) times daily with meals. 90 tablet 6  . mirtazapine (REMERON) 30 MG tablet Take 30 mg  by mouth at bedtime.    Marland Kitchen OLANZapine zydis (ZYPREXA) 15 MG disintegrating tablet Take 2 tablets (30 mg total) by mouth at bedtime. 1 tablet 0  . ondansetron (ZOFRAN) 4 MG tablet Take 1 tablet (4 mg total) by mouth every 6 (six) hours as needed for nausea. 20 tablet 0  . polyethylene glycol (MIRALAX / GLYCOLAX) packet Take 17 g by mouth daily. 14 each 0  . tamsulosin (FLOMAX) 0.4 MG CAPS capsule Take 1 capsule (0.4 mg total) by mouth at bedtime. 30 capsule 9  . traZODone (DESYREL) 50 MG tablet Take 0.5 tablets (25 mg total) by mouth 3 (three) times daily. (Patient taking differently: Take 200 mg by mouth at bedtime. )      Results for orders placed or performed during the hospital encounter of 09/05/15 (from the past 48 hour(s))  Glucose, capillary     Status: Abnormal    Collection Time: 09/08/15  5:11 PM  Result Value Ref Range   Glucose-Capillary 219 (H) 65 - 99 mg/dL  Glucose, capillary     Status: Abnormal   Collection Time: 09/08/15  8:33 PM  Result Value Ref Range   Glucose-Capillary 158 (H) 65 - 99 mg/dL   Comment 1 Notify RN   CBC     Status: None   Collection Time: 09/09/15  6:34 AM  Result Value Ref Range   WBC 5.4 3.8 - 10.6 K/uL   RBC 4.46 4.40 - 5.90 MIL/uL   Hemoglobin 13.3 13.0 - 18.0 g/dL   HCT 40.2 40.0 - 52.0 %   MCV 90.2 80.0 - 100.0 fL   MCH 29.8 26.0 - 34.0 pg   MCHC 33.1 32.0 - 36.0 g/dL   RDW 13.5 11.5 - 14.5 %   Platelets 190 150 - 440 K/uL  Creatinine, serum     Status: None   Collection Time: 09/09/15  6:34 AM  Result Value Ref Range   Creatinine, Ser 1.06 0.61 - 1.24 mg/dL   GFR calc non Af Amer >60 >60 mL/min   GFR calc Af Amer >60 >60 mL/min    Comment: (NOTE) The eGFR has been calculated using the CKD EPI equation. This calculation has not been validated in all clinical situations. eGFR's persistently <60 mL/min signify possible Chronic Kidney Disease.   Glucose, capillary     Status: Abnormal   Collection Time: 09/09/15  6:54 AM  Result Value Ref Range   Glucose-Capillary 132 (H) 65 - 99 mg/dL  Glucose, capillary     Status: Abnormal   Collection Time: 09/09/15 12:22 PM  Result Value Ref Range   Glucose-Capillary 154 (H) 65 - 99 mg/dL  Glucose, capillary     Status: Abnormal   Collection Time: 09/09/15  4:34 PM  Result Value Ref Range   Glucose-Capillary 167 (H) 65 - 99 mg/dL  Glucose, capillary     Status: Abnormal   Collection Time: 09/09/15  8:10 PM  Result Value Ref Range   Glucose-Capillary 206 (H) 65 - 99 mg/dL  Glucose, capillary     Status: None   Collection Time: 09/10/15  6:34 AM  Result Value Ref Range   Glucose-Capillary 98 65 - 99 mg/dL   Comment 1 Notify RN    Comment 2 Document in Chart    No results found.  Review of Systems  Constitutional: Positive for malaise/fatigue.  HENT:  Negative.   Eyes: Negative.   Respiratory: Negative.   Cardiovascular: Negative.   Gastrointestinal: Negative.   Musculoskeletal: Positive for joint  pain.  Skin: Negative.   Neurological: Positive for weakness.  Psychiatric/Behavioral: Positive for depression and memory loss. Negative for suicidal ideas, hallucinations and substance abuse. The patient has insomnia. The patient is not nervous/anxious.     Blood pressure 108/84, pulse 120, temperature 97.4 F (36.3 C), temperature source Tympanic, resp. rate 20, height 6' (1.829 m), weight 96.163 kg (212 lb), SpO2 100 %. Physical Exam  Nursing note and vitals reviewed. Constitutional: He appears well-developed and well-nourished.  HENT:  Head: Normocephalic and atraumatic.  Eyes: Conjunctivae are normal. Pupils are equal, round, and reactive to light.  Neck: Normal range of motion.  Cardiovascular: Normal rate, regular rhythm and normal heart sounds.   Respiratory: Effort normal and breath sounds normal. No respiratory distress. He has no wheezes. He has no rales.  GI: Soft.  Musculoskeletal: Normal range of motion.  Neurological: He is alert.  Skin: Skin is warm and dry.  Psychiatric: Judgment and thought content normal. His speech is delayed. He is withdrawn. He exhibits a depressed mood. He exhibits abnormal recent memory.     Assessment/Plan More withdrawn and depressed but does not appear to have psychotic symptoms yet.  Christian [name] 09/10/2015, 1:03 PM

## 2015-09-10 NOTE — Procedures (Signed)
[facility] Physician's Interval Evaluation & Treatment Note  Patient Identification: Christian [name] MRN:  [id number] Date of Evaluation:  09/10/2015 TX #: [id number]  MADRS:   MMSE:   P.E. Findings:  Patient has no specific new physical complaint except for some pain in his right ankle that seems to of arisen recently.  Psychiatric Interval Note:  Mood is starting to get back down to being more depressed but he is not reporting psychotic symptoms yet  Subjective:  Patient is a 64 y.o. male seen for evaluation for Electroconvulsive Therapy. Patient reports that he is feeling worse  Treatment Summary:   []   Right Unilateral             [x]  Bilateral   % Energy : 1.0 ms 100%   Impedance: 590 ohms  Seizure Energy Index: Not red  Postictal Suppression Index: Not red  Seizure Concordance Index: 92%  Medications  Pre Shock: Xylocaine 4 mg, labetalol 20 mg, ketamine 120 mg, succinylcholine 120 mg  Post Shock: Labetalol 20 mg, labetalol 20 mg (2 separate doses)  Seizure Duration: 19 seconds by EMG, 39 seconds by EEG   Comments: Patient will be reevaluated. I'm not sure if were going to do treatment on Friday. Next time we do treatment we will need to increase the dose of succinylcholine 250 mg   Lungs:  [x]   Clear to auscultation               []  Other:   Heart:    [x]   Regular rhythm             []  irregular rhythm    [x]   Previous H&P reviewed, patient examined and there are NO CHANGES                 []   Previous H&P reviewed, patient examined and there are changes noted.   [name], MD 10/12/20161:06 PM

## 2015-09-10 NOTE — Progress Notes (Signed)
Calm and cooperative. Flat affect. amb via w/c. C/o right foot pain and left shoulder pain. Pt visited by spouse who states pt is missing his medication for gout says he takes colchicine at home. Pt rates pain level 8/10. Pt rate his depression level 5(0low -10worst). No voiced thoughts of hurting himself. Dressing changed to left heel. Tylenol 650mg  given for pain as ordered PRN. Med compliant. No AV/H noted. No behavior problems noted. Will continue to monitor for safety and behavior.

## 2015-09-10 NOTE — [facility] Group Notes (Signed)
[facility] LCSW Group Therapy  09/10/2015 3:00 PM  Type of Therapy:  Group Therapy  Participation Level:  Did Not Attend  Modes of Intervention:  Discussion, Education, Socialization and Support  Summary of Progress/Problems: Emotional Regulation: Patients will identify both negative and positive emotions. They will discuss emotions they have difficulty regulating and how they impact their lives. Patients will be asked to identify healthy coping skills to combat unhealthy reactions to negative emotions.     Cletus Paris L Arlicia Paquette MSW, LCSWA  09/10/2015, 3:00 PM  

## 2015-09-10 NOTE — Progress Notes (Signed)
[facility] MD Progress Note  09/10/2015 7:46 PM Christian [name]  MRN:  [id number] [place] had a maintenance ECT treatment today which was tolerated well. Mood is improved. Does not appear to be psychotic and denies active suicidal thoughts. I got to speak with the patient and his wife at some length this evening about discharge planning. We are also concerned that he continues to have some orthostatic hypotension symptoms.  Principal Problem: Bipolar disorder, now depressed [facility]) Diagnosis:   Patient Active Problem List   Diagnosis Date Noted  . Acute respiratory failure with hypoxia ([place]) [J96.01] 09/05/2015  . Elevated troponin [R79.89] 09/05/2015  . Somnolence [R40.0] 09/05/2015  . Bipolar depression ([place]) [F31.30] 09/05/2015  . Acute bilateral deep vein thrombosis (DVT) of femoral veins (HCC) [I82.413] 09/05/2015  . Bilateral pulmonary embolism ([place]) [I26.99] 09/02/2015  . Labile hypertension [I10]   . Pulmonary embolism with acute cor pulmonale ([place]) [I26.09]   . Dyspnea [R06.00]   . Orthostatic hypotension [I95.1]   . Bipolar disorder, in partial remission, most recent episode depressed ([place]) [F31.75]   . Syncope, near [R55] 08/31/2015  . Affective psychosis, bipolar ([place]) [F31.9]   . Bipolar disorder, now depressed ([place]) [F31.30] 08/28/2015  . Bipolar I disorder, severe, current or most recent episode depressed, with psychotic features ([place]) [F31.5]   . Pressure ulcer [L89.90] 07/30/2015  . Protein-calorie malnutrition, severe ([place]) [E43] 07/30/2015  . Dehydration [E86.0] 07/29/2015  . Bipolar disorder, current episode depressed, severe, with psychotic features ([place]) [F31.5] 07/09/2015  . Diabetes ([place]) [E11.9] 07/09/2015  . UTI (urinary tract infection) [N39.0] 06/19/2014  . Positive blood culture [R78.81] 06/19/2014  . Bipolar affective disorder, current episode manic with psychotic symptoms ([place]) [F31.2] 06/17/2014  . Type II or unspecified type diabetes mellitus with  unspecified complication, uncontrolled [E11.8, E11.65] 06/02/2014  . Other and unspecified hyperlipidemia [E78.5] 06/02/2014  . OSA on CPAP [G47.33] 06/02/2014  . 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 Gout [M10.9] 06/02/2014  . Hypertension [I10]    Total Time spent with patient: 30 minutes  Past Psychiatric History: Long-standing history of bipolar disorder currently a long severe depression with psychotic features responding to ECT. Patient has had difficulty responding which has been getting worse with time as he has become more chronically impaired. Multiple suicide attempts in the past. Multiple hospitalizations.  Past Medical History:  Past Medical History  Diagnosis Date  . Mood swings ([place])   . Type II or unspecified type diabetes mellitus with unspecified complication, uncontrolled 06/02/2014  . Other and unspecified hyperlipidemia 06/02/2014  . OSA on CPAP 06/02/2014  . 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 Bipolar disorder ([place])   . Hypertension   . Gout   . Bipolar 1 disorder ([place])    History reviewed. No pertinent past surgical history. Family History:  Family History  Problem Relation Age of Onset  . Dementia Mother   . Arthritis Father   . Heart failure Neg Hx   . Kidney failure Neg Hx   . Cancer Neg Hx    Family Psychiatric  History: Positive family history of depression and possibly bipolar disorder Social History:  History  Alcohol Use No     History  Drug Use No    Social History   Social History  . Marital Status: Married    Spouse Name: N/A  . Number of Children: N/A  . Years of Education: N/A   Social History Main Topics  .  Smoking status: Never Smoker   . Smokeless tobacco: None  . Alcohol Use: No  . Drug Use: No  . Sexual Activity: Not Currently   Other Topics Concern  . None   Social History Narrative   *The patient has been married for over 30  years and is currently on disability. He lives with his wife in the [place] area and has one daughter.             Additional Social History:                         Sleep: Good  Appetite:  Good  Current Medications: Current Facility-Administered Medications  Medication Dose Route Frequency Provider Last Rate Last Dose  . acetaminophen (TYLENOL) tablet 650 mg  650 mg Oral Q6H PRN Gonzella Lex, MD   650 mg at 09/09/15 2215  . allopurinol (ZYLOPRIM) tablet 300 mg  300 mg Oral Daily Gonzella Lex, MD   300 mg at 09/10/15 1704  . alum & mag hydroxide-simeth (MAALOX/MYLANTA) 200-200-20 MG/5ML suspension 30 mL  30 mL Oral Q4H PRN Gonzella Lex, MD      . ammonium lactate (LAC-HYDRIN) 12 % lotion   Topical PRN Gonzella Lex, MD      . apixaban (ELIQUIS) tablet 5 mg  5 mg Oral BID Gonzella Lex, MD   5 mg at 09/10/15 1703  . aspirin EC tablet 81 mg  81 mg Oral Daily Gonzella Lex, MD   81 mg at 09/10/15 1703  . atorvastatin (LIPITOR) tablet 10 mg  10 mg Oral q1800 Gonzella Lex, MD   10 mg at 09/10/15 1703  . clonazePAM (KLONOPIN) tablet 1 mg  1 mg Oral QHS Gonzella Lex, MD   1 mg at 09/09/15 2205  . finasteride (PROSCAR) tablet 5 mg  5 mg Oral Daily Gonzella Lex, MD   5 mg at 09/10/15 1703  . insulin aspart (novoLOG) injection 0-15 Units  0-15 Units Subcutaneous TID WC Chauncey Mann, MD   11 Units at 09/10/15 1706  . insulin aspart (novoLOG) injection 0-5 Units  0-5 Units Subcutaneous QHS Chauncey Mann, MD   2 Units at 09/09/15 2209  . insulin aspart (novoLOG) injection 4 Units  4 Units Subcutaneous TID WC Chauncey Mann, MD   4 Units at 09/10/15 1707  . insulin glargine (LANTUS) injection 15 Units  15 Units Subcutaneous QHS Chauncey Mann, MD   15 Units at 09/09/15 2207  . ketorolac (TORADOL) 30 MG/ML injection 30 mg  30 mg Intravenous Once Gonzella Lex, MD   30 mg at 09/10/15 1715  . [START ON 09/11/2015] lisinopril (PRINIVIL,ZESTRIL) tablet 5 mg  5 mg Oral Daily John T  Clapacs, MD      . magnesium hydroxide (MILK OF MAGNESIA) suspension 30 mL  30 mL Oral Daily PRN Gonzella Lex, MD      . methylphenidate (RITALIN) tablet 5 mg  5 mg Oral BH-q7a Gonzella Lex, MD   5 mg at 09/10/15 1715  . midodrine (PROAMATINE) tablet 5 mg  5 mg Oral TID WC Gonzella Lex, MD   5 mg at 09/10/15 1704  . mirtazapine (REMERON) tablet 45 mg  45 mg Oral QHS Gonzella Lex, MD   45 mg at 09/09/15 2205  . OLANZapine (ZYPREXA) tablet 30 mg  30 mg Oral QHS Gonzella Lex, MD   30 mg at  09/09/15 2203  . polyethylene glycol (MIRALAX / GLYCOLAX) packet 17 g  17 g Oral Daily Gonzella Lex, MD   17 g at 09/10/15 1717  . tamsulosin (FLOMAX) capsule 0.4 mg  0.4 mg Oral QPC supper Gonzella Lex, MD   0.4 mg at 09/10/15 1739  . traZODone (DESYREL) tablet 25 mg  25 mg Oral QHS Gonzella Lex, MD   25 mg at 09/09/15 2204    Lab Results:  Results for orders placed or performed during the hospital encounter of 09/05/15 (from the past 48 hour(s))  Glucose, capillary     Status: Abnormal   Collection Time: 09/08/15  8:33 PM  Result Value Ref Range   Glucose-Capillary 158 (H) 65 - 99 mg/dL   Comment 1 Notify RN   CBC     Status: None   Collection Time: 09/09/15  6:34 AM  Result Value Ref Range   WBC 5.4 3.8 - 10.6 K/uL   RBC 4.46 4.40 - 5.90 MIL/uL   Hemoglobin 13.3 13.0 - 18.0 g/dL   HCT 40.2 40.0 - 52.0 %   MCV 90.2 80.0 - 100.0 fL   MCH 29.8 26.0 - 34.0 pg   MCHC 33.1 32.0 - 36.0 g/dL   RDW 13.5 11.5 - 14.5 %   Platelets 190 150 - 440 K/uL  Creatinine, serum     Status: None   Collection Time: 09/09/15  6:34 AM  Result Value Ref Range   Creatinine, Ser 1.06 0.61 - 1.24 mg/dL   GFR calc non Af Amer >60 >60 mL/min   GFR calc Af Amer >60 >60 mL/min    Comment: (NOTE) The eGFR has been calculated using the CKD EPI equation. This calculation has not been validated in all clinical situations. eGFR's persistently <60 mL/min signify possible Chronic Kidney Disease.   Glucose,  capillary     Status: Abnormal   Collection Time: 09/09/15  6:54 AM  Result Value Ref Range   Glucose-Capillary 132 (H) 65 - 99 mg/dL  Glucose, capillary     Status: Abnormal   Collection Time: 09/09/15 12:22 PM  Result Value Ref Range   Glucose-Capillary 154 (H) 65 - 99 mg/dL  Glucose, capillary     Status: Abnormal   Collection Time: 09/09/15  4:34 PM  Result Value Ref Range   Glucose-Capillary 167 (H) 65 - 99 mg/dL  Glucose, capillary     Status: Abnormal   Collection Time: 09/09/15  8:10 PM  Result Value Ref Range   Glucose-Capillary 206 (H) 65 - 99 mg/dL  Glucose, capillary     Status: None   Collection Time: 09/10/15  6:34 AM  Result Value Ref Range   Glucose-Capillary 98 65 - 99 mg/dL   Comment 1 Notify RN    Comment 2 Document in Chart   Glucose, capillary     Status: Abnormal   Collection Time: 09/10/15  4:43 PM  Result Value Ref Range   Glucose-Capillary 309 (H) 65 - 99 mg/dL    Physical Findings: AIMS: Facial and Oral Movements Muscles of Facial Expression: None, normal Lips and Perioral Area: None, normal Jaw: None, normal Tongue: None, normal,Extremity Movements Upper (arms, wrists, hands, fingers): None, normal Lower (legs, knees, ankles, toes): None, normal, Trunk Movements Neck, shoulders, hips: None, normal, Overall Severity Severity of abnormal movements (highest score from questions above): None, normal Incapacitation due to abnormal movements: None, normal Patient's awareness of abnormal movements (rate only patient's report): No Awareness, Dental Status Current problems with  teeth and/or dentures?: No Does patient usually wear dentures?: No  CIWA:    COWS:     Musculoskeletal: Strength & Muscle Tone: decreased Gait & Station: normal Patient leans: N/A  Psychiatric Specialty Exam: Review of Systems  Constitutional: Negative.   HENT: Negative.   Eyes: Negative.   Respiratory: Negative.   Cardiovascular: Negative.   Gastrointestinal:  Negative.   Musculoskeletal: Negative.   Skin: Negative.   Neurological: Negative.   Psychiatric/Behavioral: Positive for memory loss. Negative for depression, suicidal ideas, hallucinations and substance abuse. The patient is not nervous/anxious and does not have insomnia.     Blood pressure 132/90, pulse 109, temperature 98.4 F (36.9 C), temperature source Oral, resp. rate 20, height 6' (1.829 m), weight 96.163 kg (212 lb), SpO2 99 %.Body mass index is 28.75 kg/(m^2).  General Appearance: Casual  Eye Contact::  Fair  Speech:  Slow  Volume:  Decreased  Mood:  Dysphoric  Affect:  Depressed  Thought Process:  Tangential  Orientation:  Full (Time, Place, and Person)  Thought Content:  Negative  Suicidal Thoughts:  No  Homicidal Thoughts:  No  Memory:  Immediate;   Good Recent;   Fair Remote;   Fair  Judgement:  Impaired  Insight:  Shallow  Psychomotor Activity:  Decreased  Concentration:  Poor  Recall:  [employer] of Knowledge:Good  Language: Fair  Akathisia:  No  Handed:  Right  AIMS (if indicated):     Assets:  Desire for Improvement Financial Resources/Insurance Housing Intimacy Leisure Time Resilience Social Support  ADL's:  Intact  Cognition: Impaired,  Mild  Sleep:  Number of Hours: 7.45   Treatment Plan Summary: Daily contact with patient to assess and evaluate symptoms and progress in treatment, Medication management and Plan Mood is certainly better. Fragile however. Patient would do well to continue getting maintenance ECT. He is not ambulating as well as he was before. I will reconsult physical therapy. I have talked with social work and with the patient and his wife. I think that finding assisted living at least temporarily would be a good plan. Patient will continue for now current medicine except I am going to cut the lisinopril down to only 5 mg. He still has orthostatic symptoms. I agree with his wife that we want to find a best plan for discharge rather  than risk immediate rehospitalization. Additionally, I note that his blood pressure is under better control. Not having the low dips in blood pressure. Sugars are looking under better control as well. Patient continues on medicine for his recent pulmonary embolism. [name] 09/10/2015, 7:46 PM

## 2015-09-10 NOTE — [facility] Group Notes (Signed)
[facility] LCSW Aftercare Discharge Planning Group Note   09/10/2015 10:34 AM  Participation Quality:  Patient did not attend group.   Dakwon Wenberg T, MSW, LCSWA   

## 2015-09-10 NOTE — Progress Notes (Signed)
Recreation Therapy Notes  Date: 10.12.16 Time: 3:00 pm Location: Craft Room  Group Topic: Self-Esteem  Goal Area(s) Addresses:  Patient will write at least one positive trait about self. Patient will verbalize benefit of having a healthy self-esteem.  Behavioral Response: Did not attend  Intervention: I Am  Activity: Patients were given a worksheet with the letter I on it and instructed to fill it with positive traits about themselves.  Education: LRT educated patients on ways they can increase their self-esteem.  Education Outcome: Patient did not attend group.   Clinical Observations/Feedback: Patient did not attend group.  Jacquelynn Cree, LRT/CTRS 09/10/2015 4:09 PM

## 2015-09-10 NOTE — Progress Notes (Signed)
Pt is alert and oriented to person and place.  VSS. Reports that ECT "seems to help". Patient reported that he was feeling sleepy and wanted to remain in his room.  Affect is flat but he does have good eye contact.  Will cont to monitor for safety.

## 2015-09-10 NOTE — Anesthesia Procedure Notes (Signed)
Date/Time: 09/10/2015 1:12 PM Performed by: Lily Kocher Pre-anesthesia Checklist: Patient identified, Emergency Drugs available, Suction available and Patient being monitored Patient Re-evaluated:Patient Re-evaluated prior to inductionOxygen Delivery Method: Circle system utilized Preoxygenation: Pre-oxygenation with 100% oxygen Intubation Type: IV induction Ventilation: Mask ventilation without difficulty and Mask ventilation throughout procedure Airway Equipment and Method: Bite block Placement Confirmation: positive ETCO2 Dental Injury: Teeth and Oropharynx as per pre-operative assessment

## 2015-09-10 NOTE — [facility] Group Notes (Signed)
[facility] Group Notes:  (Nursing/MHT/Case Management/Adjunct)  Date:  09/10/2015  Time:  4:01 AM  Type of Therapy:  Group Therapy  Participation Level:  Did Not Attend    Summary of Progress/Problems:  Christian [name] 09/10/2015, 4:01 AM

## 2015-09-11 DIAGNOSIS — T50901A Poisoning by unspecified drugs, medicaments and biological substances, accidental (unintentional), initial encounter: Secondary | ICD-10-CM | POA: Diagnosis present

## 2015-09-11 LAB — GLUCOSE, CAPILLARY
Glucose-Capillary: 124 mg/dL — ABNORMAL HIGH (ref 65–99)
Glucose-Capillary: 225 mg/dL — ABNORMAL HIGH (ref 65–99)
Glucose-Capillary: 149 mg/dL — ABNORMAL HIGH (ref 65–99)
Glucose-Capillary: 138 mg/dL — ABNORMAL HIGH (ref 65–99)

## 2015-09-11 NOTE — Plan of Care (Signed)
Problem: Alteration in mood Goal: LTG-Pt's behavior demonstrates decreased signs of depression (Patient's behavior demonstrates decreased signs of depression to the point the patient is safe to return home and continue treatment in an outpatient setting)  Outcome: Not Progressing Pt stated he was feeling better, but pt remained in the bed majority of the evening

## 2015-09-11 NOTE — Progress Notes (Addendum)
CBG at bedtime = 138, 2 Units of insulin aspart (novoLOG) given on a SSI, will continue to monitor

## 2015-09-11 NOTE — Progress Notes (Signed)
D:  Pt denies SI/HI/AVH.  Pleasant upon approach.  Stays in his room and in bed.  Verbalized that he did not feel like getting out of and that he could not do it. A:  Patient offered support.  Encouraged to attend groups, to get up for meds and to go to day room for meals.  Explained that to continue to lay in bed would only make him start to feel weaker. R:  Patient out bed with encouragement.  Safety maintained.

## 2015-09-11 NOTE — Anesthesia Postprocedure Evaluation (Signed)
  Anesthesia Post-op Note  Patient: Christian [name]  Procedure(s) Performed: * No procedures listed *  Anesthesia type:General  Patient location: PACU  Post pain: Pain level controlled  Post assessment: Post-op Vital signs reviewed, Patient's Cardiovascular Status Stable, Respiratory Function Stable, Patent Airway and No signs of Nausea or vomiting  Post vital signs: Reviewed and stable  Last Vitals:  Filed Vitals:   09/11/15 0720  BP: 130/85  Pulse: 130  Temp: 36.8 C  Resp: 20    Level of consciousness: awake, alert  and patient cooperative  Complications: No apparent anesthesia complications

## 2015-09-11 NOTE — [facility] Group Notes (Signed)
[facility] Group Notes:  (Nursing/MHT/Case Management/Adjunct)  Date:  09/11/2015  Time:  2:26 PM  Type of Therapy:  Group Therapy  Participation Level:  Did Not Attend  Participation Quality Summary of Progress/Problems:  Christian [name] 09/11/2015, 2:26 PM

## 2015-09-11 NOTE — Progress Notes (Signed)
D: Pt denies SI/HI/AVH. Pt is pleasant and cooperative. Pt spent majority of evening in his room. Pt stated he was feeling a little better   A: Pt was offered support and encouragement. Pt was given scheduled medications. Pt was encourage to attend groups. Q 15 minute checks were done for safety.   R: Pt is taking medication. Pt has no complaints.Pt receptive to treatment and safety maintained on unit.

## 2015-09-11 NOTE — Progress Notes (Signed)
Physical Therapy Treatment Patient Details Name: Christian [name] MRN: [id number] DOB: 1951/10/30 Today's Date: 09/11/2015    History of Present Illness 64 year old man with bipolar disorder depressed. Extended hospitalization for ECT treatment of severe psychotic depression. Had been transferred last weekend to the internal medicine service for evaluation of cardiac arrhythmia. Diagnosed eventually with pulmonary embolism. Now stable and transferred back to psychiatric service. Patient states he is not feeling good but has a hard time defining it. Denies any thoughts about suicide. Denies hallucinations.    PT Comments    Pt states that he is "not doing well today". Pt states that he is still experiencing lightheadedness with movement. Orthostatic assessment was performed again today; pt was again found to have orthostatic hypotension. Pt's BP at rest and sitting was 123/86 and HR was 128; BP after standing was 92/61 with HR of 139. Pt reports feeling lightheaded with sit<>stand transfers and requested to sit down. No further mobility was performed due to safety concerns. Due to safety concerns with mobility from orthostatic state of vitals and high resting HR; pt's d/c recommendation will be updated to SNF.   Follow Up Recommendations  SNF     Equipment Recommendations  Rolling walker with 5" wheels    Recommendations for Other Services       Precautions / Restrictions Precautions Precautions: Fall Precaution Comments: orthostatic Restrictions Weight Bearing Restrictions: No    Mobility  Bed Mobility Overal bed mobility: Independent Bed Mobility: Supine to Sit     Supine to sit: Independent        Transfers Overall transfer level: Independent Equipment used: None Transfers: Sit to/from Stand (from low bed) Sit to Stand: Independent         General transfer comment: pt was able to perfprm sit<>stand transfer from low surface without RW today; RW then used due to  patient c/o lightheadedness  Ambulation/Gait Ambulation/Gait assistance:  (pt unable to perform mobility outside of sit<>stand due to lightheadedness and safety concerns)               Stairs            Wheelchair Mobility    Modified Rankin (Stroke Patients Only)       Balance Overall balance assessment: Needs assistance Sitting-balance support: Feet supported;No upper extremity supported Sitting balance-Leahy Scale: Good     Standing balance support: No upper extremity supported;Bilateral upper extremity supported   Standing balance comment: pt was able to stand ~30 seconds without BUE support and no instances of loss of balance; pt did get lightheaded with standing                    Cognition Arousal/Alertness: Awake/alert Behavior During Therapy: WFL for tasks assessed/performed Overall Cognitive Status: Within Functional Limits for tasks assessed                      Exercises Other Exercises Other Exercises: sit<>stand x 5 from low surface (x2 without assistive device; x3 with BUE on RW)     General Comments        Pertinent Vitals/Pain Pain Assessment:  (pain in R ankle at same level as earlier in the week)    Home Living                      Prior Function            PT Goals (current goals can now be found in the  care plan section) Acute Rehab PT Goals Patient Stated Goal: to go home PT Goal Formulation: With patient Time For Goal Achievement: 07/24/15 Potential to Achieve Goals: Fair Additional Goals Additional Goal #1: Pt will reduce 5xSTS time to 12 seconds or less in order to demonstrate reduced risk of falls.  Progress towards PT goals: Progressing toward goals    Frequency  Min 2X/week    PT Plan Discharge plan needs to be updated    Co-evaluation             End of Session   Activity Tolerance: Patient tolerated treatment well Patient left: in bed     Time: 6811-5726 PT Time Calculation  (min) (ACUTE ONLY): 20 min  Charges:                       G CodesGeorgina Peer 09/11/2015, 12:51 PM

## 2015-09-11 NOTE — Progress Notes (Signed)
Recreation Therapy Notes  Date: 10.13.16 Time: 3:00 pm Location: Craft Room  Group Topic: Leisure Education   Goal Area(s) Addresses:  Patient will identify activities for each letter of the alphabet. Patient will verbalize ability to integrate positive leisure into life post d/c. Patient will verbalize ability to use leisure as a Associate Professor.  Behavioral Response: Did not attend   Intervention: Leisure Alphabet  Activity: Patients were given a leisure alphabet worksheet and instructed to write a healthy leisure activity for each letter of the alphabet.  Education: LRT educated patients on what they needed to participate in leisure.  Education Outcome: Patient did not attend group.  Clinical Observations/Feedback: Patient did not attend group.  Jacquelynn Cree, LRT/CTRS 09/11/2015 4:36 PM

## 2015-09-11 NOTE — Plan of Care (Signed)
Problem: Alteration in mood Goal: LTG-Pt's behavior demonstrates decreased signs of depression (Patient's behavior demonstrates decreased signs of depression to the point the patient is safe to return home and continue treatment in an outpatient setting)  Outcome: Not Progressing Patient quiet and reserved.  Presents with sad affect.  Stays to himself and just wants to stay in bed/

## 2015-09-11 NOTE — Progress Notes (Signed)
Recreation Therapy Notes  At approximately 10:30 am, LRT attempted treatment session. Patient sleeping. LRT will attempt treatment session at a later time.  Jacquelynn Cree, LRT/CTRS 09/11/2015 1:24 PM

## 2015-09-11 NOTE — Progress Notes (Signed)
[facility] MD Progress Note  09/11/2015 7:52 PM Christian [name]  MRN:  [id number] Patient with severe major depression that has responded well to ECT. Patient says his mood is "about the same". He means that he is mild to moderately depressed but does not have suicidal thoughts. He has been participating a little bit with physical therapy but otherwise stays mostly to himself. Chronic fatigue. No other specific new problems. He still gets orthostatic at times but his baseline blood pressure is under pretty good control. Still working on getting his sugars under control.  Principal Problem: Bipolar disorder, now depressed [facility]) Diagnosis:   Patient Active Problem List   Diagnosis Date Noted  . Overdose [T50.901A] 09/11/2015  . Acute respiratory failure with hypoxia (HCC) [J96.01] 09/05/2015  . Elevated troponin [R79.89] 09/05/2015  . Somnolence [R40.0] 09/05/2015  . Bipolar depression (HCC) [F31.30] 09/05/2015  . Acute bilateral deep vein thrombosis (DVT) of femoral veins (HCC) [I82.413] 09/05/2015  . Bilateral pulmonary embolism (HCC) [I26.99] 09/02/2015  . Labile hypertension [I10]   . Pulmonary embolism with acute cor pulmonale (HCC) [I26.09]   . Dyspnea [R06.00]   . Orthostatic hypotension [I95.1]   . Bipolar disorder, in partial remission, most recent episode depressed (HCC) [F31.75]   . Syncope, near [R55] 08/31/2015  . Affective psychosis, bipolar (HCC) [F31.9]   . Bipolar disorder, now depressed (HCC) [F31.30] 08/28/2015  . Bipolar I disorder, severe, current or most recent episode depressed, with psychotic features (HCC) [F31.5]   . Pressure ulcer [L89.90] 07/30/2015  . Protein-calorie malnutrition, severe (HCC) [E43] 07/30/2015  . Dehydration [E86.0] 07/29/2015  . Bipolar disorder, current episode depressed, severe, with psychotic features (HCC) [F31.5] 07/09/2015  . Diabetes (HCC) [E11.9] 07/09/2015  . UTI (urinary tract infection) [N39.0] 06/19/2014  . Positive blood culture  [R78.81] 06/19/2014  . Bipolar affective disorder, current episode manic with psychotic symptoms (HCC) [F31.2] 06/17/2014  . Type II or unspecified type diabetes mellitus with unspecified complication, uncontrolled [E11.8, E11.65] 06/02/2014  . Other and unspecified hyperlipidemia [E78.5] 06/02/2014  . OSA on CPAP [G47.33] 06/02/2014  . 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 Gout [M10.9] 06/02/2014  . Hypertension [I10]    Total Time spent with patient: 30 minutes  Past Psychiatric History: Long-standing history of bipolar disorder currently a long severe depression with psychotic features responding to ECT. Patient has had difficulty responding which has been getting worse with time as he has become more chronically impaired. Multiple suicide attempts in the past. Multiple hospitalizations.  Past Medical History:  Past Medical History  Diagnosis Date  . Mood swings (HCC)   . Type II or unspecified type diabetes mellitus with unspecified complication, uncontrolled 06/02/2014  . Other and unspecified hyperlipidemia 06/02/2014  . OSA on CPAP 06/02/2014  . 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 Bipolar disorder (HCC)   . Hypertension   . Gout   . Bipolar 1 disorder (HCC)    History reviewed. No pertinent past surgical history. Family History:  Family History  Problem Relation Age of Onset  . Dementia Mother   . Arthritis Father   . Heart failure Neg Hx   . Kidney failure Neg Hx   . Cancer Neg Hx    Family Psychiatric  History: Positive family history of depression and possibly bipolar disorder Social History:  History  Alcohol Use No     History  Drug Use No    Social History   Social  History  . Marital Status: Married    Spouse Name: N/A  . Number of Children: N/A  . Years of Education: N/A   Social History Main Topics  . Smoking status: Never Smoker   . Smokeless tobacco: None  .  Alcohol Use: No  . Drug Use: No  . Sexual Activity: Not Currently   Other Topics Concern  . None   Social History Narrative   *The patient has been married for over 30 years and is currently on disability. He lives with his wife in the [place] area and has one daughter.             Additional Social History:                         Sleep: Good  Appetite:  Good  Current Medications: Current Facility-Administered Medications  Medication Dose Route Frequency Provider Last Rate Last Dose  . acetaminophen (TYLENOL) tablet 650 mg  650 mg Oral Q6H PRN Audery Amel, MD   650 mg at 09/09/15 2215  . allopurinol (ZYLOPRIM) tablet 300 mg  300 mg Oral Daily Audery Amel, MD   300 mg at 09/11/15 0918  . alum & mag hydroxide-simeth (MAALOX/MYLANTA) 200-200-20 MG/5ML suspension 30 mL  30 mL Oral Q4H PRN Audery Amel, MD      . ammonium lactate (LAC-HYDRIN) 12 % lotion   Topical PRN Audery Amel, MD      . apixaban (ELIQUIS) tablet 5 mg  5 mg Oral BID Audery Amel, MD   5 mg at 09/11/15 0918  . aspirin EC tablet 81 mg  81 mg Oral Daily Audery Amel, MD   81 mg at 09/11/15 0413  . atorvastatin (LIPITOR) tablet 10 mg  10 mg Oral q1800 Audery Amel, MD   10 mg at 09/11/15 1703  . clonazePAM (KLONOPIN) tablet 1 mg  1 mg Oral QHS Audery Amel, MD   1 mg at 09/10/15 2248  . finasteride (PROSCAR) tablet 5 mg  5 mg Oral Daily Audery Amel, MD   5 mg at 09/11/15 6438  . insulin aspart (novoLOG) injection 0-15 Units  0-15 Units Subcutaneous TID WC Darliss Ridgel, MD   2 Units at 09/11/15 1704  . insulin aspart (novoLOG) injection 0-5 Units  0-5 Units Subcutaneous QHS Darliss Ridgel, MD   3 Units at 09/10/15 2249  . insulin aspart (novoLOG) injection 4 Units  4 Units Subcutaneous TID WC Darliss Ridgel, MD   4 Units at 09/11/15 1705  . insulin glargine (LANTUS) injection 15 Units  15 Units Subcutaneous QHS Darliss Ridgel, MD   15 Units at 09/10/15 2250  . ketorolac (TORADOL) 30 MG/ML  injection 30 mg  30 mg Intravenous Once Audery Amel, MD   30 mg at 09/10/15 1715  . lisinopril (PRINIVIL,ZESTRIL) tablet 5 mg  5 mg Oral Daily Audery Amel, MD   5 mg at 09/11/15 3779  . magnesium hydroxide (MILK OF MAGNESIA) suspension 30 mL  30 mL Oral Daily PRN Audery Amel, MD      . methylphenidate (RITALIN) tablet 5 mg  5 mg Oral BH-q7a Audery Amel, MD   5 mg at 09/11/15 0800  . midodrine (PROAMATINE) tablet 5 mg  5 mg Oral TID WC Audery Amel, MD   5 mg at 09/11/15 1704  . mirtazapine (REMERON) tablet 45 mg  45 mg Oral QHS  Audery Amel, MD   45 mg at 09/10/15 2247  . OLANZapine (ZYPREXA) tablet 30 mg  30 mg Oral QHS Audery Amel, MD   30 mg at 09/10/15 2248  . polyethylene glycol (MIRALAX / GLYCOLAX) packet 17 g  17 g Oral Daily Audery Amel, MD   17 g at 09/11/15 0918  . tamsulosin (FLOMAX) capsule 0.4 mg  0.4 mg Oral QPC supper Audery Amel, MD   0.4 mg at 09/11/15 1704  . traZODone (DESYREL) tablet 25 mg  25 mg Oral QHS Audery Amel, MD   25 mg at 09/10/15 2248    Lab Results:  Results for orders placed or performed during the hospital encounter of 09/05/15 (from the past 48 hour(s))  Glucose, capillary     Status: Abnormal   Collection Time: 09/09/15  8:10 PM  Result Value Ref Range   Glucose-Capillary 206 (H) 65 - 99 mg/dL  Glucose, capillary     Status: None   Collection Time: 09/10/15  6:34 AM  Result Value Ref Range   Glucose-Capillary 98 65 - 99 mg/dL   Comment 1 Notify RN    Comment 2 Document in Chart   Glucose, capillary     Status: Abnormal   Collection Time: 09/10/15  4:43 PM  Result Value Ref Range   Glucose-Capillary 309 (H) 65 - 99 mg/dL  Glucose, capillary     Status: Abnormal   Collection Time: 09/10/15  8:56 PM  Result Value Ref Range   Glucose-Capillary 263 (H) 65 - 99 mg/dL   Comment 1 Notify RN    Comment 2 Document in Chart   Glucose, capillary     Status: Abnormal   Collection Time: 09/11/15  6:42 AM  Result Value Ref Range    Glucose-Capillary 124 (H) 65 - 99 mg/dL   Comment 1 Notify RN   Glucose, capillary     Status: Abnormal   Collection Time: 09/11/15 12:22 PM  Result Value Ref Range   Glucose-Capillary 225 (H) 65 - 99 mg/dL  Glucose, capillary     Status: Abnormal   Collection Time: 09/11/15  4:59 PM  Result Value Ref Range   Glucose-Capillary 149 (H) 65 - 99 mg/dL    Physical Findings: AIMS: Facial and Oral Movements Muscles of Facial Expression: None, normal Lips and Perioral Area: None, normal Jaw: None, normal Tongue: None, normal,Extremity Movements Upper (arms, wrists, hands, fingers): None, normal Lower (legs, knees, ankles, toes): None, normal, Trunk Movements Neck, shoulders, hips: None, normal, Overall Severity Severity of abnormal movements (highest score from questions above): None, normal Incapacitation due to abnormal movements: None, normal Patient's awareness of abnormal movements (rate only patient's report): No Awareness, Dental Status Current problems with teeth and/or dentures?: No Does patient usually wear dentures?: No  CIWA:    COWS:     Musculoskeletal: Strength & Muscle Tone: decreased Gait & Station: normal Patient leans: N/A  Psychiatric Specialty Exam: Review of Systems  Constitutional: Negative.   HENT: Negative.   Eyes: Negative.   Respiratory: Negative.   Cardiovascular: Negative.   Gastrointestinal: Negative.   Musculoskeletal: Negative.   Skin: Negative.   Neurological: Negative.   Psychiatric/Behavioral: Positive for memory loss. Negative for depression, suicidal ideas, hallucinations and substance abuse. The patient is not nervous/anxious and does not have insomnia.     Blood pressure 130/85, pulse 130, temperature 98.2 F (36.8 C), temperature source Oral, resp. rate 20, height 6' (1.829 m), weight 96.163 kg (212 lb), SpO2  99 %.Body mass index is 28.75 kg/(m^2).  General Appearance: Casual  Eye Contact::  Fair  Speech:  Slow  Volume:  Decreased   Mood:  Dysphoric  Affect:  Depressed  Thought Process:  Tangential  Orientation:  Full (Time, Place, and Person)  Thought Content:  Negative  Suicidal Thoughts:  No  Homicidal Thoughts:  No  Memory:  Immediate;   Good Recent;   Fair Remote;   Fair  Judgement:  Impaired  Insight:  Shallow  Psychomotor Activity:  Decreased  Concentration:  Poor  Recall:  [employer] of Knowledge:Good  Language: Fair  Akathisia:  No  Handed:  Right  AIMS (if indicated):     Assets:  Desire for Improvement Financial Resources/Insurance Housing Intimacy Leisure Time Resilience Social Support  ADL's:  Intact  Cognition: Impaired,  Mild  Sleep:  Number of Hours: 5.5   Treatment Plan Summary: Daily contact with patient to assess and evaluate symptoms and progress in treatment, Medication management and Plan Case discussed with treatment team today. We are working on finding a safe disposition. It looks like physical therapy suggests rehabilitation which will be very convenient as a transitional plan. Next ECT is Aarti scheduled. Continue current medicine for now. Continue encouraging him daily to get more physically active. No change for today to his medicine for his blood pressure or diabetes. Additionally, I note that his blood pressure is under better control. Not having the low dips in blood pressure. Sugars are looking under better control as well. Patient continues on medicine for his recent pulmonary embolism. [name] 09/11/2015, 7:52 PM

## 2015-09-11 NOTE — Progress Notes (Signed)
Observed in room, awake in bed, A&OX4; patient said, "I had uneventful day, didn't participate too much .." Anticipated course of care discussed, he understands he will be NPO after MN for ECT.

## 2015-09-12 ENCOUNTER — Inpatient Hospital Stay: Payer: Commercial Managed Care - HMO

## 2015-09-12 ENCOUNTER — Inpatient Hospital Stay: Payer: Commercial Managed Care - HMO | Admitting: *Deleted

## 2015-09-12 ENCOUNTER — Encounter: Payer: Self-pay | Admitting: *Deleted

## 2015-09-12 DIAGNOSIS — F333 Major depressive disorder, recurrent, severe with psychotic symptoms: Secondary | ICD-10-CM | POA: Diagnosis not present

## 2015-09-12 DIAGNOSIS — I10 Essential (primary) hypertension: Secondary | ICD-10-CM | POA: Diagnosis not present

## 2015-09-12 LAB — GLUCOSE, CAPILLARY
Glucose-Capillary: 124 mg/dL — ABNORMAL HIGH (ref 65–99)
Glucose-Capillary: 208 mg/dL — ABNORMAL HIGH (ref 65–99)
Glucose-Capillary: 359 mg/dL — ABNORMAL HIGH (ref 65–99)
Glucose-Capillary: 173 mg/dL — ABNORMAL HIGH (ref 65–99)

## 2015-09-12 MED ORDER — SUCCINYLCHOLINE CHLORIDE 20 MG/ML IJ SOLN
150.0000 mg | Freq: Once | INTRAMUSCULAR | Status: AC
  Administered 2015-09-12: 150 mg via INTRAVENOUS

## 2015-09-12 MED ORDER — TUBERCULIN PPD 5 UNIT/0.1ML ID SOLN
5.0000 [IU] | Freq: Once | INTRADERMAL | Status: AC
Start: 2015-09-12 — End: 2015-09-14
  Administered 2015-09-12: 5 [IU] via INTRADERMAL
  Filled 2015-09-12: qty 0.1

## 2015-09-12 MED ORDER — SODIUM CHLORIDE 0.9 % IV SOLN: 250.0000 mL | Freq: Once | INTRAVENOUS | Status: DC

## 2015-09-12 MED ORDER — LABETALOL HCL 5 MG/ML IV SOLN
20.0000 mg | Freq: Once | INTRAVENOUS | Status: AC
  Administered 2015-09-12: 20 mg via INTRAVENOUS

## 2015-09-12 MED ORDER — LABETALOL HCL 5 MG/ML IV SOLN
10.0000 mg | INTRAVENOUS | Status: AC | PRN
  Administered 2015-09-12 (×2): 10 mg via INTRAVENOUS

## 2015-09-12 MED ORDER — KETAMINE HCL 10 MG/ML IJ SOLN
120.0000 mg | Freq: Once | INTRAMUSCULAR | Status: AC
Start: 2015-09-12 — End: 2015-09-12
  Administered 2015-09-12: 120 mg via INTRAVENOUS

## 2015-09-12 MED ORDER — LIDOCAINE HCL (CARDIAC) 20 MG/ML IV SOLN
4.0000 mg | Freq: Once | INTRAVENOUS | Status: AC
  Administered 2015-09-12: 4 mg via INTRAVENOUS

## 2015-09-12 MED ORDER — LABETALOL HCL 5 MG/ML IV SOLN
20.0000 mg | Freq: Once | INTRAVENOUS | Status: AC
Start: 2015-09-12 — End: 2015-09-12
  Administered 2015-09-12: 20 mg via INTRAVENOUS

## 2015-09-12 NOTE — Procedures (Signed)
ECT SERVICES Physician's Interval Evaluation & Treatment Note  Patient Identification: KORBAN MCCAUGHAN MRN:  [id number] Date of Evaluation:  09/12/2015 TX #: [id number]  MADRS:   MMSE:   P.E. Findings:  Continues to have some lability of blood pressure.  Psychiatric Interval Note:  Mood slightly better although still only about 50% by his estimation no acute psychosis or suicidal threats.  Subjective:  Patient is a 64 y.o. male seen for evaluation for Electroconvulsive Therapy. No specific new complaints  Treatment Summary:   []   Right Unilateral             [x]  Bilateral   % Energy : 1.0 ms, 100%   Impedance: 750 ohms  Seizure Energy Index: 2345 V squared  Postictal Suppression Index: 30%  Seizure Concordance Index: 99%  Medications  Pre Shock: Xylocaine 4 mg, labetalol 20 mg, ketamine 120 mg, succinylcholine 120 mg  Post Shock: Labetalol 20 mg  Seizure Duration: 17 seconds by EMG, 56 seconds by EEG   Comments: Patient will be seen for maintenance ideally next Wednesday the 19th.   Lungs:  [x]   Clear to auscultation               []  Other:   Heart:    [x]   Regular rhythm             []  irregular rhythm    [x]   Previous H&P reviewed, patient examined and there are NO CHANGES                 []   Previous H&P reviewed, patient examined and there are changes noted.   [name], MD 10/14/201611:39 AM

## 2015-09-12 NOTE — Anesthesia Postprocedure Evaluation (Signed)
  Anesthesia Post-op Note  Patient: Christian [name]  Procedure(s) Performed: * No procedures listed *  Anesthesia type:General  Patient location: PACU  Post pain: Pain level controlled  Post assessment: Post-op Vital signs reviewed, Patient's Cardiovascular Status Stable, Respiratory Function Stable, Patent Airway and No signs of Nausea or vomiting  Post vital signs: Reviewed and stable  Last Vitals:  Filed Vitals:   09/12/15 1225  BP:   Pulse: 101  Temp:   Resp: 18    Level of consciousness: awake, alert  and patient cooperative  Complications: No apparent anesthesia complications

## 2015-09-12 NOTE — Progress Notes (Signed)
Recreation Therapy Notes  Date: 10.14.16 Time: 3:00 pm Location: Craft Room  Group Topic: Coping Skills  Goal Area(s) Addresses:  Patient will participate in coping skills. Patient will verbalize benefit of using art as a coping skill.  Behavioral Response: Did not attend  Intervention: Coloring  Activity: Patients were given coloring sheets and instructed to color thinking about what emotions they were feeling and what they were focused on.  Education: LRT educated patients on why art is a good Associate Professor.   Education Outcome: Patient did not attend group.   Clinical Observations/Feedback: Patient did not attend group.  Jacquelynn Cree, LRT/CTRS 09/12/2015 5:07 PM

## 2015-09-12 NOTE — Anesthesia Procedure Notes (Signed)
Date/Time: 09/12/2015 11:50 AM Performed by: Lily Kocher Pre-anesthesia Checklist: Patient identified, Emergency Drugs available, Suction available and Patient being monitored Patient Re-evaluated:Patient Re-evaluated prior to inductionOxygen Delivery Method: Circle system utilized Preoxygenation: Pre-oxygenation with 100% oxygen Intubation Type: IV induction Ventilation: Mask ventilation without difficulty and Mask ventilation throughout procedure Airway Equipment and Method: Bite block Placement Confirmation: positive ETCO2 Dental Injury: Teeth and Oropharynx as per pre-operative assessment

## 2015-09-12 NOTE — Progress Notes (Signed)
Patient tolerated ECT.His mood is still depressed,rated his depression 5/10.Denies suicidal ideation and hallucination.Stayed in room most of the time.Minimal interaction with staff & peers.Still uses wheelchair.Compliant with medications.

## 2015-09-12 NOTE — [facility] Group Notes (Signed)
[facility] LCSW Group Therapy  09/12/2015 2:59 PM  Type of Therapy:  Group Therapy  Participation Level:  Did Not Attend  Modes of Intervention:  Discussion, Education, Socialization and Support  Summary of Progress/Problems: Feelings around Relapse. Group members discussed the meaning of relapse and shared personal stories of relapse, how it affected them and others, and how they perceived themselves during this time. Group members were encouraged to identify triggers, warning signs and coping skills used when facing the possibility of relapse. Social supports were discussed and explored in detail.   Christian [name] L Rodnisha Blomgren MSW, LCSWA  09/12/2015, 2:59 P 

## 2015-09-12 NOTE — Anesthesia Preprocedure Evaluation (Signed)
Anesthesia Evaluation  Patient identified by MRN, date of birth, ID band Patient unresponsive  General Assessment Comment:He is very somnolent--reviewed recent hx with his nurse.  Reviewed: Allergy & Precautions, NPO status , Patient's Chart, lab work & pertinent test results  Airway Mallampati: III  TM Distance: >3 FB Neck ROM: Full    Dental  (+) Poor Dentition   Pulmonary shortness of breath and with exertion, sleep apnea and Continuous Positive Airway Pressure Ventilation ,    Pulmonary exam normal        Cardiovascular Exercise Tolerance: Poor hypertension, + Peripheral Vascular Disease  Normal cardiovascular exam     Neuro/Psych Depression Bipolar Disorder    GI/Hepatic   Endo/Other  diabetes, Poorly Controlled, Type 2BG 124.  Renal/GU      Musculoskeletal   Abdominal (+)  Abdomen: soft.    Peds  Hematology   Anesthesia Other Findings   Reproductive/Obstetrics                             Anesthesia Physical Anesthesia Plan  ASA: III  Anesthesia Plan: General   Post-op Pain Management:    Induction: Intravenous  Airway Management Planned: Mask  Additional Equipment:   Intra-op Plan:   Post-operative Plan:   Informed Consent:   Plan Discussed with: CRNA  Anesthesia Plan Comments:         Anesthesia Quick Evaluation

## 2015-09-12 NOTE — Transfer of Care (Signed)
Immediate Anesthesia Transfer of Care Note  Patient: Christian [name]  Procedure(s) Performed: ECT  Patient Location: PACU  Anesthesia Type:General  Level of Consciousness: sedated  Airway & Oxygen Therapy: Patient Spontanous Breathing and Patient connected to face mask oxygen  Post-op Assessment: Report given to RN and Post -op Vital signs reviewed and stable  Post vital signs: Reviewed and stable  Last Vitals:  Filed Vitals:   09/12/15 1207  BP: 150/108  Pulse: 101  Temp: 37 C  Resp: 16    Complications: No apparent anesthesia complications

## 2015-09-12 NOTE — Plan of Care (Signed)
Problem: Alteration in mood Goal: LTG-Pt's behavior demonstrates decreased signs of depression (Patient's behavior demonstrates decreased signs of depression to the point the patient is safe to return home and continue treatment in an outpatient setting)  Outcome: Not Progressing His mood is sad & depressed.

## 2015-09-12 NOTE — H&P (Signed)
Christian [name] is an 64 y.o. male.   Chief Complaint: Patient continues to be tired also have some orthostatic changes. Mood is about halfway better no current psychotic or suicidal statements. HPI: Some improvement in mood since last treatment otherwise no significant change.  Past Medical History  Diagnosis Date  . Mood swings (HCC)   . Type II or unspecified type diabetes mellitus with unspecified complication, uncontrolled 06/02/2014  . Other and unspecified hyperlipidemia 06/02/2014  . OSA on CPAP 06/02/2014  . 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 Bipolar disorder (HCC)   . Hypertension   . Gout   . Bipolar 1 disorder (HCC)     History reviewed. No pertinent past surgical history.  Family History  Problem Relation Age of Onset  . Dementia Mother   . Arthritis Father   . Heart failure Neg Hx   . Kidney failure Neg Hx   . Cancer Neg Hx    Social History:  reports that he has never smoked. He does not have any smokeless tobacco history on file. He reports that he does not drink alcohol or use illicit drugs.  Allergies: No Known Allergies  Medications Prior to Admission  Medication Sig Dispense Refill  . allopurinol (ZYLOPRIM) 300 MG tablet Take 1 tablet (300 mg total) by mouth daily. For gout    . apixaban (ELIQUIS) 5 MG TABS tablet Take 2 tablets (10 mg total) by mouth 2 (two) times daily. 28 tablet 0  . apixaban (ELIQUIS) 5 MG TABS tablet Take 1 tablet (5 mg total) by mouth 2 (two) times daily. 60 tablet 6  . aspirin EC 81 MG tablet Take 81 mg by mouth daily.    Marland Kitchen atorvastatin (LIPITOR) 10 MG tablet Take 1 tablet (10 mg total) by mouth daily. For high cholesterol    . cholecalciferol (VITAMIN D) 400 UNITS TABS tablet Take 800 Units by mouth daily.    . clonazePAM (KLONOPIN) 1 MG tablet Take 1 tablet (1 mg total) by mouth at bedtime. 30 tablet 0  . docusate sodium (COLACE) 100 MG capsule Take 2 capsules (200 mg total) by mouth 2 (two)  times daily. 10 capsule 0  . feeding supplement, GLUCERNA SHAKE, (GLUCERNA SHAKE) LIQD Take 237 mLs by mouth 3 (three) times daily with meals.  0  . finasteride (PROSCAR) 5 MG tablet Take 1 tablet (5 mg total) by mouth daily. 1 tablet 0  . insulin aspart (NOVOLOG) 100 UNIT/ML injection Inject 0-9 Units into the skin 3 (three) times daily with meals. 10 mL 11  . insulin aspart (NOVOLOG) 100 UNIT/ML injection Inject 5 Units into the skin 3 (three) times daily with meals. 10 mL 11  . insulin aspart (NOVOLOG) 100 UNIT/ML injection Inject 0-9 Units into the skin 3 (three) times daily with meals. 10 mL 11  . insulin glargine (LANTUS) 100 UNIT/ML injection Inject 0.12 mLs (12 Units total) into the skin at bedtime. 10 mL 11  . levofloxacin (LEVAQUIN) 750 MG tablet Take 1 tablet (750 mg total) by mouth daily. Please continue for 3 more days to complete course 3 tablet 0  . midodrine (PROAMATINE) 5 MG tablet Take 1 tablet (5 mg total) by mouth 3 (three) times daily with meals. 90 tablet 6  . mirtazapine (REMERON) 30 MG tablet Take 30 mg by mouth at bedtime.    Marland Kitchen OLANZapine zydis (ZYPREXA) 15 MG disintegrating tablet Take 2 tablets (30 mg total) by mouth at bedtime. 1 tablet 0  .  ondansetron (ZOFRAN) 4 MG tablet Take 1 tablet (4 mg total) by mouth every 6 (six) hours as needed for nausea. 20 tablet 0  . polyethylene glycol (MIRALAX / GLYCOLAX) packet Take 17 g by mouth daily. 14 each 0  . tamsulosin (FLOMAX) 0.4 MG CAPS capsule Take 1 capsule (0.4 mg total) by mouth at bedtime. 30 capsule 9  . traZODone (DESYREL) 50 MG tablet Take 0.5 tablets (25 mg total) by mouth 3 (three) times daily. (Patient taking differently: Take 200 mg by mouth at bedtime. )      Results for orders placed or performed during the hospital encounter of 09/05/15 (from the past 48 hour(s))  Glucose, capillary     Status: Abnormal   Collection Time: 09/10/15  4:43 PM  Result Value Ref Range   Glucose-Capillary 309 (H) 65 - 99 mg/dL   Glucose, capillary     Status: Abnormal   Collection Time: 09/10/15  8:56 PM  Result Value Ref Range   Glucose-Capillary 263 (H) 65 - 99 mg/dL   Comment 1 Notify RN    Comment 2 Document in Chart   Glucose, capillary     Status: Abnormal   Collection Time: 09/11/15  6:42 AM  Result Value Ref Range   Glucose-Capillary 124 (H) 65 - 99 mg/dL   Comment 1 Notify RN   Glucose, capillary     Status: Abnormal   Collection Time: 09/11/15 12:22 PM  Result Value Ref Range   Glucose-Capillary 225 (H) 65 - 99 mg/dL  Glucose, capillary     Status: Abnormal   Collection Time: 09/11/15  4:59 PM  Result Value Ref Range   Glucose-Capillary 149 (H) 65 - 99 mg/dL  Glucose, capillary     Status: Abnormal   Collection Time: 09/11/15  8:42 PM  Result Value Ref Range   Glucose-Capillary 138 (H) 65 - 99 mg/dL  Glucose, capillary     Status: Abnormal   Collection Time: 09/12/15  7:08 AM  Result Value Ref Range   Glucose-Capillary 124 (H) 65 - 99 mg/dL   No results found.  Review of Systems  Constitutional: Negative.   HENT: Negative.   Eyes: Negative.   Respiratory: Negative.   Cardiovascular: Negative.   Gastrointestinal: Negative.   Musculoskeletal: Negative.   Skin: Negative.   Neurological: Positive for dizziness.  Psychiatric/Behavioral: Positive for depression and memory loss. Negative for suicidal ideas, hallucinations and substance abuse. The patient is not nervous/anxious and does not have insomnia.     Blood pressure 117/75, pulse 113, temperature 97.4 F (36.3 C), temperature source Tympanic, resp. rate 0, height 6' (1.829 m), weight 96.163 kg (212 lb), SpO2 99 %. Physical Exam  Nursing note and vitals reviewed. Constitutional: He appears well-developed and well-nourished.  HENT:  Head: Normocephalic and atraumatic.  Eyes: Conjunctivae are normal. Pupils are equal, round, and reactive to light.  Neck: Normal range of motion.  Cardiovascular: Normal rate, regular rhythm and  normal heart sounds.  Exam reveals no friction rub.   No murmur heard. Respiratory: Effort normal and breath sounds normal. No respiratory distress. He has no wheezes.  GI: Soft.  Musculoskeletal: Normal range of motion.  Neurological: He is alert.  Skin: Skin is warm and dry.  Psychiatric: Judgment and thought content normal. His affect is blunt. His speech is delayed. He is slowed and withdrawn. He exhibits abnormal recent memory.     Assessment/Plan Patient received treatment today and our next treatment is scheduled for one week  Wednesday the  19th   Jasmeen Fritsch 09/12/2015, 11:36 AM

## 2015-09-12 NOTE — Plan of Care (Signed)
Problem: Ineffective individual coping Goal: STG: Patient will remain free from self harm Outcome: Progressing Medications administered as ordered, at bedtime, by the physician, medications Therapeutic Effects, SEs and Adverse effects discussed, questions encouraged; no PRN given, 15 minute checks maintained for safety, NPO for upcoming ECT, clinical and moral support provided, patient encouraged to continue to express feelings and demonstrate safe care. Patient remain free from harm, will continue to monitor.

## 2015-09-12 NOTE — [facility] Group Notes (Signed)
[facility] LCSW Group Therapy  09/12/2015 12:40 PM  Type of Therapy:  Group Therapy  Participation Level:  Did Not Attend   Lulu Riding, MSW, LCSWA 09/12/2015, 12:40 PM

## 2015-09-12 NOTE — Progress Notes (Signed)
[facility] MD Progress Note  09/12/2015 5:15 PM Christian [name]  MRN:  [id number] Patient with severe major depression that has responded well to ECT. Patient says his mood is "about the same". He means that he is mild to moderately depressed but does not have suicidal thoughts. He has been participating a little bit with physical therapy but otherwise stays mostly to himself. Chronic fatigue. No other specific new problems. He still gets orthostatic at times but his baseline blood pressure is under pretty good control. Still working on getting his sugars under control.  Principal Problem: Bipolar disorder, now depressed [facility]) Diagnosis:   Patient Active Problem List   Diagnosis Date Noted  . Overdose [T50.901A] 09/11/2015  . Acute respiratory failure with hypoxia (HCC) [J96.01] 09/05/2015  . Elevated troponin [R79.89] 09/05/2015  . Somnolence [R40.0] 09/05/2015  . Bipolar depression (HCC) [F31.30] 09/05/2015  . Acute bilateral deep vein thrombosis (DVT) of femoral veins (HCC) [I82.413] 09/05/2015  . Bilateral pulmonary embolism (HCC) [I26.99] 09/02/2015  . Labile hypertension [I10]   . Pulmonary embolism with acute cor pulmonale (HCC) [I26.09]   . Dyspnea [R06.00]   . Orthostatic hypotension [I95.1]   . Bipolar disorder, in partial remission, most recent episode depressed (HCC) [F31.75]   . Syncope, near [R55] 08/31/2015  . Affective psychosis, bipolar (HCC) [F31.9]   . Bipolar disorder, now depressed (HCC) [F31.30] 08/28/2015  . Bipolar I disorder, severe, current or most recent episode depressed, with psychotic features (HCC) [F31.5]   . Pressure ulcer [L89.90] 07/30/2015  . Protein-calorie malnutrition, severe (HCC) [E43] 07/30/2015  . Dehydration [E86.0] 07/29/2015  . Bipolar disorder, current episode depressed, severe, with psychotic features (HCC) [F31.5] 07/09/2015  . Diabetes (HCC) [E11.9] 07/09/2015  . UTI (urinary tract infection) [N39.0] 06/19/2014  . Positive blood culture  [R78.81] 06/19/2014  . Bipolar affective disorder, current episode manic with psychotic symptoms (HCC) [F31.2] 06/17/2014  . Type II or unspecified type diabetes mellitus with unspecified complication, uncontrolled [E11.8, E11.65] 06/02/2014  . Other and unspecified hyperlipidemia [E78.5] 06/02/2014  . OSA on CPAP [G47.33] 06/02/2014  . 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 Gout [M10.9] 06/02/2014  . Hypertension [I10]    Total Time spent with patient: 30 minutes  Past Psychiatric History: Long-standing history of bipolar disorder currently a long severe depression with psychotic features responding to ECT. Patient has had difficulty responding which has been getting worse with time as he has become more chronically impaired. Multiple suicide attempts in the past. Multiple hospitalizations.  Past Medical History:  Past Medical History  Diagnosis Date  . Mood swings (HCC)   . Type II or unspecified type diabetes mellitus with unspecified complication, uncontrolled 06/02/2014  . Other and unspecified hyperlipidemia 06/02/2014  . OSA on CPAP 06/02/2014  . 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 Bipolar disorder (HCC)   . Hypertension   . Gout   . Bipolar 1 disorder (HCC)    History reviewed. No pertinent past surgical history. Family History:  Family History  Problem Relation Age of Onset  . Dementia Mother   . Arthritis Father   . Heart failure Neg Hx   . Kidney failure Neg Hx   . Cancer Neg Hx    Family Psychiatric  History: Positive family history of depression and possibly bipolar disorder Social History:  History  Alcohol Use No     History  Drug Use No    Social History   Social  History  . Marital Status: Married    Spouse Name: N/A  . Number of Children: N/A  . Years of Education: N/A   Social History Main Topics  . Smoking status: Never Smoker   . Smokeless tobacco: None  .  Alcohol Use: No  . Drug Use: No  . Sexual Activity: Not Currently   Other Topics Concern  . None   Social History Narrative   *The patient has been married for over 30 years and is currently on disability. He lives with his wife in the [place] area and has one daughter.             Additional Social History:                         Sleep: Good  Appetite:  Good  Current Medications: Current Facility-Administered Medications  Medication Dose Route Frequency Provider Last Rate Last Dose  . 0.9 %  sodium chloride infusion  250 mL Intravenous Once Audery Amel, MD      . acetaminophen (TYLENOL) tablet 650 mg  650 mg Oral Q6H PRN Audery Amel, MD   650 mg at 09/09/15 2215  . allopurinol (ZYLOPRIM) tablet 300 mg  300 mg Oral Daily Audery Amel, MD   300 mg at 09/12/15 1343  . alum & mag hydroxide-simeth (MAALOX/MYLANTA) 200-200-20 MG/5ML suspension 30 mL  30 mL Oral Q4H PRN Audery Amel, MD      . ammonium lactate (LAC-HYDRIN) 12 % lotion   Topical PRN Audery Amel, MD      . apixaban (ELIQUIS) tablet 5 mg  5 mg Oral BID Audery Amel, MD   5 mg at 09/12/15 1343  . aspirin EC tablet 81 mg  81 mg Oral Daily Audery Amel, MD   81 mg at 09/12/15 1342  . atorvastatin (LIPITOR) tablet 10 mg  10 mg Oral q1800 Audery Amel, MD   10 mg at 09/12/15 1706  . clonazePAM (KLONOPIN) tablet 1 mg  1 mg Oral QHS Audery Amel, MD   1 mg at 09/11/15 2132  . finasteride (PROSCAR) tablet 5 mg  5 mg Oral Daily Audery Amel, MD   5 mg at 09/12/15 1343  . insulin aspart (novoLOG) injection 0-15 Units  0-15 Units Subcutaneous TID WC Darliss Ridgel, MD   15 Units at 09/12/15 1704  . insulin aspart (novoLOG) injection 0-5 Units  0-5 Units Subcutaneous QHS Darliss Ridgel, MD   2 Units at 09/11/15 2135  . insulin aspart (novoLOG) injection 4 Units  4 Units Subcutaneous TID WC Darliss Ridgel, MD   4 Units at 09/12/15 1705  . insulin glargine (LANTUS) injection 15 Units  15 Units Subcutaneous  QHS Darliss Ridgel, MD   15 Units at 09/11/15 2136  . ketorolac (TORADOL) 30 MG/ML injection 30 mg  30 mg Intravenous Once Audery Amel, MD   30 mg at 09/10/15 1715  . lisinopril (PRINIVIL,ZESTRIL) tablet 5 mg  5 mg Oral Daily Audery Amel, MD   5 mg at 09/12/15 1308  . magnesium hydroxide (MILK OF MAGNESIA) suspension 30 mL  30 mL Oral Daily PRN Audery Amel, MD      . methylphenidate (RITALIN) tablet 5 mg  5 mg Oral BH-q7a Audery Amel, MD   5 mg at 09/11/15 0800  . midodrine (PROAMATINE) tablet 5 mg  5 mg Oral TID WC Demetries Coia T  Cleve Paolillo, MD   5 mg at 09/12/15 1706  . mirtazapine (REMERON) tablet 45 mg  45 mg Oral QHS Audery Amel, MD   45 mg at 09/11/15 2132  . OLANZapine (ZYPREXA) tablet 30 mg  30 mg Oral QHS Audery Amel, MD   30 mg at 09/11/15 2145  . polyethylene glycol (MIRALAX / GLYCOLAX) packet 17 g  17 g Oral Daily Audery Amel, MD   17 g at 09/11/15 0918  . tamsulosin (FLOMAX) capsule 0.4 mg  0.4 mg Oral QPC supper Audery Amel, MD   0.4 mg at 09/12/15 1706  . traZODone (DESYREL) tablet 25 mg  25 mg Oral QHS Audery Amel, MD   25 mg at 09/11/15 2145  . tuberculin injection 5 Units  5 Units Intradermal Once Audery Amel, MD        Lab Results:  Results for orders placed or performed during the hospital encounter of 09/05/15 (from the past 48 hour(s))  Glucose, capillary     Status: Abnormal   Collection Time: 09/10/15  8:56 PM  Result Value Ref Range   Glucose-Capillary 263 (H) 65 - 99 mg/dL   Comment 1 Notify RN    Comment 2 Document in Chart   Glucose, capillary     Status: Abnormal   Collection Time: 09/11/15  6:42 AM  Result Value Ref Range   Glucose-Capillary 124 (H) 65 - 99 mg/dL   Comment 1 Notify RN   Glucose, capillary     Status: Abnormal   Collection Time: 09/11/15 12:22 PM  Result Value Ref Range   Glucose-Capillary 225 (H) 65 - 99 mg/dL  Glucose, capillary     Status: Abnormal   Collection Time: 09/11/15  4:59 PM  Result Value Ref Range    Glucose-Capillary 149 (H) 65 - 99 mg/dL  Glucose, capillary     Status: Abnormal   Collection Time: 09/11/15  8:42 PM  Result Value Ref Range   Glucose-Capillary 138 (H) 65 - 99 mg/dL  Glucose, capillary     Status: Abnormal   Collection Time: 09/12/15  7:08 AM  Result Value Ref Range   Glucose-Capillary 124 (H) 65 - 99 mg/dL  Glucose, capillary     Status: Abnormal   Collection Time: 09/12/15  1:24 PM  Result Value Ref Range   Glucose-Capillary 208 (H) 65 - 99 mg/dL  Glucose, capillary     Status: Abnormal   Collection Time: 09/12/15  4:54 PM  Result Value Ref Range   Glucose-Capillary 359 (H) 65 - 99 mg/dL    Physical Findings: AIMS: Facial and Oral Movements Muscles of Facial Expression: None, normal Lips and Perioral Area: None, normal Jaw: None, normal Tongue: None, normal,Extremity Movements Upper (arms, wrists, hands, fingers): None, normal Lower (legs, knees, ankles, toes): None, normal, Trunk Movements Neck, shoulders, hips: None, normal, Overall Severity Severity of abnormal movements (highest score from questions above): None, normal Incapacitation due to abnormal movements: None, normal Patient's awareness of abnormal movements (rate only patient's report): No Awareness, Dental Status Current problems with teeth and/or dentures?: No Does patient usually wear dentures?: No  CIWA:    COWS:     Musculoskeletal: Strength & Muscle Tone: decreased Gait & Station: normal Patient leans: N/A  Psychiatric Specialty Exam: Review of Systems  Constitutional: Negative.   HENT: Negative.   Eyes: Negative.   Respiratory: Negative.   Cardiovascular: Negative.   Gastrointestinal: Negative.   Musculoskeletal: Negative.   Skin: Negative.   Neurological:  Negative.   Psychiatric/Behavioral: Positive for memory loss. Negative for depression, suicidal ideas, hallucinations and substance abuse. The patient is not nervous/anxious and does not have insomnia.     Blood pressure  128/91, pulse 102, temperature 98.2 F (36.8 C), temperature source Tympanic, resp. rate 15, height 6' (1.829 m), weight 96.163 kg (212 lb), SpO2 99 %.Body mass index is 28.75 kg/(m^2).  General Appearance: Casual  Eye Contact::  Fair  Speech:  Slow  Volume:  Decreased  Mood:  Dysphoric  Affect:  Depressed  Thought Process:  Tangential  Orientation:  Full (Time, Place, and Person)  Thought Content:  Negative  Suicidal Thoughts:  No  Homicidal Thoughts:  No  Memory:  Immediate;   Good Recent;   Fair Remote;   Fair  Judgement:  Impaired  Insight:  Shallow  Psychomotor Activity:  Decreased  Concentration:  Poor  Recall:  [employer] of Knowledge:Good  Language: Fair  Akathisia:  No  Handed:  Right  AIMS (if indicated):     Assets:  Desire for Improvement Financial Resources/Insurance Housing Intimacy Leisure Time Resilience Social Support  ADL's:  Intact  Cognition: Impaired,  Mild  Sleep:  Number of Hours: 7.45   Treatment Plan Summary: Daily contact with patient to assess and evaluate symptoms and progress in treatment, Medication management and Plan Patient had ECT this morning bilateral tolerated treatment well. I'm hoping that getting in as many treatments as we can really without causing cognitive impairment before discharge will help him to maintain function outside the hospital. Patient continues to be very withdrawn and require a lot of assistance in activity on the unit. He also continues to have some fragility to his blood pressure and pulse and blood sugars. Supportive counseling with patient. Did do ECT today. Do not anticipated Monday but likely Wednesday. We are hoping for discharge if we can identify appropriate rehabilitation by early next week. No other change to treatment plan today. Additionally, I note that his blood pressure is under better control. Not having the low dips in blood pressure. Sugars are looking under better control as well. Patient continues on  medicine for his recent pulmonary embolism. Krishana Lutze 09/12/2015, 5:15 PM

## 2015-09-12 NOTE — [facility] Group Notes (Signed)
[facility] LCSW Aftercare Discharge Planning Group Note   09/12/2015 10:53 AM  Participation Quality:   Patient did not attend group.   Lena Fieldhouse T, MSW, LCSWA   

## 2015-09-12 NOTE — Tx Team (Signed)
Interdisciplinary Treatment Plan Update (Adult)  Date:  09/12/2015 Time Reviewed:  3:22 PM  Progress in Treatment: Attending groups: No. Participating in groups:  No. Taking medication as prescribed:  Yes. Tolerating medication:  Yes. Family/Significant othe contact made:  Yes, individual(s) contacted:  Nadine Porrata Patient understands diagnosis:  Yes. Discussing patient identified problems/goals with staff:  Yes. Medical problems stabilized or resolved:  Yes. Denies suicidal/homicidal ideation: Yes. Issues/concerns per patient self-inventory:  Yes. Other:  New problem(s) identified: No, Describe:  Na  Discharge Plan or Barriers: Pt plans to go to a SNF   Reason for Continuation of Hospitalization: Depression Hallucinations Medical Issues Medication stabilization Suicidal ideation  Comments:Patient with severe major depression that has responded well to ECT. Patient says his mood is "about the same". He means that he is mild to moderately depressed but does not have suicidal thoughts. He has been participating a little bit with physical therapy but otherwise stays mostly to himself. Chronic fatigue. No other specific new problems. He still gets orthostatic at times but his baseline blood pressure is under pretty good control. Still working on getting his sugars under control.   Estimated length of stay: 7 days   New goal(s):  Review of initial/current patient goals per problem list:   1.  Goal(s): Patient will participate in aftercare plan * Met:  * Target date: at discharge * As evidenced by: Patient will participate within aftercare plan AEB aftercare provider and housing plan at discharge being identified.   2.  Goal (s): Patient will exhibit decreased depressive symptoms and suicidal ideations. * Met:  *  Target date: at discharge * As evidenced by: Patient will utilize self rating of depression at 3 or below and demonstrate decreased signs of depression or be deemed  stable for discharge by MD.   3.  Goal(s): Patient will demonstrate decreased signs and symptoms of anxiety. * Met:  * Target date: at discharge * As evidenced by: Patient will utilize self rating of anxiety at 3 or below and demonstrated decreased signs of anxiety, or be deemed stable for discharge by MD  4.  Goal (s): Patient will demonstrate decreased symptoms of psychosis. * Met: No  *  Target date: at discharge * As evidenced by: Patient will not endorse signs of psychosis or be deemed stable for discharge by MD.   Attendees: Patient:  Christian [name] 10/14/20163:22 PM  Family:   10/14/20163:22 PM  Physician:  Dr. Weber Cooks 10/14/20163:22 PM  Nursing:   Anderson Malta, RN  10/14/20163:22 PM  Case Manager:   10/14/20163:22 PM  Counselor:   10/14/20163:22 PM  Other:  [place]  10/14/20163:22 PM  Other:  Everitt Amber, Hand  10/14/20163:22 PM  Other:   10/14/20163:22 PM  Other:  10/14/20163:22 PM  Other:  10/14/20163:22 PM  Other:  10/14/20163:22 PM  Other:  10/14/20163:22 PM  Other:  10/14/20163:22 PM  Other:  10/14/20163:22 PM  Other:   10/14/20163:22 PM   Scribe for Treatment Team:   Jenavieve Freda L [place] Magnan,MSW, [place]  09/12/2015, 3:22 PM

## 2015-09-13 DIAGNOSIS — F315 Bipolar disorder, current episode depressed, severe, with psychotic features: Secondary | ICD-10-CM

## 2015-09-13 LAB — GLUCOSE, CAPILLARY
Glucose-Capillary: 171 mg/dL — ABNORMAL HIGH (ref 65–99)
Glucose-Capillary: 170 mg/dL — ABNORMAL HIGH (ref 65–99)
Glucose-Capillary: 115 mg/dL — ABNORMAL HIGH (ref 65–99)
Glucose-Capillary: 112 mg/dL — ABNORMAL HIGH (ref 65–99)

## 2015-09-13 MED ORDER — COLCHICINE 0.6 MG PO TABS
0.6000 mg | ORAL_TABLET | Freq: Two times a day (BID) | ORAL | Status: DC
  Administered 2015-09-13 – 2015-09-18 (×11): 0.6 mg via ORAL
  Filled 2015-09-13 (×11): qty 1

## 2015-09-13 NOTE — Progress Notes (Addendum)
[facility] MD Progress Note  09/13/2015 11:23 AM Christian [name]  MRN:  [id number] Subjective: Patient was seen lying in bed late in the morning. He denies having major issues with mood, appetite, energy or concentration. He denies having suicidal ideation, homicidal ideation or auditory or visual hallucinations. The patient denies having any side effects from his medications. He does complain of having a gout attack and is requesting to be started on colchicine.  Patient continues to receive ECT for severe depression-psychosis and inability to function  Per nursing: Patient tolerated ECT.His mood is still depressed,rated his depression 5/10.Denies suicidal ideation and hallucination.Stayed in room most of the time.Minimal interaction with staff & peers.Still uses wheelchair.Compliant with medications.         Principal Problem: Bipolar disorder, now depressed [facility]) Diagnosis:   Patient Active Problem List   Diagnosis Date Noted  . Overdose [T50.901A] 09/11/2015  . Acute respiratory failure with hypoxia (HCC) [J96.01] 09/05/2015  . Elevated troponin [R79.89] 09/05/2015  . Somnolence [R40.0] 09/05/2015  . Bipolar depression (HCC) [F31.30] 09/05/2015  . Acute bilateral deep vein thrombosis (DVT) of femoral veins (HCC) [I82.413] 09/05/2015  . Bilateral pulmonary embolism (HCC) [I26.99] 09/02/2015  . Labile hypertension [I10]   . Pulmonary embolism with acute cor pulmonale (HCC) [I26.09]   . Dyspnea [R06.00]   . Orthostatic hypotension [I95.1]   . Bipolar disorder, in partial remission, most recent episode depressed (HCC) [F31.75]   . Syncope, near [R55] 08/31/2015  . Affective psychosis, bipolar (HCC) [F31.9]   . Bipolar disorder, now depressed (HCC) [F31.30] 08/28/2015  . Bipolar I disorder, severe, current or most recent episode depressed, with psychotic features (HCC) [F31.5]   . Pressure ulcer [L89.90] 07/30/2015  . Protein-calorie malnutrition, severe (HCC) [E43] 07/30/2015  .  Dehydration [E86.0] 07/29/2015  . Bipolar disorder, current episode depressed, severe, with psychotic features (HCC) [F31.5] 07/09/2015  . Diabetes (HCC) [E11.9] 07/09/2015  . UTI (urinary tract infection) [N39.0] 06/19/2014  . Positive blood culture [R78.81] 06/19/2014  . Bipolar affective disorder, current episode manic with psychotic symptoms (HCC) [F31.2] 06/17/2014  . Type II or unspecified type diabetes mellitus with unspecified complication, uncontrolled [E11.8, E11.65] 06/02/2014  . Other and unspecified hyperlipidemia [E78.5] 06/02/2014  . OSA on CPAP [G47.33] 06/02/2014  . 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 Gout [M10.9] 06/02/2014  . Hypertension [I10]    Total Time spent with patient: 30 minutes  Past Psychiatric History: Long-standing history of bipolar disorder currently a long severe depression with psychotic features responding to ECT. Patient has had difficulty responding which has been getting worse with time as he has become more chronically impaired. Multiple suicide attempts in the past. Multiple hospitalizations.  Past Medical History:  Past Medical History  Diagnosis Date  . Mood swings (HCC)   . Type II or unspecified type diabetes mellitus with unspecified complication, uncontrolled 06/02/2014  . Other and unspecified hyperlipidemia 06/02/2014  . OSA on CPAP 06/02/2014  . 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 Bipolar disorder (HCC)   . Hypertension   . Gout   . Bipolar 1 disorder (HCC)    History reviewed. No pertinent past surgical history. Family History:  Family History  Problem Relation Age of Onset  . Dementia Mother   . Arthritis Father   . Heart failure Neg Hx   . Kidney failure Neg Hx   . Cancer Neg Hx    Family Psychiatric  History: Positive family history  of depression and possibly bipolar disorder Social History:  History  Alcohol Use No     History   Drug Use No    Social History   Social History  . Marital Status: Married    Spouse Name: N/A  . Number of Children: N/A  . Years of Education: N/A   Social History Main Topics  . Smoking status: Never Smoker   . Smokeless tobacco: None  . Alcohol Use: No  . Drug Use: No  . Sexual Activity: Not Currently   Other Topics Concern  . None   Social History Narrative   *The patient has been married for over 30 years and is currently on disability. He lives with his wife in the [place] area and has one daughter.              Sleep: Good  Appetite:  Good  Current Medications: Current Facility-Administered Medications  Medication Dose Route Frequency Provider Last Rate Last Dose  . 0.9 %  sodium chloride infusion  250 mL Intravenous Once Audery Amel, MD      . acetaminophen (TYLENOL) tablet 650 mg  650 mg Oral Q6H PRN Audery Amel, MD   650 mg at 09/09/15 2215  . allopurinol (ZYLOPRIM) tablet 300 mg  300 mg Oral Daily Audery Amel, MD   300 mg at 09/13/15 0901  . alum & mag hydroxide-simeth (MAALOX/MYLANTA) 200-200-20 MG/5ML suspension 30 mL  30 mL Oral Q4H PRN Audery Amel, MD      . ammonium lactate (LAC-HYDRIN) 12 % lotion   Topical PRN Audery Amel, MD      . apixaban (ELIQUIS) tablet 5 mg  5 mg Oral BID Audery Amel, MD   5 mg at 09/13/15 0901  . aspirin EC tablet 81 mg  81 mg Oral Daily Audery Amel, MD   81 mg at 09/13/15 0901  . atorvastatin (LIPITOR) tablet 10 mg  10 mg Oral q1800 Audery Amel, MD   10 mg at 09/12/15 1706  . clonazePAM (KLONOPIN) tablet 1 mg  1 mg Oral QHS Audery Amel, MD   1 mg at 09/12/15 2101  . colchicine tablet 0.6 mg  0.6 mg Oral BID Jimmy Footman, MD      . finasteride (PROSCAR) tablet 5 mg  5 mg Oral Daily Audery Amel, MD   5 mg at 09/13/15 0901  . insulin aspart (novoLOG) injection 0-15 Units  0-15 Units Subcutaneous TID WC Darliss Ridgel, MD   3 Units at 09/13/15 [phone]  . insulin aspart (novoLOG) injection 0-5  Units  0-5 Units Subcutaneous QHS Darliss Ridgel, MD   3 Units at 09/12/15 2110  . insulin aspart (novoLOG) injection 4 Units  4 Units Subcutaneous TID WC Darliss Ridgel, MD   4 Units at 09/13/15 0857  . insulin glargine (LANTUS) injection 15 Units  15 Units Subcutaneous QHS Darliss Ridgel, MD   15 Units at 09/12/15 2103  . ketorolac (TORADOL) 30 MG/ML injection 30 mg  30 mg Intravenous Once Audery Amel, MD   30 mg at 09/10/15 1715  . lisinopril (PRINIVIL,ZESTRIL) tablet 5 mg  5 mg Oral Daily Audery Amel, MD   5 mg at 09/13/15 0901  . magnesium hydroxide (MILK OF MAGNESIA) suspension 30 mL  30 mL Oral Daily PRN Audery Amel, MD      . methylphenidate (RITALIN) tablet 5 mg  5 mg Oral BH-q7a John T Clapacs,  MD   5 mg at 09/13/15 0656  . midodrine (PROAMATINE) tablet 5 mg  5 mg Oral TID WC Audery Amel, MD   5 mg at 09/13/15 0859  . mirtazapine (REMERON) tablet 45 mg  45 mg Oral QHS Audery Amel, MD   45 mg at 09/12/15 2101  . OLANZapine (ZYPREXA) tablet 30 mg  30 mg Oral QHS Audery Amel, MD   30 mg at 09/12/15 2101  . polyethylene glycol (MIRALAX / GLYCOLAX) packet 17 g  17 g Oral Daily Audery Amel, MD   17 g at 09/11/15 0918  . tamsulosin (FLOMAX) capsule 0.4 mg  0.4 mg Oral QPC supper Audery Amel, MD   0.4 mg at 09/12/15 1706  . traZODone (DESYREL) tablet 25 mg  25 mg Oral QHS Audery Amel, MD   25 mg at 09/12/15 2102  . tuberculin injection 5 Units  5 Units Intradermal Once Audery Amel, MD   5 Units at 09/12/15 2040    Lab Results:  Results for orders placed or performed during the hospital encounter of 09/05/15 (from the past 48 hour(s))  Glucose, capillary     Status: Abnormal   Collection Time: 09/11/15 12:22 PM  Result Value Ref Range   Glucose-Capillary 225 (H) 65 - 99 mg/dL  Glucose, capillary     Status: Abnormal   Collection Time: 09/11/15  4:59 PM  Result Value Ref Range   Glucose-Capillary 149 (H) 65 - 99 mg/dL  Glucose, capillary     Status: Abnormal    Collection Time: 09/11/15  8:42 PM  Result Value Ref Range   Glucose-Capillary 138 (H) 65 - 99 mg/dL  Glucose, capillary     Status: Abnormal   Collection Time: 09/12/15  7:08 AM  Result Value Ref Range   Glucose-Capillary 124 (H) 65 - 99 mg/dL  Glucose, capillary     Status: Abnormal   Collection Time: 09/12/15  1:24 PM  Result Value Ref Range   Glucose-Capillary 208 (H) 65 - 99 mg/dL  Glucose, capillary     Status: Abnormal   Collection Time: 09/12/15  4:54 PM  Result Value Ref Range   Glucose-Capillary 359 (H) 65 - 99 mg/dL  Glucose, capillary     Status: Abnormal   Collection Time: 09/12/15  8:52 PM  Result Value Ref Range   Glucose-Capillary 173 (H) 65 - 99 mg/dL  Glucose, capillary     Status: Abnormal   Collection Time: 09/13/15  6:53 AM  Result Value Ref Range   Glucose-Capillary 171 (H) 65 - 99 mg/dL   Comment 1 Notify RN     Physical Findings: AIMS: Facial and Oral Movements Muscles of Facial Expression: None, normal Lips and Perioral Area: None, normal Jaw: None, normal Tongue: None, normal,Extremity Movements Upper (arms, wrists, hands, fingers): None, normal Lower (legs, knees, ankles, toes): None, normal, Trunk Movements Neck, shoulders, hips: None, normal, Overall Severity Severity of abnormal movements (highest score from questions above): None, normal Incapacitation due to abnormal movements: None, normal Patient's awareness of abnormal movements (rate only patient's report): No Awareness, Dental Status Current problems with teeth and/or dentures?: No Does patient usually wear dentures?: No  CIWA:    COWS:     Musculoskeletal: Strength & Muscle Tone: decreased Gait & Station: normal Patient leans: N/A  Psychiatric Specialty Exam: Review of Systems  Constitutional: Negative.   HENT: Negative.   Eyes: Negative.   Respiratory: Negative.   Cardiovascular: Negative.   Gastrointestinal: Negative.  Musculoskeletal: Positive for joint pain.  Skin:  Negative.   Neurological: Negative.   Psychiatric/Behavioral: Positive for memory loss. Negative for depression, suicidal ideas, hallucinations and substance abuse. The patient is not nervous/anxious and does not have insomnia.     Blood pressure 132/86, pulse 111, temperature 98.2 F (36.8 C), temperature source Oral, resp. rate 20, height 6' (1.829 m), weight 96.163 kg (212 lb), SpO2 99 %.Body mass index is 28.75 kg/(m^2).  General Appearance: Casual  Eye Contact::  Fair  Speech:  Normal Rate  Volume:  Decreased  Mood:  Dysphoric  Affect:  Congruent  Thought Process:  Linear  Orientation:  Full (Time, Place, and Person)  Thought Content:  Negative  Suicidal Thoughts:  No  Homicidal Thoughts:  No  Memory:  Immediate;   Good Recent;   Fair Remote;   Fair  Judgement:  Impaired  Insight:  Shallow  Psychomotor Activity:  Decreased  Concentration:  Poor  Recall:  [employer] of Knowledge:Good  Language: Fair  Akathisia:  No  Handed:  Right  AIMS (if indicated):     Assets:  Desire for Improvement Financial Resources/Insurance Housing Intimacy Leisure Time Resilience Social Support  ADL's:  Intact  Cognition: Impaired,  Mild  Sleep:  Number of Hours: 6.3   Treatment Plan Summary: Daily contact with patient to assess and evaluate symptoms and progress in treatment, Medication management and Plan Patient had ECT this morning bilateral tolerated treatment well. I'm hoping that getting in as many treatments as we can really without causing cognitive impairment before discharge will help him to maintain function outside the hospital. Patient continues to be very withdrawn and require a lot of assistance in activity on the unit. He also continues to have some fragility to his blood pressure and pulse and blood sugars. Supportive counseling with patient. Did do ECT today. Do not anticipated Monday but likely Wednesday. We are hoping for discharge if we can identify appropriate  rehabilitation by early next week. No other change to treatment plan today.   Bipolar disorder: Continue olanzapine 30 mg by mouth daily at bedtime and mirtazapine 45 mg by mouth daily at bedtime.  Continue Ritalin in order to address psychomotor retardation.  Continue ECT  Insomnia: Continue trazodone 25 mg daily at bedtime  Anxiety: Continue Klonopin  Diabetes: Continue Lantus by mouth daily at bedtime and NovoLog 4 units along with supplemental insulin  Hypertension: Continue lisinopril  Cardiovascular health: Continue aspirin daily  Dyslipidemia: Continue Lipitor  Recent pulmonary embolism: Continue eliquis  Gout: Continue allopurinol and today I will start him on colchicine  BPH: Continue Proscar by mouth daily  No major changes will be made to his medication regimen. Today I will be adding colchicine for gout.  Jimmy Footman 09/13/2015, 11:23 AM

## 2015-09-13 NOTE — Plan of Care (Signed)
Problem: Alteration in mood Goal: STG-Patient is able to discuss feelings and issues (Patient is able to discuss feelings and issues leading to depression)  Outcome: Progressing Patient is able to express his feelings and concerns.

## 2015-09-13 NOTE — [facility] Group Notes (Signed)
[facility] Group Notes:  (Nursing/MHT/Case Management/Adjunct)  Date:  09/13/2015  Time:  6:34 PM  Type of Therapy:  Group Therapy  Participation Level:  Did Not Attend  Participation Quality:  n/a  Affect:  n/a  Cognitive:  n/a  Insight:  None  Engagement in Group:  None  Modes of Intervention:  n/a  Summary of Progress/Problems:  Jocelyn Lamer 09/13/2015, 6:34 PM

## 2015-09-13 NOTE — [facility] Group Notes (Signed)
[facility] LCSW Group Therapy  09/13/2015 11:08 AM  Type of Therapy:  Group Therapy  Participation Level:  Did Not Attend  Modes of Intervention:  Discussion, Education, Socialization and Support  Summary of Progress/Problems: Balance in life: Patients will discuss the concept of balance and how it looks and feels to be unbalanced. Pt will identify areas in their life that is unbalanced and ways to become more balanced.    Saadiq Poche L Astella Desir MSW, LCSWA  09/13/2015, 11:08 AM  

## 2015-09-13 NOTE — Progress Notes (Signed)
D: Pt denies SI/HI/AV. Pt is pleasant and cooperative. Pt goal for today is to attend group and developing new coping skills A: Pt was offered support and encouragement. Pt was given scheduled medications. Pt was encourage to attend groups. Q 15 minute checks were done for safety.  R:Pt attends groups and interacts well with peers and staff. Pt is taking medication. Pt has no complaints.Pt receptive to treatment and safety maintained on unit.

## 2015-09-13 NOTE — Progress Notes (Signed)
ANTICOAGULATION CONSULT NOTE - Follow up Consult  Pharmacy Consult for Apixaban Indication: pulmonary embolus  No Known Allergies  Patient Measurements: Height: 6' (182.9 cm) Weight: 212 lb (96.163 kg) IBW/kg (Calculated) : 77.6 Heparin Dosing Weight:   Vital Signs: Temp: 98.2 F (36.8 C) (10/15 0707) Temp Source: Oral (10/15 0707) BP: 132/86 mmHg (10/15 0707) Pulse Rate: 111 (10/15 0707)  Labs: No results for input(s): HGB, HCT, PLT, APTT, LABPROT, INR, HEPARINUNFRC, CREATININE, CKTOTAL, CKMB, TROPONINI in the last 72 hours.  Estimated Creatinine Clearance: 86.9 mL/min (by C-G formula based on Cr of 1.06).   Medical History: Past Medical History  Diagnosis Date  . Mood swings (HCC)   . Type II or unspecified type diabetes mellitus with unspecified complication, uncontrolled 06/02/2014  . Other and unspecified hyperlipidemia 06/02/2014  . OSA on CPAP 06/02/2014  . 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 Bipolar disorder (HCC)   . Hypertension   . Gout   . Bipolar 1 disorder (HCC)     Medications:  Prescriptions prior to admission  Medication Sig Dispense Refill Last Dose  . allopurinol (ZYLOPRIM) 300 MG tablet Take 1 tablet (300 mg total) by mouth daily. For gout   Past Month at Unknown time  . apixaban (ELIQUIS) 5 MG TABS tablet Take 2 tablets (10 mg total) by mouth 2 (two) times daily. 28 tablet 0   . apixaban (ELIQUIS) 5 MG TABS tablet Take 1 tablet (5 mg total) by mouth 2 (two) times daily. 60 tablet 6   . aspirin EC 81 MG tablet Take 81 mg by mouth daily.   Past Month at Unknown time  . atorvastatin (LIPITOR) 10 MG tablet Take 1 tablet (10 mg total) by mouth daily. For high cholesterol   Past Month at Unknown time  . cholecalciferol (VITAMIN D) 400 UNITS TABS tablet Take 800 Units by mouth daily.   Past Month at Unknown time  . clonazePAM (KLONOPIN) 1 MG tablet Take 1 tablet (1 mg total) by mouth at bedtime. 30 tablet 0   .  docusate sodium (COLACE) 100 MG capsule Take 2 capsules (200 mg total) by mouth 2 (two) times daily. 10 capsule 0   . feeding supplement, GLUCERNA SHAKE, (GLUCERNA SHAKE) LIQD Take 237 mLs by mouth 3 (three) times daily with meals.  0   . finasteride (PROSCAR) 5 MG tablet Take 1 tablet (5 mg total) by mouth daily. 1 tablet 0   . insulin aspart (NOVOLOG) 100 UNIT/ML injection Inject 0-9 Units into the skin 3 (three) times daily with meals. 10 mL 11   . insulin aspart (NOVOLOG) 100 UNIT/ML injection Inject 5 Units into the skin 3 (three) times daily with meals. 10 mL 11   . insulin aspart (NOVOLOG) 100 UNIT/ML injection Inject 0-9 Units into the skin 3 (three) times daily with meals. 10 mL 11   . insulin glargine (LANTUS) 100 UNIT/ML injection Inject 0.12 mLs (12 Units total) into the skin at bedtime. 10 mL 11   . levofloxacin (LEVAQUIN) 750 MG tablet Take 1 tablet (750 mg total) by mouth daily. Please continue for 3 more days to complete course 3 tablet 0   . midodrine (PROAMATINE) 5 MG tablet Take 1 tablet (5 mg total) by mouth 3 (three) times daily with meals. 90 tablet 6   . mirtazapine (REMERON) 30 MG tablet Take 30 mg by mouth at bedtime.   07/24/2015 at Unknown time  . OLANZapine zydis (ZYPREXA) 15 MG disintegrating tablet  Take 2 tablets (30 mg total) by mouth at bedtime. 1 tablet 0   . ondansetron (ZOFRAN) 4 MG tablet Take 1 tablet (4 mg total) by mouth every 6 (six) hours as needed for nausea. 20 tablet 0   . polyethylene glycol (MIRALAX / GLYCOLAX) packet Take 17 g by mouth daily. 14 each 0   . tamsulosin (FLOMAX) 0.4 MG CAPS capsule Take 1 capsule (0.4 mg total) by mouth at bedtime. 30 capsule 9   . traZODone (DESYREL) 50 MG tablet Take 0.5 tablets (25 mg total) by mouth 3 (three) times daily. (Patient taking differently: Take 200 mg by mouth at bedtime. )       Assessment: CrCl = 111.7 ml/min Pt was started on Apixaban 10 mg PO BID X 7 on 10/4.  Pt was d/c'd on 10/7 AM but has returned  this evening 10/7 for bipolar disorder.  Goal of Therapy:  Resolution of PE    Plan:  Eliquis 10 mg PO BID X 7D started on 10/4.  Will continue with Eliquis 10 mg PO BID for 7 more doses (through 10/10). Will start Eliquis  PO BID on 10/11.   10/10: Patient on Apixaban  bid for PE with transition to 5 mg bid tomorrow 10/11. F/U labs.  10/15: Apixaban 5 mg bid. Will order Scr/CBC with am labs.  Bari Mantis PharmD Clinical Pharmacist 09/13/2015  10:28 AM

## 2015-09-14 LAB — GLUCOSE, CAPILLARY
Glucose-Capillary: 103 mg/dL — ABNORMAL HIGH (ref 65–99)
Glucose-Capillary: 221 mg/dL — ABNORMAL HIGH (ref 65–99)
Glucose-Capillary: 178 mg/dL — ABNORMAL HIGH (ref 65–99)
Glucose-Capillary: 216 mg/dL — ABNORMAL HIGH (ref 65–99)

## 2015-09-14 LAB — CBC
HCT: 38.3 % — ABNORMAL LOW (ref 40.0–52.0)
Hemoglobin: 12.8 g/dL — ABNORMAL LOW (ref 13.0–18.0)
MCH: 30.2 pg (ref 26.0–34.0)
MCHC: 33.5 g/dL (ref 32.0–36.0)
MCV: 90.2 fL (ref 80.0–100.0)
Platelets: 212 10*3/uL (ref 150–440)
RBC: 4.24 MIL/uL — ABNORMAL LOW (ref 4.40–5.90)
RDW: 13.3 % (ref 11.5–14.5)
WBC: 3.8 10*3/uL (ref 3.8–10.6)

## 2015-09-14 LAB — CREATININE, SERUM
Creatinine, Ser: 0.96 mg/dL (ref 0.61–1.24)
GFR calc Af Amer: >60 mL/min (ref >60)
GFR calc non Af Amer: >60 mL/min (ref >60)

## 2015-09-14 NOTE — [facility] Group Notes (Signed)
[facility] LCSW Group Therapy  09/14/2015 11:47 AM  Type of Therapy:  Group Therapy  Participation Level:  Did Not Attend  Modes of Intervention:  Discussion, Education, Socialization and Support  Summary of Progress/Problems: Pt will identify unhealthy thoughts and how they impact their emotions and behavior. Pt will be encouraged to discuss these thoughts, emotions and behaviors with the group.   Shaima Sardinas L Kael Keetch MSW, LCSWA  09/14/2015, 11:47 AM

## 2015-09-14 NOTE — [facility] Suicide Risk Assessment (Signed)
[facility] INPATIENT:  Family/Significant Other Suicide Prevention Education  Suicide Prevention Education:  Education Completed; Roddrick Rutigliano, [phone] has been identified by the patient as the family member/significant other with whom the patient will be residing, and identified as the person(s) who will aid the patient in the event of a mental health crisis (suicidal ideations/suicide attempt).  With written consent from the patient, the family member/significant other has been provided the following suicide prevention education, prior to the and/or following the discharge of the patient.  The suicide prevention education provided includes the following:  Suicide risk factors  Suicide prevention and interventions  National Suicide Hotline telephone number  [facility] assessment telephone number  [facility] Emergency Assistance 911  [facility] and/or Residential Mobile Crisis Unit telephone number  Request made of family/significant other to:  Remove weapons (e.g., guns, rifles, knives), all items previously/currently identified as safety concern.    Remove drugs/medications (over-the-counter, prescriptions, illicit drugs), all items previously/currently identified as a safety concern.  The family member/significant other verbalizes understanding of the suicide prevention education information provided.  The family member/significant other agrees to remove the items of safety concern listed above.  Barbie Croston L Valisa Karpel MSW, LCSWA  09/14/2015, 11:40 AM

## 2015-09-14 NOTE — Progress Notes (Signed)
ANTICOAGULATION CONSULT NOTE - Follow up Consult  Pharmacy Consult for Apixaban Indication: pulmonary embolus  No Known Allergies  Patient Measurements: Height: 6' (182.9 cm) Weight: 212 lb (96.163 kg) IBW/kg (Calculated) : 77.6 Heparin Dosing Weight:   Vital Signs: Temp: 97.5 F (36.4 C) (10/16 0700) BP: 133/85 mmHg (10/16 0700) Pulse Rate: 101 (10/16 0700)  Labs:  Recent Labs  09/14/15 0713  HGB 12.8*  HCT 38.3*  PLT 212  CREATININE 0.96    Estimated Creatinine Clearance: 95.9 mL/min (by C-G formula based on Cr of 0.96).   Medical History: Past Medical History  Diagnosis Date  . Mood swings (HCC)   . Type II or unspecified type diabetes mellitus with unspecified complication, uncontrolled 06/02/2014  . Other and unspecified hyperlipidemia 06/02/2014  . OSA on CPAP 06/02/2014  . 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 Bipolar disorder (HCC)   . Hypertension   . Gout   . Bipolar 1 disorder (HCC)     Medications:  Prescriptions prior to admission  Medication Sig Dispense Refill Last Dose  . allopurinol (ZYLOPRIM) 300 MG tablet Take 1 tablet (300 mg total) by mouth daily. For gout   Past Month at Unknown time  . apixaban (ELIQUIS) 5 MG TABS tablet Take 2 tablets (10 mg total) by mouth 2 (two) times daily. 28 tablet 0   . apixaban (ELIQUIS) 5 MG TABS tablet Take 1 tablet (5 mg total) by mouth 2 (two) times daily. 60 tablet 6   . aspirin EC 81 MG tablet Take 81 mg by mouth daily.   Past Month at Unknown time  . atorvastatin (LIPITOR) 10 MG tablet Take 1 tablet (10 mg total) by mouth daily. For high cholesterol   Past Month at Unknown time  . cholecalciferol (VITAMIN D) 400 UNITS TABS tablet Take 800 Units by mouth daily.   Past Month at Unknown time  . clonazePAM (KLONOPIN) 1 MG tablet Take 1 tablet (1 mg total) by mouth at bedtime. 30 tablet 0   . docusate sodium (COLACE) 100 MG capsule Take 2 capsules (200 mg total) by mouth 2  (two) times daily. 10 capsule 0   . feeding supplement, GLUCERNA SHAKE, (GLUCERNA SHAKE) LIQD Take 237 mLs by mouth 3 (three) times daily with meals.  0   . finasteride (PROSCAR) 5 MG tablet Take 1 tablet (5 mg total) by mouth daily. 1 tablet 0   . insulin aspart (NOVOLOG) 100 UNIT/ML injection Inject 0-9 Units into the skin 3 (three) times daily with meals. 10 mL 11   . insulin aspart (NOVOLOG) 100 UNIT/ML injection Inject 5 Units into the skin 3 (three) times daily with meals. 10 mL 11   . insulin aspart (NOVOLOG) 100 UNIT/ML injection Inject 0-9 Units into the skin 3 (three) times daily with meals. 10 mL 11   . insulin glargine (LANTUS) 100 UNIT/ML injection Inject 0.12 mLs (12 Units total) into the skin at bedtime. 10 mL 11   . levofloxacin (LEVAQUIN) 750 MG tablet Take 1 tablet (750 mg total) by mouth daily. Please continue for 3 more days to complete course 3 tablet 0   . midodrine (PROAMATINE) 5 MG tablet Take 1 tablet (5 mg total) by mouth 3 (three) times daily with meals. 90 tablet 6   . mirtazapine (REMERON) 30 MG tablet Take 30 mg by mouth at bedtime.   07/24/2015 at Unknown time  . OLANZapine zydis (ZYPREXA) 15 MG disintegrating tablet Take 2 tablets (30 mg  total) by mouth at bedtime. 1 tablet 0   . ondansetron (ZOFRAN) 4 MG tablet Take 1 tablet (4 mg total) by mouth every 6 (six) hours as needed for nausea. 20 tablet 0   . polyethylene glycol (MIRALAX / GLYCOLAX) packet Take 17 g by mouth daily. 14 each 0   . tamsulosin (FLOMAX) 0.4 MG CAPS capsule Take 1 capsule (0.4 mg total) by mouth at bedtime. 30 capsule 9   . traZODone (DESYREL) 50 MG tablet Take 0.5 tablets (25 mg total) by mouth 3 (three) times daily. (Patient taking differently: Take 200 mg by mouth at bedtime. )       Assessment: CrCl = 111.7 ml/min Pt was started on Apixaban 10 mg PO BID X 7 on 10/4.  Pt was d/c'd on 10/7 AM but has returned this evening 10/7 for bipolar disorder.  Goal of Therapy:  Resolution of PE     Plan:  Eliquis 10 mg PO BID X 7D started on 10/4.  Will continue with Eliquis 10 mg PO BID for 7 more doses (through 10/10). Will start Eliquis  PO BID on 10/11.   10/10: Patient on Apixaban  bid for PE with transition to 5 mg bid tomorrow 10/11. F/U labs.  10/15: Apixaban 5 mg bid. Will order Scr/CBC with am labs.  10/16: Continue apixaban 5 mg bid.   Bari Mantis PharmD Clinical Pharmacist 09/14/2015  8:19 AM

## 2015-09-14 NOTE — Progress Notes (Signed)
D) Patient pleasant and cooperative upon my assessment. Patient did not complete Self Inventory Assessment and rates depression as  4/10, patient rates hopeless feelings as 3/10.  Patient denies SI/HI, denies A/V hallucinations.  Patient's affect is sad and mood is  depressed.  Reports that his  appetite is good.   A) Patient offered support and encouragement, patient encouraged to discuss feelings/concerns with staff. Patient verbalized understanding. Patient monitored Q15 minutes for safety. Patient met with MD  to discuss today's goals and plan of care.  R) Patient visible in milieu. No groups were held today.  Patient come to dining room for meals and snacks. Patient appropriate with staff and peers.   Patient taking medications as ordered. Will continue to monitor.

## 2015-09-14 NOTE — Progress Notes (Signed)
[facility] MD Progress Note  09/14/2015 8:45 AM Christian [name]  MRN:  [id number] Subjective: Patient was seen lying in bed late in the morning. He denies having major issues with mood, appetite, energy or concentration. He denies having suicidal ideation, homicidal ideation or auditory or visual hallucinations. The patient denies having any side effects from his medications. Patient states that his depression has improved but he continues to feel down and depressed, he feels that the treatment has helped to a certain point that he has failed to progress from there. He c/oof having a gout attack yesterday.  He was started on colchicine. Today he reports the patient is still there but is not as severe.  Patient continues to receive ECT for severe depression-psychosis and inability to function  Per nursing: Darrnell remains sad on approach, educated patient on using walker for ambulation, he verbalized understanding, he was cooperative with treatment, he remains depressed, he had no behavioral issues to report on shift.  Principal Problem: Bipolar disorder, now depressed [facility]) Diagnosis:   Patient Active Problem List   Diagnosis Date Noted  . Overdose [T50.901A] 09/11/2015  . Acute respiratory failure with hypoxia ([place]) [J96.01] 09/05/2015  . Elevated troponin [R79.89] 09/05/2015  . Somnolence [R40.0] 09/05/2015  . Bipolar depression ([place]) [F31.30] 09/05/2015  . Acute bilateral deep vein thrombosis (DVT) of femoral veins (HCC) [I82.413] 09/05/2015  . Bilateral pulmonary embolism ([place]) [I26.99] 09/02/2015  . Labile hypertension [I10]   . Pulmonary embolism with acute cor pulmonale ([place]) [I26.09]   . Dyspnea [R06.00]   . Orthostatic hypotension [I95.1]   . Bipolar disorder, in partial remission, most recent episode depressed ([place]) [F31.75]   . Syncope, near [R55] 08/31/2015  . Affective psychosis, bipolar ([place]) [F31.9]   . Bipolar disorder, now depressed ([place]) [F31.30] 08/28/2015  . Bipolar I  disorder, severe, current or most recent episode depressed, with psychotic features ([place]) [F31.5]   . Pressure ulcer [L89.90] 07/30/2015  . Protein-calorie malnutrition, severe ([place]) [E43] 07/30/2015  . Dehydration [E86.0] 07/29/2015  . Bipolar disorder, current episode depressed, severe, with psychotic features ([place]) [F31.5] 07/09/2015  . Diabetes ([place]) [E11.9] 07/09/2015  . UTI (urinary tract infection) [N39.0] 06/19/2014  . Positive blood culture [R78.81] 06/19/2014  . Bipolar affective disorder, current episode manic with psychotic symptoms ([place]) [F31.2] 06/17/2014  . Type II or unspecified type diabetes mellitus with unspecified complication, uncontrolled [E11.8, E11.65] 06/02/2014  . Other and unspecified hyperlipidemia [E78.5] 06/02/2014  . OSA on CPAP [G47.33] 06/02/2014  . 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 Gout [M10.9] 06/02/2014  . Hypertension [I10]    Total Time spent with patient: 30 minutes  Past Psychiatric History: Long-standing history of bipolar disorder currently a long severe depression with psychotic features responding to ECT. Patient has had difficulty responding which has been getting worse with time as he has become more chronically impaired. Multiple suicide attempts in the past. Multiple hospitalizations.  Past Medical History:  Past Medical History  Diagnosis Date  . Mood swings ([place])   . Type II or unspecified type diabetes mellitus with unspecified complication, uncontrolled 06/02/2014  . Other and unspecified hyperlipidemia 06/02/2014  . OSA on CPAP 06/02/2014  . 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 Bipolar disorder ([place])   . Hypertension   . Gout   . Bipolar 1 disorder ([place])    History reviewed. No pertinent past surgical history. Family History:  Family History  Problem Relation Age of  Onset  . Dementia Mother   . Arthritis Father   . Heart failure Neg Hx    . Kidney failure Neg Hx   . Cancer Neg Hx    Family Psychiatric  History: Positive family history of depression and possibly bipolar disorder Social History:  History  Alcohol Use No     History  Drug Use No    Social History   Social History  . Marital Status: Married    Spouse Name: N/A  . Number of Children: N/A  . Years of Education: N/A   Social History Main Topics  . Smoking status: Never Smoker   . Smokeless tobacco: None  . Alcohol Use: No  . Drug Use: No  . Sexual Activity: Not Currently   Other Topics Concern  . None   Social History Narrative   *The patient has been married for over 30 years and is currently on disability. He lives with his wife in the [place] area and has one daughter.              Sleep: Good  Appetite:  Good  Current Medications: Current Facility-Administered Medications  Medication Dose Route Frequency Provider Last Rate Last Dose  . 0.9 %  sodium chloride infusion  250 mL Intravenous Once Gonzella Lex, MD      . acetaminophen (TYLENOL) tablet 650 mg  650 mg Oral Q6H PRN Gonzella Lex, MD   650 mg at 09/09/15 2215  . allopurinol (ZYLOPRIM) tablet 300 mg  300 mg Oral Daily Gonzella Lex, MD   300 mg at 09/13/15 0901  . alum & mag hydroxide-simeth (MAALOX/MYLANTA) 200-200-20 MG/5ML suspension 30 mL  30 mL Oral Q4H PRN Gonzella Lex, MD      . ammonium lactate (LAC-HYDRIN) 12 % lotion   Topical PRN Gonzella Lex, MD      . apixaban (ELIQUIS) tablet 5 mg  5 mg Oral BID Gonzella Lex, MD   5 mg at 09/13/15 2131  . aspirin EC tablet 81 mg  81 mg Oral Daily Gonzella Lex, MD   81 mg at 09/13/15 0901  . atorvastatin (LIPITOR) tablet 10 mg  10 mg Oral q1800 Gonzella Lex, MD   10 mg at 09/13/15 1724  . clonazePAM (KLONOPIN) tablet 1 mg  1 mg Oral QHS Gonzella Lex, MD   1 mg at 09/13/15 2131  . colchicine tablet 0.6 mg  0.6 mg Oral BID Hildred Priest, MD   0.6 mg at 09/13/15 2131  . finasteride (PROSCAR) tablet 5 mg   5 mg Oral Daily Gonzella Lex, MD   5 mg at 09/13/15 0901  . insulin aspart (novoLOG) injection 0-15 Units  0-15 Units Subcutaneous TID WC Chauncey Mann, MD   0 Units at 09/13/15 1723  . insulin aspart (novoLOG) injection 0-5 Units  0-5 Units Subcutaneous QHS Chauncey Mann, MD   3 Units at 09/12/15 2110  . insulin aspart (novoLOG) injection 4 Units  4 Units Subcutaneous TID WC Chauncey Mann, MD   4 Units at 09/14/15 0809  . insulin glargine (LANTUS) injection 15 Units  15 Units Subcutaneous QHS Chauncey Mann, MD   15 Units at 09/13/15 2145  . ketorolac (TORADOL) 30 MG/ML injection 30 mg  30 mg Intravenous Once Gonzella Lex, MD   30 mg at 09/10/15 1715  . lisinopril (PRINIVIL,ZESTRIL) tablet 5 mg  5 mg Oral Daily Gonzella Lex, MD   5  mg at 09/13/15 0901  . magnesium hydroxide (MILK OF MAGNESIA) suspension 30 mL  30 mL Oral Daily PRN Gonzella Lex, MD      . methylphenidate (RITALIN) tablet 5 mg  5 mg Oral BH-q7a Gonzella Lex, MD   5 mg at 09/14/15 [phone]  . midodrine (PROAMATINE) tablet 5 mg  5 mg Oral TID WC Gonzella Lex, MD   5 mg at 09/14/15 0809  . mirtazapine (REMERON) tablet 45 mg  45 mg Oral QHS Gonzella Lex, MD   45 mg at 09/13/15 2139  . OLANZapine (ZYPREXA) tablet 30 mg  30 mg Oral QHS Gonzella Lex, MD   30 mg at 09/13/15 2139  . polyethylene glycol (MIRALAX / GLYCOLAX) packet 17 g  17 g Oral Daily Gonzella Lex, MD   17 g at 09/11/15 0918  . tamsulosin (FLOMAX) capsule 0.4 mg  0.4 mg Oral QPC supper Gonzella Lex, MD   0.4 mg at 09/13/15 1726  . tuberculin injection 5 Units  5 Units Intradermal Once Gonzella Lex, MD   5 Units at 09/12/15 2040    Lab Results:  Results for orders placed or performed during the hospital encounter of 09/05/15 (from the past 48 hour(s))  Glucose, capillary     Status: Abnormal   Collection Time: 09/12/15  1:24 PM  Result Value Ref Range   Glucose-Capillary 208 (H) 65 - 99 mg/dL  Glucose, capillary     Status: Abnormal   Collection Time:  09/12/15  4:54 PM  Result Value Ref Range   Glucose-Capillary 359 (H) 65 - 99 mg/dL  Glucose, capillary     Status: Abnormal   Collection Time: 09/12/15  8:52 PM  Result Value Ref Range   Glucose-Capillary 173 (H) 65 - 99 mg/dL  Glucose, capillary     Status: Abnormal   Collection Time: 09/13/15  6:53 AM  Result Value Ref Range   Glucose-Capillary 171 (H) 65 - 99 mg/dL   Comment 1 Notify RN   Glucose, capillary     Status: Abnormal   Collection Time: 09/13/15 11:53 AM  Result Value Ref Range   Glucose-Capillary 170 (H) 65 - 99 mg/dL  Glucose, capillary     Status: Abnormal   Collection Time: 09/13/15  4:35 PM  Result Value Ref Range   Glucose-Capillary 115 (H) 65 - 99 mg/dL  Glucose, capillary     Status: Abnormal   Collection Time: 09/13/15  9:07 PM  Result Value Ref Range   Glucose-Capillary 112 (H) 65 - 99 mg/dL   Comment 1 Notify RN   Glucose, capillary     Status: Abnormal   Collection Time: 09/14/15  6:38 AM  Result Value Ref Range   Glucose-Capillary 103 (H) 65 - 99 mg/dL   Comment 1 Notify RN   Creatinine, serum     Status: None   Collection Time: 09/14/15  7:13 AM  Result Value Ref Range   Creatinine, Ser 0.96 0.61 - 1.24 mg/dL   GFR calc non Af Amer >60 >60 mL/min   GFR calc Af Amer >60 >60 mL/min    Comment: (NOTE) The eGFR has been calculated using the CKD EPI equation. This calculation has not been validated in all clinical situations. eGFR's persistently <60 mL/min signify possible Chronic Kidney Disease.   CBC     Status: Abnormal   Collection Time: 09/14/15  7:13 AM  Result Value Ref Range   WBC 3.8 3.8 - 10.6 K/uL  RBC 4.24 (L) 4.40 - 5.90 MIL/uL   Hemoglobin 12.8 (L) 13.0 - 18.0 g/dL   HCT 38.3 (L) 40.0 - 52.0 %   MCV 90.2 80.0 - 100.0 fL   MCH 30.2 26.0 - 34.0 pg   MCHC 33.5 32.0 - 36.0 g/dL   RDW 13.3 11.5 - 14.5 %   Platelets 212 150 - 440 K/uL    Physical Findings: AIMS: Facial and Oral Movements Muscles of Facial Expression: None,  normal Lips and Perioral Area: None, normal Jaw: None, normal Tongue: None, normal,Extremity Movements Upper (arms, wrists, hands, fingers): None, normal Lower (legs, knees, ankles, toes): None, normal, Trunk Movements Neck, shoulders, hips: None, normal, Overall Severity Severity of abnormal movements (highest score from questions above): None, normal Incapacitation due to abnormal movements: None, normal Patient's awareness of abnormal movements (rate only patient's report): No Awareness, Dental Status Current problems with teeth and/or dentures?: No Does patient usually wear dentures?: No  CIWA:    COWS:     Musculoskeletal: Strength & Muscle Tone: decreased Gait & Station: normal Patient leans: N/A  Psychiatric Specialty Exam: Review of Systems  Constitutional: Negative.   HENT: Negative.   Eyes: Negative.   Respiratory: Negative.   Cardiovascular: Negative.   Gastrointestinal: Negative.   Genitourinary: Negative.   Musculoskeletal: Positive for joint pain.  Skin: Negative.   Neurological: Negative.   Psychiatric/Behavioral: Positive for memory loss. Negative for depression, suicidal ideas, hallucinations and substance abuse. The patient is not nervous/anxious and does not have insomnia.     Blood pressure 133/85, pulse 101, temperature 97.5 F (36.4 C), temperature source Oral, resp. rate 20, height 6' (1.829 m), weight 96.163 kg (212 lb), SpO2 99 %.Body mass index is 28.75 kg/(m^2).  General Appearance: Casual  Eye Contact::  Fair  Speech:  Normal Rate  Volume:  Decreased  Mood:  Dysphoric  Affect:  Congruent  Thought Process:  Linear  Orientation:  Full (Time, Place, and Person)  Thought Content:  Negative  Suicidal Thoughts:  No  Homicidal Thoughts:  No  Memory:  Immediate;   Good Recent;   Fair Remote;   Fair  Judgement:  Impaired  Insight:  Shallow  Psychomotor Activity:  Decreased  Concentration:  Poor  Recall:  [employer] of Knowledge:Good   Language: Fair  Akathisia:  No  Handed:  Right  AIMS (if indicated):     Assets:  Desire for Improvement Financial Resources/Insurance Housing Intimacy Leisure Time Resilience Social Support  ADL's:  Intact  Cognition: Impaired,  Mild  Sleep:  Number of Hours: 7.75   Treatment Plan Summary: Daily contact with patient to assess and evaluate symptoms and progress in treatment, Medication management and Plan Patient had ECT this morning bilateral tolerated treatment well. I'm hoping that getting in as many treatments as we can really without causing cognitive impairment before discharge will help him to maintain function outside the hospital. Patient continues to be very withdrawn and require a lot of assistance in activity on the unit. He also continues to have some fragility to his blood pressure and pulse and blood sugars. Supportive counseling with patient. Did do ECT today. Do not anticipated Monday but likely Wednesday. We are hoping for discharge if we can identify appropriate rehabilitation by early next week. No other change to treatment plan today.   Bipolar disorder: Continue olanzapine 30 mg by mouth daily at bedtime and mirtazapine 45 mg by mouth daily at bedtime.  Continue Ritalin in order to address psychomotor retardation.  Continue ECT  Insomnia: Continue trazodone 25 mg daily at bedtime  Anxiety: Continue Klonopin  Diabetes: Continue Lantus by mouth daily at bedtime and NovoLog 4 units along with supplemental insulin  Hypertension: Continue lisinopril 5 mg  Orthostatic hypotension/tachycardia: Highest HR this weekend has been 111.  Continue Midodrine  Cardiovascular health: Continue aspirin daily  Dyslipidemia: Continue Lipitor  Recent pulmonary embolism: Continue eliquis  Gout: Continue allopurinol and  colchicine  BPH: Continue Proscar by mouth daily  No major changes will be made to his medication regimen.   Hildred Priest 09/14/2015, 8:45  AM

## 2015-09-14 NOTE — Progress Notes (Signed)
Christian [name] remains sad on approach, educated patient on using walker for ambulation, he verbalized understanding, he was cooperative with treatment, he remains depressed, he had no behavioral issues to report on shift.

## 2015-09-14 NOTE — [facility] Group Notes (Signed)
[facility] Group Notes:  (Nursing/MHT/Case Management/Adjunct)  Date:  09/14/2015  Time:  9:29 PM  Type of Therapy:  Group Therapy  Participation Level:  Active  Participation Quality:  Appropriate  Affect:  Appropriate  Cognitive:  Appropriate  Insight:  Appropriate  Engagement in Group:  Engaged  Modes of Intervention:  Discussion  Summary of Progress/Problems:  Christian [name] 09/14/2015, 9:29 PM

## 2015-09-15 ENCOUNTER — Other Ambulatory Visit: Payer: Self-pay | Admitting: *Deleted

## 2015-09-15 LAB — GLUCOSE, CAPILLARY
Glucose-Capillary: 136 mg/dL — ABNORMAL HIGH (ref 65–99)
Glucose-Capillary: 159 mg/dL — ABNORMAL HIGH (ref 65–99)
Glucose-Capillary: 139 mg/dL — ABNORMAL HIGH (ref 65–99)
Glucose-Capillary: 131 mg/dL — ABNORMAL HIGH (ref 65–99)

## 2015-09-15 NOTE — [facility] Group Notes (Signed)
[facility] LCSW Aftercare Discharge Planning Group Note   09/15/2015 4:08 PM  Participation Quality:  Patient attended and particpated in gropu minimally but was attentive AEB his body language and eye contact. Patient introduced himself and shared tthat his SMART goal is to "communicate with my family and in group better".   Mood/Affect:  Appropriate  Depression Rating:  5  Anxiety Rating:  5  Thoughts of Suicide:  No Will you contract for safety?   NA  Current AVH:  No  Plan for Discharge/Comments:  Patient reports he will discharge home with his wife  Transportation Means: Patient reports his wife will transport him home  Supports:Patient has a supportive wife and family and mental health followup  Lulu Riding, MSW, LCSWA

## 2015-09-15 NOTE — Plan of Care (Signed)
Problem: Alteration in mood Goal: STG-Patient is able to discuss feelings and issues (Patient is able to discuss feelings and issues leading to depression)  Outcome: Progressing Patient talks about his depression. Patient states that he cannot remember why he is depressed.

## 2015-09-15 NOTE — Plan of Care (Signed)
Problem: [facility] Participation in Recreation Therapeutic Interventions Goal: STG-Patient will demonstrate improved self esteem by identif STG: Self-Esteem - Within 5 treatment sessions, patient will verbalize at least 5 positive affirmation statements in each of 3 treatment sessions to increase self-esteem post d/c.  Outcome: Progressing Treatment Session 2; Completed 2 out of 3: At approximately 1:45 pm, LRT met with patient in patient room. Patient verbalized 5 positive affirmation statements. Patient reported it felt "empowering". LRT encouraged patient to continue saying positive affirmation statements.  Leonette Monarch, LRT/CTRS 10.17.16 4:55 pm Goal: STG-Other Recreation Therapy Goal (Specify) STG: Stress Management - Within 5 treatment sessions, patient will verbalize understanding of the stress management techniques in each of 2 treatment sessions to increase stress management skills post d/c.  Outcome: Completed/Met Date Met:  09/15/15 Treatment Session 2; Completed 2 out of 2: At approximately 1:45 pm, LRT met with patient in patient room. LRT educated patient on stress management techniques again. Patient verbalized understanding. LRT encouraged patient to read over and practice the stress management techniques.  Leonette Monarch, LRT/CTRS 10.17.16 4:58 pm

## 2015-09-15 NOTE — Progress Notes (Signed)
Inpatient Diabetes Program Recommendations  AACE/ADA: New Consensus Statement on Inpatient Glycemic Control (2015)  Target Ranges:  Prepandial:   less than 140 mg/dL      Peak postprandial:   less than 180 mg/dL (1-2 hours)      Critically ill patients:  140 - 180 mg/dL  Results for Christian [name], Christian [name] (MRN [id number]) as of 09/15/2015 09:50  Ref. Range 09/14/2015 06:38 09/14/2015 12:16 09/14/2015 16:42 09/14/2015 19:56 09/15/2015 06:47  Glucose-Capillary Latest Ref Range: 65-99 mg/dL 037 (H) 543 (H) 606 (H) 216 (H) 136 (H)   Review of Glycemic Control   Current orders for Inpatient glycemic control: Lantus 15 units QHS, Novolog 0-15 units TID with meals, Novolog 0-5 units HS, Novolog 4 units TID with meals for meal coverage  Inpatient Diabetes Program Recommendations: Insulin - Meal Coverage: Post prandial glucose is consistently elevated depite Novolog 4 units TID with meals for meal coverage. May want to consider increasing meal coverage to Novolog 6 units TID with meals if glucose is still elevated after changing diet to CARB MODIFIED. Diet: Please change diet from REGULAR to CARB MODIFIED.  Thanks, Orlando Penner, RN, MSN, CCRN, CDE Diabetes Coordinator Inpatient Diabetes Program [phone] (Team Pager from 8am to 5pm) [phone] ([facility] office) [phone] [facility] office) [phone] [facility] office)

## 2015-09-15 NOTE — [facility] Group Notes (Signed)
[facility] Group Notes:  (Nursing/MHT/Case Management/Adjunct)  Date:  09/15/2015  Time:  2:09 PM  Type of Therapy:  Psychoeducational Skills  Participation Level:  Did Not Attend     Christian [name] 09/15/2015, 2:09 PM

## 2015-09-15 NOTE — Progress Notes (Signed)
Nutrition Follow-up  INTERVENTION:   Meals and Snacks: Cater to patient preferences Coordination of Care: Will sign-off as pt eating 100% of meals. No further nutrition intervention warranted at this time. Please re-consult RD if change in poc.    NUTRITION DIAGNOSIS:   No nutrition diagnosis at this time.  GOAL:   Patient will meet greater than or equal to 90% of their needs; ongoing  MONITOR:    (Energy Intake, Anthropometrics, Electrolyte/Renal Profile, Glucose Profile)  RD notes possible discharge early this week per MD note yesterday    Diet Order:  Diet regular Room service appropriate?: Yes; Fluid consistency:: Thin    Current Nutrition: Pt eating 100% of meals per documentation.   Scheduled Medications:  . sodium chloride  250 mL Intravenous Once  . allopurinol  300 mg Oral Daily  . apixaban  5 mg Oral BID  . aspirin EC  81 mg Oral Daily  . atorvastatin  10 mg Oral q1800  . clonazePAM  1 mg Oral QHS  . colchicine  0.6 mg Oral BID  . finasteride  5 mg Oral Daily  . insulin aspart  0-15 Units Subcutaneous TID WC  . insulin aspart  0-5 Units Subcutaneous QHS  . insulin aspart  4 Units Subcutaneous TID WC  . insulin glargine  15 Units Subcutaneous QHS  . lisinopril  5 mg Oral Daily  . methylphenidate  5 mg Oral BH-q7a  . midodrine  5 mg Oral TID WC  . mirtazapine  45 mg Oral QHS  . OLANZapine  30 mg Oral QHS  . polyethylene glycol  17 g Oral Daily  . tamsulosin  0.4 mg Oral QPC supper    Electrolyte/Renal Profile and Glucose Profile:   Recent Labs Lab 09/09/15 0634 09/14/15 0713  CREATININE 1.06 0.96   Protein Profile: No results for input(s): ALBUMIN in the last 168 hours.     Weight Trend since Admission: Filed Weights   09/05/15 1827 09/10/15 1113 09/12/15 0944  Weight: 215 lb (97.523 kg) 212 lb (96.163 kg) 212 lb (96.163 kg)     Skin:   (deep tissue injury left heel)      Leda Quail, RD, LDN Pager [phone]

## 2015-09-15 NOTE — Progress Notes (Signed)
Patient denies SI/HI/AV this shift. Patient states his day was "uneventfull" when asked. Patient states that he is considering what the plans for discharge are. He states that he may go to a rehabilitation center. Patient states that he is unsure how he feels about this. Q 15 min checks maintained during shift. Will continue to monitor.

## 2015-09-15 NOTE — Progress Notes (Signed)
Recreation Therapy Notes  Date: 10.17.16 Time: 3:00 pm Location: Craft Room  Group Topic: Self-expression  Goal Area(s) Addresses:  Patient will be able to identify a color that represents each emotion. Patient will verbalize benefit of using art as a means of self-expression. Patient will verbalize one positive emotion experienced while participating in activity.  Behavioral Response: Arrived late, Attentive  Intervention: The Colors Within Me  Activity: Patients were given blank face worksheets and instructed to pick a color for each emotion they were experiencing and show how much of the emotion they were experiencing on the face.  Education: LRT educated patients on different forms of self-expression.  Education Outcome: In group clarification offered  Clinical Observations/Feedback: Patient arrived to group at approximately 3:30 pm. Patient completed activity by drawing a face. Patient did not contribute to group discussion.  Jacquelynn Cree, LRT/CTRS 09/15/2015 4:30 PM

## 2015-09-15 NOTE — Progress Notes (Signed)
Patient stated that he is glad today because he didn't have ECT.Minimal interaction with staff & peers.Denies suicidal ideation and hallucination.Compliant with medications.Attended some groups.

## 2015-09-15 NOTE — Plan of Care (Signed)
Problem: Ineffective individual coping Goal: STG: Patient will remain free from self harm Outcome: Progressing No self harm during shift.

## 2015-09-15 NOTE — Plan of Care (Signed)
Problem: Alteration in mood Goal: LTG-Pt's behavior demonstrates decreased signs of depression (Patient's behavior demonstrates decreased signs of depression to the point the patient is safe to return home and continue treatment in an outpatient setting)  Outcome: Progressing Attending groups.More visible in the milieu.

## 2015-09-16 LAB — GLUCOSE, CAPILLARY
Glucose-Capillary: 130 mg/dL — ABNORMAL HIGH (ref 65–99)
Glucose-Capillary: 215 mg/dL — ABNORMAL HIGH (ref 65–99)
Glucose-Capillary: 161 mg/dL — ABNORMAL HIGH (ref 65–99)
Glucose-Capillary: 128 mg/dL — ABNORMAL HIGH (ref 65–99)

## 2015-09-16 NOTE — [facility] Group Notes (Signed)
[facility] LCSW Group Therapy  09/16/2015 3:01 PM  Type of Therapy:  Group Therapy  Participation Level:  Did Not Attend   Lulu Riding, MSW, LCSWA 09/16/2015, 3:01 PM

## 2015-09-16 NOTE — [facility] Group Notes (Signed)
[facility] Group Notes:  (Nursing/MHT/Case Management/Adjunct)  Date:  09/16/2015  Time:  2:00 PM  Type of Therapy:  Psychoeducational Skills  Participation Level:  Did Not Attend    Mickey Farber 09/16/2015, 2:00 PM

## 2015-09-16 NOTE — Progress Notes (Signed)
Patient denies SI/HI during shift. Patient states that he is depressed but cannot remember why he is depressed. Support and encouragement offered from this Clinical research associate. Patient is pleasant and cooperatives. Med compliant. Q 15 min checks maintained for safety. Will continue to monitor.

## 2015-09-16 NOTE — Tx Team (Signed)
Interdisciplinary Treatment Plan Update (Adult)  Date:  09/16/2015 Time Reviewed:  4:49 PM  Progress in Treatment: Attending groups: No. Participating in groups:  No. Taking medication as prescribed:  Yes. Tolerating medication:  Yes. Family/Significant othe contact made:  Yes, individual(s) contacted:  [name], wife Patient understands diagnosis:  Yes. Discussing patient identified problems/goals with staff:  Yes. Medical problems stabilized or resolved:  Yes. can be maintained at SNF level of care New problem(s) identified: No, Describe:     Discharge Plan or Barriers: D/C [facility] 09/17/2015.  Auth # received from [facility] [id number].  Pt and family agreeable. Dr. Toni Amend aware and plans to continue ECT at discharge on outpatient basis.  Reason for Continuation of Hospitalization: Depression Other; describe Coordinating after-care plan  Comments:  Estimated length of stay: up to 1 day  New goal(s):  Review of initial/current patient goals per problem list:   See plan of Care  Attendees: Patient:  Christian [name] 10/18/20164:49 PM  Family:   10/18/20164:49 PM  Physician:  Dr. [name] 10/18/20164:49 PM  Nursing:   Hulan Amato, RN 10/18/20164:49 PM  Case Manager:   10/18/20164:49 PM  Counselor:  Jake Shark, LCSW 10/18/20164:49 PM  Other:  Hershal Coria, LRT 10/18/20164:49 PM  Other:  Beryl Meager, LCSWA 10/18/20164:49 PM  Other:   10/18/20164:49 PM  Other:  10/18/20164:49 PM  Other:  10/18/20164:49 PM  Other:  10/18/20164:49 PM  Other:  10/18/20164:49 PM  Other:  10/18/20164:49 PM  Other:  10/18/20164:49 PM  Other:   10/18/20164:49 PM   Scribe for Treatment Team:   Glennon Mac, 09/16/2015, 4:49 PM, MSW, LCSW

## 2015-09-16 NOTE — Progress Notes (Signed)
Stayed in bed most of the time.Cooperative on approach but does not talk much.Verbelized depression but denies suicidal ideation.Uses walker for ambulation.Compliant with meds.Did not attend groups.

## 2015-09-16 NOTE — Plan of Care (Signed)
Problem: [facility] Participation in Recreation Therapeutic Interventions Goal: STG-Patient will demonstrate improved self esteem by identif STG: Self-Esteem - Within 5 treatment sessions, patient will verbalize at least 5 positive affirmation statements in each of 3 treatment sessions to increase self-esteem post d/c.  Outcome: Completed/Met Date Met:  09/16/15 Treatment Session 3; Completed 3 out of 3: At approximately 10:45 am, LRT met with patient in patient room. Patient verbalized 5 positive affirmation statements. Patient reported he felt "empowered". LRT encouraged patient to continue saying positive affirmation statements.  Leonette Monarch, LRT/CTRS 10.18.16 1:35 pm

## 2015-09-16 NOTE — Plan of Care (Signed)
Problem: Alteration in mood Goal: LTG-Pt's behavior demonstrates decreased signs of depression (Patient's behavior demonstrates decreased signs of depression to the point the patient is safe to return home and continue treatment in an outpatient setting)  Outcome: Progressing Trying to smile during conversation with staff.

## 2015-09-16 NOTE — Progress Notes (Signed)
Pt is agreeable and cooperative. No complaints at this time, med compliant. FSBS was within normal limits. Pt agrees to notify RN if he has any needs at all.

## 2015-09-16 NOTE — Progress Notes (Signed)
Recreation Therapy Notes  Date: 10.18.16 Time: 3:00 pm Location: Craft Room  Group Topic: Goal Setting  Goal Area(s) Addresses:  Patient will write one goal. Patient will verbalize benefit of setting goals.  Behavioral Response: Did not attend  Intervention: Step By Step  Activity: Patients were given a worksheet with a foot on it. Patients were instructed to write a goal on the inside of the foot and write positive words on the outside of the foot.  Education: LRT educated patients on ways the can keep themselves focused on their goals.  Education Outcome: Patient did not attend group.   Clinical Observations/Feedback: Patient did not attend group.  Jacquelynn Cree, LRT/CTRS 09/16/2015 4:00 PM

## 2015-09-16 NOTE — Progress Notes (Signed)
Physical Therapy Treatment Patient Details Name: Christian [name] MRN: [id number] DOB: 1950-12-26 Today's Date: 09/16/2015    History of Present Illness 64 year old man with bipolar disorder depressed. Extended hospitalization for ECT treatment of severe psychotic depression. Had been transferred last weekend to the internal medicine service for evaluation of cardiac arrhythmia. Diagnosed eventually with pulmonary embolism. Now stable and transferred back to psychiatric service. Patient states he is not feeling good but has a hard time defining it. Denies any thoughts about suicide. Denies hallucinations.    PT Comments    Checked pt's orthostatic vitals prior to engaging in activity: pt did display values consistent with orthostatic hypotension (129/79 sitting to 101/69 when standing for 3 minutes). Pt did not complain of any significant light headedness or dizziness as he did during previous sessions. Pt was able to perform x 5 minutes of standing therapeutic exercises with RW without complaints of lightheadedness. Pt was also able to perform sit<>stand x 10 consecutive with supervision and without an assistive device and decreased severity orthostatic symptoms. Pt ambulated x 125 feet  to the medication room with supervision and gait pattern WNL. Due to pt's prolonged period of decreased functional mobility performance and potential for experiencing orthostatic decrease in BP during changes in position; pt is at an increased risk of falls with community/household mobility   Follow Up Recommendations  SNF     Equipment Recommendations  Rolling walker with 5" wheels    Recommendations for Other Services       Precautions / Restrictions Precautions Precautions: Fall Precaution Comments: orthostatic Restrictions Weight Bearing Restrictions: No LUE Weight Bearing: Weight bearing as tolerated    Mobility  Bed Mobility Overal bed mobility: Independent Bed Mobility: Supine to Sit      Supine to sit: Independent        Transfers Overall transfer level: Independent Equipment used: None Transfers: Sit to/from Stand Sit to Stand: Supervision            Ambulation/Gait Ambulation/Gait assistance: Supervision Ambulation Distance (Feet): 125 Feet Assistive device: Rolling walker (2 wheeled) Gait Pattern/deviations: WFL(Within Functional Limits)   Gait velocity interpretation: at or above normal speed for age/gender General Gait Details: Pt did well today with maintaining feet adequate distance apart and not crossing midline; good step length and speed also demonstrated today.    Stairs            Wheelchair Mobility    Modified Rankin (Stroke Patients Only)       Balance Overall balance assessment: No apparent balance deficits (not formally assessed)                                  Cognition Arousal/Alertness: Awake/alert Behavior During Therapy: WFL for tasks assessed/performed Overall Cognitive Status: Within Functional Limits for tasks assessed                      Exercises Other Exercises Other Exercises: sit<>stand x 10 from low surface: independent and w/o assistive device Other Exercises: standing exercises w/ BUE support on RW: marches x 10 bilat, heel raises x 10, toe raises x 10    General Comments        Pertinent Vitals/Pain Pain Assessment: No/denies pain    Home Living                      Prior Function  PT Goals (current goals can now be found in the care plan section) Acute Rehab PT Goals Patient Stated Goal: to go home PT Goal Formulation: With patient Time For Goal Achievement: 07/24/15 Additional Goals Additional Goal #1: Pt will reduce 5xSTS time to 12 seconds or less in order to demonstrate reduced risk of falls.  Progress towards PT goals: Progressing toward goals    Frequency  Min 2X/week    PT Plan Current plan remains appropriate    Co-evaluation              End of Session   Activity Tolerance: Patient tolerated treatment well Patient left:  (at medication room prior to lunch)     Time: 4098-1191 PT Time Calculation (min) (ACUTE ONLY): 15 min  Charges:                       G CodesGeorgina [name] 09/16/2015, 12:59 PM

## 2015-09-16 NOTE — Progress Notes (Signed)
[facility] MD Progress Note  09/16/2015 7:18 PM Christian [name]  MRN:  [id number] Patient states today that he is feeling "about the same". He denies having any suicidal thoughts. Still feels tired and doesn't seem to be very motivated. Doesn't report any psychotic symptoms. He is cooperative with treatment. He is able to understand that the plan currently involves referral to rehabilitation. Principal Problem: Bipolar disorder, now depressed [facility]) Diagnosis:   Patient Active Problem List   Diagnosis Date Noted  . Overdose [T50.901A] 09/11/2015  . Acute respiratory failure with hypoxia (HCC) [J96.01] 09/05/2015  . Elevated troponin [R79.89] 09/05/2015  . Somnolence [R40.0] 09/05/2015  . Bipolar depression (HCC) [F31.30] 09/05/2015  . Acute bilateral deep vein thrombosis (DVT) of femoral veins (HCC) [I82.413] 09/05/2015  . Bilateral pulmonary embolism (HCC) [I26.99] 09/02/2015  . Labile hypertension [I10]   . Pulmonary embolism with acute cor pulmonale (HCC) [I26.09]   . Dyspnea [R06.00]   . Orthostatic hypotension [I95.1]   . Bipolar disorder, in partial remission, most recent episode depressed (HCC) [F31.75]   . Syncope, near [R55] 08/31/2015  . Affective psychosis, bipolar (HCC) [F31.9]   . Bipolar disorder, now depressed (HCC) [F31.30] 08/28/2015  . Bipolar I disorder, severe, current or most recent episode depressed, with psychotic features (HCC) [F31.5]   . Pressure ulcer [L89.90] 07/30/2015  . Protein-calorie malnutrition, severe (HCC) [E43] 07/30/2015  . Dehydration [E86.0] 07/29/2015  . Bipolar disorder, current episode depressed, severe, with psychotic features (HCC) [F31.5] 07/09/2015  . Diabetes (HCC) [E11.9] 07/09/2015  . UTI (urinary tract infection) [N39.0] 06/19/2014  . Positive blood culture [R78.81] 06/19/2014  . Bipolar affective disorder, current episode manic with psychotic symptoms (HCC) [F31.2] 06/17/2014  . Type II or unspecified type diabetes mellitus with  unspecified complication, uncontrolled [E11.8, E11.65] 06/02/2014  . Other and unspecified hyperlipidemia [E78.5] 06/02/2014  . OSA on CPAP [G47.33] 06/02/2014  . 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 Gout [M10.9] 06/02/2014  . Hypertension [I10]    Total Time spent with patient: 30 minutes  Past Psychiatric History: Long-standing history of bipolar disorder currently a long severe depression with psychotic features responding to ECT. Patient has had difficulty responding which has been getting worse with time as he has become more chronically impaired. Multiple suicide attempts in the past. Multiple hospitalizations.  Past Medical History:  Past Medical History  Diagnosis Date  . Mood swings (HCC)   . Type II or unspecified type diabetes mellitus with unspecified complication, uncontrolled 06/02/2014  . Other and unspecified hyperlipidemia 06/02/2014  . OSA on CPAP 06/02/2014  . 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 Bipolar disorder (HCC)   . Hypertension   . Gout   . Bipolar 1 disorder (HCC)    History reviewed. No pertinent past surgical history. Family History:  Family History  Problem Relation Age of Onset  . Dementia Mother   . Arthritis Father   . Heart failure Neg Hx   . Kidney failure Neg Hx   . Cancer Neg Hx    Family Psychiatric  History: Positive family history of depression and possibly bipolar disorder Social History:  History  Alcohol Use No     History  Drug Use No    Social History   Social History  . Marital Status: Married    Spouse Name: N/A  . Number of Children: N/A  . Years of Education: N/A   Social History Main Topics  . Smoking  status: Never Smoker   . Smokeless tobacco: None  . Alcohol Use: No  . Drug Use: No  . Sexual Activity: Not Currently   Other Topics Concern  . None   Social History Narrative   *The patient has been married for over 30  years and is currently on disability. He lives with his wife in the [place] area and has one daughter.             Additional Social History:                         Sleep: Good  Appetite:  Good  Current Medications: Current Facility-Administered Medications  Medication Dose Route Frequency Provider Last Rate Last Dose  . 0.9 %  sodium chloride infusion  250 mL Intravenous Once Audery Amel, MD      . acetaminophen (TYLENOL) tablet 650 mg  650 mg Oral Q6H PRN Audery Amel, MD   650 mg at 09/09/15 2215  . allopurinol (ZYLOPRIM) tablet 300 mg  300 mg Oral Daily Audery Amel, MD   300 mg at 09/16/15 1032  . alum & mag hydroxide-simeth (MAALOX/MYLANTA) 200-200-20 MG/5ML suspension 30 mL  30 mL Oral Q4H PRN Audery Amel, MD      . ammonium lactate (LAC-HYDRIN) 12 % lotion   Topical PRN Audery Amel, MD      . apixaban (ELIQUIS) tablet 5 mg  5 mg Oral BID Audery Amel, MD   5 mg at 09/16/15 1031  . aspirin EC tablet 81 mg  81 mg Oral Daily Audery Amel, MD   81 mg at 09/16/15 1032  . atorvastatin (LIPITOR) tablet 10 mg  10 mg Oral q1800 Audery Amel, MD   10 mg at 09/16/15 1728  . clonazePAM (KLONOPIN) tablet 1 mg  1 mg Oral QHS Audery Amel, MD   1 mg at 09/15/15 2142  . colchicine tablet 0.6 mg  0.6 mg Oral BID Jimmy Footman, MD   0.6 mg at 09/16/15 1031  . finasteride (PROSCAR) tablet 5 mg  5 mg Oral Daily Audery Amel, MD   5 mg at 09/16/15 1031  . insulin aspart (novoLOG) injection 0-15 Units  0-15 Units Subcutaneous TID WC Darliss Ridgel, MD   3 Units at 09/16/15 1730  . insulin aspart (novoLOG) injection 0-5 Units  0-5 Units Subcutaneous QHS Darliss Ridgel, MD   2 Units at 09/14/15 2232  . insulin aspart (novoLOG) injection 4 Units  4 Units Subcutaneous TID WC Darliss Ridgel, MD   4 Units at 09/16/15 1729  . insulin glargine (LANTUS) injection 15 Units  15 Units Subcutaneous QHS Darliss Ridgel, MD   15 Units at 09/15/15 2143  . lisinopril  (PRINIVIL,ZESTRIL) tablet 5 mg  5 mg Oral Daily Audery Amel, MD   5 mg at 09/16/15 1032  . magnesium hydroxide (MILK OF MAGNESIA) suspension 30 mL  30 mL Oral Daily PRN Audery Amel, MD      . methylphenidate (RITALIN) tablet 5 mg  5 mg Oral BH-q7a Audery Amel, MD   5 mg at 09/16/15 0808  . midodrine (PROAMATINE) tablet 5 mg  5 mg Oral TID WC Audery Amel, MD   5 mg at 09/16/15 1728  . mirtazapine (REMERON) tablet 45 mg  45 mg Oral QHS Audery Amel, MD   45 mg at 09/15/15 2143  . OLANZapine (ZYPREXA) tablet  30 mg  30 mg Oral QHS Audery Amel, MD   30 mg at 09/15/15 2142  . polyethylene glycol (MIRALAX / GLYCOLAX) packet 17 g  17 g Oral Daily Audery Amel, MD   17 g at 09/15/15 0939  . tamsulosin (FLOMAX) capsule 0.4 mg  0.4 mg Oral QPC supper Audery Amel, MD   0.4 mg at 09/16/15 1728    Lab Results:  Results for orders placed or performed during the hospital encounter of 09/05/15 (from the past 48 hour(s))  Glucose, capillary     Status: Abnormal   Collection Time: 09/14/15  7:56 PM  Result Value Ref Range   Glucose-Capillary 216 (H) 65 - 99 mg/dL   Comment 1 Notify RN   Glucose, capillary     Status: Abnormal   Collection Time: 09/15/15  6:47 AM  Result Value Ref Range   Glucose-Capillary 136 (H) 65 - 99 mg/dL  Glucose, capillary     Status: Abnormal   Collection Time: 09/15/15 12:08 PM  Result Value Ref Range   Glucose-Capillary 159 (H) 65 - 99 mg/dL   Comment 1 Notify RN   Glucose, capillary     Status: Abnormal   Collection Time: 09/15/15  5:07 PM  Result Value Ref Range   Glucose-Capillary 139 (H) 65 - 99 mg/dL  Glucose, capillary     Status: Abnormal   Collection Time: 09/15/15  8:54 PM  Result Value Ref Range   Glucose-Capillary 131 (H) 65 - 99 mg/dL  Glucose, capillary     Status: Abnormal   Collection Time: 09/16/15  6:54 AM  Result Value Ref Range   Glucose-Capillary 130 (H) 65 - 99 mg/dL  Glucose, capillary     Status: Abnormal   Collection Time:  09/16/15 11:58 AM  Result Value Ref Range   Glucose-Capillary 215 (H) 65 - 99 mg/dL  Glucose, capillary     Status: Abnormal   Collection Time: 09/16/15  4:51 PM  Result Value Ref Range   Glucose-Capillary 161 (H) 65 - 99 mg/dL    Physical Findings: AIMS: Facial and Oral Movements Muscles of Facial Expression: None, normal Lips and Perioral Area: None, normal Jaw: None, normal Tongue: None, normal,Extremity Movements Upper (arms, wrists, hands, fingers): None, normal Lower (legs, knees, ankles, toes): None, normal, Trunk Movements Neck, shoulders, hips: None, normal, Overall Severity Severity of abnormal movements (highest score from questions above): None, normal Incapacitation due to abnormal movements: None, normal Patient's awareness of abnormal movements (rate only patient's report): No Awareness, Dental Status Current problems with teeth and/or dentures?: No Does patient usually wear dentures?: No  CIWA:    COWS:     Musculoskeletal: Strength & Muscle Tone: decreased Gait & Station: normal Patient leans: N/A  Psychiatric Specialty Exam: Review of Systems  Constitutional: Negative.   HENT: Negative.   Eyes: Negative.   Respiratory: Negative.   Cardiovascular: Negative.   Gastrointestinal: Negative.   Musculoskeletal: Negative.   Skin: Negative.   Neurological: Negative.   Psychiatric/Behavioral: Positive for memory loss. Negative for depression, suicidal ideas, hallucinations and substance abuse. The patient is not nervous/anxious and does not have insomnia.     Blood pressure 126/81, pulse 112, temperature 97.5 F (36.4 C), temperature source Oral, resp. rate 20, height 6' (1.829 m), weight 96.163 kg (212 lb), SpO2 99 %.Body mass index is 28.75 kg/(m^2).  General Appearance: Casual  Eye Contact::  Fair  Speech:  Slow  Volume:  Decreased  Mood:  Dysphoric  Affect:  Depressed  Thought Process:  Tangential  Orientation:  Full (Time, Place, and Person)  Thought  Content:  Negative  Suicidal Thoughts:  No  Homicidal Thoughts:  No  Memory:  Immediate;   Good Recent;   Fair Remote;   Fair  Judgement:  Impaired  Insight:  Shallow  Psychomotor Activity:  Decreased  Concentration:  Poor  Recall:  [employer] of Knowledge:Good  Language: Fair  Akathisia:  No  Handed:  Right  AIMS (if indicated):     Assets:  Desire for Improvement Financial Resources/Insurance Housing Intimacy Leisure Time Resilience Social Support  ADL's:  Intact  Cognition: Impaired,  Mild  Sleep:  Number of Hours: 7   Treatment Plan Summary: Daily contact with patient to assess and evaluate symptoms and progress in treatment, Medication management and Plan patient is scheduled for maintenance ECT treatment tomorrow. Cognitive symptoms seemed to improve quite a bit. Affect is still flat but he at least spoke a little bit more to me today than usual. Denies active suicidal ideation. I am anticipating a plan for discharge to rehabilitation tomorrow afternoon after ECT. Additionally, I note that his blood pressure is under better control. Not having the low dips in blood pressure. Sugars are looking under better control as well. Patient continues on medicine for his recent pulmonary embolism. Macyn Shropshire 09/16/2015, 7:18 PM

## 2015-09-17 ENCOUNTER — Inpatient Hospital Stay: Payer: Commercial Managed Care - HMO

## 2015-09-17 LAB — GLUCOSE, CAPILLARY
Glucose-Capillary: 101 mg/dL — ABNORMAL HIGH (ref 65–99)
Glucose-Capillary: 159 mg/dL — ABNORMAL HIGH (ref 65–99)
Glucose-Capillary: 126 mg/dL — ABNORMAL HIGH (ref 65–99)
Glucose-Capillary: 172 mg/dL — ABNORMAL HIGH (ref 65–99)
Glucose-Capillary: 146 mg/dL — ABNORMAL HIGH (ref 65–99)
Glucose-Capillary: 196 mg/dL — ABNORMAL HIGH (ref 65–99)

## 2015-09-17 LAB — CBC
HCT: 37.7 % — ABNORMAL LOW (ref 40.0–52.0)
Hemoglobin: 12.7 g/dL — ABNORMAL LOW (ref 13.0–18.0)
MCH: 30.4 pg (ref 26.0–34.0)
MCHC: 33.8 g/dL (ref 32.0–36.0)
MCV: 89.8 fL (ref 80.0–100.0)
Platelets: 210 10*3/uL (ref 150–440)
RBC: 4.2 MIL/uL — ABNORMAL LOW (ref 4.40–5.90)
RDW: 13.4 % (ref 11.5–14.5)
WBC: 3.7 10*3/uL — ABNORMAL LOW (ref 3.8–10.6)

## 2015-09-17 LAB — CREATININE, SERUM
Creatinine, Ser: 1.03 mg/dL (ref 0.61–1.24)
GFR calc Af Amer: >60 mL/min (ref >60)
GFR calc non Af Amer: >60 mL/min (ref >60)

## 2015-09-17 NOTE — Progress Notes (Signed)
ANTICOAGULATION CONSULT NOTE - Follow up Consult  Pharmacy Consult for Apixaban Indication: pulmonary embolus  No Known Allergies  Patient Measurements: Height: 6' (182.9 cm) Weight: 212 lb (96.163 kg) IBW/kg (Calculated) : 77.6  Vital Signs: Temp: 98.7 F (37.1 C) (10/19 0707) Temp Source: Oral (10/19 0707) BP: 112/78 mmHg (10/19 0707) Pulse Rate: 123 (10/19 0707)  Labs:  Recent Labs  09/17/15 0757  HGB 12.7*  HCT 37.7*  PLT 210  CREATININE 1.03    Estimated Creatinine Clearance: 89.4 mL/min (by C-G formula based on Cr of 1.03).   Medical History: Past Medical History  Diagnosis Date  . Mood swings (HCC)   . Type II or unspecified type diabetes mellitus with unspecified complication, uncontrolled 06/02/2014  . Other and unspecified hyperlipidemia 06/02/2014  . OSA on CPAP 06/02/2014  . 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 Bipolar disorder (HCC)   . Hypertension   . Gout   . Bipolar 1 disorder (HCC)     Medications:  Prescriptions prior to admission  Medication Sig Dispense Refill Last Dose  . allopurinol (ZYLOPRIM) 300 MG tablet Take 1 tablet (300 mg total) by mouth daily. For gout   Past Month at Unknown time  . apixaban (ELIQUIS) 5 MG TABS tablet Take 2 tablets (10 mg total) by mouth 2 (two) times daily. 28 tablet 0   . apixaban (ELIQUIS) 5 MG TABS tablet Take 1 tablet (5 mg total) by mouth 2 (two) times daily. 60 tablet 6   . aspirin EC 81 MG tablet Take 81 mg by mouth daily.   Past Month at Unknown time  . atorvastatin (LIPITOR) 10 MG tablet Take 1 tablet (10 mg total) by mouth daily. For high cholesterol   Past Month at Unknown time  . cholecalciferol (VITAMIN D) 400 UNITS TABS tablet Take 800 Units by mouth daily.   Past Month at Unknown time  . clonazePAM (KLONOPIN) 1 MG tablet Take 1 tablet (1 mg total) by mouth at bedtime. 30 tablet 0   . docusate sodium (COLACE) 100 MG capsule Take 2 capsules (200 mg total) by  mouth 2 (two) times daily. 10 capsule 0   . feeding supplement, GLUCERNA SHAKE, (GLUCERNA SHAKE) LIQD Take 237 mLs by mouth 3 (three) times daily with meals.  0   . finasteride (PROSCAR) 5 MG tablet Take 1 tablet (5 mg total) by mouth daily. 1 tablet 0   . insulin aspart (NOVOLOG) 100 UNIT/ML injection Inject 0-9 Units into the skin 3 (three) times daily with meals. 10 mL 11   . insulin aspart (NOVOLOG) 100 UNIT/ML injection Inject 5 Units into the skin 3 (three) times daily with meals. 10 mL 11   . insulin aspart (NOVOLOG) 100 UNIT/ML injection Inject 0-9 Units into the skin 3 (three) times daily with meals. 10 mL 11   . insulin glargine (LANTUS) 100 UNIT/ML injection Inject 0.12 mLs (12 Units total) into the skin at bedtime. 10 mL 11   . levofloxacin (LEVAQUIN) 750 MG tablet Take 1 tablet (750 mg total) by mouth daily. Please continue for 3 more days to complete course 3 tablet 0   . midodrine (PROAMATINE) 5 MG tablet Take 1 tablet (5 mg total) by mouth 3 (three) times daily with meals. 90 tablet 6   . mirtazapine (REMERON) 30 MG tablet Take 30 mg by mouth at bedtime.   07/24/2015 at Unknown time  . OLANZapine zydis (ZYPREXA) 15 MG disintegrating tablet Take 2 tablets (30  mg total) by mouth at bedtime. 1 tablet 0   . ondansetron (ZOFRAN) 4 MG tablet Take 1 tablet (4 mg total) by mouth every 6 (six) hours as needed for nausea. 20 tablet 0   . polyethylene glycol (MIRALAX / GLYCOLAX) packet Take 17 g by mouth daily. 14 each 0   . tamsulosin (FLOMAX) 0.4 MG CAPS capsule Take 1 capsule (0.4 mg total) by mouth at bedtime. 30 capsule 9   . traZODone (DESYREL) 50 MG tablet Take 0.5 tablets (25 mg total) by mouth 3 (three) times daily. (Patient taking differently: Take 200 mg by mouth at bedtime. )       Assessment: CrCl = 111.7 ml/min Pt was started on Apixaban 10 mg PO BID X 7 on 10/4.  Pt was d/c'd on 10/7 AM but has returned this evening 10/7 for bipolar disorder.  Goal of Therapy:  Resolution of  PE    Plan:  Eliquis 10 mg PO BID X 7D started on 10/4.  Will continue with Eliquis 10 mg PO BID for 7 more doses (through 10/10). Will start Eliquis 5mg  PO BID on 10/11.   10/10: Patient on Apixaban 10mg  bid for PE with transition to 5 mg bid tomorrow 10/11. F/U labs.  10/16: Continue apixaban 5 mg bid.   10/19: CBC/Scr stable. Continue apixaban 5mg  bid. Will recheck labs in 3 days.  Maryjo Rochester, PharmD Clinical Pharmacist  09/17/2015  8:40 AM

## 2015-09-17 NOTE — Progress Notes (Signed)
Pt has been quiet in his room for several hours, no distress noted, breathing regular and unlabored.

## 2015-09-17 NOTE — Progress Notes (Signed)
Recreation Therapy Notes  Date: 10.19.16 Time: 3:00 pm Location: Craft Room  Group Topic: Self-esteem  Goal Area(s) Addresses:  Patient will identify positive attributes about self. Patient will identify at least one healthy coping skill.  Behavioral Response: Did not attend  Intervention: All About Me  Activity: Patients were instructed to make an All About Me pamphlet listing their life's motto, positive traits, healthy coping skills, and their healthy support system.  Education: LRT educated patient on ways they can increase their self-esteem   Education Outcome: Patient did not attend group.  Clinical Observations/Feedback: Patient did not attend group.  Jacquelynn Cree, LRT/CTRS 09/17/2015 4:22 PM

## 2015-09-17 NOTE — Progress Notes (Signed)
[facility] MD Progress Note  09/17/2015 9:28 PM Christian [name]  MRN:  [id number] Follow-up for patient with bipolar disorder depression. I was very disappointed that the patient was allowed to eat this morning thus sabotaging his crucial maintenance ECT treatment. Patient reports that his mood is still a little bit down. He denies suicidal thoughts but I am a little worried that he told me that he was starting to find himself believing that I was dead which was one of his delusions before. We have had a delay in his discharge because of failure to have a PASSR number available yet although we do have a bed possibly available. Principal Problem: Bipolar disorder, now depressed [facility]) Diagnosis:   Patient Active Problem List   Diagnosis Date Noted  . Overdose [T50.901A] 09/11/2015  . Acute respiratory failure with hypoxia ([place]) [J96.01] 09/05/2015  . Elevated troponin [R79.89] 09/05/2015  . Somnolence [R40.0] 09/05/2015  . Bipolar depression ([place]) [F31.30] 09/05/2015  . Acute bilateral deep vein thrombosis (DVT) of femoral veins (HCC) [I82.413] 09/05/2015  . Bilateral pulmonary embolism ([place]) [I26.99] 09/02/2015  . Labile hypertension [I10]   . Pulmonary embolism with acute cor pulmonale ([place]) [I26.09]   . Dyspnea [R06.00]   . Orthostatic hypotension [I95.1]   . Bipolar disorder, in partial remission, most recent episode depressed ([place]) [F31.75]   . Syncope, near [R55] 08/31/2015  . Affective psychosis, bipolar ([place]) [F31.9]   . Bipolar disorder, now depressed ([place]) [F31.30] 08/28/2015  . Bipolar I disorder, severe, current or most recent episode depressed, with psychotic features ([place]) [F31.5]   . Pressure ulcer [L89.90] 07/30/2015  . Protein-calorie malnutrition, severe ([place]) [E43] 07/30/2015  . Dehydration [E86.0] 07/29/2015  . Bipolar disorder, current episode depressed, severe, with psychotic features ([place]) [F31.5] 07/09/2015  . Diabetes ([place]) [E11.9] 07/09/2015  . UTI (urinary tract  infection) [N39.0] 06/19/2014  . Positive blood culture [R78.81] 06/19/2014  . Bipolar affective disorder, current episode manic with psychotic symptoms ([place]) [F31.2] 06/17/2014  . Type II or unspecified type diabetes mellitus with unspecified complication, uncontrolled [E11.8, E11.65] 06/02/2014  . Other and unspecified hyperlipidemia [E78.5] 06/02/2014  . OSA on CPAP [G47.33] 06/02/2014  . 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 Gout [M10.9] 06/02/2014  . Hypertension [I10]    Total Time spent with patient: 30 minutes  Past Psychiatric History: Long-standing history of bipolar disorder currently a long severe depression with psychotic features responding to ECT. Patient has had difficulty responding which has been getting worse with time as he has become more chronically impaired. Multiple suicide attempts in the past. Multiple hospitalizations.  Past Medical History:  Past Medical History  Diagnosis Date  . Mood swings ([place])   . Type II or unspecified type diabetes mellitus with unspecified complication, uncontrolled 06/02/2014  . Other and unspecified hyperlipidemia 06/02/2014  . OSA on CPAP 06/02/2014  . 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 Bipolar disorder ([place])   . Hypertension   . Gout   . Bipolar 1 disorder ([place])    History reviewed. No pertinent past surgical history. Family History:  Family History  Problem Relation Age of Onset  . Dementia Mother   . Arthritis Father   . Heart failure Neg Hx   . Kidney failure Neg Hx   . Cancer Neg Hx    Family Psychiatric  History: Positive family history of depression and possibly bipolar disorder Social History:  History  Alcohol Use No  History  Drug Use No    Social History   Social History  . Marital Status: Married    Spouse Name: N/A  . Number of Children: N/A  . Years of Education: N/A   Social History Main Topics  .  Smoking status: Never Smoker   . Smokeless tobacco: None  . Alcohol Use: No  . Drug Use: No  . Sexual Activity: Not Currently   Other Topics Concern  . None   Social History Narrative   *The patient has been married for over 30 years and is currently on disability. He lives with his wife in the [place] area and has one daughter.             Additional Social History:                         Sleep: Good  Appetite:  Good  Current Medications: Current Facility-Administered Medications  Medication Dose Route Frequency Provider Last Rate Last Dose  . 0.9 %  sodium chloride infusion  250 mL Intravenous Once Gonzella Lex, MD      . acetaminophen (TYLENOL) tablet 650 mg  650 mg Oral Q6H PRN Gonzella Lex, MD   650 mg at 09/09/15 2215  . allopurinol (ZYLOPRIM) tablet 300 mg  300 mg Oral Daily Gonzella Lex, MD   300 mg at 09/17/15 1215  . alum & mag hydroxide-simeth (MAALOX/MYLANTA) 200-200-20 MG/5ML suspension 30 mL  30 mL Oral Q4H PRN Gonzella Lex, MD      . ammonium lactate (LAC-HYDRIN) 12 % lotion   Topical PRN Gonzella Lex, MD      . apixaban (ELIQUIS) tablet 5 mg  5 mg Oral BID Gonzella Lex, MD   5 mg at 09/17/15 1216  . aspirin EC tablet 81 mg  81 mg Oral Daily Gonzella Lex, MD   81 mg at 09/17/15 1217  . atorvastatin (LIPITOR) tablet 10 mg  10 mg Oral q1800 Gonzella Lex, MD   10 mg at 09/17/15 1826  . clonazePAM (KLONOPIN) tablet 1 mg  1 mg Oral QHS Gonzella Lex, MD   1 mg at 09/16/15 2034  . colchicine tablet 0.6 mg  0.6 mg Oral BID Hildred Priest, MD   0.6 mg at 09/17/15 1216  . finasteride (PROSCAR) tablet 5 mg  5 mg Oral Daily Gonzella Lex, MD   5 mg at 09/17/15 1216  . insulin aspart (novoLOG) injection 0-15 Units  0-15 Units Subcutaneous TID WC Chauncey Mann, MD   3 Units at 09/17/15 1217  . insulin aspart (novoLOG) injection 0-5 Units  0-5 Units Subcutaneous QHS Chauncey Mann, MD   2 Units at 09/14/15 2232  . insulin aspart (novoLOG)  injection 4 Units  4 Units Subcutaneous TID WC Chauncey Mann, MD   4 Units at 09/17/15 1218  . insulin glargine (LANTUS) injection 15 Units  15 Units Subcutaneous QHS Chauncey Mann, MD   15 Units at 09/16/15 2035  . lisinopril (PRINIVIL,ZESTRIL) tablet 5 mg  5 mg Oral Daily Gonzella Lex, MD   5 mg at 09/17/15 2841  . magnesium hydroxide (MILK OF MAGNESIA) suspension 30 mL  30 mL Oral Daily PRN Gonzella Lex, MD      . methylphenidate (RITALIN) tablet 5 mg  5 mg Oral BH-q7a Gonzella Lex, MD   5 mg at 09/16/15 0808  . midodrine (PROAMATINE) tablet 5  mg  5 mg Oral TID WC Gonzella Lex, MD   5 mg at 09/17/15 1842  . mirtazapine (REMERON) tablet 45 mg  45 mg Oral QHS Gonzella Lex, MD   45 mg at 09/16/15 2033  . OLANZapine (ZYPREXA) tablet 30 mg  30 mg Oral QHS Gonzella Lex, MD   30 mg at 09/16/15 2033  . polyethylene glycol (MIRALAX / GLYCOLAX) packet 17 g  17 g Oral Daily Gonzella Lex, MD   17 g at 09/15/15 0939  . tamsulosin (FLOMAX) capsule 0.4 mg  0.4 mg Oral QPC supper Gonzella Lex, MD   0.4 mg at 09/17/15 1827    Lab Results:  Results for orders placed or performed during the hospital encounter of 09/05/15 (from the past 48 hour(s))  Glucose, capillary     Status: Abnormal   Collection Time: 09/16/15  6:54 AM  Result Value Ref Range   Glucose-Capillary 130 (H) 65 - 99 mg/dL  Glucose, capillary     Status: Abnormal   Collection Time: 09/16/15 11:58 AM  Result Value Ref Range   Glucose-Capillary 215 (H) 65 - 99 mg/dL  Glucose, capillary     Status: Abnormal   Collection Time: 09/16/15  4:51 PM  Result Value Ref Range   Glucose-Capillary 161 (H) 65 - 99 mg/dL  Glucose, capillary     Status: Abnormal   Collection Time: 09/16/15  8:30 PM  Result Value Ref Range   Glucose-Capillary 128 (H) 65 - 99 mg/dL   Comment 1 Notify RN   Glucose, capillary     Status: Abnormal   Collection Time: 09/17/15  6:37 AM  Result Value Ref Range   Glucose-Capillary 126 (H) 65 - 99 mg/dL  CBC      Status: Abnormal   Collection Time: 09/17/15  7:57 AM  Result Value Ref Range   WBC 3.7 (L) 3.8 - 10.6 K/uL   RBC 4.20 (L) 4.40 - 5.90 MIL/uL   Hemoglobin 12.7 (L) 13.0 - 18.0 g/dL   HCT 37.7 (L) 40.0 - 52.0 %   MCV 89.8 80.0 - 100.0 fL   MCH 30.4 26.0 - 34.0 pg   MCHC 33.8 32.0 - 36.0 g/dL   RDW 13.4 11.5 - 14.5 %   Platelets 210 150 - 440 K/uL  Creatinine, serum     Status: None   Collection Time: 09/17/15  7:57 AM  Result Value Ref Range   Creatinine, Ser 1.03 0.61 - 1.24 mg/dL   GFR calc non Af Amer >60 >60 mL/min   GFR calc Af Amer >60 >60 mL/min    Comment: (NOTE) The eGFR has been calculated using the CKD EPI equation. This calculation has not been validated in all clinical situations. eGFR's persistently <60 mL/min signify possible Chronic Kidney Disease.   Glucose, capillary     Status: Abnormal   Collection Time: 09/17/15 11:50 AM  Result Value Ref Range   Glucose-Capillary 172 (H) 65 - 99 mg/dL   Comment 1 Notify RN   Glucose, capillary     Status: Abnormal   Collection Time: 09/17/15  5:16 PM  Result Value Ref Range   Glucose-Capillary 146 (H) 65 - 99 mg/dL   Comment 1 Notify RN   Glucose, capillary     Status: Abnormal   Collection Time: 09/17/15  8:15 PM  Result Value Ref Range   Glucose-Capillary 196 (H) 65 - 99 mg/dL    Physical Findings: AIMS: Facial and Oral Movements Muscles of Facial  Expression: None, normal Lips and Perioral Area: None, normal Jaw: None, normal Tongue: None, normal,Extremity Movements Upper (arms, wrists, hands, fingers): None, normal Lower (legs, knees, ankles, toes): None, normal, Trunk Movements Neck, shoulders, hips: None, normal, Overall Severity Severity of abnormal movements (highest score from questions above): None, normal Incapacitation due to abnormal movements: None, normal Patient's awareness of abnormal movements (rate only patient's report): No Awareness, Dental Status Current problems with teeth and/or  dentures?: No Does patient usually wear dentures?: No  CIWA:    COWS:     Musculoskeletal: Strength & Muscle Tone: decreased Gait & Station: normal Patient leans: N/A  Psychiatric Specialty Exam: Review of Systems  Constitutional: Negative.   HENT: Negative.   Eyes: Negative.   Respiratory: Negative.   Cardiovascular: Negative.   Gastrointestinal: Negative.   Musculoskeletal: Negative.   Skin: Negative.   Neurological: Negative.   Psychiatric/Behavioral: Positive for memory loss. Negative for depression, suicidal ideas, hallucinations and substance abuse. The patient is not nervous/anxious and does not have insomnia.     Blood pressure 127/86, pulse 93, temperature 98.7 F (37.1 C), temperature source Oral, resp. rate 20, height 6' (1.829 m), weight 96.163 kg (212 lb), SpO2 99 %.Body mass index is 28.75 kg/(m^2).  General Appearance: Casual  Eye Contact::  Fair  Speech:  Slow  Volume:  Decreased  Mood:  Dysphoric  Affect:  Depressed  Thought Process:  Tangential  Orientation:  Full (Time, Place, and Person)  Thought Content:  Negative  Suicidal Thoughts:  No  Homicidal Thoughts:  No  Memory:  Immediate;   Good Recent;   Fair Remote;   Fair  Judgement:  Impaired  Insight:  Shallow  Psychomotor Activity:  Decreased  Concentration:  Poor  Recall:  [employer] of Knowledge:Good  Language: Fair  Akathisia:  No  Handed:  Right  AIMS (if indicated):     Assets:  Desire for Improvement Financial Resources/Insurance Housing Intimacy Leisure Time Resilience Social Support  ADL's:  Intact  Cognition: Impaired,  Mild  Sleep:  Number of Hours: 7.45   Treatment Plan Summary: Daily contact with patient to assess and evaluate symptoms and progress in treatment, Medication management and Plan patient with severe depression as part of bipolar disorder. Only gradually got a little bit better with ECT. I am very worried that his gains are going to slip away now that his  maintenance treatment has been sabotaged. I will not be at the hospital on Friday and therefore cannot do treatment for him at that point. Earliest we would be able to schedule is Monday or Wednesday and I really don't know whether we will have discharge arranged by then. For now continue medication. Supportive therapy. Continue medicine for blood pressure and diabetes. Additionally, I note that his blood pressure is under better control. Not having the low dips in blood pressure. Sugars are looking under better control as well. Patient continues on medicine for his recent pulmonary embolism. Verta Riedlinger 09/17/2015, 9:28 PM

## 2015-09-17 NOTE — [facility] Group Notes (Signed)
[facility] LCSW Aftercare Discharge Planning Group Note   09/17/2015 12:54 PM  Patient Did Not Attend.  Jenel Lucks, Clinical Social Work Intern 09/17/15  Beryl Meager, MSW, Theresia Majors 09/18/15

## 2015-09-17 NOTE — Plan of Care (Signed)
Problem: Ineffective individual coping Goal: STG-Increase in ability to manage activities of daily living Outcome: Progressing Requires encouragement but will complete

## 2015-09-17 NOTE — [facility] Group Notes (Signed)
[facility] Group Notes:  (Nursing/MHT/Case Management/Adjunct)  Date:  09/17/2015  Time:  2:49 PM  Type of Therapy:  Psychoeducational Skills  Participation Level:  Did Not Attend   Lynelle Smoke [facility] 09/17/2015, 2:49 PM

## 2015-09-17 NOTE — Progress Notes (Signed)
Patient down to dayroom eating snack.  When asked why, states "I thought they were not coming to get me"  Reiterated that we had that discussion earlier in the day.  Patient just starred at me blankly.  Notified ECT that patient ate snack.

## 2015-09-18 DIAGNOSIS — I2699 Other pulmonary embolism without acute cor pulmonale: Secondary | ICD-10-CM | POA: Diagnosis not present

## 2015-09-18 DIAGNOSIS — F315 Bipolar disorder, current episode depressed, severe, with psychotic features: Secondary | ICD-10-CM | POA: Diagnosis not present

## 2015-09-18 DIAGNOSIS — E784 Other hyperlipidemia: Secondary | ICD-10-CM | POA: Diagnosis not present

## 2015-09-18 DIAGNOSIS — E785 Hyperlipidemia, unspecified: Secondary | ICD-10-CM | POA: Diagnosis not present

## 2015-09-18 DIAGNOSIS — G4733 Obstructive sleep apnea (adult) (pediatric): Secondary | ICD-10-CM | POA: Diagnosis not present

## 2015-09-18 DIAGNOSIS — M109 Gout, unspecified: Secondary | ICD-10-CM | POA: Diagnosis not present

## 2015-09-18 DIAGNOSIS — M311 Thrombotic microangiopathy: Secondary | ICD-10-CM | POA: Diagnosis not present

## 2015-09-18 DIAGNOSIS — I10 Essential (primary) hypertension: Secondary | ICD-10-CM | POA: Diagnosis not present

## 2015-09-18 DIAGNOSIS — E119 Type 2 diabetes mellitus without complications: Secondary | ICD-10-CM | POA: Diagnosis not present

## 2015-09-18 DIAGNOSIS — I951 Orthostatic hypotension: Secondary | ICD-10-CM | POA: Diagnosis not present

## 2015-09-18 DIAGNOSIS — F329 Major depressive disorder, single episode, unspecified: Secondary | ICD-10-CM | POA: Diagnosis not present

## 2015-09-18 DIAGNOSIS — F319 Bipolar disorder, unspecified: Secondary | ICD-10-CM | POA: Diagnosis not present

## 2015-09-18 DIAGNOSIS — R600 Localized edema: Secondary | ICD-10-CM | POA: Diagnosis not present

## 2015-09-18 LAB — GLUCOSE, CAPILLARY
Glucose-Capillary: 128 mg/dL — ABNORMAL HIGH (ref 65–99)
Glucose-Capillary: 188 mg/dL — ABNORMAL HIGH (ref 65–99)

## 2015-09-18 MED ORDER — INSULIN ASPART 100 UNIT/ML ~~LOC~~ SOLN: 0.0000 [IU] | Freq: Every day | SUBCUTANEOUS | Status: DC

## 2015-09-18 MED ORDER — INSULIN ASPART 100 UNIT/ML ~~LOC~~ SOLN: 0.0000 [IU] | Freq: Three times a day (TID) | SUBCUTANEOUS | Status: DC

## 2015-09-18 MED ORDER — INSULIN ASPART 100 UNIT/ML ~~LOC~~ SOLN: 4.0000 [IU] | Freq: Three times a day (TID) | SUBCUTANEOUS | Status: DC

## 2015-09-18 MED ORDER — CLONAZEPAM 1 MG PO TABS: 1.0000 mg | ORAL_TABLET | Freq: Every day | ORAL | Status: DC

## 2015-09-18 MED ORDER — ALLOPURINOL 300 MG PO TABS: 300.0000 mg | ORAL_TABLET | Freq: Every day | ORAL | Status: DC

## 2015-09-18 MED ORDER — COLCHICINE 0.6 MG PO TABS: 0.6000 mg | ORAL_TABLET | Freq: Two times a day (BID) | ORAL | Status: DC

## 2015-09-18 MED ORDER — LISINOPRIL 5 MG PO TABS: 5.0000 mg | ORAL_TABLET | Freq: Every day | ORAL | Status: DC

## 2015-09-18 MED ORDER — FINASTERIDE 5 MG PO TABS: 5.0000 mg | ORAL_TABLET | Freq: Every day | ORAL | Status: DC

## 2015-09-18 MED ORDER — OLANZAPINE 15 MG PO TABS: 30.0000 mg | ORAL_TABLET | Freq: Every day | ORAL | Status: DC

## 2015-09-18 MED ORDER — ATORVASTATIN CALCIUM 10 MG PO TABS: 10.0000 mg | ORAL_TABLET | Freq: Every day | ORAL | Status: DC

## 2015-09-18 MED ORDER — MIDODRINE HCL 5 MG PO TABS: 5.0000 mg | ORAL_TABLET | Freq: Three times a day (TID) | ORAL | Status: DC

## 2015-09-18 MED ORDER — TAMSULOSIN HCL 0.4 MG PO CAPS
0.4000 mg | ORAL_CAPSULE | Freq: Every day | ORAL | Status: DC
Start: 2015-09-18 — End: 2018-03-27

## 2015-09-18 MED ORDER — ASPIRIN 81 MG PO TBEC: 81.0000 mg | DELAYED_RELEASE_TABLET | Freq: Every day | ORAL | Status: DC

## 2015-09-18 MED ORDER — APIXABAN 5 MG PO TABS: 5.0000 mg | ORAL_TABLET | Freq: Two times a day (BID) | ORAL | Status: DC

## 2015-09-18 MED ORDER — POLYETHYLENE GLYCOL 3350 17 G PO PACK: 17.0000 g | PACK | Freq: Every day | ORAL | Status: DC

## 2015-09-18 MED ORDER — MIRTAZAPINE 45 MG PO TABS: 45.0000 mg | ORAL_TABLET | Freq: Every day | ORAL | Status: DC

## 2015-09-18 MED ORDER — METHYLPHENIDATE HCL 5 MG PO TABS: 5.0000 mg | ORAL_TABLET | ORAL | Status: DC

## 2015-09-18 MED ORDER — DOCUSATE SODIUM 100 MG PO CAPS: 200.0000 mg | ORAL_CAPSULE | Freq: Two times a day (BID) | ORAL | Status: DC

## 2015-09-18 MED ORDER — INSULIN GLARGINE 100 UNIT/ML ~~LOC~~ SOLN: 15.0000 [IU] | Freq: Every day | SUBCUTANEOUS | Status: DC

## 2015-09-18 NOTE — Progress Notes (Signed)
D:Patient aware of discharge this shift . Patient returning home . Patient received all belonging locked up . Patient denies  Suicidal  And homicidal ideations  .  A: Writer instructed on discharge criteria  . FL2 and prescriptions  given to wife . Aware  Of follow up appointment for ECT . R: Patient left unit with no questions  Or concerns  With wife

## 2015-09-18 NOTE — Progress Notes (Signed)
Withdrawn, isolative to room, didn't say much, disheveled, grumpy, sad and appeared depressed and despair. Patient was encouraged to express feelings but seemed to be uninterested. Behaviors are safe, room closer to the nurses' station; nursing staffs will continue to provide clinical and moral support.

## 2015-09-18 NOTE — Discharge Summary (Signed)
Physician Discharge Summary Note  Patient:  Christian [name] is an 64 y.o., male MRN:  [id number] DOB:  [birth year] Patient phone:  [phone] (home)  Patient address:   [address] [place] [address],  Total Time spent with patient: 30 minutes  Date of Admission:  09/05/2015 Date of Discharge: 09/18/2015  Reason for Admission:  This is a 64 year old man who was admitted through the emergency room for treatment of severe depression. He has a long-standing history of bipolar disorder and has been in a psychotic depression for months. Patient was unable to take care of himself having active delusions and suicidal ideation.  Principal Problem: Bipolar disorder, now depressed [facility]) Discharge Diagnoses: Patient Active Problem List   Diagnosis Date Noted  . Overdose [T50.901A] 09/11/2015  . Acute respiratory failure with hypoxia ([place]) [J96.01] 09/05/2015  . Elevated troponin [R79.89] 09/05/2015  . Somnolence [R40.0] 09/05/2015  . Bipolar depression ([place]) [F31.30] 09/05/2015  . Acute bilateral deep vein thrombosis (DVT) of femoral veins (HCC) [I82.413] 09/05/2015  . Bilateral pulmonary embolism ([place]) [I26.99] 09/02/2015  . Labile hypertension [I10]   . Pulmonary embolism with acute cor pulmonale ([place]) [I26.09]   . Dyspnea [R06.00]   . Orthostatic hypotension [I95.1]   . Bipolar disorder, in partial remission, most recent episode depressed ([place]) [F31.75]   . Syncope, near [R55] 08/31/2015  . Affective psychosis, bipolar ([place]) [F31.9]   . Bipolar disorder, now depressed ([place]) [F31.30] 08/28/2015  . Bipolar I disorder, severe, current or most recent episode depressed, with psychotic features ([place]) [F31.5]   . Pressure ulcer [L89.90] 07/30/2015  . Protein-calorie malnutrition, severe ([place]) [E43] 07/30/2015  . Dehydration [E86.0] 07/29/2015  . Bipolar disorder, current episode depressed, severe, with psychotic features ([place]) [F31.5] 07/09/2015  . Diabetes ([place]) [E11.9] 07/09/2015  . UTI  (urinary tract infection) [N39.0] 06/19/2014  . Positive blood culture [R78.81] 06/19/2014  . Bipolar affective disorder, current episode manic with psychotic symptoms ([place]) [F31.2] 06/17/2014  . Type II or unspecified type diabetes mellitus with unspecified complication, uncontrolled [E11.8, E11.65] 06/02/2014  . Other and unspecified hyperlipidemia [E78.5] 06/02/2014  . OSA on CPAP [G47.33] 06/02/2014  . 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 Gout [M10.9] 06/02/2014  . Hypertension [I10]     Musculoskeletal: Strength & Muscle Tone: decreased Gait & Station: normal Patient leans: N/A  Psychiatric Specialty Exam: Physical Exam  Nursing note and vitals reviewed. Constitutional: He appears well-developed and well-nourished.  HENT:  Head: Normocephalic and atraumatic.  Eyes: Conjunctivae are normal. Pupils are equal, round, and reactive to light.  Neck: Normal range of motion.  Cardiovascular: Normal heart sounds.   Respiratory: Effort normal.  GI: Soft.  Musculoskeletal: Normal range of motion.  Neurological: He is alert.  Skin: Skin is warm and dry.     Psychiatric: Judgment and thought content normal. His affect is blunt. His speech is delayed. He is slowed. Cognition and memory are normal. He expresses no suicidal ideation.    Review of Systems  Constitutional: Positive for malaise/fatigue.  HENT: Negative.   Eyes: Negative.   Respiratory: Negative.   Cardiovascular: Negative.   Gastrointestinal: Negative.   Musculoskeletal: Negative.   Skin: Negative.   Neurological: Negative.   Psychiatric/Behavioral: Positive for depression and memory loss. Negative for suicidal ideas, hallucinations and substance abuse. The patient is not nervous/anxious and does not have insomnia.     Blood pressure 140/91, pulse 87, temperature 98.4 F (36.9 C), temperature source Oral, resp. rate 20, height  6' (1.829 m), weight 96.163 kg (212  lb), SpO2 99 %.Body mass index is 28.75 kg/(m^2).  General Appearance: Casual  Eye Contact::  Fair  Speech:  Slow  Volume:  Decreased  Mood:  Dysphoric  Affect:  Flat  Thought Process:  Goal Directed  Orientation:  Full (Time, Place, and Person)  Thought Content:  Negative  Suicidal Thoughts:  No  Homicidal Thoughts:  No  Memory:  Immediate;   Good Recent;   Fair Remote;   Fair  Judgement:  Impaired  Insight:  Fair  Psychomotor Activity:  Decreased  Concentration:  Fair  Recall:  [employer] of Knowledge:Good  Language: Fair  Akathisia:  No  Handed:  Right  AIMS (if indicated):     Assets:  Communication Skills Desire for Improvement Financial Resources/Insurance Housing Intimacy Resilience Social Support Talents/Skills  ADL's:  Intact  Cognition: Impaired,  Mild  Sleep:  Number of Hours: 7.15   Have you used any form of tobacco in the last 30 days? (Cigarettes, Smokeless Tobacco, Cigars, and/or Pipes): No  Has this patient used any form of tobacco in the last 30 days? (Cigarettes, Smokeless Tobacco, Cigars, and/or Pipes) No  Past Medical History:  Past Medical History  Diagnosis Date  . Mood swings ([place])   . Type II or unspecified type diabetes mellitus with unspecified complication, uncontrolled 06/02/2014  . Other and unspecified hyperlipidemia 06/02/2014  . OSA on CPAP 06/02/2014  . 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 Bipolar disorder ([place])   . Hypertension   . Gout   . Bipolar 1 disorder ([place])    History reviewed. No pertinent past surgical history. Family History:  Family History  Problem Relation Age of Onset  . Dementia Mother   . Arthritis Father   . Heart failure Neg Hx   . Kidney failure Neg Hx   . Cancer Neg Hx    Social History:  History  Alcohol Use No     History  Drug Use No    Social History   Social History  . Marital Status: Married    Spouse Name: N/A  . Number of Children: N/A  . Years  of Education: N/A   Social History Main Topics  . Smoking status: Never Smoker   . Smokeless tobacco: None  . Alcohol Use: No  . Drug Use: No  . Sexual Activity: Not Currently   Other Topics Concern  . None   Social History Narrative   *The patient has been married for over 30 years and is currently on disability. He lives with his wife in the [place] area and has one daughter.              Past Psychiatric History: Hospitalizations:  Outpatient Care:  Substance Abuse Care:  Self-Mutilation:  Suicidal Attempts:  Violent Behaviors:   Risk to Self: Is patient at risk for suicide?: No Risk to Others:   Prior Inpatient Therapy:   Prior Outpatient Therapy:    Level of Care:  OP  Hospital Course:  Patient was admitted to the psychiatry ward for treatment of severe depression. Based on his past history we recommended consideration of ECT. Patient was initially too psychotic to be able to comprehend and give rational capable decisions. Based on his lack of capacity his wife was asked to give consent and she readily consented to ECT treatment. Both of them were very familiar with the treatment his having had it before. Patient has  received bilateral ECT treatments. His total number of treatments at this point is 16. Treatment course was interrupted at 2 points. After the first couple treatments we had such great difficulty placing an intravenous line in him, probably in part because he was still dehydrated, that we had to transfer him to the medical service so that a PICC line could be placed. Patient stayed on the medical service for more than a couple weeks getting ECT. Once he was showing improvement and was eating and moving about more we transferred him to the psychiatry service. We have been able to continue performing ECT and getting a regular intravenous line and regularly since then. Unfortunately he also had a weeks interruption in his treatment because over one weekend he  developed acute shortness of breath and atrial fibrillation which turned out to be due to a pulmonary embolism. He was transferred to the medical service. Once that was stabilized he was transferred back to psychiatry and has continued with ECT. Patient has shown improvement in his symptoms although he is still nowhere near in remission. He no longer shows evidence of delusions and psychotic symptoms and he denies having any thoughts about killing himself but he remains tired withdrawn and anhedonic somewhat cognitively impaired. At this point he appears to have plateaued and no longer necessarily needs inpatient hospital treatment. His wife however is strongly of the opinion that she cannot take care of him at home given his only partial improvement in his physical status. She is also very afraid given his multiple prior suicide attempts. We have made arrangements for [facility] to [facility] treatment. This is based on the fact that he has had a lot of difficulty ambulating and has required physical therapy assistance during his hospital stay.  Other medical problems assessed and treated included his diabetes which requires insulin and close monitoring and has been somewhat difficult to keep under good control. He has blood pressure problems manifested by frequent hypertension but with episodes of orthostasis and dizziness when medications are used. He also has chronic gout with some pain in his feet. He also has a small ulcer still on his right heel which has not been healing up well thanks to his diabetes. Wound care has seen him and has assessed and recommended dressing changes.  At this time he will be discharged and we would like to continue maintenance ECT and I have done what I can to put him on the schedule for this upcoming Monday  Consults:  cardiology and Hospitalist, anesthesia, physical therapy  Significant Diagnostic Studies:  labs: Persistent problems with his  blood sugar with daily multiple fingerstick checks still with some lability of his sugars radiology: X-Ray: Pulmonary embolism identified using appropriate techniques cardiac graphics: ECG: EKGs checked on several occasions due to transient atrial fibrillation  Discharge Vitals:   Blood pressure 140/91, pulse 87, temperature 98.4 F (36.9 C), temperature source Oral, resp. rate 20, height 6' (1.829 m), weight 96.163 kg (212 lb), SpO2 99 %. Body mass index is 28.75 kg/(m^2). Lab Results:   Results for orders placed or performed during the hospital encounter of 09/05/15 (from the past 72 hour(s))  Glucose, capillary     Status: Abnormal   Collection Time: 09/15/15 12:08 PM  Result Value Ref Range   Glucose-Capillary 159 (H) 65 - 99 mg/dL   Comment 1 Notify RN   Glucose, capillary     Status: Abnormal   Collection Time: 09/15/15  5:07 PM  Result Value Ref Range   Glucose-Capillary 139 (H) 65 - 99 mg/dL  Glucose, capillary     Status: Abnormal   Collection Time: 09/15/15  8:54 PM  Result Value Ref Range   Glucose-Capillary 131 (H) 65 - 99 mg/dL  Glucose, capillary     Status: Abnormal   Collection Time: 09/16/15  6:54 AM  Result Value Ref Range   Glucose-Capillary 130 (H) 65 - 99 mg/dL  Glucose, capillary     Status: Abnormal   Collection Time: 09/16/15 11:58 AM  Result Value Ref Range   Glucose-Capillary 215 (H) 65 - 99 mg/dL  Glucose, capillary     Status: Abnormal   Collection Time: 09/16/15  4:51 PM  Result Value Ref Range   Glucose-Capillary 161 (H) 65 - 99 mg/dL  Glucose, capillary     Status: Abnormal   Collection Time: 09/16/15  8:30 PM  Result Value Ref Range   Glucose-Capillary 128 (H) 65 - 99 mg/dL   Comment 1 Notify RN   Glucose, capillary     Status: Abnormal   Collection Time: 09/17/15  6:37 AM  Result Value Ref Range   Glucose-Capillary 126 (H) 65 - 99 mg/dL  CBC     Status: Abnormal   Collection Time: 09/17/15  7:57 AM  Result Value Ref Range   WBC 3.7 (L)  3.8 - 10.6 K/uL   RBC 4.20 (L) 4.40 - 5.90 MIL/uL   Hemoglobin 12.7 (L) 13.0 - 18.0 g/dL   HCT 37.7 (L) 40.0 - 52.0 %   MCV 89.8 80.0 - 100.0 fL   MCH 30.4 26.0 - 34.0 pg   MCHC 33.8 32.0 - 36.0 g/dL   RDW 13.4 11.5 - 14.5 %   Platelets 210 150 - 440 K/uL  Creatinine, serum     Status: None   Collection Time: 09/17/15  7:57 AM  Result Value Ref Range   Creatinine, Ser 1.03 0.61 - 1.24 mg/dL   GFR calc non Af Amer >60 >60 mL/min   GFR calc Af Amer >60 >60 mL/min    Comment: (NOTE) The eGFR has been calculated using the CKD EPI equation. This calculation has not been validated in all clinical situations. eGFR's persistently <60 mL/min signify possible Chronic Kidney Disease.   Glucose, capillary     Status: Abnormal   Collection Time: 09/17/15 11:50 AM  Result Value Ref Range   Glucose-Capillary 172 (H) 65 - 99 mg/dL   Comment 1 Notify RN   Glucose, capillary     Status: Abnormal   Collection Time: 09/17/15  5:16 PM  Result Value Ref Range   Glucose-Capillary 146 (H) 65 - 99 mg/dL   Comment 1 Notify RN   Glucose, capillary     Status: Abnormal   Collection Time: 09/17/15  8:15 PM  Result Value Ref Range   Glucose-Capillary 196 (H) 65 - 99 mg/dL  Glucose, capillary     Status: Abnormal   Collection Time: 09/18/15  6:47 AM  Result Value Ref Range   Glucose-Capillary 128 (H) 65 - 99 mg/dL   Comment 1 Notify RN     Physical Findings: AIMS: Facial and Oral Movements Muscles of Facial Expression: None, normal Lips and Perioral Area: None, normal Jaw: None, normal Tongue: None, normal,Extremity Movements Upper (arms, wrists, hands, fingers): None, normal Lower (legs, knees, ankles, toes): None, normal, Trunk Movements Neck, shoulders, hips: None, normal, Overall Severity Severity of abnormal movements (highest score from questions above): None, normal Incapacitation due to abnormal movements:  None, normal Patient's awareness of abnormal movements (rate only patient's  report): No Awareness, Dental Status Current problems with teeth and/or dentures?: No Does patient usually wear dentures?: No  CIWA:    COWS:      See Psychiatric Specialty Exam and Suicide Risk Assessment completed by Attending Physician prior to discharge.  Discharge destination:  Other:  Rehabilitation facility at [facility]  Is patient on multiple antipsychotic therapies at discharge:  No   Has Patient had three or more failed trials of antipsychotic monotherapy by history:  No    Recommended Plan for Multiple Antipsychotic Therapies: NA  Discharge Instructions    Diet - low sodium heart healthy    Complete by:  As directed      Discharge instructions    Complete by:  As directed   Return for ECT treatment Monday morning. ECT team will be in contact. MUST BE NPO FOR ECT TREATMENT     Discharge wound care:    Complete by:  As directed   Keep wound on heel clean and change dressing daily     Increase activity slowly    Complete by:  As directed             Medication List    STOP taking these medications        cholecalciferol 400 UNITS Tabs tablet  Commonly known as:  VITAMIN D     feeding supplement (GLUCERNA SHAKE) Liqd     levofloxacin 750 MG tablet  Commonly known as:  LEVAQUIN     olanzapine zydis 15 MG disintegrating tablet  Commonly known as:  ZYPREXA  Replaced by:  OLANZapine 15 MG tablet     ondansetron 4 MG tablet  Commonly known as:  ZOFRAN     traZODone 50 MG tablet  Commonly known as:  DESYREL      TAKE these medications      Indication   allopurinol 300 MG tablet  Commonly known as:  ZYLOPRIM  Take 1 tablet (300 mg total) by mouth daily.      apixaban 5 MG Tabs tablet  Commonly known as:  ELIQUIS  Take 1 tablet (5 mg total) by mouth 2 (two) times daily.      aspirin 81 MG EC tablet  Take 1 tablet (81 mg total) by mouth daily.      atorvastatin 10 MG tablet  Commonly known as:  LIPITOR  Take 1 tablet (10 mg total) by mouth  daily at 6 PM.      clonazePAM 1 MG tablet  Commonly known as:  KLONOPIN  Take 1 tablet (1 mg total) by mouth at bedtime.      colchicine 0.6 MG tablet  Take 1 tablet (0.6 mg total) by mouth 2 (two) times daily.      docusate sodium 100 MG capsule  Commonly known as:  COLACE  Take 2 capsules (200 mg total) by mouth 2 (two) times daily.      finasteride 5 MG tablet  Commonly known as:  PROSCAR  Take 1 tablet (5 mg total) by mouth daily.      insulin aspart 100 UNIT/ML injection  Commonly known as:  novoLOG  Inject 4 Units into the skin 3 (three) times daily with meals.      insulin aspart 100 UNIT/ML injection  Commonly known as:  novoLOG  Inject 0-15 Units into the skin 3 (three) times daily with meals.      insulin aspart 100 UNIT/ML injection  Commonly known as:  novoLOG  Inject 0-5 Units into the skin at bedtime.      insulin glargine 100 UNIT/ML injection  Commonly known as:  LANTUS  Inject 0.15 mLs (15 Units total) into the skin at bedtime.      lisinopril 5 MG tablet  Commonly known as:  PRINIVIL,ZESTRIL  Take 1 tablet (5 mg total) by mouth daily.      methylphenidate 5 MG tablet  Commonly known as:  RITALIN  Take 1 tablet (5 mg total) by mouth every morning.      midodrine 5 MG tablet  Commonly known as:  PROAMATINE  Take 1 tablet (5 mg total) by mouth 3 (three) times daily with meals.      mirtazapine 45 MG tablet  Commonly known as:  REMERON  Take 1 tablet (45 mg total) by mouth at bedtime.      OLANZapine 15 MG tablet  Commonly known as:  ZYPREXA  Take 2 tablets (30 mg total) by mouth at bedtime.      polyethylene glycol packet  Commonly known as:  MIRALAX / GLYCOLAX  Take 17 g by mouth daily.      tamsulosin 0.4 MG Caps capsule  Commonly known as:  FLOMAX  Take 1 capsule (0.4 mg total) by mouth daily after supper.            Follow-up Information    Follow up with [facility] . Go on 09/22/2015.   Why:  Please go to  the ECT clinic for your follow up ECT treatments with Dr. Weber Cooks.    Contact information:   [address]  [place]  [place], [place] [address] Phone: [phone]       Follow-up recommendations:  Activity:  Patient will have activity advanced as per the assessment at rehabilitation Diet:  Diet is to be low carbohydrate Other:  Patient is to return for follow-up maintenance ECT treatment  Comments:  Patient is being discharged as noted above. Prescriptions are all done on paper and will be provided with a 1 month supply at discharge. I will notify his regular outpatient psychiatrist, Dr. Caprice Beaver in [place] about his discharge.  Total Discharge Time: 35  Signed: Rorie Delmore 09/18/2015, 10:34 AM

## 2015-09-18 NOTE — Plan of Care (Signed)
Problem: Ineffective individual coping Goal: STG: Patient will remain free from self harm Outcome: Progressing Medications administered at bedtime as ordered  by the physician, medications Therapeutic Effects, SEs and Adverse effects discussed, questions encouraged; no PRN given, 15 minute checks maintained for safety, clinical and moral support provided, patient encouraged to continue to express feelings and demonstrate safe care. Patient remain free from harm, will continue to monitor.

## 2015-09-18 NOTE — [facility] Group Notes (Signed)
[facility] LCSW Group Therapy  09/18/2015 1:13 PM  Type of Therapy:  Group Therapy  Participation Level:  Did Not Attend  Modes of Intervention:  Discussion, Education, Socialization and Support  Summary of Progress/Problems: Balance in life: Patients will discuss the concept of balance and how it looks and feels to be unbalanced. Pt will identify areas in their life that is unbalanced and ways to become more balanced.   Christian [name] L Kaitlynd Phillips MSW, LCSWA  09/18/2015, 1:13 PM 

## 2015-09-18 NOTE — Progress Notes (Signed)
Recreation Therapy Notes  INPATIENT RECREATION TR PLAN  Patient Details Name: Christian [name] MRN: [id number] DOB: 07/16/1951 Today's Date: 09/18/2015  Rec Therapy Plan Is patient appropriate for Therapeutic Recreation?: Yes Treatment times per week: At least once a week TR Treatment/Interventions: 1:1 session, Group participation (Comment) (Appropriate participation in daily recreation therapy tx)  Discharge Criteria Pt will be discharged from therapy if:: Discharged Treatment plan/goals/alternatives discussed and agreed upon by:: Patient/family  Discharge Summary Short term goals set: See Care Plan Short term goals met: Complete Progress toward goals comments: Groups attended Which groups?: Other (Comment) (Self-expression) One-to-one attended: Self-esteem, stress management Reason goals not met: N/A Therapeutic equipment acquired: None Reason patient discharged from therapy: Discharge from hospital Pt/family agrees with progress & goals achieved: Yes Date patient discharged from therapy: 09/18/15   Leonette Monarch, LRT/CTRS 09/18/2015, 4:42 PM

## 2015-09-18 NOTE — Progress Notes (Signed)
  [facility] Adult Case Management Discharge Plan :  Will you be returning to the same living situation after discharge:  No. At discharge, do you have transportation home?: Yes,  Wife Do you have the ability to pay for your medications: Yes,  Insurance   Release of information consent forms completed and in the chart;  Patient's signature needed at discharge.  Patient to Follow up at: Follow-up Information    Follow up with [facility] . Go on 09/22/2015.   Why:  Please go to the ECT clinic for your follow up ECT treatments with Dr. Toni Amend.    Contact information:   [address]  [address]  [place], [place] [address] Phone: [phone]       Patient denies SI/HI: Yes,  Yes    Safety Planning and Suicide Prevention discussed: Yes,  with wife and patient   Have you used any form of tobacco in the last 30 days? (Cigarettes, Smokeless Tobacco, Cigars, and/or Pipes): No  Has patient been referred to the Quitline?: N/A patient is not a smoker  [employer] MSW, LCSWA  09/18/2015, 10:27 AM

## 2015-09-18 NOTE — Tx Team (Signed)
Interdisciplinary Treatment Plan Update (Adult)  Date:  09/18/2015 Time Reviewed:  10:23 AM  Progress in Treatment: Attending groups: No. Participating in groups:  No. Taking medication as prescribed:  Yes. Tolerating medication:  Yes. Family/Significant othe contact made:  Yes, individual(s) contacted:  Edward Jolly (wife)  Patient understands diagnosis:  Yes. Discussing patient identified problems/goals with staff:  Yes. Medical problems stabilized or resolved:  Yes. Denies suicidal/homicidal ideation: Yes. Issues/concerns per patient self-inventory:  Yes. Other:  New problem(s) identified: No, Describe:  Na  Discharge Plan or Barriers: Pt plans to discharge to [facility] for rehabilitation.   Reason for Continuation of Hospitalization: Depression Hallucinations Medical Issues Medication stabilization Suicidal ideation  Comments: Follow-up for patient with bipolar disorder depression. I was very disappointed that the patient was allowed to eat this morning thus sabotaging his crucial maintenance ECT treatment. Patient reports that his mood is still a little bit down. He denies suicidal thoughts but I am a little worried that he told me that he was starting to find himself believing that I was dead which was one of his delusions before.   Estimated length of stay: Pt will likely d/c today   New goal(s):  Review of initial/current patient goals per problem list:   1.  Goal(s): Patient will participate in aftercare plan * Met: 09/18/2015  * Target date: at discharge * As evidenced by: Patient will participate within aftercare plan AEB aftercare provider and housing plan at discharge being identified.   2.  Goal (s): Patient will exhibit decreased depressive symptoms and suicidal ideations. * Met: 09/18/2015  *  Target date: at discharge * As evidenced by: Patient will utilize self rating of depression at 3 or below and demonstrate decreased signs of depression or be  deemed stable for discharge by MD.  3.  Goal (s): Patient will demonstrate decreased symptoms of psychosis. * Met: 09/18/2015  *  Target date: at discharge * As evidenced by: Patient will not endorse signs of psychosis or be deemed stable for discharge by MD.   Attendees: Patient:  Christian [name] 10/20/201610:23 AM  Family:   10/20/201610:23 AM  Physician:  Dr. Weber Cooks  10/20/201610:23 AM  Nursing:   Marcie Bal, RN  10/20/201610:23 AM  Case Manager:   10/20/201610:23 AM  Counselor:   10/20/201610:23 AM  Other:  [place], LCSWA  10/20/201610:23 AM  Other:  Everitt Amber, LRT  10/20/201610:23 AM  Other:   10/20/201610:23 AM  Other:  10/20/201610:23 AM  Other:  10/20/201610:23 AM  Other:  10/20/201610:23 AM  Other:  10/20/201610:23 AM  Other:  10/20/201610:23 AM  Other:  10/20/201610:23 AM  Other:   10/20/201610:23 AM   Scribe for Treatment Team:   Wray Kearns MSW, LCSWA , 09/18/2015, 10:23 AM

## 2015-09-18 NOTE — [facility] Suicide Risk Assessment (Signed)
[facility] Discharge Suicide Risk Assessment   Demographic Factors:  Male and Unemployed  Total Time spent with patient: 45 minutes  Musculoskeletal: Strength & Muscle Tone: decreased Gait & Station: shuffle Patient leans: N/A  Psychiatric Specialty Exam: Physical Exam  ROS  Blood pressure 140/91, pulse 87, temperature 98.4 F (36.9 C), temperature source Oral, resp. rate 20, height 6' (1.829 m), weight 96.163 kg (212 lb), SpO2 99 %.Body mass index is 28.75 kg/(m^2).  General Appearance: Casual  Eye Contact::  Fair  Speech:  Slow409  Volume:  Decreased  Mood:  Dysphoric  Affect:  Flat  Thought Process:  Negative  Orientation:  Full (Time, Place, and Person)  Thought Content:  Negative  Suicidal Thoughts:  No  Homicidal Thoughts:  No  Memory:  Immediate;   Good Recent;   Fair Remote;   Fair  Judgement:  Fair  Insight:  Fair  Psychomotor Activity:  Decreased  Concentration:  Fair  Recall:  [employer] of Knowledge:Fair  Language: Fair  Akathisia:  No  Handed:  Right  AIMS (if indicated):     Assets:  Communication Skills Desire for Improvement Financial Resources/Insurance Housing Intimacy Resilience Social Support  Sleep:  Number of Hours: 7.15  Cognition: Impaired,  Mild  ADL's:  Intact   Have you used any form of tobacco in the last 30 days? (Cigarettes, Smokeless Tobacco, Cigars, and/or Pipes): No  Has this patient used any form of tobacco in the last 30 days? (Cigarettes, Smokeless Tobacco, Cigars, and/or Pipes) No  Mental Status Per Nursing Assessment::   On Admission:     Current Mental Status by Physician: Patient denies any current suicidal ideation or wish to die. It is noted that he has had multiple suicide attempts in the past  Loss Factors: Decline in physical health  Historical Factors: Prior suicide attempts  Risk Reduction Factors:   Sense of responsibility to family, Religious beliefs about death, Positive social support and Positive  therapeutic relationship  Continued Clinical Symptoms:  Bipolar Disorder:   Depressive phase  Cognitive Features That Contribute To Risk:  None    Suicide Risk:  Moderate:  Frequent suicidal ideation with limited intensity, and duration, some specificity in terms of plans, no associated intent, good self-control, limited dysphoria/symptomatology, some risk factors present, and identifiable protective factors, including available and accessible social support.  Principal Problem: Bipolar disorder, now depressed [facility]) Discharge Diagnoses:  Patient Active Problem List   Diagnosis Date Noted  . Overdose [T50.901A] 09/11/2015  . Acute respiratory failure with hypoxia (HCC) [J96.01] 09/05/2015  . Elevated troponin [R79.89] 09/05/2015  . Somnolence [R40.0] 09/05/2015  . Bipolar depression (HCC) [F31.30] 09/05/2015  . Acute bilateral deep vein thrombosis (DVT) of femoral veins (HCC) [I82.413] 09/05/2015  . Bilateral pulmonary embolism (HCC) [I26.99] 09/02/2015  . Labile hypertension [I10]   . Pulmonary embolism with acute cor pulmonale (HCC) [I26.09]   . Dyspnea [R06.00]   . Orthostatic hypotension [I95.1]   . Bipolar disorder, in partial remission, most recent episode depressed (HCC) [F31.75]   . Syncope, near [R55] 08/31/2015  . Affective psychosis, bipolar (HCC) [F31.9]   . Bipolar disorder, now depressed (HCC) [F31.30] 08/28/2015  . Bipolar I disorder, severe, current or most recent episode depressed, with psychotic features (HCC) [F31.5]   . Pressure ulcer [L89.90] 07/30/2015  . Protein-calorie malnutrition, severe (HCC) [E43] 07/30/2015  . Dehydration [E86.0] 07/29/2015  . Bipolar disorder, current episode depressed, severe, with psychotic features (HCC) [F31.5] 07/09/2015  . Diabetes (HCC) [E11.9] 07/09/2015  .  UTI (urinary tract infection) [N39.0] 06/19/2014  . Positive blood culture [R78.81] 06/19/2014  . Bipolar affective disorder, current episode manic with psychotic symptoms  (HCC) [F31.2] 06/17/2014  . Type II or unspecified type diabetes mellitus with unspecified complication, uncontrolled [E11.8, E11.65] 06/02/2014  . Other and unspecified hyperlipidemia [E78.5] 06/02/2014  . OSA on CPAP [G47.33] 06/02/2014  . 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 Gout [M10.9] 06/02/2014  . Hypertension [I10]     Follow-up Information    Follow up with [facility] . Go on 09/22/2015.   Why:  Please go to the ECT clinic for your follow up ECT treatments with Dr. Toni Amend.    Contact information:   [address]  [address]  [place], [place] [address] Phone: [phone]       Plan Of Care/Follow-up recommendations:  Activity:  Advance activity at rehabilitation as per their recommendation Diet:  Lowest carbohydrate Other:  Follow-up with medication management as well as maintenance ECT  Is patient on multiple antipsychotic therapies at discharge:  No   Has Patient had three or more failed trials of antipsychotic monotherapy by history:  No  Recommended Plan for Multiple Antipsychotic Therapies: NA    Skyy Nilan 09/18/2015, 10:45 AM

## 2015-09-19 ENCOUNTER — Other Ambulatory Visit: Payer: Self-pay

## 2015-09-22 ENCOUNTER — Encounter: Payer: Commercial Managed Care - HMO | Admitting: Anesthesiology

## 2015-09-22 ENCOUNTER — Other Ambulatory Visit: Payer: Self-pay

## 2015-09-22 ENCOUNTER — Encounter
Admission: RE | Admit: 2015-09-22 | Discharge: 2015-09-22 | Disposition: A | Discharge status: Home / Self Care | Payer: Commercial Managed Care - HMO | Source: Ambulatory Visit | Attending: Psychiatry | Admitting: Psychiatry

## 2015-09-22 ENCOUNTER — Encounter: Payer: Self-pay | Admitting: Anesthesiology

## 2015-09-22 DIAGNOSIS — E119 Type 2 diabetes mellitus without complications: Secondary | ICD-10-CM | POA: Insufficient documentation

## 2015-09-22 DIAGNOSIS — M109 Gout, unspecified: Secondary | ICD-10-CM | POA: Insufficient documentation

## 2015-09-22 DIAGNOSIS — E785 Hyperlipidemia, unspecified: Secondary | ICD-10-CM | POA: Diagnosis not present

## 2015-09-22 DIAGNOSIS — G4733 Obstructive sleep apnea (adult) (pediatric): Secondary | ICD-10-CM | POA: Insufficient documentation

## 2015-09-22 DIAGNOSIS — F319 Bipolar disorder, unspecified: Secondary | ICD-10-CM | POA: Diagnosis not present

## 2015-09-22 DIAGNOSIS — R600 Localized edema: Secondary | ICD-10-CM | POA: Insufficient documentation

## 2015-09-22 DIAGNOSIS — I10 Essential (primary) hypertension: Secondary | ICD-10-CM | POA: Insufficient documentation

## 2015-09-22 DIAGNOSIS — F315 Bipolar disorder, current episode depressed, severe, with psychotic features: Secondary | ICD-10-CM

## 2015-09-22 LAB — GLUCOSE, CAPILLARY: Glucose-Capillary: 187 mg/dL — ABNORMAL HIGH (ref 65–99)

## 2015-09-22 MED ORDER — SUCCINYLCHOLINE CHLORIDE 20 MG/ML IJ SOLN
150.0000 mg | Freq: Once | INTRAMUSCULAR | Status: AC
  Administered 2015-09-22: 150 mg via INTRAVENOUS

## 2015-09-22 MED ORDER — SODIUM CHLORIDE 0.9 % IV SOLN
INTRAVENOUS | Status: DC | PRN
  Administered 2015-09-22: 11:00:00 via INTRAVENOUS

## 2015-09-22 MED ORDER — LABETALOL HCL 5 MG/ML IV SOLN
20.0000 mg | Freq: Once | INTRAVENOUS | Status: AC
Start: 2015-09-22 — End: 2015-09-22
  Administered 2015-09-22: 20 mg via INTRAVENOUS

## 2015-09-22 MED ORDER — KETAMINE HCL 10 MG/ML IJ SOLN
120.0000 mg | Freq: Once | INTRAMUSCULAR | Status: AC
  Administered 2015-09-22: 120 mg via INTRAVENOUS

## 2015-09-22 MED ORDER — LABETALOL HCL 5 MG/ML IV SOLN
20.0000 mg | Freq: Once | INTRAVENOUS | Status: AC
  Administered 2015-09-22: 20 mg via INTRAVENOUS

## 2015-09-22 MED ORDER — SODIUM CHLORIDE 0.9 % IV SOLN
250.0000 mL | Freq: Once | INTRAVENOUS | Status: AC
  Administered 2015-09-22: 250 mL via INTRAVENOUS

## 2015-09-22 MED ORDER — LIDOCAINE HCL (CARDIAC) 20 MG/ML IV SOLN
4.0000 mg | Freq: Once | INTRAVENOUS | Status: AC
  Administered 2015-09-22: 4 mg via INTRAVENOUS

## 2015-09-22 NOTE — Procedures (Signed)
ECT SERVICES Physician's Interval Evaluation & Treatment Note  Patient Identification: Christian [name] MRN:  [id number] Date of Evaluation:  09/22/2015 TX #: 17  MADRS:   MMSE:   P.E. Findings:  No change to physical exam. Subjectively feeling stronger  Psychiatric Interval Note:  Mood is subjectively better denies hallucinations or suicidal thoughts  Subjective:  Patient is a 64 y.o. male seen for evaluation for Electroconvulsive Therapy. Feeling a little bit stronger and more motivated  Treatment Summary:   []   Right Unilateral             [x]  Bilateral   % Energy : 1.0 ms, 100%   Impedance: 620 homes  Seizure Energy Index: 2307 V squared  Postictal Suppression Index: 76%  Seizure Concordance Index: 99%  Medications  Pre Shock: Xylocaine 4 mg, labetalol 20 mg, ketamine 120 mg, succinylcholine 150 mg  Post Shock: Labetalol 20 mg  Seizure Duration: 28 seconds by EMG, 59 seconds by EEG   Comments: Follow-up one week on October 31   Lungs:  [x]   Clear to auscultation               []  Other:   Heart:    [x]   Regular rhythm             []  irregular rhythm    [x]   Previous H&P reviewed, patient examined and there are NO CHANGES                 []   Previous H&P reviewed, patient examined and there are changes noted.   [name], MD 10/24/201611:22 AM

## 2015-09-22 NOTE — Progress Notes (Signed)
fsbs 179

## 2015-09-22 NOTE — Transfer of Care (Signed)
Immediate Anesthesia Transfer of Care Note  Patient: Christian [name]  Procedure(s) Performed: * No procedures listed *  Patient Location: PACU  Anesthesia Type:General  Level of Consciousness: sedated  Airway & Oxygen Therapy: Patient Spontanous Breathing and Patient connected to face mask oxygen  Post-op Assessment: Report given, VSS  Post vital signs: Reviewed and stable  Last Vitals:  Filed Vitals:   09/22/15 0901  BP: 118/83  Pulse: 101  Temp:   Resp: 18   1149 VS: BP 159/118 Pulse 109 Spo2: 97%  Complications: No apparent anesthesia complications

## 2015-09-22 NOTE — H&P (Signed)
Christian [name] is an 64 y.o. male.   Chief Complaint: Patient feels like his depression is slightly less. Not having any hallucinations ideationnonewphysicalcomplaints.Overallfeelingstronger. HPI: Patient is now getting into a fence ECT phase  Past Medical History  Diagnosis Date  . Mood swings (HCC)   . Type II or unspecified type diabetes mellitus with unspecified complication, uncontrolled 06/02/2014  . Other and unspecified hyperlipidemia 06/02/2014  . OSA on CPAP 06/02/2014  . 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 Bipolar disorder (HCC)   . Hypertension   . Gout   . Bipolar 1 disorder (HCC)     History reviewed. No pertinent past surgical history.  Family History  Problem Relation Age of Onset  . Dementia Mother   . Arthritis Father   . Heart failure Neg Hx   . Kidney failure Neg Hx   . Cancer Neg Hx    Social History:  reports that he has never smoked. He does not have any smokeless tobacco history on file. He reports that he does not drink alcohol or use illicit drugs.  Allergies: No Known Allergies   (Not in a hospital admission)  No results found for this or any previous visit (from the past 48 hour(s)). No results found.  Review of Systems  Constitutional: Negative.   HENT: Negative.   Eyes: Negative.   Respiratory: Negative.   Cardiovascular: Negative.   Gastrointestinal: Negative.   Musculoskeletal: Negative.   Skin: Negative.   Neurological: Negative.   Psychiatric/Behavioral: Positive for depression. Negative for suicidal ideas, hallucinations, memory loss and substance abuse. The patient is not nervous/anxious and does not have insomnia.     Blood pressure 118/83, pulse 101, temperature 98.4 F (36.9 C), temperature source Tympanic, resp. rate 18, weight 101.152 kg (223 lb), SpO2 100 %. Physical Exam  Nursing note and vitals reviewed. Constitutional: He appears well-developed and well-nourished.  HENT:  Head:  Normocephalic and atraumatic.  Eyes: Conjunctivae are normal. Pupils are equal, round, and reactive to light.  Neck: Normal range of motion.  Cardiovascular: Normal rate, regular rhythm and normal heart sounds.   Respiratory: Effort normal and breath sounds normal. No respiratory distress. He has no wheezes.  GI: Soft.  Musculoskeletal: Normal range of motion.  Neurological: He is alert.  Skin: Skin is warm and dry.  Psychiatric: Judgment and thought content normal. His speech is delayed. He is slowed. Cognition and memory are normal.     Assessment/Plan Follow-up in another one week on the 31st  [name] 09/22/2015, 11:20 AM

## 2015-09-22 NOTE — Anesthesia Preprocedure Evaluation (Signed)
Anesthesia Evaluation  Patient identified by MRN, date of birth, ID band Patient confused    Reviewed: Allergy & Precautions, NPO status , Patient's Chart, lab work & pertinent test results  Airway Mallampati: II       Dental  (+) Edentulous Upper, Edentulous Lower   Pulmonary shortness of breath, sleep apnea ,     + decreased breath sounds      Cardiovascular hypertension, Pt. on medications + Peripheral Vascular Disease  Normal cardiovascular exam     Neuro/Psych Depression Bipolar Disorder    GI/Hepatic negative GI ROS, Neg liver ROS,   Endo/Other  diabetes, Type 1, Insulin Dependent  Renal/GU      Musculoskeletal   Abdominal (+) + obese,   Peds  Hematology   Anesthesia Other Findings   Reproductive/Obstetrics                             Anesthesia Physical Anesthesia Plan  ASA: III  Anesthesia Plan: General   Post-op Pain Management:    Induction: Intravenous  Airway Management Planned: Mask and Natural Airway  Additional Equipment:   Intra-op Plan:   Post-operative Plan:   Informed Consent: I have reviewed the patients History and Physical, chart, labs and discussed the procedure including the risks, benefits and alternatives for the proposed anesthesia with the patient or authorized representative who has indicated his/her understanding and acceptance.     Plan Discussed with: CRNA  Anesthesia Plan Comments:         Anesthesia Quick Evaluation

## 2015-09-22 NOTE — Anesthesia Procedure Notes (Signed)
Performed by: Malva Cogan Pre-anesthesia Checklist: Patient identified, Patient being monitored, Timeout performed, Emergency Drugs available and Suction available Patient Re-evaluated:Patient Re-evaluated prior to inductionOxygen Delivery Method: Circle system utilized Preoxygenation: Pre-oxygenation with 100% oxygen Ventilation: Mask ventilation with difficulty and Oral airway inserted - appropriate to patient size Airway Equipment and Method: Bite block Dental Injury: Teeth and Oropharynx as per pre-operative assessment

## 2015-09-22 NOTE — Anesthesia Postprocedure Evaluation (Signed)
  Anesthesia Post-op Note  Patient: Christian [name]  Procedure(s) Performed: * No procedures listed *  Anesthesia type:General  Patient location: PACU  Post pain: Pain level controlled  Post assessment: Post-op Vital signs reviewed, Patient's Cardiovascular Status Stable, Respiratory Function Stable, Patent Airway and No signs of Nausea or vomiting  Post vital signs: Reviewed and stable  Last Vitals:  Filed Vitals:   09/22/15 0901  BP: 118/83  Pulse: 101  Temp:   Resp: 18    Level of consciousness: awake, alert  and patient cooperative  Complications: No apparent anesthesia complications

## 2015-09-23 DIAGNOSIS — E784 Other hyperlipidemia: Secondary | ICD-10-CM | POA: Diagnosis not present

## 2015-09-23 DIAGNOSIS — F329 Major depressive disorder, single episode, unspecified: Secondary | ICD-10-CM | POA: Diagnosis not present

## 2015-09-23 DIAGNOSIS — M109 Gout, unspecified: Secondary | ICD-10-CM | POA: Diagnosis not present

## 2015-09-23 DIAGNOSIS — I10 Essential (primary) hypertension: Secondary | ICD-10-CM | POA: Diagnosis not present

## 2015-09-23 DIAGNOSIS — E119 Type 2 diabetes mellitus without complications: Secondary | ICD-10-CM | POA: Diagnosis not present

## 2015-09-23 LAB — GLUCOSE, CAPILLARY: Glucose-Capillary: 179 mg/dL — ABNORMAL HIGH (ref 65–99)

## 2015-09-24 ENCOUNTER — Other Ambulatory Visit: Payer: Self-pay

## 2015-09-24 ENCOUNTER — Encounter: Payer: Self-pay | Admitting: *Deleted

## 2015-09-24 ENCOUNTER — Other Ambulatory Visit: Payer: Self-pay | Admitting: *Deleted

## 2015-09-24 DIAGNOSIS — E118 Type 2 diabetes mellitus with unspecified complications: Secondary | ICD-10-CM

## 2015-09-24 DIAGNOSIS — Z794 Long term (current) use of insulin: Principal | ICD-10-CM

## 2015-09-24 NOTE — Patient Outreach (Signed)
[facility]) Care Management  09/24/2015  Christian [name] [birth year] [id number]   Documentation: referral for [facility] sent- f/u with pt at SNF.    [name].   Tirsa Gail RN CCM [facility] Care Management  [phone]

## 2015-09-24 NOTE — Patient Outreach (Signed)
Telephone call (f/u on [facility] referral).  Called pt's home number and person answering identified herself as pt's spouse, HIPPA verified on pt.  Spouse reports pt right now is in a SNF, just got out of the hospital, will be in SNF for next 2 weeks.   Spouse provided name of SNF- [facility].  RN CM informed spouse referral was received to f/u with pt, will call back again in 2 weeks.      [name].   Carlyle Achenbach RN CCM [facility] Care Management  [phone]

## 2015-09-24 NOTE — Patient Outreach (Signed)
Error entry.  Placed under LTW, needs to be [facility] Community.      [name].   Suriya Kovarik RN CCM [facility] Care Management  [phone]

## 2015-09-26 NOTE — Addendum Note (Signed)
Addendum  created 09/26/15 1531 by Lezlie Octave, MD   Modules edited: Anesthesia Attestations

## 2015-09-29 ENCOUNTER — Encounter: Payer: Commercial Managed Care - HMO | Admitting: Anesthesiology

## 2015-09-29 ENCOUNTER — Encounter: Payer: Self-pay | Admitting: Anesthesiology

## 2015-09-29 ENCOUNTER — Encounter
Admission: RE | Admit: 2015-09-29 | Discharge: 2015-09-29 | Disposition: A | Discharge status: Home / Self Care | Payer: Commercial Managed Care - HMO | Source: Ambulatory Visit | Attending: Psychiatry | Admitting: Psychiatry

## 2015-09-29 DIAGNOSIS — E785 Hyperlipidemia, unspecified: Secondary | ICD-10-CM | POA: Insufficient documentation

## 2015-09-29 DIAGNOSIS — I10 Essential (primary) hypertension: Secondary | ICD-10-CM | POA: Insufficient documentation

## 2015-09-29 DIAGNOSIS — F315 Bipolar disorder, current episode depressed, severe, with psychotic features: Secondary | ICD-10-CM | POA: Diagnosis not present

## 2015-09-29 DIAGNOSIS — R600 Localized edema: Secondary | ICD-10-CM | POA: Diagnosis not present

## 2015-09-29 DIAGNOSIS — E119 Type 2 diabetes mellitus without complications: Secondary | ICD-10-CM | POA: Diagnosis not present

## 2015-09-29 DIAGNOSIS — M109 Gout, unspecified: Secondary | ICD-10-CM | POA: Diagnosis not present

## 2015-09-29 DIAGNOSIS — G4733 Obstructive sleep apnea (adult) (pediatric): Secondary | ICD-10-CM | POA: Diagnosis not present

## 2015-09-29 HISTORY — DX: Personal history of other venous thrombosis and embolism: Z86.718

## 2015-09-29 LAB — GLUCOSE, CAPILLARY: Glucose-Capillary: 123 mg/dL — ABNORMAL HIGH (ref 65–99)

## 2015-09-29 MED ORDER — SODIUM CHLORIDE 0.9 % IV SOLN
250.0000 mL | Freq: Once | INTRAVENOUS | Status: AC
  Administered 2015-09-29: 250 mL via INTRAVENOUS

## 2015-09-29 MED ORDER — SUCCINYLCHOLINE CHLORIDE 20 MG/ML IJ SOLN
150.0000 mg | Freq: Once | INTRAMUSCULAR | Status: AC
  Administered 2015-09-29: 150 mg via INTRAVENOUS

## 2015-09-29 MED ORDER — KETAMINE HCL 10 MG/ML IJ SOLN
120.0000 mg | Freq: Once | INTRAMUSCULAR | Status: AC
  Administered 2015-09-29: 120 mg via INTRAVENOUS

## 2015-09-29 MED ORDER — SODIUM CHLORIDE 0.9 % IV SOLN
INTRAVENOUS | Status: DC | PRN
  Administered 2015-09-29: 12:00:00 via INTRAVENOUS

## 2015-09-29 MED ORDER — LABETALOL HCL 5 MG/ML IV SOLN
20.0000 mg | Freq: Once | INTRAVENOUS | Status: AC
  Administered 2015-09-29: 20 mg via INTRAVENOUS

## 2015-09-29 MED ORDER — NITROGLYCERIN 0.2 MG/ML ON CALL CATH LAB
100.0000 ug | Freq: Once | INTRAVENOUS | Status: AC
  Administered 2015-09-29: 100 ug via INTRAVENOUS

## 2015-09-29 MED ORDER — LIDOCAINE HCL (CARDIAC) 20 MG/ML IV SOLN
4.0000 mg | Freq: Once | INTRAVENOUS | Status: AC
  Administered 2015-09-29: 4 mg via INTRAVENOUS

## 2015-09-29 NOTE — Anesthesia Preprocedure Evaluation (Addendum)
Anesthesia Evaluation  Patient identified by MRN, date of birth, ID band Patient confused    Reviewed: Allergy & Precautions, NPO status , Patient's Chart, lab work & pertinent test results  Airway Mallampati: II       Dental  (+) Edentulous Upper, Edentulous Lower   Pulmonary shortness of breath, sleep apnea ,     + decreased breath sounds      Cardiovascular hypertension, Pt. on medications + Peripheral Vascular Disease and + DVT  Normal cardiovascular exam     Neuro/Psych Depression Bipolar Disorder    GI/Hepatic negative GI ROS, Neg liver ROS,   Endo/Other  diabetes, Type 1, Insulin Dependent  Renal/GU      Musculoskeletal   Abdominal (+) + obese,   Peds  Hematology   Anesthesia Other Findings   Reproductive/Obstetrics                            Anesthesia Physical  Anesthesia Plan  ASA: III  Anesthesia Plan: General   Post-op Pain Management:    Induction: Intravenous  Airway Management Planned: Mask and Natural Airway  Additional Equipment:   Intra-op Plan:   Post-operative Plan:   Informed Consent: I have reviewed the patients History and Physical, chart, labs and discussed the procedure including the risks, benefits and alternatives for the proposed anesthesia with the patient or authorized representative who has indicated his/her understanding and acceptance.     Plan Discussed with: CRNA  Anesthesia Plan Comments:         Anesthesia Quick Evaluation

## 2015-09-29 NOTE — Progress Notes (Signed)
Dr Henrene Hawking aware of b/p 182./125 with no further orders at this time.

## 2015-09-29 NOTE — H&P (Signed)
Christian [name] is an 64 y.o. male.   Chief Complaint: Continued follow-up maintenance treatment for patient with bipolar disorder severe with recent psychotic depression HPI: No new complaint. Physically doing better. Going home today from rehabilitation  Past Medical History  Diagnosis Date  . Mood swings (HCC)   . Type II or unspecified type diabetes mellitus with unspecified complication, uncontrolled 06/02/2014  . Other and unspecified hyperlipidemia 06/02/2014  . OSA on CPAP 06/02/2014  . 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 Bipolar disorder (HCC)   . Hypertension   . Gout   . Bipolar 1 disorder (HCC)   . Hx of blood clots     History reviewed. No pertinent past surgical history.  Family History  Problem Relation Age of Onset  . Dementia Mother   . Arthritis Father   . Heart failure Neg Hx   . Kidney failure Neg Hx   . Cancer Neg Hx    Social History:  reports that he has never smoked. He does not have any smokeless tobacco history on file. He reports that he does not drink alcohol or use illicit drugs.  Allergies: No Known Allergies   (Not in a hospital admission)  Results for orders placed or performed during the hospital encounter of 09/29/15 (from the past 48 hour(s))  Glucose, capillary     Status: Abnormal   Collection Time: 09/29/15  9:25 AM  Result Value Ref Range   Glucose-Capillary 123 (H) 65 - 99 mg/dL   Comment 1 Notify RN    No results found.  Review of Systems  Constitutional: Negative.   HENT: Negative.   Eyes: Negative.   Respiratory: Negative.   Cardiovascular: Negative.   Gastrointestinal: Negative.   Musculoskeletal: Negative.   Skin: Negative.   Neurological: Negative.   Psychiatric/Behavioral: Negative for depression, suicidal ideas, hallucinations, memory loss and substance abuse. The patient is not nervous/anxious and does not have insomnia.     Blood pressure 158/98, pulse 93, temperature 98.1  F (36.7 C), temperature source Oral, resp. rate 18, height 6' (1.829 m), weight 104.327 kg (230 lb), SpO2 98 %. Physical Exam  Nursing note and vitals reviewed. Constitutional: He appears well-developed and well-nourished.  HENT:  Head: Normocephalic and atraumatic.  Eyes: Conjunctivae are normal. Pupils are equal, round, and reactive to light.  Neck: Normal range of motion.  Cardiovascular: Normal rate, regular rhythm and normal heart sounds.   Respiratory: Effort normal and breath sounds normal. No respiratory distress. He has no wheezes.  GI: Soft.  Musculoskeletal: Normal range of motion.  Neurological: He is alert.  Skin: Skin is warm and dry.  Psychiatric: He has a normal mood and affect. Judgment and thought content normal. His speech is delayed. He is slowed. Cognition and memory are normal.     Assessment/Plan Patient continues to benefit. Follow-up one week. Also try and let his outpatient psychiatrist no he is being discharged.  Christian [name] 09/29/2015, 11:33 AM

## 2015-09-29 NOTE — Anesthesia Postprocedure Evaluation (Signed)
  Anesthesia Post-op Note  Patient: Christian [name]  Procedure(s) Performed: * No procedures listed *  Anesthesia type:General  Patient location: PACU  Post pain: Pain level controlled  Post assessment: Post-op Vital signs reviewed, Patient's Cardiovascular Status Stable, Respiratory Function Stable, Patent Airway and No signs of Nausea or vomiting  Post vital signs: Reviewed and stable  Last Vitals:  Filed Vitals:   09/29/15 0920  BP: 158/98  Pulse: 93  Temp: 36.7 C  Resp: 18    Level of consciousness: awake, alert  and patient cooperative  Complications: No apparent anesthesia complications

## 2015-09-29 NOTE — Anesthesia Procedure Notes (Signed)
Performed by: Junious Silk Patient Re-evaluated:Patient Re-evaluated prior to inductionOxygen Delivery Method: Circle system utilized Preoxygenation: Pre-oxygenation with 100% oxygen Intubation Type: IV induction Ventilation: Mask ventilation without difficulty and Mask ventilation throughout procedure Airway Equipment and Method: Bite block Placement Confirmation: positive ETCO2 Dental Injury: Teeth and Oropharynx as per pre-operative assessment

## 2015-09-29 NOTE — Procedures (Signed)
ECT SERVICES Physician's Interval Evaluation & Treatment Note  Patient Identification: Christian [name] MRN:  [id number] Date of Evaluation:  09/29/2015 TX #: 18  MADRS:   MMSE:   P.E. Findings:  Seem significantly stronger and more alert  Psychiatric Interval Note:  Mood seems to be better feels better brighter affect  Subjective:  Patient is a 64 y.o. male seen for evaluation for Electroconvulsive Therapy. No specific new complaints except still having some trouble with ambulation and working with physical therapy  Treatment Summary:   []   Right Unilateral             [x]  Bilateral   % Energy : 1.0 ms, 100%   Impedance: 610 ohms  Seizure Energy Index: 1804 V squared  Postictal Suppression Index: 13%  Seizure Concordance Index: 99%  Medications  Pre Shock: Xylocaine 4 mg, labetalol 20 mg, ketamine 120 mg, succinylcholine 150 mg  Post Shock: Nitroglycerin 100 g. Nitroglycerin 100 g (2 separate doses)  Seizure Duration: 33 seconds by EMG, 60 seconds by EEG   Comments: Follow-up in one week on November 7   Lungs:  [x]   Clear to auscultation               []  Other:   Heart:    [x]   Regular rhythm             []  irregular rhythm    [x]   Previous H&P reviewed, patient examined and there are NO CHANGES                 []   Previous H&P reviewed, patient examined and there are changes noted.   [name], MD 10/31/201611:36 AM

## 2015-09-29 NOTE — Transfer of Care (Signed)
Immediate Anesthesia Transfer of Care Note  Patient: Christian [name]  Procedure(s) Performed: ECT  Patient Location: PACU  Anesthesia Type:General  Level of Consciousness: sedated  Airway & Oxygen Therapy: Patient Spontanous Breathing and Patient connected to face mask oxygen  Post-op Assessment: Report given to RN and Post -op Vital signs reviewed and stable  Post vital signs: Reviewed and stable  Last Vitals:  Filed Vitals:   09/29/15 0920  BP: 158/98  Pulse: 93  Temp: 36.7 C  Resp: 18    Complications: No apparent anesthesia complications

## 2015-10-01 ENCOUNTER — Other Ambulatory Visit: Payer: Self-pay

## 2015-10-02 ENCOUNTER — Other Ambulatory Visit: Payer: Self-pay | Admitting: *Deleted

## 2015-10-02 DIAGNOSIS — G4733 Obstructive sleep apnea (adult) (pediatric): Secondary | ICD-10-CM | POA: Diagnosis not present

## 2015-10-02 DIAGNOSIS — E785 Hyperlipidemia, unspecified: Secondary | ICD-10-CM | POA: Diagnosis not present

## 2015-10-02 DIAGNOSIS — I482 Chronic atrial fibrillation: Secondary | ICD-10-CM | POA: Diagnosis not present

## 2015-10-02 DIAGNOSIS — I10 Essential (primary) hypertension: Secondary | ICD-10-CM | POA: Diagnosis not present

## 2015-10-02 DIAGNOSIS — F315 Bipolar disorder, current episode depressed, severe, with psychotic features: Secondary | ICD-10-CM | POA: Diagnosis not present

## 2015-10-02 DIAGNOSIS — E1169 Type 2 diabetes mellitus with other specified complication: Secondary | ICD-10-CM

## 2015-10-02 DIAGNOSIS — Z794 Long term (current) use of insulin: Secondary | ICD-10-CM | POA: Diagnosis not present

## 2015-10-02 DIAGNOSIS — M109 Gout, unspecified: Secondary | ICD-10-CM | POA: Diagnosis not present

## 2015-10-02 DIAGNOSIS — E119 Type 2 diabetes mellitus without complications: Secondary | ICD-10-CM | POA: Diagnosis not present

## 2015-10-02 DIAGNOSIS — F419 Anxiety disorder, unspecified: Secondary | ICD-10-CM | POA: Diagnosis not present

## 2015-10-02 NOTE — Patient Outreach (Signed)
[facility]) Care Management  10/02/2015  Christian [name] 07/26/1951 [id number]   Request from Jodi Mourning, RN to assign [facility], LCSW to follow while inpatient at [facility].  Thanks, Corrie Mckusick. Sharlee Blew [facility] Care Management [facility] CM Assistant Phone: [phone] Fax: [phone]

## 2015-10-06 ENCOUNTER — Encounter: Payer: Self-pay | Admitting: Anesthesiology

## 2015-10-06 ENCOUNTER — Encounter
Admission: RE | Admit: 2015-10-06 | Discharge: 2015-10-06 | Disposition: A | Discharge status: Home / Self Care | Payer: Commercial Managed Care - HMO | Source: Ambulatory Visit | Attending: Psychiatry | Admitting: Psychiatry

## 2015-10-06 ENCOUNTER — Other Ambulatory Visit: Payer: Self-pay | Admitting: *Deleted

## 2015-10-06 DIAGNOSIS — F315 Bipolar disorder, current episode depressed, severe, with psychotic features: Secondary | ICD-10-CM | POA: Diagnosis not present

## 2015-10-06 DIAGNOSIS — I10 Essential (primary) hypertension: Secondary | ICD-10-CM | POA: Diagnosis not present

## 2015-10-06 DIAGNOSIS — E785 Hyperlipidemia, unspecified: Secondary | ICD-10-CM | POA: Insufficient documentation

## 2015-10-06 DIAGNOSIS — G4733 Obstructive sleep apnea (adult) (pediatric): Secondary | ICD-10-CM | POA: Insufficient documentation

## 2015-10-06 DIAGNOSIS — M109 Gout, unspecified: Secondary | ICD-10-CM | POA: Insufficient documentation

## 2015-10-06 DIAGNOSIS — F319 Bipolar disorder, unspecified: Secondary | ICD-10-CM | POA: Insufficient documentation

## 2015-10-06 DIAGNOSIS — E119 Type 2 diabetes mellitus without complications: Secondary | ICD-10-CM | POA: Insufficient documentation

## 2015-10-06 DIAGNOSIS — R600 Localized edema: Secondary | ICD-10-CM | POA: Diagnosis not present

## 2015-10-06 DIAGNOSIS — I739 Peripheral vascular disease, unspecified: Secondary | ICD-10-CM | POA: Diagnosis not present

## 2015-10-06 LAB — GLUCOSE, CAPILLARY
Glucose-Capillary: 178 mg/dL — ABNORMAL HIGH (ref 65–99)
Glucose-Capillary: 170 mg/dL — ABNORMAL HIGH (ref 65–99)

## 2015-10-06 MED ORDER — LABETALOL HCL 5 MG/ML IV SOLN
20.0000 mg | Freq: Once | INTRAVENOUS | Status: AC
  Administered 2015-10-06: 20 mg via INTRAVENOUS

## 2015-10-06 MED ORDER — NITROGLYCERIN 1 MG/10 ML FOR IR/CATH LAB
100.0000 ug | Freq: Once | INTRA_ARTERIAL | Status: AC
  Administered 2015-10-06: 100 ug via INTRA_ARTERIAL
  Filled 2015-10-06: qty 10

## 2015-10-06 MED ORDER — ONDANSETRON HCL 4 MG/2ML IJ SOLN: 4.0000 mg | Freq: Once | INTRAMUSCULAR | Status: DC | PRN

## 2015-10-06 MED ORDER — SODIUM CHLORIDE 0.9 % IV SOLN
250.0000 mL | Freq: Once | INTRAVENOUS | Status: AC
  Administered 2015-10-06: 250 mL via INTRAVENOUS

## 2015-10-06 MED ORDER — LISINOPRIL 5 MG PO TABS
5.0000 mg | ORAL_TABLET | Freq: Once | ORAL | Status: AC
  Administered 2015-10-06: 5 mg via ORAL
  Filled 2015-10-06: qty 1

## 2015-10-06 MED ORDER — SUCCINYLCHOLINE CHLORIDE 20 MG/ML IJ SOLN
150.0000 mg | Freq: Once | INTRAMUSCULAR | Status: AC
  Administered 2015-10-06: 150 mg via INTRAVENOUS

## 2015-10-06 MED ORDER — LIDOCAINE HCL (CARDIAC) 20 MG/ML IV SOLN
4.0000 mg | Freq: Once | INTRAVENOUS | Status: AC
  Administered 2015-10-06: 4 mg via INTRAVENOUS

## 2015-10-06 MED ORDER — FENTANYL CITRATE (PF) 100 MCG/2ML IJ SOLN: 25.0000 ug | INTRAMUSCULAR | Status: DC | PRN

## 2015-10-06 MED ORDER — HYDRALAZINE HCL 20 MG/ML IJ SOLN
10.0000 mg | Freq: Once | INTRAMUSCULAR | Status: AC
  Administered 2015-10-06: 10 mg via INTRAVENOUS

## 2015-10-06 MED ORDER — NITROGLYCERIN 1 MG/10 ML FOR IR/CATH LAB
100.0000 ug | Freq: Once | INTRA_ARTERIAL | Status: AC
  Administered 2015-10-06: 100 ug via INTRA_ARTERIAL

## 2015-10-06 MED ORDER — KETAMINE HCL 10 MG/ML IJ SOLN
120.0000 mg | Freq: Once | INTRAMUSCULAR | Status: AC
Start: 2015-10-06 — End: 2015-10-06
  Administered 2015-10-06: 120 mg via INTRAVENOUS

## 2015-10-06 NOTE — Progress Notes (Signed)
1250 pt. In pacu from treatment room, blood pressure elevated.  Dr. Maisie Fus notified and verbal order given for  Labetalol.  Continue to monitor and give him the po lisinopril he did not take from home today.  Will keep Dr. Maisie Fus informed and notified of status.

## 2015-10-06 NOTE — Patient Outreach (Signed)
[facility]) Care Management  [facility] Social Work  10/06/2015  Christian [name] 09-02-1951 [id number]  Subjective:  Patient is a 64 year old male.  Objective:   Current Medications:  Current Outpatient Prescriptions  Medication Sig Dispense Refill  . allopurinol (ZYLOPRIM) 300 MG tablet Take 1 tablet (300 mg total) by mouth daily. 30 tablet 0  . apixaban (ELIQUIS) 5 MG TABS tablet Take 1 tablet (5 mg total) by mouth 2 (two) times daily. 60 tablet 0  . aspirin EC 81 MG EC tablet Take 1 tablet (81 mg total) by mouth daily. 30 tablet 0  . atorvastatin (LIPITOR) 10 MG tablet Take 1 tablet (10 mg total) by mouth daily at 6 PM. 30 tablet 0  . clonazePAM (KLONOPIN) 1 MG tablet Take 1 tablet (1 mg total) by mouth at bedtime. 30 tablet 0  . colchicine 0.6 MG tablet Take 1 tablet (0.6 mg total) by mouth 2 (two) times daily. 60 tablet 0  . docusate sodium (COLACE) 100 MG capsule Take 2 capsules (200 mg total) by mouth 2 (two) times daily. 60 capsule 0  . finasteride (PROSCAR) 5 MG tablet Take 1 tablet (5 mg total) by mouth daily. 30 tablet 0  . insulin aspart (NOVOLOG) 100 UNIT/ML injection Inject 4 Units into the skin 3 (three) times daily with meals. 10 mL 0  . insulin aspart (NOVOLOG) 100 UNIT/ML injection Inject 0-15 Units into the skin 3 (three) times daily with meals. 10 mL 0  . insulin aspart (NOVOLOG) 100 UNIT/ML injection Inject 0-5 Units into the skin at bedtime. 10 mL 0  . insulin glargine (LANTUS) 100 UNIT/ML injection Inject 0.15 mLs (15 Units total) into the skin at bedtime. 10 mL 0  . lisinopril (PRINIVIL,ZESTRIL) 5 MG tablet Take 1 tablet (5 mg total) by mouth daily. 30 tablet 0  . methylphenidate (RITALIN) 5 MG tablet Take 1 tablet (5 mg total) by mouth every morning. (Patient not taking: Reported on 10/06/2015) 30 tablet 0  . midodrine (PROAMATINE) 5 MG tablet Take 1 tablet (5 mg total) by mouth 3 (three) times daily with meals. 90 tablet 0  . mirtazapine (REMERON)  45 MG tablet Take 1 tablet (45 mg total) by mouth at bedtime. 30 tablet 0  . OLANZapine (ZYPREXA) 15 MG tablet Take 2 tablets (30 mg total) by mouth at bedtime. 60 tablet 0  . polyethylene glycol (MIRALAX / GLYCOLAX) packet Take 17 g by mouth daily. 30 each 0  . tamsulosin (FLOMAX) 0.4 MG CAPS capsule Take 1 capsule (0.4 mg total) by mouth daily after supper. 30 capsule 0   No current facility-administered medications for this visit.   Facility-Administered Medications Ordered in Other Visits  Medication Dose Route Frequency Provider Last Rate Last Dose  . fentaNYL (SUBLIMAZE) injection 25 mcg  25 mcg Intravenous Q5 min PRN Berdine Addison, MD      . ondansetron [facility]) injection 4 mg  4 mg Intravenous Once PRN Berdine Addison, MD        Functional Status:  In your present state of health, do you have any difficulty performing the following activities: 09/05/2015 08/31/2015  Hearing? N N  Vision? N N  Difficulty concentrating or making decisions? Y N  Walking or climbing stairs? N N  Dressing or bathing? Y N  Doing errands, shopping? N N    Fall/Depression Screening:  No flowsheet data found.  Assessment:  This social worker went to [employer] to follow up with patient(referrral received 10/02/15), however  was told that patient was discharged on 09/29/15.  RNCM informed for transition of care.  Plan: This social worker will contact patient by phone to assess for social work needs.     [name] [facility] Care Management [phone]

## 2015-10-06 NOTE — Anesthesia Postprocedure Evaluation (Signed)
  Anesthesia Post-op Note  Patient: Christian [name]  Procedure(s) Performed: * No procedures listed *  Anesthesia type:General  Patient location: PACU  Post pain: Pain level controlled  Post assessment: Post-op Vital signs reviewed, Patient's Cardiovascular Status Stable, Respiratory Function Stable, Patent Airway and No signs of Nausea or vomiting  Post vital signs: Reviewed and stable  Last Vitals:  Filed Vitals:   10/06/15 1348  BP: 164/107  Pulse: 102  Temp:   Resp: 17    Level of consciousness: awake, alert  and patient cooperative  Complications: No apparent anesthesia complications Pt. Hypertensive - treated Ok now.

## 2015-10-06 NOTE — Anesthesia Preprocedure Evaluation (Signed)
Anesthesia Evaluation  Patient identified by MRN, date of birth, ID band Patient awake and Patient confused    Reviewed: Allergy & Precautions, NPO status , Patient's Chart, lab work & pertinent test results, reviewed documented beta blocker date and time   Airway Mallampati: III       Dental  (+) Edentulous Upper, Edentulous Lower   Pulmonary shortness of breath, sleep apnea ,     + decreased breath sounds      Cardiovascular hypertension, Pt. on medications + Peripheral Vascular Disease and + DVT  Normal cardiovascular exam     Neuro/Psych Depression Bipolar Disorder    GI/Hepatic negative GI ROS, Neg liver ROS,   Endo/Other  diabetes, Type 1, Insulin Dependent  Renal/GU      Musculoskeletal   Abdominal (+) + obese,   Peds  Hematology   Anesthesia Other Findings   Reproductive/Obstetrics                             Anesthesia Physical Anesthesia Plan  ASA: III  Anesthesia Plan: General   Post-op Pain Management:    Induction: Intravenous  Airway Management Planned: Mask  Additional Equipment:   Intra-op Plan:   Post-operative Plan:   Informed Consent: I have reviewed the patients History and Physical, chart, labs and discussed the procedure including the risks, benefits and alternatives for the proposed anesthesia with the patient or authorized representative who has indicated his/her understanding and acceptance.     Plan Discussed with: CRNA  Anesthesia Plan Comments:         Anesthesia Quick Evaluation

## 2015-10-06 NOTE — Procedures (Signed)
ECT SERVICES Physician's Interval Evaluation & Treatment Note  Patient Identification: Christian [name] MRN:  [id number] Date of Evaluation:  10/06/2015 TX #: [id number]  MADRS:   MMSE:   P.E. Findings:  Physically he is feeling stronger walking better  Psychiatric Interval Note:  Mood is gradually getting better.  Subjective:  Patient is a 64 y.o. male seen for evaluation for Electroconvulsive Therapy. No specific new complaints  Treatment Summary:   []   Right Unilateral             [x]  Bilateral   % Energy : 1.0 ms, 100%   Impedance: 640 ohms  Seizure Energy Index: 2401 V squared  Postictal Suppression Index: 23%  Seizure Concordance Index: 99%  Medications  Pre Shock: Xylocaine 4 mg, labetalol 20 mg, ketamine 120 mg, succinylcholine 150 mg, nitroglycerin 100 g  Post Shock: Nitroglycerin 100 g, hydralazine 10 mg, labetalol 20 mg  Seizure Duration: 37 seconds by EMG, 67 seconds by EEG   Comments: Follow-up 2 weeks   Lungs:  [x]   Clear to auscultation               []  Other:   Heart:    [x]   Regular rhythm             []  irregular rhythm    [x]   Previous H&P reviewed, patient examined and there are NO CHANGES                 []   Previous H&P reviewed, patient examined and there are changes noted.   [name], MD 11/7/201612:20 PM

## 2015-10-06 NOTE — Progress Notes (Signed)
Dr. Maisie Fus aware bp still elevated , wants to give po lisinopril as initially advised.  Pt. Is awake and states he Did not take his lisinopril this morning because he was not sure if he needed to take.

## 2015-10-06 NOTE — Transfer of Care (Signed)
Immediate Anesthesia Transfer of Care Note  Patient: Christian [name]  Procedure(s) Performed: ECT  Patient Location: PACU  Anesthesia Type:General  Level of Consciousness: sedated  Airway & Oxygen Therapy: Patient Spontanous Breathing and Patient connected to face mask oxygen  Post-op Assessment: Report given to RN  Post vital signs: Reviewed  Last Vitals:  Filed Vitals:   10/06/15 1236  BP: 202/132; Dr Maisie Fus aware of BP; labetolol ordered for PACU to administer  Pulse: 107  Resp: 25    Complications: No apparent anesthesia complications

## 2015-10-06 NOTE — Anesthesia Procedure Notes (Signed)
Date/Time: 10/06/2015 12:28 PM Performed by: Lily Kocher Pre-anesthesia Checklist: Patient identified, Emergency Drugs available, Suction available and Patient being monitored Patient Re-evaluated:Patient Re-evaluated prior to inductionOxygen Delivery Method: Circle system utilized Preoxygenation: Pre-oxygenation with 100% oxygen Intubation Type: IV induction Ventilation: Mask ventilation without difficulty and Mask ventilation throughout procedure Airway Equipment and Method: Bite block Placement Confirmation: positive ETCO2 Dental Injury: Teeth and Oropharynx as per pre-operative assessment

## 2015-10-06 NOTE — H&P (Signed)
Christian [name] is an 65 y.o. male.   Chief Complaint: Patient receiving ongoing ECT now moving into the maintenance phase. No specific new complaints HPI: History of severe depression as part of bipolar disorder currently recovering  Past Medical History  Diagnosis Date  . Mood swings (HCC)   . Type II or unspecified type diabetes mellitus with unspecified complication, uncontrolled 06/02/2014  . Other and unspecified hyperlipidemia 06/02/2014  . OSA on CPAP 06/02/2014  . 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 Bipolar disorder (HCC)   . Hypertension   . Gout   . Bipolar 1 disorder (HCC)   . Hx of blood clots     History reviewed. No pertinent past surgical history.  Family History  Problem Relation Age of Onset  . Dementia Mother   . Arthritis Father   . Heart failure Neg Hx   . Kidney failure Neg Hx   . Cancer Neg Hx    Social History:  reports that he has never smoked. He does not have any smokeless tobacco history on file. He reports that he does not drink alcohol or use illicit drugs.  Allergies: No Known Allergies   (Not in a hospital admission)  Results for orders placed or performed during the hospital encounter of 10/06/15 (from the past 48 hour(s))  Glucose, capillary     Status: Abnormal   Collection Time: 10/06/15 10:03 AM  Result Value Ref Range   Glucose-Capillary 178 (H) 65 - 99 mg/dL   Comment 1 Notify RN    No results found.  Review of Systems  Constitutional: Positive for malaise/fatigue.  HENT: Negative.   Eyes: Negative.   Respiratory: Negative.   Cardiovascular: Negative.   Gastrointestinal: Negative.   Musculoskeletal: Negative.   Skin: Negative.   Neurological: Negative.   Psychiatric/Behavioral: Negative for depression, suicidal ideas, hallucinations, memory loss and substance abuse. The patient is not nervous/anxious and does not have insomnia.     Blood pressure 147/97, pulse 100, resp. rate 18,  height 6' (1.829 m), weight 109.317 kg (241 lb), SpO2 100 %. Physical Exam  Nursing note and vitals reviewed. Constitutional: He appears well-developed and well-nourished.  HENT:  Head: Normocephalic and atraumatic.  Eyes: Conjunctivae are normal. Pupils are equal, round, and reactive to light.  Neck: Normal range of motion.  Cardiovascular: Regular rhythm and normal heart sounds.   Respiratory: Effort normal and breath sounds normal. No respiratory distress. He has no wheezes.  GI: Soft.  Musculoskeletal: Normal range of motion.  Neurological: He is alert.  Skin: Skin is warm and dry.  Psychiatric: He has a normal mood and affect. His behavior is normal. Judgment and thought content normal.     Assessment/Plan Follow-up next treatment in 2 weeks on November 21  [name] 10/06/2015, 12:17 PM

## 2015-10-06 NOTE — Progress Notes (Signed)
Dr. Maisie Fus aware of 164/107 blood pressure, no further orders.  Patient should be able to continue on To area for discharge.  Pt. Awake and oriented.  States he feels ok.  Po liquids given and tolerated well.

## 2015-10-06 NOTE — Hospital Discharge Follow-Up (Signed)
Pt discharged via w/c with wife.  Wife informed that pts blood pressure had been up during procedure and recovery.  Informed wife to  inform PCP at appointment on Wednesday 10/08/15 of increased blood pressure during admission to ECT and would need a referral to see a cardiologist as soon as possible.  Wife verbalized understanding of instructions given.  Informed wife lisinopril was given in recovery room.

## 2015-10-07 ENCOUNTER — Other Ambulatory Visit: Payer: Self-pay | Admitting: *Deleted

## 2015-10-07 ENCOUNTER — Encounter: Payer: Self-pay | Admitting: *Deleted

## 2015-10-07 NOTE — Patient Outreach (Signed)
Transition of care call (f/u on discharge from SNF- this RN CM notified by [facility] on 11/7 attempt made to f/u with  pt at [facility]- was told discharged 10/31).   Spoke with spouse Gloriajean Dell who states pt is busy now, will call RN CM back, contact number provided.      [name].   Tona Qualley RN CCM [facility] Care Management  [phone]

## 2015-10-07 NOTE — Patient Outreach (Signed)
Transition of care call:  Received a return phone call from pt, HIPPA verified.   Pt reports reason for SNF stay was weak, could not walk, was in bed for 2 months.  Pt states he went from wheelchair to walker, now using a cane, feel stronger. Pt reports his BP is up a little, need to get a new BP machine.  Spouse who was present while pt was talking with RN CM reports pt saw Dr. Maisie Fus yesterday for ECT treatment- MD concerned because BP was elevated (159/106,147/104.  Spouse states pt is to f/u with Dr. Wynelle Link tomorrow, Dr. Maisie Fus provided pt with list of medications given to help bring BP down plus suggestion for pt to have referral to f/u with Heart MD.   Pt reports blood sugar today was 148, having  Difficulty understanding insulin pen used for sliding scale.  RN CM spoke with spouse on use of insulin pen, discussed how to tell when pen is empty to which spouse voiced understanding. Spouse reports nurse from [facility] came last week (opened pt's case)- pt to receive [facility] PT/OT/ RN services.    RN CM discussed with pt and spouse [facility] services, plan to f/u with weekly phone calls (4 weeks) as well as doing a home visit to which pt/spouse agreed.  Home visit scheduled for 11/10.    Plan to f/u with pt 11/10- home visit Plan to inform Dr. Wynelle Link of [facility] involvement.    [name].   Pierzchala RN CCM [facility] Care Management  [phone]

## 2015-10-08 ENCOUNTER — Other Ambulatory Visit: Payer: Self-pay | Admitting: *Deleted

## 2015-10-08 DIAGNOSIS — I509 Heart failure, unspecified: Secondary | ICD-10-CM | POA: Diagnosis not present

## 2015-10-08 DIAGNOSIS — E782 Mixed hyperlipidemia: Secondary | ICD-10-CM | POA: Diagnosis not present

## 2015-10-08 DIAGNOSIS — I110 Hypertensive heart disease with heart failure: Secondary | ICD-10-CM | POA: Diagnosis not present

## 2015-10-08 DIAGNOSIS — M7500 Adhesive capsulitis of unspecified shoulder: Secondary | ICD-10-CM | POA: Diagnosis not present

## 2015-10-08 DIAGNOSIS — I2699 Other pulmonary embolism without acute cor pulmonale: Secondary | ICD-10-CM | POA: Diagnosis not present

## 2015-10-08 DIAGNOSIS — F3131 Bipolar disorder, current episode depressed, mild: Secondary | ICD-10-CM | POA: Diagnosis not present

## 2015-10-08 DIAGNOSIS — N182 Chronic kidney disease, stage 2 (mild): Secondary | ICD-10-CM | POA: Diagnosis not present

## 2015-10-08 DIAGNOSIS — E1122 Type 2 diabetes mellitus with diabetic chronic kidney disease: Secondary | ICD-10-CM | POA: Diagnosis not present

## 2015-10-08 NOTE — Patient Outreach (Signed)
[facility]) Care Management  10/08/2015  Christian [name] 03-25-1951 [id number]   Phone call to patient to assess for social work needs.  Per patient's spouse, patient blood pressure has gone up after his ECT treatments.  Next treatment scheduled for 10/20/15. Patient scheduled to see primary care doctor today at 4:15 and will discuss blood pressure medicines. Per patient's spouse, patient may be referred to a Cardiologist.  Patient would like to monitor his blood pressure, however does not have a blood pressure machine.  Went to [facility] yesterday to use [facility] machine.   Plan:  This Child psychotherapist will inform RNCM of need for blood pressure cuff            No further social work needs verbalized at this time.     [name] [facility] Care Management [phone]

## 2015-10-09 ENCOUNTER — Other Ambulatory Visit: Payer: Self-pay | Admitting: *Deleted

## 2015-10-09 ENCOUNTER — Encounter: Payer: Self-pay | Admitting: *Deleted

## 2015-10-09 NOTE — Patient Outreach (Signed)
[facility]) Care Management   10/09/2015 - Acute home visit (part of transition of care)  Christian [name] [birth year] [id number]  Christian [name] is an 64 y.o. male  Subjective:  Spouse reports saw Dr. Wynelle Link yesterday, f/u after SNF.  Spouse states MD did some med changes, increased his Levemir to 20 units at night.  Spouse states pt's BP at MD office yesterday was 146/100, [facility] to [facility] BP, plan to buy BP machine  next week.   Pt states he has a problem with his right ear for a couple weeks now,annoying muffle sound/ messes with his balance.   Spouse states pt was not using his CPAP machine while in [employer], plan to have pt go back on it (been using 15- 20 years).    Objective:   Filed Vitals:   10/09/15 1020  BP: 140/98  Pulse: 104  Resp: 20    ROS  Physical Exam  Constitutional: He is oriented to person, place, and time. He appears well-developed and well-nourished.  Cardiovascular: Regular rhythm.   Heart rate at rest 104  Respiratory: Effort normal and breath sounds normal.  GI: Soft.  Musculoskeletal: He exhibits edema.  Uses cane when ambulating.  2+ edema - right foot/ankle, 2-3 + edema in left foot/ankle    Neurological: He is alert and oriented to person, place, and time.  Skin: Skin is warm and dry.  Psychiatric: He has a normal mood and affect. His behavior is normal. Thought content normal.    Current Medications:  Medication review done with spouse/pt.  Current Outpatient Prescriptions  Medication Sig Dispense Refill  . allopurinol (ZYLOPRIM) 300 MG tablet Take 1 tablet (300 mg total) by mouth daily. 30 tablet 0  . apixaban (ELIQUIS) 5 MG TABS tablet Take 1 tablet (5 mg total) by mouth 2 (two) times daily. 60 tablet 0  . aspirin EC 81 MG EC tablet Take 1 tablet (81 mg total) by mouth daily. 30 tablet 0  . atorvastatin (LIPITOR) 10 MG tablet Take 1 tablet (10 mg total) by mouth daily at 6 PM. 30 tablet 0  .  clonazePAM (KLONOPIN) 1 MG tablet Take 1 tablet (1 mg total) by mouth at bedtime. 30 tablet 0  . colchicine 0.6 MG tablet Take 1 tablet (0.6 mg total) by mouth 2 (two) times daily. 60 tablet 0  . finasteride (PROSCAR) 5 MG tablet Take 1 tablet (5 mg total) by mouth daily. 30 tablet 0  . insulin aspart (NOVOLOG) 100 UNIT/ML injection Inject 4 Units into the skin 3 (three) times daily with meals. 10 mL 0  . insulin aspart (NOVOLOG) 100 UNIT/ML injection Inject 0-15 Units into the skin 3 (three) times daily with meals. 10 mL 0  . insulin aspart (NOVOLOG) 100 UNIT/ML injection Inject 0-5 Units into the skin at bedtime. 10 mL 0  . insulin detemir (LEVEMIR) 100 UNIT/ML injection Inject 17 Units into the skin at bedtime.    Marland Kitchen lisinopril (PRINIVIL,ZESTRIL) 5 MG tablet Take 1 tablet (5 mg total) by mouth daily. 30 tablet 0  . mirtazapine (REMERON) 45 MG tablet Take 1 tablet (45 mg total) by mouth at bedtime. 30 tablet 0  . OLANZapine (ZYPREXA) 15 MG tablet Take 2 tablets (30 mg total) by mouth at bedtime. 60 tablet 0  . tamsulosin (FLOMAX) 0.4 MG CAPS capsule Take 1 capsule (0.4 mg total) by mouth daily after supper. 30 capsule 0  . docusate sodium (COLACE) 100 MG  capsule Take 2 capsules (200 mg total) by mouth 2 (two) times daily. (Patient not taking: Reported on 10/09/2015) 60 capsule 0  . insulin glargine (LANTUS) 100 UNIT/ML injection Inject 0.15 mLs (15 Units total) into the skin at bedtime. (Patient not taking: Reported on 10/07/2015) 10 mL 0  . methylphenidate (RITALIN) 5 MG tablet Take 1 tablet (5 mg total) by mouth every morning. (Patient not taking: Reported on 10/06/2015) 30 tablet 0  . midodrine (PROAMATINE) 5 MG tablet Take 1 tablet (5 mg total) by mouth 3 (three) times daily with meals. (Patient not taking: Reported on 10/09/2015) 90 tablet 0  . polyethylene glycol (MIRALAX / GLYCOLAX) packet Take 17 g by mouth daily. (Patient not taking: Reported on 10/07/2015) 30 each 0   No current  facility-administered medications for this visit.    Functional Status:   In your present state of health, do you have any difficulty performing the following activities: 10/09/2015 07/11/2015  Hearing? N N  Vision? N N  Difficulty concentrating or making decisions? N Y  Walking or climbing stairs? Y Y  Dressing or bathing? Y Y  Doing errands, shopping? Malvin Johns  Preparing Food and eating ? Y -  Using the Toilet? N -  In the past six months, have you accidently leaked urine? N -  Do you have problems with loss of bowel control? N -  Managing your Medications? N -  Managing your Finances? Y -  Housekeeping or managing your Housekeeping? Y -  Some encounter information is confidential and restricted. Go to Review Flowsheets activity to see all data.    Fall/Depression Screening:    PHQ 2/9 Scores 10/09/2015  PHQ - 2 Score 2  PHQ- 9 Score 8    Assessment:   DM- view of pt's glucometer readings= 189 today, ranges 148-295 am/ 190-302 pm.   Provided spouse with information on My Plate, carbohydrate counting.                           HTN:  Pt's BP today 140/98, with pt's BP machine provided by RN CM- 143/101 (pt ook am medications during home visit.)  Need for pt to monitor BP daily, record, call MD if elevated.                           Edema-  2+ in right foot/ankle, 2-3 + in left foot/ankle.     Plan:   Pt to continue to check sugars 3 times a day, record,  Bring readings to next MD appointment.               Pt to be compliant with diabetic diet- RN CM provided information on carbohydrate counting, My plate.              Spouse to check pt's BP daily (different times of the day),record, call MD if elevated.              Edema- pt to elevate feet, monitor salt intake, call MD if edema does not go down,gets worse.               RN CM to continue to follow pt for transition of care- f/u again telephonically 11/17.              RN CM to fax  Dr. Wynelle Link 11/10 encounter, letter of involvement.  RN CM to call  Dr. Chase Caller nurse- discuss letting MD know about pt's elevated BP, edema in both feet.   THN CM Care Plan Problem One        Most Recent Value   Care Plan Problem One  Risk for readmission- recent discharge from SNF    Role Documenting the Problem One  Care Management Coordinator   Care Plan for Problem One  Active   THN Long Term Goal (31-90 days)  Pt would not readmit to SNF in the next 31 days    THN Long Term Goal Start Date  10/07/15   Interventions for Problem One Long Term Goal  RN CM to provide weekly phone calls x4, home visit scheduled 11/10, reviewed discharge medications    THN CM Short Term Goal #1 (0-30 days)  Pt's sugars would be within norm in the next 30 days    THN CM Short Term Goal #1 Start Date  10/07/15   Interventions for Short Term Goal #1  Discussed with spouse use of insulin pen, pt to continue to take insulin as ordered.    THN CM Short Term Goal #2 (0-30 days)  pt's BP would be within norm within the next 30 days    THN CM Short Term Goal #2 Start Date  10/07/15   Interventions for Short Term Goal #2  Provided pt with BP machine, demonstrated to spouse how to use.   Discussed with spouse checking pt's  BP daily at different times, record, report high readings to MD.       [name].   Pierzchala RN CCM [facility] Care Management  [phone]

## 2015-10-10 DIAGNOSIS — M109 Gout, unspecified: Secondary | ICD-10-CM | POA: Diagnosis not present

## 2015-10-10 DIAGNOSIS — F315 Bipolar disorder, current episode depressed, severe, with psychotic features: Secondary | ICD-10-CM | POA: Diagnosis not present

## 2015-10-10 DIAGNOSIS — I10 Essential (primary) hypertension: Secondary | ICD-10-CM | POA: Diagnosis not present

## 2015-10-10 DIAGNOSIS — Z794 Long term (current) use of insulin: Secondary | ICD-10-CM | POA: Diagnosis not present

## 2015-10-10 DIAGNOSIS — F419 Anxiety disorder, unspecified: Secondary | ICD-10-CM | POA: Diagnosis not present

## 2015-10-10 DIAGNOSIS — G4733 Obstructive sleep apnea (adult) (pediatric): Secondary | ICD-10-CM | POA: Diagnosis not present

## 2015-10-10 DIAGNOSIS — E119 Type 2 diabetes mellitus without complications: Secondary | ICD-10-CM | POA: Diagnosis not present

## 2015-10-10 DIAGNOSIS — E785 Hyperlipidemia, unspecified: Secondary | ICD-10-CM | POA: Diagnosis not present

## 2015-10-10 DIAGNOSIS — I482 Chronic atrial fibrillation: Secondary | ICD-10-CM | POA: Diagnosis not present

## 2015-10-14 DIAGNOSIS — I10 Essential (primary) hypertension: Secondary | ICD-10-CM | POA: Diagnosis not present

## 2015-10-14 DIAGNOSIS — F419 Anxiety disorder, unspecified: Secondary | ICD-10-CM | POA: Diagnosis not present

## 2015-10-14 DIAGNOSIS — I482 Chronic atrial fibrillation: Secondary | ICD-10-CM | POA: Diagnosis not present

## 2015-10-14 DIAGNOSIS — F315 Bipolar disorder, current episode depressed, severe, with psychotic features: Secondary | ICD-10-CM | POA: Diagnosis not present

## 2015-10-14 DIAGNOSIS — E785 Hyperlipidemia, unspecified: Secondary | ICD-10-CM | POA: Diagnosis not present

## 2015-10-14 DIAGNOSIS — E119 Type 2 diabetes mellitus without complications: Secondary | ICD-10-CM | POA: Diagnosis not present

## 2015-10-14 DIAGNOSIS — Z794 Long term (current) use of insulin: Secondary | ICD-10-CM | POA: Diagnosis not present

## 2015-10-14 DIAGNOSIS — M109 Gout, unspecified: Secondary | ICD-10-CM | POA: Diagnosis not present

## 2015-10-14 DIAGNOSIS — G4733 Obstructive sleep apnea (adult) (pediatric): Secondary | ICD-10-CM | POA: Diagnosis not present

## 2015-10-15 DIAGNOSIS — E785 Hyperlipidemia, unspecified: Secondary | ICD-10-CM | POA: Diagnosis not present

## 2015-10-15 DIAGNOSIS — E119 Type 2 diabetes mellitus without complications: Secondary | ICD-10-CM | POA: Diagnosis not present

## 2015-10-15 DIAGNOSIS — M75122 Complete rotator cuff tear or rupture of left shoulder, not specified as traumatic: Secondary | ICD-10-CM | POA: Diagnosis not present

## 2015-10-15 DIAGNOSIS — M109 Gout, unspecified: Secondary | ICD-10-CM | POA: Diagnosis not present

## 2015-10-15 DIAGNOSIS — G4733 Obstructive sleep apnea (adult) (pediatric): Secondary | ICD-10-CM | POA: Diagnosis not present

## 2015-10-15 DIAGNOSIS — I482 Chronic atrial fibrillation: Secondary | ICD-10-CM | POA: Diagnosis not present

## 2015-10-15 DIAGNOSIS — Z794 Long term (current) use of insulin: Secondary | ICD-10-CM | POA: Diagnosis not present

## 2015-10-15 DIAGNOSIS — F419 Anxiety disorder, unspecified: Secondary | ICD-10-CM | POA: Diagnosis not present

## 2015-10-15 DIAGNOSIS — F315 Bipolar disorder, current episode depressed, severe, with psychotic features: Secondary | ICD-10-CM | POA: Diagnosis not present

## 2015-10-15 DIAGNOSIS — M75121 Complete rotator cuff tear or rupture of right shoulder, not specified as traumatic: Secondary | ICD-10-CM | POA: Diagnosis not present

## 2015-10-15 DIAGNOSIS — I10 Essential (primary) hypertension: Secondary | ICD-10-CM | POA: Diagnosis not present

## 2015-10-16 ENCOUNTER — Other Ambulatory Visit: Payer: Self-pay | Admitting: *Deleted

## 2015-10-16 NOTE — Patient Outreach (Signed)
Transition of care call (week 2):  Pt reports doing good, BP up and down.  Pt states his sugar was elevated yesterday, 179 this morning.  Pt states he ate and rechecked sugar, result 250.  Pt states he is back on his CPAP machine, sleeping better.   Pt reports on BP readings- today 163/105, yesterday 185/124, others= 167/105,163/105,174/105,174/103,171/116, 155/103,165/100. 620.35,597/41.  Spouse who was also on the phone states pt is checking his BP 2-3 times (one sitting), not sure if accurate to which RN CM discussed with pt if BP high, wait 5-10 minutes before checking it again to which pt agreed to do.   Spouse reports called MD, told to have pt  continue to take Furosemide 20 mg daily (rx called in as a result of 11/9 office visit).   Spouse reports  pt still has swelling in feet, left bigger than right, not seeing a difference in swelling coming down since pt started Furosemide.   Spouse reports she did not let MD know pt's BP still elevated, no change is swelling in feet.   Spouse did say called MD office yesterday as MD took pt off Colchicine, still on Allopurinol.   But the colcochine works better- still have not heard back yet.    RN CM discussed with spouse when MD office calls back to let them know BP still high (risk for stroke) no change in swelling in feet to which spouse said she would.    RN CM requested spouse call her back to let her know what MD said to which spouse agreed.     Plan to f/u again telephonically 11/23 as part of ongoing transition of care.    [name].   Mahalie Kanner RN CCM [facility] Care Management  [phone]

## 2015-10-17 ENCOUNTER — Other Ambulatory Visit: Payer: Self-pay | Admitting: *Deleted

## 2015-10-17 DIAGNOSIS — M109 Gout, unspecified: Secondary | ICD-10-CM | POA: Diagnosis not present

## 2015-10-17 DIAGNOSIS — F315 Bipolar disorder, current episode depressed, severe, with psychotic features: Secondary | ICD-10-CM | POA: Diagnosis not present

## 2015-10-17 DIAGNOSIS — E119 Type 2 diabetes mellitus without complications: Secondary | ICD-10-CM | POA: Diagnosis not present

## 2015-10-17 DIAGNOSIS — F419 Anxiety disorder, unspecified: Secondary | ICD-10-CM | POA: Diagnosis not present

## 2015-10-17 DIAGNOSIS — G4733 Obstructive sleep apnea (adult) (pediatric): Secondary | ICD-10-CM | POA: Diagnosis not present

## 2015-10-17 DIAGNOSIS — I482 Chronic atrial fibrillation: Secondary | ICD-10-CM | POA: Diagnosis not present

## 2015-10-17 DIAGNOSIS — E785 Hyperlipidemia, unspecified: Secondary | ICD-10-CM | POA: Diagnosis not present

## 2015-10-17 DIAGNOSIS — Z794 Long term (current) use of insulin: Secondary | ICD-10-CM | POA: Diagnosis not present

## 2015-10-17 DIAGNOSIS — I10 Essential (primary) hypertension: Secondary | ICD-10-CM | POA: Diagnosis not present

## 2015-10-17 NOTE — Patient Outreach (Signed)
Follow up phone call:  Spoke with spouse Gloriajean Dell (on [facility] consent form) who reports  talked to MD  about pt's high BP, called back today.   Spouse states  MD increased pt's  Lisinopril from 5 mg to 10 mg daily, started increased dose today.  Spouse also reports MD took pt off the Allopurinol, put him  Back on Colchicine.  Spouse reports  HH PT took pt's BP this am, result was 158/99, not too long after pt checked with his BP  machine, result was 178/107 (this was before taking increased dose of Lisinopril).   RN CM discussed  With spouse pt continuing to monitor BP, take medications as ordered, diet compliance and if BP remains elevated to call MD back.     Plan to f/u with pt again telephonically 11/23 as part of ongoing transition of care. Medication profile updated.    [name].   Enrrique Mierzwa RN CCM [facility] Care Management  [phone]

## 2015-10-20 DIAGNOSIS — M109 Gout, unspecified: Secondary | ICD-10-CM | POA: Diagnosis not present

## 2015-10-20 DIAGNOSIS — E785 Hyperlipidemia, unspecified: Secondary | ICD-10-CM | POA: Diagnosis not present

## 2015-10-20 DIAGNOSIS — I482 Chronic atrial fibrillation: Secondary | ICD-10-CM | POA: Diagnosis not present

## 2015-10-20 DIAGNOSIS — F419 Anxiety disorder, unspecified: Secondary | ICD-10-CM | POA: Diagnosis not present

## 2015-10-20 DIAGNOSIS — I10 Essential (primary) hypertension: Secondary | ICD-10-CM | POA: Diagnosis not present

## 2015-10-20 DIAGNOSIS — E119 Type 2 diabetes mellitus without complications: Secondary | ICD-10-CM | POA: Diagnosis not present

## 2015-10-20 DIAGNOSIS — G4733 Obstructive sleep apnea (adult) (pediatric): Secondary | ICD-10-CM | POA: Diagnosis not present

## 2015-10-20 DIAGNOSIS — Z794 Long term (current) use of insulin: Secondary | ICD-10-CM | POA: Diagnosis not present

## 2015-10-20 DIAGNOSIS — F315 Bipolar disorder, current episode depressed, severe, with psychotic features: Secondary | ICD-10-CM | POA: Diagnosis not present

## 2015-10-21 DIAGNOSIS — Z794 Long term (current) use of insulin: Secondary | ICD-10-CM | POA: Diagnosis not present

## 2015-10-21 DIAGNOSIS — E785 Hyperlipidemia, unspecified: Secondary | ICD-10-CM | POA: Diagnosis not present

## 2015-10-21 DIAGNOSIS — E119 Type 2 diabetes mellitus without complications: Secondary | ICD-10-CM | POA: Diagnosis not present

## 2015-10-21 DIAGNOSIS — M109 Gout, unspecified: Secondary | ICD-10-CM | POA: Diagnosis not present

## 2015-10-21 DIAGNOSIS — F419 Anxiety disorder, unspecified: Secondary | ICD-10-CM | POA: Diagnosis not present

## 2015-10-21 DIAGNOSIS — G4733 Obstructive sleep apnea (adult) (pediatric): Secondary | ICD-10-CM | POA: Diagnosis not present

## 2015-10-21 DIAGNOSIS — I10 Essential (primary) hypertension: Secondary | ICD-10-CM | POA: Diagnosis not present

## 2015-10-21 DIAGNOSIS — F315 Bipolar disorder, current episode depressed, severe, with psychotic features: Secondary | ICD-10-CM | POA: Diagnosis not present

## 2015-10-21 DIAGNOSIS — I482 Chronic atrial fibrillation: Secondary | ICD-10-CM | POA: Diagnosis not present

## 2015-10-22 ENCOUNTER — Other Ambulatory Visit: Payer: Self-pay | Admitting: *Deleted

## 2015-10-22 DIAGNOSIS — M109 Gout, unspecified: Secondary | ICD-10-CM | POA: Diagnosis not present

## 2015-10-22 DIAGNOSIS — Z794 Long term (current) use of insulin: Secondary | ICD-10-CM | POA: Diagnosis not present

## 2015-10-22 DIAGNOSIS — F419 Anxiety disorder, unspecified: Secondary | ICD-10-CM | POA: Diagnosis not present

## 2015-10-22 DIAGNOSIS — E785 Hyperlipidemia, unspecified: Secondary | ICD-10-CM | POA: Diagnosis not present

## 2015-10-22 DIAGNOSIS — E119 Type 2 diabetes mellitus without complications: Secondary | ICD-10-CM | POA: Diagnosis not present

## 2015-10-22 DIAGNOSIS — F315 Bipolar disorder, current episode depressed, severe, with psychotic features: Secondary | ICD-10-CM | POA: Diagnosis not present

## 2015-10-22 DIAGNOSIS — I482 Chronic atrial fibrillation: Secondary | ICD-10-CM | POA: Diagnosis not present

## 2015-10-22 DIAGNOSIS — I10 Essential (primary) hypertension: Secondary | ICD-10-CM | POA: Diagnosis not present

## 2015-10-22 DIAGNOSIS — G4733 Obstructive sleep apnea (adult) (pediatric): Secondary | ICD-10-CM | POA: Diagnosis not present

## 2015-10-22 NOTE — Patient Outreach (Signed)
Transition of care call (week 3):  Spoke with both pt and spouse Gloriajean Dell on the phone today.   Pt reports BP is better, spouse states today was  143/95, last night 147/99.    Spouse states pt is also changing his diet.   Spouse reports  she has not had to call MD about high readings as MD increased his  Lisinopril, BP coming down slowly.   Spouse states pt's blood sugar today 171, yesterday 245, last night 251.   Discussed with pt monitoring his sodium and sugar during the holiday, having small portions to which pt voiced understanding.   Spouse states she is going to pick up a shower chair for pt.   Discussed with pt/spouse plan to f/u again telephonically 11/30 as part of ongoing transition of care.   [name].   Saivion Goettel RN CCM [facility] Care Management  [phone]

## 2015-10-27 ENCOUNTER — Encounter
Admission: RE | Admit: 2015-10-27 | Discharge: 2015-10-27 | Disposition: A | Discharge status: Home / Self Care | Payer: Commercial Managed Care - HMO | Source: Ambulatory Visit | Attending: Psychiatry | Admitting: Psychiatry

## 2015-10-27 ENCOUNTER — Encounter: Payer: Self-pay | Admitting: Anesthesiology

## 2015-10-27 DIAGNOSIS — F314 Bipolar disorder, current episode depressed, severe, without psychotic features: Secondary | ICD-10-CM | POA: Diagnosis not present

## 2015-10-27 DIAGNOSIS — I10 Essential (primary) hypertension: Secondary | ICD-10-CM | POA: Diagnosis not present

## 2015-10-27 DIAGNOSIS — G4733 Obstructive sleep apnea (adult) (pediatric): Secondary | ICD-10-CM | POA: Diagnosis not present

## 2015-10-27 DIAGNOSIS — R600 Localized edema: Secondary | ICD-10-CM | POA: Insufficient documentation

## 2015-10-27 DIAGNOSIS — E669 Obesity, unspecified: Secondary | ICD-10-CM | POA: Diagnosis not present

## 2015-10-27 DIAGNOSIS — E119 Type 2 diabetes mellitus without complications: Secondary | ICD-10-CM | POA: Diagnosis not present

## 2015-10-27 DIAGNOSIS — Z6834 Body mass index (BMI) 34.0-34.9, adult: Secondary | ICD-10-CM | POA: Diagnosis not present

## 2015-10-27 DIAGNOSIS — M109 Gout, unspecified: Secondary | ICD-10-CM | POA: Insufficient documentation

## 2015-10-27 DIAGNOSIS — F319 Bipolar disorder, unspecified: Secondary | ICD-10-CM | POA: Diagnosis not present

## 2015-10-27 DIAGNOSIS — E785 Hyperlipidemia, unspecified: Secondary | ICD-10-CM | POA: Insufficient documentation

## 2015-10-27 LAB — GLUCOSE, CAPILLARY
Glucose-Capillary: 218 mg/dL — ABNORMAL HIGH (ref 65–99)
Glucose-Capillary: 238 mg/dL — ABNORMAL HIGH (ref 65–99)

## 2015-10-27 MED ORDER — SUCCINYLCHOLINE CHLORIDE 20 MG/ML IJ SOLN
150.0000 mg | Freq: Once | INTRAMUSCULAR | Status: AC
  Administered 2015-10-27: 150 mg via INTRAVENOUS

## 2015-10-27 MED ORDER — METOPROLOL TARTRATE 1 MG/ML IV SOLN
5.0000 mg | Freq: Once | INTRAVENOUS | Status: AC
  Administered 2015-10-27: 5 mg via INTRAVENOUS

## 2015-10-27 MED ORDER — SODIUM CHLORIDE 0.9 % IV SOLN
250.0000 mL | Freq: Once | INTRAVENOUS | Status: AC
  Administered 2015-10-27: 250 mL via INTRAVENOUS

## 2015-10-27 MED ORDER — LABETALOL HCL 5 MG/ML IV SOLN
20.0000 mg | Freq: Once | INTRAVENOUS | Status: AC
  Administered 2015-10-27: 20 mg via INTRAVENOUS

## 2015-10-27 MED ORDER — KETAMINE HCL 10 MG/ML IJ SOLN
120.0000 mg | Freq: Once | INTRAMUSCULAR | Status: AC
  Administered 2015-10-27: 120 mg via INTRAVENOUS

## 2015-10-27 MED ORDER — LIDOCAINE HCL (CARDIAC) 20 MG/ML IV SOLN
4.0000 mg | Freq: Once | INTRAVENOUS | Status: AC
  Administered 2015-10-27: 4 mg via INTRAVENOUS

## 2015-10-27 NOTE — H&P (Signed)
Christian [name] is an 64 y.o. male.   Chief Complaint: Mood is much better. No suicidal thoughts. No psychosis. Physically feeling better more stable HPI: Patient returns for maintenance follow-up ECT for bipolar depression  Past Medical History  Diagnosis Date  . Mood swings (HCC)   . Type II or unspecified type diabetes mellitus with unspecified complication, uncontrolled 06/02/2014  . Other and unspecified hyperlipidemia 06/02/2014  . OSA on CPAP 06/02/2014  . 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 Bipolar disorder (HCC)   . Hypertension   . Gout   . Bipolar 1 disorder (HCC)   . Hx of blood clots     History reviewed. No pertinent past surgical history.  Family History  Problem Relation Age of Onset  . Dementia Mother   . Arthritis Father   . Heart failure Neg Hx   . Kidney failure Neg Hx   . Cancer Neg Hx    Social History:  reports that he has never smoked. He does not have any smokeless tobacco history on file. He reports that he does not drink alcohol or use illicit drugs.  Allergies: No Known Allergies   (Not in a hospital admission)  Results for orders placed or performed during the hospital encounter of 10/27/15 (from the past 48 hour(s))  Glucose, capillary     Status: Abnormal   Collection Time: 10/27/15  9:47 AM  Result Value Ref Range   Glucose-Capillary 218 (H) 65 - 99 mg/dL   No results found.  Review of Systems  Constitutional: Negative.   HENT: Negative.   Eyes: Negative.   Respiratory: Negative.   Cardiovascular: Negative.   Gastrointestinal: Negative.   Musculoskeletal: Negative.   Skin: Negative.   Neurological: Negative.   Psychiatric/Behavioral: Negative for depression, suicidal ideas, hallucinations, memory loss and substance abuse. The patient is not nervous/anxious and does not have insomnia.     Blood pressure 146/81, pulse 86, resp. rate 18, height 6' (1.829 m), weight 113.853 kg (251 lb), SpO2 100  %. Physical Exam  Nursing note and vitals reviewed. Constitutional: He appears well-developed and well-nourished.  HENT:  Head: Normocephalic and atraumatic.  Eyes: Conjunctivae are normal. Pupils are equal, round, and reactive to light.  Neck: Normal range of motion.  Cardiovascular: Normal rate, regular rhythm and normal heart sounds.   Respiratory: Effort normal and breath sounds normal. No respiratory distress.  GI: Soft.  Musculoskeletal: Normal range of motion.  Neurological: He is alert.  Skin: Skin is warm and dry.  Psychiatric: He has a normal mood and affect. His speech is normal and behavior is normal. Judgment and thought content normal. Cognition and memory are normal.     Assessment/Plan Follow-up to be scheduled again in 3 weeks. Patient agrees. Continue outpatient psychiatric treatment.  Calton Harshfield 10/27/2015, 11:14 AM

## 2015-10-27 NOTE — Anesthesia Postprocedure Evaluation (Signed)
Anesthesia Post Note  Patient: Christian [name]  Procedure(s) Performed: * No procedures listed *  Patient location during evaluation: PACU Anesthesia Type: General Level of consciousness: awake Pain management: pain level controlled Vital Signs Assessment: post-procedure vital signs reviewed and stable Respiratory status: spontaneous breathing Cardiovascular status: blood pressure returned to baseline Postop Assessment: No headache Anesthetic complications: no    Last Vitals:  Filed Vitals:   10/27/15 0916  BP: 146/81  Pulse: 86  Resp: 18    Last Pain:  Filed Vitals:   10/27/15 1139  PainSc: 5                  Archer Moist M

## 2015-10-27 NOTE — Progress Notes (Signed)
Total iv fluids  200

## 2015-10-27 NOTE — Procedures (Addendum)
ECT SERVICES Physician's Interval Evaluation & Treatment Note  Patient Identification: Christian [name] MRN:  [id number] Date of Evaluation:  10/27/2015 TX #: [id number]  MADRS: 12  MMSE: 30  P.E. Findings:  No change to physical exam. Blood pressure is improved.  Psychiatric Interval Note:  Mood is doing good. No new complaints. Stable no sign of mania either  Subjective:  Patient is a 65 y.o. male seen for evaluation for Electroconvulsive Therapy. Following up with maintenance treatment doing well also getting outpatient medication therapy  Treatment Summary:   []   Right Unilateral             []  Bilateral   % Energy : 1.0 ms 100%   Impedance: 370 ohms  Seizure Energy Index: 2494 V squared  Postictal Suppression Index: 41%  Seizure Concordance Index: 98%  Medications  Pre Shock: Xylocaine 4 mg labetalol 20 mg ketamine 120 mg succinylcholine 150 mg  Post Shock: Labetalol 20 mg  Seizure Duration: 38 seconds by EMG 70 seconds by EEG   Comments: Patient will follow-up in 3 weeks on December 19   Lungs:  [x]   Clear to auscultation               []  Other:   Heart:    [x]   Regular rhythm             []  irregular rhythm    [x]   Previous H&P reviewed, patient examined and there are NO CHANGES                 []   Previous H&P reviewed, patient examined and there are changes noted.   [name], MD 11/28/201611:17 AM

## 2015-10-27 NOTE — Transfer of Care (Signed)
Immediate Anesthesia Transfer of Care Note  Patient: Christian [name]  Procedure(s) Performed: ECT  Patient Location: PACU  Anesthesia Type:General  Level of Consciousness: sedated  Airway & Oxygen Therapy: Patient Spontanous Breathing and Patient connected to face mask oxygen  Post-op Assessment: Report given to RN  Post vital signs: Reviewed and stable  Last Vitals:  Filed Vitals:   10/27/15 0916  BP: 146/81  Pulse: 86  Resp: 18    Complications: No apparent anesthesia complications

## 2015-10-27 NOTE — Anesthesia Preprocedure Evaluation (Signed)
Anesthesia Evaluation  Patient identified by MRN, date of birth, ID band Patient awake    Reviewed: Allergy & Precautions, NPO status , Patient's Chart, lab work & pertinent test results  Airway Mallampati: III  TM Distance: <3 FB Neck ROM: Limited  Mouth opening: Limited Mouth Opening Comment: Looks difficult. Dental  (+) Teeth Intact   Pulmonary shortness of breath and with exertion, sleep apnea and Continuous Positive Airway Pressure Ventilation ,           Cardiovascular Exercise Tolerance: Poor hypertension, Pt. on medications Normal cardiovascular exam     Neuro/Psych    GI/Hepatic   Endo/Other  diabetes, Type 2BG 218.  Renal/GU      Musculoskeletal   Abdominal (+) + obese,  Abdomen: soft.    Peds  Hematology   Anesthesia Other Findings   Reproductive/Obstetrics                             Anesthesia Physical Anesthesia Plan  ASA: III  Anesthesia Plan: General   Post-op Pain Management:    Induction: Intravenous  Airway Management Planned: Mask  Additional Equipment:   Intra-op Plan:   Post-operative Plan:   Informed Consent: I have reviewed the patients History and Physical, chart, labs and discussed the procedure including the risks, benefits and alternatives for the proposed anesthesia with the patient or authorized representative who has indicated his/her understanding and acceptance.     Plan Discussed with: CRNA  Anesthesia Plan Comments:         Anesthesia Quick Evaluation

## 2015-10-27 NOTE — Anesthesia Procedure Notes (Signed)
Date/Time: 10/27/2015 11:23 AM Performed by: Lily Kocher Pre-anesthesia Checklist: Patient identified, Emergency Drugs available, Suction available and Patient being monitored Patient Re-evaluated:Patient Re-evaluated prior to inductionOxygen Delivery Method: Circle system utilized Preoxygenation: Pre-oxygenation with 100% oxygen Intubation Type: IV induction Ventilation: Mask ventilation without difficulty and Mask ventilation throughout procedure Airway Equipment and Method: Bite block Placement Confirmation: positive ETCO2 Dental Injury: Teeth and Oropharynx as per pre-operative assessment

## 2015-10-28 ENCOUNTER — Other Ambulatory Visit: Payer: Self-pay | Admitting: *Deleted

## 2015-10-28 DIAGNOSIS — G4733 Obstructive sleep apnea (adult) (pediatric): Secondary | ICD-10-CM | POA: Diagnosis not present

## 2015-10-28 DIAGNOSIS — F315 Bipolar disorder, current episode depressed, severe, with psychotic features: Secondary | ICD-10-CM | POA: Diagnosis not present

## 2015-10-28 DIAGNOSIS — I10 Essential (primary) hypertension: Secondary | ICD-10-CM | POA: Diagnosis not present

## 2015-10-28 DIAGNOSIS — E785 Hyperlipidemia, unspecified: Secondary | ICD-10-CM | POA: Diagnosis not present

## 2015-10-28 DIAGNOSIS — F419 Anxiety disorder, unspecified: Secondary | ICD-10-CM | POA: Diagnosis not present

## 2015-10-28 DIAGNOSIS — Z794 Long term (current) use of insulin: Secondary | ICD-10-CM | POA: Diagnosis not present

## 2015-10-28 DIAGNOSIS — E119 Type 2 diabetes mellitus without complications: Secondary | ICD-10-CM | POA: Diagnosis not present

## 2015-10-28 DIAGNOSIS — M109 Gout, unspecified: Secondary | ICD-10-CM | POA: Diagnosis not present

## 2015-10-28 DIAGNOSIS — I482 Chronic atrial fibrillation: Secondary | ICD-10-CM | POA: Diagnosis not present

## 2015-10-28 NOTE — Patient Outreach (Signed)
[facility]) Care Management  10/28/2015  Christian [name] [birth year] [id number]   Phone call to patient to assess for social work needs.  Patient's spouse stated that she would call later on today to discuss potential needs.    [name] [facility] Care Management [phone]

## 2015-10-29 ENCOUNTER — Other Ambulatory Visit: Payer: Self-pay | Admitting: *Deleted

## 2015-10-29 DIAGNOSIS — F419 Anxiety disorder, unspecified: Secondary | ICD-10-CM | POA: Diagnosis not present

## 2015-10-29 DIAGNOSIS — I482 Chronic atrial fibrillation: Secondary | ICD-10-CM | POA: Diagnosis not present

## 2015-10-29 DIAGNOSIS — I10 Essential (primary) hypertension: Secondary | ICD-10-CM | POA: Diagnosis not present

## 2015-10-29 DIAGNOSIS — G4733 Obstructive sleep apnea (adult) (pediatric): Secondary | ICD-10-CM | POA: Diagnosis not present

## 2015-10-29 DIAGNOSIS — E119 Type 2 diabetes mellitus without complications: Secondary | ICD-10-CM | POA: Diagnosis not present

## 2015-10-29 DIAGNOSIS — Z794 Long term (current) use of insulin: Secondary | ICD-10-CM | POA: Diagnosis not present

## 2015-10-29 DIAGNOSIS — F315 Bipolar disorder, current episode depressed, severe, with psychotic features: Secondary | ICD-10-CM | POA: Diagnosis not present

## 2015-10-29 DIAGNOSIS — M109 Gout, unspecified: Secondary | ICD-10-CM | POA: Diagnosis not present

## 2015-10-29 DIAGNOSIS — E785 Hyperlipidemia, unspecified: Secondary | ICD-10-CM | POA: Diagnosis not present

## 2015-10-29 NOTE — Patient Outreach (Signed)
Transition of care call:  Spoke with pt and spouse (spouse placed phone on speaker).  Pt reports BP doing much better - 159/89 today.  Pt states was running 180/101,180/102.   Spouse reports pt's BP was elevated with recent ECT treatment, called Dr. Wynelle Link who increased pt's Lisinopril yesterday to 20 mg daily.  Pt reports blood sugars were high today 208, 285 - it depends on what I eat.   Pt states last night it was 159,179.   Pt states spouse bought him some sugar free ice cream.  RN CM discussed with pt and spouse carbohydrate counting, to provide pt with more information at next home visit.  As discussed with pt and spouse, plan to f/u again 11/8- home visit.    [name].   Pierzchala RN CCM [facility] Care Management  [phone]

## 2015-10-30 DIAGNOSIS — E119 Type 2 diabetes mellitus without complications: Secondary | ICD-10-CM | POA: Diagnosis not present

## 2015-10-30 DIAGNOSIS — E785 Hyperlipidemia, unspecified: Secondary | ICD-10-CM | POA: Diagnosis not present

## 2015-10-30 DIAGNOSIS — F419 Anxiety disorder, unspecified: Secondary | ICD-10-CM | POA: Diagnosis not present

## 2015-10-30 DIAGNOSIS — Z794 Long term (current) use of insulin: Secondary | ICD-10-CM | POA: Diagnosis not present

## 2015-10-30 DIAGNOSIS — I10 Essential (primary) hypertension: Secondary | ICD-10-CM | POA: Diagnosis not present

## 2015-10-30 DIAGNOSIS — F315 Bipolar disorder, current episode depressed, severe, with psychotic features: Secondary | ICD-10-CM | POA: Diagnosis not present

## 2015-10-30 DIAGNOSIS — M109 Gout, unspecified: Secondary | ICD-10-CM | POA: Diagnosis not present

## 2015-10-30 DIAGNOSIS — G4733 Obstructive sleep apnea (adult) (pediatric): Secondary | ICD-10-CM | POA: Diagnosis not present

## 2015-10-30 DIAGNOSIS — I482 Chronic atrial fibrillation: Secondary | ICD-10-CM | POA: Diagnosis not present

## 2015-11-03 ENCOUNTER — Other Ambulatory Visit: Payer: Self-pay | Admitting: *Deleted

## 2015-11-03 DIAGNOSIS — Z794 Long term (current) use of insulin: Principal | ICD-10-CM

## 2015-11-03 DIAGNOSIS — E119 Type 2 diabetes mellitus without complications: Secondary | ICD-10-CM

## 2015-11-03 NOTE — Patient Outreach (Addendum)
[facility]) Care Management  11/03/2015  Christian [name] May 18, 1951 [id number]   Phone call to patient and patient's spouse to assess for continued social work involvement post discharge from the skilled nursing facility.  Spoke with patient's spouse who verbalized no social work needs, however did discuss having  difficulty affording patient's medications. Patient continues to receive ECT treatments, now only going every 3 weeks due to progress made.  Overall mood has improved, family is close by and will probobly get together for dinner over the holidays.  Patient continues to se Psychiatrist  Emerson Monte, MD however she does not take [facility].  Per patient's spouse she will check with patient's insurance to identify an in network provider.  Patient's spouse confident that she will be able to handle this on her own by calling the [facility] customer service number and does not need assistance.  Patient's spouse discussed difficulty affording patient's medication, specifically Eloquis which was quoted to be about $400.00, patient's Levemir approximately $195.00 and novolog approximately $47.00.     Plan:  This Child psychotherapist will refer patient to pharmacy for possible prescription assistance programs that patient may be eligible for.  Case to be closed to social work at this time.  Patient's wife encouraged to call [facility] back if future social work needs arise.    [name] [facility] Care Management [phone]

## 2015-11-04 DIAGNOSIS — E785 Hyperlipidemia, unspecified: Secondary | ICD-10-CM | POA: Diagnosis not present

## 2015-11-04 DIAGNOSIS — M109 Gout, unspecified: Secondary | ICD-10-CM | POA: Diagnosis not present

## 2015-11-04 DIAGNOSIS — F315 Bipolar disorder, current episode depressed, severe, with psychotic features: Secondary | ICD-10-CM | POA: Diagnosis not present

## 2015-11-04 DIAGNOSIS — I10 Essential (primary) hypertension: Secondary | ICD-10-CM | POA: Diagnosis not present

## 2015-11-04 DIAGNOSIS — Z794 Long term (current) use of insulin: Secondary | ICD-10-CM | POA: Diagnosis not present

## 2015-11-04 DIAGNOSIS — I482 Chronic atrial fibrillation: Secondary | ICD-10-CM | POA: Diagnosis not present

## 2015-11-04 DIAGNOSIS — E119 Type 2 diabetes mellitus without complications: Secondary | ICD-10-CM | POA: Diagnosis not present

## 2015-11-04 DIAGNOSIS — F419 Anxiety disorder, unspecified: Secondary | ICD-10-CM | POA: Diagnosis not present

## 2015-11-04 DIAGNOSIS — G4733 Obstructive sleep apnea (adult) (pediatric): Secondary | ICD-10-CM | POA: Diagnosis not present

## 2015-11-05 DIAGNOSIS — Z794 Long term (current) use of insulin: Secondary | ICD-10-CM | POA: Diagnosis not present

## 2015-11-05 DIAGNOSIS — F419 Anxiety disorder, unspecified: Secondary | ICD-10-CM | POA: Diagnosis not present

## 2015-11-05 DIAGNOSIS — I2699 Other pulmonary embolism without acute cor pulmonale: Secondary | ICD-10-CM | POA: Diagnosis not present

## 2015-11-05 DIAGNOSIS — N182 Chronic kidney disease, stage 2 (mild): Secondary | ICD-10-CM | POA: Diagnosis not present

## 2015-11-05 DIAGNOSIS — I10 Essential (primary) hypertension: Secondary | ICD-10-CM | POA: Diagnosis not present

## 2015-11-05 DIAGNOSIS — N400 Enlarged prostate without lower urinary tract symptoms: Secondary | ICD-10-CM | POA: Diagnosis not present

## 2015-11-05 DIAGNOSIS — E785 Hyperlipidemia, unspecified: Secondary | ICD-10-CM | POA: Diagnosis not present

## 2015-11-05 DIAGNOSIS — F315 Bipolar disorder, current episode depressed, severe, with psychotic features: Secondary | ICD-10-CM | POA: Diagnosis not present

## 2015-11-05 DIAGNOSIS — E1122 Type 2 diabetes mellitus with diabetic chronic kidney disease: Secondary | ICD-10-CM | POA: Diagnosis not present

## 2015-11-05 DIAGNOSIS — G4733 Obstructive sleep apnea (adult) (pediatric): Secondary | ICD-10-CM | POA: Diagnosis not present

## 2015-11-05 DIAGNOSIS — E1165 Type 2 diabetes mellitus with hyperglycemia: Secondary | ICD-10-CM | POA: Diagnosis not present

## 2015-11-05 DIAGNOSIS — I482 Chronic atrial fibrillation: Secondary | ICD-10-CM | POA: Diagnosis not present

## 2015-11-05 DIAGNOSIS — E6609 Other obesity due to excess calories: Secondary | ICD-10-CM | POA: Diagnosis not present

## 2015-11-05 DIAGNOSIS — M109 Gout, unspecified: Secondary | ICD-10-CM | POA: Diagnosis not present

## 2015-11-05 DIAGNOSIS — E119 Type 2 diabetes mellitus without complications: Secondary | ICD-10-CM | POA: Diagnosis not present

## 2015-11-06 ENCOUNTER — Other Ambulatory Visit: Payer: Self-pay | Admitting: *Deleted

## 2015-11-06 DIAGNOSIS — I10 Essential (primary) hypertension: Secondary | ICD-10-CM | POA: Diagnosis not present

## 2015-11-06 DIAGNOSIS — I482 Chronic atrial fibrillation: Secondary | ICD-10-CM | POA: Diagnosis not present

## 2015-11-06 DIAGNOSIS — Z794 Long term (current) use of insulin: Secondary | ICD-10-CM | POA: Diagnosis not present

## 2015-11-06 DIAGNOSIS — M109 Gout, unspecified: Secondary | ICD-10-CM | POA: Diagnosis not present

## 2015-11-06 DIAGNOSIS — E119 Type 2 diabetes mellitus without complications: Secondary | ICD-10-CM | POA: Diagnosis not present

## 2015-11-06 DIAGNOSIS — F315 Bipolar disorder, current episode depressed, severe, with psychotic features: Secondary | ICD-10-CM | POA: Diagnosis not present

## 2015-11-06 DIAGNOSIS — G4733 Obstructive sleep apnea (adult) (pediatric): Secondary | ICD-10-CM | POA: Diagnosis not present

## 2015-11-06 DIAGNOSIS — E785 Hyperlipidemia, unspecified: Secondary | ICD-10-CM | POA: Diagnosis not present

## 2015-11-06 DIAGNOSIS — F419 Anxiety disorder, unspecified: Secondary | ICD-10-CM | POA: Diagnosis not present

## 2015-11-06 NOTE — Patient Outreach (Signed)
[facility]) Care Management   11/06/2015  Christian [name] 20-Sep-1951 [id number]  Christian [name] is an 64 y.o. male  Subjective:  Pt reports on recent f/u with Dr. Nancy Fetter, MD increased his Lisinopril to 40 mg daily - BP  elevated.  Pt states today is his second day taking the 40 mg of Lisinopril, BP this am was 140/104 (before medicine).  Pt reports he informed MD  stopped taking Eliquis (one week), could not afford to have refilled so  MD gave him 4-5 weeks of samples.  Pt states the cost of Eliquis will go down in January 2017- $47 a month which he can afford.  Spouse states pt's A1C was elevated (8.7), pt to change his diet.  Pt states his sugar this am was 260.   Pt states he also has gained weight, was told needed to loose, to f/u again with MD in 3 months.  Spouse states HH PT and OT are working with pt.   Objective:   Filed Vitals:   11/06/15 1034 11/06/15 1101  BP: 140/94 138/98  Pulse: 98 95  Resp:  24    ROS  Physical Exam  Constitutional: He is oriented to person, place, and time. He appears well-developed and well-nourished.  Cardiovascular: Normal rate and regular rhythm.   Respiratory: Breath sounds normal.  GI: Soft. Bowel sounds are normal.  Musculoskeletal: Normal range of motion. He exhibits edema.  +1 edema in bilateral ankles, trace top of feet.   Neurological: He is alert and oriented to person, place, and time.  Skin: Skin is warm and dry.  Psychiatric: He has a normal mood and affect. His behavior is normal. Judgment and thought content normal.    Current Medications:  Reviewed with pt  Current Outpatient Prescriptions  Medication Sig Dispense Refill  . allopurinol (ZYLOPRIM) 300 MG tablet Take 1 tablet (300 mg total) by mouth daily. 30 tablet 0  . apixaban (ELIQUIS) 5 MG TABS tablet Take 1 tablet (5 mg total) by mouth 2 (two) times daily. 60 tablet 0  . aspirin EC 81 MG EC tablet Take 1 tablet (81 mg total) by mouth daily. 30 tablet 0  .  atorvastatin (LIPITOR) 10 MG tablet Take 1 tablet (10 mg total) by mouth daily at 6 PM. 30 tablet 0  . clonazePAM (KLONOPIN) 1 MG tablet Take 1 tablet (1 mg total) by mouth at bedtime. 30 tablet 0  . docusate sodium (COLACE) 100 MG capsule Take 2 capsules (200 mg total) by mouth 2 (two) times daily. 60 capsule 0  . furosemide (LASIX) 20 MG tablet Take 20 mg by mouth.    . insulin aspart (NOVOLOG) 100 UNIT/ML injection Inject 4 Units into the skin 3 (three) times daily with meals. 10 mL 0  . insulin aspart (NOVOLOG) 100 UNIT/ML injection Inject 0-15 Units into the skin 3 (three) times daily with meals. 10 mL 0  . insulin detemir (LEVEMIR) 100 UNIT/ML injection Inject 17 Units into the skin at bedtime.    Marland Kitchen lisinopril (PRINIVIL,ZESTRIL) 5 MG tablet Take 1 tablet (5 mg total) by mouth daily. (Patient taking differently: Take 10 mg by mouth daily. ) 30 tablet 0  . mirtazapine (REMERON) 45 MG tablet Take 1 tablet (45 mg total) by mouth at bedtime. 30 tablet 0  . OLANZapine (ZYPREXA) 15 MG tablet Take 2 tablets (30 mg total) by mouth at bedtime. 60 tablet 0  . polyethylene glycol (MIRALAX / GLYCOLAX) packet Take 17 g by mouth  daily. 30 each 0  . colchicine 0.6 MG tablet Take 1 tablet (0.6 mg total) by mouth 2 (two) times daily. (Patient not taking: Reported on 11/06/2015) 60 tablet 0  . finasteride (PROSCAR) 5 MG tablet Take 1 tablet (5 mg total) by mouth daily. (Patient not taking: Reported on 11/06/2015) 30 tablet 0  . insulin aspart (NOVOLOG) 100 UNIT/ML injection Inject 0-5 Units into the skin at bedtime. 10 mL 0  . insulin glargine (LANTUS) 100 UNIT/ML injection Inject 0.15 mLs (15 Units total) into the skin at bedtime. (Patient not taking: Reported on 10/07/2015) 10 mL 0  . methylphenidate (RITALIN) 5 MG tablet Take 1 tablet (5 mg total) by mouth every morning. (Patient not taking: Reported on 10/06/2015) 30 tablet 0  . midodrine (PROAMATINE) 5 MG tablet Take 1 tablet (5 mg total) by mouth 3 (three)  times daily with meals. (Patient not taking: Reported on 10/09/2015) 90 tablet 0  . tamsulosin (FLOMAX) 0.4 MG CAPS capsule Take 1 capsule (0.4 mg total) by mouth daily after supper. (Patient not taking: Reported on 11/06/2015) 30 capsule 0   No current facility-administered medications for this visit.    Functional Status:   In your present state of health, do you have any difficulty performing the following activities: 10/09/2015 07/11/2015  Hearing? N N  Vision? N N  Difficulty concentrating or making decisions? N Y  Walking or climbing stairs? Y Y  Dressing or bathing? Y Y  Doing errands, shopping? Tempie Donning  Preparing Food and eating ? Y -  Using the Toilet? N -  In the past six months, have you accidently leaked urine? N -  Do you have problems with loss of bowel control? N -  Managing your Medications? N -  Managing your Finances? Y -  Housekeeping or managing your Housekeeping? Y -  Some encounter information is confidential and restricted. Go to Review Flowsheets activity to see all data.    Fall/Depression Screening:    PHQ 2/9 Scores 10/09/2015  PHQ - 2 Score 2  PHQ- 9 Score 8    Assessment:   HTN-   BP results this am with pt's machine was 140/94 (BP medication taken 1-2 hours earlier), recheck with nurse's cuff- 138/98.                              DM-  Pt's sugar this am 260, view of pt's recorded sugars range 197-277 am, 150-339 pm.                                     Need better compliance with diabetic diet.                             Plan:  Pt to continue to monitor BP daily/record in [facility] calendar provided today/bring readings to next               MD office visit.   Low Na+ diet.              Pt  to take Lisinopril as directed, call MD if BP continues to be elevated.            Pt to continue to check sugars daily/record/bring to next MD office visit.  Pt to monitor carbohydrates- review Carbohydrate counting sheet provided today.            RN CM to  continue to provide community nurse case management services, next home visit 12/08/15.  THN CM Care Plan Problem One        Most Recent Value   Care Plan Problem One  Risk for readmission- recent discharge from SNF    Role Documenting the Problem One  Care Management [place] for Problem One  Active   [facility]N Long Term Goal (31-90 days)  Pt would not readmit to SNF in the next 31 days    THN Long Term Goal Start Date  10/07/15   [facility] Long Term Goal Met Date  11/06/15   Interventions for Problem One Long Term Goal  RN CM to provide weekly phone calls x4, home visit scheduled 11/10, reviewed discharge medications    [facility] CM Short Term Goal #1 (0-30 days)  Pt's sugars would be within norm in the next 30 days    [facility]N CM Short Term Goal #1 Start Date  10/07/15   [facility] CM Short Term Goal #1 Met Date  -- [not met.  Pt more motivated to change diet. ]   Interventions for Short Term Goal #1  Discussed with spouse use of insulin pen, pt to continue to take insulin as ordered.    THN CM Short Term Goal #2 (0-30 days)  pt's BP would be within norm within the next 30 days    [facility]N CM Short Term Goal #2 Start Date  10/07/15   [facility] CM Short Term Goal #2 Met Date  -- [not met-  BP continue to be elevated. ]   Interventions for Short Term Goal #2  Provided pt with BP machine, demonstrated to spouse how to use.   Discussed with spouse checking pt's  BP daily at different times, record, report high readings to MD.     [facility] CM Care Plan Problem Two        Most Recent Value   Care Plan Problem Two  DM- elevated blood sugars    Role Documenting the Problem Two  Care Management Coordinator   Care Plan for Problem Two  Active   Interventions for Problem Two Long Term Goal   Discussed with pt importance of adherence to diabetic diet, monitoring carbohydrate intake.    THN Long Term Goal (31-90) days  Pt's A1C would drop one point within the next 90 days    THN Long Term Goal Start Date  11/06/15   [facility] CM Short  Term Goal #1 (0-30 days)  Pt blood sugars would be under 250 within the next 30 days    THN CM Short Term Goal #1 Start Date  11/06/15   Interventions for Short Term Goal #2   Provided pt with carbohydrate counting- discussed having 3-4 carbohydrates per meal, portion sizes, protein at each meal/snacks.     THN CM Care Plan Problem Three        Most Recent Value   Care Plan Problem Three  HTN- uncontrolled    Role Documenting the Problem Three  Care Management Coordinator   Care Plan for Problem Three  Active   THN CM Short Term Goal #1 (0-30 days)  Pt would see an improvement in BP readings in the next 30 days    [facility]N CM Short Term Goal #1 Start Date  11/06/15   Interventions for Short Term Goal #1  Reviewed with pt to continue to monitor BP daily/record, ongoing compliance with BP medication, call MD if readings are still elevated.  [RN CM provided pt withTHN calendar- record/bring to MD visit]      Zara Chess.   [place] Care Management  [phone]

## 2015-11-10 DIAGNOSIS — F315 Bipolar disorder, current episode depressed, severe, with psychotic features: Secondary | ICD-10-CM | POA: Diagnosis not present

## 2015-11-10 DIAGNOSIS — G4733 Obstructive sleep apnea (adult) (pediatric): Secondary | ICD-10-CM | POA: Diagnosis not present

## 2015-11-10 DIAGNOSIS — E785 Hyperlipidemia, unspecified: Secondary | ICD-10-CM | POA: Diagnosis not present

## 2015-11-10 DIAGNOSIS — M109 Gout, unspecified: Secondary | ICD-10-CM | POA: Diagnosis not present

## 2015-11-10 DIAGNOSIS — Z794 Long term (current) use of insulin: Secondary | ICD-10-CM | POA: Diagnosis not present

## 2015-11-10 DIAGNOSIS — I10 Essential (primary) hypertension: Secondary | ICD-10-CM | POA: Diagnosis not present

## 2015-11-10 DIAGNOSIS — F419 Anxiety disorder, unspecified: Secondary | ICD-10-CM | POA: Diagnosis not present

## 2015-11-10 DIAGNOSIS — E119 Type 2 diabetes mellitus without complications: Secondary | ICD-10-CM | POA: Diagnosis not present

## 2015-11-10 DIAGNOSIS — I482 Chronic atrial fibrillation: Secondary | ICD-10-CM | POA: Diagnosis not present

## 2015-11-13 DIAGNOSIS — M109 Gout, unspecified: Secondary | ICD-10-CM | POA: Diagnosis not present

## 2015-11-13 DIAGNOSIS — E119 Type 2 diabetes mellitus without complications: Secondary | ICD-10-CM | POA: Diagnosis not present

## 2015-11-13 DIAGNOSIS — I482 Chronic atrial fibrillation: Secondary | ICD-10-CM | POA: Diagnosis not present

## 2015-11-13 DIAGNOSIS — I10 Essential (primary) hypertension: Secondary | ICD-10-CM | POA: Diagnosis not present

## 2015-11-13 DIAGNOSIS — F315 Bipolar disorder, current episode depressed, severe, with psychotic features: Secondary | ICD-10-CM | POA: Diagnosis not present

## 2015-11-13 DIAGNOSIS — G4733 Obstructive sleep apnea (adult) (pediatric): Secondary | ICD-10-CM | POA: Diagnosis not present

## 2015-11-13 DIAGNOSIS — Z794 Long term (current) use of insulin: Secondary | ICD-10-CM | POA: Diagnosis not present

## 2015-11-13 DIAGNOSIS — F419 Anxiety disorder, unspecified: Secondary | ICD-10-CM | POA: Diagnosis not present

## 2015-11-13 DIAGNOSIS — E785 Hyperlipidemia, unspecified: Secondary | ICD-10-CM | POA: Diagnosis not present

## 2015-11-17 ENCOUNTER — Encounter: Payer: Commercial Managed Care - HMO | Admitting: Certified Registered Nurse Anesthetist

## 2015-11-17 ENCOUNTER — Encounter
Admission: RE | Admit: 2015-11-17 | Discharge: 2015-11-17 | Disposition: A | Discharge status: Home / Self Care | Payer: Commercial Managed Care - HMO | Source: Ambulatory Visit | Attending: Psychiatry | Admitting: Psychiatry

## 2015-11-17 ENCOUNTER — Encounter: Payer: Self-pay | Admitting: Certified Registered Nurse Anesthetist

## 2015-11-17 ENCOUNTER — Other Ambulatory Visit: Payer: Self-pay | Admitting: Pharmacist

## 2015-11-17 DIAGNOSIS — F319 Bipolar disorder, unspecified: Secondary | ICD-10-CM | POA: Diagnosis not present

## 2015-11-17 DIAGNOSIS — E785 Hyperlipidemia, unspecified: Secondary | ICD-10-CM | POA: Insufficient documentation

## 2015-11-17 DIAGNOSIS — I10 Essential (primary) hypertension: Secondary | ICD-10-CM | POA: Diagnosis not present

## 2015-11-17 DIAGNOSIS — G4733 Obstructive sleep apnea (adult) (pediatric): Secondary | ICD-10-CM | POA: Insufficient documentation

## 2015-11-17 DIAGNOSIS — R600 Localized edema: Secondary | ICD-10-CM | POA: Diagnosis not present

## 2015-11-17 DIAGNOSIS — M109 Gout, unspecified: Secondary | ICD-10-CM | POA: Insufficient documentation

## 2015-11-17 DIAGNOSIS — Z6836 Body mass index (BMI) 36.0-36.9, adult: Secondary | ICD-10-CM | POA: Diagnosis not present

## 2015-11-17 DIAGNOSIS — F3130 Bipolar disorder, current episode depressed, mild or moderate severity, unspecified: Secondary | ICD-10-CM | POA: Insufficient documentation

## 2015-11-17 DIAGNOSIS — E669 Obesity, unspecified: Secondary | ICD-10-CM | POA: Diagnosis not present

## 2015-11-17 DIAGNOSIS — E119 Type 2 diabetes mellitus without complications: Secondary | ICD-10-CM | POA: Insufficient documentation

## 2015-11-17 DIAGNOSIS — I739 Peripheral vascular disease, unspecified: Secondary | ICD-10-CM | POA: Diagnosis not present

## 2015-11-17 LAB — GLUCOSE, CAPILLARY: Glucose-Capillary: 212 mg/dL — ABNORMAL HIGH (ref 65–99)

## 2015-11-17 MED ORDER — KETAMINE HCL 10 MG/ML IJ SOLN
120.0000 mg | Freq: Once | INTRAMUSCULAR | Status: AC
  Administered 2015-11-17: 120 mg via INTRAVENOUS

## 2015-11-17 MED ORDER — DEXTROSE-NACL 5-0.9 % IV SOLN
INTRAVENOUS | Status: DC | PRN
  Administered 2015-11-17: 12:00:00 via INTRAVENOUS

## 2015-11-17 MED ORDER — LABETALOL HCL 5 MG/ML IV SOLN
20.0000 mg | Freq: Once | INTRAVENOUS | Status: AC
  Administered 2015-11-17: 20 mg via INTRAVENOUS

## 2015-11-17 MED ORDER — LABETALOL HCL 5 MG/ML IV SOLN
5.0000 mg | INTRAVENOUS | Status: DC | PRN
  Administered 2015-11-17 (×2): 5 mg via INTRAVENOUS

## 2015-11-17 MED ORDER — SODIUM CHLORIDE 0.9 % IV SOLN
250.0000 mL | Freq: Once | INTRAVENOUS | Status: AC
  Administered 2015-11-17: 500 mL via INTRAVENOUS

## 2015-11-17 MED ORDER — FENTANYL CITRATE (PF) 100 MCG/2ML IJ SOLN: 25.0000 ug | INTRAMUSCULAR | Status: DC | PRN

## 2015-11-17 MED ORDER — LIDOCAINE HCL (CARDIAC) 20 MG/ML IV SOLN
4.0000 mg | Freq: Once | INTRAVENOUS | Status: AC
  Administered 2015-11-17: 4 mg via INTRAVENOUS

## 2015-11-17 MED ORDER — ONDANSETRON HCL 4 MG/2ML IJ SOLN: 4.0000 mg | Freq: Once | INTRAMUSCULAR | Status: DC | PRN

## 2015-11-17 MED ORDER — SUCCINYLCHOLINE CHLORIDE 20 MG/ML IJ SOLN
150.0000 mg | Freq: Once | INTRAMUSCULAR | Status: AC
  Administered 2015-11-17: 150 mg via INTRAVENOUS

## 2015-11-17 NOTE — Anesthesia Procedure Notes (Signed)
Date/Time: 11/17/2015 11:37 AM Performed by: Ginger Carne Pre-anesthesia Checklist: Patient identified, Timeout performed, Emergency Drugs available, Suction available and Patient being monitored Patient Re-evaluated:Patient Re-evaluated prior to inductionOxygen Delivery Method: Circle system utilized Preoxygenation: Pre-oxygenation with 100% oxygen

## 2015-11-17 NOTE — H&P (Signed)
Christian [name] is an 64 y.o. male.   Chief Complaint: Patient feels that his mood has been stable. Continues to have some unsteadiness in his gait and shoulder pain but is working on physical therapy. HPI: Patient receiving maintenance ECT for continued stabilization of depression as part of bipolar disorder  Past Medical History  Diagnosis Date  . Mood swings (HCC)   . Type II or unspecified type diabetes mellitus with unspecified complication, uncontrolled 06/02/2014  . Other and unspecified hyperlipidemia 06/02/2014  . OSA on CPAP 06/02/2014  . 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 Bipolar disorder (HCC)   . Hypertension   . Gout   . Bipolar 1 disorder (HCC)   . Hx of blood clots     History reviewed. No pertinent past surgical history.  Family History  Problem Relation Age of Onset  . Dementia Mother   . Arthritis Father   . Heart failure Neg Hx   . Kidney failure Neg Hx   . Cancer Neg Hx    Social History:  reports that he has never smoked. He does not have any smokeless tobacco history on file. He reports that he does not drink alcohol or use illicit drugs.  Allergies: No Known Allergies   (Not in a hospital admission)  Results for orders placed or performed during the hospital encounter of 11/17/15 (from the past 48 hour(s))  Glucose, capillary     Status: Abnormal   Collection Time: 11/17/15  9:21 AM  Result Value Ref Range   Glucose-Capillary 212 (H) 65 - 99 mg/dL   No results found.  Review of Systems  HENT: Negative.   Eyes: Negative.   Respiratory: Negative.   Cardiovascular: Negative.   Gastrointestinal: Negative.   Musculoskeletal: Positive for joint pain.  Skin: Negative.   Neurological: Positive for weakness.  Psychiatric/Behavioral: Negative for depression, suicidal ideas, hallucinations, memory loss and substance abuse. The patient is not nervous/anxious and does not have insomnia.     Blood pressure  143/85, pulse 99, temperature 98.2 F (36.8 C), temperature source Oral, height 5\' 10"  (1.778 m), weight 118.616 kg (261 lb 8 oz), SpO2 100 %. Physical Exam  Nursing note and vitals reviewed. Constitutional: He appears well-developed and well-nourished.  HENT:  Head: Normocephalic and atraumatic.  Eyes: Conjunctivae are normal. Pupils are equal, round, and reactive to light.  Neck: Normal range of motion.  Cardiovascular: Normal rate, regular rhythm and normal heart sounds.   Respiratory: Effort normal and breath sounds normal. No respiratory distress.  GI: Soft.  Musculoskeletal: Normal range of motion.  Neurological: He is alert.  Skin: Skin is warm and dry.  Psychiatric: He has a normal mood and affect. Judgment and thought content normal. His speech is delayed. He is slowed. Cognition and memory are normal.     Assessment/Plan Doing well. Planning to go see an endocrinologist about his diabetes. No change to medicine. ECT to be performed today and will follow-up in another 3 weeks.  Christian [name] 11/17/2015, 11:34 AM

## 2015-11-17 NOTE — Procedures (Addendum)
ECT SERVICES Physician's Interval Evaluation & Treatment Note  Patient Identification: LIZANDRO HINTON MRN:  [id number] Date of Evaluation:  11/17/2015 TX #: [id number]  MADRS:   MMSE:   P.E. Findings:  Blood pressure under better control no other change to physical exam no other new findings. Continues to have soreness in the shoulders but is working on physical therapy at home  Psychiatric Interval Note:  Mood is feeling good. No return of severe depression or psychosis  Subjective:  Patient is a 64 y.o. male seen for evaluation for Electroconvulsive Therapy. No specific new complaint  Treatment Summary:   []   Right Unilateral             [x]  Bilateral   % Energy : 1.0 ms 100%   Impedance: 1850 ohms  Seizure Energy Index: 486 V squared  Postictal Suppression Index: 24%  Seizure Concordance Index: 96%  Medications  Pre Shock: Xylocaine 4 mg, labetalol 20 mg, ketamine 120 mg, succinylcholine 150 mg  Post Shock: Labetalol 20 mg  Seizure Duration: 26 seconds by EMG, 31 seconds by EEG   Comments: Follow-up will be in 3 weeks on January 11.   Lungs:  [x]   Clear to auscultation               []  Other:   Heart:    [x]   Regular rhythm             []  irregular rhythm    [x]   Previous H&P reviewed, patient examined and there are NO CHANGES                 []   Previous H&P reviewed, patient examined and there are changes noted.   [name], MD 12/19/201611:37 AM

## 2015-11-17 NOTE — Anesthesia Postprocedure Evaluation (Signed)
Anesthesia Post Note  Patient: Christian [name]  Procedure(s) Performed: * No procedures listed *  Patient location during evaluation: PACU Anesthesia Type: General Level of consciousness: awake Pain management: pain level controlled Vital Signs Assessment: post-procedure vital signs reviewed and stable Respiratory status: spontaneous breathing Cardiovascular status: blood pressure returned to baseline Anesthetic complications: no    Last Vitals:  Filed Vitals:   11/17/15 1244 11/17/15 1248  BP:  156/100  Pulse: 98   Temp:    Resp: 16     Last Pain:  Filed Vitals:   11/17/15 1248  PainSc: 0-No pain                 Addam Goeller S

## 2015-11-17 NOTE — Patient Outreach (Signed)
Christian [name] is a 64 y.o. male referred to pharmacy for medication assistance. Per the referral, patient is having difficulty with affording his Eliquis, Novolog and Levemir. Left a HIPAA compliant message on the patient's voicemail. If have not heard from patient by 11/19/15, will give her another call at that time.  Duanne Moron, PharmD Clinical Pharmacist [facility] Care Management [phone]

## 2015-11-17 NOTE — Anesthesia Preprocedure Evaluation (Addendum)
Anesthesia Evaluation    Airway Mallampati: III  TM Distance: >3 FB     Dental  (+) Chipped   Pulmonary           Cardiovascular hypertension,      Neuro/Psych    GI/Hepatic   Endo/Other  diabetes  Renal/GU      Musculoskeletal   Abdominal   Peds  Hematology   Anesthesia Other Findings BP much better controlled today.  Reproductive/Obstetrics                           Anesthesia Physical Anesthesia Plan  ASA: III  Anesthesia Plan: General   Post-op Pain Management:    Induction: Intravenous  Airway Management Planned: Mask  Additional Equipment:   Intra-op Plan:   Post-operative Plan:   Informed Consent: I have reviewed the patients History and Physical, chart, labs and discussed the procedure including the risks, benefits and alternatives for the proposed anesthesia with the patient or authorized representative who has indicated his/her understanding and acceptance.     Plan Discussed with: CRNA  Anesthesia Plan Comments:         Anesthesia Quick Evaluation                                  Anesthesia Evaluation  Patient identified by MRN, date of birth, ID band Patient awake and Patient confused    Reviewed: Allergy & Precautions, NPO status , Patient's Chart, lab work & pertinent test results, reviewed documented beta blocker date and time   Airway Mallampati: III       Dental  (+) Edentulous Upper, Edentulous Lower   Pulmonary shortness of breath, sleep apnea ,     + decreased breath sounds      Cardiovascular hypertension, Pt. on medications + Peripheral Vascular Disease and + DVT  Normal cardiovascular exam     Neuro/Psych Depression Bipolar Disorder    GI/Hepatic negative GI ROS, Neg liver ROS,   Endo/Other  diabetes, Type 1, Insulin Dependent  Renal/GU      Musculoskeletal   Abdominal (+) + obese,   Peds  Hematology    Anesthesia Other Findings   Reproductive/Obstetrics                             Anesthesia Physical Anesthesia Plan  ASA: III  Anesthesia Plan: General   Post-op Pain Management:    Induction: Intravenous  Airway Management Planned: Mask  Additional Equipment:   Intra-op Plan:   Post-operative Plan:   Informed Consent: I have reviewed the patients History and Physical, chart, labs and discussed the procedure including the risks, benefits and alternatives for the proposed anesthesia with the patient or authorized representative who has indicated his/her understanding and acceptance.     Plan Discussed with: CRNA  Anesthesia Plan Comments:         Anesthesia Quick Evaluation

## 2015-11-17 NOTE — Transfer of Care (Signed)
Immediate Anesthesia Transfer of Care Note  Patient: Christian [name]  Procedure(s) Performed: * No procedures listed *  Patient Location: PACU  Anesthesia Type:General  Level of Consciousness: sedated  Airway & Oxygen Therapy: Patient Spontanous Breathing and Patient connected to face mask oxygen  Post-op Assessment: Report given to RN and Post -op Vital signs reviewed and stable  Post vital signs: Reviewed and stable  Last Vitals:  Filed Vitals:   11/17/15 0947  BP: 143/85  Pulse: 99  Temp: 36.8 C    Complications: No apparent anesthesia complications

## 2015-11-18 DIAGNOSIS — Z794 Long term (current) use of insulin: Secondary | ICD-10-CM | POA: Diagnosis not present

## 2015-11-18 DIAGNOSIS — I10 Essential (primary) hypertension: Secondary | ICD-10-CM | POA: Diagnosis not present

## 2015-11-18 DIAGNOSIS — M109 Gout, unspecified: Secondary | ICD-10-CM | POA: Diagnosis not present

## 2015-11-18 DIAGNOSIS — G4733 Obstructive sleep apnea (adult) (pediatric): Secondary | ICD-10-CM | POA: Diagnosis not present

## 2015-11-18 DIAGNOSIS — E119 Type 2 diabetes mellitus without complications: Secondary | ICD-10-CM | POA: Diagnosis not present

## 2015-11-18 DIAGNOSIS — I482 Chronic atrial fibrillation: Secondary | ICD-10-CM | POA: Diagnosis not present

## 2015-11-18 DIAGNOSIS — F315 Bipolar disorder, current episode depressed, severe, with psychotic features: Secondary | ICD-10-CM | POA: Diagnosis not present

## 2015-11-18 DIAGNOSIS — E785 Hyperlipidemia, unspecified: Secondary | ICD-10-CM | POA: Diagnosis not present

## 2015-11-18 DIAGNOSIS — F419 Anxiety disorder, unspecified: Secondary | ICD-10-CM | POA: Diagnosis not present

## 2015-11-19 ENCOUNTER — Other Ambulatory Visit: Payer: Self-pay | Admitting: Pharmacist

## 2015-11-19 DIAGNOSIS — G4733 Obstructive sleep apnea (adult) (pediatric): Secondary | ICD-10-CM | POA: Diagnosis not present

## 2015-11-19 DIAGNOSIS — F315 Bipolar disorder, current episode depressed, severe, with psychotic features: Secondary | ICD-10-CM | POA: Diagnosis not present

## 2015-11-19 DIAGNOSIS — E119 Type 2 diabetes mellitus without complications: Secondary | ICD-10-CM | POA: Diagnosis not present

## 2015-11-19 DIAGNOSIS — I10 Essential (primary) hypertension: Secondary | ICD-10-CM | POA: Diagnosis not present

## 2015-11-19 DIAGNOSIS — F419 Anxiety disorder, unspecified: Secondary | ICD-10-CM | POA: Diagnosis not present

## 2015-11-19 DIAGNOSIS — Z794 Long term (current) use of insulin: Secondary | ICD-10-CM | POA: Diagnosis not present

## 2015-11-19 DIAGNOSIS — E785 Hyperlipidemia, unspecified: Secondary | ICD-10-CM | POA: Diagnosis not present

## 2015-11-19 DIAGNOSIS — M109 Gout, unspecified: Secondary | ICD-10-CM | POA: Diagnosis not present

## 2015-11-19 DIAGNOSIS — I482 Chronic atrial fibrillation: Secondary | ICD-10-CM | POA: Diagnosis not present

## 2015-11-19 NOTE — Patient Outreach (Signed)
Christian [name] is a 64 y.o. male referred to pharmacy for medication assistance. Per the referral, patient is having difficulty with affording his Eliquis, Novolog and Levemir. Call attempt #2. Left a HIPAA compliant message on the patient's voicemail. If have not heard from patient by next week, will give him another call at that time.  Duanne Moron, PharmD Clinical Pharmacist [facility] Care Management [phone]

## 2015-11-22 DIAGNOSIS — I482 Chronic atrial fibrillation: Secondary | ICD-10-CM | POA: Diagnosis not present

## 2015-11-22 DIAGNOSIS — M109 Gout, unspecified: Secondary | ICD-10-CM | POA: Diagnosis not present

## 2015-11-22 DIAGNOSIS — F419 Anxiety disorder, unspecified: Secondary | ICD-10-CM | POA: Diagnosis not present

## 2015-11-22 DIAGNOSIS — Z794 Long term (current) use of insulin: Secondary | ICD-10-CM | POA: Diagnosis not present

## 2015-11-22 DIAGNOSIS — G4733 Obstructive sleep apnea (adult) (pediatric): Secondary | ICD-10-CM | POA: Diagnosis not present

## 2015-11-22 DIAGNOSIS — I10 Essential (primary) hypertension: Secondary | ICD-10-CM | POA: Diagnosis not present

## 2015-11-22 DIAGNOSIS — E119 Type 2 diabetes mellitus without complications: Secondary | ICD-10-CM | POA: Diagnosis not present

## 2015-11-22 DIAGNOSIS — F315 Bipolar disorder, current episode depressed, severe, with psychotic features: Secondary | ICD-10-CM | POA: Diagnosis not present

## 2015-11-22 DIAGNOSIS — E785 Hyperlipidemia, unspecified: Secondary | ICD-10-CM | POA: Diagnosis not present

## 2015-11-26 ENCOUNTER — Other Ambulatory Visit: Payer: Self-pay | Admitting: Pharmacist

## 2015-11-26 NOTE — Patient Outreach (Signed)
Christian [name] is a 64 y.o. male referred to pharmacy for medication assistance. Per the referral, patient is having difficulty with affording his Eliquis, Novolog and Levemir. Called and spoke with Christian [name] who reports that Christian [name] had been having trouble with affording these three medications. Reports that he is currently in the "donut hole" with his medications.  Reports that, as a result, the patient's PCP has stopped his insulin for now and has started the patient on metformin. Reports that so far Christian [name] has titrated him up to metformin 500 mg twice daily and they have a follow up appointment on 12/10/15. Christian [name] reports that at the time of that appointment she is aware that her husband will be back out of the "donut hole" with his insurance and they are planning to discuss whether the patient can stay on metformin or needs to go back on insulin.   Christian [name] further reports they are having difficulty with affording the patient's Eliquis copay. Discuss with Christian [name] the extra help application for assistance with medication cost. Christian [name] reports that they have not applied for this in the past and they would like help with completing this. Christian [name] reports that they would also like help with completing the patient assistance application for Eliquis.  Also discussed with Christian [name] the cost savings opportunity of using [employer] order and provided her with this phone number.  Provided Christian [name] with my phone number in case they have any medication questions in the future.  PLAN:  1) Will send an InBasket message to Care Management Assistant Christian [name] asking her to reach out to Mr and Mrs. [name] to assist them with the extra help application and patient assistance application for Eliquis.  2) Mr and Mrs. [name] to contact [employer] order about using this service for cost savings.  3) Mr and Mrs. [name] to follow up with Christian [name] at Christian [name]'s upcoming PCP  appointment about his medication management for his blood sugar.  4) Will follow with Care Management Assistant Christian [name] as she helps the patient with these medication assistance applications.   Duanne Moron, PharmD Clinical Pharmacist [facility] Care Management [phone]

## 2015-12-08 ENCOUNTER — Other Ambulatory Visit: Payer: Self-pay | Admitting: *Deleted

## 2015-12-08 ENCOUNTER — Ambulatory Visit: Payer: Self-pay | Admitting: *Deleted

## 2015-12-08 NOTE — Patient Outreach (Signed)
[facility]) Care Management   12/08/2015  Christian [name] 07-Feb-1951 [id number]  Franklin Baumbach Massman is an 65 y.o. male  Subjective:   Pt reports fell last night, bent over to check heater, lost balance- result bump on forehead.  Spouse reports pt ran out of Levemir and Novolog last month,was not monitoring- called MD to see if pt could be put on Metformin, could not afford the cost of insulins.    Pt reports he started on 500 mg of Metformin once a day, increased to 1000 mg a day, sugars are good, to f/u with Dr. Nancy Fetter tomorrow.   Spouse reports pt has  a new psychiatrist, needed to see someone more often, found one in network.   Spouse states this is in addition to his other psychiatrist who manages his medications.  Spouse reports when pt is depressed, it effects his health, next ECT 1/12.   Spouse reports pt is to see Dr. Gaynelle Arabian 1/25, needs refills on Flomax and Proscar.   Spouse reports pt's BP is coming down, today was 137/99.     Objective:   Filed Vitals:   12/08/15 1405 12/08/15 1439  BP: 150/100 157/111  Pulse: 98 102  Resp: 20     ROS  Physical Exam  Constitutional: He is oriented to person, place, and time. He appears well-developed and well-nourished.  Cardiovascular: Normal heart sounds.   HR 98-102  Respiratory: Effort normal and breath sounds normal.  GI: Soft.  Musculoskeletal: Normal range of motion.  Neurological: He is alert and oriented to person, place, and time.  Skin: Skin is warm and dry.  Bump to forehead- left side, reports result of fall last night   Psychiatric: He has a normal mood and affect. His behavior is normal. Judgment and thought content normal.    Current Medications:  Reviewed with pt and spouse  Current Outpatient Prescriptions  Medication Sig Dispense Refill  . allopurinol (ZYLOPRIM) 300 MG tablet Take 1 tablet (300 mg total) by mouth daily. 30 tablet 0  . apixaban (ELIQUIS) 5 MG TABS tablet Take 1 tablet (5 mg total) by  mouth 2 (two) times daily. 60 tablet 0  . aspirin EC 81 MG EC tablet Take 1 tablet (81 mg total) by mouth daily. 30 tablet 0  . atorvastatin (LIPITOR) 10 MG tablet Take 1 tablet (10 mg total) by mouth daily at 6 PM. 30 tablet 0  . clonazePAM (KLONOPIN) 1 MG tablet Take 1 tablet (1 mg total) by mouth at bedtime. 30 tablet 0  . docusate sodium (COLACE) 100 MG capsule Take 2 capsules (200 mg total) by mouth 2 (two) times daily. 60 capsule 0  . finasteride (PROSCAR) 5 MG tablet Take 1 tablet (5 mg total) by mouth daily. 30 tablet 0  . furosemide (LASIX) 20 MG tablet Take 20 mg by mouth.    Marland Kitchen lisinopril (PRINIVIL,ZESTRIL) 5 MG tablet Take 1 tablet (5 mg total) by mouth daily. (Patient taking differently: Take 40 mg by mouth daily. ) 30 tablet 0  . metFORMIN (GLUCOPHAGE) 500 MG tablet Take 500 mg by mouth daily. 2 tablets    . mirtazapine (REMERON) 45 MG tablet Take 1 tablet (45 mg total) by mouth at bedtime. 30 tablet 0  . OLANZapine (ZYPREXA) 15 MG tablet Take 2 tablets (30 mg total) by mouth at bedtime. 60 tablet 0  . tamsulosin (FLOMAX) 0.4 MG CAPS capsule Take 1 capsule (0.4 mg total) by mouth daily after supper. 30 capsule 0  .  colchicine 0.6 MG tablet Take 1 tablet (0.6 mg total) by mouth 2 (two) times daily. (Patient not taking: Reported on 11/06/2015) 60 tablet 0  . insulin aspart (NOVOLOG) 100 UNIT/ML injection Inject 4 Units into the skin 3 (three) times daily with meals. (Patient not taking: Reported on 11/17/2015) 10 mL 0  . insulin aspart (NOVOLOG) 100 UNIT/ML injection Inject 0-15 Units into the skin 3 (three) times daily with meals. (Patient not taking: Reported on 12/08/2015) 10 mL 0  . insulin aspart (NOVOLOG) 100 UNIT/ML injection Inject 0-5 Units into the skin at bedtime. (Patient not taking: Reported on 11/17/2015) 10 mL 0  . insulin detemir (LEVEMIR) 100 UNIT/ML injection Inject 17 Units into the skin at bedtime. Reported on 12/08/2015    . insulin glargine (LANTUS) 100 UNIT/ML injection  Inject 0.15 mLs (15 Units total) into the skin at bedtime. (Patient not taking: Reported on 10/07/2015) 10 mL 0  . methylphenidate (RITALIN) 5 MG tablet Take 1 tablet (5 mg total) by mouth every morning. (Patient not taking: Reported on 10/06/2015) 30 tablet 0  . midodrine (PROAMATINE) 5 MG tablet Take 1 tablet (5 mg total) by mouth 3 (three) times daily with meals. (Patient not taking: Reported on 10/09/2015) 90 tablet 0  . polyethylene glycol (MIRALAX / GLYCOLAX) packet Take 17 g by mouth daily. (Patient not taking: Reported on 12/08/2015) 30 each 0   No current facility-administered medications for this visit.    Functional Status:   In your present state of health, do you have any difficulty performing the following activities: 10/09/2015 07/11/2015  Hearing? N N  Vision? N N  Difficulty concentrating or making decisions? N Y  Walking or climbing stairs? Y Y  Dressing or bathing? Y Y  Doing errands, shopping? Tempie Donning  Preparing Food and eating ? Y -  Using the Toilet? N -  In the past six months, have you accidently leaked urine? N -  Do you have problems with loss of bowel control? N -  Managing your Medications? N -  Managing your Finances? Y -  Housekeeping or managing your Housekeeping? Y -  Some encounter information is confidential and restricted. Go to Review Flowsheets activity to see all data.    Fall/Depression Screening:    PHQ 2/9 Scores 10/09/2015  PHQ - 2 Score 2  PHQ- 9 Score 8    Assessment:   DM-  Sugars range per spouse in am 131-221, pm 147-221 (one time), today 136.                             HTN-   Spouse reports pt checking BP three times a day, ranges 120/85- 155/94.  Today- with nurse's                                       Cuff 150/100, with pt's BP machine 157/111.                           Skin integrity- bump on left side of forehead noted, no c/o pain.      Plan:   Pt to f/u with Dr. Nancy Fetter tomorrow.              Pt to continue to check sugars, record,  monitor carbohydrates.  Pt to continue to check BP, monitor sodium intact.               RN CM to continue to provide community nurse case management services, next home visit 2/8.     THN CM Care Plan Problem Two        Most Recent Value   Care Plan Problem Two  DM- elevated blood sugars    Role Documenting the Problem Two  Care Management Coordinator   Care Plan for Problem Two  Active   Interventions for Problem Two Long Term Goal   Discussed with pt importance of adherence to diabetic diet, monitoring carbohydrate intake.    THN Long Term Goal (31-90) days  Pt's A1C would drop one point within the next 90 days    THN Long Term Goal Start Date  11/06/15   [facility] CM Short Term Goal #1 (0-30 days)  Pt blood sugars would be under 250 within the next 30 days    THN CM Short Term Goal #1 Start Date  11/06/15   [facility] CM Short Term Goal #1 Met Date   12/08/15   Interventions for Short Term Goal #2   Provided pt with carbohydrate counting- discussed having 3-4 carbohydrates per meal, portion sizes, protein at each meal/snacks.     THN CM Care Plan Problem Three        Most Recent Value   Care Plan Problem Three  HTN- uncontrolled    Role Documenting the Problem Three  Care Management Coordinator   Care Plan for Problem Three  Active   [facility]N CM Short Term Goal #1 (0-30 days)  Pt would see an improvement in BP readings in the next 30 days    [facility]N CM Short Term Goal #1 Start Date  11/06/15   [facility] CM Short Term Goal #1 Met Date  -- [150/100 with nurse's cuff, 157/111- pt's machine ]   Interventions for Short Term Goal #1  Reviewed with pt to continue to monitor BP daily/record, ongoing compliance with BP medication, call MD if readings are still elevated.    [facility]N CM Short Term Goal #2 (0-30 days)  Pt's diastolic readings would be under 100 in the next 30 days    [facility]N CM Short Term Goal #2 Start Date  12/08/15   Interventions for Short Term Goal #2  Reviewed with pt ongoing compliance with Low Na+  diet, mental status can effect BP      Christian [name] M.   [facility]  [phone]

## 2015-12-08 NOTE — Patient Outreach (Addendum)
[facility]) Care Management  12/08/2015  Christian [name] 1951/07/21 [id number]   Telephone outreach to patient to assist in applying for extra help through social security website and completing patient assistance application for Eliquis. I had to leave a HIPPA compliant message for him to call me back.  Tom Macpherson L. Joliyah Lippens, AAS [facility] Care Management Assistant

## 2015-12-09 DIAGNOSIS — E1122 Type 2 diabetes mellitus with diabetic chronic kidney disease: Secondary | ICD-10-CM | POA: Diagnosis not present

## 2015-12-09 DIAGNOSIS — I2699 Other pulmonary embolism without acute cor pulmonale: Secondary | ICD-10-CM | POA: Diagnosis not present

## 2015-12-09 DIAGNOSIS — S80212A Abrasion, left knee, initial encounter: Secondary | ICD-10-CM | POA: Diagnosis not present

## 2015-12-09 DIAGNOSIS — N182 Chronic kidney disease, stage 2 (mild): Secondary | ICD-10-CM | POA: Diagnosis not present

## 2015-12-09 DIAGNOSIS — F3131 Bipolar disorder, current episode depressed, mild: Secondary | ICD-10-CM | POA: Diagnosis not present

## 2015-12-09 DIAGNOSIS — Z7984 Long term (current) use of oral hypoglycemic drugs: Secondary | ICD-10-CM | POA: Diagnosis not present

## 2015-12-09 NOTE — Patient Outreach (Signed)
[facility]) Care Management  12/09/2015  Christian [name] [birth year] [id number]   2nd telephone outreach to patient to assist with completing the extra help application through social security website. I had to leave a HIPPA compliant message for him to call me back.  Deyanna Mctier L. Valera Vallas, AAS [facility] Care Management Assistant

## 2015-12-10 ENCOUNTER — Encounter: Payer: Self-pay | Admitting: Anesthesiology

## 2015-12-10 ENCOUNTER — Encounter
Admission: RE | Admit: 2015-12-10 | Discharge: 2015-12-10 | Disposition: A | Discharge status: Home / Self Care | Payer: Commercial Managed Care - HMO | Source: Ambulatory Visit | Attending: Psychiatry | Admitting: Psychiatry

## 2015-12-10 DIAGNOSIS — R600 Localized edema: Secondary | ICD-10-CM | POA: Insufficient documentation

## 2015-12-10 DIAGNOSIS — E119 Type 2 diabetes mellitus without complications: Secondary | ICD-10-CM | POA: Insufficient documentation

## 2015-12-10 DIAGNOSIS — M109 Gout, unspecified: Secondary | ICD-10-CM | POA: Diagnosis not present

## 2015-12-10 DIAGNOSIS — E785 Hyperlipidemia, unspecified: Secondary | ICD-10-CM | POA: Insufficient documentation

## 2015-12-10 DIAGNOSIS — I739 Peripheral vascular disease, unspecified: Secondary | ICD-10-CM | POA: Diagnosis not present

## 2015-12-10 DIAGNOSIS — I10 Essential (primary) hypertension: Secondary | ICD-10-CM | POA: Diagnosis not present

## 2015-12-10 DIAGNOSIS — G4733 Obstructive sleep apnea (adult) (pediatric): Secondary | ICD-10-CM | POA: Insufficient documentation

## 2015-12-10 DIAGNOSIS — F315 Bipolar disorder, current episode depressed, severe, with psychotic features: Secondary | ICD-10-CM | POA: Insufficient documentation

## 2015-12-10 DIAGNOSIS — F319 Bipolar disorder, unspecified: Secondary | ICD-10-CM | POA: Diagnosis not present

## 2015-12-10 LAB — GLUCOSE, CAPILLARY: Glucose-Capillary: 144 mg/dL — ABNORMAL HIGH (ref 65–99)

## 2015-12-10 MED ORDER — KETAMINE HCL 10 MG/ML IJ SOLN
120.0000 mg | Freq: Once | INTRAMUSCULAR | Status: AC
  Administered 2015-12-10: 120 mg via INTRAVENOUS

## 2015-12-10 MED ORDER — LABETALOL HCL 5 MG/ML IV SOLN
20.0000 mg | Freq: Once | INTRAVENOUS | Status: AC
Start: 2015-12-10 — End: 2015-12-10
  Administered 2015-12-10: 20 mg via INTRAVENOUS

## 2015-12-10 MED ORDER — SODIUM CHLORIDE 0.9 % IV SOLN
250.0000 mL | Freq: Once | INTRAVENOUS | Status: AC
  Administered 2015-12-10: 250 mL via INTRAVENOUS

## 2015-12-10 MED ORDER — LIDOCAINE HCL (CARDIAC) 20 MG/ML IV SOLN
4.0000 mg | Freq: Once | INTRAVENOUS | Status: AC
  Administered 2015-12-10: 4 mg via INTRAVENOUS

## 2015-12-10 MED ORDER — SUCCINYLCHOLINE CHLORIDE 20 MG/ML IJ SOLN
150.0000 mg | Freq: Once | INTRAMUSCULAR | Status: AC
  Administered 2015-12-10: 150 mg via INTRAVENOUS

## 2015-12-10 NOTE — H&P (Signed)
Christian [name] is an 65 y.o. male.   Chief Complaint: Patient has been more depressed down and withdrawn and negative but not yet having psychotic symptoms. Eating has been down a little bit. More problems with sleep. HPI: Patient receiving maintenance ECT treatment seems to have had some worsening over the last couple weeks. He has a history of bipolar disorder  Past Medical History  Diagnosis Date  . Mood swings (HCC)   . Type II or unspecified type diabetes mellitus with unspecified complication, uncontrolled 06/02/2014  . Other and unspecified hyperlipidemia 06/02/2014  . OSA on CPAP 06/02/2014  . 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 Bipolar disorder (HCC)   . Hypertension   . Gout   . Bipolar 1 disorder (HCC)   . Hx of blood clots     History reviewed. No pertinent past surgical history.  Family History  Problem Relation Age of Onset  . Dementia Mother   . Arthritis Father   . Heart failure Neg Hx   . Kidney failure Neg Hx   . Cancer Neg Hx    Social History:  reports that he has never smoked. He does not have any smokeless tobacco history on file. He reports that he does not drink alcohol or use illicit drugs.  Allergies: No Known Allergies   (Not in a hospital admission)  Results for orders placed or performed during the hospital encounter of 12/10/15 (from the past 48 hour(s))  Glucose, capillary     Status: Abnormal   Collection Time: 12/10/15  9:30 AM  Result Value Ref Range   Glucose-Capillary 144 (H) 65 - 99 mg/dL   No results found.  Review of Systems  Constitutional: Negative.   HENT: Negative.  Negative for congestion, ear discharge, ear pain, hearing loss, nosebleeds, sore throat and tinnitus.        Patient has a small abrasion above his left eyebrow from a recent fall. Minor pain in the area.  Eyes: Negative.   Respiratory: Negative.  Negative for stridor.   Cardiovascular: Negative.   Gastrointestinal:  Negative.   Musculoskeletal: Negative.   Skin: Negative.   Neurological: Negative.   Psychiatric/Behavioral: Positive for depression. Negative for suicidal ideas, hallucinations, memory loss and substance abuse. The patient is nervous/anxious and has insomnia.     Blood pressure 107/71, pulse 111, temperature 98.6 F (37 C), temperature source Oral, resp. rate 18, height 6' (1.829 m), weight 109.317 kg (241 lb), SpO2 100 %. Physical Exam  Nursing note and vitals reviewed. Constitutional: He appears well-developed and well-nourished.  HENT:  Head: Normocephalic and atraumatic.  Eyes: Conjunctivae are normal. Pupils are equal, round, and reactive to light.  Neck: Normal range of motion.  Cardiovascular: Normal rate, regular rhythm and normal heart sounds.   Respiratory: Effort normal and breath sounds normal. No respiratory distress.  GI: Soft.  Musculoskeletal: Normal range of motion.  Neurological: He is alert.  Skin: Skin is warm and dry.  Psychiatric: Judgment and thought content normal. His affect is blunt. His speech is delayed. He is slowed. Cognition and memory are normal.     Assessment/Plan We would like to see him back in 1 week rather than 3 and I believe we have an agreement to do that. Patient will be starting seeing a new nurse practitioner for therapy starting tomorrow  [name] 12/10/2015, 11:23 AM

## 2015-12-10 NOTE — Anesthesia Postprocedure Evaluation (Signed)
Anesthesia Post Note  Patient: Christian [name]  Procedure(s) Performed: * No procedures listed *  Patient location during evaluation: PACU Anesthesia Type: General Level of consciousness: awake and alert and oriented Pain management: pain level controlled Vital Signs Assessment: post-procedure vital signs reviewed and stable Respiratory status: spontaneous breathing Cardiovascular status: blood pressure returned to baseline Anesthetic complications: no    Last Vitals:  Filed Vitals:   12/10/15 1222 12/10/15 1231  BP: 146/101 131/88  Pulse: 104 102  Temp:    Resp: 24 18    Last Pain:  Filed Vitals:   12/10/15 1233  PainSc: 0-No pain                 Eamon Tantillo

## 2015-12-10 NOTE — Transfer of Care (Signed)
Immediate Anesthesia Transfer of Care Note  Patient: Christian [name]  Procedure(s) Performed: * No procedures listed *  Patient Location: PACU  Anesthesia Type:General  Level of Consciousness: awake, alert , oriented and patient cooperative  Airway & Oxygen Therapy: Patient Spontanous Breathing and Patient connected to face mask oxygen  Post-op Assessment: Report given to RN, Post -op Vital signs reviewed and stable and Patient moving all extremities X 4  Post vital signs: Reviewed and stable  Last Vitals:  Filed Vitals:   12/10/15 0919  BP: 107/71  Pulse: 111  Temp: 37 C  Resp: 18    Complications: No apparent anesthesia complications

## 2015-12-10 NOTE — H&P (Deleted)
Christian [name] is an 65 y.o. male.   Chief Complaint: Patient with severe recurrent major depression receiving ECT. Minimal change from last time. Still has occasional suicidal thoughts without any plan or intent and with good insight. No psychosis. No new problems from ECT HPI: Continue with plan for full course of ECT. We have had some interruptions due to various factors and hoped that we can get more continuous treatment [facility]  Past Medical History  Diagnosis Date  . Mood swings (HCC)   . Type II or unspecified type diabetes mellitus with unspecified complication, uncontrolled 06/02/2014  . Other and unspecified hyperlipidemia 06/02/2014  . OSA on CPAP 06/02/2014  . 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 Bipolar disorder (HCC)   . Hypertension   . Gout   . Bipolar 1 disorder (HCC)   . Hx of blood clots     History reviewed. No pertinent past surgical history.  Family History  Problem Relation Age of Onset  . Dementia Mother   . Arthritis Father   . Heart failure Neg Hx   . Kidney failure Neg Hx   . Cancer Neg Hx    Social History:  reports that he has never smoked. He does not have any smokeless tobacco history on file. He reports that he does not drink alcohol or use illicit drugs.  Allergies: No Known Allergies   (Not in a hospital admission)  Results for orders placed or performed during the hospital encounter of 12/10/15 (from the past 48 hour(s))  Glucose, capillary     Status: Abnormal   Collection Time: 12/10/15  9:30 AM  Result Value Ref Range   Glucose-Capillary 144 (H) 65 - 99 mg/dL   No results found.  Review of Systems  Constitutional: Negative.   HENT: Negative.   Eyes: Negative.   Respiratory: Negative.   Cardiovascular: Negative.   Gastrointestinal: Negative.   Musculoskeletal: Negative.   Skin: Negative.   Neurological: Negative.   Psychiatric/Behavioral: Positive for depression and suicidal ideas.  Negative for hallucinations, memory loss and substance abuse. The patient is not nervous/anxious and does not have insomnia.     Blood pressure 149/101, pulse 100, temperature 99.5 F (37.5 C), temperature source Oral, resp. rate 17, height 6' (1.829 m), weight 109.317 kg (241 lb), SpO2 100 %. Physical Exam  Nursing note and vitals reviewed. Constitutional: He appears well-developed and well-nourished.  HENT:  Head: Normocephalic and atraumatic.  Eyes: Conjunctivae are normal. Pupils are equal, round, and reactive to light.  Neck: Normal range of motion.  Cardiovascular: Normal rate, regular rhythm and normal heart sounds.   Respiratory: Effort normal and breath sounds normal. No respiratory distress.  GI: Soft.  Musculoskeletal: Normal range of motion.  Neurological: He is alert.  Skin: Skin is warm and dry.  Psychiatric: Judgment normal. His speech is delayed. He is slowed. Cognition and memory are normal. He exhibits a depressed mood. He expresses no suicidal plans.     Assessment/Plan Next treatment scheduled for Friday after today.  Christian [name] 12/10/2015, 11:45 AM

## 2015-12-10 NOTE — Anesthesia Preprocedure Evaluation (Signed)
Anesthesia Evaluation  Patient identified by MRN, date of birth, ID band Patient awake    Reviewed: Allergy & Precautions, NPO status , Patient's Chart, lab work & pertinent test results  Airway Mallampati: III  TM Distance: <3 FB Neck ROM: Limited  Mouth opening: Limited Mouth Opening Comment: Looks difficult. Dental  (+) Teeth Intact   Pulmonary shortness of breath and with exertion, sleep apnea and Continuous Positive Airway Pressure Ventilation ,  Hx of PE          Cardiovascular Exercise Tolerance: Poor hypertension, Pt. on medications + Peripheral Vascular Disease  Normal cardiovascular exam     Neuro/Psych    GI/Hepatic negative GI ROS, Neg liver ROS,   Endo/Other  diabetes, Type 2BG 218.  Renal/GU negative Renal ROS     Musculoskeletal negative musculoskeletal ROS (+)   Abdominal (+) + obese,  Abdomen: soft.    Peds  Hematology negative hematology ROS (+)   Anesthesia Other Findings   Reproductive/Obstetrics                             Anesthesia Physical  Anesthesia Plan  ASA: III  Anesthesia Plan: General   Post-op Pain Management:    Induction: Intravenous  Airway Management Planned: Mask  Additional Equipment:   Intra-op Plan:   Post-operative Plan:   Informed Consent: I have reviewed the patients History and Physical, chart, labs and discussed the procedure including the risks, benefits and alternatives for the proposed anesthesia with the patient or authorized representative who has indicated his/her understanding and acceptance.     Plan Discussed with: CRNA and Surgeon  Anesthesia Plan Comments:         Anesthesia Quick Evaluation                                  Anesthesia Evaluation    Airway Mallampati: III  TM Distance: >3 FB     Dental  (+) Chipped   Pulmonary           Cardiovascular hypertension,       Neuro/Psych    GI/Hepatic   Endo/Other  diabetes  Renal/GU      Musculoskeletal   Abdominal   Peds  Hematology   Anesthesia Other Findings BP much better controlled today.  Reproductive/Obstetrics                           Anesthesia Physical Anesthesia Plan  ASA: III  Anesthesia Plan: General   Post-op Pain Management:    Induction: Intravenous  Airway Management Planned: Mask  Additional Equipment:   Intra-op Plan:   Post-operative Plan:   Informed Consent: I have reviewed the patients History and Physical, chart, labs and discussed the procedure including the risks, benefits and alternatives for the proposed anesthesia with the patient or authorized representative who has indicated his/her understanding and acceptance.     Plan Discussed with: CRNA  Anesthesia Plan Comments:         Anesthesia Quick Evaluation                                  Anesthesia Evaluation  Patient identified by MRN, date of birth, ID band Patient awake and Patient confused    Reviewed: Allergy & Precautions, NPO status ,  Patient's Chart, lab work & pertinent test results, reviewed documented beta blocker date and time   Airway Mallampati: III       Dental  (+) Edentulous Upper, Edentulous Lower   Pulmonary shortness of breath, sleep apnea ,     + decreased breath sounds      Cardiovascular hypertension, Pt. on medications + Peripheral Vascular Disease and + DVT  Normal cardiovascular exam     Neuro/Psych Depression Bipolar Disorder    GI/Hepatic negative GI ROS, Neg liver ROS,   Endo/Other  diabetes, Type 1, Insulin Dependent  Renal/GU      Musculoskeletal   Abdominal (+) + obese,   Peds  Hematology   Anesthesia Other Findings   Reproductive/Obstetrics                             Anesthesia Physical Anesthesia Plan  ASA: III  Anesthesia Plan: General   Post-op Pain  Management:    Induction: Intravenous  Airway Management Planned: Mask  Additional Equipment:   Intra-op Plan:   Post-operative Plan:   Informed Consent: I have reviewed the patients History and Physical, chart, labs and discussed the procedure including the risks, benefits and alternatives for the proposed anesthesia with the patient or authorized representative who has indicated his/her understanding and acceptance.     Plan Discussed with: CRNA  Anesthesia Plan Comments:         Anesthesia Quick Evaluation

## 2015-12-10 NOTE — Procedures (Signed)
ECT SERVICES Physician's Interval Evaluation & Treatment Note  Patient Identification: Christian [name] MRN:  [id number] Date of Evaluation:  12/10/2015 TX #: 22  MADRS:   MMSE:   P.E. Findings:  Physically his blood pressure is under good control no other new physical exam findings  Psychiatric Interval Note:  Mood has been more down and not yet having psychotic features somewhat more withdrawn energy level lower  Subjective:  Patient is a 65 y.o. male seen for evaluation for Electroconvulsive Therapy. Patient has an awareness that he's been feeling worse  Treatment Summary:   []   Right Unilateral             [x]  Bilateral   % Energy : 1.0 ms 100%   Impedance: 660 ohms  Seizure Energy Index: 2586 V squared  Postictal Suppression Index: 82%  Seizure Concordance Index: 98%  Medications  Pre Shock: Xylocaine 4 mg, labetalol 20 mg, ketamine 120 mg, succinylcholine 150 mg  Post Shock:    Seizure Duration: 36 seconds by EMG, 70 seconds by EEG   Comments: Patient will follow-up with possible in one week on January 18   Lungs:  [x]   Clear to auscultation               []  Other:   Heart:    [x]   Regular rhythm             []  irregular rhythm    [x]   Previous H&P reviewed, patient examined and there are NO CHANGES                 []   Previous H&P reviewed, patient examined and there are changes noted.   [name], MD 1/11/201711:26 AM

## 2015-12-11 ENCOUNTER — Encounter: Payer: Commercial Managed Care - HMO | Admitting: Primary Care

## 2015-12-11 ENCOUNTER — Encounter: Payer: Self-pay | Admitting: Primary Care

## 2015-12-11 NOTE — Progress Notes (Signed)
Pre visit review using our clinic review tool, if applicable. No additional management support is needed unless otherwise documented below in the visit note. 

## 2015-12-11 NOTE — Progress Notes (Deleted)
   Subjective:    Patient ID: Christian [name], male    DOB: [birth year], 65 y.o.   MRN: [id number]  HPI  Christian [name] is a 65 year old male who presents today to establish care and discuss the problems mentioned below. Will obtain old records. His last physical was Tuesday this week.   1) Essential Hypertension: Currently managed on lisinopril 40 mg.  2) Hyperlipidemia: Currently managed on atorvastatin 10 mg.  3) Diabetes Type 2: Currently managed on Metformin 1000 mg daily. A1C of 7.5 in October 2016  4) Bipolar Disorder with Depression: Currently managed on Zyprexa 15 mg.  5) Gout: Currently managed on allopurinol 300 mg   Review of Systems     Objective:   Physical Exam        Assessment & Plan:

## 2015-12-17 ENCOUNTER — Encounter
Admission: RE | Admit: 2015-12-17 | Discharge: 2015-12-17 | Disposition: A | Discharge status: Home / Self Care | Payer: Commercial Managed Care - HMO | Source: Ambulatory Visit | Attending: Psychiatry | Admitting: Psychiatry

## 2015-12-17 ENCOUNTER — Encounter: Payer: Self-pay | Admitting: *Deleted

## 2015-12-17 DIAGNOSIS — E785 Hyperlipidemia, unspecified: Secondary | ICD-10-CM | POA: Insufficient documentation

## 2015-12-17 DIAGNOSIS — M109 Gout, unspecified: Secondary | ICD-10-CM | POA: Diagnosis not present

## 2015-12-17 DIAGNOSIS — R600 Localized edema: Secondary | ICD-10-CM | POA: Diagnosis not present

## 2015-12-17 DIAGNOSIS — G4733 Obstructive sleep apnea (adult) (pediatric): Secondary | ICD-10-CM | POA: Insufficient documentation

## 2015-12-17 DIAGNOSIS — F3130 Bipolar disorder, current episode depressed, mild or moderate severity, unspecified: Secondary | ICD-10-CM | POA: Diagnosis not present

## 2015-12-17 DIAGNOSIS — F315 Bipolar disorder, current episode depressed, severe, with psychotic features: Secondary | ICD-10-CM | POA: Diagnosis not present

## 2015-12-17 DIAGNOSIS — I10 Essential (primary) hypertension: Secondary | ICD-10-CM | POA: Diagnosis not present

## 2015-12-17 DIAGNOSIS — E119 Type 2 diabetes mellitus without complications: Secondary | ICD-10-CM | POA: Diagnosis not present

## 2015-12-17 LAB — GLUCOSE, CAPILLARY: Glucose-Capillary: 167 mg/dL — ABNORMAL HIGH (ref 65–99)

## 2015-12-17 MED ORDER — LABETALOL HCL 5 MG/ML IV SOLN
20.0000 mg | Freq: Once | INTRAVENOUS | Status: AC
  Administered 2015-12-17: 20 mg via INTRAVENOUS

## 2015-12-17 MED ORDER — SUCCINYLCHOLINE CHLORIDE 20 MG/ML IJ SOLN
150.0000 mg | Freq: Once | INTRAMUSCULAR | Status: AC
  Administered 2015-12-17: 150 mg via INTRAVENOUS

## 2015-12-17 MED ORDER — KETAMINE HCL 10 MG/ML IJ SOLN
120.0000 mg | Freq: Once | INTRAMUSCULAR | Status: AC
  Administered 2015-12-17: 120 mg via INTRAVENOUS

## 2015-12-17 MED ORDER — LIDOCAINE HCL (CARDIAC) 20 MG/ML IV SOLN
4.0000 mg | Freq: Once | INTRAVENOUS | Status: AC
  Administered 2015-12-17: 4 mg via INTRAVENOUS

## 2015-12-17 MED ORDER — SODIUM CHLORIDE 0.9 % IV SOLN
250.0000 mL | Freq: Once | INTRAVENOUS | Status: AC
  Administered 2015-12-17: 500 mL via INTRAVENOUS

## 2015-12-17 NOTE — Anesthesia Preprocedure Evaluation (Signed)
Anesthesia Evaluation  Patient identified by MRN, date of birth, ID band Patient awake    Reviewed: Allergy & Precautions, NPO status , Patient's Chart, lab work & pertinent test results  Airway Mallampati: IV  TM Distance: >3 FB Neck ROM: Limited    Dental  (+) Missing, Poor Dentition   Pulmonary shortness of breath and with exertion, sleep apnea and Continuous Positive Airway Pressure Ventilation ,    Pulmonary exam normal        Cardiovascular Exercise Tolerance: Poor hypertension, Pt. on medications + Peripheral Vascular Disease  Normal cardiovascular exam     Neuro/Psych Depression Bipolar Disorder    GI/Hepatic   Endo/Other  diabetes, Type 2BG 167.  Renal/GU      Musculoskeletal   Abdominal (+) + obese,  Abdomen: soft.    Peds  Hematology   Anesthesia Other Findings   Reproductive/Obstetrics                             Anesthesia Physical Anesthesia Plan  ASA: III  Anesthesia Plan: General   Post-op Pain Management:    Induction: Intravenous  Airway Management Planned: Mask  Additional Equipment:   Intra-op Plan:   Post-operative Plan:   Informed Consent: I have reviewed the patients History and Physical, chart, labs and discussed the procedure including the risks, benefits and alternatives for the proposed anesthesia with the patient or authorized representative who has indicated his/her understanding and acceptance.   Dental advisory given  Plan Discussed with: CRNA  Anesthesia Plan Comments:         Anesthesia Quick Evaluation

## 2015-12-17 NOTE — Anesthesia Postprocedure Evaluation (Signed)
Anesthesia Post Note  Patient: Christian [name]  Procedure(s) Performed: * No procedures listed *  Patient location during evaluation: PACU Anesthesia Type: General Level of consciousness: awake Pain management: pain level controlled Vital Signs Assessment: post-procedure vital signs reviewed and stable Respiratory status: spontaneous breathing Cardiovascular status: blood pressure returned to baseline Postop Assessment: no headache Anesthetic complications: no    Last Vitals:  Filed Vitals:   12/17/15 1200 12/17/15 1215  BP: 174/126 171/130  Pulse: 104 103  Temp: 38.2 C 38.2 C  Resp: 16 17    Last Pain:  Filed Vitals:   12/17/15 1221  PainSc: Asleep                 Vertis Scheib M

## 2015-12-17 NOTE — Anesthesia Procedure Notes (Signed)
Date/Time: 12/17/2015 11:58 AM Performed by: Lily Kocher Pre-anesthesia Checklist: Patient identified, Emergency Drugs available, Suction available and Patient being monitored Patient Re-evaluated:Patient Re-evaluated prior to inductionOxygen Delivery Method: Circle system utilized Preoxygenation: Pre-oxygenation with 100% oxygen Intubation Type: IV induction Ventilation: Mask ventilation without difficulty and Mask ventilation throughout procedure Airway Equipment and Method: Bite block Placement Confirmation: positive ETCO2 Dental Injury: Teeth and Oropharynx as per pre-operative assessment

## 2015-12-17 NOTE — H&P (Signed)
Christian [name] is an 65 y.o. male.   Chief Complaint: Patient with no specific complaints but still feels down energy level low low activity HPI: Recurrent depression as part of bipolar disorder starting to slip back into a lower mood not yet having psychotic symptoms  Past Medical History  Diagnosis Date  . Mood swings (HCC)   . Type II or unspecified type diabetes mellitus with unspecified complication, uncontrolled 06/02/2014  . Other and unspecified hyperlipidemia 06/02/2014  . OSA on CPAP 06/02/2014  . 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 Bipolar disorder (HCC)   . Hypertension   . Gout   . Bipolar 1 disorder (HCC)   . Hx of blood clots     History reviewed. No pertinent past surgical history.  Family History  Problem Relation Age of Onset  . Dementia Mother   . Arthritis Father   . Hypertension Father   . Heart failure Neg Hx   . Kidney failure Neg Hx   . Cancer Neg Hx   . Mental illness Sister   . Diabetes Sister    Social History:  reports that he has never smoked. He does not have any smokeless tobacco history on file. He reports that he does not drink alcohol or use illicit drugs.  Allergies: No Known Allergies   (Not in a hospital admission)  Results for orders placed or performed during the hospital encounter of 12/17/15 (from the past 48 hour(s))  Glucose, capillary     Status: Abnormal   Collection Time: 12/17/15  9:32 AM  Result Value Ref Range   Glucose-Capillary 167 (H) 65 - 99 mg/dL   Comment 1 Notify RN    No results found.  Review of Systems  Constitutional: Negative.   HENT: Negative.   Eyes: Negative.   Respiratory: Negative.   Cardiovascular: Negative.   Gastrointestinal: Negative.   Musculoskeletal: Negative.   Skin: Negative.   Neurological: Negative.   Psychiatric/Behavioral: Positive for depression. Negative for suicidal ideas, hallucinations, memory loss and substance abuse. The patient is not  nervous/anxious and does not have insomnia.     Pulse 122, temperature 99.4 F (37.4 C), resp. rate 18, height 5\' 10"  (1.778 m), weight 107.502 kg (237 lb), SpO2 99 %. Physical Exam  Nursing note and vitals reviewed. Constitutional: He appears well-developed and well-nourished.  HENT:  Head: Normocephalic and atraumatic.  Eyes: Conjunctivae are normal. Pupils are equal, round, and reactive to light.  Neck: Normal range of motion.  Cardiovascular: Normal rate, regular rhythm and normal heart sounds.   Respiratory: Effort normal and breath sounds normal. No respiratory distress.  GI: Soft.  Musculoskeletal: Normal range of motion.  Neurological: He is alert.  Skin: Skin is warm and dry.  Psychiatric: Judgment and thought content normal. His affect is blunt. His speech is delayed. He is slowed. Cognition and memory are normal.     Assessment/Plan Patient is receiving ECT today. Because of his schedule he will not be old to come back until a week from Friday. Next treatment scheduled for January 27.  [name] 12/17/2015, 11:46 AM

## 2015-12-17 NOTE — Procedures (Addendum)
ECT SERVICES Physician's Interval Evaluation & Treatment Note  Patient Identification: Christian [name] MRN:  [id number] Date of Evaluation:  12/17/2015 TX #: 23  MADRS: 23  MMSE: 30  P.E. Findings:  Physical exam unchanged lungs and heart normal  Psychiatric Interval Note:  Mood is still down and flat no psychotic symptoms  Subjective:  Patient is a 65 y.o. male seen for evaluation for Electroconvulsive Therapy. Feeling low energy  Treatment Summary:   []   Right Unilateral             [x]  Bilateral   % Energy : 1.0 ms 100%   Impedance: 870 ohms  Seizure Energy Index: 1683 V squared  Postictal Suppression Index: 57%  Seizure Concordance Index: 98%  Medications  Pre Shock: Xylocaine 4 mg, labetalol 20 mg, ketamine 120 mg, succinylcholine 150 mg  Post Shock:  Labetalol 20 mg  Seizure Duration: 29 seconds by EMG 59 seconds by EEG   Comments: We will follow-up on January 27 which is the earliest he will be able to calm.   Lungs:  [x]   Clear to auscultation               []  Other:   Heart:    [x]   Regular rhythm             []  irregular rhythm    [x]   Previous H&P reviewed, patient examined and there are NO CHANGES                 []   Previous H&P reviewed, patient examined and there are changes noted.   [name], MD 1/18/201711:49 AM

## 2015-12-17 NOTE — Transfer of Care (Signed)
Immediate Anesthesia Transfer of Care Note  Patient: Christian [name]  Procedure(s) Performed: ECT  Patient Location: PACU  Anesthesia Type:General  Level of Consciousness: sedated  Airway & Oxygen Therapy: Patient Spontanous Breathing and Patient connected to face mask oxygen  Post-op Assessment: Report given to RN  Post vital signs: Reviewed and stable  Last Vitals:  Filed Vitals:   12/17/15 0926 12/17/15 1200  BP:  174/126  Pulse: 122 104  Temp: 37.4 C 38.2 C  Resp: 18 16    Complications: No apparent anesthesia complications

## 2015-12-21 ENCOUNTER — Encounter: Payer: Self-pay | Admitting: Emergency Medicine

## 2015-12-21 ENCOUNTER — Emergency Department
Admission: EM | Admit: 2015-12-21 | Discharge: 2015-12-22 | Disposition: A | Discharge status: Other Acute Inpatient Hospital | Payer: Commercial Managed Care - HMO | Attending: Student | Admitting: Student

## 2015-12-21 DIAGNOSIS — F315 Bipolar disorder, current episode depressed, severe, with psychotic features: Secondary | ICD-10-CM | POA: Diagnosis present

## 2015-12-21 DIAGNOSIS — Z7984 Long term (current) use of oral hypoglycemic drugs: Secondary | ICD-10-CM | POA: Insufficient documentation

## 2015-12-21 DIAGNOSIS — M25511 Pain in right shoulder: Secondary | ICD-10-CM | POA: Diagnosis not present

## 2015-12-21 DIAGNOSIS — F314 Bipolar disorder, current episode depressed, severe, without psychotic features: Secondary | ICD-10-CM | POA: Insufficient documentation

## 2015-12-21 DIAGNOSIS — R45850 Homicidal ideations: Secondary | ICD-10-CM | POA: Insufficient documentation

## 2015-12-21 DIAGNOSIS — Z79899 Other long term (current) drug therapy: Secondary | ICD-10-CM | POA: Insufficient documentation

## 2015-12-21 DIAGNOSIS — M25512 Pain in left shoulder: Secondary | ICD-10-CM | POA: Diagnosis not present

## 2015-12-21 DIAGNOSIS — R45851 Suicidal ideations: Secondary | ICD-10-CM | POA: Insufficient documentation

## 2015-12-21 DIAGNOSIS — G8921 Chronic pain due to trauma: Secondary | ICD-10-CM | POA: Insufficient documentation

## 2015-12-21 DIAGNOSIS — I10 Essential (primary) hypertension: Secondary | ICD-10-CM | POA: Diagnosis not present

## 2015-12-21 DIAGNOSIS — F250 Schizoaffective disorder, bipolar type: Secondary | ICD-10-CM | POA: Diagnosis not present

## 2015-12-21 DIAGNOSIS — Z7982 Long term (current) use of aspirin: Secondary | ICD-10-CM | POA: Insufficient documentation

## 2015-12-21 DIAGNOSIS — E118 Type 2 diabetes mellitus with unspecified complications: Secondary | ICD-10-CM | POA: Insufficient documentation

## 2015-12-21 LAB — CBC
HCT: 41.2 % (ref 40.0–52.0)
Hemoglobin: 13.6 g/dL (ref 13.0–18.0)
MCH: 28.8 pg (ref 26.0–34.0)
MCHC: 33.1 g/dL (ref 32.0–36.0)
MCV: 87.1 fL (ref 80.0–100.0)
Platelets: 184 10*3/uL (ref 150–440)
RBC: 4.73 MIL/uL (ref 4.40–5.90)
RDW: 12.5 % (ref 11.5–14.5)
WBC: 4.4 10*3/uL (ref 3.8–10.6)

## 2015-12-21 LAB — COMPREHENSIVE METABOLIC PANEL
ALT: 19 U/L (ref 17–63)
AST: 17 U/L (ref 15–41)
Albumin: 3.8 g/dL (ref 3.5–5.0)
Alkaline Phosphatase: 81 U/L (ref 38–126)
Anion gap: 8 (ref 5–15)
BUN: 24 mg/dL — ABNORMAL HIGH (ref 6–20)
CO2: 28 mmol/L (ref 22–32)
Calcium: 9.7 mg/dL (ref 8.9–10.3)
Chloride: 103 mmol/L (ref 101–111)
Creatinine, Ser: 1.21 mg/dL (ref 0.61–1.24)
GFR calc Af Amer: >60 mL/min (ref >60)
GFR calc non Af Amer: >60 mL/min (ref >60)
Glucose, Bld: 274 mg/dL — ABNORMAL HIGH (ref 65–99)
Potassium: 4.3 mmol/L (ref 3.5–5.1)
Sodium: 139 mmol/L (ref 135–145)
Total Bilirubin: 0.9 mg/dL (ref 0.3–1.2)
Total Protein: 7.8 g/dL (ref 6.5–8.1)

## 2015-12-21 LAB — APTT: aPTT: 28 seconds (ref 24–36)

## 2015-12-21 LAB — SALICYLATE LEVEL: Salicylate Lvl: <4 mg/dL (ref 2.8–30.0)

## 2015-12-21 LAB — GLUCOSE, CAPILLARY: Glucose-Capillary: 279 mg/dL — ABNORMAL HIGH (ref 65–99)

## 2015-12-21 LAB — ETHANOL: Alcohol, Ethyl (B): <5 mg/dL (ref <5)

## 2015-12-21 LAB — ACETAMINOPHEN LEVEL: Acetaminophen (Tylenol), Serum: <10 ug/mL — ABNORMAL LOW (ref 10–30)

## 2015-12-21 LAB — PROTIME-INR
INR: 1.19
Prothrombin Time: 15.3 seconds — ABNORMAL HIGH (ref 11.4–15.0)

## 2015-12-21 MED ORDER — LISINOPRIL 20 MG PO TABS
40.0000 mg | ORAL_TABLET | Freq: Once | ORAL | Status: AC
  Administered 2015-12-21: 40 mg via ORAL
  Filled 2015-12-21: qty 2

## 2015-12-21 MED ORDER — OLANZAPINE 10 MG PO TABS
30.0000 mg | ORAL_TABLET | Freq: Once | ORAL | Status: AC
  Administered 2015-12-21: 30 mg via ORAL
  Filled 2015-12-21: qty 3

## 2015-12-21 MED ORDER — MIRTAZAPINE 15 MG PO TABS
45.0000 mg | ORAL_TABLET | Freq: Once | ORAL | Status: AC
  Administered 2015-12-21: 45 mg via ORAL
  Filled 2015-12-21: qty 3

## 2015-12-21 MED ORDER — ACETAMINOPHEN 325 MG PO TABS
650.0000 mg | ORAL_TABLET | Freq: Once | ORAL | Status: AC
  Administered 2015-12-21: 650 mg via ORAL

## 2015-12-21 MED ORDER — ACETAMINOPHEN 325 MG PO TABS
ORAL_TABLET | ORAL | Status: AC
  Administered 2015-12-21: 650 mg via ORAL
  Filled 2015-12-21: qty 2

## 2015-12-21 MED ORDER — CLONAZEPAM 1 MG PO TABS
1.0000 mg | ORAL_TABLET | Freq: Once | ORAL | Status: AC
  Administered 2015-12-21: 1 mg via ORAL
  Filled 2015-12-21: qty 1

## 2015-12-21 MED ORDER — METFORMIN HCL 500 MG PO TABS
1000.0000 mg | ORAL_TABLET | Freq: Once | ORAL | Status: AC
  Administered 2015-12-21: 1000 mg via ORAL
  Filled 2015-12-21: qty 2

## 2015-12-21 NOTE — ED Notes (Signed)
Patient resting quietly in room. No noted distress or abnormal behaviors noted. Will continue 15 minute checks and observation by security camera for safety. 

## 2015-12-21 NOTE — ED Notes (Signed)
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 MD ordered tylenol. Given as ordered.
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
## 2015-12-21 NOTE — ED Notes (Signed)
Patient assigned to appropriate care area. Patient oriented to unit/care area: Informed that, for their safety, care areas are designed for safety and monitored by security cameras at all times; and visiting hours explained to patient. Patient verbalizes understanding, and verbal contract for safety obtained. 

## 2015-12-21 NOTE — ED Notes (Signed)
Pt will have ECT in the morning and be evaluated by Dr. Toni Amend.

## 2015-12-21 NOTE — ED Notes (Signed)
Pt was seen by psychiatrist. Pt resting in his room. No concerns voiced. No distress noted. Maintained on 15 minute checks and observation by security camera for safety.
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
## 2015-12-21 NOTE — ED Notes (Signed)
Pt watching football in room. Cooperative. No complaints. No distress noted. Maintained on 15 minute checks and observation by security camera for safety.

## 2015-12-22 ENCOUNTER — Emergency Department: Payer: Commercial Managed Care - HMO | Admitting: *Deleted

## 2015-12-22 ENCOUNTER — Inpatient Hospital Stay: Admit: 2015-12-22 | Payer: Self-pay | Admitting: Psychiatry

## 2015-12-22 ENCOUNTER — Encounter
Admission: RE | Admit: 2015-12-22 | Discharge: 2015-12-22 | Disposition: A | Discharge status: Home / Self Care | Payer: Commercial Managed Care - HMO | Source: Ambulatory Visit | Attending: Psychiatry | Admitting: Psychiatry

## 2015-12-22 ENCOUNTER — Inpatient Hospital Stay
Admission: EM | Admit: 2015-12-22 | Discharge: 2016-02-03 | DRG: 885 | Payer: Commercial Managed Care - HMO | Source: Intra-hospital | Attending: Psychiatry | Admitting: Psychiatry

## 2015-12-22 DIAGNOSIS — R45851 Suicidal ideations: Secondary | ICD-10-CM | POA: Insufficient documentation

## 2015-12-22 DIAGNOSIS — Z833 Family history of diabetes mellitus: Secondary | ICD-10-CM | POA: Diagnosis not present

## 2015-12-22 DIAGNOSIS — E785 Hyperlipidemia, unspecified: Secondary | ICD-10-CM | POA: Diagnosis not present

## 2015-12-22 DIAGNOSIS — F319 Bipolar disorder, unspecified: Secondary | ICD-10-CM | POA: Diagnosis not present

## 2015-12-22 DIAGNOSIS — F314 Bipolar disorder, current episode depressed, severe, without psychotic features: Secondary | ICD-10-CM | POA: Diagnosis not present

## 2015-12-22 DIAGNOSIS — S01119A Laceration without foreign body of unspecified eyelid and periocular area, initial encounter: Secondary | ICD-10-CM | POA: Insufficient documentation

## 2015-12-22 DIAGNOSIS — E860 Dehydration: Secondary | ICD-10-CM | POA: Diagnosis present

## 2015-12-22 DIAGNOSIS — G8921 Chronic pain due to trauma: Secondary | ICD-10-CM | POA: Diagnosis not present

## 2015-12-22 DIAGNOSIS — M109 Gout, unspecified: Secondary | ICD-10-CM | POA: Diagnosis present

## 2015-12-22 DIAGNOSIS — M25512 Pain in left shoulder: Secondary | ICD-10-CM | POA: Diagnosis not present

## 2015-12-22 DIAGNOSIS — E119 Type 2 diabetes mellitus without complications: Secondary | ICD-10-CM | POA: Insufficient documentation

## 2015-12-22 DIAGNOSIS — E1122 Type 2 diabetes mellitus with diabetic chronic kidney disease: Secondary | ICD-10-CM | POA: Diagnosis not present

## 2015-12-22 DIAGNOSIS — F3164 Bipolar disorder, current episode mixed, severe, with psychotic features: Secondary | ICD-10-CM | POA: Diagnosis present

## 2015-12-22 DIAGNOSIS — R251 Tremor, unspecified: Secondary | ICD-10-CM | POA: Diagnosis present

## 2015-12-22 DIAGNOSIS — Z8261 Family history of arthritis: Secondary | ICD-10-CM

## 2015-12-22 DIAGNOSIS — Z86718 Personal history of other venous thrombosis and embolism: Secondary | ICD-10-CM | POA: Diagnosis not present

## 2015-12-22 DIAGNOSIS — G8929 Other chronic pain: Secondary | ICD-10-CM | POA: Diagnosis present

## 2015-12-22 DIAGNOSIS — F315 Bipolar disorder, current episode depressed, severe, with psychotic features: Secondary | ICD-10-CM | POA: Insufficient documentation

## 2015-12-22 DIAGNOSIS — N183 Chronic kidney disease, stage 3 (moderate): Secondary | ICD-10-CM | POA: Diagnosis not present

## 2015-12-22 DIAGNOSIS — N400 Enlarged prostate without lower urinary tract symptoms: Secondary | ICD-10-CM | POA: Diagnosis present

## 2015-12-22 DIAGNOSIS — I129 Hypertensive chronic kidney disease with stage 1 through stage 4 chronic kidney disease, or unspecified chronic kidney disease: Secondary | ICD-10-CM | POA: Diagnosis present

## 2015-12-22 DIAGNOSIS — Z820 Family history of epilepsy and other diseases of the nervous system: Secondary | ICD-10-CM | POA: Diagnosis not present

## 2015-12-22 DIAGNOSIS — I739 Peripheral vascular disease, unspecified: Secondary | ICD-10-CM | POA: Diagnosis not present

## 2015-12-22 DIAGNOSIS — G4733 Obstructive sleep apnea (adult) (pediatric): Secondary | ICD-10-CM | POA: Diagnosis not present

## 2015-12-22 DIAGNOSIS — F329 Major depressive disorder, single episode, unspecified: Secondary | ICD-10-CM | POA: Diagnosis not present

## 2015-12-22 DIAGNOSIS — Z818 Family history of other mental and behavioral disorders: Secondary | ICD-10-CM

## 2015-12-22 DIAGNOSIS — Z8249 Family history of ischemic heart disease and other diseases of the circulatory system: Secondary | ICD-10-CM | POA: Diagnosis not present

## 2015-12-22 DIAGNOSIS — E118 Type 2 diabetes mellitus with unspecified complications: Secondary | ICD-10-CM | POA: Diagnosis not present

## 2015-12-22 DIAGNOSIS — M25511 Pain in right shoulder: Secondary | ICD-10-CM | POA: Diagnosis not present

## 2015-12-22 DIAGNOSIS — S01111A Laceration without foreign body of right eyelid and periocular area, initial encounter: Secondary | ICD-10-CM | POA: Diagnosis not present

## 2015-12-22 DIAGNOSIS — I10 Essential (primary) hypertension: Secondary | ICD-10-CM | POA: Insufficient documentation

## 2015-12-22 DIAGNOSIS — E669 Obesity, unspecified: Secondary | ICD-10-CM | POA: Diagnosis not present

## 2015-12-22 DIAGNOSIS — Z86711 Personal history of pulmonary embolism: Secondary | ICD-10-CM | POA: Diagnosis not present

## 2015-12-22 DIAGNOSIS — N179 Acute kidney failure, unspecified: Secondary | ICD-10-CM | POA: Diagnosis not present

## 2015-12-22 DIAGNOSIS — Z9181 History of falling: Secondary | ICD-10-CM | POA: Diagnosis not present

## 2015-12-22 DIAGNOSIS — Z915 Personal history of self-harm: Secondary | ICD-10-CM | POA: Diagnosis not present

## 2015-12-22 DIAGNOSIS — W19XXXA Unspecified fall, initial encounter: Secondary | ICD-10-CM | POA: Diagnosis not present

## 2015-12-22 DIAGNOSIS — R45850 Homicidal ideations: Secondary | ICD-10-CM | POA: Diagnosis not present

## 2015-12-22 DIAGNOSIS — R600 Localized edema: Secondary | ICD-10-CM | POA: Insufficient documentation

## 2015-12-22 DIAGNOSIS — S0990XA Unspecified injury of head, initial encounter: Secondary | ICD-10-CM | POA: Diagnosis not present

## 2015-12-22 DIAGNOSIS — Z6830 Body mass index (BMI) 30.0-30.9, adult: Secondary | ICD-10-CM | POA: Diagnosis not present

## 2015-12-22 DIAGNOSIS — Z79899 Other long term (current) drug therapy: Secondary | ICD-10-CM | POA: Diagnosis not present

## 2015-12-22 HISTORY — DX: Chronic kidney disease, unspecified: N18.9

## 2015-12-22 LAB — GLUCOSE, CAPILLARY
Glucose-Capillary: 120 mg/dL — ABNORMAL HIGH (ref 65–99)
Glucose-Capillary: 179 mg/dL — ABNORMAL HIGH (ref 65–99)

## 2015-12-22 MED ORDER — ALUM & MAG HYDROXIDE-SIMETH 200-200-20 MG/5ML PO SUSP: 30.0000 mL | ORAL | Status: DC | PRN

## 2015-12-22 MED ORDER — MAGNESIUM HYDROXIDE 400 MG/5ML PO SUSP: 30.0000 mL | Freq: Every day | ORAL | Status: DC | PRN

## 2015-12-22 MED ORDER — LISINOPRIL 20 MG PO TABS
40.0000 mg | ORAL_TABLET | Freq: Every day | ORAL | Status: DC
  Administered 2015-12-22: 40 mg via ORAL

## 2015-12-22 MED ORDER — ATORVASTATIN CALCIUM 20 MG PO TABS
10.0000 mg | ORAL_TABLET | Freq: Every day | ORAL | Status: DC
  Administered 2015-12-22 – 2016-02-02 (×42): 10 mg via ORAL
  Filled 2015-12-22 (×8): qty 1
  Filled 2015-12-22: qty 2
  Filled 2015-12-22 (×2): qty 1
  Filled 2015-12-22: qty 2
  Filled 2015-12-22 (×18): qty 1
  Filled 2015-12-22: qty 2
  Filled 2015-12-22 (×11): qty 1
  Filled 2015-12-22: qty 2

## 2015-12-22 MED ORDER — KETAMINE HCL 10 MG/ML IJ SOLN
INTRAMUSCULAR | Status: DC | PRN
  Administered 2015-12-22: 120 mg via INTRAVENOUS

## 2015-12-22 MED ORDER — ACETAMINOPHEN 325 MG PO TABS
650.0000 mg | ORAL_TABLET | Freq: Four times a day (QID) | ORAL | Status: DC | PRN
  Administered 2015-12-23 – 2016-01-26 (×6): 650 mg via ORAL
  Filled 2015-12-22 (×8): qty 2

## 2015-12-22 MED ORDER — MIRTAZAPINE 15 MG PO TABS
45.0000 mg | ORAL_TABLET | Freq: Every day | ORAL | Status: DC
  Administered 2015-12-22 – 2016-02-02 (×43): 45 mg via ORAL
  Filled 2015-12-22 (×22): qty 3
  Filled 2015-12-22: qty 1
  Filled 2015-12-22 (×21): qty 3

## 2015-12-22 MED ORDER — FINASTERIDE 5 MG PO TABS
5.0000 mg | ORAL_TABLET | Freq: Every day | ORAL | Status: DC
  Administered 2015-12-22 – 2016-02-03 (×44): 5 mg via ORAL
  Filled 2015-12-22 (×45): qty 1

## 2015-12-22 MED ORDER — ALLOPURINOL 300 MG PO TABS
300.0000 mg | ORAL_TABLET | Freq: Every day | ORAL | Status: DC
  Administered 2015-12-22 – 2016-01-17 (×27): 300 mg via ORAL
  Filled 2015-12-22 (×30): qty 1

## 2015-12-22 MED ORDER — SODIUM CHLORIDE 0.9 % IV SOLN
INTRAVENOUS | Status: DC | PRN
  Administered 2015-12-22: 12:00:00 via INTRAVENOUS

## 2015-12-22 MED ORDER — LABETALOL HCL 5 MG/ML IV SOLN
INTRAVENOUS | Status: DC | PRN
  Administered 2015-12-22 (×2): 20 mg via INTRAVENOUS

## 2015-12-22 MED ORDER — SUCCINYLCHOLINE CHLORIDE 20 MG/ML IJ SOLN
INTRAMUSCULAR | Status: DC | PRN
  Administered 2015-12-22: 150 mg via INTRAVENOUS

## 2015-12-22 MED ORDER — APIXABAN 5 MG PO TABS
5.0000 mg | ORAL_TABLET | Freq: Every day | ORAL | Status: DC
  Administered 2015-12-22 – 2015-12-25 (×4): 5 mg via ORAL
  Filled 2015-12-22 (×5): qty 1

## 2015-12-22 MED ORDER — LISINOPRIL 20 MG PO TABS
ORAL_TABLET | ORAL | Status: AC
  Filled 2015-12-22: qty 2

## 2015-12-22 MED ORDER — ASPIRIN EC 81 MG PO TBEC
81.0000 mg | DELAYED_RELEASE_TABLET | Freq: Every day | ORAL | Status: DC
  Administered 2015-12-22 – 2016-02-03 (×44): 81 mg via ORAL
  Filled 2015-12-22 (×44): qty 1

## 2015-12-22 MED ORDER — ACETAMINOPHEN 325 MG PO TABS: 650.0000 mg | ORAL_TABLET | Freq: Four times a day (QID) | ORAL | Status: DC | PRN

## 2015-12-22 MED ORDER — OLANZAPINE 10 MG PO TABS
20.0000 mg | ORAL_TABLET | Freq: Every day | ORAL | Status: DC
  Administered 2015-12-22 – 2015-12-30 (×9): 20 mg via ORAL
  Filled 2015-12-22 (×9): qty 2

## 2015-12-22 MED ORDER — DOCUSATE SODIUM 100 MG PO CAPS
100.0000 mg | ORAL_CAPSULE | Freq: Two times a day (BID) | ORAL | Status: DC
  Administered 2015-12-23 – 2016-02-03 (×64): 100 mg via ORAL
  Filled 2015-12-22 (×83): qty 1

## 2015-12-22 MED ORDER — SODIUM CHLORIDE 0.9 % IV SOLN
250.0000 mL | Freq: Once | INTRAVENOUS | Status: AC
  Administered 2015-12-22: 250 mL via INTRAVENOUS

## 2015-12-22 MED ORDER — LABETALOL HCL 5 MG/ML IV SOLN
10.0000 mg | INTRAVENOUS | Status: AC | PRN
  Administered 2015-12-22 (×2): 10 mg via INTRAVENOUS

## 2015-12-22 MED ORDER — TAMSULOSIN HCL 0.4 MG PO CAPS
0.4000 mg | ORAL_CAPSULE | Freq: Every day | ORAL | Status: DC
  Administered 2015-12-22 – 2016-02-02 (×42): 0.4 mg via ORAL
  Filled 2015-12-22 (×43): qty 1

## 2015-12-22 MED ORDER — MAGNESIUM HYDROXIDE 400 MG/5ML PO SUSP
30.0000 mL | Freq: Every day | ORAL | Status: DC | PRN
  Administered 2016-01-01 – 2016-01-08 (×4): 30 mL via ORAL
  Filled 2015-12-22 (×4): qty 30

## 2015-12-22 MED ORDER — METFORMIN HCL 500 MG PO TABS
500.0000 mg | ORAL_TABLET | Freq: Two times a day (BID) | ORAL | Status: DC
  Administered 2015-12-22 – 2016-01-17 (×48): 500 mg via ORAL
  Filled 2015-12-22 (×49): qty 1

## 2015-12-22 MED ORDER — POLYETHYLENE GLYCOL 3350 17 G PO PACK
17.0000 g | PACK | Freq: Every day | ORAL | Status: DC
  Administered 2015-12-27 – 2016-02-03 (×29): 17 g via ORAL
  Filled 2015-12-22 (×33): qty 1

## 2015-12-22 MED ORDER — FUROSEMIDE 20 MG PO TABS
20.0000 mg | ORAL_TABLET | Freq: Every day | ORAL | Status: DC
  Administered 2015-12-23 – 2016-01-17 (×26): 20 mg via ORAL
  Filled 2015-12-22 (×26): qty 1

## 2015-12-22 NOTE — Tx Team (Signed)
Initial Interdisciplinary Treatment Plan   PATIENT STRESSORS: Health problems   PATIENT STRENGTHS: Motivation for treatment/growth Supportive family/friends   PROBLEM LIST: Problem List/Patient Goals Date to be addressed Date deferred Reason deferred Estimated date of resolution  Depressed Mood      Suicidal Risk      Homicidal Ideation                                           DISCHARGE CRITERIA:  Ability to meet basic life and health needs Improved stabilization in mood, thinking, and/or behavior Medical problems require only outpatient monitoring Motivation to continue treatment in a less acute level of care Verbal commitment to aftercare and medication compliance  PRELIMINARY DISCHARGE PLAN: Attend aftercare/continuing care group Outpatient therapy Return to previous living arrangement  PATIENT/FAMIILY INVOLVEMENT: This treatment plan has been presented to and reviewed with the patient, Christian [name].  The patient and family have been given the opportunity to ask questions and make suggestions.  [name] 12/22/2015, 7:07 PM

## 2015-12-22 NOTE — ED Notes (Signed)
Waiting for room to be ready on [facility] med then will dc

## 2015-12-22 NOTE — [facility] Suicide Risk Assessment (Signed)
[facility] Admission Suicide Risk Assessment   Nursing information obtained from:    Demographic factors:    Current Mental Status:    Loss Factors:    Historical Factors:    Risk Reduction Factors:     Total Time spent with patient: 30 minutes Principal Problem: <principal problem not specified> Diagnosis:   Patient Active Problem List   Diagnosis Date Noted  . Bipolar I disorder, most recent episode depressed, severe w psychosis (HCC) [F31.5] 12/22/2015  . Bipolar affective disorder, depressed, severe, with psychotic behavior (HCC) [F31.5]   . Bipolar I disorder, current or most recent episode depressed, with psychotic features (HCC) [F31.5]   . Bipolar I disorder, most recent episode depressed (HCC) [F31.30]   . Severe bipolar I disorder with depression (HCC) [F31.4]   . Bipolar I disorder, most recent episode depressed, severe with psychotic features (HCC) [F31.5]   . Overdose [T50.901A] 09/11/2015  . Acute respiratory failure with hypoxia (HCC) [J96.01] 09/05/2015  . Elevated troponin [R79.89] 09/05/2015  . Somnolence [R40.0] 09/05/2015  . Bipolar depression (HCC) [F31.30] 09/05/2015  . Acute bilateral deep vein thrombosis (DVT) of femoral veins (HCC) [I82.413] 09/05/2015  . Bilateral pulmonary embolism (HCC) [I26.99] 09/02/2015  . Labile hypertension [I10]   . Pulmonary embolism with acute cor pulmonale (HCC) [I26.09]   . Dyspnea [R06.00]   . Orthostatic hypotension [I95.1]   . Bipolar disorder, in partial remission, most recent episode depressed (HCC) [F31.75]   . Syncope, near [R55] 08/31/2015  . Affective psychosis, bipolar (HCC) [F31.9]   . Bipolar disorder, now depressed (HCC) [F31.30] 08/28/2015  . Bipolar I disorder, severe, current or most recent episode depressed, with psychotic features (HCC) [F31.5]   . Pressure ulcer [L89.90] 07/30/2015  . Protein-calorie malnutrition, severe (HCC) [E43] 07/30/2015  . Dehydration [E86.0] 07/29/2015  . Bipolar disorder, current  episode depressed, severe, with psychotic features (HCC) [F31.5] 07/09/2015  . Diabetes (HCC) [E11.9] 07/09/2015  . UTI (urinary tract infection) [N39.0] 06/19/2014  . Positive blood culture [R78.81] 06/19/2014  . Bipolar affective disorder, current episode manic with psychotic symptoms (HCC) [F31.2] 06/17/2014  . Type II or unspecified type diabetes mellitus with unspecified complication, uncontrolled [E11.8, E11.65] 06/02/2014  . Other and unspecified hyperlipidemia [E78.5] 06/02/2014  . OSA on CPAP [G47.33] 06/02/2014  . 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 Gout [M10.9] 06/02/2014  . Hypertension [I10]    Subjective Data: Patient with bipolar disorder severe with psychotic depression. Multiple suicide attempts in the past. Recent aggression towards his wife. Extremely depressed with multiple symptoms of severe depression patient denies having any current suicidal thoughts  Continued Clinical Symptoms:    The "Alcohol Use Disorders Identification Test", Guidelines for Use in Primary Care, Second Edition.  World Science writer [facility]). Score between 0-7:  no or low risk or alcohol related problems. Score between 8-15:  moderate risk of alcohol related problems. Score between 16-19:  high risk of alcohol related problems. Score 20 or above:  warrants further diagnostic evaluation for alcohol dependence and treatment.   CLINICAL FACTORS:   Bipolar Disorder:   Depressive phase   Musculoskeletal: Strength & Muscle Tone: decreased Gait & Station: ataxic Patient leans: N/A  Psychiatric Specialty Exam: ROS  Blood pressure 131/87, pulse 93, temperature 99.1 F (37.3 C), temperature source Oral, resp. rate 20, height 6' (1.829 m), weight 108.863 kg (240 lb).Body mass index is 32.54 kg/(m^2).  General Appearance: Disheveled  Eye Contact::  Fair  Speech:  Slow and Slurred  Volume:  Decreased  Mood:  Depressed  Affect:  Depressed   Thought Process:  Circumstantial  Orientation:  Full (Time, Place, and Person)  Thought Content:  Paranoid Ideation  Suicidal Thoughts:  Yes.  without intent/plan  Homicidal Thoughts:  Yes.  without intent/plan  Memory:  Negative  Judgement:  Poor  Insight:  Shallow  Psychomotor Activity:  Psychomotor Retardation  Concentration:  Poor  Recall:  Fair  Fund of Knowledge:Fair  Language: Fair  Akathisia:  No  Handed:  Right  AIMS (if indicated):     Assets:  Desire for Improvement Resilience Social Support  Sleep:     Cognition: Impaired,  Moderate  ADL's:  Intact    COGNITIVE FEATURES THAT CONTRIBUTE TO RISK:  Loss of executive function    SUICIDE RISK:   Moderate:  Frequent suicidal ideation with limited intensity, and duration, some specificity in terms of plans, no associated intent, good self-control, limited dysphoria/symptomatology, some risk factors present, and identifiable protective factors, including available and accessible social support.  PLAN OF CARE: Patient is at moderate suicide risk. He is on 11 precautions for now. We will continue daily evaluation and discussion with the treatment team before making a change in precautions. He is receiving ECT treatment as well as medication for bipolar depression  I certify that inpatient services furnished can reasonably be expected to improve the patient's condition.   [name], MD 12/22/2015, 5:25 PM

## 2015-12-22 NOTE — ED Notes (Signed)
Pt taken to ect with police escort is on ivc , and then to be adm to beh med

## 2015-12-22 NOTE — Anesthesia Postprocedure Evaluation (Signed)
Anesthesia Post Note  Patient: Christian [name]  Procedure(s) Performed: * No procedures listed *  Patient location during evaluation: PACU Anesthesia Type: General Level of consciousness: awake Pain management: pain level controlled Vital Signs Assessment: post-procedure vital signs reviewed and stable Respiratory status: spontaneous breathing Cardiovascular status: blood pressure returned to baseline Postop Assessment: no headache Anesthetic complications: no    Last Vitals:  Filed Vitals:   12/22/15 1034 12/22/15 1200  BP: 145/95 177/113  Pulse: 98 98  Temp: 36.9 C 36.8 C  Resp: 18 20    Last Pain: There were no vitals filed for this visit.               Maria Coin M

## 2015-12-22 NOTE — Transfer of Care (Signed)
Immediate Anesthesia Transfer of Care Note  Patient: Christian [name]  Procedure(s) Performed: * No procedures listed *  Patient Location: PACU  Anesthesia Type:General  Level of Consciousness: awake and sedated  Airway & Oxygen Therapy: Patient Spontanous Breathing and Patient connected to face mask oxygen  Post-op Assessment: Report given to RN and Post -op Vital signs reviewed and stable  Post vital signs: Reviewed and stable  Last Vitals:  Filed Vitals:   12/22/15 1034  BP: 145/95  Pulse: 98  Temp: 36.9 C  Resp: 18    Complications: No apparent anesthesia complications

## 2015-12-22 NOTE — ED Notes (Signed)

## 2015-12-22 NOTE — ED Provider Notes (Signed)
-----------------------------------------   7:00 AM on 12/22/2015 -----------------------------------------   Blood pressure 158/92, pulse 95, temperature 98.3 F (36.8 C), temperature source Oral, resp. rate 20, height 6" (0.152 m), weight 230 lb (104.327 kg), SpO2 100 %.  The patient had no acute events since last update.  Calm and cooperative at this time.  Disposition is pending per Psychiatry/Behavioral Medicine team recommendations.     Irean Hong, MD 12/22/15 0700

## 2015-12-22 NOTE — Progress Notes (Signed)
Labetalol given for blood pressure of 177/121  Dr rice called

## 2015-12-22 NOTE — ED Provider Notes (Signed)
 -----------------------------------------   10:07 AM on 12/22/2015 -----------------------------------------  Patient remains medically stable with normal vital signs. Plan per psychiatry is to admit for inpatient behavioral health treatment and ECT.  Sharman Cheek, MD 12/22/15 [phone]

## 2015-12-22 NOTE — ED Notes (Signed)
Transport here for ect and [employer]

## 2015-12-22 NOTE — Procedures (Signed)
ECT SERVICES Physician's Interval Evaluation & Treatment Note  Patient Identification: Christian [name] MRN:  [id number] Date of Evaluation:  12/22/2015 TX #: [id number]  MADRS:   MMSE:   P.E. Findings:  Physically appears stable lungs and heart normal. Blood pressure in the normal range for him.  Psychiatric Interval Note:  Mood much worse. Had agitated episode over the weekend with aggression. Suicidal and homicidal ideation  Subjective:  Patient is a 65 y.o. male seen for evaluation for Electroconvulsive Therapy. Feeling very bad  Treatment Summary:   []   Right Unilateral             [x]  Bilateral   % Energy : 1.0 ms 100%   Impedance: 660 ohms  Seizure Energy Index: 3353 V squared  Postictal Suppression Index: 69%  Seizure Concordance Index: 98%  Medications  Pre Shock: Labetalol 20 mg ketamine 120 mg succinylcholine 150 mg  Post Shock:    Seizure Duration: 29 seconds by EKG, 42 seconds by EEG   Comments: Patient is being admitted to the hospital and will be scheduled for 3 times a week treatment with the next treatment scheduled for Wednesday, January 25   Lungs:  [x]   Clear to auscultation               []  Other:   Heart:    [x]   Regular rhythm             []  irregular rhythm    [x]   Previous H&P reviewed, patient examined and there are NO CHANGES                 []   Previous H&P reviewed, patient examined and there are changes noted.   [name], MD 1/23/201711:35 AM

## 2015-12-22 NOTE — Anesthesia Procedure Notes (Signed)
Date/Time: 12/22/2015 11:45 AM Performed by: Junious Silk Pre-anesthesia Checklist: Patient identified, Timeout performed, Emergency Drugs available, Suction available and Patient being monitored Oxygen Delivery Method: Circle system utilized and Simple face mask

## 2015-12-22 NOTE — Anesthesia Preprocedure Evaluation (Signed)
Anesthesia Evaluation  Patient identified by MRN, date of birth, ID band Patient awake    Reviewed: Allergy & Precautions, NPO status , Patient's Chart, lab work & pertinent test results  Airway Mallampati: IV  TM Distance: >3 FB Neck ROM: Limited    Dental  (+) Missing, Poor Dentition   Pulmonary shortness of breath and with exertion, sleep apnea and Continuous Positive Airway Pressure Ventilation ,    Pulmonary exam normal        Cardiovascular Exercise Tolerance: Poor hypertension, Pt. on medications + Peripheral Vascular Disease  Normal cardiovascular exam     Neuro/Psych    GI/Hepatic   Endo/Other  diabetes, Type 2BG 259 this morning.  Renal/GU      Musculoskeletal   Abdominal (+) + obese,   Peds  Hematology   Anesthesia Other Findings   Reproductive/Obstetrics                             Anesthesia Physical Anesthesia Plan  ASA: III  Anesthesia Plan: General   Post-op Pain Management:    Induction: Intravenous  Airway Management Planned: Mask  Additional Equipment:   Intra-op Plan:   Post-operative Plan:   Informed Consent: I have reviewed the patients History and Physical, chart, labs and discussed the procedure including the risks, benefits and alternatives for the proposed anesthesia with the patient or authorized representative who has indicated his/her understanding and acceptance.   Dental advisory given  Plan Discussed with: CRNA  Anesthesia Plan Comments:         Anesthesia Quick Evaluation

## 2015-12-22 NOTE — ED Notes (Signed)
ect will be done 1 hr ,will dc pt and call registration

## 2015-12-22 NOTE — Progress Notes (Signed)
Recreation Therapy Notes  Date: 01.23.17 Time: 3:00 pm Location: Craft [facility]  Group Topic: Self-expression  Goal Area(s) Addresses:  Patient will effectively use art as a means of self-expression. Patient will recognize positive benefit of self-expression. Patient will be able to identify one emotion experienced during group session. Patient will identify use of art as a coping skill.  Behavioral Response: Did not attend  Intervention: Two Faces of Me  Activity: Patients were given a blank face worksheet and instructed to draw or write on one half of the face how they were feeling when they were admitted and draw or write on the other half how they want to feel when they are d/c.  Education: LRT educated patients on different forms of self-expression.  Education Outcome: Patient did not attend group.  Clinical Observations/Feedback: Patient did not attend group.  Jacquelynn Cree, LRT/CTRS 12/22/2015 3:54 PM

## 2015-12-22 NOTE — ED Notes (Addendum)
Pt is aware he may go for ect , the ect unit  is notified, I am waitng for when to transport. Pt is to be adm

## 2015-12-22 NOTE — Progress Notes (Signed)
Pt admitted to unit after ECT treatment, Upon arrival to unit pt c/o dizziness, BP 152/93 P-84 sitting, BP 131/87 P-93 Standing, provided pt with wheelchair, instructed pt to not get up without assistance, pt verbalized understanding, MD notified of patients orthostatic VS, new order given to place pt on 1:1 with sitter, oriented pt to unit and rules, skin/contraband search done, skin intact, no contraband found.

## 2015-12-22 NOTE — Progress Notes (Signed)
labetalol 10 mg more given for blood pressure

## 2015-12-22 NOTE — ED Notes (Addendum)
Pt is to be adm from ect to [facility], the unit is aware ,i also called [facility]ts in [place] and notified them of adm

## 2015-12-22 NOTE — H&P (Signed)
Christian [name] is an 65 y.o. male.   Chief Complaint: Patient is more depressed. Having confusion. Suicidal and homicidal ideation with impulsive behavior. Presented to the emergency room and is needing to be readmitted to the hospital. HPI: Severe bipolar disorder with depression now with worsening behavior and semi-psychotic symptoms  Past Medical History  Diagnosis Date  . Mood swings ([place])   . Type II or unspecified type diabetes mellitus with unspecified complication, uncontrolled 06/02/2014  . Other and unspecified hyperlipidemia 06/02/2014  . OSA on CPAP 06/02/2014  . 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 Bipolar disorder ([place])   . Hypertension   . Gout   . Bipolar 1 disorder ([place])   . Hx of blood clots     History reviewed. No pertinent past surgical history.  Family History  Problem Relation Age of Onset  . Dementia Mother   . Arthritis Father   . Hypertension Father   . Heart failure Neg Hx   . Kidney failure Neg Hx   . Cancer Neg Hx   . Mental illness Sister   . Diabetes Sister    Social History:  reports that he has never smoked. He does not have any smokeless tobacco history on file. He reports that he does not drink alcohol or use illicit drugs.  Allergies: No Known Allergies   (Not in a hospital admission)  Results for orders placed or performed during the hospital encounter of 12/21/15 (from the past 48 hour(s))  Comprehensive metabolic panel     Status: Abnormal   Collection Time: 12/21/15 10:21 AM  Result Value Ref Range   Sodium 139 135 - 145 mmol/L   Potassium 4.3 3.5 - 5.1 mmol/L   Chloride 103 101 - 111 mmol/L   CO2 28 22 - 32 mmol/L   Glucose, Bld 274 (H) 65 - 99 mg/dL   BUN 24 (H) 6 - 20 mg/dL   Creatinine, Ser 1.21 0.61 - 1.24 mg/dL   Calcium 9.7 8.9 - 10.3 mg/dL   Total Protein 7.8 6.5 - 8.1 g/dL   Albumin 3.8 3.5 - 5.0 g/dL   AST 17 15 - 41 U/L   ALT 19 17 - 63 U/L   Alkaline Phosphatase 81 38 - 126  U/L   Total Bilirubin 0.9 0.3 - 1.2 mg/dL   GFR calc non Af Amer >60 >60 mL/min   GFR calc Af Amer >60 >60 mL/min    Comment: (NOTE) The eGFR has been calculated using the CKD EPI equation. This calculation has not been validated in all clinical situations. eGFR's persistently <60 mL/min signify possible Chronic Kidney Disease.    Anion gap 8 5 - 15  Ethanol (ETOH)     Status: None   Collection Time: 12/21/15 10:21 AM  Result Value Ref Range   Alcohol, Ethyl (B) <5 <5 mg/dL    Comment:        LOWEST DETECTABLE LIMIT FOR SERUM ALCOHOL IS 5 mg/dL FOR MEDICAL PURPOSES ONLY   Salicylate level     Status: None   Collection Time: 12/21/15 10:21 AM  Result Value Ref Range   Salicylate Lvl <9.8 2.8 - 30.0 mg/dL  Acetaminophen level     Status: Abnormal   Collection Time: 12/21/15 10:21 AM  Result Value Ref Range   Acetaminophen (Tylenol), Serum <10 (L) 10 - 30 ug/mL    Comment:        THERAPEUTIC CONCENTRATIONS VARY SIGNIFICANTLY. A RANGE OF 10-30 ug/mL  MAY BE AN EFFECTIVE CONCENTRATION FOR MANY PATIENTS. HOWEVER, SOME ARE BEST TREATED AT CONCENTRATIONS OUTSIDE THIS RANGE. ACETAMINOPHEN CONCENTRATIONS >150 ug/mL AT 4 HOURS AFTER INGESTION AND >50 ug/mL AT 12 HOURS AFTER INGESTION ARE OFTEN ASSOCIATED WITH TOXIC REACTIONS.   CBC     Status: None   Collection Time: 12/21/15 10:21 AM  Result Value Ref Range   WBC 4.4 3.8 - 10.6 K/uL   RBC 4.73 4.40 - 5.90 MIL/uL   Hemoglobin 13.6 13.0 - 18.0 g/dL   HCT 41.2 40.0 - 52.0 %   MCV 87.1 80.0 - 100.0 fL   MCH 28.8 26.0 - 34.0 pg   MCHC 33.1 32.0 - 36.0 g/dL   RDW 12.5 11.5 - 14.5 %   Platelets 184 150 - 440 K/uL  Protime-INR     Status: Abnormal   Collection Time: 12/21/15 10:21 AM  Result Value Ref Range   Prothrombin Time 15.3 (H) 11.4 - 15.0 seconds   INR 1.19   APTT     Status: None   Collection Time: 12/21/15 10:21 AM  Result Value Ref Range   aPTT 28 24 - 36 seconds  Glucose, capillary     Status: Abnormal    Collection Time: 12/21/15  7:49 PM  Result Value Ref Range   Glucose-Capillary 279 (H) 65 - 99 mg/dL  Glucose, capillary     Status: Abnormal   Collection Time: 12/22/15  8:02 AM  Result Value Ref Range   Glucose-Capillary 120 (H) 65 - 99 mg/dL   No results found.  Review of Systems  Constitutional: Negative.   HENT: Negative.   Eyes: Negative.   Respiratory: Negative.   Cardiovascular: Negative.   Gastrointestinal: Negative.   Musculoskeletal: Negative.   Skin: Negative.   Neurological: Negative.   Psychiatric/Behavioral: Positive for depression and suicidal ideas. Negative for hallucinations, memory loss and substance abuse. The patient is not nervous/anxious and does not have insomnia.     Blood pressure 145/95, pulse 98, temperature 98.4 F (36.9 C), temperature source Oral, resp. rate 18, height 6' (1.829 m), weight 109.317 kg (241 lb), SpO2 100 %. Physical Exam  Nursing note and vitals reviewed. Constitutional: He appears well-developed and well-nourished.  HENT:  Head: Normocephalic and atraumatic.  Eyes: Conjunctivae are normal. Pupils are equal, round, and reactive to light.  Neck: Normal range of motion.  Cardiovascular: Normal rate, regular rhythm and normal heart sounds.   Respiratory: Effort normal and breath sounds normal. No respiratory distress.  GI: Soft.  Musculoskeletal: Normal range of motion.  Neurological: He is alert.  Skin: Skin is warm and dry.  Psychiatric: His speech is delayed. He is slowed. Cognition and memory are impaired. He expresses impulsivity. He exhibits a depressed mood. He expresses homicidal and suicidal ideation.     Assessment/Plan ECT today along with admission to the hospital  [place] 12/22/2015, 11:33 AM
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
## 2015-12-23 LAB — GLUCOSE, CAPILLARY: Glucose-Capillary: 151 mg/dL — ABNORMAL HIGH (ref 65–99)

## 2015-12-23 NOTE — Tx Team (Signed)
Interdisciplinary Treatment Plan Update (Adult)  Date:  12/23/2015 Time Reviewed:  4:35 PM  Progress in Treatment: Attending groups: Yes. Participating in groups:  Yes. Taking medication as prescribed:  Yes. Tolerating medication:  Yes. Family/Significant other contact made:  No, will contact:    Patient understands diagnosis:  Yes. Discussing patient identified problems/goals with staff:  Yes. Medical problems stabilized or resolved:  Yes. Denies suicidal/homicidal ideation: No. Issues/concerns per patient self-inventory:  No. Other:  New problem(s) identified: No, Describe:     Discharge Plan or Barriers:  Patient will be discharged back home and follow up with Dr. Weber Cooks for outpatient ECT.  Reason for Continuation of Hospitalization: Depression Suicidal ideation  Comments:  Estimated length of stay:up to 5 days  New goal(s):  Review of initial/current patient goals per problem list:   1.  Goal(s):Pt will participate in aftercare plan.  Met:  No  Target date:at discharge  As evidenced by:Aftercare provider and housing plan at discharge being identified.  2.  Goal (s):Pt will exhibit decreased depressive symptoms and suicidal ideation.  Met:  No  Target date:at discharge  As evidenced by:Pt will utilize self rating of depression at 3 or below and demonstrate decreased signs of depression OR be deemed stable for discharge by MD.   Attendees: Patient:  Christian [name] 1/24/20174:35 PM  Family:   1/24/20174:35 PM  Physician:  Dr. Weber Cooks 1/24/20174:35 PM  Nursing:    1/24/20174:35 PM  Case Manager:   1/24/20174:35 PM  Counselor:  Dossie Arbour, LCSW 1/24/20174:35 PM  Other:   1/24/20174:35 PM  Other:   1/24/20174:35 PM  Other:   1/24/20174:35 PM  Other:  1/24/20174:35 PM  Other:  1/24/20174:35 PM  Other:  1/24/20174:35 PM  Other:  1/24/20174:35 PM  Other:  1/24/20174:35 PM  Other:  1/24/20174:35 PM  Other:   1/24/20174:35 PM   Scribe for Treatment Team:    August Saucer, 12/23/2015, 4:35 PM, MSW, LCSW

## 2015-12-23 NOTE — Plan of Care (Signed)
Problem: Ineffective individual coping Goal: STG: Patient will remain free from self harm Outcome: Progressing Patient remains free from harm at this time

## 2015-12-23 NOTE — Progress Notes (Signed)
D: Pt remains in his room this evening. Denies SI/HI at this time, although he states "I was having thoughts of hurting myself and my wife." Pt denies AVH at this time. Denies pain. Pt is unsteady on his feet and has a walker in his room. A: Emotional support and encouragement provided. Pt instructed not to get up without assistance. Medications administered as prescribed with education. q15 minute safety checks maintained. R: Pt verbalizes understanding. Pt remains on 1:1 with sitter for safety. Pt remains free from harm.

## 2015-12-23 NOTE — [facility] Group Notes (Signed)
[facility] Group Notes:  (Nursing/MHT/Case Management/Adjunct)  Date:  12/23/2015  Time:  1:39 PM  Type of Therapy:  Psychoeducational Skills  Participation Level:  Active  Participation Quality:  Appropriate and Attentive  Affect:  Appropriate  Cognitive:  Alert and Appropriate  Insight:  Appropriate and Good  Engagement in Group:  Engaged  Modes of Intervention:  Discussion, Education and Support  Summary of Progress/Problems:  Darrow Bussing 12/23/2015, 1:39 PM

## 2015-12-23 NOTE — Progress Notes (Signed)
Pt taken off of 1:1. Pt is thoroughly educated on how to prevent falls and encouraged to call staff before getting up on his own. Pt verbalizes understanding of teaching. Pt remains free from harm.

## 2015-12-23 NOTE — Plan of Care (Signed)
Problem: Ineffective individual coping Goal: LTG: Patient will report a decrease in negative feelings Outcome: Progressing Patient reported feeling better today than yesterday

## 2015-12-23 NOTE — Plan of Care (Signed)
Problem: Alteration in mood Goal: LTG-Patient reports reduction in suicidal thoughts (Patient reports reduction in suicidal thoughts and is able to verbalize a safety plan for whenever patient is feeling suicidal)  Outcome: Progressing Pt denies SI at this time     
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
## 2015-12-23 NOTE — Progress Notes (Signed)
D:  Patient is alert and oriented on the unit this shift.  Patient attends and minimally participates in groups today.  Patient denies suicidal ideation, homicidal ideation, auditory or visual hallucinations currently. A:  Scheduled medications were administered to patient as per MD orders.  Emotional support and encouragement were provided.  Patient maintained on q.15 minute safety checks.  Patient informed to notify staff with any questions or concerns. R:  No adverse medication reactions were noted.  Patient is cooperative with medication administration and treatment plan this shift.  Patient was receptive, calm and cooperative at this time.  Patient contracts for safety on the unit.  Patient remains safe at this time.

## 2015-12-23 NOTE — [facility] Group Notes (Signed)
[facility] LCSW Group Therapy  12/23/2015 4:49 PM  Type of Therapy:  Group Therapy  Participation Level:  Active  Participation Quality:  Appropriate and Attentive  Affect:  Blunted, Depressed and Flat  Cognitive:  Appropriate  Insight:  Developing/Improving  Engagement in Therapy:  Engaged  Modes of Intervention:  Discussion, Education, Exploration, Problem-solving and Socialization  Summary of Progress/Problems:Pt attended and participated in group discussion around unhelpful thinking styles.  Utilize Power thought cards to demonstrate and Holiday representative. Pt verbalizes understanding of what was covered, sharing he could relate to unhelpful thinking as he often feels lonely and is not supported by his family the way he would like.  Glennon Mac, MSW, LCSW 12/23/2015, 4:49 PM

## 2015-12-23 NOTE — Progress Notes (Signed)
Recreation Therapy Notes  Date: 01.24.17 Time: 3:00 pm Location: Craft Room  Group Topic: Goal Setting  Goal Area(s) Addresses:  Patient will write at least one goal. Patient will write at least one obstacle.  Behavioral Response: Attentive, Interactive  Intervention: Recovery Goal Chart  Activity: Patients were instructed to make a Recovery Goal Chart including goals, obstacles, the date they started working on their goals, and the date they achieved their goal.  Education: LRT educated patients on healthy ways they can celebrate reaching their goals.  Education Outcome: In group clarification offered   Clinical Observations/Feedback: Patient completed activity by writing goals, obstacles, and the date he started working on his goals. Patient contributed to group discussion by stating how he can overcome his obstacles.  Jacquelynn Cree, LRT/CTRS 12/23/2015 4:28 PM

## 2015-12-24 ENCOUNTER — Inpatient Hospital Stay: Payer: Commercial Managed Care - HMO | Admitting: Anesthesiology

## 2015-12-24 ENCOUNTER — Inpatient Hospital Stay: Payer: Commercial Managed Care - HMO

## 2015-12-24 ENCOUNTER — Encounter: Payer: Self-pay | Admitting: *Deleted

## 2015-12-24 DIAGNOSIS — F319 Bipolar disorder, unspecified: Secondary | ICD-10-CM | POA: Diagnosis not present

## 2015-12-24 DIAGNOSIS — E119 Type 2 diabetes mellitus without complications: Secondary | ICD-10-CM | POA: Diagnosis not present

## 2015-12-24 DIAGNOSIS — E785 Hyperlipidemia, unspecified: Secondary | ICD-10-CM | POA: Diagnosis not present

## 2015-12-24 DIAGNOSIS — R45851 Suicidal ideations: Secondary | ICD-10-CM | POA: Diagnosis not present

## 2015-12-24 DIAGNOSIS — R600 Localized edema: Secondary | ICD-10-CM | POA: Diagnosis not present

## 2015-12-24 DIAGNOSIS — F329 Major depressive disorder, single episode, unspecified: Secondary | ICD-10-CM | POA: Diagnosis not present

## 2015-12-24 DIAGNOSIS — R45850 Homicidal ideations: Secondary | ICD-10-CM | POA: Diagnosis not present

## 2015-12-24 DIAGNOSIS — I10 Essential (primary) hypertension: Secondary | ICD-10-CM | POA: Diagnosis not present

## 2015-12-24 DIAGNOSIS — G4733 Obstructive sleep apnea (adult) (pediatric): Secondary | ICD-10-CM | POA: Diagnosis not present

## 2015-12-24 MED ORDER — LABETALOL HCL 5 MG/ML IV SOLN
20.0000 mg | Freq: Once | INTRAVENOUS | Status: AC
  Administered 2015-12-31 (×2): 20 mg via INTRAVENOUS

## 2015-12-24 MED ORDER — KETAMINE HCL 10 MG/ML IJ SOLN
INTRAMUSCULAR | Status: DC | PRN
  Administered 2015-12-24: 120 mg via INTRAVENOUS

## 2015-12-24 MED ORDER — SODIUM CHLORIDE 0.9 % IV SOLN
INTRAVENOUS | Status: DC | PRN
  Administered 2015-12-24: 12:00:00 via INTRAVENOUS

## 2015-12-24 MED ORDER — SUCCINYLCHOLINE 20MG/ML (10ML) SYRINGE FOR MEDFUSION PUMP - OPTIME
INTRAMUSCULAR | Status: DC | PRN
  Administered 2015-12-24: 150 mg via INTRAVENOUS

## 2015-12-24 MED ORDER — LISINOPRIL 20 MG PO TABS
40.0000 mg | ORAL_TABLET | Freq: Every day | ORAL | Status: DC
  Administered 2015-12-24 – 2016-01-17 (×24): 40 mg via ORAL
  Filled 2015-12-24 (×29): qty 2

## 2015-12-24 MED ORDER — LABETALOL HCL 5 MG/ML IV SOLN
INTRAVENOUS | Status: DC | PRN
  Administered 2015-12-24 (×2): 20 mg via INTRAVENOUS

## 2015-12-24 MED ORDER — HYDRALAZINE HCL 20 MG/ML IJ SOLN
10.0000 mg | Freq: Once | INTRAMUSCULAR | Status: AC
  Administered 2015-12-24: 10 mg via INTRAVENOUS
  Filled 2015-12-24: qty 0.5

## 2015-12-24 MED ORDER — SODIUM CHLORIDE 0.9 % IV SOLN
250.0000 mL | Freq: Once | INTRAVENOUS | Status: AC
  Administered 2015-12-24: 500 mL via INTRAVENOUS

## 2015-12-24 NOTE — H&P (Signed)
Christian [name] is an 65 y.o. male.   Chief Complaint: Continued depression as part of bipolar disorder HPI: Receiving ECT inpatient  Past Medical History  Diagnosis Date  . Mood swings (HCC)   . Type II or unspecified type diabetes mellitus with unspecified complication, uncontrolled 06/02/2014  . Other and unspecified hyperlipidemia 06/02/2014  . OSA on CPAP 06/02/2014  . 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 Bipolar disorder (HCC)   . Hypertension   . Gout   . Bipolar 1 disorder (HCC)   . Hx of blood clots     History reviewed. No pertinent past surgical history.  Family History  Problem Relation Age of Onset  . Dementia Mother   . Arthritis Father   . Hypertension Father   . Heart failure Neg Hx   . Kidney failure Neg Hx   . Cancer Neg Hx   . Mental illness Sister   . Diabetes Sister    Social History:  reports that he has never smoked. He does not have any smokeless tobacco history on file. He reports that he does not drink alcohol or use illicit drugs.  Allergies: No Known Allergies  Medications Prior to Admission  Medication Sig Dispense Refill  . allopurinol (ZYLOPRIM) 300 MG tablet Take 1 tablet (300 mg total) by mouth daily. 30 tablet 0  . apixaban (ELIQUIS) 5 MG TABS tablet Take 1 tablet (5 mg total) by mouth 2 (two) times daily. 60 tablet 0  . aspirin EC 81 MG EC tablet Take 1 tablet (81 mg total) by mouth daily. 30 tablet 0  . atorvastatin (LIPITOR) 10 MG tablet Take 1 tablet (10 mg total) by mouth daily at 6 PM. 30 tablet 0  . clonazePAM (KLONOPIN) 1 MG tablet Take 1 tablet (1 mg total) by mouth at bedtime. 30 tablet 0  . docusate sodium (COLACE) 100 MG capsule Take 2 capsules (200 mg total) by mouth 2 (two) times daily. 60 capsule 0  . finasteride (PROSCAR) 5 MG tablet Take 1 tablet (5 mg total) by mouth daily. 30 tablet 0  . furosemide (LASIX) 20 MG tablet Take 20 mg by mouth.    Marland Kitchen lisinopril (PRINIVIL,ZESTRIL) 40 MG  tablet Take 40 mg by mouth daily.    . metFORMIN (GLUCOPHAGE) 500 MG tablet Take 500 mg by mouth daily. 2 tablets    . mirtazapine (REMERON) 45 MG tablet Take 1 tablet (45 mg total) by mouth at bedtime. 30 tablet 0  . OLANZapine (ZYPREXA) 15 MG tablet Take 2 tablets (30 mg total) by mouth at bedtime. 60 tablet 0  . polyethylene glycol (MIRALAX / GLYCOLAX) packet Take 17 g by mouth daily. 30 each 0  . tamsulosin (FLOMAX) 0.4 MG CAPS capsule Take 1 capsule (0.4 mg total) by mouth daily after supper. 30 capsule 0    Results for orders placed or performed during the hospital encounter of 12/22/15 (from the past 48 hour(s))  Glucose, capillary     Status: Abnormal   Collection Time: 12/22/15  5:20 PM  Result Value Ref Range   Glucose-Capillary 179 (H) 65 - 99 mg/dL   Comment 1 Notify RN   Glucose, capillary     Status: Abnormal   Collection Time: 12/22/15  8:32 PM  Result Value Ref Range   Glucose-Capillary 151 (H) 65 - 99 mg/dL   Comment 1 Notify RN    Comment 2 Document in Chart    No results found.  Review of  Systems  Constitutional: Negative.   HENT: Negative.   Eyes: Negative.   Respiratory: Negative.   Cardiovascular: Negative.   Gastrointestinal: Negative.   Musculoskeletal: Negative.   Skin: Negative.   Neurological: Negative.   Psychiatric/Behavioral: Positive for depression and memory loss. Negative for suicidal ideas, hallucinations and substance abuse. The patient is nervous/anxious and has insomnia.     Blood pressure 147/87, pulse 87, temperature 97.3 F (36.3 C), temperature source Oral, resp. rate 20, height 6' (1.829 m), weight 104.327 kg (230 lb), SpO2 100 %. Physical Exam  Nursing note and vitals reviewed. Constitutional: He appears well-developed and well-nourished.  HENT:  Head: Normocephalic and atraumatic.  Eyes: Conjunctivae are normal. Pupils are equal, round, and reactive to light.  Neck: Normal range of motion.  Cardiovascular: Regular rhythm and  normal heart sounds.   Respiratory: Effort normal and breath sounds normal.  GI: Soft.  Musculoskeletal: Normal range of motion.  Neurological: He is alert.  Skin: Skin is warm and dry.     Assessment/Plan Continue 3 times a week ECT see full notes  Jamita Mckelvin 12/24/2015, 12:13 PM

## 2015-12-24 NOTE — [facility] Group Notes (Signed)
[facility] LCSW Aftercare Discharge Planning Group Note   12/24/2015 10:55 AM  Participation Quality:  Did not attend   Nikita Humble L [place] MSW, LCSWA

## 2015-12-24 NOTE — Plan of Care (Signed)
Problem: Ineffective individual coping Goal: LTG: Patient will report a decrease in negative feelings Outcome: Progressing Patient reported feeling slightly better today than yesterday

## 2015-12-24 NOTE — [facility] Group Notes (Signed)
[facility] LCSW Group Therapy  12/24/2015 2:28 PM  Type of Therapy: Group Therapy  Participation Level: Did Not Attend  Modes of Intervention: Discussion, Education, Exploration, Socialization and Support  Summary of Progress/Problems: Self esteem: Patients discussed self esteem and how it impacts them. They discussed what aspects in their lives has influenced their self esteem. They were challenged to identify changes that are needed in order to improve self esteem.   [employer] MSW, LCSWA  12/24/2015, 2:28 PM

## 2015-12-24 NOTE — Plan of Care (Signed)
Problem: Ineffective individual coping Goal: STG: Patient will remain free from self harm Outcome: Progressing Patient remains free from self harm on the unit this shift     
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
## 2015-12-24 NOTE — Procedures (Signed)
ECT SERVICES Physician's Interval Evaluation & Treatment Note  Patient Identification: TAVEON STEINWAND MRN:  [id number] Date of Evaluation:  12/24/2015 TX #: [id number]  MADRS:   MMSE:   P.E. Findings:  No new physical complaints or changes  Psychiatric Interval Note:  Mood continues to be depressed  Subjective:  Patient is a 65 y.o. male seen for evaluation for Electroconvulsive Therapy. Patient has no new specific complaints.  Treatment Summary:   []   Right Unilateral             [x]  Bilateral   % Energy : 1.0 ms 100%   Impedance: 460 ohms  Seizure Energy Index: 1453 V squared  Postictal Suppression Index: 49%  Seizure Concordance Index: 98%  Medications  Pre Shock: Labetalol 20 mg ketamine 120 mg succinylcholine 150 mg  Post Shock: Labetalol 20 mg  Seizure Duration: 41 seconds by EMG 72 seconds by EEG   Comments: Patient will follow-up with next treatment on Friday   Lungs:  [x]   Clear to auscultation               []  Other:   Heart:    [x]   Regular rhythm             []  irregular rhythm    [x]   Previous H&P reviewed, patient examined and there are NO CHANGES                 []   Previous H&P reviewed, patient examined and there are changes noted.   [name], MD 1/25/201712:15 PM

## 2015-12-24 NOTE — Anesthesia Procedure Notes (Signed)
Date/Time: 12/24/2015 12:22 PM Performed by: Lily Kocher Pre-anesthesia Checklist: Patient identified, Emergency Drugs available, Suction available and Patient being monitored Patient Re-evaluated:Patient Re-evaluated prior to inductionOxygen Delivery Method: Circle system utilized Preoxygenation: Pre-oxygenation with 100% oxygen Intubation Type: IV induction Ventilation: Mask ventilation without difficulty and Mask ventilation throughout procedure Airway Equipment and Method: Bite block Placement Confirmation: positive ETCO2 Dental Injury: Teeth and Oropharynx as per pre-operative assessment

## 2015-12-24 NOTE — Progress Notes (Signed)
D:  Patient is alert and oriented on the unit this shift.  Patient denies suicidal ideation, homicidal ideation, auditory or visual hallucinations at the present time.  Patient was off the unit this am for ECT treatment. A:  Scheduled medications are administered to patient as per MD orders.  Emotional support and encouragement are provided.  Patient is maintained on q.15 minute safety checks.  Patient is informed to notify staff with questions or concerns. R:  No adverse medication reactions are noted.  Patient is cooperative with medication administration and treatment plan today.  Patient is receptive, calm and cooperative on the unit at this time.  Patient interacts well with others on the unit this shift.  Patient contracts for safety at this time.  Patient remains safe at this time.

## 2015-12-24 NOTE — [facility] Group Notes (Signed)
[facility] Group Notes:  (Nursing/MHT/Case Management/Adjunct)  Date:  12/24/2015  Time:  12:37 PM  Type of Therapy:  Group Therapy  Participation Level:  Did Not Attend  Participation Qual Summary of Progress/Problems:  Christian [name] 12/24/2015, 12:37 PM

## 2015-12-24 NOTE — Anesthesia Preprocedure Evaluation (Signed)
Anesthesia Evaluation  Patient identified by MRN, date of birth, ID band Patient awake    Reviewed: Allergy & Precautions, NPO status , Patient's Chart, lab work & pertinent test results  History of Anesthesia Complications Negative for: history of anesthetic complications  Airway Mallampati: IV  TM Distance: >3 FB Neck ROM: Limited    Dental  (+) Missing, Poor Dentition   Pulmonary shortness of breath and with exertion, sleep apnea and Continuous Positive Airway Pressure Ventilation ,    Pulmonary exam normal        Cardiovascular Exercise Tolerance: Poor hypertension, Pt. on medications + Peripheral Vascular Disease  Normal cardiovascular exam     Neuro/Psych PSYCHIATRIC DISORDERS    GI/Hepatic   Endo/Other  diabetes, Type 2BG 259 this morning.  Renal/GU      Musculoskeletal   Abdominal (+) + obese,   Peds  Hematology   Anesthesia Other Findings   Reproductive/Obstetrics                             Anesthesia Physical  Anesthesia Plan  ASA: III  Anesthesia Plan: General   Post-op Pain Management:    Induction: Intravenous  Airway Management Planned: Mask  Additional Equipment:   Intra-op Plan:   Post-operative Plan:   Informed Consent: I have reviewed the patients History and Physical, chart, labs and discussed the procedure including the risks, benefits and alternatives for the proposed anesthesia with the patient or authorized representative who has indicated his/her understanding and acceptance.   Dental advisory given  Plan Discussed with: CRNA, Anesthesiologist and Surgeon  Anesthesia Plan Comments:         Anesthesia Quick Evaluation

## 2015-12-24 NOTE — Transfer of Care (Signed)
Immediate Anesthesia Transfer of Care Note  Patient: Christian [name]  Procedure(s) Performed: ECT  Patient Location: PACU  Anesthesia Type:General  Level of Consciousness: sedated  Airway & Oxygen Therapy: Patient Spontanous Breathing and Patient connected to face mask oxygen  Post-op Assessment: Report given to RN  Post vital signs: Reviewed and stable; Dr. Donell Sievert aware of increased BP; will give Hydralzine 10 mg IV and reassess   Last Vitals:  Filed Vitals:   12/24/15 0908 12/24/15 1235  BP: 147/87 179/126  Pulse: 87 78  Temp: 36.3 C 37.2 C  Resp: 20 14    Complications: No apparent anesthesia complications
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
## 2015-12-24 NOTE — Plan of Care (Signed)
Problem: Alteration in mood Goal: LTG-Patient reports reduction in suicidal thoughts (Patient reports reduction in suicidal thoughts and is able to verbalize a safety plan for whenever patient is feeling suicidal)  Outcome: Progressing Pt continues to deny SI.

## 2015-12-25 DIAGNOSIS — R45851 Suicidal ideations: Secondary | ICD-10-CM | POA: Diagnosis not present

## 2015-12-25 DIAGNOSIS — N179 Acute kidney failure, unspecified: Secondary | ICD-10-CM | POA: Diagnosis not present

## 2015-12-25 DIAGNOSIS — F315 Bipolar disorder, current episode depressed, severe, with psychotic features: Secondary | ICD-10-CM | POA: Diagnosis not present

## 2015-12-25 DIAGNOSIS — G4733 Obstructive sleep apnea (adult) (pediatric): Secondary | ICD-10-CM | POA: Diagnosis not present

## 2015-12-25 DIAGNOSIS — E1122 Type 2 diabetes mellitus with diabetic chronic kidney disease: Secondary | ICD-10-CM | POA: Diagnosis not present

## 2015-12-25 DIAGNOSIS — M109 Gout, unspecified: Secondary | ICD-10-CM | POA: Diagnosis not present

## 2015-12-25 DIAGNOSIS — N183 Chronic kidney disease, stage 3 (moderate): Secondary | ICD-10-CM | POA: Diagnosis not present

## 2015-12-25 DIAGNOSIS — Z86718 Personal history of other venous thrombosis and embolism: Secondary | ICD-10-CM | POA: Diagnosis not present

## 2015-12-25 DIAGNOSIS — Z86711 Personal history of pulmonary embolism: Secondary | ICD-10-CM | POA: Diagnosis not present

## 2015-12-25 LAB — CBC
HCT: 43.7 % (ref 40.0–52.0)
Hemoglobin: 14.5 g/dL (ref 13.0–18.0)
MCH: 29.6 pg (ref 26.0–34.0)
MCHC: 33.3 g/dL (ref 32.0–36.0)
MCV: 89 fL (ref 80.0–100.0)
Platelets: 219 10*3/uL (ref 150–440)
RBC: 4.91 MIL/uL (ref 4.40–5.90)
RDW: 12.9 % (ref 11.5–14.5)
WBC: 4.6 10*3/uL (ref 3.8–10.6)

## 2015-12-25 LAB — CREATININE, SERUM
Creatinine, Ser: 1.14 mg/dL (ref 0.61–1.24)
GFR calc Af Amer: >60 mL/min (ref >60)
GFR calc non Af Amer: >60 mL/min (ref >60)

## 2015-12-25 MED ORDER — APIXABAN 5 MG PO TABS
5.0000 mg | ORAL_TABLET | Freq: Two times a day (BID) | ORAL | Status: DC
  Administered 2015-12-25 – 2016-02-03 (×80): 5 mg via ORAL
  Filled 2015-12-25 (×80): qty 1

## 2015-12-25 MED ORDER — HYDRALAZINE HCL 10 MG PO TABS
10.0000 mg | ORAL_TABLET | Freq: Once | ORAL | Status: AC
  Administered 2015-12-26: 10 mg via ORAL
  Filled 2015-12-25: qty 1

## 2015-12-25 NOTE — [facility] Group Notes (Signed)
[facility] Group Notes:  (Nursing/MHT/Case Management/Adjunct)  Date:  12/25/2015  Time:  8:50 AM  Type of Therapy:  Community Group   Participation Level:  Did Not Attend  Christian [name] 12/25/2015, 8:50 AM

## 2015-12-25 NOTE — Progress Notes (Signed)
Mostly in bed, able to talk about his 63 year old daughter, sad, depressed. Denies SI/SIB, no chest pain, no SOB, NAD

## 2015-12-25 NOTE — Progress Notes (Signed)
Recreation Therapy Notes  Date: 01.26.17 Time: 3:00 pm Location: Craft Room  Group Topic: Leisure Education  Goal Area(s) Addresses:  Patient will identify activities for each letter of the alphabet. Patient will verbalize ability to integrate positive leisure into life post d/c. Patient will verbalize ability to use leisure as a Associate Professor.  Behavioral Response: Did not attend  Intervention: Leisure Alphabet  Activity: Patients were given a Leisure Information systems manager and instructed to list healthy leisure activities for each letter of the alphabet bet.  Education: LRT educated patients on what they need to participate in leisure.  Education Outcome: Patient did not attend group.   Clinical Observations/Feedback: Patient did not attend group.  Jacquelynn Cree, LRT/CTRS 12/25/2015 4:15 PM

## 2015-12-25 NOTE — Progress Notes (Signed)
Pt presents with sad, flat affect. Minimal conversation offered but will answer questions. Pt needs direction of where to go on unit. Eating meals well. Reports he slept poorly last night and describes energy level as low. Endorses depression at 5, hopelessness at 7 and anxiety at 5. States he has been feeling dizzy at times. Could not verbalize where pain is and did not want pain medication. Encouragement and support offered. Encouraged pt to attend group, pt did not want to. Will continue to assess and monitor for safety.

## 2015-12-25 NOTE — [facility] Group Notes (Signed)
[facility] Group Notes:  (Nursing/MHT/Case Management/Adjunct)  Date:  12/25/2015  Time:  2:03 PM  Type of Therapy:  Psychoeducational Skills  Participation Level:  Did Not Attend  Marquette Old 12/25/2015, 2:03 PM

## 2015-12-25 NOTE — Plan of Care (Signed)
Problem: Ineffective individual coping Goal: STG: Patient will remain free from self harm Outcome: Progressing Medications administered as ordered by the physician, medications Therapeutic Effects, SEs and Adverse effects discussed, questions encouraged; no PRN given, 1:1 Arms Length maintained for safety, clinical and moral support provided, patient encouraged to continue to express feelings and demonstrate safe care. Patient remain free from harm, will continue to monitor.

## 2015-12-25 NOTE — Consult Note (Signed)
[facility] - [place] at [facility]   PATIENT NAME: Christian [name]    MR#:  [id number]  DATE OF BIRTH:  1951-07-15  DATE OF ADMISSION:  12/22/2015  PRIMARY CARE PHYSICIAN: Leanor Rubenstein, MD   CONSULT REQUESTING/REFERRING PHYSICIAN: Dr. Toni Amend  REASON FOR CONSULT: DVT/PE and Eliquis dosing and duration  CHIEF COMPLAINT:  No chief complaint on file.   HISTORY OF PRESENT ILLNESS:  Christian [name]  is a 65 y.o. male with a known history of depression, DVT/PE in oct 2016 admitted to [facility]. Patient has been taking eliquis once a day 5 mg. He has not had any CP/SOB/LE swelling. No bleeding. Renal function normal.  PAST MEDICAL HISTORY:   Past Medical History  Diagnosis Date  . Mood swings (HCC)   . Type II or unspecified type diabetes mellitus with unspecified complication, uncontrolled 06/02/2014  . Other and unspecified hyperlipidemia 06/02/2014  . OSA on CPAP 06/02/2014  . 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 Bipolar disorder (HCC)   . Hypertension   . Gout   . Bipolar 1 disorder (HCC)   . Hx of blood clots     PAST SURGICAL HISTOIRY:  History reviewed. No pertinent past surgical history.  SOCIAL HISTORY:   Social History  Substance Use Topics  . Smoking status: Never Smoker   . Smokeless tobacco: Not on file  . Alcohol Use: No    FAMILY HISTORY:   Family History  Problem Relation Age of Onset  . Dementia Mother   . Arthritis Father   . Hypertension Father   . Heart failure Neg Hx   . Kidney failure Neg Hx   . Cancer Neg Hx   . Mental illness Sister   . Diabetes Sister     DRUG ALLERGIES:  No Known Allergies  REVIEW OF SYSTEMS:   ROS  CONSTITUTIONAL: No fever, fatigue or weakness.  EYES: No blurred or double vision.  EARS, NOSE, AND THROAT: No tinnitus or ear pain.  RESPIRATORY: No cough, shortness of breath, wheezing or hemoptysis.  CARDIOVASCULAR: No chest pain, orthopnea, edema.   GASTROINTESTINAL: No nausea, vomiting, diarrhea or abdominal pain.  GENITOURINARY: No dysuria, hematuria.  ENDOCRINE: No polyuria, nocturia,  HEMATOLOGY: No anemia, easy bruising or bleeding SKIN: No rash or lesion. MUSCULOSKELETAL: No joint pain or arthritis.   NEUROLOGIC: No tingling, numbness, weakness.  PSYCHIATRY: Depression.   MEDICATIONS AT HOME:   Prior to Admission medications   Medication Sig Start Date End Date Taking? Authorizing Provider  allopurinol (ZYLOPRIM) 300 MG tablet Take 1 tablet (300 mg total) by mouth daily. 09/18/15   Audery Amel, MD  apixaban (ELIQUIS) 5 MG TABS tablet Take 1 tablet (5 mg total) by mouth 2 (two) times daily. 09/18/15   Audery Amel, MD  aspirin EC 81 MG EC tablet Take 1 tablet (81 mg total) by mouth daily. 09/18/15   Audery Amel, MD  atorvastatin (LIPITOR) 10 MG tablet Take 1 tablet (10 mg total) by mouth daily at 6 PM. 09/18/15   Audery Amel, MD  clonazePAM (KLONOPIN) 1 MG tablet Take 1 tablet (1 mg total) by mouth at bedtime. 09/18/15   Audery Amel, MD  docusate sodium (COLACE) 100 MG capsule Take 2 capsules (200 mg total) by mouth 2 (two) times daily. 09/18/15   Audery Amel, MD  finasteride (PROSCAR) 5 MG tablet Take 1 tablet (5 mg total) by mouth daily. 09/18/15   Audery Amel,  MD  furosemide (LASIX) 20 MG tablet Take 20 mg by mouth.    Historical Provider, MD  lisinopril (PRINIVIL,ZESTRIL) 40 MG tablet Take 40 mg by mouth daily.    Historical Provider, MD  metFORMIN (GLUCOPHAGE) 500 MG tablet Take 500 mg by mouth daily. 2 tablets    Historical Provider, MD  mirtazapine (REMERON) 45 MG tablet Take 1 tablet (45 mg total) by mouth at bedtime. 09/18/15   Audery Amel, MD  OLANZapine (ZYPREXA) 15 MG tablet Take 2 tablets (30 mg total) by mouth at bedtime. 09/18/15   Audery Amel, MD  polyethylene glycol (MIRALAX / GLYCOLAX) packet Take 17 g by mouth daily. 09/18/15   Audery Amel, MD  tamsulosin (FLOMAX) 0.4 MG CAPS  capsule Take 1 capsule (0.4 mg total) by mouth daily after supper. 09/18/15   Audery Amel, MD      VITAL SIGNS:  Blood pressure 119/68, pulse 101, temperature 99 F (37.2 C), temperature source Oral, resp. rate 20, height 6' (1.829 m), weight 104.327 kg (230 lb), SpO2 98 %.  PHYSICAL EXAMINATION:  GENERAL:  65 y.o.-year-old patient lying in the bed with no acute distress.  EYES: Pupils equal, round, reactive to light and accommodation. No scleral icterus. Extraocular muscles intact.  HEENT: Head atraumatic, normocephalic. Oropharynx and nasopharynx clear.  NECK:  Supple, no jugular venous distention. No thyroid enlargement, no tenderness.  LUNGS: Normal breath sounds bilaterally, no wheezing, rales,rhonchi or crepitation. No use of accessory muscles of respiration.  CARDIOVASCULAR: S1, S2 normal. No murmurs, rubs, or gallops.  ABDOMEN: Soft, nontender, nondistended. Bowel sounds present. No organomegaly or mass.  EXTREMITIES: No pedal edema, cyanosis, or clubbing.  NEUROLOGIC: Cranial nerves II through XII are intact. Muscle strength 5/5 in all extremities. Sensation intact. Gait not checked.  PSYCHIATRIC: The patient is alert and awake. Flat affect SKIN: No obvious rash, lesion, or ulcer.   LABORATORY PANEL:   CBC  Recent Labs Lab 12/25/15 0708  WBC 4.6  HGB 14.5  HCT 43.7  PLT 219   ------------------------------------------------------------------------------------------------------------------  Chemistries   Recent Labs Lab 12/21/15 1021 12/25/15 0708  NA 139  --   K 4.3  --   CL 103  --   CO2 28  --   GLUCOSE 274*  --   BUN 24*  --   CREATININE 1.21 1.14  CALCIUM 9.7  --   AST 17  --   ALT 19  --   ALKPHOS 81  --   BILITOT 0.9  --    ------------------------------------------------------------------------------------------------------------------  Cardiac Enzymes No results for input(s): TROPONINI in the last 168  hours. ------------------------------------------------------------------------------------------------------------------  RADIOLOGY:  No results found.  EKG:   Orders placed or performed during the hospital encounter of 08/27/15  . EKG 12-Lead  . 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 Needs to see hematology-Oncology as OP for decision to decide on further treatment after that. NO CP/SOB Needs Eliquis 5 mg BID Discussed with Dr. Toni Amend  * Depression Per Dr. Toni Amend  All the records are reviewed and case discussed with Consulting provider. Management plans discussed with the patient, family and they are in agreement.  CODE STATUS: FULL  TOTAL TIME TAKING CARE OF THIS PATIENT: 40 minutes.    Milagros Loll R M.D on 12/25/2015 at 12:34 PM  Between 7am to 6pm - Pager - [phone]  After 6pm go to [email or web address] -  password [email or web address] [facility]  [facility]  Office  [phone]  CC: Primary care Physician: Leanor Rubenstein, MD   Note: This dictation was prepared with Dragon dictation along with smaller phrase technology. Any transcriptional errors that result from this process are unintentional.

## 2015-12-25 NOTE — Progress Notes (Signed)
Called hospitalist in ref to consult order. States they do not monitor patients on Eloquis. Paged Dr Toni Amend to contact dr.
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
## 2015-12-25 NOTE — [facility] Group Notes (Signed)
[facility] LCSW Group Therapy  12/25/2015 3:15 PM  Type of Therapy:  Group Therapy  Participation Level:  Did Not Attend  Modes of Intervention:  Discussion, Education, Socialization and Support  Summary of Progress/Problems: Balance in life: Patients will discuss the concept of balance and how it looks and feels to be unbalanced. Pt will identify areas in their life that is unbalanced and ways to become more balanced.    Derelle Cockrell L Itza Maniaci MSW, LCSWA  12/25/2015, 3:15 PM   

## 2015-12-25 NOTE — Anesthesia Postprocedure Evaluation (Signed)
Anesthesia Post Note  Patient: Christian [name]  Procedure(s) Performed: * No procedures listed *  Patient location during evaluation: PACU Anesthesia Type: General Level of consciousness: awake and alert Pain management: pain level controlled Vital Signs Assessment: post-procedure vital signs reviewed and stable Respiratory status: spontaneous breathing, nonlabored ventilation, respiratory function stable and patient connected to nasal cannula oxygen Cardiovascular status: blood pressure returned to baseline and stable Postop Assessment: no signs of nausea or vomiting Anesthetic complications: no    Last Vitals:  Filed Vitals:   12/24/15 2007 12/25/15 0700  BP: 121/71 119/68  Pulse: 95 101  Temp: 37.2 C   Resp: 20 20    Last Pain:  Filed Vitals:   12/25/15 0813  PainSc: 0-No pain                 Lenard Simmer

## 2015-12-25 NOTE — Progress Notes (Signed)
Recreation Therapy Notes  INPATIENT RECREATION THERAPY ASSESSMENT  Patient Details Name: Christian [name] MRN: [id number] DOB: Nov 12, 1951 Today's Date: 12/25/2015  Patient Stressors: Other (Comment) (Bills)  Coping Skills:   Isolate, Avoidance, Exercise, Music, Sports  Personal Challenges: Concentration, Decision-Making, Expressing Yourself, Problem-Solving, Relationships, Self-Esteem/Confidence, Social Interaction, Stress Management, Time Management, Trusting Others  Leisure Interests (2+):  Sports - Basketball, Individual - Other (Comment) (Sing)  Awareness of Community Resources:  Yes  Community Resources:  [employer], [employer]  Current Use: No  If no, Barriers?: Other (Comment) (Just doesn't go)  Patient Strengths:  "I'm struggline to find two things"  Patient Identified Areas of Improvement:  Communication  Current Recreation Participation:  Watch TV  Patient Goal for Hospitalization:  To get better  [place] of Residence:  [place] of Residence:  [place]   Current SI (including self-harm):  No  Current HI:  No  Consent to Intern Participation: N/A   Jacquelynn Cree, LRT/CTRS 12/25/2015, 2:43 PM

## 2015-12-25 NOTE — Plan of Care (Signed)
Problem: Ineffective individual coping Goal: STG: Patient will remain free from self harm Outcome: Progressing Mo self harm

## 2015-12-25 NOTE — Progress Notes (Addendum)
[facility] MD Progress Note  12/25/2015 8:36 PM Christian [name]  MRN:  [id number] Subjective:  Follow-up for this 65 year old man with bipolar disorder. Patient tells me that he is feeling "about the same" today but also says he is not having any suicidal thinking. He says he is focused on wanting to get out of the hospital. Denies feeling particularly depressed. Denies any homicidal thoughts. Still expresses regret about what happened at home and can give any better explanation for it. Patient is tolerating ECT and current medication well.  Principal Problem: Bipolar disorder, current episode depressed, severe, with psychotic features ([place]) Diagnosis:   Patient Active Problem List   Diagnosis Date Noted  . Bipolar I disorder, most recent episode depressed, severe w psychosis ([place]) [F31.5] 12/22/2015  . Bipolar affective disorder, depressed, severe, with psychotic behavior ([place]) [F31.5]   . Bipolar I disorder, current or most recent episode depressed, with psychotic features ([place]) [F31.5]   . Bipolar I disorder, most recent episode depressed ([place]) [F31.30]   . Severe bipolar I disorder with depression ([place]) [F31.4]   . Bipolar I disorder, most recent episode depressed, severe with psychotic features ([place]) [F31.5]   . Overdose [T50.901A] 09/11/2015  . Acute respiratory failure with hypoxia ([place]) [J96.01] 09/05/2015  . Elevated troponin [R79.89] 09/05/2015  . Somnolence [R40.0] 09/05/2015  . Bipolar depression ([place]) [F31.30] 09/05/2015  . Acute bilateral deep vein thrombosis (DVT) of femoral veins (HCC) [I82.413] 09/05/2015  . Bilateral pulmonary embolism ([place]) [I26.99] 09/02/2015  . Labile hypertension [I10]   . Pulmonary embolism with acute cor pulmonale ([place]) [I26.09]   . Dyspnea [R06.00]   . Orthostatic hypotension [I95.1]   . Bipolar disorder, in partial remission, most recent episode depressed ([place]) [F31.75]   . Syncope, near [R55] 08/31/2015  . Affective psychosis, bipolar ([place]) [F31.9]    . Bipolar disorder, now depressed ([place]) [F31.30] 08/28/2015  . Bipolar I disorder, severe, current or most recent episode depressed, with psychotic features ([place]) [F31.5]   . Pressure ulcer [L89.90] 07/30/2015  . Protein-calorie malnutrition, severe ([place]) [E43] 07/30/2015  . Dehydration [E86.0] 07/29/2015  . Bipolar disorder, current episode depressed, severe, with psychotic features ([place]) [F31.5] 07/09/2015  . Diabetes ([place]) [E11.9] 07/09/2015  . UTI (urinary tract infection) [N39.0] 06/19/2014  . Positive blood culture [R78.81] 06/19/2014  . Bipolar affective disorder, current episode manic with psychotic symptoms ([employer] [place]) [F31.2] 06/17/2014  . Type II or unspecified type diabetes mellitus with unspecified complication, uncontrolled [E11.8, E11.65] 06/02/2014  . Other and unspecified hyperlipidemia [E78.5] 06/02/2014  . OSA on CPAP [G47.33] 06/02/2014  . 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 Gout [M10.9] 06/02/2014  . Hypertension [I10]    Total Time spent with patient: 30 minutes  Past Psychiatric History: Patient has a long history of bipolar disorder with both depressed and manic phases in the past a history of suicide attempts and now a history of aggression towards his wife. Has responded well to ECT but is very fragile and his illness with a tendency to relapse quickly.  Past Medical History:  Past Medical History  Diagnosis Date  . Mood swings ([place])   . Type II or unspecified type diabetes mellitus with unspecified complication, uncontrolled 06/02/2014  . Other and unspecified hyperlipidemia 06/02/2014  . OSA on CPAP 06/02/2014  . 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 Bipolar disorder ([place])   . Hypertension   . Gout   . Bipolar 1 disorder ([place])   .  Hx of blood clots    History reviewed. No pertinent past surgical history. Family History:  Family History  Problem Relation Age of Onset   . Dementia Mother   . Arthritis Father   . Hypertension Father   . Heart failure Neg Hx   . Kidney failure Neg Hx   . Cancer Neg Hx   . Mental illness Sister   . Diabetes Sister    Family Psychiatric  History: Positive family history for anxiety and bipolar disorder Social History:  History  Alcohol Use No     History  Drug Use No    Social History   Social History  . Marital Status: Married    Spouse Name: N/A  . Number of Children: N/A  . Years of Education: N/A   Social History Main Topics  . Smoking status: Never Smoker   . Smokeless tobacco: None  . Alcohol Use: No  . Drug Use: No  . Sexual Activity: Not Currently   Other Topics Concern  . None   Social History Narrative   *The patient has been married for over 30 years and is currently on disability. He lives with his wife in the [place] area and has one daughter.             Additional Social History:                         Sleep: Fair  Appetite:  Fair  Current Medications: Current Facility-Administered Medications  Medication Dose Route Frequency Provider Last Rate Last Dose  . acetaminophen (TYLENOL) tablet 650 mg  650 mg Oral Q6H PRN Gonzella Lex, MD   650 mg at 12/23/15 2315  . allopurinol (ZYLOPRIM) tablet 300 mg  300 mg Oral Daily Gonzella Lex, MD   300 mg at 12/25/15 0745  . alum & mag hydroxide-simeth (MAALOX/MYLANTA) 200-200-20 MG/5ML suspension 30 mL  30 mL Oral Q4H PRN Gonzella Lex, MD      . apixaban (ELIQUIS) tablet 5 mg  5 mg Oral BID Gonzella Lex, MD      . aspirin EC tablet 81 mg  81 mg Oral Daily Gonzella Lex, MD   81 mg at 12/25/15 0745  . atorvastatin (LIPITOR) tablet 10 mg  10 mg Oral q1800 Gonzella Lex, MD   10 mg at 12/25/15 1744  . docusate sodium (COLACE) capsule 100 mg  100 mg Oral BID Gonzella Lex, MD   100 mg at 12/25/15 0745  . finasteride (PROSCAR) tablet 5 mg  5 mg Oral Daily Gonzella Lex, MD   5 mg at 12/25/15 0744  . furosemide (LASIX)  tablet 20 mg  20 mg Oral Daily Gonzella Lex, MD   20 mg at 12/25/15 0745  . labetalol (NORMODYNE,TRANDATE) injection 20 mg  20 mg Intravenous Once Gonzella Lex, MD   20 mg at 12/24/15 1348  . lisinopril (PRINIVIL,ZESTRIL) tablet 40 mg  40 mg Oral Daily Jolanta B Pucilowska, MD   40 mg at 12/25/15 0745  . magnesium hydroxide (MILK OF MAGNESIA) suspension 30 mL  30 mL Oral Daily PRN Gonzella Lex, MD      . metFORMIN (GLUCOPHAGE) tablet 500 mg  500 mg Oral BID WC Gonzella Lex, MD   500 mg at 12/25/15 1741  . mirtazapine (REMERON) tablet 45 mg  45 mg Oral QHS Gonzella Lex, MD   45 mg at 12/24/15 2240  .  OLANZapine (ZYPREXA) tablet 20 mg  20 mg Oral QHS Gonzella Lex, MD   20 mg at 12/24/15 2240  . polyethylene glycol (MIRALAX / GLYCOLAX) packet 17 g  17 g Oral Daily Gonzella Lex, MD   17 g at 12/22/15 1542  . tamsulosin (FLOMAX) capsule 0.4 mg  0.4 mg Oral QPC supper Gonzella Lex, MD   0.4 mg at 12/25/15 1744    Lab Results:  Results for orders placed or performed during the hospital encounter of 12/22/15 (from the past 48 hour(s))  CBC     Status: None   Collection Time: 12/25/15  7:08 AM  Result Value Ref Range   WBC 4.6 3.8 - 10.6 K/uL   RBC 4.91 4.40 - 5.90 MIL/uL   Hemoglobin 14.5 13.0 - 18.0 g/dL   HCT 43.7 40.0 - 52.0 %   MCV 89.0 80.0 - 100.0 fL   MCH 29.6 26.0 - 34.0 pg   MCHC 33.3 32.0 - 36.0 g/dL   RDW 12.9 11.5 - 14.5 %   Platelets 219 150 - 440 K/uL  Creatinine, serum     Status: None   Collection Time: 12/25/15  7:08 AM  Result Value Ref Range   Creatinine, Ser 1.14 0.61 - 1.24 mg/dL   GFR calc non Af Amer >60 >60 mL/min   GFR calc Af Amer >60 >60 mL/min    Comment: (NOTE) The eGFR has been calculated using the CKD EPI equation. This calculation has not been validated in all clinical situations. eGFR's persistently <60 mL/min signify possible Chronic Kidney Disease.     Physical Findings: AIMS: Facial and Oral Movements Muscles of Facial Expression:  None, normal Lips and Perioral Area: None, normal Jaw: None, normal Tongue: None, normal,Extremity Movements Upper (arms, wrists, hands, fingers): None, normal Lower (legs, knees, ankles, toes): None, normal, Trunk Movements Neck, shoulders, hips: None, normal, Overall Severity Severity of abnormal movements (highest score from questions above): None, normal Incapacitation due to abnormal movements: None, normal Patient's awareness of abnormal movements (rate only patient's report): No Awareness, Dental Status Current problems with teeth and/or dentures?: No Does patient usually wear dentures?: No  CIWA:    COWS:     Musculoskeletal: Strength & Muscle Tone: within normal limits Gait & Station: unsteady Patient leans: N/A  Psychiatric Specialty Exam: Review of Systems  Constitutional: Negative.   HENT: Negative.   Eyes: Negative.   Respiratory: Negative.   Cardiovascular: Negative.   Gastrointestinal: Negative.   Musculoskeletal: Negative.   Skin: Negative.   Neurological: Negative.   Psychiatric/Behavioral: Positive for memory loss. Negative for depression, suicidal ideas, hallucinations and substance abuse. The patient has insomnia. The patient is not nervous/anxious.     Blood pressure 119/68, pulse 101, temperature 99 F (37.2 C), temperature source Oral, resp. rate 20, height 6' (1.829 m), weight 104.327 kg (230 lb), SpO2 98 %.Body mass index is 31.19 kg/(m^2).  General Appearance: Disheveled  Eye Sport and exercise psychologist::  Fair  Speech:  Slow  Volume:  Decreased  Mood:  Dysphoric  Affect:  Flat  Thought Process:  Goal Directed  Orientation:  Full (Time, Place, and Person)  Thought Content:  Rumination  Suicidal Thoughts:  No  Homicidal Thoughts:  No  Memory:  Immediate;   Fair Recent;   Fair Remote;   Fair  Judgement:  Impaired  Insight:  Fair  Psychomotor Activity:  Decreased  Concentration:  Fair  Recall:  [employer] of Knowledge:Fair  Language: Fair  Akathisia:  No  Handed:  Right  AIMS (if indicated):     Assets:  Desire for Improvement Financial Resources/Insurance Housing Resilience Social Support  ADL's:  Impaired  Cognition: Impaired,  Mild  Sleep:  Number of Hours: 6.3   Treatment Plan Summary: Daily contact with patient to assess and evaluate symptoms and progress in treatment, Medication management and Plan Patient will have ECT again tomorrow. He would like to take a pause in the treatment but is agreeable to having treatment tomorrow. If his mood appears to be stabilizing we may pause on Monday because of some concern over worsening his cognitive problems. Continue current medicine including the Zyprexa. His blood pressure is actually doing quite good right now I am going to add a small dose of hydralazine orally in the morning prior to treatment to help with the high blood pressure that he has during ECT. Blood sugars are doing alright. Spoke with medicine today and we have clarified that his anticoagulant should be at twice a day dosing.. We will discontinue the one-to-one observation. He is walking very stably with his walker and has not been engaging in any dangerous behavior. Plan reviewed with nursing who are in agreement [place] 12/25/2015, 8:36 PM

## 2015-12-26 ENCOUNTER — Inpatient Hospital Stay (HOSPITAL_COMMUNITY)
Admission: RE | Admit: 2015-12-26 | Discharge: 2015-12-26 | Disposition: A | Discharge status: Home / Self Care | Payer: Commercial Managed Care - HMO | Source: Ambulatory Visit | Attending: Psychiatry | Admitting: Psychiatry

## 2015-12-26 ENCOUNTER — Inpatient Hospital Stay: Payer: Commercial Managed Care - HMO | Admitting: Anesthesiology

## 2015-12-26 DIAGNOSIS — I10 Essential (primary) hypertension: Secondary | ICD-10-CM | POA: Diagnosis not present

## 2015-12-26 DIAGNOSIS — F315 Bipolar disorder, current episode depressed, severe, with psychotic features: Secondary | ICD-10-CM | POA: Diagnosis not present

## 2015-12-26 MED ORDER — SUCCINYLCHOLINE 20MG/ML (10ML) SYRINGE FOR MEDFUSION PUMP - OPTIME
INTRAMUSCULAR | Status: DC | PRN
  Administered 2015-12-26: 150 mg via INTRAVENOUS

## 2015-12-26 MED ORDER — LORAZEPAM 2 MG/ML IJ SOLN
INTRAMUSCULAR | Status: DC | PRN
  Administered 2015-12-26: 20 mg via INTRAVENOUS

## 2015-12-26 MED ORDER — SODIUM CHLORIDE 0.9 % IV SOLN
INTRAVENOUS | Status: DC | PRN
  Administered 2015-12-26: 12:00:00 via INTRAVENOUS

## 2015-12-26 MED ORDER — SODIUM CHLORIDE 0.9 % IV SOLN
250.0000 mL | Freq: Once | INTRAVENOUS | Status: AC
  Administered 2015-12-26: 500 mL via INTRAVENOUS

## 2015-12-26 MED ORDER — KETAMINE HCL 10 MG/ML IJ SOLN
INTRAMUSCULAR | Status: DC | PRN
  Administered 2015-12-26: 120 mg via INTRAVENOUS

## 2015-12-26 NOTE — Plan of Care (Signed)
Problem: Alteration in mood Goal: LTG-Patient reports reduction in suicidal thoughts (Patient reports reduction in suicidal thoughts and is able to verbalize a safety plan for whenever patient is feeling suicidal)  Outcome: Progressing Pt denies SI     

## 2015-12-26 NOTE — H&P (Signed)
Christian [name] is an 65 y.o. male.   Chief Complaint: Patient this morning reports that he is feeling much worse. Yesterday evening he was better. Seems to of slip back into a lot of depression and withdrawn negative thinking. HPI: Patient with severe bipolar disorder with recurrent episodes of depression or as slipped back into a bad depression with recent bizarre behavior  Past Medical History  Diagnosis Date  . Mood swings ([place])   . Type II or unspecified type diabetes mellitus with unspecified complication, uncontrolled 06/02/2014  . Other and unspecified hyperlipidemia 06/02/2014  . OSA on CPAP 06/02/2014  . 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 Bipolar disorder ([place])   . Hypertension   . Gout   . Bipolar 1 disorder ([place])   . Hx of blood clots     History reviewed. No pertinent past surgical history.  Family History  Problem Relation Age of Onset  . Dementia Mother   . Arthritis Father   . Hypertension Father   . Heart failure Neg Hx   . Kidney failure Neg Hx   . Cancer Neg Hx   . Mental illness Sister   . Diabetes Sister    Social History:  reports that he has never smoked. He does not have any smokeless tobacco history on file. He reports that he does not drink alcohol or use illicit drugs.  Allergies: No Known Allergies   (Not in a hospital admission)  Results for orders placed or performed during the hospital encounter of 12/22/15 (from the past 48 hour(s))  CBC     Status: None   Collection Time: 12/25/15  7:08 AM  Result Value Ref Range   WBC 4.6 3.8 - 10.6 K/uL   RBC 4.91 4.40 - 5.90 MIL/uL   Hemoglobin 14.5 13.0 - 18.0 g/dL   HCT 43.7 40.0 - 52.0 %   MCV 89.0 80.0 - 100.0 fL   MCH 29.6 26.0 - 34.0 pg   MCHC 33.3 32.0 - 36.0 g/dL   RDW 12.9 11.5 - 14.5 %   Platelets 219 150 - 440 K/uL  Creatinine, serum     Status: None   Collection Time: 12/25/15  7:08 AM  Result Value Ref Range   Creatinine, Ser 1.14 0.61 - 1.24  mg/dL   GFR calc non Af Amer >60 >60 mL/min   GFR calc Af Amer >60 >60 mL/min    Comment: (NOTE) The eGFR has been calculated using the CKD EPI equation. This calculation has not been validated in all clinical situations. eGFR's persistently <60 mL/min signify possible Chronic Kidney Disease.    No results found.  Review of Systems  Constitutional: Negative.   HENT: Negative.   Eyes: Negative.   Respiratory: Negative.   Cardiovascular: Negative.   Gastrointestinal: Negative.   Musculoskeletal: Negative.   Skin: Negative.   Neurological: Negative.   Psychiatric/Behavioral: Positive for depression and memory loss. Negative for suicidal ideas, hallucinations and substance abuse. The patient is nervous/anxious. The patient does not have insomnia.     Blood pressure 133/85, pulse 95, temperature 97.1 F (36.2 C), temperature source Tympanic, resp. rate 18, weight 104.327 kg (230 lb), SpO2 100 %. Physical Exam  Nursing note and vitals reviewed. Constitutional: He appears well-developed and well-nourished.  HENT:  Head: Normocephalic and atraumatic.  Eyes: Conjunctivae are normal. Pupils are equal, round, and reactive to light.  Neck: Normal range of motion.  Cardiovascular: Normal rate, regular rhythm and normal heart sounds.  Respiratory: Effort normal and breath sounds normal.  GI: Soft.  Musculoskeletal: Normal range of motion.  Neurological: He is alert.  Skin: Skin is warm and dry.  Psychiatric: Judgment and thought content normal. His speech is delayed. He is slowed. He exhibits a depressed mood. He exhibits abnormal recent memory.     Assessment/Plan Patient is receiving bilateral ECT this is his third of this series of index treatments.  Shanyia Stines 12/26/2015, 12:19 PM

## 2015-12-26 NOTE — Progress Notes (Signed)
D:  Per pt self inventory pt reports sleeping fair, appetite fair, energy level low, ability to pay attention poor, rates depression at a 5 out of 10, hopelessness at a 5 out of 10, anxiety at a 5 out of 10, denies SI/HI/AVH presently, admits to occasional SI, goal today: "staying on point, follow directions", delayed response time, depressed/flat during interaction.     A:  Emotional support provided, Encouraged pt to continue with treatment plan and attend all group activities, q15 min checks maintained for safety.  R:  Pt is not going to groups, is getting ECT treatments three days a week, cooperative with staff and other patients on the unit.

## 2015-12-26 NOTE — Progress Notes (Signed)
D: Observed pt in room. Patient alert and oriented x4. Patient denies SI/HI/AVH. Pt affect is depressed. Pt guarded and forwards little. Pt stated "ECT wiped me out yesterday" and indicated he still felt tired from it today. Pt indicated that he talked to his wife and "she's still scared." Pt rates depression 5/10 and anxiety 5/10. Pt has no complaints. Pt refusing colace, indicating that he had a solid bowel movement  "2 days ago." A: Offered active listening and support. Provided therapeutic communication. Administered scheduled medications. Enquired about pt bowel movements, and educated pt on colace need. R: Pt verbalized understanding of education. Pt pleasant and cooperative. Pt medication compliant, except for colace refusal. Will continue Q15 min. checks. Safety maintained.

## 2015-12-26 NOTE — Progress Notes (Signed)
CBG 167, docked machine and it did not transfer into system

## 2015-12-26 NOTE — Procedures (Signed)
ECT SERVICES Physician's Interval Evaluation & Treatment Note  Patient Identification: Christian [name] MRN:  [id number] Date of Evaluation:  12/26/2015 TX #: [id number]  MADRS: 29  MMSE: 30  P.E. Findings:  Patient continues to have normal blood pressure outside ECT.  Psychiatric Interval Note:  Mood continues to be down. Seems to be going up and down day today but certainly much worse today. Still seems to be confused and very withdrawn  Subjective:  Patient is a 65 y.o. male seen for evaluation for Electroconvulsive Therapy. Patient doesn't have a specific complaint  Treatment Summary:   []   Right Unilateral             [x]  Bilateral   % Energy : 1.0 ms 100%   Impedance: 600 ohms  Seizure Energy Index: 3199 V squared  Postictal Suppression Index: 65%  Seizure Concordance Index: 99%  Medications  Pre Shock: Labetalol 20 mg ketamine 120 mg succinylcholine 150 mg  Post Shock: None  Seizure Duration: EMG 38 seconds EEG 67 seconds   Comments: Patient will follow-up with continued 3 times a week scheduled into next week   Lungs:  [x]   Clear to auscultation               []  Other:   Heart:    [x]   Regular rhythm             []  irregular rhythm    [x]   Previous H&P reviewed, patient examined and there are NO CHANGES                 []   Previous H&P reviewed, patient examined and there are changes noted.   [name], MD 1/27/201712:21 PM

## 2015-12-26 NOTE — [facility] Group Notes (Signed)
[facility] LCSW Aftercare Discharge Planning Group Note   12/26/2015 2:43 PM  Participation Quality:  Active  Mood/Affect:  Appropriate  Depression Rating:  Pt did not report depression.  Anxiety Rating:  Pt did not report anxiety.  Thoughts of Suicide:  No Will you contract for safety?   NA  Current AVH:  No  Plan for Discharge/Comments:  Patient actively and appropriately participated in the session. Patient reported that his goals include taking his medication and speaking with his CSW regarding discharge. Patient shared that he has his own apartment and plans to follow up with outpatient services through his [facility] ACT Team.  Transportation Means: Pt reports that he will need assistance in taking public transportation.  Supports: Pt receives mental health services through [employer]. ([facility])  Jenel Lucks, CSW Intern 12/26/15

## 2015-12-26 NOTE — [facility] Group Notes (Signed)
[facility] LCSW Group Therapy  12/26/2015 1:50 PM  Type of Therapy:  Group Therapy  Participation Level:  Did Not Attend  Modes of Intervention:  Discussion, Education, Socialization and Support  Summary of Progress/Problems: Feelings around Relapse. Group members discussed the meaning of relapse and shared personal stories of relapse, how it affected them and others, and how they perceived themselves during this time. Group members were encouraged to identify triggers, warning signs and coping skills used when facing the possibility of relapse. Social supports were discussed and explored in detail.   Aldine Grainger L Amilya Haver MSW, LCSWA  12/26/2015, 1:50 PM

## 2015-12-26 NOTE — Anesthesia Procedure Notes (Signed)
Date/Time: 12/26/2015 12:28 PM Performed by: Lily Kocher Pre-anesthesia Checklist: Patient identified, Emergency Drugs available, Suction available and Patient being monitored Patient Re-evaluated:Patient Re-evaluated prior to inductionOxygen Delivery Method: Circle system utilized Preoxygenation: Pre-oxygenation with 100% oxygen Intubation Type: IV induction Ventilation: Mask ventilation without difficulty and Mask ventilation throughout procedure Airway Equipment and Method: Bite block Placement Confirmation: positive ETCO2 Dental Injury: Teeth and Oropharynx as per pre-operative assessment

## 2015-12-26 NOTE — Anesthesia Preprocedure Evaluation (Addendum)
Anesthesia Evaluation  Patient identified by MRN, date of birth, ID band Patient awake    Reviewed: Allergy & Precautions, H&P , NPO status , Patient's Chart, lab work & pertinent test results, Unable to perform ROS - Chart review only  History of Anesthesia Complications Negative for: history of anesthetic complications  Airway Mallampati: IV  TM Distance: >3 FB Neck ROM: Limited    Dental  (+) Missing, Poor Dentition   Pulmonary shortness of breath and with exertion, sleep apnea and Continuous Positive Airway Pressure Ventilation ,    Pulmonary exam normal        Cardiovascular Exercise Tolerance: Poor hypertension, Pt. on medications + Peripheral Vascular Disease  Normal cardiovascular exam     Neuro/Psych PSYCHIATRIC DISORDERS Depression Bipolar Disorder    GI/Hepatic neg GERD  ,  Endo/Other  diabetes, Type 2BG 259 this morning.  Renal/GU      Musculoskeletal   Abdominal (+) + obese,   Peds  Hematology   Anesthesia Other Findings Past Medical History:   Mood swings (HCC)                                            Type II or unspecified type diabetes mellitus * 06/02/2014     Other and unspecified hyperlipidemia            06/02/2014     OSA on CPAP                                     06/02/2014     Bilateral lower extremity edema: chronic with * 06/02/2014     Bipolar disorder (HCC)                                       Hypertension                                                 Gout                                                         Bipolar 1 disorder (HCC)                                     Hx of blood clots                                           BMI    Body Mass Index   31.18 kg/m 2      Reproductive/Obstetrics                             Anesthesia Physical  Anesthesia Plan  ASA: III    Anesthesia Plan: General   Post-op Pain Management:    Induction:  Intravenous  Airway Management Planned: Mask  Additional Equipment:   Intra-op Plan:   Post-operative Plan:   Informed Consent: I have reviewed the patients History and Physical, chart, labs and discussed the procedure including the risks, benefits and alternatives for the proposed anesthesia with the patient or authorized representative who has indicated his/her understanding and acceptance.   Dental advisory given  Plan Discussed with: CRNA, Anesthesiologist and Surgeon  Anesthesia Plan Comments:         Anesthesia Quick Evaluation  

## 2015-12-26 NOTE — Progress Notes (Signed)
[facility] MD Progress Note  12/26/2015 5:36 PM Christian [name]  MRN:  [id number] Subjective:  Follow-up for this 65 year old man with bipolar disorder depressed. Patient had bilateral ECT this morning which was tolerated well. On interview this afternoon he is awake but barely interactive. He tells me he is feeling bad. He has a hard time describing what he means by that. He tells me that he is on functional which is obvious but can't tell me whether this is coming from something more physical or mental. He claims he is not having any suicidal or homicidal ideation. Patient looks very withdrawn and is much less interactive than he was yesterday. This is a pattern we have seen in the past that was often associated with when he was having psychotic symptoms. Physically he appears to be fairly stable blood pressure is actually doing quite well now blood sugars are adequate.  Principal Problem: Bipolar disorder, current episode depressed, severe, with psychotic features ([place]) Diagnosis:   Patient Active Problem List   Diagnosis Date Noted  . Bipolar I disorder, most recent episode depressed, severe w psychosis ([place]) [F31.5] 12/22/2015  . Bipolar affective disorder, depressed, severe, with psychotic behavior ([place]) [F31.5]   . Bipolar I disorder, current or most recent episode depressed, with psychotic features ([place]) [F31.5]   . Bipolar I disorder, most recent episode depressed ([place]) [F31.30]   . Severe bipolar I disorder with depression ([place]) [F31.4]   . Bipolar I disorder, most recent episode depressed, severe with psychotic features ([place]) [F31.5]   . Overdose [T50.901A] 09/11/2015  . Acute respiratory failure with hypoxia ([place]) [J96.01] 09/05/2015  . Elevated troponin [R79.89] 09/05/2015  . Somnolence [R40.0] 09/05/2015  . Bipolar depression ([place]) [F31.30] 09/05/2015  . Acute bilateral deep vein thrombosis (DVT) of femoral veins (HCC) [I82.413] 09/05/2015  . Bilateral pulmonary embolism ([place]) [I26.99]  09/02/2015  . Labile hypertension [I10]   . Pulmonary embolism with acute cor pulmonale ([place]) [I26.09]   . Dyspnea [R06.00]   . Orthostatic hypotension [I95.1]   . Bipolar disorder, in partial remission, most recent episode depressed ([place]) [F31.75]   . Syncope, near [R55] 08/31/2015  . Affective psychosis, bipolar ([place]) [F31.9]   . Bipolar disorder, now depressed ([place]) [F31.30] 08/28/2015  . Bipolar I disorder, severe, current or most recent episode depressed, with psychotic features ([place]) [F31.5]   . Pressure ulcer [L89.90] 07/30/2015  . Protein-calorie malnutrition, severe ([place]) [E43] 07/30/2015  . Dehydration [E86.0] 07/29/2015  . Bipolar disorder, current episode depressed, severe, with psychotic features ([place]) [F31.5] 07/09/2015  . Diabetes ([place]) [E11.9] 07/09/2015  . UTI (urinary tract infection) [N39.0] 06/19/2014  . Positive blood culture [R78.81] 06/19/2014  . Bipolar affective disorder, current episode manic with psychotic symptoms ([place]) [F31.2] 06/17/2014  . Type II or unspecified type diabetes mellitus with unspecified complication, uncontrolled [E11.8, E11.65] 06/02/2014  . Other and unspecified hyperlipidemia [E78.5] 06/02/2014  . OSA on CPAP [G47.33] 06/02/2014  . 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 Gout [M10.9] 06/02/2014  . Hypertension [I10]    Total Time spent with patient: 30 minutes  Past Psychiatric History: Patient has a long history of bipolar disorder with both depressed and manic phases in the past a history of suicide attempts and now a history of aggression towards his wife. Has responded well to ECT but is very fragile and his illness with a tendency to relapse quickly.  Past Medical History:  Past Medical History  Diagnosis Date  . Mood swings (  [place])   . Type II or unspecified type diabetes mellitus with unspecified complication, uncontrolled 06/02/2014  . Other and unspecified hyperlipidemia 06/02/2014   . OSA on CPAP 06/02/2014  . 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 Bipolar disorder ([place])   . Hypertension   . Gout   . Bipolar 1 disorder ([place])   . Hx of blood clots    History reviewed. No pertinent past surgical history. Family History:  Family History  Problem Relation Age of Onset  . Dementia Mother   . Arthritis Father   . Hypertension Father   . Heart failure Neg Hx   . Kidney failure Neg Hx   . Cancer Neg Hx   . Mental illness Sister   . Diabetes Sister    Family Psychiatric  History: Positive family history for anxiety and bipolar disorder Social History:  History  Alcohol Use No     History  Drug Use No    Social History   Social History  . Marital Status: Married    Spouse Name: N/A  . Number of Children: N/A  . Years of Education: N/A   Social History Main Topics  . Smoking status: Never Smoker   . Smokeless tobacco: None  . Alcohol Use: No  . Drug Use: No  . Sexual Activity: Not Currently   Other Topics Concern  . None   Social History Narrative   *The patient has been married for over 30 years and is currently on disability. He lives with his wife in the [place] area and has one daughter.             Additional Social History:                         Sleep: Fair  Appetite:  Fair  Current Medications: Current Facility-Administered Medications  Medication Dose Route Frequency Provider Last Rate Last Dose  . acetaminophen (TYLENOL) tablet 650 mg  650 mg Oral Q6H PRN Gonzella Lex, MD   650 mg at 12/23/15 2315  . allopurinol (ZYLOPRIM) tablet 300 mg  300 mg Oral Daily Gonzella Lex, MD   300 mg at 12/26/15 1400  . alum & mag hydroxide-simeth (MAALOX/MYLANTA) 200-200-20 MG/5ML suspension 30 mL  30 mL Oral Q4H PRN Gonzella Lex, MD      . apixaban (ELIQUIS) tablet 5 mg  5 mg Oral BID Gonzella Lex, MD   5 mg at 12/26/15 1402  . aspirin EC tablet 81 mg  81 mg Oral Daily Gonzella Lex, MD    81 mg at 12/26/15 1402  . atorvastatin (LIPITOR) tablet 10 mg  10 mg Oral q1800 Gonzella Lex, MD   10 mg at 12/26/15 1657  . docusate sodium (COLACE) capsule 100 mg  100 mg Oral BID Gonzella Lex, MD   100 mg at 12/25/15 2209  . finasteride (PROSCAR) tablet 5 mg  5 mg Oral Daily Gonzella Lex, MD   5 mg at 12/26/15 1401  . furosemide (LASIX) tablet 20 mg  20 mg Oral Daily Gonzella Lex, MD   20 mg at 12/25/15 0745  . labetalol (NORMODYNE,TRANDATE) injection 20 mg  20 mg Intravenous Once Gonzella Lex, MD   20 mg at 12/24/15 1348  . lisinopril (PRINIVIL,ZESTRIL) tablet 40 mg  40 mg Oral Daily Clovis Fredrickson, MD   40 mg at 12/26/15 0649  . magnesium hydroxide (MILK  OF MAGNESIA) suspension 30 mL  30 mL Oral Daily PRN Gonzella Lex, MD      . metFORMIN (GLUCOPHAGE) tablet 500 mg  500 mg Oral BID WC Gonzella Lex, MD   500 mg at 12/26/15 1657  . mirtazapine (REMERON) tablet 45 mg  45 mg Oral QHS Gonzella Lex, MD   45 mg at 12/25/15 2208  . OLANZapine (ZYPREXA) tablet 20 mg  20 mg Oral QHS Gonzella Lex, MD   20 mg at 12/25/15 2208  . polyethylene glycol (MIRALAX / GLYCOLAX) packet 17 g  17 g Oral Daily Gonzella Lex, MD   17 g at 12/22/15 1542  . tamsulosin (FLOMAX) capsule 0.4 mg  0.4 mg Oral QPC supper Gonzella Lex, MD   0.4 mg at 12/26/15 1657    Lab Results:  Results for orders placed or performed during the hospital encounter of 12/22/15 (from the past 48 hour(s))  CBC     Status: None   Collection Time: 12/25/15  7:08 AM  Result Value Ref Range   WBC 4.6 3.8 - 10.6 K/uL   RBC 4.91 4.40 - 5.90 MIL/uL   Hemoglobin 14.5 13.0 - 18.0 g/dL   HCT 43.7 40.0 - 52.0 %   MCV 89.0 80.0 - 100.0 fL   MCH 29.6 26.0 - 34.0 pg   MCHC 33.3 32.0 - 36.0 g/dL   RDW 12.9 11.5 - 14.5 %   Platelets 219 150 - 440 K/uL  Creatinine, serum     Status: None   Collection Time: 12/25/15  7:08 AM  Result Value Ref Range   Creatinine, Ser 1.14 0.61 - 1.24 mg/dL   GFR calc non Af Amer >60 >60  mL/min   GFR calc Af Amer >60 >60 mL/min    Comment: (NOTE) The eGFR has been calculated using the CKD EPI equation. This calculation has not been validated in all clinical situations. eGFR's persistently <60 mL/min signify possible Chronic Kidney Disease.     Physical Findings: AIMS: Facial and Oral Movements Muscles of Facial Expression: None, normal Lips and Perioral Area: None, normal Jaw: None, normal Tongue: None, normal,Extremity Movements Upper (arms, wrists, hands, fingers): None, normal Lower (legs, knees, ankles, toes): None, normal, Trunk Movements Neck, shoulders, hips: None, normal, Overall Severity Severity of abnormal movements (highest score from questions above): None, normal Incapacitation due to abnormal movements: None, normal Patient's awareness of abnormal movements (rate only patient's report): No Awareness, Dental Status Current problems with teeth and/or dentures?: No Does patient usually wear dentures?: No  CIWA:    COWS:     Musculoskeletal: Strength & Muscle Tone: within normal limits Gait & Station: unsteady Patient leans: N/A  Psychiatric Specialty Exam: Review of Systems  Constitutional: Negative.   HENT: Negative.   Eyes: Negative.   Respiratory: Negative.   Cardiovascular: Negative.   Gastrointestinal: Negative.   Musculoskeletal: Negative.   Skin: Negative.   Neurological: Negative.   Psychiatric/Behavioral: Positive for memory loss. Negative for depression, suicidal ideas, hallucinations and substance abuse. The patient has insomnia. The patient is not nervous/anxious.     Blood pressure 124/67, pulse 97, temperature 98.9 F (37.2 C), temperature source Oral, resp. rate 20, height 6' (1.829 m), weight 104.327 kg (230 lb), SpO2 98 %.Body mass index is 31.19 kg/(m^2).  General Appearance: Disheveled  Eye Sport and exercise psychologist::  Fair  Speech:  Slow  Volume:  Decreased  Mood:  Dysphoric  Affect:  Flat  Thought Process:  Goal Directed   Orientation:  Full (Time, Place, and Person)  Thought Content:  Rumination  Suicidal Thoughts:  No  Homicidal Thoughts:  No  Memory:  Immediate;   Fair Recent;   Fair Remote;   Fair  Judgement:  Impaired  Insight:  Fair  Psychomotor Activity:  Decreased  Concentration:  Fair  Recall:  [employer] of Knowledge:Fair  Language: Fair  Akathisia:  No  Handed:  Right  AIMS (if indicated):     Assets:  Desire for Improvement Financial Resources/Insurance Housing Resilience Social Support  ADL's:  Impaired  Cognition: Impaired,  Mild  Sleep:  Number of Hours: 7.25   Treatment Plan Summary: Daily contact with patient to assess and evaluate symptoms and progress in treatment, Medication management and Plan 65 year old man with bipolar disorder depressed severe with psychotic features. Receiving ECT treatment. Possibly having some improvement but it's a little bit forward and a little bit back. Today he seems much more withdrawn than he was last night. He still eating a little bit. He denies suicidal ideation but I have learned to take most of his information that he tells me with a gr of salt given previous experience. Observing his behavior is probably more important. Tried to encourage him to get up out of bed and be around other people be active get physically moving. No change to medicine for today. Next ECT treatment scheduled for Monday at this point. We will continue to follow up daily. Appreciated assistance from medicine on his anticoagulant We will discontinue the one-to-one observation. He is walking very stably with his walker and has not been engaging in any dangerous behavior. Plan reviewed with nursing who are in agreement [place] 12/26/2015, 5:36 PM

## 2015-12-26 NOTE — Transfer of Care (Signed)
Immediate Anesthesia Transfer of Care Note  Patient: Christian [name]  Procedure(s) Performed: ECT  Patient Location: PACU  Anesthesia Type:General  Level of Consciousness: sedated  Airway & Oxygen Therapy: Patient Spontanous Breathing and Patient connected to face mask oxygen  Post-op Assessment: Report given to RN  Post vital signs: Reviewed and stable  Last Vitals:  Filed Vitals:   12/26/15 0917 12/26/15 1241  BP: 133/85 118/91  Pulse: 95 105  Temp: 36.2 C 36.6 C  Resp: 18 20    Complications: No apparent anesthesia complications

## 2015-12-26 NOTE — Progress Notes (Signed)
Recreation Therapy Notes  Date: 01.27.17 Time: 1:00 pm Location: Craft Room  Group Topic: Communication, Problem solving, and Teamwork  Goal Area(s) Addresses:  Patient will work in teams towards shared goal. Patient will verbalize skills needed to make activity successful. Patient will verbalize benefit of using skills identified to reach post d/c. goals  Behavioral Response: Did not attend   Intervention: Landing Pad  Activity: Patients were given 15 straws and approximately 3 feet of tape to build a contraption to catch a golf ball that was dropped from about 4 feet.  Education: LRT educated patients on how to use these skills post d/c.  Education Outcome: Patient did not attend group.  Clinical Observations/Feedback: Patient did not attend group.  Jacquelynn Cree, LRT/CTRS 12/26/2015 3:04 PM

## 2015-12-26 NOTE — Anesthesia Postprocedure Evaluation (Signed)
Anesthesia Post Note  Patient: Christian [name]  Procedure(s) Performed: * No procedures listed *  Patient location during evaluation: PACU Anesthesia Type: General Level of consciousness: awake and alert Pain management: pain level controlled Vital Signs Assessment: post-procedure vital signs reviewed and stable Respiratory status: spontaneous breathing, nonlabored ventilation, respiratory function stable and patient connected to nasal cannula oxygen Cardiovascular status: blood pressure returned to baseline and stable Postop Assessment: no signs of nausea or vomiting Anesthetic complications: no    Last Vitals:  Filed Vitals:   12/26/15 1311 12/26/15 1316  BP: 129/82 129/82  Pulse: 96 96  Temp:  36.6 C  Resp: 19 16    Last Pain: There were no vitals filed for this visit.               Cleda Mccreedy Suleman Gunning

## 2015-12-26 NOTE — Progress Notes (Signed)
Pt returned to unit from ECT, assisted pt to bed, ordered pt lunch tray, pt resting in room at this time, no complaints, VSS, see post ECT assessment flowsheet.

## 2015-12-27 NOTE — Progress Notes (Signed)
D: Observed pt in room. Patient alert and oriented x4. Patient denies SI/HI/AVH. Pt affect is depressed. Pt guarded and forwards little. Pt stated "ECT really wiped me out" and indicated that he has been in his room all day. Pt indicated that he talked to his sister today.Pt more withdrawn this pm. Pt refusing colace. No other complaints. A: Offered active listening and support. Provided therapeutic communication. Administered scheduled medications. R: Pt pleasant and cooperative. Pt medication compliant, except for colace refusal. Will continue Q15 min. checks. Safety maintained.

## 2015-12-27 NOTE — Progress Notes (Signed)
Patient reports sleeping fair last pm. Describes appetite as poor, although eating 100% of meals. Endorses depression at 7, and anxiety at 5. Denies SI, HI, AVH. Goal today is to follow directions. Denies SI, HI, AVH. Encouragement and support offered. Pt receptive. Encouraged pt to attend groups, pt did attend group. Continues to present with sad, flat affect, offers minimal conversation. Pt remains safe on unit with q 15 min checks.

## 2015-12-27 NOTE — Plan of Care (Signed)
Problem: Ineffective individual coping Goal: STG: Patient will remain free from self harm Outcome: Progressing No self harm reported or observed     

## 2015-12-27 NOTE — Progress Notes (Signed)
[facility] MD Progress Note  12/27/2015 6:11 PM Christian [name]  MRN:  [id number] Subjective:  Patient with bipolar disorder with severe depression with psychotic features. Did not receive ECT today. Patient interviewed chart reviewed. Patient is very withdrawn staying in his bed almost motionless but awake. Not able to articulate why he is feeling so bad. Denies suicidal thoughts and denies hallucinations. Not very forthcoming however with what he is thinking. He will get up and move around with specific encouragement. This evening I saw I'm eating supper but only with his wife helping him. Are still running a little high. Principal Problem: Bipolar disorder, current episode depressed, severe, with psychotic features (HCC) Diagnosis:   Patient Active Problem List   Diagnosis Date Noted  . Bipolar I disorder, most recent episode depressed, severe w psychosis (HCC) [F31.5] 12/22/2015  . Bipolar affective disorder, depressed, severe, with psychotic behavior (HCC) [F31.5]   . Bipolar I disorder, current or most recent episode depressed, with psychotic features (HCC) [F31.5]   . Bipolar I disorder, most recent episode depressed (HCC) [F31.30]   . Severe bipolar I disorder with depression (HCC) [F31.4]   . Bipolar I disorder, most recent episode depressed, severe with psychotic features (HCC) [F31.5]   . Overdose [T50.901A] 09/11/2015  . Acute respiratory failure with hypoxia (HCC) [J96.01] 09/05/2015  . Elevated troponin [R79.89] 09/05/2015  . Somnolence [R40.0] 09/05/2015  . Bipolar depression (HCC) [F31.30] 09/05/2015  . Acute bilateral deep vein thrombosis (DVT) of femoral veins (HCC) [I82.413] 09/05/2015  . Bilateral pulmonary embolism (HCC) [I26.99] 09/02/2015  . Labile hypertension [I10]   . Pulmonary embolism with acute cor pulmonale (HCC) [I26.09]   . Dyspnea [R06.00]   . Orthostatic hypotension [I95.1]   . Bipolar disorder, in partial remission, most recent episode depressed (HCC) [F31.75]    . Syncope, near [R55] 08/31/2015  . Affective psychosis, bipolar (HCC) [F31.9]   . Bipolar disorder, now depressed (HCC) [F31.30] 08/28/2015  . Bipolar I disorder, severe, current or most recent episode depressed, with psychotic features (HCC) [F31.5]   . Pressure ulcer [L89.90] 07/30/2015  . Protein-calorie malnutrition, severe (HCC) [E43] 07/30/2015  . Dehydration [E86.0] 07/29/2015  . Bipolar disorder, current episode depressed, severe, with psychotic features (HCC) [F31.5] 07/09/2015  . Diabetes (HCC) [E11.9] 07/09/2015  . UTI (urinary tract infection) [N39.0] 06/19/2014  . Positive blood culture [R78.81] 06/19/2014  . Bipolar affective disorder, current episode manic with psychotic symptoms (HCC) [F31.2] 06/17/2014  . Type II or unspecified type diabetes mellitus with unspecified complication, uncontrolled [E11.8, E11.65] 06/02/2014  . Other and unspecified hyperlipidemia [E78.5] 06/02/2014  . OSA on CPAP [G47.33] 06/02/2014  . 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 Gout [M10.9] 06/02/2014  . Hypertension [I10]    Total Time spent with patient: 30 minutes  Past Psychiatric History: Patient has a long history of bipolar disorder with both depressed and manic phases in the past a history of suicide attempts and now a history of aggression towards his wife. Has responded well to ECT but is very fragile and his illness with a tendency to relapse quickly.  Past Medical History:  Past Medical History  Diagnosis Date  . Mood swings (HCC)   . Type II or unspecified type diabetes mellitus with unspecified complication, uncontrolled 06/02/2014  . Other and unspecified hyperlipidemia 06/02/2014  . OSA on CPAP 06/02/2014  . 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 Bipolar disorder (HCC)   . Hypertension   .  Gout   . Bipolar 1 disorder (HCC)   . Hx of blood clots    History reviewed. No pertinent past  surgical history. Family History:  Family History  Problem Relation Age of Onset  . Dementia Mother   . Arthritis Father   . Hypertension Father   . Heart failure Neg Hx   . Kidney failure Neg Hx   . Cancer Neg Hx   . Mental illness Sister   . Diabetes Sister    Family Psychiatric  History: Positive family history for anxiety and bipolar disorder Social History:  History  Alcohol Use No     History  Drug Use No    Social History   Social History  . Marital Status: Married    Spouse Name: N/A  . Number of Children: N/A  . Years of Education: N/A   Social History Main Topics  . Smoking status: Never Smoker   . Smokeless tobacco: None  . Alcohol Use: No  . Drug Use: No  . Sexual Activity: Not Currently   Other Topics Concern  . None   Social History Narrative   *The patient has been married for over 30 years and is currently on disability. He lives with his wife in the [place] area and has one daughter.             Additional Social History:                         Sleep: Fair  Appetite:  Fair  Current Medications: Current Facility-Administered Medications  Medication Dose Route Frequency Provider Last Rate Last Dose  . acetaminophen (TYLENOL) tablet 650 mg  650 mg Oral Q6H PRN Audery Amel, MD   650 mg at 12/23/15 2315  . allopurinol (ZYLOPRIM) tablet 300 mg  300 mg Oral Daily Audery Amel, MD   300 mg at 12/27/15 0815  . alum & mag hydroxide-simeth (MAALOX/MYLANTA) 200-200-20 MG/5ML suspension 30 mL  30 mL Oral Q4H PRN Audery Amel, MD      . apixaban (ELIQUIS) tablet 5 mg  5 mg Oral BID Audery Amel, MD   5 mg at 12/27/15 0816  . aspirin EC tablet 81 mg  81 mg Oral Daily Audery Amel, MD   81 mg at 12/27/15 0815  . atorvastatin (LIPITOR) tablet 10 mg  10 mg Oral q1800 Audery Amel, MD   10 mg at 12/27/15 1623  . docusate sodium (COLACE) capsule 100 mg  100 mg Oral BID Audery Amel, MD   100 mg at 12/27/15 0815  . finasteride  (PROSCAR) tablet 5 mg  5 mg Oral Daily Audery Amel, MD   5 mg at 12/27/15 0815  . furosemide (LASIX) tablet 20 mg  20 mg Oral Daily Audery Amel, MD   20 mg at 12/27/15 0816  . labetalol (NORMODYNE,TRANDATE) injection 20 mg  20 mg Intravenous Once Audery Amel, MD   20 mg at 12/24/15 1348  . lisinopril (PRINIVIL,ZESTRIL) tablet 40 mg  40 mg Oral Daily Jolanta B Pucilowska, MD   40 mg at 12/27/15 0815  . magnesium hydroxide (MILK OF MAGNESIA) suspension 30 mL  30 mL Oral Daily PRN Audery Amel, MD      . metFORMIN (GLUCOPHAGE) tablet 500 mg  500 mg Oral BID WC Audery Amel, MD   500 mg at 12/27/15 1623  . mirtazapine (REMERON) tablet 45 mg  45 mg  Oral QHS Audery Amel, MD   45 mg at 12/26/15 2212  . OLANZapine (ZYPREXA) tablet 20 mg  20 mg Oral QHS Audery Amel, MD   20 mg at 12/26/15 2212  . polyethylene glycol (MIRALAX / GLYCOLAX) packet 17 g  17 g Oral Daily Audery Amel, MD   17 g at 12/27/15 0816  . tamsulosin (FLOMAX) capsule 0.4 mg  0.4 mg Oral QPC supper Audery Amel, MD   0.4 mg at 12/27/15 1623    Lab Results:  No results found for this or any previous visit (from the past 48 hour(s)).  Physical Findings: AIMS: Facial and Oral Movements Muscles of Facial Expression: None, normal Lips and Perioral Area: None, normal Jaw: None, normal Tongue: None, normal,Extremity Movements Upper (arms, wrists, hands, fingers): None, normal Lower (legs, knees, ankles, toes): None, normal, Trunk Movements Neck, shoulders, hips: None, normal, Overall Severity Severity of abnormal movements (highest score from questions above): None, normal Incapacitation due to abnormal movements: None, normal Patient's awareness of abnormal movements (rate only patient's report): No Awareness, Dental Status Current problems with teeth and/or dentures?: No Does patient usually wear dentures?: No  CIWA:    COWS:     Musculoskeletal: Strength & Muscle Tone: within normal limits Gait & Station:  unsteady Patient leans: N/A  Psychiatric Specialty Exam: Review of Systems  Constitutional: Negative.   HENT: Negative.   Eyes: Negative.   Respiratory: Negative.   Cardiovascular: Negative.   Gastrointestinal: Negative.   Musculoskeletal: Negative.   Skin: Negative.   Neurological: Negative.   Psychiatric/Behavioral: Positive for memory loss. Negative for depression, suicidal ideas, hallucinations and substance abuse. The patient has insomnia. The patient is not nervous/anxious.     Blood pressure 143/87, pulse 75, temperature 98.9 F (37.2 C), temperature source Oral, resp. rate 20, height 6' (1.829 m), weight 104.327 kg (230 lb), SpO2 98 %.Body mass index is 31.19 kg/(m^2).  General Appearance: Disheveled  Eye Solicitor::  Fair  Speech:  Slow  Volume:  Decreased  Mood:  Dysphoric  Affect:  Flat  Thought Process:  Goal Directed  Orientation:  Full (Time, Place, and Person)  Thought Content:  Rumination  Suicidal Thoughts:  No  Homicidal Thoughts:  No  Memory:  Immediate;   Fair Recent;   Fair Remote;   Fair  Judgement:  Impaired  Insight:  Fair  Psychomotor Activity:  Decreased  Concentration:  Fair  Recall:  [employer] of Knowledge:Fair  Language: Fair  Akathisia:  No  Handed:  Right  AIMS (if indicated):     Assets:  Desire for Improvement Financial Resources/Insurance Housing Resilience Social Support  ADL's:  Impaired  Cognition: Impaired,  Mild  Sleep:  Number of Hours: 6.75   Treatment Plan Summary: Daily contact with patient to assess and evaluate symptoms and progress in treatment, Medication management and Plan Patient is receiving ECT as a primary treatment but is also on antidepressants and antipsychotics. Still seems to be very withdrawn. Denies suicidal and homicidal ideation but certainly is not functional. Gets up and walks only with some encouragement. Spent some time talking with him about the importance of trying to get more active. ECT next  scheduled for Monday. No change to medicine for today reevaluate as the days go by. I will request a lipid panel now as I believe that was not done initially. We will discontinue the one-to-one observation. He is walking very stably with his walker and has not been engaging in any  dangerous behavior. Plan reviewed with nursing who are in agreement [name] 12/27/2015, 6:11 PM

## 2015-12-27 NOTE — Plan of Care (Signed)
Problem: Diagnosis: Increased Risk For Suicide Attempt Goal: STG-Patient Will Comply With Medication Regime Outcome: Progressing Pt complying with medication regimen.     

## 2015-12-27 NOTE — [facility] Group Notes (Signed)
[facility] Group Notes:  (Nursing/MHT/Case Management/Adjunct)  Date:  12/27/2015  Time:  12:07 PM  Type of Therapy:  Group Therapy  Participation Level:  None  Participation Quality:  Inattentive  Affect:  Flat  Cognitive:  Disorganized  Insight:  Lacking  Engagement in Group:  None  Modes of Intervention:  Discussion and Problem-solving  Summary of Progress/Problems:  Christian [name] 12/27/2015, 12:07 PM

## 2015-12-27 NOTE — [facility] Group Notes (Signed)
[facility] LCSW Group Therapy  12/27/2015 2:23 PM  Type of Therapy:  Group Therapy  Participation Level:  Minimal  Participation Quality:  Attentive  Affect:  Flat  Cognitive:  Alert  Insight:  Limited  Engagement in Therapy:  Limited  Modes of Intervention:  Discussion, Education, Socialization and Support  Summary of Progress/Problems: Todays topic: Grudges  Patients will be encouraged to discuss their thoughts, feelings, and behaviors as to why one holds on to grudges and reasons why people have grudges. Patients will process the impact of grudges on their daily lives and identify thoughts and feelings related to holding grudges. Patients will identify feelings and thoughts related to what life would look like without grudges. Pt attended group and stayed the entire time. He sat quietly and listened to other group members.   [employer] MSW, LCSWA  12/27/2015, 2:23 PM

## 2015-12-28 LAB — CBC
HCT: 43.7 % (ref 40.0–52.0)
Hemoglobin: 14.4 g/dL (ref 13.0–18.0)
MCH: 28.9 pg (ref 26.0–34.0)
MCHC: 33.1 g/dL (ref 32.0–36.0)
MCV: 87.5 fL (ref 80.0–100.0)
Platelets: 178 10*3/uL (ref 150–440)
RBC: 4.99 MIL/uL (ref 4.40–5.90)
RDW: 12.9 % (ref 11.5–14.5)
WBC: 3.9 10*3/uL (ref 3.8–10.6)

## 2015-12-28 LAB — LIPID PANEL
Cholesterol: 108 mg/dL (ref 0–200)
HDL: 33 mg/dL — ABNORMAL LOW (ref >40)
LDL Cholesterol: 63 mg/dL (ref 0–99)
Total CHOL/HDL Ratio: 3.3 RATIO
Triglycerides: 62 mg/dL (ref <150)
VLDL: 12 mg/dL (ref 0–40)

## 2015-12-28 LAB — CREATININE, SERUM
Creatinine, Ser: 1.6 mg/dL — ABNORMAL HIGH (ref 0.61–1.24)
GFR calc Af Amer: 52 mL/min — ABNORMAL LOW (ref >60)
GFR calc non Af Amer: 45 mL/min — ABNORMAL LOW (ref >60)

## 2015-12-28 NOTE — Progress Notes (Addendum)
Pt has been pleasant and cooperative. Pt denies SI and A/V hallucinations. Pt is able to contract for safety. Pt did attend some unit activities. Pt ambulating well on the unit via walker. Pt's mood and affect has been depressed.

## 2015-12-28 NOTE — [facility] Group Notes (Signed)
[facility] LCSW Group Therapy  12/28/2015 3:59 PM  Type of Therapy:  Group Therapy  Participation Level:  Did Not Attend  Modes of Intervention:  Discussion, Education, Socialization and Support  Summary of Progress/Problems: Mindfulness: Patient discussed mindfulness and relaxing techniques and why they are beneficial. Pt discussed ways to incorporate mindfulness in their lives. Pt practiced a mindfulness techique and discussed how it made them feel.   Aubert Choyce L Reggie Welge MSW, LCSWA  12/28/2015, 3:59 PM  

## 2015-12-28 NOTE — Progress Notes (Signed)
Pt in room upon approach. Affect is depressed and Flat. Pt mostly isolated to room, however did go out for snack. Forwards little and interaction minimal. Denies SI and pain.  Q15 minute checks maintained for safety. Encouragement and support provided. Medications given as prescribed. Pt calm and cooperative, remains guarded with depressed affect. Voiced no concerns or complaints. Will continue to monitor.

## 2015-12-28 NOTE — Progress Notes (Signed)
[facility] MD Progress Note  12/28/2015 3:17 PM Christian [name]  MRN:  [id number] Subjective: Patient continues to be very withdrawn today. His eyes were open and he communicated slightly with me but didn't say more than about 3 or 4 words. Wouldn't answer when I ask him what was on his mind. Just staring at the wall. He does get up and walk and has been eating some. He won't articulate any other complaints. Unclear exactly what's going on in his mind or whether he is having psychotic symptoms. Wife has visited a few times.. Principal Problem: Bipolar disorder, current episode depressed, severe, with psychotic features ([place]) Diagnosis:   Patient Active Problem List   Diagnosis Date Noted  . Bipolar I disorder, most recent episode depressed, severe w psychosis ([place]) [F31.5] 12/22/2015  . Bipolar affective disorder, depressed, severe, with psychotic behavior ([place]) [F31.5]   . Bipolar I disorder, current or most recent episode depressed, with psychotic features ([place]) [F31.5]   . Bipolar I disorder, most recent episode depressed ([place]) [F31.30]   . Severe bipolar I disorder with depression ([place]) [F31.4]   . Bipolar I disorder, most recent episode depressed, severe with psychotic features ([place]) [F31.5]   . Overdose [T50.901A] 09/11/2015  . Acute respiratory failure with hypoxia ([place]) [J96.01] 09/05/2015  . Elevated troponin [R79.89] 09/05/2015  . Somnolence [R40.0] 09/05/2015  . Bipolar depression ([place]) [F31.30] 09/05/2015  . Acute bilateral deep vein thrombosis (DVT) of femoral veins (HCC) [I82.413] 09/05/2015  . Bilateral pulmonary embolism ([place]) [I26.99] 09/02/2015  . Labile hypertension [I10]   . Pulmonary embolism with acute cor pulmonale ([place]) [I26.09]   . Dyspnea [R06.00]   . Orthostatic hypotension [I95.1]   . Bipolar disorder, in partial remission, most recent episode depressed ([place]) [F31.75]   . Syncope, near [R55] 08/31/2015  . Affective psychosis, bipolar ([place]) [F31.9]   . Bipolar  disorder, now depressed ([place]) [F31.30] 08/28/2015  . Bipolar I disorder, severe, current or most recent episode depressed, with psychotic features ([place]) [F31.5]   . Pressure ulcer [L89.90] 07/30/2015  . Protein-calorie malnutrition, severe ([place]) [E43] 07/30/2015  . Dehydration [E86.0] 07/29/2015  . Bipolar disorder, current episode depressed, severe, with psychotic features ([place]) [F31.5] 07/09/2015  . Diabetes ([place]) [E11.9] 07/09/2015  . UTI (urinary tract infection) [N39.0] 06/19/2014  . Positive blood culture [R78.81] 06/19/2014  . Bipolar affective disorder, current episode manic with psychotic symptoms ([place]) [F31.2] 06/17/2014  . Type II or unspecified type diabetes mellitus with unspecified complication, uncontrolled [E11.8, E11.65] 06/02/2014  . Other and unspecified hyperlipidemia [E78.5] 06/02/2014  . OSA on CPAP [G47.33] 06/02/2014  . 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 Gout [M10.9] 06/02/2014  . Hypertension [I10]    Total Time spent with patient: 30 minutes  Past Psychiatric History: Patient has a long history of bipolar disorder with both depressed and manic phases in the past a history of suicide attempts and now a history of aggression towards his wife. Has responded well to ECT but is very fragile and his illness with a tendency to relapse quickly.  Past Medical History:  Past Medical History  Diagnosis Date  . Mood swings ([place])   . Type II or unspecified type diabetes mellitus with unspecified complication, uncontrolled 06/02/2014  . Other and unspecified hyperlipidemia 06/02/2014  . OSA on CPAP 06/02/2014  . 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 Bipolar disorder ([place])   . Hypertension   . Gout   .  Bipolar 1 disorder ([place])   . Hx of blood clots    History reviewed. No pertinent past surgical history. Family History:  Family History  Problem Relation Age of Onset  . Dementia  Mother   . Arthritis Father   . Hypertension Father   . Heart failure Neg Hx   . Kidney failure Neg Hx   . Cancer Neg Hx   . Mental illness Sister   . Diabetes Sister    Family Psychiatric  History: Positive family history for anxiety and bipolar disorder Social History:  History  Alcohol Use No     History  Drug Use No    Social History   Social History  . Marital Status: Married    Spouse Name: N/A  . Number of Children: N/A  . Years of Education: N/A   Social History Main Topics  . Smoking status: Never Smoker   . Smokeless tobacco: None  . Alcohol Use: No  . Drug Use: No  . Sexual Activity: Not Currently   Other Topics Concern  . None   Social History Narrative   *The patient has been married for over 30 years and is currently on disability. He lives with his wife in the [place] area and has one daughter.             Additional Social History:                         Sleep: Fair  Appetite:  Fair  Current Medications: Current Facility-Administered Medications  Medication Dose Route Frequency Provider Last Rate Last Dose  . acetaminophen (TYLENOL) tablet 650 mg  650 mg Oral Q6H PRN Gonzella Lex, MD   650 mg at 12/23/15 2315  . allopurinol (ZYLOPRIM) tablet 300 mg  300 mg Oral Daily Gonzella Lex, MD   300 mg at 12/28/15 0924  . alum & mag hydroxide-simeth (MAALOX/MYLANTA) 200-200-20 MG/5ML suspension 30 mL  30 mL Oral Q4H PRN Gonzella Lex, MD      . apixaban (ELIQUIS) tablet 5 mg  5 mg Oral BID Gonzella Lex, MD   5 mg at 12/28/15 0924  . aspirin EC tablet 81 mg  81 mg Oral Daily Gonzella Lex, MD   81 mg at 12/28/15 4967  . atorvastatin (LIPITOR) tablet 10 mg  10 mg Oral q1800 Gonzella Lex, MD   10 mg at 12/27/15 1623  . docusate sodium (COLACE) capsule 100 mg  100 mg Oral BID Gonzella Lex, MD   100 mg at 12/28/15 0924  . finasteride (PROSCAR) tablet 5 mg  5 mg Oral Daily Gonzella Lex, MD   5 mg at 12/28/15 0925  . furosemide  (LASIX) tablet 20 mg  20 mg Oral Daily Gonzella Lex, MD   20 mg at 12/28/15 0924  . labetalol (NORMODYNE,TRANDATE) injection 20 mg  20 mg Intravenous Once Gonzella Lex, MD   20 mg at 12/24/15 1348  . lisinopril (PRINIVIL,ZESTRIL) tablet 40 mg  40 mg Oral Daily Clovis Fredrickson, MD   40 mg at 12/28/15 0924  . magnesium hydroxide (MILK OF MAGNESIA) suspension 30 mL  30 mL Oral Daily PRN Gonzella Lex, MD      . metFORMIN (GLUCOPHAGE) tablet 500 mg  500 mg Oral BID WC Gonzella Lex, MD   500 mg at 12/28/15 0925  . mirtazapine (REMERON) tablet 45 mg  45 mg Oral QHS John T  Clapacs, MD   45 mg at 12/27/15 2119  . OLANZapine (ZYPREXA) tablet 20 mg  20 mg Oral QHS Gonzella Lex, MD   20 mg at 12/27/15 2118  . polyethylene glycol (MIRALAX / GLYCOLAX) packet 17 g  17 g Oral Daily Gonzella Lex, MD   17 g at 12/28/15 0923  . tamsulosin (FLOMAX) capsule 0.4 mg  0.4 mg Oral QPC supper Gonzella Lex, MD   0.4 mg at 12/27/15 1623    Lab Results:  Results for orders placed or performed during the hospital encounter of 12/22/15 (from the past 48 hour(s))  Creatinine, serum     Status: Abnormal   Collection Time: 12/28/15  7:43 AM  Result Value Ref Range   Creatinine, Ser 1.60 (H) 0.61 - 1.24 mg/dL   GFR calc non Af Amer 45 (L) >60 mL/min   GFR calc Af Amer 52 (L) >60 mL/min    Comment: (NOTE) The eGFR has been calculated using the CKD EPI equation. This calculation has not been validated in all clinical situations. eGFR's persistently <60 mL/min signify possible Chronic Kidney Disease.   CBC     Status: None   Collection Time: 12/28/15  7:43 AM  Result Value Ref Range   WBC 3.9 3.8 - 10.6 K/uL   RBC 4.99 4.40 - 5.90 MIL/uL   Hemoglobin 14.4 13.0 - 18.0 g/dL   HCT 43.7 40.0 - 52.0 %   MCV 87.5 80.0 - 100.0 fL   MCH 28.9 26.0 - 34.0 pg   MCHC 33.1 32.0 - 36.0 g/dL   RDW 12.9 11.5 - 14.5 %   Platelets 178 150 - 440 K/uL  Lipid panel     Status: Abnormal   Collection Time: 12/28/15   7:43 AM  Result Value Ref Range   Cholesterol 108 0 - 200 mg/dL   Triglycerides 62 <150 mg/dL   HDL 33 (L) >40 mg/dL   Total CHOL/HDL Ratio 3.3 RATIO   VLDL 12 0 - 40 mg/dL   LDL Cholesterol 63 0 - 99 mg/dL    Comment:        Total Cholesterol/HDL:CHD Risk Coronary Heart Disease Risk Table                     Men   Women  1/2 Average Risk   3.4   3.3  Average Risk       5.0   4.4  2 X Average Risk   9.6   7.1  3 X Average Risk  23.4   11.0        Use the calculated Patient Ratio above and the CHD Risk Table to determine the patient's CHD Risk.        ATP III CLASSIFICATION (LDL):  <100     mg/dL   Optimal  100-129  mg/dL   Near or Above                    Optimal  130-159  mg/dL   Borderline  160-189  mg/dL   High  >190     mg/dL   Very High     Physical Findings: AIMS: Facial and Oral Movements Muscles of Facial Expression: None, normal Lips and Perioral Area: None, normal Jaw: None, normal Tongue: None, normal,Extremity Movements Upper (arms, wrists, hands, fingers): None, normal Lower (legs, knees, ankles, toes): None, normal, Trunk Movements Neck, shoulders, hips: None, normal, Overall Severity Severity of abnormal movements (highest score from  questions above): None, normal Incapacitation due to abnormal movements: None, normal Patient's awareness of abnormal movements (rate only patient's report): No Awareness, Dental Status Current problems with teeth and/or dentures?: No Does patient usually wear dentures?: No  CIWA:    COWS:     Musculoskeletal: Strength & Muscle Tone: within normal limits Gait & Station: unsteady Patient leans: N/A  Psychiatric Specialty Exam: Review of Systems  Constitutional: Positive for malaise/fatigue.  HENT: Negative.   Eyes: Negative.   Respiratory: Negative.   Cardiovascular: Negative.   Gastrointestinal: Negative.   Musculoskeletal: Negative.   Skin: Negative.   Neurological: Negative.   Psychiatric/Behavioral:  Positive for depression and memory loss. Negative for suicidal ideas, hallucinations and substance abuse. The patient is nervous/anxious and has insomnia.     Blood pressure 122/75, pulse 103, temperature 98.2 F (36.8 C), temperature source Oral, resp. rate 20, height 6' (1.829 m), weight 104.327 kg (230 lb), SpO2 98 %.Body mass index is 31.19 kg/(m^2).  General Appearance: Disheveled  Eye Sport and exercise psychologist::  Fair  Speech:  Slow  Volume:  Decreased  Mood:  Dysphoric  Affect:  Flat  Thought Process:  Goal Directed  Orientation:  Full (Time, Place, and Person)  Thought Content:  Rumination  Suicidal Thoughts:  No  Homicidal Thoughts:  No  Memory:  Immediate;   Fair Recent;   Fair Remote;   Fair  Judgement:  Impaired  Insight:  Fair  Psychomotor Activity:  Decreased  Concentration:  Fair  Recall:  [employer] of Knowledge:Fair  Language: Fair  Akathisia:  No  Handed:  Right  AIMS (if indicated):     Assets:  Desire for Improvement Financial Resources/Insurance Housing Resilience Social Support  ADL's:  Impaired  Cognition: Impaired,  Mild  Sleep:  Number of Hours: 5   Treatment Plan Summary: Daily contact with patient to assess and evaluate symptoms and progress in treatment, Medication management and Plan Patient is scheduled for ECT tomorrow. His affect and mood are still very down and withdrawn. If anything seems to be getting worse. Counseled patient he needs to get up and get active and be physical and he well. He is not communicating well with me right now. I'm hoping that further ECT will get some improvement. We will discontinue the one-to-one observation. He is walking very stably with his walker and has not been engaging in any dangerous behavior. Plan reviewed with nursing who are in agreement [place] 12/28/2015, 3:17 PM

## 2015-12-29 ENCOUNTER — Inpatient Hospital Stay: Payer: Commercial Managed Care - HMO

## 2015-12-29 ENCOUNTER — Encounter: Payer: Self-pay | Admitting: *Deleted

## 2015-12-29 ENCOUNTER — Inpatient Hospital Stay: Payer: Commercial Managed Care - HMO | Admitting: Anesthesiology

## 2015-12-29 LAB — GLUCOSE, CAPILLARY: Glucose-Capillary: 205 mg/dL — ABNORMAL HIGH (ref 65–99)

## 2015-12-29 MED ORDER — SODIUM CHLORIDE 0.9 % IV SOLN
250.0000 mL | Freq: Once | INTRAVENOUS | Status: AC
  Administered 2015-12-29: 500 mL via INTRAVENOUS

## 2015-12-29 MED ORDER — LABETALOL HCL 5 MG/ML IV SOLN
INTRAVENOUS | Status: DC | PRN
  Administered 2015-12-29: 20 mg via INTRAVENOUS

## 2015-12-29 MED ORDER — KETAMINE HCL 10 MG/ML IJ SOLN
INTRAMUSCULAR | Status: DC | PRN
  Administered 2015-12-29: 120 mg via INTRAVENOUS

## 2015-12-29 MED ORDER — LABETALOL HCL 5 MG/ML IV SOLN
10.0000 mg | Freq: Once | INTRAVENOUS | Status: AC
  Administered 2015-12-29: 10 mg via INTRAVENOUS

## 2015-12-29 MED ORDER — SUCCINYLCHOLINE CHLORIDE 20 MG/ML IJ SOLN
INTRAMUSCULAR | Status: DC | PRN
  Administered 2015-12-29: 150 mg via INTRAVENOUS

## 2015-12-29 NOTE — Procedures (Signed)
ECT SERVICES Physician's Interval Evaluation & Treatment Note  Patient Identification: [name] MRN:  [id number] Date of Evaluation:  12/29/2015 TX #: [id number]  MADRS:   MMSE:   P.E. Findings:  Physically no different vital signs stable  Psychiatric Interval Note:  Mood is down and depressed flat withdrawn  Subjective:  Patient is a 65 y.o. male seen for evaluation for Electroconvulsive Therapy. No specific complaint minimal communication  Treatment Summary:   []   Right Unilateral             [x]  Bilateral   % Energy : 1.0 ms 100%   Impedance: 890 ohms  Seizure Energy Index: 2476 V squared  Postictal Suppression Index: 63%  Seizure Concordance Index: 98%  Medications  Pre Shock: Labetalol 20 mg, ketamine 120 mg, succinylcholine 150 mg  Post Shock:    Seizure Duration: 40 seconds by EMG, 72 seconds by EEG   Comments: Follow-up Wednesday and Friday   Lungs:  [x]   Clear to auscultation               []  Other:   Heart:    [x]   Regular rhythm             []  irregular rhythm    [x]   Previous H&P reviewed, patient examined and there are NO CHANGES                 []   Previous H&P reviewed, patient examined and there are changes noted.   [name], MD 1/30/201712:23 PM

## 2015-12-29 NOTE — Progress Notes (Signed)
[facility] MD Progress Note  12/29/2015 7:58 PM Christian [name]  MRN:  [id number] Subjective: Follow-up for this 65 year old man with bipolar disorder severe depressed with psychotic features. Patient had ECT this morning which was tolerated well. He remains withdrawn and doesn't talk very much doesn't interact very much. Denies any active suicidal thought. Still appears to be very depressed.  Principal Problem: Bipolar disorder, current episode depressed, severe, with psychotic features ([place]) Diagnosis:   Patient Active Problem List   Diagnosis Date Noted  . Bipolar I disorder, most recent episode depressed, severe w psychosis ([place]) [F31.5] 12/22/2015  . Bipolar affective disorder, depressed, severe, with psychotic behavior ([place]) [F31.5]   . Bipolar I disorder, current or most recent episode depressed, with psychotic features ([place]) [F31.5]   . Bipolar I disorder, most recent episode depressed ([place]) [F31.30]   . Severe bipolar I disorder with depression ([place]) [F31.4]   . Bipolar I disorder, most recent episode depressed, severe with psychotic features ([place]) [F31.5]   . Overdose [T50.901A] 09/11/2015  . Acute respiratory failure with hypoxia ([place]) [J96.01] 09/05/2015  . Elevated troponin [R79.89] 09/05/2015  . Somnolence [R40.0] 09/05/2015  . Bipolar depression ([place]) [F31.30] 09/05/2015  . Acute bilateral deep vein thrombosis (DVT) of femoral veins (HCC) [I82.413] 09/05/2015  . Bilateral pulmonary embolism ([place]) [I26.99] 09/02/2015  . Labile hypertension [I10]   . Pulmonary embolism with acute cor pulmonale ([place]) [I26.09]   . Dyspnea [R06.00]   . Orthostatic hypotension [I95.1]   . Bipolar disorder, in partial remission, most recent episode depressed ([place]) [F31.75]   . Syncope, near [R55] 08/31/2015  . Affective psychosis, bipolar ([place]) [F31.9]   . Bipolar disorder, now depressed ([place]) [F31.30] 08/28/2015  . Bipolar I disorder, severe, current or most recent episode depressed, with psychotic  features ([place]) [F31.5]   . Pressure ulcer [L89.90] 07/30/2015  . Protein-calorie malnutrition, severe ([place]) [E43] 07/30/2015  . Dehydration [E86.0] 07/29/2015  . Bipolar disorder, current episode depressed, severe, with psychotic features ([place]) [F31.5] 07/09/2015  . Diabetes ([place]) [E11.9] 07/09/2015  . UTI (urinary tract infection) [N39.0] 06/19/2014  . Positive blood culture [R78.81] 06/19/2014  . Bipolar affective disorder, current episode manic with psychotic symptoms ([place]) [F31.2] 06/17/2014  . Type II or unspecified type diabetes mellitus with unspecified complication, uncontrolled [E11.8, E11.65] 06/02/2014  . Other and unspecified hyperlipidemia [E78.5] 06/02/2014  . OSA on CPAP [G47.33] 06/02/2014  . 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 Gout [M10.9] 06/02/2014  . Hypertension [I10]    Total Time spent with patient: 30 minutes  Past Psychiatric History: Patient has a long history of bipolar disorder with both depressed and manic phases in the past a history of suicide attempts and now a history of aggression towards his wife. Has responded well to ECT but is very fragile and his illness with a tendency to relapse quickly.  Past Medical History:  Past Medical History  Diagnosis Date  . Mood swings ([place])   . Type II or unspecified type diabetes mellitus with unspecified complication, uncontrolled 06/02/2014  . Other and unspecified hyperlipidemia 06/02/2014  . OSA on CPAP 06/02/2014  . 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 Bipolar disorder ([place])   . Hypertension   . Gout   . Bipolar 1 disorder ([place])   . Hx of blood clots    History reviewed. No pertinent past surgical history. Family History:  Family History  Problem Relation Age of Onset  . Dementia  Mother   . Arthritis Father   . Hypertension Father   . Heart failure Neg Hx   . Kidney failure Neg Hx   . Cancer Neg Hx   . Mental  illness Sister   . Diabetes Sister    Family Psychiatric  History: Positive family history for anxiety and bipolar disorder Social History:  History  Alcohol Use No     History  Drug Use No    Social History   Social History  . Marital Status: Married    Spouse Name: N/A  . Number of Children: N/A  . Years of Education: N/A   Social History Main Topics  . Smoking status: Never Smoker   . Smokeless tobacco: None  . Alcohol Use: No  . Drug Use: No  . Sexual Activity: Not Currently   Other Topics Concern  . None   Social History Narrative   *The patient has been married for over 30 years and is currently on disability. He lives with his wife in the [place] area and has one daughter.             Additional Social History:                         Sleep: Fair  Appetite:  Fair  Current Medications: Current Facility-Administered Medications  Medication Dose Route Frequency Provider Last Rate Last Dose  . acetaminophen (TYLENOL) tablet 650 mg  650 mg Oral Q6H PRN Gonzella Lex, MD   650 mg at 12/23/15 2315  . allopurinol (ZYLOPRIM) tablet 300 mg  300 mg Oral Daily Gonzella Lex, MD   300 mg at 12/29/15 1531  . alum & mag hydroxide-simeth (MAALOX/MYLANTA) 200-200-20 MG/5ML suspension 30 mL  30 mL Oral Q4H PRN Gonzella Lex, MD      . apixaban (ELIQUIS) tablet 5 mg  5 mg Oral BID Gonzella Lex, MD   5 mg at 12/29/15 1531  . aspirin EC tablet 81 mg  81 mg Oral Daily Gonzella Lex, MD   81 mg at 12/29/15 1532  . atorvastatin (LIPITOR) tablet 10 mg  10 mg Oral q1800 Gonzella Lex, MD   10 mg at 12/29/15 1825  . docusate sodium (COLACE) capsule 100 mg  100 mg Oral BID Gonzella Lex, MD   100 mg at 12/29/15 1532  . finasteride (PROSCAR) tablet 5 mg  5 mg Oral Daily Gonzella Lex, MD   5 mg at 12/29/15 1531  . furosemide (LASIX) tablet 20 mg  20 mg Oral Daily Gonzella Lex, MD   20 mg at 12/29/15 1532  . labetalol (NORMODYNE,TRANDATE) injection 20 mg  20 mg  Intravenous Once Gonzella Lex, MD   20 mg at 12/24/15 1348  . lisinopril (PRINIVIL,ZESTRIL) tablet 40 mg  40 mg Oral Daily Clovis Fredrickson, MD   40 mg at 12/29/15 0839  . magnesium hydroxide (MILK OF MAGNESIA) suspension 30 mL  30 mL Oral Daily PRN Gonzella Lex, MD      . metFORMIN (GLUCOPHAGE) tablet 500 mg  500 mg Oral BID WC Gonzella Lex, MD   500 mg at 12/29/15 1532  . mirtazapine (REMERON) tablet 45 mg  45 mg Oral QHS Gonzella Lex, MD   45 mg at 12/28/15 2106  . OLANZapine (ZYPREXA) tablet 20 mg  20 mg Oral QHS Gonzella Lex, MD   20 mg at 12/28/15 2106  . polyethylene  glycol (MIRALAX / GLYCOLAX) packet 17 g  17 g Oral Daily Gonzella Lex, MD   17 g at 12/29/15 1532  . tamsulosin (FLOMAX) capsule 0.4 mg  0.4 mg Oral QPC supper Gonzella Lex, MD   0.4 mg at 12/29/15 1825    Lab Results:  Results for orders placed or performed during the hospital encounter of 12/22/15 (from the past 48 hour(s))  Creatinine, serum     Status: Abnormal   Collection Time: 12/28/15  7:43 AM  Result Value Ref Range   Creatinine, Ser 1.60 (H) 0.61 - 1.24 mg/dL   GFR calc non Af Amer 45 (L) >60 mL/min   GFR calc Af Amer 52 (L) >60 mL/min    Comment: (NOTE) The eGFR has been calculated using the CKD EPI equation. This calculation has not been validated in all clinical situations. eGFR's persistently <60 mL/min signify possible Chronic Kidney Disease.   CBC     Status: None   Collection Time: 12/28/15  7:43 AM  Result Value Ref Range   WBC 3.9 3.8 - 10.6 K/uL   RBC 4.99 4.40 - 5.90 MIL/uL   Hemoglobin 14.4 13.0 - 18.0 g/dL   HCT 43.7 40.0 - 52.0 %   MCV 87.5 80.0 - 100.0 fL   MCH 28.9 26.0 - 34.0 pg   MCHC 33.1 32.0 - 36.0 g/dL   RDW 12.9 11.5 - 14.5 %   Platelets 178 150 - 440 K/uL  Lipid panel     Status: Abnormal   Collection Time: 12/28/15  7:43 AM  Result Value Ref Range   Cholesterol 108 0 - 200 mg/dL   Triglycerides 62 <150 mg/dL   HDL 33 (L) >40 mg/dL   Total CHOL/HDL Ratio  3.3 RATIO   VLDL 12 0 - 40 mg/dL   LDL Cholesterol 63 0 - 99 mg/dL    Comment:        Total Cholesterol/HDL:CHD Risk Coronary Heart Disease Risk Table                     Men   Women  1/2 Average Risk   3.4   3.3  Average Risk       5.0   4.4  2 X Average Risk   9.6   7.1  3 X Average Risk  23.4   11.0        Use the calculated Patient Ratio above and the CHD Risk Table to determine the patient's CHD Risk.        ATP III CLASSIFICATION (LDL):  <100     mg/dL   Optimal  100-129  mg/dL   Near or Above                    Optimal  130-159  mg/dL   Borderline  160-189  mg/dL   High  >190     mg/dL   Very High     Physical Findings: AIMS: Facial and Oral Movements Muscles of Facial Expression: None, normal Lips and Perioral Area: None, normal Jaw: None, normal Tongue: None, normal,Extremity Movements Upper (arms, wrists, hands, fingers): None, normal Lower (legs, knees, ankles, toes): None, normal, Trunk Movements Neck, shoulders, hips: None, normal, Overall Severity Severity of abnormal movements (highest score from questions above): None, normal Incapacitation due to abnormal movements: None, normal Patient's awareness of abnormal movements (rate only patient's report): No Awareness, Dental Status Current problems with teeth and/or dentures?: No Does patient usually wear  dentures?: No  CIWA:    COWS:     Musculoskeletal: Strength & Muscle Tone: within normal limits Gait & Station: unsteady Patient leans: N/A  Psychiatric Specialty Exam: Review of Systems  Constitutional: Positive for malaise/fatigue.  HENT: Negative.   Eyes: Negative.   Respiratory: Negative.   Cardiovascular: Negative.   Gastrointestinal: Negative.   Musculoskeletal: Negative.   Skin: Negative.   Neurological: Negative.   Psychiatric/Behavioral: Positive for depression and memory loss. Negative for suicidal ideas, hallucinations and substance abuse. The patient is nervous/anxious and has  insomnia.     Blood pressure 174/111, pulse 99, temperature 98.8 F (37.1 C), temperature source Tympanic, resp. rate 18, height 6' (1.829 m), weight 102.967 kg (227 lb), SpO2 100 %.Body mass index is 30.78 kg/(m^2).  General Appearance: Disheveled  Eye Sport and exercise psychologist::  Fair  Speech:  Slow  Volume:  Decreased  Mood:  Dysphoric  Affect:  Flat  Thought Process:  Goal Directed  Orientation:  Full (Time, Place, and Person)  Thought Content:  Rumination  Suicidal Thoughts:  No  Homicidal Thoughts:  No  Memory:  Immediate;   Fair Recent;   Fair Remote;   Fair  Judgement:  Impaired  Insight:  Fair  Psychomotor Activity:  Decreased  Concentration:  Fair  Recall:  [employer] of Knowledge:Fair  Language: Fair  Akathisia:  No  Handed:  Right  AIMS (if indicated):     Assets:  Desire for Improvement Financial Resources/Insurance Housing Resilience Social Support  ADL's:  Impaired  Cognition: Impaired,  Mild  Sleep:  Number of Hours: 7.15   Treatment Plan Summary: Daily contact with patient to assess and evaluate symptoms and progress in treatment, Medication management and Plan Continue ECT bilateral 3 times a week. Spoke at some length with the patient about how important it would be for him to communicate better with the full treatment team especially with me about what is on his mind. Ultimately we will be in a difficult situation as he really doesn't have finances for placement but it is current condition and his wife justifiably is on comfortable with him coming home. I very much would like to get him to regain a lot of functioning get more mentally stable. Strongly encouraged him to get up and active and alert. Otherwise continue medicines for now and ECT. We will discontinue the one-to-one observation. He is walking very stably with his walker and has not been engaging in any dangerous behavior. Plan reviewed with nursing who are in agreement [address] 12/29/2015, 7:58 PM

## 2015-12-29 NOTE — Transfer of Care (Signed)
Immediate Anesthesia Transfer of Care Note  Patient: Christian [name]  Procedure(s) Performed: * No procedures listed *  Patient Location: PACU  Anesthesia Type:General  Level of Consciousness: sedated and patient cooperative  Airway & Oxygen Therapy: Patient Spontanous Breathing and Patient connected to face mask oxygen  Post-op Assessment: Report given to RN and Post -op Vital signs reviewed and stable  Post vital signs: Reviewed and stable  Last Vitals:  Filed Vitals:   12/29/15 0949 12/29/15 1245  BP: 136/84 174/111  Pulse: 101   Temp: 36.6 C 37.3 C  Resp: 18 24    Complications: No apparent anesthesia complications

## 2015-12-29 NOTE — Progress Notes (Signed)
Recreation Therapy Notes  Date: 01.30.17 Time: 3:00 pm Location: Craft Room  Group Topic: Self-expression  Goal Area(s) Addresses:  Patient will identify one color per emotion listed on wheel. Patient will verbalize benefit of using art as a means of self-expression. Patient will verbalize one emotion experienced during group. Patient will be educated on other forms of self-expression.  Behavioral Response: Did not attend  Intervention: Emotion Wheel  Activity: Patients were given an Emotion Wheel worksheet with 7 different emotions and were instructed to pick a color for each emotion.  Education: LRT educated patients on other forms of self-expression.  Education Outcome: Patient did not attend group.  Clinical Observations/Feedback: Patient did not attend group.  Jacquelynn Cree, LRT/CTRS 12/29/2015 3:52 PM

## 2015-12-29 NOTE — Progress Notes (Signed)
D: Patient denies SI/HI/AVH.   Patient affect is flat and his mood is depressed.  Patient attended ECT today and was isolative once returning to the unit.  No distress noted. A: Support and encouragement offered. Scheduled medications given to pt. Q 15 min checks continued for patient safety. R: Patient receptive. Patient remains safe on the unit.

## 2015-12-29 NOTE — H&P (Signed)
Christian [name] is an 65 y.o. male.   Chief Complaint: Patient with bipolar disorder suffering from severe depression very withdrawn. Very slow. HPI: Receiving ECT. Back in the hospital after an episode of psychotic behavior. Minimal recovery so far.  Past Medical History  Diagnosis Date  . Mood swings ([place])   . Type II or unspecified type diabetes mellitus with unspecified complication, uncontrolled 06/02/2014  . Other and unspecified hyperlipidemia 06/02/2014  . OSA on CPAP 06/02/2014  . 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 Bipolar disorder ([place])   . Hypertension   . Gout   . Bipolar 1 disorder ([place])   . Hx of blood clots     History reviewed. No pertinent past surgical history.  Family History  Problem Relation Age of Onset  . Dementia Mother   . Arthritis Father   . Hypertension Father   . Heart failure Neg Hx   . Kidney failure Neg Hx   . Cancer Neg Hx   . Mental illness Sister   . Diabetes Sister    Social History:  reports that he has never smoked. He does not have any smokeless tobacco history on file. He reports that he does not drink alcohol or use illicit drugs.  Allergies: No Known Allergies  Medications Prior to Admission  Medication Sig Dispense Refill  . lisinopril (PRINIVIL,ZESTRIL) 40 MG tablet Take 40 mg by mouth daily.    Marland Kitchen allopurinol (ZYLOPRIM) 300 MG tablet Take 1 tablet (300 mg total) by mouth daily. 30 tablet 0  . apixaban (ELIQUIS) 5 MG TABS tablet Take 1 tablet (5 mg total) by mouth 2 (two) times daily. 60 tablet 0  . aspirin EC 81 MG EC tablet Take 1 tablet (81 mg total) by mouth daily. 30 tablet 0  . atorvastatin (LIPITOR) 10 MG tablet Take 1 tablet (10 mg total) by mouth daily at 6 PM. 30 tablet 0  . clonazePAM (KLONOPIN) 1 MG tablet Take 1 tablet (1 mg total) by mouth at bedtime. 30 tablet 0  . docusate sodium (COLACE) 100 MG capsule Take 2 capsules (200 mg total) by mouth 2 (two) times daily. 60 capsule 0   . finasteride (PROSCAR) 5 MG tablet Take 1 tablet (5 mg total) by mouth daily. 30 tablet 0  . furosemide (LASIX) 20 MG tablet Take 20 mg by mouth.    . metFORMIN (GLUCOPHAGE) 500 MG tablet Take 500 mg by mouth daily. 2 tablets    . mirtazapine (REMERON) 45 MG tablet Take 1 tablet (45 mg total) by mouth at bedtime. 30 tablet 0  . OLANZapine (ZYPREXA) 15 MG tablet Take 2 tablets (30 mg total) by mouth at bedtime. 60 tablet 0  . polyethylene glycol (MIRALAX / GLYCOLAX) packet Take 17 g by mouth daily. 30 each 0  . tamsulosin (FLOMAX) 0.4 MG CAPS capsule Take 1 capsule (0.4 mg total) by mouth daily after supper. 30 capsule 0    Results for orders placed or performed during the hospital encounter of 12/22/15 (from the past 48 hour(s))  Creatinine, serum     Status: Abnormal   Collection Time: 12/28/15  7:43 AM  Result Value Ref Range   Creatinine, Ser 1.60 (H) 0.61 - 1.24 mg/dL   GFR calc non Af Amer 45 (L) >60 mL/min   GFR calc Af Amer 52 (L) >60 mL/min    Comment: (NOTE) The eGFR has been calculated using the CKD EPI equation. This calculation has not been validated  in all clinical situations. eGFR's persistently <60 mL/min signify possible Chronic Kidney Disease.   CBC     Status: None   Collection Time: 12/28/15  7:43 AM  Result Value Ref Range   WBC 3.9 3.8 - 10.6 K/uL   RBC 4.99 4.40 - 5.90 MIL/uL   Hemoglobin 14.4 13.0 - 18.0 g/dL   HCT 43.7 40.0 - 52.0 %   MCV 87.5 80.0 - 100.0 fL   MCH 28.9 26.0 - 34.0 pg   MCHC 33.1 32.0 - 36.0 g/dL   RDW 12.9 11.5 - 14.5 %   Platelets 178 150 - 440 K/uL  Lipid panel     Status: Abnormal   Collection Time: 12/28/15  7:43 AM  Result Value Ref Range   Cholesterol 108 0 - 200 mg/dL   Triglycerides 62 <150 mg/dL   HDL 33 (L) >40 mg/dL   Total CHOL/HDL Ratio 3.3 RATIO   VLDL 12 0 - 40 mg/dL   LDL Cholesterol 63 0 - 99 mg/dL    Comment:        Total Cholesterol/HDL:CHD Risk Coronary Heart Disease Risk Table                     Men    Women  1/2 Average Risk   3.4   3.3  Average Risk       5.0   4.4  2 X Average Risk   9.6   7.1  3 X Average Risk  23.4   11.0        Use the calculated Patient Ratio above and the CHD Risk Table to determine the patient's CHD Risk.        ATP III CLASSIFICATION (LDL):  <100     mg/dL   Optimal  100-129  mg/dL   Near or Above                    Optimal  130-159  mg/dL   Borderline  160-189  mg/dL   High  >190     mg/dL   Very High    No results found.  Review of Systems  Constitutional: Negative.   HENT: Negative.   Eyes: Negative.   Respiratory: Negative.   Cardiovascular: Negative.   Gastrointestinal: Negative.   Musculoskeletal: Negative.   Skin: Negative.   Neurological: Negative.   Psychiatric/Behavioral: Positive for depression and memory loss. Negative for suicidal ideas, hallucinations and substance abuse. The patient is nervous/anxious and has insomnia.     Blood pressure 136/84, pulse 101, temperature 97.8 F (36.6 C), temperature source Oral, resp. rate 18, height 6' (1.829 m), weight 102.967 kg (227 lb), SpO2 100 %. Physical Exam  Nursing note and vitals reviewed. Constitutional: He appears well-developed and well-nourished.  HENT:  Head: Normocephalic and atraumatic.  Eyes: Conjunctivae are normal. Pupils are equal, round, and reactive to light.  Neck: Normal range of motion.  Cardiovascular: Normal rate, regular rhythm and normal heart sounds.   Respiratory: Effort normal and breath sounds normal. No respiratory distress.  GI: Soft.  Musculoskeletal: Normal range of motion.  Neurological: He is alert.  Skin: Skin is warm and dry.  Psychiatric: Judgment and thought content normal. His speech is delayed. He is slowed and withdrawn. He exhibits a depressed mood. He exhibits abnormal recent memory.     Assessment/Plan Continue bilateral ECT as well as medication management as we try and get him back to a stable mental state  Christian [name] 12/29/2015,  12:21 PM

## 2015-12-29 NOTE — Anesthesia Preprocedure Evaluation (Addendum)
Anesthesia Evaluation  Patient identified by MRN, date of birth, ID band Patient awake    Reviewed: Allergy & Precautions, H&P , NPO status , Patient's Chart, lab work & pertinent test results, Unable to perform ROS - Chart review only  History of Anesthesia Complications Negative for: history of anesthetic complications  Airway Mallampati: IV  TM Distance: >3 FB Neck ROM: Limited    Dental  (+) Missing, Poor Dentition   Pulmonary shortness of breath and with exertion, sleep apnea and Continuous Positive Airway Pressure Ventilation ,    Pulmonary exam normal        Cardiovascular Exercise Tolerance: Poor hypertension, Pt. on medications + Peripheral Vascular Disease  Normal cardiovascular exam     Neuro/Psych PSYCHIATRIC DISORDERS Depression Bipolar Disorder    GI/Hepatic neg GERD  ,  Endo/Other  diabetes, Type 2BG 259 this morning.  Renal/GU      Musculoskeletal   Abdominal (+) + obese,   Peds  Hematology   Anesthesia Other Findings Past Medical History:   Mood swings (HCC)                                            Type II or unspecified type diabetes mellitus * 06/02/2014     Other and unspecified hyperlipidemia            06/02/2014     OSA on CPAP                                     06/02/2014     Bilateral lower extremity edema: chronic with * 06/02/2014     Bipolar disorder (HCC)                                       Hypertension                                                 Gout                                                         Bipolar 1 disorder (HCC)                                     Hx of blood clots                                           BMI    Body Mass Index   31.18 kg/m 2      Reproductive/Obstetrics                             Anesthesia Physical  Anesthesia Plan  ASA: III    Anesthesia Plan: General   Post-op Pain Management:    Induction:  Intravenous  Airway Management Planned: Mask  Additional Equipment:   Intra-op Plan:   Post-operative Plan:   Informed Consent: I have reviewed the patients History and Physical, chart, labs and discussed the procedure including the risks, benefits and alternatives for the proposed anesthesia with the patient or authorized representative who has indicated his/her understanding and acceptance.   Dental advisory given  Plan Discussed with: CRNA, Anesthesiologist and Surgeon  Anesthesia Plan Comments:         Anesthesia Quick Evaluation  

## 2015-12-29 NOTE — Progress Notes (Signed)
D:Pt in room upon approach. Affect is slightly brighter today than last night. Pt mostly isolated to room, however did go out for snack. Remains guarded. Denies SI, AVH and pain.  A:Q15 minute checks maintained for safety. Encouragement and support provided. Medications given as prescribed. R:Pt calm and cooperative, noted to be out of his room more and affect slightly brighter.Still a bit guarded and isolative. Voiced no concerns or complaints. Will continue to monitor.

## 2015-12-30 LAB — CREATININE, SERUM
Creatinine, Ser: 1.59 mg/dL — ABNORMAL HIGH (ref 0.61–1.24)
GFR calc Af Amer: 52 mL/min — ABNORMAL LOW (ref >60)
GFR calc non Af Amer: 45 mL/min — ABNORMAL LOW (ref >60)
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
## 2015-12-30 NOTE — Progress Notes (Signed)
Patient with depressed affect, cooperative behavior with meals, meds and plan of care. Quiet and withdrawn behavior with minimal interaction with peers. Verbalizes needs appropriately when staff initiates interaction. Rests in bed during free time. Ambulates with slow and steady gait with walker. Safety maintained.

## 2015-12-30 NOTE — Progress Notes (Signed)
D: Pt remains isolative in his room this evening. Mood and affect are depressed. Pt reports he had ECT today but still just feels "so-so". States that ECT makes him tired and he has been sleeping on and off since he has been back on the unit. Pt is guarded and forwards little information. Denies SI/HI/AVH at this time. Denies pain.  A: Emotional support and encouragement provided. Medications administered with education. q15 minute safety checks maintained. R: Pt remains free from harm.

## 2015-12-30 NOTE — Progress Notes (Signed)
[facility] MD Progress Note  12/30/2015 6:40 PM Christian [name]  MRN:  [id number] Subjective: F patient is in between ECT treatments today. I saw him in his room in the afternoon. I was sorry to see that he was still lying in bed pretty much in the same position that he is always in. He is wide awake and makes eye contact but speaks very little. Says he is still feeling depressed. Unable to articulate what is actually on his mind. Denies any suicidal intent. Denies any psychotic symptoms. Very poor self-care.  Principal Problem: Bipolar disorder, current episode depressed, severe, with psychotic features (HCC) Diagnosis:   Patient Active Problem List   Diagnosis Date Noted  . Bipolar I disorder, most recent episode depressed, severe w psychosis (HCC) [F31.5] 12/22/2015  . Bipolar affective disorder, depressed, severe, with psychotic behavior (HCC) [F31.5]   . Bipolar I disorder, current or most recent episode depressed, with psychotic features (HCC) [F31.5]   . Bipolar I disorder, most recent episode depressed (HCC) [F31.30]   . Severe bipolar I disorder with depression (HCC) [F31.4]   . Bipolar I disorder, most recent episode depressed, severe with psychotic features (HCC) [F31.5]   . Overdose [T50.901A] 09/11/2015  . Acute respiratory failure with hypoxia (HCC) [J96.01] 09/05/2015  . Elevated troponin [R79.89] 09/05/2015  . Somnolence [R40.0] 09/05/2015  . Bipolar depression (HCC) [F31.30] 09/05/2015  . Acute bilateral deep vein thrombosis (DVT) of femoral veins (HCC) [I82.413] 09/05/2015  . Bilateral pulmonary embolism (HCC) [I26.99] 09/02/2015  . Labile hypertension [I10]   . Pulmonary embolism with acute cor pulmonale (HCC) [I26.09]   . Dyspnea [R06.00]   . Orthostatic hypotension [I95.1]   . Bipolar disorder, in partial remission, most recent episode depressed (HCC) [F31.75]   . Syncope, near [R55] 08/31/2015  . Affective psychosis, bipolar (HCC) [F31.9]   . Bipolar disorder, now  depressed (HCC) [F31.30] 08/28/2015  . Bipolar I disorder, severe, current or most recent episode depressed, with psychotic features (HCC) [F31.5]   . Pressure ulcer [L89.90] 07/30/2015  . Protein-calorie malnutrition, severe (HCC) [E43] 07/30/2015  . Dehydration [E86.0] 07/29/2015  . Bipolar disorder, current episode depressed, severe, with psychotic features (HCC) [F31.5] 07/09/2015  . Diabetes (HCC) [E11.9] 07/09/2015  . UTI (urinary tract infection) [N39.0] 06/19/2014  . Positive blood culture [R78.81] 06/19/2014  . Bipolar affective disorder, current episode manic with psychotic symptoms (HCC) [F31.2] 06/17/2014  . Type II or unspecified type diabetes mellitus with unspecified complication, uncontrolled [E11.8, E11.65] 06/02/2014  . Other and unspecified hyperlipidemia [E78.5] 06/02/2014  . OSA on CPAP [G47.33] 06/02/2014  . 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 Gout [M10.9] 06/02/2014  . Hypertension [I10]    Total Time spent with patient: 30 minutes  Past Psychiatric History: Patient has a long history of bipolar disorder with both depressed and manic phases in the past a history of suicide attempts and now a history of aggression towards his wife. Has responded well to ECT but is very fragile and his illness with a tendency to relapse quickly.  Past Medical History:  Past Medical History  Diagnosis Date  . Mood swings (HCC)   . Type II or unspecified type diabetes mellitus with unspecified complication, uncontrolled 06/02/2014  . Other and unspecified hyperlipidemia 06/02/2014  . OSA on CPAP 06/02/2014  . 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 Bipolar disorder (HCC)   . Hypertension   . Gout   . Bipolar 1  disorder (HCC)   . Hx of blood clots    History reviewed. No pertinent past surgical history. Family History:  Family History  Problem Relation Age of Onset  . Dementia Mother   .  Arthritis Father   . Hypertension Father   . Heart failure Neg Hx   . Kidney failure Neg Hx   . Cancer Neg Hx   . Mental illness Sister   . Diabetes Sister    Family Psychiatric  History: Positive family history for anxiety and bipolar disorder Social History:  History  Alcohol Use No     History  Drug Use No    Social History   Social History  . Marital Status: Married    Spouse Name: N/A  . Number of Children: N/A  . Years of Education: N/A   Social History Main Topics  . Smoking status: Never Smoker   . Smokeless tobacco: None  . Alcohol Use: No  . Drug Use: No  . Sexual Activity: Not Currently   Other Topics Concern  . None   Social History Narrative   *The patient has been married for over 30 years and is currently on disability. He lives with his wife in the [place] area and has one daughter.             Additional Social History:                         Sleep: Fair  Appetite:  Fair  Current Medications: Current Facility-Administered Medications  Medication Dose Route Frequency Provider Last Rate Last Dose  . acetaminophen (TYLENOL) tablet 650 mg  650 mg Oral Q6H PRN Audery Amel, MD   650 mg at 12/23/15 2315  . allopurinol (ZYLOPRIM) tablet 300 mg  300 mg Oral Daily Audery Amel, MD   300 mg at 12/30/15 0915  . alum & mag hydroxide-simeth (MAALOX/MYLANTA) 200-200-20 MG/5ML suspension 30 mL  30 mL Oral Q4H PRN Audery Amel, MD      . apixaban (ELIQUIS) tablet 5 mg  5 mg Oral BID Audery Amel, MD   5 mg at 12/30/15 0916  . aspirin EC tablet 81 mg  81 mg Oral Daily Audery Amel, MD   81 mg at 12/30/15 0915  . atorvastatin (LIPITOR) tablet 10 mg  10 mg Oral q1800 Audery Amel, MD   10 mg at 12/30/15 1800  . docusate sodium (COLACE) capsule 100 mg  100 mg Oral BID Audery Amel, MD   100 mg at 12/29/15 1532  . finasteride (PROSCAR) tablet 5 mg  5 mg Oral Daily Audery Amel, MD   5 mg at 12/30/15 1610  . furosemide (LASIX) tablet 20  mg  20 mg Oral Daily Audery Amel, MD   20 mg at 12/30/15 0915  . labetalol (NORMODYNE,TRANDATE) injection 20 mg  20 mg Intravenous Once Audery Amel, MD   20 mg at 12/24/15 1348  . lisinopril (PRINIVIL,ZESTRIL) tablet 40 mg  40 mg Oral Daily Shari Prows, MD   40 mg at 12/30/15 0916  . magnesium hydroxide (MILK OF MAGNESIA) suspension 30 mL  30 mL Oral Daily PRN Audery Amel, MD      . metFORMIN (GLUCOPHAGE) tablet 500 mg  500 mg Oral BID WC Audery Amel, MD   500 mg at 12/30/15 1654  . mirtazapine (REMERON) tablet 45 mg  45 mg Oral QHS Audery Amel, MD  45 mg at 12/29/15 2119  . OLANZapine (ZYPREXA) tablet 20 mg  20 mg Oral QHS Audery Amel, MD   20 mg at 12/29/15 2119  . polyethylene glycol (MIRALAX / GLYCOLAX) packet 17 g  17 g Oral Daily Audery Amel, MD   17 g at 12/29/15 1532  . tamsulosin (FLOMAX) capsule 0.4 mg  0.4 mg Oral QPC supper Audery Amel, MD   0.4 mg at 12/30/15 1653    Lab Results:  No results found for this or any previous visit (from the past 48 hour(s)).  Physical Findings: AIMS: Facial and Oral Movements Muscles of Facial Expression: None, normal Lips and Perioral Area: None, normal Jaw: None, normal Tongue: None, normal,Extremity Movements Upper (arms, wrists, hands, fingers): None, normal Lower (legs, knees, ankles, toes): None, normal, Trunk Movements Neck, shoulders, hips: None, normal, Overall Severity Severity of abnormal movements (highest score from questions above): None, normal Incapacitation due to abnormal movements: None, normal Patient's awareness of abnormal movements (rate only patient's report): No Awareness, Dental Status Current problems with teeth and/or dentures?: No Does patient usually wear dentures?: No  CIWA:    COWS:     Musculoskeletal: Strength & Muscle Tone: within normal limits Gait & Station: unsteady Patient leans: N/A  Psychiatric Specialty Exam: Review of Systems  Constitutional: Positive for  malaise/fatigue.  HENT: Negative.   Eyes: Negative.   Respiratory: Negative.   Cardiovascular: Negative.   Gastrointestinal: Negative.   Musculoskeletal: Negative.   Skin: Negative.   Neurological: Positive for weakness.  Psychiatric/Behavioral: Positive for depression. Negative for suicidal ideas, hallucinations, memory loss and substance abuse. The patient has insomnia. The patient is not nervous/anxious.     Blood pressure 92/78, pulse 119, temperature 98.5 F (36.9 C), temperature source Oral, resp. rate 18, height 6' (1.829 m), weight 102.967 kg (227 lb), SpO2 100 %.Body mass index is 30.78 kg/(m^2).  General Appearance: Disheveled  Eye Solicitor::  Fair  Speech:  Slow  Volume:  Decreased  Mood:  Dysphoric  Affect:  Flat  Thought Process:  Goal Directed  Orientation:  Full (Time, Place, and Person)  Thought Content:  Rumination  Suicidal Thoughts:  No  Homicidal Thoughts:  No  Memory:  Immediate;   Fair Recent;   Fair Remote;   Fair  Judgement:  Impaired  Insight:  Fair  Psychomotor Activity:  Decreased  Concentration:  Fair  Recall:  [employer] of Knowledge:Fair  Language: Fair  Akathisia:  No  Handed:  Right  AIMS (if indicated):     Assets:  Desire for Improvement Financial Resources/Insurance Housing Resilience Social Support  ADL's:  Impaired  Cognition: Impaired,  Mild  Sleep:  Number of Hours: 8.5   Treatment Plan Summary: Daily contact with patient to assess and evaluate symptoms and progress in treatment, Medication management and Plan Patient was awake today. He says he feels depressed. He denies feeling like he has any trouble with his memory. He does seem cognitively very slow. Once again I talked with him about how important it would be for him to be getting up and exercising at least to the extent of moving around the unit a little bit. We also discussed his medication. Patient has had some benefit from lithium in the past and has a family history of  benefit as well. I'm going to take a look at his labs and if his kidneys look like they would tolerate it we may start a small dose of lithium in addition to  the medicine he is taking to try to boost the effectiveness of treating his depression. Patient agrees to the plan. ECT is scheduled for tomorrow. Patient is still far too sick to be discharged from the hospital particularly given that his recent homicidal behavior towards his wife makes discharge home very difficult to consider. We will discontinue the one-to-one observation. He is walking very stably with his walker and has not been engaging in any dangerous behavior. Plan reviewed with nursing who are in agreement [name] 12/30/2015, 6:40 PM

## 2015-12-30 NOTE — Plan of Care (Signed)
Problem: Alteration in mood Goal: STG-Patient is able to discuss feelings and issues (Patient is able to discuss feelings and issues leading to depression)  Outcome: Not Progressing Pt is guarded and forwards minimal information to staff regarding feelings of depression

## 2015-12-30 NOTE — Progress Notes (Signed)
Patient completes daily audit sheet with following directions and staying on point as his goal.

## 2015-12-30 NOTE — Plan of Care (Signed)
Problem: [facility] Participation in Recreation Therapeutic Interventions Goal: STG-Patient will demonstrate improved self esteem by identif STG: Self-Esteem - Within 4 treatment sessions, patient will verbalize at least 5 positive affirmation statements in each of 2 treatment sessions to increase self-esteem post d/c.  Outcome: Not Progressing Treatment Session 1; Completed 0 out of 2: At approximately 1:15 pm, LRT attempted treatment session. Patient refused stating he didn't want to talk.   Jacquelynn Cree, LRT/CTRS 01.31.17 1:58 pm Goal: STG-Other Recreation Therapy Goal (Specify) STG: Stress Management - Within 4 treatment sessions, patient will verbalize understanding of the stress management techniques in each of 2 treatment sessions to increase stress management skills post d/c.  Outcome: Not Progressing Treatment Session 1; Completed 0 out of 2: At approximately 1:15 pm, LRT attempted treatment session. Patient refused stating he didn't want to talk.   Jacquelynn Cree, LRT/CTRS 01.31.17 1:58 pm

## 2015-12-30 NOTE — Progress Notes (Signed)
Recreation Therapy Notes  Date: 01.31.17 Time: 3:00 pm Location: Craft Room  Group Topic: Goal Setting  Goal Area(s) Addresses:  Patient will write at least one goal. Patient will write at least one obstacle.  Behavioral Response: Did not attend  Intervention: Recovery Goal Chart  Activity: Patients were instructed to make a Recovery Goal Chart including goals, obstacles, the date they started working on their goals, and the date they achieved their goals.  Education: LRT educated patients on ways they can celebrate reaching their goals.  Education Outcome: Patient did not attend group.  Clinical Observations/Feedback: Patient did not attend group.  Jacquelynn Cree, LRT/CTRS 12/30/2015 4:20 PM

## 2015-12-30 NOTE — [facility] Group Notes (Signed)
[facility] Group Notes:  (Nursing/MHT/Case Management/Adjunct)  Date:  12/30/2015  Time:  1:23 AM  Type of Therapy:  Group Therapy  Participation Level:  Did Not Attend   Summary of Progress/Problems:  Christian [name] 12/30/2015, 1:23 AM

## 2015-12-30 NOTE — Plan of Care (Signed)
Problem: Consults Goal: [facility] Treatment Patient Education Outcome: Progressing Remains depressed, cooperative with meds and plan of care.

## 2015-12-30 NOTE — Progress Notes (Signed)
Patient ID: Christian [name], male   DOB: 02-14-1951, 65 y.o.   MRN: [id number] Spoke with Ms. Middlesworth.  She re-states that Pt may not return home with her and has no other options for him to live with other family at this time.  Instructed her to meet with Special Assistance Medicaid worker at [facility] to explore if he would meet criteria for being placed or if assets would be too large.  She agreed to follow up on this.  Told her we would begin PASSR process, in case there was a home willing to take him with Medicaid pending.

## 2015-12-30 NOTE — Anesthesia Postprocedure Evaluation (Signed)
Anesthesia Post Note  Patient: Christian [name]  Procedure(s) Performed: * No procedures listed *  Patient location during evaluation: PACU Anesthesia Type: General Level of consciousness: awake and alert and oriented Pain management: pain level controlled Vital Signs Assessment: post-procedure vital signs reviewed and stable Respiratory status: spontaneous breathing Cardiovascular status: blood pressure returned to baseline Anesthetic complications: no    Last Vitals:  Filed Vitals:   12/29/15 1317 12/30/15 0711  BP:  92/78  Pulse: 99 119  Temp: 37.1 C 36.9 C  Resp: 18 18    Last Pain:  Filed Vitals:   12/30/15 1031  PainSc: 0-No pain                 Leighanne Adolph

## 2015-12-31 ENCOUNTER — Inpatient Hospital Stay: Payer: Commercial Managed Care - HMO | Admitting: Anesthesiology

## 2015-12-31 ENCOUNTER — Encounter: Payer: Self-pay | Admitting: *Deleted

## 2015-12-31 ENCOUNTER — Inpatient Hospital Stay: Payer: Commercial Managed Care - HMO

## 2015-12-31 LAB — CBC
HCT: 42.2 % (ref 40.0–52.0)
Hemoglobin: 13.9 g/dL (ref 13.0–18.0)
MCH: 29.4 pg (ref 26.0–34.0)
MCHC: 32.8 g/dL (ref 32.0–36.0)
MCV: 89.5 fL (ref 80.0–100.0)
Platelets: 167 10*3/uL (ref 150–440)
RBC: 4.71 MIL/uL (ref 4.40–5.90)
RDW: 13.2 % (ref 11.5–14.5)
WBC: 3.8 10*3/uL (ref 3.8–10.6)

## 2015-12-31 LAB — CREATININE, SERUM
Creatinine, Ser: 1.47 mg/dL — ABNORMAL HIGH (ref 0.61–1.24)
GFR calc Af Amer: 57 mL/min — ABNORMAL LOW (ref >60)
GFR calc non Af Amer: 49 mL/min — ABNORMAL LOW (ref >60)

## 2015-12-31 MED ORDER — SODIUM CHLORIDE 0.9 % IV SOLN
INTRAVENOUS | Status: DC | PRN
  Administered 2015-12-31: 11:00:00 via INTRAVENOUS

## 2015-12-31 MED ORDER — KETAMINE HCL 10 MG/ML IJ SOLN
INTRAMUSCULAR | Status: DC | PRN
  Administered 2015-12-31: 120 mg via INTRAVENOUS

## 2015-12-31 MED ORDER — SODIUM CHLORIDE 0.9 % IV SOLN
250.0000 mL | Freq: Once | INTRAVENOUS | Status: AC
  Administered 2015-12-31: 500 mL via INTRAVENOUS

## 2015-12-31 MED ORDER — SUCCINYLCHOLINE 20MG/ML (10ML) SYRINGE FOR MEDFUSION PUMP - OPTIME
INTRAMUSCULAR | Status: DC | PRN
  Administered 2015-12-31: 150 mg via INTRAVENOUS

## 2015-12-31 MED ORDER — QUETIAPINE FUMARATE 200 MG PO TABS
200.0000 mg | ORAL_TABLET | Freq: Every day | ORAL | Status: DC
  Administered 2015-12-31 – 2016-01-01 (×2): 200 mg via ORAL
  Filled 2015-12-31 (×2): qty 1

## 2015-12-31 NOTE — Progress Notes (Signed)
[facility] MD Progress Note  12/31/2015 4:19 PM Christian [name]  MRN:  [id number] Subjective: Follow-up for this patient with bipolar depression. He had ECT this morning which went well. This afternoon I found the patient awake and alert. He still spends most of his time lying in bed but he told me that his mood was feeling better. He seemed to be more alert. Denied suicidal thoughts. Denied psychosis.  Principal Problem: Bipolar disorder, current episode depressed, severe, with psychotic features ([place]) Diagnosis:   Patient Active Problem List   Diagnosis Date Noted  . Bipolar I disorder, most recent episode depressed, severe w psychosis ([place]) [F31.5] 12/22/2015  . Bipolar affective disorder, depressed, severe, with psychotic behavior ([place]) [F31.5]   . Bipolar I disorder, current or most recent episode depressed, with psychotic features ([place]) [F31.5]   . Bipolar I disorder, most recent episode depressed ([place]) [F31.30]   . Severe bipolar I disorder with depression ([place]) [F31.4]   . Bipolar I disorder, most recent episode depressed, severe with psychotic features ([place]) [F31.5]   . Overdose [T50.901A] 09/11/2015  . Acute respiratory failure with hypoxia ([place]) [J96.01] 09/05/2015  . Elevated troponin [R79.89] 09/05/2015  . Somnolence [R40.0] 09/05/2015  . Bipolar depression ([place]) [F31.30] 09/05/2015  . Acute bilateral deep vein thrombosis (DVT) of femoral veins (HCC) [I82.413] 09/05/2015  . Bilateral pulmonary embolism ([place]) [I26.99] 09/02/2015  . Labile hypertension [I10]   . Pulmonary embolism with acute cor pulmonale ([place]) [I26.09]   . Dyspnea [R06.00]   . Orthostatic hypotension [I95.1]   . Bipolar disorder, in partial remission, most recent episode depressed ([place]) [F31.75]   . Syncope, near [R55] 08/31/2015  . Affective psychosis, bipolar ([place]) [F31.9]   . Bipolar disorder, now depressed ([place]) [F31.30] 08/28/2015  . Bipolar I disorder, severe, current or most recent episode depressed, with  psychotic features ([place]) [F31.5]   . Pressure ulcer [L89.90] 07/30/2015  . Protein-calorie malnutrition, severe ([place]) [E43] 07/30/2015  . Dehydration [E86.0] 07/29/2015  . Bipolar disorder, current episode depressed, severe, with psychotic features ([place]) [F31.5] 07/09/2015  . Diabetes ([place]) [E11.9] 07/09/2015  . UTI (urinary tract infection) [N39.0] 06/19/2014  . Positive blood culture [R78.81] 06/19/2014  . Bipolar affective disorder, current episode manic with psychotic symptoms ([place]) [F31.2] 06/17/2014  . Type II or unspecified type diabetes mellitus with unspecified complication, uncontrolled [E11.8, E11.65] 06/02/2014  . Other and unspecified hyperlipidemia [E78.5] 06/02/2014  . OSA on CPAP [G47.33] 06/02/2014  . 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 Gout [M10.9] 06/02/2014  . Hypertension [I10]    Total Time spent with patient: 30 minutes  Past Psychiatric History: Patient has a long history of bipolar disorder with both depressed and manic phases in the past a history of suicide attempts and now a history of aggression towards his wife. Has responded well to ECT but is very fragile and his illness with a tendency to relapse quickly.  Past Medical History:  Past Medical History  Diagnosis Date  . Mood swings ([place])   . Type II or unspecified type diabetes mellitus with unspecified complication, uncontrolled 06/02/2014  . Other and unspecified hyperlipidemia 06/02/2014  . OSA on CPAP 06/02/2014  . 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 Bipolar disorder ([place])   . Hypertension   . Gout   . Bipolar 1 disorder ([place])   . Hx of blood clots    History reviewed. No pertinent past surgical history. Family History:  Family  History  Problem Relation Age of Onset  . Dementia Mother   . Arthritis Father   . Hypertension Father   . Heart failure Neg Hx   . Kidney failure Neg Hx   . Cancer Neg Hx    . Mental illness Sister   . Diabetes Sister    Family Psychiatric  History: Positive family history for anxiety and bipolar disorder Social History:  History  Alcohol Use No     History  Drug Use No    Social History   Social History  . Marital Status: Married    Spouse Name: N/A  . Number of Children: N/A  . Years of Education: N/A   Social History Main Topics  . Smoking status: Never Smoker   . Smokeless tobacco: None  . Alcohol Use: No  . Drug Use: No  . Sexual Activity: Not Currently   Other Topics Concern  . None   Social History Narrative   *The patient has been married for over 30 years and is currently on disability. He lives with his wife in the [place] area and has one daughter.             Additional Social History:                         Sleep: Fair  Appetite:  Fair  Current Medications: Current Facility-Administered Medications  Medication Dose Route Frequency Provider Last Rate Last Dose  . acetaminophen (TYLENOL) tablet 650 mg  650 mg Oral Q6H PRN Gonzella Lex, MD   650 mg at 12/23/15 2315  . allopurinol (ZYLOPRIM) tablet 300 mg  300 mg Oral Daily Gonzella Lex, MD   300 mg at 12/31/15 1300  . alum & mag hydroxide-simeth (MAALOX/MYLANTA) 200-200-20 MG/5ML suspension 30 mL  30 mL Oral Q4H PRN Gonzella Lex, MD      . apixaban (ELIQUIS) tablet 5 mg  5 mg Oral BID Gonzella Lex, MD   5 mg at 12/31/15 1300  . aspirin EC tablet 81 mg  81 mg Oral Daily Gonzella Lex, MD   81 mg at 12/31/15 1256  . atorvastatin (LIPITOR) tablet 10 mg  10 mg Oral q1800 Gonzella Lex, MD   10 mg at 12/30/15 1800  . docusate sodium (COLACE) capsule 100 mg  100 mg Oral BID Gonzella Lex, MD   100 mg at 12/31/15 1258  . finasteride (PROSCAR) tablet 5 mg  5 mg Oral Daily Gonzella Lex, MD   5 mg at 12/31/15 1258  . furosemide (LASIX) tablet 20 mg  20 mg Oral Daily Gonzella Lex, MD   20 mg at 12/31/15 1256  . lisinopril (PRINIVIL,ZESTRIL) tablet 40 mg  40  mg Oral Daily Clovis Fredrickson, MD   40 mg at 12/31/15 0637  . magnesium hydroxide (MILK OF MAGNESIA) suspension 30 mL  30 mL Oral Daily PRN Gonzella Lex, MD      . metFORMIN (GLUCOPHAGE) tablet 500 mg  500 mg Oral BID WC Gonzella Lex, MD   500 mg at 12/31/15 1300  . mirtazapine (REMERON) tablet 45 mg  45 mg Oral QHS Gonzella Lex, MD   45 mg at 12/30/15 2122  . OLANZapine (ZYPREXA) tablet 20 mg  20 mg Oral QHS Gonzella Lex, MD   20 mg at 12/30/15 2122  . polyethylene glycol (MIRALAX / GLYCOLAX) packet 17 g  17 g Oral Daily Olvin Rohr  T Karron Goens, MD   17 g at 12/31/15 1256  . tamsulosin (FLOMAX) capsule 0.4 mg  0.4 mg Oral QPC supper Gonzella Lex, MD   0.4 mg at 12/30/15 1653    Lab Results:  Results for orders placed or performed during the hospital encounter of 12/22/15 (from the past 48 hour(s))  Creatinine, serum     Status: Abnormal   Collection Time: 12/30/15  7:37 PM  Result Value Ref Range   Creatinine, Ser 1.59 (H) 0.61 - 1.24 mg/dL   GFR calc non Af Amer 45 (L) >60 mL/min   GFR calc Af Amer 52 (L) >60 mL/min    Comment: (NOTE) The eGFR has been calculated using the CKD EPI equation. This calculation has not been validated in all clinical situations. eGFR's persistently <60 mL/min signify possible Chronic Kidney Disease.   Creatinine, serum     Status: Abnormal   Collection Time: 12/31/15  5:15 AM  Result Value Ref Range   Creatinine, Ser 1.47 (H) 0.61 - 1.24 mg/dL   GFR calc non Af Amer 49 (L) >60 mL/min   GFR calc Af Amer 57 (L) >60 mL/min    Comment: (NOTE) The eGFR has been calculated using the CKD EPI equation. This calculation has not been validated in all clinical situations. eGFR's persistently <60 mL/min signify possible Chronic Kidney Disease.   CBC     Status: None   Collection Time: 12/31/15  5:15 AM  Result Value Ref Range   WBC 3.8 3.8 - 10.6 K/uL   RBC 4.71 4.40 - 5.90 MIL/uL   Hemoglobin 13.9 13.0 - 18.0 g/dL   HCT 42.2 40.0 - 52.0 %   MCV 89.5  80.0 - 100.0 fL   MCH 29.4 26.0 - 34.0 pg   MCHC 32.8 32.0 - 36.0 g/dL   RDW 13.2 11.5 - 14.5 %   Platelets 167 150 - 440 K/uL    Physical Findings: AIMS: Facial and Oral Movements Muscles of Facial Expression: None, normal Lips and Perioral Area: None, normal Jaw: None, normal Tongue: None, normal,Extremity Movements Upper (arms, wrists, hands, fingers): None, normal Lower (legs, knees, ankles, toes): None, normal, Trunk Movements Neck, shoulders, hips: None, normal, Overall Severity Severity of abnormal movements (highest score from questions above): None, normal Incapacitation due to abnormal movements: None, normal Patient's awareness of abnormal movements (rate only patient's report): No Awareness, Dental Status Current problems with teeth and/or dentures?: No Does patient usually wear dentures?: No  CIWA:    COWS:     Musculoskeletal: Strength & Muscle Tone: within normal limits Gait & Station: unsteady Patient leans: N/A  Psychiatric Specialty Exam: Review of Systems  Constitutional: Positive for malaise/fatigue.  HENT: Negative.   Eyes: Negative.   Respiratory: Negative.   Cardiovascular: Negative.   Gastrointestinal: Negative.   Musculoskeletal: Negative.   Skin: Negative.   Neurological: Positive for weakness.  Psychiatric/Behavioral: Positive for depression. Negative for suicidal ideas, hallucinations, memory loss and substance abuse. The patient has insomnia. The patient is not nervous/anxious.     Blood pressure 122/98, pulse 87, temperature 98.4 F (36.9 C), temperature source Tympanic, resp. rate 15, height 6' (1.829 m), weight 102.059 kg (225 lb), SpO2 99 %.Body mass index is 30.51 kg/(m^2).  General Appearance: Disheveled  Eye Sport and exercise psychologist::  Fair  Speech:  Slow  Volume:  Decreased  Mood:  Dysphoric  Affect:  Flat  Thought Process:  Goal Directed  Orientation:  Full (Time, Place, and Person)  Thought Content:  Rumination  Suicidal  Thoughts:  No   Homicidal Thoughts:  No  Memory:  Immediate;   Fair Recent;   Fair Remote;   Fair  Judgement:  Impaired  Insight:  Fair  Psychomotor Activity:  Decreased  Concentration:  Fair  Recall:  [employer] of Knowledge:Fair  Language: Fair  Akathisia:  No  Handed:  Right  AIMS (if indicated):     Assets:  Desire for Improvement Financial Resources/Insurance Housing Resilience Social Support  ADL's:  Impaired  Cognition: Impaired,  Mild  Sleep:  Number of Hours: 7   Treatment Plan Summary: Daily contact with patient to assess and evaluate symptoms and progress in treatment, Medication management and Plan Continue ECT. Patient I think is gradually improving. His wife today expressed a concern that his medication could perhaps be better. She reported that she thought he was doing better on medicine that he was taking prior to coming to the hospital last time. She wasn't sure exactly what that was but asked me to go back and look into it so I will do that. It sounds like it's possible that it could be Seroquel instead of Zyprexa in which case we can make a change. I noticed that his creatinine is still little bit up and is higher than it was a few days ago which makes me more leery about starting lithium so I'm going to put off that decision for now. Once again strongly encourage patient to get up and get out of bed and get more physically active if he wants to plan on discharge and getting his functioning back. We will discontinue the one-to-one observation. He is walking very stably with his walker and has not been engaging in any dangerous behavior. Plan reviewed with nursing who are in agreement [place] 12/31/2015, 4:19 PM

## 2015-12-31 NOTE — Progress Notes (Signed)
D: Pt continues to verbalize feelings of depression. Pt denies SI/HI/AVH at this time. Denies pain. Pt remains in his room much of the evening and only comes out for snacks and medications. Pt maintains minimal interaction with staff and peers. Pt ambulates with the use of a walker. A: Emotional support provided. Pt encouraged to attend groups and be active on the unit. Medications administered with education. Pt is NPO after midnight for ECT in the morning. q15 minute safety checks maintained. R: Pt remains free from harm

## 2015-12-31 NOTE — Progress Notes (Addendum)
Patient with depressed affect, blunted. Quiet speech, fair eye contact. Cooperative with NPO status for ECT. Blood glucose 85 this am with no s/s of hypo/hyperglycemia. Blood pressure meds administered by night nurse. Minimal interaction with peers. Verbalizes needs appropriately when staff initiates interaction. Transported to ECT this am via wheelchair. Safety maintained. Completed daily audit sheet with following directions and staying on point as his goal.

## 2015-12-31 NOTE — Procedures (Addendum)
ECT SERVICES Physician's Interval Evaluation & Treatment Note  Patient Identification: Christian [name] MRN:  [id number] Date of Evaluation:  12/31/2015 TX #: [id number]  MADRS:   MMSE:   P.E. Findings:  Not eating well. Vitals stable no new physical complaints.  Psychiatric Interval Note:  Mood is still down flatter negative  Subjective:  Patient is a 65 y.o. male seen for evaluation for Electroconvulsive Therapy. No specific complaint  Treatment Summary:   []   Right Unilateral             [x]  Bilateral   % Energy : 0.5 ms 100%   Impedance: 600 ohms  Seizure Energy Index: 1012 V squared  Postictal Suppression Index: 21%  Seizure Concordance Index: 97%  Medications  Pre Shock: Labetalol 20 mg, ketamine 120 mg, succinylcholine 150 mg  Post Shock:  Labetalol 20 mg  Seizure Duration: 24 seconds by EMG 31 seconds by EEG   Comments: Next treatment Friday the third   Lungs:  [x]   Clear to auscultation               []  Other:   Heart:    [x]   Regular rhythm             []  irregular rhythm    [x]   Previous H&P reviewed, patient examined and there are NO CHANGES                 []   Previous H&P reviewed, patient examined and there are changes noted.   [name], MD 2/1/201711:30 AM

## 2015-12-31 NOTE — Transfer of Care (Signed)
Immediate Anesthesia Transfer of Care Note  Patient: Christian [name]  Procedure(s) Performed ECT  Patient Location: PACU  Anesthesia Type:General  Level of Consciousness: sedated  Airway & Oxygen Therapy: Patient Spontanous Breathing and Patient connected to face mask oxygen  Post-op Assessment: Report given to RN  Post vital signs: Reviewed and stable  Last Vitals:  Filed Vitals:   12/31/15 0908 12/31/15 1155  BP: 111/69 151/90  Pulse:  91  Temp: 36.2 C 37.2 C  Resp:  14    Complications: No apparent anesthesia complications

## 2015-12-31 NOTE — BHH Group Notes (Signed)
[facility] LCSW Aftercare Discharge Planning Group Note   12/31/2015 4:35 PM  Participation Quality:  Patient is scheduled ECT this morning and not present for group.    Lulu Riding, MSW, LCSWA

## 2015-12-31 NOTE — Anesthesia Preprocedure Evaluation (Signed)
Anesthesia Evaluation  Patient identified by MRN, date of birth, ID band Patient awake    Reviewed: Allergy & Precautions, H&P , NPO status , Patient's Chart, lab work & pertinent test results, Unable to perform ROS - Chart review only  History of Anesthesia Complications Negative for: history of anesthetic complications  Airway Mallampati: IV  TM Distance: >3 FB Neck ROM: Limited    Dental  (+) Missing, Poor Dentition   Pulmonary shortness of breath and with exertion, sleep apnea and Continuous Positive Airway Pressure Ventilation ,    Pulmonary exam normal        Cardiovascular Exercise Tolerance: Poor hypertension, Pt. on medications + Peripheral Vascular Disease  Normal cardiovascular exam     Neuro/Psych PSYCHIATRIC DISORDERS Depression Bipolar Disorder    GI/Hepatic neg GERD  ,  Endo/Other  diabetes, Type 2BG 259 this morning.  Renal/GU      Musculoskeletal   Abdominal (+) + obese,   Peds  Hematology   Anesthesia Other Findings Past Medical History:   Mood swings (HCC)                                            Type II or unspecified type diabetes mellitus * 06/02/2014     Other and unspecified hyperlipidemia            06/02/2014     OSA on CPAP                                     06/02/2014     Bilateral lower extremity edema: chronic with * 06/02/2014     Bipolar disorder (HCC)                                       Hypertension                                                 Gout                                                         Bipolar 1 disorder (HCC)                                     Hx of blood clots                                           BMI    Body Mass Index   31.18 kg/m 2      Reproductive/Obstetrics                             Anesthesia Physical  Anesthesia Plan  ASA: III  Anesthesia Plan: General   Post-op Pain Management:    Induction:  Intravenous  Airway Management Planned: Mask  Additional Equipment:   Intra-op Plan:   Post-operative Plan:   Informed Consent: I have reviewed the patients History and Physical, chart, labs and discussed the procedure including the risks, benefits and alternatives for the proposed anesthesia with the patient or authorized representative who has indicated his/her understanding and acceptance.   Dental advisory given  Plan Discussed with: CRNA, Anesthesiologist and Surgeon  Anesthesia Plan Comments:         Anesthesia Quick Evaluation

## 2015-12-31 NOTE — Plan of Care (Signed)
Problem: Consults Goal: [facility] Patient Education Outcome: Progressing Cooperative with meds, ECT and plan of care.

## 2015-12-31 NOTE — Plan of Care (Signed)
Problem: Alteration in mood Goal: LTG-Pt's behavior demonstrates decreased signs of depression (Patient's behavior demonstrates decreased signs of depression to the point the patient is safe to return home and continue treatment in an outpatient setting)  Outcome: Not Progressing Pt continues to verbalize feelings of depression. Pt denies SI.  Problem: Diagnosis: Increased Risk For Suicide Attempt Goal: STG-Patient Will Attend All Groups On The Unit Outcome: Not Progressing Pt not attending groups on the unit.

## 2015-12-31 NOTE — Progress Notes (Signed)
Initial Nutrition Assessment     INTERVENTION:  Meals and snacks: Cater to pt preferences Medical Nutrition Supplement Therapy: Will add SF mightyshake TID for added nutrition   NUTRITION DIAGNOSIS:   Inadequate oral intake related to acute illness as evidenced by energy intake < 75% for > 7 days.    GOAL:   Patient will meet greater than or equal to 90% of their needs    MONITOR:    (Energy intake, Glucose profile)  REASON FOR ASSESSMENT:   LOS    ASSESSMENT:      Pt admitted with depression, receiving ECT treatments  Past Medical History  Diagnosis Date  . Mood swings (HCC)   . Type II or unspecified type diabetes mellitus with unspecified complication, uncontrolled 06/02/2014  . Other and unspecified hyperlipidemia 06/02/2014  . OSA on CPAP 06/02/2014  . 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 Bipolar disorder (HCC)   . Hypertension   . Gout   . Bipolar 1 disorder (HCC)   . Hx of blood clots     Current Nutrition: eating on average 54% of meals  Food/Nutrition-Related History: unsure intake prior to admission   Scheduled Medications:  . allopurinol  300 mg Oral Daily  . apixaban  5 mg Oral BID  . aspirin EC  81 mg Oral Daily  . atorvastatin  10 mg Oral q1800  . docusate sodium  100 mg Oral BID  . finasteride  5 mg Oral Daily  . furosemide  20 mg Oral Daily  . lisinopril  40 mg Oral Daily  . metFORMIN  500 mg Oral BID WC  . mirtazapine  45 mg Oral QHS  . polyethylene glycol  17 g Oral Daily  . QUEtiapine  200 mg Oral QHS  . tamsulosin  0.4 mg Oral QPC supper       Electrolyte/Renal Profile and Glucose Profile:   Recent Labs Lab 12/28/15 0743 12/30/15 1937 12/31/15 0515  CREATININE 1.60* 1.59* 1.47*    Gastrointestinal Profile: Last BM: wDL   Nutrition-Focused Physical Exam Findings: deferred at this time   Weight Change: 14% wt loss in the last 2 months per wt encounters    Diet Order:   Diet Carb Modified Fluid consistency:: Thin; Room service appropriate?: Yes Diet NPO time specified  Skin:   reviewed   Height:   Ht Readings from Last 1 Encounters:  12/29/15 6' (1.829 m)    Weight:   Wt Readings from Last 1 Encounters:  12/31/15 225 lb (102.059 kg)    Ideal Body Weight:     BMI:  Body mass index is 30.51 kg/(m^2).  Estimated Nutritional Needs:   Kcal:  (25-30 kcals/kg)2000-2400 kcals/d (Using IBW of 80kg)  Protein:  0.8-1.0 g/kg) 64-80 gm/d  Fluid:  (2000-2460ml/d  EDUCATION NEEDS:   No education needs identified at this time  MODERATE Care Level  Talley Kreiser B. Freida Busman, RD, LDN [phone] (pager) Weekend/On-Call pager [phone])

## 2015-12-31 NOTE — Progress Notes (Signed)
Recreation Therapy Notes  Date: 02.01.17 Time: 3:00 pm  Location: Craft Room  Group Topic: Self-esteem  Goal Area(s) Addresses:  Patients will write at least one positive trait about themselves. Patients will verbalize benefit of having a healthy self-esteem.  Behavioral Response: Did not attend  Intervention: I Am  Activity: Patients were given a worksheet with the letter I on it and instructed to write as many positive traits inside the letter.  Education: LRT educated patients on ways they can increase their self-esteem.  Education Outcome: Patient did not attend group.  Clinical Observations/Feedback: Patient did not attend group.  Jacquelynn Cree, LRT/CTRS 12/31/2015 4:48 PM

## 2015-12-31 NOTE — Anesthesia Procedure Notes (Signed)
Date/Time: 12/31/2015 11:38 AM Performed by: Lily Kocher Pre-anesthesia Checklist: Patient identified, Emergency Drugs available, Suction available and Patient being monitored Patient Re-evaluated:Patient Re-evaluated prior to inductionOxygen Delivery Method: Circle system utilized Preoxygenation: Pre-oxygenation with 100% oxygen Intubation Type: IV induction Ventilation: Mask ventilation without difficulty and Mask ventilation throughout procedure Airway Equipment and Method: Bite block Placement Confirmation: positive ETCO2 Dental Injury: Teeth and Oropharynx as per pre-operative assessment

## 2015-12-31 NOTE — Anesthesia Postprocedure Evaluation (Signed)
Anesthesia Post Note  Patient: Christian [name]  Procedure(s) Performed: * No procedures listed *  Patient location during evaluation: PACU Anesthesia Type: General Level of consciousness: awake and alert Pain management: pain level controlled Vital Signs Assessment: post-procedure vital signs reviewed and stable Respiratory status: spontaneous breathing, nonlabored ventilation, respiratory function stable and patient connected to nasal cannula oxygen Cardiovascular status: blood pressure returned to baseline and stable Postop Assessment: no signs of nausea or vomiting Anesthetic complications: no    Last Vitals:  Filed Vitals:   12/31/15 1215 12/31/15 1231  BP:  122/98  Pulse: 89 87  Temp: 36.9 C 36.9 C  Resp: 18 15    Last Pain:  Filed Vitals:   12/31/15 1234  PainSc: 0-No pain                 Cleda Mccreedy Piscitello

## 2015-12-31 NOTE — H&P (Signed)
Christian [name] is an 65 y.o. male.   Chief Complaint: Patient has no new complaints. Continues to not eat well not get out of bed much. Getting ECT for treatment of severe depression. Mood is depressed doesn't admit to any psychotic symptoms. HPI: Patient is getting ECT treatment for severe depression which is part of his bipolar disorder and has led to suicidal and now aggressive behavior  Past Medical History  Diagnosis Date  . Mood swings ([place])   . Type II or unspecified type diabetes mellitus with unspecified complication, uncontrolled 06/02/2014  . Other and unspecified hyperlipidemia 06/02/2014  . OSA on CPAP 06/02/2014  . 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 Bipolar disorder ([place])   . Hypertension   . Gout   . Bipolar 1 disorder ([place])   . Hx of blood clots     History reviewed. No pertinent past surgical history.  Family History  Problem Relation Age of Onset  . Dementia Mother   . Arthritis Father   . Hypertension Father   . Heart failure Neg Hx   . Kidney failure Neg Hx   . Cancer Neg Hx   . Mental illness Sister   . Diabetes Sister    Social History:  reports that he has never smoked. He does not have any smokeless tobacco history on file. He reports that he does not drink alcohol or use illicit drugs.  Allergies: No Known Allergies  Medications Prior to Admission  Medication Sig Dispense Refill  . allopurinol (ZYLOPRIM) 300 MG tablet Take 1 tablet (300 mg total) by mouth daily. 30 tablet 0  . apixaban (ELIQUIS) 5 MG TABS tablet Take 1 tablet (5 mg total) by mouth 2 (two) times daily. 60 tablet 0  . atorvastatin (LIPITOR) 10 MG tablet Take 1 tablet (10 mg total) by mouth daily at 6 PM. 30 tablet 0  . clonazePAM (KLONOPIN) 1 MG tablet Take 1 tablet (1 mg total) by mouth at bedtime. 30 tablet 0  . finasteride (PROSCAR) 5 MG tablet Take 1 tablet (5 mg total) by mouth daily. 30 tablet 0  . furosemide (LASIX) 20 MG tablet Take 20  mg by mouth daily.     Marland Kitchen lisinopril (PRINIVIL,ZESTRIL) 40 MG tablet Take 40 mg by mouth daily.    . metFORMIN (GLUCOPHAGE-XR) 500 MG 24 hr tablet Take 500 mg by mouth 2 (two) times daily.    . mirtazapine (REMERON) 45 MG tablet Take 1 tablet (45 mg total) by mouth at bedtime. 30 tablet 0  . OLANZapine (ZYPREXA) 15 MG tablet Take 2 tablets (30 mg total) by mouth at bedtime. 60 tablet 0  . tamsulosin (FLOMAX) 0.4 MG CAPS capsule Take 1 capsule (0.4 mg total) by mouth daily after supper. 30 capsule 0  . aspirin EC 81 MG EC tablet Take 1 tablet (81 mg total) by mouth daily. (Patient not taking: Reported on 12/29/2015) 30 tablet 0  . docusate sodium (COLACE) 100 MG capsule Take 2 capsules (200 mg total) by mouth 2 (two) times daily. (Patient not taking: Reported on 12/29/2015) 60 capsule 0  . polyethylene glycol (MIRALAX / GLYCOLAX) packet Take 17 g by mouth daily. (Patient not taking: Reported on 12/29/2015) 30 each 0    Results for orders placed or performed during the hospital encounter of 12/22/15 (from the past 48 hour(s))  Creatinine, serum     Status: Abnormal   Collection Time: 12/30/15  7:37 PM  Result Value Ref Range  Creatinine, Ser 1.59 (H) 0.61 - 1.24 mg/dL   GFR calc non Af Amer 45 (L) >60 mL/min   GFR calc Af Amer 52 (L) >60 mL/min    Comment: (NOTE) The eGFR has been calculated using the CKD EPI equation. This calculation has not been validated in all clinical situations. eGFR's persistently <60 mL/min signify possible Chronic Kidney Disease.    No results found.  Review of Systems  Constitutional: Negative.   HENT: Negative.   Eyes: Negative.   Respiratory: Negative.   Cardiovascular: Negative.   Gastrointestinal: Negative.   Musculoskeletal: Negative.   Skin: Negative.   Neurological: Negative.   Psychiatric/Behavioral: Positive for depression and memory loss. Negative for suicidal ideas, hallucinations and substance abuse. The patient has insomnia. The patient is not  nervous/anxious.     Blood pressure 111/69, pulse 106, temperature 97.1 F (36.2 C), temperature source Tympanic, resp. rate 18, height 6' (1.829 m), weight 102.059 kg (225 lb), SpO2 100 %. Physical Exam  Nursing note and vitals reviewed. Constitutional: He appears well-developed and well-nourished.  HENT:  Head: Normocephalic and atraumatic.  Eyes: Conjunctivae are normal. Pupils are equal, round, and reactive to light.  Neck: Normal range of motion.  Cardiovascular: Normal heart sounds.   Respiratory: Effort normal.  GI: Soft.  Musculoskeletal: Normal range of motion.  Neurological: He is alert.  Skin: Skin is warm and dry.  Psychiatric: Judgment and thought content normal. His affect is blunt. His speech is delayed. He is slowed and withdrawn. He exhibits a depressed mood. He exhibits abnormal recent memory.     Assessment/Plan Patient continues to be very depressed. We discussed medication changes this morning. Continue bilateral ECT treatment today and next treatment scheduled for Friday.  Alethia Berthold, MD 12/31/2015, 11:28 AM

## 2015-12-31 NOTE — [facility] Group Notes (Signed)
[facility] Group Notes:  (Nursing/MHT/Case Management/Adjunct)  Date:  12/31/2015  Time:  1:54 PM  Type of Therapy:  Psychoeducational Skills  Participation Level:  Did Not Attend  Christian [name] 12/31/2015, 1:54 PM

## 2015-12-31 NOTE — [facility] Group Notes (Signed)
[facility] LCSW Group Therapy  12/31/2015 4:35 PM  Type of Therapy:  Group Therapy  Participation Level:  Did Not Attend  Summary of Progress/Problems: Patient received ECT this morning and did not attend group.  Lulu Riding, MSW, LCSWA 12/31/2015, 4:35 PM

## 2016-01-01 ENCOUNTER — Other Ambulatory Visit: Payer: Self-pay | Admitting: *Deleted

## 2016-01-01 NOTE — Patient Outreach (Signed)
Received a voice message from spouse Nadine 2/1  Requesting to cancel RN CM appointment with pt on 2/8, currently in the hospital.  Spouse to  f/u with RN CM once pt  discharged home.       [name].   Pierzchala RN CCM [facility] Care Management  [phone]

## 2016-01-01 NOTE — Progress Notes (Signed)
D: Patient very concerned about constipation  This am shift . No group participation  Remained in room isolated . Patient able to converse to writer his needs . Using walker to move about on unit. Appetite good . Voice no concerns around depression. Unable to say where he is going at discharge  To live .  A: Encourage patient  Participation and come to staff for any concerns . Instruction given on medication , verbalize understanding . Encourage patient to drink lots of water to facilitate a bowel movement . R: Patient voice no other concerns. Staff continue to monitor

## 2016-01-01 NOTE — Plan of Care (Signed)
Problem: Alteration in mood Goal: LTG-Patient reports reduction in suicidal thoughts (Patient reports reduction in suicidal thoughts and is able to verbalize a safety plan for whenever patient is feeling suicidal)  Outcome: Progressing Pt denies Si at this time     

## 2016-01-01 NOTE — Progress Notes (Signed)
Recreation Therapy Notes  Date: 02.02.17 Time: 3:00 pm Location: Craft Room  Group Topic: Leisure Education  Goal Area(s) Addresses:  Patient will identify things they are grateful for. Patient will identify how being grateful can influence decision making.  Behavioral Response: Did not attend  Intervention: Grateful Wheel  Activity: Patients were given an "I Am Grateful For" worksheet and instructed to list 2-3 things they are grateful for under each category.  Education: LRT educated patients on ways they can put leisure back into their schedule.  Education Outcome: Patient did not attend group.  Clinical Observations/Feedback: Patient did not attend group.  Jacquelynn Cree, LRT/CTRS 01/01/2016 3:40 PM

## 2016-01-01 NOTE — Progress Notes (Signed)
D: Pt denies SI/HI/AVH. Pt is pleasant and cooperative. Pt stated he was doing a little better today due to the medications. Pt spent evening in bed, but did get up and talk with Clinical research associate .   A: Pt was offered support and encouragement. Pt was given scheduled medications. Pt was encourage to attend groups. Q 15 minute checks were done for safety.   R: Pt is taking medication. Pt has no complaints.Pt receptive to treatment and safety maintained on unit.

## 2016-01-01 NOTE — [facility] Group Notes (Signed)
[facility] Group Notes:  (Nursing/MHT/Case Management/Adjunct)  Date:  01/01/2016  Time:  12:47 AM  Type of Therapy:  Group Therapy  Participation Level:  Did Not Attend   Summary of Progress/Problems:  Christian [name] 01/01/2016, 12:47 AM

## 2016-01-01 NOTE — Plan of Care (Signed)
Problem: Ineffective individual coping Goal: LTG: Patient will report a decrease in negative feelings Outcome: Progressing Improving but does not go to group

## 2016-01-01 NOTE — [facility] Group Notes (Signed)
[facility] LCSW Group Therapy  01/01/2016 1:01 PM  Type of Therapy:  Group Therapy  Participation Level:  Did Not Attend  Modes of Intervention:  Discussion, Education, Socialization and Support  Summary of Progress/Problems: Balance in life: Patients will discuss the concept of balance and how it looks and feels to be unbalanced. Pt will identify areas in their life that is unbalanced and ways to become more balanced.    Myrl Bynum L Meris Reede MSW, LCSWA  01/01/2016, 1:01 PM   

## 2016-01-01 NOTE — Progress Notes (Signed)
[facility] MD Progress Note  01/01/2016 7:19 PM Christian [name]  MRN:  [id number] Subjective: Patient states that he is feeling "about the same". Still depressed. Denies suicidal intent or plan. Not going to groups not getting out of his room much. Still not eating very well.  Principal Problem: Bipolar disorder, current episode depressed, severe, with psychotic features ([place]) Diagnosis:   Patient Active Problem List   Diagnosis Date Noted  . Bipolar I disorder, most recent episode depressed, severe w psychosis ([place]) [F31.5] 12/22/2015  . Bipolar affective disorder, depressed, severe, with psychotic behavior ([place]) [F31.5]   . Bipolar I disorder, current or most recent episode depressed, with psychotic features ([place]) [F31.5]   . Bipolar I disorder, most recent episode depressed ([place]) [F31.30]   . Severe bipolar I disorder with depression ([place]) [F31.4]   . Bipolar I disorder, most recent episode depressed, severe with psychotic features ([place]) [F31.5]   . Overdose [T50.901A] 09/11/2015  . Acute respiratory failure with hypoxia ([place]) [J96.01] 09/05/2015  . Elevated troponin [R79.89] 09/05/2015  . Somnolence [R40.0] 09/05/2015  . Bipolar depression ([place]) [F31.30] 09/05/2015  . Acute bilateral deep vein thrombosis (DVT) of femoral veins ([facility]) [I82.413] 09/05/2015  . Bilateral pulmonary embolism ([place]) [I26.99] 09/02/2015  . Labile hypertension [I10]   . Pulmonary embolism with acute cor pulmonale ([place]) [I26.09]   . Dyspnea [R06.00]   . Orthostatic hypotension [I95.1]   . Bipolar disorder, in partial remission, most recent episode depressed ([place]) [F31.75]   . Syncope, near [R55] 08/31/2015  . Affective psychosis, bipolar ([place]) [F31.9]   . Bipolar disorder, now depressed ([place]) [F31.30] 08/28/2015  . Bipolar I disorder, severe, current or most recent episode depressed, with psychotic features ([place]) [F31.5]   . Pressure ulcer [L89.90] 07/30/2015  . Protein-calorie malnutrition, severe ([place]) [E43]  07/30/2015  . Dehydration [E86.0] 07/29/2015  . Bipolar disorder, current episode depressed, severe, with psychotic features ([place]) [F31.5] 07/09/2015  . Diabetes ([place]) [E11.9] 07/09/2015  . UTI (urinary tract infection) [N39.0] 06/19/2014  . Positive blood culture [R78.81] 06/19/2014  . Bipolar affective disorder, current episode manic with psychotic symptoms ([place]) [F31.2] 06/17/2014  . Type II or unspecified type diabetes mellitus with unspecified complication, uncontrolled [E11.8, E11.65] 06/02/2014  . Other and unspecified hyperlipidemia [E78.5] 06/02/2014  . OSA on CPAP [G47.33] 06/02/2014  . 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 Gout [M10.9] 06/02/2014  . Hypertension [I10]    Total Time spent with patient: 30 minutes  Past Psychiatric History: Patient has a long history of bipolar disorder with both depressed and manic phases in the past a history of suicide attempts and now a history of aggression towards his wife. Has responded well to ECT but is very fragile and his illness with a tendency to relapse quickly.  Past Medical History:  Past Medical History  Diagnosis Date  . Mood swings ([place])   . Type II or unspecified type diabetes mellitus with unspecified complication, uncontrolled 06/02/2014  . Other and unspecified hyperlipidemia 06/02/2014  . OSA on CPAP 06/02/2014  . 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 Bipolar disorder ([place])   . Hypertension   . Gout   . Bipolar 1 disorder ([place])   . Hx of blood clots    History reviewed. No pertinent past surgical history. Family History:  Family History  Problem Relation Age of Onset  . Dementia Mother   . Arthritis Father   . Hypertension Father   .  Heart failure Neg Hx   . Kidney failure Neg Hx   . Cancer Neg Hx   . Mental illness Sister   . Diabetes Sister    Family Psychiatric  History: Positive family history for anxiety and bipolar  disorder Social History:  History  Alcohol Use No     History  Drug Use No    Social History   Social History  . Marital Status: Married    Spouse Name: N/A  . Number of Children: N/A  . Years of Education: N/A   Social History Main Topics  . Smoking status: Never Smoker   . Smokeless tobacco: None  . Alcohol Use: No  . Drug Use: No  . Sexual Activity: Not Currently   Other Topics Concern  . None   Social History Narrative   *The patient has been married for over 30 years and is currently on disability. He lives with his wife in the [place] area and has one daughter.             Additional Social History:                         Sleep: Fair  Appetite:  Fair  Current Medications: Current Facility-Administered Medications  Medication Dose Route Frequency Provider Last Rate Last Dose  . acetaminophen (TYLENOL) tablet 650 mg  650 mg Oral Q6H PRN Gonzella Lex, MD   650 mg at 12/23/15 2315  . allopurinol (ZYLOPRIM) tablet 300 mg  300 mg Oral Daily Gonzella Lex, MD   300 mg at 01/01/16 0962  . alum & mag hydroxide-simeth (MAALOX/MYLANTA) 200-200-20 MG/5ML suspension 30 mL  30 mL Oral Q4H PRN Gonzella Lex, MD      . apixaban (ELIQUIS) tablet 5 mg  5 mg Oral BID Gonzella Lex, MD   5 mg at 01/01/16 8366  . aspirin EC tablet 81 mg  81 mg Oral Daily Gonzella Lex, MD   81 mg at 01/01/16 2947  . atorvastatin (LIPITOR) tablet 10 mg  10 mg Oral q1800 Gonzella Lex, MD   10 mg at 01/01/16 1740  . docusate sodium (COLACE) capsule 100 mg  100 mg Oral BID Gonzella Lex, MD   100 mg at 01/01/16 6546  . finasteride (PROSCAR) tablet 5 mg  5 mg Oral Daily Gonzella Lex, MD   5 mg at 01/01/16 5035  . furosemide (LASIX) tablet 20 mg  20 mg Oral Daily Gonzella Lex, MD   20 mg at 01/01/16 4656  . lisinopril (PRINIVIL,ZESTRIL) tablet 40 mg  40 mg Oral Daily Clovis Fredrickson, MD   40 mg at 12/31/15 0637  . magnesium hydroxide (MILK OF MAGNESIA) suspension 30 mL  30  mL Oral Daily PRN Gonzella Lex, MD   30 mL at 01/01/16 0230  . metFORMIN (GLUCOPHAGE) tablet 500 mg  500 mg Oral BID WC Gonzella Lex, MD   500 mg at 01/01/16 1739  . mirtazapine (REMERON) tablet 45 mg  45 mg Oral QHS Gonzella Lex, MD   45 mg at 12/31/15 2227  . polyethylene glycol (MIRALAX / GLYCOLAX) packet 17 g  17 g Oral Daily Gonzella Lex, MD   17 g at 01/01/16 0837  . QUEtiapine (SEROQUEL) tablet 200 mg  200 mg Oral QHS Gonzella Lex, MD   200 mg at 12/31/15 2227  . tamsulosin (FLOMAX) capsule 0.4 mg  0.4 mg Oral  QPC supper Gonzella Lex, MD   0.4 mg at 01/01/16 1740    Lab Results:  Results for orders placed or performed during the hospital encounter of 12/22/15 (from the past 48 hour(s))  Creatinine, serum     Status: Abnormal   Collection Time: 12/30/15  7:37 PM  Result Value Ref Range   Creatinine, Ser 1.59 (H) 0.61 - 1.24 mg/dL   GFR calc non Af Amer 45 (L) >60 mL/min   GFR calc Af Amer 52 (L) >60 mL/min    Comment: (NOTE) The eGFR has been calculated using the CKD EPI equation. This calculation has not been validated in all clinical situations. eGFR's persistently <60 mL/min signify possible Chronic Kidney Disease.   Creatinine, serum     Status: Abnormal   Collection Time: 12/31/15  5:15 AM  Result Value Ref Range   Creatinine, Ser 1.47 (H) 0.61 - 1.24 mg/dL   GFR calc non Af Amer 49 (L) >60 mL/min   GFR calc Af Amer 57 (L) >60 mL/min    Comment: (NOTE) The eGFR has been calculated using the CKD EPI equation. This calculation has not been validated in all clinical situations. eGFR's persistently <60 mL/min signify possible Chronic Kidney Disease.   CBC     Status: None   Collection Time: 12/31/15  5:15 AM  Result Value Ref Range   WBC 3.8 3.8 - 10.6 K/uL   RBC 4.71 4.40 - 5.90 MIL/uL   Hemoglobin 13.9 13.0 - 18.0 g/dL   HCT 42.2 40.0 - 52.0 %   MCV 89.5 80.0 - 100.0 fL   MCH 29.4 26.0 - 34.0 pg   MCHC 32.8 32.0 - 36.0 g/dL   RDW 13.2 11.5 - 14.5 %    Platelets 167 150 - 440 K/uL    Physical Findings: AIMS: Facial and Oral Movements Muscles of Facial Expression: None, normal Lips and Perioral Area: None, normal Jaw: None, normal Tongue: None, normal,Extremity Movements Upper (arms, wrists, hands, fingers): None, normal Lower (legs, knees, ankles, toes): None, normal, Trunk Movements Neck, shoulders, hips: None, normal, Overall Severity Severity of abnormal movements (highest score from questions above): None, normal Incapacitation due to abnormal movements: None, normal Patient's awareness of abnormal movements (rate only patient's report): No Awareness, Dental Status Current problems with teeth and/or dentures?: No Does patient usually wear dentures?: No  CIWA:    COWS:     Musculoskeletal: Strength & Muscle Tone: within normal limits Gait & Station: unsteady Patient leans: N/A  Psychiatric Specialty Exam: Review of Systems  Constitutional: Positive for malaise/fatigue.  HENT: Negative.   Eyes: Negative.   Respiratory: Negative.   Cardiovascular: Negative.   Gastrointestinal: Negative.   Musculoskeletal: Negative.   Skin: Negative.   Neurological: Positive for weakness.  Psychiatric/Behavioral: Positive for depression. Negative for suicidal ideas, hallucinations, memory loss and substance abuse. The patient has insomnia. The patient is not nervous/anxious.     Blood pressure 94/48, pulse 99, temperature 98.1 F (36.7 C), temperature source Oral, resp. rate 18, height 6' (1.829 m), weight 102.059 kg (225 lb), SpO2 99 %.Body mass index is 30.51 kg/(m^2).  General Appearance: Disheveled  Eye Sport and exercise psychologist::  Fair  Speech:  Slow  Volume:  Decreased  Mood:  Dysphoric  Affect:  Flat  Thought Process:  Goal Directed  Orientation:  Full (Time, Place, and Person)  Thought Content:  Rumination  Suicidal Thoughts:  No  Homicidal Thoughts:  No  Memory:  Immediate;   Fair Recent;   Fair Remote;  Fair  Judgement:  Impaired   Insight:  Fair  Psychomotor Activity:  Decreased  Concentration:  Fair  Recall:  [employer] of Knowledge:Fair  Language: Fair  Akathisia:  No  Handed:  Right  AIMS (if indicated):     Assets:  Desire for Improvement Financial Resources/Insurance Housing Resilience Social Support  ADL's:  Impaired  Cognition: Impaired,  Mild  Sleep:  Number of Hours: 3.45   Treatment Plan Summary: Patient not much changed. He did seem to be more awake and alert today possibly because I just caught him when he was up out of bed and talking with his wife. We made some changes to his medicine specifically by adding Seroquel and cutting down on the olanzapine. Next ECT treatment tomorrow morning. Patient still not able to be discharged and disposition plans unclear. Still not sleeping well. Blood pressure is on the low side today but he is not complaining of anything. Sugars reasonably controlled. 01/01/2016, 7:19 PM

## 2016-01-02 ENCOUNTER — Inpatient Hospital Stay: Payer: Commercial Managed Care - HMO | Admitting: Anesthesiology

## 2016-01-02 ENCOUNTER — Encounter: Payer: Self-pay | Admitting: *Deleted

## 2016-01-02 ENCOUNTER — Inpatient Hospital Stay: Payer: Commercial Managed Care - HMO

## 2016-01-02 LAB — GLUCOSE, CAPILLARY
Glucose-Capillary: 167 mg/dL — ABNORMAL HIGH (ref 65–99)
Glucose-Capillary: 171 mg/dL — ABNORMAL HIGH (ref 65–99)

## 2016-01-02 MED ORDER — LABETALOL HCL 5 MG/ML IV SOLN
5.0000 mg | Freq: Once | INTRAVENOUS | Status: AC
  Administered 2016-01-02: 5 mg via INTRAVENOUS

## 2016-01-02 MED ORDER — SUCCINYLCHOLINE CHLORIDE 20 MG/ML IJ SOLN
INTRAMUSCULAR | Status: DC | PRN
  Administered 2016-01-02: 150 mg via INTRAVENOUS

## 2016-01-02 MED ORDER — LABETALOL HCL 5 MG/ML IV SOLN
INTRAVENOUS | Status: DC | PRN
  Administered 2016-01-02: 20 mg via INTRAVENOUS

## 2016-01-02 MED ORDER — LABETALOL HCL 5 MG/ML IV SOLN: 5.0000 mg | INTRAVENOUS | Status: DC | PRN

## 2016-01-02 MED ORDER — QUETIAPINE FUMARATE 200 MG PO TABS
400.0000 mg | ORAL_TABLET | Freq: Every day | ORAL | Status: DC
  Administered 2016-01-02 – 2016-02-02 (×32): 400 mg via ORAL
  Filled 2016-01-02 (×32): qty 2

## 2016-01-02 MED ORDER — KETAMINE HCL 10 MG/ML IJ SOLN
INTRAMUSCULAR | Status: DC | PRN
  Administered 2016-01-02: 120 mg via INTRAVENOUS

## 2016-01-02 MED ORDER — SODIUM CHLORIDE 0.9 % IV SOLN
250.0000 mL | Freq: Once | INTRAVENOUS | Status: AC
  Administered 2016-01-02: 250 mL via INTRAVENOUS

## 2016-01-02 MED ORDER — FENTANYL CITRATE (PF) 100 MCG/2ML IJ SOLN: 25.0000 ug | INTRAMUSCULAR | Status: DC | PRN

## 2016-01-02 MED ORDER — ONDANSETRON HCL 4 MG/2ML IJ SOLN: 4.0000 mg | Freq: Once | INTRAMUSCULAR | Status: DC | PRN

## 2016-01-02 MED ORDER — LABETALOL HCL 5 MG/ML IV SOLN
5.0000 mg | INTRAVENOUS | Status: AC | PRN
  Administered 2016-01-02 (×2): 5 mg via INTRAVENOUS

## 2016-01-02 MED ORDER — SODIUM CHLORIDE 0.9 % IV SOLN
INTRAVENOUS | Status: DC | PRN
  Administered 2016-01-02: 13:00:00 via INTRAVENOUS

## 2016-01-02 NOTE — Tx Team (Signed)
Interdisciplinary Treatment Plan Update (Adult)  Date:  01/02/2016 Time Reviewed:  12:05 PM  Progress in Treatment: Attending groups: No. Participating in groups:  No. Taking medication as prescribed:  Yes. Tolerating medication:  Yes. Family/Significant othe contact made:  Yes, individual(s) contacted:  wife Patient understands diagnosis:  Yes. Discussing patient identified problems/goals with staff:  Yes. Medical problems stabilized or resolved:  Yes. Denies suicidal/homicidal ideation: Yes. Issues/concerns per patient self-inventory:  Yes. Other:  New problem(s) identified: No, Describe:  NA  Discharge Plan or Barriers:Pt unable to return home, lacks funding for group home or ALF placement. Wife is unwilling to have him come home as she feels unsafe. There are no family members at this time that he will be able to stay with. Mrs. Menser's plan is to help him with a hotel/boarding house until other plans can be made if nothing can be done from the hospital.  Reason for Continuation of Hospitalization: Depression Medication stabilization Suicidal ideation Other; describe ECT  Comments:Patient states that he is feeling "about the same". Still depressed. Denies suicidal intent or plan. Not going to groups not getting out of his room much. Still not eating very well.  Estimated length of stay: 7 days   New goal(s):  Review of initial/current patient goals per problem list:   1.  Goal(s): Patient will participate in aftercare plan * Met:  * Target date: at discharge * As evidenced by: Patient will participate within aftercare plan AEB aftercare provider and housing plan at discharge being identified.   2.  Goal (s): Patient will exhibit decreased depressive symptoms and suicidal ideations. * Met:  *  Target date: at discharge * As evidenced by: Patient will utilize self rating of depression at 3 or below and demonstrate decreased signs of depression or be deemed stable for  discharge by MD.   3.  Goal(s): Patient will demonstrate decreased signs and symptoms of anxiety. * Met:  * Target date: at discharge * As evidenced by: Patient will utilize self rating of anxiety at 3 or below and demonstrated decreased signs of anxiety, or be deemed stable for discharge by MD *   Attendees: Patient:  Christian [name] 2/3/201712:05 PM  Family:   2/3/201712:05 PM  Physician:  Dr. Weber Cooks 2/3/201712:05 PM  Nursing:   Junita Push, RN  2/3/201712:05 PM  Case Manager:   2/3/201712:05 PM  Counselor:   2/3/201712:05 PM  Other:  Wray Kearns, [place] 2/3/201712:05 PM  Other:  Everitt Amber, [place]  2/3/201712:05 PM  Other:   2/3/201712:05 PM  Other:  2/3/201712:05 PM  Other:  2/3/201712:05 PM  Other:  2/3/201712:05 PM  Other:  2/3/201712:05 PM  Other:  2/3/201712:05 PM  Other:  2/3/201712:05 PM  Other:   2/3/201712:05 PM   Scribe for Treatment Team:   Wray Kearns, MSW, LCSWA  01/02/2016, 12:05 PM

## 2016-01-02 NOTE — Plan of Care (Signed)
Problem: Alteration in mood Goal: LTG-Pt's behavior demonstrates decreased signs of depression (Patient's behavior demonstrates decreased signs of depression to the point the patient is safe to return home and continue treatment in an outpatient setting)  Outcome: Not Progressing Pt isolates to room, forwards little, and has depressed affect.

## 2016-01-02 NOTE — Anesthesia Procedure Notes (Signed)
Date/Time: 01/02/2016 12:42 PM Performed by: Junious Silk Pre-anesthesia Checklist: Patient identified, Emergency Drugs available, Suction available and Patient being monitored Oxygen Delivery Method: Ambu bag

## 2016-01-02 NOTE — Progress Notes (Signed)
[facility] MD Progress Note  01/02/2016 9:26 PM Christian [name]  MRN:  [id number] Subjective: To my dismay, the patient today seemed to be significantly worse. He told me this morning before ECT that he was feeling worse and he repeated at this afternoon. He was feeling more depressed. He didn't relate any specific physical symptoms. Denied any intention of harming himself but did seem hopeless and even more withdrawn than before.  Principal Problem: Bipolar disorder, current episode depressed, severe, with psychotic features (HCC) Diagnosis:   Patient Active Problem List   Diagnosis Date Noted  . Bipolar I disorder, most recent episode depressed, severe w psychosis (HCC) [F31.5] 12/22/2015  . Bipolar affective disorder, depressed, severe, with psychotic behavior (HCC) [F31.5]   . Bipolar I disorder, current or most recent episode depressed, with psychotic features (HCC) [F31.5]   . Bipolar I disorder, most recent episode depressed (HCC) [F31.30]   . Severe bipolar I disorder with depression (HCC) [F31.4]   . Bipolar I disorder, most recent episode depressed, severe with psychotic features (HCC) [F31.5]   . Overdose [T50.901A] 09/11/2015  . Acute respiratory failure with hypoxia (HCC) [J96.01] 09/05/2015  . Elevated troponin [R79.89] 09/05/2015  . Somnolence [R40.0] 09/05/2015  . Bipolar depression (HCC) [F31.30] 09/05/2015  . Acute bilateral deep vein thrombosis (DVT) of femoral veins (HCC) [I82.413] 09/05/2015  . Bilateral pulmonary embolism (HCC) [I26.99] 09/02/2015  . Labile hypertension [I10]   . Pulmonary embolism with acute cor pulmonale (HCC) [I26.09]   . Dyspnea [R06.00]   . Orthostatic hypotension [I95.1]   . Bipolar disorder, in partial remission, most recent episode depressed (HCC) [F31.75]   . Syncope, near [R55] 08/31/2015  . Affective psychosis, bipolar (HCC) [F31.9]   . Bipolar disorder, now depressed (HCC) [F31.30] 08/28/2015  . Bipolar I disorder, severe, current or most  recent episode depressed, with psychotic features (HCC) [F31.5]   . Pressure ulcer [L89.90] 07/30/2015  . Protein-calorie malnutrition, severe (HCC) [E43] 07/30/2015  . Dehydration [E86.0] 07/29/2015  . Bipolar disorder, current episode depressed, severe, with psychotic features (HCC) [F31.5] 07/09/2015  . Diabetes (HCC) [E11.9] 07/09/2015  . UTI (urinary tract infection) [N39.0] 06/19/2014  . Positive blood culture [R78.81] 06/19/2014  . Bipolar affective disorder, current episode manic with psychotic symptoms (HCC) [F31.2] 06/17/2014  . Type II or unspecified type diabetes mellitus with unspecified complication, uncontrolled [E11.8, E11.65] 06/02/2014  . Other and unspecified hyperlipidemia [E78.5] 06/02/2014  . OSA on CPAP [G47.33] 06/02/2014  . 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 Gout [M10.9] 06/02/2014  . Hypertension [I10]    Total Time spent with patient: 30 minutes  Past Psychiatric History: Patient has a long history of bipolar disorder with both depressed and manic phases in the past a history of suicide attempts and now a history of aggression towards his wife. Has responded well to ECT but is very fragile and his illness with a tendency to relapse quickly.  Past Medical History:  Past Medical History  Diagnosis Date  . Mood swings (HCC)   . Type II or unspecified type diabetes mellitus with unspecified complication, uncontrolled 06/02/2014  . Other and unspecified hyperlipidemia 06/02/2014  . OSA on CPAP 06/02/2014  . 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 Bipolar disorder (HCC)   . Hypertension   . Gout   . Bipolar 1 disorder (HCC)   . Hx of blood clots    History reviewed. No pertinent past surgical history. Family History:  Family History  Problem Relation Age of Onset  . Dementia Mother   . Arthritis Father   . Hypertension Father   . Heart failure Neg Hx   . Kidney  failure Neg Hx   . Cancer Neg Hx   . Mental illness Sister   . Diabetes Sister    Family Psychiatric  History: Positive family history for anxiety and bipolar disorder Social History:  History  Alcohol Use No     History  Drug Use No    Social History   Social History  . Marital Status: Married    Spouse Name: N/A  . Number of Children: N/A  . Years of Education: N/A   Social History Main Topics  . Smoking status: Never Smoker   . Smokeless tobacco: None  . Alcohol Use: No  . Drug Use: No  . Sexual Activity: Not Currently   Other Topics Concern  . None   Social History Narrative   *The patient has been married for over 30 years and is currently on disability. He lives with his wife in the [place] area and has one daughter.             Additional Social History:                         Sleep: Fair  Appetite:  Fair  Current Medications: Current Facility-Administered Medications  Medication Dose Route Frequency Provider Last Rate Last Dose  . acetaminophen (TYLENOL) tablet 650 mg  650 mg Oral Q6H PRN Audery Amel, MD   650 mg at 12/23/15 2315  . allopurinol (ZYLOPRIM) tablet 300 mg  300 mg Oral Daily Audery Amel, MD   300 mg at 01/02/16 1447  . alum & mag hydroxide-simeth (MAALOX/MYLANTA) 200-200-20 MG/5ML suspension 30 mL  30 mL Oral Q4H PRN Audery Amel, MD      . apixaban (ELIQUIS) tablet 5 mg  5 mg Oral BID Audery Amel, MD   5 mg at 01/02/16 2123  . aspirin EC tablet 81 mg  81 mg Oral Daily Audery Amel, MD   81 mg at 01/02/16 1446  . atorvastatin (LIPITOR) tablet 10 mg  10 mg Oral q1800 Audery Amel, MD   10 mg at 01/02/16 1754  . docusate sodium (COLACE) capsule 100 mg  100 mg Oral BID Audery Amel, MD   100 mg at 01/02/16 2123  . fentaNYL (SUBLIMAZE) injection 25 mcg  25 mcg Intravenous Q5 min PRN Berdine Addison, MD      . finasteride (PROSCAR) tablet 5 mg  5 mg Oral Daily Audery Amel, MD   5 mg at 01/02/16 1447  . furosemide  (LASIX) tablet 20 mg  20 mg Oral Daily Audery Amel, MD   20 mg at 01/02/16 1446  . lisinopril (PRINIVIL,ZESTRIL) tablet 40 mg  40 mg Oral Daily Shari Prows, MD   40 mg at 01/02/16 4098  . magnesium hydroxide (MILK OF MAGNESIA) suspension 30 mL  30 mL Oral Daily PRN Audery Amel, MD   30 mL at 01/02/16 2123  . metFORMIN (GLUCOPHAGE) tablet 500 mg  500 mg Oral BID WC Audery Amel, MD   500 mg at 01/02/16 1754  . mirtazapine (REMERON) tablet 45 mg  45 mg Oral QHS Audery Amel, MD   45 mg at 01/02/16 2123  . ondansetron (ZOFRAN) injection 4 mg  4 mg Intravenous Once PRN [facility]  Maisie Fus, MD      . polyethylene glycol (MIRALAX / GLYCOLAX) packet 17 g  17 g Oral Daily Audery Amel, MD   17 g at 01/02/16 1446  . QUEtiapine (SEROQUEL) tablet 400 mg  400 mg Oral QHS Audery Amel, MD   400 mg at 01/02/16 2123  . tamsulosin (FLOMAX) capsule 0.4 mg  0.4 mg Oral QPC supper Audery Amel, MD   0.4 mg at 01/02/16 1754    Lab Results:  No results found for this or any previous visit (from the past 48 hour(s)).  Physical Findings: AIMS: Facial and Oral Movements Muscles of Facial Expression: None, normal Lips and Perioral Area: None, normal Jaw: None, normal Tongue: None, normal,Extremity Movements Upper (arms, wrists, hands, fingers): None, normal Lower (legs, knees, ankles, toes): None, normal, Trunk Movements Neck, shoulders, hips: None, normal, Overall Severity Severity of abnormal movements (highest score from questions above): None, normal Incapacitation due to abnormal movements: None, normal Patient's awareness of abnormal movements (rate only patient's report): No Awareness, Dental Status Current problems with teeth and/or dentures?: No Does patient usually wear dentures?: No  CIWA:    COWS:     Musculoskeletal: Strength & Muscle Tone: within normal limits Gait & Station: unsteady Patient leans: N/A  Psychiatric Specialty Exam: Review of Systems  Constitutional:  Positive for malaise/fatigue.  HENT: Negative.   Eyes: Negative.   Respiratory: Negative.   Cardiovascular: Negative.   Gastrointestinal: Negative.   Musculoskeletal: Negative.   Skin: Negative.   Neurological: Positive for weakness.  Psychiatric/Behavioral: Positive for depression. Negative for suicidal ideas, hallucinations, memory loss and substance abuse. The patient has insomnia. The patient is not nervous/anxious.     Blood pressure 125/86, pulse 82, temperature 97.3 F (36.3 C), temperature source Oral, resp. rate 14, height 6' (1.829 m), weight 102.513 kg (226 lb), SpO2 100 %.Body mass index is 30.64 kg/(m^2).  General Appearance: Disheveled  Eye Solicitor::  Fair  Speech:  Slow  Volume:  Decreased  Mood:  Dysphoric  Affect:  Flat  Thought Process:  Goal Directed  Orientation:  Full (Time, Place, and Person)  Thought Content:  Rumination  Suicidal Thoughts:  No  Homicidal Thoughts:  No  Memory:  Immediate;   Fair Recent;   Fair Remote;   Fair  Judgement:  Impaired  Insight:  Fair  Psychomotor Activity:  Decreased  Concentration:  Fair  Recall:  [employer] of Knowledge:Fair  Language: Fair  Akathisia:  No  Handed:  Right  AIMS (if indicated):     Assets:  Desire for Improvement Financial Resources/Insurance Housing Resilience Social Support  ADL's:  Impaired  Cognition: Impaired,  Mild  Sleep:  Number of Hours: 7.25  Bilateral ECT this morning went well. Patient for whatever reason appears to be retro-progressing. Following the recommendation of his wife I am trying switching his antipsychotic back to Seroquel. Dose will be increased to 400 mg. Once again reviewed with the patient the importance of getting up and getting active. He is on the schedule for ECT on Monday. 01/02/2016, 9:26 PM

## 2016-01-02 NOTE — [facility] Group Notes (Signed)
[facility] Group Notes:  (Nursing/MHT/Case Management/Adjunct)  Date:  01/02/2016  Time:  2:48 PM  Type of Therapy:  Group Therapy  Participation Level:  Did Not Attend  Christian [name] Christian [name] Christian [name] 01/02/2016, 2:48 PM

## 2016-01-02 NOTE — Anesthesia Preprocedure Evaluation (Addendum)
Anesthesia Evaluation  Patient identified by MRN, date of birth, ID band Patient awake    Reviewed: Allergy & Precautions, H&P , NPO status , Patient's Chart, lab work & pertinent test results, Unable to perform ROS - Chart review only  History of Anesthesia Complications Negative for: history of anesthetic complications  Airway Mallampati: IV  TM Distance: >3 FB Neck ROM: Limited    Dental  (+) Missing, Poor Dentition   Pulmonary shortness of breath and with exertion, sleep apnea and Continuous Positive Airway Pressure Ventilation ,    Pulmonary exam normal        Cardiovascular Exercise Tolerance: Poor hypertension, Pt. on medications + Peripheral Vascular Disease  Normal cardiovascular exam     Neuro/Psych PSYCHIATRIC DISORDERS Depression Bipolar Disorder    GI/Hepatic neg GERD  ,  Endo/Other  diabetes, Type 2BG 259 this morning.  Renal/GU      Musculoskeletal   Abdominal (+) + obese,   Peds  Hematology   Anesthesia Other Findings Past Medical History:   Mood swings (HCC)                                            Type II or unspecified type diabetes mellitus * 06/02/2014     Other and unspecified hyperlipidemia            06/02/2014     OSA on CPAP                                     06/02/2014     Bilateral lower extremity edema: chronic with * 06/02/2014     Bipolar disorder (HCC)                                       Hypertension                                                 Gout                                                         Bipolar 1 disorder (HCC)                                     Hx of blood clots                                           BMI    Body Mass Index   31.18 kg/m 2      Reproductive/Obstetrics                             Anesthesia Physical  Anesthesia Plan  ASA: III    Anesthesia Plan: General   Post-op Pain Management:    Induction:  Intravenous  Airway Management Planned: Mask  Additional Equipment:   Intra-op Plan:   Post-operative Plan:   Informed Consent: I have reviewed the patients History and Physical, chart, labs and discussed the procedure including the risks, benefits and alternatives for the proposed anesthesia with the patient or authorized representative who has indicated his/her understanding and acceptance.   Dental advisory given  Plan Discussed with: CRNA, Anesthesiologist and Surgeon  Anesthesia Plan Comments:         Anesthesia Quick Evaluation  

## 2016-01-02 NOTE — [facility] Group Notes (Signed)
[facility] LCSW Aftercare Discharge Planning Group Note   01/02/2016 11:04 AM  Patient did not attend.  Jenel Lucks, CSW Intern 01/02/16

## 2016-01-02 NOTE — Plan of Care (Signed)
Problem: Diagnosis: Increased Risk For Suicide Attempt Goal: LTG-Patient Will Report Improved Mood and Deny Suicidal LTG (by discharge) Patient will report improved mood and deny suicidal ideation.  Outcome: Progressing Denies suicidal ideations.     

## 2016-01-02 NOTE — Anesthesia Postprocedure Evaluation (Signed)
Anesthesia Post Note  Patient: Christian [name]  Procedure(s) Performed: * No procedures listed *  Patient location during evaluation: PACU Anesthesia Type: General Level of consciousness: awake and alert Pain management: pain level controlled Vital Signs Assessment: post-procedure vital signs reviewed and stable Respiratory status: spontaneous breathing, nonlabored ventilation, respiratory function stable and patient connected to nasal cannula oxygen Cardiovascular status: blood pressure returned to baseline and stable Postop Assessment: no signs of nausea or vomiting Anesthetic complications: no    Last Vitals:  Filed Vitals:   01/02/16 1047 01/02/16 1252  BP: 126/90   Pulse: 94 98  Temp: 36.1 C 36.3 C  Resp: 18 21    Last Pain:  Filed Vitals:   01/02/16 1254  PainSc: 0-No pain                 Latresha Yahr S

## 2016-01-02 NOTE — Transfer of Care (Signed)
Immediate Anesthesia Transfer of Care Note  Patient: Christian [name]  Procedure(s) Performed: * No procedures listed *  Patient Location: PACU  Anesthesia Type:General  Level of Consciousness: awake and sedated  Airway & Oxygen Therapy: Patient Spontanous Breathing and Patient connected to face mask oxygen  Post-op Assessment: Report given to RN and Post -op Vital signs reviewed and stable  Post vital signs: Reviewed and stable  Last Vitals:  Filed Vitals:   01/02/16 0702 01/02/16 1047  BP: 108/72 126/90  Pulse: 113 94  Temp: 36.7 C 36.1 C  Resp: 20 18    Complications: No apparent anesthesia complications

## 2016-01-02 NOTE — [facility] Group Notes (Signed)
[facility] LCSW Group Therapy  01/02/2016 2:47 PM  Type of Therapy:  Group Therapy  Participation Level:  Did Not Attend  Modes of Intervention:  Discussion, Education, Socialization and Support  Summary of Progress/Problems: Feelings around Relapse. Group members discussed the meaning of relapse and shared personal stories of relapse, how it affected them and others, and how they perceived themselves during this time. Group members were encouraged to identify triggers, warning signs and coping skills used when facing the possibility of relapse. Social supports were discussed and explored in detail.   Abbi Mancini L Myley Bahner  MSW, LCSWA   01/02/2016, 2:47 PM  

## 2016-01-02 NOTE — H&P (Signed)
Christian [name] is an 65 y.o. male.   Chief Complaint: Patient doesn't have a chief complaint except to acknowledge that he is feeling much worse today. Not sure why. Much more withdrawn. Not communicating. HPI: Patient with severe depression as part of bipolar disorder. Had seemed to be having a little bit of improvement but today seems much withdrawn.  Past Medical History  Diagnosis Date  . Mood swings (HCC)   . Type II or unspecified type diabetes mellitus with unspecified complication, uncontrolled 06/02/2014  . Other and unspecified hyperlipidemia 06/02/2014  . OSA on CPAP 06/02/2014  . 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 Bipolar disorder (HCC)   . Hypertension   . Gout   . Bipolar 1 disorder (HCC)   . Hx of blood clots     History reviewed. No pertinent past surgical history.  Family History  Problem Relation Age of Onset  . Dementia Mother   . Arthritis Father   . Hypertension Father   . Heart failure Neg Hx   . Kidney failure Neg Hx   . Cancer Neg Hx   . Mental illness Sister   . Diabetes Sister    Social History:  reports that he has never smoked. He does not have any smokeless tobacco history on file. He reports that he does not drink alcohol or use illicit drugs.  Allergies: No Known Allergies  Medications Prior to Admission  Medication Sig Dispense Refill  . allopurinol (ZYLOPRIM) 300 MG tablet Take 1 tablet (300 mg total) by mouth daily. 30 tablet 0  . apixaban (ELIQUIS) 5 MG TABS tablet Take 1 tablet (5 mg total) by mouth 2 (two) times daily. 60 tablet 0  . atorvastatin (LIPITOR) 10 MG tablet Take 1 tablet (10 mg total) by mouth daily at 6 PM. 30 tablet 0  . clonazePAM (KLONOPIN) 1 MG tablet Take 1 tablet (1 mg total) by mouth at bedtime. 30 tablet 0  . finasteride (PROSCAR) 5 MG tablet Take 1 tablet (5 mg total) by mouth daily. 30 tablet 0  . furosemide (LASIX) 20 MG tablet Take 20 mg by mouth daily.     Marland Kitchen lisinopril  (PRINIVIL,ZESTRIL) 40 MG tablet Take 40 mg by mouth daily.    . metFORMIN (GLUCOPHAGE-XR) 500 MG 24 hr tablet Take 500 mg by mouth 2 (two) times daily.    . mirtazapine (REMERON) 45 MG tablet Take 1 tablet (45 mg total) by mouth at bedtime. 30 tablet 0  . OLANZapine (ZYPREXA) 15 MG tablet Take 2 tablets (30 mg total) by mouth at bedtime. 60 tablet 0  . tamsulosin (FLOMAX) 0.4 MG CAPS capsule Take 1 capsule (0.4 mg total) by mouth daily after supper. 30 capsule 0  . aspirin EC 81 MG EC tablet Take 1 tablet (81 mg total) by mouth daily. (Patient not taking: Reported on 12/29/2015) 30 tablet 0  . docusate sodium (COLACE) 100 MG capsule Take 2 capsules (200 mg total) by mouth 2 (two) times daily. (Patient not taking: Reported on 12/29/2015) 60 capsule 0  . polyethylene glycol (MIRALAX / GLYCOLAX) packet Take 17 g by mouth daily. (Patient not taking: Reported on 12/29/2015) 30 each 0    No results found for this or any previous visit (from the past 48 hour(s)). No results found.  Review of Systems  Unable to perform ROS: patient unresponsive    Blood pressure 126/90, pulse 94, temperature 96.9 F (36.1 C), temperature source Oral, resp. rate 18, height  6' (1.829 m), weight 102.513 kg (226 lb), SpO2 99 %. Physical Exam  Nursing note and vitals reviewed. Constitutional: He appears well-developed and well-nourished.  HENT:  Head: Normocephalic and atraumatic.  Eyes: Conjunctivae are normal. Pupils are equal, round, and reactive to light.  Neck: Normal range of motion.  Cardiovascular: Normal rate, regular rhythm and normal heart sounds.   Respiratory: Effort normal and breath sounds normal. No respiratory distress.  GI: Soft.  Musculoskeletal: Normal range of motion.  Neurological: He is alert.  Skin: Skin is warm and dry.  Psychiatric: His speech is delayed. He is slowed and withdrawn. Cognition and memory are impaired. He expresses inappropriate judgment. He exhibits a depressed mood.      Assessment/Plan ECT today continue into next week. See other notes for other treatment changes probably going to increase his Seroquel.  [name], MD 01/02/2016, 12:30 PM

## 2016-01-02 NOTE — Procedures (Signed)
[facility] Physician's Interval Evaluation & Treatment Note  Patient Identification: Christian [name] MRN:  [id number] Date of Evaluation:  01/02/2016 TX #: [id number]  MADRS: 44  MMSE: 29  P.E. Findings:  Physical exam no change blood pressure and pulse and temperature normal lungs clear heart normal  Psychiatric Interval Note:  Very withdrawn and uncommunicative. Says he is feeling worse.  Subjective:  Patient is a 65 y.o. male seen for evaluation for Electroconvulsive Therapy. Patient states he is feeling worse although he can't articulate in what way.  Treatment Summary:   []   Right Unilateral             [x]  Bilateral   % Energy : 1.0 ms 100%   Impedance: 870 ohms  Seizure Energy Index: 1869 V squared  Postictal Suppression Index: 82%  Seizure Concordance Index: 99%  Medications  Pre Shock: Labetalol 20 mg, ketamine 120 mg, succinylcholine 150 mg  Post Shock:    Seizure Duration: EMG 25 seconds, EEG 44 seconds   Comments: Continue treatment into next week next treatment Monday the sixth   Lungs:  [x]   Clear to auscultation               []  Other:   Heart:    [x]   Regular rhythm             []  irregular rhythm    [x]   Previous H&P reviewed, patient examined and there are NO CHANGES                 []   Previous H&P reviewed, patient examined and there are changes noted.   [name], MD 2/3/201712:32 PM

## 2016-01-02 NOTE — Progress Notes (Signed)
D: Observed pt in room sleeping. Patient alert and oriented x4. Patient denies SI/HI/AVH. Pt affect is depressed. Pt sleeping very heavily and difficult to awaken fully. Pt woke for nighttime medications, but went immediately back to sleep before any more assessment or prn medications for constipation could be discussed. A: Offered active listening and support. Provided therapeutic communication. Administered scheduled medications.  R: Pt cooperative. Pt medication compliant. Will continue Q15 min. checks. Safety maintained.

## 2016-01-02 NOTE — Progress Notes (Signed)
Recreation Therapy Notes  Date: 02.03.17 Time: 3:00 pm Location: Craft Room  Group Topic: Communication, Problem Solving, Teamwork  Goal Area(s) Addresses:  Patient will work in teams towards shared goal. Patient will verbalize skills needed to make activities successful. Patient will verbalize benefit of using skills identify to reach post d/c goals.  Behavioral Response: Did not attend  Intervention: Landing Pad  Activity: Patients were given 15 straws and about 2.5-3 feet of tape and instructed to build a contraption to catch a golf ball dropped from about 4 feet.  Education: LRT educated patients on why these skills are important.  Education Outcome: Patient did not attend group.  Clinical Observations/Feedback: Patient did not attend group.  Jacquelynn Cree, LRT/CTRS 01/02/2016 4:39 PM

## 2016-01-02 NOTE — Progress Notes (Signed)
Patient tolerated ECT.His mood is still depressed.Stated that his depression is still the same.Denies suicidal ideations.Stayed in bed most of the time.Minimal interactions with peers.Compliant with medications.Appetite good.

## 2016-01-03 NOTE — Progress Notes (Signed)
D: Observed pt in room sleeping. Patient alert and oriented x4. Patient denies SI/HI/AVH. Pt affect is depressed and stated his mood is "down." Pt rates depression 8/10 and anxiety 5/10. Pt indicated his depression has not been improving.  A: Offered active listening and support. Provided therapeutic communication. Administered scheduled medications.  R: Pt cooperative. Pt medication compliant. Will continue Q15 min. checks. Safety maintained.

## 2016-01-03 NOTE — Plan of Care (Signed)
Problem: Diagnosis: Increased Risk For Suicide Attempt Goal: STG-Patient Will Comply With Medication Regime Outcome: Progressing Pt complying with medication regimen.     

## 2016-01-03 NOTE — [facility] Group Notes (Signed)
[facility] LCSW Group Therapy  01/03/2016 3:25 PM  Type of Therapy:  Group Therapy  Participation Level:  Did Not Attend  Modes of Intervention:  Discussion, Education, Socialization and Support  Summary of Progress/Problems: Boundaries: Patients defined boundaries and discussed the importance of them. Patients identified their own boundaries and how they feel when they are crossed. Patients discussed ways to create and/ or improve their personal boundaries.   Mazelle Huebert L Jasmyn Picha MSW, LCSWA  01/03/2016, 3:25 PM  

## 2016-01-03 NOTE — Progress Notes (Signed)
Pt has been pleasant and cooperative.Pt has not attended any unit activities.Pt has been seclusive to his room. Pt's mood and affect has been depressed. Pt denies SI and A/V hallucinations. No post ECT complications noted 
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
## 2016-01-04 LAB — CREATININE, SERUM
Creatinine, Ser: 1.3 mg/dL — ABNORMAL HIGH (ref 0.61–1.24)
GFR calc Af Amer: >60 mL/min (ref >60)
GFR calc non Af Amer: 57 mL/min — ABNORMAL LOW (ref >60)

## 2016-01-04 LAB — CBC
HCT: 39 % — ABNORMAL LOW (ref 40.0–52.0)
Hemoglobin: 13.2 g/dL (ref 13.0–18.0)
MCH: 29.7 pg (ref 26.0–34.0)
MCHC: 33.9 g/dL (ref 32.0–36.0)
MCV: 87.7 fL (ref 80.0–100.0)
Platelets: 151 10*3/uL (ref 150–440)
RBC: 4.45 MIL/uL (ref 4.40–5.90)
RDW: 13 % (ref 11.5–14.5)
WBC: 3.8 10*3/uL (ref 3.8–10.6)

## 2016-01-04 MED ORDER — INSULIN ASPART 100 UNIT/ML ~~LOC~~ SOLN
0.0000 [IU] | Freq: Three times a day (TID) | SUBCUTANEOUS | Status: DC
  Administered 2016-01-04: 2 [IU] via SUBCUTANEOUS
  Administered 2016-01-05: 3 [IU] via SUBCUTANEOUS
  Administered 2016-01-05: 2 [IU] via SUBCUTANEOUS
  Administered 2016-01-06 – 2016-01-10 (×6): 1 [IU] via SUBCUTANEOUS
  Administered 2016-01-12 (×2): 2 [IU] via SUBCUTANEOUS
  Administered 2016-01-13 (×2): 1 [IU] via SUBCUTANEOUS
  Administered 2016-01-14: 2 [IU] via SUBCUTANEOUS
  Administered 2016-01-15 – 2016-01-16 (×2): 1 [IU] via SUBCUTANEOUS
  Administered 2016-01-16 – 2016-01-17 (×2): 2 [IU] via SUBCUTANEOUS
  Administered 2016-01-18 – 2016-01-20 (×4): 1 [IU] via SUBCUTANEOUS
  Administered 2016-01-20: 3 [IU] via SUBCUTANEOUS
  Administered 2016-01-21: 2 [IU] via SUBCUTANEOUS
  Administered 2016-01-21 – 2016-01-22 (×3): 1 [IU] via SUBCUTANEOUS
  Administered 2016-01-22 – 2016-01-23 (×3): 2 [IU] via SUBCUTANEOUS
  Administered 2016-01-23 – 2016-01-26 (×5): 3 [IU] via SUBCUTANEOUS
  Administered 2016-01-26: 1 [IU] via SUBCUTANEOUS
  Administered 2016-01-27: 2 [IU] via SUBCUTANEOUS
  Administered 2016-01-27 – 2016-01-28 (×2): 1 [IU] via SUBCUTANEOUS
  Administered 2016-01-29: 2 [IU] via SUBCUTANEOUS
  Administered 2016-01-30: 1 [IU] via SUBCUTANEOUS
  Administered 2016-01-30: 2 [IU] via SUBCUTANEOUS
  Administered 2016-01-30 – 2016-02-01 (×3): 1 [IU] via SUBCUTANEOUS
  Administered 2016-02-02 – 2016-02-03 (×3): 2 [IU] via SUBCUTANEOUS
  Filled 2016-01-04 (×2): qty 1
  Filled 2016-01-04: qty 2
  Filled 2016-01-04: qty 1
  Filled 2016-01-04: qty 3
  Filled 2016-01-04: qty 1
  Filled 2016-01-04 (×2): qty 2
  Filled 2016-01-04 (×3): qty 1
  Filled 2016-01-04 (×2): qty 2
  Filled 2016-01-04 (×3): qty 1
  Filled 2016-01-04: qty 2
  Filled 2016-01-04 (×2): qty 1
  Filled 2016-01-04: qty 3
  Filled 2016-01-04: qty 1
  Filled 2016-01-04: qty 2
  Filled 2016-01-04 (×4): qty 1
  Filled 2016-01-04 (×2): qty 2
  Filled 2016-01-04 (×4): qty 1
  Filled 2016-01-04 (×5): qty 2
  Filled 2016-01-04 (×3): qty 1
  Filled 2016-01-04 (×2): qty 2
  Filled 2016-01-04: qty 1
  Filled 2016-01-04: qty 2
  Filled 2016-01-04: qty 1

## 2016-01-04 NOTE — [facility] Group Notes (Signed)
[facility] LCSW Group Therapy  01/04/2016 2:40 PM  Type of Therapy:  Group Therapy  Participation Level:  Did Not Attend  Modes of Intervention:  Activity, Education, Exploration, Socialization and Support  Summary of Progress/Problems: Feelings around diagnosis: Patients discussed what mental health means to them and how it has impacted their lives. Patients discussed receiving a diagnosis and how it made them feel. They were encouraged to share experiences related to mental health diagnosis and how other people have reacted.   Ronnika Collett L Randie Bloodgood 01/04/2016, 2:40 PM   

## 2016-01-04 NOTE — Progress Notes (Signed)
Pt has been pleasant and cooperative.Pt has not attended any unit activities.Pt has been seclusive to his room. Pt's mood and affect has been depressed. Pt denies SI and A/V hallucinations. No post ECT complications noted

## 2016-01-04 NOTE — Progress Notes (Signed)
Christian [name] remains sad, and isolative to his room. He was medication compliant although, but he had no interaction with peers and minimal with staff. He appears to be resting in bed at this time.

## 2016-01-04 NOTE — [facility] Group Notes (Signed)
[facility] Group Notes:  (Nursing/MHT/Case Management/Adjunct)  Date:  01/04/2016  Time:  8:30 AM  Type of Therapy:  Community Meeting   Participation Level:  Did Not Attend  Deeric Cruise De'Chelle Kady Toothaker 01/04/2016, 6:04 PM

## 2016-01-04 NOTE — Progress Notes (Signed)
[facility] MD Progress Note  01/04/2016 1:53 PM Christian [name]  MRN:  [id number]    Subjective:   The patient remains sad and isolative to his room. Affect was flat and he was not interacting much with staff or peers. He does come out for meals but has not been attending groups. He denies any current active or passive suicidal thoughts or psychotic symptoms. Per nursing, he did sleep fairly well last night but is also frequently napping during the daytime. He denies any new somatic complaints. He continues to use a walker in order to help with ambulation. His wife did come to visit him yesterday and will come again on Monday he says. He was unable to verbalize any specific triggering factors to depression. Appetite is fairly good and he does come out regularly for meals on the unit.. He does complain of chronic pain bilaterally in his shoulders due to rotator cuff tears.  He is agreeable to continuing with ECT and is hopeful it will improve his mood.  Past Psychiatric History: Patient has a long history of bipolar disorder with both depressed and manic phases in the past a history of suicide attempts and now a history of aggression towards his wife. Has responded well to ECT but is very fragile and his illness with a tendency to relapse quickly.   Social history: The patient was born and raised in  by both his biological parents. 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 He has been married for 35 years and has one daughter. He is currently in disability.  Substance abuse history: The patient denies any history of any heavy alcohol use or illicit drug use   Legal history: The patient denies any prior arrest or incarcerations.   Principal Problem: Bipolar disorder, current episode depressed, severe, with psychotic features (HCC) Diagnosis:   Patient Active Problem List   Diagnosis Date Noted  . Bipolar I disorder, most recent episode depressed, severe w psychosis  (HCC) [F31.5] 12/22/2015  . Bipolar affective disorder, depressed, severe, with psychotic behavior (HCC) [F31.5]   . Bipolar I disorder, current or most recent episode depressed, with psychotic features (HCC) [F31.5]   . Bipolar I disorder, most recent episode depressed (HCC) [F31.30]   . Severe bipolar I disorder with depression (HCC) [F31.4]   . Bipolar I disorder, most recent episode depressed, severe with psychotic features (HCC) [F31.5]   . Overdose [T50.901A] 09/11/2015  . Acute respiratory failure with hypoxia (HCC) [J96.01] 09/05/2015  . Elevated troponin [R79.89] 09/05/2015  . Somnolence [R40.0] 09/05/2015  . Bipolar depression (HCC) [F31.30] 09/05/2015  . Acute bilateral deep vein thrombosis (DVT) of femoral veins (HCC) [I82.413] 09/05/2015  . Bilateral pulmonary embolism (HCC) [I26.99] 09/02/2015  . Labile hypertension [I10]   . Pulmonary embolism with acute cor pulmonale (HCC) [I26.09]   . Dyspnea [R06.00]   . Orthostatic hypotension [I95.1]   . Bipolar disorder, in partial remission, most recent episode depressed (HCC) [F31.75]   . Syncope, near [R55] 08/31/2015  . Affective psychosis, bipolar (HCC) [F31.9]   . Bipolar disorder, now depressed (HCC) [F31.30] 08/28/2015  . Bipolar I disorder, severe, current or most recent episode depressed, with psychotic features (HCC) [F31.5]   . Pressure ulcer [L89.90] 07/30/2015  . Protein-calorie malnutrition, severe (HCC) [E43] 07/30/2015  . Dehydration [E86.0] 07/29/2015  . Bipolar disorder, current episode depressed, severe, with psychotic features (HCC) [F31.5] 07/09/2015  . Diabetes (HCC) [E11.9] 07/09/2015  . UTI (urinary tract infection) [N39.0] 06/19/2014  .   Positive blood culture [R78.81] 06/19/2014  . Bipolar affective disorder, current episode manic with psychotic symptoms ([place]) [F31.2] 06/17/2014  . Type II or unspecified type diabetes mellitus with unspecified complication, uncontrolled [E11.8, E11.65] 06/02/2014  . Other  and unspecified hyperlipidemia [E78.5] 06/02/2014  . OSA on CPAP [G47.33] 06/02/2014  . 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 Gout [M10.9] 06/02/2014  . Hypertension [I10]    Total Time spent with patient: 30 minutes   Past Medical History:  Past Medical History  Diagnosis Date  . Mood swings ([place])   . Type II or unspecified type diabetes mellitus with unspecified complication, uncontrolled 06/02/2014  . Other and unspecified hyperlipidemia 06/02/2014  . OSA on CPAP 06/02/2014  . 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 Bipolar disorder ([place])   . Hypertension   . Gout   . Bipolar 1 disorder ([place])   . Hx of blood clots    History reviewed. No pertinent past surgical history. Family History:  Family History  Problem Relation Age of Onset  . Dementia Mother   . Arthritis Father   . Hypertension Father   . Heart failure Neg Hx   . Kidney failure Neg Hx   . Cancer Neg Hx   . Mental illness Sister   . Diabetes Sister    Family Psychiatric  History: Positive family history for anxiety and bipolar disorder Social History:  History  Alcohol Use No     History  Drug Use No    Social History   Social History  . Marital Status: Married    Spouse Name: N/A  . Number of Children: N/A  . Years of Education: N/A   Social History Main Topics  . Smoking status: Never Smoker   . Smokeless tobacco: None  . Alcohol Use: No  . Drug Use: No  . Sexual Activity: Not Currently   Other Topics Concern  . None   Social History Narrative   *The patient has been married for over 30 years and is currently on disability. He lives with his wife in the [place] area and has one daughter.             Sleep: Fair  Appetite:  Fair  Current Medications: Current Facility-Administered Medications  Medication Dose Route Frequency Provider Last Rate Last Dose  . acetaminophen (TYLENOL) tablet 650 mg   650 mg Oral Q6H PRN Gonzella Lex, MD   650 mg at 12/23/15 2315  . allopurinol (ZYLOPRIM) tablet 300 mg  300 mg Oral Daily Gonzella Lex, MD   300 mg at 01/04/16 0837  . alum & mag hydroxide-simeth (MAALOX/MYLANTA) 200-200-20 MG/5ML suspension 30 mL  30 mL Oral Q4H PRN Gonzella Lex, MD      . apixaban (ELIQUIS) tablet 5 mg  5 mg Oral BID Gonzella Lex, MD   5 mg at 01/04/16 0839  . aspirin EC tablet 81 mg  81 mg Oral Daily Gonzella Lex, MD   81 mg at 01/04/16 4034  . atorvastatin (LIPITOR) tablet 10 mg  10 mg Oral q1800 Gonzella Lex, MD   10 mg at 01/03/16 1738  . docusate sodium (COLACE) capsule 100 mg  100 mg Oral BID Gonzella Lex, MD   100 mg at 01/04/16 7425  . fentaNYL (SUBLIMAZE) injection 25 mcg  25 mcg Intravenous Q5 min PRN Gunnar Bulla, MD      . finasteride (PROSCAR) tablet  5 mg  5 mg Oral Daily John T Clapacs, MD   5 mg at 01/04/16 0837  . furosemide (LASIX) tablet 20 mg  20 mg Oral Daily John T Clapacs, MD   20 mg at 01/04/16 0838  . lisinopril (PRINIVIL,ZESTRIL) tablet 40 mg  40 mg Oral Daily Jolanta B Pucilowska, MD   40 mg at 01/04/16 0837  . magnesium hydroxide (MILK OF MAGNESIA) suspension 30 mL  30 mL Oral Daily PRN John T Clapacs, MD   30 mL at 01/02/16 2123  . metFORMIN (GLUCOPHAGE) tablet 500 mg  500 mg Oral BID WC John T Clapacs, MD   500 mg at 01/04/16 0838  . mirtazapine (REMERON) tablet 45 mg  45 mg Oral QHS John T Clapacs, MD   45 mg at 01/03/16 2137  . ondansetron (ZOFRAN) injection 4 mg  4 mg Intravenous Once PRN Mathai Thomas, MD      . polyethylene glycol (MIRALAX / GLYCOLAX) packet 17 g  17 g Oral Daily John T Clapacs, MD   17 g at 01/04/16 0836  . QUEtiapine (SEROQUEL) tablet 400 mg  400 mg Oral QHS John T Clapacs, MD   400 mg at 01/03/16 2137  . tamsulosin (FLOMAX) capsule 0.4 mg  0.4 mg Oral QPC supper John T Clapacs, MD   0.4 mg at 01/03/16 1738    Lab Results:  Results for orders placed or performed during the hospital encounter of 12/22/15 (from  the past 48 hour(s))  Creatinine, serum     Status: Abnormal   Collection Time: 01/04/16  6:17 AM  Result Value Ref Range   Creatinine, Ser 1.30 (H) 0.61 - 1.24 mg/dL   GFR calc non Af Amer 57 (L) >60 mL/min   GFR calc Af Amer >60 >60 mL/min    Comment: (NOTE) The eGFR has been calculated using the CKD EPI equation. This calculation has not been validated in all clinical situations. eGFR's persistently <60 mL/min signify possible Chronic Kidney Disease.   CBC     Status: Abnormal   Collection Time: 01/04/16  6:17 AM  Result Value Ref Range   WBC 3.8 3.8 - 10.6 K/uL   RBC 4.45 4.40 - 5.90 MIL/uL   Hemoglobin 13.2 13.0 - 18.0 g/dL   HCT 39.0 (L) 40.0 - 52.0 %   MCV 87.7 80.0 - 100.0 fL   MCH 29.7 26.0 - 34.0 pg   MCHC 33.9 32.0 - 36.0 g/dL   RDW 13.0 11.5 - 14.5 %   Platelets 151 150 - 440 K/uL    Physical Findings: AIMS: Facial and Oral Movements Muscles of Facial Expression: None, normal Lips and Perioral Area: None, normal Jaw: None, normal Tongue: None, normal,Extremity Movements Upper (arms, wrists, hands, fingers): None, normal Lower (legs, knees, ankles, toes): None, normal, Trunk Movements Neck, shoulders, hips: None, normal, Overall Severity Severity of abnormal movements (highest score from questions above): None, normal Incapacitation due to abnormal movements: None, normal Patient's awareness of abnormal movements (rate only patient's report): No Awareness, Dental Status Current problems with teeth and/or dentures?: No Does patient usually wear dentures?: No      Musculoskeletal: Strength & Muscle Tone: within normal limits Gait & Station: unsteady Patient leans: N/A  Psychiatric Specialty Exam: Review of Systems  Constitutional: Negative for fever, chills, weight loss and malaise/fatigue.  HENT: Negative.  Negative for ear discharge, ear pain, hearing loss, nosebleeds and tinnitus.   Eyes: Negative.  Negative for blurred vision, double vision,  photophobia, pain and discharge.    Respiratory: Negative.  Negative for cough, hemoptysis, sputum production and shortness of breath.   Cardiovascular: Negative.  Negative for chest pain, palpitations, orthopnea, claudication and leg swelling.  Gastrointestinal: Negative.  Negative for nausea, vomiting, abdominal pain, diarrhea, constipation and blood in stool.  Genitourinary: Negative.  Negative for dysuria, urgency and frequency.  Musculoskeletal: Negative.  Negative for back pain, falls and neck pain.       He has bilateral pain in his shoulders.  Skin: Negative.  Negative for itching and rash.  Neurological: Positive for weakness. Negative for dizziness, tingling, tremors, sensory change, speech change, focal weakness, seizures, loss of consciousness and headaches.       The patient has some weakness and unstable gait and is ambulating with a walker  Endo/Heme/Allergies: Negative for environmental allergies. Does not bruise/bleed easily.  Psychiatric/Behavioral: Positive for depression. Negative for suicidal ideas, hallucinations, memory loss and substance abuse. The patient has insomnia. The patient is not nervous/anxious.     Blood pressure 104/76, pulse 110, temperature 98.6 F (37 C), temperature source Oral, resp. rate 20, height 6' (1.829 m), weight 102.513 kg (226 lb), SpO2 100 %.Body mass index is 30.64 kg/(m^2).  General Appearance: Disheveled  Eye Sport and exercise psychologist::  Fair  Speech:  Slow  Volume:  Decreased  Mood:  Dysphoric  Affect:  Flat  Thought Process:  Goal Directed  Orientation:  Full (Time, Place, and Person)  Thought Content:  Rumination  Suicidal Thoughts:  No  Homicidal Thoughts:  No  Memory:  Immediate;   Fair Recent;   Fair Remote;   Fair  Judgement:  Impaired  Insight:  Fair  Psychomotor Activity:  Decreased  Concentration:  Fair  Recall:  [employer] of Knowledge:Fair  Language: Fair  Akathisia:  No  Handed:  Right  AIMS (if indicated):     Assets:  Desire for  Improvement Financial Resources/Insurance Housing Resilience Social Support  ADL's:  Impaired  Cognition: Impaired,  Mild  Sleep:  Number of Hours: 8    Christian [name] is a 65 year old married African-American male with a history of bipolar disorder, most recent episode depressed. He is currently undergoing ECT treatment with Dr. Weber Cooks.   Bipolar disorder, type I, most recent episode depressed with psychotic behavior: The patient will continue with ECT treatment with Dr. Weber Cooks. He has his next ECT treatment on Monday. Unfortunately, he remains depressed but no current active suicidal thoughts. The patient just recently had an increase in Seroquel 400 mg by mouth nightly. He is also on Remeron 45 mg by mouth nightly for depression.Consider Rexulti if mood does not improve. The patient is currently being treated for both hyperlipidemia and diabetes  Hypertension: The patient will be maintained on Lasix 20 mg by mouth daily, lisinopril 40 mg by mouth daily and Lipitor 10 mg by mouth daily. He is also on aspirin 81 mg by mouth daily Vital signs are stable.  Diabetes: We'll check hemoglobin A1c. The patient is on metformin 500 mg by mouth twice a day. Will start checking blood sugars and SSI placed. He will be maintained on a carbohydrate limited diet  History of DVT: We'll continue on Eliquis was 5 mg by mouth daily  Hyperlipidemia: We'll continue on Lipitor 10 mg by mouth daily  Gout: The patient is currently on allopurinol 300 mg by mouth daily  Disposition: The patient does have a stable living situation. He will follow-up with Dr. Letta Moynahan after discharge  01/04/2016, 1:53 PM

## 2016-01-04 NOTE — Progress Notes (Signed)
D: Pt denies SI/HI/AVH. Pt is pleasant and cooperative, affect is flat and sad does not initiate or engage in a conversation with staff,  he appears less anxious and he is interacting with peers staff appropriately.  A: Pt was offered support and encouragement. Pt was given scheduled medications. Pt was encouraged to attend evening super bowl.  Q 15 minute checks were done for safety.  R:Pt attends evening gathering,  and interacts well with peers and staff. Pt is taking medication. Pt has no complaints.Pt receptive to treatment and safety maintained on unit.

## 2016-01-05 ENCOUNTER — Inpatient Hospital Stay: Payer: Commercial Managed Care - HMO | Admitting: Registered Nurse

## 2016-01-05 ENCOUNTER — Inpatient Hospital Stay: Payer: Commercial Managed Care - HMO

## 2016-01-05 LAB — GLUCOSE, CAPILLARY
Glucose-Capillary: 85 mg/dL (ref 65–99)
Glucose-Capillary: 141 mg/dL — ABNORMAL HIGH (ref 65–99)
Glucose-Capillary: 124 mg/dL — ABNORMAL HIGH (ref 65–99)
Glucose-Capillary: 179 mg/dL — ABNORMAL HIGH (ref 65–99)
Glucose-Capillary: 149 mg/dL — ABNORMAL HIGH (ref 65–99)
Glucose-Capillary: 97 mg/dL (ref 65–99)
Glucose-Capillary: 135 mg/dL — ABNORMAL HIGH (ref 65–99)
Glucose-Capillary: 114 mg/dL — ABNORMAL HIGH (ref 65–99)
Glucose-Capillary: 114 mg/dL — ABNORMAL HIGH (ref 65–99)
Glucose-Capillary: 159 mg/dL — ABNORMAL HIGH (ref 65–99)
Glucose-Capillary: 199 mg/dL — ABNORMAL HIGH (ref 65–99)
Glucose-Capillary: 105 mg/dL — ABNORMAL HIGH (ref 65–99)
Glucose-Capillary: 261 mg/dL — ABNORMAL HIGH (ref 65–99)
Glucose-Capillary: 133 mg/dL — ABNORMAL HIGH (ref 65–99)
Glucose-Capillary: 110 mg/dL — ABNORMAL HIGH (ref 65–99)
Glucose-Capillary: 96 mg/dL (ref 65–99)
Glucose-Capillary: 125 mg/dL — ABNORMAL HIGH (ref 65–99)
Glucose-Capillary: 189 mg/dL — ABNORMAL HIGH (ref 65–99)
Glucose-Capillary: 148 mg/dL — ABNORMAL HIGH (ref 65–99)
Glucose-Capillary: 112 mg/dL — ABNORMAL HIGH (ref 65–99)

## 2016-01-05 MED ORDER — KETAMINE HCL 10 MG/ML IJ SOLN
INTRAMUSCULAR | Status: DC | PRN
  Administered 2016-01-05: 120 mg via INTRAVENOUS

## 2016-01-05 MED ORDER — LABETALOL HCL 5 MG/ML IV SOLN
INTRAVENOUS | Status: DC | PRN
  Administered 2016-01-05: 20 mg via INTRAVENOUS

## 2016-01-05 MED ORDER — SODIUM CHLORIDE 0.9 % IV SOLN
INTRAVENOUS | Status: DC | PRN
  Administered 2016-01-05: 11:00:00 via INTRAVENOUS

## 2016-01-05 MED ORDER — FENTANYL CITRATE (PF) 100 MCG/2ML IJ SOLN: 25.0000 ug | INTRAMUSCULAR | Status: DC | PRN

## 2016-01-05 MED ORDER — SUCCINYLCHOLINE CHLORIDE 20 MG/ML IJ SOLN
INTRAMUSCULAR | Status: DC | PRN
  Administered 2016-01-05: 150 mg via INTRAVENOUS

## 2016-01-05 MED ORDER — SODIUM CHLORIDE 0.9 % IV SOLN
250.0000 mL | Freq: Once | INTRAVENOUS | Status: AC
  Administered 2016-01-05: 500 mL via INTRAVENOUS

## 2016-01-05 MED ORDER — ONDANSETRON HCL 4 MG/2ML IJ SOLN: 4.0000 mg | Freq: Once | INTRAMUSCULAR | Status: DC | PRN

## 2016-01-05 MED ORDER — LITHIUM CARBONATE ER 300 MG PO TBCR
300.0000 mg | EXTENDED_RELEASE_TABLET | Freq: Every day | ORAL | Status: DC
  Administered 2016-01-05 – 2016-01-08 (×4): 300 mg via ORAL
  Filled 2016-01-05 (×4): qty 1

## 2016-01-05 NOTE — [facility] Group Notes (Signed)
[facility] Group Notes:  (Nursing/MHT/Case Management/Adjunct)  Date:  01/05/2016  Time:  1:09 PM  Type of Therapy:  Psychoeducational Skills  Participation Level:  Did Not Attend  Partic  Mickey Farber 01/05/2016, 1:09 PM

## 2016-01-05 NOTE — Anesthesia Postprocedure Evaluation (Signed)
Anesthesia Post Note  Patient: Christian [name]  Procedure(s) Performed: * No procedures listed *  Patient location during evaluation: PACU Anesthesia Type: General Level of consciousness: sedated Pain management: pain level controlled Vital Signs Assessment: post-procedure vital signs reviewed and stable Respiratory status: respiratory function stable Cardiovascular status: stable Anesthetic complications: no    Last Vitals:  Filed Vitals:   01/05/16 1139 01/05/16 1149  BP: 154/106 152/106  Pulse: 105 106  Temp:    Resp: 20 16    Last Pain:  Filed Vitals:   01/05/16 1149  PainSc: 0-No pain                 VAN STAVEREN,Clista Rainford

## 2016-01-05 NOTE — Procedures (Signed)
ECT SERVICES Physician's Interval Evaluation & Treatment Note  Patient Identification: Christian [name] MRN:  [id number] Date of Evaluation:  01/05/2016 TX #: [id number]  MADRS:   MMSE:   P.E. Findings:  Patient's physical state fairly stable lungs and heart clear. Vital signs normal  Psychiatric Interval Note:  Mood getting much worse more depressed more withdrawn.  Subjective:  Patient is a 65 y.o. male seen for evaluation for Electroconvulsive Therapy. Patient feels like giving up  Treatment Summary:   []   Right Unilateral             [x]  Bilateral   % Energy : 1.0 ms 100%   Impedance: 690 ohms  Seizure Energy Index: 2836 V squared  Postictal Suppression Index: 46%  Seizure Concordance Index: 99%  Medications  Pre Shock: Labetalol 20 mg, ketamine 120 mg, succinylcholine 140 mg  Post Shock:    Seizure Duration: 28 seconds by EMG 51 seconds by EEG   Comments: Follow-up treatment Wednesday   Lungs:  [x]   Clear to auscultation               []  Other:   Heart:    [x]   Regular rhythm             []  irregular rhythm    [x]   Previous H&P reviewed, patient examined and there are NO CHANGES                 []   Previous H&P reviewed, patient examined and there are changes noted.   [name], MD 2/6/201711:09 AM

## 2016-01-05 NOTE — Anesthesia Procedure Notes (Signed)
Date/Time: 01/05/2016 11:13 AM Performed by: Stormy Fabian Pre-anesthesia Checklist: Patient identified, Emergency Drugs available, Suction available and Patient being monitored Patient Re-evaluated:Patient Re-evaluated prior to inductionOxygen Delivery Method: Circle system utilized Preoxygenation: Pre-oxygenation with 100% oxygen Intubation Type: IV induction Ventilation: Mask ventilation without difficulty and Mask ventilation throughout procedure Airway Equipment and Method: Bite block Placement Confirmation: positive ETCO2 Dental Injury: Teeth and Oropharynx as per pre-operative assessment

## 2016-01-05 NOTE — [facility] Group Notes (Signed)
[facility] LCSW Aftercare Discharge Planning Group Note   01/05/2016 12:28 PM  Participation Quality:  Patient is scheduled ECT this morning and did not attend group.    Lulu Riding, MSW, LCSWA

## 2016-01-05 NOTE — Progress Notes (Signed)
ECT today, tolerated well.  Alert and oriented x 4.  Denies SI/HI/AVH.  Continues to verbalize depression.  Affect flat.  Medication compliant.  Quiet and isolates to room.  Support and encouragement offered.  Safety maintained.

## 2016-01-05 NOTE — Anesthesia Preprocedure Evaluation (Signed)
Anesthesia Evaluation  Patient identified by MRN, date of birth, ID band Patient awake    Reviewed: Allergy & Precautions, NPO status , Patient's Chart, lab work & pertinent test results  Airway Mallampati: III       Dental  (+) Partial Lower, Partial Upper   Pulmonary shortness of breath and with exertion, sleep apnea ,     + decreased breath sounds      Cardiovascular Exercise Tolerance: Poor hypertension, Pt. on medications + Peripheral Vascular Disease   Rhythm:Regular     Neuro/Psych Depression Bipolar Disorder    GI/Hepatic negative GI ROS, Neg liver ROS,   Endo/Other  diabetes, Type 2, Oral Hypoglycemic AgentsMorbid obesity  Renal/GU Renal InsufficiencyRenal disease     Musculoskeletal negative musculoskeletal ROS (+)   Abdominal (+) + obese,   Peds  Hematology   Anesthesia Other Findings   Reproductive/Obstetrics                             Anesthesia Physical Anesthesia Plan  ASA: III  Anesthesia Plan: General   Post-op Pain Management:    Induction: Intravenous  Airway Management Planned: Mask  Additional Equipment:   Intra-op Plan:   Post-operative Plan:   Informed Consent: I have reviewed the patients History and Physical, chart, labs and discussed the procedure including the risks, benefits and alternatives for the proposed anesthesia with the patient or authorized representative who has indicated his/her understanding and acceptance.     Plan Discussed with: CRNA  Anesthesia Plan Comments:         Anesthesia Quick Evaluation

## 2016-01-05 NOTE — H&P (Signed)
Christian [name] is an 65 y.o. male.   Chief Complaint: Patient continues to feel very depressed and withdrawn. No motivation. Feels like "giving up".. Little physical activity. HPI: Patient with bipolar disorder depressed seems to actually be getting worse. Getting more withdrawn and depressed  Past Medical History  Diagnosis Date  . Mood swings ([place])   . Type II or unspecified type diabetes mellitus with unspecified complication, uncontrolled 06/02/2014  . Other and unspecified hyperlipidemia 06/02/2014  . OSA on CPAP 06/02/2014  . 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 Bipolar disorder ([place])   . Hypertension   . Gout   . Bipolar 1 disorder ([place])   . Hx of blood clots     History reviewed. No pertinent past surgical history.  Family History  Problem Relation Age of Onset  . Dementia Mother   . Arthritis Father   . Hypertension Father   . Heart failure Neg Hx   . Kidney failure Neg Hx   . Cancer Neg Hx   . Mental illness Sister   . Diabetes Sister    Social History:  reports that he has never smoked. He does not have any smokeless tobacco history on file. He reports that he does not drink alcohol or use illicit drugs.  Allergies: No Known Allergies  Medications Prior to Admission  Medication Sig Dispense Refill  . allopurinol (ZYLOPRIM) 300 MG tablet Take 1 tablet (300 mg total) by mouth daily. 30 tablet 0  . apixaban (ELIQUIS) 5 MG TABS tablet Take 1 tablet (5 mg total) by mouth 2 (two) times daily. 60 tablet 0  . atorvastatin (LIPITOR) 10 MG tablet Take 1 tablet (10 mg total) by mouth daily at 6 PM. 30 tablet 0  . clonazePAM (KLONOPIN) 1 MG tablet Take 1 tablet (1 mg total) by mouth at bedtime. 30 tablet 0  . finasteride (PROSCAR) 5 MG tablet Take 1 tablet (5 mg total) by mouth daily. 30 tablet 0  . furosemide (LASIX) 20 MG tablet Take 20 mg by mouth daily.     Marland Kitchen lisinopril (PRINIVIL,ZESTRIL) 40 MG tablet Take 40 mg by mouth daily.     . metFORMIN (GLUCOPHAGE-XR) 500 MG 24 hr tablet Take 500 mg by mouth 2 (two) times daily.    . mirtazapine (REMERON) 45 MG tablet Take 1 tablet (45 mg total) by mouth at bedtime. 30 tablet 0  . OLANZapine (ZYPREXA) 15 MG tablet Take 2 tablets (30 mg total) by mouth at bedtime. 60 tablet 0  . tamsulosin (FLOMAX) 0.4 MG CAPS capsule Take 1 capsule (0.4 mg total) by mouth daily after supper. 30 capsule 0  . aspirin EC 81 MG EC tablet Take 1 tablet (81 mg total) by mouth daily. (Patient not taking: Reported on 12/29/2015) 30 tablet 0  . docusate sodium (COLACE) 100 MG capsule Take 2 capsules (200 mg total) by mouth 2 (two) times daily. (Patient not taking: Reported on 12/29/2015) 60 capsule 0  . polyethylene glycol (MIRALAX / GLYCOLAX) packet Take 17 g by mouth daily. (Patient not taking: Reported on 12/29/2015) 30 each 0    Results for orders placed or performed during the hospital encounter of 12/22/15 (from the past 48 hour(s))  Creatinine, serum     Status: Abnormal   Collection Time: 01/04/16  6:17 AM  Result Value Ref Range   Creatinine, Ser 1.30 (H) 0.61 - 1.24 mg/dL   GFR calc non Af Amer 57 (L) >60 mL/min   GFR  calc Af Amer >60 >60 mL/min    Comment: (NOTE) The eGFR has been calculated using the CKD EPI equation. This calculation has not been validated in all clinical situations. eGFR's persistently <60 mL/min signify possible Chronic Kidney Disease.   CBC     Status: Abnormal   Collection Time: 01/04/16  6:17 AM  Result Value Ref Range   WBC 3.8 3.8 - 10.6 K/uL   RBC 4.45 4.40 - 5.90 MIL/uL   Hemoglobin 13.2 13.0 - 18.0 g/dL   HCT 39.0 (L) 40.0 - 52.0 %   MCV 87.7 80.0 - 100.0 fL   MCH 29.7 26.0 - 34.0 pg   MCHC 33.9 32.0 - 36.0 g/dL   RDW 13.0 11.5 - 14.5 %   Platelets 151 150 - 440 K/uL   No results found.  Review of Systems  Constitutional: Negative.   HENT: Negative.   Eyes: Negative.   Respiratory: Negative.   Cardiovascular: Negative.   Gastrointestinal:  Negative.   Musculoskeletal: Negative.   Skin: Negative.   Neurological: Negative.   Psychiatric/Behavioral: Positive for depression and memory loss. Negative for suicidal ideas, hallucinations and substance abuse. The patient has insomnia. The patient is not nervous/anxious.     Blood pressure 117/84, pulse 101, temperature 96.9 F (36.1 C), temperature source Oral, resp. rate 16, height 6' (1.829 m), weight 103.42 kg (228 lb), SpO2 100 %. Physical Exam  Nursing note and vitals reviewed. Constitutional: He appears well-developed and well-nourished.  HENT:  Head: Normocephalic and atraumatic.  Eyes: Conjunctivae are normal. Pupils are equal, round, and reactive to light.  Neck: Normal range of motion.  Cardiovascular: Normal rate, regular rhythm and normal heart sounds.   Respiratory: Effort normal and breath sounds normal. No respiratory distress.  GI: Soft.  Musculoskeletal: Normal range of motion.  Neurological: He is alert.  Skin: Skin is warm and dry.  Psychiatric: His affect is blunt. His speech is delayed. He is slowed and withdrawn. Cognition and memory are impaired. He expresses inappropriate judgment. He exhibits a depressed mood. He expresses suicidal ideation. He expresses no suicidal plans. He is noncommunicative.     Assessment/Plan Patient with depression receiving bilateral ECT. Currently without clearcut improvement. Continue bilateral treatment while we are trying to find something to get him back to improvement.  Alethia Berthold, MD 01/05/2016, 11:07 AM

## 2016-01-05 NOTE — Progress Notes (Signed)
Nutrition Follow-up    INTERVENTION:   Meals and Snacks: Cater to patient preferences Medical Food Supplement Therapy: continue Sugar Free Mighty Shakes  NUTRITION DIAGNOSIS:   Inadequate oral intake related to acute illness as evidenced by energy intake < 75% for > 7 days. Improved  GOAL:   Patient will meet greater than or equal to 90% of their needs  MONITOR:    (Energy intake, Glucose profile)  REASON FOR ASSESSMENT:   LOS    ASSESSMENT:     Receiving ECT  Diet Order:  Diet Carb Modified Fluid consistency:: Thin; Room service appropriate?: Yes Diet NPO time specified  Energy Intake: po intake improved, eating 90-100% of meals per I/O; also receiving Mighty Shake supplement  Electrolyte and Renal Profile:  Recent Labs Lab 12/30/15 1937 12/31/15 0515 01/04/16 0617  CREATININE 1.59* 1.47* 1.30*   Glucose Profile:  Recent Labs  01/04/16 1643 01/04/16 2025 01/05/16 0634  GLUCAP 189* 148* 112*   Meds: lasix, ss novolog, glucophage, remeron  Height:   Ht Readings from Last 1 Encounters:  01/05/16 6' (1.829 m)    Weight: weight trending up per weight encounters  Wt Readings from Last 1 Encounters:  01/05/16 228 lb (103.42 kg)    Filed Weights   12/31/15 0908 01/02/16 1047 01/05/16 0917  Weight: 225 lb (102.059 kg) 226 lb (102.513 kg) 228 lb (103.42 kg)    BMI:  Body mass index is 30.92 kg/(m^2).  Estimated Nutritional Needs:   Kcal:  (25-30 kcals/kg)2000-2400 kcals/d (Using IBW of 80kg)  Protein:  0.8-1.0 g/kg) 64-80 gm/d  Fluid:  (2000-245ml/d  EDUCATION NEEDS:   No education needs identified at this time  LOW Care Level  Romelle Starcher MS, RD, LDN [phone] Pager  [phone] Weekend/On-Call Pager

## 2016-01-05 NOTE — Progress Notes (Signed)
[facility] MD Progress Note  01-13-16 6:18 PM Christian [name]  MRN:  [id number] Subjective:  Follow-up for 66 year old man with a history of bipolar disorder depressed. Has been receiving ECT. Despite 3 times a week bilateral ECT his mood has been getting worse. He had treatment today and tolerated it well. This evening he reports that he is still feeling bad and now tells me he is having thoughts of wishing he were dead. [facility] MD Progress Note  01/13/16 6:21 PM Christian [name]  MRN:  [id number]    Subjective:   The patient remains sad and isolative to his room. Affect was flat and he was not interacting much with staff or peers. He does come out for meals but has not been attending groups. He denies any current active or passive suicidal thoughts or psychotic symptoms. Per nursing, he did sleep fairly well last night but is also frequently napping during the daytime. He denies any new somatic complaints. He continues to use a walker in order to help with ambulation. His wife did come to visit him yesterday and will come again on Monday he says. He was unable to verbalize any specific triggering factors to depression. Appetite is fairly good and he does come out regularly for meals on the unit.Marland Kitchen He does complain of chronic pain bilaterally in his shoulders due to rotator cuff tears.  He is agreeable to continuing with ECT and is hopeful it will improve his mood.  Past Psychiatric History: Patient has a long history of bipolar disorder with both depressed and manic phases in the past a history of suicide attempts and now a history of aggression towards his wife. Has responded well to ECT but is very fragile and his illness with a tendency to relapse quickly.   Social history: The patient was born and raised in [place] by both his biological parents. 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 He has been married for 98 years and has one daughter. He is currently in  disability.  Substance abuse history: The patient denies any history of any heavy alcohol use or illicit drug use   Legal history: The patient denies any prior arrest or incarcerations.   Principal Problem: Bipolar disorder, current episode depressed, severe, with psychotic features ([place]) Diagnosis:   Patient Active Problem List   Diagnosis Date Noted  . Bipolar I disorder, most recent episode depressed, severe w psychosis ([place]) [F31.5] 12/22/2015  . Bipolar affective disorder, depressed, severe, with psychotic behavior ([place]) [F31.5]   . Bipolar I disorder, current or most recent episode depressed, with psychotic features ([place]) [F31.5]   . Bipolar I disorder, most recent episode depressed ([place]) [F31.30]   . Severe bipolar I disorder with depression ([place]) [F31.4]   . Bipolar I disorder, most recent episode depressed, severe with psychotic features ([employer]) [F31.5]   . Overdose [T50.901A] 09/11/2015  . Acute respiratory failure with hypoxia ([place]) [J96.01] 09/05/2015  . Elevated troponin [R79.89] 09/05/2015  . Somnolence [R40.0] 09/05/2015  . Bipolar depression ([place]) [F31.30] 09/05/2015  . Acute bilateral deep vein thrombosis (DVT) of femoral veins (HCC) [I82.413] 09/05/2015  . Bilateral pulmonary embolism ([place]) [I26.99] 09/02/2015  . Labile hypertension [I10]   . Pulmonary embolism with acute cor pulmonale ([place]) [I26.09]   . Dyspnea [R06.00]   . Orthostatic hypotension [I95.1]   . Bipolar disorder, in partial remission, most recent episode depressed ([place]) [F31.75]   . Syncope, near [R55] 08/31/2015  . Affective psychosis, bipolar ([place]) [  F31.9]   . Bipolar disorder, now depressed ([place]) [F31.30] 08/28/2015  . Bipolar I disorder, severe, current or most recent episode depressed, with psychotic features ([place]) [F31.5]   . Pressure ulcer [L89.90] 07/30/2015  . Protein-calorie malnutrition, severe ([place]) [E43] 07/30/2015  . Dehydration [E86.0] 07/29/2015  . Bipolar disorder, current episode  depressed, severe, with psychotic features ([place]) [F31.5] 07/09/2015  . Diabetes ([place]) [E11.9] 07/09/2015  . UTI (urinary tract infection) [N39.0] 06/19/2014  . Positive blood culture [R78.81] 06/19/2014  . Bipolar affective disorder, current episode manic with psychotic symptoms ([place]) [F31.2] 06/17/2014  . Type II or unspecified type diabetes mellitus with unspecified complication, uncontrolled [E11.8, E11.65] 06/02/2014  . Other and unspecified hyperlipidemia [E78.5] 06/02/2014  . OSA on CPAP [G47.33] 06/02/2014  . 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 Gout [M10.9] 06/02/2014  . Hypertension [I10]    Total Time spent with patient: 30 minutes   Past Medical History:  Past Medical History  Diagnosis Date  . Mood swings ([place])   . Type II or unspecified type diabetes mellitus with unspecified complication, uncontrolled 06/02/2014  . Other and unspecified hyperlipidemia 06/02/2014  . OSA on CPAP 06/02/2014  . 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 Bipolar disorder ([place])   . Hypertension   . Gout   . Bipolar 1 disorder ([place])   . Hx of blood clots    History reviewed. No pertinent past surgical history. Family History:  Family History  Problem Relation Age of Onset  . Dementia Mother   . Arthritis Father   . Hypertension Father   . Heart failure Neg Hx   . Kidney failure Neg Hx   . Cancer Neg Hx   . Mental illness Sister   . Diabetes Sister    Family Psychiatric  History: Positive family history for anxiety and bipolar disorder Social History:  History  Alcohol Use No     History  Drug Use No    Social History   Social History  . Marital Status: Married    Spouse Name: N/A  . Number of Children: N/A  . Years of Education: N/A   Social History Main Topics  . Smoking status: Never Smoker   . Smokeless tobacco: None  . Alcohol Use: No  . Drug Use: No  . Sexual Activity: Not  Currently   Other Topics Concern  . None   Social History Narrative   *The patient has been married for over 30 years and is currently on disability. He lives with his wife in the [place] area and has one daughter.             Sleep: Fair  Appetite:  Fair  Current Medications: Current Facility-Administered Medications  Medication Dose Route Frequency Provider Last Rate Last Dose  . acetaminophen (TYLENOL) tablet 650 mg  650 mg Oral Q6H PRN Gonzella Lex, MD   650 mg at 12/23/15 2315  . allopurinol (ZYLOPRIM) tablet 300 mg  300 mg Oral Daily Gonzella Lex, MD   300 mg at 01/05/16 1358  . alum & mag hydroxide-simeth (MAALOX/MYLANTA) 200-200-20 MG/5ML suspension 30 mL  30 mL Oral Q4H PRN Gonzella Lex, MD      . apixaban (ELIQUIS) tablet 5 mg  5 mg Oral BID Gonzella Lex, MD   5 mg at 01/05/16 1358  . aspirin EC tablet 81 mg  81 mg Oral Daily Gonzella Lex, MD  81 mg at 01/05/16 1358  . atorvastatin (LIPITOR) tablet 10 mg  10 mg Oral q1800 Gonzella Lex, MD   10 mg at 01/05/16 1641  . docusate sodium (COLACE) capsule 100 mg  100 mg Oral BID Gonzella Lex, MD   100 mg at 01/05/16 1358  . fentaNYL (SUBLIMAZE) injection 25 mcg  25 mcg Intravenous Q5 min PRN Gunnar Bulla, MD      . fentaNYL (SUBLIMAZE) injection 25 mcg  25 mcg Intravenous Q5 min PRN Gijsbertus Lonia Mad, MD      . finasteride (PROSCAR) tablet 5 mg  5 mg Oral Daily Gonzella Lex, MD   5 mg at 01/05/16 1358  . furosemide (LASIX) tablet 20 mg  20 mg Oral Daily Gonzella Lex, MD   20 mg at 01/05/16 1358  . insulin aspart (novoLOG) injection 0-9 Units  0-9 Units Subcutaneous TID WC Chauncey Mann, MD   3 Units at 01/05/16 1642  . lisinopril (PRINIVIL,ZESTRIL) tablet 40 mg  40 mg Oral Daily Clovis Fredrickson, MD   40 mg at 01/05/16 0743  . lithium carbonate (LITHOBID) CR tablet 300 mg  300 mg Oral QHS Gonzella Lex, MD      . magnesium hydroxide (MILK OF MAGNESIA) suspension 30 mL  30 mL Oral Daily PRN Gonzella Lex, MD   30 mL at 01/02/16 2123  . metFORMIN (GLUCOPHAGE) tablet 500 mg  500 mg Oral BID WC Gonzella Lex, MD   500 mg at 01/05/16 1641  . mirtazapine (REMERON) tablet 45 mg  45 mg Oral QHS Gonzella Lex, MD   45 mg at 01/04/16 2235  . ondansetron (ZOFRAN) injection 4 mg  4 mg Intravenous Once PRN Gunnar Bulla, MD      . ondansetron [facility]) injection 4 mg  4 mg Intravenous Once PRN Gijsbertus F Boston Service, MD      . polyethylene glycol (MIRALAX / GLYCOLAX) packet 17 g  17 g Oral Daily Gonzella Lex, MD   17 g at 01/05/16 1357  . QUEtiapine (SEROQUEL) tablet 400 mg  400 mg Oral QHS Gonzella Lex, MD   400 mg at 01/04/16 2235  . tamsulosin (FLOMAX) capsule 0.4 mg  0.4 mg Oral QPC supper Gonzella Lex, MD   0.4 mg at 01/04/16 1705    Lab Results:  Results for orders placed or performed during the hospital encounter of 12/22/15 (from the past 48 hour(s))  Creatinine, serum     Status: Abnormal   Collection Time: 01/04/16  6:17 AM  Result Value Ref Range   Creatinine, Ser 1.30 (H) 0.61 - 1.24 mg/dL   GFR calc non Af Amer 57 (L) >60 mL/min   GFR calc Af Amer >60 >60 mL/min    Comment: (NOTE) The eGFR has been calculated using the CKD EPI equation. This calculation has not been validated in all clinical situations. eGFR's persistently <60 mL/min signify possible Chronic Kidney Disease.   CBC     Status: Abnormal   Collection Time: 01/04/16  6:17 AM  Result Value Ref Range   WBC 3.8 3.8 - 10.6 K/uL   RBC 4.45 4.40 - 5.90 MIL/uL   Hemoglobin 13.2 13.0 - 18.0 g/dL   HCT 39.0 (L) 40.0 - 52.0 %   MCV 87.7 80.0 - 100.0 fL   MCH 29.7 26.0 - 34.0 pg   MCHC 33.9 32.0 - 36.0 g/dL   RDW 13.0 11.5 - 14.5 %  Platelets 151 150 - 440 K/uL    Physical Findings: AIMS: Facial and Oral Movements Muscles of Facial Expression: None, normal Lips and Perioral Area: None, normal Jaw: None, normal Tongue: None, normal,Extremity Movements Upper (arms, wrists, hands, fingers): None,  normal Lower (legs, knees, ankles, toes): None, normal, Trunk Movements Neck, shoulders, hips: None, normal, Overall Severity Severity of abnormal movements (highest score from questions above): None, normal Incapacitation due to abnormal movements: None, normal Patient's awareness of abnormal movements (rate only patient's report): No Awareness, Dental Status Current problems with teeth and/or dentures?: No Does patient usually wear dentures?: No      Musculoskeletal: Strength & Muscle Tone: within normal limits Gait & Station: unsteady Patient leans: N/A  Psychiatric Specialty Exam: Review of Systems  Constitutional: Negative for fever, chills, weight loss and malaise/fatigue.  HENT: Negative.  Negative for ear discharge, ear pain, hearing loss, nosebleeds and tinnitus.   Eyes: Negative.  Negative for blurred vision, double vision, photophobia, pain and discharge.  Respiratory: Negative.  Negative for cough, hemoptysis, sputum production and shortness of breath.   Cardiovascular: Negative.  Negative for chest pain, palpitations, orthopnea, claudication and leg swelling.  Gastrointestinal: Negative.  Negative for nausea, vomiting, abdominal pain, diarrhea, constipation and blood in stool.  Genitourinary: Negative.  Negative for dysuria, urgency and frequency.  Musculoskeletal: Negative.  Negative for back pain, falls and neck pain.       He has bilateral pain in his shoulders.  Skin: Negative.  Negative for itching and rash.  Neurological: Positive for weakness. Negative for dizziness, tingling, tremors, sensory change, speech change, focal weakness, seizures, loss of consciousness and headaches.       The patient has some weakness and unstable gait and is ambulating with a walker  Endo/Heme/Allergies: Negative for environmental allergies. Does not bruise/bleed easily.  Psychiatric/Behavioral: Positive for depression. Negative for suicidal ideas, hallucinations, memory loss and  substance abuse. The patient has insomnia. The patient is not nervous/anxious.     Blood pressure 136/84, pulse 95, temperature 98 F (36.7 C), temperature source Oral, resp. rate 18, height 6' (1.829 m), weight 103.42 kg (228 lb), SpO2 100 %.Body mass index is 30.92 kg/(m^2).  General Appearance: Disheveled  Eye Sport and exercise psychologist::  Fair  Speech:  Slow  Volume:  Decreased  Mood:  Dysphoric  Affect:  Flat  Thought Process:  Goal Directed  Orientation:  Full (Time, Place, and Person)  Thought Content:  Rumination  Suicidal Thoughts:  No  Homicidal Thoughts:  No  Memory:  Immediate;   Fair Recent;   Fair Remote;   Fair  Judgement:  Impaired  Insight:  Fair  Psychomotor Activity:  Decreased  Concentration:  Fair  Recall:  [employer] of Knowledge:Fair  Language: Fair  Akathisia:  No  Handed:  Right  AIMS (if indicated):     Assets:  Desire for Improvement Financial Resources/Insurance Housing Resilience Social Support  ADL's:  Impaired  Cognition: Impaired,  Mild  Sleep:  Number of Hours: 7    Mr. Seiler is a 65 year old married African-American male with a history of bipolar disorder, most recent episode depressed. He is currently undergoing ECT treatment with Dr. Weber Cooks.   Bipolar disorder, type I, most recent episode depressed with psychotic behavior: The patient will continue with ECT treatment with Dr. Weber Cooks. He has his next ECT treatment on Monday. Unfortunately, he remains depressed but no current active suicidal thoughts. The patient just recently had an increase in Seroquel 400 mg by mouth nightly. He is  also on Remeron 45 mg by mouth nightly for depression.Consider Rexulti if mood does not improve. The patient is currently being treated for both hyperlipidemia and diabetes  Hypertension: The patient will be maintained on Lasix 20 mg by mouth daily, lisinopril 40 mg by mouth daily and Lipitor 10 mg by mouth daily. He is also on aspirin 81 mg by mouth daily Vital signs are  stable.  Diabetes: We'll check hemoglobin A1c. The patient is on metformin 500 mg by mouth twice a day. Will start checking blood sugars and SSI placed. He will be maintained on a carbohydrate limited diet  History of DVT: We'll continue on Eliquis was 5 mg by mouth daily  Hyperlipidemia: We'll continue on Lipitor 10 mg by mouth daily  Gout: The patient is currently on allopurinol 300 mg by mouth daily  Disposition: The patient does have a stable living situation. He will follow-up with Dr. Letta Moynahan after discharge  01/05/2016, 6:21 PM  Principal Problem: Bipolar disorder, current episode depressed, severe, with psychotic features [facility]) Diagnosis:   Patient Active Problem List   Diagnosis Date Noted  . Bipolar I disorder, most recent episode depressed, severe w psychosis ([place]) [F31.5] 12/22/2015  . Bipolar affective disorder, depressed, severe, with psychotic behavior ([place]) [F31.5]   . Bipolar I disorder, current or most recent episode depressed, with psychotic features ([place]) [F31.5]   . Bipolar I disorder, most recent episode depressed ([place]) [F31.30]   . Severe bipolar I disorder with depression ([place]) [F31.4]   . Bipolar I disorder, most recent episode depressed, severe with psychotic features ([place]) [F31.5]   . Overdose [T50.901A] 09/11/2015  . Acute respiratory failure with hypoxia ([place]) [J96.01] 09/05/2015  . Elevated troponin [R79.89] 09/05/2015  . Somnolence [R40.0] 09/05/2015  . Bipolar depression ([place]) [F31.30] 09/05/2015  . Acute bilateral deep vein thrombosis (DVT) of femoral veins (HCC) [I82.413] 09/05/2015  . Bilateral pulmonary embolism ([place]) [I26.99] 09/02/2015  . Labile hypertension [I10]   . Pulmonary embolism with acute cor pulmonale ([place]) [I26.09]   . Dyspnea [R06.00]   . Orthostatic hypotension [I95.1]   . Bipolar disorder, in partial remission, most recent episode depressed ([place]) [F31.75]   . Syncope, near [R55] 08/31/2015  . Affective psychosis, bipolar  ([place]) [F31.9]   . Bipolar disorder, now depressed ([place]) [F31.30] 08/28/2015  . Bipolar I disorder, severe, current or most recent episode depressed, with psychotic features ([place]) [F31.5]   . Pressure ulcer [L89.90] 07/30/2015  . Protein-calorie malnutrition, severe ([place]) [E43] 07/30/2015  . Dehydration [E86.0] 07/29/2015  . Bipolar disorder, current episode depressed, severe, with psychotic features ([place]) [F31.5] 07/09/2015  . Diabetes ([place]) [E11.9] 07/09/2015  . UTI (urinary tract infection) [N39.0] 06/19/2014  . Positive blood culture [R78.81] 06/19/2014  . Bipolar affective disorder, current episode manic with psychotic symptoms ([place]) [F31.2] 06/17/2014  . Type II or unspecified type diabetes mellitus with unspecified complication, uncontrolled [E11.8, E11.65] 06/02/2014  . Other and unspecified hyperlipidemia [E78.5] 06/02/2014  . OSA on CPAP [G47.33] 06/02/2014  . 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 Gout [M10.9] 06/02/2014  . Hypertension [I10]    Total Time spent with patient: 30 minutes  Past Psychiatric History: past history of severe major depression as part of bipolar disorder also past history of manic episodes. Depression getting worse in the past couple years. 3 of several suicide attempts  Past Medical History:  Past Medical History  Diagnosis Date  . Mood swings ([place])   . Type II or  unspecified type diabetes mellitus with unspecified complication, uncontrolled 06/02/2014  . Other and unspecified hyperlipidemia 06/02/2014  . OSA on CPAP 06/02/2014  . 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 Bipolar disorder ([place])   . Hypertension   . Gout   . Bipolar 1 disorder ([place])   . Hx of blood clots    History reviewed. No pertinent past surgical history. Family History:  Family History  Problem Relation Age of Onset  . Dementia Mother   . Arthritis Father   . Hypertension Father   .  Heart failure Neg Hx   . Kidney failure Neg Hx   . Cancer Neg Hx   . Mental illness Sister   . Diabetes Sister    Family Psychiatric  History: positive for anxiety and depression probably bipolar disorder Social History:  History  Alcohol Use No     History  Drug Use No    Social History   Social History  . Marital Status: Married    Spouse Name: N/A  . Number of Children: N/A  . Years of Education: N/A   Social History Main Topics  . Smoking status: Never Smoker   . Smokeless tobacco: None  . Alcohol Use: No  . Drug Use: No  . Sexual Activity: Not Currently   Other Topics Concern  . None   Social History Narrative   *The patient has been married for over 30 years and is currently on disability. He lives with his wife in the [place] area and has one daughter.             Additional Social History:                         Sleep: Fair  Appetite:  Fair  Current Medications: Current Facility-Administered Medications  Medication Dose Route Frequency Provider Last Rate Last Dose  . acetaminophen (TYLENOL) tablet 650 mg  650 mg Oral Q6H PRN Gonzella Lex, MD   650 mg at 12/23/15 2315  . allopurinol (ZYLOPRIM) tablet 300 mg  300 mg Oral Daily Gonzella Lex, MD   300 mg at 01/05/16 1358  . alum & mag hydroxide-simeth (MAALOX/MYLANTA) 200-200-20 MG/5ML suspension 30 mL  30 mL Oral Q4H PRN Gonzella Lex, MD      . apixaban (ELIQUIS) tablet 5 mg  5 mg Oral BID Gonzella Lex, MD   5 mg at 01/05/16 1358  . aspirin EC tablet 81 mg  81 mg Oral Daily Gonzella Lex, MD   81 mg at 01/05/16 1358  . atorvastatin (LIPITOR) tablet 10 mg  10 mg Oral q1800 Gonzella Lex, MD   10 mg at 01/05/16 1641  . docusate sodium (COLACE) capsule 100 mg  100 mg Oral BID Gonzella Lex, MD   100 mg at 01/05/16 1358  . fentaNYL (SUBLIMAZE) injection 25 mcg  25 mcg Intravenous Q5 min PRN Gunnar Bulla, MD      . fentaNYL (SUBLIMAZE) injection 25 mcg  25 mcg Intravenous Q5 min PRN  Gijsbertus Lonia Mad, MD      . finasteride (PROSCAR) tablet 5 mg  5 mg Oral Daily Gonzella Lex, MD   5 mg at 01/05/16 1358  . furosemide (LASIX) tablet 20 mg  20 mg Oral Daily Gonzella Lex, MD   20 mg at 01/05/16 1358  . insulin aspart (novoLOG) injection 0-9 Units  0-9 Units Subcutaneous TID WC  Chauncey Mann, MD   3 Units at 01/05/16 1642  . lisinopril (PRINIVIL,ZESTRIL) tablet 40 mg  40 mg Oral Daily Clovis Fredrickson, MD   40 mg at 01/05/16 0743  . magnesium hydroxide (MILK OF MAGNESIA) suspension 30 mL  30 mL Oral Daily PRN Gonzella Lex, MD   30 mL at 01/02/16 2123  . metFORMIN (GLUCOPHAGE) tablet 500 mg  500 mg Oral BID WC Gonzella Lex, MD   500 mg at 01/05/16 1641  . mirtazapine (REMERON) tablet 45 mg  45 mg Oral QHS Gonzella Lex, MD   45 mg at 01/04/16 2235  . ondansetron (ZOFRAN) injection 4 mg  4 mg Intravenous Once PRN Gunnar Bulla, MD      . ondansetron [facility]) injection 4 mg  4 mg Intravenous Once PRN Gijsbertus F [place] Service, MD      . polyethylene glycol (MIRALAX / GLYCOLAX) packet 17 g  17 g Oral Daily Gonzella Lex, MD   17 g at 01/05/16 1357  . QUEtiapine (SEROQUEL) tablet 400 mg  400 mg Oral QHS Gonzella Lex, MD   400 mg at 01/04/16 2235  . tamsulosin (FLOMAX) capsule 0.4 mg  0.4 mg Oral QPC supper Gonzella Lex, MD   0.4 mg at 01/04/16 1705    Lab Results:  Results for orders placed or performed during the hospital encounter of 12/22/15 (from the past 48 hour(s))  Creatinine, serum     Status: Abnormal   Collection Time: 01/04/16  6:17 AM  Result Value Ref Range   Creatinine, Ser 1.30 (H) 0.61 - 1.24 mg/dL   GFR calc non Af Amer 57 (L) >60 mL/min   GFR calc Af Amer >60 >60 mL/min    Comment: (NOTE) The eGFR has been calculated using the CKD EPI equation. This calculation has not been validated in all clinical situations. eGFR's persistently <60 mL/min signify possible Chronic Kidney Disease.   CBC     Status: Abnormal   Collection Time:  01/04/16  6:17 AM  Result Value Ref Range   WBC 3.8 3.8 - 10.6 K/uL   RBC 4.45 4.40 - 5.90 MIL/uL   Hemoglobin 13.2 13.0 - 18.0 g/dL   HCT 39.0 (L) 40.0 - 52.0 %   MCV 87.7 80.0 - 100.0 fL   MCH 29.7 26.0 - 34.0 pg   MCHC 33.9 32.0 - 36.0 g/dL   RDW 13.0 11.5 - 14.5 %   Platelets 151 150 - 440 K/uL    Physical Findings: AIMS: Facial and Oral Movements Muscles of Facial Expression: None, normal Lips and Perioral Area: None, normal Jaw: None, normal Tongue: None, normal,Extremity Movements Upper (arms, wrists, hands, fingers): None, normal Lower (legs, knees, ankles, toes): None, normal, Trunk Movements Neck, shoulders, hips: None, normal, Overall Severity Severity of abnormal movements (highest score from questions above): None, normal Incapacitation due to abnormal movements: None, normal Patient's awareness of abnormal movements (rate only patient's report): No Awareness, Dental Status Current problems with teeth and/or dentures?: No Does patient usually wear dentures?: No  CIWA:    COWS:     Musculoskeletal: Strength & Muscle Tone: within normal limits Gait & Station: unsteady Patient leans: N/A  Psychiatric Specialty Exam: Review of Systems  Constitutional: Positive for malaise/fatigue.  HENT: Negative.   Eyes: Negative.   Respiratory: Negative.   Cardiovascular: Negative.   Gastrointestinal: Negative.   Musculoskeletal: Negative.   Skin: Negative.   Neurological: Positive for weakness.  Psychiatric/Behavioral: Positive for depression and  suicidal ideas.    Blood pressure 136/84, pulse 95, temperature 98 F (36.7 C), temperature source Oral, resp. rate 18, height 6' (1.829 m), weight 103.42 kg (228 lb), SpO2 100 %.Body mass index is 30.92 kg/(m^2).  General Appearance: Disheveled  Eye Sport and exercise psychologist::  Fair  Speech:  Slow  Volume:  Decreased  Mood:  Depressed  Affect:  Constricted  Thought Process:  Loose  Orientation:  Full (Time, Place, and Person)  Thought  Content:  Negative  Suicidal Thoughts:  Yes.  without intent/plan  Homicidal Thoughts:  No  Memory:  Immediate;   Fair Recent;   Fair Remote;   Fair  Judgement:  Fair  Insight:  Fair  Psychomotor Activity:  Decreased  Concentration:  Poor  Recall:  [place]  Language: Fair  Akathisia:  No  Handed:  Right  AIMS (if indicated):     Assets:  Communication Skills Desire for Improvement Financial Resources/Insurance Housing Social Support  ADL's:  Impaired  Cognition: Impaired,  Mild  Sleep:  Number of Hours: 7   Treatment Plan Summary: Medication management and Plan see the rest of the note. Continue ECT for major depression. Also medication management with today adding a small dose of lithium which had been previously helpful.  Alethia Berthold, MD 01/05/2016, 6:18 PM [facility] MD Progress Note  01/05/2016 6:19 PM Christian [name]  MRN:  [id number]    Subjective:   The patient remains sad and isolative to his room. Affect was flat and he was not interacting much with staff or peers. He does come out for meals but has not been attending groups. He denies any current active or passive suicidal thoughts or psychotic symptoms. Per nursing, he did sleep fairly well last night but is also frequently napping during the daytime. He denies any new somatic complaints. He continues to use a walker in order to help with ambulation. His wife did come to visit him yesterday and will come again on Monday he says. He was unable to verbalize any specific triggering factors to depression. Appetite is fairly good and he does come out regularly for meals on the unit.Marland Kitchen He does complain of chronic pain bilaterally in his shoulders due to rotator cuff tears.  He is agreeable to continuing with ECT and is hopeful it will improve his mood.  Past Psychiatric History: Patient has a long history of bipolar disorder with both depressed and manic phases in the past a history of suicide attempts and  now a history of aggression towards his wife. Has responded well to ECT but is very fragile and his illness with a tendency to relapse quickly.   Social history: The patient was born and raised in [place] by both his biological parents. 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 He has been married for 63 years and has one daughter. He is currently in disability.  Substance abuse history: The patient denies any history of any heavy alcohol use or illicit drug use   Legal history: The patient denies any prior arrest or incarcerations.   Principal Problem: Bipolar disorder, current episode depressed, severe, with psychotic features ([place]) Diagnosis:   Patient Active Problem List   Diagnosis Date Noted  . Bipolar I disorder, most recent episode depressed, severe w psychosis ([place]) [F31.5] 12/22/2015  . Bipolar affective disorder, depressed, severe, with psychotic behavior ([place]) [F31.5]   . Bipolar I disorder, current or most recent episode depressed, with psychotic features ([place]) [F31.5]   .  Bipolar I disorder, most recent episode depressed ([place]) [F31.30]   . Severe bipolar I disorder with depression ([place]) [F31.4]   . Bipolar I disorder, most recent episode depressed, severe with psychotic features ([place]) [F31.5]   . Overdose [T50.901A] 09/11/2015  . Acute respiratory failure with hypoxia ([place]) [J96.01] 09/05/2015  . Elevated troponin [R79.89] 09/05/2015  . Somnolence [R40.0] 09/05/2015  . Bipolar depression ([place]) [F31.30] 09/05/2015  . Acute bilateral deep vein thrombosis (DVT) of femoral veins (H[facility]) [I82.413] 09/05/2015  . Bilateral pulmonary embolism ([place]) [I26.99] 09/02/2015  . Labile hypertension [I10]   . Pulmonary embolism with acute cor pulmonale ([place]) [I26.09]   . Dyspnea [R06.00]   . Orthostatic hypotension [I95.1]   . Bipolar disorder, in partial remission, most recent episode depressed ([place]) [F31.75]   . Syncope, near [R55] 08/31/2015  . Affective  psychosis, bipolar ([place]) [F31.9]   . Bipolar disorder, now depressed ([place]) [F31.30] 08/28/2015  . Bipolar I disorder, severe, current or most recent episode depressed, with psychotic features ([place]) [F31.5]   . Pressure ulcer [L89.90] 07/30/2015  . Protein-calorie malnutrition, severe ([place]) [E43] 07/30/2015  . Dehydration [E86.0] 07/29/2015  . Bipolar disorder, current episode depressed, severe, with psychotic features ([place]) [F31.5] 07/09/2015  . Diabetes ([place]) [E11.9] 07/09/2015  . UTI (urinary tract infection) [N39.0] 06/19/2014  . Positive blood culture [R78.81] 06/19/2014  . Bipolar affective disorder, current episode manic with psychotic symptoms ([place]) [F31.2] 06/17/2014  . Type II or unspecified type diabetes mellitus with unspecified complication, uncontrolled [E11.8, E11.65] 06/02/2014  . Other and unspecified hyperlipidemia [E78.5] 06/02/2014  . OSA on CPAP [G47.33] 06/02/2014  . 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 Gout [M10.9] 06/02/2014  . Hypertension [I10]    Total Time spent with patient: 30 minutes   Past Medical History:  Past Medical History  Diagnosis Date  . Mood swings ([place])   . Type II or unspecified type diabetes mellitus with unspecified complication, uncontrolled 06/02/2014  . Other and unspecified hyperlipidemia 06/02/2014  . OSA on CPAP 06/02/2014  . 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 Bipolar disorder ([place])   . Hypertension   . Gout   . Bipolar 1 disorder ([place])   . Hx of blood clots    History reviewed. No pertinent past surgical history. Family History:  Family History  Problem Relation Age of Onset  . Dementia Mother   . Arthritis Father   . Hypertension Father   . Heart failure Neg Hx   . Kidney failure Neg Hx   . Cancer Neg Hx   . Mental illness Sister   . Diabetes Sister    Family Psychiatric  History: Positive family history for anxiety and bipolar  disorder Social History:  History  Alcohol Use No     History  Drug Use No    Social History   Social History  . Marital Status: Married    Spouse Name: N/A  . Number of Children: N/A  . Years of Education: N/A   Social History Main Topics  . Smoking status: Never Smoker   . Smokeless tobacco: None  . Alcohol Use: No  . Drug Use: No  . Sexual Activity: Not Currently   Other Topics Concern  . None   Social History Narrative   *The patient has been married for over 30 years and is currently on disability. He lives with his wife in the [place] area and has one daughter.  Sleep: Fair  Appetite:  Fair  Current Medications: Current Facility-Administered Medications  Medication Dose Route Frequency Provider Last Rate Last Dose  . acetaminophen (TYLENOL) tablet 650 mg  650 mg Oral Q6H PRN Gonzella Lex, MD   650 mg at 12/23/15 2315  . allopurinol (ZYLOPRIM) tablet 300 mg  300 mg Oral Daily Gonzella Lex, MD   300 mg at 01/05/16 1358  . alum & mag hydroxide-simeth (MAALOX/MYLANTA) 200-200-20 MG/5ML suspension 30 mL  30 mL Oral Q4H PRN Gonzella Lex, MD      . apixaban (ELIQUIS) tablet 5 mg  5 mg Oral BID Gonzella Lex, MD   5 mg at 01/05/16 1358  . aspirin EC tablet 81 mg  81 mg Oral Daily Gonzella Lex, MD   81 mg at 01/05/16 1358  . atorvastatin (LIPITOR) tablet 10 mg  10 mg Oral q1800 Gonzella Lex, MD   10 mg at 01/05/16 1641  . docusate sodium (COLACE) capsule 100 mg  100 mg Oral BID Gonzella Lex, MD   100 mg at 01/05/16 1358  . fentaNYL (SUBLIMAZE) injection 25 mcg  25 mcg Intravenous Q5 min PRN Gunnar Bulla, MD      . fentaNYL (SUBLIMAZE) injection 25 mcg  25 mcg Intravenous Q5 min PRN Gijsbertus Lonia Mad, MD      . finasteride (PROSCAR) tablet 5 mg  5 mg Oral Daily Gonzella Lex, MD   5 mg at 01/05/16 1358  . furosemide (LASIX) tablet 20 mg  20 mg Oral Daily Gonzella Lex, MD   20 mg at 01/05/16 1358  . insulin aspart (novoLOG) injection  0-9 Units  0-9 Units Subcutaneous TID WC Chauncey Mann, MD   3 Units at 01/05/16 1642  . lisinopril (PRINIVIL,ZESTRIL) tablet 40 mg  40 mg Oral Daily Clovis Fredrickson, MD   40 mg at 01/05/16 0743  . lithium carbonate (LITHOBID) CR tablet 300 mg  300 mg Oral QHS Gonzella Lex, MD      . magnesium hydroxide (MILK OF MAGNESIA) suspension 30 mL  30 mL Oral Daily PRN Gonzella Lex, MD   30 mL at 01/02/16 2123  . metFORMIN (GLUCOPHAGE) tablet 500 mg  500 mg Oral BID WC Gonzella Lex, MD   500 mg at 01/05/16 1641  . mirtazapine (REMERON) tablet 45 mg  45 mg Oral QHS Gonzella Lex, MD   45 mg at 01/04/16 2235  . ondansetron (ZOFRAN) injection 4 mg  4 mg Intravenous Once PRN Gunnar Bulla, MD      . ondansetron [facility]) injection 4 mg  4 mg Intravenous Once PRN Gijsbertus F [place] Service, MD      . polyethylene glycol (MIRALAX / GLYCOLAX) packet 17 g  17 g Oral Daily Gonzella Lex, MD   17 g at 01/05/16 1357  . QUEtiapine (SEROQUEL) tablet 400 mg  400 mg Oral QHS Gonzella Lex, MD   400 mg at 01/04/16 2235  . tamsulosin (FLOMAX) capsule 0.4 mg  0.4 mg Oral QPC supper Gonzella Lex, MD   0.4 mg at 01/04/16 1705    Lab Results:  Results for orders placed or performed during the hospital encounter of 12/22/15 (from the past 48 hour(s))  Creatinine, serum     Status: Abnormal   Collection Time: 01/04/16  6:17 AM  Result Value Ref Range   Creatinine, Ser 1.30 (H) 0.61 - 1.24 mg/dL   GFR calc non Af Wyvonnia Lora  57 (L) >60 mL/min   GFR calc Af Amer >60 >60 mL/min    Comment: (NOTE) The eGFR has been calculated using the CKD EPI equation. This calculation has not been validated in all clinical situations. eGFR's persistently <60 mL/min signify possible Chronic Kidney Disease.   CBC     Status: Abnormal   Collection Time: 01/04/16  6:17 AM  Result Value Ref Range   WBC 3.8 3.8 - 10.6 K/uL   RBC 4.45 4.40 - 5.90 MIL/uL   Hemoglobin 13.2 13.0 - 18.0 g/dL   HCT 39.0 (L) 40.0 - 52.0 %   MCV 87.7 80.0  - 100.0 fL   MCH 29.7 26.0 - 34.0 pg   MCHC 33.9 32.0 - 36.0 g/dL   RDW 13.0 11.5 - 14.5 %   Platelets 151 150 - 440 K/uL    Physical Findings: AIMS: Facial and Oral Movements Muscles of Facial Expression: None, normal Lips and Perioral Area: None, normal Jaw: None, normal Tongue: None, normal,Extremity Movements Upper (arms, wrists, hands, fingers): None, normal Lower (legs, knees, ankles, toes): None, normal, Trunk Movements Neck, shoulders, hips: None, normal, Overall Severity Severity of abnormal movements (highest score from questions above): None, normal Incapacitation due to abnormal movements: None, normal Patient's awareness of abnormal movements (rate only patient's report): No Awareness, Dental Status Current problems with teeth and/or dentures?: No Does patient usually wear dentures?: No      Musculoskeletal: Strength & Muscle Tone: within normal limits Gait & Station: unsteady Patient leans: N/A  Psychiatric Specialty Exam: Review of Systems  Constitutional: Negative for fever, chills, weight loss and malaise/fatigue.  HENT: Negative.  Negative for ear discharge, ear pain, hearing loss, nosebleeds and tinnitus.   Eyes: Negative.  Negative for blurred vision, double vision, photophobia, pain and discharge.  Respiratory: Negative.  Negative for cough, hemoptysis, sputum production and shortness of breath.   Cardiovascular: Negative.  Negative for chest pain, palpitations, orthopnea, claudication and leg swelling.  Gastrointestinal: Negative.  Negative for nausea, vomiting, abdominal pain, diarrhea, constipation and blood in stool.  Genitourinary: Negative.  Negative for dysuria, urgency and frequency.  Musculoskeletal: Negative.  Negative for back pain, falls and neck pain.       He has bilateral pain in his shoulders.  Skin: Negative.  Negative for itching and rash.  Neurological: Positive for weakness. Negative for dizziness, tingling, tremors, sensory change,  speech change, focal weakness, seizures, loss of consciousness and headaches.       The patient has some weakness and unstable gait and is ambulating with a walker  Endo/Heme/Allergies: Negative for environmental allergies. Does not bruise/bleed easily.  Psychiatric/Behavioral: Positive for depression. Negative for suicidal ideas, hallucinations, memory loss and substance abuse. The patient has insomnia. The patient is not nervous/anxious.     Blood pressure 136/84, pulse 95, temperature 98 F (36.7 C), temperature source Oral, resp. rate 18, height 6' (1.829 m), weight 103.42 kg (228 lb), SpO2 100 %.Body mass index is 30.92 kg/(m^2).  General Appearance: Disheveled  Eye Sport and exercise psychologist::  Fair  Speech:  Slow  Volume:  Decreased  Mood:  Dysphoric  Affect:  Flat  Thought Process:  Goal Directed  Orientation:  Full (Time, Place, and Person)  Thought Content:  Rumination  Suicidal Thoughts:  No  Homicidal Thoughts:  No  Memory:  Immediate;   Fair Recent;   Fair Remote;   Fair  Judgement:  Impaired  Insight:  Fair  Psychomotor Activity:  Decreased  Concentration:  Fair  Recall:  Fair  Fund of Knowledge:Fair  Language: Fair  Akathisia:  No  Handed:  Right  AIMS (if indicated):     Assets:  Desire for Improvement Financial Resources/Insurance Housing Resilience Social Support  ADL's:  Impaired  Cognition: Impaired,  Mild  Sleep:  Number of Hours: 7    Mr. Richert is a 65 year old married African-American male with a history of bipolar disorder, most recent episode depressed. He is currently undergoing ECT treatment with Dr. Weber Cooks.   Bipolar disorder, type I, most recent episode depressed with psychotic behavior: The patient will continue with ECT treatment with Dr. Weber Cooks. He has his next ECT treatment on Monday. Unfortunately, he remains depressed but no current active suicidal thoughts. The patient just recently had an increase in Seroquel 400 mg by mouth nightly. He is also on  Remeron 45 mg by mouth nightly for depression.Consider Rexulti if mood does not improve. The patient is currently being treated for both hyperlipidemia and diabetes I added lithium tonight as his renal function is slightly improved. Only 300 mg at night.  Hypertension: The patient will be maintained on Lasix 20 mg by mouth daily, lisinopril 40 mg by mouth daily and Lipitor 10 mg by mouth daily. He is also on aspirin 81 mg by mouth daily Vital signs are stable.  Diabetes: We'll check hemoglobin A1c. The patient is on metformin 500 mg by mouth twice a day. Will start checking blood sugars and SSI placed. He will be maintained on a carbohydrate limited diet  History of DVT: We'll continue on Eliquis was 5 mg by mouth daily  Hyperlipidemia: We'll continue on Lipitor 10 mg by mouth daily  Gout: The patient is currently on allopurinol 300 mg by mouth daily  Disposition:should actually does not have a stable place at this point because of his recent behavior and the concerns his wife has. This is a major problem for disposition and when I proposed to the patient that it may be keeping him depressed he agreed with me. 01/05/2016, 6:19 PM

## 2016-01-05 NOTE — Plan of Care (Signed)
Problem: Alteration in mood Goal: LTG-Patient reports reduction in suicidal thoughts (Patient reports reduction in suicidal thoughts and is able to verbalize a safety plan for whenever patient is feeling suicidal)  Outcome: Progressing Patient denies SI/HI.      

## 2016-01-05 NOTE — Plan of Care (Signed)
Problem: Ineffective individual coping Goal: LTG: Patient will report a decrease in negative feelings Outcome: Progressing Continues to endorse depression.  Rates depression as an 8/10.  Denies SI.  Reluctant to talk.

## 2016-01-05 NOTE — Progress Notes (Signed)
Recreation Therapy Notes  Date: 02.06.17 Time: 3:00 pm Location: Craft Room  Group Topic: Wellness  Goal Area(s) Addresses:  Patient will identify at least one item per dimension of health. Patient will examine areas they are deficient in.  Behavioral Response: Did not attend  Intervention: 6 Dimensions of Health  Activity: Patients were given a sheets with the definitions of the 6 Dimensions of Health on it and a worksheet with the 6 Dimensions written out. Patients were instructed to write 2-3 things they are currently doing in each category.  Education: LRT educated patients on ways they can increase areas they are deficient in.  Education Outcome: Patient did not attend group.  Clinical Observations/Feedback: Patient did not attend group.  Jacquelynn Cree, LRT/CTRS 01/05/2016 4:13 PM

## 2016-01-05 NOTE — Transfer of Care (Signed)
Immediate Anesthesia Transfer of Care Note  Patient: Christian [name]  Procedure(s) Performed: * No procedures listed *  Patient Location: PACU  Anesthesia Type:General  Level of Consciousness: sedated  Airway & Oxygen Therapy: Patient Spontanous Breathing and Patient connected to face mask oxygen  Post-op Assessment: Report given to RN and Post -op Vital signs reviewed and stable  Post vital signs: Reviewed and stable  Last Vitals:  Filed Vitals:   01/05/16 0917 01/05/16 1129  BP: 117/84 175/119  Pulse: 101   Temp: 36.1 C 36.9 C  Resp: 16 16    Complications: No apparent anesthesia complications

## 2016-01-05 NOTE — [facility] Group Notes (Signed)
[facility] LCSW Group Therapy  01/05/2016 3:29 PM  Type of Therapy:  Group Therapy  Participation Level:  Did Not Attend  Summary of Progress/Problems: Patient is scheduled ECT today and did not attend group.  Lulu Riding, MSW, LCSWA 01/05/2016, 3:29 PM

## 2016-01-06 LAB — GLUCOSE, CAPILLARY
Glucose-Capillary: 118 mg/dL — ABNORMAL HIGH (ref 65–99)
Glucose-Capillary: 182 mg/dL — ABNORMAL HIGH (ref 65–99)
Glucose-Capillary: 204 mg/dL — ABNORMAL HIGH (ref 65–99)
Glucose-Capillary: 122 mg/dL — ABNORMAL HIGH (ref 65–99)
Glucose-Capillary: 85 mg/dL (ref 65–99)

## 2016-01-06 NOTE — [facility] Group Notes (Signed)
[facility] LCSW Group Therapy  01/06/2016 2:45 PM  Type of Therapy:  Group Therapy  Participation Level:  Did Not Attend  Summary of Progress/Problems: Patient was called to group but did not attend.  Lulu Riding, MSW, LCSWA 01/06/2016, 2:45 PM

## 2016-01-06 NOTE — Progress Notes (Signed)
Recreation Therapy Notes  Date: 02.07.17 Time: 3:00 pm Location: Craft Room  Group Topic: Self-expression  Goal Area(s) Addresses:  Patient will be able to identify a color that represents each emotion. Patient will verbalize benefit of using art as a means of self-expression. Patient will verbalize one emotion experienced while participating in activity.  Behavioral Response: Did not attend  Intervention: The Colors Within Me  Activity: Patients were given a blank face worksheet and instructed to analyze the emotions they were experiencing, pick a color for each emotion, and show on the face how much of that emotion they were experiencing.  Education: LRT educated patients on different forms of self-expression.  Education Outcome: Patient did not attend group.  Clinical Observations/Feedback: Patient did not attend group.  Jacquelynn Cree, LRT/CTRS 01/06/2016 3:58 PM

## 2016-01-06 NOTE — [facility] Group Notes (Signed)
[facility] Group Notes:  (Nursing/MHT/Case Management/Adjunct)  Date:  01/06/2016  Time:  1:59 PM  Type of Therapy:  Psychoeducational Skills  Participation Level:  Did Not Attend  Christian [name] 01/06/2016, 1:59 PM

## 2016-01-06 NOTE — [facility] Group Notes (Signed)
[facility] Group Notes:  (Nursing/MHT/Case Management/Adjunct)  Date:  01/06/2016  Time:  10:18 PM  Type of Therapy:  Evening Wrap-up Group  Participation Level:  Did Not Attend  Participation Quality:  N/A  Affect:  N/A  Cognitive:  N/A  Insight:  None  Engagement in Group:  Supportive  Modes of Intervention:  N/A  Summary of Progress/Problems:  Christian [name] 01/06/2016, 10:18 PM

## 2016-01-06 NOTE — [facility] Group Notes (Signed)
[facility] Group Notes:  (Nursing/MHT/Case Management/Adjunct)  Date:  01/06/2016  Time:  3:56 AM  Type of Therapy:  Group Therapy  Participation Level:  Did Not Attend  Summary of Progress/Problems:  Christian Turek Imani  [name] 01/06/2016, 3:56 AM 

## 2016-01-06 NOTE — Progress Notes (Signed)
Isolates to room except for meals and medication pass.  Denies SI.  Affect flat and continues to profess to depression.  No group attendance.  Safety maintained.  Support and encouragement offered.

## 2016-01-06 NOTE — Progress Notes (Signed)
D: Pt remains isolative in his room this evening. Affect is depressed. Pt denies SI/HI/AVH at this time. Denies pain. Pt rates depression as a 5 out of 10. Pt forwards little information. No concerns or complaints at this time. A: Emotional support provided. Pt encouraged to attend groups and participate in unit activities. Medications administered as prescribed. q15 minute safety checks maintained. R: Pt remains free from harm.

## 2016-01-06 NOTE — Plan of Care (Signed)
Problem: Alteration in mood Goal: LTG-Pt's behavior demonstrates decreased signs of depression (Patient's behavior demonstrates decreased signs of depression to the point the patient is safe to return home and continue treatment in an outpatient setting)  Outcome: Not Progressing Pt continues to isolate in his room and verbalize feelings of depression.

## 2016-01-07 ENCOUNTER — Inpatient Hospital Stay: Payer: Commercial Managed Care - HMO | Admitting: Anesthesiology

## 2016-01-07 ENCOUNTER — Inpatient Hospital Stay: Payer: Commercial Managed Care - HMO

## 2016-01-07 ENCOUNTER — Encounter: Payer: Self-pay | Admitting: *Deleted

## 2016-01-07 ENCOUNTER — Ambulatory Visit: Payer: Self-pay | Admitting: *Deleted

## 2016-01-07 MED ORDER — SODIUM CHLORIDE 0.9 % IV SOLN
INTRAVENOUS | Status: DC | PRN
  Administered 2016-01-07 (×2): via INTRAVENOUS

## 2016-01-07 MED ORDER — LABETALOL HCL 5 MG/ML IV SOLN
INTRAVENOUS | Status: DC | PRN
  Administered 2016-01-07 (×2): 20 mg via INTRAVENOUS

## 2016-01-07 MED ORDER — SODIUM CHLORIDE 0.9 % IV SOLN
250.0000 mL | Freq: Once | INTRAVENOUS | Status: AC
  Administered 2016-01-07: 250 mL via INTRAVENOUS

## 2016-01-07 MED ORDER — SUCCINYLCHOLINE 20MG/ML (10ML) SYRINGE FOR MEDFUSION PUMP - OPTIME
INTRAMUSCULAR | Status: DC | PRN
  Administered 2016-01-07: 150 mg via INTRAVENOUS

## 2016-01-07 MED ORDER — KETAMINE HCL 10 MG/ML IJ SOLN
INTRAMUSCULAR | Status: DC | PRN
  Administered 2016-01-07: 120 mg via INTRAVENOUS

## 2016-01-07 NOTE — Transfer of Care (Signed)
Immediate Anesthesia Transfer of Care Note  Patient: Christian [name]  Procedure(s) Performed: ECT  Patient Location: PACU  Anesthesia Type:General  Level of Consciousness: sedated  Airway & Oxygen Therapy: Patient Spontanous Breathing and Patient connected to face mask oxygen  Post-op Assessment: Report given to RN  Post vital signs: Reviewed and stable  Last Vitals:  Filed Vitals:   01/07/16 0823 01/07/16 1050  BP: 112/85 149/109  Pulse: 100 93  Temp: 36 C 98.43F  Resp: 18 16    Complications: No apparent anesthesia complications

## 2016-01-07 NOTE — Anesthesia Preprocedure Evaluation (Signed)
Anesthesia Evaluation  Patient identified by MRN, date of birth, ID band Patient awake    Reviewed: Allergy & Precautions, NPO status , Patient's Chart, lab work & pertinent test results  Airway Mallampati: III       Dental  (+) Partial Lower, Partial Upper   Pulmonary shortness of breath and with exertion, sleep apnea ,     + decreased breath sounds      Cardiovascular Exercise Tolerance: Poor hypertension, Pt. on medications + Peripheral Vascular Disease   Rhythm:Regular     Neuro/Psych    GI/Hepatic negative GI ROS, Neg liver ROS,   Endo/Other  diabetes, Type 2, Oral Hypoglycemic AgentsMorbid obesity  Renal/GU Renal InsufficiencyRenal disease     Musculoskeletal negative musculoskeletal ROS (+)   Abdominal (+) + obese,   Peds  Hematology   Anesthesia Other Findings   Reproductive/Obstetrics                             Anesthesia Physical  Anesthesia Plan  ASA: III  Anesthesia Plan: General   Post-op Pain Management:    Induction: Intravenous  Airway Management Planned: Mask  Additional Equipment:   Intra-op Plan:   Post-operative Plan:   Informed Consent: I have reviewed the patients History and Physical, chart, labs and discussed the procedure including the risks, benefits and alternatives for the proposed anesthesia with the patient or authorized representative who has indicated his/her understanding and acceptance.     Plan Discussed with: CRNA  Anesthesia Plan Comments:         Anesthesia Quick Evaluation

## 2016-01-07 NOTE — Progress Notes (Signed)
Recreation Therapy Notes  Date: 02.08.17 Time: 3:00 pm Location: Craft Room  Group Topic: Self-esteem  Goal Area(s) Addresses:  Patient will be able to identify benefit of self-esteem. Patient will be able to identify ways to increase self-esteem.  Behavioral Response: Did not attend  Intervention: Self-Portrait  Activity: Patients were instructed to draw their self-portrait of how they felt. Patients were then instructed to write their name on a piece of paper and a positive trait. Patient's passed the paper around the room and wrote positive traits about peers. Patient drew their self-portrait again after they read the positive traits.  Education: LRT educated patients on ways they can increase their self-esteem.  Education Outcome: Patient did not attend group.  Clinical Observations/Feedback: Patient did not attend group.  Jacquelynn Cree, LRT/CTRS 01/07/2016 5:26 PM

## 2016-01-07 NOTE — H&P (Signed)
Christian [name] is an 65 y.o. male.   Chief Complaint: Patient with severe depression and bipolar disorder doesn't appear to be getting any better still feeling very down HPI: Patient with a history of bipolar depression with both severe manias and depressions  Past Medical History  Diagnosis Date  . Mood swings (HCC)   . Type II or unspecified type diabetes mellitus with unspecified complication, uncontrolled 06/02/2014  . Other and unspecified hyperlipidemia 06/02/2014  . OSA on CPAP 06/02/2014  . 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 Bipolar disorder (HCC)   . Hypertension   . Gout   . Bipolar 1 disorder (HCC)   . Hx of blood clots     History reviewed. No pertinent past surgical history.  Family History  Problem Relation Age of Onset  . Dementia Mother   . Arthritis Father   . Hypertension Father   . Heart failure Neg Hx   . Kidney failure Neg Hx   . Cancer Neg Hx   . Mental illness Sister   . Diabetes Sister    Social History:  reports that he has never smoked. He does not have any smokeless tobacco history on file. He reports that he does not drink alcohol or use illicit drugs.  Allergies: No Known Allergies  Medications Prior to Admission  Medication Sig Dispense Refill  . allopurinol (ZYLOPRIM) 300 MG tablet Take 1 tablet (300 mg total) by mouth daily. 30 tablet 0  . apixaban (ELIQUIS) 5 MG TABS tablet Take 1 tablet (5 mg total) by mouth 2 (two) times daily. 60 tablet 0  . atorvastatin (LIPITOR) 10 MG tablet Take 1 tablet (10 mg total) by mouth daily at 6 PM. 30 tablet 0  . clonazePAM (KLONOPIN) 1 MG tablet Take 1 tablet (1 mg total) by mouth at bedtime. 30 tablet 0  . finasteride (PROSCAR) 5 MG tablet Take 1 tablet (5 mg total) by mouth daily. 30 tablet 0  . furosemide (LASIX) 20 MG tablet Take 20 mg by mouth daily.     Marland Kitchen lisinopril (PRINIVIL,ZESTRIL) 40 MG tablet Take 40 mg by mouth daily.    . metFORMIN (GLUCOPHAGE-XR) 500 MG  24 hr tablet Take 500 mg by mouth 2 (two) times daily.    . mirtazapine (REMERON) 45 MG tablet Take 1 tablet (45 mg total) by mouth at bedtime. 30 tablet 0  . OLANZapine (ZYPREXA) 15 MG tablet Take 2 tablets (30 mg total) by mouth at bedtime. 60 tablet 0  . tamsulosin (FLOMAX) 0.4 MG CAPS capsule Take 1 capsule (0.4 mg total) by mouth daily after supper. 30 capsule 0  . aspirin EC 81 MG EC tablet Take 1 tablet (81 mg total) by mouth daily. (Patient not taking: Reported on 12/29/2015) 30 tablet 0  . docusate sodium (COLACE) 100 MG capsule Take 2 capsules (200 mg total) by mouth 2 (two) times daily. (Patient not taking: Reported on 12/29/2015) 60 capsule 0  . polyethylene glycol (MIRALAX / GLYCOLAX) packet Take 17 g by mouth daily. (Patient not taking: Reported on 12/29/2015) 30 each 0    Results for orders placed or performed during the hospital encounter of 12/26/15 (from the past 48 hour(s))  Glucose, capillary     Status: Abnormal   Collection Time: 01/05/16 12:22 PM  Result Value Ref Range   Glucose-Capillary 182 (H) 65 - 99 mg/dL   Comment 1 Notify RN   Glucose, capillary     Status: Abnormal  Collection Time: 01/05/16  4:40 PM  Result Value Ref Range   Glucose-Capillary 204 (H) 65 - 99 mg/dL  Glucose, capillary     Status: Abnormal   Collection Time: 01/05/16  8:44 PM  Result Value Ref Range   Glucose-Capillary 122 (H) 65 - 99 mg/dL  Glucose, capillary     Status: None   Collection Time: 01/06/16  6:41 AM  Result Value Ref Range   Glucose-Capillary 85 65 - 99 mg/dL   No results found.  Review of Systems  Constitutional: Negative.   HENT: Negative.   Eyes: Negative.   Respiratory: Negative.   Cardiovascular: Negative.   Gastrointestinal: Negative.   Musculoskeletal: Negative.   Skin: Negative.   Neurological: Negative.   Psychiatric/Behavioral: Positive for depression, suicidal ideas and memory loss. Negative for hallucinations and substance abuse. The patient has insomnia.      Blood pressure 112/85, pulse 100, temperature 96.8 F (36 C), temperature source Oral, resp. rate 18, height 6' (1.829 m), weight 104.327 kg (230 lb), SpO2 100 %. Physical Exam  Nursing note and vitals reviewed. Constitutional: He appears well-developed and well-nourished.  HENT:  Head: Normocephalic and atraumatic.  Eyes: Conjunctivae are normal. Pupils are equal, round, and reactive to light.  Neck: Normal range of motion.  Cardiovascular: Normal rate, regular rhythm and normal heart sounds.   Respiratory: Effort normal and breath sounds normal. No respiratory distress.  GI: Soft.  Musculoskeletal: Normal range of motion.  Neurological: He is alert.  Skin: Skin is warm and dry.  Psychiatric: His speech is delayed. He is slowed. He expresses inappropriate judgment. He exhibits a depressed mood. He expresses suicidal ideation. He exhibits abnormal recent memory.     Assessment/Plan Patient will receive ECT today and we are continuing to examine whether this is the right course of treatment going forward. See future notes.  [name], MD 01/07/2016, 10:25 AM

## 2016-01-07 NOTE — Procedures (Addendum)
[facility] SERVICES Physician's Interval Evaluation & Treatment Note  Patient Identification: Christian [name] MRN:  [id number] Date of Evaluation:  01/07/2016 TX #: [id number]  MADRS:   MMSE:   P.E. Findings:  Patient normal vitals. Continues to be very depressed and down  Psychiatric Interval Note:  Depressed mood vague suicidal ideation  Subjective:  Patient is a 65 y.o. male seen for evaluation for Electroconvulsive Therapy. Doesn't feel any better  Treatment Summary:   []   Right Unilateral             [x]  Bilateral   % Energy : 1.0 ms 100%   Impedance: 700 ohms  Seizure Energy Index: 670 V squared  Postictal Suppression Index: 78%  Seizure Concordance Index: 97%  Medications  Pre Shock: Labetalol 20 mg ketamine 120 mg succinylcholine 150 mg  Post Shock: Labetalol 20 mg  Seizure Duration: EMG could not be detected 23 seconds by my observation and EEG   Comments: Poor quality seizure. Unclear why it's worse this time. We are reevaluating the continued benefit of treatment at this point.   Lungs:  [x]   Clear to auscultation               []  Other:   Heart:    [x]   Regular rhythm             []  irregular rhythm    [x]   Previous H&P reviewed, patient examined and there are NO CHANGES                 []   Previous H&P reviewed, patient examined and there are changes noted.   [name], MD 2/8/201710:27 AM

## 2016-01-07 NOTE — Progress Notes (Signed)
Continues to complain of feeling depressed.  Affect flat.  Isolates to room. Support and encouragement offered.  Safety maintained.

## 2016-01-07 NOTE — [facility] Group Notes (Signed)
[facility] LCSW Group Therapy  01/07/2016 3:04 PM  Type of Therapy:  Group Therapy  Participation Level:  Did Not Attend    Summary of Progress/Problems:Patient is scheduled ECT today and did not attend group.  Lulu Riding, MSW, LCSWA 01/07/2016, 3:04 PM

## 2016-01-07 NOTE — [facility] Group Notes (Signed)
[facility] LCSW Aftercare Discharge Planning Group Note   01/07/2016 3:03 PM  Participation Quality:  Patient was scheduled ECT today and did not attend group.    Lulu Riding, MSW, LCSWA

## 2016-01-07 NOTE — Tx Team (Signed)
Interdisciplinary Treatment Plan Update (Adult)  Date:  01/07/2016 Time Reviewed:  3:43 PM  Progress in Treatment: Attending groups: No. Participating in groups:  No. Taking medication as prescribed:  Yes. Tolerating medication:  Yes. Family/Significant othe contact made:  Yes, individual(s) contacted:  wife Patient understands diagnosis:  Yes. Discussing patient identified problems/goals with staff:  Yes. Medical problems stabilized or resolved:  Yes. Denies suicidal/homicidal ideation: Yes. Issues/concerns per patient self-inventory:  Yes. Other:  New problem(s) identified: No, Describe:  NA  Discharge Plan or Barriers:Pt unable to return home, lacks funding for group home or ALF placement. Wife is unwilling to have him come home as she feels unsafe. There are no family members at this time that he will be able to stay with. Mrs. Bhakta's plan is to help him with a hotel/boarding house until other plans can be made if nothing can be done from the hospital.  Reason for Continuation of Hospitalization: Depression Medication stabilization Suicidal ideation Other; describe ECT  Comments:Patient still depressed. Denies suicidal intent or plan. Not going to groups not getting out of his room much.   Estimated length of stay: 7 days   New goal(s):  Review of initial/current patient goals per problem list:   1.  Goal(s): Patient will participate in aftercare plan * Met: No * Target date: at discharge * As evidenced by: Patient will participate within aftercare plan AEB aftercare provider and housing plan at discharge being identified. 01/07/16: Patient lethargic and very depressed and not able to participate in conversations about aftercare plan   2.  Goal (s): Patient will exhibit decreased depressive symptoms and suicidal ideations. * Met: No *  Target date: at discharge * As evidenced by: Patient will utilize self rating of depression at 3 or below and demonstrate decreased  signs of depression or be deemed stable for discharge by MD. 01/07/16: Patient is in ECT treatment but appears to become more depressed and isolating in his room.   3.  Goal(s): Patient will demonstrate decreased signs and symptoms of anxiety. * Met: No * Target date: at discharge * As evidenced by: Patient will utilize self rating of anxiety at 3 or below and demonstrated decreased signs of anxiety, or be deemed stable for discharge by MD 01/07/16: Patient is taking medications but remains in his room throughout the day  Attendees: Physician:  Alethia Berthold, MD 2/8/20173:43 PM  Nursing:   Elige Radon, RN  2/8/20173:43 PM  Other:  Fransisca Connors 2/8/20173:43 PM  Other:   2/8/20173:43 PM  Other:   2/8/20173:43 PM  Other:  2/8/20173:43 PM  Other:  2/8/20173:43 PM  Other:  2/8/20173:43 PM  Other:  2/8/20173:43 PM  Other:  2/8/20173:43 PM  Other:  2/8/20173:43 PM  Other:   2/8/20173:43 PM   Scribe for Treatment Team:   Keene Breath, MSW, [place]  01/07/2016, 3:43 PM
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
## 2016-01-07 NOTE — Progress Notes (Signed)
[facility] MD Progress Note  01-29-16 6:27 PM Christian [name]  MRN:  [id number] Subjective:  Follow-up for 65 year old man with a history of bipolar disorder depressed. Has been receiving ECT. Despite 3 times a week bilateral ECT his mood has been getting worse. He had treatment today and tolerated it well. This evening he reports that he is still feeling bad and now tells me he is having thoughts of wishing he were dead. [facility] MD Progress Note  01/29/2016 6:27 PM Christian [name]  MRN:  [id number]    Subjective:   Patient says he is feeling a little bit better today. Seems to be making an effort to try and be more optimistic. Denies suicidal ideation this evening. He is agreeable to continuing with ECT and is hopeful it will improve his mood.  Past Psychiatric History: Patient has a long history of bipolar disorder with both depressed and manic phases in the past a history of suicide attempts and now a history of aggression towards his wife. Has responded well to ECT but is very fragile and his illness with a tendency to relapse quickly.   Social history: The patient was born and raised in [place] by both his biological parents. 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 He has been married for 35 years and has one daughter. He is currently in disability.  Substance abuse history: The patient denies any history of any heavy alcohol use or illicit drug use   Legal history: The patient denies any prior arrest or incarcerations.   Principal Problem: Bipolar disorder, current episode depressed, severe, with psychotic features (HCC) Diagnosis:   Patient Active Problem List   Diagnosis Date Noted  . Bipolar I disorder, most recent episode depressed, severe w psychosis (HCC) [F31.5] 12/22/2015  . Bipolar affective disorder, depressed, severe, with psychotic behavior (HCC) [F31.5]   . Bipolar I disorder, current or most recent episode depressed, with psychotic features (HCC)  [F31.5]   . Bipolar I disorder, most recent episode depressed (HCC) [F31.30]   . Severe bipolar I disorder with depression (HCC) [F31.4]   . Bipolar I disorder, most recent episode depressed, severe with psychotic features (HCC) [F31.5]   . Overdose [T50.901A] 09/11/2015  . Acute respiratory failure with hypoxia (HCC) [J96.01] 09/05/2015  . Elevated troponin [R79.89] 09/05/2015  . Somnolence [R40.0] 09/05/2015  . Bipolar depression (HCC) [F31.30] 09/05/2015  . Acute bilateral deep vein thrombosis (DVT) of femoral veins (HCC) [I82.413] 09/05/2015  . Bilateral pulmonary embolism (HCC) [I26.99] 09/02/2015  . Labile hypertension [I10]   . Pulmonary embolism with acute cor pulmonale (HCC) [I26.09]   . Dyspnea [R06.00]   . Orthostatic hypotension [I95.1]   . Bipolar disorder, in partial remission, most recent episode depressed (HCC) [F31.75]   . Syncope, near [R55] 08/31/2015  . Affective psychosis, bipolar (HCC) [F31.9]   . Bipolar disorder, now depressed (HCC) [F31.30] 08/28/2015  . Bipolar I disorder, severe, current or most recent episode depressed, with psychotic features (HCC) [F31.5]   . Pressure ulcer [L89.90] 07/30/2015  . Protein-calorie malnutrition, severe (HCC) [E43] 07/30/2015  . Dehydration [E86.0] 07/29/2015  . Bipolar disorder, current episode depressed, severe, with psychotic features (HCC) [F31.5] 07/09/2015  . Diabetes (HCC) [E11.9] 07/09/2015  . UTI (urinary tract infection) [N39.0] 06/19/2014  . Positive blood culture [R78.81] 06/19/2014  . Bipolar affective disorder, current episode manic with psychotic symptoms (HCC) [F31.2] 06/17/2014  . Type II or unspecified type diabetes mellitus with unspecified complication, uncontrolled [E11.8, E11.65]  06/02/2014  . Other and unspecified hyperlipidemia [E78.5] 06/02/2014  . OSA on CPAP [G47.33] 06/02/2014  . 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 Gout [M10.9] 06/02/2014  .  Hypertension [I10]    Total Time spent with patient: 30 minutes   Past Medical History:  Past Medical History  Diagnosis Date  . Mood swings (HCC)   . Type II or unspecified type diabetes mellitus with unspecified complication, uncontrolled 06/02/2014  . Other and unspecified hyperlipidemia 06/02/2014  . OSA on CPAP 06/02/2014  . 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 Bipolar disorder (HCC)   . Hypertension   . Gout   . Bipolar 1 disorder (HCC)   . Hx of blood clots    History reviewed. No pertinent past surgical history. Family History:  Family History  Problem Relation Age of Onset  . Dementia Mother   . Arthritis Father   . Hypertension Father   . Heart failure Neg Hx   . Kidney failure Neg Hx   . Cancer Neg Hx   . Mental illness Sister   . Diabetes Sister    Family Psychiatric  History: Positive family history for anxiety and bipolar disorder Social History:  History  Alcohol Use No     History  Drug Use No    Social History   Social History  . Marital Status: Married    Spouse Name: N/A  . Number of Children: N/A  . Years of Education: N/A   Social History Main Topics  . Smoking status: Never Smoker   . Smokeless tobacco: None  . Alcohol Use: No  . Drug Use: No  . Sexual Activity: Not Currently   Other Topics Concern  . None   Social History Narrative   *The patient has been married for over 30 years and is currently on disability. He lives with his wife in the [place] area and has one daughter.             Sleep: Fair  Appetite:  Fair  Current Medications: Current Facility-Administered Medications  Medication Dose Route Frequency Provider Last Rate Last Dose  . acetaminophen (TYLENOL) tablet 650 mg  650 mg Oral Q6H PRN Audery Amel, MD   650 mg at 01/07/16 1418  . allopurinol (ZYLOPRIM) tablet 300 mg  300 mg Oral Daily Audery Amel, MD   300 mg at 01/07/16 1417  . alum & mag hydroxide-simeth  (MAALOX/MYLANTA) 200-200-20 MG/5ML suspension 30 mL  30 mL Oral Q4H PRN Audery Amel, MD      . apixaban (ELIQUIS) tablet 5 mg  5 mg Oral BID Audery Amel, MD   5 mg at 01/07/16 1418  . aspirin EC tablet 81 mg  81 mg Oral Daily Audery Amel, MD   81 mg at 01/07/16 1417  . atorvastatin (LIPITOR) tablet 10 mg  10 mg Oral q1800 Audery Amel, MD   10 mg at 01/07/16 1746  . docusate sodium (COLACE) capsule 100 mg  100 mg Oral BID Audery Amel, MD   100 mg at 01/07/16 1417  . fentaNYL (SUBLIMAZE) injection 25 mcg  25 mcg Intravenous Q5 min PRN Berdine Addison, MD      . fentaNYL (SUBLIMAZE) injection 25 mcg  25 mcg Intravenous Q5 min PRN Gijsbertus Georgana Curio, MD      . finasteride (PROSCAR) tablet 5 mg  5 mg Oral Daily Audery Amel, MD  5 mg at 01/07/16 1417  . furosemide (LASIX) tablet 20 mg  20 mg Oral Daily Audery Amel, MD   20 mg at 01/07/16 1418  . insulin aspart (novoLOG) injection 0-9 Units  0-9 Units Subcutaneous TID WC Darliss Ridgel, MD   1 Units at 01/06/16 1143  . lisinopril (PRINIVIL,ZESTRIL) tablet 40 mg  40 mg Oral Daily Shari Prows, MD   40 mg at 01/07/16 6712  . lithium carbonate (LITHOBID) CR tablet 300 mg  300 mg Oral QHS Audery Amel, MD   300 mg at 01/06/16 2200  . magnesium hydroxide (MILK OF MAGNESIA) suspension 30 mL  30 mL Oral Daily PRN Audery Amel, MD   30 mL at 01/02/16 2123  . metFORMIN (GLUCOPHAGE) tablet 500 mg  500 mg Oral BID WC Audery Amel, MD   500 mg at 01/07/16 1746  . mirtazapine (REMERON) tablet 45 mg  45 mg Oral QHS Audery Amel, MD   45 mg at 01/06/16 2200  . ondansetron (ZOFRAN) injection 4 mg  4 mg Intravenous Once PRN Berdine Addison, MD      . ondansetron [facility]) injection 4 mg  4 mg Intravenous Once PRN Gijsbertus F Darleene Cleaver, MD      . polyethylene glycol (MIRALAX / GLYCOLAX) packet 17 g  17 g Oral Daily Audery Amel, MD   17 g at 01/06/16 0901  . QUEtiapine (SEROQUEL) tablet 400 mg  400 mg Oral QHS Audery Amel, MD    400 mg at 01/06/16 2159  . tamsulosin (FLOMAX) capsule 0.4 mg  0.4 mg Oral QPC supper Audery Amel, MD   0.4 mg at 01/07/16 1746    Lab Results:  Results for orders placed or performed during the hospital encounter of 12/26/15 (from the past 48 hour(s))  Glucose, capillary     Status: Abnormal   Collection Time: 01/05/16  8:44 PM  Result Value Ref Range   Glucose-Capillary 122 (H) 65 - 99 mg/dL  Glucose, capillary     Status: None   Collection Time: 01/06/16  6:41 AM  Result Value Ref Range   Glucose-Capillary 85 65 - 99 mg/dL    Physical Findings: AIMS: Facial and Oral Movements Muscles of Facial Expression: None, normal Lips and Perioral Area: None, normal Jaw: None, normal Tongue: None, normal,Extremity Movements Upper (arms, wrists, hands, fingers): None, normal Lower (legs, knees, ankles, toes): None, normal, Trunk Movements Neck, shoulders, hips: None, normal, Overall Severity Severity of abnormal movements (highest score from questions above): None, normal Incapacitation due to abnormal movements: None, normal Patient's awareness of abnormal movements (rate only patient's report): No Awareness, Dental Status Current problems with teeth and/or dentures?: No Does patient usually wear dentures?: No      Musculoskeletal: Strength & Muscle Tone: within normal limits Gait & Station: unsteady Patient leans: N/A  Psychiatric Specialty Exam: Review of Systems  Constitutional: Negative for fever, chills, weight loss and malaise/fatigue.  HENT: Negative.  Negative for ear discharge, ear pain, hearing loss, nosebleeds and tinnitus.   Eyes: Negative.  Negative for blurred vision, double vision, photophobia, pain and discharge.  Respiratory: Negative.  Negative for cough, hemoptysis, sputum production and shortness of breath.   Cardiovascular: Negative.  Negative for chest pain, palpitations, orthopnea, claudication and leg swelling.  Gastrointestinal: Negative.  Negative for  nausea, vomiting, abdominal pain, diarrhea, constipation and blood in stool.  Genitourinary: Negative.  Negative for dysuria, urgency and frequency.  Musculoskeletal: Negative.  Negative for  back pain, falls and neck pain.       He has bilateral pain in his shoulders.  Skin: Negative.  Negative for itching and rash.  Neurological: Positive for weakness. Negative for dizziness, tingling, tremors, sensory change, speech change, focal weakness, seizures, loss of consciousness and headaches.       The patient has some weakness and unstable gait and is ambulating with a walker  Endo/Heme/Allergies: Negative for environmental allergies. Does not bruise/bleed easily.  Psychiatric/Behavioral: Positive for depression. Negative for suicidal ideas, hallucinations, memory loss and substance abuse. The patient has insomnia. The patient is not nervous/anxious.     Blood pressure 136/83, pulse 90, temperature 98 F (36.7 C), temperature source Oral, resp. rate 18, height 6' (1.829 m), weight 104.327 kg (230 lb), SpO2 90 %.Body mass index is 31.19 kg/(m^2).  General Appearance: Disheveled  Eye Solicitor::  Fair  Speech:  Slow  Volume:  Decreased  Mood:  Dysphoric  Affect:  Flat  Thought Process:  Goal Directed  Orientation:  Full (Time, Place, and Person)  Thought Content:  Rumination  Suicidal Thoughts:  No  Homicidal Thoughts:  No  Memory:  Immediate;   Fair Recent;   Fair Remote;   Fair  Judgement:  Impaired  Insight:  Fair  Psychomotor Activity:  Decreased  Concentration:  Fair  Recall:  [employer] of Knowledge:Fair  Language: Fair  Akathisia:  No  Handed:  Right  AIMS (if indicated):     Assets:  Desire for Improvement Financial Resources/Insurance Housing Resilience Social Support  ADL's:  Impaired  Cognition: Impaired,  Mild  Sleep:  Number of Hours: 8    Christian [name] is a 65 year old married African-American male with a history of bipolar disorder, most recent episode depressed. He  is currently undergoing ECT treatment with Dr. Toni Amend.   Bipolar disorder, type I, most recent episode depressed with psychotic behavior: The patient will continue with ECT treatment with Dr. Toni Amend. He has his next ECT treatment on Monday. Unfortunately, he remains depressed but no current active suicidal thoughts. The patient just recently had an increase in Seroquel 400 mg by mouth nightly. He is also on Remeron 45 mg by mouth nightly for depression.Consider Rexulti if mood does not improve. The patient is currently being treated for both hyperlipidemia and diabetes  Hypertension: The patient will be maintained on Lasix 20 mg by mouth daily, lisinopril 40 mg by mouth daily and Lipitor 10 mg by mouth daily. He is also on aspirin 81 mg by mouth daily Vital signs are stable.  Diabetes: We'll check hemoglobin A1c. The patient is on metformin 500 mg by mouth twice a day. Will start checking blood sugars and SSI placed. He will be maintained on a carbohydrate limited diet  History of DVT: We'll continue on Eliquis was 5 mg by mouth daily  Hyperlipidemia: We'll continue on Lipitor 10 mg by mouth daily  Gout: The patient is currently on allopurinol 300 mg by mouth daily  Disposition: The patient does have a stable living situation. He will follow-up with Dr. Emerson Monte after discharge  01/07/2016, 6:27 PM  Principal Problem: Bipolar disorder, current episode depressed, severe, with psychotic features [facility]) Diagnosis:   Patient Active Problem List   Diagnosis Date Noted  . Bipolar I disorder, most recent episode depressed, severe w psychosis (HCC) [F31.5] 12/22/2015  . Bipolar affective disorder, depressed, severe, with psychotic behavior (HCC) [F31.5]   . Bipolar I disorder, current or most recent episode depressed, with psychotic features (  HCC) [F31.5]   . Bipolar I disorder, most recent episode depressed (HCC) [F31.30]   . Severe bipolar I disorder with depression (HCC) [F31.4]   .  Bipolar I disorder, most recent episode depressed, severe with psychotic features (HCC) [F31.5]   . Overdose [T50.901A] 09/11/2015  . Acute respiratory failure with hypoxia (HCC) [J96.01] 09/05/2015  . Elevated troponin [R79.89] 09/05/2015  . Somnolence [R40.0] 09/05/2015  . Bipolar depression (HCC) [F31.30] 09/05/2015  . Acute bilateral deep vein thrombosis (DVT) of femoral veins (HCC) [I82.413] 09/05/2015  . Bilateral pulmonary embolism (HCC) [I26.99] 09/02/2015  . Labile hypertension [I10]   . Pulmonary embolism with acute cor pulmonale (HCC) [I26.09]   . Dyspnea [R06.00]   . Orthostatic hypotension [I95.1]   . Bipolar disorder, in partial remission, most recent episode depressed (HCC) [F31.75]   . Syncope, near [R55] 08/31/2015  . Affective psychosis, bipolar (HCC) [F31.9]   . Bipolar disorder, now depressed (HCC) [F31.30] 08/28/2015  . Bipolar I disorder, severe, current or most recent episode depressed, with psychotic features (HCC) [F31.5]   . Pressure ulcer [L89.90] 07/30/2015  . Protein-calorie malnutrition, severe (HCC) [E43] 07/30/2015  . Dehydration [E86.0] 07/29/2015  . Bipolar disorder, current episode depressed, severe, with psychotic features (HCC) [F31.5] 07/09/2015  . Diabetes (HCC) [E11.9] 07/09/2015  . UTI (urinary tract infection) [N39.0] 06/19/2014  . Positive blood culture [R78.81] 06/19/2014  . Bipolar affective disorder, current episode manic with psychotic symptoms (HCC) [F31.2] 06/17/2014  . Type II or unspecified type diabetes mellitus with unspecified complication, uncontrolled [E11.8, E11.65] 06/02/2014  . Other and unspecified hyperlipidemia [E78.5] 06/02/2014  . OSA on CPAP [G47.33] 06/02/2014  . 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 Gout [M10.9] 06/02/2014  . Hypertension [I10]    Total Time spent with patient: 30 minutes  Past Psychiatric History: past history of severe major depression as part of  bipolar disorder also past history of manic episodes. Depression getting worse in the past couple years. 3 of several suicide attempts  Past Medical History:  Past Medical History  Diagnosis Date  . Mood swings (HCC)   . Type II or unspecified type diabetes mellitus with unspecified complication, uncontrolled 06/02/2014  . Other and unspecified hyperlipidemia 06/02/2014  . OSA on CPAP 06/02/2014  . 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 Bipolar disorder (HCC)   . Hypertension   . Gout   . Bipolar 1 disorder (HCC)   . Hx of blood clots    History reviewed. No pertinent past surgical history. Family History:  Family History  Problem Relation Age of Onset  . Dementia Mother   . Arthritis Father   . Hypertension Father   . Heart failure Neg Hx   . Kidney failure Neg Hx   . Cancer Neg Hx   . Mental illness Sister   . Diabetes Sister    Family Psychiatric  History: positive for anxiety and depression probably bipolar disorder Social History:  History  Alcohol Use No     History  Drug Use No    Social History   Social History  . Marital Status: Married    Spouse Name: N/A  . Number of Children: N/A  . Years of Education: N/A   Social History Main Topics  . Smoking status: Never Smoker   . Smokeless tobacco: None  . Alcohol Use: No  . Drug Use: No  . Sexual Activity: Not Currently   Other Topics Concern  .  None   Social History Narrative   *The patient has been married for over 30 years and is currently on disability. He lives with his wife in the [place] area and has one daughter.             Additional Social History:                         Sleep: Fair  Appetite:  Fair  Current Medications: Current Facility-Administered Medications  Medication Dose Route Frequency Provider Last Rate Last Dose  . acetaminophen (TYLENOL) tablet 650 mg  650 mg Oral Q6H PRN Audery Amel, MD   650 mg at 01/07/16 1418  .  allopurinol (ZYLOPRIM) tablet 300 mg  300 mg Oral Daily Audery Amel, MD   300 mg at 01/07/16 1417  . alum & mag hydroxide-simeth (MAALOX/MYLANTA) 200-200-20 MG/5ML suspension 30 mL  30 mL Oral Q4H PRN Audery Amel, MD      . apixaban (ELIQUIS) tablet 5 mg  5 mg Oral BID Audery Amel, MD   5 mg at 01/07/16 1418  . aspirin EC tablet 81 mg  81 mg Oral Daily Audery Amel, MD   81 mg at 01/07/16 1417  . atorvastatin (LIPITOR) tablet 10 mg  10 mg Oral q1800 Audery Amel, MD   10 mg at 01/07/16 1746  . docusate sodium (COLACE) capsule 100 mg  100 mg Oral BID Audery Amel, MD   100 mg at 01/07/16 1417  . fentaNYL (SUBLIMAZE) injection 25 mcg  25 mcg Intravenous Q5 min PRN Berdine Addison, MD      . fentaNYL (SUBLIMAZE) injection 25 mcg  25 mcg Intravenous Q5 min PRN Gijsbertus Georgana Curio, MD      . finasteride (PROSCAR) tablet 5 mg  5 mg Oral Daily Audery Amel, MD   5 mg at 01/07/16 1417  . furosemide (LASIX) tablet 20 mg  20 mg Oral Daily Audery Amel, MD   20 mg at 01/07/16 1418  . insulin aspart (novoLOG) injection 0-9 Units  0-9 Units Subcutaneous TID WC Darliss Ridgel, MD   1 Units at 01/06/16 1143  . lisinopril (PRINIVIL,ZESTRIL) tablet 40 mg  40 mg Oral Daily Shari Prows, MD   40 mg at 01/07/16 1610  . lithium carbonate (LITHOBID) CR tablet 300 mg  300 mg Oral QHS Audery Amel, MD   300 mg at 01/06/16 2200  . magnesium hydroxide (MILK OF MAGNESIA) suspension 30 mL  30 mL Oral Daily PRN Audery Amel, MD   30 mL at 01/02/16 2123  . metFORMIN (GLUCOPHAGE) tablet 500 mg  500 mg Oral BID WC Audery Amel, MD   500 mg at 01/07/16 1746  . mirtazapine (REMERON) tablet 45 mg  45 mg Oral QHS Audery Amel, MD   45 mg at 01/06/16 2200  . ondansetron (ZOFRAN) injection 4 mg  4 mg Intravenous Once PRN Berdine Addison, MD      . ondansetron [facility]) injection 4 mg  4 mg Intravenous Once PRN Gijsbertus F Darleene Cleaver, MD      . polyethylene glycol (MIRALAX / GLYCOLAX) packet 17 g   17 g Oral Daily Audery Amel, MD   17 g at 01/06/16 0901  . QUEtiapine (SEROQUEL) tablet 400 mg  400 mg Oral QHS Audery Amel, MD   400 mg at 01/06/16 2159  . tamsulosin (FLOMAX) capsule 0.4 mg  0.4 mg Oral QPC supper Audery Amel, MD   0.4 mg at 01/07/16 1746    Lab Results:  Results for orders placed or performed during the hospital encounter of 12/26/15 (from the past 48 hour(s))  Glucose, capillary     Status: Abnormal   Collection Time: 01/05/16  8:44 PM  Result Value Ref Range   Glucose-Capillary 122 (H) 65 - 99 mg/dL  Glucose, capillary     Status: None   Collection Time: 01/06/16  6:41 AM  Result Value Ref Range   Glucose-Capillary 85 65 - 99 mg/dL    Physical Findings: AIMS: Facial and Oral Movements Muscles of Facial Expression: None, normal Lips and Perioral Area: None, normal Jaw: None, normal Tongue: None, normal,Extremity Movements Upper (arms, wrists, hands, fingers): None, normal Lower (legs, knees, ankles, toes): None, normal, Trunk Movements Neck, shoulders, hips: None, normal, Overall Severity Severity of abnormal movements (highest score from questions above): None, normal Incapacitation due to abnormal movements: None, normal Patient's awareness of abnormal movements (rate only patient's report): No Awareness, Dental Status Current problems with teeth and/or dentures?: No Does patient usually wear dentures?: No  CIWA:    COWS:     Musculoskeletal: Strength & Muscle Tone: within normal limits Gait & Station: unsteady Patient leans: N/A  Psychiatric Specialty Exam: Review of Systems  Constitutional: Positive for malaise/fatigue.  HENT: Negative.   Eyes: Negative.   Respiratory: Negative.   Cardiovascular: Negative.   Gastrointestinal: Negative.   Musculoskeletal: Negative.   Skin: Negative.   Neurological: Positive for weakness.  Psychiatric/Behavioral: Positive for depression. Negative for suicidal ideas.    Blood pressure 136/83, pulse  90, temperature 98 F (36.7 C), temperature source Oral, resp. rate 18, height 6' (1.829 m), weight 104.327 kg (230 lb), SpO2 90 %.Body mass index is 31.19 kg/(m^2).  General Appearance: Disheveled  Eye Solicitor::  Fair  Speech:  Slow  Volume:  Decreased  Mood:  Depressed  Affect:  Constricted  Thought Process:  Loose  Orientation:  Full (Time, Place, and Person)  Thought Content:  Negative  Suicidal Thoughts:  Yes.  without intent/plan  Homicidal Thoughts:  No  Memory:  Immediate;   Fair Recent;   Fair Remote;   Fair  Judgement:  Fair  Insight:  Fair  Psychomotor Activity:  Decreased  Concentration:  Poor  Recall:  [employer] of Knowledge:Fair  Language: Fair  Akathisia:  No  Handed:  Right  AIMS (if indicated):     Assets:  Communication Skills Desire for Improvement Financial Resources/Insurance Housing Social Support  ADL's:  Impaired  Cognition: Impaired,  Mild  Sleep:  Number of Hours: 8   Treatment Plan Summary: Medication management and Plan see the rest of the note. Continue ECT for major depression. Also medication management with today adding a small dose of lithium which had been previously helpful.  [name], MD 01/07/2016, 6:27 PM [facility] MD Progress Note  01/07/2016 6:27 PM Manjot Hinks Holladay  MRN:  [id number]    Subjective:   The patient remains sad and isolative to his room. Affect was flat and he was not interacting much with staff or peers. He does come out for meals but has not been attending groups. He denies any current active or passive suicidal thoughts or psychotic symptoms. Per nursing, he did sleep fairly well last night but is also frequently napping during the daytime. He denies any new somatic complaints. He continues to use a walker in order to help with ambulation.  His wife did come to visit him yesterday and will come again on Monday he says. He was unable to verbalize any specific triggering factors to depression. Appetite is fairly good and  he does come out regularly for meals on the unit.Marland Kitchen He does complain of chronic pain bilaterally in his shoulders due to rotator cuff tears.  He is agreeable to continuing with ECT and is hopeful it will improve his mood.  Past Psychiatric History: Patient has a long history of bipolar disorder with both depressed and manic phases in the past a history of suicide attempts and now a history of aggression towards his wife. Has responded well to ECT but is very fragile and his illness with a tendency to relapse quickly.   Social history: The patient was born and raised in [place] by both his biological parents. 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 He has been married for 35 years and has one daughter. He is currently in disability.  Substance abuse history: The patient denies any history of any heavy alcohol use or illicit drug use   Legal history: The patient denies any prior arrest or incarcerations.   Principal Problem: Bipolar disorder, current episode depressed, severe, with psychotic features (HCC) Diagnosis:   Patient Active Problem List   Diagnosis Date Noted  . Bipolar I disorder, most recent episode depressed, severe w psychosis (HCC) [F31.5] 12/22/2015  . Bipolar affective disorder, depressed, severe, with psychotic behavior (HCC) [F31.5]   . Bipolar I disorder, current or most recent episode depressed, with psychotic features (HCC) [F31.5]   . Bipolar I disorder, most recent episode depressed (HCC) [F31.30]   . Severe bipolar I disorder with depression (HCC) [F31.4]   . Bipolar I disorder, most recent episode depressed, severe with psychotic features (HCC) [F31.5]   . Overdose [T50.901A] 09/11/2015  . Acute respiratory failure with hypoxia (HCC) [J96.01] 09/05/2015  . Elevated troponin [R79.89] 09/05/2015  . Somnolence [R40.0] 09/05/2015  . Bipolar depression (HCC) [F31.30] 09/05/2015  . Acute bilateral deep vein thrombosis (DVT) of femoral veins  (HCC) [I82.413] 09/05/2015  . Bilateral pulmonary embolism (HCC) [I26.99] 09/02/2015  . Labile hypertension [I10]   . Pulmonary embolism with acute cor pulmonale (HCC) [I26.09]   . Dyspnea [R06.00]   . Orthostatic hypotension [I95.1]   . Bipolar disorder, in partial remission, most recent episode depressed (HCC) [F31.75]   . Syncope, near [R55] 08/31/2015  . Affective psychosis, bipolar (HCC) [F31.9]   . Bipolar disorder, now depressed (HCC) [F31.30] 08/28/2015  . Bipolar I disorder, severe, current or most recent episode depressed, with psychotic features (HCC) [F31.5]   . Pressure ulcer [L89.90] 07/30/2015  . Protein-calorie malnutrition, severe (HCC) [E43] 07/30/2015  . Dehydration [E86.0] 07/29/2015  . Bipolar disorder, current episode depressed, severe, with psychotic features (HCC) [F31.5] 07/09/2015  . Diabetes (HCC) [E11.9] 07/09/2015  . UTI (urinary tract infection) [N39.0] 06/19/2014  . Positive blood culture [R78.81] 06/19/2014  . Bipolar affective disorder, current episode manic with psychotic symptoms (HCC) [F31.2] 06/17/2014  . Type II or unspecified type diabetes mellitus with unspecified complication, uncontrolled [E11.8, E11.65] 06/02/2014  . Other and unspecified hyperlipidemia [E78.5] 06/02/2014  . OSA on CPAP [G47.33] 06/02/2014  . 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 Gout [M10.9] 06/02/2014  . Hypertension [I10]    Total Time spent with patient: 30 minutes   Past Medical History:  Past Medical History  Diagnosis Date  . Mood swings (HCC)   .  Type II or unspecified type diabetes mellitus with unspecified complication, uncontrolled 06/02/2014  . Other and unspecified hyperlipidemia 06/02/2014  . OSA on CPAP 06/02/2014  . 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 Bipolar disorder (HCC)   . Hypertension   . Gout   . Bipolar 1 disorder (HCC)   . Hx of blood clots    History  reviewed. No pertinent past surgical history. Family History:  Family History  Problem Relation Age of Onset  . Dementia Mother   . Arthritis Father   . Hypertension Father   . Heart failure Neg Hx   . Kidney failure Neg Hx   . Cancer Neg Hx   . Mental illness Sister   . Diabetes Sister    Family Psychiatric  History: Positive family history for anxiety and bipolar disorder Social History:  History  Alcohol Use No     History  Drug Use No    Social History   Social History  . Marital Status: Married    Spouse Name: N/A  . Number of Children: N/A  . Years of Education: N/A   Social History Main Topics  . Smoking status: Never Smoker   . Smokeless tobacco: None  . Alcohol Use: No  . Drug Use: No  . Sexual Activity: Not Currently   Other Topics Concern  . None   Social History Narrative   *The patient has been married for over 30 years and is currently on disability. He lives with his wife in the [place] area and has one daughter.             Sleep: Fair  Appetite:  Fair  Current Medications: Current Facility-Administered Medications  Medication Dose Route Frequency Provider Last Rate Last Dose  . acetaminophen (TYLENOL) tablet 650 mg  650 mg Oral Q6H PRN Audery Amel, MD   650 mg at 01/07/16 1418  . allopurinol (ZYLOPRIM) tablet 300 mg  300 mg Oral Daily Audery Amel, MD   300 mg at 01/07/16 1417  . alum & mag hydroxide-simeth (MAALOX/MYLANTA) 200-200-20 MG/5ML suspension 30 mL  30 mL Oral Q4H PRN Audery Amel, MD      . apixaban (ELIQUIS) tablet 5 mg  5 mg Oral BID Audery Amel, MD   5 mg at 01/07/16 1418  . aspirin EC tablet 81 mg  81 mg Oral Daily Audery Amel, MD   81 mg at 01/07/16 1417  . atorvastatin (LIPITOR) tablet 10 mg  10 mg Oral q1800 Audery Amel, MD   10 mg at 01/07/16 1746  . docusate sodium (COLACE) capsule 100 mg  100 mg Oral BID Audery Amel, MD   100 mg at 01/07/16 1417  . fentaNYL (SUBLIMAZE) injection 25 mcg  25 mcg  Intravenous Q5 min PRN Berdine Addison, MD      . fentaNYL (SUBLIMAZE) injection 25 mcg  25 mcg Intravenous Q5 min PRN Gijsbertus Georgana Curio, MD      . finasteride (PROSCAR) tablet 5 mg  5 mg Oral Daily Audery Amel, MD   5 mg at 01/07/16 1417  . furosemide (LASIX) tablet 20 mg  20 mg Oral Daily Audery Amel, MD   20 mg at 01/07/16 1418  . insulin aspart (novoLOG) injection 0-9 Units  0-9 Units Subcutaneous TID WC Darliss Ridgel, MD   1 Units at 01/06/16 1143  . lisinopril (PRINIVIL,ZESTRIL) tablet 40 mg  40 mg Oral Daily Jolanta  Holley Raring, MD   40 mg at 01/07/16 1610  . lithium carbonate (LITHOBID) CR tablet 300 mg  300 mg Oral QHS Audery Amel, MD   300 mg at 01/06/16 2200  . magnesium hydroxide (MILK OF MAGNESIA) suspension 30 mL  30 mL Oral Daily PRN Audery Amel, MD   30 mL at 01/02/16 2123  . metFORMIN (GLUCOPHAGE) tablet 500 mg  500 mg Oral BID WC Audery Amel, MD   500 mg at 01/07/16 1746  . mirtazapine (REMERON) tablet 45 mg  45 mg Oral QHS Audery Amel, MD   45 mg at 01/06/16 2200  . ondansetron (ZOFRAN) injection 4 mg  4 mg Intravenous Once PRN Berdine Addison, MD      . ondansetron [facility]) injection 4 mg  4 mg Intravenous Once PRN Gijsbertus F Darleene Cleaver, MD      . polyethylene glycol (MIRALAX / GLYCOLAX) packet 17 g  17 g Oral Daily Audery Amel, MD   17 g at 01/06/16 0901  . QUEtiapine (SEROQUEL) tablet 400 mg  400 mg Oral QHS Audery Amel, MD   400 mg at 01/06/16 2159  . tamsulosin (FLOMAX) capsule 0.4 mg  0.4 mg Oral QPC supper Audery Amel, MD   0.4 mg at 01/07/16 1746    Lab Results:  Results for orders placed or performed during the hospital encounter of 12/26/15 (from the past 48 hour(s))  Glucose, capillary     Status: Abnormal   Collection Time: 01/05/16  8:44 PM  Result Value Ref Range   Glucose-Capillary 122 (H) 65 - 99 mg/dL  Glucose, capillary     Status: None   Collection Time: 01/06/16  6:41 AM  Result Value Ref Range   Glucose-Capillary 85  65 - 99 mg/dL    Physical Findings: AIMS: Facial and Oral Movements Muscles of Facial Expression: None, normal Lips and Perioral Area: None, normal Jaw: None, normal Tongue: None, normal,Extremity Movements Upper (arms, wrists, hands, fingers): None, normal Lower (legs, knees, ankles, toes): None, normal, Trunk Movements Neck, shoulders, hips: None, normal, Overall Severity Severity of abnormal movements (highest score from questions above): None, normal Incapacitation due to abnormal movements: None, normal Patient's awareness of abnormal movements (rate only patient's report): No Awareness, Dental Status Current problems with teeth and/or dentures?: No Does patient usually wear dentures?: No      Musculoskeletal: Strength & Muscle Tone: within normal limits Gait & Station: unsteady Patient leans: N/A  Psychiatric Specialty Exam: Review of Systems  Constitutional: Negative for fever, chills, weight loss and malaise/fatigue.  HENT: Negative.  Negative for ear discharge, ear pain, hearing loss, nosebleeds and tinnitus.   Eyes: Negative.  Negative for blurred vision, double vision, photophobia, pain and discharge.  Respiratory: Negative.  Negative for cough, hemoptysis, sputum production and shortness of breath.   Cardiovascular: Negative.  Negative for chest pain, palpitations, orthopnea, claudication and leg swelling.  Gastrointestinal: Negative.  Negative for nausea, vomiting, abdominal pain, diarrhea, constipation and blood in stool.  Genitourinary: Negative.  Negative for dysuria, urgency and frequency.  Musculoskeletal: Negative.  Negative for back pain, falls and neck pain.       He has bilateral pain in his shoulders.  Skin: Negative.  Negative for itching and rash.  Neurological: Positive for weakness. Negative for dizziness, tingling, tremors, sensory change, speech change, focal weakness, seizures, loss of consciousness and headaches.       The patient has some weakness  and unstable gait and is  ambulating with a walker  Endo/Heme/Allergies: Negative for environmental allergies. Does not bruise/bleed easily.  Psychiatric/Behavioral: Positive for depression. Negative for suicidal ideas, hallucinations, memory loss and substance abuse. The patient has insomnia. The patient is not nervous/anxious.     Blood pressure 136/83, pulse 90, temperature 98 F (36.7 C), temperature source Oral, resp. rate 18, height 6' (1.829 m), weight 104.327 kg (230 lb), SpO2 90 %.Body mass index is 31.19 kg/(m^2).  General Appearance: Disheveled  Eye Solicitor::  Fair  Speech:  Slow  Volume:  Decreased  Mood:  Dysphoric  Affect:  Flat  Thought Process:  Goal Directed  Orientation:  Full (Time, Place, and Person)  Thought Content:  Rumination  Suicidal Thoughts:  No  Homicidal Thoughts:  No  Memory:  Immediate;   Fair Recent;   Fair Remote;   Fair  Judgement:  Impaired  Insight:  Fair  Psychomotor Activity:  Decreased  Concentration:  Fair  Recall:  [employer] of Knowledge:Fair  Language: Fair  Akathisia:  No  Handed:  Right  AIMS (if indicated):     Assets:  Desire for Improvement Financial Resources/Insurance Housing Resilience Social Support  ADL's:  Impaired  Cognition: Impaired,  Mild  Sleep:  Number of Hours: 8    Christian [name] is a 65 year old married African-American male with a history of bipolar disorder, most recent episode depressed. He is currently undergoing ECT treatment with Dr. Toni Amend.   Bipolar disorder, type I, most recent episode depressed with psychotic behavior: The patient will continue with ECT treatment with Dr. Toni Amend. He has his next ECT treatment on Monday. Unfortunately, he remains depressed but no current active suicidal thoughts. The patient just recently had an increase in Seroquel 400 mg by mouth nightly. He is also on Remeron 45 mg by mouth nightly for depression.Consider Rexulti if mood does not improve. The patient is currently being  treated for both hyperlipidemia and diabetes I added lithium tonight as his renal function is slightly improved. Only 300 mg at night. Tolerated adding the lithium. We may consider going up on that. Mood today is reported to be slightly better although he is still always bedbound. At least he was a little more alert and interactive today. Denies suicidal ideation. It took this patient many treatments to get better last time and I would like to continue pushing on as his other options are minimal.  Hypertension: The patient will be maintained on Lasix 20 mg by mouth daily, lisinopril 40 mg by mouth daily and Lipitor 10 mg by mouth daily. He is also on aspirin 81 mg by mouth daily Vital signs are stable.  Diabetes: We'll check hemoglobin A1c. The patient is on metformin 500 mg by mouth twice a day. Will start checking blood sugars and SSI placed. He will be maintained on a carbohydrate limited diet  History of DVT: We'll continue on Eliquis was 5 mg by mouth daily  Hyperlipidemia: We'll continue on Lipitor 10 mg by mouth daily  Gout: The patient is currently on allopurinol 300 mg by mouth daily  Disposition:should actually does not have a stable place at this point because of his recent behavior and the concerns his wife has. This is a major problem for disposition and when I proposed to the patient that it may be keeping him depressed he agreed with me. 01/07/2016, 6:27 PM

## 2016-01-07 NOTE — Plan of Care (Signed)
Problem: Alteration in mood Goal: STG-Patient is able to discuss feelings and issues (Patient is able to discuss feelings and issues leading to depression)  Outcome: Progressing Pt discusses feelings towards his relationship with his wife and going to group with writer  Problem: Diagnosis: Increased Risk For Suicide Attempt Goal: STG-Patient Will Attend All Groups On The Unit Outcome: Not Progressing Pt does not attend groups on the unit

## 2016-01-07 NOTE — Anesthesia Postprocedure Evaluation (Signed)
Anesthesia Post Note  Patient: Christian [name]  Procedure(s) Performed: * No procedures listed *  Patient location during evaluation: PACU Anesthesia Type: General Level of consciousness: awake and alert Pain management: pain level controlled Vital Signs Assessment: post-procedure vital signs reviewed and stable Respiratory status: spontaneous breathing, nonlabored ventilation, respiratory function stable and patient connected to nasal cannula oxygen Cardiovascular status: blood pressure returned to baseline and stable Postop Assessment: no signs of nausea or vomiting Anesthetic complications: no    Last Vitals:  Filed Vitals:   01/07/16 1122 01/07/16 1426  BP: 139/94 136/83  Pulse:  90  Temp: 37.1 C 36.7 C  Resp: 16 18    Last Pain:  Filed Vitals:   01/07/16 1429  PainSc: 8                  Lenard Simmer

## 2016-01-07 NOTE — [facility] Group Notes (Signed)
[facility] Group Notes:  (Nursing/MHT/Case Management/Adjunct)  Date:  01/07/2016  Time:  1:59 PM  Type of Therapy:  Psychoeducational Skills  Participation Level:  Did Not Attend   Lynelle Smoke [facility] 01/07/2016, 1:59 PM

## 2016-01-07 NOTE — Anesthesia Procedure Notes (Signed)
Date/Time: 01/07/2016 10:33 AM Performed by: Lily Kocher Pre-anesthesia Checklist: Patient identified, Emergency Drugs available, Suction available and Patient being monitored Patient Re-evaluated:Patient Re-evaluated prior to inductionOxygen Delivery Method: Circle system utilized Preoxygenation: Pre-oxygenation with 100% oxygen Intubation Type: IV induction Ventilation: Mask ventilation without difficulty and Mask ventilation throughout procedure Airway Equipment and Method: Bite block Placement Confirmation: positive ETCO2 Dental Injury: Teeth and Oropharynx as per pre-operative assessment

## 2016-01-08 LAB — GLUCOSE, CAPILLARY
Glucose-Capillary: 142 mg/dL — ABNORMAL HIGH (ref 65–99)
Glucose-Capillary: 120 mg/dL — ABNORMAL HIGH (ref 65–99)
Glucose-Capillary: 125 mg/dL — ABNORMAL HIGH (ref 65–99)
Glucose-Capillary: 108 mg/dL — ABNORMAL HIGH (ref 65–99)
Glucose-Capillary: 121 mg/dL — ABNORMAL HIGH (ref 65–99)
Glucose-Capillary: 109 mg/dL — ABNORMAL HIGH (ref 65–99)
Glucose-Capillary: 114 mg/dL — ABNORMAL HIGH (ref 65–99)
Glucose-Capillary: 97 mg/dL (ref 65–99)

## 2016-01-08 NOTE — Plan of Care (Signed)
Problem: [facility] Participation in Recreation Therapeutic Interventions Goal: STG-Patient will demonstrate improved self esteem by identif STG: Self-Esteem - Within 4 treatment sessions, patient will verbalize at least 5 positive affirmation statements in each of 2 treatment sessions to increase self-esteem post d/c.  Outcome: Progressing Treatment Session 2; Completed 1 out of 2: At approximately 1:50 pm, LRT met with patient in patient room. Patient verbalized 5 positive affirmation statements. Patient reported he felt "strong". LRT encouraged patient to continue saying positive affirmation statements. Intervention Used: I Am statements  Leonette Monarch, LRT/CTRS 02.09.17 2:01 pm Goal: STG-Other Recreation Therapy Goal (Specify) STG: Stress Management - Within 4 treatment sessions, patient will verbalize understanding of the stress management techniques in each of 2 treatment sessions to increase stress management skills post d/c.  Outcome: Progressing Treatment Session 2; Completed 1 out of 2: At approximately 1:50 pm, LRT met with patient in patient room. LRT educated and provided patient with handouts on stress management techniques. Patient verbalized understanding. LRT encouraged patient to read over and practice the stress management techniques. Intervention Used: Stress Management handouts  Leonette Monarch, LRT/CTRS 02.09.17 2:03 pm

## 2016-01-08 NOTE — Progress Notes (Signed)
Recreation Therapy Notes  Date: 02.09.17 Time: 3:00 pm Location: Craft Room  Group Topic: Leisure Education  Goal Area(s) Addresses:  Patient will identify activities for each letter of the alphabet. Patient will verbalize ability to integrate positive leisure into life post d/c. Patient will verbalize ability to use leisure as a Associate Professor.  Behavioral Response: Did not attend  Intervention: Leisure Alphabet  Activity: Patients were given a Leisure Information systems manager and instructed to write healthy leisure activities for each letter of the alphabet.  Education: LRT educated patients on what they need to participate in leisure.  Education Outcome: Patient did not attend group.   Clinical Observations/Feedback: Patient did not attend group.  Jacquelynn Cree, LRT/CTRS 01/08/2016 3:54 PM

## 2016-01-08 NOTE — Progress Notes (Signed)
D:  Patient is alert and oriented on the unit this shift.  Patient did not attend or participate in groups today.  Patient denies suicidal ideation, homicidal ideation, auditory or visual hallucinations at the present time. Patient stayed in bed resting this a.m.  A:  Scheduled medications are administered to patient as per MD orders.  Emotional support and encouragement are provided.  Patient is maintained on q.15 minute safety checks.  Patient is informed to notify staff with questions or concerns. R:  No adverse medication reactions are noted.  Patient is cooperative with medication administration.  Patient is receptive, calm and cooperative on the unit at this time.  Patient does not interact with others on the unit this shift.  Patient contracts for safety at this time.  Patient remains safe at this time.

## 2016-01-08 NOTE — Plan of Care (Signed)
Problem: Ineffective individual coping Goal: STG: Patient will remain free from self harm Outcome: Progressing Patient remains free from self harm currently     

## 2016-01-08 NOTE — Plan of Care (Signed)
Problem: Ineffective individual coping Goal: STG-Increase in ability to manage activities of daily living Outcome: Progressing Patient manages his activities of daily living currently

## 2016-01-08 NOTE — Progress Notes (Signed)
D: Observed pt walking in hallway. Patient alert and oriented x4. Patient denies SI/HI/AVH. Pt affect is flat. Pt was seen being more physically active on the unit walking around, but does not appears to be interacting much with peers or staff. Pt forwards little. Pt rated depression 8/10 and anxiety 8/10, and pt indicated to writer that his mood is not improving. Pt continues to not attend groups. Pt c/o constipation, stating it had been 4 days since his last bowel movement.  A: Offered active listening and support. Provided therapeutic communication. Administered scheduled medications. Gave milk of magnesia prn. Encouraged pt attend groups and actively participate in care. R: Pt nodded in agreement to attend groups, but gave no real commitment to do so. Pt pleasant and cooperative. Pt medication compliant. Will continue Q15 min. checks. Safety maintained.

## 2016-01-08 NOTE — Plan of Care (Signed)
Problem: Ineffective individual coping Goal: LTG: Patient will report a decrease in negative feelings Outcome: Not Progressing Pt rates depression 8/10 and indicated that his mood is not improving.

## 2016-01-08 NOTE — [facility] Group Notes (Signed)
[facility] LCSW Group Therapy  01/08/2016 4:06 PM  Type of Therapy:  Group Therapy  Participation Level:  Did Not Attend  Summary of Progress/Problems: Patient was called to group but did not attend.  Lulu Riding, MSW, LCSWA 01/08/2016, 4:06 PM

## 2016-01-09 ENCOUNTER — Inpatient Hospital Stay: Payer: Commercial Managed Care - HMO | Admitting: Anesthesiology

## 2016-01-09 ENCOUNTER — Inpatient Hospital Stay: Payer: Commercial Managed Care - HMO

## 2016-01-09 ENCOUNTER — Encounter: Payer: Self-pay | Admitting: *Deleted

## 2016-01-09 LAB — CBC
HCT: 42.8 % (ref 40.0–52.0)
Hemoglobin: 14.2 g/dL (ref 13.0–18.0)
MCH: 29.1 pg (ref 26.0–34.0)
MCHC: 33.2 g/dL (ref 32.0–36.0)
MCV: 87.7 fL (ref 80.0–100.0)
Platelets: 158 10*3/uL (ref 150–440)
RBC: 4.88 MIL/uL (ref 4.40–5.90)
RDW: 13 % (ref 11.5–14.5)
WBC: 4 10*3/uL (ref 3.8–10.6)

## 2016-01-09 LAB — CREATININE, SERUM
Creatinine, Ser: 1.34 mg/dL — ABNORMAL HIGH (ref 0.61–1.24)
GFR calc Af Amer: >60 mL/min (ref >60)
GFR calc non Af Amer: 55 mL/min — ABNORMAL LOW (ref >60)

## 2016-01-09 MED ORDER — LABETALOL HCL 5 MG/ML IV SOLN
INTRAVENOUS | Status: DC | PRN
  Administered 2016-01-09 (×2): 20 mg via INTRAVENOUS

## 2016-01-09 MED ORDER — ONDANSETRON 4 MG PO TBDP
4.0000 mg | ORAL_TABLET | Freq: Three times a day (TID) | ORAL | Status: DC | PRN
  Administered 2016-01-09: 4 mg via ORAL
  Filled 2016-01-09 (×3): qty 1

## 2016-01-09 MED ORDER — SODIUM CHLORIDE 0.9 % IV SOLN
250.0000 mL | Freq: Once | INTRAVENOUS | Status: AC
  Administered 2016-01-09: 250 mL via INTRAVENOUS

## 2016-01-09 MED ORDER — METOPROLOL TARTRATE 25 MG PO TABS
12.5000 mg | ORAL_TABLET | Freq: Two times a day (BID) | ORAL | Status: DC
  Administered 2016-01-09 – 2016-01-18 (×19): 12.5 mg via ORAL
  Filled 2016-01-09 (×21): qty 1

## 2016-01-09 MED ORDER — SUCCINYLCHOLINE 20MG/ML (10ML) SYRINGE FOR MEDFUSION PUMP - OPTIME
INTRAMUSCULAR | Status: DC | PRN
  Administered 2016-01-09: 150 mg via INTRAVENOUS

## 2016-01-09 MED ORDER — SODIUM CHLORIDE 0.9 % IV SOLN
INTRAVENOUS | Status: DC | PRN
  Administered 2016-01-09 (×2): via INTRAVENOUS

## 2016-01-09 MED ORDER — KETAMINE HCL 10 MG/ML IJ SOLN
INTRAMUSCULAR | Status: DC | PRN
  Administered 2016-01-09: 120 mg via INTRAVENOUS

## 2016-01-09 MED ORDER — LITHIUM CARBONATE ER 450 MG PO TBCR
450.0000 mg | EXTENDED_RELEASE_TABLET | Freq: Every day | ORAL | Status: DC
  Administered 2016-01-09 – 2016-01-15 (×7): 450 mg via ORAL
  Filled 2016-01-09 (×7): qty 1

## 2016-01-09 NOTE — Anesthesia Procedure Notes (Signed)
Date/Time: 01/09/2016 11:34 AM Performed by: Lily Kocher Pre-anesthesia Checklist: Patient identified, Emergency Drugs available, Suction available and Patient being monitored Patient Re-evaluated:Patient Re-evaluated prior to inductionOxygen Delivery Method: Circle system utilized Preoxygenation: Pre-oxygenation with 100% oxygen Intubation Type: IV induction Ventilation: Mask ventilation without difficulty and Mask ventilation throughout procedure Airway Equipment and Method: Bite block Placement Confirmation: positive ETCO2 Dental Injury: Teeth and Oropharynx as per pre-operative assessment

## 2016-01-09 NOTE — [facility] Group Notes (Signed)
[facility] LCSW Group Therapy  01/09/2016 4:17 PM  Type of Therapy:  Group Therapy  Participation Level:  Did Not Attend  Summary of Progress/Problems: Patient is scheduled ECT and did not attend group.  Lulu Riding, MSW, LCSWA 01/09/2016, 4:17 PM

## 2016-01-09 NOTE — Progress Notes (Signed)
[facility] MD Progress Note  01/09/2016 4:45 PM Christian [name]  MRN:  [id number] Subjective:  Follow-up for 65 year old man with severe depression as part of bipolar disorder. Patient had ECT this morning. Tolerated well. Patient seen this afternoon and as usual he is wide awake lying motionless in bed. He did speak to me a little bit more than usual. Told me that he has been worrying about what will happen to him if [name] does not allow him to come home. This is a very realistic worry because [name] has made it clear to me that she wants [place] to look into sending him to [facility]. Discussed with the patient the fact that he is not getting better very quickly. He still lays in bed all the time. Today he is denying suicidal ideation. Principal Problem: Bipolar disorder, current episode depressed, severe, with psychotic features ([place]) Diagnosis:   Patient Active Problem List   Diagnosis Date Noted  . Bipolar I disorder, most recent episode depressed, severe w psychosis ([place]) [F31.5] 12/22/2015  . Bipolar affective disorder, depressed, severe, with psychotic behavior ([place]) [F31.5]   . Bipolar I disorder, current or most recent episode depressed, with psychotic features ([place]) [F31.5]   . Bipolar I disorder, most recent episode depressed ([place]) [F31.30]   . Severe bipolar I disorder with depression ([place]) [F31.4]   . Bipolar I disorder, most recent episode depressed, severe with psychotic features ([place]) [F31.5]   . Overdose [T50.901A] 09/11/2015  . Acute respiratory failure with hypoxia ([place]) [J96.01] 09/05/2015  . Elevated troponin [R79.89] 09/05/2015  . Somnolence [R40.0] 09/05/2015  . Bipolar depression ([place]) [F31.30] 09/05/2015  . Acute bilateral deep vein thrombosis (DVT) of femoral veins (H[facility]) [I82.413] 09/05/2015  . Bilateral pulmonary embolism ([place]) [I26.99] 09/02/2015  . Labile hypertension [I10]   . Pulmonary embolism with acute cor pulmonale ([place]) [I26.09]   . Dyspnea [R06.00]    . Orthostatic hypotension [I95.1]   . Bipolar disorder, in partial remission, most recent episode depressed ([place]) [F31.75]   . Syncope, near [R55] 08/31/2015  . Affective psychosis, bipolar ([place]) [F31.9]   . Bipolar disorder, now depressed ([place]) [F31.30] 08/28/2015  . Bipolar I disorder, severe, current or most recent episode depressed, with psychotic features ([place]) [F31.5]   . Pressure ulcer [L89.90] 07/30/2015  . Protein-calorie malnutrition, severe ([place]) [E43] 07/30/2015  . Dehydration [E86.0] 07/29/2015  . Bipolar disorder, current episode depressed, severe, with psychotic features ([place]) [F31.5] 07/09/2015  . Diabetes ([place]) [E11.9] 07/09/2015  . UTI (urinary tract infection) [N39.0] 06/19/2014  . Positive blood culture [R78.81] 06/19/2014  . Bipolar affective disorder, current episode manic with psychotic symptoms ([place]) [F31.2] 06/17/2014  . Type II or unspecified type diabetes mellitus with unspecified complication, uncontrolled [E11.8, E11.65] 06/02/2014  . Other and unspecified hyperlipidemia [E78.5] 06/02/2014  . OSA on CPAP [G47.33] 06/02/2014  . 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 Gout [M10.9] 06/02/2014  . Hypertension [I10]    Total Time spent with patient: 30 minutes  Past Psychiatric History: Long history of bipolar disorder with history of suicide attempts and now a history of aggression  Past Medical History:  Past Medical History  Diagnosis Date  . Mood swings ([place])   . Type II or unspecified type diabetes mellitus with unspecified complication, uncontrolled 06/02/2014  . Other and unspecified hyperlipidemia 06/02/2014  . OSA on CPAP 06/02/2014  . 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 Bipolar disorder ([place])   .  Hypertension   . Gout   . Bipolar 1 disorder ([employer])   . Hx of blood clots    History reviewed. No pertinent past surgical history. Family History:  Family History   Problem Relation Age of Onset  . Dementia Mother   . Arthritis Father   . Hypertension Father   . Heart failure Neg Hx   . Kidney failure Neg Hx   . Cancer Neg Hx   . Mental illness Sister   . Diabetes Sister    Family Psychiatric  History: Positive for bipolar disorder and anxiety Social History:  History  Alcohol Use No     History  Drug Use No    Social History   Social History  . Marital Status: Married    Spouse Name: N/A  . Number of Children: N/A  . Years of Education: N/A   Social History Main Topics  . Smoking status: Never Smoker   . Smokeless tobacco: None  . Alcohol Use: No  . Drug Use: No  . Sexual Activity: Not Currently   Other Topics Concern  . None   Social History Narrative   *The patient has been married for over 30 years and is currently on disability. He lives with his wife in the [place] area and has one daughter.             Additional Social History:                         Sleep: Poor  Appetite:  Fair  Current Medications: Current Facility-Administered Medications  Medication Dose Route Frequency Provider Last Rate Last Dose  . acetaminophen (TYLENOL) tablet 650 mg  650 mg Oral Q6H PRN Gonzella Lex, MD   650 mg at 01/07/16 1418  . allopurinol (ZYLOPRIM) tablet 300 mg  300 mg Oral Daily Gonzella Lex, MD   300 mg at 01/09/16 1243  . alum & mag hydroxide-simeth (MAALOX/MYLANTA) 200-200-20 MG/5ML suspension 30 mL  30 mL Oral Q4H PRN Gonzella Lex, MD      . apixaban (ELIQUIS) tablet 5 mg  5 mg Oral BID Gonzella Lex, MD   5 mg at 01/09/16 1243  . aspirin EC tablet 81 mg  81 mg Oral Daily Gonzella Lex, MD   81 mg at 01/09/16 1243  . atorvastatin (LIPITOR) tablet 10 mg  10 mg Oral q1800 Gonzella Lex, MD   10 mg at 01/08/16 1720  . docusate sodium (COLACE) capsule 100 mg  100 mg Oral BID Gonzella Lex, MD   100 mg at 01/09/16 1243  . fentaNYL (SUBLIMAZE) injection 25 mcg  25 mcg Intravenous Q5 min PRN Gunnar Bulla,  MD      . fentaNYL (SUBLIMAZE) injection 25 mcg  25 mcg Intravenous Q5 min PRN Gijsbertus Lonia Mad, MD      . finasteride (PROSCAR) tablet 5 mg  5 mg Oral Daily Gonzella Lex, MD   5 mg at 01/09/16 1243  . furosemide (LASIX) tablet 20 mg  20 mg Oral Daily Gonzella Lex, MD   20 mg at 01/09/16 1243  . insulin aspart (novoLOG) injection 0-9 Units  0-9 Units Subcutaneous TID WC Chauncey Mann, MD   1 Units at 01/08/16 1719  . lisinopril (PRINIVIL,ZESTRIL) tablet 40 mg  40 mg Oral Daily Jolanta B Pucilowska, MD   40 mg at 01/09/16 1242  . lithium carbonate (ESKALITH) CR tablet 450 mg  450 mg Oral QHS Gonzella Lex, MD      . magnesium hydroxide (MILK OF MAGNESIA) suspension 30 mL  30 mL Oral Daily PRN Gonzella Lex, MD   30 mL at 01/08/16 2315  . metFORMIN (GLUCOPHAGE) tablet 500 mg  500 mg Oral BID WC Gonzella Lex, MD   500 mg at 01/09/16 1244  . metoprolol tartrate (LOPRESSOR) tablet 12.5 mg  12.5 mg Oral BID Gonzella Lex, MD      . mirtazapine (REMERON) tablet 45 mg  45 mg Oral QHS Gonzella Lex, MD   45 mg at 01/08/16 2315  . ondansetron (ZOFRAN) injection 4 mg  4 mg Intravenous Once PRN Gunnar Bulla, MD      . ondansetron [facility]) injection 4 mg  4 mg Intravenous Once PRN Gijsbertus Lonia Mad, MD      . ondansetron (ZOFRAN-ODT) disintegrating tablet 4 mg  4 mg Oral Q8H PRN Gonzella Lex, MD   4 mg at 01/09/16 1340  . polyethylene glycol (MIRALAX / GLYCOLAX) packet 17 g  17 g Oral Daily Gonzella Lex, MD   17 g at 01/08/16 0834  . QUEtiapine (SEROQUEL) tablet 400 mg  400 mg Oral QHS Gonzella Lex, MD   400 mg at 01/08/16 2315  . tamsulosin (FLOMAX) capsule 0.4 mg  0.4 mg Oral QPC supper Gonzella Lex, MD   0.4 mg at 01/08/16 1720    Lab Results:  Results for orders placed or performed during the hospital encounter of 12/22/15 (from the past 48 hour(s))  Creatinine, serum     Status: Abnormal   Collection Time: 01/09/16  6:30 AM  Result Value Ref Range   Creatinine, Ser  1.34 (H) 0.61 - 1.24 mg/dL   GFR calc non Af Amer 55 (L) >60 mL/min   GFR calc Af Amer >60 >60 mL/min    Comment: (NOTE) The eGFR has been calculated using the CKD EPI equation. This calculation has not been validated in all clinical situations. eGFR's persistently <60 mL/min signify possible Chronic Kidney Disease.   CBC     Status: None   Collection Time: 01/09/16  6:30 AM  Result Value Ref Range   WBC 4.0 3.8 - 10.6 K/uL   RBC 4.88 4.40 - 5.90 MIL/uL   Hemoglobin 14.2 13.0 - 18.0 g/dL   HCT 42.8 40.0 - 52.0 %   MCV 87.7 80.0 - 100.0 fL   MCH 29.1 26.0 - 34.0 pg   MCHC 33.2 32.0 - 36.0 g/dL   RDW 13.0 11.5 - 14.5 %   Platelets 158 150 - 440 K/uL    Physical Findings: AIMS: Facial and Oral Movements Muscles of Facial Expression: None, normal Lips and Perioral Area: None, normal Jaw: None, normal Tongue: None, normal,Extremity Movements Upper (arms, wrists, hands, fingers): None, normal Lower (legs, knees, ankles, toes): None, normal, Trunk Movements Neck, shoulders, hips: None, normal, Overall Severity Severity of abnormal movements (highest score from questions above): None, normal Incapacitation due to abnormal movements: None, normal Patient's awareness of abnormal movements (rate only patient's report): No Awareness, Dental Status Current problems with teeth and/or dentures?: No Does patient usually wear dentures?: No  CIWA:    COWS:     Musculoskeletal: Strength & Muscle Tone: decreased Gait & Station: shuffle Patient leans: N/A  Psychiatric Specialty Exam: Review of Systems  HENT: Negative.   Eyes: Negative.   Respiratory: Negative.   Cardiovascular: Negative.   Gastrointestinal: Negative.   Musculoskeletal: Negative.  Skin: Negative.   Neurological: Positive for weakness.  Psychiatric/Behavioral: Positive for depression and memory loss. Negative for suicidal ideas, hallucinations and substance abuse. The patient is nervous/anxious and has insomnia.      Blood pressure 155/120, pulse 95, temperature 97.5 F (36.4 C), temperature source Tympanic, resp. rate 16, height 6' (1.829 m), weight 102.967 kg (227 lb), SpO2 100 %.Body mass index is 30.78 kg/(m^2).  General Appearance: Casual  Eye Contact::  Fair  Speech:  Slow  Volume:  Decreased  Mood:  Dysphoric  Affect:  Constricted  Thought Process:  Tangential  Orientation:  Full (Time, Place, and Person)  Thought Content:  Rumination  Suicidal Thoughts:  No  Homicidal Thoughts:  No  Memory:  Immediate;   Fair Recent;   Fair Remote;   Fair  Judgement:  Poor  Insight:  Fair  Psychomotor Activity:  Psychomotor Retardation  Concentration:  Fair  Recall:  [employer] of Knowledge:Fair  Language: Fair  Akathisia:  No  Handed:  Right  AIMS (if indicated):     Assets:  Desire for Improvement Resilience Social Support  ADL's:  Impaired  Cognition: Impaired,  Mild  Sleep:  Number of Hours: 7.5   Treatment Plan Summary: Daily contact with patient to assess and evaluate symptoms and progress in treatment, Medication management and Plan Patient very slightly better today. Not sure if this is really represents clear improvement. I do know that it took him a large number of treatments, over 12, last time before he got much better. Therefore I am wanting to try to continue if possible. I am going to increase his lithium dose tonight to 450 mg. I noticed that he's been running an unusually high blood pressure during the day the last couple days. I'm going to add a small dose of metoprolol to his blood pressure medicine. Supportive counseling and encouragement and tried to get him to agree to get up out of bed and go to groups.  Alethia Berthold, MD 01/09/2016, 4:45 PM

## 2016-01-09 NOTE — Progress Notes (Signed)
D: Observed pt laying in room. Patient alert and oriented x4. Patient denies SI/HI/AVH. Pt affect is flat. Pt stated "The day's about the same." Pt forwards little and interacts minimally. Pt not going to groups. Writer observed a paper in the pt's room with positive affirmations written on it. Pt continues to c/o of constipation. A: Offered active listening and support. Provided therapeutic communication. Administered scheduled medications. Offered milk of magnesia prn. R: Pt refused milk of magnesia. Pt pleasant and cooperative. Pt medication compliant. Will continue Q15 min. checks. Safety maintained.

## 2016-01-09 NOTE — Transfer of Care (Signed)
Immediate Anesthesia Transfer of Care Note  Patient: Christian [name]  Procedure(s) Performed: ECT  Patient Location: PACU  Anesthesia Type:General  Level of Consciousness: sedated  Airway & Oxygen Therapy: Patient Spontanous Breathing and Patient connected to face mask oxygen  Post-op Assessment: Report given to RN  Post vital signs: Reviewed and stable  Last Vitals:  Filed Vitals:   01/09/16 0827 01/09/16 1148  BP: 120/82 171/116  Pulse: 96 94  Temp: 35.8 C   Resp:  17    Complications: No apparent anesthesia complications

## 2016-01-09 NOTE — Plan of Care (Signed)
Problem: Ineffective individual coping Goal: STG: Patient will remain free from self harm Outcome: Progressing Pt remains free from self harm.      

## 2016-01-09 NOTE — Progress Notes (Signed)
[facility] MD Progress Note  01/09/2016 12:35 AM Christian [name]  MRN:  [id number] Subjective:  Follow-up for 66 year old man with a history of bipolar disorder depressed. Has been receiving ECT. Despite 3 times a week bilateral ECT his mood has been getting worse.Patient today claimed to feel better but his actions are still very depressed with no participation [facility] MD Progress Note  01/09/2016 12:35 AM Christian [name]  MRN:  [id number]    Subjective:   Patient says he is feeling a little bit better today. Seems to be making an effort to try and be more optimistic. Denies suicidal ideation this evening. He is agreeable to continuing with ECT and is hopeful it will improve his mood.  Past Psychiatric History: Patient has a long history of bipolar disorder with both depressed and manic phases in the past a history of suicide attempts and now a history of aggression towards his wife. Has responded well to ECT but is very fragile and his illness with a tendency to relapse quickly.   Social history: The patient was born and raised in [place] by both his biological parents. 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 He has been married for 35 years and has one daughter. He is currently in disability.  Substance abuse history: The patient denies any history of any heavy alcohol use or illicit drug use   Legal history: The patient denies any prior arrest or incarcerations.   Principal Problem: Bipolar disorder, current episode depressed, severe, with psychotic features (HCC) Diagnosis:   Patient Active Problem List   Diagnosis Date Noted  . Bipolar I disorder, most recent episode depressed, severe w psychosis (HCC) [F31.5] 12/22/2015  . Bipolar affective disorder, depressed, severe, with psychotic behavior (HCC) [F31.5]   . Bipolar I disorder, current or most recent episode depressed, with psychotic features (HCC) [F31.5]   . Bipolar I disorder, most recent episode  depressed (HCC) [F31.30]   . Severe bipolar I disorder with depression (HCC) [F31.4]   . Bipolar I disorder, most recent episode depressed, severe with psychotic features (HCC) [F31.5]   . Overdose [T50.901A] 09/11/2015  . Acute respiratory failure with hypoxia (HCC) [J96.01] 09/05/2015  . Elevated troponin [R79.89] 09/05/2015  . Somnolence [R40.0] 09/05/2015  . Bipolar depression (HCC) [F31.30] 09/05/2015  . Acute bilateral deep vein thrombosis (DVT) of femoral veins (HCC) [I82.413] 09/05/2015  . Bilateral pulmonary embolism (HCC) [I26.99] 09/02/2015  . Labile hypertension [I10]   . Pulmonary embolism with acute cor pulmonale (HCC) [I26.09]   . Dyspnea [R06.00]   . Orthostatic hypotension [I95.1]   . Bipolar disorder, in partial remission, most recent episode depressed (HCC) [F31.75]   . Syncope, near [R55] 08/31/2015  . Affective psychosis, bipolar (HCC) [F31.9]   . Bipolar disorder, now depressed (HCC) [F31.30] 08/28/2015  . Bipolar I disorder, severe, current or most recent episode depressed, with psychotic features (HCC) [F31.5]   . Pressure ulcer [L89.90] 07/30/2015  . Protein-calorie malnutrition, severe (HCC) [E43] 07/30/2015  . Dehydration [E86.0] 07/29/2015  . Bipolar disorder, current episode depressed, severe, with psychotic features (HCC) [F31.5] 07/09/2015  . Diabetes (HCC) [E11.9] 07/09/2015  . UTI (urinary tract infection) [N39.0] 06/19/2014  . Positive blood culture [R78.81] 06/19/2014  . Bipolar affective disorder, current episode manic with psychotic symptoms (HCC) [F31.2] 06/17/2014  . Type II or unspecified type diabetes mellitus with unspecified complication, uncontrolled [E11.8, E11.65] 06/02/2014  . Other and unspecified hyperlipidemia [E78.5] 06/02/2014  . OSA on CPAP [G47.33] 06/02/2014  .  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 Gout [M10.9] 06/02/2014  . Hypertension [I10]    Total Time spent with patient:  30 minutes   Past Medical History:  Past Medical History  Diagnosis Date  . Mood swings (HCC)   . Type II or unspecified type diabetes mellitus with unspecified complication, uncontrolled 06/02/2014  . Other and unspecified hyperlipidemia 06/02/2014  . OSA on CPAP 06/02/2014  . 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 Bipolar disorder (HCC)   . Hypertension   . Gout   . Bipolar 1 disorder (HCC)   . Hx of blood clots    History reviewed. No pertinent past surgical history. Family History:  Family History  Problem Relation Age of Onset  . Dementia Mother   . Arthritis Father   . Hypertension Father   . Heart failure Neg Hx   . Kidney failure Neg Hx   . Cancer Neg Hx   . Mental illness Sister   . Diabetes Sister    Family Psychiatric  History: Positive family history for anxiety and bipolar disorder Social History:  History  Alcohol Use No     History  Drug Use No    Social History   Social History  . Marital Status: Married    Spouse Name: N/A  . Number of Children: N/A  . Years of Education: N/A   Social History Main Topics  . Smoking status: Never Smoker   . Smokeless tobacco: None  . Alcohol Use: No  . Drug Use: No  . Sexual Activity: Not Currently   Other Topics Concern  . None   Social History Narrative   *The patient has been married for over 30 years and is currently on disability. He lives with his wife in the [place] area and has one daughter.             Sleep: Fair  Appetite:  Fair  Current Medications: Current Facility-Administered Medications  Medication Dose Route Frequency Provider Last Rate Last Dose  . acetaminophen (TYLENOL) tablet 650 mg  650 mg Oral Q6H PRN Audery Amel, MD   650 mg at 01/07/16 1418  . allopurinol (ZYLOPRIM) tablet 300 mg  300 mg Oral Daily Audery Amel, MD   300 mg at 01/08/16 0835  . alum & mag hydroxide-simeth (MAALOX/MYLANTA) 200-200-20 MG/5ML suspension 30 mL  30 mL Oral  Q4H PRN Audery Amel, MD      . apixaban (ELIQUIS) tablet 5 mg  5 mg Oral BID Audery Amel, MD   5 mg at 01/08/16 2315  . aspirin EC tablet 81 mg  81 mg Oral Daily Audery Amel, MD   81 mg at 01/08/16 4098  . atorvastatin (LIPITOR) tablet 10 mg  10 mg Oral q1800 Audery Amel, MD   10 mg at 01/08/16 1720  . docusate sodium (COLACE) capsule 100 mg  100 mg Oral BID Audery Amel, MD   100 mg at 01/08/16 2315  . fentaNYL (SUBLIMAZE) injection 25 mcg  25 mcg Intravenous Q5 min PRN Berdine Addison, MD      . fentaNYL (SUBLIMAZE) injection 25 mcg  25 mcg Intravenous Q5 min PRN Gijsbertus Georgana Curio, MD      . finasteride (PROSCAR) tablet 5 mg  5 mg Oral Daily Audery Amel, MD   5 mg at 01/08/16 0836  . furosemide (LASIX) tablet 20 mg  20 mg Oral  Daily Audery Amel, MD   20 mg at 01/08/16 0836  . insulin aspart (novoLOG) injection 0-9 Units  0-9 Units Subcutaneous TID WC Darliss Ridgel, MD   1 Units at 01/08/16 1719  . lisinopril (PRINIVIL,ZESTRIL) tablet 40 mg  40 mg Oral Daily Shari Prows, MD   40 mg at 01/08/16 0836  . lithium carbonate (LITHOBID) CR tablet 300 mg  300 mg Oral QHS Audery Amel, MD   300 mg at 01/08/16 2315  . magnesium hydroxide (MILK OF MAGNESIA) suspension 30 mL  30 mL Oral Daily PRN Audery Amel, MD   30 mL at 01/08/16 2315  . metFORMIN (GLUCOPHAGE) tablet 500 mg  500 mg Oral BID WC Audery Amel, MD   500 mg at 01/08/16 1720  . mirtazapine (REMERON) tablet 45 mg  45 mg Oral QHS Audery Amel, MD   45 mg at 01/08/16 2315  . ondansetron (ZOFRAN) injection 4 mg  4 mg Intravenous Once PRN Berdine Addison, MD      . ondansetron [facility]) injection 4 mg  4 mg Intravenous Once PRN Gijsbertus F Darleene Cleaver, MD      . polyethylene glycol (MIRALAX / GLYCOLAX) packet 17 g  17 g Oral Daily Audery Amel, MD   17 g at 01/08/16 0834  . QUEtiapine (SEROQUEL) tablet 400 mg  400 mg Oral QHS Audery Amel, MD   400 mg at 01/08/16 2315  . tamsulosin (FLOMAX) capsule 0.4  mg  0.4 mg Oral QPC supper Audery Amel, MD   0.4 mg at 01/08/16 1720    Lab Results:  Results for orders placed or performed during the hospital encounter of 12/26/15 (from the past 48 hour(s))  Glucose, capillary     Status: Abnormal   Collection Time: 01/07/16  6:42 AM  Result Value Ref Range   Glucose-Capillary 108 (H) 65 - 99 mg/dL  Glucose, capillary     Status: Abnormal   Collection Time: 01/07/16 11:06 AM  Result Value Ref Range   Glucose-Capillary 121 (H) 65 - 99 mg/dL  Glucose, capillary     Status: Abnormal   Collection Time: 01/07/16  5:44 PM  Result Value Ref Range   Glucose-Capillary 109 (H) 65 - 99 mg/dL  Glucose, capillary     Status: Abnormal   Collection Time: 01/07/16  9:27 PM  Result Value Ref Range   Glucose-Capillary 114 (H) 65 - 99 mg/dL   Comment 1 Notify RN   Glucose, capillary     Status: None   Collection Time: 01/08/16  6:51 AM  Result Value Ref Range   Glucose-Capillary 97 65 - 99 mg/dL    Physical Findings: AIMS: Facial and Oral Movements Muscles of Facial Expression: None, normal Lips and Perioral Area: None, normal Jaw: None, normal Tongue: None, normal,Extremity Movements Upper (arms, wrists, hands, fingers): None, normal Lower (legs, knees, ankles, toes): None, normal, Trunk Movements Neck, shoulders, hips: None, normal, Overall Severity Severity of abnormal movements (highest score from questions above): None, normal Incapacitation due to abnormal movements: None, normal Patient's awareness of abnormal movements (rate only patient's report): No Awareness, Dental Status Current problems with teeth and/or dentures?: No Does patient usually wear dentures?: No      Musculoskeletal: Strength & Muscle Tone: within normal limits Gait & Station: unsteady Patient leans: N/A  Psychiatric Specialty Exam: Review of Systems  Constitutional: Negative for fever, chills, weight loss and malaise/fatigue.  HENT: Negative.  Negative for ear  discharge,  ear pain, hearing loss, nosebleeds and tinnitus.   Eyes: Negative.  Negative for blurred vision, double vision, photophobia, pain and discharge.  Respiratory: Negative.  Negative for cough, hemoptysis, sputum production and shortness of breath.   Cardiovascular: Negative.  Negative for chest pain, palpitations, orthopnea, claudication and leg swelling.  Gastrointestinal: Negative.  Negative for nausea, vomiting, abdominal pain, diarrhea, constipation and blood in stool.  Genitourinary: Negative.  Negative for dysuria, urgency and frequency.  Musculoskeletal: Negative.  Negative for back pain, falls and neck pain.       He has bilateral pain in his shoulders.  Skin: Negative.  Negative for itching and rash.  Neurological: Positive for weakness. Negative for dizziness, tingling, tremors, sensory change, speech change, focal weakness, seizures, loss of consciousness and headaches.       The patient has some weakness and unstable gait and is ambulating with a walker  Endo/Heme/Allergies: Negative for environmental allergies. Does not bruise/bleed easily.  Psychiatric/Behavioral: Positive for depression. Negative for suicidal ideas, hallucinations, memory loss and substance abuse. The patient has insomnia. The patient is not nervous/anxious.     Blood pressure 119/82, pulse 102, temperature 98.5 F (36.9 C), temperature source Oral, resp. rate 18, height 6' (1.829 m), weight 104.327 kg (230 lb), SpO2 90 %.Body mass index is 31.19 kg/(m^2).  General Appearance: Disheveled  Eye Solicitor::  Fair  Speech:  Slow  Volume:  Decreased  Mood:  Dysphoric  Affect:  Flat  Thought Process:  Goal Directed  Orientation:  Full (Time, Place, and Person)  Thought Content:  Rumination  Suicidal Thoughts:  No  Homicidal Thoughts:  No  Memory:  Immediate;   Fair Recent;   Fair Remote;   Fair  Judgement:  Impaired  Insight:  Fair  Psychomotor Activity:  Decreased  Concentration:  Fair  Recall:  [employer] of Knowledge:Fair  Language: Fair  Akathisia:  No  Handed:  Right  AIMS (if indicated):     Assets:  Desire for Improvement Financial Resources/Insurance Housing Resilience Social Support  ADL's:  Impaired  Cognition: Impaired,  Mild  Sleep:  Number of Hours: 7.25    Mr. Kershaw is a 65 year old married African-American male with a history of bipolar disorder, most recent episode depressed. He is currently undergoing ECT treatment with Dr. Toni Amend.   Bipolar disorder, type I, most recent episode depressed with psychotic behavior: The patient will continue with ECT treatment with Dr. Toni Amend. He has his next ECT treatment on Monday. Unfortunately, he remains depressed but no current active suicidal thoughts. The patient just recently had an increase in Seroquel 400 mg by mouth nightly. He is also on Remeron 45 mg by mouth nightly for depression.Consider Rexulti if mood does not improve. The patient is currently being treated for both hyperlipidemia and diabetes  Hypertension: The patient will be maintained on Lasix 20 mg by mouth daily, lisinopril 40 mg by mouth daily and Lipitor 10 mg by mouth daily. He is also on aspirin 81 mg by mouth daily Vital signs are stable.  Diabetes: We'll check hemoglobin A1c. The patient is on metformin 500 mg by mouth twice a day. Will start checking blood sugars and SSI placed. He will be maintained on a carbohydrate limited diet  History of DVT: We'll continue on Eliquis was 5 mg by mouth daily  Hyperlipidemia: We'll continue on Lipitor 10 mg by mouth daily  Gout: The patient is currently on allopurinol 300 mg by mouth daily  Disposition: The patient does have a  stable living situation. He will follow-up with Dr. Emerson Monte after discharge  01/09/2016, 12:35 AM  Principal Problem: Bipolar disorder, current episode depressed, severe, with psychotic features [facility]) Diagnosis:   Patient Active Problem List   Diagnosis Date Noted  . Bipolar  I disorder, most recent episode depressed, severe w psychosis (HCC) [F31.5] 12/22/2015  . Bipolar affective disorder, depressed, severe, with psychotic behavior (HCC) [F31.5]   . Bipolar I disorder, current or most recent episode depressed, with psychotic features (HCC) [F31.5]   . Bipolar I disorder, most recent episode depressed (HCC) [F31.30]   . Severe bipolar I disorder with depression (HCC) [F31.4]   . Bipolar I disorder, most recent episode depressed, severe with psychotic features (HCC) [F31.5]   . Overdose [T50.901A] 09/11/2015  . Acute respiratory failure with hypoxia (HCC) [J96.01] 09/05/2015  . Elevated troponin [R79.89] 09/05/2015  . Somnolence [R40.0] 09/05/2015  . Bipolar depression (HCC) [F31.30] 09/05/2015  . Acute bilateral deep vein thrombosis (DVT) of femoral veins (HCC) [I82.413] 09/05/2015  . Bilateral pulmonary embolism (HCC) [I26.99] 09/02/2015  . Labile hypertension [I10]   . Pulmonary embolism with acute cor pulmonale (HCC) [I26.09]   . Dyspnea [R06.00]   . Orthostatic hypotension [I95.1]   . Bipolar disorder, in partial remission, most recent episode depressed (HCC) [F31.75]   . Syncope, near [R55] 08/31/2015  . Affective psychosis, bipolar (HCC) [F31.9]   . Bipolar disorder, now depressed (HCC) [F31.30] 08/28/2015  . Bipolar I disorder, severe, current or most recent episode depressed, with psychotic features (HCC) [F31.5]   . Pressure ulcer [L89.90] 07/30/2015  . Protein-calorie malnutrition, severe (HCC) [E43] 07/30/2015  . Dehydration [E86.0] 07/29/2015  . Bipolar disorder, current episode depressed, severe, with psychotic features (HCC) [F31.5] 07/09/2015  . Diabetes (HCC) [E11.9] 07/09/2015  . UTI (urinary tract infection) [N39.0] 06/19/2014  . Positive blood culture [R78.81] 06/19/2014  . Bipolar affective disorder, current episode manic with psychotic symptoms (HCC) [F31.2] 06/17/2014  . Type II or unspecified type diabetes mellitus with unspecified  complication, uncontrolled [E11.8, E11.65] 06/02/2014  . Other and unspecified hyperlipidemia [E78.5] 06/02/2014  . OSA on CPAP [G47.33] 06/02/2014  . 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 Gout [M10.9] 06/02/2014  . Hypertension [I10]    Total Time spent with patient: 30 minutes  Past Psychiatric History: past history of severe major depression as part of bipolar disorder also past history of manic episodes. Depression getting worse in the past couple years. 3 of several suicide attempts  Past Medical History:  Past Medical History  Diagnosis Date  . Mood swings (HCC)   . Type II or unspecified type diabetes mellitus with unspecified complication, uncontrolled 06/02/2014  . Other and unspecified hyperlipidemia 06/02/2014  . OSA on CPAP 06/02/2014  . 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 Bipolar disorder (HCC)   . Hypertension   . Gout   . Bipolar 1 disorder (HCC)   . Hx of blood clots    History reviewed. No pertinent past surgical history. Family History:  Family History  Problem Relation Age of Onset  . Dementia Mother   . Arthritis Father   . Hypertension Father   . Heart failure Neg Hx   . Kidney failure Neg Hx   . Cancer Neg Hx   . Mental illness Sister   . Diabetes Sister    Family Psychiatric  History: positive for anxiety and depression probably bipolar disorder Social History:  History  Alcohol  Use No     History  Drug Use No    Social History   Social History  . Marital Status: Married    Spouse Name: N/A  . Number of Children: N/A  . Years of Education: N/A   Social History Main Topics  . Smoking status: Never Smoker   . Smokeless tobacco: None  . Alcohol Use: No  . Drug Use: No  . Sexual Activity: Not Currently   Other Topics Concern  . None   Social History Narrative   *The patient has been married for over 30 years and is currently on disability. He  lives with his wife in the [place] area and has one daughter.             Additional Social History:                         Sleep: Fair  Appetite:  Fair  Current Medications: Current Facility-Administered Medications  Medication Dose Route Frequency Provider Last Rate Last Dose  . acetaminophen (TYLENOL) tablet 650 mg  650 mg Oral Q6H PRN Audery Amel, MD   650 mg at 01/07/16 1418  . allopurinol (ZYLOPRIM) tablet 300 mg  300 mg Oral Daily Audery Amel, MD   300 mg at 01/08/16 0835  . alum & mag hydroxide-simeth (MAALOX/MYLANTA) 200-200-20 MG/5ML suspension 30 mL  30 mL Oral Q4H PRN Audery Amel, MD      . apixaban (ELIQUIS) tablet 5 mg  5 mg Oral BID Audery Amel, MD   5 mg at 01/08/16 2315  . aspirin EC tablet 81 mg  81 mg Oral Daily Audery Amel, MD   81 mg at 01/08/16 1610  . atorvastatin (LIPITOR) tablet 10 mg  10 mg Oral q1800 Audery Amel, MD   10 mg at 01/08/16 1720  . docusate sodium (COLACE) capsule 100 mg  100 mg Oral BID Audery Amel, MD   100 mg at 01/08/16 2315  . fentaNYL (SUBLIMAZE) injection 25 mcg  25 mcg Intravenous Q5 min PRN Berdine Addison, MD      . fentaNYL (SUBLIMAZE) injection 25 mcg  25 mcg Intravenous Q5 min PRN Gijsbertus Georgana Curio, MD      . finasteride (PROSCAR) tablet 5 mg  5 mg Oral Daily Audery Amel, MD   5 mg at 01/08/16 0836  . furosemide (LASIX) tablet 20 mg  20 mg Oral Daily Audery Amel, MD   20 mg at 01/08/16 0836  . insulin aspart (novoLOG) injection 0-9 Units  0-9 Units Subcutaneous TID WC Darliss Ridgel, MD   1 Units at 01/08/16 1719  . lisinopril (PRINIVIL,ZESTRIL) tablet 40 mg  40 mg Oral Daily Shari Prows, MD   40 mg at 01/08/16 0836  . lithium carbonate (LITHOBID) CR tablet 300 mg  300 mg Oral QHS Audery Amel, MD   300 mg at 01/08/16 2315  . magnesium hydroxide (MILK OF MAGNESIA) suspension 30 mL  30 mL Oral Daily PRN Audery Amel, MD   30 mL at 01/08/16 2315  . metFORMIN (GLUCOPHAGE) tablet 500  mg  500 mg Oral BID WC Audery Amel, MD   500 mg at 01/08/16 1720  . mirtazapine (REMERON) tablet 45 mg  45 mg Oral QHS Audery Amel, MD   45 mg at 01/08/16 2315  . ondansetron (ZOFRAN) injection 4 mg  4 mg Intravenous Once PRN Berdine Addison,  MD      . ondansetron (ZOFRAN) injection 4 mg  4 mg Intravenous Once PRN Gijsbertus F Darleene Cleaver, MD      . polyethylene glycol (MIRALAX / GLYCOLAX) packet 17 g  17 g Oral Daily Audery Amel, MD   17 g at 01/08/16 0834  . QUEtiapine (SEROQUEL) tablet 400 mg  400 mg Oral QHS Audery Amel, MD   400 mg at 01/08/16 2315  . tamsulosin (FLOMAX) capsule 0.4 mg  0.4 mg Oral QPC supper Audery Amel, MD   0.4 mg at 01/08/16 1720    Lab Results:  Results for orders placed or performed during the hospital encounter of 12/26/15 (from the past 48 hour(s))  Glucose, capillary     Status: Abnormal   Collection Time: 01/07/16  6:42 AM  Result Value Ref Range   Glucose-Capillary 108 (H) 65 - 99 mg/dL  Glucose, capillary     Status: Abnormal   Collection Time: 01/07/16 11:06 AM  Result Value Ref Range   Glucose-Capillary 121 (H) 65 - 99 mg/dL  Glucose, capillary     Status: Abnormal   Collection Time: 01/07/16  5:44 PM  Result Value Ref Range   Glucose-Capillary 109 (H) 65 - 99 mg/dL  Glucose, capillary     Status: Abnormal   Collection Time: 01/07/16  9:27 PM  Result Value Ref Range   Glucose-Capillary 114 (H) 65 - 99 mg/dL   Comment 1 Notify RN   Glucose, capillary     Status: None   Collection Time: 01/08/16  6:51 AM  Result Value Ref Range   Glucose-Capillary 97 65 - 99 mg/dL    Physical Findings: AIMS: Facial and Oral Movements Muscles of Facial Expression: None, normal Lips and Perioral Area: None, normal Jaw: None, normal Tongue: None, normal,Extremity Movements Upper (arms, wrists, hands, fingers): None, normal Lower (legs, knees, ankles, toes): None, normal, Trunk Movements Neck, shoulders, hips: None, normal, Overall  Severity Severity of abnormal movements (highest score from questions above): None, normal Incapacitation due to abnormal movements: None, normal Patient's awareness of abnormal movements (rate only patient's report): No Awareness, Dental Status Current problems with teeth and/or dentures?: No Does patient usually wear dentures?: No  CIWA:    COWS:     Musculoskeletal: Strength & Muscle Tone: within normal limits Gait & Station: unsteady Patient leans: N/A  Psychiatric Specialty Exam: Review of Systems  Constitutional: Positive for malaise/fatigue.  HENT: Negative.   Eyes: Negative.   Respiratory: Negative.   Cardiovascular: Negative.   Gastrointestinal: Negative.   Musculoskeletal: Negative.   Skin: Negative.   Neurological: Positive for weakness.  Psychiatric/Behavioral: Positive for depression. Negative for suicidal ideas.    Blood pressure 119/82, pulse 102, temperature 98.5 F (36.9 C), temperature source Oral, resp. rate 18, height 6' (1.829 m), weight 104.327 kg (230 lb), SpO2 90 %.Body mass index is 31.19 kg/(m^2).  General Appearance: Disheveled  Eye Solicitor::  Fair  Speech:  Slow  Volume:  Decreased  Mood:  Depressed  Affect:  Constricted  Thought Process:  Loose  Orientation:  Full (Time, Place, and Person)  Thought Content:  Negative  Suicidal Thoughts:  Yes.  without intent/plan  Homicidal Thoughts:  No  Memory:  Immediate;   Fair Recent;   Fair Remote;   Fair  Judgement:  Fair  Insight:  Fair  Psychomotor Activity:  Decreased  Concentration:  Poor  Recall:  [employer] of Knowledge:Fair  Language: Fair  Akathisia:  No  Handed:  Right  AIMS (if indicated):     Assets:  Communication Skills Desire for Improvement Financial Resources/Insurance Housing Social Support  ADL's:  Impaired  Cognition: Impaired,  Mild  Sleep:  Number of Hours: 7.25   Treatment Plan Summary: Medication management and Plan see the rest of the note. Continue ECT for  major depression. Also medication management with today adding a small dose of lithium which had been previously helpful.  [name], MD 01/09/2016, 12:35 AM [facility] MD Progress Note  01/09/2016 12:35 AM Christian [name]  MRN:  [id number]    Subjective:   The patient remains sad and isolative to his room. Affect was flat and he was not interacting much with staff or peers. He does come out for meals but has not been attending groups. He denies any current active or passive suicidal thoughts or psychotic symptoms. Per nursing, he did sleep fairly well last night but is also frequently napping during the daytime. He denies any new somatic complaints. He continues to use a walker in order to help with ambulation. His wife did come to visit him yesterday and will come again on Monday he says. He was unable to verbalize any specific triggering factors to depression. Appetite is fairly good and he does come out regularly for meals on the unit.Marland Kitchen He does complain of chronic pain bilaterally in his shoulders due to rotator cuff tears.  He is agreeable to continuing with ECT and is hopeful it will improve his mood.  Past Psychiatric History: Patient has a long history of bipolar disorder with both depressed and manic phases in the past a history of suicide attempts and now a history of aggression towards his wife. Has responded well to ECT but is very fragile and his illness with a tendency to relapse quickly.   Social history: The patient was born and raised in [place] by both his biological parents. 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 He has been married for 35 years and has one daughter. He is currently in disability.  Substance abuse history: The patient denies any history of any heavy alcohol use or illicit drug use   Legal history: The patient denies any prior arrest or incarcerations.   Principal Problem: Bipolar disorder, current episode depressed, severe, with  psychotic features (HCC) Diagnosis:   Patient Active Problem List   Diagnosis Date Noted  . Bipolar I disorder, most recent episode depressed, severe w psychosis (HCC) [F31.5] 12/22/2015  . Bipolar affective disorder, depressed, severe, with psychotic behavior (HCC) [F31.5]   . Bipolar I disorder, current or most recent episode depressed, with psychotic features (HCC) [F31.5]   . Bipolar I disorder, most recent episode depressed (HCC) [F31.30]   . Severe bipolar I disorder with depression (HCC) [F31.4]   . Bipolar I disorder, most recent episode depressed, severe with psychotic features (HCC) [F31.5]   . Overdose [T50.901A] 09/11/2015  . Acute respiratory failure with hypoxia (HCC) [J96.01] 09/05/2015  . Elevated troponin [R79.89] 09/05/2015  . Somnolence [R40.0] 09/05/2015  . Bipolar depression (HCC) [F31.30] 09/05/2015  . Acute bilateral deep vein thrombosis (DVT) of femoral veins (HCC) [I82.413] 09/05/2015  . Bilateral pulmonary embolism (HCC) [I26.99] 09/02/2015  . Labile hypertension [I10]   . Pulmonary embolism with acute cor pulmonale (HCC) [I26.09]   . Dyspnea [R06.00]   . Orthostatic hypotension [I95.1]   . Bipolar disorder, in partial remission, most recent episode depressed (HCC) [F31.75]   . Syncope, near [R55] 08/31/2015  .  Affective psychosis, bipolar (HCC) [F31.9]   . Bipolar disorder, now depressed (HCC) [F31.30] 08/28/2015  . Bipolar I disorder, severe, current or most recent episode depressed, with psychotic features (HCC) [F31.5]   . Pressure ulcer [L89.90] 07/30/2015  . Protein-calorie malnutrition, severe (HCC) [E43] 07/30/2015  . Dehydration [E86.0] 07/29/2015  . Bipolar disorder, current episode depressed, severe, with psychotic features (HCC) [F31.5] 07/09/2015  . Diabetes (HCC) [E11.9] 07/09/2015  . UTI (urinary tract infection) [N39.0] 06/19/2014  . Positive blood culture [R78.81] 06/19/2014  . Bipolar affective disorder, current episode manic with psychotic  symptoms (HCC) [F31.2] 06/17/2014  . Type II or unspecified type diabetes mellitus with unspecified complication, uncontrolled [E11.8, E11.65] 06/02/2014  . Other and unspecified hyperlipidemia [E78.5] 06/02/2014  . OSA on CPAP [G47.33] 06/02/2014  . 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 Gout [M10.9] 06/02/2014  . Hypertension [I10]    Total Time spent with patient: 30 minutes   Past Medical History:  Past Medical History  Diagnosis Date  . Mood swings (HCC)   . Type II or unspecified type diabetes mellitus with unspecified complication, uncontrolled 06/02/2014  . Other and unspecified hyperlipidemia 06/02/2014  . OSA on CPAP 06/02/2014  . 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 Bipolar disorder (HCC)   . Hypertension   . Gout   . Bipolar 1 disorder (HCC)   . Hx of blood clots    History reviewed. No pertinent past surgical history. Family History:  Family History  Problem Relation Age of Onset  . Dementia Mother   . Arthritis Father   . Hypertension Father   . Heart failure Neg Hx   . Kidney failure Neg Hx   . Cancer Neg Hx   . Mental illness Sister   . Diabetes Sister    Family Psychiatric  History: Positive family history for anxiety and bipolar disorder Social History:  History  Alcohol Use No     History  Drug Use No    Social History   Social History  . Marital Status: Married    Spouse Name: N/A  . Number of Children: N/A  . Years of Education: N/A   Social History Main Topics  . Smoking status: Never Smoker   . Smokeless tobacco: None  . Alcohol Use: No  . Drug Use: No  . Sexual Activity: Not Currently   Other Topics Concern  . None   Social History Narrative   *The patient has been married for over 30 years and is currently on disability. He lives with his wife in the [place] area and has one daughter.             Sleep: Fair  Appetite:  Fair  Current  Medications: Current Facility-Administered Medications  Medication Dose Route Frequency Provider Last Rate Last Dose  . acetaminophen (TYLENOL) tablet 650 mg  650 mg Oral Q6H PRN Audery Amel, MD   650 mg at 01/07/16 1418  . allopurinol (ZYLOPRIM) tablet 300 mg  300 mg Oral Daily Audery Amel, MD   300 mg at 01/08/16 0835  . alum & mag hydroxide-simeth (MAALOX/MYLANTA) 200-200-20 MG/5ML suspension 30 mL  30 mL Oral Q4H PRN Audery Amel, MD      . apixaban (ELIQUIS) tablet 5 mg  5 mg Oral BID Audery Amel, MD   5 mg at 01/08/16 2315  . aspirin EC tablet 81 mg  81 mg Oral Daily John T  Clapacs, MD   81 mg at 01/08/16 0838  . atorvastatin (LIPITOR) tablet 10 mg  10 mg Oral q1800 Audery Amel, MD   10 mg at 01/08/16 1720  . docusate sodium (COLACE) capsule 100 mg  100 mg Oral BID Audery Amel, MD   100 mg at 01/08/16 2315  . fentaNYL (SUBLIMAZE) injection 25 mcg  25 mcg Intravenous Q5 min PRN Berdine Addison, MD      . fentaNYL (SUBLIMAZE) injection 25 mcg  25 mcg Intravenous Q5 min PRN Gijsbertus Georgana Curio, MD      . finasteride (PROSCAR) tablet 5 mg  5 mg Oral Daily Audery Amel, MD   5 mg at 01/08/16 0836  . furosemide (LASIX) tablet 20 mg  20 mg Oral Daily Audery Amel, MD   20 mg at 01/08/16 0836  . insulin aspart (novoLOG) injection 0-9 Units  0-9 Units Subcutaneous TID WC Darliss Ridgel, MD   1 Units at 01/08/16 1719  . lisinopril (PRINIVIL,ZESTRIL) tablet 40 mg  40 mg Oral Daily Shari Prows, MD   40 mg at 01/08/16 0836  . lithium carbonate (LITHOBID) CR tablet 300 mg  300 mg Oral QHS Audery Amel, MD   300 mg at 01/08/16 2315  . magnesium hydroxide (MILK OF MAGNESIA) suspension 30 mL  30 mL Oral Daily PRN Audery Amel, MD   30 mL at 01/08/16 2315  . metFORMIN (GLUCOPHAGE) tablet 500 mg  500 mg Oral BID WC Audery Amel, MD   500 mg at 01/08/16 1720  . mirtazapine (REMERON) tablet 45 mg  45 mg Oral QHS Audery Amel, MD   45 mg at 01/08/16 2315  . ondansetron  (ZOFRAN) injection 4 mg  4 mg Intravenous Once PRN Berdine Addison, MD      . ondansetron [facility]) injection 4 mg  4 mg Intravenous Once PRN Gijsbertus F Darleene Cleaver, MD      . polyethylene glycol (MIRALAX / GLYCOLAX) packet 17 g  17 g Oral Daily Audery Amel, MD   17 g at 01/08/16 0834  . QUEtiapine (SEROQUEL) tablet 400 mg  400 mg Oral QHS Audery Amel, MD   400 mg at 01/08/16 2315  . tamsulosin (FLOMAX) capsule 0.4 mg  0.4 mg Oral QPC supper Audery Amel, MD   0.4 mg at 01/08/16 1720    Lab Results:  Results for orders placed or performed during the hospital encounter of 12/26/15 (from the past 48 hour(s))  Glucose, capillary     Status: Abnormal   Collection Time: 01/07/16  6:42 AM  Result Value Ref Range   Glucose-Capillary 108 (H) 65 - 99 mg/dL  Glucose, capillary     Status: Abnormal   Collection Time: 01/07/16 11:06 AM  Result Value Ref Range   Glucose-Capillary 121 (H) 65 - 99 mg/dL  Glucose, capillary     Status: Abnormal   Collection Time: 01/07/16  5:44 PM  Result Value Ref Range   Glucose-Capillary 109 (H) 65 - 99 mg/dL  Glucose, capillary     Status: Abnormal   Collection Time: 01/07/16  9:27 PM  Result Value Ref Range   Glucose-Capillary 114 (H) 65 - 99 mg/dL   Comment 1 Notify RN   Glucose, capillary     Status: None   Collection Time: 01/08/16  6:51 AM  Result Value Ref Range   Glucose-Capillary 97 65 - 99 mg/dL    Physical Findings: AIMS: Facial and  Oral Movements Muscles of Facial Expression: None, normal Lips and Perioral Area: None, normal Jaw: None, normal Tongue: None, normal,Extremity Movements Upper (arms, wrists, hands, fingers): None, normal Lower (legs, knees, ankles, toes): None, normal, Trunk Movements Neck, shoulders, hips: None, normal, Overall Severity Severity of abnormal movements (highest score from questions above): None, normal Incapacitation due to abnormal movements: None, normal Patient's awareness of abnormal movements (rate  only patient's report): No Awareness, Dental Status Current problems with teeth and/or dentures?: No Does patient usually wear dentures?: No      Musculoskeletal: Strength & Muscle Tone: within normal limits Gait & Station: unsteady Patient leans: N/A  Psychiatric Specialty Exam: Review of Systems  Constitutional: Negative for fever, chills, weight loss and malaise/fatigue.  HENT: Negative.  Negative for ear discharge, ear pain, hearing loss, nosebleeds and tinnitus.   Eyes: Negative.  Negative for blurred vision, double vision, photophobia, pain and discharge.  Respiratory: Negative.  Negative for cough, hemoptysis, sputum production and shortness of breath.   Cardiovascular: Negative.  Negative for chest pain, palpitations, orthopnea, claudication and leg swelling.  Gastrointestinal: Negative.  Negative for nausea, vomiting, abdominal pain, diarrhea, constipation and blood in stool.  Genitourinary: Negative.  Negative for dysuria, urgency and frequency.  Musculoskeletal: Negative.  Negative for back pain, falls and neck pain.       He has bilateral pain in his shoulders.  Skin: Negative.  Negative for itching and rash.  Neurological: Positive for weakness. Negative for dizziness, tingling, tremors, sensory change, speech change, focal weakness, seizures, loss of consciousness and headaches.       The patient has some weakness and unstable gait and is ambulating with a walker  Endo/Heme/Allergies: Negative for environmental allergies. Does not bruise/bleed easily.  Psychiatric/Behavioral: Positive for depression. Negative for suicidal ideas, hallucinations, memory loss and substance abuse. The patient has insomnia. The patient is not nervous/anxious.     Blood pressure 119/82, pulse 102, temperature 98.5 F (36.9 C), temperature source Oral, resp. rate 18, height 6' (1.829 m), weight 104.327 kg (230 lb), SpO2 90 %.Body mass index is 31.19 kg/(m^2).  General Appearance: Disheveled  Eye  Solicitor::  Fair  Speech:  Slow  Volume:  Decreased  Mood:  Dysphoric  Affect:  Flat  Thought Process:  Goal Directed  Orientation:  Full (Time, Place, and Person)  Thought Content:  Rumination  Suicidal Thoughts:  No  Homicidal Thoughts:  No  Memory:  Immediate;   Fair Recent;   Fair Remote;   Fair  Judgement:  Impaired  Insight:  Fair  Psychomotor Activity:  Decreased  Concentration:  Fair  Recall:  [employer] of Knowledge:Fair  Language: Fair  Akathisia:  No  Handed:  Right  AIMS (if indicated):     Assets:  Desire for Improvement Financial Resources/Insurance Housing Resilience Social Support  ADL's:  Impaired  Cognition: Impaired,  Mild  Sleep:  Number of Hours: 7.25    Mr. Cinco is a 65 year old married African-American male with a history of bipolar disorder, most recent episode depressed. He is currently undergoing ECT treatment with Dr. Toni Amend.   Bipolar disorder, type I, most recent episode depressed with psychotic behavior: The patient will continue with ECT treatment with Dr. Toni Amend. He has his next ECT treatment on Monday. Unfortunately, he remains depressed but no current active suicidal thoughts. The patient just recently had an increase in Seroquel 400 mg by mouth nightly. He is also on Remeron 45 mg by mouth nightly for depression.Consider Rexulti if mood does  not improve. The patient is currently being treated for both hyperlipidemia and diabetes I added lithium tonight as his renal function is slightly improved. Only 300 mg at night. Tolerated adding the lithium. We may consider going up on that. No ECT today. Still very withdrawn. I spoke with his wife today who suggests we consider referral to [facility] . If he is not much better soon that is a likely option. Hypertension: The patient will be maintained on Lasix 20 mg by mouth daily, lisinopril 40 mg by mouth daily and Lipitor 10 mg by mouth daily. He is also on aspirin 81 mg by mouth daily Vital signs are  stable.  Diabetes: We'll check hemoglobin A1c. The patient is on metformin 500 mg by mouth twice a day. Will start checking blood sugars and SSI placed. He will be maintained on a carbohydrate limited diet  History of DVT: We'll continue on Eliquis was 5 mg by mouth daily  Hyperlipidemia: We'll continue on Lipitor 10 mg by mouth daily  Gout: The patient is currently on allopurinol 300 mg by mouth daily  Disposition:should actually does not have a stable place at this point because of his recent behavior and the concerns his wife has. This is a major problem for disposition and when I proposed to the patient that it may be keeping him depressed he agreed with me. 01/09/2016, 12:35 AM

## 2016-01-09 NOTE — Anesthesia Preprocedure Evaluation (Signed)
Anesthesia Evaluation  Patient identified by MRN, date of birth, ID band Patient awake    Reviewed: Allergy & Precautions, H&P , NPO status , Patient's Chart, lab work & pertinent test results, reviewed documented beta blocker date and time   Airway Mallampati: III   Neck ROM: full    Dental  (+) Poor Dentition, Teeth Intact   Pulmonary neg pulmonary ROS, neg shortness of breath, sleep apnea ,    Pulmonary exam normal        Cardiovascular hypertension, + Peripheral Vascular Disease  negative cardio ROS Normal cardiovascular exam     Neuro/Psych PSYCHIATRIC DISORDERS negative neurological ROS  negative psych ROS   GI/Hepatic negative GI ROS, Neg liver ROS,   Endo/Other  negative endocrine ROSdiabetes  Renal/GU negative Renal ROS  negative genitourinary   Musculoskeletal   Abdominal   Peds  Hematology negative hematology ROS (+)   Anesthesia Other Findings Past Medical History:   Mood swings (HCC)                                            Type II or unspecified type diabetes mellitus * 06/02/2014     Other and unspecified hyperlipidemia            06/02/2014     OSA on CPAP                                     06/02/2014     Bilateral lower extremity edema: chronic with * 06/02/2014     Bipolar disorder (HCC)                                       Hypertension                                                 Gout                                                         Bipolar 1 disorder (HCC)                                     Hx of blood clots                                          History reviewed. No pertinent surgical history. BMI    Body Mass Index   30.78 kg/m 2     Reproductive/Obstetrics                             Anesthesia Physical Anesthesia Plan  ASA: III  Anesthesia Plan: General   Post-op Pain Management:  Induction:   Airway Management Planned:   Additional  Equipment:   Intra-op Plan:   Post-operative Plan:   Informed Consent: I have reviewed the patients History and Physical, chart, labs and discussed the procedure including the risks, benefits and alternatives for the proposed anesthesia with the patient or authorized representative who has indicated his/her understanding and acceptance.   Dental Advisory Given  Plan Discussed with: CRNA  Anesthesia Plan Comments:         Anesthesia Quick Evaluation

## 2016-01-09 NOTE — Progress Notes (Signed)
Spoke with pt this am.  Pt is not going to group meetings or doing any activities or watching TV  down in behavior med.  States he is not walking around much or has not taken a bath since he has been down there.  States he is going to dining area to eat his meals but states he is not eating much.  Spoke with wife today about above statements.

## 2016-01-09 NOTE — Tx Team (Signed)
Interdisciplinary Treatment Plan Update (Adult)  Date:  01/09/2016 Time Reviewed:  3:05 PM  Progress in Treatment: Attending groups: No. Participating in groups:  No. Taking medication as prescribed:  Yes. Tolerating medication:  Yes. Family/Significant othe contact made:  Yes, individual(s) contacted:  wife Patient understands diagnosis:  Yes. Discussing patient identified problems/goals with staff:  Yes. Medical problems stabilized or resolved:  Yes. Denies suicidal/homicidal ideation: Yes. Issues/concerns per patient self-inventory:  Yes. Other:  New problem(s) identified: No, Describe:  NA  Discharge Plan or Barriers:Pt unable to return home, lacks funding for group home or ALF placement. Wife is unwilling to have him come home as she feels unsafe. There are no family members at this time that he will be able to stay with. Mrs. Gossman's plan is to help him with a hotel/boarding house until other plans can be made if nothing can be done from the hospital.  Reason for Continuation of Hospitalization: Depression Medication stabilization Suicidal ideation Other; describe ECT  Comments:Patient still depressed. Denies suicidal intent or plan. Not going to groups not getting out of his room much.   Estimated length of stay: 7 days   New goal(s):  Review of initial/current patient goals per problem list:   1.  Goal(s): Patient will participate in aftercare plan * Met: No * Target date: at discharge * As evidenced by: Patient will participate within aftercare plan AEB aftercare provider and housing plan at discharge being identified. 01/07/16: Patient lethargic and very depressed and not able to participate in conversations about aftercare plan 01/09/16: patient was awake and cooperative today agreeing to PASRR consent to move forward towards assisted living placement but still no living arrangement at discharge.    2.  Goal (s): Patient will exhibit decreased depressive symptoms  and suicidal ideations. * Met: No *  Target date: at discharge * As evidenced by: Patient will utilize self rating of depression at 3 or below and demonstrate decreased signs of depression or be deemed stable for discharge by MD. 01/07/16: Patient is in ECT treatment but appears to become more depressed and isolating in his room. 01/09/16: patient remains depressed and will continue ECT through the beginning of next week and will look at referring patient to [facility]   3.  Goal(s): Patient will demonstrate decreased signs and symptoms of anxiety. * Met: No * Target date: at discharge * As evidenced by: Patient will utilize self rating of anxiety at 3 or below and demonstrated decreased signs of anxiety, or be deemed stable for discharge by MD 01/07/16: Patient is taking medications but remains in his room throughout the day 01/09/16: Patient will continue taking medications but he remains in his room and will likely be referred to [facility] next week after completing ECT  Attendees: Physician:  Alethia Berthold, MD 2/10/20173:05 PM  Nursing:   Tyler Pita, RN  2/10/20173:05 PM  Other:  Keene Breath, Latanya Presser 2/10/20173:05 PM  Other:   2/10/20173:05 PM  Other:   2/10/20173:05 PM  Other:  2/10/20173:05 PM  Other:  2/10/20173:05 PM  Other:  2/10/20173:05 PM  Other:  2/10/20173:05 PM  Other:  2/10/20173:05 PM  Other:  2/10/20173:05 PM  Other:   2/10/20173:05 PM   Scribe for Treatment Team:   Keene Breath, MSW, LCSWA [phone]  01/09/2016, 3:05 PM

## 2016-01-09 NOTE — Procedures (Signed)
ECT SERVICES Physician's Interval Evaluation & Treatment Note  Patient Identification: RAMIZ SPANNAGEL MRN:  [id number] Date of Evaluation:  01/09/2016 TX #: [id number]  MADRS: 30  MMSE: 26  P.E. Findings:  Vital signs stable no other change to physical exam  Psychiatric Interval Note:  Very withdrawn Little activity little speech  Subjective:  Patient is a 65 y.o. male seen for evaluation for Electroconvulsive Therapy. No specific complaint  Treatment Summary:   []   Right Unilateral             [x]  Bilateral   % Energy : 1.0 ms 100%   Impedance: 910 ohms  Seizure Energy Index: 2493 V squared  Postictal Suppression Index: 57%  Seizure Concordance Index: 99%  Medications  Pre Shock: Labetalol 20 mg, ketamine 120 mg, succinylcholine 150 mg  Post Shock: Labetalol 20 mg  Seizure Duration: 37 seconds by EMG, 68 seconds by EEG   Comments: Follow-up Monday we are continuing to hope that with enough treatments we will see some improvement at this point.   Lungs:  [x]   Clear to auscultation               []  Other:   Heart:    [x]   Regular rhythm             []  irregular rhythm    [x]   Previous H&P reviewed, patient examined and there are NO CHANGES                 []   Previous H&P reviewed, patient examined and there are changes noted.   [name], MD 2/10/201711:26 AM

## 2016-01-09 NOTE — Progress Notes (Signed)
Patient tolerated ECT today.Stated that he feels the same about his depression.Denies suicidal ideations.Stayed in bed after the ECT.Compliant with medications.Patient vomited x1.Zofran helped with nausea.Appetite good.Patient stated that he can not go back home that makes him more depressed.Encouraged patient for positive coping skills.

## 2016-01-09 NOTE — Plan of Care (Signed)
Problem: Diagnosis: Increased Risk For Suicide Attempt Goal: LTG-Patient Will Report Improved Mood and Deny Suicidal LTG (by discharge) Patient will report improved mood and deny suicidal ideation.  Outcome: Progressing Denies suicidal ideations.     

## 2016-01-09 NOTE — [facility] Group Notes (Signed)
[facility] Group Notes:  (Nursing/MHT/Case Management/Adjunct)  Date:  01/09/2016  Time:  12:14 PM  Type of Therapy:  Group Therapy  Participation Level:  Did Not Attend  Christian [name] 01/09/2016, 12:14 PM

## 2016-01-09 NOTE — [facility] Group Notes (Signed)
[facility] LCSW Aftercare Discharge Planning Group Note   01/09/2016 3:47 PM  Participation Quality:  Patient was in ECT and did not attend group.   Lulu Riding, MSW, LCSWA

## 2016-01-09 NOTE — H&P (Signed)
Christian [name] is an 65 y.o. male.   Chief Complaint: Patient is vague about complaint. He will say that he feels better but is not able to really specify how. Still depressed. Still very little activity HPI: Bipolar disorder depressed severely down not active recent dangerous behavior with psychotic symptoms. Little improvement so far  Past Medical History  Diagnosis Date  . Mood swings ([place])   . Type II or unspecified type diabetes mellitus with unspecified complication, uncontrolled 06/02/2014  . Other and unspecified hyperlipidemia 06/02/2014  . OSA on CPAP 06/02/2014  . 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 Bipolar disorder ([place])   . Hypertension   . Gout   . Bipolar 1 disorder ([place])   . Hx of blood clots     History reviewed. No pertinent past surgical history.  Family History  Problem Relation Age of Onset  . Dementia Mother   . Arthritis Father   . Hypertension Father   . Heart failure Neg Hx   . Kidney failure Neg Hx   . Cancer Neg Hx   . Mental illness Sister   . Diabetes Sister    Social History:  reports that he has never smoked. He does not have any smokeless tobacco history on file. He reports that he does not drink alcohol or use illicit drugs.  Allergies: No Known Allergies  Medications Prior to Admission  Medication Sig Dispense Refill  . allopurinol (ZYLOPRIM) 300 MG tablet Take 1 tablet (300 mg total) by mouth daily. 30 tablet 0  . apixaban (ELIQUIS) 5 MG TABS tablet Take 1 tablet (5 mg total) by mouth 2 (two) times daily. 60 tablet 0  . atorvastatin (LIPITOR) 10 MG tablet Take 1 tablet (10 mg total) by mouth daily at 6 PM. 30 tablet 0  . clonazePAM (KLONOPIN) 1 MG tablet Take 1 tablet (1 mg total) by mouth at bedtime. 30 tablet 0  . finasteride (PROSCAR) 5 MG tablet Take 1 tablet (5 mg total) by mouth daily. 30 tablet 0  . furosemide (LASIX) 20 MG tablet Take 20 mg by mouth daily.     Marland Kitchen lisinopril (PRINIVIL,ZESTRIL)  40 MG tablet Take 40 mg by mouth daily.    . metFORMIN (GLUCOPHAGE-XR) 500 MG 24 hr tablet Take 500 mg by mouth 2 (two) times daily.    . mirtazapine (REMERON) 45 MG tablet Take 1 tablet (45 mg total) by mouth at bedtime. 30 tablet 0  . OLANZapine (ZYPREXA) 15 MG tablet Take 2 tablets (30 mg total) by mouth at bedtime. 60 tablet 0  . tamsulosin (FLOMAX) 0.4 MG CAPS capsule Take 1 capsule (0.4 mg total) by mouth daily after supper. 30 capsule 0  . aspirin EC 81 MG EC tablet Take 1 tablet (81 mg total) by mouth daily. (Patient not taking: Reported on 12/29/2015) 30 tablet 0  . docusate sodium (COLACE) 100 MG capsule Take 2 capsules (200 mg total) by mouth 2 (two) times daily. (Patient not taking: Reported on 12/29/2015) 60 capsule 0  . polyethylene glycol (MIRALAX / GLYCOLAX) packet Take 17 g by mouth daily. (Patient not taking: Reported on 12/29/2015) 30 each 0    Results for orders placed or performed during the hospital encounter of 12/22/15 (from the past 48 hour(s))  Creatinine, serum     Status: Abnormal   Collection Time: 01/09/16  6:30 AM  Result Value Ref Range   Creatinine, Ser 1.34 (H) 0.61 - 1.24 mg/dL   GFR calc  non Af Amer 55 (L) >60 mL/min   GFR calc Af Amer >60 >60 mL/min    Comment: (NOTE) The eGFR has been calculated using the CKD EPI equation. This calculation has not been validated in all clinical situations. eGFR's persistently <60 mL/min signify possible Chronic Kidney Disease.   CBC     Status: None   Collection Time: 01/09/16  6:30 AM  Result Value Ref Range   WBC 4.0 3.8 - 10.6 K/uL   RBC 4.88 4.40 - 5.90 MIL/uL   Hemoglobin 14.2 13.0 - 18.0 g/dL   HCT 42.8 40.0 - 52.0 %   MCV 87.7 80.0 - 100.0 fL   MCH 29.1 26.0 - 34.0 pg   MCHC 33.2 32.0 - 36.0 g/dL   RDW 13.0 11.5 - 14.5 %   Platelets 158 150 - 440 K/uL   No results found.  Review of Systems  Constitutional: Negative.   HENT: Negative.   Eyes: Negative.   Respiratory: Negative.   Cardiovascular:  Negative.   Gastrointestinal: Negative.   Musculoskeletal: Negative.   Skin: Negative.   Neurological: Negative.   Psychiatric/Behavioral: Positive for depression and memory loss. Negative for suicidal ideas, hallucinations and substance abuse. The patient is nervous/anxious and has insomnia.     Blood pressure 120/82, pulse 96, temperature 96.4 F (35.8 C), temperature source Oral, resp. rate 18, height 6' (1.829 m), weight 102.967 kg (227 lb), SpO2 100 %. Physical Exam  Nursing note and vitals reviewed. Constitutional: He appears well-developed and well-nourished.  HENT:  Head: Normocephalic and atraumatic.  Eyes: Conjunctivae are normal. Pupils are equal, round, and reactive to light.  Neck: Normal range of motion.  Cardiovascular: Normal rate, regular rhythm and normal heart sounds.   Respiratory: Effort normal and breath sounds normal. No respiratory distress.  GI: Soft.  Musculoskeletal: Normal range of motion.  Neurological: He is alert.  Skin: Skin is warm and dry.  Psychiatric: His affect is blunt. His speech is delayed. He is slowed and withdrawn. He expresses inappropriate judgment. He exhibits a depressed mood. He is noncommunicative. He exhibits abnormal recent memory.     Assessment/Plan We are continuing bilateral ECT in hopes of seeing some improvement. He is starting to get to the point where we may have maximized treatment. Continue monitoring on the ward but we are going to start looking into placement as well.  Alethia Berthold, MD 01/09/2016, 11:23 AM

## 2016-01-10 NOTE — Progress Notes (Signed)
D: Pt isolates in his room this evening. Pt comes out of room for snacks and evening medications. Ambulates around unit with a walker. Pt continues to verbalize depression which he rates a 5 out of 10. Denies SI/HI/AVH at this time. Denies pain. Pt reports that he threw up after ECT today. Denies nausea/vomiting at this time. A: Emotional support and encouragement provided. Medications administered with education. Pt educated on fall prevention. q15 minute safety checks maintained. R: Pt verbalizes understanding. Pt remains free from harm.

## 2016-01-10 NOTE — [facility] Group Notes (Signed)
[facility] Group Notes:  (Nursing/MHT/Case Management/Adjunct)  Date:  01/10/2016  Time:  9:52 AM  Type of Therapy:  Psychoeducational Skills  Participation Level:  Did Not Attend  Participation Quality:  n/a  Affect:  n/a  Cognitive:  n/a  Insight:  None  Engagement in Group:  n/a  Modes of Intervention:  n/a  Summary of Progress/Problems:  Christian [name] 01/10/2016, 9:52 AM

## 2016-01-10 NOTE — [facility] Group Notes (Signed)
  [facility] LCSW Group Therapy  01/10/2016 12:11 PM  Type of Therapy:  Group Therapy  Participation Level:  Did Not Attend  Modes of Intervention:  Discussion, Education, Socialization and Support  Summary of Progress/Problems: Balance in life: Patients will discuss the concept of balance and how it looks and feels to be unbalanced. Pt will identify areas in their life that is unbalanced and ways to become more balanced.    Undine Nealis L Channah Godeaux MSW, LCSWA  01/10/2016, 12:11 PM  

## 2016-01-10 NOTE — Progress Notes (Signed)
[facility] MD Progress Note  01/10/2016 3:07 PM Christian [name]  MRN:  [id number] Subjective: Pt is a 65 year old man with severe depression as part of bipolar disorder. Patient seen for follow-up. He has been getting ECT treatment which she is tolerating well. He was lying in the bed. He reported that he is not improving and speak a little to me as usual. Staff reported that he is not having any worsening of his symptoms.  He still lays in bed all the time. Today he is denying suicidal ideation. He denied having any perceptual disturbances. He is compliant with his treatment at this time. No acute issues noted.   Principal Problem: Bipolar disorder, current episode depressed, severe, with psychotic features ([place]) Diagnosis:   Patient Active Problem List   Diagnosis Date Noted  . Bipolar I disorder, most recent episode depressed, severe w psychosis ([place]) [F31.5] 12/22/2015  . Bipolar affective disorder, depressed, severe, with psychotic behavior ([place]) [F31.5]   . Bipolar I disorder, current or most recent episode depressed, with psychotic features ([place]) [F31.5]   . Bipolar I disorder, most recent episode depressed ([place]) [F31.30]   . Severe bipolar I disorder with depression ([place]) [F31.4]   . Bipolar I disorder, most recent episode depressed, severe with psychotic features ([place]) [F31.5]   . Overdose [T50.901A] 09/11/2015  . Acute respiratory failure with hypoxia ([place]) [J96.01] 09/05/2015  . Elevated troponin [R79.89] 09/05/2015  . Somnolence [R40.0] 09/05/2015  . Bipolar depression ([place]) [F31.30] 09/05/2015  . Acute bilateral deep vein thrombosis (DVT) of femoral veins ([facility]) [I82.413] 09/05/2015  . Bilateral pulmonary embolism ([place]) [I26.99] 09/02/2015  . Labile hypertension [I10]   . Pulmonary embolism with acute cor pulmonale ([place]) [I26.09]   . Dyspnea [R06.00]   . Orthostatic hypotension [I95.1]   . Bipolar disorder, in partial remission, most recent episode depressed ([place]) [F31.75]   .  Syncope, near [R55] 08/31/2015  . Affective psychosis, bipolar ([place]) [F31.9]   . Bipolar disorder, now depressed ([place]) [F31.30] 08/28/2015  . Bipolar I disorder, severe, current or most recent episode depressed, with psychotic features ([place]) [F31.5]   . Pressure ulcer [L89.90] 07/30/2015  . Protein-calorie malnutrition, severe ([place]) [E43] 07/30/2015  . Dehydration [E86.0] 07/29/2015  . Bipolar disorder, current episode depressed, severe, with psychotic features ([place]) [F31.5] 07/09/2015  . Diabetes ([place]) [E11.9] 07/09/2015  . UTI (urinary tract infection) [N39.0] 06/19/2014  . Positive blood culture [R78.81] 06/19/2014  . Bipolar affective disorder, current episode manic with psychotic symptoms ([place]) [F31.2] 06/17/2014  . Type II or unspecified type diabetes mellitus with unspecified complication, uncontrolled [E11.8, E11.65] 06/02/2014  . Other and unspecified hyperlipidemia [E78.5] 06/02/2014  . OSA on CPAP [G47.33] 06/02/2014  . 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 Gout [M10.9] 06/02/2014  . Hypertension [I10]    Total Time spent with patient: 30 minutes  Past Psychiatric History: Long history of bipolar disorder with history of suicide attempts and now a history of aggression  Past Medical History:  Past Medical History  Diagnosis Date  . Mood swings ([place])   . Type II or unspecified type diabetes mellitus with unspecified complication, uncontrolled 06/02/2014  . Other and unspecified hyperlipidemia 06/02/2014  . OSA on CPAP 06/02/2014  . 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 Bipolar disorder ([place])   . Hypertension   . Gout   . Bipolar 1 disorder ([place])   . Hx of blood clots    History reviewed. No  pertinent past surgical history. Family History:  Family History  Problem Relation Age of Onset  . Dementia Mother   . Arthritis Father   . Hypertension Father   . Heart failure Neg Hx   .  Kidney failure Neg Hx   . Cancer Neg Hx   . Mental illness Sister   . Diabetes Sister    Family Psychiatric  History: Positive for bipolar disorder and anxiety Social History:  History  Alcohol Use No     History  Drug Use No    Social History   Social History  . Marital Status: Married    Spouse Name: N/A  . Number of Children: N/A  . Years of Education: N/A   Social History Main Topics  . Smoking status: Never Smoker   . Smokeless tobacco: None  . Alcohol Use: No  . Drug Use: No  . Sexual Activity: Not Currently   Other Topics Concern  . None   Social History Narrative   *The patient has been married for over 30 years and is currently on disability. He lives with his wife in the [place] area and has one daughter.             Additional Social History:                         Sleep: Poor  Appetite:  Fair  Current Medications: Current Facility-Administered Medications  Medication Dose Route Frequency Provider Last Rate Last Dose  . acetaminophen (TYLENOL) tablet 650 mg  650 mg Oral Q6H PRN Gonzella Lex, MD   650 mg at 01/07/16 1418  . allopurinol (ZYLOPRIM) tablet 300 mg  300 mg Oral Daily Gonzella Lex, MD   300 mg at 01/10/16 0902  . alum & mag hydroxide-simeth (MAALOX/MYLANTA) 200-200-20 MG/5ML suspension 30 mL  30 mL Oral Q4H PRN Gonzella Lex, MD      . apixaban (ELIQUIS) tablet 5 mg  5 mg Oral BID Gonzella Lex, MD   5 mg at 01/10/16 0903  . aspirin EC tablet 81 mg  81 mg Oral Daily Gonzella Lex, MD   81 mg at 01/10/16 4680  . atorvastatin (LIPITOR) tablet 10 mg  10 mg Oral q1800 Gonzella Lex, MD   10 mg at 01/09/16 1711  . docusate sodium (COLACE) capsule 100 mg  100 mg Oral BID Gonzella Lex, MD   100 mg at 01/10/16 0902  . fentaNYL (SUBLIMAZE) injection 25 mcg  25 mcg Intravenous Q5 min PRN Gunnar Bulla, MD      . fentaNYL (SUBLIMAZE) injection 25 mcg  25 mcg Intravenous Q5 min PRN Gijsbertus Lonia Mad, MD      . finasteride  (PROSCAR) tablet 5 mg  5 mg Oral Daily Gonzella Lex, MD   5 mg at 01/10/16 0902  . furosemide (LASIX) tablet 20 mg  20 mg Oral Daily Gonzella Lex, MD   20 mg at 01/10/16 0902  . insulin aspart (novoLOG) injection 0-9 Units  0-9 Units Subcutaneous TID WC Chauncey Mann, MD   1 Units at 01/10/16 1158  . lisinopril (PRINIVIL,ZESTRIL) tablet 40 mg  40 mg Oral Daily Clovis Fredrickson, MD   40 mg at 01/10/16 0903  . lithium carbonate (ESKALITH) CR tablet 450 mg  450 mg Oral QHS Gonzella Lex, MD   450 mg at 01/09/16 2147  . magnesium hydroxide (MILK OF MAGNESIA) suspension 30 mL  30 mL Oral Daily PRN Gonzella Lex, MD   30 mL at 01/08/16 2315  . metFORMIN (GLUCOPHAGE) tablet 500 mg  500 mg Oral BID WC Gonzella Lex, MD   500 mg at 01/10/16 0902  . metoprolol tartrate (LOPRESSOR) tablet 12.5 mg  12.5 mg Oral BID Gonzella Lex, MD   12.5 mg at 01/10/16 0903  . mirtazapine (REMERON) tablet 45 mg  45 mg Oral QHS Gonzella Lex, MD   45 mg at 01/09/16 2147  . ondansetron (ZOFRAN) injection 4 mg  4 mg Intravenous Once PRN Gunnar Bulla, MD      . ondansetron [facility]) injection 4 mg  4 mg Intravenous Once PRN Gijsbertus Lonia Mad, MD      . ondansetron (ZOFRAN-ODT) disintegrating tablet 4 mg  4 mg Oral Q8H PRN Gonzella Lex, MD   4 mg at 01/09/16 1340  . polyethylene glycol (MIRALAX / GLYCOLAX) packet 17 g  17 g Oral Daily Gonzella Lex, MD   17 g at 01/10/16 0903  . QUEtiapine (SEROQUEL) tablet 400 mg  400 mg Oral QHS Gonzella Lex, MD   400 mg at 01/09/16 2147  . tamsulosin (FLOMAX) capsule 0.4 mg  0.4 mg Oral QPC supper Gonzella Lex, MD   0.4 mg at 01/09/16 1711    Lab Results:  Results for orders placed or performed during the hospital encounter of 12/22/15 (from the past 48 hour(s))  Creatinine, serum     Status: Abnormal   Collection Time: 01/09/16  6:30 AM  Result Value Ref Range   Creatinine, Ser 1.34 (H) 0.61 - 1.24 mg/dL   GFR calc non Af Amer 55 (L) >60 mL/min   GFR calc Af  Amer >60 >60 mL/min    Comment: (NOTE) The eGFR has been calculated using the CKD EPI equation. This calculation has not been validated in all clinical situations. eGFR's persistently <60 mL/min signify possible Chronic Kidney Disease.   CBC     Status: None   Collection Time: 01/09/16  6:30 AM  Result Value Ref Range   WBC 4.0 3.8 - 10.6 K/uL   RBC 4.88 4.40 - 5.90 MIL/uL   Hemoglobin 14.2 13.0 - 18.0 g/dL   HCT 42.8 40.0 - 52.0 %   MCV 87.7 80.0 - 100.0 fL   MCH 29.1 26.0 - 34.0 pg   MCHC 33.2 32.0 - 36.0 g/dL   RDW 13.0 11.5 - 14.5 %   Platelets 158 150 - 440 K/uL    Physical Findings: AIMS: Facial and Oral Movements Muscles of Facial Expression: None, normal Lips and Perioral Area: None, normal Jaw: None, normal Tongue: None, normal,Extremity Movements Upper (arms, wrists, hands, fingers): None, normal Lower (legs, knees, ankles, toes): None, normal, Trunk Movements Neck, shoulders, hips: None, normal, Overall Severity Severity of abnormal movements (highest score from questions above): None, normal Incapacitation due to abnormal movements: None, normal Patient's awareness of abnormal movements (rate only patient's report): No Awareness, Dental Status Current problems with teeth and/or dentures?: No Does patient usually wear dentures?: No  CIWA:    COWS:     Musculoskeletal: Strength & Muscle Tone: decreased Gait & Station: shuffle Patient leans: N/A  Psychiatric Specialty Exam: Review of Systems  HENT: Negative.   Eyes: Negative.   Respiratory: Negative.   Cardiovascular: Negative.   Gastrointestinal: Negative.   Musculoskeletal: Negative.   Skin: Negative.   Neurological: Positive for weakness.  Psychiatric/Behavioral: Positive for depression and memory loss. Negative for  suicidal ideas, hallucinations and substance abuse. The patient is nervous/anxious and has insomnia.     Blood pressure 110/64, pulse 114, temperature 98.3 F (36.8 C), temperature  source Oral, resp. rate 18, height 6' (1.829 m), weight 227 lb (102.967 kg), SpO2 100 %.Body mass index is 30.78 kg/(m^2).  General Appearance: Casual  Eye Contact::  Fair  Speech:  Slow  Volume:  Decreased  Mood:  Dysphoric  Affect:  Constricted  Thought Process:  Tangential  Orientation:  Full (Time, Place, and Person)  Thought Content:  Rumination  Suicidal Thoughts:  No  Homicidal Thoughts:  No  Memory:  Immediate;   Fair Recent;   Fair Remote;   Fair  Judgement:  Poor  Insight:  Fair  Psychomotor Activity:  Psychomotor Retardation  Concentration:  Fair  Recall:  [employer] of Knowledge:Fair  Language: Fair  Akathisia:  No  Handed:  Right  AIMS (if indicated):     Assets:  Desire for Improvement Resilience Social Support  ADL's:  Impaired  Cognition: Impaired,  Mild  Sleep:  Number of Hours: 7.5   Treatment Plan Summary: Daily contact with patient to assess and evaluate symptoms and progress in treatment and Medication management   Continue with his medications. He  is also getting ECT on a regular basis. His next treatment is on Monday.  Will continue to monitor  Rainey Pines, MD 01/10/2016, 3:07 PM

## 2016-01-10 NOTE — Plan of Care (Signed)
Problem: Alteration in mood Goal: LTG-Pt's behavior demonstrates decreased signs of depression (Patient's behavior demonstrates decreased signs of depression to the point the patient is safe to return home and continue treatment in an outpatient setting)  Outcome: Not Progressing Pt rates depression as a 5 out of 10 this evening  Problem: Diagnosis: Increased Risk For Suicide Attempt Goal: STG-Patient Will Comply With Medication Regime Outcome: Progressing Pt taking medications as prescribed

## 2016-01-10 NOTE — Plan of Care (Signed)
Problem: Diagnosis: Increased Risk For Suicide Attempt Goal: LTG-Patient Will Report Improved Mood and Deny Suicidal LTG (by discharge) Patient will report improved mood and deny suicidal ideation.  Outcome: Progressing Denies suicidal ideations.     

## 2016-01-10 NOTE — Progress Notes (Signed)
Patient rated his depression 7/10.Denies suicidal ideations.Did not attend groups.Encouraged patient to get out of bed & interact with peers.He ambulates with walker.Compliant with medications.

## 2016-01-11 NOTE — Progress Notes (Signed)
D: Observed in day room and in mileu, but not socializing with peers or staff. Patient alert and oriented x4. Patient denies SI/HI/AVH. Pt affect is flat. Pt stated "I went to one group today. Pt was seen ambulating more this evening. Pt forwards little, but was more talkative with writer this evening. Pt still not going to most groups. Pt continues to endorse that his depression is not improving, rating depression 8/10 or anxiety 5/10. A: Offered active listening and support. Provided therapeutic communication. Administered scheduled medications.  R: Pt pleasant and cooperative. Pt medication compliant. Will continue Q15 min. checks. Safety maintained.

## 2016-01-11 NOTE — Progress Notes (Signed)
D: Patient denies SI/HI/AVH.   Patient affect and mood are depressed.   Patient remained in his room the majority of the day.  During meal time, he joined the milieu but did not engage in interaction with his peers.  . No distress noted. A: Support and encouragement offered. Scheduled medications given to pt. Q 15 min checks continued for patient safety. R: Patient receptive. Patient remains safe on the unit.

## 2016-01-11 NOTE — Plan of Care (Signed)
Problem: Alteration in mood Goal: LTG-Pt's behavior demonstrates decreased signs of depression (Patient's behavior demonstrates decreased signs of depression to the point the patient is safe to return home and continue treatment in an outpatient setting)  Outcome: Not Progressing Pt rated depression 8/10 and indicated that his depression was not improving.

## 2016-01-11 NOTE — Progress Notes (Signed)
[facility] MD Progress Note  01/11/2016 5:39 PM Christian [name]  MRN:  [id number] Subjective: Pt is a 64 year old man with severe depression bipolar disorder. Patient seen for follow-up. He has been getting ECT treatment which he is tolerating well. He was lying in the bed. He reported that he is improving and slept well last night. He reported that he does not have any acute symptoms. He appeared somewhat alert this morning. He currently denied having any acute symptoms. He denied having any suicidal ideations or plans.  Staff reported that he is not having any worsening of his symptoms.  He still lays in bed all the time.  He denied having any perceptual disturbances. He is compliant with his treatment at this time. No acute issues noted.   Principal Problem: Bipolar disorder, current episode depressed, severe, with psychotic features (HCC) Diagnosis:   Patient Active Problem List   Diagnosis Date Noted  . Bipolar I disorder, most recent episode depressed, severe w psychosis (HCC) [F31.5] 12/22/2015  . Bipolar affective disorder, depressed, severe, with psychotic behavior (HCC) [F31.5]   . Bipolar I disorder, current or most recent episode depressed, with psychotic features (HCC) [F31.5]   . Bipolar I disorder, most recent episode depressed (HCC) [F31.30]   . Severe bipolar I disorder with depression (HCC) [F31.4]   . Bipolar I disorder, most recent episode depressed, severe with psychotic features (HCC) [F31.5]   . Overdose [T50.901A] 09/11/2015  . Acute respiratory failure with hypoxia (HCC) [J96.01] 09/05/2015  . Elevated troponin [R79.89] 09/05/2015  . Somnolence [R40.0] 09/05/2015  . Bipolar depression (HCC) [F31.30] 09/05/2015  . Acute bilateral deep vein thrombosis (DVT) of femoral veins (HCC) [I82.413] 09/05/2015  . Bilateral pulmonary embolism (HCC) [I26.99] 09/02/2015  . Labile hypertension [I10]   . Pulmonary embolism with acute cor pulmonale (HCC) [I26.09]   . Dyspnea [R06.00]   .  Orthostatic hypotension [I95.1]   . Bipolar disorder, in partial remission, most recent episode depressed (HCC) [F31.75]   . Syncope, near [R55] 08/31/2015  . Affective psychosis, bipolar (HCC) [F31.9]   . Bipolar disorder, now depressed (HCC) [F31.30] 08/28/2015  . Bipolar I disorder, severe, current or most recent episode depressed, with psychotic features (HCC) [F31.5]   . Pressure ulcer [L89.90] 07/30/2015  . Protein-calorie malnutrition, severe (HCC) [E43] 07/30/2015  . Dehydration [E86.0] 07/29/2015  . Bipolar disorder, current episode depressed, severe, with psychotic features (HCC) [F31.5] 07/09/2015  . Diabetes (HCC) [E11.9] 07/09/2015  . UTI (urinary tract infection) [N39.0] 06/19/2014  . Positive blood culture [R78.81] 06/19/2014  . Bipolar affective disorder, current episode manic with psychotic symptoms (HCC) [F31.2] 06/17/2014  . Type II or unspecified type diabetes mellitus with unspecified complication, uncontrolled [E11.8, E11.65] 06/02/2014  . Other and unspecified hyperlipidemia [E78.5] 06/02/2014  . OSA on CPAP [G47.33] 06/02/2014  . 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 Gout [M10.9] 06/02/2014  . Hypertension [I10]    Total Time spent with patient: 30 minutes  Past Psychiatric History: Long history of bipolar disorder with history of suicide attempts and now a history of aggression  Past Medical History:  Past Medical History  Diagnosis Date  . Mood swings (HCC)   . Type II or unspecified type diabetes mellitus with unspecified complication, uncontrolled 06/02/2014  . Other and unspecified hyperlipidemia 06/02/2014  . OSA on CPAP 06/02/2014  . 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 Bipolar disorder (HCC)   . Hypertension   . Gout   .  Bipolar 1 disorder (HCC)   . Hx of blood clots    History reviewed. No pertinent past surgical history. Family History:  Family History   Problem Relation Age of Onset  . Dementia Mother   . Arthritis Father   . Hypertension Father   . Heart failure Neg Hx   . Kidney failure Neg Hx   . Cancer Neg Hx   . Mental illness Sister   . Diabetes Sister    Family Psychiatric  History: Positive for bipolar disorder and anxiety Social History:  History  Alcohol Use No     History  Drug Use No    Social History   Social History  . Marital Status: Married    Spouse Name: N/A  . Number of Children: N/A  . Years of Education: N/A   Social History Main Topics  . Smoking status: Never Smoker   . Smokeless tobacco: None  . Alcohol Use: No  . Drug Use: No  . Sexual Activity: Not Currently   Other Topics Concern  . None   Social History Narrative   *The patient has been married for over 30 years and is currently on disability. He lives with his wife in the [place] area and has one daughter.             Additional Social History:                         Sleep: Poor  Appetite:  Fair  Current Medications: Current Facility-Administered Medications  Medication Dose Route Frequency Provider Last Rate Last Dose  . acetaminophen (TYLENOL) tablet 650 mg  650 mg Oral Q6H PRN Audery Amel, MD   650 mg at 01/07/16 1418  . allopurinol (ZYLOPRIM) tablet 300 mg  300 mg Oral Daily Audery Amel, MD   300 mg at 01/11/16 0936  . alum & mag hydroxide-simeth (MAALOX/MYLANTA) 200-200-20 MG/5ML suspension 30 mL  30 mL Oral Q4H PRN Audery Amel, MD      . apixaban (ELIQUIS) tablet 5 mg  5 mg Oral BID Audery Amel, MD   5 mg at 01/11/16 0936  . aspirin EC tablet 81 mg  81 mg Oral Daily Audery Amel, MD   81 mg at 01/11/16 0936  . atorvastatin (LIPITOR) tablet 10 mg  10 mg Oral q1800 Audery Amel, MD   10 mg at 01/10/16 1647  . docusate sodium (COLACE) capsule 100 mg  100 mg Oral BID Audery Amel, MD   100 mg at 01/11/16 0936  . fentaNYL (SUBLIMAZE) injection 25 mcg  25 mcg Intravenous Q5 min PRN Berdine Addison,  MD      . fentaNYL (SUBLIMAZE) injection 25 mcg  25 mcg Intravenous Q5 min PRN Gijsbertus Georgana Curio, MD      . finasteride (PROSCAR) tablet 5 mg  5 mg Oral Daily Audery Amel, MD   5 mg at 01/11/16 0936  . furosemide (LASIX) tablet 20 mg  20 mg Oral Daily Audery Amel, MD   20 mg at 01/11/16 0936  . insulin aspart (novoLOG) injection 0-9 Units  0-9 Units Subcutaneous TID WC Darliss Ridgel, MD   1 Units at 01/10/16 1647  . lisinopril (PRINIVIL,ZESTRIL) tablet 40 mg  40 mg Oral Daily Shari Prows, MD   40 mg at 01/11/16 0936  . lithium carbonate (ESKALITH) CR tablet 450 mg  450 mg Oral QHS Audery Amel, MD  450 mg at 01/10/16 2245  . magnesium hydroxide (MILK OF MAGNESIA) suspension 30 mL  30 mL Oral Daily PRN Audery Amel, MD   30 mL at 01/08/16 2315  . metFORMIN (GLUCOPHAGE) tablet 500 mg  500 mg Oral BID WC Audery Amel, MD   500 mg at 01/11/16 0936  . metoprolol tartrate (LOPRESSOR) tablet 12.5 mg  12.5 mg Oral BID Audery Amel, MD   12.5 mg at 01/11/16 0936  . mirtazapine (REMERON) tablet 45 mg  45 mg Oral QHS Audery Amel, MD   45 mg at 01/10/16 2245  . ondansetron (ZOFRAN) injection 4 mg  4 mg Intravenous Once PRN Berdine Addison, MD      . ondansetron [facility]) injection 4 mg  4 mg Intravenous Once PRN Gijsbertus Georgana Curio, MD      . ondansetron (ZOFRAN-ODT) disintegrating tablet 4 mg  4 mg Oral Q8H PRN Audery Amel, MD   4 mg at 01/09/16 1340  . polyethylene glycol (MIRALAX / GLYCOLAX) packet 17 g  17 g Oral Daily Audery Amel, MD   17 g at 01/11/16 0936  . QUEtiapine (SEROQUEL) tablet 400 mg  400 mg Oral QHS Audery Amel, MD   400 mg at 01/10/16 2244  . tamsulosin (FLOMAX) capsule 0.4 mg  0.4 mg Oral QPC supper Audery Amel, MD   0.4 mg at 01/10/16 1654    Lab Results:  No results found for this or any previous visit (from the past 48 hour(s)).  Physical Findings: AIMS: Facial and Oral Movements Muscles of Facial Expression: None, normal Lips and  Perioral Area: None, normal Jaw: None, normal Tongue: None, normal,Extremity Movements Upper (arms, wrists, hands, fingers): None, normal Lower (legs, knees, ankles, toes): None, normal, Trunk Movements Neck, shoulders, hips: None, normal, Overall Severity Severity of abnormal movements (highest score from questions above): None, normal Incapacitation due to abnormal movements: None, normal Patient's awareness of abnormal movements (rate only patient's report): No Awareness, Dental Status Current problems with teeth and/or dentures?: No Does patient usually wear dentures?: No  CIWA:    COWS:     Musculoskeletal: Strength & Muscle Tone: decreased Gait & Station: shuffle Patient leans: N/A  Psychiatric Specialty Exam: Review of Systems  HENT: Negative.   Eyes: Negative.   Respiratory: Negative.   Cardiovascular: Negative.   Gastrointestinal: Negative.   Musculoskeletal: Negative.   Skin: Negative.   Neurological: Positive for weakness.  Psychiatric/Behavioral: Positive for depression and memory loss. Negative for suicidal ideas, hallucinations and substance abuse. The patient is nervous/anxious and has insomnia.     Blood pressure 129/86, pulse 99, temperature 98.3 F (36.8 C), temperature source Oral, resp. rate 20, height 6' (1.829 m), weight 227 lb (102.967 kg), SpO2 100 %.Body mass index is 30.78 kg/(m^2).  General Appearance: Casual  Eye Contact::  Fair  Speech:  Slow  Volume:  Decreased  Mood:  Dysphoric  Affect:  Constricted  Thought Process:  Tangential  Orientation:  Full (Time, Place, and Person)  Thought Content:  Rumination  Suicidal Thoughts:  No  Homicidal Thoughts:  No  Memory:  Immediate;   Fair Recent;   Fair Remote;   Fair  Judgement:  Poor  Insight:  Fair  Psychomotor Activity:  Psychomotor Retardation  Concentration:  Fair  Recall:  [employer] of Knowledge:Fair  Language: Fair  Akathisia:  No  Handed:  Right  AIMS (if indicated):     Assets:   Desire for Improvement  Resilience Social Support  ADL's:  Impaired  Cognition: Impaired,  Mild  Sleep:  Number of Hours: 7   Treatment Plan Summary: Daily contact with patient to assess and evaluate symptoms and progress in treatment and Medication management   Continue with his medications. He  is also getting ECT on a regular basis. His next treatment is on Monday.  Will continue to monitor  Brandy Hale, MD 01/11/2016, 5:39 PM

## 2016-01-11 NOTE — [facility] Group Notes (Signed)
[facility] LCSW Group Therapy  01/11/2016 3:38 PM  Type of Therapy:  Group Therapy  Participation Level:  Did Not Attend  Modes of Intervention:  Discussion, Education, Socialization and Support  Summary of Progress/Problems:Todays topic: Grudges  Patients will be encouraged to discuss their thoughts, feelings, and behaviors as to why one holds on to grudges and reasons why people have grudges. Patients will process the impact of grudges on their daily lives and identify thoughts and feelings related to holding grudges. Patients will identify feelings and thoughts related to what life would look like without grudges.   Alden Bensinger L Katherleen Folkes MSW, LCSWA  01/11/2016, 3:38 PM   

## 2016-01-12 ENCOUNTER — Inpatient Hospital Stay: Payer: Commercial Managed Care - HMO

## 2016-01-12 ENCOUNTER — Encounter: Payer: Self-pay | Admitting: *Deleted

## 2016-01-12 ENCOUNTER — Inpatient Hospital Stay: Payer: Commercial Managed Care - HMO | Admitting: *Deleted

## 2016-01-12 MED ORDER — KETAMINE HCL 10 MG/ML IJ SOLN
INTRAMUSCULAR | Status: DC | PRN
  Administered 2016-01-12: 120 mg via INTRAVENOUS

## 2016-01-12 MED ORDER — LABETALOL HCL 5 MG/ML IV SOLN
INTRAVENOUS | Status: DC | PRN
  Administered 2016-01-12: 20 mg via INTRAVENOUS

## 2016-01-12 MED ORDER — SUCCINYLCHOLINE CHLORIDE 20 MG/ML IJ SOLN
INTRAMUSCULAR | Status: DC | PRN
  Administered 2016-01-12: 150 mg via INTRAVENOUS

## 2016-01-12 MED ORDER — SODIUM CHLORIDE 0.9 % IV SOLN
250.0000 mL | Freq: Once | INTRAVENOUS | Status: AC
  Administered 2016-01-14: 09:00:00 via INTRAVENOUS

## 2016-01-12 MED ORDER — DEXTROSE 5 % IV SOLN
INTRAVENOUS | Status: DC
  Administered 2016-01-12 (×2): via INTRAVENOUS

## 2016-01-12 NOTE — Plan of Care (Signed)
Problem: Diagnosis: Increased Risk For Suicide Attempt Goal: LTG-Patient Will Report Improved Mood and Deny Suicidal LTG (by discharge) Patient will report improved mood and deny suicidal ideation.  Outcome: Progressing Denies suicidal ideations.     

## 2016-01-12 NOTE — Addendum Note (Signed)
Addendum  created 01/12/16 1415 by Linward Natal, MD   Modules edited: Clinical Notes   Clinical Notes:  File: [id number]

## 2016-01-12 NOTE — Procedures (Signed)
ECT SERVICES Physician's Interval Evaluation & Treatment Note  Patient Identification: Christian [name] MRN:  [id number] Date of Evaluation:  01/12/2016 TX #: [id number]  MADRS:   MMSE:   P.E. Findings:  No change to physical exam except his blood pressure is a little on the lower side and his blood sugar is low today  Psychiatric Interval Note:  Mood continues to be depressed withdrawn and active  Subjective:  Patient is a 65 y.o. male seen for evaluation for Electroconvulsive Therapy. No specific complaint  Treatment Summary:   []   Right Unilateral             [x]  Bilateral   % Energy : 1.0 ms 100%   Impedance: 640 ohms  Seizure Energy Index: 2346 V squared  Postictal Suppression Index: 86%  Seizure Concordance Index: 97%  Medications  Pre Shock: Labetalol 20 mg ketamine 120 mg succinylcholine 150 mg  Post Shock:    Seizure Duration: 26 seconds by EMG 26 seconds by EEG   Comments: Patient showing minimal improvement. We are still scheduled for Wednesday but I am starting to discuss termination of ECT with the patient and family.   Lungs:  [x]   Clear to auscultation               []  Other:   Heart:    [x]   Regular rhythm             []  irregular rhythm    [x]   Previous H&P reviewed, patient examined and there are NO CHANGES                 []   Previous H&P reviewed, patient examined and there are changes noted.   [name], MD 2/13/20171:19 PM

## 2016-01-12 NOTE — Transfer of Care (Signed)
Immediate Anesthesia Transfer of Care Note  Patient: Christian [name]  Procedure(s) Performed: * No procedures listed *  Patient Location: PACU  Anesthesia Type:General  Level of Consciousness: patient cooperative and lethargic  Airway & Oxygen Therapy: Patient Spontanous Breathing and Patient connected to face mask oxygen  Post-op Assessment: Report given to RN and Post -op Vital signs reviewed and stable  Post vital signs: Reviewed and stable  Last Vitals:  Filed Vitals:   01/12/16 1103 01/12/16 1336  BP: 116/72 169/113  Pulse: 83 98  Temp: 36 C 37.2 C  Resp: 18 15    Complications: No apparent anesthesia complications

## 2016-01-12 NOTE — Plan of Care (Signed)
Problem: Ineffective individual coping Goal: LTG: Patient will report a decrease in negative feelings Outcome: Not Progressing Pt continues to verbalizes feelings of depression rated a 7 out of 10.

## 2016-01-12 NOTE — H&P (Signed)
Christian [name] is an 65 y.o. male.   Chief Complaint: Patient with bipolar disorder depressed. Continues to be very depressed withdrawn and not eating well. Denies active suicidal ideation and not interactive. HPI: Patient is showing little to no improvement currently  Past Medical History  Diagnosis Date  . Mood swings (HCC)   . Type II or unspecified type diabetes mellitus with unspecified complication, uncontrolled 06/02/2014  . Other and unspecified hyperlipidemia 06/02/2014  . OSA on CPAP 06/02/2014  . 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 Bipolar disorder (HCC)   . Hypertension   . Gout   . Bipolar 1 disorder (HCC)   . Hx of blood clots     History reviewed. No pertinent past surgical history.  Family History  Problem Relation Age of Onset  . Dementia Mother   . Arthritis Father   . Hypertension Father   . Heart failure Neg Hx   . Kidney failure Neg Hx   . Cancer Neg Hx   . Mental illness Sister   . Diabetes Sister    Social History:  reports that he has never smoked. He does not have any smokeless tobacco history on file. He reports that he does not drink alcohol or use illicit drugs.  Allergies: No Known Allergies  Medications Prior to Admission  Medication Sig Dispense Refill  . allopurinol (ZYLOPRIM) 300 MG tablet Take 1 tablet (300 mg total) by mouth daily. 30 tablet 0  . apixaban (ELIQUIS) 5 MG TABS tablet Take 1 tablet (5 mg total) by mouth 2 (two) times daily. 60 tablet 0  . atorvastatin (LIPITOR) 10 MG tablet Take 1 tablet (10 mg total) by mouth daily at 6 PM. 30 tablet 0  . clonazePAM (KLONOPIN) 1 MG tablet Take 1 tablet (1 mg total) by mouth at bedtime. 30 tablet 0  . finasteride (PROSCAR) 5 MG tablet Take 1 tablet (5 mg total) by mouth daily. 30 tablet 0  . furosemide (LASIX) 20 MG tablet Take 20 mg by mouth daily.     Marland Kitchen lisinopril (PRINIVIL,ZESTRIL) 40 MG tablet Take 40 mg by mouth daily.    . metFORMIN  (GLUCOPHAGE-XR) 500 MG 24 hr tablet Take 500 mg by mouth 2 (two) times daily.    . mirtazapine (REMERON) 45 MG tablet Take 1 tablet (45 mg total) by mouth at bedtime. 30 tablet 0  . OLANZapine (ZYPREXA) 15 MG tablet Take 2 tablets (30 mg total) by mouth at bedtime. 60 tablet 0  . tamsulosin (FLOMAX) 0.4 MG CAPS capsule Take 1 capsule (0.4 mg total) by mouth daily after supper. 30 capsule 0  . aspirin EC 81 MG EC tablet Take 1 tablet (81 mg total) by mouth daily. (Patient not taking: Reported on 12/29/2015) 30 tablet 0  . docusate sodium (COLACE) 100 MG capsule Take 2 capsules (200 mg total) by mouth 2 (two) times daily. (Patient not taking: Reported on 12/29/2015) 60 capsule 0  . polyethylene glycol (MIRALAX / GLYCOLAX) packet Take 17 g by mouth daily. (Patient not taking: Reported on 12/29/2015) 30 each 0    No results found for this or any previous visit (from the past 48 hour(s)). No results found.  Review of Systems  Constitutional: Positive for malaise/fatigue.  HENT: Negative.   Eyes: Negative.   Respiratory: Negative.   Cardiovascular: Negative.   Gastrointestinal: Negative.   Musculoskeletal: Negative.   Skin: Negative.   Neurological: Positive for weakness.  Psychiatric/Behavioral: Positive for depression and memory  loss. Negative for suicidal ideas, hallucinations and substance abuse. The patient has insomnia. The patient is not nervous/anxious.     Blood pressure 116/72, pulse 83, temperature 96.8 F (36 C), temperature source Oral, resp. rate 18, height 6' (1.829 m), weight 104.327 kg (230 lb), SpO2 99 %. Physical Exam  Vitals reviewed. Constitutional: He appears well-developed and well-nourished.  HENT:  Head: Normocephalic and atraumatic.  Eyes: Conjunctivae are normal. Pupils are equal, round, and reactive to light.  Neck: Normal range of motion.  Cardiovascular: Normal rate, regular rhythm and normal heart sounds.   Respiratory: Breath sounds normal. No respiratory  distress.  GI: Soft.  Musculoskeletal: Normal range of motion.  Neurological: He is alert.  Skin: Skin is warm and dry.  Psychiatric: His affect is blunt and inappropriate. His speech is delayed. He is slowed and withdrawn. He expresses inappropriate judgment. He is noncommunicative. He exhibits abnormal recent memory and abnormal remote memory. He is inattentive.     Assessment/Plan Spoke with the patient and his wife. We will reassess the plan but is starting to look like we may be ready to cut back on ECT treatments soon as we're seeing no benefit and look at discharge planning.  [name], MD 01/12/2016, 1:17 PM

## 2016-01-12 NOTE — Progress Notes (Signed)
Nutrition Follow-up    INTERVENTION:   Meals and Snacks: Cater to patient preferences Medical Food Supplement Therapy: continue Mighty Shakes  NUTRITION DIAGNOSIS:   Inadequate oral intake related to acute illness as evidenced by energy intake < 75% for > 7 days.   GOAL:   Patient will meet greater than or equal to 90% of their needs  MONITOR:    (Energy intake, Glucose profile)  REASON FOR ASSESSMENT:   LOS    ASSESSMENT:    Receiving ECT for bipolar depression   Diet Order:  Diet regular Room service appropriate?: Yes; Fluid consistency:: Thin Diet NPO time specified  Energy Intake: recorded po intake 71% of meals, NPO at times for ECT, receiving Mighty Shake as well   Recent Labs Lab 01/09/16 0630  CREATININE 1.34*    Meds: ss novolog, lasix, glucophage, remeron  Height:   Ht Readings from Last 1 Encounters:  01/12/16 6' (1.829 m)    Weight: relatively stable weight encounters this admission  Wt Readings from Last 1 Encounters:  01/12/16 230 lb (104.327 kg)    Filed Weights   01/07/16 0823 01/09/16 0827 01/12/16 1103  Weight: 230 lb (104.327 kg) 227 lb (102.967 kg) 230 lb (104.327 kg)    BMI:  Body mass index is 31.19 kg/(m^2).  Estimated Nutritional Needs:   Kcal:  (25-30 kcals/kg)2000-2400 kcals/d (Using IBW of 80kg)  Protein:  0.8-1.0 g/kg) 64-80 gm/d  Fluid:  (2000-2420ml/d  EDUCATION NEEDS:   No education needs identified at this time  LOW Care Level  Romelle Starcher MS, RD, LDN [phone] Pager  [phone] Weekend/On-Call Pager

## 2016-01-12 NOTE — Anesthesia Postprocedure Evaluation (Signed)
Anesthesia Post Note  Patient: Christian [name]  Procedure(s) Performed: * No procedures listed *  Patient location during evaluation: PACU Anesthesia Type: General Level of consciousness: awake Pain management: pain level controlled Vital Signs Assessment: post-procedure vital signs reviewed and stable Respiratory status: spontaneous breathing Cardiovascular status: blood pressure returned to baseline Postop Assessment: no headache Anesthetic complications: no    Last Vitals:  Filed Vitals:   01/12/16 1356 01/12/16 1406  BP: 140/97 126/75  Pulse: 93 93  Temp:  37.1 C  Resp: 17 16    Last Pain:  Filed Vitals:   01/12/16 1410  PainSc: Asleep                 Yolande Skoda M

## 2016-01-12 NOTE — [facility] Group Notes (Signed)
[facility] Group Notes:  (Nursing/MHT/Case Management/Adjunct)  Date:  01/12/2016  Time:  12:00 PM  Type of Therapy:  Psychoeducational Skills  Participation Level:  Did Not Attend   Darrow Bussing 01/12/2016, 12:00 PM

## 2016-01-12 NOTE — Progress Notes (Signed)
D: Pt remains in his room this evening. Pt comes out for evening medications. Pt continues to verbalize feelings of depression that are "about the same", rated a 7 out of 10. Denies SI/HI/AVH at this time. Pt c/o heartburn, refuses PRN medication. Pt reports that he went to one group today. No concerns or complaints at this time. A: Emotional support provided. Pt encouraged to attend group and be active on the unit. Medications administered as prescribed. Pt is NPO after midnight for ECT in the morning. q15 minute safety checks maintained. R: Pt remains free from harm.

## 2016-01-12 NOTE — [facility] Group Notes (Signed)
[facility] LCSW Group Therapy  01/12/2016 2:17 PM  Type of Therapy:  Group Therapy  Participation Level:  Did Not Attend  Summary of Progress/Problems: Patient has ECT today and did not attend group.  Lulu Riding, MSW, LCSWA 01/12/2016, 2:17 PM

## 2016-01-12 NOTE — Anesthesia Preprocedure Evaluation (Addendum)
Anesthesia Evaluation  Patient identified by MRN, date of birth, ID band Patient awake    Reviewed: Allergy & Precautions, NPO status , Patient's Chart, lab work & pertinent test results  Airway Mallampati: III  TM Distance: >3 FB Neck ROM: Limited    Dental  (+) Poor Dentition   Pulmonary shortness of breath and with exertion, sleep apnea and Continuous Positive Airway Pressure Ventilation ,           Cardiovascular Exercise Tolerance: Poor hypertension, Pt. on medications and Pt. on home beta blockers + Peripheral Vascular Disease  Normal cardiovascular exam  On elequis.   Neuro/Psych Depression Bipolar Disorder    GI/Hepatic   Endo/Other  diabetes, Type 2BG 88.  Renal/GU      Musculoskeletal   Abdominal (+) + obese,   Peds  Hematology   Anesthesia Other Findings   Reproductive/Obstetrics                            Anesthesia Physical Anesthesia Plan  ASA: III  Anesthesia Plan: General   Post-op Pain Management:    Induction: Intravenous  Airway Management Planned: Mask  Additional Equipment:   Intra-op Plan:   Post-operative Plan:   Informed Consent: I have reviewed the patients History and Physical, chart, labs and discussed the procedure including the risks, benefits and alternatives for the proposed anesthesia with the patient or authorized representative who has indicated his/her understanding and acceptance.     Plan Discussed with: CRNA  Anesthesia Plan Comments:         Anesthesia Quick Evaluation

## 2016-01-12 NOTE — [facility] Group Notes (Signed)
[facility] LCSW Aftercare Discharge Planning Group Note   01/12/2016 10:31 AM  Participation Quality:  Patient is scheduled ECT today and did not attend group.    Lulu Riding, MSW, LCSWA

## 2016-01-12 NOTE — Progress Notes (Signed)
Patient tolerated ECT today.He rated his depression 5/10.Denies suicidal ideations.Stayed in most of the shift.Did not attend groups.Compliant with medications.Appetite good.

## 2016-01-12 NOTE — Progress Notes (Signed)
[facility] MD Progress Note  01/12/2016 6:45 PM Christian [name]  MRN:  [id number] Subjective:  Follow-up for 65 year old man with bipolar disorder depressed. Patient interviewed today. He also had ECT today. I spoke briefly with his wife. Notes over the weekend and vital signs reviewed. Patient continues to be extremely depressed and doesn't seem to be any better than he was last Friday. Barely ever gets out of bed. Eats only a little bit. Blood pressure was running low this morning. He shows little sign of taking any effort to improve his condition. Principal Problem: Bipolar disorder, current episode depressed, severe, with psychotic features (HCC) Diagnosis:   Patient Active Problem List   Diagnosis Date Noted  . Bipolar I disorder, most recent episode depressed, severe w psychosis (HCC) [F31.5] 12/22/2015  . Bipolar affective disorder, depressed, severe, with psychotic behavior (HCC) [F31.5]   . Bipolar I disorder, current or most recent episode depressed, with psychotic features (HCC) [F31.5]   . Bipolar I disorder, most recent episode depressed (HCC) [F31.30]   . Severe bipolar I disorder with depression (HCC) [F31.4]   . Bipolar I disorder, most recent episode depressed, severe with psychotic features (HCC) [F31.5]   . Overdose [T50.901A] 09/11/2015  . Acute respiratory failure with hypoxia (HCC) [J96.01] 09/05/2015  . Elevated troponin [R79.89] 09/05/2015  . Somnolence [R40.0] 09/05/2015  . Bipolar depression (HCC) [F31.30] 09/05/2015  . Acute bilateral deep vein thrombosis (DVT) of femoral veins (HCC) [I82.413] 09/05/2015  . Bilateral pulmonary embolism (HCC) [I26.99] 09/02/2015  . Labile hypertension [I10]   . Pulmonary embolism with acute cor pulmonale (HCC) [I26.09]   . Dyspnea [R06.00]   . Orthostatic hypotension [I95.1]   . Bipolar disorder, in partial remission, most recent episode depressed (HCC) [F31.75]   . Syncope, near [R55] 08/31/2015  . Affective psychosis, bipolar (HCC)  [F31.9]   . Bipolar disorder, now depressed (HCC) [F31.30] 08/28/2015  . Bipolar I disorder, severe, current or most recent episode depressed, with psychotic features (HCC) [F31.5]   . Pressure ulcer [L89.90] 07/30/2015  . Protein-calorie malnutrition, severe (HCC) [E43] 07/30/2015  . Dehydration [E86.0] 07/29/2015  . Bipolar disorder, current episode depressed, severe, with psychotic features (HCC) [F31.5] 07/09/2015  . Diabetes (HCC) [E11.9] 07/09/2015  . UTI (urinary tract infection) [N39.0] 06/19/2014  . Positive blood culture [R78.81] 06/19/2014  . Bipolar affective disorder, current episode manic with psychotic symptoms (HCC) [F31.2] 06/17/2014  . Type II or unspecified type diabetes mellitus with unspecified complication, uncontrolled [E11.8, E11.65] 06/02/2014  . Other and unspecified hyperlipidemia [E78.5] 06/02/2014  . OSA on CPAP [G47.33] 06/02/2014  . 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 Gout [M10.9] 06/02/2014  . Hypertension [I10]    Total Time spent with patient: 30 minutes  Past Psychiatric History: Long history of bipolar disorder with history of suicide attempts and now a history of aggression  Past Medical History:  Past Medical History  Diagnosis Date  . Mood swings (HCC)   . Type II or unspecified type diabetes mellitus with unspecified complication, uncontrolled 06/02/2014  . Other and unspecified hyperlipidemia 06/02/2014  . OSA on CPAP 06/02/2014  . 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 Bipolar disorder (HCC)   . Hypertension   . Gout   . Bipolar 1 disorder (HCC)   . Hx of blood clots    History reviewed. No pertinent past surgical history. Family History:  Family History  Problem Relation Age of Onset  . Dementia  Mother   . Arthritis Father   . Hypertension Father   . Heart failure Neg Hx   . Kidney failure Neg Hx   . Cancer Neg Hx   . Mental illness Sister    . Diabetes Sister    Family Psychiatric  History: Positive for bipolar disorder and anxiety Social History:  History  Alcohol Use No     History  Drug Use No    Social History   Social History  . Marital Status: Married    Spouse Name: N/A  . Number of Children: N/A  . Years of Education: N/A   Social History Main Topics  . Smoking status: Never Smoker   . Smokeless tobacco: None  . Alcohol Use: No  . Drug Use: No  . Sexual Activity: Not Currently   Other Topics Concern  . None   Social History Narrative   *The patient has been married for over 30 years and is currently on disability. He lives with his wife in the [place] area and has one daughter.             Additional Social History:                         Sleep: Poor  Appetite:  Fair  Current Medications: Current Facility-Administered Medications  Medication Dose Route Frequency Provider Last Rate Last Dose  . 0.9 %  sodium chloride infusion  250 mL Intravenous Once Audery Amel, MD      . acetaminophen (TYLENOL) tablet 650 mg  650 mg Oral Q6H PRN Audery Amel, MD   650 mg at 01/07/16 1418  . allopurinol (ZYLOPRIM) tablet 300 mg  300 mg Oral Daily Audery Amel, MD   300 mg at 01/12/16 1451  . alum & mag hydroxide-simeth (MAALOX/MYLANTA) 200-200-20 MG/5ML suspension 30 mL  30 mL Oral Q4H PRN Audery Amel, MD      . apixaban (ELIQUIS) tablet 5 mg  5 mg Oral BID Audery Amel, MD   5 mg at 01/12/16 1450  . aspirin EC tablet 81 mg  81 mg Oral Daily Audery Amel, MD   81 mg at 01/12/16 1451  . atorvastatin (LIPITOR) tablet 10 mg  10 mg Oral q1800 Audery Amel, MD   10 mg at 01/12/16 1710  . dextrose 5 % solution   Intravenous Continuous Audery Amel, MD 10 mL/hr at 01/12/16 1130    . docusate sodium (COLACE) capsule 100 mg  100 mg Oral BID Audery Amel, MD   100 mg at 01/12/16 1451  . fentaNYL (SUBLIMAZE) injection 25 mcg  25 mcg Intravenous Q5 min PRN Berdine Addison, MD      .  fentaNYL (SUBLIMAZE) injection 25 mcg  25 mcg Intravenous Q5 min PRN Gijsbertus Georgana Curio, MD      . finasteride (PROSCAR) tablet 5 mg  5 mg Oral Daily Audery Amel, MD   5 mg at 01/12/16 1450  . furosemide (LASIX) tablet 20 mg  20 mg Oral Daily Audery Amel, MD   20 mg at 01/12/16 1451  . insulin aspart (novoLOG) injection 0-9 Units  0-9 Units Subcutaneous TID WC Darliss Ridgel, MD   2 Units at 01/12/16 1713  . lisinopril (PRINIVIL,ZESTRIL) tablet 40 mg  40 mg Oral Daily Jolanta B Pucilowska, MD   40 mg at 01/12/16 1450  . lithium carbonate (ESKALITH) CR tablet 450 mg  450  mg Oral QHS Audery Amel, MD   450 mg at 01/11/16 2118  . magnesium hydroxide (MILK OF MAGNESIA) suspension 30 mL  30 mL Oral Daily PRN Audery Amel, MD   30 mL at 01/08/16 2315  . metFORMIN (GLUCOPHAGE) tablet 500 mg  500 mg Oral BID WC Audery Amel, MD   500 mg at 01/12/16 1710  . metoprolol tartrate (LOPRESSOR) tablet 12.5 mg  12.5 mg Oral BID Audery Amel, MD   12.5 mg at 01/12/16 0649  . mirtazapine (REMERON) tablet 45 mg  45 mg Oral QHS Audery Amel, MD   45 mg at 01/11/16 2119  . ondansetron (ZOFRAN) injection 4 mg  4 mg Intravenous Once PRN Berdine Addison, MD      . ondansetron [facility]) injection 4 mg  4 mg Intravenous Once PRN Gijsbertus Georgana Curio, MD      . ondansetron (ZOFRAN-ODT) disintegrating tablet 4 mg  4 mg Oral Q8H PRN Audery Amel, MD   4 mg at 01/09/16 1340  . polyethylene glycol (MIRALAX / GLYCOLAX) packet 17 g  17 g Oral Daily Audery Amel, MD   17 g at 01/12/16 1452  . QUEtiapine (SEROQUEL) tablet 400 mg  400 mg Oral QHS Audery Amel, MD   400 mg at 01/11/16 2119  . tamsulosin (FLOMAX) capsule 0.4 mg  0.4 mg Oral QPC supper Audery Amel, MD   0.4 mg at 01/12/16 1711    Lab Results:  No results found for this or any previous visit (from the past 48 hour(s)).  Physical Findings: AIMS: Facial and Oral Movements Muscles of Facial Expression: None, normal Lips and Perioral  Area: None, normal Jaw: None, normal Tongue: None, normal,Extremity Movements Upper (arms, wrists, hands, fingers): None, normal Lower (legs, knees, ankles, toes): None, normal, Trunk Movements Neck, shoulders, hips: None, normal, Overall Severity Severity of abnormal movements (highest score from questions above): None, normal Incapacitation due to abnormal movements: None, normal Patient's awareness of abnormal movements (rate only patient's report): No Awareness, Dental Status Current problems with teeth and/or dentures?: No Does patient usually wear dentures?: No  CIWA:    COWS:     Musculoskeletal: Strength & Muscle Tone: decreased Gait & Station: shuffle Patient leans: N/A  Psychiatric Specialty Exam: Review of Systems  HENT: Negative.   Eyes: Negative.   Respiratory: Negative.   Cardiovascular: Negative.   Gastrointestinal: Negative.   Musculoskeletal: Negative.   Skin: Negative.   Neurological: Positive for weakness.  Psychiatric/Behavioral: Positive for depression and memory loss. Negative for suicidal ideas, hallucinations and substance abuse. The patient is nervous/anxious and has insomnia.     Blood pressure 126/75, pulse 93, temperature 98.8 F (37.1 C), temperature source Temporal, resp. rate 16, height 6' (1.829 m), weight 104.327 kg (230 lb), SpO2 97 %.Body mass index is 31.19 kg/(m^2).  General Appearance: Casual  Eye Contact::  Fair  Speech:  Slow  Volume:  Decreased  Mood:  Dysphoric  Affect:  Constricted  Thought Process:  Tangential  Orientation:  Full (Time, Place, and Person)  Thought Content:  Rumination  Suicidal Thoughts:  No  Homicidal Thoughts:  No  Memory:  Immediate;   Fair Recent;   Fair Remote;   Fair  Judgement:  Poor  Insight:  Fair  Psychomotor Activity:  Psychomotor Retardation  Concentration:  Fair  Recall:  [employer] of Knowledge:Fair  Language: Fair  Akathisia:  No  Handed:  Right  AIMS (if indicated):  Assets:   Desire for Improvement Resilience Social Support  ADL's:  Impaired  Cognition: Impaired,  Mild  Sleep:  Number of Hours: 8   Treatment Plan Summary: Daily contact with patient to assess and evaluate symptoms and progress in treatment, Medication management and Plan So far patient has not shown any response with multiple ECT treatments. He is also on appropriate medicine for bipolar depression several times over. He is not eating not taking care of himself although he denies being suicidal and denies psychotic symptoms. I have reviewed the situation with his wife and I believe that we probably should look into referral to [facility] as there is little indication that he will be getting better on our inpatient unit. I will reevaluate tomorrow and may decide to discontinue ECT in the near future. No change to treatment plan her medicine today. Counseling completed with patient.  [name], MD 01/12/2016, 6:45 PM

## 2016-01-12 NOTE — Progress Notes (Signed)
Recreation Therapy Notes  Date: 02.13.17 Time: 3:00 pm Location: Craft Room  Group Topic: Self-expression  Goal Area(s) Addresses:  Patient will identify one color per emotion listed on wheel. Patient will verbalize benefit of use art as a means of self-expression. Patient will verbalize one emotion experienced during session.  Behavioral Response: Did not attend  Intervention: Emotion Wheel  Activity: Patients were given an Emotion Wheel worksheet with 7 different emotions and instructed to pick a color for each emotion.  Education: LRT educated group on different forms of self-expression.  Education Outcome: Patient did not attend group.  Clinical Observations/Feedback: Patient did not attend group.  Jacquelynn Cree, LRT/CTRS 01/12/2016 4:19 PM

## 2016-01-13 LAB — CBC
HCT: 45.1 % (ref 40.0–52.0)
Hemoglobin: 14.7 g/dL (ref 13.0–18.0)
MCH: 28.7 pg (ref 26.0–34.0)
MCHC: 32.6 g/dL (ref 32.0–36.0)
MCV: 88 fL (ref 80.0–100.0)
Platelets: 164 10*3/uL (ref 150–440)
RBC: 5.13 MIL/uL (ref 4.40–5.90)
RDW: 13.4 % (ref 11.5–14.5)
WBC: 5.9 10*3/uL (ref 3.8–10.6)

## 2016-01-13 LAB — LITHIUM LEVEL: Lithium Lvl: 0.82 mmol/L (ref 0.60–1.20)

## 2016-01-13 LAB — CREATININE, SERUM
Creatinine, Ser: 2.06 mg/dL — ABNORMAL HIGH (ref 0.61–1.24)
GFR calc Af Amer: 38 mL/min — ABNORMAL LOW (ref >60)
GFR calc non Af Amer: 33 mL/min — ABNORMAL LOW (ref >60)

## 2016-01-13 NOTE — Progress Notes (Signed)
[facility] MD Progress Note  01/13/2016 4:07 PM Christian [name]  MRN:  [id number] Subjective:  Follow-up Tuesday the 14th. Patient himself has no specific new complaint. Once again I find him in bed pretty much immobile although awake. He tells me that he is thinking about wishing he were out of the hospital but realizes that he is not going to be able to go stay with family. Denies that he's having any thoughts about killing himself. Despite my every day encouragement he is not going to groups or getting up. He also seems to be consuming less and less food and fluid and I note today that his creatinine is getting higher. I checked his lithium level this morning and it is 0.8. We will recheck his labs and another couple days.  Principal Problem: Bipolar disorder, current episode depressed, severe, with psychotic features ([place]) Diagnosis:   Patient Active Problem List   Diagnosis Date Noted  . Bipolar I disorder, most recent episode depressed, severe w psychosis ([place]) [F31.5] 12/22/2015  . Bipolar affective disorder, depressed, severe, with psychotic behavior ([place]) [F31.5]   . Bipolar I disorder, current or most recent episode depressed, with psychotic features ([place]) [F31.5]   . Bipolar I disorder, most recent episode depressed ([place]) [F31.30]   . Severe bipolar I disorder with depression ([place]) [F31.4]   . Bipolar I disorder, most recent episode depressed, severe with psychotic features ([place]) [F31.5]   . Overdose [T50.901A] 09/11/2015  . Acute respiratory failure with hypoxia ([place]) [J96.01] 09/05/2015  . Elevated troponin [R79.89] 09/05/2015  . Somnolence [R40.0] 09/05/2015  . Bipolar depression ([place]) [F31.30] 09/05/2015  . Acute bilateral deep vein thrombosis (DVT) of femoral veins (HCC) [I82.413] 09/05/2015  . Bilateral pulmonary embolism ([place]) [I26.99] 09/02/2015  . Labile hypertension [I10]   . Pulmonary embolism with acute cor pulmonale ([place]) [I26.09]   . Dyspnea [R06.00]   . Orthostatic  hypotension [I95.1]   . Bipolar disorder, in partial remission, most recent episode depressed ([place]) [F31.75]   . Syncope, near [R55] 08/31/2015  . Affective psychosis, bipolar ([place]) [F31.9]   . Bipolar disorder, now depressed ([place]) [F31.30] 08/28/2015  . Bipolar I disorder, severe, current or most recent episode depressed, with psychotic features ([place]) [F31.5]   . Pressure ulcer [L89.90] 07/30/2015  . Protein-calorie malnutrition, severe ([place]) [E43] 07/30/2015  . Dehydration [E86.0] 07/29/2015  . Bipolar disorder, current episode depressed, severe, with psychotic features ([place]) [F31.5] 07/09/2015  . Diabetes ([place]) [E11.9] 07/09/2015  . UTI (urinary tract infection) [N39.0] 06/19/2014  . Positive blood culture [R78.81] 06/19/2014  . Bipolar affective disorder, current episode manic with psychotic symptoms ([place]) [F31.2] 06/17/2014  . Type II or unspecified type diabetes mellitus with unspecified complication, uncontrolled [E11.8, E11.65] 06/02/2014  . Other and unspecified hyperlipidemia [E78.5] 06/02/2014  . OSA on CPAP [G47.33] 06/02/2014  . 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 Gout [M10.9] 06/02/2014  . Hypertension [I10]    Total Time spent with patient: 30 minutes  Past Psychiatric History: Long history of bipolar disorder with history of suicide attempts and now a history of aggression  Past Medical History:  Past Medical History  Diagnosis Date  . Mood swings ([place])   . Type II or unspecified type diabetes mellitus with unspecified complication, uncontrolled 06/02/2014  . Other and unspecified hyperlipidemia 06/02/2014  . OSA on CPAP 06/02/2014  . 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 Bipolar disorder ([place])   . Hypertension   .  Gout   . Bipolar 1 disorder ([place])   . Hx of blood clots    History reviewed. No pertinent past surgical history. Family History:  Family History  Problem  Relation Age of Onset  . Dementia Mother   . Arthritis Father   . Hypertension Father   . Heart failure Neg Hx   . Kidney failure Neg Hx   . Cancer Neg Hx   . Mental illness Sister   . Diabetes Sister    Family Psychiatric  History: Positive for bipolar disorder and anxiety Social History:  History  Alcohol Use No     History  Drug Use No    Social History   Social History  . Marital Status: Married    Spouse Name: N/A  . Number of Children: N/A  . Years of Education: N/A   Social History Main Topics  . Smoking status: Never Smoker   . Smokeless tobacco: None  . Alcohol Use: No  . Drug Use: No  . Sexual Activity: Not Currently   Other Topics Concern  . None   Social History Narrative   *The patient has been married for over 30 years and is currently on disability. He lives with his wife in the [place] area and has one daughter.             Additional Social History:                         Sleep: Poor  Appetite:  Fair  Current Medications: Current Facility-Administered Medications  Medication Dose Route Frequency Provider Last Rate Last Dose  . 0.9 %  sodium chloride infusion  250 mL Intravenous Once Gonzella Lex, MD      . acetaminophen (TYLENOL) tablet 650 mg  650 mg Oral Q6H PRN Gonzella Lex, MD   650 mg at 01/07/16 1418  . allopurinol (ZYLOPRIM) tablet 300 mg  300 mg Oral Daily Gonzella Lex, MD   300 mg at 01/13/16 0853  . alum & mag hydroxide-simeth (MAALOX/MYLANTA) 200-200-20 MG/5ML suspension 30 mL  30 mL Oral Q4H PRN Gonzella Lex, MD      . apixaban (ELIQUIS) tablet 5 mg  5 mg Oral BID Gonzella Lex, MD   5 mg at 01/13/16 0852  . aspirin EC tablet 81 mg  81 mg Oral Daily Gonzella Lex, MD   81 mg at 01/13/16 0853  . atorvastatin (LIPITOR) tablet 10 mg  10 mg Oral q1800 Gonzella Lex, MD   10 mg at 01/12/16 1710  . dextrose 5 % solution   Intravenous Continuous Gonzella Lex, MD 10 mL/hr at 01/12/16 1130    . docusate sodium  (COLACE) capsule 100 mg  100 mg Oral BID Gonzella Lex, MD   100 mg at 01/13/16 0853  . fentaNYL (SUBLIMAZE) injection 25 mcg  25 mcg Intravenous Q5 min PRN Gunnar Bulla, MD      . fentaNYL (SUBLIMAZE) injection 25 mcg  25 mcg Intravenous Q5 min PRN Gijsbertus Lonia Mad, MD      . finasteride (PROSCAR) tablet 5 mg  5 mg Oral Daily Gonzella Lex, MD   5 mg at 01/13/16 0853  . furosemide (LASIX) tablet 20 mg  20 mg Oral Daily Gonzella Lex, MD   20 mg at 01/13/16 0853  . insulin aspart (novoLOG) injection 0-9 Units  0-9 Units Subcutaneous TID WC Chauncey Mann, MD  1 Units at 01/13/16 1207  . lisinopril (PRINIVIL,ZESTRIL) tablet 40 mg  40 mg Oral Daily Clovis Fredrickson, MD   40 mg at 01/13/16 0852  . lithium carbonate (ESKALITH) CR tablet 450 mg  450 mg Oral QHS Gonzella Lex, MD   450 mg at 01/12/16 2103  . magnesium hydroxide (MILK OF MAGNESIA) suspension 30 mL  30 mL Oral Daily PRN Gonzella Lex, MD   30 mL at 01/08/16 2315  . metFORMIN (GLUCOPHAGE) tablet 500 mg  500 mg Oral BID WC Gonzella Lex, MD   500 mg at 01/13/16 0852  . metoprolol tartrate (LOPRESSOR) tablet 12.5 mg  12.5 mg Oral BID Gonzella Lex, MD   12.5 mg at 01/13/16 0853  . mirtazapine (REMERON) tablet 45 mg  45 mg Oral QHS Gonzella Lex, MD   45 mg at 01/12/16 2103  . ondansetron (ZOFRAN) injection 4 mg  4 mg Intravenous Once PRN Gunnar Bulla, MD      . ondansetron [facility]) injection 4 mg  4 mg Intravenous Once PRN Gijsbertus Lonia Mad, MD      . ondansetron (ZOFRAN-ODT) disintegrating tablet 4 mg  4 mg Oral Q8H PRN Gonzella Lex, MD   4 mg at 01/09/16 1340  . polyethylene glycol (MIRALAX / GLYCOLAX) packet 17 g  17 g Oral Daily Gonzella Lex, MD   17 g at 01/13/16 0852  . QUEtiapine (SEROQUEL) tablet 400 mg  400 mg Oral QHS Gonzella Lex, MD   400 mg at 01/12/16 2103  . tamsulosin (FLOMAX) capsule 0.4 mg  0.4 mg Oral QPC supper Gonzella Lex, MD   0.4 mg at 01/12/16 1711    Lab Results:  Results for  orders placed or performed during the hospital encounter of 12/22/15 (from the past 48 hour(s))  Creatinine, serum     Status: Abnormal   Collection Time: 01/13/16  6:44 AM  Result Value Ref Range   Creatinine, Ser 2.06 (H) 0.61 - 1.24 mg/dL   GFR calc non Af Amer 33 (L) >60 mL/min   GFR calc Af Amer 38 (L) >60 mL/min    Comment: (NOTE) The eGFR has been calculated using the CKD EPI equation. This calculation has not been validated in all clinical situations. eGFR's persistently <60 mL/min signify possible Chronic Kidney Disease.   CBC     Status: None   Collection Time: 01/13/16  6:44 AM  Result Value Ref Range   WBC 5.9 3.8 - 10.6 K/uL   RBC 5.13 4.40 - 5.90 MIL/uL   Hemoglobin 14.7 13.0 - 18.0 g/dL   HCT 45.1 40.0 - 52.0 %   MCV 88.0 80.0 - 100.0 fL   MCH 28.7 26.0 - 34.0 pg   MCHC 32.6 32.0 - 36.0 g/dL   RDW 13.4 11.5 - 14.5 %   Platelets 164 150 - 440 K/uL  Lithium level     Status: None   Collection Time: 01/13/16  6:44 AM  Result Value Ref Range   Lithium Lvl 0.82 0.60 - 1.20 mmol/L    Physical Findings: AIMS: Facial and Oral Movements Muscles of Facial Expression: None, normal Lips and Perioral Area: None, normal Jaw: None, normal Tongue: None, normal,Extremity Movements Upper (arms, wrists, hands, fingers): None, normal Lower (legs, knees, ankles, toes): None, normal, Trunk Movements Neck, shoulders, hips: None, normal, Overall Severity Severity of abnormal movements (highest score from questions above): None, normal Incapacitation due to abnormal movements: None, normal Patient's awareness of  abnormal movements (rate only patient's report): No Awareness, Dental Status Current problems with teeth and/or dentures?: No Does patient usually wear dentures?: No  CIWA:    COWS:     Musculoskeletal: Strength & Muscle Tone: decreased Gait & Station: shuffle Patient leans: N/A  Psychiatric Specialty Exam: Review of Systems  HENT: Negative.   Eyes: Negative.    Respiratory: Negative.   Cardiovascular: Negative.   Gastrointestinal: Negative.   Musculoskeletal: Negative.   Skin: Negative.   Neurological: Positive for weakness.  Psychiatric/Behavioral: Positive for depression and memory loss. Negative for suicidal ideas, hallucinations and substance abuse. The patient is nervous/anxious and has insomnia.     Blood pressure 110/68, pulse 92, temperature 98.6 F (37 C), temperature source Oral, resp. rate 16, height 6' (1.829 m), weight 104.327 kg (230 lb), SpO2 97 %.Body mass index is 31.19 kg/(m^2).  General Appearance: Casual  Eye Contact::  Fair  Speech:  Slow  Volume:  Decreased  Mood:  Dysphoric  Affect:  Constricted  Thought Process:  Tangential  Orientation:  Full (Time, Place, and Person)  Thought Content:  Rumination  Suicidal Thoughts:  No  Homicidal Thoughts:  No  Memory:  Immediate;   Fair Recent;   Fair Remote;   Fair  Judgement:  Poor  Insight:  Fair  Psychomotor Activity:  Psychomotor Retardation  Concentration:  Fair  Recall:  [employer] of Knowledge:Fair  Language: Fair  Akathisia:  No  Handed:  Right  AIMS (if indicated):     Assets:  Desire for Improvement Resilience Social Support  ADL's:  Impaired  Cognition: Impaired,  Mild  Sleep:  Number of Hours: 8   Treatment Plan Summary: Continue daily contact with the patient and keeping him updated about the situation. He is on appropriate medicine for bipolar depression and we have tried increasing his medicine as we can. He has been getting ECT treatment and so far seems to show no benefit despite tolerating treatment well. Patient is not actively trying to kill himself in the hospital but is not safe for discharge particularly given his past violent behavior recently to his wife. Blood pressure goes up and down a little bit during the day currently it is okay.  Plan is for ECT tomorrow although this may be about the limit. I have filed commitment papers for him  because we are discussing referral to [facility] at this time. No change to medicine note change to lithium but as I mentioned we will check it again to see if his kidneys are getting worse. Alethia Berthold, MD 01/13/2016, 4:07 PM

## 2016-01-13 NOTE — Progress Notes (Signed)
D: Pt rates depression as a 5 out of 10 this evening. Denies SI/HI/AVH at this time. Denies pain. Pt ambulates with the use of a walker. Gait is steady. Pt stays in room most of the shift.  Pt states "I don't want to go to [facility], that's where all the crazy people go." A: Emotional support and encouragement provided. Medications administered as prescribed. q15 minute safety checks maintained. R: Pt remains free from harm.

## 2016-01-13 NOTE — Plan of Care (Signed)
Problem: [facility] Participation in Recreation Therapeutic Interventions Goal: STG-Patient will demonstrate improved self esteem by identif STG: Self-Esteem - Within 4 treatment sessions, patient will verbalize at least 5 positive affirmation statements in each of 2 treatment sessions to increase self-esteem post d/c.  Outcome: Completed/Met Date Met:  01/13/16 Treatment Session 3; Completed 2 out of 2: At approximately 9:25 am, LRT met with patient in patient room. Patient verbalized 5 positive affirmation statements. Patient reported it felt "empowering". LRT encouraged patient to continue saying positive affirmation statements. Intervention Used: I Am statements  Leonette Monarch, LRT/CTRS 02.14.17 11:32 am Goal: STG-Other Recreation Therapy Goal (Specify) STG: Stress Management - Within 4 treatment sessions, patient will verbalize understanding of the stress management techniques in each of 2 treatment sessions to increase stress management skills post d/c.  Outcome: Completed/Met Date Met:  01/13/16 Treatment Session 3; Completed 2 out of 2: At approximately 9:25 am, LRT met with patient in patient room. Patient verbalized understanding of the stress management techniques. LRT encouraged patient to practice the techniques post d/c. Intervention Used: Stress Management handouts  Leonette Monarch, LRT/CTRS 02.14.17 11:34 am

## 2016-01-13 NOTE — Plan of Care (Signed)
Problem: Ineffective individual coping Goal: LTG: Patient will report a decrease in negative feelings Outcome: Not Progressing Pt states that depression is "about the same" and rates it a 5 out of 10 Goal: STG: Patient will remain free from self harm Outcome: Progressing Pt remains free from harm

## 2016-01-13 NOTE — Clinical Social Work Note (Addendum)
[facility] auth # provided by [facility], [id number].  Referral faxed, phone demographics completed. Patient under review at [facility]  [place], [place] Lead Clinical Social Worker Phone:  [phone]

## 2016-01-13 NOTE — Progress Notes (Signed)
Recreation Therapy Notes  Date: 02.14.17 Time: 3:00 pm Location: Community Room  Group Topic: Goal Setting  Goal Area(s) Addresses:  Patient will write at least one goal. Patient will write at least one obstacle.  Behavioral Response: Did not attend  Intervention: Recovery Goal Chart  Activity: Patients were instructed to make a Recovery Goal Chart including goals, obstacles, date goals started, and date goals achieved.  Education: LRT educated patients on ways they can overcome their obstacles.  Education Outcome: Patient did not attend group.  Clinical Observations/Feedback: Patient did not attend group.  Jacquelynn Cree, LRT/CTRS 01/13/2016 4:06 PM

## 2016-01-13 NOTE — [facility] Group Notes (Signed)
[facility] Group Notes:  (Nursing/MHT/Case Management/Adjunct)  Date:  01/13/2016  Time:  1:51 PM  Type of Therapy:  Psychoeducational Skills  Participation Level:  Did Not Attend    Mickey Farber 01/13/2016, 1:51 PM

## 2016-01-13 NOTE — [facility] Group Notes (Signed)
[facility] Group Notes:  (Nursing/MHT/Case Management/Adjunct)  Date:  01/13/2016  Time:  10:00 PM  Type of Therapy:  Group Therapy  Participation Level:  Did Not Attend   Christian [name] Christian [name] Christian [name] 01/13/2016, 10:00 PM

## 2016-01-13 NOTE — Progress Notes (Signed)
Patient rated his depression 8/10.Denies suicidal ideations.Stayed in bed most of the time.Encouraged for groups.Did not attend groups.Compliant with medications.

## 2016-01-13 NOTE — Tx Team (Signed)
Interdisciplinary Treatment Plan Update (Adult)  Date:  01/13/2016 Time Reviewed:  3:04 PM  Progress in Treatment: Attending groups: No. Participating in groups: No. Taking medication as prescribed: Yes. Tolerating medication: Yes. Family/Significant othe contact made: Yes, individual(s) contacted: wife Patient understands diagnosis: Yes. Discussing patient identified problems/goals with staff: Yes. Medical problems stabilized or resolved: Yes. Denies suicidal/homicidal ideation: Yes. Issues/concerns per patient self-inventory: Yes. Other:  New problem(s) identified: No, Describe: NA  Discharge Plan or Barriers:Pt unable to return home, lacks funding for group home or ALF placement. Wife is unwilling to have him come home as she feels unsafe. There are no family members at this time that he will be able to stay with. Mrs. Maron's plan is to help him with a hotel/boarding house until other plans can be made if nothing can be done from the hospital.  Reason for Continuation of Hospitalization: Depression Medication stabilization Suicidal ideation Other; describe ECT  Comments:  Pt remains depressed with minimal improvement even after appropriate course of ECT.  Will refer to [facility] due to lack of improvement and acute symptoms remaining.  Estimated length of stay: 7 days   New goal(s):  Review of initial/current patient goals per problem list:  1. Goal(s): Patient will participate in aftercare plan  Met:No  Target date: at discharge  As evidenced by: Patient will participate within aftercare plan AEB aftercare provider and housing plan at discharge being identified. 01/07/16: Patient lethargic and very depressed and not able to participate in conversations about aftercare plan 01/09/16: patient was awake and cooperative today agreeing to PASRR consent to move forward towards assisted living placement but still no living arrangement at discharge.   01/13/2016-no new  developments, [place] referral completed today 2. Goal (s): Patient will exhibit decreased depressive symptoms and suicidal ideations.  Met:No  Target date: at discharge  As evidenced by: Patient will utilize self rating of depression at 3 or below and demonstrate decreased signs of depression or be deemed stable for discharge by MD. 01/07/16: Patient is in ECT treatment but appears to become more depressed and isolating in his room. 01/09/16: Patient remains depressed and will continue ECT through the beginning of next week and will look at referring patient to [facility]  2/14/20107 Pt remains depressed, isolative.  He has completed ECT and maximized benefit,  referred to [facility] today. 3. Goal(s): Patient will demonstrate decreased signs and symptoms of anxiety.  Met:No  Target date: at discharge  As evidenced by: Patient will utilize self rating of anxiety at 3 or below and demonstrated decreased signs of anxiety, or be deemed stable for discharge by MD 01/07/16: Patient is taking medications but remains in his room throughout the day 01/09/16: Patient will continue taking medications but he remains in his room and will likely be referred to [facility] next week after completing ECT 01/13/2016-remains withdrawn. Attendees: Patient:  Christian [name] 2/14/20173:04 PM  Family:   2/14/20173:04 PM  Physician:  Alethia Berthold 2/14/20173:04 PM  Nursing:   Iven Finn 2/14/20173:04 PM  Case Manager:   2/14/20173:04 PM  Counselor:  Dossie Arbour, LCSW 2/14/20173:04 PM  Other:  Everitt Amber, [place] 2/14/20173:04 PM  Other:   2/14/20173:04 PM  Other:   2/14/20173:04 PM  Other:  2/14/20173:04 PM  Other:  2/14/20173:04 PM  Other:  2/14/20173:04 PM  Other:  2/14/20173:04 PM  Other:  2/14/20173:04 PM  Other:  2/14/20173:04 PM  Other:   2/14/20173:04 PM   Scribe for Treatment Team:   August Saucer, 01/13/2016, 3:04  PM, MSW, LCSW

## 2016-01-14 ENCOUNTER — Inpatient Hospital Stay: Payer: Commercial Managed Care - HMO | Admitting: Certified Registered"

## 2016-01-14 ENCOUNTER — Inpatient Hospital Stay: Payer: Commercial Managed Care - HMO

## 2016-01-14 MED ORDER — LABETALOL HCL 5 MG/ML IV SOLN
INTRAVENOUS | Status: DC | PRN
  Administered 2016-01-14: 20 mg via INTRAVENOUS

## 2016-01-14 MED ORDER — FENTANYL CITRATE (PF) 100 MCG/2ML IJ SOLN: 25.0000 ug | INTRAMUSCULAR | Status: DC | PRN

## 2016-01-14 MED ORDER — SUCCINYLCHOLINE CHLORIDE 20 MG/ML IJ SOLN
INTRAMUSCULAR | Status: DC | PRN
  Administered 2016-01-14: 150 mg via INTRAVENOUS

## 2016-01-14 MED ORDER — KETAMINE HCL 10 MG/ML IJ SOLN
INTRAMUSCULAR | Status: DC | PRN
  Administered 2016-01-14: 120 mg via INTRAVENOUS

## 2016-01-14 MED ORDER — ONDANSETRON HCL 4 MG/2ML IJ SOLN: 4.0000 mg | Freq: Once | INTRAMUSCULAR | Status: DC | PRN

## 2016-01-14 MED ORDER — SODIUM CHLORIDE 0.9 % IV SOLN
250.0000 mL | Freq: Once | INTRAVENOUS | Status: AC
  Administered 2016-01-14: 09:00:00 via INTRAVENOUS

## 2016-01-14 NOTE — H&P (Signed)
Christian [name] is an 65 y.o. male.   Chief Complaint: Patient has no chief complaint. He continues to be withdrawn and appeared to be very depressed. Minimal activity. Not eating or drinking well. No complaints specifically regarding ECT. HPI: Patient with severe depression as part of bipolar disorder currently in the hospital after a worsening of agitated behavior and getting ECT  Past Medical History  Diagnosis Date  . Mood swings ([place])   . Type II or unspecified type diabetes mellitus with unspecified complication, uncontrolled 06/02/2014  . Other and unspecified hyperlipidemia 06/02/2014  . OSA on CPAP 06/02/2014  . 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 Bipolar disorder ([place])   . Hypertension   . Gout   . Bipolar 1 disorder ([place])   . Hx of blood clots     History reviewed. No pertinent past surgical history.  Family History  Problem Relation Age of Onset  . Dementia Mother   . Arthritis Father   . Hypertension Father   . Heart failure Neg Hx   . Kidney failure Neg Hx   . Cancer Neg Hx   . Mental illness Sister   . Diabetes Sister    Social History:  reports that he has never smoked. He does not have any smokeless tobacco history on file. He reports that he does not drink alcohol or use illicit drugs.  Allergies: No Known Allergies  Medications Prior to Admission  Medication Sig Dispense Refill  . allopurinol (ZYLOPRIM) 300 MG tablet Take 1 tablet (300 mg total) by mouth daily. 30 tablet 0  . apixaban (ELIQUIS) 5 MG TABS tablet Take 1 tablet (5 mg total) by mouth 2 (two) times daily. 60 tablet 0  . atorvastatin (LIPITOR) 10 MG tablet Take 1 tablet (10 mg total) by mouth daily at 6 PM. 30 tablet 0  . clonazePAM (KLONOPIN) 1 MG tablet Take 1 tablet (1 mg total) by mouth at bedtime. 30 tablet 0  . finasteride (PROSCAR) 5 MG tablet Take 1 tablet (5 mg total) by mouth daily. 30 tablet 0  . furosemide (LASIX) 20 MG tablet Take 20 mg by  mouth daily.     Marland Kitchen lisinopril (PRINIVIL,ZESTRIL) 40 MG tablet Take 40 mg by mouth daily.    . metFORMIN (GLUCOPHAGE-XR) 500 MG 24 hr tablet Take 500 mg by mouth 2 (two) times daily.    . mirtazapine (REMERON) 45 MG tablet Take 1 tablet (45 mg total) by mouth at bedtime. 30 tablet 0  . OLANZapine (ZYPREXA) 15 MG tablet Take 2 tablets (30 mg total) by mouth at bedtime. 60 tablet 0  . tamsulosin (FLOMAX) 0.4 MG CAPS capsule Take 1 capsule (0.4 mg total) by mouth daily after supper. 30 capsule 0  . aspirin EC 81 MG EC tablet Take 1 tablet (81 mg total) by mouth daily. (Patient not taking: Reported on 12/29/2015) 30 tablet 0  . docusate sodium (COLACE) 100 MG capsule Take 2 capsules (200 mg total) by mouth 2 (two) times daily. (Patient not taking: Reported on 12/29/2015) 60 capsule 0  . polyethylene glycol (MIRALAX / GLYCOLAX) packet Take 17 g by mouth daily. (Patient not taking: Reported on 12/29/2015) 30 each 0    Results for orders placed or performed during the hospital encounter of 12/22/15 (from the past 48 hour(s))  Creatinine, serum     Status: Abnormal   Collection Time: 01/13/16  6:44 AM  Result Value Ref Range   Creatinine, Ser 2.06 (H) 0.61 -  1.24 mg/dL   GFR calc non Af Amer 33 (L) >60 mL/min   GFR calc Af Amer 38 (L) >60 mL/min    Comment: (NOTE) The eGFR has been calculated using the CKD EPI equation. This calculation has not been validated in all clinical situations. eGFR's persistently <60 mL/min signify possible Chronic Kidney Disease.   CBC     Status: None   Collection Time: 01/13/16  6:44 AM  Result Value Ref Range   WBC 5.9 3.8 - 10.6 K/uL   RBC 5.13 4.40 - 5.90 MIL/uL   Hemoglobin 14.7 13.0 - 18.0 g/dL   HCT 45.1 40.0 - 52.0 %   MCV 88.0 80.0 - 100.0 fL   MCH 28.7 26.0 - 34.0 pg   MCHC 32.6 32.0 - 36.0 g/dL   RDW 13.4 11.5 - 14.5 %   Platelets 164 150 - 440 K/uL  Lithium level     Status: None   Collection Time: 01/13/16  6:44 AM  Result Value Ref Range   Lithium  Lvl 0.82 0.60 - 1.20 mmol/L   No results found.  Review of Systems  Constitutional: Negative.   HENT: Negative.   Eyes: Negative.   Respiratory: Negative.   Cardiovascular: Negative.   Gastrointestinal: Negative.   Musculoskeletal: Negative.   Skin: Negative.   Neurological: Negative.   Psychiatric/Behavioral: Positive for depression and memory loss. Negative for suicidal ideas, hallucinations and substance abuse. The patient is not nervous/anxious and does not have insomnia.     Blood pressure 111/67, pulse 102, temperature 96.7 F (35.9 C), temperature source Oral, resp. rate 16, height 6' (1.829 m), weight 104.327 kg (230 lb), SpO2 97 %. Physical Exam  Nursing note and vitals reviewed. Constitutional: He appears well-developed and well-nourished.  HENT:  Head: Normocephalic and atraumatic.  Eyes: Conjunctivae are normal. Pupils are equal, round, and reactive to light.  Neck: Normal range of motion.  Cardiovascular: Normal rate, regular rhythm and normal heart sounds.   Respiratory: Effort normal and breath sounds normal. No respiratory distress.  GI: Soft.  Musculoskeletal: Normal range of motion.  Neurological: He is alert.  Skin: Skin is warm and dry.  Psychiatric: His affect is blunt. His speech is delayed. He is slowed and withdrawn. He expresses inappropriate judgment. He is noncommunicative. He exhibits abnormal recent memory.     Assessment/Plan I'm concerned about his overall health I don't think he's eating and drinking well. Today I believe his treatment #11 of this series. If we are not seeing any response after at most 12 it would be appropriate to discontinue ECT.  Alethia Berthold, MD 01/14/2016, 9:15 AM

## 2016-01-14 NOTE — [facility] Group Notes (Signed)
[facility] LCSW Group Therapy  01/14/2016 2:49 PM  Patient is scheduled ECT and did not attend.  Jenel Lucks, CSW Intern 01/14/2016, 2:49 PM  Beryl Meager, MSW, LCSWA 01/15/2016, 10:24AM

## 2016-01-14 NOTE — Transfer of Care (Signed)
Immediate Anesthesia Transfer of Care Note  Patient: Christian [name]  Procedure(s) Performed: * No procedures listed *  Patient Location: PACU  Anesthesia Type:General  Level of Consciousness: awake, alert , oriented and patient cooperative  Airway & Oxygen Therapy: Patient Spontanous Breathing and Patient connected to face mask oxygen  Post-op Assessment: Report given to RN, Post -op Vital signs reviewed and stable and Patient moving all extremities X 4  Post vital signs: Reviewed and stable  Last Vitals:  Filed Vitals:   01/14/16 0703 01/14/16 0747  BP: 111/67   Pulse: 102   Temp: 36.9 C 35.9 C  Resp: 16     Complications: No apparent anesthesia complications

## 2016-01-14 NOTE — Anesthesia Preprocedure Evaluation (Signed)
Anesthesia Evaluation  Patient identified by MRN, date of birth, ID band Patient awake    Reviewed: Allergy & Precautions, NPO status , Patient's Chart, lab work & pertinent test results  Airway Mallampati: III  TM Distance: >3 FB Neck ROM: Limited    Dental  (+) Poor Dentition, Missing   Pulmonary shortness of breath and with exertion, sleep apnea and Continuous Positive Airway Pressure Ventilation ,    Pulmonary exam normal breath sounds clear to auscultation       Cardiovascular Exercise Tolerance: Poor hypertension, Pt. on medications and Pt. on home beta blockers + Peripheral Vascular Disease  Normal cardiovascular exam  On elequis.   Neuro/Psych PSYCHIATRIC DISORDERS Depression Bipolar Disorder negative neurological ROS     GI/Hepatic negative GI ROS, Neg liver ROS,   Endo/Other  diabetes, Well Controlled, Type 2BG 88.  Renal/GU negative Renal ROS  negative genitourinary   Musculoskeletal negative musculoskeletal ROS (+)   Abdominal (+) + obese,   Peds negative pediatric ROS (+)  Hematology negative hematology ROS (+)   Anesthesia Other Findings   Reproductive/Obstetrics                             Anesthesia Physical  Anesthesia Plan  ASA: III  Anesthesia Plan: General   Post-op Pain Management:    Induction: Intravenous  Airway Management Planned: Mask  Additional Equipment:   Intra-op Plan:   Post-operative Plan:   Informed Consent: I have reviewed the patients History and Physical, chart, labs and discussed the procedure including the risks, benefits and alternatives for the proposed anesthesia with the patient or authorized representative who has indicated his/her understanding and acceptance.     Plan Discussed with: CRNA  Anesthesia Plan Comments:         Anesthesia Quick Evaluation  

## 2016-01-14 NOTE — Progress Notes (Signed)
Recreation Therapy Notes  Date: 02.15.17 Time: 3:00 pm Location: Community Room  Group Topic: Self-esteem  Goal Area(s) Addresses:  Patient will write down at least one positive trait. Patient will verbalize benefit of having a healthy self-esteem.  Behavioral Response: Did not attend  Intervention: I Am  Activity: Patients were given a worksheet with the letter I and instructed to write as many positive traits about themselves inside the letter.  Education: LRT educated patients on ways they can increase self-esteem.  Education Outcome: Patient did not attend group.   Clinical Observations/Feedback: Patient did not attend group.  Jacquelynn Cree, LRT/CTRS 01/14/2016 4:01 PM

## 2016-01-14 NOTE — Progress Notes (Signed)
D: Patient denies SI/HI/AVH.  Patient affect and mood are depressed.  Patient continues to isolate in his room throughout the shift.  Interaction with staff is minimal.   Patient did not attend evening group.  No distress noted.  Patient NPO for ECT today. A: Support and encouragement offered. Scheduled medications given to pt. Q 15 min checks continued for patient safety. R: Patient receptive. Patient remains safe on the unit.

## 2016-01-14 NOTE — Progress Notes (Signed)
D: Patient escorted to and from ECT without incidence. Affect pleasant on approach. Appropriate ADL's . Appetite good . Patient walking to dayroom without issue. energy level better . Stated concentration improved less Depression Denies suicidal homicidal ideations . No auditory hallucinations No pain concerns . Appropriate ADL'S. Interacting with peers and staff.  A: Encourage patient participation with unit programming . Instruction Given on Medication , verbalize understanding. R: Voice no other concerns. Staff continue to monitor

## 2016-01-14 NOTE — Procedures (Signed)
ECT SERVICES Physician's Interval Evaluation & Treatment Note  Patient Identification: Christian [name] MRN:  [id number] Date of Evaluation:  01/14/2016 TX #: [id number]  MADRS:   MMSE:   P.E. Findings:  Blood pressure is running lower than before for blood sugars are running low. No other physical exam change.  Psychiatric Interval Note:  Really no change remains withdrawn and minimally communicative minimally active.  Subjective:  Patient is a 65 y.o. male seen for evaluation for Electroconvulsive Therapy. No specific complaint  Treatment Summary:   []   Right Unilateral             [x]  Bilateral   % Energy : 1.0 ms 100%   Impedance: 860 ohms  Seizure Energy Index: 1479 V squared  Postictal Suppression Index: 86%  Seizure Concordance Index: 96%  Medications  Pre Shock: Ketamine 120 mg, labetalol 20 mg, succinylcholine 150 mg  Post Shock:    Seizure Duration: 22 seconds by EMG, 23 seconds by EEG   Comments: Patient has had I believe 11 consecutive treatments N/A showing minimal improvement we will make a decision regarding Fridays treatment over the next day. If we are not seeing improvement at the latest after a treatment on Friday we will probably discontinue treatment.   Lungs:  [x]   Clear to auscultation               []  Other:   Heart:    [x]   Regular rhythm             []  irregular rhythm    [x]   Previous H&P reviewed, patient examined and there are NO CHANGES                 []   Previous H&P reviewed, patient examined and there are changes noted.   [name], MD 2/15/20179:18 AM

## 2016-01-14 NOTE — [facility] Group Notes (Signed)
[facility] LCSW Aftercare Discharge Planning Group Note   01/14/2016 2:30 PM  Participation Quality:  Patient is scheduled ECT today and did not attend group.   Lulu Riding, MSW, LCSWA

## 2016-01-14 NOTE — [facility] Group Notes (Signed)
[facility] Group Notes:  (Nursing/MHT/Case Management/Adjunct)  Date:  01/14/2016  Time:  1:15 PM  Type of Therapy:  Group Therapy  Participation Level:  Did Not Attend  Participation Quality: Summary of Progress/Problems:  Christian [name] 01/14/2016, 1:15 PM

## 2016-01-14 NOTE — Progress Notes (Signed)
[facility] MD Progress Note  01/14/2016 2:24 PM Jireh Elmore Salada  MRN:  [id number] Subjective:  Follow-up for Wednesday, February 15. Patient interviewed. Chart reviewed. Labs reviewed. Also discussed the case with nursing. Patient had ECT treatment #11 today. No complications. On interview this afternoon he tells me that I should tell his wife that he is not doing so well. He continues to be minimally interactive. He seemed a little bit confused at first telling me that he thought that he was on a game show or felt like he was on a game show but then turned out he was fully oriented. Overall it is been my perception that there is been little improvement. Discussion with nursing however is that nursing feels like he has clearly improved. They report that he has been more energetic and more appropriately interactive recently. This is good news given the lack otherwise of clear options. He has no other new complaints today doesn't report any actual suicidal intent. Principal Problem: Bipolar disorder, current episode depressed, severe, with psychotic features ([place]) Diagnosis:   Patient Active Problem List   Diagnosis Date Noted  . Bipolar I disorder, most recent episode depressed, severe w psychosis ([place]) [F31.5] 12/22/2015  . Bipolar affective disorder, depressed, severe, with psychotic behavior ([place]) [F31.5]   . Bipolar I disorder, current or most recent episode depressed, with psychotic features ([place]) [F31.5]   . Bipolar I disorder, most recent episode depressed ([place]) [F31.30]   . Severe bipolar I disorder with depression ([place]) [F31.4]   . Bipolar I disorder, most recent episode depressed, severe with psychotic features ([place]) [F31.5]   . Overdose [T50.901A] 09/11/2015  . Acute respiratory failure with hypoxia ([place]) [J96.01] 09/05/2015  . Elevated troponin [R79.89] 09/05/2015  . Somnolence [R40.0] 09/05/2015  . Bipolar depression ([place]) [F31.30] 09/05/2015  . Acute bilateral deep vein thrombosis (DVT) of  femoral veins (HCC) [I82.413] 09/05/2015  . Bilateral pulmonary embolism ([place]) [I26.99] 09/02/2015  . Labile hypertension [I10]   . Pulmonary embolism with acute cor pulmonale ([place]) [I26.09]   . Dyspnea [R06.00]   . Orthostatic hypotension [I95.1]   . Bipolar disorder, in partial remission, most recent episode depressed ([place]) [F31.75]   . Syncope, near [R55] 08/31/2015  . Affective psychosis, bipolar ([place]) [F31.9]   . Bipolar disorder, now depressed ([place]) [F31.30] 08/28/2015  . Bipolar I disorder, severe, current or most recent episode depressed, with psychotic features ([place]) [F31.5]   . Pressure ulcer [L89.90] 07/30/2015  . Protein-calorie malnutrition, severe ([place]) [E43] 07/30/2015  . Dehydration [E86.0] 07/29/2015  . Bipolar disorder, current episode depressed, severe, with psychotic features ([place]) [F31.5] 07/09/2015  . Diabetes ([place]) [E11.9] 07/09/2015  . UTI (urinary tract infection) [N39.0] 06/19/2014  . Positive blood culture [R78.81] 06/19/2014  . Bipolar affective disorder, current episode manic with psychotic symptoms ([place]) [F31.2] 06/17/2014  . Type II or unspecified type diabetes mellitus with unspecified complication, uncontrolled [E11.8, E11.65] 06/02/2014  . Other and unspecified hyperlipidemia [E78.5] 06/02/2014  . OSA on CPAP [G47.33] 06/02/2014  . 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 Gout [M10.9] 06/02/2014  . Hypertension [I10]    Total Time spent with patient: 30 minutes  Past Psychiatric History: Long history of bipolar disorder with history of suicide attempts and now a history of aggression  Past Medical History:  Past Medical History  Diagnosis Date  . Mood swings ([place])   . Type II or unspecified type diabetes mellitus with unspecified complication, uncontrolled 06/02/2014  . Other and  unspecified hyperlipidemia 06/02/2014  . OSA on CPAP 06/02/2014  . Bilateral lower extremity edema: chronic with venous stasis  changes 06/02/2014  . Bipolar disorder ([place])   . Hypertension   . Gout   . Bipolar 1 disorder ([place])   . Hx of blood clots    History reviewed. No pertinent past surgical history. Family History:  Family History  Problem Relation Age of Onset  . Dementia Mother   . Arthritis Father   . Hypertension Father   . Heart failure Neg Hx   . Kidney failure Neg Hx   . Cancer Neg Hx   . Mental illness Sister   . Diabetes Sister    Family Psychiatric  History: Positive for bipolar disorder and anxiety Social History:  History  Alcohol Use No     History  Drug Use No    Social History   Social History  . Marital Status: Married    Spouse Name: N/A  . Number of Children: N/A  . Years of Education: N/A   Social History Main Topics  . Smoking status: Never Smoker   . Smokeless tobacco: None  . Alcohol Use: No  . Drug Use: No  . Sexual Activity: Not Currently   Other Topics Concern  . None   Social History Narrative   *The patient has been married for over 30 years and is currently on disability. He lives with his wife in the [place] area and has one daughter.             Additional Social History:                         Sleep: Poor  Appetite:  Fair  Current Medications: Current Facility-Administered Medications  Medication Dose Route Frequency Provider Last Rate Last Dose  . acetaminophen (TYLENOL) tablet 650 mg  650 mg Oral Q6H PRN Gonzella Lex, MD   650 mg at 01/07/16 1418  . allopurinol (ZYLOPRIM) tablet 300 mg  300 mg Oral Daily Gonzella Lex, MD   300 mg at 01/14/16 1117  . alum & mag hydroxide-simeth (MAALOX/MYLANTA) 200-200-20 MG/5ML suspension 30 mL  30 mL Oral Q4H PRN Gonzella Lex, MD      . apixaban (ELIQUIS) tablet 5 mg  5 mg Oral BID Gonzella Lex, MD   5 mg at 01/14/16 1114  . aspirin EC tablet 81 mg  81 mg Oral Daily Gonzella Lex, MD   81 mg at 01/14/16 1114  . atorvastatin (LIPITOR) tablet 10 mg  10 mg Oral q1800 Gonzella Lex, MD    10 mg at 01/13/16 1706  . dextrose 5 % solution   Intravenous Continuous Gonzella Lex, MD 10 mL/hr at 01/12/16 1130    . docusate sodium (COLACE) capsule 100 mg  100 mg Oral BID Gonzella Lex, MD   100 mg at 01/14/16 1114  . fentaNYL (SUBLIMAZE) injection 25 mcg  25 mcg Intravenous Q5 min PRN Gunnar Bulla, MD      . fentaNYL (SUBLIMAZE) injection 25 mcg  25 mcg Intravenous Q5 min PRN Gijsbertus F Boston Service, MD      . fentaNYL (SUBLIMAZE) injection 25 mcg  25 mcg Intravenous Q5 min PRN Alvin Critchley, MD      . finasteride (PROSCAR) tablet 5 mg  5 mg Oral Daily Gonzella Lex, MD   5 mg at 01/14/16 1114  . furosemide (LASIX) tablet 20 mg  20 mg  Oral Daily Gonzella Lex, MD   20 mg at 01/14/16 1114  . insulin aspart (novoLOG) injection 0-9 Units  0-9 Units Subcutaneous TID WC Chauncey Mann, MD   2 Units at 01/14/16 1228  . lisinopril (PRINIVIL,ZESTRIL) tablet 40 mg  40 mg Oral Daily Clovis Fredrickson, MD   40 mg at 01/14/16 0647  . lithium carbonate (ESKALITH) CR tablet 450 mg  450 mg Oral QHS Gonzella Lex, MD   450 mg at 01/13/16 2206  . magnesium hydroxide (MILK OF MAGNESIA) suspension 30 mL  30 mL Oral Daily PRN Gonzella Lex, MD   30 mL at 01/08/16 2315  . metFORMIN (GLUCOPHAGE) tablet 500 mg  500 mg Oral BID WC Gonzella Lex, MD   500 mg at 01/14/16 1116  . metoprolol tartrate (LOPRESSOR) tablet 12.5 mg  12.5 mg Oral BID Gonzella Lex, MD   12.5 mg at 01/14/16 0647  . mirtazapine (REMERON) tablet 45 mg  45 mg Oral QHS Gonzella Lex, MD   45 mg at 01/13/16 2206  . ondansetron (ZOFRAN) injection 4 mg  4 mg Intravenous Once PRN Gunnar Bulla, MD      . ondansetron [facility]) injection 4 mg  4 mg Intravenous Once PRN Gijsbertus Lonia Mad, MD      . ondansetron [facility]) injection 4 mg  4 mg Intravenous Once PRN Alvin Critchley, MD      . ondansetron (ZOFRAN-ODT) disintegrating tablet 4 mg  4 mg Oral Q8H PRN Gonzella Lex, MD   4 mg at 01/09/16 1340  . polyethylene glycol (MIRALAX /  GLYCOLAX) packet 17 g  17 g Oral Daily Gonzella Lex, MD   17 g at 01/14/16 1113  . QUEtiapine (SEROQUEL) tablet 400 mg  400 mg Oral QHS Gonzella Lex, MD   400 mg at 01/13/16 2205  . tamsulosin (FLOMAX) capsule 0.4 mg  0.4 mg Oral QPC supper Gonzella Lex, MD   0.4 mg at 01/13/16 1705    Lab Results:  Results for orders placed or performed during the hospital encounter of 12/22/15 (from the past 48 hour(s))  Creatinine, serum     Status: Abnormal   Collection Time: 01/13/16  6:44 AM  Result Value Ref Range   Creatinine, Ser 2.06 (H) 0.61 - 1.24 mg/dL   GFR calc non Af Amer 33 (L) >60 mL/min   GFR calc Af Amer 38 (L) >60 mL/min    Comment: (NOTE) The eGFR has been calculated using the CKD EPI equation. This calculation has not been validated in all clinical situations. eGFR's persistently <60 mL/min signify possible Chronic Kidney Disease.   CBC     Status: None   Collection Time: 01/13/16  6:44 AM  Result Value Ref Range   WBC 5.9 3.8 - 10.6 K/uL   RBC 5.13 4.40 - 5.90 MIL/uL   Hemoglobin 14.7 13.0 - 18.0 g/dL   HCT 45.1 40.0 - 52.0 %   MCV 88.0 80.0 - 100.0 fL   MCH 28.7 26.0 - 34.0 pg   MCHC 32.6 32.0 - 36.0 g/dL   RDW 13.4 11.5 - 14.5 %   Platelets 164 150 - 440 K/uL  Lithium level     Status: None   Collection Time: 01/13/16  6:44 AM  Result Value Ref Range   Lithium Lvl 0.82 0.60 - 1.20 mmol/L    Physical Findings: AIMS: Facial and Oral Movements Muscles of Facial Expression: None, normal Lips and Perioral Area:  None, normal Jaw: None, normal Tongue: None, normal,Extremity Movements Upper (arms, wrists, hands, fingers): None, normal Lower (legs, knees, ankles, toes): None, normal, Trunk Movements Neck, shoulders, hips: None, normal, Overall Severity Severity of abnormal movements (highest score from questions above): None, normal Incapacitation due to abnormal movements: None, normal Patient's awareness of abnormal movements (rate only patient's report): No  Awareness, Dental Status Current problems with teeth and/or dentures?: No Does patient usually wear dentures?: No  CIWA:    COWS:     Musculoskeletal: Strength & Muscle Tone: decreased Gait & Station: shuffle Patient leans: N/A  Psychiatric Specialty Exam: Review of Systems  HENT: Negative.   Eyes: Negative.   Respiratory: Negative.   Cardiovascular: Negative.   Gastrointestinal: Negative.   Musculoskeletal: Negative.   Skin: Negative.   Neurological: Positive for weakness.  Psychiatric/Behavioral: Positive for depression and memory loss. Negative for suicidal ideas, hallucinations and substance abuse. The patient is nervous/anxious and has insomnia.     Blood pressure 140/88, pulse 101, temperature 99 F (37.2 C), temperature source Oral, resp. rate 21, height 6' (1.829 m), weight 104.327 kg (230 lb), SpO2 95 %.Body mass index is 31.19 kg/(m^2).  General Appearance: Casual  Eye Contact::  Fair  Speech:  Slow  Volume:  Decreased  Mood:  Dysphoric  Affect:  Constricted  Thought Process:  Tangential  Orientation:  Full (Time, Place, and Person)  Thought Content:  Rumination  Suicidal Thoughts:  No  Homicidal Thoughts:  No  Memory:  Immediate;   Fair Recent;   Fair Remote;   Fair  Judgement:  Poor  Insight:  Fair  Psychomotor Activity:  Psychomotor Retardation  Concentration:  Fair  Recall:  [employer] of Knowledge:Fair  Language: Fair  Akathisia:  No  Handed:  Right  AIMS (if indicated):     Assets:  Desire for Improvement Resilience Social Support  ADL's:  Impaired  Cognition: Impaired,  Mild  Sleep:  Number of Hours: 8   Treatment Plan Summary: Patient did receive his 11th ECT treatment today. As I mentioned previously it took a great many treatments to show improvement last time he was in the hospital so the feedback I get from nursing today is inspiring that perhaps we may be starting to show some improvement. We will continue with the plan to have treatment  #12 on Friday. Continue his current antidepressant and mood stabilizing medicine although as mentioned before I was concerned about his creatinine and lithium. We will recheck his basic metabolic panel and lithium level tomorrow morning. Patient once again encouraged to try to get up out of bed. To my pleasure he got up out of bed afterwards and attended a group. I discussed with the treatment team and length of stay committee the possibility of referral to [facility] and I still think that's probably a long-term likelihood given his wife's discomfort with him returning home. Alethia Berthold, MD 01/14/2016, 2:24 PM

## 2016-01-14 NOTE — Anesthesia Postprocedure Evaluation (Signed)
Anesthesia Post Note  Patient: Christian [name]  Procedure(s) Performed: * No procedures listed *  Patient location during evaluation: PACU Anesthesia Type: General Level of consciousness: awake and alert and oriented Pain management: pain level controlled Vital Signs Assessment: post-procedure vital signs reviewed and stable Respiratory status: spontaneous breathing Cardiovascular status: blood pressure returned to baseline Anesthetic complications: no    Last Vitals:  Filed Vitals:   01/14/16 0950 01/14/16 1000  BP: 133/86 140/88  Pulse: 101 101  Temp:  37.2 C  Resp: 18 21    Last Pain:  Filed Vitals:   01/14/16 1001  PainSc: 0-No pain                 Bernhard Koskinen

## 2016-01-15 LAB — BASIC METABOLIC PANEL
Anion gap: 6 (ref 5–15)
BUN: 48 mg/dL — ABNORMAL HIGH (ref 6–20)
CO2: 27 mmol/L (ref 22–32)
Calcium: 9.6 mg/dL (ref 8.9–10.3)
Chloride: 101 mmol/L (ref 101–111)
Creatinine, Ser: 1.41 mg/dL — ABNORMAL HIGH (ref 0.61–1.24)
GFR calc Af Amer: >60 mL/min (ref >60)
GFR calc non Af Amer: 52 mL/min — ABNORMAL LOW (ref >60)
Glucose, Bld: 95 mg/dL (ref 65–99)
Potassium: 4.2 mmol/L (ref 3.5–5.1)
Sodium: 134 mmol/L — ABNORMAL LOW (ref 135–145)

## 2016-01-15 LAB — LITHIUM LEVEL: Lithium Lvl: 0.94 mmol/L (ref 0.60–1.20)

## 2016-01-15 NOTE — Progress Notes (Signed)
[facility] MD Progress Note  01/15/2016 6:58 PM Christian [name]  MRN:  [id number] Subjective: Follow-up for Thursday, February 16. Saw the patient and also got to speak with his wife briefly. Patient was up out of bed interacting with his wife fairly appropriately. He has a little bit of a tremor today but no other new complaint. He says he's been eating okay and has gotten up out of bed during the day. No new physical complaints. Still very withdrawn minimal speech and interaction.  Principal Problem: Bipolar disorder, current episode depressed, severe, with psychotic features ([place]) Diagnosis:   Patient Active Problem List   Diagnosis Date Noted  . Bipolar I disorder, most recent episode depressed, severe w psychosis ([place]) [F31.5] 12/22/2015  . Bipolar affective disorder, depressed, severe, with psychotic behavior ([place]) [F31.5]   . Bipolar I disorder, current or most recent episode depressed, with psychotic features ([place]) [F31.5]   . Bipolar I disorder, most recent episode depressed ([place]) [F31.30]   . Severe bipolar I disorder with depression ([place]) [F31.4]   . Bipolar I disorder, most recent episode depressed, severe with psychotic features ([place]) [F31.5]   . Overdose [T50.901A] 09/11/2015  . Acute respiratory failure with hypoxia ([place]) [J96.01] 09/05/2015  . Elevated troponin [R79.89] 09/05/2015  . Somnolence [R40.0] 09/05/2015  . Bipolar depression ([place]) [F31.30] 09/05/2015  . Acute bilateral deep vein thrombosis (DVT) of femoral veins (HCC) [I82.413] 09/05/2015  . Bilateral pulmonary embolism ([place]) [I26.99] 09/02/2015  . Labile hypertension [I10]   . Pulmonary embolism with acute cor pulmonale ([place]) [I26.09]   . Dyspnea [R06.00]   . Orthostatic hypotension [I95.1]   . Bipolar disorder, in partial remission, most recent episode depressed ([place]) [F31.75]   . Syncope, near [R55] 08/31/2015  . Affective psychosis, bipolar ([place]) [F31.9]   . Bipolar disorder, now depressed ([place]) [F31.30] 08/28/2015   . Bipolar I disorder, severe, current or most recent episode depressed, with psychotic features ([place]) [F31.5]   . Pressure ulcer [L89.90] 07/30/2015  . Protein-calorie malnutrition, severe ([place]) [E43] 07/30/2015  . Dehydration [E86.0] 07/29/2015  . Bipolar disorder, current episode depressed, severe, with psychotic features ([place]) [F31.5] 07/09/2015  . Diabetes ([place]) [E11.9] 07/09/2015  . UTI (urinary tract infection) [N39.0] 06/19/2014  . Positive blood culture [R78.81] 06/19/2014  . Bipolar affective disorder, current episode manic with psychotic symptoms ([place]) [F31.2] 06/17/2014  . Type II or unspecified type diabetes mellitus with unspecified complication, uncontrolled [E11.8, E11.65] 06/02/2014  . Other and unspecified hyperlipidemia [E78.5] 06/02/2014  . OSA on CPAP [G47.33] 06/02/2014  . 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 Gout [M10.9] 06/02/2014  . Hypertension [I10]    Total Time spent with patient: 30 minutes  Past Psychiatric History: Long history of bipolar disorder with history of suicide attempts and now a history of aggression  Past Medical History:  Past Medical History  Diagnosis Date  . Mood swings ([place])   . Type II or unspecified type diabetes mellitus with unspecified complication, uncontrolled 06/02/2014  . Other and unspecified hyperlipidemia 06/02/2014  . OSA on CPAP 06/02/2014  . 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 Bipolar disorder ([place])   . Hypertension   . Gout   . Bipolar 1 disorder ([place])   . Hx of blood clots    History reviewed. No pertinent past surgical history. Family History:  Family History  Problem Relation Age of Onset  . Dementia Mother   . Arthritis Father   .  Hypertension Father   . Heart failure Neg Hx   . Kidney failure Neg Hx   . Cancer Neg Hx   . Mental illness Sister   . Diabetes Sister    Family Psychiatric  History: Positive for  bipolar disorder and anxiety Social History:  History  Alcohol Use No     History  Drug Use No    Social History   Social History  . Marital Status: Married    Spouse Name: N/A  . Number of Children: N/A  . Years of Education: N/A   Social History Main Topics  . Smoking status: Never Smoker   . Smokeless tobacco: None  . Alcohol Use: No  . Drug Use: No  . Sexual Activity: Not Currently   Other Topics Concern  . None   Social History Narrative   *The patient has been married for over 30 years and is currently on disability. He lives with his wife in the [place] area and has one daughter.             Additional Social History:                         Sleep: Poor  Appetite:  Fair  Current Medications: Current Facility-Administered Medications  Medication Dose Route Frequency Provider Last Rate Last Dose  . acetaminophen (TYLENOL) tablet 650 mg  650 mg Oral Q6H PRN Gonzella Lex, MD   650 mg at 01/07/16 1418  . allopurinol (ZYLOPRIM) tablet 300 mg  300 mg Oral Daily Gonzella Lex, MD   300 mg at 01/15/16 1012  . alum & mag hydroxide-simeth (MAALOX/MYLANTA) 200-200-20 MG/5ML suspension 30 mL  30 mL Oral Q4H PRN Gonzella Lex, MD      . apixaban (ELIQUIS) tablet 5 mg  5 mg Oral BID Gonzella Lex, MD   5 mg at 01/15/16 1014  . aspirin EC tablet 81 mg  81 mg Oral Daily Gonzella Lex, MD   81 mg at 01/15/16 1014  . atorvastatin (LIPITOR) tablet 10 mg  10 mg Oral q1800 Gonzella Lex, MD   10 mg at 01/15/16 1726  . dextrose 5 % solution   Intravenous Continuous Gonzella Lex, MD 10 mL/hr at 01/12/16 1130    . docusate sodium (COLACE) capsule 100 mg  100 mg Oral BID Gonzella Lex, MD   100 mg at 01/15/16 1016  . fentaNYL (SUBLIMAZE) injection 25 mcg  25 mcg Intravenous Q5 min PRN Gunnar Bulla, MD      . fentaNYL (SUBLIMAZE) injection 25 mcg  25 mcg Intravenous Q5 min PRN Gijsbertus F Boston Service, MD      . fentaNYL (SUBLIMAZE) injection 25 mcg  25 mcg  Intravenous Q5 min PRN Alvin Critchley, MD      . finasteride (PROSCAR) tablet 5 mg  5 mg Oral Daily Gonzella Lex, MD   5 mg at 01/15/16 1020  . furosemide (LASIX) tablet 20 mg  20 mg Oral Daily Gonzella Lex, MD   20 mg at 01/15/16 1021  . insulin aspart (novoLOG) injection 0-9 Units  0-9 Units Subcutaneous TID WC Chauncey Mann, MD   1 Units at 01/15/16 [phone]  . lisinopril (PRINIVIL,ZESTRIL) tablet 40 mg  40 mg Oral Daily Jolanta B Pucilowska, MD   40 mg at 01/15/16 1021  . lithium carbonate (ESKALITH) CR tablet 450 mg  450 mg Oral QHS Gonzella Lex, MD  450 mg at 01/14/16 2240  . magnesium hydroxide (MILK OF MAGNESIA) suspension 30 mL  30 mL Oral Daily PRN Gonzella Lex, MD   30 mL at 01/08/16 2315  . metFORMIN (GLUCOPHAGE) tablet 500 mg  500 mg Oral BID WC Gonzella Lex, MD   500 mg at 01/15/16 1742  . metoprolol tartrate (LOPRESSOR) tablet 12.5 mg  12.5 mg Oral BID Gonzella Lex, MD   12.5 mg at 01/15/16 1023  . mirtazapine (REMERON) tablet 45 mg  45 mg Oral QHS Gonzella Lex, MD   45 mg at 01/14/16 2240  . ondansetron (ZOFRAN) injection 4 mg  4 mg Intravenous Once PRN Gunnar Bulla, MD      . ondansetron [facility]) injection 4 mg  4 mg Intravenous Once PRN Gijsbertus Lonia Mad, MD      . ondansetron [facility]) injection 4 mg  4 mg Intravenous Once PRN Alvin Critchley, MD      . ondansetron (ZOFRAN-ODT) disintegrating tablet 4 mg  4 mg Oral Q8H PRN Gonzella Lex, MD   4 mg at 01/09/16 1340  . polyethylene glycol (MIRALAX / GLYCOLAX) packet 17 g  17 g Oral Daily Gonzella Lex, MD   17 g at 01/15/16 1026  . QUEtiapine (SEROQUEL) tablet 400 mg  400 mg Oral QHS Gonzella Lex, MD   400 mg at 01/14/16 2240  . tamsulosin (FLOMAX) capsule 0.4 mg  0.4 mg Oral QPC supper Gonzella Lex, MD   0.4 mg at 01/15/16 1753    Lab Results:  Results for orders placed or performed during the hospital encounter of 12/22/15 (from the past 48 hour(s))  Basic metabolic panel     Status: Abnormal   Collection  Time: 01/15/16  7:07 AM  Result Value Ref Range   Sodium 134 (L) 135 - 145 mmol/L   Potassium 4.2 3.5 - 5.1 mmol/L   Chloride 101 101 - 111 mmol/L   CO2 27 22 - 32 mmol/L   Glucose, Bld 95 65 - 99 mg/dL   BUN 48 (H) 6 - 20 mg/dL   Creatinine, Ser 1.41 (H) 0.61 - 1.24 mg/dL   Calcium 9.6 8.9 - 10.3 mg/dL   GFR calc non Af Amer 52 (L) >60 mL/min   GFR calc Af Amer >60 >60 mL/min    Comment: (NOTE) The eGFR has been calculated using the CKD EPI equation. This calculation has not been validated in all clinical situations. eGFR's persistently <60 mL/min signify possible Chronic Kidney Disease.    Anion gap 6 5 - 15  Lithium level     Status: None   Collection Time: 01/15/16  7:07 AM  Result Value Ref Range   Lithium Lvl 0.94 0.60 - 1.20 mmol/L    Physical Findings: AIMS: Facial and Oral Movements Muscles of Facial Expression: None, normal Lips and Perioral Area: None, normal Jaw: None, normal Tongue: None, normal,Extremity Movements Upper (arms, wrists, hands, fingers): None, normal Lower (legs, knees, ankles, toes): None, normal, Trunk Movements Neck, shoulders, hips: None, normal, Overall Severity Severity of abnormal movements (highest score from questions above): None, normal Incapacitation due to abnormal movements: None, normal Patient's awareness of abnormal movements (rate only patient's report): No Awareness, Dental Status Current problems with teeth and/or dentures?: No Does patient usually wear dentures?: No  CIWA:    COWS:     Musculoskeletal: Strength & Muscle Tone: decreased Gait & Station: shuffle Patient leans: N/A  Psychiatric Specialty Exam: Review of Systems  HENT: Negative.   Eyes: Negative.   Respiratory: Negative.   Cardiovascular: Negative.   Gastrointestinal: Negative.   Musculoskeletal: Negative.   Skin: Negative.   Neurological: Positive for weakness.  Psychiatric/Behavioral: Positive for depression and memory loss. Negative for suicidal  ideas, hallucinations and substance abuse. The patient is nervous/anxious and has insomnia.     Blood pressure 136/80, pulse 88, temperature 98 F (36.7 C), temperature source Oral, resp. rate 20, height 6' (1.829 m), weight 104.327 kg (230 lb), SpO2 100 %.Body mass index is 31.19 kg/(m^2).  General Appearance: Casual  Eye Contact::  Fair  Speech:  Slow  Volume:  Decreased  Mood:  Dysphoric  Affect:  Constricted  Thought Process:  Tangential  Orientation:  Full (Time, Place, and Person)  Thought Content:  Rumination  Suicidal Thoughts:  No  Homicidal Thoughts:  No  Memory:  Immediate;   Fair Recent;   Fair Remote;   Fair  Judgement:  Poor  Insight:  Fair  Psychomotor Activity:  Psychomotor Retardation  Concentration:  Fair  Recall:  [employer] of Knowledge:Fair  Language: Fair  Akathisia:  No  Handed:  Right  AIMS (if indicated):     Assets:  Desire for Improvement Resilience Social Support  ADL's:  Impaired  Cognition: Impaired,  Mild  Sleep:  Number of Hours: 6   Treatment Plan Summary: Patient will receive ECT treatment 12 tomorrow. Reminded patient and his wife how many treatments it took for him to get better last time. No change to medicine for now. I checked his creatinine this morning and it is back into the range that is more normal for him. Also his lithium is about 0.9 which is finds why don't feel the need to change any of that. Supportive counseling and review of treatment plan with both of them as well as the potential for discharge planning. Alethia Berthold, MD 01/15/2016, 6:58 PM

## 2016-01-15 NOTE — Plan of Care (Signed)
Problem: Alteration in mood Goal: LTG-Patient reports reduction in suicidal thoughts (Patient reports reduction in suicidal thoughts and is able to verbalize a safety plan for whenever patient is feeling suicidal)  Outcome: Progressing Pt denies SI/HI.

## 2016-01-15 NOTE — Progress Notes (Signed)
Recreation Therapy Notes  Date: 02.16.17 Time: 3:00 pm Location: Community Room  Group Topic: Coping Skills/Leisure Education  Goal Area(s) Addresses:  Patient will identify things they are grateful for. Patient will identify how being grateful can influence decision making.  Behavioral Response: Did not attend  Intervention: Grateful Wheel  Activity: Patients were given an "I Am Grateful For" worksheet and instructed to write 2-3 things they were grateful for under each category.  Education: LRT educated patients on why it is important to think of things they are grateful for.  Education Outcome: Patient did not attend group.   Clinical Observations/Feedback: Patient did not attend group.  Jacquelynn Cree, LRT/CTRS 01/15/2016 2:46 PM

## 2016-01-15 NOTE — Progress Notes (Signed)
D: Remains to isolate himself from peers Patient stated slept poor last night .Stated appetite is poor and energy level  Is normal. Stated concentration is good . Stated on Depression scale 8 , hopeless 8 and anxiety 5 .( low 0-10 high) Denies suicidal  homicidal ideations  .  No auditory hallucinations  No pain concerns . Appropriate ADL'S. Interacting with peers and staff. Patient  Limited insight  . Information concrete.  Patient is not a good historian. A: Encourage patient participation with unit programming . Instruction  Given on  Medication , verbalize understanding. R: Voice no other concerns. Staff continue to monitor

## 2016-01-15 NOTE — Plan of Care (Signed)
Problem: Alteration in mood Goal: LTG-Pt's behavior demonstrates decreased signs of depression (Patient's behavior demonstrates decreased signs of depression to the point the patient is safe to return home and continue treatment in an outpatient setting)  Outcome: Progressing Pt affect is brighter this evening. Pt rates depression as a 5 out of 10

## 2016-01-15 NOTE — Progress Notes (Signed)
D: Observed pt walking around unit. Patient alert and oriented x4. Patient denies SI/HI/AVH. Pt affect is flat. Pt talked about seeing wife, and how she doesn't want him to come back home to her. Pt rated depression 5/10 and anxiety 5/10, but still indicates that he has no improvement in his mood or affect. A: Offered active listening and support. Provided therapeutic communication. Administered scheduled medications.  R: Pt pleasant and cooperative. Pt medication compliant. Will continue Q15 min. checks. Safety maintained.

## 2016-01-15 NOTE — Anesthesia Postprocedure Evaluation (Signed)
Anesthesia Post Note  Patient: Christian [name]  Procedure(s) Performed: * No procedures listed *  Patient location during evaluation: PACU Anesthesia Type: General Level of consciousness: awake and alert Pain management: pain level controlled Vital Signs Assessment: post-procedure vital signs reviewed and stable Respiratory status: spontaneous breathing, nonlabored ventilation, respiratory function stable and patient connected to nasal cannula oxygen Cardiovascular status: blood pressure returned to baseline and stable Postop Assessment: no signs of nausea or vomiting Anesthetic complications: no    Last Vitals:  Filed Vitals:   01/14/16 2155 01/15/16 0718  BP: 134/89 136/80  Pulse: 100 88  Temp: 37 C 36.7 C  Resp: 20 20    Last Pain:  Filed Vitals:   01/15/16 0719  PainSc: 0-No pain                 Yevette Edwards

## 2016-01-15 NOTE — Plan of Care (Signed)
Problem: Alteration in mood Goal: LTG-Patient reports reduction in suicidal thoughts (Patient reports reduction in suicidal thoughts and is able to verbalize a safety plan for whenever patient is feeling suicidal)  Outcome: Progressing Denies suicidal ideations     

## 2016-01-15 NOTE — [facility] Group Notes (Signed)
[facility] Group Notes:  (Nursing/MHT/Case Management/Adjunct)  Date:  01/15/2016  Time:  2:36 PM  Type of Therapy:  Group Therapy  Participation Level:  Did Not Attend  Christian [name] Christian [name] Christian [name] 01/15/2016, 2:36 PM

## 2016-01-16 ENCOUNTER — Inpatient Hospital Stay: Payer: Commercial Managed Care - HMO | Admitting: Anesthesiology

## 2016-01-16 ENCOUNTER — Inpatient Hospital Stay: Payer: Commercial Managed Care - HMO

## 2016-01-16 LAB — CBC
HCT: 41.5 % (ref 40.0–52.0)
Hemoglobin: 13.8 g/dL (ref 13.0–18.0)
MCH: 29.7 pg (ref 26.0–34.0)
MCHC: 33.3 g/dL (ref 32.0–36.0)
MCV: 89.1 fL (ref 80.0–100.0)
Platelets: 152 10*3/uL (ref 150–440)
RBC: 4.66 MIL/uL (ref 4.40–5.90)
RDW: 13.3 % (ref 11.5–14.5)
WBC: 4.5 10*3/uL (ref 3.8–10.6)

## 2016-01-16 LAB — CREATININE, SERUM
Creatinine, Ser: 1.63 mg/dL — ABNORMAL HIGH (ref 0.61–1.24)
GFR calc Af Amer: 51 mL/min — ABNORMAL LOW (ref >60)
GFR calc non Af Amer: 44 mL/min — ABNORMAL LOW (ref >60)

## 2016-01-16 MED ORDER — DEXTROSE 5 % IV SOLN
250.0000 mL | Freq: Once | INTRAVENOUS | Status: AC
  Administered 2016-01-16: 250 mL via INTRAVENOUS

## 2016-01-16 MED ORDER — SUCCINYLCHOLINE CHLORIDE 20 MG/ML IJ SOLN
INTRAMUSCULAR | Status: DC | PRN
  Administered 2016-01-16: 150 mg via INTRAVENOUS

## 2016-01-16 MED ORDER — ONDANSETRON 4 MG PO TBDP
4.0000 mg | ORAL_TABLET | Freq: Three times a day (TID) | ORAL | Status: DC | PRN
  Administered 2016-01-16: 4 mg via ORAL

## 2016-01-16 MED ORDER — PANTOPRAZOLE SODIUM 40 MG PO TBEC
40.0000 mg | DELAYED_RELEASE_TABLET | Freq: Every day | ORAL | Status: DC
  Administered 2016-01-16 – 2016-02-03 (×19): 40 mg via ORAL
  Filled 2016-01-16 (×19): qty 1

## 2016-01-16 MED ORDER — LABETALOL HCL 5 MG/ML IV SOLN
INTRAVENOUS | Status: DC | PRN
  Administered 2016-01-16: 20 mg via INTRAVENOUS

## 2016-01-16 MED ORDER — SODIUM CHLORIDE 0.9 % IV SOLN
250.0000 mL | Freq: Once | INTRAVENOUS | Status: AC
  Administered 2016-01-16: 11:00:00 via INTRAVENOUS

## 2016-01-16 MED ORDER — KETAMINE HCL 10 MG/ML IJ SOLN
INTRAMUSCULAR | Status: DC | PRN
  Administered 2016-01-16: 120 mg via INTRAVENOUS

## 2016-01-16 NOTE — Progress Notes (Signed)
Recreation Therapy Notes  Date: 02.17.17 Time: 3:00 pm Location: Community Room  Group Topic: Self-expression/Coping Skills  Goal Area(s) Addresses:  Patient will effectively use art as a Associate Professor. Patient will recognize positive benefit of coping skills. Patient will be able to identify one emotion experienced during group session. Patient will identify use of art as a coping skill.  Behavioral Response: Did not attend  Intervention: Two Faces of Me  Activity: Patients were given a blank face worksheet and instructed to draw or write how they felt when they were admitted into the hospital and draw or write how they want to feel when they are d/c.  Education: LRT educated patients on healthy coping skills.   Education Outcome: Patient did not attend group.  Clinical Observations/Feedback: Patient did not attend group.  Jacquelynn Cree, LRT/CTRS 01/16/2016 3:47 PM

## 2016-01-16 NOTE — [facility] Group Notes (Signed)
[facility] Group Notes:  (Nursing/MHT/Case Management/Adjunct)  Date:  01/16/2016  Time:  4:34 PM  Type of Therapy:  Psychoeducational Skills  Participation Level:  Did Not Attend  Marquette Old 01/16/2016, 4:34 PM

## 2016-01-16 NOTE — Plan of Care (Signed)
Problem: Alteration in mood Goal: STG-Patient is able to discuss feelings and issues (Patient is able to discuss feelings and issues leading to depression)  Outcome: Progressing Patient reported that he felt more "disoriented" today

## 2016-01-16 NOTE — H&P (Signed)
Christian [name] is an 65 y.o. male.   Chief Complaint: Patient has no specific new complaints. Blood pressure was low this morning and we had difficulty getting an IV because he is probably dehydrated. Also he has been having a tremor over the last day possibly from low blood sugar or else the lithium. Mood unchanged HPI: Patient with severe bipolar disorder currently depressed with psychotic features in the hospital receiving ECT treatment so far with questionable improvement  Past Medical History  Diagnosis Date  . Mood swings ([place])   . Type II or unspecified type diabetes mellitus with unspecified complication, uncontrolled 06/02/2014  . Other and unspecified hyperlipidemia 06/02/2014  . OSA on CPAP 06/02/2014  . 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 Bipolar disorder ([place])   . Hypertension   . Gout   . Bipolar 1 disorder ([place])   . Hx of blood clots     History reviewed. No pertinent past surgical history.  Family History  Problem Relation Age of Onset  . Dementia Mother   . Arthritis Father   . Hypertension Father   . Heart failure Neg Hx   . Kidney failure Neg Hx   . Cancer Neg Hx   . Mental illness Sister   . Diabetes Sister    Social History:  reports that he has never smoked. He does not have any smokeless tobacco history on file. He reports that he does not drink alcohol or use illicit drugs.  Allergies: No Known Allergies  Medications Prior to Admission  Medication Sig Dispense Refill  . allopurinol (ZYLOPRIM) 300 MG tablet Take 1 tablet (300 mg total) by mouth daily. 30 tablet 0  . apixaban (ELIQUIS) 5 MG TABS tablet Take 1 tablet (5 mg total) by mouth 2 (two) times daily. 60 tablet 0  . atorvastatin (LIPITOR) 10 MG tablet Take 1 tablet (10 mg total) by mouth daily at 6 PM. 30 tablet 0  . clonazePAM (KLONOPIN) 1 MG tablet Take 1 tablet (1 mg total) by mouth at bedtime. 30 tablet 0  . finasteride (PROSCAR) 5 MG tablet Take 1  tablet (5 mg total) by mouth daily. 30 tablet 0  . furosemide (LASIX) 20 MG tablet Take 20 mg by mouth daily.     Marland Kitchen lisinopril (PRINIVIL,ZESTRIL) 40 MG tablet Take 40 mg by mouth daily.    . metFORMIN (GLUCOPHAGE-XR) 500 MG 24 hr tablet Take 500 mg by mouth 2 (two) times daily.    . mirtazapine (REMERON) 45 MG tablet Take 1 tablet (45 mg total) by mouth at bedtime. 30 tablet 0  . OLANZapine (ZYPREXA) 15 MG tablet Take 2 tablets (30 mg total) by mouth at bedtime. 60 tablet 0  . tamsulosin (FLOMAX) 0.4 MG CAPS capsule Take 1 capsule (0.4 mg total) by mouth daily after supper. 30 capsule 0  . aspirin EC 81 MG EC tablet Take 1 tablet (81 mg total) by mouth daily. (Patient not taking: Reported on 12/29/2015) 30 tablet 0  . docusate sodium (COLACE) 100 MG capsule Take 2 capsules (200 mg total) by mouth 2 (two) times daily. (Patient not taking: Reported on 12/29/2015) 60 capsule 0  . polyethylene glycol (MIRALAX / GLYCOLAX) packet Take 17 g by mouth daily. (Patient not taking: Reported on 12/29/2015) 30 each 0    Results for orders placed or performed during the hospital encounter of 12/22/15 (from the past 48 hour(s))  Basic metabolic panel     Status: Abnormal  Collection Time: 01/15/16  7:07 AM  Result Value Ref Range   Sodium 134 (L) 135 - 145 mmol/L   Potassium 4.2 3.5 - 5.1 mmol/L   Chloride 101 101 - 111 mmol/L   CO2 27 22 - 32 mmol/L   Glucose, Bld 95 65 - 99 mg/dL   BUN 48 (H) 6 - 20 mg/dL   Creatinine, Ser 1.41 (H) 0.61 - 1.24 mg/dL   Calcium 9.6 8.9 - 10.3 mg/dL   GFR calc non Af Amer 52 (L) >60 mL/min   GFR calc Af Amer >60 >60 mL/min    Comment: (NOTE) The eGFR has been calculated using the CKD EPI equation. This calculation has not been validated in all clinical situations. eGFR's persistently <60 mL/min signify possible Chronic Kidney Disease.    Anion gap 6 5 - 15  Lithium level     Status: None   Collection Time: 01/15/16  7:07 AM  Result Value Ref Range   Lithium Lvl 0.94  0.60 - 1.20 mmol/L  Creatinine, serum     Status: Abnormal   Collection Time: 01/16/16  6:55 AM  Result Value Ref Range   Creatinine, Ser 1.63 (H) 0.61 - 1.24 mg/dL   GFR calc non Af Amer 44 (L) >60 mL/min   GFR calc Af Amer 51 (L) >60 mL/min    Comment: (NOTE) The eGFR has been calculated using the CKD EPI equation. This calculation has not been validated in all clinical situations. eGFR's persistently <60 mL/min signify possible Chronic Kidney Disease.   CBC     Status: None   Collection Time: 01/16/16  6:55 AM  Result Value Ref Range   WBC 4.5 3.8 - 10.6 K/uL   RBC 4.66 4.40 - 5.90 MIL/uL   Hemoglobin 13.8 13.0 - 18.0 g/dL   HCT 41.5 40.0 - 52.0 %   MCV 89.1 80.0 - 100.0 fL   MCH 29.7 26.0 - 34.0 pg   MCHC 33.3 32.0 - 36.0 g/dL   RDW 13.3 11.5 - 14.5 %   Platelets 152 150 - 440 K/uL   No results found.  Review of Systems  Constitutional: Negative.   HENT: Negative.   Eyes: Negative.   Respiratory: Negative.   Cardiovascular: Negative.   Gastrointestinal: Negative.   Musculoskeletal: Negative.   Skin: Negative.   Neurological: Negative.   Psychiatric/Behavioral: Positive for depression and memory loss. Negative for suicidal ideas, hallucinations and substance abuse. The patient is nervous/anxious and has insomnia.     Blood pressure 107/79, pulse 81, temperature 96.7 F (35.9 C), temperature source Oral, resp. rate 16, height 6' (1.829 m), weight 102.967 kg (227 lb), SpO2 100 %. Physical Exam  Nursing note and vitals reviewed. Constitutional: He appears well-developed and well-nourished.  HENT:  Head: Normocephalic and atraumatic.  Eyes: Conjunctivae are normal. Pupils are equal, round, and reactive to light.  Neck: Normal range of motion.  Cardiovascular: Normal rate, regular rhythm and normal heart sounds.   Respiratory: Effort normal and breath sounds normal. No respiratory distress.  GI: Soft.  Musculoskeletal: Normal range of motion.  Neurological: He is  alert.  Skin: Skin is warm and dry.  Psychiatric: His affect is blunt. His speech is delayed. He is slowed and withdrawn. He expresses inappropriate judgment. He exhibits a depressed mood. He is noncommunicative. He exhibits abnormal recent memory.     Assessment/Plan Today his treatment #12 and we are going to reassess after this to decide as to whether further treatments would be of any  benefit. Reevaluate over the weekend no change otherwise except I'm going to hold the lithium to try and treat the tremor.  Alethia Berthold, MD 01/16/2016, 10:52 AM

## 2016-01-16 NOTE — [facility] Group Notes (Signed)
[facility] LCSW Aftercare Discharge Planning Group Note   01/16/2016 10:57 AM  Participation Quality:  Patient is scheduled ECT and did not attend group.   Lulu Riding, MSW, Theresia Majors  01/16/2016

## 2016-01-16 NOTE — Transfer of Care (Signed)
Immediate Anesthesia Transfer of Care Note  Patient: Christian [name]  Procedure(s) Performed: * No procedures listed *  Patient Location: PACU  Anesthesia Type:General  Level of Consciousness: sedated  Airway & Oxygen Therapy: Patient Spontanous Breathing and Patient connected to face mask oxygen  Post-op Assessment: Report given to RN and Post -op Vital signs reviewed and stable  Post vital signs: Reviewed and stable  Last Vitals:  Filed Vitals:   01/16/16 0657 01/16/16 0836  BP: 114/67 107/79  Pulse: 94 81  Temp:  35.9 C  Resp:  16    Complications: No apparent anesthesia complications

## 2016-01-16 NOTE — [facility] Group Notes (Signed)
[facility] LCSW Group Therapy  01/16/2016 3:34 PM  Type of Therapy:  Group Therapy  Participation Level:  Did Not Attend  Summary of Progress/Problems: Patient is scheduled ECT today and not able to attend group.  Lulu Riding, MSW, LCSWA 01/16/2016, 3:34 PM

## 2016-01-16 NOTE — Progress Notes (Signed)
[facility] MD Progress Note  01/16/2016 5:11 PM Christian [name]  MRN:  [id number] Subjective: Follow-up as of Friday the 17th for this 65 year old man with bipolar disorder. Patient had his 12th consecutive bilateral ECT treatment today. The treatment itself was tolerated well and the seizure was of the type that would be predicted to be adequate. Patient unfortunately has not shown adequate improvement to justify continuing ECT. His affect remains flat and negative. He spends most of his time in bed. Recently he has been eating and drinking less. The patient denies having any suicidal thoughts and has not acted suicidally but he also remains very withdrawn and does not communicate well. Concern continues to be high about his behavior especially given the impulsive aggression to his wife prior to admission. One new complaint today is that he was saying he had bad heartburn. Principal Problem: Bipolar disorder, current episode depressed, severe, with psychotic features ([place]) Diagnosis:   Patient Active Problem List   Diagnosis Date Noted  . Bipolar I disorder, most recent episode depressed, severe w psychosis ([place]) [F31.5] 12/22/2015  . Bipolar affective disorder, depressed, severe, with psychotic behavior ([place]) [F31.5]   . Bipolar I disorder, current or most recent episode depressed, with psychotic features ([place]) [F31.5]   . Bipolar I disorder, most recent episode depressed ([place]) [F31.30]   . Severe bipolar I disorder with depression ([place]) [F31.4]   . Bipolar I disorder, most recent episode depressed, severe with psychotic features ([place]) [F31.5]   . Overdose [T50.901A] 09/11/2015  . Acute respiratory failure with hypoxia ([place]) [J96.01] 09/05/2015  . Elevated troponin [R79.89] 09/05/2015  . Somnolence [R40.0] 09/05/2015  . Bipolar depression ([place]) [F31.30] 09/05/2015  . Acute bilateral deep vein thrombosis (DVT) of femoral veins (HCC) [I82.413] 09/05/2015  . Bilateral pulmonary embolism ([place]) [I26.99]  09/02/2015  . Labile hypertension [I10]   . Pulmonary embolism with acute cor pulmonale ([place]) [I26.09]   . Dyspnea [R06.00]   . Orthostatic hypotension [I95.1]   . Bipolar disorder, in partial remission, most recent episode depressed ([place]) [F31.75]   . Syncope, near [R55] 08/31/2015  . Affective psychosis, bipolar ([place]) [F31.9]   . Bipolar disorder, now depressed ([place]) [F31.30] 08/28/2015  . Bipolar I disorder, severe, current or most recent episode depressed, with psychotic features ([place]) [F31.5]   . Pressure ulcer [L89.90] 07/30/2015  . Protein-calorie malnutrition, severe ([place]) [E43] 07/30/2015  . Dehydration [E86.0] 07/29/2015  . Bipolar disorder, current episode depressed, severe, with psychotic features ([place]) [F31.5] 07/09/2015  . Diabetes ([place]) [E11.9] 07/09/2015  . UTI (urinary tract infection) [N39.0] 06/19/2014  . Positive blood culture [R78.81] 06/19/2014  . Bipolar affective disorder, current episode manic with psychotic symptoms ([place]) [F31.2] 06/17/2014  . Type II or unspecified type diabetes mellitus with unspecified complication, uncontrolled [E11.8, E11.65] 06/02/2014  . Other and unspecified hyperlipidemia [E78.5] 06/02/2014  . OSA on CPAP [G47.33] 06/02/2014  . 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 Gout [M10.9] 06/02/2014  . Hypertension [I10]    Total Time spent with patient: 30 minutes  Past Psychiatric History: Long history of bipolar disorder with history of suicide attempts and now a history of aggression  Past Medical History:  Past Medical History  Diagnosis Date  . Mood swings ([place])   . Type II or unspecified type diabetes mellitus with unspecified complication, uncontrolled 06/02/2014  . Other and unspecified hyperlipidemia 06/02/2014  . OSA on CPAP 06/02/2014  . 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  Bipolar disorder ([place])   . Hypertension   . Gout   . Bipolar 1  disorder ([place])   . Hx of blood clots    History reviewed. No pertinent past surgical history. Family History:  Family History  Problem Relation Age of Onset  . Dementia Mother   . Arthritis Father   . Hypertension Father   . Heart failure Neg Hx   . Kidney failure Neg Hx   . Cancer Neg Hx   . Mental illness Sister   . Diabetes Sister    Family Psychiatric  History: Positive for bipolar disorder and anxiety Social History:  History  Alcohol Use No     History  Drug Use No    Social History   Social History  . Marital Status: Married    Spouse Name: N/A  . Number of Children: N/A  . Years of Education: N/A   Social History Main Topics  . Smoking status: Never Smoker   . Smokeless tobacco: None  . Alcohol Use: No  . Drug Use: No  . Sexual Activity: Not Currently   Other Topics Concern  . None   Social History Narrative   *The patient has been married for over 30 years and is currently on disability. He lives with his wife in the [place] area and has one daughter.             Additional Social History:                         Sleep: Poor  Appetite:  Fair  Current Medications: Current Facility-Administered Medications  Medication Dose Route Frequency Provider Last Rate Last Dose  . acetaminophen (TYLENOL) tablet 650 mg  650 mg Oral Q6H PRN Gonzella Lex, MD   650 mg at 01/15/16 2041  . allopurinol (ZYLOPRIM) tablet 300 mg  300 mg Oral Daily Gonzella Lex, MD   300 mg at 01/16/16 1224  . alum & mag hydroxide-simeth (MAALOX/MYLANTA) 200-200-20 MG/5ML suspension 30 mL  30 mL Oral Q4H PRN Gonzella Lex, MD      . apixaban (ELIQUIS) tablet 5 mg  5 mg Oral BID Gonzella Lex, MD   5 mg at 01/16/16 1226  . aspirin EC tablet 81 mg  81 mg Oral Daily Gonzella Lex, MD   81 mg at 01/16/16 1224  . atorvastatin (LIPITOR) tablet 10 mg  10 mg Oral q1800 Gonzella Lex, MD   10 mg at 01/16/16 1700  . dextrose 5 % solution   Intravenous Continuous Gonzella Lex, MD 10 mL/hr at 01/12/16 1130    . docusate sodium (COLACE) capsule 100 mg  100 mg Oral BID Gonzella Lex, MD   100 mg at 01/15/16 1016  . finasteride (PROSCAR) tablet 5 mg  5 mg Oral Daily Gonzella Lex, MD   5 mg at 01/16/16 1224  . furosemide (LASIX) tablet 20 mg  20 mg Oral Daily Gonzella Lex, MD   20 mg at 01/16/16 1224  . insulin aspart (novoLOG) injection 0-9 Units  0-9 Units Subcutaneous TID WC Chauncey Mann, MD   2 Units at 01/16/16 1659  . lisinopril (PRINIVIL,ZESTRIL) tablet 40 mg  40 mg Oral Daily Clovis Fredrickson, MD   40 mg at 01/16/16 0803  . magnesium hydroxide (MILK OF MAGNESIA) suspension 30 mL  30 mL Oral Daily PRN Gonzella Lex, MD   30 mL at 01/08/16 2315  .  metFORMIN (GLUCOPHAGE) tablet 500 mg  500 mg Oral BID WC Gonzella Lex, MD   500 mg at 01/16/16 1659  . metoprolol tartrate (LOPRESSOR) tablet 12.5 mg  12.5 mg Oral BID Gonzella Lex, MD   12.5 mg at 01/16/16 0657  . mirtazapine (REMERON) tablet 45 mg  45 mg Oral QHS Gonzella Lex, MD   45 mg at 01/15/16 2129  . ondansetron (ZOFRAN-ODT) disintegrating tablet 4 mg  4 mg Oral Q8H PRN Gonzella Lex, MD   4 mg at 01/09/16 1340  . pantoprazole (PROTONIX) EC tablet 40 mg  40 mg Oral Daily Gonzella Lex, MD   40 mg at 01/16/16 1700  . polyethylene glycol (MIRALAX / GLYCOLAX) packet 17 g  17 g Oral Daily Gonzella Lex, MD   17 g at 01/15/16 1026  . QUEtiapine (SEROQUEL) tablet 400 mg  400 mg Oral QHS Gonzella Lex, MD   400 mg at 01/15/16 2129  . tamsulosin (FLOMAX) capsule 0.4 mg  0.4 mg Oral QPC supper Gonzella Lex, MD   0.4 mg at 01/16/16 1700    Lab Results:  Results for orders placed or performed during the hospital encounter of 12/22/15 (from the past 48 hour(s))  Basic metabolic panel     Status: Abnormal   Collection Time: 01/15/16  7:07 AM  Result Value Ref Range   Sodium 134 (L) 135 - 145 mmol/L   Potassium 4.2 3.5 - 5.1 mmol/L   Chloride 101 101 - 111 mmol/L   CO2 27 22 - 32 mmol/L    Glucose, Bld 95 65 - 99 mg/dL   BUN 48 (H) 6 - 20 mg/dL   Creatinine, Ser 1.41 (H) 0.61 - 1.24 mg/dL   Calcium 9.6 8.9 - 10.3 mg/dL   GFR calc non Af Amer 52 (L) >60 mL/min   GFR calc Af Amer >60 >60 mL/min    Comment: (NOTE) The eGFR has been calculated using the CKD EPI equation. This calculation has not been validated in all clinical situations. eGFR's persistently <60 mL/min signify possible Chronic Kidney Disease.    Anion gap 6 5 - 15  Lithium level     Status: None   Collection Time: 01/15/16  7:07 AM  Result Value Ref Range   Lithium Lvl 0.94 0.60 - 1.20 mmol/L  Creatinine, serum     Status: Abnormal   Collection Time: 01/16/16  6:55 AM  Result Value Ref Range   Creatinine, Ser 1.63 (H) 0.61 - 1.24 mg/dL   GFR calc non Af Amer 44 (L) >60 mL/min   GFR calc Af Amer 51 (L) >60 mL/min    Comment: (NOTE) The eGFR has been calculated using the CKD EPI equation. This calculation has not been validated in all clinical situations. eGFR's persistently <60 mL/min signify possible Chronic Kidney Disease.   CBC     Status: None   Collection Time: 01/16/16  6:55 AM  Result Value Ref Range   WBC 4.5 3.8 - 10.6 K/uL   RBC 4.66 4.40 - 5.90 MIL/uL   Hemoglobin 13.8 13.0 - 18.0 g/dL   HCT 41.5 40.0 - 52.0 %   MCV 89.1 80.0 - 100.0 fL   MCH 29.7 26.0 - 34.0 pg   MCHC 33.3 32.0 - 36.0 g/dL   RDW 13.3 11.5 - 14.5 %   Platelets 152 150 - 440 K/uL    Physical Findings: AIMS: Facial and Oral Movements Muscles of Facial Expression: None, normal Lips and  Perioral Area: None, normal Jaw: None, normal Tongue: None, normal,Extremity Movements Upper (arms, wrists, hands, fingers): None, normal Lower (legs, knees, ankles, toes): None, normal, Trunk Movements Neck, shoulders, hips: None, normal, Overall Severity Severity of abnormal movements (highest score from questions above): None, normal Incapacitation due to abnormal movements: None, normal Patient's awareness of abnormal movements  (rate only patient's report): No Awareness, Dental Status Current problems with teeth and/or dentures?: No Does patient usually wear dentures?: No  CIWA:    COWS:     Musculoskeletal: Strength & Muscle Tone: decreased Gait & Station: shuffle Patient leans: N/A  Psychiatric Specialty Exam: Review of Systems  HENT: Negative.   Eyes: Negative.   Respiratory: Negative.   Cardiovascular: Negative.   Gastrointestinal: Negative.   Musculoskeletal: Negative.   Skin: Negative.   Neurological: Positive for weakness.  Psychiatric/Behavioral: Positive for depression and memory loss. Negative for suicidal ideas, hallucinations and substance abuse. The patient is nervous/anxious and has insomnia.     Blood pressure 133/89, pulse 82, temperature 100.4 F (38 C), temperature source Oral, resp. rate 20, height 6' (1.829 m), weight 102.967 kg (227 lb), SpO2 98 %.Body mass index is 30.78 kg/(m^2).  General Appearance: Casual  Eye Contact::  Fair  Speech:  Slow  Volume:  Decreased  Mood:  Dysphoric  Affect:  Constricted  Thought Process:  Tangential  Orientation:  Full (Time, Place, and Person)  Thought Content:  Rumination  Suicidal Thoughts:  No  Homicidal Thoughts:  No  Memory:  Immediate;   Fair Recent;   Fair Remote;   Fair  Judgement:  Poor  Insight:  Fair  Psychomotor Activity:  Psychomotor Retardation  Concentration:  Fair  Recall:  [employer] of Knowledge:Fair  Language: Fair  Akathisia:  No  Handed:  Right  AIMS (if indicated):     Assets:  Desire for Improvement Resilience Social Support  ADL's:  Impaired  Cognition: Impaired,  Mild  Sleep:  Number of Hours: 8.25    Patient has now had 12 ECT treatments and has really not shown significant improvement. There have been some ups and downs and he and some slight improvement but overall he remains withdrawn and negative. I don't think that it is justified at this time for [place] to continue ECT. I have talked with the  treatment team and we are working on a referral to [facility] for perhaps longer term treatment. One new complaint today was heartburn which I have addressed by adding pantoprazole. Another new complaint today was a tremor which did look worse than previous. I have scheduled a lithium level check for Monday but in the meantime I have stopped his lithium. The level was only 0.9 but this could be one cause of his tremor. Another cause could be his lower blood sugars due to his not eating. Patient has been counseled about this and we will need to keep a close eye on his food intake. No other immediate change to his medicine. 01/16/2016, 5:11 PM

## 2016-01-16 NOTE — Procedures (Signed)
ECT SERVICES Physician's Interval Evaluation & Treatment Note  Patient Identification: Christian [name] MRN:  [id number] Date of Evaluation:  01/16/2016 TX #: [id number]  MADRS:   MMSE:   P.E. Findings:  Blood pressure and sugar were both lower than average today. No other specific finding heart and lungs clear. He does however complain of a tremor that is gotten worse in the last day and some of that is noticeable  Psychiatric Interval Note:  Mood continues to be down flat and unmotivated slow. Not eating or drinking very well.  Subjective:  Patient is a 65 y.o. male seen for evaluation for Electroconvulsive Therapy. Follow-up is to be determined at this point  Treatment Summary:   []   Right Unilateral             []  Bilateral   % Energy : 1.0 ms 100%    Impedance: 970 ohms  Seizure Energy Index: 805 V squared  Postictal Suppression Index: Less than 10%  Seizure Concordance Index: 98%  Medications  Pre Shock: Labetalol 20 mg ketamine 120 mg succinylcholine 150 mg  Post Shock:    Seizure Duration: 22 seconds by EMG, 41 seconds by EEG   Comments: Were not going to put him on the schedule at this point for Monday further treatment to be determined   Lungs:  [x]   Clear to auscultation               []  Other:   Heart:    [x]   Regular rhythm             []  irregular rhythm    [x]   Previous H&P reviewed, patient examined and there are NO CHANGES                 []   Previous H&P reviewed, patient examined and there are changes noted.   [name], MD 2/17/201710:55 AM

## 2016-01-16 NOTE — Progress Notes (Signed)
D: Pt remains isolative in his room this evening. Affect brightens upon approach. Pt denies SI/HI/AVH at this time. Denies pain. Pt rates his depression at this time as a 5 out of 10. This evening, pt's temperature was elevated. Pt given PRN Tylenol and reevaluated an hour later. Temperature had decreased upon reassessment. A: Emotional support and encouragement provided. Medications administered with education. q15 minute safety checks maintained. R: Pt remains free from harm.
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
## 2016-01-16 NOTE — Anesthesia Postprocedure Evaluation (Signed)
Anesthesia Post Note  Patient: Christian [name]  Procedure(s) Performed: * No procedures listed *  Patient location during evaluation: PACU Anesthesia Type: General Level of consciousness: awake and alert Pain management: pain level controlled Vital Signs Assessment: post-procedure vital signs reviewed and stable Respiratory status: spontaneous breathing, nonlabored ventilation, respiratory function stable and patient connected to nasal cannula oxygen Cardiovascular status: blood pressure returned to baseline and stable Postop Assessment: no signs of nausea or vomiting Anesthetic complications: no    Last Vitals:  Filed Vitals:   01/16/16 1145 01/16/16 1152  BP: 141/101 133/89  Pulse: 84 82  Temp:  38 C  Resp: 18 20    Last Pain:  Filed Vitals:   01/16/16 1328  PainSc: Asleep                 Lenard Simmer

## 2016-01-16 NOTE — Plan of Care (Signed)
Problem: Ineffective individual coping Goal: LTG: Patient will report a decrease in negative feelings Outcome: Not Progressing Patient did not report feeling better today than he did yesterday

## 2016-01-16 NOTE — [facility] Group Notes (Signed)
[facility] LCSW Group Therapy  01/16/2016 8:51 AM  Type of Therapy:  Group Therapy  Participation Level:  Did Not Attend  Summary of Progress/Problems: Patient was called to group but did not attend.  Lulu Riding, MSW, LCSWA 01/16/2016, 8:51 AM

## 2016-01-16 NOTE — Progress Notes (Signed)
D:  Patient is alert but complains of being "disoriented" on the unit this shift.  Patient had ECT this shift.  Patient denies suicidal ideation, homicidal ideation, auditory or visual hallucinations at the present time.   A:  Scheduled medications are administered to patient as per MD orders.  Emotional support and encouragement are provided.  Patient is maintained on q.15 minute safety checks.  Patient is informed to notify staff with questions or concerns. R:  No adverse medication reactions are noted.  Patient is cooperative with medication administration and treatment plan today.  Patient is receptive, calm and cooperative on the unit at this time.  Patient contracts for safety at this time.  Patient remains safe at this time.

## 2016-01-16 NOTE — Anesthesia Preprocedure Evaluation (Signed)
Anesthesia Evaluation  Patient identified by MRN, date of birth, ID band Patient awake    Reviewed: Allergy & Precautions, NPO status , Patient's Chart, lab work & pertinent test results  Airway Mallampati: III  TM Distance: >3 FB Neck ROM: Limited    Dental  (+) Poor Dentition, Missing   Pulmonary shortness of breath and with exertion, sleep apnea and Continuous Positive Airway Pressure Ventilation ,    Pulmonary exam normal breath sounds clear to auscultation       Cardiovascular Exercise Tolerance: Poor hypertension, Pt. on medications and Pt. on home beta blockers + Peripheral Vascular Disease  Normal cardiovascular exam  On elequis.   Neuro/Psych PSYCHIATRIC DISORDERS Depression Bipolar Disorder negative neurological ROS     GI/Hepatic negative GI ROS, Neg liver ROS,   Endo/Other  diabetes, Well Controlled, Type 2BG 88.  Renal/GU negative Renal ROS  negative genitourinary   Musculoskeletal negative musculoskeletal ROS (+)   Abdominal (+) + obese,   Peds negative pediatric ROS (+)  Hematology negative hematology ROS (+)   Anesthesia Other Findings   Reproductive/Obstetrics                             Anesthesia Physical  Anesthesia Plan  ASA: III  Anesthesia Plan: General   Post-op Pain Management:    Induction: Intravenous  Airway Management Planned: Mask  Additional Equipment:   Intra-op Plan:   Post-operative Plan:   Informed Consent: I have reviewed the patients History and Physical, chart, labs and discussed the procedure including the risks, benefits and alternatives for the proposed anesthesia with the patient or authorized representative who has indicated his/her understanding and acceptance.     Plan Discussed with: CRNA  Anesthesia Plan Comments:         Anesthesia Quick Evaluation

## 2016-01-17 LAB — GLUCOSE, CAPILLARY
Glucose-Capillary: 89 mg/dL (ref 65–99)
Glucose-Capillary: 168 mg/dL — ABNORMAL HIGH (ref 65–99)
Glucose-Capillary: 71 mg/dL (ref 65–99)
Glucose-Capillary: 135 mg/dL — ABNORMAL HIGH (ref 65–99)

## 2016-01-17 MED ORDER — ENSURE ENLIVE PO LIQD: 237.0000 mL | Freq: Three times a day (TID) | ORAL | Status: DC

## 2016-01-17 MED ORDER — ADULT MULTIVITAMIN W/MINERALS CH
1.0000 | ORAL_TABLET | Freq: Every day | ORAL | Status: DC
  Administered 2016-01-17 – 2016-02-03 (×18): 1 via ORAL
  Filled 2016-01-17 (×18): qty 1

## 2016-01-17 MED ORDER — GLUCERNA SHAKE PO LIQD
237.0000 mL | Freq: Three times a day (TID) | ORAL | Status: DC
  Administered 2016-01-17 – 2016-01-22 (×17): 237 mL via ORAL

## 2016-01-17 MED ORDER — FUROSEMIDE 20 MG PO TABS: 20.0000 mg | ORAL_TABLET | Freq: Every day | ORAL | Status: DC | PRN

## 2016-01-17 NOTE — Progress Notes (Signed)
D: Observed pt in room lying in bed. Patient alert and oriented x4. Patient denies SI/HI/AVH. Pt affect is depressed. Pt stated "I'm feeling a bit better now" in regards to having thrown up earlier. Pt denied nausea. Pt temperature slightly elevated at 99.1, but pt has no physical complaints currently. Pt rated depression 8/10 and anxiety 7/10. Pt indicated his mood still has not improved at all. Pt refusing colace, indicating that he had a bowel movement yesterday. A: Offered active listening and support. Provided therapeutic communication. Administered scheduled medications.  R: Pt pleasant and cooperative. Pt medication compliant. Will continue Q15 min. checks. Safety maintained.

## 2016-01-17 NOTE — Progress Notes (Signed)
Pt has been seclusive to his room, complaining of being tried, weak and unable to walk. Pt has been ambulating via W/C. Pt's mood and affect has been depressed. Pt noted to have some tremors today. Pt note attending any unit activities.

## 2016-01-17 NOTE — [facility] Group Notes (Signed)
[facility] LCSW Group Therapy  01/17/2016 3:09 PM  Type of Therapy:  Group Therapy  Participation Level:  Did Not Attend  Modes of Intervention:  Discussion, Education, Socialization and Support  Summary of Progress/Problems: Self esteem: Patients discussed self esteem and how it impacts them. They discussed what aspects in their lives has influenced their self esteem. They were challenged to identify changes that are needed in order to improve self esteem.    Christian [name] L [name] MSW, LCSWA  01/17/2016, 3:09 PM   

## 2016-01-17 NOTE — Progress Notes (Signed)
[facility] MD Progress Note  01/17/2016 10:24 AM Christian [name]  MRN:  [id number] Subjective: Follow-up as of Friday the 17th for this 65 year old man with bipolar disorder.   Today the patient reports having severe depression, weakness, anhedonia, poor appetite. He denies suicidality, homicidality or having auditory or visual hallucinations. The patient has been complaining of tremors however during examination he appears to be having myoclonus.  I will order an EEG to rule out seizures. Creatinine also has been elevated since January. Today I will discontinue Lasix. I will order electrolytes tomorrow morning. We'll push fluids today. As patient's oral intake has been poor and had added multivitamins with minerals.    Principal Problem: Bipolar disorder, current episode depressed, severe, with psychotic features ([place]) Diagnosis:   Patient Active Problem List   Diagnosis Date Noted  . Bipolar I disorder, most recent episode depressed, severe w psychosis ([place]) [F31.5] 12/22/2015  . Bipolar affective disorder, depressed, severe, with psychotic behavior ([place]) [F31.5]   . Bipolar I disorder, current or most recent episode depressed, with psychotic features ([place]) [F31.5]   . Bipolar I disorder, most recent episode depressed ([place]) [F31.30]   . Severe bipolar I disorder with depression ([place]) [F31.4]   . Bipolar I disorder, most recent episode depressed, severe with psychotic features ([place]) [F31.5]   . Overdose [T50.901A] 09/11/2015  . Acute respiratory failure with hypoxia ([place]) [J96.01] 09/05/2015  . Elevated troponin [R79.89] 09/05/2015  . Somnolence [R40.0] 09/05/2015  . Bipolar depression ([place]) [F31.30] 09/05/2015  . Acute bilateral deep vein thrombosis (DVT) of femoral veins (HCC) [I82.413] 09/05/2015  . Bilateral pulmonary embolism ([place]) [I26.99] 09/02/2015  . Labile hypertension [I10]   . Pulmonary embolism with acute cor pulmonale ([place]) [I26.09]   . Dyspnea [R06.00]   . Orthostatic  hypotension [I95.1]   . Bipolar disorder, in partial remission, most recent episode depressed ([place]) [F31.75]   . Syncope, near [R55] 08/31/2015  . Affective psychosis, bipolar ([place]) [F31.9]   . Bipolar disorder, now depressed ([place]) [F31.30] 08/28/2015  . Bipolar I disorder, severe, current or most recent episode depressed, with psychotic features ([place]) [F31.5]   . Pressure ulcer [L89.90] 07/30/2015  . Protein-calorie malnutrition, severe ([place]) [E43] 07/30/2015  . Dehydration [E86.0] 07/29/2015  . Bipolar disorder, current episode depressed, severe, with psychotic features ([place]) [F31.5] 07/09/2015  . Diabetes ([place]) [E11.9] 07/09/2015  . UTI (urinary tract infection) [N39.0] 06/19/2014  . Positive blood culture [R78.81] 06/19/2014  . Bipolar affective disorder, current episode manic with psychotic symptoms ([place]) [F31.2] 06/17/2014  . Type II or unspecified type diabetes mellitus with unspecified complication, uncontrolled [E11.8, E11.65] 06/02/2014  . Other and unspecified hyperlipidemia [E78.5] 06/02/2014  . OSA on CPAP [G47.33] 06/02/2014  . 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 Gout [M10.9] 06/02/2014  . Hypertension [I10]    Total Time spent with patient: 30 minutes  Past Psychiatric History: Long history of bipolar disorder with history of suicide attempts and now a history of aggression  Past Medical History:  Past Medical History  Diagnosis Date  . Mood swings ([place])   . Type II or unspecified type diabetes mellitus with unspecified complication, uncontrolled 06/02/2014  . Other and unspecified hyperlipidemia 06/02/2014  . OSA on CPAP 06/02/2014  . 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 Bipolar disorder ([place])   . Hypertension   . Gout   . Bipolar 1 disorder ([place])   . Hx of blood clots  History reviewed. No pertinent past surgical history. Family History:  Family History  Problem  Relation Age of Onset  . Dementia Mother   . Arthritis Father   . Hypertension Father   . Heart failure Neg Hx   . Kidney failure Neg Hx   . Cancer Neg Hx   . Mental illness Sister   . Diabetes Sister    Family Psychiatric  History: Positive for bipolar disorder and anxiety Social History:  History  Alcohol Use No     History  Drug Use No    Social History   Social History  . Marital Status: Married    Spouse Name: N/A  . Number of Children: N/A  . Years of Education: N/A   Social History Main Topics  . Smoking status: Never Smoker   . Smokeless tobacco: None  . Alcohol Use: No  . Drug Use: No  . Sexual Activity: Not Currently   Other Topics Concern  . None   Social History Narrative   *The patient has been married for over 30 years and is currently on disability. He lives with his wife in the [place] area and has one daughter.             Additional Social History:                         Sleep: Poor  Appetite:  Fair  Current Medications: Current Facility-Administered Medications  Medication Dose Route Frequency Provider Last Rate Last Dose  . acetaminophen (TYLENOL) tablet 650 mg  650 mg Oral Q6H PRN Gonzella Lex, MD   650 mg at 01/15/16 2041  . allopurinol (ZYLOPRIM) tablet 300 mg  300 mg Oral Daily Gonzella Lex, MD   300 mg at 01/17/16 0901  . alum & mag hydroxide-simeth (MAALOX/MYLANTA) 200-200-20 MG/5ML suspension 30 mL  30 mL Oral Q4H PRN Gonzella Lex, MD      . apixaban (ELIQUIS) tablet 5 mg  5 mg Oral BID Gonzella Lex, MD   5 mg at 01/17/16 0858  . aspirin EC tablet 81 mg  81 mg Oral Daily Gonzella Lex, MD   81 mg at 01/17/16 0901  . atorvastatin (LIPITOR) tablet 10 mg  10 mg Oral q1800 Gonzella Lex, MD   10 mg at 01/16/16 1700  . dextrose 5 % solution   Intravenous Continuous Gonzella Lex, MD 10 mL/hr at 01/12/16 1130    . docusate sodium (COLACE) capsule 100 mg  100 mg Oral BID Gonzella Lex, MD   100 mg at 01/17/16 0901   . feeding supplement (ENSURE ENLIVE) (ENSURE ENLIVE) liquid 237 mL  237 mL Oral TID Hildred Priest, MD      . finasteride (PROSCAR) tablet 5 mg  5 mg Oral Daily Gonzella Lex, MD   5 mg at 01/17/16 0901  . furosemide (LASIX) tablet 20 mg  20 mg Oral Daily PRN Hildred Priest, MD      . insulin aspart (novoLOG) injection 0-9 Units  0-9 Units Subcutaneous TID WC Chauncey Mann, MD   2 Units at 01/16/16 1659  . lisinopril (PRINIVIL,ZESTRIL) tablet 40 mg  40 mg Oral Daily Clovis Fredrickson, MD   40 mg at 01/17/16 0858  . magnesium hydroxide (MILK OF MAGNESIA) suspension 30 mL  30 mL Oral Daily PRN Gonzella Lex, MD   30 mL at 01/08/16 2315  . metFORMIN (GLUCOPHAGE) tablet 500 mg  500 mg Oral BID WC Gonzella Lex, MD   500 mg at 01/17/16 0858  . metoprolol tartrate (LOPRESSOR) tablet 12.5 mg  12.5 mg Oral BID Gonzella Lex, MD   12.5 mg at 01/17/16 0900  . mirtazapine (REMERON) tablet 45 mg  45 mg Oral QHS Gonzella Lex, MD   45 mg at 01/16/16 2132  . multivitamin with minerals tablet 1 tablet  1 tablet Oral Daily Hildred Priest, MD      . ondansetron (ZOFRAN-ODT) disintegrating tablet 4 mg  4 mg Oral Q8H PRN Gonzella Lex, MD   4 mg at 01/09/16 1340  . ondansetron (ZOFRAN-ODT) disintegrating tablet 4 mg  4 mg Oral Q8H PRN Gonzella Lex, MD   4 mg at 01/16/16 1739  . pantoprazole (PROTONIX) EC tablet 40 mg  40 mg Oral Daily Gonzella Lex, MD   40 mg at 01/17/16 0859  . polyethylene glycol (MIRALAX / GLYCOLAX) packet 17 g  17 g Oral Daily Gonzella Lex, MD   17 g at 01/17/16 0903  . QUEtiapine (SEROQUEL) tablet 400 mg  400 mg Oral QHS Gonzella Lex, MD   400 mg at 01/16/16 2131  . tamsulosin (FLOMAX) capsule 0.4 mg  0.4 mg Oral QPC supper Gonzella Lex, MD   0.4 mg at 01/16/16 1700    Lab Results:  Results for orders placed or performed during the hospital encounter of 12/22/15 (from the past 48 hour(s))  Creatinine, serum     Status: Abnormal    Collection Time: 01/16/16  6:55 AM  Result Value Ref Range   Creatinine, Ser 1.63 (H) 0.61 - 1.24 mg/dL   GFR calc non Af Amer 44 (L) >60 mL/min   GFR calc Af Amer 51 (L) >60 mL/min    Comment: (NOTE) The eGFR has been calculated using the CKD EPI equation. This calculation has not been validated in all clinical situations. eGFR's persistently <60 mL/min signify possible Chronic Kidney Disease.   CBC     Status: None   Collection Time: 01/16/16  6:55 AM  Result Value Ref Range   WBC 4.5 3.8 - 10.6 K/uL   RBC 4.66 4.40 - 5.90 MIL/uL   Hemoglobin 13.8 13.0 - 18.0 g/dL   HCT 41.5 40.0 - 52.0 %   MCV 89.1 80.0 - 100.0 fL   MCH 29.7 26.0 - 34.0 pg   MCHC 33.3 32.0 - 36.0 g/dL   RDW 13.3 11.5 - 14.5 %   Platelets 152 150 - 440 K/uL  Glucose, capillary     Status: None   Collection Time: 01/17/16  7:07 AM  Result Value Ref Range   Glucose-Capillary 89 65 - 99 mg/dL    Physical Findings: AIMS: Facial and Oral Movements Muscles of Facial Expression: None, normal Lips and Perioral Area: None, normal Jaw: None, normal Tongue: None, normal,Extremity Movements Upper (arms, wrists, hands, fingers): None, normal Lower (legs, knees, ankles, toes): None, normal, Trunk Movements Neck, shoulders, hips: None, normal, Overall Severity Severity of abnormal movements (highest score from questions above): None, normal Incapacitation due to abnormal movements: None, normal Patient's awareness of abnormal movements (rate only patient's report): No Awareness, Dental Status Current problems with teeth and/or dentures?: No Does patient usually wear dentures?: No  CIWA:    COWS:     Musculoskeletal: Strength & Muscle Tone: decreased Gait & Station: shuffle Patient leans: N/A  Psychiatric Specialty Exam: Review of Systems  HENT: Negative.   Eyes: Negative.  Respiratory: Negative.   Cardiovascular: Negative.   Gastrointestinal: Negative.   Musculoskeletal: Negative.   Skin: Negative.    Neurological: Positive for weakness.  Psychiatric/Behavioral: Positive for depression and memory loss. Negative for suicidal ideas, hallucinations and substance abuse. The patient is nervous/anxious and has insomnia.     Blood pressure 123/76, pulse 118, temperature 99.1 F (37.3 C), temperature source Oral, resp. rate 20, height 6' (1.829 m), weight 102.967 kg (227 lb), SpO2 98 %.Body mass index is 30.78 kg/(m^2).  General Appearance: Casual  Eye Contact::  Fair  Speech:  Slow  Volume:  Decreased  Mood:  Dysphoric  Affect:  Constricted  Thought Process:  Tangential  Orientation:  Full (Time, Place, and Person)  Thought Content:  Rumination  Suicidal Thoughts:  No  Homicidal Thoughts:  No  Memory:  Immediate;   Fair Recent;   Fair Remote;   Fair  Judgement:  Poor  Insight:  Fair  Psychomotor Activity:  Psychomotor Retardation  Concentration:  Fair  Recall:  [employer] of Knowledge:Fair  Language: Fair  Akathisia:  No  Handed:  Right  AIMS (if indicated):     Assets:  Desire for Improvement Resilience Social Support  ADL's:  Impaired  Cognition: Impaired,  Mild  Sleep:  Number of Hours: 7.5    Patient has now had 12 ECT treatments and has really not shown significant improvement. There have been some ups and downs and he and some slight improvement but overall he remains withdrawn and negative. I don't think that it is justified at this time for [place] to continue ECT. I have talked with the treatment team and we are working on a referral to [facility] for perhaps longer term treatment. One new complaint today was heartburn which I have addressed by adding pantoprazole. Another new complaint today was a tremor which did look worse than previous. I have scheduled a lithium level check for Monday but in the meantime I have stopped his lithium. The level was only 0.9 but this could be one cause of his tremor. Another cause could be his lower blood sugars due to his not  eating. Patient has been counseled about this and we will need to keep a close eye on his food intake. No other immediate change to his medicine.    As I mentioned above today the Lasix will be changed to when necessary EEG will be ordered to rule out seizure disorder as patient is having myoclonus Labs will be check in the morning As creatinine is elevated will push fluids today.  01/17/2016, 10:24 AM

## 2016-01-17 NOTE — Plan of Care (Signed)
Problem: Alteration in mood Goal: LTG-Pt's behavior demonstrates decreased signs of depression (Patient's behavior demonstrates decreased signs of depression to the point the patient is safe to return home and continue treatment in an outpatient setting)  Outcome: Not Progressing Pt rated depression 8/10 and isolative to room most evening.

## 2016-01-18 ENCOUNTER — Encounter: Payer: Self-pay | Admitting: Internal Medicine

## 2016-01-18 LAB — GLUCOSE, CAPILLARY
Glucose-Capillary: 98 mg/dL (ref 65–99)
Glucose-Capillary: 145 mg/dL — ABNORMAL HIGH (ref 65–99)
Glucose-Capillary: 111 mg/dL — ABNORMAL HIGH (ref 65–99)
Glucose-Capillary: 190 mg/dL — ABNORMAL HIGH (ref 65–99)

## 2016-01-18 LAB — COMPREHENSIVE METABOLIC PANEL
ALT: 11 U/L — ABNORMAL LOW (ref 17–63)
AST: 11 U/L — ABNORMAL LOW (ref 15–41)
Albumin: 3.7 g/dL (ref 3.5–5.0)
Alkaline Phosphatase: 77 U/L (ref 38–126)
Anion gap: 6 (ref 5–15)
BUN: 62 mg/dL — ABNORMAL HIGH (ref 6–20)
CO2: 30 mmol/L (ref 22–32)
Calcium: 9.6 mg/dL (ref 8.9–10.3)
Chloride: 99 mmol/L — ABNORMAL LOW (ref 101–111)
Creatinine, Ser: 2.37 mg/dL — ABNORMAL HIGH (ref 0.61–1.24)
GFR calc Af Amer: 32 mL/min — ABNORMAL LOW (ref >60)
GFR calc non Af Amer: 28 mL/min — ABNORMAL LOW (ref >60)
Glucose, Bld: 92 mg/dL (ref 65–99)
Potassium: 5 mmol/L (ref 3.5–5.1)
Sodium: 135 mmol/L (ref 135–145)
Total Bilirubin: 0.5 mg/dL (ref 0.3–1.2)
Total Protein: 6.6 g/dL (ref 6.5–8.1)

## 2016-01-18 MED ORDER — ALLOPURINOL 100 MG PO TABS
100.0000 mg | ORAL_TABLET | Freq: Every day | ORAL | Status: DC
  Administered 2016-01-18 – 2016-02-03 (×17): 100 mg via ORAL
  Filled 2016-01-18 (×17): qty 1

## 2016-01-18 NOTE — Progress Notes (Signed)
Patient ID: Christian [name], male   DOB: 1951-06-15, 65 y.o.   MRN: [id number] [facility] -  at [facility]   PATIENT NAME: Christian [name]    MR#:  [id number]  DATE OF BIRTH:  07/04/1951  DATE OF ADMISSION:  12/22/2015  PRIMARY CARE PHYSICIAN: Leanor Rubenstein, MD   REQUESTING/REFERRING PHYSICIAN: Dr. Ardyth Harps  CHIEF COMPLAINT:  Called because of increased creatinine  HISTORY OF PRESENT ILLNESS:  Christian [name]  is a 65 y.o. male with a known history of bipolar disorder, hypertension, chronic kidney disease, diabetes. He's been on the psychiatric floor receiving ECT treatments. The patient developed an upper extremity tremor and laboratory data was sent off and his creatinine had increased from 1.63-2.37 and hospitalist services were contacted for consultation. The patient is a very poor historian. He does answer yes or no questions and can talk with you. I've seen this patient in the past and previously he did not eat at all and was catatonic. This is an improvement from when I seen him last. I saw him after he ate breakfast. He ate his entire meal and drank his entire milk. Patient has developed some jerking in his arms.  PAST MEDICAL HISTORY:   Past Medical History  Diagnosis Date  . Mood swings (HCC)   . Type II or unspecified type diabetes mellitus with unspecified complication, uncontrolled 06/02/2014  . Other and unspecified hyperlipidemia 06/02/2014  . OSA on CPAP 06/02/2014  . 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 Bipolar disorder (HCC)   . Hypertension   . Gout   . Bipolar 1 disorder (HCC)   . Hx of blood clots   . Chronic kidney disease     PAST SURGICAL HISTOIRY:  History reviewed. No pertinent past surgical history.  The patient cannot tell me if he had a surgery or not  SOCIAL HISTORY:   Social History  Substance Use Topics  . Smoking status: Never Smoker   . Smokeless tobacco: Not on file  .  Alcohol Use: No    FAMILY HISTORY:   Family History  Problem Relation Age of Onset  . Dementia Mother   . Arthritis Father   . Hypertension Father   . Heart failure Neg Hx   . Kidney failure Neg Hx   . Cancer Neg Hx   . Mental illness Sister   . Diabetes Sister     DRUG ALLERGIES:  No Known Allergies  REVIEW OF SYSTEMS:  CONSTITUTIONAL: No fever, positive fatigue and weakness. Positive for weight loss EYES: No blurred or double vision. Wears glasses EARS, NOSE, AND THROAT: No tinnitus or ear pain.  RESPIRATORY: No cough, shortness of breath, wheezing or hemoptysis.  CARDIOVASCULAR: No chest pain, orthopnea.  GASTROINTESTINAL: No nausea, vomiting, diarrhea or abdominal pain.  GENITOURINARY: No dysuria, hematuria.  ENDOCRINE: No polyuria, nocturia,  HEMATOLOGY: No anemia, easy bruising or bleeding SKIN: No rash or lesion. MUSCULOSKELETAL: No joint pain or arthritis.   NEUROLOGIC: Positive for tremor upper extremities. PSYCHIATRY: Positive for mood disorder  MEDICATIONS AT HOME:   Prior to Admission medications   Medication Sig Start Date End Date Taking? Authorizing Provider  allopurinol (ZYLOPRIM) 300 MG tablet Take 1 tablet (300 mg total) by mouth daily. 09/18/15  Yes Audery Amel, MD  apixaban (ELIQUIS) 5 MG TABS tablet Take 1 tablet (5 mg total) by mouth 2 (two) times daily. 09/18/15  Yes Audery Amel, MD  atorvastatin (LIPITOR) 10 MG tablet Take  1 tablet (10 mg total) by mouth daily at 6 PM. 09/18/15  Yes Audery Amel, MD  clonazePAM (KLONOPIN) 1 MG tablet Take 1 tablet (1 mg total) by mouth at bedtime. 09/18/15  Yes Audery Amel, MD  finasteride (PROSCAR) 5 MG tablet Take 1 tablet (5 mg total) by mouth daily. 09/18/15  Yes Audery Amel, MD  furosemide (LASIX) 20 MG tablet Take 20 mg by mouth daily.    Yes Historical Provider, MD  lisinopril (PRINIVIL,ZESTRIL) 40 MG tablet Take 40 mg by mouth daily.   Yes Historical Provider, MD  metFORMIN (GLUCOPHAGE-XR)  500 MG 24 hr tablet Take 500 mg by mouth 2 (two) times daily.   Yes Historical Provider, MD  mirtazapine (REMERON) 45 MG tablet Take 1 tablet (45 mg total) by mouth at bedtime. 09/18/15  Yes Audery Amel, MD  OLANZapine (ZYPREXA) 15 MG tablet Take 2 tablets (30 mg total) by mouth at bedtime. 09/18/15  Yes Audery Amel, MD  tamsulosin (FLOMAX) 0.4 MG CAPS capsule Take 1 capsule (0.4 mg total) by mouth daily after supper. 09/18/15  Yes Audery Amel, MD  aspirin EC 81 MG EC tablet Take 1 tablet (81 mg total) by mouth daily. Patient not taking: Reported on 12/29/2015 09/18/15   Audery Amel, MD  docusate sodium (COLACE) 100 MG capsule Take 2 capsules (200 mg total) by mouth 2 (two) times daily. Patient not taking: Reported on 12/29/2015 09/18/15   Audery Amel, MD  polyethylene glycol (MIRALAX / GLYCOLAX) packet Take 17 g by mouth daily. Patient not taking: Reported on 12/29/2015 09/18/15   Audery Amel, MD      VITAL SIGNS:  Blood pressure 133/93, pulse 94, temperature 97.8 F (36.6 C), temperature source Oral, resp. rate 20, height 6' (1.829 m), weight 102.967 kg (227 lb), SpO2 98 %.  PHYSICAL EXAMINATION:  GENERAL:  65 y.o.-year-old patient lying in the bed with no acute distress.  EYES: Pupils equal, round, reactive to light and accommodation. No scleral icterus. Extraocular muscles intact.  HEENT: Head atraumatic, normocephalic. Oropharynx and nasopharynx clear.  NECK:  Supple, no jugular venous distention. No thyroid enlargement, no tenderness.  LUNGS: Normal breath sounds bilaterally, no wheezing, rales,rhonchi or crepitation. No use of accessory muscles of respiration.  CARDIOVASCULAR: S1, S2 normal. No murmurs, rubs, or gallops.  ABDOMEN: Soft, nontender, nondistended. Bowel sounds present. No organomegaly or mass.  EXTREMITIES: No pedal edema, cyanosis, or clubbing.  NEUROLOGIC: Cranial nerves II through XII are intact. Muscle strength 5/5 in all extremities. Sensation  intact. Gait not checked. Patient has jerking movement in his arms. PSYCHIATRIC: The patient is alert and answers questions.  SKIN: No obvious rash, lesion, or ulcer.   LABORATORY PANEL:   CBC  Recent Labs Lab 01/16/16 0655  WBC 4.5  HGB 13.8  HCT 41.5  PLT 152   ------------------------------------------------------------------------------------------------------------------  Chemistries   Recent Labs Lab 01/18/16 0618  NA 135  K 5.0  CL 99*  CO2 30  GLUCOSE 92  BUN 62*  CREATININE 2.37*  CALCIUM 9.6  AST 11*  ALT 11*  ALKPHOS 77  BILITOT 0.5    IMPRESSION AND PLAN:   1. Acute renal failure on chronic kidney disease stage III- since the patient is eating and drinking at this point, I encouraged the nursing staff to continue pushing oral fluids. I will check a creatinine tomorrow. Lasix and lisinopril are stopped. I also stopped Glucophage. I renally dosed allopurinol. If the patient continues with  ECT treatments we can give fluid boluses with that treatment. 2. Essential hypertension. Last blood pressure the diastolic number is a little high. Continue to monitor off of lisinopril and Lasix. 3. Tremor upper extremity- if this continues may end up needing a neurology consultation. 4. Type 2 diabetes mellitus- Glucophage contraindicated with creatinine being high. Continue to watch blood sugars 5. History of DVT- on eliquis 6. BPH- continue finasteride and Flomax 7. Weakness- physical therapy evaluation   All the records are reviewed and case discussed with Consulting provider. Management plans discussed with the patient,  and they are in agreement.  CODE STATUS: Full  TOTAL TIME TAKING CARE OF THIS PATIENT: 50  minutes.    Alford Highland M.D on 01/18/2016 at 2:07 PM  Between 7am to 6pm - Pager - [phone]  After 6pm go to [email or web address] - password [email or web address] [facility]  [facility]  Office  [phone]  CC: Primary care Physician: Leanor Rubenstein,  MD

## 2016-01-18 NOTE — [facility] Group Notes (Signed)
[facility] LCSW Group Therapy  01/18/2016 4:35 PM  Type of Therapy:  Group Therapy  Participation Level:  Did Not Attend  Modes of Intervention:  Activity, Discussion, Education, Socialization and Support  Summary of Progress/Problems:Pt will identify unhealthy thoughts and how they impact their emotions and behavior. Pt will be encouraged to discuss these thoughts, emotions and behaviors with the group.   Haidy Kackley L Lachanda Buczek MSW, LCSWA  01/18/2016, 4:35 PM   

## 2016-01-18 NOTE — Progress Notes (Signed)
[facility] MD Progress Note  01/18/2016 10:34 AM Christian [name]  MRN:  [id number] Subjective: Follow-up as of Friday the 17th for this 65 year old man with bipolar disorder.   Today the patient reports having severe depression, weakness, anhedonia, poor appetite. He denies suicidality, homicidality or having auditory or visual hallucinations. The patient has been complaining of tremors however during examination he appears to be having myoclonus.  2/18 I will order an EEG to rule out seizures. Creatinine also has been elevated since January. Today I will discontinue Lasix. I will order electrolytes tomorrow morning. We'll push fluids today. As patient's oral intake has been poor and had added multivitamins with minerals.  2/19 Hospitalist consulted for ARF Per nursing pt has been eating well  Principal Problem: Bipolar disorder, current episode depressed, severe, with psychotic features ([place]) Diagnosis:   Patient Active Problem List   Diagnosis Date Noted  . Bipolar I disorder, most recent episode depressed, severe w psychosis ([place]) [F31.5] 12/22/2015  . Bipolar affective disorder, depressed, severe, with psychotic behavior ([place]) [F31.5]   . Bipolar I disorder, current or most recent episode depressed, with psychotic features ([place]) [F31.5]   . Bipolar I disorder, most recent episode depressed ([place]) [F31.30]   . Severe bipolar I disorder with depression ([place]) [F31.4]   . Bipolar I disorder, most recent episode depressed, severe with psychotic features ([place]) [F31.5]   . Overdose [T50.901A] 09/11/2015  . Acute respiratory failure with hypoxia ([place]) [J96.01] 09/05/2015  . Elevated troponin [R79.89] 09/05/2015  . Somnolence [R40.0] 09/05/2015  . Bipolar depression ([place]) [F31.30] 09/05/2015  . Acute bilateral deep vein thrombosis (DVT) of femoral veins ([facility]) [I82.413] 09/05/2015  . Bilateral pulmonary embolism ([place]) [I26.99] 09/02/2015  . Labile hypertension [I10]   . Pulmonary embolism with  acute cor pulmonale ([place]) [I26.09]   . Dyspnea [R06.00]   . Orthostatic hypotension [I95.1]   . Bipolar disorder, in partial remission, most recent episode depressed ([place]) [F31.75]   . Syncope, near [R55] 08/31/2015  . Affective psychosis, bipolar ([place]) [F31.9]   . Bipolar disorder, now depressed ([place]) [F31.30] 08/28/2015  . Bipolar I disorder, severe, current or most recent episode depressed, with psychotic features ([place]) [F31.5]   . Pressure ulcer [L89.90] 07/30/2015  . Protein-calorie malnutrition, severe ([place]) [E43] 07/30/2015  . Dehydration [E86.0] 07/29/2015  . Bipolar disorder, current episode depressed, severe, with psychotic features ([place]) [F31.5] 07/09/2015  . Diabetes ([place]) [E11.9] 07/09/2015  . UTI (urinary tract infection) [N39.0] 06/19/2014  . Positive blood culture [R78.81] 06/19/2014  . Bipolar affective disorder, current episode manic with psychotic symptoms ([place]) [F31.2] 06/17/2014  . Type II or unspecified type diabetes mellitus with unspecified complication, uncontrolled [E11.8, E11.65] 06/02/2014  . Other and unspecified hyperlipidemia [E78.5] 06/02/2014  . OSA on CPAP [G47.33] 06/02/2014  . 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 Gout [M10.9] 06/02/2014  . Hypertension [I10]    Total Time spent with patient: 30 minutes  Past Psychiatric History: Long history of bipolar disorder with history of suicide attempts and now a history of aggression  Past Medical History:  Past Medical History  Diagnosis Date  . Mood swings ([place])   . Type II or unspecified type diabetes mellitus with unspecified complication, uncontrolled 06/02/2014  . Other and unspecified hyperlipidemia 06/02/2014  . OSA on CPAP 06/02/2014  . 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 Bipolar disorder ([place])   . Hypertension   . Gout   . Bipolar 1  disorder ([place])   . Hx of blood clots    History reviewed. No  pertinent past surgical history. Family History:  Family History  Problem Relation Age of Onset  . Dementia Mother   . Arthritis Father   . Hypertension Father   . Heart failure Neg Hx   . Kidney failure Neg Hx   . Cancer Neg Hx   . Mental illness Sister   . Diabetes Sister    Family Psychiatric  History: Positive for bipolar disorder and anxiety Social History:  History  Alcohol Use No     History  Drug Use No    Social History   Social History  . Marital Status: Married    Spouse Name: N/A  . Number of Children: N/A  . Years of Education: N/A   Social History Main Topics  . Smoking status: Never Smoker   . Smokeless tobacco: None  . Alcohol Use: No  . Drug Use: No  . Sexual Activity: Not Currently   Other Topics Concern  . None   Social History Narrative   *The patient has been married for over 30 years and is currently on disability. He lives with his wife in the [place] area and has one daughter.             Additional Social History:                         Sleep: Poor  Appetite:  Fair  Current Medications: Current Facility-Administered Medications  Medication Dose Route Frequency Provider Last Rate Last Dose  . acetaminophen (TYLENOL) tablet 650 mg  650 mg Oral Q6H PRN Gonzella Lex, MD   650 mg at 01/15/16 2041  . allopurinol (ZYLOPRIM) tablet 100 mg  100 mg Oral Daily Loletha Grayer, MD   100 mg at 01/18/16 0950  . alum & mag hydroxide-simeth (MAALOX/MYLANTA) 200-200-20 MG/5ML suspension 30 mL  30 mL Oral Q4H PRN Gonzella Lex, MD      . apixaban (ELIQUIS) tablet 5 mg  5 mg Oral BID Gonzella Lex, MD   5 mg at 01/18/16 0852  . aspirin EC tablet 81 mg  81 mg Oral Daily Gonzella Lex, MD   81 mg at 01/18/16 0851  . atorvastatin (LIPITOR) tablet 10 mg  10 mg Oral q1800 Gonzella Lex, MD   10 mg at 01/17/16 1652  . dextrose 5 % solution   Intravenous Continuous Gonzella Lex, MD 10 mL/hr at 01/12/16 1130    . docusate sodium (COLACE)  capsule 100 mg  100 mg Oral BID Gonzella Lex, MD   100 mg at 01/18/16 0851  . feeding supplement (GLUCERNA SHAKE) (GLUCERNA SHAKE) liquid 237 mL  237 mL Oral TID BM Hildred Priest, MD   237 mL at 01/18/16 0855  . finasteride (PROSCAR) tablet 5 mg  5 mg Oral Daily Gonzella Lex, MD   5 mg at 01/18/16 0850  . insulin aspart (novoLOG) injection 0-9 Units  0-9 Units Subcutaneous TID WC Chauncey Mann, MD   2 Units at 01/17/16 1153  . magnesium hydroxide (MILK OF MAGNESIA) suspension 30 mL  30 mL Oral Daily PRN Gonzella Lex, MD   30 mL at 01/08/16 2315  . metoprolol tartrate (LOPRESSOR) tablet 12.5 mg  12.5 mg Oral BID Gonzella Lex, MD   12.5 mg at 01/18/16 0849  . mirtazapine (REMERON) tablet 45 mg  45 mg Oral QHS  Gonzella Lex, MD   45 mg at 01/17/16 2132  . multivitamin with minerals tablet 1 tablet  1 tablet Oral Daily Hildred Priest, MD   1 tablet at 01/18/16 0849  . ondansetron (ZOFRAN-ODT) disintegrating tablet 4 mg  4 mg Oral Q8H PRN Gonzella Lex, MD   4 mg at 01/09/16 1340  . ondansetron (ZOFRAN-ODT) disintegrating tablet 4 mg  4 mg Oral Q8H PRN Gonzella Lex, MD   4 mg at 01/16/16 1739  . pantoprazole (PROTONIX) EC tablet 40 mg  40 mg Oral Daily Gonzella Lex, MD   40 mg at 01/18/16 0851  . polyethylene glycol (MIRALAX / GLYCOLAX) packet 17 g  17 g Oral Daily Gonzella Lex, MD   17 g at 01/18/16 0849  . QUEtiapine (SEROQUEL) tablet 400 mg  400 mg Oral QHS Gonzella Lex, MD   400 mg at 01/17/16 2131  . tamsulosin (FLOMAX) capsule 0.4 mg  0.4 mg Oral QPC supper Gonzella Lex, MD   0.4 mg at 01/17/16 1651    Lab Results:  Results for orders placed or performed during the hospital encounter of 12/22/15 (from the past 48 hour(s))  Glucose, capillary     Status: None   Collection Time: 01/17/16  7:07 AM  Result Value Ref Range   Glucose-Capillary 89 65 - 99 mg/dL  Glucose, capillary     Status: Abnormal   Collection Time: 01/17/16 11:43 AM  Result Value Ref  Range   Glucose-Capillary 168 (H) 65 - 99 mg/dL   Comment 1 Notify RN   Glucose, capillary     Status: None   Collection Time: 01/17/16  4:33 PM  Result Value Ref Range   Glucose-Capillary 71 65 - 99 mg/dL   Comment 1 Notify RN   Glucose, capillary     Status: Abnormal   Collection Time: 01/17/16  8:28 PM  Result Value Ref Range   Glucose-Capillary 135 (H) 65 - 99 mg/dL  Comprehensive metabolic panel     Status: Abnormal   Collection Time: 01/18/16  6:18 AM  Result Value Ref Range   Sodium 135 135 - 145 mmol/L   Potassium 5.0 3.5 - 5.1 mmol/L   Chloride 99 (L) 101 - 111 mmol/L   CO2 30 22 - 32 mmol/L   Glucose, Bld 92 65 - 99 mg/dL   BUN 62 (H) 6 - 20 mg/dL   Creatinine, Ser 2.37 (H) 0.61 - 1.24 mg/dL   Calcium 9.6 8.9 - 10.3 mg/dL   Total Protein 6.6 6.5 - 8.1 g/dL   Albumin 3.7 3.5 - 5.0 g/dL   AST 11 (L) 15 - 41 U/L   ALT 11 (L) 17 - 63 U/L   Alkaline Phosphatase 77 38 - 126 U/L   Total Bilirubin 0.5 0.3 - 1.2 mg/dL   GFR calc non Af Amer 28 (L) >60 mL/min   GFR calc Af Amer 32 (L) >60 mL/min    Comment: (NOTE) The eGFR has been calculated using the CKD EPI equation. This calculation has not been validated in all clinical situations. eGFR's persistently <60 mL/min signify possible Chronic Kidney Disease.    Anion gap 6 5 - 15  Glucose, capillary     Status: None   Collection Time: 01/18/16  6:47 AM  Result Value Ref Range   Glucose-Capillary 98 65 - 99 mg/dL    Physical Findings: AIMS: Facial and Oral Movements Muscles of Facial Expression: None, normal Lips and Perioral Area: None,  normal Jaw: None, normal Tongue: None, normal,Extremity Movements Upper (arms, wrists, hands, fingers): None, normal Lower (legs, knees, ankles, toes): None, normal, Trunk Movements Neck, shoulders, hips: None, normal, Overall Severity Severity of abnormal movements (highest score from questions above): None, normal Incapacitation due to abnormal movements: None, normal Patient's  awareness of abnormal movements (rate only patient's report): No Awareness, Dental Status Current problems with teeth and/or dentures?: No Does patient usually wear dentures?: No  CIWA:    COWS:     Musculoskeletal: Strength & Muscle Tone: decreased Gait & Station: shuffle Patient leans: N/A  Psychiatric Specialty Exam: Review of Systems  HENT: Negative.   Eyes: Negative.   Respiratory: Negative.   Cardiovascular: Negative.   Gastrointestinal: Negative.   Musculoskeletal: Negative.   Skin: Negative.   Neurological: Positive for weakness.  Psychiatric/Behavioral: Positive for depression and memory loss. Negative for suicidal ideas, hallucinations and substance abuse. The patient is nervous/anxious and has insomnia.     Blood pressure 133/93, pulse 94, temperature 97.8 F (36.6 C), temperature source Oral, resp. rate 20, height 6' (1.829 m), weight 102.967 kg (227 lb), SpO2 98 %.Body mass index is 30.78 kg/(m^2).  General Appearance: Casual  Eye Contact::  Fair  Speech:  Slow  Volume:  Decreased  Mood:  Dysphoric  Affect:  Constricted  Thought Process:  Tangential  Orientation:  Full (Time, Place, and Person)  Thought Content:  Rumination  Suicidal Thoughts:  No  Homicidal Thoughts:  No  Memory:  Immediate;   Fair Recent;   Fair Remote;   Fair  Judgement:  Poor  Insight:  Fair  Psychomotor Activity:  Psychomotor Retardation  Concentration:  Fair  Recall:  [employer] of Knowledge:Fair  Language: Fair  Akathisia:  No  Handed:  Right  AIMS (if indicated):     Assets:  Desire for Improvement Resilience Social Support  ADL's:  Impaired  Cognition: Impaired,  Mild  Sleep:  Number of Hours: 8.25    Patient has now had 12 ECT treatments and has really not shown significant improvement. There have been some ups and downs and he and some slight improvement but overall he remains withdrawn and negative. I don't think that it is justified at this time for [place] to continue  ECT. I have talked with the treatment team and we are working on a referral to [facility] for perhaps longer term treatment. One new complaint today was heartburn which I have addressed by adding pantoprazole. Another new complaint today was a tremor which did look worse than previous. I have scheduled a lithium level check for Monday but in the meantime I have stopped his lithium. The level was only 0.9 but this could be one cause of his tremor. Another cause could be his lower blood sugars due to his not eating. Patient has been counseled about this and we will need to keep a close eye on his food intake. No other immediate change to his medicine.   2/18 As I mentioned above today the Lasix will be changed to when necessary EEG will be ordered to rule out seizure disorder as patient is having myoclonus Labs will be check in the morning As creatinine is elevated will push fluids today.  2/19 Today hospitalist was consulted as Creatinine is elevated. They d/c lisinopril and metformin Continue pushing fluids Will recheck BMP tomorrow---if not better IV fluids will be needed EEG is still pending  01/18/2016, 10:34 AM

## 2016-01-18 NOTE — Progress Notes (Signed)
Pt has been seclusive to his room, complaining of being tried, weak and unable to walk. Pt has been ambulating via W/C. Pt's mood and affect has been depressed. Pt noted to have some tremors . Pt not attending any unit activities. Dr Fonnie Birkenhead in to see pt. Pt continues to be pleasant and cooperative. Will encourage pt to take a shower today because he has a body odder.

## 2016-01-18 NOTE — Progress Notes (Signed)
Patient ID: Christian [name], male   DOB: 1951/11/14, 65 y.o.   MRN: [id number]  Per Junious Dresser at [facility], pt has been added to the wait list as of Saturday 01/17/2016.   Daisy Floro Adore Kithcart MSW, LCSWA  01/18/2016 9:04 AM

## 2016-01-18 NOTE — Progress Notes (Signed)
D: Observed pt in room lying in bed. Patient alert and oriented x4. Patient denies SI/HI/AVH. Pt affect is depressed. Pt rated depression 5/10 and anxiety 5/10, but pt reiterated that his mood has not improved at all. Pt refusing colace, indicating that his bowel movements are regular now. Pt still has tremors, but indicated they are worse stating "I haven't had tremors like this before." Pt isolative to room this evening. When writer went into pt's room at around 11pm, pt starting talking about "we're on a tv show...there's cameras that you can't see." Pt talked about this for a few minutes and laid down to go to sleep. Pt had not endorsed delusions or hallucinations in the past. Pt had not other complaints. A: Offered active listening and support. Provided therapeutic communication. Administered scheduled medications. Encouraged pt to be more active in the milieu and attend groups. R: Pt pleasant and cooperative. Pt medication compliant. Will continue Q15 min. checks. Safety maintained.

## 2016-01-18 NOTE — Plan of Care (Signed)
Problem: Diagnosis: Increased Risk For Suicide Attempt Goal: LTG-Patient Will Report Improvement in Psychotic Symptoms LTG (by discharge) : Patient will report improvement in psychotic symptoms.  Outcome: Not Progressing Pr reports how "we're all on a tv show" and said there were cameras around that he could see that we couldn't.

## 2016-01-19 LAB — BASIC METABOLIC PANEL
Anion gap: 4 — ABNORMAL LOW (ref 5–15)
BUN: 57 mg/dL — ABNORMAL HIGH (ref 6–20)
CO2: 30 mmol/L (ref 22–32)
Calcium: 9 mg/dL (ref 8.9–10.3)
Chloride: 98 mmol/L — ABNORMAL LOW (ref 101–111)
Creatinine, Ser: 1.87 mg/dL — ABNORMAL HIGH (ref 0.61–1.24)
GFR calc Af Amer: 43 mL/min — ABNORMAL LOW (ref >60)
GFR calc non Af Amer: 37 mL/min — ABNORMAL LOW (ref >60)
Glucose, Bld: 92 mg/dL (ref 65–99)
Potassium: 4.6 mmol/L (ref 3.5–5.1)
Sodium: 132 mmol/L — ABNORMAL LOW (ref 135–145)

## 2016-01-19 LAB — GLUCOSE, CAPILLARY
Glucose-Capillary: 148 mg/dL — ABNORMAL HIGH (ref 65–99)
Glucose-Capillary: 165 mg/dL — ABNORMAL HIGH (ref 65–99)
Glucose-Capillary: 112 mg/dL — ABNORMAL HIGH (ref 65–99)
Glucose-Capillary: 84 mg/dL (ref 65–99)
Glucose-Capillary: 121 mg/dL — ABNORMAL HIGH (ref 65–99)
Glucose-Capillary: 132 mg/dL — ABNORMAL HIGH (ref 65–99)
Glucose-Capillary: 189 mg/dL — ABNORMAL HIGH (ref 65–99)

## 2016-01-19 LAB — CBC
HCT: 37.3 % — ABNORMAL LOW (ref 40.0–52.0)
Hemoglobin: 12.4 g/dL — ABNORMAL LOW (ref 13.0–18.0)
MCH: 28.9 pg (ref 26.0–34.0)
MCHC: 33.2 g/dL (ref 32.0–36.0)
MCV: 87.1 fL (ref 80.0–100.0)
Platelets: 139 10*3/uL — ABNORMAL LOW (ref 150–440)
RBC: 4.28 MIL/uL — ABNORMAL LOW (ref 4.40–5.90)
RDW: 13.4 % (ref 11.5–14.5)
WBC: 4.5 10*3/uL (ref 3.8–10.6)

## 2016-01-19 LAB — TSH: TSH: 2.464 u[IU]/mL (ref 0.350–4.500)

## 2016-01-19 MED ORDER — METOPROLOL TARTRATE 25 MG PO TABS
12.5000 mg | ORAL_TABLET | Freq: Two times a day (BID) | ORAL | Status: DC
Start: 2016-01-19 — End: 2016-02-03
  Administered 2016-01-19 – 2016-02-03 (×28): 12.5 mg via ORAL
  Filled 2016-01-19 (×28): qty 1

## 2016-01-19 NOTE — [facility] Group Notes (Signed)
[facility] Group Notes:  (Nursing/MHT/Case Management/Adjunct)  Date:  01/19/2016  Time:  2:40 PM  Type of Therapy:  Psychoeducational Skills  Participation Level:  Did Not Attend  Christian [name] 01/19/2016, 2:40 PM

## 2016-01-19 NOTE — Progress Notes (Signed)
D: Patient still very sad and depressed. He was more talkative today than usual. He explained to me today that he did not want to take a shower because the chair in there was not doable. The chair was removed and a shower chair was placed. The paitient seems to enjoy singing. He was asked to sing and his affect brightened up after. He denies SI/HI/AVH. He also denies pain.  A: Medication was given with education. Patient was encouraged to utilize coping skills such as singing. Shower chair was placed in shower.  R: Patient was compliant with medication. He has remained calm and cooperative. Safety maintained with 15 min checks.

## 2016-01-19 NOTE — [facility] Group Notes (Signed)
[facility] LCSW Aftercare Discharge Planning Group Note   01/19/2016 10:31 AM  Participation Quality:  Patient was called to group but did not attend.   Lulu Riding, MSW, Theresia Majors 01/19/2016

## 2016-01-19 NOTE — Progress Notes (Signed)
[facility] MD Progress Note  01/19/2016 7:03 PM Christian [name]  MRN:  [id number] Subjective: follow-up as of Monday the 20th. Patient in bed today. Feeling bad. As usual unable to articulate it very well. Over the weekend he had renal insufficiency probably related to dehydration. It's a little better today. Spoke with the medicine service who saw him in consultation over the weekend. Patient remains off of lithium. He denies any acute suicidal ideation but he is eating poorly barely getting out of bed and not really doing much of anything. Principal Problem: Bipolar disorder, current episode depressed, severe, with psychotic features ([place]) Diagnosis:   Patient Active Problem List   Diagnosis Date Noted  . Bipolar I disorder, most recent episode depressed, severe w psychosis ([place]) [F31.5] 12/22/2015  . Bipolar affective disorder, depressed, severe, with psychotic behavior ([place]) [F31.5]   . Bipolar I disorder, current or most recent episode depressed, with psychotic features ([place]) [F31.5]   . Bipolar I disorder, most recent episode depressed ([place]) [F31.30]   . Severe bipolar I disorder with depression ([place]) [F31.4]   . Bipolar I disorder, most recent episode depressed, severe with psychotic features ([place]) [F31.5]   . Overdose [T50.901A] 09/11/2015  . Acute respiratory failure with hypoxia ([place]) [J96.01] 09/05/2015  . Elevated troponin [R79.89] 09/05/2015  . Somnolence [R40.0] 09/05/2015  . Bipolar depression ([employer]) [F31.30] 09/05/2015  . Acute bilateral deep vein thrombosis (DVT) of femoral veins (HCC) [I82.413] 09/05/2015  . Bilateral pulmonary embolism ([place]) [I26.99] 09/02/2015  . Labile hypertension [I10]   . Pulmonary embolism with acute cor pulmonale ([place]) [I26.09]   . Dyspnea [R06.00]   . Orthostatic hypotension [I95.1]   . Bipolar disorder, in partial remission, most recent episode depressed ([place]) [F31.75]   . Syncope, near [R55] 08/31/2015  . Affective psychosis, bipolar ([place]) [F31.9]    . Bipolar disorder, now depressed ([place]) [F31.30] 08/28/2015  . Bipolar I disorder, severe, current or most recent episode depressed, with psychotic features ([place]) [F31.5]   . Pressure ulcer [L89.90] 07/30/2015  . Protein-calorie malnutrition, severe ([place]) [E43] 07/30/2015  . Dehydration [E86.0] 07/29/2015  . Bipolar disorder, current episode depressed, severe, with psychotic features ([place]) [F31.5] 07/09/2015  . Diabetes ([place]) [E11.9] 07/09/2015  . UTI (urinary tract infection) [N39.0] 06/19/2014  . Positive blood culture [R78.81] 06/19/2014  . Bipolar affective disorder, current episode manic with psychotic symptoms ([place]) [F31.2] 06/17/2014  . Type II or unspecified type diabetes mellitus with unspecified complication, uncontrolled [E11.8, E11.65] 06/02/2014  . Other and unspecified hyperlipidemia [E78.5] 06/02/2014  . OSA on CPAP [G47.33] 06/02/2014  . 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 Gout [M10.9] 06/02/2014  . Hypertension [I10]    Total Time spent with patient: 30 minutes  Past Psychiatric History: Long history of bipolar disorder with history of suicide attempts and now a history of aggression  Past Medical History:  Past Medical History  Diagnosis Date  . Mood swings ([place])   . Type II or unspecified type diabetes mellitus with unspecified complication, uncontrolled 06/02/2014  . Other and unspecified hyperlipidemia 06/02/2014  . OSA on CPAP 06/02/2014  . 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 Bipolar disorder ([place])   . Hypertension   . Gout   . Bipolar 1 disorder ([place])   . Hx of blood clots   . Chronic kidney disease    History reviewed. No pertinent past surgical history. Family History:  Family History  Problem Relation Age of  Onset  . Dementia Mother   . Arthritis Father   . Hypertension Father   . Heart failure Neg Hx   . Kidney failure Neg Hx   . Cancer Neg Hx   .  Mental illness Sister   . Diabetes Sister    Family Psychiatric  History: Positive for bipolar disorder and anxiety Social History:  History  Alcohol Use No     History  Drug Use No    Social History   Social History  . Marital Status: Married    Spouse Name: N/A  . Number of Children: N/A  . Years of Education: N/A   Social History Main Topics  . Smoking status: Never Smoker   . Smokeless tobacco: None  . Alcohol Use: No  . Drug Use: No  . Sexual Activity: Not Currently   Other Topics Concern  . None   Social History Narrative   *The patient has been married for over 30 years and is currently on disability. He lives with his wife in the [place] area and has one daughter.             Additional Social History:                         Sleep: Poor  Appetite:  Fair  Current Medications: Current Facility-Administered Medications  Medication Dose Route Frequency Provider Last Rate Last Dose  . acetaminophen (TYLENOL) tablet 650 mg  650 mg Oral Q6H PRN Gonzella Lex, MD   650 mg at 01/18/16 2110  . allopurinol (ZYLOPRIM) tablet 100 mg  100 mg Oral Daily Loletha Grayer, MD   100 mg at 01/19/16 0737  . alum & mag hydroxide-simeth (MAALOX/MYLANTA) 200-200-20 MG/5ML suspension 30 mL  30 mL Oral Q4H PRN Gonzella Lex, MD      . apixaban (ELIQUIS) tablet 5 mg  5 mg Oral BID Gonzella Lex, MD   5 mg at 01/19/16 1062  . aspirin EC tablet 81 mg  81 mg Oral Daily Gonzella Lex, MD   81 mg at 01/19/16 6948  . atorvastatin (LIPITOR) tablet 10 mg  10 mg Oral q1800 Gonzella Lex, MD   10 mg at 01/19/16 1757  . dextrose 5 % solution   Intravenous Continuous Gonzella Lex, MD 10 mL/hr at 01/12/16 1130    . docusate sodium (COLACE) capsule 100 mg  100 mg Oral BID Gonzella Lex, MD   100 mg at 01/19/16 5462  . feeding supplement (GLUCERNA SHAKE) (GLUCERNA SHAKE) liquid 237 mL  237 mL Oral TID BM Hildred Priest, MD   237 mL at 01/19/16 1400  . finasteride  (PROSCAR) tablet 5 mg  5 mg Oral Daily Gonzella Lex, MD   5 mg at 01/19/16 7035  . insulin aspart (novoLOG) injection 0-9 Units  0-9 Units Subcutaneous TID WC Chauncey Mann, MD   1 Units at 01/19/16 1757  . magnesium hydroxide (MILK OF MAGNESIA) suspension 30 mL  30 mL Oral Daily PRN Gonzella Lex, MD   30 mL at 01/08/16 2315  . metoprolol tartrate (LOPRESSOR) tablet 12.5 mg  12.5 mg Oral BID Loletha Grayer, MD      . mirtazapine (REMERON) tablet 45 mg  45 mg Oral QHS Gonzella Lex, MD   45 mg at 01/18/16 2109  . multivitamin with minerals tablet 1 tablet  1 tablet Oral Daily Hildred Priest, MD   1 tablet at 01/19/16  4580  . ondansetron (ZOFRAN-ODT) disintegrating tablet 4 mg  4 mg Oral Q8H PRN Gonzella Lex, MD   4 mg at 01/09/16 1340  . ondansetron (ZOFRAN-ODT) disintegrating tablet 4 mg  4 mg Oral Q8H PRN Gonzella Lex, MD   4 mg at 01/16/16 1739  . pantoprazole (PROTONIX) EC tablet 40 mg  40 mg Oral Daily Gonzella Lex, MD   40 mg at 01/19/16 0617  . polyethylene glycol (MIRALAX / GLYCOLAX) packet 17 g  17 g Oral Daily Gonzella Lex, MD   17 g at 01/18/16 0849  . QUEtiapine (SEROQUEL) tablet 400 mg  400 mg Oral QHS Gonzella Lex, MD   400 mg at 01/18/16 2109  . tamsulosin (FLOMAX) capsule 0.4 mg  0.4 mg Oral QPC supper Gonzella Lex, MD   0.4 mg at 01/19/16 1757    Lab Results:  Results for orders placed or performed during the hospital encounter of 12/22/15 (from the past 48 hour(s))  Glucose, capillary     Status: Abnormal   Collection Time: 01/17/16  8:28 PM  Result Value Ref Range   Glucose-Capillary 135 (H) 65 - 99 mg/dL  Comprehensive metabolic panel     Status: Abnormal   Collection Time: 01/18/16  6:18 AM  Result Value Ref Range   Sodium 135 135 - 145 mmol/L   Potassium 5.0 3.5 - 5.1 mmol/L   Chloride 99 (L) 101 - 111 mmol/L   CO2 30 22 - 32 mmol/L   Glucose, Bld 92 65 - 99 mg/dL   BUN 62 (H) 6 - 20 mg/dL   Creatinine, Ser 2.37 (H) 0.61 - 1.24 mg/dL    Calcium 9.6 8.9 - 10.3 mg/dL   Total Protein 6.6 6.5 - 8.1 g/dL   Albumin 3.7 3.5 - 5.0 g/dL   AST 11 (L) 15 - 41 U/L   ALT 11 (L) 17 - 63 U/L   Alkaline Phosphatase 77 38 - 126 U/L   Total Bilirubin 0.5 0.3 - 1.2 mg/dL   GFR calc non Af Amer 28 (L) >60 mL/min   GFR calc Af Amer 32 (L) >60 mL/min    Comment: (NOTE) The eGFR has been calculated using the CKD EPI equation. This calculation has not been validated in all clinical situations. eGFR's persistently <60 mL/min signify possible Chronic Kidney Disease.    Anion gap 6 5 - 15  Glucose, capillary     Status: None   Collection Time: 01/18/16  6:47 AM  Result Value Ref Range   Glucose-Capillary 98 65 - 99 mg/dL  Glucose, capillary     Status: Abnormal   Collection Time: 01/18/16 11:55 AM  Result Value Ref Range   Glucose-Capillary 145 (H) 65 - 99 mg/dL  Glucose, capillary     Status: Abnormal   Collection Time: 01/18/16  4:33 PM  Result Value Ref Range   Glucose-Capillary 111 (H) 65 - 99 mg/dL  Glucose, capillary     Status: Abnormal   Collection Time: 01/18/16  8:53 PM  Result Value Ref Range   Glucose-Capillary 190 (H) 65 - 99 mg/dL   Comment 1 Notify RN   Glucose, capillary     Status: None   Collection Time: 01/19/16  6:53 AM  Result Value Ref Range   Glucose-Capillary 84 65 - 99 mg/dL  CBC     Status: Abnormal   Collection Time: 01/19/16  6:57 AM  Result Value Ref Range   WBC 4.5 3.8 - 10.6 K/uL  RBC 4.28 (L) 4.40 - 5.90 MIL/uL   Hemoglobin 12.4 (L) 13.0 - 18.0 g/dL   HCT 37.3 (L) 40.0 - 52.0 %   MCV 87.1 80.0 - 100.0 fL   MCH 28.9 26.0 - 34.0 pg   MCHC 33.2 32.0 - 36.0 g/dL   RDW 13.4 11.5 - 14.5 %   Platelets 139 (L) 150 - 440 K/uL  Basic metabolic panel     Status: Abnormal   Collection Time: 01/19/16  6:57 AM  Result Value Ref Range   Sodium 132 (L) 135 - 145 mmol/L   Potassium 4.6 3.5 - 5.1 mmol/L   Chloride 98 (L) 101 - 111 mmol/L   CO2 30 22 - 32 mmol/L   Glucose, Bld 92 65 - 99 mg/dL   BUN 57  (H) 6 - 20 mg/dL   Creatinine, Ser 1.87 (H) 0.61 - 1.24 mg/dL   Calcium 9.0 8.9 - 10.3 mg/dL   GFR calc non Af Amer 37 (L) >60 mL/min   GFR calc Af Amer 43 (L) >60 mL/min    Comment: (NOTE) The eGFR has been calculated using the CKD EPI equation. This calculation has not been validated in all clinical situations. eGFR's persistently <60 mL/min signify possible Chronic Kidney Disease.    Anion gap 4 (L) 5 - 15  TSH     Status: None   Collection Time: 01/19/16  6:57 AM  Result Value Ref Range   TSH 2.464 0.350 - 4.500 uIU/mL  Glucose, capillary     Status: Abnormal   Collection Time: 01/19/16 11:41 AM  Result Value Ref Range   Glucose-Capillary 121 (H) 65 - 99 mg/dL  Glucose, capillary     Status: Abnormal   Collection Time: 01/19/16  5:07 PM  Result Value Ref Range   Glucose-Capillary 132 (H) 65 - 99 mg/dL    Physical Findings: AIMS: Facial and Oral Movements Muscles of Facial Expression: None, normal Lips and Perioral Area: None, normal Jaw: None, normal Tongue: None, normal,Extremity Movements Upper (arms, wrists, hands, fingers): None, normal Lower (legs, knees, ankles, toes): None, normal, Trunk Movements Neck, shoulders, hips: None, normal, Overall Severity Severity of abnormal movements (highest score from questions above): None, normal Incapacitation due to abnormal movements: None, normal Patient's awareness of abnormal movements (rate only patient's report): No Awareness, Dental Status Current problems with teeth and/or dentures?: No Does patient usually wear dentures?: No  CIWA:    COWS:     Musculoskeletal: Strength & Muscle Tone: decreased Gait & Station: shuffle Patient leans: N/A  Psychiatric Specialty Exam: Review of Systems  HENT: Negative.   Eyes: Negative.   Respiratory: Negative.   Cardiovascular: Negative.   Gastrointestinal: Negative.   Musculoskeletal: Negative.   Skin: Negative.   Neurological: Positive for weakness.   Psychiatric/Behavioral: Positive for depression and memory loss. Negative for suicidal ideas, hallucinations and substance abuse. The patient is nervous/anxious and has insomnia.     Blood pressure 97/56, pulse 80, temperature 98.1 F (36.7 C), temperature source Oral, resp. rate 13, height 6' (1.829 m), weight 102.967 kg (227 lb), SpO2 98 %.Body mass index is 30.78 kg/(m^2).  General Appearance: Casual  Eye Contact::  Fair  Speech:  Slow  Volume:  Decreased  Mood:  Dysphoric  Affect:  Constricted  Thought Process:  Tangential  Orientation:  Full (Time, Place, and Person)  Thought Content:  Rumination  Suicidal Thoughts:  No  Homicidal Thoughts:  No  Memory:  Immediate;   Fair Recent;   Fair Remote;  Fair  Judgement:  Poor  Insight:  Fair  Psychomotor Activity:  Psychomotor Retardation  Concentration:  Fair  Recall:  [employer] of Knowledge:Fair  Language: Fair  Akathisia:  No  Handed:  Right  AIMS (if indicated):     Assets:  Desire for Improvement Resilience Social Support  ADL's:  Impaired  Cognition: Impaired,  Mild  Sleep:  Number of Hours: 8.5   Patient has now completed 12 ECT treatments in a row with minimal benefit. Remains very depressed and withdrawn and inactive. Does not appear to be responding to current treatment. Psychiatrically we are working on referral to longer-term hospital. Spoke with medicine and we will continue to keep an eye on all of his vital signs blood sugar in lab values. No other change to medicine today. 01/19/2016, 7:03 PM

## 2016-01-19 NOTE — Evaluation (Signed)
Physical Therapy Evaluation Patient Details Name: Christian [name] MRN: [id number] DOB: 1951-09-28 Today's Date: 01/19/2016   History of Present Illness  Pt is a 65 y.o. male with PMH of bipolar disorder, mood swings, bilateral LE edema, diabetes, HTN B PE's, B LE DVT's, orthostatic hypotension, and cardiac arrhythmia.  Pt also with extended h/o ECT treatments.  Pt presented with mood swings and SOB.  Pt noted to have developed jerking in arms over last week.  Pt admitted to hospital 12/22/15.    Clinical Impression  Prior to admission pt was independent with occasional use of single point cane.  Pt lives with wife.  Pt was independent with bed mobility and modified independent (RW) with sit to stand.  Pt performed in bed and EOB exercises.  Pt ambulated 2 sets of 120 feet with RW.  Pt became tired on second ambulation set.  During turning and stand to sit, pt's knees buckled but then pt self-corrected and maintained balance.  Due to aforementioned function and strength deficits, pt is in need of skilled physical therapy.  It is recommended that pt is discharged to home with home health PT when medically appropriate.     Follow Up Recommendations Home health PT    Equipment Recommendations  Rolling walker with 5" wheels    Recommendations for Other Services       Precautions / Restrictions Precautions Precautions: Fall Restrictions Weight Bearing Restrictions: No      Mobility  Bed Mobility Overal bed mobility: Independent                Transfers Overall transfer level: Needs assistance Equipment used: Rolling walker (2 wheeled) Transfers: Sit to/from Stand Sit to Stand: Min guard            Ambulation/Gait Ambulation/Gait assistance: Min guard Ambulation Distance (Feet):  (2x120) Assistive device: Rolling walker (2 wheeled) Gait Pattern/deviations: Step-through pattern;Steppage Gait velocity: decreased    Required occasional verbal cues for feet placement.   Pt demonstrated B UE tremor with walker use during ambulation.  Stairs            Wheelchair Mobility    Modified Rankin (Stroke Patients Only)       Balance Overall balance assessment: Needs assistance Sitting-balance support: Feet supported Sitting balance-Leahy Scale: Normal     Standing balance support: Bilateral upper extremity supported (RW) Standing balance-Leahy Scale: Good    Increased tremors in UE affecting standing balance with walker use.                           Pertinent Vitals/Pain Pain Assessment: No/denies pain    Home Living Family/patient expects to be discharged to:: Private residence Living Arrangements: Spouse/significant other Available Help at Discharge: Family   Home Access: Stairs to enter Entrance Stairs-Rails: Right Entrance Stairs-Number of Steps: 4 Home Layout: One level Home Equipment: None      Prior Function Level of Independence: Independent               Hand Dominance        Extremity/Trunk Assessment   Upper Extremity Assessment: Overall WFL for tasks assessed           Lower Extremity Assessment: Generalized weakness      Cervical / Trunk Assessment: Normal  Communication   Communication: No difficulties  Cognition Arousal/Alertness: Awake/alert Behavior During Therapy: WFL for tasks assessed/performed Overall Cognitive Status: Within Functional Limits for tasks assessed  General Comments   Nursing was contacted and cleared pt for physical therapy.  Pt was agreeable and tolerated session well.     Exercises Total Joint Exercises Ankle Circles/Pumps: AROM;Both;20 reps Hip ABduction/ADduction: AROM;Both;20 reps Long Arc Quad: AROM;Both;20 reps General Exercises - Lower Extremity Hip Flexion/Marching: AROM;Both;20 reps (sitting on EOB) Toe Raises: AROM;Both;20 reps Heel Raises: AROM;Both;20 reps  Ex's performed in sitting.      Assessment/Plan     PT Assessment Patient needs continued PT services  PT Diagnosis Generalized weakness   PT Problem List Decreased strength;Decreased activity tolerance;Decreased balance;Decreased mobility;Decreased coordination;Decreased knowledge of use of DME  PT Treatment Interventions DME instruction;Gait training;Stair training;Functional mobility training;Therapeutic activities;Therapeutic exercise;Balance training;Patient/family education;Neuromuscular re-education   PT Goals (Current goals can be found in the Care Plan section) Acute Rehab PT Goals Patient Stated Goal: to go home  PT Goal Formulation: With patient Time For Goal Achievement: 02/02/16 Potential to Achieve Goals: Good    Frequency Min 2X/week   Barriers to discharge        Co-evaluation               End of Session Equipment Utilized During Treatment: Gait belt Activity Tolerance: Patient tolerated treatment well Patient left: in bed           Time: 2956-2130 PT Time Calculation (min) (ACUTE ONLY): 23 min   Charges:         PT G Codes:       Lyndel Safe, SPT Lyndel Safe 01/19/2016, 1:57 PM

## 2016-01-19 NOTE — Progress Notes (Signed)
Patient ID: EFE FAZZINO, male   DOB: Apr 16, 1951, 65 y.o.   MRN: [id number] Christian [name] PROGRESS NOTE  Christian [name] Clare WUJ:[id number] DOB: 1951/03/15 DOA: 12/22/2015 PCP: Christian Rubenstein, MD  HPI/Subjective: Patient states that he's been eating well and drinking well. His major complaint is the tremor on his arms. In speaking with the nursing staff, she said he's had that during this hospital course. In speaking with Dr. Delaney Meigs, he stated that this is more recent and he held the lithium over the weekend and does not believe that this is the cause.  Objective: Filed Vitals:   01/19/16 0605 01/19/16 0734  BP: 94/55 97/56  Pulse: 80   Temp: 98.1 F (36.7 C)   Resp: 13     Filed Weights   01/09/16 0827 01/12/16 1103 01/16/16 0836  Weight: 102.967 kg (227 lb) 104.327 kg (230 lb) 102.967 kg (227 lb)    ROS: Review of Systems  Constitutional: Negative for fever and chills.  Eyes: Negative for blurred vision.  Respiratory: Negative for cough and shortness of breath.   Cardiovascular: Negative for chest pain.  Gastrointestinal: Negative for nausea, vomiting, abdominal pain, diarrhea and constipation.  Genitourinary: Negative for dysuria.  Musculoskeletal: Negative for joint pain.  Neurological: Positive for tremors. Negative for dizziness and headaches.   Exam: Physical Exam  HENT:  Nose: No mucosal edema.  Mouth/Throat: No oropharyngeal exudate or posterior oropharyngeal edema.  Eyes: Conjunctivae, EOM and lids are normal. Pupils are equal, round, and reactive to light.  Neck: No JVD present. Carotid bruit is not present. No edema present. No thyroid mass and no thyromegaly present.  Cardiovascular: S1 normal and S2 normal.  Exam reveals no gallop.   No murmur heard. Pulses:      Dorsalis pedis pulses are 2+ on the right side, and 2+ on the left side.  Respiratory: No respiratory distress. He has no wheezes. He has no rhonchi. He has no rales.  GI: Soft. Bowel  sounds are normal. There is no tenderness.  Musculoskeletal:       Right ankle: He exhibits swelling.       Left ankle: He exhibits swelling.  Patient with some slight discomfort at the right biceps tendon insertion. He is able to move his shoulder around without pain.  Lymphadenopathy:    He has no cervical adenopathy.  Neurological: He is alert. No cranial nerve deficit.  Positive tremor bilateral upper extremities slightly less than yesterday.  Skin: Skin is warm. No rash noted. Nails show no clubbing.  Psychiatric: He has a normal mood and affect.      Data Reviewed: Basic Metabolic Panel:  Recent Labs Lab 01/13/16 0644 01/15/16 0707 01/16/16 0655 01/18/16 0618 01/19/16 0657  NA  --  134*  --  135 132*  K  --  4.2  --  5.0 4.6  CL  --  101  --  99* 98*  CO2  --  27  --  30 30  GLUCOSE  --  95  --  92 92  BUN  --  48*  --  62* 57*  CREATININE 2.06* 1.41* 1.63* 2.37* 1.87*  CALCIUM  --  9.6  --  9.6 9.0   Liver Function Tests:  Recent Labs Lab 01/18/16 0618  AST 11*  ALT 11*  ALKPHOS 77  BILITOT 0.5  PROT 6.6  ALBUMIN 3.7   CBC:  Recent Labs Lab 01/13/16 0644 01/16/16 0655 01/19/16 0657  WBC 5.9 4.5 4.5  HGB  14.7 13.8 12.4*  HCT 45.1 41.5 37.3*  MCV 88.0 89.1 87.1  PLT 164 152 139*    CBG:  Recent Labs Lab 01/18/16 1155 01/18/16 1633 01/18/16 2053 01/19/16 0653 01/19/16 1141  GLUCAP 145* 111* 190* 84 121*    Scheduled Meds: . allopurinol  100 mg Oral Daily  . apixaban  5 mg Oral BID  . aspirin EC  81 mg Oral Daily  . atorvastatin  10 mg Oral q1800  . docusate sodium  100 mg Oral BID  . feeding supplement (GLUCERNA SHAKE)  237 mL Oral TID BM  . finasteride  5 mg Oral Daily  . insulin aspart  0-9 Units Subcutaneous TID WC  . metoprolol tartrate  12.5 mg Oral BID  . mirtazapine  45 mg Oral QHS  . multivitamin with minerals  1 tablet Oral Daily  . pantoprazole  40 mg Oral Daily  . polyethylene glycol  17 g Oral Daily  . QUEtiapine   400 mg Oral QHS  . tamsulosin  0.4 mg Oral QPC supper    Assessment/Plan:  1. Acute renal failure on chronic kidney disease stage III. Patient's creatinine is improved today. I still encouraged and nursing staff to continue pushing oral fluids. I will check a creatinine in the a.m. I held the patient's Lasix and lisinopril. I renally dosed allopurinol. I stopped Glucophage. 2. Tremor upper extremities. Case discussed with Dr. Delaney Meigs psychiatrist and he does not think it would be due to the lithium but the lithium was held over the weekend. I will get consult from neurology for evaluation. 3. Essential hypertension- last blood pressure is on the lower side. I will put holding parameters on the metoprolol. 4. Type 2 diabetes mellitus. Hold Glucophage at this time. Continue to watch blood sugars. 5. History of DVT on eloquent 6. BPH continue finasteride and Flomax. 7. Weakness- physical therapy evaluation  Code Status:     Code Status Orders        Start     Ordered   12/22/15 1338  Full code   Continuous     12/22/15 1337    Code Status History    Date Active Date Inactive Code Status Order ID Comments User Context   12/22/2015  8:30 AM 12/22/2015  1:37 PM Full Code [id number]  Audery Amel, MD ED     Disposition: As per psychiatry  Consultants:  Neurology  Time spent:35 minutes   Alford Highland  [facility]

## 2016-01-19 NOTE — Plan of Care (Signed)
Problem: Diagnosis: Increased Risk For Suicide Attempt Goal: LTG-Patient Will Show Positive Response to Medication LTG (by discharge) : Patient will show positive response to medication and will participate in the development of the discharge plan.  Outcome: Not Progressing Patient is still showing increased depression

## 2016-01-19 NOTE — Progress Notes (Signed)
D:  Continues to endorses depression.  Denies SI this shift.  Refuses to maintain personal care chores. A:  Safety maintained.  Support and encouragement offered.  Medications administered per orders. R:  Medication compliant.

## 2016-01-19 NOTE — Plan of Care (Signed)
Problem: Alteration in mood Goal: LTG-Pt's behavior demonstrates decreased signs of depression (Patient's behavior demonstrates decreased signs of depression to the point the patient is safe to return home and continue treatment in an outpatient setting)  Outcome: Not Progressing Presents with sad flat affect.  Rates depression as a 7.  Stays in his room in dark sleeping.  Only gets up for meals.  Refuses to take a shower or perform any ADL's.

## 2016-01-19 NOTE — Plan of Care (Signed)
Problem: Ineffective individual coping Goal: STG:Pt. will utilize relaxation techniques to reduce stress STG: Patient will utilize relaxation techniques to reduce stress levels  Outcome: Not Progressing Patient stays in room and forwards little.

## 2016-01-19 NOTE — Progress Notes (Signed)
Nutrition Follow-up    INTERVENTION:   Meals and Snacks: encourage menu completion to best meet pt preferences; encourage fluid intake  NUTRITION DIAGNOSIS:   Inadequate oral intake related to acute illness as evidenced by energy intake < 75% for > 7 days.  GOAL:   Patient will meet greater than or equal to 90% of their needs  MONITOR:    (Energy intake, Glucose profile)  REASON FOR ASSESSMENT:   LOS    ASSESSMENT:    Hospitalist consulted for acute on CRF (CKD III), pt with tremors and neurology consulted   Diet Order:  Diet regular Room service appropriate?: Yes; Fluid consistency:: Thin   Energy Intake: per report, pt has been eating well; recorded po intake 87% on average   Recent Labs Lab 01/15/16 0707 01/16/16 0655 01/18/16 0618 01/19/16 0657  NA 134*  --  135 132*  K 4.2  --  5.0 4.6  CL 101  --  99* 98*  CO2 27  --  30 30  BUN 48*  --  62* 57*  CREATININE 1.41* 1.63* 2.37* 1.87*  CALCIUM 9.6  --  9.6 9.0  GLUCOSE 95  --  92 92    Glucose Profile:  Recent Labs  01/18/16 2053 01/19/16 0653 01/19/16 1141  GLUCAP 190* 84 121*   Meds: ss novolog, remeron, MVI Height:   Ht Readings from Last 1 Encounters:  01/16/16 6' (1.829 m)    Weight:   Wt Readings from Last 1 Encounters:  01/16/16 227 lb (102.967 kg)    Filed Weights   01/09/16 0827 01/12/16 1103 01/16/16 0836  Weight: 227 lb (102.967 kg) 230 lb (104.327 kg) 227 lb (102.967 kg)    BMI:  Body mass index is 30.78 kg/(m^2).  Estimated Nutritional Needs:   Kcal:  (25-30 kcals/kg)2000-2400 kcals/d (Using IBW of 80kg)  Protein:  0.8-1.0 g/kg) 64-80 gm/d  Fluid:  (2000-2441ml/d  EDUCATION NEEDS:   No education needs identified at this time  LOW Care Level  Romelle Starcher MS, RD, LDN [phone] Pager  [phone] Weekend/On-Call Pager

## 2016-01-19 NOTE — [facility] Group Notes (Signed)
[facility] LCSW Group Therapy  01/19/2016 1:59 PM  Type of Therapy:  Group Therapy  Participation Level:  Did Not Attend  Summary of Progress/Problems: Patient was called to group but did not attend   Lulu Riding, MSW, LCSWA 01/19/2016, 1:59 PM

## 2016-01-19 NOTE — Progress Notes (Signed)
D: Patient still appears flat and depressed. He was talking about discharge plans and he verbalizes he does not want to go to [facility]. He rates his depression at a 5 this evening. He denies SI/HI/AVH. He verbalizes pain in his right shoulder.  A: Medications was given with education. Encouragement was provided. Instructions were given about ECT.  R: Patient was compliant with medication. He has remained calm and cooperative. Safety maintained with 15 min checks.

## 2016-01-19 NOTE — Progress Notes (Signed)
Recreation Therapy Notes  Date: 02.20.17 Time: 3:00 pm Location: Community Room  Group Topic: Self-expression  Goal Area(s) Addresses:  Patient will be able to identify a color that represents each emotion. Patient will verbalize benefit of using art as a means of self-expression. Patient will verbalize one positive emotion experienced while participating in activity.  Behavioral Response: Did not attend  Intervention: The Colors Within Me  Activity: Patients were given a blank face worksheet and instructed to pick a color for each emotion they were experiencing and show on the worksheet how much of that emotion they were experiencing.  Education: LRT educated patients on different forms of self-expression.  Education Outcome: Patient did not attend group.  Clinical Observations/Feedback: Patient did not attend group.  Jacquelynn Cree, LRT/CTRS 01/19/2016 4:11 PM

## 2016-01-20 DIAGNOSIS — R251 Tremor, unspecified: Secondary | ICD-10-CM

## 2016-01-20 LAB — GLUCOSE, CAPILLARY
Glucose-Capillary: 132 mg/dL — ABNORMAL HIGH (ref 65–99)
Glucose-Capillary: 146 mg/dL — ABNORMAL HIGH (ref 65–99)
Glucose-Capillary: 124 mg/dL — ABNORMAL HIGH (ref 65–99)
Glucose-Capillary: 178 mg/dL — ABNORMAL HIGH (ref 65–99)
Glucose-Capillary: 135 mg/dL — ABNORMAL HIGH (ref 65–99)
Glucose-Capillary: 146 mg/dL — ABNORMAL HIGH (ref 65–99)
Glucose-Capillary: 188 mg/dL — ABNORMAL HIGH (ref 65–99)
Glucose-Capillary: 110 mg/dL — ABNORMAL HIGH (ref 65–99)
Glucose-Capillary: 133 mg/dL — ABNORMAL HIGH (ref 65–99)
Glucose-Capillary: 125 mg/dL — ABNORMAL HIGH (ref 65–99)
Glucose-Capillary: 148 mg/dL — ABNORMAL HIGH (ref 65–99)
Glucose-Capillary: 168 mg/dL — ABNORMAL HIGH (ref 65–99)
Glucose-Capillary: 100 mg/dL — ABNORMAL HIGH (ref 65–99)
Glucose-Capillary: 212 mg/dL — ABNORMAL HIGH (ref 65–99)
Glucose-Capillary: 131 mg/dL — ABNORMAL HIGH (ref 65–99)
Glucose-Capillary: 165 mg/dL — ABNORMAL HIGH (ref 65–99)

## 2016-01-20 LAB — BASIC METABOLIC PANEL
Anion gap: 4 — ABNORMAL LOW (ref 5–15)
BUN: 45 mg/dL — ABNORMAL HIGH (ref 6–20)
CO2: 31 mmol/L (ref 22–32)
Calcium: 9.3 mg/dL (ref 8.9–10.3)
Chloride: 100 mmol/L — ABNORMAL LOW (ref 101–111)
Creatinine, Ser: 1.45 mg/dL — ABNORMAL HIGH (ref 0.61–1.24)
GFR calc Af Amer: 58 mL/min — ABNORMAL LOW (ref >60)
GFR calc non Af Amer: 50 mL/min — ABNORMAL LOW (ref >60)
Glucose, Bld: 110 mg/dL — ABNORMAL HIGH (ref 65–99)
Potassium: 4.4 mmol/L (ref 3.5–5.1)
Sodium: 135 mmol/L (ref 135–145)

## 2016-01-20 MED ORDER — METFORMIN HCL ER 500 MG PO TB24
500.0000 mg | ORAL_TABLET | Freq: Two times a day (BID) | ORAL | Status: DC
  Administered 2016-01-20 – 2016-02-03 (×28): 500 mg via ORAL
  Filled 2016-01-20 (×30): qty 1

## 2016-01-20 NOTE — [facility] Group Notes (Signed)
[facility] LCSW Group Therapy  01/20/2016 2:21 PM  Type of Therapy:  Group Therapy  Participation Level:  Did Not Attend  Summary of Progress/Problems: Patient was called to group but did not attend.  Lulu Riding, MSW, LCSWA  01/20/2016, 2:21 PM

## 2016-01-20 NOTE — [facility] Group Notes (Signed)
[facility] Group Notes:  (Nursing/MHT/Case Management/Adjunct)  Date:  01/20/2016  Time:  11:55 PM  Type of Therapy:  Group Therapy  Participation Level:  Did Not Attend    Summary of Progress/Problems:  Christian [name] 01/20/2016, 11:55 PM

## 2016-01-20 NOTE — Plan of Care (Signed)
Problem: Alteration in mood Goal: STG-Patient reports thoughts of self-harm to staff Outcome: Progressing Denies wanting to harm  himself

## 2016-01-20 NOTE — Consult Note (Signed)
Reason for Consult:Tremor Referring Physician: Renae Gloss  CC: Tremor  HPI: Christian [name] is an 65 y.o. male with a history of BPD and antipsychotic use who has complaints of tremor.  Reportedly tremor was bad enough that it was interfering with ADL's.  Lithium held and although tremor not completely resolved does appear to have improved.  Remains on Seroquel.    Past Medical History  Diagnosis Date  . Mood swings (HCC)   . Type II or unspecified type diabetes mellitus with unspecified complication, uncontrolled 06/02/2014  . Other and unspecified hyperlipidemia 06/02/2014  . OSA on CPAP 06/02/2014  . 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 Bipolar disorder (HCC)   . Hypertension   . Gout   . Bipolar 1 disorder (HCC)   . Hx of blood clots   . Chronic kidney disease     History reviewed. No pertinent past surgical history.  Family History  Problem Relation Age of Onset  . Dementia Mother   . Arthritis Father   . Hypertension Father   . Heart failure Neg Hx   . Kidney failure Neg Hx   . Cancer Neg Hx   . Mental illness Sister   . Diabetes Sister     Social History:  reports that he has never smoked. He does not have any smokeless tobacco history on file. He reports that he does not drink alcohol or use illicit drugs.  No Known Allergies  Medications:  I have reviewed the patient's current medications. Prior to Admission:  Prescriptions prior to admission  Medication Sig Dispense Refill Last Dose  . allopurinol (ZYLOPRIM) 300 MG tablet Take 1 tablet (300 mg total) by mouth daily. 30 tablet 0 unknown  . apixaban (ELIQUIS) 5 MG TABS tablet Take 1 tablet (5 mg total) by mouth 2 (two) times daily. 60 tablet 0 unknown  . atorvastatin (LIPITOR) 10 MG tablet Take 1 tablet (10 mg total) by mouth daily at 6 PM. 30 tablet 0 unknown  . clonazePAM (KLONOPIN) 1 MG tablet Take 1 tablet (1 mg total) by mouth at bedtime. 30 tablet 0 unknown  .  finasteride (PROSCAR) 5 MG tablet Take 1 tablet (5 mg total) by mouth daily. 30 tablet 0 unknown  . furosemide (LASIX) 20 MG tablet Take 20 mg by mouth daily.    01/14/2016 at 0647  . lisinopril (PRINIVIL,ZESTRIL) 40 MG tablet Take 40 mg by mouth daily.   01/16/2016 at 0803  . metFORMIN (GLUCOPHAGE-XR) 500 MG 24 hr tablet Take 500 mg by mouth 2 (two) times daily.   01/16/2016 at 0657  . mirtazapine (REMERON) 45 MG tablet Take 1 tablet (45 mg total) by mouth at bedtime. 30 tablet 0 unknown  . OLANZapine (ZYPREXA) 15 MG tablet Take 2 tablets (30 mg total) by mouth at bedtime. 60 tablet 0 unknown  . tamsulosin (FLOMAX) 0.4 MG CAPS capsule Take 1 capsule (0.4 mg total) by mouth daily after supper. 30 capsule 0 unknown  . aspirin EC 81 MG EC tablet Take 1 tablet (81 mg total) by mouth daily. (Patient not taking: Reported on 12/29/2015) 30 tablet 0 12/16/2015 at Unknown time  . docusate sodium (COLACE) 100 MG capsule Take 2 capsules (200 mg total) by mouth 2 (two) times daily. (Patient not taking: Reported on 12/29/2015) 60 capsule 0 Past Week at Unknown time  . polyethylene glycol (MIRALAX / GLYCOLAX) packet Take 17 g by mouth daily. (Patient not taking: Reported on 12/29/2015) 30 each 0 Past  Month at Unknown time   Scheduled: . allopurinol  100 mg Oral Daily  . apixaban  5 mg Oral BID  . aspirin EC  81 mg Oral Daily  . atorvastatin  10 mg Oral q1800  . docusate sodium  100 mg Oral BID  . feeding supplement (GLUCERNA SHAKE)  237 mL Oral TID BM  . finasteride  5 mg Oral Daily  . insulin aspart  0-9 Units Subcutaneous TID WC  . metoprolol tartrate  12.5 mg Oral BID  . mirtazapine  45 mg Oral QHS  . multivitamin with minerals  1 tablet Oral Daily  . pantoprazole  40 mg Oral Daily  . polyethylene glycol  17 g Oral Daily  . QUEtiapine  400 mg Oral QHS  . tamsulosin  0.4 mg Oral QPC supper    ROS: History obtained from the patient  General ROS: negative for - chills, fatigue, fever, night sweats,  weight gain or weight loss Ophthalmic ROS: negative for - blurry vision, double vision, eye pain or loss of vision ENT ROS: negative for - epistaxis, nasal discharge, oral lesions, sore throat, tinnitus or vertigo Allergy and Immunology ROS: negative for - hives or itchy/watery eyes Hematological and Lymphatic ROS: negative for - bleeding problems, bruising or swollen lymph nodes Endocrine ROS: negative for - galactorrhea, hair pattern changes, polydipsia/polyuria or temperature intolerance Respiratory ROS: negative for - cough, hemoptysis, shortness of breath or wheezing Cardiovascular ROS: negative for - chest pain, dyspnea on exertion, edema or irregular heartbeat Gastrointestinal ROS: negative for - abdominal pain, diarrhea, hematemesis, nausea/vomiting or stool incontinence Genito-Urinary ROS: negative for - dysuria, hematuria, incontinence or urinary frequency/urgency Musculoskeletal ROS: shoulder pain Neurological ROS: as noted in HPI Dermatological ROS: negative for rash and skin lesion changes  Physical Examination: Blood pressure 135/94, pulse 90, temperature 98.2 F (36.8 C), temperature source Oral, resp. rate 18, height 6' (1.829 m), weight 102.967 kg (227 lb), SpO2 98 %.  HEENT-  Normocephalic, no lesions, without obvious abnormality.  Normal external eye and conjunctiva.  Normal TM's bilaterally.  Normal auditory canals and external ears. Normal external nose, mucus membranes and septum.  Normal pharynx. Cardiovascular- S1, S2 normal, pulses palpable throughout   Lungs- chest clear, no wheezing, rales, normal symmetric air entry Abdomen- soft, non-tender; bowel sounds normal; no masses,  no organomegaly Extremities- no edema Lymph-no adenopathy palpable Musculoskeletal-no joint tenderness, deformity or swelling Skin-warm and dry, no hyperpigmentation, vitiligo, or suspicious lesions  Neurological Examination Mental Status: Alert, oriented, thought content appropriate.   Speech fluent without evidence of aphasia.  Able to follow 3 step commands without difficulty. Cranial Nerves: II: Discs flat bilaterally; Visual fields grossly normal, pupils equal, round, reactive to light and accommodation III,IV, VI: ptosis not present, extra-ocular motions intact bilaterally V,VII: mild facial asymmetry, facial light touch sensation normal bilaterally VIII: hearing normal bilaterally IX,X: gag reflex present XI: bilateral shoulder shrug XII: midline tongue extension Motor: Right : Upper extremity   5/5    Left:     Upper extremity   5/5 Limited ROM in the upper extremities due to pain at the shoulders  Lower extremity   5/5     Lower extremity   5/5 Mildly increased tone in the UE's noted with reinforcement.   Sensory: Pinprick and light touch intact throughout, bilaterally Deep Tendon Reflexes: 2+ in the UE's, 1+ at the knees and absent at the ankles Plantars: Right: downgoing   Left: downgoing Cerebellar: Normal finger-to-nose and normal heel-to-shin testing bilaterally. Coarse tremor  noted in the upper extremities.  Not present in sleep.  No present at rest.  Not present when distracted.  Patient able to write name legibly.  No micrographia Gait: not tested   Laboratory Studies:   Basic Metabolic Panel:  Recent Labs Lab 01/15/16 0707 01/16/16 0655 01/18/16 0618 01/19/16 0657 01/20/16 0627  NA 134*  --  135 132* 135  K 4.2  --  5.0 4.6 4.4  CL 101  --  99* 98* 100*  CO2 27  --  30 30 31   GLUCOSE 95  --  92 92 110*  BUN 48*  --  62* 57* 45*  CREATININE 1.41* 1.63* 2.37* 1.87* 1.45*  CALCIUM 9.6  --  9.6 9.0 9.3    Liver Function Tests:  Recent Labs Lab 01/18/16 0618  AST 11*  ALT 11*  ALKPHOS 77  BILITOT 0.5  PROT 6.6  ALBUMIN 3.7   No results for input(s): LIPASE, AMYLASE in the last 168 hours. No results for input(s): AMMONIA in the last 168 hours.  CBC:  Recent Labs Lab 01/16/16 0655 01/19/16 0657  WBC 4.5 4.5  HGB 13.8 12.4*   HCT 41.5 37.3*  MCV 89.1 87.1  PLT 152 139*    Cardiac Enzymes: No results for input(s): CKTOTAL, CKMB, CKMBINDEX, TROPONINI in the last 168 hours.  BNP: Invalid input(s): POCBNP  CBG:  Recent Labs Lab 01/19/16 0653 01/19/16 1141 01/19/16 1707 01/19/16 2059 01/20/16 0634  GLUCAP 84 121* 132* 189* 100*    Microbiology: Results for orders placed or performed during the hospital encounter of 07/16/15  Culture, blood (routine x 2)     Status: None   Collection Time: 07/25/15 11:44 PM  Result Value Ref Range Status   Specimen Description BLOOD LEFT ASSIST CONTROL  Final   Special Requests BOTTLES DRAWN AEROBIC AND ANAEROBIC 5CC  Final   Culture NO GROWTH 5 DAYS  Final   Report Status 07/31/2015 FINAL  Final    Coagulation Studies: No results for input(s): LABPROT, INR in the last 72 hours.  Urinalysis: No results for input(s): COLORURINE, LABSPEC, PHURINE, GLUCOSEU, HGBUR, BILIRUBINUR, KETONESUR, PROTEINUR, UROBILINOGEN, NITRITE, LEUKOCYTESUR in the last 168 hours.  Invalid input(s): APPERANCEUR  Lipid Panel:     Component Value Date/Time   CHOL 108 12/28/2015 0743   TRIG 62 12/28/2015 0743   HDL 33* 12/28/2015 0743   CHOLHDL 3.3 12/28/2015 0743   VLDL 12 12/28/2015 0743   LDLCALC 63 12/28/2015 0743    HgbA1C:  Lab Results  Component Value Date   HGBA1C 7.5* 09/01/2015    Urine Drug Screen:     Component Value Date/Time   LABOPIA NONE DETECTED 07/09/2015 2029   LABOPIA NONE DETECTED 06/28/2015 2000   COCAINSCRNUR NONE DETECTED 06/28/2015 2000   LABBENZ POSITIVE* 07/09/2015 2029   LABBENZ NONE DETECTED 06/28/2015 2000   AMPHETMU NONE DETECTED 07/09/2015 2029   AMPHETMU NONE DETECTED 06/28/2015 2000   THCU NONE DETECTED 07/09/2015 2029   THCU NONE DETECTED 06/28/2015 2000   LABBARB NONE DETECTED 07/09/2015 2029   LABBARB NONE DETECTED 06/28/2015 2000    Alcohol Level: No results for input(s): ETH in the last 168 hours.   Imaging: No results  found.   Assessment/Plan: 65 year old male with tremor.  Previously noted to interfere with ADL's.  Does not appear to be that severe at this time.  Patient has had a long history of antipsychotic use.  Does have some mild increase in tone that may be predisposing  to tremor.  If becomes more of a problem would consider starting Cogentin.  TSH normal.    No further neurologic intervention is recommended at this time.  If further questions arise, please call or page at that time.  Thank you for allowing neurology to participate in the care of this patient.  Thana Farr, MD Neurology [phone] 01/20/2016, 12:28 PM

## 2016-01-20 NOTE — Progress Notes (Signed)
Recreation Therapy Notes  Date: 02.21.17 Time: 3:00 pm Location: Community Room  Group Topic: Goal Setting  Goal Area(s) Addresses:  Patient will write down one goal. Patient will verbalize benefit of setting goals.  Behavioral Response: Did not attend  Intervention: Step By Step  Activity: Patients were given a worksheet with a foot on it. Patients were instructed to write a goal inside the foot and write positive or encouraging statements on the outside of the foot.  Education: LRT educated patients on ways they can achieve their goal.  Education Outcome: Patient did not attend group.   Clinical Observations/Feedback: Patient did not attend group.  Jacquelynn Cree, LRT/CTRS 01/20/2016 4:08 PM

## 2016-01-20 NOTE — Progress Notes (Signed)
D:  Denies SI/HI/AVH.   Affect flat.  Rates depression as a 8.  Continues to stay in his room in bed. Unable to get him to take a shower, body odor noted.   A:  Support and encouragement offered.  Safety maintained. R:  Medication compliant.

## 2016-01-20 NOTE — [facility] Group Notes (Signed)
[facility] Group Notes:  (Nursing/MHT/Case Management/Adjunct)  Date:  01/20/2016  Time:  2:05 PM  Type of Therapy:  Psychoeducational Skills  Participation Level:  Did Not Attend   Lynelle Smoke [facility] 01/20/2016, 2:05 PM

## 2016-01-20 NOTE — Progress Notes (Signed)
[facility] MD Progress Note  01/20/2016 6:58 PM Christian [name]  MRN:  [id number] Subjective: Follow-up as of Tuesday the 21st. Patient is unchanged as far as I can tell. He says his mood feels stable, but his affect is flat and his activity is virtually nonexistent. Very withdrawn. Denies suicidal ideation but not making much effort to take care of himself. He is eating and drinking a little more adequately. Principal Problem: Bipolar disorder, current episode depressed, severe, with psychotic features ([place]) Diagnosis:   Patient Active Problem List   Diagnosis Date Noted  . Bipolar I disorder, most recent episode depressed, severe w psychosis ([place]) [F31.5] 12/22/2015  . Bipolar affective disorder, depressed, severe, with psychotic behavior ([place]) [F31.5]   . Bipolar I disorder, current or most recent episode depressed, with psychotic features ([place]) [F31.5]   . Bipolar I disorder, most recent episode depressed ([place]) [F31.30]   . Severe bipolar I disorder with depression ([place]) [F31.4]   . Bipolar I disorder, most recent episode depressed, severe with psychotic features ([place]) [F31.5]   . Overdose [T50.901A] 09/11/2015  . Acute respiratory failure with hypoxia ([place]) [J96.01] 09/05/2015  . Elevated troponin [R79.89] 09/05/2015  . Somnolence [R40.0] 09/05/2015  . Bipolar depression ([place]) [F31.30] 09/05/2015  . Acute bilateral deep vein thrombosis (DVT) of femoral veins (HCC) [I82.413] 09/05/2015  . Bilateral pulmonary embolism ([place]) [I26.99] 09/02/2015  . Labile hypertension [I10]   . Pulmonary embolism with acute cor pulmonale ([place]) [I26.09]   . Dyspnea [R06.00]   . Orthostatic hypotension [I95.1]   . Bipolar disorder, in partial remission, most recent episode depressed ([place]) [F31.75]   . Syncope, near [R55] 08/31/2015  . Affective psychosis, bipolar ([place]) [F31.9]   . Bipolar disorder, now depressed ([place]) [F31.30] 08/28/2015  . Bipolar I disorder, severe, current or most recent episode depressed,  with psychotic features ([place]) [F31.5]   . Pressure ulcer [L89.90] 07/30/2015  . Protein-calorie malnutrition, severe ([place]) [E43] 07/30/2015  . Dehydration [E86.0] 07/29/2015  . Bipolar disorder, current episode depressed, severe, with psychotic features ([place]) [F31.5] 07/09/2015  . Diabetes ([place]) [E11.9] 07/09/2015  . UTI (urinary tract infection) [N39.0] 06/19/2014  . Positive blood culture [R78.81] 06/19/2014  . Bipolar affective disorder, current episode manic with psychotic symptoms ([place]) [F31.2] 06/17/2014  . Type II or unspecified type diabetes mellitus with unspecified complication, uncontrolled [E11.8, E11.65] 06/02/2014  . Other and unspecified hyperlipidemia [E78.5] 06/02/2014  . OSA on CPAP [G47.33] 06/02/2014  . 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 Gout [M10.9] 06/02/2014  . Hypertension [I10]    Total Time spent with patient: 30 minutes  Past Psychiatric History: Long history of bipolar disorder with history of suicide attempts and now a history of aggression  Past Medical History:  Past Medical History  Diagnosis Date  . Mood swings ([place])   . Type II or unspecified type diabetes mellitus with unspecified complication, uncontrolled 06/02/2014  . Other and unspecified hyperlipidemia 06/02/2014  . OSA on CPAP 06/02/2014  . 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 Bipolar disorder ([place])   . Hypertension   . Gout   . Bipolar 1 disorder ([place])   . Hx of blood clots   . Chronic kidney disease    History reviewed. No pertinent past surgical history. Family History:  Family History  Problem Relation Age of Onset  . Dementia Mother   . Arthritis Father   . Hypertension Father   . Heart failure Neg  Hx   . Kidney failure Neg Hx   . Cancer Neg Hx   . Mental illness Sister   . Diabetes Sister    Family Psychiatric  History: Positive for bipolar disorder and anxiety Social History:   History  Alcohol Use No     History  Drug Use No    Social History   Social History  . Marital Status: Married    Spouse Name: N/A  . Number of Children: N/A  . Years of Education: N/A   Social History Main Topics  . Smoking status: Never Smoker   . Smokeless tobacco: None  . Alcohol Use: No  . Drug Use: No  . Sexual Activity: Not Currently   Other Topics Concern  . None   Social History Narrative   *The patient has been married for over 30 years and is currently on disability. He lives with his wife in the [place] area and has one daughter.             Additional Social History:                         Sleep: Poor  Appetite:  Fair  Current Medications: Current Facility-Administered Medications  Medication Dose Route Frequency Provider Last Rate Last Dose  . acetaminophen (TYLENOL) tablet 650 mg  650 mg Oral Q6H PRN Gonzella Lex, MD   650 mg at 01/18/16 2110  . allopurinol (ZYLOPRIM) tablet 100 mg  100 mg Oral Daily Loletha Grayer, MD   100 mg at 01/20/16 1233  . alum & mag hydroxide-simeth (MAALOX/MYLANTA) 200-200-20 MG/5ML suspension 30 mL  30 mL Oral Q4H PRN Gonzella Lex, MD      . apixaban (ELIQUIS) tablet 5 mg  5 mg Oral BID Gonzella Lex, MD   5 mg at 01/20/16 1234  . aspirin EC tablet 81 mg  81 mg Oral Daily Gonzella Lex, MD   81 mg at 01/20/16 1234  . atorvastatin (LIPITOR) tablet 10 mg  10 mg Oral q1800 Gonzella Lex, MD   10 mg at 01/20/16 1655  . dextrose 5 % solution   Intravenous Continuous Gonzella Lex, MD 10 mL/hr at 01/12/16 1130    . docusate sodium (COLACE) capsule 100 mg  100 mg Oral BID Gonzella Lex, MD   100 mg at 01/20/16 1234  . feeding supplement (GLUCERNA SHAKE) (GLUCERNA SHAKE) liquid 237 mL  237 mL Oral TID BM Hildred Priest, MD   237 mL at 01/20/16 1400  . finasteride (PROSCAR) tablet 5 mg  5 mg Oral Daily Gonzella Lex, MD   5 mg at 01/20/16 1233  . insulin aspart (novoLOG) injection 0-9 Units  0-9  Units Subcutaneous TID WC Chauncey Mann, MD   1 Units at 01/20/16 1655  . magnesium hydroxide (MILK OF MAGNESIA) suspension 30 mL  30 mL Oral Daily PRN Gonzella Lex, MD   30 mL at 01/08/16 2315  . metFORMIN (GLUCOPHAGE-XR) 24 hr tablet 500 mg  500 mg Oral BID WC Loletha Grayer, MD   500 mg at 01/20/16 1733  . metoprolol tartrate (LOPRESSOR) tablet 12.5 mg  12.5 mg Oral BID Loletha Grayer, MD   12.5 mg at 01/20/16 1232  . mirtazapine (REMERON) tablet 45 mg  45 mg Oral QHS Gonzella Lex, MD   45 mg at 01/19/16 2137  . multivitamin with minerals tablet 1 tablet  1 tablet Oral Daily Hildred Priest,  MD   1 tablet at 01/20/16 1234  . ondansetron (ZOFRAN-ODT) disintegrating tablet 4 mg  4 mg Oral Q8H PRN Gonzella Lex, MD   4 mg at 01/09/16 1340  . ondansetron (ZOFRAN-ODT) disintegrating tablet 4 mg  4 mg Oral Q8H PRN Gonzella Lex, MD   4 mg at 01/16/16 1739  . pantoprazole (PROTONIX) EC tablet 40 mg  40 mg Oral Daily Gonzella Lex, MD   40 mg at 01/20/16 1233  . polyethylene glycol (MIRALAX / GLYCOLAX) packet 17 g  17 g Oral Daily Gonzella Lex, MD   17 g at 01/18/16 0849  . QUEtiapine (SEROQUEL) tablet 400 mg  400 mg Oral QHS Gonzella Lex, MD   400 mg at 01/19/16 2138  . tamsulosin (FLOMAX) capsule 0.4 mg  0.4 mg Oral QPC supper Gonzella Lex, MD   0.4 mg at 01/20/16 1655    Lab Results:  Results for orders placed or performed during the hospital encounter of 12/22/15 (from the past 48 hour(s))  Glucose, capillary     Status: Abnormal   Collection Time: 01/18/16  8:53 PM  Result Value Ref Range   Glucose-Capillary 190 (H) 65 - 99 mg/dL   Comment 1 Notify RN   Glucose, capillary     Status: None   Collection Time: 01/19/16  6:53 AM  Result Value Ref Range   Glucose-Capillary 84 65 - 99 mg/dL  CBC     Status: Abnormal   Collection Time: 01/19/16  6:57 AM  Result Value Ref Range   WBC 4.5 3.8 - 10.6 K/uL   RBC 4.28 (L) 4.40 - 5.90 MIL/uL   Hemoglobin 12.4 (L) 13.0 - 18.0  g/dL   HCT 37.3 (L) 40.0 - 52.0 %   MCV 87.1 80.0 - 100.0 fL   MCH 28.9 26.0 - 34.0 pg   MCHC 33.2 32.0 - 36.0 g/dL   RDW 13.4 11.5 - 14.5 %   Platelets 139 (L) 150 - 440 K/uL  Basic metabolic panel     Status: Abnormal   Collection Time: 01/19/16  6:57 AM  Result Value Ref Range   Sodium 132 (L) 135 - 145 mmol/L   Potassium 4.6 3.5 - 5.1 mmol/L   Chloride 98 (L) 101 - 111 mmol/L   CO2 30 22 - 32 mmol/L   Glucose, Bld 92 65 - 99 mg/dL   BUN 57 (H) 6 - 20 mg/dL   Creatinine, Ser 1.87 (H) 0.61 - 1.24 mg/dL   Calcium 9.0 8.9 - 10.3 mg/dL   GFR calc non Af Amer 37 (L) >60 mL/min   GFR calc Af Amer 43 (L) >60 mL/min    Comment: (NOTE) The eGFR has been calculated using the CKD EPI equation. This calculation has not been validated in all clinical situations. eGFR's persistently <60 mL/min signify possible Chronic Kidney Disease.    Anion gap 4 (L) 5 - 15  TSH     Status: None   Collection Time: 01/19/16  6:57 AM  Result Value Ref Range   TSH 2.464 0.350 - 4.500 uIU/mL  Glucose, capillary     Status: Abnormal   Collection Time: 01/19/16 11:41 AM  Result Value Ref Range   Glucose-Capillary 121 (H) 65 - 99 mg/dL  Glucose, capillary     Status: Abnormal   Collection Time: 01/19/16  5:07 PM  Result Value Ref Range   Glucose-Capillary 132 (H) 65 - 99 mg/dL  Glucose, capillary     Status:  Abnormal   Collection Time: 01/19/16  8:59 PM  Result Value Ref Range   Glucose-Capillary 189 (H) 65 - 99 mg/dL  Basic metabolic panel     Status: Abnormal   Collection Time: 01/20/16  6:27 AM  Result Value Ref Range   Sodium 135 135 - 145 mmol/L   Potassium 4.4 3.5 - 5.1 mmol/L   Chloride 100 (L) 101 - 111 mmol/L   CO2 31 22 - 32 mmol/L   Glucose, Bld 110 (H) 65 - 99 mg/dL   BUN 45 (H) 6 - 20 mg/dL   Creatinine, Ser 1.45 (H) 0.61 - 1.24 mg/dL   Calcium 9.3 8.9 - 10.3 mg/dL   GFR calc non Af Amer 50 (L) >60 mL/min   GFR calc Af Amer 58 (L) >60 mL/min    Comment: (NOTE) The eGFR has been  calculated using the CKD EPI equation. This calculation has not been validated in all clinical situations. eGFR's persistently <60 mL/min signify possible Chronic Kidney Disease.    Anion gap 4 (L) 5 - 15  Glucose, capillary     Status: Abnormal   Collection Time: 01/20/16  6:34 AM  Result Value Ref Range   Glucose-Capillary 100 (H) 65 - 99 mg/dL   Comment 1 Notify RN   Glucose, capillary     Status: Abnormal   Collection Time: 01/20/16 12:26 PM  Result Value Ref Range   Glucose-Capillary 212 (H) 65 - 99 mg/dL  Glucose, capillary     Status: Abnormal   Collection Time: 01/20/16  4:51 PM  Result Value Ref Range   Glucose-Capillary 131 (H) 65 - 99 mg/dL    Physical Findings: AIMS: Facial and Oral Movements Muscles of Facial Expression: None, normal Lips and Perioral Area: None, normal Jaw: None, normal Tongue: None, normal,Extremity Movements Upper (arms, wrists, hands, fingers): None, normal Lower (legs, knees, ankles, toes): None, normal, Trunk Movements Neck, shoulders, hips: None, normal, Overall Severity Severity of abnormal movements (highest score from questions above): None, normal Incapacitation due to abnormal movements: None, normal Patient's awareness of abnormal movements (rate only patient's report): No Awareness, Dental Status Current problems with teeth and/or dentures?: No Does patient usually wear dentures?: No  CIWA:    COWS:     Musculoskeletal: Strength & Muscle Tone: decreased Gait & Station: shuffle Patient leans: N/A  Psychiatric Specialty Exam: Review of Systems  HENT: Negative.   Eyes: Negative.   Respiratory: Negative.   Cardiovascular: Negative.   Gastrointestinal: Negative.   Musculoskeletal: Negative.   Skin: Negative.   Neurological: Positive for weakness.  Psychiatric/Behavioral: Positive for depression and memory loss. Negative for suicidal ideas, hallucinations and substance abuse. The patient is nervous/anxious and has insomnia.      Blood pressure 135/94, pulse 90, temperature 98.2 F (36.8 C), temperature source Oral, resp. rate 18, height 6' (1.829 m), weight 102.967 kg (227 lb), SpO2 98 %.Body mass index is 30.78 kg/(m^2).  General Appearance: Casual  Eye Contact::  Fair  Speech:  Slow  Volume:  Decreased  Mood:  Dysphoric  Affect:  Constricted  Thought Process:  Tangential  Orientation:  Full (Time, Place, and Person)  Thought Content:  Rumination  Suicidal Thoughts:  No  Homicidal Thoughts:  No  Memory:  Immediate;   Fair Recent;   Fair Remote;   Fair  Judgement:  Poor  Insight:  Fair  Psychomotor Activity:  Psychomotor Retardation  Concentration:  Fair  Recall:  [employer] of Knowledge:Fair  Language: Fair  Akathisia:  No  Handed:  Right  AIMS (if indicated):     Assets:  Desire for Improvement Resilience Social Support  ADL's:  Impaired  Cognition: Impaired,  Mild  Sleep:  Number of Hours: 6.75  Tremor looks a little bit better. Blood pressure is okay little bit high not too bad. At least it's not too low. Affect flat. Patient has failed ECT trial. We are working on possible transfer to longer term hospital. 01/20/2016, 6:58 PM

## 2016-01-21 LAB — GLUCOSE, CAPILLARY
Glucose-Capillary: 123 mg/dL — ABNORMAL HIGH (ref 65–99)
Glucose-Capillary: 168 mg/dL — ABNORMAL HIGH (ref 65–99)
Glucose-Capillary: 125 mg/dL — ABNORMAL HIGH (ref 65–99)
Glucose-Capillary: 154 mg/dL — ABNORMAL HIGH (ref 65–99)

## 2016-01-21 MED ORDER — CLONAZEPAM 0.5 MG PO TABS
0.5000 mg | ORAL_TABLET | Freq: Three times a day (TID) | ORAL | Status: DC
  Administered 2016-01-21 – 2016-02-03 (×39): 0.5 mg via ORAL
  Filled 2016-01-21 (×39): qty 1

## 2016-01-21 NOTE — [facility] Group Notes (Signed)
[facility] LCSW Group Therapy  01/21/2016 2:37 PM  Type of Therapy:  Group Therapy  Participation Level:  Did Not Attend  Summary of Progress/Problems: Patient was called to group but did not attend.   Lulu Riding, MSW, LCSWA 01/21/2016, 2:37 PM

## 2016-01-21 NOTE — [facility] Group Notes (Signed)
[facility] LCSW Aftercare Discharge Planning Group Note   01/21/2016 12:07 PM  Participation Quality:  Patient was called to group but did not attend.    Crystel Demarco T, MSW, LCSWA   

## 2016-01-21 NOTE — [facility] Group Notes (Signed)
[facility] Group Notes:  (Nursing/MHT/Case Management/Adjunct)  Date:  01/21/2016  Time:  2:11 PM  Type of Therapy:  Psychoeducational Skills  Participation Level:  Did Not Attend  Darrow Bussing 01/21/2016, 2:11 PM

## 2016-01-21 NOTE — Progress Notes (Signed)
D: Observed pt in room lying in bed awake. Patient alert and oriented x4. Patient denies SI/HI/AVH. Pt affect is depressed. Pt rated depression 5/10, but still indicated that his mood had not improved at all since he has been down here. Pt mentioned that he talked with Dr. Toni Amend today about being more active and going to groups. Pt refused colace as he stated he had a bowel movement two days ago. Pt still exhibits moderate tremors. A: Offered active listening and support. Provided therapeutic communication. Administered scheduled medications. Educated pt on the benefits of physical activity and encouraged pt to be more physically active and go to at least two groups tomorrow. R: In response to goal about going to two groups, pt stated "I'll try," but no real commitment was given. Pt pleasant and cooperative. Pt medication compliant. Will continue Q15 min. checks. Safety maintained.

## 2016-01-21 NOTE — Progress Notes (Signed)
Recreation Therapy Notes  Date: 02.22.17 Time: 3:00 pm Location: Community Room   Group Topic: Self-esteem, Coping skills  Goal Area(s) Addresses:  Patient will identify positive traits about self. Patient will identify at least one coping skill.  Behavioral Response: Did not attend  Intervention: All About Me  Activity: Patients were instructed to make an All About Me pamphlet including their life's motto, positive traits about themselves, healthy coping skills, and their support system.  Education: LRT educated patients on ways they can increase their self-esteem.  Education Outcome: Patient did not attend group.  Clinical Observations/Feedback: Patient did not attend group.  Jacquelynn Cree, LRT/CTRS 01/21/2016 4:13 PM

## 2016-01-21 NOTE — Progress Notes (Signed)
D:  Per pt self inventory pt reports sleeping poor, appetite poor, energy level low, ability to pay attention poor, rates depression at an 8 out of 10, hopelessness at an 8 out of 10, anxiety at an 8 out of 10, denies SI/HI/AVH, goal today: "staying on point, follow directions", flat/depressed during interaction.     A:  Emotional support provided, Encouraged pt to continue with treatment plan and attend all group activities, q15 min checks maintained for safety.  R:  Pt is not receptive, not going to groups, pleasant and cooperative with staff and other patients on the unit.

## 2016-01-21 NOTE — Progress Notes (Signed)
Pt began throwing up at dinner, assisted pt to bed, provided pt with basin and ginger ale, MD notified.

## 2016-01-21 NOTE — Progress Notes (Signed)
[facility] MD Progress Note  01/21/2016 5:49 PM Christian [name]  MRN:  [id number] Subjective: Follow-up as of Wednesday the 22nd. He was vomiting this afternoon. Feeling more sick to his stomach. Still depressed. Still not getting out of bed very much. Blood pressure is stable.Blood sugar stable. No other new labs. No acute suicidal ideation but also barely speaking or communicating Principal Problem: Bipolar disorder, current episode depressed, severe, with psychotic features ([facility]) Diagnosis:   Patient Active Problem List   Diagnosis Date Noted  . Bipolar I disorder, most recent episode depressed, severe w psychosis ([place]) [F31.5] 12/22/2015  . Bipolar affective disorder, depressed, severe, with psychotic behavior ([place]) [F31.5]   . Bipolar I disorder, current or most recent episode depressed, with psychotic features ([place]) [F31.5]   . Bipolar I disorder, most recent episode depressed ([place]) [F31.30]   . Severe bipolar I disorder with depression ([place]) [F31.4]   . Bipolar I disorder, most recent episode depressed, severe with psychotic features ([place]) [F31.5]   . Overdose [T50.901A] 09/11/2015  . Acute respiratory failure with hypoxia ([place]) [J96.01] 09/05/2015  . Elevated troponin [R79.89] 09/05/2015  . Somnolence [R40.0] 09/05/2015  . Bipolar depression ([place]) [F31.30] 09/05/2015  . Acute bilateral deep vein thrombosis (DVT) of femoral veins (HCC) [I82.413] 09/05/2015  . Bilateral pulmonary embolism ([place]) [I26.99] 09/02/2015  . Labile hypertension [I10]   . Pulmonary embolism with acute cor pulmonale ([place]) [I26.09]   . Dyspnea [R06.00]   . Orthostatic hypotension [I95.1]   . Bipolar disorder, in partial remission, most recent episode depressed ([place]) [F31.75]   . Syncope, near [R55] 08/31/2015  . Affective psychosis, bipolar ([place]) [F31.9]   . Bipolar disorder, now depressed ([place]) [F31.30] 08/28/2015  . Bipolar I disorder, severe, current or most recent episode depressed, with psychotic features  ([place]) [F31.5]   . Pressure ulcer [L89.90] 07/30/2015  . Protein-calorie malnutrition, severe ([place]) [E43] 07/30/2015  . Dehydration [E86.0] 07/29/2015  . Bipolar disorder, current episode depressed, severe, with psychotic features ([place]) [F31.5] 07/09/2015  . Diabetes ([place]) [E11.9] 07/09/2015  . UTI (urinary tract infection) [N39.0] 06/19/2014  . Positive blood culture [R78.81] 06/19/2014  . Bipolar affective disorder, current episode manic with psychotic symptoms ([place]) [F31.2] 06/17/2014  . Type II or unspecified type diabetes mellitus with unspecified complication, uncontrolled [E11.8, E11.65] 06/02/2014  . Other and unspecified hyperlipidemia [E78.5] 06/02/2014  . OSA on CPAP [G47.33] 06/02/2014  . 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 Gout [M10.9] 06/02/2014  . Hypertension [I10]    Total Time spent with patient: 30 minutes  Past Psychiatric History: Long history of bipolar disorder with history of suicide attempts and now a history of aggression  Past Medical History:  Past Medical History  Diagnosis Date  . Mood swings ([place])   . Type II or unspecified type diabetes mellitus with unspecified complication, uncontrolled 06/02/2014  . Other and unspecified hyperlipidemia 06/02/2014  . OSA on CPAP 06/02/2014  . 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 Bipolar disorder ([place])   . Hypertension   . Gout   . Bipolar 1 disorder ([place])   . Hx of blood clots   . Chronic kidney disease    History reviewed. No pertinent past surgical history. Family History:  Family History  Problem Relation Age of Onset  . Dementia Mother   . Arthritis Father   . Hypertension Father   . Heart failure Neg Hx   . Kidney failure Neg Hx   .  Cancer Neg Hx   . Mental illness Sister   . Diabetes Sister    Family Psychiatric  History: Positive for bipolar disorder and anxiety Social History:  History  Alcohol Use No      History  Drug Use No    Social History   Social History  . Marital Status: Married    Spouse Name: N/A  . Number of Children: N/A  . Years of Education: N/A   Social History Main Topics  . Smoking status: Never Smoker   . Smokeless tobacco: None  . Alcohol Use: No  . Drug Use: No  . Sexual Activity: Not Currently   Other Topics Concern  . None   Social History Narrative   *The patient has been married for over 30 years and is currently on disability. He lives with his wife in the [place] area and has one daughter.             Additional Social History:                         Sleep: Poor  Appetite:  Fair  Current Medications: Current Facility-Administered Medications  Medication Dose Route Frequency Provider Last Rate Last Dose  . acetaminophen (TYLENOL) tablet 650 mg  650 mg Oral Q6H PRN Gonzella Lex, MD   650 mg at 01/18/16 2110  . allopurinol (ZYLOPRIM) tablet 100 mg  100 mg Oral Daily Loletha Grayer, MD   100 mg at 01/21/16 8850  . alum & mag hydroxide-simeth (MAALOX/MYLANTA) 200-200-20 MG/5ML suspension 30 mL  30 mL Oral Q4H PRN Gonzella Lex, MD      . apixaban (ELIQUIS) tablet 5 mg  5 mg Oral BID Gonzella Lex, MD   5 mg at 01/21/16 2774  . aspirin EC tablet 81 mg  81 mg Oral Daily Gonzella Lex, MD   81 mg at 01/21/16 1287  . atorvastatin (LIPITOR) tablet 10 mg  10 mg Oral q1800 Gonzella Lex, MD   10 mg at 01/21/16 1709  . dextrose 5 % solution   Intravenous Continuous Gonzella Lex, MD 10 mL/hr at 01/12/16 1130    . docusate sodium (COLACE) capsule 100 mg  100 mg Oral BID Gonzella Lex, MD   100 mg at 01/20/16 1234  . feeding supplement (GLUCERNA SHAKE) (GLUCERNA SHAKE) liquid 237 mL  237 mL Oral TID BM Hildred Priest, MD   237 mL at 01/21/16 1014  . finasteride (PROSCAR) tablet 5 mg  5 mg Oral Daily Gonzella Lex, MD   5 mg at 01/21/16 8676  . insulin aspart (novoLOG) injection 0-9 Units  0-9 Units Subcutaneous TID WC Chauncey Mann, MD   1 Units at 01/21/16 1709  . magnesium hydroxide (MILK OF MAGNESIA) suspension 30 mL  30 mL Oral Daily PRN Gonzella Lex, MD   30 mL at 01/08/16 2315  . metFORMIN (GLUCOPHAGE-XR) 24 hr tablet 500 mg  500 mg Oral BID WC Loletha Grayer, MD   500 mg at 01/21/16 1711  . metoprolol tartrate (LOPRESSOR) tablet 12.5 mg  12.5 mg Oral BID Loletha Grayer, MD   12.5 mg at 01/21/16 7209  . mirtazapine (REMERON) tablet 45 mg  45 mg Oral QHS Gonzella Lex, MD   45 mg at 01/20/16 2208  . multivitamin with minerals tablet 1 tablet  1 tablet Oral Daily Hildred Priest, MD   1 tablet at 01/21/16 [id number]  . ondansetron (  ZOFRAN-ODT) disintegrating tablet 4 mg  4 mg Oral Q8H PRN Gonzella Lex, MD   4 mg at 01/09/16 1340  . ondansetron (ZOFRAN-ODT) disintegrating tablet 4 mg  4 mg Oral Q8H PRN Gonzella Lex, MD   4 mg at 01/16/16 1739  . pantoprazole (PROTONIX) EC tablet 40 mg  40 mg Oral Daily Gonzella Lex, MD   40 mg at 01/21/16 0623  . polyethylene glycol (MIRALAX / GLYCOLAX) packet 17 g  17 g Oral Daily Gonzella Lex, MD   17 g at 01/18/16 0849  . QUEtiapine (SEROQUEL) tablet 400 mg  400 mg Oral QHS Gonzella Lex, MD   400 mg at 01/20/16 2208  . tamsulosin (FLOMAX) capsule 0.4 mg  0.4 mg Oral QPC supper Gonzella Lex, MD   0.4 mg at 01/21/16 1711    Lab Results:  Results for orders placed or performed during the hospital encounter of 12/22/15 (from the past 48 hour(s))  Glucose, capillary     Status: Abnormal   Collection Time: 01/19/16  8:59 PM  Result Value Ref Range   Glucose-Capillary 189 (H) 65 - 99 mg/dL  Basic metabolic panel     Status: Abnormal   Collection Time: 01/20/16  6:27 AM  Result Value Ref Range   Sodium 135 135 - 145 mmol/L   Potassium 4.4 3.5 - 5.1 mmol/L   Chloride 100 (L) 101 - 111 mmol/L   CO2 31 22 - 32 mmol/L   Glucose, Bld 110 (H) 65 - 99 mg/dL   BUN 45 (H) 6 - 20 mg/dL   Creatinine, Ser 1.45 (H) 0.61 - 1.24 mg/dL   Calcium 9.3 8.9 - 10.3 mg/dL    GFR calc non Af Amer 50 (L) >60 mL/min   GFR calc Af Amer 58 (L) >60 mL/min    Comment: (NOTE) The eGFR has been calculated using the CKD EPI equation. This calculation has not been validated in all clinical situations. eGFR's persistently <60 mL/min signify possible Chronic Kidney Disease.    Anion gap 4 (L) 5 - 15  Glucose, capillary     Status: Abnormal   Collection Time: 01/20/16  6:34 AM  Result Value Ref Range   Glucose-Capillary 100 (H) 65 - 99 mg/dL   Comment 1 Notify RN   Glucose, capillary     Status: Abnormal   Collection Time: 01/20/16 12:26 PM  Result Value Ref Range   Glucose-Capillary 212 (H) 65 - 99 mg/dL  Glucose, capillary     Status: Abnormal   Collection Time: 01/20/16  4:51 PM  Result Value Ref Range   Glucose-Capillary 131 (H) 65 - 99 mg/dL  Glucose, capillary     Status: Abnormal   Collection Time: 01/20/16 10:04 PM  Result Value Ref Range   Glucose-Capillary 165 (H) 65 - 99 mg/dL  Glucose, capillary     Status: Abnormal   Collection Time: 01/21/16  6:54 AM  Result Value Ref Range   Glucose-Capillary 123 (H) 65 - 99 mg/dL  Glucose, capillary     Status: Abnormal   Collection Time: 01/21/16 12:05 PM  Result Value Ref Range   Glucose-Capillary 168 (H) 65 - 99 mg/dL   Comment 1 Notify RN   Glucose, capillary     Status: Abnormal   Collection Time: 01/21/16  5:11 PM  Result Value Ref Range   Glucose-Capillary 125 (H) 65 - 99 mg/dL   Comment 1 Notify RN     Physical Findings: AIMS: Facial and  Oral Movements Muscles of Facial Expression: None, normal Lips and Perioral Area: None, normal Jaw: None, normal Tongue: None, normal,Extremity Movements Upper (arms, wrists, hands, fingers): None, normal Lower (legs, knees, ankles, toes): None, normal, Trunk Movements Neck, shoulders, hips: None, normal, Overall Severity Severity of abnormal movements (highest score from questions above): None, normal Incapacitation due to abnormal movements: None,  normal Patient's awareness of abnormal movements (rate only patient's report): No Awareness, Dental Status Current problems with teeth and/or dentures?: No Does patient usually wear dentures?: No  CIWA:    COWS:     Musculoskeletal: Strength & Muscle Tone: decreased Gait & Station: shuffle Patient leans: N/A  Psychiatric Specialty Exam: Review of Systems  HENT: Negative.   Eyes: Negative.   Respiratory: Negative.   Cardiovascular: Negative.   Gastrointestinal: Negative.   Musculoskeletal: Negative.   Skin: Negative.   Neurological: Positive for weakness.  Psychiatric/Behavioral: Positive for depression and memory loss. Negative for suicidal ideas, hallucinations and substance abuse. The patient is nervous/anxious and has insomnia.     Blood pressure 134/82, pulse 95, temperature 98.2 F (36.8 C), temperature source Oral, resp. rate 18, height 6' (1.829 m), weight 102.967 kg (227 lb), SpO2 98 %.Body mass index is 30.78 kg/(m^2).  General Appearance: Casual  Eye Contact::  Fair  Speech:  Slow  Volume:  Decreased  Mood:  Dysphoric  Affect:  Constricted  Thought Process:  Tangential  Orientation:  Full (Time, Place, and Person)  Thought Content:  Rumination  Suicidal Thoughts:  No  Homicidal Thoughts:  No  Memory:  Immediate;   Fair Recent;   Fair Remote;   Fair  Judgement:  Poor  Insight:  Fair  Psychomotor Activity:  Psychomotor Retardation  Concentration:  Fair  Recall:  [employer] of Knowledge:Fair  Language: Fair  Akathisia:  No  Handed:  Right  AIMS (if indicated):     Assets:  Desire for Improvement Resilience Social Support  ADL's:  Impaired  Cognition: Impaired,  Mild  Sleep:  Number of Hours: 6.75  Tremor looks a little bit better. Blood pressure is okay little bit high not too bad. At least it's not too low. Affect flat. Patient has failed ECT trial. We are working on possible transfer to longer term hospital.After failure of ECT trial in going to start  him back on some benzodiazepines because the tremor and the nausea may be coming just from anxiety. I spoke with social work today and we are going to talk with his wife and see if we can perhaps have a meeting to plan more details about discharge. 01/21/2016, 5:49 PM
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
## 2016-01-21 NOTE — [facility] Group Notes (Signed)
[facility] Group Notes:  (Nursing/MHT/Case Management/Adjunct)  Date:  01/21/2016  Time:  9:21 PM  Type of Therapy:  Wrap-up Group  Participation Level:  Did Not Attend  Participation Quality:  N/A  Affect:  N/A  Cognitive:  N/A  Insight:  None  Engagement in Group:  N/A  Modes of Intervention:  N/A  Summary of Progress/Problems:  Tomasita Morrow 01/21/2016, 9:21 PM

## 2016-01-21 NOTE — Plan of Care (Signed)
Problem: Alteration in mood Goal: LTG-Pt's behavior demonstrates decreased signs of depression (Patient's behavior demonstrates decreased signs of depression to the point the patient is safe to return home and continue treatment in an outpatient setting)  Outcome: Not Progressing Pt affect is flat. Pt isolates to room, forwards little to nurse. Indicates his mood has not improved.

## 2016-01-21 NOTE — Progress Notes (Signed)
Attempted EEG. Pt was actively throwing up. Will attempt tomorrow

## 2016-01-22 LAB — CREATININE, SERUM
Creatinine, Ser: 1.28 mg/dL — ABNORMAL HIGH (ref 0.61–1.24)
GFR calc Af Amer: >60 mL/min (ref >60)
GFR calc non Af Amer: 58 mL/min — ABNORMAL LOW (ref >60)

## 2016-01-22 LAB — CBC
HCT: 40.3 % (ref 40.0–52.0)
Hemoglobin: 13.6 g/dL (ref 13.0–18.0)
MCH: 29.3 pg (ref 26.0–34.0)
MCHC: 33.7 g/dL (ref 32.0–36.0)
MCV: 86.9 fL (ref 80.0–100.0)
Platelets: 184 10*3/uL (ref 150–440)
RBC: 4.64 MIL/uL (ref 4.40–5.90)
RDW: 13.6 % (ref 11.5–14.5)
WBC: 4.5 10*3/uL (ref 3.8–10.6)

## 2016-01-22 LAB — GLUCOSE, CAPILLARY
Glucose-Capillary: 126 mg/dL — ABNORMAL HIGH (ref 65–99)
Glucose-Capillary: 191 mg/dL — ABNORMAL HIGH (ref 65–99)
Glucose-Capillary: 112 mg/dL — ABNORMAL HIGH (ref 65–99)
Glucose-Capillary: 171 mg/dL — ABNORMAL HIGH (ref 65–99)

## 2016-01-22 NOTE — Progress Notes (Signed)
Patient ID: Christian [name], male   DOB: [birth year], 65 y.o.   MRN: [id number] [facility] PROGRESS NOTE  Christian [name] WUJ:[id number] DOB: 1951/11/10 DOA: 12/22/2015 PCP: Leanor Rubenstein, MD  HPI/Subjective: Marland Kitchen  Objective: Filed Vitals:   01/21/16 2100 01/22/16 0500  BP: 131/91 139/85  Pulse: 108 99  Temp: 98.6 F (37 C) 98 F (36.7 C)  Resp: 18 18    Filed Weights   01/09/16 0827 01/12/16 1103 01/16/16 0836  Weight: 102.967 kg (227 lb) 104.327 kg (230 lb) 102.967 kg (227 lb)    ROS: Review of Systems  Constitutional: Negative for fever and chills.  Eyes: Negative for blurred vision.  Respiratory: Negative for cough and shortness of breath.   Cardiovascular: Negative for chest pain.  Gastrointestinal: Negative for nausea, vomiting, abdominal pain, diarrhea and constipation.  Genitourinary: Negative for dysuria.  Musculoskeletal: Negative for joint pain.  Neurological: Positive for tremors. Negative for dizziness and headaches.   Exam: Physical Exam  HENT:  Nose: No mucosal edema.  Mouth/Throat: No oropharyngeal exudate or posterior oropharyngeal edema.  Eyes: Conjunctivae, EOM and lids are normal. Pupils are equal, round, and reactive to light.  Neck: No JVD present. Carotid bruit is not present. No edema present. No thyroid mass and no thyromegaly present.  Cardiovascular: S1 normal and S2 normal.  Exam reveals no gallop.   No murmur heard. Pulses:      Dorsalis pedis pulses are 2+ on the right side, and 2+ on the left side.  Respiratory: No respiratory distress. He has no wheezes. He has no rhonchi. He has no rales.  GI: Soft. Bowel sounds are normal. There is no tenderness.  Musculoskeletal:       Right ankle: He exhibits swelling.       Left ankle: He exhibits swelling.  Lymphadenopathy:    He has no cervical adenopathy.  Neurological: He is alert. No cranial nerve deficit.  Positive tremor bilateral upper extremities  Skin: Skin is warm. No rash  noted. Nails show no clubbing.  Psychiatric: He has a normal mood and affect.      Data Reviewed: Basic Metabolic Panel:  Recent Labs Lab 01/16/16 0655 01/18/16 0618 01/19/16 0657 01/20/16 0627 01/22/16 0655  NA  --  135 132* 135  --   K  --  5.0 4.6 4.4  --   CL  --  99* 98* 100*  --   CO2  --  --   GLUCOSE  --  92 92 110*  --   BUN  --  62* 57* 45*  --   CREATININE 1.63* 2.37* 1.87* 1.45* 1.28*  CALCIUM  --  9.6 9.0 9.3  --    Liver Function Tests:  Recent Labs Lab 01/18/16 0618  AST 11*  ALT 11*  ALKPHOS 77  BILITOT 0.5  PROT 6.6  ALBUMIN 3.7   CBC:  Recent Labs Lab 01/16/16 0655 01/19/16 0657 01/22/16 0655  WBC 4.5 4.5 4.5  HGB 13.8 12.4* 13.6  HCT 41.5 37.3* 40.3  MCV 89.1 87.1 86.9  PLT 152 139* 184    CBG:  Recent Labs Lab 01/21/16 1205 01/21/16 1711 01/21/16 2043 01/22/16 0648 01/22/16 1135  GLUCAP 168* 125* 154* 126* 191*    Scheduled Meds: . allopurinol  100 mg Oral Daily  . apixaban  5 mg Oral BID  . aspirin EC  81 mg Oral Daily  . atorvastatin  10 mg Oral q1800  . clonazePAM  0.5  mg Oral TID  . docusate sodium  100 mg Oral BID  . feeding supplement (GLUCERNA SHAKE)  237 mL Oral TID BM  . finasteride  5 mg Oral Daily  . insulin aspart  0-9 Units Subcutaneous TID WC  . metFORMIN  500 mg Oral BID WC  . metoprolol tartrate  12.5 mg Oral BID  . mirtazapine  45 mg Oral QHS  . multivitamin with minerals  1 tablet Oral Daily  . pantoprazole  40 mg Oral Daily  . polyethylene glycol  17 g Oral Daily  . QUEtiapine  400 mg Oral QHS  . tamsulosin  0.4 mg Oral QPC supper    Assessment/Plan:  1. 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 Patient's creatinine is improved today. I still encouraged and nursing staff to continue pushing oral fluids. I held the patient's Lasix and lisinopril. I renally dosed allopurinol. Restarted glucophage yesterday. 2. Tremor upper extremities. Seen by neuro, can consider cogentin  if bothersome to patient 3. Essential hypertension- continue metoprolol. 4. Type 2 diabetes mellitus. Restarted Glucophage. 5. History of DVT on eliquis 6. BPH continue finasteride and Flomax. 7. Weakness- physical therapy evaluation appreciated  Code Status:     Code Status Orders        Start     Ordered   12/22/15 1338  Full code   Continuous     12/22/15 1337    Code Status History    Date Active Date Inactive Code Status Order ID Comments User Context   12/22/2015  8:30 AM 12/22/2015  1:37 PM Full Code [id number]  Audery Amel, MD ED     Disposition: As per psychiatry  Consultants:  Neurology  Time spent:20 minutes   Alford Highland  [facility] Hospitalists

## 2016-01-22 NOTE — Progress Notes (Signed)
[facility] MD Progress Note  01/22/2016 7:53 PM Christian [name]  MRN:  [id number] Subjective: Follow-up Thursday the 23rd. Patient seen chart reviewed. If anything he looks even more withdrawn. He was awake and communicative but spoke only a couple words. Not getting out of bed at all. Suicidal ideation denies psychotic symptoms. Principal Problem: Bipolar disorder, current episode depressed, severe, with psychotic features ([place]) Diagnosis:   Patient Active Problem List   Diagnosis Date Noted  . Bipolar I disorder, most recent episode depressed, severe w psychosis ([place]) [F31.5] 12/22/2015  . Bipolar affective disorder, depressed, severe, with psychotic behavior ([place]) [F31.5]   . Bipolar I disorder, current or most recent episode depressed, with psychotic features ([place]) [F31.5]   . Bipolar I disorder, most recent episode depressed ([place]) [F31.30]   . Severe bipolar I disorder with depression ([place]) [F31.4]   . Bipolar I disorder, most recent episode depressed, severe with psychotic features ([place]) [F31.5]   . Overdose [T50.901A] 09/11/2015  . Acute respiratory failure with hypoxia ([place]) [J96.01] 09/05/2015  . Elevated troponin [R79.89] 09/05/2015  . Somnolence [R40.0] 09/05/2015  . Bipolar depression ([place]) [F31.30] 09/05/2015  . Acute bilateral deep vein thrombosis (DVT) of femoral veins (HCC) [I82.413] 09/05/2015  . Bilateral pulmonary embolism ([place]) [I26.99] 09/02/2015  . Labile hypertension [I10]   . Pulmonary embolism with acute cor pulmonale ([place]) [I26.09]   . Dyspnea [R06.00]   . Orthostatic hypotension [I95.1]   . Bipolar disorder, in partial remission, most recent episode depressed ([place]) [F31.75]   . Syncope, near [R55] 08/31/2015  . Affective psychosis, bipolar ([place]) [F31.9]   . Bipolar disorder, now depressed ([place]) [F31.30] 08/28/2015  . Bipolar I disorder, severe, current or most recent episode depressed, with psychotic features ([place]) [F31.5]   . Pressure ulcer [L89.90] 07/30/2015  .  Protein-calorie malnutrition, severe ([place]) [E43] 07/30/2015  . Dehydration [E86.0] 07/29/2015  . Bipolar disorder, current episode depressed, severe, with psychotic features ([place]) [F31.5] 07/09/2015  . Diabetes ([place]) [E11.9] 07/09/2015  . UTI (urinary tract infection) [N39.0] 06/19/2014  . Positive blood culture [R78.81] 06/19/2014  . Bipolar affective disorder, current episode manic with psychotic symptoms ([place]) [F31.2] 06/17/2014  . Type II or unspecified type diabetes mellitus with unspecified complication, uncontrolled [E11.8, E11.65] 06/02/2014  . Other and unspecified hyperlipidemia [E78.5] 06/02/2014  . OSA on CPAP [G47.33] 06/02/2014  . 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 Gout [M10.9] 06/02/2014  . Hypertension [I10]    Total Time spent with patient: 30 minutes  Past Psychiatric History: Long history of bipolar disorder with history of suicide attempts and now a history of aggression  Past Medical History:  Past Medical History  Diagnosis Date  . Mood swings ([place])   . Type II or unspecified type diabetes mellitus with unspecified complication, uncontrolled 06/02/2014  . Other and unspecified hyperlipidemia 06/02/2014  . OSA on CPAP 06/02/2014  . 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 Bipolar disorder ([place])   . Hypertension   . Gout   . Bipolar 1 disorder ([place])   . Hx of blood clots   . Chronic kidney disease    History reviewed. No pertinent past surgical history. Family History:  Family History  Problem Relation Age of Onset  . Dementia Mother   . Arthritis Father   . Hypertension Father   . Heart failure Neg Hx   . Kidney failure Neg Hx   . Cancer Neg Hx   . Mental  illness Sister   . Diabetes Sister    Family Psychiatric  History: Positive for bipolar disorder and anxiety Social History:  History  Alcohol Use No     History  Drug Use No    Social History   Social  History  . Marital Status: Married    Spouse Name: N/A  . Number of Children: N/A  . Years of Education: N/A   Social History Main Topics  . Smoking status: Never Smoker   . Smokeless tobacco: None  . Alcohol Use: No  . Drug Use: No  . Sexual Activity: Not Currently   Other Topics Concern  . None   Social History Narrative   *The patient has been married for over 30 years and is currently on disability. He lives with his wife in the [place] area and has one daughter.             Additional Social History:                         Sleep: Poor  Appetite:  Fair  Current Medications: Current Facility-Administered Medications  Medication Dose Route Frequency Provider Last Rate Last Dose  . acetaminophen (TYLENOL) tablet 650 mg  650 mg Oral Q6H PRN Gonzella Lex, MD   650 mg at 01/18/16 2110  . allopurinol (ZYLOPRIM) tablet 100 mg  100 mg Oral Daily Loletha Grayer, MD   100 mg at 01/22/16 0911  . alum & mag hydroxide-simeth (MAALOX/MYLANTA) 200-200-20 MG/5ML suspension 30 mL  30 mL Oral Q4H PRN Gonzella Lex, MD      . apixaban (ELIQUIS) tablet 5 mg  5 mg Oral BID Gonzella Lex, MD   5 mg at 01/22/16 0911  . aspirin EC tablet 81 mg  81 mg Oral Daily Gonzella Lex, MD   81 mg at 01/22/16 0912  . atorvastatin (LIPITOR) tablet 10 mg  10 mg Oral q1800 Gonzella Lex, MD   10 mg at 01/22/16 1657  . clonazePAM (KLONOPIN) tablet 0.5 mg  0.5 mg Oral TID Gonzella Lex, MD   0.5 mg at 01/22/16 1658  . dextrose 5 % solution   Intravenous Continuous Gonzella Lex, MD 10 mL/hr at 01/12/16 1130    . docusate sodium (COLACE) capsule 100 mg  100 mg Oral BID Gonzella Lex, MD   100 mg at 01/22/16 0912  . feeding supplement (GLUCERNA SHAKE) (GLUCERNA SHAKE) liquid 237 mL  237 mL Oral TID BM Hildred Priest, MD   237 mL at 01/22/16 1426  . finasteride (PROSCAR) tablet 5 mg  5 mg Oral Daily Gonzella Lex, MD   5 mg at 01/22/16 0911  . insulin aspart (novoLOG) injection  0-9 Units  0-9 Units Subcutaneous TID WC Chauncey Mann, MD   2 Units at 01/22/16 1206  . magnesium hydroxide (MILK OF MAGNESIA) suspension 30 mL  30 mL Oral Daily PRN Gonzella Lex, MD   30 mL at 01/08/16 2315  . metFORMIN (GLUCOPHAGE-XR) 24 hr tablet 500 mg  500 mg Oral BID WC Loletha Grayer, MD   500 mg at 01/22/16 1659  . metoprolol tartrate (LOPRESSOR) tablet 12.5 mg  12.5 mg Oral BID Loletha Grayer, MD   12.5 mg at 01/22/16 0912  . mirtazapine (REMERON) tablet 45 mg  45 mg Oral QHS Gonzella Lex, MD   45 mg at 01/21/16 2313  . multivitamin with minerals tablet 1 tablet  1  tablet Oral Daily Hildred Priest, MD   1 tablet at 01/22/16 0912  . ondansetron (ZOFRAN-ODT) disintegrating tablet 4 mg  4 mg Oral Q8H PRN Gonzella Lex, MD   4 mg at 01/09/16 1340  . ondansetron (ZOFRAN-ODT) disintegrating tablet 4 mg  4 mg Oral Q8H PRN Gonzella Lex, MD   4 mg at 01/16/16 1739  . pantoprazole (PROTONIX) EC tablet 40 mg  40 mg Oral Daily Gonzella Lex, MD   40 mg at 01/22/16 0911  . polyethylene glycol (MIRALAX / GLYCOLAX) packet 17 g  17 g Oral Daily Gonzella Lex, MD   17 g at 01/22/16 0913  . QUEtiapine (SEROQUEL) tablet 400 mg  400 mg Oral QHS Gonzella Lex, MD   400 mg at 01/21/16 2312  . tamsulosin (FLOMAX) capsule 0.4 mg  0.4 mg Oral QPC supper Gonzella Lex, MD   0.4 mg at 01/22/16 1658    Lab Results:  Results for orders placed or performed during the hospital encounter of 12/22/15 (from the past 48 hour(s))  Glucose, capillary     Status: Abnormal   Collection Time: 01/20/16 10:04 PM  Result Value Ref Range   Glucose-Capillary 165 (H) 65 - 99 mg/dL  Glucose, capillary     Status: Abnormal   Collection Time: 01/21/16  6:54 AM  Result Value Ref Range   Glucose-Capillary 123 (H) 65 - 99 mg/dL  Glucose, capillary     Status: Abnormal   Collection Time: 01/21/16 12:05 PM  Result Value Ref Range   Glucose-Capillary 168 (H) 65 - 99 mg/dL   Comment 1 Notify RN   Glucose,  capillary     Status: Abnormal   Collection Time: 01/21/16  5:11 PM  Result Value Ref Range   Glucose-Capillary 125 (H) 65 - 99 mg/dL   Comment 1 Notify RN   Glucose, capillary     Status: Abnormal   Collection Time: 01/21/16  8:43 PM  Result Value Ref Range   Glucose-Capillary 154 (H) 65 - 99 mg/dL  Glucose, capillary     Status: Abnormal   Collection Time: 01/22/16  6:48 AM  Result Value Ref Range   Glucose-Capillary 126 (H) 65 - 99 mg/dL  Creatinine, serum     Status: Abnormal   Collection Time: 01/22/16  6:55 AM  Result Value Ref Range   Creatinine, Ser 1.28 (H) 0.61 - 1.24 mg/dL   GFR calc non Af Amer 58 (L) >60 mL/min   GFR calc Af Amer >60 >60 mL/min    Comment: (NOTE) The eGFR has been calculated using the CKD EPI equation. This calculation has not been validated in all clinical situations. eGFR's persistently <60 mL/min signify possible Chronic Kidney Disease.   CBC     Status: None   Collection Time: 01/22/16  6:55 AM  Result Value Ref Range   WBC 4.5 3.8 - 10.6 K/uL   RBC 4.64 4.40 - 5.90 MIL/uL   Hemoglobin 13.6 13.0 - 18.0 g/dL   HCT 40.3 40.0 - 52.0 %   MCV 86.9 80.0 - 100.0 fL   MCH 29.3 26.0 - 34.0 pg   MCHC 33.7 32.0 - 36.0 g/dL   RDW 13.6 11.5 - 14.5 %   Platelets 184 150 - 440 K/uL  Glucose, capillary     Status: Abnormal   Collection Time: 01/22/16 11:35 AM  Result Value Ref Range   Glucose-Capillary 191 (H) 65 - 99 mg/dL  Glucose, capillary     Status:  Abnormal   Collection Time: 01/22/16  4:55 PM  Result Value Ref Range   Glucose-Capillary 112 (H) 65 - 99 mg/dL    Physical Findings: AIMS: Facial and Oral Movements Muscles of Facial Expression: None, normal Lips and Perioral Area: None, normal Jaw: None, normal Tongue: None, normal,Extremity Movements Upper (arms, wrists, hands, fingers): None, normal Lower (legs, knees, ankles, toes): None, normal, Trunk Movements Neck, shoulders, hips: None, normal, Overall Severity Severity of abnormal  movements (highest score from questions above): None, normal Incapacitation due to abnormal movements: None, normal Patient's awareness of abnormal movements (rate only patient's report): No Awareness, Dental Status Current problems with teeth and/or dentures?: No Does patient usually wear dentures?: No  CIWA:    COWS:     Musculoskeletal: Strength & Muscle Tone: decreased Gait & Station: shuffle Patient leans: N/A  Psychiatric Specialty Exam: Review of Systems  HENT: Negative.   Eyes: Negative.   Respiratory: Negative.   Cardiovascular: Negative.   Gastrointestinal: Negative.   Musculoskeletal: Negative.   Skin: Negative.   Neurological: Positive for weakness.  Psychiatric/Behavioral: Positive for depression and memory loss. Negative for suicidal ideas, hallucinations and substance abuse. The patient is nervous/anxious and has insomnia.     Blood pressure 139/85, pulse 99, temperature 98 F (36.7 C), temperature source Oral, resp. rate 18, height 6' (1.829 m), weight 102.967 kg (227 lb), SpO2 98 %.Body mass index is 30.78 kg/(m^2).  General Appearance: Casual  Eye Contact::  Fair  Speech:  Slow  Volume:  Decreased  Mood:  Dysphoric  Affect:  Constricted  Thought Process:  Tangential  Orientation:  Full (Time, Place, and Person)  Thought Content:  Rumination  Suicidal Thoughts:  No  Homicidal Thoughts:  No  Memory:  Immediate;   Fair Recent;   Fair Remote;   Fair  Judgement:  Poor  Insight:  Fair  Psychomotor Activity:  Psychomotor Retardation  Concentration:  Fair  Recall:  [employer] of Knowledge:Fair  Language: Fair  Akathisia:  No  Handed:  Right  AIMS (if indicated):     Assets:  Desire for Improvement Resilience Social Support  ADL's:  Impaired  Cognition: Impaired,  Mild  Sleep:  Number of Hours: 6.75  Unfortunately it appears that the ECT was probably helping however it was not helping well enough to get him back to functionality. Without it however he  seems to be getting even worse and may be slipping towards catatonia. Very troubling situation. Not clear how we should be proceeding. I would like to try to expedite him to longer term hospital. No obvious indication to change medicine today. Big patient to try and get up out of bed and eat a little better. Will continue to follow. 01/22/2016, 7:53 PM

## 2016-01-22 NOTE — Procedures (Signed)
ELECTROENCEPHALOGRAM REPORT   Patient: Christian [name]       Room #: 302A-AA EEG No. ID: [id number] Age: 65 y.o.        Sex: male Referring Physician: Clapacs Report Date:  01/22/2016        Interpreting Physician: Thana Farr  History: [name] is an 65 y.o. male with depression and tremor  Medications:  Scheduled: . allopurinol  100 mg Oral Daily  . apixaban  5 mg Oral BID  . aspirin EC  81 mg Oral Daily  . atorvastatin  10 mg Oral q1800  . clonazePAM  0.5 mg Oral TID  . docusate sodium  100 mg Oral BID  . feeding supplement (GLUCERNA SHAKE)  237 mL Oral TID BM  . finasteride  5 mg Oral Daily  . insulin aspart  0-9 Units Subcutaneous TID WC  . metFORMIN  500 mg Oral BID WC  . metoprolol tartrate  12.5 mg Oral BID  . mirtazapine  45 mg Oral QHS  . multivitamin with minerals  1 tablet Oral Daily  . pantoprazole  40 mg Oral Daily  . polyethylene glycol  17 g Oral Daily  . QUEtiapine  400 mg Oral QHS  . tamsulosin  0.4 mg Oral QPC supper    Conditions of Recording:  This is a 16 channel EEG carried out with the patient in the awake and drowsy states.  Description:  The patient is only awake for brief periods during the recording.  The waking background activity during this time consists of a low voltage, symmetrical, fairly well organized, 8 Hz alpha activity, seen from the parieto-occipital and posterior temporal regions.  Low voltage fast activity, poorly organized, is seen anteriorly and is at times superimposed on more posterior regions.  A mixture of theta and alpha rhythms are seen from the central and temporal regions. The patient drowses during the majority of the recording with slowing to irregular, low voltage theta and beta activity.   Stage II sleep is not obtained. No epileptiform activity is noted.   Hyperventilation and intermittent photic stimulation were not performed.   IMPRESSION: Normal awake and drowsy  electroencephalogram. There are no focal  lateralizing or epileptiform features.   Thana Farr, MD Neurology [phone] 01/22/2016, 12:53 PM

## 2016-01-22 NOTE — [facility] Group Notes (Signed)
[facility] LCSW Group Therapy  01/22/2016 4:05 PM  Type of Therapy:  Group Therapy  Participation Level:  Did Not Attend  Summary of Progress/Problems: Patient was called to group but did not attend.  Keiara Sneeringer T, MSW, LCSWA 01/22/2016, 4:05 PM  

## 2016-01-22 NOTE — Plan of Care (Signed)
Problem: Alteration in mood Goal: STG-Patient reports thoughts of self-harm to staff Outcome: Progressing Denies SI

## 2016-01-22 NOTE — Progress Notes (Signed)
Recreation Therapy Notes  Date: 02.23.17 Time: 3:00 pm Location: Community Room  Group Topic: Leisure Education  Goal Area(s) Addresses:  Patient will identify activities for each letter of the alphabet. Patient will verbalize ability to integrate positive leisure into life post d/c. Patient will verbalize ability to use leisure as a Associate Professor.  Behavioral Response: Did not attend  Intervention: Leisure Alphabet  Activity: Patients were given a Leisure Alphabet and instructed to think of a healthy leisure activity for each letter of the alphabet.  Education: LRT educated patients on what they need to participate in leisure.  Education Outcome: Patient did not attend group.  Clinical Observations/Feedback: Patient did not attend group.  Jacquelynn Cree, LRT/CTRS 01/22/2016 4:21 PM

## 2016-01-22 NOTE — Progress Notes (Signed)
D:  Per pt self inventory pt reports sleeping poor, appetite poor, energy level low, ability to pay attention poor, rates depression at an 8 out of 10, hopelessness at an 8 out of 10, anxiety at an 8 out of 10, denies SI/HI/AVH, goal today: "staying on point, following directions", flat/depressed during interaction.     A:  Emotional support provided, Encouraged pt to continue with treatment plan and attend all group activities, q15 min checks maintained for safety.  R:  Pt isolates in room, not going to groups, pleasant with staff and other patients on the unit.

## 2016-01-22 NOTE — [facility] Group Notes (Signed)
[facility] Group Notes:  (Nursing/MHT/Case Management/Adjunct)  Date:  01/22/2016  Time:  11:44 PM  Type of Therapy:  Psychoeducational Skills  Participation Level:  Did Not Attend   Summary of Progress/Problems:  Christian [name] 01/22/2016, 11:44 PM

## 2016-01-22 NOTE — Progress Notes (Signed)
D: Observed pt in room lying in bed awake. Patient alert and oriented x4. Patient denies SI/HI/AVH. Pt affect is depressed. Pt rated depression 8/10 and anxiety 8/10. Pt still indicates that his mood has not improved at all since he has been down here. Pt refused colace as he has been having regular bowel movements. Pt still exhibits moderate tremors. Pt denied having gone to any groups today and did not attend groups tonight. Pt isolates to room and forwards little. A: Offered active listening and support. Provided therapeutic communication. Administered scheduled medications. Educated pt on the benefits of physical activity and encouraged pt to be more physically active and go to at least two groups tomorrow. R: Pt did not respond when encouraged to attend groups tomorrow. Pt pleasant and cooperative. Pt medication compliant. Will continue Q15 min. checks. Safety maintained.

## 2016-01-22 NOTE — Progress Notes (Signed)
EEG completed, results pending. 

## 2016-01-22 NOTE — [facility] Group Notes (Signed)
[facility] Group Notes:  (Nursing/MHT/Case Management/Adjunct)  Date:  01/22/2016  Time:  2:00 PM  Type of Therapy:  Movement Therapy  Participation Level:  Did Not Attend  [name] [name] 01/22/2016, 2:00 PM

## 2016-01-23 ENCOUNTER — Inpatient Hospital Stay: Payer: Commercial Managed Care - HMO

## 2016-01-23 DIAGNOSIS — S01111A Laceration without foreign body of right eyelid and periocular area, initial encounter: Secondary | ICD-10-CM

## 2016-01-23 DIAGNOSIS — S01119A Laceration without foreign body of unspecified eyelid and periocular area, initial encounter: Secondary | ICD-10-CM | POA: Insufficient documentation

## 2016-01-23 DIAGNOSIS — W19XXXA Unspecified fall, initial encounter: Secondary | ICD-10-CM

## 2016-01-23 LAB — GLUCOSE, CAPILLARY
Glucose-Capillary: 160 mg/dL — ABNORMAL HIGH (ref 65–99)
Glucose-Capillary: 218 mg/dL — ABNORMAL HIGH (ref 65–99)
Glucose-Capillary: 165 mg/dL — ABNORMAL HIGH (ref 65–99)
Glucose-Capillary: 176 mg/dL — ABNORMAL HIGH (ref 65–99)

## 2016-01-23 MED ORDER — LIDOCAINE-EPINEPHRINE 1 %-1:100000 IJ SOLN
10.0000 mL | Freq: Once | INTRAMUSCULAR | Status: AC
  Administered 2016-01-23: 8 mL via INTRADERMAL
  Filled 2016-01-23: qty 10

## 2016-01-23 MED ORDER — SODIUM CHLORIDE FLUSH 0.9 % IV SOLN
INTRAVENOUS | Status: AC
  Filled 2016-01-23: qty 3

## 2016-01-23 MED ORDER — SODIUM CHLORIDE FLUSH 0.9 % IV SOLN
INTRAVENOUS | Status: AC
  Filled 2016-01-23: qty 9

## 2016-01-23 NOTE — Progress Notes (Signed)
MD on call notified that patient vomited x 1, also to request that she call on call hospitalist directly .

## 2016-01-23 NOTE — Clinical Social Work Note (Signed)
Clinical Social Work Assessment  Patient Details  Name: Christian [name] MRN: [id number] Date of Birth: [birth year]  CSW met with patient and his wife Christian [name] for a family meeting and discussed POA as Christian [name] has documents together but need to be signed. Family agreed to sign POA after patient discharges from BMU. Patient is on [facility] wait list and was discussed and assisted living or family care home is the plan when patient has completed inpatient treatments. Patient has a knot on his head from a fall this morning as patient was thought to be doing better walking without a walker yesterday and fell this morning and is now on fall precautions using a wheel chair. Patient's wife did not seem too concerned and was comfortable with care patient is receiving. Patient did not speak much when asked questions but was able to talk about sleeping well and responded that he would go to groups but said he has not been going but not much verbal from patient. Patient's wife appeared to be very caring for patient.  Christian Breath, LCSW 01/23/2016, 3:03 PM [phone]

## 2016-01-23 NOTE — Progress Notes (Signed)
Notified Dr. Toni Amend of the following:  Per Surgeon Dr. Everlene Farrier, dressing/Bandaid needs to stay on laceration until Sunday and sutures need to be removed on Thursday next week.

## 2016-01-23 NOTE — Progress Notes (Signed)
Writer called pt's wife to notify of fall. No answer. Writer did leave a voicemail for her.

## 2016-01-23 NOTE — Progress Notes (Signed)
Patient ID: Christian [name], male   DOB: October 14, 1951, 65 y.o.   MRN: [id number] [facility] PROGRESS NOTE  Christian [name] SKA:[id number] DOB: [birth year] DOA: 12/22/2015 PCP: Leanor Rubenstein, MD  HPI/Subjective: .fall overnight, laceration above right eyebrow Patient actually denies complaints  Objective: Filed Vitals:   01/23/16 1016 01/23/16 1124  BP: 140/78 124/73  Pulse: 87 90  Temp: 97.5 F (36.4 C) 97.7 F (36.5 C)  Resp: 18 20    Filed Weights   01/09/16 0827 01/12/16 1103 01/16/16 0836  Weight: 102.967 kg (227 lb) 104.327 kg (230 lb) 102.967 kg (227 lb)    ROS: Review of Systems  Constitutional: Negative for fever and chills.  Eyes: Negative for blurred vision.  Respiratory: Negative for cough and shortness of breath.   Cardiovascular: Negative for chest pain.  Gastrointestinal: Negative for nausea, vomiting, abdominal pain, diarrhea and constipation.  Genitourinary: Negative for dysuria.  Musculoskeletal: Negative for joint pain.  Neurological: Positive for tremors. Negative for dizziness and headaches.   Exam: Physical Exam  HENT:  Nose: No mucosal edema.  Mouth/Throat: No oropharyngeal exudate or posterior oropharyngeal edema.  Eyes: Conjunctivae, EOM and lids are normal. Pupils are equal, round, and reactive to light.  Neck: No JVD present. Carotid bruit is not present. No edema present. No thyroid mass and no thyromegaly present.  Cardiovascular: S1 normal and S2 normal.  Exam reveals no gallop.   No murmur heard. Pulses:      Dorsalis pedis pulses are 2+ on the right side, and 2+ on the left side.  Respiratory: No respiratory distress. He has no wheezes. He has no rhonchi. He has no rales.  GI: Soft. Bowel sounds are normal. There is no tenderness.  Musculoskeletal:       Right ankle: He exhibits swelling.       Left ankle: He exhibits swelling.  Lymphadenopathy:    He has no cervical adenopathy.  Neurological: He is alert. No cranial nerve  deficit.  Positive tremor bilateral upper extremities  Skin: Skin is warm. No rash noted. Nails show no clubbing.  Psychiatric: He has a normal mood and affect.      Data Reviewed: Basic Metabolic Panel:  Recent Labs Lab 01/18/16 0618 01/19/16 0657 01/20/16 0627 01/22/16 0655  NA 135 132* 135  --   K 5.0 4.6 4.4  --   CL 99* 98* 100*  --   CO2 30 30 31   --   GLUCOSE 92 92 110*  --   BUN 62* 57* 45*  --   CREATININE 2.37* 1.87* 1.45* 1.28*  CALCIUM 9.6 9.0 9.3  --    Liver Function Tests:  Recent Labs Lab 01/18/16 0618  AST 11*  ALT 11*  ALKPHOS 77  BILITOT 0.5  PROT 6.6  ALBUMIN 3.7   CBC:  Recent Labs Lab 01/19/16 0657 01/22/16 0655  WBC 4.5 4.5  HGB 12.4* 13.6  HCT 37.3* 40.3  MCV 87.1 86.9  PLT 139* 184    CBG:  Recent Labs Lab 01/22/16 1135 01/22/16 1655 01/22/16 2109 01/23/16 0606 01/23/16 1156  GLUCAP 191* 112* 171* 160* 218*    Scheduled Meds: . allopurinol  100 mg Oral Daily  . apixaban  5 mg Oral BID  . aspirin EC  81 mg Oral Daily  . atorvastatin  10 mg Oral q1800  . clonazePAM  0.5 mg Oral TID  . docusate sodium  100 mg Oral BID  . finasteride  5 mg Oral Daily  .  insulin aspart  0-9 Units Subcutaneous TID WC  . metFORMIN  500 mg Oral BID WC  . metoprolol tartrate  12.5 mg Oral BID  . mirtazapine  45 mg Oral QHS  . multivitamin with minerals  1 tablet Oral Daily  . pantoprazole  40 mg Oral Daily  . polyethylene glycol  17 g Oral Daily  . QUEtiapine  400 mg Oral QHS  . sodium chloride flush      . sodium chloride flush      . tamsulosin  0.4 mg Oral QPC supper    Assessment/Plan: 1. mechanical fall: CT head performed no acute events, patient neurologically intact this morning can have surgery address need for suture 2. 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 resolvedcheck BMP tomorrow if stable restart lisinopril Lasix 1. Tremor upper extremities. Seen by neuro, can consider cogentin if bothersome to  patient 2. Essential hypertension- continue metoprolol. 3. Type 2 diabetes mellitus. Restarted Glucophage. 4. History of DVT on eliquis 5. BPH continue finasteride and Flomax. 6. Weakness- physical therapy evaluation appreciated  Code Status:     Code Status Orders        Start     Ordered   12/22/15 1338  Full code   Continuous     12/22/15 1337    Code Status History    Date Active Date Inactive Code Status Order ID Comments User Context   12/22/2015  8:30 AM 12/22/2015  1:37 PM Full Code [id number]  Audery Amel, MD ED     Disposition: As per psychiatry  Consultants:  Neurology  Time spent:20 minutes   [name],  [name]  [facility]

## 2016-01-23 NOTE — Progress Notes (Signed)
Physical Therapy Treatment Patient Details Name: Christian [name] MRN: [id number] DOB: 11-19-1951 Today's Date: 01/23/2016    History of Present Illness Pt is a 65 y.o. male with PMH of bipolar disorder, mood swings, bilateral LE edema, diabetes, HTN B PE's, B LE DVT's, orthostatic hypotension, and cardiac arrhythmia.  Pt also with extended h/o ECT treatments.  Pt presented with mood swings and SOB.  Pt noted to have developed jerking in arms over last week.  Pt s/p fall 01/23/16.    PT Comments    Nursing reporting pt with fall this morning.  Pt able to ambulate 120 feet with RW min assist (pt more unsteady with turns and pt noted with more narrow BOS and L lean ambulating in hallway in general compared to initial eval Monday).  Will continue to progress pt with balance and improve gait mechanics and independence with functional mobility.   Follow Up Recommendations  Home health PT     Equipment Recommendations  Rolling walker with 5" wheels    Recommendations for Other Services       Precautions / Restrictions Precautions Precautions: Fall Restrictions Weight Bearing Restrictions: No    Mobility  Bed Mobility Overal bed mobility: Independent                Transfers Overall transfer level: Needs assistance Equipment used: Rolling walker (2 wheeled) Transfers: Sit to/from Stand Sit to Stand: Min guard         General transfer comment: steady without loss of balance  Ambulation/Gait Ambulation/Gait assistance: Min assist Ambulation Distance (Feet): 120 Feet Assistive device: Rolling walker (2 wheeled)       General Gait Details: narrow BOS (close to scissoring at times); L lean and standing to L side of walker noted requiring vc's to correct this and also increase BOS   Stairs            Wheelchair Mobility    Modified Rankin (Stroke Patients Only)       Balance Overall balance assessment: Needs assistance Sitting-balance support: No upper  extremity supported;Feet supported Sitting balance-Leahy Scale: Good     Standing balance support: Bilateral upper extremity supported (on RW) Standing balance-Leahy Scale: Good                      Cognition Arousal/Alertness: Awake/alert Behavior During Therapy: Flat affect Overall Cognitive Status: Within Functional Limits for tasks assessed                      Exercises      General Comments General comments (skin integrity, edema, etc.): R eye bruise and swelling with bandage above R eye  Nursing cleared pt for participation in physical therapy.  Pt agreeable to PT session.      Pertinent Vitals/Pain Pain Assessment: 0-10 Pain Score: 8  Pain Location: R eye/forehead Pain Descriptors / Indicators: Aching;Tender Pain Intervention(s): Limited activity within patient's tolerance;Monitored during session;Repositioned    Home Living                      Prior Function            PT Goals (current goals can now be found in the care plan section) Acute Rehab PT Goals Patient Stated Goal: to go home  PT Goal Formulation: With patient Time For Goal Achievement: 02/02/16 Potential to Achieve Goals: Fair Progress towards PT goals: Progressing toward goals    Frequency  Min 2X/week  PT Plan Current plan remains appropriate    Co-evaluation             End of Session Equipment Utilized During Treatment: Gait belt Activity Tolerance: Patient tolerated treatment well Patient left: in bed (with alarm applied)     Time: 1540-1555 PT Time Calculation (min) (ACUTE ONLY): 15 min  Charges:  $Gait Training: 8-22 mins                    G CodesHendricks Limes 02-17-16, 4:40 PM Hendricks Limes, PT [phone]

## 2016-01-23 NOTE — Progress Notes (Signed)
Patient was noted to have 1 episode of vomiting, patient is still alert and oriented 3-4, waiting on arrival of the hospital ist will continue to monitor closely.

## 2016-01-23 NOTE — Progress Notes (Signed)
Patient was noted laying on the floor in a pool of blood, laceration on right side of his eyebrow pressure applied, in addition to steri strips.   PERRLA,  Able to follow simple commands,face is symmetrical no ptosis noted, bilateral hand grips strong, patient was assisted back to bed, full ROME X 4, MD on call notified; ordered CT SCAN, and hospital ist consult STAT.

## 2016-01-23 NOTE — Progress Notes (Signed)
Per Surgeon Dr. Everlene Farrier, dressing/Bandaid needs to stay on laceration until Sunday and sutures need to be removed on Thursday next week.
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
## 2016-01-23 NOTE — Progress Notes (Signed)
Recreation Therapy Notes  Date: 02.24.17 Time: 3:10 pm Location: Community Room  Group Topic: Coping Skills  Goal Area(s) Addresses:  Patient will participate in a coping skill. Patient will verbalize benefit of using art as a coping skill.  Behavioral Response: Did not attend  Intervention: Coloring  Activity: Patients were given coloring sheets and instructed to color while thinking about the emotions they are feeling and what their mind is focused on.  Education: LRT educated patients on healthy coping skills.  Education Outcome: Patient did not attend group.  Clinical Observations/Feedback: Patient did not attend group.  Jacquelynn Cree, LRT/CTRS 01/23/2016 4:17 PM

## 2016-01-23 NOTE — Plan of Care (Signed)
Problem: Alteration in mood Goal: LTG-Pt's behavior demonstrates decreased signs of depression (Patient's behavior demonstrates decreased signs of depression to the point the patient is safe to return home and continue treatment in an outpatient setting)  Outcome: Not Progressing Pt is withdrawn this evening. He continues to rate depression as an 8 out of 10.  Problem: Diagnosis: Increased Risk For Suicide Attempt Goal: STG-Patient Will Attend All Groups On The Unit Outcome: Not Progressing Pt does not attend groups on the unit.

## 2016-01-23 NOTE — Progress Notes (Signed)
Pt found on floor by NT. Pt states that he was "walking to the bathroom and lost my balance." Pt c/o light headedness. Upon assessment, pt was noted to be lying in a pool of blood with his right temple on the floor. Pt was alert and oriented x4. A laceration was noted over pt's right eyebrow area. Pressure applied, area cleaned with gauze and saline, and steri-strips applied. MD on call notified. CT scan of the head and inpatient hospitalist consult ordered. Neuro exam performed by RN. PERRLA. Vital signs and CBG checked, WNL. Pt appeared tired. Pt assisted up to wheelchair and placed on bed alarm. Will continue to monitor.

## 2016-01-23 NOTE — Progress Notes (Signed)
D: Pt remains withdrawn this evening and is seen in his room lying awake in bed. Pt rates his depression as an 8 out of 10, stating that it has not changed. Pt refused evening dose of colace, reporting that he has regular bowel movements. He reports that his last bowel movement was "2 days ago." Denies SI/HI/AVH at this time. Denies pain.  A: Emotional support and encouragement provided. Medications administered with education. q15 minute safety checks maintained. R: Pt remains free from harm. Will continue to monitor.

## 2016-01-23 NOTE — BHH Group Notes (Signed)
[facility] LCSW Group Therapy  01/23/2016 2:35 PM  Patient was called to group but did not attend.  Jenel Lucks, CSW Intern 01/23/2016, 2:35 PM  Beryl Meager, MSW, LCSWA 01/23/2016, 3:02 PM

## 2016-01-23 NOTE — Progress Notes (Signed)
[facility] MD Progress Note  01/23/2016 5:30 PM Christian [name]  MRN:  [id number] Subjective: Follow-up Friday the 24th. Early this morning the patient had a fall in his room and received a cut to his head. CT reviewed shows no intracranial process and no fracture. Surgery came and sutures a wound late morning. Patient has moderate pain no other new complaints. Mood continues to be very depressed. He is barely getting out of bed. Patient had a meeting today with his wife and social work to discuss discharge plans. Principal Problem: Bipolar disorder, current episode depressed, severe, with psychotic features ([place]) Diagnosis:   Patient Active Problem List   Diagnosis Date Noted  . Bipolar I disorder, most recent episode depressed, severe w psychosis ([place]) [F31.5] 12/22/2015  . Bipolar affective disorder, depressed, severe, with psychotic behavior ([place]) [F31.5]   . Bipolar I disorder, current or most recent episode depressed, with psychotic features ([place]) [F31.5]   . Bipolar I disorder, most recent episode depressed ([place]) [F31.30]   . Severe bipolar I disorder with depression ([place]) [F31.4]   . Bipolar I disorder, most recent episode depressed, severe with psychotic features ([place]) [F31.5]   . Overdose [T50.901A] 09/11/2015  . Acute respiratory failure with hypoxia ([place]) [J96.01] 09/05/2015  . Elevated troponin [R79.89] 09/05/2015  . Somnolence [R40.0] 09/05/2015  . Bipolar depression ([place]) [F31.30] 09/05/2015  . Acute bilateral deep vein thrombosis (DVT) of femoral veins (HCC) [I82.413] 09/05/2015  . Bilateral pulmonary embolism ([place]) [I26.99] 09/02/2015  . Labile hypertension [I10]   . Pulmonary embolism with acute cor pulmonale ([place]) [I26.09]   . Dyspnea [R06.00]   . Orthostatic hypotension [I95.1]   . Bipolar disorder, in partial remission, most recent episode depressed ([place]) [F31.75]   . Syncope, near [R55] 08/31/2015  . Affective psychosis, bipolar ([place]) [F31.9]   . Bipolar disorder, now  depressed ([place]) [F31.30] 08/28/2015  . Bipolar I disorder, severe, current or most recent episode depressed, with psychotic features ([place]) [F31.5]   . Pressure ulcer [L89.90] 07/30/2015  . Protein-calorie malnutrition, severe ([place]) [E43] 07/30/2015  . Dehydration [E86.0] 07/29/2015  . Bipolar disorder, current episode depressed, severe, with psychotic features ([place]) [F31.5] 07/09/2015  . Diabetes ([place]) [E11.9] 07/09/2015  . UTI (urinary tract infection) [N39.0] 06/19/2014  . Positive blood culture [R78.81] 06/19/2014  . Bipolar affective disorder, current episode manic with psychotic symptoms ([place]) [F31.2] 06/17/2014  . Type II or unspecified type diabetes mellitus with unspecified complication, uncontrolled [E11.8, E11.65] 06/02/2014  . Other and unspecified hyperlipidemia [E78.5] 06/02/2014  . OSA on CPAP [G47.33] 06/02/2014  . 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 Gout [M10.9] 06/02/2014  . Hypertension [I10]    Total Time spent with patient: 30 minutes  Past Psychiatric History: Long history of bipolar disorder with history of suicide attempts and now a history of aggression  Past Medical History:  Past Medical History  Diagnosis Date  . Mood swings ([place])   . Type II or unspecified type diabetes mellitus with unspecified complication, uncontrolled 06/02/2014  . Other and unspecified hyperlipidemia 06/02/2014  . OSA on CPAP 06/02/2014  . 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 Bipolar disorder ([place])   . Hypertension   . Gout   . Bipolar 1 disorder ([place])   . Hx of blood clots   . Chronic kidney disease    History reviewed. No pertinent past surgical history. Family History:  Family History  Problem Relation Age of Onset  .  Dementia Mother   . Arthritis Father   . Hypertension Father   . Heart failure Neg Hx   . Kidney failure Neg Hx   . Cancer Neg Hx   . Mental illness Sister   .  Diabetes Sister    Family Psychiatric  History: Positive for bipolar disorder and anxiety Social History:  History  Alcohol Use No     History  Drug Use No    Social History   Social History  . Marital Status: Married    Spouse Name: N/A  . Number of Children: N/A  . Years of Education: N/A   Social History Main Topics  . Smoking status: Never Smoker   . Smokeless tobacco: None  . Alcohol Use: No  . Drug Use: No  . Sexual Activity: Not Currently   Other Topics Concern  . None   Social History Narrative   *The patient has been married for over 30 years and is currently on disability. He lives with his wife in the [place] area and has one daughter.             Additional Social History:                         Sleep: Poor  Appetite:  Fair  Current Medications: Current Facility-Administered Medications  Medication Dose Route Frequency Provider Last Rate Last Dose  . acetaminophen (TYLENOL) tablet 650 mg  650 mg Oral Q6H PRN Gonzella Lex, MD   650 mg at 01/18/16 2110  . allopurinol (ZYLOPRIM) tablet 100 mg  100 mg Oral Daily Loletha Grayer, MD   100 mg at 01/23/16 0941  . alum & mag hydroxide-simeth (MAALOX/MYLANTA) 200-200-20 MG/5ML suspension 30 mL  30 mL Oral Q4H PRN Gonzella Lex, MD      . apixaban (ELIQUIS) tablet 5 mg  5 mg Oral BID Gonzella Lex, MD   5 mg at 01/23/16 0941  . aspirin EC tablet 81 mg  81 mg Oral Daily Gonzella Lex, MD   81 mg at 01/23/16 0941  . atorvastatin (LIPITOR) tablet 10 mg  10 mg Oral q1800 Gonzella Lex, MD   10 mg at 01/23/16 1658  . clonazePAM (KLONOPIN) tablet 0.5 mg  0.5 mg Oral TID Gonzella Lex, MD   0.5 mg at 01/23/16 1657  . dextrose 5 % solution   Intravenous Continuous Gonzella Lex, MD 10 mL/hr at 01/12/16 1130    . docusate sodium (COLACE) capsule 100 mg  100 mg Oral BID Gonzella Lex, MD   100 mg at 01/23/16 0941  . finasteride (PROSCAR) tablet 5 mg  5 mg Oral Daily Gonzella Lex, MD   5 mg at  01/23/16 0941  . insulin aspart (novoLOG) injection 0-9 Units  0-9 Units Subcutaneous TID WC Chauncey Mann, MD   2 Units at 01/23/16 1658  . magnesium hydroxide (MILK OF MAGNESIA) suspension 30 mL  30 mL Oral Daily PRN Gonzella Lex, MD   30 mL at 01/08/16 2315  . metFORMIN (GLUCOPHAGE-XR) 24 hr tablet 500 mg  500 mg Oral BID WC Loletha Grayer, MD   500 mg at 01/23/16 1658  . metoprolol tartrate (LOPRESSOR) tablet 12.5 mg  12.5 mg Oral BID Loletha Grayer, MD   12.5 mg at 01/23/16 0944  . mirtazapine (REMERON) tablet 45 mg  45 mg Oral QHS Gonzella Lex, MD   45 mg at 01/22/16 2114  .  multivitamin with minerals tablet 1 tablet  1 tablet Oral Daily Hildred Priest, MD   1 tablet at 01/23/16 0941  . ondansetron (ZOFRAN-ODT) disintegrating tablet 4 mg  4 mg Oral Q8H PRN Gonzella Lex, MD   4 mg at 01/09/16 1340  . ondansetron (ZOFRAN-ODT) disintegrating tablet 4 mg  4 mg Oral Q8H PRN Gonzella Lex, MD   4 mg at 01/16/16 1739  . pantoprazole (PROTONIX) EC tablet 40 mg  40 mg Oral Daily Gonzella Lex, MD   40 mg at 01/23/16 0941  . polyethylene glycol (MIRALAX / GLYCOLAX) packet 17 g  17 g Oral Daily Gonzella Lex, MD   17 g at 01/22/16 0913  . QUEtiapine (SEROQUEL) tablet 400 mg  400 mg Oral QHS Gonzella Lex, MD   400 mg at 01/22/16 2112  . sodium chloride flush 0.9 % injection           . sodium chloride flush 0.9 % injection           . tamsulosin (FLOMAX) capsule 0.4 mg  0.4 mg Oral QPC supper Gonzella Lex, MD   0.4 mg at 01/23/16 1657    Lab Results:  Results for orders placed or performed during the hospital encounter of 12/22/15 (from the past 48 hour(s))  Glucose, capillary     Status: Abnormal   Collection Time: 01/21/16  8:43 PM  Result Value Ref Range   Glucose-Capillary 154 (H) 65 - 99 mg/dL  Glucose, capillary     Status: Abnormal   Collection Time: 01/22/16  6:48 AM  Result Value Ref Range   Glucose-Capillary 126 (H) 65 - 99 mg/dL  Creatinine, serum     Status:  Abnormal   Collection Time: 01/22/16  6:55 AM  Result Value Ref Range   Creatinine, Ser 1.28 (H) 0.61 - 1.24 mg/dL   GFR calc non Af Amer 58 (L) >60 mL/min   GFR calc Af Amer >60 >60 mL/min    Comment: (NOTE) The eGFR has been calculated using the CKD EPI equation. This calculation has not been validated in all clinical situations. eGFR's persistently <60 mL/min signify possible Chronic Kidney Disease.   CBC     Status: None   Collection Time: 01/22/16  6:55 AM  Result Value Ref Range   WBC 4.5 3.8 - 10.6 K/uL   RBC 4.64 4.40 - 5.90 MIL/uL   Hemoglobin 13.6 13.0 - 18.0 g/dL   HCT 40.3 40.0 - 52.0 %   MCV 86.9 80.0 - 100.0 fL   MCH 29.3 26.0 - 34.0 pg   MCHC 33.7 32.0 - 36.0 g/dL   RDW 13.6 11.5 - 14.5 %   Platelets 184 150 - 440 K/uL  Glucose, capillary     Status: Abnormal   Collection Time: 01/22/16 11:35 AM  Result Value Ref Range   Glucose-Capillary 191 (H) 65 - 99 mg/dL  Glucose, capillary     Status: Abnormal   Collection Time: 01/22/16  4:55 PM  Result Value Ref Range   Glucose-Capillary 112 (H) 65 - 99 mg/dL  Glucose, capillary     Status: Abnormal   Collection Time: 01/22/16  9:09 PM  Result Value Ref Range   Glucose-Capillary 171 (H) 65 - 99 mg/dL  Glucose, capillary     Status: Abnormal   Collection Time: 01/23/16  6:06 AM  Result Value Ref Range   Glucose-Capillary 160 (H) 65 - 99 mg/dL  Glucose, capillary     Status: Abnormal  Collection Time: 01/23/16 11:56 AM  Result Value Ref Range   Glucose-Capillary 218 (H) 65 - 99 mg/dL  Glucose, capillary     Status: Abnormal   Collection Time: 01/23/16  4:43 PM  Result Value Ref Range   Glucose-Capillary 165 (H) 65 - 99 mg/dL    Physical Findings: AIMS: Facial and Oral Movements Muscles of Facial Expression: None, normal Lips and Perioral Area: None, normal Jaw: None, normal Tongue: None, normal,Extremity Movements Upper (arms, wrists, hands, fingers): None, normal Lower (legs, knees, ankles, toes): None,  normal, Trunk Movements Neck, shoulders, hips: None, normal, Overall Severity Severity of abnormal movements (highest score from questions above): None, normal Incapacitation due to abnormal movements: None, normal Patient's awareness of abnormal movements (rate only patient's report): No Awareness, Dental Status Current problems with teeth and/or dentures?: No Does patient usually wear dentures?: No  CIWA:    COWS:     Musculoskeletal: Strength & Muscle Tone: decreased Gait & Station: shuffle Patient leans: N/A  Psychiatric Specialty Exam: Review of Systems  HENT: Negative.   Eyes: Negative.   Respiratory: Negative.   Cardiovascular: Negative.   Gastrointestinal: Negative.   Musculoskeletal: Negative.   Skin: Negative.   Neurological: Positive for weakness.  Psychiatric/Behavioral: Positive for depression and memory loss. Negative for suicidal ideas, hallucinations and substance abuse. The patient is nervous/anxious and has insomnia.     Blood pressure 120/75, pulse 90, temperature 98.1 F (36.7 C), temperature source Oral, resp. rate 18, height 6' (1.829 m), weight 102.967 kg (227 lb), SpO2 100 %.Body mass index is 30.78 kg/(m^2).  General Appearance: Casual  Eye Contact::  Fair  Speech:  Slow  Volume:  Decreased  Mood:  Dysphoric  Affect:  Constricted  Thought Process:  Tangential  Orientation:  Full (Time, Place, and Person)  Thought Content:  Rumination  Suicidal Thoughts:  No  Homicidal Thoughts:  No  Memory:  Immediate;   Fair Recent;   Fair Remote;   Fair  Judgement:  Poor  Insight:  Fair  Psychomotor Activity:  Psychomotor Retardation  Concentration:  Fair  Recall:  [employer] of Knowledge:Fair  Language: Fair  Akathisia:  No  Handed:  Right  AIMS (if indicated):     Assets:  Desire for Improvement Resilience Social Support  ADL's:  Impaired  Cognition: Impaired,  Mild  Sleep:  Number of Hours: 7  No change to current medication. Blood pressure  under good control and blood sugars okay. Patient will continue to be watched closely by nursing. [facility] we are also going to look into the possibility of other placement. Spoke with patient today and once again encouraged him to please try and get up and get out of bed and be more active. 01/23/2016, 5:30 PM

## 2016-01-23 NOTE — Progress Notes (Signed)
Per Dr. Clint Guy pt can continue taking his medications as ordered including Elaquis and Aspirin.

## 2016-01-23 NOTE — Progress Notes (Signed)
Spoke with Dr. Toni Amend concerning pt's laceration over his rt eye, per Clapacs, Medical MD was called and said Surgery MD would need to be called, Surgery MD was called and said that Dr. Toni Amend would need to assess the patient first, per Dr. Toni Amend notified Supervisor of situation and she states to have Dr. Toni Amend call ENT MD to come assess and stitch laceration if needed, laceration is currently closed with steri strips.

## 2016-01-23 NOTE — [facility] Group Notes (Signed)
[facility] Ms LCSW Aftercare Discharge Planning Group Note   01/23/2016 3:07 PM  Participation Quality:  Patient was called to group but did not attend.    Lulu Riding, MSW, LCSWA

## 2016-01-23 NOTE — [facility] Group Notes (Signed)
[facility] Group Notes:  (Nursing/MHT/Case Management/Adjunct)  Date:  01/23/2016  Time:  11:43 AM  Type of Therapy:  Psychoeducational Skills  Participation Level:  Did Not Attend    Christian [name] M Moses Ellison 01/23/2016, 11:43 AM 

## 2016-01-23 NOTE — Consult Note (Signed)
Patient ID: Christian [name], male   DOB: 02/08/1951, 65 y.o.   MRN: [id number]  HPI Christian [name] is a 65 y.o. male   HPI To see patient by psychiatry staff regarding a fall. Patient had all early this morning, this was unwitnessed as per the patient he states that he tripped on something and then take the right eyebrow. He now reports moderate pain and some swelling on the right eye. Pain is worsening when he presses on this area. There was some bleeding and the nursing staff placed a gauze and a compression dressing. Since then he is a well. A CT scan of the head was obtained showing no evidence of intracranial bleed and there is some mild soft tissue edema on the right eyebrow. He did have a laceration measuring 35 mm. He is anticoagulated, he does not have any neurological symptoms and denied any other pain other than the eyebrow discomfort Using the psychiatric workup for severe depression and has been getting ECT treatments. He seems to be competent  Past Medical History  Diagnosis Date  . Mood swings (HCC)   . Type II or unspecified type diabetes mellitus with unspecified complication, uncontrolled 06/02/2014  . Other and unspecified hyperlipidemia 06/02/2014  . OSA on CPAP 06/02/2014  . 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 Bipolar disorder (HCC)   . Hypertension   . Gout   . Bipolar 1 disorder (HCC)   . Hx of blood clots   . Chronic kidney disease     History reviewed. No pertinent past surgical history.  Family History  Problem Relation Age of Onset  . Dementia Mother   . Arthritis Father   . Hypertension Father   . Heart failure Neg Hx   . Kidney failure Neg Hx   . Cancer Neg Hx   . Mental illness Sister   . Diabetes Sister     Social History Social History  Substance Use Topics  . Smoking status: Never Smoker   . Smokeless tobacco: None  . Alcohol Use: No    No Known Allergies  Current Facility-Administered Medications   Medication Dose Route Frequency Provider Last Rate Last Dose  . acetaminophen (TYLENOL) tablet 650 mg  650 mg Oral Q6H PRN Audery Amel, MD   650 mg at 01/18/16 2110  . allopurinol (ZYLOPRIM) tablet 100 mg  100 mg Oral Daily Alford Highland, MD   100 mg at 01/23/16 0941  . alum & mag hydroxide-simeth (MAALOX/MYLANTA) 200-200-20 MG/5ML suspension 30 mL  30 mL Oral Q4H PRN Audery Amel, MD      . apixaban (ELIQUIS) tablet 5 mg  5 mg Oral BID Audery Amel, MD   5 mg at 01/23/16 0941  . aspirin EC tablet 81 mg  81 mg Oral Daily Audery Amel, MD   81 mg at 01/23/16 0941  . atorvastatin (LIPITOR) tablet 10 mg  10 mg Oral q1800 Audery Amel, MD   10 mg at 01/23/16 1658  . clonazePAM (KLONOPIN) tablet 0.5 mg  0.5 mg Oral TID Audery Amel, MD   0.5 mg at 01/23/16 1657  . dextrose 5 % solution   Intravenous Continuous Audery Amel, MD 10 mL/hr at 01/12/16 1130    . docusate sodium (COLACE) capsule 100 mg  100 mg Oral BID Audery Amel, MD   100 mg at 01/23/16 0941  . finasteride (PROSCAR) tablet 5 mg  5 mg Oral Daily John  T Clapacs, MD   5 mg at 01/23/16 0941  . insulin aspart (novoLOG) injection 0-9 Units  0-9 Units Subcutaneous TID WC Darliss Ridgel, MD   2 Units at 01/23/16 1658  . magnesium hydroxide (MILK OF MAGNESIA) suspension 30 mL  30 mL Oral Daily PRN Audery Amel, MD   30 mL at 01/08/16 2315  . metFORMIN (GLUCOPHAGE-XR) 24 hr tablet 500 mg  500 mg Oral BID WC Alford Highland, MD   500 mg at 01/23/16 1658  . metoprolol tartrate (LOPRESSOR) tablet 12.5 mg  12.5 mg Oral BID Alford Highland, MD   12.5 mg at 01/23/16 0944  . mirtazapine (REMERON) tablet 45 mg  45 mg Oral QHS Audery Amel, MD   45 mg at 01/22/16 2114  . multivitamin with minerals tablet 1 tablet  1 tablet Oral Daily Jimmy Footman, MD   1 tablet at 01/23/16 0941  . ondansetron (ZOFRAN-ODT) disintegrating tablet 4 mg  4 mg Oral Q8H PRN Audery Amel, MD   4 mg at 01/09/16 1340  . ondansetron (ZOFRAN-ODT)  disintegrating tablet 4 mg  4 mg Oral Q8H PRN Audery Amel, MD   4 mg at 01/16/16 1739  . pantoprazole (PROTONIX) EC tablet 40 mg  40 mg Oral Daily Audery Amel, MD   40 mg at 01/23/16 0941  . polyethylene glycol (MIRALAX / GLYCOLAX) packet 17 g  17 g Oral Daily Audery Amel, MD   17 g at 01/22/16 0913  . QUEtiapine (SEROQUEL) tablet 400 mg  400 mg Oral QHS Audery Amel, MD   400 mg at 01/22/16 2112  . sodium chloride flush 0.9 % injection           . sodium chloride flush 0.9 % injection           . tamsulosin (FLOMAX) capsule 0.4 mg  0.4 mg Oral QPC supper Audery Amel, MD   0.4 mg at 01/23/16 1657     Review of Systems A 10 point review of systems was asked and was negative except for the information on the HPI  Physical Exam Blood pressure 120/75, pulse 90, temperature 98.1 F (36.7 C), temperature source Oral, resp. rate 18, height 6' (1.829 m), weight 102.967 kg (227 lb), SpO2 100 %. CONSTITUTIONAL: No acute distress nontoxic EYES: Pupils are equal, round, and reactive to light, Sclera are non-icteric. There is some right palpebral edema on the right side with echymosis , no evidence of expanding hematoma. Visual field and EOM intact EARS, NOSE, MOUTH AND THROAT: The oropharynx is clear. The oral mucosa is pink and moist. Hearing is intact to voice. LYMPH NODES:  Lymph nodes in the neck are normal. Neck: no cervical spine tenderness, no JVD RESPIRATORY:  Lungs are clear. There is normal respiratory effort, with equal breath sounds bilaterally, and without pathologic use of accessory muscles. CARDIOVASCULAR: Heart is regular without murmurs, gallops, or rubs. GI: The abdomen is soft, nontender, and nondistended. There are no palpable masses. There is no hepatosplenomegaly. There are normal bowel sounds in all quadrants. GU: Rectal deferred.   MUSCULOSKELETAL: Normal muscle strength and tone. No cyanosis or edema.   SKIN: Turgor is good and there are no pathologic skin  lesions or ulcers. NEUROLOGIC: Motor and sensation is grossly normal. Cranial nerves are grossly intact. PSYCH:  Oriented to person, place and time. He is competent and understands when I explain things to him.  Data Reviewed CT I have personally reviewed the patient's  imaging, laboratory findings and medical records.    Assessment/Plan 65 year old male status post fall and a right eyebrow laceration and contusion without evidence of expanding hematoma or intracranial hemorrhage. No evidence of C-spine injury. I discussed with the patient in detail I do recommend cleaning the laceration and doing a simple repair. Procedure explained with the patient in detail his bed and possible complications he understands and wishes to proceed   Procedure Note Diagnosis:  Eyebrow laceration measuring 3.5 cm Procedure: Simple closure of 3.5 cm laceration right eyebrow Anesthetic: Lidocaine 1% with epinephrine 8 cc Indications: None  Patient was placed in supine position and Betadine was used to prep the patient. He was prepped and draped in the usual sterile fashion. Lidocaine 1% was injected over the laceration. Using normal saline we irrigated the wound and cleaned. Using interrupted 5-0 Prolene's we close the laceration in a standard fashion. And it was placed on top.   Wound recommendations. Patient will keep the Band-Aid for 48 hours and nurses may take/remove the stitches on Thursday morning. Discussed with nursing staff in detail. May use ice as an adjuvant to decrease inflammatory response. No need for any further workup.   Sterling Big, MD FACS General Surgeon 01/23/2016, 5:32 PM

## 2016-01-23 NOTE — Progress Notes (Signed)
Pt alert and oriented x4, PEARL, grips equal bilaterally, assisted pt to wheelchair with stand by assist, pt ate breakfast.

## 2016-01-24 LAB — GLUCOSE, CAPILLARY
Glucose-Capillary: 96 mg/dL (ref 65–99)
Glucose-Capillary: 226 mg/dL — ABNORMAL HIGH (ref 65–99)
Glucose-Capillary: 205 mg/dL — ABNORMAL HIGH (ref 65–99)
Glucose-Capillary: 135 mg/dL — ABNORMAL HIGH (ref 65–99)

## 2016-01-24 LAB — BASIC METABOLIC PANEL
Anion gap: 5 (ref 5–15)
BUN: 23 mg/dL — ABNORMAL HIGH (ref 6–20)
CO2: 25 mmol/L (ref 22–32)
Calcium: 8.9 mg/dL (ref 8.9–10.3)
Chloride: 102 mmol/L (ref 101–111)
Creatinine, Ser: 1.14 mg/dL (ref 0.61–1.24)
GFR calc Af Amer: >60 mL/min (ref >60)
GFR calc non Af Amer: >60 mL/min (ref >60)
Glucose, Bld: 109 mg/dL — ABNORMAL HIGH (ref 65–99)
Potassium: 5.1 mmol/L (ref 3.5–5.1)
Sodium: 132 mmol/L — ABNORMAL LOW (ref 135–145)

## 2016-01-24 MED ORDER — FUROSEMIDE 20 MG PO TABS
20.0000 mg | ORAL_TABLET | Freq: Every day | ORAL | Status: DC
  Administered 2016-01-24 – 2016-02-03 (×11): 20 mg via ORAL
  Filled 2016-01-24 (×11): qty 1

## 2016-01-24 MED ORDER — LISINOPRIL 20 MG PO TABS
40.0000 mg | ORAL_TABLET | Freq: Every day | ORAL | Status: DC
  Administered 2016-01-24 – 2016-01-28 (×5): 40 mg via ORAL
  Filled 2016-01-24 (×5): qty 2

## 2016-01-24 NOTE — Progress Notes (Signed)
Pt has been pleasant and cooperative. Pt has been seclusive to his room. No complaints  voiced at this time.

## 2016-01-24 NOTE — Progress Notes (Addendum)
**  Per Surgeon Dr. Everlene Farrier, dressing/Bandaid needs to stay on laceration until Sunday and sutures need to be removed on Thursday next week.**  D: Pt denies SI/AVH. Rated pain in Right knee a 6 from fall previous night. Pt needed much assistance when attempting to pull pants down in bathroom. He lost his balance and was assisted by staff to prevent another fall. Gait unsteady. A: Q15 minute checks maintained for safety. Close supervision provided. House supervisor notified of prevented fall to discuss possibility of obtaining a sitter. Medications given as prescribed. Encouragement and support provided. Pt educated to call for assistance and bed alarm remains in place. R: Pt remains safe on unit. Needs assistance to bathroom to prevent falls. Right eye swollen shut, dressing remains in place. No drainage noted. Minimal interaction. Pt verbalizes understanding to call staff for assistance out of bed. Pt A&O x4, PEARL, grips equal bilaterally, assisted pt to wheelchair with stand by assist. Pt requested and had evening snack with juice.

## 2016-01-24 NOTE — Progress Notes (Signed)
[facility] MD Progress Note  01/24/2016 6:49 PM Christian [name]  MRN:  [id number]  Subjective:  Mr. Muckleroy shows no improvement. He is lethargic but does get up for meals. Medicine consult is greatly appreciated.  Principal Problem: Bipolar disorder, current episode depressed, severe, with psychotic features ([place]) Diagnosis:   Patient Active Problem List   Diagnosis Date Noted  . Fall [W19.XXXA]   . Laceration of eyebrow [S01.119A]   . Bipolar I disorder, most recent episode depressed, severe w psychosis ([place]) [F31.5] 12/22/2015  . Bipolar affective disorder, depressed, severe, with psychotic behavior ([place]) [F31.5]   . Bipolar I disorder, current or most recent episode depressed, with psychotic features ([place]) [F31.5]   . Bipolar I disorder, most recent episode depressed ([place]) [F31.30]   . Severe bipolar I disorder with depression ([place]) [F31.4]   . Bipolar I disorder, most recent episode depressed, severe with psychotic features ([place]) [F31.5]   . Overdose [T50.901A] 09/11/2015  . Acute respiratory failure with hypoxia ([place]) [J96.01] 09/05/2015  . Elevated troponin [R79.89] 09/05/2015  . Somnolence [R40.0] 09/05/2015  . Bipolar depression ([place]) [F31.30] 09/05/2015  . Acute bilateral deep vein thrombosis (DVT) of femoral veins (HCC) [I82.413] 09/05/2015  . Bilateral pulmonary embolism ([place]) [I26.99] 09/02/2015  . Labile hypertension [I10]   . Pulmonary embolism with acute cor pulmonale ([place]) [I26.09]   . Dyspnea [R06.00]   . Orthostatic hypotension [I95.1]   . Bipolar disorder, in partial remission, most recent episode depressed ([place]) [F31.75]   . Syncope, near [R55] 08/31/2015  . Affective psychosis, bipolar ([place]) [F31.9]   . Bipolar disorder, now depressed ([place]) [F31.30] 08/28/2015  . Bipolar I disorder, severe, current or most recent episode depressed, with psychotic features ([place]) [F31.5]   . Pressure ulcer [L89.90] 07/30/2015  . Protein-calorie malnutrition, severe ([place]) [E43]  07/30/2015  . Dehydration [E86.0] 07/29/2015  . Bipolar disorder, current episode depressed, severe, with psychotic features ([place]) [F31.5] 07/09/2015  . Diabetes ([place]) [E11.9] 07/09/2015  . UTI (urinary tract infection) [N39.0] 06/19/2014  . Positive blood culture [R78.81] 06/19/2014  . Bipolar affective disorder, current episode manic with psychotic symptoms ([place]) [F31.2] 06/17/2014  . Type II or unspecified type diabetes mellitus with unspecified complication, uncontrolled [E11.8, E11.65] 06/02/2014  . Other and unspecified hyperlipidemia [E78.5] 06/02/2014  . OSA on CPAP [G47.33] 06/02/2014  . 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 Gout [M10.9] 06/02/2014  . Hypertension [I10]    Total Time spent with patient: 20 minutes  Past Psychiatric History: depression.  Past Medical History:  Past Medical History  Diagnosis Date  . Mood swings ([place])   . Type II or unspecified type diabetes mellitus with unspecified complication, uncontrolled 06/02/2014  . Other and unspecified hyperlipidemia 06/02/2014  . OSA on CPAP 06/02/2014  . 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 Bipolar disorder ([place])   . Hypertension   . Gout   . Bipolar 1 disorder ([place])   . Hx of blood clots   . Chronic kidney disease    History reviewed. No pertinent past surgical history. Family History:  Family History  Problem Relation Age of Onset  . Dementia Mother   . Arthritis Father   . Hypertension Father   . Heart failure Neg Hx   . Kidney failure Neg Hx   . Cancer Neg Hx   . Mental illness Sister   . Diabetes Sister    Family Psychiatric  History: sister with bipolar. Social History:  History  Alcohol Use No     History  Drug Use No    Social History   Social History  . Marital Status: Married    Spouse Name: N/A  . Number of Children: N/A  . Years of Education: N/A   Social History Main Topics  . Smoking  status: Never Smoker   . Smokeless tobacco: None  . Alcohol Use: No  . Drug Use: No  . Sexual Activity: Not Currently   Other Topics Concern  . None   Social History Narrative   *The patient has been married for over 30 years and is currently on disability. He lives with his wife in the [place] area and has one daughter.             Additional Social History:                         Sleep: Fair  Appetite:  Fair  Current Medications: Current Facility-Administered Medications  Medication Dose Route Frequency Provider Last Rate Last Dose  . acetaminophen (TYLENOL) tablet 650 mg  650 mg Oral Q6H PRN Gonzella Lex, MD   650 mg at 01/23/16 2212  . allopurinol (ZYLOPRIM) tablet 100 mg  100 mg Oral Daily Loletha Grayer, MD   100 mg at 01/24/16 0912  . alum & mag hydroxide-simeth (MAALOX/MYLANTA) 200-200-20 MG/5ML suspension 30 mL  30 mL Oral Q4H PRN Gonzella Lex, MD      . apixaban (ELIQUIS) tablet 5 mg  5 mg Oral BID Gonzella Lex, MD   5 mg at 01/24/16 0911  . aspirin EC tablet 81 mg  81 mg Oral Daily Gonzella Lex, MD   81 mg at 01/24/16 0911  . atorvastatin (LIPITOR) tablet 10 mg  10 mg Oral q1800 Gonzella Lex, MD   10 mg at 01/24/16 1645  . clonazePAM (KLONOPIN) tablet 0.5 mg  0.5 mg Oral TID Gonzella Lex, MD   0.5 mg at 01/24/16 1645  . dextrose 5 % solution   Intravenous Continuous Gonzella Lex, MD 10 mL/hr at 01/12/16 1130    . docusate sodium (COLACE) capsule 100 mg  100 mg Oral BID Gonzella Lex, MD   100 mg at 01/24/16 0912  . finasteride (PROSCAR) tablet 5 mg  5 mg Oral Daily Gonzella Lex, MD   5 mg at 01/24/16 0910  . furosemide (LASIX) tablet 20 mg  20 mg Oral Daily Lytle Butte, MD   20 mg at 01/24/16 1215  . insulin aspart (novoLOG) injection 0-9 Units  0-9 Units Subcutaneous TID WC Chauncey Mann, MD   3 Units at 01/24/16 1647  . lisinopril (PRINIVIL,ZESTRIL) tablet 40 mg  40 mg Oral Daily Lytle Butte, MD   40 mg at 01/24/16 1215  . magnesium  hydroxide (MILK OF MAGNESIA) suspension 30 mL  30 mL Oral Daily PRN Gonzella Lex, MD   30 mL at 01/08/16 2315  . metFORMIN (GLUCOPHAGE-XR) 24 hr tablet 500 mg  500 mg Oral BID WC Loletha Grayer, MD   500 mg at 01/24/16 1645  . metoprolol tartrate (LOPRESSOR) tablet 12.5 mg  12.5 mg Oral BID Loletha Grayer, MD   12.5 mg at 01/24/16 0913  . mirtazapine (REMERON) tablet 45 mg  45 mg Oral QHS Gonzella Lex, MD   45 mg at 01/23/16 2121  . multivitamin with minerals tablet 1 tablet  1 tablet Oral Daily Seth Bake  Hernandez-Gonzalez, MD   1 tablet at 01/24/16 0912  . ondansetron (ZOFRAN-ODT) disintegrating tablet 4 mg  4 mg Oral Q8H PRN Gonzella Lex, MD   4 mg at 01/09/16 1340  . ondansetron (ZOFRAN-ODT) disintegrating tablet 4 mg  4 mg Oral Q8H PRN Gonzella Lex, MD   4 mg at 01/16/16 1739  . pantoprazole (PROTONIX) EC tablet 40 mg  40 mg Oral Daily Gonzella Lex, MD   40 mg at 01/24/16 0912  . polyethylene glycol (MIRALAX / GLYCOLAX) packet 17 g  17 g Oral Daily Gonzella Lex, MD   17 g at 01/24/16 0910  . QUEtiapine (SEROQUEL) tablet 400 mg  400 mg Oral QHS Gonzella Lex, MD   400 mg at 01/23/16 2121  . tamsulosin (FLOMAX) capsule 0.4 mg  0.4 mg Oral QPC supper Gonzella Lex, MD   0.4 mg at 01/24/16 1646    Lab Results:  Results for orders placed or performed during the hospital encounter of 12/22/15 (from the past 48 hour(s))  Glucose, capillary     Status: Abnormal   Collection Time: 01/22/16  9:09 PM  Result Value Ref Range   Glucose-Capillary 171 (H) 65 - 99 mg/dL  Glucose, capillary     Status: Abnormal   Collection Time: 01/23/16  6:06 AM  Result Value Ref Range   Glucose-Capillary 160 (H) 65 - 99 mg/dL  Glucose, capillary     Status: Abnormal   Collection Time: 01/23/16 11:56 AM  Result Value Ref Range   Glucose-Capillary 218 (H) 65 - 99 mg/dL  Glucose, capillary     Status: Abnormal   Collection Time: 01/23/16  4:43 PM  Result Value Ref Range   Glucose-Capillary 165 (H) 65 -  99 mg/dL  Glucose, capillary     Status: Abnormal   Collection Time: 01/23/16  8:38 PM  Result Value Ref Range   Glucose-Capillary 176 (H) 65 - 99 mg/dL  Basic metabolic panel     Status: Abnormal   Collection Time: 01/24/16  6:56 AM  Result Value Ref Range   Sodium 132 (L) 135 - 145 mmol/L   Potassium 5.1 3.5 - 5.1 mmol/L   Chloride 102 101 - 111 mmol/L   CO2 25 22 - 32 mmol/L   Glucose, Bld 109 (H) 65 - 99 mg/dL   BUN 23 (H) 6 - 20 mg/dL   Creatinine, Ser 1.14 0.61 - 1.24 mg/dL   Calcium 8.9 8.9 - 10.3 mg/dL   GFR calc non Af Amer >60 >60 mL/min   GFR calc Af Amer >60 >60 mL/min    Comment: (NOTE) The eGFR has been calculated using the CKD EPI equation. This calculation has not been validated in all clinical situations. eGFR's persistently <60 mL/min signify possible Chronic Kidney Disease.    Anion gap 5 5 - 15  Glucose, capillary     Status: None   Collection Time: 01/24/16  7:14 AM  Result Value Ref Range   Glucose-Capillary 96 65 - 99 mg/dL  Glucose, capillary     Status: Abnormal   Collection Time: 01/24/16 11:50 AM  Result Value Ref Range   Glucose-Capillary 226 (H) 65 - 99 mg/dL   Comment 1 Notify RN   Glucose, capillary     Status: Abnormal   Collection Time: 01/24/16  4:39 PM  Result Value Ref Range   Glucose-Capillary 205 (H) 65 - 99 mg/dL   Comment 1 Notify RN     Blood Alcohol level:  Lab Results  Component Value Date   ETH <5 12/21/2015   ETH <5 07/09/2015    Physical Findings: AIMS: Facial and Oral Movements Muscles of Facial Expression: None, normal Lips and Perioral Area: None, normal Jaw: None, normal Tongue: None, normal,Extremity Movements Upper (arms, wrists, hands, fingers): None, normal Lower (legs, knees, ankles, toes): None, normal, Trunk Movements Neck, shoulders, hips: None, normal, Overall Severity Severity of abnormal movements (highest score from questions above): None, normal Incapacitation due to abnormal movements: None,  normal Patient's awareness of abnormal movements (rate only patient's report): No Awareness, Dental Status Current problems with teeth and/or dentures?: No Does patient usually wear dentures?: No  CIWA:    COWS:     Musculoskeletal: Strength & Muscle Tone: decreased Gait & Station: unable to stand Patient leans: N/A  Psychiatric Specialty Exam: Review of Systems  Constitutional: Positive for malaise/fatigue.  Musculoskeletal: Positive for falls.  Neurological: Positive for weakness.  Psychiatric/Behavioral: Positive for depression and suicidal ideas.  All other systems reviewed and are negative.   Blood pressure 132/84, pulse 97, temperature 98.5 F (36.9 C), temperature source Oral, resp. rate 20, height 6' (1.829 m), weight 102.967 kg (227 lb), SpO2 100 %.Body mass index is 30.78 kg/(m^2).  General Appearance: Fairly Groomed  Engineer, water::  Fair  Speech:  Slow  Volume:  Decreased  Mood:  Hopeless  Affect:  Blunt  Thought Process:  Linear  Orientation:  Full (Time, Place, and Person)  Thought Content:  WDL  Suicidal Thoughts:  Yes.  with intent/plan  Homicidal Thoughts:  No  Memory:  Immediate;   Fair Recent;   Fair Remote;   Fair  Judgement:  Poor  Insight:  Shallow  Psychomotor Activity:  Psychomotor Retardation  Concentration:  Poor  Recall:  Poor  Fund of Knowledge:Poor  Language: Fair  Akathisia:  No  Handed:  Right  AIMS (if indicated):     Assets:  Communication Skills Desire for Improvement Financial Resources/Insurance Social Support  ADL's:  Intact  Cognition: WNL  Sleep:  Number of Hours: 6.45   Treatment Plan Summary: Daily contact with patient to assess and evaluate symptoms and progress in treatment and Medication management   Mr. Kamiya has a history of severe, treament resistant depression awaiting transfer to [facility].  1. Suicidal ideation. E is too despondent to attmept.  2. Mood. We continue remeron and Seroquel.  3. Diabetes. We continue  metformin, ADA diet, SSI nd blood glucose monitoring  4. H/O DVT. He is on Eloquis.  5. S/P fall  Surgery help with stiches is greatly appreciated.   6. 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 Resolved/stable restart lisinopril Lasix  7. Essential hypertension- continue metoprolol.  8. BPH. Continue finasteride and Flomax.  9. Weakness. Physical therapy evaluation appreciated.  10. Disposition. Transfer to [facility] when bed available.   Orson Slick, MD 01/24/2016, 6:49 PM

## 2016-01-24 NOTE — [facility] Group Notes (Signed)
[facility] LCSW Group Therapy  01/24/2016 4:12 PM  Type of Therapy:  Group Therapy  Participation Level:  Did Not Attend  Modes of Intervention:  Discussion, Education, Socialization and Support  Summary of Progress/Problems: Pt will identify unhealthy thoughts and how they impact their emotions and behavior. Pt will be encouraged to discuss these thoughts, emotions and behaviors with the group.   Christian [name] Christian [name] MSW, LCSWA  01/24/2016, 4:12 PM   

## 2016-01-24 NOTE — Progress Notes (Signed)
D: Pt denies pain. Denies SI/HI/AVH. Forwards little. Agrees to call for help when needing assistance to bathroom. Alert and oriented X 3. Disoriented to situation. A: Encouragement and support provided. Medications given as prescribed. Q15 minute checks maintained for safety. Frequent rounding. Bed alarm in place. Education to call for help when needing to ambulate. Pt verbalizes understanding. R: Remains safe on unit. Cooperative and appropriate with staff. Minimal interaction. Will continue to monitor.

## 2016-01-24 NOTE — Progress Notes (Signed)
Patient ID: Christian [name], male   DOB: 21-Sep-1951, 65 y.o.   MRN: [id number] [facility] PROGRESS NOTE  Christian [name] NFA:[id number] DOB: 1951/10/13 DOA: 12/22/2015 PCP: Christian Rubenstein, MD  HPI/Subjective: Complains of pain right eyebrow right knee after fall  Objective: Filed Vitals:   01/24/16 0658 01/24/16 1209  BP: 115/74 127/93  Pulse: 84 81  Temp: 98.3 F (36.8 C)   Resp: 18 18    Filed Weights   01/09/16 0827 01/12/16 1103 01/16/16 0836  Weight: 102.967 kg (227 lb) 104.327 kg (230 lb) 102.967 kg (227 lb)    ROS: Review of Systems  Constitutional: Negative for fever and chills.  Eyes: Negative for blurred vision.  Respiratory: Negative for cough and shortness of breath.   Cardiovascular: Negative for chest pain.  Gastrointestinal: Negative for nausea, vomiting, abdominal pain, diarrhea and constipation.  Genitourinary: Negative for dysuria.  Musculoskeletal: Negative for joint pain.  Neurological: Positive for tremors. Negative for dizziness and headaches.   Exam: Physical Exam  HENT:  Nose: No mucosal edema.  Mouth/Throat: No oropharyngeal exudate or posterior oropharyngeal edema.  Eyes: Conjunctivae, EOM and lids are normal. Pupils are equal, round, and reactive to light.  Neck: No JVD present. Carotid bruit is not present. No edema present. No thyroid mass and no thyromegaly present.  Cardiovascular: S1 normal and S2 normal.  Exam reveals no gallop.   No murmur heard. Pulses:      Dorsalis pedis pulses are 2+ on the right side, and 2+ on the left side.  Respiratory: No respiratory distress. He has no wheezes. He has no rhonchi. He has no rales.  GI: Soft. Bowel sounds are normal. There is no tenderness.  Musculoskeletal:       Right ankle: He exhibits swelling.       Left ankle: He exhibits swelling.  Lymphadenopathy:    He has no cervical adenopathy.  Neurological: He is alert. No cranial nerve deficit.  Positive tremor bilateral upper  extremities  Skin: Skin is warm. No rash noted. Nails show no clubbing.  Psychiatric: He has a normal mood and affect.      Data Reviewed: Basic Metabolic Panel:  Recent Labs Lab 01/18/16 0618 01/19/16 0657 01/20/16 0627 01/22/16 0655 01/24/16 0656  NA 135 132* 135  --  132*  K 5.0 4.6 4.4  --  5.1  CL 99* 98* 100*  --  102  CO2 --  25  GLUCOSE 92 92 110*  --  109*  BUN 62* 57* 45*  --  23*  CREATININE 2.37* 1.87* 1.45* 1.28* 1.14  CALCIUM 9.6 9.0 9.3  --  8.9   Liver Function Tests:  Recent Labs Lab 01/18/16 0618  AST 11*  ALT 11*  ALKPHOS 77  BILITOT 0.5  PROT 6.6  ALBUMIN 3.7   CBC:  Recent Labs Lab 01/19/16 0657 01/22/16 0655  WBC 4.5 4.5  HGB 12.4* 13.6  HCT 37.3* 40.3  MCV 87.1 86.9  PLT 139* 184    CBG:  Recent Labs Lab 01/23/16 0606 01/23/16 1156 01/23/16 1643 01/23/16 2038 01/24/16 0714  GLUCAP 160* 218* 165* 176* 96    Scheduled Meds: . allopurinol  100 mg Oral Daily  . apixaban  5 mg Oral BID  . aspirin EC  81 mg Oral Daily  . atorvastatin  10 mg Oral q1800  . clonazePAM  0.5 mg Oral TID  . docusate sodium  100 mg Oral BID  . finasteride  5 mg Oral Daily  . furosemide  20 mg Oral Daily  . insulin aspart  0-9 Units Subcutaneous TID WC  . lisinopril  40 mg Oral Daily  . metFORMIN  500 mg Oral BID WC  . metoprolol tartrate  12.5 mg Oral BID  . mirtazapine  45 mg Oral QHS  . multivitamin with minerals  1 tablet Oral Daily  . pantoprazole  40 mg Oral Daily  . polyethylene glycol  17 g Oral Daily  . QUEtiapine  400 mg Oral QHS  . tamsulosin  0.4 mg Oral QPC supper    Assessment/Plan: 1. mechanical fall: No further falls surgery addressing sutures 2. 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 Resolved/stable restart lisinopril Lasix 1. Tremor upper extremities. Seen by neuro, can consider cogentin if bothersome to patient 2. Essential hypertension- continue metoprolol. 3. Type 2 diabetes mellitus.  Restarted Glucophage. 4. History of DVT on eliquis 5. BPH continue finasteride and Flomax. 6. Weakness- physical therapy evaluation appreciated    No further medical need to follow this point we will sign off please call for further questions  Code Status:     Code Status Orders        Start     Ordered   12/22/15 1338  Full code   Continuous     12/22/15 1337    Code Status History    Date Active Date Inactive Code Status Order ID Comments User Context   12/22/2015  8:30 AM 12/22/2015  1:37 PM Full Code [id number]  Christian Amel, MD ED     Disposition: As per psychiatry  Consultants:  Neurology  Time spent:20 minutes   [name],  Christian [name]  [facility]

## 2016-01-25 LAB — CBC
HCT: 37.4 % — ABNORMAL LOW (ref 40.0–52.0)
Hemoglobin: 12.5 g/dL — ABNORMAL LOW (ref 13.0–18.0)
MCH: 29.6 pg (ref 26.0–34.0)
MCHC: 33.5 g/dL (ref 32.0–36.0)
MCV: 88.3 fL (ref 80.0–100.0)
Platelets: 167 10*3/uL (ref 150–440)
RBC: 4.24 MIL/uL — ABNORMAL LOW (ref 4.40–5.90)
RDW: 13.4 % (ref 11.5–14.5)
WBC: 4.8 10*3/uL (ref 3.8–10.6)

## 2016-01-25 LAB — GLUCOSE, CAPILLARY
Glucose-Capillary: 98 mg/dL (ref 65–99)
Glucose-Capillary: 238 mg/dL — ABNORMAL HIGH (ref 65–99)
Glucose-Capillary: 102 mg/dL — ABNORMAL HIGH (ref 65–99)
Glucose-Capillary: 145 mg/dL — ABNORMAL HIGH (ref 65–99)

## 2016-01-25 LAB — CREATININE, SERUM
Creatinine, Ser: 1.17 mg/dL (ref 0.61–1.24)
GFR calc Af Amer: >60 mL/min (ref >60)
GFR calc non Af Amer: >60 mL/min (ref >60)

## 2016-01-25 NOTE — [facility] Group Notes (Signed)
[facility] Group Notes:  (Nursing/MHT/Case Management/Adjunct)  Date:  01/25/2016  Time:  11:49 AM  Type of Therapy:  Psychoeducational Skills  Participation Level:  Did Not Attend   Lynelle Smoke [facility] 01/25/2016, 11:49 AM

## 2016-01-25 NOTE — [facility] Group Notes (Signed)
[facility] LCSW Group Therapy  01/25/2016 3:44 PM  Type of Therapy:  Group Therapy  Participation Level:  Did Not Attend   Modes of Intervention:  Discussion, Education, Socialization and Support  Summary of Progress/Problems: Pts were asked to identify what balance means to them. They were encouraged to identify what throws them off balance and how to regain balance.    Micky Sheller L Donabelle Molden MSW, LCSWA  01/25/2016, 3:44 PM  

## 2016-01-25 NOTE — Progress Notes (Signed)
During shift change Pts Right eye noted to be bleeding and trickling down into his eye. Doctor and oncoming shift notified. Area cleaned but is still oozing.

## 2016-01-25 NOTE — Progress Notes (Signed)
Pt has been pleasant and cooperative. Pt has been seclusive to his room. No complaints voiced at this time.Pt denies SI and A/V hallucinations.

## 2016-01-25 NOTE — Progress Notes (Signed)
[facility] MD Progress Note  01/25/2016 2:19 PM Christian [name]  MRN:  [id number]  Subjective: [name] has no complaints today. He is still depressed and despondent but gets up for meals. His appetite is fair. He tolerates medications well. He started bleeding from his cut on the hand this morning. Dressing was placed. Surgery involvement is greatly appreciated..   Principal Problem: Bipolar disorder, current episode depressed, severe, with psychotic features ([place]) Diagnosis:   Patient Active Problem List   Diagnosis Date Noted  . Fall [W19.XXXA]   . Laceration of eyebrow [S01.119A]   . Bipolar I disorder, most recent episode depressed, severe w psychosis ([place]) [F31.5] 12/22/2015  . Bipolar affective disorder, depressed, severe, with psychotic behavior ([place]) [F31.5]   . Bipolar I disorder, current or most recent episode depressed, with psychotic features ([place]) [F31.5]   . Bipolar I disorder, most recent episode depressed ([place]) [F31.30]   . Severe bipolar I disorder with depression ([place]) [F31.4]   . Bipolar I disorder, most recent episode depressed, severe with psychotic features ([place]) [F31.5]   . Overdose [T50.901A] 09/11/2015  . Acute respiratory failure with hypoxia ([place]) [J96.01] 09/05/2015  . Elevated troponin [R79.89] 09/05/2015  . Somnolence [R40.0] 09/05/2015  . Bipolar depression ([place]) [F31.30] 09/05/2015  . Acute bilateral deep vein thrombosis (DVT) of femoral veins (HCC) [I82.413] 09/05/2015  . Bilateral pulmonary embolism ([place]) [I26.99] 09/02/2015  . Labile hypertension [I10]   . Pulmonary embolism with acute cor pulmonale ([place]) [I26.09]   . Dyspnea [R06.00]   . Orthostatic hypotension [I95.1]   . Bipolar disorder, in partial remission, most recent episode depressed ([place]) [F31.75]   . Syncope, near [R55] 08/31/2015  . Affective psychosis, bipolar ([place]) [F31.9]   . Bipolar disorder, now depressed ([place]) [F31.30] 08/28/2015  . Bipolar I disorder, severe, current or most recent  episode depressed, with psychotic features ([place]) [F31.5]   . Pressure ulcer [L89.90] 07/30/2015  . Protein-calorie malnutrition, severe ([place]) [E43] 07/30/2015  . Dehydration [E86.0] 07/29/2015  . Bipolar disorder, current episode depressed, severe, with psychotic features ([place]) [F31.5] 07/09/2015  . Diabetes ([place]) [E11.9] 07/09/2015  . UTI (urinary tract infection) [N39.0] 06/19/2014  . Positive blood culture [R78.81] 06/19/2014  . Bipolar affective disorder, current episode manic with psychotic symptoms ([place]) [F31.2] 06/17/2014  . Type II or unspecified type diabetes mellitus with unspecified complication, uncontrolled [E11.8, E11.65] 06/02/2014  . Other and unspecified hyperlipidemia [E78.5] 06/02/2014  . OSA on CPAP [G47.33] 06/02/2014  . 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 Gout [M10.9] 06/02/2014  . Hypertension [I10]    Total Time spent with patient: 20 minutes  Past Psychiatric History: Depression.   Past Medical History:  Past Medical History  Diagnosis Date  . Mood swings ([place])   . Type II or unspecified type diabetes mellitus with unspecified complication, uncontrolled 06/02/2014  . Other and unspecified hyperlipidemia 06/02/2014  . OSA on CPAP 06/02/2014  . 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 Bipolar disorder ([employer])   . Hypertension   . Gout   . Bipolar 1 disorder ([place])   . Hx of blood clots   . Chronic kidney disease    History reviewed. No pertinent past surgical history. Family History:  Family History  Problem Relation Age of Onset  . Dementia Mother   . Arthritis Father   . Hypertension Father   . Heart failure Neg Hx   . Kidney failure Neg Hx   .  Cancer Neg Hx   . Mental illness Sister   . Diabetes Sister    Family Psychiatric  History: Bipolar disorder.al History:  History  Alcohol Use No     History  Drug Use No    Social History   Social History  .  Marital Status: Married    Spouse Name: N/A  . Number of Children: N/A  . Years of Education: N/A   Social History Main Topics  . Smoking status: Never Smoker   . Smokeless tobacco: None  . Alcohol Use: No  . Drug Use: No  . Sexual Activity: Not Currently   Other Topics Concern  . None   Social History Narrative   *The patient has been married for over 30 years and is currently on disability. He lives with his wife in the [place] area and has one daughter.             Additional Social History:                         Sleep: Fair  Appetite:  Fair  Current Medications: Current Facility-Administered Medications  Medication Dose Route Frequency Provider Last Rate Last Dose  . acetaminophen (TYLENOL) tablet 650 mg  650 mg Oral Q6H PRN Gonzella Lex, MD   650 mg at 01/23/16 2212  . allopurinol (ZYLOPRIM) tablet 100 mg  100 mg Oral Daily Loletha Grayer, MD   100 mg at 01/25/16 0857  . alum & mag hydroxide-simeth (MAALOX/MYLANTA) 200-200-20 MG/5ML suspension 30 mL  30 mL Oral Q4H PRN Gonzella Lex, MD      . apixaban (ELIQUIS) tablet 5 mg  5 mg Oral BID Gonzella Lex, MD   5 mg at 01/25/16 0856  . aspirin EC tablet 81 mg  81 mg Oral Daily Gonzella Lex, MD   81 mg at 01/25/16 0857  . atorvastatin (LIPITOR) tablet 10 mg  10 mg Oral q1800 Gonzella Lex, MD   10 mg at 01/24/16 1645  . clonazePAM (KLONOPIN) tablet 0.5 mg  0.5 mg Oral TID Gonzella Lex, MD   0.5 mg at 01/25/16 0853  . dextrose 5 % solution   Intravenous Continuous Gonzella Lex, MD 10 mL/hr at 01/12/16 1130    . docusate sodium (COLACE) capsule 100 mg  100 mg Oral BID Gonzella Lex, MD   100 mg at 01/25/16 0854  . finasteride (PROSCAR) tablet 5 mg  5 mg Oral Daily Gonzella Lex, MD   5 mg at 01/25/16 0853  . furosemide (LASIX) tablet 20 mg  20 mg Oral Daily Lytle Butte, MD   20 mg at 01/25/16 0857  . insulin aspart (novoLOG) injection 0-9 Units  0-9 Units Subcutaneous TID WC Chauncey Mann, MD   3  Units at 01/25/16 1201  . lisinopril (PRINIVIL,ZESTRIL) tablet 40 mg  40 mg Oral Daily Lytle Butte, MD   40 mg at 01/25/16 0854  . magnesium hydroxide (MILK OF MAGNESIA) suspension 30 mL  30 mL Oral Daily PRN Gonzella Lex, MD   30 mL at 01/08/16 2315  . metFORMIN (GLUCOPHAGE-XR) 24 hr tablet 500 mg  500 mg Oral BID WC Loletha Grayer, MD   500 mg at 01/25/16 0855  . metoprolol tartrate (LOPRESSOR) tablet 12.5 mg  12.5 mg Oral BID Loletha Grayer, MD   12.5 mg at 01/25/16 0855  . mirtazapine (REMERON) tablet 45 mg  45 mg Oral QHS  Gonzella Lex, MD   45 mg at 01/24/16 2127  . multivitamin with minerals tablet 1 tablet  1 tablet Oral Daily Hildred Priest, MD   1 tablet at 01/25/16 [phone]  . ondansetron (ZOFRAN-ODT) disintegrating tablet 4 mg  4 mg Oral Q8H PRN Gonzella Lex, MD   4 mg at 01/09/16 1340  . ondansetron (ZOFRAN-ODT) disintegrating tablet 4 mg  4 mg Oral Q8H PRN Gonzella Lex, MD   4 mg at 01/16/16 1739  . pantoprazole (PROTONIX) EC tablet 40 mg  40 mg Oral Daily Gonzella Lex, MD   40 mg at 01/25/16 0854  . polyethylene glycol (MIRALAX / GLYCOLAX) packet 17 g  17 g Oral Daily Gonzella Lex, MD   17 g at 01/25/16 3825  . QUEtiapine (SEROQUEL) tablet 400 mg  400 mg Oral QHS Gonzella Lex, MD   400 mg at 01/24/16 2127  . tamsulosin (FLOMAX) capsule 0.4 mg  0.4 mg Oral QPC supper Gonzella Lex, MD   0.4 mg at 01/24/16 1646    Lab Results:  Results for orders placed or performed during the hospital encounter of 12/22/15 (from the past 48 hour(s))  Glucose, capillary     Status: Abnormal   Collection Time: 01/23/16  4:43 PM  Result Value Ref Range   Glucose-Capillary 165 (H) 65 - 99 mg/dL  Glucose, capillary     Status: Abnormal   Collection Time: 01/23/16  8:38 PM  Result Value Ref Range   Glucose-Capillary 176 (H) 65 - 99 mg/dL  Basic metabolic panel     Status: Abnormal   Collection Time: 01/24/16  6:56 AM  Result Value Ref Range   Sodium 132 (L) 135 - 145  mmol/L   Potassium 5.1 3.5 - 5.1 mmol/L   Chloride 102 101 - 111 mmol/L   CO2 25 22 - 32 mmol/L   Glucose, Bld 109 (H) 65 - 99 mg/dL   BUN 23 (H) 6 - 20 mg/dL   Creatinine, Ser 1.14 0.61 - 1.24 mg/dL   Calcium 8.9 8.9 - 10.3 mg/dL   GFR calc non Af Amer >60 >60 mL/min   GFR calc Af Amer >60 >60 mL/min    Comment: (NOTE) The eGFR has been calculated using the CKD EPI equation. This calculation has not been validated in all clinical situations. eGFR's persistently <60 mL/min signify possible Chronic Kidney Disease.    Anion gap 5 5 - 15  Glucose, capillary     Status: None   Collection Time: 01/24/16  7:14 AM  Result Value Ref Range   Glucose-Capillary 96 65 - 99 mg/dL  Glucose, capillary     Status: Abnormal   Collection Time: 01/24/16 11:50 AM  Result Value Ref Range   Glucose-Capillary 226 (H) 65 - 99 mg/dL   Comment 1 Notify RN   Glucose, capillary     Status: Abnormal   Collection Time: 01/24/16  4:39 PM  Result Value Ref Range   Glucose-Capillary 205 (H) 65 - 99 mg/dL   Comment 1 Notify RN   Glucose, capillary     Status: Abnormal   Collection Time: 01/24/16  8:37 PM  Result Value Ref Range   Glucose-Capillary 135 (H) 65 - 99 mg/dL   Comment 1 Notify RN   Creatinine, serum     Status: None   Collection Time: 01/25/16  6:10 AM  Result Value Ref Range   Creatinine, Ser 1.17 0.61 - 1.24 mg/dL   GFR calc non  Af Amer >60 >60 mL/min   GFR calc Af Amer >60 >60 mL/min    Comment: (NOTE) The eGFR has been calculated using the CKD EPI equation. This calculation has not been validated in all clinical situations. eGFR's persistently <60 mL/min signify possible Chronic Kidney Disease.   CBC     Status: Abnormal   Collection Time: 01/25/16  6:10 AM  Result Value Ref Range   WBC 4.8 3.8 - 10.6 K/uL   RBC 4.24 (L) 4.40 - 5.90 MIL/uL   Hemoglobin 12.5 (L) 13.0 - 18.0 g/dL   HCT 37.4 (L) 40.0 - 52.0 %   MCV 88.3 80.0 - 100.0 fL   MCH 29.6 26.0 - 34.0 pg   MCHC 33.5 32.0 -  36.0 g/dL   RDW 13.4 11.5 - 14.5 %   Platelets 167 150 - 440 K/uL  Glucose, capillary     Status: None   Collection Time: 01/25/16  7:01 AM  Result Value Ref Range   Glucose-Capillary 98 65 - 99 mg/dL  Glucose, capillary     Status: Abnormal   Collection Time: 01/25/16 11:55 AM  Result Value Ref Range   Glucose-Capillary 238 (H) 65 - 99 mg/dL    Blood Alcohol level:  Lab Results  Component Value Date   ETH <5 12/21/2015   ETH <5 07/09/2015    Physical Findings: AIMS: Facial and Oral Movements Muscles of Facial Expression: None, normal Lips and Perioral Area: None, normal Jaw: None, normal Tongue: None, normal,Extremity Movements Upper (arms, wrists, hands, fingers): None, normal Lower (legs, knees, ankles, toes): None, normal, Trunk Movements Neck, shoulders, hips: None, normal, Overall Severity Severity of abnormal movements (highest score from questions above): None, normal Incapacitation due to abnormal movements: None, normal Patient's awareness of abnormal movements (rate only patient's report): No Awareness, Dental Status Current problems with teeth and/or dentures?: No Does patient usually wear dentures?: No  CIWA:    COWS:     Musculoskeletal: Strength & Muscle Tone: decreased Gait & Station: unsteady Patient leans: N/A  Psychiatric Specialty Exam: Review of Systems  Musculoskeletal: Positive for falls.  Psychiatric/Behavioral: Positive for depression and suicidal ideas.  All other systems reviewed and are negative.   Blood pressure 128/77, pulse 87, temperature 98.5 F (36.9 C), temperature source Oral, resp. rate 18, height 6' (1.829 m), weight 102.967 kg (227 lb), SpO2 100 %.Body mass index is 30.78 kg/(m^2).  General Appearance: Casual  Eye Contact::  Fair  Speech:  Slurred  Volume:  Decreased  Mood:  Depressed  Affect:  Blunt  Thought Process:  Goal Directed  Orientation:  Full (Time, Place, and Person)  Thought Content:  WDL  Suicidal Thoughts:   Yes.  with intent/plan  Homicidal Thoughts:  No  Memory:  Immediate;   Fair Recent;   Fair Remote;   Fair  Judgement:  Impaired  Insight:  Shallow  Psychomotor Activity:  Decreased  Concentration:  Fair  Recall:  [place]: Fair  Akathisia:  No  Handed:  Right  AIMS (if indicated):     Assets:  Communication Skills Desire for Improvement Financial Resources/Insurance Resilience Social Support  ADL's:  Intact  Cognition: WNL  Sleep:  Number of Hours: 8.15   Treatment Plan Summary: Daily contact with patient to assess and evaluate symptoms and progress in treatment and Medication management   Mr. Coral has a history of severe, treament resistant depression awaiting transfer to [facility].  1. Suicidal ideation. E is too despondent to  attmept.  2. Mood. We continue remeron and Seroquel.  3. Diabetes. We continue metformin, ADA diet, SSI nd blood glucose monitoring  4. H/O DVT. He is on Eloquis.  5. S/P fall Surgery help with stiches is greatly appreciated.   6. 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 Resolved/stable restart lisinopril Lasix  7. Essential hypertension- continue metoprolol.  8. BPH. Continue finasteride and Flomax.  9. Weakness. Physical therapy evaluation appreciated.  10. Disposition. Transfer to [facility] when bed available.   Orson Slick, MD 01/25/2016, 2:19 PM

## 2016-01-26 LAB — GLUCOSE, CAPILLARY
Glucose-Capillary: 103 mg/dL — ABNORMAL HIGH (ref 65–99)
Glucose-Capillary: 212 mg/dL — ABNORMAL HIGH (ref 65–99)
Glucose-Capillary: 133 mg/dL — ABNORMAL HIGH (ref 65–99)
Glucose-Capillary: 210 mg/dL — ABNORMAL HIGH (ref 65–99)

## 2016-01-26 NOTE — [facility] Group Notes (Signed)
[facility] Group Notes:  (Nursing/MHT/Case Management/Adjunct)  Date:  01/26/2016  Time:  12:21 PM  Type of Therapy:  Psychoeducational Skills  Participation Level:  Did Not Attend    Mickey Farber 01/26/2016, 12:21 PM

## 2016-01-26 NOTE — Progress Notes (Signed)
Patient pleasant and cooperative during shift. Patient medication compliant. Patient compliant with fall precautions. No concerns voiced during shift. Q 15 min checks maintained for safety.

## 2016-01-26 NOTE — [facility] Group Notes (Signed)
[facility] LCSW Group Therapy  01/26/2016 2:10 PM  Type of Therapy:  Group Therapy  Participation Level:  Did Not Attend  Summary of Progress/Problems: Patient was called to group but did not attend.  Lulu Riding, MSW, LCSWA 01/26/2016, 2:10 PM

## 2016-01-26 NOTE — Plan of Care (Signed)
Problem: Alteration in mood Goal: LTG-Pt's behavior demonstrates decreased signs of depression (Patient's behavior demonstrates decreased signs of depression to the point the patient is safe to return home and continue treatment in an outpatient setting)  Outcome: Not Progressing Patient presents with sad flat affect.   Rates depression as a 8/10.  Stays in his room in bed sleeping.  No performing his ADL's.  Body odor noteed.

## 2016-01-26 NOTE — Progress Notes (Signed)
D:  Depressed affect.  Rates depression as 8/10.  Stays to himself in his room in bed.  Sleeping the majority of day.  Up to side of bed for meals. Dressing noted above right eye.  No ADL's performed. A:  Support and encouragement offered.  Bandage changed to  Right eye.  Bed alarm in place for safety.  q 15 minutes checks maintained.  R:  Medication compliant.  Complaint with calling for assistance when need to get out of bed.

## 2016-01-26 NOTE — Progress Notes (Signed)
[facility] MD Progress Note  01/26/2016 6:25 PM Christian [name]  MRN:  [id number]  Subjective: Follow-up Monday the 27th. Patient has no change to his status. Eats adequately barely gets up otherwise. Vital signs stable. His eyes quite swollen today from the fall that he had but there is no sign of infection and he is not complaining of pain. Patient is still worried about his ultimate discharge plan.   Principal Problem: Bipolar disorder, current episode depressed, severe, with psychotic features (HCC) Diagnosis:   Patient Active Problem List   Diagnosis Date Noted  . Fall [W19.XXXA]   . Laceration of eyebrow [S01.119A]   . Bipolar I disorder, most recent episode depressed, severe w psychosis (HCC) [F31.5] 12/22/2015  . Bipolar affective disorder, depressed, severe, with psychotic behavior (HCC) [F31.5]   . Bipolar I disorder, current or most recent episode depressed, with psychotic features (HCC) [F31.5]   . Bipolar I disorder, most recent episode depressed (HCC) [F31.30]   . Severe bipolar I disorder with depression (HCC) [F31.4]   . Bipolar I disorder, most recent episode depressed, severe with psychotic features (HCC) [F31.5]   . Overdose [T50.901A] 09/11/2015  . Acute respiratory failure with hypoxia (HCC) [J96.01] 09/05/2015  . Elevated troponin [R79.89] 09/05/2015  . Somnolence [R40.0] 09/05/2015  . Bipolar depression (HCC) [F31.30] 09/05/2015  . Acute bilateral deep vein thrombosis (DVT) of femoral veins (HCC) [I82.413] 09/05/2015  . Bilateral pulmonary embolism (HCC) [I26.99] 09/02/2015  . Labile hypertension [I10]   . Pulmonary embolism with acute cor pulmonale (HCC) [I26.09]   . Dyspnea [R06.00]   . Orthostatic hypotension [I95.1]   . Bipolar disorder, in partial remission, most recent episode depressed (HCC) [F31.75]   . Syncope, near [R55] 08/31/2015  . Affective psychosis, bipolar (HCC) [F31.9]   . Bipolar disorder, now depressed (HCC) [F31.30] 08/28/2015  . Bipolar I  disorder, severe, current or most recent episode depressed, with psychotic features (HCC) [F31.5]   . Pressure ulcer [L89.90] 07/30/2015  . Protein-calorie malnutrition, severe (HCC) [E43] 07/30/2015  . Dehydration [E86.0] 07/29/2015  . Bipolar disorder, current episode depressed, severe, with psychotic features (HCC) [F31.5] 07/09/2015  . Diabetes (HCC) [E11.9] 07/09/2015  . UTI (urinary tract infection) [N39.0] 06/19/2014  . Positive blood culture [R78.81] 06/19/2014  . Bipolar affective disorder, current episode manic with psychotic symptoms (HCC) [F31.2] 06/17/2014  . Type II or unspecified type diabetes mellitus with unspecified complication, uncontrolled [E11.8, E11.65] 06/02/2014  . Other and unspecified hyperlipidemia [E78.5] 06/02/2014  . OSA on CPAP [G47.33] 06/02/2014  . 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 Gout [M10.9] 06/02/2014  . Hypertension [I10]    Total Time spent with patient: 20 minutes  Past Psychiatric History: Depression.   Past Medical History:  Past Medical History  Diagnosis Date  . Mood swings (HCC)   . Type II or unspecified type diabetes mellitus with unspecified complication, uncontrolled 06/02/2014  . Other and unspecified hyperlipidemia 06/02/2014  . OSA on CPAP 06/02/2014  . 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 Bipolar disorder (HCC)   . Hypertension   . Gout   . Bipolar 1 disorder (HCC)   . Hx of blood clots   . Chronic kidney disease    History reviewed. No pertinent past surgical history. Family History:  Family History  Problem Relation Age of Onset  . Dementia Mother   . Arthritis Father   . Hypertension Father   . Heart failure Neg Hx   .   Kidney failure Neg Hx   . Cancer Neg Hx   . Mental illness Sister   . Diabetes Sister    Family Psychiatric  History: Bipolar disorder.al History:  History  Alcohol Use No     History  Drug Use No     Social History   Social History  . Marital Status: Married    Spouse Name: N/A  . Number of Children: N/A  . Years of Education: N/A   Social History Main Topics  . Smoking status: Never Smoker   . Smokeless tobacco: None  . Alcohol Use: No  . Drug Use: No  . Sexual Activity: Not Currently   Other Topics Concern  . None   Social History Narrative   *The patient has been married for over 30 years and is currently on disability. He lives with his wife in the [place] area and has one daughter.             Additional Social History:                         Sleep: Fair  Appetite:  Fair  Current Medications: Current Facility-Administered Medications  Medication Dose Route Frequency Provider Last Rate Last Dose  . acetaminophen (TYLENOL) tablet 650 mg  650 mg Oral Q6H PRN Gonzella Lex, MD   650 mg at 01/23/16 2212  . allopurinol (ZYLOPRIM) tablet 100 mg  100 mg Oral Daily Loletha Grayer, MD   100 mg at 01/26/16 0943  . alum & mag hydroxide-simeth (MAALOX/MYLANTA) 200-200-20 MG/5ML suspension 30 mL  30 mL Oral Q4H PRN Gonzella Lex, MD      . apixaban (ELIQUIS) tablet 5 mg  5 mg Oral BID Gonzella Lex, MD   5 mg at 01/26/16 0944  . aspirin EC tablet 81 mg  81 mg Oral Daily Gonzella Lex, MD   81 mg at 01/26/16 0943  . atorvastatin (LIPITOR) tablet 10 mg  10 mg Oral q1800 Gonzella Lex, MD   10 mg at 01/26/16 1741  . clonazePAM (KLONOPIN) tablet 0.5 mg  0.5 mg Oral TID Gonzella Lex, MD   0.5 mg at 01/26/16 1741  . dextrose 5 % solution   Intravenous Continuous Gonzella Lex, MD 10 mL/hr at 01/12/16 1130    . docusate sodium (COLACE) capsule 100 mg  100 mg Oral BID Gonzella Lex, MD   100 mg at 01/26/16 0943  . finasteride (PROSCAR) tablet 5 mg  5 mg Oral Daily Gonzella Lex, MD   5 mg at 01/26/16 0943  . furosemide (LASIX) tablet 20 mg  20 mg Oral Daily Lytle Butte, MD   20 mg at 01/26/16 0943  . insulin aspart (novoLOG) injection 0-9 Units  0-9 Units  Subcutaneous TID WC Chauncey Mann, MD   1 Units at 01/26/16 1742  . lisinopril (PRINIVIL,ZESTRIL) tablet 40 mg  40 mg Oral Daily Lytle Butte, MD   40 mg at 01/26/16 0943  . magnesium hydroxide (MILK OF MAGNESIA) suspension 30 mL  30 mL Oral Daily PRN Gonzella Lex, MD   30 mL at 01/08/16 2315  . metFORMIN (GLUCOPHAGE-XR) 24 hr tablet 500 mg  500 mg Oral BID WC Loletha Grayer, MD   500 mg at 01/26/16 1742  . metoprolol tartrate (LOPRESSOR) tablet 12.5 mg  12.5 mg Oral BID Loletha Grayer, MD   12.5 mg at 01/26/16 0944  . mirtazapine (REMERON) tablet  45 mg  45 mg Oral QHS  T , MD   45 mg at 01/25/16 2212  . multivitamin with minerals tablet 1 tablet  1 tablet Oral Daily Andrea Hernandez-Gonzalez, MD   1 tablet at 01/26/16 0943  . ondansetron (ZOFRAN-ODT) disintegrating tablet 4 mg  4 mg Oral Q8H PRN  T , MD   4 mg at 01/09/16 1340  . ondansetron (ZOFRAN-ODT) disintegrating tablet 4 mg  4 mg Oral Q8H PRN  T , MD   4 mg at 01/16/16 1739  . pantoprazole (PROTONIX) EC tablet 40 mg  40 mg Oral Daily  T , MD   40 mg at 01/26/16 0943  . polyethylene glycol (MIRALAX / GLYCOLAX) packet 17 g  17 g Oral Daily  T , MD   17 g at 01/26/16 0944  . QUEtiapine (SEROQUEL) tablet 400 mg  400 mg Oral QHS  T , MD   400 mg at 01/25/16 2212  . tamsulosin (FLOMAX) capsule 0.4 mg  0.4 mg Oral QPC supper  T , MD   0.4 mg at 01/26/16 1741    Lab Results:  Results for orders placed or performed during the hospital encounter of 12/22/15 (from the past 48 hour(s))  Glucose, capillary     Status: Abnormal   Collection Time: 01/24/16  8:37 PM  Result Value Ref Range   Glucose-Capillary 135 (H) 65 - 99 mg/dL   Comment 1 Notify RN   Creatinine, serum     Status: None   Collection Time: 01/25/16  6:10 AM  Result Value Ref Range   Creatinine, Ser 1.17 0.61 - 1.24 mg/dL   GFR calc non Af Amer >60 >60 mL/min   GFR calc Af Amer >60 >60 mL/min     Comment: (NOTE) The eGFR has been calculated using the CKD EPI equation. This calculation has not been validated in all clinical situations. eGFR's persistently <60 mL/min signify possible Chronic Kidney Disease.   CBC     Status: Abnormal   Collection Time: 01/25/16  6:10 AM  Result Value Ref Range   WBC 4.8 3.8 - 10.6 K/uL   RBC 4.24 (L) 4.40 - 5.90 MIL/uL   Hemoglobin 12.5 (L) 13.0 - 18.0 g/dL   HCT 37.4 (L) 40.0 - 52.0 %   MCV 88.3 80.0 - 100.0 fL   MCH 29.6 26.0 - 34.0 pg   MCHC 33.5 32.0 - 36.0 g/dL   RDW 13.4 11.5 - 14.5 %   Platelets 167 150 - 440 K/uL  Glucose, capillary     Status: None   Collection Time: 01/25/16  7:01 AM  Result Value Ref Range   Glucose-Capillary 98 65 - 99 mg/dL  Glucose, capillary     Status: Abnormal   Collection Time: 01/25/16 11:55 AM  Result Value Ref Range   Glucose-Capillary 238 (H) 65 - 99 mg/dL  Glucose, capillary     Status: Abnormal   Collection Time: 01/25/16  4:31 PM  Result Value Ref Range   Glucose-Capillary 102 (H) 65 - 99 mg/dL   Comment 1 Notify RN   Glucose, capillary     Status: Abnormal   Collection Time: 01/25/16  8:24 PM  Result Value Ref Range   Glucose-Capillary 145 (H) 65 - 99 mg/dL  Glucose, capillary     Status: Abnormal   Collection Time: 01/26/16  6:57 AM  Result Value Ref Range   Glucose-Capillary 103 (H) 65 - 99 mg/dL  Glucose, capillary     Status: Abnormal     Collection Time: 01/26/16 12:12 PM  Result Value Ref Range   Glucose-Capillary 212 (H) 65 - 99 mg/dL  Glucose, capillary     Status: Abnormal   Collection Time: 01/26/16  4:44 PM  Result Value Ref Range   Glucose-Capillary 133 (H) 65 - 99 mg/dL   Comment 1 Notify RN     Blood Alcohol level:  Lab Results  Component Value Date   ETH <5 12/21/2015   ETH <5 07/09/2015    Physical Findings: AIMS: Facial and Oral Movements Muscles of Facial Expression: None, normal Lips and Perioral Area: None, normal Jaw: None, normal Tongue: None,  normal,Extremity Movements Upper (arms, wrists, hands, fingers): None, normal Lower (legs, knees, ankles, toes): None, normal, Trunk Movements Neck, shoulders, hips: None, normal, Overall Severity Severity of abnormal movements (highest score from questions above): None, normal Incapacitation due to abnormal movements: None, normal Patient's awareness of abnormal movements (rate only patient's report): No Awareness, Dental Status Current problems with teeth and/or dentures?: No Does patient usually wear dentures?: No  CIWA:    COWS:     Musculoskeletal: Strength & Muscle Tone: decreased Gait & Station: unsteady Patient leans: N/A  Psychiatric Specialty Exam: Review of Systems  Musculoskeletal: Positive for falls.  Psychiatric/Behavioral: Positive for depression and suicidal ideas.  All other systems reviewed and are negative.   Blood pressure 125/89, pulse 93, temperature 98.6 F (37 C), temperature source Oral, resp. rate 18, height 6' (1.829 m), weight 102.967 kg (227 lb), SpO2 100 %.Body mass index is 30.78 kg/(m^2).  General Appearance: Casual  Eye Contact::  Fair  Speech:  Slurred  Volume:  Decreased  Mood:  Depressed  Affect:  Blunt  Thought Process:  Goal Directed  Orientation:  Full (Time, Place, and Person)  Thought Content:  WDL  Suicidal Thoughts:  Yes.  with intent/plan  Homicidal Thoughts:  No  Memory:  Immediate;   Fair Recent;   Fair Remote;   Fair  Judgement:  Impaired  Insight:  Shallow  Psychomotor Activity:  Decreased  Concentration:  Fair  Recall:  Fair  Fund of Knowledge:Fair  Language: Fair  Akathisia:  No  Handed:  Right  AIMS (if indicated):     Assets:  Communication Skills Desire for Improvement Financial Resources/Insurance Resilience Social Support  ADL's:  Intact  Cognition: WNL  Sleep:  Number of Hours: 7.5   Treatment Plan Summary: Daily contact with patient to assess and evaluate symptoms and progress in treatment and  Medication management   Mr. Alberty has a history of severe, treament resistant depression awaiting transfer to [facility].  1. Suicidal ideation. E is too despondent to attmept. He denies any suicidal thoughts but I'm not sure how well we can trust that. He knows the consequences.  2. Mood. We continue remeron and Seroquel.  3. Diabetes. We continue metformin, ADA diet, SSI nd blood glucose monitoring  4. H/O DVT. He is on Eloquis.  5. S/P fall Surgery help with stiches is greatly appreciated.   6. 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 Resolved/stable restart lisinopril Lasix  7. Essential hypertension- continue metoprolol.  8. BPH. Continue finasteride and Flomax.  9. Weakness. Physical therapy evaluation appreciated.  10. Disposition. Transfer to [facility] when bed available. Discussed this with social work today. If we could come up with an alternate treatment plan that would be safe and agreeable I would be happy to entertain that as we are really not doing anything positive as far as treatment other than   continuing his current medicine.    , MD 01/26/2016, 6:25 PM  

## 2016-01-26 NOTE — Progress Notes (Signed)
Recreation Therapy Notes  Date: 02.27.17 Time: 3:00 pm Location: Community Room  Group Topic: Wellness  Goal Area(s) Addresses:  Patient will identify at least one item per dimension of health. Patient will examine areas they are deficient in.  Behavioral Response: Did not attend   Intervention: 6 Dimensions of Health  Activity: Patients were given a worksheet with the definitions of the 6 Dimensions of Health on it and a worksheet with each dimension written out. Patients were encouraged to write 2-3 things they are currently doing in each dimension.  Education: LRT educated patients on things they can do to improve each area.  Education Outcome: Patient did not attend group.  Clinical Observations/Feedback: Patient did not attend group.  Jacquelynn Cree, LRT/CTRS 01/26/2016 3:47 PM

## 2016-01-26 NOTE — [facility] Group Notes (Signed)
[facility] LCSW Aftercare Discharge Planning Group Note   01/26/2016 12:06 PM  Participation Quality:  Patient was called to group but did not attend.     Lulu Riding, MSW, LCSWA

## 2016-01-26 NOTE — Plan of Care (Signed)
Problem: Alteration in mood Goal: LTG-Patient reports reduction in suicidal thoughts (Patient reports reduction in suicidal thoughts and is able to verbalize a safety plan for whenever patient is feeling suicidal)  Outcome: Progressing Patient denies SI.      

## 2016-01-27 LAB — GLUCOSE, CAPILLARY
Glucose-Capillary: 96 mg/dL (ref 65–99)
Glucose-Capillary: 138 mg/dL — ABNORMAL HIGH (ref 65–99)
Glucose-Capillary: 166 mg/dL — ABNORMAL HIGH (ref 65–99)
Glucose-Capillary: 150 mg/dL — ABNORMAL HIGH (ref 65–99)
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
## 2016-01-27 NOTE — Plan of Care (Signed)
Problem: Alteration in mood Goal: STG-Patient is able to discuss feelings and issues (Patient is able to discuss feelings and issues leading to depression)  Outcome: Progressing Patient discussed his meeting with his wife.

## 2016-01-27 NOTE — Progress Notes (Signed)
Physical Therapy Treatment Patient Details Name: Christian [name] MRN: [id number] DOB: 06/04/1951 Today's Date: 01/27/2016    History of Present Illness Pt is a 65 y.o. male with PMH of bipolar disorder, mood swings, bilateral LE edema, diabetes, HTN B PE's, B LE DVT's, orthostatic hypotension, and cardiac arrhythmia.  Pt also with extended h/o ECT treatments.  Pt presented with mood swings and SOB.  Pt noted to have developed jerking in arms over last week.  Pt s/p fall 01/23/16.    PT Comments    Pt progressing ambulation distance well with rolling walker; ambulates 455 ft. Pt does walk watching floor with narrow base of support and limited ankle range, and is slightly less steady with turning, but no overt loss of balance. Pt participates well with bed exercises, as his preference. Pt questions as to why he fell, feeling confused by the reason. Discussed just continuing with rolling walker at this time for safety and using extra precautions. Pt agrees. Continue PT progressing higher level balance exercises for improved quality of ambulation to allow safe return home.   Follow Up Recommendations  Home health PT     Equipment Recommendations  Rolling walker with 5" wheels    Recommendations for Other Services       Precautions / Restrictions Restrictions Weight Bearing Restrictions: No    Mobility  Bed Mobility Overal bed mobility: Independent                Transfers Overall transfer level: Needs assistance Equipment used: Rolling walker (2 wheeled) Transfers: Sit to/from Stand Sit to Stand: Modified independent (Device/Increase time)         General transfer comment: Good hand placement with sit; uses no hands to stand without issue  Ambulation/Gait Ambulation/Gait assistance: Min guard Ambulation Distance (Feet): 455 Feet Assistive device: Rolling walker (2 wheeled) Gait Pattern/deviations: Step-through pattern;Decreased stride length;Decreased dorsiflexion -  right;Decreased dorsiflexion - left;Narrow base of support Gait velocity: decreased Gait velocity interpretation: Below normal speed for age/gender General Gait Details: mildly unsteady with turning, but no overt LOB. fluid cadence and steady speed on straight ways.    Stairs            Wheelchair Mobility    Modified Rankin (Stroke Patients Only)       Balance Overall balance assessment: Needs assistance Sitting-balance support: No upper extremity supported Sitting balance-Leahy Scale: Normal     Standing balance support: Bilateral upper extremity supported Standing balance-Leahy Scale: Good                      Cognition Arousal/Alertness: Awake/alert Behavior During Therapy: Flat affect Overall Cognitive Status: Within Functional Limits for tasks assessed                      Exercises General Exercises - Lower Extremity Heel Slides: AROM;Both;15 reps;Supine Hip ABduction/ADduction: AROM;Both;15 reps;Supine Straight Leg Raises: AROM;Both;15 reps;Supine Other Exercises Other Exercises: Bridging 15 reps; full range    General Comments General comments (skin integrity, edema, etc.): R eye swollen bruised; bandage over brow      Pertinent Vitals/Pain Pain Assessment: No/denies pain    Home Living                      Prior Function            PT Goals (current goals can now be found in the care plan section) Progress towards PT goals: Progressing toward goals  Frequency  Min 2X/week    PT Plan Current plan remains appropriate    Co-evaluation             End of Session Equipment Utilized During Treatment: Gait belt Activity Tolerance: Patient tolerated treatment well Patient left: in bed;with bed alarm set (alarm attached to pt)     Time: 8828-0034 PT Time Calculation (min) (ACUTE ONLY): 23 min  Charges:  $Gait Training: 8-22 mins $Therapeutic Exercise: 8-22 mins                    G Codes:      Kristeen Miss 01/27/2016, 3:40 PM

## 2016-01-27 NOTE — Progress Notes (Signed)
D: Patient still has a blunted affect. Denies SI/HI/AVH. Rates his depression at a 6. Stated he had a good talk with his wife but that she was still not willing to let him back in the house which was upsetting. State he had a numbing pain in his right eye at a 7.  A: Medication given with education. Encouragement provided. PRN tylenol given. Snack was provided. Urinal was provided to prevent patient was getting up frequently.  R: Patient was compliant with medication. He has remained calm and cooperative. Safety maintained with 15 min checks.

## 2016-01-27 NOTE — Progress Notes (Signed)
Denies SI/HI/AVH.   Continues to endorse depression and rates it as a 8/10.  Stays in his room in bed.  Support and encouragement offered.  Safety maintained.

## 2016-01-27 NOTE — Plan of Care (Signed)
Problem: Alteration in mood Goal: LTG-Pt's behavior demonstrates decreased signs of depression (Patient's behavior demonstrates decreased signs of depression to the point the patient is safe to return home and continue treatment in an outpatient setting)  Outcome: Not Progressing No change.  Continues to rate his depression as a 8/10

## 2016-01-27 NOTE — [facility] Group Notes (Signed)
[facility] Group Notes:  (Nursing/MHT/Case Management/Adjunct)  Date:  01/27/2016  Time:  9:58 PM  Type of Therapy:  Group Therapy  Participation Level:  Active  Participation Quality:  Appropriate  Affect:  Appropriate  Cognitive:  Appropriate  Insight:  Appropriate  Engagement in Group:  Engaged  Modes of Intervention:  Discussion  Summary of Progress/Problems:  Christian [name] 01/27/2016, 9:58 PM

## 2016-01-27 NOTE — [facility] Group Notes (Signed)
[facility] LCSW Group Therapy  01/27/2016 1:56 PM  Type of Therapy:  Group Therapy  Participation Level:  Did Not Attend  Summary of Progress/Problems: Patient was called to group but did not attend.   Lulu Riding, MSW, LCSWA 01/27/2016, 1:56 PM

## 2016-01-27 NOTE — Progress Notes (Signed)
[facility] MD Progress Note  01/27/2016 8:16 PM Christian [name]  MRN:  [id number]  Subjective: Follow-up Tuesday the 28th. Patient was actually little bit more awake and alert and sitting up today. Talked a little bit more. Still worried about where he will be able to go. He was able to be a little bit more clear with me about how much she regretted the incident that brought him into the hospital and how much he wished it had never happened. Denies having any homicidal or any current suicidal thoughts.   Principal Problem: Bipolar disorder, current episode depressed, severe, with psychotic features (HCC) Diagnosis:   Patient Active Problem List   Diagnosis Date Noted  . Fall [W19.XXXA]   . Laceration of eyebrow [S01.119A]   . Bipolar I disorder, most recent episode depressed, severe w psychosis (HCC) [F31.5] 12/22/2015  . Bipolar affective disorder, depressed, severe, with psychotic behavior (HCC) [F31.5]   . Bipolar I disorder, current or most recent episode depressed, with psychotic features (HCC) [F31.5]   . Bipolar I disorder, most recent episode depressed (HCC) [F31.30]   . Severe bipolar I disorder with depression (HCC) [F31.4]   . Bipolar I disorder, most recent episode depressed, severe with psychotic features (HCC) [F31.5]   . Overdose [T50.901A] 09/11/2015  . Acute respiratory failure with hypoxia (HCC) [J96.01] 09/05/2015  . Elevated troponin [R79.89] 09/05/2015  . Somnolence [R40.0] 09/05/2015  . Bipolar depression (HCC) [F31.30] 09/05/2015  . Acute bilateral deep vein thrombosis (DVT) of femoral veins (HCC) [I82.413] 09/05/2015  . Bilateral pulmonary embolism (HCC) [I26.99] 09/02/2015  . Labile hypertension [I10]   . Pulmonary embolism with acute cor pulmonale (HCC) [I26.09]   . Dyspnea [R06.00]   . Orthostatic hypotension [I95.1]   . Bipolar disorder, in partial remission, most recent episode depressed (HCC) [F31.75]   . Syncope, near [R55] 08/31/2015  . Affective psychosis,  bipolar (HCC) [F31.9]   . Bipolar disorder, now depressed (HCC) [F31.30] 08/28/2015  . Bipolar I disorder, severe, current or most recent episode depressed, with psychotic features (HCC) [F31.5]   . Pressure ulcer [L89.90] 07/30/2015  . Protein-calorie malnutrition, severe (HCC) [E43] 07/30/2015  . Dehydration [E86.0] 07/29/2015  . Bipolar disorder, current episode depressed, severe, with psychotic features (HCC) [F31.5] 07/09/2015  . Diabetes (HCC) [E11.9] 07/09/2015  . UTI (urinary tract infection) [N39.0] 06/19/2014  . Positive blood culture [R78.81] 06/19/2014  . Bipolar affective disorder, current episode manic with psychotic symptoms (HCC) [F31.2] 06/17/2014  . Type II or unspecified type diabetes mellitus with unspecified complication, uncontrolled [E11.8, E11.65] 06/02/2014  . Other and unspecified hyperlipidemia [E78.5] 06/02/2014  . OSA on CPAP [G47.33] 06/02/2014  . 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 Gout [M10.9] 06/02/2014  . Hypertension [I10]    Total Time spent with patient: 20 minutes  Past Psychiatric History: Depression.   Past Medical History:  Past Medical History  Diagnosis Date  . Mood swings (HCC)   . Type II or unspecified type diabetes mellitus with unspecified complication, uncontrolled 06/02/2014  . Other and unspecified hyperlipidemia 06/02/2014  . OSA on CPAP 06/02/2014  . 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 Bipolar disorder (HCC)   . Hypertension   . Gout   . Bipolar 1 disorder (HCC)   . Hx of blood clots   . Chronic kidney disease    History reviewed. No pertinent past surgical history. Family History:  Family History  Problem Relation Age of Onset  .  Dementia Mother   . Arthritis Father   . Hypertension Father   . Heart failure Neg Hx   . Kidney failure Neg Hx   . Cancer Neg Hx   . Mental illness Sister   . Diabetes Sister    Family Psychiatric   History: Bipolar disorder.al History:  History  Alcohol Use No     History  Drug Use No    Social History   Social History  . Marital Status: Married    Spouse Name: N/A  . Number of Children: N/A  . Years of Education: N/A   Social History Main Topics  . Smoking status: Never Smoker   . Smokeless tobacco: None  . Alcohol Use: No  . Drug Use: No  . Sexual Activity: Not Currently   Other Topics Concern  . None   Social History Narrative   *The patient has been married for over 30 years and is currently on disability. He lives with his wife in the [place] area and has one daughter.             Additional Social History:                         Sleep: Fair  Appetite:  Fair  Current Medications: Current Facility-Administered Medications  Medication Dose Route Frequency Provider Last Rate Last Dose  . acetaminophen (TYLENOL) tablet 650 mg  650 mg Oral Q6H PRN Audery Amel, MD   650 mg at 01/26/16 2137  . allopurinol (ZYLOPRIM) tablet 100 mg  100 mg Oral Daily Alford Highland, MD   100 mg at 01/27/16 0930  . alum & mag hydroxide-simeth (MAALOX/MYLANTA) 200-200-20 MG/5ML suspension 30 mL  30 mL Oral Q4H PRN Audery Amel, MD      . apixaban (ELIQUIS) tablet 5 mg  5 mg Oral BID Audery Amel, MD   5 mg at 01/27/16 7711  . aspirin EC tablet 81 mg  81 mg Oral Daily Audery Amel, MD   81 mg at 01/27/16 6579  . atorvastatin (LIPITOR) tablet 10 mg  10 mg Oral q1800 Audery Amel, MD   10 mg at 01/27/16 1748  . clonazePAM (KLONOPIN) tablet 0.5 mg  0.5 mg Oral TID Audery Amel, MD   0.5 mg at 01/27/16 1748  . dextrose 5 % solution   Intravenous Continuous Audery Amel, MD 10 mL/hr at 01/12/16 1130    . docusate sodium (COLACE) capsule 100 mg  100 mg Oral BID Audery Amel, MD   100 mg at 01/27/16 0383  . finasteride (PROSCAR) tablet 5 mg  5 mg Oral Daily Audery Amel, MD   5 mg at 01/27/16 3383  . furosemide (LASIX) tablet 20 mg  20 mg Oral Daily Wyatt Haste, MD   20 mg at 01/27/16 2919  . insulin aspart (novoLOG) injection 0-9 Units  0-9 Units Subcutaneous TID WC Darliss Ridgel, MD   2 Units at 01/27/16 1749  . lisinopril (PRINIVIL,ZESTRIL) tablet 40 mg  40 mg Oral Daily Wyatt Haste, MD   40 mg at 01/27/16 1660  . magnesium hydroxide (MILK OF MAGNESIA) suspension 30 mL  30 mL Oral Daily PRN Audery Amel, MD   30 mL at 01/08/16 2315  . metFORMIN (GLUCOPHAGE-XR) 24 hr tablet 500 mg  500 mg Oral BID WC Alford Highland, MD   500 mg at 01/27/16 1747  . metoprolol tartrate (LOPRESSOR) tablet  12.5 mg  12.5 mg Oral BID Alford Highland, MD   12.5 mg at 01/27/16 1610  . mirtazapine (REMERON) tablet 45 mg  45 mg Oral QHS Audery Amel, MD   45 mg at 01/26/16 2129  . multivitamin with minerals tablet 1 tablet  1 tablet Oral Daily Jimmy Footman, MD   1 tablet at 01/27/16 0930  . ondansetron (ZOFRAN-ODT) disintegrating tablet 4 mg  4 mg Oral Q8H PRN Audery Amel, MD   4 mg at 01/09/16 1340  . ondansetron (ZOFRAN-ODT) disintegrating tablet 4 mg  4 mg Oral Q8H PRN Audery Amel, MD   4 mg at 01/16/16 1739  . pantoprazole (PROTONIX) EC tablet 40 mg  40 mg Oral Daily Audery Amel, MD   40 mg at 01/27/16 0930  . polyethylene glycol (MIRALAX / GLYCOLAX) packet 17 g  17 g Oral Daily Audery Amel, MD   17 g at 01/27/16 9604  . QUEtiapine (SEROQUEL) tablet 400 mg  400 mg Oral QHS Audery Amel, MD   400 mg at 01/26/16 2129  . tamsulosin (FLOMAX) capsule 0.4 mg  0.4 mg Oral QPC supper Audery Amel, MD   0.4 mg at 01/27/16 1747    Lab Results:  Results for orders placed or performed during the hospital encounter of 12/22/15 (from the past 48 hour(s))  Glucose, capillary     Status: Abnormal   Collection Time: 01/25/16  8:24 PM  Result Value Ref Range   Glucose-Capillary 145 (H) 65 - 99 mg/dL  Glucose, capillary     Status: Abnormal   Collection Time: 01/26/16  6:57 AM  Result Value Ref Range   Glucose-Capillary 103 (H) 65 - 99 mg/dL   Glucose, capillary     Status: Abnormal   Collection Time: 01/26/16 12:12 PM  Result Value Ref Range   Glucose-Capillary 212 (H) 65 - 99 mg/dL  Glucose, capillary     Status: Abnormal   Collection Time: 01/26/16  4:44 PM  Result Value Ref Range   Glucose-Capillary 133 (H) 65 - 99 mg/dL   Comment 1 Notify RN   Glucose, capillary     Status: Abnormal   Collection Time: 01/26/16  8:38 PM  Result Value Ref Range   Glucose-Capillary 210 (H) 65 - 99 mg/dL  Glucose, capillary     Status: None   Collection Time: 01/27/16  7:03 AM  Result Value Ref Range   Glucose-Capillary 96 65 - 99 mg/dL   Comment 1 Notify RN   Glucose, capillary     Status: Abnormal   Collection Time: 01/27/16 12:18 PM  Result Value Ref Range   Glucose-Capillary 138 (H) 65 - 99 mg/dL   Comment 1 Notify RN    Comment 2 Document in Chart   Glucose, capillary     Status: Abnormal   Collection Time: 01/27/16  5:42 PM  Result Value Ref Range   Glucose-Capillary 166 (H) 65 - 99 mg/dL    Blood Alcohol level:  Lab Results  Component Value Date   ETH <5 12/21/2015   ETH <5 07/09/2015    Physical Findings: AIMS: Facial and Oral Movements Muscles of Facial Expression: None, normal Lips and Perioral Area: None, normal Jaw: None, normal Tongue: None, normal,Extremity Movements Upper (arms, wrists, hands, fingers): None, normal Lower (legs, knees, ankles, toes): None, normal, Trunk Movements Neck, shoulders, hips: None, normal, Overall Severity Severity of abnormal movements (highest score from questions above): None, normal Incapacitation due to abnormal movements: None,  normal Patient's awareness of abnormal movements (rate only patient's report): No Awareness, Dental Status Current problems with teeth and/or dentures?: No Does patient usually wear dentures?: No  CIWA:    COWS:     Musculoskeletal: Strength & Muscle Tone: decreased Gait & Station: unsteady Patient leans: N/A  Psychiatric Specialty  Exam: Review of Systems  Musculoskeletal: Positive for falls.  Psychiatric/Behavioral: Positive for depression and suicidal ideas.  All other systems reviewed and are negative.   Blood pressure 136/89, pulse 101, temperature 98.2 F (36.8 C), temperature source Oral, resp. rate 18, height 6' (1.829 m), weight 102.967 kg (227 lb), SpO2 100 %.Body mass index is 30.78 kg/(m^2).  General Appearance: Casual  Eye Contact::  Fair  Speech:  Slurred  Volume:  Decreased  Mood:  Depressed  Affect:  Blunt  Thought Process:  Goal Directed  Orientation:  Full (Time, Place, and Person)  Thought Content:  WDL  Suicidal Thoughts:  Yes.  with intent/plan  Homicidal Thoughts:  No  Memory:  Immediate;   Fair Recent;   Fair Remote;   Fair  Judgement:  Impaired  Insight:  Shallow  Psychomotor Activity:  Decreased  Concentration:  Fair  Recall:  [employer] of Knowledge:Fair  Language: Fair  Akathisia:  No  Handed:  Right  AIMS (if indicated):     Assets:  Communication Skills Desire for Improvement Financial Resources/Insurance Resilience Social Support  ADL's:  Intact  Cognition: WNL  Sleep:  Number of Hours: 8.25   Treatment Plan Summary: Daily contact with patient to assess and evaluate symptoms and progress in treatment and Medication management   Mr. Carusone has a history of severe, treament resistant depression awaiting transfer to [facility].  1. Suicidal ideation. E is too despondent to attmept. He denies any suicidal thoughts but I'm not sure how well we can trust that. He knows the consequences.  2. Mood. We continue remeron and Seroquel.  3. Diabetes. We continue metformin, ADA diet, SSI nd blood glucose monitoring  4. H/O DVT. He is on Eloquis.  5. S/P fall Surgery help with stiches is greatly appreciated.   6. 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 Resolved/stable restart lisinopril Lasix  7. Essential hypertension- continue metoprolol.  8. BPH. Continue  finasteride and Flomax.  9. Weakness. Physical therapy evaluation appreciated.  10. Disposition. Transfer to [facility] when bed available. Discussed this with social work today. If we could come up with an alternate treatment plan that would be safe and agreeable I would be happy to entertain that as we are really not doing anything positive as far as treatment other than continuing his current medicine. Patient doesn't look a lot better but doesn't look any worse. Doesn't seem to be developing any psychotic symptoms currently. I once again talk with him about the importance of maintaining some hope and some effort to get up out of bed and be more mobile. He says he is not having as much instability today and hasn't felt like he was going to fall. We are looking into any kind of discharge plan.  [name], MD 01/27/2016, 8:16 PM

## 2016-01-27 NOTE — Progress Notes (Signed)
Recreation Therapy Notes  Date: 02.28.17 Time: 3:00 pm Location: Community Room  Group Topic: Self-expression  Goal Area(s) Addresses:  Patient will effectively use art as a means of self-expression. Patient will recognize positive benefit of self-expression. Patient will be able to identify one emotion experienced during group session. Patient will identify use of art/self-expression as a coping skill.  Behavioral Response: Did not attend  Intervention: Two Faces of Me  Activity: Patients were given a blank face worksheet and instructed to draw or write how they felt when they were admitted to the hospital on one side of the face and draw or write how they want to feel when they are d/c on the other side of the face.  Education: LRT educated group on different forms of self-expression.  Education Outcome: Patient did not attend group.  Clinical Observations/Feedback: Patient did not attend group.  Jacquelynn Cree, LRT/CTRS 01/27/2016 4:07 PM

## 2016-01-28 LAB — CBC
HCT: 36.1 % — ABNORMAL LOW (ref 40.0–52.0)
Hemoglobin: 12.1 g/dL — ABNORMAL LOW (ref 13.0–18.0)
MCH: 29.2 pg (ref 26.0–34.0)
MCHC: 33.5 g/dL (ref 32.0–36.0)
MCV: 87.1 fL (ref 80.0–100.0)
Platelets: 150 10*3/uL (ref 150–440)
RBC: 4.15 MIL/uL — ABNORMAL LOW (ref 4.40–5.90)
RDW: 14.1 % (ref 11.5–14.5)
WBC: 3.7 10*3/uL — ABNORMAL LOW (ref 3.8–10.6)

## 2016-01-28 LAB — GLUCOSE, CAPILLARY
Glucose-Capillary: 98 mg/dL (ref 65–99)
Glucose-Capillary: 125 mg/dL — ABNORMAL HIGH (ref 65–99)
Glucose-Capillary: 109 mg/dL — ABNORMAL HIGH (ref 65–99)
Glucose-Capillary: 127 mg/dL — ABNORMAL HIGH (ref 65–99)

## 2016-01-28 LAB — CREATININE, SERUM
Creatinine, Ser: 1.18 mg/dL (ref 0.61–1.24)
GFR calc Af Amer: >60 mL/min (ref >60)
GFR calc non Af Amer: >60 mL/min (ref >60)

## 2016-01-28 MED ORDER — SODIUM CHLORIDE FLUSH 0.9 % IV SOLN
INTRAVENOUS | Status: AC
  Administered 2016-01-28: 3 mL
  Filled 2016-01-28: qty 9

## 2016-01-28 NOTE — Progress Notes (Signed)
D: Observed pt in room laying down. Patient alert and oriented x4. Patient denies SI/HI/AVH. Pt affect is depressed. Pt rated depression 5/10 and anxiety 5/10, pt stated his mood is "a little better today." Pt could not identify a reason for slight mood improvement. Pt indicated that he went to wrap-up group today. Laceration above pt's eye is bandaged, and the bandage is clean, dry, and intact. Area above eye appears less swollen. Pt tremors were much less pronounced this evening. Pt reports regular bowel movements, and is refusing Colace. A: Offered active listening and support. Provided therapeutic communication. Administered scheduled medications. Encouraged pt too attend more groups and be more physically active on the unit.  R: Pt pleasant and cooperative. Pt medication compliant. Will continue Q15 min. checks. Safety maintained.

## 2016-01-28 NOTE — Progress Notes (Signed)
[facility] MD Progress Note  01/28/2016 4:41 PM Christian [name] Radi  MRN:  [id number]  Subjective: Follow-up for Wednesday, March 1. Patient has no new complaints. He says he is physically feeling pretty good. He denies feeling any pain. He still got a little dizzy when he stood up. Mentally he says he is feeling about the same as usual. Denies suicidal ideation.  Principal Problem: Bipolar disorder, current episode depressed, severe, with psychotic features ([place]) Diagnosis:   Patient Active Problem List   Diagnosis Date Noted  . Fall [W19.XXXA]   . Laceration of eyebrow [S01.119A]   . Bipolar I disorder, most recent episode depressed, severe w psychosis ([place]) [F31.5] 12/22/2015  . Bipolar affective disorder, depressed, severe, with psychotic behavior ([place]) [F31.5]   . Bipolar I disorder, current or most recent episode depressed, with psychotic features ([place]) [F31.5]   . Bipolar I disorder, most recent episode depressed ([place]) [F31.30]   . Severe bipolar I disorder with depression ([place]) [F31.4]   . Bipolar I disorder, most recent episode depressed, severe with psychotic features ([place]) [F31.5]   . Overdose [T50.901A] 09/11/2015  . Acute respiratory failure with hypoxia ([place]) [J96.01] 09/05/2015  . Elevated troponin [R79.89] 09/05/2015  . Somnolence [R40.0] 09/05/2015  . Bipolar depression ([place]) [F31.30] 09/05/2015  . Acute bilateral deep vein thrombosis (DVT) of femoral veins (HCC) [I82.413] 09/05/2015  . Bilateral pulmonary embolism ([place]) [I26.99] 09/02/2015  . Labile hypertension [I10]   . Pulmonary embolism with acute cor pulmonale ([place]) [I26.09]   . Dyspnea [R06.00]   . Orthostatic hypotension [I95.1]   . Bipolar disorder, in partial remission, most recent episode depressed ([place]) [F31.75]   . Syncope, near [R55] 08/31/2015  . Affective psychosis, bipolar ([place]) [F31.9]   . Bipolar disorder, now depressed ([place]) [F31.30] 08/28/2015  . Bipolar I disorder, severe, current or most recent episode  depressed, with psychotic features ([place]) [F31.5]   . Pressure ulcer [L89.90] 07/30/2015  . Protein-calorie malnutrition, severe ([place]) [E43] 07/30/2015  . Dehydration [E86.0] 07/29/2015  . Bipolar disorder, current episode depressed, severe, with psychotic features ([place]) [F31.5] 07/09/2015  . Diabetes ([place]) [E11.9] 07/09/2015  . UTI (urinary tract infection) [N39.0] 06/19/2014  . Positive blood culture [R78.81] 06/19/2014  . Bipolar affective disorder, current episode manic with psychotic symptoms ([place]) [F31.2] 06/17/2014  . Type II or unspecified type diabetes mellitus with unspecified complication, uncontrolled [E11.8, E11.65] 06/02/2014  . Other and unspecified hyperlipidemia [E78.5] 06/02/2014  . OSA on CPAP [G47.33] 06/02/2014  . 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 Gout [M10.9] 06/02/2014  . Hypertension [I10]    Total Time spent with patient: 20 minutes  Past Psychiatric History: Depression.   Past Medical History:  Past Medical History  Diagnosis Date  . Mood swings ([place])   . Type II or unspecified type diabetes mellitus with unspecified complication, uncontrolled 06/02/2014  . Other and unspecified hyperlipidemia 06/02/2014  . OSA on CPAP 06/02/2014  . 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 Bipolar disorder ([place])   . Hypertension   . Gout   . Bipolar 1 disorder ([place])   . Hx of blood clots   . Chronic kidney disease    History reviewed. No pertinent past surgical history. Family History:  Family History  Problem Relation Age of Onset  . Dementia Mother   . Arthritis Father   . Hypertension Father   . Heart failure Neg Hx   . Kidney failure Neg Hx   .  Cancer Neg Hx   . Mental illness Sister   . Diabetes Sister    Family Psychiatric  History: Bipolar disorder.al History:  History  Alcohol Use No     History  Drug Use No    Social History   Social History  . Marital  Status: Married    Spouse Name: N/A  . Number of Children: N/A  . Years of Education: N/A   Social History Main Topics  . Smoking status: Never Smoker   . Smokeless tobacco: None  . Alcohol Use: No  . Drug Use: No  . Sexual Activity: Not Currently   Other Topics Concern  . None   Social History Narrative   *The patient has been married for over 30 years and is currently on disability. He lives with his wife in the [place] area and has one daughter.             Additional Social History:                         Sleep: Fair  Appetite:  Fair  Current Medications: Current Facility-Administered Medications  Medication Dose Route Frequency Provider Last Rate Last Dose  . acetaminophen (TYLENOL) tablet 650 mg  650 mg Oral Q6H PRN Gonzella Lex, MD   650 mg at 01/26/16 2137  . allopurinol (ZYLOPRIM) tablet 100 mg  100 mg Oral Daily Loletha Grayer, MD   100 mg at 01/28/16 0826  . alum & mag hydroxide-simeth (MAALOX/MYLANTA) 200-200-20 MG/5ML suspension 30 mL  30 mL Oral Q4H PRN Gonzella Lex, MD      . apixaban (ELIQUIS) tablet 5 mg  5 mg Oral BID Gonzella Lex, MD   5 mg at 01/28/16 0827  . aspirin EC tablet 81 mg  81 mg Oral Daily Gonzella Lex, MD   81 mg at 01/28/16 0827  . atorvastatin (LIPITOR) tablet 10 mg  10 mg Oral q1800 Gonzella Lex, MD   10 mg at 01/27/16 1748  . clonazePAM (KLONOPIN) tablet 0.5 mg  0.5 mg Oral TID Gonzella Lex, MD   0.5 mg at 01/28/16 0827  . dextrose 5 % solution   Intravenous Continuous Gonzella Lex, MD 10 mL/hr at 01/12/16 1130    . docusate sodium (COLACE) capsule 100 mg  100 mg Oral BID Gonzella Lex, MD   100 mg at 01/28/16 7517  . finasteride (PROSCAR) tablet 5 mg  5 mg Oral Daily Gonzella Lex, MD   5 mg at 01/28/16 0017  . furosemide (LASIX) tablet 20 mg  20 mg Oral Daily Lytle Butte, MD   20 mg at 01/28/16 0829  . insulin aspart (novoLOG) injection 0-9 Units  0-9 Units Subcutaneous TID WC Chauncey Mann, MD   1 Units at  01/28/16 1225  . lisinopril (PRINIVIL,ZESTRIL) tablet 40 mg  40 mg Oral Daily Lytle Butte, MD   40 mg at 01/28/16 0829  . magnesium hydroxide (MILK OF MAGNESIA) suspension 30 mL  30 mL Oral Daily PRN Gonzella Lex, MD   30 mL at 01/08/16 2315  . metFORMIN (GLUCOPHAGE-XR) 24 hr tablet 500 mg  500 mg Oral BID WC Loletha Grayer, MD   500 mg at 01/28/16 0830  . metoprolol tartrate (LOPRESSOR) tablet 12.5 mg  12.5 mg Oral BID Loletha Grayer, MD   12.5 mg at 01/28/16 0830  . mirtazapine (REMERON) tablet 45 mg  45 mg Oral QHS  Gonzella Lex, MD   45 mg at 01/27/16 2232  . multivitamin with minerals tablet 1 tablet  1 tablet Oral Daily Hildred Priest, MD   1 tablet at 01/28/16 0830  . ondansetron (ZOFRAN-ODT) disintegrating tablet 4 mg  4 mg Oral Q8H PRN Gonzella Lex, MD   4 mg at 01/09/16 1340  . ondansetron (ZOFRAN-ODT) disintegrating tablet 4 mg  4 mg Oral Q8H PRN Gonzella Lex, MD   4 mg at 01/16/16 1739  . pantoprazole (PROTONIX) EC tablet 40 mg  40 mg Oral Daily Gonzella Lex, MD   40 mg at 01/28/16 0831  . polyethylene glycol (MIRALAX / GLYCOLAX) packet 17 g  17 g Oral Daily Gonzella Lex, MD   17 g at 01/27/16 9977  . QUEtiapine (SEROQUEL) tablet 400 mg  400 mg Oral QHS Gonzella Lex, MD   400 mg at 01/27/16 2230  . tamsulosin (FLOMAX) capsule 0.4 mg  0.4 mg Oral QPC supper Gonzella Lex, MD   0.4 mg at 01/27/16 1747    Lab Results:  Results for orders placed or performed during the hospital encounter of 12/22/15 (from the past 48 hour(s))  Glucose, capillary     Status: Abnormal   Collection Time: 01/26/16  4:44 PM  Result Value Ref Range   Glucose-Capillary 133 (H) 65 - 99 mg/dL   Comment 1 Notify RN   Glucose, capillary     Status: Abnormal   Collection Time: 01/26/16  8:38 PM  Result Value Ref Range   Glucose-Capillary 210 (H) 65 - 99 mg/dL  Glucose, capillary     Status: None   Collection Time: 01/27/16  7:03 AM  Result Value Ref Range   Glucose-Capillary 96  65 - 99 mg/dL   Comment 1 Notify RN   Glucose, capillary     Status: Abnormal   Collection Time: 01/27/16 12:18 PM  Result Value Ref Range   Glucose-Capillary 138 (H) 65 - 99 mg/dL   Comment 1 Notify RN    Comment 2 Document in Chart   Glucose, capillary     Status: Abnormal   Collection Time: 01/27/16  5:42 PM  Result Value Ref Range   Glucose-Capillary 166 (H) 65 - 99 mg/dL  Glucose, capillary     Status: Abnormal   Collection Time: 01/27/16  9:01 PM  Result Value Ref Range   Glucose-Capillary 150 (H) 65 - 99 mg/dL  Glucose, capillary     Status: None   Collection Time: 01/28/16  6:36 AM  Result Value Ref Range   Glucose-Capillary 98 65 - 99 mg/dL  Creatinine, serum     Status: None   Collection Time: 01/28/16  7:09 AM  Result Value Ref Range   Creatinine, Ser 1.18 0.61 - 1.24 mg/dL   GFR calc non Af Amer >60 >60 mL/min   GFR calc Af Amer >60 >60 mL/min    Comment: (NOTE) The eGFR has been calculated using the CKD EPI equation. This calculation has not been validated in all clinical situations. eGFR's persistently <60 mL/min signify possible Chronic Kidney Disease.   CBC     Status: Abnormal   Collection Time: 01/28/16  7:09 AM  Result Value Ref Range   WBC 3.7 (L) 3.8 - 10.6 K/uL   RBC 4.15 (L) 4.40 - 5.90 MIL/uL   Hemoglobin 12.1 (L) 13.0 - 18.0 g/dL   HCT 36.1 (L) 40.0 - 52.0 %   MCV 87.1 80.0 - 100.0 fL  MCH 29.2 26.0 - 34.0 pg   MCHC 33.5 32.0 - 36.0 g/dL   RDW 14.1 11.5 - 14.5 %   Platelets 150 150 - 440 K/uL  Glucose, capillary     Status: Abnormal   Collection Time: 01/28/16 12:08 PM  Result Value Ref Range   Glucose-Capillary 125 (H) 65 - 99 mg/dL   Comment 1 Notify RN     Blood Alcohol level:  Lab Results  Component Value Date   ETH <5 12/21/2015   ETH <5 07/09/2015    Physical Findings: AIMS: Facial and Oral Movements Muscles of Facial Expression: None, normal Lips and Perioral Area: None, normal Jaw: None, normal Tongue: None,  normal,Extremity Movements Upper (arms, wrists, hands, fingers): None, normal Lower (legs, knees, ankles, toes): None, normal, Trunk Movements Neck, shoulders, hips: None, normal, Overall Severity Severity of abnormal movements (highest score from questions above): None, normal Incapacitation due to abnormal movements: None, normal Patient's awareness of abnormal movements (rate only patient's report): No Awareness, Dental Status Current problems with teeth and/or dentures?: No Does patient usually wear dentures?: No  CIWA:    COWS:     Musculoskeletal: Strength & Muscle Tone: decreased Gait & Station: unsteady Patient leans: N/A  Psychiatric Specialty Exam: Review of Systems  Musculoskeletal: Positive for falls.  Psychiatric/Behavioral: Positive for depression and suicidal ideas.  All other systems reviewed and are negative.   Blood pressure 134/90, pulse 81, temperature 98 F (36.7 C), temperature source Oral, resp. rate 20, height 6' (1.829 m), weight 102.967 kg (227 lb), SpO2 100 %.Body mass index is 30.78 kg/(m^2).  General Appearance: Casual  Eye Contact::  Fair  Speech:  Slurred  Volume:  Decreased  Mood:  Depressed  Affect:  Blunt  Thought Process:  Goal Directed  Orientation:  Full (Time, Place, and Person)  Thought Content:  WDL  Suicidal Thoughts:  Yes.  with intent/plan  Homicidal Thoughts:  No  Memory:  Immediate;   Fair Recent;   Fair Remote;   Fair  Judgement:  Impaired  Insight:  Shallow  Psychomotor Activity:  Decreased  Concentration:  Fair  Recall:  [place]: Fair  Akathisia:  No  Handed:  Right  AIMS (if indicated):     Assets:  Communication Skills Desire for Improvement Financial Resources/Insurance Resilience Social Support  ADL's:  Intact  Cognition: WNL  Sleep:  Number of Hours: 7.25   Treatment Plan Summary: Daily contact with patient to assess and evaluate symptoms and progress in treatment and  Medication management   Mr. Angello has a history of severe, treament resistant depression awaiting transfer to [facility].  1. Suicidal ideation. E is too despondent to attmept. He denies any suicidal thoughts but I'm not sure how well we can trust that. He knows the consequences.  2. Mood. We continue remeron and Seroquel.  3. Diabetes. We continue metformin, ADA diet, SSI nd blood glucose monitoring  4. H/O DVT. He is on Eloquis.  5. S/P fall Surgery help with stiches is greatly appreciated.   6. 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 Resolved/stable restart lisinopril Lasix  7. Essential hypertension- continue metoprolol.  8. BPH. Continue finasteride and Flomax.  9. Weakness. Physical therapy evaluation appreciated.  10. Disposition. Transfer to [facility] when bed available. Discussed this with social work today. If we could come up with an alternate treatment plan that would be safe and agreeable I would be happy to entertain that as we are really  not doing anything positive as far as treatment other than continuing his current medicine. No change to overall treatment plan. Vital signs stable. No behavior change. We are still looking for a discharge plan. Patient once again encouraged to please be more active in therapeutic groups.  Alethia Berthold, MD 01/28/2016, 4:41 PM
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
## 2016-01-28 NOTE — Plan of Care (Signed)
Problem: Diagnosis: Increased Risk For Suicide Attempt Goal: LTG-Patient Will Report Improved Mood and Deny Suicidal LTG (by discharge) Patient will report improved mood and deny suicidal ideation.  Outcome: Progressing Pt did report mood is "a little better than normal" and denies SI

## 2016-01-28 NOTE — [facility] Group Notes (Signed)
[facility] LCSW Group Therapy  01/28/2016 2:36 PM  Type of Therapy:  Group Therapy  Participation Level:  Did Not Attend  Summary of Progress/Problems: Patient was called to group but did not attend.  Lulu Riding, MSW, LCSWA 01/28/2016, 2:36 PM

## 2016-01-28 NOTE — [facility] Group Notes (Signed)
[facility] Group Notes:  (Nursing/MHT/Case Management/Adjunct)  Date:  01/28/2016  Time:  1:35 PM  Type of Therapy:  Group Therapy  Participation Level:  Did Not Attend  Participation Quality:   Summary of Progress/Problems:  Mayra Neer 01/28/2016, 1:35 PM

## 2016-01-28 NOTE — Progress Notes (Signed)
Recreation Therapy Notes  Date: 03.01.17 Time: 3:00 pm Location: Community Room  Group Topic: Self-esteem  Goal Area(s) Addresses:  Patients will write at least one positive trait about themselves. Patients will verbalize benefit of having a healthy self-esteem.  Behavioral Response: Did not attend  Intervention: I Am  Activity: Patients were given a worksheet with the letter I on it and instructed to write as many positive traits about themselves inside the letter I.  Education: LRT educated patients on ways they can increase their self-esteem.  Education Outcome: Patient did not attend group.  Clinical Observations/Feedback: Patient did not attend group.  Jacquelynn Cree, LRT/CTRS 01/28/2016 3:54 PM

## 2016-01-28 NOTE — [facility] Group Notes (Signed)
[facility] LCSW Aftercare Discharge Planning Group Note   01/28/2016 2:37 PM  Participation Quality:  Patient was called to group but did not attend.  Lulu Riding, MSW, LCSWA

## 2016-01-29 LAB — GLUCOSE, CAPILLARY
Glucose-Capillary: 85 mg/dL (ref 65–99)
Glucose-Capillary: 205 mg/dL — ABNORMAL HIGH (ref 65–99)
Glucose-Capillary: 76 mg/dL (ref 65–99)

## 2016-01-29 MED ORDER — LISINOPRIL 20 MG PO TABS
20.0000 mg | ORAL_TABLET | Freq: Every day | ORAL | Status: DC
  Administered 2016-01-30 – 2016-02-03 (×5): 20 mg via ORAL
  Filled 2016-01-29 (×5): qty 1

## 2016-01-29 NOTE — Plan of Care (Signed)
Problem: Alteration in mood Goal: LTG-Pt's behavior demonstrates decreased signs of depression (Patient's behavior demonstrates decreased signs of depression to the point the patient is safe to return home and continue treatment in an outpatient setting)  Outcome: Progressing Rates depression as a 6/10.  Verbalizes that he feels some better
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
## 2016-01-29 NOTE — Plan of Care (Signed)
Problem: Alteration in mood Goal: LTG-Patient reports reduction in suicidal thoughts (Patient reports reduction in suicidal thoughts and is able to verbalize a safety plan for whenever patient is feeling suicidal)  Outcome: Progressing Denies SI     

## 2016-01-29 NOTE — Progress Notes (Signed)
[facility] MD Progress Note  01/29/2016 4:24 PM Doctor Sheahan Mcavoy  MRN:  [id number]  Subjective: Follow-up for Thursday, March 2. Patient tells me he is not feeling well today. Particularly this morning he was feeling more tired and run down. He missed lunch. He did do his physical therapy today but afterwards is feeling more tired. He tells me this afternoon he is slightly better. He is not able to very specifically described to me where the bad feeling is coming from his mood or physical but it seems possibly to be a combination. He did get a visit from his wife last night and contrary to his hopes she is still holding out on having him come home. Social work is still looking for placement and has a possible lead on a family care home. Principal Problem: Bipolar disorder, current episode depressed, severe, with psychotic features ([place]) Diagnosis:   Patient Active Problem List   Diagnosis Date Noted  . Fall [W19.XXXA]   . Laceration of eyebrow [S01.119A]   . Bipolar I disorder, most recent episode depressed, severe w psychosis ([place]) [F31.5] 12/22/2015  . Bipolar affective disorder, depressed, severe, with psychotic behavior ([place]) [F31.5]   . Bipolar I disorder, current or most recent episode depressed, with psychotic features ([place]) [F31.5]   . Bipolar I disorder, most recent episode depressed ([place]) [F31.30]   . Severe bipolar I disorder with depression ([place]) [F31.4]   . Bipolar I disorder, most recent episode depressed, severe with psychotic features ([place]) [F31.5]   . Overdose [T50.901A] 09/11/2015  . Acute respiratory failure with hypoxia ([place]) [J96.01] 09/05/2015  . Elevated troponin [R79.89] 09/05/2015  . Somnolence [R40.0] 09/05/2015  . Bipolar depression ([place]) [F31.30] 09/05/2015  . Acute bilateral deep vein thrombosis (DVT) of femoral veins (HCC) [I82.413] 09/05/2015  . Bilateral pulmonary embolism ([place]) [I26.99] 09/02/2015  . Labile hypertension [I10]   . Pulmonary embolism with acute cor  pulmonale ([place]) [I26.09]   . Dyspnea [R06.00]   . Orthostatic hypotension [I95.1]   . Bipolar disorder, in partial remission, most recent episode depressed ([place]) [F31.75]   . Syncope, near [R55] 08/31/2015  . Affective psychosis, bipolar ([place]) [F31.9]   . Bipolar disorder, now depressed ([place]) [F31.30] 08/28/2015  . Bipolar I disorder, severe, current or most recent episode depressed, with psychotic features ([place]) [F31.5]   . Pressure ulcer [L89.90] 07/30/2015  . Protein-calorie malnutrition, severe ([place]) [E43] 07/30/2015  . Dehydration [E86.0] 07/29/2015  . Bipolar disorder, current episode depressed, severe, with psychotic features ([place]) [F31.5] 07/09/2015  . Diabetes ([place]) [E11.9] 07/09/2015  . UTI (urinary tract infection) [N39.0] 06/19/2014  . Positive blood culture [R78.81] 06/19/2014  . Bipolar affective disorder, current episode manic with psychotic symptoms ([place]) [F31.2] 06/17/2014  . Type II or unspecified type diabetes mellitus with unspecified complication, uncontrolled [E11.8, E11.65] 06/02/2014  . Other and unspecified hyperlipidemia [E78.5] 06/02/2014  . OSA on CPAP [G47.33] 06/02/2014  . 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 Gout [M10.9] 06/02/2014  . Hypertension [I10]    Total Time spent with patient: 20 minutes  Past Psychiatric History: Depression.   Past Medical History:  Past Medical History  Diagnosis Date  . Mood swings ([place])   . Type II or unspecified type diabetes mellitus with unspecified complication, uncontrolled 06/02/2014  . Other and unspecified hyperlipidemia 06/02/2014  . OSA on CPAP 06/02/2014  . 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 Bipolar disorder ([place])   . Hypertension   .  Gout   . Bipolar 1 disorder ([place])   . Hx of blood clots   . Chronic kidney disease    History reviewed. No pertinent past surgical history. Family History:  Family History   Problem Relation Age of Onset  . Dementia Mother   . Arthritis Father   . Hypertension Father   . Heart failure Neg Hx   . Kidney failure Neg Hx   . Cancer Neg Hx   . Mental illness Sister   . Diabetes Sister    Family Psychiatric  History: Bipolar disorder.al History:  History  Alcohol Use No     History  Drug Use No    Social History   Social History  . Marital Status: Married    Spouse Name: N/A  . Number of Children: N/A  . Years of Education: N/A   Social History Main Topics  . Smoking status: Never Smoker   . Smokeless tobacco: None  . Alcohol Use: No  . Drug Use: No  . Sexual Activity: Not Currently   Other Topics Concern  . None   Social History Narrative   *The patient has been married for over 30 years and is currently on disability. He lives with his wife in the [place] area and has one daughter.             Additional Social History:                         Sleep: Fair  Appetite:  Fair  Current Medications: Current Facility-Administered Medications  Medication Dose Route Frequency Provider Last Rate Last Dose  . acetaminophen (TYLENOL) tablet 650 mg  650 mg Oral Q6H PRN Gonzella Lex, MD   650 mg at 01/26/16 2137  . allopurinol (ZYLOPRIM) tablet 100 mg  100 mg Oral Daily Loletha Grayer, MD   100 mg at 01/29/16 1001  . alum & mag hydroxide-simeth (MAALOX/MYLANTA) 200-200-20 MG/5ML suspension 30 mL  30 mL Oral Q4H PRN Gonzella Lex, MD      . apixaban (ELIQUIS) tablet 5 mg  5 mg Oral BID Gonzella Lex, MD   5 mg at 01/29/16 1002  . aspirin EC tablet 81 mg  81 mg Oral Daily Gonzella Lex, MD   81 mg at 01/29/16 1002  . atorvastatin (LIPITOR) tablet 10 mg  10 mg Oral q1800 Gonzella Lex, MD   10 mg at 01/28/16 1700  . clonazePAM (KLONOPIN) tablet 0.5 mg  0.5 mg Oral TID Gonzella Lex, MD   0.5 mg at 01/29/16 1002  . dextrose 5 % solution   Intravenous Continuous Gonzella Lex, MD 10 mL/hr at 01/12/16 1130    . docusate sodium  (COLACE) capsule 100 mg  100 mg Oral BID Gonzella Lex, MD   100 mg at 01/29/16 1002  . finasteride (PROSCAR) tablet 5 mg  5 mg Oral Daily Gonzella Lex, MD   5 mg at 01/29/16 1002  . furosemide (LASIX) tablet 20 mg  20 mg Oral Daily Lytle Butte, MD   20 mg at 01/29/16 1002  . insulin aspart (novoLOG) injection 0-9 Units  0-9 Units Subcutaneous TID WC Chauncey Mann, MD   2 Units at 01/29/16 1207  . lisinopril (PRINIVIL,ZESTRIL) tablet 40 mg  40 mg Oral Daily Lytle Butte, MD   40 mg at 01/28/16 0829  . magnesium hydroxide (MILK OF MAGNESIA) suspension 30 mL  30 mL Oral  Daily PRN Gonzella Lex, MD   30 mL at 01/08/16 2315  . metFORMIN (GLUCOPHAGE-XR) 24 hr tablet 500 mg  500 mg Oral BID WC Loletha Grayer, MD   500 mg at 01/29/16 1002  . metoprolol tartrate (LOPRESSOR) tablet 12.5 mg  12.5 mg Oral BID Loletha Grayer, MD   12.5 mg at 01/28/16 2231  . mirtazapine (REMERON) tablet 45 mg  45 mg Oral QHS Gonzella Lex, MD   45 mg at 01/28/16 2231  . multivitamin with minerals tablet 1 tablet  1 tablet Oral Daily Hildred Priest, MD   1 tablet at 01/29/16 1001  . ondansetron (ZOFRAN-ODT) disintegrating tablet 4 mg  4 mg Oral Q8H PRN Gonzella Lex, MD   4 mg at 01/09/16 1340  . ondansetron (ZOFRAN-ODT) disintegrating tablet 4 mg  4 mg Oral Q8H PRN Gonzella Lex, MD   4 mg at 01/16/16 1739  . pantoprazole (PROTONIX) EC tablet 40 mg  40 mg Oral Daily Gonzella Lex, MD   40 mg at 01/29/16 1002  . polyethylene glycol (MIRALAX / GLYCOLAX) packet 17 g  17 g Oral Daily Gonzella Lex, MD   17 g at 01/29/16 1002  . QUEtiapine (SEROQUEL) tablet 400 mg  400 mg Oral QHS Gonzella Lex, MD   400 mg at 01/28/16 2231  . tamsulosin (FLOMAX) capsule 0.4 mg  0.4 mg Oral QPC supper Gonzella Lex, MD   0.4 mg at 01/28/16 1659    Lab Results:  Results for orders placed or performed during the hospital encounter of 12/22/15 (from the past 48 hour(s))  Glucose, capillary     Status: Abnormal    Collection Time: 01/27/16  5:42 PM  Result Value Ref Range   Glucose-Capillary 166 (H) 65 - 99 mg/dL  Glucose, capillary     Status: Abnormal   Collection Time: 01/27/16  9:01 PM  Result Value Ref Range   Glucose-Capillary 150 (H) 65 - 99 mg/dL  Glucose, capillary     Status: None   Collection Time: 01/28/16  6:36 AM  Result Value Ref Range   Glucose-Capillary 98 65 - 99 mg/dL  Creatinine, serum     Status: None   Collection Time: 01/28/16  7:09 AM  Result Value Ref Range   Creatinine, Ser 1.18 0.61 - 1.24 mg/dL   GFR calc non Af Amer >60 >60 mL/min   GFR calc Af Amer >60 >60 mL/min    Comment: (NOTE) The eGFR has been calculated using the CKD EPI equation. This calculation has not been validated in all clinical situations. eGFR's persistently <60 mL/min signify possible Chronic Kidney Disease.   CBC     Status: Abnormal   Collection Time: 01/28/16  7:09 AM  Result Value Ref Range   WBC 3.7 (L) 3.8 - 10.6 K/uL   RBC 4.15 (L) 4.40 - 5.90 MIL/uL   Hemoglobin 12.1 (L) 13.0 - 18.0 g/dL   HCT 36.1 (L) 40.0 - 52.0 %   MCV 87.1 80.0 - 100.0 fL   MCH 29.2 26.0 - 34.0 pg   MCHC 33.5 32.0 - 36.0 g/dL   RDW 14.1 11.5 - 14.5 %   Platelets 150 150 - 440 K/uL  Glucose, capillary     Status: Abnormal   Collection Time: 01/28/16 12:08 PM  Result Value Ref Range   Glucose-Capillary 125 (H) 65 - 99 mg/dL   Comment 1 Notify RN   Glucose, capillary     Status: Abnormal  Collection Time: 01/28/16  4:49 PM  Result Value Ref Range   Glucose-Capillary 109 (H) 65 - 99 mg/dL   Comment 1 Notify RN   Glucose, capillary     Status: Abnormal   Collection Time: 01/28/16  9:32 PM  Result Value Ref Range   Glucose-Capillary 127 (H) 65 - 99 mg/dL  Glucose, capillary     Status: None   Collection Time: 01/29/16  6:45 AM  Result Value Ref Range   Glucose-Capillary 85 65 - 99 mg/dL  Glucose, capillary     Status: Abnormal   Collection Time: 01/29/16 11:32 AM  Result Value Ref Range    Glucose-Capillary 205 (H) 65 - 99 mg/dL    Blood Alcohol level:  Lab Results  Component Value Date   ETH <5 12/21/2015   ETH <5 07/09/2015    Physical Findings: AIMS: Facial and Oral Movements Muscles of Facial Expression: None, normal Lips and Perioral Area: None, normal Jaw: None, normal Tongue: None, normal,Extremity Movements Upper (arms, wrists, hands, fingers): None, normal Lower (legs, knees, ankles, toes): None, normal, Trunk Movements Neck, shoulders, hips: None, normal, Overall Severity Severity of abnormal movements (highest score from questions above): None, normal Incapacitation due to abnormal movements: None, normal Patient's awareness of abnormal movements (rate only patient's report): No Awareness, Dental Status Current problems with teeth and/or dentures?: No Does patient usually wear dentures?: No  CIWA:    COWS:     Musculoskeletal: Strength & Muscle Tone: decreased Gait & Station: unsteady Patient leans: N/A  Psychiatric Specialty Exam: Review of Systems  Constitutional: Positive for malaise/fatigue.  Musculoskeletal: Positive for falls.  Neurological: Positive for weakness.  Psychiatric/Behavioral: Positive for depression and suicidal ideas.  All other systems reviewed and are negative.   Blood pressure 95/56, pulse 88, temperature 98.2 F (36.8 C), temperature source Oral, resp. rate 18, height 6' (1.829 m), weight 102.967 kg (227 lb), SpO2 100 %.Body mass index is 30.78 kg/(m^2).  General Appearance: Casual  Eye Contact::  Fair  Speech:  Slurred  Volume:  Decreased  Mood:  Depressed  Affect:  Blunt  Thought Process:  Goal Directed  Orientation:  Full (Time, Place, and Person)  Thought Content:  WDL  Suicidal Thoughts:  Yes.  with intent/plan  Homicidal Thoughts:  No  Memory:  Immediate;   Fair Recent;   Fair Remote;   Fair  Judgement:  Impaired  Insight:  Shallow  Psychomotor Activity:  Decreased  Concentration:  Fair  Recall:  [place]: Fair  Akathisia:  No  Handed:  Right  AIMS (if indicated):     Assets:  Communication Skills Desire for Improvement Financial Resources/Insurance Resilience Social Support  ADL's:  Intact  Cognition: WNL  Sleep:  Number of Hours: 6.75   Treatment Plan Summary: Daily contact with patient to assess and evaluate symptoms and progress in treatment and Medication management   Christian [name] has a history of severe, treament resistant depression awaiting transfer to [facility]  1. Suicidal ideation. E is too despondent to attmept. He denies any suicidal thoughts but I'm not sure how well we can trust that. He knows the consequences.  2. Mood. We continue remeron and Seroquel.  3. Diabetes. We continue metformin, ADA diet, SSI nd blood glucose monitoring  4. H/O DVT. He is on Eloquis.  5. S/P fall Surgery help with stiches is greatly appreciated.   6. 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 Resolved/stable restart lisinopril Lasix  7. Essential hypertension- continue metoprolol.  8. BPH. Continue finasteride and Flomax.  9. Weakness. Physical therapy evaluation appreciated.  10. Disposition. Transfer to [facility] when bed available. Discussed this with social work today. If we could come up with an alternate treatment plan that would be safe and agreeable I would be happy to entertain that as we are really not doing anything positive as far as treatment other than continuing his current medicine. Looking into possible family care home placement while still awaiting possible referral to long-term hospital stay. I note that his blood pressure drops in the morning which is probably part of why he is still at risk of falling. He realistically told me that he has a fear of falling which is one reason he is probably not getting up more. Encouraged him to continue with his physical therapy. I'm going to look at his medicine and see if there is anything  that might help to keep his blood pressure from dropping early in the morning. No other change to psychiatric medicine. Alethia Berthold, MD 01/29/2016, 4:24 PM

## 2016-01-29 NOTE — Progress Notes (Signed)
Recreation Therapy Notes  Date: 03.02.17 Time: 3:00 pm Location: Community Room  Group Topic: Leisure Education  Goal Area(s) Addresses:  Patient will write down one healthy leisure activity. Patient will verbalize benefit of using leisure as a Associate Professor.  Behavioral Response: Did not attend  Intervention: Leisure Time  Activity: Patients were instructed to write one healthy leisure activity. Patients were given a Leisure Time Clock worksheet and instructed to fill it out. Patients were asked to give 10 positive emotions. Patients connected the leisure activities to the positive emotions.  Education: LRT educated patients on what they need to participate in leisure.  Education Outcome: Patient did not attend group.   Clinical Observations/Feedback: Patient did not attend group.  Jacquelynn Cree, LRT/CTRS 01/29/2016 4:21 PM

## 2016-01-29 NOTE — Plan of Care (Signed)
Problem: Diagnosis: Increased Risk For Suicide Attempt Goal: STG-Patient Will Comply With Medication Regime Outcome: Progressing Pt compliant with medications

## 2016-01-29 NOTE — Progress Notes (Signed)
D: Observed pt in room laying down. Patient alert and oriented x4. Patient denies SI/HI/AVH. Pt affect is depressed. Pt stated his day was "good." Pt rated depression 6/10 and anxiety 5/10, pt indicated his mood is "better." Pt had visit with wife today, and indicated that is improved his mood but he's still disappointed that he can't come home.  Laceration above pt's eye is bandaged, and the bandage is clean, dry, and intact. Area above eye appears less swollen. Pt tremors were less pronounced. Pt reports regular bowel movements, and is refusing Colace. A: Offered active listening and support. Provided therapeutic communication. Administered scheduled medications. Encouraged pt too attend more groups and be more physically active on the unit.  R: Pt pleasant and cooperative. Pt medication compliant. Will continue Q15 min. checks. Safety maintained.

## 2016-01-29 NOTE — Progress Notes (Signed)
Physical Therapy Treatment Patient Details Name: Christian [name] MRN: [id number] DOB: [birth year] Today's Date: 01/29/2016    History of Present Illness Pt is a 65 y.o. male with PMH of bipolar disorder, mood swings, bilateral LE edema, diabetes, HTN B PE's, B LE DVT's, orthostatic hypotension, and cardiac arrhythmia.  Pt also with extended h/o ECT treatments.  Pt presented with mood swings and SOB.  Pt noted to have developed jerking in arms over last week.  Pt s/p fall 01/23/16.    PT Comments    Pt ambulated 400 feet with CGA and RW.  Pt reported he still does not feel comfortable ambulating due to his fall.  Pt performed standing static and dynamic balance activities.  Pt was noted to have decreased balance on R LE compared to L LE, specifically in tandem.  PT will focus on dynamic balance activities next session.   Follow Up Recommendations  Home health PT     Equipment Recommendations       Recommendations for Other Services       Precautions / Restrictions Precautions Precautions: Fall Restrictions Weight Bearing Restrictions: No    Mobility  Bed Mobility Overal bed mobility: Independent                Transfers Overall transfer level: Needs assistance Equipment used: Rolling walker (2 wheeled) Transfers: Sit to/from Stand Sit to Stand: Min guard         General transfer comment: Good hand placement with sit; uses no hands to stand without issue  Ambulation/Gait Ambulation/Gait assistance: Min guard Ambulation Distance (Feet): 400 Feet Assistive device: Rolling walker (2 wheeled) Gait Pattern/deviations: Step-through pattern;Decreased step length - right;Decreased step length - left;Narrow base of support Gait velocity: decreased   General Gait Details: mildly unsteady with turning    Stairs            Wheelchair Mobility    Modified Rankin (Stroke Patients Only)       Balance Overall balance assessment: Needs  assistance Sitting-balance support: Feet supported Sitting balance-Leahy Scale: Normal     Standing balance support: Bilateral upper extremity supported (RW) Standing balance-Leahy Scale: Good                      Cognition Arousal/Alertness: Awake/alert Behavior During Therapy: Flat affect Overall Cognitive Status: Within Functional Limits for tasks assessed                      Exercises  Static standing balance feet together eyes open and closed (CGA assist: 15 seconds each exercise). Dynamic reaching standing balance with feet together.  Dynamic: Reaching in three directions in available AROM (CGA assist: 15 seconds each exercise). Tandem Static and dynamic balance (L> R tandem balance).  Dynamic: Reaching in three directions in available AROM (Min assist with L, CGA assist with R: 10 seconds each exercise). 360 turning in both directions (CGA assist: 1 set each direction); Single leg stance balance attempted, could not perform (min assist).    General Comments   Nursing was contacted and cleared pt for physical therapy.  Pt was agreeable and tolerated session well.       Pertinent Vitals/Pain Pain Assessment: No/denies pain  See flow sheet for vitals.     Home Living                      Prior Function  PT Goals (current goals can now be found in the care plan section) Acute Rehab PT Goals Patient Stated Goal: to go home  PT Goal Formulation: With patient Time For Goal Achievement: 02/02/16 Potential to Achieve Goals: Fair Progress towards PT goals: Progressing toward goals    Frequency  Min 2X/week    PT Plan Current plan remains appropriate    Co-evaluation             End of Session Equipment Utilized During Treatment: Gait belt Activity Tolerance: Patient tolerated treatment well Patient left: in bed;with bed alarm set     Time: 1101-1126 PT Time Calculation (min) (ACUTE ONLY): 25 min  Charges:                        G Codes:      Lyndel Safe, SPT Lyndel Safe 01/29/2016, 1:27 PM

## 2016-01-29 NOTE — [facility] Group Notes (Signed)
[facility] Group Notes:  (Nursing/MHT/Case Management/Adjunct)  Date:  01/29/2016  Time:  1:47 PM  Type of Therapy:  Psychoeducational Skills  Participation Level:  Did Not Attend  Christian [name] 01/29/2016, 1:47 PM

## 2016-01-29 NOTE — [facility] Group Notes (Signed)
[facility] Group Notes:  (Nursing/MHT/Case Management/Adjunct)  Date:  01/29/2016  Time:  8:43 AM  Type of Therapy:  Community Meeting   Participation Level:  Did Not Attend  Christian [name] 01/29/2016, 8:43 AM

## 2016-01-29 NOTE — Progress Notes (Signed)
D:  Patient verbalizes that his depression, hopelessness and anxiety are all a 6/10.  Verbalizes "I have the blahs, I just feel tired"  Appetite fair.  During lunch patient states "I don't feel like eating"  Up for dinner and ate 100% of dinner.  Affect blunted.  Poor hygiene although verbalized on several different occasions that he was going to take a shower but when asked kept saying "I will do it later"   A:  Medication given per orders.  Attempted to remove stiches.  2 stiches removed and incision started oozing blood so left the remainder of sutures intact.  Support and encouragement offered.  Safety maintained. R:  Medication compliant.  No group attendance.

## 2016-01-29 NOTE — [facility] Group Notes (Signed)
[facility] LCSW Group Therapy  01/29/2016 12:43 PM  Type of Therapy:  Group Therapy  Participation Level:  Did Not Attend  Summary of Progress/Problems: Patient was called to group but did not attend.  Lulu Riding, MSW, LCSWA 01/29/2016, 12:43 PM

## 2016-01-30 LAB — GLUCOSE, CAPILLARY
Glucose-Capillary: 143 mg/dL — ABNORMAL HIGH (ref 65–99)
Glucose-Capillary: 139 mg/dL — ABNORMAL HIGH (ref 65–99)
Glucose-Capillary: 186 mg/dL — ABNORMAL HIGH (ref 65–99)
Glucose-Capillary: 134 mg/dL — ABNORMAL HIGH (ref 65–99)
Glucose-Capillary: 178 mg/dL — ABNORMAL HIGH (ref 65–99)

## 2016-01-30 NOTE — [facility] Group Notes (Signed)
[facility] LCSW Aftercare Discharge Planning Group Note   01/30/2016 2:19 PM  Participation Quality:  Patient attended group and introduced himself sharing his SMART goal is to "come to group for a change".   Mood/Affect:  Blunted  Depression Rating:  8  Anxiety Rating:  5  Thoughts of Suicide:  No Will you contract for safety?   NA  Current AVH:  No  Plan for Discharge/Comments:  On [facility] wait list but may need a place to stay like [facility] and need a follow up.  Transportation Means: may need assistance at discharge  Supports: family support  Lulu Riding, MSW, [employer]

## 2016-01-30 NOTE — Plan of Care (Signed)
Problem: Alteration in mood Goal: LTG-Patient reports reduction in suicidal thoughts (Patient reports reduction in suicidal thoughts and is able to verbalize a safety plan for whenever patient is feeling suicidal)  Outcome: Progressing Patient denies SI at this time.      

## 2016-01-30 NOTE — [facility] Group Notes (Signed)
[facility] Group Notes:  (Nursing/MHT/Case Management/Adjunct)  Date:  01/30/2016  Time:  2:43 PM  Type of Therapy:  Psychoeducational Skills  Participation Level:  Did Not Attend  Marquette Old 01/30/2016, 2:43 PM

## 2016-01-30 NOTE — Progress Notes (Signed)
Per [place], staff needs to try and take out sutures tomorrow 3/4, reported to night shift RN

## 2016-01-30 NOTE — [facility] Group Notes (Signed)
[facility] LCSW Group Therapy  01/30/2016 2:19 PM  Type of Therapy:  Group Therapy  Participation Level:  Did Not Attend   Summary of Progress/Problems: Patient was called to group but did not attend.  Lulu Riding, MSW, LCSWA 01/30/2016, 2:19 PM

## 2016-01-30 NOTE — Progress Notes (Signed)
D:  Per pt self inventory pt reports sleeping fair, appetite good, energy level low, ability to pay attention good, rates depression at a 7 out of 10, hopelessness at a 6 out of 10, anxiety at a 5 out of 10, denies SI/HI/AVH, goal today: "staying on point, following directions", flat during interaction, pt sat in dayroom and read the paper after breakfast this am.     A:  Emotional support provided, Encouraged pt to continue with treatment plan and attend all group activities, q15 min checks maintained for safety.  R:  Pt is feeling a little better, went to one group today for the first time, pleasant and cooperative with staff and other patients on the unit.

## 2016-01-30 NOTE — Progress Notes (Signed)
D: Patient was somewhat hard to arouse this evening when trying to given medications. VS and CBG were both stable. When he woke up he said he was just exhausted but he didn't know from what. The patient was given a snack and bottle of water that he was encouraged to drink. He rates his depression at a 6. He denies SI/HI/AVH. He also denies pain.  A: Medication was given with education. Encouragement was provided.  R: Patient drank bottle of water and ate snacks. He was compliant with medication. He has remained calm and cooperative. Safety maintained with 15 min checks.

## 2016-01-30 NOTE — [facility] Group Notes (Signed)
[facility] Group Notes:  (Nursing/MHT/Case Management/Adjunct)  Date:  01/30/2016  Time:  1:14 AM  Type of Therapy:  Group Therapy  Participation Level:  Did Not Attend    Summary of Progress/Problems:  Veva Holes 01/30/2016, 1:14 AM

## 2016-01-30 NOTE — Progress Notes (Signed)
Recreation Therapy Notes  Date: 03.03.17 Time: 3:00 pm Location: Craft Room  Group Topic: Problem solving, Teamwork, Communication  Goal Area(s) Addresses:  Patient will effectively work with peer towards shared goal. Patient will identify skills used to make activity successful. Patient will identify benefit of use group skills effectively post d/c.  Behavioral Response: Did not attend  Intervention: [employer]  Activity: Patients were given 15 pipe cleaners and instructed to build a free standing tower. After approximately 3-5 minutes, patients were told to put their dominant hand behind their back. After approximately 3-5 minutes, patients were told to stop talking to each other.   Education: LRT educated patients on healthy support systems.  Education Outcome: Patient did not attend group.   Clinical Observations/Feedback: Patient did not attend group.  Jacquelynn Cree, LRT/CTRS 01/30/2016 4:05 PM

## 2016-01-31 LAB — CREATININE, SERUM
Creatinine, Ser: 1.22 mg/dL (ref 0.61–1.24)
GFR calc Af Amer: >60 mL/min (ref >60)
GFR calc non Af Amer: >60 mL/min (ref >60)

## 2016-01-31 LAB — GLUCOSE, CAPILLARY
Glucose-Capillary: 108 mg/dL — ABNORMAL HIGH (ref 65–99)
Glucose-Capillary: 125 mg/dL — ABNORMAL HIGH (ref 65–99)
Glucose-Capillary: 118 mg/dL — ABNORMAL HIGH (ref 65–99)
Glucose-Capillary: 128 mg/dL — ABNORMAL HIGH (ref 65–99)

## 2016-01-31 NOTE — Progress Notes (Signed)
[facility] MD Progress Note  01/31/2016 5:23 PM Christian [name]  MRN:  [id number]  Subjective: Pt is a 65 yo male seen for follow up. He was lying in bed. He stated that he is waiting for his stitches to be removed.  Patient tells me he is not feeling well today and continues to feel tired and depressed. He has been awaiting placement and working with SW. No acute symptoms noted.   Nursing staff keeping an eye on him and helping with ADL. He is complaint with meds. Social work is still looking for placement and has a possible lead on a family care home.   Principal Problem: Bipolar disorder, current episode depressed, severe, with psychotic features ([place]) Diagnosis:   Patient Active Problem List   Diagnosis Date Noted  . Fall [W19.XXXA]   . Laceration of eyebrow [S01.119A]   . Bipolar I disorder, most recent episode depressed, severe w psychosis ([place]) [F31.5] 12/22/2015  . Bipolar affective disorder, depressed, severe, with psychotic behavior ([place]) [F31.5]   . Bipolar I disorder, current or most recent episode depressed, with psychotic features ([place]) [F31.5]   . Bipolar I disorder, most recent episode depressed ([place]) [F31.30]   . Severe bipolar I disorder with depression ([place]) [F31.4]   . Bipolar I disorder, most recent episode depressed, severe with psychotic features ([place]) [F31.5]   . Overdose [T50.901A] 09/11/2015  . Acute respiratory failure with hypoxia ([place]) [J96.01] 09/05/2015  . Elevated troponin [R79.89] 09/05/2015  . Somnolence [R40.0] 09/05/2015  . Bipolar depression ([place]) [F31.30] 09/05/2015  . Acute bilateral deep vein thrombosis (DVT) of femoral veins (H[facility]) [I82.413] 09/05/2015  . Bilateral pulmonary embolism ([place]) [I26.99] 09/02/2015  . Labile hypertension [I10]   . Pulmonary embolism with acute cor pulmonale ([place]) [I26.09]   . Dyspnea [R06.00]   . Orthostatic hypotension [I95.1]   . Bipolar disorder, in partial remission, most recent episode depressed ([place]) [F31.75]   .  Syncope, near [R55] 08/31/2015  . Affective psychosis, bipolar ([place]) [F31.9]   . Bipolar disorder, now depressed ([place]) [F31.30] 08/28/2015  . Bipolar I disorder, severe, current or most recent episode depressed, with psychotic features ([place]) [F31.5]   . Pressure ulcer [L89.90] 07/30/2015  . Protein-calorie malnutrition, severe ([place]) [E43] 07/30/2015  . Dehydration [E86.0] 07/29/2015  . Bipolar disorder, current episode depressed, severe, with psychotic features ([place]) [F31.5] 07/09/2015  . Diabetes ([place]) [E11.9] 07/09/2015  . UTI (urinary tract infection) [N39.0] 06/19/2014  . Positive blood culture [R78.81] 06/19/2014  . Bipolar affective disorder, current episode manic with psychotic symptoms ([place]) [F31.2] 06/17/2014  . Type II or unspecified type diabetes mellitus with unspecified complication, uncontrolled [E11.8, E11.65] 06/02/2014  . Other and unspecified hyperlipidemia [E78.5] 06/02/2014  . OSA on CPAP [G47.33] 06/02/2014  . 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 Gout [M10.9] 06/02/2014  . Hypertension [I10]    Total Time spent with patient: 20 minutes  Past Psychiatric History: Depression.   Past Medical History:  Past Medical History  Diagnosis Date  . Mood swings ([place])   . Type II or unspecified type diabetes mellitus with unspecified complication, uncontrolled 06/02/2014  . Other and unspecified hyperlipidemia 06/02/2014  . OSA on CPAP 06/02/2014  . 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 Bipolar disorder ([place])   . Hypertension   . Gout   . Bipolar 1 disorder ([place])   . Hx of blood clots   . Chronic kidney disease    History  reviewed. No pertinent past surgical history. Family History:  Family History  Problem Relation Age of Onset  . Dementia Mother   . Arthritis Father   . Hypertension Father   . Heart failure Neg Hx   . Kidney failure Neg Hx   . Cancer Neg Hx   . Mental  illness Sister   . Diabetes Sister    Family Psychiatric  History: Bipolar disorder.al History:  History  Alcohol Use No     History  Drug Use No    Social History   Social History  . Marital Status: Married    Spouse Name: N/A  . Number of Children: N/A  . Years of Education: N/A   Social History Main Topics  . Smoking status: Never Smoker   . Smokeless tobacco: None  . Alcohol Use: No  . Drug Use: No  . Sexual Activity: Not Currently   Other Topics Concern  . None   Social History Narrative   *The patient has been married for over 30 years and is currently on disability. He lives with his wife in the [place] area and has one daughter.             Additional Social History:                         Sleep: Fair  Appetite:  Fair  Current Medications: Current Facility-Administered Medications  Medication Dose Route Frequency Provider Last Rate Last Dose  . acetaminophen (TYLENOL) tablet 650 mg  650 mg Oral Q6H PRN Gonzella Lex, MD   650 mg at 01/26/16 2137  . allopurinol (ZYLOPRIM) tablet 100 mg  100 mg Oral Daily Loletha Grayer, MD   100 mg at 01/31/16 0848  . alum & mag hydroxide-simeth (MAALOX/MYLANTA) 200-200-20 MG/5ML suspension 30 mL  30 mL Oral Q4H PRN Gonzella Lex, MD      . apixaban (ELIQUIS) tablet 5 mg  5 mg Oral BID Gonzella Lex, MD   5 mg at 01/31/16 0850  . aspirin EC tablet 81 mg  81 mg Oral Daily Gonzella Lex, MD   81 mg at 01/31/16 0851  . atorvastatin (LIPITOR) tablet 10 mg  10 mg Oral q1800 Gonzella Lex, MD   10 mg at 01/31/16 1701  . clonazePAM (KLONOPIN) tablet 0.5 mg  0.5 mg Oral TID Gonzella Lex, MD   0.5 mg at 01/31/16 1701  . dextrose 5 % solution   Intravenous Continuous Gonzella Lex, MD 10 mL/hr at 01/12/16 1130    . docusate sodium (COLACE) capsule 100 mg  100 mg Oral BID Gonzella Lex, MD   100 mg at 01/31/16 0852  . finasteride (PROSCAR) tablet 5 mg  5 mg Oral Daily Gonzella Lex, MD   5 mg at 01/31/16 0849  .  furosemide (LASIX) tablet 20 mg  20 mg Oral Daily Lytle Butte, MD   20 mg at 01/31/16 0853  . insulin aspart (novoLOG) injection 0-9 Units  0-9 Units Subcutaneous TID WC Chauncey Mann, MD   1 Units at 01/31/16 1213  . lisinopril (PRINIVIL,ZESTRIL) tablet 20 mg  20 mg Oral Daily Gonzella Lex, MD   20 mg at 01/31/16 0849  . magnesium hydroxide (MILK OF MAGNESIA) suspension 30 mL  30 mL Oral Daily PRN Gonzella Lex, MD   30 mL at 01/08/16 2315  . metFORMIN (GLUCOPHAGE-XR) 24 hr tablet 500 mg  500 mg  Oral BID WC Richard Wieting, MD   500 mg at 01/31/16 1701  . metoprolol tartrate (LOPRESSOR) tablet 12.5 mg  12.5 mg Oral BID Richard Wieting, MD   12.5 mg at 01/31/16 0851  . mirtazapine (REMERON) tablet 45 mg  45 mg Oral QHS John T Clapacs, MD   45 mg at 01/30/16 2215  . multivitamin with minerals tablet 1 tablet  1 tablet Oral Daily Andrea Hernandez-Gonzalez, MD   1 tablet at 01/31/16 0851  . ondansetron (ZOFRAN-ODT) disintegrating tablet 4 mg  4 mg Oral Q8H PRN John T Clapacs, MD   4 mg at 01/09/16 1340  . ondansetron (ZOFRAN-ODT) disintegrating tablet 4 mg  4 mg Oral Q8H PRN John T Clapacs, MD   4 mg at 01/16/16 1739  . pantoprazole (PROTONIX) EC tablet 40 mg  40 mg Oral Daily John T Clapacs, MD   40 mg at 01/31/16 0850  . polyethylene glycol (MIRALAX / GLYCOLAX) packet 17 g  17 g Oral Daily John T Clapacs, MD   17 g at 01/31/16 0848  . QUEtiapine (SEROQUEL) tablet 400 mg  400 mg Oral QHS John T Clapacs, MD   400 mg at 01/30/16 2215  . tamsulosin (FLOMAX) capsule 0.4 mg  0.4 mg Oral QPC supper John T Clapacs, MD   0.4 mg at 01/31/16 1702    Lab Results:  Results for orders placed or performed during the hospital encounter of 12/22/15 (from the past 48 hour(s))  Glucose, capillary     Status: Abnormal   Collection Time: 01/29/16  8:38 PM  Result Value Ref Range   Glucose-Capillary 143 (H) 65 - 99 mg/dL  Glucose, capillary     Status: Abnormal   Collection Time: 01/30/16  6:39 AM  Result  Value Ref Range   Glucose-Capillary 139 (H) 65 - 99 mg/dL  Glucose, capillary     Status: Abnormal   Collection Time: 01/30/16 12:10 PM  Result Value Ref Range   Glucose-Capillary 186 (H) 65 - 99 mg/dL  Glucose, capillary     Status: Abnormal   Collection Time: 01/30/16  4:51 PM  Result Value Ref Range   Glucose-Capillary 134 (H) 65 - 99 mg/dL  Glucose, capillary     Status: Abnormal   Collection Time: 01/30/16  8:40 PM  Result Value Ref Range   Glucose-Capillary 178 (H) 65 - 99 mg/dL   Comment 1 Notify RN    Comment 2 Document in Chart   Glucose, capillary     Status: Abnormal   Collection Time: 01/31/16  6:40 AM  Result Value Ref Range   Glucose-Capillary 108 (H) 65 - 99 mg/dL   Comment 1 Notify RN    Comment 2 Document in Chart   Creatinine, serum     Status: None   Collection Time: 01/31/16  6:44 AM  Result Value Ref Range   Creatinine, Ser 1.22 0.61 - 1.24 mg/dL   GFR calc non Af Amer >60 >60 mL/min   GFR calc Af Amer >60 >60 mL/min    Comment: (NOTE) The eGFR has been calculated using the CKD EPI equation. This calculation has not been validated in all clinical situations. eGFR's persistently <60 mL/min signify possible Chronic Kidney Disease.   Glucose, capillary     Status: Abnormal   Collection Time: 01/31/16 12:10 PM  Result Value Ref Range   Glucose-Capillary 125 (H) 65 - 99 mg/dL  Glucose, capillary     Status: Abnormal   Collection Time: 01/31/16  4:44 PM    Result Value Ref Range   Glucose-Capillary 118 (H) 65 - 99 mg/dL   Comment 1 Notify RN     Blood Alcohol level:  Lab Results  Component Value Date   ETH <5 12/21/2015   ETH <5 07/09/2015    Physical Findings: AIMS: Facial and Oral Movements Muscles of Facial Expression: None, normal Lips and Perioral Area: None, normal Jaw: None, normal Tongue: None, normal,Extremity Movements Upper (arms, wrists, hands, fingers): None, normal Lower (legs, knees, ankles, toes): None, normal, Trunk  Movements Neck, shoulders, hips: None, normal, Overall Severity Severity of abnormal movements (highest score from questions above): None, normal Incapacitation due to abnormal movements: None, normal Patient's awareness of abnormal movements (rate only patient's report): No Awareness, Dental Status Current problems with teeth and/or dentures?: No Does patient usually wear dentures?: No  CIWA:    COWS:     Musculoskeletal: Strength & Muscle Tone: decreased Gait & Station: unsteady Patient leans: N/A  Psychiatric Specialty Exam: Review of Systems  Constitutional: Positive for malaise/fatigue.  Musculoskeletal: Positive for falls.  Neurological: Positive for weakness.  Psychiatric/Behavioral: Positive for depression and suicidal ideas.  All other systems reviewed and are negative.   Blood pressure 110/76, pulse 97, temperature 98.2 F (36.8 C), temperature source Oral, resp. rate 18, height 6' (1.829 m), weight 227 lb (102.967 kg), SpO2 100 %.Body mass index is 30.78 kg/(m^2).  General Appearance: Casual  Eye Contact::  Fair  Speech:  Slurred  Volume:  Decreased  Mood:  Depressed  Affect:  Blunt  Thought Process:  Goal Directed  Orientation:  Full (Time, Place, and Person)  Thought Content:  WDL  Suicidal Thoughts:  Yes.  with intent/plan  Homicidal Thoughts:  No  Memory:  Immediate;   Fair Recent;   Fair Remote;   Fair  Judgement:  Impaired  Insight:  Shallow  Psychomotor Activity:  Decreased  Concentration:  Fair  Recall:  [place]: Fair  Akathisia:  No  Handed:  Right  AIMS (if indicated):     Assets:  Communication Skills Desire for Improvement Financial Resources/Insurance Resilience Social Support  ADL's:  Intact  Cognition: WNL  Sleep:  Number of Hours: 7   Treatment Plan Summary: Daily contact with patient to assess and evaluate symptoms and progress in treatment and Medication management   Christian [name] has a history of  severe, treament resistant depression awaiting transfer to [facility].  1. Suicidal ideation. E is too despondent to attmept. He denies any suicidal thoughts but I'm not sure how well we can trust that. He knows the consequences.  2. Mood. We continue remeron and Seroquel.  3. Diabetes. We continue metformin, ADA diet, SSI nd blood glucose monitoring  4. H/O DVT. He is on Eloquis.  5. S/P fall Surgery help with stiches is greatly appreciated.   6. 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 Resolved/stable restart lisinopril Lasix  7. Essential hypertension- continue metoprolol.  8. BPH. Continue finasteride and Flomax.  9. Weakness. Physical therapy evaluation appreciated.  10. Disposition. Transfer to [facility] when bed available. Discussed this with nursing staff today about his meds and placement.  Encouraged him to continue with his physical therapy    .Rainey Pines, MD 01/31/2016, 5:23 PM

## 2016-01-31 NOTE — [facility] Group Notes (Signed)
[facility] LCSW Group Therapy  01/31/2016 2:18 PM  Type of Therapy:  Group Therapy  Participation Level:  Did Not Attend  Modes of Intervention:  Activity, Discussion, Education, Socialization and Support  Summary of Progress/Problems: Mindfulness: Patient discussed mindfulness and relaxing techniques and why they are beneficial. Pt discussed ways to incorporate mindfulness in their lives. Pt practiced a mindfulness techique and discussed how it made them feel.  [employer]  MSW, LCSWA   01/31/2016, 2:18 PM

## 2016-01-31 NOTE — Progress Notes (Signed)
Pt has been pleasant and cooperative. Pt's mood and affect continues to be depressed. Pt completed his self inventory. He rated his depression a 6/10, his hopelessness a 5/10 and his anxiety 5/10. Pt still not attending unit activities. Pt moving about the unit via W/C. Pt denies SI and A/V hallucinations.

## 2016-01-31 NOTE — Plan of Care (Signed)
Problem: Ineffective individual coping Goal: LTG: Patient will report a decrease in negative feelings Outcome: Progressing Pt attended one group earlier today and indicated that he talked during the group. Pt rated depression 6/10 and anxiety 5/10, and felt that his mood is improving.

## 2016-01-31 NOTE — Progress Notes (Signed)
D: Observed pt in room resting on bed. Patient alert and oriented x4. Patient denies SI/HI/AVH. Pt affect is depressed..When asked about his mood pt stated "Feel pretty good.Marland Kitchenit's improving." Pt indicated that he went to a group during the day and "I talked a little bit" during the group. Pt rated depression 6/10 and anxiety 5/10 Pt did joke a bit with Clinical research associate and smiled. Pt laceration above right eye is covered in bandage that is clean, dry and intact. Pt has bed alarm and uses wheel chair as pt is a high fall risk and has fallen during this admission. Pt contacts staff appropriately for toileting needs. Pt isolative to room this evening.  A: Offered active listening and support. Provided therapeutic communication. Administered scheduled medications. Encouraged pt to attend more groups and be more physically active on the unit. R: Pt pleasant and cooperative. Pt medication compliant. Will continue Q15 min. checks. Safety maintained.

## 2016-02-01 LAB — GLUCOSE, CAPILLARY
Glucose-Capillary: 92 mg/dL (ref 65–99)
Glucose-Capillary: 146 mg/dL — ABNORMAL HIGH (ref 65–99)
Glucose-Capillary: 93 mg/dL (ref 65–99)
Glucose-Capillary: 157 mg/dL — ABNORMAL HIGH (ref 65–99)

## 2016-02-01 NOTE — [facility] Group Notes (Signed)
[facility] Group Notes:  (Nursing/MHT/Case Management/Adjunct)  Date:  02/01/2016  Time:  9:13 AM  Type of Therapy:  Community Meeting   Participation Level:  Active  Participation Quality:  Appropriate and Attentive  Affect:  Appropriate  Cognitive:  Alert, Appropriate and Oriented  Insight:  Appropriate  Engagement in Group:  Engaged  Modes of Intervention:  Discussion and Education  Summary of Progress/Problems:  Christian [name] 02/01/2016, 9:13 AM

## 2016-02-01 NOTE — Progress Notes (Signed)
[facility] MD Progress Note  02/01/2016 3:16 PM Christian [name]  MRN:  [id number]  Subjective: Pt is a 65 yo male seen for follow up. Staff reported that he is becoming more alert and to the shower last night. He is awaiting placement in a new group home. He has been responding well to his medications and is not feeling tired. His depressive symptoms are also improving. He currently denied having any suicidal ideations or plans. He appeared calm and alert during the interview. No acute symptoms noted.   Nursing staff keeping an eye on him and helping with ADL. He is complaint with meds. Social work is still looking for placement and has a possible lead on a family care home.   Principal Problem: Bipolar disorder, current episode depressed, severe, with psychotic features ([place]) Diagnosis:   Patient Active Problem List   Diagnosis Date Noted  . Fall [W19.XXXA]   . Laceration of eyebrow [S01.119A]   . Bipolar I disorder, most recent episode depressed, severe w psychosis ([place]) [F31.5] 12/22/2015  . Bipolar affective disorder, depressed, severe, with psychotic behavior ([place]) [F31.5]   . Bipolar I disorder, current or most recent episode depressed, with psychotic features ([place]) [F31.5]   . Bipolar I disorder, most recent episode depressed ([place]) [F31.30]   . Severe bipolar I disorder with depression ([place]) [F31.4]   . Bipolar I disorder, most recent episode depressed, severe with psychotic features ([place]) [F31.5]   . Overdose [T50.901A] 09/11/2015  . Acute respiratory failure with hypoxia ([place]) [J96.01] 09/05/2015  . Elevated troponin [R79.89] 09/05/2015  . Somnolence [R40.0] 09/05/2015  . Bipolar depression ([place]) [F31.30] 09/05/2015  . Acute bilateral deep vein thrombosis (DVT) of femoral veins (HCC) [I82.413] 09/05/2015  . Bilateral pulmonary embolism ([place]) [I26.99] 09/02/2015  . Labile hypertension [I10]   . Pulmonary embolism with acute cor pulmonale ([place]) [I26.09]   . Dyspnea [R06.00]   .  Orthostatic hypotension [I95.1]   . Bipolar disorder, in partial remission, most recent episode depressed ([place]) [F31.75]   . Syncope, near [R55] 08/31/2015  . Affective psychosis, bipolar ([place]) [F31.9]   . Bipolar disorder, now depressed ([place]) [F31.30] 08/28/2015  . Bipolar I disorder, severe, current or most recent episode depressed, with psychotic features ([place]) [F31.5]   . Pressure ulcer [L89.90] 07/30/2015  . Protein-calorie malnutrition, severe ([place]) [E43] 07/30/2015  . Dehydration [E86.0] 07/29/2015  . Bipolar disorder, current episode depressed, severe, with psychotic features ([place]) [F31.5] 07/09/2015  . Diabetes ([place]) [E11.9] 07/09/2015  . UTI (urinary tract infection) [N39.0] 06/19/2014  . Positive blood culture [R78.81] 06/19/2014  . Bipolar affective disorder, current episode manic with psychotic symptoms ([place]) [F31.2] 06/17/2014  . Type II or unspecified type diabetes mellitus with unspecified complication, uncontrolled [E11.8, E11.65] 06/02/2014  . Other and unspecified hyperlipidemia [E78.5] 06/02/2014  . OSA on CPAP [G47.33] 06/02/2014  . 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 Gout [M10.9] 06/02/2014  . Hypertension [I10]    Total Time spent with patient: 20 minutes  Past Psychiatric History: Depression.   Past Medical History:  Past Medical History  Diagnosis Date  . Mood swings ([place])   . Type II or unspecified type diabetes mellitus with unspecified complication, uncontrolled 06/02/2014  . Other and unspecified hyperlipidemia 06/02/2014  . OSA on CPAP 06/02/2014  . 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 Bipolar disorder ([place])   . Hypertension   . Gout   . Bipolar 1 disorder ([place])   .  Hx of blood clots   . Chronic kidney disease    History reviewed. No pertinent past surgical history. Family History:  Family History  Problem Relation Age of Onset  . Dementia Mother   .  Arthritis Father   . Hypertension Father   . Heart failure Neg Hx   . Kidney failure Neg Hx   . Cancer Neg Hx   . Mental illness Sister   . Diabetes Sister    Family Psychiatric  History: Bipolar disorder.al History:  History  Alcohol Use No     History  Drug Use No    Social History   Social History  . Marital Status: Married    Spouse Name: N/A  . Number of Children: N/A  . Years of Education: N/A   Social History Main Topics  . Smoking status: Never Smoker   . Smokeless tobacco: None  . Alcohol Use: No  . Drug Use: No  . Sexual Activity: Not Currently   Other Topics Concern  . None   Social History Narrative   *The patient has been married for over 30 years and is currently on disability. He lives with his wife in the [place] area and has one daughter.             Additional Social History:                         Sleep: Fair  Appetite:  Fair  Current Medications: Current Facility-Administered Medications  Medication Dose Route Frequency Provider Last Rate Last Dose  . acetaminophen (TYLENOL) tablet 650 mg  650 mg Oral Q6H PRN Gonzella Lex, MD   650 mg at 01/26/16 2137  . allopurinol (ZYLOPRIM) tablet 100 mg  100 mg Oral Daily Loletha Grayer, MD   100 mg at 02/01/16 0829  . alum & mag hydroxide-simeth (MAALOX/MYLANTA) 200-200-20 MG/5ML suspension 30 mL  30 mL Oral Q4H PRN Gonzella Lex, MD      . apixaban (ELIQUIS) tablet 5 mg  5 mg Oral BID Gonzella Lex, MD   5 mg at 02/01/16 3557  . aspirin EC tablet 81 mg  81 mg Oral Daily Gonzella Lex, MD   81 mg at 02/01/16 0831  . atorvastatin (LIPITOR) tablet 10 mg  10 mg Oral q1800 Gonzella Lex, MD   10 mg at 01/31/16 1701  . clonazePAM (KLONOPIN) tablet 0.5 mg  0.5 mg Oral TID Gonzella Lex, MD   0.5 mg at 02/01/16 0830  . dextrose 5 % solution   Intravenous Continuous Gonzella Lex, MD 10 mL/hr at 01/12/16 1130    . docusate sodium (COLACE) capsule 100 mg  100 mg Oral BID Gonzella Lex, MD    100 mg at 02/01/16 3220  . finasteride (PROSCAR) tablet 5 mg  5 mg Oral Daily Gonzella Lex, MD   5 mg at 02/01/16 2542  . furosemide (LASIX) tablet 20 mg  20 mg Oral Daily Lytle Butte, MD   20 mg at 02/01/16 7062  . insulin aspart (novoLOG) injection 0-9 Units  0-9 Units Subcutaneous TID WC Chauncey Mann, MD   1 Units at 02/01/16 1147  . lisinopril (PRINIVIL,ZESTRIL) tablet 20 mg  20 mg Oral Daily Gonzella Lex, MD   20 mg at 02/01/16 0831  . magnesium hydroxide (MILK OF MAGNESIA) suspension 30 mL  30 mL Oral Daily PRN Gonzella Lex, MD   30 mL at  01/08/16 2315  . metFORMIN (GLUCOPHAGE-XR) 24 hr tablet 500 mg  500 mg Oral BID WC Loletha Grayer, MD   500 mg at 02/01/16 0831  . metoprolol tartrate (LOPRESSOR) tablet 12.5 mg  12.5 mg Oral BID Loletha Grayer, MD   12.5 mg at 02/01/16 3532  . mirtazapine (REMERON) tablet 45 mg  45 mg Oral QHS Gonzella Lex, MD   45 mg at 01/31/16 2158  . multivitamin with minerals tablet 1 tablet  1 tablet Oral Daily Hildred Priest, MD   1 tablet at 02/01/16 0831  . ondansetron (ZOFRAN-ODT) disintegrating tablet 4 mg  4 mg Oral Q8H PRN Gonzella Lex, MD   4 mg at 01/09/16 1340  . ondansetron (ZOFRAN-ODT) disintegrating tablet 4 mg  4 mg Oral Q8H PRN Gonzella Lex, MD   4 mg at 01/16/16 1739  . pantoprazole (PROTONIX) EC tablet 40 mg  40 mg Oral Daily Gonzella Lex, MD   40 mg at 02/01/16 0830  . polyethylene glycol (MIRALAX / GLYCOLAX) packet 17 g  17 g Oral Daily Gonzella Lex, MD   17 g at 02/01/16 9924  . QUEtiapine (SEROQUEL) tablet 400 mg  400 mg Oral QHS Gonzella Lex, MD   400 mg at 01/31/16 2159  . tamsulosin (FLOMAX) capsule 0.4 mg  0.4 mg Oral QPC supper Gonzella Lex, MD   0.4 mg at 01/31/16 1702    Lab Results:  Results for orders placed or performed during the hospital encounter of 12/22/15 (from the past 48 hour(s))  Glucose, capillary     Status: Abnormal   Collection Time: 01/30/16  4:51 PM  Result Value Ref Range    Glucose-Capillary 134 (H) 65 - 99 mg/dL  Glucose, capillary     Status: Abnormal   Collection Time: 01/30/16  8:40 PM  Result Value Ref Range   Glucose-Capillary 178 (H) 65 - 99 mg/dL   Comment 1 Notify RN    Comment 2 Document in Chart   Glucose, capillary     Status: Abnormal   Collection Time: 01/31/16  6:40 AM  Result Value Ref Range   Glucose-Capillary 108 (H) 65 - 99 mg/dL   Comment 1 Notify RN    Comment 2 Document in Chart   Creatinine, serum     Status: None   Collection Time: 01/31/16  6:44 AM  Result Value Ref Range   Creatinine, Ser 1.22 0.61 - 1.24 mg/dL   GFR calc non Af Amer >60 >60 mL/min   GFR calc Af Amer >60 >60 mL/min    Comment: (NOTE) The eGFR has been calculated using the CKD EPI equation. This calculation has not been validated in all clinical situations. eGFR's persistently <60 mL/min signify possible Chronic Kidney Disease.   Glucose, capillary     Status: Abnormal   Collection Time: 01/31/16 12:10 PM  Result Value Ref Range   Glucose-Capillary 125 (H) 65 - 99 mg/dL  Glucose, capillary     Status: Abnormal   Collection Time: 01/31/16  4:44 PM  Result Value Ref Range   Glucose-Capillary 118 (H) 65 - 99 mg/dL   Comment 1 Notify RN   Glucose, capillary     Status: Abnormal   Collection Time: 01/31/16  9:55 PM  Result Value Ref Range   Glucose-Capillary 128 (H) 65 - 99 mg/dL   Comment 1 Notify RN   Glucose, capillary     Status: None   Collection Time: 02/01/16  6:52 AM  Result  Value Ref Range   Glucose-Capillary 92 65 - 99 mg/dL   Comment 1 Notify RN   Glucose, capillary     Status: Abnormal   Collection Time: 02/01/16 11:15 AM  Result Value Ref Range   Glucose-Capillary 146 (H) 65 - 99 mg/dL    Blood Alcohol level:  Lab Results  Component Value Date   ETH <5 12/21/2015   ETH <5 07/09/2015    Physical Findings: AIMS: Facial and Oral Movements Muscles of Facial Expression: None, normal Lips and Perioral Area: None, normal Jaw: None,  normal Tongue: None, normal,Extremity Movements Upper (arms, wrists, hands, fingers): None, normal Lower (legs, knees, ankles, toes): None, normal, Trunk Movements Neck, shoulders, hips: None, normal, Overall Severity Severity of abnormal movements (highest score from questions above): None, normal Incapacitation due to abnormal movements: None, normal Patient's awareness of abnormal movements (rate only patient's report): No Awareness, Dental Status Current problems with teeth and/or dentures?: No Does patient usually wear dentures?: No  CIWA:    COWS:     Musculoskeletal: Strength & Muscle Tone: decreased Gait & Station: unsteady Patient leans: N/A  Psychiatric Specialty Exam: Review of Systems  Constitutional: Positive for malaise/fatigue.  Musculoskeletal: Positive for falls.  Neurological: Positive for weakness.  Psychiatric/Behavioral: Positive for depression and suicidal ideas.  All other systems reviewed and are negative.   Blood pressure 126/75, pulse 83, temperature 98.2 F (36.8 C), temperature source Oral, resp. rate 18, height 6' (1.829 m), weight 227 lb (102.967 kg), SpO2 100 %.Body mass index is 30.78 kg/(m^2).  General Appearance: Casual  Eye Contact::  Fair  Speech:  Clear and Coherent  Volume:  Decreased  Mood:  Depressed  Affect:  Blunt  Thought Process:  Goal Directed  Orientation:  Full (Time, Place, and Person)  Thought Content:  WDL  Suicidal Thoughts:  Yes.  with intent/plan  Homicidal Thoughts:  No  Memory:  Immediate;   Fair Recent;   Fair Remote;   Fair  Judgement:  Impaired  Insight:  Shallow  Psychomotor Activity:  Decreased  Concentration:  Fair  Recall:  [place]: Fair  Akathisia:  No  Handed:  Right  AIMS (if indicated):     Assets:  Communication Skills Desire for Improvement Financial Resources/Insurance Resilience Social Support  ADL's:  Intact  Cognition: WNL  Sleep:  Number of Hours: 3.5    Treatment Plan Summary: Daily contact with patient to assess and evaluate symptoms and progress in treatment and Medication management   [name] has a history of severe, treament resistant depression awaiting transfer to [facility].  1. Suicidal ideation. E is too despondent to attmept. He denies any suicidal thoughts but I'm not sure how well we can trust that. He knows the consequences.  2. Mood. We continue remeron and Seroquel.  3. Diabetes. We continue metformin, ADA diet, SSI nd blood glucose monitoring  4. H/O DVT. He is on Eloquis.  5. S/P fall Surgery help with stiches is greatly appreciated.   6. 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 Resolved/stable restart lisinopril Lasix  7. Essential hypertension- continue metoprolol.  8. BPH. Continue finasteride and Flomax.  9. Weakness. Physical therapy evaluation appreciated.  10. Disposition. Transfer to [facility] when bed available. Discussed this with nursing staff today about his meds and placement.  Encouraged him to continue with his physical therapy    .Rainey Pines, MD 02/01/2016, 3:16 PM

## 2016-02-01 NOTE — Plan of Care (Signed)
Problem: Ineffective individual coping Goal: STG-Increase in ability to manage activities of daily living Outcome: Progressing Patient showered tonight which is the first time in weeks.

## 2016-02-01 NOTE — Progress Notes (Signed)
Pt has been pleasant and cooperative. Pt's mood and affect appears less depressed. Pt has been more active on the unit  Pt completed his self inventory. He rated his depression a 6/10, his hopelessness a 5/10 and his anxiety 5/10. Pt has attended some  unit activities. Pt moving about the unit via W/C. Pt denies SI and A/V hallucinations.

## 2016-02-01 NOTE — Progress Notes (Signed)
D:Patient appears much brighter today and has been interacting in the milieu. He got up and watched the game with the other patients. After that he got a shower which staff have been trying to get him to do for weeks. He rates his depression at a 6. He denies any pain. He denies SI/HI/AVH. He's been very pleasant tonight.  A: Medication was given with education. Encouragement was provided. Bandage was replaced over stiches.  R: Patient was compliant with medication but he refused his colace. He has remained calm and cooperative. Safety maintained with 15 min checks.

## 2016-02-02 LAB — CREATININE, SERUM
Creatinine, Ser: 1.18 mg/dL (ref 0.61–1.24)
GFR calc Af Amer: >60 mL/min
GFR calc non Af Amer: >60 mL/min

## 2016-02-02 LAB — CBC
HCT: 34.6 % — ABNORMAL LOW (ref 40.0–52.0)
Hemoglobin: 11.7 g/dL — ABNORMAL LOW (ref 13.0–18.0)
MCH: 29.4 pg (ref 26.0–34.0)
MCHC: 33.9 g/dL (ref 32.0–36.0)
MCV: 86.8 fL (ref 80.0–100.0)
Platelets: 140 10*3/uL — ABNORMAL LOW (ref 150–440)
RBC: 3.99 MIL/uL — ABNORMAL LOW (ref 4.40–5.90)
RDW: 14.4 % (ref 11.5–14.5)
WBC: 3.5 10*3/uL — ABNORMAL LOW (ref 3.8–10.6)

## 2016-02-02 LAB — GLUCOSE, CAPILLARY
Glucose-Capillary: 108 mg/dL — ABNORMAL HIGH (ref 65–99)
Glucose-Capillary: 172 mg/dL — ABNORMAL HIGH (ref 65–99)
Glucose-Capillary: 156 mg/dL — ABNORMAL HIGH (ref 65–99)
Glucose-Capillary: 116 mg/dL — ABNORMAL HIGH (ref 65–99)

## 2016-02-02 NOTE — Progress Notes (Signed)
[facility] MD Progress Note  02/02/2016 6:45 PM Christian [name]  MRN:  [id number]  Subjective: Follow-up as of Monday the sixth. Patient is looking more energetic and more alert and interactive. May be responding to the hope of getting discharge. He is eating adequately. Nursing took the sutures out of his for head today and that seems to of been tolerated well. No indication to change any medicine today continue to follow-up and discuss with social work tomorrow. Nursing staff keeping an eye on him and helping with ADL. He is complaint with meds. Social work is still looking for placement and has a possible lead on a family care home.   Principal Problem: Bipolar disorder, current episode depressed, severe, with psychotic features ([place]) Diagnosis:   Patient Active Problem List   Diagnosis Date Noted  . Fall [W19.XXXA]   . Laceration of eyebrow [S01.119A]   . Bipolar I disorder, most recent episode depressed, severe w psychosis ([place]) [F31.5] 12/22/2015  . Bipolar affective disorder, depressed, severe, with psychotic behavior ([place]) [F31.5]   . Bipolar I disorder, current or most recent episode depressed, with psychotic features ([place]) [F31.5]   . Bipolar I disorder, most recent episode depressed ([place]) [F31.30]   . Severe bipolar I disorder with depression ([place]) [F31.4]   . Bipolar I disorder, most recent episode depressed, severe with psychotic features ([place]) [F31.5]   . Overdose [T50.901A] 09/11/2015  . Acute respiratory failure with hypoxia ([place]) [J96.01] 09/05/2015  . Elevated troponin [R79.89] 09/05/2015  . Somnolence [R40.0] 09/05/2015  . Bipolar depression ([place]) [F31.30] 09/05/2015  . Acute bilateral deep vein thrombosis (DVT) of femoral veins (HCC) [I82.413] 09/05/2015  . Bilateral pulmonary embolism ([place]) [I26.99] 09/02/2015  . Labile hypertension [I10]   . Pulmonary embolism with acute cor pulmonale ([place]) [I26.09]   . Dyspnea [R06.00]   . Orthostatic hypotension [I95.1]   . Bipolar  disorder, in partial remission, most recent episode depressed ([place]) [F31.75]   . Syncope, near [R55] 08/31/2015  . Affective psychosis, bipolar ([place]) [F31.9]   . Bipolar disorder, now depressed ([place]) [F31.30] 08/28/2015  . Bipolar I disorder, severe, current or most recent episode depressed, with psychotic features ([place]) [F31.5]   . Pressure ulcer [L89.90] 07/30/2015  . Protein-calorie malnutrition, severe ([place]) [E43] 07/30/2015  . Dehydration [E86.0] 07/29/2015  . Bipolar disorder, current episode depressed, severe, with psychotic features ([place]) [F31.5] 07/09/2015  . Diabetes ([place]) [E11.9] 07/09/2015  . UTI (urinary tract infection) [N39.0] 06/19/2014  . Positive blood culture [R78.81] 06/19/2014  . Bipolar affective disorder, current episode manic with psychotic symptoms ([place]) [F31.2] 06/17/2014  . Type II or unspecified type diabetes mellitus with unspecified complication, uncontrolled [E11.8, E11.65] 06/02/2014  . Other and unspecified hyperlipidemia [E78.5] 06/02/2014  . OSA on CPAP [G47.33] 06/02/2014  . 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 Gout [M10.9] 06/02/2014  . Hypertension [I10]    Total Time spent with patient: 20 minutes  Past Psychiatric History: Depression.   Past Medical History:  Past Medical History  Diagnosis Date  . Mood swings ([place])   . Type II or unspecified type diabetes mellitus with unspecified complication, uncontrolled 06/02/2014  . Other and unspecified hyperlipidemia 06/02/2014  . OSA on CPAP 06/02/2014  . 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 Bipolar disorder ([place])   . Hypertension   . Gout   . Bipolar 1 disorder ([place])   . Hx of blood clots   . Chronic kidney disease  History reviewed. No pertinent past surgical history. Family History:  Family History  Problem Relation Age of Onset  . Dementia Mother   . Arthritis Father   . Hypertension Father   .  Heart failure Neg Hx   . Kidney failure Neg Hx   . Cancer Neg Hx   . Mental illness Sister   . Diabetes Sister    Family Psychiatric  History: Bipolar disorder.al History:  History  Alcohol Use No     History  Drug Use No    Social History   Social History  . Marital Status: Married    Spouse Name: N/A  . Number of Children: N/A  . Years of Education: N/A   Social History Main Topics  . Smoking status: Never Smoker   . Smokeless tobacco: None  . Alcohol Use: No  . Drug Use: No  . Sexual Activity: Not Currently   Other Topics Concern  . None   Social History Narrative   *The patient has been married for over 30 years and is currently on disability. He lives with his wife in the [place] area and has one daughter.             Additional Social History:                         Sleep: Fair  Appetite:  Fair  Current Medications: Current Facility-Administered Medications  Medication Dose Route Frequency Provider Last Rate Last Dose  . acetaminophen (TYLENOL) tablet 650 mg  650 mg Oral Q6H PRN Gonzella Lex, MD   650 mg at 01/26/16 2137  . allopurinol (ZYLOPRIM) tablet 100 mg  100 mg Oral Daily Loletha Grayer, MD   100 mg at 02/02/16 0900  . alum & mag hydroxide-simeth (MAALOX/MYLANTA) 200-200-20 MG/5ML suspension 30 mL  30 mL Oral Q4H PRN Gonzella Lex, MD      . apixaban (ELIQUIS) tablet 5 mg  5 mg Oral BID Gonzella Lex, MD   5 mg at 02/02/16 0900  . aspirin EC tablet 81 mg  81 mg Oral Daily Gonzella Lex, MD   81 mg at 02/02/16 0900  . atorvastatin (LIPITOR) tablet 10 mg  10 mg Oral q1800 Gonzella Lex, MD   10 mg at 02/02/16 1636  . clonazePAM (KLONOPIN) tablet 0.5 mg  0.5 mg Oral TID Gonzella Lex, MD   0.5 mg at 02/02/16 1636  . dextrose 5 % solution   Intravenous Continuous Gonzella Lex, MD 10 mL/hr at 01/12/16 1130    . docusate sodium (COLACE) capsule 100 mg  100 mg Oral BID Gonzella Lex, MD   100 mg at 02/01/16 3762  . finasteride  (PROSCAR) tablet 5 mg  5 mg Oral Daily Gonzella Lex, MD   5 mg at 02/02/16 0900  . furosemide (LASIX) tablet 20 mg  20 mg Oral Daily Lytle Butte, MD   20 mg at 02/02/16 0900  . insulin aspart (novoLOG) injection 0-9 Units  0-9 Units Subcutaneous TID WC Chauncey Mann, MD   2 Units at 02/02/16 1637  . lisinopril (PRINIVIL,ZESTRIL) tablet 20 mg  20 mg Oral Daily Gonzella Lex, MD   20 mg at 02/02/16 0900  . magnesium hydroxide (MILK OF MAGNESIA) suspension 30 mL  30 mL Oral Daily PRN Gonzella Lex, MD   30 mL at 01/08/16 2315  . metFORMIN (GLUCOPHAGE-XR) 24 hr tablet 500 mg  500  mg Oral BID WC Loletha Grayer, MD   500 mg at 02/02/16 1636  . metoprolol tartrate (LOPRESSOR) tablet 12.5 mg  12.5 mg Oral BID Loletha Grayer, MD   12.5 mg at 02/02/16 0859  . mirtazapine (REMERON) tablet 45 mg  45 mg Oral QHS Gonzella Lex, MD   45 mg at 02/01/16 2249  . multivitamin with minerals tablet 1 tablet  1 tablet Oral Daily Hildred Priest, MD   1 tablet at 02/02/16 0859  . ondansetron (ZOFRAN-ODT) disintegrating tablet 4 mg  4 mg Oral Q8H PRN Gonzella Lex, MD   4 mg at 01/09/16 1340  . ondansetron (ZOFRAN-ODT) disintegrating tablet 4 mg  4 mg Oral Q8H PRN Gonzella Lex, MD   4 mg at 01/16/16 1739  . pantoprazole (PROTONIX) EC tablet 40 mg  40 mg Oral Daily Gonzella Lex, MD   40 mg at 02/02/16 0900  . polyethylene glycol (MIRALAX / GLYCOLAX) packet 17 g  17 g Oral Daily Gonzella Lex, MD   17 g at 02/01/16 1245  . QUEtiapine (SEROQUEL) tablet 400 mg  400 mg Oral QHS Gonzella Lex, MD   400 mg at 02/01/16 2249  . tamsulosin (FLOMAX) capsule 0.4 mg  0.4 mg Oral QPC supper Gonzella Lex, MD   0.4 mg at 02/02/16 1636    Lab Results:  Results for orders placed or performed during the hospital encounter of 12/22/15 (from the past 48 hour(s))  Glucose, capillary     Status: Abnormal   Collection Time: 01/31/16  9:55 PM  Result Value Ref Range   Glucose-Capillary 128 (H) 65 - 99 mg/dL    Comment 1 Notify RN   Glucose, capillary     Status: None   Collection Time: 02/01/16  6:52 AM  Result Value Ref Range   Glucose-Capillary 92 65 - 99 mg/dL   Comment 1 Notify RN   Glucose, capillary     Status: Abnormal   Collection Time: 02/01/16 11:15 AM  Result Value Ref Range   Glucose-Capillary 146 (H) 65 - 99 mg/dL  Glucose, capillary     Status: None   Collection Time: 02/01/16  4:56 PM  Result Value Ref Range   Glucose-Capillary 93 65 - 99 mg/dL  Glucose, capillary     Status: Abnormal   Collection Time: 02/01/16 10:46 PM  Result Value Ref Range   Glucose-Capillary 157 (H) 65 - 99 mg/dL  Creatinine, serum     Status: None   Collection Time: 02/02/16  6:51 AM  Result Value Ref Range   Creatinine, Ser 1.18 0.61 - 1.24 mg/dL   GFR calc non Af Amer >60 >60 mL/min   GFR calc Af Amer >60 >60 mL/min    Comment: (NOTE) The eGFR has been calculated using the CKD EPI equation. This calculation has not been validated in all clinical situations. eGFR's persistently <60 mL/min signify possible Chronic Kidney Disease.   CBC     Status: Abnormal   Collection Time: 02/02/16  6:51 AM  Result Value Ref Range   WBC 3.5 (L) 3.8 - 10.6 K/uL   RBC 3.99 (L) 4.40 - 5.90 MIL/uL   Hemoglobin 11.7 (L) 13.0 - 18.0 g/dL   HCT 34.6 (L) 40.0 - 52.0 %   MCV 86.8 80.0 - 100.0 fL   MCH 29.4 26.0 - 34.0 pg   MCHC 33.9 32.0 - 36.0 g/dL   RDW 14.4 11.5 - 14.5 %   Platelets 140 (L) 150 -  440 K/uL  Glucose, capillary     Status: Abnormal   Collection Time: 02/02/16  7:02 AM  Result Value Ref Range   Glucose-Capillary 108 (H) 65 - 99 mg/dL  Glucose, capillary     Status: Abnormal   Collection Time: 02/02/16 12:06 PM  Result Value Ref Range   Glucose-Capillary 172 (H) 65 - 99 mg/dL   Comment 1 Notify RN   Glucose, capillary     Status: Abnormal   Collection Time: 02/02/16  4:33 PM  Result Value Ref Range   Glucose-Capillary 156 (H) 65 - 99 mg/dL   Comment 1 Notify RN     Blood Alcohol level:   Lab Results  Component Value Date   ETH <5 12/21/2015   ETH <5 07/09/2015    Physical Findings: AIMS: Facial and Oral Movements Muscles of Facial Expression: None, normal Lips and Perioral Area: None, normal Jaw: None, normal Tongue: None, normal,Extremity Movements Upper (arms, wrists, hands, fingers): None, normal Lower (legs, knees, ankles, toes): None, normal, Trunk Movements Neck, shoulders, hips: None, normal, Overall Severity Severity of abnormal movements (highest score from questions above): None, normal Incapacitation due to abnormal movements: None, normal Patient's awareness of abnormal movements (rate only patient's report): No Awareness, Dental Status Current problems with teeth and/or dentures?: No Does patient usually wear dentures?: No  CIWA:    COWS:     Musculoskeletal: Strength & Muscle Tone: decreased Gait & Station: unsteady Patient leans: N/A  Psychiatric Specialty Exam: Review of Systems  Constitutional: Positive for malaise/fatigue.  Musculoskeletal: Positive for falls.  Neurological: Positive for weakness.  Psychiatric/Behavioral: Positive for depression and suicidal ideas.  All other systems reviewed and are negative.   Blood pressure 126/88, pulse 110, temperature 98.1 F (36.7 C), temperature source Oral, resp. rate 18, height 6' (1.829 m), weight 102.967 kg (227 lb), SpO2 100 %.Body mass index is 30.78 kg/(m^2).  General Appearance: Casual  Eye Contact::  Fair  Speech:  Clear and Coherent  Volume:  Decreased  Mood:  Depressed  Affect:  Blunt  Thought Process:  Goal Directed  Orientation:  Full (Time, Place, and Person)  Thought Content:  WDL  Suicidal Thoughts:  Yes.  with intent/plan  Homicidal Thoughts:  No  Memory:  Immediate;   Fair Recent;   Fair Remote;   Fair  Judgement:  Impaired  Insight:  Shallow  Psychomotor Activity:  Decreased  Concentration:  Fair  Recall:  [place]: Fair  Akathisia:   No  Handed:  Right  AIMS (if indicated):     Assets:  Communication Skills Desire for Improvement Financial Resources/Insurance Resilience Social Support  ADL's:  Intact  Cognition: WNL  Sleep:  Number of Hours: 6.25   Treatment Plan Summary: Daily contact with patient to assess and evaluate symptoms and progress in treatment and Medication management   Christian [name] has a history of severe, treament resistant depression awaiting transfer to [facility].  1. Suicidal ideation. E is too despondent to attmept. He denies any suicidal thoughts but I'm not sure how well we can trust that. He knows the consequences.  2. Mood. We continue remeron and Seroquel.  3. Diabetes. We continue metformin, ADA diet, SSI nd blood glucose monitoring  4. H/O DVT. He is on Eloquis.  5. S/P fall Surgery help with stiches is greatly appreciated.   6. 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 Resolved/stable restart lisinopril Lasix  7. Essential hypertension- continue metoprolol.  8. BPH.  Continue finasteride and Flomax.  9. Weakness. Physical therapy evaluation appreciated.  10. Disposition. Transfer to [facility] when bed available. Discussed this with nursing staff today about his meds and placement.  Encouraged him to continue with his physical therapy    .Alethia Berthold, MD 02/02/2016, 6:45 PM

## 2016-02-02 NOTE — Progress Notes (Signed)
NUTRITION NOTE:   Christian [name] is a 65 y.o. male with Bipolar disorder, current episode depressed, severe, with psychotic features (HCC)  PMH:  Past Medical History  Diagnosis Date  . Mood swings (HCC)   . Type II or unspecified type diabetes mellitus with unspecified complication, uncontrolled 06/02/2014  . Other and unspecified hyperlipidemia 06/02/2014  . OSA on CPAP 06/02/2014  . 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 Bipolar disorder (HCC)   . Hypertension   . Gout   . Bipolar 1 disorder (HCC)   . Hx of blood clots   . Chronic kidney disease     Diet Order: regular  Current Nutrition: eating mostly 95-100% of meals and tolerating well  Anthropometrics:  Body mass index is 30.78 kg/(m^2).   Pt is at no nutrition risk due to diagnosis/current problem of depression, BMI of 30.9, regular diet order, 95-100% po intake. No nutrition intervention warranted at this time. Will sign off. Please re-consult RD if nutritional issues arise.   Rustyn Conery B. Freida Busman, RD, LDN [phone] (pager) Weekend/On-Call pager [phone])

## 2016-02-02 NOTE — [facility] Group Notes (Signed)
[facility] LCSW Group Therapy   02/02/2016 1 pm Type of Therapy: Group Therapy   Participation Level: Active   Participation Quality: Attentive, Sharing and Supportive   Affect: Depressed and Flat   Cognitive: Alert and Oriented   Insight: Developing/Improving and Engaged   Engagement in Therapy: Developing/Improving and Engaged   Modes of Intervention: Clarification, Confrontation, Discussion, Education, Exploration,  Limit-setting, Orientation, Problem-solving, Rapport Building, Dance movement psychotherapist, Socialization and Support   Summary of Progress/Problems: Pt identified obstacles faced currently and processed barriers involved in overcoming these obstacles. Pt identified steps necessary for overcoming these obstacles and explored motivation (internal and external) for facing these difficulties head on. Pt further identified one area of concern in their lives and chose a goal to focus on for today.  Pt shared during group when prompted by the clinician about his plans to use self-care to overcome his obstacles. Pt actively listened to and showed interest in the sharing of others, as evidenced by verbal affirmations during group sharing.  Pt shared that self-care was something he does not practice enough and shared that he looked forward to minimizing his stressors, stress levels and thus, his adverse reactions to stress which cause him to be sick..  Pt shared he is focused on his goal of getting better and being discharged.   [name]. Infant Zink, LCSWA, LCAS  02/02/16

## 2016-02-02 NOTE — Progress Notes (Signed)
PT Cancellation Note  Patient Details Name: Christian [name] MRN: [id number] DOB: 1951/09/16   Cancelled Treatment:    Reason Eval/Treat Not Completed: Patient declined, no reason specified.  Pt sleeping upon PT's arrival and woke up to PT's voice.  Pt refusing PT today d/t stating "I don't feel like it".  Pt educated on the importance of participating in PT but pt continued to decline.  Spent time trying to encourage pt to participate but pt continued to refuse.  Will re-attempt PT at a later date/time.    Irving Burton Mazin Emma 02/02/2016, 11:08 AM Hendricks Limes, PT [phone]

## 2016-02-02 NOTE — Plan of Care (Signed)
Problem: Alteration in mood Goal: LTG-Patient reports reduction in suicidal thoughts (Patient reports reduction in suicidal thoughts and is able to verbalize a safety plan for whenever patient is feeling suicidal)  Outcome: Progressing Patient denies SI.      

## 2016-02-02 NOTE — Progress Notes (Signed)
D: Observed pt active in dayroom. Patient alert and oriented x4. Patient denies SI/HI/AVH. Pt affect is depressed. Pt rated depression 5/10 and anxiety 5/10. Pt feels his mood is improving. Pt indicated that he went to two groups today. Pt was more talkative with Clinical research associate about his family tonight. Observed pt smiling and joking at times. Pt much less isolative to room this evening.  A: Offered active listening and support. Provided therapeutic communication. Administered scheduled medications. Encouraged pt to continue attending groups and to actively participate in care. R: Pt pleasant and cooperative. Pt medication compliant. Will continue Q15 min. checks. Safety maintained.

## 2016-02-02 NOTE — Progress Notes (Signed)
D:  Per pt self inventory pt reports sleeping poor, appetite good, energy level low, ability to pay attention poor, rates depression at a 5 out of 10, hopelessness at a 5 out of 10, anxiety at a 5 out of 10, denies SI/HI/AVH, goal today: "stay on point, follow directions", flat/depressed, complaining of nightmares last night--MD notified.     A:  Emotional support provided, Encouraged pt to continue with treatment plan and attend all group activities, q15 min checks maintained for safety.  R:  Pt went to one group today and was in the dayroom reading paper after meals, pleasant and cooperative with staff and other patients on the unit.

## 2016-02-02 NOTE — [facility] Group Notes (Signed)
[facility] Group Notes:  (Nursing/MHT/Case Management/Adjunct)  Date:  02/02/2016  Time:  1:51 AM  Type of Therapy:  Group Therapy  Participation Level:  Active  Participation Quality:  Appropriate  Affect:  Appropriate  Cognitive:  Appropriate  Insight:  Good and Improving  Engagement in Group:  Engaged  Modes of Intervention:  n/a  Summary of Progress/Problems:  Christian [name] 02/02/2016, 1:51 AM

## 2016-02-02 NOTE — [facility] Group Notes (Signed)
[facility] Group Notes:  (Nursing/MHT/Case Management/Adjunct)  Date:  02/02/2016  Time:  12:24 PM  Type of Therapy:  Group Therapy  Participation Level:  Did Not Attend  Participation Arvin Collard 02/02/2016, 12:24 PM

## 2016-02-03 DIAGNOSIS — F515 Nightmare disorder: Secondary | ICD-10-CM | POA: Diagnosis not present

## 2016-02-03 DIAGNOSIS — F315 Bipolar disorder, current episode depressed, severe, with psychotic features: Secondary | ICD-10-CM | POA: Diagnosis not present

## 2016-02-03 DIAGNOSIS — Q272 Other congenital malformations of renal artery: Secondary | ICD-10-CM | POA: Diagnosis not present

## 2016-02-03 DIAGNOSIS — I501 Left ventricular failure: Secondary | ICD-10-CM | POA: Diagnosis not present

## 2016-02-03 DIAGNOSIS — I700 Atherosclerosis of aorta: Secondary | ICD-10-CM | POA: Diagnosis not present

## 2016-02-03 DIAGNOSIS — Z452 Encounter for adjustment and management of vascular access device: Secondary | ICD-10-CM | POA: Diagnosis not present

## 2016-02-03 DIAGNOSIS — I519 Heart disease, unspecified: Secondary | ICD-10-CM | POA: Diagnosis not present

## 2016-02-03 DIAGNOSIS — I340 Nonrheumatic mitral (valve) insufficiency: Secondary | ICD-10-CM | POA: Diagnosis not present

## 2016-02-03 DIAGNOSIS — G4733 Obstructive sleep apnea (adult) (pediatric): Secondary | ICD-10-CM | POA: Diagnosis not present

## 2016-02-03 DIAGNOSIS — F319 Bipolar disorder, unspecified: Secondary | ICD-10-CM | POA: Diagnosis not present

## 2016-02-03 DIAGNOSIS — Q251 Coarctation of aorta: Secondary | ICD-10-CM | POA: Diagnosis not present

## 2016-02-03 DIAGNOSIS — Z915 Personal history of self-harm: Secondary | ICD-10-CM | POA: Diagnosis not present

## 2016-02-03 DIAGNOSIS — N2889 Other specified disorders of kidney and ureter: Secondary | ICD-10-CM | POA: Diagnosis not present

## 2016-02-03 DIAGNOSIS — R451 Restlessness and agitation: Secondary | ICD-10-CM | POA: Diagnosis not present

## 2016-02-03 DIAGNOSIS — I10 Essential (primary) hypertension: Secondary | ICD-10-CM | POA: Diagnosis not present

## 2016-02-03 DIAGNOSIS — I517 Cardiomegaly: Secondary | ICD-10-CM | POA: Diagnosis not present

## 2016-02-03 DIAGNOSIS — I701 Atherosclerosis of renal artery: Secondary | ICD-10-CM | POA: Diagnosis not present

## 2016-02-03 LAB — GLUCOSE, CAPILLARY
Glucose-Capillary: 87 mg/dL (ref 65–99)
Glucose-Capillary: 153 mg/dL — ABNORMAL HIGH (ref 65–99)

## 2016-02-03 LAB — CREATININE, SERUM
Creatinine, Ser: 1.18 mg/dL (ref 0.61–1.24)
GFR calc Af Amer: >60 mL/min (ref >60)
GFR calc non Af Amer: >60 mL/min (ref >60)

## 2016-02-03 MED ORDER — INSULIN ASPART 100 UNIT/ML ~~LOC~~ SOLN: 0.0000 [IU] | Freq: Three times a day (TID) | SUBCUTANEOUS | Status: DC

## 2016-02-03 MED ORDER — ALLOPURINOL 100 MG PO TABS: 100.0000 mg | ORAL_TABLET | Freq: Every day | ORAL | Status: DC

## 2016-02-03 MED ORDER — LISINOPRIL 20 MG PO TABS: 20.0000 mg | ORAL_TABLET | Freq: Every day | ORAL | Status: DC

## 2016-02-03 MED ORDER — CLONAZEPAM 0.5 MG PO TABS: 0.5000 mg | ORAL_TABLET | Freq: Three times a day (TID) | ORAL | Status: DC

## 2016-02-03 MED ORDER — PANTOPRAZOLE SODIUM 40 MG PO TBEC: 40.0000 mg | DELAYED_RELEASE_TABLET | Freq: Every day | ORAL | Status: DC

## 2016-02-03 MED ORDER — METOPROLOL TARTRATE 25 MG PO TABS: 12.5000 mg | ORAL_TABLET | Freq: Two times a day (BID) | ORAL | Status: DC

## 2016-02-03 MED ORDER — FUROSEMIDE 20 MG PO TABS: 20.0000 mg | ORAL_TABLET | Freq: Every day | ORAL | Status: DC

## 2016-02-03 MED ORDER — QUETIAPINE FUMARATE 400 MG PO TABS: 400.0000 mg | ORAL_TABLET | Freq: Every day | ORAL | Status: DC

## 2016-02-03 NOTE — [facility] Group Notes (Signed)
[facility] Group Notes:  (Nursing/MHT/Case Management/Adjunct)  Date:  02/03/2016  Time:  2:16 PM  Type of Therapy:  Psychoeducational Skills  Participation Level:  Did Not Attend   Lynelle Smoke [facility] 02/03/2016, 2:16 PM

## 2016-02-03 NOTE — Progress Notes (Addendum)
D:Patient aware of discharge this shift . Patient returning home . Patient received all belonging locked up . Patient denies  Suicidal  And homicidal ideations  .  A: Writer instructed on discharge criteria  [facility] . Aware  Of follow up appointment at time of discharge  From their facility. R: Patient left unit with no questions  Or concerns  With sheriff

## 2016-02-03 NOTE — Progress Notes (Signed)
Recreation Therapy Notes  Date: 03.07.17 Time: 3:00 pm Location: Craft Room  Group Topic: Self-expression  Goal Area(s) Addresses:  Patient will identify one color per emotion listed on wheel. Patient will verbalize benefit of using art as a means of self-expression. Patient will verbalize one emotion experienced during session. Patient will be educated on other forms of self-expression.  Behavioral Response: Did not attend  Intervention: Emotion Wheel  Activity: Patients were given an Emotion Wheel worksheet with 7 different emotions and were instructed to pick a color for each emotion.  Education: Patient did not attend group.  Education Outcome: Patient did not attend group.   Clinical Observations/Feedback: Patient did not attend group.  Jacquelynn Cree, LRT/CTRS 02/03/2016 4:07 PM
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
## 2016-02-03 NOTE — B[facility] Suicide Risk Assessment (Signed)
[employer] Discharge Suicide Risk Assessment   Principal Problem: Bipolar disorder, current episode depressed, severe, with psychotic features [facility]) Discharge Diagnoses:  Patient Active Problem List   Diagnosis Date Noted  . Fall [W19.XXXA]   . Laceration of eyebrow [S01.119A]   . Bipolar I disorder, most recent episode depressed, severe w psychosis (HCC) [F31.5] 12/22/2015  . Bipolar affective disorder, depressed, severe, with psychotic behavior (HCC) [F31.5]   . Bipolar I disorder, current or most recent episode depressed, with psychotic features (HCC) [F31.5]   . Bipolar I disorder, most recent episode depressed (HCC) [F31.30]   . Severe bipolar I disorder with depression (HCC) [F31.4]   . Bipolar I disorder, most recent episode depressed, severe with psychotic features (HCC) [F31.5]   . Overdose [T50.901A] 09/11/2015  . Acute respiratory failure with hypoxia (HCC) [J96.01] 09/05/2015  . Elevated troponin [R79.89] 09/05/2015  . Somnolence [R40.0] 09/05/2015  . Bipolar depression (HCC) [F31.30] 09/05/2015  . Acute bilateral deep vein thrombosis (DVT) of femoral veins (HCC) [I82.413] 09/05/2015  . Bilateral pulmonary embolism (HCC) [I26.99] 09/02/2015  . Labile hypertension [I10]   . Pulmonary embolism with acute cor pulmonale (HCC) [I26.09]   . Dyspnea [R06.00]   . Orthostatic hypotension [I95.1]   . Bipolar disorder, in partial remission, most recent episode depressed (HCC) [F31.75]   . Syncope, near [R55] 08/31/2015  . Affective psychosis, bipolar (HCC) [F31.9]   . Bipolar disorder, now depressed (HCC) [F31.30] 08/28/2015  . Bipolar I disorder, severe, current or most recent episode depressed, with psychotic features (HCC) [F31.5]   . Pressure ulcer [L89.90] 07/30/2015  . Protein-calorie malnutrition, severe (HCC) [E43] 07/30/2015  . Dehydration [E86.0] 07/29/2015  . Bipolar disorder, current episode depressed, severe, with psychotic features (HCC) [F31.5] 07/09/2015  . Diabetes (HCC)  [E11.9] 07/09/2015  . UTI (urinary tract infection) [N39.0] 06/19/2014  . Positive blood culture [R78.81] 06/19/2014  . Bipolar affective disorder, current episode manic with psychotic symptoms (HCC) [F31.2] 06/17/2014  . Type II or unspecified type diabetes mellitus with unspecified complication, uncontrolled [E11.8, E11.65] 06/02/2014  . Other and unspecified hyperlipidemia [E78.5] 06/02/2014  . OSA on CPAP [G47.33] 06/02/2014  . 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 Gout [M10.9] 06/02/2014  . Hypertension [I10]     Total Time spent with patient: 35 minutes  Musculoskeletal: Strength & Muscle Tone: decreased Gait & Station: unsteady Patient leans: N/A  Psychiatric Specialty Exam: ROS  Blood pressure 151/96, pulse 93, temperature 97.9 F (36.6 C), temperature source Oral, resp. rate 20, height 6' (1.829 m), weight 102.967 kg (227 lb), SpO2 100 %.Body mass index is 30.78 kg/(m^2).  General Appearance: Casual  Eye Contact::  Fair  Speech:  Slow and Slurred409  Volume:  Decreased  Mood:  Dysphoric  Affect:  Constricted  Thought Process:  Goal Directed  Orientation:  Full (Time, Place, and Person)  Thought Content:  Negative  Suicidal Thoughts:  No  Homicidal Thoughts:  No  Memory:  Immediate;   Good Recent;   Fair Remote;   Fair  Judgement:  Fair  Insight:  Fair  Psychomotor Activity:  Decreased  Concentration:  Fair  Recall:  [employer] of Knowledge:Good  Language: Fair  Akathisia:  No  Handed:  Right  AIMS (if indicated):     Assets:  Communication Skills Desire for Improvement Financial Resources/Insurance Resilience Social Support Talents/Skills  Sleep:  Number of Hours: 6.45  Cognition: Impaired,  Mild  ADL's:  Impaired   Mental Status Per  Nursing Assessment::   On Admission:  Thoughts of violence towards others, Suicidal ideation indicated by others  Demographic Factors:  Male and Patient has a past  history of prior suicide attempts. Also is currently mourning a major loss in his personal life in that he is not able to go back home and stay with his wife.  Loss Factors: Decline in physical health, Financial problems/change in socioeconomic status and Because of his recent behavior wife is uncomfortable with him returning home which is a major reason why we are referring [facility] to [facility]. Patient is aware of this and is very sad about it.  Historical Factors: Prior suicide attempts and Family history of mental illness or substance abuse  Risk Reduction Factors:   Sense of responsibility to family, Living with another person, especially a relative, Positive social support and Positive therapeutic relationship  Continued Clinical Symptoms:  Bipolar Disorder:   Depressive phase  Cognitive Features That Contribute To Risk:  Loss of executive function    Suicide Risk:  Moderate:  Frequent suicidal ideation with limited intensity, and duration, some specificity in terms of plans, no associated intent, good self-control, limited dysphoria/symptomatology, some risk factors present, and identifiable protective factors, including available and accessible social support.  Follow-up Information    Follow up with [facility].   Why:  Please call Dr. Ann Maki McKinney's office upon discharge from [facility] to schedule your hospital follow up for medication management and therapy   Contact information:   [address] [place], [place] [address] Ph [phone] Fax [phone]       Follow up with [facility].   Why:  Please arrive between the hours of 9-5pm to seek admittance for inpatient mental health treatment   Contact information:   [address] [place], [place] [address] Phone: [phone] Fax: [phone]      Plan Of Care/Follow-up recommendations:  Activity:  Increase physical activity as tolerated and in fact he should be encouraged to  be as active as reasonably possible Diet:  Low carbohydrate diet Other:  I recommend that ECT might be at least considered again in the future although he did not have a good response to the most recent index course.  [name], MD 02/03/2016, 11:15 AM

## 2016-02-03 NOTE — BHH Suicide Risk Assessment (Signed)
[facility] INPATIENT:  Family/Significant Other Suicide Prevention Education  Suicide Prevention Education:  Patient Refusal for Family/Significant Other Suicide Prevention Education: The patient Christian [name] has refused to provide written consent for family/significant other to be provided Family/Significant Other Suicide Prevention Education during admission and/or prior to discharge.  Physician notified.  CSW completed SPE with the pt.  [name] 02/03/2016, 3:20 PM
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
## 2016-02-03 NOTE — [facility] Group Notes (Signed)
[facility] LCSW Group Therapy   02/03/2016 1pm  Type of Therapy: Group Therapy   Participation Level: Did Not Attend. Patient invited to participate but declined.    Christian [name], MSW, LCSWA, LCAS   

## 2016-02-03 NOTE — Progress Notes (Signed)
LOC improving, able to transfer to wheel chair with minimal assist for snacks; medicated at the bedside, declined 200 mg of colace, reminded to prompt for help prior to getting out of bed. Patient, at 0300 am, requested that he would like to shave beards, agreed to wait until morning shift. Will continue to monitor for safety.

## 2016-02-03 NOTE — Plan of Care (Signed)
Problem: Ineffective individual coping Goal: STG: Patient will remain free from self harm Outcome: Progressing 15 minute checks maintained for safety, clinical and moral support provided, patient encouraged to continue to express feelings and demonstrate safe care. Patient remain free from harm, will continue to monitor.          

## 2016-02-03 NOTE — [facility] Group Notes (Signed)
[facility] LCSW Aftercare Discharge Planning Group Note  02/02/2016 9:15 AM  Participation Quality: Did Not Attend. Patient invited to participate but declined.   Christian [name] [name], MSW, LCSWA, LCAS   

## 2016-02-03 NOTE — Progress Notes (Signed)
  [facility] Adult Case Management Discharge Plan :  Will you be returning to the same living situation after discharge:  No. Pt will be admitted into [facility] At discharge, do you have transportation home?: Yes,  pt will be transported by [facility] Deputies Do you have the ability to pay for your medications: Yes,  pt will be provided with prescriptions at discharge  Release of information consent forms completed and in the chart;  Patient's signature needed at discharge.  Patient to Follow up at: Follow-up Information    Follow up with [facility].   Why:  Please call Dr. Ann Maki McKinney's office upon discharge from [facility] to schedule your hospital follow up for medication management and therapy   Contact information:   [address] [place], [place] [address] Ph [phone] Fax [phone]       Follow up with [facility].   Why:  Please arrive between the hours of 9-5pm to seek admittance for inpatient mental health treatment   Contact information:   [address] [place], [place] [address] Phone: [phone] Fax: [phone]      Next level of care provider has access to [facility] Link:yes  Safety Planning and Suicide Prevention discussed: Yes,  completed with pt  Have you used any form of tobacco in the last 30 days? (Cigarettes, Smokeless Tobacco, Cigars, and/or Pipes): No  Has patient been referred to the Quitline?: N/A patient is not a smoker  Patient has been referred for addiction treatment: N/A  [name] Sabrin Dunlevy 02/03/2016, 3:22 PM

## 2016-02-03 NOTE — Progress Notes (Signed)
Observed in his room sleeping, room assess for environmental hazards, bed alarm checked, on patient and audible on testing; will monitor.

## 2016-02-04 NOTE — Progress Notes (Signed)
Recreation Therapy Notes  INPATIENT RECREATION TR PLAN  Patient Details Name: Christian [name] MRN: [id number] DOB: 09-23-1951 Today's Date: 02/04/2016  Rec Therapy Plan Is patient appropriate for Therapeutic Recreation?: Yes Treatment times per week: At least once a week TR Treatment/Interventions: 1:1 session, Group participation (Comment) (Appropriate participation in daily recreation therapy tx)  Discharge Criteria Pt will be discharged from therapy if:: Treatment goals are met, Discharged Treatment plan/goals/alternatives discussed and agreed upon by:: Patient/family  Discharge Summary Short term goals set: See Care Plan Short term goals met: Complete Progress toward goals comments: One-to-one attended Which groups?: Goal setting One-to-one attended: Self-esteem, stress management Reason goals not met: N/A Therapeutic equipment acquired: None Reason patient discharged from therapy: Discharge from hospital Pt/family agrees with progress & goals achieved: Yes Date patient discharged from therapy: 02/03/16   Leonette Monarch, LRT/CTRS 02/04/2016, 4:25 PM

## 2016-02-06 ENCOUNTER — Other Ambulatory Visit: Payer: Self-pay | Admitting: *Deleted

## 2016-02-06 NOTE — Patient Outreach (Signed)
Attempt made to contact pt's spouse Gloriajean Dell to discuss pt's hospital discharge 3/7 (view in Epic plan was for pt to go to [facility]).    HIPPA compliant voice message left.     [name].   Pierzchala RN CCM [facility] Care Management  [phone]

## 2016-02-09 ENCOUNTER — Encounter: Payer: Self-pay | Admitting: *Deleted

## 2016-02-09 ENCOUNTER — Other Ambulatory Visit: Payer: Self-pay | Admitting: *Deleted

## 2016-02-09 NOTE — Patient Outreach (Signed)
[facility]) Care Management  02/09/2016  Jmarion Delatte Strada 06-08-1951 [id number]   Patient has been hospitalized for over a month now and assisting him with the extra help application has been unsuccessful. I am closing the case and removing myself from the care team. Once patient has been discharged from the hospital and he still needs assistance, he can let his nurse case manager know and I will be glad to assist him.  Jacquelin Krajewski L. Jamir Rone, AAS [facility] Care Management Assistant
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
## 2016-02-10 ENCOUNTER — Other Ambulatory Visit: Payer: Self-pay

## 2016-02-10 NOTE — Patient Outreach (Signed)
I am closing the case to pharmacy due to patient moving to a hospital out of the region.    Steve Rattler, PharmD, [employer] [employer] consultant [phone]

## 2016-04-29 DIAGNOSIS — F515 Nightmare disorder: Secondary | ICD-10-CM | POA: Diagnosis not present

## 2016-04-29 DIAGNOSIS — G4733 Obstructive sleep apnea (adult) (pediatric): Secondary | ICD-10-CM | POA: Diagnosis not present

## 2016-04-29 DIAGNOSIS — F319 Bipolar disorder, unspecified: Secondary | ICD-10-CM | POA: Diagnosis not present

## 2016-04-29 DIAGNOSIS — Z915 Personal history of self-harm: Secondary | ICD-10-CM | POA: Diagnosis not present

## 2016-04-29 DIAGNOSIS — R451 Restlessness and agitation: Secondary | ICD-10-CM | POA: Diagnosis not present

## 2016-05-12 DIAGNOSIS — F319 Bipolar disorder, unspecified: Secondary | ICD-10-CM | POA: Diagnosis not present

## 2016-05-12 DIAGNOSIS — Z452 Encounter for adjustment and management of vascular access device: Secondary | ICD-10-CM | POA: Diagnosis not present

## 2016-09-21 DIAGNOSIS — Q272 Other congenital malformations of renal artery: Secondary | ICD-10-CM | POA: Diagnosis not present

## 2016-09-21 DIAGNOSIS — N2889 Other specified disorders of kidney and ureter: Secondary | ICD-10-CM | POA: Diagnosis not present

## 2016-09-21 DIAGNOSIS — I701 Atherosclerosis of renal artery: Secondary | ICD-10-CM | POA: Diagnosis not present

## 2016-09-23 DIAGNOSIS — I340 Nonrheumatic mitral (valve) insufficiency: Secondary | ICD-10-CM | POA: Diagnosis not present

## 2016-09-23 DIAGNOSIS — I517 Cardiomegaly: Secondary | ICD-10-CM | POA: Diagnosis not present

## 2016-09-23 DIAGNOSIS — I501 Left ventricular failure: Secondary | ICD-10-CM | POA: Diagnosis not present

## 2016-09-23 DIAGNOSIS — I519 Heart disease, unspecified: Secondary | ICD-10-CM | POA: Diagnosis not present

## 2016-09-23 DIAGNOSIS — I700 Atherosclerosis of aorta: Secondary | ICD-10-CM | POA: Diagnosis not present

## 2016-09-23 DIAGNOSIS — I10 Essential (primary) hypertension: Secondary | ICD-10-CM | POA: Diagnosis not present

## 2016-09-23 DIAGNOSIS — Q251 Coarctation of aorta: Secondary | ICD-10-CM | POA: Diagnosis not present

## 2018-02-15 ENCOUNTER — Ambulatory Visit: Payer: Self-pay | Admitting: Podiatry

## 2018-03-26 ENCOUNTER — Observation Stay (HOSPITAL_COMMUNITY)
Admission: EM | Admit: 2018-03-26 | Discharge: 2018-03-31 | Disposition: A | Discharge status: Skilled Nursing Facility | Payer: Medicare PPO | Attending: Internal Medicine | Admitting: Internal Medicine

## 2018-03-26 ENCOUNTER — Encounter (HOSPITAL_COMMUNITY): Payer: Self-pay

## 2018-03-26 ENCOUNTER — Other Ambulatory Visit: Payer: Self-pay

## 2018-03-26 DIAGNOSIS — N189 Chronic kidney disease, unspecified: Secondary | ICD-10-CM | POA: Insufficient documentation

## 2018-03-26 DIAGNOSIS — E1122 Type 2 diabetes mellitus with diabetic chronic kidney disease: Secondary | ICD-10-CM | POA: Diagnosis not present

## 2018-03-26 DIAGNOSIS — M109 Gout, unspecified: Secondary | ICD-10-CM | POA: Insufficient documentation

## 2018-03-26 DIAGNOSIS — R2681 Unsteadiness on feet: Secondary | ICD-10-CM | POA: Diagnosis not present

## 2018-03-26 DIAGNOSIS — E785 Hyperlipidemia, unspecified: Secondary | ICD-10-CM | POA: Diagnosis not present

## 2018-03-26 DIAGNOSIS — F319 Bipolar disorder, unspecified: Secondary | ICD-10-CM | POA: Insufficient documentation

## 2018-03-26 DIAGNOSIS — Z86711 Personal history of pulmonary embolism: Secondary | ICD-10-CM | POA: Insufficient documentation

## 2018-03-26 DIAGNOSIS — Z86718 Personal history of other venous thrombosis and embolism: Secondary | ICD-10-CM | POA: Diagnosis not present

## 2018-03-26 DIAGNOSIS — G4733 Obstructive sleep apnea (adult) (pediatric): Secondary | ICD-10-CM | POA: Diagnosis not present

## 2018-03-26 DIAGNOSIS — M6281 Muscle weakness (generalized): Secondary | ICD-10-CM | POA: Insufficient documentation

## 2018-03-26 DIAGNOSIS — Z9181 History of falling: Secondary | ICD-10-CM | POA: Diagnosis not present

## 2018-03-26 DIAGNOSIS — Z79899 Other long term (current) drug therapy: Secondary | ICD-10-CM | POA: Insufficient documentation

## 2018-03-26 DIAGNOSIS — I2699 Other pulmonary embolism without acute cor pulmonale: Secondary | ICD-10-CM | POA: Diagnosis present

## 2018-03-26 DIAGNOSIS — Z7982 Long term (current) use of aspirin: Secondary | ICD-10-CM | POA: Insufficient documentation

## 2018-03-26 DIAGNOSIS — N390 Urinary tract infection, site not specified: Secondary | ICD-10-CM | POA: Diagnosis present

## 2018-03-26 DIAGNOSIS — Z794 Long term (current) use of insulin: Secondary | ICD-10-CM | POA: Insufficient documentation

## 2018-03-26 DIAGNOSIS — N400 Enlarged prostate without lower urinary tract symptoms: Secondary | ICD-10-CM | POA: Insufficient documentation

## 2018-03-26 DIAGNOSIS — F314 Bipolar disorder, current episode depressed, severe, without psychotic features: Secondary | ICD-10-CM | POA: Diagnosis present

## 2018-03-26 DIAGNOSIS — W19XXXA Unspecified fall, initial encounter: Secondary | ICD-10-CM | POA: Diagnosis present

## 2018-03-26 DIAGNOSIS — R531 Weakness: Secondary | ICD-10-CM | POA: Insufficient documentation

## 2018-03-26 DIAGNOSIS — R262 Difficulty in walking, not elsewhere classified: Secondary | ICD-10-CM | POA: Insufficient documentation

## 2018-03-26 DIAGNOSIS — I129 Hypertensive chronic kidney disease with stage 1 through stage 4 chronic kidney disease, or unspecified chronic kidney disease: Secondary | ICD-10-CM | POA: Insufficient documentation

## 2018-03-26 DIAGNOSIS — W06XXXA Fall from bed, initial encounter: Secondary | ICD-10-CM | POA: Insufficient documentation

## 2018-03-26 DIAGNOSIS — Z7901 Long term (current) use of anticoagulants: Secondary | ICD-10-CM | POA: Insufficient documentation

## 2018-03-26 DIAGNOSIS — Z888 Allergy status to other drugs, medicaments and biological substances status: Secondary | ICD-10-CM | POA: Insufficient documentation

## 2018-03-26 DIAGNOSIS — Z9989 Dependence on other enabling machines and devices: Secondary | ICD-10-CM | POA: Insufficient documentation

## 2018-03-26 DIAGNOSIS — E119 Type 2 diabetes mellitus without complications: Secondary | ICD-10-CM

## 2018-03-26 LAB — BASIC METABOLIC PANEL
Anion gap: 11 (ref 5–15)
BUN: 16 mg/dL (ref 6–20)
CO2: 26 mmol/L (ref 22–32)
Calcium: 9.5 mg/dL (ref 8.9–10.3)
Chloride: 100 mmol/L — ABNORMAL LOW (ref 101–111)
Creatinine, Ser: 1.07 mg/dL (ref 0.61–1.24)
GFR calc Af Amer: >60 mL/min (ref >60)
GFR calc non Af Amer: >60 mL/min (ref >60)
Glucose, Bld: 192 mg/dL — ABNORMAL HIGH (ref 65–99)
Potassium: 3.8 mmol/L (ref 3.5–5.1)
Sodium: 137 mmol/L (ref 135–145)

## 2018-03-26 LAB — URINALYSIS, ROUTINE W REFLEX MICROSCOPIC
Bacteria, UA: NONE SEEN
Bilirubin Urine: NEGATIVE
Glucose, UA: 150 mg/dL — AB
Ketones, ur: NEGATIVE mg/dL
Nitrite: POSITIVE — AB
Protein, ur: 100 mg/dL — AB
Specific Gravity, Urine: 1.014 (ref 1.005–1.030)
pH: 7 (ref 5.0–8.0)

## 2018-03-26 LAB — CBC WITH DIFFERENTIAL/PLATELET
Basophils Absolute: 0 10*3/uL (ref 0.0–0.1)
Basophils Relative: 0 %
Eosinophils Absolute: 0 10*3/uL (ref 0.0–0.7)
Eosinophils Relative: 0 %
HCT: 42.9 % (ref 39.0–52.0)
Hemoglobin: 14.7 g/dL (ref 13.0–17.0)
Lymphocytes Relative: 15 %
Lymphs Abs: 1 10*3/uL (ref 0.7–4.0)
MCH: 30.1 pg (ref 26.0–34.0)
MCHC: 34.3 g/dL (ref 30.0–36.0)
MCV: 87.7 fL (ref 78.0–100.0)
Monocytes Absolute: 0.7 10*3/uL (ref 0.1–1.0)
Monocytes Relative: 10 %
Neutro Abs: 5.3 10*3/uL (ref 1.7–7.7)
Neutrophils Relative %: 75 %
Platelets: 177 10*3/uL (ref 150–400)
RBC: 4.89 MIL/uL (ref 4.22–5.81)
RDW: 12.6 % (ref 11.5–15.5)
WBC: 7 10*3/uL (ref 4.0–10.5)

## 2018-03-26 MED ORDER — SODIUM CHLORIDE 0.9 % IV SOLN
1.0000 g | Freq: Once | INTRAVENOUS | Status: AC
  Administered 2018-03-27: 1 g via INTRAVENOUS
  Filled 2018-03-26: qty 10

## 2018-03-26 NOTE — ED Provider Notes (Signed)
[facility]-EMERGENCY DEPT Provider Note   CSN: [id number] Arrival date & time: 03/26/18  1846     History   Chief Complaint Chief Complaint  Patient presents with  . Fall  . Weakness  . Urinary Frequency    HPI Christian [name] is a 67 y.o. male.  HPI   67 year old male presenting after fall.  Patient states that he rolled out of bed earlier today.  He states that he urinated on himself more than once.  Family came over to deliver him food and found him laying beside the bed.  He states he uses too weak to get up by himself.  He normally ambulates with a walker at baseline.  Denies any injuries from this fall.  He is on Eliquis.  No fevers or chills.  No nausea or vomiting.  He states that he felt like he was in his usual state of health when he went to bed last night.  Past Medical History:  Diagnosis Date  . 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 Bipolar 1 disorder (HCC)   . Bipolar disorder (HCC)   . Chronic kidney disease   . Gout   . Hx of blood clots   . Hypertension   . Mood swings   . OSA on CPAP 06/02/2014  . Other and unspecified hyperlipidemia 06/02/2014  . Type II or unspecified type diabetes mellitus with unspecified complication, uncontrolled 06/02/2014    Patient Active Problem List   Diagnosis Date Noted  . Fall   . Laceration of eyebrow   . Bipolar I disorder, most recent episode depressed, severe w psychosis (HCC) 12/22/2015  . Bipolar affective disorder, depressed, severe, with psychotic behavior (HCC)   . Bipolar I disorder, current or most recent episode depressed, with psychotic features (HCC)   . Bipolar I disorder, most recent episode depressed (HCC)   . Severe bipolar I disorder with depression (HCC)   . Bipolar I disorder, most recent episode depressed, severe with psychotic features (HCC)   . Overdose 09/11/2015  . Acute respiratory failure with hypoxia (HCC) 09/05/2015  . Elevated  troponin 09/05/2015  . Somnolence 09/05/2015  . Bipolar depression (HCC) 09/05/2015  . Acute bilateral deep vein thrombosis (DVT) of femoral veins (HCC) 09/05/2015  . Bilateral pulmonary embolism (HCC) 09/02/2015  . Labile hypertension   . Pulmonary embolism with acute cor pulmonale (HCC)   . Dyspnea   . Orthostatic hypotension   . Bipolar disorder, in partial remission, most recent episode depressed (HCC)   . Syncope, near 08/31/2015  . Affective psychosis, bipolar (HCC)   . Bipolar disorder, now depressed (HCC) 08/28/2015  . Bipolar I disorder, severe, current or most recent episode depressed, with psychotic features (HCC)   . Pressure ulcer 07/30/2015  . Protein-calorie malnutrition, severe (HCC) 07/30/2015  . Dehydration 07/29/2015  . Bipolar disorder, current episode depressed, severe, with psychotic features (HCC) 07/09/2015  . Diabetes (HCC) 07/09/2015  . UTI (urinary tract infection) 06/19/2014  . Positive blood culture 06/19/2014  . Bipolar affective disorder, current episode manic with psychotic symptoms (HCC) 06/17/2014  . Type II or unspecified type diabetes mellitus with unspecified complication, uncontrolled 06/02/2014  . Other and unspecified hyperlipidemia 06/02/2014  . 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 Gout 06/02/2014  . Hypertension     History reviewed. No pertinent surgical history.      Home Medications    Prior to Admission medications  Medication Sig Start Date End Date Taking? Authorizing Provider  allopurinol (ZYLOPRIM) 100 MG tablet Take 1 tablet (100 mg total) by mouth daily. 02/03/16   Clapacs, Jackquline Denmark, MD  apixaban (ELIQUIS) 5 MG TABS tablet Take 1 tablet (5 mg total) by mouth 2 (two) times daily. 09/18/15   Clapacs, Jackquline Denmark, MD  aspirin EC 81 MG EC tablet Take 1 tablet (81 mg total) by mouth daily. Patient not taking: Reported on 12/29/2015 09/18/15   Clapacs, Jackquline Denmark, MD  atorvastatin (LIPITOR) 10 MG  tablet Take 1 tablet (10 mg total) by mouth daily at 6 PM. 09/18/15   Clapacs, Jackquline Denmark, MD  clonazePAM (KLONOPIN) 0.5 MG tablet Take 1 tablet (0.5 mg total) by mouth 3 (three) times daily. 02/03/16   Clapacs, Jackquline Denmark, MD  clonazePAM (KLONOPIN) 1 MG tablet Take 1 tablet (1 mg total) by mouth at bedtime. 09/18/15   Clapacs, Jackquline Denmark, MD  docusate sodium (COLACE) 100 MG capsule Take 2 capsules (200 mg total) by mouth 2 (two) times daily. Patient not taking: Reported on 12/29/2015 09/18/15   Clapacs, Jackquline Denmark, MD  finasteride (PROSCAR) 5 MG tablet Take 1 tablet (5 mg total) by mouth daily. 09/18/15   Clapacs, Jackquline Denmark, MD  furosemide (LASIX) 20 MG tablet Take 20 mg by mouth daily.     [provider]  furosemide (LASIX) 20 MG tablet Take 1 tablet (20 mg total) by mouth daily. 02/03/16   Clapacs, Jackquline Denmark, MD  insulin aspart (NOVOLOG) 100 UNIT/ML injection Inject 0-9 Units into the skin 3 (three) times daily with meals. 02/03/16   Clapacs, Jackquline Denmark, MD  lisinopril (PRINIVIL,ZESTRIL) 20 MG tablet Take 1 tablet (20 mg total) by mouth daily. 02/03/16   Clapacs, Jackquline Denmark, MD  lisinopril (PRINIVIL,ZESTRIL) 40 MG tablet Take 40 mg by mouth daily.    [provider]  metFORMIN (GLUCOPHAGE-XR) 500 MG 24 hr tablet Take 500 mg by mouth 2 (two) times daily.    [provider]  metoprolol tartrate (LOPRESSOR) 25 MG tablet Take 0.5 tablets (12.5 mg total) by mouth 2 (two) times daily. 02/03/16   Clapacs, Jackquline Denmark, MD  mirtazapine (REMERON) 45 MG tablet Take 1 tablet (45 mg total) by mouth at bedtime. 09/18/15   Clapacs, Jackquline Denmark, MD  OLANZapine (ZYPREXA) 15 MG tablet Take 2 tablets (30 mg total) by mouth at bedtime. 09/18/15   Clapacs, Jackquline Denmark, MD  pantoprazole (PROTONIX) 40 MG tablet Take 1 tablet (40 mg total) by mouth daily. 02/03/16   Clapacs, Jackquline Denmark, MD  polyethylene glycol (MIRALAX / GLYCOLAX) packet Take 17 g by mouth daily. Patient not taking: Reported on 12/29/2015 09/18/15   Clapacs, Jackquline Denmark, MD  QUEtiapine  (SEROQUEL) 400 MG tablet Take 1 tablet (400 mg total) by mouth at bedtime. 02/03/16   Clapacs, Jackquline Denmark, MD  tamsulosin (FLOMAX) 0.4 MG CAPS capsule Take 1 capsule (0.4 mg total) by mouth daily after supper. 09/18/15   Clapacs, Jackquline Denmark, MD    Family History Family History  Problem Relation Age of Onset  . Dementia Mother   . Arthritis Father   . Hypertension Father   . Mental illness Sister   . Diabetes Sister   . Heart failure Neg Hx   . Kidney failure Neg Hx   . Cancer Neg Hx     Social History Social History   Tobacco Use  . Smoking status: Never Smoker  . Smokeless tobacco: Never Used  Substance Use Topics  .  Alcohol use: No  . Drug use: No     Allergies   Patient has no known allergies.   Review of Systems Review of Systems  All systems reviewed and negative, other than as noted in HPI.  Physical Exam Updated Vital Signs BP (!) 160/94   Pulse 74   Temp 98.2 F (36.8 C) (Oral)   Resp 16   Ht 6' (1.829 m)   Wt 99.8 kg (220 lb)   SpO2 100%   BMI 29.84 kg/m   Physical Exam  Constitutional: He appears well-developed and well-nourished. No distress.  HENT:  Head: Normocephalic and atraumatic.  Eyes: Conjunctivae are normal. Right eye exhibits no discharge. Left eye exhibits no discharge.  Neck: Neck supple.  Cardiovascular: Normal rate, regular rhythm and normal heart sounds. Exam reveals no gallop and no friction rub.  No murmur heard. Pulmonary/Chest: Effort normal and breath sounds normal. No respiratory distress.  Abdominal: Soft. He exhibits no distension. There is no tenderness.  Musculoskeletal: He exhibits no edema or tenderness.  Neurological: He is alert. No cranial nerve deficit. He exhibits normal muscle tone. Coordination normal.  Speech is somewhat slow but clear. Answering questions appropriately. Cn 2-12 intact. Mild global weakness. No focal motor deficit.   Skin: Skin is warm and dry.  Psychiatric: He has a normal mood and affect. His  behavior is normal.  Nursing note and vitals reviewed.    ED Treatments / Results  Labs (all labs ordered are listed, but only abnormal results are displayed) Labs Reviewed  BASIC METABOLIC PANEL - Abnormal; Notable for the following components:      Result Value   Chloride 100 (*)    Glucose, Bld 192 (*)    All other components within normal limits  URINALYSIS, ROUTINE W REFLEX MICROSCOPIC - Abnormal; Notable for the following components:   APPearance HAZY (*)    Glucose, UA 150 (*)    Hgb urine dipstick SMALL (*)    Protein, ur 100 (*)    Nitrite POSITIVE (*)    Leukocytes, UA TRACE (*)    All other components within normal limits  BASIC METABOLIC PANEL - Abnormal; Notable for the following components:   Chloride 97 (*)    Glucose, Bld 138 (*)    All other components within normal limits  GLUCOSE, CAPILLARY - Abnormal; Notable for the following components:   Glucose-Capillary 143 (*)    All other components within normal limits  GLUCOSE, CAPILLARY - Abnormal; Notable for the following components:   Glucose-Capillary 108 (*)    All other components within normal limits  GLUCOSE, CAPILLARY - Abnormal; Notable for the following components:   Glucose-Capillary 164 (*)    All other components within normal limits  URINE CULTURE  CBC WITH DIFFERENTIAL/PLATELET  HIV ANTIBODY (ROUTINE TESTING)  CBC  CK  TROPONIN I  TSH    EKG None  Radiology No results found.  Procedures Procedures (including critical care time)  Medications Ordered in ED Medications - No data to display   Initial Impression / Assessment and Plan / ED Course  I have reviewed the triage vital signs and the nursing notes.  Pertinent labs & imaging results that were available during my care of the patient were reviewed by me and considered in my medical decision making (see chart for details).     66yM with generalized weakness. Slid from bed but denies any significant injuries from this. Walks  with walker at baseline. Globally weak on exam. No acute  focal deficits. Suspect symptoms from UTI. Lives at home. Discussed with hospitalist service. Will admit for ongoing treatment.   Final Clinical Impressions(s) / ED Diagnoses   Final diagnoses:  Lower urinary tract infectious disease  Generalized weakness    ED Discharge Orders    None       Raeford Razor, MD 03/27/18 1712

## 2018-03-26 NOTE — ED Notes (Signed)
EKG given to EDP,Zammitt,MD., for review. 

## 2018-03-26 NOTE — ED Triage Notes (Signed)
EMS reports from home, unwitnessed fall, no obvious injury, denies pain, On Xarelto. Pt c/o increasing weakness and frequent urination. Pt c/o tremors.  BP 160/100 HR 74 Resp 16 Sp02 98 RA CBG 169

## 2018-03-26 NOTE — ED Notes (Signed)
Talked with Dr Juleen China that pt felt like he was going to pass out and have diarrhea voiced no respiratory or acute distress noted alert and oriented x 3.

## 2018-03-26 NOTE — ED Notes (Signed)
Bed: VX48 Expected date:  Expected time:  Means of arrival:  Comments: 67 yo fall, on floor for a few hours

## 2018-03-26 NOTE — ED Notes (Signed)
No respiratory or acute distress noted resting in bed call light in reach. 

## 2018-03-26 NOTE — ED Notes (Signed)
States he can't get urine specimen at this time and doesn't want to use bedpan to moves his bowels.

## 2018-03-27 ENCOUNTER — Encounter (HOSPITAL_COMMUNITY): Payer: Self-pay | Admitting: Internal Medicine

## 2018-03-27 ENCOUNTER — Other Ambulatory Visit: Payer: Self-pay

## 2018-03-27 DIAGNOSIS — R531 Weakness: Secondary | ICD-10-CM

## 2018-03-27 DIAGNOSIS — E119 Type 2 diabetes mellitus without complications: Secondary | ICD-10-CM

## 2018-03-27 DIAGNOSIS — N3000 Acute cystitis without hematuria: Secondary | ICD-10-CM | POA: Diagnosis not present

## 2018-03-27 DIAGNOSIS — G4733 Obstructive sleep apnea (adult) (pediatric): Secondary | ICD-10-CM

## 2018-03-27 DIAGNOSIS — I2699 Other pulmonary embolism without acute cor pulmonale: Secondary | ICD-10-CM | POA: Diagnosis present

## 2018-03-27 LAB — BASIC METABOLIC PANEL
Anion gap: 12 (ref 5–15)
BUN: 16 mg/dL (ref 6–20)
CO2: 26 mmol/L (ref 22–32)
Calcium: 9.4 mg/dL (ref 8.9–10.3)
Chloride: 97 mmol/L — ABNORMAL LOW (ref 101–111)
Creatinine, Ser: 1.09 mg/dL (ref 0.61–1.24)
GFR calc Af Amer: >60 mL/min (ref >60)
GFR calc non Af Amer: >60 mL/min (ref >60)
Glucose, Bld: 138 mg/dL — ABNORMAL HIGH (ref 65–99)
Potassium: 4 mmol/L (ref 3.5–5.1)
Sodium: 135 mmol/L (ref 135–145)

## 2018-03-27 LAB — GLUCOSE, CAPILLARY
Glucose-Capillary: 143 mg/dL — ABNORMAL HIGH (ref 65–99)
Glucose-Capillary: 108 mg/dL — ABNORMAL HIGH (ref 65–99)
Glucose-Capillary: 164 mg/dL — ABNORMAL HIGH (ref 65–99)
Glucose-Capillary: 162 mg/dL — ABNORMAL HIGH (ref 65–99)

## 2018-03-27 LAB — CBC
HCT: 44.2 % (ref 39.0–52.0)
Hemoglobin: 15.1 g/dL (ref 13.0–17.0)
MCH: 30.3 pg (ref 26.0–34.0)
MCHC: 34.2 g/dL (ref 30.0–36.0)
MCV: 88.8 fL (ref 78.0–100.0)
Platelets: 162 10*3/uL (ref 150–400)
RBC: 4.98 MIL/uL (ref 4.22–5.81)
RDW: 12.8 % (ref 11.5–15.5)
WBC: 5.6 10*3/uL (ref 4.0–10.5)

## 2018-03-27 LAB — TROPONIN I: Troponin I: <0.03 ng/mL (ref <0.03)

## 2018-03-27 LAB — HIV ANTIBODY (ROUTINE TESTING W REFLEX): HIV Screen 4th Generation wRfx: NONREACTIVE

## 2018-03-27 LAB — CK: Total CK: 89 U/L (ref 49–397)

## 2018-03-27 LAB — TSH: TSH: 3.003 u[IU]/mL (ref 0.350–4.500)

## 2018-03-27 MED ORDER — CLONAZEPAM 1 MG PO TABS: 1.0000 mg | ORAL_TABLET | Freq: Every day | ORAL | Status: DC

## 2018-03-27 MED ORDER — INSULIN ASPART 100 UNIT/ML ~~LOC~~ SOLN
0.0000 [IU] | Freq: Three times a day (TID) | SUBCUTANEOUS | Status: DC
Start: 2018-03-27 — End: 2018-03-31
  Administered 2018-03-27: 2 [IU] via SUBCUTANEOUS
  Administered 2018-03-27 – 2018-03-28 (×2): 1 [IU] via SUBCUTANEOUS
  Administered 2018-03-28 – 2018-03-29 (×2): 2 [IU] via SUBCUTANEOUS
  Administered 2018-03-30: 1 [IU] via SUBCUTANEOUS
  Administered 2018-03-31: 3 [IU] via SUBCUTANEOUS

## 2018-03-27 MED ORDER — FINASTERIDE 5 MG PO TABS: 5.0000 mg | ORAL_TABLET | Freq: Every day | ORAL | Status: DC

## 2018-03-27 MED ORDER — APIXABAN 5 MG PO TABS: 5.0000 mg | ORAL_TABLET | Freq: Two times a day (BID) | ORAL | Status: DC

## 2018-03-27 MED ORDER — SENNA 8.6 MG PO TABS
1.0000 | ORAL_TABLET | Freq: Two times a day (BID) | ORAL | Status: DC
  Administered 2018-03-28 – 2018-03-31 (×6): 8.6 mg via ORAL
  Filled 2018-03-27 (×8): qty 1

## 2018-03-27 MED ORDER — CLONIDINE HCL 0.2 MG PO TABS
0.2000 mg | ORAL_TABLET | Freq: Two times a day (BID) | ORAL | Status: DC
  Administered 2018-03-27 – 2018-03-31 (×8): 0.2 mg via ORAL
  Filled 2018-03-27 (×8): qty 1

## 2018-03-27 MED ORDER — TAMSULOSIN HCL 0.4 MG PO CAPS: 0.4000 mg | ORAL_CAPSULE | Freq: Every day | ORAL | Status: DC

## 2018-03-27 MED ORDER — PREMIER PROTEIN SHAKE
11.0000 [oz_av] | Freq: Two times a day (BID) | ORAL | Status: DC
  Administered 2018-03-27 – 2018-03-31 (×4): 11 [oz_av] via ORAL
  Filled 2018-03-27 (×9): qty 325.31

## 2018-03-27 MED ORDER — ARIPIPRAZOLE 10 MG PO TABS
10.0000 mg | ORAL_TABLET | Freq: Every day | ORAL | Status: DC
  Administered 2018-03-27 – 2018-03-31 (×5): 10 mg via ORAL
  Filled 2018-03-27 (×5): qty 1

## 2018-03-27 MED ORDER — HYDROCHLOROTHIAZIDE 25 MG PO TABS
25.0000 mg | ORAL_TABLET | Freq: Every day | ORAL | Status: DC
  Administered 2018-03-27 – 2018-03-31 (×5): 25 mg via ORAL
  Filled 2018-03-27 (×5): qty 1

## 2018-03-27 MED ORDER — AMLODIPINE BESYLATE 10 MG PO TABS
10.0000 mg | ORAL_TABLET | Freq: Every day | ORAL | Status: DC
  Administered 2018-03-27 – 2018-03-31 (×5): 10 mg via ORAL
  Filled 2018-03-27 (×5): qty 1

## 2018-03-27 MED ORDER — QUETIAPINE FUMARATE 100 MG PO TABS: 400.0000 mg | ORAL_TABLET | Freq: Every day | ORAL | Status: DC

## 2018-03-27 MED ORDER — OLANZAPINE 5 MG PO TABS: 30.0000 mg | ORAL_TABLET | Freq: Every day | ORAL | Status: DC

## 2018-03-27 MED ORDER — METOPROLOL TARTRATE 25 MG PO TABS: 12.5000 mg | ORAL_TABLET | Freq: Two times a day (BID) | ORAL | Status: DC

## 2018-03-27 MED ORDER — HYDRALAZINE HCL 20 MG/ML IJ SOLN
5.0000 mg | INTRAMUSCULAR | Status: DC | PRN
  Administered 2018-03-29: 5 mg via INTRAVENOUS
  Filled 2018-03-27: qty 1

## 2018-03-27 MED ORDER — DIVALPROEX SODIUM 250 MG PO DR TAB
1000.0000 mg | DELAYED_RELEASE_TABLET | Freq: Every day | ORAL | Status: DC
  Administered 2018-03-27 – 2018-03-30 (×4): 1000 mg via ORAL
  Filled 2018-03-27 (×4): qty 4

## 2018-03-27 MED ORDER — RIVAROXABAN 10 MG PO TABS
10.0000 mg | ORAL_TABLET | Freq: Every evening | ORAL | Status: DC
  Administered 2018-03-27 – 2018-03-30 (×4): 10 mg via ORAL
  Filled 2018-03-27 (×5): qty 1

## 2018-03-27 MED ORDER — ACETAMINOPHEN 650 MG RE SUPP: 650.0000 mg | Freq: Four times a day (QID) | RECTAL | Status: DC | PRN

## 2018-03-27 MED ORDER — CLONAZEPAM 0.5 MG PO TABS: 0.5000 mg | ORAL_TABLET | Freq: Three times a day (TID) | ORAL | Status: DC

## 2018-03-27 MED ORDER — ALLOPURINOL 100 MG PO TABS: 100.0000 mg | ORAL_TABLET | Freq: Every day | ORAL | Status: DC

## 2018-03-27 MED ORDER — FLUOXETINE HCL 20 MG PO CAPS
20.0000 mg | ORAL_CAPSULE | Freq: Every day | ORAL | Status: DC
  Administered 2018-03-27 – 2018-03-31 (×5): 20 mg via ORAL
  Filled 2018-03-27 (×5): qty 1

## 2018-03-27 MED ORDER — LISINOPRIL 20 MG PO TABS: 40.0000 mg | ORAL_TABLET | Freq: Every day | ORAL | Status: DC

## 2018-03-27 MED ORDER — CEFTRIAXONE SODIUM 1 G IJ SOLR
1.0000 g | INTRAMUSCULAR | Status: DC
  Administered 2018-03-27 – 2018-03-28 (×2): 1 g via INTRAVENOUS
  Filled 2018-03-27 (×2): qty 1
  Filled 2018-03-27: qty 10

## 2018-03-27 MED ORDER — ACETAMINOPHEN 325 MG PO TABS: 650.0000 mg | ORAL_TABLET | Freq: Four times a day (QID) | ORAL | Status: DC | PRN

## 2018-03-27 MED ORDER — PANTOPRAZOLE SODIUM 40 MG PO TBEC: 40.0000 mg | DELAYED_RELEASE_TABLET | Freq: Every day | ORAL | Status: DC

## 2018-03-27 MED ORDER — METOPROLOL SUCCINATE ER 100 MG PO TB24
100.0000 mg | ORAL_TABLET | Freq: Two times a day (BID) | ORAL | Status: DC
  Administered 2018-03-27 – 2018-03-31 (×8): 100 mg via ORAL
  Filled 2018-03-27 (×8): qty 1

## 2018-03-27 MED ORDER — LATANOPROST 0.005 % OP SOLN
1.0000 [drp] | Freq: Every day | OPHTHALMIC | Status: DC
  Administered 2018-03-27 – 2018-03-30 (×4): 1 [drp] via OPHTHALMIC
  Filled 2018-03-27: qty 2.5

## 2018-03-27 MED ORDER — MIRTAZAPINE 15 MG PO TABS: 45.0000 mg | ORAL_TABLET | Freq: Every day | ORAL | Status: DC

## 2018-03-27 MED ORDER — ATORVASTATIN CALCIUM 10 MG PO TABS: 10.0000 mg | ORAL_TABLET | Freq: Every day | ORAL | Status: DC

## 2018-03-27 NOTE — Progress Notes (Signed)
  PROGRESS NOTE  Christian [name] LKT:[id number] DOB: 1951/06/23 DOA: 03/26/2018 PCP: [name], MD  Brief Narrative: 67 year old man PMH pulmonary embolism, DVT, on apixaban, bipolar disorder, gout, diabetes mellitus presented after developing generalized weakness and "slide" to the floor.  He was unable to get up secondary to being too weak.  He was brought to the emergency department for treatment of generalized weakness and UTI.  Assessment/Plan UTI with associated generalized weakness. --Continue empiric antibiotics. --Physical therapy consultation  Diabetes mellitus type 2.  Hold metformin while inpatient. --CBG stable. Continue sliding scale insulin.  PMH pulmonary embolism, DVT. --Continue apixaban  Essential hypertension --Continue Toprol-XL, hydrochlorothiazide, clonidine, amlodipine  Obstructive sleep apnea --CPAP QHS  Bipolar disorder --Continue chronic medications including Abilify, Depakote, Prozac   DVT prophylaxis: rivaroxaban Code Status: full Family Communication: daughter at bedside Disposition Plan: home    Brendia Sacks, MD  [facility] Direct contact: [phone] --Via amion app OR  --[email or web address]; password [email or web address]  7PM-7AM contact night coverage as above 03/27/2018, 4:09 PM  LOS: 0 days   Consultants:    Procedures:    Antimicrobials:  Ceftriaxone 4/28 >>  Interval history/Subjective: Feels better.  No complaints.  Objective: Vitals:  Vitals:   03/27/18 0818 03/27/18 1349  BP: 133/89 (!) 158/87  Pulse: 68 68  Resp:  18  Temp:  98.9 F (37.2 C)  SpO2: 100% 98%    Exam:  Constitutional:  . Appears calm and comfortable ENMT:  . grossly normal hearing  Respiratory:  . CTA bilaterally, no w/r/r.  . Respiratory effort normal.  Cardiovascular:  . RRR, no m/r/g . 1+ bilateral LE extremity edema   Abdomen:  . Abdomen appears normal; no tenderness or masses . No hernias . No HSM Musculoskeletal:  . RUE, LUE,  RLE, LLE   o strength globally diminished but appears symmetric; tone normal Psychiatric:  . Mental status o Mood, affect appropriate  I have personally reviewed the following:   Labs:  Basic metabolic panel unremarkable  CBC unremarkable  Troponin, CK unremarkable  TSH within normal limits  Urine culture pending   Scheduled Meds: . insulin aspart  0-9 Units Subcutaneous TID WC  . protein supplement shake  11 oz Oral BID BM   Continuous Infusions: . cefTRIAXone (ROCEPHIN)  IV      Principal Problem:   Generalized weakness Active Problems:   Gout   UTI (urinary tract infection)   Severe bipolar I disorder with depression (HCC)   Fall   Diabetes mellitus type 2 in nonobese (HCC)   Pulmonary embolism (HCC)   LOS: 0 days

## 2018-03-27 NOTE — Progress Notes (Signed)
Patient declines the use of nocturnal CPAP tonight. He is aware that if he should change his mind and want to wear it, he may request RT assistance. Equipment remains at bedside per patient request for tomorrow night. RT will continue to follow.

## 2018-03-27 NOTE — Progress Notes (Signed)
Initial Nutrition Assessment  DOCUMENTATION CODES:   Not applicable  INTERVENTION:   Premier Protein BID, each supplement provides 160 kcal and 30 grams of protein.   NUTRITION DIAGNOSIS:   Inadequate oral intake related to decreased appetite, other (see comment)(difficulty chewing) as evidenced by per patient/family report  GOAL:   Patient will meet greater than or equal to 90% of their needs  MONITOR:   PO intake, Supplement acceptance, Labs, Weight trends, I & O's  REASON FOR ASSESSMENT:   Malnutrition Screening Tool    ASSESSMENT:   Pt with PMH of Type II DM, OSA on CPAP, HTN, CKD, gout, biploar disorder presents with generalized weakness and falls    Pt reports 20lb weight loss over the past couple months. Reporting a UBW of 240 lbs. Weight records are limited per chart review, last recorded weights from 2017.   Pt reports a slight decrease in appetite because of reported depression. Pt reports consuming 3 smaller than normal size meals/day. Pt typically drinks water, soda, juices, and fruit punch. Meal completion record indicate pt is consumed 100% of breakfast this morning.   Pt denies nausea, vomiting and constipation at visit but states he recently experienced some diarrhea.   Pt reports he uses a "rollator" to ambulate at baseline.   Labs reviewed; CBG 143 Medcations reviewed; sliding scale insulin, Remeron, Protonix  NUTRITION - FOCUSED PHYSICAL EXAM:    Most Recent Value  Orbital Region  No depletion  Upper Arm Region  No depletion  Thoracic and Lumbar Region  No depletion  Buccal Region  No depletion  Temple Region  Mild depletion  Clavicle Bone Region  Mild depletion  Clavicle and Acromion Bone Region  No depletion  Scapular Bone Region  Unable to assess  Dorsal Hand  No depletion  Patellar Region  No depletion  Anterior Thigh Region  Moderate depletion  Posterior Calf Region  No depletion  Edema (RD Assessment)  None      Diet Order:  Diet  heart healthy/carb modified Room service appropriate? Yes; Fluid consistency: Thin  EDUCATION NEEDS:   Not appropriate for education at this time  Skin:  Skin Assessment: Reviewed RN Assessment  Last BM:  Unknown BM date  Height:   Ht Readings from Last 1 Encounters:  03/27/18 6' (1.829 m)    Weight:   Wt Readings from Last 1 Encounters:  03/27/18 219 lb 9.3 oz (99.6 kg)    Ideal Body Weight:  80.9 kg  BMI:  Body mass index is 29.78 kg/m.  Estimated Nutritional Needs:   Kcal:  2100-2300  Protein:  100-110 grams  Fluid:  >/= 2.1 L/d  Wylene Simmer, MS, RDN, LDN 03/27/2018 12:51 PM

## 2018-03-27 NOTE — Evaluation (Signed)
Physical Therapy Evaluation Patient Details Name: Christian [name] MRN: [id number] DOB: [birth year] Today's Date: 03/27/2018   History of Present Illness  67 yo male admitted with generalized weaknes, fall, UTI. Hx of bipolar d/o, mood swings, DM, DVTs, PE, gout, CKD, hypotension, bil LE edema    Clinical Impression  On eval, pt required Min assist for mobility. He walked ~75 feet with a RW. Pt presents with general weakness, decreased activity tolerance, and impaired gait and balance. Unsure of cognitive baseline-no family present. Discussed d/c plan-recommended ST rehab stay however pt was not agreeable to placement during evaluation. At start of session, pt stated " I can't walk." After some encouragement, pt was willing to try. He admits to feeling weak and he is unsure of how he will manage at home. Recommend ST rehab at SNF, if pt will agree. If he refuses placement, would set pt up with home health care. Will follow and progress activity as tolerated.     Follow Up Recommendations SNF (if pt will agree. If he refuses placement, then HHPT, home health aide if possible)    Equipment Recommendations  None recommended by PT    Recommendations for Other Services       Precautions / Restrictions Precautions Precautions: Fall Restrictions Weight Bearing Restrictions: No      Mobility  Bed Mobility Overal bed mobility: Needs Assistance Bed Mobility: Supine to Sit;Sit to Supine     Supine to sit: Supervision;HOB elevated Sit to supine: Supervision;HOB elevated   General bed mobility comments: for lines. increased time.   Transfers Overall transfer level: Needs assistance Equipment used: Rolling walker (2 wheeled) Transfers: Sit to/from Stand Sit to Stand: Min guard         General transfer comment: close guard for safety. VCs safety, hand placement.   Ambulation/Gait Ambulation/Gait assistance: Min assist Ambulation Distance (Feet): 75 Feet Assistive device: Rolling  walker (2 wheeled) Gait Pattern/deviations: Step-through pattern;Decreased stride length     General Gait Details: Assist to steady throughout ambulation. Shakiness noted. Cues for safety. Slow gait speed.  Stairs            Wheelchair Mobility    Modified Rankin (Stroke Patients Only)       Balance Overall balance assessment: Needs assistance;History of Falls         Standing balance support: Bilateral upper extremity supported Standing balance-Leahy Scale: Poor                               Pertinent Vitals/Pain Pain Assessment: No/denies pain    Home Living Family/patient expects to be discharged to:: Private residence Living Arrangements: Alone   Type of Home: Apartment Home Access: Stairs to enter Entrance Stairs-Rails: None Entrance Stairs-Number of Steps: a few Home Layout: One level Home Equipment: Environmental consultant - 4 wheels      Prior Function Level of Independence: Independent with assistive device(s)               Hand Dominance        Extremity/Trunk Assessment   Upper Extremity Assessment Upper Extremity Assessment: Overall WFL for tasks assessed    Lower Extremity Assessment Lower Extremity Assessment: Generalized weakness    Cervical / Trunk Assessment Cervical / Trunk Assessment: Kyphotic  Communication   Communication: No difficulties  Cognition Arousal/Alertness: Awake/alert Behavior During Therapy: WFL for tasks assessed/performed Overall Cognitive Status: Within Functional Limits for tasks assessed  General Comments: a bit soft spoken and slow to respond to questions      General Comments      Exercises     Assessment/Plan    PT Assessment Patient needs continued PT services  PT Problem List Decreased strength;Decreased balance;Decreased mobility;Decreased activity tolerance;Decreased safety awareness       PT Treatment Interventions DME instruction;Gait  training;Functional mobility training;Therapeutic activities;Patient/family education;Balance training;Therapeutic exercise    PT Goals (Current goals can be found in the Care Plan section)  Acute Rehab PT Goals Patient Stated Goal: home.  PT Goal Formulation: With patient Time For Goal Achievement: 04/10/18 Potential to Achieve Goals: Good    Frequency Min 3X/week   Barriers to discharge        Co-evaluation               AM-PAC PT "6 Clicks" Daily Activity  Outcome Measure Difficulty turning over in bed (including adjusting bedclothes, sheets and blankets)?: A Little Difficulty moving from lying on back to sitting on the side of the bed? : A Little Difficulty sitting down on and standing up from a chair with arms (e.g., wheelchair, bedside commode, etc,.)?: A Little Help needed moving to and from a bed to chair (including a wheelchair)?: A Little Help needed walking in hospital room?: A Little Help needed climbing 3-5 steps with a railing? : A Little 6 Click Score: 18    End of Session Equipment Utilized During Treatment: Gait belt Activity Tolerance: Patient tolerated treatment well Patient left: in bed;with call bell/phone within reach;with bed alarm set   PT Visit Diagnosis: Muscle weakness (generalized) (M62.81);Difficulty in walking, not elsewhere classified (R26.2);Unsteadiness on feet (R26.81);History of falling (Z91.81)    Time: 9629-5284 PT Time Calculation (min) (ACUTE ONLY): 12 min   Charges:   PT Evaluation $PT Eval Moderate Complexity: 1 Mod     PT G Codes:          Rebeca Alert, MPT Pager: [phone]

## 2018-03-27 NOTE — Care Management Obs Status (Signed)
MEDICARE OBSERVATION STATUS NOTIFICATION   Patient Details  Name: Christian [name] MRN: [id number] Date of Birth: Aug 05, 1951   Medicare Observation Status Notification Given:  Yes    MahabirOlegario Messier, RN 03/27/2018, 3:16 PM

## 2018-03-27 NOTE — Care Management Note (Signed)
Case Management Note  Patient Details  Name: Christian [name] MRN: [id number] Date of Birth: Apr 28, 1951  Subjective/Objective:  PT recc SNF. Patient agrees to SNF-CSW notified.                  Action/Plan:d/c SNF.   Expected Discharge Date:                  Expected Discharge Plan:  Skilled Nursing Facility  In-House Referral:  Clinical Social Work  Discharge planning Services  CM Consult  Post Acute Care Choice:    Choice offered to:     DME Arranged:    DME Agency:     HH Arranged:    HH Agency:     Status of Service:  In process, will continue to follow  If discussed at Long Length of Stay Meetings, dates discussed:    Additional Comments:  Lanier Clam, RN 03/27/2018, 3:16 PM

## 2018-03-27 NOTE — Care Management Note (Signed)
Case Management Note  Patient Details  Name: Christian [name] MRN: [id number] Date of Birth: 02-15-1951  Subjective/Objective: 67 y/o m admitted w/fall/weakness.UTI. From home. PT cons-await recc.                   Action/Plan:d/c plan home.   Expected Discharge Date:                  Expected Discharge Plan:  Home/Self Care  In-House Referral:     Discharge planning Services  CM Consult  Post Acute Care Choice:    Choice offered to:     DME Arranged:    DME Agency:     HH Arranged:    HH Agency:     Status of Service:  In process, will continue to follow  If discussed at Long Length of Stay Meetings, dates discussed:    Additional Comments:  Lanier Clam, RN 03/27/2018, 11:29 AM

## 2018-03-27 NOTE — Progress Notes (Signed)
Pharmacy Antibiotic Note  Christian [name] is a 67 y.o. male admitted on 03/26/2018 with UTI.  Pharmacy has been consulted for ceftriaxone dosing.  Plan:  Ceftriaxone 1 gr IV q24h   Monitor clinical course, renal function, cultures as available  Dosage will likely remain stable at above dosage and need for further dosage adjustment appears unlikely at present.    Will sign off at this time.  Please reconsult if a change in clinical status warrants re-evaluation of dosage.     Height: 6' (182.9 cm) Weight: 219 lb 9.3 oz (99.6 kg) IBW/kg (Calculated) : 77.6  Temp (24hrs), Avg:98.5 F (36.9 C), Min:98.2 F (36.8 C), Max:98.8 F (37.1 C)  Recent Labs  Lab 03/26/18 2013 03/27/18 0538  WBC 7.0 5.6  CREATININE 1.07 1.09    Estimated Creatinine Clearance: 83.7 mL/min (by C-G formula based on SCr of 1.09 mg/dL).    No Known Allergies  Antimicrobials this admission: 4/28 ceftriaxone >>    Dose adjustments this admission:   Microbiology results: 4/28 UCx: sent 4/29 HIV antibody: IP   Thank you for allowing pharmacy to be a part of this patient's care.  Adalberto Cole, PharmD, BCPS Pager [phone] 03/27/2018 11:59 AM

## 2018-03-27 NOTE — ED Notes (Signed)
ED TO INPATIENT HANDOFF REPORT  Name/Age/Gender Christian [name] 67 y.o. male  Code Status Code Status History    Date Active Date Inactive Code Status Order ID Comments User Context   12/22/2015 1337 02/03/2016 1950 Full Code [id number]  Gonzella Lex, MD Inpatient   12/22/2015 0830 12/22/2015 1337 Full Code [id number]  Gonzella Lex, MD ED   09/05/2015 2043 09/18/2015 1817 Full Code [id number]  Clapacs, Madie Reno, MD Inpatient   08/31/2015 1702 09/05/2015 2030 Full Code [id number]  Baxter Hire, MD Inpatient   08/28/2015 2019 08/31/2015 1702 Full Code [id number]  Gonzella Lex, MD Inpatient   07/29/2015 2028 08/27/2015 1932 Full Code [id number]  [name], MD Inpatient   07/16/2015 1213 07/29/2015 2028 Full Code [id number]  Clovis Fredrickson, MD Inpatient   07/15/2015 1751 07/16/2015 1213 Full Code [id number]  Dewain Penning, MD Inpatient   07/14/2015 1913 07/15/2015 1751 Full Code [id number]  Dewain Penning, MD Inpatient   07/11/2015 1703 07/14/2015 1913 Full Code [id number]  Vaughan Basta, MD Inpatient   07/09/2015 2313 07/11/2015 1703 Full Code [id number]  Maris Berger, RN Inpatient   06/28/2015 2306 06/30/2015 1134 Full Code [id number]  Abigail Butts, PA-C ED   06/21/2014 1533 07/16/2014 1824 Full Code [id number]  Elmarie Shiley, NP Inpatient   06/19/2014 1454 06/21/2014 1533 Full Code [id number]  Velvet Bathe, MD Inpatient   06/17/2014 0545 06/19/2014 1454 Full Code [id number]  Artis Delay, MD ED   05/25/2014 0455 06/05/2014 1827 Full Code [id number]  Lurena Nida, NP Inpatient   05/24/2014 0408 05/25/2014 0455 Full Code [id number]  Dammen, Clayborn Bigness ED      Home/SNF/Other Home  Chief Complaint general weakness  Level of Care/Admitting Diagnosis ED Disposition    ED Disposition Condition [facility] [100102]  Level of Care: Telemetry [5]  Admit to tele based on following criteria: Monitor QTC interval  Diagnosis: Weakness  [700174]  Admitting Physician: Rise Patience [phone]  Attending Physician: Rise Patience [email or web address]  PT Class (Do Not Modify): Observation [104]  PT Acc Code (Do Not Modify): Observation [10022]       Medical History Past Medical History:  Diagnosis Date  . 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 Bipolar 1 disorder ([place])   . Bipolar disorder ([place])   . Chronic kidney disease   . Gout   . Hx of blood clots   . Hypertension   . Mood swings   . OSA on CPAP 06/02/2014  . Other and unspecified hyperlipidemia 06/02/2014  . Type II or unspecified type diabetes mellitus with unspecified complication, uncontrolled 06/02/2014    Allergies No Known Allergies  IV Location/Drains/Wounds Patient Lines/Drains/Airways Status   Active Line/Drains/Airways    Name:   Placement date:   Placement time:   Site:   Days:   Peripheral IV 03/27/18 Left;Upper Arm   03/27/18    0026    Arm   less than 1   Wound / Incision (Open or Dehisced) 08/31/15 Diabetic ulcer Heel Left;Medial   08/31/15    1730    Heel   939   Wound / Incision (Open or Dehisced) 01/23/16 Laceration Other (Comment)   01/23/16    0545    Other (Comment)   794          Labs/Imaging Results for orders placed or performed during the hospital encounter of 03/26/18 (from the  past 48 hour(s))  CBC with Differential     Status: None   Collection Time: 03/26/18  8:13 PM  Result Value Ref Range   WBC 7.0 4.0 - 10.5 K/uL   RBC 4.89 4.22 - 5.81 MIL/uL   Hemoglobin 14.7 13.0 - 17.0 g/dL   HCT 42.9 39.0 - 52.0 %   MCV 87.7 78.0 - 100.0 fL   MCH 30.1 26.0 - 34.0 pg   MCHC 34.3 30.0 - 36.0 g/dL   RDW 12.6 11.5 - 15.5 %   Platelets 177 150 - 400 K/uL   Neutrophils Relative % 75 %   Neutro Abs 5.3 1.7 - 7.7 K/uL   Lymphocytes Relative 15 %   Lymphs Abs 1.0 0.7 - 4.0 K/uL   Monocytes Relative 10 %   Monocytes Absolute 0.7 0.1 - 1.0 K/uL   Eosinophils Relative 0 %   Eosinophils Absolute 0.0  0.0 - 0.7 K/uL   Basophils Relative 0 %   Basophils Absolute 0.0 0.0 - 0.1 K/uL    Comment: Performed at [facility], [place] [address], [place], [place] [address]  Basic metabolic panel     Status: Abnormal   Collection Time: 03/26/18  8:13 PM  Result Value Ref Range   Sodium 137 135 - 145 mmol/L   Potassium 3.8 3.5 - 5.1 mmol/L   Chloride 100 (L) 101 - 111 mmol/L   CO2 26 22 - 32 mmol/L   Glucose, Bld 192 (H) 65 - 99 mg/dL   BUN 16 6 - 20 mg/dL   Creatinine, Ser 1.07 0.61 - 1.24 mg/dL   Calcium 9.5 8.9 - 10.3 mg/dL   GFR calc non Af Amer >60 >60 mL/min   GFR calc Af Amer >60 >60 mL/min    Comment: (NOTE) The eGFR has been calculated using the CKD EPI equation. This calculation has not been validated in all clinical situations. eGFR's persistently <60 mL/min signify possible Chronic Kidney Disease.    Anion gap 11 5 - 15    Comment: Performed at [facility], [place] [address] [place], [place] [address]  Urinalysis, Routine w reflex microscopic     Status: Abnormal   Collection Time: 03/26/18 10:47 PM  Result Value Ref Range   Color, Urine YELLOW YELLOW   APPearance HAZY (A) CLEAR   Specific Gravity, Urine 1.014 1.005 - 1.030   pH 7.0 5.0 - 8.0   Glucose, UA 150 (A) NEGATIVE mg/dL   Hgb urine dipstick SMALL (A) NEGATIVE   Bilirubin Urine NEGATIVE NEGATIVE   Ketones, ur NEGATIVE NEGATIVE mg/dL   Protein, ur 100 (A) NEGATIVE mg/dL   Nitrite POSITIVE (A) NEGATIVE   Leukocytes, UA TRACE (A) NEGATIVE   RBC / HPF 0-5 0 - 5 RBC/hpf   WBC, UA 6-10 0 - 5 WBC/hpf   Bacteria, UA NONE SEEN NONE SEEN   Mucus PRESENT    Hyaline Casts, UA PRESENT     Comment: Performed at [facility], [place] [address], [place], [place] [address]   No results found.  Pending Labs Unresulted Labs (From admission, onward)   Start     Ordered   03/26/18 1926  Urine culture  STAT,   STAT     03/26/18 1925      Vitals/Pain Today's Vitals    03/26/18 1901 03/26/18 2111 03/26/18 2154 03/27/18 0026  BP:  (!) 148/85 (!) 154/88 (!) 155/101  Pulse:  69 64 75  Resp:  (!) 23 (!) 22  18  Temp:      TempSrc:      SpO2:  99% 97% 100%  Weight: 220 lb (99.8 kg)     Height: 6' (1.829 m)     PainSc: 0-No pain  0-No pain     Isolation Precautions No active isolations  Medications Medications  cefTRIAXone (ROCEPHIN) 1 g in sodium chloride 0.9 % 100 mL IVPB (has no administration in time range)    Mobility walks with device

## 2018-03-27 NOTE — H&P (Signed)
History and Physical    Christian [name] KGM:[id number] DOB: May 10, 1951 DOA: 03/26/2018  PCP: [name], MD  Patient coming from: Home.  Chief Complaint: Fall and weakness.  HPI: Christian [name] is a 67 y.o. 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  Patient states yesterday he was walking to the bathroom and he slid and fell.  Denies hitting his head.  Following the fall patient felt very weak to get up.  He had his cell phone on his hand and called her sister and his sister called EMS and was brought to the ER.  Denies any chest pain shortness of breath palpitation nausea vomiting or diarrhea.  Denies having any new change in medications.  Has not missed his apixaban dose.  ED Course: In the ER EKG shows normal sinus rhythm with acceptable QTc interval.  Patient was not hypotensive.  UA shows features consistent with UTI and was started on antibiotics.  Since patient remains to be weak patient admitted for further observation.  Review of Systems: As per HPI, rest all negative.   Past Medical History:  Diagnosis Date  . 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 Bipolar 1 disorder (HCC)   . Bipolar disorder (HCC)   . Chronic kidney disease   . Gout   . Hx of blood clots   . Hypertension   . Mood swings   . OSA on CPAP 06/02/2014  . Other and unspecified hyperlipidemia 06/02/2014  . Type II or unspecified type diabetes mellitus with unspecified complication, uncontrolled 06/02/2014    History reviewed. No pertinent surgical history.   reports that he has never smoked. He has never used smokeless tobacco. He reports that he does not drink alcohol or use drugs.  No Known Allergies  Family History  Problem Relation Age of Onset  . Dementia Mother   . Arthritis Father   . Hypertension  Father   . Mental illness Sister   . Diabetes Sister   . Heart failure Neg Hx   . Kidney failure Neg Hx   . Cancer Neg Hx     Prior to Admission medications   Medication Sig Start Date End Date Taking? Authorizing Provider  allopurinol (ZYLOPRIM) 100 MG tablet Take 1 tablet (100 mg total) by mouth daily. 02/03/16   Clapacs, Jackquline Denmark, MD  apixaban (ELIQUIS) 5 MG TABS tablet Take 1 tablet (5 mg total) by mouth 2 (two) times daily. 09/18/15   Clapacs, Jackquline Denmark, MD  aspirin EC 81 MG EC tablet Take 1 tablet (81 mg total) by mouth daily. Patient not taking: Reported on 12/29/2015 09/18/15   Clapacs, Jackquline Denmark, MD  atorvastatin (LIPITOR) 10 MG tablet Take 1 tablet (10 mg total) by mouth daily at 6 PM. 09/18/15   Clapacs, Jackquline Denmark, MD  clonazePAM (KLONOPIN) 0.5 MG tablet Take 1 tablet (0.5 mg total) by mouth 3 (three) times daily. 02/03/16   Clapacs, Jackquline Denmark, MD  clonazePAM (KLONOPIN) 1 MG tablet Take 1 tablet (1 mg total) by mouth at bedtime. 09/18/15   Clapacs, Jackquline Denmark, MD  docusate sodium (COLACE) 100 MG capsule Take 2 capsules (200 mg total) by mouth 2 (two) times daily. Patient not taking: Reported on 12/29/2015 09/18/15   Clapacs, Jackquline Denmark, MD  finasteride (PROSCAR) 5 MG tablet Take 1 tablet (5 mg total)  by mouth daily. 09/18/15   Clapacs, Jackquline Denmark, MD  furosemide (LASIX) 20 MG tablet Take 20 mg by mouth daily.     [provider]  furosemide (LASIX) 20 MG tablet Take 1 tablet (20 mg total) by mouth daily. 02/03/16   Clapacs, Jackquline Denmark, MD  insulin aspart (NOVOLOG) 100 UNIT/ML injection Inject 0-9 Units into the skin 3 (three) times daily with meals. 02/03/16   Clapacs, Jackquline Denmark, MD  lisinopril (PRINIVIL,ZESTRIL) 20 MG tablet Take 1 tablet (20 mg total) by mouth daily. 02/03/16   Clapacs, Jackquline Denmark, MD  lisinopril (PRINIVIL,ZESTRIL) 40 MG tablet Take 40 mg by mouth daily.    [provider]  metFORMIN (GLUCOPHAGE-XR) 500 MG 24 hr tablet Take 500 mg by mouth 2 (two) times daily.    [provider]    metoprolol tartrate (LOPRESSOR) 25 MG tablet Take 0.5 tablets (12.5 mg total) by mouth 2 (two) times daily. 02/03/16   Clapacs, Jackquline Denmark, MD  mirtazapine (REMERON) 45 MG tablet Take 1 tablet (45 mg total) by mouth at bedtime. 09/18/15   Clapacs, Jackquline Denmark, MD  OLANZapine (ZYPREXA) 15 MG tablet Take 2 tablets (30 mg total) by mouth at bedtime. 09/18/15   Clapacs, Jackquline Denmark, MD  pantoprazole (PROTONIX) 40 MG tablet Take 1 tablet (40 mg total) by mouth daily. 02/03/16   Clapacs, Jackquline Denmark, MD  polyethylene glycol (MIRALAX / GLYCOLAX) packet Take 17 g by mouth daily. Patient not taking: Reported on 12/29/2015 09/18/15   Clapacs, Jackquline Denmark, MD  QUEtiapine (SEROQUEL) 400 MG tablet Take 1 tablet (400 mg total) by mouth at bedtime. 02/03/16   Clapacs, Jackquline Denmark, MD  tamsulosin (FLOMAX) 0.4 MG CAPS capsule Take 1 capsule (0.4 mg total) by mouth daily after supper. 09/18/15   Audery Amel, MD    Physical Exam: Vitals:   03/26/18 2154 03/27/18 0026 03/27/18 0100 03/27/18 0236  BP: (!) 154/88 (!) 155/101 (!) 145/98 (!) 173/91  Pulse: 64 75 74 69  Resp: (!) 22 18 17  (!) 22  Temp:    98.8 F (37.1 C)  TempSrc:      SpO2: 97% 100% 97% 98%  Weight:    99.6 kg (219 lb 9.3 oz)  Height:    6' (1.829 m)      Constitutional: Moderately built and nourished. Vitals:   03/26/18 2154 03/27/18 0026 03/27/18 0100 03/27/18 0236  BP: (!) 154/88 (!) 155/101 (!) 145/98 (!) 173/91  Pulse: 64 75 74 69  Resp: (!) 22 18 17  (!) 22  Temp:    98.8 F (37.1 C)  TempSrc:      SpO2: 97% 100% 97% 98%  Weight:    99.6 kg (219 lb 9.3 oz)  Height:    6' (1.829 m)   Eyes: Anicteric no pallor. ENMT: No discharge from the ears eyes nose or mouth. Neck: No mass felt.  No neck rigidity. Respiratory: No rhonchi or crepitations. Cardiovascular: S1-S2 heard no murmurs appreciated. Abdomen: Soft nontender bowel sounds present. Musculoskeletal: No edema.  No joint effusion. Skin: No rash.  Skin appears warm. Neurologic: Alert awake oriented  to time place and person.  Moves all extremities 5 x 5. Psychiatric: Appears normal.  Normal affect.   Labs on Admission: I have personally reviewed following labs and imaging studies  CBC: Recent Labs  Lab 03/26/18 2013  WBC 7.0  NEUTROABS 5.3  HGB 14.7  HCT 42.9  MCV 87.7  PLT 177   Basic Metabolic Panel: Recent Labs  Lab 03/26/18 2013  NA 137  K 3.8  CL 100*  CO2 26  GLUCOSE 192*  BUN 16  CREATININE 1.07  CALCIUM 9.5   GFR: Estimated Creatinine Clearance: 85.2 mL/min (by C-G formula based on SCr of 1.07 mg/dL). Liver Function Tests: No results for input(s): AST, ALT, ALKPHOS, BILITOT, PROT, ALBUMIN in the last 168 hours. No results for input(s): LIPASE, AMYLASE in the last 168 hours. No results for input(s): AMMONIA in the last 168 hours. Coagulation Profile: No results for input(s): INR, PROTIME in the last 168 hours. Cardiac Enzymes: No results for input(s): CKTOTAL, CKMB, CKMBINDEX, TROPONINI in the last 168 hours. BNP (last 3 results) No results for input(s): PROBNP in the last 8760 hours. HbA1C: No results for input(s): HGBA1C in the last 72 hours. CBG: No results for input(s): GLUCAP in the last 168 hours. Lipid Profile: No results for input(s): CHOL, HDL, LDLCALC, TRIG, CHOLHDL, LDLDIRECT in the last 72 hours. Thyroid Function Tests: No results for input(s): TSH, T4TOTAL, FREET4, T3FREE, THYROIDAB in the last 72 hours. Anemia Panel: No results for input(s): VITAMINB12, FOLATE, FERRITIN, TIBC, IRON, RETICCTPCT in the last 72 hours. Urine analysis:    Component Value Date/Time   COLORURINE YELLOW 03/26/2018 2247   APPEARANCEUR HAZY (A) 03/26/2018 2247   APPEARANCEUR Cloudy 08/01/2014 1614   LABSPEC 1.014 03/26/2018 2247   LABSPEC 1.008 08/01/2014 1614   PHURINE 7.0 03/26/2018 2247   GLUCOSEU 150 (A) 03/26/2018 2247   GLUCOSEU Negative 08/01/2014 1614   HGBUR SMALL (A) 03/26/2018 2247   BILIRUBINUR NEGATIVE 03/26/2018 2247   BILIRUBINUR Negative  08/01/2014 1614   KETONESUR NEGATIVE 03/26/2018 2247   PROTEINUR 100 (A) 03/26/2018 2247   UROBILINOGEN 0.2 07/01/2014 1809   NITRITE POSITIVE (A) 03/26/2018 2247   LEUKOCYTESUR TRACE (A) 03/26/2018 2247   LEUKOCYTESUR Negative 08/01/2014 1614   Sepsis Labs: @LABRCNTIP (procalcitonin:4,lacticidven:4) )No results found for this or any previous visit (from the past 240 hour(s)).   Radiological Exams on Admission: No results found.  EKG: Independently reviewed.  Normal sinus rhythm with acceptable QTC.  Assessment/Plan Principal Problem:   Generalized weakness Active Problems:   Gout   UTI (urinary tract infection)   Severe bipolar I disorder with depression (HCC)   Fall   Diabetes mellitus type 2 in nonobese (HCC)   Pulmonary embolism (HCC)    1. Generalized weakness with fall -we will check orthostatics in a.m.  Check CK levels troponin and TSH levels.  Get physical therapy consult.  Will hold Lasix for now. 2. UTI will place patient on ceftriaxone follow urine cultures.  Probably causing #1. 3. Diabetes mellitus type 2 -we will keep patient on sliding scale coverage and hold metformin while inpatient. 4. History of pulmonary embolism and DVT on apixaban which will be continued. 5. Hypertension uncontrolled on lisinopril and metoprolol and place patient on PRN IV hydralazine.  Closely follow blood pressure trends. 6. History of gout on allopurinol. 7. Sleep apnea on CPAP. 8. Severe bipolar disorder on Zyprexa Seroquel and Klonopin. 9. History of BPH on finasteride.   DVT prophylaxis: Apixaban. Code Status: Full code. Family Communication: Discussed with patient. Disposition Plan: Home. Consults called: Physical therapy. Admission status: Observation.   Eduard Clos MD [facility] Pager [phone].  If 7PM-7AM, please contact night-coverage [email or web address] Password [facility]  03/27/2018, 3:26 AM

## 2018-03-28 DIAGNOSIS — E119 Type 2 diabetes mellitus without complications: Secondary | ICD-10-CM | POA: Diagnosis not present

## 2018-03-28 DIAGNOSIS — F314 Bipolar disorder, current episode depressed, severe, without psychotic features: Secondary | ICD-10-CM | POA: Diagnosis not present

## 2018-03-28 DIAGNOSIS — I2782 Chronic pulmonary embolism: Secondary | ICD-10-CM

## 2018-03-28 DIAGNOSIS — N3000 Acute cystitis without hematuria: Secondary | ICD-10-CM

## 2018-03-28 DIAGNOSIS — R531 Weakness: Secondary | ICD-10-CM | POA: Diagnosis not present

## 2018-03-28 DIAGNOSIS — W19XXXA Unspecified fall, initial encounter: Secondary | ICD-10-CM | POA: Diagnosis not present

## 2018-03-28 LAB — GLUCOSE, CAPILLARY
Glucose-Capillary: 95 mg/dL (ref 65–99)
Glucose-Capillary: 183 mg/dL — ABNORMAL HIGH (ref 65–99)
Glucose-Capillary: 132 mg/dL — ABNORMAL HIGH (ref 65–99)
Glucose-Capillary: 96 mg/dL (ref 65–99)

## 2018-03-28 NOTE — Care Management Obs Status (Signed)
MEDICARE OBSERVATION STATUS NOTIFICATION   Patient Details  Name: ELBERT CHEVERE MRN: [id number] Date of Birth: May 01, 1951   Medicare Observation Status Notification Given:  Yes    Golda Acre, RN 03/28/2018, 10:51 AM

## 2018-03-28 NOTE — Progress Notes (Signed)
PROGRESS NOTE    Christian [name]  IOM:[id number] DOB: [birth year] DOA: 03/26/2018 PCP: Christian James, MD   Brief Narrative:  HPI on 03/27/2018 by Dr. Ponciano Ort [name] is a 67 y.o. 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  Patient states yesterday he was walking to the bathroom and he slid and fell.  Denies hitting his head.  Following the fall patient felt very weak to get up.  He had his cell phone on his hand and called her sister and his sister called EMS and was brought to the ER.  Denies any chest pain shortness of breath palpitation nausea vomiting or diarrhea.  Denies having any new change in medications.  Has not missed his apixaban dose.  Interim history Admitted for UTI and generalized weakness  Assessment & Plan   UTI -UA positive nitrites, trace leukocytes, 6-10 WBC, no bacteria -Urine culture >100K Staphylococcus coagulase-negative -Continue ceftriaxone  Generalized weakness -Possibly secondary to the above -TSH 3.003 -CK total 89 -PT recommended SNF, if patient refuses home health PT  Diabetes mellitus, type 2 -Metformin held, continue insulin sliding scale and CBG monitoring  History of PE/DVT -Continue Eliquis  Essential hypertension -Continue metoprolol, amlodipine, clonidine, hydrochlorothiazide  Gout -Continue allopurinol  Obstructive sleep apnea -Continue CPAP nightly  Bipolar disorder -Continue Depakote, Prozac, Abilify   BPH -Taking finasteride  DVT Prophylaxis Xarelto  Code Status: Full  Family Communication: none at bedside  Disposition Plan: Observation. Dispo possibly home vs SNF. Pending urine culture sensitivities   Consultants none  Procedures  none  Antibiotics   Anti-infectives (From admission, onward)   Start     Dose/Rate Route Frequency Ordered  Stop   03/27/18 2200  cefTRIAXone (ROCEPHIN) 1 g in sodium chloride 0.9 % 100 mL IVPB     1 g 200 mL/hr over 30 Minutes Intravenous Every 24 hours 03/27/18 0820     03/27/18 0000  cefTRIAXone (ROCEPHIN) 1 g in sodium chloride 0.9 % 100 mL IVPB     1 g 200 mL/hr over 30 Minutes Intravenous  Once 03/26/18 2346 03/27/18 0215      Subjective:   Christian [name] seen and examined today.  No complaints this morning.  Denies current chest pain, shortness breath, abdominal pain, nausea vomiting, diarrhea or constipation.  Objective:   Vitals:   03/27/18 1819 03/27/18 2114 03/28/18 0528 03/28/18 0735  BP: (!) 150/85 (!) 141/80 (!) 149/89 (!) 146/85  Pulse:  67 60 67  Resp:  18 18 18   Temp:  99.1 F (37.3 C) 98.2 F (36.8 C)   TempSrc:  Oral    SpO2:  96% 100%   Weight:   100.6 kg (221 lb 12.5 oz)   Height:        Intake/Output Summary (Last 24 hours) at 03/28/2018 1222 Last data filed at 03/28/2018 0600 Gross per 24 hour  Intake 100 ml  Output 1100 ml  Net -1000 ml   Filed Weights   03/26/18 1901 03/27/18 0236 03/28/18 0528  Weight: 99.8 kg (220 lb) 99.6 kg (219 lb 9.3 oz) 100.6 kg (221 lb 12.5 oz)    Exam  General: Well developed, well nourished, NAD, appears stated age  HEENT: NCAT, mucous membranes moist.   Neck: Supple  Cardiovascular: S1 S2 auscultated, no rubs, murmurs or gallops. Regular rate and rhythm.  Respiratory: Clear to auscultation  bilaterally with equal chest rise  Abdomen: Soft, nontender, nondistended, + bowel sounds  Extremities: warm dry without cyanosis clubbing or edema  Neuro: AAOx3, nonfocal  Psych: appropriate, does not speak much   Data Reviewed: I have personally reviewed following labs and imaging studies  CBC: Recent Labs  Lab 03/26/18 2013 03/27/18 0538  WBC 7.0 5.6  NEUTROABS 5.3  --   HGB 14.7 15.1  HCT 42.9 44.2  MCV 87.7 88.8  PLT 177 162   Basic Metabolic Panel: Recent Labs  Lab 03/26/18 2013 03/27/18 0538  NA 137  135  K 3.8 4.0  CL 100* 97*  CO2 26 26  GLUCOSE 192* 138*  BUN 16 16  CREATININE 1.07 1.09  CALCIUM 9.5 9.4   GFR: Estimated Creatinine Clearance: 84.1 mL/min (by C-G formula based on SCr of 1.09 mg/dL). Liver Function Tests: No results for input(s): AST, ALT, ALKPHOS, BILITOT, PROT, ALBUMIN in the last 168 hours. No results for input(s): LIPASE, AMYLASE in the last 168 hours. No results for input(s): AMMONIA in the last 168 hours. Coagulation Profile: No results for input(s): INR, PROTIME in the last 168 hours. Cardiac Enzymes: Recent Labs  Lab 03/27/18 0550  CKTOTAL 89  TROPONINI <0.03   BNP (last 3 results) No results for input(s): PROBNP in the last 8760 hours. HbA1C: No results for input(s): HGBA1C in the last 72 hours. CBG: Recent Labs  Lab 03/27/18 1203 03/27/18 1639 03/27/18 2112 03/28/18 0745 03/28/18 1143  GLUCAP 108* 164* 162* 95 183*   Lipid Profile: No results for input(s): CHOL, HDL, LDLCALC, TRIG, CHOLHDL, LDLDIRECT in the last 72 hours. Thyroid Function Tests: Recent Labs    03/27/18 0800  TSH 3.003   Anemia Panel: No results for input(s): VITAMINB12, FOLATE, FERRITIN, TIBC, IRON, RETICCTPCT in the last 72 hours. Urine analysis:    Component Value Date/Time   COLORURINE YELLOW 03/26/2018 2247   APPEARANCEUR HAZY (A) 03/26/2018 2247   APPEARANCEUR Cloudy 08/01/2014 1614   LABSPEC 1.014 03/26/2018 2247   LABSPEC 1.008 08/01/2014 1614   PHURINE 7.0 03/26/2018 2247   GLUCOSEU 150 (A) 03/26/2018 2247   GLUCOSEU Negative 08/01/2014 1614   HGBUR SMALL (A) 03/26/2018 2247   BILIRUBINUR NEGATIVE 03/26/2018 2247   BILIRUBINUR Negative 08/01/2014 1614   KETONESUR NEGATIVE 03/26/2018 2247   PROTEINUR 100 (A) 03/26/2018 2247   UROBILINOGEN 0.2 07/01/2014 1809   NITRITE POSITIVE (A) 03/26/2018 2247   LEUKOCYTESUR TRACE (A) 03/26/2018 2247   LEUKOCYTESUR Negative 08/01/2014 1614   Sepsis  Labs: @LABRCNTIP (procalcitonin:4,lacticidven:4)  ) Recent Results (from the past 240 hour(s))  Urine culture     Status: Abnormal (Preliminary result)   Collection Time: 03/26/18 10:47 PM  Result Value Ref Range Status   Specimen Description   Final    URINE, CLEAN CATCH Performed at [facility], [address] [place], [place] [address]    Special Requests   Final    NONE Performed at [facility], [address], [place], [place] [address]    Culture (A)  Final    >=100,000 COLONIES/mL STAPHYLOCOCCUS SPECIES (COAGULASE NEGATIVE)   Report Status PENDING  Incomplete      Radiology Studies: No results found.   Scheduled Meds: . amLODipine  10 mg Oral Daily  . ARIPiprazole  10 mg Oral Daily  . cloNIDine  0.2 mg Oral BID  . divalproex  1,000 mg Oral QHS  . FLUoxetine  20 mg Oral Daily  . hydrochlorothiazide  25 mg Oral  Daily  . insulin aspart  0-9 Units Subcutaneous TID WC  . latanoprost  1 drop Right Eye QHS  . metoprolol succinate  100 mg Oral BID  . protein supplement shake  11 oz Oral BID BM  . rivaroxaban  10 mg Oral QPM  . senna  1 tablet Oral BID   Continuous Infusions: . cefTRIAXone (ROCEPHIN)  IV Stopped (03/27/18 2207)     LOS: 0 days   Time Spent in minutes   30 minutes  Christian [name] D.O. on 03/28/2018 at 12:22 PM  Between 7am to 7pm - Pager - [phone]  After 7pm go to [email or web address] - password [email or web address]  And look for the night coverage person covering for me after hours  [facility] Office  [phone]

## 2018-03-29 DIAGNOSIS — N3000 Acute cystitis without hematuria: Secondary | ICD-10-CM | POA: Diagnosis not present

## 2018-03-29 DIAGNOSIS — E119 Type 2 diabetes mellitus without complications: Secondary | ICD-10-CM | POA: Diagnosis not present

## 2018-03-29 DIAGNOSIS — R531 Weakness: Secondary | ICD-10-CM | POA: Diagnosis not present

## 2018-03-29 DIAGNOSIS — W19XXXA Unspecified fall, initial encounter: Secondary | ICD-10-CM

## 2018-03-29 LAB — URINE CULTURE: Culture: >=100000 — AB

## 2018-03-29 LAB — GLUCOSE, CAPILLARY
Glucose-Capillary: 90 mg/dL (ref 65–99)
Glucose-Capillary: 118 mg/dL — ABNORMAL HIGH (ref 65–99)
Glucose-Capillary: 158 mg/dL — ABNORMAL HIGH (ref 65–99)
Glucose-Capillary: 124 mg/dL — ABNORMAL HIGH (ref 65–99)

## 2018-03-29 MED ORDER — CEPHALEXIN 500 MG PO CAPS
500.0000 mg | ORAL_CAPSULE | Freq: Three times a day (TID) | ORAL | Status: DC
Start: 2018-03-29 — End: 2018-03-31
  Administered 2018-03-29 – 2018-03-31 (×6): 500 mg via ORAL
  Filled 2018-03-29 (×6): qty 1

## 2018-03-29 NOTE — Care Management Note (Signed)
Case Management Note  Patient Details  Name: Christian [name] MRN: [id number] Date of Birth: Jul 06, 1951  Subjective/Objective: Awaiting SNf w/passr-CSW already following.                   Action/Plan:d/c SNF.   Expected Discharge Date:                  Expected Discharge Plan:  Skilled Nursing Facility  In-House Referral:  Clinical Social Work  Discharge planning Services  CM Consult  Post Acute Care Choice:    Choice offered to:     DME Arranged:    DME Agency:     HH Arranged:    HH Agency:     Status of Service:  Completed, signed off  If discussed at [employer], dates discussed:    Additional Comments:  Lanier Clam, RN 03/29/2018, 2:24 PM

## 2018-03-29 NOTE — Progress Notes (Signed)
Physical Therapy Treatment Patient Details Name: Christian [name] MRN: [id number] DOB: 1951/08/20 Today's Date: 03/29/2018    History of Present Illness 67 yo male admitted with generalized weaknes, fall, UTI. Hx of bipolar d/o, mood swings, DM, DVTs, PE, gout, CKD, hypotension, bil LE edema    PT Comments    Assisted out or recliner to amb a limited distance.  General Gait Details: decreased amb distance due to increased weakness and gait instability.  Very short shuffled steps and narrow BOS.  Delayed coorective reaction.  HIGH FALL RISK.  Pt will need ST Rehab at SNF   Follow Up Recommendations  SNF     Equipment Recommendations  None recommended by PT    Recommendations for Other Services       Precautions / Restrictions Precautions Precautions: Fall Restrictions Weight Bearing Restrictions: No    Mobility  Bed Mobility               General bed mobility comments: OOB in recliner  Transfers Overall transfer level: Needs assistance Equipment used: Rolling walker (2 wheeled) Transfers: Sit to/from Stand Sit to Stand: Min assist         General transfer comment: 25% VC's on safety with turns and hand placement  Ambulation/Gait Ambulation/Gait assistance: Min assist;Mod assist Ambulation Distance (Feet): 45 Feet Assistive device: Rolling walker (2 wheeled) Gait Pattern/deviations: Step-through pattern;Decreased stride length Gait velocity: decreased x 3    below normal gait speed   General Gait Details: decreased amb distance due to increased weakness and gait instability.  Very short shuffled steps and narrow BOS.  Delayed coorective reaction.  HIGH FALL RISK.   Stairs             Wheelchair Mobility    Modified Rankin (Stroke Patients Only)       Balance                                            Cognition Arousal/Alertness: Awake/alert Behavior During Therapy: WFL for tasks assessed/performed;Flat affect                                    General Comments: a bit soft spoken and slow to respond to questions      Exercises      General Comments        Pertinent Vitals/Pain Pain Assessment: No/denies pain    Home Living                      Prior Function            PT Goals (current goals can now be found in the care plan section) Progress towards PT goals: Progressing toward goals    Frequency    Min 3X/week      PT Plan Current plan remains appropriate    Co-evaluation              AM-PAC PT "6 Clicks" Daily Activity  Outcome Measure  Difficulty turning over in bed (including adjusting bedclothes, sheets and blankets)?: A Lot Difficulty moving from lying on back to sitting on the side of the bed? : A Lot Difficulty sitting down on and standing up from a chair with arms (e.g., wheelchair, bedside commode, etc,.)?: A Lot Help needed moving to and  from a bed to chair (including a wheelchair)?: A Lot Help needed walking in hospital room?: A Lot Help needed climbing 3-5 steps with a railing? : Total 6 Click Score: 11    End of Session Equipment Utilized During Treatment: Gait belt Activity Tolerance: Patient tolerated treatment well Patient left: in chair;with chair alarm set   PT Visit Diagnosis: Muscle weakness (generalized) (M62.81);Difficulty in walking, not elsewhere classified (R26.2);Unsteadiness on feet (R26.81);History of falling (Z91.81)     Time: 6789-3810 PT Time Calculation (min) (ACUTE ONLY): 12 min  Charges:  $Gait Training: 8-22 mins                    G Codes:       Felecia Shelling  PTA [facility]  Acute  Rehab Pager      [phone]

## 2018-03-29 NOTE — [place] FL2 (Signed)
[place] MEDICAID FL2 LEVEL OF CARE SCREENING TOOL     IDENTIFICATION  Patient Name: Christian [name] Birthdate: 09/05/1951 Sex: male Admission Date (Current Location): 03/26/2018  [facility] and [place] Number:  Producer, television/film/video and Address:  [facility],  [address], [place] [address]      Provider Number: [id number]  Attending Physician Name and Address:  Edsel Petrin, DO  Relative Name and Phone Number:       Current Level of Care: Hospital Recommended Level of Care: Skilled Nursing Facility Prior Approval Number:    Date Approved/Denied:   PASRR Number:    Discharge Plan: SNF    Current Diagnoses: Patient Active Problem List   Diagnosis Date Noted  . Generalized weakness 03/27/2018  . Diabetes mellitus type 2 in nonobese (HCC) 03/27/2018  . Pulmonary embolism (HCC) 03/27/2018  . Fall   . Laceration of eyebrow   . Bipolar I disorder, most recent episode depressed, severe w psychosis (HCC) 12/22/2015  . Bipolar affective disorder, depressed, severe, with psychotic behavior (HCC)   . Bipolar I disorder, current or most recent episode depressed, with psychotic features (HCC)   . Bipolar I disorder, most recent episode depressed (HCC)   . Severe bipolar I disorder with depression (HCC)   . Bipolar I disorder, most recent episode depressed, severe with psychotic features (HCC)   . Overdose 09/11/2015  . Acute respiratory failure with hypoxia (HCC) 09/05/2015  . Elevated troponin 09/05/2015  . Somnolence 09/05/2015  . Bipolar depression (HCC) 09/05/2015  . Acute bilateral deep vein thrombosis (DVT) of femoral veins (HCC) 09/05/2015  . Bilateral pulmonary embolism (HCC) 09/02/2015  . Labile hypertension   . Pulmonary embolism with acute cor pulmonale (HCC)   . Dyspnea   . Orthostatic hypotension   . Bipolar disorder, in partial remission, most recent episode depressed (HCC)   . Syncope, near 08/31/2015  . Affective psychosis, bipolar (HCC)    . Bipolar disorder, now depressed (HCC) 08/28/2015  . Bipolar I disorder, severe, current or most recent episode depressed, with psychotic features (HCC)   . Pressure ulcer 07/30/2015  . Protein-calorie malnutrition, severe (HCC) 07/30/2015  . Dehydration 07/29/2015  . Bipolar disorder, current episode depressed, severe, with psychotic features (HCC) 07/09/2015  . Diabetes (HCC) 07/09/2015  . UTI (urinary tract infection) 06/19/2014  . Positive blood culture 06/19/2014  . Bipolar affective disorder, current episode manic with psychotic symptoms (HCC) 06/17/2014  . Type II or unspecified type diabetes mellitus with unspecified complication, uncontrolled 06/02/2014  . Other and unspecified hyperlipidemia 06/02/2014  . 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 Gout 06/02/2014  . Hypertension     Orientation RESPIRATION BLADDER Height & Weight     Self, Time, Situation, Place  Normal Incontinent Weight: 223 lb (101.2 kg) Height:  6' (182.9 cm)  BEHAVIORAL SYMPTOMS/MOOD NEUROLOGICAL BOWEL NUTRITION STATUS        Diet(Heart healthy/ carb modified )  AMBULATORY STATUS COMMUNICATION OF NEEDS Skin   Limited Assist Verbally                         Personal Care Assistance Level of Assistance  Bathing, Feeding, Dressing Bathing Assistance: Limited assistance Feeding assistance: Independent Dressing Assistance: Limited assistance     Functional Limitations Info             SPECIAL CARE FACTORS FREQUENCY  PT (By licensed PT), OT (By licensed OT)  PT Frequency: 5 OT Frequency: 5            Contractures      Additional Factors Info  Code Status, Allergies Code Status Info: Full code  Allergies Info: ZYPREXA OLANZAPINE            Current Medications (03/29/2018):  This is the current hospital active medication list Current Facility-Administered Medications  Medication Dose Route Frequency Provider Last Rate Last Dose   . acetaminophen (TYLENOL) tablet 650 mg  650 mg Oral Q6H PRN Edsel Petrin, DO       Or  . acetaminophen (TYLENOL) suppository 650 mg  650 mg Rectal Q6H PRN Edsel Petrin, DO      . amLODipine (NORVASC) tablet 10 mg  10 mg Oral Daily Edsel Petrin, DO   10 mg at 03/29/18 1042  . ARIPiprazole (ABILIFY) tablet 10 mg  10 mg Oral Daily Edsel Petrin, DO   10 mg at 03/29/18 1050  . cefTRIAXone (ROCEPHIN) 1 g in sodium chloride 0.9 % 100 mL IVPB  1 g Intravenous Q24H Edsel Petrin, DO   Stopped at 03/28/18 2139  . cloNIDine (CATAPRES) tablet 0.2 mg  0.2 mg Oral BID Edsel Petrin, DO   0.2 mg at 03/29/18 1049  . divalproex (DEPAKOTE) DR tablet 1,000 mg  1,000 mg Oral QHS Mikhail, [place], DO   1,000 mg at 03/28/18 2106  . FLUoxetine (PROZAC) capsule 20 mg  20 mg Oral Daily Edsel Petrin, DO   20 mg at 03/29/18 1049  . hydrALAZINE (APRESOLINE) injection 5 mg  5 mg Intravenous Q4H PRN Edsel Petrin, DO   5 mg at 03/29/18 5784  . hydrochlorothiazide (HYDRODIURIL) tablet 25 mg  25 mg Oral Daily Edsel Petrin, DO   25 mg at 03/29/18 1042  . insulin aspart (novoLOG) injection 0-9 Units  0-9 Units Subcutaneous TID WC Mikhail, Nita Sells, DO   1 Units at 03/28/18 1757  . latanoprost (XALATAN) 0.005 % ophthalmic solution 1 drop  1 drop Right Eye QHS Mikhail, [place], DO   1 drop at 03/28/18 2108  . metoprolol succinate (TOPROL-XL) 24 hr tablet 100 mg  100 mg Oral BID Edsel Petrin, DO   100 mg at 03/29/18 1043  . protein supplement (PREMIER PROTEIN) liquid  11 oz Oral BID BM Mikhail, Nita Sells, DO   11 oz at 03/27/18 1332  . rivaroxaban (XARELTO) tablet 10 mg  10 mg Oral QPM Mikhail, [place], DO   10 mg at 03/28/18 1757  . senna (SENOKOT) tablet 8.6 mg  1 tablet Oral BID Edsel Petrin, DO   8.6 mg at 03/29/18 1042     Discharge Medications: Please see discharge summary for a list of discharge medications.  Relevant Imaging Results:  Relevant Lab Results:   Additional  Information SS#:[id number]  Donnie Coffin, LCSW

## 2018-03-29 NOTE — Clinical Social Work Note (Signed)
Clinical Social Work Assessment  Patient Details  Name: Christian [name] MRN: [id number] Date of Birth: 28-Sep-1951  Date of referral:  03/29/18               Reason for consult:  Facility Placement                Permission sought to share information with:  Facility Medical sales representative, Family Supports, Other(ACT Team ) Permission granted to share information::     Name::     Armed forces operational officer ;  Fish farm manager::  [facility] ACT Team   Relationship::  Daughter; Ex-wife; ACT Team Worker(court trueblood)  Contact Information:  [phone];  [phone];  [phone]  Housing/Transportation Living arrangements for the past 2 months:  Apartment Source of Information:  Patient, Other (Comment Required)(ACT Team worker) Patient Interpreter Needed:  None Criminal Activity/Legal Involvement Pertinent to Current Situation/Hospitalization:  No - Comment as needed Significant Relationships:  Adult Children, Merchandiser, retail, Friend(Friend (ex-wife)) Lives with:  Self Do you feel safe going back to the place where you live?  (PT recommending SNF) Need for family participation in patient care:  No (Coment)  Care giving concerns:  Patient from home alone. Patient active with [facility] ACT Team. Patient reported that prior to hospitalization that he was walking around his home with the assistance of a Rolator. Patient reported that he needs assistance with bathing and dressing. Patient reported that his sister and daughter bring him food and groceries. Patient reported that he had multiple falls before coming into the hospital. PT recommending SNF for ST rehab.     Social Worker assessment / plan:  CSW spoke with patient at bedside regarding PT recommendation for SNF, patient reported that he is agreeable. CSW explained SNF placement process and insurance authorization, patient verbalized understanding. Patient granted CSW permission to speak with his daughter,  ex-wife and ACT Team about discharge planning.  CSW spoke with patient's ACT Team staff member (Court Trueblood). Staff reported that they see patient almost daily. Staff reported that he spoke with patient's daughter who provided update about patient's discharge planning. CSW agreed to update staff once patient picks a SNF to discharge to so they can follow up with patient.  CSW completed patient's FL2. CSW started patient's PASRR, currently pending MUST ID [id number]. CSW started patient's insurance authorization.  CSW will follow up with patient's daughter to provide update.   CSW will continue to follow and assist patient with discharge planning.  Employment status:  Disabled (Comment on whether or not currently receiving Disability) Insurance information:  Managed Medicare PT Recommendations:  Skilled Nursing Facility Information / Referral to community resources:  Skilled Nursing Facility  Patient/Family's Response to care:  Patient agreeable to SNF for ST rehab. Patient thanked CSW for assistance.   Patient/Family's Understanding of and Emotional Response to Diagnosis, Current Treatment, and Prognosis:  Patient presented flat and verbalized understanding of PT recommendation for ST rehab. Patient verbalized plan to dc to SNF for ST rehab.   Emotional Assessment Appearance:  Appears stated age Attitude/Demeanor/Rapport:  Other(Cooperative) Affect (typically observed):  Flat Orientation:  Oriented to Self, Oriented to Place, Oriented to  Time, Oriented to Situation Alcohol / Substance use:  Not Applicable Psych involvement (Current and /or in the community):  Outpatient Provider(Patient has an ACT Team ([facility]))  Discharge Needs  Concerns to be addressed:  Care Coordination Readmission within the last 30 days:  No Current discharge risk:  Physical Impairment, Lives alone Barriers to Discharge:  Designer, jewellery Psychologist, educational), Insurance Authorization   Antionette Poles, LCSW 03/29/2018, 4:17 PM

## 2018-03-29 NOTE — Progress Notes (Signed)
PROGRESS NOTE    Christian [name]  WUJ:[id number] DOB: [birth year] DOA: 03/26/2018 PCP: [name], MD   Brief Narrative:  HPI on 03/27/2018 by Dr. Ponciano Ort Mages is a 67 y.o. 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  Patient states yesterday he was walking to the bathroom and he slid and fell.  Denies hitting his head.  Following the fall patient felt very weak to get up.  He had his cell phone on his hand and called her sister and his sister called EMS and was brought to the ER.  Denies any chest pain shortness of breath palpitation nausea vomiting or diarrhea.  Denies having any new change in medications.  Has not missed his apixaban dose.  Interim history Admitted for UTI and generalized weakness  Assessment & Plan   UTI -UA positive nitrites, trace leukocytes, 6-10 WBC, no bacteria -Urine culture >100K Staphylococcus coagulase-negative; pansensitive -Was placed on ceftriaxone, transitioned to keflex  Generalized weakness -Possibly secondary to the above -TSH 3.003 -CK total 89 -PT recommended SNF, if patient refuses home health PT -discussed with patient, agrees to SNF; pending PASSR  Diabetes mellitus, type 2 -Metformin held, continue insulin sliding scale and CBG monitoring  History of PE/DVT -Continue Eliquis  Essential hypertension -Continue metoprolol, amlodipine, clonidine, hydrochlorothiazide  Gout -Continue allopurinol  Obstructive sleep apnea -Continue CPAP nightly  Bipolar disorder -Continue Depakote, Prozac, Abilify   BPH -Taking finasteride  DVT Prophylaxis Xarelto  Code Status: Full  Family Communication: none at bedside  Disposition Plan: Observation. Dispo PASSR for SNF.  Consultants none  Procedures  none  Antibiotics   Anti-infectives (From admission,  onward)   Start     Dose/Rate Route Frequency Ordered Stop   03/29/18 2000  cephALEXin (KEFLEX) capsule 500 mg     500 mg Oral Every 8 hours 03/29/18 1354     03/27/18 2200  cefTRIAXone (ROCEPHIN) 1 g in sodium chloride 0.9 % 100 mL IVPB  Status:  Discontinued     1 g 200 mL/hr over 30 Minutes Intravenous Every 24 hours 03/27/18 0820 03/29/18 1354   03/27/18 0000  cefTRIAXone (ROCEPHIN) 1 g in sodium chloride 0.9 % 100 mL IVPB     1 g 200 mL/hr over 30 Minutes Intravenous  Once 03/26/18 2346 03/27/18 0215      Subjective:   Christian [name] seen and examined today. Has no complaints today. Denies chest pain, shortness of breath, abdominal pain, nausea vomiting, diarrhea constipation.  Objective:   Vitals:   03/29/18 0555 03/29/18 0629 03/29/18 1000 03/29/18 1302  BP: (!) 168/97  137/76 122/74  Pulse: (!) 58  67 67  Resp: 18   19  Temp:    99.1 F (37.3 C)  TempSrc:    Oral  SpO2: 99%   99%  Weight:  101.2 kg (223 lb)    Height:        Intake/Output Summary (Last 24 hours) at 03/29/2018 1358 Last data filed at 03/29/2018 1302 Gross per 24 hour  Intake 1300 ml  Output 1750 ml  Net -450 ml   Filed Weights   03/27/18 0236 03/28/18 0528 03/29/18 0629  Weight: 99.6 kg (219 lb 9.3 oz) 100.6 kg (221 lb 12.5 oz) 101.2 kg (223 lb)   Exam  General: Well developed, well nourished, NAD, appears stated age  HEENT: NCAT, mucous  membranes moist.   Neck: Supple  Cardiovascular: S1 S2 auscultated, no murmur, RRR  Respiratory: Clear to auscultation bilaterally with equal chest rise  Abdomen: Soft, nontender, nondistended, + bowel sounds  Extremities: warm dry without cyanosis clubbing or edema  Neuro: AAOx3, nonfocal  Psych: Flat, however appropriate   Data Reviewed: I have personally reviewed following labs and imaging studies  CBC: Recent Labs  Lab 03/26/18 2013 03/27/18 0538  WBC 7.0 5.6  NEUTROABS 5.3  --   HGB 14.7 15.1  HCT 42.9 44.2  MCV 87.7 88.8  PLT 177  162   Basic Metabolic Panel: Recent Labs  Lab 03/26/18 2013 03/27/18 0538  NA 137 135  K 3.8 4.0  CL 100* 97*  CO2 26 26  GLUCOSE 192* 138*  BUN 16 16  CREATININE 1.07 1.09  CALCIUM 9.5 9.4   GFR: Estimated Creatinine Clearance: 84.3 mL/min (by C-G formula based on SCr of 1.09 mg/dL). Liver Function Tests: No results for input(s): AST, ALT, ALKPHOS, BILITOT, PROT, ALBUMIN in the last 168 hours. No results for input(s): LIPASE, AMYLASE in the last 168 hours. No results for input(s): AMMONIA in the last 168 hours. Coagulation Profile: No results for input(s): INR, PROTIME in the last 168 hours. Cardiac Enzymes: Recent Labs  Lab 03/27/18 0550  CKTOTAL 89  TROPONINI <0.03   BNP (last 3 results) No results for input(s): PROBNP in the last 8760 hours. HbA1C: No results for input(s): HGBA1C in the last 72 hours. CBG: Recent Labs  Lab 03/28/18 1143 03/28/18 1619 03/28/18 2104 03/29/18 0724 03/29/18 1112  GLUCAP 183* 132* 96 90 118*   Lipid Profile: No results for input(s): CHOL, HDL, LDLCALC, TRIG, CHOLHDL, LDLDIRECT in the last 72 hours. Thyroid Function Tests: Recent Labs    03/27/18 0800  TSH 3.003   Anemia Panel: No results for input(s): VITAMINB12, FOLATE, FERRITIN, TIBC, IRON, RETICCTPCT in the last 72 hours. Urine analysis:    Component Value Date/Time   COLORURINE YELLOW 03/26/2018 2247   APPEARANCEUR HAZY (A) 03/26/2018 2247   APPEARANCEUR Cloudy 08/01/2014 1614   LABSPEC 1.014 03/26/2018 2247   LABSPEC 1.008 08/01/2014 1614   PHURINE 7.0 03/26/2018 2247   GLUCOSEU 150 (A) 03/26/2018 2247   GLUCOSEU Negative 08/01/2014 1614   HGBUR SMALL (A) 03/26/2018 2247   BILIRUBINUR NEGATIVE 03/26/2018 2247   BILIRUBINUR Negative 08/01/2014 1614   KETONESUR NEGATIVE 03/26/2018 2247   PROTEINUR 100 (A) 03/26/2018 2247   UROBILINOGEN 0.2 07/01/2014 1809   NITRITE POSITIVE (A) 03/26/2018 2247   LEUKOCYTESUR TRACE (A) 03/26/2018 2247   LEUKOCYTESUR Negative  08/01/2014 1614   Sepsis Labs: @LABRCNTIP (procalcitonin:4,lacticidven:4)  ) Recent Results (from the past 240 hour(s))  Urine culture     Status: Abnormal   Collection Time: 03/26/18 10:47 PM  Result Value Ref Range Status   Specimen Description   Final    URINE, CLEAN CATCH Performed at [facility], [address], [place], [place] [address]    Special Requests   Final    NONE Performed at [facility], [address], [place], [place] [address]    Culture (A)  Final    >=100,000 COLONIES/mL STAPHYLOCOCCUS SPECIES (COAGULASE NEGATIVE)   Report Status 03/29/2018 FINAL  Final   Organism ID, Bacteria STAPHYLOCOCCUS SPECIES (COAGULASE NEGATIVE) (A)  Final      Susceptibility   Staphylococcus species (coagulase negative) - MIC*    CIPROFLOXACIN <=0.5 SENSITIVE Sensitive     GENTAMICIN <=0.5 SENSITIVE Sensitive  NITROFURANTOIN <=16 SENSITIVE Sensitive     OXACILLIN <=0.25 SENSITIVE Sensitive     TETRACYCLINE <=1 SENSITIVE Sensitive     VANCOMYCIN 1 SENSITIVE Sensitive     TRIMETH/SULFA <=10 SENSITIVE Sensitive     CLINDAMYCIN <=0.25 SENSITIVE Sensitive     RIFAMPIN <=0.5 SENSITIVE Sensitive     Inducible Clindamycin NEGATIVE Sensitive     * >=100,000 COLONIES/mL STAPHYLOCOCCUS SPECIES (COAGULASE NEGATIVE)      Radiology Studies: No results found.   Scheduled Meds: . amLODipine  10 mg Oral Daily  . ARIPiprazole  10 mg Oral Daily  . cephALEXin  500 mg Oral Q8H  . cloNIDine  0.2 mg Oral BID  . divalproex  1,000 mg Oral QHS  . FLUoxetine  20 mg Oral Daily  . hydrochlorothiazide  25 mg Oral Daily  . insulin aspart  0-9 Units Subcutaneous TID WC  . latanoprost  1 drop Right Eye QHS  . metoprolol succinate  100 mg Oral BID  . protein supplement shake  11 oz Oral BID BM  . rivaroxaban  10 mg Oral QPM  . senna  1 tablet Oral BID   Continuous Infusions:    LOS: 0 days   Time Spent in minutes   30 minutes  Garland Hincapie D.O.  on 03/29/2018 at 1:58 PM  Between 7am to 7pm - Pager - [phone]  After 7pm go to [email or web address] - password [email or web address]  And look for the night coverage person covering for me after hours  [facility] Office  [phone]

## 2018-03-29 NOTE — Progress Notes (Signed)
Pt states that he is not ready to go on CPAP after he asked RT to come back at 2200.  Pt states that he will notify RT when ready.  RT to monitor and assess as needed.

## 2018-03-30 DIAGNOSIS — N3000 Acute cystitis without hematuria: Secondary | ICD-10-CM | POA: Diagnosis not present

## 2018-03-30 DIAGNOSIS — E119 Type 2 diabetes mellitus without complications: Secondary | ICD-10-CM | POA: Diagnosis not present

## 2018-03-30 DIAGNOSIS — R531 Weakness: Secondary | ICD-10-CM | POA: Diagnosis not present

## 2018-03-30 DIAGNOSIS — W19XXXA Unspecified fall, initial encounter: Secondary | ICD-10-CM | POA: Diagnosis not present

## 2018-03-30 LAB — GLUCOSE, CAPILLARY
Glucose-Capillary: 96 mg/dL (ref 65–99)
Glucose-Capillary: 150 mg/dL — ABNORMAL HIGH (ref 65–99)
Glucose-Capillary: 111 mg/dL — ABNORMAL HIGH (ref 65–99)
Glucose-Capillary: 121 mg/dL — ABNORMAL HIGH (ref 65–99)

## 2018-03-30 NOTE — Progress Notes (Signed)
Physical Therapy Treatment Patient Details Name: Christian [name] MRN: [id number] DOB: 16-May-1951 Today's Date: 03/30/2018    History of Present Illness 67 yo male admitted with generalized weaknes, fall, UTI. Hx of bipolar d/o, mood swings, DM, DVTs, PE, gout, CKD, hypotension, bil LE edema    PT Comments    Pt progressing with mobility, he tolerated increased ambulation distance of 120' with RW today. Ambulation was more steady today. Instructed pt in independent UE/LE exercises for strengthening.    Follow Up Recommendations  SNF     Equipment Recommendations  None recommended by PT    Recommendations for Other Services       Precautions / Restrictions Precautions Precautions: Fall Precaution Comments: pt reports 2-3 falls in past 1 year Restrictions Weight Bearing Restrictions: No    Mobility  Bed Mobility               General bed mobility comments: OOB in recliner  Transfers Overall transfer level: Needs assistance Equipment used: Rolling walker (2 wheeled) Transfers: Sit to/from Stand Sit to Stand: Supervision         General transfer comment: supervision for safety  Ambulation/Gait Ambulation/Gait assistance: Min guard Ambulation Distance (Feet): 120 Feet Assistive device: Rolling walker (2 wheeled) Gait Pattern/deviations: Step-through pattern;Decreased stride length;Narrow base of support Gait velocity: decr   General Gait Details: VCs to increase step length, no loss of balance, pt more steady today   Stairs             Wheelchair Mobility    Modified Rankin (Stroke Patients Only)       Balance Overall balance assessment: Needs assistance;History of Falls   Sitting balance-Leahy Scale: Good     Standing balance support: Bilateral upper extremity supported Standing balance-Leahy Scale: Fair                              Cognition Arousal/Alertness: Awake/alert Behavior During Therapy: WFL for tasks  assessed/performed;Flat affect                                   General Comments: a bit soft spoken and slow to respond to questions      Exercises General Exercises - Lower Extremity Ankle Circles/Pumps: AROM;Both;10 reps;Supine Heel Slides: AAROM;Both;10 reps;Supine Shoulder Exercises Shoulder Flexion: AROM;Both;10 reps;Seated    General Comments        Pertinent Vitals/Pain Pain Assessment: No/denies pain    Home Living                      Prior Function            PT Goals (current goals can now be found in the care plan section) Acute Rehab PT Goals Patient Stated Goal: likes to watch tv PT Goal Formulation: With patient Time For Goal Achievement: 04/10/18 Potential to Achieve Goals: Good Progress towards PT goals: Progressing toward goals    Frequency    Min 3X/week      PT Plan Current plan remains appropriate    Co-evaluation              AM-PAC PT "6 Clicks" Daily Activity  Outcome Measure  Difficulty turning over in bed (including adjusting bedclothes, sheets and blankets)?: A Lot Difficulty moving from lying on back to sitting on the side of the bed? : A Lot Difficulty sitting down  on and standing up from a chair with arms (e.g., wheelchair, bedside commode, etc,.)?: A Little Help needed moving to and from a bed to chair (including a wheelchair)?: A Little Help needed walking in hospital room?: A Little Help needed climbing 3-5 steps with a railing? : A Lot 6 Click Score: 15    End of Session Equipment Utilized During Treatment: Gait belt Activity Tolerance: Patient tolerated treatment well Patient left: in chair;with chair alarm set Nurse Communication: Mobility status PT Visit Diagnosis: Muscle weakness (generalized) (M62.81);Difficulty in walking, not elsewhere classified (R26.2);Unsteadiness on feet (R26.81);History of falling (Z91.81)     Time: 1610-9604 PT Time Calculation (min) (ACUTE ONLY): 12  min  Charges:  $Gait Training: 8-22 mins                    G Codes:          Ralene Bathe Kistler 03/30/2018, 1:44 PM [phone]

## 2018-03-30 NOTE — Progress Notes (Signed)
CSW following to assist with discharge planning to SNF.  Patient selected [facility] SN[facility].  CSW contacted [facility] and spoke with admissions staff Tresa Endo, staff confirmed patient's bed offer.  CSW updated patient's insurance authorization. Patient received insurance authorization. Reference ID [id number]; Start Date 03/31/2018; Next Review Date 04/02/2018; [facility]; 550 therapy minutes/week.   Patient was evaluated on 03/30/18 for PASRR.Patient's PASRR pending ([facility] evaluation).   CSW updated patient's daughter.  CSW will continue to follow and assist with discharge planning.  Celso Sickle, [place] Clinical Social Worker [facility] Cell#: [phone]

## 2018-03-30 NOTE — Progress Notes (Signed)
PROGRESS NOTE    Christian [name]  UJW:[id number] DOB: 1951/07/12 DOA: 03/26/2018 PCP: [name], MD   Brief Narrative:  HPI on 03/27/2018 by Dr. Ponciano Ort Caravello is a 67 y.o. 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  Patient states yesterday he was walking to the bathroom and he slid and fell.  Denies hitting his head.  Following the fall patient felt very weak to get up.  He had his cell phone on his hand and called her sister and his sister called EMS and was brought to the ER.  Denies any chest pain shortness of breath palpitation nausea vomiting or diarrhea.  Denies having any new change in medications.  Has not missed his apixaban dose.  Interim history Admitted for UTI and generalized weakness. Pending PASSR  Assessment & Plan   UTI -UA positive nitrites, trace leukocytes, 6-10 WBC, no bacteria -Urine culture >100K Staphylococcus coagulase-negative; pansensitive -Was placed on ceftriaxone, transitioned to keflex  Generalized weakness -Possibly secondary to the above -TSH 3.003 -CK total 89 -PT recommended SNF, if patient refuses home health PT -discussed with patient, agrees to SNF; pending PASSR  Diabetes mellitus, type 2 -Metformin held, continue insulin sliding scale and CBG monitoring  History of PE/DVT -Continue Eliquis  Essential hypertension -Continue metoprolol, amlodipine, clonidine, hydrochlorothiazide  Gout -Continue allopurinol  Obstructive sleep apnea -Continue CPAP nightly  Bipolar disorder -Continue Depakote, Prozac, Abilify   BPH -Taking finasteride  DVT Prophylaxis Xarelto  Code Status: Full  Family Communication: none at bedside  Disposition Plan: Observation. Dispo PASSR for SNF.  Consultants none  Procedures  none  Antibiotics   Anti-infectives (From  admission, onward)   Start     Dose/Rate Route Frequency Ordered Stop   03/29/18 2000  cephALEXin (KEFLEX) capsule 500 mg     500 mg Oral Every 8 hours 03/29/18 1354     03/27/18 2200  cefTRIAXone (ROCEPHIN) 1 g in sodium chloride 0.9 % 100 mL IVPB  Status:  Discontinued     1 g 200 mL/hr over 30 Minutes Intravenous Every 24 hours 03/27/18 0820 03/29/18 1354   03/27/18 0000  cefTRIAXone (ROCEPHIN) 1 g in sodium chloride 0.9 % 100 mL IVPB     1 g 200 mL/hr over 30 Minutes Intravenous  Once 03/26/18 2346 03/27/18 0215      Subjective:   Christian [name] seen and examined today.  Denies current chest pain, shortness breath, abdominal pain, nausea vomiting, diarrhea constipation, dizziness or headache.  Objective:   Vitals:   03/29/18 2238 03/30/18 0500 03/30/18 1055 03/30/18 1336  BP:   (!) 141/85 121/73  Pulse:    61  Resp: 16   18  Temp:    98 F (36.7 C)  TempSrc:    Oral  SpO2:    100%  Weight:  100.3 kg (221 lb 1.9 oz)    Height:        Intake/Output Summary (Last 24 hours) at 03/30/2018 1400 Last data filed at 03/30/2018 1335 Gross per 24 hour  Intake 720 ml  Output 1200 ml  Net -480 ml   Filed Weights   03/28/18 0528 03/29/18 0629 03/30/18 0500  Weight: 100.6 kg (221 lb 12.5 oz) 101.2 kg (223 lb) 100.3 kg (221 lb 1.9 oz)   Exam  General: Well developed, well nourished, NAD  HEENT: NCAT, mucous  membranes moist.   Neck: Supple  Cardiovascular: S1 S2 auscultated, no rubs, murmurs or gallops. Regular rate and rhythm.  Respiratory: Clear to auscultation bilaterally with equal chest rise  Abdomen: Soft, nontender, nondistended, + bowel sounds  Extremities: warm dry without cyanosis clubbing or edema  Neuro: AAOx3, nonfocal  Psych: Flat, however appropriate  Data Reviewed: I have personally reviewed following labs and imaging studies  CBC: Recent Labs  Lab 03/26/18 2013 03/27/18 0538  WBC 7.0 5.6  NEUTROABS 5.3  --   HGB 14.7 15.1  HCT 42.9 44.2  MCV  87.7 88.8  PLT 177 162   Basic Metabolic Panel: Recent Labs  Lab 03/26/18 2013 03/27/18 0538  NA 137 135  K 3.8 4.0  CL 100* 97*  CO2 26 26  GLUCOSE 192* 138*  BUN 16 16  CREATININE 1.07 1.09  CALCIUM 9.5 9.4   GFR: Estimated Creatinine Clearance: 84 mL/min (by C-G formula based on SCr of 1.09 mg/dL). Liver Function Tests: No results for input(s): AST, ALT, ALKPHOS, BILITOT, PROT, ALBUMIN in the last 168 hours. No results for input(s): LIPASE, AMYLASE in the last 168 hours. No results for input(s): AMMONIA in the last 168 hours. Coagulation Profile: No results for input(s): INR, PROTIME in the last 168 hours. Cardiac Enzymes: Recent Labs  Lab 03/27/18 0550  CKTOTAL 89  TROPONINI <0.03   BNP (last 3 results) No results for input(s): PROBNP in the last 8760 hours. HbA1C: No results for input(s): HGBA1C in the last 72 hours. CBG: Recent Labs  Lab 03/29/18 1112 03/29/18 1640 03/29/18 2057 03/30/18 0722 03/30/18 1152  GLUCAP 118* 158* 124* 96 150*   Lipid Profile: No results for input(s): CHOL, HDL, LDLCALC, TRIG, CHOLHDL, LDLDIRECT in the last 72 hours. Thyroid Function Tests: No results for input(s): TSH, T4TOTAL, FREET4, T3FREE, THYROIDAB in the last 72 hours. Anemia Panel: No results for input(s): VITAMINB12, FOLATE, FERRITIN, TIBC, IRON, RETICCTPCT in the last 72 hours. Urine analysis:    Component Value Date/Time   COLORURINE YELLOW 03/26/2018 2247   APPEARANCEUR HAZY (A) 03/26/2018 2247   APPEARANCEUR Cloudy 08/01/2014 1614   LABSPEC 1.014 03/26/2018 2247   LABSPEC 1.008 08/01/2014 1614   PHURINE 7.0 03/26/2018 2247   GLUCOSEU 150 (A) 03/26/2018 2247   GLUCOSEU Negative 08/01/2014 1614   HGBUR SMALL (A) 03/26/2018 2247   BILIRUBINUR NEGATIVE 03/26/2018 2247   BILIRUBINUR Negative 08/01/2014 1614   KETONESUR NEGATIVE 03/26/2018 2247   PROTEINUR 100 (A) 03/26/2018 2247   UROBILINOGEN 0.2 07/01/2014 1809   NITRITE POSITIVE (A) 03/26/2018 2247    LEUKOCYTESUR TRACE (A) 03/26/2018 2247   LEUKOCYTESUR Negative 08/01/2014 1614   Sepsis Labs: @LABRCNTIP (procalcitonin:4,lacticidven:4)  ) Recent Results (from the past 240 hour(s))  Urine culture     Status: Abnormal   Collection Time: 03/26/18 10:47 PM  Result Value Ref Range Status   Specimen Description   Final    URINE, CLEAN CATCH Performed at [facility], [address], [place], [place] [address]    Special Requests   Final    NONE Performed at [facility], [address] [place], [place] [address]    Culture (A)  Final    >=100,000 COLONIES/mL STAPHYLOCOCCUS SPECIES (COAGULASE NEGATIVE)   Report Status 03/29/2018 FINAL  Final   Organism ID, Bacteria STAPHYLOCOCCUS SPECIES (COAGULASE NEGATIVE) (A)  Final      Susceptibility   Staphylococcus species (coagulase negative) - MIC*    CIPROFLOXACIN <=0.5 SENSITIVE Sensitive  GENTAMICIN <=0.5 SENSITIVE Sensitive     NITROFURANTOIN <=16 SENSITIVE Sensitive     OXACILLIN <=0.25 SENSITIVE Sensitive     TETRACYCLINE <=1 SENSITIVE Sensitive     VANCOMYCIN 1 SENSITIVE Sensitive     TRIMETH/SULFA <=10 SENSITIVE Sensitive     CLINDAMYCIN <=0.25 SENSITIVE Sensitive     RIFAMPIN <=0.5 SENSITIVE Sensitive     Inducible Clindamycin NEGATIVE Sensitive     * >=100,000 COLONIES/mL STAPHYLOCOCCUS SPECIES (COAGULASE NEGATIVE)      Radiology Studies: No results found.   Scheduled Meds: . amLODipine  10 mg Oral Daily  . ARIPiprazole  10 mg Oral Daily  . cephALEXin  500 mg Oral Q8H  . cloNIDine  0.2 mg Oral BID  . divalproex  1,000 mg Oral QHS  . FLUoxetine  20 mg Oral Daily  . hydrochlorothiazide  25 mg Oral Daily  . insulin aspart  0-9 Units Subcutaneous TID WC  . latanoprost  1 drop Right Eye QHS  . metoprolol succinate  100 mg Oral BID  . protein supplement shake  11 oz Oral BID BM  . rivaroxaban  10 mg Oral QPM  . senna  1 tablet Oral BID   Continuous Infusions:    LOS: 0 days    Time Spent in minutes   30 minutes  Seddrick Flax D.O. on 03/30/2018 at 2:00 PM  Between 7am to 7pm - Pager - [phone]  After 7pm go to [email or web address] - password [email or web address]  And look for the night coverage person covering for me after hours  [facility] Office  [phone]

## 2018-03-30 NOTE — Progress Notes (Signed)
CSW following to assist with discharge planning to SNF.  Patient's insurance authorization pending.   Patient's PASRR pending, QMHP assignment. Patient awaiting in person evaluation to receive PASRR.  CSW provided patient with update and bed offers.  CSW provided patient's daughter [name]) with update and bed offers.  CSW will continue to follow and assist with discharge planning.  Barriers to discharge: Insurance authorization; PASRR  Celso Sickle, [place] Clinical Social Worker [facility] Cell#: [phone]

## 2018-03-31 DIAGNOSIS — W19XXXA Unspecified fall, initial encounter: Secondary | ICD-10-CM | POA: Diagnosis not present

## 2018-03-31 DIAGNOSIS — N3000 Acute cystitis without hematuria: Secondary | ICD-10-CM | POA: Diagnosis not present

## 2018-03-31 DIAGNOSIS — E119 Type 2 diabetes mellitus without complications: Secondary | ICD-10-CM | POA: Diagnosis not present

## 2018-03-31 DIAGNOSIS — R531 Weakness: Secondary | ICD-10-CM | POA: Diagnosis not present

## 2018-03-31 LAB — GLUCOSE, CAPILLARY
Glucose-Capillary: 90 mg/dL (ref 65–99)
Glucose-Capillary: 245 mg/dL — ABNORMAL HIGH (ref 65–99)

## 2018-03-31 MED ORDER — CEPHALEXIN 500 MG PO CAPS: 500.0000 mg | ORAL_CAPSULE | Freq: Three times a day (TID) | ORAL | 0 refills | Status: AC

## 2018-03-31 MED ORDER — PREMIER PROTEIN SHAKE: 11.0000 [oz_av] | Freq: Two times a day (BID) | ORAL | 0 refills | Status: DC

## 2018-03-31 NOTE — Discharge Instructions (Signed)
Urinary Tract Infection, Adult  A urinary tract infection (UTI) is an infection of any part of the urinary tract, which includes the kidneys, ureters, bladder, and urethra. These organs make, store, and get rid of urine in the body. UTI can be a bladder infection (cystitis) or kidney infection (pyelonephritis).  What are the causes?  This infection may be caused by fungi, viruses, or bacteria. Bacteria are the most common cause of UTIs. This condition can also be caused by repeated incomplete emptying of the bladder during urination.  What increases the risk?  This condition is more likely to develop if:   You ignore your need to urinate or hold urine for long periods of time.   You do not empty your bladder completely during urination.   You wipe back to front after urinating or having a bowel movement, if you are male.   You are uncircumcised, if you are male.   You are constipated.   You have a urinary catheter that stays in place (indwelling).   You have a weak defense (immune) system.   You have a medical condition that affects your bowels, kidneys, or bladder.   You have diabetes.   You take antibiotic medicines frequently or for long periods of time, and the antibiotics no longer work well against certain types of infections (antibiotic resistance).   You take medicines that irritate your urinary tract.   You are exposed to chemicals that irritate your urinary tract.   You are male.    What are the signs or symptoms?  Symptoms of this condition include:   Fever.   Frequent urination or passing small amounts of urine frequently.   Needing to urinate urgently.   Pain or burning with urination.   Urine that smells bad or unusual.   Cloudy urine.   Pain in the lower abdomen or back.   Trouble urinating.   Blood in the urine.   Vomiting or being less hungry than normal.   Diarrhea or abdominal pain.   Vaginal discharge, if you are male.    How is this diagnosed?  This condition is  diagnosed with a medical history and physical exam. You will also need to provide a urine sample to test your urine. Other tests may be done, including:   Blood tests.   Sexually transmitted disease (STD) testing.    If you have had more than one UTI, a cystoscopy or imaging studies may be done to determine the cause of the infections.  How is this treated?  Treatment for this condition often includes a combination of two or more of the following:   Antibiotic medicine.   Other medicines to treat less common causes of UTI.   Over-the-counter medicines to treat pain.   Drinking enough water to stay hydrated.    Follow these instructions at home:   Take over-the-counter and prescription medicines only as told by your health care provider.   If you were prescribed an antibiotic, take it as told by your health care provider. Do not stop taking the antibiotic even if you start to feel better.   Avoid alcohol, caffeine, tea, and carbonated beverages. They can irritate your bladder.   Drink enough fluid to keep your urine clear or pale yellow.   Keep all follow-up visits as told by your health care provider. This is important.   Make sure to:  ? Empty your bladder often and completely. Do not hold urine for long periods of time.  ?   Empty your bladder before and after sex.  ? Wipe from front to back after a bowel movement if you are male. Use each tissue one time when you wipe.  Contact a health care provider if:   You have back pain.   You have a fever.   You feel nauseous or vomit.   Your symptoms do not get better after 3 days.   Your symptoms go away and then return.  Get help right away if:   You have severe back pain or lower abdominal pain.   You are vomiting and cannot keep down any medicines or water.  This information is not intended to replace advice given to you by your health care provider. Make sure you discuss any questions you have with your health care provider.  Document Released:  08/25/2005 Document Revised: 04/28/2016 Document Reviewed: 10/06/2015  Elsevier Interactive Patient Education  2018 Elsevier Inc[employer]

## 2018-03-31 NOTE — Discharge Summary (Addendum)
Physician Discharge Summary  Christian [name] ZOX:[id number] DOB: 14-Jan-1951 DOA: 03/26/2018  PCP: [name], MD  Admit date: 03/26/2018 Discharge date: 03/31/2018  Time spent: 45 minutes  Recommendations for Outpatient Follow-up:  Patient will be discharged to skilled nursing facility, continue physical and occupational therapy.  Patient will need to follow up with primary care provider within one week of discharge.  Patient should continue medications as prescribed.  Patient should follow a heart healthy/carb modified diet.   Discharge Diagnoses:  UTI Generalized weakness Diabetes mellitus, type 2 History of PE/DVT Essential hypertension Gout Obstructive sleep apnea Bipolar disorder BPH  Discharge Condition: Stable  Diet recommendation: heart healthy/carb modified  Filed Weights   03/29/18 0629 03/30/18 0500 03/31/18 0500  Weight: 101.2 kg (223 lb) 100.3 kg (221 lb 1.9 oz) 98.6 kg (217 lb 6 oz)    History of present illness:  on 03/27/2018 by Dr. Ponciano Ort Matieris a 67 y.o.malewithhistory of pulmonary embolism/DVT on apixaban, bipolar disorder previously requiring ECT, gout, BPH, sleep apnea, diabetes mellitus was brought to the ER after patient had a fall and weakness. Patient states yesterday he was walking to the bathroom and he slid and fell. Denies hitting his head. Following the fall patient felt very weak to get up. He had his cell phone on his hand and called her sister and his sister called EMS and was brought to the ER. Denies any chest pain shortness of breath palpitation nausea vomiting or diarrhea. Denies having any new change in medications. Has not missed his apixaban dose.  Hospital Course:  UTI -UA positive nitrites, trace leukocytes, 6-10 WBC, no bacteria -Urine culture >100K Staphylococcus coagulase-negative; pansensitive -Was placed on ceftriaxone, transitioned to keflex- continue for additional 3 days  Generalized  weakness -Possibly secondary to the above -TSH 3.003 -CK total 89 -PT recommended SNF, if patient refuses home health PT -discussed with patient, agrees to SNF  Diabetes mellitus, type 2 -Metformin held, however continue upon discharge  History of PE/DVT -Continue Eliquis  Essential hypertension -Continue metoprolol, amlodipine, clonidine, hydrochlorothiazide  Gout -Continue allopurinol  Obstructive sleep apnea -Continue CPAP nightly  Bipolar disorder -Continue Depakote, Prozac, Abilify   BPH -Continue finasteride  Procedures: None  Consultations: none  Discharge Exam: Vitals:   03/30/18 2110 03/31/18 0626  BP: (!) 148/91 (!) 155/88  Pulse: 67 63  Resp: 18 20  Temp: 98.7 F (37.1 C) 98.6 F (37 C)  SpO2: 100% 100%     General: Well developed, well nourished, NAD, appears stated age  HEENT: NCAT, mucous membranes moist.  Neck: Supple  Cardiovascular: S1 S2 auscultated, RRR, no murmur  Respiratory: Clear to auscultation bilaterally with equal chest rise  Abdomen: Soft, nontender, nondistended, + bowel sounds  Extremities: warm dry without cyanosis clubbing or edema  Neuro: AAOx3, nonfocal  Psych: Flat, however appropriate  Discharge Instructions Discharge Instructions    Discharge instructions   Complete by:  As directed    Patient will be discharged to skilled nursing facility, continue physical and occupational therapy.  Patient will need to follow up with primary care provider within one week of discharge.  Patient should continue medications as prescribed.  Patient should follow a heart healthy/carb modified diet.     Allergies as of 03/31/2018      Reactions   Zyprexa [olanzapine] Other (See Comments)   Allergy noted by ACT Team. No reaction specified.      Medication List    TAKE these medications   amLODipine 10 MG  tablet Commonly known as:  NORVASC Take 10 mg by mouth daily.   ARIPiprazole 10 MG tablet Commonly known as:   ABILIFY Take 10 mg by mouth daily.   cephALEXin 500 MG capsule Commonly known as:  KEFLEX Take 1 capsule (500 mg total) by mouth every 8 (eight) hours for 3 days.   cloNIDine 0.2 MG tablet Commonly known as:  CATAPRES Take 0.2 mg by mouth 2 (two) times daily.   divalproex 500 MG DR tablet Commonly known as:  DEPAKOTE Take 1,000 mg by mouth at bedtime.   FLUoxetine 20 MG capsule Commonly known as:  PROZAC Take 20 mg by mouth daily.   hydrochlorothiazide 25 MG tablet Commonly known as:  HYDRODIURIL Take 25 mg by mouth daily.   latanoprost 0.005 % ophthalmic solution Commonly known as:  XALATAN Place 1 drop into the right eye at bedtime.   metFORMIN 500 MG 24 hr tablet Commonly known as:  GLUCOPHAGE-XR Take 500 mg by mouth 2 (two) times daily with a meal.   metoprolol succinate 100 MG 24 hr tablet Commonly known as:  TOPROL-XL Take 100 mg by mouth 2 (two) times daily. Take with or immediately following a meal.   protein supplement shake Liqd Commonly known as:  PREMIER PROTEIN Take 325 mLs (11 oz total) by mouth 2 (two) times daily between meals.   rivaroxaban 10 MG Tabs tablet Commonly known as:  XARELTO Take 10 mg by mouth every evening.   senna 8.6 MG Tabs tablet Commonly known as:  SENOKOT Take 1 tablet by mouth 2 (two) times daily.   Vitamin D3 1000 units Chew Chew 1,000 Units by mouth daily.      Allergies  Allergen Reactions  . Zyprexa [Olanzapine] Other (See Comments)    Allergy noted by ACT Team. No reaction specified.    Contact information for follow-up providers    [name], MD. Schedule an appointment as soon as possible for a visit in 1 week(s).   Specialty:  Family Medicine Why:  Hospital follow up Contact information: [address] [place] [address] [phone]            Contact information for after-discharge care    Destination    [facility] .   Service:  Skilled Nursing Contact  information: [address] [place] [address] [phone]                   The results of significant diagnostics from this hospitalization (including imaging, microbiology, ancillary and laboratory) are listed below for reference.    Significant Diagnostic Studies: No results found.  Microbiology: Recent Results (from the past 240 hour(s))  Urine culture     Status: Abnormal   Collection Time: 03/26/18 10:47 PM  Result Value Ref Range Status   Specimen Description   Final    URINE, CLEAN CATCH Performed at [facility], [address], [place], [place] [address]    Special Requests   Final    NONE Performed at [facility], [address] [place], [place] [address]    Culture (A)  Final    >=100,000 COLONIES/mL STAPHYLOCOCCUS SPECIES (COAGULASE NEGATIVE)   Report Status 03/29/2018 FINAL  Final   Organism ID, Bacteria STAPHYLOCOCCUS SPECIES (COAGULASE NEGATIVE) (A)  Final      Susceptibility   Staphylococcus species (coagulase negative) - MIC*    CIPROFLOXACIN <=0.5 SENSITIVE Sensitive     GENTAMICIN <=0.5 SENSITIVE Sensitive  NITROFURANTOIN <=16 SENSITIVE Sensitive     OXACILLIN <=0.25 SENSITIVE Sensitive     TETRACYCLINE <=1 SENSITIVE Sensitive     VANCOMYCIN 1 SENSITIVE Sensitive     TRIMETH/SULFA <=10 SENSITIVE Sensitive     CLINDAMYCIN <=0.25 SENSITIVE Sensitive     RIFAMPIN <=0.5 SENSITIVE Sensitive     Inducible Clindamycin NEGATIVE Sensitive     * >=100,000 COLONIES/mL STAPHYLOCOCCUS SPECIES (COAGULASE NEGATIVE)     Labs: Basic Metabolic Panel: Recent Labs  Lab 03/26/18 2013 03/27/18 0538  NA 137 135  K 3.8 4.0  CL 100* 97*  CO2 26 26  GLUCOSE 192* 138*  BUN 16 16  CREATININE 1.07 1.09  CALCIUM 9.5 9.4   Liver Function Tests: No results for input(s): AST, ALT, ALKPHOS, BILITOT, PROT, ALBUMIN in the last 168 hours. No results for input(s): LIPASE, AMYLASE in the last 168 hours. No  results for input(s): AMMONIA in the last 168 hours. CBC: Recent Labs  Lab 03/26/18 2013 03/27/18 0538  WBC 7.0 5.6  NEUTROABS 5.3  --   HGB 14.7 15.1  HCT 42.9 44.2  MCV 87.7 88.8  PLT 177 162   Cardiac Enzymes: Recent Labs  Lab 03/27/18 0550  CKTOTAL 89  TROPONINI <0.03   BNP: BNP (last 3 results) No results for input(s): BNP in the last 8760 hours.  ProBNP (last 3 results) No results for input(s): PROBNP in the last 8760 hours.  CBG: Recent Labs  Lab 03/30/18 1152 03/30/18 1635 03/30/18 2119 03/31/18 0732 03/31/18 1138  GLUCAP 150* 111* 121* 90 245*       Signed:  Yogesh Cominsky  [facility] 03/31/2018, 11:49 AM

## 2018-03-31 NOTE — Clinical Social Work Placement (Signed)
Patient received and accepted bed offer at [facility] Facility aware of patient's discharge and confirmed bed offer. PTAR contacted, patient's family notified. Patient's RN can call report to [phone], packet complete. CSW signing off, no other needs identified at this time.  CLINICAL SOCIAL WORK PLACEMENT  NOTE  Date:  03/31/2018  Patient Details  Name: Christian [name] MRN: [id number] Date of Birth: 1951/03/26  Clinical Social Work is seeking post-discharge placement for this patient at the Skilled  Nursing Facility level of care (*CSW will initial, date and re-position this form in  chart as items are completed):  Yes   Patient/family provided with [facility] Clinical Social Work Department's list of facilities offering this level of care within the geographic area requested by the patient (or if unable, by the patient's family).  Yes   Patient/family informed of their freedom to choose among providers that offer the needed level of care, that participate in Medicare, Medicaid or managed care program needed by the patient, have an available bed and are willing to accept the patient.  Yes   Patient/family informed of [facility]'s ownership interest in [facility] and [facility], as well as of the fact that they are under no obligation to receive care at these facilities.  PASRR submitted to [facility] on 03/29/18     PASRR number received on 03/31/18     Existing PASRR number confirmed on       FL2 transmitted to all facilities in geographic area requested by pt/family on 03/29/18     FL2 transmitted to all facilities within larger geographic area on       Patient informed that his/her managed care company has contracts with or will negotiate with certain facilities, including the following:        Yes   Patient/family informed of bed offers received.  Patient chooses bed at [facility]     Physician recommends and patient chooses bed at        Patient to be transferred to [facility] on 03/31/18.  Patient to be transferred to facility by PTAR     Patient family notified on 03/31/18 of transfer.  Name of family member notified:  [facility]     PHYSICIAN       Additional Comment:    _______________________________________________ Antionette Poles, LCSW 03/31/2018, 2:06 PM

## 2018-06-17 ENCOUNTER — Emergency Department (HOSPITAL_COMMUNITY): Payer: Medicare PPO

## 2018-06-17 ENCOUNTER — Observation Stay (HOSPITAL_COMMUNITY)
Admission: EM | Admit: 2018-06-17 | Discharge: 2018-06-23 | Disposition: A | Discharge status: Skilled Nursing Facility | Payer: Medicare PPO | Attending: Internal Medicine | Admitting: Internal Medicine

## 2018-06-17 ENCOUNTER — Encounter (HOSPITAL_COMMUNITY): Payer: Self-pay

## 2018-06-17 ENCOUNTER — Other Ambulatory Visit: Payer: Self-pay

## 2018-06-17 DIAGNOSIS — M6281 Muscle weakness (generalized): Secondary | ICD-10-CM | POA: Diagnosis not present

## 2018-06-17 DIAGNOSIS — G4733 Obstructive sleep apnea (adult) (pediatric): Secondary | ICD-10-CM | POA: Diagnosis not present

## 2018-06-17 DIAGNOSIS — Z8249 Family history of ischemic heart disease and other diseases of the circulatory system: Secondary | ICD-10-CM | POA: Insufficient documentation

## 2018-06-17 DIAGNOSIS — E43 Unspecified severe protein-calorie malnutrition: Secondary | ICD-10-CM | POA: Diagnosis not present

## 2018-06-17 DIAGNOSIS — Z86718 Personal history of other venous thrombosis and embolism: Secondary | ICD-10-CM | POA: Diagnosis not present

## 2018-06-17 DIAGNOSIS — Z8261 Family history of arthritis: Secondary | ICD-10-CM | POA: Insufficient documentation

## 2018-06-17 DIAGNOSIS — R0600 Dyspnea, unspecified: Secondary | ICD-10-CM | POA: Insufficient documentation

## 2018-06-17 DIAGNOSIS — Z888 Allergy status to other drugs, medicaments and biological substances status: Secondary | ICD-10-CM | POA: Diagnosis not present

## 2018-06-17 DIAGNOSIS — Z87440 Personal history of urinary (tract) infections: Secondary | ICD-10-CM | POA: Insufficient documentation

## 2018-06-17 DIAGNOSIS — Z79899 Other long term (current) drug therapy: Secondary | ICD-10-CM | POA: Insufficient documentation

## 2018-06-17 DIAGNOSIS — Z9119 Patient's noncompliance with other medical treatment and regimen: Secondary | ICD-10-CM | POA: Insufficient documentation

## 2018-06-17 DIAGNOSIS — I10 Essential (primary) hypertension: Secondary | ICD-10-CM | POA: Diagnosis present

## 2018-06-17 DIAGNOSIS — F3130 Bipolar disorder, current episode depressed, mild or moderate severity, unspecified: Secondary | ICD-10-CM | POA: Diagnosis present

## 2018-06-17 DIAGNOSIS — E1122 Type 2 diabetes mellitus with diabetic chronic kidney disease: Secondary | ICD-10-CM | POA: Insufficient documentation

## 2018-06-17 DIAGNOSIS — Z833 Family history of diabetes mellitus: Secondary | ICD-10-CM | POA: Insufficient documentation

## 2018-06-17 DIAGNOSIS — F3164 Bipolar disorder, current episode mixed, severe, with psychotic features: Secondary | ICD-10-CM | POA: Diagnosis not present

## 2018-06-17 DIAGNOSIS — R778 Other specified abnormalities of plasma proteins: Secondary | ICD-10-CM | POA: Diagnosis present

## 2018-06-17 DIAGNOSIS — Z794 Long term (current) use of insulin: Secondary | ICD-10-CM | POA: Diagnosis not present

## 2018-06-17 DIAGNOSIS — I129 Hypertensive chronic kidney disease with stage 1 through stage 4 chronic kidney disease, or unspecified chronic kidney disease: Secondary | ICD-10-CM | POA: Diagnosis not present

## 2018-06-17 DIAGNOSIS — M109 Gout, unspecified: Secondary | ICD-10-CM | POA: Diagnosis not present

## 2018-06-17 DIAGNOSIS — I252 Old myocardial infarction: Secondary | ICD-10-CM | POA: Insufficient documentation

## 2018-06-17 DIAGNOSIS — I480 Paroxysmal atrial fibrillation: Secondary | ICD-10-CM | POA: Diagnosis not present

## 2018-06-17 DIAGNOSIS — R600 Localized edema: Secondary | ICD-10-CM | POA: Diagnosis present

## 2018-06-17 DIAGNOSIS — R339 Retention of urine, unspecified: Secondary | ICD-10-CM | POA: Diagnosis not present

## 2018-06-17 DIAGNOSIS — R7989 Other specified abnormal findings of blood chemistry: Secondary | ICD-10-CM | POA: Diagnosis present

## 2018-06-17 DIAGNOSIS — Z86711 Personal history of pulmonary embolism: Secondary | ICD-10-CM | POA: Diagnosis present

## 2018-06-17 DIAGNOSIS — R748 Abnormal levels of other serum enzymes: Secondary | ICD-10-CM | POA: Diagnosis not present

## 2018-06-17 DIAGNOSIS — Z7901 Long term (current) use of anticoagulants: Secondary | ICD-10-CM | POA: Diagnosis not present

## 2018-06-17 DIAGNOSIS — F312 Bipolar disorder, current episode manic severe with psychotic features: Secondary | ICD-10-CM | POA: Diagnosis present

## 2018-06-17 DIAGNOSIS — R2689 Other abnormalities of gait and mobility: Secondary | ICD-10-CM | POA: Insufficient documentation

## 2018-06-17 DIAGNOSIS — E118 Type 2 diabetes mellitus with unspecified complications: Secondary | ICD-10-CM | POA: Diagnosis present

## 2018-06-17 DIAGNOSIS — F319 Bipolar disorder, unspecified: Secondary | ICD-10-CM | POA: Diagnosis present

## 2018-06-17 DIAGNOSIS — N189 Chronic kidney disease, unspecified: Secondary | ICD-10-CM | POA: Diagnosis not present

## 2018-06-17 DIAGNOSIS — E785 Hyperlipidemia, unspecified: Secondary | ICD-10-CM | POA: Insufficient documentation

## 2018-06-17 DIAGNOSIS — R2681 Unsteadiness on feet: Secondary | ICD-10-CM | POA: Insufficient documentation

## 2018-06-17 DIAGNOSIS — F3175 Bipolar disorder, in partial remission, most recent episode depressed: Secondary | ICD-10-CM | POA: Diagnosis present

## 2018-06-17 LAB — I-STAT TROPONIN, ED: Troponin i, poc: 0.13 ng/mL (ref 0.00–0.08)

## 2018-06-17 LAB — DIFFERENTIAL
Abs Immature Granulocytes: 0 10*3/uL (ref 0.0–0.1)
Basophils Absolute: 0 10*3/uL (ref 0.0–0.1)
Basophils Relative: 0 %
Eosinophils Absolute: 0.1 10*3/uL (ref 0.0–0.7)
Eosinophils Relative: 1 %
Immature Granulocytes: 1 %
Lymphocytes Relative: 27 %
Lymphs Abs: 1.2 10*3/uL (ref 0.7–4.0)
Monocytes Absolute: 0.5 10*3/uL (ref 0.1–1.0)
Monocytes Relative: 11 %
Neutro Abs: 2.7 10*3/uL (ref 1.7–7.7)
Neutrophils Relative %: 60 %

## 2018-06-17 LAB — CBC
HCT: 49.7 % (ref 39.0–52.0)
Hemoglobin: 15.9 g/dL (ref 13.0–17.0)
MCH: 28.8 pg (ref 26.0–34.0)
MCHC: 32 g/dL (ref 30.0–36.0)
MCV: 90 fL (ref 78.0–100.0)
Platelets: 174 10*3/uL (ref 150–400)
RBC: 5.52 MIL/uL (ref 4.22–5.81)
RDW: 12.1 % (ref 11.5–15.5)
WBC: 4.5 10*3/uL (ref 4.0–10.5)

## 2018-06-17 LAB — URINALYSIS, ROUTINE W REFLEX MICROSCOPIC
Bilirubin Urine: NEGATIVE
Glucose, UA: 50 mg/dL — AB
Hgb urine dipstick: NEGATIVE
Ketones, ur: NEGATIVE mg/dL
Leukocytes, UA: NEGATIVE
Nitrite: NEGATIVE
Protein, ur: NEGATIVE mg/dL
Specific Gravity, Urine: 1.006 (ref 1.005–1.030)
pH: 7 (ref 5.0–8.0)

## 2018-06-17 LAB — VALPROIC ACID LEVEL: Valproic Acid Lvl: 42 ug/mL — ABNORMAL LOW (ref 50.0–100.0)

## 2018-06-17 LAB — COMPREHENSIVE METABOLIC PANEL
ALT: 12 U/L (ref 0–44)
AST: 18 U/L (ref 15–41)
Albumin: 3.7 g/dL (ref 3.5–5.0)
Alkaline Phosphatase: 72 U/L (ref 38–126)
Anion gap: 14 (ref 5–15)
BUN: 16 mg/dL (ref 8–23)
CO2: 27 mmol/L (ref 22–32)
Calcium: 9.6 mg/dL (ref 8.9–10.3)
Chloride: 95 mmol/L — ABNORMAL LOW (ref 98–111)
Creatinine, Ser: 1.19 mg/dL (ref 0.61–1.24)
GFR calc Af Amer: >60 mL/min (ref >60)
GFR calc non Af Amer: >60 mL/min (ref >60)
Glucose, Bld: 206 mg/dL — ABNORMAL HIGH (ref 70–99)
Potassium: 4.1 mmol/L (ref 3.5–5.1)
Sodium: 136 mmol/L (ref 135–145)
Total Bilirubin: 0.6 mg/dL (ref 0.3–1.2)
Total Protein: 7.6 g/dL (ref 6.5–8.1)

## 2018-06-17 LAB — BRAIN NATRIURETIC PEPTIDE: B Natriuretic Peptide: 50.7 pg/mL (ref 0.0–100.0)

## 2018-06-17 LAB — TROPONIN I: Troponin I: 0.44 ng/mL (ref <0.03)

## 2018-06-17 LAB — CBG MONITORING, ED: Glucose-Capillary: 185 mg/dL — ABNORMAL HIGH (ref 70–99)

## 2018-06-17 LAB — CK: Total CK: 152 U/L (ref 49–397)

## 2018-06-17 MED ORDER — VITAMIN D3 25 MCG (1000 UT) PO CHEW: 1000.0000 [IU] | CHEWABLE_TABLET | Freq: Every day | ORAL | Status: DC

## 2018-06-17 MED ORDER — LATANOPROST 0.005 % OP SOLN
1.0000 [drp] | Freq: Every day | OPHTHALMIC | Status: DC
Start: 2018-06-17 — End: 2018-06-18

## 2018-06-17 MED ORDER — DIVALPROEX SODIUM 250 MG PO DR TAB
1000.0000 mg | DELAYED_RELEASE_TABLET | Freq: Every day | ORAL | Status: DC
  Administered 2018-06-17 – 2018-06-22 (×6): 1000 mg via ORAL
  Filled 2018-06-17 (×6): qty 4

## 2018-06-17 MED ORDER — ASPIRIN 81 MG PO CHEW
324.0000 mg | CHEWABLE_TABLET | Freq: Once | ORAL | Status: AC
  Administered 2018-06-17: 324 mg via ORAL
  Filled 2018-06-17: qty 4

## 2018-06-17 MED ORDER — CLONIDINE HCL 0.2 MG PO TABS
0.2000 mg | ORAL_TABLET | Freq: Two times a day (BID) | ORAL | Status: DC
  Administered 2018-06-17 – 2018-06-23 (×12): 0.2 mg via ORAL
  Filled 2018-06-17 (×12): qty 1

## 2018-06-17 MED ORDER — ONDANSETRON HCL 4 MG PO TABS
4.0000 mg | ORAL_TABLET | Freq: Four times a day (QID) | ORAL | Status: DC | PRN
  Filled 2018-06-17 (×2): qty 1

## 2018-06-17 MED ORDER — RIVAROXABAN 10 MG PO TABS
10.0000 mg | ORAL_TABLET | Freq: Every evening | ORAL | Status: DC
  Administered 2018-06-18: 10 mg via ORAL
  Filled 2018-06-17 (×2): qty 1

## 2018-06-17 MED ORDER — LATANOPROST 0.005 % OP SOLN: 1.0000 [drp] | Freq: Every day | OPHTHALMIC | Status: DC

## 2018-06-17 MED ORDER — OXYCODONE HCL 5 MG PO TABS: 5.0000 mg | ORAL_TABLET | ORAL | Status: DC | PRN

## 2018-06-17 MED ORDER — ACETAMINOPHEN 325 MG PO TABS: 650.0000 mg | ORAL_TABLET | Freq: Four times a day (QID) | ORAL | Status: DC | PRN

## 2018-06-17 MED ORDER — ONDANSETRON HCL 4 MG/2ML IJ SOLN: 4.0000 mg | Freq: Four times a day (QID) | INTRAMUSCULAR | Status: DC | PRN

## 2018-06-17 MED ORDER — HYDROCHLOROTHIAZIDE 25 MG PO TABS
25.0000 mg | ORAL_TABLET | Freq: Every day | ORAL | Status: DC
  Administered 2018-06-18: 25 mg via ORAL
  Filled 2018-06-17: qty 1

## 2018-06-17 MED ORDER — AMLODIPINE BESYLATE 5 MG PO TABS
10.0000 mg | ORAL_TABLET | Freq: Every day | ORAL | Status: DC
  Administered 2018-06-18 – 2018-06-23 (×6): 10 mg via ORAL
  Filled 2018-06-17 (×6): qty 2

## 2018-06-17 MED ORDER — ARIPIPRAZOLE 5 MG PO TABS: 10.0000 mg | ORAL_TABLET | Freq: Every day | ORAL | Status: DC

## 2018-06-17 MED ORDER — DOCUSATE SODIUM 100 MG PO CAPS
100.0000 mg | ORAL_CAPSULE | Freq: Two times a day (BID) | ORAL | Status: DC
  Administered 2018-06-17 – 2018-06-23 (×10): 100 mg via ORAL
  Filled 2018-06-17 (×11): qty 1

## 2018-06-17 MED ORDER — METOPROLOL SUCCINATE ER 100 MG PO TB24: 100.0000 mg | ORAL_TABLET | Freq: Two times a day (BID) | ORAL | Status: DC

## 2018-06-17 MED ORDER — SODIUM CHLORIDE 0.9% FLUSH
3.0000 mL | Freq: Two times a day (BID) | INTRAVENOUS | Status: DC
  Administered 2018-06-18 – 2018-06-23 (×10): 3 mL via INTRAVENOUS

## 2018-06-17 MED ORDER — CLONIDINE HCL 0.2 MG PO TABS
0.2000 mg | ORAL_TABLET | Freq: Once | ORAL | Status: AC
  Administered 2018-06-17: 0.2 mg via ORAL
  Filled 2018-06-17: qty 1

## 2018-06-17 MED ORDER — LABETALOL HCL 5 MG/ML IV SOLN
20.0000 mg | Freq: Once | INTRAVENOUS | Status: AC
  Administered 2018-06-17: 20 mg via INTRAVENOUS
  Filled 2018-06-17: qty 4

## 2018-06-17 MED ORDER — SENNA 8.6 MG PO TABS
1.0000 | ORAL_TABLET | Freq: Two times a day (BID) | ORAL | Status: DC
  Administered 2018-06-17 – 2018-06-23 (×10): 8.6 mg via ORAL
  Filled 2018-06-17 (×11): qty 1

## 2018-06-17 MED ORDER — ACETAMINOPHEN 650 MG RE SUPP: 650.0000 mg | Freq: Four times a day (QID) | RECTAL | Status: DC | PRN

## 2018-06-17 MED ORDER — POTASSIUM CHLORIDE IN NACL 20-0.9 MEQ/L-% IV SOLN
INTRAVENOUS | Status: AC
  Administered 2018-06-17 – 2018-06-18 (×2): via INTRAVENOUS
  Filled 2018-06-17 (×2): qty 1000

## 2018-06-17 MED ORDER — ARIPIPRAZOLE 10 MG PO TABS: 10.0000 mg | ORAL_TABLET | Freq: Every day | ORAL | Status: DC

## 2018-06-17 MED ORDER — METOPROLOL SUCCINATE ER 100 MG PO TB24
100.0000 mg | ORAL_TABLET | Freq: Two times a day (BID) | ORAL | Status: DC
  Administered 2018-06-18 – 2018-06-23 (×11): 100 mg via ORAL
  Filled 2018-06-17 (×11): qty 1

## 2018-06-17 MED ORDER — FLUOXETINE HCL 20 MG PO CAPS
20.0000 mg | ORAL_CAPSULE | Freq: Every day | ORAL | Status: DC
  Administered 2018-06-18 – 2018-06-20 (×3): 20 mg via ORAL
  Filled 2018-06-17 (×3): qty 1

## 2018-06-17 NOTE — ED Notes (Signed)
Admitting at bedside 

## 2018-06-17 NOTE — ED Notes (Signed)
IV team continues to be at bedside

## 2018-06-17 NOTE — Progress Notes (Signed)
Troponin 0.44.  Results sent to Linton Flemings NP via [facility] service.

## 2018-06-17 NOTE — ED Notes (Signed)
Check patient for urine output, none in tube or bag, condom still in place.  NT performed bladder scan.

## 2018-06-17 NOTE — Progress Notes (Signed)
Unable to establish a 20 gauge IV access for CT scan.  Notified provider on call.

## 2018-06-17 NOTE — Progress Notes (Signed)
Blood pressure 176/101. Will give scheduled clonidine.  Unable to give Toprol, Abilify, xeralto, or latanoprost per pharmacy.  Meds have not been verified by [facility] pharmacy.  They state that they will follow up tomorrow after calling patients home pharmacy.  Provider notified.  Will give clonidine now and continue to monitor blood pressure.  Will notify provider if blood pressure does not improve.

## 2018-06-17 NOTE — ED Notes (Signed)
Spoke to lab able to add on BNP

## 2018-06-17 NOTE — ED Notes (Signed)
Bladder scan yielded an average of urine.

## 2018-06-17 NOTE — ED Notes (Signed)
Attempted report x1. 

## 2018-06-17 NOTE — ED Notes (Signed)
Labwork sent off to main lab; dark green top on rocker in mini lab.

## 2018-06-17 NOTE — ED Triage Notes (Signed)
Per EMS:  Pt presents to ED for assessment of altered mental status, frquent UTIs, decreased ability to walk, and posterior thigh swelling (lower leg edema is normal for pt, thighs are not according to ex wife).  Pt HR 140's with EMS, BP 220/120.   Temp normal.  Patient has a tremor on arrival, family states happens when pt has UTI.  Pt was soaked in urine on arrival.  Cleaned, dried, and condom cath applied. Genitalia has crusted discharge noted.

## 2018-06-17 NOTE — ED Notes (Signed)
IV team continues to be at bedside 

## 2018-06-17 NOTE — ED Provider Notes (Signed)
[facility] EMERGENCY DEPARTMENT Provider Note   CSN: [id number] Arrival date & time: 06/17/18  1227     History   Chief Complaint Chief Complaint  Patient presents with  . Dysuria  . Hypertension    HPI Christian [name] is a 67 y.o. male.  HPI Lives at home and has significant medical history for diabetes, chronic kidney disease, hypertension and bipolar disorder.  He is cared for by his ex-wife, his daughter and his sister.  His ex-wife is with him at this time and is a main historian.  She reports that he generally is very inactive and spends much of his time lying in a recliner.  He is often very weak but has gotten weaker.  She reports he does eat when they bring him food.  He has not had vomiting.  He has been urinating very large amounts.  She reports no sooner do they get him changed and cleaned up then he is urinating again.  She has had recently he has started to endorse some burning with urination.  He has prior history of UTI and hospitalization.  His medications are regularly administered to him (they are prepackaged and a blister pack that he has with him.  His blood pressure medications are clonidine 0.2 mg twice daily, Norvasc 10 mg daily and hydrochlorothiazide).  His wife reports due to the amount of urine output, his Lasix dose was recently decreased.  He is also taking hydrochlorothiazide.  The patient does interact and speak.  He is quiet and slow but his wife reports this is normal for him.  Denies he had any chest pain or shortness of breath.  He denies lower extremity pain.  Patient does have recall for prior events in his medical history.  Past Medical History:  Diagnosis Date  . 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 Bipolar 1 disorder (HCC)   . Bipolar disorder (HCC)   . Chronic kidney disease   . Gout   . Hx of blood clots   . Hypertension   . Mood swings   . OSA on CPAP 06/02/2014  . Other and  unspecified hyperlipidemia 06/02/2014  . Type II or unspecified type diabetes mellitus with unspecified complication, uncontrolled 06/02/2014    Patient Active Problem List   Diagnosis Date Noted  . History of pulmonary embolism 06/17/2018  . Generalized weakness 03/27/2018  . Diabetes mellitus type 2 in nonobese (HCC) 03/27/2018  . Pulmonary embolism (HCC) 03/27/2018  . Fall   . Laceration of eyebrow   . Bipolar I disorder, most recent episode depressed, severe w psychosis (HCC) 12/22/2015  . Bipolar affective disorder, depressed, severe, with psychotic behavior (HCC)   . Bipolar I disorder, current or most recent episode depressed, with psychotic features (HCC)   . Bipolar I disorder, most recent episode depressed (HCC)   . Severe bipolar I disorder with depression (HCC)   . Bipolar I disorder, most recent episode depressed, severe with psychotic features (HCC)   . Overdose 09/11/2015  . Acute respiratory failure with hypoxia (HCC) 09/05/2015  . Elevated troponin 09/05/2015  . Somnolence 09/05/2015  . Bipolar depression (HCC) 09/05/2015  . Acute bilateral deep vein thrombosis (DVT) of femoral veins (HCC) 09/05/2015  . Bilateral pulmonary embolism (HCC) 09/02/2015  . Labile hypertension   . Pulmonary embolism with acute cor pulmonale (HCC)   . Dyspnea   . Orthostatic hypotension   . Bipolar disorder, in partial remission, most recent episode  depressed (HCC)   . Syncope, near 08/31/2015  . Affective psychosis, bipolar (HCC)   . Bipolar disorder, now depressed (HCC) 08/28/2015  . Bipolar I disorder, severe, current or most recent episode depressed, with psychotic features (HCC)   . Pressure ulcer 07/30/2015  . Protein-calorie malnutrition, severe (HCC) 07/30/2015  . Dehydration 07/29/2015  . Bipolar disorder, current episode depressed, severe, with psychotic features (HCC) 07/09/2015  . Diabetes (HCC) 07/09/2015  . UTI (urinary tract infection) 06/19/2014  . Positive blood culture  06/19/2014  . Bipolar affective disorder, current episode manic with psychotic symptoms (HCC) 06/17/2014  . Type 2 diabetes mellitus with complication, without long-term current use of insulin (HCC) 06/02/2014  . Other and unspecified hyperlipidemia 06/02/2014  . 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 Gout 06/02/2014  . Hypertension     History reviewed. No pertinent surgical history.      Home Medications    Prior to Admission medications   Medication Sig Start Date End Date Taking? Authorizing Provider  amLODipine (NORVASC) 10 MG tablet Take 10 mg by mouth every morning.    Yes [provider]  atorvastatin (LIPITOR) 10 MG tablet Take 10 mg by mouth every morning.   Yes [provider]  cloNIDine (CATAPRES) 0.2 MG tablet Take 0.2 mg by mouth 2 (two) times daily.   Yes [provider]  divalproex (DEPAKOTE) 500 MG DR tablet Take 1,000 mg by mouth at bedtime.   Yes [provider]  FLUoxetine (PROZAC) 20 MG capsule Take 20 mg by mouth every morning.    Yes [provider]  furosemide (LASIX) 20 MG tablet Take 20 mg by mouth every morning.   Yes [provider]  hydrochlorothiazide (HYDRODIURIL) 25 MG tablet Take 25 mg by mouth every morning.    Yes [provider]  metFORMIN (GLUCOPHAGE-XR) 500 MG 24 hr tablet Take 1,000 mg by mouth daily with breakfast.    Yes [provider]  senna (SENOKOT) 8.6 MG TABS tablet Take 1 tablet by mouth 2 (two) times daily.   Yes [provider]  ARIPiprazole (ABILIFY) 10 MG tablet Take 10 mg by mouth daily.    [provider]  Cholecalciferol (VITAMIN D3) 1000 units CHEW Chew 1,000 Units by mouth daily.    [provider]  latanoprost (XALATAN) 0.005 % ophthalmic solution Place 1 drop into the right eye at bedtime.    [provider]  metoprolol succinate (TOPROL-XL) 100 MG 24 hr tablet Take 100 mg by  mouth 2 (two) times daily. Take with or immediately following a meal.    [provider]  protein supplement shake (PREMIER PROTEIN) LIQD Take 325 mLs (11 oz total) by mouth 2 (two) times daily between meals. Patient not taking: Reported on 06/17/2018 03/31/18   Edsel Petrin, DO  rivaroxaban (XARELTO) 10 MG TABS tablet Take 10 mg by mouth every evening.    [provider]    Family History Family History  Problem Relation Age of Onset  . Dementia Mother   . Arthritis Father   . Hypertension Father   . Mental illness Sister   . Diabetes Sister   . Heart failure Neg Hx   . Kidney failure Neg Hx   . Cancer Neg Hx     Social History Social History   Tobacco Use  . Smoking status: Never Smoker  . Smokeless tobacco: Never Used  Substance Use Topics  . Alcohol use: No  . Drug  use: No     Allergies   Zyprexa [olanzapine]   Review of Systems Review of Systems 10 Systems reviewed and are negative for acute change except as noted in the HPI.   Physical Exam Updated Vital Signs BP (!) 176/101 (BP Location: Right Arm)   Pulse 98   Temp 98.4 F (36.9 C) (Oral)   Resp 20   Ht 6' (1.829 m)   Wt 107.1 kg (236 lb 1.8 oz)   SpO2 100%   BMI 32.02 kg/m   Physical Exam  Constitutional:  Patient is awake and alert.  No respiratory distress at rest.  He is very deconditioned appearing, positive obesity.  HENT:  Head: Normocephalic and atraumatic.  Poor dentition, mucous membranes moist.  Eyes: Pupils are equal, round, and reactive to light. EOM are normal.  Neck: Neck supple.  Cardiovascular: Normal rate, regular rhythm, normal heart sounds and intact distal pulses.  Pulmonary/Chest: Effort normal and breath sounds normal.  Abdominal: Soft.  Patient endorses discomfort to palpation in the suprapubic area.  No guarding.  Genitourinary:  Genitourinary Comments: No genital swelling.  Musculoskeletal:  2+ pitting edema bilateral lower extremities.  1 cm  blister on fifth digit right foot.  Neurological: He is alert.  Patient is awake and alert.  He speaks very little.  His ex-wife reports this is a baseline for him.  He does answer a few questions.  Patient has tremor with any intentional actions.  Affect is very flat.  He can move all extremities although does seem generally weak.  No focal motor deficit.  Skin: Skin is warm and dry.  Psychiatric:  Affect extremely flat with little verbal interaction.     ED Treatments / Results  Labs (all labs ordered are listed, but only abnormal results are displayed) Labs Reviewed  COMPREHENSIVE METABOLIC PANEL - Abnormal; Notable for the following components:      Result Value   Chloride 95 (*)    Glucose, Bld 206 (*)    All other components within normal limits  URINALYSIS, ROUTINE W REFLEX MICROSCOPIC - Abnormal; Notable for the following components:   Color, Urine STRAW (*)    Glucose, UA 50 (*)    All other components within normal limits  VALPROIC ACID LEVEL - Abnormal; Notable for the following components:   Valproic Acid Lvl 42 (*)    All other components within normal limits  TROPONIN I - Abnormal; Notable for the following components:   Troponin I 0.44 (*)    All other components within normal limits  CBG MONITORING, ED - Abnormal; Notable for the following components:   Glucose-Capillary 185 (*)    All other components within normal limits  I-STAT TROPONIN, ED - Abnormal; Notable for the following components:   Troponin i, poc 0.13 (*)    All other components within normal limits  URINE CULTURE  CBC  CK  DIFFERENTIAL  BRAIN NATRIURETIC PEPTIDE  TROPONIN I  BASIC METABOLIC PANEL  CBC  TROPONIN I    EKG EKG Interpretation  Date/Time:  Saturday June 17 2018 15:21:58 EDT Ventricular Rate:  116 PR Interval:    QRS Duration: 91 QT Interval:  325 QTC Calculation: 452 R Axis:   -67 Text Interpretation:  Sinus tachycardia Left anterior fascicular block Anterior infarct,  old no change from previous Confirmed by Arby Barrette [id number]) on 06/17/2018 4:23:38 PM   Radiology Dg Chest Port 1 View  Result Date: 06/17/2018 CLINICAL DATA:  Altered mental status.  History of hypertension.  EXAM: PORTABLE CHEST 1 VIEW COMPARISON:  Chest radiograph August 31, 2015 and chest CT September 02, 2015 FINDINGS: There is no edema or consolidation. Heart is borderline prominent with pulmonary vascularity normal. No adenopathy. There is aortic atherosclerosis. There is degenerative change in each shoulder. IMPRESSION: Borderline cardiac prominence. No edema or consolidation. Aortic atherosclerosis. Aortic Atherosclerosis (ICD10-I70.0). Electronically Signed   By: Bretta Bang III M.D.   On: 06/17/2018 15:27    Procedures Procedures (including critical care time) CRITICAL CARE Performed by: Arby Barrette   Total critical care time: 30 minutes  Critical care time was exclusive of separately billable procedures and treating other patients.  Critical care was necessary to treat or prevent imminent or life-threatening deterioration.  Critical care was time spent personally by me on the following activities: development of treatment plan with patient and/or surrogate as well as nursing, discussions with consultants, evaluation of patient's response to treatment, examination of patient, obtaining history from patient or surrogate, ordering and performing treatments and interventions, ordering and review of laboratory studies, ordering and review of radiographic studies, pulse oximetry and re-evaluation of patient's condition. Medications Ordered in ED Medications  amLODipine (NORVASC) tablet 10 mg (has no administration in time range)  cloNIDine (CATAPRES) tablet 0.2 mg (has no administration in time range)  divalproex (DEPAKOTE) DR tablet 1,000 mg (has no administration in time range)  FLUoxetine (PROZAC) capsule 20 mg (has no administration in time range)  hydrochlorothiazide  (HYDRODIURIL) tablet 25 mg (has no administration in time range)  rivaroxaban (XARELTO) tablet 10 mg (10 mg Oral Not Given 06/17/18 2232)  senna (SENOKOT) tablet 8.6 mg (has no administration in time range)  sodium chloride flush (NS) 0.9 % injection 3 mL (3 mLs Intravenous Not Given 06/17/18 2131)  0.9 % NaCl with KCl 20 mEq/ L  infusion ( Intravenous Rate/Dose Verify 06/17/18 2100)  acetaminophen (TYLENOL) tablet 650 mg (has no administration in time range)    Or  acetaminophen (TYLENOL) suppository 650 mg (has no administration in time range)  oxyCODONE (Oxy IR/ROXICODONE) immediate release tablet 5 mg (has no administration in time range)  docusate sodium (COLACE) capsule 100 mg (has no administration in time range)  ondansetron (ZOFRAN) tablet 4 mg (has no administration in time range)    Or  ondansetron (ZOFRAN) injection 4 mg (has no administration in time range)  ARIPiprazole (ABILIFY) tablet 10 mg (10 mg Oral Not Given 06/17/18 2230)  latanoprost (XALATAN) 0.005 % ophthalmic solution 1 drop (1 drop Right Eye Not Given 06/17/18 2231)  metoprolol succinate (TOPROL-XL) 24 hr tablet 100 mg (100 mg Oral Not Given 06/17/18 2232)  labetalol (NORMODYNE,TRANDATE) injection 20 mg (20 mg Intravenous Given 06/17/18 1630)  cloNIDine (CATAPRES) tablet 0.2 mg (0.2 mg Oral Given 06/17/18 1630)  aspirin chewable tablet 324 mg (324 mg Oral Given 06/17/18 1709)     Initial Impression / Assessment and Plan / ED Course  I have reviewed the triage vital signs and the nursing notes.  Pertinent labs & imaging results that were available during my care of the patient were reviewed by me and considered in my medical decision making (see chart for details).    Consult: [facility] hospitalist for admission Final Clinical Impressions(s) / ED Diagnoses   Final diagnoses:  Urinary retention   At baseline, patient is limited in mobility.  He however has had increased general weakness and unable to get up from his  recliner chair.  He has been urinating continuously.  Significant urinary retention identified.  Also  in course of work-up troponin elevated.  Patient has lower extremity swelling.  Will add CTA.  Plan for admission.  Patient has not endorsed chest pain and does not have acute ischemic pattern on EKG.  No signs of acute MI.  Will need serial troponins. ED Discharge Orders    None       Arby Barrette, MD 06/30/18 [phone]

## 2018-06-17 NOTE — ED Notes (Signed)
Pt put on condom catheter upon triage/arrival.

## 2018-06-17 NOTE — ED Notes (Signed)
Went in to collect urine sample.  Patient had urinated all over himself, condom cath had dislodged.   Patient cleaned up, all linens changed.  Smaller condom cath applied after pericare.

## 2018-06-17 NOTE — ED Notes (Signed)
IV Team at bedside 

## 2018-06-17 NOTE — H&P (Signed)
History and Physical    Rondal Vandevelde Joshua ZOX:[id number] DOB: 1951/07/06 DOA: 06/17/2018  PCP: [name], MD   Patient coming from: Home  I have personally briefly reviewed patient's old medical records in [facility]  Chief Complaint: Increased weakness and increased urine output with foul odor  HPI: Aloysuis Ribaudo Manzo is a 67 y.o. male with medical history significant of diabetes, chronic kidney disease, hypertension,  bipolar disease and bilateral pulmonary emboli currently anticoagulated.  He is cared for by his ex-wife, his daughter and his sister.  Recently discharged in May and went to skilled nursing facility.  Since then he has been at home with the care of these 3 people.  He is inactive and spends much of his time in a recliner.  He has been getting up out of the recliner to get to the table.  His ex-wife has been bringing him food which he has been eating.  Recently he started complaining of burning with urination and has a history of UTI and hospitalization.  Since come prepackaged and a blister pack and he gets them regularly.  Has had significant amounts of urine output with constant urination and his Lasix dose was recently decreased.  He takes hydrochlorothiazide as well.  And is able to speak and answer some questions.  His answers are quite slow and he is somewhat motor delayed as well.  He denies chest pain, shortness of breath, lower extremity pain he has recall of prior events in his medical history and he does not endorse any significant cardiac symptoms.  He was found to have an elevated troponin on evaluation and I was asked to further evaluate and manage him.  Due to elevated troponin Dr. Clarice Pole has ordered a CTA of the chest.  Patient had a bladder scan performed which revealed 800 mL of residual urine in his bladder and therefore a Foley catheter was placed.  Review of Systems: As per HPI otherwise all other systems reviewed and  negative.    Past Medical History:   Diagnosis Date  . 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 Bipolar 1 disorder (HCC)   . Bipolar disorder (HCC)   . Chronic kidney disease   . Gout   . Hx of blood clots   . Hypertension   . Mood swings   . OSA on CPAP 06/02/2014  . Other and unspecified hyperlipidemia 06/02/2014  . Type II or unspecified type diabetes mellitus with unspecified complication, uncontrolled 06/02/2014    No past surgical history on file.  Social History   Social History Narrative   *The patient has been married for over 30 years and is currently on disability. He lives with his wife in the [place] area and has one daughter.               reports that he has never smoked. He has never used smokeless tobacco. He reports that he does not drink alcohol or use drugs.  Allergies  Allergen Reactions  . Zyprexa [Olanzapine] Other (See Comments)    Allergy noted by ACT Team. No reaction specified.    Family History  Problem Relation Age of Onset  . Dementia Mother   . Arthritis Father   . Hypertension Father   . Mental illness Sister   . Diabetes Sister   . Heart failure Neg Hx   . Kidney failure Neg Hx   . Cancer Neg Hx     Prior to Admission medications  Medication Sig Start Date End Date Taking? Authorizing Provider  amLODipine (NORVASC) 10 MG tablet Take 10 mg by mouth daily.    [provider]  ARIPiprazole (ABILIFY) 10 MG tablet Take 10 mg by mouth daily.    [provider]  Cholecalciferol (VITAMIN D3) 1000 units CHEW Chew 1,000 Units by mouth daily.    [provider]  cloNIDine (CATAPRES) 0.2 MG tablet Take 0.2 mg by mouth 2 (two) times daily.    [provider]  divalproex (DEPAKOTE) 500 MG DR tablet Take 1,000 mg by mouth at bedtime.    [provider]  FLUoxetine (PROZAC) 20 MG capsule Take 20 mg by mouth daily.    [provider]  hydrochlorothiazide (HYDRODIURIL) 25 MG tablet Take 25 mg by  mouth daily.    [provider]  latanoprost (XALATAN) 0.005 % ophthalmic solution Place 1 drop into the right eye at bedtime.    [provider]  metFORMIN (GLUCOPHAGE-XR) 500 MG 24 hr tablet Take 500 mg by mouth 2 (two) times daily with a meal.     [provider]  metoprolol succinate (TOPROL-XL) 100 MG 24 hr tablet Take 100 mg by mouth 2 (two) times daily. Take with or immediately following a meal.    [provider]  protein supplement shake (PREMIER PROTEIN) LIQD Take 325 mLs (11 oz total) by mouth 2 (two) times daily between meals. 03/31/18   Mikhail, Nita Sells, DO  rivaroxaban (XARELTO) 10 MG TABS tablet Take 10 mg by mouth every evening.    [provider]  senna (SENOKOT) 8.6 MG TABS tablet Take 1 tablet by mouth 2 (two) times daily.    [provider]    Physical Exam:  Constitutional: NAD, calm, comfortable, sitting up eating applesauce Vitals:   06/17/18 1236 06/17/18 1300 06/17/18 1400 06/17/18 1530  BP: (!) 183/110 (!) 189/119 (!) 178/90 (!) 210/135  Pulse: (!) 116 (!) 112 (!) 107 (!) 114  Resp: (!) 24 15 (!) 21 (!) 24  Temp: 98.7 F (37.1 C)     TempSrc: Oral     SpO2: 99% 98% 97% 100%   Eyes: PERRL, lids and conjunctivae normal ENMT: Mucous membranes are moist. Posterior pharynx clear of any exudate or lesions.Normal dentition.  Neck: normal, supple, no masses, no thyromegaly Respiratory: clear to auscultation bilaterally, no wheezing, no crackles. Normal respiratory effort. No accessory muscle use.  Cardiovascular: Regular rate and rhythm, no murmurs / rubs / gallops. No extremity edema. 2+ pedal pulses. No carotid bruits.  Abdomen: no tenderness, no masses palpated. No hepatosplenomegaly. Bowel sounds positive.  Early catheter in place placed in the emergency department Musculoskeletal: no clubbing / cyanosis. No joint deformity upper and lower extremities. Good ROM, no contractures. Normal muscle tone.  Skin: no  rashes, lesions, ulcers. No induration Neurologic: CN 2-12 grossly intact. Sensation intact, DTR normal. Strength 5/5 in all 4.  Psychomotor retardation Psychiatric: Normal judgment and insight. Alert and oriented x 3. Normal mood.    Labs on Admission: I have personally reviewed following labs and imaging studies  CBC: Recent Labs  Lab 06/17/18 1254  WBC 4.5  NEUTROABS 2.7  HGB 15.9  HCT 49.7  MCV 90.0  PLT 174   Basic Metabolic Panel: Recent Labs  Lab 06/17/18 1254  NA 136  K 4.1  CL 95*  CO2 27  GLUCOSE 206*  BUN 16  CREATININE 1.19  CALCIUM 9.6   GFR: CrCl cannot be calculated (Unknown ideal weight.). Liver Function  Tests: Recent Labs  Lab 06/17/18 1254  AST 18  ALT 12  ALKPHOS 72  BILITOT 0.6  PROT 7.6  ALBUMIN 3.7   Cardiac Enzymes: Recent Labs  Lab 06/17/18 1522  CKTOTAL 152    CBG: Recent Labs  Lab 06/17/18 1243  GLUCAP 185*   Urine analysis:    Component Value Date/Time   COLORURINE STRAW (A) 06/17/2018 1552   APPEARANCEUR CLEAR 06/17/2018 1552   APPEARANCEUR Cloudy 08/01/2014 1614   LABSPEC 1.006 06/17/2018 1552   LABSPEC 1.008 08/01/2014 1614   PHURINE 7.0 06/17/2018 1552   GLUCOSEU 50 (A) 06/17/2018 1552   GLUCOSEU Negative 08/01/2014 1614   HGBUR NEGATIVE 06/17/2018 1552   BILIRUBINUR NEGATIVE 06/17/2018 1552   BILIRUBINUR Negative 08/01/2014 1614   KETONESUR NEGATIVE 06/17/2018 1552   PROTEINUR NEGATIVE 06/17/2018 1552   UROBILINOGEN 0.2 07/01/2014 1809   NITRITE NEGATIVE 06/17/2018 1552   LEUKOCYTESUR NEGATIVE 06/17/2018 1552   LEUKOCYTESUR Negative 08/01/2014 1614    Radiological Exams on Admission: Dg Chest Port 1 View  Result Date: 06/17/2018 CLINICAL DATA:  Altered mental status.  History of hypertension. EXAM: PORTABLE CHEST 1 VIEW COMPARISON:  Chest radiograph August 31, 2015 and chest CT September 02, 2015 FINDINGS: There is no edema or consolidation. Heart is borderline prominent with pulmonary vascularity normal.  No adenopathy. There is aortic atherosclerosis. There is degenerative change in each shoulder. IMPRESSION: Borderline cardiac prominence. No edema or consolidation. Aortic atherosclerosis. Aortic Atherosclerosis (ICD10-I70.0). Electronically Signed   By: Bretta Bang III M.D.   On: 06/17/2018 15:27    EKG: Independently reviewed.  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    Cardiogram from October 2016 shows EF of 40% and depressed left ventricular systolic function and diastolic dysfunction  Assessment/Plan Principal Problem:   Elevated troponin Active Problems:   History of pulmonary embolism   Hypertension   Type 2 diabetes mellitus with complication, without long-term current use of insulin (HCC)   Bilateral lower extremity edema: chronic with venous stasis changes   Bipolar affective disorder, current episode manic with psychotic symptoms (HCC)   Protein-calorie malnutrition, severe (HCC)  1. Elevated troponin: Patient denies any chest pain and without the presence of chest pain I doubt that this is anginal related.  It may be related to strain.  Therefore the ER physician has ordered a CTA of the chest.  Of note though the patient is already anticoagulated and I do not think that additional anticoagulation would be of any benefit.  I have not ordered any stress testing as I do not believe that this would be of benefit in a patient without chest pain given his multiple other medical problems treated I have not started an aspirin as he is already anticoagulated.  2.  History of pulmonary embolism: Patient is already taking Xarelto.  Will continue this medication while hospitalized and monitor overnight.  3.  Hypertension: Continue home medication.  4.  Type 2 diabetes mellitus with complication without long-term current use of insulin: Patient has had cerebrovascular disease and peripheral vascular disease.  Continue home medications and add  sliding scale insulin coverage.  5.  Bilateral lower extremity edema chronic with venous stasis changes: Noted will monitor is on a diuretic.  6.  Bipolar affective disorder: Continue home medications.  7.  Protein calorie malnutrition severe patient uses supplements at home would continue that treatment.  We will ask nutrition to see the patient.   DVT prophylaxis: Anticoagulated Code Status: *Full code  likely needs to be addressed Family Communication: *With patient's ex-wife who cares for him and was present in the room on admission Disposition Plan: *Back home in am if family able to care for him otherwise will need skilled facility.  Case management consulted Consults called: None Admission status: Epigastric pain observation   Lahoma Crocker MD FACP [facility] Pager [phone]  If 7PM-7AM, please contact night-coverage [email or web address] Password [facility]  06/17/2018, 5:17 PM

## 2018-06-17 NOTE — ED Notes (Signed)
Dinner tray ordered for patient.

## 2018-06-17 NOTE — ED Notes (Signed)
Spoke to lab.  Need to send dark green top for Valproic, all others able to be added on

## 2018-06-18 DIAGNOSIS — E1122 Type 2 diabetes mellitus with diabetic chronic kidney disease: Secondary | ICD-10-CM | POA: Diagnosis not present

## 2018-06-18 DIAGNOSIS — R748 Abnormal levels of other serum enzymes: Secondary | ICD-10-CM | POA: Diagnosis not present

## 2018-06-18 DIAGNOSIS — R339 Retention of urine, unspecified: Secondary | ICD-10-CM | POA: Diagnosis not present

## 2018-06-18 DIAGNOSIS — I480 Paroxysmal atrial fibrillation: Secondary | ICD-10-CM | POA: Diagnosis present

## 2018-06-18 DIAGNOSIS — I129 Hypertensive chronic kidney disease with stage 1 through stage 4 chronic kidney disease, or unspecified chronic kidney disease: Secondary | ICD-10-CM | POA: Diagnosis not present

## 2018-06-18 DIAGNOSIS — R0600 Dyspnea, unspecified: Secondary | ICD-10-CM | POA: Diagnosis not present

## 2018-06-18 LAB — TROPONIN I
Troponin I: 0.54 ng/mL (ref <0.03)
Troponin I: 0.28 ng/mL

## 2018-06-18 LAB — BASIC METABOLIC PANEL WITH GFR
Anion gap: 11 (ref 5–15)
BUN: 13 mg/dL (ref 8–23)
CO2: 28 mmol/L (ref 22–32)
Calcium: 8.9 mg/dL (ref 8.9–10.3)
Chloride: 99 mmol/L (ref 98–111)
Creatinine, Ser: 1 mg/dL (ref 0.61–1.24)
GFR calc Af Amer: >60 mL/min
GFR calc non Af Amer: >60 mL/min
Glucose, Bld: 146 mg/dL — ABNORMAL HIGH (ref 70–99)
Potassium: 4.3 mmol/L (ref 3.5–5.1)
Sodium: 138 mmol/L (ref 135–145)

## 2018-06-18 LAB — CBC
HCT: 40.2 % (ref 39.0–52.0)
Hemoglobin: 13.2 g/dL (ref 13.0–17.0)
MCH: 28.9 pg (ref 26.0–34.0)
MCHC: 32.8 g/dL (ref 30.0–36.0)
MCV: 88.2 fL (ref 78.0–100.0)
Platelets: 137 K/uL — ABNORMAL LOW (ref 150–400)
RBC: 4.56 MIL/uL (ref 4.22–5.81)
RDW: 12.1 % (ref 11.5–15.5)
WBC: 4.9 K/uL (ref 4.0–10.5)

## 2018-06-18 LAB — URINE CULTURE: Culture: NO GROWTH

## 2018-06-18 MED ORDER — HYDROCHLOROTHIAZIDE 12.5 MG PO CAPS
12.5000 mg | ORAL_CAPSULE | Freq: Every day | ORAL | Status: DC
  Administered 2018-06-19 – 2018-06-23 (×5): 12.5 mg via ORAL
  Filled 2018-06-18 (×5): qty 1

## 2018-06-18 MED ORDER — LATANOPROST 0.005 % OP SOLN
1.0000 [drp] | Freq: Every day | OPHTHALMIC | Status: DC
  Administered 2018-06-18 – 2018-06-22 (×5): 1 [drp] via OPHTHALMIC
  Filled 2018-06-18: qty 2.5

## 2018-06-18 NOTE — Progress Notes (Signed)
Patient refusing to go for V/Q scan at this time. Dr. Mariea Clonts paged.

## 2018-06-18 NOTE — Progress Notes (Signed)
Troponin 0.54.  Reported to X. Blount NP.  No new orders given.  Patient denies any pain, nausea, dizziness or shortness of breath.

## 2018-06-18 NOTE — Progress Notes (Signed)
Pt has hx of DM type 2, pt on Metformin, may need CBG checks and S/S coverage ordered, thanks Glenna Fellows.

## 2018-06-18 NOTE — Progress Notes (Addendum)
MEDICATION RELATED NOTE    Pharmacy Re:  Home Meds  Assessment: Pharmacy has attempted to confirm his home medications.  His ex-wife had brought in a list from his bubble-pack meds provided by [facility] and neither the metoprolol or the aripiprazole was listed as on of his medications.   He also has Latanoprost which was unknown as to whether he was using them or not.  These were ordered upon admission but were not initially verified until his med-hx. could be completed.  He has had some blood pressure issues and received clonidine this AM.    Plan:  - I verified his Metoprolol XL since it was re-ordered but cannot confirm if patient was taking this until Monday when the pharmacy opens back up [facility] [employer]).  His daughter brought in his eye drops and he is currently using Latanoprost, however, there were two bottles with different directions.  I have entered the one that was filled last which was 1 drop in right eye at bedtime.  Medications:  Medications Prior to Admission  Medication Sig Dispense Refill Last Dose  . amLODipine (NORVASC) 10 MG tablet Take 10 mg by mouth every morning.    06/17/2018 at Unknown time  . atorvastatin (LIPITOR) 10 MG tablet Take 10 mg by mouth every morning.   06/17/2018 at Unknown time  . cloNIDine (CATAPRES) 0.2 MG tablet Take 0.2 mg by mouth 2 (two) times daily.   06/17/2018 at AM  . divalproex (DEPAKOTE) 500 MG DR tablet Take 1,000 mg by mouth at bedtime.   06/16/2018 at Unknown time  . FLUoxetine (PROZAC) 20 MG capsule Take 20 mg by mouth every morning.    06/17/2018 at Unknown time  . furosemide (LASIX) 20 MG tablet Take 20 mg by mouth every morning.   06/17/2018 at Unknown time  . hydrochlorothiazide (HYDRODIURIL) 25 MG tablet Take 25 mg by mouth every morning.    06/17/2018 at Unknown time  . metFORMIN (GLUCOPHAGE-XR) 500 MG 24 hr tablet Take 1,000 mg by mouth daily with breakfast.    06/17/2018 at Unknown time  . senna (SENOKOT) 8.6 MG TABS  tablet Take 1 tablet by mouth 2 (two) times daily.   06/17/2018 at AM  . ARIPiprazole (ABILIFY) 10 MG tablet Take 10 mg by mouth daily.   Not Taking at Unknown time  . Cholecalciferol (VITAMIN D3) 1000 units CHEW Chew 1,000 Units by mouth daily.   Not Taking at Unknown time  . latanoprost (XALATAN) 0.005 % ophthalmic solution Place 1 drop into the right eye at bedtime.   Not Taking at Unknown time  . metoprolol succinate (TOPROL-XL) 100 MG 24 hr tablet Take 100 mg by mouth 2 (two) times daily. Take with or immediately following a meal.   Not Taking at Unknown time  . protein supplement shake (PREMIER PROTEIN) LIQD Take 325 mLs (11 oz total) by mouth 2 (two) times daily between meals. (Patient not taking: Reported on 06/17/2018)  0 Not Taking at Unknown time  . rivaroxaban (XARELTO) 10 MG TABS tablet Take 10 mg by mouth every evening.   Not Taking at Unknown time      Chinita Greenland 06/18/2018,10:18 AM

## 2018-06-18 NOTE — Progress Notes (Signed)
Patient Demographics:    Christian [name], is a 67 y.o. male, DOB - 03-06-1951, HQI:[id number]  Admit date - 06/17/2018   Admitting Physician Lahoma Crocker, MD  Outpatient Primary MD for the patient is Christian James, MD  LOS - 0   Chief Complaint  Patient presents with  . Dysuria  . Hypertension        Subjective:    Acupuncturist today has no fevers, no emesis,  No chest pain, patient refused further imaging studies today  Assessment  & Plan :    Principal Problem:   Elevated troponin Active Problems:   Hypertension   Type 2 diabetes mellitus with complication, without long-term current use of insulin (HCC)   Bilateral lower extremity edema: chronic with venous stasis changes   Bipolar affective disorder, current episode manic with psychotic symptoms (HCC)   Protein-calorie malnutrition, severe (HCC)   History of pulmonary embolism   Paroxysmal atrial fibrillation [facility])  Brief summary 67 y.o. male with medical history significant of diabetes, chronic kidney disease, hypertension,  bipolar disease and bilateral pulmonary emboli to noncompliance with anticoagulation admitted on 06/17/2018 with multiple complaints   Plan:- 1)H/o  PE--- patient admits to noncompliance with Xarelto, restart Xarelto compliance advised, pt declines further chest imaging studies at this time  2)DM- Use Novolog/Humalog Sliding scale insulin with Accu-Cheks/Fingersticks as ordered   3)HTN-due to continue amlodipine 10 mg daily, clonidine 0.2 mg twice daily, along with metoprolol 100 mg twice daily, HCTZ to 12.5 mg daily  4)Bipolar Disorder--- patient with history of severe depression in the past requiring ECT therapy , mood disorder may be impacting patient's ability to be compliant with medical therapy, denies suicidal homicidal ideation or plan at this time, affect is kind of flat patient is kind of quiet, continue  Prozac and Depakote  5) history of paroxysmal atrial fibrillation--- continue metoprolol for rate control, patient is currently in sinus rhythm, Xarelto for stroke prophylaxis  6) elevated troponin noted--patient is chest pain-free at this time, EKG without ACS, continue metoprolol and Xarelto, patient declines further chest imaging studies, get echocardiogram to rule out significant regional wall motion normalities  Code Status : full    Disposition Plan  : TBD  DVT Prophylaxis  :  xarelto  Lab Results  Component Value Date   PLT 137 (L) 06/18/2018    Inpatient Medications  Scheduled Meds: . amLODipine  10 mg Oral Daily  . cloNIDine  0.2 mg Oral BID  . divalproex  1,000 mg Oral QHS  . docusate sodium  100 mg Oral BID  . FLUoxetine  20 mg Oral Daily  . hydrochlorothiazide  25 mg Oral Daily  . latanoprost  1 drop Right Eye QHS  . metoprolol succinate  100 mg Oral BID  . rivaroxaban  10 mg Oral QPM  . senna  1 tablet Oral BID  . sodium chloride flush  3 mL Intravenous Q12H   Continuous Infusions: PRN Meds:.acetaminophen **OR** acetaminophen, ondansetron **OR** ondansetron (ZOFRAN) IV, oxyCODONE    Anti-infectives (From admission, onward)   None        Objective:   Vitals:   06/17/18 2103 06/18/18 0029 06/18/18 0438 06/18/18 1143  BP: (!) 176/101 (!) 143/85 (!) 167/99 (!) 158/86  Pulse:  98 79 79 79  Resp: 20 20 18    Temp: 98.4 F (36.9 C) 98 F (36.7 C) 98.2 F (36.8 C) 98.3 F (36.8 C)  TempSrc: Oral Oral Oral Oral  SpO2: 100% 100% 100% 97%  Weight:   107.7 kg (237 lb 7 oz)   Height:        Wt Readings from Last 3 Encounters:  06/18/18 107.7 kg (237 lb 7 oz)  03/31/18 98.6 kg (217 lb 6 oz)  01/16/16 103 kg (227 lb)     Intake/Output Summary (Last 24 hours) at 06/18/2018 1835 Last data filed at 06/18/2018 1751 Gross per 24 hour  Intake 1820.76 ml  Output 3450 ml  Net -1629.24 ml     Physical Exam  Gen:- Awake Alert, calm HEENT:- [place].AT, No  sclera icterus Neck-Supple Neck,No JVD,.  Lungs-  CTAB , fair air movement CV- S1, S2 normal Abd-  +ve B.Sounds, Abd Soft, No tenderness,    Extremity/Skin:-Good pulses, skin is warm and dry Psych-affect is flat, oriented x3 Neuro-no new focal deficits, no tremors   Data Review:   Micro Results Recent Results (from the past 240 hour(s))  Urine culture     Status: None   Collection Time: 06/17/18  3:52 PM  Result Value Ref Range Status   Specimen Description URINE, CLEAN CATCH  Final   Special Requests NONE  Final   Culture   Final    NO GROWTH Performed at [facility], [address] [place], [place] [address]    Report Status 06/18/2018 FINAL  Final    Radiology Reports Dg Chest Port 1 View  Result Date: 06/17/2018 CLINICAL DATA:  Altered mental status.  History of hypertension. EXAM: PORTABLE CHEST 1 VIEW COMPARISON:  Chest radiograph August 31, 2015 and chest CT September 02, 2015 FINDINGS: There is no edema or consolidation. Heart is borderline prominent with pulmonary vascularity normal. No adenopathy. There is aortic atherosclerosis. There is degenerative change in each shoulder. IMPRESSION: Borderline cardiac prominence. No edema or consolidation. Aortic atherosclerosis. Aortic Atherosclerosis (ICD10-I70.0). Electronically Signed   By: Bretta Bang III M.D.   On: 06/17/2018 15:27     CBC Recent Labs  Lab 06/17/18 1254 06/18/18 0333  WBC 4.5 4.9  HGB 15.9 13.2  HCT 49.7 40.2  PLT 174 137*  MCV 90.0 88.2  MCH 28.8 28.9  MCHC 32.0 32.8  RDW 12.1 12.1  LYMPHSABS 1.2  --   MONOABS 0.5  --   EOSABS 0.1  --   BASOSABS 0.0  --     Chemistries  Recent Labs  Lab 06/17/18 1254 06/18/18 0333  NA 136 138  K 4.1 4.3  CL 95* 99  CO2 27 28  GLUCOSE 206* 146*  BUN 16 13  CREATININE 1.19 1.00  CALCIUM 9.6 8.9  AST 18  --   ALT 12  --   ALKPHOS 72  --   BILITOT 0.6  --     ------------------------------------------------------------------------------------------------------------------ No results for input(s): CHOL, HDL, LDLCALC, TRIG, CHOLHDL, LDLDIRECT in the last 72 hours.  Lab Results  Component Value Date   HGBA1C 7.5 (H) 09/01/2015   ------------------------------------------------------------------------------------------------------------------ No results for input(s): TSH, T4TOTAL, T3FREE, THYROIDAB in the last 72 hours.  Invalid input(s): FREET3 ------------------------------------------------------------------------------------------------------------------ No results for input(s): VITAMINB12, FOLATE, FERRITIN, TIBC, IRON, RETICCTPCT in the last 72 hours.  Coagulation profile No results for input(s): INR, PROTIME in the last 168 hours.  No results for input(s): DDIMER in the last 72 hours.  Cardiac Enzymes Recent Labs  Lab 06/17/18 2150 06/18/18 0333 06/18/18 0929  TROPONINI 0.44* 0.54* 0.28*   ------------------------------------------------------------------------------------------------------------------    Component Value Date/Time   BNP 50.7 06/17/2018 1254     Shon Hale M.D on 06/18/2018 at 6:35 PM   Go to [email or web address] - password [email or web address] for contact info  [facility] - Office  [phone]

## 2018-06-18 NOTE — Evaluation (Signed)
Physical Therapy Evaluation Patient Details Name: Christian [name] MRN: [id number] DOB: 1951/09/11 Today's Date: 06/18/2018   History of Present Illness  Christian [name] is a 67 y.o. male with medical history significant of diabetes, chronic kidney disease, hypertension,  bipolar disease and bilateral pulmonary emboli currently anticoagulated.  He is cared for by his ex-wife, his daughter and his sister.  Recently discharged in May and went to skilled nursing facility.  Since then he has been at home with the care of these 3 people.  He is inactive and spends much of his time in a recliner.   Clinical Impression   Pt admitted with above diagnosis. Pt currently with functional limitations due to the deficits listed below (see PT Problem List). Pt not forthcoming with information re: PLOF, and home situation and assist; Limited participation today, and Mr. Ledwith consistently refused any EOB or OOB activity; observed him rolling relatively well in the bed; Possible that mental illness is a major factor in ability to participate; Presents with decr functional mobility, and per conversation with pt, he has had falls in the home; At this point, we must consider SNF for physical rehab to maximize independence and safety with mobility;  Pt will benefit from skilled PT to increase their independence and safety with mobility to allow discharge to the venue listed below.    It sounds like his current living situation in unsustainable.  Time to have conversations re: longer-term safe living solutions like ALF, or increasing amount of assist in the home.     Follow Up Recommendations SNF;Supervision/Assistance - 24 hour    Equipment Recommendations  Other (comment)(to be determined)    Recommendations for Other Services OT consult(ordered per protocol)     Precautions / Restrictions Precautions Precautions: Fall      Mobility  Bed Mobility Overal bed mobility: Needs Assistance Bed Mobility:  Rolling Rolling: Supervision         General bed mobility comments: Observed pt roll R sidelying to back; grossly smooth rolling to reposition self in the bed; Pt did assist minimally with sliding up to [facility] by pulling on rails briefly before stopping ("I'm not doing anymore of that"); When asked which side of the bed he wanted to get up on, he answered, "neither"  Transfers                    Ambulation/Gait                Stairs            Wheelchair Mobility    Modified Rankin (Stroke Patients Only)       Balance Overall balance assessment: History of Falls                                           Pertinent Vitals/Pain Pain Assessment: Faces Faces Pain Scale: Hurts a little bit Pain Location: "all over" Pain Intervention(s): Monitored during session    Home Living Family/patient expects to be discharged to:: Private residence Living Arrangements: Other relatives Available Help at Discharge: Family;Friend(s);Available PRN/intermittently(they check in with pt once a day) Type of Home: Apartment Home Access: Stairs to enter Entrance Stairs-Rails: None Entrance Stairs-Number of Steps: a few Home Layout: One level Home Equipment: Walker - 4 wheels      Prior Function Level of Independence: Needs assistance   Gait / Transfers  Assistance Needed: with rollator, short distances     Comments: Pt not forthcoming with information re: home situation, available assist, and prior level of function; Most of home living and PLOF gleaned from chart review from admission at the end of April, and from discussion with nurse     Hand Dominance        Extremity/Trunk Assessment   Upper Extremity Assessment Upper Extremity Assessment: Generalized weakness;Defer to OT evaluation    Lower Extremity Assessment Lower Extremity Assessment: Generalized weakness(Bil hip, knee ROM WFL for bed mobility)       Communication   Communication:  Other (comment)(Softspoken)  Cognition Arousal/Alertness: Awake/alert Behavior During Therapy: Flat affect Overall Cognitive Status: No family/caregiver present to determine baseline cognitive functioning                                        General Comments      Exercises     Assessment/Plan    PT Assessment Patient needs continued PT services  PT Problem List Decreased strength;Decreased range of motion;Decreased activity tolerance;Decreased balance;Decreased mobility;Decreased cognition;Decreased knowledge of use of DME;Decreased safety awareness;Decreased knowledge of precautions       PT Treatment Interventions DME instruction;Gait training;Stair training;Functional mobility training;Therapeutic activities;Therapeutic exercise;Balance training;Neuromuscular re-education;Cognitive remediation;Patient/family education    PT Goals (Current goals can be found in the Care Plan section)  Acute Rehab PT Goals Patient Stated Goal: did not state PT Goal Formulation: Patient unable to participate in goal setting Time For Goal Achievement: 07/02/18 Potential to Achieve Goals: Fair    Frequency Min 2X/week   Barriers to discharge Decreased caregiver support My understanding is that Mr. Alejo is home alone, and his daughter, ex-wife, and sister each visit him once a day; He must be independent to be able to be safe at home    Co-evaluation               AM-PAC PT "6 Clicks" Daily Activity  Outcome Measure Difficulty turning over in bed (including adjusting bedclothes, sheets and blankets)?: None Difficulty moving from lying on back to sitting on the side of the bed? : Unable Difficulty sitting down on and standing up from a chair with arms (e.g., wheelchair, bedside commode, etc,.)?: Unable Help needed moving to and from a bed to chair (including a wheelchair)?: Total Help needed walking in hospital room?: Total Help needed climbing 3-5 steps with a  railing? : Total 6 Click Score: 9    End of Session Equipment Utilized During Treatment: (Bed pad) Activity Tolerance: Other (comment)(Refused OOB activity) Patient left: in bed;with call bell/phone within reach;with bed alarm set Nurse Communication: Other (comment)(Limited participation in PT eval) PT Visit Diagnosis: Muscle weakness (generalized) (M62.81);History of falling (Z91.81)    Time: 1354-1420 PT Time Calculation (min) (ACUTE ONLY): 26 min   Charges:   PT Evaluation $PT Eval Moderate Complexity: 1 Mod PT Treatments $Therapeutic Activity: 8-22 mins   PT G Codes:        Van Clines, PT  Acute Rehabilitation Services Pager [phone] Office [phone]   Levi Aland 06/18/2018, 4:22 PM

## 2018-06-19 ENCOUNTER — Observation Stay (HOSPITAL_BASED_OUTPATIENT_CLINIC_OR_DEPARTMENT_OTHER): Payer: Medicare PPO

## 2018-06-19 DIAGNOSIS — R0600 Dyspnea, unspecified: Secondary | ICD-10-CM

## 2018-06-19 DIAGNOSIS — R748 Abnormal levels of other serum enzymes: Secondary | ICD-10-CM | POA: Diagnosis not present

## 2018-06-19 LAB — ECHOCARDIOGRAM COMPLETE
Height: 72 in
Weight: 3707.26 oz

## 2018-06-19 LAB — GLUCOSE, CAPILLARY
Glucose-Capillary: 157 mg/dL — ABNORMAL HIGH (ref 70–99)
Glucose-Capillary: 146 mg/dL — ABNORMAL HIGH (ref 70–99)
Glucose-Capillary: 106 mg/dL — ABNORMAL HIGH (ref 70–99)

## 2018-06-19 MED ORDER — RIVAROXABAN 20 MG PO TABS
20.0000 mg | ORAL_TABLET | Freq: Every day | ORAL | Status: DC
  Administered 2018-06-19 – 2018-06-22 (×4): 20 mg via ORAL
  Filled 2018-06-19 (×5): qty 1

## 2018-06-19 MED ORDER — INSULIN ASPART 100 UNIT/ML ~~LOC~~ SOLN
0.0000 [IU] | Freq: Three times a day (TID) | SUBCUTANEOUS | Status: DC
  Administered 2018-06-19: 1 [IU] via SUBCUTANEOUS
  Administered 2018-06-20: 2 [IU] via SUBCUTANEOUS
  Administered 2018-06-20: 1 [IU] via SUBCUTANEOUS
  Administered 2018-06-21 – 2018-06-22 (×3): 2 [IU] via SUBCUTANEOUS
  Administered 2018-06-22: 1 [IU] via SUBCUTANEOUS
  Administered 2018-06-23: 2 [IU] via SUBCUTANEOUS

## 2018-06-19 NOTE — Progress Notes (Signed)
MD did u want to still d/c the VQ scan since pt did not want to have one, thanks Glenna Fellows.

## 2018-06-19 NOTE — NC FL2 (Signed)
[place] MEDICAID FL2 LEVEL OF CARE SCREENING TOOL     IDENTIFICATION  Patient Name: Christian [name] Birthdate: 09-15-1951 Sex: male Admission Date (Current Location): 06/17/2018  [facility] and [place] Number:  Producer, television/film/video and Address:  [facility]. [facility], [address], [place], [place] [address]      Provider Number: [id number]  Attending Physician Name and Address:  Shon Hale, MD  Relative Name and Phone Number:       Current Level of Care: Hospital Recommended Level of Care: Skilled Nursing Facility Prior Approval Number:    Date Approved/Denied:   PASRR Number: Manual review  Discharge Plan: SNF    Current Diagnoses: Patient Active Problem List   Diagnosis Date Noted  . Paroxysmal atrial fibrillation (HCC) 06/18/2018  . History of pulmonary embolism 06/17/2018  . Generalized weakness 03/27/2018  . Diabetes mellitus type 2 in nonobese (HCC) 03/27/2018  . Pulmonary embolism (HCC) 03/27/2018  . Fall   . Laceration of eyebrow   . Bipolar I disorder, most recent episode depressed, severe w psychosis (HCC) 12/22/2015  . Bipolar affective disorder, depressed, severe, with psychotic behavior (HCC)   . Bipolar I disorder, current or most recent episode depressed, with psychotic features (HCC)   . Bipolar I disorder, most recent episode depressed (HCC)   . Severe bipolar I disorder with depression (HCC)   . Bipolar I disorder, most recent episode depressed, severe with psychotic features (HCC)   . Overdose 09/11/2015  . Acute respiratory failure with hypoxia (HCC) 09/05/2015  . Elevated troponin 09/05/2015  . Somnolence 09/05/2015  . Bipolar depression (HCC) 09/05/2015  . Acute bilateral deep vein thrombosis (DVT) of femoral veins (HCC) 09/05/2015  . Bilateral pulmonary embolism (HCC) 09/02/2015  . Labile hypertension   . Pulmonary embolism with acute cor pulmonale (HCC)   . Dyspnea   . Orthostatic hypotension   . Bipolar disorder,  in partial remission, most recent episode depressed (HCC)   . Syncope, near 08/31/2015  . Affective psychosis, bipolar (HCC)   . Bipolar disorder, now depressed (HCC) 08/28/2015  . Bipolar I disorder, severe, current or most recent episode depressed, with psychotic features (HCC)   . Pressure ulcer 07/30/2015  . Protein-calorie malnutrition, severe (HCC) 07/30/2015  . Dehydration 07/29/2015  . Bipolar disorder, current episode depressed, severe, with psychotic features (HCC) 07/09/2015  . Diabetes (HCC) 07/09/2015  . UTI (urinary tract infection) 06/19/2014  . Positive blood culture 06/19/2014  . Bipolar affective disorder, current episode manic with psychotic symptoms (HCC) 06/17/2014  . Type 2 diabetes mellitus with complication, without long-term current use of insulin (HCC) 06/02/2014  . Other and unspecified hyperlipidemia 06/02/2014  . 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 Gout 06/02/2014  . Hypertension     Orientation RESPIRATION BLADDER Height & Weight     Self, Time, Situation, Place  Normal Continent, Indwelling catheter Weight: 231 lb 11.3 oz (105.1 kg) Height:  6' (182.9 cm)  BEHAVIORAL SYMPTOMS/MOOD NEUROLOGICAL BOWEL NUTRITION STATUS  Other (Comment)(Flat affect) (None) Continent Diet(Heart healthy)  AMBULATORY STATUS COMMUNICATION OF NEEDS Skin   Extensive Assist Verbally(Mostly nods his head) Normal                       Personal Care Assistance Level of Assistance  Bathing, Feeding, Dressing Bathing Assistance: Maximum assistance Feeding assistance: Limited assistance Dressing Assistance: Maximum assistance     Functional Limitations Info  Sight, Hearing, Speech Sight Info:  Adequate Hearing Info: Adequate Speech Info: Adequate    SPECIAL CARE FACTORS FREQUENCY  PT (By licensed PT), OT (By licensed OT), Blood pressure     PT Frequency: 5 x week OT Frequency: 5 x week            Contractures  Contractures Info: Not present    Additional Factors Info  Code Status, Allergies, Psychotropic Code Status Info: Full Allergies Info: Zyprexa (Olanzapine) Psychotropic Info: Bipolar: Depakote DR 1000 mg PO QHS, Prozac 20 mg PO daily,          Current Medications (06/19/2018):  This is the current hospital active medication list Current Facility-Administered Medications  Medication Dose Route Frequency Provider Last Rate Last Dose  . acetaminophen (TYLENOL) tablet 650 mg  650 mg Oral Q6H PRN Lahoma Crocker, MD       Or  . acetaminophen (TYLENOL) suppository 650 mg  650 mg Rectal Q6H PRN Lahoma Crocker, MD      . amLODipine (NORVASC) tablet 10 mg  10 mg Oral Daily Lahoma Crocker, MD   10 mg at 06/19/18 1012  . cloNIDine (CATAPRES) tablet 0.2 mg  0.2 mg Oral BID Lahoma Crocker, MD   0.2 mg at 06/19/18 1014  . divalproex (DEPAKOTE) DR tablet 1,000 mg  1,000 mg Oral QHS Lahoma Crocker, MD   1,000 mg at 06/18/18 2250  . docusate sodium (COLACE) capsule 100 mg  100 mg Oral BID Lahoma Crocker, MD   100 mg at 06/19/18 1014  . FLUoxetine (PROZAC) capsule 20 mg  20 mg Oral Daily Lahoma Crocker, MD   20 mg at 06/19/18 1014  . hydrochlorothiazide (MICROZIDE) capsule 12.5 mg  12.5 mg Oral Daily Emokpae, Courage, MD   12.5 mg at 06/19/18 1014  . insulin aspart (novoLOG) injection 0-9 Units  0-9 Units Subcutaneous TID WC Emokpae, Courage, MD      . latanoprost (XALATAN) 0.005 % ophthalmic solution 1 drop  1 drop Right Eye QHS Emokpae, Courage, MD   1 drop at 06/18/18 2300  . metoprolol succinate (TOPROL-XL) 24 hr tablet 100 mg  100 mg Oral BID Lahoma Crocker, MD   100 mg at 06/19/18 1014  . ondansetron (ZOFRAN) tablet 4 mg  4 mg Oral Q6H PRN Lahoma Crocker, MD       Or  . ondansetron [facility]) injection 4 mg  4 mg Intravenous Q6H PRN Lahoma Crocker, MD      . oxyCODONE (Oxy IR/ROXICODONE) immediate release tablet 5 mg  5 mg Oral Q4H PRN Lahoma Crocker, MD       . rivaroxaban Carlena Hurl) tablet 10 mg  10 mg Oral QPM Lahoma Crocker, MD   10 mg at 06/18/18 1757  . senna (SENOKOT) tablet 8.6 mg  1 tablet Oral BID Lahoma Crocker, MD   8.6 mg at 06/19/18 1014  . sodium chloride flush (NS) 0.9 % injection 3 mL  3 mL Intravenous Q12H Lahoma Crocker, MD   3 mL at 06/19/18 1015     Discharge Medications: Please see discharge summary for a list of discharge medications.  Relevant Imaging Results:  Relevant Lab Results:   Additional Information SS#: [id number]  Margarito Liner, LCSW

## 2018-06-19 NOTE — Progress Notes (Signed)
Patient Demographics:    Christian [name], is a 67 y.o. male, DOB - 22-Jan-1951, ZOX:[id number]  Admit date - 06/17/2018   Admitting Physician Lahoma Crocker, MD  Outpatient Primary MD for the patient is [name], MD  LOS - 0   Chief Complaint  Patient presents with  . Dysuria  . Hypertension        Subjective:    Acupuncturist today has no fevers, no emesis,  No chest pain,  Ex- wife at bedside, patient gave permission to discuss his care with his wife and also with his daughter  Assessment  & Plan :    Principal Problem:   Elevated troponin Active Problems:   Hypertension   Type 2 diabetes mellitus with complication, without long-term current use of insulin (HCC)   Bilateral lower extremity edema: chronic with venous stasis changes   Bipolar affective disorder, current episode manic with psychotic symptoms (HCC)   Protein-calorie malnutrition, severe (HCC)   History of pulmonary embolism   Paroxysmal atrial fibrillation [facility])  Brief summary 67 y.o. male with medical history significant of diabetes, chronic kidney disease, hypertension,  bipolar disease and bilateral pulmonary emboli to noncompliance with anticoagulation admitted on 06/17/2018 with multiple complaints, patient gave permission to discuss his care with his wife and also with his daughter, awaiting placement to skilled nursing facility   Plan:- 1)H/o  DVT/PE--- patient admits to noncompliance with Xarelto, c/n Xarelto 15 mg daily, compliance advised, pt declines further chest imaging studies at this time  2)DM2-no recent A1c available, Use Novolog/Humalog Sliding scale insulin with Accu-Cheks/Fingersticks as ordered   3)HTN-stable, continue amlodipine 10 mg daily, clonidine 0.2 mg twice daily, along with metoprolol XL 100 mg twice daily, c/n HCTZ to 12.5 mg daily  4)Bipolar Disorder--- patient with history of severe  depression in the past requiring ECT therapy , mood disorder is most likely impacting patient's ability to be compliant with medical therapy, denies suicidal homicidal ideation or plan at this time, affect is flat patient is kind of quiet, patient with significant anhedonia , psychiatric consultation requested,  continue Prozac and Depakote... Patient's judgment and insight appears to be somewhat poor  5)History of Paroxysmal Atrial Fibrillation--- stable, continue Metoprolol XL 100 mg bid for rate control, patient is currently in sinus rhythm, c/n Xarelto 15 mg daily for stroke prophylaxis  6)Elevated Troponin noted--patient is chest pain-free at this time, EKG without ACS, echocardiogram with preserved EF of 55%, no regional wall motion abnormalities, continue metoprolol xl 100 mg bid,  and Xarelto, patient declines further chest imaging studies  7)Social/Ethics- patient gave permission to discuss his care with his wife and also with his daughter, plan of care discussed with patient's wife, questions answered.  patient's judgment and insight appears to be somewhat poor, psychiatric consultation pending, mood disorder is most likely impacting patient's ability to be compliant with medical therapy  8)Generalized Weakness and Debility--- PT/OT eval appreciated, they recommend skilled nursing facility, after talking to ex-wife, patient is now agreeable to go to skilled nursing facility for rehab  Code Status : Full code   Disposition Plan  : SNF  DVT Prophylaxis  :  xarelto  Lab Results  Component Value Date   PLT 137 (L) 06/18/2018  Inpatient Medications  Scheduled Meds: . amLODipine  10 mg Oral Daily  . cloNIDine  0.2 mg Oral BID  . divalproex  1,000 mg Oral QHS  . docusate sodium  100 mg Oral BID  . FLUoxetine  20 mg Oral Daily  . hydrochlorothiazide  12.5 mg Oral Daily  . insulin aspart  0-9 Units Subcutaneous TID WC  . latanoprost  1 drop Right Eye QHS  . metoprolol succinate   100 mg Oral BID  . rivaroxaban  15 mg Oral QPM  . senna  1 tablet Oral BID  . sodium chloride flush  3 mL Intravenous Q12H   Continuous Infusions: PRN Meds:.acetaminophen **OR** acetaminophen, ondansetron **OR** ondansetron (ZOFRAN) IV, oxyCODONE    Anti-infectives (From admission, onward)   None        Objective:   Vitals:   06/19/18 0521 06/19/18 1012 06/19/18 1014 06/19/18 1412  BP: 139/84 (!) 155/85  134/83  Pulse: (!) 55  78 68  Resp: 18   18  Temp: 98.1 F (36.7 C)   99.1 F (37.3 C)  TempSrc: Oral   Oral  SpO2: 98%   96%  Weight: 105.1 kg (231 lb 11.3 oz)     Height:        Wt Readings from Last 3 Encounters:  06/19/18 105.1 kg (231 lb 11.3 oz)  03/31/18 98.6 kg (217 lb 6 oz)  01/16/16 103 kg (227 lb)     Intake/Output Summary (Last 24 hours) at 06/19/2018 1726 Last data filed at 06/19/2018 1300 Gross per 24 hour  Intake 600 ml  Output 3500 ml  Net -2900 ml     Physical Exam  Gen:- Awake Alert, calm, somewhat withdrawn HEENT:- [place].AT, No sclera icterus Neck-Supple Neck,No JVD,.  Lungs-  CTAB , fair and symmetrical air movement CV- S1, S2 normal, regular despite history of A. fib Abd-  +ve B.Sounds, Abd Soft, No tenderness,    Extremity/Skin:-Good pulses, skin is warm and dry Psych-affect is flat, oriented x3, judgment and insight appears to be somewhat poor, Neuro-generalized weakness, no new focal deficits, no tremors   Data Review:   Micro Results Recent Results (from the past 240 hour(s))  Urine culture     Status: None   Collection Time: 06/17/18  3:52 PM  Result Value Ref Range Status   Specimen Description URINE, CLEAN CATCH  Final   Special Requests NONE  Final   Culture   Final    NO GROWTH Performed at [facility], [address], [place], [place] [address]    Report Status 06/18/2018 FINAL  Final    Radiology Reports Dg Chest Port 1 View  Result Date: 06/17/2018 CLINICAL DATA:  Altered mental status.  History of  hypertension. EXAM: PORTABLE CHEST 1 VIEW COMPARISON:  Chest radiograph August 31, 2015 and chest CT September 02, 2015 FINDINGS: There is no edema or consolidation. Heart is borderline prominent with pulmonary vascularity normal. No adenopathy. There is aortic atherosclerosis. There is degenerative change in each shoulder. IMPRESSION: Borderline cardiac prominence. No edema or consolidation. Aortic atherosclerosis. Aortic Atherosclerosis (ICD10-I70.0). Electronically Signed   By: Bretta Bang III M.D.   On: 06/17/2018 15:27     CBC Recent Labs  Lab 06/17/18 1254 06/18/18 0333  WBC 4.5 4.9  HGB 15.9 13.2  HCT 49.7 40.2  PLT 174 137*  MCV 90.0 88.2  MCH 28.8 28.9  MCHC 32.0 32.8  RDW 12.1 12.1  LYMPHSABS 1.2  --   MONOABS 0.5  --  EOSABS 0.1  --   BASOSABS 0.0  --     Chemistries  Recent Labs  Lab 06/17/18 1254 06/18/18 0333  NA 136 138  K 4.1 4.3  CL 95* 99  CO2 27 28  GLUCOSE 206* 146*  BUN 16 13  CREATININE 1.19 1.00  CALCIUM 9.6 8.9  AST 18  --   ALT 12  --   ALKPHOS 72  --   BILITOT 0.6  --    ------------------------------------------------------------------------------------------------------------------ No results for input(s): CHOL, HDL, LDLCALC, TRIG, CHOLHDL, LDLDIRECT in the last 72 hours.  Lab Results  Component Value Date   HGBA1C 7.5 (H) 09/01/2015   ------------------------------------------------------------------------------------------------------------------ No results for input(s): TSH, T4TOTAL, T3FREE, THYROIDAB in the last 72 hours.  Invalid input(s): FREET3 ------------------------------------------------------------------------------------------------------------------ No results for input(s): VITAMINB12, FOLATE, FERRITIN, TIBC, IRON, RETICCTPCT in the last 72 hours.  Coagulation profile No results for input(s): INR, PROTIME in the last 168 hours.  No results for input(s): DDIMER in the last 72 hours.  Cardiac Enzymes Recent Labs    Lab 06/17/18 2150 06/18/18 0333 06/18/18 0929  TROPONINI 0.44* 0.54* 0.28*   ------------------------------------------------------------------------------------------------------------------    Component Value Date/Time   BNP 50.7 06/17/2018 1254     Shon Hale M.D on 06/19/2018 at 5:26 PM   Go to [email or web address] - password [email or web address] for contact info  [facility] - Office  [phone]

## 2018-06-19 NOTE — Clinical Social Work Note (Signed)
Clinical Social Work Assessment  Patient Details  Name: Christian [name] MRN: [id number] Date of Birth: 07-Apr-1951  Date of referral:  06/19/18               Reason for consult:  Facility Placement, Discharge Planning                Permission sought to share information with:  Facility Sport and exercise psychologist, Family Supports Permission granted to share information::  Yes, Verbal Permission Granted  Name::     [name] and Corporate treasurer::     Relationship::  Ex-wife and daughter  Sport and exercise psychologist Information:  [name]: [phone], Sharl Ma: [phone]  Housing/Transportation Living arrangements for the past 2 months:  Apartment Source of Information:  Patient, Medical Team, Adult Children, Other (Comment Required)(Ex-wife) Patient Interpreter Needed:  None Criminal Activity/Legal Involvement Pertinent to Current Situation/Hospitalization:  No - Comment as needed Significant Relationships:  Adult Children, Other(Comment)(Ex-wife) Lives with:  Self Do you feel safe going back to the place where you live?  Yes Need for family participation in patient care:  Yes (Comment)  Care giving concerns:  PT recommending SNF once medically stable for discharge. Psych has also been consulted.   Social Worker assessment / plan:  CSW met with patient. No supports at bedside. CSW introduced role and explained that PT recommendations would be discussed. Patient is not interested in SNF placement. He went to [facility] SN[facility] from 5/3-5/16.He only spoke to CSW once to say he lived alone. Otherwise he would answer in very small head nods that you have to watch carefully for. Patient gave CSW permission to contact his daughter regarding potentially going home at discharge. Patient's daughter would prefer for patient to go to SNF but stated if he does return home, he will need home health. Patient had previously been getting home health therapy through [facility] but it was stopped approximately  2 weeks ago and she is unsure if they would take him back. Patient's daughter stated her mother would arrive to the hospital later in the day and would follow up with CSW on SNF after she spoke with him. CSW received call from patient's ex-wife stating she had spoken with MD and had almost convinced patient to go to SNF. She will continue talking to him about it and call CSW once he has agreed. She stated MD has also placed psych consult. Per MD this if for potential inpatient placement. No further concerns. CSW encouraged patient and his family to contact CSW as needed. CSW will continue to follow patient and his family for support and facilitate discharge to SNF vs. Inpatient psych facility once medically stable.  Employment status:  Disabled (Comment on whether or not currently receiving Disability) Insurance information:  Managed Medicare PT Recommendations:  [place] / Referral to community resources:  [place]  Patient/Family's Response to care:  Patient refusing SNF but daughter and ex-wife agreeable. Patient's family supportive and involved in patient's care. Patient's family appreciated social work intervention.  Patient/Family's Understanding of and Emotional Response to Diagnosis, Current Treatment, and Prognosis:  Patient barely responded to CSW during assessment. Patient's daughter and ex-wife have a good understanding of the reason for admission and his need for rehab prior to returning home alone. Patient's daughter and ex-wife appear happy with hospital care.  Emotional Assessment Appearance:  Appears stated age Attitude/Demeanor/Rapport:  Other(Very flat affect) Affect (typically observed):  Depressed, Flat, Guarded, Quiet Orientation:  Oriented to Place, Oriented to  Self, Oriented to  Time, Oriented to Situation Alcohol / Substance use:  Never Used Psych involvement (Current and /or in the community):  No (Comment)(They have been  consulted.)  Discharge Needs  Concerns to be addressed:  Care Coordination Readmission within the last 30 days:  No Current discharge risk:  Dependent with Mobility, Lives alone, Psychiatric Illness Barriers to Discharge:  Continued Medical Work up, [place], LCSW 06/19/2018, 3:49 PM

## 2018-06-19 NOTE — Evaluation (Signed)
Occupational Therapy Evaluation Patient Details Name: Christian [name] MRN: [id number] DOB: January 13, 1951 Today's Date: 06/19/2018    History of Present Illness 67 y.o. male presenting to ED for weakness and increased urine output with foul odor. Pt with medical history significant of diabetes, chronic kidney disease, hypertension,  bipolar disease, and bilateral pulmonary emboli currently anticoagulated. Recently discharged in May and went to skilled nursing facility.  Since then, pt has returned home with ex-wife, daughter, and sister.   Clinical Impression   Per chart review, pt was living with family, performing BADLs, and used rollator for functional mobility. Pt with decreased engagement and not forthcoming with home set up and PLOF. Pt requiring Min A for UB ADLs, Max A for LB ADLs, and Mod A for sit<>stand transfer with Min Guard for mobility with RW. Pt requiring increased encouragement for participation. Pt would benefit from further acute OT to facilitate safe dc. Recommend dc to SNF for further OT to optimize safety, independence with ADLs, and return to PLOF.      Follow Up Recommendations  SNF    Equipment Recommendations  None recommended by OT    Recommendations for Other Services PT consult     Precautions / Restrictions Precautions Precautions: Fall      Mobility Bed Mobility Overal bed mobility: Needs Assistance Bed Mobility: Supine to Sit     Supine to sit: Min assist     General bed mobility comments: Min A for initation of BLEs movement and to elevate trunk. Once movement initated, pt able to perform without physical A  Transfers                      Balance Overall balance assessment: History of Falls                                         ADL either performed or assessed with clinical judgement   ADL Overall ADL's : Needs assistance/impaired Eating/Feeding: Set up;Sitting   Grooming: Set up;Supervision/safety;Sitting    Upper Body Bathing: Minimal assistance;Sitting   Lower Body Bathing: Maximal assistance;Sit to/from stand   Upper Body Dressing : Minimal assistance;Sitting   Lower Body Dressing: Maximal assistance;Sit to/from stand Lower Body Dressing Details (indicate cue type and reason): Max A for donning socks Toilet Transfer: Moderate assistance;Ambulation;RW(Simulated to recliner) Toilet Transfer Details (indicate cue type and reason): Mod A for power up into standing from EOB. Pt with decreased engagment         Functional mobility during ADLs: Min guard;Rolling walker General ADL Comments: Pt with decreased participation and requiring Max encarougement for OOB activity.      Vision         Perception     Praxis      Pertinent Vitals/Pain Pain Assessment: Faces Faces Pain Scale: Hurts a little bit Pain Location: "all over" Pain Intervention(s): Monitored during session     Hand Dominance     Extremity/Trunk Assessment Upper Extremity Assessment Upper Extremity Assessment: Generalized weakness   Lower Extremity Assessment Lower Extremity Assessment: Generalized weakness       Communication Communication Communication: Other (comment)(Softspoken)   Cognition Arousal/Alertness: Awake/alert Behavior During Therapy: Flat affect Overall Cognitive Status: No family/caregiver present to determine baseline cognitive functioning  General Comments: Baseline biplor diagnosis. Pt with decreased participation and requiring Max encouragement to participate in therapy.   General Comments  RN present throughout session    Exercises     Shoulder Instructions      Home Living Family/patient expects to be discharged to:: Private residence Living Arrangements: Other relatives Available Help at Discharge: Family;Friend(s);Available PRN/intermittently(they check in with pt once a day) Type of Home: Apartment Home Access: Stairs to  enter [employer] of Steps: a few Entrance Stairs-Rails: None Home Layout: One level     Bathroom Shower/Tub: Chief Strategy Officer: Standard     Home Equipment: Environmental consultant - 4 wheels   Additional Comments: Information collected from chart review. Pt confirming some information but not forth coming with PLOF and home set up      Prior Functioning/Environment Level of Independence: Needs assistance  Gait / Transfers Assistance Needed: with rollator, short distances ADL's / Homemaking Assistance Needed: ADLs   Comments: Pt not forthcoming with information re: home situation, available assist, and prior level of function; Most of home living and PLOF gleaned from chart review from admission at the end of April, and from discussion with nurse. He is cared for by his ex-wife, his daughter and his sister.  Recently discharged in May and went to skilled nursing facility.  Since then he has been at home with the care of these 3 people.  He is inactive and spends much of his time in a recliner.         OT Problem List: Decreased strength;Decreased range of motion;Decreased activity tolerance;Impaired balance (sitting and/or standing);Decreased safety awareness;Decreased knowledge of use of DME or AE;Decreased knowledge of precautions      OT Treatment/Interventions: Self-care/ADL training;Therapeutic exercise;Energy conservation;DME and/or AE instruction;Therapeutic activities;Patient/family education    OT Goals(Current goals can be found in the care plan section) Acute Rehab OT Goals Patient Stated Goal: did not state OT Goal Formulation: With patient Time For Goal Achievement: 07/03/18 Potential to Achieve Goals: Good ADL Goals Pt Will Perform Grooming: standing;with set-up;with supervision Pt Will Perform Lower Body Dressing: with set-up;with supervision;sit to/from stand Pt Will Transfer to Toilet: with set-up;with supervision;ambulating;bedside commode Pt Will  Perform Toileting - Clothing Manipulation and hygiene: with set-up;with supervision;sit to/from stand  OT Frequency: Min 2X/week   Barriers to D/C:            Co-evaluation              AM-PAC PT "6 Clicks" Daily Activity     Outcome Measure Help from another person eating meals?: None Help from another person taking care of personal grooming?: A Little Help from another person toileting, which includes using toliet, bedpan, or urinal?: A Little Help from another person bathing (including washing, rinsing, drying)?: A Lot Help from another person to put on and taking off regular upper body clothing?: A Little Help from another person to put on and taking off regular lower body clothing?: A Lot 6 Click Score: 17   End of Session Equipment Utilized During Treatment: Gait belt;Rolling walker Nurse Communication: Mobility status  Activity Tolerance: Other (comment)(Limited by depressive behavior) Patient left: in chair;with call bell/phone within reach;with chair alarm set;with nursing/sitter in room  OT Visit Diagnosis: Unsteadiness on feet (R26.81);Other abnormalities of gait and mobility (R26.89);Muscle weakness (generalized) (M62.81)                Time: 1700-1749 OT Time Calculation (min): 17 min Charges:  OT General Charges $OT Visit:  1 Visit OT Evaluation $OT Eval Moderate Complexity: 1 Mod G-Codes:     Alucard Fearnow MSOT, OTR/L Acute Rehab Pager: [phone] Office: [phone]  Theodoro Grist Orlandis Sanden 06/19/2018, 12:17 PM

## 2018-06-19 NOTE — Plan of Care (Signed)
  Problem: Activity: Goal: Risk for activity intolerance will decrease Outcome: Progressing   Problem: Coping: Goal: Level of anxiety will decrease Outcome: Progressing   Problem: Elimination: Goal: Will not experience complications related to urinary retention Outcome: Progressing   Problem: Nutrition: Goal: Adequate nutrition will be maintained Outcome: Progressing   Problem: Health Behavior/Discharge Planning: Goal: Ability to manage health-related needs will improve Outcome: Progressing

## 2018-06-19 NOTE — Clinical Social Work Note (Signed)
Patient agreeable to SNF placement. Patient's ex-wife will review SNF list and provide CSW with preferences. She is going out of town tomorrow and asked that information be given to daughter while she is gone.  Charlynn Court, CSW [phone]

## 2018-06-19 NOTE — Progress Notes (Signed)
  Echocardiogram 2D Echocardiogram has been performed.  Belva Chimes 06/19/2018, 1:40 PM

## 2018-06-20 DIAGNOSIS — Z810 Family history of intellectual disabilities: Secondary | ICD-10-CM

## 2018-06-20 DIAGNOSIS — F314 Bipolar disorder, current episode depressed, severe, without psychotic features: Secondary | ICD-10-CM

## 2018-06-20 DIAGNOSIS — F3130 Bipolar disorder, current episode depressed, mild or moderate severity, unspecified: Secondary | ICD-10-CM | POA: Diagnosis not present

## 2018-06-20 DIAGNOSIS — I82409 Acute embolism and thrombosis of unspecified deep veins of unspecified lower extremity: Secondary | ICD-10-CM

## 2018-06-20 DIAGNOSIS — Z818 Family history of other mental and behavioral disorders: Secondary | ICD-10-CM

## 2018-06-20 LAB — GLUCOSE, CAPILLARY
Glucose-Capillary: 112 mg/dL — ABNORMAL HIGH (ref 70–99)
Glucose-Capillary: 196 mg/dL — ABNORMAL HIGH (ref 70–99)
Glucose-Capillary: 150 mg/dL — ABNORMAL HIGH (ref 70–99)
Glucose-Capillary: 177 mg/dL — ABNORMAL HIGH (ref 70–99)

## 2018-06-20 MED ORDER — BUPROPION HCL ER (XL) 150 MG PO TB24
150.0000 mg | ORAL_TABLET | Freq: Every day | ORAL | Status: DC
  Administered 2018-06-20 – 2018-06-23 (×4): 150 mg via ORAL
  Filled 2018-06-20 (×5): qty 1

## 2018-06-20 NOTE — Discharge Instructions (Signed)

## 2018-06-20 NOTE — Progress Notes (Signed)
Physical Therapy Treatment Patient Details Name: Christian [name] MRN: [id number] DOB: Mar 24, 1951 Today's Date: 06/20/2018    History of Present Illness 67 y.o. male presenting to ED for weakness and increased urine output with foul odor with elevated troponin. PMHx: DM, CKD, bipolar, HTN    PT Comments    Pt with very flat affect, extremely slow to respond to questions or commands with very self-limiting behavior. Pt stating he can't but then was able to demonstrate transfers and gait with min assist with RW. Pt educated for need to increased mobility, HEP and function but no demonstration of awareness or desire to improve function. Pt limited by cognition and will continue to follow.     Follow Up Recommendations  SNF;Supervision/Assistance - 24 hour     Equipment Recommendations       Recommendations for Other Services       Precautions / Restrictions Precautions Precautions: Fall Restrictions Weight Bearing Restrictions: No    Mobility  Bed Mobility               General bed mobility comments: in chair on arrival  Transfers Overall transfer level: Needs assistance   Transfers: Sit to/from Stand;Stand Pivot Transfers Sit to Stand: Min assist Stand pivot transfers: Min assist;+2 safety/equipment       General transfer comment: min assist to rise from recliner and BSC, minguard with rail to rise from toilet. 4 trials of sit to stand. Pivot with RW bed to chair with +2 for safety   Ambulation/Gait Ambulation/Gait assistance: Min assist Gait Distance (Feet): 20 Feet Assistive device: Rolling walker (2 wheeled) Gait Pattern/deviations: Step-through pattern;Decreased stride length;Trunk flexed   Gait velocity interpretation: 1.31 - 2.62 ft/sec, indicative of limited community ambulator General Gait Details: pt with max cues and encouragement to increase gait distance, required verbal and visual cues with chair follow initially and then removal of chair to  advance distance. Pt walked 20' then 12' after seated rest at toilet   Stairs             Wheelchair Mobility    Modified Rankin (Stroke Patients Only)       Balance Overall balance assessment: Needs assistance   Sitting balance-Leahy Scale: Fair     Standing balance support: Bilateral upper extremity supported Standing balance-Leahy Scale: Poor Standing balance comment: RW with bil UE support in standing                            Cognition Arousal/Alertness: Awake/alert Behavior During Therapy: Flat affect Overall Cognitive Status: No family/caregiver present to determine baseline cognitive functioning                                 General Comments: pt with very flat affect, extremely self limiting stating "I can't do it" before ever attempting to try. Max encouragement for participation      Exercises General Exercises - Lower Extremity Long Arc Quad: AROM;10 reps;Seated;Both Hip Flexion/Marching: AROM;10 reps;Seated;Both    General Comments        Pertinent Vitals/Pain Pain Assessment: No/denies pain    Home Living                      Prior Function            PT Goals (current goals can now be found in the care plan section) Progress towards  PT goals: Progressing toward goals    Frequency    Min 2X/week      PT Plan Current plan remains appropriate    Co-evaluation              AM-PAC PT "6 Clicks" Daily Activity  Outcome Measure  Difficulty turning over in bed (including adjusting bedclothes, sheets and blankets)?: None Difficulty moving from lying on back to sitting on the side of the bed? : A Little Difficulty sitting down on and standing up from a chair with arms (e.g., wheelchair, bedside commode, etc,.)?: Unable Help needed moving to and from a bed to chair (including a wheelchair)?: A Little Help needed walking in hospital room?: A Little Help needed climbing 3-5 steps with a railing?  : A Lot 6 Click Score: 16    End of Session Equipment Utilized During Treatment: Gait belt Activity Tolerance: Patient tolerated treatment well Patient left: in chair;with call bell/phone within reach;with chair alarm set Nurse Communication: Mobility status PT Visit Diagnosis: Muscle weakness (generalized) (M62.81);History of falling (Z91.81)     Time: 2023-3435 PT Time Calculation (min) (ACUTE ONLY): 24 min  Charges:  $Gait Training: 8-22 mins $Therapeutic Exercise: 8-22 mins                    G Codes:       Delaney Meigs, PT [phone]    Kadence Mimbs B Gregroy Dombkowski 06/20/2018, 11:03 AM

## 2018-06-20 NOTE — Clinical Social Work Note (Addendum)
[facility] under manual review. Per patient's daughter, preference SNF's are [facility], [facility], and [facility]. First preference is [facility] and they are able to offer a bed once patient is ready for discharge.  Charlynn Court, CSW [phone]  4:47 pm CSW faxed clinicals to [facility] for authorization review.  Charlynn Court, CSW [phone]

## 2018-06-20 NOTE — Progress Notes (Signed)
Patient Demographics:    Christian [name], is a 67 y.o. male, DOB - [birth year], ZOX:[id number]  Admit date - 06/17/2018   Admitting Physician Lahoma Crocker, MD  Outpatient Primary MD for the patient is [name], MD  LOS - 0   Chief Complaint  Patient presents with  . Dysuria  . Hypertension        Subjective:    Acupuncturist today has no fevers, no emesis,  No chest pain,   was cooperative earlier in the day,  ambulated with therapist, later in the day he refused to get up or ambulate  Assessment  & Plan :    Principal Problem:   Bipolar I disorder, most recent episode depressed (HCC) Active Problems:   Hypertension   Type 2 diabetes mellitus with complication, without long-term current use of insulin (HCC)   Bilateral lower extremity edema: chronic with venous stasis changes   Bipolar affective disorder, current episode manic with psychotic symptoms (HCC)   Protein-calorie malnutrition, severe (HCC)   Bipolar disorder, in partial remission, most recent episode depressed (HCC)   Elevated troponin   History of pulmonary embolism   Paroxysmal atrial fibrillation [facility])  Brief Summary 67 y.o. male with medical history significant of diabetes, chronic kidney disease, hypertension,  bipolar disease and bilateral pulmonary emboli to noncompliance with anticoagulation admitted on 06/17/2018 with multiple complaints, patient gave permission to discuss his care with his wife and also with his daughter, awaiting PASARR number prior to SNF placement   Plan:- 1)H/o  DVT/PE--- largely asymptomatic at this time patient admits to noncompliance with Xarelto, c/n Xarelto 20 mg daily, compliance advised, pt declines further chest imaging studies at this time  2)DM2-no recent A1c available, Use Novolog/Humalog Sliding scale insulin with Accu-Cheks/Fingersticks as ordered   3)HTN-stable, continue amlodipine  10 mg daily, clonidine 0.2 mg twice daily, along with metoprolol XL 100 mg twice daily, c/n HCTZ to 12.5 mg daily  4)Bipolar Disorder--- patient with history of severe depression in the past requiring ECT therapy , mood disorder is most likely impacting patient's ability to be compliant with medical therapy, denies suicidal homicidal ideation or plan at this time, affect is flat patient is kind of quiet, patient with significant anhedonia , psychiatric consultation appreciated,  Dr Sharma Covert recommends stopping Prozac, starting Wellbutrin XL 150 mg daily, okay to continue Depakote 1000 mg... Patient's judgment and insight appears to be somewhat poor  5)History of Paroxysmal Atrial Fibrillation--- stable, continue Metoprolol XL 100 mg bid for rate control, patient is currently in sinus rhythm, c/n Xarelto 20 mg daily for stroke prophylaxis  6)Elevated Troponin noted--patient is chest pain-free at this time, EKG without ACS, echocardiogram with preserved EF of 55%, no regional wall motion abnormalities, continue metoprolol xl 100 mg bid,  and Xarelto, patient declines further chest imaging studies  7)Social/Ethics- patient gave permission to discuss his care with his wife and also with his daughter, plan of care discussed with patient's wife, questions answered.  patient's judgment and insight appears to be somewhat poor, psychiatric consultation appreciated mood disorder is most likely impacting patient's ability to be compliant with medical therapy  8)Generalized Weakness and Debility--- PT/OT eval appreciated, they recommend skilled nursing facility, after talking to ex-wife, patient is now agreeable  to go to skilled nursing facility for rehab, awaiting PASARR number prior to SNF placement  Code Status : Full code   Disposition Plan  : SNF  DVT Prophylaxis  :  xarelto  Lab Results  Component Value Date   PLT 137 (L) 06/18/2018    Inpatient Medications  Scheduled Meds: . amLODipine  10 mg  Oral Daily  . buPROPion  150 mg Oral Daily  . cloNIDine  0.2 mg Oral BID  . divalproex  1,000 mg Oral QHS  . docusate sodium  100 mg Oral BID  . hydrochlorothiazide  12.5 mg Oral Daily  . insulin aspart  0-9 Units Subcutaneous TID WC  . latanoprost  1 drop Right Eye QHS  . metoprolol succinate  100 mg Oral BID  . rivaroxaban  20 mg Oral Q supper  . senna  1 tablet Oral BID  . sodium chloride flush  3 mL Intravenous Q12H   Continuous Infusions: PRN Meds:.acetaminophen **OR** acetaminophen, ondansetron **OR** ondansetron (ZOFRAN) IV, oxyCODONE    Anti-infectives (From admission, onward)   None        Objective:   Vitals:   06/19/18 2126 06/20/18 0531 06/20/18 0813 06/20/18 1136  BP: (!) 151/99 (!) 154/101 (!) 140/97 122/76  Pulse: 67 76 74 66  Resp:  18 20 (!) 21  Temp:  98.6 F (37 C) 99.1 F (37.3 C) 98.2 F (36.8 C)  TempSrc:  Oral Oral Oral  SpO2:  96% 99% 95%  Weight:  103.7 kg (228 lb 9.9 oz)    Height:        Wt Readings from Last 3 Encounters:  06/20/18 103.7 kg (228 lb 9.9 oz)  03/31/18 98.6 kg (217 lb 6 oz)  12/21/15 104.3 kg (230 lb)     Intake/Output Summary (Last 24 hours) at 06/20/2018 1807 Last data filed at 06/20/2018 1806 Gross per 24 hour  Intake 1513 ml  Output 2050 ml  Net -537 ml     Physical Exam  Gen:- Awake Alert, calm, somewhat withdrawn HEENT:- [place].AT, No sclera icterus Neck-Supple Neck,No JVD,.  Lungs-  CTAB , fair and symmetrical air movement CV- S1, S2 normal, regular despite history of A. fib Abd-  +ve B.Sounds, Abd Soft, No tenderness,    Extremity/Skin:-Good pulses, skin is warm and dry Psych-affect is flat, oriented x3, judgment and insight appears to be somewhat poor, Neuro-generalized weakness, no new focal deficits, no tremors   Data Review:   Micro Results Recent Results (from the past 240 hour(s))  Urine culture     Status: None   Collection Time: 06/17/18  3:52 PM  Result Value Ref Range Status   Specimen  Description URINE, CLEAN CATCH  Final   Special Requests NONE  Final   Culture   Final    NO GROWTH Performed at [facility], [address] [place], [place] [address]    Report Status 06/18/2018 FINAL  Final    Radiology Reports Dg Chest Port 1 View  Result Date: 06/17/2018 CLINICAL DATA:  Altered mental status.  History of hypertension. EXAM: PORTABLE CHEST 1 VIEW COMPARISON:  Chest radiograph August 31, 2015 and chest CT September 02, 2015 FINDINGS: There is no edema or consolidation. Heart is borderline prominent with pulmonary vascularity normal. No adenopathy. There is aortic atherosclerosis. There is degenerative change in each shoulder. IMPRESSION: Borderline cardiac prominence. No edema or consolidation. Aortic atherosclerosis. Aortic Atherosclerosis (ICD10-I70.0). Electronically Signed   By: Bretta Bang III M.D.   On:  06/17/2018 15:27     CBC Recent Labs  Lab 06/17/18 1254 06/18/18 0333  WBC 4.5 4.9  HGB 15.9 13.2  HCT 49.7 40.2  PLT 174 137*  MCV 90.0 88.2  MCH 28.8 28.9  MCHC 32.0 32.8  RDW 12.1 12.1  LYMPHSABS 1.2  --   MONOABS 0.5  --   EOSABS 0.1  --   BASOSABS 0.0  --     Chemistries  Recent Labs  Lab 06/17/18 1254 06/18/18 0333  NA 136 138  K 4.1 4.3  CL 95* 99  CO2 27 28  GLUCOSE 206* 146*  BUN 16 13  CREATININE 1.19 1.00  CALCIUM 9.6 8.9  AST 18  --   ALT 12  --   ALKPHOS 72  --   BILITOT 0.6  --    ------------------------------------------------------------------------------------------------------------------ No results for input(s): CHOL, HDL, LDLCALC, TRIG, CHOLHDL, LDLDIRECT in the last 72 hours.  Lab Results  Component Value Date   HGBA1C 7.5 (H) 09/01/2015   ------------------------------------------------------------------------------------------------------------------ No results for input(s): TSH, T4TOTAL, T3FREE, THYROIDAB in the last 72 hours.  Invalid input(s):  FREET3 ------------------------------------------------------------------------------------------------------------------ No results for input(s): VITAMINB12, FOLATE, FERRITIN, TIBC, IRON, RETICCTPCT in the last 72 hours.  Coagulation profile No results for input(s): INR, PROTIME in the last 168 hours.  No results for input(s): DDIMER in the last 72 hours.  Cardiac Enzymes Recent Labs  Lab 06/17/18 2150 06/18/18 0333 06/18/18 0929  TROPONINI 0.44* 0.54* 0.28*   ------------------------------------------------------------------------------------------------------------------    Component Value Date/Time   BNP 50.7 06/17/2018 1254     Shon Hale M.D on 06/20/2018 at 6:07 PM   Go to [email or web address] - password [email or web address] for contact info  [facility] - Office  [phone]

## 2018-06-20 NOTE — Plan of Care (Signed)
  Problem: Health Behavior/Discharge Planning: Goal: Ability to manage health-related needs will improve Outcome: Progressing   Problem: Nutrition: Goal: Adequate nutrition will be maintained Outcome: Progressing   

## 2018-06-20 NOTE — Progress Notes (Signed)
Tried to weigh patient using standing scale, but patient wouldn't not stand long enough to get an accurate weight. Linens changed, patient bathed.

## 2018-06-20 NOTE — Consult Note (Addendum)
[facility] Face-to-Face Psychiatry Consult   Reason for Consult:  Bipolar disorder, severe anhedonia  Referring Physician:  Dr. Mariea Clonts  Patient Identification: Christian [name] MRN:  [id number] Principal Diagnosis: Bipolar I disorder, most recent episode depressed (HCC) Diagnosis:   Patient Active Problem List   Diagnosis Date Noted  . Paroxysmal atrial fibrillation (HCC) [I48.0] 06/18/2018  . History of pulmonary embolism [Z86.711] 06/17/2018  . Generalized weakness [R53.1] 03/27/2018  . Diabetes mellitus type 2 in nonobese (HCC) [E11.9] 03/27/2018  . Pulmonary embolism (HCC) [I26.99] 03/27/2018  . Fall [W19.XXXA]   . Laceration of eyebrow [S01.119A]   . Bipolar I disorder, most recent episode depressed, severe w psychosis (HCC) [F31.5] 12/22/2015  . Bipolar affective disorder, depressed, severe, with psychotic behavior (HCC) [F31.5]   . Bipolar I disorder, current or most recent episode depressed, with psychotic features (HCC) [F31.5]   . Bipolar I disorder, most recent episode depressed (HCC) [F31.30]   . Severe bipolar I disorder with depression (HCC) [F31.4]   . Bipolar I disorder, most recent episode depressed, severe with psychotic features (HCC) [F31.5]   . Overdose [T50.901A] 09/11/2015  . Acute respiratory failure with hypoxia (HCC) [J96.01] 09/05/2015  . Elevated troponin [R74.8] 09/05/2015  . Somnolence [R40.0] 09/05/2015  . Bipolar depression (HCC) [F31.9] 09/05/2015  . Acute bilateral deep vein thrombosis (DVT) of femoral veins (HCC) [I82.413] 09/05/2015  . Bilateral pulmonary embolism (HCC) [I26.99] 09/02/2015  . Labile hypertension [R09.89]   . Pulmonary embolism with acute cor pulmonale (HCC) [I26.09]   . Dyspnea [R06.00]   . Orthostatic hypotension [I95.1]   . Bipolar disorder, in partial remission, most recent episode depressed (HCC) [F31.75]   . Syncope, near [R55] 08/31/2015  . Affective psychosis, bipolar (HCC) [F31.9]   . Bipolar disorder, now depressed (HCC)  [F31.30] 08/28/2015  . Bipolar I disorder, severe, current or most recent episode depressed, with psychotic features (HCC) [F31.5]   . Pressure ulcer [L89.90] 07/30/2015  . Protein-calorie malnutrition, severe (HCC) [E43] 07/30/2015  . Dehydration [E86.0] 07/29/2015  . Bipolar disorder, current episode depressed, severe, with psychotic features (HCC) [F31.5] 07/09/2015  . Diabetes (HCC) [E11.9] 07/09/2015  . UTI (urinary tract infection) [N39.0] 06/19/2014  . Positive blood culture [R78.81] 06/19/2014  . Bipolar affective disorder, current episode manic with psychotic symptoms (HCC) [F31.2] 06/17/2014  . Type 2 diabetes mellitus with complication, without long-term current use of insulin (HCC) [E11.8] 06/02/2014  . Other and unspecified hyperlipidemia [E78.5] 06/02/2014  . 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 Gout [M10.9] 06/02/2014  . Hypertension [I10]     Total Time spent with patient: 1 hour  Subjective:   Christian [name] is a 67 y.o. male patient admitted with DVT/PE in setting of noncompliance.  HPI:   Per chart review, patient was admitted with DVT/PE in setting of noncompliance with Xarelto. He has a history of bipolar disorder. He has completed ECT in the past. Primary team has concern that his mood disorder may be impacting his ability to be compliant with medical therapy. Home medications include Abilify 10 mg daily, Depakote 1000 mg qhs and Prozac 20 mg daily. He is not prescribed Abilify in the hospital. He is recommended for SNF placement.   On interview, Christian [name] reports a depressed mood for the past 6 months without identifiable stressors.  He reports discontinuing Prozac several months ago after running out.  He reports a decline in his mood prior to discontinuing Prozac.  He reports compliance with Depakote.  He does not feel that anything has helped with his mood in the past.  He completed ECT several years ago.  He  does not believe that he has done TMS.  He denies current SI, HI or AVH.  He denies problems with sleep or appetite.  He endorses anhedonia and does not recall the activities that he previously enjoyed.  He endorses a history of manic symptoms although his last episode was several years ago.  Past Psychiatric History: BPAD type 1  Risk to Self:  None. Denies SI.  Risk to Others:  None. Denies HI.  Prior Inpatient Therapy:  He was hospitalized (12/22/2015-02/03/2016) for depression with psychotic features then transferred to [facility] for further care.  Prior Outpatient Therapy:  Prior medication include Lithium (caused increased creatinine level and tremors), Klonopin 1 mg qhs, Remeron 45 mg qhs, Seroquel, Zyprexa 30 mg qhs and Abilify. He was previously followed by Dr. Emerson Monte.   Past Medical History:  Past Medical History:  Diagnosis Date  . 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 Bipolar 1 disorder (HCC)   . Bipolar disorder (HCC)   . Chronic kidney disease   . Gout   . Hx of blood clots   . Hypertension   . Mood swings   . OSA on CPAP 06/02/2014  . Other and unspecified hyperlipidemia 06/02/2014  . Type II or unspecified type diabetes mellitus with unspecified complication, uncontrolled 06/02/2014   History reviewed. No pertinent surgical history. Family History:  Family History  Problem Relation Age of Onset  . Dementia Mother   . Arthritis Father   . Hypertension Father   . Mental illness Sister   . Diabetes Sister   . Heart failure Neg Hx   . Kidney failure Neg Hx   . Cancer Neg Hx    Family Psychiatric  History: Sister-bipolar disorder.  Social History:  Social History   Substance and Sexual Activity  Alcohol Use No     Social History   Substance and Sexual Activity  Drug Use No    Social History   Socioeconomic History  . Marital status: Divorced    Spouse name: Not on file  . Number of children: Not on file  . Years of  education: Not on file  . Highest education level: Not on file  Occupational History  . Not on file  Social Needs  . Financial resource strain: Not on file  . Food insecurity:    Worry: Not on file    Inability: Not on file  . Transportation needs:    Medical: Not on file    Non-medical: Not on file  Tobacco Use  . Smoking status: Never Smoker  . Smokeless tobacco: Never Used  Substance and Sexual Activity  . Alcohol use: No  . Drug use: No  . Sexual activity: Not Currently  Lifestyle  . Physical activity:    Days per week: Not on file    Minutes per session: Not on file  . Stress: Not on file  Relationships  . Social connections:    Talks on phone: Not on file    Gets together: Not on file    Attends religious service: Not on file    Active member of club or organization: Not on file    Attends meetings of clubs or organizations: Not on file    Relationship status: Not on file  Other Topics Concern  . Not on file  Social History Narrative   *  The patient has been married for over 30 years and is currently on disability. He lives with his wife in the [place] area and has one daughter.             Additional Social History: He lives at home with his wife. He has a 60 y/o daughter. He is a retired Psychologist, forensic. He denies alcohol or illicit substance use.     Allergies:   Allergies  Allergen Reactions  . Zyprexa [Olanzapine] Other (See Comments)    Allergy noted by ACT Team. No reaction specified.    Labs:  Results for orders placed or performed during the hospital encounter of 06/17/18 (from the past 48 hour(s))  Glucose, capillary     Status: Abnormal   Collection Time: 06/19/18 12:31 PM  Result Value Ref Range   Glucose-Capillary 157 (H) 70 - 99 mg/dL  Glucose, capillary     Status: Abnormal   Collection Time: 06/19/18  4:29 PM  Result Value Ref Range   Glucose-Capillary 146 (H) 70 - 99 mg/dL   Comment 1 Notify RN   Glucose, capillary     Status:  Abnormal   Collection Time: 06/19/18  9:18 PM  Result Value Ref Range   Glucose-Capillary 106 (H) 70 - 99 mg/dL   Comment 1 Notify RN    Comment 2 Document in Chart   Glucose, capillary     Status: Abnormal   Collection Time: 06/20/18  8:13 AM  Result Value Ref Range   Glucose-Capillary 112 (H) 70 - 99 mg/dL  Glucose, capillary     Status: Abnormal   Collection Time: 06/20/18 11:55 AM  Result Value Ref Range   Glucose-Capillary 196 (H) 70 - 99 mg/dL    Current Facility-Administered Medications  Medication Dose Route Frequency Provider Last Rate Last Dose  . acetaminophen (TYLENOL) tablet 650 mg  650 mg Oral Q6H PRN Lahoma Crocker, MD       Or  . acetaminophen (TYLENOL) suppository 650 mg  650 mg Rectal Q6H PRN Lahoma Crocker, MD      . amLODipine (NORVASC) tablet 10 mg  10 mg Oral Daily Lahoma Crocker, MD   10 mg at 06/20/18 0953  . cloNIDine (CATAPRES) tablet 0.2 mg  0.2 mg Oral BID Lahoma Crocker, MD   0.2 mg at 06/20/18 0953  . divalproex (DEPAKOTE) DR tablet 1,000 mg  1,000 mg Oral QHS Lahoma Crocker, MD   1,000 mg at 06/19/18 2130  . docusate sodium (COLACE) capsule 100 mg  100 mg Oral BID Lahoma Crocker, MD   100 mg at 06/20/18 0953  . FLUoxetine (PROZAC) capsule 20 mg  20 mg Oral Daily Lahoma Crocker, MD   20 mg at 06/20/18 0953  . hydrochlorothiazide (MICROZIDE) capsule 12.5 mg  12.5 mg Oral Daily Emokpae, Courage, MD   12.5 mg at 06/20/18 0953  . insulin aspart (novoLOG) injection 0-9 Units  0-9 Units Subcutaneous TID WC Shon Hale, MD   1 Units at 06/19/18 1745  . latanoprost (XALATAN) 0.005 % ophthalmic solution 1 drop  1 drop Right Eye QHS Shon Hale, MD   1 drop at 06/19/18 2128  . metoprolol succinate (TOPROL-XL) 24 hr tablet 100 mg  100 mg Oral BID Lahoma Crocker, MD   100 mg at 06/20/18 0953  . ondansetron (ZOFRAN) tablet 4 mg  4 mg Oral Q6H PRN Lahoma Crocker, MD       Or  . ondansetron [facility])  injection 4 mg  4 mg  Intravenous Q6H PRN Lahoma Crocker, MD      . oxyCODONE (Oxy IR/ROXICODONE) immediate release tablet 5 mg  5 mg Oral Q4H PRN Lahoma Crocker, MD      . rivaroxaban Carlena Hurl) tablet 20 mg  20 mg Oral Q supper Shon Hale, MD   20 mg at 06/19/18 1849  . senna (SENOKOT) tablet 8.6 mg  1 tablet Oral BID Lahoma Crocker, MD   8.6 mg at 06/20/18 0953  . sodium chloride flush (NS) 0.9 % injection 3 mL  3 mL Intravenous Q12H Lahoma Crocker, MD   3 mL at 06/20/18 2841    Musculoskeletal: Strength & Muscle Tone: within normal limits Gait & Station: UTA since patient is sitting in a chair.  Patient leans: N/A  Psychiatric Specialty Exam: Physical Exam  Nursing note and vitals reviewed. Constitutional: He is oriented to person, place, and time. He appears well-developed and well-nourished.  HENT:  Head: Normocephalic and atraumatic.  Respiratory: Effort normal.  Musculoskeletal: Normal range of motion.  Neurological: He is alert and oriented to person, place, and time.  Skin: No rash noted.  Psychiatric: Judgment and thought content normal. His speech is delayed. He is slowed and withdrawn. Cognition and memory are normal. He exhibits a depressed mood.    Review of Systems  Constitutional: Negative for chills and fever.  Cardiovascular: Negative for chest pain.  Gastrointestinal: Negative for abdominal pain, constipation, diarrhea, nausea and vomiting.  Psychiatric/Behavioral: Positive for depression. Negative for hallucinations, substance abuse and suicidal ideas. The patient does not have insomnia.   All other systems reviewed and are negative.   Blood pressure 122/76, pulse 66, temperature 98.2 F (36.8 C), temperature source Oral, resp. rate (!) 21, height 6' (1.829 m), weight 103.7 kg (228 lb 9.9 oz), SpO2 95 %.Body mass index is 31.01 kg/m.  General Appearance: Fairly Groomed, middle aged, African American male, wearing a hospital gown with a full beard and sitting in a  chair. NAD.   Eye Contact:  Fair  Speech:  Clear and Coherent and Slow  Volume:  Decreased  Mood:  Depressed  Affect:  Depressed  Thought Process:  Goal Directed, Linear and Descriptions of Associations: Intact  Orientation:  Full (Time, Place, and Person)  Thought Content:  Logical  Suicidal Thoughts:  No  Homicidal Thoughts:  No  Memory:  Immediate;   Good Recent;   Good Remote;   Good  Judgement:  Fair  Insight:  Fair  Psychomotor Activity:  Psychomotor Retardation  Concentration:  Concentration: Good and Attention Span: Good  Recall:  Good  Fund of Knowledge:  Good  Language:  Good  Akathisia:  No  Handed:  Right  AIMS (if indicated):   N/A  Assets:  Communication Skills Housing Intimacy Social Support  ADL's:  Intact  Cognition:  WNL  Sleep:   Okay   Assessment: Christian [name] is a 67 y.o. male who was admitted with DVT/PE in the setting of noncompliance with Xarelto. He reports depressed mood for the past 6 months with anhedonia. He denies SI, HI or AVH. Recommend Wellbutrin to improve mood. He does not warrant inpatient psychiatric hospitalization at this time due to no imminent risk of harm to self or others.   Treatment Plan Summary: -Recommend starting Wellbutrin XL 150 mg daily for depression. -Continue Depakote 1000 mg qhs for mood stabilization.  -Discontinue Prozac since patient reports he has not taking it for several months.  He stopped Prozac prior to mood decline.  -Patient should continue to follow up with his outpatient provider for further medication management.  -Patient is psychiatrically cleared. Psychiatry will sign off on patient at this time. Please consult psychiatry again as needed.   Disposition: No evidence of imminent risk to self or others at present.   Patient does not meet criteria for psychiatric inpatient admission.  Cherly Beach, DO 06/20/2018 1:11 PM

## 2018-06-21 DIAGNOSIS — F3130 Bipolar disorder, current episode depressed, mild or moderate severity, unspecified: Secondary | ICD-10-CM | POA: Diagnosis not present

## 2018-06-21 LAB — GLUCOSE, CAPILLARY
Glucose-Capillary: 91 mg/dL (ref 70–99)
Glucose-Capillary: 154 mg/dL — ABNORMAL HIGH (ref 70–99)
Glucose-Capillary: 184 mg/dL — ABNORMAL HIGH (ref 70–99)
Glucose-Capillary: 118 mg/dL — ABNORMAL HIGH (ref 70–99)

## 2018-06-21 NOTE — Progress Notes (Signed)
Received call from Pricilla Handler  ( [phone]) with the ACT team. They are follow patient for psych issues; office # [phone]; Abelino Derrick [facility] [phone]

## 2018-06-21 NOTE — Progress Notes (Signed)
Bupropion requested from pharmacy .

## 2018-06-21 NOTE — Progress Notes (Signed)
Occupational Therapy Treatment Patient Details Name: Christian [name] MRN: [id number] DOB: [birth year] Today's Date: 06/21/2018    History of present illness 67 y.o. male presenting to ED for weakness and increased urine output with foul odor with elevated troponin. PMHx: DM, CKD, bipolar, HTN   OT comments  Pt continues to present with flat affect and decreased engagement. Pt requiring Max encouragement to participate in OOB activity. Pt benefits from simple, direct cues to increase participation and engagement in ADLs and mobility. Pt requiring Min-Mod A for functional mobility. Pt performing UB dressing and two grooming tasks while seated in recliner with cues for initiating each task. Continue to recommend post-acute rehab and will continue to follow acutely as admitted.    Follow Up Recommendations  SNF    Equipment Recommendations  None recommended by OT    Recommendations for Other Services PT consult    Precautions / Restrictions Precautions Precautions: Fall Restrictions Weight Bearing Restrictions: No       Mobility Bed Mobility Overal bed mobility: Needs Assistance Bed Mobility: Supine to Sit     Supine to sit: Min assist     General bed mobility comments: Max tactile cues to bring legs towards EOB. Pt able to perform movements but requiring increased physical A to initate tasks due to self limiting behavior  Transfers Overall transfer level: Needs assistance   Transfers: Sit to/from Stand;Stand Pivot Transfers Sit to Stand: Mod assist Stand pivot transfers: Min assist       General transfer comment: Mod A to power up into standing due to self limiting behavior. Min A for safety during transfer to recliner    Balance Overall balance assessment: Needs assistance Sitting-balance support: No upper extremity supported;Feet supported Sitting balance-Leahy Scale: Fair     Standing balance support: Bilateral upper extremity supported Standing balance-Leahy  Scale: Poor Standing balance comment: RW with bil UE support in standing                           ADL either performed or assessed with clinical judgement   ADL Overall ADL's : Needs assistance/impaired     Grooming: Set up;Supervision/safety;Sitting;Oral care;Wash/dry face Grooming Details (indicate cue type and reason): Pt performing oral care and washing his face while seated in recliner. Pt required cues for initating task and increased time due to decreased engagment.          Upper Body Dressing : Minimal assistance;Sitting Upper Body Dressing Details (indicate cue type and reason): Pt donning new gown with Min A.      Toilet Transfer: Moderate assistance;RW;Stand-pivot(Simulated to recliner) Toilet Transfer Details (indicate cue type and reason): Mod A for power up into standing from EOB. Pt with decreased engagment         Functional mobility during ADLs: Min guard;Rolling walker General ADL Comments: Pt engaging in UB dressing and two grooming tasks while seated.      Vision       Perception     Praxis      Cognition Arousal/Alertness: Awake/alert Behavior During Therapy: Flat affect Overall Cognitive Status: No family/caregiver present to determine baseline cognitive functioning                                 General Comments: Pt contues to present with depressive episode stating "I don't want this" during bed mobility. Pt benefits from increased encouragement and simple cues.  Exercises     Shoulder Instructions       General Comments      Pertinent Vitals/ Pain       Pain Assessment: No/denies pain  Home Living                                          Prior Functioning/Environment              Frequency  Min 2X/week        Progress Toward Goals  OT Goals(current goals can now be found in the care plan section)  Progress towards OT goals: Progressing toward goals  Acute Rehab  OT Goals Patient Stated Goal: did not state OT Goal Formulation: With patient Time For Goal Achievement: 07/03/18 Potential to Achieve Goals: Good ADL Goals Pt Will Perform Grooming: standing;with set-up;with supervision Pt Will Perform Lower Body Dressing: with set-up;with supervision;sit to/from stand Pt Will Transfer to Toilet: with set-up;with supervision;ambulating;bedside commode Pt Will Perform Toileting - Clothing Manipulation and hygiene: with set-up;with supervision;sit to/from stand  Plan Discharge plan remains appropriate    Co-evaluation                 AM-PAC PT "6 Clicks" Daily Activity     Outcome Measure   Help from another person eating meals?: None Help from another person taking care of personal grooming?: A Little Help from another person toileting, which includes using toliet, bedpan, or urinal?: A Little Help from another person bathing (including washing, rinsing, drying)?: A Lot Help from another person to put on and taking off regular upper body clothing?: A Little Help from another person to put on and taking off regular lower body clothing?: A Lot 6 Click Score: 17    End of Session Equipment Utilized During Treatment: Rolling walker  OT Visit Diagnosis: Unsteadiness on feet (R26.81);Other abnormalities of gait and mobility (R26.89);Muscle weakness (generalized) (M62.81)   Activity Tolerance Patient tolerated treatment well   Patient Left in chair;with call bell/phone within reach;with chair alarm set;with nursing/sitter in room   Nurse Communication Mobility status        Time: 1224-4975 OT Time Calculation (min): 24 min  Charges: OT General Charges $OT Visit: 1 Visit OT Treatments $Self Care/Home Management : 23-37 mins  Tayquan Gassman MSOT, OTR/L Acute Rehab Pager: [phone] Office: [phone]   Theodoro Grist Abid Bolla 06/21/2018, 3:58 PM

## 2018-06-21 NOTE — Progress Notes (Signed)
RN has been trying to get patient to void- patient states he has to, but doesn't want to; now patient has had an episode of incontinence will re scan and place condom catheter.

## 2018-06-21 NOTE — Clinical Social Work Note (Addendum)
CSW uploaded requested documents into [place] [employer] for PASARR review. Insurance authorization still pending but they confirmed they received all the documents they need.  Charlynn Court, CSW [phone]  10:31 am PASARR under level II review.  Charlynn Court, CSW [phone]

## 2018-06-21 NOTE — Progress Notes (Signed)
PROGRESS NOTE  Christian [name] ZOX:[id number] DOB: Nov 13, 1951 DOA: 06/17/2018 PCP: [name], MD   LOS: 0 days   Brief Narrative / Interim history: 67 year old male with diabetes, chronic kidney disease, hypertension, bipolar disease, bilateral PE with noncompliance with anticoagulation he was admitted on 7/20 with multiple complaints, noncompliant at home, and now awaiting SNF placement  Assessment & Plan: Principal Problem:   Bipolar I disorder, most recent episode depressed (HCC) Active Problems:   Hypertension   Type 2 diabetes mellitus with complication, without long-term current use of insulin (HCC)   Bilateral lower extremity edema: chronic with venous stasis changes   Bipolar affective disorder, current episode manic with psychotic symptoms (HCC)   Protein-calorie malnutrition, severe (HCC)   Bipolar disorder, in partial remission, most recent episode depressed (HCC)   Elevated troponin   History of pulmonary embolism   Paroxysmal atrial fibrillation (HCC)   History of DVT/PE -continue Xarelto, he is noncompliant at home  Type 2 diabetes mellitus -Continue sliding scale  Hypertension -Stable, continue current regimen with Norvasc, clonidine, metoprolol, HCTZ  Bipolar Disorder -patient with history of severe depression in the past requiring ECT therapy , mood disorder is most likely impacting patient's ability to be compliant with medical therapy, denies suicidal homicidal ideation or plan at this time, affect is flat patient is kind of quiet, patient with significant anhedonia , psychiatric consultation appreciated,  Dr Sharma Covert recommends stopping Prozac, starting Wellbutrin XL 150 mg daily, okay to continue Depakote 1000 mg... Patient's judgment and insight appears to be somewhat poor  History of Paroxysmal Atrial Fibrillation -Stable, continue metoprolol for rate control, currently in sinus, continue anticoagulation  Elevated Troponin  -patient is chest pain-free  at this time, EKG without ACS, echocardiogram with preserved EF of 55%, no regional wall motion abnormalities, continue metoprolol xl 100 mg bid,  and Xarelto, patient declines further chest imaging studies  Social/Ethics - patient gave permission to discuss his care with his wife and also with his daughter, plan of care discussed with patient's wife, questions answered.  patient's judgment and insight appears to be somewhat poor, psychiatric consultation appreciated mood disorder is most likely impacting patient's ability to be compliant with medical therapy  Generalized Weakness and Debility -SNF pending, d/w social sowrk today    DVT prophylaxis: Xarelto Code Status: Full code Family Communication: no family at bedside Disposition Plan: SNF when bed available  Consultants:   Psychiatry   Procedures:   2D echo:  Study Conclusions - Left ventricle: The cavity size was normal. There was mild concentric hypertrophy. Systolic function was normal. The estimated ejection fraction was in the range of 55% to 60%. Although no diagnostic regional wall motion abnormality was identified, this possibility cannot be completely excluded on the basis of this study. Doppler parameters are consistent with abnormal left ventricular relaxation (grade 1 diastolic dysfunction). - Aortic valve: Valve area (VTI): 2.53 cm^2. Valve area (Vmean):  2.1 cm^2. - Mitral valve: Calcified annulus.   Antimicrobials:  None    Subjective: -Flat affect, denies any complaints for me.  Objective: Vitals:   06/20/18 2243 06/21/18 0457 06/21/18 1200 06/21/18 1219  BP: (!) 155/91 (!) 149/92 (!) 161/90 (!) 178/99  Pulse: 61 (!) 57 68 64  Resp:  18  20  Temp:  (!) 97.5 F (36.4 C)  98.8 F (37.1 C)  TempSrc:  Oral  Oral  SpO2:  100%  100%  Weight:  103.2 kg (227 lb 8.2 oz)    Height:  Intake/Output Summary (Last 24 hours) at 06/21/2018 1724 Last data filed at 06/21/2018 1544 Gross per 24 hour  Intake  1320 ml  Output 1500 ml  Net -180 ml   Filed Weights   06/19/18 0521 06/20/18 0531 06/21/18 0457  Weight: 105.1 kg (231 lb 11.3 oz) 103.7 kg (228 lb 9.9 oz) 103.2 kg (227 lb 8.2 oz)    Examination:  Constitutional: NAD Eyes: PERRL, lids and conjunctivae normal ENMT: Mucous membranes are moist.  Neck: normal, supple Respiratory: clear to auscultation bilaterally, no wheezing, no crackles. Normal respiratory effort. No accessory muscle use.  Cardiovascular: Regular rate and rhythm, no murmurs / rubs / gallops. No LE edema.  Abdomen: no tenderness. Bowel sounds positive.  Skin: no rashes Neurologic: non focal  Psychiatric: Very flat affect   Data Reviewed: I have independently reviewed following labs and imaging studies   CBC: Recent Labs  Lab 06/17/18 1254 06/18/18 0333  WBC 4.5 4.9  NEUTROABS 2.7  --   HGB 15.9 13.2  HCT 49.7 40.2  MCV 90.0 88.2  PLT 174 137*   Basic Metabolic Panel: Recent Labs  Lab 06/17/18 1254 06/18/18 0333  NA 136 138  K 4.1 4.3  CL 95* 99  CO2 27 28  GLUCOSE 206* 146*  BUN 16 13  CREATININE 1.19 1.00  CALCIUM 9.6 8.9   GFR: Estimated Creatinine Clearance: 92.7 mL/min (by C-G formula based on SCr of 1 mg/dL). Liver Function Tests: Recent Labs  Lab 06/17/18 1254  AST 18  ALT 12  ALKPHOS 72  BILITOT 0.6  PROT 7.6  ALBUMIN 3.7   No results for input(s): LIPASE, AMYLASE in the last 168 hours. No results for input(s): AMMONIA in the last 168 hours. Coagulation Profile: No results for input(s): INR, PROTIME in the last 168 hours. Cardiac Enzymes: Recent Labs  Lab 06/17/18 1522 06/17/18 2150 06/18/18 0333 06/18/18 0929  CKTOTAL 152  --   --   --   TROPONINI  --  0.44* 0.54* 0.28*   BNP (last 3 results) No results for input(s): PROBNP in the last 8760 hours. HbA1C: No results for input(s): HGBA1C in the last 72 hours. CBG: Recent Labs  Lab 06/20/18 1617 06/20/18 2115 06/21/18 0731 06/21/18 1119 06/21/18 1632    GLUCAP 150* 177* 91 154* 184*   Lipid Profile: No results for input(s): CHOL, HDL, LDLCALC, TRIG, CHOLHDL, LDLDIRECT in the last 72 hours. Thyroid Function Tests: No results for input(s): TSH, T4TOTAL, FREET4, T3FREE, THYROIDAB in the last 72 hours. Anemia Panel: No results for input(s): VITAMINB12, FOLATE, FERRITIN, TIBC, IRON, RETICCTPCT in the last 72 hours. Urine analysis:    Component Value Date/Time   COLORURINE STRAW (A) 06/17/2018 1552   APPEARANCEUR CLEAR 06/17/2018 1552   APPEARANCEUR Cloudy 08/01/2014 1614   LABSPEC 1.006 06/17/2018 1552   LABSPEC 1.008 08/01/2014 1614   PHURINE 7.0 06/17/2018 1552   GLUCOSEU 50 (A) 06/17/2018 1552   GLUCOSEU Negative 08/01/2014 1614   HGBUR NEGATIVE 06/17/2018 1552   BILIRUBINUR NEGATIVE 06/17/2018 1552   BILIRUBINUR Negative 08/01/2014 1614   KETONESUR NEGATIVE 06/17/2018 1552   PROTEINUR NEGATIVE 06/17/2018 1552   UROBILINOGEN 0.2 07/01/2014 1809   NITRITE NEGATIVE 06/17/2018 1552   LEUKOCYTESUR NEGATIVE 06/17/2018 1552   LEUKOCYTESUR Negative 08/01/2014 1614   Sepsis Labs: Invalid input(s): PROCALCITONIN, LACTICIDVEN  Recent Results (from the past 240 hour(s))  Urine culture     Status: None   Collection Time: 06/17/18  3:52 PM  Result Value Ref Range Status  Specimen Description URINE, CLEAN CATCH  Final   Special Requests NONE  Final   Culture   Final    NO GROWTH Performed at [facility], [address], [place], [place] [address]    Report Status 06/18/2018 FINAL  Final      Radiology Studies: No results found.   Scheduled Meds: . amLODipine  10 mg Oral Daily  . buPROPion  150 mg Oral Daily  . cloNIDine  0.2 mg Oral BID  . divalproex  1,000 mg Oral QHS  . docusate sodium  100 mg Oral BID  . hydrochlorothiazide  12.5 mg Oral Daily  . insulin aspart  0-9 Units Subcutaneous TID WC  . latanoprost  1 drop Right Eye QHS  . metoprolol succinate  100 mg Oral BID  . rivaroxaban  20 mg Oral Q supper  .  senna  1 tablet Oral BID  . sodium chloride flush  3 mL Intravenous Q12H   Continuous Infusions:    Pamella Pert, MD, PhD [facility] Pager [phone]  If 7PM-7AM, please contact night-coverage [email or web address] Password [facility] 06/21/2018, 5:24 PM

## 2018-06-22 DIAGNOSIS — F3130 Bipolar disorder, current episode depressed, mild or moderate severity, unspecified: Secondary | ICD-10-CM | POA: Diagnosis not present

## 2018-06-22 LAB — GLUCOSE, CAPILLARY
Glucose-Capillary: 86 mg/dL (ref 70–99)
Glucose-Capillary: 172 mg/dL — ABNORMAL HIGH (ref 70–99)
Glucose-Capillary: 145 mg/dL — ABNORMAL HIGH (ref 70–99)
Glucose-Capillary: 106 mg/dL — ABNORMAL HIGH (ref 70–99)

## 2018-06-22 LAB — CBC
HCT: 43.1 % (ref 39.0–52.0)
Hemoglobin: 14 g/dL (ref 13.0–17.0)
MCH: 28.7 pg (ref 26.0–34.0)
MCHC: 32.5 g/dL (ref 30.0–36.0)
MCV: 88.5 fL (ref 78.0–100.0)
Platelets: 148 10*3/uL — ABNORMAL LOW (ref 150–400)
RBC: 4.87 MIL/uL (ref 4.22–5.81)
RDW: 12 % (ref 11.5–15.5)
WBC: 4.6 10*3/uL (ref 4.0–10.5)

## 2018-06-22 LAB — BASIC METABOLIC PANEL
Anion gap: 9 (ref 5–15)
BUN: 26 mg/dL — ABNORMAL HIGH (ref 8–23)
CO2: 29 mmol/L (ref 22–32)
Calcium: 9 mg/dL (ref 8.9–10.3)
Chloride: 98 mmol/L (ref 98–111)
Creatinine, Ser: 1.36 mg/dL — ABNORMAL HIGH (ref 0.61–1.24)
GFR calc Af Amer: >60 mL/min (ref >60)
GFR calc non Af Amer: 53 mL/min — ABNORMAL LOW (ref >60)
Glucose, Bld: 178 mg/dL — ABNORMAL HIGH (ref 70–99)
Potassium: 4.8 mmol/L (ref 3.5–5.1)
Sodium: 136 mmol/L (ref 135–145)

## 2018-06-22 NOTE — Plan of Care (Signed)
  Problem: Nutrition: Goal: Adequate nutrition will be maintained Outcome: Progressing   Problem: Pain Managment: Goal: General experience of comfort will improve Outcome: Progressing   Problem: Safety: Goal: Ability to remain free from injury will improve Outcome: Progressing   

## 2018-06-22 NOTE — Clinical Social Work Note (Addendum)
PASARR screener will come to hospital today to meet with patient for level II review.  Charlynn Court, CSW [phone]  8:50 am Authorization approved: [id number], rug level RVA. Patient can go to SNF once PASARR obtained.  Charlynn Court, CSW [phone]

## 2018-06-22 NOTE — Progress Notes (Signed)
PROGRESS NOTE  Christian [name] MBB:[id number] DOB: 1951/05/24 DOA: 06/17/2018 PCP: [name], MD   LOS: 0 days   Brief Narrative / Interim history: 67 year old male with diabetes, chronic kidney disease, hypertension, bipolar disease, bilateral PE with noncompliance with anticoagulation he was admitted on 7/20 with multiple complaints, noncompliant at home, and now awaiting SNF placement  Assessment & Plan: Principal Problem:   Bipolar I disorder, most recent episode depressed (HCC) Active Problems:   Hypertension   Type 2 diabetes mellitus with complication, without long-term current use of insulin (HCC)   Bilateral lower extremity edema: chronic with venous stasis changes   Bipolar affective disorder, current episode manic with psychotic symptoms (HCC)   Protein-calorie malnutrition, severe (HCC)   Bipolar disorder, in partial remission, most recent episode depressed (HCC)   Elevated troponin   History of pulmonary embolism   Paroxysmal atrial fibrillation (HCC)   History of DVT/PE -continue Xarelto, he is noncompliant at home  Type 2 diabetes mellitus -Continue sliding scale  Hypertension -Stable, continue current regimen with Norvasc, clonidine, metoprolol, HCTZ  Bipolar Disorder -patient with history of severe depression in the past requiring ECT therapy , mood disorder is most likely impacting patient's ability to be compliant with medical therapy, denies suicidal homicidal ideation or plan at this time, affect is flat patient is kind of quiet, patient with significant anhedonia , psychiatric consultation appreciated,  Dr Sharma Covert recommends stopping Prozac, starting Wellbutrin XL 150 mg daily, okay to continue Depakote 1000 mg... Patient's judgment and insight appears to be somewhat poor  History of Paroxysmal Atrial Fibrillation -Stable, continue metoprolol for rate control, currently in sinus, continue anticoagulation  Elevated Troponin  -patient is chest pain-free  at this time, EKG without ACS, echocardiogram with preserved EF of 55%, no regional wall motion abnormalities, continue metoprolol xl 100 mg bid,  and Xarelto, patient declines further chest imaging studies  Social/Ethics - patient gave permission to discuss his care with his wife and also with his daughter, plan of care discussed with patient's wife, questions answered.  patient's judgment and insight appears to be somewhat poor, psychiatric consultation appreciated mood disorder is most likely impacting patient's ability to be compliant with medical therapy  Generalized Weakness and Debility -SNF pending   DVT prophylaxis: Xarelto Code Status: Full code Family Communication: no family at bedside Disposition Plan: SNF when bed available  Consultants:   Psychiatry   Procedures:   2D echo:  Study Conclusions - Left ventricle: The cavity size was normal. There was mild concentric hypertrophy. Systolic function was normal. The estimated ejection fraction was in the range of 55% to 60%. Although no diagnostic regional wall motion abnormality was identified, this possibility cannot be completely excluded on the basis of this study. Doppler parameters are consistent with abnormal left ventricular relaxation (grade 1 diastolic dysfunction). - Aortic valve: Valve area (VTI): 2.53 cm^2. Valve area (Vmean):  2.1 cm^2. - Mitral valve: Calcified annulus.   Antimicrobials:  None    Subjective: -no complaints  Objective: Vitals:   06/21/18 1219 06/21/18 2022 06/22/18 0619 06/22/18 1011  BP: (!) 178/99 140/86 140/80 137/82  Pulse: 64 69 63 72  Resp: 20 16 18    Temp: 98.8 F (37.1 C) 98.3 F (36.8 C) 97.6 F (36.4 C)   TempSrc: Oral Oral Oral   SpO2: 100% 99% 99% 98%  Weight:   105 kg (231 lb 7.7 oz)   Height:        Intake/Output Summary (Last 24 hours) at 06/22/2018 1706  Last data filed at 06/22/2018 0917 Gross per 24 hour  Intake 483 ml  Output 700 ml  Net -217 ml   Filed  Weights   06/20/18 0531 06/21/18 0457 06/22/18 0619  Weight: 103.7 kg (228 lb 9.9 oz) 103.2 kg (227 lb 8.2 oz) 105 kg (231 lb 7.7 oz)    Examination:  Constitutional: NAD Respiratory: CTA biL Cardiovascular: RRR  Data Reviewed: I have independently reviewed following labs and imaging studies   CBC: Recent Labs  Lab 06/17/18 1254 06/18/18 0333 06/22/18 1115  WBC 4.5 4.9 4.6  NEUTROABS 2.7  --   --   HGB 15.9 13.2 14.0  HCT 49.7 40.2 43.1  MCV 90.0 88.2 88.5  PLT 174 137* 148*   Basic Metabolic Panel: Recent Labs  Lab 06/17/18 1254 06/18/18 0333 06/22/18 1115  NA 136 138 136  K 4.1 4.3 4.8  CL 95* 99 98  CO2 GLUCOSE 206* 146* 178*  BUN 16 13 26*  CREATININE 1.19 1.00 1.36*  CALCIUM 9.6 8.9 9.0   GFR: Estimated Creatinine Clearance: 68.8 mL/min (A) (by C-G formula based on SCr of 1.36 mg/dL (H)). Liver Function Tests: Recent Labs  Lab 06/17/18 1254  AST 18  ALT 12  ALKPHOS 72  BILITOT 0.6  PROT 7.6  ALBUMIN 3.7   No results for input(s): LIPASE, AMYLASE in the last 168 hours. No results for input(s): AMMONIA in the last 168 hours. Coagulation Profile: No results for input(s): INR, PROTIME in the last 168 hours. Cardiac Enzymes: Recent Labs  Lab 06/17/18 1522 06/17/18 2150 06/18/18 0333 06/18/18 0929  CKTOTAL 152  --   --   --   TROPONINI  --  0.44* 0.54* 0.28*   BNP (last 3 results) No results for input(s): PROBNP in the last 8760 hours. HbA1C: No results for input(s): HGBA1C in the last 72 hours. CBG: Recent Labs  Lab 06/21/18 1119 06/21/18 1632 06/21/18 2120 06/22/18 0744 06/22/18 1123  GLUCAP 154* 184* 118* 86 172*   Lipid Profile: No results for input(s): CHOL, HDL, LDLCALC, TRIG, CHOLHDL, LDLDIRECT in the last 72 hours. Thyroid Function Tests: No results for input(s): TSH, T4TOTAL, FREET4, T3FREE, THYROIDAB in the last 72 hours. Anemia Panel: No results for input(s): VITAMINB12, FOLATE, FERRITIN, TIBC, IRON, RETICCTPCT  in the last 72 hours. Urine analysis:    Component Value Date/Time   COLORURINE STRAW (A) 06/17/2018 1552   APPEARANCEUR CLEAR 06/17/2018 1552   APPEARANCEUR Cloudy 08/01/2014 1614   LABSPEC 1.006 06/17/2018 1552   LABSPEC 1.008 08/01/2014 1614   PHURINE 7.0 06/17/2018 1552   GLUCOSEU 50 (A) 06/17/2018 1552   GLUCOSEU Negative 08/01/2014 1614   HGBUR NEGATIVE 06/17/2018 1552   BILIRUBINUR NEGATIVE 06/17/2018 1552   BILIRUBINUR Negative 08/01/2014 1614   KETONESUR NEGATIVE 06/17/2018 1552   PROTEINUR NEGATIVE 06/17/2018 1552   UROBILINOGEN 0.2 07/01/2014 1809   NITRITE NEGATIVE 06/17/2018 1552   LEUKOCYTESUR NEGATIVE 06/17/2018 1552   LEUKOCYTESUR Negative 08/01/2014 1614   Sepsis Labs: Invalid input(s): PROCALCITONIN, LACTICIDVEN  Recent Results (from the past 240 hour(s))  Urine culture     Status: None   Collection Time: 06/17/18  3:52 PM  Result Value Ref Range Status   Specimen Description URINE, CLEAN CATCH  Final   Special Requests NONE  Final   Culture   Final    NO GROWTH Performed at [facility], [address] [place], [place] [address]    Report Status 06/18/2018 FINAL  Final  Radiology Studies: No results found.   Scheduled Meds: . amLODipine  10 mg Oral Daily  . buPROPion  150 mg Oral Daily  . cloNIDine  0.2 mg Oral BID  . divalproex  1,000 mg Oral QHS  . docusate sodium  100 mg Oral BID  . hydrochlorothiazide  12.5 mg Oral Daily  . insulin aspart  0-9 Units Subcutaneous TID WC  . latanoprost  1 drop Right Eye QHS  . metoprolol succinate  100 mg Oral BID  . rivaroxaban  20 mg Oral Q supper  . senna  1 tablet Oral BID  . sodium chloride flush  3 mL Intravenous Q12H   Continuous Infusions:    Pamella Pert, MD, PhD [facility] Pager [phone]  If 7PM-7AM, please contact night-coverage [email or web address] Password [facility] 06/22/2018, 5:06 PM

## 2018-06-23 DIAGNOSIS — F3130 Bipolar disorder, current episode depressed, mild or moderate severity, unspecified: Secondary | ICD-10-CM | POA: Diagnosis not present

## 2018-06-23 LAB — GLUCOSE, CAPILLARY
Glucose-Capillary: 85 mg/dL (ref 70–99)
Glucose-Capillary: 170 mg/dL — ABNORMAL HIGH (ref 70–99)

## 2018-06-23 MED ORDER — RIVAROXABAN 20 MG PO TABS: 20.0000 mg | ORAL_TABLET | Freq: Every day | ORAL | 1 refills | Status: DC

## 2018-06-23 MED ORDER — BUPROPION HCL ER (XL) 150 MG PO TB24: 150.0000 mg | ORAL_TABLET | Freq: Every day | ORAL | 0 refills | Status: DC

## 2018-06-23 MED ORDER — ACETAMINOPHEN 325 MG PO TABS: 650.0000 mg | ORAL_TABLET | Freq: Four times a day (QID) | ORAL | Status: DC | PRN

## 2018-06-23 MED ORDER — HYDROCHLOROTHIAZIDE 12.5 MG PO CAPS: 12.5000 mg | ORAL_CAPSULE | Freq: Every day | ORAL | 0 refills | Status: DC

## 2018-06-23 MED ORDER — INSULIN ASPART 100 UNIT/ML ~~LOC~~ SOLN: 0.0000 [IU] | Freq: Three times a day (TID) | SUBCUTANEOUS | 11 refills | Status: DC

## 2018-06-23 MED ORDER — DOCUSATE SODIUM 100 MG PO CAPS: 100.0000 mg | ORAL_CAPSULE | Freq: Two times a day (BID) | ORAL | 0 refills | Status: DC

## 2018-06-23 NOTE — Clinical Social Work Note (Addendum)
PASARR obtained: [id number] F. Expires 07/23/2018. SNF aware and can take him today. CSW left message for patient's daughter to notify.  Charlynn Court, CSW [phone]  12:23 pm CSW went to tell patient that transport was about to be arranged and patient said "I can't go." He would not give CSW a reason, just saying "I can't go." Discussed concerns with safety with being home alone and patient continued to refuse. RN spoke with him and he told her he did not feel safe going home but did not want to go to SNF. Patient told her he wanted to stay here in the hospital. CSW notified patient's daughter of his refusal. She called and they had a discussion about safety at home as well. Patient was eventually agreeable.  Charlynn Court, CSW [phone]

## 2018-06-23 NOTE — Clinical Social Work Placement (Signed)
   CLINICAL SOCIAL WORK PLACEMENT  NOTE  Date:  06/23/2018  Patient Details  Name: Christian [name] MRN: [id number] Date of Birth: 25-Dec-1950  Clinical Social Work is seeking post-discharge placement for this patient at the Skilled  Nursing Facility level of care (*CSW will initial, date and re-position this form in  chart as items are completed):  Yes   Patient/family provided with [facility] Clinical Social Work Department's list of facilities offering this level of care within the geographic area requested by the patient (or if unable, by the patient's family).  Yes   Patient/family informed of their freedom to choose among providers that offer the needed level of care, that participate in Medicare, Medicaid or managed care program needed by the patient, have an available bed and are willing to accept the patient.  Yes   Patient/family informed of [facility]'s ownership interest in [facility] and [facility], as well as of the fact that they are under no obligation to receive care at these facilities.  PASRR submitted to EDS on 06/20/18     PASRR number received on 06/23/18     Existing PASRR number confirmed on       FL2 transmitted to all facilities in geographic area requested by pt/family on 06/20/18     FL2 transmitted to all facilities within larger geographic area on       Patient informed that his/her managed care company has contracts with or will negotiate with certain facilities, including the following:        Yes   Patient/family informed of bed offers received.  Patient chooses bed at [facility]     Physician recommends and patient chooses bed at      Patient to be transferred to [facility] on 06/23/18.  Patient to be transferred to facility by PTAR     Patient family notified on 06/23/18 of transfer.  Name of family member notified:  [name]     PHYSICIAN Please prepare prescriptions     Additional Comment:     _______________________________________________ Margarito Liner, LCSW 06/23/2018, 12:27 PM

## 2018-06-23 NOTE — Discharge Summary (Addendum)
Physician Discharge Summary  Christian [name] WUJ:[id number] DOB: 02-27-1951 DOA: 06/17/2018  PCP: [name], MD  Admit date: 06/17/2018 Discharge date: 06/23/2018  Admitted From: home Disposition: snf Recommendations for Outpatient Follow-up:  1. Follow up with PCP in 1-2 weeks 2. Please obtain BMP/CBC 06/26/2018 to follow-up renal functions. 3. Will need outpatient psychiatric follow-up  Home Health none Equipment/Devices none Discharge Condition stable CODE STATUS full Diet recommendation cardiac Brief/Interim Summary: 67 y.o. male with medical history significant of diabetes, chronic kidney disease, hypertension,  bipolar disease and bilateral pulmonary emboli currently anticoagulated.  He is cared for by his ex-wife, his daughter and his sister.  Recently discharged in May and went to skilled nursing facility.  Since then he has been at home with the care of these 3 people.  He is inactive and spends much of his time in a recliner.  He has been getting up out of the recliner to get to the table.  His ex-wife has been bringing him food which he has been eating.  Recently he started complaining of burning with urination and has a history of UTI and hospitalization.  Since come prepackaged and a blister pack and he gets them regularly.  Has had significant amounts of urine output with constant urination and his Lasix dose was recently decreased.  He takes hydrochlorothiazide as well.  And is able to speak and answer some questions.  His answers are quite slow and he is somewhat motor delayed as well.  He denies chest pain, shortness of breath, lower extremity pain he has recall of prior events in his medical history and he does not endorse any significant cardiac symptoms.  He was found to have an elevated troponin on evaluation and I was asked to further evaluate and manage him.  Due to elevated troponin Dr. Clarice Pole has ordered a CTA of the chest.  Patient had a bladder scan performed which  revealed 800 mL of residual urine in his bladder and therefore a Foley catheter was placed.     Discharge Diagnoses:  Principal Problem:   Bipolar I disorder, most recent episode depressed (HCC) Active Problems:   Hypertension   Type 2 diabetes mellitus with complication, without long-term current use of insulin (HCC)   Bilateral lower extremity edema: chronic with venous stasis changes   Bipolar affective disorder, current episode manic with psychotic symptoms (HCC)   Protein-calorie malnutrition, severe (HCC)   Bipolar disorder, in partial remission, most recent episode depressed (HCC)   Elevated troponin   History of pulmonary embolism   Paroxysmal atrial fibrillation (HCC)   istory of DVT/PE -continue Xarelto, he is noncompliant at home  Type 2 diabetes mellitus -Continue sliding scale.  Patient was taking metformin at home I will not restart the metformin at this time due to increased creatinine.  Hypertension -Stable, continue current regimen with Norvasc, clonidine, metoprolol, HCTZ  Bipolar Disorder -patient with history of severe depression in the past requiring ECT therapy , mood disorder is most likely impacting patient's ability to be compliant with medical therapy, denies suicidal homicidal ideation or plan at this time, affect is flat patient is kind of quiet, patient with significant anhedonia ,psychiatric consultation appreciated,Dr Normanrecommends stoppingProzac,starting Wellbutrin XL 150 mg daily, okay to continueDepakote1000 mg... Patient's judgment and insight appears to be somewhat poor  History of Paroxysmal Atrial Fibrillation -Stable, continue metoprolol for rate control, currently in sinus, continue anticoagulation  Elevated Troponin  -patient is chest pain-free at this time, EKG without ACS, echocardiogram with preserved  EF of 55%, no regional wall motion abnormalities, continue metoprolol xl 100 mg bid, and Xarelto, patient declines  further chest imaging studies  Urinary retention patient had Foley catheter placed at the time of admission due to residual of more than 800 cc in the bladder.  Continue Foley care and follow-up with [facility] urology as needed.    Discharge Instructions  Discharge Instructions    Call MD for:  difficulty breathing, headache or visual disturbances   Complete by:  As directed    Call MD for:  extreme fatigue   Complete by:  As directed    Call MD for:  persistant dizziness or light-headedness   Complete by:  As directed    Call MD for:  persistant nausea and vomiting   Complete by:  As directed    Call MD for:  severe uncontrolled pain   Complete by:  As directed    Diet - low sodium heart healthy   Complete by:  As directed    Increase activity slowly   Complete by:  As directed      Allergies as of 06/23/2018      Reactions   Zyprexa [olanzapine] Other (See Comments)   Allergy noted by ACT Team. No reaction specified.      Medication List    STOP taking these medications   ARIPiprazole 10 MG tablet Commonly known as:  ABILIFY   FLUoxetine 20 MG capsule Commonly known as:  PROZAC   furosemide 20 MG tablet Commonly known as:  LASIX   hydrochlorothiazide 25 MG tablet Commonly known as:  HYDRODIURIL Replaced by:  hydrochlorothiazide 12.5 MG capsule   metFORMIN 500 MG 24 hr tablet Commonly known as:  GLUCOPHAGE-XR   protein supplement shake Liqd Commonly known as:  PREMIER PROTEIN   senna 8.6 MG Tabs tablet Commonly known as:  SENOKOT     TAKE these medications   acetaminophen 325 MG tablet Commonly known as:  TYLENOL Take 2 tablets (650 mg total) by mouth every 6 (six) hours as needed for mild pain (or Fever >/= 101).   amLODipine 10 MG tablet Commonly known as:  NORVASC Take 10 mg by mouth every morning.   atorvastatin 10 MG tablet Commonly known as:  LIPITOR Take 10 mg by mouth every morning.   buPROPion 150 MG 24 hr tablet Commonly known as:   WELLBUTRIN XL Take 1 tablet (150 mg total) by mouth daily. Start taking on:  06/24/2018   cloNIDine 0.2 MG tablet Commonly known as:  CATAPRES Take 0.2 mg by mouth 2 (two) times daily.   divalproex 500 MG DR tablet Commonly known as:  DEPAKOTE Take 1,000 mg by mouth at bedtime.   docusate sodium 100 MG capsule Commonly known as:  COLACE Take 1 capsule (100 mg total) by mouth 2 (two) times daily.   hydrochlorothiazide 12.5 MG capsule Commonly known as:  MICROZIDE Take 1 capsule (12.5 mg total) by mouth daily. Start taking on:  06/24/2018 Replaces:  hydrochlorothiazide 25 MG tablet   insulin aspart 100 UNIT/ML injection Commonly known as:  novoLOG Inject 0-9 Units into the skin 3 (three) times daily with meals.   latanoprost 0.005 % ophthalmic solution Commonly known as:  XALATAN Place 1 drop into the right eye at bedtime.   metoprolol succinate 100 MG 24 hr tablet Commonly known as:  TOPROL-XL Take 100 mg by mouth 2 (two) times daily. Take with or immediately following a meal.   rivaroxaban 10 MG Tabs tablet Commonly known  as:  XARELTO Take 10 mg by mouth every evening.   Vitamin D3 1000 units Chew Chew 1,000 Units by mouth daily.       Contact information for follow-up providers    [name], MD Follow up.   Specialty:  Family Medicine Contact information: [phone] [address] [place] [address] [phone]            Contact information for after-discharge care    Destination    [facility] .   Service:  Skilled Nursing Contact information: [address] [place] [address] [phone]                 Allergies  Allergen Reactions  . Zyprexa [Olanzapine] Other (See Comments)    Allergy noted by ACT Team. No reaction specified.    Consultations: Psychiatry  Procedures/Studies: Dg Chest Port 1 View  Result Date: 06/17/2018 CLINICAL DATA:  Altered mental status.  History of  hypertension. EXAM: PORTABLE CHEST 1 VIEW COMPARISON:  Chest radiograph August 31, 2015 and chest CT September 02, 2015 FINDINGS: There is no edema or consolidation. Heart is borderline prominent with pulmonary vascularity normal. No adenopathy. There is aortic atherosclerosis. There is degenerative change in each shoulder. IMPRESSION: Borderline cardiac prominence. No edema or consolidation. Aortic atherosclerosis. Aortic Atherosclerosis (ICD10-I70.0). Electronically Signed   By: Bretta Bang III M.D.   On: 06/17/2018 15:27    (Echo, Carotid, EGD, Colonoscopy, ERCP)    Subjective: Sitting up in the recliner with legs elevated Foley catheter in place notes head yes and no to questions being asked appears to be in no acute distress.   Discharge Exam: Vitals:   06/23/18 0332 06/23/18 0847  BP: (!) 148/92 (!) 144/84  Pulse: 61 63  Resp: 14   Temp: 98.2 F (36.8 C)   SpO2: 99%    Vitals:   06/22/18 2024 06/23/18 0203 06/23/18 0332 06/23/18 0847  BP: (!) 160/95  (!) 148/92 (!) 144/84  Pulse: 68  61 63  Resp: 18  14   Temp: 98.2 F (36.8 C)  98.2 F (36.8 C)   TempSrc: Oral  Oral   SpO2: 97%  99%   Weight:  103.5 kg (228 lb 2.8 oz)    Height:        General: Pt is alert, awake, not in acute distress Cardiovascular: RRR, S1/S2 +, no rubs, no gallops Respiratory: CTA bilaterally, no wheezing, no rhonchi Abdominal: Soft, NT, ND, bowel sounds + Extremities: Trace edema bilateral    The results of significant diagnostics from this hospitalization (including imaging, microbiology, ancillary and laboratory) are listed below for reference.     Microbiology: Recent Results (from the past 240 hour(s))  Urine culture     Status: None   Collection Time: 06/17/18  3:52 PM  Result Value Ref Range Status   Specimen Description URINE, CLEAN CATCH  Final   Special Requests NONE  Final   Culture   Final    NO GROWTH Performed at [facility], [address] [place], [place]  [address]    Report Status 06/18/2018 FINAL  Final     Labs: BNP (last 3 results) Recent Labs    06/17/18 1254  BNP 50.7   Basic Metabolic Panel: Recent Labs  Lab 06/17/18 1254 06/18/18 0333 06/22/18 1115  NA 136 138 136  K 4.1 4.3 4.8  CL 95* 99 98  CO2 27 28 29   GLUCOSE 206* 146* 178*  BUN 16 13 26*  CREATININE 1.19 1.00 1.36*  CALCIUM 9.6 8.9 9.0   Liver Function Tests: Recent Labs  Lab 06/17/18 1254  AST 18  ALT 12  ALKPHOS 72  BILITOT 0.6  PROT 7.6  ALBUMIN 3.7   No results for input(s): LIPASE, AMYLASE in the last 168 hours. No results for input(s): AMMONIA in the last 168 hours. CBC: Recent Labs  Lab 06/17/18 1254 06/18/18 0333 06/22/18 1115  WBC 4.5 4.9 4.6  NEUTROABS 2.7  --   --   HGB 15.9 13.2 14.0  HCT 49.7 40.2 43.1  MCV 90.0 88.2 88.5  PLT 174 137* 148*   Cardiac Enzymes: Recent Labs  Lab 06/17/18 1522 06/17/18 2150 06/18/18 0333 06/18/18 0929  CKTOTAL 152  --   --   --   TROPONINI  --  0.44* 0.54* 0.28*   BNP: Invalid input(s): POCBNP CBG: Recent Labs  Lab 06/22/18 0744 06/22/18 1123 06/22/18 1709 06/22/18 2131 06/23/18 0733  GLUCAP 86 172* 145* 106* 85   D-Dimer No results for input(s): DDIMER in the last 72 hours. Hgb A1c No results for input(s): HGBA1C in the last 72 hours. Lipid Profile No results for input(s): CHOL, HDL, LDLCALC, TRIG, CHOLHDL, LDLDIRECT in the last 72 hours. Thyroid function studies No results for input(s): TSH, T4TOTAL, T3FREE, THYROIDAB in the last 72 hours.  Invalid input(s): FREET3 Anemia work up No results for input(s): VITAMINB12, FOLATE, FERRITIN, TIBC, IRON, RETICCTPCT in the last 72 hours. Urinalysis    Component Value Date/Time   COLORURINE STRAW (A) 06/17/2018 1552   APPEARANCEUR CLEAR 06/17/2018 1552   APPEARANCEUR Cloudy 08/01/2014 1614   LABSPEC 1.006 06/17/2018 1552   LABSPEC 1.008 08/01/2014 1614   PHURINE 7.0 06/17/2018 1552   GLUCOSEU 50 (A) 06/17/2018 1552   GLUCOSEU  Negative 08/01/2014 1614   HGBUR NEGATIVE 06/17/2018 1552   BILIRUBINUR NEGATIVE 06/17/2018 1552   BILIRUBINUR Negative 08/01/2014 1614   KETONESUR NEGATIVE 06/17/2018 1552   PROTEINUR NEGATIVE 06/17/2018 1552   UROBILINOGEN 0.2 07/01/2014 1809   NITRITE NEGATIVE 06/17/2018 1552   LEUKOCYTESUR NEGATIVE 06/17/2018 1552   LEUKOCYTESUR Negative 08/01/2014 1614   Sepsis Labs Invalid input(s): PROCALCITONIN,  WBC,  LACTICIDVEN Microbiology Recent Results (from the past 240 hour(s))  Urine culture     Status: None   Collection Time: 06/17/18  3:52 PM  Result Value Ref Range Status   Specimen Description URINE, CLEAN CATCH  Final   Special Requests NONE  Final   Culture   Final    NO GROWTH Performed at [facility], [address] [place], [place] [address]    Report Status 06/18/2018 FINAL  Final     Time coordinating discharge: 34 minutes  SIGNED:   Alwyn Ren, MD  [facility] 06/23/2018, 10:33 AM Pager   If 7PM-7AM, please contact night-coverage [email or web address] Password [email or web address]

## 2018-06-23 NOTE — Progress Notes (Signed)
Called social work regarding pt discharge orders

## 2018-06-23 NOTE — Progress Notes (Signed)
Physical Therapy Treatment Patient Details Name: Christian [name] MRN: [id number] DOB: July 15, 1951 Today's Date: 06/23/2018    History of Present Illness 67 y.o. male presenting to ED for weakness and increased urine output with foul odor with elevated troponin. PMHx: DM, CKD, bipolar, HTN    PT Comments    Pt with flat affect in bed after finishing breakfast. Pt with very limited interaction although speaking more than prior session. Pt would not attempt to walk today due to resistance to mobility and self limiting behavior. Pt encouraged to be OOB and educated for fact he walked last session as he states he does not remember.     Follow Up Recommendations  SNF;Supervision/Assistance - 24 hour     Equipment Recommendations       Recommendations for Other Services       Precautions / Restrictions Precautions Precautions: Fall    Mobility  Bed Mobility Overal bed mobility: Needs Assistance Bed Mobility: Supine to Sit Rolling: Min assist   Supine to sit: Min assist;HOB elevated     General bed mobility comments: HOB 20 degrees, max tactile cues with min assist to roll to right then min assist to initiate RLE off of bed with pt able to elevate trunk on his own  Transfers Overall transfer level: Needs assistance   Transfers: Sit to/from Stand;Stand Pivot Transfers Sit to Stand: Mod assist Stand pivot transfers: Min assist;+2 physical assistance       General transfer comment: from bed pt with mod assist to stand with noted wound on left glute with pt refusing to attempt stepping and would only pivot bed to chair with rW with +2 for safety as pt somewhat impulsive. Pt stood min assist from recliner with cues for hand placement for 70 sec for pericare and dressing application  Ambulation/Gait             General Gait Details: pt refused   Stairs             Wheelchair Mobility    Modified Rankin (Stroke Patients Only)       Balance Overall balance  assessment: Needs assistance Sitting-balance support: No upper extremity supported;Feet supported Sitting balance-Leahy Scale: Fair       Standing balance-Leahy Scale: Poor Standing balance comment: RW with bil UE support in standing                            Cognition Arousal/Alertness: Awake/alert Behavior During Therapy: Flat affect Overall Cognitive Status: No family/caregiver present to determine baseline cognitive functioning                                 General Comments: pt with flat affect, limited interaction and participation who states "i can't" or stop to limit function. Pt using calendar for date      Exercises      General Comments        Pertinent Vitals/Pain Pain Assessment: No/denies pain    Home Living                      Prior Function            PT Goals (current goals can now be found in the care plan section) Progress towards PT goals: Progressing toward goals    Frequency    Min 2X/week      PT Plan  Current plan remains appropriate    Co-evaluation              AM-PAC PT "6 Clicks" Daily Activity  Outcome Measure  Difficulty turning over in bed (including adjusting bedclothes, sheets and blankets)?: None Difficulty moving from lying on back to sitting on the side of the bed? : A Little Difficulty sitting down on and standing up from a chair with arms (e.g., wheelchair, bedside commode, etc,.)?: Unable Help needed moving to and from a bed to chair (including a wheelchair)?: A Little Help needed walking in hospital room?: A Lot Help needed climbing 3-5 steps with a railing? : A Lot 6 Click Score: 15    End of Session Equipment Utilized During Treatment: Gait belt Activity Tolerance: Patient tolerated treatment well Patient left: in chair;with call bell/phone within reach;with chair alarm set Nurse Communication: Mobility status PT Visit Diagnosis: Muscle weakness (generalized)  (M62.81);History of falling (Z91.81)     Time: 1610-9604 PT Time Calculation (min) (ACUTE ONLY): 18 min  Charges:  $Therapeutic Activity: 8-22 mins                     Delaney Meigs, PT [phone]    Enedina Finner Ledia Hanford 06/23/2018, 10:44 AM

## 2018-06-23 NOTE — Progress Notes (Signed)
Report called to [facility], spoke to [place]  Awaiting PTAR

## 2018-06-23 NOTE — Clinical Social Work Note (Signed)
CSW facilitated patient discharge including contacting patient family and facility to confirm patient discharge plans. Clinical information faxed to facility and family agreeable with plan. CSW arranged ambulance transport via PTAR to [facility] at 1:30 pm. RN to call report prior to discharge [phone] Room 601, [facility]).  CSW will sign off for now as social work intervention is no longer needed. Please consult [place] again if new needs arise.  Charlynn Court, CSW [phone]

## 2018-06-23 NOTE — Progress Notes (Signed)
PTAR at bedside. Report given to transport. Pt has all belongings. PTAR has all paper work including AVS. NT removed IV and telemetry. Pt discharged via stretcher

## 2018-08-01 ENCOUNTER — Other Ambulatory Visit: Payer: Self-pay

## 2018-08-01 ENCOUNTER — Emergency Department (HOSPITAL_COMMUNITY)
Admission: EM | Admit: 2018-08-01 | Discharge: 2018-08-04 | Disposition: A | Discharge status: Home / Self Care | Payer: Medicare PPO | Attending: Emergency Medicine | Admitting: Emergency Medicine

## 2018-08-01 ENCOUNTER — Encounter (HOSPITAL_COMMUNITY): Payer: Self-pay

## 2018-08-01 ENCOUNTER — Emergency Department (HOSPITAL_COMMUNITY): Payer: Medicare PPO

## 2018-08-01 DIAGNOSIS — I480 Paroxysmal atrial fibrillation: Secondary | ICD-10-CM | POA: Diagnosis not present

## 2018-08-01 DIAGNOSIS — G9340 Encephalopathy, unspecified: Secondary | ICD-10-CM

## 2018-08-01 DIAGNOSIS — F315 Bipolar disorder, current episode depressed, severe, with psychotic features: Secondary | ICD-10-CM | POA: Diagnosis present

## 2018-08-01 DIAGNOSIS — Z79899 Other long term (current) drug therapy: Secondary | ICD-10-CM | POA: Diagnosis not present

## 2018-08-01 DIAGNOSIS — Z794 Long term (current) use of insulin: Secondary | ICD-10-CM | POA: Diagnosis not present

## 2018-08-01 DIAGNOSIS — Z818 Family history of other mental and behavioral disorders: Secondary | ICD-10-CM | POA: Diagnosis not present

## 2018-08-01 DIAGNOSIS — R4182 Altered mental status, unspecified: Secondary | ICD-10-CM | POA: Diagnosis present

## 2018-08-01 DIAGNOSIS — Z86718 Personal history of other venous thrombosis and embolism: Secondary | ICD-10-CM | POA: Diagnosis not present

## 2018-08-01 DIAGNOSIS — Z7901 Long term (current) use of anticoagulants: Secondary | ICD-10-CM | POA: Insufficient documentation

## 2018-08-01 DIAGNOSIS — Z810 Family history of intellectual disabilities: Secondary | ICD-10-CM | POA: Diagnosis not present

## 2018-08-01 DIAGNOSIS — I129 Hypertensive chronic kidney disease with stage 1 through stage 4 chronic kidney disease, or unspecified chronic kidney disease: Secondary | ICD-10-CM | POA: Diagnosis not present

## 2018-08-01 DIAGNOSIS — E785 Hyperlipidemia, unspecified: Secondary | ICD-10-CM | POA: Insufficient documentation

## 2018-08-01 DIAGNOSIS — N189 Chronic kidney disease, unspecified: Secondary | ICD-10-CM | POA: Insufficient documentation

## 2018-08-01 DIAGNOSIS — E1122 Type 2 diabetes mellitus with diabetic chronic kidney disease: Secondary | ICD-10-CM | POA: Insufficient documentation

## 2018-08-01 LAB — CBC WITH DIFFERENTIAL/PLATELET
Basophils Absolute: 0 10*3/uL (ref 0.0–0.1)
Basophils Relative: 0 %
Eosinophils Absolute: 0 10*3/uL (ref 0.0–0.7)
Eosinophils Relative: 1 %
HCT: 44.5 % (ref 39.0–52.0)
Hemoglobin: 15.1 g/dL (ref 13.0–17.0)
Lymphocytes Relative: 14 %
Lymphs Abs: 1 10*3/uL (ref 0.7–4.0)
MCH: 29.5 pg (ref 26.0–34.0)
MCHC: 33.9 g/dL (ref 30.0–36.0)
MCV: 86.9 fL (ref 78.0–100.0)
Monocytes Absolute: 0.8 10*3/uL (ref 0.1–1.0)
Monocytes Relative: 11 %
Neutro Abs: 5.1 10*3/uL (ref 1.7–7.7)
Neutrophils Relative %: 74 %
Platelets: 217 10*3/uL (ref 150–400)
RBC: 5.12 MIL/uL (ref 4.22–5.81)
RDW: 12.5 % (ref 11.5–15.5)
WBC: 6.9 10*3/uL (ref 4.0–10.5)

## 2018-08-01 LAB — COMPREHENSIVE METABOLIC PANEL
ALT: 14 U/L (ref 0–44)
AST: 18 U/L (ref 15–41)
Albumin: 3.5 g/dL (ref 3.5–5.0)
Alkaline Phosphatase: 88 U/L (ref 38–126)
Anion gap: 15 (ref 5–15)
BUN: 20 mg/dL (ref 8–23)
CO2: 29 mmol/L (ref 22–32)
Calcium: 9.5 mg/dL (ref 8.9–10.3)
Chloride: 97 mmol/L — ABNORMAL LOW (ref 98–111)
Creatinine, Ser: 1.2 mg/dL (ref 0.61–1.24)
GFR calc Af Amer: >60 mL/min (ref >60)
GFR calc non Af Amer: >60 mL/min (ref >60)
Glucose, Bld: 242 mg/dL — ABNORMAL HIGH (ref 70–99)
Potassium: 4.1 mmol/L (ref 3.5–5.1)
Sodium: 141 mmol/L (ref 135–145)
Total Bilirubin: 0.9 mg/dL (ref 0.3–1.2)
Total Protein: 7.4 g/dL (ref 6.5–8.1)

## 2018-08-01 LAB — URINALYSIS, ROUTINE W REFLEX MICROSCOPIC
Bacteria, UA: NONE SEEN
Bilirubin Urine: NEGATIVE
Glucose, UA: >=500 mg/dL — AB
Hgb urine dipstick: NEGATIVE
Ketones, ur: 5 mg/dL — AB
Leukocytes, UA: NEGATIVE
Nitrite: NEGATIVE
Protein, ur: 100 mg/dL — AB
Specific Gravity, Urine: 1.016 (ref 1.005–1.030)
pH: 7 (ref 5.0–8.0)

## 2018-08-01 LAB — AMMONIA: Ammonia: 28 umol/L (ref 9–35)

## 2018-08-01 LAB — VALPROIC ACID LEVEL: Valproic Acid Lvl: 38 ug/mL — ABNORMAL LOW (ref 50.0–100.0)

## 2018-08-01 LAB — CBG MONITORING, ED: Glucose-Capillary: 204 mg/dL — ABNORMAL HIGH (ref 70–99)

## 2018-08-01 MED ORDER — AMLODIPINE BESYLATE 5 MG PO TABS
10.0000 mg | ORAL_TABLET | ORAL | Status: DC
  Administered 2018-08-03 – 2018-08-04 (×2): 10 mg via ORAL
  Filled 2018-08-01 (×2): qty 2

## 2018-08-01 MED ORDER — INSULIN ASPART 100 UNIT/ML ~~LOC~~ SOLN
0.0000 [IU] | Freq: Every day | SUBCUTANEOUS | Status: DC
  Administered 2018-08-01 – 2018-08-02 (×2): 2 [IU] via SUBCUTANEOUS
  Filled 2018-08-01 (×2): qty 1

## 2018-08-01 MED ORDER — CLONIDINE HCL 0.1 MG PO TABS
0.2000 mg | ORAL_TABLET | Freq: Two times a day (BID) | ORAL | Status: DC
  Administered 2018-08-01 – 2018-08-04 (×6): 0.2 mg via ORAL
  Filled 2018-08-01 (×6): qty 2

## 2018-08-01 MED ORDER — METFORMIN HCL ER 500 MG PO TB24
1000.0000 mg | ORAL_TABLET | Freq: Every day | ORAL | Status: DC
  Administered 2018-08-01 – 2018-08-02 (×2): 1000 mg via ORAL
  Filled 2018-08-01 (×3): qty 2

## 2018-08-01 MED ORDER — INSULIN ASPART 100 UNIT/ML ~~LOC~~ SOLN
0.0000 [IU] | Freq: Three times a day (TID) | SUBCUTANEOUS | Status: DC
  Administered 2018-08-02 (×2): 3 [IU] via SUBCUTANEOUS
  Administered 2018-08-03: 2 [IU] via SUBCUTANEOUS
  Administered 2018-08-03: 3 [IU] via SUBCUTANEOUS
  Filled 2018-08-01 (×4): qty 1

## 2018-08-01 MED ORDER — DIVALPROEX SODIUM 500 MG PO DR TAB
1000.0000 mg | DELAYED_RELEASE_TABLET | Freq: Every day | ORAL | Status: DC
  Administered 2018-08-01 – 2018-08-03 (×3): 1000 mg via ORAL
  Filled 2018-08-01 (×3): qty 2

## 2018-08-01 MED ORDER — SENNA 8.6 MG PO TABS
1.0000 | ORAL_TABLET | Freq: Two times a day (BID) | ORAL | Status: DC
  Administered 2018-08-01 – 2018-08-04 (×5): 8.6 mg via ORAL
  Filled 2018-08-01 (×5): qty 1

## 2018-08-01 MED ORDER — ATORVASTATIN CALCIUM 10 MG PO TABS
10.0000 mg | ORAL_TABLET | Freq: Every day | ORAL | Status: DC
  Administered 2018-08-01 – 2018-08-04 (×4): 10 mg via ORAL
  Filled 2018-08-01 (×4): qty 1

## 2018-08-01 MED ORDER — FUROSEMIDE 20 MG PO TABS
20.0000 mg | ORAL_TABLET | Freq: Every day | ORAL | Status: DC
  Administered 2018-08-01 – 2018-08-04 (×4): 20 mg via ORAL
  Filled 2018-08-01 (×4): qty 1

## 2018-08-01 MED ORDER — HYDROCHLOROTHIAZIDE 12.5 MG PO CAPS
12.5000 mg | ORAL_CAPSULE | Freq: Every day | ORAL | Status: DC
  Administered 2018-08-01 – 2018-08-04 (×4): 12.5 mg via ORAL
  Filled 2018-08-01 (×4): qty 1

## 2018-08-01 MED ORDER — BUPROPION HCL ER (XL) 150 MG PO TB24
150.0000 mg | ORAL_TABLET | Freq: Every day | ORAL | Status: DC
  Administered 2018-08-01 – 2018-08-04 (×4): 150 mg via ORAL
  Filled 2018-08-01 (×4): qty 1

## 2018-08-01 MED ORDER — FLUOXETINE HCL 20 MG PO CAPS
20.0000 mg | ORAL_CAPSULE | Freq: Every day | ORAL | Status: DC
  Administered 2018-08-01 – 2018-08-02 (×2): 20 mg via ORAL
  Filled 2018-08-01 (×2): qty 1

## 2018-08-01 MED ORDER — SODIUM CHLORIDE 0.9 % IV BOLUS
500.0000 mL | Freq: Once | INTRAVENOUS | Status: AC
Start: 2018-08-01 — End: 2018-08-01
  Administered 2018-08-01: 500 mL via INTRAVENOUS

## 2018-08-01 NOTE — BH Assessment (Signed)
[facility] Assessment Progress Note  Pt's wife, [name], is pt's POA. Pt's daughter, [name], is also heavily involved and is in discussions to obtain guardianship. Wife's # is [phone]. Daughter's # is [phone]. Please call them with any updates for patient.   Christian [name]. Ladona Ridgel, MS, NCC, LPCA Counselor

## 2018-08-01 NOTE — ED Notes (Signed)
Bed: WA04 Expected date:  Expected time:  Means of arrival:  Comments: Hold for TCU 29

## 2018-08-01 NOTE — ED Notes (Signed)
Pt. Has 3 healed pressure ulcers on bottom and is blanchable.

## 2018-08-01 NOTE — ED Notes (Signed)
[facility] ACT team [phone]. Do not give number to family or pt

## 2018-08-01 NOTE — ED Provider Notes (Signed)
[facility]-EMERGENCY DEPT Provider Note   CSN: [id number] Arrival date & time: 08/01/18  1353     History   Chief Complaint Chief Complaint  Patient presents with  . Failure To Thrive    IVC  . Medical Clearance    HPI Christian [name] is a 67 y.o. male. Level 5 caveat due to altered mental status. HPI Patient presents reportedly with mental status changes.  Has been reportedly refusing medicines.  Patient cannot really provide much history does have a relatively severe history of bipolar disorder.  Reportedly was hypertensive and temperature of 99.5 for EMS. Past Medical History:  Diagnosis Date  . 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 Bipolar 1 disorder (HCC)   . Bipolar disorder (HCC)   . Chronic kidney disease   . Gout   . Hx of blood clots   . Hypertension   . Mood swings   . OSA on CPAP 06/02/2014  . Other and unspecified hyperlipidemia 06/02/2014  . Type II or unspecified type diabetes mellitus with unspecified complication, uncontrolled 06/02/2014    Patient Active Problem List   Diagnosis Date Noted  . Paroxysmal atrial fibrillation (HCC) 06/18/2018  . History of pulmonary embolism 06/17/2018  . Generalized weakness 03/27/2018  . Diabetes mellitus type 2 in nonobese (HCC) 03/27/2018  . Pulmonary embolism (HCC) 03/27/2018  . Fall   . Laceration of eyebrow   . Bipolar I disorder, most recent episode depressed, severe w psychosis (HCC) 12/22/2015  . Bipolar affective disorder, depressed, severe, with psychotic behavior (HCC)   . Bipolar I disorder, current or most recent episode depressed, with psychotic features (HCC)   . Bipolar I disorder, most recent episode depressed (HCC)   . Severe bipolar I disorder with depression (HCC)   . Bipolar I disorder, most recent episode depressed, severe with psychotic features (HCC)   . Overdose 09/11/2015  . Acute respiratory failure with hypoxia (HCC) 09/05/2015    . Elevated troponin 09/05/2015  . Somnolence 09/05/2015  . Bipolar depression (HCC) 09/05/2015  . Acute bilateral deep vein thrombosis (DVT) of femoral veins (HCC) 09/05/2015  . Bilateral pulmonary embolism (HCC) 09/02/2015  . Labile hypertension   . Pulmonary embolism with acute cor pulmonale (HCC)   . Dyspnea   . Orthostatic hypotension   . Bipolar disorder, in partial remission, most recent episode depressed (HCC)   . Syncope, near 08/31/2015  . Affective psychosis, bipolar (HCC)   . Bipolar disorder, now depressed (HCC) 08/28/2015  . Bipolar I disorder, severe, current or most recent episode depressed, with psychotic features (HCC)   . Pressure ulcer 07/30/2015  . Protein-calorie malnutrition, severe (HCC) 07/30/2015  . Dehydration 07/29/2015  . Bipolar disorder, current episode depressed, severe, with psychotic features (HCC) 07/09/2015  . Diabetes (HCC) 07/09/2015  . UTI (urinary tract infection) 06/19/2014  . Positive blood culture 06/19/2014  . Bipolar affective disorder, current episode manic with psychotic symptoms (HCC) 06/17/2014  . Type 2 diabetes mellitus with complication, without long-term current use of insulin (HCC) 06/02/2014  . Other and unspecified hyperlipidemia 06/02/2014  . 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 Gout 06/02/2014  . Hypertension     History reviewed. No pertinent surgical history.      Home Medications    Prior to Admission medications   Medication Sig Start Date End Date Taking? Authorizing Provider  acetaminophen (TYLENOL) 325 MG tablet Take 2 tablets (650 mg total) by mouth  every 6 (six) hours as needed for mild pain (or Fever >/= 101). 06/23/18   Alwyn Ren, MD  amLODipine (NORVASC) 10 MG tablet Take 10 mg by mouth every morning.     [provider]  atorvastatin (LIPITOR) 10 MG tablet Take 10 mg by mouth every morning.    [provider]  buPROPion (WELLBUTRIN  XL) 150 MG 24 hr tablet Take 1 tablet (150 mg total) by mouth daily. 06/24/18   Alwyn Ren, MD  Cholecalciferol (VITAMIN D3) 1000 units CHEW Chew 1,000 Units by mouth daily.    [provider]  cloNIDine (CATAPRES) 0.2 MG tablet Take 0.2 mg by mouth 2 (two) times daily.    [provider]  divalproex (DEPAKOTE) 500 MG DR tablet Take 1,000 mg by mouth at bedtime.    [provider]  docusate sodium (COLACE) 100 MG capsule Take 1 capsule (100 mg total) by mouth 2 (two) times daily. 06/23/18   Alwyn Ren, MD  hydrochlorothiazide (MICROZIDE) 12.5 MG capsule Take 1 capsule (12.5 mg total) by mouth daily. 06/24/18   Alwyn Ren, MD  insulin aspart (NOVOLOG) 100 UNIT/ML injection Inject 0-9 Units into the skin 3 (three) times daily with meals. 06/23/18   Alwyn Ren, MD  latanoprost (XALATAN) 0.005 % ophthalmic solution Place 1 drop into the right eye at bedtime.    [provider]  metoprolol succinate (TOPROL-XL) 100 MG 24 hr tablet Take 100 mg by mouth 2 (two) times daily. Take with or immediately following a meal.    [provider]  rivaroxaban (XARELTO) 20 MG TABS tablet Take 1 tablet (20 mg total) by mouth daily with supper. 06/23/18   Alwyn Ren, MD    Family History Family History  Problem Relation Age of Onset  . Dementia Mother   . Arthritis Father   . Hypertension Father   . Mental illness Sister   . Diabetes Sister   . Heart failure Neg Hx   . Kidney failure Neg Hx   . Cancer Neg Hx     Social History Social History   Tobacco Use  . Smoking status: Never Smoker  . Smokeless tobacco: Never Used  Substance Use Topics  . Alcohol use: No  . Drug use: No     Allergies   Zyprexa [olanzapine]   Review of Systems Review of Systems  Unable to perform ROS: Mental status change     Physical Exam Updated Vital Signs BP (!) 156/93 (BP Location: Right Arm)   Pulse (!) 120   Temp 98.7 F  (37.1 C) (Oral)   Resp 20   SpO2 100%   Physical Exam  Constitutional: He appears well-developed.  HENT:  Head: Atraumatic.  Eyes: EOM are normal.  Neck: Neck supple.  Cardiovascular:  Tachycardia  Pulmonary/Chest: Effort normal.  Abdominal: He exhibits mass.  Tender suprapubic mass up to umbilicus.  Musculoskeletal: He exhibits edema.  Edema bilateral lower extremities.  Neurological: He is alert.  Patient is alert but somewhat confused.  Will answer some questions but very difficult to get history from.  Skin: Skin is warm. Capillary refill takes less than 2 seconds.  Psychiatric:  Somewhat flat affect.     ED Treatments / Results  Labs (all labs ordered are listed, but only abnormal results are displayed) Labs Reviewed  COMPREHENSIVE METABOLIC PANEL  CBC WITH DIFFERENTIAL/PLATELET  URINALYSIS, ROUTINE W REFLEX MICROSCOPIC  VALPROIC ACID LEVEL  AMMONIA    EKG None  Radiology No results found.  Procedures Procedures (including critical care time)  Medications Ordered in ED Medications  sodium chloride 0.9 % bolus 500 mL (has no administration in time range)     Initial Impression / Assessment and Plan / ED Course  I have reviewed the triage vital signs and the nursing notes.  Pertinent labs & imaging results that were available during my care of the patient were reviewed by me and considered in my medical decision making (see chart for details).     Patient brought in for mental status changes.  Reportedly more confused and more agitated.  Refusing medicines.  Appears to have a urinary retention with a large suprapubic mass.  Will check lab work.  May need some sedation to be able to complete work-up.  Care to be turned over to Dr. Anitra Lauth.  Final Clinical Impressions(s) / ED Diagnoses   Final diagnoses:  Encephalopathy    ED Discharge Orders    None       Benjiman Core, MD 08/01/18 [phone]

## 2018-08-01 NOTE — ED Provider Notes (Signed)
Assumed care at 1600.  Foley catheter placed in almost 1 L drained.  No evidence of UTI at this time.  Labs are still pending. 5:54 PM CBC and BMP without acute findings.  Speaking with patient's wife he has become more more uncooperative talking about dead people and visual hallucinations.  She states that this decline has been ongoing for the last year but now to the point he will not even bathe himself.  IVC by his ACT team.  TTS consulted.   Gwyneth Sprout, MD 08/01/18 1755

## 2018-08-01 NOTE — ED Notes (Signed)
Pt provided with meal tray.

## 2018-08-01 NOTE — ED Triage Notes (Signed)
Pt. Coming from [facility] facility via [facility]. EMT reported altered VS, BP- 186/ 138 and manual BP of 256/138, HR 130 and temp. of 99.5. CBG of 371. EMT states that patient has had increased agitation and has refused all meds, he was found soaked in urine and refusing treatment. In the Ambulance IV placed but patient ripped it out.

## 2018-08-01 NOTE — BH Assessment (Signed)
Assessment Note  Christian [name] is a 67 y.o. male in [facility] under IVC by his ACTT team ([facility]). Clinician spoke to [facility] from pt's ACTT team [phone]) for collateral information. She informed that pt has been progressively decompensating for the last 2 weeks, where he has been refusing meds, refusing to move (walk), refusing to toilet (resulting in enuresis & encopresis) and having hallucinations. Pt's psychiatrist saw pt 2 weeks ago and suggested that pt has another round of ECT treatments but pt refused.   Prior to being in the independent living facility where he now resides, pt was at [facility] from 01/2016-09/2017 (almost 2 years). Prior to that, pt was in and out of [facility] for extended periods of time.   Upon meeting pt for assessment, pt was noted to be heartily eating independently. He was able to provide his name and DOB. When asked why he was at the ED, he motioned to his wife Advertising account planner), who was in the room and said "she brought me here". He then repeatedly said, "I don't need to be here". Pt's wife provided hx, much the same as was given by the ACTT team provider. Wife shared that pt had been telling people "Why bother. It doesn't matter."   Pt denied SI, but did admit to not trying to do anything except sit at home (not toileting, walking, grooming, etc) in hopes that he would just waste away and die. It is noted that pt did not use these words. This clinician pointedly asked pt this and he reluctantly agreed. No other concerns.   Case staffed with Malachy Chamber, NP, and pt is recommended to be observed overnight for stability and seen by psychiatrist in AM.   Diagnosis: F31.5 Bipolar d/o, current episode depressed, severe, w/ psychotic features  Past Medical History:  Past Medical History:  Diagnosis Date  . 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 Bipolar 1 disorder (HCC)   . Bipolar disorder (HCC)   . Chronic kidney disease    . Gout   . Hx of blood clots   . Hypertension   . Mood swings   . OSA on CPAP 06/02/2014  . Other and unspecified hyperlipidemia 06/02/2014  . Type II or unspecified type diabetes mellitus with unspecified complication, uncontrolled 06/02/2014    History reviewed. No pertinent surgical history.  Family History:  Family History  Problem Relation Age of Onset  . Dementia Mother   . Arthritis Father   . Hypertension Father   . Mental illness Sister   . Diabetes Sister   . Heart failure Neg Hx   . Kidney failure Neg Hx   . Cancer Neg Hx     Social History:  reports that he has never smoked. He has never used smokeless tobacco. He reports that he does not drink alcohol or use drugs.  Additional Social History:  Alcohol / Drug Use Pain Medications: see MAR Prescriptions: see MAR Over the Counter: see MAR History of alcohol / drug use?: No history of alcohol / drug abuse  CIWA: CIWA-Ar BP: (!) 152/94 Pulse Rate: (!) 115 COWS:    Allergies:  Allergies  Allergen Reactions  . Zyprexa [Olanzapine] Other (See Comments)    Allergy noted by ACT Team. No reaction specified.    Home Medications:  (Not in a hospital admission)  OB/GYN Status:  No LMP for male patient.  General Assessment Data Location of Assessment: [facility] TTS Assessment: In system Is this a  Tele or Face-to-Face Assessment?: Face-to-Face Is this an Initial Assessment or a Re-assessment for this encounter?: Initial Assessment Patient Accompanied by:: Other(wife) Language Other than English: No Living Arrangements: In Assisted Living/Nursing Home (Comment: Name of Nursing Home([facility]) What gender do you identify as?: Male Marital status: Married Living Arrangements: Other (Comment) Can pt return to current living arrangement?: Yes Admission Status: Involuntary Petitioner: Other(ACTT team ([facility])) Is patient capable of signing voluntary admission?: No Referral  Source: Self/Family/Friend     Crisis Care Plan Living Arrangements: Other (Comment) Name of Psychiatrist: Dr Jeannine Kitten  Education Status Is patient currently in school?: No Is the patient employed, unemployed or receiving disability?: Receiving disability income, Unemployed  Risk to self with the past 6 months Suicidal Ideation: No Has patient been a risk to self within the past 6 months prior to admission? : No Suicidal Intent: No Has patient had any suicidal intent within the past 6 months prior to admission? : No Is patient at risk for suicide?: No Suicidal Plan?: No Has patient had any suicidal plan within the past 6 months prior to admission? : No Access to Means: No Intentional Self Injurious Behavior: None Family Suicide History: Unknown Persecutory voices/beliefs?: No Depression: Yes Depression Symptoms: Feeling angry/irritable, Feeling worthless/self pity, Loss of interest in usual pleasures Substance abuse history and/or treatment for substance abuse?: No Suicide prevention information given to non-admitted patients: Not applicable  Risk to Others within the past 6 months Homicidal Ideation: No Does patient have any lifetime risk of violence toward others beyond the six months prior to admission? : Unknown Thoughts of Harm to Others: No Current Homicidal Intent: No Current Homicidal Plan: No Access to Homicidal Means: No History of harm to others?: No Assessment of Violence: None Noted Does patient have access to weapons?: No Criminal Charges Pending?: No Does patient have a court date: No Is patient on probation?: No  Psychosis Hallucinations: None noted Delusions: None noted  Mental Status Report Appearance/Hygiene: Unremarkable Eye Contact: Fair Motor Activity: Unremarkable Speech: Slow Level of Consciousness: Alert Mood: Sad, Irritable Affect: Appropriate to circumstance Anxiety Level: Minimal Thought Processes: Irrelevant, Thought Blocking, Flight  of Ideas Judgement: Impaired Orientation: Person Obsessive Compulsive Thoughts/Behaviors: Unable to Assess  Cognitive Functioning Concentration: Fair Memory: Unable to Assess Is patient IDD: No Insight: Poor Impulse Control: Unable to Assess Appetite: Good Have you had any weight changes? : No Change Sleep: Unable to Assess Vegetative Symptoms: Not bathing, Decreased grooming  ADLScreening [facility] Assessment Services) Patient's cognitive ability adequate to safely complete daily activities?: Yes Patient able to express need for assistance with ADLs?: Yes Independently performs ADLs?: No  Prior Inpatient Therapy Prior Inpatient Therapy: Yes Prior Therapy Dates: several admissions over several years Prior Therapy Facilty/Provider(s): [facility], [facility] Reason for Treatment: bipolar  Prior Outpatient Therapy Prior Outpatient Therapy: No Does patient have an ACCT team?: Yes([facility]) Does patient have Intensive In-House Services?  : No Does patient have [facility] services? : No Does patient have P4CC services?: No  ADL Screening (condition at time of admission) Patient's cognitive ability adequate to safely complete daily activities?: Yes Is the patient deaf or have difficulty hearing?: No Does the patient have difficulty seeing, even when wearing glasses/contacts?: No Does the patient have difficulty concentrating, remembering, or making decisions?: Yes Patient able to express need for assistance with ADLs?: Yes Does the patient have difficulty dressing or bathing?: Yes Independently performs ADLs?: No Communication: Independent Dressing (OT): Needs assistance Is this a change from baseline?: Pre-admission baseline  Grooming: Needs assistance Is this a change from baseline?: Pre-admission baseline Feeding: Independent Bathing: Dependent Is this a change from baseline?: Pre-admission baseline Toileting: Dependent Is this a change from baseline?: Pre-admission  baseline In/Out Bed: Dependent Is this a change from baseline?: Pre-admission baseline Walks in Home: Dependent Is this a change from baseline?: Pre-admission baseline Does the patient have difficulty walking or climbing stairs?: Yes Weakness of Legs: Both Weakness of Arms/Hands: None  Home Assistive Devices/Equipment Home Assistive Devices/Equipment: Environmental consultant (specify type)    Abuse/Neglect Assessment (Assessment to be complete while patient is alone) Abuse/Neglect Assessment Can Be Completed: Unable to assess, patient is non-responsive or altered mental status     Advance Directives (For Healthcare) Does Patient Have a Medical Advance Directive?: No Would patient like information on creating a medical advance directive?: No - Patient declined          Disposition:  Disposition Initial Assessment Completed for this Encounter: Yes  On Site Evaluation by:   Reviewed with Physician:    Laddie Aquas 08/01/2018 6:46 PM

## 2018-08-02 DIAGNOSIS — Z810 Family history of intellectual disabilities: Secondary | ICD-10-CM

## 2018-08-02 DIAGNOSIS — F419 Anxiety disorder, unspecified: Secondary | ICD-10-CM

## 2018-08-02 DIAGNOSIS — F315 Bipolar disorder, current episode depressed, severe, with psychotic features: Secondary | ICD-10-CM

## 2018-08-02 DIAGNOSIS — G9340 Encephalopathy, unspecified: Secondary | ICD-10-CM

## 2018-08-02 DIAGNOSIS — Z818 Family history of other mental and behavioral disorders: Secondary | ICD-10-CM

## 2018-08-02 LAB — RAPID URINE DRUG SCREEN, HOSP PERFORMED
Amphetamines: NOT DETECTED
Barbiturates: NOT DETECTED
Benzodiazepines: NOT DETECTED
Cocaine: NOT DETECTED
Opiates: NOT DETECTED
Tetrahydrocannabinol: NOT DETECTED

## 2018-08-02 LAB — URINALYSIS, ROUTINE W REFLEX MICROSCOPIC
Bilirubin Urine: NEGATIVE
Glucose, UA: NEGATIVE mg/dL
Ketones, ur: 5 mg/dL — AB
Nitrite: NEGATIVE
Protein, ur: 30 mg/dL — AB
Specific Gravity, Urine: 1.018 (ref 1.005–1.030)
pH: 7 (ref 5.0–8.0)

## 2018-08-02 LAB — CBG MONITORING, ED
Glucose-Capillary: 117 mg/dL — ABNORMAL HIGH (ref 70–99)
Glucose-Capillary: 115 mg/dL — ABNORMAL HIGH (ref 70–99)
Glucose-Capillary: 152 mg/dL — ABNORMAL HIGH (ref 70–99)
Glucose-Capillary: 160 mg/dL — ABNORMAL HIGH (ref 70–99)
Glucose-Capillary: 237 mg/dL — ABNORMAL HIGH (ref 70–99)

## 2018-08-02 NOTE — ED Notes (Signed)
Pt attempted to pull out his IV. RN verbally redirected pt and did not pull out the IV. IV in place at this time.

## 2018-08-02 NOTE — ED Notes (Signed)
Bed: WA26 Expected date:  Expected time:  Means of arrival:  Comments: Room 4 

## 2018-08-02 NOTE — Consult Note (Signed)
[facility] Face-to-Face Psychiatry Consult   Reason for Consult:  Dementia Referring Physician:  EDP Patient Identification: ASHFORD CLOUSE MRN:  [id number] Principal Diagnosis: Bipolar disorder, current episode depressed, severe, with psychotic features [facility]) Diagnosis:   Patient Active Problem List   Diagnosis Date Noted  . Encephalopathy [G93.40]   . Paroxysmal atrial fibrillation ([place]) [I48.0] 06/18/2018  . History of pulmonary embolism [Z86.711] 06/17/2018  . Generalized weakness [R53.1] 03/27/2018  . Diabetes mellitus type 2 in nonobese ([place]) [E11.9] 03/27/2018  . Pulmonary embolism ([place]) [I26.99] 03/27/2018  . Fall [W19.XXXA]   . Laceration of eyebrow [S01.119A]   . Bipolar I disorder, most recent episode depressed, severe w psychosis ([place]) [F31.5] 12/22/2015  . Bipolar affective disorder, depressed, severe, with psychotic behavior ([place]) [F31.5]   . Bipolar I disorder, current or most recent episode depressed, with psychotic features ([place]) [F31.5]   . Bipolar I disorder, most recent episode depressed ([place]) [F31.30]   . Severe bipolar I disorder with depression ([place]) [F31.4]   . Bipolar I disorder, most recent episode depressed, severe with psychotic features ([place]) [F31.5]   . Overdose [T50.901A] 09/11/2015  . Acute respiratory failure with hypoxia ([place]) [J96.01] 09/05/2015  . Elevated troponin [R74.8] 09/05/2015  . Somnolence [R40.0] 09/05/2015  . Bipolar depression ([place]) [F31.9] 09/05/2015  . Acute bilateral deep vein thrombosis (DVT) of femoral veins (HCC) [I82.413] 09/05/2015  . Bilateral pulmonary embolism ([place]) [I26.99] 09/02/2015  . Labile hypertension [R09.89]   . Pulmonary embolism with acute cor pulmonale ([place]) [I26.09]   . Dyspnea [R06.00]   . Orthostatic hypotension [I95.1]   . Bipolar disorder, in partial remission, most recent episode depressed ([place]) [F31.75]   . Syncope, near [R55] 08/31/2015  . Affective psychosis, bipolar ([place]) [F31.9]   . Bipolar disorder, now  depressed ([place]) [F31.30] 08/28/2015  . Bipolar I disorder, severe, current or most recent episode depressed, with psychotic features ([place]) [F31.5]   . Pressure ulcer [L89.90] 07/30/2015  . Protein-calorie malnutrition, severe ([place]) [E43] 07/30/2015  . Dehydration [E86.0] 07/29/2015  . Bipolar disorder, current episode depressed, severe, with psychotic features ([place]) [F31.5] 07/09/2015  . Diabetes ([place]) [E11.9] 07/09/2015  . UTI (urinary tract infection) [N39.0] 06/19/2014  . Positive blood culture [R78.81] 06/19/2014  . Bipolar affective disorder, current episode manic with psychotic symptoms ([place]) [F31.2] 06/17/2014  . Type 2 diabetes mellitus with complication, without long-term current use of insulin ([place]) [E11.8] 06/02/2014  . Other and unspecified hyperlipidemia [E78.5] 06/02/2014  . 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 Gout [M10.9] 06/02/2014  . Hypertension [I10]     Total Time spent with patient: 45 minutes  Subjective:   Christian [name] is a 67 y.o. male patient admitted with hallucinations and dementia.  HPI:  Pt was seen and chart reviewed with treatment team and Dr Dwyane Dee. Pt lives with his wife and has an ACT team with [facility]. Pt has become increasingly more bizarre and has stopped taking care of his own daily needs. Pt has a long history of Bipolar and has had multiple hospitalizations. Pt was able to answer A&O questions and was able to stated that "he just did not feel well" but could not elaborate n this. Pt appears to have tardive dyskinesia due to rolling of his lips and mouth smacking. Pt's wife stated Pt has been talking about people who are dead and also having visual hallucinations. Pt's UDS and BAL are negative. EKG was reviewed and is within normal limits. Pt's home  medications were restarted.  Pt was IVC'd by his ACT team. Pt would benefit from an inpatient psychiatric admission for crisis  stabilization and medication management.   Past Psychiatric History: As above  Risk to Self: Suicidal Ideation: No Suicidal Intent: No Is patient at risk for suicide?: No Suicidal Plan?: No Access to Means: No Intentional Self Injurious Behavior: None Risk to Others: Homicidal Ideation: No Thoughts of Harm to Others: No Current Homicidal Intent: No Current Homicidal Plan: No Access to Homicidal Means: No History of harm to others?: No Assessment of Violence: None Noted Does patient have access to weapons?: No Criminal Charges Pending?: No Does patient have a court date: No Prior Inpatient Therapy: Prior Inpatient Therapy: Yes Prior Therapy Dates: several admissions over several years Prior Therapy Facilty/Provider(s): [facility], [facility] Reason for Treatment: bipolar Prior Outpatient Therapy: Prior Outpatient Therapy: No Does patient have an ACCT team?: Yes([facility]) Does patient have Intensive In-House Services?  : No Does patient have [facility] services? : No Does patient have P4CC services?: No  Past Medical History:  Past Medical History:  Diagnosis Date  . 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 Bipolar 1 disorder ([place])   . Bipolar disorder ([place])   . Chronic kidney disease   . Gout   . Hx of blood clots   . Hypertension   . Mood swings   . OSA on CPAP 06/02/2014  . Other and unspecified hyperlipidemia 06/02/2014  . Type II or unspecified type diabetes mellitus with unspecified complication, uncontrolled 06/02/2014   History reviewed. No pertinent surgical history. Family History:  Family History  Problem Relation Age of Onset  . Dementia Mother   . Arthritis Father   . Hypertension Father   . Mental illness Sister   . Diabetes Sister   . Heart failure Neg Hx   . Kidney failure Neg Hx   . Cancer Neg Hx    Family Psychiatric  History: Unknown Social History:  Social History   Substance and Sexual Activity  Alcohol  Use No     Social History   Substance and Sexual Activity  Drug Use No    Social History   Socioeconomic History  . Marital status: Divorced    Spouse name: Not on file  . Number of children: Not on file  . Years of education: Not on file  . Highest education level: Not on file  Occupational History  . Not on file  Social Needs  . Financial resource strain: Not on file  . Food insecurity:    Worry: Not on file    Inability: Not on file  . Transportation needs:    Medical: Not on file    Non-medical: Not on file  Tobacco Use  . Smoking status: Never Smoker  . Smokeless tobacco: Never Used  Substance and Sexual Activity  . Alcohol use: No  . Drug use: No  . Sexual activity: Not Currently  Lifestyle  . Physical activity:    Days per week: Not on file    Minutes per session: Not on file  . Stress: Not on file  Relationships  . Social connections:    Talks on phone: Not on file    Gets together: Not on file    Attends religious service: Not on file    Active member of club or organization: Not on file    Attends meetings of clubs or organizations: Not on file    Relationship status: Not on  file  Other Topics Concern  . Not on file  Social History Narrative   *The patient has been married for over 30 years and is currently on disability. He lives with his wife in the [place] area and has one daughter.             Additional Social History:    Allergies:   Allergies  Allergen Reactions  . Zyprexa [Olanzapine] Other (See Comments)    Allergy noted by ACT Team. No reaction specified.    Labs:  Results for orders placed or performed during the hospital encounter of 08/01/18 (from the past 48 hour(s))  Comprehensive metabolic panel     Status: Abnormal   Collection Time: 08/01/18  3:44 PM  Result Value Ref Range   Sodium 141 135 - 145 mmol/L   Potassium 4.1 3.5 - 5.1 mmol/L   Chloride 97 (L) 98 - 111 mmol/L   CO2 29 22 - 32 mmol/L   Glucose, Bld 242 (H) 70  - 99 mg/dL   BUN 20 8 - 23 mg/dL   Creatinine, Ser 1.20 0.61 - 1.24 mg/dL   Calcium 9.5 8.9 - 10.3 mg/dL   Total Protein 7.4 6.5 - 8.1 g/dL   Albumin 3.5 3.5 - 5.0 g/dL   AST 18 15 - 41 U/L   ALT 14 0 - 44 U/L   Alkaline Phosphatase 88 38 - 126 U/L   Total Bilirubin 0.9 0.3 - 1.2 mg/dL   GFR calc non Af Amer >60 >60 mL/min   GFR calc Af Amer >60 >60 mL/min    Comment: (NOTE) The eGFR has been calculated using the CKD EPI equation. This calculation has not been validated in all clinical situations. eGFR's persistently <60 mL/min signify possible Chronic Kidney Disease.    Anion gap 15 5 - 15    Comment: Performed at [facility], [place] [address], [place], [place] [address]  CBC with Differential     Status: None   Collection Time: 08/01/18  3:44 PM  Result Value Ref Range   WBC 6.9 4.0 - 10.5 K/uL   RBC 5.12 4.22 - 5.81 MIL/uL   Hemoglobin 15.1 13.0 - 17.0 g/dL   HCT 44.5 39.0 - 52.0 %   MCV 86.9 78.0 - 100.0 fL   MCH 29.5 26.0 - 34.0 pg   MCHC 33.9 30.0 - 36.0 g/dL   RDW 12.5 11.5 - 15.5 %   Platelets 217 150 - 400 K/uL   Neutrophils Relative % 74 %   Neutro Abs 5.1 1.7 - 7.7 K/uL   Lymphocytes Relative 14 %   Lymphs Abs 1.0 0.7 - 4.0 K/uL   Monocytes Relative 11 %   Monocytes Absolute 0.8 0.1 - 1.0 K/uL   Eosinophils Relative 1 %   Eosinophils Absolute 0.0 0.0 - 0.7 K/uL   Basophils Relative 0 %   Basophils Absolute 0.0 0.0 - 0.1 K/uL    Comment: Performed at [facility], [place] [address], [place], [place] [address]  Urinalysis, Routine w reflex microscopic     Status: Abnormal   Collection Time: 08/01/18  3:44 PM  Result Value Ref Range   Color, Urine YELLOW YELLOW   APPearance CLEAR CLEAR   Specific Gravity, Urine 1.016 1.005 - 1.030   pH 7.0 5.0 - 8.0   Glucose, UA >=500 (A) NEGATIVE mg/dL   Hgb urine dipstick NEGATIVE NEGATIVE   Bilirubin Urine NEGATIVE NEGATIVE   Ketones, ur 5 (A) NEGATIVE mg/dL  Protein, ur 100 (A)  NEGATIVE mg/dL   Nitrite NEGATIVE NEGATIVE   Leukocytes, UA NEGATIVE NEGATIVE   RBC / HPF 6-10 0 - 5 RBC/hpf   WBC, UA 0-5 0 - 5 WBC/hpf   Bacteria, UA NONE SEEN NONE SEEN    Comment: Performed at [facility], [place] [address], [place], [place] [address]  Valproic acid level     Status: Abnormal   Collection Time: 08/01/18  3:44 PM  Result Value Ref Range   Valproic Acid Lvl 38 (L) 50.0 - 100.0 ug/mL    Comment: Performed at [facility], [place] [address] [place], [place] [address]  Ammonia     Status: None   Collection Time: 08/01/18  3:44 PM  Result Value Ref Range   Ammonia 28 9 - 35 umol/L    Comment: Performed at [facility], [place] [address], [place], [place] [address]  CBG monitoring, ED     Status: Abnormal   Collection Time: 08/01/18  9:34 PM  Result Value Ref Range   Glucose-Capillary 204 (H) 70 - 99 mg/dL  CBG monitoring, ED     Status: Abnormal   Collection Time: 08/02/18  6:52 AM  Result Value Ref Range   Glucose-Capillary 117 (H) 70 - 99 mg/dL   Comment 1 Notify RN    Comment 2 Document in Chart   POC CBG, ED     Status: Abnormal   Collection Time: 08/02/18  8:01 AM  Result Value Ref Range   Glucose-Capillary 115 (H) 70 - 99 mg/dL   Comment 1 Notify RN     Current Facility-Administered Medications  Medication Dose Route Frequency Provider Last Rate Last Dose  . amLODipine (NORVASC) tablet 10 mg  10 mg Oral Cephus Slater, MD   Stopped at 08/02/18 [phone]  . atorvastatin (LIPITOR) tablet 10 mg  10 mg Oral Daily Blanchie Dessert, MD   10 mg at 08/02/18 0948  . buPROPion (WELLBUTRIN XL) 24 hr tablet 150 mg  150 mg Oral Daily Blanchie Dessert, MD   150 mg at 08/02/18 0948  . cloNIDine (CATAPRES) tablet 0.2 mg  0.2 mg Oral BID Blanchie Dessert, MD   0.2 mg at 08/02/18 0949  . divalproex (DEPAKOTE) DR tablet 1,000 mg  1,000 mg Oral QHS Blanchie Dessert, MD   1,000 mg at 08/01/18 2113  . furosemide  (LASIX) tablet 20 mg  20 mg Oral Daily Blanchie Dessert, MD   20 mg at 08/02/18 0948  . hydrochlorothiazide (MICROZIDE) capsule 12.5 mg  12.5 mg Oral Daily Blanchie Dessert, MD   12.5 mg at 08/02/18 0948  . insulin aspart (novoLOG) injection 0-15 Units  0-15 Units Subcutaneous TID WC Plunkett, Whitney, MD      . insulin aspart (novoLOG) injection 0-5 Units  0-5 Units Subcutaneous QHS Blanchie Dessert, MD   2 Units at 08/01/18 2212  . metFORMIN (GLUCOPHAGE-XR) 24 hr tablet 1,000 mg  1,000 mg Oral Q supper Blanchie Dessert, MD   1,000 mg at 08/01/18 2115  . senna (SENOKOT) tablet 8.6 mg  1 tablet Oral BID Blanchie Dessert, MD   8.6 mg at 08/01/18 2115   Current Outpatient Medications  Medication Sig Dispense Refill  . amLODipine (NORVASC) 10 MG tablet Take 10 mg by mouth every morning.     Marland Kitchen atorvastatin (LIPITOR) 10 MG tablet Take 10 mg by mouth every morning.    Marland Kitchen buPROPion (WELLBUTRIN XL) 150 MG 24 hr tablet Take 1 tablet (150 mg  total) by mouth daily. 30 tablet 0  . cloNIDine (CATAPRES) 0.2 MG tablet Take 0.2 mg by mouth 2 (two) times daily.    . divalproex (DEPAKOTE) 500 MG DR tablet Take 1,000 mg by mouth at bedtime.    Marland Kitchen FLUoxetine (PROZAC) 20 MG capsule Take 20 mg by mouth daily.    . furosemide (LASIX) 20 MG tablet Take 20 mg by mouth daily.    . hydrochlorothiazide (MICROZIDE) 12.5 MG capsule Take 1 capsule (12.5 mg total) by mouth daily. 30 capsule 0  . metFORMIN (GLUCOPHAGE-XR) 500 MG 24 hr tablet Take 1,000 mg by mouth daily.    Marland Kitchen senna (SENOKOT) 8.6 MG tablet Take 1 tablet by mouth 2 (two) times daily.    Marland Kitchen acetaminophen (TYLENOL) 325 MG tablet Take 2 tablets (650 mg total) by mouth every 6 (six) hours as needed for mild pain (or Fever >/= 101). (Patient not taking: Reported on 08/01/2018)    . docusate sodium (COLACE) 100 MG capsule Take 1 capsule (100 mg total) by mouth 2 (two) times daily. (Patient not taking: Reported on 08/01/2018) 10 capsule 0  . insulin aspart (NOVOLOG) 100  UNIT/ML injection Inject 0-9 Units into the skin 3 (three) times daily with meals. (Patient not taking: Reported on 08/01/2018) 10 mL 11  . rivaroxaban (XARELTO) 20 MG TABS tablet Take 1 tablet (20 mg total) by mouth daily with supper. (Patient not taking: Reported on 08/01/2018) 30 tablet 1    Musculoskeletal: Strength & Muscle Tone: within normal limits Gait & Station: not assessed Patient leans: N/A  Psychiatric Specialty Exam: Physical Exam  Constitutional: He is oriented to person, place, and time. He appears well-developed and well-nourished.  HENT:  Head: Normocephalic.  Neurological: He is alert and oriented to person, place, and time.  Psychiatric: His mood appears anxious. His speech is delayed. He is slowed. He expresses impulsivity. He exhibits a depressed mood. He exhibits abnormal recent memory and abnormal remote memory.    Review of Systems  Psychiatric/Behavioral: Positive for depression, hallucinations and memory loss. Negative for substance abuse and suicidal ideas. The patient is nervous/anxious. The patient does not have insomnia.   All other systems reviewed and are negative.   Blood pressure 131/79, pulse 88, temperature 98 F (36.7 C), temperature source Oral, resp. rate 18, SpO2 100 %.There is no height or weight on file to calculate BMI.  General Appearance: Disheveled  Eye Contact:  Fair  Speech:  Slow  Volume:  Decreased  Mood:  Depressed  Affect:  Depressed and Flat  Thought Process:  Coherent  Orientation:  Full (Time, Place, and Person)  Thought Content:  Hallucinations: Auditory Visual  Suicidal Thoughts:  UTA advanced dementia  Homicidal Thoughts:  UTA advanced dementia  Memory:  Immediate;   Fair Recent;   Fair Remote;   UTA advanced dementia  Judgement:  Impaired  Insight:  Present  Psychomotor Activity:  Decreased  Concentration:  Concentration: Fair and Attention Span: Fair  Recall:  [employer] of Knowledge:  Fair  Language:  Good   Akathisia:  No  Handed:  Right  AIMS (if indicated):     Assets:  Agricultural consultant Housing  ADL's:  Impaired  Cognition:  Impaired,  Moderate  Sleep:   Fair     Treatment Plan Summary: Daily contact with patient to assess and evaluate symptoms and progress in treatment and Medication management (see MAR )  Disposition: Recommend psychiatric Inpatient admission when medically cleared. TTS to seek placement  Ethelene Hal, NP 08/02/2018 12:15 PM

## 2018-08-02 NOTE — ED Provider Notes (Signed)
Patient alert, content, vitals normal.  Pt has been medically cleared, and is awaiting BH team placement to inpatient psychiatry bed.      Cathren Laine, MD 08/02/18 [phone]

## 2018-08-02 NOTE — ED Notes (Signed)
Awaiting Dinner tray at this time

## 2018-08-02 NOTE — BH Assessment (Addendum)
[facility] Assessment Progress Note  Per [name], MD, this pt requires psychiatric hospitalization at this time.  Pt presents under IVC initiated by pt's ACT Team staff from [facility], which Dr Lucianne Muss has upheld.  The following facilities have been contacted to seek placement for this pt, with results as noted:  Beds available, information sent, decision pending:  [facility]   At capacity:  [facility] (no male beds) [facility] (no male beds) [facility]    Doylene Canning, [place] [employer] Health Coordinator [phone]

## 2018-08-02 NOTE — ED Provider Notes (Addendum)
  Physical Exam  BP (!) 130/91   Pulse (!) 106   Temp 98.1 F (36.7 C) (Oral)   Resp 16   SpO2 94%   Physical Exam  ED Course/Procedures     Procedures  MDM  Assuming care of patient from Dr. Anitra Lauth.   Patient in the ED from independent living. It seems like patient gets close care and f/u by ACT team. Wife reports that pt has had a slow decline in his cognition and mentation over the past few weeks. Workup thus far shows urinary retention - for which foley catheter placed.  Concerning findings are as following none. Important pending results are BH and TTS evaluation.  According to Dr. Tanna Savoy, plan is to f/u on psych evaluation.   Patient had no complains, no concerns from the nursing side. Will continue to monitor.  Pt has noted to be tachycardic. HR has improved. Will monitor closely.   Wife has been very helpful in providing meaningful history, and it seems like patients decline is related to his underlying psychiatric diagnoses. Pt has also not been taking his meds.  Pt will need to keep foley in for now and will need a follow up with Urology.       Derwood Kaplan, MD 08/02/18 7414    Derwood Kaplan, MD 08/02/18 0030

## 2018-08-03 LAB — CBG MONITORING, ED
Glucose-Capillary: 74 mg/dL (ref 70–99)
Glucose-Capillary: 123 mg/dL — ABNORMAL HIGH (ref 70–99)
Glucose-Capillary: 124 mg/dL — ABNORMAL HIGH (ref 70–99)
Glucose-Capillary: 162 mg/dL — ABNORMAL HIGH (ref 70–99)
Glucose-Capillary: 97 mg/dL (ref 70–99)

## 2018-08-03 NOTE — Progress Notes (Addendum)
CSW aware patient is now psychiatrically cleared and ready for discharge. CSW aware patient is active with [facility] ACT Team but that they are unable to pick up patient. CSW attempted to reach out to patient's wife, Christian [name] [phone], regarding patient disposition and coordinate transportation. CSW left message for return call.   1:54pm- CSW received return call from wife Christian [name] and daughter Christian [name]. Family expressed frustration with patient's care. CSW validated family's feelings and informed them that we were looking into getting patient set up with Home Health Services. CSW informed by [facility], during phone conversation with family, that patient did not have any interest in [facility] services. CSW encouraged family to follow up with ACT team. CSW offered to send SNF list home with patient. Per family, patient would not be agreeable to go to a SNF. CSW to provide RN with SNF list to provide to family in the event that they may need it. Per family, they cannot come pick up patient until 5. CSW to update RN.  [name], LCSWA  Clinical Social Work Department  [employer]  [phone]

## 2018-08-03 NOTE — Progress Notes (Addendum)
6:55 PM Per daytime CSW handoff, pt's family was to come to the ED at 5 to review pt's AVS. Pt's family has not arrived. [facility] CSW called pt's spouse home number, [phone]. CSW spoke with pt's daughter, Christian [name]. CSW explained that pt has been psychiatrically and ready to return home. Pt's daughter stated she will reach out to her mother, pt's wife. CSW will continue to follow up with family. Once CSW receives confirmation that pt has the means to get into his home, pt will be sent home via PTAR. Pt told RN that he does not have a key.  CSW called and left HIPAA complaint voicemail for pt's wife, Christian [name], at [phone], stating that pt will be sent home from the ED via PTAR once entrance is secured. CSW provided return phone number if wife had questions.   CSW will continue to follow.   7:25 PM CSW reached out to pt's daughter, Christian [name]. CSW left HIPAA complaint voicemail for pt's daughter at [phone] asking for a return phone call.    7:55 PM CSW spoke with Christian [name] with pt's ACT Team. Christian [name] is reaching out to on call clinical person with ACT Team to have them reach out to family and/or to see if they are able to meet pt at apartment to ensure he gets inside. CSW waiting for call back.   Christian [name] from [employer] ACT Team at [phone] call CSW back. ACT Team reaching out to family to see if they are home to accept pt. ACT Team unable to reach family members. ACT Team does not have the means to get into pt's apartment.   CSW called [employer] to conduct Wellness Check.   Christian [name], [name]  [phone]

## 2018-08-03 NOTE — Consult Note (Signed)
[facility] Psych ED Discharge  08/03/2018 1:05 PM Christian [name]  MRN:  [id number] Principal Problem: Bipolar disorder, current episode depressed, severe, with psychotic features [facility]) Discharge Diagnoses:  Patient Active Problem List   Diagnosis Date Noted  . Encephalopathy [G93.40]   . Paroxysmal atrial fibrillation (HCC) [I48.0] 06/18/2018  . History of pulmonary embolism [Z86.711] 06/17/2018  . Generalized weakness [R53.1] 03/27/2018  . Diabetes mellitus type 2 in nonobese (HCC) [E11.9] 03/27/2018  . Pulmonary embolism (HCC) [I26.99] 03/27/2018  . Fall [W19.XXXA]   . Laceration of eyebrow [S01.119A]   . Bipolar I disorder, most recent episode depressed, severe w psychosis (HCC) [F31.5] 12/22/2015  . Bipolar affective disorder, depressed, severe, with psychotic behavior (HCC) [F31.5]   . Bipolar I disorder, current or most recent episode depressed, with psychotic features (HCC) [F31.5]   . Bipolar I disorder, most recent episode depressed (HCC) [F31.30]   . Severe bipolar I disorder with depression (HCC) [F31.4]   . Bipolar I disorder, most recent episode depressed, severe with psychotic features (HCC) [F31.5]   . Overdose [T50.901A] 09/11/2015  . Acute respiratory failure with hypoxia (HCC) [J96.01] 09/05/2015  . Elevated troponin [R74.8] 09/05/2015  . Somnolence [R40.0] 09/05/2015  . Bipolar depression (HCC) [F31.9] 09/05/2015  . Acute bilateral deep vein thrombosis (DVT) of femoral veins (HCC) [I82.413] 09/05/2015  . Bilateral pulmonary embolism (HCC) [I26.99] 09/02/2015  . Labile hypertension [R09.89]   . Pulmonary embolism with acute cor pulmonale (HCC) [I26.09]   . Dyspnea [R06.00]   . Orthostatic hypotension [I95.1]   . Bipolar disorder, in partial remission, most recent episode depressed (HCC) [F31.75]   . Syncope, near [R55] 08/31/2015  . Affective psychosis, bipolar (HCC) [F31.9]   . Bipolar disorder, now depressed (HCC) [F31.30] 08/28/2015  . Bipolar I disorder, severe,  current or most recent episode depressed, with psychotic features (HCC) [F31.5]   . Pressure ulcer [L89.90] 07/30/2015  . Protein-calorie malnutrition, severe (HCC) [E43] 07/30/2015  . Dehydration [E86.0] 07/29/2015  . Bipolar disorder, current episode depressed, severe, with psychotic features (HCC) [F31.5] 07/09/2015  . Diabetes (HCC) [E11.9] 07/09/2015  . UTI (urinary tract infection) [N39.0] 06/19/2014  . Positive blood culture [R78.81] 06/19/2014  . Bipolar affective disorder, current episode manic with psychotic symptoms (HCC) [F31.2] 06/17/2014  . Type 2 diabetes mellitus with complication, without long-term current use of insulin (HCC) [E11.8] 06/02/2014  . Other and unspecified hyperlipidemia [E78.5] 06/02/2014  . 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 Gout [M10.9] 06/02/2014  . Hypertension [I10]     Subjective: Pt was seen and chart reviewed with treatment team and Dr Lucianne Muss. Pt denied suicidal and homicidal ideation and does not appear to be responding to internal stimuli. Pt is slow to answer questions but is A&O x 4. Pt stated his wife's name and also that he lives with his wife. Pt's UDS and BAL are negative. Pt's depression has worsened and according to his wife, through chart review, Pt is not taking care of his personal hygiene. Pt has an ACT team through [facility] and would also benefit from home health assistance for his ADL's. Nurse case management consult entered to assist family with obtaining home health care. Pt is stable and psychiatrically clear for discharge. Social work to assist with transportation back home as Pt's ACT team is unable to pick him up today.   Total Time spent with patient: 30 minutes  Past Psychiatric History: As above   Past Medical History:  Past Medical  History:  Diagnosis Date  . 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 Bipolar 1 disorder  (HCC)   . Bipolar disorder (HCC)   . Chronic kidney disease   . Gout   . Hx of blood clots   . Hypertension   . Mood swings   . OSA on CPAP 06/02/2014  . Other and unspecified hyperlipidemia 06/02/2014  . Type II or unspecified type diabetes mellitus with unspecified complication, uncontrolled 06/02/2014   History reviewed. No pertinent surgical history. Family History:  Family History  Problem Relation Age of Onset  . Dementia Mother   . Arthritis Father   . Hypertension Father   . Mental illness Sister   . Diabetes Sister   . Heart failure Neg Hx   . Kidney failure Neg Hx   . Cancer Neg Hx    Family Psychiatric  History: Unknown Social History:  Social History   Substance and Sexual Activity  Alcohol Use No    Social History   Substance and Sexual Activity  Drug Use No   Social History   Socioeconomic History  . Marital status: Divorced    Spouse name: Not on file  . Number of children: Not on file  . Years of education: Not on file  . Highest education level: Not on file  Occupational History  . Not on file  Social Needs  . Financial resource strain: Not on file  . Food insecurity:    Worry: Not on file    Inability: Not on file  . Transportation needs:    Medical: Not on file    Non-medical: Not on file  Tobacco Use  . Smoking status: Never Smoker  . Smokeless tobacco: Never Used  Substance and Sexual Activity  . Alcohol use: No  . Drug use: No  . Sexual activity: Not Currently  Lifestyle  . Physical activity:    Days per week: Not on file    Minutes per session: Not on file  . Stress: Not on file  Relationships  . Social connections:    Talks on phone: Not on file    Gets together: Not on file    Attends religious service: Not on file    Active member of club or organization: Not on file    Attends meetings of clubs or organizations: Not on file    Relationship status: Not on file  Other Topics Concern  . Not on file  Social History Narrative    *The patient has been married for over 30 years and is currently on disability. He lives with his wife in the [place] area and has one daughter.              Has this patient used any form of tobacco in the last 30 days? (Cigarettes, Smokeless Tobacco, Cigars, and/or Pipes) Prescription not provided because: Pt does not smoke  Current Medications: Current Facility-Administered Medications  Medication Dose Route Frequency Provider Last Rate Last Dose  . amLODipine (NORVASC) tablet 10 mg  10 mg Oral Napoleon Form, Alphonzo Lemmings, MD   10 mg at 08/03/18 0757  . atorvastatin (LIPITOR) tablet 10 mg  10 mg Oral Daily Gwyneth Sprout, MD   10 mg at 08/03/18 0936  . buPROPion (WELLBUTRIN XL) 24 hr tablet 150 mg  150 mg Oral Daily Gwyneth Sprout, MD   150 mg at 08/03/18 0937  . cloNIDine (CATAPRES) tablet 0.2 mg  0.2 mg Oral BID Gwyneth Sprout, MD  0.2 mg at 08/03/18 0937  . divalproex (DEPAKOTE) DR tablet 1,000 mg  1,000 mg Oral QHS Plunkett, Whitney, MD   1,000 mg at 08/02/18 2122  . furosemide (LASIX) tablet 20 mg  20 mg Oral Daily Gwyneth Sprout, MD   20 mg at 08/03/18 0937  . hydrochlorothiazide (MICROZIDE) capsule 12.5 mg  12.5 mg Oral Daily Gwyneth Sprout, MD   12.5 mg at 08/03/18 0936  . insulin aspart (novoLOG) injection 0-15 Units  0-15 Units Subcutaneous TID WC Gwyneth Sprout, MD   2 Units at 08/03/18 1303  . insulin aspart (novoLOG) injection 0-5 Units  0-5 Units Subcutaneous QHS Gwyneth Sprout, MD   2 Units at 08/02/18 2125  . metFORMIN (GLUCOPHAGE-XR) 24 hr tablet 1,000 mg  1,000 mg Oral Q supper Gwyneth Sprout, MD   1,000 mg at 08/02/18 1822  . senna (SENOKOT) tablet 8.6 mg  1 tablet Oral BID Gwyneth Sprout, MD   8.6 mg at 08/03/18 1610   Current Outpatient Medications  Medication Sig Dispense Refill  . amLODipine (NORVASC) 10 MG tablet Take 10 mg by mouth every morning.     Marland Kitchen atorvastatin (LIPITOR) 10 MG tablet Take 10 mg by mouth every morning.    Marland Kitchen buPROPion  (WELLBUTRIN XL) 150 MG 24 hr tablet Take 1 tablet (150 mg total) by mouth daily. 30 tablet 0  . cloNIDine (CATAPRES) 0.2 MG tablet Take 0.2 mg by mouth 2 (two) times daily.    . divalproex (DEPAKOTE) 500 MG DR tablet Take 1,000 mg by mouth at bedtime.    Marland Kitchen FLUoxetine (PROZAC) 20 MG capsule Take 20 mg by mouth daily.    . furosemide (LASIX) 20 MG tablet Take 20 mg by mouth daily.    . hydrochlorothiazide (MICROZIDE) 12.5 MG capsule Take 1 capsule (12.5 mg total) by mouth daily. 30 capsule 0  . metFORMIN (GLUCOPHAGE-XR) 500 MG 24 hr tablet Take 1,000 mg by mouth daily.    Marland Kitchen senna (SENOKOT) 8.6 MG tablet Take 1 tablet by mouth 2 (two) times daily.    Marland Kitchen acetaminophen (TYLENOL) 325 MG tablet Take 2 tablets (650 mg total) by mouth every 6 (six) hours as needed for mild pain (or Fever >/= 101). (Patient not taking: Reported on 08/01/2018)    . docusate sodium (COLACE) 100 MG capsule Take 1 capsule (100 mg total) by mouth 2 (two) times daily. (Patient not taking: Reported on 08/01/2018) 10 capsule 0  . insulin aspart (NOVOLOG) 100 UNIT/ML injection Inject 0-9 Units into the skin 3 (three) times daily with meals. (Patient not taking: Reported on 08/01/2018) 10 mL 11  . rivaroxaban (XARELTO) 20 MG TABS tablet Take 1 tablet (20 mg total) by mouth daily with supper. (Patient not taking: Reported on 08/01/2018) 30 tablet 1    Musculoskeletal: Strength & Muscle Tone: within normal limits Gait & Station: normal Patient leans: N/A  Psychiatric Specialty Exam: Physical Exam  Constitutional: He is oriented to person, place, and time. He appears well-developed and well-nourished.  HENT:  Head: Normocephalic.  Respiratory: Effort normal.  Musculoskeletal: Normal range of motion.  Neurological: He is alert and oriented to person, place, and time.  Psychiatric: His speech is normal. Thought content normal. He is slowed. Cognition and memory are normal. He expresses impulsivity. He exhibits a depressed mood.    Review  of Systems  Psychiatric/Behavioral: Positive for depression. Negative for hallucinations, memory loss, substance abuse and suicidal ideas. The patient is not nervous/anxious and does not have insomnia.   All other  systems reviewed and are negative.   Blood pressure (!) 150/89, pulse 86, temperature 98.1 F (36.7 C), temperature source Oral, resp. rate 20, SpO2 99 %.There is no height or weight on file to calculate BMI.  General Appearance: Casual  Eye Contact:  Good  Speech:  Slow  Volume:  Decreased  Mood:  Depressed  Affect:  Congruent, Depressed and Flat  Thought Process:  Coherent and Descriptions of Associations: Intact  Orientation:  Full (Time, Place, and Person)  Thought Content:  Logical  Suicidal Thoughts:  No  Homicidal Thoughts:  No  Memory:  Immediate;   Good Recent;   Fair Remote;   Fair  Judgement:  Fair  Insight:  Fair  Psychomotor Activity:  Decreased  Concentration:  Concentration: Good and Attention Span: Good  Recall:  Good  Fund of Knowledge:  Fair  Language:  Good  Akathisia:  No  Handed:  Right  AIMS (if indicated):     Assets:  Architect Housing Social Support  ADL's:  Impaired  Cognition:  WNL  Sleep:        Demographic Factors:  Male and Unemployed  Loss Factors: Decline in physical health  Historical Factors: Impulsivity  Risk Reduction Factors:   Sense of responsibility to family and Living with another person, especially a relative  Continued Clinical Symptoms:  Bipolar Disorder:   Depressive phase Depression:   Impulsivity  Cognitive Features That Contribute To Risk:  Closed-mindedness and Loss of executive function    Suicide Risk:  Minimal: No identifiable suicidal ideation.  Patients presenting with no risk factors but with morbid ruminations; may be classified as minimal risk based on the severity of the depressive symptoms    Plan Of Care/Follow-up recommendations:  Activity:   as tolerated Diet:  Heart Healthy  Disposition: Take all medications as prescribed. Keep all follow-up appointments as scheduled.  Do not consume alcohol or use illegal drugs while on prescription medications. Report any adverse effects from your medications to your primary care provider promptly.  In the event of recurrent symptoms or worsening symptoms, call 911, a crisis hotline, or go to the nearest emergency department for evaluation.   [name], NP 08/03/2018, 1:05 PM

## 2018-08-03 NOTE — ED Notes (Signed)
Left message for wife r/t pick up time-family member stated she would be here a 5pm

## 2018-08-03 NOTE — Progress Notes (Signed)
[facility] CSW received phone call from [place] with [facility] ACT Team. Tresa Endo expressed concerns about pt being discharged home. Tresa Endo referenced conversation with staff yesterday that stated that he would be going to inpatient hospitalization. CSW explained that per psych team pt is no longer meeting inpatient criteria.   Montine Circle, [facility]  [phone]

## 2018-08-03 NOTE — BH Assessment (Signed)
[facility] Assessment Progress Note  Per [name], this pt does not require psychiatric hospitalization at this time.  Pt presents under IVC initiated by [name] with the [facility] ACT Team, which Dr Lucianne Muss initially upheld, but has now rescinded.  Pt is to be discharged from [facility] with recommendation to continue treatment with the [facility] ACT Team.  She also asks that I call pt's wife or the ACT Team to ask whether pt needs additional and to facilitate pt's discharge.  At 12:55 I called pt's wife, [name], at the phone number listed in pt's registration record, but received a message saying that the number is no longer in service.  At 12:56 I called [name] [phone]).  She reports that pt probably would benefit from home health services, and that the family may also want to have information for residential facilities for follow up at their own initiative.  She adds that the ACT Team is understaffed today, and will be unable to pick pt up.  [name], FNP agrees to place a consult request for Nurse Case Management to arrange for home health services.  [name], LCSW agrees to provide information about residential facilities for the benefit of the family, and to look into means of transporting pt back home.  Discharge instructions advise pt to continue treatment with the [facility] ACT Team.  Pt's nurse has been notified.  Doylene Canning, MA Triage Specialist [phone]

## 2018-08-03 NOTE — Discharge Instructions (Signed)
For your behavioral health needs, you are advised to continue treatment with the [facility] ACT Team: ° °     [facility] °     [address] °     [place], [place] [address] °     [phone] °

## 2018-08-03 NOTE — ED Notes (Signed)
Spoke with CSW about pt pending d/c; CSW was not aware of pt dementia dx; CSW will continue to reach out to family for pt to be d/c home.

## 2018-08-03 NOTE — ED Notes (Signed)
CBG prior to breakfast was 74. Pt encouraged to eat and drink. Post-breakfast CBG was 124. Pt is resting comfortably in bed. Will continue to monitor and encourage oral intake.

## 2018-08-04 ENCOUNTER — Encounter (HOSPITAL_COMMUNITY): Payer: Self-pay

## 2018-08-04 ENCOUNTER — Emergency Department (HOSPITAL_COMMUNITY)
Admission: EM | Admit: 2018-08-04 | Discharge: 2018-08-07 | Disposition: A | Discharge status: Home / Self Care | Payer: Medicare PPO | Source: Home / Self Care | Attending: Emergency Medicine | Admitting: Emergency Medicine

## 2018-08-04 ENCOUNTER — Other Ambulatory Visit: Payer: Self-pay

## 2018-08-04 DIAGNOSIS — R4182 Altered mental status, unspecified: Secondary | ICD-10-CM

## 2018-08-04 DIAGNOSIS — F3164 Bipolar disorder, current episode mixed, severe, with psychotic features: Secondary | ICD-10-CM

## 2018-08-04 LAB — CBG MONITORING, ED
Glucose-Capillary: 78 mg/dL (ref 70–99)
Glucose-Capillary: 160 mg/dL — ABNORMAL HIGH (ref 70–99)
Glucose-Capillary: 202 mg/dL — ABNORMAL HIGH (ref 70–99)

## 2018-08-04 MED ORDER — AMLODIPINE BESYLATE 5 MG PO TABS
10.0000 mg | ORAL_TABLET | ORAL | Status: DC
  Administered 2018-08-05 – 2018-08-07 (×3): 10 mg via ORAL
  Filled 2018-08-04 (×4): qty 2

## 2018-08-04 MED ORDER — FUROSEMIDE 20 MG PO TABS
20.0000 mg | ORAL_TABLET | Freq: Every day | ORAL | Status: DC
  Administered 2018-08-04 – 2018-08-06 (×3): 20 mg via ORAL
  Filled 2018-08-04 (×4): qty 1

## 2018-08-04 MED ORDER — METFORMIN HCL ER 500 MG PO TB24
1000.0000 mg | ORAL_TABLET | Freq: Every day | ORAL | Status: DC
  Administered 2018-08-05 – 2018-08-06 (×2): 1000 mg via ORAL
  Filled 2018-08-04 (×4): qty 2

## 2018-08-04 MED ORDER — FLUOXETINE HCL 20 MG PO CAPS
20.0000 mg | ORAL_CAPSULE | Freq: Every day | ORAL | Status: DC
  Administered 2018-08-04 – 2018-08-06 (×3): 20 mg via ORAL
  Filled 2018-08-04 (×4): qty 1

## 2018-08-04 MED ORDER — DIVALPROEX SODIUM 500 MG PO DR TAB
1000.0000 mg | DELAYED_RELEASE_TABLET | Freq: Every day | ORAL | Status: DC
  Administered 2018-08-04 – 2018-08-06 (×3): 1000 mg via ORAL
  Filled 2018-08-04 (×3): qty 2

## 2018-08-04 MED ORDER — ATORVASTATIN CALCIUM 10 MG PO TABS
10.0000 mg | ORAL_TABLET | ORAL | Status: DC
  Administered 2018-08-05 – 2018-08-07 (×3): 10 mg via ORAL
  Filled 2018-08-04 (×4): qty 1

## 2018-08-04 MED ORDER — ACETAMINOPHEN 325 MG PO TABS: 650.0000 mg | ORAL_TABLET | Freq: Four times a day (QID) | ORAL | Status: DC | PRN

## 2018-08-04 MED ORDER — INSULIN ASPART 100 UNIT/ML ~~LOC~~ SOLN
0.0000 [IU] | Freq: Three times a day (TID) | SUBCUTANEOUS | Status: DC
  Administered 2018-08-05: 5 [IU] via SUBCUTANEOUS
  Administered 2018-08-05: 2 [IU] via SUBCUTANEOUS
  Administered 2018-08-05 – 2018-08-06 (×3): 3 [IU] via SUBCUTANEOUS
  Filled 2018-08-04 (×5): qty 1

## 2018-08-04 MED ORDER — SENNA 8.6 MG PO TABS
1.0000 | ORAL_TABLET | Freq: Two times a day (BID) | ORAL | Status: DC
  Administered 2018-08-04 – 2018-08-06 (×5): 8.6 mg via ORAL
  Filled 2018-08-04 (×6): qty 1

## 2018-08-04 MED ORDER — HYDROCHLOROTHIAZIDE 12.5 MG PO CAPS
12.5000 mg | ORAL_CAPSULE | Freq: Every day | ORAL | Status: DC
  Administered 2018-08-05 – 2018-08-06 (×2): 12.5 mg via ORAL
  Filled 2018-08-04 (×3): qty 1

## 2018-08-04 MED ORDER — RIVAROXABAN 20 MG PO TABS
20.0000 mg | ORAL_TABLET | Freq: Every day | ORAL | Status: DC
  Administered 2018-08-05 – 2018-08-06 (×2): 20 mg via ORAL
  Filled 2018-08-04 (×3): qty 1

## 2018-08-04 MED ORDER — CLONIDINE HCL 0.1 MG PO TABS
0.2000 mg | ORAL_TABLET | Freq: Two times a day (BID) | ORAL | Status: DC
  Administered 2018-08-04 – 2018-08-06 (×5): 0.2 mg via ORAL
  Filled 2018-08-04 (×6): qty 2

## 2018-08-04 NOTE — ED Notes (Signed)
Pt stated "They took me to my wife's house because she said I could come there.  I'm really homeless.  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  I really shouldn't be here but I acted out when the ambulance took me out there and they brought me back here.  I told them to let me out at the hotel."

## 2018-08-04 NOTE — ED Notes (Signed)
Dr Ranae Palms made aware of pt's return

## 2018-08-04 NOTE — ED Notes (Addendum)
Pt removed condom catheter, linens changed, pt cleaned of incontinent stool.  Pt with 2-3 areas of breakdown on each buttock.  Barrier cream applied.  Pt continues scratching right antecubital area, is reddened.

## 2018-08-04 NOTE — ED Notes (Signed)
Spoke with pharmacy to confirm if pt received daily meds prior to d/c earlier today.  Was informed meds were verified but not dispensed.

## 2018-08-04 NOTE — Progress Notes (Signed)
CSW aware patient is back in ED. Per notes, once patient got to his ILF he became combative and refused to get off stretcher so was brought back to ED. CSW has attempted to reach out to wife, Gloriajean Dell [phone], for the third time today and left voicemail for return call. CSW also reached out to daughter, [name] [phone], again and left voicemail for return call.   CSW spoke with patient at bedside who stated he "needs to get out of here". When questioned where he could go he says he has no where. CSW inquired about what patient wanted and patient stated he's "ran out of options" and put "everyone in a bad place". CSW asked patient if he was able to ambulate and take care of himself to which he said he couldn't.  CSW spoke with Tresa Endo, therapist with [facility], who reported that patient has been with them since 11/18 after spending 2 years at [facility]. Per Tresa Endo, patient won't eat, won't take his medications and won't bathe without someone there to help. Tresa Endo stated that he has been declining for a while but it just got this bad about 3 weeks ago. Per Tresa Endo, she thinks it's a combination of his age, his bipolar diagnosis and his situation with his wife. Patient and wife just recently divorced.  CSW staffed with [facility] regarding patient disposition. Per AD, since patient does not have a need for rehabilitation his insurance would not cover for SNF placement. Additionally, if patient is looking for long-term placement he would need Medicaid or to pay out of pocket. At this time, patient is his own legal guardian and not agreeable to go to SNF. Patient stated "that is not an option".  CSW to leave handoff for 2nd shift ED CSW.   [name], LCSWA  Clinical Social Work Department  [employer]  [phone]

## 2018-08-04 NOTE — ED Provider Notes (Signed)
He was combative with [facility]EMERGENCY DEPT Provider Note   CSN: [id number] Arrival date & time: 08/04/18  1200     History   Chief Complaint Chief Complaint  Patient presents with  . Aggressive Behavior    HPI Christian [name] is a 67 y.o. male.  HPI Patient was just discharged this morning.  He was transported by [employer].  Supposedly combative in route.  Refused to get up off stretcher.  Brought back to the emergency department.  Patient denies any complaints currently. Past Medical History:  Diagnosis Date  . 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 Bipolar 1 disorder (HCC)   . Bipolar disorder (HCC)   . Chronic kidney disease   . Gout   . Hx of blood clots   . Hypertension   . Mood swings   . OSA on CPAP 06/02/2014  . Other and unspecified hyperlipidemia 06/02/2014  . Type II or unspecified type diabetes mellitus with unspecified complication, uncontrolled 06/02/2014    Patient Active Problem List   Diagnosis Date Noted  . Encephalopathy   . Paroxysmal atrial fibrillation (HCC) 06/18/2018  . History of pulmonary embolism 06/17/2018  . Generalized weakness 03/27/2018  . Diabetes mellitus type 2 in nonobese (HCC) 03/27/2018  . Pulmonary embolism (HCC) 03/27/2018  . Fall   . Laceration of eyebrow   . Bipolar I disorder, most recent episode depressed, severe w psychosis (HCC) 12/22/2015  . Bipolar affective disorder, depressed, severe, with psychotic behavior (HCC)   . Bipolar I disorder, current or most recent episode depressed, with psychotic features (HCC)   . Bipolar I disorder, most recent episode depressed (HCC)   . Severe bipolar I disorder with depression (HCC)   . Bipolar I disorder, most recent episode depressed, severe with psychotic features (HCC)   . Overdose 09/11/2015  . Acute respiratory failure with hypoxia (HCC) 09/05/2015  . Elevated troponin 09/05/2015  . Somnolence 09/05/2015  . Bipolar  depression (HCC) 09/05/2015  . Acute bilateral deep vein thrombosis (DVT) of femoral veins (HCC) 09/05/2015  . Bilateral pulmonary embolism (HCC) 09/02/2015  . Labile hypertension   . Pulmonary embolism with acute cor pulmonale (HCC)   . Dyspnea   . Orthostatic hypotension   . Bipolar disorder, in partial remission, most recent episode depressed (HCC)   . Syncope, near 08/31/2015  . Affective psychosis, bipolar (HCC)   . Bipolar disorder, now depressed (HCC) 08/28/2015  . Bipolar I disorder, severe, current or most recent episode depressed, with psychotic features (HCC)   . Pressure ulcer 07/30/2015  . Protein-calorie malnutrition, severe (HCC) 07/30/2015  . Dehydration 07/29/2015  . Bipolar disorder, current episode depressed, severe, with psychotic features (HCC) 07/09/2015  . Diabetes (HCC) 07/09/2015  . UTI (urinary tract infection) 06/19/2014  . Positive blood culture 06/19/2014  . Bipolar affective disorder, current episode manic with psychotic symptoms (HCC) 06/17/2014  . Type 2 diabetes mellitus with complication, without long-term current use of insulin (HCC) 06/02/2014  . Other and unspecified hyperlipidemia 06/02/2014  . 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 Gout 06/02/2014  . Hypertension     History reviewed. No pertinent surgical history.      Home Medications    Prior to Admission medications   Medication Sig Start Date End Date Taking? Authorizing Provider  acetaminophen (TYLENOL) 325 MG tablet Take 2 tablets (650 mg total) by mouth every 6 (six) hours as needed for mild pain (or Fever >/=  101). Patient not taking: Reported on 08/01/2018 06/23/18   Alwyn Ren, MD  amLODipine (NORVASC) 10 MG tablet Take 10 mg by mouth every morning.     [provider]  atorvastatin (LIPITOR) 10 MG tablet Take 10 mg by mouth every morning.    [provider]  buPROPion (WELLBUTRIN XL) 150 MG 24 hr tablet Take 1  tablet (150 mg total) by mouth daily. Patient not taking: Reported on 08/04/2018 06/24/18   Alwyn Ren, MD  cloNIDine (CATAPRES) 0.2 MG tablet Take 0.2 mg by mouth 2 (two) times daily.    [provider]  divalproex (DEPAKOTE) 500 MG DR tablet Take 1,000 mg by mouth at bedtime.    [provider]  docusate sodium (COLACE) 100 MG capsule Take 1 capsule (100 mg total) by mouth 2 (two) times daily. Patient not taking: Reported on 08/01/2018 06/23/18   Alwyn Ren, MD  FLUoxetine (PROZAC) 20 MG capsule Take 20 mg by mouth daily.    [provider]  furosemide (LASIX) 20 MG tablet Take 20 mg by mouth daily.    [provider]  hydrochlorothiazide (MICROZIDE) 12.5 MG capsule Take 1 capsule (12.5 mg total) by mouth daily. Patient not taking: Reported on 08/04/2018 06/24/18   Alwyn Ren, MD  insulin aspart (NOVOLOG) 100 UNIT/ML injection Inject 0-9 Units into the skin 3 (three) times daily with meals. Patient not taking: Reported on 08/01/2018 06/23/18   Alwyn Ren, MD  metFORMIN (GLUCOPHAGE-XR) 500 MG 24 hr tablet Take 1,000 mg by mouth daily.    [provider]  rivaroxaban (XARELTO) 20 MG TABS tablet Take 1 tablet (20 mg total) by mouth daily with supper. Patient not taking: Reported on 08/01/2018 06/23/18   Alwyn Ren, MD  senna (SENOKOT) 8.6 MG tablet Take 1 tablet by mouth 2 (two) times daily.    [provider]    Family History Family History  Problem Relation Age of Onset  . Dementia Mother   . Arthritis Father   . Hypertension Father   . Mental illness Sister   . Diabetes Sister   . Heart failure Neg Hx   . Kidney failure Neg Hx   . Cancer Neg Hx     Social History Social History   Tobacco Use  . Smoking status: Never Smoker  . Smokeless tobacco: Never Used  Substance Use Topics  . Alcohol use: No  . Drug use: No     Allergies   Zyprexa [olanzapine]   Review of Systems Review of  Systems  Constitutional: Negative for fever.  Respiratory: Negative for shortness of breath.   Cardiovascular: Negative for chest pain and leg swelling.  Gastrointestinal: Negative for abdominal pain, diarrhea, nausea and vomiting.  Musculoskeletal: Negative for back pain and myalgias.  Skin: Negative for rash and wound.  Neurological: Negative for weakness and numbness.  Psychiatric/Behavioral: Positive for behavioral problems.  All other systems reviewed and are negative.    Physical Exam Updated Vital Signs BP (!) 164/88 (BP Location: Left Arm)   Pulse (!) 109   Temp 98.4 F (36.9 C) (Oral)   Resp 16   Wt 103.5 kg   SpO2 98%   BMI 30.95 kg/m   Physical Exam  Constitutional: He is oriented to person, place, and time. He appears well-developed and well-nourished.  HENT:  Head: Normocephalic and atraumatic.  Mouth/Throat: Oropharynx is clear and moist. No oropharyngeal exudate.  Eyes: Pupils are equal, round, and reactive to light.  EOM are normal.  Neck: Normal range of motion. Neck supple.  No meningismus  Cardiovascular: Normal rate and regular rhythm. Exam reveals no gallop and no friction rub.  No murmur heard. Pulmonary/Chest: Effort normal and breath sounds normal. No stridor. No respiratory distress. He has no wheezes. He has no rales. He exhibits no tenderness.  Abdominal: Soft. Bowel sounds are normal. There is no tenderness. There is no rebound and no guarding.  Musculoskeletal: Normal range of motion. He exhibits no edema or tenderness.  Neurological: He is alert and oriented to person, place, and time.  Moving all extremities without focal deficit.  Sensation intact.  Skin: Skin is warm and dry. No rash noted. No erythema.  Psychiatric: He has a normal mood and affect. His behavior is normal.  Calm, cooperative  Nursing note and vitals reviewed.    ED Treatments / Results  Labs (all labs ordered are listed, but only abnormal results are displayed) Labs  Reviewed  CBG MONITORING, ED - Abnormal; Notable for the following components:      Result Value   Glucose-Capillary 160 (*)    All other components within normal limits  CBG MONITORING, ED - Abnormal; Notable for the following components:   Glucose-Capillary 202 (*)    All other components within normal limits  CBG MONITORING, ED - Abnormal; Notable for the following components:   Glucose-Capillary 126 (*)    All other components within normal limits  CBG MONITORING, ED - Abnormal; Notable for the following components:   Glucose-Capillary 170 (*)    All other components within normal limits  CBG MONITORING, ED - Abnormal; Notable for the following components:   Glucose-Capillary 225 (*)    All other components within normal limits  CBG MONITORING, ED - Abnormal; Notable for the following components:   Glucose-Capillary 105 (*)    All other components within normal limits  CBG MONITORING, ED - Abnormal; Notable for the following components:   Glucose-Capillary 172 (*)    All other components within normal limits  CBG MONITORING, ED - Abnormal; Notable for the following components:   Glucose-Capillary 155 (*)    All other components within normal limits  CBG MONITORING, ED - Abnormal; Notable for the following components:   Glucose-Capillary 109 (*)    All other components within normal limits    EKG None  Radiology No results found.  Procedures Procedures (including critical care time)  Medications Ordered in ED Medications  acetaminophen (TYLENOL) tablet 650 mg (has no administration in time range)  amLODipine (NORVASC) tablet 10 mg (10 mg Oral Given 08/07/18 0611)  atorvastatin (LIPITOR) tablet 10 mg (10 mg Oral Given 08/07/18 0611)  cloNIDine (CATAPRES) tablet 0.2 mg (0.2 mg Oral Not Given 08/07/18 1029)  divalproex (DEPAKOTE) DR tablet 1,000 mg (1,000 mg Oral Given 08/06/18 2128)  FLUoxetine (PROZAC) capsule 20 mg (20 mg Oral Not Given 08/07/18 1027)  furosemide (LASIX)  tablet 20 mg (20 mg Oral Not Given 08/07/18 1027)  hydrochlorothiazide (MICROZIDE) capsule 12.5 mg (12.5 mg Oral Not Given 08/07/18 1027)  metFORMIN (GLUCOPHAGE-XR) 24 hr tablet 1,000 mg (1,000 mg Oral Not Given 08/07/18 0809)  rivaroxaban (XARELTO) tablet 20 mg (20 mg Oral Not Given 08/07/18 1743)  senna (SENOKOT) tablet 8.6 mg (8.6 mg Oral Not Given 08/07/18 1027)  insulin aspart (novoLOG) injection 0-15 Units (0 Units Subcutaneous Not Given 08/07/18 1742)     Initial Impression / Assessment and Plan / ED Course  I have reviewed the triage vital signs and  the nursing notes.  Pertinent labs & imaging results that were available during my care of the patient were reviewed by me and considered in my medical decision making (see chart for details).     Social work to work on placement for the patient.  Final Clinical Impressions(s) / ED Diagnoses   Final diagnoses:  Altered mental status, unspecified altered mental status type  Bipolar disorder, current episode mixed, severe, with psychotic features [facility])    ED Discharge Orders    None       Loren Racer, MD 08/07/18 2315

## 2018-08-04 NOTE — ED Triage Notes (Addendum)
Pt brought back by PTAR from [facility]. Pt was combative en route with PTAR. Pt refused to get off stretcher, and wife refused patient, as well as ACT team (Mr. Urbano Heir). Pt was swinging at EMS personnel en route.

## 2018-08-04 NOTE — Progress Notes (Addendum)
CSW received handoff from 2nd shift [facility] ED CSW covering for [place]. CSW aware family never came for patient. CSW also aware that patient does not have a key into his home. CSW attempted to reach out to wife, Rage Beever [phone]. First attempt was answered but no one would talk. Second attempt went to voicemail. CSW left voicemail for return call. CSW also attempted to contact daughter, Sharl Ma [phone], and left voicemail for return call. CSW to reach out to [facility] ACT team to assist with getting patient home.  7:53am- CSW spoke to staff with [facility] ACT team who is agreeable to assist with getting patient into his apartment. Per staff, they would meet patient at his apartment once PTAR is called and work with the landlord to get patient back into his home. CSW informed that staff has a meeting until 10am and requested that [place] not be called until then. CSW to update RN.   9:22am- CSW received call from Dr. Arvilla Market with [facility] regarding patient disposition plan. Per Dr. Arvilla Market, he would like to have patient receive ECT. CSW explained that patient has already been discharged and is awaiting PTAR at 10. CSW also explained that psychiatry team did not feel patient met admission criteria. Dr. Arvilla Market expressed understanding but requested that the attending psychiatrist give him a call at [phone]. CSW to relay message to attending psychiatrist that discharged patient yesterday.   Patient is still to be discharged and taken back to his apartment via [facility]. Midwife has agreed to meet patient at home to assist with patient getting into his apartment. CSW has updated RN who will call for PTAR.  Ollen Barges, [place] Work Department  [employer]  [phone]

## 2018-08-04 NOTE — ED Notes (Signed)
Patient swinging at staff refusing all vitals.

## 2018-08-04 NOTE — Care Management Note (Addendum)
CM consulted for [facility] services.  CM spoke with pt at bedside who refused [facility] services reporting, "It won't help."  CM updated [facility], NP and CSW.  No further CM needs noted at this time.

## 2018-08-04 NOTE — Progress Notes (Addendum)
CSW had a missed call from and then CSW called pt's daughter Rj Starzynski at ph:  [phone]. CSW left a Hippa-compliant VM.  CSW will continue to follow for D/C needs.  4:41 PM CSW spoke to pt's daughter [name] who stated she could not understand why she was told the pt was to be referred for psychiatric inpatient treatment on one day and then was up for D/C the next.    CSW explained why the pt was appropriate one day and then was not appropriate for psyche inpatient the next.  Pt's daughter voiced understanding, but briefly reiterated the pt's history including his recent connection to [facility] following his approx 2 year stay at [facility].  Pt's daughter stated she has no answers as to how to motivate the pt to take care of himself, not how to get him to get up and walk out or allow himself to be successfully transported him.    Pt's daughter Ekamjot Boniface at ph:  [phone] requested that she be updated should any changes occur regarding pt or pt's disposition.  [name]. Elanna Bert, LCSW, LCAS, CSI Clinical Social Worker Ph: [phone]

## 2018-08-04 NOTE — ED Notes (Signed)
Per Dr Rhunette Croft, no oxygen needed, pts O2 sat 96 % on room air, no distress noted, no needs voiced

## 2018-08-04 NOTE — Progress Notes (Signed)
Pt's daughter [name] at ph:  [phone] requested that she be updated should any changes occur regarding pt or pt's disposition.  Please reconsult if future social work needs arise.  CSW signing off, as social work intervention is no longer needed.  [name]. [name], LCSW, LCAS, CSI Clinical Social Worker Ph: [phone]

## 2018-08-04 NOTE — ED Notes (Addendum)
Pt refusing to allow O2 sat checked.  Pt not wearing oxygen at this time. Pt has 67ml clear yellow urine output from foley cath.

## 2018-08-04 NOTE — ED Notes (Addendum)
.   Pt. refuse to be change from being incontinent X2  Pt. was combative push against [place] 6 staff member also Clinical research associate as we was doing his pericare and changing the linen on his bed

## 2018-08-04 NOTE — ED Notes (Addendum)
Pt has been calm and cooperative with staff tonight. No needs voiced.

## 2018-08-04 NOTE — ED Notes (Signed)
Bed: RF16 Expected date:  Expected time:  Means of arrival:  Comments: EMS 67yo pt brought back - combative- refused by facility, family

## 2018-08-04 NOTE — Progress Notes (Signed)
Patient refused to get out of bed to chair. Patient resisted when the nursing staff attempted to help him out of bed. Will continue to monitor patient.

## 2018-08-05 LAB — CBG MONITORING, ED
Glucose-Capillary: 126 mg/dL — ABNORMAL HIGH (ref 70–99)
Glucose-Capillary: 170 mg/dL — ABNORMAL HIGH (ref 70–99)
Glucose-Capillary: 225 mg/dL — ABNORMAL HIGH (ref 70–99)

## 2018-08-05 NOTE — Progress Notes (Signed)
CSW met with patient at bedside to discuss discharge planning. Patient made aware that he is psychiatrically and medically cleared for discharge.   Patient states he is unsure of where to go and states he cannot return to [facility]. When asked why, patient states "because I'm not a senior citizen," patient refuses to answer CSW when asked if he lived there previously.   Patient refused to answer most questions or engage with CSW. Patient would not endorse or deny reasons for not wanting to return to [facility]. Patient restates "I can't go back, I'm not a senior citizen."   Patient shares he has no plans for where to go post-discharge and states "but I can't be here, I gotta get out of here."   CSW attempted to reach the following contacts without success and left HIPPA compliant voicemails for each party:  Claiborne Billings from [facility] [place] Team at [phone] - to assist in discharge [place] [phone]) to determine if patient is allowed to return  Calven Gilkes, patient's daughter, [phone] to assist in discharge [place], patient's wife, [phone] to assist in discharge planning  CSW continuing to follow for discharge planning. Patient is appropriate for discharge.  Stephanie Acre, [place] Social Worker [phone]

## 2018-08-05 NOTE — ED Notes (Signed)
Blood clots noted in pt's condom cath & at the beginning of catheter tubing.  Dr. Eudelia Bunch informed.

## 2018-08-05 NOTE — ED Notes (Signed)
Pt. Will refuse everything you ask but sometime can be redirected to do  What is asked.

## 2018-08-05 NOTE — ED Notes (Signed)
Patient would not allow nurse to perform bladder scan.

## 2018-08-05 NOTE — ED Notes (Addendum)
Pt removed condom catheter, lac noted to penis, bleeding controlled @ present.  Dr. Eudelia Bunch notified.

## 2018-08-05 NOTE — ED Notes (Signed)
Pt stating "no" when staff attempting to clean of incontinent stool.  Questioned pt if he walked @ home.  He stated "No".  Then pt stated "I walk with a cane."  Then stated "I don't walk with a cane.  I should just go."

## 2018-08-06 LAB — CBG MONITORING, ED
Glucose-Capillary: 105 mg/dL — ABNORMAL HIGH (ref 70–99)
Glucose-Capillary: 172 mg/dL — ABNORMAL HIGH (ref 70–99)
Glucose-Capillary: 155 mg/dL — ABNORMAL HIGH (ref 70–99)

## 2018-08-06 NOTE — ED Provider Notes (Signed)
Pt has been in emergency department for 5 days, given concern for slow decline in cognition and mentation over weeks, had been medically cleared, psychiatrically cleared. Pt was not taking medications. SW was consulted after pt medically/psychiatrically cleared and have spoken with family and plan to discharge patient home. He does not have keys which will need to be sorted out prior to patient leaving ED.  Pt stable on my evaluation, reports "I should have been discharged a long time ago." Recommend outpt follow up. Transportation and plan with keys pending at this time.    Alvira Monday, MD 08/07/18 [phone]

## 2018-08-06 NOTE — Progress Notes (Signed)
CSW made aware patient is discharged.  CSW met with patient at bedside to inform patient and coordinate return to ILF.   Patient states "ya'll should have let me go along time ago," CSW explained some of the barriers to discharge were patient's lack of commitment to discharge plan.   Patient states he does not have a key to his apartment. Patient's ACT Team clinician, Claiborne Billings, endorsed this earlier. Claiborne Billings thinks the patient's family has a key. CSW has tried to reach patient's daughter and ex-wife by phone multiple times during the past two days.   When asked who might have a key to the patient's apartment, the patient says "no one," when asked if the main office would have the key the patient says "no," when asked if the door is unlocked, the patient says the door is locked.  CSW attempted to contact [facility]. The call was not answered but the voicemail provides the contact information for after hours maintenance staff. CSW contacted after hours maintenance in the hopes of locating an employee with a key. Maintenance took a message and stated they would make some phone calls and return a call to [place].  Patient is medically cleared, psychiatrically cleared, and discharged. Patient is able to transport back to [facility] where he is a current resident. The only barrier to discharge at this time is locating a family member or staff member at [facility] with a key to the patient's residence.   Stephanie Acre, [place] Social Worker [phone]

## 2018-08-06 NOTE — Progress Notes (Signed)
CSW spoke with Tresa Endo from [facility] ACT Team at [phone].  Tresa Endo states that the patient is allowed to return to [facility] and that he is not banned or evicted. Tresa Endo shared that the patient likely does not want to return as he is recently divorced and does not like living alone.   Tresa Endo believes that the patient likely does not have a key to his apartment, but family should have a key.  Tresa Endo informed of medical and psychiatric clearance and a patient likely discharging soon. No further questions for CSW.  CSW contacted EDP per Social Work A.D. recommendation to determine if patient is appropriate for discharge.   CSW following patient to assist with discharge planning. Patient will likely discharge back to [facility] and may require family contact to meet patient with key.  Enid Cutter, LCSW-A Clinical Social Worker [phone]

## 2018-08-06 NOTE — Progress Notes (Signed)
PTAR was called around 8 pm to transport patient back to [facility]. Around the same time wife called to check on ETA.  This RN asked to make sure that wife will be there to let PTAR in, which at the time she was waiting at the apartment. This RN called wife back to inform that Sharin Mons is here and will arrived to [facility] in 1 hour or less. However, wife said that she had left the facility and kept the door unlock.  PTAR talked to wife over the phone that they need her or any caregiver to sign paperwork. Wife said that she will have her daughter to meet PTAR to sign paperwork. During the the conversation, it was then discovered that patient will not have anyone to stay with him and patient is unable to do his own ADL. Per wife patient has not walked in past 2 weeks. They were just going to put him on a diaper and some one will check on him tomorrow evening.  Charge nurse Ilene informed and talked to [facility] 2 and decided that patient is not safe to discharge home in this condition.  Will keep patient over night and have SW consult in am. Patient and wife in informed of current status.

## 2018-08-06 NOTE — Progress Notes (Addendum)
[facility] maintenance called to inform CSW they could meet patient at his apartment at 7:15pm this evening with a key.  CSW contacted patient's nurse in TCU to coordinate discharge with PTAR.  [facility] Maintenance Contact: "Ricky" at  [phone]    Daughter, [name] called back. [name] not able to meet patient with key today, requested to delay discharge. CSW explained maintenance at [facility] is able to accommodate this evening. [name] requesting EMS situate patient in his recliner at home instead of in bed.   CSW signing off.  Enid Cutter, LCSW-A Clinical Social Worker [phone]

## 2018-08-06 NOTE — ED Notes (Signed)
Patient calm, cooperative. Helped with taking medications. Sitter at bedside for hygiene care.

## 2018-08-07 LAB — CBG MONITORING, ED: Glucose-Capillary: 109 mg/dL — ABNORMAL HIGH (ref 70–99)

## 2018-08-07 NOTE — ED Notes (Signed)
PTAR called for discharge transport 

## 2018-08-07 NOTE — ED Notes (Signed)
Refused all vitals.

## 2018-08-07 NOTE — ED Notes (Signed)
PTAR arrived to transport pt home. Called pts wife and ACT team member Microbiologist) to inform them of pt discharge. Both parties aware pt is on his way home. Court agreed to meet PTAR staff at residence.

## 2018-08-07 NOTE — Discharge Planning (Signed)
[facility] consulted regarding DME wheelchair for pt.  Pt does not qualify for wheelchair thorough insurance as he has received Rol[employer] within last 5 years.  Pt DOES have option to purchase wheelchair at local medical supply office.

## 2018-08-07 NOTE — Progress Notes (Addendum)
CSW spoke to Bartlett Librarian, academic with [employer] [facility] who is agreeable with being present at the pt's arriving at the pt's [facility]) as long as:  1. Facility management is agreeable to being present and having the door unlocked prior to the pt arriving and...  2. [facility] Lorrin Goodell at ph: [phone] being notified that the pt is departing from the [facility] ED by the RN.  CSW will meet with the pt to notify the pt D/C is imminent.  5:30 PM Court with [facility] states that [facility] spoke with Dr. Sheppard Evens with [facility] is willing to accept the pt to [facility]'s psychiatric inpatient treatment facility as a direct admit.  Court will follow up with Colletta Maryland at [employer] to attempt to get a room # and a # for report.  CSW met with the pt who refuses to be D/C'd home via PT[facility] and when asked why the pt stated, "Look   5:59 PM CSW faxed pt's behavioral health referral via the hub to [facility] assessment team fax # [phone].    CSW called the assessment team's number via the switchboard and left a HIPPA-compliant VM requesting a call back. CSW then called and asked to be connected to the ED CSW at [facility].  CSW spoke to the operator and asked to speak to the ED CWSW and got the VM again for the [facility] Assessment Team.  CSW called back and asked for the ED CN at [place] spoke to the ED CN who stated that the assessment team would call back shortly.  6:27 PM CSW received a call from [place] the ED psychiatric worker [facility]) at [place] ED Assessment team who stated they will review the pt's referral tonight, per Dr. Lina Sar instructions, and will call this writer back with an update ASAP on 08/07/18.   The Assessment Teams's direct line is: [phone] and the [facility] Operator is [phone] (Fax: [phone].  CSW will continue to follow for D/C needs.  Alphonse Guild.  Taevin Mcferran, LCSW, LCAS, CSI Clinical Social Worker Ph: [phone]

## 2018-08-07 NOTE — Progress Notes (Addendum)
CSW received handoff from Weekend CSW. CSW aware patient was to be discharged to [facility], where patient is a resident, via [place]. PTAR was to be met by a maintenance worker who would assist in getting patient into his home. Per notes, charge nurse and PTAR did not feel safe discharging patient home in his condition.  Per conversations with people involved in patient's care, patient can walk and care for himself but refuses to. Per notes, patient stated he walks with a cane. Patient is considered custodial care so his insurance would not cover for SNF placement so placement would have to be paid privately. Additionally, if patient is looking for long-term placement he would need Medicaid or to pay out of pocket. At this time, patient is his own legal guardian and not agreeable to go to SNF. Patient stated "that is not an option".  CSW spoke with ex-wife, Fish farm manager, for assistance with getting patient back home. Per [name], patient gets home health services at home through [facility] but refuses when they come out.   Patient remains psychiatrically and medically cleared. CSW to contact daughter, Sharl Ma, to assist in getting patient back into his home.   1:04pm- CSW informed that daughter is at work and unable to assist with getting patient into his home. CSW informed by ex-wife that she may be able to assist as long as we give her adequate notice. CSW spoke with patient at bedside who is now refusing to be transported via [place]. Patient has no other way to get home. Patient also stating he has no where to stay. CSW aware patient is able to return to [place]. CSW communicated this to patient who is now saying that won't work. CSW has updated Surveyor, quantity. CSW will continue to follow.  Ollen Barges, [place] Work Department  [employer]  [phone]

## 2018-08-07 NOTE — Progress Notes (Addendum)
CSW spoke to the CSW Asst Director who stated that the current plan of informing the pt that [facility] would be transporting the pt back to his home and seeking the assistance of the ex-wife and daughter who previously agreed to be present at the pt's home to both sign for the pt (from [facility]) and unlock the door using a needed code, is still to proceed.  CSW Asst Director stated that the ED Director is to be notified if pt still refuses and that an adult pt who is A&OX4 is refusing D/C to his independent living facility where family is to meet him.  CSW will seek to have the pt's daughter ensure that the pt's ex-wife is not in sight when the pt actually arrives as her presence, per the daughter, may upset the pt.  Per pt's daughter the pt's ex-wife divorced him "so he (the pt) could get the best of needed benefits", but pt nonetheless per the pt's daughter, still harbors resentment against his ex-wife.  Pt's ex-wife was present when the pt originally arrived at his home on the previous attempt to D/C the pt to his independent living facility.  Per the 1st shift ED CSW the pt was refusing to be D/C'd home or elsewhere via PT[facility] today (9/9).  4:50 PM CSW called and left HIPPA-compliant VM's requesting a call back from pt's:  Daughter Armed forces operational officer at ph: [phone] and pt's: Ex-wife (who is supportive and closely involved at ph: [phone]  CSW received a call from pt's wife stating she can arrange for the facility to open the door for the pt and the ACTT team when they arrive with PTAR.  CSW will continue to follow for D/C needs.  [name]. Linnie Delgrande, LCSW, LCAS, CSI Clinical Social Worker Ph: [phone]

## 2018-08-07 NOTE — Progress Notes (Signed)
08/07/18  1800  Patient has refused vitals signs, blood sugar checks, and medicines today.

## 2018-08-07 NOTE — Progress Notes (Addendum)
CSW spoke to the CSW Asst Director who stated CSW spoke to the pt again and stated the original plan for the pt to be D/C'd home tonight is still in effect.  Pt presented as A&OX4 and stated, "No, I can't go there" and CSW stated that unfortunately the pt was still discharged and that the pt could no longer remain at the hospital.  Pt did not agree but did not say anthing and instead presented as agitated.  CSW repeated this and pt voiced understanding but stated he was not agreeable and said, "No".  CSW stated he understood the pt did not prefer to go home, but that the pt was D/C'd and the pt would be met by his ACTT team member who's name is Court and pt stated, "I know, a little small guy, he was there last time ya'll sent me home".  The CSW confirmed that and stated, "He will be there again tonight to take your papers for you from PTAR and pt presented as agitated, but said nothing.   The RN will need to call the pt's ex-wife Darald Uzzle at ph: [phone] when PT[facility] arrives to take to pt home.  RN will also need to call the pt's ACTT team member Court at ph: [phone].  RN updated.    CSW spoke to the pt's wife who started pt lives in at the facility and is supposed to be independent and although she can't remain there tonight with the pt post-D/C. the pt "is supposed to be able to live there alone", per the pt's ex-wife.  Pt's wife stated pt can walk but for some reason acts as if he can't and that she is at a loss to determine how to make him decide to walk again.  CSW validated pt's ex-wife's feelings and provided active listening.  Pt's wife is awaiting the call from the pt's RN stating that pt is leaving with PTAR. Once the pt's wife is aware the pt is on his way home via PTAR she will call the facility to have them open the pt's [place] door.  CSW called pt's ACTT team member Court at ph: [phone] and informed him that since at this time the pt is psychiatrically cleared the  pt is not appropriate, per leadership, for a psychiatric referral to another hospital and since the pt is psychiatrically cleared the pt will be D/C'd back to his home, per   Pt's ACTT team member Court at ph: [phone] is awaiting the call from the pt's RN stating PT[facility] has arrived and is taking the pt home. Once Court is notified he will go to the pt's home and open the pt's hall door using the code.  The pt's apartment address is:  [address] [place] [address]  The pt's code for his apartment [address] and PTAR needs to be made aware.  CSW will continue to follow for D/C needs.  Alphonse Guild. Amran Malter, LCSW, LCAS, CSI Clinical Social Worker Ph: [phone]

## 2018-08-07 NOTE — Progress Notes (Signed)
CSW called the pt's ACTT team member/worker Court who is aware that the pt's RN will be calling him at ph: [phone] and will be calling the pt's ex-wife Gloriajean Dell Fredericks at ph: [phone] to let them know the pt will be leaving the ED to go to his home at:  [address] [place]  [address]  The pt's code for his apartment [address] and PTAR needs to be made aware that the pt's wife will make sure the facility opens the pt's door with a key at that time or the pt's wife will go and unlock the pt's door herself.  RN/EDP updated.  CSW will continue to follow for D/C needs.  [name]. Yeraldin Litzenberger, LCSW, LCAS, CSI Clinical Social Worker Ph: [phone]

## 2018-08-11 ENCOUNTER — Encounter (HOSPITAL_COMMUNITY): Payer: Self-pay

## 2018-08-11 ENCOUNTER — Emergency Department (HOSPITAL_COMMUNITY)
Admission: EM | Admit: 2018-08-11 | Discharge: 2018-08-14 | Disposition: A | Discharge status: Home / Self Care | Payer: Medicare PPO | Attending: Emergency Medicine | Admitting: Emergency Medicine

## 2018-08-11 ENCOUNTER — Other Ambulatory Visit: Payer: Self-pay

## 2018-08-11 DIAGNOSIS — Z79899 Other long term (current) drug therapy: Secondary | ICD-10-CM | POA: Insufficient documentation

## 2018-08-11 DIAGNOSIS — R4182 Altered mental status, unspecified: Secondary | ICD-10-CM | POA: Insufficient documentation

## 2018-08-11 DIAGNOSIS — F314 Bipolar disorder, current episode depressed, severe, without psychotic features: Secondary | ICD-10-CM | POA: Diagnosis not present

## 2018-08-11 DIAGNOSIS — Z86711 Personal history of pulmonary embolism: Secondary | ICD-10-CM | POA: Diagnosis not present

## 2018-08-11 DIAGNOSIS — I10 Essential (primary) hypertension: Secondary | ICD-10-CM | POA: Insufficient documentation

## 2018-08-11 DIAGNOSIS — R443 Hallucinations, unspecified: Secondary | ICD-10-CM | POA: Diagnosis not present

## 2018-08-11 DIAGNOSIS — Z86718 Personal history of other venous thrombosis and embolism: Secondary | ICD-10-CM | POA: Diagnosis not present

## 2018-08-11 DIAGNOSIS — R451 Restlessness and agitation: Secondary | ICD-10-CM | POA: Diagnosis present

## 2018-08-11 LAB — COMPREHENSIVE METABOLIC PANEL
ALT: 14 U/L (ref 0–44)
AST: 16 U/L (ref 15–41)
Albumin: 2.9 g/dL — ABNORMAL LOW (ref 3.5–5.0)
Alkaline Phosphatase: 79 U/L (ref 38–126)
Anion gap: 11 (ref 5–15)
BUN: 19 mg/dL (ref 8–23)
CO2: 26 mmol/L (ref 22–32)
Calcium: 9 mg/dL (ref 8.9–10.3)
Chloride: 97 mmol/L — ABNORMAL LOW (ref 98–111)
Creatinine, Ser: 1.04 mg/dL (ref 0.61–1.24)
GFR calc Af Amer: >60 mL/min (ref >60)
GFR calc non Af Amer: >60 mL/min (ref >60)
Glucose, Bld: 254 mg/dL — ABNORMAL HIGH (ref 70–99)
Potassium: 4.2 mmol/L (ref 3.5–5.1)
Sodium: 134 mmol/L — ABNORMAL LOW (ref 135–145)
Total Bilirubin: 0.7 mg/dL (ref 0.3–1.2)
Total Protein: 6.8 g/dL (ref 6.5–8.1)

## 2018-08-11 LAB — VALPROIC ACID LEVEL: Valproic Acid Lvl: <10 ug/mL — ABNORMAL LOW (ref 50.0–100.0)

## 2018-08-11 LAB — CBC
HCT: 41.7 % (ref 39.0–52.0)
Hemoglobin: 14.3 g/dL (ref 13.0–17.0)
MCH: 29.1 pg (ref 26.0–34.0)
MCHC: 34.3 g/dL (ref 30.0–36.0)
MCV: 84.9 fL (ref 78.0–100.0)
Platelets: 294 10*3/uL (ref 150–400)
RBC: 4.91 MIL/uL (ref 4.22–5.81)
RDW: 12.3 % (ref 11.5–15.5)
WBC: 6.5 10*3/uL (ref 4.0–10.5)

## 2018-08-11 LAB — CBG MONITORING, ED
Glucose-Capillary: 314 mg/dL — ABNORMAL HIGH (ref 70–99)
Glucose-Capillary: 288 mg/dL — ABNORMAL HIGH (ref 70–99)

## 2018-08-11 LAB — ETHANOL: Alcohol, Ethyl (B): <10 mg/dL (ref <10)

## 2018-08-11 MED ORDER — FLUOXETINE HCL 20 MG PO CAPS
20.0000 mg | ORAL_CAPSULE | Freq: Every day | ORAL | Status: DC
  Administered 2018-08-11 – 2018-08-13 (×3): 20 mg via ORAL
  Filled 2018-08-11 (×5): qty 1

## 2018-08-11 MED ORDER — SODIUM CHLORIDE 0.9 % IV BOLUS
1000.0000 mL | Freq: Once | INTRAVENOUS | Status: AC
  Administered 2018-08-11: 1000 mL via INTRAVENOUS

## 2018-08-11 MED ORDER — CLONIDINE HCL 0.1 MG PO TABS
0.2000 mg | ORAL_TABLET | Freq: Two times a day (BID) | ORAL | Status: DC
  Administered 2018-08-12 – 2018-08-13 (×5): 0.2 mg via ORAL
  Filled 2018-08-11 (×7): qty 2

## 2018-08-11 MED ORDER — DIVALPROEX SODIUM 500 MG PO DR TAB
1000.0000 mg | DELAYED_RELEASE_TABLET | Freq: Every day | ORAL | Status: DC
  Administered 2018-08-12 – 2018-08-13 (×3): 1000 mg via ORAL
  Filled 2018-08-11 (×3): qty 2

## 2018-08-11 MED ORDER — FUROSEMIDE 20 MG PO TABS
20.0000 mg | ORAL_TABLET | Freq: Every day | ORAL | Status: DC
  Administered 2018-08-11 – 2018-08-13 (×3): 20 mg via ORAL
  Filled 2018-08-11 (×5): qty 1

## 2018-08-11 MED ORDER — AMLODIPINE BESYLATE 5 MG PO TABS
10.0000 mg | ORAL_TABLET | ORAL | Status: DC
  Administered 2018-08-12 – 2018-08-14 (×3): 10 mg via ORAL
  Filled 2018-08-11 (×3): qty 2

## 2018-08-11 MED ORDER — BUPROPION HCL ER (XL) 150 MG PO TB24
150.0000 mg | ORAL_TABLET | Freq: Every day | ORAL | Status: DC
  Administered 2018-08-11 – 2018-08-13 (×3): 150 mg via ORAL
  Filled 2018-08-11 (×5): qty 1

## 2018-08-11 MED ORDER — HYDROCHLOROTHIAZIDE 12.5 MG PO CAPS
12.5000 mg | ORAL_CAPSULE | Freq: Every day | ORAL | Status: DC
  Administered 2018-08-11 – 2018-08-13 (×3): 12.5 mg via ORAL
  Filled 2018-08-11 (×5): qty 1

## 2018-08-11 MED ORDER — ATORVASTATIN CALCIUM 10 MG PO TABS
10.0000 mg | ORAL_TABLET | ORAL | Status: DC
  Administered 2018-08-12 – 2018-08-14 (×3): 10 mg via ORAL
  Filled 2018-08-11 (×4): qty 1

## 2018-08-11 MED ORDER — METFORMIN HCL ER 500 MG PO TB24
1000.0000 mg | ORAL_TABLET | Freq: Every day | ORAL | Status: DC
  Administered 2018-08-11 – 2018-08-13 (×3): 1000 mg via ORAL
  Filled 2018-08-11 (×4): qty 2

## 2018-08-11 NOTE — ED Triage Notes (Signed)
Per EMS: Social worker states mental status is increasingly worse over past month.  Pt d/c from [facility] state psych facility.  over the past several days pt has been refusing food and meds, believing someone is trying to poison him.  CBG 482.  5 mg versed IM given.  Pt refused to answer orientation question before sedation.  After sedation he was A&Ox3.

## 2018-08-11 NOTE — ED Notes (Signed)
Pt is refusing this RN to get blood-work, give fluids, and give PO meds.  He states "the ambulance people should not have brought me here.  Pt seems to be getting more agitated.

## 2018-08-11 NOTE — ED Notes (Signed)
Pt aware of urine sample. Placed condom cath.

## 2018-08-11 NOTE — ED Notes (Signed)
Bed: WA20 Expected date:  Expected time:  Means of arrival:  Comments: 67 yo AMS, hyperglycemia-versed given

## 2018-08-11 NOTE — ED Provider Notes (Signed)
[facility]-EMERGENCY DEPT Provider Note   CSN: [id number] Arrival date & time: 08/11/18  1527     History   Chief Complaint Chief Complaint  Patient presents with  . Hyperglycemia  . Hallucinations   Level V caveat: uncooperative  HPI Christian [name] is a 67 y.o. male.  HPI 67 year old male presents the emergency department from his independent living facility where family reports he has become increasingly agitated and combative.  Family reports that he is having hallucinations.  Family reports altered mental status.  Family also reports noncompliance with his medications.  [facility]   Past Medical History:  Diagnosis Date  . 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 Bipolar 1 disorder (HCC)   . Bipolar disorder (HCC)   . Chronic kidney disease   . Gout   . Hx of blood clots   . Hypertension   . Mood swings   . OSA on CPAP 06/02/2014  . Other and unspecified hyperlipidemia 06/02/2014  . Type II or unspecified type diabetes mellitus with unspecified complication, uncontrolled 06/02/2014    Patient Active Problem List   Diagnosis Date Noted  . Encephalopathy   . Paroxysmal atrial fibrillation (HCC) 06/18/2018  . History of pulmonary embolism 06/17/2018  . Generalized weakness 03/27/2018  . Diabetes mellitus type 2 in nonobese (HCC) 03/27/2018  . Pulmonary embolism (HCC) 03/27/2018  . Fall   . Laceration of eyebrow   . Bipolar I disorder, most recent episode depressed, severe w psychosis (HCC) 12/22/2015  . Bipolar affective disorder, depressed, severe, with psychotic behavior (HCC)   . Bipolar I disorder, current or most recent episode depressed, with psychotic features (HCC)   . Bipolar I disorder, most recent episode depressed (HCC)   . Severe bipolar I disorder with depression (HCC)   . Bipolar I disorder, most recent episode depressed, severe with psychotic  features (HCC)   . Overdose 09/11/2015  . Acute respiratory failure with hypoxia (HCC) 09/05/2015  . Elevated troponin 09/05/2015  . Somnolence 09/05/2015  . Bipolar depression (HCC) 09/05/2015  . Acute bilateral deep vein thrombosis (DVT) of femoral veins (HCC) 09/05/2015  . Bilateral pulmonary embolism (HCC) 09/02/2015  . Labile hypertension   . Pulmonary embolism with acute cor pulmonale (HCC)   . Dyspnea   . Orthostatic hypotension   . Bipolar disorder, in partial remission, most recent episode depressed (HCC)   . Syncope, near 08/31/2015  . Affective psychosis, bipolar (HCC)   . Bipolar disorder, now depressed (HCC) 08/28/2015  . Bipolar I disorder, severe, current or most recent episode depressed, with psychotic features (HCC)   . Pressure ulcer 07/30/2015  . Protein-calorie malnutrition, severe (HCC) 07/30/2015  . Dehydration 07/29/2015  . Bipolar disorder, current episode depressed, severe, with psychotic features (HCC) 07/09/2015  . Diabetes (HCC) 07/09/2015  . UTI (urinary tract infection) 06/19/2014  . Positive blood culture 06/19/2014  . Bipolar affective disorder, current episode manic with psychotic symptoms (HCC) 06/17/2014  . Type 2 diabetes mellitus with complication, without long-term current use of insulin (HCC) 06/02/2014  . Other and unspecified hyperlipidemia 06/02/2014  . 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 Gout 06/02/2014  . Hypertension     History reviewed. No pertinent surgical history.      Home Medications    Prior to Admission medications   Medication Sig Start Date End Date Taking? Authorizing Provider  acetaminophen (TYLENOL) 325  MG tablet Take 2 tablets (650 mg total) by mouth every 6 (six) hours as needed for mild pain (or Fever >/= 101). Patient not taking: Reported on 08/01/2018 06/23/18   Alwyn Ren, MD  amLODipine (NORVASC) 10 MG tablet Take 10 mg by mouth every morning.     [provider]  atorvastatin (LIPITOR) 10 MG tablet Take 10 mg by mouth every morning.    [provider]  buPROPion (WELLBUTRIN XL) 150 MG 24 hr tablet Take 1 tablet (150 mg total) by mouth daily. Patient not taking: Reported on 08/04/2018 06/24/18   Alwyn Ren, MD  cloNIDine (CATAPRES) 0.2 MG tablet Take 0.2 mg by mouth 2 (two) times daily.    [provider]  divalproex (DEPAKOTE) 500 MG DR tablet Take 1,000 mg by mouth 2 (two) times daily.     [provider]  docusate sodium (COLACE) 100 MG capsule Take 1 capsule (100 mg total) by mouth 2 (two) times daily. Patient not taking: Reported on 08/01/2018 06/23/18   Alwyn Ren, MD  FLUoxetine (PROZAC) 20 MG capsule Take 20 mg by mouth daily.    [provider]  furosemide (LASIX) 20 MG tablet Take 20 mg by mouth daily.    [provider]  hydrochlorothiazide (HYDRODIURIL) 25 MG tablet Take 25 mg by mouth daily.    [provider]  hydrochlorothiazide (MICROZIDE) 12.5 MG capsule Take 1 capsule (12.5 mg total) by mouth daily. Patient not taking: Reported on 08/11/2018 06/24/18   Alwyn Ren, MD  insulin aspart (NOVOLOG) 100 UNIT/ML injection Inject 0-9 Units into the skin 3 (three) times daily with meals. Patient not taking: Reported on 08/11/2018 06/23/18   Alwyn Ren, MD  metFORMIN (GLUCOPHAGE-XR) 500 MG 24 hr tablet Take 1,000 mg by mouth 2 (two) times daily.     [provider]  rivaroxaban (XARELTO) 20 MG TABS tablet Take 1 tablet (20 mg total) by mouth daily with supper. Patient not taking: Reported on 08/11/2018 06/23/18   Alwyn Ren, MD  senna (SENOKOT) 8.6 MG tablet Take 1 tablet by mouth 2 (two) times daily.    [provider]    Family History Family History  Problem Relation Age of Onset  . Dementia Mother   . Arthritis Father   . Hypertension Father   . Mental illness Sister   . Diabetes Sister   . Heart failure Neg Hx     . Kidney failure Neg Hx   . Cancer Neg Hx     Social History Social History   Tobacco Use  . Smoking status: Never Smoker  . Smokeless tobacco: Never Used  Substance Use Topics  . Alcohol use: No  . Drug use: No     Allergies   Zyprexa [olanzapine]   Review of Systems Review of Systems  Unable to perform ROS: Other     Physical Exam Updated Vital Signs BP (!) 175/106 (BP Location: Right Arm)   Pulse (!) 105   Resp 16   Ht 6' (1.829 m)   Wt 104.3 kg   SpO2 96%   BMI 31.19 kg/m   Physical Exam  Constitutional: He is oriented to person, place, and time. He appears well-developed and well-nourished.  HENT:  Head: Normocephalic and atraumatic.  Eyes: EOM are normal.  Neck: Normal range of motion.  Cardiovascular: Normal rate, regular rhythm, normal heart sounds and intact distal pulses.  Pulmonary/Chest: Effort normal and breath sounds normal. No respiratory distress.  Abdominal: Soft. He exhibits no distension. There is no tenderness.  Musculoskeletal: Normal range of motion.  Neurological: He is alert and oriented to person, place, and time.  Skin: Skin is warm and dry.  Psychiatric:  Easily agitated  Nursing note and vitals reviewed.    ED Treatments / Results  Labs (all labs ordered are listed, but only abnormal results are displayed) Labs Reviewed  COMPREHENSIVE METABOLIC PANEL - Abnormal; Notable for the following components:      Result Value   Sodium 134 (*)    Chloride 97 (*)    Glucose, Bld 254 (*)    Albumin 2.9 (*)    All other components within normal limits  VALPROIC ACID LEVEL - Abnormal; Notable for the following components:   Valproic Acid Lvl <10 (*)    All other components within normal limits  CBG MONITORING, ED - Abnormal; Notable for the following components:   Glucose-Capillary 314 (*)    All other components within normal limits  CBG MONITORING, ED - Abnormal; Notable for the following components:   Glucose-Capillary 288  (*)    All other components within normal limits  CBC  ETHANOL  RAPID URINE DRUG SCREEN, HOSP PERFORMED  URINALYSIS, ROUTINE W REFLEX MICROSCOPIC    EKG None  Radiology No results found.  Procedures Procedures (including critical care time)  Medications Ordered in ED Medications  amLODipine (NORVASC) tablet 10 mg (has no administration in time range)  atorvastatin (LIPITOR) tablet 10 mg (has no administration in time range)  buPROPion (WELLBUTRIN XL) 24 hr tablet 150 mg (150 mg Oral Given 08/11/18 2006)  divalproex (DEPAKOTE) DR tablet 1,000 mg (has no administration in time range)  cloNIDine (CATAPRES) tablet 0.2 mg (has no administration in time range)  FLUoxetine (PROZAC) capsule 20 mg (20 mg Oral Given 08/11/18 2007)  furosemide (LASIX) tablet 20 mg (20 mg Oral Given 08/11/18 2007)  hydrochlorothiazide (MICROZIDE) capsule 12.5 mg (12.5 mg Oral Given 08/11/18 2007)  metFORMIN (GLUCOPHAGE-XR) 24 hr tablet 1,000 mg (1,000 mg Oral Given 08/11/18 2007)  sodium chloride 0.9 % bolus 1,000 mL (1,000 mLs Intravenous New Bag/Given 08/11/18 1930)     Initial Impression / Assessment and Plan / ED Course  I have reviewed the triage vital signs and the nursing notes.  Pertinent labs & imaging results that were available during my care of the patient were reviewed by me and considered in my medical decision making (see chart for details).     Medically clear at this time. TTS to evaluate for increasing agitation and hallucinations. Home meds written for  Final Clinical Impressions(s) / ED Diagnoses   Final diagnoses:  None    ED Discharge Orders    None       Azalia Bilis, MD 08/11/18 2327

## 2018-08-12 LAB — RAPID URINE DRUG SCREEN, HOSP PERFORMED
Amphetamines: NOT DETECTED
Barbiturates: NOT DETECTED
Benzodiazepines: POSITIVE — AB
Cocaine: NOT DETECTED
Opiates: NOT DETECTED
Tetrahydrocannabinol: NOT DETECTED

## 2018-08-12 LAB — CBG MONITORING, ED
Glucose-Capillary: 207 mg/dL — ABNORMAL HIGH (ref 70–99)
Glucose-Capillary: 189 mg/dL — ABNORMAL HIGH (ref 70–99)
Glucose-Capillary: 129 mg/dL — ABNORMAL HIGH (ref 70–99)

## 2018-08-12 LAB — URINALYSIS, ROUTINE W REFLEX MICROSCOPIC
Bacteria, UA: NONE SEEN
Bilirubin Urine: NEGATIVE
Glucose, UA: >=500 mg/dL — AB
Hgb urine dipstick: NEGATIVE
Ketones, ur: NEGATIVE mg/dL
Leukocytes, UA: NEGATIVE
Nitrite: NEGATIVE
Protein, ur: 30 mg/dL — AB
Specific Gravity, Urine: 1.008 (ref 1.005–1.030)
pH: 7 (ref 5.0–8.0)

## 2018-08-12 MED ORDER — INSULIN ASPART 100 UNIT/ML ~~LOC~~ SOLN: 0.0000 [IU] | Freq: Every day | SUBCUTANEOUS | Status: DC

## 2018-08-12 MED ORDER — INSULIN ASPART 100 UNIT/ML ~~LOC~~ SOLN
0.0000 [IU] | Freq: Three times a day (TID) | SUBCUTANEOUS | Status: DC
  Administered 2018-08-12: 2 [IU] via SUBCUTANEOUS
  Administered 2018-08-12: 3 [IU] via SUBCUTANEOUS
  Administered 2018-08-13: 1 [IU] via SUBCUTANEOUS
  Administered 2018-08-13: 2 [IU] via SUBCUTANEOUS
  Administered 2018-08-14: 3 [IU] via SUBCUTANEOUS
  Administered 2018-08-14: 1 [IU] via SUBCUTANEOUS
  Administered 2018-08-14: 3 [IU] via SUBCUTANEOUS
  Filled 2018-08-12 (×7): qty 1

## 2018-08-12 MED ORDER — RIVAROXABAN 20 MG PO TABS
20.0000 mg | ORAL_TABLET | Freq: Every day | ORAL | Status: DC
  Administered 2018-08-12 – 2018-08-13 (×2): 20 mg via ORAL
  Filled 2018-08-12 (×3): qty 1

## 2018-08-12 NOTE — BH Assessment (Addendum)
Assessment Note  Christian [name] is an 67 y.o. male who presents to the ED voluntarily due to aggression and combativeness in the home. Pt is calm and cooperative during the assessment. Pt is incontinent and prior to TTS conducting the assessment, ED staff was assisting in cleaning the pt's bed and garments due to incontinence. Pt stated "I made a mess in here." Pt states he is sad about "being so damn old." Pt states he does not want to be in the ED and stated "they should not have brought me back here." Pt states his daughter "Christian [name]" called EMS but he states he does not know the reason. Pt denies SI and denies HI. Pt was asked if he experiences AVH and he admits to hearing voices but states he cannot describe the voices to this Clinical research associate. Pt states "my daughter is only 5, she called because I was acting up. She shouldn't have called."   Pt was assessed by TTS on 08/01/18 due to declining mental health and refusing to eat, tend to personal hygiene, or take medications. Pt was later psych cleared and recommended for a social work evaluation. Per chart review, social work was involved with assisting the pt in his d/c home to an assisted living facility and coordinating with the pt's family to ensure the pt is able to return home.   Per Donell Sievert, PA pt is recommended for social work consult. Pt denies SI and HI. Pt is psych cleared. EDP Dr. Nicanor Alcon, MD and pt's nurse Zella Ball, RN have been advised.  Diagnosis: Mild neurocognitive disorder due to another medical condition  Past Medical History:  Past Medical History:  Diagnosis Date  . 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 Bipolar 1 disorder (HCC)   . Bipolar disorder (HCC)   . Chronic kidney disease   . Gout   . Hx of blood clots   . Hypertension   . Mood swings   . OSA on CPAP 06/02/2014  . Other and unspecified hyperlipidemia 06/02/2014  . Type II or unspecified type diabetes mellitus with unspecified  complication, uncontrolled 06/02/2014    History reviewed. No pertinent surgical history.  Family History:  Family History  Problem Relation Age of Onset  . Dementia Mother   . Arthritis Father   . Hypertension Father   . Mental illness Sister   . Diabetes Sister   . Heart failure Neg Hx   . Kidney failure Neg Hx   . Cancer Neg Hx     Social History:  reports that he has never smoked. He has never used smokeless tobacco. He reports that he does not drink alcohol or use drugs.  Additional Social History:  Alcohol / Drug Use Pain Medications: see MAR Prescriptions: see MAR Over the Counter: see MAR History of alcohol / drug use?: No history of alcohol / drug abuse  CIWA: CIWA-Ar BP: (!) 175/106 Pulse Rate: (!) 105 COWS:    Allergies:  Allergies  Allergen Reactions  . Zyprexa [Olanzapine] Other (See Comments)    Allergy noted by ACT Team. No reaction specified.    Home Medications:  (Not in a hospital admission)  OB/GYN Status:  No LMP for male patient.  General Assessment Data Location of Assessment: [facility] TTS Assessment: In system Is this a Tele or Face-to-Face Assessment?: Face-to-Face Is this an Initial Assessment or a Re-assessment for this encounter?: Initial Assessment Patient Accompanied by:: (alone) Language Other than English: No Living Arrangements: In Assisted  Living/Nursing Home (Comment: Name of Nursing Home What gender do you identify as?: Male Marital status: Divorced Pregnancy Status: No Can pt return to current living arrangement?: Yes Admission Status: Voluntary Is patient capable of signing voluntary admission?: Yes Referral Source: Self/Family/Friend Insurance type: [facility]     Crisis Care Plan Name of Psychiatrist: Dr Farah(per chart review ) Name of Therapist: none reported  Education Status Is patient currently in school?: No Is the patient employed, unemployed or receiving disability?: Receiving disability income,  Unemployed  Risk to self with the past 6 months Suicidal Ideation: No Has patient been a risk to self within the past 6 months prior to admission? : No Suicidal Intent: No Has patient had any suicidal intent within the past 6 months prior to admission? : No Is patient at risk for suicide?: No Suicidal Plan?: No Has patient had any suicidal plan within the past 6 months prior to admission? : No Access to Means: No What has been your use of drugs/alcohol within the last 12 months?: denies  Previous Attempts/Gestures: No Triggers for Past Attempts: None known Intentional Self Injurious Behavior: None Family Suicide History: No Recent stressful life event(s): Recent negative physical changes Persecutory voices/beliefs?: No Depression: Yes Depression Symptoms: Despondent, Tearfulness, Feeling worthless/self pity Substance abuse history and/or treatment for substance abuse?: No Suicide prevention information given to non-admitted patients: Not applicable  Risk to Others within the past 6 months Homicidal Ideation: No Does patient have any lifetime risk of violence toward others beyond the six months prior to admission? : Yes (comment)(per EDP note) Thoughts of Harm to Others: No Current Homicidal Intent: No Current Homicidal Plan: No Access to Homicidal Means: No History of harm to others?: Yes Assessment of Violence: On admission Violent Behavior Description: family reports he has become increasingly agitated and combative Does patient have access to weapons?: No Criminal Charges Pending?: No Does patient have a court date: No Is patient on probation?: No  Psychosis Hallucinations: Auditory(per EDP) Delusions: None noted  Mental Status Report Appearance/Hygiene: Disheveled, In hospital gown Eye Contact: Good Motor Activity: Unsteady Speech: Slow, Slurred Level of Consciousness: Quiet/awake Mood: Depressed, Sad Affect: Sad Anxiety Level: None Thought Processes: Thought  Blocking Judgement: Partial Orientation: Person, Place, Time Obsessive Compulsive Thoughts/Behaviors: None  Cognitive Functioning Concentration: Poor Memory: Remote Impaired, Recent Impaired Is patient IDD: No Insight: Poor Impulse Control: Poor Appetite: Good Have you had any weight changes? : No Change Sleep: No Change Total Hours of Sleep: 7 Vegetative Symptoms: None  ADLScreening [facility] Assessment Services) Patient's cognitive ability adequate to safely complete daily activities?: Yes Patient able to express need for assistance with ADLs?: Yes Independently performs ADLs?: No  Prior Inpatient Therapy Prior Inpatient Therapy: Yes Prior Therapy Dates: several admissions over several years Prior Therapy Facilty/Provider(s): [facility], [facility] Reason for Treatment: bipolar  Prior Outpatient Therapy Prior Outpatient Therapy: No Does patient have an ACCT team?: Yes([facility] ACT [facility]) Does patient have Intensive In-House Services?  : No Does patient have [facility] services? : No Does patient have P4CC services?: No  ADL Screening (condition at time of admission) Patient's cognitive ability adequate to safely complete daily activities?: Yes Is the patient deaf or have difficulty hearing?: Yes Does the patient have difficulty seeing, even when wearing glasses/contacts?: Yes Does the patient have difficulty concentrating, remembering, or making decisions?: Yes Patient able to express need for assistance with ADLs?: Yes Does the patient have difficulty dressing or bathing?: Yes Independently performs ADLs?: No Communication: Independent Dressing (OT): Needs  assistance Is this a change from baseline?: Pre-admission baseline Grooming: Needs assistance Is this a change from baseline?: Pre-admission baseline Feeding: Independent Bathing: Dependent Is this a change from baseline?: Pre-admission baseline Toileting: Dependent Is this a change from baseline?: Pre-admission  baseline In/Out Bed: Dependent Is this a change from baseline?: Pre-admission baseline Walks in Home: Dependent Is this a change from baseline?: Pre-admission baseline Does the patient have difficulty walking or climbing stairs?: Yes Weakness of Legs: Both Weakness of Arms/Hands: Both  Home Assistive Devices/Equipment Home Assistive Devices/Equipment: Environmental consultant (specify type)    Abuse/Neglect Assessment (Assessment to be complete while patient is alone) Abuse/Neglect Assessment Can Be Completed: Yes Physical Abuse: Denies Verbal Abuse: Denies Sexual Abuse: Denies Exploitation of patient/patient's resources: Denies Self-Neglect: Denies     Merchant navy officer (For Healthcare) Does Patient Have a Medical Advance Directive?: Yes Type of Advance Directive: Healthcare Power of Attorney(ex- wife, Campione,Nadine) Copy of Healthcare Power of Attorney in Chart?: No - copy requested Would patient like information on creating a medical advance directive?: No - Patient declined          Disposition: Per Donell Sievert, PA pt is recommended for social work consult. Pt denies SI and HI. Pt is psych cleared. EDP Dr. Nicanor Alcon, MD and pt's nurse Zella Ball, RN have been advised. Disposition Initial Assessment Completed for this Encounter: Yes Disposition of Patient: Transfer Type of outpatient treatment: Other(social work consult ) Patient refused recommended treatment: No Patient referred to: Social Work  On Site Evaluation by:   Reviewed with Physician:    Karolee Ohs 08/12/2018 1:52 AM

## 2018-08-12 NOTE — ED Notes (Addendum)
MD notified of pt being a diabetic and need for a diet. Order given for a carb modified diet and blood sugar checks.

## 2018-08-12 NOTE — Progress Notes (Addendum)
CSW made aware that patient is psychiatrically and medically clear for discharge.    CSW familiar with patient, CSW will contact patient's family members in an attempt to assist patient with discharging back to [facility], where he resides.  CSW attempted to speak with patient at bedside. Patient not engaging with Child psychotherapist. Patient states he does not have a key to his apartment, when asked who might patient looks away and says "nobody." Patient's family had an apartment key during prior ED encounters   CSW to consult Social Work AD, as CSW may experience difficulties in assisting with patient D/C    CSW attempted to reach the following contacts without success and left HIPPA compliant voicemails for each party:  Tresa Endo from [employer] ACT Team at [phone] - to assist in discharge planning  Torrian Wetz, patient's daughter, [phone] assist in discharge planning  Nadine Brophy, patient's ex-wife, [phone] to assist in discharge planning   Enid Cutter, LCSW-A Clinical Social Worker [phone]

## 2018-08-12 NOTE — Progress Notes (Addendum)
CSW staffed patient with Social Work Surveyor, quantity. AD familiar with patient and discharge barriers.  AD advises patient discharge as patient is medically and psychiatrically cleared for discharge.  CSW met with patient at bedside a second time to discuss discharge. Patient minimally engages with CSW and presents as angry. Patient declines SNF and nursing home placement.   When the plan to discharge back to [place] is brought up, patient says "why are we going through this again? We've been through this." CSW asked patient what his ideal discharge plan or living situation would be, patient states "there isn't one." CSW explained that she cannot work with or advocate for the patient to change his living arrangements without patient participation and feedback, as he is his own legal guardian.  Patient states he does not want to remain in the hospital, nor does he want to return to his ILF.  CSW will continue to reach out to family members and ACT Team members to locate someone with a key to patient's apartment.  Plan to discharge patient home to [place] via [facility]; patient will likely require family involvement to access apartment.   Update 3:07pm   CSW spoke with ACT team member Cecilie Lowers at ph: [phone]. Cecilie Lowers familiar with patient and patient's family situation. Cecilie Lowers expressed concerns regarding patient's psychiatric clearance. Cecilie Lowers does not believe that the ACT Team has a key to patient's apartment and will reach out to patients family in an effort to coordinate discharge.   Update 4:10pm  CSW spoke with ACT team member, Cecilie Lowers. Cecilie Lowers expressed further concerns for the patient's discharge and stated an APS report had been filed. CSW discussed the fact that the patient is his own legal guardian and has declined placement services and [place] in the past. Cecilie Lowers requested to speak with attending psychiatrist, CSW stated that psychiatry covers multiple campuses over the weekend  and that psychiatry has left for the day.   Cecilie Lowers has attempted contact with patient's daughter and ex-wife; neither answered their phone or called back. Patient's ACT Team does not have a key to patient's apartment at this time and neither does patient.   CSW continuing to follow to assist in discharge planning.    Stephanie Acre, [place] Social Worker [phone]

## 2018-08-12 NOTE — ED Notes (Signed)
Pt refused vitals RN notify

## 2018-08-12 NOTE — Progress Notes (Signed)
Per Donell Sievert, PA pt is recommended for social work consult. Pt denies SI and HI. Pt is psych cleared. EDP Dr. Nicanor Alcon, MD and pt's nurse Zella Ball, RN have been advised.  Princess Bruins, MSW, LCSW Therapeutic Triage Specialist  [phone]

## 2018-08-12 NOTE — BHH Suicide Risk Assessment (Cosign Needed)
Suicide Risk Assessment  Discharge Assessment   [facility] Discharge Suicide Risk Assessment   Principal Problem: <principal problem not specified> Discharge Diagnoses:  Patient Active Problem List   Diagnosis Date Noted  . Encephalopathy [G93.40]   . Paroxysmal atrial fibrillation (HCC) [I48.0] 06/18/2018  . History of pulmonary embolism [Z86.711] 06/17/2018  . Generalized weakness [R53.1] 03/27/2018  . Diabetes mellitus type 2 in nonobese (HCC) [E11.9] 03/27/2018  . Pulmonary embolism (HCC) [I26.99] 03/27/2018  . Fall [W19.XXXA]   . Laceration of eyebrow [S01.119A]   . Bipolar I disorder, most recent episode depressed, severe w psychosis (HCC) [F31.5] 12/22/2015  . Bipolar affective disorder, depressed, severe, with psychotic behavior (HCC) [F31.5]   . Bipolar I disorder, current or most recent episode depressed, with psychotic features (HCC) [F31.5]   . Bipolar I disorder, most recent episode depressed (HCC) [F31.30]   . Severe bipolar I disorder with depression (HCC) [F31.4]   . Bipolar I disorder, most recent episode depressed, severe with psychotic features (HCC) [F31.5]   . Overdose [T50.901A] 09/11/2015  . Acute respiratory failure with hypoxia (HCC) [J96.01] 09/05/2015  . Elevated troponin [R74.8] 09/05/2015  . Somnolence [R40.0] 09/05/2015  . Bipolar depression (HCC) [F31.9] 09/05/2015  . Acute bilateral deep vein thrombosis (DVT) of femoral veins (HCC) [I82.413] 09/05/2015  . Bilateral pulmonary embolism (HCC) [I26.99] 09/02/2015  . Labile hypertension [R09.89]   . Pulmonary embolism with acute cor pulmonale (HCC) [I26.09]   . Dyspnea [R06.00]   . Orthostatic hypotension [I95.1]   . Bipolar disorder, in partial remission, most recent episode depressed (HCC) [F31.75]   . Syncope, near [R55] 08/31/2015  . Affective psychosis, bipolar (HCC) [F31.9]   . Bipolar disorder, now depressed (HCC) [F31.30] 08/28/2015  . Bipolar I disorder, severe, current or most recent episode  depressed, with psychotic features (HCC) [F31.5]   . Pressure ulcer [L89.90] 07/30/2015  . Protein-calorie malnutrition, severe (HCC) [E43] 07/30/2015  . Dehydration [E86.0] 07/29/2015  . Bipolar disorder, current episode depressed, severe, with psychotic features (HCC) [F31.5] 07/09/2015  . Diabetes (HCC) [E11.9] 07/09/2015  . UTI (urinary tract infection) [N39.0] 06/19/2014  . Positive blood culture [R78.81] 06/19/2014  . Bipolar affective disorder, current episode manic with psychotic symptoms (HCC) [F31.2] 06/17/2014  . Type 2 diabetes mellitus with complication, without long-term current use of insulin (HCC) [E11.8] 06/02/2014  . Other and unspecified hyperlipidemia [E78.5] 06/02/2014  . 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 Gout [M10.9] 06/02/2014  . Hypertension [I10]     Total Time spent with patient: 30 minutes  Musculoskeletal: Strength & Muscle Tone: within normal limits Gait & Station: normal Patient leans: N/A  Psychiatric Specialty Exam:   Blood pressure (!) 137/95, pulse 90, temperature 98.4 F (36.9 C), temperature source Oral, resp. rate 10, height 6' (1.829 m), weight 104.3 kg, SpO2 97 %.Body mass index is 31.19 kg/m.  General Appearance: Casual  Eye Contact::  Good  Speech:  Clear and Coherent409  Volume:  Decreased  Mood:  Depressed  Affect:  Congruent and Depressed  Thought Process:  Coherent and Linear  Orientation:  Full (Time, Place, and Person)  Thought Content:  Logical  Suicidal Thoughts:  No  Homicidal Thoughts:  No  Memory:  Immediate;   Good Recent;   Good Remote;   Fair  Judgement:  Fair  Insight:  Fair  Psychomotor Activity:  Decreased  Concentration:  Fair  Recall:  Jennelle Human of Knowledge:Fair  Language: Good  Akathisia:  No  Handed:  Right  AIMS (if indicated):     Assets:  Architect Housing  Sleep:     Cognition: WNL  ADL's:  Intact    Mental Status Per Nursing Assessment::   On Admission:    Pt was seen and chart reviewed with treatment team and Dr Jannifer Franklin. Pt lives in an independent living situation and stated his daughter called EMS to bring him to the hospital because he was "acting up." Pt denies suicidal and homicidal ideation and denies auditory and visual hallucinations. Pt does not appear to be responding to internal stimuli. Pt stated his daughter thinks she is taking care of him but she needs to mind her business. Pt is psychiatrically clear for social work to coordinate return to his independent living home.   Demographic Factors:  Male, Low socioeconomic status and Unemployed  Loss Factors: Decline in physical health  Historical Factors: Impulsivity  Risk Reduction Factors:   Sense of responsibility to family  Continued Clinical Symptoms:  Bipolar Disorder:   Depressive phase Depression:   Impulsivity Medical Diagnoses and Treatments/Surgeries  Cognitive Features That Contribute To Risk:  Closed-mindedness    Suicide Risk:  Minimal: No identifiable suicidal ideation.  Patients presenting with no risk factors but with morbid ruminations; may be classified as minimal risk based on the severity of the depressive symptoms    Plan Of Care/Follow-up recommendations:  Activity:  as tolerated Diet:  Heart healthy  [name], NP 08/12/2018, 11:58 AM

## 2018-08-13 LAB — CBG MONITORING, ED
Glucose-Capillary: 120 mg/dL — ABNORMAL HIGH (ref 70–99)
Glucose-Capillary: 166 mg/dL — ABNORMAL HIGH (ref 70–99)
Glucose-Capillary: 149 mg/dL — ABNORMAL HIGH (ref 70–99)
Glucose-Capillary: 126 mg/dL — ABNORMAL HIGH (ref 70–99)

## 2018-08-13 NOTE — Progress Notes (Signed)
CSW aware that patient is psychiatrically and medically clear for discharge.   The current barrier to discharge is that the patient does not have a key to his apartment. Per patient's ACT Team, patient's family retains copies of key. Patient's family have not answered CSW's calls or returned phone calls.   Plan is for patient is discharge home to his residence at [facility] via [facility].  CSW spoke with Tasia Catchings of [employer] [phone]). Tasia Catchings has reached out to patient's family as well and has not gotten a response. Per Tasia Catchings, the family is "burnt out" with the patient's recent hospitalizations and it may be a reluctance to assist the patient in discharging home.  Tasia Catchings states he has been in contact with other members of the ACT Team and they will make additional efforts to reach patients family tomorrow, as more staff are available on weekdays.    CSW attempted to reach the following contacts without success and left HIPPA compliant voicemails for each party:  Armed forces operational officer, patient's daughter,[phone] assist in discharge planning  Nadine Custodio, patient's ex-wife,[phone] assist in discharge planning      Enid Cutter, LCSW-A Clinical Social Worker [phone]

## 2018-08-13 NOTE — ED Notes (Signed)
Pt is very combative and uncooperative all day RN notify

## 2018-08-14 LAB — CBG MONITORING, ED
Glucose-Capillary: 247 mg/dL — ABNORMAL HIGH (ref 70–99)
Glucose-Capillary: 139 mg/dL — ABNORMAL HIGH (ref 70–99)
Glucose-Capillary: 208 mg/dL — ABNORMAL HIGH (ref 70–99)

## 2018-08-14 MED ORDER — AMLODIPINE BESYLATE 5 MG PO TABS
5.0000 mg | ORAL_TABLET | Freq: Once | ORAL | Status: AC
  Administered 2018-08-14: 5 mg via ORAL
  Filled 2018-08-14: qty 1

## 2018-08-14 NOTE — ED Notes (Signed)
Dr. Blinda Leatherwood informed of pt's axillary temp, 99.0.

## 2018-08-14 NOTE — Progress Notes (Addendum)
CSW received handoff from weekend CSW. CSW also familiar with patient from prior ED visits. CSW aware patient is psychiatrically and medically clear for discharge. CSW has attempted to contact both daughter [phone] and ex-wife [phone] to coordinate transportation. CSW left voicemails for return calls.  CSW spoke with Tasia Catchings of [employer] ACT Team [phone] regarding patient disposition. Per Tasia Catchings, he would staff patient with his team and call CSW back in an hour. CSW will await return call.  12:50pm- CSW reached back out to [place] with [employer] ACT Team regarding patient disposition. Tasia Catchings voiced concern with patient being psychiatrically cleared, stating that it is because of his mental health that patient is unable to care for himself. Tasia Catchings also voiced concern for a wound on patient's lower back that may be infected and requested that a doctor look at it. CSW to communicate this to EDP. Per Tasia Catchings, he would be out to assess patient soon but did not feel like he could assist without participation from the family.  3:00pm- CSW spoke with Tasia Catchings who came to visit patient. Tasia Catchings reiterated that he did not feel comfortable receiving patient by himself without family participation. CSW spoke with daughter, [name], who stated that she would be able to meet patient at his house at 5:30pm. CSW will leave handoff for 2nd shift ED CSW.  [name], LCSWA  Clinical Social Work Department  [employer]  [phone]

## 2018-08-14 NOTE — ED Notes (Signed)
Patient combative during linen change and peri care, spitting and hitting at staff.

## 2018-08-14 NOTE — ED Notes (Signed)
When trying to give meds, patient spitting and biting at hands.

## 2018-08-14 NOTE — Progress Notes (Signed)
CSW called pt's daughter to let her know the pt had D/C'd and the pt's daughter stated the pt had just arrived and that the pt was "being combative" and refused to allow himself to be removed from the gurney.  Pt's daughter stated the EMT's were considering calling the fire department at this time.  CSW voiced understanding.  Pt's daughter hung up the phone.  Please reconsult if future social work needs arise.  CSW signing off, as social work intervention is no longer needed.  [name]. Jin Capote, LCSW, LCAS, CSI Clinical Social Worker Ph: [phone]

## 2018-08-14 NOTE — ED Notes (Signed)
Dr. Blinda Leatherwood informed of pt's elevated b/p.

## 2018-08-14 NOTE — ED Notes (Signed)
Patient combative and screaming and saying "fuck you get out".

## 2018-08-14 NOTE — ED Notes (Signed)
Pt stated "get the f--- out!"  Pt unable to follow directions when this writer attempting to brush his teeth.  Pt was able to assist with oral hygiene last week when this writer assisted.

## 2018-08-22 DIAGNOSIS — F319 Bipolar disorder, unspecified: Secondary | ICD-10-CM | POA: Insufficient documentation

## 2018-09-16 DIAGNOSIS — K5900 Constipation, unspecified: Secondary | ICD-10-CM | POA: Insufficient documentation

## 2019-10-18 ENCOUNTER — Emergency Department (HOSPITAL_COMMUNITY)
Admission: EM | Admit: 2019-10-18 | Discharge: 2019-10-24 | Disposition: A | Discharge status: Home / Self Care | Payer: Medicare PPO | Attending: Emergency Medicine | Admitting: Emergency Medicine

## 2019-10-18 ENCOUNTER — Other Ambulatory Visit: Payer: Self-pay

## 2019-10-18 DIAGNOSIS — Z86711 Personal history of pulmonary embolism: Secondary | ICD-10-CM | POA: Diagnosis not present

## 2019-10-18 DIAGNOSIS — R600 Localized edema: Secondary | ICD-10-CM | POA: Diagnosis not present

## 2019-10-18 DIAGNOSIS — I129 Hypertensive chronic kidney disease with stage 1 through stage 4 chronic kidney disease, or unspecified chronic kidney disease: Secondary | ICD-10-CM | POA: Diagnosis not present

## 2019-10-18 DIAGNOSIS — Z20828 Contact with and (suspected) exposure to other viral communicable diseases: Secondary | ICD-10-CM | POA: Diagnosis not present

## 2019-10-18 DIAGNOSIS — N189 Chronic kidney disease, unspecified: Secondary | ICD-10-CM | POA: Insufficient documentation

## 2019-10-18 DIAGNOSIS — E1122 Type 2 diabetes mellitus with diabetic chronic kidney disease: Secondary | ICD-10-CM | POA: Diagnosis not present

## 2019-10-18 DIAGNOSIS — Z79899 Other long term (current) drug therapy: Secondary | ICD-10-CM | POA: Insufficient documentation

## 2019-10-18 DIAGNOSIS — N3000 Acute cystitis without hematuria: Secondary | ICD-10-CM | POA: Diagnosis not present

## 2019-10-18 DIAGNOSIS — R4689 Other symptoms and signs involving appearance and behavior: Secondary | ICD-10-CM | POA: Diagnosis present

## 2019-10-18 DIAGNOSIS — Z86718 Personal history of other venous thrombosis and embolism: Secondary | ICD-10-CM | POA: Insufficient documentation

## 2019-10-18 DIAGNOSIS — Z794 Long term (current) use of insulin: Secondary | ICD-10-CM | POA: Insufficient documentation

## 2019-10-18 DIAGNOSIS — Z7901 Long term (current) use of anticoagulants: Secondary | ICD-10-CM | POA: Insufficient documentation

## 2019-10-18 DIAGNOSIS — F3131 Bipolar disorder, current episode depressed, mild: Secondary | ICD-10-CM | POA: Insufficient documentation

## 2019-10-18 LAB — CBC
HCT: 40.3 % (ref 39.0–52.0)
Hemoglobin: 12.9 g/dL — ABNORMAL LOW (ref 13.0–17.0)
MCH: 27.2 pg (ref 26.0–34.0)
MCHC: 32 g/dL (ref 30.0–36.0)
MCV: 84.8 fL (ref 80.0–100.0)
Platelets: 245 10*3/uL (ref 150–400)
RBC: 4.75 MIL/uL (ref 4.22–5.81)
RDW: 12.4 % (ref 11.5–15.5)
WBC: 6.3 10*3/uL (ref 4.0–10.5)
nRBC: 0 % (ref 0.0–0.2)

## 2019-10-18 LAB — ETHANOL: Alcohol, Ethyl (B): <10 mg/dL (ref <10)

## 2019-10-18 LAB — SALICYLATE LEVEL: Salicylate Lvl: <7 mg/dL (ref 2.8–30.0)

## 2019-10-18 LAB — ACETAMINOPHEN LEVEL: Acetaminophen (Tylenol), Serum: <10 ug/mL — ABNORMAL LOW (ref 10–30)

## 2019-10-18 NOTE — ED Triage Notes (Signed)
Pt's daughter brought pt in for being "slumped over". Pt's daughter states EMS came out and he refused to go with EMS. Pt has hx of mental health issues and was prescribed a new medication and she thinks he took too much (2 pills of .5mg  clonazepam instead of 1 and 3 Aripiprazole instead of 1). Patient keeps saying "I don't want to be here".

## 2019-10-18 NOTE — ED Notes (Signed)
Family at bedside. 

## 2019-10-19 LAB — COMPREHENSIVE METABOLIC PANEL
ALT: 13 U/L (ref 0–44)
AST: 13 U/L — ABNORMAL LOW (ref 15–41)
Albumin: 3 g/dL — ABNORMAL LOW (ref 3.5–5.0)
Alkaline Phosphatase: 88 U/L (ref 38–126)
Anion gap: 10 (ref 5–15)
BUN: 25 mg/dL — ABNORMAL HIGH (ref 8–23)
CO2: 26 mmol/L (ref 22–32)
Calcium: 8.4 mg/dL — ABNORMAL LOW (ref 8.9–10.3)
Chloride: 91 mmol/L — ABNORMAL LOW (ref 98–111)
Creatinine, Ser: 2.05 mg/dL — ABNORMAL HIGH (ref 0.61–1.24)
GFR calc Af Amer: 38 mL/min — ABNORMAL LOW (ref >60)
GFR calc non Af Amer: 33 mL/min — ABNORMAL LOW (ref >60)
Glucose, Bld: 398 mg/dL — ABNORMAL HIGH (ref 70–99)
Potassium: 4.5 mmol/L (ref 3.5–5.1)
Sodium: 127 mmol/L — ABNORMAL LOW (ref 135–145)
Total Bilirubin: 0.6 mg/dL (ref 0.3–1.2)
Total Protein: 6.8 g/dL (ref 6.5–8.1)

## 2019-10-19 LAB — URINALYSIS, ROUTINE W REFLEX MICROSCOPIC
Bilirubin Urine: NEGATIVE
Glucose, UA: >=500 mg/dL — AB
Ketones, ur: NEGATIVE mg/dL
Nitrite: POSITIVE — AB
Protein, ur: 30 mg/dL — AB
Specific Gravity, Urine: 1.013 (ref 1.005–1.030)
pH: 5 (ref 5.0–8.0)

## 2019-10-19 LAB — RAPID URINE DRUG SCREEN, HOSP PERFORMED
Amphetamines: NOT DETECTED
Barbiturates: NOT DETECTED
Benzodiazepines: NOT DETECTED
Cocaine: NOT DETECTED
Opiates: NOT DETECTED
Tetrahydrocannabinol: NOT DETECTED

## 2019-10-19 LAB — CBG MONITORING, ED
Glucose-Capillary: 234 mg/dL — ABNORMAL HIGH (ref 70–99)
Glucose-Capillary: 185 mg/dL — ABNORMAL HIGH (ref 70–99)

## 2019-10-19 LAB — SARS CORONAVIRUS 2 (TAT 6-24 HRS): SARS Coronavirus 2: NEGATIVE

## 2019-10-19 MED ORDER — ACETAMINOPHEN 325 MG PO TABS: 650.0000 mg | ORAL_TABLET | Freq: Four times a day (QID) | ORAL | Status: DC | PRN

## 2019-10-19 MED ORDER — SODIUM CHLORIDE 0.9 % IV SOLN
1.0000 g | Freq: Once | INTRAVENOUS | Status: AC
  Administered 2019-10-19: 02:00:00 1 g via INTRAVENOUS
  Filled 2019-10-19: qty 10

## 2019-10-19 MED ORDER — FUROSEMIDE 20 MG PO TABS: 20.0000 mg | ORAL_TABLET | ORAL | Status: DC

## 2019-10-19 MED ORDER — SODIUM CHLORIDE 0.9 % IV BOLUS
1000.0000 mL | Freq: Once | INTRAVENOUS | Status: AC
  Administered 2019-10-19: 1000 mL via INTRAVENOUS

## 2019-10-19 MED ORDER — INSULIN ASPART 100 UNIT/ML ~~LOC~~ SOLN
8.0000 [IU] | Freq: Once | SUBCUTANEOUS | Status: AC
  Administered 2019-10-19: 8 [IU] via INTRAVENOUS

## 2019-10-19 MED ORDER — MODAFINIL 100 MG PO TABS: 100.0000 mg | ORAL_TABLET | Freq: Every day | ORAL | Status: DC

## 2019-10-19 MED ORDER — LISINOPRIL 20 MG PO TABS
20.0000 mg | ORAL_TABLET | Freq: Every day | ORAL | Status: DC
  Administered 2019-10-20 – 2019-10-24 (×5): 20 mg via ORAL
  Filled 2019-10-19 (×5): qty 1

## 2019-10-19 MED ORDER — AMLODIPINE BESYLATE 5 MG PO TABS
10.0000 mg | ORAL_TABLET | Freq: Every day | ORAL | Status: DC
  Administered 2019-10-20 – 2019-10-24 (×5): 10 mg via ORAL
  Filled 2019-10-19 (×5): qty 2

## 2019-10-19 MED ORDER — RIVAROXABAN 20 MG PO TABS
20.0000 mg | ORAL_TABLET | Freq: Every day | ORAL | Status: DC
  Administered 2019-10-19 – 2019-10-24 (×6): 20 mg via ORAL
  Filled 2019-10-19 (×7): qty 1

## 2019-10-19 MED ORDER — INSULIN ASPART 100 UNIT/ML ~~LOC~~ SOLN
0.0000 [IU] | Freq: Three times a day (TID) | SUBCUTANEOUS | Status: DC
  Administered 2019-10-20 (×2): 2 [IU] via SUBCUTANEOUS
  Administered 2019-10-21: 1 [IU] via SUBCUTANEOUS
  Administered 2019-10-21 – 2019-10-23 (×7): 2 [IU] via SUBCUTANEOUS
  Administered 2019-10-23 – 2019-10-24 (×2): 3 [IU] via SUBCUTANEOUS
  Administered 2019-10-24: 08:00:00 2 [IU] via SUBCUTANEOUS

## 2019-10-19 MED ORDER — DIVALPROEX SODIUM 125 MG PO DR TAB
125.0000 mg | DELAYED_RELEASE_TABLET | Freq: Two times a day (BID) | ORAL | Status: DC
  Administered 2019-10-19 – 2019-10-22 (×6): 125 mg via ORAL
  Filled 2019-10-19 (×7): qty 1

## 2019-10-19 MED ORDER — ATORVASTATIN CALCIUM 10 MG PO TABS
10.0000 mg | ORAL_TABLET | Freq: Every evening | ORAL | Status: DC
  Administered 2019-10-19 – 2019-10-23 (×5): 10 mg via ORAL
  Filled 2019-10-19 (×5): qty 1

## 2019-10-19 MED ORDER — CEPHALEXIN 250 MG PO CAPS
250.0000 mg | ORAL_CAPSULE | Freq: Once | ORAL | Status: AC
  Administered 2019-10-19: 250 mg via ORAL
  Filled 2019-10-19: qty 1

## 2019-10-19 MED ORDER — ARIPIPRAZOLE 10 MG PO TABS
10.0000 mg | ORAL_TABLET | Freq: Every day | ORAL | Status: DC
  Administered 2019-10-19 – 2019-10-24 (×6): 10 mg via ORAL
  Filled 2019-10-19 (×6): qty 1

## 2019-10-19 MED ORDER — INSULIN GLARGINE 100 UNIT/ML ~~LOC~~ SOLN
16.0000 [IU] | Freq: Every day | SUBCUTANEOUS | Status: DC
  Administered 2019-10-20 – 2019-10-24 (×5): 16 [IU] via SUBCUTANEOUS
  Filled 2019-10-19 (×6): qty 0.16

## 2019-10-19 MED ORDER — INSULIN ASPART 100 UNIT/ML ~~LOC~~ SOLN
0.0000 [IU] | Freq: Every day | SUBCUTANEOUS | Status: DC
  Administered 2019-10-21: 2 [IU] via SUBCUTANEOUS

## 2019-10-19 MED ORDER — LORAZEPAM 0.5 MG PO TABS
0.5000 mg | ORAL_TABLET | Freq: Three times a day (TID) | ORAL | Status: DC
  Administered 2019-10-19 – 2019-10-22 (×9): 0.5 mg via ORAL
  Filled 2019-10-19 (×9): qty 1

## 2019-10-19 MED ORDER — METOPROLOL SUCCINATE ER 25 MG PO TB24
100.0000 mg | ORAL_TABLET | Freq: Two times a day (BID) | ORAL | Status: DC
  Administered 2019-10-19 – 2019-10-24 (×9): 100 mg via ORAL
  Filled 2019-10-19 (×10): qty 4

## 2019-10-19 NOTE — ED Notes (Signed)
CBG [id number]  

## 2019-10-19 NOTE — ED Notes (Signed)
Patient offered sprite to help with giving urine specimen. Pt declined. Put sprite at bedside. Gave pt urinal. Explained if we couldn't get him to give specimen we would have to do an in and out cath or place a condom cath on him to get it. Pt verbalized understanding.

## 2019-10-19 NOTE — BHH Counselor (Signed)
RN to call clinician once TTS cart is located and set up.   Vertell Novak, MS, [facility], [facility] Triage Specialist [phone].

## 2019-10-19 NOTE — ED Provider Notes (Signed)
[facility]   CSN: [id number] Arrival date & time: 10/18/19  2245     History   Chief Complaint Chief Complaint  Patient presents with  . Medical Clearance    HPI Christian [name] is a 68 y.o. male.     Patient is a 68 year old male with past medical history of bipolar disorder, obesity, obstructive sleep apnea, diabetes, and chronic renal insufficiency.  He is brought today by his daughter for evaluation of behavioral changes.  From what the daughter says, he has been emotionally labile for the past 3 weeks.  He becomes angry and combative easily.  His psychiatrist attempted to change his medications and he was given prescriptions for clonazepam and aripiprazole.  He was to begin taking these today.  The daughter believes that he may have taken extra doses of these medications.  This evening he was found to be less responsive.  EMS was called to the house, however the patient woke up and refused to be transported.  The daughter was able to coax him to come to the ER to be evaluated after speaking with the TTS representatives.  The history is provided by the patient.    Past Medical History:  Diagnosis Date  . 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 Bipolar 1 disorder (HCC)   . Bipolar disorder (HCC)   . Chronic kidney disease   . Gout   . Hx of blood clots   . Hypertension   . Mood swings   . OSA on CPAP 06/02/2014  . Other and unspecified hyperlipidemia 06/02/2014  . Type II or unspecified type diabetes mellitus with unspecified complication, uncontrolled 06/02/2014    Patient Active Problem List   Diagnosis Date Noted  . Encephalopathy   . Paroxysmal atrial fibrillation (HCC) 06/18/2018  . History of pulmonary embolism 06/17/2018  . Generalized weakness 03/27/2018  . Diabetes mellitus type 2 in nonobese (HCC) 03/27/2018  . Pulmonary embolism (HCC) 03/27/2018  . Fall   .  Laceration of eyebrow   . Bipolar I disorder, most recent episode depressed, severe w psychosis (HCC) 12/22/2015  . Bipolar affective disorder, depressed, severe, with psychotic behavior (HCC)   . Bipolar I disorder, current or most recent episode depressed, with psychotic features (HCC)   . Bipolar I disorder, most recent episode depressed (HCC)   . Severe bipolar I disorder with depression (HCC)   . Bipolar I disorder, most recent episode depressed, severe with psychotic features (HCC)   . Overdose 09/11/2015  . Acute respiratory failure with hypoxia (HCC) 09/05/2015  . Elevated troponin 09/05/2015  . Somnolence 09/05/2015  . Bipolar depression (HCC) 09/05/2015  . Acute bilateral deep vein thrombosis (DVT) of femoral veins (HCC) 09/05/2015  . Bilateral pulmonary embolism (HCC) 09/02/2015  . Labile hypertension   . Pulmonary embolism with acute cor pulmonale (HCC)   . Dyspnea   . Orthostatic hypotension   . Bipolar disorder, in partial remission, most recent episode depressed (HCC)   . Syncope, near 08/31/2015  . Affective psychosis, bipolar (HCC)   . Bipolar disorder, now depressed (HCC) 08/28/2015  . Bipolar I disorder, severe, current or most recent episode depressed, with psychotic features (HCC)   . Pressure ulcer 07/30/2015  . Protein-calorie malnutrition, severe (HCC) 07/30/2015  . Dehydration 07/29/2015  . Bipolar disorder, current episode depressed, severe, with psychotic features (HCC) 07/09/2015  . Diabetes (HCC) 07/09/2015  . UTI (urinary tract infection) 06/19/2014  . Positive  blood culture 06/19/2014  . Bipolar affective disorder, current episode manic with psychotic symptoms ([place]) 06/17/2014  . Type 2 diabetes mellitus with complication, without long-term current use of insulin ([place]) 06/02/2014  . Other and unspecified hyperlipidemia 06/02/2014  . 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 Gout 06/02/2014  . Hypertension      No past surgical history on file.      Home Medications    Prior to Admission medications   Medication Sig Start Date End Date Taking? Authorizing Provider  acetaminophen (TYLENOL) 325 MG tablet Take 2 tablets (650 mg total) by mouth every 6 (six) hours as needed for mild pain (or Fever >/= 101). Patient not taking: Reported on 08/01/2018 06/23/18   Georgette Shell, MD  amLODipine (NORVASC) 10 MG tablet Take 10 mg by mouth every morning.     [provider]  atorvastatin (LIPITOR) 10 MG tablet Take 10 mg by mouth every morning.    [provider]  buPROPion (WELLBUTRIN XL) 150 MG 24 hr tablet Take 1 tablet (150 mg total) by mouth daily. Patient not taking: Reported on 08/04/2018 06/24/18   Georgette Shell, MD  cloNIDine (CATAPRES) 0.2 MG tablet Take 0.2 mg by mouth 2 (two) times daily.    [provider]  divalproex (DEPAKOTE) 500 MG DR tablet Take 1,000 mg by mouth 2 (two) times daily.     [provider]  docusate sodium (COLACE) 100 MG capsule Take 1 capsule (100 mg total) by mouth 2 (two) times daily. Patient not taking: Reported on 08/01/2018 06/23/18   Georgette Shell, MD  FLUoxetine (PROZAC) 20 MG capsule Take 20 mg by mouth daily.    [provider]  furosemide (LASIX) 20 MG tablet Take 20 mg by mouth daily.    [provider]  hydrochlorothiazide (HYDRODIURIL) 25 MG tablet Take 25 mg by mouth daily.    [provider]  hydrochlorothiazide (MICROZIDE) 12.5 MG capsule Take 1 capsule (12.5 mg total) by mouth daily. Patient not taking: Reported on 08/11/2018 06/24/18   Georgette Shell, MD  insulin aspart (NOVOLOG) 100 UNIT/ML injection Inject 0-9 Units into the skin 3 (three) times daily with meals. Patient not taking: Reported on 08/11/2018 06/23/18   Georgette Shell, MD  metFORMIN (GLUCOPHAGE-XR) 500 MG 24 hr tablet Take 1,000 mg by mouth 2 (two) times daily.     [provider]  rivaroxaban  (XARELTO) 20 MG TABS tablet Take 1 tablet (20 mg total) by mouth daily with supper. Patient not taking: Reported on 08/11/2018 06/23/18   Georgette Shell, MD  senna (SENOKOT) 8.6 MG tablet Take 1 tablet by mouth 2 (two) times daily.    [provider]    Family History Family History  Problem Relation Age of Onset  . Dementia Mother   . Arthritis Father   . Hypertension Father   . Mental illness Sister   . Diabetes Sister   . Heart failure Neg Hx   . Kidney failure Neg Hx   . Cancer Neg Hx     Social History Social History   Tobacco Use  . Smoking status: Never Smoker  . Smokeless tobacco: Never Used  Substance Use Topics  . Alcohol use: No  . Drug use: No     Allergies   Zyprexa [olanzapine]   Review of Systems Review of Systems  All other systems reviewed and are negative.    Physical Exam Updated Vital Signs  BP 128/79   Pulse (!) 105   Temp 99 F (37.2 C) (Oral)   Resp (!) 24   SpO2 100%   Physical Exam Vitals signs and nursing note reviewed.  Constitutional:      General: He is not in acute distress.    Appearance: He is well-developed. He is not diaphoretic.  HENT:     Head: Normocephalic and atraumatic.  Neck:     Musculoskeletal: Normal range of motion and neck supple.  Cardiovascular:     Rate and Rhythm: Normal rate and regular rhythm.     Heart sounds: No murmur. No friction rub.  Pulmonary:     Effort: Pulmonary effort is normal. No respiratory distress.     Breath sounds: Normal breath sounds. No wheezing or rales.  Abdominal:     General: Bowel sounds are normal. There is no distension.     Palpations: Abdomen is soft.     Tenderness: There is no abdominal tenderness.  Musculoskeletal: Normal range of motion.     Right lower leg: Edema present.     Left lower leg: Edema present.  Skin:    General: Skin is warm and dry.  Neurological:     Mental Status: He is alert and oriented to person, place, and time.      Coordination: Coordination normal.      ED Treatments / Results  Labs (all labs ordered are listed, but only abnormal results are displayed) Labs Reviewed  COMPREHENSIVE METABOLIC PANEL - Abnormal; Notable for the following components:      Result Value   Sodium 127 (*)    Chloride 91 (*)    Glucose, Bld 398 (*)    BUN 25 (*)    Creatinine, Ser 2.05 (*)    Calcium 8.4 (*)    Albumin 3.0 (*)    AST 13 (*)    GFR calc non Af Amer 33 (*)    GFR calc Af Amer 38 (*)    All other components within normal limits  ACETAMINOPHEN LEVEL - Abnormal; Notable for the following components:   Acetaminophen (Tylenol), Serum <10 (*)    All other components within normal limits  CBC - Abnormal; Notable for the following components:   Hemoglobin 12.9 (*)    All other components within normal limits  ETHANOL  SALICYLATE LEVEL  RAPID URINE DRUG SCREEN, HOSP PERFORMED  URINALYSIS, ROUTINE W REFLEX MICROSCOPIC    EKG None  Radiology No results found.  Procedures Procedures (including critical care time)  Medications Ordered in ED Medications  sodium chloride 0.9 % bolus 1,000 mL (has no administration in time range)  insulin aspart (novoLOG) injection 8 Units (has no administration in time range)     Initial Impression / Assessment and Plan / ED Course  I have reviewed the triage vital signs and the nursing notes.  Pertinent labs & imaging results that were available during my care of the patient were reviewed by me and considered in my medical decision making (see chart for details).  Patient presenting here accompanied by his daughter for evaluation of increasing agitation and aggressive behavior.  This has been worsening over the past several weeks.  His medications were recently changed and it appears as though this evening he may have taken more than what was prescribed.  He was initially found to be somnolent by family members he initially refused EMS transport, then was  convinced by his family to come in to be seen.  Patient's medical  work-up reveals hyperglycemia and slightly worsening renal function from his baseline.  Patient was given insulin and normal saline.  TTS has evaluated and feels as though the patient does meet inpatient criteria.  He has been observed for several hours and feel as though he is medically cleared for psychiatric admission.  He does have findings consistent with a UTI for which he was given Rocephin.  Final Clinical Impressions(s) / ED Diagnoses   Final diagnoses:  None    ED Discharge Orders    None       Geoffery Lyons, MD 10/19/19 [phone]

## 2019-10-19 NOTE — ED Notes (Signed)
Patient's daughter Sharl Ma was let back to visit patient; Family was advised of visiting policy in [place] due to Covid; RN clarified Medication orders and family took all patient belongings home; RN will notify EDP for Lantus order-Monique,RN

## 2019-10-19 NOTE — BH Assessment (Addendum)
Tele Assessment Note   Patient Name: Christian [name] MRN: [id number] Referring Physician: Dr. Veryl [name]. Location of Patient: [name] ED, [phone]. Location of Provider: [place] is an 68 y.o. male, who presents voluntary and accompanied by his daughter/legal guardian Christian [name], [phone].) Clinician asked the pt, "what brought you to the hospital?" Pt reported, he thinks he has a UTI. Per daughter, for the past three weeks the pt has having a hard time with reality. Per daughter, the pt will ask if she works one hour per day, she doesn't have any money, she can't leave his house because she doesn't have a car. Pt's daughter reported, the pt is paranoid, jumpy, and on edge. Pt's daughter reported, when she came to the pt's house tonight he was slumped over with his tongue out, had slurred speech, his blood sugar was 379, had a fever of 101 and initially refused to come the to hospital EMS. Per daughter, the pt's ACT Team, dropped off medications today (10/18/2019) and she believes the pt took an extra dose of each. Pt reported, he took the correct dose. Pt's daughter reported, she counted the medications; for clonazepam there were 60 tablets and she counted 30. Per daughter, there were 30 tablets of aripiprazole prescribed but she counted 27 in the bottle. Pt then admitted to taking the extra medications and denied he was trying to kill himself. Per daughter, when trying to get the pt to got the hospital the pt was screaming, pushing, becoming agitated which is not normal. Pt denies, current SI, HI, AVH, self-injurious behaviors and access to weapons.  Pt denies abuse and substance use. Pt's UDS is negative. Pt is [facility] to [facility] for medication management. Pt seen Christian [name] on Tuesday (10/16/2019). Per daughter, pt was hospitalized at [facility] in 2019 for 2-3 months.   Pt presents quiet, awake under covers with soft  speech. Pt's eye contact was poor. Pt's mood was pleasant. Pt's affect was flat. Pt's thought process was circumstantial. Pt's judgement was impaired. Pt was oriented x3. Pt's concentration was fair. Pt's insight and impulse control was poor. Pt reported, if discharged home he could contract for safety. Pt's daughter replied, "its hard to say," when asked if she feel the pt could be safe at home alone. Pt's daughter reported, if inpatient treatment is recommend she will sign-in the pt voluntarily.    Diagnosis: Bipolar 1 Disorder ([place]).  Past Medical History:  Past Medical History:  Diagnosis Date  . 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 Bipolar 1 disorder ([place])   . Bipolar disorder ([place])   . Chronic kidney disease   . Gout   . Hx of blood clots   . Hypertension   . Mood swings   . OSA on CPAP 06/02/2014  . Other and unspecified hyperlipidemia 06/02/2014  . Type II or unspecified type diabetes mellitus with unspecified complication, uncontrolled 06/02/2014    No past surgical history on file.  Family History:  Family History  Problem Relation Age of Onset  . Dementia Mother   . Arthritis Father   . Hypertension Father   . Mental illness Sister   . Diabetes Sister   . Heart failure Neg Hx   . Kidney failure Neg Hx   . Cancer Neg Hx     Social History:  reports that he has never smoked. He has never used smokeless tobacco. He reports  that he does not drink alcohol or use drugs.  Additional Social History:  Alcohol / Drug Use Pain Medications: See MAR Prescriptions: See MAR Over the Counter: See MAR History of alcohol / drug use?: No history of alcohol / drug abuse  CIWA: CIWA-Ar BP: (!) 147/68 Pulse Rate: (!) 101 COWS:    Allergies:  Allergies  Allergen Reactions  . Zyprexa [Olanzapine] Other (See Comments)    Allergy noted by ACT Team. No reaction specified (??)    Home Medications: (Not in a hospital admission)   OB/GYN  Status:  No LMP for male patient.  General Assessment Data Location of Assessment: [facility] ED TTS Assessment: In system Is this a Tele or Face-to-Face Assessment?: Tele Assessment Is this an Initial Assessment or a Re-assessment for this encounter?: Initial Assessment Patient Accompanied by:: Other(Christian [name], daughter/legal guardian, [phone].) Language Other than English: No Living Arrangements: Other (Comment)(Alone at Independent Living Facility. ) What gender do you identify as?: Male Marital status: Divorced Living Arrangements: Biomedical scientist. ) Can pt return to current living arrangement?: Yes Admission Status: Voluntary Is patient capable of signing voluntary admission?: No(Pt has a legal guardian. ) Referral Source: Self/Family/Friend Insurance type: [employer].      Crisis Care Plan Living Arrangements: Alone(At Independent Living Facility. ) Legal Guardian: Other:(Christian [name], daughter/legal guardian, [phone].) Name of Psychiatrist: Dr. Jeannine [name].  Name of Therapist: [facility].  Education Status Is patient currently in school?: No Is the patient employed, unemployed or receiving disability?: Receiving disability income  Risk to self with the past 6 months Suicidal Ideation: No-Not Currently/Within Last 6 Months Has patient been a risk to self within the past 6 months prior to admission? : No(Pt denies. ) Suicidal Intent: No Has patient had any suicidal intent within the past 6 months prior to admission? : No Is patient at risk for suicide?: No Suicidal Plan?: No Has patient had any suicidal plan within the past 6 months prior to admission? : No Access to Means: No What has been your use of drugs/alcohol within the last 12 months?: UDS is negative.  Previous Attempts/Gestures: Yes How many times?: 4 Other Self Harm Risks: NA Triggers for Past Attempts: Unknown Intentional Self Injurious Behavior:  None(Pt denies. ) Family Suicide History: Unable to assess Recent stressful life event(s): Other (Comment)(Pt reported, how his one bedroom apartment is setup. ) Persecutory voices/beliefs?: No Depression: Yes Depression Symptoms: Feeling angry/irritable, Insomnia, Despondent, Fatigue Substance abuse history and/or treatment for substance abuse?: No Suicide prevention information given to non-admitted patients: Not applicable  Risk to Others within the past 6 months Homicidal Ideation: No(Pt denies. ) Does patient have any lifetime risk of violence toward others beyond the six months prior to admission? : No(Pt denies. ) Thoughts of Harm to Others: No Current Homicidal Intent: No Current Homicidal Plan: No Access to Homicidal Means: No Identified Victim: NA History of harm to others?: No Assessment of Violence: None Noted Violent Behavior Description: NA Does patient have access to weapons?: No Criminal Charges Pending?: No Does patient have a court date: No Is patient on probation?: No  Psychosis Hallucinations: None noted(Pt denies. ) Delusions: None noted(Pt denies. )  Mental Status Report Appearance/Hygiene: Other (Comment)(Under covers. ) Eye Contact: Fair Motor Activity: Unremarkable Speech: Soft Level of Consciousness: Quiet/awake Mood: Pleasant Affect: Flat Anxiety Level: Minimal Thought Processes: Circumstantial Judgement: Impaired Orientation: Person, Place, Time Obsessive Compulsive Thoughts/Behaviors: None  Cognitive Functioning Concentration: Fair Memory: Recent Impaired Is patient IDD: No  Insight: Poor Impulse Control: Poor Appetite: Good Have you had any weight changes? : Gain Amount of the weight change? (lbs): (Unsure. ) Sleep: Decreased Total Hours of Sleep: 0 Vegetative Symptoms: Unable to Assess  ADLScreening [facility] Assessment Services) Patient's cognitive ability adequate to safely complete daily activities?: Yes Patient able to express  need for assistance with ADLs?: Yes Independently performs ADLs?: No  Prior Inpatient Therapy Prior Inpatient Therapy: Yes Prior Therapy Dates: 2019. Prior Therapy Facilty/Provider(s): [facility].  Reason for Treatment: Bipolar Disorder.   Prior Outpatient Therapy Prior Outpatient Therapy: Yes Prior Therapy Dates: Current.  Prior Therapy Facilty/Provider(s): [facility].  Reason for Treatment: Medicationa management.  Does patient have an ACCT team?: Yes Does patient have Intensive In-House Services?  : No Does patient have [facility] services? : No Does patient have P4CC services?: No  ADL Screening (condition at time of admission) Patient's cognitive ability adequate to safely complete daily activities?: Yes Is the patient deaf or have difficulty hearing?: No Does the patient have difficulty seeing, even when wearing glasses/contacts?: Yes(Pt wears glasses.) Does the patient have difficulty concentrating, remembering, or making decisions?: Yes Patient able to express need for assistance with ADLs?: Yes Does the patient have difficulty dressing or bathing?: Yes Independently performs ADLs?: No Communication: Independent Dressing (OT): Needs assistance Is this a change from baseline?: Pre-admission baseline Grooming: Needs assistance Is this a change from baseline?: Pre-admission baseline Feeding: Independent Bathing: Needs assistance Is this a change from baseline?: Pre-admission baseline Toileting: Needs assistance Is this a change from baseline?: Pre-admission baseline In/Out Bed: Independent Walks in Home: Needs assistance Is this a change from baseline?: Pre-admission baseline Does the patient have difficulty walking or climbing stairs?: Yes  Home Assistive Devices/Equipment Home Assistive Devices/Equipment: Environmental consultant (specify type)(Rollator walker.)    Abuse/Neglect Assessment (Assessment to be complete while patient is alone) Abuse/Neglect  Assessment Can Be Completed: Yes Physical Abuse: Denies(Pt denies.) Verbal Abuse: Denies(Pt denies.) Sexual Abuse: Denies(Pt denies.) Exploitation of patient/patient's resources: Denies(Pt denies.) Self-Neglect: Denies(Pt denies.)     Advance Directives (For Healthcare) Does Patient Have a Medical Advance Directive?: No          Disposition: Renaye Rakers, NP recommends gero-psychiatric inpatient treatment. Disposition discussed with Dr. Judd Lien and Greig Castilla, RN. TTS seek placement.    Disposition Initial Assessment Completed for this Encounter: Yes  This service was provided via telemedicine using a 2-way, interactive audio and video technology.  Names of all persons participating in this telemedicine service and their role in this encounter. Name: Kadence Mimbs Carneal Role: Patient.  Name: Redmond Pulling, MS, [facility], [facility] Role: Counselor.   Name: [name]. Role: Daughter/legal guardian.        Redmond Pulling 10/19/2019 4:16 AM    Redmond Pulling, MS, [facility], [facility] Triage Specialist [phone] .

## 2019-10-19 NOTE — ED Provider Notes (Signed)
Pt's care assumed from Dr. Stark Jock Pt awaiting psych eval and placement.  Pt received Rocephin for uti. IV fluids for dehydration   Fransico Meadow, PA-C 10/19/19 0750    Veryl Speak, MD 10/19/19 [phone]

## 2019-10-19 NOTE — Consult Note (Addendum)
  Patient requires inpatient psychiatric hospitalization per Dr Jake Samples. Patient would benefit from geriatric inpatient hospitalization. Dr Jake Samples, outpatient psychiatrist, recommends medication changes including: Ativan  0.5mg  TID Abilify 10mg  daily HOLD Provigil.

## 2019-10-19 NOTE — BHH Counselor (Signed)
Per daughter/legal guardian [name], [phone]) she has guardianship paperwork for pt and will bring to [facility] tomorrow.    Vertell Novak, [place], [facility], [facility] Triage Specialist [phone]

## 2019-10-19 NOTE — Progress Notes (Signed)
Pt meets inpatient criteria per Dr. Jake Samples. Referral information has been sent to the following hospitals for review:  [facility]  [place]       Disposition will continue to assist with inpatient placement needs.   Audree Camel, LCSW, [place] Disposition [facility] BHH/TTS [phone]

## 2019-10-19 NOTE — ED Notes (Signed)
Lunch ordered 

## 2019-10-20 LAB — CBG MONITORING, ED
Glucose-Capillary: 115 mg/dL — ABNORMAL HIGH (ref 70–99)
Glucose-Capillary: 151 mg/dL — ABNORMAL HIGH (ref 70–99)
Glucose-Capillary: 177 mg/dL — ABNORMAL HIGH (ref 70–99)
Glucose-Capillary: 182 mg/dL — ABNORMAL HIGH (ref 70–99)

## 2019-10-20 MED ORDER — FUROSEMIDE 20 MG PO TABS
40.0000 mg | ORAL_TABLET | Freq: Every morning | ORAL | Status: DC
  Administered 2019-10-21: 40 mg via ORAL
  Filled 2019-10-20: qty 2

## 2019-10-20 MED ORDER — FUROSEMIDE 20 MG PO TABS
20.0000 mg | ORAL_TABLET | Freq: Every day | ORAL | Status: DC
  Administered 2019-10-20: 20 mg via ORAL
  Filled 2019-10-20: qty 1

## 2019-10-20 NOTE — ED Notes (Signed)
TTS being performed.  

## 2019-10-20 NOTE — ED Notes (Signed)
Help clean patient up patient had stool on him now patient is resting with sitter at bedside

## 2019-10-20 NOTE — ED Notes (Signed)
Called [facility] inquiring about pt's lunch - advised will request for Kitchen to send now.

## 2019-10-20 NOTE — BHH Counselor (Signed)
Re-assessment:   Patient was oriented to place and time, however, patient was unable to express why he was at the hospital.When asked if he was taking his medication as prescribed patient stated 'No.' Patient report he does not like taking his medication because, "I do not want it." TTS informed patient he was in the hospital due to the concern of his daughter about his behavior and if he was taking his medication as prescribed. Patient denied SI, HI, and AVH.

## 2019-10-20 NOTE — BHH Counselor (Signed)
Disposition:   Merlyn Lot, NP, continue to recommend inpatient treatment

## 2019-10-20 NOTE — ED Notes (Signed)
Pt states he knows why he came to ED then states "I can't tell you". States he doesn't know. States he has nowhere to live. States has been living in an apartment but can no longer live there. Pt is oriented to self, place, and time.

## 2019-10-20 NOTE — ED Notes (Signed)
Pt had 2 loose stools this shift. Pt cleaned by staff. Pt also incont of urine. Pt states he knows when he needs to void or have BM - advised pt to notify staff prior so assistance may be provided - voiced understanding.

## 2019-10-21 LAB — BASIC METABOLIC PANEL
Anion gap: 11 (ref 5–15)
BUN: 12 mg/dL (ref 8–23)
CO2: 24 mmol/L (ref 22–32)
Calcium: 9.1 mg/dL (ref 8.9–10.3)
Chloride: 99 mmol/L (ref 98–111)
Creatinine, Ser: 1.16 mg/dL (ref 0.61–1.24)
GFR calc Af Amer: >60 mL/min (ref >60)
GFR calc non Af Amer: >60 mL/min (ref >60)
Glucose, Bld: 126 mg/dL — ABNORMAL HIGH (ref 70–99)
Potassium: 4.4 mmol/L (ref 3.5–5.1)
Sodium: 134 mmol/L — ABNORMAL LOW (ref 135–145)

## 2019-10-21 LAB — CBG MONITORING, ED
Glucose-Capillary: 123 mg/dL — ABNORMAL HIGH (ref 70–99)
Glucose-Capillary: 161 mg/dL — ABNORMAL HIGH (ref 70–99)
Glucose-Capillary: 154 mg/dL — ABNORMAL HIGH (ref 70–99)
Glucose-Capillary: 215 mg/dL — ABNORMAL HIGH (ref 70–99)

## 2019-10-21 MED ORDER — FUROSEMIDE 20 MG PO TABS
20.0000 mg | ORAL_TABLET | Freq: Every day | ORAL | Status: DC
  Administered 2019-10-22 – 2019-10-24 (×3): 20 mg via ORAL
  Filled 2019-10-21 (×3): qty 1

## 2019-10-21 NOTE — ED Notes (Addendum)
Pt's daughter, Sharl Ma, called - aware seeking Inpt Placement. She voiced that she prefers pt not go to [facility] as pt did not have a good experience there before and pt's sister is currently there. States he curses at his sister. Advised pt his daughter called and is encouraging him to get up out of bed. Pt states "She's only 38. She didn't call". Asked pt if he would like to speak w/daughter. Pt stated "no". Offered pt to ambulate to bathroom - pt stated "It's too late". Pt being cleaned by staff of incont.

## 2019-10-21 NOTE — BHH Counselor (Signed)
Re-assessment:   What reports he's not doing good due to the pandemic, "the pandemic is all over me, I am dead." Patient oriented x3, he's unable to recall why he's in the hospital. Patient denied SI, HI and AVH.

## 2019-10-21 NOTE — ED Notes (Signed)
Order received for Lasix 40mg  to be discontinued - as pt is on 20mg  daily - Per Dr Roderic Palau - readback for verification.

## 2019-10-21 NOTE — ED Notes (Signed)
Pt requested cheese, saltine crackers and sprite for snack. Pt given the same.

## 2019-10-21 NOTE — ED Notes (Signed)
Sitter and NT attempted to assist pt w/getting up out of bed and ambulating to bathroom - pt stood w/walker then laid back down. Pt refused to ambulate even after much encouragement.

## 2019-10-21 NOTE — BHH Counselor (Signed)
Disposition:   Lynann Bologna, NP, patient continues to meet inpatient criteria

## 2019-10-21 NOTE — ED Notes (Signed)
Pt ambulating w/walker in hallway then to bathroom w/o difficulty.

## 2019-10-22 LAB — CBG MONITORING, ED
Glucose-Capillary: 165 mg/dL — ABNORMAL HIGH (ref 70–99)
Glucose-Capillary: 191 mg/dL — ABNORMAL HIGH (ref 70–99)
Glucose-Capillary: 156 mg/dL — ABNORMAL HIGH (ref 70–99)
Glucose-Capillary: 188 mg/dL — ABNORMAL HIGH (ref 70–99)

## 2019-10-22 MED ORDER — DIVALPROEX SODIUM 250 MG PO DR TAB: 250.0000 mg | DELAYED_RELEASE_TABLET | Freq: Two times a day (BID) | ORAL | 0 refills | Status: DC

## 2019-10-22 MED ORDER — FUROSEMIDE 20 MG PO TABS: 20.0000 mg | ORAL_TABLET | Freq: Every day | ORAL | 0 refills | Status: DC

## 2019-10-22 MED ORDER — DIVALPROEX SODIUM 250 MG PO DR TAB
250.0000 mg | DELAYED_RELEASE_TABLET | Freq: Two times a day (BID) | ORAL | Status: DC
  Administered 2019-10-22 – 2019-10-24 (×4): 250 mg via ORAL
  Filled 2019-10-22 (×4): qty 1

## 2019-10-22 MED ORDER — ARIPIPRAZOLE 10 MG PO TABS: 10.0000 mg | ORAL_TABLET | Freq: Every day | ORAL | 0 refills | Status: DC

## 2019-10-22 NOTE — ED Notes (Signed)
[facility] Placement

## 2019-10-22 NOTE — Progress Notes (Signed)
Pt continues to meet inpatient criteria per Dr. Jake Samples. Referral information has been sent to the following hospitals for review:    [facility] Details      [facility] Details      [facility] Details      [facility] Details      [facility] Details      [facility] Details      [facility] Details      [facility]      Disposition will continue to follow for inpatient placement needs.   Audree Camel, LCSW, [place] Disposition [facility] BHH/TTS [phone]

## 2019-10-22 NOTE — ED Notes (Addendum)
Pt's dinner arrived. °

## 2019-10-22 NOTE — Progress Notes (Signed)
CSW spoke with pt's daughter, Sharl Ma. She voiced concerns that her father was being psychiatrically cleared and shared that he had a similar mental health episode last year and benefited from psychiatric inpatient. CSW contacted Dr. Jake Samples who will see pt shortly and call CSW back after his assessment.   Audree Camel, LCSW, [place] Disposition [facility] BHH/TTS [phone]

## 2019-10-22 NOTE — ED Notes (Signed)
This nurse received call from [facility] NP, informs this nurse pt is no longer being discharged home. Safety Sitter present. Will continue to monitor.

## 2019-10-22 NOTE — ED Notes (Signed)
Pt's CBG result was 156. Informed Caryl Pina - RN.

## 2019-10-22 NOTE — ED Notes (Signed)
This nurse reached out to pt legal guardian [name] , informed her of pt discharge, she is requesting to speak with psychiatry prior to picking pt up , this nurse notified Donzetta Sprung NP

## 2019-10-22 NOTE — Progress Notes (Signed)
Patient denies suicidal/homicidal ideations, hallucinations, and substance abuse on assessment this morning.  He was admitted to the emergency department 3 days ago for agitation and missed taking his medications.  Medications were started at the prescribed dose and he appears to be stable at this time.  Christian [name], [facility] NP

## 2019-10-22 NOTE — ED Notes (Signed)
Breakfast Ordered 

## 2019-10-23 LAB — CBG MONITORING, ED
Glucose-Capillary: 162 mg/dL — ABNORMAL HIGH (ref 70–99)
Glucose-Capillary: 217 mg/dL — ABNORMAL HIGH (ref 70–99)
Glucose-Capillary: 145 mg/dL — ABNORMAL HIGH (ref 70–99)
Glucose-Capillary: 182 mg/dL — ABNORMAL HIGH (ref 70–99)

## 2019-10-23 MED ORDER — MODAFINIL 100 MG PO TABS: 100.0000 mg | ORAL_TABLET | Freq: Every day | ORAL | 1 refills | Status: DC

## 2019-10-23 MED ORDER — LORAZEPAM 0.5 MG PO TABS: 0.5000 mg | ORAL_TABLET | Freq: Three times a day (TID) | ORAL | 0 refills | Status: DC

## 2019-10-23 MED ORDER — MODAFINIL 100 MG PO TABS
100.0000 mg | ORAL_TABLET | Freq: Every day | ORAL | Status: DC
  Administered 2019-10-24: 100 mg via ORAL
  Filled 2019-10-23: qty 1

## 2019-10-23 MED ORDER — LORAZEPAM 0.5 MG PO TABS
0.5000 mg | ORAL_TABLET | Freq: Three times a day (TID) | ORAL | Status: DC
  Administered 2019-10-23 – 2019-10-24 (×4): 0.5 mg via ORAL
  Filled 2019-10-23 (×4): qty 1

## 2019-10-23 NOTE — ED Notes (Signed)
Pt ambulated w/o difficulty to bathroom and back to room.

## 2019-10-23 NOTE — ED Notes (Signed)
Pt asking if it is Thanksgiving. Then states he did not know Thanksgiving occurs every year. Pt ate dinner and took meds w/o difficulty.

## 2019-10-23 NOTE — ED Notes (Signed)
Diet was ordered for Lunch. 

## 2019-10-23 NOTE — ED Notes (Signed)
Breakfast tray ordered 

## 2019-10-23 NOTE — Plan of Care (Signed)
Thank you for continuing to look after Christian [name], he is well-known to me due to act services, he drifts towards catatonia if we do not use lorazepam and modafinil when he is having this symptomatology so I will go ahead and order those- Would except on our floor if needed

## 2019-10-23 NOTE — ED Notes (Signed)
Pt ate dinner.

## 2019-10-23 NOTE — ED Notes (Signed)
Patient was given a Snack and Drink. 

## 2019-10-24 LAB — CBG MONITORING, ED
Glucose-Capillary: 155 mg/dL — ABNORMAL HIGH (ref 70–99)
Glucose-Capillary: 237 mg/dL — ABNORMAL HIGH (ref 70–99)

## 2019-10-24 MED ORDER — FUROSEMIDE 20 MG PO TABS: 20.0000 mg | ORAL_TABLET | Freq: Every day | ORAL | 0 refills | Status: DC

## 2019-10-24 MED ORDER — DIVALPROEX SODIUM 250 MG PO DR TAB: 250.0000 mg | DELAYED_RELEASE_TABLET | Freq: Two times a day (BID) | ORAL | 0 refills | Status: DC

## 2019-10-24 MED ORDER — ARIPIPRAZOLE 10 MG PO TABS: 10.0000 mg | ORAL_TABLET | Freq: Every day | ORAL | 0 refills | Status: DC

## 2019-10-24 MED ORDER — MODAFINIL 100 MG PO TABS: 100.0000 mg | ORAL_TABLET | Freq: Every day | ORAL | 1 refills | Status: DC

## 2019-10-24 NOTE — ED Notes (Signed)
Pt wheeled to car by this RN. Daughter and wife in car to take pt home.

## 2019-10-24 NOTE — ED Notes (Signed)
Pt dressed, D/C papers given to daughter. All questions answered prior to discharge. Daughter verbalized understanding of follow up and medication changes.

## 2019-10-24 NOTE — ED Notes (Signed)
Patient was given a snack and drink. A Diet was ordered for Lunch. 

## 2019-10-24 NOTE — ED Notes (Addendum)
Pt denies SI/HI. Pt does endorse visual and auditory hallucinations, states that it is happening now. Will not elaborate about what he is hearing or seeing. Pt states "I died a long time ago." Pt with flat affect and quiet. Pt had to be assisted to a sitting position to take medication. Pt did eat breakfast.

## 2019-10-24 NOTE — ED Notes (Signed)
Pt ambulated to restroom without difficulty or assistance.  

## 2019-10-24 NOTE — Discharge Instructions (Addendum)
Follow-up as recommended by the psychiatry team,

## 2019-10-24 NOTE — ED Notes (Signed)
Pt eating lunch

## 2019-10-24 NOTE — Progress Notes (Addendum)
CSW left HIPAA compliant voice message with pt's daughter Sharl Ma, requesting a return phone call to discuss disposition.   CSW will continue to attempt to communicate with [facility].   Audree Camel, LCSW, [place] Disposition CSW [facility] BHH/TTS [phone]   UPDATE: CSW spoke with pt's daughter and notified her that pt would be psychiatrically cleared today. She voiced understanding and will come to [facility] ED to pick her father up by 2pm.

## 2019-10-24 NOTE — ED Notes (Signed)
Daughter brought clothing for pt to wear.

## 2019-10-24 NOTE — ED Notes (Signed)
TTS set up at bedside. In progress.

## 2019-10-24 NOTE — Progress Notes (Signed)
Patient ID: Christian [name], male   DOB: 1951-03-01, 68 y.o.   MRN: [id number]   Reassessment   Christian [name] is an 68 y.o. male, who presented voluntary to [facility] ED 10/19/2019 accompanied by his daughter/legal guardian Christian [name], [phone].)  Per initial assessment, Pt's daughter reported, the pt is paranoid, jumpy, and on edge. Pt's daughter reported, when she came to the pt's house tonight he was slumped over with his tongue out, had slurred speech, his blood sugar was 379, had a fever of 101 and initially refused to come the to hospital EMS. Per daughter, the pt's ACT Team, dropped off medications today (10/18/2019) and she believes the pt took an extra dose of each. Pt reported, he took the correct dose. Pt's daughter reported, she counted the medications; for clonazepam there were 60 tablets and she counted 55. Per daughter, there were 30 tablets of aripiprazole prescribed but she counted 27 in the bottle. Pt then admitted to taking the extra medications and denied he was trying to kill himself. Per daughter, when trying to get the pt to got the hospital the pt was screaming, pushing, becoming agitated which is not normal. Pt denies, current SI, HI, AVH, self-injurious behaviors and access to weapons.   During this evaluation, patient states he is not suicidal or homicidal. He denies AVH and he does not appear internally preoccupied. He does however state," I am dead" when the evaluation begin. When I attempted to further explore this he stated," I am in a morgue" then later stated," I am in the hospital." There are some delusions still noted. Because I have not followed patient during his hospital course, I spoke with Dr. Jake Samples who has been following him and who is also patients provider through the ACT team. Per Dr. Sheppard Evens, patients is making some progress and showing some imprvement. Per Dr. Jake Samples, patient  drifts towards catatonia so  lorazepam and modafinil were ordered. Patient does not  appear catatonic at this time. Because patient is showing improvement and no catatnoia  present, Dr. Jake Samples believes that patient can be psychiatrically cleared and discharged with ACT continuing to follow his progress.   LCSW spoke with patient pt's daughter Christian [name] (see social worker note). Patients daughter was updated on current dispostion and advised LCSW that she would pick patient up at 2 :00 pm today.   EDP, Dr. Secundino Ginger updated on current disposition.

## 2019-10-24 NOTE — ED Provider Notes (Addendum)
Vitals:   10/23/19 2215 10/24/19 0756  BP: 99/63 122/74  Pulse: 77 97  Resp: 18 20  Temp: 97.7 F (36.5 C) 98.9 F (37.2 C)  SpO2: 97% 96%   Medically stable.  Pending psych disposition   Dorie Rank, MD 10/24/19 [phone]  Notified that patient is cleared from psychiatry for discharge.  Daughter will be picking the patient up today.   Dorie Rank, MD 10/24/19 1114

## 2019-10-31 ENCOUNTER — Emergency Department (HOSPITAL_COMMUNITY)
Admission: EM | Admit: 2019-10-31 | Discharge: 2019-11-02 | Disposition: A | Discharge status: Other Acute Inpatient Hospital | Payer: Medicare PPO | Attending: Emergency Medicine | Admitting: Emergency Medicine

## 2019-10-31 ENCOUNTER — Emergency Department (HOSPITAL_COMMUNITY): Payer: Medicare PPO

## 2019-10-31 ENCOUNTER — Other Ambulatory Visit: Payer: Self-pay

## 2019-10-31 DIAGNOSIS — F3131 Bipolar disorder, current episode depressed, mild: Secondary | ICD-10-CM | POA: Diagnosis present

## 2019-10-31 DIAGNOSIS — Z7984 Long term (current) use of oral hypoglycemic drugs: Secondary | ICD-10-CM | POA: Diagnosis not present

## 2019-10-31 DIAGNOSIS — R45851 Suicidal ideations: Secondary | ICD-10-CM | POA: Insufficient documentation

## 2019-10-31 DIAGNOSIS — F319 Bipolar disorder, unspecified: Secondary | ICD-10-CM | POA: Insufficient documentation

## 2019-10-31 DIAGNOSIS — I10 Essential (primary) hypertension: Secondary | ICD-10-CM | POA: Diagnosis not present

## 2019-10-31 DIAGNOSIS — Z79899 Other long term (current) drug therapy: Secondary | ICD-10-CM | POA: Diagnosis not present

## 2019-10-31 DIAGNOSIS — E119 Type 2 diabetes mellitus without complications: Secondary | ICD-10-CM | POA: Insufficient documentation

## 2019-10-31 DIAGNOSIS — F99 Mental disorder, not otherwise specified: Secondary | ICD-10-CM | POA: Diagnosis present

## 2019-10-31 DIAGNOSIS — Z20828 Contact with and (suspected) exposure to other viral communicable diseases: Secondary | ICD-10-CM | POA: Insufficient documentation

## 2019-10-31 DIAGNOSIS — R45850 Homicidal ideations: Secondary | ICD-10-CM | POA: Insufficient documentation

## 2019-10-31 DIAGNOSIS — R4589 Other symptoms and signs involving emotional state: Secondary | ICD-10-CM

## 2019-10-31 LAB — COMPREHENSIVE METABOLIC PANEL
ALT: 22 U/L (ref 0–44)
AST: 27 U/L (ref 15–41)
Albumin: 3.9 g/dL (ref 3.5–5.0)
Alkaline Phosphatase: 89 U/L (ref 38–126)
Anion gap: 15 (ref 5–15)
BUN: 37 mg/dL — ABNORMAL HIGH (ref 8–23)
CO2: 20 mmol/L — ABNORMAL LOW (ref 22–32)
Calcium: 9.3 mg/dL (ref 8.9–10.3)
Chloride: 96 mmol/L — ABNORMAL LOW (ref 98–111)
Creatinine, Ser: 2.31 mg/dL — ABNORMAL HIGH (ref 0.61–1.24)
GFR calc Af Amer: 33 mL/min — ABNORMAL LOW (ref >60)
GFR calc non Af Amer: 28 mL/min — ABNORMAL LOW (ref >60)
Glucose, Bld: 211 mg/dL — ABNORMAL HIGH (ref 70–99)
Potassium: 4.3 mmol/L (ref 3.5–5.1)
Sodium: 131 mmol/L — ABNORMAL LOW (ref 135–145)
Total Bilirubin: 0.7 mg/dL (ref 0.3–1.2)
Total Protein: 8.1 g/dL (ref 6.5–8.1)

## 2019-10-31 LAB — CBC WITH DIFFERENTIAL/PLATELET
Abs Immature Granulocytes: 0.01 10*3/uL (ref 0.00–0.07)
Basophils Absolute: 0 10*3/uL (ref 0.0–0.1)
Basophils Relative: 0 %
Eosinophils Absolute: 0.2 10*3/uL (ref 0.0–0.5)
Eosinophils Relative: 3 %
HCT: 49.6 % (ref 39.0–52.0)
Hemoglobin: 15.9 g/dL (ref 13.0–17.0)
Immature Granulocytes: 0 %
Lymphocytes Relative: 36 %
Lymphs Abs: 2 10*3/uL (ref 0.7–4.0)
MCH: 27.7 pg (ref 26.0–34.0)
MCHC: 32.1 g/dL (ref 30.0–36.0)
MCV: 86.3 fL (ref 80.0–100.0)
Monocytes Absolute: 0.6 10*3/uL (ref 0.1–1.0)
Monocytes Relative: 11 %
Neutro Abs: 2.6 10*3/uL (ref 1.7–7.7)
Neutrophils Relative %: 50 %
Platelets: 254 10*3/uL (ref 150–400)
RBC: 5.75 MIL/uL (ref 4.22–5.81)
RDW: 13.2 % (ref 11.5–15.5)
WBC: 5.4 10*3/uL (ref 4.0–10.5)
nRBC: 0 % (ref 0.0–0.2)

## 2019-10-31 LAB — ETHANOL: Alcohol, Ethyl (B): <10 mg/dL (ref <10)

## 2019-10-31 MED ORDER — ACETAMINOPHEN 325 MG PO TABS
650.0000 mg | ORAL_TABLET | Freq: Once | ORAL | Status: AC
  Administered 2019-11-01: 650 mg via ORAL
  Filled 2019-10-31: qty 2

## 2019-10-31 NOTE — ED Notes (Signed)
Daughter the guardian took all of his property with him.

## 2019-10-31 NOTE — Progress Notes (Signed)
10/31/2019  1920  Lab here to draw labs.

## 2019-10-31 NOTE — ED Notes (Signed)
Admitted to room 31 as writer starting shift. Lab done, and COVID test done. Waiting on results. Daughter here to visit who is his guardian, her name is Sharl Ma and the paper work is on his chart. She states he has been treated for mental illness for 40 years. Today worker from his ACTT came out to see him and he threatened to kill himself and the worker. It has been reported he is med non compliant lately. He told Probation officer when I went to meet him and due his COVID test his daughter didn't understand he was" already dead." Blunted affect, slow to respond, offers little. Refused inial order for food and drink.

## 2019-10-31 NOTE — BH Assessment (Signed)
Tele Assessment Note   Patient Name: Christian [name] MRN: [id number] Referring Physician: Dr. Melene Plan Location of Patient: [place] Location of Provider: Behavioral Health TTS Department  [name] is an 68 y.o. male presenting with complaints of SI and HI under IVC and accompanied by his daughter/legal guardian Christian [name] [phone]. Per daughter patients behaviors has gotten progressively worse over the past 2 weeks. Today an ACT team worker came out to see him and he threatened to kill himself and the worker. Patient shared with ACT worker "you are dead, unfortunately death is the only way out". Per daughter and staff patient has been doing this nervous scratching all over him. Patient continues to share that he is already dead. Patient has been treated for mental illness for the past 40 years. Patient denies abuse and substance use. Patient UDS is negative. Patient is currently being seen by Dr. Jeannine Kitten for medication management through his [facility].Patient reported not taking his medication because they are too strong.  Patient was last hospitalized for mental health at [facility] in 2019 for 2-3 months. Patient reported past suicide attempts during childhood years. Patient denied self-harming behaviors. Patient reported auditory and visual hallucinations during earlier years and denied present hallucinations. Patient reported increased depressive symptoms, feelings of hopelessness, guilt, crying spells, worthlessness and wanting to be alone. Patient was sad and cooperative during assessment.  Patient currently lives at a Independent Assisted Living Facility. Patient main support system is his only daughter.   De Blanch, daughter/legal guardian, [phone]. [name] shared additional information noted above.     Diagnosis: Bipolar 1 Disorder (HCC).  Past Medical History:  Past Medical History:  Diagnosis Date  . 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 Bipolar 1 disorder (HCC)   . Bipolar disorder (HCC)   . Chronic kidney disease   . Gout   . Hx of blood clots   . Hypertension   . Mood swings   . OSA on CPAP 06/02/2014  . Other and unspecified hyperlipidemia 06/02/2014  . Type II or unspecified type diabetes mellitus with unspecified complication, uncontrolled 06/02/2014    No past surgical history on file.  Family History:  Family History  Problem Relation Age of Onset  . Dementia Mother   . Arthritis Father   . Hypertension Father   . Mental illness Sister   . Diabetes Sister   . Heart failure Neg Hx   . Kidney failure Neg Hx   . Cancer Neg Hx     Social History:  reports that he has never smoked. He has never used smokeless tobacco. He reports that he does not drink alcohol or use drugs.  Additional Social History:  Alcohol / Drug Use Pain Medications: see MAR Prescriptions: see MAR Over the Counter: see MAR  CIWA: CIWA-Ar BP: 139/79 Pulse Rate: 97 COWS:    Allergies:  Allergies  Allergen Reactions  . Zyprexa [Olanzapine] Other (See Comments)    Allergy noted by ACT Team. No reaction specified (??)    Home Medications: (Not in a hospital admission)   OB/GYN Status:  No LMP for male patient.  General Assessment Data Location of Assessment: [facility] TTS Assessment: In system Is this a Tele or Face-to-Face Assessment?: Tele Assessment Is this an Initial Assessment or a Re-assessment for this encounter?: Initial Assessment Patient Accompanied by:: Other([name] Wynona Neat, daughter/legal guardian [phone]) Language Other than English: No Living Arrangements: In Group Home: (Comment: Name of  Group Home) What gender do you identify as?: Male Marital status: Divorced Living Arrangements: Alone([employer]) Can pt return to current living arrangement?: Yes Admission Status: Involuntary Petitioner: Family member Is patient capable of  signing voluntary admission?: (IVC) Referral Source: Self/Family/Friend     Crisis Care Plan Living Arrangements: Alone([employer]) Legal Guardian: [name], daughter/legal guardian [phone]) Name of Psychiatrist: Dr. Jake Samples.  Name of Therapist: [facility].  Education Status Is patient currently in school?: No Is the patient employed, unemployed or receiving disability?: Receiving disability income  Risk to self with the past 6 months Suicidal Ideation: Yes-Currently Present Has patient been a risk to self within the past 6 months prior to admission? : Yes Suicidal Intent: Yes-Currently Present Has patient had any suicidal intent within the past 6 months prior to admission? : No Is patient at risk for suicide?: Yes Suicidal Plan?: Yes-Currently Present Has patient had any suicidal plan within the past 6 months prior to admission? : No Specify Current Suicidal Plan: (patient refuse to share plan) Access to Means: (unknown) What has been your use of drugs/alcohol within the last 12 months?: (none) Previous Attempts/Gestures: Yes How many times?: (childhood) Other Self Harm Risks: (none reported) Triggers for Past Attempts: Unknown Intentional Self Injurious Behavior: None Family Suicide History: No Recent stressful life event(s): (mental illness) Persecutory voices/beliefs?: No Depression: Yes Depression Symptoms: Insomnia, Tearfulness, Isolating, Fatigue, Guilt, Loss of interest in usual pleasures, Feeling worthless/self pity, Feeling angry/irritable Substance abuse history and/or treatment for substance abuse?: No Suicide prevention information given to non-admitted patients: Not applicable  Risk to Others within the past 6 months Homicidal Ideation: No Does patient have any lifetime risk of violence toward others beyond the six months prior to admission? : No Thoughts of Harm to Others: No Current Homicidal Intent:  No Current Homicidal Plan: No Access to Homicidal Means: No History of harm to others?: No Assessment of Violence: None Noted Violent Behavior Description: (none) Does patient have access to weapons?: No Criminal Charges Pending?: No Does patient have a court date: No Is patient on probation?: No  Psychosis Hallucinations: Visual, Auditory(history) Delusions: None noted  Mental Status Report Appearance/Hygiene: Unremarkable Eye Contact: Fair Motor Activity: Freedom of movement Speech: Slow, Soft Level of Consciousness: Quiet/awake Mood: Depressed Affect: Flat, Depressed Anxiety Level: Minimal Thought Processes: Coherent, Relevant Judgement: Partial Orientation: Person, Time, Place, Situation Obsessive Compulsive Thoughts/Behaviors: None  Cognitive Functioning Concentration: Good Memory: Recent Intact Is patient IDD: No Insight: Poor Impulse Control: Poor Appetite: Poor Have you had any weight changes? : No Change Amount of the weight change? (lbs): (unknown) Sleep: Decreased Total Hours of Sleep: ("poor") Vegetative Symptoms: None  ADLScreening [facility] Assessment Services) Patient's cognitive ability adequate to safely complete daily activities?: Yes Patient able to express need for assistance with ADLs?: Yes Independently performs ADLs?: Yes (appropriate for developmental age)  Prior Inpatient Therapy Prior Inpatient Therapy: Yes Prior Therapy Dates: 2019. Prior Therapy Facilty/Provider(s): [facility].  Reason for Treatment: Bipolar Disorder.   Prior Outpatient Therapy Prior Outpatient Therapy: Yes Prior Therapy Dates: Current.  Prior Therapy Facilty/Provider(s): [facility].  Reason for Treatment: Medicationa management.  Does patient have an ACCT team?: Yes Does patient have Intensive In-House Services?  : No Does patient have [facility] services? : No Does patient have P4CC services?: No  ADL Screening (condition at time of  admission) Patient's cognitive ability adequate to safely complete daily activities?: Yes Patient able to express need for  assistance with ADLs?: Yes Independently performs ADLs?: Yes (appropriate for developmental age)   Disposition:  Disposition Initial Assessment Completed for this Encounter: Yes  Renaye Rakers, NP, patient meets inpatient criteria, geropsychiatry recommended. TTS to secure placement.    This service was provided via telemedicine using a 2-way, interactive audio and video technology.  Names of all persons participating in this telemedicine service and their role in this encounter. Name: Hardie Clamp Role: Patient  Name: Al Corpus Role: TTS Clinician  Name:  Role:   Name:  Role:     Burnetta Sabin 10/31/2019 9:25 PM

## 2019-10-31 NOTE — ED Provider Notes (Signed)
68 yo   [facility]   CSN: [id number] Arrival date & time: 10/31/19  1801     History   Chief Complaint No chief complaint on file.   HPI Christian [name] is a 68 y.o. male.     68 yo M with the chief complaints of suicidal and homicidal ideation.  The patient apparently threatened to kill one of the act team members and himself earlier today.  Police were called and the IVC paper was filled out.  Patient was then brought here for evaluation.  He is unwilling to discuss the event with me.  Level 5 caveat mental illness.  The history is provided by the patient and the police.  Illness Severity:  Moderate Onset quality:  Sudden Duration:  1 day Timing:  Constant Progression:  Unchanged Chronicity:  New Associated symptoms: no abdominal pain, no chest pain, no congestion, no diarrhea, no fever, no headaches, no myalgias, no rash, no shortness of breath and no vomiting     Past Medical History:  Diagnosis Date  . 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 Bipolar 1 disorder (HCC)   . Bipolar disorder (HCC)   . Chronic kidney disease   . Gout   . Hx of blood clots   . Hypertension   . Mood swings   . OSA on CPAP 06/02/2014  . Other and unspecified hyperlipidemia 06/02/2014  . Type II or unspecified type diabetes mellitus with unspecified complication, uncontrolled 06/02/2014    Patient Active Problem List   Diagnosis Date Noted  . Encephalopathy   . Paroxysmal atrial fibrillation (HCC) 06/18/2018  . History of pulmonary embolism 06/17/2018  . Generalized weakness 03/27/2018  . Diabetes mellitus type 2 in nonobese (HCC) 03/27/2018  . Pulmonary embolism (HCC) 03/27/2018  . Fall   . Laceration of eyebrow   . Bipolar I disorder, most recent episode depressed, severe w psychosis (HCC) 12/22/2015  . Bipolar affective disorder, depressed, severe, with psychotic behavior (HCC)   . Bipolar I  disorder, current or most recent episode depressed, with psychotic features (HCC)   . Bipolar I disorder, most recent episode depressed (HCC)   . Severe bipolar I disorder with depression (HCC)   . Bipolar I disorder, most recent episode depressed, severe with psychotic features (HCC)   . Overdose 09/11/2015  . Acute respiratory failure with hypoxia (HCC) 09/05/2015  . Elevated troponin 09/05/2015  . Somnolence 09/05/2015  . Bipolar depression (HCC) 09/05/2015  . Acute bilateral deep vein thrombosis (DVT) of femoral veins (HCC) 09/05/2015  . Bilateral pulmonary embolism (HCC) 09/02/2015  . Labile hypertension   . Pulmonary embolism with acute cor pulmonale (HCC)   . Dyspnea   . Orthostatic hypotension   . Bipolar disorder, in partial remission, most recent episode depressed (HCC)   . Syncope, near 08/31/2015  . Affective psychosis, bipolar (HCC)   . Bipolar affective disorder, depressed, mild (HCC) 08/28/2015  . Pressure ulcer 07/30/2015  . Protein-calorie malnutrition, severe (HCC) 07/30/2015  . Dehydration 07/29/2015  . Diabetes (HCC) 07/09/2015  . UTI (urinary tract infection) 06/19/2014  . Positive blood culture 06/19/2014  . Type 2 diabetes mellitus with complication, without long-term current use of insulin (HCC) 06/02/2014  . Other and unspecified hyperlipidemia 06/02/2014  . 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 Gout 06/02/2014  . Hypertension     No past surgical history on file.      Home  Medications    Prior to Admission medications   Medication Sig Start Date End Date Taking? Authorizing Provider  acetaminophen (TYLENOL) 325 MG tablet Take 2 tablets (650 mg total) by mouth every 6 (six) hours as needed for mild pain (or Fever >/= 101). 06/23/18  Yes Alwyn Ren, MD  ARIPiprazole (ABILIFY) 5 MG tablet Take 5 mg by mouth daily.   Yes [provider]  atorvastatin (LIPITOR) 10 MG tablet Take 10 mg by  mouth every evening.    Yes [provider]  clonazePAM (KLONOPIN) 0.5 MG tablet Take 0.5 mg by mouth 2 (two) times daily.   Yes [provider]  divalproex (DEPAKOTE) 125 MG DR tablet Take 125 mg by mouth 2 (two) times daily.   Yes [provider]  furosemide (LASIX) 20 MG tablet Take 1 tablet (20 mg total) by mouth daily. 10/24/19  Yes Linwood Dibbles, MD  Insulin Glargine (LANTUS SOLOSTAR) 100 UNIT/ML Solostar Pen Inject 34 Units into the skin daily with breakfast.   Yes [provider]  liothyronine (CYTOMEL) 25 MCG tablet Take 25 mcg by mouth daily before breakfast.    Yes [provider]  lisinopril (ZESTRIL) 20 MG tablet Take 20 mg by mouth daily with breakfast.   Yes [provider]  LORazepam (ATIVAN) 0.5 MG tablet Take 1 tablet (0.5 mg total) by mouth 3 (three) times daily. 10/23/19  Yes Malvin Johns, MD  metFORMIN (GLUCOPHAGE-XR) 500 MG 24 hr tablet Take 1,000 mg by mouth daily with breakfast.    Yes [provider]  metoprolol succinate (TOPROL-XL) 100 MG 24 hr tablet Take 100 mg by mouth daily. Take with or immediately following a meal.    Yes [provider]  modafinil (PROVIGIL) 100 MG tablet Take 1 tablet (100 mg total) by mouth daily. 10/24/19  Yes Linwood Dibbles, MD  rivaroxaban (XARELTO) 20 MG TABS tablet Take 1 tablet (20 mg total) by mouth daily with supper. 06/23/18  Yes Alwyn Ren, MD  ARIPiprazole (ABILIFY) 10 MG tablet Take 1 tablet (10 mg total) by mouth daily. Patient not taking: Reported on 10/31/2019 10/24/19   Linwood Dibbles, MD  divalproex (DEPAKOTE) 250 MG DR tablet Take 1 tablet (250 mg total) by mouth 2 (two) times daily. Patient not taking: Reported on 10/31/2019 10/24/19   Linwood Dibbles, MD  docusate sodium (COLACE) 100 MG capsule Take 1 capsule (100 mg total) by mouth 2 (two) times daily. Patient not taking: Reported on 10/19/2019 06/23/18   Alwyn Ren, MD  insulin aspart (NOVOLOG) 100  UNIT/ML injection Inject 0-9 Units into the skin 3 (three) times daily with meals. Patient not taking: Reported on 10/19/2019 06/23/18   Alwyn Ren, MD    Family History Family History  Problem Relation Age of Onset  . Dementia Mother   . Arthritis Father   . Hypertension Father   . Mental illness Sister   . Diabetes Sister   . Heart failure Neg Hx   . Kidney failure Neg Hx   . Cancer Neg Hx     Social History Social History   Tobacco Use  . Smoking status: Never Smoker  . Smokeless tobacco: Never Used  Substance Use Topics  . Alcohol use: No  . Drug use: No     Allergies   Zyprexa [olanzapine]   Review of Systems Review of Systems  Unable to perform ROS: Psychiatric disorder  Constitutional: Negative for chills and fever.  HENT: Negative for congestion and facial  swelling.   Eyes: Negative for discharge and visual disturbance.  Respiratory: Negative for shortness of breath.   Cardiovascular: Negative for chest pain and palpitations.  Gastrointestinal: Negative for abdominal pain, diarrhea and vomiting.  Musculoskeletal: Negative for arthralgias and myalgias.  Skin: Negative for color change and rash.  Neurological: Negative for tremors, syncope and headaches.  Psychiatric/Behavioral: Negative for confusion and dysphoric mood.     Physical Exam Updated Vital Signs BP 139/79 (BP Location: Right Arm)   Pulse 97   Temp 98.3 F (36.8 C) (Oral)   Resp 16   SpO2 97%   Physical Exam Vitals signs and nursing note reviewed.  Constitutional:      Appearance: He is well-developed. He is obese.  HENT:     Head: Normocephalic and atraumatic.  Eyes:     Pupils: Pupils are equal, round, and reactive to light.  Neck:     Musculoskeletal: Normal range of motion and neck supple.     Vascular: No JVD.  Cardiovascular:     Rate and Rhythm: Normal rate and regular rhythm.     Heart sounds: No murmur. No friction rub. No gallop.   Pulmonary:     Effort: No  respiratory distress.     Breath sounds: No wheezing.  Abdominal:     General: There is no distension.     Tenderness: There is no abdominal tenderness. There is no guarding or rebound.  Musculoskeletal: Normal range of motion.  Skin:    Coloration: Skin is not pale.     Findings: No rash.  Neurological:     Mental Status: He is alert and oriented to person, place, and time.  Psychiatric:        Mood and Affect: Affect is blunt and flat.        Behavior: Behavior normal.      ED Treatments / Results  Labs (all labs ordered are listed, but only abnormal results are displayed) Labs Reviewed  COMPREHENSIVE METABOLIC PANEL - Abnormal; Notable for the following components:      Result Value   Sodium 131 (*)    Chloride 96 (*)    CO2 20 (*)    Glucose, Bld 211 (*)    BUN 37 (*)    Creatinine, Ser 2.31 (*)    GFR calc non Af Amer 28 (*)    GFR calc Af Amer 33 (*)    All other components within normal limits  SARS CORONAVIRUS 2 (TAT 6-24 HRS)  ETHANOL  CBC WITH DIFFERENTIAL/PLATELET  RAPID URINE DRUG SCREEN, HOSP PERFORMED    EKG None  Radiology Dg Chest Port 1 View  Result Date: 10/31/2019 CLINICAL DATA:  Suicidal behavior EXAM: PORTABLE CHEST 1 VIEW COMPARISON:  08/25/2018 FINDINGS: The heart size and mediastinal contours are within normal limits. Both lungs are clear. The visualized skeletal structures are unremarkable. IMPRESSION: No active disease.  Low lung volumes Electronically Signed   By: Donavan Foil M.D.   On: 10/31/2019 19:53    Procedures Procedures (including critical care time)  Medications Ordered in ED Medications - No data to display   Initial Impression / Assessment and Plan / ED Course  I have reviewed the triage vital signs and the nursing notes.  Pertinent labs & imaging results that were available during my care of the patient were reviewed by me and considered in my medical decision making (see chart for details).        68 yo M here  for suicidal and  homicidal ideation.  This apparently occurred in front of his act team member.  They felt that he needs to be sent to the ED for evaluation.  Patient is unwilling to discuss this with me.  He is likely medically clear as he recently had lab work performed and is not complaining of any medical complaints.  We will have TTS evaluate.  Mild renal dysfunction from baseline, no anemia.  Push oral fluids.   CXR without pna. Psych dispo pending.   The patients results and plan were reviewed and discussed.   Any x-rays performed were independently reviewed by myself.   Differential diagnosis were considered with the presenting HPI.  Medications - No data to display  Vitals:   10/31/19 1819  BP: 139/79  Pulse: 97  Resp: 16  Temp: 98.3 F (36.8 C)  TempSrc: Oral  SpO2: 97%    Final diagnoses:  Suicidal behavior     Final Clinical Impressions(s) / ED Diagnoses   Final diagnoses:  Suicidal behavior    ED Discharge Orders    None       Melene PlanFloyd, Demia Viera, DO 10/31/19 2334

## 2019-10-31 NOTE — Care Management (Signed)
Under review:  [place], Cyndra Numbers, [place], [place], [place], [place], [place], [place], [place], [place], [place], [place], [place], [employer], [place], [place], [place], [place], [place], [place], [place], [place], [place], [place], music, [place], [place], [place], [place], [place], [place]

## 2019-10-31 NOTE — Progress Notes (Addendum)
Received Christian [name] asleep in his room with the sitter at the bedside. He woke up later and requested a snack. He continues to endorse he is dead and had to be encouraged to have his B/P check. He slept throughout  the night.

## 2019-10-31 NOTE — Care Management (Signed)
Patient is on the wait list at [facility].

## 2019-11-01 LAB — URINALYSIS, ROUTINE W REFLEX MICROSCOPIC
Bilirubin Urine: NEGATIVE
Glucose, UA: 50 mg/dL — AB
Ketones, ur: NEGATIVE mg/dL
Nitrite: NEGATIVE
Protein, ur: NEGATIVE mg/dL
Specific Gravity, Urine: 1.01 (ref 1.005–1.030)
pH: 5 (ref 5.0–8.0)

## 2019-11-01 LAB — SARS CORONAVIRUS 2 (TAT 6-24 HRS): SARS Coronavirus 2: NEGATIVE

## 2019-11-01 LAB — RAPID URINE DRUG SCREEN, HOSP PERFORMED
Amphetamines: NOT DETECTED
Barbiturates: NOT DETECTED
Benzodiazepines: NOT DETECTED
Cocaine: NOT DETECTED
Opiates: NOT DETECTED
Tetrahydrocannabinol: NOT DETECTED

## 2019-11-01 MED ORDER — ARIPIPRAZOLE 5 MG PO TABS
5.0000 mg | ORAL_TABLET | Freq: Every day | ORAL | Status: DC
  Administered 2019-11-01 – 2019-11-02 (×2): 5 mg via ORAL
  Filled 2019-11-01 (×2): qty 1

## 2019-11-01 MED ORDER — ACETAMINOPHEN 325 MG PO TABS
650.0000 mg | ORAL_TABLET | ORAL | Status: DC | PRN
  Administered 2019-11-01: 650 mg via ORAL
  Filled 2019-11-01: qty 2

## 2019-11-01 MED ORDER — DIVALPROEX SODIUM 125 MG PO DR TAB
125.0000 mg | DELAYED_RELEASE_TABLET | Freq: Two times a day (BID) | ORAL | Status: DC
  Administered 2019-11-01 – 2019-11-02 (×3): 125 mg via ORAL
  Filled 2019-11-01 (×4): qty 1

## 2019-11-01 MED ORDER — LORAZEPAM 0.5 MG PO TABS
0.5000 mg | ORAL_TABLET | Freq: Three times a day (TID) | ORAL | Status: DC
  Administered 2019-11-01 – 2019-11-02 (×4): 0.5 mg via ORAL
  Filled 2019-11-01 (×4): qty 1

## 2019-11-01 NOTE — ED Notes (Signed)
Pt took his medication without difficulty.  He was able to drink a cup of water as well.

## 2019-11-01 NOTE — Consult Note (Signed)
Telepsych Consultation   Reason for Consult:  Suicidal and Homicidal Ideations Referring Physician:  EDP Location of Patient:  Location of Provider: [facility]  Patient Identification: Christian [name] MRN:  [id number] Principal Diagnosis: Bipolar affective disorder, depressed, mild (HCC) Diagnosis:  Principal Problem:   Bipolar affective disorder, depressed, mild (HCC)   Total Time spent with patient: 30 minutes  Subjective:   Christian [name] is a 68 y.o. male patient admitted with homicidal ideations toward act team member.  Patient alert but confused today, unable to participate in assessment.  Patient appears confused, oriented to self only at this time.  When questioned patient reports "I am way past suicidal."  Patient denies homicidal ideations today, threatened act team staff member on yesterday.  Patient denies hallucinations, does not appear to be responding to internal stimuli.  Patient ended interview prior to completion.  Patient states "I am dead ma'am." Patient seen along with Dr. Lucianne Muss who recommends inpatient treatment.  HPI:  Per TTS assessment: Christian [name] is an 68 y.o. male presenting with complaints of SI and HI under IVC and accompanied by his daughter/legal guardian [name] [phone]. Per daughter patients behaviors has gotten progressively worse over the past 2 weeks. Today an ACT team worker came out to see him and he threatened to kill himself and the worker.  Past Psychiatric History: Bipolar affective disorder  Risk to Self: Suicidal Ideation: Yes-Currently Present Suicidal Intent: Yes-Currently Present Is patient at risk for suicide?: Yes Suicidal Plan?: Yes-Currently Present Specify Current Suicidal Plan: (patient refuse to share plan) Access to Means: (unknown) What has been your use of drugs/alcohol within the last 12 months?: (none) How many times?: (childhood) Other Self Harm Risks: (none reported) Triggers for Past  Attempts: Unknown Intentional Self Injurious Behavior: None Risk to Others: Homicidal Ideation: No Thoughts of Harm to Others: No Current Homicidal Intent: No Current Homicidal Plan: No Access to Homicidal Means: No History of harm to others?: No Assessment of Violence: None Noted Violent Behavior Description: (none) Does patient have access to weapons?: No Criminal Charges Pending?: No Does patient have a court date: No Prior Inpatient Therapy: Prior Inpatient Therapy: Yes Prior Therapy Dates: 2019. Prior Therapy Facilty/Provider(s): [facility].  Reason for Treatment: Bipolar Disorder.  Prior Outpatient Therapy: Prior Outpatient Therapy: Yes Prior Therapy Dates: Current.  Prior Therapy Facilty/Provider(s): [facility].  Reason for Treatment: Medicationa management.  Does patient have an ACCT team?: Yes Does patient have Intensive In-House Services?  : No Does patient have [facility] services? : No Does patient have P4CC services?: No  Past Medical History:  Past Medical History:  Diagnosis Date  . 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 Bipolar 1 disorder (HCC)   . Bipolar disorder (HCC)   . Chronic kidney disease   . Gout   . Hx of blood clots   . Hypertension   . Mood swings   . OSA on CPAP 06/02/2014  . Other and unspecified hyperlipidemia 06/02/2014  . Type II or unspecified type diabetes mellitus with unspecified complication, uncontrolled 06/02/2014   No past surgical history on file. Family History:  Family History  Problem Relation Age of Onset  . Dementia Mother   . Arthritis Father   . Hypertension Father   . Mental illness Sister   . Diabetes Sister   . Heart failure Neg Hx   . Kidney failure Neg Hx   . Cancer Neg Hx  Family Psychiatric  History: Unknown Social History:  Social History   Substance and Sexual Activity  Alcohol Use No     Social History   Substance and Sexual  Activity  Drug Use No    Social History   Socioeconomic History  . Marital status: Divorced    Spouse name: Not on file  . Number of children: Not on file  . Years of education: Not on file  . Highest education level: Not on file  Occupational History  . Not on file  Social Needs  . Financial resource strain: Not on file  . Food insecurity    Worry: Not on file    Inability: Not on file  . Transportation needs    Medical: Not on file    Non-medical: Not on file  Tobacco Use  . Smoking status: Never Smoker  . Smokeless tobacco: Never Used  Substance and Sexual Activity  . Alcohol use: No  . Drug use: No  . Sexual activity: Not Currently  Lifestyle  . Physical activity    Days per week: Not on file    Minutes per session: Not on file  . Stress: Not on file  Relationships  . Social Musician on phone: Not on file    Gets together: Not on file    Attends religious service: Not on file    Active member of club or organization: Not on file    Attends meetings of clubs or organizations: Not on file    Relationship status: Not on file  Other Topics Concern  . Not on file  Social History Narrative   *The patient has been married for over 30 years and is currently on disability. He lives with his wife in the [place] area and has one daughter.             Additional Social History:    Allergies:   Allergies  Allergen Reactions  . Zyprexa [Olanzapine] Other (See Comments)    Allergy noted by ACT Team. No reaction specified (??)    Labs:  Results for orders placed or performed during the hospital encounter of 10/31/19 (from the past 48 hour(s))  Comprehensive metabolic panel     Status: Abnormal   Collection Time: 10/31/19  7:25 PM  Result Value Ref Range   Sodium 131 (L) 135 - 145 mmol/L   Potassium 4.3 3.5 - 5.1 mmol/L   Chloride 96 (L) 98 - 111 mmol/L   CO2 20 (L) 22 - 32 mmol/L   Glucose, Bld 211 (H) 70 - 99 mg/dL   BUN 37 (H) 8 - 23 mg/dL    Creatinine, Ser 8.52 (H) 0.61 - 1.24 mg/dL   Calcium 9.3 8.9 - 77.8 mg/dL   Total Protein 8.1 6.5 - 8.1 g/dL   Albumin 3.9 3.5 - 5.0 g/dL   AST 27 15 - 41 U/L   ALT 22 0 - 44 U/L   Alkaline Phosphatase 89 38 - 126 U/L   Total Bilirubin 0.7 0.3 - 1.2 mg/dL   GFR calc non Af Amer 28 (L) >60 mL/min   GFR calc Af Amer 33 (L) >60 mL/min   Anion gap 15 5 - 15    Comment: Performed at [facility], [address], [place], [place] [address]  Ethanol     Status: None   Collection Time: 10/31/19  7:25 PM  Result Value Ref Range   Alcohol, Ethyl (B) <10 <10 mg/dL  Comment: (NOTE) Lowest detectable limit for serum alcohol is 10 mg/dL. For medical purposes only. Performed at [facility], [address] [place], [place] [address]   CBC with Diff     Status: None   Collection Time: 10/31/19  7:25 PM  Result Value Ref Range   WBC 5.4 4.0 - 10.5 K/uL   RBC 5.75 4.22 - 5.81 MIL/uL   Hemoglobin 15.9 13.0 - 17.0 g/dL   HCT 60.449.6 54.039.0 - 98.152.0 %   MCV 86.3 80.0 - 100.0 fL   MCH 27.7 26.0 - 34.0 pg   MCHC 32.1 30.0 - 36.0 g/dL   RDW 19.113.2 47.811.5 - 29.515.5 %   Platelets 254 150 - 400 K/uL   nRBC 0.0 0.0 - 0.2 %   Neutrophils Relative % 50 %   Neutro Abs 2.6 1.7 - 7.7 K/uL   Lymphocytes Relative 36 %   Lymphs Abs 2.0 0.7 - 4.0 K/uL   Monocytes Relative 11 %   Monocytes Absolute 0.6 0.1 - 1.0 K/uL   Eosinophils Relative 3 %   Eosinophils Absolute 0.2 0.0 - 0.5 K/uL   Basophils Relative 0 %   Basophils Absolute 0.0 0.0 - 0.1 K/uL   Immature Granulocytes 0 %   Abs Immature Granulocytes 0.01 0.00 - 0.07 K/uL    Comment: Performed at [facility], [address] [place], [place] [address]    Medications:  No current facility-administered medications for this encounter.    Current Outpatient Medications  Medication Sig Dispense Refill  . acetaminophen (TYLENOL) 325 MG tablet Take 2 tablets (650 mg total) by mouth every 6 (six) hours as  needed for mild pain (or Fever >/= 101).    . ARIPiprazole (ABILIFY) 5 MG tablet Take 5 mg by mouth daily.    Marland Kitchen. atorvastatin (LIPITOR) 10 MG tablet Take 10 mg by mouth every evening.     . clonazePAM (KLONOPIN) 0.5 MG tablet Take 0.5 mg by mouth 2 (two) times daily.    . divalproex (DEPAKOTE) 125 MG DR tablet Take 125 mg by mouth 2 (two) times daily.    . furosemide (LASIX) 20 MG tablet Take 1 tablet (20 mg total) by mouth daily. 30 tablet 0  . Insulin Glargine (LANTUS SOLOSTAR) 100 UNIT/ML Solostar Pen Inject 34 Units into the skin daily with breakfast.    . liothyronine (CYTOMEL) 25 MCG tablet Take 25 mcg by mouth daily before breakfast.     . lisinopril (ZESTRIL) 20 MG tablet Take 20 mg by mouth daily with breakfast.    . LORazepam (ATIVAN) 0.5 MG tablet Take 1 tablet (0.5 mg total) by mouth 3 (three) times daily. 30 tablet 0  . metFORMIN (GLUCOPHAGE-XR) 500 MG 24 hr tablet Take 1,000 mg by mouth daily with breakfast.     . metoprolol succinate (TOPROL-XL) 100 MG 24 hr tablet Take 100 mg by mouth daily. Take with or immediately following a meal.     . modafinil (PROVIGIL) 100 MG tablet Take 1 tablet (100 mg total) by mouth daily. 30 tablet 1  . rivaroxaban (XARELTO) 20 MG TABS tablet Take 1 tablet (20 mg total) by mouth daily with supper. 30 tablet 1  . ARIPiprazole (ABILIFY) 10 MG tablet Take 1 tablet (10 mg total) by mouth daily. (Patient not taking: Reported on 10/31/2019) 30 tablet 0  . divalproex (DEPAKOTE) 250 MG DR tablet Take 1 tablet (250 mg total) by mouth 2 (two) times daily. (Patient not taking: Reported on 10/31/2019) 60  tablet 0  . docusate sodium (COLACE) 100 MG capsule Take 1 capsule (100 mg total) by mouth 2 (two) times daily. (Patient not taking: Reported on 10/19/2019) 10 capsule 0  . insulin aspart (NOVOLOG) 100 UNIT/ML injection Inject 0-9 Units into the skin 3 (three) times daily with meals. (Patient not taking: Reported on 10/19/2019) 10 mL 11    Musculoskeletal: Strength  & Muscle Tone: Unable to assess Gait & Station: Unable to assess Patient leans: Unable to assess  Psychiatric Specialty Exam: Physical Exam  Nursing note and vitals reviewed. Constitutional: He appears well-developed.  HENT:  Head: Normocephalic.  Cardiovascular: Normal rate.  Respiratory: Effort normal.  Neurological: He is alert.    Review of Systems  Constitutional: Negative.   HENT: Negative.   Eyes: Negative.   Respiratory: Negative.   Cardiovascular: Negative.   Gastrointestinal: Negative.   Genitourinary: Negative.   Musculoskeletal: Negative.   Skin: Negative.   Neurological: Negative.   Endo/Heme/Allergies: Negative.   Psychiatric/Behavioral: Positive for suicidal ideas.    Blood pressure (!) 143/81, pulse 78, temperature 98.4 F (36.9 C), temperature source Oral, resp. rate 16, SpO2 99 %.There is no height or weight on file to calculate BMI.  General Appearance: Disheveled  Eye Contact:  None  Speech:  Garbled  Volume:  Decreased  Mood:  Irritable  Affect:  Non-Congruent  Thought Process:  Disorganized and Descriptions of Associations: Loose  Orientation:  Other:  self only  Thought Content:  Illogical, Obsessions and Paranoid Ideation  Suicidal Thoughts:  Yes.  without intent/plan  Homicidal Thoughts:  No  Memory:  Immediate;   Poor Recent;   Poor Remote;   Poor  Judgement:  Poor  Insight:  Lacking  Psychomotor Activity:  Normal  Concentration:  Concentration: Poor and Attention Span: Poor  Recall:  Poor  Fund of Knowledge:  Fair  Language:  Fair  Akathisia:  No  Handed:  Right  AIMS (if indicated):     Assets:  Communication Skills Desire for Improvement Financial Resources/Insurance Housing Social Support  ADL's:  Impaired  Cognition:  Impaired,  Mild  Sleep:        Treatment Plan Summary: Daily contact with patient to assess and evaluate symptoms and progress in treatment  Disposition: Recommend psychiatric Inpatient admission when  medically cleared. Supportive therapy provided about ongoing stressors.  This service was provided via telemedicine using a 2-way, interactive audio and video technology.  Names of all persons participating in this telemedicine service and their role in this encounter. Name: Christian [name] Role: Patient  Name: Letitia Libra Role: Weiner, Roberts 11/01/2019 11:43 AM

## 2019-11-01 NOTE — BH Assessment (Signed)
Patient accepted to [facility], per [facility] [phone]. The details of the acceptance will be given after their physician reviews his medical information. Langley Gauss will contact patient's nurse at [facility] with  details of his acceptance (nurse report number, accepting provider, etc.)

## 2019-11-01 NOTE — BH Assessment (Signed)
[place] Assessment Progress Note  At 15:24 this writer called pt's daughter/legal guardian, [name] [phone]), to notify her of pt's current disposition. I informed her that pt's IVC has been upheld and that placement is currently being sought for him in a psychiatric facility providing specialty care for geriatric patients.  I also informed her that the behavioral health team will be working to stabilize him in the meantime, and that if this occurs before psychiatric placement is found for him, that he will be ready to discharge from the ED.  She understands this, but wants him to be hospitalized.  She has no objection to pt being admitted to any facility in [place] that agrees to accept him.  Ms Jenean Lindau reports that she is intimately involved in pt's care, which over the past month has involved bathing him and assisting with other ADL's.  In her opinion, this is not the result of any physical impairment, but rather, a lack of motivation and cooperation with self care.  She reports that pt has had non-specific episodes of decompensation in the past, but never as he is now with respect to self care.  Pt has not seen his primary care for many months, and he has not seen a neurologist.  When I informed her that pt's ACT Team asserts that pt is non-compliant with medications, she reports that what they mean is that they have not been administering pt's medications.  She asserts that she has been giving them to the pt.  When asked about recent stressors, Ms Jenean Lindau reports that pt's father died in Apr 22, 2023 of the current year.  She also reports that he was at [facility] ED as a patient from 10/16/2019 - 10/24/2019, where he was reportedly diagnosed and treatment for a UTI.  Clearing the UTI, however, did not resolve all of his problems, although pt's orientation did reportedly improve.  This is part of the reason that she is eager for pt to be admitted to a psychiatric facility.  These details have  been staffed with Letitia Libra, FNP.  This Probation officer will continue seeking placement for pt.  As of this writing, final disposition is pending.  Jalene Mullet, [place] Coordinator [phone]

## 2019-11-01 NOTE — ED Notes (Signed)
Pt is incontinent of urine.  While changing and cleaning patient 3 small skin tears were noted to back.  Pt was cleaned and dried and encouraged to get up to chair.  Pt is not responding to [place].  He is able to speak but does not choose to.

## 2019-11-01 NOTE — Progress Notes (Signed)
Received Christian [name] at the change of shift awake in his bed with the sitter at the bedside.Later he received a snack of his choice and was compliant with his night time medications.He was incontinent of urine x1  and reminded to use the urinal. He will be transported to [facility] in [place] after 0900 hrs. The accepting doctor is Dr. Jake Bathe. He will be going to the [facility] and the number to call report is [phone]. He slept throughout the night and was incontinent of urine this morning.

## 2019-11-01 NOTE — BH Assessment (Signed)
[place] Assessment Progress Note  Per Hampton Abbot, MD, this pt requires psychiatric hospitalization at this time at a facility providing specialty geriatric care.  Pt presents under IVC initiated by pt's ACT Team which Dr Dwyane Dee has upheld.  The following facilities have been contacted to seek placement for this pt, with results as noted:  Beds available, information sent, decision pending: [facility]: [facility] (too acute for milieu at this time)  At capacity: [facility] (unit currently closed for renovation)  Unable to reach: [place]  Not referred: [facility] (care needs too great for program) [facility] (care needs too great for program)   Jalene Mullet, [place] Coordinator [phone]

## 2019-11-02 LAB — VALPROIC ACID LEVEL: Valproic Acid Lvl: 11 ug/mL — ABNORMAL LOW (ref 50.0–100.0)

## 2019-11-02 NOTE — ED Notes (Addendum)
Pt changed into his clean clothing and resting in recliner. He continues to disoriented and anxious. He is able to walk, but unsteady.

## 2019-11-02 NOTE — ED Notes (Signed)
Transported to [facility] in [place] by [facility]. Pt's guardian was present, [name] [phone], to assist with the transfer to the sheriff, to promote trust.

## 2019-11-02 NOTE — ED Notes (Signed)
[facility] charges for transports over 50 miles. Medicare and/or Medicaid will not pay. [name] [phone] is calling [facility] to see what arrangements can be made.

## 2019-11-02 NOTE — ED Notes (Signed)
Pt is passive and seems to want to be babied. Took his pills after this writer put them to his mouth.  Drank 2 cups of water after this writer held the straw to his mouth. Does feed himself.  Staff got him to stand up and transfer to a recliner. Plan is for [facility] to arrive around 2 pm and pt's daughter plans to be here around the same time to assist with getting him into the wheelchair.

## 2019-11-02 NOTE — ED Notes (Signed)
Now that pt is dressed he wants to get up and go. Staff redirect him to stay seated in the recliner.

## 2019-11-02 NOTE — ED Provider Notes (Addendum)
68 yo male evaluated for SI and HI seen and medically cleared.  Patient has had a care facility.  His act team evaluated him and he became aggressive towards the counselor. [facility] team advised admission Patient accepted for admission at [facility] in [place] Patient hemodynamically stable here Creatinine 2.3 and has been elvated previously, but has also decreased intermittently- will need to have trended Known diabetic with elevated bs in 200s Will need to have diabetic diet Encourage water intake Recheck creatinine within next week.  9:10 AM Daughter here and states that patient can walk at baseline but is sometimes not cooperative and has had some deconditioning.  She states that he would not get up and walk originally when he was being brought in voluntarily.  However after the IVC was signed, he was able to walk to the gurney. Daughter states that this is similar to multiple prior episodes of behavioral health crises.  She states that they become more frequent recently.  Depakote level ordered  Informed by [facility] that Dr. Sheppard Evens, patient's act team doc, states this "what he looks like" when his depression worsens.  Pattricia Boss, MD 11/02/19 1478    Pattricia Boss, MD 11/02/19 [phone]

## 2019-12-19 ENCOUNTER — Telehealth: Payer: Self-pay

## 2019-12-24 ENCOUNTER — Other Ambulatory Visit (HOSPITAL_COMMUNITY)
Admission: RE | Admit: 2019-12-24 | Discharge: 2019-12-24 | Disposition: A | Discharge status: Home / Self Care | Payer: Medicare PPO | Source: Ambulatory Visit | Attending: Psychiatry | Admitting: Psychiatry

## 2019-12-24 ENCOUNTER — Other Ambulatory Visit (HOSPITAL_COMMUNITY): Admission: RE | Admit: 2019-12-24 | Payer: Medicare PPO | Source: Ambulatory Visit

## 2019-12-24 DIAGNOSIS — Z01812 Encounter for preprocedural laboratory examination: Secondary | ICD-10-CM | POA: Insufficient documentation

## 2019-12-24 DIAGNOSIS — Z20822 Contact with and (suspected) exposure to covid-19: Secondary | ICD-10-CM | POA: Insufficient documentation

## 2019-12-24 LAB — SARS CORONAVIRUS 2 (TAT 6-24 HRS): SARS Coronavirus 2: NEGATIVE

## 2019-12-25 ENCOUNTER — Other Ambulatory Visit: Payer: Self-pay | Admitting: Psychiatry

## 2019-12-26 ENCOUNTER — Other Ambulatory Visit: Payer: Self-pay

## 2019-12-26 ENCOUNTER — Ambulatory Visit: Payer: Self-pay | Admitting: Anesthesiology

## 2019-12-26 ENCOUNTER — Encounter
Admission: RE | Admit: 2019-12-26 | Discharge: 2019-12-26 | Disposition: A | Discharge status: Home / Self Care | Payer: Medicare PPO | Source: Ambulatory Visit | Attending: Psychiatry | Admitting: Psychiatry

## 2019-12-26 ENCOUNTER — Telehealth: Payer: Self-pay

## 2019-12-26 ENCOUNTER — Encounter: Payer: Self-pay | Admitting: Anesthesiology

## 2019-12-26 ENCOUNTER — Ambulatory Visit
Admission: RE | Admit: 2019-12-26 | Discharge: 2019-12-26 | Disposition: A | Discharge status: Home / Self Care | Payer: Medicare PPO | Source: Ambulatory Visit | Attending: Anesthesiology | Admitting: Anesthesiology

## 2019-12-26 DIAGNOSIS — N189 Chronic kidney disease, unspecified: Secondary | ICD-10-CM | POA: Insufficient documentation

## 2019-12-26 DIAGNOSIS — Z882 Allergy status to sulfonamides status: Secondary | ICD-10-CM | POA: Diagnosis not present

## 2019-12-26 DIAGNOSIS — E119 Type 2 diabetes mellitus without complications: Secondary | ICD-10-CM | POA: Diagnosis not present

## 2019-12-26 DIAGNOSIS — R0600 Dyspnea, unspecified: Secondary | ICD-10-CM | POA: Diagnosis not present

## 2019-12-26 DIAGNOSIS — F319 Bipolar disorder, unspecified: Secondary | ICD-10-CM | POA: Diagnosis not present

## 2019-12-26 DIAGNOSIS — E1122 Type 2 diabetes mellitus with diabetic chronic kidney disease: Secondary | ICD-10-CM | POA: Insufficient documentation

## 2019-12-26 DIAGNOSIS — Z86711 Personal history of pulmonary embolism: Secondary | ICD-10-CM | POA: Insufficient documentation

## 2019-12-26 DIAGNOSIS — I480 Paroxysmal atrial fibrillation: Secondary | ICD-10-CM | POA: Diagnosis not present

## 2019-12-26 DIAGNOSIS — E43 Unspecified severe protein-calorie malnutrition: Secondary | ICD-10-CM | POA: Insufficient documentation

## 2019-12-26 DIAGNOSIS — G4733 Obstructive sleep apnea (adult) (pediatric): Secondary | ICD-10-CM | POA: Insufficient documentation

## 2019-12-26 DIAGNOSIS — Z8249 Family history of ischemic heart disease and other diseases of the circulatory system: Secondary | ICD-10-CM | POA: Insufficient documentation

## 2019-12-26 DIAGNOSIS — I129 Hypertensive chronic kidney disease with stage 1 through stage 4 chronic kidney disease, or unspecified chronic kidney disease: Secondary | ICD-10-CM | POA: Diagnosis not present

## 2019-12-26 DIAGNOSIS — Z833 Family history of diabetes mellitus: Secondary | ICD-10-CM | POA: Insufficient documentation

## 2019-12-26 DIAGNOSIS — Z818 Family history of other mental and behavioral disorders: Secondary | ICD-10-CM | POA: Insufficient documentation

## 2019-12-26 MED ORDER — IPRATROPIUM-ALBUTEROL 0.5-2.5 (3) MG/3ML IN SOLN
RESPIRATORY_TRACT | Status: AC
  Administered 2019-12-26: 3 mL via RESPIRATORY_TRACT
  Filled 2019-12-26: qty 3

## 2019-12-26 MED ORDER — DILTIAZEM HCL 25 MG/5ML IV SOLN
20.0000 mg | Freq: Once | INTRAVENOUS | Status: AC
  Administered 2019-12-26: 20 mg via INTRAVENOUS
  Filled 2019-12-26: qty 5

## 2019-12-26 MED ORDER — METHOHEXITAL SODIUM 0.5 G IJ SOLR
INTRAMUSCULAR | Status: AC
  Filled 2019-12-26: qty 500

## 2019-12-26 MED ORDER — FUROSEMIDE 10 MG/ML IJ SOLN: 10.0000 mg | Freq: Once | INTRAMUSCULAR | Status: DC

## 2019-12-26 MED ORDER — IPRATROPIUM-ALBUTEROL 0.5-2.5 (3) MG/3ML IN SOLN: 3.0000 mL | Freq: Once | RESPIRATORY_TRACT | Status: AC

## 2019-12-26 MED ORDER — SODIUM CHLORIDE 0.9 % IV SOLN: 500.0000 mL | Freq: Once | INTRAVENOUS | Status: AC

## 2019-12-26 MED ORDER — ONDANSETRON HCL 4 MG/2ML IJ SOLN: 4.0000 mg | Freq: Once | INTRAMUSCULAR | Status: DC | PRN

## 2019-12-26 MED ORDER — GLYCOPYRROLATE 0.2 MG/ML IJ SOLN
0.1000 mg | Freq: Once | INTRAMUSCULAR | Status: AC
  Administered 2019-12-26: 0.1 mg via INTRAVENOUS

## 2019-12-26 MED ORDER — GLYCOPYRROLATE 0.2 MG/ML IJ SOLN
INTRAMUSCULAR | Status: AC
  Filled 2019-12-26: qty 1

## 2019-12-26 MED ORDER — FUROSEMIDE 10 MG/ML IJ SOLN
INTRAMUSCULAR | Status: AC
  Filled 2019-12-26: qty 2

## 2019-12-26 MED ORDER — FENTANYL CITRATE (PF) 100 MCG/2ML IJ SOLN: 25.0000 ug | INTRAMUSCULAR | Status: DC | PRN

## 2019-12-26 MED ORDER — METOPROLOL TARTRATE 5 MG/5ML IV SOLN
INTRAVENOUS | Status: DC | PRN
  Administered 2019-12-26 (×2): 5 mg via INTRAVENOUS

## 2019-12-26 MED ORDER — SUCCINYLCHOLINE CHLORIDE 20 MG/ML IJ SOLN
INTRAMUSCULAR | Status: AC
  Filled 2019-12-26: qty 1

## 2019-12-26 MED ORDER — FENTANYL CITRATE (PF) 100 MCG/2ML IJ SOLN
INTRAMUSCULAR | Status: AC
  Filled 2019-12-26: qty 2

## 2019-12-26 MED ORDER — LABETALOL HCL 5 MG/ML IV SOLN
INTRAVENOUS | Status: DC | PRN
  Administered 2019-12-26 (×2): 10 mg via INTRAVENOUS

## 2019-12-26 MED ORDER — FENTANYL CITRATE (PF) 100 MCG/2ML IJ SOLN
INTRAMUSCULAR | Status: DC | PRN
  Administered 2019-12-26: 100 ug via INTRAVENOUS

## 2019-12-26 MED ORDER — METOPROLOL TARTRATE 5 MG/5ML IV SOLN
INTRAVENOUS | Status: AC
  Filled 2019-12-26: qty 5

## 2019-12-26 MED ORDER — LABETALOL HCL 5 MG/ML IV SOLN
INTRAVENOUS | Status: AC
  Filled 2019-12-26: qty 4

## 2019-12-26 NOTE — H&P (Signed)
Patient's blood pressure in the treatment room remained elevated to an unacceptable degree.  Efforts to lower it were only partially successful.  At this point it does not appear safe to proceed with ECT today.  Treatment being canceled for now.  Patient is being moved to where we can get a full EKG and ask one of the medicine doctors to offer an opinion.

## 2019-12-26 NOTE — Transfer of Care (Signed)
Immediate Anesthesia Transfer of Care Note  Patient: Christian [name]  Procedure(s) Performed: ECT TX  Patient Location: PACU  Anesthesia Type:case canceled due to extreme HTN and HR 148  Level of Consciousness: awake, alert  and oriented  Airway & Oxygen Therapy: Patient Spontanous Breathing and Patient connected to face mask oxygen  Post-op Assessment: Report given to RN and Post -op Vital signs reviewed and stable  Post vital signs: Reviewed and stable  Last Vitals:  Vitals Value Taken Time  BP 122/73 12/26/19 1122  Temp 36.7 C 12/26/19 1122  Pulse 63 12/26/19 1127  Resp 19 12/26/19 1127  SpO2 92 % 12/26/19 1127  Vitals shown include unvalidated device data.  Last Pain:  Vitals:   12/26/19 0834  TempSrc:   PainSc: 0-No pain         Complications: No apparent anesthesia complications

## 2019-12-26 NOTE — Consult Note (Signed)
[facility] Consultation Note  Patient ID: Christian [name], MRN: [id number], DOB/AGE: 1951-05-14 69 y.o. Admit date: 12/26/2019   Date of Consult: 12/26/2019 Primary Physician: [name], MD Primary Cardiologist: None  Chief Complaint: No chief complaint on file.  Reason for Consult: Atrial flutter and malignant hypertension  HPI: 69 y.o. male with known bipolar disorder chronic kidney disease lower extremity edema sleep apnea history of congestive heart failure and paroxysmal nonvalvular atrial fibrillation.  The patient has been on appropriate medication management for the majority of these issues and has had no apparent symptoms of chest discomfort or significant shortness of breath.  He was here for ECT and noticed that his blood pressure was extremely high at 200 systolic and he had an irregular heartbeat with rapid rate.  It does appear that the EKG shows atrial flutter with rapid ventricular rate now more controlled after intravenous diltiazem and beta-blocker.  He previously has been on beta-blocker for which she did not take this morning and likely will have good heart rate control when reinstated.  Blood pressure also will likely be improved after patient takes his medications.  In addition to that the patient does have new onset atrial flutter with controlled ventricular rate at this point with no evidence of overt congestive heart failure or anginal symptoms or myocardial infarction.  He is hemodynamically stable  Past Medical History:  Diagnosis Date  . 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 Bipolar 1 disorder (HCC)   . Bipolar disorder (HCC)   . Chronic kidney disease   . Gout   . Hx of blood clots   . Hypertension   . Mood swings   . OSA on CPAP 06/02/2014  . Other and unspecified hyperlipidemia 06/02/2014  . Type II or unspecified type diabetes mellitus with unspecified complication, uncontrolled 06/02/2014      Surgical  History: No past surgical history on file.   Home Meds: Prior to Admission medications   Medication Sig Start Date End Date Taking? Authorizing Provider  acetaminophen (TYLENOL) 325 MG tablet Take 2 tablets (650 mg total) by mouth every 6 (six) hours as needed for mild pain (or Fever >/= 101). 06/23/18   Alwyn Ren, MD  ARIPiprazole (ABILIFY) 10 MG tablet Take 1 tablet (10 mg total) by mouth daily. Patient not taking: Reported on 10/31/2019 10/24/19   Linwood Dibbles, MD  ARIPiprazole (ABILIFY) 5 MG tablet Take 5 mg by mouth daily.    [provider]  atorvastatin (LIPITOR) 10 MG tablet Take 10 mg by mouth every evening.     [provider]  clonazePAM (KLONOPIN) 0.5 MG tablet Take 0.5 mg by mouth 2 (two) times daily.    [provider]  divalproex (DEPAKOTE) 125 MG DR tablet Take 125 mg by mouth 2 (two) times daily.    [provider]  divalproex (DEPAKOTE) 250 MG DR tablet Take 1 tablet (250 mg total) by mouth 2 (two) times daily. Patient not taking: Reported on 10/31/2019 10/24/19   Linwood Dibbles, MD  docusate sodium (COLACE) 100 MG capsule Take 1 capsule (100 mg total) by mouth 2 (two) times daily. Patient not taking: Reported on 10/19/2019 06/23/18   Alwyn Ren, MD  furosemide (LASIX) 20 MG tablet Take 1 tablet (20 mg total) by mouth daily. 10/24/19   Linwood Dibbles, MD  insulin aspart (NOVOLOG) 100 UNIT/ML injection Inject 0-9 Units into the skin 3 (three) times daily with meals. Patient not taking:  Reported on 10/19/2019 06/23/18   Alwyn Ren, MD  Insulin Glargine (LANTUS SOLOSTAR) 100 UNIT/ML Solostar Pen Inject 34 Units into the skin daily with breakfast.    [provider]  liothyronine (CYTOMEL) 25 MCG tablet Take 25 mcg by mouth daily before breakfast.     [provider]  lisinopril (ZESTRIL) 20 MG tablet Take 20 mg by mouth daily with breakfast.    [provider]  LORazepam (ATIVAN) 0.5 MG tablet Take 1  tablet (0.5 mg total) by mouth 3 (three) times daily. 10/23/19   Malvin Johns, MD  metFORMIN (GLUCOPHAGE-XR) 500 MG 24 hr tablet Take 1,000 mg by mouth daily with breakfast.     [provider]  metoprolol succinate (TOPROL-XL) 100 MG 24 hr tablet Take 100 mg by mouth daily. Take with or immediately following a meal.     [provider]  modafinil (PROVIGIL) 100 MG tablet Take 1 tablet (100 mg total) by mouth daily. 10/24/19   Linwood Dibbles, MD  rivaroxaban (XARELTO) 20 MG TABS tablet Take 1 tablet (20 mg total) by mouth daily with supper. 06/23/18   Alwyn Ren, MD    Inpatient Medications:     Allergies:  Allergies  Allergen Reactions  . Zyprexa [Olanzapine] Other (See Comments)    Allergy noted by ACT Team. No reaction specified (??)    Social History   Socioeconomic History  . Marital status: Divorced    Spouse name: Not on file  . Number of children: Not on file  . Years of education: Not on file  . Highest education level: Not on file  Occupational History  . Not on file  Tobacco Use  . Smoking status: Never Smoker  . Smokeless tobacco: Never Used  Substance and Sexual Activity  . Alcohol use: No  . Drug use: No  . Sexual activity: Not Currently  Other Topics Concern  . Not on file  Social History Narrative   *The patient has been married for over 30 years and is currently on disability. He lives with his wife in the [place] area and has one daughter.             Social Determinants of Health   Financial Resource Strain:   . Difficulty of Paying Living Expenses: Not on file  Food Insecurity:   . Worried About Programme researcher, broadcasting/film/video in the Last Year: Not on file  . Ran Out of Food in the Last Year: Not on file  Transportation Needs:   . Lack of Transportation (Medical): Not on file  . Lack of Transportation (Non-Medical): Not on file  Physical Activity:   . Days of Exercise per Week: Not on file  . Minutes of Exercise per Session: Not on  file  Stress:   . Feeling of Stress : Not on file  Social Connections:   . Frequency of Communication with Friends and Family: Not on file  . Frequency of Social Gatherings with Friends and Family: Not on file  . Attends Religious Services: Not on file  . Active Member of Clubs or Organizations: Not on file  . Attends Banker Meetings: Not on file  . Marital Status: Not on file  Intimate Partner Violence:   . Fear of Current or Ex-Partner: Not on file  . Emotionally Abused: Not on file  . Physically Abused: Not on file  . Sexually Abused: Not on file     Family History  Problem Relation Age of Onset  .  Dementia Mother   . Arthritis Father   . Hypertension Father   . Mental illness Sister   . Diabetes Sister   . Heart failure Neg Hx   . Kidney failure Neg Hx   . Cancer Neg Hx      Review of Systems Positive for none Negative for: General:  chills, fever, night sweats or weight changes.  Cardiovascular: PND orthopnea syncope dizziness  Dermatological skin lesions rashes Respiratory: Cough congestion Urologic: Frequent urination urination at night and hematuria Abdominal: negative for nausea, vomiting, diarrhea, bright red blood per rectum, melena, or hematemesis Neurologic: negative for visual changes, and/or hearing changes  All other systems reviewed and are otherwise negative except as noted above.  Labs: No results for input(s): CKTOTAL, CKMB, TROPONINI in the last 72 hours. Lab Results  Component Value Date   WBC 5.4 10/31/2019   HGB 15.9 10/31/2019   HCT 49.6 10/31/2019   MCV 86.3 10/31/2019   PLT 254 10/31/2019   No results for input(s): NA, K, CL, CO2, BUN, CREATININE, CALCIUM, PROT, BILITOT, ALKPHOS, ALT, AST, GLUCOSE in the last 168 hours.  Invalid input(s): LABALBU Lab Results  Component Value Date   CHOL 108 12/28/2015   HDL 33 (L) 12/28/2015   LDLCALC 63 12/28/2015   TRIG 62 12/28/2015   No results found for:  DDIMER  Radiology/Studies:  [facility] Chest Port 1 View  Result Date: 12/26/2019 CLINICAL DATA:  Shortness of breath. EXAM: PORTABLE CHEST 1 VIEW COMPARISON:  10/31/2019 FINDINGS: Right hemidiaphragm elevation. Midline trachea. Borderline cardiomegaly. No pleural effusion or pneumothorax. Low lung volumes. Hazy right and possible left sided pulmonary opacities are suspected. IMPRESSION: Suspicion of hazy pulmonary opacities. In the setting of borderline cardiomegaly and low lung volumes, nonspecific. Considerations include mild pneumonia or less likely pulmonary edema. If PA and lateral radiographs or practical, these should be considered. Electronically Signed   By: Jeronimo Greaves M.D.   On: 12/26/2019 12:26    EKG: Atrial flutter with controlled ventricular rate nonspecific ST changes  Weights: There were no vitals filed for this visit.   Physical Exam: There were no vitals taken for this visit. There is no height or weight on file to calculate BMI. General: Well developed, well nourished, in no acute distress. Head eyes ears nose throat: Normocephalic, atraumatic, sclera non-icteric, no xanthomas, nares are without discharge. No apparent thyromegaly and/or mass  Lungs: Normal respiratory effort.  no wheezes, no rales, no rhonchi.  Heart: Irregular with normal S1 S2. no murmur gallop, no rub, PMI is normal size and placement, carotid upstroke normal without bruit, jugular venous pressure is normal Abdomen: Soft, non-tender, non-distended with normoactive bowel sounds. No hepatomegaly. No rebound/guarding. No obvious abdominal masses. Abdominal aorta is normal size without bruit Extremities: 1-2+ edema. no cyanosis, no clubbing, no ulcers  Peripheral : 2+ bilateral upper extremity pulses, 2+ bilateral femoral pulses, 2+ bilateral dorsal pedal pulse Neuro: Alert and oriented. No facial asymmetry. No focal deficit. Moves all extremities spontaneously. Musculoskeletal: Normal muscle tone without  kyphosis Psych:  Responds to questions appropriately with a normal affect.    Assessment: 69 year old male with sleep apnea hypertension hyperlipidemia and new onset atrial flutter now with controlled ventricular rate needing further medical management  Plan: 1.  Reinstatement of oral beta-blocker for heart rate control of atrial flutter 2.  Further consideration of Eliquis 5 mg twice per day for risk reduction in stroke with atrial fibrillation 3.  Continue furosemide for lower extremity edema and history of heart failure  4.  Reinstatement of oral medications for hypertension control at this time before discharge 5.  Okay for discharge to home with current advice and follow-up in 2 days for further adjustments of medication management and further diagnostic contusing the possibility of cardiac echocardiogram  Signed, Corey Skains M.D. [facility] 12/26/2019, 1:07 PM

## 2019-12-26 NOTE — Progress Notes (Signed)
This RN spoke with Clerance Lav in Cath Lab who relayed a message to Dr. Gwen Pounds. Per Clerance Lav Dr. Gwen Pounds stated pt could go home and follow up with him in the office on 12/28/19.

## 2019-12-26 NOTE — Anesthesia Preprocedure Evaluation (Signed)
Anesthesia Evaluation  Patient identified by MRN, date of birth, ID band Patient awake    Reviewed: Allergy & Precautions, NPO status , Patient's Chart, lab work & pertinent test results  Airway Mallampati: III  TM Distance: >3 FB Neck ROM: Limited    Dental  (+) Poor Dentition, Missing   Pulmonary shortness of breath and with exertion, sleep apnea and Continuous Positive Airway Pressure Ventilation ,    Pulmonary exam normal breath sounds clear to auscultation       Cardiovascular Exercise Tolerance: Poor hypertension, Pt. on medications and Pt. on home beta blockers + Peripheral Vascular Disease  Normal cardiovascular exam  On elequis.   Neuro/Psych PSYCHIATRIC DISORDERS Depression Bipolar Disorder negative neurological ROS     GI/Hepatic negative GI ROS, Neg liver ROS,   Endo/Other  diabetes, Well Controlled, Type 2BG 88.  Renal/GU negative Renal ROS  negative genitourinary   Musculoskeletal negative musculoskeletal ROS (+)   Abdominal (+) + obese,   Peds negative pediatric ROS (+)  Hematology negative hematology ROS (+)   Anesthesia Other Findings   Reproductive/Obstetrics                             Anesthesia Physical  Anesthesia Plan  ASA: III  Anesthesia Plan: General   Post-op Pain Management:    Induction: Intravenous  PONV Risk Score and Plan:   Airway Management Planned: Mask  Additional Equipment:   Intra-op Plan:   Post-operative Plan:   Informed Consent: I have reviewed the patients History and Physical, chart, labs and discussed the procedure including the risks, benefits and alternatives for the proposed anesthesia with the patient or authorized representative who has indicated his/her understanding and acceptance.       Plan Discussed with: CRNA  Anesthesia Plan Comments:         Anesthesia Quick Evaluation

## 2019-12-26 NOTE — Discharge Instructions (Signed)
1)  The drugs that you have been given will stay in your system until tomorrow so for the       next 24 hours you should not:  A. Drive an automobile  B. Make any legal decisions  C. Drink any alcoholic beverages  2)  You may resume your regular meals upon return home.  3)  A responsible adult must take you home.  Someone should stay with you for a few          hours, then be available by phone for the remainder of the treatment day.  4)  You May experience any of the following symptoms:  Headache, Nausea and a dry mouth (due to the medications you were given),  temporary memory loss and some confusion, or sore muscles (a warm bath  should help this).  If you you experience any of these symptoms let us know on                your return visit.  5)  Report any of the following: any acute discomfort, severe headache, or temperature        greater than 100.5 F.   Also report any unusual redness, swelling, drainage, or pain         at your IV site.    You may report Symptoms to:  ECT PROGRAM- [place] [facility]          Phone: [phone], ECT Department           or Dr. Shary Key office [phone]  6)  Your next ECT Treatment is after cardiology clearance  We will call 2 days prior to your scheduled appointment for arrival times.  7)  Nothing to eat or drink after midnight the night before your procedure.  8)  Take    With a sip of water the morning of your procedure.  9)  Other Instructions: Call [phone] to cancel the morning of your procedure due         to illness or emergency.  10) We will call within 72 hours to assess how you are feeling.

## 2019-12-26 NOTE — Anesthesia Procedure Notes (Signed)
Date/Time: 12/26/2019 10:52 AM Performed by: Henrietta Hoover, CRNA Pre-anesthesia Checklist: Patient identified, Emergency Drugs available, Suction available, Patient being monitored and Timeout performed Patient Re-evaluated:Patient Re-evaluated prior to induction Oxygen Delivery Method: Simple face mask and Circle system utilized Preoxygenation: Pre-oxygenation with 100% oxygen

## 2019-12-26 NOTE — Progress Notes (Signed)
Pt arrived from ECT. Dr. Noralyn Pick at bedside. Orders given for Lasix and Duoneb. Dr. Toni Amend spoke with Cardiology to consult on pt.

## 2019-12-26 NOTE — H&P (Signed)
Christian [name] is an 68 y.o. male.   Chief Complaint: more stable.  HPI: bipolar depression  Past Medical History:  Diagnosis Date  . 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 Bipolar 1 disorder ([place])   . Bipolar disorder ([place])   . Chronic kidney disease   . Gout   . Hx of blood clots   . Hypertension   . Mood swings   . OSA on CPAP 06/02/2014  . Other and unspecified hyperlipidemia 06/02/2014  . Type II or unspecified type diabetes mellitus with unspecified complication, uncontrolled 06/02/2014    History reviewed. No pertinent surgical history.  Family History  Problem Relation Age of Onset  . Dementia Mother   . Arthritis Father   . Hypertension Father   . Mental illness Sister   . Diabetes Sister   . Heart failure Neg Hx   . Kidney failure Neg Hx   . Cancer Neg Hx    Social History:  reports that he has never smoked. He has never used smokeless tobacco. He reports that he does not drink alcohol or use drugs.  Allergies:  Allergies  Allergen Reactions  . Zyprexa [Olanzapine] Other (See Comments)    Allergy noted by ACT Team. No reaction specified (??)    (Not in a hospital admission)   Results for orders placed or performed during the hospital encounter of 12/24/19 (from the past 48 hour(s))  SARS CORONAVIRUS 2 (TAT 6-24 HRS) Nasopharyngeal Nasopharyngeal Swab     Status: None   Collection Time: 12/24/19 12:21 PM   Specimen: Nasopharyngeal Swab  Result Value Ref Range   SARS Coronavirus 2 NEGATIVE NEGATIVE    Comment: (NOTE) SARS-CoV-2 target nucleic acids are NOT DETECTED. The SARS-CoV-2 RNA is generally detectable in upper and lower respiratory specimens during the acute phase of infection. Negative results do not preclude SARS-CoV-2 infection, do not rule out co-infections with other pathogens, and should not be used as the sole basis for treatment or other patient management decisions. Negative results must be combined  with clinical observations, patient history, and epidemiological information. The expected result is Negative. Fact Sheet for Patients: SugarRoll.be Fact Sheet for Healthcare Providers: https://www.woods-mathews.com/ This test is not yet approved or cleared by the [place] FDA and  has been authorized for detection and/or diagnosis of SARS-CoV-2 by FDA under an Emergency Use Authorization (EUA). This EUA will remain  in effect (meaning this test can be used) for the duration of the COVID-19 declaration under Section 56 4(b)(1) of the Act, 21 U.S.C. section 360bbb-3(b)(1), unless the authorization is terminated or revoked sooner. Performed at [facility], [place] [address], [place], [place] [address]    No results found.  Review of Systems  Constitutional: Negative.   HENT: Negative.   Eyes: Negative.   Respiratory: Negative.   Cardiovascular: Negative.   Gastrointestinal: Negative.   Musculoskeletal: Negative.   Skin: Negative.   Neurological: Negative.   Psychiatric/Behavioral: Negative.     Blood pressure (!) 130/91, pulse (!) 105, temperature 98.7 F (37.1 C), temperature source Oral, resp. rate 18, SpO2 98 %. Physical Exam  Nursing note and vitals reviewed. Constitutional: He appears well-developed and well-nourished.  HENT:  Head: Normocephalic and atraumatic.  Eyes: Pupils are equal, round, and reactive to light. Conjunctivae are normal.  Cardiovascular: Regular rhythm and normal heart sounds.  Respiratory: Effort normal.  GI: Soft.  Musculoskeletal:        General: Normal range of motion.  Cervical back: Normal range of motion.  Neurological: He is alert.  Skin: Skin is warm and dry.  Psychiatric: He has a normal mood and affect. His behavior is normal. Judgment and thought content normal.     Assessment/Plan maintainence  [name], MD 12/26/2019, 10:00 AM

## 2019-12-26 NOTE — Anesthesia Preprocedure Evaluation (Deleted)
Anesthesia Evaluation  Patient identified by MRN, date of birth, ID band Patient awake    Reviewed: Allergy & Precautions, NPO status , Patient's Chart, lab work & pertinent test results  Airway        Dental   Pulmonary shortness of breath and with exertion, sleep apnea ,           Cardiovascular hypertension,      Neuro/Psych PSYCHIATRIC DISORDERS Depression Bipolar Disorder Schizophrenia    GI/Hepatic   Endo/Other  diabetes  Renal/GU Renal InsufficiencyRenal disease     Musculoskeletal negative musculoskeletal ROS (+)   Abdominal   Peds  Hematology negative hematology ROS (+)   Anesthesia Other Findings   Reproductive/Obstetrics                             Anesthesia Physical Anesthesia Plan Anesthesia Quick Evaluation

## 2019-12-27 DIAGNOSIS — I4892 Unspecified atrial flutter: Secondary | ICD-10-CM | POA: Insufficient documentation

## 2019-12-27 NOTE — Anesthesia Postprocedure Evaluation (Signed)
Anesthesia Post Note  Patient: [name]  Procedure(s) Performed: ECT TX  Patient location during evaluation: PACU Level of consciousness: awake and alert and oriented Pain management: pain level controlled Vital Signs Assessment: post-procedure vital signs reviewed and stable Respiratory status: spontaneous breathing Cardiovascular status: blood pressure returned to baseline Anesthetic complications: no Comments: Case aborted prior to start secondary to HTN and Fib/flutter with high rate as patient was wheeled into the room.  Labetolol, metoprolol , fentanyl and cardiazem given to resolve the situation with only cardiazem providing resolution.  Cardiology consulted after 12lead EKG and Chest Xray obtained.     Last Vitals:  Vitals:   12/26/19 1321 12/26/19 1400  BP: 118/76 119/78  Pulse: 76 75  Resp: (!) 21 20  Temp: 36.8 C 36.9 C  SpO2: 94%     Last Pain:  Vitals:   12/26/19 1400  TempSrc: Oral  PainSc: 0-No pain                 Christian [name]

## 2020-01-07 ENCOUNTER — Other Ambulatory Visit (HOSPITAL_COMMUNITY)
Admission: RE | Admit: 2020-01-07 | Discharge: 2020-01-07 | Disposition: A | Discharge status: Home / Self Care | Payer: Medicare PPO | Source: Ambulatory Visit | Attending: Anesthesiology | Admitting: Anesthesiology

## 2020-01-07 ENCOUNTER — Other Ambulatory Visit (HOSPITAL_COMMUNITY): Payer: Medicare PPO

## 2020-01-07 DIAGNOSIS — Z20822 Contact with and (suspected) exposure to covid-19: Secondary | ICD-10-CM | POA: Diagnosis not present

## 2020-01-07 DIAGNOSIS — Z01812 Encounter for preprocedural laboratory examination: Secondary | ICD-10-CM | POA: Diagnosis present

## 2020-01-07 LAB — SARS CORONAVIRUS 2 (TAT 6-24 HRS): SARS Coronavirus 2: NEGATIVE

## 2020-01-10 ENCOUNTER — Other Ambulatory Visit: Payer: Self-pay | Admitting: Psychiatry

## 2020-01-11 ENCOUNTER — Encounter: Payer: Self-pay | Admitting: Registered Nurse

## 2020-01-11 ENCOUNTER — Encounter
Admission: RE | Admit: 2020-01-11 | Discharge: 2020-01-11 | Disposition: A | Discharge status: Home / Self Care | Payer: Medicare PPO | Source: Ambulatory Visit | Attending: Psychiatry | Admitting: Psychiatry

## 2020-01-11 ENCOUNTER — Other Ambulatory Visit: Payer: Self-pay

## 2020-01-11 DIAGNOSIS — I480 Paroxysmal atrial fibrillation: Secondary | ICD-10-CM | POA: Diagnosis not present

## 2020-01-11 DIAGNOSIS — F319 Bipolar disorder, unspecified: Secondary | ICD-10-CM | POA: Insufficient documentation

## 2020-01-11 DIAGNOSIS — E43 Unspecified severe protein-calorie malnutrition: Secondary | ICD-10-CM | POA: Diagnosis not present

## 2020-01-11 DIAGNOSIS — E1122 Type 2 diabetes mellitus with diabetic chronic kidney disease: Secondary | ICD-10-CM | POA: Insufficient documentation

## 2020-01-11 DIAGNOSIS — Z86711 Personal history of pulmonary embolism: Secondary | ICD-10-CM | POA: Insufficient documentation

## 2020-01-11 DIAGNOSIS — Z818 Family history of other mental and behavioral disorders: Secondary | ICD-10-CM | POA: Insufficient documentation

## 2020-01-11 DIAGNOSIS — E119 Type 2 diabetes mellitus without complications: Secondary | ICD-10-CM | POA: Diagnosis not present

## 2020-01-11 DIAGNOSIS — I129 Hypertensive chronic kidney disease with stage 1 through stage 4 chronic kidney disease, or unspecified chronic kidney disease: Secondary | ICD-10-CM | POA: Diagnosis not present

## 2020-01-11 DIAGNOSIS — G4733 Obstructive sleep apnea (adult) (pediatric): Secondary | ICD-10-CM | POA: Insufficient documentation

## 2020-01-11 DIAGNOSIS — N189 Chronic kidney disease, unspecified: Secondary | ICD-10-CM | POA: Insufficient documentation

## 2020-01-11 DIAGNOSIS — Z8249 Family history of ischemic heart disease and other diseases of the circulatory system: Secondary | ICD-10-CM | POA: Insufficient documentation

## 2020-01-11 DIAGNOSIS — Z882 Allergy status to sulfonamides status: Secondary | ICD-10-CM | POA: Insufficient documentation

## 2020-01-11 DIAGNOSIS — Z833 Family history of diabetes mellitus: Secondary | ICD-10-CM | POA: Diagnosis not present

## 2020-01-11 MED ORDER — SODIUM CHLORIDE 0.9 % IV SOLN: INTRAVENOUS | Status: DC | PRN

## 2020-01-11 MED ORDER — KETOROLAC TROMETHAMINE 30 MG/ML IJ SOLN
30.0000 mg | Freq: Once | INTRAMUSCULAR | Status: AC
  Administered 2020-01-11: 30 mg via INTRAVENOUS

## 2020-01-11 MED ORDER — SUCCINYLCHOLINE CHLORIDE 20 MG/ML IJ SOLN
INTRAMUSCULAR | Status: DC | PRN
  Administered 2020-01-11: 150 mg via INTRAVENOUS

## 2020-01-11 MED ORDER — MIDAZOLAM HCL 2 MG/2ML IJ SOLN: 2.0000 mg | Freq: Once | INTRAMUSCULAR | Status: DC

## 2020-01-11 MED ORDER — SODIUM CHLORIDE 0.9 % IV SOLN
500.0000 mL | Freq: Once | INTRAVENOUS | Status: AC
  Administered 2020-01-11: 500 mL via INTRAVENOUS

## 2020-01-11 MED ORDER — LABETALOL HCL 5 MG/ML IV SOLN
INTRAVENOUS | Status: DC | PRN
  Administered 2020-01-11: 15 mg via INTRAVENOUS
  Administered 2020-01-11: 20 mg via INTRAVENOUS

## 2020-01-11 MED ORDER — METHOHEXITAL SODIUM 100 MG/10ML IV SOSY
PREFILLED_SYRINGE | INTRAVENOUS | Status: DC | PRN
  Administered 2020-01-11: 100 mg via INTRAVENOUS

## 2020-01-11 NOTE — Procedures (Signed)
ECT SERVICES Physician's Interval Evaluation & Treatment Note  Patient Identification: Christian [name] MRN:  [id number] Date of Evaluation:  01/11/2020 TX #: 1  MADRS:   MMSE:   P.E. Findings:  Patient is having sinus rhythm vitals fairly normal no physical complaints  Psychiatric Interval Note:  Lucid not psychotic no complaints  Subjective:  Patient is a 69 y.o. male seen for evaluation for Electroconvulsive Therapy. Seems to feel okay a little nervous  Treatment Summary:   [x]   Right Unilateral             []  Bilateral   % Energy : 0.3 ms 100%   Impedance: 1020 ohms  Seizure Energy Index: 471 V squared  Postictal Suppression Index: 24%  Seizure Concordance Index: 85%  Medications  Pre Shock: Labetalol 20 mg Brevital 100 mg succinylcholine 150 mg  Post Shock:    Seizure Duration: 36 seconds EMG 69 seconds EEG   Comments: Not the greatest quality but plenty long enough.  We will see him back in another 4 weeks.  Lungs:  [x]   Clear to auscultation               []  Other:   Heart:    [x]   Regular rhythm             []  irregular rhythm    [x]   Previous H&P reviewed, patient examined and there are NO CHANGES                 []   Previous H&P reviewed, patient examined and there are changes noted.   , MD 2/12/202110:48 AM

## 2020-01-11 NOTE — Discharge Instructions (Signed)
1)  The drugs that you have been given will stay in your system until tomorrow so for the       next 24 hours you should not:  A. Drive an automobile  B. Make any legal decisions  C. Drink any alcoholic beverages  2)  You may resume your regular meals upon return home.  3)  A responsible adult must take you home.  Someone should stay with you for a few          hours, then be available by phone for the remainder of the treatment day.  4)  You May experience any of the following symptoms:  Headache, Nausea and a dry mouth (due to the medications you were given),  temporary memory loss and some confusion, or sore muscles (a warm bath  should help this).  If you you experience any of these symptoms let us know on                your return visit.  5)  Report any of the following: any acute discomfort, severe headache, or temperature        greater than 100.5 F.   Also report any unusual redness, swelling, drainage, or pain         at your IV site.    You may report Symptoms to:  ECT PROGRAM- [place] at [facility]          Phone: [phone], ECT Department           or Dr. Shary Key office [phone]  6)  Your next ECT Treatment is Friday March 12  We will call 2 days prior to your scheduled appointment for arrival times.  7)  Nothing to eat or drink after midnight the night before your procedure.  8)  Take blood pressure medication with a sip of water the morning of your procedure.  9)  Other Instructions: Call [phone] to cancel the morning of your procedure due         to illness or emergency.  10) We will call within 72 hours to assess how you are feeling.

## 2020-01-11 NOTE — Anesthesia Postprocedure Evaluation (Signed)
Anesthesia Post Note  Patient: Christian [name]  Procedure(s) Performed: ECT TX  Patient location during evaluation: PACU Anesthesia Type: General Level of consciousness: awake and alert Pain management: pain level controlled Vital Signs Assessment: post-procedure vital signs reviewed and stable Respiratory status: spontaneous breathing, nonlabored ventilation and respiratory function stable Cardiovascular status: blood pressure returned to baseline and stable Postop Assessment: no signs of nausea or vomiting Anesthetic complications: no     Last Vitals:  Vitals:   01/11/20 1123 01/11/20 1133  BP: (!) 168/108 (!) 148/99  Pulse: (!) 107 (!) 105  Resp: (!) 22 (!) 24  Temp:  37.3 C  SpO2: 99% 95%    Last Pain:  Vitals:   01/11/20 1133  TempSrc:   PainSc: 0-No pain                 Kimaya Whitlatch

## 2020-01-11 NOTE — Anesthesia Preprocedure Evaluation (Signed)
Anesthesia Evaluation  Patient identified by MRN, date of birth, ID band Patient awake    Reviewed: Allergy & Precautions, NPO status , Patient's Chart, lab work & pertinent test results  History of Anesthesia Complications Negative for: history of anesthetic complications  Airway Mallampati: III  TM Distance: >3 FB Neck ROM: Limited    Dental  (+) Poor Dentition, Missing   Pulmonary shortness of breath and with exertion, sleep apnea and Continuous Positive Airway Pressure Ventilation , neg COPD, neg recent URI,    Pulmonary exam normal        Cardiovascular Exercise Tolerance: Poor hypertension, Pt. on medications and Pt. on home beta blockers (-) angina+ Peripheral Vascular Disease  (-) Past MI and (-) Cardiac Stents Normal cardiovascular exam(-) dysrhythmias (-) Valvular Problems/Murmurs  On elequis.   Neuro/Psych PSYCHIATRIC DISORDERS Depression Bipolar Disorder negative neurological ROS     GI/Hepatic negative GI ROS, Neg liver ROS,   Endo/Other  diabetes, Well Controlled, Type 2BG 88.  Renal/GU CRFRenal disease  negative genitourinary   Musculoskeletal negative musculoskeletal ROS (+)   Abdominal (+) + obese,   Peds negative pediatric ROS (+)  Hematology negative hematology ROS (+)   Anesthesia Other Findings Past Medical History: 06/02/2014: Bilateral lower extremity edema: chronic with venous stasis  changes No date: Bipolar 1 disorder ([facility]) No date: Bipolar disorder ([facility]) No date: Chronic kidney disease No date: Gout No date: Hx of blood clots No date: Hypertension No date: Mood swings 06/02/2014: OSA on CPAP 06/02/2014: Other and unspecified hyperlipidemia 06/02/2014: Type II or unspecified type diabetes mellitus with  unspecified complication, uncontrolled   Reproductive/Obstetrics                             Anesthesia Physical  Anesthesia Plan  ASA: III  Anesthesia  Plan: General   Post-op Pain Management:    Induction: Intravenous  PONV Risk Score and Plan: 2 and TIVA  Airway Management Planned: Mask  Additional Equipment:   Intra-op Plan:   Post-operative Plan:   Informed Consent: I have reviewed the patients History and Physical, chart, labs and discussed the procedure including the risks, benefits and alternatives for the proposed anesthesia with the patient or authorized representative who has indicated his/her understanding and acceptance.       Plan Discussed with: CRNA  Anesthesia Plan Comments:         Anesthesia Quick Evaluation  

## 2020-01-11 NOTE — Anesthesia Procedure Notes (Signed)
Performed by: Seng Larch, CRNA Pre-anesthesia Checklist: Patient identified, Emergency Drugs available, Suction available and Patient being monitored Patient Re-evaluated:Patient Re-evaluated prior to induction Oxygen Delivery Method: Circle system utilized Preoxygenation: Pre-oxygenation with 100% oxygen Induction Type: IV induction Ventilation: Mask ventilation without difficulty and Mask ventilation throughout procedure Airway Equipment and Method: Bite block Placement Confirmation: positive ETCO2 Dental Injury: Teeth and Oropharynx as per pre-operative assessment        

## 2020-01-11 NOTE — Transfer of Care (Signed)
Immediate Anesthesia Transfer of Care Note  Patient: Christian [name]  Procedure(s) Performed: ECT TX  Patient Location: PACU  Anesthesia Type:General  Level of Consciousness: sedated  Airway & Oxygen Therapy: Patient Spontanous Breathing and Patient connected to face mask oxygen  Post-op Assessment: Report given to RN and Post -op Vital signs reviewed and stable  Post vital signs: Reviewed and stable  Last Vitals:  Vitals Value Taken Time  BP 212/134 01/11/20 1103  Temp    Pulse 111 01/11/20 1105  Resp 21 01/11/20 1105  SpO2 98 % 01/11/20 1105  Vitals shown include unvalidated device data.  Last Pain:  Vitals:   01/11/20 0905  TempSrc: Oral  PainSc: 0-No pain         Complications: No apparent anesthesia complications

## 2020-01-11 NOTE — H&P (Signed)
Christian [name] is an 69 y.o. male.   Chief Complaint: chronic bipolar with recent depression. Currently feeling "ok" HPI: REcent hospitalization now stabilized  Past Medical History:  Diagnosis Date  . Bilateral lower extremity edema: chronic with venous stasis changes 06/02/2014  . Bipolar 1 disorder ([facility])   . Bipolar disorder ([facility])   . Chronic kidney disease   . Gout   . Hx of blood clots   . Hypertension   . Mood swings   . OSA on CPAP 06/02/2014  . Other and unspecified hyperlipidemia 06/02/2014  . Type II or unspecified type diabetes mellitus with unspecified complication, uncontrolled 06/02/2014    History reviewed. No pertinent surgical history.  Family History  Problem Relation Age of Onset  . Dementia Mother   . Arthritis Father   . Hypertension Father   . Mental illness Sister   . Diabetes Sister   . Heart failure Neg Hx   . Kidney failure Neg Hx   . Cancer Neg Hx    Social History:  reports that he has never smoked. He has never used smokeless tobacco. He reports that he does not drink alcohol or use drugs.  Allergies:  Allergies  Allergen Reactions  . Zyprexa [Olanzapine] Other (See Comments)    Allergy noted by ACT Team. No reaction specified (??)    (Not in a hospital admission)   No results found for this or any previous visit (from the past 48 hour(s)). No results found.  Review of Systems  Constitutional: Negative.   HENT: Negative.   Eyes: Negative.   Respiratory: Negative.   Cardiovascular: Negative.   Gastrointestinal: Negative.   Musculoskeletal: Negative.   Skin: Negative.   Neurological: Negative.   Psychiatric/Behavioral: Negative.     Blood pressure (!) 143/91, pulse 87, temperature 99.5 F (37.5 C), temperature source Oral, resp. rate 18, height 6' (1.829 m), weight 118.4 kg, SpO2 99 %. Physical Exam  Nursing note and vitals reviewed. Constitutional: He appears well-developed and well-nourished.  HENT:  Head: Normocephalic and  atraumatic.  Eyes: Pupils are equal, round, and reactive to light. Conjunctivae are normal.  Cardiovascular: Normal heart sounds.  Respiratory: Effort normal.  GI: Soft.  Musculoskeletal:        General: Normal range of motion.     Cervical back: Normal range of motion.  Neurological: He is alert.  Skin: Skin is warm and dry.  Psychiatric: Judgment normal. His mood appears anxious. His affect is blunt. His speech is delayed. He is slowed. Thought content is not delusional. He expresses no homicidal and no suicidal ideation. He exhibits abnormal recent memory.     Assessment/Plan Maintenance ECT q month most likely  Christian Rasmussen, MD 01/11/2020, 9:55 AM

## 2020-01-14 ENCOUNTER — Telehealth: Payer: Self-pay

## 2020-01-18 ENCOUNTER — Other Ambulatory Visit (HOSPITAL_COMMUNITY): Admission: RE | Admit: 2020-01-18 | Payer: Medicare PPO | Source: Ambulatory Visit

## 2020-01-18 ENCOUNTER — Other Ambulatory Visit: Payer: Self-pay | Admitting: Psychiatry

## 2020-01-21 ENCOUNTER — Telehealth: Payer: Self-pay

## 2020-01-22 ENCOUNTER — Other Ambulatory Visit (HOSPITAL_COMMUNITY)
Admission: RE | Admit: 2020-01-22 | Discharge: 2020-01-22 | Disposition: A | Discharge status: Home / Self Care | Payer: Medicare PPO | Source: Ambulatory Visit | Attending: Psychiatry | Admitting: Psychiatry

## 2020-01-22 ENCOUNTER — Other Ambulatory Visit: Payer: Self-pay | Admitting: Psychiatry

## 2020-01-22 DIAGNOSIS — Z01812 Encounter for preprocedural laboratory examination: Secondary | ICD-10-CM | POA: Insufficient documentation

## 2020-01-22 DIAGNOSIS — Z20822 Contact with and (suspected) exposure to covid-19: Secondary | ICD-10-CM | POA: Diagnosis not present

## 2020-01-22 LAB — SARS CORONAVIRUS 2 (TAT 6-24 HRS): SARS Coronavirus 2: NEGATIVE

## 2020-01-23 ENCOUNTER — Encounter: Payer: Self-pay | Admitting: Certified Registered Nurse Anesthetist

## 2020-01-23 ENCOUNTER — Other Ambulatory Visit: Payer: Self-pay

## 2020-01-23 ENCOUNTER — Encounter (HOSPITAL_BASED_OUTPATIENT_CLINIC_OR_DEPARTMENT_OTHER)
Admission: RE | Admit: 2020-01-23 | Discharge: 2020-01-23 | Disposition: A | Discharge status: Home / Self Care | Payer: Medicare PPO | Source: Ambulatory Visit | Attending: Psychiatry | Admitting: Psychiatry

## 2020-01-23 DIAGNOSIS — F319 Bipolar disorder, unspecified: Secondary | ICD-10-CM | POA: Diagnosis not present

## 2020-01-23 DIAGNOSIS — F251 Schizoaffective disorder, depressive type: Secondary | ICD-10-CM | POA: Diagnosis not present

## 2020-01-23 MED ORDER — OLANZAPINE 10 MG IM SOLR: 10.0000 mg | INTRAMUSCULAR | Status: DC

## 2020-01-23 MED ORDER — SUCCINYLCHOLINE CHLORIDE 20 MG/ML IJ SOLN
INTRAMUSCULAR | Status: DC | PRN
  Administered 2020-01-23: 150 mg via INTRAVENOUS

## 2020-01-23 MED ORDER — LABETALOL HCL 5 MG/ML IV SOLN
INTRAVENOUS | Status: AC
  Filled 2020-01-23: qty 4

## 2020-01-23 MED ORDER — SODIUM CHLORIDE 0.9 % IV SOLN: INTRAVENOUS | Status: DC | PRN

## 2020-01-23 MED ORDER — SUCCINYLCHOLINE CHLORIDE 20 MG/ML IJ SOLN
INTRAMUSCULAR | Status: AC
  Filled 2020-01-23: qty 1

## 2020-01-23 MED ORDER — LABETALOL HCL 5 MG/ML IV SOLN
INTRAVENOUS | Status: DC | PRN
  Administered 2020-01-23: 20 mg via INTRAVENOUS

## 2020-01-23 MED ORDER — SODIUM CHLORIDE 0.9 % IV SOLN: 500.0000 mL | Freq: Once | INTRAVENOUS | Status: DC

## 2020-01-23 MED ORDER — METHOHEXITAL SODIUM 100 MG/10ML IV SOSY
PREFILLED_SYRINGE | INTRAVENOUS | Status: DC | PRN
  Administered 2020-01-23: 100 mg via INTRAVENOUS

## 2020-01-23 NOTE — Procedures (Signed)
ECT SERVICES Physician's Interval Evaluation & Treatment Note  Patient Identification: Christian [name] MRN:  [id number] Date of Evaluation:  01/23/2020 TX #: 2  MADRS:   MMSE:   P.E. Findings:  No specific new findings or changes to physical  Psychiatric Interval Note:  Patient appears different.  He is acting more anxious and withdrawn.  Took him a while to cooperate with nursing today.  Daughter and wife both notice some real changes in his behavior recently.  Patient was not aggressive however and not reporting any suicidal thoughts.  Denies being delusional.  Subjective:  Patient is a 69 y.o. male seen for evaluation for Electroconvulsive Therapy. As usual he is pretty quiet.  He admits that he is not feeling good but denies suicidal thoughts or delusions  Treatment Summary:   [x]   Right Unilateral             []  Bilateral   % Energy : 0.3 ms 100%   Impedance: 590 ohms  Seizure Energy Index: 250 V squared  Postictal Suppression Index: No recorded measurement  Seizure Concordance Index: 95%  Medications  Pre Shock: Labetalol 20 mg Brevital 100 mg succinylcholine 150 mg  Post Shock:    Seizure Duration: Very difficult to read.  Amplitude was so low at times it was hard to be sure if there was seizure activity.  He did have motor movement however which I think probably went on for at least 10 to 15 seconds.  The computer read the seizure out to 110 seconds which I think is probably a little long but it is hard to find an exact ending.   Comments: We have him scheduled for next week because of concern that he is deteriorating.  If things are still going badly we may need to switch to ketamine.  I would hope to avoid bilateral treatment if possible given his cognitive problems already.  Lungs:  [x]   Clear to auscultation               []  Other:   Heart:    [x]   Regular rhythm             []  irregular rhythm    [x]   Previous H&P reviewed, patient examined  and there are NO CHANGES                 []   Previous H&P reviewed, patient examined and there are changes noted.   , MD 2/24/202110:41 AM

## 2020-01-23 NOTE — Discharge Instructions (Signed)
1)  The drugs that you have been given will stay in your system until tomorrow so for the       next 24 hours you should not:  A. Drive an automobile  B. Make any legal decisions  C. Drink any alcoholic beverages  2)  You may resume your regular meals upon return home.  3)  A responsible adult must take you home.  Someone should stay with you for a few          hours, then be available by phone for the remainder of the treatment day.  4)  You May experience any of the following symptoms:  Headache, Nausea and a dry mouth (due to the medications you were given),  temporary memory loss and some confusion, or sore muscles (a warm bath  should help this).  If you you experience any of these symptoms let us know on                your return visit.  5)  Report any of the following: any acute discomfort, severe headache, or temperature        greater than 100.5 F.   Also report any unusual redness, swelling, drainage, or pain         at your IV site.    You may report Symptoms to:  ECT PROGRAM- [place] at [facility]          Phone: [phone], ECT Department           or Dr. Shary Key office [phone]  6)  Your next ECT Treatment is Monday March 1  We will call 2 days prior to your scheduled appointment for arrival times.  7)  Nothing to eat or drink after midnight the night before your procedure.  8)  Take morning meds with a sip of water the morning of your procedure.  9)  Other Instructions: Call [phone] to cancel the morning of your procedure due         to illness or emergency.  10) We will call within 72 hours to assess how you are feeling.

## 2020-01-23 NOTE — H&P (Signed)
Christian [name] is an 69 y.o. male.   Chief Complaint: more anxious. On edge. A little confused HPI: recurrent severe bipolar disorder  Past Medical History:  Diagnosis Date  . Bilateral lower extremity edema: chronic with venous stasis changes 06/02/2014  . Bipolar 1 disorder ([facility])   . Bipolar disorder ([facility])   . Chronic kidney disease   . Gout   . Hx of blood clots   . Hypertension   . Mood swings   . OSA on CPAP 06/02/2014  . Other and unspecified hyperlipidemia 06/02/2014  . Type II or unspecified type diabetes mellitus with unspecified complication, uncontrolled 06/02/2014    No past surgical history on file.  Family History  Problem Relation Age of Onset  . Dementia Mother   . Arthritis Father   . Hypertension Father   . Mental illness Sister   . Diabetes Sister   . Heart failure Neg Hx   . Kidney failure Neg Hx   . Cancer Neg Hx    Social History:  reports that he has never smoked. He has never used smokeless tobacco. He reports that he does not drink alcohol or use drugs.  Allergies:  Allergies  Allergen Reactions  . Zyprexa [Olanzapine] Other (See Comments)    Allergy noted by ACT Team. No reaction specified (??)    (Not in a hospital admission)   Results for orders placed or performed during the hospital encounter of 01/22/20 (from the past 48 hour(s))  SARS CORONAVIRUS 2 (TAT 6-24 HRS) Nasopharyngeal Nasopharyngeal Swab     Status: None   Collection Time: 01/22/20 11:53 AM   Specimen: Nasopharyngeal Swab  Result Value Ref Range   SARS Coronavirus 2 NEGATIVE NEGATIVE    Comment: (NOTE) SARS-CoV-2 target nucleic acids are NOT DETECTED. The SARS-CoV-2 RNA is generally detectable in upper and lower respiratory specimens during the acute phase of infection. Negative results do not preclude SARS-CoV-2 infection, do not rule out co-infections with other pathogens, and should not be used as the sole basis for treatment or other patient management  decisions. Negative results must be combined with clinical observations, patient history, and epidemiological information. The expected result is Negative. Fact Sheet for Patients: HairSlick.no Fact Sheet for Healthcare Providers: quierodirigir.com This test is not yet approved or cleared by the [place] FDA and  has been authorized for detection and/or diagnosis of SARS-CoV-2 by FDA under an Emergency Use Authorization (EUA). This EUA will remain  in effect (meaning this test can be used) for the duration of the COVID-19 declaration under Section 56 4(b)(1) of the Act, 21 U.S.C. section 360bbb-3(b)(1), unless the authorization is terminated or revoked sooner. Performed at [facility], [address] [place], [place] [address]    No results found.  Review of Systems  Constitutional: Negative.   HENT: Negative.   Eyes: Negative.   Respiratory: Negative.   Cardiovascular: Negative.   Gastrointestinal: Negative.   Musculoskeletal: Negative.   Skin: Negative.   Neurological: Negative.   Psychiatric/Behavioral: Positive for confusion and dysphoric mood. Negative for suicidal ideas. The patient is nervous/anxious.     Blood pressure (!) 169/104, pulse 98, temperature 98.2 F (36.8 C), temperature source Oral, resp. rate 18, SpO2 100 %. Physical Exam   Assessment/Plan Treatment today. I hope we can avoid admission and instead see him back in about 1 week  [name], MD 01/23/2020, 9:42 AM

## 2020-01-23 NOTE — Transfer of Care (Signed)
Immediate Anesthesia Transfer of Care Note  Patient: Christian [name]  Procedure(s) Performed: ECT TX  Patient Location: PACU  Anesthesia Type:General  Level of Consciousness: drowsy  Airway & Oxygen Therapy: Patient Spontanous Breathing and Patient connected to face mask oxygen  Post-op Assessment: Report given to RN and Post -op Vital signs reviewed and unstable, Anesthesiologist notified  Post vital signs: Reviewed and stable  Last Vitals:  Vitals Value Taken Time  BP 197/126 01/23/20 1102  Temp 36.9 C 01/23/20 1102  Pulse 106 01/23/20 1102  Resp 11 01/23/20 1103  SpO2 100 % 01/23/20 1102  Vitals shown include unvalidated device data.  Last Pain:  Vitals:   01/23/20 1102  TempSrc:   PainSc: Asleep         Complications: No apparent anesthesia complications

## 2020-01-23 NOTE — Anesthesia Postprocedure Evaluation (Signed)
Anesthesia Post Note  Patient: Christian [name]  Procedure(s) Performed: ECT TX  Patient location during evaluation: PACU Anesthesia Type: General Level of consciousness: awake and alert Pain management: pain level controlled Vital Signs Assessment: post-procedure vital signs reviewed and stable Respiratory status: spontaneous breathing, nonlabored ventilation, respiratory function stable and patient connected to nasal cannula oxygen Cardiovascular status: blood pressure returned to baseline and stable Postop Assessment: no apparent nausea or vomiting Anesthetic complications: no     Last Vitals:  Vitals:   01/23/20 1204 01/23/20 1216  BP: (!) 148/98 110/75  Pulse: 98 71  Resp: 16 18  Temp: 36.8 C 36.8 C  SpO2:      Last Pain:  Vitals:   01/23/20 1216  TempSrc: Oral  PainSc: 0-No pain                 Lenard Simmer

## 2020-01-23 NOTE — Anesthesia Procedure Notes (Signed)
Date/Time: 01/23/2020 10:50 AM Performed by: Rosanne Gutting, CRNA Pre-anesthesia Checklist: Patient identified, Emergency Drugs available, Suction available and Patient being monitored Patient Re-evaluated:Patient Re-evaluated prior to induction Oxygen Delivery Method: Circle system utilized Preoxygenation: Pre-oxygenation with 100% oxygen Induction Type: IV induction Ventilation: Mask ventilation without difficulty and Mask ventilation throughout procedure Airway Equipment and Method: Bite block Placement Confirmation: positive ETCO2 Dental Injury: Teeth and Oropharynx as per pre-operative assessment

## 2020-01-23 NOTE — Anesthesia Preprocedure Evaluation (Signed)
Anesthesia Evaluation  Patient identified by MRN, date of birth, ID band Patient awake    Reviewed: Allergy & Precautions, NPO status , Patient's Chart, lab work & pertinent test results  History of Anesthesia Complications Negative for: history of anesthetic complications  Airway Mallampati: III  TM Distance: >3 FB Neck ROM: Limited    Dental  (+) Poor Dentition, Missing   Pulmonary shortness of breath and with exertion, sleep apnea and Continuous Positive Airway Pressure Ventilation , neg COPD, neg recent URI,    Pulmonary exam normal        Cardiovascular Exercise Tolerance: Poor hypertension, Pt. on medications and Pt. on home beta blockers (-) angina+ Peripheral Vascular Disease  (-) Past MI and (-) Cardiac Stents Normal cardiovascular exam(-) dysrhythmias (-) Valvular Problems/Murmurs  On elequis.   Neuro/Psych PSYCHIATRIC DISORDERS Depression Bipolar Disorder negative neurological ROS     GI/Hepatic negative GI ROS, Neg liver ROS,   Endo/Other  diabetes, Well Controlled, Type 2BG 88.  Renal/GU CRFRenal disease  negative genitourinary   Musculoskeletal negative musculoskeletal ROS (+)   Abdominal (+) + obese,   Peds negative pediatric ROS (+)  Hematology negative hematology ROS (+)   Anesthesia Other Findings Past Medical History: 06/02/2014: Bilateral lower extremity edema: chronic with venous stasis  changes No date: Bipolar 1 disorder (HCC) No date: Bipolar disorder (HCC) No date: Chronic kidney disease No date: Gout No date: Hx of blood clots No date: Hypertension No date: Mood swings 06/02/2014: OSA on CPAP 06/02/2014: Other and unspecified hyperlipidemia 06/02/2014: Type II or unspecified type diabetes mellitus with  unspecified complication, uncontrolled   Reproductive/Obstetrics                             Anesthesia Physical  Anesthesia Plan  ASA: III  Anesthesia  Plan: General   Post-op Pain Management:    Induction: Intravenous  PONV Risk Score and Plan: 2 and TIVA  Airway Management Planned: Mask  Additional Equipment:   Intra-op Plan:   Post-operative Plan:   Informed Consent: I have reviewed the patients History and Physical, chart, labs and discussed the procedure including the risks, benefits and alternatives for the proposed anesthesia with the patient or authorized representative who has indicated his/her understanding and acceptance.       Plan Discussed with: CRNA  Anesthesia Plan Comments:         Anesthesia Quick Evaluation

## 2020-01-27 ENCOUNTER — Other Ambulatory Visit: Payer: Self-pay | Admitting: Psychiatry

## 2020-01-28 ENCOUNTER — Encounter
Admission: RE | Admit: 2020-01-28 | Discharge: 2020-01-28 | Disposition: A | Discharge status: Home / Self Care | Payer: Medicare PPO | Source: Ambulatory Visit | Attending: Psychiatry | Admitting: Psychiatry

## 2020-01-28 ENCOUNTER — Other Ambulatory Visit: Payer: Self-pay

## 2020-01-28 ENCOUNTER — Encounter: Payer: Self-pay | Admitting: Anesthesiology

## 2020-01-28 DIAGNOSIS — E119 Type 2 diabetes mellitus without complications: Secondary | ICD-10-CM | POA: Diagnosis not present

## 2020-01-28 DIAGNOSIS — Z8249 Family history of ischemic heart disease and other diseases of the circulatory system: Secondary | ICD-10-CM | POA: Diagnosis not present

## 2020-01-28 DIAGNOSIS — I480 Paroxysmal atrial fibrillation: Secondary | ICD-10-CM | POA: Diagnosis not present

## 2020-01-28 DIAGNOSIS — E43 Unspecified severe protein-calorie malnutrition: Secondary | ICD-10-CM | POA: Insufficient documentation

## 2020-01-28 DIAGNOSIS — F319 Bipolar disorder, unspecified: Secondary | ICD-10-CM | POA: Diagnosis not present

## 2020-01-28 DIAGNOSIS — G4733 Obstructive sleep apnea (adult) (pediatric): Secondary | ICD-10-CM | POA: Insufficient documentation

## 2020-01-28 DIAGNOSIS — N189 Chronic kidney disease, unspecified: Secondary | ICD-10-CM | POA: Diagnosis not present

## 2020-01-28 DIAGNOSIS — F333 Major depressive disorder, recurrent, severe with psychotic symptoms: Secondary | ICD-10-CM | POA: Diagnosis not present

## 2020-01-28 DIAGNOSIS — Z882 Allergy status to sulfonamides status: Secondary | ICD-10-CM | POA: Insufficient documentation

## 2020-01-28 DIAGNOSIS — Z818 Family history of other mental and behavioral disorders: Secondary | ICD-10-CM | POA: Diagnosis not present

## 2020-01-28 DIAGNOSIS — Z86711 Personal history of pulmonary embolism: Secondary | ICD-10-CM | POA: Diagnosis not present

## 2020-01-28 DIAGNOSIS — Z833 Family history of diabetes mellitus: Secondary | ICD-10-CM | POA: Diagnosis not present

## 2020-01-28 DIAGNOSIS — E1122 Type 2 diabetes mellitus with diabetic chronic kidney disease: Secondary | ICD-10-CM | POA: Diagnosis not present

## 2020-01-28 DIAGNOSIS — I129 Hypertensive chronic kidney disease with stage 1 through stage 4 chronic kidney disease, or unspecified chronic kidney disease: Secondary | ICD-10-CM | POA: Insufficient documentation

## 2020-01-28 MED ORDER — SUCCINYLCHOLINE CHLORIDE 20 MG/ML IJ SOLN
INTRAMUSCULAR | Status: DC | PRN
  Administered 2020-01-28: 150 mg via INTRAVENOUS

## 2020-01-28 MED ORDER — METHOHEXITAL SODIUM 100 MG/10ML IV SOSY
PREFILLED_SYRINGE | INTRAVENOUS | Status: DC | PRN
  Administered 2020-01-28: 100 mg via INTRAVENOUS

## 2020-01-28 MED ORDER — METHOHEXITAL SODIUM 0.5 G IJ SOLR
INTRAMUSCULAR | Status: AC
  Filled 2020-01-28: qty 500

## 2020-01-28 MED ORDER — LABETALOL HCL 5 MG/ML IV SOLN
INTRAVENOUS | Status: DC | PRN
  Administered 2020-01-28 (×2): 20 mg via INTRAVENOUS

## 2020-01-28 MED ORDER — SODIUM CHLORIDE 0.9 % IV SOLN: 500.0000 mL | Freq: Once | INTRAVENOUS | Status: AC

## 2020-01-28 MED ORDER — SUCCINYLCHOLINE CHLORIDE 20 MG/ML IJ SOLN
INTRAMUSCULAR | Status: AC
  Filled 2020-01-28: qty 1

## 2020-01-28 NOTE — H&P (Signed)
Christian [name] is an 69 y.o. male.   Chief Complaint: very anxious and depressed HPI: recurrent bipolar with psychotic depression  Past Medical History:  Diagnosis Date  . Bilateral lower extremity edema: chronic with venous stasis changes 06/02/2014  . Bipolar 1 disorder (HCC)   . Bipolar disorder (HCC)   . Chronic kidney disease   . Gout   . Hx of blood clots   . Hypertension   . Mood swings   . OSA on CPAP 06/02/2014  . Other and unspecified hyperlipidemia 06/02/2014  . Type II or unspecified type diabetes mellitus with unspecified complication, uncontrolled 06/02/2014    History reviewed. No pertinent surgical history.  Family History  Problem Relation Age of Onset  . Dementia Mother   . Arthritis Father   . Hypertension Father   . Mental illness Sister   . Diabetes Sister   . Heart failure Neg Hx   . Kidney failure Neg Hx   . Cancer Neg Hx    Social History:  reports that he has never smoked. He has never used smokeless tobacco. He reports that he does not drink alcohol or use drugs.  Allergies:  Allergies  Allergen Reactions  . Zyprexa [Olanzapine] Other (See Comments)    Allergy noted by ACT Team. No reaction specified (??)    (Not in a hospital admission)   No results found for this or any previous visit (from the past 48 hour(s)). No results found.  Review of Systems  Constitutional: Negative.   HENT: Negative.   Eyes: Negative.   Respiratory: Negative.   Cardiovascular: Negative.   Gastrointestinal: Negative.   Musculoskeletal: Negative.   Skin: Negative.   Neurological: Negative.   Psychiatric/Behavioral: Positive for decreased concentration and dysphoric mood. The patient is nervous/anxious.     Blood pressure (!) 141/90, pulse (!) 123, temperature 98.7 F (37.1 C), temperature source Temporal, resp. rate 18, SpO2 99 %. Physical Exam  Nursing note and vitals reviewed. Constitutional: He appears well-developed and well-nourished.  HENT:  Head:  Normocephalic and atraumatic.  Eyes: Pupils are equal, round, and reactive to light. Conjunctivae are normal.  Cardiovascular: Normal heart sounds.  Respiratory: Effort normal.  GI: Soft.  Musculoskeletal:        General: Normal range of motion.     Cervical back: Normal range of motion.  Neurological: He is alert.  Skin: Skin is warm and dry.  Psychiatric: His mood appears anxious. His speech is delayed. He is slowed. Cognition and memory are impaired. He expresses inappropriate judgment. He exhibits a depressed mood. He expresses no homicidal and no suicidal ideation.     Assessment/Plan Come back Wednesday. Notify outpatient team  [name], MD 01/28/2020, 9:40 AM

## 2020-01-28 NOTE — Transfer of Care (Signed)
Immediate Anesthesia Transfer of Care Note  Patient: Christian [name]  Procedure(s) Performed: ECT TX  Patient Location: PACU  Anesthesia Type:General  Level of Consciousness: awake  Airway & Oxygen Therapy: Patient connected to face mask oxygen  Post-op Assessment: Post -op Vital signs reviewed and stable  Post vital signs: stable  Last Vitals:  Vitals Value Taken Time  BP 163/109 01/28/20 1025  Temp    Pulse 104 01/28/20 1026  Resp 15 01/28/20 1026  SpO2 99 % 01/28/20 1026  Vitals shown include unvalidated device data.  Last Pain:  Vitals:   01/28/20 0929  TempSrc:   PainSc: 0-No pain         Complications: No apparent anesthesia complications

## 2020-01-28 NOTE — Anesthesia Procedure Notes (Signed)
Procedure Name: General with mask airway Date/Time: 01/28/2020 10:11 AM Performed by: Irving Burton, CRNA Pre-anesthesia Checklist: Patient identified, Emergency Drugs available, Suction available and Patient being monitored Patient Re-evaluated:Patient Re-evaluated prior to induction Oxygen Delivery Method: Circle system utilized Induction Type: IV induction Ventilation: Oral airway inserted - appropriate to patient size and Two handed mask ventilation required Dental Injury: Teeth and Oropharynx as per pre-operative assessment

## 2020-01-28 NOTE — Discharge Instructions (Signed)
1)  The drugs that you have been given will stay in your system until tomorrow so for the       next 24 hours you should not:  A. Drive an automobile  B. Make any legal decisions  C. Drink any alcoholic beverages  2)  You may resume your regular meals upon return home.  3)  A responsible adult must take you home.  Someone should stay with you for a few          hours, then be available by phone for the remainder of the treatment day.  4)  You May experience any of the following symptoms:  Headache, Nausea and a dry mouth (due to the medications you were given),  temporary memory loss and some confusion, or sore muscles (a warm bath  should help this).  If you you experience any of these symptoms let us know on                your return visit.  5)  Report any of the following: any acute discomfort, severe headache, or temperature        greater than 100.5 F.   Also report any unusual redness, swelling, drainage, or pain         at your IV site.    You may report Symptoms to:  ECT PROGRAM- [place] [facility]          Phone: [phone], ECT Department           or Dr. Shary Key office [phone]  6)  Your next ECT Treatment is Wednesday at 9:00  We will call 2 days prior to your scheduled appointment for arrival times.  7)  Nothing to eat or drink after midnight the night before your procedure.  8)  Take morning meds with a sip of water the morning of your procedure.  9)  Other Instructions: Call [phone] to cancel the morning of your procedure due         to illness or emergency.  10) We will call within 72 hours to assess how you are feeling.

## 2020-01-28 NOTE — Procedures (Signed)
ECT SERVICES Physician's Interval Evaluation & Treatment Note  Patient Identification: Christian [name] MRN:  [id number] Date of Evaluation:  01/28/2020 TX #: 3  MADRS:   MMSE:   P.E. Findings:  No real change to physical exam  Psychiatric Interval Note:  Significantly worse.  Not talking.  Resistant.  Very anxious.  Subjective:  Patient is a 69 y.o. male seen for evaluation for Electroconvulsive Therapy. Not able to articulately describe clearly feeling bad.  Treatment Summary:   [x]   Right Unilateral             []  Bilateral   % Energy : 0.3 ms 100%   Impedance: 710 ohms  Seizure Energy Index: 582 V squared  Postictal Suppression Index: 25%  Seizure Concordance Index: 75%  Medications  Pre Shock: Labetalol 20 mg Brevital 100 mg succinylcholine 150 mg  Post Shock:    Seizure Duration: 44 seconds EMG 79-second EEG   Comments: We will get a see him back on Wednesday I have also sent a message to the act team.  Lungs:  [x]   Clear to auscultation               []  Other:   Heart:    [x]   Regular rhythm             []  irregular rhythm    [x]   Previous H&P reviewed, patient examined and there are NO CHANGES                 []   Previous H&P reviewed, patient examined and there are changes noted.   , MD 3/1/202110:18 AM

## 2020-01-28 NOTE — Anesthesia Preprocedure Evaluation (Addendum)
Anesthesia Evaluation  Patient identified by MRN, date of birth, ID band Patient awake    Reviewed: Allergy & Precautions, H&P , NPO status , Patient's Chart, lab work & pertinent test results  History of Anesthesia Complications Negative for: history of anesthetic complications  Airway Mallampati: IV  TM Distance: >3 FB Neck ROM: Full   Comment: Poor effort at mouth opening Dental no notable dental hx. (+) Edentulous Upper   Pulmonary shortness of breath, sleep apnea and Continuous Positive Airway Pressure Ventilation ,  H/o pulmonary embolism   Pulmonary exam normal breath sounds clear to auscultation       Cardiovascular hypertension, On Medications + dysrhythmias Atrial Fibrillation  Rhythm:Irregular Rate:Normal - Systolic murmurs    Neuro/Psych PSYCHIATRIC DISORDERS Depression Bipolar Disorder Schizophrenia negative neurological ROS     GI/Hepatic negative GI ROS, Neg liver ROS,   Endo/Other  diabetes, Type 2, Insulin Dependent, Oral Hypoglycemic Agents  Renal/GU CRFRenal disease  negative genitourinary   Musculoskeletal   Abdominal   Peds  Hematology negative hematology ROS (+)   Anesthesia Other Findings Past Medical History: 06/02/2014: Bilateral lower extremity edema: chronic with venous stasis  changes No date: Bipolar 1 disorder (HCC) No date: Bipolar disorder (HCC) No date: Chronic kidney disease No date: Gout No date: Hx of blood clots No date: Hypertension No date: Mood swings 06/02/2014: OSA on CPAP 06/02/2014: Other and unspecified hyperlipidemia 06/02/2014: Type II or unspecified type diabetes mellitus with  unspecified complication, uncontrolled  No past surgical history on file.     Reproductive/Obstetrics negative OB ROS                            Anesthesia Physical Anesthesia Plan  ASA: III  Anesthesia Plan: General   Post-op Pain Management:    Induction:  Intravenous  PONV Risk Score and Plan: 2 and Ondansetron and TIVA  Airway Management Planned: Mask  Additional Equipment: None  Intra-op Plan:   Post-operative Plan:   Informed Consent: I have reviewed the patients History and Physical, chart, labs and discussed the procedure including the risks, benefits and alternatives for the proposed anesthesia with the patient or authorized representative who has indicated his/her understanding and acceptance.     Dental Advisory Given  Plan Discussed with: CRNA and Surgeon  Anesthesia Plan Comments: (Discussed risks of anesthesia with patient, including PONV, muscle aches. Rare risks discussed as well, such as cardiorespiratory sequelae, need for airway intervention. Patient understands.)       Anesthesia Quick Evaluation

## 2020-01-28 NOTE — Anesthesia Postprocedure Evaluation (Signed)
Anesthesia Post Note  Patient: Christian [name]  Procedure(s) Performed: ECT TX  Patient location during evaluation: PACU Anesthesia Type: General Level of consciousness: awake and alert Pain management: pain level controlled Vital Signs Assessment: post-procedure vital signs reviewed and stable Respiratory status: spontaneous breathing, nonlabored ventilation, respiratory function stable and patient connected to nasal cannula oxygen Cardiovascular status: blood pressure returned to baseline and stable Postop Assessment: no apparent nausea or vomiting Anesthetic complications: no     Last Vitals:  Vitals:   01/28/20 1027 01/28/20 1036  BP:  (!) 147/96  Pulse:  (!) 103  Resp:  15  Temp: 37.1 C   SpO2:  97%    Last Pain:  Vitals:   01/28/20 1036  TempSrc:   PainSc: 0-No pain                 Corinda Gubler

## 2020-01-29 ENCOUNTER — Other Ambulatory Visit: Payer: Self-pay | Admitting: Psychiatry

## 2020-01-30 ENCOUNTER — Other Ambulatory Visit: Payer: Self-pay

## 2020-01-30 ENCOUNTER — Encounter: Payer: Self-pay | Admitting: Anesthesiology

## 2020-01-30 ENCOUNTER — Inpatient Hospital Stay: Admission: RE | Admit: 2020-01-30 | Payer: Medicare PPO | Source: Ambulatory Visit

## 2020-01-30 ENCOUNTER — Encounter: Payer: Self-pay | Admitting: Psychiatry

## 2020-01-30 ENCOUNTER — Ambulatory Visit: Payer: Medicare PPO | Admitting: Podiatry

## 2020-01-30 ENCOUNTER — Inpatient Hospital Stay
Admission: RE | Admit: 2020-01-30 | Discharge: 2020-02-05 | DRG: 885 | Disposition: A | Discharge status: Home / Self Care | Payer: Medicare PPO | Source: Intra-hospital | Attending: Psychiatry | Admitting: Psychiatry

## 2020-01-30 ENCOUNTER — Emergency Department
Admission: EM | Admit: 2020-01-30 | Discharge: 2020-01-30 | Disposition: A | Discharge status: Other Acute Inpatient Hospital | Payer: Medicare PPO | Attending: Emergency Medicine | Admitting: Emergency Medicine

## 2020-01-30 DIAGNOSIS — Z8249 Family history of ischemic heart disease and other diseases of the circulatory system: Secondary | ICD-10-CM | POA: Diagnosis not present

## 2020-01-30 DIAGNOSIS — Z7984 Long term (current) use of oral hypoglycemic drugs: Secondary | ICD-10-CM

## 2020-01-30 DIAGNOSIS — I129 Hypertensive chronic kidney disease with stage 1 through stage 4 chronic kidney disease, or unspecified chronic kidney disease: Secondary | ICD-10-CM | POA: Insufficient documentation

## 2020-01-30 DIAGNOSIS — Z794 Long term (current) use of insulin: Secondary | ICD-10-CM | POA: Diagnosis not present

## 2020-01-30 DIAGNOSIS — E118 Type 2 diabetes mellitus with unspecified complications: Secondary | ICD-10-CM | POA: Diagnosis present

## 2020-01-30 DIAGNOSIS — F315 Bipolar disorder, current episode depressed, severe, with psychotic features: Principal | ICD-10-CM | POA: Diagnosis present

## 2020-01-30 DIAGNOSIS — E785 Hyperlipidemia, unspecified: Secondary | ICD-10-CM | POA: Diagnosis present

## 2020-01-30 DIAGNOSIS — F419 Anxiety disorder, unspecified: Secondary | ICD-10-CM | POA: Diagnosis present

## 2020-01-30 DIAGNOSIS — G4733 Obstructive sleep apnea (adult) (pediatric): Secondary | ICD-10-CM | POA: Diagnosis present

## 2020-01-30 DIAGNOSIS — E1122 Type 2 diabetes mellitus with diabetic chronic kidney disease: Secondary | ICD-10-CM | POA: Diagnosis present

## 2020-01-30 DIAGNOSIS — Z8261 Family history of arthritis: Secondary | ICD-10-CM | POA: Diagnosis not present

## 2020-01-30 DIAGNOSIS — I480 Paroxysmal atrial fibrillation: Secondary | ICD-10-CM | POA: Diagnosis not present

## 2020-01-30 DIAGNOSIS — Z833 Family history of diabetes mellitus: Secondary | ICD-10-CM | POA: Diagnosis not present

## 2020-01-30 DIAGNOSIS — Z86718 Personal history of other venous thrombosis and embolism: Secondary | ICD-10-CM

## 2020-01-30 DIAGNOSIS — I10 Essential (primary) hypertension: Secondary | ICD-10-CM | POA: Diagnosis present

## 2020-01-30 DIAGNOSIS — N189 Chronic kidney disease, unspecified: Secondary | ICD-10-CM | POA: Diagnosis present

## 2020-01-30 DIAGNOSIS — I872 Venous insufficiency (chronic) (peripheral): Secondary | ICD-10-CM | POA: Diagnosis present

## 2020-01-30 DIAGNOSIS — F319 Bipolar disorder, unspecified: Secondary | ICD-10-CM | POA: Insufficient documentation

## 2020-01-30 DIAGNOSIS — R4689 Other symptoms and signs involving appearance and behavior: Secondary | ICD-10-CM

## 2020-01-30 DIAGNOSIS — Z20822 Contact with and (suspected) exposure to covid-19: Secondary | ICD-10-CM | POA: Diagnosis not present

## 2020-01-30 DIAGNOSIS — Z818 Family history of other mental and behavioral disorders: Secondary | ICD-10-CM | POA: Diagnosis not present

## 2020-01-30 DIAGNOSIS — Z86711 Personal history of pulmonary embolism: Secondary | ICD-10-CM | POA: Insufficient documentation

## 2020-01-30 DIAGNOSIS — Z79899 Other long term (current) drug therapy: Secondary | ICD-10-CM | POA: Diagnosis not present

## 2020-01-30 DIAGNOSIS — Z7901 Long term (current) use of anticoagulants: Secondary | ICD-10-CM | POA: Diagnosis not present

## 2020-01-30 DIAGNOSIS — Z888 Allergy status to other drugs, medicaments and biological substances status: Secondary | ICD-10-CM

## 2020-01-30 DIAGNOSIS — R451 Restlessness and agitation: Secondary | ICD-10-CM | POA: Diagnosis present

## 2020-01-30 LAB — URINE DRUG SCREEN, QUALITATIVE (ARMC ONLY)
Amphetamines, Ur Screen: NOT DETECTED
Barbiturates, Ur Screen: NOT DETECTED
Benzodiazepine, Ur Scrn: NOT DETECTED
Cannabinoid 50 Ng, Ur ~~LOC~~: NOT DETECTED
Cocaine Metabolite,Ur ~~LOC~~: NOT DETECTED
MDMA (Ecstasy)Ur Screen: NOT DETECTED
Methadone Scn, Ur: NOT DETECTED
Opiate, Ur Screen: NOT DETECTED
Phencyclidine (PCP) Ur S: NOT DETECTED
Tricyclic, Ur Screen: POSITIVE — AB

## 2020-01-30 LAB — RESPIRATORY PANEL BY RT PCR (FLU A&B, COVID)
Influenza A by PCR: NEGATIVE
Influenza B by PCR: NEGATIVE
SARS Coronavirus 2 by RT PCR: NEGATIVE

## 2020-01-30 LAB — CBC
HCT: 42.1 % (ref 39.0–52.0)
Hemoglobin: 13.3 g/dL (ref 13.0–17.0)
MCH: 26.2 pg (ref 26.0–34.0)
MCHC: 31.6 g/dL (ref 30.0–36.0)
MCV: 83 fL (ref 80.0–100.0)
Platelets: 210 10*3/uL (ref 150–400)
RBC: 5.07 MIL/uL (ref 4.22–5.81)
RDW: 13.3 % (ref 11.5–15.5)
WBC: 4 10*3/uL (ref 4.0–10.5)
nRBC: 0 % (ref 0.0–0.2)

## 2020-01-30 LAB — COMPREHENSIVE METABOLIC PANEL
ALT: 17 U/L (ref 0–44)
AST: 16 U/L (ref 15–41)
Albumin: 3.6 g/dL (ref 3.5–5.0)
Alkaline Phosphatase: 101 U/L (ref 38–126)
Anion gap: 13 (ref 5–15)
BUN: 13 mg/dL (ref 8–23)
CO2: 24 mmol/L (ref 22–32)
Calcium: 9 mg/dL (ref 8.9–10.3)
Chloride: 101 mmol/L (ref 98–111)
Creatinine, Ser: 1.13 mg/dL (ref 0.61–1.24)
GFR calc Af Amer: >60 mL/min (ref >60)
GFR calc non Af Amer: >60 mL/min (ref >60)
Glucose, Bld: 227 mg/dL — ABNORMAL HIGH (ref 70–99)
Potassium: 3.4 mmol/L — ABNORMAL LOW (ref 3.5–5.1)
Sodium: 138 mmol/L (ref 135–145)
Total Bilirubin: 0.6 mg/dL (ref 0.3–1.2)
Total Protein: 7.2 g/dL (ref 6.5–8.1)

## 2020-01-30 LAB — GLUCOSE, CAPILLARY: Glucose-Capillary: 237 mg/dL — ABNORMAL HIGH (ref 70–99)

## 2020-01-30 LAB — ACETAMINOPHEN LEVEL: Acetaminophen (Tylenol), Serum: <10 ug/mL — ABNORMAL LOW (ref 10–30)

## 2020-01-30 LAB — HEMOGLOBIN A1C
Hgb A1c MFr Bld: 7.1 % — ABNORMAL HIGH (ref 4.8–5.6)
Mean Plasma Glucose: 157.07 mg/dL

## 2020-01-30 LAB — ETHANOL: Alcohol, Ethyl (B): <10 mg/dL (ref <10)

## 2020-01-30 LAB — SALICYLATE LEVEL: Salicylate Lvl: <7 mg/dL — ABNORMAL LOW (ref 7.0–30.0)

## 2020-01-30 MED ORDER — MAGNESIUM HYDROXIDE 400 MG/5ML PO SUSP: 30.0000 mL | Freq: Every day | ORAL | Status: DC | PRN

## 2020-01-30 MED ORDER — ALUM & MAG HYDROXIDE-SIMETH 200-200-20 MG/5ML PO SUSP: 30.0000 mL | ORAL | Status: DC | PRN

## 2020-01-30 MED ORDER — CLONAZEPAM 0.5 MG PO TABS
0.5000 mg | ORAL_TABLET | Freq: Two times a day (BID) | ORAL | Status: DC
  Administered 2020-01-30 – 2020-02-05 (×12): 0.5 mg via ORAL
  Filled 2020-01-30 (×12): qty 1

## 2020-01-30 MED ORDER — ATORVASTATIN CALCIUM 20 MG PO TABS: 10.0000 mg | ORAL_TABLET | Freq: Every day | ORAL | Status: DC

## 2020-01-30 MED ORDER — INSULIN GLARGINE 100 UNIT/ML ~~LOC~~ SOLN
34.0000 [IU] | Freq: Every day | SUBCUTANEOUS | Status: DC
  Administered 2020-01-31 – 2020-02-03 (×4): 34 [IU] via SUBCUTANEOUS
  Filled 2020-01-30 (×4): qty 0.34

## 2020-01-30 MED ORDER — ASPIRIN EC 325 MG PO TBEC
325.0000 mg | DELAYED_RELEASE_TABLET | Freq: Every day | ORAL | Status: DC
  Administered 2020-01-30: 325 mg via ORAL
  Filled 2020-01-30: qty 1

## 2020-01-30 MED ORDER — RIVAROXABAN 20 MG PO TABS
20.0000 mg | ORAL_TABLET | Freq: Every day | ORAL | Status: DC
  Administered 2020-01-30 – 2020-02-04 (×6): 20 mg via ORAL
  Filled 2020-01-30 (×7): qty 1

## 2020-01-30 MED ORDER — ARIPIPRAZOLE 10 MG PO TABS
10.0000 mg | ORAL_TABLET | Freq: Every day | ORAL | Status: DC
  Administered 2020-01-31: 10 mg via ORAL
  Filled 2020-01-30: qty 1

## 2020-01-30 MED ORDER — METFORMIN HCL ER 500 MG PO TB24
1000.0000 mg | ORAL_TABLET | Freq: Every day | ORAL | Status: DC
  Administered 2020-01-31 – 2020-02-03 (×4): 1000 mg via ORAL
  Filled 2020-01-30 (×4): qty 2

## 2020-01-30 MED ORDER — INSULIN GLARGINE 100 UNIT/ML ~~LOC~~ SOLN
34.0000 [IU] | Freq: Every day | SUBCUTANEOUS | Status: DC
  Filled 2020-01-30 (×2): qty 0.34

## 2020-01-30 MED ORDER — FUROSEMIDE 40 MG PO TABS
20.0000 mg | ORAL_TABLET | Freq: Every day | ORAL | Status: DC
  Administered 2020-01-30: 20 mg via ORAL
  Filled 2020-01-30: qty 1

## 2020-01-30 MED ORDER — HALOPERIDOL LACTATE 5 MG/ML IJ SOLN
5.0000 mg | Freq: Once | INTRAMUSCULAR | Status: DC
  Filled 2020-01-30: qty 1

## 2020-01-30 MED ORDER — ACETAMINOPHEN 325 MG PO TABS: 650.0000 mg | ORAL_TABLET | Freq: Four times a day (QID) | ORAL | Status: DC | PRN

## 2020-01-30 MED ORDER — LISINOPRIL 20 MG PO TABS
20.0000 mg | ORAL_TABLET | Freq: Every day | ORAL | Status: DC
  Administered 2020-01-31: 20 mg via ORAL
  Filled 2020-01-30: qty 1

## 2020-01-30 MED ORDER — LISINOPRIL 10 MG PO TABS
20.0000 mg | ORAL_TABLET | Freq: Every day | ORAL | Status: DC
  Administered 2020-01-30: 20 mg via ORAL
  Filled 2020-01-30: qty 2

## 2020-01-30 MED ORDER — METOPROLOL SUCCINATE ER 100 MG PO TB24
100.0000 mg | ORAL_TABLET | Freq: Every day | ORAL | Status: DC
  Administered 2020-01-31 – 2020-02-03 (×4): 100 mg via ORAL
  Filled 2020-01-30 (×5): qty 1

## 2020-01-30 MED ORDER — DIVALPROEX SODIUM 250 MG PO DR TAB
375.0000 mg | DELAYED_RELEASE_TABLET | Freq: Two times a day (BID) | ORAL | Status: DC
  Administered 2020-01-30: 375 mg via ORAL
  Filled 2020-01-30 (×2): qty 1

## 2020-01-30 MED ORDER — INSULIN ASPART 100 UNIT/ML ~~LOC~~ SOLN
0.0000 [IU] | Freq: Three times a day (TID) | SUBCUTANEOUS | Status: DC
  Administered 2020-01-30: 5 [IU] via SUBCUTANEOUS
  Filled 2020-01-30: qty 1

## 2020-01-30 MED ORDER — LIOTHYRONINE SODIUM 25 MCG PO TABS
25.0000 ug | ORAL_TABLET | Freq: Every day | ORAL | Status: DC
  Administered 2020-01-31 – 2020-02-05 (×6): 25 ug via ORAL
  Filled 2020-01-30 (×7): qty 1

## 2020-01-30 MED ORDER — LORAZEPAM 2 MG/ML IJ SOLN
2.0000 mg | Freq: Once | INTRAMUSCULAR | Status: DC
  Filled 2020-01-30: qty 1

## 2020-01-30 MED ORDER — RIVAROXABAN 20 MG PO TABS
20.0000 mg | ORAL_TABLET | Freq: Every day | ORAL | Status: DC
  Filled 2020-01-30: qty 1

## 2020-01-30 MED ORDER — DIPHENHYDRAMINE HCL 50 MG/ML IJ SOLN
50.0000 mg | Freq: Once | INTRAMUSCULAR | Status: DC
  Filled 2020-01-30: qty 1

## 2020-01-30 MED ORDER — METFORMIN HCL ER 500 MG PO TB24
1000.0000 mg | ORAL_TABLET | Freq: Every day | ORAL | Status: DC
  Filled 2020-01-30: qty 2

## 2020-01-30 MED ORDER — ASPIRIN 325 MG PO TABS
325.0000 mg | ORAL_TABLET | Freq: Every day | ORAL | Status: DC
  Administered 2020-01-31 – 2020-02-05 (×6): 325 mg via ORAL
  Filled 2020-01-30 (×7): qty 1

## 2020-01-30 MED ORDER — LIOTHYRONINE SODIUM 25 MCG PO TABS
25.0000 ug | ORAL_TABLET | Freq: Every day | ORAL | Status: DC
  Filled 2020-01-30: qty 1

## 2020-01-30 MED ORDER — ARIPIPRAZOLE 10 MG PO TABS
10.0000 mg | ORAL_TABLET | Freq: Every day | ORAL | Status: DC
  Administered 2020-01-30: 10 mg via ORAL
  Filled 2020-01-30: qty 1

## 2020-01-30 MED ORDER — GLIMEPIRIDE 2 MG PO TABS
2.0000 mg | ORAL_TABLET | Freq: Every day | ORAL | Status: DC
  Administered 2020-01-31 – 2020-02-03 (×4): 2 mg via ORAL
  Filled 2020-01-30 (×4): qty 1

## 2020-01-30 MED ORDER — FUROSEMIDE 20 MG PO TABS
20.0000 mg | ORAL_TABLET | Freq: Every day | ORAL | Status: DC
  Administered 2020-01-31 – 2020-02-05 (×6): 20 mg via ORAL
  Filled 2020-01-30 (×7): qty 1

## 2020-01-30 MED ORDER — DIVALPROEX SODIUM 250 MG PO DR TAB
375.0000 mg | DELAYED_RELEASE_TABLET | Freq: Two times a day (BID) | ORAL | Status: DC
  Administered 2020-01-30 – 2020-02-01 (×4): 375 mg via ORAL
  Filled 2020-01-30 (×6): qty 1

## 2020-01-30 MED ORDER — INSULIN ASPART 100 UNIT/ML ~~LOC~~ SOLN
0.0000 [IU] | Freq: Three times a day (TID) | SUBCUTANEOUS | Status: DC
  Administered 2020-01-31: 3 [IU] via SUBCUTANEOUS
  Administered 2020-01-31: 2 [IU] via SUBCUTANEOUS
  Administered 2020-02-02: 3 [IU] via SUBCUTANEOUS
  Administered 2020-02-04 – 2020-02-05 (×2): 2 [IU] via SUBCUTANEOUS
  Filled 2020-01-30 (×4): qty 1

## 2020-01-30 MED ORDER — GLIMEPIRIDE 2 MG PO TABS
2.0000 mg | ORAL_TABLET | Freq: Every day | ORAL | Status: DC
  Filled 2020-01-30: qty 1

## 2020-01-30 MED ORDER — CLONAZEPAM 0.5 MG PO TABS
0.5000 mg | ORAL_TABLET | Freq: Two times a day (BID) | ORAL | Status: DC
  Administered 2020-01-30: 0.5 mg via ORAL
  Filled 2020-01-30: qty 1

## 2020-01-30 MED ORDER — METOPROLOL SUCCINATE ER 50 MG PO TB24
100.0000 mg | ORAL_TABLET | Freq: Every day | ORAL | Status: DC
  Administered 2020-01-30: 100 mg via ORAL
  Filled 2020-01-30: qty 2

## 2020-01-30 MED ORDER — ATORVASTATIN CALCIUM 20 MG PO TABS
10.0000 mg | ORAL_TABLET | Freq: Every day | ORAL | Status: DC
  Administered 2020-01-31 – 2020-02-04 (×5): 10 mg via ORAL
  Filled 2020-01-30 (×5): qty 1

## 2020-01-30 NOTE — ED Provider Notes (Signed)
[facility] Emergency Department Provider Note  ____________________________________________   First MD Initiated Contact with Patient 01/30/20 [phone]     (approximate)  I have reviewed the triage vital signs and the nursing notes.   HISTORY  Chief Complaint Aggressive Behavior    HPI Christian [name] is a 69 y.o. male with bipolar, diabetes who was plan to get ECT today who comes in for aggressive behavior.  Patient was IVC.  Patient was initially very aggressive.  Upon my arrival to the room patient was calm and cooperative.  He declines any cough, shortness of breath.  He denies any SI.  Unable to get full HPI due to psychiatric illness.          Past Medical History:  Diagnosis Date  . Bilateral lower extremity edema: chronic with venous stasis changes 06/02/2014  . Bipolar 1 disorder ([place])   . Bipolar disorder ([place])   . Chronic kidney disease   . Gout   . Hx of blood clots   . Hypertension   . Mood swings   . OSA on CPAP 06/02/2014  . Other and unspecified hyperlipidemia 06/02/2014  . Type II or unspecified type diabetes mellitus with unspecified complication, uncontrolled 06/02/2014    Patient Active Problem List   Diagnosis Date Noted  . Encephalopathy   . Paroxysmal atrial fibrillation ([place]) 06/18/2018  . History of pulmonary embolism 06/17/2018  . Generalized weakness 03/27/2018  . Diabetes mellitus type 2 in nonobese ([place]) 03/27/2018  . Pulmonary embolism ([place]) 03/27/2018  . Fall   . Laceration of eyebrow   . Bipolar I disorder, most recent episode depressed, severe w psychosis ([place]) 12/22/2015  . Bipolar affective disorder, depressed, severe, with psychotic behavior ([place])   . Bipolar I disorder, current or most recent episode depressed, with psychotic features ([place])   . Bipolar I disorder, most recent episode depressed ([place])   . Severe bipolar I disorder with depression ([place])   . Bipolar I disorder, most recent episode depressed,  severe with psychotic features ([place])   . Overdose 09/11/2015  . Acute respiratory failure with hypoxia ([place]) 09/05/2015  . Elevated troponin 09/05/2015  . Somnolence 09/05/2015  . Bipolar depression ([place]) 09/05/2015  . Acute bilateral deep vein thrombosis (DVT) of femoral veins ([place]) 09/05/2015  . Bilateral pulmonary embolism ([place]) 09/02/2015  . Labile hypertension   . Pulmonary embolism with acute cor pulmonale ([place])   . Dyspnea   . Orthostatic hypotension   . Bipolar disorder, in partial remission, most recent episode depressed ([place])   . Syncope, near 08/31/2015  . Affective psychosis, bipolar ([place])   . Bipolar affective disorder, depressed, mild ([place]) 08/28/2015  . Pressure ulcer 07/30/2015  . Protein-calorie malnutrition, severe ([place]) 07/30/2015  . Dehydration 07/29/2015  . Diabetes ([place]) 07/09/2015  . UTI (urinary tract infection) 06/19/2014  . Positive blood culture 06/19/2014  . Type 2 diabetes mellitus with complication, without long-term current use of insulin ([place]) 06/02/2014  . Other and unspecified hyperlipidemia 06/02/2014  . Bilateral lower extremity edema: chronic with venous stasis changes 06/02/2014  . Gout 06/02/2014  . Hypertension     No past surgical history on file.  Prior to Admission medications   Medication Sig Start Date End Date Taking? Authorizing Provider  acetaminophen (TYLENOL) 325 MG tablet Take 2 tablets (650 mg total) by mouth every 6 (six) hours as needed for mild pain (or Fever >/= 101). 06/23/18   Georgette Shell, MD  ARIPiprazole (ABILIFY) 10 MG tablet  Take 1 tablet (10 mg total) by mouth daily. 10/24/19   Linwood Dibbles, MD  ARIPiprazole (ABILIFY) 5 MG tablet Take 5 mg by mouth daily.    [provider]  atorvastatin (LIPITOR) 10 MG tablet Take 10 mg by mouth every evening.     [provider]  clonazePAM (KLONOPIN) 0.5 MG tablet Take 0.5 mg by mouth 2 (two) times daily.    [provider]  divalproex (DEPAKOTE)  125 MG DR tablet Take 125 mg by mouth 2 (two) times daily.    [provider]  divalproex (DEPAKOTE) 250 MG DR tablet Take 1 tablet (250 mg total) by mouth 2 (two) times daily. 10/24/19   Linwood Dibbles, MD  docusate sodium (COLACE) 100 MG capsule Take 1 capsule (100 mg total) by mouth 2 (two) times daily. 06/23/18   Alwyn Ren, MD  furosemide (LASIX) 20 MG tablet Take 1 tablet (20 mg total) by mouth daily. 10/24/19   Linwood Dibbles, MD  glimepiride (AMARYL) 2 MG tablet Take 2 mg by mouth daily with breakfast.    [provider]  insulin aspart (NOVOLOG) 100 UNIT/ML injection Inject 0-9 Units into the skin 3 (three) times daily with meals. 06/23/18   Alwyn Ren, MD  Insulin Glargine (LANTUS SOLOSTAR) 100 UNIT/ML Solostar Pen Inject 34 Units into the skin daily with breakfast.    [provider]  liothyronine (CYTOMEL) 25 MCG tablet Take 25 mcg by mouth daily before breakfast.     [provider]  lisinopril (ZESTRIL) 20 MG tablet Take 20 mg by mouth daily with breakfast.    [provider]  LORazepam (ATIVAN) 0.5 MG tablet Take 1 tablet (0.5 mg total) by mouth 3 (three) times daily. 10/23/19   Malvin Johns, MD  metFORMIN (GLUCOPHAGE-XR) 500 MG 24 hr tablet Take 1,000 mg by mouth daily with breakfast.     [provider]  metoprolol succinate (TOPROL-XL) 100 MG 24 hr tablet Take 100 mg by mouth daily. Take with or immediately following a meal.     [provider]  modafinil (PROVIGIL) 100 MG tablet Take 1 tablet (100 mg total) by mouth daily. 10/24/19   Linwood Dibbles, MD  rivaroxaban (XARELTO) 20 MG TABS tablet Take 1 tablet (20 mg total) by mouth daily with supper. 06/23/18   Alwyn Ren, MD    Allergies Zyprexa [olanzapine]  Family History  Problem Relation Age of Onset  . Dementia Mother   . Arthritis Father   . Hypertension Father   . Mental illness Sister   . Diabetes Sister   . Heart failure Neg Hx   .  Kidney failure Neg Hx   . Cancer Neg Hx     Social History Social History   Tobacco Use  . Smoking status: Never Smoker  . Smokeless tobacco: Never Used  Substance Use Topics  . Alcohol use: No  . Drug use: No      Review of Systems Patient is denying any symptoms but history somewhat limited due to psychiatric illness ____________________________________________   PHYSICAL EXAM:  VITAL SIGNS: Blood pressure (!) 176/115, pulse (!) 112, temperature 98.5 F (36.9 C), temperature source Oral, resp. rate 18, height (P) 6' (1.829 m), weight (P) 118.4 kg, SpO2 99 %.   Constitutional: Alert and oriented x2. Well appearing and in no acute distress. Eyes: Conjunctivae are normal. EOMI. Head: Atraumatic. Nose: No congestion/rhinnorhea. Mouth/Throat: Mucous membranes are moist.   Neck: No stridor. Trachea Midline. FROM Cardiovascular: Slightly tachycardic,  regular rhythm. Grossly normal heart sounds.  Good peripheral circulation. Respiratory: Normal respiratory effort.  No retractions. Lungs CTAB. Gastrointestinal: Soft and nontender. No distention. No abdominal bruits.  Musculoskeletal: No lower extremity tenderness nor edema.  No joint effusions. Neurologic:  Normal speech and language. No gross focal neurologic deficits are appreciated.  Skin:  Skin is warm, dry and intact. No rash noted. Psychiatric: Flat affect.  Prior severe aggression but patient now resting comfortable GU: Deferred   ____________________________________________   LABS (all labs ordered are listed, but only abnormal results are displayed)  Labs Reviewed  COMPREHENSIVE METABOLIC PANEL - Abnormal; Notable for the following components:      Result Value   Potassium 3.4 (*)    Glucose, Bld 227 (*)    All other components within normal limits  SALICYLATE LEVEL - Abnormal; Notable for the following components:   Salicylate Lvl <7.0 (*)    All other components within normal limits  ACETAMINOPHEN LEVEL -  Abnormal; Notable for the following components:   Acetaminophen (Tylenol), Serum <10 (*)    All other components within normal limits  RESPIRATORY PANEL BY RT PCR (FLU A&B, COVID)  ETHANOL  CBC  URINE DRUG SCREEN, QUALITATIVE ([facility] ONLY)   ____________________________________________    INITIAL IMPRESSION / ASSESSMENT AND PLAN / ED COURSE  Joycie Peek Herford was evaluated in Emergency Department on 01/30/2020 for the symptoms described in the history of present illness. He was evaluated in the context of the global COVID-19 pandemic, which necessitated consideration that the patient might be at risk for infection with the SARS-CoV-2 virus that causes COVID-19. Institutional protocols and algorithms that pertain to the evaluation of patients at risk for COVID-19 are in a state of rapid change based on information released by regulatory bodies including the CDC and federal and state organizations. These policies and algorithms were followed during the patient's care in the ED.    Patient slightly tachycardic most likely secondary to his agitation from earlier.  Will give some p.o. hydration continue to monitor.  Labs reviewed and unremarkable.   Pt is without any acute medical complaints. No exam findings to suggest medical cause of current presentation. Will order psychiatric screening labs and discuss further w/ psychiatric service.  D/d includes but is not limited to psychiatric disease, behavioral/personality disorder, inadequate socioeconomic support, medical.  Based on HPI, exam, unremarkable labs, no concern for acute medical problem at this time. No rigidity, clonus, hyperthermia, focal neurologic deficit, diaphoresis, tachycardia, meningismus, ataxia, gait abnormality or other finding to suggest this visit represents a non-psychiatric problem. Screening labs reviewed.    Given this, pt medically cleared, to be dispositioned per Psych.  Discussed with the inpatient psychiatrist who is  supposed to have done the ECT who recommended inpatient placement.  Will get Covid swab   ____________________________________________   FINAL CLINICAL IMPRESSION(S) / ED DIAGNOSES   Final diagnoses:  Bipolar 1 disorder (HCC)  Aggression      MEDICATIONS GIVEN DURING THIS VISIT:  Medications - No data to display   ED Discharge Orders    None       Note:  This document was prepared using Dragon voice recognition software and may include unintentional dictation errors.   Concha Se, MD 01/30/20 1136

## 2020-01-30 NOTE — Anesthesia Preprocedure Evaluation (Deleted)
Anesthesia Evaluation  Patient identified by MRN, date of birth, ID band Patient awake    Reviewed: Allergy & Precautions, H&P , NPO status , Patient's Chart, lab work & pertinent test results  Airway        Dental   Pulmonary sleep apnea ,           Cardiovascular hypertension,   H/o PE   Neuro/Psych PSYCHIATRIC DISORDERS Depression Bipolar Disorder Schizophrenia negative neurological ROS     GI/Hepatic negative GI ROS, Neg liver ROS,   Endo/Other  diabetes  Renal/GU Renal disease  negative genitourinary   Musculoskeletal   Abdominal   Peds  Hematology negative hematology ROS (+)   Anesthesia Other Findings Past Medical History: 06/02/2014: Bilateral lower extremity edema: chronic with venous stasis  changes No date: Bipolar 1 disorder (HCC) No date: Bipolar disorder (HCC) No date: Chronic kidney disease No date: Gout No date: Hx of blood clots No date: Hypertension No date: Mood swings 06/02/2014: OSA on CPAP 06/02/2014: Other and unspecified hyperlipidemia 06/02/2014: Type II or unspecified type diabetes mellitus with  unspecified complication, uncontrolled  No past surgical history on file.     Reproductive/Obstetrics negative OB ROS                             Anesthesia Physical Anesthesia Plan  ASA: II  Anesthesia Plan: General   Post-op Pain Management:    Induction:   PONV Risk Score and Plan: Propofol infusion and TIVA  Airway Management Planned:   Additional Equipment:   Intra-op Plan:   Post-operative Plan:   Informed Consent: I have reviewed the patients History and Physical, chart, labs and discussed the procedure including the risks, benefits and alternatives for the proposed anesthesia with the patient or authorized representative who has indicated his/her understanding and acceptance.     Dental Advisory Given  Plan Discussed with:  Anesthesiologist  Anesthesia Plan Comments:         Anesthesia Quick Evaluation

## 2020-01-30 NOTE — ED Notes (Signed)
IVC 
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
## 2020-01-30 NOTE — Plan of Care (Signed)
New Admission  has not started processing goals   Problem: Education: Goal: Knowledge of [facility] Education information/materials will improve Outcome: Not Progressing   Problem: Coping: Goal: Ability to verbalize frustrations and anger appropriately will improve Outcome: Not Progressing Goal: Ability to demonstrate self-control will improve Outcome: Not Progressing   Problem: Safety: Goal: Periods of time without injury will increase Outcome: Not Progressing   Problem: Education: Goal: Utilization of techniques to improve thought processes will improve Outcome: Not Progressing Goal: Knowledge of the prescribed therapeutic regimen will improve Outcome: Not Progressing   Problem: Safety: Goal: Ability to identify and utilize support systems that promote safety will improve Outcome: Not Progressing   Problem: Education: Goal: Knowledge of the prescribed therapeutic regimen will improve Outcome: Not Progressing   Problem: Coping: Goal: Will verbalize feelings Outcome: Not Progressing   Problem: Nutritional: Goal: Ability to achieve adequate nutritional intake will improve Outcome: Not Progressing

## 2020-01-30 NOTE — BH Assessment (Signed)
Assessment Note  Christian [name] is an 69 y.o. male who presents to the ER via his family, after he refused to go to his ECT appointment. Per the report of the patient's family, they started noticing changes in his behaviors but thought it could be addressed with he's upcoming ECT appointment. He was verbally aggressive and yelling, which is uncommon behavior for him.  During the interview, the patient was withdrawn and guarded. He was able to provide appropriate answers to the questions. At times the patient would be confused and had to be asked the question again. He presents with a flat affect. Patient is well known to the [facility] system, due to past presentations of psychosis and catatonia.  Diagnosis: Bipolar  Past Medical History:  Past Medical History:  Diagnosis Date  . Bilateral lower extremity edema: chronic with venous stasis changes 06/02/2014  . Bipolar 1 disorder (HCC)   . Bipolar disorder (HCC)   . Chronic kidney disease   . Gout   . Hx of blood clots   . Hypertension   . Mood swings   . OSA on CPAP 06/02/2014  . Other and unspecified hyperlipidemia 06/02/2014  . Type II or unspecified type diabetes mellitus with unspecified complication, uncontrolled 06/02/2014    No past surgical history on file.  Family History:  Family History  Problem Relation Age of Onset  . Dementia Mother   . Arthritis Father   . Hypertension Father   . Mental illness Sister   . Diabetes Sister   . Heart failure Neg Hx   . Kidney failure Neg Hx   . Cancer Neg Hx     Social History:  reports that he has never smoked. He has never used smokeless tobacco. He reports that he does not drink alcohol or use drugs.  Additional Social History:  Alcohol / Drug Use Pain Medications: See PTA Prescriptions: See PTA Over the Counter: See PTA History of alcohol / drug use?: No history of alcohol / drug abuse Longest period of sobriety (when/how long): n/a  CIWA: CIWA-Ar BP: (!) 176/115 Pulse Rate: (!)  112 COWS:    Allergies:  Allergies  Allergen Reactions  . Zyprexa [Olanzapine] Other (See Comments)    Allergy noted by ACT Team. No reaction specified (??)    Home Medications: (Not in a hospital admission)   OB/GYN Status:  No LMP for male patient.  General Assessment Data Location of Assessment: [facility] ED TTS Assessment: In system Is this a Tele or Face-to-Face Assessment?: Face-to-Face Is this an Initial Assessment or a Re-assessment for this encounter?: Initial Assessment Patient Accompanied by:: N/A Language Other than English: No Living Arrangements: Other (Comment)(Private Home) What gender do you identify as?: Male Marital status: Married Pregnancy Status: No Living Arrangements: Spouse/significant other, Children Can pt return to current living arrangement?: Yes Admission Status: Involuntary Petitioner: ED Attending Is patient capable of signing voluntary admission?: No(Under IVC) Referral Source: Self/Family/Friend Insurance type: [name]  Medical Screening Exam [facility] Walk-in ONLY) Medical Exam completed: Yes  Crisis Care Plan Living Arrangements: Spouse/significant other, Children Legal Guardian: Other:(Wife & Daughter) Name of Psychiatrist: Dr. Toni Amend Name of Therapist: None reported  Education Status Is patient currently in school?: No Is the patient employed, unemployed or receiving disability?: Unemployed, Receiving disability income  Risk to self with the past 6 months Suicidal Ideation: No Has patient been a risk to self within the past 6 months prior to admission? : No Suicidal Intent: No Has patient had any suicidal  intent within the past 6 months prior to admission? : No Is patient at risk for suicide?: No Suicidal Plan?: No Has patient had any suicidal plan within the past 6 months prior to admission? : No Access to Means: No What has been your use of drugs/alcohol within the last 12 months?: Reports of none  Previous  Attempts/Gestures: No Other Self Harm Risks: Reports of none  Triggers for Past Attempts: None known Intentional Self Injurious Behavior: None Family Suicide History: No Recent stressful life event(s): Other (Comment) Persecutory voices/beliefs?: No Depression: No Depression Symptoms: Feeling angry/irritable, Isolating Substance abuse history and/or treatment for substance abuse?: No Suicide prevention information given to non-admitted patients: Not applicable  Risk to Others within the past 6 months Homicidal Ideation: No Does patient have any lifetime risk of violence toward others beyond the six months prior to admission? : No Thoughts of Harm to Others: No Current Homicidal Intent: No Current Homicidal Plan: No Access to Homicidal Means: No Identified Victim: None reported History of harm to others?: No Assessment of Violence: None Noted Violent Behavior Description: Reports of none Does patient have access to weapons?: No Criminal Charges Pending?: No Does patient have a court date: No Is patient on probation?: No  Psychosis Hallucinations: None noted Delusions: None noted  Mental Status Report Appearance/Hygiene: In scrubs, Unremarkable Eye Contact: Fair Motor Activity: Unremarkable Speech: Soft, Logical/coherent Level of Consciousness: Alert Mood: Pleasant, Empty Affect: Flat Anxiety Level: None Thought Processes: Coherent, Relevant Judgement: Partial Orientation: Person, Place Obsessive Compulsive Thoughts/Behaviors: Unable to Assess  Cognitive Functioning Concentration: Decreased Memory: Unable to Assess Is patient IDD: No Insight: Unable to Assess Impulse Control: Unable to Assess Appetite: Poor Have you had any weight changes? : No Change Sleep: No Change Total Hours of Sleep: 8 Vegetative Symptoms: None  ADLScreening [facility] Assessment Services) Patient's cognitive ability adequate to safely complete daily activities?: Yes Patient able to express  need for assistance with ADLs?: Yes Independently performs ADLs?: Yes (appropriate for developmental age)  Prior Inpatient Therapy Prior Inpatient Therapy: Yes Prior Therapy Dates: 11/2015, 08/2015, 07/2015, 06/2015, 05/2015, 04/2014 & 03/2011 Prior Therapy Facilty/Provider(s): [facility], [facility], [facility] Reason for Treatment: Bipolar, Schizophrenia  Prior Outpatient Therapy Prior Outpatient Therapy: Yes Prior Therapy Dates: Current Prior Therapy Facilty/Provider(s): [facility] Reason for Treatment: Bipolar, Schizophrenia  Does patient have an ACCT team?: No Does patient have Intensive In-House Services?  : No Does patient have [facility] services? : No Does patient have P4CC services?: No  ADL Screening (condition at time of admission) Patient's cognitive ability adequate to safely complete daily activities?: Yes Is the patient deaf or have difficulty hearing?: No Does the patient have difficulty seeing, even when wearing glasses/contacts?: No Does the patient have difficulty concentrating, remembering, or making decisions?: No Patient able to express need for assistance with ADLs?: Yes Does the patient have difficulty dressing or bathing?: No Independently performs ADLs?: Yes (appropriate for developmental age) Does the patient have difficulty walking or climbing stairs?: No Weakness of Legs: None Weakness of Arms/Hands: None  Home Assistive Devices/Equipment Home Assistive Devices/Equipment: None  Therapy Consults (therapy consults require a physician order) PT Evaluation Needed: No OT Evalulation Needed: No SLP Evaluation Needed: No Abuse/Neglect Assessment (Assessment to be complete while patient is alone) Abuse/Neglect Assessment Can Be Completed: Yes Physical Abuse: Denies Verbal Abuse: Denies Sexual Abuse: Denies Exploitation of patient/patient's resources: Denies Self-Neglect: Denies Values / Beliefs Cultural Requests During Hospitalization:  None Spiritual Requests During Hospitalization: None Consults Spiritual Care Consult  Needed: No Transition of Care Team Consult Needed: No Advance Directives (For Healthcare) Does Patient Have a Medical Advance Directive?: No Would patient like information on creating a medical advance directive?: Yes (ED - Information included in AVS)  Child/Adolescent Assessment Running Away Risk: Denies(Patient is an adult)  Disposition:  Disposition Initial Assessment Completed for this Encounter: Yes  On Site Evaluation by:   Reviewed with Physician:    Gunnar Fusi MS, LCAS, [facility], [place] Therapeutic Triage Specialist 01/30/2020 3:04 PM

## 2020-01-30 NOTE — Tx Team (Signed)
Initial Treatment Plan 01/30/2020 6:23 PM Christian [name] JFT:[id number]    PATIENT STRESSORS: Health problems Marital or family conflict Medication change or noncompliance   PATIENT STRENGTHS: Capable of independent living Supportive family/friends   PATIENT IDENTIFIED PROBLEMS: Depression 01/30/20  Anxiety 01/30/20  Picking skin 01/30/20  Hearing  Voices 01/30/20               DISCHARGE CRITERIA:  Ability to meet basic life and health needs Improved stabilization in mood, thinking, and/or behavior Medical problems require only outpatient monitoring Motivation to continue treatment in a less acute level of care  PRELIMINARY DISCHARGE PLAN: Outpatient therapy Return to previous living arrangement  PATIENT/FAMILY INVOLVEMENT: This treatment plan has been presented to and reviewed with the patient, Christian [name], and/or family member,  .  The patient and family have been given the opportunity to ask questions and make suggestions.  Crist Infante, RN 01/30/2020, 6:23 PM

## 2020-01-30 NOTE — ED Triage Notes (Signed)
Unable to complete triage at this time.

## 2020-01-30 NOTE — Progress Notes (Signed)
Signed  and held orders are not able to be released. I notified Dr.Clapacs was notified and he said that he will try to get to an computer and fix it. Staff coming on will be told. Torrie Mayers RN

## 2020-01-30 NOTE — ED Triage Notes (Signed)
Pt daughter reports pt has been aggressive. Per daughter pt was supposed to see Dr. Toni Amend this am but they felt he needed to come to the ED.  Dr. Toni Amend reports pt with hx of violence and was scheduled for ECT this am but due to his aggressive behavior he was sent to the ED.   Pt refusing to get out the car. MD aware and emergent IVC papers placed. BPD and security aware and outside with pt.

## 2020-01-30 NOTE — Consult Note (Signed)
[facility] Face-to-Face Psychiatry Consult   Reason for Consult: 69 year old man with a history of bipolar disorder who came to the emergency room today because of agitation and uncooperative behavior with his regular treatment Referring Physician:  Fuller Plan Patient Identification: [name] MRN:  [id number] Principal Diagnosis: <principal problem not specified> Diagnosis:  Active Problems:   * No active hospital problems. *   Total Time spent with patient: 1 hour  Subjective:   Christian [name] is a 69 y.o. male patient admitted with "I just do not know".  HPI: Patient seen and chart reviewed.  Also spoke with his wife and daughter on the telephone who are his legal guardian.  Also patient well-known to me from current and past treatment.  69 year old man with a long history of bipolar disorder who is illness has been progressively worse over the last several years.  He is currently being seen by me for what was supposed to be maintenance ECT treatment after a recent hospital stay.  Patient was scheduled for ECT this morning but became agitated and combative with his wife and daughter.  Trying to grab the steering wheel and the car.  Trying to unlock doors.  Shouting and yelling.  Uncooperative with attempts to receive treatment.  Patient was still agitated uncooperative and somewhat aggressive when he got to the emergency room.  By the time I saw him however he was laying down in a room in the emergency room awake and cooperative although not able to give much information.  Could not really give a clear description of how he was feeling except "not good".  Denied hallucinations.  Denied suicidal or homicidal ideation.  I spoke with his wife and daughter.  The patient is currently living in an independent apartment for the elderly with daily check-in's by his daughter or ex-wife they report that over the last week or so his decline has progressed.  He has become more confused and paranoid.  He is having  even more difficulty taking his medication or taking care of his basic needs than he normally does.  This is despite what appears to be normal compliance with medicine.  No substance abuse involved.  Past Psychiatric History: Patient has a long history of chronic illness.  He had a normal functional adult life except for episodes of depression until perhaps around 10 years ago at which point his symptoms started to run together in such a way that he became completely impaired.  He has responded variously to ECT.  4 years ago we were doing ECT here in the hospital with no benefit.  He then went [facility] he [facility] reportedly with some benefit.  His recent stay in [place] he reportedly did benefit from ECT which was where we were when he was referred for maintenance treatment.  In my experience he tends to have mixed symptoms with agitation and depression although there have been times when he is much more energetic and manic like.  He has been aggressive and threatening to others in the past when psychotic.  Also when psychotic he becomes unable to care for himself.  Risk to Self:   Risk to Others:   Prior Inpatient Therapy:   Prior Outpatient Therapy:    Past Medical History:  Past Medical History:  Diagnosis Date  . Bilateral lower extremity edema: chronic with venous stasis changes 06/02/2014  . Bipolar 1 disorder (HCC)   . Bipolar disorder (HCC)   . Chronic kidney disease   .  Gout   . Hx of blood clots   . Hypertension   . Mood swings   . OSA on CPAP 06/02/2014  . Other and unspecified hyperlipidemia 06/02/2014  . Type II or unspecified type diabetes mellitus with unspecified complication, uncontrolled 06/02/2014   No past surgical history on file. Family History:  Family History  Problem Relation Age of Onset  . Dementia Mother   . Arthritis Father   . Hypertension Father   . Mental illness Sister   . Diabetes Sister   . Heart failure Neg Hx   . Kidney failure Neg  Hx   . Cancer Neg Hx    Family Psychiatric  History: None known Social History:  Social History   Substance and Sexual Activity  Alcohol Use No     Social History   Substance and Sexual Activity  Drug Use No    Social History   Socioeconomic History  . Marital status: Divorced    Spouse name: Not on file  . Number of children: Not on file  . Years of education: Not on file  . Highest education level: Not on file  Occupational History  . Not on file  Tobacco Use  . Smoking status: Never Smoker  . Smokeless tobacco: Never Used  Substance and Sexual Activity  . Alcohol use: No  . Drug use: No  . Sexual activity: Not Currently  Other Topics Concern  . Not on file  Social History Narrative   *The patient has been married for over 30 years and is currently on disability. He lives with his wife in the [place] area and has one daughter.             Social Determinants of Health   Financial Resource Strain:   . Difficulty of Paying Living Expenses: Not on file  Food Insecurity:   . Worried About Programme researcher, broadcasting/film/video in the Last Year: Not on file  . Ran Out of Food in the Last Year: Not on file  Transportation Needs:   . Lack of Transportation (Medical): Not on file  . Lack of Transportation (Non-Medical): Not on file  Physical Activity:   . Days of Exercise per Week: Not on file  . Minutes of Exercise per Session: Not on file  Stress:   . Feeling of Stress : Not on file  Social Connections:   . Frequency of Communication with Friends and Family: Not on file  . Frequency of Social Gatherings with Friends and Family: Not on file  . Attends Religious Services: Not on file  . Active Member of Clubs or Organizations: Not on file  . Attends Banker Meetings: Not on file  . Marital Status: Not on file   Additional Social History:    Allergies:   Allergies  Allergen Reactions  . Zyprexa [Olanzapine] Other (See Comments)    Allergy noted by ACT Team. No  reaction specified (??)    Labs:  Results for orders placed or performed during the hospital encounter of 01/30/20 (from the past 48 hour(s))  Comprehensive metabolic panel     Status: Abnormal   Collection Time: 01/30/20  9:25 AM  Result Value Ref Range   Sodium 138 135 - 145 mmol/L   Potassium 3.4 (L) 3.5 - 5.1 mmol/L   Chloride 101 98 - 111 mmol/L   CO2 24 22 - 32 mmol/L   Glucose, Bld 227 (H) 70 - 99 mg/dL    Comment: Glucose reference range applies  only to samples taken after fasting for at least 8 hours.   BUN 13 8 - 23 mg/dL   Creatinine, Ser 1.13 0.61 - 1.24 mg/dL   Calcium 9.0 8.9 - 10.3 mg/dL   Total Protein 7.2 6.5 - 8.1 g/dL   Albumin 3.6 3.5 - 5.0 g/dL   AST 16 15 - 41 U/L   ALT 17 0 - 44 U/L   Alkaline Phosphatase 101 38 - 126 U/L   Total Bilirubin 0.6 0.3 - 1.2 mg/dL   GFR calc non Af Amer >60 >60 mL/min   GFR calc Af Amer >60 >60 mL/min   Anion gap 13 5 - 15    Comment: Performed at [facility], [address] [place], [place] [address]  Ethanol     Status: None   Collection Time: 01/30/20  9:25 AM  Result Value Ref Range   Alcohol, Ethyl (B) <10 <10 mg/dL    Comment: (NOTE) Lowest detectable limit for serum alcohol is 10 mg/dL. For medical purposes only. Performed at [facility], [address] [place], [place] [address]   Salicylate level     Status: Abnormal   Collection Time: 01/30/20  9:25 AM  Result Value Ref Range   Salicylate Lvl <9.7 (L) 7.0 - 30.0 mg/dL    Comment: Performed at [facility], [address] [place], [place] [address]  Acetaminophen level     Status: Abnormal   Collection Time: 01/30/20  9:25 AM  Result Value Ref Range   Acetaminophen (Tylenol), Serum <10 (L) 10 - 30 ug/mL    Comment: (NOTE) Therapeutic concentrations vary significantly. A range of 10-30 ug/mL  may be an effective concentration for many patients. However, some  are best treated at concentrations outside of this  range. Acetaminophen concentrations >150 ug/mL at 4 hours after ingestion  and >50 ug/mL at 12 hours after ingestion are often associated with  toxic reactions. Performed at [facility], [address] [place], [place] [address]   cbc     Status: None   Collection Time: 01/30/20  9:25 AM  Result Value Ref Range   WBC 4.0 4.0 - 10.5 K/uL   RBC 5.07 4.22 - 5.81 MIL/uL   Hemoglobin 13.3 13.0 - 17.0 g/dL   HCT 42.1 39.0 - 52.0 %   MCV 83.0 80.0 - 100.0 fL   MCH 26.2 26.0 - 34.0 pg   MCHC 31.6 30.0 - 36.0 g/dL   RDW 13.3 11.5 - 15.5 %   Platelets 210 150 - 400 K/uL   nRBC 0.0 0.0 - 0.2 %    Comment: Performed at [facility], [address] [place], [place] [address]    No current facility-administered medications for this encounter.   Current Outpatient Medications  Medication Sig Dispense Refill  . acetaminophen (TYLENOL) 325 MG tablet Take 2 tablets (650 mg total) by mouth every 6 (six) hours as needed for mild pain (or Fever >/= 101).    . ARIPiprazole (ABILIFY) 10 MG tablet Take 1 tablet (10 mg total) by mouth daily. 30 tablet 0  . ARIPiprazole (ABILIFY) 5 MG tablet Take 5 mg by mouth daily.    Marland Kitchen atorvastatin (LIPITOR) 10 MG tablet Take 10 mg by mouth every evening.     . clonazePAM (KLONOPIN) 0.5 MG tablet Take 0.5 mg by mouth 2 (two) times daily.    . divalproex (DEPAKOTE) 125 MG DR tablet Take 125 mg by mouth 2 (two) times daily.    Marland Kitchen  divalproex (DEPAKOTE) 250 MG DR tablet Take 1 tablet (250 mg total) by mouth 2 (two) times daily. 60 tablet 0  . docusate sodium (COLACE) 100 MG capsule Take 1 capsule (100 mg total) by mouth 2 (two) times daily. 10 capsule 0  . furosemide (LASIX) 20 MG tablet Take 1 tablet (20 mg total) by mouth daily. 30 tablet 0  . glimepiride (AMARYL) 2 MG tablet Take 2 mg by mouth daily with breakfast.    . insulin aspart (NOVOLOG) 100 UNIT/ML injection Inject 0-9 Units into the skin 3 (three) times daily with meals. 10 mL 11  .  Insulin Glargine (LANTUS SOLOSTAR) 100 UNIT/ML Solostar Pen Inject 34 Units into the skin daily with breakfast.    . liothyronine (CYTOMEL) 25 MCG tablet Take 25 mcg by mouth daily before breakfast.     . lisinopril (ZESTRIL) 20 MG tablet Take 20 mg by mouth daily with breakfast.    . LORazepam (ATIVAN) 0.5 MG tablet Take 1 tablet (0.5 mg total) by mouth 3 (three) times daily. 30 tablet 0  . metFORMIN (GLUCOPHAGE-XR) 500 MG 24 hr tablet Take 1,000 mg by mouth daily with breakfast.     . metoprolol succinate (TOPROL-XL) 100 MG 24 hr tablet Take 100 mg by mouth daily. Take with or immediately following a meal.     . modafinil (PROVIGIL) 100 MG tablet Take 1 tablet (100 mg total) by mouth daily. 30 tablet 1  . rivaroxaban (XARELTO) 20 MG TABS tablet Take 1 tablet (20 mg total) by mouth daily with supper. 30 tablet 1    Musculoskeletal: Strength & Muscle Tone: within normal limits Gait & Station: normal Patient leans: N/A  Psychiatric Specialty Exam: Physical Exam  Nursing note and vitals reviewed. Constitutional: He appears well-developed and well-nourished.  HENT:  Head: Normocephalic and atraumatic.  Eyes: Pupils are equal, round, and reactive to light. Conjunctivae are normal.  Cardiovascular: Regular rhythm and normal heart sounds.  Respiratory: Effort normal. No respiratory distress.  GI: Soft.  Musculoskeletal:        General: Normal range of motion.     Cervical back: Normal range of motion.  Neurological: He is alert.  Skin: Skin is warm and dry.  Psychiatric: His affect is blunt and inappropriate. His speech is delayed. He is not agitated, not aggressive and not combative. Thought content is delusional. Cognition and memory are impaired. He expresses inappropriate judgment. He expresses no homicidal and no suicidal ideation. He is noncommunicative.    Review of Systems  Constitutional: Negative.   HENT: Negative.   Eyes: Negative.   Respiratory: Negative.   Cardiovascular:  Negative.   Gastrointestinal: Negative.   Musculoskeletal: Negative.   Skin: Negative.   Neurological: Negative.   Psychiatric/Behavioral: Positive for confusion. The patient is nervous/anxious.     Blood pressure (!) 176/115, pulse (!) 112, temperature 98.5 F (36.9 C), temperature source Oral, resp. rate 18, height (P) 6' (1.829 m), weight (P) 118.4 kg, SpO2 99 %.Body mass index is 35.4 kg/m (pended).  General Appearance: Casual  Eye Contact:  Minimal  Speech:  Garbled and Slow  Volume:  Decreased  Mood:  Dysphoric  Affect:  Constricted  Thought Process:  Disorganized  Orientation:  Negative  Thought Content:  Illogical, Rumination and Tangential  Suicidal Thoughts:  No  Homicidal Thoughts:  No  Memory:  Immediate;   Fair Recent;   Poor Remote;   Poor  Judgement:  Poor  Insight:  Shallow  Psychomotor Activity:  Decreased  Concentration:  Concentration: Poor  Recall:  Poor  Fund of Knowledge:  Fair  Language:  Fair  Akathisia:  No  Handed:  Right  AIMS (if indicated):     Assets:  Desire for Improvement Housing Resilience Social Support  ADL's:  Impaired  Cognition:  Impaired,  Mild  Sleep:        Treatment Plan Summary: Daily contact with patient to assess and evaluate symptoms and progress in treatment, Medication management and Plan 69 year old man with chronic severe illness appears to be decompensating to the point where he is becoming unmanageable in his living environment.  It had been thought that ECT would be crucial to his stability but he is now at the point where he is physically refusing treatment in a way that makes it very difficult to imagine doing treatment even though he has a guardian.  I asked him whether he would agree to ECT and cooperate if we recommended it for Friday and he told me he did not think so.  I spoke with his wife and daughter.  This is a complex situation and I think for safety it would be best to admit him to the unit downstairs and  see if he can be restabilized for the moment with medication which would perhaps allow [place] to make more of a decision about whether he will be able to return to his independent living or will need a longer term hospitalization.  Case reviewed with ER doctor and ER psychiatrist.  Disposition: Recommend psychiatric Inpatient admission when medically cleared.  [name], MD 01/30/2020 11:44 AM

## 2020-01-30 NOTE — [facility] Group Notes (Signed)
[facility] Group Notes:  (Nursing/MHT/Case Management/Adjunct)  Date:  01/30/2020  Time:  9:26 PM  Type of Therapy:  Group Therapy  Participation Level:  Did Not Attend    Landry Mellow 01/30/2020, 9:26 PM

## 2020-01-31 DIAGNOSIS — F315 Bipolar disorder, current episode depressed, severe, with psychotic features: Principal | ICD-10-CM

## 2020-01-31 LAB — GLUCOSE, CAPILLARY
Glucose-Capillary: 138 mg/dL — ABNORMAL HIGH (ref 70–99)
Glucose-Capillary: 156 mg/dL — ABNORMAL HIGH (ref 70–99)
Glucose-Capillary: 73 mg/dL (ref 70–99)
Glucose-Capillary: 91 mg/dL (ref 70–99)

## 2020-01-31 MED ORDER — AMLODIPINE BESYLATE 5 MG PO TABS
5.0000 mg | ORAL_TABLET | Freq: Every day | ORAL | Status: DC
  Administered 2020-01-31 – 2020-02-01 (×2): 5 mg via ORAL
  Filled 2020-01-31 (×2): qty 1

## 2020-01-31 MED ORDER — LISINOPRIL 20 MG PO TABS
40.0000 mg | ORAL_TABLET | Freq: Every day | ORAL | Status: DC
  Administered 2020-02-01 – 2020-02-05 (×5): 40 mg via ORAL
  Filled 2020-01-31 (×5): qty 2

## 2020-01-31 MED ORDER — ARIPIPRAZOLE 10 MG PO TABS
20.0000 mg | ORAL_TABLET | Freq: Every day | ORAL | Status: DC
  Administered 2020-02-01 – 2020-02-05 (×5): 20 mg via ORAL
  Filled 2020-01-31 (×5): qty 2

## 2020-01-31 NOTE — [facility] Counselor (Signed)
Adult Comprehensive Assessment  Patient ID: Christian [name], male   DOB: 24-Dec-1950, 69 y.o.   MRN: [id number]  Information Source: Information source: Patient  Current Stressors:  Patient states their primary concerns and needs for treatment are:: "I wasnt taking my medication and I have bipolar disorder" Patient states their goals for this hospitilization and ongoing recovery are:: "Get everything on even keel" Educational / Learning stressors: None reported Employment / Job issues: Pt reports receiving disability Family Relationships: No stressors reported Surveyor, quantity / Lack of resources (include bankruptcy): Limited income Housing / Lack of housing: Stable housing Physical health (include injuries & life threatening diseases): Pt reports hypertension Social relationships: Pt reports he is isolated in his home Substance abuse: Pt denies Bereavement / Loss: Parents deceased, father passed away in 04/09/19  Living/Environment/Situation:  Living Arrangements: Alone Living conditions (as described by patient or guardian): "Its isolated" Who else lives in the home?: Lives alone at [facility] How long has patient lived in current situation?: 1 yr  Family History:  Marital status: Divorced Divorced, when?: "Couple of years ago" What types of issues is patient dealing with in the relationship?: None reported Does patient have children?: Yes How many children?: 1 How is patient's relationship with their children?: Pt reports 1 daughter who he says he has a good relationship with  Childhood History:  By whom was/is the patient raised?: Both parents Description of patient's relationship with caregiver when they were a child: "Good but we were poor" Patient's description of current relationship with people who raised him/her: Parents are deceased Does patient have siblings?: Yes Number of Siblings: 7 Description of patient's current relationship with siblings: Pt reports 4 brothers, 3  sisters and states having a good relationship with them Did patient suffer any verbal/emotional/physical/sexual abuse as a child?: No Did patient suffer from severe childhood neglect?: No Has patient ever been sexually abused/assaulted/raped as an adolescent or adult?: No Was the patient ever a victim of a crime or a disaster?: No Witnessed domestic violence?: No Has patient been effected by domestic violence as an adult?: No  Education:  Highest grade of school patient has completed: Pt reports receiving bachelor degree Currently a student?: No Learning disability?: No  Employment/Work Situation:   Employment situation: On disability Why is patient on disability: Mental health How long has patient been on disability: "A long time" What is the longest time patient has a held a job?: 20+ years 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 Did You Receive Any Psychiatric Treatment/Services While in Equities trader?: No Are There Guns or Other Weapons in Your Home?: No Are These Comptroller?: (Pt denies access)  Financial Resources:   Financial resources: [employer], Receives SSI Does patient have a Lawyer or guardian?: Yes Name of representative payee or guardian: Christian [name], daughter 54 [phone]  Alcohol/Substance Abuse:   What has been your use of drugs/alcohol within the last 12 months?: Pt denies If attempted suicide, did drugs/alcohol play a role in this?: No Alcohol/Substance Abuse Treatment Hx: Denies past history Has alcohol/substance abuse ever caused legal problems?: No  Social Support System:   Patient's Community Support System: Fair Development worker, community Support System: "My two older sisters" Type of faith/religion: Warehouse manager" How does patient's faith help to cope with current illness?: "I listen to [employer]"  Leisure/Recreation:   Leisure and Hobbies: Watching TV, singing  Strengths/Needs:   What is the patient's  perception of their strengths?: "I get along with people"  Patient states they can use these personal strengths during their treatment to contribute to their recovery: "If I have a lapse, go back to the basics"  Discharge Plan:   Currently receiving community mental health services: Yes (From Whom)([facility]) Patient states concerns and preferences for aftercare planning are: Pt reports he would like to resume treatment with current provider Patient states they will know when they are safe and ready for discharge when: "I'll know when its time" Does patient have access to transportation?: No Does patient have financial barriers related to discharge medications?: No Plan for no access to transportation at discharge: CSW will assist with transportation Will patient be returning to same living situation after discharge?: Yes(Lives alone)  Summary/Recommendations:   Summary and Recommendations (to be completed by the evaluator): Pt is a 69 yr old male who was brought to the ED by his family after he refused to attend his ECT appointment. Pt reports having a history of bipolar disorder and admits to not taking his medication routinely. Pt reports being followed by [facility] and says he would like to resume treatment with current provider. Pt denies any substance use. Pt denies any history of trauma and abuse. Pt has a legal guardian, Christian [name], daughter [phone]. While here, patient will benefit from crisis stabilization, medication evaluation, group therapy and psychoeducation. In addition, it is recommended that patient remain compliant with the established discharge plan and continue treatment.  Avanell Banwart Lynelle Smoke. 01/31/2020

## 2020-01-31 NOTE — Progress Notes (Signed)
BRIEF PHARMACY NOTE   This patient attended and participated in Medication Management Group counseling led by [facility] staff pharmacist.  This interactive class reviews basic information about prescription medications and education on personal responsibility in medication management.  The class also includes general knowledge of 3 main classes of behavioral medications, including antipsychotics, antidepressants, and mood stabilizers.     Patient behavior was appropriate for group setting.    Educational materials sourced from:  "Medication Do's and Don'ts" from [email or web address]   "Mental Health Medications" from [facility] of Mental Health FaxRack.tn.shtml#part [email or web address]     Albina Billet, PharmD, BCPS Clinical Pharmacist 01/31/2020 2:52 PM

## 2020-01-31 NOTE — Progress Notes (Signed)
Patient alert and oriented x 4, affect is blunted, thoughts are organized and coherent, he was noted isolated to his room except during snack time. Patient rated his depression a 7/10  ( 0 low- 10 high) he expressed in  brief interaction with him his stressors are his health issues. Patient is hoping ECT will help him with his depression. Patient was offered support and encouraged to attend evening wrap up group. Patient didn't attend evening group, he was complaint with medication regimen.15 minutes safety checks maintained will continue to monitor.

## 2020-01-31 NOTE — [facility] Group Notes (Signed)
[facility] Group Notes:  (Nursing/MHT/Case Management/Adjunct)  Date:  01/31/2020  Time: 8:30AM  Type of Therapy:  Community Meeting   Participation Level:  Minimal  Participation Quality:  Appropriate and Attentive  Affect:  Appropriate  Cognitive:  Alert and Appropriate  Insight:  Appropriate and Good  Engagement in Group:  Engaged and Supportive  Modes of Intervention:  Discussion and Orientation  Summary of Progress/Problems:  Christian [name] 01/31/2020, 9:59 AM

## 2020-01-31 NOTE — [facility] Suicide Risk Assessment (Signed)
[facility] Admission Suicide Risk Assessment   Nursing information obtained from:  Patient Demographic factors:  Age 69 or older, Divorced or widowed, Living alone, Unemployed Current Mental Status:  NA Loss Factors:  NA Historical Factors:  NA Risk Reduction Factors:  NA  Total Time spent with patient: 1 hour Principal Problem: Bipolar affective disorder, depressed, severe, with psychotic behavior (HCC) Diagnosis:  Principal Problem:   Bipolar affective disorder, depressed, severe, with psychotic behavior (HCC) Active Problems:   Hypertension   Type 2 diabetes mellitus with complication, without long-term current use of insulin (HCC)  Subjective Data: Patient seen chart reviewed.  69 year old man with bipolar disorder currently with depression with psychotic features.  Patient denies suicidal ideation.  Currently denies hallucinations.  Not showing any behavior or acting out to harm himself but admits to feeling very withdrawn and flat and having a lot of trouble taking care of himself.  Currently cooperative with medication treatment although resistant to ECT.  Continued Clinical Symptoms:  Alcohol Use Disorder Identification Test Final Score (AUDIT): 0 The "Alcohol Use Disorders Identification Test", Guidelines for Use in Primary Care, Second Edition.  World Science writer [facility]). Score between 0-7:  no or low risk or alcohol related problems. Score between 8-15:  moderate risk of alcohol related problems. Score between 16-19:  high risk of alcohol related problems. Score 20 or above:  warrants further diagnostic evaluation for alcohol dependence and treatment.   CLINICAL FACTORS:   Depression:   Anhedonia   Musculoskeletal: Strength & Muscle Tone: within normal limits Gait & Station: normal Patient leans: Backward  Psychiatric Specialty Exam: Physical Exam  Nursing note and vitals reviewed. Constitutional: He appears well-developed and well-nourished.  HENT:  Head:  Normocephalic and atraumatic.  Eyes: Pupils are equal, round, and reactive to light. Conjunctivae are normal.  Cardiovascular: Regular rhythm and normal heart sounds.  Respiratory: Effort normal. No respiratory distress.  GI: Soft.  Musculoskeletal:        General: Normal range of motion.     Cervical back: Normal range of motion.  Neurological: He is alert.  Skin: Skin is warm and dry.  Psychiatric: His affect is blunt. His speech is delayed. He is slowed and withdrawn. Thought content is paranoid. Cognition and memory are impaired. He expresses inappropriate judgment. He expresses no homicidal and no suicidal ideation.    Review of Systems  Constitutional: Positive for fatigue.  HENT: Negative.   Eyes: Negative.   Respiratory: Negative.   Cardiovascular: Negative.   Gastrointestinal: Negative.   Musculoskeletal: Negative.   Skin: Negative.   Neurological: Negative.   Psychiatric/Behavioral: Positive for agitation, decreased concentration and dysphoric mood. Negative for behavioral problems and confusion. The patient is nervous/anxious.     Blood pressure (!) 165/113, pulse 99, temperature 98.7 F (37.1 C), temperature source Oral, resp. rate 17, height 6' (1.829 m), weight 117.5 kg, SpO2 100 %.Body mass index is 35.13 kg/m.  General Appearance: Casual  Eye Contact:  Fair  Speech:  Slow  Volume:  Decreased  Mood:  Depressed and Dysphoric  Affect:  Constricted and Depressed  Thought Process:  Disorganized  Orientation:  Full (Time, Place, and Person)  Thought Content:  Illogical, Paranoid Ideation, Rumination and Tangential  Suicidal Thoughts:  No  Homicidal Thoughts:  No  Memory:  Immediate;   Fair Recent;   Fair Remote;   Poor  Judgement:  Fair  Insight:  Shallow  Psychomotor Activity:  Decreased  Concentration:  Concentration: Poor  Recall:  [employer]  of Knowledge:  Fair  Language:  Fair  Akathisia:  No  Handed:  Right  AIMS (if indicated):     Assets:  Desire  for Improvement Financial Resources/Insurance Housing Resilience Social Support  ADL's:  Impaired  Cognition:  Impaired,  Mild  Sleep:  Number of Hours: 8.15      COGNITIVE FEATURES THAT CONTRIBUTE TO RISK:  Loss of executive function    SUICIDE RISK:   Minimal: No identifiable suicidal ideation.  Patients presenting with no risk factors but with morbid ruminations; may be classified as minimal risk based on the severity of the depressive symptoms  PLAN OF CARE: Discussed overall plan with patient.  Discussed ECT.  Continue 15-minute checks.  Adjust medicines as needed.  Hope for likely discharge within a couple days if he seems to be stabilizing  I certify that inpatient services furnished can reasonably be expected to improve the patient's condition.   Alethia Berthold, MD 01/31/2020, 2:25 PM

## 2020-01-31 NOTE — H&P (Signed)
Psychiatric Admission Assessment Adult  Patient Identification: Christian [name] MRN:  [id number] Date of Evaluation:  01/31/2020 Chief Complaint:  Bipolar affective disorder, depressed, severe, with psychotic behavior (HCC) [F31.5] Principal Diagnosis: Bipolar affective disorder, depressed, severe, with psychotic behavior (HCC) Diagnosis:  Principal Problem:   Bipolar affective disorder, depressed, severe, with psychotic behavior (HCC) Active Problems:   Hypertension   Type 2 diabetes mellitus with complication, without long-term current use of insulin (HCC)  History of Present Illness: Patient seen and chart reviewed.  Patient well-known from previous encounters.  69 year old man with bipolar disorder.  He was receiving maintenance ECT from our service but yesterday his behavior became so agitated and hostile that it was impossible to proceed with ECT.  Family reports that for the past few weeks they have seen a decline in his functioning.  He is less active during the day.  Mood seems more confused and he frequently is saying more paranoid and confused things.  He has trouble keeping track of his medicine and is not feeding himself well.  Patient had previously had a reported good response to ECT but became physically agitated to the point of violence yesterday.  On interview today he says he does not like the ECT.  He does not like the way it makes him feel when he is getting the anesthetic and does not like the way he feels when he wakes up.  He is ambivalent as to whether he thinks it was ever helpful.  Patient is currently denying suicidal or homicidal ideation.  Denies current hallucinations although frequently seems to be easily restrict distracted and responding to internal stimuli.  Patient admits his mood has been bad and that things are getting worse for him at home.  His blood pressure has been poorly controlled from what I can tell. Associated Signs/Symptoms: Depression Symptoms:   psychomotor retardation, fatigue, difficulty concentrating, hopelessness, recurrent thoughts of death, loss of energy/fatigue, disturbed sleep, (Hypo) Manic Symptoms:  none Anxiety Symptoms:  Excessive Worry, Psychotic Symptoms:  Ideas of Reference, Paranoia, PTSD Symptoms: Negative Total Time spent with patient: 1 hour  Past Psychiatric History: Patient has a history of bipolar disorder which has progressively gotten worse particularly over the past several years.  He is now at a point where he is almost constantly symptomatic either depressed or manic.  Additionally he seems to be having more cognitive impairment at baseline.  Previously ECT had been of some help although there were also incidents recently when it had seem to have lost its effectiveness.  He has had multiple lengthy hospitalizations in his life and was just in [facility] not long ago.  No actual history of suicide attempts I know of.  Is the patient at risk to self? Yes.    Has the patient been a risk to self in the past 6 months? Yes.    Has the patient been a risk to self within the distant past? Yes.    Is the patient a risk to others? Yes.    Has the patient been a risk to others in the past 6 months? No.  Has the patient been a risk to others within the distant past? Yes.     Prior Inpatient Therapy:   Prior Outpatient Therapy:    Alcohol Screening: Patient refused Alcohol Screening Tool: Yes 1. How often do you have a drink containing alcohol?: Never 2. How many drinks containing alcohol do you have on a typical day when you are drinking?: 1 or  2 3. How often do you have six or more drinks on one occasion?: Never AUDIT-C Score: 0 4. How often during the last year have you found that you were not able to stop drinking once you had started?: Never 5. How often during the last year have you failed to do what was normally expected from you becasue of drinking?: Never 6. How often during the last year have  you needed a first drink in the morning to get yourself going after a heavy drinking session?: Never 7. How often during the last year have you had a feeling of guilt of remorse after drinking?: Never 8. How often during the last year have you been unable to remember what happened the night before because you had been drinking?: Never 9. Have you or someone else been injured as a result of your drinking?: No 10. Has a relative or friend or a doctor or another health worker been concerned about your drinking or suggested you cut down?: No Alcohol Use Disorder Identification Test Final Score (AUDIT): 0 Alcohol Brief Interventions/Follow-up: AUDIT Score <7 follow-up not indicated Substance Abuse History in the last 12 months:  No. Consequences of Substance Abuse: Negative Previous Psychotropic Medications: Yes  Psychological Evaluations: Yes  Past Medical History:  Past Medical History:  Diagnosis Date  . Bilateral lower extremity edema: chronic with venous stasis changes 06/02/2014  . Bipolar 1 disorder (HCC)   . Bipolar disorder (HCC)   . Chronic kidney disease   . Gout   . Hx of blood clots   . Hypertension   . Mood swings   . OSA on CPAP 06/02/2014  . Other and unspecified hyperlipidemia 06/02/2014  . Type II or unspecified type diabetes mellitus with unspecified complication, uncontrolled 06/02/2014   History reviewed. No pertinent surgical history. Family History:  Family History  Problem Relation Age of Onset  . Dementia Mother   . Arthritis Father   . Hypertension Father   . Mental illness Sister   . Diabetes Sister   . Heart failure Neg Hx   . Kidney failure Neg Hx   . Cancer Neg Hx    Family Psychiatric  History: Not reported Tobacco Screening: Have you used any form of tobacco in the last 30 days? (Cigarettes, Smokeless Tobacco, Cigars, and/or Pipes): No Social History:  Social History   Substance and Sexual Activity  Alcohol Use No     Social History   Substance and  Sexual Activity  Drug Use No    Additional Social History: Marital status: Divorced Divorced, when?: "Couple of years ago" What types of issues is patient dealing with in the relationship?: None reported Does patient have children?: Yes How many children?: 1 How is patient's relationship with their children?: Pt reports 1 daughter who he says he has a good relationship with                         Allergies:   Allergies  Allergen Reactions  . Zyprexa [Olanzapine] Other (See Comments)    Allergy noted by ACT Team. No reaction specified (??)   Lab Results:  Results for orders placed or performed during the hospital encounter of 01/30/20 (from the past 48 hour(s))  Glucose, capillary     Status: Abnormal   Collection Time: 01/31/20  7:04 AM  Result Value Ref Range   Glucose-Capillary 138 (H) 70 - 99 mg/dL    Comment: Glucose reference range applies only to samples taken  after fasting for at least 8 hours.  Glucose, capillary     Status: Abnormal   Collection Time: 01/31/20 11:19 AM  Result Value Ref Range   Glucose-Capillary 156 (H) 70 - 99 mg/dL    Comment: Glucose reference range applies only to samples taken after fasting for at least 8 hours.   Comment 1 Notify RN     Blood Alcohol level:  Lab Results  Component Value Date   ETH <10 01/30/2020   ETH <10 10/31/2019    Metabolic Disorder Labs:  Lab Results  Component Value Date   HGBA1C 7.1 (H) 01/30/2020   MPG 157.07 01/30/2020   MPG 186 (H) 07/02/2014   No results found for: PROLACTIN Lab Results  Component Value Date   CHOL 108 12/28/2015   TRIG 62 12/28/2015   HDL 33 (L) 12/28/2015   CHOLHDL 3.3 12/28/2015   VLDL 12 12/28/2015   LDLCALC 63 12/28/2015   LDLCALC  03/08/2011    53        Total Cholesterol/HDL:CHD Risk Coronary Heart Disease Risk Table                     Men   Women  1/2 Average Risk   3.4   3.3  Average Risk       5.0   4.4  2 X Average Risk   9.6   7.1  3 X Average Risk   23.4   11.0        Use the calculated Patient Ratio above and the CHD Risk Table to determine the patient's CHD Risk.        ATP III CLASSIFICATION (LDL):  <100     mg/dL   Optimal  007-622  mg/dL   Near or Above                    Optimal  130-159  mg/dL   Borderline  633-354  mg/dL   High  >562     mg/dL   Very High    Current Medications: Current Facility-Administered Medications  Medication Dose Route Frequency Provider Last Rate Last Admin  . acetaminophen (TYLENOL) tablet 650 mg  650 mg Oral Q6H PRN Whitfield Dulay T, MD      . alum & mag hydroxide-simeth (MAALOX/MYLANTA) 200-200-20 MG/5ML suspension 30 mL  30 mL Oral Q4H PRN Myldred Raju T, MD      . amLODipine (NORVASC) tablet 5 mg  5 mg Oral Daily Araina Butrick T, MD      . Melene Muller ON 02/01/2020] ARIPiprazole (ABILIFY) tablet 20 mg  20 mg Oral Daily Coady Train T, MD      . aspirin tablet 325 mg  325 mg Oral Daily Reha Martinovich, Jackquline Denmark, MD   325 mg at 01/31/20 0736  . atorvastatin (LIPITOR) tablet 10 mg  10 mg Oral q1800 Temeka Pore T, MD      . clonazePAM Scarlette Calico) tablet 0.5 mg  0.5 mg Oral BID Leimomi Zervas, Jackquline Denmark, MD   0.5 mg at 01/31/20 0736  . divalproex (DEPAKOTE) DR tablet 375 mg  375 mg Oral Q12H Tavaras Goody, Jackquline Denmark, MD   375 mg at 01/31/20 0754  . furosemide (LASIX) tablet 20 mg  20 mg Oral Daily Quinita Kostelecky, Jackquline Denmark, MD   20 mg at 01/31/20 0738  . glimepiride (AMARYL) tablet 2 mg  2 mg Oral Q breakfast Salayah Meares, Jackquline Denmark, MD   2 mg at 01/31/20 0737  . insulin aspart (  novoLOG) injection 0-15 Units  0-15 Units Subcutaneous TID WC Stoy Fenn, Madie Reno, MD   3 Units at 01/31/20 1133  . insulin glargine (LANTUS) injection 34 Units  34 Units Subcutaneous Daily Libbey Duce, Madie Reno, MD   34 Units at 01/31/20 0741  . liothyronine (CYTOMEL) tablet 25 mcg  25 mcg Oral Daily Saleh Ulbrich, Madie Reno, MD   25 mcg at 01/31/20 0737  . [START ON 02/01/2020] lisinopril (ZESTRIL) tablet 40 mg  40 mg Oral Daily Shelvy Perazzo T, MD      . magnesium hydroxide (MILK OF  MAGNESIA) suspension 30 mL  30 mL Oral Daily PRN Elija Mccamish T, MD      . metFORMIN (GLUCOPHAGE-XR) 24 hr tablet 1,000 mg  1,000 mg Oral Q breakfast Eldo Umanzor, Madie Reno, MD   1,000 mg at 01/31/20 0737  . metoprolol succinate (TOPROL-XL) 24 hr tablet 100 mg  100 mg Oral Daily Rashad Obeid, Madie Reno, MD   100 mg at 01/31/20 3299  . rivaroxaban (XARELTO) tablet 20 mg  20 mg Oral Q supper Francessca Friis, Madie Reno, MD   20 mg at 01/30/20 2056   PTA Medications: Medications Prior to Admission  Medication Sig Dispense Refill Last Dose  . acetaminophen (TYLENOL) 325 MG tablet Take 2 tablets (650 mg total) by mouth every 6 (six) hours as needed for mild pain (or Fever >/= 101).     Marland Kitchen amLODipine (NORVASC) 5 MG tablet Take 5 mg by mouth daily.     Marland Kitchen atorvastatin (LIPITOR) 10 MG tablet Take 10 mg by mouth every evening.      . furosemide (LASIX) 20 MG tablet Take 1 tablet (20 mg total) by mouth daily. 30 tablet 0   . glimepiride (AMARYL) 2 MG tablet Take 2 mg by mouth daily with breakfast.     . liothyronine (CYTOMEL) 25 MCG tablet Take 25 mcg by mouth daily before breakfast.      . lisinopril (ZESTRIL) 20 MG tablet Take 20 mg by mouth daily with breakfast.     . metoprolol succinate (TOPROL-XL) 25 MG 24 hr tablet Take 25 mg by mouth daily.      . QUEtiapine (SEROQUEL) 25 MG tablet Take 25 mg by mouth 2 (two) times daily. 0900,1500     . QUEtiapine (SEROQUEL) 300 MG tablet Take 300 mg by mouth at bedtime.     . rivaroxaban (XARELTO) 20 MG TABS tablet Take 1 tablet (20 mg total) by mouth daily with supper. (Patient taking differently: Take 20 mg by mouth daily. ) 30 tablet 1   . sitaGLIPtin (JANUVIA) 25 MG tablet Take 25 mg by mouth daily.       Musculoskeletal: Strength & Muscle Tone: within normal limits Gait & Station: normal Patient leans: N/A  Psychiatric Specialty Exam: Physical Exam  Nursing note and vitals reviewed. Constitutional: He appears well-developed and well-nourished.  HENT:  Head: Normocephalic  and atraumatic.  Eyes: Pupils are equal, round, and reactive to light. Conjunctivae are normal.  Cardiovascular: Regular rhythm and normal heart sounds.  Respiratory: Effort normal.  GI: Soft.  Musculoskeletal:        General: Normal range of motion.     Cervical back: Normal range of motion.  Neurological: He is alert.  Skin: Skin is warm and dry.  Psychiatric: His affect is blunt. His speech is delayed. He is slowed and withdrawn. Thought content is paranoid and delusional. Cognition and memory are impaired. He expresses inappropriate judgment. He exhibits a depressed mood. He expresses no homicidal  and no suicidal ideation.    Review of Systems  Constitutional: Negative.   HENT: Negative.   Eyes: Negative.   Respiratory: Negative.   Cardiovascular: Negative.   Gastrointestinal: Negative.   Musculoskeletal: Negative.   Skin: Negative.   Neurological: Negative.   Psychiatric/Behavioral: Positive for behavioral problems, confusion, dysphoric mood and sleep disturbance. The patient is nervous/anxious.     Blood pressure (!) 165/113, pulse 99, temperature 98.7 F (37.1 C), temperature source Oral, resp. rate 17, height 6' (1.829 m), weight 117.5 kg, SpO2 100 %.Body mass index is 35.13 kg/m.  General Appearance: Casual  Eye Contact:  Minimal  Speech:  Slow  Volume:  Decreased  Mood:  Dysphoric and Hopeless  Affect:  Constricted  Thought Process:  Coherent  Orientation:  Full (Time, Place, and Person)  Thought Content:  Illogical, Paranoid Ideation, Rumination and Tangential  Suicidal Thoughts:  No  Homicidal Thoughts:  No  Memory:  Immediate;   Fair Recent;   Poor Remote;   Fair  Judgement:  Fair  Insight:  Fair  Psychomotor Activity:  Decreased  Concentration:  Concentration: Poor  Recall:  [employer] of Knowledge:  Fair  Language:  Fair  Akathisia:  No  Handed:  Right  AIMS (if indicated):     Assets:  Desire for Improvement Financial  Resources/Insurance Housing Resilience Social Support  ADL's:  Impaired  Cognition:  Impaired,  Mild  Sleep:  Number of Hours: 8.15    Treatment Plan Summary: Daily contact with patient to assess and evaluate symptoms and progress in treatment, Medication management and Plan Patient and I talked about ECT this morning.  He does not dispute that it has at times seem to treat his depression but he has strong feelings against the treatment itself because of how it makes him feel acutely.  He says he would have to think about it hard before deciding whether he would be cooperative with further ECT treatment.  Because of this maintenance ECT may ultimately not be a good form of maintenance treatment for him.  Reviewing his medicine I will be increasing his Abilify to 20 mg.  Additionally I see that he is on much less blood pressure medicine that he used to be so I will be increasing the lisinopril back up to 40 and adding a small dose of amlodipine.  We will try to keep his family informed.  Observation Level/Precautions:  15 minute checks  Laboratory:  Chemistry Profile  Psychotherapy:    Medications:    Consultations:    Discharge Concerns:    Estimated LOS:  Other:     Physician Treatment Plan for Primary Diagnosis: Bipolar affective disorder, depressed, severe, with psychotic behavior (HCC) Long Term Goal(s): Improvement in symptoms so as ready for discharge  Short Term Goals: Ability to verbalize feelings will improve and Ability to demonstrate self-control will improve  Physician Treatment Plan for Secondary Diagnosis: Principal Problem:   Bipolar affective disorder, depressed, severe, with psychotic behavior (HCC) Active Problems:   Hypertension   Type 2 diabetes mellitus with complication, without long-term current use of insulin (HCC)  Long Term Goal(s): Improvement in symptoms so as ready for discharge  Short Term Goals: Ability to maintain clinical measurements within normal  limits will improve and Compliance with prescribed medications will improve  I certify that inpatient services furnished can reasonably be expected to improve the patient's condition.    [name], MD 3/4/20212:28 PM

## 2020-01-31 NOTE — [facility] Group Notes (Signed)
LCSW Group Therapy Note  01/31/2020 2:40 PM  Type of Therapy/Topic:  Group Therapy:  Balance in Life  Participation Level:  None  Description of Group:    This group will address the concept of balance and how it feels and looks when one is unbalanced. Patients will be encouraged to process areas in their lives that are out of balance and identify reasons for remaining unbalanced. Facilitators will guide patients in utilizing problem-solving interventions to address and correct the stressor making their life unbalanced. Understanding and applying boundaries will be explored and addressed for obtaining and maintaining a balanced life. Patients will be encouraged to explore ways to assertively make their unbalanced needs known to significant others in their lives, using other group members and facilitator for support and feedback.  Therapeutic Goals: 1. Patient will identify two or more emotions or situations they have that consume much of in their lives. 2. Patient will identify signs/triggers that life has become out of balance:  3. Patient will identify two ways to set boundaries in order to achieve balance in their lives:  4. Patient will demonstrate ability to communicate their needs through discussion and/or role plays  Summary of Patient Progress: Patient attended group, however pt did not engage in discussion.    Therapeutic Modalities:   Cognitive Behavioral Therapy Solution-Focused Therapy Assertiveness Training  Penni Homans MSW, LCSW 01/31/2020 2:40 PM

## 2020-01-31 NOTE — Tx Team (Addendum)
Interdisciplinary Treatment and Diagnostic Plan Update  01/31/2020 Time of Session: 9:00AM Christian [name] MRN: [id number]  Principal Diagnosis: <principal problem not specified>  Secondary Diagnoses: Active Problems:   Bipolar affective disorder, depressed, severe, with psychotic behavior ([place])   Current Medications:  Current Facility-Administered Medications  Medication Dose Route Frequency Provider Last Rate Last Admin  . acetaminophen (TYLENOL) tablet 650 mg  650 mg Oral Q6H PRN Clapacs, John T, MD      . alum & mag hydroxide-simeth (MAALOX/MYLANTA) 200-200-20 MG/5ML suspension 30 mL  30 mL Oral Q4H PRN Clapacs, John T, MD      . ARIPiprazole (ABILIFY) tablet 10 mg  10 mg Oral Daily Clapacs, Madie Reno, MD   10 mg at 01/31/20 0736  . aspirin tablet 325 mg  325 mg Oral Daily Clapacs, Madie Reno, MD   325 mg at 01/31/20 0736  . atorvastatin (LIPITOR) tablet 10 mg  10 mg Oral q1800 Clapacs, John T, MD      . clonazePAM Bobbye [place]) tablet 0.5 mg  0.5 mg Oral BID Clapacs, Madie Reno, MD   0.5 mg at 01/31/20 0736  . divalproex (DEPAKOTE) DR tablet 375 mg  375 mg Oral Q12H Clapacs, Madie Reno, MD   375 mg at 01/31/20 0754  . furosemide (LASIX) tablet 20 mg  20 mg Oral Daily Clapacs, Madie Reno, MD   20 mg at 01/31/20 0738  . glimepiride (AMARYL) tablet 2 mg  2 mg Oral Q breakfast Clapacs, Madie Reno, MD   2 mg at 01/31/20 0737  . insulin aspart (novoLOG) injection 0-15 Units  0-15 Units Subcutaneous TID WC Clapacs, Madie Reno, MD   3 Units at 01/31/20 1133  . insulin glargine (LANTUS) injection 34 Units  34 Units Subcutaneous Daily Clapacs, Madie Reno, MD   34 Units at 01/31/20 0741  . liothyronine (CYTOMEL) tablet 25 mcg  25 mcg Oral Daily Clapacs, Madie Reno, MD   25 mcg at 01/31/20 0737  . lisinopril (ZESTRIL) tablet 20 mg  20 mg Oral Daily Clapacs, Madie Reno, MD   20 mg at 01/31/20 0647  . magnesium hydroxide (MILK OF MAGNESIA) suspension 30 mL  30 mL Oral Daily PRN Clapacs, John T, MD      . metFORMIN (GLUCOPHAGE-XR) 24 hr  tablet 1,000 mg  1,000 mg Oral Q breakfast Clapacs, Madie Reno, MD   1,000 mg at 01/31/20 0737  . metoprolol succinate (TOPROL-XL) 24 hr tablet 100 mg  100 mg Oral Daily Clapacs, Madie Reno, MD   100 mg at 01/31/20 5361  . rivaroxaban (XARELTO) tablet 20 mg  20 mg Oral Q supper Clapacs, Madie Reno, MD   20 mg at 01/30/20 2056   PTA Medications: Medications Prior to Admission  Medication Sig Dispense Refill Last Dose  . acetaminophen (TYLENOL) 325 MG tablet Take 2 tablets (650 mg total) by mouth every 6 (six) hours as needed for mild pain (or Fever >/= 101).     Marland Kitchen amLODipine (NORVASC) 5 MG tablet Take 5 mg by mouth daily.     Marland Kitchen atorvastatin (LIPITOR) 10 MG tablet Take 10 mg by mouth every evening.      . furosemide (LASIX) 20 MG tablet Take 1 tablet (20 mg total) by mouth daily. 30 tablet 0   . glimepiride (AMARYL) 2 MG tablet Take 2 mg by mouth daily with breakfast.     . liothyronine (CYTOMEL) 25 MCG tablet Take 25 mcg by mouth daily before breakfast.      . lisinopril (  ZESTRIL) 20 MG tablet Take 20 mg by mouth daily with breakfast.     . metoprolol succinate (TOPROL-XL) 25 MG 24 hr tablet Take 25 mg by mouth daily.      . QUEtiapine (SEROQUEL) 25 MG tablet Take 25 mg by mouth 2 (two) times daily. 0900,1500     . QUEtiapine (SEROQUEL) 300 MG tablet Take 300 mg by mouth at bedtime.     . rivaroxaban (XARELTO) 20 MG TABS tablet Take 1 tablet (20 mg total) by mouth daily with supper. (Patient taking differently: Take 20 mg by mouth daily. ) 30 tablet 1   . sitaGLIPtin (JANUVIA) 25 MG tablet Take 25 mg by mouth daily.       Patient Stressors: Health problems Marital or family conflict Medication change or noncompliance  Patient Strengths: Capable of independent living Supportive family/friends  Treatment Modalities: Medication Management, Group therapy, Case management,  1 to 1 session with clinician, Psychoeducation, Recreational therapy.   Physician Treatment Plan for Primary Diagnosis: <principal  problem not specified> Long Term Goal(s):     Short Term Goals:    Medication Management: Evaluate patient's response, side effects, and tolerance of medication regimen.  Therapeutic Interventions: 1 to 1 sessions, Unit Group sessions and Medication administration.  Evaluation of Outcomes: Not Met  Physician Treatment Plan for Secondary Diagnosis: Active Problems:   Bipolar affective disorder, depressed, severe, with psychotic behavior ([place])  Long Term Goal(s):     Short Term Goals:       Medication Management: Evaluate patient's response, side effects, and tolerance of medication regimen.  Therapeutic Interventions: 1 to 1 sessions, Unit Group sessions and Medication administration.  Evaluation of Outcomes: Not Met   RN Treatment Plan for Primary Diagnosis: <principal problem not specified> Long Term Goal(s): Knowledge of disease and therapeutic regimen to maintain health will improve  Short Term Goals: Ability to verbalize frustration and anger appropriately will improve, Ability to demonstrate self-control, Ability to participate in decision making will improve, Ability to verbalize feelings will improve, Ability to identify and develop effective coping behaviors will improve and Compliance with prescribed medications will improve  Medication Management: RN will administer medications as ordered by provider, will assess and evaluate patient's response and provide education to patient for prescribed medication. RN will report any adverse and/or side effects to prescribing provider.  Therapeutic Interventions: 1 on 1 counseling sessions, Psychoeducation, Medication administration, Evaluate responses to treatment, Monitor vital signs and CBGs as ordered, Perform/monitor CIWA, COWS, AIMS and Fall Risk screenings as ordered, Perform wound care treatments as ordered.  Evaluation of Outcomes: Not Met   LCSW Treatment Plan for Primary Diagnosis: <principal problem not specified> Long  Term Goal(s): Safe transition to appropriate next level of care at discharge, Engage patient in therapeutic group addressing interpersonal concerns.  Short Term Goals: Engage patient in aftercare planning with referrals and resources, Increase social support, Increase ability to appropriately verbalize feelings, Increase emotional regulation, Facilitate acceptance of mental health diagnosis and concerns and Increase skills for wellness and recovery  Therapeutic Interventions: Assess for all discharge needs, 1 to 1 time with Social worker, Explore available resources and support systems, Assess for adequacy in community support network, Educate family and significant other(s) on suicide prevention, Complete Psychosocial Assessment, Interpersonal group therapy.  Evaluation of Outcomes: Not Met   Progress in Treatment: Attending groups: No. Participating in groups: No. Taking medication as prescribed: Yes. Toleration medication: Yes. Family/Significant other contact made: No, will contact:  once permission is given.  Patient understands  diagnosis: Yes. Discussing patient identified problems/goals with staff: Yes. Medical problems stabilized or resolved: Yes. Denies suicidal/homicidal ideation: Yes. Issues/concerns per patient self-inventory: No. Other: none  New problem(s) identified: No, Describe:  none  New Short Term/Long Term Goal(s): elimination of symptoms of psychosis, medication management for mood stabilization; elimination of SI thoughts; development of comprehensive mental wellness plan.  Patient Goals:  "get everything back on an even keel"  Discharge Plan or Barriers: Pt plans to follow up with his ACTT team with [facility].  Reason for Continuation of Hospitalization: Aggression Depression Medication stabilization  Estimated Length of Stay: 1-7 days  Recreational Therapy: Patient Stressors: N/A Patient Goal: Patient will engage in groups without  prompting or encouragement from LRT x3 group sessions within 5 recreation therapy group sessions Attendees: Patient: Christian [name] 01/31/2020 11:57 AM  Physician: Dr. Weber Cooks, MD 01/31/2020 11:57 AM  Nursing: Polly Cobia, RN 01/31/2020 11:57 AM  RN Care Manager: 01/31/2020 11:57 AM  Social Worker: Assunta Curtis, LCSW 01/31/2020 11:57 AM  Recreational Therapist: Roanna Epley, Reather Converse, LRT 01/31/2020 11:57 AM  Other: Sanjuana Kava, LCSW 01/31/2020 11:57 AM  Other:  01/31/2020 11:57 AM  Other: 01/31/2020 11:57 AM    Scribe for Treatment Team: Rozann Lesches, LCSW 01/31/2020 11:57 AM

## 2020-01-31 NOTE — Progress Notes (Signed)
Recreation Therapy Notes  Date: 01/31/2020  Time: 9:30 am   Location: Craft room   Behavioral response: N/A   Intervention Topic: Coping Skills    Discussion/Intervention: Patient did not attend group.   Clinical Observations/Feedback:  Patient did not attend group.   Emmalina Espericueta LRT/CTRS        Kenney Going 01/31/2020 12:14 PM

## 2020-01-31 NOTE — Plan of Care (Addendum)
Pt rates depression 8/10 ,  hopelessness 7/10  and anxiety 6/10. Pt denies SI, HI and AVH.  Pt was educated on care plan and verbalizes understanding. Pt was encouraged to attend groups. Torrie Mayers RN Problem: Education: Goal: Knowledge of [facility] Education information/materials will improve Outcome: Progressing   Problem: Coping: Goal: Ability to verbalize frustrations and anger appropriately will improve Outcome: Progressing Goal: Ability to demonstrate self-control will improve Outcome: Progressing   Problem: Safety: Goal: Periods of time without injury will increase Outcome: Progressing   Problem: Education: Goal: Utilization of techniques to improve thought processes will improve Outcome: Progressing Goal: Knowledge of the prescribed therapeutic regimen will improve Outcome: Progressing   Problem: Safety: Goal: Ability to identify and utilize support systems that promote safety will improve Outcome: Progressing   Problem: Education: Goal: Knowledge of the prescribed therapeutic regimen will improve Outcome: Progressing   Problem: Coping: Goal: Will verbalize feelings Outcome: Progressing   Problem: Nutritional: Goal: Ability to achieve adequate nutritional intake will improve Outcome: Progressing

## 2020-01-31 NOTE — [facility] Suicide Risk Assessment (Signed)
[facility] INPATIENT:  Family/Significant Other Suicide Prevention Education  Suicide Prevention Education:  Education Completed; [name], daughter/legal guardian [phone] has been identified by the patient as the family member/significant other with whom the patient will be residing, and identified as the person(s) who will aid the patient in the event of a mental health crisis (suicidal ideations/suicide attempt).  With written consent from the patient, the family member/significant other has been provided the following suicide prevention education, prior to the and/or following the discharge of the patient.  The suicide prevention education provided includes the following:  Suicide risk factors  Suicide prevention and interventions  National Suicide Hotline telephone number  [facility] assessment telephone number  [facility] Emergency Assistance 911  [facility] and/or Residential Mobile Crisis Unit telephone number  Request made of family/significant other to:  Remove weapons (e.g., guns, rifles, knives), all items previously/currently identified as safety concern.    Remove drugs/medications (over-the-counter, prescriptions, illicit drugs), all items previously/currently identified as a safety concern.  The family member/significant other verbalizes understanding of the suicide prevention education information provided.  The family member/significant other agrees to remove the items of safety concern listed above. Ms. Wynona Neat reports the pt has a history of bipolar disorder and has been experiencing depressive symptoms. In December 2020, she reports "he had a manic break and threatened to harm his therapist." She states he was then referred to [facility] from December 2020-January 2021. She also reports he was at [facility] for two years and was discharged in 2018. Ms. Wynona Neat reports the pt has been receiving ECT but "recently became combative and  refused treatment." She states he has been followed by [facility] since 2018. She denies him having access to guns or weapons.   Che Rachal T Lain Tetterton 01/31/2020, 3:13 PM

## 2020-02-01 LAB — GLUCOSE, CAPILLARY
Glucose-Capillary: 79 mg/dL (ref 70–99)
Glucose-Capillary: 86 mg/dL (ref 70–99)
Glucose-Capillary: 47 mg/dL — ABNORMAL LOW (ref 70–99)
Glucose-Capillary: 61 mg/dL — ABNORMAL LOW (ref 70–99)
Glucose-Capillary: 65 mg/dL — ABNORMAL LOW (ref 70–99)
Glucose-Capillary: 76 mg/dL (ref 70–99)

## 2020-02-01 LAB — VALPROIC ACID LEVEL: Valproic Acid Lvl: 32 ug/mL — ABNORMAL LOW (ref 50.0–100.0)

## 2020-02-01 MED ORDER — AMLODIPINE BESYLATE 5 MG PO TABS
10.0000 mg | ORAL_TABLET | Freq: Every day | ORAL | Status: DC
  Administered 2020-02-02 – 2020-02-05 (×4): 10 mg via ORAL
  Filled 2020-02-01 (×4): qty 2

## 2020-02-01 MED ORDER — DIVALPROEX SODIUM 500 MG PO DR TAB
500.0000 mg | DELAYED_RELEASE_TABLET | Freq: Two times a day (BID) | ORAL | Status: DC
  Administered 2020-02-01 – 2020-02-05 (×8): 500 mg via ORAL
  Filled 2020-02-01 (×7): qty 1

## 2020-02-01 NOTE — [facility] Counselor (Signed)
CSW spoke with ACT Team members(Craig-teamlead, Genevie Cheshire) at [facility]. Tasia Catchings reports they have been working with the pt for 1.5 yrs and started noticing a "slow decline in functioning." Tasia Catchings reports the pt has a history of catatonia and has been receiving maintenance ECT with Dr. Toni Amend. Tasia Catchings reports the pt most recent hospitalizations were at [facility] and [facility].

## 2020-02-01 NOTE — Plan of Care (Signed)
Patient appears with a flat affect and slow to respond.Patient sated that his depression is not better.Denies SI,HI and AVH.Patient wrote in the self inventory " I need more medicine." Patient stayed in bed most of the shift.No interactions with peers.Did not attend groups.Appetite good.Support and encouragement given.

## 2020-02-01 NOTE — Progress Notes (Signed)
[facility] MD Progress Note  02/01/2020 4:11 PM Christian [name]  MRN:  [id number] Subjective: Follow-up for this patient with bipolar disorder.  Patient seen chart reviewed.  Patient does get up and take care of most of his basic hygiene and has gone to meals and even attended groups although he stays very quiet.  A good bit of the rest of the time he goes back to his bed which is where I found him today.  Eyes open and interactive but speaking very little.  Continues to say he is feeling "not good" but denies suicidal thoughts and denies hallucinations.  Not really able to expand on it any further.  Medically I see that is Depakote level came back quite low which is not much of a surprise.  I also see that his blood pressure continues to be high sometimes very high. Principal Problem: Bipolar affective disorder, depressed, severe, with psychotic behavior (HCC) Diagnosis: Principal Problem:   Bipolar affective disorder, depressed, severe, with psychotic behavior (HCC) Active Problems:   Hypertension   Type 2 diabetes mellitus with complication, without long-term current use of insulin (HCC)  Total Time spent with patient: 30 minutes  Past Psychiatric History: Patient has a long history of bipolar disorder with worsening baseline function  Past Medical History:  Past Medical History:  Diagnosis Date  . Bilateral lower extremity edema: chronic with venous stasis changes 06/02/2014  . Bipolar 1 disorder (HCC)   . Bipolar disorder (HCC)   . Chronic kidney disease   . Gout   . Hx of blood clots   . Hypertension   . Mood swings   . OSA on CPAP 06/02/2014  . Other and unspecified hyperlipidemia 06/02/2014  . Type II or unspecified type diabetes mellitus with unspecified complication, uncontrolled 06/02/2014   History reviewed. No pertinent surgical history. Family History:  Family History  Problem Relation Age of Onset  . Dementia Mother   . Arthritis Father   . Hypertension Father   . Mental illness  Sister   . Diabetes Sister   . Heart failure Neg Hx   . Kidney failure Neg Hx   . Cancer Neg Hx    Family Psychiatric  History: See previous Social History:  Social History   Substance and Sexual Activity  Alcohol Use No     Social History   Substance and Sexual Activity  Drug Use No    Social History   Socioeconomic History  . Marital status: Divorced    Spouse name: Not on file  . Number of children: Not on file  . Years of education: Not on file  . Highest education level: Not on file  Occupational History  . Not on file  Tobacco Use  . Smoking status: Never Smoker  . Smokeless tobacco: Never Used  Substance and Sexual Activity  . Alcohol use: No  . Drug use: No  . Sexual activity: Not Currently  Other Topics Concern  . Not on file  Social History Narrative   *The patient has been married for over 30 years and is currently on disability. He lives with his wife in the [place] area and has one daughter.             Social Determinants of Health   Financial Resource Strain:   . Difficulty of Paying Living Expenses: Not on file  Food Insecurity:   . Worried About Programme researcher, broadcasting/film/video in the Last Year: Not on file  . Ran Out of Food  in the Last Year: Not on file  Transportation Needs:   . Lack of Transportation (Medical): Not on file  . Lack of Transportation (Non-Medical): Not on file  Physical Activity:   . Days of Exercise per Week: Not on file  . Minutes of Exercise per Session: Not on file  Stress:   . Feeling of Stress : Not on file  Social Connections:   . Frequency of Communication with Friends and Family: Not on file  . Frequency of Social Gatherings with Friends and Family: Not on file  . Attends Religious Services: Not on file  . Active Member of Clubs or Organizations: Not on file  . Attends Banker Meetings: Not on file  . Marital Status: Not on file   Additional Social History:                         Sleep:  Fair  Appetite:  Fair  Current Medications: Current Facility-Administered Medications  Medication Dose Route Frequency Provider Last Rate Last Admin  . acetaminophen (TYLENOL) tablet 650 mg  650 mg Oral Q6H PRN Zeriyah Wain T, MD      . alum & mag hydroxide-simeth (MAALOX/MYLANTA) 200-200-20 MG/5ML suspension 30 mL  30 mL Oral Q4H PRN Lavera Vandermeer, Jackquline Denmark, MD      . Melene Muller ON 02/02/2020] amLODipine (NORVASC) tablet 10 mg  10 mg Oral Daily Haylen Shelnutt,  T, MD      . ARIPiprazole (ABILIFY) tablet 20 mg  20 mg Oral Daily Tejal Monroy, Jackquline Denmark, MD   20 mg at 02/01/20 0819  . aspirin tablet 325 mg  325 mg Oral Daily Ellayna Hilligoss, Jackquline Denmark, MD   325 mg at 02/01/20 0819  . atorvastatin (LIPITOR) tablet 10 mg  10 mg Oral q1800 Charlen Bakula, Jackquline Denmark, MD   10 mg at 01/31/20 1736  . clonazePAM (KLONOPIN) tablet 0.5 mg  0.5 mg Oral BID Ziggy Chanthavong, Jackquline Denmark, MD   0.5 mg at 02/01/20 0818  . divalproex (DEPAKOTE) DR tablet 500 mg  500 mg Oral Q12H Yadhira Mckneely T, MD      . furosemide (LASIX) tablet 20 mg  20 mg Oral Daily Sierra Bissonette, Jackquline Denmark, MD   20 mg at 02/01/20 0820  . glimepiride (AMARYL) tablet 2 mg  2 mg Oral Q breakfast Queena Monrreal T, MD   2 mg at 02/01/20 0730  . insulin aspart (novoLOG) injection 0-15 Units  0-15 Units Subcutaneous TID WC Amiley Shishido, Jackquline Denmark, MD   3 Units at 01/31/20 1133  . insulin glargine (LANTUS) injection 34 Units  34 Units Subcutaneous Daily Lurline Caver, Jackquline Denmark, MD   34 Units at 02/01/20 [phone]  . liothyronine (CYTOMEL) tablet 25 mcg  25 mcg Oral Daily Joaovictor Krone, Jackquline Denmark, MD   25 mcg at 02/01/20 [phone]  . lisinopril (ZESTRIL) tablet 40 mg  40 mg Oral Daily Dennette Faulconer, Jackquline Denmark, MD   40 mg at 02/01/20 5885  . magnesium hydroxide (MILK OF MAGNESIA) suspension 30 mL  30 mL Oral Daily PRN Tracie Lindbloom T, MD      . metFORMIN (GLUCOPHAGE-XR) 24 hr tablet 1,000 mg  1,000 mg Oral Q breakfast Tyneisha Hegeman T, MD   1,000 mg at 02/01/20 0730  . metoprolol succinate (TOPROL-XL) 24 hr tablet 100 mg  100 mg Oral Daily Arvind Mexicano, Jackquline Denmark, MD    100 mg at 02/01/20 0818  . rivaroxaban (XARELTO) tablet 20 mg  20 mg Oral Q supper , Jackquline Denmark, MD  20 mg at 01/31/20 1737    Lab Results:  Results for orders placed or performed during the hospital encounter of 01/30/20 (from the past 48 hour(s))  Glucose, capillary     Status: Abnormal   Collection Time: 01/31/20  7:04 AM  Result Value Ref Range   Glucose-Capillary 138 (H) 70 - 99 mg/dL    Comment: Glucose reference range applies only to samples taken after fasting for at least 8 hours.  Glucose, capillary     Status: Abnormal   Collection Time: 01/31/20 11:19 AM  Result Value Ref Range   Glucose-Capillary 156 (H) 70 - 99 mg/dL    Comment: Glucose reference range applies only to samples taken after fasting for at least 8 hours.   Comment 1 Notify RN   Glucose, capillary     Status: None   Collection Time: 01/31/20  4:11 PM  Result Value Ref Range   Glucose-Capillary 73 70 - 99 mg/dL    Comment: Glucose reference range applies only to samples taken after fasting for at least 8 hours.   Comment 1 Notify RN   Glucose, capillary     Status: None   Collection Time: 01/31/20 11:01 PM  Result Value Ref Range   Glucose-Capillary 91 70 - 99 mg/dL    Comment: Glucose reference range applies only to samples taken after fasting for at least 8 hours.  Glucose, capillary     Status: None   Collection Time: 02/01/20  6:40 AM  Result Value Ref Range   Glucose-Capillary 79 70 - 99 mg/dL    Comment: Glucose reference range applies only to samples taken after fasting for at least 8 hours.  Valproic acid level     Status: Abnormal   Collection Time: 02/01/20  9:19 AM  Result Value Ref Range   Valproic Acid Lvl 32 (L) 50.0 - 100.0 ug/mL    Comment: Performed at [facility], [address] [place], [place] [address]  Glucose, capillary     Status: None   Collection Time: 02/01/20 11:33 AM  Result Value Ref Range   Glucose-Capillary 86 70 - 99 mg/dL    Comment: Glucose  reference range applies only to samples taken after fasting for at least 8 hours.    Blood Alcohol level:  Lab Results  Component Value Date   ETH <10 01/30/2020   ETH <10 10/31/2019    Metabolic Disorder Labs: Lab Results  Component Value Date   HGBA1C 7.1 (H) 01/30/2020   MPG 157.07 01/30/2020   MPG 186 (H) 07/02/2014   No results found for: PROLACTIN Lab Results  Component Value Date   CHOL 108 12/28/2015   TRIG 62 12/28/2015   HDL 33 (L) 12/28/2015   CHOLHDL 3.3 12/28/2015   VLDL 12 12/28/2015   LDLCALC 63 12/28/2015   LDLCALC  03/08/2011    53        Total Cholesterol/HDL:CHD Risk Coronary Heart Disease Risk Table                     Men   Women  1/2 Average Risk   3.4   3.3  Average Risk       5.0   4.4  2 X Average Risk   9.6   7.1  3 X Average Risk  23.4   11.0        Use the calculated Patient Ratio above and the CHD Risk Table to determine the patient's CHD Risk.  ATP III CLASSIFICATION (LDL):  <100     mg/dL   Optimal  448-185  mg/dL   Near or Above                    Optimal  130-159  mg/dL   Borderline  631-497  mg/dL   High  >026     mg/dL   Very High    Physical Findings: AIMS:  , ,  ,  ,    CIWA:    COWS:     Musculoskeletal: Strength & Muscle Tone: within normal limits Gait & Station: normal Patient leans: N/A  Psychiatric Specialty Exam: Physical Exam  Nursing note and vitals reviewed. Constitutional: He appears well-developed and well-nourished.  HENT:  Head: Normocephalic and atraumatic.  Eyes: Pupils are equal, round, and reactive to light. Conjunctivae are normal.  Cardiovascular: Regular rhythm and normal heart sounds.  Respiratory: Effort normal. No respiratory distress.  GI: Soft.  Musculoskeletal:        General: Normal range of motion.     Cervical back: Normal range of motion.  Neurological: He is alert.  Skin: Skin is warm and dry.  Psychiatric: His affect is blunt. His speech is delayed. He is slowed.  Thought content is not paranoid. Cognition and memory are impaired. He expresses no homicidal and no suicidal ideation.    Review of Systems  Constitutional: Negative.   HENT: Negative.   Eyes: Negative.   Respiratory: Negative.   Cardiovascular: Negative.   Gastrointestinal: Negative.   Musculoskeletal: Negative.   Skin: Negative.   Neurological: Negative.   Psychiatric/Behavioral: Positive for dysphoric mood. Negative for suicidal ideas.    Blood pressure (!) 148/94, pulse 92, temperature 97.9 F (36.6 C), temperature source Oral, resp. rate 17, height 6' (1.829 m), weight 117.5 kg, SpO2 94 %.Body mass index is 35.13 kg/m.  General Appearance: Casual  Eye Contact:  Fair  Speech:  Blocked and Slow  Volume:  Decreased  Mood:  Dysphoric  Affect:  Constricted  Thought Process:  Coherent  Orientation:  Full (Time, Place, and Person)  Thought Content:  Rumination  Suicidal Thoughts:  No  Homicidal Thoughts:  No  Memory:  Immediate;   Fair Recent;   Fair Remote;   Fair  Judgement:  Impaired  Insight:  Shallow  Psychomotor Activity:  Decreased  Concentration:  Concentration: Poor  Recall:  [employer] of Knowledge:  Fair  Language:  Fair  Akathisia:  No  Handed:  Right  AIMS (if indicated):     Assets:  Desire for Improvement Housing Resilience Social Support  ADL's:  Impaired  Cognition:  Impaired,  Mild  Sleep:  Number of Hours: 5.15     Treatment Plan Summary: Daily contact with patient to assess and evaluate symptoms and progress in treatment, Medication management and Plan 69 year old man with recurrent bipolar disorder currently with features consistent probably with more of a psychotic depression.  Patient was aggressively resistant to ECT treatment but clearly declining in functioning which was part of the reason for admission.  Family felt he was no longer safely manageable at home.  Since being in the hospital he has stabilized a little bit.  There is no sign of  acute dangerousness today at least.  I am increasing his Depakote to 500 mg twice a day and I am increasing his amlodipine to 10 mg.  Spoke with the patient again about the plan and how if he is reasonably stable after the weekend  we will consider having him go back to his apartment.  He is not on the ECT schedule for Monday.  Alethia Berthold, MD 02/01/2020, 4:11 PM

## 2020-02-01 NOTE — Plan of Care (Signed)
  Problem: Education: Goal: Utilization of techniques to improve thought processes will improve Outcome: Progressing  Patient interacting appropriately with peers noted in the milieu socializing.

## 2020-02-01 NOTE — Progress Notes (Signed)
Recreation Therapy Notes  INPATIENT RECREATION THERAPY ASSESSMENT  Patient Details Name: Christian [name] MRN: [id number] DOB: [birth year] Today's Date: 02/01/2020       Information Obtained From: Patient  Able to Participate in Assessment/Interview: Yes  Patient Presentation: Responsive  Reason for Admission (Per Patient): Active Symptoms  Patient Stressors:    Coping Skills:   TV, Music, Prayer  Leisure Interests (2+):  Music - Listen, Music - Singing, Individual - TV  Frequency of Recreation/Participation: Weekly  Awareness of Community Resources:  Yes  Community Resources:  [employer]  Current Use:    If no, Barriers?:    Expressed Interest in [employer] Information:    [place] of Residence:  [place]  Patient Main Form of Transportation: Taxi  Patient Strengths:  Caring  Patient Identified Areas of Improvement:  My medication  Patient Goal for Hospitalization:  Do what I can  Current SI (including self-harm):  No  Current HI:  No  Current AVH: No  Staff Intervention Plan: Group Attendance, Collaborate with Interdisciplinary Treatment Team  Consent to Intern Participation: N/A  Christian [name] 02/01/2020, 2:00 PM

## 2020-02-01 NOTE — Progress Notes (Signed)
Recreation Therapy Notes  Date: 02/01/2020  Time: 9:30 am   Location: Craft room   Behavioral response: N/A   Intervention Topic: Self-esteem   Discussion/Intervention: Patient did not attend group.   Clinical Observations/Feedback:  Patient did not attend group.   Norinne Jeane LRT/CTRS        Nevaeh Casillas 02/01/2020 10:42 AM 

## 2020-02-01 NOTE — [facility] Group Notes (Signed)
[facility] Group Notes:  (Nursing/MHT/Case Management/Adjunct)  Date:  02/01/2020  Time:  1:39 AM  Type of Therapy:  Group Therapy  Participation Level:  None  Participation Quality:  Appropriate  Affect:  Appropriate  Cognitive:  Alert and Appropriate  Insight:  Appropriate  Engagement in Group:  Engaged  Modes of Intervention:  Education  Summary of Progress/Problems:  Christian [name] 02/01/2020, 1:39 AM

## 2020-02-01 NOTE — B[facility] Counselor (Signed)
CSW spoke with Nadine Bialas(pt's ex-wife) at [phone]. Ms. Hartis voiced concern about the decline in the pt's functioning. Ms. Simonin reports the pt has a  history of receiving ECT, prior to hospitalization had refused to attend scheduled appointment. She states the pt experiences paranoia, verbal outburst and disorganized thinking. She says four years ago, the pt received ECT with Dr. Toni Amend and made some progress. She also discussed the pt spending two years at [facility], stating "he thrived in that environment." Ms. Pavlak reports her and her daughter go to the patient's home daily to make sure he has food and has taken his medication. She reports he is living in an independent living facility.

## 2020-02-01 NOTE — [facility] Group Notes (Signed)
Feelings Around Relapse 02/01/2020 1PM  Type of Therapy and Topic:  Group Therapy:  Feelings around Relapse and Recovery  Participation Level:  Did Not Attend   Description of Group:    Patients in this group will discuss emotions they experience before and after a relapse. They will process how experiencing these feelings, or avoidance of experiencing them, relates to having a relapse. Facilitator will guide patients to explore emotions they have related to recovery. Patients will be encouraged to process which emotions are more powerful. They will be guided to discuss the emotional reaction significant others in their lives may have to patients' relapse or recovery. Patients will be assisted in exploring ways to respond to the emotions of others without this contributing to a relapse.  Therapeutic Goals: 1. Patient will identify two or more emotions that lead to a relapse for them 2. Patient will identify two emotions that result when they relapse 3. Patient will identify two emotions related to recovery 4. Patient will demonstrate ability to communicate their needs through discussion and/or role plays   Summary of Patient Progress:     Therapeutic Modalities:   Cognitive Behavioral Therapy Solution-Focused Therapy Assertiveness Training Relapse Prevention Therapy   Suzan Slick, LCSW 02/01/2020 1:44 PM

## 2020-02-01 NOTE — Progress Notes (Signed)
Patient alert and oriented x 4, affect is blunted, thoughts are organized he was noted to have delayed response during interaction, he is interacting appropriately with peers and staff.   Patient rated his depression a 7/10  ( 0 low- 10 high) he is scheduled ECT tomorrow for depression. Patient was offered support and encouraged to attend evening wrap up group. Patient attended evening group, he was complaint with medication regimen.15 minutes safety checks maintained will continue to monitor.

## 2020-02-01 NOTE — [facility] Group Notes (Signed)
[facility] Group Notes:  (Nursing/MHT/Case Management/Adjunct)  Date:  02/01/2020  Time:  8:30AM Type of Therapy:  Community Meeting   Participation Level:  Did Not Attend   Hulda Marin 02/01/2020, 10:19 AM

## 2020-02-02 DIAGNOSIS — F315 Bipolar disorder, current episode depressed, severe, with psychotic features: Secondary | ICD-10-CM

## 2020-02-02 LAB — COMPREHENSIVE METABOLIC PANEL
ALT: 12 U/L (ref 0–44)
AST: 14 U/L — ABNORMAL LOW (ref 15–41)
Albumin: 3.2 g/dL — ABNORMAL LOW (ref 3.5–5.0)
Alkaline Phosphatase: 93 U/L (ref 38–126)
Anion gap: 13 (ref 5–15)
BUN: 20 mg/dL (ref 8–23)
CO2: 27 mmol/L (ref 22–32)
Calcium: 9 mg/dL (ref 8.9–10.3)
Chloride: 94 mmol/L — ABNORMAL LOW (ref 98–111)
Creatinine, Ser: 0.96 mg/dL (ref 0.61–1.24)
GFR calc Af Amer: >60 mL/min (ref >60)
GFR calc non Af Amer: >60 mL/min (ref >60)
Glucose, Bld: 187 mg/dL — ABNORMAL HIGH (ref 70–99)
Potassium: 4 mmol/L (ref 3.5–5.1)
Sodium: 134 mmol/L — ABNORMAL LOW (ref 135–145)
Total Bilirubin: 0.6 mg/dL (ref 0.3–1.2)
Total Protein: 6.7 g/dL (ref 6.5–8.1)

## 2020-02-02 LAB — GLUCOSE, CAPILLARY
Glucose-Capillary: 58 mg/dL — ABNORMAL LOW (ref 70–99)
Glucose-Capillary: 82 mg/dL (ref 70–99)

## 2020-02-02 LAB — GLUCOSE, RANDOM
Glucose, Bld: 187 mg/dL — ABNORMAL HIGH (ref 70–99)
Glucose, Bld: 116 mg/dL — ABNORMAL HIGH (ref 70–99)
Glucose, Bld: 78 mg/dL (ref 70–99)

## 2020-02-02 MED ORDER — BUPROPION HCL 75 MG PO TABS
75.0000 mg | ORAL_TABLET | Freq: Every day | ORAL | Status: DC
  Administered 2020-02-02 – 2020-02-05 (×4): 75 mg via ORAL
  Filled 2020-02-02 (×6): qty 1

## 2020-02-02 NOTE — Plan of Care (Signed)
Patient denies SI/HI/AVH. Patient is minimal during assessment. Patient is isolated to room.    Problem: Coping: Goal: Ability to verbalize frustrations and anger appropriately will improve Outcome: Not Progressing Goal: Ability to demonstrate self-control will improve Outcome: Not Progressing   Problem: Education: Goal: Knowledge of [place] [facility] Education information/materials will improve Outcome: Not Progressing

## 2020-02-02 NOTE — Progress Notes (Signed)
D: Patient's affect flat and mood is sad. Denies SI HI and AVH. Blood sugar 58 at HS. Given orange juice and peanut butter and crackers. Denies any symptoms of hypoglycemia. CBG rechecked and blood sugar is 89.  A: continue to monitor for safety R: Safety maintained.

## 2020-02-02 NOTE — [facility] Group Notes (Signed)
LCSW Group Therapy Notes  Date and Time: 02/02/2020 1:00PM  Type of Therapy and Topic: Group Therapy: Healthy Vs. Unhealthy Coping Strategies  Participation Level: [facility] PARTICIPATION LEVEL: Did Not Attend  Description of Group:  In this group, patients will be encouraged to explore their healthy and unhealthy coping strategics. Coping strategies are actions that we take to deal with stress, problems, or uncomfortable emotions in our daily lives. Each patient will be challenged to read some scenarios and discuss the unhealthy and healthy coping strategies within those scenarios. Also, each patient will be challenged to describe current healthy and unhealthy strategies that they use in their own lives and discuss the outcomes and barriers to those strategies. This group will be process-oriented, with patients participating in exploration of their own experiences as well as giving and receiving support and challenge from other group members.  Therapeutic Goals: 1. Patient will identify personal healthy and unhealthy coping strategies. 2. Patient will identify healthy and unhealthy coping strategies, in others, through scenarios.  3. Patient will identify expected outcomes of healthy and unhealthy coping strategies. 4. Patient will identify barriers to using healthy coping strategies.   Summary of Patient Progress:  X   Therapeutic Modalities:  Cognitive Behavioral Therapy Solution Focused Therapy Motivational Interviewing   Mont Judeth Gilles, MSW, LCSWA Clinical Social Worker    

## 2020-02-02 NOTE — Progress Notes (Signed)
Patient alert and oriented x 4, affect is blunted, thoughts are organized she was noted isolated to his room most of the shift he rated his depression a 6/10 on a scale  0 low- 10 high) patient appears sad no bizarre behavior noted  she denies SI/HI/AVH. Patient refused sleeping medication, he didn't get snack either. No distress noted 15 minutes safety checks maintianed will continue to monitor.

## 2020-02-02 NOTE — Plan of Care (Signed)
  Problem: Education: Goal: Knowledge of [facility] Education information/materials will improve Outcome: Progressing  D: Patient's affect flat and mood is sad. Denies SI HI and AVH. Blood sugar 58 at HS. Given orange juice and peanut butter and crackers. Denies any symptoms of hypoglycemia. CBG rechecked and blood sugar is 89.  A: continue to monitor for safety R: Safety maintained.

## 2020-02-02 NOTE — Progress Notes (Signed)
[facility] MD Progress Note  02/02/2020 10:35 AM Christian [name]  MRN:  [id number] Subjective: Patient is a 69 year old male with a past psychiatric history significant for bipolar disorder.  He was admitted on 01/31/2020 and was receiving ECT, but on the day prior to admission became so agitated and hostile that it was impossible to proceed with ECT.  He was admitted to the hospital for evaluation and and stabilization.  Objective: Patient is seen and examined.  Patient is a 69 year old male with the above-stated past psychiatric history who is seen in follow-up.  He has spoken to staff this morning and was alert and oriented to 4.  His only comment to me this morning was that he did not feel "good".  He denied suicidal ideation.  He denied auditory or visual hallucinations.  Nursing staff relate that he has remained isolated.  His current medications include Abilify, clonazepam, Depakote, and Cytomel for augmentation.  His blood pressure has been elevated throughout the course the hospitalization.  His blood pressure this morning is 157/110.  His pulse was 110.  He is afebrile.  He did sleep 8 hours last night.  His blood sugar this morning was 187.  His hemoglobin A1c was 7.1.  Drug screen was positive for tricyclic's.  His current blood pressure medications include amlodipine 10 mg p.o. daily, furosemide 20 mg p.o. daily lisinopril 40 mg p.o. daily and metoprolol XL 100 mg p.o. daily.  His diabetic medications include glimepiride 2 mg p.o. every morning, sliding scale insulin, Lantus 34 units subcu daily, and Metformin XR 1000 mg p.o. daily.  Principal Problem: Bipolar affective disorder, depressed, severe, with psychotic behavior (HCC) Diagnosis: Principal Problem:   Bipolar affective disorder, depressed, severe, with psychotic behavior (HCC) Active Problems:   Hypertension   Type 2 diabetes mellitus with complication, without long-term current use of insulin (HCC)  Total Time spent with patient: 20  minutes  Past Psychiatric History: See admission H&P  Past Medical History:  Past Medical History:  Diagnosis Date  . Bilateral lower extremity edema: chronic with venous stasis changes 06/02/2014  . Bipolar 1 disorder (HCC)   . Bipolar disorder (HCC)   . Chronic kidney disease   . Gout   . Hx of blood clots   . Hypertension   . Mood swings   . OSA on CPAP 06/02/2014  . Other and unspecified hyperlipidemia 06/02/2014  . Type II or unspecified type diabetes mellitus with unspecified complication, uncontrolled 06/02/2014   History reviewed. No pertinent surgical history. Family History:  Family History  Problem Relation Age of Onset  . Dementia Mother   . Arthritis Father   . Hypertension Father   . Mental illness Sister   . Diabetes Sister   . Heart failure Neg Hx   . Kidney failure Neg Hx   . Cancer Neg Hx    Family Psychiatric  History: See admission H&P Social History:  Social History   Substance and Sexual Activity  Alcohol Use No     Social History   Substance and Sexual Activity  Drug Use No    Social History   Socioeconomic History  . Marital status: Divorced    Spouse name: Not on file  . Number of children: Not on file  . Years of education: Not on file  . Highest education level: Not on file  Occupational History  . Not on file  Tobacco Use  . Smoking status: Never Smoker  . Smokeless tobacco: Never Used  Substance and  Sexual Activity  . Alcohol use: No  . Drug use: No  . Sexual activity: Not Currently  Other Topics Concern  . Not on file  Social History Narrative   *The patient has been married for over 30 years and is currently on disability. He lives with his wife in the [place] area and has one daughter.             Social Determinants of Health   Financial Resource Strain:   . Difficulty of Paying Living Expenses: Not on file  Food Insecurity:   . Worried About Programme researcher, broadcasting/film/video in the Last Year: Not on file  . Ran Out of Food in the  Last Year: Not on file  Transportation Needs:   . Lack of Transportation (Medical): Not on file  . Lack of Transportation (Non-Medical): Not on file  Physical Activity:   . Days of Exercise per Week: Not on file  . Minutes of Exercise per Session: Not on file  Stress:   . Feeling of Stress : Not on file  Social Connections:   . Frequency of Communication with Friends and Family: Not on file  . Frequency of Social Gatherings with Friends and Family: Not on file  . Attends Religious Services: Not on file  . Active Member of Clubs or Organizations: Not on file  . Attends Banker Meetings: Not on file  . Marital Status: Not on file   Additional Social History:                         Sleep: Fair  Appetite:  Fair  Current Medications: Current Facility-Administered Medications  Medication Dose Route Frequency Provider Last Rate Last Admin  . acetaminophen (TYLENOL) tablet 650 mg  650 mg Oral Q6H PRN Clapacs, John T, MD      . alum & mag hydroxide-simeth (MAALOX/MYLANTA) 200-200-20 MG/5ML suspension 30 mL  30 mL Oral Q4H PRN Clapacs, John T, MD      . amLODipine (NORVASC) tablet 10 mg  10 mg Oral Daily Clapacs, Jackquline Denmark, MD   10 mg at 02/02/20 6314  . ARIPiprazole (ABILIFY) tablet 20 mg  20 mg Oral Daily Clapacs, Jackquline Denmark, MD   20 mg at 02/02/20 9702  . aspirin tablet 325 mg  325 mg Oral Daily Clapacs, Jackquline Denmark, MD   325 mg at 02/02/20 6378  . atorvastatin (LIPITOR) tablet 10 mg  10 mg Oral q1800 Clapacs, Jackquline Denmark, MD   10 mg at 02/01/20 1808  . clonazePAM (KLONOPIN) tablet 0.5 mg  0.5 mg Oral BID Clapacs, Jackquline Denmark, MD   0.5 mg at 02/02/20 0828  . divalproex (DEPAKOTE) DR tablet 500 mg  500 mg Oral Q12H Clapacs, John T, MD   500 mg at 02/02/20 5885  . furosemide (LASIX) tablet 20 mg  20 mg Oral Daily Clapacs, Jackquline Denmark, MD   20 mg at 02/02/20 0829  . glimepiride (AMARYL) tablet 2 mg  2 mg Oral Q breakfast Clapacs, Jackquline Denmark, MD   2 mg at 02/02/20 0829  . insulin aspart (novoLOG)  injection 0-15 Units  0-15 Units Subcutaneous TID WC Clapacs, Jackquline Denmark, MD   3 Units at 01/31/20 1133  . insulin glargine (LANTUS) injection 34 Units  34 Units Subcutaneous Daily Clapacs, Jackquline Denmark, MD   34 Units at 02/01/20 [phone]  . liothyronine (CYTOMEL) tablet 25 mcg  25 mcg Oral Daily Clapacs, Jackquline Denmark, MD   25  mcg at 02/02/20 0828  . lisinopril (ZESTRIL) tablet 40 mg  40 mg Oral Daily Clapacs, Jackquline Denmark, MD   40 mg at 02/02/20 0641  . magnesium hydroxide (MILK OF MAGNESIA) suspension 30 mL  30 mL Oral Daily PRN Clapacs, John T, MD      . metFORMIN (GLUCOPHAGE-XR) 24 hr tablet 1,000 mg  1,000 mg Oral Q breakfast Clapacs, Jackquline Denmark, MD   1,000 mg at 02/02/20 0828  . metoprolol succinate (TOPROL-XL) 24 hr tablet 100 mg  100 mg Oral Daily Clapacs, Jackquline Denmark, MD   100 mg at 02/02/20 9323  . rivaroxaban (XARELTO) tablet 20 mg  20 mg Oral Q supper Clapacs, Jackquline Denmark, MD   20 mg at 02/01/20 1709    Lab Results:  Results for orders placed or performed during the hospital encounter of 01/30/20 (from the past 48 hour(s))  Glucose, capillary     Status: Abnormal   Collection Time: 01/31/20 11:19 AM  Result Value Ref Range   Glucose-Capillary 156 (H) 70 - 99 mg/dL    Comment: Glucose reference range applies only to samples taken after fasting for at least 8 hours.   Comment 1 Notify RN   Glucose, capillary     Status: None   Collection Time: 01/31/20  4:11 PM  Result Value Ref Range   Glucose-Capillary 73 70 - 99 mg/dL    Comment: Glucose reference range applies only to samples taken after fasting for at least 8 hours.   Comment 1 Notify RN   Glucose, capillary     Status: None   Collection Time: 01/31/20 11:01 PM  Result Value Ref Range   Glucose-Capillary 91 70 - 99 mg/dL    Comment: Glucose reference range applies only to samples taken after fasting for at least 8 hours.  Glucose, capillary     Status: None   Collection Time: 02/01/20  6:40 AM  Result Value Ref Range   Glucose-Capillary 79 70 - 99 mg/dL     Comment: Glucose reference range applies only to samples taken after fasting for at least 8 hours.  Valproic acid level     Status: Abnormal   Collection Time: 02/01/20  9:19 AM  Result Value Ref Range   Valproic Acid Lvl 32 (L) 50.0 - 100.0 ug/mL    Comment: Performed at [facility], [address] [place], [place] [address]  Glucose, capillary     Status: None   Collection Time: 02/01/20 11:33 AM  Result Value Ref Range   Glucose-Capillary 86 70 - 99 mg/dL    Comment: Glucose reference range applies only to samples taken after fasting for at least 8 hours.  Glucose, capillary     Status: Abnormal   Collection Time: 02/01/20  4:49 PM  Result Value Ref Range   Glucose-Capillary 40 (LL) 70 - 99 mg/dL    Comment: Glucose reference range applies only to samples taken after fasting for at least 8 hours.   Comment 1 Notify RN   Glucose, capillary     Status: Abnormal   Collection Time: 02/01/20  4:51 PM  Result Value Ref Range   Glucose-Capillary 47 (L) 70 - 99 mg/dL    Comment: Glucose reference range applies only to samples taken after fasting for at least 8 hours.  Glucose, capillary     Status: Abnormal   Collection Time: 02/01/20  5:21 PM  Result Value Ref Range   Glucose-Capillary 61 (L) 70 - 99 mg/dL    Comment:  Glucose reference range applies only to samples taken after fasting for at least 8 hours.  Glucose, capillary     Status: Abnormal   Collection Time: 02/01/20  6:07 PM  Result Value Ref Range   Glucose-Capillary 65 (L) 70 - 99 mg/dL    Comment: Glucose reference range applies only to samples taken after fasting for at least 8 hours.  Glucose, capillary     Status: None   Collection Time: 02/01/20  6:57 PM  Result Value Ref Range   Glucose-Capillary 76 70 - 99 mg/dL    Comment: Glucose reference range applies only to samples taken after fasting for at least 8 hours.  Glucose, random     Status: Abnormal   Collection Time: 02/02/20  9:29 AM  Result Value Ref  Range   Glucose, Bld 187 (H) 70 - 99 mg/dL    Comment: Glucose reference range applies only to samples taken after fasting for at least 8 hours. Performed at [facility], [address] [place], [place] [address]     Blood Alcohol level:  Lab Results  Component Value Date   [facility] <10 01/30/2020   ETH <10 58/83/2549    Metabolic Disorder Labs: Lab Results  Component Value Date   HGBA1C 7.1 (H) 01/30/2020   MPG 157.07 01/30/2020   MPG 186 (H) 07/02/2014   No results found for: PROLACTIN Lab Results  Component Value Date   CHOL 108 12/28/2015   TRIG 62 12/28/2015   HDL 33 (L) 12/28/2015   CHOLHDL 3.3 12/28/2015   VLDL 12 12/28/2015   LDLCALC 63 12/28/2015   LDLCALC  03/08/2011    53        Total Cholesterol/HDL:CHD Risk Coronary Heart Disease Risk Table                     Men   Women  1/2 Average Risk   3.4   3.3  Average Risk       5.0   4.4  2 X Average Risk   9.6   7.1  3 X Average Risk  23.4   11.0        Use the calculated Patient Ratio above and the CHD Risk Table to determine the patient's CHD Risk.        ATP III CLASSIFICATION (LDL):  <100     mg/dL   Optimal  100-129  mg/dL   Near or Above                    Optimal  130-159  mg/dL   Borderline  160-189  mg/dL   High  >190     mg/dL   Very High    Physical Findings: AIMS:  , ,  ,  ,    CIWA:    COWS:     Musculoskeletal: Strength & Muscle Tone: within normal limits Gait & Station: normal Patient leans: N/A  Psychiatric Specialty Exam: Physical Exam  Nursing note and vitals reviewed. Constitutional: He is oriented to person, place, and time. He appears well-developed and well-nourished.  HENT:  Head: Normocephalic and atraumatic.  Respiratory: Effort normal.  Neurological: He is alert and oriented to person, place, and time.    Review of Systems  Blood pressure (!) 157/110, pulse (!) 110, temperature 98.1 F (36.7 C), temperature source Oral, resp. rate 18, height 6' (1.829  m), weight 117.5 kg, SpO2 92 %.Body mass index is 35.13 kg/m.  General Appearance: Disheveled  Eye Contact:  Fair  Speech:  Normal Rate  Volume:  Decreased  Mood:  Dysphoric  Affect:  Blunt  Thought Process:  Goal Directed and Descriptions of Associations: Circumstantial  Orientation:  Full (Time, Place, and Person)  Thought Content:  Rumination  Suicidal Thoughts:  No  Homicidal Thoughts:  No  Memory:  Immediate;   Poor Recent;   Poor Remote;   Poor  Judgement:  Intact  Insight:  Lacking  Psychomotor Activity:  Psychomotor Retardation  Concentration:  Concentration: Fair and Attention Span: Fair  Recall:  Poor  Fund of Knowledge:  Poor  Language:  Fair  Akathisia:  Negative  Handed:  Right  AIMS (if indicated):     Assets:  Desire for Improvement Resilience  ADL's:  Intact  Cognition:  WNL  Sleep:  Number of Hours: 8     Treatment Plan Summary: Daily contact with patient to assess and evaluate symptoms and progress in treatment, Medication management and Plan : Patient is seen and examined.  Patient is a 69 year old male with the above-stated past psychiatric history who is seen in follow-up.  Diagnosis: #1 bipolar disorder, most recently depressed, severe with psychotic features, #2 hypertension, #3 type 2 diabetes mellitus, #4 reported history of chronic lower extremity edema, #5 history of hyperlipidemia, #6 history of blood clots on chronic anticoagulation  Patient is seen in follow-up.  Review of the electronic medical record reveals this is essentially unchanged from yesterday.  He was apparently given ECT, but was aggressively resistant to ECT treatment.  He had declining in function worsening to his mental illness, and that was part of the hospitalization.  His family felt he was no longer safe to be managed at home.  His Depakote was increased yesterday, and as well as amlodipine secondary to his blood pressure.  ECT was not scheduled for 02/04/2020.  Review of the  electronic medical record revealed a previous episode of sepsis and catatonia at the [facility] on 08/16/2018.  It appears he had been on mirtazapine at that time.  Also on a visit on 10/18/2019 he appears as though he had been also given Wellbutrin XL, also fluoxetine.  He had also been previously treated with Saphris.  It also appears that he had been given Provigil in the past as well.  I cannot find any episodes of agitation outside of what occurred with the ECT.  I am cautiously going to add Wellbutrin short acting 75 mg p.o. daily to see if we can get him moving in the right direction without causing a manic episode.  We will watch this very cautiously.  I do not see any history of seizure disorder in any of his electronic medical record information.  With regard to his hypertension and a history of atrial flutter, his metoprolol succinate had been increased to 125 mg p.o. daily per cardiology on 01/02/2020.  His blood pressure remained elevated at 150/98 on that visit.  He was started on amlodipine 5 mg p.o. daily at that time.  He reportedly has a history of impaired renal function.  His creatinine on 3/3 was 1.13.  Sodium was 138 and potassium was 3.4 on admission.  I am going to repeat his electrolytes and if possible increase his Lasix to 40 mg p.o. daily.  I am also going to get an EKG today to make sure what his rhythm is given the fact that if he is in flutter perhaps this is a result of that.  I am  also going to order Accu-Cheks 3 times daily and before meals to get a better idea of what his blood sugars doing before I change any of those medications.  1.  Add Wellbutrin short acting 75 mg p.o. daily for depression, but monitor for any manic behavior that occurs following this. 2.  Continue amlodipine 10 mg p.o. daily for hypertension and rate control. 3.  Continue Abilify 20 mg p.o. daily for psychosis. 4.  Continue coated aspirin 325 mg p.o. daily for heart health. 5.  Continue Lipitor  10 mg p.o. daily for hyperlipidemia. 6.  Continue clonazepam 0.5 mg p.o. twice daily for anxiety. 7.  Continue Depakote DR 500 mg p.o. every 12 hours for mood stability. 8.  Continue Amaryl 2 mg p.o. daily for diabetes mellitus. 9.  Continue sliding scale insulin for diabetes mellitus. 10.  Continue Lantus insulin 34 units subcu daily for diabetes mellitus. 11.  Continue Cytomel 25 mcg p.o. daily for depression augmentation treatment. 12.  Continue lisinopril 40 mg p.o. daily for hypertension. 13.  Continue Metformin XR 1000 mg p.o. daily for diabetes mellitus. 14.  Continue metoprolol succinate 100 mg p.o. daily for rate control as well as hypertension. 15.  Continue Xarelto 20 mg p.o. daily for anticoagulation secondary to emboli. 16.  Order metabolic panel today. 17.  Get an EKG to assess rhythm. 18.  Change Accu-Cheks to 3 times daily AC. 19.  Disposition planning-in progress. Antonieta Pert, MD 02/02/2020, 10:35 AM

## 2020-02-02 NOTE — [facility] Group Notes (Signed)
[facility] Group Notes:  (Nursing/MHT/Case Management/Adjunct)  Date:  02/02/2020  Time:  10:38 PM  Type of Therapy:  Group Therapy  Participation Level:  Active  Participation Quality:  Appropriate  Affect:  Appropriate  Cognitive:  Alert  Insight:  Good  Engagement in Group:  Engaged  Modes of Intervention:  Support  Summary of Progress/Problems:  Christian [name] 02/02/2020, 10:38 PM

## 2020-02-03 DIAGNOSIS — F315 Bipolar disorder, current episode depressed, severe, with psychotic features: Secondary | ICD-10-CM

## 2020-02-03 LAB — CBC
HCT: 45.5 % (ref 39.0–52.0)
Hemoglobin: 14.9 g/dL (ref 13.0–17.0)
MCH: 26.2 pg (ref 26.0–34.0)
MCHC: 32.7 g/dL (ref 30.0–36.0)
MCV: 80 fL (ref 80.0–100.0)
Platelets: 267 10*3/uL (ref 150–400)
RBC: 5.69 MIL/uL (ref 4.22–5.81)
RDW: 12.8 % (ref 11.5–15.5)
WBC: 7.3 10*3/uL (ref 4.0–10.5)
nRBC: 0 % (ref 0.0–0.2)

## 2020-02-03 LAB — GLUCOSE, CAPILLARY
Glucose-Capillary: 72 mg/dL (ref 70–99)
Glucose-Capillary: 76 mg/dL (ref 70–99)
Glucose-Capillary: 46 mg/dL — ABNORMAL LOW (ref 70–99)
Glucose-Capillary: 62 mg/dL — ABNORMAL LOW (ref 70–99)
Glucose-Capillary: 72 mg/dL (ref 70–99)
Glucose-Capillary: 117 mg/dL — ABNORMAL HIGH (ref 70–99)

## 2020-02-03 MED ORDER — INSULIN GLARGINE 100 UNIT/ML ~~LOC~~ SOLN
20.0000 [IU] | Freq: Every day | SUBCUTANEOUS | Status: DC
  Administered 2020-02-04 – 2020-02-05 (×2): 20 [IU] via SUBCUTANEOUS
  Filled 2020-02-03 (×3): qty 0.2

## 2020-02-03 MED ORDER — MIRTAZAPINE 15 MG PO TBDP
15.0000 mg | ORAL_TABLET | Freq: Every day | ORAL | Status: DC
  Administered 2020-02-03 – 2020-02-04 (×2): 15 mg via ORAL
  Filled 2020-02-03 (×3): qty 1

## 2020-02-03 MED ORDER — INSULIN GLARGINE 100 UNIT/ML ~~LOC~~ SOLN: 28.0000 [IU] | Freq: Every day | SUBCUTANEOUS | Status: DC

## 2020-02-03 MED ORDER — METOPROLOL SUCCINATE ER 25 MG PO TB24
125.0000 mg | ORAL_TABLET | Freq: Every day | ORAL | Status: DC
  Administered 2020-02-04 – 2020-02-05 (×2): 125 mg via ORAL
  Filled 2020-02-03 (×2): qty 1

## 2020-02-03 NOTE — [facility] Group Notes (Signed)
[facility] Group Notes:  (Nursing/MHT/Case Management/Adjunct)  Date:  02/03/2020  Time:  5:29 PM  Type of Therapy:  Community Meeting  Participation Level:  Did Not Attend    Christian [name] 02/03/2020, 5:29 PM

## 2020-02-03 NOTE — Progress Notes (Signed)
[facility] MD Progress Note  02/03/2020 11:46 AM Christian [name]  MRN:  [id number] Subjective:  Patient is a 69 year old male with a past psychiatric history significant for bipolar disorder.  He was admitted on 01/31/2020 and was receiving ECT, but on the day prior to admission became so agitated and hostile that it was impossible to proceed with ECT.  He was admitted to the hospital for evaluation and and stabilization.  Objective: Patient is seen and examined.  Patient is a 69 year old male with the above-stated past psychiatric history who is seen in follow-up.  He is essentially unchanged from yesterday.  He does answer questions by nodding his head yes or no.  He really did not speak this morning.  His blood sugar was significantly low this morning.  It was 72.  I have gone on and stopped his Amaryl, and decreased his Lantus dosage.  He still remains essentially mute, and depressed.  He was started on Wellbutrin short acting yesterday 75 mg.  No evidence of worsening mania.  He did deny any auditory or visual hallucinations, and he did deny any suicidal ideation.  His p.o. intake has been poor, and his blood sugar at lunch was 76.  His CBC from 3/7 was essentially normal.  His liver function enzymes from 3/6 were normal.  His Depakote level was only 32.  His EKG did not show any atrial flutter, but did show sinus rhythm with a normal QTc interval.  His blood pressure remains mildly elevated at 146/101.  His blood sugar again was 72.  He only slept 4.75 hours last night.  As stated above, his sodium was slightly low at 134, but potassium was normal at 4.0.  His creatinine was normal at 0.96.  Principal Problem: Bipolar affective disorder, depressed, severe, with psychotic behavior (HCC) Diagnosis: Principal Problem:   Bipolar affective disorder, depressed, severe, with psychotic behavior (HCC) Active Problems:   Hypertension   Type 2 diabetes mellitus with complication, without long-term current use of  insulin (HCC)  Total Time spent with patient: 20 minutes  Past Psychiatric History: See admission H&P  Past Medical History:  Past Medical History:  Diagnosis Date  . Bilateral lower extremity edema: chronic with venous stasis changes 06/02/2014  . Bipolar 1 disorder (HCC)   . Bipolar disorder (HCC)   . Chronic kidney disease   . Gout   . Hx of blood clots   . Hypertension   . Mood swings   . OSA on CPAP 06/02/2014  . Other and unspecified hyperlipidemia 06/02/2014  . Type II or unspecified type diabetes mellitus with unspecified complication, uncontrolled 06/02/2014   History reviewed. No pertinent surgical history. Family History:  Family History  Problem Relation Age of Onset  . Dementia Mother   . Arthritis Father   . Hypertension Father   . Mental illness Sister   . Diabetes Sister   . Heart failure Neg Hx   . Kidney failure Neg Hx   . Cancer Neg Hx    Family Psychiatric  History: See admission H&P Social History:  Social History   Substance and Sexual Activity  Alcohol Use No     Social History   Substance and Sexual Activity  Drug Use No    Social History   Socioeconomic History  . Marital status: Divorced    Spouse name: Not on file  . Number of children: Not on file  . Years of education: Not on file  . Highest education level: Not on file  Occupational History  . Not on file  Tobacco Use  . Smoking status: Never Smoker  . Smokeless tobacco: Never Used  Substance and Sexual Activity  . Alcohol use: No  . Drug use: No  . Sexual activity: Not Currently  Other Topics Concern  . Not on file  Social History Narrative   *The patient has been married for over 30 years and is currently on disability. He lives with his wife in the [place] area and has one daughter.             Social Determinants of Health   Financial Resource Strain:   . Difficulty of Paying Living Expenses: Not on file  Food Insecurity:   . Worried About Programme researcher, broadcasting/film/video in the  Last Year: Not on file  . Ran Out of Food in the Last Year: Not on file  Transportation Needs:   . Lack of Transportation (Medical): Not on file  . Lack of Transportation (Non-Medical): Not on file  Physical Activity:   . Days of Exercise per Week: Not on file  . Minutes of Exercise per Session: Not on file  Stress:   . Feeling of Stress : Not on file  Social Connections:   . Frequency of Communication with Friends and Family: Not on file  . Frequency of Social Gatherings with Friends and Family: Not on file  . Attends Religious Services: Not on file  . Active Member of Clubs or Organizations: Not on file  . Attends Banker Meetings: Not on file  . Marital Status: Not on file   Additional Social History:                         Sleep: Fair  Appetite:  Poor  Current Medications: Current Facility-Administered Medications  Medication Dose Route Frequency Provider Last Rate Last Admin  . acetaminophen (TYLENOL) tablet 650 mg  650 mg Oral Q6H PRN Clapacs, John T, MD      . alum & mag hydroxide-simeth (MAALOX/MYLANTA) 200-200-20 MG/5ML suspension 30 mL  30 mL Oral Q4H PRN Clapacs, John T, MD      . amLODipine (NORVASC) tablet 10 mg  10 mg Oral Daily Clapacs, Jackquline Denmark, MD   10 mg at 02/03/20 0813  . ARIPiprazole (ABILIFY) tablet 20 mg  20 mg Oral Daily Clapacs, Jackquline Denmark, MD   20 mg at 02/03/20 0813  . aspirin tablet 325 mg  325 mg Oral Daily Clapacs, Jackquline Denmark, MD   325 mg at 02/03/20 0814  . atorvastatin (LIPITOR) tablet 10 mg  10 mg Oral q1800 Clapacs, Jackquline Denmark, MD   10 mg at 02/02/20 1627  . buPROPion [facility]) tablet 75 mg  75 mg Oral Daily Antonieta Pert, MD   75 mg at 02/03/20 2947  . clonazePAM (KLONOPIN) tablet 0.5 mg  0.5 mg Oral BID Clapacs, Jackquline Denmark, MD   0.5 mg at 02/03/20 0813  . divalproex (DEPAKOTE) DR tablet 500 mg  500 mg Oral Q12H Clapacs, John T, MD   500 mg at 02/03/20 6546  . furosemide (LASIX) tablet 20 mg  20 mg Oral Daily Clapacs, Jackquline Denmark, MD    20 mg at 02/03/20 0813  . insulin aspart (novoLOG) injection 0-15 Units  0-15 Units Subcutaneous TID WC Clapacs, Jackquline Denmark, MD   3 Units at 02/02/20 1050  . [START ON 02/04/2020] insulin glargine (LANTUS) injection 28 Units  28 Units Subcutaneous Daily Antonieta Pert,  MD      . liothyronine (CYTOMEL) tablet 25 mcg  25 mcg Oral Daily Clapacs, Jackquline Denmark, MD   25 mcg at 02/03/20 [phone]  . lisinopril (ZESTRIL) tablet 40 mg  40 mg Oral Daily Clapacs, Jackquline Denmark, MD   40 mg at 02/03/20 7564  . magnesium hydroxide (MILK OF MAGNESIA) suspension 30 mL  30 mL Oral Daily PRN Clapacs, John T, MD      . metFORMIN (GLUCOPHAGE-XR) 24 hr tablet 1,000 mg  1,000 mg Oral Q breakfast Clapacs, John T, MD   1,000 mg at 02/03/20 0815  . metoprolol succinate (TOPROL-XL) 24 hr tablet 100 mg  100 mg Oral Daily Clapacs, Jackquline Denmark, MD   100 mg at 02/03/20 0813  . rivaroxaban (XARELTO) tablet 20 mg  20 mg Oral Q supper Clapacs, Jackquline Denmark, MD   20 mg at 02/02/20 1627    Lab Results:  Results for orders placed or performed during the hospital encounter of 01/30/20 (from the past 48 hour(s))  Glucose, capillary     Status: Abnormal   Collection Time: 02/01/20  4:49 PM  Result Value Ref Range   Glucose-Capillary 40 (LL) 70 - 99 mg/dL    Comment: Glucose reference range applies only to samples taken after fasting for at least 8 hours.   Comment 1 Notify RN   Glucose, capillary     Status: Abnormal   Collection Time: 02/01/20  4:51 PM  Result Value Ref Range   Glucose-Capillary 47 (L) 70 - 99 mg/dL    Comment: Glucose reference range applies only to samples taken after fasting for at least 8 hours.  Glucose, capillary     Status: Abnormal   Collection Time: 02/01/20  5:21 PM  Result Value Ref Range   Glucose-Capillary 61 (L) 70 - 99 mg/dL    Comment: Glucose reference range applies only to samples taken after fasting for at least 8 hours.  Glucose, capillary     Status: Abnormal   Collection Time: 02/01/20  6:07 PM  Result Value Ref  Range   Glucose-Capillary 65 (L) 70 - 99 mg/dL    Comment: Glucose reference range applies only to samples taken after fasting for at least 8 hours.  Glucose, capillary     Status: None   Collection Time: 02/01/20  6:57 PM  Result Value Ref Range   Glucose-Capillary 76 70 - 99 mg/dL    Comment: Glucose reference range applies only to samples taken after fasting for at least 8 hours.  Glucose, random     Status: Abnormal   Collection Time: 02/02/20  9:29 AM  Result Value Ref Range   Glucose, Bld 187 (H) 70 - 99 mg/dL    Comment: Glucose reference range applies only to samples taken after fasting for at least 8 hours. Performed at [facility], [address] [place], [place] [address]   Comprehensive metabolic panel     Status: Abnormal   Collection Time: 02/02/20  9:29 AM  Result Value Ref Range   Sodium 134 (L) 135 - 145 mmol/L   Potassium 4.0 3.5 - 5.1 mmol/L   Chloride 94 (L) 98 - 111 mmol/L   CO2 27 22 - 32 mmol/L   Glucose, Bld 187 (H) 70 - 99 mg/dL    Comment: Glucose reference range applies only to samples taken after fasting for at least 8 hours.   BUN 20 8 - 23 mg/dL   Creatinine, Ser 1.88 0.61 - 1.24 mg/dL  Calcium 9.0 8.9 - 10.3 mg/dL   Total Protein 6.7 6.5 - 8.1 g/dL   Albumin 3.2 (L) 3.5 - 5.0 g/dL   AST 14 (L) 15 - 41 U/L   ALT 12 0 - 44 U/L   Alkaline Phosphatase 93 38 - 126 U/L   Total Bilirubin 0.6 0.3 - 1.2 mg/dL   GFR calc non Af Amer >60 >60 mL/min   GFR calc Af Amer >60 >60 mL/min   Anion gap 13 5 - 15    Comment: Performed at [facility], [address] [place], [place] [address]  Glucose, random     Status: Abnormal   Collection Time: 02/02/20 11:52 AM  Result Value Ref Range   Glucose, Bld 116 (H) 70 - 99 mg/dL    Comment: Glucose reference range applies only to samples taken after fasting for at least 8 hours. Performed at [facility] [place], [address] [place], [place] [address]   Glucose, random     Status: None    Collection Time: 02/02/20  5:23 PM  Result Value Ref Range   Glucose, Bld 78 70 - 99 mg/dL    Comment: Glucose reference range applies only to samples taken after fasting for at least 8 hours. Performed at [facility], [address] [place], [place] [address]   Glucose, capillary     Status: Abnormal   Collection Time: 02/02/20  8:47 PM  Result Value Ref Range   Glucose-Capillary 58 (L) 70 - 99 mg/dL    Comment: Glucose reference range applies only to samples taken after fasting for at least 8 hours.   Comment 1 Notify RN   Glucose, capillary     Status: None   Collection Time: 02/02/20 10:07 PM  Result Value Ref Range   Glucose-Capillary 82 70 - 99 mg/dL    Comment: Glucose reference range applies only to samples taken after fasting for at least 8 hours.   Comment 1 Repeat Test   CBC     Status: None   Collection Time: 02/03/20  6:36 AM  Result Value Ref Range   WBC 7.3 4.0 - 10.5 K/uL   RBC 5.69 4.22 - 5.81 MIL/uL   Hemoglobin 14.9 13.0 - 17.0 g/dL   HCT 47.0 96.2 - 83.6 %   MCV 80.0 80.0 - 100.0 fL   MCH 26.2 26.0 - 34.0 pg   MCHC 32.7 30.0 - 36.0 g/dL   RDW 62.9 47.6 - 54.6 %   Platelets 267 150 - 400 K/uL   nRBC 0.0 0.0 - 0.2 %    Comment: Performed at [facility], [address] [place], [place] [address]  Glucose, capillary     Status: None   Collection Time: 02/03/20  6:57 AM  Result Value Ref Range   Glucose-Capillary 72 70 - 99 mg/dL    Comment: Glucose reference range applies only to samples taken after fasting for at least 8 hours.   Comment 1 Notify RN   Glucose, capillary     Status: None   Collection Time: 02/03/20 11:29 AM  Result Value Ref Range   Glucose-Capillary 76 70 - 99 mg/dL    Comment: Glucose reference range applies only to samples taken after fasting for at least 8 hours.    Blood Alcohol level:  Lab Results  Component Value Date   [facility] <10 01/30/2020   ETH <10 10/31/2019    Metabolic Disorder Labs: Lab Results   Component Value Date  HGBA1C 7.1 (H) 01/30/2020   MPG 157.07 01/30/2020   MPG 186 (H) 07/02/2014   No results found for: PROLACTIN Lab Results  Component Value Date   CHOL 108 12/28/2015   TRIG 62 12/28/2015   HDL 33 (L) 12/28/2015   CHOLHDL 3.3 12/28/2015   VLDL 12 12/28/2015   LDLCALC 63 12/28/2015   LDLCALC  03/08/2011    53        Total Cholesterol/HDL:CHD Risk Coronary Heart Disease Risk Table                     Men   Women  1/2 Average Risk   3.4   3.3  Average Risk       5.0   4.4  2 X Average Risk   9.6   7.1  3 X Average Risk  23.4   11.0        Use the calculated Patient Ratio above and the CHD Risk Table to determine the patient's CHD Risk.        ATP III CLASSIFICATION (LDL):  <100     mg/dL   Optimal  250-539  mg/dL   Near or Above                    Optimal  130-159  mg/dL   Borderline  767-341  mg/dL   High  >937     mg/dL   Very High    Physical Findings: AIMS:  , ,  ,  ,    CIWA:    COWS:     Musculoskeletal: Strength & Muscle Tone: decreased Gait & Station: normal Patient leans: N/A  Psychiatric Specialty Exam: Physical Exam  Nursing note and vitals reviewed. Constitutional: He appears well-developed and well-nourished.  HENT:  Head: Normocephalic and atraumatic.  Respiratory: Effort normal.  Neurological: He is alert.    Review of Systems  Blood pressure (!) 146/101, pulse 96, temperature 98.4 F (36.9 C), temperature source Oral, resp. rate 18, height 6' (1.829 m), weight 117.5 kg, SpO2 95 %.Body mass index is 35.13 kg/m.  General Appearance: Disheveled  Eye Contact:  Fair  Speech:  Essentially mute  Volume:  Essentially mute  Mood:  Depressed and Dysphoric  Affect:  Congruent  Thought Process:  Goal Directed and Descriptions of Associations: Circumstantial  Orientation:  Negative  Thought Content:  Negative  Suicidal Thoughts:  No  Homicidal Thoughts:  No  Memory:  Immediate;   Poor Recent;   Poor Remote;   Poor   Judgement:  Impaired  Insight:  Fair  Psychomotor Activity:  Psychomotor Retardation  Concentration:  Concentration: Fair and Attention Span: Fair  Recall:  Poor  Fund of Knowledge:  Poor  Language:  Poor  Akathisia:  Negative  Handed:  Right  AIMS (if indicated):     Assets:  Desire for Improvement Resilience  ADL's:  Impaired  Cognition:  WNL  Sleep:  Number of Hours: 4.75     Treatment Plan Summary: Daily contact with patient to assess and evaluate symptoms and progress in treatment, Medication management and Plan : Patient is seen and examined.  Patient is a 70 year old male with the above-stated past psychiatric history who is seen in follow-up.   Diagnosis: #1 bipolar disorder, most recently depressed, severe with psychotic features, #2 hypertension, #3 type 2 diabetes mellitus, #4 reported history of chronic lower extremity edema, #5 history of hyperlipidemia, #6 history of blood clots on chronic anticoagulation  Patient is  seen in follow-up.  He is essentially unchanged from yesterday.  He remains selectively mute.  He remains significantly depressed.  His blood pressure is still mildly elevated, and his blood sugars are low.  Earlier today I decreased his Lantus insulin as well as stopping his Amaryl.  I am also going to stop the Metformin XL.  We will continue with Lantus and a sliding scale only at this point.  The oral hypoglycemics may be an issue with regard to his low blood sugar in the short run given his poor p.o. intake.  As stated yesterday he had been previously treated with Remeron, and I am going to restart that tonight.  That should help him sleep a little bit better, help his mood, and is well stimulate his appetite at least in the short run.  We will have to monitor for worsening manic symptoms if his flips him into a manic phase.  His sodium and potassium are normal, as well as his creatinine.  Because his blood pressure remains elevated, I had plan to increase his  Lasix, but given his poor p.o. intake I think that would just lead to dehydration and worsening issues.  I am going to increase his metoprolol to 125 mg.  Previously his cardiologist had done that, and I feel secure with that given his EKG which shows no evidence of atrial flutter at least at this point.  No other changes in his medicines at this point.  Hopefully the Remeron will stimulate his appetite and increase p.o. intake.  I will write for the staff to force fluids on him as well.  1.  Continue amlodipine 10 mg p.o. daily for hypertension. 2.  Continue Abilify 20 mg p.o. daily for psychosis and mood stability. 3.  Continue aspirin 325 mg p.o. daily for heart health. 4.  Continue Lipitor 10 mg p.o. every afternoon for hyperlipidemia. 5.  Continue Wellbutrin short acting 75 mg p.o. daily for depression. 6.  Continue clonazepam 0.5 mg p.o. twice daily for anxiety. 7.  Continue Depakote DR 500 mg p.o. every 12 hours.  This is for mood stability. 8.  Continue Lasix 20 mg p.o. daily for hypertension. 9.  Continue NovoLog sliding scale insulin for diabetes. 10.  Decrease Lantus insulin to 20 units subcu daily for diabetes mellitus. 11.  Continue Cytomel 25 mcg p.o. daily for depression augmentation. 12.  Continue lisinopril 40 mg p.o. daily for hypertension. 13.  Increase metoprolol XL to 125 mg p.o. daily for hypertension and rate control. 14.  Start mirtazapine 15 mg p.o. nightly for depression. 15.  Continue Xarelto 20 mg p.o. every afternoon for chronic anticoagulation. 16.  Hold metoprolol XR. 17.  Force fluids. 18.  Out of bed 3 times daily. 19.  Disposition planning-in progress.    Sharma Covert, MD 02/03/2020, 11:46 AM

## 2020-02-03 NOTE — [facility] Group Notes (Signed)
LCSW Group Therapy Note  02/03/2020 1:00PM  Type of Therapy/Topic:  Group Therapy:  Balance in Life  Participation Level:  Did Not Attend  Description of Group:    This group will address the concept of balance and how it feels and looks when one is unbalanced. Patients will be encouraged to process areas in their lives that are out of balance and identify reasons for remaining unbalanced. Facilitators will guide patients in utilizing problem-solving interventions to address and correct the stressor making their life unbalanced. Understanding and applying boundaries will be explored and addressed for obtaining and maintaining a balanced life. Patients will be encouraged to explore ways to assertively make their unbalanced needs known to significant others in their lives, using other group members and facilitator for support and feedback.  Therapeutic Goals: 1. Patient will identify two or more emotions or situations they have that consume much of in their lives. 2. Patient will identify signs/triggers that life has become out of balance:  3. Patient will identify two ways to set boundaries in order to achieve balance in their lives:  4. Patient will demonstrate ability to communicate their needs through discussion and/or role plays  Summary of Patient Progress:  X    Therapeutic Modalities:   Cognitive Behavioral Therapy Solution-Focused Therapy Assertiveness Training  Mont Rosaland Shiffman MSW, LCSWA Clinical Social Work 02/03/2020 11:56 AM   

## 2020-02-03 NOTE — Plan of Care (Signed)
Patient stated that his day was "uneventful." Patient does not make any conversation with staff.Stayed in  bed all day except for medicine and meals.Did not maintain personal hygiene even with encouragement.Compliant with medications.Denies SI,hi and AVH.Appetite good.No aggressive behaviors noted.

## 2020-02-04 ENCOUNTER — Inpatient Hospital Stay: Payer: Medicare PPO | Admitting: Certified Registered Nurse Anesthetist

## 2020-02-04 ENCOUNTER — Other Ambulatory Visit: Payer: Self-pay | Admitting: Psychiatry

## 2020-02-04 ENCOUNTER — Encounter: Payer: Self-pay | Admitting: Psychiatry

## 2020-02-04 LAB — GLUCOSE, CAPILLARY
Glucose-Capillary: 40 mg/dL — CL (ref 70–99)
Glucose-Capillary: 96 mg/dL (ref 70–99)
Glucose-Capillary: 100 mg/dL — ABNORMAL HIGH (ref 70–99)
Glucose-Capillary: 74 mg/dL (ref 70–99)
Glucose-Capillary: 135 mg/dL — ABNORMAL HIGH (ref 70–99)
Glucose-Capillary: 97 mg/dL (ref 70–99)

## 2020-02-04 MED ORDER — METOPROLOL TARTRATE 5 MG/5ML IV SOLN
INTRAVENOUS | Status: DC | PRN
  Administered 2020-02-04: 3 mg via INTRAVENOUS

## 2020-02-04 MED ORDER — ONDANSETRON HCL 4 MG/2ML IJ SOLN: 4.0000 mg | Freq: Once | INTRAMUSCULAR | Status: DC | PRN

## 2020-02-04 MED ORDER — HYDRALAZINE HCL 20 MG/ML IJ SOLN
INTRAMUSCULAR | Status: AC
  Filled 2020-02-04: qty 1

## 2020-02-04 MED ORDER — SODIUM CHLORIDE 0.9 % IV SOLN: INTRAVENOUS | Status: DC | PRN

## 2020-02-04 MED ORDER — METHOHEXITAL SODIUM 0.5 G IJ SOLR
INTRAMUSCULAR | Status: AC
Start: 2020-02-04 — End: ?
  Filled 2020-02-04: qty 500

## 2020-02-04 MED ORDER — METOPROLOL TARTRATE 5 MG/5ML IV SOLN
INTRAVENOUS | Status: DC | PRN
  Administered 2020-02-04: 2 mg via INTRAVENOUS
  Administered 2020-02-04: 3 mg via INTRAVENOUS

## 2020-02-04 MED ORDER — METOPROLOL TARTRATE 5 MG/5ML IV SOLN
INTRAVENOUS | Status: AC
  Filled 2020-02-04: qty 5

## 2020-02-04 MED ORDER — HYDRALAZINE HCL 20 MG/ML IJ SOLN
INTRAMUSCULAR | Status: DC | PRN
  Administered 2020-02-04: 10 mg via INTRAVENOUS

## 2020-02-04 MED ORDER — LABETALOL HCL 5 MG/ML IV SOLN
INTRAVENOUS | Status: AC
  Filled 2020-02-04: qty 4

## 2020-02-04 MED ORDER — LABETALOL HCL 5 MG/ML IV SOLN
INTRAVENOUS | Status: DC | PRN
  Administered 2020-02-04 (×2): 10 mg via INTRAVENOUS

## 2020-02-04 MED ORDER — SUCCINYLCHOLINE CHLORIDE 20 MG/ML IJ SOLN
INTRAMUSCULAR | Status: DC | PRN
  Administered 2020-02-04: 120 mg via INTRAVENOUS

## 2020-02-04 MED ORDER — FENTANYL CITRATE (PF) 100 MCG/2ML IJ SOLN: 25.0000 ug | INTRAMUSCULAR | Status: DC | PRN

## 2020-02-04 MED ORDER — SUCCINYLCHOLINE CHLORIDE 20 MG/ML IJ SOLN
INTRAMUSCULAR | Status: AC
  Filled 2020-02-04: qty 1

## 2020-02-04 MED ORDER — METHOHEXITAL SODIUM 100 MG/10ML IV SOSY
PREFILLED_SYRINGE | INTRAVENOUS | Status: DC | PRN
  Administered 2020-02-04: 100 mg via INTRAVENOUS

## 2020-02-04 NOTE — Plan of Care (Signed)
Patient does not show any motivation to do anything.Stayed in bed most of the shift.No personal hygiene maintained.Denies SI,HI and AVH.When asked about ECT,patient states "Dr.Clapacs said I did good."Appetite and energy level good.Compliant with medications.Support and encouragement given multiple times.

## 2020-02-04 NOTE — [facility] Group Notes (Signed)
[facility] Group Notes:  (Nursing/MHT/Case Management/Adjunct)  Date:  02/04/2020  Time:  11:46 AM  Type of Therapy:  Community Meeting  Participation Level:  Did Not Attend  Summary of Progress/Problems:  Christian [name] 02/04/2020, 11:46 AM

## 2020-02-04 NOTE — H&P (Signed)
Christian [name] is an 69 y.o. male.   Chief Complaint: still feeling bad, down and hopeless HPI: severe bipolar depression  Past Medical History:  Diagnosis Date  . Bilateral lower extremity edema: chronic with venous stasis changes 06/02/2014  . Bipolar 1 disorder ([place])   . Bipolar disorder ([place])   . Chronic kidney disease   . Gout   . Hx of blood clots   . Hypertension   . Mood swings   . OSA on CPAP 06/02/2014  . Other and unspecified hyperlipidemia 06/02/2014  . Type II or unspecified type diabetes mellitus with unspecified complication, uncontrolled 06/02/2014    History reviewed. No pertinent surgical history.  Family History  Problem Relation Age of Onset  . Dementia Mother   . Arthritis Father   . Hypertension Father   . Mental illness Sister   . Diabetes Sister   . Heart failure Neg Hx   . Kidney failure Neg Hx   . Cancer Neg Hx    Social History:  reports that he has never smoked. He has never used smokeless tobacco. He reports that he does not drink alcohol or use drugs.  Allergies:  Allergies  Allergen Reactions  . Zyprexa [Olanzapine] Other (See Comments)    Allergy noted by ACT Team. No reaction specified (??)    Medications Prior to Admission  Medication Sig Dispense Refill  . acetaminophen (TYLENOL) 325 MG tablet Take 2 tablets (650 mg total) by mouth every 6 (six) hours as needed for mild pain (or Fever >/= 101).    Marland Kitchen amLODipine (NORVASC) 5 MG tablet Take 5 mg by mouth daily.    Marland Kitchen atorvastatin (LIPITOR) 10 MG tablet Take 10 mg by mouth every evening.     . furosemide (LASIX) 20 MG tablet Take 1 tablet (20 mg total) by mouth daily. 30 tablet 0  . glimepiride (AMARYL) 2 MG tablet Take 2 mg by mouth daily with breakfast.    . liothyronine (CYTOMEL) 25 MCG tablet Take 25 mcg by mouth daily before breakfast.     . lisinopril (ZESTRIL) 20 MG tablet Take 20 mg by mouth daily with breakfast.    . metoprolol succinate (TOPROL-XL) 25 MG 24 hr tablet Take 25 mg by  mouth daily.     . QUEtiapine (SEROQUEL) 25 MG tablet Take 25 mg by mouth 2 (two) times daily. 0900,1500    . QUEtiapine (SEROQUEL) 300 MG tablet Take 300 mg by mouth at bedtime.    . rivaroxaban (XARELTO) 20 MG TABS tablet Take 1 tablet (20 mg total) by mouth daily with supper. (Patient taking differently: Take 20 mg by mouth daily. ) 30 tablet 1  . sitaGLIPtin (JANUVIA) 25 MG tablet Take 25 mg by mouth daily.      Results for orders placed or performed during the hospital encounter of 01/30/20 (from the past 48 hour(s))  Glucose, random     Status: Abnormal   Collection Time: 02/02/20 11:52 AM  Result Value Ref Range   Glucose, Bld 116 (H) 70 - 99 mg/dL    Comment: Glucose reference range applies only to samples taken after fasting for at least 8 hours. Performed at [facility], [address] [place], [place] [address]   Glucose, random     Status: None   Collection Time: 02/02/20  5:23 PM  Result Value Ref Range   Glucose, Bld 78 70 - 99 mg/dL    Comment: Glucose reference range applies only to samples taken after fasting  for at least 8 hours. Performed at [facility], [address] [place], [place] [address]   Glucose, capillary     Status: Abnormal   Collection Time: 02/02/20  8:47 PM  Result Value Ref Range   Glucose-Capillary 58 (L) 70 - 99 mg/dL    Comment: Glucose reference range applies only to samples taken after fasting for at least 8 hours.   Comment 1 Notify RN   Glucose, capillary     Status: None   Collection Time: 02/02/20 10:07 PM  Result Value Ref Range   Glucose-Capillary 82 70 - 99 mg/dL    Comment: Glucose reference range applies only to samples taken after fasting for at least 8 hours.   Comment 1 Repeat Test   CBC     Status: None   Collection Time: 02/03/20  6:36 AM  Result Value Ref Range   WBC 7.3 4.0 - 10.5 K/uL   RBC 5.69 4.22 - 5.81 MIL/uL   Hemoglobin 14.9 13.0 - 17.0 g/dL   HCT 33.8 25.0 - 53.9 %   MCV 80.0 80.0 -  100.0 fL   MCH 26.2 26.0 - 34.0 pg   MCHC 32.7 30.0 - 36.0 g/dL   RDW 76.7 34.1 - 93.7 %   Platelets 267 150 - 400 K/uL   nRBC 0.0 0.0 - 0.2 %    Comment: Performed at [facility], [address] [place], [place] [address]  Glucose, capillary     Status: None   Collection Time: 02/03/20  6:57 AM  Result Value Ref Range   Glucose-Capillary 72 70 - 99 mg/dL    Comment: Glucose reference range applies only to samples taken after fasting for at least 8 hours.   Comment 1 Notify RN   Glucose, capillary     Status: None   Collection Time: 02/03/20 11:29 AM  Result Value Ref Range   Glucose-Capillary 76 70 - 99 mg/dL    Comment: Glucose reference range applies only to samples taken after fasting for at least 8 hours.  Glucose, capillary     Status: Abnormal   Collection Time: 02/03/20  4:17 PM  Result Value Ref Range   Glucose-Capillary 46 (L) 70 - 99 mg/dL    Comment: Glucose reference range applies only to samples taken after fasting for at least 8 hours.   Comment 1 Notify RN   Glucose, capillary     Status: Abnormal   Collection Time: 02/03/20  4:43 PM  Result Value Ref Range   Glucose-Capillary 62 (L) 70 - 99 mg/dL    Comment: Glucose reference range applies only to samples taken after fasting for at least 8 hours.   Comment 1 Document in Chart   Glucose, capillary     Status: None   Collection Time: 02/03/20  5:17 PM  Result Value Ref Range   Glucose-Capillary 72 70 - 99 mg/dL    Comment: Glucose reference range applies only to samples taken after fasting for at least 8 hours.  Glucose, capillary     Status: Abnormal   Collection Time: 02/03/20  8:44 PM  Result Value Ref Range   Glucose-Capillary 117 (H) 70 - 99 mg/dL    Comment: Glucose reference range applies only to samples taken after fasting for at least 8 hours.   Comment 1 Notify RN   Glucose, capillary     Status: None   Collection Time: 02/04/20  6:53 AM  Result Value Ref Range   Glucose-Capillary 96 70  -  99 mg/dL    Comment: Glucose reference range applies only to samples taken after fasting for at least 8 hours.   Comment 1 Notify RN    No results found.  Review of Systems  Constitutional: Negative.   HENT: Negative.   Eyes: Negative.   Respiratory: Negative.   Cardiovascular: Negative.   Gastrointestinal: Negative.   Musculoskeletal: Negative.   Skin: Negative.   Neurological: Negative.   Psychiatric/Behavioral: Positive for confusion, dysphoric mood and sleep disturbance. The patient is nervous/anxious.     Blood pressure (!) 152/94, pulse 99, temperature 98.4 F (36.9 C), temperature source Oral, resp. rate 18, height 6' (1.829 m), weight 117.5 kg, SpO2 100 %. Physical Exam  Nursing note and vitals reviewed. Constitutional: He appears well-developed and well-nourished.  HENT:  Head: Normocephalic and atraumatic.  Eyes: Pupils are equal, round, and reactive to light. Conjunctivae are normal.  Cardiovascular: Regular rhythm and normal heart sounds.  Respiratory: Effort normal.  GI: Soft.  Musculoskeletal:        General: Normal range of motion.     Cervical back: Normal range of motion.  Neurological: He is alert.  Skin: Skin is warm and dry.  Psychiatric: Judgment normal. His affect is blunt. His speech is delayed. He is slowed. Thought content is paranoid. Cognition and memory are impaired. He expresses no suicidal ideation.     Assessment/Plan Patient agrees to ECT today and we will then asses for further treatment  [name], MD 02/04/2020, 9:48 AM

## 2020-02-04 NOTE — [facility] Group Notes (Signed)
[facility] Group Notes:  (Nursing/MHT/Case Management/Adjunct)  Date:  02/04/2020  Time:  10:47 PM  Type of Therapy:  Group Therapy  Participation Level:  None  Participation Quality:  Inattentive  Affect:  Lethargic  Cognitive:  sleepy  Insight:  None  Engagement in Group:  None  Modes of Intervention:  Discussion and Education  Summary of Progress/Problems:PT was inattentive in group. He dozed off multiple of times and was inactive but present in group  Marshel Golubski Carrington Clamp 02/04/2020, 10:47 PM

## 2020-02-04 NOTE — Anesthesia Procedure Notes (Signed)
Procedure Name: General with mask airway Date/Time: 02/04/2020 10:20 AM Performed by: Rosanne Gutting, CRNA Pre-anesthesia Checklist: Patient identified, Emergency Drugs available, Suction available and Patient being monitored Patient Re-evaluated:Patient Re-evaluated prior to induction Oxygen Delivery Method: Circle system utilized Preoxygenation: Pre-oxygenation with 100% oxygen Induction Type: IV induction Ventilation: Mask ventilation without difficulty, Mask ventilation throughout procedure and Oral airway inserted - appropriate to patient size Airway Equipment and Method: Bite block Placement Confirmation: positive ETCO2 Dental Injury: Teeth and Oropharynx as per pre-operative assessment

## 2020-02-04 NOTE — [facility] Group Notes (Signed)
[facility] Group Notes:  (Nursing/MHT/Case Management/Adjunct)  Date:  02/04/2020  Time:  10:29 PM  Type of Therapy:  Group Therapy  Participation Level:  None  Participation Quality:  Drowsy  Affect:  Lethargic  Cognitive:  sleepy  Insight:  None  Engagement in Group:  None  Modes of Intervention:  Education  Summary of Progress/Problems: PT was inattentive in group. He dozed off multiple of times and was inactive but present in group  Jillyan Plitt Carrington Clamp 02/04/2020, 10:29 PM

## 2020-02-04 NOTE — Plan of Care (Signed)
  Problem: Education: Goal: Knowledge of [facility] Education information/materials will improve Outcome: Progressing  D: Patient's mood is depressed. Affect is flat. Isolative to room. Denies SI, HI and AVH. Contracts for safety. Came out of his room for snack and to get medication. A: Continue to monitor for safety. R: safety maintained

## 2020-02-04 NOTE — Progress Notes (Signed)
[facility] MD Progress Note  02/04/2020 1:58 PM Christian [name]  MRN:  [id number] Subjective: Follow-up for this 69 year old man with a history of bipolar disorder.  Patient has been flat in his affect and mood.  Taking care of his most basic hygiene but otherwise not very interactive.  I came to see him this morning and recommended that we continue ECT.  After thinking about it the patient agreed that he would be cooperative.  He came for ECT this morning and had a treatment which was well-tolerated. Principal Problem: Bipolar affective disorder, depressed, severe, with psychotic behavior (HCC) Diagnosis: Principal Problem:   Bipolar affective disorder, depressed, severe, with psychotic behavior (HCC) Active Problems:   Hypertension   Type 2 diabetes mellitus with complication, without long-term current use of insulin (HCC)  Total Time spent with patient: 30 minutes  Past Psychiatric History: Long history of bipolar disorder with both psychotic depression and mania.  Worsening overall functioning recently  Past Medical History:  Past Medical History:  Diagnosis Date  . Bilateral lower extremity edema: chronic with venous stasis changes 06/02/2014  . Bipolar 1 disorder (HCC)   . Bipolar disorder (HCC)   . Chronic kidney disease   . Gout   . Hx of blood clots   . Hypertension   . Mood swings   . OSA on CPAP 06/02/2014  . Other and unspecified hyperlipidemia 06/02/2014  . Type II or unspecified type diabetes mellitus with unspecified complication, uncontrolled 06/02/2014   History reviewed. No pertinent surgical history. Family History:  Family History  Problem Relation Age of Onset  . Dementia Mother   . Arthritis Father   . Hypertension Father   . Mental illness Sister   . Diabetes Sister   . Heart failure Neg Hx   . Kidney failure Neg Hx   . Cancer Neg Hx    Family Psychiatric  History: See previous Social History:  Social History   Substance and Sexual Activity  Alcohol Use No      Social History   Substance and Sexual Activity  Drug Use No    Social History   Socioeconomic History  . Marital status: Divorced    Spouse name: Not on file  . Number of children: Not on file  . Years of education: Not on file  . Highest education level: Not on file  Occupational History  . Not on file  Tobacco Use  . Smoking status: Never Smoker  . Smokeless tobacco: Never Used  Substance and Sexual Activity  . Alcohol use: No  . Drug use: No  . Sexual activity: Not Currently  Other Topics Concern  . Not on file  Social History Narrative   *The patient has been married for over 30 years and is currently on disability. He lives with his wife in the [place] area and has one daughter.             Social Determinants of Health   Financial Resource Strain:   . Difficulty of Paying Living Expenses: Not on file  Food Insecurity:   . Worried About Programme researcher, broadcasting/film/video in the Last Year: Not on file  . Ran Out of Food in the Last Year: Not on file  Transportation Needs:   . Lack of Transportation (Medical): Not on file  . Lack of Transportation (Non-Medical): Not on file  Physical Activity:   . Days of Exercise per Week: Not on file  . Minutes of Exercise per Session: Not on file  Stress:   . Feeling of Stress : Not on file  Social Connections:   . Frequency of Communication with Friends and Family: Not on file  . Frequency of Social Gatherings with Friends and Family: Not on file  . Attends Religious Services: Not on file  . Active Member of Clubs or Organizations: Not on file  . Attends Banker Meetings: Not on file  . Marital Status: Not on file   Additional Social History:                         Sleep: Fair  Appetite:  Negative  Current Medications: Current Facility-Administered Medications  Medication Dose Route Frequency Provider Last Rate Last Admin  . acetaminophen (TYLENOL) tablet 650 mg  650 mg Oral Q6H PRN Clapacs, John T, MD       . alum & mag hydroxide-simeth (MAALOX/MYLANTA) 200-200-20 MG/5ML suspension 30 mL  30 mL Oral Q4H PRN Clapacs, John T, MD      . amLODipine (NORVASC) tablet 10 mg  10 mg Oral Daily Clapacs, Jackquline Denmark, MD   10 mg at 02/04/20 0753  . ARIPiprazole (ABILIFY) tablet 20 mg  20 mg Oral Daily Clapacs, Jackquline Denmark, MD   20 mg at 02/04/20 1203  . aspirin tablet 325 mg  325 mg Oral Daily Clapacs, Jackquline Denmark, MD   325 mg at 02/04/20 1206  . atorvastatin (LIPITOR) tablet 10 mg  10 mg Oral q1800 Clapacs, Jackquline Denmark, MD   10 mg at 02/03/20 1720  . buPROPion [facility]) tablet 75 mg  75 mg Oral Daily Antonieta Pert, MD   75 mg at 02/04/20 1206  . clonazePAM (KLONOPIN) tablet 0.5 mg  0.5 mg Oral BID Clapacs, Jackquline Denmark, MD   0.5 mg at 02/04/20 1203  . divalproex (DEPAKOTE) DR tablet 500 mg  500 mg Oral Q12H Clapacs, John T, MD   500 mg at 02/04/20 1204  . furosemide (LASIX) tablet 20 mg  20 mg Oral Daily Clapacs, Jackquline Denmark, MD   20 mg at 02/04/20 1207  . insulin aspart (novoLOG) injection 0-15 Units  0-15 Units Subcutaneous TID WC Clapacs, Jackquline Denmark, MD   3 Units at 02/02/20 1050  . insulin glargine (LANTUS) injection 20 Units  20 Units Subcutaneous Daily Antonieta Pert, MD   20 Units at 02/04/20 1207  . liothyronine (CYTOMEL) tablet 25 mcg  25 mcg Oral Daily Clapacs, Jackquline Denmark, MD   25 mcg at 02/04/20 1206  . lisinopril (ZESTRIL) tablet 40 mg  40 mg Oral Daily Clapacs, Jackquline Denmark, MD   40 mg at 02/04/20 0753  . magnesium hydroxide (MILK OF MAGNESIA) suspension 30 mL  30 mL Oral Daily PRN Clapacs, John T, MD      . metoprolol succinate (TOPROL-XL) 24 hr tablet 125 mg  125 mg Oral Daily Antonieta Pert, MD   125 mg at 02/04/20 0752  . mirtazapine (REMERON SOL-TAB) disintegrating tablet 15 mg  15 mg Oral QHS Antonieta Pert, MD   15 mg at 02/03/20 2115  . rivaroxaban (XARELTO) tablet 20 mg  20 mg Oral Q supper Clapacs, Jackquline Denmark, MD   20 mg at 02/03/20 1719    Lab Results:  Results for orders placed or performed during the  hospital encounter of 01/30/20 (from the past 48 hour(s))  Glucose, random     Status: None   Collection Time: 02/02/20  5:23 PM  Result Value Ref Range  Glucose, Bld 78 70 - 99 mg/dL    Comment: Glucose reference range applies only to samples taken after fasting for at least 8 hours. Performed at [facility], [address] [place], [place] [address]   Glucose, capillary     Status: Abnormal   Collection Time: 02/02/20  8:47 PM  Result Value Ref Range   Glucose-Capillary 58 (L) 70 - 99 mg/dL    Comment: Glucose reference range applies only to samples taken after fasting for at least 8 hours.   Comment 1 Notify RN   Glucose, capillary     Status: None   Collection Time: 02/02/20 10:07 PM  Result Value Ref Range   Glucose-Capillary 82 70 - 99 mg/dL    Comment: Glucose reference range applies only to samples taken after fasting for at least 8 hours.   Comment 1 Repeat Test   CBC     Status: None   Collection Time: 02/03/20  6:36 AM  Result Value Ref Range   WBC 7.3 4.0 - 10.5 K/uL   RBC 5.69 4.22 - 5.81 MIL/uL   Hemoglobin 14.9 13.0 - 17.0 g/dL   HCT 45.5 39.0 - 52.0 %   MCV 80.0 80.0 - 100.0 fL   MCH 26.2 26.0 - 34.0 pg   MCHC 32.7 30.0 - 36.0 g/dL   RDW 12.8 11.5 - 15.5 %   Platelets 267 150 - 400 K/uL   nRBC 0.0 0.0 - 0.2 %    Comment: Performed at [facility], [address] [place], [place] [address]  Glucose, capillary     Status: None   Collection Time: 02/03/20  6:57 AM  Result Value Ref Range   Glucose-Capillary 72 70 - 99 mg/dL    Comment: Glucose reference range applies only to samples taken after fasting for at least 8 hours.   Comment 1 Notify RN   Glucose, capillary     Status: None   Collection Time: 02/03/20 11:29 AM  Result Value Ref Range   Glucose-Capillary 76 70 - 99 mg/dL    Comment: Glucose reference range applies only to samples taken after fasting for at least 8 hours.  Glucose, capillary     Status: Abnormal   Collection  Time: 02/03/20  4:17 PM  Result Value Ref Range   Glucose-Capillary 46 (L) 70 - 99 mg/dL    Comment: Glucose reference range applies only to samples taken after fasting for at least 8 hours.   Comment 1 Notify RN   Glucose, capillary     Status: Abnormal   Collection Time: 02/03/20  4:43 PM  Result Value Ref Range   Glucose-Capillary 62 (L) 70 - 99 mg/dL    Comment: Glucose reference range applies only to samples taken after fasting for at least 8 hours.   Comment 1 Document in Chart   Glucose, capillary     Status: None   Collection Time: 02/03/20  5:17 PM  Result Value Ref Range   Glucose-Capillary 72 70 - 99 mg/dL    Comment: Glucose reference range applies only to samples taken after fasting for at least 8 hours.  Glucose, capillary     Status: Abnormal   Collection Time: 02/03/20  8:44 PM  Result Value Ref Range   Glucose-Capillary 117 (H) 70 - 99 mg/dL    Comment: Glucose reference range applies only to samples taken after fasting for at least 8 hours.   Comment 1 Notify RN   Glucose, capillary  Status: None   Collection Time: 02/04/20  6:53 AM  Result Value Ref Range   Glucose-Capillary 96 70 - 99 mg/dL    Comment: Glucose reference range applies only to samples taken after fasting for at least 8 hours.   Comment 1 Notify RN   Glucose, capillary     Status: None   Collection Time: 02/04/20 10:46 AM  Result Value Ref Range   Glucose-Capillary 74 70 - 99 mg/dL    Comment: Glucose reference range applies only to samples taken after fasting for at least 8 hours.  Glucose, capillary     Status: Abnormal   Collection Time: 02/04/20 11:16 AM  Result Value Ref Range   Glucose-Capillary 100 (H) 70 - 99 mg/dL    Comment: Glucose reference range applies only to samples taken after fasting for at least 8 hours.    Blood Alcohol level:  Lab Results  Component Value Date   ETH <10 01/30/2020   ETH <10 10/31/2019    Metabolic Disorder Labs: Lab Results  Component Value  Date   HGBA1C 7.1 (H) 01/30/2020   MPG 157.07 01/30/2020   MPG 186 (H) 07/02/2014   No results found for: PROLACTIN Lab Results  Component Value Date   CHOL 108 12/28/2015   TRIG 62 12/28/2015   HDL 33 (L) 12/28/2015   CHOLHDL 3.3 12/28/2015   VLDL 12 12/28/2015   LDLCALC 63 12/28/2015   LDLCALC  03/08/2011    53        Total Cholesterol/HDL:CHD Risk Coronary Heart Disease Risk Table                     Men   Women  1/2 Average Risk   3.4   3.3  Average Risk       5.0   4.4  2 X Average Risk   9.6   7.1  3 X Average Risk  23.4   11.0        Use the calculated Patient Ratio above and the CHD Risk Table to determine the patient's CHD Risk.        ATP III CLASSIFICATION (LDL):  <100     mg/dL   Optimal  937-902  mg/dL   Near or Above                    Optimal  130-159  mg/dL   Borderline  409-735  mg/dL   High  >329     mg/dL   Very High    Physical Findings: AIMS:  , ,  ,  ,    CIWA:    COWS:     Musculoskeletal: Strength & Muscle Tone: within normal limits Gait & Station: normal Patient leans: N/A  Psychiatric Specialty Exam: Physical Exam  Nursing note and vitals reviewed. Constitutional: He appears well-developed and well-nourished.  HENT:  Head: Normocephalic and atraumatic.  Eyes: Pupils are equal, round, and reactive to light. Conjunctivae are normal.  Cardiovascular: Regular rhythm and normal heart sounds.  Respiratory: Effort normal.  GI: Soft.  Musculoskeletal:        General: Normal range of motion.     Cervical back: Normal range of motion.  Neurological: He is alert.  Skin: Skin is warm and dry.  Psychiatric: Judgment normal. His affect is blunt. His speech is delayed and tangential. He is slowed. Thought content is paranoid. Cognition and memory are impaired. He expresses no homicidal and no suicidal ideation.  Review of Systems  Constitutional: Negative.   HENT: Negative.   Eyes: Negative.   Respiratory: Negative.   Cardiovascular:  Negative.   Gastrointestinal: Negative.   Musculoskeletal: Negative.   Skin: Negative.   Neurological: Negative.   Psychiatric/Behavioral: The patient is nervous/anxious.     Blood pressure (!) 156/97, pulse 97, temperature (!) 97.3 F (36.3 C), resp. rate 19, height 6' (1.829 m), weight 117.5 kg, SpO2 97 %.Body mass index is 35.13 kg/m.  General Appearance: Casual  Eye Contact:  Fair  Speech:  Slow  Volume:  Decreased  Mood:  Anxious and Depressed  Affect:  Constricted  Thought Process:  Disorganized  Orientation:  Full (Time, Place, and Person)  Thought Content:  Illogical, Paranoid Ideation and Rumination  Suicidal Thoughts:  No  Homicidal Thoughts:  No  Memory:  Immediate;   Fair Recent;   Poor Remote;   Fair  Judgement:  Fair  Insight:  Fair  Psychomotor Activity:  Decreased  Concentration:  Concentration: Fair  Recall:  [employer] of Knowledge:  Fair  Language:  Fair  Akathisia:  No  Handed:  Right  AIMS (if indicated):     Assets:  Desire for Improvement  ADL's:  Intact  Cognition:  WNL  Sleep:  Number of Hours: 8     Treatment Plan Summary: Daily contact with patient to assess and evaluate symptoms and progress in treatment, Medication management and Plan Patient not necessarily much worse or better.  Taking care of himself adequately.  Not suicidal.  He had ECT today.  I think I am going to talk with his wife and daughter and suggest that we plan for discharge tomorrow.  His blood pressure is still running high as his been a chronic problem.  No change however for medicine at this point.  If possible we will see if we can get him back in for ECT and no more than a week.  [name], MD 02/04/2020, 1:58 PM

## 2020-02-04 NOTE — Progress Notes (Signed)
D: Patient's mood is depressed. Affect is flat. Isolative to room. Denies SI, HI and AVH. Contracts for safety. Came out of his room for snack and to get medication. A: Continue to monitor for safety. R: safety maintained

## 2020-02-04 NOTE — Procedures (Signed)
ECT SERVICES Physician's Interval Evaluation & Treatment Note  Patient Identification: Christian [name] MRN:  [id number] Date of Evaluation:  02/04/2020 TX #: 4  MADRS:   MMSE:   P.E. Findings:  No acute physical exam  Psychiatric Interval Note:  Flat and withdrawn all weekend  Subjective:  Patient is a 69 y.o. male seen for evaluation for Electroconvulsive Therapy. "Not good"  Treatment Summary:   [x]   Right Unilateral             []  Bilateral   % Energy : 0.3 ms 100%   Impedance: 1050 ohms  Seizure Energy Index: 1613 V squared  Postictal Suppression Index: 63%  Seizure Concordance Index: 62%  Medications  Pre Shock: Labetalol 20 mg Brevital 100 mg succinylcholine 150 mg  Post Shock:    Seizure Duration: 15 seconds EMG 42 seconds EEG   Comments: We are going to try and see if we can negotiate more later this week  Lungs:  [x]   Clear to auscultation               []  Other:   Heart:    [x]   Regular rhythm             []  irregular rhythm    [x]   Previous H&P reviewed, patient examined and there are NO CHANGES                 []   Previous H&P reviewed, patient examined and there are changes noted.   , MD 3/8/202110:17 AM

## 2020-02-04 NOTE — Anesthesia Preprocedure Evaluation (Addendum)
Anesthesia Evaluation  Patient identified by MRN, date of birth, ID band Patient awake    Reviewed: Allergy & Precautions, H&P , NPO status , Patient's Chart, lab work & pertinent test results  History of Anesthesia Complications Negative for: history of anesthetic complications  Airway Mallampati: IV  TM Distance: >3 FB Neck ROM: Full   Comment: Poor effort at mouth opening Dental no notable dental hx. (+) Edentulous Upper   Pulmonary shortness of breath, sleep apnea and Continuous Positive Airway Pressure Ventilation ,  H/o pulmonary embolism   Pulmonary exam normal breath sounds clear to auscultation       Cardiovascular hypertension, On Medications + dysrhythmias Atrial Fibrillation  Rhythm:Irregular Rate:Normal - Systolic murmurs    Neuro/Psych PSYCHIATRIC DISORDERS Depression Bipolar Disorder Schizophrenia negative neurological ROS     GI/Hepatic negative GI ROS, Neg liver ROS,   Endo/Other  diabetes, Type 2, Insulin Dependent, Oral Hypoglycemic Agents  Renal/GU Renal InsufficiencyRenal disease  negative genitourinary   Musculoskeletal   Abdominal   Peds  Hematology negative hematology ROS (+)   Anesthesia Other Findings Past Medical History: 06/02/2014: Bilateral lower extremity edema: chronic with venous stasis  changes No date: Bipolar 1 disorder (HCC) No date: Bipolar disorder (HCC) No date: Chronic kidney disease No date: Gout No date: Hx of blood clots No date: Hypertension No date: Mood swings 06/02/2014: OSA on CPAP 06/02/2014: Other and unspecified hyperlipidemia 06/02/2014: Type II or unspecified type diabetes mellitus with  unspecified complication, uncontrolled  No past surgical history on file.     Reproductive/Obstetrics negative OB ROS                            Anesthesia Physical  Anesthesia Plan  ASA: III  Anesthesia Plan: General   Post-op Pain  Management:    Induction: Intravenous  PONV Risk Score and Plan: 2 and Ondansetron and TIVA  Airway Management Planned: Mask  Additional Equipment: None  Intra-op Plan:   Post-operative Plan:   Informed Consent: I have reviewed the patients History and Physical, chart, labs and discussed the procedure including the risks, benefits and alternatives for the proposed anesthesia with the patient or authorized representative who has indicated his/her understanding and acceptance.     Dental Advisory Given  Plan Discussed with: CRNA and Surgeon  Anesthesia Plan Comments: (Discussed risks of anesthesia with patient, including PONV, muscle aches. Rare risks discussed as well, such as cardiorespiratory sequelae, need for airway intervention. Patient understands.)        Anesthesia Quick Evaluation

## 2020-02-04 NOTE — Progress Notes (Signed)
Inpatient Diabetes Program Recommendations  AACE/ADA: New Consensus Statement on Inpatient Glycemic Control (2015)  Target Ranges:  Prepandial:   less than 140 mg/dL      Peak postprandial:   less than 180 mg/dL (1-2 hours)      Critically ill patients:  140 - 180 mg/dL   Results for Christian [name], Christian [name] (MRN [id number]) as of 02/04/2020 10:14  Ref. Range 02/03/2020 06:57 02/03/2020 11:29 02/03/2020 16:17 02/03/2020 16:43 02/03/2020 17:17 02/03/2020 20:44  Glucose-Capillary Latest Ref Range: 70 - 99 mg/dL 72  34 units LANTUS given at 8:12am 76 46 (L) 62 (L) 72 117 (H)   Results for Christian [name], Christian [name] (MRN [id number]) as of 02/04/2020 10:14  Ref. Range 02/04/2020 06:53  Glucose-Capillary Latest Ref Range: 70 - 99 mg/dL 96    Admit with: Severe bipolar depression  History: DM, CKD  Home DM Meds: Amaryl 2 mg Daily       Januvia 25 mg Daily  Current Orders: Lantus 20 units daily      Novolog Moderate Correction Scale/ SSI (0-15 units) TID AC       Note patient with Severe HYPO event yesterday at 4pm.  Note Amaryl and Metformin discontinued and Lantus reduced to 20 units Daily this AM.  Agree with adjustments made.    --Will follow patient during hospitalization--  Ambrose Finland RN, MSN, CDE Diabetes Coordinator Inpatient Glycemic Control Team Team Pager: [phone] (8a-5p)

## 2020-02-04 NOTE — Transfer of Care (Signed)
Immediate Anesthesia Transfer of Care Note  Patient: Christian [name]  Procedure(s) Performed: ECT TX  Patient Location: PACU  Anesthesia Type:General  Level of Consciousness: awake, alert  and oriented  Airway & Oxygen Therapy: Patient Spontanous Breathing and Patient connected to face mask oxygen  Post-op Assessment: Report given to RN and Post -op Vital signs reviewed and stable  Post vital signs: Reviewed and stable  Last Vitals:  Vitals Value Taken Time  BP 191/113 02/04/20 1038  Temp 36.3 C 02/04/20 1038  Pulse 99 02/04/20 1039  Resp 14 02/04/20 1039  SpO2 96 % 02/04/20 1039  Vitals shown include unvalidated device data.  Last Pain:  Vitals:   02/04/20 1024  TempSrc:   PainSc: 0-No pain         Complications: No apparent anesthesia complications

## 2020-02-04 NOTE — [facility] Group Notes (Signed)
LCSW Group Therapy Note   02/04/2020 12:27 PM   Type of Therapy and Topic:  Group Therapy:  Overcoming Obstacles   Participation Level:  Did Not Attend   Description of Group:    In this group patients will be encouraged to explore what they see as obstacles to their own wellness and recovery. They will be guided to discuss their thoughts, feelings, and behaviors related to these obstacles. The group will process together ways to cope with barriers, with attention given to specific choices patients can make. Each patient will be challenged to identify changes they are motivated to make in order to overcome their obstacles. This group will be process-oriented, with patients participating in exploration of their own experiences as well as giving and receiving support and challenge from other group members.   Therapeutic Goals: 1. Patient will identify personal and current obstacles as they relate to admission. 2. Patient will identify barriers that currently interfere with their wellness or overcoming obstacles.  3. Patient will identify feelings, thought process and behaviors related to these barriers. 4. Patient will identify two changes they are willing to make to overcome these obstacles:      Summary of Patient Progress x     Therapeutic Modalities:   Cognitive Behavioral Therapy Solution Focused Therapy Motivational Interviewing Relapse Prevention Therapy  Iris Pert, MSW, LCSW Clinical Social Work 02/04/2020 12:27 PM

## 2020-02-04 NOTE — Anesthesia Postprocedure Evaluation (Signed)
Anesthesia Post Note  Patient: Christian [name]  Procedure(s) Performed: ECT TX  Patient location during evaluation: PACU Anesthesia Type: General Level of consciousness: awake and alert and oriented Pain management: pain level controlled Vital Signs Assessment: post-procedure vital signs reviewed and stable Respiratory status: spontaneous breathing Cardiovascular status: blood pressure returned to baseline Anesthetic complications: no     Last Vitals:  Vitals:   02/04/20 1108 02/04/20 1118  BP: (!) 169/91 (!) 156/97  Pulse: 98 97  Resp: (!) 23 19  Temp:    SpO2: 97% 97%    Last Pain:  Vitals:   02/04/20 1118  TempSrc:   PainSc: 0-No pain                 [name]

## 2020-02-05 LAB — GLUCOSE, CAPILLARY
Glucose-Capillary: 101 mg/dL — ABNORMAL HIGH (ref 70–99)
Glucose-Capillary: 138 mg/dL — ABNORMAL HIGH (ref 70–99)

## 2020-02-05 MED ORDER — BUPROPION HCL 75 MG PO TABS: 75.0000 mg | ORAL_TABLET | Freq: Every day | ORAL | 0 refills | Status: DC

## 2020-02-05 MED ORDER — LISINOPRIL 40 MG PO TABS: 40.0000 mg | ORAL_TABLET | Freq: Every day | ORAL | 0 refills | Status: DC

## 2020-02-05 MED ORDER — ATORVASTATIN CALCIUM 10 MG PO TABS: 10.0000 mg | ORAL_TABLET | Freq: Every day | ORAL | 0 refills | Status: AC

## 2020-02-05 MED ORDER — ARIPIPRAZOLE 20 MG PO TABS: 20.0000 mg | ORAL_TABLET | Freq: Every day | ORAL | 0 refills | Status: DC

## 2020-02-05 MED ORDER — DIVALPROEX SODIUM 500 MG PO DR TAB: 500.0000 mg | DELAYED_RELEASE_TABLET | Freq: Two times a day (BID) | ORAL | 0 refills | Status: DC

## 2020-02-05 MED ORDER — AMLODIPINE BESYLATE 10 MG PO TABS: 10.0000 mg | ORAL_TABLET | Freq: Every day | ORAL | 0 refills | Status: DC

## 2020-02-05 MED ORDER — MIRTAZAPINE 15 MG PO TBDP: 15.0000 mg | ORAL_TABLET | Freq: Every day | ORAL | 0 refills | Status: DC

## 2020-02-05 NOTE — [facility] Group Notes (Signed)
[facility] Group Notes:  (Nursing/MHT/Case Management/Adjunct)  Date:  02/05/2020  Time:  2:59 PM  Type of Therapy:  Psychoeducational Skills  Participation Level:  Did Not Attend   Lynelle Smoke [facility] 02/05/2020, 2:59 PM

## 2020-02-05 NOTE — Plan of Care (Signed)
  Problem: Coping: Goal: Ability to verbalize frustrations and anger appropriately will improve Outcome: Progressing Goal: Ability to demonstrate self-control will improve Outcome: Progressing  D: Patient remains very flat and sad. Isolates to room for the most part. Denies SI, HI and AVH. Came out of room for snack and stayed in the dayroom for a brief period afterwards. Not interacting with patients or staff very much. A: Continue to monitor for safety R: Safety maintained.

## 2020-02-05 NOTE — [facility] Suicide Risk Assessment (Signed)
[facility] Discharge Suicide Risk Assessment   Principal Problem: Bipolar affective disorder, depressed, severe, with psychotic behavior (HCC) Discharge Diagnoses: Principal Problem:   Bipolar affective disorder, depressed, severe, with psychotic behavior (HCC) Active Problems:   Hypertension   Type 2 diabetes mellitus with complication, without long-term current use of insulin (HCC)   Total Time spent with patient: 30 minutes  Musculoskeletal: Strength & Muscle Tone: within normal limits Gait & Station: normal Patient leans: N/A  Psychiatric Specialty Exam: Review of Systems  Constitutional: Negative.   HENT: Negative.   Eyes: Negative.   Respiratory: Negative.   Cardiovascular: Negative.   Gastrointestinal: Negative.   Musculoskeletal: Negative.   Skin: Negative.   Neurological: Negative.   Psychiatric/Behavioral: Positive for dysphoric mood. The patient is nervous/anxious.     Blood pressure (!) 155/98, pulse 86, temperature 98.4 F (36.9 C), temperature source Oral, resp. rate 18, height 6' (1.829 m), weight 117.5 kg, SpO2 100 %.Body mass index is 35.13 kg/m.  General Appearance: Casual  Eye Contact::  Fair  Speech:  Slow409  Volume:  Decreased  Mood:  Dysphoric  Affect:  Congruent  Thought Process:  Coherent  Orientation:  Full (Time, Place, and Person)  Thought Content:  Illogical and Rumination  Suicidal Thoughts:  No  Homicidal Thoughts:  No  Memory:  Immediate;   Fair Recent;   Poor Remote;   Fair  Judgement:  Fair  Insight:  Fair  Psychomotor Activity:  Decreased  Concentration:  Fair  Recall:  [employer] of Knowledge:Fair  Language: Fair  Akathisia:  No  Handed:  Right  AIMS (if indicated):     Assets:  Desire for Improvement Financial Resources/Insurance Housing Physical Health Resilience  Sleep:  Number of Hours: 7.75  Cognition: Impaired,  Mild  ADL's:  Impaired   Mental Status Per Nursing Assessment::   On Admission:  NA  Demographic  Factors:  Male, Age 29 or older and Living alone  Loss Factors: Decline in physical health  Historical Factors: NA  Risk Reduction Factors:   Sense of responsibility to family, Religious beliefs about death, Positive social support and Positive therapeutic relationship  Continued Clinical Symptoms:  Bipolar Disorder:   Depressive phase Depression:   Anhedonia  Cognitive Features That Contribute To Risk:  None    Suicide Risk:  Minimal: No identifiable suicidal ideation.  Patients presenting with no risk factors but with morbid ruminations; may be classified as minimal risk based on the severity of the depressive symptoms  Follow-up Information    [facility] Follow up.   Why: Your ACT team will see you 02/06/20 at your home. Thank You! Contact information: [address] [place] [address] [phone]           Plan Of Care/Follow-up recommendations:  Activity:  Activity as tolerated Diet:  Regular diet or diabetic as tolerated Other:  Follow-up outpatient treatment including we will work with him on managing ECT treatment if possible  [name], MD 02/05/2020, 11:56 AM

## 2020-02-05 NOTE — [facility] Group Notes (Signed)
[facility] Group Notes:  (Nursing/MHT/Case Management/Adjunct)  Date:  02/05/2020  Time: 8:30AM Type of Therapy:  Community meeting  Participation Level:  Did Not Attend    Hulda Marin 02/05/2020, 9:32 AM

## 2020-02-05 NOTE — Plan of Care (Signed)
  Problem: Group Participation Goal: STG - Patient will engage in groups without prompting or encouragement from LRT x3 group sessions within 5 recreation therapy group sessions Description: STG - Patient will engage in groups without prompting or encouragement from LRT x3 group sessions within 5 recreation therapy group sessions 02/05/2020 1442 by Ernest Haber, LRT Outcome: Not Applicable 02/27/9913 [id number] by Ernest Haber, LRT Outcome: Not Met (add Reason) Note: Patient did not attend any groups.

## 2020-02-05 NOTE — Progress Notes (Signed)
Recreation Therapy Notes   Date: 02/05/2020  Time: 9:30 am   Location: Craft room   Behavioral response: N/A   Intervention Topic: Goals   Discussion/Intervention: Patient did not attend group.   Clinical Observations/Feedback:  Patient did not attend group.   Jamelyn Bovard LRT/CTRS        Kylen Schliep 02/05/2020 12:33 PM

## 2020-02-05 NOTE — Discharge Summary (Addendum)
Physician Discharge Summary Note  Patient:  Christian [name] is an 69 y.o., male MRN:  [id number] DOB:  July 20, 1951 Patient phone:  [phone] (home)  Patient address:   [address] [place] [address],  Total Time spent with patient: 45 minutes  Date of Admission:  01/30/2020 Date of Discharge: 02/05/2020  Reason for Admission:Patient seen and chart reviewed.  Patient well-known from previous encounters.  69 year old man with bipolar disorder.  He was receiving maintenance ECT from our service but yesterday his behavior became so agitated and hostile that it was impossible to proceed with ECT.  Family reports that for the past few weeks they have seen a decline in his functioning.  He is less active during the day.  Mood seems more confused and he frequently is saying more paranoid and confused things.  He has trouble keeping track of his medicine and is not feeding himself well.  Patient had previously had a reported good response to ECT but became physically agitated to the point of violence yesterday.  On interview today he says he does not like the ECT.  He does not like the way it makes him feel when he is getting the anesthetic and does not like the way he feels when he wakes up.  He is ambivalent as to whether he thinks it was ever helpful.  Patient is currently denying suicidal or homicidal ideation.  Denies current hallucinations although frequently seems to be easily restrict distracted and responding to internal stimuli.  Patient admits his mood has been bad and that things are getting worse for him at home.  His blood pressure has been poorly controlled from what I can tell. Associated Signs/Symptoms: Depression Symptoms:  psychomotor retardation, fatigue, difficulty concentrating, hopelessness, recurrent thoughts of death, loss of energy/fatigue, disturbed sleep, (Hypo) Manic Symptoms:  none Anxiety Symptoms:  Excessive Worry, Psychotic Symptoms:  Ideas of  Reference, Paranoia, PTSD Symptoms: Negative   Past Psychiatric History: Patient has a history of bipolar disorder which has progressively gotten worse particularly over the past several years.  He is now at a point where he is almost constantly symptomatic either depressed or manic.  Additionally he seems to be having more cognitive impairment at baseline.  Previously ECT had been of some help although there were also incidents recently when it had seem to have lost its effectiveness.  He has had multiple lengthy hospitalizations in his life and was just in [facility] not long ago.  No actual history of suicide attempts I know of.     Principal Problem: Bipolar affective disorder, depressed, severe, with psychotic behavior (Christian [name]) Discharge Diagnoses: Principal Problem:   Bipolar affective disorder, depressed, severe, with psychotic behavior (Christian [name]) Active Problems:   Hypertension   Type 2 diabetes mellitus with complication, without long-term current use of insulin (Christian [name])  Past Medical History:  Past Medical History:  Diagnosis Date  . Bilateral lower extremity edema: chronic with venous stasis changes 06/02/2014  . Bipolar 1 disorder ([place])   . Bipolar disorder ([place])   . Chronic kidney disease   . Gout   . Hx of blood clots   . Hypertension   . Mood swings   . OSA on CPAP 06/02/2014  . Other and unspecified hyperlipidemia 06/02/2014  . Type II or unspecified type diabetes mellitus with unspecified complication, uncontrolled 06/02/2014   History reviewed. No pertinent surgical history. Family History:  Family History  Problem Relation Age of Onset  . Dementia Mother   .  Arthritis Father   . Hypertension Father   . Mental illness Sister   . Diabetes Sister   . Heart failure Neg Hx   . Kidney failure Neg Hx   . Cancer Neg Hx    Family Psychiatric  History: Not reported Social History:  Social History   Substance and Sexual Activity  Alcohol Use No     Social History    Substance and Sexual Activity  Drug Use No    Social History   Socioeconomic History  . Marital status: Divorced    Spouse name: Not on file  . Number of children: Not on file  . Years of education: Not on file  . Highest education level: Not on file  Occupational History  . Not on file  Tobacco Use  . Smoking status: Never Smoker  . Smokeless tobacco: Never Used  Substance and Sexual Activity  . Alcohol use: No  . Drug use: No  . Sexual activity: Not Currently  Other Topics Concern  . Not on file  Social History Narrative   *The patient has been married for over 30 years and is currently on disability. He lives with his wife in the [place] area and has one daughter.             Social Determinants of Health   Financial Resource Strain:   . Difficulty of Paying Living Expenses: Not on file  Food Insecurity:   . Worried About Programme researcher, broadcasting/film/video in the Last Year: Not on file  . Ran Out of Food in the Last Year: Not on file  Transportation Needs:   . Lack of Transportation (Medical): Not on file  . Lack of Transportation (Non-Medical): Not on file  Physical Activity:   . Days of Exercise per Week: Not on file  . Minutes of Exercise per Session: Not on file  Stress:   . Feeling of Stress : Not on file  Social Connections:   . Frequency of Communication with Friends and Family: Not on file  . Frequency of Social Gatherings with Friends and Family: Not on file  . Attends Religious Services: Not on file  . Active Member of Clubs or Organizations: Not on file  . Attends Banker Meetings: Not on file  . Marital Status: Not on file    Hospital Course: Christian [name] was admitted for Bipolar affective disorder, depressed, severe, with psychotic behavior (HCC) and crisis management.  He was treated with the following medications Abilify 20mg  po daily for depression and psychosis, Wellbutrin 75mg  po daily for depression, Depakote DR  500mg  po BID for mood  stabilization, and Mirtazapine 15mg  po qhs for depression. [place] was discharged with current medication and was instructed on how to take medications as prescribed; (details listed below under Medication List).  Medical problems were identified and treated as needed.  Home medications were restarted as appropriate. Labs obtained in the ED have been reviewed and assessed. Your a1c is stable at 7.1, goal is less than 6.5. Your Depakote is 32 on 02/01/2020, will need to have this repeated. UDS positive for TCA.  You received your first ECT treatment during this hospital admission on 02/04/2020, see follow up for further directions and treatment care.   Improvement was monitored by observation and [name] daily report of symptom reduction.  Emotional and mental status was monitored by daily self-inventory reports completed by Christian [name] and clinical staff.  Christian [name] was evaluated by the treatment team for stability and plans for continued recovery upon discharge.  Christian [name] motivation was an integral factor for scheduling further treatment.  Employment, transportation, bed availability, health status, family support, and any pending legal issues were also considered during his hospital stay.  He was offered further treatment options upon discharge including but not limited to Residential, Intensive Outpatient, and Outpatient treatment.  Christian [name] will follow up with the services as listed below under Follow Up Information.     Upon completion of this admission the Christian [name] was both mentally and medically stable for discharge denying suicidal/homicidal ideation, auditory/visual/tactile hallucinations, delusional thoughts and paranoia.      Physical Findings: AIMS:  , ,  ,  ,    CIWA:    COWS:     Musculoskeletal: Strength & Muscle Tone: within normal limits Gait & Station: normal Patient leans: N/A  Psychiatric Specialty Exam: See MD  SRA Physical Exam  Review of Systems  Blood pressure (!) 155/98, pulse 86, temperature 98.4 F (36.9 C), temperature source Oral, resp. rate 18, height 6' (1.829 m), weight 117.5 kg, SpO2 100 %.Body mass index is 35.13 kg/m.  Sleep:  Number of Hours: 7.75     Have you used any form of tobacco in the last 30 days? (Cigarettes, Smokeless Tobacco, Cigars, and/or Pipes): No  Has this patient used any form of tobacco in the last 30 days? (Cigarettes, Smokeless Tobacco, Cigars, and/or Pipes)  No  Blood Alcohol level:  Lab Results  Component Value Date   ETH <10 01/30/2020   ETH <10 10/31/2019    Metabolic Disorder Labs:  Lab Results  Component Value Date   HGBA1C 7.1 (H) 01/30/2020   MPG 157.07 01/30/2020   MPG 186 (H) 07/02/2014   No results found for: PROLACTIN Lab Results  Component Value Date   CHOL 108 12/28/2015   TRIG 62 12/28/2015   HDL 33 (L) 12/28/2015   CHOLHDL 3.3 12/28/2015   VLDL 12 12/28/2015   LDLCALC 63 12/28/2015   LDLCALC  03/08/2011    53        Total Cholesterol/HDL:CHD Risk Coronary Heart Disease Risk Table                     Men   Women  1/2 Average Risk   3.4   3.3  Average Risk       5.0   4.4  2 X Average Risk   9.6   7.1  3 X Average Risk  23.4   11.0        Use the calculated Patient Ratio above and the CHD Risk Table to determine the patient's CHD Risk.        ATP III CLASSIFICATION (LDL):  <100     mg/dL   Optimal  716-967  mg/dL   Near or Above                    Optimal  130-159  mg/dL   Borderline  893-810  mg/dL   High  >175     mg/dL   Very High    See Psychiatric Specialty Exam and Suicide Risk Assessment completed by Attending Physician prior to discharge.  Discharge destination:  Home  Is patient on multiple antipsychotic therapies at discharge:  No   Has Patient had three or more failed trials of antipsychotic monotherapy by history:  No  Recommended Plan  for Multiple Antipsychotic Therapies: NA  Discharge  Instructions    Discharge instructions   Complete by: As directed    Please continue to take medications as directed. If your symptoms return, worsen, or persist please call your 911, report to local ER, or contact crisis hotline. Please do not drink alcohol or use any illegal substances while taking prescription medications.     Allergies as of 02/05/2020      Reactions   Zyprexa [olanzapine] Other (See Comments)   Allergy noted by ACT Team. No reaction specified (??)      Medication List    STOP taking these medications   acetaminophen 325 MG tablet Commonly known as: TYLENOL   QUEtiapine 25 MG tablet Commonly known as: SEROQUEL   QUEtiapine 300 MG tablet Commonly known as: SEROQUEL   rivaroxaban 20 MG Tabs tablet Commonly known as: XARELTO     TAKE these medications     Indication  amLODipine 10 MG tablet Commonly known as: NORVASC Take 1 tablet (10 mg total) by mouth daily. Start taking on: February 06, 2020 What changed:   medication strength  how much to take  Indication: High Blood Pressure Disorder   ARIPiprazole 20 MG tablet Commonly known as: ABILIFY Take 1 tablet (20 mg total) by mouth daily. Start taking on: February 06, 2020  Indication: Delusions, Major Depressive Disorder   atorvastatin 10 MG tablet Commonly known as: LIPITOR Take 1 tablet (10 mg total) by mouth daily at 6 PM. What changed: when to take this  Indication: High Amount of Triglycerides in the Blood   buPROPion 75 MG tablet Commonly known as: WELLBUTRIN Take 1 tablet (75 mg total) by mouth daily. Start taking on: February 06, 2020  Indication: Major Depressive Disorder   divalproex 500 MG DR tablet Commonly known as: DEPAKOTE Take 1 tablet (500 mg total) by mouth every 12 (twelve) hours.  Indication: Depressive Phase of Manic-Depression   furosemide 20 MG tablet Commonly known as: LASIX Take 1 tablet (20 mg total) by mouth daily.  Indication: Edema, High Blood Pressure Disorder    glimepiride 2 MG tablet Commonly known as: AMARYL Take 2 mg by mouth daily with breakfast.  Indication: Type 2 Diabetes   liothyronine 25 MCG tablet Commonly known as: CYTOMEL Take 25 mcg by mouth daily before breakfast.  Indication: Depression, Underactive Thyroid   lisinopril 40 MG tablet Commonly known as: ZESTRIL Take 1 tablet (40 mg total) by mouth daily. Start taking on: February 06, 2020 What changed:   medication strength  how much to take  when to take this  Indication: High Blood Pressure Disorder   metoprolol succinate 25 MG 24 hr tablet Commonly known as: TOPROL-XL Take 25 mg by mouth daily.  Indication: High Blood Pressure Disorder   mirtazapine 15 MG disintegrating tablet Commonly known as: REMERON SOL-TAB Take 1 tablet (15 mg total) by mouth at bedtime.  Indication: Major Depressive Disorder   sitaGLIPtin 25 MG tablet Commonly known as: JANUVIA Take 25 mg by mouth daily.  Indication: Type 2 Diabetes      Follow-up Information    [facility] Follow up.   Why: Your ACT team will see you 02/06/20 at your home. Thank You! Contact information: [address] [place] [address] [phone]           Follow-up recommendations:  Activity:  Increase activity as tolerated Diet:  Low salt and heart healthy diet.  Tests:  Your a1c is stable at 7.1,  goal is less than 6.5. Your Depakote is 32 on 02/01/2020, will need to have this repeated Other:  Even if you begin to feel better continue taking your medications. Your psychiatrist is recommending weekly ECT treatments please schedule these with outpatient. Your blood pressure continued to run elevated throughout this admission, please note changes to your blood pressure medications. We recommend you follow up with cardiiology for ongoing management of malignant hypertension.   Comments:   Signed: Maryagnes Amos, FNP 02/05/2020, 10:32 AM

## 2020-02-05 NOTE — Progress Notes (Signed)
D: Patient remains very flat and sad. Isolates to room for the most part. Denies SI, HI and AVH. Came out of room for snack and stayed in the dayroom for a brief period afterwards. Not interacting with patients or staff very much. A: Continue to monitor for safety R: Safety maintained.

## 2020-02-05 NOTE — Progress Notes (Signed)
Recreation Therapy Notes  INPATIENT RECREATION TR PLAN  Patient Details Name: Christian [name] MRN: [id number] DOB: December 23, 1950 Today's Date: 02/05/2020  Rec Therapy Plan Is patient appropriate for Therapeutic Recreation?: Yes Treatment times per week: at least 3 Estimated Length of Stay: 5-7 days TR Treatment/Interventions: Group participation (Comment)  Discharge Criteria Pt will be discharged from therapy if:: Discharged Treatment plan/goals/alternatives discussed and agreed upon by:: Patient/family  Discharge Summary Short term goals set: Patient will engage in groups without prompting or encouragement from LRT x3 group sessions within 5 recreation therapy group sessions Short term goals met: Not met Reason goals not met: Patient did not attend any groups Therapeutic equipment acquired: N/A Reason patient discharged from therapy: Discharge from hospital Pt/family agrees with progress & goals achieved: Yes Date patient discharged from therapy: 02/05/20   Kang Ishida 02/05/2020, 2:40 PM

## 2020-02-05 NOTE — Progress Notes (Signed)
D: Patient is aware of  Discharge this shift .  A: Patient denies suicidal /homicidal ideations. Patient received all belongings brought in   No Storage medications. Writer reviewed Discharge Summary, Suicide Risk Assessment, and Transitional Record. Patient also received Prescriptions   from  MD. Aware  Of follow up appointment .  R: Patient left unit with no questions  Or concerns  With daughter.

## 2020-02-05 NOTE — Plan of Care (Signed)
  Problem: Education: Goal: Knowledge of [facility] Education information/materials will improve Outcome: Adequate for Discharge   Problem: Coping: Goal: Ability to verbalize frustrations and anger appropriately will improve Outcome: Adequate for Discharge   Problem: Safety: Goal: Periods of time without injury will increase Outcome: Adequate for Discharge   Problem: Education: Goal: Utilization of techniques to improve thought processes will improve Outcome: Adequate for Discharge   Problem: Safety: Goal: Ability to identify and utilize support systems that promote safety will improve Outcome: Adequate for Discharge   Problem: Nutritional: Goal: Ability to achieve adequate nutritional intake will improve Outcome: Adequate for Discharge

## 2020-02-05 NOTE — [facility] Group Notes (Signed)
LCSW Group Therapy Note  02/05/2020 1:00 PM  Type of Therapy/Topic:  Group Therapy:  Feelings about Diagnosis  Participation Level:  Did Not Attend   Description of Group:   This group will allow patients to explore their thoughts and feelings about diagnoses they have received. Patients will be guided to explore their level of understanding and acceptance of these diagnoses. Facilitator will encourage patients to process their thoughts and feelings about the reactions of others to their diagnosis and will guide patients in identifying ways to discuss their diagnosis with significant others in their lives. This group will be process-oriented, with patients participating in exploration of their own experiences, giving and receiving support, and processing challenge from other group members.   Therapeutic Goals: 1. Patient will demonstrate understanding of diagnosis as evidenced by identifying two or more symptoms of the disorder 2. Patient will be able to express two feelings regarding the diagnosis 3. Patient will demonstrate their ability to communicate their needs through discussion and/or role play  Summary of Patient Progress: X  Therapeutic Modalities:   Cognitive Behavioral Therapy Brief Therapy Feelings Identification   Raeli Wiens, MSW, LCSW 02/05/2020 12:47 PM  

## 2020-02-05 NOTE — Progress Notes (Signed)
  [facility] Adult Case Management Discharge Plan :  Will you be returning to the same living situation after discharge:  Yes,  home At discharge, do you have transportation home?: Yes,  pts daughter will pick him up around 5pm Do you have the ability to pay for your medications: Yes,  [employer] medicare  Release of information consent forms completed and in the chart;    Patient to Follow up at: Follow-up Information    [facility] Follow up.   Why: Your ACT team will see you 02/06/20 at your home. Thank You! Contact information: [address] [place] [address] [phone]           Next level of care provider has access to [facility] Link:no  Safety Planning and Suicide Prevention discussed: Yes,  SPE completed with pts daughter and legal guardian  Have you used any form of tobacco in the last 30 days? (Cigarettes, Smokeless Tobacco, Cigars, and/or Pipes): No  Has patient been referred to the Quitline?: N/A patient is not a smoker  Patient has been referred for addiction treatment: N/A  Mechele Dawley, LCSW 02/05/2020, 10:14 AM

## 2020-02-13 ENCOUNTER — Telehealth: Payer: Self-pay

## 2020-02-15 ENCOUNTER — Other Ambulatory Visit: Payer: Self-pay

## 2020-02-15 ENCOUNTER — Other Ambulatory Visit
Admission: RE | Admit: 2020-02-15 | Discharge: 2020-02-15 | Disposition: A | Discharge status: Home / Self Care | Payer: Medicare PPO | Source: Ambulatory Visit | Attending: Psychiatry | Admitting: Psychiatry

## 2020-02-15 DIAGNOSIS — Z01812 Encounter for preprocedural laboratory examination: Secondary | ICD-10-CM | POA: Diagnosis present

## 2020-02-15 DIAGNOSIS — Z20822 Contact with and (suspected) exposure to covid-19: Secondary | ICD-10-CM | POA: Diagnosis not present

## 2020-02-16 LAB — SARS CORONAVIRUS 2 (TAT 6-24 HRS): SARS Coronavirus 2: NEGATIVE

## 2020-02-19 ENCOUNTER — Other Ambulatory Visit: Payer: Self-pay | Admitting: Psychiatry

## 2020-02-20 ENCOUNTER — Other Ambulatory Visit: Payer: Self-pay

## 2020-02-20 ENCOUNTER — Encounter: Payer: Self-pay | Admitting: Certified Registered Nurse Anesthetist

## 2020-02-20 ENCOUNTER — Encounter (HOSPITAL_BASED_OUTPATIENT_CLINIC_OR_DEPARTMENT_OTHER)
Admission: RE | Admit: 2020-02-20 | Discharge: 2020-02-20 | Disposition: A | Discharge status: Home / Self Care | Payer: Medicare PPO | Source: Ambulatory Visit | Attending: Psychiatry | Admitting: Psychiatry

## 2020-02-20 DIAGNOSIS — F319 Bipolar disorder, unspecified: Secondary | ICD-10-CM | POA: Diagnosis not present

## 2020-02-20 DIAGNOSIS — E1122 Type 2 diabetes mellitus with diabetic chronic kidney disease: Secondary | ICD-10-CM | POA: Diagnosis not present

## 2020-02-20 DIAGNOSIS — E43 Unspecified severe protein-calorie malnutrition: Secondary | ICD-10-CM | POA: Diagnosis not present

## 2020-02-20 DIAGNOSIS — Z882 Allergy status to sulfonamides status: Secondary | ICD-10-CM | POA: Diagnosis not present

## 2020-02-20 DIAGNOSIS — F314 Bipolar disorder, current episode depressed, severe, without psychotic features: Secondary | ICD-10-CM | POA: Diagnosis not present

## 2020-02-20 DIAGNOSIS — Z86711 Personal history of pulmonary embolism: Secondary | ICD-10-CM | POA: Diagnosis not present

## 2020-02-20 DIAGNOSIS — G4733 Obstructive sleep apnea (adult) (pediatric): Secondary | ICD-10-CM | POA: Diagnosis not present

## 2020-02-20 DIAGNOSIS — I480 Paroxysmal atrial fibrillation: Secondary | ICD-10-CM | POA: Diagnosis not present

## 2020-02-20 DIAGNOSIS — Z8249 Family history of ischemic heart disease and other diseases of the circulatory system: Secondary | ICD-10-CM | POA: Diagnosis not present

## 2020-02-20 DIAGNOSIS — N189 Chronic kidney disease, unspecified: Secondary | ICD-10-CM | POA: Diagnosis not present

## 2020-02-20 DIAGNOSIS — E119 Type 2 diabetes mellitus without complications: Secondary | ICD-10-CM | POA: Diagnosis not present

## 2020-02-20 DIAGNOSIS — I129 Hypertensive chronic kidney disease with stage 1 through stage 4 chronic kidney disease, or unspecified chronic kidney disease: Secondary | ICD-10-CM | POA: Diagnosis not present

## 2020-02-20 DIAGNOSIS — Z818 Family history of other mental and behavioral disorders: Secondary | ICD-10-CM | POA: Diagnosis not present

## 2020-02-20 DIAGNOSIS — Z833 Family history of diabetes mellitus: Secondary | ICD-10-CM | POA: Diagnosis not present

## 2020-02-20 LAB — GLUCOSE, CAPILLARY: Glucose-Capillary: 200 mg/dL — ABNORMAL HIGH (ref 70–99)

## 2020-02-20 MED ORDER — METHOHEXITAL SODIUM 100 MG/10ML IV SOSY
PREFILLED_SYRINGE | INTRAVENOUS | Status: DC | PRN
  Administered 2020-02-20: 100 mg via INTRAVENOUS

## 2020-02-20 MED ORDER — LABETALOL HCL 5 MG/ML IV SOLN
INTRAVENOUS | Status: DC | PRN
  Administered 2020-02-20 (×2): 10 mg via INTRAVENOUS

## 2020-02-20 MED ORDER — SUCCINYLCHOLINE CHLORIDE 200 MG/10ML IV SOSY
PREFILLED_SYRINGE | INTRAVENOUS | Status: AC
  Filled 2020-02-20: qty 10

## 2020-02-20 MED ORDER — ONDANSETRON HCL 4 MG/2ML IJ SOLN: 4.0000 mg | Freq: Once | INTRAMUSCULAR | Status: DC | PRN

## 2020-02-20 MED ORDER — FENTANYL CITRATE (PF) 100 MCG/2ML IJ SOLN: 25.0000 ug | INTRAMUSCULAR | Status: DC | PRN

## 2020-02-20 MED ORDER — SODIUM CHLORIDE 0.9 % IV SOLN
500.0000 mL | Freq: Once | INTRAVENOUS | Status: AC
  Administered 2020-02-20: 500 mL via INTRAVENOUS

## 2020-02-20 MED ORDER — LABETALOL HCL 5 MG/ML IV SOLN
INTRAVENOUS | Status: AC
  Filled 2020-02-20: qty 4

## 2020-02-20 MED ORDER — SUCCINYLCHOLINE CHLORIDE 200 MG/10ML IV SOSY
PREFILLED_SYRINGE | INTRAVENOUS | Status: DC | PRN
  Administered 2020-02-20: 150 mg via INTRAVENOUS

## 2020-02-20 NOTE — Discharge Instructions (Signed)
1)  The drugs that you have been given will stay in your system until tomorrow so for the       next 24 hours you should not:  A. Drive an automobile  B. Make any legal decisions  C. Drink any alcoholic beverages  2)  You may resume your regular meals upon return home.  3)  A responsible adult must take you home.  Someone should stay with you for a few          hours, then be available by phone for the remainder of the treatment day.  4)  You May experience any of the following symptoms:  Headache, Nausea and a dry mouth (due to the medications you were given),  temporary memory loss and some confusion, or sore muscles (a warm bath  should help this).  If you you experience any of these symptoms let us know on                your return visit.  5)  Report any of the following: any acute discomfort, severe headache, or temperature        greater than 100.5 F.   Also report any unusual redness, swelling, drainage, or pain         at your IV site.    You may report Symptoms to:  ECT PROGRAM- [place] [facility]          Phone: [phone], ECT Department           or Dr. Shary Key office [phone]  6)  Your next ECT Treatment is Monday April 5 at 8:30   We will call 2 days prior to your scheduled appointment for arrival times.  7)  Nothing to eat or drink after midnight the night before your procedure.  8)  Take      With a sip of water the morning of your procedure.  9)  Other Instructions: Call [phone] to cancel the morning of your procedure due         to illness or emergency.  10) We will call within 72 hours to assess how you are feeling.

## 2020-02-20 NOTE — H&P (Signed)
Christian [name] is an 69 y.o. male.   Chief Complaint: depression, paranoid, low energy HPI: recurrent depression in bipolar disorder  Past Medical History:  Diagnosis Date  . Bilateral lower extremity edema: chronic with venous stasis changes 06/02/2014  . Bipolar 1 disorder (HCC)   . Bipolar disorder (HCC)   . Chronic kidney disease   . Gout   . Hx of blood clots   . Hypertension   . Mood swings   . OSA on CPAP 06/02/2014  . Other and unspecified hyperlipidemia 06/02/2014  . Type II or unspecified type diabetes mellitus with unspecified complication, uncontrolled 06/02/2014    History reviewed. No pertinent surgical history.  Family History  Problem Relation Age of Onset  . Dementia Mother   . Arthritis Father   . Hypertension Father   . Mental illness Sister   . Diabetes Sister   . Heart failure Neg Hx   . Kidney failure Neg Hx   . Cancer Neg Hx    Social History:  reports that he has never smoked. He has never used smokeless tobacco. He reports that he does not drink alcohol or use drugs.  Allergies:  Allergies  Allergen Reactions  . Zyprexa [Olanzapine] Other (See Comments)    Allergy noted by ACT Team. No reaction specified (??)    (Not in a hospital admission)   Results for orders placed or performed during the hospital encounter of 02/20/20 (from the past 48 hour(s))  Glucose, capillary     Status: Abnormal   Collection Time: 02/20/20  9:05 AM  Result Value Ref Range   Glucose-Capillary 200 (H) 70 - 99 mg/dL    Comment: Glucose reference range applies only to samples taken after fasting for at least 8 hours.   No results found.  Review of Systems  Constitutional: Positive for appetite change.  HENT: Negative.   Eyes: Negative.   Respiratory: Negative.   Cardiovascular: Negative.   Gastrointestinal: Negative.   Musculoskeletal: Negative.   Skin: Negative.   Neurological: Negative.   Psychiatric/Behavioral: Positive for dysphoric mood.    Blood  pressure (!) 160/92, pulse 99, temperature 99 F (37.2 C), resp. rate 18, height 6' (1.829 m), weight 120.2 kg, SpO2 99 %. Physical Exam  Nursing note and vitals reviewed. Constitutional: He appears well-developed and well-nourished.  HENT:  Head: Normocephalic and atraumatic.  Eyes: Pupils are equal, round, and reactive to light. Conjunctivae are normal.  Cardiovascular: Regular rhythm and normal heart sounds.  Respiratory: Effort normal.  GI: Soft.  Musculoskeletal:        General: Normal range of motion.     Cervical back: Normal range of motion.  Neurological: He is alert.  Skin: Skin is warm and dry.  Psychiatric: His affect is blunt. His speech is delayed. He is slowed and withdrawn. Thought content is paranoid. Cognition and memory are impaired. He expresses inappropriate judgment. He expresses no suicidal ideation.     Assessment/Plan Negotiation treatment every two weeks  [name], MD 02/20/2020, 9:46 AM

## 2020-02-20 NOTE — Procedures (Signed)
ECT SERVICES Physician's Interval Evaluation & Treatment Note  Patient Identification: Christian [name] MRN:  [id number] Date of Evaluation:  02/20/2020 TX #: 5  MADRS:   MMSE:   P.E. Findings:  No change  Psychiatric Interval Note:  Withdrawn and not feeling well. Denies suicidal ideation  Subjective:  Patient is a 69 y.o. male seen for evaluation for Electroconvulsive Therapy. Not good but stable  Treatment Summary:   [x]   Right Unilateral             []  Bilateral   % Energy : 0.89ms, 100%   Impedance: 13[name]  Seizure Energy Index: no reading  Postictal Suppression Index: no reading  Seizure Concordance Index: 43%  Medications  Pre Shock: labetalol 20mg , brevital 100mg , succinyl choline 150mg   Post Shock:    Seizure Duration: 24 sec emg, 24sec eeg   Comments: Discussed options and agree to follow up 2 weeks   Lungs:  [x]   Clear to auscultation               []  Other:   Heart:    [x]   Regular rhythm             []  irregular rhythm    [x]   Previous H&P reviewed, patient examined and there are NO CHANGES                 []   Previous H&P reviewed, patient examined and there are changes noted.   , MD 3/24/20213:47 PM

## 2020-02-20 NOTE — Anesthesia Postprocedure Evaluation (Signed)
Anesthesia Post Note  Patient: Christian [name]  Procedure(s) Performed: ECT TX  Patient location during evaluation: PACU Anesthesia Type: General Level of consciousness: awake and alert Pain management: pain level controlled Vital Signs Assessment: post-procedure vital signs reviewed and stable Respiratory status: spontaneous breathing, nonlabored ventilation, respiratory function stable and patient connected to nasal cannula oxygen Cardiovascular status: blood pressure returned to baseline and stable Postop Assessment: no apparent nausea or vomiting Anesthetic complications: no     Last Vitals:  Vitals:   02/20/20 1059 02/20/20 1108  BP: (!) 149/90 (!) 145/89  Pulse: (!) 104 98  Resp: (!) 23 20  Temp: 37.1 C 37.1 C  SpO2: 92%     Last Pain:  Vitals:   02/20/20 1108  TempSrc: Oral  PainSc: 0-No pain                 Yevette Edwards

## 2020-02-20 NOTE — Transfer of Care (Signed)
Immediate Anesthesia Transfer of Care Note  Patient: Christian [name]  Procedure(s) Performed: ECT TX  Patient Location: PACU  Anesthesia Type:General  Level of Consciousness: awake, alert  and oriented  Airway & Oxygen Therapy: Patient Spontanous Breathing and Patient connected to face mask oxygen  Post-op Assessment: Report given to RN and Post -op Vital signs reviewed and stable  Post vital signs: Reviewed and stable  Last Vitals:  Vitals Value Taken Time  BP    Temp 37.4 C 02/20/20 1042  Pulse 109 02/20/20 1041  Resp 22 02/20/20 1041  SpO2 100 % 02/20/20 1041  Vitals shown include unvalidated device data.  Last Pain:  Vitals:   02/20/20 0835  PainSc: 0-No pain         Complications: No apparent anesthesia complications

## 2020-02-20 NOTE — Anesthesia Procedure Notes (Signed)
Procedure Name: General with mask airway Date/Time: 02/20/2020 10:30 AM Performed by: Rosanne Gutting, CRNA Pre-anesthesia Checklist: Patient identified, Emergency Drugs available, Suction available and Patient being monitored Patient Re-evaluated:Patient Re-evaluated prior to induction Oxygen Delivery Method: Circle system utilized Preoxygenation: Pre-oxygenation with 100% oxygen Induction Type: IV induction Ventilation: Mask ventilation without difficulty, Mask ventilation throughout procedure and Oral airway inserted - appropriate to patient size Airway Equipment and Method: Bite block Placement Confirmation: positive ETCO2 Dental Injury: Teeth and Oropharynx as per pre-operative assessment

## 2020-02-20 NOTE — Anesthesia Preprocedure Evaluation (Signed)
Anesthesia Evaluation  Patient identified by MRN, date of birth, ID band Patient awake    Reviewed: Allergy & Precautions, H&P , NPO status , Patient's Chart, lab work & pertinent test results, reviewed documented beta blocker date and time   Airway Mallampati: II   Neck ROM: full    Dental  (+) Poor Dentition   Pulmonary shortness of breath and with exertion, sleep apnea ,    Pulmonary exam normal        Cardiovascular Exercise Tolerance: Poor hypertension, On Medications negative cardio ROS Normal cardiovascular exam Rhythm:regular Rate:Normal     Neuro/Psych PSYCHIATRIC DISORDERS Depression Bipolar Disorder Schizophrenia negative neurological ROS     GI/Hepatic negative GI ROS, Neg liver ROS,   Endo/Other  negative endocrine ROSdiabetes, Well Controlled, Oral Hypoglycemic Agents  Renal/GU Renal disease  negative genitourinary   Musculoskeletal   Abdominal   Peds  Hematology negative hematology ROS (+)   Anesthesia Other Findings Past Medical History: 06/02/2014: Bilateral lower extremity edema: chronic with venous stasis  changes No date: Bipolar 1 disorder (HCC) No date: Bipolar disorder (HCC) No date: Chronic kidney disease No date: Gout No date: Hx of blood clots No date: Hypertension No date: Mood swings 06/02/2014: OSA on CPAP 06/02/2014: Other and unspecified hyperlipidemia 06/02/2014: Type II or unspecified type diabetes mellitus with  unspecified complication, uncontrolled History reviewed. No pertinent surgical history. BMI    Body Mass Index: 35.94 kg/m     Reproductive/Obstetrics negative OB ROS                             Anesthesia Physical Anesthesia Plan  ASA: III  Anesthesia Plan: General   Post-op Pain Management:    Induction:   PONV Risk Score and Plan:   Airway Management Planned:   Additional Equipment:   Intra-op Plan:   Post-operative Plan:    Informed Consent: I have reviewed the patients History and Physical, chart, labs and discussed the procedure including the risks, benefits and alternatives for the proposed anesthesia with the patient or authorized representative who has indicated his/her understanding and acceptance.     Dental Advisory Given  Plan Discussed with: CRNA  Anesthesia Plan Comments:         Anesthesia Quick Evaluation

## 2020-02-29 ENCOUNTER — Other Ambulatory Visit
Admission: RE | Admit: 2020-02-29 | Discharge: 2020-02-29 | Disposition: A | Discharge status: Home / Self Care | Payer: Medicare PPO | Source: Ambulatory Visit | Attending: Psychiatry | Admitting: Psychiatry

## 2020-02-29 ENCOUNTER — Other Ambulatory Visit: Payer: Self-pay

## 2020-02-29 DIAGNOSIS — Z01812 Encounter for preprocedural laboratory examination: Secondary | ICD-10-CM | POA: Diagnosis present

## 2020-02-29 DIAGNOSIS — Z20822 Contact with and (suspected) exposure to covid-19: Secondary | ICD-10-CM | POA: Diagnosis not present

## 2020-03-01 LAB — SARS CORONAVIRUS 2 (TAT 6-24 HRS): SARS Coronavirus 2: NEGATIVE

## 2020-03-03 ENCOUNTER — Encounter: Payer: Self-pay | Admitting: Certified Registered Nurse Anesthetist

## 2020-03-03 ENCOUNTER — Other Ambulatory Visit: Payer: Self-pay

## 2020-03-03 ENCOUNTER — Encounter
Admission: RE | Admit: 2020-03-03 | Discharge: 2020-03-03 | Disposition: A | Discharge status: Home / Self Care | Payer: Medicare PPO | Source: Ambulatory Visit | Attending: Psychiatry | Admitting: Psychiatry

## 2020-03-03 DIAGNOSIS — E1122 Type 2 diabetes mellitus with diabetic chronic kidney disease: Secondary | ICD-10-CM | POA: Insufficient documentation

## 2020-03-03 DIAGNOSIS — I129 Hypertensive chronic kidney disease with stage 1 through stage 4 chronic kidney disease, or unspecified chronic kidney disease: Secondary | ICD-10-CM | POA: Insufficient documentation

## 2020-03-03 DIAGNOSIS — Z86711 Personal history of pulmonary embolism: Secondary | ICD-10-CM | POA: Diagnosis not present

## 2020-03-03 DIAGNOSIS — Z818 Family history of other mental and behavioral disorders: Secondary | ICD-10-CM | POA: Diagnosis not present

## 2020-03-03 DIAGNOSIS — E43 Unspecified severe protein-calorie malnutrition: Secondary | ICD-10-CM | POA: Diagnosis not present

## 2020-03-03 DIAGNOSIS — F319 Bipolar disorder, unspecified: Secondary | ICD-10-CM | POA: Insufficient documentation

## 2020-03-03 DIAGNOSIS — R7981 Abnormal blood-gas level: Secondary | ICD-10-CM | POA: Insufficient documentation

## 2020-03-03 DIAGNOSIS — Z8249 Family history of ischemic heart disease and other diseases of the circulatory system: Secondary | ICD-10-CM | POA: Insufficient documentation

## 2020-03-03 DIAGNOSIS — I480 Paroxysmal atrial fibrillation: Secondary | ICD-10-CM | POA: Diagnosis not present

## 2020-03-03 DIAGNOSIS — E119 Type 2 diabetes mellitus without complications: Secondary | ICD-10-CM | POA: Diagnosis not present

## 2020-03-03 DIAGNOSIS — G4733 Obstructive sleep apnea (adult) (pediatric): Secondary | ICD-10-CM | POA: Diagnosis not present

## 2020-03-03 DIAGNOSIS — N189 Chronic kidney disease, unspecified: Secondary | ICD-10-CM | POA: Insufficient documentation

## 2020-03-03 DIAGNOSIS — F251 Schizoaffective disorder, depressive type: Secondary | ICD-10-CM | POA: Diagnosis not present

## 2020-03-03 DIAGNOSIS — Z833 Family history of diabetes mellitus: Secondary | ICD-10-CM | POA: Insufficient documentation

## 2020-03-03 DIAGNOSIS — Z882 Allergy status to sulfonamides status: Secondary | ICD-10-CM | POA: Insufficient documentation

## 2020-03-03 MED ORDER — SUCCINYLCHOLINE CHLORIDE 200 MG/10ML IV SOSY
PREFILLED_SYRINGE | INTRAVENOUS | Status: AC
  Filled 2020-03-03: qty 10

## 2020-03-03 MED ORDER — METHOHEXITAL SODIUM 100 MG/10ML IV SOSY
PREFILLED_SYRINGE | INTRAVENOUS | Status: DC | PRN
  Administered 2020-03-03: 100 mg via INTRAVENOUS

## 2020-03-03 MED ORDER — LABETALOL HCL 5 MG/ML IV SOLN
INTRAVENOUS | Status: DC | PRN
  Administered 2020-03-03 (×2): 10 mg via INTRAVENOUS

## 2020-03-03 MED ORDER — LABETALOL HCL 5 MG/ML IV SOLN
INTRAVENOUS | Status: AC
  Filled 2020-03-03: qty 4

## 2020-03-03 MED ORDER — SUCCINYLCHOLINE CHLORIDE 200 MG/10ML IV SOSY
PREFILLED_SYRINGE | INTRAVENOUS | Status: DC | PRN
  Administered 2020-03-03: 150 mg via INTRAVENOUS

## 2020-03-03 MED ORDER — SODIUM CHLORIDE 0.9 % IV SOLN: INTRAVENOUS | Status: DC | PRN

## 2020-03-03 MED ORDER — SODIUM CHLORIDE 0.9 % IV SOLN
500.0000 mL | Freq: Once | INTRAVENOUS | Status: AC
  Administered 2020-03-03: 500 mL via INTRAVENOUS

## 2020-03-03 NOTE — Procedures (Signed)
ECT SERVICES Physician's Interval Evaluation & Treatment Note  Patient Identification: Christian [name] MRN:  [id number] Date of Evaluation:  03/03/2020 TX #: 6  MADRS:   MMSE:   P.E. Findings:  No change to physical exam  Psychiatric Interval Note:  Mood stable dysphoric flat and withdrawn but not suicidal  Subjective:  Patient is a 69 y.o. male seen for evaluation for Electroconvulsive Therapy. No specific complaint  Treatment Summary:   [x]   Right Unilateral             []  Bilateral   % Energy : 0.3 ms 100%   Impedance: 1350 ohms  Seizure Energy Index: 1866 V squared  Postictal Suppression Index: No reading  Seizure Concordance Index: 66%  Medications  Pre Shock: Labetalol 20 mg Brevital 100 mg succinylcholine 150 mg  Post Shock:    Seizure Duration: 24 seconds EMG 28 seconds EEG   Comments: Follow-up 2 weeks  Lungs:  [x]   Clear to auscultation               []  Other:   Heart:    [x]   Regular rhythm             []  irregular rhythm    [x]   Previous H&P reviewed, patient examined and there are NO CHANGES                 []   Previous H&P reviewed, patient examined and there are changes noted.   , MD 4/5/20215:10 PM

## 2020-03-03 NOTE — Discharge Instructions (Signed)
1)  The drugs that you have been given will stay in your system until tomorrow so for the       next 24 hours you should not:  A. Drive an automobile  B. Make any legal decisions  C. Drink any alcoholic beverages  2)  You may resume your regular meals upon return home.  3)  A responsible adult must take you home.  Someone should stay with you for a few          hours, then be available by phone for the remainder of the treatment day.  4)  You May experience any of the following symptoms:  Headache, Nausea and a dry mouth (due to the medications you were given),  temporary memory loss and some confusion, or sore muscles (a warm bath  should help this).  If you you experience any of these symptoms let us know on                your return visit.  5)  Report any of the following: any acute discomfort, severe headache, or temperature        greater than 100.5 F.   Also report any unusual redness, swelling, drainage, or pain         at your IV site.    You may report Symptoms to:  ECT PROGRAM- [place] [facility]          Phone: [phone], ECT Department           or Dr. Shary Key office [phone]  6)  Your next ECT Treatment is Monday April 19 at 8:30   We will call 2 days prior to your scheduled appointment for arrival times.  7)  Nothing to eat or drink after midnight the night before your procedure.  8)  Take blood pressure meds  with a sip of water the morning of your procedure.  9)  Other Instructions: Call [phone] to cancel the morning of your procedure due         to illness or emergency.  10) We will call within 72 hours to assess how you are feeling.

## 2020-03-03 NOTE — H&P (Signed)
Christian [name] is an 69 y.o. male.   Chief Complaint: Patient continues to complain of bad mood but no psychotic or suicidal symptoms HPI: Recurrent depression now chronic  Past Medical History:  Diagnosis Date  . Bilateral lower extremity edema: chronic with venous stasis changes 06/02/2014  . Bipolar 1 disorder (HCC)   . Bipolar disorder (HCC)   . Chronic kidney disease   . Gout   . Hx of blood clots   . Hypertension   . Mood swings   . OSA on CPAP 06/02/2014  . Other and unspecified hyperlipidemia 06/02/2014  . Type II or unspecified type diabetes mellitus with unspecified complication, uncontrolled 06/02/2014    History reviewed. No pertinent surgical history.  Family History  Problem Relation Age of Onset  . Dementia Mother   . Arthritis Father   . Hypertension Father   . Mental illness Sister   . Diabetes Sister   . Heart failure Neg Hx   . Kidney failure Neg Hx   . Cancer Neg Hx    Social History:  reports that he has never smoked. He has never used smokeless tobacco. He reports that he does not drink alcohol or use drugs.  Allergies:  Allergies  Allergen Reactions  . Zyprexa [Olanzapine] Other (See Comments)    Allergy noted by ACT Team. No reaction specified (??)    (Not in a hospital admission)   No results found for this or any previous visit (from the past 48 hour(s)). No results found.  Review of Systems  Constitutional: Negative.   HENT: Negative.   Eyes: Negative.   Respiratory: Negative.   Cardiovascular: Negative.   Gastrointestinal: Negative.   Musculoskeletal: Negative.   Skin: Negative.   Neurological: Negative.   Psychiatric/Behavioral: Positive for dysphoric mood. The patient is nervous/anxious.     Blood pressure (!) 148/85, pulse (!) 101, temperature 98.1 F (36.7 C), temperature source Oral, resp. rate 20, height 6' (1.829 m), weight 129.7 kg, SpO2 99 %. Physical Exam  Nursing note and vitals reviewed. Constitutional: He appears  well-developed and well-nourished.  HENT:  Head: Normocephalic and atraumatic.  Eyes: Pupils are equal, round, and reactive to light. Conjunctivae are normal.  Cardiovascular: Regular rhythm and normal heart sounds.  Respiratory: Effort normal. No respiratory distress.  GI: Soft.  Musculoskeletal:        General: Normal range of motion.     Cervical back: Normal range of motion.  Neurological: He is alert.  Skin: Skin is warm and dry.  Psychiatric: His affect is blunt. His speech is delayed. He is slowed. Thought content is paranoid. Cognition and memory are impaired. He expresses inappropriate judgment. He expresses no homicidal and no suicidal ideation.     Assessment/Plan Continue plans for ECT treatment as maintenance after treatment today follow-up 2 weeks.  [name], MD 03/03/2020, 5:13 PM

## 2020-03-03 NOTE — Anesthesia Procedure Notes (Signed)
Procedure Name: General with mask airway Date/Time: 03/03/2020 10:28 AM Performed by: Rosanne Gutting, CRNA Pre-anesthesia Checklist: Patient identified, Emergency Drugs available, Suction available and Patient being monitored Patient Re-evaluated:Patient Re-evaluated prior to induction Oxygen Delivery Method: Circle system utilized Preoxygenation: Pre-oxygenation with 100% oxygen Induction Type: IV induction Ventilation: Mask ventilation without difficulty, Mask ventilation throughout procedure and Oral airway inserted - appropriate to patient size Airway Equipment and Method: Bite block Placement Confirmation: positive ETCO2 Dental Injury: Teeth and Oropharynx as per pre-operative assessment

## 2020-03-03 NOTE — Transfer of Care (Signed)
Immediate Anesthesia Transfer of Care Note  Patient: Christian [name]  Procedure(s) Performed: ECT TX  Patient Location: PACU  Anesthesia Type:General  Level of Consciousness: awake and alert   Airway & Oxygen Therapy: Patient Spontanous Breathing and Patient connected to face mask oxygen  Post-op Assessment: Report given to RN and Post -op Vital signs reviewed and stable  Post vital signs: Reviewed and stable  Last Vitals:  Vitals Value Taken Time  BP 173/101 03/03/20 1040  Temp 36.6 C 03/03/20 1039  Pulse 102 03/03/20 1043  Resp 21 03/03/20 1043  SpO2 100 % 03/03/20 1043  Vitals shown include unvalidated device data.  Last Pain:  Vitals:   03/03/20 1039  TempSrc:   PainSc: 0-No pain         Complications: No apparent anesthesia complications

## 2020-03-03 NOTE — Anesthesia Postprocedure Evaluation (Signed)
Anesthesia Post Note  Patient: Christian [name]  Procedure(s) Performed: ECT TX  Patient location during evaluation: PACU Anesthesia Type: General Level of consciousness: awake and alert Pain management: pain level controlled Vital Signs Assessment: post-procedure vital signs reviewed and stable Respiratory status: spontaneous breathing, nonlabored ventilation, respiratory function stable and patient connected to nasal cannula oxygen Cardiovascular status: blood pressure returned to baseline and stable Postop Assessment: no apparent nausea or vomiting Anesthetic complications: no     Last Vitals:  Vitals:   03/03/20 1059 03/03/20 1111  BP: (!) 154/96 (!) 148/85  Pulse: (!) 103 (!) 101  Resp: (!) 27 20  Temp: 36.7 C 36.7 C  SpO2: 99%     Last Pain:  Vitals:   03/03/20 1111  TempSrc: Oral  PainSc: 0-No pain                 Lenard Simmer

## 2020-03-03 NOTE — Anesthesia Preprocedure Evaluation (Signed)
Anesthesia Evaluation  Patient identified by MRN, date of birth, ID band Patient awake    Reviewed: Allergy & Precautions, NPO status , Patient's Chart, lab work & pertinent test results  History of Anesthesia Complications Negative for: history of anesthetic complications  Airway Mallampati: III  TM Distance: >3 FB Neck ROM: Limited    Dental  (+) Poor Dentition, Missing   Pulmonary shortness of breath and with exertion, sleep apnea and Continuous Positive Airway Pressure Ventilation , neg COPD, neg recent URI,    Pulmonary exam normal        Cardiovascular Exercise Tolerance: Poor hypertension, Pt. on medications and Pt. on home beta blockers (-) angina+ Peripheral Vascular Disease  (-) Past MI and (-) Cardiac Stents Normal cardiovascular exam(-) dysrhythmias (-) Valvular Problems/Murmurs  On elequis.   Neuro/Psych PSYCHIATRIC DISORDERS Depression Bipolar Disorder negative neurological ROS     GI/Hepatic negative GI ROS, Neg liver ROS,   Endo/Other  diabetes, Well Controlled, Type 2BG 88.  Renal/GU CRFRenal disease  negative genitourinary   Musculoskeletal negative musculoskeletal ROS (+)   Abdominal (+) + obese,   Peds negative pediatric ROS (+)  Hematology negative hematology ROS (+)   Anesthesia Other Findings Past Medical History: 06/02/2014: Bilateral lower extremity edema: chronic with venous stasis  changes No date: Bipolar 1 disorder (HCC) No date: Bipolar disorder (HCC) No date: Chronic kidney disease No date: Gout No date: Hx of blood clots No date: Hypertension No date: Mood swings 06/02/2014: OSA on CPAP 06/02/2014: Other and unspecified hyperlipidemia 06/02/2014: Type II or unspecified type diabetes mellitus with  unspecified complication, uncontrolled   Reproductive/Obstetrics                             Anesthesia Physical  Anesthesia Plan  ASA: III  Anesthesia  Plan: General   Post-op Pain Management:    Induction: Intravenous  PONV Risk Score and Plan: 2 and TIVA  Airway Management Planned: Mask  Additional Equipment:   Intra-op Plan:   Post-operative Plan:   Informed Consent: I have reviewed the patients History and Physical, chart, labs and discussed the procedure including the risks, benefits and alternatives for the proposed anesthesia with the patient or authorized representative who has indicated his/her understanding and acceptance.       Plan Discussed with: CRNA  Anesthesia Plan Comments:         Anesthesia Quick Evaluation

## 2020-03-14 ENCOUNTER — Other Ambulatory Visit
Admission: RE | Admit: 2020-03-14 | Discharge: 2020-03-14 | Disposition: A | Discharge status: Home / Self Care | Payer: Medicare PPO | Source: Ambulatory Visit | Attending: Psychiatry | Admitting: Psychiatry

## 2020-03-14 ENCOUNTER — Other Ambulatory Visit: Payer: Self-pay | Admitting: Psychiatry

## 2020-03-14 DIAGNOSIS — Z01812 Encounter for preprocedural laboratory examination: Secondary | ICD-10-CM | POA: Insufficient documentation

## 2020-03-14 DIAGNOSIS — Z20822 Contact with and (suspected) exposure to covid-19: Secondary | ICD-10-CM | POA: Insufficient documentation

## 2020-03-14 LAB — SARS CORONAVIRUS 2 (TAT 6-24 HRS): SARS Coronavirus 2: NEGATIVE

## 2020-03-17 ENCOUNTER — Other Ambulatory Visit: Payer: Self-pay

## 2020-03-17 ENCOUNTER — Inpatient Hospital Stay: Payer: Medicare PPO

## 2020-03-17 ENCOUNTER — Inpatient Hospital Stay
Admission: AD | Admit: 2020-03-17 | Discharge: 2020-03-21 | DRG: 177 | Disposition: A | Discharge status: Home Health | Payer: Medicare PPO | Source: Ambulatory Visit | Attending: Internal Medicine | Admitting: Internal Medicine

## 2020-03-17 ENCOUNTER — Encounter: Payer: Self-pay | Admitting: Internal Medicine

## 2020-03-17 ENCOUNTER — Inpatient Hospital Stay: Admission: AD | Admit: 2020-03-17 | Payer: Medicare PPO | Source: Ambulatory Visit | Admitting: Internal Medicine

## 2020-03-17 ENCOUNTER — Inpatient Hospital Stay: Payer: Medicare PPO | Admitting: Anesthesiology

## 2020-03-17 ENCOUNTER — Encounter (HOSPITAL_BASED_OUTPATIENT_CLINIC_OR_DEPARTMENT_OTHER)
Admission: RE | Admit: 2020-03-17 | Discharge: 2020-03-17 | Disposition: A | Discharge status: Home / Self Care | Payer: Medicare PPO | Source: Ambulatory Visit | Attending: Psychiatry | Admitting: Psychiatry

## 2020-03-17 ENCOUNTER — Ambulatory Visit
Admission: RE | Admit: 2020-03-17 | Discharge: 2020-03-17 | Disposition: A | Discharge status: Home / Self Care | Payer: Medicare PPO | Source: Ambulatory Visit | Attending: Anesthesiology | Admitting: Anesthesiology

## 2020-03-17 DIAGNOSIS — I878 Other specified disorders of veins: Secondary | ICD-10-CM | POA: Diagnosis present

## 2020-03-17 DIAGNOSIS — Z833 Family history of diabetes mellitus: Secondary | ICD-10-CM

## 2020-03-17 DIAGNOSIS — Z8261 Family history of arthritis: Secondary | ICD-10-CM

## 2020-03-17 DIAGNOSIS — G4733 Obstructive sleep apnea (adult) (pediatric): Secondary | ICD-10-CM | POA: Diagnosis present

## 2020-03-17 DIAGNOSIS — E785 Hyperlipidemia, unspecified: Secondary | ICD-10-CM | POA: Diagnosis present

## 2020-03-17 DIAGNOSIS — F319 Bipolar disorder, unspecified: Secondary | ICD-10-CM | POA: Diagnosis present

## 2020-03-17 DIAGNOSIS — Z7984 Long term (current) use of oral hypoglycemic drugs: Secondary | ICD-10-CM | POA: Diagnosis not present

## 2020-03-17 DIAGNOSIS — I2699 Other pulmonary embolism without acute cor pulmonale: Secondary | ICD-10-CM | POA: Diagnosis present

## 2020-03-17 DIAGNOSIS — L89152 Pressure ulcer of sacral region, stage 2: Secondary | ICD-10-CM | POA: Diagnosis present

## 2020-03-17 DIAGNOSIS — Z86711 Personal history of pulmonary embolism: Secondary | ICD-10-CM

## 2020-03-17 DIAGNOSIS — E039 Hypothyroidism, unspecified: Secondary | ICD-10-CM | POA: Diagnosis present

## 2020-03-17 DIAGNOSIS — J9601 Acute respiratory failure with hypoxia: Secondary | ICD-10-CM

## 2020-03-17 DIAGNOSIS — Z888 Allergy status to other drugs, medicaments and biological substances status: Secondary | ICD-10-CM | POA: Diagnosis not present

## 2020-03-17 DIAGNOSIS — F251 Schizoaffective disorder, depressive type: Secondary | ICD-10-CM | POA: Diagnosis not present

## 2020-03-17 DIAGNOSIS — N179 Acute kidney failure, unspecified: Secondary | ICD-10-CM | POA: Diagnosis present

## 2020-03-17 DIAGNOSIS — I10 Essential (primary) hypertension: Secondary | ICD-10-CM | POA: Diagnosis present

## 2020-03-17 DIAGNOSIS — I5033 Acute on chronic diastolic (congestive) heart failure: Secondary | ICD-10-CM

## 2020-03-17 DIAGNOSIS — Z86718 Personal history of other venous thrombosis and embolism: Secondary | ICD-10-CM

## 2020-03-17 DIAGNOSIS — Z79899 Other long term (current) drug therapy: Secondary | ICD-10-CM | POA: Diagnosis not present

## 2020-03-17 DIAGNOSIS — Z20822 Contact with and (suspected) exposure to covid-19: Secondary | ICD-10-CM | POA: Diagnosis present

## 2020-03-17 DIAGNOSIS — I82409 Acute embolism and thrombosis of unspecified deep veins of unspecified lower extremity: Principal | ICD-10-CM

## 2020-03-17 DIAGNOSIS — J690 Pneumonitis due to inhalation of food and vomit: Secondary | ICD-10-CM

## 2020-03-17 DIAGNOSIS — Z8249 Family history of ischemic heart disease and other diseases of the circulatory system: Secondary | ICD-10-CM

## 2020-03-17 DIAGNOSIS — I110 Hypertensive heart disease with heart failure: Secondary | ICD-10-CM | POA: Diagnosis present

## 2020-03-17 DIAGNOSIS — I5032 Chronic diastolic (congestive) heart failure: Secondary | ICD-10-CM | POA: Diagnosis present

## 2020-03-17 DIAGNOSIS — J9621 Acute and chronic respiratory failure with hypoxia: Secondary | ICD-10-CM | POA: Diagnosis present

## 2020-03-17 DIAGNOSIS — F3131 Bipolar disorder, current episode depressed, mild: Secondary | ICD-10-CM | POA: Diagnosis not present

## 2020-03-17 DIAGNOSIS — R7981 Abnormal blood-gas level: Secondary | ICD-10-CM

## 2020-03-17 DIAGNOSIS — Z7901 Long term (current) use of anticoagulants: Secondary | ICD-10-CM | POA: Diagnosis not present

## 2020-03-17 DIAGNOSIS — I480 Paroxysmal atrial fibrillation: Secondary | ICD-10-CM | POA: Diagnosis present

## 2020-03-17 DIAGNOSIS — R0602 Shortness of breath: Secondary | ICD-10-CM | POA: Diagnosis present

## 2020-03-17 LAB — BASIC METABOLIC PANEL
Anion gap: 14 (ref 5–15)
BUN: 20 mg/dL (ref 8–23)
CO2: 22 mmol/L (ref 22–32)
Calcium: 8.8 mg/dL — ABNORMAL LOW (ref 8.9–10.3)
Chloride: 97 mmol/L — ABNORMAL LOW (ref 98–111)
Creatinine, Ser: 1.49 mg/dL — ABNORMAL HIGH (ref 0.61–1.24)
GFR calc Af Amer: 56 mL/min — ABNORMAL LOW (ref >60)
GFR calc non Af Amer: 48 mL/min — ABNORMAL LOW (ref >60)
Glucose, Bld: 264 mg/dL — ABNORMAL HIGH (ref 70–99)
Potassium: 4.9 mmol/L (ref 3.5–5.1)
Sodium: 133 mmol/L — ABNORMAL LOW (ref 135–145)

## 2020-03-17 LAB — BLOOD GAS, ARTERIAL
Acid-Base Excess: 0.5 mmol/L (ref 0.0–2.0)
Allens test (pass/fail): POSITIVE — AB
Bicarbonate: 25.4 mmol/L (ref 20.0–28.0)
FIO2: 1
O2 Saturation: 90.6 %
Patient temperature: 37
pCO2 arterial: 41 mmHg (ref 32.0–48.0)
pH, Arterial: 7.4 (ref 7.350–7.450)
pO2, Arterial: 60 mmHg — ABNORMAL LOW (ref 83.0–108.0)

## 2020-03-17 LAB — CBC
HCT: 48 % (ref 39.0–52.0)
Hemoglobin: 14.5 g/dL (ref 13.0–17.0)
MCH: 25.4 pg — ABNORMAL LOW (ref 26.0–34.0)
MCHC: 30.2 g/dL (ref 30.0–36.0)
MCV: 84.2 fL (ref 80.0–100.0)
Platelets: 167 10*3/uL (ref 150–400)
RBC: 5.7 MIL/uL (ref 4.22–5.81)
RDW: 13.9 % (ref 11.5–15.5)
WBC: 7 10*3/uL (ref 4.0–10.5)
nRBC: 0 % (ref 0.0–0.2)

## 2020-03-17 LAB — RESPIRATORY PANEL BY RT PCR (FLU A&B, COVID)
Influenza A by PCR: NEGATIVE
Influenza B by PCR: NEGATIVE
SARS Coronavirus 2 by RT PCR: NEGATIVE

## 2020-03-17 LAB — GLUCOSE, CAPILLARY
Glucose-Capillary: 197 mg/dL — ABNORMAL HIGH (ref 70–99)
Glucose-Capillary: 239 mg/dL — ABNORMAL HIGH (ref 70–99)
Glucose-Capillary: 251 mg/dL — ABNORMAL HIGH (ref 70–99)
Glucose-Capillary: 236 mg/dL — ABNORMAL HIGH (ref 70–99)

## 2020-03-17 LAB — TROPONIN I (HIGH SENSITIVITY): Troponin I (High Sensitivity): 55 ng/L — ABNORMAL HIGH (ref <18)

## 2020-03-17 MED ORDER — SUCCINYLCHOLINE CHLORIDE 20 MG/ML IJ SOLN
INTRAMUSCULAR | Status: DC | PRN
  Administered 2020-03-17: 150 mg via INTRAVENOUS

## 2020-03-17 MED ORDER — FUROSEMIDE 10 MG/ML IJ SOLN
INTRAMUSCULAR | Status: AC
  Administered 2020-03-17: 20 mg via INTRAVENOUS
  Filled 2020-03-17: qty 2

## 2020-03-17 MED ORDER — DIVALPROEX SODIUM 500 MG PO DR TAB
500.0000 mg | DELAYED_RELEASE_TABLET | Freq: Two times a day (BID) | ORAL | Status: DC
  Administered 2020-03-17 – 2020-03-21 (×8): 500 mg via ORAL
  Filled 2020-03-17 (×9): qty 1

## 2020-03-17 MED ORDER — METOPROLOL SUCCINATE ER 25 MG PO TB24
25.0000 mg | ORAL_TABLET | Freq: Every day | ORAL | Status: DC
  Administered 2020-03-17 – 2020-03-20 (×4): 25 mg via ORAL
  Filled 2020-03-17 (×4): qty 1

## 2020-03-17 MED ORDER — METHOHEXITAL SODIUM 100 MG/10ML IV SOSY
PREFILLED_SYRINGE | INTRAVENOUS | Status: DC | PRN
  Administered 2020-03-17: 100 mg via INTRAVENOUS

## 2020-03-17 MED ORDER — LABETALOL HCL 5 MG/ML IV SOLN
INTRAVENOUS | Status: DC | PRN
  Administered 2020-03-17 (×2): 20 mg via INTRAVENOUS

## 2020-03-17 MED ORDER — SODIUM CHLORIDE 0.9 % IV SOLN: 250.0000 mL | INTRAVENOUS | Status: DC | PRN

## 2020-03-17 MED ORDER — LISINOPRIL 20 MG PO TABS
40.0000 mg | ORAL_TABLET | Freq: Every day | ORAL | Status: DC
  Administered 2020-03-17: 40 mg via ORAL
  Filled 2020-03-17: qty 2

## 2020-03-17 MED ORDER — RIVAROXABAN 20 MG PO TABS
20.0000 mg | ORAL_TABLET | Freq: Every day | ORAL | Status: DC
  Administered 2020-03-17 – 2020-03-20 (×4): 20 mg via ORAL
  Filled 2020-03-17 (×4): qty 1

## 2020-03-17 MED ORDER — LIOTHYRONINE SODIUM 25 MCG PO TABS
25.0000 ug | ORAL_TABLET | Freq: Every day | ORAL | Status: DC
  Administered 2020-03-18 – 2020-03-21 (×4): 25 ug via ORAL
  Filled 2020-03-17 (×4): qty 1

## 2020-03-17 MED ORDER — IOHEXOL 350 MG/ML SOLN
75.0000 mL | Freq: Once | INTRAVENOUS | Status: AC | PRN
  Administered 2020-03-17: 75 mL via INTRAVENOUS

## 2020-03-17 MED ORDER — SODIUM CHLORIDE 0.9% FLUSH
3.0000 mL | Freq: Two times a day (BID) | INTRAVENOUS | Status: DC
  Administered 2020-03-17 – 2020-03-18 (×3): 3 mL via INTRAVENOUS

## 2020-03-17 MED ORDER — AMLODIPINE BESYLATE 10 MG PO TABS: 10.0000 mg | ORAL_TABLET | Freq: Every day | ORAL | Status: DC

## 2020-03-17 MED ORDER — BUPROPION HCL 75 MG PO TABS
75.0000 mg | ORAL_TABLET | Freq: Every day | ORAL | Status: DC
  Administered 2020-03-17 – 2020-03-21 (×5): 75 mg via ORAL
  Filled 2020-03-17 (×5): qty 1

## 2020-03-17 MED ORDER — METHOHEXITAL SODIUM 0.5 G IJ SOLR
INTRAMUSCULAR | Status: AC
  Filled 2020-03-17: qty 500

## 2020-03-17 MED ORDER — MIRTAZAPINE 15 MG PO TBDP
15.0000 mg | ORAL_TABLET | Freq: Every day | ORAL | Status: DC
  Administered 2020-03-17 – 2020-03-20 (×4): 15 mg via ORAL
  Filled 2020-03-17 (×5): qty 1

## 2020-03-17 MED ORDER — ARIPIPRAZOLE 10 MG PO TABS
20.0000 mg | ORAL_TABLET | Freq: Every day | ORAL | Status: DC
  Administered 2020-03-18 – 2020-03-21 (×4): 20 mg via ORAL
  Filled 2020-03-17 (×4): qty 2

## 2020-03-17 MED ORDER — FUROSEMIDE 10 MG/ML IJ SOLN: 20.0000 mg | Freq: Once | INTRAMUSCULAR | Status: AC

## 2020-03-17 MED ORDER — SODIUM CHLORIDE 0.9% FLUSH: 3.0000 mL | INTRAVENOUS | Status: DC | PRN

## 2020-03-17 MED ORDER — ACETAMINOPHEN 325 MG PO TABS
650.0000 mg | ORAL_TABLET | ORAL | Status: DC | PRN
  Administered 2020-03-17 – 2020-03-18 (×2): 650 mg via ORAL
  Filled 2020-03-17 (×2): qty 2

## 2020-03-17 MED ORDER — ATORVASTATIN CALCIUM 10 MG PO TABS
10.0000 mg | ORAL_TABLET | Freq: Every day | ORAL | Status: DC
  Administered 2020-03-17 – 2020-03-20 (×4): 10 mg via ORAL
  Filled 2020-03-17 (×4): qty 1

## 2020-03-17 MED ORDER — FUROSEMIDE 10 MG/ML IJ SOLN
INTRAMUSCULAR | Status: AC
  Filled 2020-03-17: qty 2

## 2020-03-17 MED ORDER — LABETALOL HCL 5 MG/ML IV SOLN
INTRAVENOUS | Status: AC
  Filled 2020-03-17: qty 4

## 2020-03-17 MED ORDER — FUROSEMIDE 10 MG/ML IJ SOLN
40.0000 mg | Freq: Two times a day (BID) | INTRAMUSCULAR | Status: DC
  Administered 2020-03-17: 40 mg via INTRAVENOUS
  Filled 2020-03-17: qty 4

## 2020-03-17 MED ORDER — INSULIN ASPART 100 UNIT/ML ~~LOC~~ SOLN
0.0000 [IU] | Freq: Three times a day (TID) | SUBCUTANEOUS | Status: DC
  Administered 2020-03-17: 7 [IU] via SUBCUTANEOUS
  Administered 2020-03-18: 3 [IU] via SUBCUTANEOUS
  Administered 2020-03-18: 4 [IU] via SUBCUTANEOUS
  Administered 2020-03-19: 3 [IU] via SUBCUTANEOUS
  Administered 2020-03-20: 7 [IU] via SUBCUTANEOUS
  Administered 2020-03-20: 4 [IU] via SUBCUTANEOUS
  Filled 2020-03-17 (×6): qty 1

## 2020-03-17 MED ORDER — ONDANSETRON HCL 4 MG/2ML IJ SOLN: 4.0000 mg | Freq: Once | INTRAMUSCULAR | Status: DC | PRN

## 2020-03-17 MED ORDER — FUROSEMIDE 10 MG/ML IJ SOLN
20.0000 mg | Freq: Once | INTRAMUSCULAR | Status: AC
  Administered 2020-03-17: 20 mg via INTRAVENOUS

## 2020-03-17 MED ORDER — INSULIN ASPART 100 UNIT/ML ~~LOC~~ SOLN
6.0000 [IU] | Freq: Three times a day (TID) | SUBCUTANEOUS | Status: DC
  Administered 2020-03-17 – 2020-03-21 (×11): 6 [IU] via SUBCUTANEOUS
  Filled 2020-03-17 (×11): qty 1

## 2020-03-17 MED ORDER — SODIUM CHLORIDE 0.9 % IV SOLN
500.0000 mL | Freq: Once | INTRAVENOUS | Status: AC
  Administered 2020-03-17: 500 mL via INTRAVENOUS

## 2020-03-17 NOTE — H&P (Signed)
History and Physical    Christian [name] SAY:[id number] DOB: 11-17-1951 DOA: 03/17/2020  PCP: Donald Prose, MD   Patient coming from: Home  I have personally briefly reviewed patient's old medical records in [place]  Chief Complaint: Shortness of breath  HPI: Christian [name] is a 69 y.o. 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  Postprocedure while in while in the recovery room patient became hypoxic requiring a nonrebreather mask to maintain pulse oximetry greater than 92%, he also started coughing up frothy phlegm.  I was asked by the psychiatrist to see the patient. Patient complains of shortness of breath both with exertion and at rest and states that his legs are more swollen than usual.  He denies having any chest pain, diaphoresis, palpitations, dizziness or lightheadedness.  He denies having any headaches, fever or chills. He had a chest x-ray which showed stable mild bilateral hazy densities are noted in both upper lobes which may represent scarring from prior inflammation, although superimposed acute inflammation cannot be excluded. Twelve-lead EKG showed sinus tachycardia  ED Course: Patient was admitted from PACU  Review of Systems: As per HPI otherwise 10 point review of systems negative.    Past Medical History:  Diagnosis Date  . Bilateral lower extremity edema: chronic with venous stasis changes 06/02/2014  . Bipolar 1 disorder ([place])   . Bipolar disorder ([place])   . Chronic kidney disease   . Gout   . Hx of blood clots   . Hypertension   . Mood swings   . OSA on CPAP 06/02/2014  . Other and unspecified hyperlipidemia 06/02/2014  . Type II or unspecified type diabetes mellitus with  unspecified complication, uncontrolled 06/02/2014    No past surgical history on file.   reports that he has never smoked. He has never used smokeless tobacco. He reports that he does not drink alcohol or use drugs.  Allergies  Allergen Reactions  . Zyprexa [Olanzapine] Other (See Comments)    Allergy noted by ACT Team. No reaction specified (??)    Family History  Problem Relation Age of Onset  . Dementia Mother   . Arthritis Father   . Hypertension Father   . Mental illness Sister   . Diabetes Sister   . Heart failure Neg Hx   . Kidney failure Neg Hx   . Cancer Neg Hx      Prior to Admission medications   Medication Sig Start Date End Date Taking? Authorizing Provider  amLODipine (NORVASC) 10 MG tablet Take 1 tablet (10 mg total) by mouth daily. 02/06/20   Starkes-Perry, Gayland Curry, FNP  ARIPiprazole (ABILIFY) 20 MG tablet Take 1 tablet (20 mg total) by mouth daily. 02/06/20   Starkes-Perry, Gayland Curry, FNP  atorvastatin (LIPITOR) 10 MG tablet Take 1 tablet (10 mg total) by mouth daily at 6 PM. 02/05/20   Starkes-Perry, Gayland Curry, FNP  buPROPion (WELLBUTRIN) 75 MG tablet Take 1 tablet (75 mg total) by mouth daily. 02/06/20   Starkes-Perry, Gayland Curry, FNP  divalproex (DEPAKOTE) 500 MG DR tablet Take 1 tablet (500 mg total) by mouth every 12 (twelve) hours. 02/05/20   Starkes-Perry, Gayland Curry, FNP  furosemide (LASIX) 20 MG tablet Take 1 tablet (20 mg total) by mouth daily. 10/24/19   Dorie Rank, MD  glimepiride (AMARYL) 2 MG tablet Take 2 mg by mouth daily with breakfast.    [provider]  liothyronine (CYTOMEL) 25 MCG tablet Take 25 mcg by mouth daily before breakfast.     [provider]  lisinopril (ZESTRIL) 40 MG tablet Take 1 tablet (40 mg total) by mouth daily. 02/06/20   Starkes-Perry, Juel Burrow, FNP  metoprolol succinate (TOPROL-XL) 25 MG 24 hr tablet Take 25 mg by mouth daily.     [provider]  mirtazapine (REMERON SOL-TAB) 15 MG disintegrating tablet Take 1  tablet (15 mg total) by mouth at bedtime. 02/05/20   Starkes-Perry, Juel Burrow, FNP  rivaroxaban (XARELTO) 20 MG TABS tablet Take 20 mg by mouth daily with supper.    [provider]  sitaGLIPtin (JANUVIA) 25 MG tablet Take 25 mg by mouth daily.    [provider]    Physical Exam: Vitals:   03/17/20 1510  SpO2: 92%     Vitals:   03/17/20 1510  SpO2: 92%    Constitutional: NAD, alert and oriented x 3 Eyes: PERRL, lids and conjunctivae normal, pale conjunctiva ENMT: Mucous membranes are moist.  Neck: normal, supple, no masses, no thyromegaly Respiratory: Scattered rales at the bases, no wheezing, no crackles. Normal respiratory effort. No accessory muscle use.  Cardiovascular: Regular rate and rhythm, no murmurs / rubs / gallops. 4+ extremity edema. 2+ pedal pulses. No carotid bruits.  Abdomen: no tenderness, no masses palpated. No hepatosplenomegaly. Bowel sounds positive.  Central adiposity Musculoskeletal: no clubbing / cyanosis. No joint deformity upper and lower extremities.  Skin: no rashes, lesions, ulcers.  Changes related to chronic venous stasis Neurologic: No gross focal neurologic deficit. Psychiatric: Normal mood and affect.   Labs on Admission: I have personally reviewed following labs and imaging studies  CBC: Recent Labs  Lab 03/17/20 1329  WBC 7.0  HGB 14.5  HCT 48.0  MCV 84.2  PLT 167   Basic Metabolic Panel: Recent Labs  Lab 03/17/20 1329  NA 133*  K 4.9  CL 97*  CO2 22  GLUCOSE 264*  BUN 20  CREATININE 1.49*  CALCIUM 8.8*   GFR: Estimated Creatinine Clearance: 67.9 mL/min (A) (by C-G formula based on SCr of 1.49 mg/dL (H)). Liver Function Tests: No results for input(s): AST, ALT, ALKPHOS, BILITOT, PROT, ALBUMIN in the last 168 hours. No results for input(s): LIPASE, AMYLASE in the last 168 hours. No results for input(s): AMMONIA in the last 168 hours. Coagulation Profile: No results for input(s): INR, PROTIME in the last  168 hours. Cardiac Enzymes: No results for input(s): CKTOTAL, CKMB, CKMBINDEX, TROPONINI in the last 168 hours. BNP (last 3 results) No results for input(s): PROBNP in the last 8760 hours. HbA1C: No results for input(s): HGBA1C in the last 72 hours. CBG: Recent Labs  Lab 03/17/20 0927 03/17/20 1127  GLUCAP 197* 239*   Lipid Profile: No results for input(s): CHOL, HDL, LDLCALC, TRIG, CHOLHDL, LDLDIRECT in the last 72 hours. Thyroid Function Tests: No results for input(s): TSH, T4TOTAL, FREET4, T3FREE, THYROIDAB in the last 72 hours. Anemia Panel: No results for input(s): VITAMINB12, FOLATE, FERRITIN, TIBC, IRON, RETICCTPCT in the last 72 hours. Urine analysis:    Component Value Date/Time   COLORURINE YELLOW 11/01/2019 1300   APPEARANCEUR CLEAR 11/01/2019 1300   APPEARANCEUR Cloudy 08/01/2014 1614   LABSPEC 1.010 11/01/2019 1300   LABSPEC 1.008 08/01/2014 1614   PHURINE 5.0 11/01/2019 1300   GLUCOSEU 50 (A) 11/01/2019 1300   GLUCOSEU Negative 08/01/2014  1614   HGBUR SMALL (A) 11/01/2019 1300   BILIRUBINUR NEGATIVE 11/01/2019 1300   BILIRUBINUR Negative 08/01/2014 1614   KETONESUR NEGATIVE 11/01/2019 1300   PROTEINUR NEGATIVE 11/01/2019 1300   UROBILINOGEN 0.2 07/01/2014 1809   NITRITE NEGATIVE 11/01/2019 1300   LEUKOCYTESUR SMALL (A) 11/01/2019 1300   LEUKOCYTESUR Negative 08/01/2014 1614    Radiological Exams on Admission: DG Chest Port 1 View  Result Date: 03/17/2020 CLINICAL DATA:  Hypoxia. EXAM: PORTABLE CHEST 1 VIEW COMPARISON:  December 26, 2019. FINDINGS: The heart size and mediastinal contours are within normal limits. No pneumothorax or pleural effusion is noted. Stable mild bilateral hazy densities are noted in both upper lobes which may represent scarring from prior inflammation, although superimposed acute inflammation cannot be excluded. The visualized skeletal structures are unremarkable. IMPRESSION: Stable mild bilateral hazy densities are noted in both upper  lobes which may represent scarring from prior inflammation, although superimposed acute inflammation cannot be excluded. Electronically Signed   By: Lupita Raider M.D.   On: 03/17/2020 11:58    EKG: Independently reviewed Sinus tachycardia  Assessment/Plan Principal Problem:   Acute on chronic diastolic CHF (congestive heart failure) (HCC) Active Problems:   Hypertension   OSA on CPAP   Bipolar affective disorder, depressed, mild (HCC)   Bilateral pulmonary embolism (HCC)   Acute on chronic respiratory failure with hypoxia (HCC)   Paroxysmal atrial fibrillation (HCC)    Acute on chronic diastolic dysfunction CHF Patient with complaints of shortness of breath with exertion and rest associated with bilateral lower extremity swelling Last 2D echocardiogram which was done in 2019 showed an LVEF of 50 to 55% We will place patient on Lasix 40 mg IV every 12 Continue metoprolol and lisinopril Obtain 2D echocardiogram to assess LVEF    Acute hypoxic respiratory failure Secondary to acute CHF exacerbation Patient remains on a Ventimask to maintain pulse oximetry greater than 92% We will attempt to wean patient off oxygen once his acute exacerbation improves   Hypertension Optimize blood pressure control Continue metoprolol and lisinopril Hold amlodipine for worsening lower extremity swelling   Diabetes mellitus with complications of chronic kidney disease Hold oral hypoglycemic agents Maintain consistent carbohydrate diet Place on sliding scale insulin   Bipolar affective disorder Status post recent electroconvulsive therapy Continue Abilify, Remeron, Depakote and bupropion   Hypothyroidism Continue Synthroid   History of bilateral PE/paroxysmal A. Fib Continue anticoagulation therapy with Xarelto  DVT prophylaxis: Xarelto Code Status: Full code Family Communication: Plan of care was discussed with patient in detail and he verbalizes understanding and agrees with  the plan. Attempted to call wife, left voicemail. Awaiting call back Disposition Plan: Back to previous home environment Consults called: Cardiology    Ammaar Encina MD [facility]     03/17/2020, 3:50 PM

## 2020-03-17 NOTE — Anesthesia Preprocedure Evaluation (Signed)
Anesthesia Evaluation  Patient identified by MRN, date of birth, ID band Patient awake    Reviewed: Allergy & Precautions, NPO status , Patient's Chart, lab work & pertinent test results  History of Anesthesia Complications Negative for: history of anesthetic complications  Airway Mallampati: III  TM Distance: >3 FB Neck ROM: Limited    Dental  (+) Poor Dentition, Missing   Pulmonary shortness of breath and with exertion, sleep apnea and Continuous Positive Airway Pressure Ventilation , neg COPD, neg recent URI,           Cardiovascular Exercise Tolerance: Poor hypertension, Pt. on medications and Pt. on home beta blockers (-) angina+ Peripheral Vascular Disease  (-) Past MI and (-) Cardiac Stents (-) dysrhythmias (-) Valvular Problems/Murmurs  On elequis.   Neuro/Psych PSYCHIATRIC DISORDERS Depression Bipolar Disorder negative neurological ROS     GI/Hepatic Neg liver ROS, neg GERD  ,  Endo/Other  diabetes, Well Controlled, Type 2, Oral Hypoglycemic AgentsBG 88.  Renal/GU CRFRenal disease     Musculoskeletal   Abdominal (+) + obese,   Peds negative pediatric ROS (+)  Hematology   Anesthesia Other Findings   Reproductive/Obstetrics                             Anesthesia Physical  Anesthesia Plan  ASA: III  Anesthesia Plan: General   Post-op Pain Management:    Induction: Intravenous  PONV Risk Score and Plan: 2 and TIVA  Airway Management Planned: Mask  Additional Equipment:   Intra-op Plan:   Post-operative Plan:   Informed Consent: I have reviewed the patients History and Physical, chart, labs and discussed the procedure including the risks, benefits and alternatives for the proposed anesthesia with the patient or authorized representative who has indicated his/her understanding and acceptance.       Plan Discussed with: CRNA  Anesthesia Plan Comments:          Anesthesia Quick Evaluation

## 2020-03-17 NOTE — Anesthesia Postprocedure Evaluation (Signed)
Anesthesia Post Note  Patient: Christian [name]  Procedure(s) Performed: ECT TX  Patient location during evaluation: PACU Anesthesia Type: General Level of consciousness: awake and alert Pain management: pain level controlled Vital Signs Assessment: post-procedure vital signs reviewed and stable Respiratory status: spontaneous breathing and non-rebreather facemask Cardiovascular status: stable Anesthetic complications: no Comments: Pt with decreased saturations immediately post-op. Non-rebreather mask used with improvement of saturations to the upper 80s. CXR order which showed only old haziness in B upper lobes. Pt states he feels like when his lungs have fluid overload. Tx'd with lasix. Some improvement in saturations to mid 90s. Hospitalist consulted and pt admitted to hospital. Will f/u tomorrow.     Last Vitals:  Vitals:   03/17/20 1417 03/17/20 1438  BP:  135/82  Pulse: (!) 112 (!) 115  Resp: (!) 35 (!) 27  Temp: 37.3 C 37.4 C  SpO2: 94% 92%    Last Pain:  Vitals:   03/17/20 1351  TempSrc:   PainSc: 0-No pain                 Christian [name] K

## 2020-03-17 NOTE — Procedures (Signed)
[facility] SERVICES Physician's Interval Evaluation & Treatment Note  Patient Identification: Christian [name] MRN:  [id number] Date of Evaluation:  03/17/2020 TX #: [id number]  MADRS:   MMSE:   P.E. Findings:  Patient is more edematous than usual today.  He usually has significant and even pitting edema in his lower extremities but it is more extreme than usual today.  Otherwise physical exam unremarkable compared to usual.  Psychiatric Interval Note:  Wife and patient reported's been a relatively stable couple weeks  Subjective:  Patient is a 69 y.o. male seen for evaluation for Electroconvulsive Therapy. No specific psychiatric complaints  Treatment Summary:   [x]   Right Unilateral             []  Bilateral   % Energy : 0.3 ms 100%   Impedance: 1010 ohms  Seizure Energy Index: 369 V squared  Postictal Suppression Index: No reading  Seizure Concordance Index: 91%  Medications  Pre Shock: Labetalol 20 mg Brevital 100 mg succinylcholine 150 mg  Post Shock:    Seizure Duration: 36 seconds by movement 78 seconds most likely by EEG   Comments: Anticipate follow-up in 2 weeks.  Lungs:  [x]   Clear to auscultation               []  Other:   Heart:    [x]   Regular rhythm             []  irregular rhythm    [x]   Previous H&P reviewed, patient examined and there are NO CHANGES                 []   Previous H&P reviewed, patient examined and there are changes noted.   , MD 4/19/202111:19 AM

## 2020-03-17 NOTE — Progress Notes (Signed)
CH responded to RR for pt. on 2A; pt. under eval for potential COVID and so COVID PPE required; CH interrupted previous visit to respond to situation and decided to return to finish prior visit and check back w/Pt. once labwork is complete.  CH remains available as needed.    03/17/20 1515  Clinical Encounter Type  Visited With Patient  Visit Type Initial  Referral From Nurse

## 2020-03-17 NOTE — H&P (Signed)
Christian [name] is an 69 y.o. male.   Chief Complaint: Patient seen chart reviewed.  69 year old man with schizoaffective disorder who gets maintenance ECT.  No new complaint today.  Chronic fatigue as usual. HPI: History of recurrent severe depression as part of bipolar or schizoaffective disorder showing good stability with maintenance ECT  Past Medical History:  Diagnosis Date  . Bilateral lower extremity edema: chronic with venous stasis changes 06/02/2014  . Bipolar 1 disorder ([facility])   . Bipolar disorder ([facility])   . Chronic kidney disease   . Gout   . Hx of blood clots   . Hypertension   . Mood swings   . OSA on CPAP 06/02/2014  . Other and unspecified hyperlipidemia 06/02/2014  . Type II or unspecified type diabetes mellitus with unspecified complication, uncontrolled 06/02/2014    History reviewed. No pertinent surgical history.  Family History  Problem Relation Age of Onset  . Dementia Mother   . Arthritis Father   . Hypertension Father   . Mental illness Sister   . Diabetes Sister   . Heart failure Neg Hx   . Kidney failure Neg Hx   . Cancer Neg Hx    Social History:  reports that he has never smoked. He has never used smokeless tobacco. He reports that he does not drink alcohol or use drugs.  Allergies:  Allergies  Allergen Reactions  . Zyprexa [Olanzapine] Other (See Comments)    Allergy noted by ACT Team. No reaction specified (??)    (Not in a hospital admission)   Results for orders placed or performed during the hospital encounter of 03/17/20 (from the past 48 hour(s))  Glucose, capillary     Status: Abnormal   Collection Time: 03/17/20  9:27 AM  Result Value Ref Range   Glucose-Capillary 197 (H) 70 - 99 mg/dL    Comment: Glucose reference range applies only to samples taken after fasting for at least 8 hours.   No results found.  Review of Systems  Constitutional: Negative.   HENT: Negative.   Eyes: Negative.   Respiratory: Negative.   Cardiovascular:  Positive for leg swelling.  Gastrointestinal: Negative.   Musculoskeletal: Negative.   Skin: Negative.   Neurological: Negative.   Psychiatric/Behavioral: Positive for dysphoric mood. Negative for self-injury.    Blood pressure (!) 170/121, pulse (!) 107, temperature 99.1 F (37.3 C), resp. rate (!) 29, height 6' (1.829 m), weight 129.7 kg, SpO2 (!) 84 %. Physical Exam  Nursing note and vitals reviewed. Constitutional: He appears well-developed and well-nourished.  HENT:  Head: Normocephalic and atraumatic.  Eyes: Pupils are equal, round, and reactive to light. Conjunctivae are normal.  Cardiovascular: Normal heart sounds.  Respiratory: Effort normal.  GI: Soft.  Musculoskeletal:        General: Edema present. Normal range of motion.     Cervical back: Normal range of motion.  Neurological: He is alert.  Skin: Skin is warm and dry.  Psychiatric: Judgment normal. His affect is blunt. His speech is delayed. He is slowed. Cognition and memory are impaired. He expresses no homicidal and no suicidal ideation.     Assessment/Plan Patient will receive right unilateral ECT today and plan will be for discharge home and follow-up in 2 weeks.  [name], MD 03/17/2020, 11:17 AM

## 2020-03-17 NOTE — Significant Event (Addendum)
Rapid Response Event Note  Overview: Time Called: 1533 Arrival Time: 1535 Event Type: Respiratory  Initial Focused Assessment:  Rapid response called on this patient for elevated respiratory rate, fever, and tachycardia. Patient has a COVID test pending. Patient is AA+Ox4. Breathing is tachypneic and shallow, but not labored. Lungs are diminished to auscultation.  Interventions:  MD called to bedside. Patient placed on 10L HFNC. Patient given incentive spirometer and coached on use.  Plan of Care (if not transferred):  Patient to receive scheduled home meds. Patient to receive PRN APAP for fever. Bedside RN to continue to encourage IS use. Rapid Response RN to follow-up in 1 hour.  Event Summary: Name of Physician Notified: Dr. Elwyn Lade Agbata at 1541    at    Outcome: Stayed in room and stabalized  Event End Time: 1615  Rosana Fret   Follow-up:  Again rounded on this patient at 1715 hrs. Patient is improving overall. Respiration rate has slowed into the 20s. HFNC titrated down to 8 L. HR still elevated into 110s. Plan is for CTA chest for PE. Bedside RN to continue to encourage IS use and call ARR again if patient's condition worsens.

## 2020-03-17 NOTE — Transfer of Care (Addendum)
Immediate Anesthesia Transfer of Care Note  Patient: Christian [name]  Procedure(s) Performed: ECT TX  Patient Location: PACU  Anesthesia Type:General  Level of Consciousness: awake  Airway & Oxygen Therapy: Patient connected to face mask oxygen  Post-op Assessment: Post -op Vital signs reviewed and stable  Post vital signs: Reviewed    Last Vitals:  Vitals Value Taken Time  BP 170/121 03/17/20 1054  Temp    Pulse 106 03/17/20 1055  Resp 33 03/17/20 1055  SpO2 81 % 03/17/20 1055  Vitals shown include unvalidated device data.  Last Pain:  Vitals:   03/17/20 0910  TempSrc:   PainSc: 0-No pain         Complications: respiratory complications, unplanned hospital admission

## 2020-03-17 NOTE — Anesthesia Procedure Notes (Signed)
Procedure Name: General with mask airway Date/Time: 03/17/2020 10:32 AM Performed by: Irving Burton, CRNA Pre-anesthesia Checklist: Patient identified, Emergency Drugs available, Suction available, Patient being monitored and Timeout performed Patient Re-evaluated:Patient Re-evaluated prior to induction Oxygen Delivery Method: Circle system utilized Preoxygenation: Pre-oxygenation with 100% oxygen Induction Type: IV induction Ventilation: Mask ventilation without difficulty Airway Equipment and Method: Bite block

## 2020-03-18 ENCOUNTER — Inpatient Hospital Stay: Payer: Medicare PPO

## 2020-03-18 ENCOUNTER — Inpatient Hospital Stay
Admission: AD | Admit: 2020-03-18 | Discharge: 2020-03-18 | Disposition: A | Discharge status: Home / Self Care | Payer: Medicare PPO | Source: Ambulatory Visit | Attending: Nurse Practitioner | Admitting: Nurse Practitioner

## 2020-03-18 LAB — BASIC METABOLIC PANEL
Anion gap: 10 (ref 5–15)
BUN: 27 mg/dL — ABNORMAL HIGH (ref 8–23)
CO2: 26 mmol/L (ref 22–32)
Calcium: 8.3 mg/dL — ABNORMAL LOW (ref 8.9–10.3)
Chloride: 98 mmol/L (ref 98–111)
Creatinine, Ser: 1.94 mg/dL — ABNORMAL HIGH (ref 0.61–1.24)
GFR calc Af Amer: 41 mL/min — ABNORMAL LOW (ref >60)
GFR calc non Af Amer: 35 mL/min — ABNORMAL LOW (ref >60)
Glucose, Bld: 143 mg/dL — ABNORMAL HIGH (ref 70–99)
Potassium: 4.3 mmol/L (ref 3.5–5.1)
Sodium: 134 mmol/L — ABNORMAL LOW (ref 135–145)

## 2020-03-18 LAB — GLUCOSE, CAPILLARY
Glucose-Capillary: 121 mg/dL — ABNORMAL HIGH (ref 70–99)
Glucose-Capillary: 180 mg/dL — ABNORMAL HIGH (ref 70–99)

## 2020-03-18 LAB — ECHOCARDIOGRAM COMPLETE
Height: 72 in
Weight: 4401.6 oz

## 2020-03-18 LAB — HIV ANTIBODY (ROUTINE TESTING W REFLEX): HIV Screen 4th Generation wRfx: NONREACTIVE

## 2020-03-18 MED ORDER — SODIUM CHLORIDE 0.9 % IV SOLN
3.0000 g | Freq: Four times a day (QID) | INTRAVENOUS | Status: DC
  Administered 2020-03-18 – 2020-03-20 (×9): 3 g via INTRAVENOUS
  Filled 2020-03-18: qty 3
  Filled 2020-03-18: qty 8
  Filled 2020-03-18: qty 3
  Filled 2020-03-18 (×2): qty 8
  Filled 2020-03-18 (×3): qty 3
  Filled 2020-03-18 (×3): qty 8

## 2020-03-18 MED ORDER — SODIUM CHLORIDE 0.45 % IV SOLN: INTRAVENOUS | Status: DC

## 2020-03-18 NOTE — Progress Notes (Signed)
*  PRELIMINARY RESULTS* Echocardiogram 2D Echocardiogram has been performed.  Cristela Blue 03/18/2020, 2:55 PM

## 2020-03-18 NOTE — Evaluation (Signed)
Clinical/Bedside Swallow Evaluation Patient Details  Name: Christian [name] MRN: [id number] Date of Birth: 08-Aug-1951  Today's Date: 03/18/2020 Time: SLP Start Time (ACUTE ONLY): 1000 SLP Stop Time (ACUTE ONLY): 1030 SLP Time Calculation (min) (ACUTE ONLY): 30 min  Past Medical History:  Past Medical History:  Diagnosis Date  . Bilateral lower extremity edema: chronic with venous stasis changes 06/02/2014  . Bipolar 1 disorder ([facility])   . Bipolar disorder ([place])   . Chronic kidney disease   . Gout   . Hx of blood clots   . Hypertension   . Mood swings   . OSA on CPAP 06/02/2014  . Other and unspecified hyperlipidemia 06/02/2014  . Type II or unspecified type diabetes mellitus with unspecified complication, uncontrolled 06/02/2014   Past Surgical History: History reviewed. No pertinent surgical history. HPI:  Christian [name] is a 69 y.o. 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  Postprocedure while in  the recovery room patient became hypoxic requiring a nonrebreather mask to maintain pulse oximetry greater than 92%, he also started coughing up frothy phlegm. Patient complains of shortness of breath both with exertion and at rest and states that his legs are more swollen than usual.  He had a chest x-ray which showed stable mild bilateral hazy densities are noted in both upper lobes which may represent scarring from prior inflammation, although superimposed acute inflammation cannot be excluded.   Assessment / Plan / Recommendation Clinical Impression  Pt is at reduced risk of aspiration when consuming a regular diet with thin liquids. Pt consumed thin liquids via straw as well as a regular graham crackers and bacon without  any overt s/s of oropharyngeal dysphagia or aspiration. Pt's oral motor exam was negative and his vitals remained within normal linitis throughout assessment. Education provided to pt on general aspiration precautions and pt's nurse was updated on results of this evaluation. Thank you for this consult and if we can be of any further assistance, please let us know. ST to sign off at this time.  SLP Visit Diagnosis: Dysphagia, unspecified (R13.10)    Aspiration Risk  No limitations    Diet Recommendation Regular;Thin liquid   Liquid Administration via: Straw;Cup Medication Administration: Whole meds with liquid Supervision: Patient able to self feed Compensations: Minimize environmental distractions;Slow rate;Small sips/bites Postural Changes: Seated upright at 90 degrees    Other  Recommendations Oral Care Recommendations: Oral care BID   Follow up Recommendations None      Frequency and Duration   N/A         Prognosis   N/A     Swallow Study   General Date of Onset: 03/17/20 HPI: Christian [name] is a 69 y.o. 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  Postprocedure while in  the recovery room patient became hypoxic requiring a nonrebreather mask to maintain pulse oximetry greater than 92%, he also started coughing up frothy phlegm. Patient complains of shortness of breath both with exertion and at rest and states that his legs are more swollen than usual.  He had a chest x-ray which showed  stable mild bilateral hazy densities are noted in both upper lobes which may represent scarring from prior inflammation, although superimposed acute inflammation cannot be excluded. Type of Study: Bedside Swallow  Evaluation Previous Swallow Assessment: none in chart Diet Prior to this Study: Regular;Thin liquids Temperature Spikes Noted: Yes Respiratory Status: Nasal cannula History of Recent Intubation: No Behavior/Cognition: Alert;Cooperative;Pleasant mood Oral Cavity Assessment: Within Functional Limits Oral Care Completed by SLP: No Oral Cavity - Dentition: Adequate natural dentition Vision: Functional for self-feeding Self-Feeding Abilities: Able to feed self Patient Positioning: Upright in bed Baseline Vocal Quality: Normal Volitional Cough: Strong Volitional Swallow: Able to elicit    Oral/Motor/Sensory Function Overall Oral Motor/Sensory Function: Within functional limits   Ice Chips Ice chips: Not tested   Thin Liquid Thin Liquid: Within functional limits Presentation: Cup;Self Fed;Straw    Nectar Thick Nectar Thick Liquid: Not tested   Honey Thick Honey Thick Liquid: Not tested   Puree Puree: Within functional limits Presentation: Self Fed;Spoon   Solid     Solid: Within functional limits Presentation: Self Fed      Tanna Loeffler 03/18/2020,11:24 AM

## 2020-03-18 NOTE — Progress Notes (Signed)
PROGRESS NOTE    Krzysztof Reichelt Hue  YWV:[id number] DOB: December 24, 1950 DOA: 03/17/2020 PCP: [name], MD    Brief Narrative:  Christian [name] is a 69 y.o. 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  Postprocedure while in while in the recovery room patient became hypoxic requiring a nonrebreather mask to maintain pulse oximetry greater than 92%, he also started coughing up frothy phlegm.  I was asked by the psychiatrist to see the patient. Patient complains of shortness of breath both with exertion and at rest and states that his legs are more swollen than usual.  He denies having any chest pain, diaphoresis, palpitations, dizziness or lightheadedness.  He denies having any headaches, fever or chills. He had a chest x-ray which showed stable mild bilateral hazy densities are noted in both upper lobes which may represent scarring from prior inflammation, although superimposed acute inflammation cannot be excluded. Twelve-lead EKG showed sinus tachycardia  ED Course: Patient was admitted from PACU    Consultants:     Procedures:   Antimicrobials:   Unasyn   Subjective: Feels his shortness of breath and breathing is getting better.  T-max 100.5  Objective: Vitals:   03/18/20 0528 03/18/20 0558 03/18/20 0600 03/18/20 0643  BP:      Pulse:   95   Resp:      Temp:      TempSrc:      SpO2: 98% 95%  96%  Weight:      Height:        Intake/Output Summary (Last 24 hours) at 03/18/2020 0820 Last data filed at 03/18/2020 0032 Gross per 24 hour  Intake 540 ml  Output 775 ml  Net -235 ml   Filed Weights   03/18/20 0418  Weight: 124.8 kg    Examination:  General exam: Appears calm and comfortable doing  in bed eating Respiratory system: Rales at bases, no wheezing Cardiovascular system: S1 & S2 heard, RRR. No JVD, murmurs, rubs, gallops or clicks.  Gastrointestinal system: Abdomen is nondistended, soft and nontender. Normal bowel sounds heard. Central nervous system: Alert and oriented.  Grossly intact Extremities: No edema Skin: Warm dry Psychiatry:Mood & affect appropriate in current setting.     Data Reviewed: I have personally reviewed following labs and imaging studies  CBC: Recent Labs  Lab 03/17/20 1329  WBC 7.0  HGB 14.5  HCT 48.0  MCV 84.2  PLT 167   Basic Metabolic Panel: Recent Labs  Lab 03/17/20 1329 03/18/20 0455  NA 133* 134*  K 4.9 4.3  CL 97* 98  CO2 22 26  GLUCOSE 264* 143*  BUN 20 27*  CREATININE 1.49* 1.94*  CALCIUM 8.8* 8.3*   GFR: Estimated Creatinine Clearance: 51.1 mL/min (A) (by C-G formula based on SCr of 1.94 mg/dL (H)). Liver Function Tests: No results for input(s): AST, ALT, ALKPHOS, BILITOT, PROT, ALBUMIN in the last 168 hours. No results for input(s): LIPASE, AMYLASE in the last 168 hours. No results for input(s): AMMONIA in the last 168 hours. Coagulation Profile: No results for input(s): INR, PROTIME in the last 168 hours. Cardiac Enzymes: No results for input(s): CKTOTAL, CKMB, CKMBINDEX, TROPONINI in the last 168 hours. BNP (last 3 results) No results for input(s): PROBNP in the last 8760 hours. HbA1C: No results for input(s): HGBA1C  in the last 72 hours. CBG: Recent Labs  Lab 03/17/20 0927 03/17/20 1127 03/17/20 1711 03/17/20 2034  GLUCAP 197* 239* 251* 236*   Lipid Profile: No results for input(s): CHOL, HDL, LDLCALC, TRIG, CHOLHDL, LDLDIRECT in the last 72 hours. Thyroid Function Tests: No results for input(s): TSH, T4TOTAL, FREET4, T3FREE, THYROIDAB in the last 72 hours. Anemia Panel: No results for input(s): VITAMINB12, FOLATE, FERRITIN, TIBC, IRON, RETICCTPCT in the last 72 hours. Sepsis Labs: No results for  input(s): PROCALCITON, LATICACIDVEN in the last 168 hours.  Recent Results (from the past 240 hour(s))  SARS CORONAVIRUS 2 (TAT 6-24 HRS) Nasopharyngeal Nasopharyngeal Swab     Status: None   Collection Time: 03/14/20 12:29 PM   Specimen: Nasopharyngeal Swab  Result Value Ref Range Status   SARS Coronavirus 2 NEGATIVE NEGATIVE Final    Comment: (NOTE) SARS-CoV-2 target nucleic acids are NOT DETECTED. The SARS-CoV-2 RNA is generally detectable in upper and lower respiratory specimens during the acute phase of infection. Negative results do not preclude SARS-CoV-2 infection, do not rule out co-infections with other pathogens, and should not be used as the sole basis for treatment or other patient management decisions. Negative results must be combined with clinical observations, patient history, and epidemiological information. The expected result is Negative. Fact Sheet for Patients: HairSlick.no Fact Sheet for Healthcare Providers: quierodirigir.com This test is not yet approved or cleared by the [place] FDA and  has been authorized for detection and/or diagnosis of SARS-CoV-2 by FDA under an Emergency Use Authorization (EUA). This EUA will remain  in effect (meaning this test can be used) for the duration of the COVID-19 declaration under Section 56 4(b)(1) of the Act, 21 U.S.C. section 360bbb-3(b)(1), unless the authorization is terminated or revoked sooner. Performed at [facility], [address], [place], [place] [address]   Respiratory Panel by RT PCR (Flu A&B, Covid) - Nasopharyngeal Swab     Status: None   Collection Time: 03/17/20  4:55 PM   Specimen: Nasopharyngeal Swab  Result Value Ref Range Status   SARS Coronavirus 2 by RT PCR NEGATIVE NEGATIVE Final    Comment: (NOTE) SARS-CoV-2 target nucleic acids are NOT DETECTED. The SARS-CoV-2 RNA is generally detectable in upper respiratoy specimens during  the acute phase of infection. The lowest concentration of SARS-CoV-2 viral copies this assay can detect is 131 copies/mL. A negative result does not preclude SARS-Cov-2 infection and should not be used as the sole basis for treatment or other patient management decisions. A negative result may occur with  improper specimen collection/handling, submission of specimen other than nasopharyngeal swab, presence of viral mutation(s) within the areas targeted by this assay, and inadequate number of viral copies (<131 copies/mL). A negative result must be combined with clinical observations, patient history, and epidemiological information. The expected result is Negative. Fact Sheet for Patients:  https://www.moore.com/ Fact Sheet for Healthcare Providers:  https://www.young.biz/ This test is not yet ap proved or cleared by the [place] FDA and  has been authorized for detection and/or diagnosis of SARS-CoV-2 by FDA under an Emergency Use Authorization (EUA). This EUA will remain  in effect (meaning this test can be used) for the duration of the COVID-19 declaration under Section 564(b)(1) of the Act, 21 U.S.C. section 360bbb-3(b)(1), unless the authorization is terminated or revoked sooner.    Influenza A by PCR NEGATIVE NEGATIVE Final   Influenza B by PCR NEGATIVE NEGATIVE Final    Comment: (NOTE) The Xpert Xpress SARS-CoV-2/FLU/RSV assay is  intended as an aid in  the diagnosis of influenza from Nasopharyngeal swab specimens and  should not be used as a sole basis for treatment. Nasal washings and  aspirates are unacceptable for Xpert Xpress SARS-CoV-2/FLU/RSV  testing. Fact Sheet for Patients: PinkCheek.be Fact Sheet for Healthcare Providers: GravelBags.it This test is not yet approved or cleared by the [place] FDA and  has been authorized for detection and/or diagnosis of  SARS-CoV-2 by  FDA under an Emergency Use Authorization (EUA). This EUA will remain  in effect (meaning this test can be used) for the duration of the  Covid-19 declaration under Section 564(b)(1) of the Act, 21  U.S.C. section 360bbb-3(b)(1), unless the authorization is  terminated or revoked. Performed at [facility], [address] [place], [place] [address]   CULTURE, BLOOD (ROUTINE X 2) w Reflex to ID Panel     Status: None (Preliminary result)   Collection Time: 03/17/20  5:35 PM   Specimen: BLOOD  Result Value Ref Range Status   Specimen Description BLOOD LAC  Final   Special Requests   Final    BOTTLES DRAWN AEROBIC AND ANAEROBIC Blood Culture adequate volume   Culture   Final    NO GROWTH < 24 HOURS Performed at [facility], [address] [place], [place] [address]    Report Status PENDING  Incomplete  CULTURE, BLOOD (ROUTINE X 2) w Reflex to ID Panel     Status: None (Preliminary result)   Collection Time: 03/17/20  8:41 PM   Specimen: BLOOD  Result Value Ref Range Status   Specimen Description BLOOD R HAND  Final   Special Requests   Final    BOTTLES DRAWN AEROBIC AND ANAEROBIC Blood Culture adequate volume   Culture   Final    NO GROWTH < 12 HOURS Performed at [facility], [address], [place],  [address]    Report Status PENDING  Incomplete         Radiology Studies: CT ANGIO CHEST PE W OR WO CONTRAST  Result Date: 03/17/2020 CLINICAL DATA:  Shortness of breath and leg swelling after electroconvulsive therapy. EXAM: CT ANGIOGRAPHY CHEST WITH CONTRAST TECHNIQUE: Multidetector CT imaging of the chest was performed using the standard protocol during bolus administration of intravenous contrast. Multiplanar CT image reconstructions and MIPs were obtained to evaluate the vascular anatomy. CONTRAST:  23mL OMNIPAQUE IOHEXOL 350 MG/ML SOLN COMPARISON:  Chest x-ray from same day. CT chest dated August 29, 2018. CTA chest  dated September 02, 2015. FINDINGS: Cardiovascular: Satisfactory opacification of the pulmonary arteries to the segmental level. No evidence of pulmonary embolism. Normal heart size. No pericardial effusion. No thoracic aortic aneurysm or dissection. Mild aortic arch and severe three-vessel coronary artery atherosclerotic calcification. Mediastinum/Nodes: No enlarged mediastinal, hilar, or axillary lymph nodes. Mildly patulous esophagus. Thyroid gland and trachea demonstrate no significant findings. Lungs/Pleura: Patchy dependent consolidation and ground-glass densities in the right greater than left upper lobes, right middle lobe, and both lower lobes. No pleural effusion or pneumothorax. Upper Abdomen: No acute abnormality. Musculoskeletal: No chest wall abnormality. No acute or significant osseous findings. Review of the MIP images confirms the above findings. IMPRESSION: 1. No evidence of pulmonary embolism. 2. Extensive multifocal aspiration pneumonia. 3. Aortic Atherosclerosis (ICD10-I70.0). Electronically Signed   By: Titus Dubin M.D.   On: 03/17/2020 20:33   DG Chest Port 1 View  Result Date: 03/17/2020 CLINICAL DATA:  Hypoxia. EXAM: PORTABLE CHEST 1 VIEW COMPARISON:  December 26, 2019.  FINDINGS: The heart size and mediastinal contours are within normal limits. No pneumothorax or pleural effusion is noted. Stable mild bilateral hazy densities are noted in both upper lobes which may represent scarring from prior inflammation, although superimposed acute inflammation cannot be excluded. The visualized skeletal structures are unremarkable. IMPRESSION: Stable mild bilateral hazy densities are noted in both upper lobes which may represent scarring from prior inflammation, although superimposed acute inflammation cannot be excluded. Electronically Signed   By: Lupita Raider M.D.   On: 03/17/2020 11:58        Scheduled Meds: . ARIPiprazole  20 mg Oral Daily  . atorvastatin  10 mg Oral q1800  .  buPROPion  75 mg Oral Daily  . divalproex  500 mg Oral Q12H  . insulin aspart  0-20 Units Subcutaneous TID WC  . insulin aspart  6 Units Subcutaneous TID WC  . liothyronine  25 mcg Oral QAC breakfast  . metoprolol succinate  25 mg Oral Daily  . mirtazapine  15 mg Oral QHS  . rivaroxaban  20 mg Oral Q supper  . sodium chloride flush  3 mL Intravenous Q12H   Continuous Infusions: . sodium chloride    . ampicillin-sulbactam (UNASYN) IV      Assessment & Plan:   Principal Problem:   Acute on chronic diastolic CHF (congestive heart failure) (HCC) Active Problems:   Hypertension   OSA on CPAP   Bipolar affective disorder, depressed, mild (HCC)   Bilateral pulmonary embolism (HCC)   Acute on chronic respiratory failure with hypoxia (HCC)   Paroxysmal atrial fibrillation (HCC)   Acute on chronic diastolic dysfunction CHF Patient with complaints of shortness of breath with exertion and rest associated with bilateral lower extremity swelling Last 2D echocardiogram which was done in 2019 showed an LVEF of 50 to 55% Euvolemic on exam.  Will d/c lasix as creatinine increased Continue metoprolol Dc lisinopril Echo with EF 50-%55%    Acute hypoxic respiratory failure- not from chf, likely from asp. pna found on cta cta neg for PE. Secondary to acute CHF exacerbation Patient was  on a Ventimask to maintain pulse oximetry greater than 92%, now weaned to [facility] Continue with antibiotic treatment   Aspiration pneumonia-continue with Unasyn   AKI- worsened after lasix.  Will give gentle hydration Monitor vol status and renal function  Hypertension Optimize blood pressure control Continue metoprolol  Hold amlodipine for worsening lower extremity swelling Hold acei for aki   Diabetes mellitus with complications of chronic kidney disease Hold oral hypoglycemic agents Maintain consistent carbohydrate diet Place on sliding scale insulin   Bipolar affective  disorder Status post recent electroconvulsive therapy Continue Abilify, Remeron, Depakote and bupropion   Hypothyroidism Continue Synthroid   History of bilateral PE/paroxysmal A. Fib Continue anticoagulation therapy with Xarelto  DVT prophylaxis: Xarelto Code Status: Full code Family Communication: none at bedside Disposition Plan: Back to previous home environment Barrier: needs iv abx , renal function worse, need ivf for hydration possible d/c in 1-2 days       LOS: 1 day   Time spent: 45 minutes with more than 50% COC    Lynn Ito, MD [facility] Pager [phone]  If 7PM-7AM, please contact night-coverage [email or web address] Password [email or web address] 03/18/2020, 8:20 AM

## 2020-03-18 NOTE — Consult Note (Signed)
Christian [name] is a 69 y.o. male  [id number]  Primary Cardiologist: Adrian Blackwater Reason for Consultation: SOB  HPI: Patient is a 69 year old African-American male with past medical history of hypertension, HFpEF, atrial fibrillation, diabetes with CKD, obstructive sleep apnea, chronic venous stasis, bipolar disorder treated with ECT.  Patient had completed electroconvulsive therapy and in the PACU patient became hypoxic requiring a nonrebreather.  Patient had stated that he was having shortness of breath at home prior to the procedure and that his legs have been more swollen than usual.  Patient has since been admitted and is currently requiring 3 L nasal cannula.   Review of Systems: Unable to perform as patient was off the unit.   Past Medical History:  Diagnosis Date  . Bilateral lower extremity edema: chronic with venous stasis changes 06/02/2014  . Bipolar 1 disorder (HCC)   . Bipolar disorder (HCC)   . Chronic kidney disease   . Gout   . Hx of blood clots   . Hypertension   . Mood swings   . OSA on CPAP 06/02/2014  . Other and unspecified hyperlipidemia 06/02/2014  . Type II or unspecified type diabetes mellitus with unspecified complication, uncontrolled 06/02/2014    Medications Prior to Admission  Medication Sig Dispense Refill  . amLODipine (NORVASC) 10 MG tablet Take 1 tablet (10 mg total) by mouth daily. 30 tablet 0  . ARIPiprazole (ABILIFY) 20 MG tablet Take 1 tablet (20 mg total) by mouth daily. 30 tablet 0  . atorvastatin (LIPITOR) 10 MG tablet Take 1 tablet (10 mg total) by mouth daily at 6 PM. 30 tablet 0  . buPROPion (WELLBUTRIN) 75 MG tablet Take 1 tablet (75 mg total) by mouth daily. 30 tablet 0  . divalproex (DEPAKOTE) 500 MG DR tablet Take 1 tablet (500 mg total) by mouth every 12 (twelve) hours. 60 tablet 0  . furosemide (LASIX) 20 MG tablet Take 1 tablet (20 mg total) by mouth daily. 30 tablet 0  . glimepiride (AMARYL) 2 MG tablet Take 2 mg by mouth daily with  breakfast.    . liothyronine (CYTOMEL) 25 MCG tablet Take 25 mcg by mouth daily before breakfast.     . lisinopril (ZESTRIL) 40 MG tablet Take 1 tablet (40 mg total) by mouth daily. 30 tablet 0  . metoprolol succinate (TOPROL-XL) 25 MG 24 hr tablet Take 25 mg by mouth daily.     . mirtazapine (REMERON SOL-TAB) 15 MG disintegrating tablet Take 1 tablet (15 mg total) by mouth at bedtime. 30 tablet 0  . rivaroxaban (XARELTO) 20 MG TABS tablet Take 20 mg by mouth daily with supper.    . sitaGLIPtin (JANUVIA) 25 MG tablet Take 25 mg by mouth daily.       . ARIPiprazole  20 mg Oral Daily  . atorvastatin  10 mg Oral q1800  . buPROPion  75 mg Oral Daily  . divalproex  500 mg Oral Q12H  . insulin aspart  0-20 Units Subcutaneous TID WC  . insulin aspart  6 Units Subcutaneous TID WC  . liothyronine  25 mcg Oral QAC breakfast  . metoprolol succinate  25 mg Oral Daily  . mirtazapine  15 mg Oral QHS  . rivaroxaban  20 mg Oral Q supper  . sodium chloride flush  3 mL Intravenous Q12H    Infusions: . sodium chloride    . ampicillin-sulbactam (UNASYN) IV 3 g (03/18/20 7253)    Allergies  Allergen Reactions  . Zyprexa [Olanzapine]  Other (See Comments)    Allergy noted by ACT Team. No reaction specified (??)    Social History   Socioeconomic History  . Marital status: Divorced    Spouse name: Not on file  . Number of children: Not on file  . Years of education: Not on file  . Highest education level: Not on file  Occupational History  . Not on file  Tobacco Use  . Smoking status: Never Smoker  . Smokeless tobacco: Never Used  Substance and Sexual Activity  . Alcohol use: No  . Drug use: No  . Sexual activity: Not Currently  Other Topics Concern  . Not on file  Social History Narrative   *The patient is divorced and is currently on disability. He lives alone and has one daughter.             Social Determinants of Health   Financial Resource Strain:   . Difficulty of Paying  Living Expenses:   Food Insecurity:   . Worried About Programme researcher, broadcasting/film/video in the Last Year:   . Barista in the Last Year:   Transportation Needs:   . Freight forwarder (Medical):   Marland Kitchen Lack of Transportation (Non-Medical):   Physical Activity:   . Days of Exercise per Week:   . Minutes of Exercise per Session:   Stress:   . Feeling of Stress :   Social Connections:   . Frequency of Communication with Friends and Family:   . Frequency of Social Gatherings with Friends and Family:   . Attends Religious Services:   . Active Member of Clubs or Organizations:   . Attends Banker Meetings:   Marland Kitchen Marital Status:   Intimate Partner Violence:   . Fear of Current or Ex-Partner:   . Emotionally Abused:   Marland Kitchen Physically Abused:   . Sexually Abused:     Family History  Problem Relation Age of Onset  . Dementia Mother   . Arthritis Father   . Hypertension Father   . Mental illness Sister   . Diabetes Sister   . Heart failure Neg Hx   . Kidney failure Neg Hx   . Cancer Neg Hx     PHYSICAL EXAM: Vitals:   03/18/20 0600 03/18/20 0643  BP:    Pulse: 95   Resp:    Temp:    SpO2:  96%     Intake/Output Summary (Last 24 hours) at 03/18/2020 1057 Last data filed at 03/18/2020 1010 Gross per 24 hour  Intake 540 ml  Output 1050 ml  Net -510 ml     ECG: Sinus tachycardia with mild ST elevation suggesting previous anteroseptal infarct.  106/bpm  Results for orders placed or performed during the hospital encounter of 03/17/20 (from the past 24 hour(s))  Respiratory Panel by RT PCR (Flu A&B, Covid) - Nasopharyngeal Swab     Status: None   Collection Time: 03/17/20  4:55 PM   Specimen: Nasopharyngeal Swab  Result Value Ref Range   SARS Coronavirus 2 by RT PCR NEGATIVE NEGATIVE   Influenza A by PCR NEGATIVE NEGATIVE   Influenza B by PCR NEGATIVE NEGATIVE  Glucose, capillary     Status: Abnormal   Collection Time: 03/17/20  5:11 PM  Result Value Ref Range    Glucose-Capillary 251 (H) 70 - 99 mg/dL  CULTURE, BLOOD (ROUTINE X 2) w Reflex to ID Panel     Status: None (Preliminary result)   Collection Time: 03/17/20  5:35 PM   Specimen: BLOOD  Result Value Ref Range   Specimen Description BLOOD LAC    Special Requests      BOTTLES DRAWN AEROBIC AND ANAEROBIC Blood Culture adequate volume   Culture      NO GROWTH < 24 HOURS Performed at [facility], [address] [place], [place] [address]    Report Status PENDING   Glucose, capillary     Status: Abnormal   Collection Time: 03/17/20  8:34 PM  Result Value Ref Range   Glucose-Capillary 236 (H) 70 - 99 mg/dL  CULTURE, BLOOD (ROUTINE X 2) w Reflex to ID Panel     Status: None (Preliminary result)   Collection Time: 03/17/20  8:41 PM   Specimen: BLOOD  Result Value Ref Range   Specimen Description BLOOD R HAND    Special Requests      BOTTLES DRAWN AEROBIC AND ANAEROBIC Blood Culture adequate volume   Culture      NO GROWTH < 12 HOURS Performed at [facility], [address] [place], [place] [address]    Report Status PENDING   Basic metabolic panel     Status: Abnormal   Collection Time: 03/18/20  4:55 AM  Result Value Ref Range   Sodium 134 (L) 135 - 145 mmol/L   Potassium 4.3 3.5 - 5.1 mmol/L   Chloride 98 98 - 111 mmol/L   CO2 26 22 - 32 mmol/L   Glucose, Bld 143 (H) 70 - 99 mg/dL   BUN 27 (H) 8 - 23 mg/dL   Creatinine, Ser 8.24 (H) 0.61 - 1.24 mg/dL   Calcium 8.3 (L) 8.9 - 10.3 mg/dL   GFR calc non Af Amer 35 (L) >60 mL/min   GFR calc Af Amer 41 (L) >60 mL/min   Anion gap 10 5 - 15   CT ANGIO CHEST PE W OR WO CONTRAST  Result Date: 03/17/2020 CLINICAL DATA:  Shortness of breath and leg swelling after electroconvulsive therapy. EXAM: CT ANGIOGRAPHY CHEST WITH CONTRAST TECHNIQUE: Multidetector CT imaging of the chest was performed using the standard protocol during bolus administration of intravenous contrast. Multiplanar CT image reconstructions and MIPs  were obtained to evaluate the vascular anatomy. CONTRAST:  5mL OMNIPAQUE IOHEXOL 350 MG/ML SOLN COMPARISON:  Chest x-ray from same day. CT chest dated August 29, 2018. CTA chest dated September 02, 2015. FINDINGS: Cardiovascular: Satisfactory opacification of the pulmonary arteries to the segmental level. No evidence of pulmonary embolism. Normal heart size. No pericardial effusion. No thoracic aortic aneurysm or dissection. Mild aortic arch and severe three-vessel coronary artery atherosclerotic calcification. Mediastinum/Nodes: No enlarged mediastinal, hilar, or axillary lymph nodes. Mildly patulous esophagus. Thyroid gland and trachea demonstrate no significant findings. Lungs/Pleura: Patchy dependent consolidation and ground-glass densities in the right greater than left upper lobes, right middle lobe, and both lower lobes. No pleural effusion or pneumothorax. Upper Abdomen: No acute abnormality. Musculoskeletal: No chest wall abnormality. No acute or significant osseous findings. Review of the MIP images confirms the above findings. IMPRESSION: 1. No evidence of pulmonary embolism. 2. Extensive multifocal aspiration pneumonia. 3. Aortic Atherosclerosis (ICD10-I70.0). Electronically Signed   By: Obie Dredge M.D.   On: 03/17/2020 20:33   US Venous Img Lower Bilateral (DVT)  Result Date: 03/18/2020 CLINICAL DATA:  69 year old male with pitting edema EXAM: BILATERAL LOWER EXTREMITY VENOUS DOPPLER ULTRASOUND TECHNIQUE: Gray-scale sonography with graded compression, as well as color Doppler and duplex ultrasound were performed to evaluate the lower extremity deep venous systems  from the level of the common femoral vein and including the common femoral, femoral, profunda femoral, popliteal and calf veins including the posterior tibial, peroneal and gastrocnemius veins when visible. The superficial great saphenous vein was also interrogated. Spectral Doppler was utilized to evaluate flow at rest and with distal  augmentation maneuvers in the common femoral, femoral and popliteal veins. COMPARISON:  None. FINDINGS: RIGHT LOWER EXTREMITY Common Femoral Vein: No evidence of thrombus. Normal compressibility, respiratory phasicity and response to augmentation. Saphenofemoral Junction: No evidence of thrombus. Normal compressibility and flow on color Doppler imaging. Profunda Femoral Vein: No evidence of thrombus. Normal compressibility and flow on color Doppler imaging. Femoral Vein: No evidence of thrombus. Normal compressibility, respiratory phasicity and response to augmentation. Popliteal Vein: No evidence of thrombus. Normal compressibility, respiratory phasicity and response to augmentation. Calf Veins: No thrombus of the posterior tibial vein. Peroneal vein not visualized. Superficial Great Saphenous Vein: No evidence of thrombus. Normal compressibility and flow on color Doppler imaging. Other Findings:  Mild edema LEFT LOWER EXTREMITY Common Femoral Vein: No evidence of thrombus. Normal compressibility, respiratory phasicity and response to augmentation. Saphenofemoral Junction: No evidence of thrombus. Normal compressibility and flow on color Doppler imaging. Profunda Femoral Vein: No evidence of thrombus. Normal compressibility and flow on color Doppler imaging. Femoral Vein: No evidence of thrombus. Normal compressibility, respiratory phasicity and response to augmentation. Popliteal Vein: No evidence of thrombus. Normal compressibility, respiratory phasicity and response to augmentation. Calf Veins: No thrombus of the posterior tibial vein. Peroneal vein not visualized. Superficial Great Saphenous Vein: No evidence of thrombus. Normal compressibility and flow on color Doppler imaging. Other Findings:  Mild edema IMPRESSION: Sonographic survey of the bilateral lower extremity negative for DVT. Electronically Signed   By: Corrie Mckusick D.O.   On: 03/18/2020 08:49   DG Chest Port 1 View  Result Date:  03/17/2020 CLINICAL DATA:  Hypoxia. EXAM: PORTABLE CHEST 1 VIEW COMPARISON:  December 26, 2019. FINDINGS: The heart size and mediastinal contours are within normal limits. No pneumothorax or pleural effusion is noted. Stable mild bilateral hazy densities are noted in both upper lobes which may represent scarring from prior inflammation, although superimposed acute inflammation cannot be excluded. The visualized skeletal structures are unremarkable. IMPRESSION: Stable mild bilateral hazy densities are noted in both upper lobes which may represent scarring from prior inflammation, although superimposed acute inflammation cannot be excluded. Electronically Signed   By: Marijo Conception M.D.   On: 03/17/2020 11:58     ASSESSMENT AND PLAN: Patient admitted to the hospital with most likely acute on chronic HFpEF with respiratory distress after electroconvulsive therapy. Respiratory distress most likely induced by aspiration pneumonia as evident on chest x-ray. We will repeat echocardiogram and adjust medications appropriately.  Blood pressure current soft. Please continue to hold home doses of lisinopril and amlodipine. Please continue metoprolol succinate 25 mg daily.  Patient is currently in normal sinus rhythm and rate controlled, please continue his home dose of Xarelto.   We will continue to monitor and assist in optimizing patient's current HFpEF.   Adaline Sill NP-C

## 2020-03-18 NOTE — Progress Notes (Signed)
MEWS score elevated throughout shift, between 2-3. Provider aware. ST on tele. Low grade temp present. PRN tylenol given this am w/ results. On HFNC 8L O2 at start of shift. Weaning O2 this am, saturations >95%. Denies any pain or SOB. Evident LE pitting edema. IV diuresis per order. Voiding appropriately. CT complete.

## 2020-03-18 NOTE — Consult Note (Signed)
Pharmacy Antibiotic Note  Christian [name] is a 69 y.o. male admitted on 03/17/2020 with aspiration pneumonia .  Pharmacy has been consulted for Unasyn dosing. Patient recently had an ECT and afterwards became hypoxic. Chest imaging indicate extensive multifocal aspiration pneumonia.   Plan: 1. Unasyn 3 g Q6H   Height: 6' (182.9 cm) Weight: 124.8 kg (275 lb 1.6 oz) IBW/kg (Calculated) : 77.6  Temp (24hrs), Avg:99.4 F (37.4 C), Min:98.1 F (36.7 C), Max:101.7 F (38.7 C)  Recent Labs  Lab 03/17/20 1329 03/18/20 0455  WBC 7.0  --   CREATININE 1.49* 1.94*    Estimated Creatinine Clearance: 51.1 mL/min (A) (by C-G formula based on SCr of 1.94 mg/dL (H)).    Allergies  Allergen Reactions  . Zyprexa [Olanzapine] Other (See Comments)    Allergy noted by ACT Team. No reaction specified (??)    Antimicrobials this admission: 4/20 Unasyn >>    Microbiology results:  4/19 BCx: NGTD 4/19  COVID: Negative   Thank you for allowing pharmacy to be a part of this patient's care.  Katha Cabal 03/18/2020 8:11 AM

## 2020-03-18 NOTE — Plan of Care (Signed)
  Problem: Education: Goal: Knowledge of General Education information will improve Description: Including pain rating scale, medication(s)/side effects and non-pharmacologic comfort measures Outcome: Progressing   Problem: Health Behavior/Discharge Planning: Goal: Ability to manage health-related needs will improve Outcome: Progressing Note: Patient now on IV Unasyn Q 6 hours. Remains on Xarelto. IV Lasix has been d/c'd. Had an ECHO and a scan for a BLE DVT today. Will continue to monitor overall progression for the remainder of the shift. Jari Favre [facility]

## 2020-03-18 NOTE — Progress Notes (Signed)
Patient off unit for testing. Will resume care upon return. Christian [name] Christian [name]  

## 2020-03-19 DIAGNOSIS — L89152 Pressure ulcer of sacral region, stage 2: Secondary | ICD-10-CM

## 2020-03-19 DIAGNOSIS — F3131 Bipolar disorder, current episode depressed, mild: Secondary | ICD-10-CM

## 2020-03-19 DIAGNOSIS — J9601 Acute respiratory failure with hypoxia: Secondary | ICD-10-CM

## 2020-03-19 DIAGNOSIS — I480 Paroxysmal atrial fibrillation: Secondary | ICD-10-CM

## 2020-03-19 DIAGNOSIS — I5032 Chronic diastolic (congestive) heart failure: Secondary | ICD-10-CM

## 2020-03-19 LAB — BASIC METABOLIC PANEL
Anion gap: 7 (ref 5–15)
BUN: 32 mg/dL — ABNORMAL HIGH (ref 8–23)
CO2: 28 mmol/L (ref 22–32)
Calcium: 8.3 mg/dL — ABNORMAL LOW (ref 8.9–10.3)
Chloride: 100 mmol/L (ref 98–111)
Creatinine, Ser: 1.78 mg/dL — ABNORMAL HIGH (ref 0.61–1.24)
GFR calc Af Amer: 45 mL/min — ABNORMAL LOW (ref >60)
GFR calc non Af Amer: 39 mL/min — ABNORMAL LOW (ref >60)
Glucose, Bld: 160 mg/dL — ABNORMAL HIGH (ref 70–99)
Potassium: 4.5 mmol/L (ref 3.5–5.1)
Sodium: 135 mmol/L (ref 135–145)

## 2020-03-19 LAB — GLUCOSE, CAPILLARY
Glucose-Capillary: 126 mg/dL — ABNORMAL HIGH (ref 70–99)
Glucose-Capillary: 116 mg/dL — ABNORMAL HIGH (ref 70–99)
Glucose-Capillary: 112 mg/dL — ABNORMAL HIGH (ref 70–99)
Glucose-Capillary: 142 mg/dL — ABNORMAL HIGH (ref 70–99)

## 2020-03-19 LAB — BRAIN NATRIURETIC PEPTIDE: B Natriuretic Peptide: 65 pg/mL (ref 0.0–100.0)

## 2020-03-19 MED ORDER — SODIUM CHLORIDE 0.9 % IV SOLN: INTRAVENOUS | Status: DC

## 2020-03-19 MED ORDER — LISINOPRIL 10 MG PO TABS
10.0000 mg | ORAL_TABLET | Freq: Every day | ORAL | Status: DC
  Administered 2020-03-19 – 2020-03-20 (×2): 10 mg via ORAL
  Filled 2020-03-19 (×2): qty 1

## 2020-03-19 NOTE — Evaluation (Signed)
Physical Therapy Evaluation Patient Details Name: Christian [name] MRN: [id number] DOB: 01/03/1951 Today's Date: 03/19/2020   History of Present Illness  Per MD notes: Pt is a 69 y.o. 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  Postprocedure while in while in the recovery room patient became hypoxic requiring a nonrebreather mask to maintain pulse oximetry greater than 92%, he also started coughing up frothy phlegm.  MD assessment includes: Acute respiratory failure with hypoxia secondarily to aspiration pneumonia, AKI, DM II, bipolar affective disorder, hypothyroidism, and h/o bilateral PE.    Clinical Impression  Pt pleasant and motivated to participate during the session.  Pt put forth good effort throughout the session and ultimately did not require physical assistance with any functional task.  Pt required extra time and effort with bed mobility and transfers and once in standing began to feel mildly "lightheaded".  Pt returned to sitting with BP 146/85, nursing notified.  Pt's SpO2 and HR WNL during the session.  Pt will benefit from HHPT services upon discharge to safely address deficits listed in patient problem list for decreased caregiver assistance and eventual return to PLOF.      Follow Up Recommendations Home health PT    Equipment Recommendations  None recommended by PT    Recommendations for Other Services       Precautions / Restrictions Precautions Precautions: Fall Restrictions Weight Bearing Restrictions: No      Mobility  Bed Mobility Overal bed mobility: Modified Independent             General bed mobility comments: Extra time and effort  Transfers Overall transfer level: Needs  assistance Equipment used: Rolling walker (2 wheeled) Transfers: Sit to/from Stand Sit to Stand: Min guard;From elevated surface         General transfer comment: Fair concentric and eccentric control and stability with min verbal cues for hand placement  Ambulation/Gait Ambulation/Gait assistance: Min guard Gait Distance (Feet): 5 Feet Assistive device: Rolling walker (2 wheeled) Gait Pattern/deviations: Step-through pattern;Decreased step length - right;Decreased step length - left Gait velocity: decreased   General Gait Details: Amb distance limited secondary to pt feeling "lightheaded" in standing; BP taken once back in sitting at 146/85, nursing notified  Stairs            Wheelchair Mobility    Modified Rankin (Stroke Patients Only)       Balance Overall balance assessment: Needs assistance   Sitting balance-Leahy Scale: Good     Standing balance support: Bilateral upper extremity supported Standing balance-Leahy Scale: Good                               Pertinent Vitals/Pain Pain Assessment: No/denies pain    Home Living Family/patient expects to be discharged to:: Private residence Living Arrangements: Alone Available Help at Discharge: Family;Available PRN/intermittently Type of Home: Apartment Home Access: Level entry     Home Layout: One level Home Equipment: Walker - 4 wheels;Bedside commode;Shower seat Additional Comments: Bed rail    Prior Function Level of Independence: Needs assistance   Gait / Transfers Assistance Needed: Ind amb mostly without an AD limited community distances with occasional use of a rollator when fatigued, no fall history, Mod Ind with bed mob with  a rail and Ind with transfers  ADL's / Homemaking Assistance Needed: Daughter visits patient daily and assists with bathing, dressing, errands, and groceries        Hand Dominance        Extremity/Trunk Assessment   Upper Extremity Assessment Upper  Extremity Assessment: Overall WFL for tasks assessed    Lower Extremity Assessment Lower Extremity Assessment: Generalized weakness       Communication   Communication: No difficulties  Cognition Arousal/Alertness: Awake/alert Behavior During Therapy: WFL for tasks assessed/performed Overall Cognitive Status: Within Functional Limits for tasks assessed                                        General Comments      Exercises Total Joint Exercises Ankle Circles/Pumps: Strengthening;Both;5 reps;10 reps Quad Sets: Strengthening;Both;5 reps;10 reps Gluteal Sets: Strengthening;Both;5 reps;10 reps Long Arc Quad: Strengthening;Both;10 reps Knee Flexion: Strengthening;Both;10 reps Marching in Standing: AROM;Both;10 reps;Standing   Assessment/Plan    PT Assessment Patient needs continued PT services  PT Problem List Decreased strength;Decreased activity tolerance;Decreased balance;Decreased mobility       PT Treatment Interventions DME instruction;Gait training;Functional mobility training;Therapeutic activities;Therapeutic exercise;Balance training;Patient/family education    PT Goals (Current goals can be found in the Care Plan section)  Acute Rehab PT Goals Patient Stated Goal: To get stronger PT Goal Formulation: With patient Time For Goal Achievement: 04/01/20 Potential to Achieve Goals: Good    Frequency Min 2X/week   Barriers to discharge        Co-evaluation               AM-PAC PT "6 Clicks" Mobility  Outcome Measure Help needed turning from your back to your side while in a flat bed without using bedrails?: A Little Help needed moving from lying on your back to sitting on the side of a flat bed without using bedrails?: A Little Help needed moving to and from a bed to a chair (including a wheelchair)?: A Little Help needed standing up from a chair using your arms (e.g., wheelchair or bedside chair)?: A Little Help needed to walk in hospital  room?: A Little Help needed climbing 3-5 steps with a railing? : A Little 6 Click Score: 18    End of Session Equipment Utilized During Treatment: Gait belt Activity Tolerance: Other (comment)(Pt limited by feeling "lightheaded" in standing) Patient left: in chair;with call bell/phone within reach Nurse Communication: Mobility status;Other (comment)(Pt lightheaded in standing) PT Visit Diagnosis: Muscle weakness (generalized) (M62.81);Difficulty in walking, not elsewhere classified (R26.2)    Time: 3536-1443 PT Time Calculation (min) (ACUTE ONLY): 34 min   Charges:   PT Evaluation $PT Eval Moderate Complexity: 1 Mod PT Treatments $Therapeutic Exercise: 8-22 mins        D. Royetta Asal PT, DPT 03/19/20, 3:33 PM

## 2020-03-19 NOTE — Progress Notes (Signed)
Medications administered by Nori Riis Student Nurse were supervised by this RN Forestine Macho A Kanasia Gayman

## 2020-03-19 NOTE — Progress Notes (Signed)
SUBJECTIVE: Patient resting comfortably in bed.  Patient states his short of breath has vastly improved and denies chest pain.    Vitals:   03/19/20 0300 03/19/20 0359 03/19/20 0726 03/19/20 0730  BP:  130/89 (!) 160/92   Pulse:  86 94   Resp: (!) 24 (!) 22 (!) 1 19  Temp:  98.5 F (36.9 C) 98.5 F (36.9 C)   TempSrc:  Oral Oral   SpO2:  94% 95%   Weight:  124.4 kg    Height:        Intake/Output Summary (Last 24 hours) at 03/19/2020 0943 Last data filed at 03/19/2020 0658 Gross per 24 hour  Intake 1858.2 ml  Output 2900 ml  Net -1041.8 ml    LABS: Basic Metabolic Panel: Recent Labs    03/18/20 0455 03/19/20 0616  NA 134* 135  K 4.3 4.5  CL 98 100  CO2 26 28  GLUCOSE 143* 160*  BUN 27* 32*  CREATININE 1.94* 1.78*  CALCIUM 8.3* 8.3*   Liver Function Tests: No results for input(s): AST, ALT, ALKPHOS, BILITOT, PROT, ALBUMIN in the last 72 hours. No results for input(s): LIPASE, AMYLASE in the last 72 hours. CBC: Recent Labs    03/17/20 1329  WBC 7.0  HGB 14.5  HCT 48.0  MCV 84.2  PLT 167   Cardiac Enzymes: No results for input(s): CKTOTAL, CKMB, CKMBINDEX, TROPONINI in the last 72 hours. BNP: Invalid input(s): POCBNP D-Dimer: No results for input(s): DDIMER in the last 72 hours. Hemoglobin A1C: No results for input(s): HGBA1C in the last 72 hours. Fasting Lipid Panel: No results for input(s): CHOL, HDL, LDLCALC, TRIG, CHOLHDL, LDLDIRECT in the last 72 hours. Thyroid Function Tests: No results for input(s): TSH, T4TOTAL, T3FREE, THYROIDAB in the last 72 hours.  Invalid input(s): FREET3 Anemia Panel: No results for input(s): VITAMINB12, FOLATE, FERRITIN, TIBC, IRON, RETICCTPCT in the last 72 hours.   PHYSICAL EXAM General: Well developed, well nourished, in no acute distress HEENT:  Normocephalic and atramatic Neck:  No JVD.  Lungs: Clear bilaterally to auscultation and percussion. Heart: HRRR . Normal S1 and S2 without gallops or murmurs.   Abdomen: Bowel sounds are positive, abdomen soft and non-tender  Msk:  Back normal, normal gait. Normal strength and tone for age. Extremities: No clubbing, cyanosis or edema.   Neuro: Alert and oriented X 3. Psych:  Good affect, responds appropriately  TELEMETRY: NSR. 90s/bpm  ASSESSMENT AND PLAN: Patient admitted to the hospital with acute on chronic HFpEF with respiratory distress vs PNA after electroconvulsive therapy. Respiratory status has improved as patient is only on 1L Normal.  Echocardiogram shows EF 50 to 55%, left ventricular global hypokinesis with grade 1 diastolic dysfunction, right ventricle moderately enlarged, moderate MR/AS.  Blood pressure mildly elevated this morning.  Patient's current AKI improving and most likely prerenal d/t diuretics, therefore will resume low dose lisinopril and continue metoprolol succinate 25 mg daily.  Patient is currently in normal sinus rhythm and rate controlled, please continue his home dose of Xarelto.   We will continue to monitor and assist in optimizing patient's current HFpEF.  Active Problems:   Hypertension   OSA on CPAP   Bipolar affective disorder, depressed, mild (HCC)   Bilateral pulmonary embolism (HCC)   Acute respiratory failure with hypoxia (HCC)   Paroxysmal atrial fibrillation (HCC)   Chronic diastolic CHF (congestive heart failure) (HCC)   Sacral decubitus ulcer, stage II (HCC)    Maryelizabeth Kaufmann, NP-C 03/19/2020 9:43 AM

## 2020-03-19 NOTE — Progress Notes (Signed)
[facility] PROGRESS NOTE    Progress Note  Christian [name]  OZH:[id number] DOB: [birth year] DOA: 03/17/2020 PCP: [name], MD     Brief Narrative:   Christian [name] is an 69 y.o. male past medical history of bipolar disorder who apparently is only teens electroconvulsive therapy, history of chronic venous stasis hypertension diabetes mellitus sleep apnea was recently seen by a psychiatrist was given his treatment of electroconvulsive therapy and postprocedural recovery room became hypoxic and to be placed on a nonrebreather.  Patient was complaining of shortness of breath on exertion and his legs were swollen he denies any chest pain diaphoresis palpitation dizziness or lightheadedness.  Chest x-ray done showed mild bilateral infiltrates Assessment/Plan:   Acute respiratory failure with hypoxia secondarily to aspiration pneumonia: 2D echo showed a preserved EF with diastolic grade 1 heart failure. Apparently on admission he was euvolemic diuretics were stopped, he was continued on metoprolol and lisinopril was DC'd. I believe is unlikely that his infiltrate seen on CT is due to heart failure will order a BNP. He was started on a Ventimask on admission to keep saturations greater than 96% now weaned to nasal cannula, I am more concerned about aspiration pneumonia we will continue IV empiric antibiotics.  Acute kidney injury: With a baseline creatinine of around 1, increased to 1.4 likely due to diuretics. Diuretics were stopped, he has no JVD, but does have 3+ lower extremity edema. We will place TED hose.  Diabetes mellitus type 2: Oral hypoglycemic agents were held on admission, continue sliding scale insulin.  Bipolar affective disorder: Continue Abilify Remeron Depakote and bupropion.  Hypothyroidism: Continue Synthroid.  History of bilateral PE/paroxysmal atrial fibrillation: Continue Xarelto no changes.  Sacral decubitus ulcer stage II present on admission RN  Pressure Injury Documentation: Pressure Injury 08/04/18 Stage II -  Partial thickness loss of dermis presenting as a shallow open ulcer with a red, pink wound bed without slough. (Active)  08/04/18 2253  Location: Buttocks  Location Orientation: Left;Right  Staging: Stage II -  Partial thickness loss of dermis presenting as a shallow open ulcer with a red, pink wound bed without slough.  Wound Description (Comments):   Present on Admission: Yes    Estimated body mass index is 37.19 kg/m as calculated from the following:   Height as of this encounter: 6' (1.829 m).   Weight as of this encounter: 124.4 kg.   DVT prophylaxis: Xarelto Family Communication:none Status is: Inpatient  Remains inpatient appropriate because:Hemodynamically unstable   Dispo: The patient is from: Home              Anticipated d/c is to: Home              Anticipated d/c date is: 2 days              Patient currently is not medically stable to d/c. Code Status:     Code Status Orders  (From admission, onward)         Start     Ordered   03/17/20 1546  Full code  Continuous     03/17/20 1548        Code Status History    Date Active Date Inactive Code Status Order ID Comments User Context   01/30/2020 1857 02/05/2020 2106 Full Code [id number]  Audery Amel, MD Inpatient   10/31/2019 1840 11/02/2019 1855 Full Code [id number]  Melene Plan, DO ED   08/01/2018 1754 08/04/2018 1200 Full Code [id number]  Plunkett,  Alphonzo Lemmings, MD ED   06/17/2018 1700 06/23/2018 1808 Full Code [id number]  Lahoma Crocker, MD ED   03/27/2018 0325 03/31/2018 1753 Full Code [id number]  Eduard Clos, MD Inpatient   12/22/2015 1337 02/03/2016 1950 Full Code [id number]  Audery Amel, MD Inpatient   12/22/2015 0830 12/22/2015 1337 Full Code [id number]  Audery Amel, MD ED   09/05/2015 2043 09/18/2015 1817 Full Code [id number]  Clapacs, Jackquline Denmark, MD Inpatient   08/31/2015 1702 09/05/2015 2030 Full Code [id number]  Gracelyn Nurse, MD Inpatient    08/28/2015 2019 08/31/2015 1702 Full Code [id number]  Audery Amel, MD Inpatient   07/29/2015 2028 08/27/2015 1932 Full Code [id number]  Houston Siren, MD Inpatient   07/16/2015 1213 07/29/2015 2028 Full Code [id number]  Shari Prows, MD Inpatient   07/15/2015 1751 07/16/2015 1213 Full Code [id number]  Beau Fanny, MD Inpatient   07/14/2015 1913 07/15/2015 1751 Full Code [id number]  Beau Fanny, MD Inpatient   07/11/2015 1703 07/14/2015 1913 Full Code [id number]  Altamese Dilling, MD Inpatient   07/09/2015 2313 07/11/2015 1703 Full Code [id number]  Dwan Bolt, RN Inpatient   06/28/2015 2306 06/30/2015 1134 Full Code [id number]  Milta Deiters ED   06/21/2014 1533 07/16/2014 1824 Full Code [id number]  [name], NP Inpatient   06/19/2014 1454 06/21/2014 1533 Full Code [id number]  Penny Pia, MD Inpatient   06/17/2014 0545 06/19/2014 1454 Full Code [id number]  Warnell Forester, MD ED   05/25/2014 0455 06/05/2014 1827 Full Code [id number]  Kristeen Mans, NP Inpatient   05/24/2014 0408 05/25/2014 0455 Full Code [id number]  Dammen, Concha Norway ED   Advance Care Planning Activity        IV Access:    Peripheral IV   Procedures and diagnostic studies:   CT ANGIO CHEST PE W OR WO CONTRAST  Result Date: 03/17/2020 CLINICAL DATA:  Shortness of breath and leg swelling after electroconvulsive therapy. EXAM: CT ANGIOGRAPHY CHEST WITH CONTRAST TECHNIQUE: Multidetector CT imaging of the chest was performed using the standard protocol during bolus administration of intravenous contrast. Multiplanar CT image reconstructions and MIPs were obtained to evaluate the vascular anatomy. CONTRAST:  75mL OMNIPAQUE IOHEXOL 350 MG/ML SOLN COMPARISON:  Chest x-ray from same day. CT chest dated August 29, 2018. CTA chest dated September 02, 2015. FINDINGS: Cardiovascular: Satisfactory opacification of the pulmonary arteries to the segmental level. No evidence of pulmonary embolism. Normal heart size. No  pericardial effusion. No thoracic aortic aneurysm or dissection. Mild aortic arch and severe three-vessel coronary artery atherosclerotic calcification. Mediastinum/Nodes: No enlarged mediastinal, hilar, or axillary lymph nodes. Mildly patulous esophagus. Thyroid gland and trachea demonstrate no significant findings. Lungs/Pleura: Patchy dependent consolidation and ground-glass densities in the right greater than left upper lobes, right middle lobe, and both lower lobes. No pleural effusion or pneumothorax. Upper Abdomen: No acute abnormality. Musculoskeletal: No chest wall abnormality. No acute or significant osseous findings. Review of the MIP images confirms the above findings. IMPRESSION: 1. No evidence of pulmonary embolism. 2. Extensive multifocal aspiration pneumonia. 3. Aortic Atherosclerosis (ICD10-I70.0). Electronically Signed   By: Obie Dredge M.D.   On: 03/17/2020 20:33   US Venous Img Lower Bilateral (DVT)  Result Date: 03/18/2020 CLINICAL DATA:  68 year old male with pitting edema EXAM: BILATERAL LOWER EXTREMITY VENOUS DOPPLER ULTRASOUND TECHNIQUE: Gray-scale sonography with graded compression, as well as color Doppler and duplex ultrasound were performed to evaluate the lower extremity deep venous systems from the level of the common femoral  vein and including the common femoral, femoral, profunda femoral, popliteal and calf veins including the posterior tibial, peroneal and gastrocnemius veins when visible. The superficial great saphenous vein was also interrogated. Spectral Doppler was utilized to evaluate flow at rest and with distal augmentation maneuvers in the common femoral, femoral and popliteal veins. COMPARISON:  None. FINDINGS: RIGHT LOWER EXTREMITY Common Femoral Vein: No evidence of thrombus. Normal compressibility, respiratory phasicity and response to augmentation. Saphenofemoral Junction: No evidence of thrombus. Normal compressibility and flow on color Doppler imaging.  Profunda Femoral Vein: No evidence of thrombus. Normal compressibility and flow on color Doppler imaging. Femoral Vein: No evidence of thrombus. Normal compressibility, respiratory phasicity and response to augmentation. Popliteal Vein: No evidence of thrombus. Normal compressibility, respiratory phasicity and response to augmentation. Calf Veins: No thrombus of the posterior tibial vein. Peroneal vein not visualized. Superficial Great Saphenous Vein: No evidence of thrombus. Normal compressibility and flow on color Doppler imaging. Other Findings:  Mild edema LEFT LOWER EXTREMITY Common Femoral Vein: No evidence of thrombus. Normal compressibility, respiratory phasicity and response to augmentation. Saphenofemoral Junction: No evidence of thrombus. Normal compressibility and flow on color Doppler imaging. Profunda Femoral Vein: No evidence of thrombus. Normal compressibility and flow on color Doppler imaging. Femoral Vein: No evidence of thrombus. Normal compressibility, respiratory phasicity and response to augmentation. Popliteal Vein: No evidence of thrombus. Normal compressibility, respiratory phasicity and response to augmentation. Calf Veins: No thrombus of the posterior tibial vein. Peroneal vein not visualized. Superficial Great Saphenous Vein: No evidence of thrombus. Normal compressibility and flow on color Doppler imaging. Other Findings:  Mild edema IMPRESSION: Sonographic survey of the bilateral lower extremity negative for DVT. Electronically Signed   By: Corrie Mckusick D.O.   On: 03/18/2020 08:49   DG Chest Port 1 View  Result Date: 03/17/2020 CLINICAL DATA:  Hypoxia. EXAM: PORTABLE CHEST 1 VIEW COMPARISON:  December 26, 2019. FINDINGS: The heart size and mediastinal contours are within normal limits. No pneumothorax or pleural effusion is noted. Stable mild bilateral hazy densities are noted in both upper lobes which may represent scarring from prior inflammation, although superimposed acute  inflammation cannot be excluded. The visualized skeletal structures are unremarkable. IMPRESSION: Stable mild bilateral hazy densities are noted in both upper lobes which may represent scarring from prior inflammation, although superimposed acute inflammation cannot be excluded. Electronically Signed   By: Marijo Conception M.D.   On: 03/17/2020 11:58   ECHOCARDIOGRAM COMPLETE  Result Date: 03/18/2020    ECHOCARDIOGRAM REPORT   Patient Name:   KOEN ANTILLA Date of Exam: 03/18/2020 Medical Rec #:  [id number]        Height:       72.0 in Accession #:    [id number]       Weight:       275.1 lb Date of Birth:  Jan 09, 1951        BSA:          2.440 m Patient Age:    34 years         BP:           99/66 mmHg Patient Gender: M                HR:           93 bpm. Exam Location:  [facility] Procedure: 2D Echo, Cardiac Doppler and Color Doppler Indications:     CHF- acute diastolic 448.18  History:         Patient  has prior history of Echocardiogram examinations, most                  recent 06/19/2018. Risk Factors:Diabetes, Hypertension and Sleep                  Apnea. Bipolar 1 disorder.  Sonographer:     Cristela Blue RDCS (AE) Referring Phys:  [id number] Caryn Bee PACKER Diagnosing Phys: Adrian Blackwater MD  Sonographer Comments: Technically difficult study due to poor echo windows. IMPRESSIONS  1. Left ventricular ejection fraction, by estimation, is 50 to 55%. The left ventricle has low normal function. The left ventricle demonstrates global hypokinesis. Left ventricular diastolic parameters are consistent with Grade I diastolic dysfunction (impaired relaxation).  2. Right ventricular systolic function is mildly reduced. The right ventricular size is moderately enlarged. There is normal pulmonary artery systolic pressure.  3. Left atrial size was mild to moderately dilated.  4. Right atrial size was mild to moderately dilated.  5. The mitral valve is degenerative. Mild to moderate mitral valve regurgitation. No evidence of mitral  stenosis.  6. Supraaortic stenosis. The aortic valve is abnormal. Aortic valve regurgitation is mild. Mild to moderate aortic valve stenosis.  7. The inferior vena cava is normal in size with greater than 50% respiratory variability, suggesting right atrial pressure of 3 mmHg. FINDINGS  Left Ventricle: Left ventricular ejection fraction, by estimation, is 50 to 55%. The left ventricle has low normal function. The left ventricle demonstrates global hypokinesis. The left ventricular internal cavity size was normal in size. There is no left ventricular hypertrophy. Left ventricular diastolic parameters are consistent with Grade I diastolic dysfunction (impaired relaxation). Right Ventricle: The right ventricular size is moderately enlarged. No increase in right ventricular wall thickness. Right ventricular systolic function is mildly reduced. There is normal pulmonary artery systolic pressure. The tricuspid regurgitant velocity is 1.75 m/s, and with an assumed right atrial pressure of 10 mmHg, the estimated right ventricular systolic pressure is 22.2 mmHg. Left Atrium: Left atrial size was mild to moderately dilated. Right Atrium: Right atrial size was mild to moderately dilated. Pericardium: There is no evidence of pericardial effusion. Mitral Valve: The mitral valve is degenerative in appearance. There is severe calcification of the mitral valve leaflet(s). Moderately decreased mobility of the mitral valve leaflets. Moderate to severe mitral annular calcification. Mild to moderate mitral valve regurgitation. No evidence of mitral valve stenosis. Tricuspid Valve: The tricuspid valve is normal in structure. Tricuspid valve regurgitation is mild . No evidence of tricuspid stenosis. Aortic Valve: Supraaortic stenosis. The aortic valve is abnormal. Aortic valve regurgitation is mild. Mild to moderate aortic stenosis is present. Aortic valve mean gradient measures 2.3 mmHg. Aortic valve peak gradient measures 4.6 mmHg.  Aortic valve area, by VTI measures 2.31 cm. Pulmonic Valve: The pulmonic valve was normal in structure. Pulmonic valve regurgitation is mild. No evidence of pulmonic stenosis. Aorta: The aortic root is normal in size and structure. Venous: The inferior vena cava is normal in size with greater than 50% respiratory variability, suggesting right atrial pressure of 3 mmHg. IAS/Shunts: No atrial level shunt detected by color flow Doppler.  LEFT VENTRICLE PLAX 2D LVIDd:         4.88 cm  Diastology LVIDs:         3.56 cm  LV e' lateral:   14.00 cm/s LV PW:         1.24 cm  LV E/e' lateral: 4.8 LV IVS:  1.60 cm  LV e' medial:    5.66 cm/s LVOT diam:     2.00 cm  LV E/e' medial:  11.8 LV SV:         35 LV SV Index:   15 LVOT Area:     3.14 cm  RIGHT VENTRICLE RV Basal diam:  3.83 cm RV S prime:     15.90 cm/s TAPSE (M-mode): 2.5 cm LEFT ATRIUM           Index       RIGHT ATRIUM           Index LA diam:      4.20 cm 1.72 cm/m  RA Area:     14.40 cm LA Vol (A2C): 29.2 ml 11.97 ml/m RA Volume:   30.50 ml  12.50 ml/m LA Vol (A4C): 42.6 ml 17.46 ml/m  AORTIC VALVE AV Area (Vmax):    1.61 cm AV Area (Vmean):   1.48 cm AV Area (VTI):     2.31 cm AV Vmax:           107.00 cm/s AV Vmean:          69.133 cm/s AV VTI:            0.154 m AV Peak Grad:      4.6 mmHg AV Mean Grad:      2.3 mmHg LVOT Vmax:         54.70 cm/s LVOT Vmean:        32.600 cm/s LVOT VTI:          0.113 m LVOT/AV VTI ratio: 0.73  AORTA Ao Root diam: 3.10 cm MITRAL VALVE               TRICUSPID VALVE MV Area (PHT): 3.06 cm    TR Peak grad:   12.2 mmHg MV Decel Time: 248 msec    TR Vmax:        175.00 cm/s MV E velocity: 66.90 cm/s MV A velocity: 95.60 cm/s  SHUNTS MV E/A ratio:  0.70        Systemic VTI:  0.11 m                            Systemic Diam: 2.00 cm Adrian Blackwater MD Electronically signed by Adrian Blackwater MD Signature Date/Time: 03/18/2020/4:06:35 PM    Final      Medical Consultants:    None.  Anti-Infectives:    none  Subjective:    Christian [name] he relates his breathing is improved, he continues to have a persistent cough.  Objective:    Vitals:   03/19/20 0300 03/19/20 0359 03/19/20 0726 03/19/20 0730  BP:  130/89 (!) 160/92   Pulse:  86 94   Resp: (!) 24 (!) 22 (!) 1 19  Temp:  98.5 F (36.9 C) 98.5 F (36.9 C)   TempSrc:  Oral Oral   SpO2:  94% 95%   Weight:  124.4 kg    Height:       SpO2: 95 % O2 Flow Rate (L/min): 1 L/min FiO2 (%): 55 %   Intake/Output Summary (Last 24 hours) at 03/19/2020 0810 Last data filed at 03/19/2020 0658 Gross per 24 hour  Intake 1858.2 ml  Output 2900 ml  Net -1041.8 ml   Filed Weights   03/18/20 0418 03/19/20 0359  Weight: 124.8 kg 124.4 kg    Exam: General exam: In no acute distress. Respiratory system:  Good air movement and clear to auscultation. Cardiovascular system: S1 & S2 heard, RRR.  No appreciated JVD Gastrointestinal system: Abdomen is nondistended, soft and nontender.  Central nervous system: Alert and oriented. No focal neurological deficits. Extremities: 3+ edema Skin: No rashes, lesions or ulcers  Data Reviewed:    Labs: Basic Metabolic Panel: Recent Labs  Lab 03/17/20 1329 03/17/20 1329 03/18/20 0455 03/19/20 0616  NA 133*  --  134* 135  K 4.9   < > 4.3 4.5  CL 97*  --  98 100  CO2 22  --  26 28  GLUCOSE 264*  --  143* 160*  BUN 20  --  27* 32*  CREATININE 1.49*  --  1.94* 1.78*  CALCIUM 8.8*  --  8.3* 8.3*   < > = values in this interval not displayed.   GFR Estimated Creatinine Clearance: 55.6 mL/min (A) (by C-G formula based on SCr of 1.78 mg/dL (H)). Liver Function Tests: No results for input(s): AST, ALT, ALKPHOS, BILITOT, PROT, ALBUMIN in the last 168 hours. No results for input(s): LIPASE, AMYLASE in the last 168 hours. No results for input(s): AMMONIA in the last 168 hours. Coagulation profile No results for input(s): INR, PROTIME in the last 168 hours. COVID-19 Labs  No results for  input(s): DDIMER, FERRITIN, LDH, CRP in the last 72 hours.  Lab Results  Component Value Date   SARSCOV2NAA NEGATIVE 03/17/2020   SARSCOV2NAA NEGATIVE 03/14/2020   SARSCOV2NAA NEGATIVE 02/29/2020   SARSCOV2NAA NEGATIVE 02/15/2020    CBC: Recent Labs  Lab 03/17/20 1329  WBC 7.0  HGB 14.5  HCT 48.0  MCV 84.2  PLT 167   Cardiac Enzymes: No results for input(s): CKTOTAL, CKMB, CKMBINDEX, TROPONINI in the last 168 hours. BNP (last 3 results) No results for input(s): PROBNP in the last 8760 hours. CBG: Recent Labs  Lab 03/17/20 1127 03/17/20 1711 03/17/20 2034 03/18/20 0926 03/18/20 1141  GLUCAP 239* 251* 236* 121* 180*   D-Dimer: No results for input(s): DDIMER in the last 72 hours. Hgb A1c: No results for input(s): HGBA1C in the last 72 hours. Lipid Profile: No results for input(s): CHOL, HDL, LDLCALC, TRIG, CHOLHDL, LDLDIRECT in the last 72 hours. Thyroid function studies: No results for input(s): TSH, T4TOTAL, T3FREE, THYROIDAB in the last 72 hours.  Invalid input(s): FREET3 Anemia work up: No results for input(s): VITAMINB12, FOLATE, FERRITIN, TIBC, IRON, RETICCTPCT in the last 72 hours. Sepsis Labs: Recent Labs  Lab 03/17/20 1329  WBC 7.0   Microbiology Recent Results (from the past 240 hour(s))  SARS CORONAVIRUS 2 (TAT 6-24 HRS) Nasopharyngeal Nasopharyngeal Swab     Status: None   Collection Time: 03/14/20 12:29 PM   Specimen: Nasopharyngeal Swab  Result Value Ref Range Status   SARS Coronavirus 2 NEGATIVE NEGATIVE Final    Comment: (NOTE) SARS-CoV-2 target nucleic acids are NOT DETECTED. The SARS-CoV-2 RNA is generally detectable in upper and lower respiratory specimens during the acute phase of infection. Negative results do not preclude SARS-CoV-2 infection, do not rule out co-infections with other pathogens, and should not be used as the sole basis for treatment or other patient management decisions. Negative results must be combined with  clinical observations, patient history, and epidemiological information. The expected result is Negative. Fact Sheet for Patients: HairSlick.no Fact Sheet for Healthcare Providers: quierodirigir.com This test is not yet approved or cleared by the [place] FDA and  has been authorized for detection and/or diagnosis of SARS-CoV-2 by FDA under an Emergency  Use Authorization (EUA). This EUA will remain  in effect (meaning this test can be used) for the duration of the COVID-19 declaration under Section 56 4(b)(1) of the Act, 21 U.S.C. section 360bbb-3(b)(1), unless the authorization is terminated or revoked sooner. Performed at [facility], [address], [place], [place] [address]   Respiratory Panel by RT PCR (Flu A&B, Covid) - Nasopharyngeal Swab     Status: None   Collection Time: 03/17/20  4:55 PM   Specimen: Nasopharyngeal Swab  Result Value Ref Range Status   SARS Coronavirus 2 by RT PCR NEGATIVE NEGATIVE Final    Comment: (NOTE) SARS-CoV-2 target nucleic acids are NOT DETECTED. The SARS-CoV-2 RNA is generally detectable in upper respiratoy specimens during the acute phase of infection. The lowest concentration of SARS-CoV-2 viral copies this assay can detect is 131 copies/mL. A negative result does not preclude SARS-Cov-2 infection and should not be used as the sole basis for treatment or other patient management decisions. A negative result may occur with  improper specimen collection/handling, submission of specimen other than nasopharyngeal swab, presence of viral mutation(s) within the areas targeted by this assay, and inadequate number of viral copies (<131 copies/mL). A negative result must be combined with clinical observations, patient history, and epidemiological information. The expected result is Negative. Fact Sheet for Patients:  https://www.moore.com/ Fact Sheet for Healthcare  Providers:  https://www.young.biz/ This test is not yet ap proved or cleared by the [place] FDA and  has been authorized for detection and/or diagnosis of SARS-CoV-2 by FDA under an Emergency Use Authorization (EUA). This EUA will remain  in effect (meaning this test can be used) for the duration of the COVID-19 declaration under Section 564(b)(1) of the Act, 21 U.S.C. section 360bbb-3(b)(1), unless the authorization is terminated or revoked sooner.    Influenza A by PCR NEGATIVE NEGATIVE Final   Influenza B by PCR NEGATIVE NEGATIVE Final    Comment: (NOTE) The Xpert Xpress SARS-CoV-2/FLU/RSV assay is intended as an aid in  the diagnosis of influenza from Nasopharyngeal swab specimens and  should not be used as a sole basis for treatment. Nasal washings and  aspirates are unacceptable for Xpert Xpress SARS-CoV-2/FLU/RSV  testing. Fact Sheet for Patients: https://www.moore.com/ Fact Sheet for Healthcare Providers: https://www.young.biz/ This test is not yet approved or cleared by the [place] FDA and  has been authorized for detection and/or diagnosis of SARS-CoV-2 by  FDA under an Emergency Use Authorization (EUA). This EUA will remain  in effect (meaning this test can be used) for the duration of the  Covid-19 declaration under Section 564(b)(1) of the Act, 21  U.S.C. section 360bbb-3(b)(1), unless the authorization is  terminated or revoked. Performed at [facility], [address] [place], [place] [address]   CULTURE, BLOOD (ROUTINE X 2) w Reflex to ID Panel     Status: None (Preliminary result)   Collection Time: 03/17/20  5:35 PM   Specimen: BLOOD  Result Value Ref Range Status   Specimen Description BLOOD LAC  Final   Special Requests   Final    BOTTLES DRAWN AEROBIC AND ANAEROBIC Blood Culture adequate volume   Culture   Final    NO GROWTH 2 DAYS Performed at [facility], [address] [place], [place] [address]    Report Status PENDING  Incomplete  CULTURE, BLOOD (ROUTINE X 2) w Reflex to ID Panel     Status: None (Preliminary result)   Collection Time: 03/17/20  8:41  PM   Specimen: BLOOD  Result Value Ref Range Status   Specimen Description BLOOD R HAND  Final   Special Requests   Final    BOTTLES DRAWN AEROBIC AND ANAEROBIC Blood Culture adequate volume   Culture   Final    NO GROWTH 2 DAYS Performed at [facility], [address], [place], [place] [address]    Report Status PENDING  Incomplete     Medications:   . ARIPiprazole  20 mg Oral Daily  . atorvastatin  10 mg Oral q1800  . buPROPion  75 mg Oral Daily  . divalproex  500 mg Oral Q12H  . insulin aspart  0-20 Units Subcutaneous TID WC  . insulin aspart  6 Units Subcutaneous TID WC  . liothyronine  25 mcg Oral QAC breakfast  . metoprolol succinate  25 mg Oral Daily  . mirtazapine  15 mg Oral QHS  . rivaroxaban  20 mg Oral Q supper  . sodium chloride flush  3 mL Intravenous Q12H   Continuous Infusions: . sodium chloride 50 mL/hr at 03/18/20 1800  . sodium chloride    . ampicillin-sulbactam (UNASYN) IV Stopped (03/19/20 0441)      LOS: 2 days   Marinda Elk  [facility]  03/19/2020, 8:10 AM

## 2020-03-20 LAB — CBC
HCT: 38.3 % — ABNORMAL LOW (ref 39.0–52.0)
Hemoglobin: 12.1 g/dL — ABNORMAL LOW (ref 13.0–17.0)
MCH: 26 pg (ref 26.0–34.0)
MCHC: 31.6 g/dL (ref 30.0–36.0)
MCV: 82.4 fL (ref 80.0–100.0)
Platelets: 176 10*3/uL (ref 150–400)
RBC: 4.65 MIL/uL (ref 4.22–5.81)
RDW: 13.6 % (ref 11.5–15.5)
WBC: 6.9 10*3/uL (ref 4.0–10.5)
nRBC: 0 % (ref 0.0–0.2)

## 2020-03-20 LAB — BASIC METABOLIC PANEL
Anion gap: 7 (ref 5–15)
BUN: 23 mg/dL (ref 8–23)
CO2: 28 mmol/L (ref 22–32)
Calcium: 9 mg/dL (ref 8.9–10.3)
Chloride: 104 mmol/L (ref 98–111)
Creatinine, Ser: 1.36 mg/dL — ABNORMAL HIGH (ref 0.61–1.24)
GFR calc Af Amer: >60 mL/min (ref >60)
GFR calc non Af Amer: 54 mL/min — ABNORMAL LOW (ref >60)
Glucose, Bld: 130 mg/dL — ABNORMAL HIGH (ref 70–99)
Potassium: 4.5 mmol/L (ref 3.5–5.1)
Sodium: 139 mmol/L (ref 135–145)

## 2020-03-20 LAB — GLUCOSE, CAPILLARY
Glucose-Capillary: 127 mg/dL — ABNORMAL HIGH (ref 70–99)
Glucose-Capillary: 118 mg/dL — ABNORMAL HIGH (ref 70–99)
Glucose-Capillary: 97 mg/dL (ref 70–99)

## 2020-03-20 MED ORDER — AMOXICILLIN-POT CLAVULANATE 875-125 MG PO TABS
1.0000 | ORAL_TABLET | Freq: Two times a day (BID) | ORAL | Status: DC
  Administered 2020-03-20 – 2020-03-21 (×2): 1 via ORAL
  Filled 2020-03-20 (×2): qty 1

## 2020-03-20 MED ORDER — LISINOPRIL 20 MG PO TABS
20.0000 mg | ORAL_TABLET | Freq: Every day | ORAL | Status: DC
  Administered 2020-03-21: 20 mg via ORAL
  Filled 2020-03-20: qty 1

## 2020-03-20 MED ORDER — QUETIAPINE FUMARATE 25 MG PO TABS
25.0000 mg | ORAL_TABLET | Freq: Two times a day (BID) | ORAL | Status: DC
  Administered 2020-03-20 – 2020-03-21 (×2): 25 mg via ORAL
  Filled 2020-03-20 (×2): qty 1

## 2020-03-20 MED ORDER — QUETIAPINE FUMARATE 300 MG PO TABS
300.0000 mg | ORAL_TABLET | Freq: Every day | ORAL | Status: DC
  Administered 2020-03-20: 300 mg via ORAL
  Filled 2020-03-20 (×2): qty 1

## 2020-03-20 MED ORDER — SODIUM CHLORIDE 0.9 % IV SOLN
INTRAVENOUS | Status: DC | PRN
  Administered 2020-03-20: 250 mL via INTRAVENOUS

## 2020-03-20 NOTE — Care Management Important Message (Signed)
Important Message  Patient Details  Name: Christian [name] MRN: [id number] Date of Birth: [birth year]   Medicare Important Message Given:  Yes     [name] 03/20/2020, 2:01 PM

## 2020-03-20 NOTE — Progress Notes (Signed)
[facility] PROGRESS NOTE    Progress Note  Christian [name]  BZJ:[id number] DOB: [birth year] DOA: 03/17/2020 PCP: Donald Prose, MD     Brief Narrative:   Christian [name] is an 69 y.o. male past medical history of bipolar disorder who apparently is only teens electroconvulsive therapy, history of chronic venous stasis hypertension diabetes mellitus sleep apnea was recently seen by a psychiatrist was given his treatment of electroconvulsive therapy and postprocedural recovery room became hypoxic and to be placed on a nonrebreather.  Patient was complaining of shortness of breath on exertion and his legs were swollen he denies any chest pain diaphoresis palpitation dizziness or lightheadedness.  Chest x-ray done showed mild bilateral infiltrates Assessment/Plan:   Acute respiratory failure with hypoxia secondarily to aspiration pneumonia: 2D echo showed a preserved EF with diastolic grade 1 heart failure. Apparently on admission he was euvolemic diuretics were stopped, he was continued on metoprolol and lisinopril was DC'd. BNP was 65, will continue IV Unasyn he has remained afebrile leukocytosis is resolved.  Acute kidney injury: With a baseline creatinine of around 1, increased to 1.4 likely due to diuretics. Diuretics were stopped, he has no JVD, but does have 3+ lower extremity edema. We placed TED hose and his creatinine is today 1.3, will recheck tomorrow morning continue to hold diuretic therapy.  Keep TED hose in place the patient is auto diuresing nicely. He is -5 L his urine output over the last 24 hours was brisk. Agree with cardiology to restart his lisinopril.  Chronic diastolic heart failure: Continue to hold diuretic therapy, resume metoprolol and lisinopril pressure is improving nicely appreciate cardiology's assistance further management per cardiology.  Diabetes mellitus type 2: Oral hypoglycemic agents were held on admission, continue sliding scale  insulin.  Bipolar affective disorder: Tinea Abilify, Remeron, Depakote number papain patient has no complaints.  Hypothyroidism: Continue Synthroid.  History of bilateral PE/paroxysmal atrial fibrillation: Continue Xarelto no changes.  Sacral decubitus ulcer stage II present on admission RN Pressure Injury Documentation: Pressure Injury 08/04/18 Stage II -  Partial thickness loss of dermis presenting as a shallow open ulcer with a red, pink wound bed without slough. (Active)  08/04/18 2253  Location: Buttocks  Location Orientation: Left;Right  Staging: Stage II -  Partial thickness loss of dermis presenting as a shallow open ulcer with a red, pink wound bed without slough.  Wound Description (Comments):   Present on Admission: Yes    Estimated body mass index is 36.32 kg/m as calculated from the following:   Height as of this encounter: 6' (1.829 m).   Weight as of this encounter: 121.5 kg.   DVT prophylaxis: Xarelto Family Communication:none Status is: Inpatient  Remains inpatient appropriate because:Hemodynamically unstable   Dispo: The patient is from: Home              Anticipated d/c is to: Home              Anticipated d/c date is: 2 days              Patient currently is not medically stable to d/c. Code Status:     Code Status Orders  (From admission, onward)         Start     Ordered   03/17/20 1546  Full code  Continuous     03/17/20 1548        Code Status History    Date Active Date Inactive Code Status Order ID Comments User Context  01/30/2020 1857 02/05/2020 2106 Full Code [id number]  Audery Amel, MD Inpatient   10/31/2019 1840 11/02/2019 1855 Full Code [id number]  Melene Plan, DO ED   08/01/2018 1754 08/04/2018 1200 Full Code [id number]  Gwyneth Sprout, MD ED   06/17/2018 1700 06/23/2018 1808 Full Code [id number]  Lahoma Crocker, MD ED   03/27/2018 0325 03/31/2018 1753 Full Code [id number]  Eduard Clos, MD Inpatient   12/22/2015 1337 02/03/2016  1950 Full Code [id number]  Audery Amel, MD Inpatient   12/22/2015 0830 12/22/2015 1337 Full Code [id number]  Audery Amel, MD ED   09/05/2015 2043 09/18/2015 1817 Full Code [id number]  Clapacs, Jackquline Denmark, MD Inpatient   08/31/2015 1702 09/05/2015 2030 Full Code [id number]  Gracelyn Nurse, MD Inpatient   08/28/2015 2019 08/31/2015 1702 Full Code [id number]  Audery Amel, MD Inpatient   07/29/2015 2028 08/27/2015 1932 Full Code [id number]  Houston Siren, MD Inpatient   07/16/2015 1213 07/29/2015 2028 Full Code [id number]  Shari Prows, MD Inpatient   07/15/2015 1751 07/16/2015 1213 Full Code [id number]  Beau Fanny, MD Inpatient   07/14/2015 1913 07/15/2015 1751 Full Code [id number]  Beau Fanny, MD Inpatient   07/11/2015 1703 07/14/2015 1913 Full Code [id number]  Altamese Dilling, MD Inpatient   07/09/2015 2313 07/11/2015 1703 Full Code [id number]  Dwan Bolt, RN Inpatient   06/28/2015 2306 06/30/2015 1134 Full Code [id number]  Milta Deiters ED   06/21/2014 1533 07/16/2014 1824 Full Code [id number]  [name], NP Inpatient   06/19/2014 1454 06/21/2014 1533 Full Code [id number]  Penny Pia, MD Inpatient   06/17/2014 0545 06/19/2014 1454 Full Code [id number]  Warnell Forester, MD ED   05/25/2014 0455 06/05/2014 1827 Full Code [id number]  Kristeen Mans, NP Inpatient   05/24/2014 0408 05/25/2014 0455 Full Code [id number]  Angus Seller, PA-C ED   Advance Care Planning Activity        IV Access:    Peripheral IV   Procedures and diagnostic studies:   ECHOCARDIOGRAM COMPLETE  Result Date: 03/18/2020    ECHOCARDIOGRAM REPORT   Patient Name:   Christian [name] Date of Exam: 03/18/2020 Medical Rec #:  [id number]        Height:       72.0 in Accession #:    [id number]       Weight:       275.1 lb Date of Birth:  Apr 01, 1951        BSA:          2.440 m Patient Age:    68 years         BP:           99/66 mmHg Patient Gender: M                HR:           93 bpm. Exam Location:  [facility]  Procedure: 2D Echo, Cardiac Doppler and Color Doppler Indications:     CHF- acute diastolic 428.31  History:         Patient has prior history of Echocardiogram examinations, most                  recent 06/19/2018. Risk Factors:Diabetes, Hypertension and Sleep                  Apnea. Bipolar 1 disorder.  Sonographer:     Cristela Blue RDCS (AE) Referring Phys:  [id number] Caryn Bee PACKER  Diagnosing Phys: Adrian Blackwater MD  Sonographer Comments: Technically difficult study due to poor echo windows. IMPRESSIONS  1. Left ventricular ejection fraction, by estimation, is 50 to 55%. The left ventricle has low normal function. The left ventricle demonstrates global hypokinesis. Left ventricular diastolic parameters are consistent with Grade I diastolic dysfunction (impaired relaxation).  2. Right ventricular systolic function is mildly reduced. The right ventricular size is moderately enlarged. There is normal pulmonary artery systolic pressure.  3. Left atrial size was mild to moderately dilated.  4. Right atrial size was mild to moderately dilated.  5. The mitral valve is degenerative. Mild to moderate mitral valve regurgitation. No evidence of mitral stenosis.  6. Supraaortic stenosis. The aortic valve is abnormal. Aortic valve regurgitation is mild. Mild to moderate aortic valve stenosis.  7. The inferior vena cava is normal in size with greater than 50% respiratory variability, suggesting right atrial pressure of 3 mmHg. FINDINGS  Left Ventricle: Left ventricular ejection fraction, by estimation, is 50 to 55%. The left ventricle has low normal function. The left ventricle demonstrates global hypokinesis. The left ventricular internal cavity size was normal in size. There is no left ventricular hypertrophy. Left ventricular diastolic parameters are consistent with Grade I diastolic dysfunction (impaired relaxation). Right Ventricle: The right ventricular size is moderately enlarged. No increase in right ventricular wall  thickness. Right ventricular systolic function is mildly reduced. There is normal pulmonary artery systolic pressure. The tricuspid regurgitant velocity is 1.75 m/s, and with an assumed right atrial pressure of 10 mmHg, the estimated right ventricular systolic pressure is 22.2 mmHg. Left Atrium: Left atrial size was mild to moderately dilated. Right Atrium: Right atrial size was mild to moderately dilated. Pericardium: There is no evidence of pericardial effusion. Mitral Valve: The mitral valve is degenerative in appearance. There is severe calcification of the mitral valve leaflet(s). Moderately decreased mobility of the mitral valve leaflets. Moderate to severe mitral annular calcification. Mild to moderate mitral valve regurgitation. No evidence of mitral valve stenosis. Tricuspid Valve: The tricuspid valve is normal in structure. Tricuspid valve regurgitation is mild . No evidence of tricuspid stenosis. Aortic Valve: Supraaortic stenosis. The aortic valve is abnormal. Aortic valve regurgitation is mild. Mild to moderate aortic stenosis is present. Aortic valve mean gradient measures 2.3 mmHg. Aortic valve peak gradient measures 4.6 mmHg. Aortic valve area, by VTI measures 2.31 cm. Pulmonic Valve: The pulmonic valve was normal in structure. Pulmonic valve regurgitation is mild. No evidence of pulmonic stenosis. Aorta: The aortic root is normal in size and structure. Venous: The inferior vena cava is normal in size with greater than 50% respiratory variability, suggesting right atrial pressure of 3 mmHg. IAS/Shunts: No atrial level shunt detected by color flow Doppler.  LEFT VENTRICLE PLAX 2D LVIDd:         4.88 cm  Diastology LVIDs:         3.56 cm  LV e' lateral:   14.00 cm/s LV PW:         1.24 cm  LV E/e' lateral: 4.8 LV IVS:        1.60 cm  LV e' medial:    5.66 cm/s LVOT diam:     2.00 cm  LV E/e' medial:  11.8 LV SV:         35 LV SV Index:   15 LVOT Area:     3.14 cm  RIGHT VENTRICLE RV Basal diam:  3.83  cm RV S prime:     15.90 cm/s  TAPSE (M-mode): 2.5 cm LEFT ATRIUM           Index       RIGHT ATRIUM           Index LA diam:      4.20 cm 1.72 cm/m  RA Area:     14.40 cm LA Vol (A2C): 29.2 ml 11.97 ml/m RA Volume:   30.50 ml  12.50 ml/m LA Vol (A4C): 42.6 ml 17.46 ml/m  AORTIC VALVE AV Area (Vmax):    1.61 cm AV Area (Vmean):   1.48 cm AV Area (VTI):     2.31 cm AV Vmax:           107.00 cm/s AV Vmean:          69.133 cm/s AV VTI:            0.154 m AV Peak Grad:      4.6 mmHg AV Mean Grad:      2.3 mmHg LVOT Vmax:         54.70 cm/s LVOT Vmean:        32.600 cm/s LVOT VTI:          0.113 m LVOT/AV VTI ratio: 0.73  AORTA Ao Root diam: 3.10 cm MITRAL VALVE               TRICUSPID VALVE MV Area (PHT): 3.06 cm    TR Peak grad:   12.2 mmHg MV Decel Time: 248 msec    TR Vmax:        175.00 cm/s MV E velocity: 66.90 cm/s MV A velocity: 95.60 cm/s  SHUNTS MV E/A ratio:  0.70        Systemic VTI:  0.11 m                            Systemic Diam: 2.00 cm Adrian Blackwater MD Electronically signed by Adrian Blackwater MD Signature Date/Time: 03/18/2020/4:06:35 PM    Final      Medical Consultants:    None.  Anti-Infectives:   none  Subjective:    Zeki L Baumgart no complaints this morning no further cough.  Objective:    Vitals:   03/19/20 1725 03/19/20 1915 03/20/20 0523 03/20/20 0735  BP: (!) 149/89 130/89 (!) 157/85 (!) 146/86  Pulse: 84 80 76 84  Resp: 18 (!) Temp: 98.6 F (37 C) 98.8 F (37.1 C) 98.1 F (36.7 C) 98.2 F (36.8 C)  TempSrc: Oral Oral Oral Oral  SpO2: 91% 96% 96% 93%  Weight:   121.5 kg   Height:       SpO2: 93 % O2 Flow Rate (L/min): 1 L/min FiO2 (%): 55 %   Intake/Output Summary (Last 24 hours) at 03/20/2020 1020 Last data filed at 03/20/2020 0634 Gross per 24 hour  Intake 1149 ml  Output 5650 ml  Net -4501 ml   Filed Weights   03/18/20 0418 03/19/20 0359 03/20/20 0523  Weight: 124.8 kg 124.4 kg 121.5 kg    Exam: General exam: In no acute  distress. Respiratory system: Good air movement and clear to auscultation. Cardiovascular system: S1 & S2 heard, RRR. No JVD. Gastrointestinal system: Abdomen is nondistended, soft and nontender.  Central nervous system: Alert and oriented. No focal neurological deficits. Extremities: Trace edema Skin: No rashes, lesions or ulcers   Data Reviewed:    Labs: Basic Metabolic Panel: Recent Labs  Lab 03/17/20 1329 03/17/20 1329 03/18/20  7741 03/18/20 0455 03/19/20 0616 03/20/20 0611  NA 133*  --  134*  --  135 139  K 4.9   < > 4.3   < > 4.5 4.5  CL 97*  --  98  --  100 104  CO2 22  --  26  --  28 28  GLUCOSE 264*  --  143*  --  160* 130*  BUN 20  --  27*  --  32* 23  CREATININE 1.49*  --  1.94*  --  1.78* 1.36*  CALCIUM 8.8*  --  8.3*  --  8.3* 9.0   < > = values in this interval not displayed.   GFR Estimated Creatinine Clearance: 71.9 mL/min (A) (by C-G formula based on SCr of 1.36 mg/dL (H)). Liver Function Tests: No results for input(s): AST, ALT, ALKPHOS, BILITOT, PROT, ALBUMIN in the last 168 hours. No results for input(s): LIPASE, AMYLASE in the last 168 hours. No results for input(s): AMMONIA in the last 168 hours. Coagulation profile No results for input(s): INR, PROTIME in the last 168 hours. COVID-19 Labs  No results for input(s): DDIMER, FERRITIN, LDH, CRP in the last 72 hours.  Lab Results  Component Value Date   SARSCOV2NAA NEGATIVE 03/17/2020   SARSCOV2NAA NEGATIVE 03/14/2020   SARSCOV2NAA NEGATIVE 02/29/2020   SARSCOV2NAA NEGATIVE 02/15/2020    CBC: Recent Labs  Lab 03/17/20 1329 03/20/20 0611  WBC 7.0 6.9  HGB 14.5 12.1*  HCT 48.0 38.3*  MCV 84.2 82.4  PLT 167 176   Cardiac Enzymes: No results for input(s): CKTOTAL, CKMB, CKMBINDEX, TROPONINI in the last 168 hours. BNP (last 3 results) No results for input(s): PROBNP in the last 8760 hours. CBG: Recent Labs  Lab 03/19/20 0725 03/19/20 1249 03/19/20 1644 03/19/20 2119 03/20/20 0735   GLUCAP 112* 142* 127* 118* 97   D-Dimer: No results for input(s): DDIMER in the last 72 hours. Hgb A1c: No results for input(s): HGBA1C in the last 72 hours. Lipid Profile: No results for input(s): CHOL, HDL, LDLCALC, TRIG, CHOLHDL, LDLDIRECT in the last 72 hours. Thyroid function studies: No results for input(s): TSH, T4TOTAL, T3FREE, THYROIDAB in the last 72 hours.  Invalid input(s): FREET3 Anemia work up: No results for input(s): VITAMINB12, FOLATE, FERRITIN, TIBC, IRON, RETICCTPCT in the last 72 hours. Sepsis Labs: Recent Labs  Lab 03/17/20 1329 03/20/20 0611  WBC 7.0 6.9   Microbiology Recent Results (from the past 240 hour(s))  SARS CORONAVIRUS 2 (TAT 6-24 HRS) Nasopharyngeal Nasopharyngeal Swab     Status: None   Collection Time: 03/14/20 12:29 PM   Specimen: Nasopharyngeal Swab  Result Value Ref Range Status   SARS Coronavirus 2 NEGATIVE NEGATIVE Final    Comment: (NOTE) SARS-CoV-2 target nucleic acids are NOT DETECTED. The SARS-CoV-2 RNA is generally detectable in upper and lower respiratory specimens during the acute phase of infection. Negative results do not preclude SARS-CoV-2 infection, do not rule out co-infections with other pathogens, and should not be used as the sole basis for treatment or other patient management decisions. Negative results must be combined with clinical observations, patient history, and epidemiological information. The expected result is Negative. Fact Sheet for Patients: HairSlick.no Fact Sheet for Healthcare Providers: quierodirigir.com This test is not yet approved or cleared by the [place] FDA and  has been authorized for detection and/or diagnosis of SARS-CoV-2 by FDA under an Emergency Use Authorization (EUA). This EUA will remain  in effect (meaning this test can be used) for the duration  of the COVID-19 declaration under Section 56 4(b)(1) of the Act, 21  U.S.C. section 360bbb-3(b)(1), unless the authorization is terminated or revoked sooner. Performed at [facility], [address] [place], [place] [address]   Respiratory Panel by RT PCR (Flu A&B, Covid) - Nasopharyngeal Swab     Status: None   Collection Time: 03/17/20  4:55 PM   Specimen: Nasopharyngeal Swab  Result Value Ref Range Status   SARS Coronavirus 2 by RT PCR NEGATIVE NEGATIVE Final    Comment: (NOTE) SARS-CoV-2 target nucleic acids are NOT DETECTED. The SARS-CoV-2 RNA is generally detectable in upper respiratoy specimens during the acute phase of infection. The lowest concentration of SARS-CoV-2 viral copies this assay can detect is 131 copies/mL. A negative result does not preclude SARS-Cov-2 infection and should not be used as the sole basis for treatment or other patient management decisions. A negative result may occur with  improper specimen collection/handling, submission of specimen other than nasopharyngeal swab, presence of viral mutation(s) within the areas targeted by this assay, and inadequate number of viral copies (<131 copies/mL). A negative result must be combined with clinical observations, patient history, and epidemiological information. The expected result is Negative. Fact Sheet for Patients:  https://www.moore.com/ Fact Sheet for Healthcare Providers:  https://www.young.biz/ This test is not yet ap proved or cleared by the [place] FDA and  has been authorized for detection and/or diagnosis of SARS-CoV-2 by FDA under an Emergency Use Authorization (EUA). This EUA will remain  in effect (meaning this test can be used) for the duration of the COVID-19 declaration under Section 564(b)(1) of the Act, 21 U.S.C. section 360bbb-3(b)(1), unless the authorization is terminated or revoked sooner.    Influenza A by PCR NEGATIVE NEGATIVE Final   Influenza B by PCR NEGATIVE NEGATIVE Final    Comment:  (NOTE) The Xpert Xpress SARS-CoV-2/FLU/RSV assay is intended as an aid in  the diagnosis of influenza from Nasopharyngeal swab specimens and  should not be used as a sole basis for treatment. Nasal washings and  aspirates are unacceptable for Xpert Xpress SARS-CoV-2/FLU/RSV  testing. Fact Sheet for Patients: https://www.moore.com/ Fact Sheet for Healthcare Providers: https://www.young.biz/ This test is not yet approved or cleared by the [place] FDA and  has been authorized for detection and/or diagnosis of SARS-CoV-2 by  FDA under an Emergency Use Authorization (EUA). This EUA will remain  in effect (meaning this test can be used) for the duration of the  Covid-19 declaration under Section 564(b)(1) of the Act, 21  U.S.C. section 360bbb-3(b)(1), unless the authorization is  terminated or revoked. Performed at [facility], [address] [place], [place] [address]   CULTURE, BLOOD (ROUTINE X 2) w Reflex to ID Panel     Status: None (Preliminary result)   Collection Time: 03/17/20  5:35 PM   Specimen: BLOOD  Result Value Ref Range Status   Specimen Description BLOOD LAC  Final   Special Requests   Final    BOTTLES DRAWN AEROBIC AND ANAEROBIC Blood Culture adequate volume   Culture   Final    NO GROWTH 3 DAYS Performed at [facility], [address] [place], [place] [address]    Report Status PENDING  Incomplete  CULTURE, BLOOD (ROUTINE X 2) w Reflex to ID Panel     Status: None (Preliminary result)   Collection Time: 03/17/20  8:41 PM   Specimen: BLOOD  Result Value Ref Range Status   Specimen Description BLOOD R HAND  Final   Special Requests   Final    BOTTLES DRAWN AEROBIC AND ANAEROBIC Blood Culture adequate volume   Culture   Final    NO GROWTH 3 DAYS Performed at [facility], [address] [place], [place] [address]    Report Status PENDING  Incomplete     Medications:   .  ARIPiprazole  20 mg Oral Daily  . atorvastatin  10 mg Oral q1800  . buPROPion  75 mg Oral Daily  . divalproex  500 mg Oral Q12H  . insulin aspart  0-20 Units Subcutaneous TID WC  . insulin aspart  6 Units Subcutaneous TID WC  . liothyronine  25 mcg Oral QAC breakfast  . lisinopril  10 mg Oral Daily  . metoprolol succinate  25 mg Oral Daily  . mirtazapine  15 mg Oral QHS  . rivaroxaban  20 mg Oral Q supper   Continuous Infusions: . sodium chloride 250 mL (03/20/20 1004)  . ampicillin-sulbactam (UNASYN) IV 3 g (03/20/20 1005)      LOS: 3 days   Marinda Elk  [facility]  03/20/2020, 10:20 AM

## 2020-03-20 NOTE — Progress Notes (Signed)
SUBJECTIVE: Patient denies shortness of breath or chest pain.  Patient has been weaned down to room air   Vitals:   03/19/20 1725 03/19/20 1915 03/20/20 0523 03/20/20 0735  BP: (!) 149/89 130/89 (!) 157/85 (!) 146/86  Pulse: 84 80 76 84  Resp: 18 (!) 21 20 18   Temp: 98.6 F (37 C) 98.8 F (37.1 C) 98.1 F (36.7 C) 98.2 F (36.8 C)  TempSrc: Oral Oral Oral Oral  SpO2: 91% 96% 96% 93%  Weight:   121.5 kg   Height:        Intake/Output Summary (Last 24 hours) at 03/20/2020 1054 Last data filed at 03/20/2020 03/22/2020 Gross per 24 hour  Intake 1149 ml  Output 5650 ml  Net -4501 ml    LABS: Basic Metabolic Panel: Recent Labs    03/19/20 0616 03/20/20 0611  NA 135 139  K 4.5 4.5  CL 100 104  CO2 28 28  GLUCOSE 160* 130*  BUN 32* 23  CREATININE 1.78* 1.36*  CALCIUM 8.3* 9.0   Liver Function Tests: No results for input(s): AST, ALT, ALKPHOS, BILITOT, PROT, ALBUMIN in the last 72 hours. No results for input(s): LIPASE, AMYLASE in the last 72 hours. CBC: Recent Labs    03/17/20 1329 03/20/20 0611  WBC 7.0 6.9  HGB 14.5 12.1*  HCT 48.0 38.3*  MCV 84.2 82.4  PLT 167 176   Cardiac Enzymes: No results for input(s): CKTOTAL, CKMB, CKMBINDEX, TROPONINI in the last 72 hours. BNP: Invalid input(s): POCBNP D-Dimer: No results for input(s): DDIMER in the last 72 hours. Hemoglobin A1C: No results for input(s): HGBA1C in the last 72 hours. Fasting Lipid Panel: No results for input(s): CHOL, HDL, LDLCALC, TRIG, CHOLHDL, LDLDIRECT in the last 72 hours. Thyroid Function Tests: No results for input(s): TSH, T4TOTAL, T3FREE, THYROIDAB in the last 72 hours.  Invalid input(s): FREET3 Anemia Panel: No results for input(s): VITAMINB12, FOLATE, FERRITIN, TIBC, IRON, RETICCTPCT in the last 72 hours.   PHYSICAL EXAM General: Well developed, well nourished, in no acute distress HEENT:  Normocephalic and atramatic Neck:  No JVD.  Lungs: Clear bilaterally to auscultation and  percussion. Heart: HRRR . Normal S1 and S2 without gallops or murmurs.  Abdomen: Bowel sounds are positive, abdomen soft and non-tender  Msk:  Back normal, normal gait. Normal strength and tone for age. Extremities: No clubbing, cyanosis or edema.   Neuro: Alert and oriented X 3. Psych:  Good affect, responds appropriately  TELEMETRY: ST/ 105/bpm  ASSESSMENT AND PLAN: Patient admitted to the hospital with acute on chronic HFpEF with respiratory distress vs PNA after electroconvulsive therapy. Respiratory status has improved as patient is only on 1L [place].  Echocardiogram shows EF 50 to 55%, left ventricular global hypokinesis with grade 1 diastolic dysfunction, right ventricle moderately enlarged, moderate MR/AS.  Blood pressure mildly elevated this morning.  Patient's current AKI continues to improve with a creatinine of 1.36 this morning.  Will increase the patient's lisinopril dose to 20 mg daily and continue metoprolol succinate 25 mg daily.  If patient's current tachycardia does not improve would recommend increasing metoprolol dose to 50 mg daily.  With the patient's continuing improvement of kidney function will most likely start on spironolactone as an outpatient to optimize the patient's HFpEF. Please continue his home dose of Xarelto. We will continue to monitor and assist in optimizing patient's current HFpEF.  From a cardiac point of view the patient is stable and ready for discharge.  Please ensure that the patient  has a follow-up appointment with [place] on Tuesday, April 27.  Active Problems:   Hypertension   OSA on CPAP   Bipolar affective disorder, depressed, mild (HCC)   Bilateral pulmonary embolism (HCC)   Acute respiratory failure with hypoxia (HCC)   Paroxysmal atrial fibrillation (HCC)   Chronic diastolic CHF (congestive heart failure) ([place])   Sacral decubitus ulcer, stage II ([place])    Christian Sill, NP-C 03/20/2020 10:54 AM

## 2020-03-21 DIAGNOSIS — J690 Pneumonitis due to inhalation of food and vomit: Principal | ICD-10-CM

## 2020-03-21 LAB — GLUCOSE, CAPILLARY
Glucose-Capillary: 230 mg/dL — ABNORMAL HIGH (ref 70–99)
Glucose-Capillary: 155 mg/dL — ABNORMAL HIGH (ref 70–99)
Glucose-Capillary: 150 mg/dL — ABNORMAL HIGH (ref 70–99)
Glucose-Capillary: 126 mg/dL — ABNORMAL HIGH (ref 70–99)

## 2020-03-21 LAB — BASIC METABOLIC PANEL
Anion gap: 10 (ref 5–15)
BUN: 22 mg/dL (ref 8–23)
CO2: 25 mmol/L (ref 22–32)
Calcium: 8.5 mg/dL — ABNORMAL LOW (ref 8.9–10.3)
Chloride: 101 mmol/L (ref 98–111)
Creatinine, Ser: 1.47 mg/dL — ABNORMAL HIGH (ref 0.61–1.24)
GFR calc Af Amer: 57 mL/min — ABNORMAL LOW (ref >60)
GFR calc non Af Amer: 49 mL/min — ABNORMAL LOW (ref >60)
Glucose, Bld: 143 mg/dL — ABNORMAL HIGH (ref 70–99)
Potassium: 4.6 mmol/L (ref 3.5–5.1)
Sodium: 136 mmol/L (ref 135–145)

## 2020-03-21 MED ORDER — METOPROLOL SUCCINATE ER 50 MG PO TB24: 50.0000 mg | ORAL_TABLET | Freq: Every day | ORAL | 3 refills | Status: DC

## 2020-03-21 MED ORDER — AMOXICILLIN-POT CLAVULANATE 875-125 MG PO TABS: 1.0000 | ORAL_TABLET | Freq: Two times a day (BID) | ORAL | 0 refills | Status: DC

## 2020-03-21 MED ORDER — METOPROLOL SUCCINATE ER 50 MG PO TB24
50.0000 mg | ORAL_TABLET | Freq: Every day | ORAL | Status: DC
  Administered 2020-03-21: 50 mg via ORAL
  Filled 2020-03-21: qty 1

## 2020-03-21 NOTE — TOC Initial Note (Signed)
Transition of Care [facility]) - Initial/Assessment Note    Patient Details  Name: Christian [name] MRN: [id number] Date of Birth: 26-Mar-1951  Transition of [facility]) CM/SW Contact:    Maree Krabbe, LCSW Phone Number: 03/21/2020, 9:46 AM  Clinical Narrative:     Pt alert and oriented. Pt will d/c home today. Pt is agreeable to [facility]. Pt did not have a preference. [facility] will accept pt. Pt requesting a walker. MD notified for order.              Expected Discharge Plan: Home w Home Health Services Barriers to Discharge: No Barriers Identified   Patient Goals and CMS Choice Patient states their goals for this hospitalization and ongoing recovery are:: to get stronger   Choice offered to / list presented to : Patient  Expected Discharge Plan and Services Expected Discharge Plan: Home w Home Health Services In-house Referral: Clinical Social Work   Post Acute Care Choice: Home Health Living arrangements for the past 2 months: Single Family Home Expected Discharge Date: 03/21/20               DME Arranged: Dan Humphreys DME Agency: [employer] Date DME Agency Contacted: 03/21/20 Time DME Agency Contacted: [phone] Representative spoke with at DME Agency: brad HH Arranged: PT, OT HH Agency: [facility] (formerly [facility]) Date HH Agency Contacted: 03/21/20 Time HH Agency Contacted: 0945 Representative spoke with at [facility] Agency: Rosey Bath  Prior Living Arrangements/Services Living arrangements for the past 2 months: Single Family Home Lives with:: Self Patient language and need for interpreter reviewed:: Yes Do you feel safe going back to the place where you live?: Yes      Need for Family Participation in Patient Care: Yes (Comment) Care giver support system in place?: Yes (comment)   Criminal Activity/Legal Involvement Pertinent to Current Situation/Hospitalization: No - Comment as needed  Activities of Daily Living Home Assistive Devices/Equipment: Walker (specify type)(4  wheel walker) ADL Screening (condition at time of admission) Patient's cognitive ability adequate to safely complete daily activities?: Yes Is the patient deaf or have difficulty hearing?: No Does the patient have difficulty seeing, even when wearing glasses/contacts?: No Does the patient have difficulty concentrating, remembering, or making decisions?: No Patient able to express need for assistance with ADLs?: Yes Does the patient have difficulty dressing or bathing?: Yes Independently performs ADLs?: Yes (appropriate for developmental age) Communication: Independent Dressing (OT): (has trouble putting shirts on because of tremors) Grooming: Independent Feeding: Independent Bathing: Independent Toileting: Independent In/Out Bed: Independent Walks in Home: Independent with device (comment)(4 wheel walker with seat) Does the patient have difficulty walking or climbing stairs?: Yes Weakness of Legs: Both Weakness of Arms/Hands: None  Permission Sought/Granted Permission sought to share information with : Family Supports    Share Information with NAME: Designer, multimedia granted to share info w AGENCY: [facility]  Permission granted to share info w Relationship: Legal Guardian     Emotional Assessment Appearance:: Appears stated age Attitude/Demeanor/Rapport: Engaged Affect (typically observed): Accepting, Appropriate Orientation: : Oriented to Self, Oriented to Place, Oriented to  Time, Oriented to Situation Alcohol / Substance Use: Not Applicable Psych Involvement: No (comment)  Admission diagnosis:  Acute on chronic diastolic CHF (congestive heart failure) (HCC) [I50.33] Patient Active Problem List   Diagnosis Date Noted  . Aspiration pneumonia of both lower lobes due to gastric secretions (HCC) 03/21/2020  . Sacral decubitus ulcer, stage II (HCC) 03/19/2020  . Chronic diastolic CHF (congestive heart failure) (HCC) 03/17/2020  .  Encephalopathy   . Paroxysmal atrial  fibrillation ([place]) 06/18/2018  . History of pulmonary embolism 06/17/2018  . Generalized weakness 03/27/2018  . Diabetes mellitus type 2 in nonobese ([place]) 03/27/2018  . Pulmonary embolism ([place]) 03/27/2018  . Fall   . Laceration of eyebrow   . Bipolar I disorder, most recent episode depressed, severe w psychosis ([place]) 12/22/2015  . Bipolar affective disorder, depressed, severe, with psychotic behavior ([place])   . Bipolar I disorder, current or most recent episode depressed, with psychotic features ([place])   . Bipolar I disorder, most recent episode depressed ([place])   . Severe bipolar I disorder with depression ([place])   . Bipolar I disorder, most recent episode depressed, severe with psychotic features ([place])   . Overdose 09/11/2015  . Acute respiratory failure with hypoxia ([place]) 09/05/2015  . Elevated troponin 09/05/2015  . Somnolence 09/05/2015  . Bipolar depression ([place]) 09/05/2015  . Acute bilateral deep vein thrombosis (DVT) of femoral veins ([place]) 09/05/2015  . Bilateral pulmonary embolism ([place]) 09/02/2015  . Labile hypertension   . Pulmonary embolism with acute cor pulmonale ([place])   . Dyspnea   . Orthostatic hypotension   . Bipolar disorder, in partial remission, most recent episode depressed ([place])   . Syncope, near 08/31/2015  . Affective psychosis, bipolar ([place])   . Bipolar affective disorder, depressed, mild ([place]) 08/28/2015  . Pressure ulcer 07/30/2015  . Protein-calorie malnutrition, severe ([place]) 07/30/2015  . Dehydration 07/29/2015  . Diabetes ([place]) 07/09/2015  . UTI (urinary tract infection) 06/19/2014  . Positive blood culture 06/19/2014  . Type 2 diabetes mellitus with complication, without long-term current use of insulin ([place]) 06/02/2014  . Other and unspecified hyperlipidemia 06/02/2014  . OSA on CPAP 06/02/2014  . Bilateral lower extremity edema: chronic with venous stasis changes 06/02/2014  . Gout 06/02/2014  . Hypertension    PCP:  Donald Prose, MD Pharmacy:    [facility], [address] [address] Phone: [phone] Fax: [phone]     Social Determinants of Health (SDOH) Interventions    Readmission Risk Interventions No flowsheet data found.

## 2020-03-21 NOTE — Plan of Care (Signed)
  Problem: Education: Goal: Knowledge of General Education information will improve Description: Including pain rating scale, medication(s)/side effects and non-pharmacologic comfort measures Outcome: Adequate for Discharge   Problem: Health Behavior/Discharge Planning: Goal: Ability to manage health-related needs will improve Outcome: Adequate for Discharge   Problem: Clinical Measurements: Goal: Ability to maintain clinical measurements within normal limits will improve Outcome: Adequate for Discharge Goal: Diagnostic test results will improve Outcome: Adequate for Discharge Goal: Respiratory complications will improve Outcome: Adequate for Discharge Goal: Cardiovascular complication will be avoided Outcome: Adequate for Discharge   Problem: Activity: Goal: Risk for activity intolerance will decrease Outcome: Adequate for Discharge   Problem: Nutrition: Goal: Adequate nutrition will be maintained Outcome: Adequate for Discharge   Problem: Coping: Goal: Level of anxiety will decrease Outcome: Adequate for Discharge   Problem: Elimination: Goal: Will not experience complications related to bowel motility Outcome: Adequate for Discharge Goal: Will not experience complications related to urinary retention Outcome: Adequate for Discharge   Problem: Pain Managment: Goal: General experience of comfort will improve Outcome: Adequate for Discharge   Problem: Safety: Goal: Ability to remain free from injury will improve Outcome: Adequate for Discharge   Problem: Skin Integrity: Goal: Risk for impaired skin integrity will decrease Outcome: Adequate for Discharge   Problem: Education: Goal: Ability to demonstrate management of disease process will improve Outcome: Adequate for Discharge Goal: Ability to verbalize understanding of medication therapies will improve Outcome: Adequate for Discharge Goal: Individualized Educational Video(s) Outcome: Adequate for Discharge    Problem: Activity: Goal: Capacity to carry out activities will improve Outcome: Adequate for Discharge   Problem: Cardiac: Goal: Ability to achieve and maintain adequate cardiopulmonary perfusion will improve Outcome: Adequate for Discharge   

## 2020-03-21 NOTE — Discharge Summary (Addendum)
Physician Discharge Summary  Christian [name] YEB:[id number] DOB: 1951/02/19 DOA: 03/17/2020  PCP: [name], MD  Admit date: 03/17/2020 Discharge date: 03/21/2020  Admitted From: Home Disposition:  Home  Recommendations for Outpatient Follow-up:  1. Follow up with PCP in 1-2 weeks 2. Please obtain BMP/CBC in one week   Home Health:No Equipment/Devices:None  Discharge Condition:Stable CODE STATUS:Full Diet recommendation: Heart Healthy  Brief/Interim Summary: 69 y.o. male past medical history of bipolar disorder who apparently is only teens electroconvulsive therapy, history of chronic venous stasis hypertension diabetes mellitus sleep apnea was recently seen by a psychiatrist was given his treatment of electroconvulsive therapy and postprocedural recovery room became hypoxic and to be placed on a nonrebreather.  Patient was complaining of shortness of breath on exertion and his legs were swollen he denies any chest pain diaphoresis palpitation dizziness or lightheadedness.  Chest x-ray done showed mild bilateral infiltrates  Discharge Diagnoses:  Active Problems:   Hypertension   OSA on CPAP   Bipolar affective disorder, depressed, mild (HCC)   Bilateral pulmonary embolism (HCC)   Acute respiratory failure with hypoxia (HCC)   Paroxysmal atrial fibrillation (HCC)   Chronic diastolic CHF (congestive heart failure) (HCC)   Sacral decubitus ulcer, stage II (HCC)   Aspiration pneumonia of both lower lobes due to gastric secretions (HCC) Acute respiratory failure with hypoxia secondary to aspiration pneumonia: 2D echo showed preserved EF with grade 1 diastolic heart failure, he appears euvolemic on physical exam. Chest x-ray showed infiltrate and a mild leukocytosis BNP was 65 he was started on IV Unasyn his leukocytosis resolved he completed a 7-day course of antibiotics.  Acute kidney injury: With a baseline creatinine of around 1 on admission 1.4 he received IV Lasix and his  creatinine trended up. TED hose were placed his creatinine improved to baseline. And he auto diuresed over 5 L in a 24-hour period.  Chronic diastolic heart failure: Diuretics were held on admission his metoprolol was resumed once his creatinine improved his lisinopril and Lasix were restarted and will follow up with cardiology as an outpatient.  Diabetes mellitus type 2: Monitor his medication.  Bipolar disorder: Continue Abilify, Remeron, Depakote as no changes were made to his medication.  Hypothyroidism: Continue Synthroid.  History of bilateral PE/paroxysmal atrial fibrillation: Continue Xarelto and he is rate control.  Sacral decubitus ulcer stage II present on admission:    Discharge Instructions  Discharge Instructions    Diet - low sodium heart healthy   Complete by: As directed    Increase activity slowly   Complete by: As directed      Allergies as of 03/21/2020      Reactions   Zyprexa [olanzapine] Other (See Comments)   Allergy noted by ACT Team. No reaction specified (??)      Medication List    TAKE these medications   Acetaminophen Extra Strength 500 MG tablet Generic drug: acetaminophen Take 1,000 mg by mouth 2 (two) times daily.   amLODipine 10 MG tablet Commonly known as: NORVASC Take 1 tablet (10 mg total) by mouth daily.   amoxicillin-clavulanate 875-125 MG tablet Commonly known as: AUGMENTIN Take 1 tablet by mouth every 12 (twelve) hours.   ARIPiprazole 20 MG tablet Commonly known as: ABILIFY Take 1 tablet (20 mg total) by mouth daily.   atorvastatin 10 MG tablet Commonly known as: LIPITOR Take 1 tablet (10 mg total) by mouth daily at 6 PM.   buPROPion 75 MG tablet Commonly known as: WELLBUTRIN Take 1 tablet (75  mg total) by mouth daily.   divalproex 500 MG DR tablet Commonly known as: DEPAKOTE Take 1 tablet (500 mg total) by mouth every 12 (twelve) hours.   furosemide 20 MG tablet Commonly known as: LASIX Take 1 tablet (20  mg total) by mouth daily.   glimepiride 2 MG tablet Commonly known as: AMARYL Take 2 mg by mouth daily with breakfast.   liothyronine 25 MCG tablet Commonly known as: CYTOMEL Take 25 mcg by mouth daily before breakfast.   lisinopril 40 MG tablet Commonly known as: ZESTRIL Take 1 tablet (40 mg total) by mouth daily.   metoprolol succinate 25 MG 24 hr tablet Commonly known as: TOPROL-XL Take 25 mg by mouth daily.   mirtazapine 15 MG disintegrating tablet Commonly known as: REMERON SOL-TAB Take 1 tablet (15 mg total) by mouth at bedtime.   QUEtiapine 25 MG tablet Commonly known as: SEROQUEL Take 25 mg by mouth 2 (two) times daily.   QUEtiapine 300 MG tablet Commonly known as: SEROQUEL Take 300 mg by mouth at bedtime.   rivaroxaban 20 MG Tabs tablet Commonly known as: XARELTO Take 20 mg by mouth daily with supper.   sitaGLIPtin 25 MG tablet Commonly known as: JANUVIA Take 25 mg by mouth daily.      Follow-up Information    [facility] Follow up on 04/01/2020.   Specialty: Cardiology Why: at 12:30pm. Enter through the [facility] entrance Contact information: [place] [address] [place] [phone]       Dionisio David, MD. Daphane Shepherd on 03/25/2020.   Specialty: Cardiology Why: [id number] Contact information: [place] [address] [phone]          Allergies  Allergen Reactions  . Zyprexa [Olanzapine] Other (See Comments)    Allergy noted by ACT Team. No reaction specified (??)    Consultations:  CArdiology   Procedures/Studies: CT ANGIO CHEST PE W OR WO CONTRAST  Result Date: 03/17/2020 CLINICAL DATA:  Shortness of breath and leg swelling after electroconvulsive therapy. EXAM: CT ANGIOGRAPHY CHEST WITH CONTRAST TECHNIQUE: Multidetector CT imaging of the chest was performed using the standard protocol during bolus administration of intravenous contrast.  Multiplanar CT image reconstructions and MIPs were obtained to evaluate the vascular anatomy. CONTRAST:  19mL OMNIPAQUE IOHEXOL 350 MG/ML SOLN COMPARISON:  Chest x-ray from same day. CT chest dated August 29, 2018. CTA chest dated September 02, 2015. FINDINGS: Cardiovascular: Satisfactory opacification of the pulmonary arteries to the segmental level. No evidence of pulmonary embolism. Normal heart size. No pericardial effusion. No thoracic aortic aneurysm or dissection. Mild aortic arch and severe three-vessel coronary artery atherosclerotic calcification. Mediastinum/Nodes: No enlarged mediastinal, hilar, or axillary lymph nodes. Mildly patulous esophagus. Thyroid gland and trachea demonstrate no significant findings. Lungs/Pleura: Patchy dependent consolidation and ground-glass densities in the right greater than left upper lobes, right middle lobe, and both lower lobes. No pleural effusion or pneumothorax. Upper Abdomen: No acute abnormality. Musculoskeletal: No chest wall abnormality. No acute or significant osseous findings. Review of the MIP images confirms the above findings. IMPRESSION: 1. No evidence of pulmonary embolism. 2. Extensive multifocal aspiration pneumonia. 3. Aortic Atherosclerosis (ICD10-I70.0). Electronically Signed   By: Titus Dubin M.D.   On: 03/17/2020 20:33   US Venous Img Lower Bilateral (DVT)  Result Date: 03/18/2020 CLINICAL DATA:  69 year old male with pitting edema EXAM: BILATERAL LOWER EXTREMITY VENOUS DOPPLER ULTRASOUND TECHNIQUE: Gray-scale sonography with graded compression, as well as color Doppler  and duplex ultrasound were performed to evaluate the lower extremity deep venous systems from the level of the common femoral vein and including the common femoral, femoral, profunda femoral, popliteal and calf veins including the posterior tibial, peroneal and gastrocnemius veins when visible. The superficial great saphenous vein was also interrogated. Spectral Doppler was  utilized to evaluate flow at rest and with distal augmentation maneuvers in the common femoral, femoral and popliteal veins. COMPARISON:  None. FINDINGS: RIGHT LOWER EXTREMITY Common Femoral Vein: No evidence of thrombus. Normal compressibility, respiratory phasicity and response to augmentation. Saphenofemoral Junction: No evidence of thrombus. Normal compressibility and flow on color Doppler imaging. Profunda Femoral Vein: No evidence of thrombus. Normal compressibility and flow on color Doppler imaging. Femoral Vein: No evidence of thrombus. Normal compressibility, respiratory phasicity and response to augmentation. Popliteal Vein: No evidence of thrombus. Normal compressibility, respiratory phasicity and response to augmentation. Calf Veins: No thrombus of the posterior tibial vein. Peroneal vein not visualized. Superficial Great Saphenous Vein: No evidence of thrombus. Normal compressibility and flow on color Doppler imaging. Other Findings:  Mild edema LEFT LOWER EXTREMITY Common Femoral Vein: No evidence of thrombus. Normal compressibility, respiratory phasicity and response to augmentation. Saphenofemoral Junction: No evidence of thrombus. Normal compressibility and flow on color Doppler imaging. Profunda Femoral Vein: No evidence of thrombus. Normal compressibility and flow on color Doppler imaging. Femoral Vein: No evidence of thrombus. Normal compressibility, respiratory phasicity and response to augmentation. Popliteal Vein: No evidence of thrombus. Normal compressibility, respiratory phasicity and response to augmentation. Calf Veins: No thrombus of the posterior tibial vein. Peroneal vein not visualized. Superficial Great Saphenous Vein: No evidence of thrombus. Normal compressibility and flow on color Doppler imaging. Other Findings:  Mild edema IMPRESSION: Sonographic survey of the bilateral lower extremity negative for DVT. Electronically Signed   By: Gilmer Mor D.O.   On: 03/18/2020 08:49   DG  Chest Port 1 View  Result Date: 03/17/2020 CLINICAL DATA:  Hypoxia. EXAM: PORTABLE CHEST 1 VIEW COMPARISON:  December 26, 2019. FINDINGS: The heart size and mediastinal contours are within normal limits. No pneumothorax or pleural effusion is noted. Stable mild bilateral hazy densities are noted in both upper lobes which may represent scarring from prior inflammation, although superimposed acute inflammation cannot be excluded. The visualized skeletal structures are unremarkable. IMPRESSION: Stable mild bilateral hazy densities are noted in both upper lobes which may represent scarring from prior inflammation, although superimposed acute inflammation cannot be excluded. Electronically Signed   By: Lupita Raider M.D.   On: 03/17/2020 11:58   ECHOCARDIOGRAM COMPLETE  Result Date: 03/18/2020    ECHOCARDIOGRAM REPORT   Patient Name:   Christian [name] Date of Exam: 03/18/2020 Medical Rec #:  [id number]        Height:       72.0 in Accession #:    [id number]       Weight:       275.1 lb Date of Birth:  [birth year]        BSA:          2.440 m Patient Age:    68 years         BP:           99/66 mmHg Patient Gender: M                HR:           93 bpm. Exam Location:  [facility] Procedure: 2D Echo, Cardiac Doppler and Color Doppler  Indications:     CHF- acute diastolic 428.31  History:         Patient has prior history of Echocardiogram examinations, most                  recent 06/19/2018. Risk Factors:Diabetes, Hypertension and Sleep                  Apnea. Bipolar 1 disorder.  Sonographer:     Cristela Blue RDCS (AE) Referring Phys:  [id number] Caryn Bee PACKER Diagnosing Phys: Adrian Blackwater MD  Sonographer Comments: Technically difficult study due to poor echo windows. IMPRESSIONS  1. Left ventricular ejection fraction, by estimation, is 50 to 55%. The left ventricle has low normal function. The left ventricle demonstrates global hypokinesis. Left ventricular diastolic parameters are consistent with Grade I diastolic  dysfunction (impaired relaxation).  2. Right ventricular systolic function is mildly reduced. The right ventricular size is moderately enlarged. There is normal pulmonary artery systolic pressure.  3. Left atrial size was mild to moderately dilated.  4. Right atrial size was mild to moderately dilated.  5. The mitral valve is degenerative. Mild to moderate mitral valve regurgitation. No evidence of mitral stenosis.  6. Supraaortic stenosis. The aortic valve is abnormal. Aortic valve regurgitation is mild. Mild to moderate aortic valve stenosis.  7. The inferior vena cava is normal in size with greater than 50% respiratory variability, suggesting right atrial pressure of 3 mmHg. FINDINGS  Left Ventricle: Left ventricular ejection fraction, by estimation, is 50 to 55%. The left ventricle has low normal function. The left ventricle demonstrates global hypokinesis. The left ventricular internal cavity size was normal in size. There is no left ventricular hypertrophy. Left ventricular diastolic parameters are consistent with Grade I diastolic dysfunction (impaired relaxation). Right Ventricle: The right ventricular size is moderately enlarged. No increase in right ventricular wall thickness. Right ventricular systolic function is mildly reduced. There is normal pulmonary artery systolic pressure. The tricuspid regurgitant velocity is 1.75 m/s, and with an assumed right atrial pressure of 10 mmHg, the estimated right ventricular systolic pressure is 22.2 mmHg. Left Atrium: Left atrial size was mild to moderately dilated. Right Atrium: Right atrial size was mild to moderately dilated. Pericardium: There is no evidence of pericardial effusion. Mitral Valve: The mitral valve is degenerative in appearance. There is severe calcification of the mitral valve leaflet(s). Moderately decreased mobility of the mitral valve leaflets. Moderate to severe mitral annular calcification. Mild to moderate mitral valve regurgitation. No  evidence of mitral valve stenosis. Tricuspid Valve: The tricuspid valve is normal in structure. Tricuspid valve regurgitation is mild . No evidence of tricuspid stenosis. Aortic Valve: Supraaortic stenosis. The aortic valve is abnormal. Aortic valve regurgitation is mild. Mild to moderate aortic stenosis is present. Aortic valve mean gradient measures 2.3 mmHg. Aortic valve peak gradient measures 4.6 mmHg. Aortic valve area, by VTI measures 2.31 cm. Pulmonic Valve: The pulmonic valve was normal in structure. Pulmonic valve regurgitation is mild. No evidence of pulmonic stenosis. Aorta: The aortic root is normal in size and structure. Venous: The inferior vena cava is normal in size with greater than 50% respiratory variability, suggesting right atrial pressure of 3 mmHg. IAS/Shunts: No atrial level shunt detected by color flow Doppler.  LEFT VENTRICLE PLAX 2D LVIDd:         4.88 cm  Diastology LVIDs:         3.56 cm  LV e' lateral:   14.00 cm/s LV PW:  1.24 cm  LV E/e' lateral: 4.8 LV IVS:        1.60 cm  LV e' medial:    5.66 cm/s LVOT diam:     2.00 cm  LV E/e' medial:  11.8 LV SV:         35 LV SV Index:   15 LVOT Area:     3.14 cm  RIGHT VENTRICLE RV Basal diam:  3.83 cm RV S prime:     15.90 cm/s TAPSE (M-mode): 2.5 cm LEFT ATRIUM           Index       RIGHT ATRIUM           Index LA diam:      4.20 cm 1.72 cm/m  RA Area:     14.40 cm LA Vol (A2C): 29.2 ml 11.97 ml/m RA Volume:   30.50 ml  12.50 ml/m LA Vol (A4C): 42.6 ml 17.46 ml/m  AORTIC VALVE AV Area (Vmax):    1.61 cm AV Area (Vmean):   1.48 cm AV Area (VTI):     2.31 cm AV Vmax:           107.00 cm/s AV Vmean:          69.133 cm/s AV VTI:            0.154 m AV Peak Grad:      4.6 mmHg AV Mean Grad:      2.3 mmHg LVOT Vmax:         54.70 cm/s LVOT Vmean:        32.600 cm/s LVOT VTI:          0.113 m LVOT/AV VTI ratio: 0.73  AORTA Ao Root diam: 3.10 cm MITRAL VALVE               TRICUSPID VALVE MV Area (PHT): 3.06 cm    TR Peak grad:    12.2 mmHg MV Decel Time: 248 msec    TR Vmax:        175.00 cm/s MV E velocity: 66.90 cm/s MV A velocity: 95.60 cm/s  SHUNTS MV E/A ratio:  0.70        Systemic VTI:  0.11 m                            Systemic Diam: 2.00 cm Adrian Blackwater MD Electronically signed by Adrian Blackwater MD Signature Date/Time: 03/18/2020/4:06:35 PM    Final     Subjective: No new complaints feels great.  Discharge Exam: Vitals:   03/21/20 0435 03/21/20 0744  BP: (!) 152/91 (!) 128/92  Pulse: 77 83  Resp: 18 19  Temp: 98.8 F (37.1 C) 98.7 F (37.1 C)  SpO2: 96% 94%   Vitals:   03/20/20 2000 03/21/20 0435 03/21/20 0445 03/21/20 0744  BP: (!) 173/92 (!) 152/91  (!) 128/92  Pulse: 81 77  83  Resp: Temp: 97.8 F (36.6 C) 98.8 F (37.1 C)  98.7 F (37.1 C)  TempSrc: Oral Oral  Oral  SpO2: 100% 96%  94%  Weight:   121.1 kg   Height:        General: Pt is alert, awake, not in acute distress Cardiovascular: RRR, S1/S2 +, no rubs, no gallops Respiratory: CTA bilaterally, no wheezing, no rhonchi Abdominal: Soft, NT, ND, bowel sounds + Extremities: no edema, no cyanosis    The results of significant diagnostics from this  hospitalization (including imaging, microbiology, ancillary and laboratory) are listed below for reference.     Microbiology: Recent Results (from the past 240 hour(s))  SARS CORONAVIRUS 2 (TAT 6-24 HRS) Nasopharyngeal Nasopharyngeal Swab     Status: None   Collection Time: 03/14/20 12:29 PM   Specimen: Nasopharyngeal Swab  Result Value Ref Range Status   SARS Coronavirus 2 NEGATIVE NEGATIVE Final    Comment: (NOTE) SARS-CoV-2 target nucleic acids are NOT DETECTED. The SARS-CoV-2 RNA is generally detectable in upper and lower respiratory specimens during the acute phase of infection. Negative results do not preclude SARS-CoV-2 infection, do not rule out co-infections with other pathogens, and should not be used as the sole basis for treatment or other patient management  decisions. Negative results must be combined with clinical observations, patient history, and epidemiological information. The expected result is Negative. Fact Sheet for Patients: HairSlick.no Fact Sheet for Healthcare Providers: quierodirigir.com This test is not yet approved or cleared by the [place] FDA and  has been authorized for detection and/or diagnosis of SARS-CoV-2 by FDA under an Emergency Use Authorization (EUA). This EUA will remain  in effect (meaning this test can be used) for the duration of the COVID-19 declaration under Section 56 4(b)(1) of the Act, 21 U.S.C. section 360bbb-3(b)(1), unless the authorization is terminated or revoked sooner. Performed at [facility], [address] [place], [place] [address]   Respiratory Panel by RT PCR (Flu A&B, Covid) - Nasopharyngeal Swab     Status: None   Collection Time: 03/17/20  4:55 PM   Specimen: Nasopharyngeal Swab  Result Value Ref Range Status   SARS Coronavirus 2 by RT PCR NEGATIVE NEGATIVE Final    Comment: (NOTE) SARS-CoV-2 target nucleic acids are NOT DETECTED. The SARS-CoV-2 RNA is generally detectable in upper respiratoy specimens during the acute phase of infection. The lowest concentration of SARS-CoV-2 viral copies this assay can detect is 131 copies/mL. A negative result does not preclude SARS-Cov-2 infection and should not be used as the sole basis for treatment or other patient management decisions. A negative result may occur with  improper specimen collection/handling, submission of specimen other than nasopharyngeal swab, presence of viral mutation(s) within the areas targeted by this assay, and inadequate number of viral copies (<131 copies/mL). A negative result must be combined with clinical observations, patient history, and epidemiological information. The expected result is Negative. Fact Sheet for Patients:   https://www.moore.com/ Fact Sheet for Healthcare Providers:  https://www.young.biz/ This test is not yet ap proved or cleared by the [place] FDA and  has been authorized for detection and/or diagnosis of SARS-CoV-2 by FDA under an Emergency Use Authorization (EUA). This EUA will remain  in effect (meaning this test can be used) for the duration of the COVID-19 declaration under Section 564(b)(1) of the Act, 21 U.S.C. section 360bbb-3(b)(1), unless the authorization is terminated or revoked sooner.    Influenza A by PCR NEGATIVE NEGATIVE Final   Influenza B by PCR NEGATIVE NEGATIVE Final    Comment: (NOTE) The Xpert Xpress SARS-CoV-2/FLU/RSV assay is intended as an aid in  the diagnosis of influenza from Nasopharyngeal swab specimens and  should not be used as a sole basis for treatment. Nasal washings and  aspirates are unacceptable for Xpert Xpress SARS-CoV-2/FLU/RSV  testing. Fact Sheet for Patients: https://www.moore.com/ Fact Sheet for Healthcare Providers: https://www.young.biz/ This test is not yet approved or cleared by the [place] FDA and  has been authorized for detection and/or diagnosis of SARS-CoV-2  by  FDA under an Emergency Use Authorization (EUA). This EUA will remain  in effect (meaning this test can be used) for the duration of the  Covid-19 declaration under Section 564(b)(1) of the Act, 21  U.S.C. section 360bbb-3(b)(1), unless the authorization is  terminated or revoked. Performed at [facility], [address] [place], [place] [address]   CULTURE, BLOOD (ROUTINE X 2) w Reflex to ID Panel     Status: None (Preliminary result)   Collection Time: 03/17/20  5:35 PM   Specimen: BLOOD  Result Value Ref Range Status   Specimen Description BLOOD LAC  Final   Special Requests   Final    BOTTLES DRAWN AEROBIC AND ANAEROBIC Blood Culture adequate volume   Culture    Final    NO GROWTH 4 DAYS Performed at [facility], [address] [place], [place] [address]    Report Status PENDING  Incomplete  CULTURE, BLOOD (ROUTINE X 2) w Reflex to ID Panel     Status: None (Preliminary result)   Collection Time: 03/17/20  8:41 PM   Specimen: BLOOD  Result Value Ref Range Status   Specimen Description BLOOD R HAND  Final   Special Requests   Final    BOTTLES DRAWN AEROBIC AND ANAEROBIC Blood Culture adequate volume   Culture   Final    NO GROWTH 4 DAYS Performed at [facility], [address] [place], [place] [address]    Report Status PENDING  Incomplete     Labs: BNP (last 3 results) Recent Labs    03/19/20 0936  BNP 65.0   Basic Metabolic Panel: Recent Labs  Lab 03/17/20 1329 03/18/20 0455 03/19/20 0616 03/20/20 0611 03/21/20 0445  NA 133* 134* 135 139 136  K 4.9 4.3 4.5 4.5 4.6  CL 97* 98 100 104 101  CO2 22 26 28 28 25   GLUCOSE 264* 143* 160* 130* 143*  BUN 20 27* 32* 23 22  CREATININE 1.49* 1.94* 1.78* 1.36* 1.47*  CALCIUM 8.8* 8.3* 8.3* 9.0 8.5*   Liver Function Tests: No results for input(s): AST, ALT, ALKPHOS, BILITOT, PROT, ALBUMIN in the last 168 hours. No results for input(s): LIPASE, AMYLASE in the last 168 hours. No results for input(s): AMMONIA in the last 168 hours. CBC: Recent Labs  Lab 03/17/20 1329 03/20/20 0611  WBC 7.0 6.9  HGB 14.5 12.1*  HCT 48.0 38.3*  MCV 84.2 82.4  PLT 167 176   Cardiac Enzymes: No results for input(s): CKTOTAL, CKMB, CKMBINDEX, TROPONINI in the last 168 hours. BNP: Invalid input(s): POCBNP CBG: Recent Labs  Lab 03/19/20 0725 03/19/20 1249 03/19/20 1644 03/19/20 2119 03/20/20 0735  GLUCAP 112* 142* 127* 118* 97   D-Dimer No results for input(s): DDIMER in the last 72 hours. Hgb A1c No results for input(s): HGBA1C in the last 72 hours. Lipid Profile No results for input(s): CHOL, HDL, LDLCALC, TRIG, CHOLHDL, LDLDIRECT in the last 72 hours. Thyroid  function studies No results for input(s): TSH, T4TOTAL, T3FREE, THYROIDAB in the last 72 hours.  Invalid input(s): FREET3 Anemia work up No results for input(s): VITAMINB12, FOLATE, FERRITIN, TIBC, IRON, RETICCTPCT in the last 72 hours. Urinalysis    Component Value Date/Time   COLORURINE YELLOW 11/01/2019 1300   APPEARANCEUR CLEAR 11/01/2019 1300   APPEARANCEUR Cloudy 08/01/2014 1614   LABSPEC 1.010 11/01/2019 1300   LABSPEC 1.008 08/01/2014 1614   PHURINE 5.0 11/01/2019 1300   GLUCOSEU 50 (A) 11/01/2019 1300   GLUCOSEU Negative  08/01/2014 1614   HGBUR SMALL (A) 11/01/2019 1300   BILIRUBINUR NEGATIVE 11/01/2019 1300   BILIRUBINUR Negative 08/01/2014 1614   KETONESUR NEGATIVE 11/01/2019 1300   PROTEINUR NEGATIVE 11/01/2019 1300   UROBILINOGEN 0.2 07/01/2014 1809   NITRITE NEGATIVE 11/01/2019 1300   LEUKOCYTESUR SMALL (A) 11/01/2019 1300   LEUKOCYTESUR Negative 08/01/2014 1614   Sepsis Labs Invalid input(s): PROCALCITONIN,  WBC,  LACTICIDVEN Microbiology Recent Results (from the past 240 hour(s))  SARS CORONAVIRUS 2 (TAT 6-24 HRS) Nasopharyngeal Nasopharyngeal Swab     Status: None   Collection Time: 03/14/20 12:29 PM   Specimen: Nasopharyngeal Swab  Result Value Ref Range Status   SARS Coronavirus 2 NEGATIVE NEGATIVE Final    Comment: (NOTE) SARS-CoV-2 target nucleic acids are NOT DETECTED. The SARS-CoV-2 RNA is generally detectable in upper and lower respiratory specimens during the acute phase of infection. Negative results do not preclude SARS-CoV-2 infection, do not rule out co-infections with other pathogens, and should not be used as the sole basis for treatment or other patient management decisions. Negative results must be combined with clinical observations, patient history, and epidemiological information. The expected result is Negative. Fact Sheet for Patients: HairSlick.no Fact Sheet for Healthcare  Providers: quierodirigir.com This test is not yet approved or cleared by the [place] FDA and  has been authorized for detection and/or diagnosis of SARS-CoV-2 by FDA under an Emergency Use Authorization (EUA). This EUA will remain  in effect (meaning this test can be used) for the duration of the COVID-19 declaration under Section 56 4(b)(1) of the Act, 21 U.S.C. section 360bbb-3(b)(1), unless the authorization is terminated or revoked sooner. Performed at [facility], [address], [place], [place] [address]   Respiratory Panel by RT PCR (Flu A&B, Covid) - Nasopharyngeal Swab     Status: None   Collection Time: 03/17/20  4:55 PM   Specimen: Nasopharyngeal Swab  Result Value Ref Range Status   SARS Coronavirus 2 by RT PCR NEGATIVE NEGATIVE Final    Comment: (NOTE) SARS-CoV-2 target nucleic acids are NOT DETECTED. The SARS-CoV-2 RNA is generally detectable in upper respiratoy specimens during the acute phase of infection. The lowest concentration of SARS-CoV-2 viral copies this assay can detect is 131 copies/mL. A negative result does not preclude SARS-Cov-2 infection and should not be used as the sole basis for treatment or other patient management decisions. A negative result may occur with  improper specimen collection/handling, submission of specimen other than nasopharyngeal swab, presence of viral mutation(s) within the areas targeted by this assay, and inadequate number of viral copies (<131 copies/mL). A negative result must be combined with clinical observations, patient history, and epidemiological information. The expected result is Negative. Fact Sheet for Patients:  https://www.moore.com/ Fact Sheet for Healthcare Providers:  https://www.young.biz/ This test is not yet ap proved or cleared by the [place] FDA and  has been authorized for detection and/or diagnosis of SARS-CoV-2 by FDA  under an Emergency Use Authorization (EUA). This EUA will remain  in effect (meaning this test can be used) for the duration of the COVID-19 declaration under Section 564(b)(1) of the Act, 21 U.S.C. section 360bbb-3(b)(1), unless the authorization is terminated or revoked sooner.    Influenza A by PCR NEGATIVE NEGATIVE Final   Influenza B by PCR NEGATIVE NEGATIVE Final    Comment: (NOTE) The Xpert Xpress SARS-CoV-2/FLU/RSV assay is intended as an aid in  the diagnosis of influenza from Nasopharyngeal swab specimens and  should not be used as  a sole basis for treatment. Nasal washings and  aspirates are unacceptable for Xpert Xpress SARS-CoV-2/FLU/RSV  testing. Fact Sheet for Patients: https://www.moore.com/ Fact Sheet for Healthcare Providers: https://www.young.biz/ This test is not yet approved or cleared by the [place] FDA and  has been authorized for detection and/or diagnosis of SARS-CoV-2 by  FDA under an Emergency Use Authorization (EUA). This EUA will remain  in effect (meaning this test can be used) for the duration of the  Covid-19 declaration under Section 564(b)(1) of the Act, 21  U.S.C. section 360bbb-3(b)(1), unless the authorization is  terminated or revoked. Performed at [facility], [address] [place], [place] [address]   CULTURE, BLOOD (ROUTINE X 2) w Reflex to ID Panel     Status: None (Preliminary result)   Collection Time: 03/17/20  5:35 PM   Specimen: BLOOD  Result Value Ref Range Status   Specimen Description BLOOD LAC  Final   Special Requests   Final    BOTTLES DRAWN AEROBIC AND ANAEROBIC Blood Culture adequate volume   Culture   Final    NO GROWTH 4 DAYS Performed at [facility], [address] [place], [place] [address]    Report Status PENDING  Incomplete  CULTURE, BLOOD (ROUTINE X 2) w Reflex to ID Panel     Status: None (Preliminary result)   Collection Time: 03/17/20  8:41 PM    Specimen: BLOOD  Result Value Ref Range Status   Specimen Description BLOOD R HAND  Final   Special Requests   Final    BOTTLES DRAWN AEROBIC AND ANAEROBIC Blood Culture adequate volume   Culture   Final    NO GROWTH 4 DAYS Performed at [facility], [address] [place], [place] [address]    Report Status PENDING  Incomplete     Time coordinating discharge: Over 40 minutes  SIGNED:   Marinda Elk, MD  [facility] 03/21/2020, 7:56 AM Pager   If 7PM-7AM, please contact night-coverage [email or web address] Password [email or web address]

## 2020-03-21 NOTE — Progress Notes (Signed)
SUBJECTIVE: Patient resting comfortably.  Denies shortness of breath or chest pain.   Vitals:   03/20/20 2000 03/21/20 0435 03/21/20 0445 03/21/20 0744  BP: (!) 173/92 (!) 152/91  (!) 128/92  Pulse: 81 77  83  Resp: 20 18  19   Temp: 97.8 F (36.6 C) 98.8 F (37.1 C)  98.7 F (37.1 C)  TempSrc: Oral Oral  Oral  SpO2: 100% 96%  94%  Weight:   121.1 kg   Height:        Intake/Output Summary (Last 24 hours) at 03/21/2020 0930 Last data filed at 03/21/2020 0441 Gross per 24 hour  Intake 820.62 ml  Output 1625 ml  Net -804.38 ml    LABS: Basic Metabolic Panel: Recent Labs    03/20/20 0611 03/21/20 0445  NA 139 136  K 4.5 4.6  CL 104 101  CO2 28 25  GLUCOSE 130* 143*  BUN 23 22  CREATININE 1.36* 1.47*  CALCIUM 9.0 8.5*   Liver Function Tests: No results for input(s): AST, ALT, ALKPHOS, BILITOT, PROT, ALBUMIN in the last 72 hours. No results for input(s): LIPASE, AMYLASE in the last 72 hours. CBC: Recent Labs    03/20/20 0611  WBC 6.9  HGB 12.1*  HCT 38.3*  MCV 82.4  PLT 176   Cardiac Enzymes: No results for input(s): CKTOTAL, CKMB, CKMBINDEX, TROPONINI in the last 72 hours. BNP: Invalid input(s): POCBNP D-Dimer: No results for input(s): DDIMER in the last 72 hours. Hemoglobin A1C: No results for input(s): HGBA1C in the last 72 hours. Fasting Lipid Panel: No results for input(s): CHOL, HDL, LDLCALC, TRIG, CHOLHDL, LDLDIRECT in the last 72 hours. Thyroid Function Tests: No results for input(s): TSH, T4TOTAL, T3FREE, THYROIDAB in the last 72 hours.  Invalid input(s): FREET3 Anemia Panel: No results for input(s): VITAMINB12, FOLATE, FERRITIN, TIBC, IRON, RETICCTPCT in the last 72 hours.   PHYSICAL EXAM General: Well developed, well nourished, in no acute distress HEENT:  Normocephalic and atramatic Neck:  No JVD.  Lungs: Clear bilaterally to auscultation and percussion. Heart: HRRR . Normal S1 and S2 without gallops or murmurs.  Abdomen: Bowel sounds  are positive, abdomen soft and non-tender  Msk:  Back normal, normal gait. Normal strength and tone for age. Extremities: +2 BLE.   Neuro: Alert and oriented X 3. Psych:  Good affect, responds appropriately   ASSESSMENT AND PLAN: Patient admitted to the hospital with acute on chronic HFpEF with respiratory distressvs PNA afterelectroconvulsive therapy. Patient is on room air. Echocardiogram shows EF 50 to 55%, left ventricular global hypokinesis with grade 1 diastolic dysfunction, right ventricle moderately enlarged, moderate MR/AS.Blood pressure mildly elevated this morning with mild increase in Cr to 1.47.  Would recommend continuing the patient's lisinopril dose to 20 mg daily while encouraging fluid intake and increasing metoprolol succinate to 50 mg daily with continued tachycardia.  With the patient's continuing improvement of kidney function will most likely start on spironolactone as an outpatient to optimize the patient's HFpEF. Please continue his home dose of Xarelto. We will continue to monitor and assist in optimizing patient's current HFpEF.  From a cardiac point of view the patient is stable and ready for discharge.  Please ensure that the patient has a follow-up appointment with [facility] on Tuesday, April 27.  Active Problems:   Hypertension   OSA on CPAP   Bipolar affective disorder, depressed, mild (HCC)   Bilateral pulmonary embolism (HCC)   Acute respiratory failure with hypoxia (HCC)   Paroxysmal atrial fibrillation (  HCC)   Chronic diastolic CHF (congestive heart failure) (HCC)   Sacral decubitus ulcer, stage II (HCC)   Aspiration pneumonia of both lower lobes due to gastric secretions (HCC)    Maryelizabeth Kaufmann, NP-C 03/21/2020 9:30 AM

## 2020-03-22 LAB — CULTURE, BLOOD (ROUTINE X 2)
Culture: NO GROWTH
Culture: NO GROWTH
Special Requests: ADEQUATE
Special Requests: ADEQUATE

## 2020-03-26 ENCOUNTER — Telehealth: Payer: Self-pay | Admitting: Family

## 2020-03-26 ENCOUNTER — Telehealth: Payer: Self-pay

## 2020-03-26 NOTE — Telephone Encounter (Signed)
Spoke to patient and legal guardian who stated patient is doing well. He is checking his weight daily at home, following a low sodium diet, starting PT for exercise and taking medicaitons as prescribed. Patient confirmed new patient appointment time for 5/4.   Deetta Perla, [place]

## 2020-03-27 ENCOUNTER — Other Ambulatory Visit
Admission: RE | Admit: 2020-03-27 | Discharge: 2020-03-27 | Disposition: A | Discharge status: Home / Self Care | Payer: Medicare PPO | Source: Ambulatory Visit | Attending: Psychiatry | Admitting: Psychiatry

## 2020-03-27 ENCOUNTER — Other Ambulatory Visit: Payer: Self-pay

## 2020-03-27 DIAGNOSIS — Z01812 Encounter for preprocedural laboratory examination: Secondary | ICD-10-CM | POA: Diagnosis present

## 2020-03-27 DIAGNOSIS — Z20822 Contact with and (suspected) exposure to covid-19: Secondary | ICD-10-CM | POA: Insufficient documentation

## 2020-03-27 LAB — SARS CORONAVIRUS 2 (TAT 6-24 HRS): SARS Coronavirus 2: NEGATIVE

## 2020-03-28 ENCOUNTER — Telehealth: Payer: Self-pay

## 2020-03-30 ENCOUNTER — Other Ambulatory Visit: Payer: Self-pay | Admitting: Psychiatry

## 2020-03-31 ENCOUNTER — Telehealth: Payer: Self-pay

## 2020-03-31 ENCOUNTER — Encounter: Payer: Self-pay | Admitting: Emergency Medicine

## 2020-03-31 ENCOUNTER — Other Ambulatory Visit: Payer: Self-pay

## 2020-03-31 ENCOUNTER — Inpatient Hospital Stay
Admission: EM | Admit: 2020-03-31 | Discharge: 2020-04-01 | DRG: 280 | Disposition: A | Discharge status: Home / Self Care | Payer: Medicare PPO | Attending: Internal Medicine | Admitting: Internal Medicine

## 2020-03-31 ENCOUNTER — Inpatient Hospital Stay: Admission: RE | Admit: 2020-03-31 | Payer: Medicare PPO | Source: Ambulatory Visit

## 2020-03-31 ENCOUNTER — Emergency Department: Payer: Medicare PPO

## 2020-03-31 DIAGNOSIS — Z8261 Family history of arthritis: Secondary | ICD-10-CM

## 2020-03-31 DIAGNOSIS — Z833 Family history of diabetes mellitus: Secondary | ICD-10-CM | POA: Diagnosis not present

## 2020-03-31 DIAGNOSIS — Z6838 Body mass index (BMI) 38.0-38.9, adult: Secondary | ICD-10-CM | POA: Diagnosis not present

## 2020-03-31 DIAGNOSIS — E1165 Type 2 diabetes mellitus with hyperglycemia: Secondary | ICD-10-CM | POA: Diagnosis present

## 2020-03-31 DIAGNOSIS — E1121 Type 2 diabetes mellitus with diabetic nephropathy: Secondary | ICD-10-CM | POA: Diagnosis not present

## 2020-03-31 DIAGNOSIS — I878 Other specified disorders of veins: Secondary | ICD-10-CM | POA: Diagnosis present

## 2020-03-31 DIAGNOSIS — I130 Hypertensive heart and chronic kidney disease with heart failure and stage 1 through stage 4 chronic kidney disease, or unspecified chronic kidney disease: Secondary | ICD-10-CM | POA: Diagnosis not present

## 2020-03-31 DIAGNOSIS — I214 Non-ST elevation (NSTEMI) myocardial infarction: Secondary | ICD-10-CM | POA: Diagnosis not present

## 2020-03-31 DIAGNOSIS — N1831 Chronic kidney disease, stage 3a: Secondary | ICD-10-CM | POA: Diagnosis not present

## 2020-03-31 DIAGNOSIS — I2510 Atherosclerotic heart disease of native coronary artery without angina pectoris: Secondary | ICD-10-CM | POA: Diagnosis present

## 2020-03-31 DIAGNOSIS — Z79899 Other long term (current) drug therapy: Secondary | ICD-10-CM

## 2020-03-31 DIAGNOSIS — E039 Hypothyroidism, unspecified: Secondary | ICD-10-CM | POA: Diagnosis present

## 2020-03-31 DIAGNOSIS — G4733 Obstructive sleep apnea (adult) (pediatric): Secondary | ICD-10-CM | POA: Diagnosis present

## 2020-03-31 DIAGNOSIS — I2584 Coronary atherosclerosis due to calcified coronary lesion: Secondary | ICD-10-CM | POA: Diagnosis present

## 2020-03-31 DIAGNOSIS — E1122 Type 2 diabetes mellitus with diabetic chronic kidney disease: Secondary | ICD-10-CM | POA: Diagnosis not present

## 2020-03-31 DIAGNOSIS — I10 Essential (primary) hypertension: Secondary | ICD-10-CM | POA: Diagnosis present

## 2020-03-31 DIAGNOSIS — E66813 Obesity, class 3: Secondary | ICD-10-CM | POA: Diagnosis present

## 2020-03-31 DIAGNOSIS — I249 Acute ischemic heart disease, unspecified: Secondary | ICD-10-CM

## 2020-03-31 DIAGNOSIS — F319 Bipolar disorder, unspecified: Secondary | ICD-10-CM | POA: Diagnosis not present

## 2020-03-31 DIAGNOSIS — I5032 Chronic diastolic (congestive) heart failure: Secondary | ICD-10-CM | POA: Diagnosis present

## 2020-03-31 DIAGNOSIS — R079 Chest pain, unspecified: Secondary | ICD-10-CM | POA: Diagnosis not present

## 2020-03-31 DIAGNOSIS — Z7901 Long term (current) use of anticoagulants: Secondary | ICD-10-CM

## 2020-03-31 DIAGNOSIS — I480 Paroxysmal atrial fibrillation: Secondary | ICD-10-CM | POA: Diagnosis present

## 2020-03-31 DIAGNOSIS — Z86711 Personal history of pulmonary embolism: Secondary | ICD-10-CM

## 2020-03-31 DIAGNOSIS — R600 Localized edema: Secondary | ICD-10-CM | POA: Diagnosis present

## 2020-03-31 DIAGNOSIS — Z8701 Personal history of pneumonia (recurrent): Secondary | ICD-10-CM

## 2020-03-31 DIAGNOSIS — Z8249 Family history of ischemic heart disease and other diseases of the circulatory system: Secondary | ICD-10-CM

## 2020-03-31 DIAGNOSIS — R0602 Shortness of breath: Secondary | ICD-10-CM | POA: Diagnosis not present

## 2020-03-31 DIAGNOSIS — Z20822 Contact with and (suspected) exposure to covid-19: Secondary | ICD-10-CM | POA: Diagnosis not present

## 2020-03-31 DIAGNOSIS — J9601 Acute respiratory failure with hypoxia: Secondary | ICD-10-CM | POA: Diagnosis not present

## 2020-03-31 DIAGNOSIS — Z7984 Long term (current) use of oral hypoglycemic drugs: Secondary | ICD-10-CM | POA: Diagnosis not present

## 2020-03-31 DIAGNOSIS — F3131 Bipolar disorder, current episode depressed, mild: Secondary | ICD-10-CM | POA: Diagnosis present

## 2020-03-31 DIAGNOSIS — E118 Type 2 diabetes mellitus with unspecified complications: Secondary | ICD-10-CM | POA: Diagnosis not present

## 2020-03-31 DIAGNOSIS — N183 Chronic kidney disease, stage 3 unspecified: Secondary | ICD-10-CM | POA: Diagnosis present

## 2020-03-31 LAB — COMPREHENSIVE METABOLIC PANEL
ALT: 11 U/L (ref 0–44)
AST: 14 U/L — ABNORMAL LOW (ref 15–41)
Albumin: 2.8 g/dL — ABNORMAL LOW (ref 3.5–5.0)
Alkaline Phosphatase: 59 U/L (ref 38–126)
Anion gap: 6 (ref 5–15)
BUN: 20 mg/dL (ref 8–23)
CO2: 31 mmol/L (ref 22–32)
Calcium: 10.7 mg/dL — ABNORMAL HIGH (ref 8.9–10.3)
Chloride: 100 mmol/L (ref 98–111)
Creatinine, Ser: 1.95 mg/dL — ABNORMAL HIGH (ref 0.61–1.24)
GFR calc Af Amer: 40 mL/min — ABNORMAL LOW (ref >60)
GFR calc non Af Amer: 35 mL/min — ABNORMAL LOW (ref >60)
Glucose, Bld: 169 mg/dL — ABNORMAL HIGH (ref 70–99)
Potassium: 4.1 mmol/L (ref 3.5–5.1)
Sodium: 137 mmol/L (ref 135–145)
Total Bilirubin: 0.6 mg/dL (ref 0.3–1.2)
Total Protein: 6.6 g/dL (ref 6.5–8.1)

## 2020-03-31 LAB — BASIC METABOLIC PANEL
Anion gap: 6 (ref 5–15)
BUN: 18 mg/dL (ref 8–23)
CO2: 28 mmol/L (ref 22–32)
Calcium: 9.2 mg/dL (ref 8.9–10.3)
Chloride: 103 mmol/L (ref 98–111)
Creatinine, Ser: 1.55 mg/dL — ABNORMAL HIGH (ref 0.61–1.24)
GFR calc Af Amer: 53 mL/min — ABNORMAL LOW (ref >60)
GFR calc non Af Amer: 46 mL/min — ABNORMAL LOW (ref >60)
Glucose, Bld: 155 mg/dL — ABNORMAL HIGH (ref 70–99)
Potassium: 3.6 mmol/L (ref 3.5–5.1)
Sodium: 137 mmol/L (ref 135–145)

## 2020-03-31 LAB — BRAIN NATRIURETIC PEPTIDE: B Natriuretic Peptide: 138 pg/mL — ABNORMAL HIGH (ref 0.0–100.0)

## 2020-03-31 LAB — CBC WITH DIFFERENTIAL/PLATELET
Abs Immature Granulocytes: 0.02 10*3/uL (ref 0.00–0.07)
Basophils Absolute: 0 10*3/uL (ref 0.0–0.1)
Basophils Relative: 1 %
Eosinophils Absolute: 0.1 10*3/uL (ref 0.0–0.5)
Eosinophils Relative: 3 %
HCT: 38.5 % — ABNORMAL LOW (ref 39.0–52.0)
Hemoglobin: 12.1 g/dL — ABNORMAL LOW (ref 13.0–17.0)
Immature Granulocytes: 0 %
Lymphocytes Relative: 26 %
Lymphs Abs: 1.3 10*3/uL (ref 0.7–4.0)
MCH: 25.9 pg — ABNORMAL LOW (ref 26.0–34.0)
MCHC: 31.4 g/dL (ref 30.0–36.0)
MCV: 82.3 fL (ref 80.0–100.0)
Monocytes Absolute: 0.4 10*3/uL (ref 0.1–1.0)
Monocytes Relative: 8 %
Neutro Abs: 3.2 10*3/uL (ref 1.7–7.7)
Neutrophils Relative %: 62 %
Platelets: 207 10*3/uL (ref 150–400)
RBC: 4.68 MIL/uL (ref 4.22–5.81)
RDW: 14.6 % (ref 11.5–15.5)
WBC: 5.1 10*3/uL (ref 4.0–10.5)
nRBC: 0 % (ref 0.0–0.2)

## 2020-03-31 LAB — CBC
HCT: 36 % — ABNORMAL LOW (ref 39.0–52.0)
Hemoglobin: 11.5 g/dL — ABNORMAL LOW (ref 13.0–17.0)
MCH: 25.9 pg — ABNORMAL LOW (ref 26.0–34.0)
MCHC: 31.9 g/dL (ref 30.0–36.0)
MCV: 81.1 fL (ref 80.0–100.0)
Platelets: 187 10*3/uL (ref 150–400)
RBC: 4.44 MIL/uL (ref 4.22–5.81)
RDW: 14.6 % (ref 11.5–15.5)
WBC: 4.9 10*3/uL (ref 4.0–10.5)
nRBC: 0 % (ref 0.0–0.2)

## 2020-03-31 LAB — TROPONIN I (HIGH SENSITIVITY)
Troponin I (High Sensitivity): 755 ng/L (ref <18)
Troponin I (High Sensitivity): 852 ng/L (ref <18)
Troponin I (High Sensitivity): 481 ng/L (ref <18)
Troponin I (High Sensitivity): 392 ng/L (ref <18)

## 2020-03-31 LAB — GLUCOSE, CAPILLARY
Glucose-Capillary: 76 mg/dL (ref 70–99)
Glucose-Capillary: 123 mg/dL — ABNORMAL HIGH (ref 70–99)
Glucose-Capillary: 118 mg/dL — ABNORMAL HIGH (ref 70–99)

## 2020-03-31 LAB — HEMOGLOBIN A1C
Hgb A1c MFr Bld: 8.7 % — ABNORMAL HIGH (ref 4.8–5.6)
Mean Plasma Glucose: 202.99 mg/dL

## 2020-03-31 LAB — HEPARIN LEVEL (UNFRACTIONATED): Heparin Unfractionated: 2.96 IU/mL — ABNORMAL HIGH (ref 0.30–0.70)

## 2020-03-31 LAB — PROTIME-INR
INR: 1.4 — ABNORMAL HIGH (ref 0.8–1.2)
Prothrombin Time: 16.4 seconds — ABNORMAL HIGH (ref 11.4–15.2)

## 2020-03-31 LAB — RESPIRATORY PANEL BY RT PCR (FLU A&B, COVID)
Influenza A by PCR: NEGATIVE
Influenza B by PCR: NEGATIVE
SARS Coronavirus 2 by RT PCR: NEGATIVE

## 2020-03-31 LAB — APTT: aPTT: 70 seconds — ABNORMAL HIGH (ref 24–36)

## 2020-03-31 MED ORDER — QUETIAPINE FUMARATE 300 MG PO TABS
300.0000 mg | ORAL_TABLET | Freq: Every day | ORAL | Status: DC
  Administered 2020-03-31: 22:00:00 300 mg via ORAL
  Filled 2020-03-31 (×2): qty 1

## 2020-03-31 MED ORDER — ASPIRIN 81 MG PO CHEW
81.0000 mg | CHEWABLE_TABLET | ORAL | Status: AC
  Administered 2020-04-01: 05:00:00 81 mg via ORAL
  Filled 2020-03-31: qty 1

## 2020-03-31 MED ORDER — SODIUM CHLORIDE 0.9 % WEIGHT BASED INFUSION
1.0000 mL/kg/h | INTRAVENOUS | Status: DC
  Administered 2020-04-01: 06:00:00 1 mL/kg/h via INTRAVENOUS

## 2020-03-31 MED ORDER — SODIUM CHLORIDE 0.9 % IV SOLN: 250.0000 mL | INTRAVENOUS | Status: DC | PRN

## 2020-03-31 MED ORDER — SODIUM CHLORIDE 0.9 % IV SOLN: Freq: Once | INTRAVENOUS | Status: AC

## 2020-03-31 MED ORDER — LISINOPRIL 20 MG PO TABS
40.0000 mg | ORAL_TABLET | Freq: Every day | ORAL | Status: DC
  Administered 2020-03-31: 15:00:00 40 mg via ORAL
  Filled 2020-03-31: qty 4

## 2020-03-31 MED ORDER — LIOTHYRONINE SODIUM 25 MCG PO TABS
25.0000 ug | ORAL_TABLET | Freq: Every day | ORAL | Status: DC
  Filled 2020-03-31 (×2): qty 1

## 2020-03-31 MED ORDER — SODIUM CHLORIDE 0.9% FLUSH: 3.0000 mL | INTRAVENOUS | Status: DC | PRN

## 2020-03-31 MED ORDER — SODIUM CHLORIDE 0.9 % WEIGHT BASED INFUSION
3.0000 mL/kg/h | INTRAVENOUS | Status: AC
  Administered 2020-04-01: 05:00:00 3 mL/kg/h via INTRAVENOUS

## 2020-03-31 MED ORDER — ATORVASTATIN CALCIUM 10 MG PO TABS
10.0000 mg | ORAL_TABLET | Freq: Every day | ORAL | Status: DC
  Administered 2020-03-31 – 2020-04-01 (×2): 10 mg via ORAL
  Filled 2020-03-31 (×2): qty 1

## 2020-03-31 MED ORDER — ASPIRIN 81 MG PO CHEW: 324.0000 mg | CHEWABLE_TABLET | ORAL | Status: AC

## 2020-03-31 MED ORDER — ARIPIPRAZOLE 10 MG PO TABS
20.0000 mg | ORAL_TABLET | Freq: Every day | ORAL | Status: DC
  Filled 2020-03-31 (×2): qty 2

## 2020-03-31 MED ORDER — INSULIN ASPART 100 UNIT/ML ~~LOC~~ SOLN
0.0000 [IU] | Freq: Three times a day (TID) | SUBCUTANEOUS | Status: DC
  Administered 2020-03-31: 18:00:00 3 [IU] via SUBCUTANEOUS
  Administered 2020-04-01: 17:00:00 4 [IU] via SUBCUTANEOUS
  Filled 2020-03-31 (×2): qty 1

## 2020-03-31 MED ORDER — ACETAMINOPHEN 500 MG PO TABS
1000.0000 mg | ORAL_TABLET | Freq: Two times a day (BID) | ORAL | Status: DC
  Administered 2020-03-31: 22:00:00 1000 mg via ORAL
  Filled 2020-03-31: qty 2

## 2020-03-31 MED ORDER — NITROGLYCERIN 0.4 MG SL SUBL: 0.4000 mg | SUBLINGUAL_TABLET | SUBLINGUAL | Status: DC | PRN

## 2020-03-31 MED ORDER — SODIUM CHLORIDE 0.9% FLUSH
3.0000 mL | Freq: Two times a day (BID) | INTRAVENOUS | Status: DC
  Administered 2020-03-31: 22:00:00 3 mL via INTRAVENOUS

## 2020-03-31 MED ORDER — ASPIRIN 81 MG PO CHEW
324.0000 mg | CHEWABLE_TABLET | Freq: Once | ORAL | Status: AC
  Administered 2020-03-31: 11:00:00 324 mg via ORAL
  Filled 2020-03-31: qty 4

## 2020-03-31 MED ORDER — QUETIAPINE FUMARATE 25 MG PO TABS
25.0000 mg | ORAL_TABLET | Freq: Two times a day (BID) | ORAL | Status: DC
  Administered 2020-03-31 – 2020-04-01 (×2): 25 mg via ORAL
  Filled 2020-03-31 (×2): qty 1

## 2020-03-31 MED ORDER — ASPIRIN 300 MG RE SUPP: 300.0000 mg | RECTAL | Status: AC

## 2020-03-31 MED ORDER — METOPROLOL SUCCINATE ER 50 MG PO TB24
50.0000 mg | ORAL_TABLET | Freq: Every day | ORAL | Status: DC
  Filled 2020-03-31: qty 1

## 2020-03-31 MED ORDER — INSULIN ASPART 100 UNIT/ML ~~LOC~~ SOLN
6.0000 [IU] | Freq: Three times a day (TID) | SUBCUTANEOUS | Status: DC
  Administered 2020-03-31 – 2020-04-01 (×2): 6 [IU] via SUBCUTANEOUS
  Filled 2020-03-31 (×2): qty 1

## 2020-03-31 MED ORDER — ASPIRIN EC 81 MG PO TBEC: 81.0000 mg | DELAYED_RELEASE_TABLET | Freq: Every day | ORAL | Status: DC

## 2020-03-31 MED ORDER — MIRTAZAPINE 15 MG PO TBDP
15.0000 mg | ORAL_TABLET | Freq: Every day | ORAL | Status: DC
  Administered 2020-03-31: 22:00:00 15 mg via ORAL
  Filled 2020-03-31 (×2): qty 1

## 2020-03-31 MED ORDER — SODIUM CHLORIDE 0.9% FLUSH: 3.0000 mL | Freq: Two times a day (BID) | INTRAVENOUS | Status: DC

## 2020-03-31 MED ORDER — FUROSEMIDE 20 MG PO TABS: 20.0000 mg | ORAL_TABLET | Freq: Every day | ORAL | Status: DC

## 2020-03-31 MED ORDER — HEPARIN (PORCINE) 25000 UT/250ML-% IV SOLN
1500.0000 [IU]/h | INTRAVENOUS | Status: DC
  Administered 2020-03-31 – 2020-04-01 (×2): 1500 [IU]/h via INTRAVENOUS
  Filled 2020-03-31 (×2): qty 250

## 2020-03-31 MED ORDER — BUPROPION HCL 75 MG PO TABS
75.0000 mg | ORAL_TABLET | Freq: Every day | ORAL | Status: DC
  Filled 2020-03-31: qty 1

## 2020-03-31 MED ORDER — SODIUM CHLORIDE 0.9 % IV SOLN: INTRAVENOUS | Status: DC

## 2020-03-31 MED ORDER — DIVALPROEX SODIUM 500 MG PO DR TAB
500.0000 mg | DELAYED_RELEASE_TABLET | Freq: Two times a day (BID) | ORAL | Status: DC
  Administered 2020-03-31: 22:00:00 500 mg via ORAL
  Filled 2020-03-31 (×3): qty 1

## 2020-03-31 MED ORDER — AMLODIPINE BESYLATE 10 MG PO TABS
10.0000 mg | ORAL_TABLET | Freq: Every day | ORAL | Status: DC
  Filled 2020-03-31: qty 2

## 2020-03-31 MED ORDER — ACETAMINOPHEN 325 MG PO TABS: 650.0000 mg | ORAL_TABLET | ORAL | Status: DC | PRN

## 2020-03-31 NOTE — ED Notes (Addendum)
Verbal consent from pt (and daughter/LG [name] over the phone) obtained by this RN and Musician. Specials given report. Daughter states will be back in about 20 mins. [name]: [phone]. Pt currently A&Ox4. Pt signed consent as well. Denied any questions about procedure.

## 2020-03-31 NOTE — ED Notes (Addendum)
Greg RN to attempt IV placement with [place] soon. Daughter remains at bedside with pt. Pt/daughter deny any needs. Bed locked low. Rails up. Call bell within reach.

## 2020-03-31 NOTE — Consult Note (Addendum)
Christian [name] is a 69 y.o. male  [id number]  Primary Cardiologist: Adrian Blackwater Reason for Consultation: NSTEMI  HPI: Patient is a 69 year old male with a past medical history of HFpEF, hypertension, type 2 diabetes, sleep apnea, and bipolar disorder.  Patient presented to the emergency department with chest pain that began last night.  Patient described the pain initially as similar to heartburn and had minimal relief with Tums and Pepto-Bismol.  At which point patient's daughter convinced him to come to the emergency department and was found to have a troponin level of 755.  Patient also mildly hypoxic and placed on oxygen.  Patient recently hospitalized due to pneumonia and during that hospitalization an echo was completed with EF 50 to 55%, left ventricular global hypokinesis with grade 1 diastolic dysfunction, right ventricle moderately enlarged, moderate MR/AS. We have been consulted to help manage the patient's NSTEMI.   Review of Systems: Patient currently denies chest pain and states that his shortness of breath has improved with oxygen.   Past Medical History:  Diagnosis Date  . Bilateral lower extremity edema: chronic with venous stasis changes 06/02/2014  . Bipolar 1 disorder (HCC)   . Bipolar disorder (HCC)   . Chronic kidney disease   . Gout   . Hx of blood clots   . Hypertension   . Mood swings   . OSA on CPAP 06/02/2014  . Other and unspecified hyperlipidemia 06/02/2014  . Type II or unspecified type diabetes mellitus with unspecified complication, uncontrolled 06/02/2014    (Not in a hospital admission)    . acetaminophen  1,000 mg Oral BID  . amLODipine  10 mg Oral Daily  . ARIPiprazole  20 mg Oral Daily  . aspirin  324 mg Oral NOW   Or  . aspirin  300 mg Rectal NOW  . [START ON 04/01/2020] aspirin  81 mg Oral Pre-Cath  . [START ON 04/01/2020] aspirin EC  81 mg Oral Daily  . atorvastatin  10 mg Oral q1800  . [START ON 04/01/2020] buPROPion  75 mg Oral Daily  .  divalproex  500 mg Oral Q12H  . [START ON 04/01/2020] furosemide  20 mg Oral Daily  . insulin aspart  0-20 Units Subcutaneous TID WC  . insulin aspart  6 Units Subcutaneous TID WC  . [START ON 04/01/2020] liothyronine  25 mcg Oral QAC breakfast  . lisinopril  40 mg Oral Daily  . metoprolol succinate  50 mg Oral Daily  . mirtazapine  15 mg Oral QHS  . QUEtiapine  25 mg Oral BID  . QUEtiapine  300 mg Oral QHS  . sodium chloride flush  3 mL Intravenous Q12H  . sodium chloride flush  3 mL Intravenous Q12H    Infusions: . sodium chloride    . sodium chloride    . [START ON 04/01/2020] sodium chloride     Followed by  . [START ON 04/01/2020] sodium chloride    . heparin      Allergies  Allergen Reactions  . Zyprexa [Olanzapine] Other (See Comments)    Allergy noted by ACT Team. No reaction specified (??)    Social History   Socioeconomic History  . Marital status: Divorced    Spouse name: Not on file  . Number of children: Not on file  . Years of education: Not on file  . Highest education level: Not on file  Occupational History  . Not on file  Tobacco Use  . Smoking status: Never Smoker  .  Smokeless tobacco: Never Used  Substance and Sexual Activity  . Alcohol use: No  . Drug use: No  . Sexual activity: Not Currently  Other Topics Concern  . Not on file  Social History Narrative   *The patient is divorced and is currently on disability. He lives alone and has one daughter.             Social Determinants of Health   Financial Resource Strain:   . Difficulty of Paying Living Expenses:   Food Insecurity:   . Worried About Charity fundraiser in the Last Year:   . Arboriculturist in the Last Year:   Transportation Needs:   . Film/video editor (Medical):   Marland Kitchen Lack of Transportation (Non-Medical):   Physical Activity:   . Days of Exercise per Week:   . Minutes of Exercise per Session:   Stress:   . Feeling of Stress :   Social Connections:   . Frequency of  Communication with Friends and Family:   . Frequency of Social Gatherings with Friends and Family:   . Attends Religious Services:   . Active Member of Clubs or Organizations:   . Attends Archivist Meetings:   Marland Kitchen Marital Status:   Intimate Partner Violence:   . Fear of Current or Ex-Partner:   . Emotionally Abused:   Marland Kitchen Physically Abused:   . Sexually Abused:     Family History  Problem Relation Age of Onset  . Dementia Mother   . Arthritis Father   . Hypertension Father   . Mental illness Sister   . Diabetes Sister   . Heart failure Neg Hx   . Kidney failure Neg Hx   . Cancer Neg Hx     PHYSICAL EXAM: Vitals:   03/31/20 1045 03/31/20 1100  BP:    Pulse: 87 83  Resp: 14 (!) 21  Temp:    SpO2: 97% 97%    No intake or output data in the 24 hours ending 03/31/20 1208  General:  Well appearing. No respiratory difficulty HEENT: normal Neck: supple. no JVD. Carotids 2+ bilat; no bruits. No lymphadenopathy or thryomegaly appreciated. Cor: PMI nondisplaced. Regular rate & rhythm. No rubs, gallops or murmurs. Lungs: clear Abdomen: soft, nontender, nondistended. No hepatosplenomegaly. No bruits or masses. Good bowel sounds. Extremities: Nonpitting bilateral lower extremity edema Neuro: alert & oriented x 3, cranial nerves grossly intact. moves all 4 extremities w/o difficulty. Affect pleasant.  ECG: Normal sinus rhythm with nonspecific ST wave abnormalities. 89/bpm  Results for orders placed or performed during the hospital encounter of 03/31/20 (from the past 24 hour(s))  CBC with Differential/Platelet     Status: Abnormal   Collection Time: 03/31/20  9:38 AM  Result Value Ref Range   WBC 5.1 4.0 - 10.5 K/uL   RBC 4.68 4.22 - 5.81 MIL/uL   Hemoglobin 12.1 (L) 13.0 - 17.0 g/dL   HCT 38.5 (L) 39.0 - 52.0 %   MCV 82.3 80.0 - 100.0 fL   MCH 25.9 (L) 26.0 - 34.0 pg   MCHC 31.4 30.0 - 36.0 g/dL   RDW 14.6 11.5 - 15.5 %   Platelets 207 150 - 400 K/uL   nRBC 0.0  0.0 - 0.2 %   Neutrophils Relative % 62 %   Neutro Abs 3.2 1.7 - 7.7 K/uL   Lymphocytes Relative 26 %   Lymphs Abs 1.3 0.7 - 4.0 K/uL   Monocytes Relative 8 %   Monocytes  Absolute 0.4 0.1 - 1.0 K/uL   Eosinophils Relative 3 %   Eosinophils Absolute 0.1 0.0 - 0.5 K/uL   Basophils Relative 1 %   Basophils Absolute 0.0 0.0 - 0.1 K/uL   Immature Granulocytes 0 %   Abs Immature Granulocytes 0.02 0.00 - 0.07 K/uL  Comprehensive metabolic panel     Status: Abnormal   Collection Time: 03/31/20  9:38 AM  Result Value Ref Range   Sodium 137 135 - 145 mmol/L   Potassium 4.1 3.5 - 5.1 mmol/L   Chloride 100 98 - 111 mmol/L   CO2 31 22 - 32 mmol/L   Glucose, Bld 169 (H) 70 - 99 mg/dL   BUN 20 8 - 23 mg/dL   Creatinine, Ser 9.56 (H) 0.61 - 1.24 mg/dL   Calcium 21.3 (H) 8.9 - 10.3 mg/dL   Total Protein 6.6 6.5 - 8.1 g/dL   Albumin 2.8 (L) 3.5 - 5.0 g/dL   AST 14 (L) 15 - 41 U/L   ALT 11 0 - 44 U/L   Alkaline Phosphatase 59 38 - 126 U/L   Total Bilirubin 0.6 0.3 - 1.2 mg/dL   GFR calc non Af Amer 35 (L) >60 mL/min   GFR calc Af Amer 40 (L) >60 mL/min   Anion gap 6 5 - 15  Troponin I (High Sensitivity)     Status: Abnormal   Collection Time: 03/31/20  9:38 AM  Result Value Ref Range   Troponin I (High Sensitivity) 755 (HH) <18 ng/L  Brain natriuretic peptide     Status: Abnormal   Collection Time: 03/31/20  9:38 AM  Result Value Ref Range   B Natriuretic Peptide 138.0 (H) 0.0 - 100.0 pg/mL  Respiratory Panel by RT PCR (Flu A&B, Covid) - Nasopharyngeal Swab     Status: None   Collection Time: 03/31/20 10:06 AM   Specimen: Nasopharyngeal Swab  Result Value Ref Range   SARS Coronavirus 2 by RT PCR NEGATIVE NEGATIVE   Influenza A by PCR NEGATIVE NEGATIVE   Influenza B by PCR NEGATIVE NEGATIVE   DG Chest Portable 1 View  Result Date: 03/31/2020 CLINICAL DATA:  Shortness of breath, evaluate for edema EXAM: PORTABLE CHEST 1 VIEW COMPARISON:  03/17/2020 FINDINGS: The heart size and  mediastinal contours are within normal limits. Previously seen bilateral heterogeneous and interstitial airspace opacity is improved, nearly resolved. The visualized skeletal structures are unremarkable. IMPRESSION: Previously seen bilateral heterogeneous and interstitial airspace opacity is improved, nearly resolved, consistent with improved infection and/or edema. No new airspace opacity. Electronically Signed   By: Lauralyn Primes M.D.   On: 03/31/2020 09:32     ASSESSMENT AND PLAN: Patient presenting to the emergency department with chest pain and found to have NSTEMI with troponin of 755.  Patient also continues to be fluid overloaded with his history of HFpEF.  With the patient's cardiac history, plan to perform a cardiac catheterization tomorrow as patient took his xarelto this morning.  Patient's creatinine is currently 1.95, will give fluids overnight and start bicarb infusion tomorrow morning.  Patient's HFpEF will continue to be managed in the outpatient setting.  Cardiac catheterization procedure explained to the patient who is in understanding and agreement for the procedure. Blood pressure is currently stable but please resume his metoprolol and lisinopril when able.   Maryelizabeth Kaufmann NP-C

## 2020-03-31 NOTE — ED Notes (Signed)
Pt given menu and phone to order food from dietary

## 2020-03-31 NOTE — H&P (Signed)
History and Physical    Christian [name] KGY:[id number] DOB: 11-Dec-1950 DOA: 03/31/2020  PCP: [name], MD   Patient coming from: Home  I have personally briefly reviewed patient's old medical records in [facility]  Chief Complaint: Chest pain  HPI: Christian [name] is a 69 y.o. male with medical history significant for bipolar disorder, diabetes mellitus, chronic diastolic heart failure, hypertension, obstructive sleep apnea who presents to the emergency room for evaluation of chest pain.  Chest pain is mostly right-sided associated with shortness of breath which is worse on exertion.  Patient had a pulse oximetry of 87% on room air and was placed on 2 L of oxygen. Patient complains of a 10 pound weight gain over the past 2 weeks associated orthopnea and dyspnea. Patient was recently hospitalized for aspiration pneumonia and acute hypoxic respiratory failure. Chest x-ray shows previously seen bilateral heterogeneous and interstitial airspace opacity is improved, nearly resolved, consistent with improved infection and/or edema. No new airspace opacity. Twelve-lead EKG shows sinus rhythm with a left axis deviation  ED Course: Patient is a 69 year old male who presents to the emergency room for evaluation of chest pain and shortness of breath noted to be hypoxic on room air.  Patient has an elevated troponin of 755 without any significant EKG changes.  He has been started on heparin drip and received aspirin, he will benefit from admission  Review of Systems: As per HPI otherwise 10 point review of systems negative.    Past Medical History:  Diagnosis Date  . Bilateral lower extremity edema: chronic with venous stasis changes 06/02/2014  . Bipolar 1 disorder (HCC)   . Bipolar disorder (HCC)   . Chronic kidney disease   . Gout   . Hx of blood clots   . Hypertension   . Mood swings   . OSA on CPAP 06/02/2014  . Other and unspecified hyperlipidemia 06/02/2014  . Type II or unspecified  type diabetes mellitus with unspecified complication, uncontrolled 06/02/2014    History reviewed. No pertinent surgical history.   reports that he has never smoked. He has never used smokeless tobacco. He reports that he does not drink alcohol or use drugs.  Allergies  Allergen Reactions  . Zyprexa [Olanzapine] Other (See Comments)    Allergy noted by ACT Team. No reaction specified (??)    Family History  Problem Relation Age of Onset  . Dementia Mother   . Arthritis Father   . Hypertension Father   . Mental illness Sister   . Diabetes Sister   . Heart failure Neg Hx   . Kidney failure Neg Hx   . Cancer Neg Hx      Prior to Admission medications   Medication Sig Start Date End Date Taking? Authorizing Provider  ACETAMINOPHEN EXTRA STRENGTH 500 MG tablet Take 1,000 mg by mouth 2 (two) times daily. 02/14/20   [provider]  amLODipine (NORVASC) 10 MG tablet Take 1 tablet (10 mg total) by mouth daily. 02/06/20   Starkes-Perry, Juel Burrow, FNP  amoxicillin-clavulanate (AUGMENTIN) 875-125 MG tablet Take 1 tablet by mouth every 12 (twelve) hours. 03/21/20   Marinda Elk, MD  ARIPiprazole (ABILIFY) 20 MG tablet Take 1 tablet (20 mg total) by mouth daily. 02/06/20   Starkes-Perry, Juel Burrow, FNP  atorvastatin (LIPITOR) 10 MG tablet Take 1 tablet (10 mg total) by mouth daily at 6 PM. 02/05/20   Starkes-Perry, Juel Burrow, FNP  buPROPion (WELLBUTRIN) 75 MG tablet Take 1 tablet (75 mg  total) by mouth daily. 02/06/20   Starkes-Perry, Juel Burrow, FNP  divalproex (DEPAKOTE) 500 MG DR tablet Take 1 tablet (500 mg total) by mouth every 12 (twelve) hours. 02/05/20   Starkes-Perry, Juel Burrow, FNP  furosemide (LASIX) 20 MG tablet Take 1 tablet (20 mg total) by mouth daily. 10/24/19   Linwood Dibbles, MD  glimepiride (AMARYL) 2 MG tablet Take 2 mg by mouth daily with breakfast.    [provider]  liothyronine (CYTOMEL) 25 MCG tablet Take 25 mcg by mouth daily before breakfast.     [provider]  lisinopril (ZESTRIL) 40 MG tablet Take 1 tablet (40 mg total) by mouth daily. 02/06/20   Starkes-Perry, Juel Burrow, FNP  metoprolol succinate (TOPROL-XL) 50 MG 24 hr tablet Take 1 tablet (50 mg total) by mouth daily. Take with or immediately following a meal. 03/22/20   Marinda Elk, MD  mirtazapine (REMERON SOL-TAB) 15 MG disintegrating tablet Take 1 tablet (15 mg total) by mouth at bedtime. 02/05/20   Starkes-Perry, Juel Burrow, FNP  QUEtiapine (SEROQUEL) 25 MG tablet Take 25 mg by mouth 2 (two) times daily.     [provider]  QUEtiapine (SEROQUEL) 300 MG tablet Take 300 mg by mouth at bedtime.    [provider]  rivaroxaban (XARELTO) 20 MG TABS tablet Take 20 mg by mouth daily with supper.    [provider]  sitaGLIPtin (JANUVIA) 25 MG tablet Take 25 mg by mouth daily.    [provider]    Physical Exam: Vitals:   03/31/20 0841 03/31/20 1030 03/31/20 1045 03/31/20 1100  BP:  (!) 142/84    Pulse:  87 87 83  Resp:  15 14 (!) 21  Temp:      TempSrc:      SpO2:  96% 97% 97%  Weight: 129.3 kg     Height: 6' (1.829 m)        Vitals:   03/31/20 0841 03/31/20 1030 03/31/20 1045 03/31/20 1100  BP:  (!) 142/84    Pulse:  87 87 83  Resp:  15 14 (!) 21  Temp:      TempSrc:      SpO2:  96% 97% 97%  Weight: 129.3 kg     Height: 6' (1.829 m)       Constitutional: NAD, alert and oriented x 3.  Patient appears comfortable and in no distress Eyes: PERRL, lids and conjunctivae normal ENMT: Mucous membranes are moist.  Neck: normal, supple, no masses, no thyromegaly Respiratory: Air movement in both lung fields, no wheezing, no crackles. Normal respiratory effort. No accessory muscle use.  Cardiovascular: Regular rate and rhythm, no murmurs / rubs / gallops. 3+ extremity edema. 2+ pedal pulses. No carotid bruits.  Abdomen: no tenderness, no masses palpated. No hepatosplenomegaly. Bowel sounds positive.  Musculoskeletal: no clubbing /  cyanosis. No joint deformity upper and lower extremities.  Skin: no rashes, lesions, ulcers.  Neurologic: No gross focal neurologic deficit she arouses to verbal stimuli so why do you need a CT head Psychiatric: Normal mood and affect.   Labs on Admission: I have personally reviewed following labs and imaging studies  CBC: Recent Labs  Lab 03/31/20 0938  WBC 5.1  NEUTROABS 3.2  HGB 12.1*  HCT 38.5*  MCV 82.3  PLT 207   Basic Metabolic Panel: Recent Labs  Lab 03/31/20 0938  NA 137  K 4.1  CL 100  CO2 31  GLUCOSE 169*  BUN 20  CREATININE  1.95*  CALCIUM 10.7*   GFR: Estimated Creatinine Clearance: 51.8 mL/min (A) (by C-G formula based on SCr of 1.95 mg/dL (H)). Liver Function Tests: Recent Labs  Lab 03/31/20 0938  AST 14*  ALT 11  ALKPHOS 59  BILITOT 0.6  PROT 6.6  ALBUMIN 2.8*   No results for input(s): LIPASE, AMYLASE in the last 168 hours. No results for input(s): AMMONIA in the last 168 hours. Coagulation Profile: No results for input(s): INR, PROTIME in the last 168 hours. Cardiac Enzymes: No results for input(s): CKTOTAL, CKMB, CKMBINDEX, TROPONINI in the last 168 hours. BNP (last 3 results) No results for input(s): PROBNP in the last 8760 hours. HbA1C: No results for input(s): HGBA1C in the last 72 hours. CBG: No results for input(s): GLUCAP in the last 168 hours. Lipid Profile: No results for input(s): CHOL, HDL, LDLCALC, TRIG, CHOLHDL, LDLDIRECT in the last 72 hours. Thyroid Function Tests: No results for input(s): TSH, T4TOTAL, FREET4, T3FREE, THYROIDAB in the last 72 hours. Anemia Panel: No results for input(s): VITAMINB12, FOLATE, FERRITIN, TIBC, IRON, RETICCTPCT in the last 72 hours. Urine analysis:    Component Value Date/Time   COLORURINE YELLOW 11/01/2019 1300   APPEARANCEUR CLEAR 11/01/2019 1300   APPEARANCEUR Cloudy 08/01/2014 1614   LABSPEC 1.010 11/01/2019 1300   LABSPEC 1.008 08/01/2014 1614   PHURINE 5.0 11/01/2019 1300    GLUCOSEU 50 (A) 11/01/2019 1300   GLUCOSEU Negative 08/01/2014 1614   HGBUR SMALL (A) 11/01/2019 1300   BILIRUBINUR NEGATIVE 11/01/2019 1300   BILIRUBINUR Negative 08/01/2014 1614   KETONESUR NEGATIVE 11/01/2019 1300   PROTEINUR NEGATIVE 11/01/2019 1300   UROBILINOGEN 0.2 07/01/2014 1809   NITRITE NEGATIVE 11/01/2019 1300   LEUKOCYTESUR SMALL (A) 11/01/2019 1300   LEUKOCYTESUR Negative 08/01/2014 1614    Radiological Exams on Admission: DG Chest Portable 1 View  Result Date: 03/31/2020 CLINICAL DATA:  Shortness of breath, evaluate for edema EXAM: PORTABLE CHEST 1 VIEW COMPARISON:  03/17/2020 FINDINGS: The heart size and mediastinal contours are within normal limits. Previously seen bilateral heterogeneous and interstitial airspace opacity is improved, nearly resolved. The visualized skeletal structures are unremarkable. IMPRESSION: Previously seen bilateral heterogeneous and interstitial airspace opacity is improved, nearly resolved, consistent with improved infection and/or edema. No new airspace opacity. Electronically Signed   By: Lauralyn Primes M.D.   On: 03/31/2020 09:32    EKG: Independently reviewed.  Sinus rhythm  Assessment/Plan Principal Problem:   NSTEMI (non-ST elevated myocardial infarction) (HCC) Active Problems:   Hypertension   Type 2 diabetes mellitus with complication, without long-term current use of insulin (HCC)   OSA on CPAP   Bilateral lower extremity edema: chronic with venous stasis changes   Obesity, Class III, BMI 40-49.9 (morbid obesity) (HCC)    Non-ST elevation MI Patient presents for evaluation of chest pain mostly right-sided associated with shortness of breath Twelve-lead EKG shows no acute findings Troponin is elevated at 755 Continue heparin drip and aspirin Continue beta-blockers and statin Patient was supposed to have a cardiac cath done today but it has been moved to 04/02/20 since patient took his dose of Xarelto this morning.  Acute  hypoxic respiratory failure Etiology unclear Continue oxygen supplementation to maintain pulse oximetry greater than 92% We will attempt to wean patient off oxygen as tolerated Patient will need to be assessed for home oxygen need prior to discharge.  Hypertension Optimize blood pressure control Continue metoprolol and lisinopril   Diabetes mellitus with complications of chronic kidney disease Hold oral hypoglycemic agents Maintain  consistent carbohydrate diet Place on sliding scale insulin   Bipolar affective disorder Status post recent electroconvulsive therapy Continue Abilify, Remeron, Depakote and bupropion   Hypothyroidism Continue Synthroid   History of bilateral PE/paroxysmal A. Fib Xarelto is on hold since patient is on heparin drip     DVT prophylaxis: Heparin Code Status: Full Family Communication: Greater than 50% of time was spent discussing plan of care with patient at the bedside.  All questions and concerns have been addressed.  He verbalizes understanding and agrees with the plan. Disposition Plan: Back to previous home environment Consults called:  Cardiology    Louella Medaglia MD [facility]     03/31/2020, 11:43 AM

## 2020-03-31 NOTE — ED Notes (Signed)
Pt given meal tray from dietary at this time.  

## 2020-03-31 NOTE — ED Notes (Signed)
Sent rainbow with extra purple tube.

## 2020-03-31 NOTE — ED Notes (Signed)
Ready bed @ 1745, spoke with RN Lemmons.

## 2020-03-31 NOTE — ED Notes (Signed)
First Nurse Note: Pt to ED via POV for chest pain. Pt is in NAD. 

## 2020-03-31 NOTE — ED Notes (Signed)
Christian [name] back at bedside. Pt understands and is agreeable that all things metal need to be removed and he must change into hospital gown.

## 2020-03-31 NOTE — ED Notes (Signed)
Pt assisted to change clothes. Called pharm as lab states 2nd blue tube hemolyzed. Heparin gtt already running 5-17mins; pharm notified. Pt's daughter at bedside. Will pull 3rd blue tube and send to lab as requested by pharm. Heparin gtt paused to pull sample and drawn from new L arm IV.

## 2020-03-31 NOTE — Consult Note (Addendum)
ANTICOAGULATION CONSULT NOTE - Initial Consult  Pharmacy Consult for Heparin Indication: chest pain/ACS  Allergies  Allergen Reactions  . Zyprexa [Olanzapine] Other (See Comments)    Allergy noted by ACT Team. No reaction specified (??)    Patient Measurements: Height: 6' (182.9 cm) Weight: 129.3 kg (285 lb) IBW/kg (Calculated) : 77.6 Heparin Dosing Weight: 106.7 kg  Vital Signs: Temp: 98.7 F (37.1 C) (05/03 0839) Temp Source: Oral (05/03 0839) BP: 120/70 (05/03 0839) Pulse Rate: 58 (05/03 0839)  Labs: Recent Labs    03/31/20 0938  HGB 12.1*  HCT 38.5*  PLT 207  CREATININE 1.95*  TROPONINIHS 755*    Estimated Creatinine Clearance: 51.8 mL/min (A) (by C-G formula based on SCr of 1.95 mg/dL (H)).   Medical History: Past Medical History:  Diagnosis Date  . Bilateral lower extremity edema: chronic with venous stasis changes 06/02/2014  . Bipolar 1 disorder (HCC)   . Bipolar disorder (HCC)   . Chronic kidney disease   . Gout   . Hx of blood clots   . Hypertension   . Mood swings   . OSA on CPAP 06/02/2014  . Other and unspecified hyperlipidemia 06/02/2014  . Type II or unspecified type diabetes mellitus with unspecified complication, uncontrolled 06/02/2014    Assessment: 69 year old male admitted with chest pain.  He is new onset heart failure, as well as history of paroxysmal atrial fibrillation and bilateral PE.  He is currently taking rivaroxaban as an outpatient.  Last dose is currently unknown.  Hemoglobin and platelets appear stable.   Goal of Therapy:  aPTT 66-102 seconds Monitor platelets by anticoagulation protocol: Yes   Plan:  No bolus as last dose of xarelto per RN was this AM at 0800.  Will start infusion rate at 1500 units/hr once IV access is obtained.  Baseline labs ordered.  Will follow 6-hour aPTT for now.  CBC with AM labs.  Cherly Hensen, PharmD 03/31/2020,10:45 AM

## 2020-03-31 NOTE — ED Notes (Signed)
Assessed for placement of IV. Pt's arms very swollen. Hard stick.

## 2020-03-31 NOTE — ED Notes (Signed)
IV team attempting for 2nd IV access point now.

## 2020-03-31 NOTE — ED Triage Notes (Signed)
Pt here with c/o right sided cp that began yesterday evening, increasing shob over the weekend, worsens with exertion. 87% on RA. 92% on 2L added per [place] in triage. Was recently dc'd from this hospital with new CHF diagnosis, was to be seen in CHF clinic in am. Appears in NAD.

## 2020-03-31 NOTE — ED Provider Notes (Signed)
[facility] Emergency Department Provider Note    First MD Initiated Contact with Patient 03/31/20 0848     (approximate)  I have reviewed the triage vital signs and the nursing notes.   HISTORY  Chief Complaint Chest Pain    HPI Christian [name] is a 69 y.o. male close past medical history with recent admission to hospital for new onset heart failure status post recent ECT treatment presents to the ER for worsening shortness of breath generalized malaise increased lower extremity swelling.  Was supposed to have increased his Lasix as an outpatient but there is some confusion or discrepancy with the pharmacy so he has not increased it.  Has reportedly had 10 kg weight gain over the past 2weeks.  Does endorse significant orthopnea as well as dyspnea.    Past Medical History:  Diagnosis Date  . Bilateral lower extremity edema: chronic with venous stasis changes 06/02/2014  . Bipolar 1 disorder (HCC)   . Bipolar disorder (HCC)   . Chronic kidney disease   . Gout   . Hx of blood clots   . Hypertension   . Mood swings   . OSA on CPAP 06/02/2014  . Other and unspecified hyperlipidemia 06/02/2014  . Type II or unspecified type diabetes mellitus with unspecified complication, uncontrolled 06/02/2014   Family History  Problem Relation Age of Onset  . Dementia Mother   . Arthritis Father   . Hypertension Father   . Mental illness Sister   . Diabetes Sister   . Heart failure Neg Hx   . Kidney failure Neg Hx   . Cancer Neg Hx    History reviewed. No pertinent surgical history. Patient Active Problem List   Diagnosis Date Noted  . Aspiration pneumonia of both lower lobes due to gastric secretions (HCC) 03/21/2020  . Sacral decubitus ulcer, stage II (HCC) 03/19/2020  . Chronic diastolic CHF (congestive heart failure) (HCC) 03/17/2020  . Encephalopathy   . Paroxysmal atrial fibrillation (HCC) 06/18/2018  . History of pulmonary embolism 06/17/2018  .  Generalized weakness 03/27/2018  . Diabetes mellitus type 2 in nonobese (HCC) 03/27/2018  . Pulmonary embolism (HCC) 03/27/2018  . Fall   . Laceration of eyebrow   . Bipolar I disorder, most recent episode depressed, severe w psychosis (HCC) 12/22/2015  . Bipolar affective disorder, depressed, severe, with psychotic behavior (HCC)   . Bipolar I disorder, current or most recent episode depressed, with psychotic features (HCC)   . Bipolar I disorder, most recent episode depressed (HCC)   . Severe bipolar I disorder with depression (HCC)   . Bipolar I disorder, most recent episode depressed, severe with psychotic features (HCC)   . Overdose 09/11/2015  . Acute respiratory failure with hypoxia (HCC) 09/05/2015  . Elevated troponin 09/05/2015  . Somnolence 09/05/2015  . Bipolar depression (HCC) 09/05/2015  . Acute bilateral deep vein thrombosis (DVT) of femoral veins (HCC) 09/05/2015  . Bilateral pulmonary embolism (HCC) 09/02/2015  . Labile hypertension   . Pulmonary embolism with acute cor pulmonale (HCC)   . Dyspnea   . Orthostatic hypotension   . Bipolar disorder, in partial remission, most recent episode depressed (HCC)   . Syncope, near 08/31/2015  . Affective psychosis, bipolar (HCC)   . Bipolar affective disorder, depressed, mild (HCC) 08/28/2015  . Pressure ulcer 07/30/2015  . Protein-calorie malnutrition, severe (HCC) 07/30/2015  . Dehydration 07/29/2015  . Diabetes (HCC) 07/09/2015  . UTI (urinary tract infection) 06/19/2014  . Positive blood culture 06/19/2014  .  Type 2 diabetes mellitus with complication, without long-term current use of insulin (HCC) 06/02/2014  . Other and unspecified hyperlipidemia 06/02/2014  . OSA on CPAP 06/02/2014  . Bilateral lower extremity edema: chronic with venous stasis changes 06/02/2014  . Gout 06/02/2014  . Hypertension       Prior to Admission medications   Medication Sig Start Date End Date Taking? Authorizing Provider   ACETAMINOPHEN EXTRA STRENGTH 500 MG tablet Take 1,000 mg by mouth 2 (two) times daily. 02/14/20   [provider]  amLODipine (NORVASC) 10 MG tablet Take 1 tablet (10 mg total) by mouth daily. 02/06/20   Starkes-Perry, Juel Burrow, FNP  amoxicillin-clavulanate (AUGMENTIN) 875-125 MG tablet Take 1 tablet by mouth every 12 (twelve) hours. 03/21/20   Marinda Elk, MD  ARIPiprazole (ABILIFY) 20 MG tablet Take 1 tablet (20 mg total) by mouth daily. 02/06/20   Starkes-Perry, Juel Burrow, FNP  atorvastatin (LIPITOR) 10 MG tablet Take 1 tablet (10 mg total) by mouth daily at 6 PM. 02/05/20   Starkes-Perry, Juel Burrow, FNP  buPROPion (WELLBUTRIN) 75 MG tablet Take 1 tablet (75 mg total) by mouth daily. 02/06/20   Starkes-Perry, Juel Burrow, FNP  divalproex (DEPAKOTE) 500 MG DR tablet Take 1 tablet (500 mg total) by mouth every 12 (twelve) hours. 02/05/20   Starkes-Perry, Juel Burrow, FNP  furosemide (LASIX) 20 MG tablet Take 1 tablet (20 mg total) by mouth daily. 10/24/19   Linwood Dibbles, MD  glimepiride (AMARYL) 2 MG tablet Take 2 mg by mouth daily with breakfast.    [provider]  liothyronine (CYTOMEL) 25 MCG tablet Take 25 mcg by mouth daily before breakfast.     [provider]  lisinopril (ZESTRIL) 40 MG tablet Take 1 tablet (40 mg total) by mouth daily. 02/06/20   Starkes-Perry, Juel Burrow, FNP  metoprolol succinate (TOPROL-XL) 50 MG 24 hr tablet Take 1 tablet (50 mg total) by mouth daily. Take with or immediately following a meal. 03/22/20   Marinda Elk, MD  mirtazapine (REMERON SOL-TAB) 15 MG disintegrating tablet Take 1 tablet (15 mg total) by mouth at bedtime. 02/05/20   Starkes-Perry, Juel Burrow, FNP  QUEtiapine (SEROQUEL) 25 MG tablet Take 25 mg by mouth 2 (two) times daily.     [provider]  QUEtiapine (SEROQUEL) 300 MG tablet Take 300 mg by mouth at bedtime.    [provider]  rivaroxaban (XARELTO) 20 MG TABS tablet Take 20 mg by mouth daily with supper.    [provider]  sitaGLIPtin (JANUVIA) 25 MG tablet Take 25 mg by mouth daily.    [provider]    Allergies Zyprexa [olanzapine]    Social History Social History   Tobacco Use  . Smoking status: Never Smoker  . Smokeless tobacco: Never Used  Substance Use Topics  . Alcohol use: No  . Drug use: No    Review of Systems Patient denies headaches, rhinorrhea, blurry vision, numbness, shortness of breath, chest pain, edema, cough, abdominal pain, nausea, vomiting, diarrhea, dysuria, fevers, rashes or hallucinations unless otherwise stated above in HPI. ____________________________________________   PHYSICAL EXAM:  VITAL SIGNS: Vitals:   03/31/20 0839  BP: 120/70  Pulse: (!) 58  Resp: 18  Temp: 98.7 F (37.1 C)  SpO2: 93%    Constitutional: Alert and oriented.  Eyes: Conjunctivae are normal.  Head: Atraumatic. Nose: No congestion/rhinnorhea. Mouth/Throat: Mucous membranes are moist.   Neck: No stridor. Painless ROM.  Cardiovascular: Normal rate, regular rhythm. Grossly normal  heart sounds.  Good peripheral circulation. Respiratory: Normal respiratory effort.  No retractions. Diminished posterior lung fields Gastrointestinal: Soft and nontender. No distention. No abdominal bruits. No CVA tenderness. Genitourinary: deferred Musculoskeletal: No lower extremity tenderness, 2+ edema.  No joint effusions. Neurologic:  Normal speech and language. No gross focal neurologic deficits are appreciated. No facial droop Skin:  Skin is warm, dry and intact. No rash noted. Psychiatric: Mood and affect are normal. Speech and behavior are normal.  ____________________________________________   LABS (all labs ordered are listed, but only abnormal results are displayed)  Results for orders placed or performed during the hospital encounter of 03/31/20 (from the past 24 hour(s))  CBC with Differential/Platelet     Status: Abnormal   Collection Time: 03/31/20  9:38 AM   Result Value Ref Range   WBC 5.1 4.0 - 10.5 K/uL   RBC 4.68 4.22 - 5.81 MIL/uL   Hemoglobin 12.1 (L) 13.0 - 17.0 g/dL   HCT 99.3 (L) 57.0 - 17.7 %   MCV 82.3 80.0 - 100.0 fL   MCH 25.9 (L) 26.0 - 34.0 pg   MCHC 31.4 30.0 - 36.0 g/dL   RDW 93.9 03.0 - 09.2 %   Platelets 207 150 - 400 K/uL   nRBC 0.0 0.0 - 0.2 %   Neutrophils Relative % 62 %   Neutro Abs 3.2 1.7 - 7.7 K/uL   Lymphocytes Relative 26 %   Lymphs Abs 1.3 0.7 - 4.0 K/uL   Monocytes Relative 8 %   Monocytes Absolute 0.4 0.1 - 1.0 K/uL   Eosinophils Relative 3 %   Eosinophils Absolute 0.1 0.0 - 0.5 K/uL   Basophils Relative 1 %   Basophils Absolute 0.0 0.0 - 0.1 K/uL   Immature Granulocytes 0 %   Abs Immature Granulocytes 0.02 0.00 - 0.07 K/uL  Comprehensive metabolic panel     Status: Abnormal   Collection Time: 03/31/20  9:38 AM  Result Value Ref Range   Sodium 137 135 - 145 mmol/L   Potassium 4.1 3.5 - 5.1 mmol/L   Chloride 100 98 - 111 mmol/L   CO2 31 22 - 32 mmol/L   Glucose, Bld 169 (H) 70 - 99 mg/dL   BUN 20 8 - 23 mg/dL   Creatinine, Ser 3.30 (H) 0.61 - 1.24 mg/dL   Calcium 07.6 (H) 8.9 - 10.3 mg/dL   Total Protein 6.6 6.5 - 8.1 g/dL   Albumin 2.8 (L) 3.5 - 5.0 g/dL   AST 14 (L) 15 - 41 U/L   ALT 11 0 - 44 U/L   Alkaline Phosphatase 59 38 - 126 U/L   Total Bilirubin 0.6 0.3 - 1.2 mg/dL   GFR calc non Af Amer 35 (L) >60 mL/min   GFR calc Af Amer 40 (L) >60 mL/min   Anion gap 6 5 - 15  Troponin I (High Sensitivity)     Status: Abnormal   Collection Time: 03/31/20  9:38 AM  Result Value Ref Range   Troponin I (High Sensitivity) 755 (HH) <18 ng/L  Brain natriuretic peptide     Status: Abnormal   Collection Time: 03/31/20  9:38 AM  Result Value Ref Range   B Natriuretic Peptide 138.0 (H) 0.0 - 100.0 pg/mL   ____________________________________________  EKG My review and personal interpretation at Time: 8:33   Indication: sob  Rate: 120  Rhythm: sinus Axis: left Other: nonspecific st abn, appears  consistent with previous ekg ____________________________________________  RADIOLOGY  I personally reviewed all radiographic images  ordered to evaluate for the above acute complaints and reviewed radiology reports and findings.  These findings were personally discussed with the patient.  Please see medical record for radiology report.  ____________________________________________   PROCEDURES  Procedure(s) performed:  .Critical Care Performed by: Merlyn Lot, MD Authorized by: Merlyn Lot, MD   Critical care provider statement:    Critical care time (minutes):  40   Critical care time was exclusive of:  Separately billable procedures and treating other patients   Critical care was necessary to treat or prevent imminent or life-threatening deterioration of the following conditions:  Respiratory failure   Critical care was time spent personally by me on the following activities:  Development of treatment plan with patient or surrogate, discussions with consultants, evaluation of patient's response to treatment, examination of patient, obtaining history from patient or surrogate, ordering and performing treatments and interventions, ordering and review of laboratory studies, ordering and review of radiographic studies, pulse oximetry, re-evaluation of patient's condition and review of old charts .1-3 Lead EKG Interpretation Performed by: Merlyn Lot, MD Authorized by: Merlyn Lot, MD     Interpretation: normal     ECG rate:  90   ECG rate assessment: normal     Rhythm: sinus rhythm     Ectopy: none     Conduction: normal        Critical Care performed: yes ____________________________________________   INITIAL IMPRESSION / ASSESSMENT AND PLAN / ED COURSE  Pertinent labs & imaging results that were available during my care of the patient were reviewed by me and considered in my medical decision making (see chart for details).   DDX: ACS, pericarditis,  chf, pe, dissection, pna, bronchitis, costochondritis   Christian [name] is a 69 y.o. who presents to the ED with symptoms as described above.  Patient found to be hypoxic on room air.  He is also having right-sided chest pain describes a pressure.  Pain started this AM.  The patient will be placed on continuous pulse oximetry and telemetry for monitoring.  Laboratory evaluation will be sent to evaluate for the above complaints.     Clinical Course as of Mar 31 1049  Mon Mar 31, 2020  1044 Patient's troponin did come back at 755.  Repeat EKG without any significant delta changes.  Still having some mild discomfort in the right shoulder.   [PR]  1047 Case discussed with Dr. Fletcher Anon who agrees does not meet criteria for STEMI will be appropriate for admission for medical work-up.  I have consulted Dr. Chancy Milroy his primary cardiologist.   [PR]    Clinical Course User Index [PR] Merlyn Lot, MD    The patient was evaluated in Emergency Department today for the symptoms described in the history of present illness. He/she was evaluated in the context of the global COVID-19 pandemic, which necessitated consideration that the patient might be at risk for infection with the SARS-CoV-2 virus that causes COVID-19. Institutional protocols and algorithms that pertain to the evaluation of patients at risk for COVID-19 are in a state of rapid change based on information released by regulatory bodies including the CDC and federal and state organizations. These policies and algorithms were followed during the patient's care in the ED.  As part of my medical decision making, I reviewed the following data within the [place] notes reviewed and incorporated, Labs reviewed, notes from prior ED visits and [place] Controlled Substance Database   ____________________________________________   FINAL CLINICAL IMPRESSION(S) /  ED DIAGNOSES  Final diagnoses:  Chest pain, unspecified type  Acute  respiratory failure with hypoxia (HCC)      NEW MEDICATIONS STARTED DURING THIS VISIT:  New Prescriptions   No medications on file     Note:  This document was prepared using Dragon voice recognition software and may include unintentional dictation errors.    Willy Eddy, MD 03/31/20 1050

## 2020-03-31 NOTE — Consult Note (Signed)
ANTICOAGULATION CONSULT NOTE - Follow up Consult  Pharmacy Consult for Heparin Indication: chest pain/ACS  Allergies  Allergen Reactions  . Zyprexa [Olanzapine] Other (See Comments)    Allergy noted by ACT Team. No reaction specified (??)    Patient Measurements: Height: 6' (182.9 cm) Weight: 130.4 kg (287 lb 6.4 oz) IBW/kg (Calculated) : 77.6 Heparin Dosing Weight: 106.7 kg  Vital Signs: Temp: 97.8 F (36.6 C) (05/03 1920) Temp Source: Oral (05/03 1920) BP: 135/79 (05/03 1920) Pulse Rate: 86 (05/03 1920)  Labs: Recent Labs    03/31/20 0938 03/31/20 1220 03/31/20 1233 03/31/20 1910  HGB 12.1*  --  11.5*  --   HCT 38.5*  --  36.0*  --   PLT 207  --  187  --   APTT  --   --   --  70*  LABPROT  --   --   --  16.4*  INR  --   --   --  1.4*  CREATININE 1.95*  --  1.55*  --   TROPONINIHS 755* 852*  --   --     Estimated Creatinine Clearance: 65.4 mL/min (A) (by C-G formula based on SCr of 1.55 mg/dL (H)).   Assessment: 69 year old male admitted with chest pain ordered to start heparin drip.  Pt with history of paroxysmal atrial fibrillation and bilateral PE on Xarelto PTA, last dose at 0800 this AM.    Heparin infusion started at 1500 units/hr (no bolus) Of note, tried multiple times to get baseline labs with sample hemolyzed x2 and RN sending off 3rd tube.   Goal of Therapy:  aPTT 66-102 seconds Monitor platelets by anticoagulation protocol: Yes   Plan:  aPTT therapeutic x1. Will continue heparin drip at current rate and recheck aPTT in 6h to confirm. HL in AM to follow for correlation with aPTT.  CBC with AM labs.  Pharmacy will continue to follow.  Marty Heck, PharmD, BCPS 03/31/2020,7:47 PM

## 2020-04-01 ENCOUNTER — Ambulatory Visit: Payer: Medicare PPO | Admitting: Family

## 2020-04-01 ENCOUNTER — Encounter: Payer: Self-pay | Admitting: Cardiology

## 2020-04-01 ENCOUNTER — Encounter
Admission: EM | Disposition: A | Discharge status: Home / Self Care | Payer: Self-pay | Source: Home / Self Care | Attending: Internal Medicine

## 2020-04-01 DIAGNOSIS — N1831 Chronic kidney disease, stage 3a: Secondary | ICD-10-CM

## 2020-04-01 DIAGNOSIS — E1121 Type 2 diabetes mellitus with diabetic nephropathy: Secondary | ICD-10-CM

## 2020-04-01 DIAGNOSIS — E1122 Type 2 diabetes mellitus with diabetic chronic kidney disease: Secondary | ICD-10-CM | POA: Diagnosis present

## 2020-04-01 DIAGNOSIS — I2510 Atherosclerotic heart disease of native coronary artery without angina pectoris: Secondary | ICD-10-CM | POA: Diagnosis present

## 2020-04-01 DIAGNOSIS — E118 Type 2 diabetes mellitus with unspecified complications: Secondary | ICD-10-CM

## 2020-04-01 DIAGNOSIS — I214 Non-ST elevation (NSTEMI) myocardial infarction: Principal | ICD-10-CM

## 2020-04-01 DIAGNOSIS — I249 Acute ischemic heart disease, unspecified: Secondary | ICD-10-CM

## 2020-04-01 DIAGNOSIS — J9601 Acute respiratory failure with hypoxia: Secondary | ICD-10-CM

## 2020-04-01 HISTORY — PX: LEFT HEART CATH AND CORONARY ANGIOGRAPHY: CATH118249

## 2020-04-01 LAB — CBC
HCT: 35.3 % — ABNORMAL LOW (ref 39.0–52.0)
Hemoglobin: 11.1 g/dL — ABNORMAL LOW (ref 13.0–17.0)
MCH: 25.6 pg — ABNORMAL LOW (ref 26.0–34.0)
MCHC: 31.4 g/dL (ref 30.0–36.0)
MCV: 81.5 fL (ref 80.0–100.0)
Platelets: 249 10*3/uL (ref 150–400)
RBC: 4.33 MIL/uL (ref 4.22–5.81)
RDW: 14.4 % (ref 11.5–15.5)
WBC: 4.7 10*3/uL (ref 4.0–10.5)
nRBC: 0 % (ref 0.0–0.2)

## 2020-04-01 LAB — GLUCOSE, CAPILLARY
Glucose-Capillary: 67 mg/dL — ABNORMAL LOW (ref 70–99)
Glucose-Capillary: 109 mg/dL — ABNORMAL HIGH (ref 70–99)
Glucose-Capillary: 104 mg/dL — ABNORMAL HIGH (ref 70–99)
Glucose-Capillary: 84 mg/dL (ref 70–99)
Glucose-Capillary: 156 mg/dL — ABNORMAL HIGH (ref 70–99)

## 2020-04-01 LAB — BASIC METABOLIC PANEL
Anion gap: 6 (ref 5–15)
BUN: 22 mg/dL (ref 8–23)
CO2: 28 mmol/L (ref 22–32)
Calcium: 9.2 mg/dL (ref 8.9–10.3)
Chloride: 103 mmol/L (ref 98–111)
Creatinine, Ser: 1.58 mg/dL — ABNORMAL HIGH (ref 0.61–1.24)
GFR calc Af Amer: 52 mL/min — ABNORMAL LOW (ref >60)
GFR calc non Af Amer: 45 mL/min — ABNORMAL LOW (ref >60)
Glucose, Bld: 116 mg/dL — ABNORMAL HIGH (ref 70–99)
Potassium: 3.6 mmol/L (ref 3.5–5.1)
Sodium: 137 mmol/L (ref 135–145)

## 2020-04-01 LAB — HEPARIN LEVEL (UNFRACTIONATED): Heparin Unfractionated: 2.16 IU/mL — ABNORMAL HIGH (ref 0.30–0.70)

## 2020-04-01 LAB — APTT: aPTT: 81 seconds — ABNORMAL HIGH (ref 24–36)

## 2020-04-01 SURGERY — LEFT HEART CATH AND CORONARY ANGIOGRAPHY
Anesthesia: Moderate Sedation | Laterality: Left

## 2020-04-01 MED ORDER — DEXTROSE 50 % IV SOLN
INTRAVENOUS | Status: AC
  Administered 2020-04-01: 08:00:00 50 mL via INTRAVENOUS
  Filled 2020-04-01: qty 50

## 2020-04-01 MED ORDER — SODIUM CHLORIDE 0.9 % IV SOLN: 250.0000 mL | INTRAVENOUS | Status: DC | PRN

## 2020-04-01 MED ORDER — HEPARIN (PORCINE) IN NACL 1000-0.9 UT/500ML-% IV SOLN
INTRAVENOUS | Status: AC
  Filled 2020-04-01: qty 1000

## 2020-04-01 MED ORDER — ONDANSETRON HCL 4 MG/2ML IJ SOLN: 4.0000 mg | Freq: Four times a day (QID) | INTRAMUSCULAR | Status: DC | PRN

## 2020-04-01 MED ORDER — DEXTROSE 50 % IV SOLN
12.5000 g | INTRAVENOUS | Status: AC
  Administered 2020-04-01: 08:00:00 12.5 g via INTRAVENOUS

## 2020-04-01 MED ORDER — HEPARIN (PORCINE) IN NACL 1000-0.9 UT/500ML-% IV SOLN
INTRAVENOUS | Status: DC | PRN
  Administered 2020-04-01: 500 mL

## 2020-04-01 MED ORDER — LABETALOL HCL 5 MG/ML IV SOLN: 10.0000 mg | INTRAVENOUS | Status: AC | PRN

## 2020-04-01 MED ORDER — IOHEXOL 300 MG/ML  SOLN
INTRAMUSCULAR | Status: DC | PRN
  Administered 2020-04-01: 09:00:00 65 mL

## 2020-04-01 MED ORDER — RIVAROXABAN 20 MG PO TABS: 20.0000 mg | ORAL_TABLET | Freq: Every day | ORAL | 0 refills | Status: AC

## 2020-04-01 MED ORDER — FENTANYL CITRATE (PF) 100 MCG/2ML IJ SOLN
INTRAMUSCULAR | Status: DC | PRN
  Administered 2020-04-01: 50 ug via INTRAVENOUS

## 2020-04-01 MED ORDER — FENTANYL CITRATE (PF) 100 MCG/2ML IJ SOLN
INTRAMUSCULAR | Status: AC
  Filled 2020-04-01: qty 2

## 2020-04-01 MED ORDER — SODIUM CHLORIDE 0.9% FLUSH: 3.0000 mL | INTRAVENOUS | Status: DC | PRN

## 2020-04-01 MED ORDER — ACETAMINOPHEN 325 MG PO TABS: 650.0000 mg | ORAL_TABLET | ORAL | Status: DC | PRN

## 2020-04-01 MED ORDER — SODIUM CHLORIDE 0.9% FLUSH: 3.0000 mL | Freq: Two times a day (BID) | INTRAVENOUS | Status: DC

## 2020-04-01 MED ORDER — SODIUM CHLORIDE 0.9 % IV SOLN: 130.0000 mL/h | INTRAVENOUS | Status: DC

## 2020-04-01 MED ORDER — MIDAZOLAM HCL 2 MG/2ML IJ SOLN
INTRAMUSCULAR | Status: DC | PRN
  Administered 2020-04-01: 1 mg via INTRAVENOUS

## 2020-04-01 MED ORDER — ASPIRIN 81 MG PO TBEC: 81.0000 mg | DELAYED_RELEASE_TABLET | Freq: Every day | ORAL | 0 refills | Status: DC

## 2020-04-01 MED ORDER — HYDRALAZINE HCL 20 MG/ML IJ SOLN: 10.0000 mg | INTRAMUSCULAR | Status: AC | PRN

## 2020-04-01 MED ORDER — DEXTROSE 50 % IV SOLN: 50.0000 mL | Freq: Once | INTRAVENOUS | Status: AC

## 2020-04-01 MED ORDER — MIDAZOLAM HCL 2 MG/2ML IJ SOLN
INTRAMUSCULAR | Status: AC
  Filled 2020-04-01: qty 2

## 2020-04-01 SURGICAL SUPPLY — 13 items
CATH INFINITI 5FR ANG PIGTAIL (CATHETERS) ×2 IMPLANT
CATH INFINITI 5FR JL4 (CATHETERS) ×2 IMPLANT
CATH INFINITI JR4 5F (CATHETERS) ×2 IMPLANT
DEVICE CLOSURE MYNXGRIP 5F (Vascular Products) ×2 IMPLANT
GLIDESHEATH SLEND SS 6F .021 (SHEATH) IMPLANT
GUIDEWIRE INQWIRE 1.5J.035X260 (WIRE) IMPLANT
INQWIRE 1.5J .035X260CM (WIRE)
KIT MANI 3VAL PERCEP (MISCELLANEOUS) ×3 IMPLANT
NDL PERC 18GX7CM (NEEDLE) IMPLANT
NEEDLE PERC 18GX7CM (NEEDLE) ×3 IMPLANT
PACK CARDIAC CATH (CUSTOM PROCEDURE TRAY) ×3 IMPLANT
SHEATH AVANTI 5FR X 11CM (SHEATH) ×2 IMPLANT
WIRE GUIDERIGHT .035X150 (WIRE) ×2 IMPLANT

## 2020-04-01 NOTE — Progress Notes (Signed)
ReDS Pro not appropriate for this pt due to BMI < 38. 

## 2020-04-01 NOTE — Discharge Instructions (Signed)
Coronary Angioplasty, Care After This sheet gives you information about how to care for yourself after your procedure. Your health care provider may also give you more specific instructions. If you have problems or questions, contact your health care provider. What can I expect after the procedure? After your procedure, it is common to have:  Bruising at the catheter insertion site. This usually fades within 1-2 weeks.  Blood collecting in the tissue (hematoma) that may be painful to the touch. It should become smaller and less tender within 1-2 weeks. Follow these instructions at home: Medicines  Take over-the-counter and prescription medicines only as told by your health care provider.  Blood thinners may be prescribed after your procedure to improve blood flow. Bathing  You may shower 24-48 hours after the procedure or as told by your health care provider.  Do not take baths, swim, or use a hot tub until your health care provider approves. Insertion site care   Follow instructions from your health care provider about how to take care of your insertion site. Make sure you: ? Wash your hands with soap and water before you change your bandage (dressing). If soap and water are not available, use hand sanitizer. ? Change your dressing as told by your health care provider. ? Gently wash the site with plain soap and water. ? Use a clean towel to pat the area dry. ? Do not rub the site, because this may cause bleeding. ? Do not apply powder or lotion to the site.  Check your insertion site every day for signs of infection. Check for: ? More redness, swelling, or pain. ? More fluid or blood. ? Warmth. ? Pus or a bad smell. Lifestyle   Make any lifestyle changes as recommended by your health care provider. This may include: ? Not using any products that contain nicotine or tobacco, such as cigarettes and e-cigarettes. If you need help quitting, ask your health care  provider. ? Managing your weight. ? Getting regular exercise. ? Managing your blood pressure. ? Limiting your alcohol intake. ? Managing other health problems, such as diabetes.  Eat a heart-healthy diet. This should include plenty of fresh fruits and vegetables. Avoid foods that are: ? High in salt (sodium). ? Canned or highly processed. ? High in saturated fat or sugar. ? Fried. General instructions  Do not lift over 10 lb (4.5 kg) for 5 days after your procedure or as told by your health care provider.  Ask your health care provider when it is okay to: ? Return to work or school. ? Resume usual physical activities or sports. ? Resume sexual activity.  Keep all follow-up visits as told by your health care provider. This is important. Contact a health care provider if:  You have a fever.  You have chills.  You have increased bleeding from the insertion site. Hold pressure on the site. Get help right away if:  You develop chest pain or shortness of breath, feel faint, or pass out.  You have unusual pain at the insertion site.  You have redness, warmth, or swelling at the insertion site.  You have drainage (other than a small amount of blood on the dressing) from the insertion site.  The insertion site is bleeding, and the bleeding does not stop after 30 minutes of holding steady pressure on the site.  You develop bleeding from any other place, such as from the rectum. There may be bright red blood in your urine or stool, or it   may appear as black, tarry stool. This information is not intended to replace advice given to you by your health care provider. Make sure you discuss any questions you have with your health care provider. Document Revised: 10/28/2017 Document Reviewed: 06/20/2016 Elsevier Patient Education  2020 [employer]  

## 2020-04-01 NOTE — Discharge Summary (Addendum)
Physician Discharge Summary  Christian [name] ONG:[id number] DOB: [birth year] DOA: 03/31/2020  PCP: [name], MD  Admit date: 03/31/2020 Discharge date: 04/01/2020  Admitted From: Home Disposition: Home   Recommendations for Outpatient Follow-up:  1. Follow up with cardiologist (Dr. Welton Flakes) on 5/7  Home Health: None Equipment/Devices: None  Discharge Condition: Fair CODE STATUS: Full code Diet recommendation: Heart Healthy / Carb Modified    Discharge Diagnoses:  69 year old obese male with bipolar disorder, diabetes mellitus type 2, chronic diastolic CHF, hypertension, OSA presented with chest pain with findings of NSTEMI.  2D echo with EF of 50%.  CT angiogram of the chest negative for PE. Cardiac cath done today showing high-grade mid LAD severely calcified lesion, occluded mid RCA lesion (chronic) with collaterals.  EF 50%.   Assessment & Plan:   Principal Problem:   NSTEMI (non-ST elevated myocardial infarction) (HCC) Was placed on IV heparin. Cardiac cath finding as above with high-grade mid LAD lesion which is severely calcified.  Cardiology recommends medical management with plan on atherectomy followed by PCI at [facility] which will be arranged as outpatient.  Patient stable to be discharged home per cardiology.  Has follow-up appointment on 5/7. Added aspirin.  Continue Lipitor, beta-blocker and lisinopril.    Needed as inpatient on 5/3 but recovered more quickly than expected and stable for discharge today.    Active Problems:  Acute respiratory failure with hypoxia Initially required oxygen.  No clear etiology does not appear to be volume overloaded.  Oxygen weaned off.  CT angiogram chest negative for PE.     Type 2 diabetes mellitus with complication, uncontrolled without long-term current use of insulin (HCC) A1c of 8.7.  Resume home oral hypoglycemic.  Needs optimal blood glucose control as outpatient and may need insulin.  History of PE/paroxysmal A.  fib Resume Xarelto from tomorrow.   Chronic kidney disease stage IIIa Renal function currently stable.  Monitor during outpatient follow-up.    OSA on CPAP    Bilateral lower extremity edema: chronic with venous stasis changes EF of 50% as per cardiac cath.  Appears euvolemic.  Continue low-dose Lasix.    Obesity, Class III, BMI 40-49.9 (morbid obesity) (HCC) Counseled on weight loss and exercise.  Bipolar disorder Continue Seroquel and Abilify  Hypothyroidism Continue Synthroid  Procedure: Cardiac cath, CT angiogram chest  Consult: Welton Flakes (cardiology) Family communication: None Disposition: Home   Discharge Instructions  Discharge Instructions    AMB Referral to Cardiac Rehabilitation - Phase II   Complete by: As directed    Diagnosis: NSTEMI   After initial evaluation and assessments completed: Virtual Based Care may be provided alone or in conjunction with Phase 2 Cardiac Rehab based on patient barriers.: Yes     Allergies as of 04/01/2020      Reactions   Zyprexa [olanzapine] Other (See Comments)   Allergy noted by ACT Team. No reaction specified (??)      Medication List    STOP taking these medications   amoxicillin-clavulanate 875-125 MG tablet Commonly known as: AUGMENTIN     TAKE these medications   Acetaminophen Extra Strength 500 MG tablet Generic drug: acetaminophen Take 1,000 mg by mouth 2 (two) times daily.   amLODipine 10 MG tablet Commonly known as: NORVASC Take 1 tablet (10 mg total) by mouth daily.   ARIPiprazole 20 MG tablet Commonly known as: ABILIFY Take 1 tablet (20 mg total) by mouth daily.   aspirin 81 MG EC tablet Take 1 tablet (81 mg total)  by mouth daily.   atorvastatin 10 MG tablet Commonly known as: LIPITOR Take 1 tablet (10 mg total) by mouth daily at 6 PM.   buPROPion 75 MG tablet Commonly known as: WELLBUTRIN Take 1 tablet (75 mg total) by mouth daily.   divalproex 500 MG DR tablet Commonly known as: DEPAKOTE Take  1 tablet (500 mg total) by mouth every 12 (twelve) hours.   furosemide 20 MG tablet Commonly known as: LASIX Take 1 tablet (20 mg total) by mouth daily.   glimepiride 2 MG tablet Commonly known as: AMARYL Take 2 mg by mouth daily with breakfast.   liothyronine 25 MCG tablet Commonly known as: CYTOMEL Take 25 mcg by mouth daily before breakfast.   lisinopril 40 MG tablet Commonly known as: ZESTRIL Take 1 tablet (40 mg total) by mouth daily.   metoprolol succinate 50 MG 24 hr tablet Commonly known as: TOPROL-XL Take 1 tablet (50 mg total) by mouth daily. Take with or immediately following a meal.   mirtazapine 15 MG disintegrating tablet Commonly known as: REMERON SOL-TAB Take 1 tablet (15 mg total) by mouth at bedtime.   QUEtiapine 25 MG tablet Commonly known as: SEROQUEL Take 25 mg by mouth 2 (two) times daily.   QUEtiapine 300 MG tablet Commonly known as: SEROQUEL Take 300 mg by mouth at bedtime.   rivaroxaban 20 MG Tabs tablet Commonly known as: XARELTO Take 1 tablet (20 mg total) by mouth daily with supper. Start taking on: Apr 02, 2020   sitaGLIPtin 25 MG tablet Commonly known as: JANUVIA Take 25 mg by mouth daily.      Follow-up Information    [facility] Follow up on 04/08/2020.   Specialty: Cardiology Why: at 2:30pm. Enter through the [facility] entrance Contact information: [address] [place] [place] [phone]       Dionisio David, MD Follow up on 04/04/2020.   Specialty: Cardiology Contact information: [place] [address] [phone]          Allergies  Allergen Reactions  . Zyprexa [Olanzapine] Other (See Comments)    Allergy noted by ACT Team. No reaction specified (??)        Procedures/Studies: CT ANGIO CHEST PE W OR WO CONTRAST  Result Date: 03/17/2020 CLINICAL DATA:  Shortness of breath and leg swelling after  electroconvulsive therapy. EXAM: CT ANGIOGRAPHY CHEST WITH CONTRAST TECHNIQUE: Multidetector CT imaging of the chest was performed using the standard protocol during bolus administration of intravenous contrast. Multiplanar CT image reconstructions and MIPs were obtained to evaluate the vascular anatomy. CONTRAST:  10mL OMNIPAQUE IOHEXOL 350 MG/ML SOLN COMPARISON:  Chest x-ray from same day. CT chest dated August 29, 2018. CTA chest dated September 02, 2015. FINDINGS: Cardiovascular: Satisfactory opacification of the pulmonary arteries to the segmental level. No evidence of pulmonary embolism. Normal heart size. No pericardial effusion. No thoracic aortic aneurysm or dissection. Mild aortic arch and severe three-vessel coronary artery atherosclerotic calcification. Mediastinum/Nodes: No enlarged mediastinal, hilar, or axillary lymph nodes. Mildly patulous esophagus. Thyroid gland and trachea demonstrate no significant findings. Lungs/Pleura: Patchy dependent consolidation and ground-glass densities in the right greater than left upper lobes, right middle lobe, and both lower lobes. No pleural effusion or pneumothorax. Upper Abdomen: No acute abnormality. Musculoskeletal: No chest wall abnormality. No acute or significant osseous findings. Review of the MIP images confirms the above findings. IMPRESSION: 1. No evidence of pulmonary embolism. 2. Extensive multifocal aspiration pneumonia.  3. Aortic Atherosclerosis (ICD10-I70.0). Electronically Signed   By: Obie Dredge M.D.   On: 03/17/2020 20:33   CARDIAC CATHETERIZATION  Result Date: 04/01/2020  Prox RCA to Mid RCA lesion is 100% stenosed.  Prox RCA lesion is 65% stenosed.  Prox Cx to Mid Cx lesion is 65% stenosed.  Prox LAD lesion is 70% stenosed.  Mid LAD lesion is 70% stenosed.  High grade lesion of mid LAD severely calcified, occluded mid RCA chronic with collaterals LAD, EF by echo 50% . Advise medical treatment for now, but will need atherectomy followd  by PCI and [facility] . Will arrange out patient.   US Venous Img Lower Bilateral (DVT)  Result Date: 03/18/2020 CLINICAL DATA:  69 year old male with pitting edema EXAM: BILATERAL LOWER EXTREMITY VENOUS DOPPLER ULTRASOUND TECHNIQUE: Gray-scale sonography with graded compression, as well as color Doppler and duplex ultrasound were performed to evaluate the lower extremity deep venous systems from the level of the common femoral vein and including the common femoral, femoral, profunda femoral, popliteal and calf veins including the posterior tibial, peroneal and gastrocnemius veins when visible. The superficial great saphenous vein was also interrogated. Spectral Doppler was utilized to evaluate flow at rest and with distal augmentation maneuvers in the common femoral, femoral and popliteal veins. COMPARISON:  None. FINDINGS: RIGHT LOWER EXTREMITY Common Femoral Vein: No evidence of thrombus. Normal compressibility, respiratory phasicity and response to augmentation. Saphenofemoral Junction: No evidence of thrombus. Normal compressibility and flow on color Doppler imaging. Profunda Femoral Vein: No evidence of thrombus. Normal compressibility and flow on color Doppler imaging. Femoral Vein: No evidence of thrombus. Normal compressibility, respiratory phasicity and response to augmentation. Popliteal Vein: No evidence of thrombus. Normal compressibility, respiratory phasicity and response to augmentation. Calf Veins: No thrombus of the posterior tibial vein. Peroneal vein not visualized. Superficial Great Saphenous Vein: No evidence of thrombus. Normal compressibility and flow on color Doppler imaging. Other Findings:  Mild edema LEFT LOWER EXTREMITY Common Femoral Vein: No evidence of thrombus. Normal compressibility, respiratory phasicity and response to augmentation. Saphenofemoral Junction: No evidence of thrombus. Normal compressibility and flow on color Doppler imaging. Profunda Femoral Vein: No evidence of  thrombus. Normal compressibility and flow on color Doppler imaging. Femoral Vein: No evidence of thrombus. Normal compressibility, respiratory phasicity and response to augmentation. Popliteal Vein: No evidence of thrombus. Normal compressibility, respiratory phasicity and response to augmentation. Calf Veins: No thrombus of the posterior tibial vein. Peroneal vein not visualized. Superficial Great Saphenous Vein: No evidence of thrombus. Normal compressibility and flow on color Doppler imaging. Other Findings:  Mild edema IMPRESSION: Sonographic survey of the bilateral lower extremity negative for DVT. Electronically Signed   By: Gilmer Mor D.O.   On: 03/18/2020 08:49   DG Chest Portable 1 View  Result Date: 03/31/2020 CLINICAL DATA:  Shortness of breath, evaluate for edema EXAM: PORTABLE CHEST 1 VIEW COMPARISON:  03/17/2020 FINDINGS: The heart size and mediastinal contours are within normal limits. Previously seen bilateral heterogeneous and interstitial airspace opacity is improved, nearly resolved. The visualized skeletal structures are unremarkable. IMPRESSION: Previously seen bilateral heterogeneous and interstitial airspace opacity is improved, nearly resolved, consistent with improved infection and/or edema. No new airspace opacity. Electronically Signed   By: Lauralyn Primes M.D.   On: 03/31/2020 09:32   DG Chest Port 1 View  Result Date: 03/17/2020 CLINICAL DATA:  Hypoxia. EXAM: PORTABLE CHEST 1 VIEW COMPARISON:  December 26, 2019. FINDINGS: The heart size and mediastinal contours are within normal limits. No pneumothorax or  pleural effusion is noted. Stable mild bilateral hazy densities are noted in both upper lobes which may represent scarring from prior inflammation, although superimposed acute inflammation cannot be excluded. The visualized skeletal structures are unremarkable. IMPRESSION: Stable mild bilateral hazy densities are noted in both upper lobes which may represent scarring from prior  inflammation, although superimposed acute inflammation cannot be excluded. Electronically Signed   By: Lupita Raider M.D.   On: 03/17/2020 11:58   ECHOCARDIOGRAM COMPLETE  Result Date: 03/18/2020    ECHOCARDIOGRAM REPORT   Patient Name:   MILBERT BIXLER Date of Exam: 03/18/2020 Medical Rec #:  [id number]        Height:       72.0 in Accession #:    [id number]       Weight:       275.1 lb Date of Birth:  1951-10-03        BSA:          2.440 m Patient Age:    68 years         BP:           99/66 mmHg Patient Gender: M                HR:           93 bpm. Exam Location:  [facility] Procedure: 2D Echo, Cardiac Doppler and Color Doppler Indications:     CHF- acute diastolic 428.31  History:         Patient has prior history of Echocardiogram examinations, most                  recent 06/19/2018. Risk Factors:Diabetes, Hypertension and Sleep                  Apnea. Bipolar 1 disorder.  Sonographer:     Cristela Blue RDCS (AE) Referring Phys:  [id number] Caryn Bee PACKER Diagnosing Phys: Adrian Blackwater MD  Sonographer Comments: Technically difficult study due to poor echo windows. IMPRESSIONS  1. Left ventricular ejection fraction, by estimation, is 50 to 55%. The left ventricle has low normal function. The left ventricle demonstrates global hypokinesis. Left ventricular diastolic parameters are consistent with Grade I diastolic dysfunction (impaired relaxation).  2. Right ventricular systolic function is mildly reduced. The right ventricular size is moderately enlarged. There is normal pulmonary artery systolic pressure.  3. Left atrial size was mild to moderately dilated.  4. Right atrial size was mild to moderately dilated.  5. The mitral valve is degenerative. Mild to moderate mitral valve regurgitation. No evidence of mitral stenosis.  6. Supraaortic stenosis. The aortic valve is abnormal. Aortic valve regurgitation is mild. Mild to moderate aortic valve stenosis.  7. The inferior vena cava is normal in size with greater than  50% respiratory variability, suggesting right atrial pressure of 3 mmHg. FINDINGS  Left Ventricle: Left ventricular ejection fraction, by estimation, is 50 to 55%. The left ventricle has low normal function. The left ventricle demonstrates global hypokinesis. The left ventricular internal cavity size was normal in size. There is no left ventricular hypertrophy. Left ventricular diastolic parameters are consistent with Grade I diastolic dysfunction (impaired relaxation). Right Ventricle: The right ventricular size is moderately enlarged. No increase in right ventricular wall thickness. Right ventricular systolic function is mildly reduced. There is normal pulmonary artery systolic pressure. The tricuspid regurgitant velocity is 1.75 m/s, and with an assumed right atrial pressure of 10 mmHg, the estimated right ventricular systolic pressure is 22.2  mmHg. Left Atrium: Left atrial size was mild to moderately dilated. Right Atrium: Right atrial size was mild to moderately dilated. Pericardium: There is no evidence of pericardial effusion. Mitral Valve: The mitral valve is degenerative in appearance. There is severe calcification of the mitral valve leaflet(s). Moderately decreased mobility of the mitral valve leaflets. Moderate to severe mitral annular calcification. Mild to moderate mitral valve regurgitation. No evidence of mitral valve stenosis. Tricuspid Valve: The tricuspid valve is normal in structure. Tricuspid valve regurgitation is mild . No evidence of tricuspid stenosis. Aortic Valve: Supraaortic stenosis. The aortic valve is abnormal. Aortic valve regurgitation is mild. Mild to moderate aortic stenosis is present. Aortic valve mean gradient measures 2.3 mmHg. Aortic valve peak gradient measures 4.6 mmHg. Aortic valve area, by VTI measures 2.31 cm. Pulmonic Valve: The pulmonic valve was normal in structure. Pulmonic valve regurgitation is mild. No evidence of pulmonic stenosis. Aorta: The aortic root is  normal in size and structure. Venous: The inferior vena cava is normal in size with greater than 50% respiratory variability, suggesting right atrial pressure of 3 mmHg. IAS/Shunts: No atrial level shunt detected by color flow Doppler.  LEFT VENTRICLE PLAX 2D LVIDd:         4.88 cm  Diastology LVIDs:         3.56 cm  LV e' lateral:   14.00 cm/s LV PW:         1.24 cm  LV E/e' lateral: 4.8 LV IVS:        1.60 cm  LV e' medial:    5.66 cm/s LVOT diam:     2.00 cm  LV E/e' medial:  11.8 LV SV:         35 LV SV Index:   15 LVOT Area:     3.14 cm  RIGHT VENTRICLE RV Basal diam:  3.83 cm RV S prime:     15.90 cm/s TAPSE (M-mode): 2.5 cm LEFT ATRIUM           Index       RIGHT ATRIUM           Index LA diam:      4.20 cm 1.72 cm/m  RA Area:     14.40 cm LA Vol (A2C): 29.2 ml 11.97 ml/m RA Volume:   30.50 ml  12.50 ml/m LA Vol (A4C): 42.6 ml 17.46 ml/m  AORTIC VALVE AV Area (Vmax):    1.61 cm AV Area (Vmean):   1.48 cm AV Area (VTI):     2.31 cm AV Vmax:           107.00 cm/s AV Vmean:          69.133 cm/s AV VTI:            0.154 m AV Peak Grad:      4.6 mmHg AV Mean Grad:      2.3 mmHg LVOT Vmax:         54.70 cm/s LVOT Vmean:        32.600 cm/s LVOT VTI:          0.113 m LVOT/AV VTI ratio: 0.73  AORTA Ao Root diam: 3.10 cm MITRAL VALVE               TRICUSPID VALVE MV Area (PHT): 3.06 cm    TR Peak grad:   12.2 mmHg MV Decel Time: 248 msec    TR Vmax:        175.00 cm/s MV E velocity: 66.90  cm/s MV A velocity: 95.60 cm/s  SHUNTS MV E/A ratio:  0.70        Systemic VTI:  0.11 m                            Systemic Diam: 2.00 cm Adrian Blackwater MD Electronically signed by Adrian Blackwater MD Signature Date/Time: 03/18/2020/4:06:35 PM    Final        Subjective: Seen and examined after returning from cath.  Denies any chest pain symptoms or shortness of breath.  Discharge Exam: Vitals:   04/01/20 0841 04/01/20 1131  BP:  (!) 158/93  Pulse:  85  Resp:  18  Temp:    SpO2: 98% 99%   Vitals:   04/01/20 0439  04/01/20 0738 04/01/20 0841 04/01/20 1131  BP: 128/73 (!) 139/91  (!) 158/93  Pulse: 75 81  85  Resp: 16 18  18   Temp: 97.8 F (36.6 C) 97.7 F (36.5 C)    TempSrc: Oral Oral    SpO2: 99% 98% 98% 99%  Weight:      Height:        General: Elderly obese male not in distress HEENT: Moist mucosa, supple neck Chest: Clear bilaterally CVs: Normal S1-S2, no murmurs GI: Soft, nondistended, nontender Musculoskeletal: Trace edema bilaterally, right groin cardiac cath site appears clean without any bleeding or hematoma     The results of significant diagnostics from this hospitalization (including imaging, microbiology, ancillary and laboratory) are listed below for reference.     Microbiology: Recent Results (from the past 240 hour(s))  SARS CORONAVIRUS 2 (TAT 6-24 HRS) Nasopharyngeal Nasopharyngeal Swab     Status: None   Collection Time: 03/27/20 12:34 PM   Specimen: Nasopharyngeal Swab  Result Value Ref Range Status   SARS Coronavirus 2 NEGATIVE NEGATIVE Final    Comment: (NOTE) SARS-CoV-2 target nucleic acids are NOT DETECTED. The SARS-CoV-2 RNA is generally detectable in upper and lower respiratory specimens during the acute phase of infection. Negative results do not preclude SARS-CoV-2 infection, do not rule out co-infections with other pathogens, and should not be used as the sole basis for treatment or other patient management decisions. Negative results must be combined with clinical observations, patient history, and epidemiological information. The expected result is Negative. Fact Sheet for Patients: HairSlick.no Fact Sheet for Healthcare Providers: quierodirigir.com This test is not yet approved or cleared by the [place] FDA and  has been authorized for detection and/or diagnosis of SARS-CoV-2 by FDA under an Emergency Use Authorization (EUA). This EUA will remain  in effect (meaning this test can be  used) for the duration of the COVID-19 declaration under Section 56 4(b)(1) of the Act, 21 U.S.C. section 360bbb-3(b)(1), unless the authorization is terminated or revoked sooner. Performed at [facility], [address], [place], [place] [address]   Respiratory Panel by RT PCR (Flu A&B, Covid) - Nasopharyngeal Swab     Status: None   Collection Time: 03/31/20 10:06 AM   Specimen: Nasopharyngeal Swab  Result Value Ref Range Status   SARS Coronavirus 2 by RT PCR NEGATIVE NEGATIVE Final    Comment: (NOTE) SARS-CoV-2 target nucleic acids are NOT DETECTED. The SARS-CoV-2 RNA is generally detectable in upper respiratoy specimens during the acute phase of infection. The lowest concentration of SARS-CoV-2 viral copies this assay can detect is 131 copies/mL. A negative result does not preclude SARS-Cov-2 infection and should not be used as the sole  basis for treatment or other patient management decisions. A negative result may occur with  improper specimen collection/handling, submission of specimen other than nasopharyngeal swab, presence of viral mutation(s) within the areas targeted by this assay, and inadequate number of viral copies (<131 copies/mL). A negative result must be combined with clinical observations, patient history, and epidemiological information. The expected result is Negative. Fact Sheet for Patients:  https://www.moore.com/ Fact Sheet for Healthcare Providers:  https://www.young.biz/ This test is not yet ap proved or cleared by the [place] FDA and  has been authorized for detection and/or diagnosis of SARS-CoV-2 by FDA under an Emergency Use Authorization (EUA). This EUA will remain  in effect (meaning this test can be used) for the duration of the COVID-19 declaration under Section 564(b)(1) of the Act, 21 U.S.C. section 360bbb-3(b)(1), unless the authorization is terminated or revoked sooner.    Influenza A by  PCR NEGATIVE NEGATIVE Final   Influenza B by PCR NEGATIVE NEGATIVE Final    Comment: (NOTE) The Xpert Xpress SARS-CoV-2/FLU/RSV assay is intended as an aid in  the diagnosis of influenza from Nasopharyngeal swab specimens and  should not be used as a sole basis for treatment. Nasal washings and  aspirates are unacceptable for Xpert Xpress SARS-CoV-2/FLU/RSV  testing. Fact Sheet for Patients: https://www.moore.com/ Fact Sheet for Healthcare Providers: https://www.young.biz/ This test is not yet approved or cleared by the [place] FDA and  has been authorized for detection and/or diagnosis of SARS-CoV-2 by  FDA under an Emergency Use Authorization (EUA). This EUA will remain  in effect (meaning this test can be used) for the duration of the  Covid-19 declaration under Section 564(b)(1) of the Act, 21  U.S.C. section 360bbb-3(b)(1), unless the authorization is  terminated or revoked. Performed at [facility], [address] [place], [place] [address]      Labs: BNP (last 3 results) Recent Labs    03/19/20 0936 03/31/20 0938  BNP 65.0 138.0*   Basic Metabolic Panel: Recent Labs  Lab 03/31/20 0938 03/31/20 1233 04/01/20 0155  NA 137 137 137  K 4.1 3.6 3.6  CL 100 103 103  CO2 31 28 28   GLUCOSE 169* 155* 116*  BUN 20 18 22   CREATININE 1.95* 1.55* 1.58*  CALCIUM 10.7* 9.2 9.2   Liver Function Tests: Recent Labs  Lab 03/31/20 0938  AST 14*  ALT 11  ALKPHOS 59  BILITOT 0.6  PROT 6.6  ALBUMIN 2.8*   No results for input(s): LIPASE, AMYLASE in the last 168 hours. No results for input(s): AMMONIA in the last 168 hours. CBC: Recent Labs  Lab 03/31/20 0938 03/31/20 1233 04/01/20 0155  WBC 5.1 4.9 4.7  NEUTROABS 3.2  --   --   HGB 12.1* 11.5* 11.1*  HCT 38.5* 36.0* 35.3*  MCV 82.3 81.1 81.5  PLT 207 187 249   Cardiac Enzymes: No results for input(s): CKTOTAL, CKMB, CKMBINDEX, TROPONINI in the last 168  hours. BNP: Invalid input(s): POCBNP CBG: Recent Labs  Lab 03/31/20 2139 04/01/20 0740 04/01/20 0833 04/01/20 0941 04/01/20 1132  GLUCAP 118* 67* 109* 104* 84   D-Dimer No results for input(s): DDIMER in the last 72 hours. Hgb A1c Recent Labs    03/31/20 1233  HGBA1C 8.7*   Lipid Profile No results for input(s): CHOL, HDL, LDLCALC, TRIG, CHOLHDL, LDLDIRECT in the last 72 hours. Thyroid function studies No results for input(s): TSH, T4TOTAL, T3FREE, THYROIDAB in the last 72 hours.  Invalid input(s): FREET3 Anemia work up No  results for input(s): VITAMINB12, FOLATE, FERRITIN, TIBC, IRON, RETICCTPCT in the last 72 hours. Urinalysis    Component Value Date/Time   COLORURINE YELLOW 11/01/2019 1300   APPEARANCEUR CLEAR 11/01/2019 1300   APPEARANCEUR Cloudy 08/01/2014 1614   LABSPEC 1.010 11/01/2019 1300   LABSPEC 1.008 08/01/2014 1614   PHURINE 5.0 11/01/2019 1300   GLUCOSEU 50 (A) 11/01/2019 1300   GLUCOSEU Negative 08/01/2014 1614   HGBUR SMALL (A) 11/01/2019 1300   BILIRUBINUR NEGATIVE 11/01/2019 1300   BILIRUBINUR Negative 08/01/2014 1614   KETONESUR NEGATIVE 11/01/2019 1300   PROTEINUR NEGATIVE 11/01/2019 1300   UROBILINOGEN 0.2 07/01/2014 1809   NITRITE NEGATIVE 11/01/2019 1300   LEUKOCYTESUR SMALL (A) 11/01/2019 1300   LEUKOCYTESUR Negative 08/01/2014 1614   Sepsis Labs Invalid input(s): PROCALCITONIN,  WBC,  LACTICIDVEN Microbiology Recent Results (from the past 240 hour(s))  SARS CORONAVIRUS 2 (TAT 6-24 HRS) Nasopharyngeal Nasopharyngeal Swab     Status: None   Collection Time: 03/27/20 12:34 PM   Specimen: Nasopharyngeal Swab  Result Value Ref Range Status   SARS Coronavirus 2 NEGATIVE NEGATIVE Final    Comment: (NOTE) SARS-CoV-2 target nucleic acids are NOT DETECTED. The SARS-CoV-2 RNA is generally detectable in upper and lower respiratory specimens during the acute phase of infection. Negative results do not preclude SARS-CoV-2 infection, do not  rule out co-infections with other pathogens, and should not be used as the sole basis for treatment or other patient management decisions. Negative results must be combined with clinical observations, patient history, and epidemiological information. The expected result is Negative. Fact Sheet for Patients: HairSlick.no Fact Sheet for Healthcare Providers: quierodirigir.com This test is not yet approved or cleared by the [place] FDA and  has been authorized for detection and/or diagnosis of SARS-CoV-2 by FDA under an Emergency Use Authorization (EUA). This EUA will remain  in effect (meaning this test can be used) for the duration of the COVID-19 declaration under Section 56 4(b)(1) of the Act, 21 U.S.C. section 360bbb-3(b)(1), unless the authorization is terminated or revoked sooner. Performed at [facility], [address], [place], [place] [address]   Respiratory Panel by RT PCR (Flu A&B, Covid) - Nasopharyngeal Swab     Status: None   Collection Time: 03/31/20 10:06 AM   Specimen: Nasopharyngeal Swab  Result Value Ref Range Status   SARS Coronavirus 2 by RT PCR NEGATIVE NEGATIVE Final    Comment: (NOTE) SARS-CoV-2 target nucleic acids are NOT DETECTED. The SARS-CoV-2 RNA is generally detectable in upper respiratoy specimens during the acute phase of infection. The lowest concentration of SARS-CoV-2 viral copies this assay can detect is 131 copies/mL. A negative result does not preclude SARS-Cov-2 infection and should not be used as the sole basis for treatment or other patient management decisions. A negative result may occur with  improper specimen collection/handling, submission of specimen other than nasopharyngeal swab, presence of viral mutation(s) within the areas targeted by this assay, and inadequate number of viral copies (<131 copies/mL). A negative result must be combined with clinical observations,  patient history, and epidemiological information. The expected result is Negative. Fact Sheet for Patients:  https://www.moore.com/ Fact Sheet for Healthcare Providers:  https://www.young.biz/ This test is not yet ap proved or cleared by the [place] FDA and  has been authorized for detection and/or diagnosis of SARS-CoV-2 by FDA under an Emergency Use Authorization (EUA). This EUA will remain  in effect (meaning this test can be used) for the duration of the COVID-19 declaration under Section  564(b)(1) of the Act, 21 U.S.C. section 360bbb-3(b)(1), unless the authorization is terminated or revoked sooner.    Influenza A by PCR NEGATIVE NEGATIVE Final   Influenza B by PCR NEGATIVE NEGATIVE Final    Comment: (NOTE) The Xpert Xpress SARS-CoV-2/FLU/RSV assay is intended as an aid in  the diagnosis of influenza from Nasopharyngeal swab specimens and  should not be used as a sole basis for treatment. Nasal washings and  aspirates are unacceptable for Xpert Xpress SARS-CoV-2/FLU/RSV  testing. Fact Sheet for Patients: https://www.moore.com/ Fact Sheet for Healthcare Providers: https://www.young.biz/ This test is not yet approved or cleared by the [place] FDA and  has been authorized for detection and/or diagnosis of SARS-CoV-2 by  FDA under an Emergency Use Authorization (EUA). This EUA will remain  in effect (meaning this test can be used) for the duration of the  Covid-19 declaration under Section 564(b)(1) of the Act, 21  U.S.C. section 360bbb-3(b)(1), unless the authorization is  terminated or revoked. Performed at [facility], [address], [place], [place] [address]      Time coordinating discharge: 35 minutes  SIGNED:   Eddie North, MD  [facility] 04/01/2020, 3:56 PM Pager   If 7PM-7AM, please contact night-coverage [email or web address] Password [email or web address]

## 2020-04-01 NOTE — Progress Notes (Signed)
SUBJECTIVE: no chest pain   Vitals:   03/31/20 1920 04/01/20 0439 04/01/20 0738 04/01/20 0841  BP: 135/79 128/73 (!) 139/91   Pulse: 86 75 81   Resp: (!) 21 16 18    Temp: 97.8 F (36.6 C) 97.8 F (36.6 C) 97.7 F (36.5 C)   TempSrc: Oral Oral Oral   SpO2: 96% 99% 98% 98%  Weight:      Height:        Intake/Output Summary (Last 24 hours) at 04/01/2020 0916 Last data filed at 04/01/2020 0527 Gross per 24 hour  Intake 1148.81 ml  Output 1625 ml  Net -476.19 ml    LABS: Basic Metabolic Panel: Recent Labs    03/31/20 1233 04/01/20 0155  NA 137 137  K 3.6 3.6  CL 103 103  CO2 28 28  GLUCOSE 155* 116*  BUN 18 22  CREATININE 1.55* 1.58*  CALCIUM 9.2 9.2   Liver Function Tests: Recent Labs    03/31/20 0938  AST 14*  ALT 11  ALKPHOS 59  BILITOT 0.6  PROT 6.6  ALBUMIN 2.8*   No results for input(s): LIPASE, AMYLASE in the last 72 hours. CBC: Recent Labs    03/31/20 0938 03/31/20 0938 03/31/20 1233 04/01/20 0155  WBC 5.1   < > 4.9 4.7  NEUTROABS 3.2  --   --   --   HGB 12.1*   < > 11.5* 11.1*  HCT 38.5*   < > 36.0* 35.3*  MCV 82.3   < > 81.1 81.5  PLT 207   < > 187 249   < > = values in this interval not displayed.   Cardiac Enzymes: No results for input(s): CKTOTAL, CKMB, CKMBINDEX, TROPONINI in the last 72 hours. BNP: Invalid input(s): POCBNP D-Dimer: No results for input(s): DDIMER in the last 72 hours. Hemoglobin A1C: Recent Labs    03/31/20 1233  HGBA1C 8.7*   Fasting Lipid Panel: No results for input(s): CHOL, HDL, LDLCALC, TRIG, CHOLHDL, LDLDIRECT in the last 72 hours. Thyroid Function Tests: No results for input(s): TSH, T4TOTAL, T3FREE, THYROIDAB in the last 72 hours.  Invalid input(s): FREET3 Anemia Panel: No results for input(s): VITAMINB12, FOLATE, FERRITIN, TIBC, IRON, RETICCTPCT in the last 72 hours.   PHYSICAL EXAM General: Well developed, well nourished, in no acute distress HEENT:  Normocephalic and atramatic Neck:  No JVD.   Lungs: Clear bilaterally to auscultation and percussion. Heart: HRRR . Normal S1 and S2 without gallops or murmurs.  Abdomen: Bowel sounds are positive, abdomen soft and non-tender  Msk:  Back normal, normal gait. Normal strength and tone for age. Extremities: No clubbing, cyanosis or edema.   Neuro: Alert and oriented X 3. 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 May go home with f/u Friday 10 am, will arrange out patient at [facility].  Principal Problem:   NSTEMI (non-ST elevated myocardial infarction) (HCC) Active Problems:   Hypertension   Type 2 diabetes mellitus with complication, without long-term current use of insulin (HCC)   OSA on CPAP   Bilateral lower extremity edema: chronic with venous stasis changes   Obesity, Class III, BMI 40-49.9 (morbid obesity) (HCC)   NSTEMI (non-ST elevation myocardial infarction) (HCC)    [facility], MD, [facility] 04/01/2020 9:16 AM

## 2020-04-01 NOTE — Consult Note (Signed)
ANTICOAGULATION CONSULT NOTE - Follow up Consult  Pharmacy Consult for Heparin Indication: chest pain/ACS  Allergies  Allergen Reactions  . Zyprexa [Olanzapine] Other (See Comments)    Allergy noted by ACT Team. No reaction specified (??)    Patient Measurements: Height: 6' (182.9 cm) Weight: 130.4 kg (287 lb 6.4 oz) IBW/kg (Calculated) : 77.6 Heparin Dosing Weight: 106.7 kg  Vital Signs: Temp: 97.8 F (36.6 C) (05/03 1920) Temp Source: Oral (05/03 1920) BP: 135/79 (05/03 1920) Pulse Rate: 86 (05/03 1920)  Labs: Recent Labs    03/31/20 0938 03/31/20 0938 03/31/20 1220 03/31/20 1233 03/31/20 1910 03/31/20 2056 04/01/20 0155  HGB 12.1*   < >  --  11.5*  --   --  11.1*  HCT 38.5*  --   --  36.0*  --   --  35.3*  PLT 207  --   --  187  --   --  249  APTT  --   --   --   --  70*  --  81*  LABPROT  --   --   --   --  16.4*  --   --   INR  --   --   --   --  1.4*  --   --   HEPARINUNFRC  --   --   --   --  2.96*  --   --   CREATININE 1.95*  --   --  1.55*  --   --  1.58*  TROPONINIHS 755*   < > 852*  --  481* 392*  --    < > = values in this interval not displayed.    Estimated Creatinine Clearance: 64.2 mL/min (A) (by C-G formula based on SCr of 1.58 mg/dL (H)).   Assessment: 69 year old male admitted with chest pain ordered to start heparin drip.  Pt with history of paroxysmal atrial fibrillation and bilateral PE on Xarelto PTA, last dose at 0800 this AM.    Heparin infusion started at 1500 units/hr (no bolus) Of note, tried multiple times to get baseline labs with sample hemolyzed x2 and RN sending off 3rd tube.   Goal of Therapy:  aPTT 66-102 seconds Monitor platelets by anticoagulation protocol: Yes   Plan:  aPTT therapeutic x1. Will continue heparin drip at current rate and recheck aPTT in 6h to confirm. HL in AM to follow for correlation with aPTT.  CBC with AM labs.  5/4 0155 aPTT 81, therapeutic x 2.  CBC stable.  Continue heparin at current rate and  f/u labs daily in am per protocol.  Pharmacy will continue to follow.  Wayland Denis, PharmD 04/01/2020,2:47 AM

## 2020-04-01 NOTE — Progress Notes (Signed)
Dr. Welton Flakes at bedside: MD spoke with pt. And relative via phone re: cath results. Both verbalized understanding of conversation.

## 2020-04-04 DIAGNOSIS — G4739 Other sleep apnea: Secondary | ICD-10-CM | POA: Diagnosis not present

## 2020-04-04 DIAGNOSIS — I10 Essential (primary) hypertension: Secondary | ICD-10-CM | POA: Diagnosis not present

## 2020-04-04 DIAGNOSIS — I219 Acute myocardial infarction, unspecified: Secondary | ICD-10-CM | POA: Diagnosis not present

## 2020-04-04 DIAGNOSIS — I2510 Atherosclerotic heart disease of native coronary artery without angina pectoris: Secondary | ICD-10-CM | POA: Diagnosis not present

## 2020-04-04 DIAGNOSIS — E782 Mixed hyperlipidemia: Secondary | ICD-10-CM | POA: Diagnosis not present

## 2020-04-04 DIAGNOSIS — I509 Heart failure, unspecified: Secondary | ICD-10-CM | POA: Diagnosis not present

## 2020-04-04 DIAGNOSIS — R609 Edema, unspecified: Secondary | ICD-10-CM | POA: Diagnosis not present

## 2020-04-07 ENCOUNTER — Telehealth: Payer: Self-pay | Admitting: Family

## 2020-04-07 NOTE — Telephone Encounter (Signed)
Unable to reach patient regarding appointment for two days then patient family called back to cancel appointment and will call back to reschedule if they change their mind.   Christian [name], [place]

## 2020-04-08 ENCOUNTER — Ambulatory Visit: Payer: Medicare PPO | Admitting: Family

## 2020-04-10 ENCOUNTER — Encounter: Payer: Self-pay | Admitting: *Deleted

## 2020-04-10 ENCOUNTER — Other Ambulatory Visit: Payer: Self-pay

## 2020-04-10 ENCOUNTER — Encounter: Payer: Medicare PPO | Attending: Cardiovascular Disease | Admitting: *Deleted

## 2020-04-10 DIAGNOSIS — I129 Hypertensive chronic kidney disease with stage 1 through stage 4 chronic kidney disease, or unspecified chronic kidney disease: Secondary | ICD-10-CM | POA: Insufficient documentation

## 2020-04-10 DIAGNOSIS — Z79899 Other long term (current) drug therapy: Secondary | ICD-10-CM | POA: Insufficient documentation

## 2020-04-10 DIAGNOSIS — Z7901 Long term (current) use of anticoagulants: Secondary | ICD-10-CM | POA: Insufficient documentation

## 2020-04-10 DIAGNOSIS — Z79890 Hormone replacement therapy (postmenopausal): Secondary | ICD-10-CM | POA: Insufficient documentation

## 2020-04-10 DIAGNOSIS — Z7984 Long term (current) use of oral hypoglycemic drugs: Secondary | ICD-10-CM | POA: Insufficient documentation

## 2020-04-10 DIAGNOSIS — G4733 Obstructive sleep apnea (adult) (pediatric): Secondary | ICD-10-CM | POA: Insufficient documentation

## 2020-04-10 DIAGNOSIS — Z86718 Personal history of other venous thrombosis and embolism: Secondary | ICD-10-CM | POA: Insufficient documentation

## 2020-04-10 DIAGNOSIS — E785 Hyperlipidemia, unspecified: Secondary | ICD-10-CM | POA: Insufficient documentation

## 2020-04-10 DIAGNOSIS — E1122 Type 2 diabetes mellitus with diabetic chronic kidney disease: Secondary | ICD-10-CM | POA: Insufficient documentation

## 2020-04-10 DIAGNOSIS — Z7982 Long term (current) use of aspirin: Secondary | ICD-10-CM | POA: Insufficient documentation

## 2020-04-10 DIAGNOSIS — M109 Gout, unspecified: Secondary | ICD-10-CM | POA: Insufficient documentation

## 2020-04-10 DIAGNOSIS — F319 Bipolar disorder, unspecified: Secondary | ICD-10-CM | POA: Insufficient documentation

## 2020-04-10 DIAGNOSIS — N189 Chronic kidney disease, unspecified: Secondary | ICD-10-CM | POA: Insufficient documentation

## 2020-04-10 DIAGNOSIS — I214 Non-ST elevation (NSTEMI) myocardial infarction: Secondary | ICD-10-CM | POA: Insufficient documentation

## 2020-04-10 NOTE — Progress Notes (Signed)
Completed virtual orientation today.  EP evaluation is scheduled for Tuesday 5/18 at 1pm.  Documentation for diagnosis can be found in [facility] encounter 03/31/20.

## 2020-04-11 DIAGNOSIS — E1121 Type 2 diabetes mellitus with diabetic nephropathy: Secondary | ICD-10-CM | POA: Diagnosis not present

## 2020-04-11 DIAGNOSIS — I209 Angina pectoris, unspecified: Secondary | ICD-10-CM | POA: Diagnosis not present

## 2020-04-11 DIAGNOSIS — N1832 Chronic kidney disease, stage 3b: Secondary | ICD-10-CM | POA: Diagnosis not present

## 2020-04-15 ENCOUNTER — Encounter: Payer: Medicare PPO | Admitting: *Deleted

## 2020-04-15 ENCOUNTER — Other Ambulatory Visit: Payer: Self-pay

## 2020-04-15 VITALS — Ht 72.9 in | Wt 298.5 lb

## 2020-04-15 DIAGNOSIS — E1122 Type 2 diabetes mellitus with diabetic chronic kidney disease: Secondary | ICD-10-CM | POA: Diagnosis not present

## 2020-04-15 DIAGNOSIS — Z7982 Long term (current) use of aspirin: Secondary | ICD-10-CM | POA: Diagnosis not present

## 2020-04-15 DIAGNOSIS — I129 Hypertensive chronic kidney disease with stage 1 through stage 4 chronic kidney disease, or unspecified chronic kidney disease: Secondary | ICD-10-CM | POA: Diagnosis not present

## 2020-04-15 DIAGNOSIS — Z79899 Other long term (current) drug therapy: Secondary | ICD-10-CM | POA: Diagnosis not present

## 2020-04-15 DIAGNOSIS — I214 Non-ST elevation (NSTEMI) myocardial infarction: Secondary | ICD-10-CM | POA: Diagnosis not present

## 2020-04-15 DIAGNOSIS — E785 Hyperlipidemia, unspecified: Secondary | ICD-10-CM | POA: Diagnosis not present

## 2020-04-15 DIAGNOSIS — Z79890 Hormone replacement therapy (postmenopausal): Secondary | ICD-10-CM | POA: Diagnosis not present

## 2020-04-15 DIAGNOSIS — N189 Chronic kidney disease, unspecified: Secondary | ICD-10-CM | POA: Diagnosis not present

## 2020-04-15 DIAGNOSIS — G4733 Obstructive sleep apnea (adult) (pediatric): Secondary | ICD-10-CM | POA: Diagnosis not present

## 2020-04-15 DIAGNOSIS — M109 Gout, unspecified: Secondary | ICD-10-CM | POA: Diagnosis not present

## 2020-04-15 DIAGNOSIS — F319 Bipolar disorder, unspecified: Secondary | ICD-10-CM | POA: Diagnosis not present

## 2020-04-15 DIAGNOSIS — Z7901 Long term (current) use of anticoagulants: Secondary | ICD-10-CM | POA: Diagnosis not present

## 2020-04-15 DIAGNOSIS — Z86718 Personal history of other venous thrombosis and embolism: Secondary | ICD-10-CM | POA: Diagnosis not present

## 2020-04-15 DIAGNOSIS — Z7984 Long term (current) use of oral hypoglycemic drugs: Secondary | ICD-10-CM | POA: Diagnosis not present

## 2020-04-15 NOTE — Progress Notes (Signed)
Cardiac Individual Treatment Plan  Patient Details  Name: Christian [name] MRN: [id number] Date of Birth: 12/24/1950 Referring Provider:     Cardiac Rehab from 04/15/2020 in [facility] Cardiac and Pulmonary Rehab  Referring Provider  Neoma Laming  MD      Initial Encounter Date:    Cardiac Rehab from 04/15/2020 in [facility] Cardiac and Pulmonary Rehab  Date  04/15/20      Visit Diagnosis: NSTEMI (non-ST elevated myocardial infarction) [facility])  Patient's Home Medications on Admission:  Current Outpatient Medications:  .  ACETAMINOPHEN EXTRA STRENGTH 500 MG tablet, Take 1,000 mg by mouth 2 (two) times daily., Disp: , Rfl:  .  amLODipine (NORVASC) 10 MG tablet, Take 1 tablet (10 mg total) by mouth daily., Disp: 30 tablet, Rfl: 0 .  ARIPiprazole (ABILIFY) 20 MG tablet, Take 1 tablet (20 mg total) by mouth daily., Disp: 30 tablet, Rfl: 0 .  aspirin EC 81 MG EC tablet, Take 1 tablet (81 mg total) by mouth daily., Disp: 30 tablet, Rfl: 0 .  atorvastatin (LIPITOR) 10 MG tablet, Take 1 tablet (10 mg total) by mouth daily at 6 PM. (Patient taking differently: Take 80 mg by mouth daily at 6 PM. ), Disp: 30 tablet, Rfl: 0 .  buPROPion (WELLBUTRIN) 75 MG tablet, Take 1 tablet (75 mg total) by mouth daily., Disp: 30 tablet, Rfl: 0 .  divalproex (DEPAKOTE) 500 MG DR tablet, Take 1 tablet (500 mg total) by mouth every 12 (twelve) hours., Disp: 60 tablet, Rfl: 0 .  furosemide (LASIX) 20 MG tablet, Take 1 tablet (20 mg total) by mouth daily., Disp: 30 tablet, Rfl: 0 .  glimepiride (AMARYL) 2 MG tablet, Take 2 mg by mouth daily with breakfast., Disp: , Rfl:  .  liothyronine (CYTOMEL) 25 MCG tablet, Take 25 mcg by mouth daily before breakfast. , Disp: , Rfl:  .  lisinopril (ZESTRIL) 40 MG tablet, Take 1 tablet (40 mg total) by mouth daily., Disp: 30 tablet, Rfl: 0 .  metoprolol succinate (TOPROL-XL) 50 MG 24 hr tablet, Take 1 tablet (50 mg total) by mouth daily. Take with or immediately following a meal., Disp: 60  tablet, Rfl: 3 .  mirtazapine (REMERON SOL-TAB) 15 MG disintegrating tablet, Take 1 tablet (15 mg total) by mouth at bedtime., Disp: 30 tablet, Rfl: 0 .  QUEtiapine (SEROQUEL) 25 MG tablet, Take 25 mg by mouth 2 (two) times daily. , Disp: , Rfl:  .  QUEtiapine (SEROQUEL) 300 MG tablet, Take 300 mg by mouth at bedtime., Disp: , Rfl:  .  rivaroxaban (XARELTO) 20 MG TABS tablet, Take 1 tablet (20 mg total) by mouth daily with supper., Disp: 30 tablet, Rfl: 0 .  sitaGLIPtin (JANUVIA) 25 MG tablet, Take 25 mg by mouth daily., Disp: , Rfl:  No current facility-administered medications for this visit.  Facility-Administered Medications Ordered in Other Visits:  .  ondansetron (ZOFRAN) injection 4 mg, 4 mg, Intravenous, Once PRN, Ronelle Nigh Jamse Mead, MD  Past Medical History: Past Medical History:  Diagnosis Date  . Bilateral lower extremity edema: chronic with venous stasis changes 06/02/2014  . Bipolar 1 disorder ([place])   . Bipolar disorder ([place])   . Chronic kidney disease   . Gout   . Hx of blood clots   . Hypertension   . Mood swings   . OSA on CPAP 06/02/2014  . Other and unspecified hyperlipidemia 06/02/2014  . Type II or unspecified type diabetes mellitus with unspecified complication, uncontrolled 06/02/2014    Tobacco Use:  Social History   Tobacco Use  Smoking Status Never Smoker  Smokeless Tobacco Never Used    Labs: Recent Review Flowsheet Data    Labs for ITP Cardiac and Pulmonary Rehab Latest Ref Rng & Units 09/01/2015 12/28/2015 01/30/2020 03/17/2020 03/31/2020   Cholestrol 0 - 200 mg/dL - 108 - - -   LDLCALC 0 - 99 mg/dL - 63 - - -   HDL >40 mg/dL - 33(L) - - -   Trlycerides <150 mg/dL - 62 - - -   Hemoglobin A1c 4.8 - 5.6 % 7.5(H) - 7.1(H) - 8.7(H)   PHART 7.350 - 7.450 7.51(H) - - 7.40 -   PCO2ART 32.0 - 48.0 mmHg 27(L) - - 41 -   HCO3 20.0 - 28.0 mmol/L 21.5 - - 25.4 -   TCO2 0 - 100 mmol/L - - - - -   ACIDBASEDEF 0.0 - 2.0 mmol/L 0.3 - - - -   O2SAT % 98.2 - - 90.6 -        Exercise Target Goals: Exercise Program Goal: Individual exercise prescription set using results from initial 6 min walk test and THRR while considering  patient's activity barriers and safety.   Exercise Prescription Goal: Initial exercise prescription builds to 30-45 minutes a day of aerobic activity, 2-3 days per week.  Home exercise guidelines will be given to patient during program as part of exercise prescription that the participant will acknowledge.   Education: Aerobic Exercise & Resistance Training: - Gives group verbal and written instruction on the various components of exercise. Focuses on aerobic and resistive training programs and the benefits of this training and how to safely progress through these programs..   Education: Exercise & Equipment Safety: - Individual verbal instruction and demonstration of equipment use and safety with use of the equipment.   Cardiac Rehab from 04/15/2020 in [facility] Cardiac and Pulmonary Rehab  Date  04/15/20  Educator  [facility]  Instruction Review Code  1- Verbalizes Understanding      Education: Exercise Physiology & General Exercise Guidelines: - Group verbal and written instruction with models to review the exercise physiology of the cardiovascular system and associated critical values. Provides general exercise guidelines with specific guidelines to those with heart or lung disease.    Education: Flexibility, Balance, Mind/Body Relaxation: Provides group verbal/written instruction on the benefits of flexibility and balance training, including mind/body exercise modes such as yoga, pilates and tai chi.  Demonstration and skill practice provided.   Activity Barriers & Risk Stratification: Activity Barriers & Cardiac Risk Stratification - 04/15/20 1536      Activity Barriers & Cardiac Risk Stratification   Activity Barriers  Balance Concerns;Assistive Device;Decreased Ventricular Function;Deconditioning;Muscular Weakness;Back  Problems;Other (comment);Joint Problems    Comments  occasional back pain, torn rotator cuff (bilateral)    Cardiac Risk Stratification  High       6 Minute Walk: 6 Minute Walk    Row Name 04/15/20 1535         6 Minute Walk   Distance  847 feet     Walk Time  6 minutes     # of Rest Breaks  0     MPH  1.6     METS  2.26     RPE  11     Perceived Dyspnea   3     VO2 Peak  7.89     Symptoms  Yes (comment)     Comments  SOB, hip pain 5/10  Resting HR  86 bpm     Resting BP  146/72     Resting Oxygen Saturation   97 %     Exercise Oxygen Saturation  during 6 min walk  96 %     Max Ex. HR  125 bpm     Max Ex. BP  176/88     2 Minute Post BP  142/78        Oxygen Initial Assessment:   Oxygen Re-Evaluation:   Oxygen Discharge (Final Oxygen Re-Evaluation):   Initial Exercise Prescription: Initial Exercise Prescription - 04/15/20 1500      Date of Initial Exercise RX and Referring Provider   Date  04/15/20    Referring Provider  Neoma Laming  MD      Treadmill   MPH  1.4    Grade  0.5    Minutes  15    METs  2.17      NuStep   Level  1    SPM  80    Minutes  15    METs  2      T5 Nustep   Level  1    SPM  80    Minutes  15    METs  2      Biostep-RELP   Level  1    SPM  50    Minutes  15    METs  2      Prescription Details   Frequency (times per week)  2    Duration  Progress to 30 minutes of continuous aerobic without signs/symptoms of physical distress      Intensity   THRR 40-80% of Max Heartrate  113-140    Ratings of Perceived Exertion  11-13    Perceived Dyspnea  0-4      Progression   Progression  Continue to progress workloads to maintain intensity without signs/symptoms of physical distress.      Resistance Training   Training Prescription  Yes    Weight  3 lb    Reps  10-15       Perform Capillary Blood Glucose checks as needed.  Exercise Prescription Changes: Exercise Prescription Changes    Row Name 04/15/20 1500              Response to Exercise   Blood Pressure (Admit)  146/72       Blood Pressure (Exercise)  176/88       Blood Pressure (Exit)  142/78       Heart Rate (Admit)  86 bpm       Heart Rate (Exercise)  125 bpm       Heart Rate (Exit)  90 bpm       Oxygen Saturation (Admit)  97 %       Oxygen Saturation (Exercise)  96 %       Rating of Perceived Exertion (Exercise)  11       Perceived Dyspnea (Exercise)  3       Symptoms  SOB, hip pain 5/10, shuffling gait       Comments  walk test results          Exercise Comments:   Exercise Goals and Review: Exercise Goals    Row Name 04/15/20 1538             Exercise Goals   Increase Physical Activity  Yes       Intervention  Provide advice, education, support and counseling about physical  activity/exercise needs.;Develop an individualized exercise prescription for aerobic and resistive training based on initial evaluation findings, risk stratification, comorbidities and participant's personal goals.       Expected Outcomes  Short Term: Attend rehab on a regular basis to increase amount of physical activity.;Long Term: Add in home exercise to make exercise part of routine and to increase amount of physical activity.;Long Term: Exercising regularly at least 3-5 days a week.       Increase Strength and Stamina  Yes       Intervention  Provide advice, education, support and counseling about physical activity/exercise needs.;Develop an individualized exercise prescription for aerobic and resistive training based on initial evaluation findings, risk stratification, comorbidities and participant's personal goals.       Expected Outcomes  Short Term: Increase workloads from initial exercise prescription for resistance, speed, and METs.;Short Term: Perform resistance training exercises routinely during rehab and add in resistance training at home;Long Term: Improve cardiorespiratory fitness, muscular endurance and strength as measured by  increased METs and functional capacity (6MWT)       Able to understand and use rate of perceived exertion (RPE) scale  Yes       Intervention  Provide education and explanation on how to use RPE scale       Expected Outcomes  Short Term: Able to use RPE daily in rehab to express subjective intensity level;Long Term:  Able to use RPE to guide intensity level when exercising independently       Able to understand and use Dyspnea scale  Yes       Intervention  Provide education and explanation on how to use Dyspnea scale       Expected Outcomes  Short Term: Able to use Dyspnea scale daily in rehab to express subjective sense of shortness of breath during exertion;Long Term: Able to use Dyspnea scale to guide intensity level when exercising independently       Knowledge and understanding of Target Heart Rate Range (THRR)  Yes       Intervention  Provide education and explanation of THRR including how the numbers were predicted and where they are located for reference       Expected Outcomes  Short Term: Able to state/look up THRR;Short Term: Able to use daily as guideline for intensity in rehab;Long Term: Able to use THRR to govern intensity when exercising independently       Able to check pulse independently  Yes       Intervention  Provide education and demonstration on how to check pulse in carotid and radial arteries.;Review the importance of being able to check your own pulse for safety during independent exercise       Expected Outcomes  Short Term: Able to explain why pulse checking is important during independent exercise;Long Term: Able to check pulse independently and accurately       Understanding of Exercise Prescription  Yes       Intervention  Provide education, explanation, and written materials on patient's individual exercise prescription       Expected Outcomes  Short Term: Able to explain program exercise prescription;Long Term: Able to explain home exercise prescription to exercise  independently          Exercise Goals Re-Evaluation :   Discharge Exercise Prescription (Final Exercise Prescription Changes): Exercise Prescription Changes - 04/15/20 1500      Response to Exercise   Blood Pressure (Admit)  146/72    Blood Pressure (Exercise)  176/88  Blood Pressure (Exit)  142/78    Heart Rate (Admit)  86 bpm    Heart Rate (Exercise)  125 bpm    Heart Rate (Exit)  90 bpm    Oxygen Saturation (Admit)  97 %    Oxygen Saturation (Exercise)  96 %    Rating of Perceived Exertion (Exercise)  11    Perceived Dyspnea (Exercise)  3    Symptoms  SOB, hip pain 5/10, shuffling gait    Comments  walk test results       Nutrition:  Target Goals: Understanding of nutrition guidelines, daily intake of sodium <1510m, cholesterol <2070m calories 30% from fat and 7% or less from saturated fats, daily to have 5 or more servings of fruits and vegetables.  Education: Controlling Sodium/Reading Food Labels -Group verbal and written material supporting the discussion of sodium use in heart healthy nutrition. Review and explanation with models, verbal and written materials for utilization of the food label.   Education: General Nutrition Guidelines/Fats and Fiber: -Group instruction provided by verbal, written material, models and posters to present the general guidelines for heart healthy nutrition. Gives an explanation and review of dietary fats and fiber.   Biometrics: Pre Biometrics - 04/15/20 1538      Pre Biometrics   Height  6' 0.9" (1.852 m)    Weight  298 lb 8 oz (135.4 kg)    BMI (Calculated)  39.48    Single Leg Stand  0 seconds        Nutrition Therapy Plan and Nutrition Goals:   Nutrition Assessments: Nutrition Assessments - 04/15/20 1539      MEDFICTS Scores   Pre Score  38       MEDIFICTS Score Key:          ?70 Need to make dietary changes          40-70 Heart Healthy Diet         ? 40 Therapeutic Level Cholesterol Diet  Nutrition Goals  Re-Evaluation:   Nutrition Goals Discharge (Final Nutrition Goals Re-Evaluation):   Psychosocial: Target Goals: Acknowledge presence or absence of significant depression and/or stress, maximize coping skills, provide positive support system. Participant is able to verbalize types and ability to use techniques and skills needed for reducing stress and depression.   Education: Depression - Provides group verbal and written instruction on the correlation between heart/lung disease and depressed mood, treatment options, and the stigmas associated with seeking treatment.   Education: Sleep Hygiene -Provides group verbal and written instruction about how sleep can affect your health.  Define sleep hygiene, discuss sleep cycles and impact of sleep habits. Review good sleep hygiene tips.     Education: Stress and Anxiety: - Provides group verbal and written instruction about the health risks of elevated stress and causes of high stress.  Discuss the correlation between heart/lung disease and anxiety and treatment options. Review healthy ways to manage with stress and anxiety.    Initial Review & Psychosocial Screening: Initial Psych Review & Screening - 04/10/20 1446      Initial Review   Current issues with  Current Anxiety/Panic;History of Depression;Current Psychotropic Meds;Current Depression;Current Stress Concerns    Source of Stress Concerns  Chronic Illness;Retirement/disability    Comments  On disability and retired, Would like to have more mobility, long history of bipolar disorder but feels well managed currently and denies current symptoms.      Family Dynamics   Good Support System?  Yes   ex-wife, daughter,  and sister (8 brothers and sister) all local     Barriers   Psychosocial barriers to participate in program  The patient should benefit from training in stress management and relaxation.;Psychosocial barriers identified (see note)      Screening Interventions    Interventions  Encouraged to exercise;To provide support and resources with identified psychosocial needs;Provide feedback about the scores to participant    Expected Outcomes  Long Term Goal: Stressors or current issues are controlled or eliminated.;Short Term goal: Utilizing psychosocial counselor, staff and physician to assist with identification of specific Stressors or current issues interfering with healing process. Setting desired goal for each stressor or current issue identified.;Short Term goal: Identification and review with participant of any Quality of Life or Depression concerns found by scoring the questionnaire.;Long Term goal: The participant improves quality of Life and PHQ9 Scores as seen by post scores and/or verbalization of changes       Quality of Life Scores:  Quality of Life - 04/15/20 1539      Quality of Life   Select  Quality of Life      Quality of Life Scores   Health/Function Pre  18 %    Socioeconomic Pre  19 %    Psych/Spiritual Pre  18.86 %    Family Pre  25.5 %    GLOBAL Pre  19.35 %      Scores of 19 and below usually indicate a poorer quality of life in these areas.  A difference of  2-3 points is a clinically meaningful difference.  A difference of 2-3 points in the total score of the Quality of Life Index has been associated with significant improvement in overall quality of life, self-image, physical symptoms, and general health in studies assessing change in quality of life.  PHQ-9: Recent Review Flowsheet Data    Depression screen [employer] 2/9 04/15/2020 10/09/2015   Decreased Interest 0 1   Down, Depressed, Hopeless 0 1   PHQ - 2 Score 0 2   Altered sleeping 0 1   Tired, decreased energy 1 1   Change in appetite 2 2   Feeling bad or failure about yourself  1 1   Trouble concentrating 0 0   Moving slowly or fidgety/restless 0 1   Suicidal thoughts 0 0   PHQ-9 Score 4 8   Difficult doing work/chores Not difficult at all Not difficult at all      Interpretation of Total Score  Total Score Depression Severity:  1-4 = Minimal depression, 5-9 = Mild depression, 10-14 = Moderate depression, 15-19 = Moderately severe depression, 20-27 = Severe depression   Psychosocial Evaluation and Intervention: Psychosocial Evaluation - 04/10/20 1453      Psychosocial Evaluation & Interventions   Interventions  Stress management education;Encouraged to exercise with the program and follow exercise prescription    Comments  Christian [name] is coming into cardiac rehab after a NSTEMI.  He has a long history of bipolar disorder but is medically managed and feels good currently.  He is divorced but has a good relationship with his ex-wife. He also is close to his daughter. They two of them will be sharing the task of getting him to rehab each day.  He is also one of 9 brother and sisters all of which, minus 2, leave within the county.  He is hoping that rehab will supplement his PT/OT and help improve his mobility.  He is also looking foward to getting out to meet people again.  Exercise will help with mood and strengthen for him.    Expected Outcomes  Short: Attend rehab regularly for mood boost and strength Long: Continued compliance on therapy and stable mood.    Continue Psychosocial Services   Follow up required by staff       Psychosocial Re-Evaluation:   Psychosocial Discharge (Final Psychosocial Re-Evaluation):   Vocational Rehabilitation: Provide vocational rehab assistance to qualifying candidates.   Vocational Rehab Evaluation & Intervention: Vocational Rehab - 04/10/20 1446      Initial Vocational Rehab Evaluation & Intervention   Assessment shows need for Vocational Rehabilitation  No   disabled/retired      Education: Education Goals: Education classes will be provided on a variety of topics geared toward better understanding of heart health and risk factor modification. Participant will state understanding/return demonstration of  topics presented as noted by education test scores.  Learning Barriers/Preferences: Learning Barriers/Preferences - 04/10/20 1445      Learning Barriers/Preferences   Learning Barriers  Sight   glasss   Learning Preferences  None       General Cardiac Education Topics:  AED/CPR: - Group verbal and written instruction with the use of models to demonstrate the basic use of the AED with the basic ABC's of resuscitation.   Anatomy & Physiology of the Heart: - Group verbal and written instruction and models provide basic cardiac anatomy and physiology, with the coronary electrical and arterial systems. Review of Valvular disease and Heart Failure   Cardiac Procedures: - Group verbal and written instruction to review commonly prescribed medications for heart disease. Reviews the medication, class of the drug, and side effects. Includes the steps to properly store meds and maintain the prescription regimen. (beta blockers and nitrates)   Cardiac Medications I: - Group verbal and written instruction to review commonly prescribed medications for heart disease. Reviews the medication, class of the drug, and side effects. Includes the steps to properly store meds and maintain the prescription regimen.   Cardiac Medications II: -Group verbal and written instruction to review commonly prescribed medications for heart disease. Reviews the medication, class of the drug, and side effects. (all other drug classes)    Go Sex-Intimacy & Heart Disease, Get SMART - Goal Setting: - Group verbal and written instruction through game format to discuss heart disease and the return to sexual intimacy. Provides group verbal and written material to discuss and apply goal setting through the application of the S.M.A.R.T. Method.   Other Matters of the Heart: - Provides group verbal, written materials and models to describe Stable Angina and Peripheral Artery. Includes description of the disease process and  treatment options available to the cardiac patient.   Infection Prevention: - Provides verbal and written material to individual with discussion of infection control including proper hand washing and proper equipment cleaning during exercise session.   Cardiac Rehab from 04/15/2020 in [facility] Cardiac and Pulmonary Rehab  Date  04/15/20  Educator  [facility]  Instruction Review Code  1- Verbalizes Understanding      Falls Prevention: - Provides verbal and written material to individual with discussion of falls prevention and safety.   Cardiac Rehab from 04/15/2020 in [facility] Cardiac and Pulmonary Rehab  Date  04/15/20  Educator  [facility]  Instruction Review Code  1- Verbalizes Understanding      Other: -Provides group and verbal instruction on various topics (see comments)   Knowledge Questionnaire Score: Knowledge Questionnaire Score - 04/15/20 1539      Knowledge Questionnaire Score  Pre Score  23/26 Education Focus: Angina, Nutrition, Exercise       Core Components/Risk Factors/Patient Goals at Admission: Personal Goals and Risk Factors at Admission - 04/15/20 1540      Core Components/Risk Factors/Patient Goals on Admission    Weight Management  Yes;Weight Loss;Obesity    Intervention  Weight Management: Develop a combined nutrition and exercise program designed to reach desired caloric intake, while maintaining appropriate intake of nutrient and fiber, sodium and fats, and appropriate energy expenditure required for the weight goal.;Weight Management: Provide education and appropriate resources to help participant work on and attain dietary goals.;Weight Management/Obesity: Establish reasonable short term and long term weight goals.;Obesity: Provide education and appropriate resources to help participant work on and attain dietary goals.    Admit Weight  298 lb 8 oz (135.4 kg)    Goal Weight: Short Term  293 lb (132.9 kg)    Goal Weight: Long Term  288 lb (130.6 kg)    Expected Outcomes   Short Term: Continue to assess and modify interventions until short term weight is achieved;Long Term: Adherence to nutrition and physical activity/exercise program aimed toward attainment of established weight goal;Weight Loss: Understanding of general recommendations for a balanced deficit meal plan, which promotes 1-2 lb weight loss per week and includes a negative energy balance of [phone] kcal/d;Understanding recommendations for meals to include 15-35% energy as protein, 25-35% energy from fat, 35-60% energy from carbohydrates, less than 295m of dietary cholesterol, 20-35 gm of total fiber daily;Understanding of distribution of calorie intake throughout the day with the consumption of 4-5 meals/snacks    Diabetes  Yes    Intervention  Provide education about signs/symptoms and action to take for hypo/hyperglycemia.;Provide education about proper nutrition, including hydration, and aerobic/resistive exercise prescription along with prescribed medications to achieve blood glucose in normal ranges: Fasting glucose 65-99 mg/dL    Expected Outcomes  Short Term: Participant verbalizes understanding of the signs/symptoms and immediate care of hyper/hypoglycemia, proper foot care and importance of medication, aerobic/resistive exercise and nutrition plan for blood glucose control.;Long Term: Attainment of HbA1C < 7%.    Heart Failure  Yes    Intervention  Provide a combined exercise and nutrition program that is supplemented with education, support and counseling about heart failure. Directed toward relieving symptoms such as shortness of breath, decreased exercise tolerance, and extremity edema.    Expected Outcomes  Improve functional capacity of life;Short term: Attendance in program 2-3 days a week with increased exercise capacity. Reported lower sodium intake. Reported increased fruit and vegetable intake. Reports medication compliance.;Short term: Daily weights obtained and reported for increase.  Utilizing diuretic protocols set by physician.;Long term: Adoption of self-care skills and reduction of barriers for early signs and symptoms recognition and intervention leading to self-care maintenance.    Hypertension  Yes    Intervention  Provide education on lifestyle modifcations including regular physical activity/exercise, weight management, moderate sodium restriction and increased consumption of fresh fruit, vegetables, and low fat dairy, alcohol moderation, and smoking cessation.;Monitor prescription use compliance.    Expected Outcomes  Long Term: Maintenance of blood pressure at goal levels.;Short Term: Continued assessment and intervention until BP is < 140/938mHG in hypertensive participants. < 130/8063mG in hypertensive participants with diabetes, heart failure or chronic kidney disease.    Lipids  Yes    Intervention  Provide education and support for participant on nutrition & aerobic/resistive exercise along with prescribed medications to achieve LDL <23m15mDL >40mg14m Expected Outcomes  Short Term: Participant states understanding of desired cholesterol values and is compliant with medications prescribed. Participant is following exercise prescription and nutrition guidelines.;Long Term: Cholesterol controlled with medications as prescribed, with individualized exercise RX and with personalized nutrition plan. Value goals: LDL < '70mg'$ , HDL > 40 mg.       Education:Diabetes - Individual verbal and written instruction to review signs/symptoms of diabetes, desired ranges of glucose level fasting, after meals and with exercise. Acknowledge that pre and post exercise glucose checks will be done for 3 sessions at entry of program.   Cardiac Rehab from 04/15/2020 in [facility]  Date  04/15/20  Educator  [facility]  Instruction Review Code  1- Verbalizes Understanding      Education: Know Your Numbers and Risk Factors: -Group verbal and written instruction about  important numbers in your health.  Discussion of what are risk factors and how they play a role in the disease process.  Review of Cholesterol, Blood Pressure, Diabetes, and BMI and the role they play in your overall health.   Core Components/Risk Factors/Patient Goals Review:    Core Components/Risk Factors/Patient Goals at Discharge (Final Review):    ITP Comments: ITP Comments    Row Name 04/10/20 1504 04/15/20 1535         ITP Comments  Completed virtual orientation today.  EP evaluation is scheduled for Tuesday 5/18 at 1pm.  Documentation for diagnosis can be found in [facility] encounter 03/31/20.  Completed 6MWT and gym orientation.  Initial ITP created and sent for review to Dr. Emily Filbert, Medical Director.         Comments: Initial ITP

## 2020-04-15 NOTE — Patient Instructions (Signed)
Patient Instructions  Patient Details  Name: Christian [name] MRN: [id number] Date of Birth: [birth year] Referring Provider:  Laurier Nancy, MD  Below are your personal goals for exercise, nutrition, and risk factors. Our goal is to help you stay on track towards obtaining and maintaining these goals. We will be discussing your progress on these goals with you throughout the program.  Initial Exercise Prescription: Initial Exercise Prescription - 04/15/20 1500      Date of Initial Exercise RX and Referring Provider   Date  04/15/20    Referring Provider  Adrian Blackwater  MD      Treadmill   MPH  1.4    Grade  0.5    Minutes  15    METs  2.17      NuStep   Level  1    SPM  80    Minutes  15    METs  2      T5 Nustep   Level  1    SPM  80    Minutes  15    METs  2      Biostep-RELP   Level  1    SPM  50    Minutes  15    METs  2      Prescription Details   Frequency (times per week)  2    Duration  Progress to 30 minutes of continuous aerobic without signs/symptoms of physical distress      Intensity   THRR 40-80% of Max Heartrate  113-140    Ratings of Perceived Exertion  11-13    Perceived Dyspnea  0-4      Progression   Progression  Continue to progress workloads to maintain intensity without signs/symptoms of physical distress.      Resistance Training   Training Prescription  Yes    Weight  3 lb    Reps  10-15       Exercise Goals: Frequency: Be able to perform aerobic exercise two to three times per week in program working toward 2-5 days per week of home exercise.  Intensity: Work with a perceived exertion of 11 (fairly light) - 15 (hard) while following your exercise prescription.  We will make changes to your prescription with you as you progress through the program.   Duration: Be able to do 30 to 45 minutes of continuous aerobic exercise in addition to a 5 minute warm-up and a 5 minute cool-down routine.   Nutrition Goals: Your personal  nutrition goals will be established when you do your nutrition analysis with the dietician.  The following are general nutrition guidelines to follow: Cholesterol < 200mg /day Sodium < 1500mg /day Fiber: Men over 50 yrs - 30 grams per day  Personal Goals: Personal Goals and Risk Factors at Admission - 04/15/20 1540      Core Components/Risk Factors/Patient Goals on Admission    Weight Management  Yes;Weight Loss;Obesity    Intervention  Weight Management: Develop a combined nutrition and exercise program designed to reach desired caloric intake, while maintaining appropriate intake of nutrient and fiber, sodium and fats, and appropriate energy expenditure required for the weight goal.;Weight Management: Provide education and appropriate resources to help participant work on and attain dietary goals.;Weight Management/Obesity: Establish reasonable short term and long term weight goals.;Obesity: Provide education and appropriate resources to help participant work on and attain dietary goals.    Admit Weight  298 lb 8 oz (135.4 kg)    Goal Weight: Short  Term  293 lb (132.9 kg)    Goal Weight: Long Term  288 lb (130.6 kg)    Expected Outcomes  Short Term: Continue to assess and modify interventions until short term weight is achieved;Long Term: Adherence to nutrition and physical activity/exercise program aimed toward attainment of established weight goal;Weight Loss: Understanding of general recommendations for a balanced deficit meal plan, which promotes 1-2 lb weight loss per week and includes a negative energy balance of [phone] kcal/d;Understanding recommendations for meals to include 15-35% energy as protein, 25-35% energy from fat, 35-60% energy from carbohydrates, less than 200mg  of dietary cholesterol, 20-35 gm of total fiber daily;Understanding of distribution of calorie intake throughout the day with the consumption of 4-5 meals/snacks    Diabetes  Yes    Intervention  Provide education about  signs/symptoms and action to take for hypo/hyperglycemia.;Provide education about proper nutrition, including hydration, and aerobic/resistive exercise prescription along with prescribed medications to achieve blood glucose in normal ranges: Fasting glucose 65-99 mg/dL    Expected Outcomes  Short Term: Participant verbalizes understanding of the signs/symptoms and immediate care of hyper/hypoglycemia, proper foot care and importance of medication, aerobic/resistive exercise and nutrition plan for blood glucose control.;Long Term: Attainment of HbA1C < 7%.    Heart Failure  Yes    Intervention  Provide a combined exercise and nutrition program that is supplemented with education, support and counseling about heart failure. Directed toward relieving symptoms such as shortness of breath, decreased exercise tolerance, and extremity edema.    Expected Outcomes  Improve functional capacity of life;Short term: Attendance in program 2-3 days a week with increased exercise capacity. Reported lower sodium intake. Reported increased fruit and vegetable intake. Reports medication compliance.;Short term: Daily weights obtained and reported for increase. Utilizing diuretic protocols set by physician.;Long term: Adoption of self-care skills and reduction of barriers for early signs and symptoms recognition and intervention leading to self-care maintenance.    Hypertension  Yes    Intervention  Provide education on lifestyle modifcations including regular physical activity/exercise, weight management, moderate sodium restriction and increased consumption of fresh fruit, vegetables, and low fat dairy, alcohol moderation, and smoking cessation.;Monitor prescription use compliance.    Expected Outcomes  Long Term: Maintenance of blood pressure at goal levels.;Short Term: Continued assessment and intervention until BP is < 140/72mm HG in hypertensive participants. < 130/56mm HG in hypertensive participants with diabetes, heart  failure or chronic kidney disease.    Lipids  Yes    Intervention  Provide education and support for participant on nutrition & aerobic/resistive exercise along with prescribed medications to achieve LDL 70mg , HDL >40mg .    Expected Outcomes  Short Term: Participant states understanding of desired cholesterol values and is compliant with medications prescribed. Participant is following exercise prescription and nutrition guidelines.;Long Term: Cholesterol controlled with medications as prescribed, with individualized exercise RX and with personalized nutrition plan. Value goals: LDL < 70mg , HDL > 40 mg.       Tobacco Use Initial Evaluation: Social History   Tobacco Use  Smoking Status Never Smoker  Smokeless Tobacco Never Used    Exercise Goals and Review: Exercise Goals    Row Name 04/15/20 1538             Exercise Goals   Increase Physical Activity  Yes       Intervention  Provide advice, education, support and counseling about physical activity/exercise needs.;Develop an individualized exercise prescription for aerobic and resistive training based on initial evaluation findings, risk stratification, comorbidities  and participant's personal goals.       Expected Outcomes  Short Term: Attend rehab on a regular basis to increase amount of physical activity.;Long Term: Add in home exercise to make exercise part of routine and to increase amount of physical activity.;Long Term: Exercising regularly at least 3-5 days a week.       Increase Strength and Stamina  Yes       Intervention  Provide advice, education, support and counseling about physical activity/exercise needs.;Develop an individualized exercise prescription for aerobic and resistive training based on initial evaluation findings, risk stratification, comorbidities and participant's personal goals.       Expected Outcomes  Short Term: Increase workloads from initial exercise prescription for resistance, speed, and METs.;Short  Term: Perform resistance training exercises routinely during rehab and add in resistance training at home;Long Term: Improve cardiorespiratory fitness, muscular endurance and strength as measured by increased METs and functional capacity (6MWT)       Able to understand and use rate of perceived exertion (RPE) scale  Yes       Intervention  Provide education and explanation on how to use RPE scale       Expected Outcomes  Short Term: Able to use RPE daily in rehab to express subjective intensity level;Long Term:  Able to use RPE to guide intensity level when exercising independently       Able to understand and use Dyspnea scale  Yes       Intervention  Provide education and explanation on how to use Dyspnea scale       Expected Outcomes  Short Term: Able to use Dyspnea scale daily in rehab to express subjective sense of shortness of breath during exertion;Long Term: Able to use Dyspnea scale to guide intensity level when exercising independently       Knowledge and understanding of Target Heart Rate Range (THRR)  Yes       Intervention  Provide education and explanation of THRR including how the numbers were predicted and where they are located for reference       Expected Outcomes  Short Term: Able to state/look up THRR;Short Term: Able to use daily as guideline for intensity in rehab;Long Term: Able to use THRR to govern intensity when exercising independently       Able to check pulse independently  Yes       Intervention  Provide education and demonstration on how to check pulse in carotid and radial arteries.;Review the importance of being able to check your own pulse for safety during independent exercise       Expected Outcomes  Short Term: Able to explain why pulse checking is important during independent exercise;Long Term: Able to check pulse independently and accurately       Understanding of Exercise Prescription  Yes       Intervention  Provide education, explanation, and written materials  on patient's individual exercise prescription       Expected Outcomes  Short Term: Able to explain program exercise prescription;Long Term: Able to explain home exercise prescription to exercise independently          Copy of goals given to participant.

## 2020-04-16 ENCOUNTER — Encounter: Payer: Self-pay | Admitting: *Deleted

## 2020-04-16 DIAGNOSIS — I214 Non-ST elevation (NSTEMI) myocardial infarction: Secondary | ICD-10-CM

## 2020-04-16 NOTE — Progress Notes (Signed)
Cardiac Individual Treatment Plan  Patient Details  Name: Christian [name] MRN: [id number] Date of Birth: 12/24/1950 Referring Provider:     Cardiac Rehab from 04/15/2020 in [facility] Cardiac and Pulmonary Rehab  Referring Provider  Neoma Laming  MD      Initial Encounter Date:    Cardiac Rehab from 04/15/2020 in [facility] Cardiac and Pulmonary Rehab  Date  04/15/20      Visit Diagnosis: NSTEMI (non-ST elevated myocardial infarction) [facility])  Patient's Home Medications on Admission:  Current Outpatient Medications:  .  ACETAMINOPHEN EXTRA STRENGTH 500 MG tablet, Take 1,000 mg by mouth 2 (two) times daily., Disp: , Rfl:  .  amLODipine (NORVASC) 10 MG tablet, Take 1 tablet (10 mg total) by mouth daily., Disp: 30 tablet, Rfl: 0 .  ARIPiprazole (ABILIFY) 20 MG tablet, Take 1 tablet (20 mg total) by mouth daily., Disp: 30 tablet, Rfl: 0 .  aspirin EC 81 MG EC tablet, Take 1 tablet (81 mg total) by mouth daily., Disp: 30 tablet, Rfl: 0 .  atorvastatin (LIPITOR) 10 MG tablet, Take 1 tablet (10 mg total) by mouth daily at 6 PM. (Patient taking differently: Take 80 mg by mouth daily at 6 PM. ), Disp: 30 tablet, Rfl: 0 .  buPROPion (WELLBUTRIN) 75 MG tablet, Take 1 tablet (75 mg total) by mouth daily., Disp: 30 tablet, Rfl: 0 .  divalproex (DEPAKOTE) 500 MG DR tablet, Take 1 tablet (500 mg total) by mouth every 12 (twelve) hours., Disp: 60 tablet, Rfl: 0 .  furosemide (LASIX) 20 MG tablet, Take 1 tablet (20 mg total) by mouth daily., Disp: 30 tablet, Rfl: 0 .  glimepiride (AMARYL) 2 MG tablet, Take 2 mg by mouth daily with breakfast., Disp: , Rfl:  .  liothyronine (CYTOMEL) 25 MCG tablet, Take 25 mcg by mouth daily before breakfast. , Disp: , Rfl:  .  lisinopril (ZESTRIL) 40 MG tablet, Take 1 tablet (40 mg total) by mouth daily., Disp: 30 tablet, Rfl: 0 .  metoprolol succinate (TOPROL-XL) 50 MG 24 hr tablet, Take 1 tablet (50 mg total) by mouth daily. Take with or immediately following a meal., Disp: 60  tablet, Rfl: 3 .  mirtazapine (REMERON SOL-TAB) 15 MG disintegrating tablet, Take 1 tablet (15 mg total) by mouth at bedtime., Disp: 30 tablet, Rfl: 0 .  QUEtiapine (SEROQUEL) 25 MG tablet, Take 25 mg by mouth 2 (two) times daily. , Disp: , Rfl:  .  QUEtiapine (SEROQUEL) 300 MG tablet, Take 300 mg by mouth at bedtime., Disp: , Rfl:  .  rivaroxaban (XARELTO) 20 MG TABS tablet, Take 1 tablet (20 mg total) by mouth daily with supper., Disp: 30 tablet, Rfl: 0 .  sitaGLIPtin (JANUVIA) 25 MG tablet, Take 25 mg by mouth daily., Disp: , Rfl:  No current facility-administered medications for this visit.  Facility-Administered Medications Ordered in Other Visits:  .  ondansetron (ZOFRAN) injection 4 mg, 4 mg, Intravenous, Once PRN, Ronelle Nigh Jamse Mead, MD  Past Medical History: Past Medical History:  Diagnosis Date  . Bilateral lower extremity edema: chronic with venous stasis changes 06/02/2014  . Bipolar 1 disorder ([place])   . Bipolar disorder ([place])   . Chronic kidney disease   . Gout   . Hx of blood clots   . Hypertension   . Mood swings   . OSA on CPAP 06/02/2014  . Other and unspecified hyperlipidemia 06/02/2014  . Type II or unspecified type diabetes mellitus with unspecified complication, uncontrolled 06/02/2014    Tobacco Use:  Social History   Tobacco Use  Smoking Status Never Smoker  Smokeless Tobacco Never Used    Labs: Recent Review Flowsheet Data    Labs for ITP Cardiac and Pulmonary Rehab Latest Ref Rng & Units 09/01/2015 12/28/2015 01/30/2020 03/17/2020 03/31/2020   Cholestrol 0 - 200 mg/dL - 108 - - -   LDLCALC 0 - 99 mg/dL - 63 - - -   HDL >40 mg/dL - 33(L) - - -   Trlycerides <150 mg/dL - 62 - - -   Hemoglobin A1c 4.8 - 5.6 % 7.5(H) - 7.1(H) - 8.7(H)   PHART 7.350 - 7.450 7.51(H) - - 7.40 -   PCO2ART 32.0 - 48.0 mmHg 27(L) - - 41 -   HCO3 20.0 - 28.0 mmol/L 21.5 - - 25.4 -   TCO2 0 - 100 mmol/L - - - - -   ACIDBASEDEF 0.0 - 2.0 mmol/L 0.3 - - - -   O2SAT % 98.2 - - 90.6 -        Exercise Target Goals: Exercise Program Goal: Individual exercise prescription set using results from initial 6 min walk test and THRR while considering  patient's activity barriers and safety.   Exercise Prescription Goal: Initial exercise prescription builds to 30-45 minutes a day of aerobic activity, 2-3 days per week.  Home exercise guidelines will be given to patient during program as part of exercise prescription that the participant will acknowledge.   Education: Aerobic Exercise & Resistance Training: - Gives group verbal and written instruction on the various components of exercise. Focuses on aerobic and resistive training programs and the benefits of this training and how to safely progress through these programs..   Education: Exercise & Equipment Safety: - Individual verbal instruction and demonstration of equipment use and safety with use of the equipment.   Cardiac Rehab from 04/15/2020 in [facility] Cardiac and Pulmonary Rehab  Date  04/15/20  Educator  [facility]  Instruction Review Code  1- Verbalizes Understanding      Education: Exercise Physiology & General Exercise Guidelines: - Group verbal and written instruction with models to review the exercise physiology of the cardiovascular system and associated critical values. Provides general exercise guidelines with specific guidelines to those with heart or lung disease.    Education: Flexibility, Balance, Mind/Body Relaxation: Provides group verbal/written instruction on the benefits of flexibility and balance training, including mind/body exercise modes such as yoga, pilates and tai chi.  Demonstration and skill practice provided.   Activity Barriers & Risk Stratification: Activity Barriers & Cardiac Risk Stratification - 04/15/20 1536      Activity Barriers & Cardiac Risk Stratification   Activity Barriers  Balance Concerns;Assistive Device;Decreased Ventricular Function;Deconditioning;Muscular Weakness;Back  Problems;Other (comment);Joint Problems    Comments  occasional back pain, torn rotator cuff (bilateral)    Cardiac Risk Stratification  High       6 Minute Walk: 6 Minute Walk    Row Name 04/15/20 1535         6 Minute Walk   Distance  847 feet     Walk Time  6 minutes     # of Rest Breaks  0     MPH  1.6     METS  2.26     RPE  11     Perceived Dyspnea   3     VO2 Peak  7.89     Symptoms  Yes (comment)     Comments  SOB, hip pain 5/10  Resting HR  86 bpm     Resting BP  146/72     Resting Oxygen Saturation   97 %     Exercise Oxygen Saturation  during 6 min walk  96 %     Max Ex. HR  125 bpm     Max Ex. BP  176/88     2 Minute Post BP  142/78        Oxygen Initial Assessment:   Oxygen Re-Evaluation:   Oxygen Discharge (Final Oxygen Re-Evaluation):   Initial Exercise Prescription: Initial Exercise Prescription - 04/15/20 1500      Date of Initial Exercise RX and Referring Provider   Date  04/15/20    Referring Provider  Neoma Laming  MD      Treadmill   MPH  1.4    Grade  0.5    Minutes  15    METs  2.17      NuStep   Level  1    SPM  80    Minutes  15    METs  2      T5 Nustep   Level  1    SPM  80    Minutes  15    METs  2      Biostep-RELP   Level  1    SPM  50    Minutes  15    METs  2      Prescription Details   Frequency (times per week)  2    Duration  Progress to 30 minutes of continuous aerobic without signs/symptoms of physical distress      Intensity   THRR 40-80% of Max Heartrate  113-140    Ratings of Perceived Exertion  11-13    Perceived Dyspnea  0-4      Progression   Progression  Continue to progress workloads to maintain intensity without signs/symptoms of physical distress.      Resistance Training   Training Prescription  Yes    Weight  3 lb    Reps  10-15       Perform Capillary Blood Glucose checks as needed.  Exercise Prescription Changes: Exercise Prescription Changes    Row Name 04/15/20 1500              Response to Exercise   Blood Pressure (Admit)  146/72       Blood Pressure (Exercise)  176/88       Blood Pressure (Exit)  142/78       Heart Rate (Admit)  86 bpm       Heart Rate (Exercise)  125 bpm       Heart Rate (Exit)  90 bpm       Oxygen Saturation (Admit)  97 %       Oxygen Saturation (Exercise)  96 %       Rating of Perceived Exertion (Exercise)  11       Perceived Dyspnea (Exercise)  3       Symptoms  SOB, hip pain 5/10, shuffling gait       Comments  walk test results          Exercise Comments:   Exercise Goals and Review: Exercise Goals    Row Name 04/15/20 1538             Exercise Goals   Increase Physical Activity  Yes       Intervention  Provide advice, education, support and counseling about physical  activity/exercise needs.;Develop an individualized exercise prescription for aerobic and resistive training based on initial evaluation findings, risk stratification, comorbidities and participant's personal goals.       Expected Outcomes  Short Term: Attend rehab on a regular basis to increase amount of physical activity.;Long Term: Add in home exercise to make exercise part of routine and to increase amount of physical activity.;Long Term: Exercising regularly at least 3-5 days a week.       Increase Strength and Stamina  Yes       Intervention  Provide advice, education, support and counseling about physical activity/exercise needs.;Develop an individualized exercise prescription for aerobic and resistive training based on initial evaluation findings, risk stratification, comorbidities and participant's personal goals.       Expected Outcomes  Short Term: Increase workloads from initial exercise prescription for resistance, speed, and METs.;Short Term: Perform resistance training exercises routinely during rehab and add in resistance training at home;Long Term: Improve cardiorespiratory fitness, muscular endurance and strength as measured by  increased METs and functional capacity (6MWT)       Able to understand and use rate of perceived exertion (RPE) scale  Yes       Intervention  Provide education and explanation on how to use RPE scale       Expected Outcomes  Short Term: Able to use RPE daily in rehab to express subjective intensity level;Long Term:  Able to use RPE to guide intensity level when exercising independently       Able to understand and use Dyspnea scale  Yes       Intervention  Provide education and explanation on how to use Dyspnea scale       Expected Outcomes  Short Term: Able to use Dyspnea scale daily in rehab to express subjective sense of shortness of breath during exertion;Long Term: Able to use Dyspnea scale to guide intensity level when exercising independently       Knowledge and understanding of Target Heart Rate Range (THRR)  Yes       Intervention  Provide education and explanation of THRR including how the numbers were predicted and where they are located for reference       Expected Outcomes  Short Term: Able to state/look up THRR;Short Term: Able to use daily as guideline for intensity in rehab;Long Term: Able to use THRR to govern intensity when exercising independently       Able to check pulse independently  Yes       Intervention  Provide education and demonstration on how to check pulse in carotid and radial arteries.;Review the importance of being able to check your own pulse for safety during independent exercise       Expected Outcomes  Short Term: Able to explain why pulse checking is important during independent exercise;Long Term: Able to check pulse independently and accurately       Understanding of Exercise Prescription  Yes       Intervention  Provide education, explanation, and written materials on patient's individual exercise prescription       Expected Outcomes  Short Term: Able to explain program exercise prescription;Long Term: Able to explain home exercise prescription to exercise  independently          Exercise Goals Re-Evaluation :   Discharge Exercise Prescription (Final Exercise Prescription Changes): Exercise Prescription Changes - 04/15/20 1500      Response to Exercise   Blood Pressure (Admit)  146/72    Blood Pressure (Exercise)  176/88  Blood Pressure (Exit)  142/78    Heart Rate (Admit)  86 bpm    Heart Rate (Exercise)  125 bpm    Heart Rate (Exit)  90 bpm    Oxygen Saturation (Admit)  97 %    Oxygen Saturation (Exercise)  96 %    Rating of Perceived Exertion (Exercise)  11    Perceived Dyspnea (Exercise)  3    Symptoms  SOB, hip pain 5/10, shuffling gait    Comments  walk test results       Nutrition:  Target Goals: Understanding of nutrition guidelines, daily intake of sodium <1510m, cholesterol <2070m calories 30% from fat and 7% or less from saturated fats, daily to have 5 or more servings of fruits and vegetables.  Education: Controlling Sodium/Reading Food Labels -Group verbal and written material supporting the discussion of sodium use in heart healthy nutrition. Review and explanation with models, verbal and written materials for utilization of the food label.   Education: General Nutrition Guidelines/Fats and Fiber: -Group instruction provided by verbal, written material, models and posters to present the general guidelines for heart healthy nutrition. Gives an explanation and review of dietary fats and fiber.   Biometrics: Pre Biometrics - 04/15/20 1538      Pre Biometrics   Height  6' 0.9" (1.852 m)    Weight  298 lb 8 oz (135.4 kg)    BMI (Calculated)  39.48    Single Leg Stand  0 seconds        Nutrition Therapy Plan and Nutrition Goals:   Nutrition Assessments: Nutrition Assessments - 04/15/20 1539      MEDFICTS Scores   Pre Score  38       MEDIFICTS Score Key:          ?70 Need to make dietary changes          40-70 Heart Healthy Diet         ? 40 Therapeutic Level Cholesterol Diet  Nutrition Goals  Re-Evaluation:   Nutrition Goals Discharge (Final Nutrition Goals Re-Evaluation):   Psychosocial: Target Goals: Acknowledge presence or absence of significant depression and/or stress, maximize coping skills, provide positive support system. Participant is able to verbalize types and ability to use techniques and skills needed for reducing stress and depression.   Education: Depression - Provides group verbal and written instruction on the correlation between heart/lung disease and depressed mood, treatment options, and the stigmas associated with seeking treatment.   Education: Sleep Hygiene -Provides group verbal and written instruction about how sleep can affect your health.  Define sleep hygiene, discuss sleep cycles and impact of sleep habits. Review good sleep hygiene tips.     Education: Stress and Anxiety: - Provides group verbal and written instruction about the health risks of elevated stress and causes of high stress.  Discuss the correlation between heart/lung disease and anxiety and treatment options. Review healthy ways to manage with stress and anxiety.    Initial Review & Psychosocial Screening: Initial Psych Review & Screening - 04/10/20 1446      Initial Review   Current issues with  Current Anxiety/Panic;History of Depression;Current Psychotropic Meds;Current Depression;Current Stress Concerns    Source of Stress Concerns  Chronic Illness;Retirement/disability    Comments  On disability and retired, Would like to have more mobility, long history of bipolar disorder but feels well managed currently and denies current symptoms.      Family Dynamics   Good Support System?  Yes   ex-wife, daughter,  and sister (8 brothers and sister) all local     Barriers   Psychosocial barriers to participate in program  The patient should benefit from training in stress management and relaxation.;Psychosocial barriers identified (see note)      Screening Interventions    Interventions  Encouraged to exercise;To provide support and resources with identified psychosocial needs;Provide feedback about the scores to participant    Expected Outcomes  Long Term Goal: Stressors or current issues are controlled or eliminated.;Short Term goal: Utilizing psychosocial counselor, staff and physician to assist with identification of specific Stressors or current issues interfering with healing process. Setting desired goal for each stressor or current issue identified.;Short Term goal: Identification and review with participant of any Quality of Life or Depression concerns found by scoring the questionnaire.;Long Term goal: The participant improves quality of Life and PHQ9 Scores as seen by post scores and/or verbalization of changes       Quality of Life Scores:  Quality of Life - 04/15/20 1539      Quality of Life   Select  Quality of Life      Quality of Life Scores   Health/Function Pre  18 %    Socioeconomic Pre  19 %    Psych/Spiritual Pre  18.86 %    Family Pre  25.5 %    GLOBAL Pre  19.35 %      Scores of 19 and below usually indicate a poorer quality of life in these areas.  A difference of  2-3 points is a clinically meaningful difference.  A difference of 2-3 points in the total score of the Quality of Life Index has been associated with significant improvement in overall quality of life, self-image, physical symptoms, and general health in studies assessing change in quality of life.  PHQ-9: Recent Review Flowsheet Data    Depression screen [employer] 2/9 04/15/2020 10/09/2015   Decreased Interest 0 1   Down, Depressed, Hopeless 0 1   PHQ - 2 Score 0 2   Altered sleeping 0 1   Tired, decreased energy 1 1   Change in appetite 2 2   Feeling bad or failure about yourself  1 1   Trouble concentrating 0 0   Moving slowly or fidgety/restless 0 1   Suicidal thoughts 0 0   PHQ-9 Score 4 8   Difficult doing work/chores Not difficult at all Not difficult at all      Interpretation of Total Score  Total Score Depression Severity:  1-4 = Minimal depression, 5-9 = Mild depression, 10-14 = Moderate depression, 15-19 = Moderately severe depression, 20-27 = Severe depression   Psychosocial Evaluation and Intervention: Psychosocial Evaluation - 04/10/20 1453      Psychosocial Evaluation & Interventions   Interventions  Stress management education;Encouraged to exercise with the program and follow exercise prescription    Comments  Terre is coming into cardiac rehab after a NSTEMI.  He has a long history of bipolar disorder but is medically managed and feels good currently.  He is divorced but has a good relationship with his ex-wife. He also is close to his daughter. They two of them will be sharing the task of getting him to rehab each day.  He is also one of 9 brother and sisters all of which, minus 2, leave within the county.  He is hoping that rehab will supplement his PT/OT and help improve his mobility.  He is also looking foward to getting out to meet people again.  Exercise will help with mood and strengthen for him.    Expected Outcomes  Short: Attend rehab regularly for mood boost and strength Long: Continued compliance on therapy and stable mood.    Continue Psychosocial Services   Follow up required by staff       Psychosocial Re-Evaluation:   Psychosocial Discharge (Final Psychosocial Re-Evaluation):   Vocational Rehabilitation: Provide vocational rehab assistance to qualifying candidates.   Vocational Rehab Evaluation & Intervention: Vocational Rehab - 04/10/20 1446      Initial Vocational Rehab Evaluation & Intervention   Assessment shows need for Vocational Rehabilitation  No   disabled/retired      Education: Education Goals: Education classes will be provided on a variety of topics geared toward better understanding of heart health and risk factor modification. Participant will state understanding/return demonstration of  topics presented as noted by education test scores.  Learning Barriers/Preferences: Learning Barriers/Preferences - 04/10/20 1445      Learning Barriers/Preferences   Learning Barriers  Sight   glasss   Learning Preferences  None       General Cardiac Education Topics:  AED/CPR: - Group verbal and written instruction with the use of models to demonstrate the basic use of the AED with the basic ABC's of resuscitation.   Anatomy & Physiology of the Heart: - Group verbal and written instruction and models provide basic cardiac anatomy and physiology, with the coronary electrical and arterial systems. Review of Valvular disease and Heart Failure   Cardiac Procedures: - Group verbal and written instruction to review commonly prescribed medications for heart disease. Reviews the medication, class of the drug, and side effects. Includes the steps to properly store meds and maintain the prescription regimen. (beta blockers and nitrates)   Cardiac Medications I: - Group verbal and written instruction to review commonly prescribed medications for heart disease. Reviews the medication, class of the drug, and side effects. Includes the steps to properly store meds and maintain the prescription regimen.   Cardiac Medications II: -Group verbal and written instruction to review commonly prescribed medications for heart disease. Reviews the medication, class of the drug, and side effects. (all other drug classes)    Go Sex-Intimacy & Heart Disease, Get SMART - Goal Setting: - Group verbal and written instruction through game format to discuss heart disease and the return to sexual intimacy. Provides group verbal and written material to discuss and apply goal setting through the application of the S.M.A.R.T. Method.   Other Matters of the Heart: - Provides group verbal, written materials and models to describe Stable Angina and Peripheral Artery. Includes description of the disease process and  treatment options available to the cardiac patient.   Infection Prevention: - Provides verbal and written material to individual with discussion of infection control including proper hand washing and proper equipment cleaning during exercise session.   Cardiac Rehab from 04/15/2020 in [facility] Cardiac and Pulmonary Rehab  Date  04/15/20  Educator  [facility]  Instruction Review Code  1- Verbalizes Understanding      Falls Prevention: - Provides verbal and written material to individual with discussion of falls prevention and safety.   Cardiac Rehab from 04/15/2020 in [facility] Cardiac and Pulmonary Rehab  Date  04/15/20  Educator  [facility]  Instruction Review Code  1- Verbalizes Understanding      Other: -Provides group and verbal instruction on various topics (see comments)   Knowledge Questionnaire Score: Knowledge Questionnaire Score - 04/15/20 1539      Knowledge Questionnaire Score  Pre Score  23/26 Education Focus: Angina, Nutrition, Exercise       Core Components/Risk Factors/Patient Goals at Admission: Personal Goals and Risk Factors at Admission - 04/15/20 1540      Core Components/Risk Factors/Patient Goals on Admission    Weight Management  Yes;Weight Loss;Obesity    Intervention  Weight Management: Develop a combined nutrition and exercise program designed to reach desired caloric intake, while maintaining appropriate intake of nutrient and fiber, sodium and fats, and appropriate energy expenditure required for the weight goal.;Weight Management: Provide education and appropriate resources to help participant work on and attain dietary goals.;Weight Management/Obesity: Establish reasonable short term and long term weight goals.;Obesity: Provide education and appropriate resources to help participant work on and attain dietary goals.    Admit Weight  298 lb 8 oz (135.4 kg)    Goal Weight: Short Term  293 lb (132.9 kg)    Goal Weight: Long Term  288 lb (130.6 kg)    Expected Outcomes   Short Term: Continue to assess and modify interventions until short term weight is achieved;Long Term: Adherence to nutrition and physical activity/exercise program aimed toward attainment of established weight goal;Weight Loss: Understanding of general recommendations for a balanced deficit meal plan, which promotes 1-2 lb weight loss per week and includes a negative energy balance of [phone] kcal/d;Understanding recommendations for meals to include 15-35% energy as protein, 25-35% energy from fat, 35-60% energy from carbohydrates, less than 295m of dietary cholesterol, 20-35 gm of total fiber daily;Understanding of distribution of calorie intake throughout the day with the consumption of 4-5 meals/snacks    Diabetes  Yes    Intervention  Provide education about signs/symptoms and action to take for hypo/hyperglycemia.;Provide education about proper nutrition, including hydration, and aerobic/resistive exercise prescription along with prescribed medications to achieve blood glucose in normal ranges: Fasting glucose 65-99 mg/dL    Expected Outcomes  Short Term: Participant verbalizes understanding of the signs/symptoms and immediate care of hyper/hypoglycemia, proper foot care and importance of medication, aerobic/resistive exercise and nutrition plan for blood glucose control.;Long Term: Attainment of HbA1C < 7%.    Heart Failure  Yes    Intervention  Provide a combined exercise and nutrition program that is supplemented with education, support and counseling about heart failure. Directed toward relieving symptoms such as shortness of breath, decreased exercise tolerance, and extremity edema.    Expected Outcomes  Improve functional capacity of life;Short term: Attendance in program 2-3 days a week with increased exercise capacity. Reported lower sodium intake. Reported increased fruit and vegetable intake. Reports medication compliance.;Short term: Daily weights obtained and reported for increase.  Utilizing diuretic protocols set by physician.;Long term: Adoption of self-care skills and reduction of barriers for early signs and symptoms recognition and intervention leading to self-care maintenance.    Hypertension  Yes    Intervention  Provide education on lifestyle modifcations including regular physical activity/exercise, weight management, moderate sodium restriction and increased consumption of fresh fruit, vegetables, and low fat dairy, alcohol moderation, and smoking cessation.;Monitor prescription use compliance.    Expected Outcomes  Long Term: Maintenance of blood pressure at goal levels.;Short Term: Continued assessment and intervention until BP is < 140/938mHG in hypertensive participants. < 130/8063mG in hypertensive participants with diabetes, heart failure or chronic kidney disease.    Lipids  Yes    Intervention  Provide education and support for participant on nutrition & aerobic/resistive exercise along with prescribed medications to achieve LDL <23m15mDL >40mg14m Expected Outcomes  Short Term: Participant states understanding of desired cholesterol values and is compliant with medications prescribed. Participant is following exercise prescription and nutrition guidelines.;Long Term: Cholesterol controlled with medications as prescribed, with individualized exercise RX and with personalized nutrition plan. Value goals: LDL < 22m, HDL > 40 mg.       Education:Diabetes - Individual verbal and written instruction to review signs/symptoms of diabetes, desired ranges of glucose level fasting, after meals and with exercise. Acknowledge that pre and post exercise glucose checks will be done for 3 sessions at entry of program.   [facility]  Date  04/15/20  [facility]  [facility] Code  1- Verbalizes Understanding      Education: Know Your Numbers and Risk Factors: -Group verbal and written instruction about  important numbers in your health.  Discussion of what are risk factors and how they play a role in the disease process.  Review of Cholesterol, Blood Pressure, Diabetes, and BMI and the role they play in your overall health.   Core Components/Risk Factors/Patient Goals Review:    Core Components/Risk Factors/Patient Goals at Discharge (Final Review):    ITP Comments: ITP Comments    Row Name 04/10/20 1504 04/15/20 1535 04/16/20 0846       ITP Comments  Completed virtual orientation today.  EP evaluation is scheduled for Tuesday 5/18 at 1pm.  [facility].  Completed 6MWT and gym orientation.  Initial ITP created and sent for review to Dr. MEmily Filbert Medical Director.  30 Day review completed. Medical Director review done, changes made as directed,and approval shown by signature of [employer] researcher        Comments:

## 2020-04-18 DIAGNOSIS — G4733 Obstructive sleep apnea (adult) (pediatric): Secondary | ICD-10-CM | POA: Diagnosis not present

## 2020-04-22 ENCOUNTER — Encounter: Payer: Medicare PPO | Admitting: *Deleted

## 2020-04-22 ENCOUNTER — Other Ambulatory Visit: Payer: Self-pay

## 2020-04-22 DIAGNOSIS — N189 Chronic kidney disease, unspecified: Secondary | ICD-10-CM | POA: Diagnosis not present

## 2020-04-22 DIAGNOSIS — Z7982 Long term (current) use of aspirin: Secondary | ICD-10-CM | POA: Diagnosis not present

## 2020-04-22 DIAGNOSIS — Z7901 Long term (current) use of anticoagulants: Secondary | ICD-10-CM | POA: Diagnosis not present

## 2020-04-22 DIAGNOSIS — Z79899 Other long term (current) drug therapy: Secondary | ICD-10-CM | POA: Diagnosis not present

## 2020-04-22 DIAGNOSIS — F319 Bipolar disorder, unspecified: Secondary | ICD-10-CM | POA: Diagnosis not present

## 2020-04-22 DIAGNOSIS — I214 Non-ST elevation (NSTEMI) myocardial infarction: Secondary | ICD-10-CM

## 2020-04-22 DIAGNOSIS — I129 Hypertensive chronic kidney disease with stage 1 through stage 4 chronic kidney disease, or unspecified chronic kidney disease: Secondary | ICD-10-CM | POA: Diagnosis not present

## 2020-04-22 DIAGNOSIS — Z79890 Hormone replacement therapy (postmenopausal): Secondary | ICD-10-CM | POA: Diagnosis not present

## 2020-04-22 DIAGNOSIS — Z7984 Long term (current) use of oral hypoglycemic drugs: Secondary | ICD-10-CM | POA: Diagnosis not present

## 2020-04-22 LAB — GLUCOSE, CAPILLARY
Glucose-Capillary: 142 mg/dL — ABNORMAL HIGH (ref 70–99)
Glucose-Capillary: 122 mg/dL — ABNORMAL HIGH (ref 70–99)

## 2020-04-22 NOTE — Progress Notes (Signed)
Daily Session Note  Patient Details  Name: Christian [name] MRN: [id number] Date of Birth: 04/15/1951 Referring Provider:     Cardiac Rehab from 04/15/2020 in [facility]  Referring Provider  Khan, Shaukat  MD      Encounter Date: 04/22/2020  Check In: Session Check In - 04/22/20 1017      Check-In   Supervising physician immediately available to respond to emergencies  See telemetry face sheet for immediately available ER MD    Location  [facility]    Staff Present  Joseph Hood RCP,RRT,BSRT;Amanda Sommer, BA, ACSM CEP, Exercise Physiologist; , RN, BSN, CCRP    Virtual Visit  No    Medication changes reported      No    Fall or balance concerns reported     No    Warm-up and Cool-down  Performed on first and last piece of equipment    Resistance Training Performed  Yes    VAD Patient?  No    PAD/SET Patient?  No      Pain Assessment   Currently in Pain?  No/denies          Social History   Tobacco Use  Smoking Status Never Smoker  Smokeless Tobacco Never Used    Goals Met:  Exercise tolerated well Personal goals reviewed No report of cardiac concerns or symptoms  Goals Unmet:  Not Applicable  Comments: First full day of exercise!  Patient was oriented to gym and equipment including functions, settings, policies, and procedures.  Patient's individual exercise prescription and treatment plan were reviewed.  All starting workloads were established based on the results of the 6 minute walk test done at initial orientation visit.  The plan for exercise progression was also introduced and progression will be customized based on patient's performance and goals.    Dr. Mark Miller is Medical Director for [facility] and [name]. 

## 2020-04-23 DIAGNOSIS — G4733 Obstructive sleep apnea (adult) (pediatric): Secondary | ICD-10-CM | POA: Diagnosis not present

## 2020-04-28 ENCOUNTER — Ambulatory Visit (HOSPITAL_COMMUNITY)
Admission: EM | Admit: 2020-04-28 | Discharge: 2020-04-28 | Disposition: A | Discharge status: Home / Self Care | Payer: Medicare PPO

## 2020-04-28 ENCOUNTER — Other Ambulatory Visit: Payer: Self-pay

## 2020-04-28 ENCOUNTER — Encounter (HOSPITAL_COMMUNITY): Payer: Self-pay

## 2020-04-28 DIAGNOSIS — L03116 Cellulitis of left lower limb: Secondary | ICD-10-CM

## 2020-04-28 MED ORDER — DOXYCYCLINE HYCLATE 100 MG PO CAPS
100.0000 mg | ORAL_CAPSULE | Freq: Two times a day (BID) | ORAL | 0 refills | Status: AC
Start: 2020-04-28 — End: 2020-05-05

## 2020-04-28 NOTE — Discharge Instructions (Signed)
Treating you for cellulitis. Take antibiotics as prescribed. Please return for any continued or worsening symptoms.

## 2020-04-28 NOTE — ED Triage Notes (Signed)
Pt states has gout in left 2nd digit footx3 days. Pt states he ate a couple hot dogs a couple days ago. Pt has bumps on his foot he states.  Pt has 2+ edema of left foot and toes. Pt has a couple of blisters on top of left foot.

## 2020-04-29 ENCOUNTER — Telehealth: Payer: Self-pay

## 2020-04-29 DIAGNOSIS — I129 Hypertensive chronic kidney disease with stage 1 through stage 4 chronic kidney disease, or unspecified chronic kidney disease: Secondary | ICD-10-CM | POA: Diagnosis not present

## 2020-04-29 DIAGNOSIS — N189 Chronic kidney disease, unspecified: Secondary | ICD-10-CM | POA: Diagnosis not present

## 2020-04-29 DIAGNOSIS — I2510 Atherosclerotic heart disease of native coronary artery without angina pectoris: Secondary | ICD-10-CM | POA: Diagnosis not present

## 2020-04-29 DIAGNOSIS — Z7982 Long term (current) use of aspirin: Secondary | ICD-10-CM | POA: Diagnosis not present

## 2020-04-29 DIAGNOSIS — F319 Bipolar disorder, unspecified: Secondary | ICD-10-CM | POA: Diagnosis not present

## 2020-04-29 DIAGNOSIS — E1122 Type 2 diabetes mellitus with diabetic chronic kidney disease: Secondary | ICD-10-CM | POA: Diagnosis not present

## 2020-04-29 DIAGNOSIS — Z794 Long term (current) use of insulin: Secondary | ICD-10-CM | POA: Diagnosis not present

## 2020-04-29 DIAGNOSIS — I25118 Atherosclerotic heart disease of native coronary artery with other forms of angina pectoris: Secondary | ICD-10-CM | POA: Diagnosis not present

## 2020-04-29 DIAGNOSIS — Z7901 Long term (current) use of anticoagulants: Secondary | ICD-10-CM | POA: Diagnosis not present

## 2020-04-29 DIAGNOSIS — R9431 Abnormal electrocardiogram [ECG] [EKG]: Secondary | ICD-10-CM | POA: Diagnosis not present

## 2020-04-29 NOTE — ED Provider Notes (Signed)
[facility]    CSN: [id number] Arrival date & time: 04/28/20  1312      History   Chief Complaint Chief Complaint  Patient presents with  . Gout    HPI Christian [name] is a 69 y.o. male.   Patient is a 68 year old male with past medical history of bilateral lower extremity edema, venous stasis, bipolar, chronic kidney disease, gout, blood clots, hypertension, OSA on CPAP, type 2 diabetes, CHF, NSTEMI, CAD.  He presents today with left toe, foot, leg pain.  The pain started in the left small toe it has radiated up to the left foot and left leg.  There is generalized erythema, swelling.  He has dried blood to left small toe with no obvious open wound.  Denies any injuries.  Left leg more swollen, red and warm than right.  No fevers, chills, body aches, night sweats, nausea. Has taken tylenol for the pain.   ROS per HPI      Past Medical History:  Diagnosis Date  . Bilateral lower extremity edema: chronic with venous stasis changes 06/02/2014  . Bipolar 1 disorder (HCC)   . Bipolar disorder (HCC)   . Chronic kidney disease   . Gout   . Gout   . Hx of blood clots   . Hypertension   . Mood swings   . OSA on CPAP 06/02/2014  . Other and unspecified hyperlipidemia 06/02/2014  . Type II or unspecified type diabetes mellitus with unspecified complication, uncontrolled 06/02/2014    Patient Active Problem List   Diagnosis Date Noted  . Type 2 diabetes mellitus with stage 3 chronic kidney disease (HCC) 04/01/2020  . CAD (coronary artery disease), native coronary artery 04/01/2020  . NSTEMI (non-ST elevated myocardial infarction) (HCC) 03/31/2020  . Obesity, Class III, BMI 40-49.9 (morbid obesity) (HCC) 03/31/2020  . Aspiration pneumonia of both lower lobes due to gastric secretions (HCC) 03/21/2020  . Sacral decubitus ulcer, stage II (HCC) 03/19/2020  . Chronic diastolic CHF (congestive heart failure) (HCC) 03/17/2020  . Encephalopathy   . Paroxysmal atrial fibrillation  (HCC) 06/18/2018  . History of pulmonary embolism 06/17/2018  . Generalized weakness 03/27/2018  . Diabetes mellitus type 2 in nonobese (HCC) 03/27/2018  . Pulmonary embolism (HCC) 03/27/2018  . Fall   . Laceration of eyebrow   . Bipolar I disorder, most recent episode depressed, severe w psychosis (HCC) 12/22/2015  . Bipolar affective disorder, depressed, severe, with psychotic behavior (HCC)   . Bipolar I disorder, current or most recent episode depressed, with psychotic features (HCC)   . Bipolar I disorder, most recent episode depressed (HCC)   . Severe bipolar I disorder with depression (HCC)   . Bipolar I disorder, most recent episode depressed, severe with psychotic features (HCC)   . Overdose 09/11/2015  . Acute respiratory failure with hypoxia (HCC) 09/05/2015  . Elevated troponin 09/05/2015  . Somnolence 09/05/2015  . Bipolar depression (HCC) 09/05/2015  . Acute bilateral deep vein thrombosis (DVT) of femoral veins (HCC) 09/05/2015  . Bilateral pulmonary embolism (HCC) 09/02/2015  . Labile hypertension   . Pulmonary embolism with acute cor pulmonale (HCC)   . Dyspnea   . Orthostatic hypotension   . Bipolar disorder, in partial remission, most recent episode depressed (HCC)   . Syncope, near 08/31/2015  . Affective psychosis, bipolar (HCC)   . Bipolar affective disorder, depressed, mild (HCC) 08/28/2015  . Pressure ulcer 07/30/2015  . Protein-calorie malnutrition, severe (HCC) 07/30/2015  . Dehydration 07/29/2015  . Diabetes (  HCC) 07/09/2015  . UTI (urinary tract infection) 06/19/2014  . Positive blood culture 06/19/2014  . Type 2 diabetes mellitus with complication, without long-term current use of insulin (HCC) 06/02/2014  . Other and unspecified hyperlipidemia 06/02/2014  . OSA on CPAP 06/02/2014  . Bilateral lower extremity edema: chronic with venous stasis changes 06/02/2014  . Gout 06/02/2014  . Hypertension     Past Surgical History:  Procedure Laterality  Date  . LEFT HEART CATH AND CORONARY ANGIOGRAPHY Left 04/01/2020   Procedure: LEFT HEART CATH AND CORONARY ANGIOGRAPHY;  Surgeon: Laurier Nancy, MD;  Location: [facility];  Service: Cardiovascular;  Laterality: Left;       Home Medications    Prior to Admission medications   Medication Sig Start Date End Date Taking? Authorizing Provider  ACETAMINOPHEN EXTRA STRENGTH 500 MG tablet Take 1,000 mg by mouth 2 (two) times daily. 02/14/20   [provider]  amLODipine (NORVASC) 10 MG tablet Take 1 tablet (10 mg total) by mouth daily. 02/06/20   Starkes-Perry, Juel Burrow, FNP  ARIPiprazole (ABILIFY) 20 MG tablet Take 1 tablet (20 mg total) by mouth daily. 02/06/20   Maryagnes Amos, FNP  aspirin EC 81 MG EC tablet Take 1 tablet (81 mg total) by mouth daily. 04/01/20   Dhungel, Theda Belfast, MD  atorvastatin (LIPITOR) 10 MG tablet Take 1 tablet (10 mg total) by mouth daily at 6 PM. Patient taking differently: Take 80 mg by mouth daily at 6 PM.  02/05/20   Starkes-Perry, Juel Burrow, FNP  buPROPion (WELLBUTRIN) 75 MG tablet Take 1 tablet (75 mg total) by mouth daily. 02/06/20   Starkes-Perry, Juel Burrow, FNP  divalproex (DEPAKOTE) 500 MG DR tablet Take 1 tablet (500 mg total) by mouth every 12 (twelve) hours. 02/05/20   Starkes-Perry, Juel Burrow, FNP  doxycycline (VIBRAMYCIN) 100 MG capsule Take 1 capsule (100 mg total) by mouth 2 (two) times daily for 7 days. 04/28/20 05/05/20  Dahlia Byes A, NP  furosemide (LASIX) 20 MG tablet Take 1 tablet (20 mg total) by mouth daily. 10/24/19   Linwood Dibbles, MD  glimepiride (AMARYL) 2 MG tablet Take 2 mg by mouth daily with breakfast.    [provider]  insulin glargine (LANTUS SOLOSTAR) 100 UNIT/ML Solostar Pen daily as needed.    [provider]  liothyronine (CYTOMEL) 25 MCG tablet Take 25 mcg by mouth daily before breakfast.     [provider]  lisinopril (ZESTRIL) 40 MG tablet Take 1 tablet (40 mg total) by mouth daily. 02/06/20    Starkes-Perry, Juel Burrow, FNP  metoprolol succinate (TOPROL-XL) 50 MG 24 hr tablet Take 1 tablet (50 mg total) by mouth daily. Take with or immediately following a meal. Patient taking differently: Take 25 mg by mouth daily. Take with or immediately following a meal. 03/22/20   Marinda Elk, MD  mirtazapine (REMERON SOL-TAB) 15 MG disintegrating tablet Take 1 tablet (15 mg total) by mouth at bedtime. 02/05/20   Starkes-Perry, Juel Burrow, FNP  QUEtiapine (SEROQUEL) 25 MG tablet Take 25 mg by mouth 2 (two) times daily.     [provider]  QUEtiapine (SEROQUEL) 300 MG tablet Take 300 mg by mouth at bedtime.    [provider]  rivaroxaban (XARELTO) 20 MG TABS tablet Take 1 tablet (20 mg total) by mouth daily with supper. 04/02/20   Dhungel, Nishant, MD  sitaGLIPtin (JANUVIA) 25 MG tablet Take 25 mg by mouth daily.    [provider]  Family History Family History  Problem Relation Age of Onset  . Dementia Mother   . Arthritis Father   . Hypertension Father   . Mental illness Sister   . Diabetes Sister   . Heart failure Neg Hx   . Kidney failure Neg Hx   . Cancer Neg Hx     Social History Social History   Tobacco Use  . Smoking status: Never Smoker  . Smokeless tobacco: Never Used  Substance Use Topics  . Alcohol use: No  . Drug use: No     Allergies   Zyprexa [olanzapine]   Review of Systems Review of Systems   Physical Exam Triage Vital Signs ED Triage Vitals  Enc Vitals Group     BP 04/28/20 1354 138/71     Pulse Rate 04/28/20 1354 99     Resp 04/28/20 1354 16     Temp 04/28/20 1354 98.4 F (36.9 C)     Temp Source 04/28/20 1354 Oral     SpO2 04/28/20 1354 98 %     Weight 04/28/20 1359 297 lb (134.7 kg)     Height 04/28/20 1359 6' (1.829 m)     Head Circumference --      Peak Flow --      Pain Score 04/28/20 1358 8     Pain Loc --      Pain Edu? --      Excl. in GC? --    No data found.  Updated Vital Signs BP 138/71    Pulse 99   Temp 98.4 F (36.9 C) (Oral)   Resp 16   Ht 6' (1.829 m)   Wt 297 lb (134.7 kg)   SpO2 98%   BMI 40.28 kg/m   Visual Acuity Right Eye Distance:   Left Eye Distance:   Bilateral Distance:    Right Eye Near:   Left Eye Near:    Bilateral Near:     Physical Exam Vitals and nursing note reviewed.  Constitutional:      Appearance: Normal appearance.  HENT:     Head: Normocephalic and atraumatic.     Nose: Nose normal.  Eyes:     Conjunctiva/sclera: Conjunctivae normal.  Pulmonary:     Effort: Pulmonary effort is normal.  Abdominal:     Palpations: Abdomen is soft.     Tenderness: There is no abdominal tenderness.  Musculoskeletal:        General: Normal range of motion.     Cervical back: Normal range of motion.     Right lower leg: Edema present.     Left lower leg: Edema present.     Comments: Significant bilateral lower extremity edema which appears to be chronic for patient. Worsening edema in the left lower extremity with generalized erythema, or increased warmth Generalized tenderness Appears to be stemming from left small toe with dried blood possible source of infection  Skin:    General: Skin is warm and dry.  Neurological:     Mental Status: He is alert.  Psychiatric:        Mood and Affect: Mood normal.      UC Treatments / Results  Labs (all labs ordered are listed, but only abnormal results are displayed) Labs Reviewed - No data to display  EKG   Radiology No results found.  Procedures Procedures (including critical care time)  Medications Ordered in UC Medications - No data to display  Initial Impression / Assessment and Plan / UC Course  I have reviewed the triage vital signs and the nursing notes.  Pertinent labs & imaging results that were available during my care of the patient were reviewed by me and considered in my medical decision making (see chart for details).     Cellulitis of left lower extremity.  Most likely  diagnosis based on increased swelling, erythema and increased warmth Most likely source is from left small toe which has dried blood is likely open wound and source of infection. Not convinced of gout at this time We will have him start doxycycline for infection Tylenol for pain as needed Recommend follow-up in the next couple days if symptoms are worsening or not improving Final Clinical Impressions(s) / UC Diagnoses   Final diagnoses:  Cellulitis of left lower extremity     Discharge Instructions     Treating you for cellulitis. Take antibiotics as prescribed. Please return for any continued or worsening symptoms.    ED Prescriptions    Medication Sig Dispense Auth. Provider   doxycycline (VIBRAMYCIN) 100 MG capsule Take 1 capsule (100 mg total) by mouth 2 (two) times daily for 7 days. 14 capsule Mialee Weyman A, NP     PDMP not reviewed this encounter.   Orvan July, NP 04/29/20 1116

## 2020-04-29 NOTE — Telephone Encounter (Signed)
[name] daughter called to let us know that he will not be here the rest of this week due to a procedure he is having at [facility].  Staff reminded her to get clearance for him to return and gave her fax number.

## 2020-04-29 NOTE — Telephone Encounter (Signed)
See telephone note.

## 2020-04-30 DIAGNOSIS — Z7982 Long term (current) use of aspirin: Secondary | ICD-10-CM | POA: Diagnosis not present

## 2020-04-30 DIAGNOSIS — R9431 Abnormal electrocardiogram [ECG] [EKG]: Secondary | ICD-10-CM | POA: Diagnosis not present

## 2020-04-30 DIAGNOSIS — Z794 Long term (current) use of insulin: Secondary | ICD-10-CM | POA: Diagnosis not present

## 2020-04-30 DIAGNOSIS — N189 Chronic kidney disease, unspecified: Secondary | ICD-10-CM | POA: Diagnosis not present

## 2020-04-30 DIAGNOSIS — I25118 Atherosclerotic heart disease of native coronary artery with other forms of angina pectoris: Secondary | ICD-10-CM | POA: Diagnosis not present

## 2020-04-30 DIAGNOSIS — F319 Bipolar disorder, unspecified: Secondary | ICD-10-CM | POA: Diagnosis not present

## 2020-04-30 DIAGNOSIS — Z7901 Long term (current) use of anticoagulants: Secondary | ICD-10-CM | POA: Diagnosis not present

## 2020-04-30 DIAGNOSIS — E1122 Type 2 diabetes mellitus with diabetic chronic kidney disease: Secondary | ICD-10-CM | POA: Diagnosis not present

## 2020-04-30 DIAGNOSIS — I129 Hypertensive chronic kidney disease with stage 1 through stage 4 chronic kidney disease, or unspecified chronic kidney disease: Secondary | ICD-10-CM | POA: Diagnosis not present

## 2020-04-30 DIAGNOSIS — I2510 Atherosclerotic heart disease of native coronary artery without angina pectoris: Secondary | ICD-10-CM | POA: Diagnosis not present

## 2020-05-02 DIAGNOSIS — E782 Mixed hyperlipidemia: Secondary | ICD-10-CM | POA: Diagnosis not present

## 2020-05-02 DIAGNOSIS — I2510 Atherosclerotic heart disease of native coronary artery without angina pectoris: Secondary | ICD-10-CM | POA: Diagnosis not present

## 2020-05-02 DIAGNOSIS — R609 Edema, unspecified: Secondary | ICD-10-CM | POA: Diagnosis not present

## 2020-05-02 DIAGNOSIS — Z9861 Coronary angioplasty status: Secondary | ICD-10-CM | POA: Diagnosis not present

## 2020-05-02 DIAGNOSIS — I10 Essential (primary) hypertension: Secondary | ICD-10-CM | POA: Diagnosis not present

## 2020-05-02 DIAGNOSIS — I509 Heart failure, unspecified: Secondary | ICD-10-CM | POA: Diagnosis not present

## 2020-05-02 DIAGNOSIS — E669 Obesity, unspecified: Secondary | ICD-10-CM | POA: Diagnosis not present

## 2020-05-06 DIAGNOSIS — I2510 Atherosclerotic heart disease of native coronary artery without angina pectoris: Secondary | ICD-10-CM | POA: Diagnosis not present

## 2020-05-06 DIAGNOSIS — R609 Edema, unspecified: Secondary | ICD-10-CM | POA: Diagnosis not present

## 2020-05-06 DIAGNOSIS — E782 Mixed hyperlipidemia: Secondary | ICD-10-CM | POA: Diagnosis not present

## 2020-05-06 DIAGNOSIS — G4739 Other sleep apnea: Secondary | ICD-10-CM | POA: Diagnosis not present

## 2020-05-06 DIAGNOSIS — I10 Essential (primary) hypertension: Secondary | ICD-10-CM | POA: Diagnosis not present

## 2020-05-06 DIAGNOSIS — I509 Heart failure, unspecified: Secondary | ICD-10-CM | POA: Diagnosis not present

## 2020-05-08 ENCOUNTER — Encounter: Payer: Medicare PPO | Attending: Cardiovascular Disease

## 2020-05-08 DIAGNOSIS — I129 Hypertensive chronic kidney disease with stage 1 through stage 4 chronic kidney disease, or unspecified chronic kidney disease: Secondary | ICD-10-CM | POA: Insufficient documentation

## 2020-05-08 DIAGNOSIS — E785 Hyperlipidemia, unspecified: Secondary | ICD-10-CM | POA: Insufficient documentation

## 2020-05-08 DIAGNOSIS — Z7982 Long term (current) use of aspirin: Secondary | ICD-10-CM | POA: Insufficient documentation

## 2020-05-08 DIAGNOSIS — M109 Gout, unspecified: Secondary | ICD-10-CM | POA: Insufficient documentation

## 2020-05-08 DIAGNOSIS — Z79890 Hormone replacement therapy (postmenopausal): Secondary | ICD-10-CM | POA: Insufficient documentation

## 2020-05-08 DIAGNOSIS — Z79899 Other long term (current) drug therapy: Secondary | ICD-10-CM | POA: Insufficient documentation

## 2020-05-08 DIAGNOSIS — E1122 Type 2 diabetes mellitus with diabetic chronic kidney disease: Secondary | ICD-10-CM | POA: Insufficient documentation

## 2020-05-08 DIAGNOSIS — G4733 Obstructive sleep apnea (adult) (pediatric): Secondary | ICD-10-CM | POA: Insufficient documentation

## 2020-05-08 DIAGNOSIS — Z7984 Long term (current) use of oral hypoglycemic drugs: Secondary | ICD-10-CM | POA: Insufficient documentation

## 2020-05-08 DIAGNOSIS — Z7901 Long term (current) use of anticoagulants: Secondary | ICD-10-CM | POA: Insufficient documentation

## 2020-05-08 DIAGNOSIS — Z86718 Personal history of other venous thrombosis and embolism: Secondary | ICD-10-CM | POA: Insufficient documentation

## 2020-05-08 DIAGNOSIS — F319 Bipolar disorder, unspecified: Secondary | ICD-10-CM | POA: Insufficient documentation

## 2020-05-08 DIAGNOSIS — N189 Chronic kidney disease, unspecified: Secondary | ICD-10-CM | POA: Insufficient documentation

## 2020-05-08 DIAGNOSIS — I214 Non-ST elevation (NSTEMI) myocardial infarction: Secondary | ICD-10-CM | POA: Insufficient documentation

## 2020-05-09 DIAGNOSIS — G4733 Obstructive sleep apnea (adult) (pediatric): Secondary | ICD-10-CM | POA: Diagnosis not present

## 2020-05-14 ENCOUNTER — Encounter: Payer: Self-pay | Admitting: *Deleted

## 2020-05-14 DIAGNOSIS — I214 Non-ST elevation (NSTEMI) myocardial infarction: Secondary | ICD-10-CM

## 2020-05-14 NOTE — Progress Notes (Signed)
Cardiac Individual Treatment Plan  Patient Details  Name: LINTON STOLP MRN: [id number] Date of Birth: September 21, 1951 Referring Provider:     Cardiac Rehab from 04/15/2020 in [facility] Cardiac and Pulmonary Rehab  Referring Provider Neoma Laming  MD      Initial Encounter Date:    Cardiac Rehab from 04/15/2020 in [facility] Cardiac and Pulmonary Rehab  Date 04/15/20      Visit Diagnosis: NSTEMI (non-ST elevated myocardial infarction) [facility])  Patient's Home Medications on Admission:  Current Outpatient Medications:  .  ACETAMINOPHEN EXTRA STRENGTH 500 MG tablet, Take 1,000 mg by mouth 2 (two) times daily., Disp: , Rfl:  .  amLODipine (NORVASC) 10 MG tablet, Take 1 tablet (10 mg total) by mouth daily., Disp: 30 tablet, Rfl: 0 .  ARIPiprazole (ABILIFY) 20 MG tablet, Take 1 tablet (20 mg total) by mouth daily., Disp: 30 tablet, Rfl: 0 .  aspirin EC 81 MG EC tablet, Take 1 tablet (81 mg total) by mouth daily., Disp: 30 tablet, Rfl: 0 .  atorvastatin (LIPITOR) 10 MG tablet, Take 1 tablet (10 mg total) by mouth daily at 6 PM. (Patient taking differently: Take 80 mg by mouth daily at 6 PM. ), Disp: 30 tablet, Rfl: 0 .  buPROPion (WELLBUTRIN) 75 MG tablet, Take 1 tablet (75 mg total) by mouth daily., Disp: 30 tablet, Rfl: 0 .  divalproex (DEPAKOTE) 500 MG DR tablet, Take 1 tablet (500 mg total) by mouth every 12 (twelve) hours., Disp: 60 tablet, Rfl: 0 .  furosemide (LASIX) 20 MG tablet, Take 1 tablet (20 mg total) by mouth daily., Disp: 30 tablet, Rfl: 0 .  glimepiride (AMARYL) 2 MG tablet, Take 2 mg by mouth daily with breakfast., Disp: , Rfl:  .  insulin glargine (LANTUS SOLOSTAR) 100 UNIT/ML Solostar Pen, daily as needed., Disp: , Rfl:  .  liothyronine (CYTOMEL) 25 MCG tablet, Take 25 mcg by mouth daily before breakfast. , Disp: , Rfl:  .  lisinopril (ZESTRIL) 40 MG tablet, Take 1 tablet (40 mg total) by mouth daily., Disp: 30 tablet, Rfl: 0 .  metoprolol succinate (TOPROL-XL) 50 MG 24 hr tablet, Take  1 tablet (50 mg total) by mouth daily. Take with or immediately following a meal. (Patient taking differently: Take 25 mg by mouth daily. Take with or immediately following a meal.), Disp: 60 tablet, Rfl: 3 .  mirtazapine (REMERON SOL-TAB) 15 MG disintegrating tablet, Take 1 tablet (15 mg total) by mouth at bedtime., Disp: 30 tablet, Rfl: 0 .  QUEtiapine (SEROQUEL) 25 MG tablet, Take 25 mg by mouth 2 (two) times daily. , Disp: , Rfl:  .  QUEtiapine (SEROQUEL) 300 MG tablet, Take 300 mg by mouth at bedtime., Disp: , Rfl:  .  rivaroxaban (XARELTO) 20 MG TABS tablet, Take 1 tablet (20 mg total) by mouth daily with supper., Disp: 30 tablet, Rfl: 0 .  sitaGLIPtin (JANUVIA) 25 MG tablet, Take 25 mg by mouth daily., Disp: , Rfl:  No current facility-administered medications for this visit.  Facility-Administered Medications Ordered in Other Visits:  .  ondansetron (ZOFRAN) injection 4 mg, 4 mg, Intravenous, Once PRN, Ronelle Nigh Jamse Mead, MD  Past Medical History: Past Medical History:  Diagnosis Date  . Bilateral lower extremity edema: chronic with venous stasis changes 06/02/2014  . Bipolar 1 disorder ([place])   . Bipolar disorder ([place])   . Chronic kidney disease   . Gout   . Gout   . Hx of blood clots   . Hypertension   .  Mood swings   . OSA on CPAP 06/02/2014  . Other and unspecified hyperlipidemia 06/02/2014  . Type II or unspecified type diabetes mellitus with unspecified complication, uncontrolled 06/02/2014    Tobacco Use: Social History   Tobacco Use  Smoking Status Never Smoker  Smokeless Tobacco Never Used    Labs: Recent Review Flowsheet Data    Labs for ITP Cardiac and Pulmonary Rehab Latest Ref Rng & Units 09/01/2015 12/28/2015 01/30/2020 03/17/2020 03/31/2020   Cholestrol 0 - 200 mg/dL - 108 - - -   LDLCALC 0 - 99 mg/dL - 63 - - -   HDL >40 mg/dL - 33(L) - - -   Trlycerides <150 mg/dL - 62 - - -   Hemoglobin A1c 4.8 - 5.6 % 7.5(H) - 7.1(H) - 8.7(H)   PHART 7.35 - 7.45 7.51(H) - - 7.40  -   PCO2ART 32 - 48 mmHg 27(L) - - 41 -   HCO3 20.0 - 28.0 mmol/L 21.5 - - 25.4 -   TCO2 0 - 100 mmol/L - - - - -   ACIDBASEDEF 0.0 - 2.0 mmol/L 0.3 - - - -   O2SAT % 98.2 - - 90.6 -       Exercise Target Goals: Exercise Program Goal: Individual exercise prescription set using results from initial 6 min walk test and THRR while considering  patient's activity barriers and safety.   Exercise Prescription Goal: Initial exercise prescription builds to 30-45 minutes a day of aerobic activity, 2-3 days per week.  Home exercise guidelines will be given to patient during program as part of exercise prescription that the participant will acknowledge.   Education: Aerobic Exercise & Resistance Training: - Gives group verbal and written instruction on the various components of exercise. Focuses on aerobic and resistive training programs and the benefits of this training and how to safely progress through these programs..   Education: Exercise & Equipment Safety: - Individual verbal instruction and demonstration of equipment use and safety with use of the equipment.   Cardiac Rehab from 04/15/2020 in [facility] Cardiac and Pulmonary Rehab  Date 04/15/20  Educator [facility]  Instruction Review Code 1- Verbalizes Understanding      Education: Exercise Physiology & General Exercise Guidelines: - Group verbal and written instruction with models to review the exercise physiology of the cardiovascular system and associated critical values. Provides general exercise guidelines with specific guidelines to those with heart or lung disease.    Education: Flexibility, Balance, Mind/Body Relaxation: Provides group verbal/written instruction on the benefits of flexibility and balance training, including mind/body exercise modes such as yoga, pilates and tai chi.  Demonstration and skill practice provided.   Activity Barriers & Risk Stratification:  Activity Barriers & Cardiac Risk Stratification - 04/15/20 1536       Activity Barriers & Cardiac Risk Stratification   Activity Barriers Balance Concerns;Assistive Device;Decreased Ventricular Function;Deconditioning;Muscular Weakness;Back Problems;Other (comment);Joint Problems    Comments occasional back pain, torn rotator cuff (bilateral)    Cardiac Risk Stratification High           6 Minute Walk:  6 Minute Walk    Row Name 04/15/20 1535         6 Minute Walk   Distance 847 feet     Walk Time 6 minutes     # of Rest Breaks 0     MPH 1.6     METS 2.26     RPE 11     Perceived Dyspnea  3  VO2 Peak 7.89     Symptoms Yes (comment)     Comments SOB, hip pain 5/10     Resting HR 86 bpm     Resting BP 146/72     Resting Oxygen Saturation  97 %     Exercise Oxygen Saturation  during 6 min walk 96 %     Max Ex. HR 125 bpm     Max Ex. BP 176/88     2 Minute Post BP 142/78            Oxygen Initial Assessment:   Oxygen Re-Evaluation:   Oxygen Discharge (Final Oxygen Re-Evaluation):   Initial Exercise Prescription:  Initial Exercise Prescription - 04/15/20 1500      Date of Initial Exercise RX and Referring Provider   Date 04/15/20    Referring Provider Neoma Laming  MD      Treadmill   MPH 1.4    Grade 0.5    Minutes 15    METs 2.17      NuStep   Level 1    SPM 80    Minutes 15    METs 2      T5 Nustep   Level 1    SPM 80    Minutes 15    METs 2      Biostep-RELP   Level 1    SPM 50    Minutes 15    METs 2      Prescription Details   Frequency (times per week) 2    Duration Progress to 30 minutes of continuous aerobic without signs/symptoms of physical distress      Intensity   THRR 40-80% of Max Heartrate 113-140    Ratings of Perceived Exertion 11-13    Perceived Dyspnea 0-4      Progression   Progression Continue to progress workloads to maintain intensity without signs/symptoms of physical distress.      Resistance Training   Training Prescription Yes    Weight 3 lb    Reps 10-15            Perform Capillary Blood Glucose checks as needed.  Exercise Prescription Changes:  Exercise Prescription Changes    Row Name 04/15/20 1500 04/29/20 1500           Response to Exercise   Blood Pressure (Admit) 146/72 138/80      Blood Pressure (Exercise) 176/88 176/80      Blood Pressure (Exit) 142/78 128/60      Heart Rate (Admit) 86 bpm 108 bpm      Heart Rate (Exercise) 125 bpm 136 bpm      Heart Rate (Exit) 90 bpm 103 bpm      Oxygen Saturation (Admit) 97 % --      Oxygen Saturation (Exercise) 96 % --      Rating of Perceived Exertion (Exercise) 11 15      Perceived Dyspnea (Exercise) 3 --      Symptoms SOB, hip pain 5/10, shuffling gait SOB      Comments walk test results first full day of exercise      Duration -- Progress to 30 minutes of  aerobic without signs/symptoms of physical distress      Intensity -- THRR unchanged        Progression   Progression -- Continue to progress workloads to maintain intensity without signs/symptoms of physical distress.      Average METs -- 1.98  Resistance Training   Training Prescription -- Yes      Weight -- 3 lb      Reps -- 10-15        Interval Training   Interval Training -- No        Treadmill   MPH -- 1.4      Grade -- 0.5      Minutes -- 15      METs -- 2.17        T5 Nustep   Level -- 1      Minutes -- 15      METs -- 1.8             Exercise Comments:  Exercise Comments    Row Name 04/22/20 1018           Exercise Comments : First full day of exercise!  Patient was oriented to gym and equipment including functions, settings, policies, and procedures.  Patient's individual exercise prescription and treatment plan were reviewed.  All starting workloads were established based on the results of the 6 minute walk test done at initial orientation visit.  The plan for exercise progression was also introduced and progression will be customized based on patient's performance and goals.               Exercise Goals and Review:  Exercise Goals    Row Name 04/15/20 1538             Exercise Goals   Increase Physical Activity Yes       Intervention Provide advice, education, support and counseling about physical activity/exercise needs.;Develop an individualized exercise prescription for aerobic and resistive training based on initial evaluation findings, risk stratification, comorbidities and participant's personal goals.       Expected Outcomes Short Term: Attend rehab on a regular basis to increase amount of physical activity.;Long Term: Add in home exercise to make exercise part of routine and to increase amount of physical activity.;Long Term: Exercising regularly at least 3-5 days a week.       Increase Strength and Stamina Yes       Intervention Provide advice, education, support and counseling about physical activity/exercise needs.;Develop an individualized exercise prescription for aerobic and resistive training based on initial evaluation findings, risk stratification, comorbidities and participant's personal goals.       Expected Outcomes Short Term: Increase workloads from initial exercise prescription for resistance, speed, and METs.;Short Term: Perform resistance training exercises routinely during rehab and add in resistance training at home;Long Term: Improve cardiorespiratory fitness, muscular endurance and strength as measured by increased METs and functional capacity (6MWT)       Able to understand and use rate of perceived exertion (RPE) scale Yes       Intervention Provide education and explanation on how to use RPE scale       Expected Outcomes Short Term: Able to use RPE daily in rehab to express subjective intensity level;Long Term:  Able to use RPE to guide intensity level when exercising independently       Able to understand and use Dyspnea scale Yes       Intervention Provide education and explanation on how to use Dyspnea scale       Expected Outcomes Short  Term: Able to use Dyspnea scale daily in rehab to express subjective sense of shortness of breath during exertion;Long Term: Able to use Dyspnea scale to guide intensity level when exercising independently  Knowledge and understanding of Target Heart Rate Range (THRR) Yes       Intervention Provide education and explanation of THRR including how the numbers were predicted and where they are located for reference       Expected Outcomes Short Term: Able to state/look up THRR;Short Term: Able to use daily as guideline for intensity in rehab;Long Term: Able to use THRR to govern intensity when exercising independently       Able to check pulse independently Yes       Intervention Provide education and demonstration on how to check pulse in carotid and radial arteries.;Review the importance of being able to check your own pulse for safety during independent exercise       Expected Outcomes Short Term: Able to explain why pulse checking is important during independent exercise;Long Term: Able to check pulse independently and accurately       Understanding of Exercise Prescription Yes       Intervention Provide education, explanation, and written materials on patient's individual exercise prescription       Expected Outcomes Short Term: Able to explain program exercise prescription;Long Term: Able to explain home exercise prescription to exercise independently              Exercise Goals Re-Evaluation :  Exercise Goals Re-Evaluation    Row Name 04/22/20 1018 04/29/20 1520           Exercise Goal Re-Evaluation   Exercise Goals Review Knowledge and understanding of Target Heart Rate Range (THRR);Able to understand and use rate of perceived exertion (RPE) scale;Understanding of Exercise Prescription Increase Physical Activity;Increase Strength and Stamina;Understanding of Exercise Prescription      Comments Reviewed RPE and dyspnea scales, THR and program prescription with pt today.  Pt voiced  understanding and was given a copy of goals to take home. Aspen has only attended one session thus far and is having a cath/stent done today.  He will need clearance to return.  We will continue to monitor his progress.      Expected Outcomes Short: Use RPE daily to regulate intensity. Long: Follow program prescription in THR. Short: Clearance to return to rehab  Long: Continue to follow program prescription.             Discharge Exercise Prescription (Final Exercise Prescription Changes):  Exercise Prescription Changes - 04/29/20 1500      Response to Exercise   Blood Pressure (Admit) 138/80    Blood Pressure (Exercise) 176/80    Blood Pressure (Exit) 128/60    Heart Rate (Admit) 108 bpm    Heart Rate (Exercise) 136 bpm    Heart Rate (Exit) 103 bpm    Rating of Perceived Exertion (Exercise) 15    Symptoms SOB    Comments first full day of exercise    Duration Progress to 30 minutes of  aerobic without signs/symptoms of physical distress    Intensity THRR unchanged      Progression   Progression Continue to progress workloads to maintain intensity without signs/symptoms of physical distress.    Average METs 1.98      Resistance Training   Training Prescription Yes    Weight 3 lb    Reps 10-15      Interval Training   Interval Training No      Treadmill   MPH 1.4    Grade 0.5    Minutes 15    METs 2.17      T5 Nustep   Level  1    Minutes 15    METs 1.8           Nutrition:  Target Goals: Understanding of nutrition guidelines, daily intake of sodium <1535m, cholesterol <2076m calories 30% from fat and 7% or less from saturated fats, daily to have 5 or more servings of fruits and vegetables.  Education: Controlling Sodium/Reading Food Labels -Group verbal and written material supporting the discussion of sodium use in heart healthy nutrition. Review and explanation with models, verbal and written materials for utilization of the food label.   Education:  General Nutrition Guidelines/Fats and Fiber: -Group instruction provided by verbal, written material, models and posters to present the general guidelines for heart healthy nutrition. Gives an explanation and review of dietary fats and fiber.   Biometrics:  Pre Biometrics - 04/15/20 1538      Pre Biometrics   Height 6' 0.9" (1.852 m)    Weight 298 lb 8 oz (135.4 kg)    BMI (Calculated) 39.48    Single Leg Stand 0 seconds            Nutrition Therapy Plan and Nutrition Goals:   Nutrition Assessments:  Nutrition Assessments - 04/15/20 1539      MEDFICTS Scores   Pre Score 38           MEDIFICTS Score Key:          ?70 Need to make dietary changes          40-70 Heart Healthy Diet         ? 40 Therapeutic Level Cholesterol Diet  Nutrition Goals Re-Evaluation:   Nutrition Goals Discharge (Final Nutrition Goals Re-Evaluation):   Psychosocial: Target Goals: Acknowledge presence or absence of significant depression and/or stress, maximize coping skills, provide positive support system. Participant is able to verbalize types and ability to use techniques and skills needed for reducing stress and depression.   Education: Depression - Provides group verbal and written instruction on the correlation between heart/lung disease and depressed mood, treatment options, and the stigmas associated with seeking treatment.   Education: Sleep Hygiene -Provides group verbal and written instruction about how sleep can affect your health.  Define sleep hygiene, discuss sleep cycles and impact of sleep habits. Review good sleep hygiene tips.     Education: Stress and Anxiety: - Provides group verbal and written instruction about the health risks of elevated stress and causes of high stress.  Discuss the correlation between heart/lung disease and anxiety and treatment options. Review healthy ways to manage with stress and anxiety.    Initial Review & Psychosocial Screening:  Initial  Psych Review & Screening - 04/10/20 1446      Initial Review   Current issues with Current Anxiety/Panic;History of Depression;Current Psychotropic Meds;Current Depression;Current Stress Concerns    Source of Stress Concerns Chronic Illness;Retirement/disability    Comments On disability and retired, Would like to have more mobility, long history of bipolar disorder but feels well managed currently and denies current symptoms.      Family Dynamics   Good Support System? Yes   ex-wife, daughter, and sister (8 brothers and sister) all local     Barriers   Psychosocial barriers to participate in program The patient should benefit from training in stress management and relaxation.;Psychosocial barriers identified (see note)      Screening Interventions   Interventions Encouraged to exercise;To provide support and resources with identified psychosocial needs;Provide feedback about the scores to participant    Expected Outcomes Long  Term Goal: Stressors or current issues are controlled or eliminated.;Short Term goal: Utilizing psychosocial counselor, staff and physician to assist with identification of specific Stressors or current issues interfering with healing process. Setting desired goal for each stressor or current issue identified.;Short Term goal: Identification and review with participant of any Quality of Life or Depression concerns found by scoring the questionnaire.;Long Term goal: The participant improves quality of Life and PHQ9 Scores as seen by post scores and/or verbalization of changes           Quality of Life Scores:   Quality of Life - 04/15/20 1539      Quality of Life   Select Quality of Life      Quality of Life Scores   Health/Function Pre 18 %    Socioeconomic Pre 19 %    Psych/Spiritual Pre 18.86 %    Family Pre 25.5 %    GLOBAL Pre 19.35 %          Scores of 19 and below usually indicate a poorer quality of life in these areas.  A difference of  2-3 points  is a clinically meaningful difference.  A difference of 2-3 points in the total score of the Quality of Life Index has been associated with significant improvement in overall quality of life, self-image, physical symptoms, and general health in studies assessing change in quality of life.  PHQ-9: Recent Review Flowsheet Data    Depression screen [facility] 2/9 04/15/2020 10/09/2015   Decreased Interest 0 1   Down, Depressed, Hopeless 0 1   PHQ - 2 Score 0 2   Altered sleeping 0 1   Tired, decreased energy 1 1   Change in appetite 2 2   Feeling bad or failure about yourself  1 1   Trouble concentrating 0 0   Moving slowly or fidgety/restless 0 1   Suicidal thoughts 0 0   PHQ-9 Score 4 8   Difficult doing work/chores Not difficult at all Not difficult at all     Interpretation of Total Score  Total Score Depression Severity:  1-4 = Minimal depression, 5-9 = Mild depression, 10-14 = Moderate depression, 15-19 = Moderately severe depression, 20-27 = Severe depression   Psychosocial Evaluation and Intervention:  Psychosocial Evaluation - 04/10/20 1453      Psychosocial Evaluation & Interventions   Interventions Stress management education;Encouraged to exercise with the program and follow exercise prescription    Comments Jatavion is coming into cardiac rehab after a NSTEMI.  He has a long history of bipolar disorder but is medically managed and feels good currently.  He is divorced but has a good relationship with his ex-wife. He also is close to his daughter. They two of them will be sharing the task of getting him to rehab each day.  He is also one of 9 brother and sisters all of which, minus 2, leave within the county.  He is hoping that rehab will supplement his PT/OT and help improve his mobility.  He is also looking foward to getting out to meet people again.   Exercise will help with mood and strengthen for him.    Expected Outcomes Short: Attend rehab regularly for mood boost and strength  Long: Continued compliance on therapy and stable mood.    Continue Psychosocial Services  Follow up required by staff           Psychosocial Re-Evaluation:   Psychosocial Discharge (Final Psychosocial Re-Evaluation):   Vocational Rehabilitation: Provide vocational rehab  assistance to qualifying candidates.   Vocational Rehab Evaluation & Intervention:  Vocational Rehab - 04/10/20 1446      Initial Vocational Rehab Evaluation & Intervention   Assessment shows need for Vocational Rehabilitation No   disabled/retired          Education: Education Goals: Education classes will be provided on a variety of topics geared toward better understanding of heart health and risk factor modification. Participant will state understanding/return demonstration of topics presented as noted by education test scores.  Learning Barriers/Preferences:  Learning Barriers/Preferences - 04/10/20 1445      Learning Barriers/Preferences   Learning Barriers Sight   glasss   Learning Preferences None           General Cardiac Education Topics:  AED/CPR: - Group verbal and written instruction with the use of models to demonstrate the basic use of the AED with the basic ABC's of resuscitation.   Anatomy & Physiology of the Heart: - Group verbal and written instruction and models provide basic cardiac anatomy and physiology, with the coronary electrical and arterial systems. Review of Valvular disease and Heart Failure   Cardiac Procedures: - Group verbal and written instruction to review commonly prescribed medications for heart disease. Reviews the medication, class of the drug, and side effects. Includes the steps to properly store meds and maintain the prescription regimen. (beta blockers and nitrates)   Cardiac Medications I: - Group verbal and written instruction to review commonly prescribed medications for heart disease. Reviews the medication, class of the drug, and side effects.  Includes the steps to properly store meds and maintain the prescription regimen.   Cardiac Medications II: -Group verbal and written instruction to review commonly prescribed medications for heart disease. Reviews the medication, class of the drug, and side effects. (all other drug classes)    Go Sex-Intimacy & Heart Disease, Get SMART - Goal Setting: - Group verbal and written instruction through game format to discuss heart disease and the return to sexual intimacy. Provides group verbal and written material to discuss and apply goal setting through the application of the S.M.A.R.T. Method.   Other Matters of the Heart: - Provides group verbal, written materials and models to describe Stable Angina and Peripheral Artery. Includes description of the disease process and treatment options available to the cardiac patient.   Infection Prevention: - Provides verbal and written material to individual with discussion of infection control including proper hand washing and proper equipment cleaning during exercise session.   Cardiac Rehab from 04/15/2020 in [facility] Cardiac and Pulmonary Rehab  Date 04/15/20  Educator [facility]  Instruction Review Code 1- Verbalizes Understanding      Falls Prevention: - Provides verbal and written material to individual with discussion of falls prevention and safety.   Cardiac Rehab from 04/15/2020 in [facility] Cardiac and Pulmonary Rehab  Date 04/15/20  Educator [facility]  Instruction Review Code 1- Verbalizes Understanding      Other: -Provides group and verbal instruction on various topics (see comments)   Knowledge Questionnaire Score:  Knowledge Questionnaire Score - 04/15/20 1539      Knowledge Questionnaire Score   Pre Score 23/26 Education Focus: Angina, Nutrition, Exercise           Core Components/Risk Factors/Patient Goals at Admission:  Personal Goals and Risk Factors at Admission - 04/15/20 1540      Core Components/Risk Factors/Patient Goals on  Admission    Weight Management Yes;Weight Loss;Obesity    Intervention Weight Management: Develop a combined nutrition  and exercise program designed to reach desired caloric intake, while maintaining appropriate intake of nutrient and fiber, sodium and fats, and appropriate energy expenditure required for the weight goal.;Weight Management: Provide education and appropriate resources to help participant work on and attain dietary goals.;Weight Management/Obesity: Establish reasonable short term and long term weight goals.;Obesity: Provide education and appropriate resources to help participant work on and attain dietary goals.    Admit Weight 298 lb 8 oz (135.4 kg)    Goal Weight: Short Term 293 lb (132.9 kg)    Goal Weight: Long Term 288 lb (130.6 kg)    Expected Outcomes Short Term: Continue to assess and modify interventions until short term weight is achieved;Long Term: Adherence to nutrition and physical activity/exercise program aimed toward attainment of established weight goal;Weight Loss: Understanding of general recommendations for a balanced deficit meal plan, which promotes 1-2 lb weight loss per week and includes a negative energy balance of [id number] kcal/d;Understanding recommendations for meals to include 15-35% energy as protein, 25-35% energy from fat, 35-60% energy from carbohydrates, less than 276m of dietary cholesterol, 20-35 gm of total fiber daily;Understanding of distribution of calorie intake throughout the day with the consumption of 4-5 meals/snacks    Diabetes Yes    Intervention Provide education about signs/symptoms and action to take for hypo/hyperglycemia.;Provide education about proper nutrition, including hydration, and aerobic/resistive exercise prescription along with prescribed medications to achieve blood glucose in normal ranges: Fasting glucose 65-99 mg/dL    Expected Outcomes Short Term: Participant verbalizes understanding of the signs/symptoms and immediate care  of hyper/hypoglycemia, proper foot care and importance of medication, aerobic/resistive exercise and nutrition plan for blood glucose control.;Long Term: Attainment of HbA1C < 7%.    Heart Failure Yes    Intervention Provide a combined exercise and nutrition program that is supplemented with education, support and counseling about heart failure. Directed toward relieving symptoms such as shortness of breath, decreased exercise tolerance, and extremity edema.    Expected Outcomes Improve functional capacity of life;Short term: Attendance in program 2-3 days a week with increased exercise capacity. Reported lower sodium intake. Reported increased fruit and vegetable intake. Reports medication compliance.;Short term: Daily weights obtained and reported for increase. Utilizing diuretic protocols set by physician.;Long term: Adoption of self-care skills and reduction of barriers for early signs and symptoms recognition and intervention leading to self-care maintenance.    Hypertension Yes    Intervention Provide education on lifestyle modifcations including regular physical activity/exercise, weight management, moderate sodium restriction and increased consumption of fresh fruit, vegetables, and low fat dairy, alcohol moderation, and smoking cessation.;Monitor prescription use compliance.    Expected Outcomes Long Term: Maintenance of blood pressure at goal levels.;Short Term: Continued assessment and intervention until BP is < 140/977mHG in hypertensive participants. < 130/8076mG in hypertensive participants with diabetes, heart failure or chronic kidney disease.    Lipids Yes    Intervention Provide education and support for participant on nutrition & aerobic/resistive exercise along with prescribed medications to achieve LDL <74m43mDL >40mg65m Expected Outcomes Short Term: Participant states understanding of desired cholesterol values and is compliant with medications prescribed. Participant is following  exercise prescription and nutrition guidelines.;Long Term: Cholesterol controlled with medications as prescribed, with individualized exercise RX and with personalized nutrition plan. Value goals: LDL < 74mg,19m > 40 mg.           Education:Diabetes - Individual verbal and written instruction to review signs/symptoms of diabetes, desired ranges of glucose level fasting,  after meals and with exercise. Acknowledge that pre and post exercise glucose checks will be done for 3 sessions at entry of program.   Cardiac Rehab from 04/15/2020 in [facility] Cardiac and Pulmonary Rehab  Date 04/15/20  Educator [facility]  Instruction Review Code 1- Verbalizes Understanding      Education: Know Your Numbers and Risk Factors: -Group verbal and written instruction about important numbers in your health.  Discussion of what are risk factors and how they play a role in the disease process.  Review of Cholesterol, Blood Pressure, Diabetes, and BMI and the role they play in your overall health.   Core Components/Risk Factors/Patient Goals Review:    Core Components/Risk Factors/Patient Goals at Discharge (Final Review):    ITP Comments:  ITP Comments    Row Name 04/10/20 1504 04/15/20 1535 04/16/20 0846 04/22/20 1018 04/29/20 1521   ITP Comments Completed virtual orientation today.  EP evaluation is scheduled for Tuesday 5/18 at 1pm.  Documentation for diagnosis can be found in [facility] encounter 03/31/20. Completed 6MWT and gym orientation.  Initial ITP created and sent for review to Dr. Emily Filbert, Medical Director. 30 Day review completed. Medical Director review done, changes made as directed,and approval shown by signature of Medical Director. : First full day of exercise!  Patient was oriented to gym and equipment including functions, settings, policies, and procedures.  Patient's individual exercise prescription and treatment plan were reviewed.  All starting workloads were established based on the results of the 6  minute walk test done at initial orientation visit.  The plan for exercise progression was also introduced and progression will be customized based on patient's performance and goals. Rawley is having a cath/stent procedure done today and will be out the rest of the week.   [place] Name 05/13/20 1339 05/14/20 0615         ITP Comments [place] has not attended since last review. 30 Day review completed. Medical Director ITP review done, changes made as directed, and signed approval by Medical Director.             Comments: 30 Day review completed. Medical Director ITP review done, changes made as directed, and signed approval by Medical Director.

## 2020-05-20 DIAGNOSIS — E1165 Type 2 diabetes mellitus with hyperglycemia: Secondary | ICD-10-CM | POA: Diagnosis not present

## 2020-05-20 DIAGNOSIS — H4010X0 Unspecified open-angle glaucoma, stage unspecified: Secondary | ICD-10-CM | POA: Diagnosis not present

## 2020-05-20 DIAGNOSIS — I509 Heart failure, unspecified: Secondary | ICD-10-CM | POA: Diagnosis not present

## 2020-05-20 DIAGNOSIS — E1122 Type 2 diabetes mellitus with diabetic chronic kidney disease: Secondary | ICD-10-CM | POA: Diagnosis not present

## 2020-05-20 DIAGNOSIS — I110 Hypertensive heart disease with heart failure: Secondary | ICD-10-CM | POA: Diagnosis not present

## 2020-05-20 DIAGNOSIS — N182 Chronic kidney disease, stage 2 (mild): Secondary | ICD-10-CM | POA: Diagnosis not present

## 2020-05-20 DIAGNOSIS — F3112 Bipolar disorder, current episode manic without psychotic features, moderate: Secondary | ICD-10-CM | POA: Diagnosis not present

## 2020-05-20 DIAGNOSIS — N400 Enlarged prostate without lower urinary tract symptoms: Secondary | ICD-10-CM | POA: Diagnosis not present

## 2020-05-20 DIAGNOSIS — E782 Mixed hyperlipidemia: Secondary | ICD-10-CM | POA: Diagnosis not present

## 2020-05-26 ENCOUNTER — Encounter: Payer: Self-pay | Admitting: *Deleted

## 2020-05-26 ENCOUNTER — Telehealth: Payer: Self-pay | Admitting: *Deleted

## 2020-05-26 DIAGNOSIS — N182 Chronic kidney disease, stage 2 (mild): Secondary | ICD-10-CM | POA: Diagnosis not present

## 2020-05-26 DIAGNOSIS — I214 Non-ST elevation (NSTEMI) myocardial infarction: Secondary | ICD-10-CM

## 2020-05-26 DIAGNOSIS — Z794 Long term (current) use of insulin: Secondary | ICD-10-CM | POA: Diagnosis not present

## 2020-05-26 DIAGNOSIS — E08621 Diabetes mellitus due to underlying condition with foot ulcer: Secondary | ICD-10-CM | POA: Diagnosis not present

## 2020-05-26 DIAGNOSIS — L97421 Non-pressure chronic ulcer of left heel and midfoot limited to breakdown of skin: Secondary | ICD-10-CM | POA: Diagnosis not present

## 2020-05-26 DIAGNOSIS — R600 Localized edema: Secondary | ICD-10-CM | POA: Diagnosis not present

## 2020-05-26 DIAGNOSIS — E1122 Type 2 diabetes mellitus with diabetic chronic kidney disease: Secondary | ICD-10-CM | POA: Diagnosis not present

## 2020-05-26 NOTE — Telephone Encounter (Signed)
Called daughter, [name],  to check up on pt.  He was out with gout and swelling in his foot.  The gout has caused blisters and more swelling.  They are supposed to see doctor today about his foot and possible wound care.  He was finally able to get a shoe on this weekend. [name] will let us know what the doctor says and his plans going forward.  Also, let them know about the schedule change coming up starting next week.

## 2020-05-29 ENCOUNTER — Encounter: Payer: Medicare PPO | Attending: Cardiovascular Disease

## 2020-05-29 DIAGNOSIS — I214 Non-ST elevation (NSTEMI) myocardial infarction: Secondary | ICD-10-CM | POA: Insufficient documentation

## 2020-05-30 DIAGNOSIS — I10 Essential (primary) hypertension: Secondary | ICD-10-CM | POA: Diagnosis not present

## 2020-05-30 DIAGNOSIS — I2510 Atherosclerotic heart disease of native coronary artery without angina pectoris: Secondary | ICD-10-CM | POA: Diagnosis not present

## 2020-05-30 DIAGNOSIS — E782 Mixed hyperlipidemia: Secondary | ICD-10-CM | POA: Diagnosis not present

## 2020-05-30 DIAGNOSIS — I509 Heart failure, unspecified: Secondary | ICD-10-CM | POA: Diagnosis not present

## 2020-05-30 DIAGNOSIS — G4739 Other sleep apnea: Secondary | ICD-10-CM | POA: Diagnosis not present

## 2020-06-03 ENCOUNTER — Encounter: Payer: Medicare PPO | Admitting: *Deleted

## 2020-06-03 ENCOUNTER — Other Ambulatory Visit: Payer: Self-pay

## 2020-06-03 DIAGNOSIS — I214 Non-ST elevation (NSTEMI) myocardial infarction: Secondary | ICD-10-CM | POA: Diagnosis not present

## 2020-06-03 LAB — GLUCOSE, CAPILLARY
Glucose-Capillary: 103 mg/dL — ABNORMAL HIGH (ref 70–99)
Glucose-Capillary: 93 mg/dL (ref 70–99)

## 2020-06-03 NOTE — Progress Notes (Signed)
Daily Session Note  Patient Details  Name: Christian [name] MRN: [id number] Date of Birth: [birth year] Referring Provider:     Cardiac Rehab from 04/15/2020 in [facility] Cardiac and Pulmonary Rehab  Referring Provider Neoma Laming  MD      Encounter Date: 06/03/2020  Check In:  Session Check In - 06/03/20 1028      Check-In   Supervising physician immediately available to respond to emergencies See telemetry face sheet for immediately available ER MD    Location [facility]-Cardiac & Pulmonary Rehab    Staff Present Heath Lark, RN, BSN, Jacklynn Bue, MS Exercise Physiologist;Amanda Oletta Darter, [place], ACSM CEP, Exercise Physiologist    Virtual Visit No    Medication changes reported     No    Fall or balance concerns reported    No    Warm-up and Cool-down Performed on first and last piece of equipment    Resistance Training Performed Yes    VAD Patient? No    PAD/SET Patient? No      Pain Assessment   Currently in Pain? No/denies              Social History   Tobacco Use  Smoking Status Never Smoker  Smokeless Tobacco Never Used    Goals Met:  Exercise tolerated well No report of cardiac concerns or symptoms  Goals Unmet:  Not Applicable  Comments: Pt able to follow exercise prescription today without complaint.  Will continue to monitor for progression.    Dr. Emily Filbert is Medical Director for [place] [name].

## 2020-06-05 ENCOUNTER — Other Ambulatory Visit: Payer: Self-pay

## 2020-06-05 ENCOUNTER — Encounter: Payer: Medicare PPO | Admitting: *Deleted

## 2020-06-05 DIAGNOSIS — I214 Non-ST elevation (NSTEMI) myocardial infarction: Secondary | ICD-10-CM

## 2020-06-05 LAB — GLUCOSE, CAPILLARY
Glucose-Capillary: 170 mg/dL — ABNORMAL HIGH (ref 70–99)
Glucose-Capillary: 145 mg/dL — ABNORMAL HIGH (ref 70–99)

## 2020-06-05 NOTE — Progress Notes (Signed)
Daily Session Note  Patient Details  Name: Christian [name] MRN: [id number] Date of Birth: 23-Mar-1951 Referring Provider:     Cardiac Rehab from 04/15/2020 in [facility] Cardiac and Pulmonary Rehab  Referring Provider Neoma Laming  MD      Encounter Date: 06/05/2020  Check In:  Session Check In - 06/05/20 0951      Check-In   Supervising physician immediately available to respond to emergencies See telemetry face sheet for immediately available ER MD    Location [facility]-Cardiac & Pulmonary Rehab    Staff Present Heath Lark, RN, BSN, CCRP;Joseph Foy [place], [place], ACSM CEP, Exercise Physiologist    Virtual Visit No    Medication changes reported     No    Fall or balance concerns reported    No    Warm-up and Cool-down Performed on first and last piece of equipment    Resistance Training Performed Yes    VAD Patient? No    PAD/SET Patient? No              Social History   Tobacco Use  Smoking Status Never Smoker  Smokeless Tobacco Never Used    Goals Met:  Independence with exercise equipment Exercise tolerated well No report of cardiac concerns or symptoms  Goals Unmet:  Not Applicable  Comments: Pt able to follow exercise prescription today without complaint.  Will continue to monitor for progression.    Dr. Emily Filbert is Medical Director for [place] and [name].

## 2020-06-06 DIAGNOSIS — I10 Essential (primary) hypertension: Secondary | ICD-10-CM | POA: Diagnosis not present

## 2020-06-06 DIAGNOSIS — I480 Paroxysmal atrial fibrillation: Secondary | ICD-10-CM | POA: Diagnosis not present

## 2020-06-06 DIAGNOSIS — E11621 Type 2 diabetes mellitus with foot ulcer: Secondary | ICD-10-CM | POA: Diagnosis not present

## 2020-06-06 DIAGNOSIS — I700 Atherosclerosis of aorta: Secondary | ICD-10-CM | POA: Diagnosis not present

## 2020-06-06 DIAGNOSIS — N183 Chronic kidney disease, stage 3 unspecified: Secondary | ICD-10-CM | POA: Diagnosis not present

## 2020-06-06 DIAGNOSIS — I130 Hypertensive heart and chronic kidney disease with heart failure and stage 1 through stage 4 chronic kidney disease, or unspecified chronic kidney disease: Secondary | ICD-10-CM | POA: Diagnosis not present

## 2020-06-06 DIAGNOSIS — I5032 Chronic diastolic (congestive) heart failure: Secondary | ICD-10-CM | POA: Diagnosis not present

## 2020-06-06 DIAGNOSIS — L97522 Non-pressure chronic ulcer of other part of left foot with fat layer exposed: Secondary | ICD-10-CM | POA: Diagnosis not present

## 2020-06-06 DIAGNOSIS — E782 Mixed hyperlipidemia: Secondary | ICD-10-CM | POA: Diagnosis not present

## 2020-06-06 DIAGNOSIS — I2510 Atherosclerotic heart disease of native coronary artery without angina pectoris: Secondary | ICD-10-CM | POA: Diagnosis not present

## 2020-06-06 DIAGNOSIS — E1122 Type 2 diabetes mellitus with diabetic chronic kidney disease: Secondary | ICD-10-CM | POA: Diagnosis not present

## 2020-06-11 ENCOUNTER — Encounter: Payer: Self-pay | Admitting: *Deleted

## 2020-06-11 DIAGNOSIS — I214 Non-ST elevation (NSTEMI) myocardial infarction: Secondary | ICD-10-CM

## 2020-06-11 DIAGNOSIS — I2510 Atherosclerotic heart disease of native coronary artery without angina pectoris: Secondary | ICD-10-CM | POA: Diagnosis not present

## 2020-06-11 NOTE — Progress Notes (Signed)
Cardiac Individual Treatment Plan  Patient Details  Name: Christian [name] MRN: [id number] Date of Birth: September 21, 1951 Referring Provider:     Cardiac Rehab from 04/15/2020 in [facility] Cardiac and Pulmonary Rehab  Referring Provider Neoma Laming  MD      Initial Encounter Date:    Cardiac Rehab from 04/15/2020 in [facility] Cardiac and Pulmonary Rehab  Date 04/15/20      Visit Diagnosis: NSTEMI (non-ST elevated myocardial infarction) [facility])  Patient's Home Medications on Admission:  Current Outpatient Medications:  .  ACETAMINOPHEN EXTRA STRENGTH 500 MG tablet, Take 1,000 mg by mouth 2 (two) times daily., Disp: , Rfl:  .  amLODipine (NORVASC) 10 MG tablet, Take 1 tablet (10 mg total) by mouth daily., Disp: 30 tablet, Rfl: 0 .  ARIPiprazole (ABILIFY) 20 MG tablet, Take 1 tablet (20 mg total) by mouth daily., Disp: 30 tablet, Rfl: 0 .  aspirin EC 81 MG EC tablet, Take 1 tablet (81 mg total) by mouth daily., Disp: 30 tablet, Rfl: 0 .  atorvastatin (LIPITOR) 10 MG tablet, Take 1 tablet (10 mg total) by mouth daily at 6 PM. (Patient taking differently: Take 80 mg by mouth daily at 6 PM. ), Disp: 30 tablet, Rfl: 0 .  buPROPion (WELLBUTRIN) 75 MG tablet, Take 1 tablet (75 mg total) by mouth daily., Disp: 30 tablet, Rfl: 0 .  divalproex (DEPAKOTE) 500 MG DR tablet, Take 1 tablet (500 mg total) by mouth every 12 (twelve) hours., Disp: 60 tablet, Rfl: 0 .  furosemide (LASIX) 20 MG tablet, Take 1 tablet (20 mg total) by mouth daily., Disp: 30 tablet, Rfl: 0 .  glimepiride (AMARYL) 2 MG tablet, Take 2 mg by mouth daily with breakfast., Disp: , Rfl:  .  insulin glargine (LANTUS SOLOSTAR) 100 UNIT/ML Solostar Pen, daily as needed., Disp: , Rfl:  .  liothyronine (CYTOMEL) 25 MCG tablet, Take 25 mcg by mouth daily before breakfast. , Disp: , Rfl:  .  lisinopril (ZESTRIL) 40 MG tablet, Take 1 tablet (40 mg total) by mouth daily., Disp: 30 tablet, Rfl: 0 .  metoprolol succinate (TOPROL-XL) 50 MG 24 hr tablet, Take  1 tablet (50 mg total) by mouth daily. Take with or immediately following a meal. (Patient taking differently: Take 25 mg by mouth daily. Take with or immediately following a meal.), Disp: 60 tablet, Rfl: 3 .  mirtazapine (REMERON SOL-TAB) 15 MG disintegrating tablet, Take 1 tablet (15 mg total) by mouth at bedtime., Disp: 30 tablet, Rfl: 0 .  QUEtiapine (SEROQUEL) 25 MG tablet, Take 25 mg by mouth 2 (two) times daily. , Disp: , Rfl:  .  QUEtiapine (SEROQUEL) 300 MG tablet, Take 300 mg by mouth at bedtime., Disp: , Rfl:  .  rivaroxaban (XARELTO) 20 MG TABS tablet, Take 1 tablet (20 mg total) by mouth daily with supper., Disp: 30 tablet, Rfl: 0 .  sitaGLIPtin (JANUVIA) 25 MG tablet, Take 25 mg by mouth daily., Disp: , Rfl:  No current facility-administered medications for this visit.  Facility-Administered Medications Ordered in Other Visits:  .  ondansetron (ZOFRAN) injection 4 mg, 4 mg, Intravenous, Once PRN, Ronelle Nigh Jamse Mead, MD  Past Medical History: Past Medical History:  Diagnosis Date  . Bilateral lower extremity edema: chronic with venous stasis changes 06/02/2014  . Bipolar 1 disorder ([place])   . Bipolar disorder ([place])   . Chronic kidney disease   . Gout   . Gout   . Hx of blood clots   . Hypertension   .  Mood swings   . OSA on CPAP 06/02/2014  . Other and unspecified hyperlipidemia 06/02/2014  . Type II or unspecified type diabetes mellitus with unspecified complication, uncontrolled 06/02/2014    Tobacco Use: Social History   Tobacco Use  Smoking Status Never Smoker  Smokeless Tobacco Never Used    Labs: Recent Review Flowsheet Data    Labs for ITP Cardiac and Pulmonary Rehab Latest Ref Rng & Units 09/01/2015 12/28/2015 01/30/2020 03/17/2020 03/31/2020   Cholestrol 0 - 200 mg/dL - 108 - - -   LDLCALC 0 - 99 mg/dL - 63 - - -   HDL >40 mg/dL - 33(L) - - -   Trlycerides <150 mg/dL - 62 - - -   Hemoglobin A1c 4.8 - 5.6 % 7.5(H) - 7.1(H) - 8.7(H)   PHART 7.35 - 7.45 7.51(H) - - 7.40  -   PCO2ART 32 - 48 mmHg 27(L) - - 41 -   HCO3 20.0 - 28.0 mmol/L 21.5 - - 25.4 -   TCO2 0 - 100 mmol/L - - - - -   ACIDBASEDEF 0.0 - 2.0 mmol/L 0.3 - - - -   O2SAT % 98.2 - - 90.6 -       Exercise Target Goals: Exercise Program Goal: Individual exercise prescription set using results from initial 6 min walk test and THRR while considering  patient's activity barriers and safety.   Exercise Prescription Goal: Initial exercise prescription builds to 30-45 minutes a day of aerobic activity, 2-3 days per week.  Home exercise guidelines will be given to patient during program as part of exercise prescription that the participant will acknowledge.   Education: Aerobic Exercise & Resistance Training: - Gives group verbal and written instruction on the various components of exercise. Focuses on aerobic and resistive training programs and the benefits of this training and how to safely progress through these programs..   Education: Exercise & Equipment Safety: - Individual verbal instruction and demonstration of equipment use and safety with use of the equipment.   Cardiac Rehab from 04/15/2020 in [facility] Cardiac and Pulmonary Rehab  Date 04/15/20  Educator [facility]  Instruction Review Code 1- Verbalizes Understanding      Education: Exercise Physiology & General Exercise Guidelines: - Group verbal and written instruction with models to review the exercise physiology of the cardiovascular system and associated critical values. Provides general exercise guidelines with specific guidelines to those with heart or lung disease.    Education: Flexibility, Balance, Mind/Body Relaxation: Provides group verbal/written instruction on the benefits of flexibility and balance training, including mind/body exercise modes such as yoga, pilates and tai chi.  Demonstration and skill practice provided.   Activity Barriers & Risk Stratification:  Activity Barriers & Cardiac Risk Stratification - 04/15/20 1536        Activity Barriers & Cardiac Risk Stratification   Activity Barriers Balance Concerns;Assistive Device;Decreased Ventricular Function;Deconditioning;Muscular Weakness;Back Problems;Other (comment);Joint Problems    Comments occasional back pain, torn rotator cuff (bilateral)    Cardiac Risk Stratification High           6 Minute Walk:  6 Minute Walk    Row Name 04/15/20 1535         6 Minute Walk   Distance 847 feet     Walk Time 6 minutes     # of Rest Breaks 0     MPH 1.6     METS 2.26     RPE 11     Perceived Dyspnea  3  VO2 Peak 7.89     Symptoms Yes (comment)     Comments SOB, hip pain 5/10     Resting HR 86 bpm     Resting BP 146/72     Resting Oxygen Saturation  97 %     Exercise Oxygen Saturation  during 6 min walk 96 %     Max Ex. HR 125 bpm     Max Ex. BP 176/88     2 Minute Post BP 142/78            Oxygen Initial Assessment:   Oxygen Re-Evaluation:   Oxygen Discharge (Final Oxygen Re-Evaluation):   Initial Exercise Prescription:  Initial Exercise Prescription - 04/15/20 1500      Date of Initial Exercise RX and Referring Provider   Date 04/15/20    Referring Provider Neoma Laming  MD      Treadmill   MPH 1.4    Grade 0.5    Minutes 15    METs 2.17      NuStep   Level 1    SPM 80    Minutes 15    METs 2      T5 Nustep   Level 1    SPM 80    Minutes 15    METs 2      Biostep-RELP   Level 1    SPM 50    Minutes 15    METs 2      Prescription Details   Frequency (times per week) 2    Duration Progress to 30 minutes of continuous aerobic without signs/symptoms of physical distress      Intensity   THRR 40-80% of Max Heartrate 113-140    Ratings of Perceived Exertion 11-13    Perceived Dyspnea 0-4      Progression   Progression Continue to progress workloads to maintain intensity without signs/symptoms of physical distress.      Resistance Training   Training Prescription Yes    Weight 3 lb    Reps 10-15            Perform Capillary Blood Glucose checks as needed.  Exercise Prescription Changes:  Exercise Prescription Changes    Row Name 04/15/20 1500 04/29/20 1500 06/10/20 1200         Response to Exercise   Blood Pressure (Admit) 146/72 138/80 116/74     Blood Pressure (Exercise) 176/88 176/80 142/64     Blood Pressure (Exit) 142/78 128/60 142/74     Heart Rate (Admit) 86 bpm 108 bpm 105 bpm     Heart Rate (Exercise) 125 bpm 136 bpm 110 bpm     Heart Rate (Exit) 90 bpm 103 bpm 96 bpm     Oxygen Saturation (Admit) 97 % -- --     Oxygen Saturation (Exercise) 96 % -- --     Rating of Perceived Exertion (Exercise) '11 15 13     '$ Perceived Dyspnea (Exercise) 3 -- --     Symptoms SOB, hip pain 5/10, shuffling gait SOB --     Comments walk test results first full day of exercise second day back     Duration -- Progress to 30 minutes of  aerobic without signs/symptoms of physical distress Progress to 30 minutes of  aerobic without signs/symptoms of physical distress     Intensity -- THRR unchanged THRR unchanged       Progression   Progression -- Continue to progress workloads to maintain intensity without signs/symptoms of physical  distress. Continue to progress workloads to maintain intensity without signs/symptoms of physical distress.     Average METs -- 1.98 2.4       Resistance Training   Training Prescription -- Yes Yes     Weight -- 3 lb 3 lb     Reps -- 10-15 10-15       Interval Training   Interval Training -- No No       Treadmill   MPH -- 1.4 --     Grade -- 0.5 --     Minutes -- 15 --     METs -- 2.17 --       NuStep   Level -- -- 1     SPM -- -- 80     Minutes -- -- 15     METs -- -- 2.4       T5 Nustep   Level -- 1 --     Minutes -- 15 --     METs -- 1.8 --            Exercise Comments:  Exercise Comments    Row Name 04/22/20 1018           Exercise Comments : First full day of exercise!  Patient was oriented to gym and equipment including  functions, settings, policies, and procedures.  Patient's individual exercise prescription and treatment plan were reviewed.  All starting workloads were established based on the results of the 6 minute walk test done at initial orientation visit.  The plan for exercise progression was also introduced and progression will be customized based on patient's performance and goals.              Exercise Goals and Review:  Exercise Goals    Row Name 04/15/20 1538             Exercise Goals   Increase Physical Activity Yes       Intervention Provide advice, education, support and counseling about physical activity/exercise needs.;Develop an individualized exercise prescription for aerobic and resistive training based on initial evaluation findings, risk stratification, comorbidities and participant's personal goals.       Expected Outcomes Short Term: Attend rehab on a regular basis to increase amount of physical activity.;Long Term: Add in home exercise to make exercise part of routine and to increase amount of physical activity.;Long Term: Exercising regularly at least 3-5 days a week.       Increase Strength and Stamina Yes       Intervention Provide advice, education, support and counseling about physical activity/exercise needs.;Develop an individualized exercise prescription for aerobic and resistive training based on initial evaluation findings, risk stratification, comorbidities and participant's personal goals.       Expected Outcomes Short Term: Increase workloads from initial exercise prescription for resistance, speed, and METs.;Short Term: Perform resistance training exercises routinely during rehab and add in resistance training at home;Long Term: Improve cardiorespiratory fitness, muscular endurance and strength as measured by increased METs and functional capacity (6MWT)       Able to understand and use rate of perceived exertion (RPE) scale Yes       Intervention Provide education and  explanation on how to use RPE scale       Expected Outcomes Short Term: Able to use RPE daily in rehab to express subjective intensity level;Long Term:  Able to use RPE to guide intensity level when exercising independently       Able to understand and use Dyspnea  scale Yes       Intervention Provide education and explanation on how to use Dyspnea scale       Expected Outcomes Short Term: Able to use Dyspnea scale daily in rehab to express subjective sense of shortness of breath during exertion;Long Term: Able to use Dyspnea scale to guide intensity level when exercising independently       Knowledge and understanding of Target Heart Rate Range (THRR) Yes       Intervention Provide education and explanation of THRR including how the numbers were predicted and where they are located for reference       Expected Outcomes Short Term: Able to state/look up THRR;Short Term: Able to use daily as guideline for intensity in rehab;Long Term: Able to use THRR to govern intensity when exercising independently       Able to check pulse independently Yes       Intervention Provide education and demonstration on how to check pulse in carotid and radial arteries.;Review the importance of being able to check your own pulse for safety during independent exercise       Expected Outcomes Short Term: Able to explain why pulse checking is important during independent exercise;Long Term: Able to check pulse independently and accurately       Understanding of Exercise Prescription Yes       Intervention Provide education, explanation, and written materials on patient's individual exercise prescription       Expected Outcomes Short Term: Able to explain program exercise prescription;Long Term: Able to explain home exercise prescription to exercise independently              Exercise Goals Re-Evaluation :  Exercise Goals Re-Evaluation    Row Name 04/22/20 1018 04/29/20 1520 05/26/20 1221 06/05/20 1007 06/10/20 1210      Exercise Goal Re-Evaluation   Exercise Goals Review Knowledge and understanding of Target Heart Rate Range (THRR);Able to understand and use rate of perceived exertion (RPE) scale;Understanding of Exercise Prescription Increase Physical Activity;Increase Strength and Stamina;Understanding of Exercise Prescription -- Increase Physical Activity;Increase Strength and Stamina Increase Physical Activity;Increase Strength and Stamina;Able to understand and use rate of perceived exertion (RPE) scale   Comments Reviewed RPE and dyspnea scales, THR and program prescription with pt today.  Pt voiced understanding and was given a copy of goals to take home. Madden has only attended one session thus far and is having a cath/stent done today.  He will need clearance to return.  We will continue to monitor his progress. Out since last review with gout/blisters.  Waiting for clearance to return. Landrum has been doing a little walking and using his 2 pounds weights at home. He is starting back rehab since he had his stents put in two weeks ago. This is Kyden' first week back.  he has only done seated machines so far.  Staff will monitor progress.   Expected Outcomes Short: Use RPE daily to regulate intensity. Long: Follow program prescription in THR. Short: Clearance to return to rehab  Long: Continue to follow program prescription. -- Short: continue to attend [facility] regularly. Long: increase exercise loads and exerises at home. Short:  get back to regular attendance Long: increase stamina and MET level          Discharge Exercise Prescription (Final Exercise Prescription Changes):  Exercise Prescription Changes - 06/10/20 1200      Response to Exercise   Blood Pressure (Admit) 116/74    Blood Pressure (Exercise) 142/64  Blood Pressure (Exit) 142/74    Heart Rate (Admit) 105 bpm    Heart Rate (Exercise) 110 bpm    Heart Rate (Exit) 96 bpm    Rating of Perceived Exertion (Exercise) 13    Comments  second day back    Duration Progress to 30 minutes of  aerobic without signs/symptoms of physical distress    Intensity THRR unchanged      Progression   Progression Continue to progress workloads to maintain intensity without signs/symptoms of physical distress.    Average METs 2.4      Resistance Training   Training Prescription Yes    Weight 3 lb    Reps 10-15      Interval Training   Interval Training No      NuStep   Level 1    SPM 80    Minutes 15    METs 2.4           Nutrition:  Target Goals: Understanding of nutrition guidelines, daily intake of sodium '1500mg'$ , cholesterol '200mg'$ , calories 30% from fat and 7% or less from saturated fats, daily to have 5 or more servings of fruits and vegetables.  Education: Controlling Sodium/Reading Food Labels -Group verbal and written material supporting the discussion of sodium use in heart healthy nutrition. Review and explanation with models, verbal and written materials for utilization of the food label.   Education: General Nutrition Guidelines/Fats and Fiber: -Group instruction provided by verbal, written material, models and posters to present the general guidelines for heart healthy nutrition. Gives an explanation and review of dietary fats and fiber.   Biometrics:  Pre Biometrics - 04/15/20 1538      Pre Biometrics   Height 6' 0.9" (1.852 m)    Weight 298 lb 8 oz (135.4 kg)    BMI (Calculated) 39.48    Single Leg Stand 0 seconds            Nutrition Therapy Plan and Nutrition Goals:   Nutrition Assessments:  Nutrition Assessments - 04/15/20 1539      MEDFICTS Scores   Pre Score 38           MEDIFICTS Score Key:          ?70 Need to make dietary changes          40-70 Heart Healthy Diet         ? 40 Therapeutic Level Cholesterol Diet  Nutrition Goals Re-Evaluation:   Nutrition Goals Discharge (Final Nutrition Goals Re-Evaluation):   Psychosocial: Target Goals: Acknowledge presence or  absence of significant depression and/or stress, maximize coping skills, provide positive support system. Participant is able to verbalize types and ability to use techniques and skills needed for reducing stress and depression.   Education: Depression - Provides group verbal and written instruction on the correlation between heart/lung disease and depressed mood, treatment options, and the stigmas associated with seeking treatment.   Education: Sleep Hygiene -Provides group verbal and written instruction about how sleep can affect your health.  Define sleep hygiene, discuss sleep cycles and impact of sleep habits. Review good sleep hygiene tips.     Education: Stress and Anxiety: - Provides group verbal and written instruction about the health risks of elevated stress and causes of high stress.  Discuss the correlation between heart/lung disease and anxiety and treatment options. Review healthy ways to manage with stress and anxiety.    Initial Review & Psychosocial Screening:  Initial Psych Review & Screening - 04/10/20 1446  Initial Review   Current issues with Current Anxiety/Panic;History of Depression;Current Psychotropic Meds;Current Depression;Current Stress Concerns    Source of Stress Concerns Chronic Illness;Retirement/disability    Comments On disability and retired, Would like to have more mobility, long history of bipolar disorder but feels well managed currently and denies current symptoms.      Family Dynamics   Good Support System? Yes   ex-wife, daughter, and sister (8 brothers and sister) all local     Barriers   Psychosocial barriers to participate in program The patient should benefit from training in stress management and relaxation.;Psychosocial barriers identified (see note)      Screening Interventions   Interventions Encouraged to exercise;To provide support and resources with identified psychosocial needs;Provide feedback about the scores to participant     Expected Outcomes Long Term Goal: Stressors or current issues are controlled or eliminated.;Short Term goal: Utilizing psychosocial counselor, staff and physician to assist with identification of specific Stressors or current issues interfering with healing process. Setting desired goal for each stressor or current issue identified.;Short Term goal: Identification and review with participant of any Quality of Life or Depression concerns found by scoring the questionnaire.;Long Term goal: The participant improves quality of Life and PHQ9 Scores as seen by post scores and/or verbalization of changes           Quality of Life Scores:   Quality of Life - 04/15/20 1539      Quality of Life   Select Quality of Life      Quality of Life Scores   Health/Function Pre 18 %    Socioeconomic Pre 19 %    Psych/Spiritual Pre 18.86 %    Family Pre 25.5 %    GLOBAL Pre 19.35 %          Scores of 19 and below usually indicate a poorer quality of life in these areas.  A difference of  2-3 points is a clinically meaningful difference.  A difference of 2-3 points in the total score of the Quality of Life Index has been associated with significant improvement in overall quality of life, self-image, physical symptoms, and general health in studies assessing change in quality of life.  PHQ-9: Recent Review Flowsheet Data    Depression screen [facility] 2/9 04/15/2020 10/09/2015   Decreased Interest 0 1   Down, Depressed, Hopeless 0 1   PHQ - 2 Score 0 2   Altered sleeping 0 1   Tired, decreased energy 1 1   Change in appetite 2 2   Feeling bad or failure about yourself  1 1   Trouble concentrating 0 0   Moving slowly or fidgety/restless 0 1   Suicidal thoughts 0 0   PHQ-9 Score 4 8   Difficult doing work/chores Not difficult at all Not difficult at all     Interpretation of Total Score  Total Score Depression Severity:  1-4 = Minimal depression, 5-9 = Mild depression, 10-14 = Moderate depression,  15-19 = Moderately severe depression, 20-27 = Severe depression   Psychosocial Evaluation and Intervention:  Psychosocial Evaluation - 04/10/20 1453      Psychosocial Evaluation & Interventions   Interventions Stress management education;Encouraged to exercise with the program and follow exercise prescription    Comments Talan is coming into cardiac rehab after a NSTEMI.  He has a long history of bipolar disorder but is medically managed and feels good currently.  He is divorced but has a good relationship with his ex-wife. He also  is close to his daughter. They two of them will be sharing the task of getting him to rehab each day.  He is also one of 9 brother and sisters all of which, minus 2, leave within the county.  He is hoping that rehab will supplement his PT/OT and help improve his mobility.  He is also looking foward to getting out to meet people again.   Exercise will help with mood and strengthen for him.    Expected Outcomes Short: Attend rehab regularly for mood boost and strength Long: Continued compliance on therapy and stable mood.    Continue Psychosocial Services  Follow up required by staff           Psychosocial Re-Evaluation:  Psychosocial Re-Evaluation    [place] Name 06/05/20 1012             Psychosocial Re-Evaluation   Current issues with History of Depression;Current Depression;Current Psychotropic Meds;Current Stress Concerns       Comments Daimien is feeling positive about his health and looks to his daughter, ex wife and other family memebers for support.       Expected Outcomes Short: continue exercise to reduce stress. Long: maintain a positive outlook on his health independently.       Interventions Encouraged to attend Cardiac Rehabilitation for the exercise       Continue Psychosocial Services  Follow up required by staff              Psychosocial Discharge (Final Psychosocial Re-Evaluation):  Psychosocial Re-Evaluation - 06/05/20 1012       Psychosocial Re-Evaluation   Current issues with History of Depression;Current Depression;Current Psychotropic Meds;Current Stress Concerns    Comments Dione is feeling positive about his health and looks to his daughter, ex wife and other family memebers for support.    Expected Outcomes Short: continue exercise to reduce stress. Long: maintain a positive outlook on his health independently.    Interventions Encouraged to attend Cardiac Rehabilitation for the exercise    Continue Psychosocial Services  Follow up required by staff           Vocational Rehabilitation: Provide vocational rehab assistance to qualifying candidates.   Vocational Rehab Evaluation & Intervention:  Vocational Rehab - 04/10/20 1446      Initial Vocational Rehab Evaluation & Intervention   Assessment shows need for Vocational Rehabilitation No   disabled/retired          Education: Education Goals: Education classes will be provided on a variety of topics geared toward better understanding of heart health and risk factor modification. Participant will state understanding/return demonstration of topics presented as noted by education test scores.  Learning Barriers/Preferences:  Learning Barriers/Preferences - 04/10/20 1445      Learning Barriers/Preferences   Learning Barriers Sight   glasss   Learning Preferences None           General Cardiac Education Topics:  AED/CPR: - Group verbal and written instruction with the use of models to demonstrate the basic use of the AED with the basic ABC's of resuscitation.   Anatomy & Physiology of the Heart: - Group verbal and written instruction and models provide basic cardiac anatomy and physiology, with the coronary electrical and arterial systems. Review of Valvular disease and Heart Failure   Cardiac Procedures: - Group verbal and written instruction to review commonly prescribed medications for heart disease. Reviews the medication, class of the  drug, and side effects. Includes the steps to properly store meds and  maintain the prescription regimen. (beta blockers and nitrates)   Cardiac Medications I: - Group verbal and written instruction to review commonly prescribed medications for heart disease. Reviews the medication, class of the drug, and side effects. Includes the steps to properly store meds and maintain the prescription regimen.   Cardiac Medications II: -Group verbal and written instruction to review commonly prescribed medications for heart disease. Reviews the medication, class of the drug, and side effects. (all other drug classes)    Go Sex-Intimacy & Heart Disease, Get SMART - Goal Setting: - Group verbal and written instruction through game format to discuss heart disease and the return to sexual intimacy. Provides group verbal and written material to discuss and apply goal setting through the application of the S.M.A.R.T. Method.   Other Matters of the Heart: - Provides group verbal, written materials and models to describe Stable Angina and Peripheral Artery. Includes description of the disease process and treatment options available to the cardiac patient.   Infection Prevention: - Provides verbal and written material to individual with discussion of infection control including proper hand washing and proper equipment cleaning during exercise session.   Cardiac Rehab from 04/15/2020 in [facility] Cardiac and Pulmonary Rehab  Date 04/15/20  Educator [facility]  Instruction Review Code 1- Verbalizes Understanding      Falls Prevention: - Provides verbal and written material to individual with discussion of falls prevention and safety.   Cardiac Rehab from 04/15/2020 in [facility] Cardiac and Pulmonary Rehab  Date 04/15/20  Educator [facility]  Instruction Review Code 1- Verbalizes Understanding      Other: -Provides group and verbal instruction on various topics (see comments)   Knowledge Questionnaire Score:  Knowledge  Questionnaire Score - 04/15/20 1539      Knowledge Questionnaire Score   Pre Score 23/26 Education Focus: Angina, Nutrition, Exercise           Core Components/Risk Factors/Patient Goals at Admission:  Personal Goals and Risk Factors at Admission - 04/15/20 1540      Core Components/Risk Factors/Patient Goals on Admission    Weight Management Yes;Weight Loss;Obesity    Intervention Weight Management: Develop a combined nutrition and exercise program designed to reach desired caloric intake, while maintaining appropriate intake of nutrient and fiber, sodium and fats, and appropriate energy expenditure required for the weight goal.;Weight Management: Provide education and appropriate resources to help participant work on and attain dietary goals.;Weight Management/Obesity: Establish reasonable short term and long term weight goals.;Obesity: Provide education and appropriate resources to help participant work on and attain dietary goals.    Admit Weight 298 lb 8 oz (135.4 kg)    Goal Weight: Short Term 293 lb (132.9 kg)    Goal Weight: Long Term 288 lb (130.6 kg)    Expected Outcomes Short Term: Continue to assess and modify interventions until short term weight is achieved;Long Term: Adherence to nutrition and physical activity/exercise program aimed toward attainment of established weight goal;Weight Loss: Understanding of general recommendations for a balanced deficit meal plan, which promotes 1-2 lb weight loss per week and includes a negative energy balance of [phone] kcal/d;Understanding recommendations for meals to include 15-35% energy as protein, 25-35% energy from fat, 35-60% energy from carbohydrates, less than '200mg'$  of dietary cholesterol, 20-35 gm of total fiber daily;Understanding of distribution of calorie intake throughout the day with the consumption of 4-5 meals/snacks    Diabetes Yes    Intervention Provide education about signs/symptoms and action to take for  hypo/hyperglycemia.;Provide education about proper nutrition,  including hydration, and aerobic/resistive exercise prescription along with prescribed medications to achieve blood glucose in normal ranges: Fasting glucose 65-99 mg/dL    Expected Outcomes Short Term: Participant verbalizes understanding of the signs/symptoms and immediate care of hyper/hypoglycemia, proper foot care and importance of medication, aerobic/resistive exercise and nutrition plan for blood glucose control.;Long Term: Attainment of HbA1C < 7%.    Heart Failure Yes    Intervention Provide a combined exercise and nutrition program that is supplemented with education, support and counseling about heart failure. Directed toward relieving symptoms such as shortness of breath, decreased exercise tolerance, and extremity edema.    Expected Outcomes Improve functional capacity of life;Short term: Attendance in program 2-3 days a week with increased exercise capacity. Reported lower sodium intake. Reported increased fruit and vegetable intake. Reports medication compliance.;Short term: Daily weights obtained and reported for increase. Utilizing diuretic protocols set by physician.;Long term: Adoption of self-care skills and reduction of barriers for early signs and symptoms recognition and intervention leading to self-care maintenance.    Hypertension Yes    Intervention Provide education on lifestyle modifcations including regular physical activity/exercise, weight management, moderate sodium restriction and increased consumption of fresh fruit, vegetables, and low fat dairy, alcohol moderation, and smoking cessation.;Monitor prescription use compliance.    Expected Outcomes Long Term: Maintenance of blood pressure at goal levels.;Short Term: Continued assessment and intervention until BP is < 140/55m HG in hypertensive participants. < 130/875mHG in hypertensive participants with diabetes, heart failure or chronic kidney disease.    Lipids  Yes    Intervention Provide education and support for participant on nutrition & aerobic/resistive exercise along with prescribed medications to achieve LDL '70mg'$ , HDL >'40mg'$ .    Expected Outcomes Short Term: Participant states understanding of desired cholesterol values and is compliant with medications prescribed. Participant is following exercise prescription and nutrition guidelines.;Long Term: Cholesterol controlled with medications as prescribed, with individualized exercise RX and with personalized nutrition plan. Value goals: LDL < '70mg'$ , HDL > 40 mg.           Education:Diabetes - Individual verbal and written instruction to review signs/symptoms of diabetes, desired ranges of glucose level fasting, after meals and with exercise. Acknowledge that pre and post exercise glucose checks will be done for 3 sessions at entry of program.   [facility]  Date 04/15/20  [facility]- [facility]      Education: Know Your Numbers and Risk Factors: -Group verbal and written instruction about important numbers in your health.  Discussion of what are risk factors and how they play a role in the disease process.  Review of Cholesterol, Blood Pressure, Diabetes, and BMI and the role they play in your overall health.   Core Components/Risk Factors/Patient Goals Review:   Goals and Risk Factor Review    Row Name 06/05/20 1010             Core Components/Risk Factors/Patient Goals Review   Personal Goals Review Weight Management/Obesity;Diabetes;Lipids;Hypertension;Heart Failure       Review ChLinkas lost weight since the start of the program. He is working on losing more weight and controlling his sugar. His sugar was low a few times he was in rehab and has since been getting it more under control. He is checking his glucose at home twice a day.       Expected Outcomes Short: lose weight and improve  blood sugar levels. long: maintain weight  loss and blood sugars independently              Core Components/Risk Factors/Patient Goals at Discharge (Final Review):   Goals and Risk Factor Review - 06/05/20 1010      Core Components/Risk Factors/Patient Goals Review   Personal Goals Review Weight Management/Obesity;Diabetes;Lipids;Hypertension;Heart Failure    Review Brannen has lost weight since the start of the program. He is working on losing more weight and controlling his sugar. His sugar was low a few times he was in rehab and has since been getting it more under control. He is checking his glucose at home twice a day.    Expected Outcomes Short: lose weight and improve blood sugar levels. long: maintain weight loss and blood sugars independently           ITP Comments:  ITP Comments    Row Name 04/10/20 1504 04/15/20 1535 04/16/20 0846 04/22/20 1018 04/29/20 1521   ITP Comments Completed virtual orientation today.  EP evaluation is scheduled for Tuesday 5/18 at 1pm.  Documentation for diagnosis can be found in [facility] encounter 03/31/20. Completed 6MWT and gym orientation.  Initial ITP created and sent for review to Dr. Emily Filbert, Medical Director. 30 Day review completed. Medical Director review done, changes made as directed,and approval shown by signature of Medical Director. : First full day of exercise!  Patient was oriented to gym and equipment including functions, settings, policies, and procedures.  Patient's individual exercise prescription and treatment plan were reviewed.  All starting workloads were established based on the results of the 6 minute walk test done at initial orientation visit.  The plan for exercise progression was also introduced and progression will be customized based on patient's performance and goals. Timoteo is having a cath/stent procedure done today and will be out the rest of the week.   [place] Name 05/13/20 1339 05/14/20 0615 05/26/20 1221 06/11/20 1037      ITP Comments [place] has not attended since last review. 30 Day review completed. Medical Director ITP review done, changes made as directed, and signed approval by Medical Director. Called daughter, Sharl Ma,  to check up on pt.  He was out with gout and swelling in his foot.  The gout has caused blisters and more swelling.  They are supposed to see doctor today about his foot and possible wound care.  He was finally able to get a shoe on this weekend. Sharl Ma will let us know what the doctor says and his plans going forward.  Also, let them know about the schedule change coming up starting next week. 30 Day review completed. Medical Director ITP review done, changes made as directed, and signed approval by Medical Director.           Comments:

## 2020-06-12 ENCOUNTER — Encounter: Payer: Medicare PPO | Admitting: *Deleted

## 2020-06-12 ENCOUNTER — Other Ambulatory Visit: Payer: Self-pay

## 2020-06-12 DIAGNOSIS — I214 Non-ST elevation (NSTEMI) myocardial infarction: Secondary | ICD-10-CM | POA: Diagnosis not present

## 2020-06-12 NOTE — Progress Notes (Signed)
Daily Session Note  Patient Details  Name: Christian [name] MRN: [id number] Date of Birth: [birth year] Referring Provider:     Cardiac Rehab from 04/15/2020 in [facility] Cardiac and Pulmonary Rehab  Referring Provider Christian Laming  MD      Encounter Date: 06/12/2020  Check In:  Session Check In - 06/12/20 0954      Check-In   Supervising physician immediately available to respond to emergencies See telemetry face sheet for immediately available ER MD    Location [facility]-Cardiac & Pulmonary Rehab    Staff Present Christian Papa, RN BSN;Christian [name] RDN, Christian [name], BA, ACSM CEP, Exercise Physiologist    Virtual Visit No    Medication changes reported     No    Fall or balance concerns reported    No    Warm-up and Cool-down Performed on first and last piece of equipment    Resistance Training Performed Yes    VAD Patient? No    PAD/SET Patient? No      Pain Assessment   Currently in Pain? No/denies              Social History   Tobacco Use  Smoking Status Never Smoker  Smokeless Tobacco Never Used    Goals Met:  Independence with exercise equipment Exercise tolerated well No report of cardiac concerns or symptoms Strength training completed today  Goals Unmet:  Not Applicable  Comments: Pt able to follow exercise prescription today without complaint.  Will continue to monitor for progression.    Dr. Emily [name] is Medical Director for [name] and [name].

## 2020-06-17 ENCOUNTER — Other Ambulatory Visit: Payer: Self-pay

## 2020-06-17 ENCOUNTER — Encounter: Payer: Medicare PPO | Admitting: *Deleted

## 2020-06-17 DIAGNOSIS — E782 Mixed hyperlipidemia: Secondary | ICD-10-CM | POA: Diagnosis not present

## 2020-06-17 DIAGNOSIS — E1122 Type 2 diabetes mellitus with diabetic chronic kidney disease: Secondary | ICD-10-CM | POA: Diagnosis not present

## 2020-06-17 DIAGNOSIS — I214 Non-ST elevation (NSTEMI) myocardial infarction: Secondary | ICD-10-CM | POA: Diagnosis not present

## 2020-06-17 DIAGNOSIS — E039 Hypothyroidism, unspecified: Secondary | ICD-10-CM | POA: Diagnosis not present

## 2020-06-17 DIAGNOSIS — N183 Chronic kidney disease, stage 3 unspecified: Secondary | ICD-10-CM | POA: Diagnosis not present

## 2020-06-17 DIAGNOSIS — Z125 Encounter for screening for malignant neoplasm of prostate: Secondary | ICD-10-CM | POA: Diagnosis not present

## 2020-06-17 DIAGNOSIS — Z0000 Encounter for general adult medical examination without abnormal findings: Secondary | ICD-10-CM | POA: Diagnosis not present

## 2020-06-17 DIAGNOSIS — I700 Atherosclerosis of aorta: Secondary | ICD-10-CM | POA: Diagnosis not present

## 2020-06-17 DIAGNOSIS — I110 Hypertensive heart disease with heart failure: Secondary | ICD-10-CM | POA: Diagnosis not present

## 2020-06-17 DIAGNOSIS — I509 Heart failure, unspecified: Secondary | ICD-10-CM | POA: Diagnosis not present

## 2020-06-17 NOTE — Progress Notes (Signed)
Daily Session Note  Patient Details  Name: JAMESYN LINDELL MRN: [id number] Date of Birth: [birth year] Referring Provider:     Cardiac Rehab from 04/15/2020 in [facility] Cardiac and Pulmonary Rehab  Referring Provider Neoma Laming  MD      Encounter Date: 06/17/2020  Check In:  Session Check In - 06/17/20 1018      Check-In   Supervising physician immediately available to respond to emergencies See telemetry face sheet for immediately available ER MD    Location [facility]-Cardiac & Pulmonary Rehab    Staff Present Nyoka Cowden, RN, BSN, Tyna Jaksch, MS Exercise Physiologist;Amanda Oletta Darter, [place], ACSM CEP, Exercise Physiologist    Virtual Visit No    Fall or balance concerns reported    No    Tobacco Cessation No Change    Warm-up and Cool-down Performed on first and last piece of equipment    Resistance Training Performed No    VAD Patient? No    PAD/SET Patient? No      Pain Assessment   Currently in Pain? No/denies              Social History   Tobacco Use  Smoking Status Never Smoker  Smokeless Tobacco Never Used    Goals Met:  Independence with exercise equipment No report of cardiac concerns or symptoms Strength training completed today  Goals Unmet:  Not Applicable  Comments: Pt able to follow exercise prescription today without complaint.  Will continue to monitor for progression. After 10 minutes on treadmill, pt experienced leg cramps "I haven't walked that much in a while". Exercise stopped, VSS. Pt sat down until resistance which he tolerated without difficulty.  Dr. Emily Filbert is Medical Director for [place] and [name].

## 2020-06-19 ENCOUNTER — Other Ambulatory Visit: Payer: Self-pay

## 2020-06-19 ENCOUNTER — Encounter: Payer: Medicare PPO | Admitting: *Deleted

## 2020-06-19 DIAGNOSIS — E1122 Type 2 diabetes mellitus with diabetic chronic kidney disease: Secondary | ICD-10-CM | POA: Diagnosis not present

## 2020-06-19 DIAGNOSIS — E11621 Type 2 diabetes mellitus with foot ulcer: Secondary | ICD-10-CM | POA: Diagnosis not present

## 2020-06-19 DIAGNOSIS — F3131 Bipolar disorder, current episode depressed, mild: Secondary | ICD-10-CM | POA: Diagnosis not present

## 2020-06-19 DIAGNOSIS — I509 Heart failure, unspecified: Secondary | ICD-10-CM | POA: Diagnosis not present

## 2020-06-19 DIAGNOSIS — I214 Non-ST elevation (NSTEMI) myocardial infarction: Secondary | ICD-10-CM | POA: Diagnosis not present

## 2020-06-19 DIAGNOSIS — E039 Hypothyroidism, unspecified: Secondary | ICD-10-CM | POA: Diagnosis not present

## 2020-06-19 DIAGNOSIS — E782 Mixed hyperlipidemia: Secondary | ICD-10-CM | POA: Diagnosis not present

## 2020-06-19 DIAGNOSIS — N182 Chronic kidney disease, stage 2 (mild): Secondary | ICD-10-CM | POA: Diagnosis not present

## 2020-06-19 DIAGNOSIS — I110 Hypertensive heart disease with heart failure: Secondary | ICD-10-CM | POA: Diagnosis not present

## 2020-06-19 DIAGNOSIS — E1165 Type 2 diabetes mellitus with hyperglycemia: Secondary | ICD-10-CM | POA: Diagnosis not present

## 2020-06-19 NOTE — Progress Notes (Signed)
Daily Session Note  Patient Details  Name: Christian [name] MRN: [id number] Date of Birth: [birth year] Referring Provider:     Cardiac Rehab from 04/15/2020 in [facility] Cardiac and Pulmonary Rehab  Referring Provider Neoma Laming  MD      Encounter Date: 06/19/2020  Check In:  Session Check In - 06/19/20 1024      Check-In   Supervising physician immediately available to respond to emergencies See telemetry face sheet for immediately available ER MD    Location [facility]-Cardiac & Pulmonary Rehab    Staff Present Renita Papa, RN Margurite Auerbach, MS Exercise Physiologist;Amanda Oletta Darter, BA, ACSM CEP, Exercise Physiologist;Jessica Luan Pulling, MA, RCEP, CCRP, CCET    Virtual Visit No    Medication changes reported     No    Fall or balance concerns reported    No    Warm-up and Cool-down Performed on first and last piece of equipment    Resistance Training Performed Yes    VAD Patient? No    PAD/SET Patient? No      Pain Assessment   Currently in Pain? No/denies              Social History   Tobacco Use  Smoking Status Never Smoker  Smokeless Tobacco Never Used    Goals Met:  Independence with exercise equipment Exercise tolerated well No report of cardiac concerns or symptoms Strength training completed today  Goals Unmet:  Not Applicable  Comments: Pt able to follow exercise prescription today without complaint.  Will continue to monitor for progression.    Dr. Emily Filbert is Medical Director for [place] and [name].

## 2020-06-20 ENCOUNTER — Other Ambulatory Visit: Payer: Self-pay

## 2020-06-20 ENCOUNTER — Ambulatory Visit: Payer: Medicare PPO | Admitting: Podiatry

## 2020-06-20 ENCOUNTER — Encounter: Payer: Self-pay | Admitting: Podiatry

## 2020-06-20 DIAGNOSIS — M79675 Pain in left toe(s): Secondary | ICD-10-CM

## 2020-06-20 DIAGNOSIS — L97521 Non-pressure chronic ulcer of other part of left foot limited to breakdown of skin: Secondary | ICD-10-CM

## 2020-06-20 DIAGNOSIS — M79674 Pain in right toe(s): Secondary | ICD-10-CM

## 2020-06-20 DIAGNOSIS — L97511 Non-pressure chronic ulcer of other part of right foot limited to breakdown of skin: Secondary | ICD-10-CM | POA: Diagnosis not present

## 2020-06-20 DIAGNOSIS — I214 Non-ST elevation (NSTEMI) myocardial infarction: Secondary | ICD-10-CM

## 2020-06-20 DIAGNOSIS — E1149 Type 2 diabetes mellitus with other diabetic neurological complication: Secondary | ICD-10-CM

## 2020-06-20 DIAGNOSIS — B351 Tinea unguium: Secondary | ICD-10-CM | POA: Diagnosis not present

## 2020-06-20 NOTE — Progress Notes (Signed)
Completed initial RD evaluation 

## 2020-06-22 NOTE — Progress Notes (Signed)
Subjective:   Patient ID: Christian [name], male   DOB: 69 y.o.   MRN: [id number]   HPI 69 year old male presents the office with his daughter for concerns of wounds on both of his feet.  He developed swelling which resulted in ulcers to his left foot.  He did see his primary care physician home health care has been ordered for him but unfortunately not able to come out any longer without further orders it seems.  Indicating ointment onto the wounds daily but not sure what the appointment is.  He is diabetic and his last A1c last week was 6.9 which is down from 8.7 on May 3.  These wounds been ongoing left foot now for the last several months but most recently on the right second toe.  Compared to pictures that the daughter showed me the wounds are much improved but they want to have the feet checked and also have the nails trimmed.   Review of Systems  All other systems reviewed and are negative.  Past Medical History:  Diagnosis Date  . Bilateral lower extremity edema: chronic with venous stasis changes 06/02/2014  . Bipolar 1 disorder (HCC)   . Bipolar disorder (HCC)   . Chronic kidney disease   . Gout   . Gout   . Hx of blood clots   . Hypertension   . Mood swings   . OSA on CPAP 06/02/2014  . Other and unspecified hyperlipidemia 06/02/2014  . Type II or unspecified type diabetes mellitus with unspecified complication, uncontrolled 06/02/2014    Past Surgical History:  Procedure Laterality Date  . LEFT HEART CATH AND CORONARY ANGIOGRAPHY Left 04/01/2020   Procedure: LEFT HEART CATH AND CORONARY ANGIOGRAPHY;  Surgeon: Laurier Nancy, MD;  Location: [facility] INVASIVE CV LAB;  Service: Cardiovascular;  Laterality: Left;     Current Outpatient Medications:  .  ACETAMINOPHEN EXTRA STRENGTH 500 MG tablet, Take 1,000 mg by mouth 2 (two) times daily., Disp: , Rfl:  .  amLODipine (NORVASC) 10 MG tablet, Take 1 tablet (10 mg total) by mouth daily., Disp: 30 tablet, Rfl: 0 .  ARIPiprazole (ABILIFY)  20 MG tablet, Take 1 tablet (20 mg total) by mouth daily., Disp: 30 tablet, Rfl: 0 .  aspirin EC 81 MG EC tablet, Take 1 tablet (81 mg total) by mouth daily., Disp: 30 tablet, Rfl: 0 .  atorvastatin (LIPITOR) 10 MG tablet, Take 1 tablet (10 mg total) by mouth daily at 6 PM. (Patient taking differently: Take 80 mg by mouth daily at 6 PM. ), Disp: 30 tablet, Rfl: 0 .  buPROPion (WELLBUTRIN) 75 MG tablet, Take 1 tablet (75 mg total) by mouth daily., Disp: 30 tablet, Rfl: 0 .  divalproex (DEPAKOTE) 500 MG DR tablet, Take 1 tablet (500 mg total) by mouth every 12 (twelve) hours., Disp: 60 tablet, Rfl: 0 .  furosemide (LASIX) 20 MG tablet, Take 1 tablet (20 mg total) by mouth daily., Disp: 30 tablet, Rfl: 0 .  glimepiride (AMARYL) 2 MG tablet, Take 2 mg by mouth daily with breakfast., Disp: , Rfl:  .  insulin glargine (LANTUS SOLOSTAR) 100 UNIT/ML Solostar Pen, daily as needed., Disp: , Rfl:  .  liothyronine (CYTOMEL) 25 MCG tablet, Take 25 mcg by mouth daily before breakfast. , Disp: , Rfl:  .  lisinopril (ZESTRIL) 40 MG tablet, Take 1 tablet (40 mg total) by mouth daily., Disp: 30 tablet, Rfl: 0 .  metoprolol succinate (TOPROL-XL) 50 MG 24 hr tablet, Take 1 tablet (  50 mg total) by mouth daily. Take with or immediately following a meal. (Patient taking differently: Take 25 mg by mouth daily. Take with or immediately following a meal.), Disp: 60 tablet, Rfl: 3 .  mirtazapine (REMERON SOL-TAB) 15 MG disintegrating tablet, Take 1 tablet (15 mg total) by mouth at bedtime., Disp: 30 tablet, Rfl: 0 .  QUEtiapine (SEROQUEL) 25 MG tablet, Take 25 mg by mouth 2 (two) times daily. , Disp: , Rfl:  .  QUEtiapine (SEROQUEL) 300 MG tablet, Take 300 mg by mouth at bedtime., Disp: , Rfl:  .  rivaroxaban (XARELTO) 20 MG TABS tablet, Take 1 tablet (20 mg total) by mouth daily with supper., Disp: 30 tablet, Rfl: 0 .  sitaGLIPtin (JANUVIA) 25 MG tablet, Take 25 mg by mouth daily., Disp: , Rfl:  No current  facility-administered medications for this visit.  Facility-Administered Medications Ordered in Other Visits:  .  ondansetron (ZOFRAN) injection 4 mg, 4 mg, Intravenous, Once PRN, Henrene Hawking, Lum Keas, MD  Allergies  Allergen Reactions  . Zyprexa [Olanzapine] Other (See Comments)    Allergy noted by ACT Team. No reaction specified (??)         Objective:  Physical Exam  General: AAO x3, NAD  Dermatological: Nails are hypertrophic, dystrophic, brittle, discolored, elongated 10. No surrounding redness or drainage. Tenderness nails 1-5 bilaterally.  On the dorsal aspect the left foot is a wound measure approximately 2 x 2 cm and superficial with a granular wound base and mild surrounding macerated tissue.  There are scabs present on the dorsal aspect of foot, dry skin from where the wound otherwise is healed.  On the dorsal right second toe superficial area skin breakdown lateral tissues, clear drainage but there is no purulence.  Vascular: Dorsalis Pedis artery and Posterior Tibial artery pedal pulses are decreased bilateral with immedate capillary fill time. There is no pain with calf compression, swelling, warmth, erythema.   Neruologic: Sensation decreased with Semmes Weinstein monofilament to the digits.  Musculoskeletal: Hammertoes are present.    Assessment:   69 year old male ulcerations bilaterally, symptomatic onychomycosis     Plan:  -Treatment options discussed including all alternatives, risks, and complications -Etiology of symptoms were discussed -Nails debrided 10 without complications or bleeding. -In regards to the wounds we will switch to using Iodosorb dressing changes daily.  We will try to order home health care for him as well.  I would recommend offloading with surgical shoe which was dispensed today as well.  Gross elevation. -Daily foot inspection  Return in about 1 week (around 06/27/2020).   Ovid Curd, DPM

## 2020-06-23 ENCOUNTER — Telehealth: Payer: Self-pay | Admitting: *Deleted

## 2020-06-23 DIAGNOSIS — L97511 Non-pressure chronic ulcer of other part of right foot limited to breakdown of skin: Secondary | ICD-10-CM

## 2020-06-23 DIAGNOSIS — R0989 Other specified symptoms and signs involving the circulatory and respiratory systems: Secondary | ICD-10-CM

## 2020-06-23 DIAGNOSIS — L97521 Non-pressure chronic ulcer of other part of left foot limited to breakdown of skin: Secondary | ICD-10-CM

## 2020-06-23 NOTE — Telephone Encounter (Signed)
Faxed doppler orders to [facility]. Faxed home health care orders, SnapShot with clinicals and demographics to [facility].

## 2020-06-23 NOTE — Telephone Encounter (Signed)
-----   Message from Vivi Barrack, DPM sent at 06/20/2020  2:59 PM EDT ----- Can you please order arterial studies?  Also can we try to get Christian [name] ordered? Clean wounds with saline then apply iodosorb on the wounds b/l followed by DSD

## 2020-06-24 ENCOUNTER — Encounter: Payer: Medicare PPO | Admitting: *Deleted

## 2020-06-24 ENCOUNTER — Other Ambulatory Visit: Payer: Self-pay

## 2020-06-24 DIAGNOSIS — I214 Non-ST elevation (NSTEMI) myocardial infarction: Secondary | ICD-10-CM

## 2020-06-24 NOTE — Progress Notes (Signed)
Daily Session Note  Patient Details  Name: Christian [name] MRN: [id number] Date of Birth: 1951-05-29 Referring Provider:     Cardiac Rehab from 04/15/2020 in [facility] Cardiac and Pulmonary Rehab  Referring Provider Neoma Laming  MD      Encounter Date: 06/24/2020  Check In:  Session Check In - 06/24/20 1013      Check-In   Supervising physician immediately available to respond to emergencies See telemetry face sheet for immediately available ER MD    Location [facility]-Cardiac & Pulmonary Rehab    Staff Present Heath Lark, RN, BSN, CCRP;Melissa Hernando RDN, Rowe Pavy, BA, ACSM CEP, Exercise Physiologist    Virtual Visit No    Medication changes reported     No    Fall or balance concerns reported    No    Warm-up and Cool-down Performed on first and last piece of equipment    Resistance Training Performed Yes    VAD Patient? No    PAD/SET Patient? No      Pain Assessment   Currently in Pain? No/denies              Social History   Tobacco Use  Smoking Status Never Smoker  Smokeless Tobacco Never Used    Goals Met:  Independence with exercise equipment Exercise tolerated well No report of cardiac concerns or symptoms  Goals Unmet:  Not Applicable  Comments: Pt able to follow exercise prescription today without complaint.  Will continue to monitor for progression.    Dr. Emily Filbert is Medical Director for [place] and [name].

## 2020-06-26 ENCOUNTER — Encounter: Payer: Medicare PPO | Admitting: *Deleted

## 2020-06-26 ENCOUNTER — Other Ambulatory Visit: Payer: Self-pay

## 2020-06-26 DIAGNOSIS — Z1211 Encounter for screening for malignant neoplasm of colon: Secondary | ICD-10-CM | POA: Diagnosis not present

## 2020-06-26 DIAGNOSIS — I214 Non-ST elevation (NSTEMI) myocardial infarction: Secondary | ICD-10-CM | POA: Diagnosis not present

## 2020-06-26 NOTE — Progress Notes (Signed)
Daily Session Note  Patient Details  Name: Christian [name] MRN: [id number] Date of Birth: 1951-03-14 Referring Provider:     Cardiac Rehab from 04/15/2020 in [facility] Cardiac and Pulmonary Rehab  Referring Provider Neoma Laming  MD      Encounter Date: 06/26/2020  Check In:  Session Check In - 06/26/20 0938      Check-In   Supervising physician immediately available to respond to emergencies See telemetry face sheet for immediately available ER MD    Location [facility]-Cardiac & Pulmonary Rehab    Staff Present Justin Mend RCP,RRT,BSRT;Larrisha Babineau Sherryll Burger, RN Vickki Hearing, BA, ACSM CEP, Exercise Physiologist;Melissa Caiola RDN, LDN    Virtual Visit No    Medication changes reported     No    Fall or balance concerns reported    No    Warm-up and Cool-down Performed on first and last piece of equipment    Resistance Training Performed Yes    VAD Patient? No    PAD/SET Patient? No      Pain Assessment   Currently in Pain? No/denies              Social History   Tobacco Use  Smoking Status Never Smoker  Smokeless Tobacco Never Used    Goals Met:  Independence with exercise equipment Exercise tolerated well No report of cardiac concerns or symptoms Strength training completed today  Goals Unmet:  Not Applicable  Comments: Pt able to follow exercise prescription today without complaint.  Will continue to monitor for progression.    Dr. Emily Filbert is Medical Director for [place] and [name].

## 2020-06-27 ENCOUNTER — Ambulatory Visit: Payer: Medicare PPO | Admitting: Podiatry

## 2020-06-27 ENCOUNTER — Other Ambulatory Visit: Payer: Self-pay

## 2020-06-27 DIAGNOSIS — L97521 Non-pressure chronic ulcer of other part of left foot limited to breakdown of skin: Secondary | ICD-10-CM

## 2020-06-27 DIAGNOSIS — L97511 Non-pressure chronic ulcer of other part of right foot limited to breakdown of skin: Secondary | ICD-10-CM

## 2020-06-27 DIAGNOSIS — R0989 Other specified symptoms and signs involving the circulatory and respiratory systems: Secondary | ICD-10-CM | POA: Diagnosis not present

## 2020-06-27 DIAGNOSIS — E1149 Type 2 diabetes mellitus with other diabetic neurological complication: Secondary | ICD-10-CM

## 2020-07-01 ENCOUNTER — Encounter: Payer: Medicare PPO | Attending: Cardiovascular Disease | Admitting: *Deleted

## 2020-07-01 ENCOUNTER — Other Ambulatory Visit: Payer: Self-pay

## 2020-07-01 DIAGNOSIS — I214 Non-ST elevation (NSTEMI) myocardial infarction: Secondary | ICD-10-CM | POA: Insufficient documentation

## 2020-07-01 NOTE — Progress Notes (Signed)
Daily Session Note  Patient Details  Name: JONMARC BODKIN MRN: [id number] Date of Birth: 03-27-1951 Referring Provider:     Cardiac Rehab from 04/15/2020 in [facility] Cardiac and Pulmonary Rehab  Referring Provider Neoma Laming  MD      Encounter Date: 07/01/2020  Check In:  Session Check In - 07/01/20 1030      Check-In   Supervising physician immediately available to respond to emergencies See telemetry face sheet for immediately available ER MD    Location [facility]-Cardiac & Pulmonary Rehab    Staff Present Heath Lark, RN, BSN, CCRP;Melissa Mayfield RDN, Rowe Pavy, BA, ACSM CEP, Exercise Physiologist    Virtual Visit No    Medication changes reported     No    Fall or balance concerns reported    No    Warm-up and Cool-down Performed on first and last piece of equipment    Resistance Training Performed Yes    VAD Patient? No    PAD/SET Patient? No      Pain Assessment   Currently in Pain? No/denies              Social History   Tobacco Use  Smoking Status Never Smoker  Smokeless Tobacco Never Used    Goals Met:  Independence with exercise equipment Exercise tolerated well No report of cardiac concerns or symptoms  Goals Unmet:  Not Applicable  Comments: Pt able to follow exercise prescription today without complaint.  Will continue to monitor for progression.    Dr. Emily Filbert is Medical Director for [place] and [name].

## 2020-07-01 NOTE — Progress Notes (Signed)
Subjective: 69 year old male presents the office with his daughter for follow-up evaluation of wounds to both of his feet.  Been keeping Iodosorb on the wounds and the wounds are doing better.  Denies any drainage or pus or any swelling.  There is no new concerns today.  Scheduled for arterial studies. Denies any systemic complaints such as fevers, chills, nausea, vomiting. No acute changes since last appointment, and no other complaints at this time.   Objective: AAO x3, NAD DP/PT pulses decreased bilaterally, CRT less than 3 seconds Ulceration dorsal aspect of the left foot has improved and measures about 1.5 x 1.5 cm and superficial without any probing to bone or tunneling.  Also the wound on the dorsal to the second toe is doing much better and has a granular wound base.  There is no macerated tissue today.  There is no areas of fluctuation crepitation.  There is no malodor. No pain with calf compression, swelling, warmth, erythema  Assessment: 69 year old male with bilateral ulcerations, improving  Plan: -All treatment options discussed with the patient including all alternatives, risks, complications.  -While he repeated the points of any complications utilizing the 312 with scalpel.  Continue Iodosorb dressing changes daily.  He can wash the foot with soap and water.  Continue surgical shoe for offloading elevation. -Order arterial studies given decreased pulses -Monitor for any clinical signs or symptoms of infection and directed to call the office immediately should any occur or go to the ER. -Patient encouraged to call the office with any questions, concerns, change in symptoms.   Vivi Barrack DPM

## 2020-07-03 ENCOUNTER — Other Ambulatory Visit: Payer: Self-pay

## 2020-07-03 ENCOUNTER — Encounter: Payer: Medicare PPO | Admitting: *Deleted

## 2020-07-03 DIAGNOSIS — I214 Non-ST elevation (NSTEMI) myocardial infarction: Secondary | ICD-10-CM

## 2020-07-03 NOTE — Progress Notes (Signed)
Daily Session Note  Patient Details  Name: Christian [name] MRN: [id number] Date of Birth: 08/01/1951 Referring Provider:     Cardiac Rehab from 04/15/2020 in [facility] Cardiac and Pulmonary Rehab  Referring Provider Neoma Laming  MD      Encounter Date: 07/03/2020  Check In:  Session Check In - 07/03/20 1021      Check-In   Supervising physician immediately available to respond to emergencies See telemetry face sheet for immediately available ER MD    Location [facility]-Cardiac & Pulmonary Rehab    Staff Present Renita Papa, RN BSN;Susanne Bice, RN, BSN, CCRP;Melissa Caiola RDN, Rowe Pavy, BA, ACSM CEP, Exercise Physiologist    Virtual Visit No    Medication changes reported     No    Fall or balance concerns reported    No    Warm-up and Cool-down Performed on first and last piece of equipment    Resistance Training Performed Yes    VAD Patient? No    PAD/SET Patient? No      Pain Assessment   Currently in Pain? No/denies             Exercise Prescription Changes - 07/03/20 0900      Home Exercise Plan   Plans to continue exercise at Home (comment)   walk   Frequency Add 2 additional days to program exercise sessions.    Initial Home Exercises Provided 07/03/20           Social History   Tobacco Use  Smoking Status Never Smoker  Smokeless Tobacco Never Used    Goals Met:  Independence with exercise equipment Exercise tolerated well No report of cardiac concerns or symptoms Strength training completed today  Goals Unmet:  Not Applicable  Comments: Pt able to follow exercise prescription today without complaint.  Will continue to monitor for progression. Reviewed home exercise with pt today.  Pt plans to walk for exercise.  Reviewed THR, pulse, RPE, sign and symptoms, pulse oximetery and when to call 911 or MD.  Also discussed weather considerations and indoor options.  Pt voiced understanding.    Dr. Emily Filbert is Medical Director for [place] and [name].

## 2020-07-08 ENCOUNTER — Encounter: Payer: Medicare PPO | Admitting: *Deleted

## 2020-07-08 ENCOUNTER — Other Ambulatory Visit: Payer: Self-pay

## 2020-07-08 DIAGNOSIS — I214 Non-ST elevation (NSTEMI) myocardial infarction: Secondary | ICD-10-CM | POA: Diagnosis not present

## 2020-07-08 NOTE — Progress Notes (Signed)
Daily Session Note  Patient Details  Name: Christian [name] MRN: [id number] Date of Birth: 1950/12/27 Referring Provider:     Cardiac Rehab from 04/15/2020 in [facility] Cardiac and Pulmonary Rehab  Referring Provider Neoma Laming  MD      Encounter Date: 07/08/2020  Check In:  Session Check In - 07/08/20 1037      Check-In   Supervising physician immediately available to respond to emergencies See telemetry face sheet for immediately available ER MD    Location [facility]-Cardiac & Pulmonary Rehab    Staff Present Heath Lark, RN, BSN, Jacklynn Bue, MS Exercise Physiologist;Amanda Oletta Darter, [place], ACSM CEP, Exercise Physiologist    Virtual Visit No    Medication changes reported     No    Fall or balance concerns reported    No    Warm-up and Cool-down Performed on first and last piece of equipment    Resistance Training Performed Yes    VAD Patient? No    PAD/SET Patient? No      Pain Assessment   Currently in Pain? No/denies              Social History   Tobacco Use  Smoking Status Never Smoker  Smokeless Tobacco Never Used    Goals Met:  Independence with exercise equipment Exercise tolerated well No report of cardiac concerns or symptoms  Goals Unmet:  Not Applicable  Comments: Pt able to follow exercise prescription today without complaint.  Will continue to monitor for progression.    Dr. Emily Filbert is Medical Director for [place] and [name].

## 2020-07-09 ENCOUNTER — Encounter: Payer: Self-pay | Admitting: *Deleted

## 2020-07-09 DIAGNOSIS — I214 Non-ST elevation (NSTEMI) myocardial infarction: Secondary | ICD-10-CM

## 2020-07-09 NOTE — Progress Notes (Signed)
Cardiac Individual Treatment Plan  Patient Details  Name: Christian [name] MRN: [id number] Date of Birth: 09-30-1951 Referring Provider:     Cardiac Rehab from 04/15/2020 in [facility] Cardiac and Pulmonary Rehab  Referring Provider Neoma Laming  MD      Initial Encounter Date:    Cardiac Rehab from 04/15/2020 in [facility] Cardiac and Pulmonary Rehab  Date 04/15/20      Visit Diagnosis: NSTEMI (non-ST elevated myocardial infarction) [facility])  Patient's Home Medications on Admission:  Current Outpatient Medications:    ACETAMINOPHEN EXTRA STRENGTH 500 MG tablet, Take 1,000 mg by mouth 2 (two) times daily., Disp: , Rfl:    amLODipine (NORVASC) 10 MG tablet, Take 1 tablet (10 mg total) by mouth daily., Disp: 30 tablet, Rfl: 0   ARIPiprazole (ABILIFY) 20 MG tablet, Take 1 tablet (20 mg total) by mouth daily., Disp: 30 tablet, Rfl: 0   aspirin EC 81 MG EC tablet, Take 1 tablet (81 mg total) by mouth daily., Disp: 30 tablet, Rfl: 0   atorvastatin (LIPITOR) 10 MG tablet, Take 1 tablet (10 mg total) by mouth daily at 6 PM. (Patient taking differently: Take 80 mg by mouth daily at 6 PM. ), Disp: 30 tablet, Rfl: 0   buPROPion (WELLBUTRIN) 75 MG tablet, Take 1 tablet (75 mg total) by mouth daily., Disp: 30 tablet, Rfl: 0   divalproex (DEPAKOTE) 500 MG DR tablet, Take 1 tablet (500 mg total) by mouth every 12 (twelve) hours., Disp: 60 tablet, Rfl: 0   furosemide (LASIX) 20 MG tablet, Take 1 tablet (20 mg total) by mouth daily., Disp: 30 tablet, Rfl: 0   glimepiride (AMARYL) 2 MG tablet, Take 2 mg by mouth daily with breakfast., Disp: , Rfl:    insulin glargine (LANTUS SOLOSTAR) 100 UNIT/ML Solostar Pen, daily as needed., Disp: , Rfl:    liothyronine (CYTOMEL) 25 MCG tablet, Take 25 mcg by mouth daily before breakfast. , Disp: , Rfl:    lisinopril (ZESTRIL) 40 MG tablet, Take 1 tablet (40 mg total) by mouth daily., Disp: 30 tablet, Rfl: 0   metoprolol succinate (TOPROL-XL) 50 MG 24 hr tablet, Take  1 tablet (50 mg total) by mouth daily. Take with or immediately following a meal. (Patient taking differently: Take 25 mg by mouth daily. Take with or immediately following a meal.), Disp: 60 tablet, Rfl: 3   mirtazapine (REMERON SOL-TAB) 15 MG disintegrating tablet, Take 1 tablet (15 mg total) by mouth at bedtime., Disp: 30 tablet, Rfl: 0   QUEtiapine (SEROQUEL) 25 MG tablet, Take 25 mg by mouth 2 (two) times daily. , Disp: , Rfl:    QUEtiapine (SEROQUEL) 300 MG tablet, Take 300 mg by mouth at bedtime., Disp: , Rfl:    rivaroxaban (XARELTO) 20 MG TABS tablet, Take 1 tablet (20 mg total) by mouth daily with supper., Disp: 30 tablet, Rfl: 0   sitaGLIPtin (JANUVIA) 25 MG tablet, Take 25 mg by mouth daily., Disp: , Rfl:  No current facility-administered medications for this visit.  Facility-Administered Medications Ordered in Other Visits:    ondansetron (ZOFRAN) injection 4 mg, 4 mg, Intravenous, Once PRN, Gunnar Fusi, MD  Past Medical History: Past Medical History:  Diagnosis Date   Bilateral lower extremity edema: chronic with venous stasis changes 06/02/2014   Bipolar 1 disorder (HCC)    Bipolar disorder (HCC)    Chronic kidney disease    Gout    Gout    Hx of blood clots    Hypertension  Mood swings    OSA on CPAP 06/02/2014   Other and unspecified hyperlipidemia 06/02/2014   Type II or unspecified type diabetes mellitus with unspecified complication, uncontrolled 06/02/2014    Tobacco Use: Social History   Tobacco Use  Smoking Status Never Smoker  Smokeless Tobacco Never Used    Labs: Recent Review Flowsheet Data    Labs for ITP Cardiac and Pulmonary Rehab Latest Ref Rng & Units 09/01/2015 12/28/2015 01/30/2020 03/17/2020 03/31/2020   Cholestrol 0 - 200 mg/dL - 108 - - -   LDLCALC 0 - 99 mg/dL - 63 - - -   HDL >40 mg/dL - 33(L) - - -   Trlycerides <150 mg/dL - 62 - - -   Hemoglobin A1c 4.8 - 5.6 % 7.5(H) - 7.1(H) - 8.7(H)   PHART 7.35 - 7.45 7.51(H) - - 7.40  -   PCO2ART 32 - 48 mmHg 27(L) - - 41 -   HCO3 20.0 - 28.0 mmol/L 21.5 - - 25.4 -   TCO2 0 - 100 mmol/L - - - - -   ACIDBASEDEF 0.0 - 2.0 mmol/L 0.3 - - - -   O2SAT % 98.2 - - 90.6 -       Exercise Target Goals: Exercise Program Goal: Individual exercise prescription set using results from initial 6 min walk test and THRR while considering  patients activity barriers and safety.   Exercise Prescription Goal: Initial exercise prescription builds to 30-45 minutes a day of aerobic activity, 2-3 days per week.  Home exercise guidelines will be given to patient during program as part of exercise prescription that the participant will acknowledge.   Education: Aerobic Exercise & Resistance Training: - Gives group verbal and written instruction on the various components of exercise. Focuses on aerobic and resistive training programs and the benefits of this training and how to safely progress through these programs..   Education: Exercise & Equipment Safety: - Individual verbal instruction and demonstration of equipment use and safety with use of the equipment.   Cardiac Rehab from 07/03/2020 in [facility] Cardiac and Pulmonary Rehab  Date 04/15/20  Educator [facility]  Instruction Review Code 1- Verbalizes Understanding      Education: Exercise Physiology & General Exercise Guidelines: - Group verbal and written instruction with models to review the exercise physiology of the cardiovascular system and associated critical values. Provides general exercise guidelines with specific guidelines to those with heart or lung disease.    Cardiac Rehab from 07/03/2020 in [facility] Cardiac and Pulmonary Rehab  Date 06/12/20  Educator [facility]  Instruction Review Code 1- Verbalizes Understanding      Education: Flexibility, Balance, Mind/Body Relaxation: Provides group verbal/written instruction on the benefits of flexibility and balance training, including mind/body exercise modes such as yoga, pilates and tai chi.   Demonstration and skill practice provided.   Activity Barriers & Risk Stratification:  Activity Barriers & Cardiac Risk Stratification - 04/15/20 1536      Activity Barriers & Cardiac Risk Stratification   Activity Barriers Balance Concerns;Assistive Device;Decreased Ventricular Function;Deconditioning;Muscular Weakness;Back Problems;Other (comment);Joint Problems    Comments occasional back pain, torn rotator cuff (bilateral)    Cardiac Risk Stratification High           6 Minute Walk:  6 Minute Walk    Row Name 04/15/20 1535         6 Minute Walk   Distance 847 feet     Walk Time 6 minutes     # of Rest Breaks 0  MPH 1.6     METS 2.26     RPE 11     Perceived Dyspnea  3     VO2 Peak 7.89     Symptoms Yes (comment)     Comments SOB, hip pain 5/10     Resting HR 86 bpm     Resting BP 146/72     Resting Oxygen Saturation  97 %     Exercise Oxygen Saturation  during 6 min walk 96 %     Max Ex. HR 125 bpm     Max Ex. BP 176/88     2 Minute Post BP 142/78            Oxygen Initial Assessment:   Oxygen Re-Evaluation:   Oxygen Discharge (Final Oxygen Re-Evaluation):   Initial Exercise Prescription:  Initial Exercise Prescription - 04/15/20 1500      Date of Initial Exercise RX and Referring Provider   Date 04/15/20    Referring Provider Neoma Laming  MD      Treadmill   MPH 1.4    Grade 0.5    Minutes 15    METs 2.17      NuStep   Level 1    SPM 80    Minutes 15    METs 2      T5 Nustep   Level 1    SPM 80    Minutes 15    METs 2      Biostep-RELP   Level 1    SPM 50    Minutes 15    METs 2      Prescription Details   Frequency (times per week) 2    Duration Progress to 30 minutes of continuous aerobic without signs/symptoms of physical distress      Intensity   THRR 40-80% of Max Heartrate 113-140    Ratings of Perceived Exertion 11-13    Perceived Dyspnea 0-4      Progression   Progression Continue to progress workloads to  maintain intensity without signs/symptoms of physical distress.      Resistance Training   Training Prescription Yes    Weight 3 lb    Reps 10-15           Perform Capillary Blood Glucose checks as needed.  Exercise Prescription Changes:  Exercise Prescription Changes    Row Name 04/15/20 1500 04/29/20 1500 06/10/20 1200 06/24/20 1300 07/03/20 0900     Response to Exercise   Blood Pressure (Admit) 146/72 138/80 116/74 134/66 --   Blood Pressure (Exercise) 176/88 176/80 142/64 144/74 --   Blood Pressure (Exit) 142/78 128/60 142/74 108/66 --   Heart Rate (Admit) 86 bpm 108 bpm 105 bpm 99 bpm --   Heart Rate (Exercise) 125 bpm 136 bpm 110 bpm 104 bpm --   Heart Rate (Exit) 90 bpm 103 bpm 96 bpm 85 bpm --   Oxygen Saturation (Admit) 97 % -- -- -- --   Oxygen Saturation (Exercise) 96 % -- -- -- --   Rating of Perceived Exertion (Exercise) '11 15 13 11 ' --   Perceived Dyspnea (Exercise) 3 -- -- -- --   Symptoms SOB, hip pain 5/10, shuffling gait SOB -- none --   Comments walk test results first full day of exercise second day back -- --   Duration -- Progress to 30 minutes of  aerobic without signs/symptoms of physical distress Progress to 30 minutes of  aerobic without signs/symptoms of physical distress Continue with 30  min of aerobic exercise without signs/symptoms of physical distress. --   Intensity -- THRR unchanged THRR unchanged THRR unchanged --     Progression   Progression -- Continue to progress workloads to maintain intensity without signs/symptoms of physical distress. Continue to progress workloads to maintain intensity without signs/symptoms of physical distress. Continue to progress workloads to maintain intensity without signs/symptoms of physical distress. --   Average METs -- 1.98 2.4 1.96 --     Resistance Training   Training Prescription -- Yes Yes Yes --   Weight -- 3 lb 3 lb 3 lb --   Reps -- 10-15 10-15 10-15 --     Interval Training   Interval Training --  No No No --     Treadmill   MPH -- 1.4 -- 1.4 --   Grade -- 0.5 -- 0.5 --   Minutes -- 15 -- 15 --   METs -- 2.17 -- 2.17 --     NuStep   Level -- -- 1 3 --   SPM -- -- 80 -- --   Minutes -- -- 15 15 --   METs -- -- 2.4 1.9 --     T5 Nustep   Level -- 1 -- 2 --   Minutes -- 15 -- 15 --   METs -- 1.8 -- 1.8 --     Home Exercise Plan   Plans to continue exercise at -- -- -- -- Home (comment)  walk   Frequency -- -- -- -- Add 2 additional days to program exercise sessions.   Initial Home Exercises Provided -- -- -- -- 07/03/20   Row Name 07/08/20 1300             Response to Exercise   Blood Pressure (Admit) 124/76       Blood Pressure (Exercise) 152/80       Blood Pressure (Exit) 130/74       Heart Rate (Admit) 96 bpm       Heart Rate (Exercise) 106 bpm       Heart Rate (Exit) 92 bpm       Rating of Perceived Exertion (Exercise) 15       Symptoms none       Duration Continue with 30 min of aerobic exercise without signs/symptoms of physical distress.       Intensity THRR unchanged         Progression   Progression Continue to progress workloads to maintain intensity without signs/symptoms of physical distress.       Average METs 1.5         Resistance Training   Training Prescription Yes       Weight 3 lb       Reps 10-15         Interval Training   Interval Training No         NuStep   Level 3       SPM 50       Minutes 15       METs 2         Biostep-RELP   Level 1       Minutes 15       METs 1              Exercise Comments:  Exercise Comments    Row Name 04/22/20 1018 06/17/20 1122         Exercise Comments : First full day of exercise!  Patient was oriented to  gym and equipment including functions, settings, policies, and procedures.  Patient's individual exercise prescription and treatment plan were reviewed.  All starting workloads were established based on the results of the 6 minute walk test done at initial orientation visit.  The plan  for exercise progression was also introduced and progression will be customized based on patient's performance and goals. After 10 minutes on treadmill, pt experienced leg cramps "I haven't walked that much in a while". Exercise stopped, VSS. Pt sat down until resistance which he tolerated without difficulty.             Exercise Goals and Review:  Exercise Goals    Row Name 04/15/20 1538             Exercise Goals   Increase Physical Activity Yes       Intervention Provide advice, education, support and counseling about physical activity/exercise needs.;Develop an individualized exercise prescription for aerobic and resistive training based on initial evaluation findings, risk stratification, comorbidities and participant's personal goals.       Expected Outcomes Short Term: Attend rehab on a regular basis to increase amount of physical activity.;Long Term: Add in home exercise to make exercise part of routine and to increase amount of physical activity.;Long Term: Exercising regularly at least 3-5 days a week.       Increase Strength and Stamina Yes       Intervention Provide advice, education, support and counseling about physical activity/exercise needs.;Develop an individualized exercise prescription for aerobic and resistive training based on initial evaluation findings, risk stratification, comorbidities and participant's personal goals.       Expected Outcomes Short Term: Increase workloads from initial exercise prescription for resistance, speed, and METs.;Short Term: Perform resistance training exercises routinely during rehab and add in resistance training at home;Long Term: Improve cardiorespiratory fitness, muscular endurance and strength as measured by increased METs and functional capacity (6MWT)       Able to understand and use rate of perceived exertion (RPE) scale Yes       Intervention Provide education and explanation on how to use RPE scale       Expected Outcomes Short  Term: Able to use RPE daily in rehab to express subjective intensity level;Long Term:  Able to use RPE to guide intensity level when exercising independently       Able to understand and use Dyspnea scale Yes       Intervention Provide education and explanation on how to use Dyspnea scale       Expected Outcomes Short Term: Able to use Dyspnea scale daily in rehab to express subjective sense of shortness of breath during exertion;Long Term: Able to use Dyspnea scale to guide intensity level when exercising independently       Knowledge and understanding of Target Heart Rate Range (THRR) Yes       Intervention Provide education and explanation of THRR including how the numbers were predicted and where they are located for reference       Expected Outcomes Short Term: Able to state/look up THRR;Short Term: Able to use daily as guideline for intensity in rehab;Long Term: Able to use THRR to govern intensity when exercising independently       Able to check pulse independently Yes       Intervention Provide education and demonstration on how to check pulse in carotid and radial arteries.;Review the importance of being able to check your own pulse for safety during independent exercise  Expected Outcomes Short Term: Able to explain why pulse checking is important during independent exercise;Long Term: Able to check pulse independently and accurately       Understanding of Exercise Prescription Yes       Intervention Provide education, explanation, and written materials on patient's individual exercise prescription       Expected Outcomes Short Term: Able to explain program exercise prescription;Long Term: Able to explain home exercise prescription to exercise independently              Exercise Goals Re-Evaluation :  Exercise Goals Re-Evaluation    Row Name 04/22/20 1018 04/29/20 1520 05/26/20 1221 06/05/20 1007 06/10/20 1210     Exercise Goal Re-Evaluation   Exercise Goals Review Knowledge  and understanding of Target Heart Rate Range (THRR);Able to understand and use rate of perceived exertion (RPE) scale;Understanding of Exercise Prescription Increase Physical Activity;Increase Strength and Stamina;Understanding of Exercise Prescription -- Increase Physical Activity;Increase Strength and Stamina Increase Physical Activity;Increase Strength and Stamina;Able to understand and use rate of perceived exertion (RPE) scale   Comments Reviewed RPE and dyspnea scales, THR and program prescription with pt today.  Pt voiced understanding and was given a copy of goals to take home. Zev has only attended one session thus far and is having a cath/stent done today.  He will need clearance to return.  We will continue to monitor his progress. Out since last review with gout/blisters.  Waiting for clearance to return. Phoenyx has been doing a little walking and using his 2 pounds weights at home. He is starting back rehab since he had his stents put in two weeks ago. This is Romyn' first week back.  he has only done seated machines so far.  Staff will monitor progress.   Expected Outcomes Short: Use RPE daily to regulate intensity. Long: Follow program prescription in THR. Short: Clearance to return to rehab  Long: Continue to follow program prescription. -- Short: continue to attend [facility] regularly. Long: increase exercise loads and exerises at home. Short:  get back to regular attendance Long: increase stamina and MET level   Row Name 06/24/20 1313 07/03/20 1022 07/08/20 1328         Exercise Goal Re-Evaluation   Exercise Goals Review Increase Physical Activity;Increase Strength and Stamina;Able to understand and use rate of perceived exertion (RPE) scale Increase Physical Activity;Increase Strength and Stamina;Able to understand and use rate of perceived exertion (RPE) scale Increase Physical Activity;Increase Strength and Stamina;Able to understand and use rate of perceived exertion (RPE) scale      Comments Leshawn has been doing well in rehab.  He will usually stay on the same piece for the full time. However, he was able to walk on the treadmill on Tuesday of last week. We will continue to monitor his progress. Reviewed home exercise with pt today.  Pt plans to walk for exercise.  Reviewed THR, pulse, oximetry and when to call 911 or MD.  Also discussed weather considerations and indoor options.  Pt voiced understanding. Christorpher tolerates exercise well.  He has increased to 5 lb weights for strength training.     Expected Outcomes Short: Continue to attend regularly and try to keep walking on treadmill  Long: Continue to improve stamina and maintain spm. Short: add 2 days per week walking at home Long: maintain exercise independently Short:  continue to exercise consistently Long:  improve overall stamina            Discharge Exercise Prescription (Final Exercise  Prescription Changes):  Exercise Prescription Changes - 07/08/20 1300      Response to Exercise   Blood Pressure (Admit) 124/76    Blood Pressure (Exercise) 152/80    Blood Pressure (Exit) 130/74    Heart Rate (Admit) 96 bpm    Heart Rate (Exercise) 106 bpm    Heart Rate (Exit) 92 bpm    Rating of Perceived Exertion (Exercise) 15    Symptoms none    Duration Continue with 30 min of aerobic exercise without signs/symptoms of physical distress.    Intensity THRR unchanged      Progression   Progression Continue to progress workloads to maintain intensity without signs/symptoms of physical distress.    Average METs 1.5      Resistance Training   Training Prescription Yes    Weight 3 lb    Reps 10-15      Interval Training   Interval Training No      NuStep   Level 3    SPM 50    Minutes 15    METs 2      Biostep-RELP   Level 1    Minutes 15    METs 1           Nutrition:  Target Goals: Understanding of nutrition guidelines, daily intake of sodium <1588m, cholesterol <2085m calories 30% from fat  and 7% or less from saturated fats, daily to have 5 or more servings of fruits and vegetables.  Education: Controlling Sodium/Reading Food Labels -Group verbal and written material supporting the discussion of sodium use in heart healthy nutrition. Review and explanation with models, verbal and written materials for utilization of the food label.   Education: General Nutrition Guidelines/Fats and Fiber: -Group instruction provided by verbal, written material, models and posters to present the general guidelines for heart healthy nutrition. Gives an explanation and review of dietary fats and fiber.   Biometrics:  Pre Biometrics - 04/15/20 1538      Pre Biometrics   Height 6' 0.9" (1.852 m)    Weight 298 lb 8 oz (135.4 kg)    BMI (Calculated) 39.48    Single Leg Stand 0 seconds            Nutrition Therapy Plan and Nutrition Goals:  Nutrition Therapy & Goals - 06/20/20 0819      Nutrition Therapy   Diet Heart healthy, low sodium, T2DM    Drug/Food Interactions Purine/Gout    Protein (specify units) 105g    Fiber 30 grams    Whole Grain Foods 3 servings    Saturated Fats 12 max. grams    Fruits and Vegetables 5 servings/day    Sodium 1.5 grams      Personal Nutrition Goals   Nutrition Goal ST: review paperwork LT: Lower sodium, lower unhealthy fats, lower red meat intake    Comments Pt daughter on call with patient. T2DM: took A1c yesterday. Lantus insulin. B: 2 clementines, jimmy deam sausage x4, nutrilite ? or biscuit from [employer] or eggs, bacon and cheese (breakfast bowl) S: grapes or mixed nuts or yogurt (greek) or fruit bars L: 2-3 vegetables and a protein (ex given - chicken pot pie) a lot of baked chicken. Pt was told to limit water intake (still water retention in feet) D: vegetable plate or baked chicken with two vegetables at [employer]. Discussed MyPlate, diabetes MNT, gout MNT, low sodium and gave some suggestions. Will review paperwork with pt and give to  review with daughter. Next  consultation would like to explore why patient relies so heavily on convienience foods.      Intervention Plan   Intervention Nutrition handout(s) given to patient.;Prescribe, educate and counsel regarding individualized specific dietary modifications aiming towards targeted core components such as weight, hypertension, lipid management, diabetes, heart failure and other comorbidities.    Expected Outcomes Short Term Goal: A plan has been developed with personal nutrition goals set during dietitian appointment.;Long Term Goal: Adherence to prescribed nutrition plan.;Short Term Goal: Understand basic principles of dietary content, such as calories, fat, sodium, cholesterol and nutrients.           Nutrition Assessments:  Nutrition Assessments - 04/15/20 1539      MEDFICTS Scores   Pre Score 38           MEDIFICTS Score Key:          ?70 Need to make dietary changes          40-70 Heart Healthy Diet         ? 40 Therapeutic Level Cholesterol Diet  Nutrition Goals Re-Evaluation:   Nutrition Goals Discharge (Final Nutrition Goals Re-Evaluation):   Psychosocial: Target Goals: Acknowledge presence or absence of significant depression and/or stress, maximize coping skills, provide positive support system. Participant is able to verbalize types and ability to use techniques and skills needed for reducing stress and depression.   Education: Depression - Provides group verbal and written instruction on the correlation between heart/lung disease and depressed mood, treatment options, and the stigmas associated with seeking treatment.   Cardiac Rehab from 07/03/2020 in [facility] Cardiac and Pulmonary Rehab  Date 07/03/20  Educator SB  Instruction Review Code 1- [employer] Understanding      Education: Sleep Hygiene -Provides group verbal and written instruction about how sleep can affect your health.  Define sleep hygiene, discuss sleep cycles and impact of sleep  habits. Review good sleep hygiene tips.     Education: Stress and Anxiety: - Provides group verbal and written instruction about the health risks of elevated stress and causes of high stress.  Discuss the correlation between heart/lung disease and anxiety and treatment options. Review healthy ways to manage with stress and anxiety.   Cardiac Rehab from 07/03/2020 in [facility] Cardiac and Pulmonary Rehab  Date 07/03/20  Educator SB  Instruction Review Code 1- Verbalizes Understanding       Initial Review & Psychosocial Screening:  Initial Psych Review & Screening - 04/10/20 1446      Initial Review   Current issues with Current Anxiety/Panic;History of Depression;Current Psychotropic Meds;Current Depression;Current Stress Concerns    Source of Stress Concerns Chronic Illness;Retirement/disability    Comments On disability and retired, Would like to have more mobility, long history of bipolar disorder but feels well managed currently and denies current symptoms.      Family Dynamics   Good Support System? Yes   ex-wife, daughter, and sister (8 brothers and sister) all local     Barriers   Psychosocial barriers to participate in program The patient should benefit from training in stress management and relaxation.;Psychosocial barriers identified (see note)      Screening Interventions   Interventions Encouraged to exercise;To provide support and resources with identified psychosocial needs;Provide feedback about the scores to participant    Expected Outcomes Long Term Goal: Stressors or current issues are controlled or eliminated.;Short Term goal: Utilizing psychosocial counselor, staff and physician to assist with identification of specific Stressors or current issues interfering with healing process. Setting desired goal  for each stressor or current issue identified.;Short Term goal: Identification and review with participant of any Quality of Life or Depression concerns found by scoring the  questionnaire.;Long Term goal: The participant improves quality of Life and PHQ9 Scores as seen by post scores and/or verbalization of changes           Quality of Life Scores:   Quality of Life - 04/15/20 1539      Quality of Life   Select Quality of Life      Quality of Life Scores   Health/Function Pre 18 %    Socioeconomic Pre 19 %    Psych/Spiritual Pre 18.86 %    Family Pre 25.5 %    GLOBAL Pre 19.35 %          Scores of 19 and below usually indicate a poorer quality of life in these areas.  A difference of  2-3 points is a clinically meaningful difference.  A difference of 2-3 points in the total score of the Quality of Life Index has been associated with significant improvement in overall quality of life, self-image, physical symptoms, and general health in studies assessing change in quality of life.  PHQ-9: Recent Review Flowsheet Data    Depression screen [facility] 2/9 04/15/2020 10/09/2015   Decreased Interest 0 1   Down, Depressed, Hopeless 0 1   PHQ - 2 Score 0 2   Altered sleeping 0 1   Tired, decreased energy 1 1   Change in appetite 2 2   Feeling bad or failure about yourself  1 1   Trouble concentrating 0 0   Moving slowly or fidgety/restless 0 1   Suicidal thoughts 0 0   PHQ-9 Score 4 8   Difficult doing work/chores Not difficult at all Not difficult at all     Interpretation of Total Score  Total Score Depression Severity:  1-4 = Minimal depression, 5-9 = Mild depression, 10-14 = Moderate depression, 15-19 = Moderately severe depression, 20-27 = Severe depression   Psychosocial Evaluation and Intervention:  Psychosocial Evaluation - 04/10/20 1453      Psychosocial Evaluation & Interventions   Interventions Stress management education;Encouraged to exercise with the program and follow exercise prescription    Comments [place] is coming into cardiac rehab after a NSTEMI.  He has a long history of bipolar disorder but is medically managed and feels good  currently.  He is divorced but has a good relationship with his ex-wife. He also is close to his daughter. They two of them will be sharing the task of getting him to rehab each day.  He is also one of 9 brother and sisters all of which, minus 2, leave within the county.  He is hoping that rehab will supplement his PT/OT and help improve his mobility.  He is also looking foward to getting out to meet people again.   Exercise will help with mood and strengthen for him.    Expected Outcomes Short: Attend rehab regularly for mood boost and strength Long: Continued compliance on therapy and stable mood.    Continue Psychosocial Services  Follow up required by staff           Psychosocial Re-Evaluation:  Psychosocial Re-Evaluation    [place] Name 06/05/20 1012 07/03/20 1026           Psychosocial Re-Evaluation   Current issues with History of Depression;Current Depression;Current Psychotropic Meds;Current Stress Concerns --      Comments Caedon is feeling positive about  his health and looks to his daughter, ex wife and other family memebers for support. Jacey is feeling good.  He says he sleeps well -  especially with his CPAP.      Expected Outcomes Short: continue exercise to reduce stress. Long: maintain a positive outlook on his health independently. Short:  continue to exercise and practice good sleep habits Long: maintain positive outlook      Interventions Encouraged to attend Cardiac Rehabilitation for the exercise --      Continue Psychosocial Services  Follow up required by staff --             Psychosocial Discharge (Final Psychosocial Re-Evaluation):  Psychosocial Re-Evaluation - 07/03/20 1026      Psychosocial Re-Evaluation   Comments Freeland is feeling good.  He says he sleeps well -  especially with his CPAP.    Expected Outcomes Short:  continue to exercise and practice good sleep habits Long: maintain positive outlook           Vocational Rehabilitation: Provide  vocational rehab assistance to qualifying candidates.   Vocational Rehab Evaluation & Intervention:  Vocational Rehab - 04/10/20 1446      Initial Vocational Rehab Evaluation & Intervention   Assessment shows need for Vocational Rehabilitation No   disabled/retired          Education: Education Goals: Education classes will be provided on a variety of topics geared toward better understanding of heart health and risk factor modification. Participant will state understanding/return demonstration of topics presented as noted by education test scores.  Learning Barriers/Preferences:  Learning Barriers/Preferences - 04/10/20 1445      Learning Barriers/Preferences   Learning Barriers Sight   glasss   Learning Preferences None           General Cardiac Education Topics:  AED/CPR: - Group verbal and written instruction with the use of models to demonstrate the basic use of the AED with the basic ABC's of resuscitation.   Anatomy & Physiology of the Heart: - Group verbal and written instruction and models provide basic cardiac anatomy and physiology, with the coronary electrical and arterial systems. Review of Valvular disease and Heart Failure   Cardiac Procedures: - Group verbal and written instruction to review commonly prescribed medications for heart disease. Reviews the medication, class of the drug, and side effects. Includes the steps to properly store meds and maintain the prescription regimen. (beta blockers and nitrates)   Cardiac Medications I: - Group verbal and written instruction to review commonly prescribed medications for heart disease. Reviews the medication, class of the drug, and side effects. Includes the steps to properly store meds and maintain the prescription regimen.   Cardiac Rehab from 07/03/2020 in [facility] Cardiac and Pulmonary Rehab  Date 06/26/20  Educator SB  Instruction Review Code 1- Verbalizes Understanding      Cardiac Medications II: -Group  verbal and written instruction to review commonly prescribed medications for heart disease. Reviews the medication, class of the drug, and side effects. (all other drug classes)    Go Sex-Intimacy & Heart Disease, Get SMART - Goal Setting: - Group verbal and written instruction through game format to discuss heart disease and the return to sexual intimacy. Provides group verbal and written material to discuss and apply goal setting through the application of the S.M.A.R.T. Method.   Other Matters of the Heart: - Provides group verbal, written materials and models to describe Stable Angina and Peripheral Artery. Includes description of the disease process and  treatment options available to the cardiac patient.   Infection Prevention: - Provides verbal and written material to individual with discussion of infection control including proper hand washing and proper equipment cleaning during exercise session.   Cardiac Rehab from 07/03/2020 in [facility] Cardiac and Pulmonary Rehab  Date 04/15/20  Educator [facility]  Instruction Review Code 1- Verbalizes Understanding      Falls Prevention: - Provides verbal and written material to individual with discussion of falls prevention and safety.   Cardiac Rehab from 07/03/2020 in [facility] Cardiac and Pulmonary Rehab  Date 04/15/20  Educator [facility]  Instruction Review Code 1- Verbalizes Understanding      Other: -Provides group and verbal instruction on various topics (see comments)   Knowledge Questionnaire Score:  Knowledge Questionnaire Score - 04/15/20 1539      Knowledge Questionnaire Score   Pre Score 23/26 Education Focus: Angina, Nutrition, Exercise           Core Components/Risk Factors/Patient Goals at Admission:  Personal Goals and Risk Factors at Admission - 04/15/20 1540      Core Components/Risk Factors/Patient Goals on Admission    Weight Management Yes;Weight Loss;Obesity    Intervention Weight Management: Develop a combined nutrition and  exercise program designed to reach desired caloric intake, while maintaining appropriate intake of nutrient and fiber, sodium and fats, and appropriate energy expenditure required for the weight goal.;Weight Management: Provide education and appropriate resources to help participant work on and attain dietary goals.;Weight Management/Obesity: Establish reasonable short term and long term weight goals.;Obesity: Provide education and appropriate resources to help participant work on and attain dietary goals.    Admit Weight 298 lb 8 oz (135.4 kg)    Goal Weight: Short Term 293 lb (132.9 kg)    Goal Weight: Long Term 288 lb (130.6 kg)    Expected Outcomes Short Term: Continue to assess and modify interventions until short term weight is achieved;Long Term: Adherence to nutrition and physical activity/exercise program aimed toward attainment of established weight goal;Weight Loss: Understanding of general recommendations for a balanced deficit meal plan, which promotes 1-2 lb weight loss per week and includes a negative energy balance of [phone] kcal/d;Understanding recommendations for meals to include 15-35% energy as protein, 25-35% energy from fat, 35-60% energy from carbohydrates, less than 218m of dietary cholesterol, 20-35 gm of total fiber daily;Understanding of distribution of calorie intake throughout the day with the consumption of 4-5 meals/snacks    Diabetes Yes    Intervention Provide education about signs/symptoms and action to take for hypo/hyperglycemia.;Provide education about proper nutrition, including hydration, and aerobic/resistive exercise prescription along with prescribed medications to achieve blood glucose in normal ranges: Fasting glucose 65-99 mg/dL    Expected Outcomes Short Term: Participant verbalizes understanding of the signs/symptoms and immediate care of hyper/hypoglycemia, proper foot care and importance of medication, aerobic/resistive exercise and nutrition plan for blood  glucose control.;Long Term: Attainment of HbA1C < 7%.    Heart Failure Yes    Intervention Provide a combined exercise and nutrition program that is supplemented with education, support and counseling about heart failure. Directed toward relieving symptoms such as shortness of breath, decreased exercise tolerance, and extremity edema.    Expected Outcomes Improve functional capacity of life;Short term: Attendance in program 2-3 days a week with increased exercise capacity. Reported lower sodium intake. Reported increased fruit and vegetable intake. Reports medication compliance.;Short term: Daily weights obtained and reported for increase. Utilizing diuretic protocols set by physician.;Long term: Adoption of self-care skills and reduction of  barriers for early signs and symptoms recognition and intervention leading to self-care maintenance.    Hypertension Yes    Intervention Provide education on lifestyle modifcations including regular physical activity/exercise, weight management, moderate sodium restriction and increased consumption of fresh fruit, vegetables, and low fat dairy, alcohol moderation, and smoking cessation.;Monitor prescription use compliance.    Expected Outcomes Long Term: Maintenance of blood pressure at goal levels.;Short Term: Continued assessment and intervention until BP is < 140/44m HG in hypertensive participants. < 130/843mHG in hypertensive participants with diabetes, heart failure or chronic kidney disease.    Lipids Yes    Intervention Provide education and support for participant on nutrition & aerobic/resistive exercise along with prescribed medications to achieve LDL <7023mHDL >61m15m  Expected Outcomes Short Term: Participant states understanding of desired cholesterol values and is compliant with medications prescribed. Participant is following exercise prescription and nutrition guidelines.;Long Term: Cholesterol controlled with medications as prescribed, with  individualized exercise RX and with personalized nutrition plan. Value goals: LDL < 70mg26mL > 40 mg.           Education:Diabetes - Individual verbal and written instruction to review signs/symptoms of diabetes, desired ranges of glucose level fasting, after meals and with exercise. Acknowledge that pre and post exercise glucose checks will be done for 3 sessions at entry of program.   [facility]  Date 04/15/20  Educator [facility] Review Code 1- Verbalizes Understanding      Education: Know Your Numbers and Risk Factors: -Group verbal and written instruction about important numbers in your health.  Discussion of what are risk factors and how they play a role in the disease process.  Review of Cholesterol, Blood Pressure, Diabetes, and BMI and the role they play in your overall health.   Core Components/Risk Factors/Patient Goals Review:   Goals and Risk Factor Review    Row Name 06/05/20 1010 07/03/20 0956           Core Components/Risk Factors/Patient Goals Review   Personal Goals Review Weight Management/Obesity;Diabetes;Lipids;Hypertension;Heart Failure Weight Management/Obesity;Diabetes;Lipids;Hypertension;Heart Failure      Review CharlDawoodlost weight since the start of the program. He is working on losing more weight and controlling his sugar. His sugar was low a few times he was in rehab and has since been getting it more under control. He is checking his glucose at home twice a day. [place] to 276 lb from 301 lb.  He wants to start exercising at his apartment complex.  His foot is feeling better, but he is still seeing Dr about circulation in his legs.  His BG has been running a little high but CharlKoldenbeen off his diet.  BG normally runs about 125.      Expected Outcomes Short: lose weight and improve blood sugar levels. long: maintain weight loss and blood sugars independently Short:  continue to work  on controlling BG and weight loss Long: manage risk factors on his own             Core Components/Risk Factors/Patient Goals at Discharge (Final Review):   Goals and Risk Factor Review - 07/03/20 0956      Core Components/Risk Factors/Patient Goals Review   Personal Goals Review Weight Management/Obesity;Diabetes;Lipids;Hypertension;Heart Failure    Review [place] to 276 lb from 301 lb.  He wants to start exercising at his apartment complex.  His foot is feeling better, but he  is still seeing Dr about circulation in his legs.  His BG has been running a little high but [place] has been off his diet.  BG normally runs about 125.    Expected Outcomes Short:  continue to work on controlling BG and weight loss Long: manage risk factors on his own           ITP Comments:  ITP Comments    Row Name 04/10/20 1504 04/15/20 1535 04/16/20 0846 04/22/20 1018 04/29/20 1521   ITP Comments Completed virtual orientation today.  EP evaluation is scheduled for Tuesday 5/18 at 1pm.  Documentation for diagnosis can be found in [facility] encounter 03/31/20. Completed 6MWT and gym orientation.  Initial ITP created and sent for review to Dr. Emily Filbert, Medical Director. 30 Day review completed. Medical Director review done, changes made as directed,and approval shown by signature of Medical Director. : First full day of exercise!  Patient was oriented to gym and equipment including functions, settings, policies, and procedures.  Patient's individual exercise prescription and treatment plan were reviewed.  All starting workloads were established based on the results of the 6 minute walk test done at initial orientation visit.  The plan for exercise progression was also introduced and progression will be customized based on patient's performance and goals. Zade is having a cath/stent procedure done today and will be out the rest of the week.   [place] Name 05/13/20 1339 05/14/20 0615 05/26/20 1221 06/11/20 1037  07/09/20 0627   ITP Comments Jamesrobert has not attended since last review. 30 Day review completed. Medical Director ITP review done, changes made as directed, and signed approval by Medical Director. Called daughter, Sharl Ma,  to check up on pt.  He was out with gout and swelling in his foot.  The gout has caused blisters and more swelling.  They are supposed to see doctor today about his foot and possible wound care.  He was finally able to get a shoe on this weekend. Sharl Ma will let us know what the doctor says and his plans going forward.  Also, let them know about the schedule change coming up starting next week. 30 Day review completed. Medical Director ITP review done, changes made as directed, and signed approval by Medical Director. 30 Day review completed. Medical Director ITP review done, changes made as directed, and signed approval by Medical Director.          Comments:

## 2020-07-10 ENCOUNTER — Other Ambulatory Visit: Payer: Self-pay

## 2020-07-10 ENCOUNTER — Encounter: Payer: Medicare PPO | Admitting: *Deleted

## 2020-07-10 DIAGNOSIS — I214 Non-ST elevation (NSTEMI) myocardial infarction: Secondary | ICD-10-CM

## 2020-07-10 NOTE — Progress Notes (Signed)
Daily Session Note  Patient Details  Name: Christian [name] MRN: [id number] Date of Birth: 1951/06/25 Referring Provider:     Cardiac Rehab from 04/15/2020 in [facility] Cardiac and Pulmonary Rehab  Referring Provider Neoma Laming  MD      Encounter Date: 07/10/2020  Check In:  Session Check In - 07/10/20 1003      Check-In   Supervising physician immediately available to respond to emergencies See telemetry face sheet for immediately available ER MD    Location [facility]-Cardiac & Pulmonary Rehab    Staff Present Renita Papa, RN BSN;Jessica Luan Pulling, MA, RCEP, CCRP, CCET;Melissa [place]endale RDN, LDN;Susanne Bice, RN, BSN, [employer], BA, ACSM CEP, Exercise Physiologist    Virtual Visit No    Medication changes reported     No    Fall or balance concerns reported    No    Warm-up and Cool-down Performed on first and last piece of equipment    Resistance Training Performed Yes    VAD Patient? No    PAD/SET Patient? No      Pain Assessment   Currently in Pain? No/denies              Social History   Tobacco Use  Smoking Status Never Smoker  Smokeless Tobacco Never Used    Goals Met:  Independence with exercise equipment Exercise tolerated well No report of cardiac concerns or symptoms Strength training completed today  Goals Unmet:  Not Applicable  Comments: Pt able to follow exercise prescription today without complaint.  Will continue to monitor for progression.    Dr. Emily Filbert is Medical Director for [place] and [name].

## 2020-07-15 ENCOUNTER — Other Ambulatory Visit: Payer: Self-pay

## 2020-07-15 ENCOUNTER — Encounter: Payer: Medicare PPO | Admitting: *Deleted

## 2020-07-15 DIAGNOSIS — I214 Non-ST elevation (NSTEMI) myocardial infarction: Secondary | ICD-10-CM | POA: Diagnosis not present

## 2020-07-15 NOTE — Progress Notes (Signed)
Daily Session Note  Patient Details  Name: Christian [name] MRN: [id number] Date of Birth: [birth year] Referring Provider:     Cardiac Rehab from 04/15/2020 in [facility] Cardiac and Pulmonary Rehab  Referring Provider Neoma Laming  MD      Encounter Date: 07/15/2020  Check In:  Session Check In - 07/15/20 1000      Check-In   Supervising physician immediately available to respond to emergencies See telemetry face sheet for immediately available ER MD    Location [facility]-Cardiac & Pulmonary Rehab    Staff Present Heath Lark, RN, BSN, Jacklynn Bue, MS Exercise Physiologist;Amanda Oletta Darter, [place], ACSM CEP, Exercise Physiologist    Virtual Visit No    Medication changes reported     No    Fall or balance concerns reported    No    Warm-up and Cool-down Performed on first and last piece of equipment    Resistance Training Performed Yes    VAD Patient? No    PAD/SET Patient? No      Pain Assessment   Currently in Pain? No/denies              Social History   Tobacco Use  Smoking Status Never Smoker  Smokeless Tobacco Never Used    Goals Met:  Independence with exercise equipment Exercise tolerated well No report of cardiac concerns or symptoms  Goals Unmet:  Not Applicable  Comments: Pt able to follow exercise prescription today without complaint.  Will continue to monitor for progression.    Dr. Emily Filbert is Medical Director for [place] and [name].

## 2020-07-17 ENCOUNTER — Other Ambulatory Visit: Payer: Self-pay

## 2020-07-17 ENCOUNTER — Encounter: Payer: Medicare PPO | Admitting: *Deleted

## 2020-07-17 DIAGNOSIS — I214 Non-ST elevation (NSTEMI) myocardial infarction: Secondary | ICD-10-CM | POA: Diagnosis not present

## 2020-07-17 NOTE — Progress Notes (Signed)
Daily Session Note  Patient Details  Name: Christian [name] MRN: [id number] Date of Birth: [birth year] Referring Provider:     Cardiac Rehab from 04/15/2020 in [facility] Cardiac and Pulmonary Rehab  Referring Provider Neoma Laming  MD      Encounter Date: 07/17/2020  Check In:  Session Check In - 07/17/20 1012      Check-In   Supervising physician immediately available to respond to emergencies See telemetry face sheet for immediately available ER MD    Location [facility]-Cardiac & Pulmonary Rehab    Staff Present Heath Lark, RN, BSN, CCRP;Melissa Dixon RDN, Rowe Pavy, BA, ACSM CEP, Exercise Physiologist    Virtual Visit No    Medication changes reported     No    Fall or balance concerns reported    No    Warm-up and Cool-down Performed on first and last piece of equipment    Resistance Training Performed Yes    VAD Patient? No    PAD/SET Patient? No      Pain Assessment   Currently in Pain? No/denies              Social History   Tobacco Use  Smoking Status Never Smoker  Smokeless Tobacco Never Used    Goals Met:  Independence with exercise equipment Exercise tolerated well No report of cardiac concerns or symptoms  Goals Unmet:  Not Applicable  Comments: Pt able to follow exercise prescription today without complaint.  Will continue to monitor for progression.    Dr. Emily Filbert is Medical Director for [place] and [name].

## 2020-07-21 ENCOUNTER — Ambulatory Visit: Payer: Medicare PPO | Admitting: Podiatry

## 2020-07-22 ENCOUNTER — Other Ambulatory Visit: Payer: Self-pay

## 2020-07-22 ENCOUNTER — Encounter: Payer: Medicare PPO | Admitting: *Deleted

## 2020-07-22 DIAGNOSIS — I214 Non-ST elevation (NSTEMI) myocardial infarction: Secondary | ICD-10-CM | POA: Diagnosis not present

## 2020-07-22 NOTE — Progress Notes (Signed)
Daily Session Note  Patient Details  Name: REISE GLADNEY MRN: [id number] Date of Birth: 1950/12/01 Referring Provider:     Cardiac Rehab from 04/15/2020 in [facility] Cardiac and Pulmonary Rehab  Referring Provider Neoma Laming  MD      Encounter Date: 07/22/2020  Check In:  Session Check In - 07/22/20 1017      Check-In   Supervising physician immediately available to respond to emergencies See telemetry face sheet for immediately available ER MD    Location [facility]-Cardiac & Pulmonary Rehab    Staff Present Heath Lark, RN, BSN, Jacklynn Bue, MS Exercise Physiologist;Amanda Oletta Darter, [place], ACSM CEP, Exercise Physiologist    Virtual Visit No    Medication changes reported     No    Fall or balance concerns reported    No    Warm-up and Cool-down Performed on first and last piece of equipment    Resistance Training Performed Yes    VAD Patient? No    PAD/SET Patient? No      Pain Assessment   Currently in Pain? No/denies              Social History   Tobacco Use  Smoking Status Never Smoker  Smokeless Tobacco Never Used    Goals Met:  Independence with exercise equipment Exercise tolerated well No report of cardiac concerns or symptoms  Goals Unmet:  Not Applicable  Comments: Pt able to follow exercise prescription today without complaint.  Will continue to monitor for progression.    Dr. Emily Filbert is Medical Director for [place] and [name].

## 2020-07-23 ENCOUNTER — Other Ambulatory Visit: Payer: Self-pay

## 2020-07-23 ENCOUNTER — Ambulatory Visit (HOSPITAL_COMMUNITY)
Admission: RE | Admit: 2020-07-23 | Discharge: 2020-07-23 | Disposition: A | Discharge status: Home / Self Care | Payer: Medicare PPO | Source: Ambulatory Visit | Attending: Internal Medicine | Admitting: Internal Medicine

## 2020-07-23 DIAGNOSIS — L97511 Non-pressure chronic ulcer of other part of right foot limited to breakdown of skin: Secondary | ICD-10-CM | POA: Insufficient documentation

## 2020-07-23 DIAGNOSIS — L97521 Non-pressure chronic ulcer of other part of left foot limited to breakdown of skin: Secondary | ICD-10-CM | POA: Insufficient documentation

## 2020-07-23 DIAGNOSIS — R0989 Other specified symptoms and signs involving the circulatory and respiratory systems: Secondary | ICD-10-CM | POA: Insufficient documentation

## 2020-07-24 ENCOUNTER — Encounter: Payer: Medicare PPO | Admitting: *Deleted

## 2020-07-24 DIAGNOSIS — I214 Non-ST elevation (NSTEMI) myocardial infarction: Secondary | ICD-10-CM

## 2020-07-24 NOTE — Progress Notes (Signed)
Daily Session Note  Patient Details  Name: Christian [name] MRN: [id number] Date of Birth: 1950/12/27 Referring Provider:     Cardiac Rehab from 04/15/2020 in [facility] Cardiac and Pulmonary Rehab  Referring Provider Neoma Laming  MD      Encounter Date: 07/24/2020  Check In:  Session Check In - 07/24/20 1013      Check-In   Supervising physician immediately available to respond to emergencies See telemetry face sheet for immediately available ER MD    Location [facility]-Cardiac & Pulmonary Rehab    Staff Present Renita Papa, RN Margurite Auerbach, MS Exercise Physiologist;Jessica Wheatland, MA, RCEP, CCRP, CCET;Amanda Sommer, BA, ACSM CEP, Exercise Physiologist;Susanne Bice, RN, BSN, CCRP;Melissa Caiola RDN, LDN    Virtual Visit No    Medication changes reported     No    Fall or balance concerns reported    No    Warm-up and Cool-down Performed on first and last piece of equipment    Resistance Training Performed Yes    VAD Patient? No    PAD/SET Patient? No      Pain Assessment   Currently in Pain? No/denies              Social History   Tobacco Use  Smoking Status Never Smoker  Smokeless Tobacco Never Used    Goals Met:  Independence with exercise equipment Exercise tolerated well No report of cardiac concerns or symptoms Strength training completed today  Goals Unmet:  Not Applicable  Comments: Pt able to follow exercise prescription today without complaint.  Will continue to monitor for progression.   Dr. Emily Filbert is Medical Director for [place] and [name].

## 2020-07-29 ENCOUNTER — Other Ambulatory Visit: Payer: Self-pay

## 2020-07-29 ENCOUNTER — Encounter: Payer: Medicare PPO | Admitting: *Deleted

## 2020-07-29 DIAGNOSIS — I214 Non-ST elevation (NSTEMI) myocardial infarction: Secondary | ICD-10-CM

## 2020-07-29 NOTE — Progress Notes (Signed)
Daily Session Note  Patient Details  Name: AZAREL BANNER MRN: [id number] Date of Birth: [birth year] Referring Provider:     Cardiac Rehab from 04/15/2020 in [facility] Cardiac and Pulmonary Rehab  Referring Provider Neoma Laming  MD      Encounter Date: 07/29/2020  Check In:  Session Check In - 07/29/20 1041      Check-In   Supervising physician immediately available to respond to emergencies See telemetry face sheet for immediately available ER MD    Location [facility]-Cardiac & Pulmonary Rehab    Staff Present Heath Lark, RN, BSN, Jacklynn Bue, MS Exercise Physiologist;Amanda Oletta Darter, [place], ACSM CEP, Exercise Physiologist    Virtual Visit No    Medication changes reported     No    Fall or balance concerns reported    No    Warm-up and Cool-down Performed on first and last piece of equipment    Resistance Training Performed Yes    VAD Patient? No    PAD/SET Patient? No      Pain Assessment   Currently in Pain? No/denies              Social History   Tobacco Use  Smoking Status Never Smoker  Smokeless Tobacco Never Used    Goals Met:  Independence with exercise equipment Exercise tolerated well No report of cardiac concerns or symptoms  Goals Unmet:  Not Applicable  Comments: Pt able to follow exercise prescription today without complaint.  Will continue to monitor for progression.    Dr. Emily Filbert is Medical Director for [place] and [name].

## 2020-07-31 ENCOUNTER — Other Ambulatory Visit: Payer: Self-pay

## 2020-07-31 ENCOUNTER — Encounter: Payer: Medicare PPO | Attending: Cardiovascular Disease | Admitting: *Deleted

## 2020-07-31 DIAGNOSIS — I509 Heart failure, unspecified: Secondary | ICD-10-CM | POA: Diagnosis not present

## 2020-07-31 DIAGNOSIS — Z9861 Coronary angioplasty status: Secondary | ICD-10-CM | POA: Diagnosis not present

## 2020-07-31 DIAGNOSIS — I2510 Atherosclerotic heart disease of native coronary artery without angina pectoris: Secondary | ICD-10-CM | POA: Diagnosis not present

## 2020-07-31 DIAGNOSIS — I214 Non-ST elevation (NSTEMI) myocardial infarction: Secondary | ICD-10-CM | POA: Diagnosis not present

## 2020-07-31 DIAGNOSIS — I10 Essential (primary) hypertension: Secondary | ICD-10-CM | POA: Diagnosis not present

## 2020-07-31 DIAGNOSIS — G4739 Other sleep apnea: Secondary | ICD-10-CM | POA: Diagnosis not present

## 2020-07-31 DIAGNOSIS — E785 Hyperlipidemia, unspecified: Secondary | ICD-10-CM | POA: Diagnosis not present

## 2020-07-31 NOTE — Progress Notes (Signed)
Daily Session Note  Patient Details  Name: Christian [name] MRN: [id number] Date of Birth: 26-Jul-1951 Referring Provider:     Cardiac Rehab from 04/15/2020 in [facility] Cardiac and Pulmonary Rehab  Referring Provider Neoma Laming  MD      Encounter Date: 07/31/2020  Check In:  Session Check In - 07/31/20 0951      Check-In   Supervising physician immediately available to respond to emergencies See telemetry face sheet for immediately available ER MD    Location [facility]-Cardiac & Pulmonary Rehab    Staff Present Renita Papa, RN Margurite Auerbach, MS Exercise Physiologist;Jessica Luan Pulling, MA, RCEP, CCRP, CCET;Melissa [place] RDN, Rowe Pavy, [place], ACSM CEP, Exercise Physiologist    Virtual Visit No    Medication changes reported     No    Fall or balance concerns reported    No    Warm-up and Cool-down Performed on first and last piece of equipment    Resistance Training Performed Yes    VAD Patient? No    PAD/SET Patient? No      Pain Assessment   Currently in Pain? No/denies              Social History   Tobacco Use  Smoking Status Never Smoker  Smokeless Tobacco Never Used    Goals Met:  Independence with exercise equipment Exercise tolerated well No report of cardiac concerns or symptoms Strength training completed today  Goals Unmet:  Not Applicable  Comments: Pt able to follow exercise prescription today without complaint.  Will continue to monitor for progression.    Dr. Emily Filbert is Medical Director for [place] and [name].

## 2020-08-05 ENCOUNTER — Other Ambulatory Visit: Payer: Self-pay

## 2020-08-05 ENCOUNTER — Encounter: Payer: Medicare PPO | Admitting: *Deleted

## 2020-08-05 DIAGNOSIS — I214 Non-ST elevation (NSTEMI) myocardial infarction: Secondary | ICD-10-CM | POA: Diagnosis not present

## 2020-08-05 NOTE — Progress Notes (Signed)
Daily Session Note  Patient Details  Name: Christian [name] MRN: [id number] Date of Birth: [birth year] Referring Provider:     Cardiac Rehab from 04/15/2020 in [facility] Cardiac and Pulmonary Rehab  Referring Provider Neoma Laming  MD      Encounter Date: 08/05/2020  Check In:  Session Check In - 08/05/20 1123      Check-In   Supervising physician immediately available to respond to emergencies See telemetry face sheet for immediately available ER MD    Location [facility]-Cardiac & Pulmonary Rehab    Staff Present Renita Papa, RN Geralyn Corwin, RN Margurite Auerbach, MS Exercise Physiologist;Amanda Oletta Darter, [place], ACSM CEP, Exercise Physiologist    Virtual Visit No    Medication changes reported     No    Fall or balance concerns reported    No    Warm-up and Cool-down Performed on first and last piece of equipment    Resistance Training Performed Yes    VAD Patient? No    PAD/SET Patient? No      Pain Assessment   Currently in Pain? No/denies              Social History   Tobacco Use  Smoking Status Never Smoker  Smokeless Tobacco Never Used    Goals Met:  Independence with exercise equipment Exercise tolerated well No report of cardiac concerns or symptoms Strength training completed today  Goals Unmet:  Not Applicable  Comments: Pt able to follow exercise prescription today without complaint.  Will continue to monitor for progression.    Dr. Emily Filbert is Medical Director for [place] and [name].

## 2020-08-06 ENCOUNTER — Encounter: Payer: Self-pay | Admitting: *Deleted

## 2020-08-06 DIAGNOSIS — I214 Non-ST elevation (NSTEMI) myocardial infarction: Secondary | ICD-10-CM

## 2020-08-06 NOTE — Progress Notes (Signed)
Cardiac Individual Treatment Plan  Patient Details  Name: Christian [name] MRN: [id number] Date of Birth: September 21, 1951 Referring Provider:     Cardiac Rehab from 04/15/2020 in [facility] Cardiac and Pulmonary Rehab  Referring Provider Neoma Laming  MD      Initial Encounter Date:    Cardiac Rehab from 04/15/2020 in [facility] Cardiac and Pulmonary Rehab  Date 04/15/20      Visit Diagnosis: NSTEMI (non-ST elevated myocardial infarction) [facility])  Patient's Home Medications on Admission:  Current Outpatient Medications:  .  ACETAMINOPHEN EXTRA STRENGTH 500 MG tablet, Take 1,000 mg by mouth 2 (two) times daily., Disp: , Rfl:  .  amLODipine (NORVASC) 10 MG tablet, Take 1 tablet (10 mg total) by mouth daily., Disp: 30 tablet, Rfl: 0 .  ARIPiprazole (ABILIFY) 20 MG tablet, Take 1 tablet (20 mg total) by mouth daily., Disp: 30 tablet, Rfl: 0 .  aspirin EC 81 MG EC tablet, Take 1 tablet (81 mg total) by mouth daily., Disp: 30 tablet, Rfl: 0 .  atorvastatin (LIPITOR) 10 MG tablet, Take 1 tablet (10 mg total) by mouth daily at 6 PM. (Patient taking differently: Take 80 mg by mouth daily at 6 PM. ), Disp: 30 tablet, Rfl: 0 .  buPROPion (WELLBUTRIN) 75 MG tablet, Take 1 tablet (75 mg total) by mouth daily., Disp: 30 tablet, Rfl: 0 .  divalproex (DEPAKOTE) 500 MG DR tablet, Take 1 tablet (500 mg total) by mouth every 12 (twelve) hours., Disp: 60 tablet, Rfl: 0 .  furosemide (LASIX) 20 MG tablet, Take 1 tablet (20 mg total) by mouth daily., Disp: 30 tablet, Rfl: 0 .  glimepiride (AMARYL) 2 MG tablet, Take 2 mg by mouth daily with breakfast., Disp: , Rfl:  .  insulin glargine (LANTUS SOLOSTAR) 100 UNIT/ML Solostar Pen, daily as needed., Disp: , Rfl:  .  liothyronine (CYTOMEL) 25 MCG tablet, Take 25 mcg by mouth daily before breakfast. , Disp: , Rfl:  .  lisinopril (ZESTRIL) 40 MG tablet, Take 1 tablet (40 mg total) by mouth daily., Disp: 30 tablet, Rfl: 0 .  metoprolol succinate (TOPROL-XL) 50 MG 24 hr tablet, Take  1 tablet (50 mg total) by mouth daily. Take with or immediately following a meal. (Patient taking differently: Take 25 mg by mouth daily. Take with or immediately following a meal.), Disp: 60 tablet, Rfl: 3 .  mirtazapine (REMERON SOL-TAB) 15 MG disintegrating tablet, Take 1 tablet (15 mg total) by mouth at bedtime., Disp: 30 tablet, Rfl: 0 .  QUEtiapine (SEROQUEL) 25 MG tablet, Take 25 mg by mouth 2 (two) times daily. , Disp: , Rfl:  .  QUEtiapine (SEROQUEL) 300 MG tablet, Take 300 mg by mouth at bedtime., Disp: , Rfl:  .  rivaroxaban (XARELTO) 20 MG TABS tablet, Take 1 tablet (20 mg total) by mouth daily with supper., Disp: 30 tablet, Rfl: 0 .  sitaGLIPtin (JANUVIA) 25 MG tablet, Take 25 mg by mouth daily., Disp: , Rfl:  No current facility-administered medications for this visit.  Facility-Administered Medications Ordered in Other Visits:  .  ondansetron (ZOFRAN) injection 4 mg, 4 mg, Intravenous, Once PRN, Ronelle Nigh Jamse Mead, MD  Past Medical History: Past Medical History:  Diagnosis Date  . Bilateral lower extremity edema: chronic with venous stasis changes 06/02/2014  . Bipolar 1 disorder ([place])   . Bipolar disorder ([place])   . Chronic kidney disease   . Gout   . Gout   . Hx of blood clots   . Hypertension   .  Mood swings   . OSA on CPAP 06/02/2014  . Other and unspecified hyperlipidemia 06/02/2014  . Type II or unspecified type diabetes mellitus with unspecified complication, uncontrolled 06/02/2014    Tobacco Use: Social History   Tobacco Use  Smoking Status Never Smoker  Smokeless Tobacco Never Used    Labs: Recent Review Flowsheet Data    Labs for ITP Cardiac and Pulmonary Rehab Latest Ref Rng & Units 09/01/2015 12/28/2015 01/30/2020 03/17/2020 03/31/2020   Cholestrol 0 - 200 mg/dL - 108 - - -   LDLCALC 0 - 99 mg/dL - 63 - - -   HDL >40 mg/dL - 33(L) - - -   Trlycerides <150 mg/dL - 62 - - -   Hemoglobin A1c 4.8 - 5.6 % 7.5(H) - 7.1(H) - 8.7(H)   PHART 7.35 - 7.45 7.51(H) - - 7.40  -   PCO2ART 32 - 48 mmHg 27(L) - - 41 -   HCO3 20.0 - 28.0 mmol/L 21.5 - - 25.4 -   TCO2 0 - 100 mmol/L - - - - -   ACIDBASEDEF 0.0 - 2.0 mmol/L 0.3 - - - -   O2SAT % 98.2 - - 90.6 -       Exercise Target Goals: Exercise Program Goal: Individual exercise prescription set using results from initial 6 min walk test and THRR while considering  patient's activity barriers and safety.   Exercise Prescription Goal: Initial exercise prescription builds to 30-45 minutes a day of aerobic activity, 2-3 days per week.  Home exercise guidelines will be given to patient during program as part of exercise prescription that the participant will acknowledge.   Education: Aerobic Exercise & Resistance Training: - Gives group verbal and written instruction on the various components of exercise. Focuses on aerobic and resistive training programs and the benefits of this training and how to safely progress through these programs..   Cardiac Rehab from 07/24/2020 in [facility] Cardiac and Pulmonary Rehab  Date 07/24/20  Educator [facility]  [resistance]  Instruction Review Code 1- Verbalizes Understanding      Education: Exercise & Equipment Safety: - Individual verbal instruction and demonstration of equipment use and safety with use of the equipment.   Cardiac Rehab from 07/24/2020 in [facility] Cardiac and Pulmonary Rehab  Date 04/15/20  Educator [facility]  Instruction Review Code 1- Verbalizes Understanding      Education: Exercise Physiology & General Exercise Guidelines: - Group verbal and written instruction with models to review the exercise physiology of the cardiovascular system and associated critical values. Provides general exercise guidelines with specific guidelines to those with heart or lung disease.    Cardiac Rehab from 07/24/2020 in [facility] Cardiac and Pulmonary Rehab  Date 06/12/20  Educator [facility]  Instruction Review Code 1- Verbalizes Understanding      Education: Flexibility, Balance, Mind/Body  Relaxation: Provides group verbal/written instruction on the benefits of flexibility and balance training, including mind/body exercise modes such as yoga, pilates and tai chi.  Demonstration and skill practice provided.   Activity Barriers & Risk Stratification:  Activity Barriers & Cardiac Risk Stratification - 04/15/20 1536      Activity Barriers & Cardiac Risk Stratification   Activity Barriers Balance Concerns;Assistive Device;Decreased Ventricular Function;Deconditioning;Muscular Weakness;Back Problems;Other (comment);Joint Problems    Comments occasional back pain, torn rotator cuff (bilateral)    Cardiac Risk Stratification High           6 Minute Walk:  6 Minute Walk    Row Name 04/15/20 1535  6 Minute Walk   Distance 847 feet     Walk Time 6 minutes     # of Rest Breaks 0     MPH 1.6     METS 2.26     RPE 11     Perceived Dyspnea  3     VO2 Peak 7.89     Symptoms Yes (comment)     Comments SOB, hip pain 5/10     Resting HR 86 bpm     Resting BP 146/72     Resting Oxygen Saturation  97 %     Exercise Oxygen Saturation  during 6 min walk 96 %     Max Ex. HR 125 bpm     Max Ex. BP 176/88     2 Minute Post BP 142/78            Oxygen Initial Assessment:   Oxygen Re-Evaluation:   Oxygen Discharge (Final Oxygen Re-Evaluation):   Initial Exercise Prescription:  Initial Exercise Prescription - 04/15/20 1500      Date of Initial Exercise RX and Referring Provider   Date 04/15/20    Referring Provider Neoma Laming  MD      Treadmill   MPH 1.4    Grade 0.5    Minutes 15    METs 2.17      NuStep   Level 1    SPM 80    Minutes 15    METs 2      T5 Nustep   Level 1    SPM 80    Minutes 15    METs 2      Biostep-RELP   Level 1    SPM 50    Minutes 15    METs 2      Prescription Details   Frequency (times per week) 2    Duration Progress to 30 minutes of continuous aerobic without signs/symptoms of physical distress       Intensity   THRR 40-80% of Max Heartrate 113-140    Ratings of Perceived Exertion 11-13    Perceived Dyspnea 0-4      Progression   Progression Continue to progress workloads to maintain intensity without signs/symptoms of physical distress.      Resistance Training   Training Prescription Yes    Weight 3 lb    Reps 10-15           Perform Capillary Blood Glucose checks as needed.  Exercise Prescription Changes:  Exercise Prescription Changes    Row Name 04/15/20 1500 04/29/20 1500 06/10/20 1200 06/24/20 1300 07/03/20 0900     Response to Exercise   Blood Pressure (Admit) 146/72 138/80 116/74 134/66 --   Blood Pressure (Exercise) 176/88 176/80 142/64 144/74 --   Blood Pressure (Exit) 142/78 128/60 142/74 108/66 --   Heart Rate (Admit) 86 bpm 108 bpm 105 bpm 99 bpm --   Heart Rate (Exercise) 125 bpm 136 bpm 110 bpm 104 bpm --   Heart Rate (Exit) 90 bpm 103 bpm 96 bpm 85 bpm --   Oxygen Saturation (Admit) 97 % -- -- -- --   Oxygen Saturation (Exercise) 96 % -- -- -- --   Rating of Perceived Exertion (Exercise) 11 15 13 11  --   Perceived Dyspnea (Exercise) 3 -- -- -- --   Symptoms SOB, hip pain 5/10, shuffling gait SOB -- none --   Comments walk test results first full day of exercise second day back -- --  Duration -- Progress to 30 minutes of  aerobic without signs/symptoms of physical distress Progress to 30 minutes of  aerobic without signs/symptoms of physical distress Continue with 30 min of aerobic exercise without signs/symptoms of physical distress. --   Intensity -- THRR unchanged THRR unchanged THRR unchanged --     Progression   Progression -- Continue to progress workloads to maintain intensity without signs/symptoms of physical distress. Continue to progress workloads to maintain intensity without signs/symptoms of physical distress. Continue to progress workloads to maintain intensity without signs/symptoms of physical distress. --   Average METs -- 1.98 2.4 1.96  --     Resistance Training   Training Prescription -- Yes Yes Yes --   Weight -- 3 lb 3 lb 3 lb --   Reps -- 10-15 10-15 10-15 --     Interval Training   Interval Training -- No No No --     Treadmill   MPH -- 1.4 -- 1.4 --   Grade -- 0.5 -- 0.5 --   Minutes -- 15 -- 15 --   METs -- 2.17 -- 2.17 --     NuStep   Level -- -- 1 3 --   SPM -- -- 80 -- --   Minutes -- -- 15 15 --   METs -- -- 2.4 1.9 --     T5 Nustep   Level -- 1 -- 2 --   Minutes -- 15 -- 15 --   METs -- 1.8 -- 1.8 --     Home Exercise Plan   Plans to continue exercise at -- -- -- -- Home (comment)  walk   Frequency -- -- -- -- Add 2 additional days to program exercise sessions.   Initial Home Exercises Provided -- -- -- -- 07/03/20   Row Name 07/08/20 1300 07/22/20 1300 08/05/20 1300         Response to Exercise   Blood Pressure (Admit) 124/76 126/62 116/68     Blood Pressure (Exercise) 152/80 132/76 142/76     Blood Pressure (Exit) 130/74 118/68 128/82     Heart Rate (Admit) 96 bpm 87 bpm 86 bpm     Heart Rate (Exercise) 106 bpm 100 bpm 96 bpm     Heart Rate (Exit) 92 bpm 87 bpm 87 bpm     Rating of Perceived Exertion (Exercise) 15 11 11      Symptoms none none none     Duration Continue with 30 min of aerobic exercise without signs/symptoms of physical distress. Continue with 30 min of aerobic exercise without signs/symptoms of physical distress. Continue with 30 min of aerobic exercise without signs/symptoms of physical distress.     Intensity THRR unchanged THRR unchanged THRR unchanged       Progression   Progression Continue to progress workloads to maintain intensity without signs/symptoms of physical distress. Continue to progress workloads to maintain intensity without signs/symptoms of physical distress. Continue to progress workloads to maintain intensity without signs/symptoms of physical distress.     Average METs 1.5 1.9 1.5       Resistance Training   Training Prescription Yes Yes Yes      Weight 3 lb 3 lb 5 lb     Reps 10-15 10-15 10-15       Interval Training   Interval Training No No No       NuStep   Level 3 6 3      SPM 50 -- 80     Minutes 15 15  15     METs 2 1.9 1.5       T5 Nustep   Level -- 2 --     Minutes -- 15 --     METs -- 1.9 --       Biostep-RELP   Level 1 -- --     Minutes 15 -- --     METs 1 -- --       Home Exercise Plan   Plans to continue exercise at -- Home (comment)  walk Home (comment)  walk     Frequency -- Add 2 additional days to program exercise sessions. Add 2 additional days to program exercise sessions.     Initial Home Exercises Provided -- 07/03/20 07/03/20            Exercise Comments:  Exercise Comments    Row Name 04/22/20 1018 06/17/20 1122         Exercise Comments : First full day of exercise!  Patient was oriented to gym and equipment including functions, settings, policies, and procedures.  Patient's individual exercise prescription and treatment plan were reviewed.  All starting workloads were established based on the results of the 6 minute walk test done at initial orientation visit.  The plan for exercise progression was also introduced and progression will be customized based on patient's performance and goals. After 10 minutes on treadmill, pt experienced leg cramps "I haven't walked that much in a while". Exercise stopped, VSS. Pt sat down until resistance which he tolerated without difficulty.             Exercise Goals and Review:  Exercise Goals    Row Name 04/15/20 1538             Exercise Goals   Increase Physical Activity Yes       Intervention Provide advice, education, support and counseling about physical activity/exercise needs.;Develop an individualized exercise prescription for aerobic and resistive training based on initial evaluation findings, risk stratification, comorbidities and participant's personal goals.       Expected Outcomes Short Term: Attend rehab on a regular basis to  increase amount of physical activity.;Long Term: Add in home exercise to make exercise part of routine and to increase amount of physical activity.;Long Term: Exercising regularly at least 3-5 days a week.       Increase Strength and Stamina Yes       Intervention Provide advice, education, support and counseling about physical activity/exercise needs.;Develop an individualized exercise prescription for aerobic and resistive training based on initial evaluation findings, risk stratification, comorbidities and participant's personal goals.       Expected Outcomes Short Term: Increase workloads from initial exercise prescription for resistance, speed, and METs.;Short Term: Perform resistance training exercises routinely during rehab and add in resistance training at home;Long Term: Improve cardiorespiratory fitness, muscular endurance and strength as measured by increased METs and functional capacity (6MWT)       Able to understand and use rate of perceived exertion (RPE) scale Yes       Intervention Provide education and explanation on how to use RPE scale       Expected Outcomes Short Term: Able to use RPE daily in rehab to express subjective intensity level;Long Term:  Able to use RPE to guide intensity level when exercising independently       Able to understand and use Dyspnea scale Yes       Intervention Provide education and explanation on how to use Dyspnea scale  Expected Outcomes Short Term: Able to use Dyspnea scale daily in rehab to express subjective sense of shortness of breath during exertion;Long Term: Able to use Dyspnea scale to guide intensity level when exercising independently       Knowledge and understanding of Target Heart Rate Range (THRR) Yes       Intervention Provide education and explanation of THRR including how the numbers were predicted and where they are located for reference       Expected Outcomes Short Term: Able to state/look up THRR;Short Term: Able to use daily  as guideline for intensity in rehab;Long Term: Able to use THRR to govern intensity when exercising independently       Able to check pulse independently Yes       Intervention Provide education and demonstration on how to check pulse in carotid and radial arteries.;Review the importance of being able to check your own pulse for safety during independent exercise       Expected Outcomes Short Term: Able to explain why pulse checking is important during independent exercise;Long Term: Able to check pulse independently and accurately       Understanding of Exercise Prescription Yes       Intervention Provide education, explanation, and written materials on patient's individual exercise prescription       Expected Outcomes Short Term: Able to explain program exercise prescription;Long Term: Able to explain home exercise prescription to exercise independently              Exercise Goals Re-Evaluation :  Exercise Goals Re-Evaluation    Row Name 04/22/20 1018 04/29/20 1520 05/26/20 1221 06/05/20 1007 06/10/20 1210     Exercise Goal Re-Evaluation   Exercise Goals Review Knowledge and understanding of Target Heart Rate Range (THRR);Able to understand and use rate of perceived exertion (RPE) scale;Understanding of Exercise Prescription Increase Physical Activity;Increase Strength and Stamina;Understanding of Exercise Prescription -- Increase Physical Activity;Increase Strength and Stamina Increase Physical Activity;Increase Strength and Stamina;Able to understand and use rate of perceived exertion (RPE) scale   Comments Reviewed RPE and dyspnea scales, THR and program prescription with pt today.  Pt voiced understanding and was given a copy of goals to take home. Beryl has only attended one session thus far and is having a cath/stent done today.  He will need clearance to return.  We will continue to monitor his progress. Out since last review with gout/blisters.  Waiting for clearance to return. Eliav  has been doing a little walking and using his 2 pounds weights at home. He is starting back rehab since he had his stents put in two weeks ago. This is Seanpatrick' first week back.  he has only done seated machines so far.  Staff will monitor progress.   Expected Outcomes Short: Use RPE daily to regulate intensity. Long: Follow program prescription in THR. Short: Clearance to return to rehab  Long: Continue to follow program prescription. -- Short: continue to attend [facility] regularly. Long: increase exercise loads and exerises at home. Short:  get back to regular attendance Long: increase stamina and MET level   Row Name 06/24/20 1313 07/03/20 1022 07/08/20 1328 07/22/20 1330 07/24/20 1002     Exercise Goal Re-Evaluation   Exercise Goals Review Increase Physical Activity;Increase Strength and Stamina;Able to understand and use rate of perceived exertion (RPE) scale Increase Physical Activity;Increase Strength and Stamina;Able to understand and use rate of perceived exertion (RPE) scale Increase Physical Activity;Increase Strength and Stamina;Able to understand and use rate of perceived  exertion (RPE) scale Increase Physical Activity;Increase Strength and Stamina;Understanding of Exercise Prescription Increase Physical Activity;Increase Strength and Stamina;Understanding of Exercise Prescription   Comments Tywone has been doing well in rehab.  He will usually stay on the same piece for the full time. However, he was able to walk on the treadmill on Tuesday of last week. We will continue to monitor his progress. Reviewed home exercise with pt today.  Pt plans to walk for exercise.  Reviewed THR, pulse, oximetry and when to call 911 or MD.  Also discussed weather considerations and indoor options.  Pt voiced understanding. Gorman tolerates exercise well.  He has increased to 5 lb weights for strength training. Damico is doing well in rehab.  He is now on level 6 for the NuStep.  We will continue to monitor  his progress. Mats is doing well in rehab.  He is not currently doing extra at home, but we talked about adding in at least one extra day for 3 days total.  We talked using the chair exericse video.   Expected Outcomes Short: Continue to attend regularly and try to keep walking on treadmill  Long: Continue to improve stamina and maintain spm. Short: add 2 days per week walking at home Long: maintain exercise independently Short:  continue to exercise consistently Long:  improve overall stamina Short: Continue to increase T5 workload Long: Continue to to improve stamina Short: Start to add in exercise at home  Long: Continue to improve stamina.   [place] Name 08/05/20 1345             Exercise Goal Re-Evaluation   Exercise Goals Review Increase Physical Activity;Increase Strength and Stamina;Understanding of Exercise Prescription       Comments Xaivier has moved up to 5 lb weights for strength work.  He feels his shoulders feel better and he has more range of motion.       Expected Outcomes Short: continue to work on ROm for shoulders Long: improve overall stamina              Discharge Exercise Prescription (Final Exercise Prescription Changes):  Exercise Prescription Changes - 08/05/20 1300      Response to Exercise   Blood Pressure (Admit) 116/68    Blood Pressure (Exercise) 142/76    Blood Pressure (Exit) 128/82    Heart Rate (Admit) 86 bpm    Heart Rate (Exercise) 96 bpm    Heart Rate (Exit) 87 bpm    Rating of Perceived Exertion (Exercise) 11    Symptoms none    Duration Continue with 30 min of aerobic exercise without signs/symptoms of physical distress.    Intensity THRR unchanged      Progression   Progression Continue to progress workloads to maintain intensity without signs/symptoms of physical distress.    Average METs 1.5      Resistance Training   Training Prescription Yes    Weight 5 lb    Reps 10-15      Interval Training   Interval Training No      NuStep    Level 3    SPM 80    Minutes 15    METs 1.5      Home Exercise Plan   Plans to continue exercise at Home (comment)   walk   Frequency Add 2 additional days to program exercise sessions.    Initial Home Exercises Provided 07/03/20           Nutrition:  Target Goals: Understanding of  nutrition guidelines, daily intake of sodium '1500mg'$ , cholesterol '200mg'$ , calories 30% from fat and 7% or less from saturated fats, daily to have 5 or more servings of fruits and vegetables.  Education: Controlling Sodium/Reading Food Labels -Group verbal and written material supporting the discussion of sodium use in heart healthy nutrition. Review and explanation with models, verbal and written materials for utilization of the food label.   Education: General Nutrition Guidelines/Fats and Fiber: -Group instruction provided by verbal, written material, models and posters to present the general guidelines for heart healthy nutrition. Gives an explanation and review of dietary fats and fiber.   Biometrics:  Pre Biometrics - 04/15/20 1538      Pre Biometrics   Height 6' 0.9" (1.852 m)    Weight 298 lb 8 oz (135.4 kg)    BMI (Calculated) 39.48    Single Leg Stand 0 seconds            Nutrition Therapy Plan and Nutrition Goals:  Nutrition Therapy & Goals - 06/20/20 0819      Nutrition Therapy   Diet Heart healthy, low sodium, T2DM    Drug/Food Interactions Purine/Gout    Protein (specify units) 105g    Fiber 30 grams    Whole Grain Foods 3 servings    Saturated Fats 12 max. grams    Fruits and Vegetables 5 servings/day    Sodium 1.5 grams      Personal Nutrition Goals   Nutrition Goal ST: review paperwork LT: Lower sodium, lower unhealthy fats, lower red meat intake    Comments Pt daughter on call with patient. T2DM: took A1c yesterday. Lantus insulin. B: 2 clementines, jimmy deam sausage x4, nutrilite ? or biscuit from [employer] or eggs, bacon and cheese (breakfast bowl) S: grapes or  mixed nuts or yogurt (greek) or fruit bars L: 2-3 vegetables and a protein (ex given - chicken pot pie) a lot of baked chicken. Pt was told to limit water intake (still water retention in feet) D: vegetable plate or baked chicken with two vegetables at [employer]. Discussed MyPlate, diabetes MNT, gout MNT, low sodium and gave some suggestions. Will review paperwork with pt and give to review with daughter. Next consultation would like to explore why patient relies so heavily on convienience foods.      Intervention Plan   Intervention Nutrition handout(s) given to patient.;Prescribe, educate and counsel regarding individualized specific dietary modifications aiming towards targeted core components such as weight, hypertension, lipid management, diabetes, heart failure and other comorbidities.    Expected Outcomes Short Term Goal: A plan has been developed with personal nutrition goals set during dietitian appointment.;Long Term Goal: Adherence to prescribed nutrition plan.;Short Term Goal: Understand basic principles of dietary content, such as calories, fat, sodium, cholesterol and nutrients.           Nutrition Assessments:  Nutrition Assessments - 04/15/20 1539      MEDFICTS Scores   Pre Score 38           MEDIFICTS Score Key:          ?70 Need to make dietary changes          40-70 Heart Healthy Diet         ? 40 Therapeutic Level Cholesterol Diet  Nutrition Goals Re-Evaluation:  Nutrition Goals Re-Evaluation    [place] Name 07/24/20 1008             Goals   Nutrition Goal Back to diet  Comment Dayvon has been cheating on his diet this week and it has shown up in his weight and blood sugars.  He is determined to get back to it.       Expected Outcome Short: Get back to diet  Long: Continue to eat heart healthy.              Nutrition Goals Discharge (Final Nutrition Goals Re-Evaluation):  Nutrition Goals Re-Evaluation - 07/24/20 1008      Goals   Nutrition  Goal Back to diet    Comment Bertram has been cheating on his diet this week and it has shown up in his weight and blood sugars.  He is determined to get back to it.    Expected Outcome Short: Get back to diet  Long: Continue to eat heart healthy.           Psychosocial: Target Goals: Acknowledge presence or absence of significant depression and/or stress, maximize coping skills, provide positive support system. Participant is able to verbalize types and ability to use techniques and skills needed for reducing stress and depression.   Education: Depression - Provides group verbal and written instruction on the correlation between heart/lung disease and depressed mood, treatment options, and the stigmas associated with seeking treatment.   Cardiac Rehab from 07/24/2020 in [facility] Cardiac and Pulmonary Rehab  Date 07/03/20  Educator SB  Instruction Review Code 1- [employer] Understanding      Education: Sleep Hygiene -Provides group verbal and written instruction about how sleep can affect your health.  Define sleep hygiene, discuss sleep cycles and impact of sleep habits. Review good sleep hygiene tips.     Education: Stress and Anxiety: - Provides group verbal and written instruction about the health risks of elevated stress and causes of high stress.  Discuss the correlation between heart/lung disease and anxiety and treatment options. Review healthy ways to manage with stress and anxiety.   Cardiac Rehab from 07/24/2020 in [facility] Cardiac and Pulmonary Rehab  Date 07/03/20  Educator SB  Instruction Review Code 1- Verbalizes Understanding       Initial Review & Psychosocial Screening:  Initial Psych Review & Screening - 04/10/20 1446      Initial Review   Current issues with Current Anxiety/Panic;History of Depression;Current Psychotropic Meds;Current Depression;Current Stress Concerns    Source of Stress Concerns Chronic Illness;Retirement/disability    Comments On disability and  retired, Would like to have more mobility, long history of bipolar disorder but feels well managed currently and denies current symptoms.      Family Dynamics   Good Support System? Yes   ex-wife, daughter, and sister (8 brothers and sister) all local     Barriers   Psychosocial barriers to participate in program The patient should benefit from training in stress management and relaxation.;Psychosocial barriers identified (see note)      Screening Interventions   Interventions Encouraged to exercise;To provide support and resources with identified psychosocial needs;Provide feedback about the scores to participant    Expected Outcomes Long Term Goal: Stressors or current issues are controlled or eliminated.;Short Term goal: Utilizing psychosocial counselor, staff and physician to assist with identification of specific Stressors or current issues interfering with healing process. Setting desired goal for each stressor or current issue identified.;Short Term goal: Identification and review with participant of any Quality of Life or Depression concerns found by scoring the questionnaire.;Long Term goal: The participant improves quality of Life and PHQ9 Scores as seen by post scores and/or verbalization of  changes           Quality of Life Scores:   Quality of Life - 04/15/20 1539      Quality of Life   Select Quality of Life      Quality of Life Scores   Health/Function Pre 18 %    Socioeconomic Pre 19 %    Psych/Spiritual Pre 18.86 %    Family Pre 25.5 %    GLOBAL Pre 19.35 %          Scores of 19 and below usually indicate a poorer quality of life in these areas.  A difference of  2-3 points is a clinically meaningful difference.  A difference of 2-3 points in the total score of the Quality of Life Index has been associated with significant improvement in overall quality of life, self-image, physical symptoms, and general health in studies assessing change in quality of  life.  PHQ-9: Recent Review Flowsheet Data    Depression screen [facility] 2/9 04/15/2020 10/09/2015   Decreased Interest 0 1   Down, Depressed, Hopeless 0 1   PHQ - 2 Score 0 2   Altered sleeping 0 1   Tired, decreased energy 1 1   Change in appetite 2 2   Feeling bad or failure about yourself  1 1   Trouble concentrating 0 0   Moving slowly or fidgety/restless 0 1   Suicidal thoughts 0 0   PHQ-9 Score 4 8   Difficult doing work/chores Not difficult at all Not difficult at all     Interpretation of Total Score  Total Score Depression Severity:  1-4 = Minimal depression, 5-9 = Mild depression, 10-14 = Moderate depression, 15-19 = Moderately severe depression, 20-27 = Severe depression   Psychosocial Evaluation and Intervention:  Psychosocial Evaluation - 04/10/20 1453      Psychosocial Evaluation & Interventions   Interventions Stress management education;Encouraged to exercise with the program and follow exercise prescription    Comments 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  He has a long history of bipolar disorder but is medically managed and feels good currently.  He is divorced but has a good relationship with his ex-wife. He also is close to his daughter. They two of them will be sharing the task of getting him to rehab each day.  He is also one of 9 brother and sisters all of which, minus 2, leave within the county.  He is hoping that rehab will supplement his PT/OT and help improve his mobility.  He is also looking foward to getting out to meet people again.   Exercise will help with mood and strengthen for him.    Expected Outcomes Short: Attend rehab regularly for mood boost and strength Long: Continued compliance on therapy and stable mood.    Continue Psychosocial Services  Follow up required by staff           Psychosocial Re-Evaluation:  Psychosocial Re-Evaluation    Row Name 06/05/20 1012 07/03/20 1026 07/24/20 1004         Psychosocial Re-Evaluation    Current issues with History of Depression;Current Depression;Current Psychotropic Meds;Current Stress Concerns -- History of Depression;Current Psychotropic Meds;Current Stress Concerns;Current Sleep Concerns     Comments Jaquawn is feeling positive about his health and looks to his daughter, ex wife and other family memebers for support. Anias is feeling good.  He says he sleeps well -  especially with his CPAP. Lafayette is doing well.  He is  having dificulty sleeping.  His meds knock him out before he is able to get his CPAP on at night.  He is still very tried but denies any other depression symtoms.  He feels tired most of the day and drugged.     Expected Outcomes Short: continue exercise to reduce stress. Long: maintain a positive outlook on his health independently. Short:  continue to exercise and practice good sleep habits Long: maintain positive outlook SHort: Talk to doc about meds and sleep  Long: COnitnue to stay positive     Interventions Encouraged to attend Cardiac Rehabilitation for the exercise -- Encouraged to attend Cardiac Rehabilitation for the exercise     Continue Psychosocial Services  Follow up required by staff -- Follow up required by staff            Psychosocial Discharge (Final Psychosocial Re-Evaluation):  Psychosocial Re-Evaluation - 07/24/20 1004      Psychosocial Re-Evaluation   Current issues with History of Depression;Current Psychotropic Meds;Current Stress Concerns;Current Sleep Concerns    Comments Thurmond is doing well.  He is having dificulty sleeping.  His meds knock him out before he is able to get his CPAP on at night.  He is still very tried but denies any other depression symtoms.  He feels tired most of the day and drugged.    Expected Outcomes SHort: Talk to doc about meds and sleep  Long: COnitnue to stay positive    Interventions Encouraged to attend Cardiac Rehabilitation for the exercise    Continue Psychosocial Services  Follow up required  by staff           Vocational Rehabilitation: Provide vocational rehab assistance to qualifying candidates.   Vocational Rehab Evaluation & Intervention:  Vocational Rehab - 04/10/20 1446      Initial Vocational Rehab Evaluation & Intervention   Assessment shows need for Vocational Rehabilitation No   disabled/retired          Education: Education Goals: Education classes will be provided on a variety of topics geared toward better understanding of heart health and risk factor modification. Participant will state understanding/return demonstration of topics presented as noted by education test scores.  Learning Barriers/Preferences:  Learning Barriers/Preferences - 04/10/20 1445      Learning Barriers/Preferences   Learning Barriers Sight   glasss   Learning Preferences None           General Cardiac Education Topics:  AED/CPR: - Group verbal and written instruction with the use of models to demonstrate the basic use of the AED with the basic ABC's of resuscitation.   Anatomy & Physiology of the Heart: - Group verbal and written instruction and models provide basic cardiac anatomy and physiology, with the coronary electrical and arterial systems. Review of Valvular disease and Heart Failure   Cardiac Procedures: - Group verbal and written instruction to review commonly prescribed medications for heart disease. Reviews the medication, class of the drug, and side effects. Includes the steps to properly store meds and maintain the prescription regimen. (beta blockers and nitrates)   Cardiac Rehab from 07/24/2020 in [facility] Cardiac and Pulmonary Rehab  Date 07/24/20  Educator SB  Instruction Review Code 1- Verbalizes Understanding      Cardiac Medications I: - Group verbal and written instruction to review commonly prescribed medications for heart disease. Reviews the medication, class of the drug, and side effects. Includes the steps to properly store meds and maintain  the prescription regimen.   Cardiac Rehab from 07/24/2020  in [facility] Cardiac and Pulmonary Rehab  Date 06/26/20  Educator SB  Instruction Review Code 1- Verbalizes Understanding      Cardiac Medications II: -Group verbal and written instruction to review commonly prescribed medications for heart disease. Reviews the medication, class of the drug, and side effects. (all other drug classes)   Cardiac Rehab from 07/24/2020 in [facility] Cardiac and Pulmonary Rehab  Date 07/10/20  Educator SB  Instruction Review Code 1- Verbalizes Understanding       Go Sex-Intimacy & Heart Disease, Get SMART - Goal Setting: - Group verbal and written instruction through game format to discuss heart disease and the return to sexual intimacy. Provides group verbal and written material to discuss and apply goal setting through the application of the S.M.A.R.T. Method.   Cardiac Rehab from 07/24/2020 in [facility] Cardiac and Pulmonary Rehab  Date 07/24/20  Educator SB  Instruction Review Code 1- Verbalizes Understanding      Other Matters of the Heart: - Provides group verbal, written materials and models to describe Stable Angina and Peripheral Artery. Includes description of the disease process and treatment options available to the cardiac patient.   Infection Prevention: - Provides verbal and written material to individual with discussion of infection control including proper hand washing and proper equipment cleaning during exercise session.   Cardiac Rehab from 07/24/2020 in [facility] Cardiac and Pulmonary Rehab  Date 04/15/20  Educator [employer]  Instruction Review Code 1- Verbalizes Understanding      Falls Prevention: - Provides verbal and written material to individual with discussion of falls prevention and safety.   Cardiac Rehab from 07/24/2020 in [facility] Cardiac and Pulmonary Rehab  Date 04/15/20  Educator [facility]  Instruction Review Code 1- Verbalizes Understanding      Other: -Provides group and verbal  instruction on various topics (see comments)   Knowledge Questionnaire Score:  Knowledge Questionnaire Score - 04/15/20 1539      Knowledge Questionnaire Score   Pre Score 23/26 Education Focus: Angina, Nutrition, Exercise           Core Components/Risk Factors/Patient Goals at Admission:  Personal Goals and Risk Factors at Admission - 04/15/20 1540      Core Components/Risk Factors/Patient Goals on Admission    Weight Management Yes;Weight Loss;Obesity    Intervention Weight Management: Develop a combined nutrition and exercise program designed to reach desired caloric intake, while maintaining appropriate intake of nutrient and fiber, sodium and fats, and appropriate energy expenditure required for the weight goal.;Weight Management: Provide education and appropriate resources to help participant work on and attain dietary goals.;Weight Management/Obesity: Establish reasonable short term and long term weight goals.;Obesity: Provide education and appropriate resources to help participant work on and attain dietary goals.    Admit Weight 298 lb 8 oz (135.4 kg)    Goal Weight: Short Term 293 lb (132.9 kg)    Goal Weight: Long Term 288 lb (130.6 kg)    Expected Outcomes Short Term: Continue to assess and modify interventions until short term weight is achieved;Long Term: Adherence to nutrition and physical activity/exercise program aimed toward attainment of established weight goal;Weight Loss: Understanding of general recommendations for a balanced deficit meal plan, which promotes 1-2 lb weight loss per week and includes a negative energy balance of [phone] kcal/d;Understanding recommendations for meals to include 15-35% energy as protein, 25-35% energy from fat, 35-60% energy from carbohydrates, less than $RemoveB'200mg'ZWvymMqT$  of dietary cholesterol, 20-35 gm of total fiber daily;Understanding of distribution of calorie intake throughout the day with  the consumption of 4-5 meals/snacks    Diabetes Yes     Intervention Provide education about signs/symptoms and action to take for hypo/hyperglycemia.;Provide education about proper nutrition, including hydration, and aerobic/resistive exercise prescription along with prescribed medications to achieve blood glucose in normal ranges: Fasting glucose 65-99 mg/dL    Expected Outcomes Short Term: Participant verbalizes understanding of the signs/symptoms and immediate care of hyper/hypoglycemia, proper foot care and importance of medication, aerobic/resistive exercise and nutrition plan for blood glucose control.;Long Term: Attainment of HbA1C < 7%.    Heart Failure Yes    Intervention Provide a combined exercise and nutrition program that is supplemented with education, support and counseling about heart failure. Directed toward relieving symptoms such as shortness of breath, decreased exercise tolerance, and extremity edema.    Expected Outcomes Improve functional capacity of life;Short term: Attendance in program 2-3 days a week with increased exercise capacity. Reported lower sodium intake. Reported increased fruit and vegetable intake. Reports medication compliance.;Short term: Daily weights obtained and reported for increase. Utilizing diuretic protocols set by physician.;Long term: Adoption of self-care skills and reduction of barriers for early signs and symptoms recognition and intervention leading to self-care maintenance.    Hypertension Yes    Intervention Provide education on lifestyle modifcations including regular physical activity/exercise, weight management, moderate sodium restriction and increased consumption of fresh fruit, vegetables, and low fat dairy, alcohol moderation, and smoking cessation.;Monitor prescription use compliance.    Expected Outcomes Long Term: Maintenance of blood pressure at goal levels.;Short Term: Continued assessment and intervention until BP is < 140/56mm HG in hypertensive participants. < 130/64mm HG in hypertensive  participants with diabetes, heart failure or chronic kidney disease.    Lipids Yes    Intervention Provide education and support for participant on nutrition & aerobic/resistive exercise along with prescribed medications to achieve LDL '70mg'$ , HDL >$Remo'40mg'oHcWw$ .    Expected Outcomes Short Term: Participant states understanding of desired cholesterol values and is compliant with medications prescribed. Participant is following exercise prescription and nutrition guidelines.;Long Term: Cholesterol controlled with medications as prescribed, with individualized exercise RX and with personalized nutrition plan. Value goals: LDL < $Rem'70mg'Txbz$ , HDL > 40 mg.           Education:Diabetes - Individual verbal and written instruction to review signs/symptoms of diabetes, desired ranges of glucose level fasting, after meals and with exercise. Acknowledge that pre and post exercise glucose checks will be done for 3 sessions at entry of program.   Cardiac Rehab from 07/24/2020 in [facility] Cardiac and Pulmonary Rehab  Date 04/15/20  Educator [facility]  Instruction Review Code 1- Verbalizes Understanding      Education: Know Your Numbers and Risk Factors: -Group verbal and written instruction about important numbers in your health.  Discussion of what are risk factors and how they play a role in the disease process.  Review of Cholesterol, Blood Pressure, Diabetes, and BMI and the role they play in your overall health.   Cardiac Rehab from 07/24/2020 in [facility] Cardiac and Pulmonary Rehab  Date 07/10/20  Educator SB  Instruction Review Code 1- Verbalizes Understanding      Core Components/Risk Factors/Patient Goals Review:   Goals and Risk Factor Review    Row Name 06/05/20 1010 07/03/20 0956 07/24/20 1006         Core Components/Risk Factors/Patient Goals Review   Personal Goals Review Weight Management/Obesity;Diabetes;Lipids;Hypertension;Heart Failure Weight Management/Obesity;Diabetes;Lipids;Hypertension;Heart Failure Weight  Management/Obesity;Diabetes;Lipids;Hypertension;Heart Failure     Review Dominque has lost weight since the start of the  program. He is working on losing more weight and controlling his sugar. His sugar was low a few times he was in rehab and has since been getting it more under control. He is checking his glucose at home twice a day. [place] is down to 276 lb from 301 lb.  He wants to start exercising at his apartment complex.  His foot is feeling better, but he is still seeing Dr about circulation in his legs.  His BG has been running a little high but Jerimey has been off his diet.  BG normally runs about 125. Legrand is planning to talk to his doctor about his meds and they make him tired and drugged out during day.  He is so tired after taking them, that he forgets to put on his CPAP.  Blood sugars have been doing a little better, but this week he has been cheating on his diet and it shows up.  He needs to get back to it.  Blood pressures have been doing well.  He denies heart failure symptoms.   His weight has started to creep back up again.     Expected Outcomes Short: lose weight and improve blood sugar levels. long: maintain weight loss and blood sugars independently Short:  continue to work on controlling BG and weight loss Long: manage risk factors on his own Short; Get back to diet routine Long: Continue to monitor risk factors.            Core Components/Risk Factors/Patient Goals at Discharge (Final Review):   Goals and Risk Factor Review - 07/24/20 1006      Core Components/Risk Factors/Patient Goals Review   Personal Goals Review Weight Management/Obesity;Diabetes;Lipids;Hypertension;Heart Failure    Review Bartholomew is planning to talk to his doctor about his meds and they make him tired and drugged out during day.  He is so tired after taking them, that he forgets to put on his CPAP.  Blood sugars have been doing a little better, but this week he has been cheating on his diet and it shows  up.  He needs to get back to it.  Blood pressures have been doing well.  He denies heart failure symptoms.   His weight has started to creep back up again.    Expected Outcomes Short; Get back to diet routine Long: Continue to monitor risk factors.           ITP Comments:  ITP Comments    Row Name 04/10/20 1504 04/15/20 1535 04/16/20 0846 04/22/20 1018 04/29/20 1521   ITP Comments Completed virtual orientation today.  EP evaluation is scheduled for Tuesday 5/18 at 1pm.  Documentation for diagnosis can be found in [facility] encounter 03/31/20. Completed 6MWT and gym orientation.  Initial ITP created and sent for review to Dr. Emily Filbert, Medical Director. 30 Day review completed. Medical Director review done, changes made as directed,and approval shown by signature of Medical Director. : First full day of exercise!  Patient was oriented to gym and equipment including functions, settings, policies, and procedures.  Patient's individual exercise prescription and treatment plan were reviewed.  All starting workloads were established based on the results of the 6 minute walk test done at initial orientation visit.  The plan for exercise progression was also introduced and progression will be customized based on patient's performance and goals. Brahm is having a cath/stent procedure done today and will be out the rest of the week.   [place] Name 05/13/20 1339 05/14/20 0615 05/26/20 1221  06/11/20 1037 07/09/20 0627   ITP Comments Abhay has not attended since last review. 30 Day review completed. Medical Director ITP review done, changes made as directed, and signed approval by Medical Director. Called daughter, Sharl Ma,  to check up on pt.  He was out with gout and swelling in his foot.  The gout has caused blisters and more swelling.  They are supposed to see doctor today about his foot and possible wound care.  He was finally able to get a shoe on this weekend. Sharl Ma will let us know what the doctor says and his  plans going forward.  Also, let them know about the schedule change coming up starting next week. 30 Day review completed. Medical Director ITP review done, changes made as directed, and signed approval by Medical Director. 30 Day review completed. Medical Director ITP review done, changes made as directed, and signed approval by Medical Director.   [place] Name 08/06/20 1612           ITP Comments 30 day review completed. ITP sent to Dr. Emily Filbert, Medical Director of Cardiac and Pulmonary Rehab. Continue with ITP unless changes are made by physician.              Comments: 30 day review

## 2020-08-11 DIAGNOSIS — N183 Chronic kidney disease, stage 3 unspecified: Secondary | ICD-10-CM | POA: Diagnosis not present

## 2020-08-11 DIAGNOSIS — I2510 Atherosclerotic heart disease of native coronary artery without angina pectoris: Secondary | ICD-10-CM | POA: Diagnosis not present

## 2020-08-11 DIAGNOSIS — R600 Localized edema: Secondary | ICD-10-CM | POA: Diagnosis not present

## 2020-08-12 ENCOUNTER — Other Ambulatory Visit: Payer: Self-pay

## 2020-08-12 ENCOUNTER — Encounter: Payer: Medicare PPO | Admitting: *Deleted

## 2020-08-12 DIAGNOSIS — I214 Non-ST elevation (NSTEMI) myocardial infarction: Secondary | ICD-10-CM | POA: Diagnosis not present

## 2020-08-12 NOTE — Progress Notes (Signed)
Daily Session Note  Patient Details  Name: KARY COLAIZZI MRN: [id number] Date of Birth: September 12, 1951 Referring Provider:     Cardiac Rehab from 04/15/2020 in [facility] Cardiac and Pulmonary Rehab  Referring Provider Neoma Laming  MD      Encounter Date: 08/12/2020  Check In:  Session Check In - 08/12/20 1044      Check-In   Supervising physician immediately available to respond to emergencies See telemetry face sheet for immediately available ER MD    Location [facility]-Cardiac & Pulmonary Rehab    Staff Present Heath Lark, RN, BSN, Jacklynn Bue, MS Exercise Physiologist;Amanda Oletta Darter, [place], ACSM CEP, Exercise Physiologist    Virtual Visit No    Medication changes reported     No    Fall or balance concerns reported    No    Warm-up and Cool-down Performed on first and last piece of equipment    Resistance Training Performed Yes    VAD Patient? No    PAD/SET Patient? No      Pain Assessment   Currently in Pain? No/denies              Social History   Tobacco Use  Smoking Status Never Smoker  Smokeless Tobacco Never Used    Goals Met:  Independence with exercise equipment Exercise tolerated well No report of cardiac concerns or symptoms  Goals Unmet:  Not Applicable  Comments: Pt able to follow exercise prescription today without complaint.  Will continue to monitor for progression.    Dr. Emily Filbert is Medical Director for [place] and [name].

## 2020-08-14 ENCOUNTER — Other Ambulatory Visit: Payer: Self-pay

## 2020-08-14 ENCOUNTER — Encounter: Payer: Medicare PPO | Admitting: *Deleted

## 2020-08-14 DIAGNOSIS — I214 Non-ST elevation (NSTEMI) myocardial infarction: Secondary | ICD-10-CM | POA: Diagnosis not present

## 2020-08-14 NOTE — Progress Notes (Signed)
Daily Session Note  Patient Details  Name: Christian [name] MRN: [id number] Date of Birth: 1950/12/04 Referring Provider:     Cardiac Rehab from 04/15/2020 in [facility] Cardiac and Pulmonary Rehab  Referring Provider Neoma Laming  MD      Encounter Date: 08/14/2020  Check In:  Session Check In - 08/14/20 0953      Check-In   Supervising physician immediately available to respond to emergencies See telemetry face sheet for immediately available ER MD    Location [facility]-Cardiac & Pulmonary Rehab    Staff Present Renita Papa, RN BSN;Jessica Luan Pulling, MA, RCEP, CCRP, CCET;Melissa Candelero Abajo RDN, LDN;Amanda Sommer, BA, ACSM CEP, Exercise Physiologist    Virtual Visit No    Medication changes reported     Yes    Comments increased Lasix from 40 to 27m    Fall or balance concerns reported    No    Warm-up and Cool-down Performed on first and last piece of equipment    Resistance Training Performed Yes    VAD Patient? No    PAD/SET Patient? No      Pain Assessment   Currently in Pain? No/denies              Social History   Tobacco Use  Smoking Status Never Smoker  Smokeless Tobacco Never Used    Goals Met:  Independence with exercise equipment Exercise tolerated well No report of cardiac concerns or symptoms Strength training completed today  Goals Unmet:  Not Applicable  Comments: Pt able to follow exercise prescription today without complaint.  Will continue to monitor for progression.    Dr. MEmily Filbertis Medical Director for [name].

## 2020-08-19 ENCOUNTER — Other Ambulatory Visit: Payer: Self-pay

## 2020-08-19 ENCOUNTER — Encounter: Payer: Medicare PPO | Admitting: *Deleted

## 2020-08-19 DIAGNOSIS — I214 Non-ST elevation (NSTEMI) myocardial infarction: Secondary | ICD-10-CM | POA: Diagnosis not present

## 2020-08-19 LAB — GLUCOSE, CAPILLARY: Glucose-Capillary: 182 mg/dL — ABNORMAL HIGH (ref 70–99)

## 2020-08-19 NOTE — Progress Notes (Signed)
Daily Session Note  Patient Details  Name: Christian [name] MRN: [id number] Date of Birth: 09/30/1951 Referring Provider:     Cardiac Rehab from 04/15/2020 in [facility] Cardiac and Pulmonary Rehab  Referring Provider Neoma Laming  MD      Encounter Date: 08/19/2020  Check In:  Session Check In - 08/19/20 1039      Check-In   Supervising physician immediately available to respond to emergencies See telemetry face sheet for immediately available ER MD    Location [facility]-Cardiac & Pulmonary Rehab    Staff Present Heath Lark, RN, BSN, Jacklynn Bue, MS Exercise Physiologist;Amanda Oletta Darter, [place], ACSM CEP, Exercise Physiologist    Virtual Visit No    Medication changes reported     No    Fall or balance concerns reported    No    Warm-up and Cool-down Performed on first and last piece of equipment    Resistance Training Performed Yes    VAD Patient? No    PAD/SET Patient? No      Pain Assessment   Currently in Pain? No/denies              Social History   Tobacco Use  Smoking Status Never Smoker  Smokeless Tobacco Never Used    Goals Met:  Independence with exercise equipment Exercise tolerated well No report of cardiac concerns or symptoms  Goals Unmet:  Not Applicable  Comments: Pt able to follow exercise prescription today without complaint.  Will continue to monitor for progression.    Dr. Emily Filbert is Medical Director for [place] and [name].

## 2020-08-21 ENCOUNTER — Other Ambulatory Visit: Payer: Self-pay

## 2020-08-21 ENCOUNTER — Encounter: Payer: Medicare PPO | Admitting: *Deleted

## 2020-08-21 DIAGNOSIS — I214 Non-ST elevation (NSTEMI) myocardial infarction: Secondary | ICD-10-CM

## 2020-08-21 NOTE — Progress Notes (Signed)
Daily Session Note  Patient Details  Name: Christian [name] MRN: [id number] Date of Birth: [birth year] Referring Provider:     Cardiac Rehab from 04/15/2020 in [facility] Cardiac and Pulmonary Rehab  Referring Provider Neoma Laming  MD      Encounter Date: 08/21/2020  Check In:  Session Check In - 08/21/20 1025      Check-In   Supervising physician immediately available to respond to emergencies See telemetry face sheet for immediately available ER MD    Location [facility]-Cardiac & Pulmonary Rehab    Staff Present Renita Papa, RN BSN;Susanne Bice, RN, BSN, Jacklynn Bue, MS Exercise Physiologist;Amanda Oletta Darter, BA, ACSM CEP, Exercise Physiologist;Melissa Caiola RDN, LDN    Virtual Visit No    Medication changes reported     No    Fall or balance concerns reported    No    Warm-up and Cool-down Performed on first and last piece of equipment    Resistance Training Performed Yes    VAD Patient? No    PAD/SET Patient? No      Pain Assessment   Currently in Pain? No/denies              Social History   Tobacco Use  Smoking Status Never Smoker  Smokeless Tobacco Never Used    Goals Met:  Independence with exercise equipment Exercise tolerated well No report of cardiac concerns or symptoms Strength training completed today  Goals Unmet:  Not Applicable  Comments: Pt able to follow exercise prescription today without complaint.  Will continue to monitor for progression.    Dr. Emily Filbert is Medical Director for [place] and [name].

## 2020-08-25 ENCOUNTER — Other Ambulatory Visit: Payer: Self-pay

## 2020-08-25 ENCOUNTER — Ambulatory Visit (INDEPENDENT_AMBULATORY_CARE_PROVIDER_SITE_OTHER): Payer: Medicare PPO | Admitting: Podiatry

## 2020-08-25 DIAGNOSIS — E1149 Type 2 diabetes mellitus with other diabetic neurological complication: Secondary | ICD-10-CM | POA: Diagnosis not present

## 2020-08-25 DIAGNOSIS — I739 Peripheral vascular disease, unspecified: Secondary | ICD-10-CM

## 2020-08-25 DIAGNOSIS — L97329 Non-pressure chronic ulcer of left ankle with unspecified severity: Secondary | ICD-10-CM | POA: Diagnosis not present

## 2020-08-25 DIAGNOSIS — I83023 Varicose veins of left lower extremity with ulcer of ankle: Secondary | ICD-10-CM | POA: Diagnosis not present

## 2020-08-25 MED ORDER — DOXYCYCLINE HYCLATE 100 MG PO TABS: 100.0000 mg | ORAL_TABLET | Freq: Two times a day (BID) | ORAL | 0 refills | Status: DC

## 2020-08-25 NOTE — Patient Instructions (Signed)
Wash wound with soap and water daily Apply a small piece of Maxorb Ag to the wound then apply ACE bandage for compression.  Keep feet elevated

## 2020-08-26 ENCOUNTER — Encounter: Payer: Medicare PPO | Admitting: *Deleted

## 2020-08-26 DIAGNOSIS — I214 Non-ST elevation (NSTEMI) myocardial infarction: Secondary | ICD-10-CM | POA: Diagnosis not present

## 2020-08-26 DIAGNOSIS — R39198 Other difficulties with micturition: Secondary | ICD-10-CM | POA: Diagnosis not present

## 2020-08-26 DIAGNOSIS — N183 Chronic kidney disease, stage 3 unspecified: Secondary | ICD-10-CM | POA: Diagnosis not present

## 2020-08-26 DIAGNOSIS — N1330 Unspecified hydronephrosis: Secondary | ICD-10-CM | POA: Diagnosis not present

## 2020-08-26 NOTE — Progress Notes (Signed)
Daily Session Note  Patient Details  Name: Christian [name] MRN: [id number] Date of Birth: August 06, 1951 Referring Provider:     Cardiac Rehab from 04/15/2020 in [facility] Cardiac and Pulmonary Rehab  Referring Provider Neoma Laming  MD      Encounter Date: 08/26/2020  Check In:  Session Check In - 08/26/20 1150      Check-In   Supervising physician immediately available to respond to emergencies See telemetry face sheet for immediately available ER MD    Location [facility]-Cardiac & Pulmonary Rehab    Staff Present Darel Hong, RN Vickki Hearing, BA, ACSM CEP, Exercise Physiologist;Johnthan Axtman, RN, BSN, CCRP    Virtual Visit No    Medication changes reported     No    Fall or balance concerns reported    No    Warm-up and Cool-down Performed on first and last piece of equipment    Resistance Training Performed Yes    VAD Patient? No    PAD/SET Patient? No      Pain Assessment   Currently in Pain? No/denies              Social History   Tobacco Use  Smoking Status Never Smoker  Smokeless Tobacco Never Used    Goals Met:  Independence with exercise equipment Exercise tolerated well No report of cardiac concerns or symptoms  Goals Unmet:  Not Applicable  Comments: Pt able to follow exercise prescription today without complaint.  Will continue to monitor for progression.    Dr. Emily Filbert is Medical Director for [place] and [name].

## 2020-08-27 NOTE — Progress Notes (Signed)
Subjective: 69 year old male presents the office today with his wife for evaluation of wounds to his feet.  States that the wounds have healed however he developed a new blister on the lateral aspect of his left calf.  He has seen another doctor for this apparently.  Denies any pus.  He does have swelling to both of his legs but his Lasix has been increased.  Currently denies any systemic complaints such as fevers, chills, nausea, vomiting. No acute changes since last appointment, and no other complaints at this time.   Objective: AAO x3, NAD DP/PT pulses decreased bilaterally Chronic swelling noted bilaterally with venous insufficiency. Ulcerations that were present previously on the dorsal aspect the left foot appears to be healed there was a second toe.  However there is a new wound which is superficial likely from an old blister in the lateral aspect calf.  Granular base is noted clear drainage.  There is no purulence.  No surrounding erythema, warmth, ascending cellulitis.  No fluctuation crepitation. No pain with calf compression, swelling, warmth, erythema  Assessment: Ulceration likely due to venous insufficiency, swelling left leg  plan: -All treatment options discussed with the patient including all alternatives, risks, complications.  -Wounds otherwise appear to be healed however new wound to lateral calf.  Maxorb AG was applied followed by Ace bandage for compression.  Encouraged elevation.  Doxycycline. -Referral to vascular surgery.  Previously had called his daughter discussed the arterial studies.  They wanted to see his cardiologist for this but does not seem that this was addressed.  Will refer for vascular surgery evaluation. -Patient encouraged to call the office with any questions, concerns, change in symptoms.   Vivi Barrack DPM

## 2020-08-28 ENCOUNTER — Encounter: Payer: Medicare PPO | Admitting: *Deleted

## 2020-08-28 ENCOUNTER — Other Ambulatory Visit: Payer: Self-pay

## 2020-08-28 DIAGNOSIS — I214 Non-ST elevation (NSTEMI) myocardial infarction: Secondary | ICD-10-CM

## 2020-08-28 NOTE — Progress Notes (Signed)
Daily Session Note  Patient Details  Name: Christian [name] MRN: [id number] Date of Birth: 12-30-1950 Referring Provider:     Cardiac Rehab from 04/15/2020 in [facility] Cardiac and Pulmonary Rehab  Referring Provider Neoma Laming  MD      Encounter Date: 08/28/2020  Check In:  Session Check In - 08/28/20 0957      Check-In   Supervising physician immediately available to respond to emergencies See telemetry face sheet for immediately available ER MD    Location [facility]-Cardiac & Pulmonary Rehab    Staff Present Heath Lark, RN, BSN, Jacklynn Bue, MS Exercise Physiologist;Amanda Oletta Darter, [place], ACSM CEP, Exercise Physiologist    Virtual Visit No    Medication changes reported     No    Fall or balance concerns reported    No    Warm-up and Cool-down Performed on first and last piece of equipment    Resistance Training Performed Yes    VAD Patient? No    PAD/SET Patient? No      Pain Assessment   Currently in Pain? No/denies              Social History   Tobacco Use  Smoking Status Never Smoker  Smokeless Tobacco Never Used    Goals Met:  Independence with exercise equipment Exercise tolerated well No report of cardiac concerns or symptoms  Goals Unmet:  Not Applicable  Comments: Pt able to follow exercise prescription today without complaint.  Will continue to monitor for progression.    Dr. Emily Filbert is Medical Director for [place] and [name].

## 2020-09-02 ENCOUNTER — Encounter: Payer: Medicare PPO | Attending: Cardiovascular Disease | Admitting: *Deleted

## 2020-09-02 ENCOUNTER — Other Ambulatory Visit: Payer: Self-pay

## 2020-09-02 DIAGNOSIS — I214 Non-ST elevation (NSTEMI) myocardial infarction: Secondary | ICD-10-CM | POA: Diagnosis not present

## 2020-09-02 NOTE — Patient Instructions (Signed)
Discharge Patient Instructions  Patient Details  Name: Christian [name] MRN: [id number] Date of Birth: [birth year] Referring Provider:  Dionisio David, MD   Number of Visits: 65  Reason for Discharge:  Patient reached a stable level of exercise. Patient independent in their exercise. Patient has met program and personal goals.  Smoking History:  Social History   Tobacco Use  Smoking Status Never Smoker  Smokeless Tobacco Never Used    Diagnosis:  NSTEMI (non-ST elevated myocardial infarction) [facility])  Initial Exercise Prescription:  Initial Exercise Prescription - 04/15/20 1500      Date of Initial Exercise RX and Referring Provider   Date 04/15/20    Referring Provider Neoma Laming  MD      Treadmill   MPH 1.4    Grade 0.5    Minutes 15    METs 2.17      NuStep   Level 1    SPM 80    Minutes 15    METs 2      T5 Nustep   Level 1    SPM 80    Minutes 15    METs 2      Biostep-RELP   Level 1    SPM 50    Minutes 15    METs 2      Prescription Details   Frequency (times per week) 2    Duration Progress to 30 minutes of continuous aerobic without signs/symptoms of physical distress      Intensity   THRR 40-80% of Max Heartrate 113-140    Ratings of Perceived Exertion 11-13    Perceived Dyspnea 0-4      Progression   Progression Continue to progress workloads to maintain intensity without signs/symptoms of physical distress.      Resistance Training   Training Prescription Yes    Weight 3 lb    Reps 10-15           Discharge Exercise Prescription (Final Exercise Prescription Changes):  Exercise Prescription Changes - 09/01/20 1000      Response to Exercise   Blood Pressure (Admit) 126/70    Blood Pressure (Exercise) 132/68    Blood Pressure (Exit) 122/68    Heart Rate (Admit) 76 bpm    Heart Rate (Exercise) 99 bpm    Heart Rate (Exit) 88 bpm    Rating of Perceived Exertion (Exercise) 11    Symptoms none    Duration Continue with 30  min of aerobic exercise without signs/symptoms of physical distress.    Intensity THRR unchanged      Progression   Progression Continue to progress workloads to maintain intensity without signs/symptoms of physical distress.    Average METs 1.5      Resistance Training   Training Prescription Yes    Weight 5 lb    Reps 10-15      Interval Training   Interval Training No      NuStep   Level 3    SPM 80    Minutes 15    METs 1.5      Home Exercise Plan   Plans to continue exercise at Home (comment)   walk   Frequency Add 2 additional days to program exercise sessions.    Initial Home Exercises Provided 07/03/20           Functional Capacity:  6 Minute Walk    Row Name 04/15/20 1535 08/21/20 1032       6 Minute Walk  Phase -- Discharge    Distance 847 feet 900 feet    Distance % Change -- 6 %    Distance Feet Change -- 53 ft    Walk Time 6 minutes 6 minutes    # of Rest Breaks 0 0    MPH 1.6 1.7    METS 2.26 2    RPE 11 13    Perceived Dyspnea  3 --    VO2 Peak 7.89 7.24    Symptoms Yes (comment) Yes (comment)    Comments SOB, hip pain 5/10 calves tight    Resting HR 86 bpm 92 bpm    Resting BP 146/72 110/64    Resting Oxygen Saturation  97 % --    Exercise Oxygen Saturation  during 6 min walk 96 % --    Max Ex. HR 125 bpm 112 bpm    Max Ex. BP 176/88 146/68    2 Minute Post BP 142/78 --           Nutrition:  Nutrition Therapy & Goals - 06/20/20 0819      Nutrition Therapy   Diet Heart healthy, low sodium, T2DM    Drug/Food Interactions Purine/Gout    Protein (specify units) 105g    Fiber 30 grams    Whole Grain Foods 3 servings    Saturated Fats 12 max. grams    Fruits and Vegetables 5 servings/day    Sodium 1.5 grams      Personal Nutrition Goals   Nutrition Goal ST: review paperwork LT: Lower sodium, lower unhealthy fats, lower red meat intake    Comments Pt daughter on call with patient. T2DM: took A1c yesterday. Lantus insulin. B: 2  clementines, jimmy deam sausage x4, nutrilite ? or biscuit from [employer] or eggs, bacon and cheese (breakfast bowl) S: grapes or mixed nuts or yogurt (greek) or fruit bars L: 2-3 vegetables and a protein (ex given - chicken pot pie) a lot of baked chicken. Pt was told to limit water intake (still water retention in feet) D: vegetable plate or baked chicken with two vegetables at [employer]. Discussed MyPlate, diabetes MNT, gout MNT, low sodium and gave some suggestions. Will review paperwork with pt and give to review with daughter. Next consultation would like to explore why patient relies so heavily on convienience foods.      Intervention Plan   Intervention Nutrition handout(s) given to patient.;Prescribe, educate and counsel regarding individualized specific dietary modifications aiming towards targeted core components such as weight, hypertension, lipid management, diabetes, heart failure and other comorbidities.    Expected Outcomes Short Term Goal: A plan has been developed with personal nutrition goals set during dietitian appointment.;Long Term Goal: Adherence to prescribed nutrition plan.;Short Term Goal: Understand basic principles of dietary content, such as calories, fat, sodium, cholesterol and nutrients.           Education Questionnaire Score:  Knowledge Questionnaire Score - 04/15/20 1539      Knowledge Questionnaire Score   Pre Score 23/26 Education Focus: Angina, Nutrition, Exercise           Goals reviewed with patient; copy given to patient.

## 2020-09-02 NOTE — Progress Notes (Signed)
Daily Session Note  Patient Details  Name: Christian [name] MRN: [id number] Date of Birth: [birth year] Referring Provider:     Cardiac Rehab from 04/15/2020 in [facility] Cardiac and Pulmonary Rehab  Referring Provider Neoma Laming  MD      Encounter Date: 09/02/2020  Check In:  Session Check In - 09/02/20 1152      Check-In   Supervising physician immediately available to respond to emergencies See telemetry face sheet for immediately available ER MD    Location [facility]-Cardiac & Pulmonary Rehab    Staff Present Heath Lark, RN, BSN, Lance Sell, BA, ACSM CEP, Exercise Physiologist;Kara Eliezer Bottom, MS Exercise Physiologist    Virtual Visit No    Medication changes reported     No    Fall or balance concerns reported    No    Warm-up and Cool-down Performed on first and last piece of equipment    Resistance Training Performed Yes    VAD Patient? No    PAD/SET Patient? No      Pain Assessment   Currently in Pain? No/denies              Social History   Tobacco Use  Smoking Status Never Smoker  Smokeless Tobacco Never Used    Goals Met:  Independence with exercise equipment Exercise tolerated well No report of cardiac concerns or symptoms  Goals Unmet:  Not Applicable  Comments: Pt able to follow exercise prescription today without complaint.  Will continue to monitor for progression.    Dr. Emily Filbert is Medical Director for [place] and [name].

## 2020-09-03 ENCOUNTER — Encounter: Payer: Self-pay | Admitting: *Deleted

## 2020-09-03 DIAGNOSIS — I214 Non-ST elevation (NSTEMI) myocardial infarction: Secondary | ICD-10-CM

## 2020-09-03 NOTE — Progress Notes (Signed)
Cardiac Individual Treatment Plan  Patient Details  Name: Christian [name] MRN: [id number] Date of Birth: Dec 18, 1950 Referring Provider:     Cardiac Rehab from 04/15/2020 in [facility] Cardiac and Pulmonary Rehab  Referring Provider Neoma Laming  MD      Initial Encounter Date:    Cardiac Rehab from 04/15/2020 in [facility] Cardiac and Pulmonary Rehab  Date 04/15/20      Visit Diagnosis: NSTEMI (non-ST elevated myocardial infarction) [facility])  Patient's Home Medications on Admission:  Current Outpatient Medications:  .  ACETAMINOPHEN EXTRA STRENGTH 500 MG tablet, Take 1,000 mg by mouth 2 (two) times daily., Disp: , Rfl:  .  amLODipine (NORVASC) 10 MG tablet, Take 1 tablet (10 mg total) by mouth daily., Disp: 30 tablet, Rfl: 0 .  ARIPiprazole (ABILIFY) 20 MG tablet, Take 1 tablet (20 mg total) by mouth daily., Disp: 30 tablet, Rfl: 0 .  aspirin EC 81 MG EC tablet, Take 1 tablet (81 mg total) by mouth daily., Disp: 30 tablet, Rfl: 0 .  atorvastatin (LIPITOR) 10 MG tablet, Take 1 tablet (10 mg total) by mouth daily at 6 PM. (Patient taking differently: Take 80 mg by mouth daily at 6 PM. ), Disp: 30 tablet, Rfl: 0 .  buPROPion (WELLBUTRIN) 75 MG tablet, Take 1 tablet (75 mg total) by mouth daily., Disp: 30 tablet, Rfl: 0 .  clopidogrel (PLAVIX) 75 MG tablet, Take by mouth., Disp: , Rfl:  .  divalproex (DEPAKOTE) 500 MG DR tablet, Take 1 tablet (500 mg total) by mouth every 12 (twelve) hours., Disp: 60 tablet, Rfl: 0 .  doxycycline (VIBRA-TABS) 100 MG tablet, Take 1 tablet (100 mg total) by mouth 2 (two) times daily., Disp: 20 tablet, Rfl: 0 .  empagliflozin (JARDIANCE) 10 MG TABS tablet, Take by mouth., Disp: , Rfl:  .  furosemide (LASIX) 20 MG tablet, Take 1 tablet (20 mg total) by mouth daily., Disp: 30 tablet, Rfl: 0 .  glimepiride (AMARYL) 2 MG tablet, Take 2 mg by mouth daily with breakfast., Disp: , Rfl:  .  insulin glargine (LANTUS SOLOSTAR) 100 UNIT/ML Solostar Pen, daily as needed., Disp: ,  Rfl:  .  levothyroxine (SYNTHROID) 25 MCG tablet, Take by mouth., Disp: , Rfl:  .  liothyronine (CYTOMEL) 25 MCG tablet, Take 25 mcg by mouth daily before breakfast. , Disp: , Rfl:  .  lisinopril (ZESTRIL) 40 MG tablet, Take 1 tablet (40 mg total) by mouth daily., Disp: 30 tablet, Rfl: 0 .  metoprolol succinate (TOPROL-XL) 50 MG 24 hr tablet, Take 1 tablet (50 mg total) by mouth daily. Take with or immediately following a meal. (Patient taking differently: Take 25 mg by mouth daily. Take with or immediately following a meal.), Disp: 60 tablet, Rfl: 3 .  mirtazapine (REMERON SOL-TAB) 15 MG disintegrating tablet, Take 1 tablet (15 mg total) by mouth at bedtime., Disp: 30 tablet, Rfl: 0 .  QUEtiapine (SEROQUEL) 25 MG tablet, Take 25 mg by mouth 2 (two) times daily. , Disp: , Rfl:  .  QUEtiapine (SEROQUEL) 300 MG tablet, Take 300 mg by mouth at bedtime., Disp: , Rfl:  .  rivaroxaban (XARELTO) 20 MG TABS tablet, Take 1 tablet (20 mg total) by mouth daily with supper., Disp: 30 tablet, Rfl: 0 .  sitaGLIPtin (JANUVIA) 25 MG tablet, Take 25 mg by mouth daily., Disp: , Rfl:  No current facility-administered medications for this visit.  Facility-Administered Medications Ordered in Other Visits:  .  ondansetron (ZOFRAN) injection 4 mg, 4 mg, Intravenous, Once  PRN, Gunnar Fusi, MD  Past Medical History: Past Medical History:  Diagnosis Date  . Bilateral lower extremity edema: chronic with venous stasis changes 06/02/2014  . Bipolar 1 disorder ([place])   . Bipolar disorder ([place])   . Chronic kidney disease   . Gout   . Gout   . Hx of blood clots   . Hypertension   . Mood swings   . OSA on CPAP 06/02/2014  . Other and unspecified hyperlipidemia 06/02/2014  . Type II or unspecified type diabetes mellitus with unspecified complication, uncontrolled 06/02/2014    Tobacco Use: Social History   Tobacco Use  Smoking Status Never Smoker  Smokeless Tobacco Never Used    Labs: Recent Review Flowsheet  Data    Labs for ITP Cardiac and Pulmonary Rehab Latest Ref Rng & Units 09/01/2015 12/28/2015 01/30/2020 03/17/2020 03/31/2020   Cholestrol 0 - 200 mg/dL - 108 - - -   LDLCALC 0 - 99 mg/dL - 63 - - -   HDL >40 mg/dL - 33(L) - - -   Trlycerides <150 mg/dL - 62 - - -   Hemoglobin A1c 4.8 - 5.6 % 7.5(H) - 7.1(H) - 8.7(H)   PHART 7.35 - 7.45 7.51(H) - - 7.40 -   PCO2ART 32 - 48 mmHg 27(L) - - 41 -   HCO3 20.0 - 28.0 mmol/L 21.5 - - 25.4 -   TCO2 0 - 100 mmol/L - - - - -   ACIDBASEDEF 0.0 - 2.0 mmol/L 0.3 - - - -   O2SAT % 98.2 - - 90.6 -       Exercise Target Goals: Exercise Program Goal: Individual exercise prescription set using results from initial 6 min walk test and THRR while considering  patient's activity barriers and safety.   Exercise Prescription Goal: Initial exercise prescription builds to 30-45 minutes a day of aerobic activity, 2-3 days per week.  Home exercise guidelines will be given to patient during program as part of exercise prescription that the participant will acknowledge.   Education: Aerobic Exercise & Resistance Training: - Gives group verbal and written instruction on the various components of exercise. Focuses on aerobic and resistive training programs and the benefits of this training and how to safely progress through these programs..   Cardiac Rehab from 08/21/2020 in [facility] Cardiac and Pulmonary Rehab  Date 07/24/20  Educator [facility]  [resistance]  Instruction Review Code 1- Verbalizes Understanding      Education: Exercise & Equipment Safety: - Individual verbal instruction and demonstration of equipment use and safety with use of the equipment.   Cardiac Rehab from 08/21/2020 in [facility] Cardiac and Pulmonary Rehab  Date 04/15/20  Educator [facility]  Instruction Review Code 1- Verbalizes Understanding      Education: Exercise Physiology & General Exercise Guidelines: - Group verbal and written instruction with models to review the exercise physiology of the  cardiovascular system and associated critical values. Provides general exercise guidelines with specific guidelines to those with heart or lung disease.    Cardiac Rehab from 08/21/2020 in [facility] Cardiac and Pulmonary Rehab  Date 06/12/20  Educator [facility]  Instruction Review Code 1- Verbalizes Understanding      Education: Flexibility, Balance, Mind/Body Relaxation: Provides group verbal/written instruction on the benefits of flexibility and balance training, including mind/body exercise modes such as yoga, pilates and tai chi.  Demonstration and skill practice provided.   Activity Barriers & Risk Stratification:  Activity Barriers & Cardiac Risk Stratification - 04/15/20 1536  Activity Barriers & Cardiac Risk Stratification   Activity Barriers Balance Concerns;Assistive Device;Decreased Ventricular Function;Deconditioning;Muscular Weakness;Back Problems;Other (comment);Joint Problems    Comments occasional back pain, torn rotator cuff (bilateral)    Cardiac Risk Stratification High           6 Minute Walk:  6 Minute Walk    Row Name 04/15/20 1535 08/21/20 1032       6 Minute Walk   Phase -- Discharge    Distance 847 feet 900 feet    Distance % Change -- 6 %    Distance Feet Change -- 53 ft    Walk Time 6 minutes 6 minutes    # of Rest Breaks 0 0    MPH 1.6 1.7    METS 2.26 2    RPE 11 13    Perceived Dyspnea  3 --    VO2 Peak 7.89 7.24    Symptoms Yes (comment) Yes (comment)    Comments SOB, hip pain 5/10 calves tight    Resting HR 86 bpm 92 bpm    Resting BP 146/72 110/64    Resting Oxygen Saturation  97 % --    Exercise Oxygen Saturation  during 6 min walk 96 % --    Max Ex. HR 125 bpm 112 bpm    Max Ex. BP 176/88 146/68    2 Minute Post BP 142/78 --           Oxygen Initial Assessment:   Oxygen Re-Evaluation:   Oxygen Discharge (Final Oxygen Re-Evaluation):   Initial Exercise Prescription:  Initial Exercise Prescription - 04/15/20 1500      Date of  Initial Exercise RX and Referring Provider   Date 04/15/20    Referring Provider Neoma Laming  MD      Treadmill   MPH 1.4    Grade 0.5    Minutes 15    METs 2.17      NuStep   Level 1    SPM 80    Minutes 15    METs 2      T5 Nustep   Level 1    SPM 80    Minutes 15    METs 2      Biostep-RELP   Level 1    SPM 50    Minutes 15    METs 2      Prescription Details   Frequency (times per week) 2    Duration Progress to 30 minutes of continuous aerobic without signs/symptoms of physical distress      Intensity   THRR 40-80% of Max Heartrate 113-140    Ratings of Perceived Exertion 11-13    Perceived Dyspnea 0-4      Progression   Progression Continue to progress workloads to maintain intensity without signs/symptoms of physical distress.      Resistance Training   Training Prescription Yes    Weight 3 lb    Reps 10-15           Perform Capillary Blood Glucose checks as needed.  Exercise Prescription Changes:  Exercise Prescription Changes    Row Name 04/15/20 1500 04/29/20 1500 06/10/20 1200 06/24/20 1300 07/03/20 0900     Response to Exercise   Blood Pressure (Admit) 146/72 138/80 116/74 134/66 --   Blood Pressure (Exercise) 176/88 176/80 142/64 144/74 --   Blood Pressure (Exit) 142/78 128/60 142/74 108/66 --   Heart Rate (Admit) 86 bpm 108 bpm 105 bpm 99 bpm --   Heart Rate (  Exercise) 125 bpm 136 bpm 110 bpm 104 bpm --   Heart Rate (Exit) 90 bpm 103 bpm 96 bpm 85 bpm --   Oxygen Saturation (Admit) 97 % -- -- -- --   Oxygen Saturation (Exercise) 96 % -- -- -- --   Rating of Perceived Exertion (Exercise) _0 --   Perceived Dyspnea (Exercise) 3 -- -- -- --   Symptoms SOB, hip pain 5/10, shuffling gait SOB -- none --   Comments walk test results first full day of exercise second day back -- --   Duration -- Progress to 30 minutes of  aerobic without signs/symptoms of physical distress Progress to 30 minutes of  aerobic without signs/symptoms of  physical distress Continue with 30 min of aerobic exercise without signs/symptoms of physical distress. --   Intensity -- THRR unchanged THRR unchanged THRR unchanged --     Progression   Progression -- Continue to progress workloads to maintain intensity without signs/symptoms of physical distress. Continue to progress workloads to maintain intensity without signs/symptoms of physical distress. Continue to progress workloads to maintain intensity without signs/symptoms of physical distress. --   Average METs -- 1.98 2.4 1.96 --     Resistance Training   Training Prescription -- Yes Yes Yes --   Weight -- 3 lb 3 lb 3 lb --   Reps -- 10-15 10-15 10-15 --     Interval Training   Interval Training -- No No No --     Treadmill   MPH -- 1.4 -- 1.4 --   Grade -- 0.5 -- 0.5 --   Minutes -- 15 -- 15 --   METs -- 2.17 -- 2.17 --     NuStep   Level -- -- 1 3 --   SPM -- -- 80 -- --   Minutes -- -- 15 15 --   METs -- -- 2.4 1.9 --     T5 Nustep   Level -- 1 -- 2 --   Minutes -- 15 -- 15 --   METs -- 1.8 -- 1.8 --     Home Exercise Plan   Plans to continue exercise at -- -- -- -- Home (comment)  walk   Frequency -- -- -- -- Add 2 additional days to program exercise sessions.   Initial Home Exercises Provided -- -- -- -- 07/03/20   Row Name 07/08/20 1300 07/22/20 1300 08/05/20 1300 08/19/20 1400 09/01/20 1000     Response to Exercise   Blood Pressure (Admit) 124/76 126/62 116/68 116/62 126/70   Blood Pressure (Exercise) 152/80 132/76 142/76 128/76 132/68   Blood Pressure (Exit) 130/74 118/68 128/82 112/64 122/68   Heart Rate (Admit) 96 bpm 87 bpm 86 bpm 84 bpm 76 bpm   Heart Rate (Exercise) 106 bpm 100 bpm 96 bpm 95 bpm 99 bpm   Heart Rate (Exit) 92 bpm 87 bpm 87 bpm 82 bpm 88 bpm   Rating of Perceived Exertion (Exercise) _1 Symptoms _2    Duration Continue with 30 min of aerobic exercise without signs/symptoms of physical distress. Continue with  30 min of aerobic exercise without signs/symptoms of physical distress. Continue with 30 min of aerobic exercise without signs/symptoms of physical distress. Continue with 30 min of aerobic exercise without signs/symptoms of physical distress. Continue with 30 min of aerobic exercise without signs/symptoms of physical distress.   Intensity _3   Progression   Progression Continue to progress workloads to maintain intensity without signs/symptoms of physical distress. Continue to progress workloads to maintain intensity without signs/symptoms of physical distress. Continue to progress workloads to maintain intensity without signs/symptoms of physical distress. Continue to progress workloads to maintain intensity without signs/symptoms of physical distress. Continue to progress workloads to maintain intensity without signs/symptoms of physical distress.   Average METs 1.5 1.9 1.5 2.75 1.5     Resistance Training   Training Prescription _0    Weight 3 lb 3 lb 5 lb 5 lb 5 lb   Reps 10-15 10-15 10-15 10-15 10-15     Interval Training   Interval Training _1      NuStep   Level _2 SPM 50 -- 80 -- 80   Minutes _3 METs 2 1.9 1.5 3.6 1.5     T5 Nustep   Level -- 2 -- 3 --   Minutes -- 15 -- 48 --   METs -- 1.9 -- 1.9 --     Biostep-RELP   Level 1 -- -- -- --   Minutes 15 -- -- -- --   METs 1 -- -- -- --     Home Exercise Plan   Plans to continue exercise at -- Home (comment)  walk Home (comment)  walk Home (comment)  walk Home (comment)  walk   Frequency -- Add 2 additional days to program exercise sessions. Add 2 additional days to program exercise sessions. Add 2 additional days to program exercise sessions. Add 2 additional days to program exercise sessions.   Initial Home Exercises Provided -- 07/03/20 07/03/20 07/03/20 07/03/20          Exercise Comments:   Exercise Comments    Row Name 04/22/20 1018 06/17/20 1122         Exercise Comments : First full day of exercise!  Patient was oriented to gym and equipment including functions, settings, policies, and procedures.  Patient's individual exercise prescription and treatment plan were reviewed.  All starting workloads were established based on the results of the 6 minute walk test done at initial orientation visit.  The plan for exercise progression was also introduced and progression will be customized based on patient's performance and goals. After 10 minutes on treadmill, pt experienced leg cramps "I haven't walked that much in a while". Exercise stopped, VSS. Pt sat down until resistance which he tolerated without difficulty.             Exercise Goals and Review:  Exercise Goals    Row Name 04/15/20 1538             Exercise Goals   Increase Physical Activity Yes       Intervention Provide advice, education, support and counseling about physical activity/exercise needs.;Develop an individualized exercise prescription for aerobic and resistive training based on initial evaluation findings, risk stratification, comorbidities and participant's personal goals.       Expected Outcomes Short Term: Attend rehab on a regular basis to increase amount of physical activity.;Long Term: Add in home exercise to make exercise part of routine and to increase amount of physical activity.;Long Term: Exercising regularly at least 3-5 days a week.       Increase Strength and Stamina Yes       Intervention Provide advice, education, support and counseling about physical activity/exercise needs.;Develop an individualized exercise prescription for aerobic  and resistive training based on initial evaluation findings, risk stratification, comorbidities and participant's personal goals.       Expected Outcomes Short Term: Increase workloads from initial exercise prescription for resistance, speed, and METs.;Short  Term: Perform resistance training exercises routinely during rehab and add in resistance training at home;Long Term: Improve cardiorespiratory fitness, muscular endurance and strength as measured by increased METs and functional capacity (6MWT)       Able to understand and use rate of perceived exertion (RPE) scale Yes       Intervention Provide education and explanation on how to use RPE scale       Expected Outcomes Short Term: Able to use RPE daily in rehab to express subjective intensity level;Long Term:  Able to use RPE to guide intensity level when exercising independently       Able to understand and use Dyspnea scale Yes       Intervention Provide education and explanation on how to use Dyspnea scale       Expected Outcomes Short Term: Able to use Dyspnea scale daily in rehab to express subjective sense of shortness of breath during exertion;Long Term: Able to use Dyspnea scale to guide intensity level when exercising independently       Knowledge and understanding of Target Heart Rate Range (THRR) Yes       Intervention Provide education and explanation of THRR including how the numbers were predicted and where they are located for reference       Expected Outcomes Short Term: Able to state/look up THRR;Short Term: Able to use daily as guideline for intensity in rehab;Long Term: Able to use THRR to govern intensity when exercising independently       Able to check pulse independently Yes       Intervention Provide education and demonstration on how to check pulse in carotid and radial arteries.;Review the importance of being able to check your own pulse for safety during independent exercise       Expected Outcomes Short Term: Able to explain why pulse checking is important during independent exercise;Long Term: Able to check pulse independently and accurately       Understanding of Exercise Prescription Yes       Intervention Provide education, explanation, and written materials on patient's  individual exercise prescription       Expected Outcomes Short Term: Able to explain program exercise prescription;Long Term: Able to explain home exercise prescription to exercise independently              Exercise Goals Re-Evaluation :  Exercise Goals Re-Evaluation    Row Name 04/22/20 1018 04/29/20 1520 05/26/20 1221 06/05/20 1007 06/10/20 1210     Exercise Goal Re-Evaluation   Exercise Goals Review Knowledge and understanding of Target Heart Rate Range (THRR);Able to understand and use rate of perceived exertion (RPE) scale;Understanding of Exercise Prescription Increase Physical Activity;Increase Strength and Stamina;Understanding of Exercise Prescription -- Increase Physical Activity;Increase Strength and Stamina Increase Physical Activity;Increase Strength and Stamina;Able to understand and use rate of perceived exertion (RPE) scale   Comments Reviewed RPE and dyspnea scales, THR and program prescription with pt today.  Pt voiced understanding and was given a copy of goals to take home. Jerard has only attended one session thus far and is having a cath/stent done today.  He will need clearance to return.  We will continue to monitor his progress. Out since last review with gout/blisters.  Waiting for clearance to return. Kishan has been doing  a little walking and using his 2 pounds weights at home. He is starting back rehab since he had his stents put in two weeks ago. This is Constantin' first week back.  he has only done seated machines so far.  Staff will monitor progress.   Expected Outcomes Short: Use RPE daily to regulate intensity. Long: Follow program prescription in THR. Short: Clearance to return to rehab  Long: Continue to follow program prescription. -- Short: continue to attend [facility] regularly. Long: increase exercise loads and exerises at home. Short:  get back to regular attendance Long: increase stamina and MET level   Row Name 06/24/20 1313 07/03/20 1022 07/08/20 1328  07/22/20 1330 07/24/20 1002     Exercise Goal Re-Evaluation   Exercise Goals Review Increase Physical Activity;Increase Strength and Stamina;Able to understand and use rate of perceived exertion (RPE) scale Increase Physical Activity;Increase Strength and Stamina;Able to understand and use rate of perceived exertion (RPE) scale Increase Physical Activity;Increase Strength and Stamina;Able to understand and use rate of perceived exertion (RPE) scale Increase Physical Activity;Increase Strength and Stamina;Understanding of Exercise Prescription Increase Physical Activity;Increase Strength and Stamina;Understanding of Exercise Prescription   Comments Jemiah has been doing well in rehab.  He will usually stay on the same piece for the full time. However, he was able to walk on the treadmill on Tuesday of last week. We will continue to monitor his progress. Reviewed home exercise with pt today.  Pt plans to walk for exercise.  Reviewed THR, pulse, oximetry and when to call 911 or MD.  Also discussed weather considerations and indoor options.  Pt voiced understanding. Zedekiah tolerates exercise well.  He has increased to 5 lb weights for strength training. Tyrelle is doing well in rehab.  He is now on level 6 for the NuStep.  We will continue to monitor his progress. Orlandus is doing well in rehab.  He is not currently doing extra at home, but we talked about adding in at least one extra day for 3 days total.  We talked using the chair exericse video.   Expected Outcomes Short: Continue to attend regularly and try to keep walking on treadmill  Long: Continue to improve stamina and maintain spm. Short: add 2 days per week walking at home Long: maintain exercise independently Short:  continue to exercise consistently Long:  improve overall stamina Short: Continue to increase T5 workload Long: Continue to to improve stamina Short: Start to add in exercise at home  Long: Continue to improve stamina.   [place] Name 08/05/20  1345 08/14/20 0954 08/19/20 1427 09/01/20 1012       Exercise Goal Re-Evaluation   Exercise Goals Review Increase Physical Activity;Increase Strength and Stamina;Understanding of Exercise Prescription Increase Physical Activity;Increase Strength and Stamina;Understanding of Exercise Prescription Increase Physical Activity;Increase Strength and Stamina;Understanding of Exercise Prescription Increase Physical Activity;Increase Strength and Stamina;Understanding of Exercise Prescription    Comments Demetrious has moved up to 5 lb weights for strength work.  He feels his shoulders feel better and he has more range of motion. Kelton is doing well in rehab.  He still has difficulty with falling asleep on the equipment.  He has noticed that when he leaves he is sore in his pecs and has improved ROM in shoulders.  He has started a walking regimin at his apartment complex that he tries to do 4-5 times a day. Buzz has been doing well in rehab.  He is due for his post 6MWT and we hope to see an  improvement in his distance.  He is up to 3.6 METs on the NuStep.  We will continue to monitor his progress.   He plans to continue to walk at home after graduation. Constant did improve a little on his post walk test.  He doesn't walk for exercise while here - only does seated equipment.    Expected Outcomes Short: continue to work on ROm for shoulders Long: improve overall stamina Short: Continue to walk at home Long; COntinue to improve stamina. Short: Improve post 6MWT Long: Continue to exercise independently Short: complete HT program Long: maintain exercise on his own           Discharge Exercise Prescription (Final Exercise Prescription Changes):  Exercise Prescription Changes - 09/01/20 1000      Response to Exercise   Blood Pressure (Admit) 126/70    Blood Pressure (Exercise) 132/68    Blood Pressure (Exit) 122/68    Heart Rate (Admit) 76 bpm    Heart Rate (Exercise) 99 bpm    Heart Rate (Exit) 88 bpm     Rating of Perceived Exertion (Exercise) 11    Symptoms none    Duration Continue with 30 min of aerobic exercise without signs/symptoms of physical distress.    Intensity THRR unchanged      Progression   Progression Continue to progress workloads to maintain intensity without signs/symptoms of physical distress.    Average METs 1.5      Resistance Training   Training Prescription Yes    Weight 5 lb    Reps 10-15      Interval Training   Interval Training No      NuStep   Level 3    SPM 80    Minutes 15    METs 1.5      Home Exercise Plan   Plans to continue exercise at Home (comment)   walk   Frequency Add 2 additional days to program exercise sessions.    Initial Home Exercises Provided 07/03/20           Nutrition:  Target Goals: Understanding of nutrition guidelines, daily intake of sodium <1556m, cholesterol <2059m calories 30% from fat and 7% or less from saturated fats, daily to have 5 or more servings of fruits and vegetables.  Education: Controlling Sodium/Reading Food Labels -Group verbal and written material supporting the discussion of sodium use in heart healthy nutrition. Review and explanation with models, verbal and written materials for utilization of the food label.   Education: General Nutrition Guidelines/Fats and Fiber: -Group instruction provided by verbal, written material, models and posters to present the general guidelines for heart healthy nutrition. Gives an explanation and review of dietary fats and fiber.   Biometrics:  Pre Biometrics - 04/15/20 1538      Pre Biometrics   Height 6' 0.9" (1.852 m)    Weight 298 lb 8 oz (135.4 kg)    BMI (Calculated) 39.48    Single Leg Stand 0 seconds            Nutrition Therapy Plan and Nutrition Goals:  Nutrition Therapy & Goals - 06/20/20 0819      Nutrition Therapy   Diet Heart healthy, low sodium, T2DM    Drug/Food Interactions Purine/Gout    Protein (specify units) 105g    Fiber 30  grams    Whole Grain Foods 3 servings    Saturated Fats 12 max. grams    Fruits and Vegetables 5 servings/day    Sodium 1.5 grams  Personal Nutrition Goals   Nutrition Goal ST: review paperwork LT: Lower sodium, lower unhealthy fats, lower red meat intake    Comments Pt daughter on call with patient. T2DM: took A1c yesterday. Lantus insulin. B: 2 clementines, jimmy deam sausage x4, nutrilite ? or biscuit from [employer] or eggs, bacon and cheese (breakfast bowl) S: grapes or mixed nuts or yogurt (greek) or fruit bars L: 2-3 vegetables and a protein (ex given - chicken pot pie) a lot of baked chicken. Pt was told to limit water intake (still water retention in feet) D: vegetable plate or baked chicken with two vegetables at [employer]. Discussed MyPlate, diabetes MNT, gout MNT, low sodium and gave some suggestions. Will review paperwork with pt and give to review with daughter. Next consultation would like to explore why patient relies so heavily on convienience foods.      Intervention Plan   Intervention Nutrition handout(s) given to patient.;Prescribe, educate and counsel regarding individualized specific dietary modifications aiming towards targeted core components such as weight, hypertension, lipid management, diabetes, heart failure and other comorbidities.    Expected Outcomes Short Term Goal: A plan has been developed with personal nutrition goals set during dietitian appointment.;Long Term Goal: Adherence to prescribed nutrition plan.;Short Term Goal: Understand basic principles of dietary content, such as calories, fat, sodium, cholesterol and nutrients.           Nutrition Assessments:  Nutrition Assessments - 04/15/20 1539      MEDFICTS Scores   Pre Score 38           MEDIFICTS Score Key:          ?70 Need to make dietary changes          40-70 Heart Healthy Diet         ? 40 Therapeutic Level Cholesterol Diet  Nutrition Goals Re-Evaluation:  Nutrition Goals  Re-Evaluation    [place] Name 07/24/20 1008 08/14/20 1000           Goals   Nutrition Goal Back to diet Back to diet      Comment Zakai has been cheating on his diet this week and it has shown up in his weight and blood sugars.  He is determined to get back to it. Aubert contines to stay off his diet.  Today is his birthday so he gets to cheat today and is going to [employer] for lunch.  His sister has been making him cake and he feels obligated to eat it.  He wants to get back to his diet.      Expected Outcome Short: Get back to diet  Long: Continue to eat heart healthy. Short: enjoy birthday Long: Focus on healthy eating.             Nutrition Goals Discharge (Final Nutrition Goals Re-Evaluation):  Nutrition Goals Re-Evaluation - 08/14/20 1000      Goals   Nutrition Goal Back to diet    Comment Ashish contines to stay off his diet.  Today is his birthday so he gets to cheat today and is going to [employer] for lunch.  His sister has been making him cake and he feels obligated to eat it.  He wants to get back to his diet.    Expected Outcome Short: enjoy birthday Long: Focus on healthy eating.           Psychosocial: Target Goals: Acknowledge presence or absence of significant depression and/or stress, maximize coping skills, provide positive support  system. Participant is able to verbalize types and ability to use techniques and skills needed for reducing stress and depression.   Education: Depression - Provides group verbal and written instruction on the correlation between heart/lung disease and depressed mood, treatment options, and the stigmas associated with seeking treatment.   Cardiac Rehab from 08/21/2020 in [facility] Cardiac and Pulmonary Rehab  Date 07/03/20  Educator SB  Instruction Review Code 1- [employer] Understanding      Education: Sleep Hygiene -Provides group verbal and written instruction about how sleep can affect your health.  Define sleep hygiene, discuss  sleep cycles and impact of sleep habits. Review good sleep hygiene tips.     Education: Stress and Anxiety: - Provides group verbal and written instruction about the health risks of elevated stress and causes of high stress.  Discuss the correlation between heart/lung disease and anxiety and treatment options. Review healthy ways to manage with stress and anxiety.   Cardiac Rehab from 08/21/2020 in [facility] Cardiac and Pulmonary Rehab  Date 07/03/20  Educator SB  Instruction Review Code 1- Verbalizes Understanding       Initial Review & Psychosocial Screening:  Initial Psych Review & Screening - 04/10/20 1446      Initial Review   Current issues with Current Anxiety/Panic;History of Depression;Current Psychotropic Meds;Current Depression;Current Stress Concerns    Source of Stress Concerns Chronic Illness;Retirement/disability    Comments On disability and retired, Would like to have more mobility, long history of bipolar disorder but feels well managed currently and denies current symptoms.      Family Dynamics   Good Support System? Yes   ex-wife, daughter, and sister (8 brothers and sister) all local     Barriers   Psychosocial barriers to participate in program The patient should benefit from training in stress management and relaxation.;Psychosocial barriers identified (see note)      Screening Interventions   Interventions Encouraged to exercise;To provide support and resources with identified psychosocial needs;Provide feedback about the scores to participant    Expected Outcomes Long Term Goal: Stressors or current issues are controlled or eliminated.;Short Term goal: Utilizing psychosocial counselor, staff and physician to assist with identification of specific Stressors or current issues interfering with healing process. Setting desired goal for each stressor or current issue identified.;Short Term goal: Identification and review with participant of any Quality of Life or  Depression concerns found by scoring the questionnaire.;Long Term goal: The participant improves quality of Life and PHQ9 Scores as seen by post scores and/or verbalization of changes           Quality of Life Scores:   Quality of Life - 04/15/20 1539      Quality of Life   Select Quality of Life      Quality of Life Scores   Health/Function Pre 18 %    Socioeconomic Pre 19 %    Psych/Spiritual Pre 18.86 %    Family Pre 25.5 %    GLOBAL Pre 19.35 %          Scores of 19 and below usually indicate a poorer quality of life in these areas.  A difference of  2-3 points is a clinically meaningful difference.  A difference of 2-3 points in the total score of the Quality of Life Index has been associated with significant improvement in overall quality of life, self-image, physical symptoms, and general health in studies assessing change in quality of life.  PHQ-9: Recent Review Flowsheet Data    Depression screen  [facility] 2/9 04/15/2020 10/09/2015   Decreased Interest 0 1   Down, Depressed, Hopeless 0 1   PHQ - 2 Score 0 2   Altered sleeping 0 1   Tired, decreased energy 1 1   Change in appetite 2 2   Feeling bad or failure about yourself  1 1   Trouble concentrating 0 0   Moving slowly or fidgety/restless 0 1   Suicidal thoughts 0 0   PHQ-9 Score 4 8   Difficult doing work/chores Not difficult at all Not difficult at all     Interpretation of Total Score  Total Score Depression Severity:  1-4 = Minimal depression, 5-9 = Mild depression, 10-14 = Moderate depression, 15-19 = Moderately severe depression, 20-27 = Severe depression   Psychosocial Evaluation and Intervention:  Psychosocial Evaluation - 08/14/20 0959      Discharge Psychosocial Assessment & Intervention   Comments Dareion has been doing well in rehab. He has not had any manic episodes and feeling even keel mood wise. He is doing well with medications.  He will be graduating at the end of the month. He feels better and  has improved his range of motion.  One of the concerns still is his falling asleep so easily, even while he is exercising.           Psychosocial Re-Evaluation:  Psychosocial Re-Evaluation    [place] Name 06/05/20 1012 07/03/20 1026 07/24/20 1004         Psychosocial Re-Evaluation   Current issues with History of Depression;Current Depression;Current Psychotropic Meds;Current Stress Concerns -- History of Depression;Current Psychotropic Meds;Current Stress Concerns;Current Sleep Concerns     Comments Felton is feeling positive about his health and looks to his daughter, ex wife and other family memebers for support. Eleanor is feeling good.  He says he sleeps well -  especially with his CPAP. Hesham is doing well.  He is having dificulty sleeping.  His meds knock him out before he is able to get his CPAP on at night.  He is still very tried but denies any other depression symtoms.  He feels tired most of the day and drugged.     Expected Outcomes Short: continue exercise to reduce stress. Long: maintain a positive outlook on his health independently. Short:  continue to exercise and practice good sleep habits Long: maintain positive outlook SHort: Talk to doc about meds and sleep  Long: COnitnue to stay positive     Interventions Encouraged to attend Cardiac Rehabilitation for the exercise -- Encouraged to attend Cardiac Rehabilitation for the exercise     Continue Psychosocial Services  Follow up required by staff -- Follow up required by staff            Psychosocial Discharge (Final Psychosocial Re-Evaluation):  Psychosocial Re-Evaluation - 07/24/20 1004      Psychosocial Re-Evaluation   Current issues with History of Depression;Current Psychotropic Meds;Current Stress Concerns;Current Sleep Concerns    Comments Deshay is doing well.  He is having dificulty sleeping.  His meds knock him out before he is able to get his CPAP on at night.  He is still very tried but denies any other  depression symtoms.  He feels tired most of the day and drugged.    Expected Outcomes SHort: Talk to doc about meds and sleep  Long: COnitnue to stay positive    Interventions Encouraged to attend Cardiac Rehabilitation for the exercise    Continue Psychosocial Services  Follow up required by staff  Vocational Rehabilitation: Provide vocational rehab assistance to qualifying candidates.   Vocational Rehab Evaluation & Intervention:  Vocational Rehab - 04/10/20 1446      Initial Vocational Rehab Evaluation & Intervention   Assessment shows need for Vocational Rehabilitation No   disabled/retired          Education: Education Goals: Education classes will be provided on a variety of topics geared toward better understanding of heart health and risk factor modification. Participant will state understanding/return demonstration of topics presented as noted by education test scores.  Learning Barriers/Preferences:  Learning Barriers/Preferences - 04/10/20 1445      Learning Barriers/Preferences   Learning Barriers Sight   glasss   Learning Preferences None           General Cardiac Education Topics:  AED/CPR: - Group verbal and written instruction with the use of models to demonstrate the basic use of the AED with the basic ABC's of resuscitation.   Anatomy & Physiology of the Heart: - Group verbal and written instruction and models provide basic cardiac anatomy and physiology, with the coronary electrical and arterial systems. Review of Valvular disease and Heart Failure   Cardiac Procedures: - Group verbal and written instruction to review commonly prescribed medications for heart disease. Reviews the medication, class of the drug, and side effects. Includes the steps to properly store meds and maintain the prescription regimen. (beta blockers and nitrates)   Cardiac Rehab from 08/21/2020 in [facility] Cardiac and Pulmonary Rehab  Date 07/24/20  Educator SB   Instruction Review Code 1- Verbalizes Understanding      Cardiac Medications I: - Group verbal and written instruction to review commonly prescribed medications for heart disease. Reviews the medication, class of the drug, and side effects. Includes the steps to properly store meds and maintain the prescription regimen.   Cardiac Rehab from 08/21/2020 in [facility] Cardiac and Pulmonary Rehab  Date 06/26/20  Educator SB  Instruction Review Code 1- Verbalizes Understanding      Cardiac Medications II: -Group verbal and written instruction to review commonly prescribed medications for heart disease. Reviews the medication, class of the drug, and side effects. (all other drug classes)   Cardiac Rehab from 08/21/2020 in [facility] Cardiac and Pulmonary Rehab  Date 08/21/20  Educator SB  Instruction Review Code 1- Verbalizes Understanding       Go Sex-Intimacy & Heart Disease, Get SMART - Goal Setting: - Group verbal and written instruction through game format to discuss heart disease and the return to sexual intimacy. Provides group verbal and written material to discuss and apply goal setting through the application of the S.M.A.R.T. Method.   Cardiac Rehab from 08/21/2020 in [facility] Cardiac and Pulmonary Rehab  Date 07/24/20  Educator SB  Instruction Review Code 1- Verbalizes Understanding      Other Matters of the Heart: - Provides group verbal, written materials and models to describe Stable Angina and Peripheral Artery. Includes description of the disease process and treatment options available to the cardiac patient.   Infection Prevention: - Provides verbal and written material to individual with discussion of infection control including proper hand washing and proper equipment cleaning during exercise session.   Cardiac Rehab from 08/21/2020 in [facility] Cardiac and Pulmonary Rehab  Date 04/15/20  Educator [facility]  Instruction Review Code 1- Verbalizes Understanding      Falls Prevention: -  Provides verbal and written material to individual with discussion of falls prevention and safety.   Cardiac Rehab from 08/21/2020 in [facility] Cardiac  and Pulmonary Rehab  Date 04/15/20  Educator [facility]  Instruction Review Code 1- Verbalizes Understanding      Other: -Provides group and verbal instruction on various topics (see comments)   Knowledge Questionnaire Score:  Knowledge Questionnaire Score - 04/15/20 1539      Knowledge Questionnaire Score   Pre Score 23/26 Education Focus: Angina, Nutrition, Exercise           Core Components/Risk Factors/Patient Goals at Admission:  Personal Goals and Risk Factors at Admission - 04/15/20 1540      Core Components/Risk Factors/Patient Goals on Admission    Weight Management Yes;Weight Loss;Obesity    Intervention Weight Management: Develop a combined nutrition and exercise program designed to reach desired caloric intake, while maintaining appropriate intake of nutrient and fiber, sodium and fats, and appropriate energy expenditure required for the weight goal.;Weight Management: Provide education and appropriate resources to help participant work on and attain dietary goals.;Weight Management/Obesity: Establish reasonable short term and long term weight goals.;Obesity: Provide education and appropriate resources to help participant work on and attain dietary goals.    Admit Weight 298 lb 8 oz (135.4 kg)    Goal Weight: Short Term 293 lb (132.9 kg)    Goal Weight: Long Term 288 lb (130.6 kg)    Expected Outcomes Short Term: Continue to assess and modify interventions until short term weight is achieved;Long Term: Adherence to nutrition and physical activity/exercise program aimed toward attainment of established weight goal;Weight Loss: Understanding of general recommendations for a balanced deficit meal plan, which promotes 1-2 lb weight loss per week and includes a negative energy balance of [phone] kcal/d;Understanding recommendations for meals  to include 15-35% energy as protein, 25-35% energy from fat, 35-60% energy from carbohydrates, less than 210m of dietary cholesterol, 20-35 gm of total fiber daily;Understanding of distribution of calorie intake throughout the day with the consumption of 4-5 meals/snacks    Diabetes Yes    Intervention Provide education about signs/symptoms and action to take for hypo/hyperglycemia.;Provide education about proper nutrition, including hydration, and aerobic/resistive exercise prescription along with prescribed medications to achieve blood glucose in normal ranges: Fasting glucose 65-99 mg/dL    Expected Outcomes Short Term: Participant verbalizes understanding of the signs/symptoms and immediate care of hyper/hypoglycemia, proper foot care and importance of medication, aerobic/resistive exercise and nutrition plan for blood glucose control.;Long Term: Attainment of HbA1C < 7%.    Heart Failure Yes    Intervention Provide a combined exercise and nutrition program that is supplemented with education, support and counseling about heart failure. Directed toward relieving symptoms such as shortness of breath, decreased exercise tolerance, and extremity edema.    Expected Outcomes Improve functional capacity of life;Short term: Attendance in program 2-3 days a week with increased exercise capacity. Reported lower sodium intake. Reported increased fruit and vegetable intake. Reports medication compliance.;Short term: Daily weights obtained and reported for increase. Utilizing diuretic protocols set by physician.;Long term: Adoption of self-care skills and reduction of barriers for early signs and symptoms recognition and intervention leading to self-care maintenance.    Hypertension Yes    Intervention Provide education on lifestyle modifcations including regular physical activity/exercise, weight management, moderate sodium restriction and increased consumption of fresh fruit, vegetables, and low fat dairy,  alcohol moderation, and smoking cessation.;Monitor prescription use compliance.    Expected Outcomes Long Term: Maintenance of blood pressure at goal levels.;Short Term: Continued assessment and intervention until BP is < 140/919mHG in hypertensive participants. < 130/8043mG in hypertensive participants with diabetes,  heart failure or chronic kidney disease.    Lipids Yes    Intervention Provide education and support for participant on nutrition & aerobic/resistive exercise along with prescribed medications to achieve LDL <74m, HDL >441m    Expected Outcomes Short Term: Participant states understanding of desired cholesterol values and is compliant with medications prescribed. Participant is following exercise prescription and nutrition guidelines.;Long Term: Cholesterol controlled with medications as prescribed, with individualized exercise RX and with personalized nutrition plan. Value goals: LDL < 7027mHDL > 40 mg.           Education:Diabetes - Individual verbal and written instruction to review signs/symptoms of diabetes, desired ranges of glucose level fasting, after meals and with exercise. Acknowledge that pre and post exercise glucose checks will be done for 3 sessions at entry of program.   [facility]  Date 04/15/20  [facility] 1- Verbalizes Understanding      Education: Know Your Numbers and Risk Factors: -Group verbal and written instruction about important numbers in your health.  Discussion of what are risk factors and how they play a role in the disease process.  Review of Cholesterol, Blood Pressure, Diabetes, and BMI and the role they play in your overall health.   [facility]  Date 08/21/20  Educator SB  Instruction Review Code 1- Verbalizes Understanding      Core Components/Risk Factors/Patient Goals Review:   Goals and Risk Factor  Review    [place] Name 06/05/20 1010 07/03/20 0956 07/24/20 1006 08/14/20 0956       Core Components/Risk Factors/Patient Goals Review   Personal Goals Review Weight Management/Obesity;Diabetes;Lipids;Hypertension;Heart Failure Weight Management/Obesity;Diabetes;Lipids;Hypertension;Heart Failure Weight Management/Obesity;Diabetes;Lipids;Hypertension;Heart Failure Weight Management/Obesity;Diabetes;Lipids;Hypertension;Heart Failure    Review ChaJoangels lost weight since the start of the program. He is working on losing more weight and controlling his sugar. His sugar was low a few times he was in rehab and has since been getting it more under control. He is checking his glucose at home twice a day. ChaAldrin down to 276 lb from 301 lb.  He wants to start exercising at his apartment complex.  His foot is feeling better, but he is still seeing Dr about circulation in his legs.  His BG has been running a little high but ChaJahnis been off his diet.  BG normally runs about 125. ChaKaiden planning to talk to his doctor about his meds and they make him tired and drugged out during day.  He is so tired after taking them, that he forgets to put on his CPAP.  Blood sugars have been doing a little better, but this week he has been cheating on his diet and it shows up.  He needs to get back to it.  Blood pressures have been doing well.  He denies heart failure symptoms.   His weight has started to creep back up again. ChaDiontay doing well in rehab. He had a renal appointment last week and kidney function in improving.  He had a water blister that popped on his leg but he is keeping it covered.  His weight is down almost 16 lb since starting rehab.  Blood sugars have been running a little highter as he is not eating as well.  His is out of strips until able to refill on Monday.  He has some swelling in his legs, but no other heart failure symptoms.  He says he is feeling better since his stents were placed.    Expected  Outcomes Short: lose weight and improve blood sugar levels. long: maintain weight loss and blood sugars independently Short:  continue to work on controlling BG and weight loss Long: manage risk factors on his own Short; Get back to diet routine Long: Continue to monitor risk factors. Short; Get back to eating better and watch sugars closely Long: COntinue to monitor risk factors.           Core Components/Risk Factors/Patient Goals at Discharge (Final Review):   Goals and Risk Factor Review - 08/14/20 0956      Core Components/Risk Factors/Patient Goals Review   Personal Goals Review Weight Management/Obesity;Diabetes;Lipids;Hypertension;Heart Failure    Review Draco is doing well in rehab. He had a renal appointment last week and kidney function in improving.  He had a water blister that popped on his leg but he is keeping it covered.  His weight is down almost 16 lb since starting rehab.  Blood sugars have been running a little highter as he is not eating as well.  His is out of strips until able to refill on Monday.  He has some swelling in his legs, but no other heart failure symptoms.  He says he is feeling better since his stents were placed.    Expected Outcomes Short; Get back to eating better and watch sugars closely Long: COntinue to monitor risk factors.           ITP Comments:  ITP Comments    Row Name 04/10/20 1504 04/15/20 1535 04/16/20 0846 04/22/20 1018 04/29/20 1521   ITP Comments Completed virtual orientation today.  EP evaluation is scheduled for Tuesday 5/18 at 1pm.  Documentation for diagnosis can be found in [facility] encounter 03/31/20. Completed 6MWT and gym orientation.  Initial ITP created and sent for review to Dr. Emily Filbert, Medical Director. 30 Day review completed. Medical Director review done, changes made as directed,and approval shown by signature of Medical Director. : First full day of exercise!  Patient was oriented to gym and equipment including functions,  settings, policies, and procedures.  Patient's individual exercise prescription and treatment plan were reviewed.  All starting workloads were established based on the results of the 6 minute walk test done at initial orientation visit.  The plan for exercise progression was also introduced and progression will be customized based on patient's performance and goals. Nazier is having a cath/stent procedure done today and will be out the rest of the week.   [place] Name 05/13/20 1339 05/14/20 0615 05/26/20 1221 06/11/20 1037 07/09/20 0627   ITP Comments Zyshawn has not attended since last review. 30 Day review completed. Medical Director ITP review done, changes made as directed, and signed approval by Medical Director. Called daughter, Sharl Ma,  to check up on pt.  He was out with gout and swelling in his foot.  The gout has caused blisters and more swelling.  They are supposed to see doctor today about his foot and possible wound care.  He was finally able to get a shoe on this weekend. Sharl Ma will let us know what the doctor says and his plans going forward.  Also, let them know about the schedule change coming up starting next week. 30 Day review completed. Medical Director ITP review done, changes made as directed, and signed approval by Medical Director. 30 Day review completed. Medical Director ITP review done, changes made as directed, and signed approval by  Medical Director.   [place] Name 08/06/20 1612 09/03/20 0601         ITP Comments 30 day review completed. ITP sent to Dr. Emily Filbert, Medical Director of Cardiac and Pulmonary Rehab. Continue with ITP unless changes are made by physician. 30 Day review completed. Medical Director ITP review done, changes made as directed, and signed approval by Medical Director.             Comments:

## 2020-09-04 ENCOUNTER — Encounter: Payer: Medicare PPO | Admitting: *Deleted

## 2020-09-04 ENCOUNTER — Other Ambulatory Visit: Payer: Self-pay

## 2020-09-04 DIAGNOSIS — I214 Non-ST elevation (NSTEMI) myocardial infarction: Secondary | ICD-10-CM | POA: Diagnosis not present

## 2020-09-04 NOTE — Progress Notes (Signed)
Daily Session Note  Patient Details  Name: Christian [name] MRN: [id number] Date of Birth: October 25, 1951 Referring Provider:     Cardiac Rehab from 04/15/2020 in [facility]  Referring Provider Neoma Laming  MD      Encounter Date: 09/04/2020  Check In:  Session Check In - 09/04/20 1019      Check-In   Supervising physician immediately available to respond to emergencies See telemetry face sheet for immediately available ER MD    Location [facility]-Cardiac & Pulmonary Rehab    Staff Present Renita Papa, RN Vickki Hearing, BA, ACSM CEP, Exercise Physiologist;Jessica Uvalde, MA, RCEP, CCRP, Marylynn Pearson, MS Exercise Physiologist;Melissa Caiola RDN, LDN    Virtual Visit No    Medication changes reported     No    Fall or balance concerns reported    No    Warm-up and Cool-down Performed on first and last piece of equipment    Resistance Training Performed Yes    VAD Patient? No    PAD/SET Patient? No      Pain Assessment   Currently in Pain? No/denies              Social History   Tobacco Use  Smoking Status Never Smoker  Smokeless Tobacco Never Used    Goals Met:  Independence with exercise equipment Exercise tolerated well No report of cardiac concerns or symptoms Strength training completed today  Goals Unmet:  Not Applicable  Comments:  Ger graduated today from  rehab with 36 sessions completed.  Details of the patient's exercise prescription and what He needs to do in order to continue the prescription and progress were discussed with patient.  Patient was given a copy of prescription and goals.  Patient verbalized understanding.  Joevon plans to continue to exercise by walking at home.     Dr. Emily Filbert is Medical Director for [name].

## 2020-09-04 NOTE — Progress Notes (Signed)
Discharge Progress Report  Patient Details  Name: Christian [name] MRN: [id number] Date of Birth: November 14, 1951 Referring Provider:     Cardiac Rehab from 04/15/2020 in [facility]  Referring Provider Neoma Laming  MD       Number of Visits: 36  Reason for Discharge:  Patient reached a stable level of exercise. Patient independent in their exercise. Patient has met program and personal goals.  Smoking History:  Social History   Tobacco Use  Smoking Status Never Smoker  Smokeless Tobacco Never Used    Diagnosis:  NSTEMI (non-ST elevated myocardial infarction) ([place])  ADL [facility]SD:   Initial Exercise Prescription:  Initial Exercise Prescription - 04/15/20 1500      Date of Initial Exercise RX and Referring Provider   Date 04/15/20    Referring Provider Neoma Laming  MD      Treadmill   MPH 1.4    Grade 0.5    Minutes 15    METs 2.17      NuStep   Level 1    SPM 80    Minutes 15    METs 2      T5 Nustep   Level 1    SPM 80    Minutes 15    METs 2      Biostep-RELP   Level 1    SPM 50    Minutes 15    METs 2      Prescription Details   Frequency (times per week) 2    Duration Progress to 30 minutes of continuous aerobic without signs/symptoms of physical distress      Intensity   THRR 40-80% of Max Heartrate 113-140    Ratings of Perceived Exertion 11-13    Perceived Dyspnea 0-4      Progression   Progression Continue to progress workloads to maintain intensity without signs/symptoms of physical distress.      Resistance Training   Training Prescription Yes    Weight 3 lb    Reps 10-15           Discharge Exercise Prescription (Final Exercise Prescription Changes):  Exercise Prescription Changes - 09/01/20 1000      Response to Exercise   Blood Pressure (Admit) 126/70    Blood Pressure (Exercise) 132/68    Blood Pressure (Exit) 122/68    Heart Rate (Admit) 76 bpm    Heart Rate (Exercise) 99 bpm    Heart Rate (Exit)  88 bpm    Rating of Perceived Exertion (Exercise) 11    Symptoms none    Duration Continue with 30 min of aerobic exercise without signs/symptoms of physical distress.    Intensity THRR unchanged      Progression   Progression Continue to progress workloads to maintain intensity without signs/symptoms of physical distress.    Average METs 1.5      Resistance Training   Training Prescription Yes    Weight 5 lb    Reps 10-15      Interval Training   Interval Training No      NuStep   Level 3    SPM 80    Minutes 15    METs 1.5      Home Exercise Plan   Plans to continue exercise at Home (comment)   walk   Frequency Add 2 additional days to program exercise sessions.    Initial Home Exercises Provided 07/03/20           Functional Capacity:  [place] Name 04/15/20 1535 08/21/20 1032       6 Minute Walk   Phase -- Discharge    Distance 847 feet 900 feet    Distance % Change -- 6 %    Distance Feet Change -- 53 ft    Walk Time 6 minutes 6 minutes    # of Rest Breaks 0 0    MPH 1.6 1.7    METS 2.26 2    RPE 11 13    Perceived Dyspnea  3 --    VO2 Peak 7.89 7.24    Symptoms Yes (comment) Yes (comment)    Comments SOB, hip pain 5/10 calves tight    Resting HR 86 bpm 92 bpm    Resting BP 146/72 110/64    Resting Oxygen Saturation  97 % --    Exercise Oxygen Saturation  during 6 min walk 96 % --    Max Ex. HR 125 bpm 112 bpm    Max Ex. BP 176/88 146/68    2 Minute Post BP 142/78 --           Psychological, QOL, Others - Outcomes: PHQ 2/9: Depression screen [facility] 2/9 09/04/2020 04/15/2020 10/09/2015  Decreased Interest 2 0 1  Down, Depressed, Hopeless 0 0 1  PHQ - 2 Score 2 0 2  Altered sleeping 1 0 1  Tired, decreased energy '1 1 1  ' Change in appetite '2 2 2  ' Feeling bad or failure about yourself  '1 1 1  ' Trouble concentrating 0 0 0  Moving slowly or fidgety/restless 0 0 1  Suicidal thoughts 0 0 0  PHQ-9 Score '7 4 8  ' Difficult doing work/chores  Not difficult at all Not difficult at all Not difficult at all  Some recent data might be hidden    Quality of Life:  Quality of Life - 09/04/20 1114      Quality of Life Scores   Health/Function Pre 18 %    Health/Function Post 20.73 %    Health/Function % Change 15.17 %    Socioeconomic Pre 19 %    Socioeconomic Post 21.43 %    Socioeconomic % Change  12.79 %    Psych/Spiritual Pre 18.86 %    Psych/Spiritual Post 27.36 %    Psych/Spiritual % Change 45.07 %    Family Post 25.5 %    GLOBAL Pre 19.35 %    GLOBAL Post 22.94 %    GLOBAL % Change 18.55 %          Nutrition & Weight - Outcomes:  Pre Biometrics - 04/15/20 1538      Pre Biometrics   Height 6' 0.9" (1.852 m)    Weight 298 lb 8 oz (135.4 kg)    BMI (Calculated) 39.48    Single Leg Stand 0 seconds            Nutrition:  Nutrition Therapy & Goals - 06/20/20 0819      Nutrition Therapy   Diet Heart healthy, low sodium, T2DM    Drug/Food Interactions Purine/Gout    Protein (specify units) 105g    Fiber 30 grams    Whole Grain Foods 3 servings    Saturated Fats 12 max. grams    Fruits and Vegetables 5 servings/day    Sodium 1.5 grams      Personal Nutrition Goals   Nutrition Goal ST: review paperwork LT: Lower sodium, lower unhealthy fats, lower red meat intake  Comments Pt daughter on call with patient. T2DM: took A1c yesterday. Lantus insulin. B: 2 clementines, jimmy deam sausage x4, nutrilite ? or biscuit from [employer] or eggs, bacon and cheese (breakfast bowl) S: grapes or mixed nuts or yogurt (greek) or fruit bars L: 2-3 vegetables and a protein (ex given - chicken pot pie) a lot of baked chicken. Pt was told to limit water intake (still water retention in feet) D: vegetable plate or baked chicken with two vegetables at [employer]. Discussed MyPlate, diabetes MNT, gout MNT, low sodium and gave some suggestions. Will review paperwork with pt and give to review with daughter. Next consultation  would like to explore why patient relies so heavily on convienience foods.      Intervention Plan   Intervention Nutrition handout(s) given to patient.;Prescribe, educate and counsel regarding individualized specific dietary modifications aiming towards targeted core components such as weight, hypertension, lipid management, diabetes, heart failure and other comorbidities.    Expected Outcomes Short Term Goal: A plan has been developed with personal nutrition goals set during dietitian appointment.;Long Term Goal: Adherence to prescribed nutrition plan.;Short Term Goal: Understand basic principles of dietary content, such as calories, fat, sodium, cholesterol and nutrients.           Nutrition Discharge:  Nutrition Assessments - 09/04/20 1115      MEDFICTS Scores   Post Score 39           Education Questionnaire Score:  Knowledge Questionnaire Score - 09/04/20 1114      Knowledge Questionnaire Score   Post Score 21/26           Goals reviewed with patient; copy given to patient.

## 2020-09-04 NOTE — Progress Notes (Signed)
Cardiac Individual Treatment Plan  Patient Details  Name: Christian [name] MRN: [id number] Date of Birth: 28-Jun-1951 Referring Provider:     Cardiac Rehab from 04/15/2020 in [facility] Cardiac and Pulmonary Rehab  Referring Provider Neoma Laming  MD      Initial Encounter Date:    Cardiac Rehab from 04/15/2020 in [facility] Cardiac and Pulmonary Rehab  Date 04/15/20      Visit Diagnosis: NSTEMI (non-ST elevated myocardial infarction) [facility])  Patient's Home Medications on Admission:  Current Outpatient Medications:  .  ACETAMINOPHEN EXTRA STRENGTH 500 MG tablet, Take 1,000 mg by mouth 2 (two) times daily., Disp: , Rfl:  .  amLODipine (NORVASC) 10 MG tablet, Take 1 tablet (10 mg total) by mouth daily., Disp: 30 tablet, Rfl: 0 .  ARIPiprazole (ABILIFY) 20 MG tablet, Take 1 tablet (20 mg total) by mouth daily., Disp: 30 tablet, Rfl: 0 .  aspirin EC 81 MG EC tablet, Take 1 tablet (81 mg total) by mouth daily., Disp: 30 tablet, Rfl: 0 .  atorvastatin (LIPITOR) 10 MG tablet, Take 1 tablet (10 mg total) by mouth daily at 6 PM. (Patient taking differently: Take 80 mg by mouth daily at 6 PM. ), Disp: 30 tablet, Rfl: 0 .  buPROPion (WELLBUTRIN) 75 MG tablet, Take 1 tablet (75 mg total) by mouth daily., Disp: 30 tablet, Rfl: 0 .  clopidogrel (PLAVIX) 75 MG tablet, Take by mouth., Disp: , Rfl:  .  divalproex (DEPAKOTE) 500 MG DR tablet, Take 1 tablet (500 mg total) by mouth every 12 (twelve) hours., Disp: 60 tablet, Rfl: 0 .  doxycycline (VIBRA-TABS) 100 MG tablet, Take 1 tablet (100 mg total) by mouth 2 (two) times daily., Disp: 20 tablet, Rfl: 0 .  empagliflozin (JARDIANCE) 10 MG TABS tablet, Take by mouth., Disp: , Rfl:  .  furosemide (LASIX) 20 MG tablet, Take 1 tablet (20 mg total) by mouth daily., Disp: 30 tablet, Rfl: 0 .  glimepiride (AMARYL) 2 MG tablet, Take 2 mg by mouth daily with breakfast., Disp: , Rfl:  .  insulin glargine (LANTUS SOLOSTAR) 100 UNIT/ML Solostar Pen, daily as needed., Disp: ,  Rfl:  .  levothyroxine (SYNTHROID) 25 MCG tablet, Take by mouth., Disp: , Rfl:  .  liothyronine (CYTOMEL) 25 MCG tablet, Take 25 mcg by mouth daily before breakfast. , Disp: , Rfl:  .  lisinopril (ZESTRIL) 40 MG tablet, Take 1 tablet (40 mg total) by mouth daily., Disp: 30 tablet, Rfl: 0 .  metoprolol succinate (TOPROL-XL) 50 MG 24 hr tablet, Take 1 tablet (50 mg total) by mouth daily. Take with or immediately following a meal. (Patient taking differently: Take 25 mg by mouth daily. Take with or immediately following a meal.), Disp: 60 tablet, Rfl: 3 .  mirtazapine (REMERON SOL-TAB) 15 MG disintegrating tablet, Take 1 tablet (15 mg total) by mouth at bedtime., Disp: 30 tablet, Rfl: 0 .  QUEtiapine (SEROQUEL) 25 MG tablet, Take 25 mg by mouth 2 (two) times daily. , Disp: , Rfl:  .  QUEtiapine (SEROQUEL) 300 MG tablet, Take 300 mg by mouth at bedtime., Disp: , Rfl:  .  rivaroxaban (XARELTO) 20 MG TABS tablet, Take 1 tablet (20 mg total) by mouth daily with supper., Disp: 30 tablet, Rfl: 0 .  sitaGLIPtin (JANUVIA) 25 MG tablet, Take 25 mg by mouth daily., Disp: , Rfl:  No current facility-administered medications for this visit.  Facility-Administered Medications Ordered in Other Visits:  .  ondansetron (ZOFRAN) injection 4 mg, 4 mg, Intravenous, Once  PRN, Gunnar Fusi, MD  Past Medical History: Past Medical History:  Diagnosis Date  . Bilateral lower extremity edema: chronic with venous stasis changes 06/02/2014  . Bipolar 1 disorder ([place])   . Bipolar disorder ([place])   . Chronic kidney disease   . Gout   . Gout   . Hx of blood clots   . Hypertension   . Mood swings   . OSA on CPAP 06/02/2014  . Other and unspecified hyperlipidemia 06/02/2014  . Type II or unspecified type diabetes mellitus with unspecified complication, uncontrolled 06/02/2014    Tobacco Use: Social History   Tobacco Use  Smoking Status Never Smoker  Smokeless Tobacco Never Used    Labs: Recent Review Flowsheet  Data    Labs for ITP Cardiac and Pulmonary Rehab Latest Ref Rng & Units 09/01/2015 12/28/2015 01/30/2020 03/17/2020 03/31/2020   Cholestrol 0 - 200 mg/dL - 108 - - -   LDLCALC 0 - 99 mg/dL - 63 - - -   HDL >40 mg/dL - 33(L) - - -   Trlycerides <150 mg/dL - 62 - - -   Hemoglobin A1c 4.8 - 5.6 % 7.5(H) - 7.1(H) - 8.7(H)   PHART 7.35 - 7.45 7.51(H) - - 7.40 -   PCO2ART 32 - 48 mmHg 27(L) - - 41 -   HCO3 20.0 - 28.0 mmol/L 21.5 - - 25.4 -   TCO2 0 - 100 mmol/L - - - - -   ACIDBASEDEF 0.0 - 2.0 mmol/L 0.3 - - - -   O2SAT % 98.2 - - 90.6 -       Exercise Target Goals: Exercise Program Goal: Individual exercise prescription set using results from initial 6 min walk test and THRR while considering  patient's activity barriers and safety.   Exercise Prescription Goal: Initial exercise prescription builds to 30-45 minutes a day of aerobic activity, 2-3 days per week.  Home exercise guidelines will be given to patient during program as part of exercise prescription that the participant will acknowledge.   Education: Aerobic Exercise & Resistance Training: - Gives group verbal and written instruction on the various components of exercise. Focuses on aerobic and resistive training programs and the benefits of this training and how to safely progress through these programs..   Cardiac Rehab from 08/21/2020 in [facility] Cardiac and Pulmonary Rehab  Date 07/24/20  Educator [facility]  [resistance]  Instruction Review Code 1- Verbalizes Understanding      Education: Exercise & Equipment Safety: - Individual verbal instruction and demonstration of equipment use and safety with use of the equipment.   Cardiac Rehab from 08/21/2020 in [facility] Cardiac and Pulmonary Rehab  Date 04/15/20  Educator [facility]  Instruction Review Code 1- Verbalizes Understanding      Education: Exercise Physiology & General Exercise Guidelines: - Group verbal and written instruction with models to review the exercise physiology of the  cardiovascular system and associated critical values. Provides general exercise guidelines with specific guidelines to those with heart or lung disease.    Cardiac Rehab from 08/21/2020 in [facility] Cardiac and Pulmonary Rehab  Date 06/12/20  Educator [facility]  Instruction Review Code 1- Verbalizes Understanding      Education: Flexibility, Balance, Mind/Body Relaxation: Provides group verbal/written instruction on the benefits of flexibility and balance training, including mind/body exercise modes such as yoga, pilates and tai chi.  Demonstration and skill practice provided.   Activity Barriers & Risk Stratification:  Activity Barriers & Cardiac Risk Stratification - 04/15/20 1536  Activity Barriers & Cardiac Risk Stratification   Activity Barriers Balance Concerns;Assistive Device;Decreased Ventricular Function;Deconditioning;Muscular Weakness;Back Problems;Other (comment);Joint Problems    Comments occasional back pain, torn rotator cuff (bilateral)    Cardiac Risk Stratification High           6 Minute Walk:  6 Minute Walk    Row Name 04/15/20 1535 08/21/20 1032       6 Minute Walk   Phase -- Discharge    Distance 847 feet 900 feet    Distance % Change -- 6 %    Distance Feet Change -- 53 ft    Walk Time 6 minutes 6 minutes    # of Rest Breaks 0 0    MPH 1.6 1.7    METS 2.26 2    RPE 11 13    Perceived Dyspnea  3 --    VO2 Peak 7.89 7.24    Symptoms Yes (comment) Yes (comment)    Comments SOB, hip pain 5/10 calves tight    Resting HR 86 bpm 92 bpm    Resting BP 146/72 110/64    Resting Oxygen Saturation  97 % --    Exercise Oxygen Saturation  during 6 min walk 96 % --    Max Ex. HR 125 bpm 112 bpm    Max Ex. BP 176/88 146/68    2 Minute Post BP 142/78 --           Oxygen Initial Assessment:   Oxygen Re-Evaluation:   Oxygen Discharge (Final Oxygen Re-Evaluation):   Initial Exercise Prescription:  Initial Exercise Prescription - 04/15/20 1500      Date of  Initial Exercise RX and Referring Provider   Date 04/15/20    Referring Provider Neoma Laming  MD      Treadmill   MPH 1.4    Grade 0.5    Minutes 15    METs 2.17      NuStep   Level 1    SPM 80    Minutes 15    METs 2      T5 Nustep   Level 1    SPM 80    Minutes 15    METs 2      Biostep-RELP   Level 1    SPM 50    Minutes 15    METs 2      Prescription Details   Frequency (times per week) 2    Duration Progress to 30 minutes of continuous aerobic without signs/symptoms of physical distress      Intensity   THRR 40-80% of Max Heartrate 113-140    Ratings of Perceived Exertion 11-13    Perceived Dyspnea 0-4      Progression   Progression Continue to progress workloads to maintain intensity without signs/symptoms of physical distress.      Resistance Training   Training Prescription Yes    Weight 3 lb    Reps 10-15           Perform Capillary Blood Glucose checks as needed.  Exercise Prescription Changes:   Exercise Prescription Changes    Row Name 04/15/20 1500 04/29/20 1500 06/10/20 1200 06/24/20 1300 07/03/20 0900     Response to Exercise   Blood Pressure (Admit) 146/72 138/80 116/74 134/66 --   Blood Pressure (Exercise) 176/88 176/80 142/64 144/74 --   Blood Pressure (Exit) 142/78 128/60 142/74 108/66 --   Heart Rate (Admit) 86 bpm 108 bpm 105 bpm 99 bpm --   Heart  Rate (Exercise) 125 bpm 136 bpm 110 bpm 104 bpm --   Heart Rate (Exit) 90 bpm 103 bpm 96 bpm 85 bpm --   Oxygen Saturation (Admit) 97 % -- -- -- --   Oxygen Saturation (Exercise) 96 % -- -- -- --   Rating of Perceived Exertion (Exercise) 11 15 13 11  --   Perceived Dyspnea (Exercise) 3 -- -- -- --   Symptoms SOB, hip pain 5/10, shuffling gait SOB -- none --   Comments walk test results first full day of exercise second day back -- --   Duration -- Progress to 30 minutes of  aerobic without signs/symptoms of physical distress Progress to 30 minutes of  aerobic without signs/symptoms  of physical distress Continue with 30 min of aerobic exercise without signs/symptoms of physical distress. --   Intensity -- THRR unchanged THRR unchanged THRR unchanged --     Progression   Progression -- Continue to progress workloads to maintain intensity without signs/symptoms of physical distress. Continue to progress workloads to maintain intensity without signs/symptoms of physical distress. Continue to progress workloads to maintain intensity without signs/symptoms of physical distress. --   Average METs -- 1.98 2.4 1.96 --     Resistance Training   Training Prescription -- Yes Yes Yes --   Weight -- 3 lb 3 lb 3 lb --   Reps -- 10-15 10-15 10-15 --     Interval Training   Interval Training -- No No No --     Treadmill   MPH -- 1.4 -- 1.4 --   Grade -- 0.5 -- 0.5 --   Minutes -- 15 -- 15 --   METs -- 2.17 -- 2.17 --     NuStep   Level -- -- 1 3 --   SPM -- -- 80 -- --   Minutes -- -- 15 15 --   METs -- -- 2.4 1.9 --     T5 Nustep   Level -- 1 -- 2 --   Minutes -- 15 -- 15 --   METs -- 1.8 -- 1.8 --     Home Exercise Plan   Plans to continue exercise at -- -- -- -- Home (comment)  walk   Frequency -- -- -- -- Add 2 additional days to program exercise sessions.   Initial Home Exercises Provided -- -- -- -- 07/03/20   Row Name 07/08/20 1300 07/22/20 1300 08/05/20 1300 08/19/20 1400 09/01/20 1000     Response to Exercise   Blood Pressure (Admit) 124/76 126/62 116/68 116/62 126/70   Blood Pressure (Exercise) 152/80 132/76 142/76 128/76 132/68   Blood Pressure (Exit) 130/74 118/68 128/82 112/64 122/68   Heart Rate (Admit) 96 bpm 87 bpm 86 bpm 84 bpm 76 bpm   Heart Rate (Exercise) 106 bpm 100 bpm 96 bpm 95 bpm 99 bpm   Heart Rate (Exit) 92 bpm 87 bpm 87 bpm 82 bpm 88 bpm   Rating of Perceived Exertion (Exercise) 15 11 11 12 11    Symptoms none none none none none   Duration Continue with 30 min of aerobic exercise without signs/symptoms of physical distress. Continue  with 30 min of aerobic exercise without signs/symptoms of physical distress. Continue with 30 min of aerobic exercise without signs/symptoms of physical distress. Continue with 30 min of aerobic exercise without signs/symptoms of physical distress. Continue with 30 min of aerobic exercise without signs/symptoms of physical distress.   Intensity THRR unchanged THRR unchanged THRR unchanged THRR unchanged THRR unchanged  Progression   Progression Continue to progress workloads to maintain intensity without signs/symptoms of physical distress. Continue to progress workloads to maintain intensity without signs/symptoms of physical distress. Continue to progress workloads to maintain intensity without signs/symptoms of physical distress. Continue to progress workloads to maintain intensity without signs/symptoms of physical distress. Continue to progress workloads to maintain intensity without signs/symptoms of physical distress.   Average METs 1.5 1.9 1.5 2.75 1.5     Resistance Training   Training Prescription Yes Yes Yes Yes Yes   Weight 3 lb 3 lb 5 lb 5 lb 5 lb   Reps 10-15 10-15 10-15 10-15 10-15     Interval Training   Interval Training No No No No No     NuStep   Level $Remo'3 6 3 3 3   'iQcki$ SPM 50 -- 80 -- 80   Minutes $Remove'15 15 15 15 15   'PjUGDiG$ METs 2 1.9 1.5 3.6 1.5     T5 Nustep   Level -- 2 -- 3 --   Minutes -- 15 -- 48 --   METs -- 1.9 -- 1.9 --     Biostep-RELP   Level 1 -- -- -- --   Minutes 15 -- -- -- --   METs 1 -- -- -- --     Home Exercise Plan   Plans to continue exercise at -- Home (comment)  walk Home (comment)  walk Home (comment)  walk Home (comment)  walk   Frequency -- Add 2 additional days to program exercise sessions. Add 2 additional days to program exercise sessions. Add 2 additional days to program exercise sessions. Add 2 additional days to program exercise sessions.   Initial Home Exercises Provided -- 07/03/20 07/03/20 07/03/20 07/03/20          Exercise  Comments:   Exercise Comments    Row Name 04/22/20 1018 06/17/20 1122         Exercise Comments : First full day of exercise!  Patient was oriented to gym and equipment including functions, settings, policies, and procedures.  Patient's individual exercise prescription and treatment plan were reviewed.  All starting workloads were established based on the results of the 6 minute walk test done at initial orientation visit.  The plan for exercise progression was also introduced and progression will be customized based on patient's performance and goals. After 10 minutes on treadmill, pt experienced leg cramps "I haven't walked that much in a while". Exercise stopped, VSS. Pt sat down until resistance which he tolerated without difficulty.             Exercise Goals and Review:   Exercise Goals    Row Name 04/15/20 1538             Exercise Goals   Increase Physical Activity Yes       Intervention Provide advice, education, support and counseling about physical activity/exercise needs.;Develop an individualized exercise prescription for aerobic and resistive training based on initial evaluation findings, risk stratification, comorbidities and participant's personal goals.       Expected Outcomes Short Term: Attend rehab on a regular basis to increase amount of physical activity.;Long Term: Add in home exercise to make exercise part of routine and to increase amount of physical activity.;Long Term: Exercising regularly at least 3-5 days a week.       Increase Strength and Stamina Yes       Intervention Provide advice, education, support and counseling about physical activity/exercise needs.;Develop an individualized exercise prescription  for aerobic and resistive training based on initial evaluation findings, risk stratification, comorbidities and participant's personal goals.       Expected Outcomes Short Term: Increase workloads from initial exercise prescription for resistance, speed, and  METs.;Short Term: Perform resistance training exercises routinely during rehab and add in resistance training at home;Long Term: Improve cardiorespiratory fitness, muscular endurance and strength as measured by increased METs and functional capacity ( )       Able to understand and use rate of perceived exertion (RPE) scale Yes       Intervention Provide education and explanation on how to use RPE scale       Expected Outcomes Short Term: Able to use RPE daily in rehab to express subjective intensity level;Long Term:  Able to use RPE to guide intensity level when exercising independently       Able to understand and use Dyspnea scale Yes       Intervention Provide education and explanation on how to use Dyspnea scale       Expected Outcomes Short Term: Able to use Dyspnea scale daily in rehab to express subjective sense of shortness of breath during exertion;Long Term: Able to use Dyspnea scale to guide intensity level when exercising independently       Knowledge and understanding of Target Heart Rate Range (THRR) Yes       Intervention Provide education and explanation of THRR including how the numbers were predicted and where they are located for reference       Expected Outcomes Short Term: Able to state/look up THRR;Short Term: Able to use daily as guideline for intensity in rehab;Long Term: Able to use THRR to govern intensity when exercising independently       Able to check pulse independently Yes       Intervention Provide education and demonstration on how to check pulse in carotid and radial arteries.;Review the importance of being able to check your own pulse for safety during independent exercise       Expected Outcomes Short Term: Able to explain why pulse checking is important during independent exercise;Long Term: Able to check pulse independently and accurately       Understanding of Exercise Prescription Yes       Intervention Provide education, explanation, and written materials  on patient's individual exercise prescription       Expected Outcomes Short Term: Able to explain program exercise prescription;Long Term: Able to explain home exercise prescription to exercise independently              Exercise Goals Re-Evaluation :  Exercise Goals Re-Evaluation    Row Name 04/22/20 1018 04/29/20 1520 05/26/20 1221 06/05/20 1007 06/10/20 1210     Exercise Goal Re-Evaluation   Exercise Goals Review Knowledge and understanding of Target Heart Rate Range (THRR);Able to understand and use rate of perceived exertion (RPE) scale;Understanding of Exercise Prescription Increase Physical Activity;Increase Strength and Stamina;Understanding of Exercise Prescription -- Increase Physical Activity;Increase Strength and Stamina Increase Physical Activity;Increase Strength and Stamina;Able to understand and use rate of perceived exertion (RPE) scale   Comments Reviewed RPE and dyspnea scales, THR and program prescription with pt today.  Pt voiced understanding and was given a copy of goals to take home. Davidmichael has only attended one session thus far and is having a cath/stent done today.  He will need clearance to return.  We will continue to monitor his progress. Out since last review with gout/blisters.  Waiting for clearance to return. Jazper has  been doing a little walking and using his 2 pounds weights at home. He is starting back rehab since he had his stents put in two weeks ago. This is Chett' first week back.  he has only done seated machines so far.  Staff will monitor progress.   Expected Outcomes Short: Use RPE daily to regulate intensity. Long: Follow program prescription in THR. Short: Clearance to return to rehab  Long: Continue to follow program prescription. -- Short: continue to attend [facility] regularly. Long: increase exercise loads and exerises at home. Short:  get back to regular attendance Long: increase stamina and MET level   Row Name 06/24/20 1313 07/03/20 1022  07/08/20 1328 07/22/20 1330 07/24/20 1002     Exercise Goal Re-Evaluation   Exercise Goals Review Increase Physical Activity;Increase Strength and Stamina;Able to understand and use rate of perceived exertion (RPE) scale Increase Physical Activity;Increase Strength and Stamina;Able to understand and use rate of perceived exertion (RPE) scale Increase Physical Activity;Increase Strength and Stamina;Able to understand and use rate of perceived exertion (RPE) scale Increase Physical Activity;Increase Strength and Stamina;Understanding of Exercise Prescription Increase Physical Activity;Increase Strength and Stamina;Understanding of Exercise Prescription   Comments Jakson has been doing well in rehab.  He will usually stay on the same piece for the full time. However, he was able to walk on the treadmill on Tuesday of last week. We will continue to monitor his progress. Reviewed home exercise with pt today.  Pt plans to walk for exercise.  Reviewed THR, pulse, oximetry and when to call 911 or MD.  Also discussed weather considerations and indoor options.  Pt voiced understanding. Leonell tolerates exercise well.  He has increased to 5 lb weights for strength training. Kamil is doing well in rehab.  He is now on level 6 for the NuStep.  We will continue to monitor his progress. Kailo is doing well in rehab.  He is not currently doing extra at home, but we talked about adding in at least one extra day for 3 days total.  We talked using the chair exericse video.   Expected Outcomes Short: Continue to attend regularly and try to keep walking on treadmill  Long: Continue to improve stamina and maintain spm. Short: add 2 days per week walking at home Long: maintain exercise independently Short:  continue to exercise consistently Long:  improve overall stamina Short: Continue to increase T5 workload Long: Continue to to improve stamina Short: Start to add in exercise at home  Long: Continue to improve stamina.   [place]  Name 08/05/20 1345 08/14/20 0954 08/19/20 1427 09/01/20 1012       Exercise Goal Re-Evaluation   Exercise Goals Review Increase Physical Activity;Increase Strength and Stamina;Understanding of Exercise Prescription Increase Physical Activity;Increase Strength and Stamina;Understanding of Exercise Prescription Increase Physical Activity;Increase Strength and Stamina;Understanding of Exercise Prescription Increase Physical Activity;Increase Strength and Stamina;Understanding of Exercise Prescription    Comments Demarr has moved up to 5 lb weights for strength work.  He feels his shoulders feel better and he has more range of motion. Yanis is doing well in rehab.  He still has difficulty with falling asleep on the equipment.  He has noticed that when he leaves he is sore in his pecs and has improved ROM in shoulders.  He has started a walking regimin at his apartment complex that he tries to do 4-5 times a day. [place] has been doing well in rehab.  He is due for his post 6MWT and we hope to  see an improvement in his distance.  He is up to 3.6 METs on the NuStep.  We will continue to monitor his progress.   He plans to continue to walk at home after graduation. Zayed did improve a little on his post walk test.  He doesn't walk for exercise while here - only does seated equipment.    Expected Outcomes Short: continue to work on ROm for shoulders Long: improve overall stamina Short: Continue to walk at home Long; COntinue to improve stamina. Short: Improve post 6MWT Long: Continue to exercise independently Short: complete HT program Long: maintain exercise on his own           Discharge Exercise Prescription (Final Exercise Prescription Changes):  Exercise Prescription Changes - 09/01/20 1000      Response to Exercise   Blood Pressure (Admit) 126/70    Blood Pressure (Exercise) 132/68    Blood Pressure (Exit) 122/68    Heart Rate (Admit) 76 bpm    Heart Rate (Exercise) 99 bpm    Heart Rate  (Exit) 88 bpm    Rating of Perceived Exertion (Exercise) 11    Symptoms none    Duration Continue with 30 min of aerobic exercise without signs/symptoms of physical distress.    Intensity THRR unchanged      Progression   Progression Continue to progress workloads to maintain intensity without signs/symptoms of physical distress.    Average METs 1.5      Resistance Training   Training Prescription Yes    Weight 5 lb    Reps 10-15      Interval Training   Interval Training No      NuStep   Level 3    SPM 80    Minutes 15    METs 1.5      Home Exercise Plan   Plans to continue exercise at Home (comment)   walk   Frequency Add 2 additional days to program exercise sessions.    Initial Home Exercises Provided 07/03/20           Nutrition:  Target Goals: Understanding of nutrition guidelines, daily intake of sodium '1500mg'$ , cholesterol '200mg'$ , calories 30% from fat and 7% or less from saturated fats, daily to have 5 or more servings of fruits and vegetables.  Education: Controlling Sodium/Reading Food Labels -Group verbal and written material supporting the discussion of sodium use in heart healthy nutrition. Review and explanation with models, verbal and written materials for utilization of the food label.   Education: General Nutrition Guidelines/Fats and Fiber: -Group instruction provided by verbal, written material, models and posters to present the general guidelines for heart healthy nutrition. Gives an explanation and review of dietary fats and fiber.   Biometrics:  Pre Biometrics - 04/15/20 1538      Pre Biometrics   Height 6' 0.9" (1.852 m)    Weight 298 lb 8 oz (135.4 kg)    BMI (Calculated) 39.48    Single Leg Stand 0 seconds            Nutrition Therapy Plan and Nutrition Goals:  Nutrition Therapy & Goals - 06/20/20 0819      Nutrition Therapy   Diet Heart healthy, low sodium, T2DM    Drug/Food Interactions Purine/Gout    Protein (specify units)  105g    Fiber 30 grams    Whole Grain Foods 3 servings    Saturated Fats 12 max. grams    Fruits and Vegetables 5 servings/day    Sodium  1.5 grams      Personal Nutrition Goals   Nutrition Goal ST: review paperwork LT: Lower sodium, lower unhealthy fats, lower red meat intake    Comments Pt daughter on call with patient. T2DM: took A1c yesterday. Lantus insulin. B: 2 clementines, jimmy deam sausage x4, nutrilite ? or biscuit from [employer] or eggs, bacon and cheese (breakfast bowl) S: grapes or mixed nuts or yogurt (greek) or fruit bars L: 2-3 vegetables and a protein (ex given - chicken pot pie) a lot of baked chicken. Pt was told to limit water intake (still water retention in feet) D: vegetable plate or baked chicken with two vegetables at [employer]. Discussed MyPlate, diabetes MNT, gout MNT, low sodium and gave some suggestions. Will review paperwork with pt and give to review with daughter. Next consultation would like to explore why patient relies so heavily on convienience foods.      Intervention Plan   Intervention Nutrition handout(s) given to patient.;Prescribe, educate and counsel regarding individualized specific dietary modifications aiming towards targeted core components such as weight, hypertension, lipid management, diabetes, heart failure and other comorbidities.    Expected Outcomes Short Term Goal: A plan has been developed with personal nutrition goals set during dietitian appointment.;Long Term Goal: Adherence to prescribed nutrition plan.;Short Term Goal: Understand basic principles of dietary content, such as calories, fat, sodium, cholesterol and nutrients.           Nutrition Assessments:  Nutrition Assessments - 09/04/20 1115      MEDFICTS Scores   Post Score 39           MEDIFICTS Score Key:          ?70 Need to make dietary changes          40-70 Heart Healthy Diet         ? 40 Therapeutic Level Cholesterol Diet  Nutrition Goals Re-Evaluation:   Nutrition Goals Re-Evaluation    [place] Name 07/24/20 1008 08/14/20 1000           Goals   Nutrition Goal Back to diet Back to diet      Comment Zaydon has been cheating on his diet this week and it has shown up in his weight and blood sugars.  He is determined to get back to it. Pilot contines to stay off his diet.  Today is his birthday so he gets to cheat today and is going to [employer] for lunch.  His sister has been making him cake and he feels obligated to eat it.  He wants to get back to his diet.      Expected Outcome Short: Get back to diet  Long: Continue to eat heart healthy. Short: enjoy birthday Long: Focus on healthy eating.             Nutrition Goals Discharge (Final Nutrition Goals Re-Evaluation):  Nutrition Goals Re-Evaluation - 08/14/20 1000      Goals   Nutrition Goal Back to diet    Comment Clayden contines to stay off his diet.  Today is his birthday so he gets to cheat today and is going to [employer] for lunch.  His sister has been making him cake and he feels obligated to eat it.  He wants to get back to his diet.    Expected Outcome Short: enjoy birthday Long: Focus on healthy eating.           Psychosocial: Target Goals: Acknowledge presence or absence of significant depression and/or  stress, maximize coping skills, provide positive support system. Participant is able to verbalize types and ability to use techniques and skills needed for reducing stress and depression.   Education: Depression - Provides group verbal and written instruction on the correlation between heart/lung disease and depressed mood, treatment options, and the stigmas associated with seeking treatment.   Cardiac Rehab from 08/21/2020 in [facility] Cardiac and Pulmonary Rehab  Date 07/03/20  Educator SB  Instruction Review Code 1- [employer] Understanding      Education: Sleep Hygiene -Provides group verbal and written instruction about how sleep can affect your health.  Define sleep  hygiene, discuss sleep cycles and impact of sleep habits. Review good sleep hygiene tips.     Education: Stress and Anxiety: - Provides group verbal and written instruction about the health risks of elevated stress and causes of high stress.  Discuss the correlation between heart/lung disease and anxiety and treatment options. Review healthy ways to manage with stress and anxiety.   Cardiac Rehab from 08/21/2020 in [facility] Cardiac and Pulmonary Rehab  Date 07/03/20  Educator SB  Instruction Review Code 1- Verbalizes Understanding       Initial Review & Psychosocial Screening:  Initial Psych Review & Screening - 04/10/20 1446      Initial Review   Current issues with Current Anxiety/Panic;History of Depression;Current Psychotropic Meds;Current Depression;Current Stress Concerns    Source of Stress Concerns Chronic Illness;Retirement/disability    Comments On disability and retired, Would like to have more mobility, long history of bipolar disorder but feels well managed currently and denies current symptoms.      Family Dynamics   Good Support System? Yes   ex-wife, daughter, and sister (8 brothers and sister) all local     Barriers   Psychosocial barriers to participate in program The patient should benefit from training in stress management and relaxation.;Psychosocial barriers identified (see note)      Screening Interventions   Interventions Encouraged to exercise;To provide support and resources with identified psychosocial needs;Provide feedback about the scores to participant    Expected Outcomes Long Term Goal: Stressors or current issues are controlled or eliminated.;Short Term goal: Utilizing psychosocial counselor, staff and physician to assist with identification of specific Stressors or current issues interfering with healing process. Setting desired goal for each stressor or current issue identified.;Short Term goal: Identification and review with participant of any Quality of  Life or Depression concerns found by scoring the questionnaire.;Long Term goal: The participant improves quality of Life and PHQ9 Scores as seen by post scores and/or verbalization of changes           Quality of Life Scores:   Quality of Life - 09/04/20 1114      Quality of Life Scores   Health/Function Pre 18 %    Health/Function Post 20.73 %    Health/Function % Change 15.17 %    Socioeconomic Pre 19 %    Socioeconomic Post 21.43 %    Socioeconomic % Change  12.79 %    Psych/Spiritual Pre 18.86 %    Psych/Spiritual Post 27.36 %    Psych/Spiritual % Change 45.07 %    Family Post 25.5 %    GLOBAL Pre 19.35 %    GLOBAL Post 22.94 %    GLOBAL % Change 18.55 %          Scores of 19 and below usually indicate a poorer quality of life in these areas.  A difference of  2-3 points is a clinically  meaningful difference.  A difference of 2-3 points in the total score of the Quality of Life Index has been associated with significant improvement in overall quality of life, self-image, physical symptoms, and general health in studies assessing change in quality of life.  PHQ-9: Recent Review Flowsheet Data    Depression screen [facility] 2/9 09/04/2020 04/15/2020 10/09/2015   Decreased Interest 2 0 1   Down, Depressed, Hopeless 0 0 1   PHQ - 2 Score 2 0 2   Altered sleeping 1 0 1   Tired, decreased energy $RemoveBeforeDE'1 1 1   'SwBTvAJHpnnDeHq$ Change in appetite $RemoveBef'2 2 2   'KCTmZWosix$ Feeling bad or failure about yourself  $RemoveB'1 1 1   'MGmANjEI$ Trouble concentrating 0 0 0   Moving slowly or fidgety/restless 0 0 1   Suicidal thoughts 0 0 0   PHQ-9 Score $RemoveBef'7 4 8   'rtIWhyzzaI$ Difficult doing work/chores Not difficult at all Not difficult at all Not difficult at all     Interpretation of Total Score  Total Score Depression Severity:  1-4 = Minimal depression, 5-9 = Mild depression, 10-14 = Moderate depression, 15-19 = Moderately severe depression, 20-27 = Severe depression   Psychosocial Evaluation and Intervention:  Psychosocial Evaluation - 08/14/20 0959       Discharge Psychosocial Assessment & Intervention   Comments Drako has been doing well in rehab. He has not had any manic episodes and feeling even keel mood wise. He is doing well with medications.  He will be graduating at the end of the month. He feels better and has improved his range of motion.  One of the concerns still is his falling asleep so easily, even while he is exercising.           Psychosocial Re-Evaluation:  Psychosocial Re-Evaluation    [place] Name 06/05/20 1012 07/03/20 1026 07/24/20 1004         Psychosocial Re-Evaluation   Current issues with History of Depression;Current Depression;Current Psychotropic Meds;Current Stress Concerns -- History of Depression;Current Psychotropic Meds;Current Stress Concerns;Current Sleep Concerns     Comments Wilkie is feeling positive about his health and looks to his daughter, ex wife and other family memebers for support. Tibor is feeling good.  He says he sleeps well -  especially with his CPAP. Deloyd is doing well.  He is having dificulty sleeping.  His meds knock him out before he is able to get his CPAP on at night.  He is still very tried but denies any other depression symtoms.  He feels tired most of the day and drugged.     Expected Outcomes Short: continue exercise to reduce stress. Long: maintain a positive outlook on his health independently. Short:  continue to exercise and practice good sleep habits Long: maintain positive outlook SHort: Talk to doc about meds and sleep  Long: COnitnue to stay positive     Interventions Encouraged to attend Cardiac Rehabilitation for the exercise -- Encouraged to attend Cardiac Rehabilitation for the exercise     Continue Psychosocial Services  Follow up required by staff -- Follow up required by staff            Psychosocial Discharge (Final Psychosocial Re-Evaluation):  Psychosocial Re-Evaluation - 07/24/20 1004      Psychosocial Re-Evaluation   Current issues with History of  Depression;Current Psychotropic Meds;Current Stress Concerns;Current Sleep Concerns    Comments Mustaf is doing well.  He is having dificulty sleeping.  His meds knock him out before he is able to get his  CPAP on at night.  He is still very tried but denies any other depression symtoms.  He feels tired most of the day and drugged.    Expected Outcomes SHort: Talk to doc about meds and sleep  Long: COnitnue to stay positive    Interventions Encouraged to attend Cardiac Rehabilitation for the exercise    Continue Psychosocial Services  Follow up required by staff           Vocational Rehabilitation: Provide vocational rehab assistance to qualifying candidates.   Vocational Rehab Evaluation & Intervention:  Vocational Rehab - 04/10/20 1446      Initial Vocational Rehab Evaluation & Intervention   Assessment shows need for Vocational Rehabilitation No   disabled/retired          Education: Education Goals: Education classes will be provided on a variety of topics geared toward better understanding of heart health and risk factor modification. Participant will state understanding/return demonstration of topics presented as noted by education test scores.  Learning Barriers/Preferences:  Learning Barriers/Preferences - 04/10/20 1445      Learning Barriers/Preferences   Learning Barriers Sight   glasss   Learning Preferences None           General Cardiac Education Topics:  AED/CPR: - Group verbal and written instruction with the use of models to demonstrate the basic use of the AED with the basic ABC's of resuscitation.   Anatomy & Physiology of the Heart: - Group verbal and written instruction and models provide basic cardiac anatomy and physiology, with the coronary electrical and arterial systems. Review of Valvular disease and Heart Failure   Cardiac Procedures: - Group verbal and written instruction to review commonly prescribed medications for heart disease. Reviews  the medication, class of the drug, and side effects. Includes the steps to properly store meds and maintain the prescription regimen. (beta blockers and nitrates)   Cardiac Rehab from 08/21/2020 in [facility] Cardiac and Pulmonary Rehab  Date 07/24/20  Educator SB  Instruction Review Code 1- Verbalizes Understanding      Cardiac Medications I: - Group verbal and written instruction to review commonly prescribed medications for heart disease. Reviews the medication, class of the drug, and side effects. Includes the steps to properly store meds and maintain the prescription regimen.   Cardiac Rehab from 08/21/2020 in [facility] Cardiac and Pulmonary Rehab  Date 06/26/20  Educator SB  Instruction Review Code 1- Verbalizes Understanding      Cardiac Medications II: -Group verbal and written instruction to review commonly prescribed medications for heart disease. Reviews the medication, class of the drug, and side effects. (all other drug classes)   Cardiac Rehab from 08/21/2020 in [facility] Cardiac and Pulmonary Rehab  Date 08/21/20  Educator SB  Instruction Review Code 1- Verbalizes Understanding       Go Sex-Intimacy & Heart Disease, Get SMART - Goal Setting: - Group verbal and written instruction through game format to discuss heart disease and the return to sexual intimacy. Provides group verbal and written material to discuss and apply goal setting through the application of the S.M.A.R.T. Method.   Cardiac Rehab from 08/21/2020 in [facility] Cardiac and Pulmonary Rehab  Date 07/24/20  Educator SB  Instruction Review Code 1- Verbalizes Understanding      Other Matters of the Heart: - Provides group verbal, written materials and models to describe Stable Angina and Peripheral Artery. Includes description of the disease process and treatment options available to the cardiac patient.   Infection Prevention: - Provides  verbal and written material to individual with discussion of infection control  including proper hand washing and proper equipment cleaning during exercise session.   Cardiac Rehab from 08/21/2020 in [facility] Cardiac and Pulmonary Rehab  Date 04/15/20  Educator [facility]  Instruction Review Code 1- Verbalizes Understanding      Falls Prevention: - Provides verbal and written material to individual with discussion of falls prevention and safety.   Cardiac Rehab from 08/21/2020 in [facility] Cardiac and Pulmonary Rehab  Date 04/15/20  Educator [facility]  Instruction Review Code 1- Verbalizes Understanding      Other: -Provides group and verbal instruction on various topics (see comments)   Knowledge Questionnaire Score:  Knowledge Questionnaire Score - 09/04/20 1114      Knowledge Questionnaire Score   Post Score 21/26           Core Components/Risk Factors/Patient Goals at Admission:  Personal Goals and Risk Factors at Admission - 04/15/20 1540      Core Components/Risk Factors/Patient Goals on Admission    Weight Management Yes;Weight Loss;Obesity    Intervention Weight Management: Develop a combined nutrition and exercise program designed to reach desired caloric intake, while maintaining appropriate intake of nutrient and fiber, sodium and fats, and appropriate energy expenditure required for the weight goal.;Weight Management: Provide education and appropriate resources to help participant work on and attain dietary goals.;Weight Management/Obesity: Establish reasonable short term and long term weight goals.;Obesity: Provide education and appropriate resources to help participant work on and attain dietary goals.    Admit Weight 298 lb 8 oz (135.4 kg)    Goal Weight: Short Term 293 lb (132.9 kg)    Goal Weight: Long Term 288 lb (130.6 kg)    Expected Outcomes Short Term: Continue to assess and modify interventions until short term weight is achieved;Long Term: Adherence to nutrition and physical activity/exercise program aimed toward attainment of established weight goal;Weight  Loss: Understanding of general recommendations for a balanced deficit meal plan, which promotes 1-2 lb weight loss per week and includes a negative energy balance of [phone] kcal/d;Understanding recommendations for meals to include 15-35% energy as protein, 25-35% energy from fat, 35-60% energy from carbohydrates, less than $RemoveB'200mg'wtjFeCNb$  of dietary cholesterol, 20-35 gm of total fiber daily;Understanding of distribution of calorie intake throughout the day with the consumption of 4-5 meals/snacks    Diabetes Yes    Intervention Provide education about signs/symptoms and action to take for hypo/hyperglycemia.;Provide education about proper nutrition, including hydration, and aerobic/resistive exercise prescription along with prescribed medications to achieve blood glucose in normal ranges: Fasting glucose 65-99 mg/dL    Expected Outcomes Short Term: Participant verbalizes understanding of the signs/symptoms and immediate care of hyper/hypoglycemia, proper foot care and importance of medication, aerobic/resistive exercise and nutrition plan for blood glucose control.;Long Term: Attainment of HbA1C < 7%.    Heart Failure Yes    Intervention Provide a combined exercise and nutrition program that is supplemented with education, support and counseling about heart failure. Directed toward relieving symptoms such as shortness of breath, decreased exercise tolerance, and extremity edema.    Expected Outcomes Improve functional capacity of life;Short term: Attendance in program 2-3 days a week with increased exercise capacity. Reported lower sodium intake. Reported increased fruit and vegetable intake. Reports medication compliance.;Short term: Daily weights obtained and reported for increase. Utilizing diuretic protocols set by physician.;Long term: Adoption of self-care skills and reduction of barriers for early signs and symptoms recognition and intervention leading to self-care maintenance.    Hypertension Yes  Intervention Provide education on lifestyle modifcations including regular physical activity/exercise, weight management, moderate sodium restriction and increased consumption of fresh fruit, vegetables, and low fat dairy, alcohol moderation, and smoking cessation.;Monitor prescription use compliance.    Expected Outcomes Long Term: Maintenance of blood pressure at goal levels.;Short Term: Continued assessment and intervention until BP is < 140/36mm HG in hypertensive participants. < 130/83mm HG in hypertensive participants with diabetes, heart failure or chronic kidney disease.    Lipids Yes    Intervention Provide education and support for participant on nutrition & aerobic/resistive exercise along with prescribed medications to achieve LDL '70mg'$ , HDL >$Remo'40mg'alVCj$ .    Expected Outcomes Short Term: Participant states understanding of desired cholesterol values and is compliant with medications prescribed. Participant is following exercise prescription and nutrition guidelines.;Long Term: Cholesterol controlled with medications as prescribed, with individualized exercise RX and with personalized nutrition plan. Value goals: LDL < $Rem'70mg'FPiI$ , HDL > 40 mg.           Education:Diabetes - Individual verbal and written instruction to review signs/symptoms of diabetes, desired ranges of glucose level fasting, after meals and with exercise. Acknowledge that pre and post exercise glucose checks will be done for 3 sessions at entry of program.   Cardiac Rehab from 08/21/2020 in [facility] Cardiac and Pulmonary Rehab  Date 04/15/20  Educator [facility]  Instruction Review Code 1- Verbalizes Understanding      Education: Know Your Numbers and Risk Factors: -Group verbal and written instruction about important numbers in your health.  Discussion of what are risk factors and how they play a role in the disease process.  Review of Cholesterol, Blood Pressure, Diabetes, and BMI and the role they play in your overall health.   Cardiac  Rehab from 08/21/2020 in [facility] Cardiac and Pulmonary Rehab  Date 08/21/20  Educator SB  Instruction Review Code 1- Verbalizes Understanding      Core Components/Risk Factors/Patient Goals Review:   Goals and Risk Factor Review    [place] Name 06/05/20 1010 07/03/20 0956 07/24/20 1006 08/14/20 0956       Core Components/Risk Factors/Patient Goals Review   Personal Goals Review Weight Management/Obesity;Diabetes;Lipids;Hypertension;Heart Failure Weight Management/Obesity;Diabetes;Lipids;Hypertension;Heart Failure Weight Management/Obesity;Diabetes;Lipids;Hypertension;Heart Failure Weight Management/Obesity;Diabetes;Lipids;Hypertension;Heart Failure    Review Stanislaw has lost weight since the start of the program. He is working on losing more weight and controlling his sugar. His sugar was low a few times he was in rehab and has since been getting it more under control. He is checking his glucose at home twice a day. Shaune is down to 276 lb from 301 lb.  He wants to start exercising at his apartment complex.  His foot is feeling better, but he is still seeing Dr about circulation in his legs.  His BG has been running a little high but Leroy has been off his diet.  BG normally runs about 125. Billye is planning to talk to his doctor about his meds and they make him tired and drugged out during day.  He is so tired after taking them, that he forgets to put on his CPAP.  Blood sugars have been doing a little better, but this week he has been cheating on his diet and it shows up.  He needs to get back to it.  Blood pressures have been doing well.  He denies heart failure symptoms.   His weight has started to creep back up again. Ashyr is doing well in rehab. He had a renal appointment last week and kidney function in improving.  He had a water blister that popped on his leg but he is keeping it covered.  His weight is down almost 16 lb since starting rehab.  Blood sugars have been running a little highter as  he is not eating as well.  His is out of strips until able to refill on Monday.  He has some swelling in his legs, but no other heart failure symptoms.  He says he is feeling better since his stents were placed.    Expected Outcomes Short: lose weight and improve blood sugar levels. long: maintain weight loss and blood sugars independently Short:  continue to work on controlling BG and weight loss Long: manage risk factors on his own Short; Get back to diet routine Long: Continue to monitor risk factors. Short; Get back to eating better and watch sugars closely Long: COntinue to monitor risk factors.           Core Components/Risk Factors/Patient Goals at Discharge (Final Review):   Goals and Risk Factor Review - 08/14/20 0956      Core Components/Risk Factors/Patient Goals Review   Personal Goals Review Weight Management/Obesity;Diabetes;Lipids;Hypertension;Heart Failure    Review Gustav is doing well in rehab. He had a renal appointment last week and kidney function in improving.  He had a water blister that popped on his leg but he is keeping it covered.  His weight is down almost 16 lb since starting rehab.  Blood sugars have been running a little highter as he is not eating as well.  His is out of strips until able to refill on Monday.  He has some swelling in his legs, but no other heart failure symptoms.  He says he is feeling better since his stents were placed.    Expected Outcomes Short; Get back to eating better and watch sugars closely Long: COntinue to monitor risk factors.           ITP Comments:  ITP Comments    Row Name 04/10/20 1504 04/15/20 1535 04/16/20 0846 04/22/20 1018 04/29/20 1521   ITP Comments Completed virtual orientation today.  EP evaluation is scheduled for Tuesday 5/18 at 1pm.  Documentation for diagnosis can be found in [facility] encounter 03/31/20. Completed 6MWT and gym orientation.  Initial ITP created and sent for review to Dr. Emily Filbert, Medical Director. 30 Day  review completed. Medical Director review done, changes made as directed,and approval shown by signature of Medical Director. : First full day of exercise!  Patient was oriented to gym and equipment including functions, settings, policies, and procedures.  Patient's individual exercise prescription and treatment plan were reviewed.  All starting workloads were established based on the results of the 6 minute walk test done at initial orientation visit.  The plan for exercise progression was also introduced and progression will be customized based on patient's performance and goals. Dominyck is having a cath/stent procedure done today and will be out the rest of the week.   [place] Name 05/13/20 1339 05/14/20 0615 05/26/20 1221 06/11/20 1037 07/09/20 0627   ITP Comments Avry has not attended since last review. 30 Day review completed. Medical Director ITP review done, changes made as directed, and signed approval by Medical Director. Called daughter, Sharl Ma,  to check up on pt.  He was out with gout and swelling in his foot.  The gout has caused blisters and more swelling.  They are supposed to see doctor today about his foot and possible wound care.  He was finally able to  get a shoe on this weekend. Sharl Ma will let us know what the doctor says and his plans going forward.  Also, let them know about the schedule change coming up starting next week. 30 Day review completed. Medical Director ITP review done, changes made as directed, and signed approval by Medical Director. 30 Day review completed. Medical Director ITP review done, changes made as directed, and signed approval by Medical Director.   [place] Name 08/06/20 1612 09/03/20 0601 09/04/20 1020       ITP Comments 30 day review completed. ITP sent to Dr. Emily Filbert, Medical Director of Cardiac and Pulmonary Rehab. Continue with ITP unless changes are made by physician. 30 Day review completed. Medical Director ITP review done, changes made as directed, and  signed approval by Medical Director. Harm graduated today from  rehab with 36 sessions completed.  Details of the patient's exercise prescription and what He needs to do in order to continue the prescription and progress were discussed with patient.  Patient was given a copy of prescription and goals.  Patient verbalized understanding.  Ivan plans to continue to exercise by walking at home.            Comments: Discharge ITP

## 2020-09-15 ENCOUNTER — Ambulatory Visit: Payer: Medicare PPO | Admitting: Podiatry

## 2020-09-15 ENCOUNTER — Other Ambulatory Visit: Payer: Self-pay

## 2020-09-15 DIAGNOSIS — E1149 Type 2 diabetes mellitus with other diabetic neurological complication: Secondary | ICD-10-CM

## 2020-09-15 DIAGNOSIS — I739 Peripheral vascular disease, unspecified: Secondary | ICD-10-CM | POA: Diagnosis not present

## 2020-09-15 DIAGNOSIS — L97329 Non-pressure chronic ulcer of left ankle with unspecified severity: Secondary | ICD-10-CM

## 2020-09-15 DIAGNOSIS — I83023 Varicose veins of left lower extremity with ulcer of ankle: Secondary | ICD-10-CM

## 2020-09-15 DIAGNOSIS — L97511 Non-pressure chronic ulcer of other part of right foot limited to breakdown of skin: Secondary | ICD-10-CM

## 2020-09-15 DIAGNOSIS — L97521 Non-pressure chronic ulcer of other part of left foot limited to breakdown of skin: Secondary | ICD-10-CM

## 2020-09-16 NOTE — Progress Notes (Signed)
Subjective: 69 year old male presents the office today with his wife for evaluation of wounds to his feet.  He presents today with his wife and states the wounds are doing much better they appear to be healed.  No new ulcerations identified.  Still swelling to his legs and his Lasix has been increased recently.  He has an upcoming appointment with vascular surgery scheduled for next month.  Currently denies any fevers, chills, nausea, vomiting.  No calf pain, chest pain, shortness of breath.  Objective: AAO x3, NAD DP/PT pulses decreased bilaterally Chronic swelling noted bilaterally with venous insufficiency. Ulceration present on the left calf appears to be healed.  There is a scab along the area where there is no drainage or pus and there is no surrounding erythema.  There is no fluctuance or crepitation.  The wounds on the toes have healed.  There is no open lesions identified bilaterally.  Chronic edema present bilaterally.  There is no pain with calf compression, erythema or warmth.  Assessment: Resolved ulcerations  plan: -All treatment options discussed with the patient including all alternatives, risks, complications.  -Wounds appear to be healed.  Compression bandage applied to the left to help with swelling to help prevent the wound from reopening.  Encouraged elevation.  So would recommend follow-up with vascular surgery as scheduled.  Monitor for any reoccurrence of skin breakdown.  Vivi Barrack DPM

## 2020-09-24 DIAGNOSIS — G4733 Obstructive sleep apnea (adult) (pediatric): Secondary | ICD-10-CM | POA: Diagnosis not present

## 2020-09-30 ENCOUNTER — Other Ambulatory Visit: Payer: Self-pay

## 2020-09-30 DIAGNOSIS — R609 Edema, unspecified: Secondary | ICD-10-CM | POA: Diagnosis not present

## 2020-09-30 DIAGNOSIS — E785 Hyperlipidemia, unspecified: Secondary | ICD-10-CM | POA: Diagnosis not present

## 2020-09-30 DIAGNOSIS — I2510 Atherosclerotic heart disease of native coronary artery without angina pectoris: Secondary | ICD-10-CM | POA: Diagnosis not present

## 2020-09-30 DIAGNOSIS — I739 Peripheral vascular disease, unspecified: Secondary | ICD-10-CM

## 2020-09-30 DIAGNOSIS — G4739 Other sleep apnea: Secondary | ICD-10-CM | POA: Diagnosis not present

## 2020-09-30 DIAGNOSIS — I10 Essential (primary) hypertension: Secondary | ICD-10-CM | POA: Diagnosis not present

## 2020-09-30 DIAGNOSIS — I509 Heart failure, unspecified: Secondary | ICD-10-CM | POA: Diagnosis not present

## 2020-09-30 DIAGNOSIS — Z9861 Coronary angioplasty status: Secondary | ICD-10-CM | POA: Diagnosis not present

## 2020-10-07 ENCOUNTER — Other Ambulatory Visit: Payer: Self-pay

## 2020-10-07 ENCOUNTER — Inpatient Hospital Stay (HOSPITAL_COMMUNITY)
Admission: EM | Admit: 2020-10-07 | Discharge: 2020-10-16 | DRG: 682 | Disposition: A | Discharge status: Skilled Nursing Facility | Payer: Medicare PPO | Attending: Internal Medicine | Admitting: Internal Medicine

## 2020-10-07 ENCOUNTER — Emergency Department (HOSPITAL_COMMUNITY): Payer: Medicare PPO

## 2020-10-07 ENCOUNTER — Inpatient Hospital Stay (HOSPITAL_COMMUNITY): Payer: Medicare PPO

## 2020-10-07 DIAGNOSIS — R404 Transient alteration of awareness: Secondary | ICD-10-CM | POA: Diagnosis not present

## 2020-10-07 DIAGNOSIS — R262 Difficulty in walking, not elsewhere classified: Secondary | ICD-10-CM | POA: Diagnosis not present

## 2020-10-07 DIAGNOSIS — I5032 Chronic diastolic (congestive) heart failure: Secondary | ICD-10-CM | POA: Diagnosis not present

## 2020-10-07 DIAGNOSIS — G9341 Metabolic encephalopathy: Secondary | ICD-10-CM | POA: Diagnosis not present

## 2020-10-07 DIAGNOSIS — Z79899 Other long term (current) drug therapy: Secondary | ICD-10-CM | POA: Diagnosis not present

## 2020-10-07 DIAGNOSIS — R29818 Other symptoms and signs involving the nervous system: Secondary | ICD-10-CM | POA: Diagnosis not present

## 2020-10-07 DIAGNOSIS — I252 Old myocardial infarction: Secondary | ICD-10-CM | POA: Diagnosis not present

## 2020-10-07 DIAGNOSIS — I480 Paroxysmal atrial fibrillation: Secondary | ICD-10-CM | POA: Diagnosis not present

## 2020-10-07 DIAGNOSIS — R4781 Slurred speech: Secondary | ICD-10-CM | POA: Diagnosis not present

## 2020-10-07 DIAGNOSIS — Z20822 Contact with and (suspected) exposure to covid-19: Secondary | ICD-10-CM | POA: Diagnosis not present

## 2020-10-07 DIAGNOSIS — N1831 Chronic kidney disease, stage 3a: Secondary | ICD-10-CM | POA: Diagnosis present

## 2020-10-07 DIAGNOSIS — Z833 Family history of diabetes mellitus: Secondary | ICD-10-CM

## 2020-10-07 DIAGNOSIS — R4789 Other speech disturbances: Secondary | ICD-10-CM | POA: Diagnosis not present

## 2020-10-07 DIAGNOSIS — E1165 Type 2 diabetes mellitus with hyperglycemia: Secondary | ICD-10-CM | POA: Diagnosis not present

## 2020-10-07 DIAGNOSIS — F319 Bipolar disorder, unspecified: Secondary | ICD-10-CM | POA: Diagnosis present

## 2020-10-07 DIAGNOSIS — I130 Hypertensive heart and chronic kidney disease with heart failure and stage 1 through stage 4 chronic kidney disease, or unspecified chronic kidney disease: Secondary | ICD-10-CM | POA: Diagnosis present

## 2020-10-07 DIAGNOSIS — H748X1 Other specified disorders of right middle ear and mastoid: Secondary | ICD-10-CM | POA: Diagnosis not present

## 2020-10-07 DIAGNOSIS — G4733 Obstructive sleep apnea (adult) (pediatric): Secondary | ICD-10-CM | POA: Diagnosis present

## 2020-10-07 DIAGNOSIS — E860 Dehydration: Secondary | ICD-10-CM | POA: Diagnosis present

## 2020-10-07 DIAGNOSIS — N179 Acute kidney failure, unspecified: Principal | ICD-10-CM | POA: Diagnosis present

## 2020-10-07 DIAGNOSIS — E1122 Type 2 diabetes mellitus with diabetic chronic kidney disease: Secondary | ICD-10-CM | POA: Diagnosis not present

## 2020-10-07 DIAGNOSIS — Z794 Long term (current) use of insulin: Secondary | ICD-10-CM

## 2020-10-07 DIAGNOSIS — R41841 Cognitive communication deficit: Secondary | ICD-10-CM | POA: Diagnosis not present

## 2020-10-07 DIAGNOSIS — I6601 Occlusion and stenosis of right middle cerebral artery: Secondary | ICD-10-CM | POA: Diagnosis not present

## 2020-10-07 DIAGNOSIS — I6350 Cerebral infarction due to unspecified occlusion or stenosis of unspecified cerebral artery: Secondary | ICD-10-CM | POA: Diagnosis not present

## 2020-10-07 DIAGNOSIS — L89312 Pressure ulcer of right buttock, stage 2: Secondary | ICD-10-CM | POA: Diagnosis present

## 2020-10-07 DIAGNOSIS — Z7901 Long term (current) use of anticoagulants: Secondary | ICD-10-CM | POA: Diagnosis not present

## 2020-10-07 DIAGNOSIS — R4701 Aphasia: Secondary | ICD-10-CM | POA: Diagnosis not present

## 2020-10-07 DIAGNOSIS — I639 Cerebral infarction, unspecified: Secondary | ICD-10-CM

## 2020-10-07 DIAGNOSIS — Z86711 Personal history of pulmonary embolism: Secondary | ICD-10-CM

## 2020-10-07 DIAGNOSIS — E669 Obesity, unspecified: Secondary | ICD-10-CM | POA: Diagnosis present

## 2020-10-07 DIAGNOSIS — R278 Other lack of coordination: Secondary | ICD-10-CM | POA: Diagnosis present

## 2020-10-07 DIAGNOSIS — Z7401 Bed confinement status: Secondary | ICD-10-CM | POA: Diagnosis not present

## 2020-10-07 DIAGNOSIS — G459 Transient cerebral ischemic attack, unspecified: Secondary | ICD-10-CM | POA: Diagnosis not present

## 2020-10-07 DIAGNOSIS — Z7982 Long term (current) use of aspirin: Secondary | ICD-10-CM | POA: Diagnosis not present

## 2020-10-07 DIAGNOSIS — I6389 Other cerebral infarction: Secondary | ICD-10-CM | POA: Diagnosis not present

## 2020-10-07 DIAGNOSIS — Z6838 Body mass index (BMI) 38.0-38.9, adult: Secondary | ICD-10-CM

## 2020-10-07 DIAGNOSIS — I6501 Occlusion and stenosis of right vertebral artery: Secondary | ICD-10-CM | POA: Diagnosis not present

## 2020-10-07 DIAGNOSIS — Z7902 Long term (current) use of antithrombotics/antiplatelets: Secondary | ICD-10-CM

## 2020-10-07 DIAGNOSIS — E039 Hypothyroidism, unspecified: Secondary | ICD-10-CM | POA: Diagnosis present

## 2020-10-07 DIAGNOSIS — Z9861 Coronary angioplasty status: Secondary | ICD-10-CM

## 2020-10-07 DIAGNOSIS — I672 Cerebral atherosclerosis: Secondary | ICD-10-CM | POA: Diagnosis present

## 2020-10-07 DIAGNOSIS — R2681 Unsteadiness on feet: Secondary | ICD-10-CM | POA: Diagnosis not present

## 2020-10-07 DIAGNOSIS — E785 Hyperlipidemia, unspecified: Secondary | ICD-10-CM | POA: Diagnosis present

## 2020-10-07 DIAGNOSIS — E875 Hyperkalemia: Secondary | ICD-10-CM | POA: Diagnosis present

## 2020-10-07 DIAGNOSIS — Z8249 Family history of ischemic heart disease and other diseases of the circulatory system: Secondary | ICD-10-CM | POA: Diagnosis not present

## 2020-10-07 DIAGNOSIS — I2510 Atherosclerotic heart disease of native coronary artery without angina pectoris: Secondary | ICD-10-CM | POA: Diagnosis not present

## 2020-10-07 DIAGNOSIS — I6523 Occlusion and stenosis of bilateral carotid arteries: Secondary | ICD-10-CM | POA: Diagnosis not present

## 2020-10-07 DIAGNOSIS — R4182 Altered mental status, unspecified: Secondary | ICD-10-CM | POA: Diagnosis not present

## 2020-10-07 DIAGNOSIS — R29810 Facial weakness: Secondary | ICD-10-CM | POA: Diagnosis present

## 2020-10-07 DIAGNOSIS — M2550 Pain in unspecified joint: Secondary | ICD-10-CM | POA: Diagnosis not present

## 2020-10-07 DIAGNOSIS — L89322 Pressure ulcer of left buttock, stage 2: Secondary | ICD-10-CM | POA: Diagnosis present

## 2020-10-07 DIAGNOSIS — Z86718 Personal history of other venous thrombosis and embolism: Secondary | ICD-10-CM

## 2020-10-07 DIAGNOSIS — M6281 Muscle weakness (generalized): Secondary | ICD-10-CM | POA: Diagnosis not present

## 2020-10-07 DIAGNOSIS — I10 Essential (primary) hypertension: Secondary | ICD-10-CM | POA: Diagnosis not present

## 2020-10-07 DIAGNOSIS — Z8261 Family history of arthritis: Secondary | ICD-10-CM

## 2020-10-07 DIAGNOSIS — Z888 Allergy status to other drugs, medicaments and biological substances status: Secondary | ICD-10-CM

## 2020-10-07 LAB — CBC
HCT: 42.6 % (ref 39.0–52.0)
Hemoglobin: 12.3 g/dL — ABNORMAL LOW (ref 13.0–17.0)
MCH: 22.5 pg — ABNORMAL LOW (ref 26.0–34.0)
MCHC: 28.9 g/dL — ABNORMAL LOW (ref 30.0–36.0)
MCV: 78 fL — ABNORMAL LOW (ref 80.0–100.0)
Platelets: 217 10*3/uL (ref 150–400)
RBC: 5.46 MIL/uL (ref 4.22–5.81)
RDW: 19.2 % — ABNORMAL HIGH (ref 11.5–15.5)
WBC: 5.9 10*3/uL (ref 4.0–10.5)
nRBC: 0 % (ref 0.0–0.2)

## 2020-10-07 LAB — RESPIRATORY PANEL BY RT PCR (FLU A&B, COVID)
Influenza A by PCR: NEGATIVE
Influenza B by PCR: NEGATIVE
SARS Coronavirus 2 by RT PCR: NEGATIVE

## 2020-10-07 LAB — URINALYSIS, ROUTINE W REFLEX MICROSCOPIC
Bacteria, UA: NONE SEEN
Bilirubin Urine: NEGATIVE
Glucose, UA: >=500 mg/dL — AB
Hgb urine dipstick: NEGATIVE
Ketones, ur: NEGATIVE mg/dL
Leukocytes,Ua: NEGATIVE
Nitrite: NEGATIVE
Protein, ur: NEGATIVE mg/dL
Specific Gravity, Urine: 1.01 (ref 1.005–1.030)
pH: 5 (ref 5.0–8.0)

## 2020-10-07 LAB — COMPREHENSIVE METABOLIC PANEL
ALT: 13 U/L (ref 0–44)
AST: 14 U/L — ABNORMAL LOW (ref 15–41)
Albumin: 3.5 g/dL (ref 3.5–5.0)
Alkaline Phosphatase: 52 U/L (ref 38–126)
Anion gap: 10 (ref 5–15)
BUN: 63 mg/dL — ABNORMAL HIGH (ref 8–23)
CO2: 26 mmol/L (ref 22–32)
Calcium: 10 mg/dL (ref 8.9–10.3)
Chloride: 99 mmol/L (ref 98–111)
Creatinine, Ser: 2.31 mg/dL — ABNORMAL HIGH (ref 0.61–1.24)
GFR, Estimated: 30 mL/min — ABNORMAL LOW (ref >60)
Glucose, Bld: 228 mg/dL — ABNORMAL HIGH (ref 70–99)
Potassium: 5.2 mmol/L — ABNORMAL HIGH (ref 3.5–5.1)
Sodium: 135 mmol/L (ref 135–145)
Total Bilirubin: 0.7 mg/dL (ref 0.3–1.2)
Total Protein: 7.5 g/dL (ref 6.5–8.1)

## 2020-10-07 LAB — DIFFERENTIAL
Abs Immature Granulocytes: 0.02 10*3/uL (ref 0.00–0.07)
Basophils Absolute: 0 10*3/uL (ref 0.0–0.1)
Basophils Relative: 0 %
Eosinophils Absolute: 0 10*3/uL (ref 0.0–0.5)
Eosinophils Relative: 1 %
Immature Granulocytes: 0 %
Lymphocytes Relative: 14 %
Lymphs Abs: 0.8 10*3/uL (ref 0.7–4.0)
Monocytes Absolute: 0.5 10*3/uL (ref 0.1–1.0)
Monocytes Relative: 8 %
Neutro Abs: 4.6 10*3/uL (ref 1.7–7.7)
Neutrophils Relative %: 77 %

## 2020-10-07 LAB — PROTIME-INR
INR: 1.9 — ABNORMAL HIGH (ref 0.8–1.2)
Prothrombin Time: 21 seconds — ABNORMAL HIGH (ref 11.4–15.2)

## 2020-10-07 LAB — RAPID URINE DRUG SCREEN, HOSP PERFORMED
Amphetamines: NOT DETECTED
Barbiturates: NOT DETECTED
Benzodiazepines: NOT DETECTED
Cocaine: NOT DETECTED
Opiates: NOT DETECTED
Tetrahydrocannabinol: NOT DETECTED

## 2020-10-07 LAB — TROPONIN I (HIGH SENSITIVITY)
Troponin I (High Sensitivity): 24 ng/L — ABNORMAL HIGH (ref <18)
Troponin I (High Sensitivity): 27 ng/L — ABNORMAL HIGH (ref <18)

## 2020-10-07 LAB — CK: Total CK: 98 U/L (ref 49–397)

## 2020-10-07 LAB — GLUCOSE, CAPILLARY: Glucose-Capillary: 214 mg/dL — ABNORMAL HIGH (ref 70–99)

## 2020-10-07 LAB — ETHANOL: Alcohol, Ethyl (B): <10 mg/dL (ref <10)

## 2020-10-07 LAB — VALPROIC ACID LEVEL: Valproic Acid Lvl: 24 ug/mL — ABNORMAL LOW (ref 50.0–100.0)

## 2020-10-07 LAB — APTT: aPTT: 33 seconds (ref 24–36)

## 2020-10-07 LAB — TSH: TSH: 1.021 u[IU]/mL (ref 0.350–4.500)

## 2020-10-07 MED ORDER — INSULIN ASPART 100 UNIT/ML ~~LOC~~ SOLN
0.0000 [IU] | Freq: Every day | SUBCUTANEOUS | Status: DC
  Administered 2020-10-07 – 2020-10-15 (×4): 2 [IU] via SUBCUTANEOUS

## 2020-10-07 MED ORDER — METOPROLOL SUCCINATE ER 50 MG PO TB24
50.0000 mg | ORAL_TABLET | Freq: Every day | ORAL | Status: DC
  Administered 2020-10-08 – 2020-10-11 (×4): 50 mg via ORAL
  Filled 2020-10-07 (×4): qty 1

## 2020-10-07 MED ORDER — ACETAMINOPHEN 160 MG/5ML PO SOLN: 650.0000 mg | ORAL | Status: DC | PRN

## 2020-10-07 MED ORDER — INSULIN ASPART 100 UNIT/ML ~~LOC~~ SOLN
0.0000 [IU] | Freq: Three times a day (TID) | SUBCUTANEOUS | Status: DC
  Administered 2020-10-08 (×2): 2 [IU] via SUBCUTANEOUS
  Administered 2020-10-09: 3 [IU] via SUBCUTANEOUS
  Administered 2020-10-10 – 2020-10-12 (×5): 2 [IU] via SUBCUTANEOUS
  Administered 2020-10-12: 3 [IU] via SUBCUTANEOUS
  Administered 2020-10-13 (×3): 2 [IU] via SUBCUTANEOUS
  Administered 2020-10-14 (×3): 3 [IU] via SUBCUTANEOUS
  Administered 2020-10-15: 2 [IU] via SUBCUTANEOUS
  Administered 2020-10-15 (×2): 3 [IU] via SUBCUTANEOUS
  Administered 2020-10-16: 2 [IU] via SUBCUTANEOUS
  Administered 2020-10-16: 5 [IU] via SUBCUTANEOUS
  Administered 2020-10-16: 3 [IU] via SUBCUTANEOUS

## 2020-10-07 MED ORDER — ACETAMINOPHEN 500 MG PO TABS: 500.0000 mg | ORAL_TABLET | Freq: Three times a day (TID) | ORAL | Status: DC | PRN

## 2020-10-07 MED ORDER — ARIPIPRAZOLE 10 MG PO TABS
20.0000 mg | ORAL_TABLET | Freq: Every day | ORAL | Status: DC
  Administered 2020-10-08 – 2020-10-16 (×9): 20 mg via ORAL
  Filled 2020-10-07 (×9): qty 2

## 2020-10-07 MED ORDER — BUPROPION HCL 75 MG PO TABS
75.0000 mg | ORAL_TABLET | Freq: Every day | ORAL | Status: DC
  Administered 2020-10-08 – 2020-10-16 (×9): 75 mg via ORAL
  Filled 2020-10-07 (×9): qty 1

## 2020-10-07 MED ORDER — STROKE: EARLY STAGES OF RECOVERY BOOK: Freq: Once | Status: AC

## 2020-10-07 MED ORDER — SENNOSIDES-DOCUSATE SODIUM 8.6-50 MG PO TABS
1.0000 | ORAL_TABLET | Freq: Every evening | ORAL | Status: DC | PRN
  Administered 2020-10-14 – 2020-10-15 (×2): 1 via ORAL
  Filled 2020-10-07 (×2): qty 1

## 2020-10-07 MED ORDER — MIRTAZAPINE 15 MG PO TBDP
15.0000 mg | ORAL_TABLET | Freq: Every day | ORAL | Status: DC
  Administered 2020-10-07 – 2020-10-15 (×9): 15 mg via ORAL
  Filled 2020-10-07 (×11): qty 1

## 2020-10-07 MED ORDER — LIOTHYRONINE SODIUM 25 MCG PO TABS
25.0000 ug | ORAL_TABLET | Freq: Every day | ORAL | Status: DC
  Administered 2020-10-08 – 2020-10-16 (×9): 25 ug via ORAL
  Filled 2020-10-07 (×11): qty 1

## 2020-10-07 MED ORDER — QUETIAPINE FUMARATE 25 MG PO TABS
25.0000 mg | ORAL_TABLET | Freq: Two times a day (BID) | ORAL | Status: DC
  Administered 2020-10-08 – 2020-10-16 (×18): 25 mg via ORAL
  Filled 2020-10-07 (×18): qty 1

## 2020-10-07 MED ORDER — HEPARIN SODIUM (PORCINE) 5000 UNIT/ML IJ SOLN
5000.0000 [IU] | Freq: Two times a day (BID) | INTRAMUSCULAR | Status: DC
  Administered 2020-10-07: 5000 [IU] via SUBCUTANEOUS
  Filled 2020-10-07: qty 1

## 2020-10-07 MED ORDER — DIVALPROEX SODIUM 250 MG PO DR TAB
500.0000 mg | DELAYED_RELEASE_TABLET | Freq: Two times a day (BID) | ORAL | Status: DC
  Administered 2020-10-07 – 2020-10-16 (×18): 500 mg via ORAL
  Filled 2020-10-07 (×18): qty 2

## 2020-10-07 MED ORDER — SODIUM CHLORIDE 0.9 % IV SOLN: INTRAVENOUS | Status: AC

## 2020-10-07 MED ORDER — ACETAMINOPHEN 325 MG PO TABS
650.0000 mg | ORAL_TABLET | ORAL | Status: DC | PRN
  Administered 2020-10-11: 650 mg via ORAL
  Filled 2020-10-07: qty 2

## 2020-10-07 MED ORDER — ACETAMINOPHEN 650 MG RE SUPP: 650.0000 mg | RECTAL | Status: DC | PRN

## 2020-10-07 MED ORDER — CLOPIDOGREL BISULFATE 75 MG PO TABS
75.0000 mg | ORAL_TABLET | Freq: Every day | ORAL | Status: DC
  Administered 2020-10-08 – 2020-10-09 (×2): 75 mg via ORAL
  Filled 2020-10-07 (×2): qty 1

## 2020-10-07 MED ORDER — FUROSEMIDE 20 MG PO TABS: 40.0000 mg | ORAL_TABLET | Freq: Every morning | ORAL | Status: DC

## 2020-10-07 MED ORDER — RIVAROXABAN 20 MG PO TABS: 20.0000 mg | ORAL_TABLET | Freq: Every day | ORAL | Status: DC

## 2020-10-07 MED ORDER — QUETIAPINE FUMARATE 200 MG PO TABS
300.0000 mg | ORAL_TABLET | Freq: Every day | ORAL | Status: DC
  Administered 2020-10-07 – 2020-10-15 (×9): 300 mg via ORAL
  Filled 2020-10-07 (×10): qty 1

## 2020-10-07 MED ORDER — SODIUM CHLORIDE 0.9 % IV BOLUS
1000.0000 mL | Freq: Once | INTRAVENOUS | Status: AC
  Administered 2020-10-07: 1000 mL via INTRAVENOUS

## 2020-10-07 MED ORDER — HEPARIN SODIUM (PORCINE) 5000 UNIT/ML IJ SOLN: 5000.0000 [IU] | Freq: Two times a day (BID) | INTRAMUSCULAR | Status: DC

## 2020-10-07 MED ORDER — HYDRALAZINE HCL 25 MG PO TABS: 25.0000 mg | ORAL_TABLET | Freq: Four times a day (QID) | ORAL | Status: DC | PRN

## 2020-10-07 MED ORDER — SODIUM ZIRCONIUM CYCLOSILICATE 10 G PO PACK
10.0000 g | PACK | Freq: Once | ORAL | Status: AC
  Administered 2020-10-07: 10 g via ORAL
  Filled 2020-10-07: qty 1

## 2020-10-07 MED ORDER — ATORVASTATIN CALCIUM 10 MG PO TABS: 10.0000 mg | ORAL_TABLET | Freq: Every day | ORAL | Status: DC

## 2020-10-07 NOTE — Consult Note (Addendum)
NEUROLOGY CONSULTATION NOTE   Date of service: October 07, 2020 Patient Name: Christian [name] MRN:  [id number] DOB:  1951-01-02 Reason for consult: "aphasia"  History of Present Illness  Christian [name] is a 69 y.o. male with PMH significant for BL lower extremity edema, Bipolar 1 disorder, CKD, gout, HTN, HLD, DM2, atrial flutter diagnosed in feb 2021 and on Xarelto, PCI with 2 stents in June 2021 and started on plavix who presents with aphasia.  Patient unable to provide any history due to significant aphasia. I spoke to daughter over the phone who reported that saturday evening, patient reported to his wife that he was having trouble with formulating sentences, she did not think much of it as he was able to communicate fine over the phone. Sunday when his daughter went in, she found the patient continued to have trouble speech and patient kept repeating the sentence " I cannot formulate sentences".  This worsened on Monday and he was unable to understand simple sentences and then this continued to be a problem on Tuesday. Family initially thought that this maybe due to his mental health issues but this was very different than what it has presented as in the past. Daughter checked patient's glucose was in the 300. She called PCP who set up appointment on Tuesday morning.  Patient arrived in PCPs office in the morning, and reportedly was found to have left-sided facial droop and right-sided weakness and sent to the ED."  Workup with CTH negative for a large territory infarct. MRI Brain with no acute intracranial abnormlity, mild chronic small vessel disease. 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 Severe stenosis of both ICA siphons with mild to moderate R ACA origin stenosis. 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  LKW: 10/04/20 at  1200. NIHSS: 5 for not answering questions, follows 1 command, severe aphasia. MRS: 2 (due to mental issues) tPA: not a candidate, LKW is more than 24 hours ago. Thrombectomy: Not a candidate, he was out of window.   ROS  Unable to obtain a detailed ROS due to aphasia. Past History   Past Medical History:  Diagnosis Date  . Bilateral lower extremity edema: chronic with venous stasis changes 06/02/2014  . Bipolar 1 disorder (HCC)   . Bipolar disorder (HCC)   . Chronic kidney disease   . Gout   . Gout   . Hx of blood clots   . Hypertension   . Mood swings   . OSA on CPAP 06/02/2014  . Other and unspecified hyperlipidemia 06/02/2014  . Type II or unspecified type diabetes mellitus with unspecified complication, uncontrolled 06/02/2014   Past Surgical History:  Procedure Laterality Date  . LEFT HEART CATH AND CORONARY ANGIOGRAPHY Left 04/01/2020   Procedure: LEFT HEART CATH AND CORONARY ANGIOGRAPHY;  Surgeon: Laurier Nancy, MD;  Location: [facility] INVASIVE CV LAB;  Service: Cardiovascular;  Laterality: Left;   Family History  Problem Relation Age of Onset  . Dementia Mother   . Arthritis Father   . Hypertension Father   . Mental illness Sister   . Diabetes Sister   . Heart failure Neg Hx   . Kidney failure Neg Hx   . Cancer Neg Hx    Social History   Socioeconomic History  . Marital status: Divorced    Spouse name: Not on file  .  Number of children: Not on file  . Years of education: Not on file  . Highest education level: Not on file  Occupational History  . Not on file  Tobacco Use  . Smoking status: Never Smoker  . Smokeless tobacco: Never Used  Substance and Sexual Activity  . Alcohol use: No  . Drug use: No  . Sexual activity: Not Currently  Other Topics Concern  . Not on file  Social History Narrative   *The patient is divorced and is currently on disability. He lives alone and has one daughter.             Social Determinants of Health   Financial Resource  Strain:   . Difficulty of Paying Living Expenses: Not on file  Food Insecurity:   . Worried About Programme researcher, broadcasting/film/video in the Last Year: Not on file  . Ran Out of Food in the Last Year: Not on file  Transportation Needs:   . Lack of Transportation (Medical): Not on file  . Lack of Transportation (Non-Medical): Not on file  Physical Activity:   . Days of Exercise per Week: Not on file  . Minutes of Exercise per Session: Not on file  Stress:   . Feeling of Stress : Not on file  Social Connections:   . Frequency of Communication with Friends and Family: Not on file  . Frequency of Social Gatherings with Friends and Family: Not on file  . Attends Religious Services: Not on file  . Active Member of Clubs or Organizations: Not on file  . Attends Banker Meetings: Not on file  . Marital Status: Not on file   Allergies  Allergen Reactions  . Amlodipine Swelling and Other (See Comments)    Legs became swollen  . Onglyza [Saxagliptin] Other (See Comments)    "Allergic," per [facility]  . Zyprexa [Olanzapine] Other (See Comments)    Allergy noted by ACT Team. No reaction specified (??)- "Allergic," per [facility]    Medications   Medications Prior to Admission  Medication Sig Dispense Refill Last Dose  . acetaminophen (TYLENOL) 500 MG tablet Take 500-1,000 mg by mouth every 8 (eight) hours as needed for mild pain or headache.   unk  . amLODipine (NORVASC) 10 MG tablet Take 1 tablet (10 mg total) by mouth daily. 30 tablet 0 10/07/2020 at Unknown time  . ARIPiprazole (ABILIFY) 20 MG tablet Take 1 tablet (20 mg total) by mouth daily. 30 tablet 0 10/07/2020 at Unknown time  . atorvastatin (LIPITOR) 10 MG tablet Take 1 tablet (10 mg total) by mouth daily at 6 PM. (Patient taking differently: Take 10 mg by mouth daily. ) 30 tablet 0 10/07/2020 at Unknown time  . buPROPion (WELLBUTRIN) 75 MG tablet Take 1 tablet (75 mg total) by mouth daily. 30 tablet 0 10/07/2020 at  Unknown time  . clopidogrel (PLAVIX) 75 MG tablet Take 75 mg by mouth daily.    10/07/2020 at 0900  . divalproex (DEPAKOTE) 500 MG DR tablet Take 1 tablet (500 mg total) by mouth every 12 (twelve) hours. 60 tablet 0 10/07/2020 at am  . empagliflozin (JARDIANCE) 10 MG TABS tablet Take 10 mg by mouth daily.    10/07/2020 at Unknown time  . furosemide (LASIX) 40 MG tablet Take 40 mg by mouth in the morning.   10/07/2020 at am  . glimepiride (AMARYL) 2 MG tablet Take 2 mg by mouth daily with breakfast.   10/07/2020 at am  .  insulin glargine (LANTUS SOLOSTAR) 100 UNIT/ML Solostar Pen Inject 6-7 Units into the skin See admin instructions. Inject 6 units into the skin in the morning and increase to 7 units if BGL is 150 or greater   10/07/2020 at am  . liothyronine (CYTOMEL) 25 MCG tablet Take 25 mcg by mouth daily before breakfast.    10/07/2020 at am  . lisinopril (ZESTRIL) 40 MG tablet Take 1 tablet (40 mg total) by mouth daily. 30 tablet 0 10/07/2020 at Unknown time  . metoprolol succinate (TOPROL-XL) 50 MG 24 hr tablet Take 1 tablet (50 mg total) by mouth daily. Take with or immediately following a meal. 60 tablet 3 10/07/2020 at 0900  . mirtazapine (REMERON SOL-TAB) 15 MG disintegrating tablet Take 1 tablet (15 mg total) by mouth at bedtime. 30 tablet 0 10/06/2020 at pm  . polyethylene glycol powder (GLYCOLAX/MIRALAX) 17 GM/SCOOP powder Take 17 g by mouth See admin instructions. Dissolve 17 grams of powder into 4-8 ounces of water and drink every one to two days   unk  . QUEtiapine (SEROQUEL) 25 MG tablet Take 25 mg by mouth 2 (two) times daily.    10/07/2020 at Unknown time  . QUEtiapine (SEROQUEL) 300 MG tablet Take 300 mg by mouth at bedtime.   10/06/2020 at pm  . rivaroxaban (XARELTO) 20 MG TABS tablet Take 1 tablet (20 mg total) by mouth daily with supper. (Patient taking differently: Take 20 mg by mouth daily. ) 30 tablet 0 10/07/2020 at 0900  . aspirin EC 81 MG EC tablet Take 1 tablet (81 mg total) by mouth  daily. (Patient not taking: Reported on 10/07/2020) 30 tablet 0 Not Taking at Unknown time  . doxycycline (VIBRA-TABS) 100 MG tablet Take 1 tablet (100 mg total) by mouth 2 (two) times daily. (Patient not taking: Reported on 10/07/2020) 20 tablet 0 Not Taking at Unknown time     Vitals   Vitals:   10/07/20 2030 10/07/20 2100 10/07/20 2115 10/07/20 2130  BP: 135/78 130/87 123/78 123/77  Pulse: 90 88 87 88  Resp: (!) (!) 25  Temp:      TempSrc:      SpO2: 99% 98% 97% 95%  Weight:      Height:         Body mass index is 38.11 kg/m.  Physical Exam   General: Laying comfortably in bed; in no acute distress. HENT: Normal oropharynx and mucosa. Normal external appearance of ears and nose. Neck: Supple, no pain or tenderness CV: No JVD. No peripheral edema. Pulmonary: Symmetric Chest rise. Normal respiratory effort. Abdomen: Soft to touch, non-tender. Ext: No cyanosis, edema, or deformity Skin: No rash. Normal palpation of skin.  Musculoskeletal: Normal digits and nails by inspection. No clubbing.  Neurologic Examination  Mental status/Cognition: Alert, aphasia, unable to state his name or answer any questions. Speech/language: Fluent, severe global aphasia with comprehension intact intermittently to very simple commands. Unable to repeat. Able to express in in 1-2 words. Cranial nerves:   CN II Pupils equal and reactive to light, no VF deficits   CN III,IV,VI EOM intact, no gaze preference or deviation, no nystagmus   CN V normal sensation in V1, V2, and V3 segments bilaterally   CN VII no asymmetry, no nasolabial fold flattening   CN VIII normal hearing to speech   CN IX & X normal palatal elevation, no uvular deviation   CN XI 5/5 head turn and 5/5 shoulder shrug bilaterally   CN XII  midline tongue protrusion   Motor:  Muscle bulk: normal, tone normal, pronator drift none tremor none Unable to do detailed strength testing due to aphasia making it difficult to get him  to do anything. He is able to keep both hands off the bed for more than 10 secs and keep his legs off the bed for more than 5 secs.  Reflexes:  Right Left Comments  Pectoralis      Biceps (C5/6) 1 1   Brachioradialis (C5/6) 1 1    Triceps (C6/7) 1 1    Patellar (L3/4) 1 1    Achilles (S1) 1 1    Hoffman      Plantar     Jaw jerk    Sensation:  Light touch Intact throughout   Pin prick    Temperature    Vibration   Proprioception    Coordination/Complex Motor:  - unable to assess finger to nose or heel to shin due to difficulty with having him follow commands.  Labs   CBC:  Recent Labs  Lab 10/07/20 1534  WBC 5.9  NEUTROABS 4.6  HGB 12.3*  HCT 42.6  MCV 78.0*  PLT 217    Basic Metabolic Panel:  Lab Results  Component Value Date   NA 135 10/07/2020   K 5.2 (H) 10/07/2020   CO2 26 10/07/2020   GLUCOSE 228 (H) 10/07/2020   BUN 63 (H) 10/07/2020   CREATININE 2.31 (H) 10/07/2020   CALCIUM 10.0 10/07/2020   GFRNONAA 30 (L) 10/07/2020   GFRAA 52 (L) 04/01/2020   Lipid Panel:  Lab Results  Component Value Date   LDLCALC 63 12/28/2015   HgbA1c:  Lab Results  Component Value Date   HGBA1C 8.7 (H) 03/31/2020   Urine Drug Screen:     Component Value Date/Time   LABOPIA NONE DETECTED 10/07/2020 1728   COCAINSCRNUR NONE DETECTED 10/07/2020 1728   COCAINSCRNUR NONE DETECTED 01/30/2020 1434   LABBENZ NONE DETECTED 10/07/2020 1728   AMPHETMU NONE DETECTED 10/07/2020 1728   THCU NONE DETECTED 10/07/2020 1728   LABBARB NONE DETECTED 10/07/2020 1728    Alcohol Level     Component Value Date/Time   ETH <10 10/07/2020 1534   MR ANGIO HEAD WO CONTRAST 1. Chronically advanced intracranial atherosclerosis.  2. The following have progressed since a 2012 MRA: - occlusion of the non-dominant distal Right Vertebral Artery. - Moderate to Severe stenosis at the Left Vertebrobasilar Junction. - Severe stenosis of both ICA siphons (right anterior genu and  left supraclinoid segment). - mild to moderate Right ACA origin stenosis.  3. The following are stable since 2012: - Moderate Right MCA M1 stenosis. - Moderate to Severe bilateral PCA P2 stenoses.  MRI Brain without contrast: IMPRESSION: 1. No acute intracranial abnormality. 2. Mild for age chronic small vessel disease, not significantly changed since 2012. 3. Evidence of chronically decreased flow in the distal right vertebral artery. See also MRA today reported separately.  Impression   Christian [name] is a 69 y.o. male with PMH significant for BL lower extremity edema, Bipolar 1 disorder, CKD, gout, HTN, HLD, DM2, atrial flutter diagnosed in feb 2021 and on Xarelto, PCI with 2 stents in June 2021 and started on plavix who presents with aphasia.  MRI Brain is negative but with noted multivessel multifocal stenosis, suspect that this could be from that. Recommend permissive HTN, head of bed flat for 24-48 hours, strict bedrest.  Recommendations  Plan:  - Frequent Neuro checks per stroke unit protocol -  I ordered US Carotid doppler - TTE - Recommend obtaining Lipid panel with LDL - I ordered Atorvastatin 80mg  daily. - Recommend HbA1c - Antithrombotic - continue home plavix for recent stents and for noted intracranial atherosclerosis - I ordered home Xarelto 20mg  daily for hx of prior atrial flutter. - SBP goal - permissive hypertension first 24 h < 220/110. Held home meds.  - Recommend Telemetry monitoring for arrythmia - Recommend bedside swallow screen prior to PO intake. - Stroke education booklet - Recommend PT/OT/SLP consult - I ordered head of bed flat, strict bedrest - can be taken off after 24-48 hours - Continue IV fluids at 163ml/hr. ______________________________________________________________________   Thank you for the opportunity to take part in the care of this patient. If you have any further questions, please contact the neurology consultation  attending.  Signed,  Erick Blinks [facility] Pager Number [phone]

## 2020-10-07 NOTE — ED Triage Notes (Signed)
Patient arrived via [facility] from doctor office with stroke symptoms. Per family patient was LKN on Saturday at 11am. And stated he was not feeling well. Daughter was not able to see him until Sunday and he had slurred speech and weakness however she checked the patient sugar and it was in the 300 so she thought his symptoms were related to his DM. Patient made an appointment with doctor office for today and was told to send to ED.  Patient noted with slurred speech and slow to respond and weakness on the right side. EMS started 18g to left AC.  One bed bug was noted on patient and captured.

## 2020-10-07 NOTE — ED Provider Notes (Signed)
  Physical Exam  BP 115/76   Pulse 83   Temp 98.9 F (37.2 C) (Oral)   Resp 14   Ht 6' (1.829 m)   Wt 127.5 kg   SpO2 90%   BMI 38.11 kg/m   Physical Exam  ED Course/Procedures     Procedures  MDM  Care assumed at 3 PM.  Patient is here with trouble speaking and right-sided weakness.  Last normal was 4 days ago.  Signout is pending CT head and labs.  6:15 PM CT head did not show any bleed.  His labs showed mild AKI.  Given IV fluids.  I discussed case with Dr. Thomasena Edis from neurology.  He recommends CTA but since patient's creatinine is 2.3, will get MRA instead.  He recommend admission for stroke work-up and will see patient as consult       Charlynne Pander, MD 10/07/20 1816

## 2020-10-07 NOTE — ED Provider Notes (Signed)
[facility] EMERGENCY DEPARTMENT Provider Note   CSN: [id number] Arrival date & time: 10/07/20  1423     History Chief Complaint  Patient presents with  . Stroke Symptoms    Christian [name] is a 69 y.o. male.  Pt presents to the ED today with slowed speech.  Pt was LSN on Saturday, 11/6.  Pt is a poor historian.  Information obtained by EMS.  Pt apparently called his ex-wife on 11/6 evening.  He was noted to have some slurred speech.  He lives alone, but his daughter checks on him frequently.  She checked on him Sunday morning.  His BS was elevated, so she thought that was why he was not acting right.  Pt still not acting right yesterday, so they called his pcp to make an appt which was today.  When he arrived at the pcp's office, they saw him and called EMS.          Past Medical History:  Diagnosis Date  . Bilateral lower extremity edema: chronic with venous stasis changes 06/02/2014  . Bipolar 1 disorder (HCC)   . Bipolar disorder (HCC)   . Chronic kidney disease   . Gout   . Gout   . Hx of blood clots   . Hypertension   . Mood swings   . OSA on CPAP 06/02/2014  . Other and unspecified hyperlipidemia 06/02/2014  . Type II or unspecified type diabetes mellitus with unspecified complication, uncontrolled 06/02/2014    Patient Active Problem List   Diagnosis Date Noted  . Type 2 diabetes mellitus with stage 3 chronic kidney disease (HCC) 04/01/2020  . CAD (coronary artery disease), native coronary artery 04/01/2020  . NSTEMI (non-ST elevated myocardial infarction) (HCC) 03/31/2020  . Obesity, Class III, BMI 40-49.9 (morbid obesity) (HCC) 03/31/2020  . Aspiration pneumonia of both lower lobes due to gastric secretions (HCC) 03/21/2020  . Sacral decubitus ulcer, stage II (HCC) 03/19/2020  . Chronic diastolic CHF (congestive heart failure) (HCC) 03/17/2020  . Atrial flutter, paroxysmal (HCC) 12/27/2019  . Constipation 09/16/2018  . Bipolar 1 disorder,  depressed (HCC) 08/22/2018  . Encephalopathy   . Paroxysmal atrial fibrillation (HCC) 06/18/2018  . History of pulmonary embolism 06/17/2018  . Generalized weakness 03/27/2018  . Diabetes mellitus type 2 in nonobese (HCC) 03/27/2018  . Pulmonary embolism (HCC) 03/27/2018  . Fall   . Laceration of eyebrow   . Bipolar I disorder, most recent episode depressed, severe w psychosis (HCC) 12/22/2015  . Bipolar affective disorder, depressed, severe, with psychotic behavior (HCC)   . Bipolar I disorder, current or most recent episode depressed, with psychotic features (HCC)   . Severe bipolar I disorder with depression (HCC)   . Bipolar I disorder, most recent episode depressed, severe with psychotic features (HCC)   . Overdose 09/11/2015  . Acute respiratory failure with hypoxia (HCC) 09/05/2015  . Elevated troponin 09/05/2015  . Somnolence 09/05/2015  . Bipolar depression (HCC) 09/05/2015  . Acute bilateral deep vein thrombosis (DVT) of femoral veins (HCC) 09/05/2015  . Bilateral pulmonary embolism (HCC) 09/02/2015  . Labile hypertension   . Pulmonary embolism with acute cor pulmonale (HCC)   . Dyspnea   . Orthostatic hypotension   . Bipolar disorder, in partial remission, most recent episode depressed (HCC)   . Syncope, near 08/31/2015  . Affective psychosis, bipolar (HCC)   . Bipolar affective disorder, depressed, mild (HCC) 08/28/2015  . Pressure ulcer 07/30/2015  . Protein-calorie malnutrition, severe (HCC) 07/30/2015  .  Dehydration 07/29/2015  . Diabetes (HCC) 07/09/2015  . UTI (urinary tract infection) 06/19/2014  . Positive blood culture 06/19/2014  . Type 2 diabetes mellitus with complication, without long-term current use of insulin (HCC) 06/02/2014  . Other and unspecified hyperlipidemia 06/02/2014  . OSA on CPAP 06/02/2014  . Bilateral lower extremity edema: chronic with venous stasis changes 06/02/2014  . Gout 06/02/2014  . Hypertension     Past Surgical History:    Procedure Laterality Date  . LEFT HEART CATH AND CORONARY ANGIOGRAPHY Left 04/01/2020   Procedure: LEFT HEART CATH AND CORONARY ANGIOGRAPHY;  Surgeon: Laurier Nancy, MD;  Location: [facility] INVASIVE CV LAB;  Service: Cardiovascular;  Laterality: Left;       Family History  Problem Relation Age of Onset  . Dementia Mother   . Arthritis Father   . Hypertension Father   . Mental illness Sister   . Diabetes Sister   . Heart failure Neg Hx   . Kidney failure Neg Hx   . Cancer Neg Hx     Social History   Tobacco Use  . Smoking status: Never Smoker  . Smokeless tobacco: Never Used  Substance Use Topics  . Alcohol use: No  . Drug use: No    Home Medications Prior to Admission medications   Medication Sig Start Date End Date Taking? Authorizing Provider  ACETAMINOPHEN EXTRA STRENGTH 500 MG tablet Take 1,000 mg by mouth 2 (two) times daily. 02/14/20   [provider]  amLODipine (NORVASC) 10 MG tablet Take 1 tablet (10 mg total) by mouth daily. 02/06/20   Starkes-Perry, Juel Burrow, FNP  ARIPiprazole (ABILIFY) 20 MG tablet Take 1 tablet (20 mg total) by mouth daily. 02/06/20   Maryagnes Amos, FNP  aspirin EC 81 MG EC tablet Take 1 tablet (81 mg total) by mouth daily. 04/01/20   Dhungel, Theda Belfast, MD  atorvastatin (LIPITOR) 10 MG tablet Take 1 tablet (10 mg total) by mouth daily at 6 PM. Patient taking differently: Take 80 mg by mouth daily at 6 PM.  02/05/20   Starkes-Perry, Juel Burrow, FNP  buPROPion (WELLBUTRIN) 75 MG tablet Take 1 tablet (75 mg total) by mouth daily. 02/06/20   Maryagnes Amos, FNP  clopidogrel (PLAVIX) 75 MG tablet Take by mouth. 04/30/20   [provider]  divalproex (DEPAKOTE) 500 MG DR tablet Take 1 tablet (500 mg total) by mouth every 12 (twelve) hours. 02/05/20   Starkes-Perry, Juel Burrow, FNP  doxycycline (VIBRA-TABS) 100 MG tablet Take 1 tablet (100 mg total) by mouth 2 (two) times daily. 08/25/20   Vivi Barrack, DPM  empagliflozin (JARDIANCE)  10 MG TABS tablet Take by mouth. 06/11/20   [provider]  furosemide (LASIX) 20 MG tablet Take 1 tablet (20 mg total) by mouth daily. 10/24/19   Linwood Dibbles, MD  glimepiride (AMARYL) 2 MG tablet Take 2 mg by mouth daily with breakfast.    [provider]  insulin glargine (LANTUS SOLOSTAR) 100 UNIT/ML Solostar Pen daily as needed.    [provider]  levothyroxine (SYNTHROID) 25 MCG tablet Take by mouth.    [provider]  liothyronine (CYTOMEL) 25 MCG tablet Take 25 mcg by mouth daily before breakfast.     [provider]  lisinopril (ZESTRIL) 40 MG tablet Take 1 tablet (40 mg total) by mouth daily. 02/06/20   Starkes-Perry, Juel Burrow, FNP  metoprolol succinate (TOPROL-XL) 50 MG 24 hr tablet Take 1 tablet (50 mg total) by mouth daily. Take  with or immediately following a meal. Patient taking differently: Take 25 mg by mouth daily. Take with or immediately following a meal. 03/22/20   Marinda Elk, MD  mirtazapine (REMERON SOL-TAB) 15 MG disintegrating tablet Take 1 tablet (15 mg total) by mouth at bedtime. 02/05/20   Starkes-Perry, Juel Burrow, FNP  QUEtiapine (SEROQUEL) 25 MG tablet Take 25 mg by mouth 2 (two) times daily.     [provider]  QUEtiapine (SEROQUEL) 300 MG tablet Take 300 mg by mouth at bedtime.    [provider]  rivaroxaban (XARELTO) 20 MG TABS tablet Take 1 tablet (20 mg total) by mouth daily with supper. 04/02/20   Dhungel, Nishant, MD  sitaGLIPtin (JANUVIA) 25 MG tablet Take 25 mg by mouth daily.    [provider]    Allergies    Amlodipine and Zyprexa [olanzapine]  Review of Systems   Review of Systems  Neurological: Positive for speech difficulty.  All other systems reviewed and are negative.   Physical Exam Updated Vital Signs BP 136/77 (BP Location: Right Arm)   Pulse 99   Temp 98.9 F (37.2 C) (Oral)   Resp 19   Ht 6' (1.829 m)   Wt 127.5 kg   SpO2 96%   BMI 38.11 kg/m   Physical  Exam Vitals and nursing note reviewed.  Constitutional:      Appearance: Normal appearance. He is obese.  HENT:     Head: Normocephalic and atraumatic.     Right Ear: External ear normal.     Left Ear: External ear normal.     Nose: Nose normal.     Mouth/Throat:     Mouth: Mucous membranes are moist.     Pharynx: Oropharynx is clear.  Eyes:     Extraocular Movements: Extraocular movements intact.     Conjunctiva/sclera: Conjunctivae normal.     Pupils: Pupils are equal, round, and reactive to light.  Cardiovascular:     Rate and Rhythm: Normal rate and regular rhythm.     Pulses: Normal pulses.     Heart sounds: Normal heart sounds.  Pulmonary:     Effort: Pulmonary effort is normal.     Breath sounds: Normal breath sounds.  Abdominal:     General: Abdomen is flat. Bowel sounds are normal.     Palpations: Abdomen is soft.  Musculoskeletal:        General: Normal range of motion.     Cervical back: Normal range of motion and neck supple.     Right lower leg: Edema present.     Left lower leg: Edema present.  Skin:    General: Skin is warm.     Capillary Refill: Capillary refill takes less than 2 seconds.     Comments: Chronic skin changes BLE  Neurological:     Mental Status: He is alert.     Comments: Pt is slow to respond.  Speech is slow and difficult to understand.  Pt is following commands, but it takes him awhile to follow the command.  He is moving all 4 of his extremities.     ED Results / Procedures / Treatments   Labs (all labs ordered are listed, but only abnormal results are displayed) Labs Reviewed  ETHANOL  PROTIME-INR  APTT  CBC  DIFFERENTIAL  COMPREHENSIVE METABOLIC PANEL  RAPID URINE DRUG SCREEN, HOSP PERFORMED  URINALYSIS, ROUTINE W REFLEX MICROSCOPIC  VALPROIC ACID LEVEL  TSH  TROPONIN I (HIGH SENSITIVITY)    EKG EKG  Interpretation  Date/Time:  Tuesday October 07 2020 14:43:30 EST Ventricular Rate:  97 PR Interval:    QRS  Duration: 100 QT Interval:  326 QTC Calculation: 415 R Axis:   -47 Text Interpretation: Sinus rhythm LAD, consider left anterior fascicular block Consider anterior infarct No significant change since last tracing Confirmed by Jacalyn Lefevre [phone]) on 10/07/2020 2:57:42 PM   Radiology DG Chest Portable 1 View  Result Date: 10/07/2020 CLINICAL DATA:  Slurred speech EXAM: PORTABLE CHEST 1 VIEW COMPARISON:  Mar 31, 2020 FINDINGS: No edema or airspace opacity. Heart is slightly enlarged with pulmonary vascularity normal. No adenopathy. No bone lesions. IMPRESSION: Heart slightly enlarged.  No edema or airspace opacity. Electronically Signed   By: Bretta Bang III M.D.   On: 10/07/2020 15:01    Procedures Procedures (including critical care time)  Medications Ordered in ED Medications - No data to display  ED Course  I have reviewed the triage vital signs and the nursing notes.  Pertinent labs & imaging results that were available during my care of the patient were reviewed by me and considered in my medical decision making (see chart for details).    MDM Rules/Calculators/A&P                          Labs are pending at shift change.  Pt signed out to Dr. Silverio Lay. Final Clinical Impression(s) / ED Diagnoses Final diagnoses:  Altered mental status, unspecified altered mental status type    Rx / DC Orders ED Discharge Orders    None       Jacalyn Lefevre, MD 10/07/20 1528

## 2020-10-07 NOTE — H&P (Signed)
History and Physical    Christian [name] GUY:[id number] DOB: [birth year] DOA: 10/07/2020  PCP: [name], MD (Confirm with patient/family/NH records and if not entered, this has to be entered at [facility] point of entry) Patient coming from: Home  I have personally briefly reviewed patient's old medical records in [facility]  Chief Complaint: I cannot formulate sentences  HPI: Christian [name] is a 69 y.o. male with medical history significant of CAD with PCI and stenting x2, paroxysmal A. fib on Xarelto, IDDM, HLD, HTN, OSA on CPAP, PAD, CKD stage III, bipolar disorder, presented with slurred speech.  Patient lives by himself in the independent living, with daughter and sister visiting every day.  Patient has trouble in speech and unable to provide much history, will history provided by patient daughter over the phone.  Saturday night, daughter noticed that patient had slurred speech.  Sunday, daughter visited patient again and found the patient continued to have trouble speech and patient kept repeating the sentence " I cannot formulate sentences".  Daughter checked patient's glucose was in the 300.  She called PCP who set up appointment today.  Patient arrived in PCPs office in the morning, and reportedly was found to have left-sided facial droop and right-sided weakness and sent to the ED.  Daughter reported there was no new medication on patient medication list, and patient had a right foot ulcer appeared to be infected and the been following with podiatrist who treated the patient with 7 days of doxycycline about 2 weeks ago, and ulcer has healed. ED Course: CT head negative for acute findings, blood work showed AKI with creatinine 2.3 compared to 1.5 baseline and BUN 63 compared to 22 baseline. Review of Systems: Unable to perform, patient has trouble talking and appears to be confused as well.   Past Medical History:  Diagnosis Date  . Bilateral lower extremity edema: chronic with venous  stasis changes 06/02/2014  . Bipolar 1 disorder (HCC)   . Bipolar disorder (HCC)   . Chronic kidney disease   . Gout   . Gout   . Hx of blood clots   . Hypertension   . Mood swings   . OSA on CPAP 06/02/2014  . Other and unspecified hyperlipidemia 06/02/2014  . Type II or unspecified type diabetes mellitus with unspecified complication, uncontrolled 06/02/2014    Past Surgical History:  Procedure Laterality Date  . LEFT HEART CATH AND CORONARY ANGIOGRAPHY Left 04/01/2020   Procedure: LEFT HEART CATH AND CORONARY ANGIOGRAPHY;  Surgeon: Laurier Nancy, MD;  Location: [facility] INVASIVE CV LAB;  Service: Cardiovascular;  Laterality: Left;     reports that he has never smoked. He has never used smokeless tobacco. He reports that he does not drink alcohol and does not use drugs.  Allergies  Allergen Reactions  . Amlodipine Swelling and Other (See Comments)    Legs became swollen  . Onglyza [Saxagliptin] Other (See Comments)    "Allergic," per [facility]  . Zyprexa [Olanzapine] Other (See Comments)    Allergy noted by ACT Team. No reaction specified (??)- "Allergic," per [facility]    Family History  Problem Relation Age of Onset  . Dementia Mother   . Arthritis Father   . Hypertension Father   . Mental illness Sister   . Diabetes Sister   . Heart failure Neg Hx   . Kidney failure Neg Hx   . Cancer Neg Hx      Prior to Admission medications  Medication Sig Start Date End Date Taking? Authorizing Provider  acetaminophen (TYLENOL) 500 MG tablet Take 500-1,000 mg by mouth every 8 (eight) hours as needed for mild pain or headache.   Yes [provider]  amLODipine (NORVASC) 10 MG tablet Take 1 tablet (10 mg total) by mouth daily. 02/06/20  Yes Starkes-Perry, Juel Burrow, FNP  ARIPiprazole (ABILIFY) 20 MG tablet Take 1 tablet (20 mg total) by mouth daily. 02/06/20  Yes Starkes-Perry, Juel Burrow, FNP  atorvastatin (LIPITOR) 10 MG tablet Take 1 tablet (10 mg total) by mouth  daily at 6 PM. Patient taking differently: Take 10 mg by mouth daily.  02/05/20  Yes Starkes-Perry, Juel Burrow, FNP  buPROPion (WELLBUTRIN) 75 MG tablet Take 1 tablet (75 mg total) by mouth daily. 02/06/20  Yes Starkes-Perry, Juel Burrow, FNP  clopidogrel (PLAVIX) 75 MG tablet Take 75 mg by mouth daily.  04/30/20  Yes [provider]  divalproex (DEPAKOTE) 500 MG DR tablet Take 1 tablet (500 mg total) by mouth every 12 (twelve) hours. 02/05/20  Yes Starkes-Perry, Juel Burrow, FNP  empagliflozin (JARDIANCE) 10 MG TABS tablet Take 10 mg by mouth daily.  06/11/20  Yes [provider]  furosemide (LASIX) 40 MG tablet Take 40 mg by mouth in the morning.   Yes [provider]  glimepiride (AMARYL) 2 MG tablet Take 2 mg by mouth daily with breakfast.   Yes [provider]  insulin glargine (LANTUS SOLOSTAR) 100 UNIT/ML Solostar Pen Inject 6-7 Units into the skin See admin instructions. Inject 6 units into the skin in the morning and increase to 7 units if BGL is 150 or greater   Yes [provider]  liothyronine (CYTOMEL) 25 MCG tablet Take 25 mcg by mouth daily before breakfast.    Yes [provider]  lisinopril (ZESTRIL) 40 MG tablet Take 1 tablet (40 mg total) by mouth daily. 02/06/20  Yes Starkes-Perry, Juel Burrow, FNP  metoprolol succinate (TOPROL-XL) 50 MG 24 hr tablet Take 1 tablet (50 mg total) by mouth daily. Take with or immediately following a meal. 03/22/20  Yes Marinda Elk, MD  mirtazapine (REMERON SOL-TAB) 15 MG disintegrating tablet Take 1 tablet (15 mg total) by mouth at bedtime. 02/05/20  Yes Starkes-Perry, Juel Burrow, FNP  polyethylene glycol powder (GLYCOLAX/MIRALAX) 17 GM/SCOOP powder Take 17 g by mouth See admin instructions. Dissolve 17 grams of powder into 4-8 ounces of water and drink every one to two days   Yes [provider]  QUEtiapine (SEROQUEL) 25 MG tablet Take 25 mg by mouth 2 (two) times daily.    Yes [provider]    QUEtiapine (SEROQUEL) 300 MG tablet Take 300 mg by mouth at bedtime.   Yes [provider]  rivaroxaban (XARELTO) 20 MG TABS tablet Take 1 tablet (20 mg total) by mouth daily with supper. Patient taking differently: Take 20 mg by mouth daily.  04/02/20  Yes Dhungel, Nishant, MD  aspirin EC 81 MG EC tablet Take 1 tablet (81 mg total) by mouth daily. Patient not taking: Reported on 10/07/2020 04/01/20   Dhungel, Theda Belfast, MD  doxycycline (VIBRA-TABS) 100 MG tablet Take 1 tablet (100 mg total) by mouth 2 (two) times daily. Patient not taking: Reported on 10/07/2020 08/25/20   Vivi Barrack, [place]    Physical Exam: Vitals:   10/07/20 1645 10/07/20 1700 10/07/20 1715 10/07/20 1815  BP: 97/64 112/78 115/76 113/88  Pulse: 86 87 83 88  Resp: 15 14 14 17   Temp:  TempSrc:      SpO2: 94% 90% 90% 91%  Weight:      Height:        Constitutional: NAD, calm, comfortable Vitals:   10/07/20 1645 10/07/20 1700 10/07/20 1715 10/07/20 1815  BP: 97/64 112/78 115/76 113/88  Pulse: 86 87 83 88  Resp: 15 14 14 17   Temp:      TempSrc:      SpO2: 94% 90% 90% 91%  Weight:      Height:       Eyes: PERRL, lids and conjunctivae normal ENMT: Mucous membranes are dry. Posterior pharynx clear of any exudate or lesions.Normal dentition.  Neck: normal, supple, no masses, no thyromegaly Respiratory: clear to auscultation bilaterally, no wheezing, no crackles. Normal respiratory effort. No accessory muscle use.  Cardiovascular: Regular rate and rhythm, no murmurs / rubs / gallops. No extremity edema. 2+ pedal pulses. No carotid bruits.  Abdomen: no tenderness, no masses palpated. No hepatosplenomegaly. Bowel sounds positive.  Musculoskeletal: no clubbing / cyanosis. No joint deformity upper and lower extremities. Good ROM, no contractures. Normal muscle tone.  Skin: no rashes, lesions, ulcers. No induration Neurologic: No facial droop, muscle strength appears to be symmetrical 4/5 bilaterally upper  and lower limbs, light touch sensation grossly intact.  Speech appears to be staccato Psychiatric: Oriented to himself, otherwise hard to evaluate because of speech problem   Labs on Admission: I have personally reviewed following labs and imaging studies  CBC: Recent Labs  Lab 10/07/20 1534  WBC 5.9  NEUTROABS 4.6  HGB 12.3*  HCT 42.6  MCV 78.0*  PLT 217   Basic Metabolic Panel: Recent Labs  Lab 10/07/20 1534  NA 135  K 5.2*  CL 99  CO2 26  GLUCOSE 228*  BUN 63*  CREATININE 2.31*  CALCIUM 10.0   GFR: Estimated Creatinine Clearance: 42.8 mL/min (A) (by C-G formula based on SCr of 2.31 mg/dL (H)). Liver Function Tests: Recent Labs  Lab 10/07/20 1534  AST 14*  ALT 13  ALKPHOS 52  BILITOT 0.7  PROT 7.5  ALBUMIN 3.5   No results for input(s): LIPASE, AMYLASE in the last 168 hours. No results for input(s): AMMONIA in the last 168 hours. Coagulation Profile: Recent Labs  Lab 10/07/20 1534  INR 1.9*   Cardiac Enzymes: Recent Labs  Lab 10/07/20 1728  CKTOTAL 98   BNP (last 3 results) No results for input(s): PROBNP in the last 8760 hours. HbA1C: No results for input(s): HGBA1C in the last 72 hours. CBG: No results for input(s): GLUCAP in the last 168 hours. Lipid Profile: No results for input(s): CHOL, HDL, LDLCALC, TRIG, CHOLHDL, LDLDIRECT in the last 72 hours. Thyroid Function Tests: Recent Labs    10/07/20 1534  TSH 1.021   Anemia Panel: No results for input(s): VITAMINB12, FOLATE, FERRITIN, TIBC, IRON, RETICCTPCT in the last 72 hours. Urine analysis:    Component Value Date/Time   COLORURINE STRAW (A) 10/07/2020 1728   APPEARANCEUR CLEAR 10/07/2020 1728   APPEARANCEUR Cloudy 08/01/2014 1614   LABSPEC 1.010 10/07/2020 1728   LABSPEC 1.008 08/01/2014 1614   PHURINE 5.0 10/07/2020 1728   GLUCOSEU >=500 (A) 10/07/2020 1728   GLUCOSEU Negative 08/01/2014 1614   HGBUR NEGATIVE 10/07/2020 1728   BILIRUBINUR NEGATIVE 10/07/2020 1728    BILIRUBINUR Negative 08/01/2014 1614   KETONESUR NEGATIVE 10/07/2020 1728   PROTEINUR NEGATIVE 10/07/2020 1728   UROBILINOGEN 0.2 07/01/2014 1809   NITRITE NEGATIVE 10/07/2020 1728   LEUKOCYTESUR NEGATIVE 10/07/2020 1728  LEUKOCYTESUR Negative 08/01/2014 1614    Radiological Exams on Admission: CT HEAD WO CONTRAST  Result Date: 10/07/2020 CLINICAL DATA:  Neurologic deficit EXAM: CT HEAD WITHOUT CONTRAST TECHNIQUE: Contiguous axial images were obtained from the base of the skull through the vertex without intravenous contrast. COMPARISON:  08/22/2018 FINDINGS: Brain: No acute infarct or hemorrhage. Lateral ventricles and midline structures are unremarkable. No acute extra-axial fluid collections. No mass effect. Vascular: No hyperdense vessel. There is severe atherosclerosis of the bilateral internal carotid arteries, stable. Skull: Normal. Negative for fracture or focal lesion. Sinuses/Orbits: Minimal fluid within the right mastoid air cells. Paranasal sinuses are clear. Other: None. IMPRESSION: 1. No acute intrathoracic process. 2. Minimal fluid within the right mastoid air cells. 3. Stable atherosclerosis. Electronically Signed   By: Sharlet Salina M.D.   On: 10/07/2020 17:46   DG Chest Portable 1 View  Result Date: 10/07/2020 CLINICAL DATA:  Slurred speech EXAM: PORTABLE CHEST 1 VIEW COMPARISON:  Mar 31, 2020 FINDINGS: No edema or airspace opacity. Heart is slightly enlarged with pulmonary vascularity normal. No adenopathy. No bone lesions. IMPRESSION: Heart slightly enlarged.  No edema or airspace opacity. Electronically Signed   By: Bretta Bang III M.D.   On: 10/07/2020 15:01    EKG: Independently reviewed. Sinus, no ST-T changes.   Assessment/Plan Active Problems:   CVA (cerebral vascular accident) (HCC)  (please populate well all problems here in Problem List. (For example, if patient is on BP meds at home and you resume or decide to hold them, it is a problem that needs to be  her. Same for CAD, COPD, HLD and so on)  Acute aphasia -CVA suspected. -MRI and MRA, if MRI not showing any large stroke or hemorrhage concerns, will restart Xarelto. -Continue Plavix -Symptom onset more than 72 hours, will restart BP meds -Echo  AKI on CKD stage III -Has signs of hypovolemia, hold Lasix and ACEI -Appears to have left-sided mild hydronephrosis summer this year, will check renal ultrasound -UA shows no signs of UTI. -Normal saline at 125 mm/h x 1 day then reevaluate kidney function -No significant acidosis, uremia is mild, expected kidney function improved after hydration otherwise will consider nephrology consult.  Borderline hyperkalemia - LoKelma x1  Metabolic encephalopathy -In addition to speech problem, appears to have confusion, suspect this might related to uremia, versus some other unknown etiologies. -Hydration as above, follow-up BUN level. -TSH with normal limits, send RPR.  IDDM with hyperglycemia -At least part of the problem is from dehydration, hold Metformin and sulfonylurea -Start sliding scale  HTN -Hold Lasix and ACEI -Continue amlodipine and beta-blocker  A. Fib -Incentive summary, continue beta-blocker, hold Xarelto until MRI confirms no large infarction/hemorrhage risk.  OSA -Check VBG -CPAP at bedtime  Bipolar disorder -According to daughter, symptoms has been controlled, compared to his medications in June this year, there is no change of his SSRI regimen.   DVT prophylaxis: Plan for heparin subcu tonight, if MRI confirmed no bleeding risk restart Xarelto tomorrow Code Status: Full code Family Communication: Daughter over phone Disposition Plan: Patient can complicated medical problems of CVA like symptoms and decline, expect more than 2 midnight hospital stay Consults called: Neuro Admission status: Tele admit   Emeline General MD [facility] Pager [phone]  10/07/2020, 6:40 PM

## 2020-10-07 NOTE — Progress Notes (Signed)
Patient refused use of CPAP for the evening 

## 2020-10-07 NOTE — ED Notes (Signed)
Pt currently in MRI at this time. Will reassess vitals upon pt's return.

## 2020-10-08 ENCOUNTER — Encounter (HOSPITAL_COMMUNITY): Payer: Self-pay | Admitting: Internal Medicine

## 2020-10-08 ENCOUNTER — Inpatient Hospital Stay (HOSPITAL_COMMUNITY): Payer: Medicare PPO

## 2020-10-08 DIAGNOSIS — I6389 Other cerebral infarction: Secondary | ICD-10-CM

## 2020-10-08 DIAGNOSIS — I480 Paroxysmal atrial fibrillation: Secondary | ICD-10-CM

## 2020-10-08 DIAGNOSIS — N179 Acute kidney failure, unspecified: Principal | ICD-10-CM

## 2020-10-08 DIAGNOSIS — R4701 Aphasia: Secondary | ICD-10-CM

## 2020-10-08 DIAGNOSIS — I10 Essential (primary) hypertension: Secondary | ICD-10-CM

## 2020-10-08 LAB — GLUCOSE, CAPILLARY
Glucose-Capillary: 115 mg/dL — ABNORMAL HIGH (ref 70–99)
Glucose-Capillary: 142 mg/dL — ABNORMAL HIGH (ref 70–99)
Glucose-Capillary: 144 mg/dL — ABNORMAL HIGH (ref 70–99)
Glucose-Capillary: 202 mg/dL — ABNORMAL HIGH (ref 70–99)

## 2020-10-08 LAB — ECHOCARDIOGRAM COMPLETE
Area-P 1/2: 4.54 cm2
Calc EF: 67.9 %
Height: 72 in
S' Lateral: 3.05 cm
Single Plane A2C EF: 60.4 %
Single Plane A4C EF: 72.7 %
Weight: 4496 oz

## 2020-10-08 LAB — AMMONIA: Ammonia: 37 umol/L — ABNORMAL HIGH (ref 9–35)

## 2020-10-08 LAB — BLOOD GAS, VENOUS
Acid-Base Excess: 1.5 mmol/L (ref 0.0–2.0)
Bicarbonate: 26.1 mmol/L (ref 20.0–28.0)
FIO2: 21
O2 Saturation: 80.6 %
Patient temperature: 37
pCO2, Ven: 45.9 mmHg (ref 44.0–60.0)
pH, Ven: 7.374 (ref 7.250–7.430)
pO2, Ven: 50.5 mmHg — ABNORMAL HIGH (ref 32.0–45.0)

## 2020-10-08 LAB — BASIC METABOLIC PANEL
Anion gap: 8 (ref 5–15)
BUN: 54 mg/dL — ABNORMAL HIGH (ref 8–23)
CO2: 24 mmol/L (ref 22–32)
Calcium: 9.4 mg/dL (ref 8.9–10.3)
Chloride: 102 mmol/L (ref 98–111)
Creatinine, Ser: 1.92 mg/dL — ABNORMAL HIGH (ref 0.61–1.24)
GFR, Estimated: 38 mL/min — ABNORMAL LOW (ref >60)
Glucose, Bld: 150 mg/dL — ABNORMAL HIGH (ref 70–99)
Potassium: 4.6 mmol/L (ref 3.5–5.1)
Sodium: 134 mmol/L — ABNORMAL LOW (ref 135–145)

## 2020-10-08 LAB — LIPID PANEL
Cholesterol: 128 mg/dL (ref 0–200)
HDL: 33 mg/dL — ABNORMAL LOW (ref >40)
LDL Cholesterol: 81 mg/dL (ref 0–99)
Total CHOL/HDL Ratio: 3.9 RATIO
Triglycerides: 72 mg/dL (ref <150)
VLDL: 14 mg/dL (ref 0–40)

## 2020-10-08 LAB — HEMOGLOBIN A1C
Hgb A1c MFr Bld: 11.1 % — ABNORMAL HIGH (ref 4.8–5.6)
Mean Plasma Glucose: 271.87 mg/dL

## 2020-10-08 LAB — RPR: RPR Ser Ql: NONREACTIVE

## 2020-10-08 MED ORDER — RIVAROXABAN 20 MG PO TABS
20.0000 mg | ORAL_TABLET | Freq: Every day | ORAL | Status: DC
  Administered 2020-10-09: 20 mg via ORAL
  Filled 2020-10-08: qty 1

## 2020-10-08 MED ORDER — PERFLUTREN LIPID MICROSPHERE
1.0000 mL | INTRAVENOUS | Status: AC | PRN
  Administered 2020-10-08: 2 mL via INTRAVENOUS
  Filled 2020-10-08: qty 10

## 2020-10-08 MED ORDER — STROKE: EARLY STAGES OF RECOVERY BOOK
Status: AC
  Filled 2020-10-08: qty 1

## 2020-10-08 MED ORDER — PANTOPRAZOLE SODIUM 40 MG PO TBEC
40.0000 mg | DELAYED_RELEASE_TABLET | Freq: Every day | ORAL | Status: DC
  Administered 2020-10-09 – 2020-10-16 (×8): 40 mg via ORAL
  Filled 2020-10-08 (×8): qty 1

## 2020-10-08 MED ORDER — ATORVASTATIN CALCIUM 80 MG PO TABS
80.0000 mg | ORAL_TABLET | Freq: Every day | ORAL | Status: DC
  Administered 2020-10-08 – 2020-10-16 (×9): 80 mg via ORAL
  Filled 2020-10-08 (×9): qty 1

## 2020-10-08 NOTE — Progress Notes (Signed)
PROGRESS NOTE        PATIENT DETAILS Name: Christian [name] Age: 69 y.o. Sex: male Date of Birth: 10/10/1951 Admit Date: 10/07/2020 Admitting Physician Emeline General, MD PCP:Sun, Charise Carwin, MD  Brief Narrative: Patient is a 69 y.o. male CAD s/p PCI, PAF on Xarelto, DM-2, HTN, HLD, CKD stage IIIa, bipolar disorder, OSA on CPAP-presented to the ED after he had difficulty formulating sentences.  He was subsequently admitted for further evaluation and treatment.  Significant events: 11/9>> admit with aphasia  Significant studies: 11/9>> chest x-ray: No pneumonia. 11/9>> CT head: No acute intracranial process 11/9>> MRI brain: No acute intracranial abnormality. 11/9>> MRI brain: Chronically advanced intracranial atherosclerosis. 11/10>> Echo: EF 65-70%, grade 1 diastolic dysfunction. 11/10>> LDL: 81 11/10>> A1c 11.1  Antimicrobial therapy: None  Microbiology data: None  Procedures : None  Consults: Neurology  DVT Prophylaxis : rivaroxaban (XARELTO) tablet 20 mg    Subjective: Somewhat inconsistent-at times appears to speak a bit more-but able to tell me that he is not able to formulate words this morning.  Sometimes he is not following commands as well.  Assessment/Plan: Aphasia: Speech nonfluent but at times appears inconsistent-MRI brain negative for CVA-Dr. Pearlean Brownie feels that he may be encephalopathic-however apart from mild AKI-no other causes for encephalopathy.  UA/chest x-ray negative for infection-he is afebrile as well.  Plan is to continue supportive care-monitor closely for other sources of encephalopathy.  Per Dr. Joan Mayans does not need bedrest-as aphasia not felt to be related to significant intracranial stenosis.  We will go ahead and discontinue bedrest orders.    Acute metabolic encephalopathy: See above.  AKI on CKD stage IIIa: AKI likely hemodynamically mediated-improving with supportive care-however not yet back to baseline.   Decrease IV fluid to 60 cc an hour  HTN: BP stable-continue metoprolol.  Hypothyroidism: Continue Cytomel.  TSH within normal limits.  CAD s/p PCI to LAD on 04/29/2020: Reviewed outpatient cardiology note-continue Plavix.  PAF: Currently in sinus rhythm-continue Xarelto and metoprolol.  Note-appears to be on both Plavix and Xarelto-being followed by cardiology in the outpatient setting-defer further adjustments to his primary cardiologist at [facility].  Bipolar disorder: Continue Seroquel,Wellbutrin and Depakote.  Obesity: Estimated body mass index is 38.11 kg/m as calculated from the following:   Height as of this encounter: 6' (1.829 m).   Weight as of this encounter: 127.5 kg.    Diet: Diet Order            Diet heart healthy/carb modified Room service appropriate? Yes; Fluid consistency: Thin  Diet effective now                  Code Status: Full code   Family Communication: Spoke with Daughter [name] [phone]) over phone 11/10  Disposition Plan: Status is: Inpatient  Remains inpatient appropriate because:Inpatient level of care appropriate due to severity of illness   Dispo: The patient is from: Home              Anticipated d/c is to: TBD              Anticipated d/c date is: 1 day              Patient currently is not medically stable to d/c.     Barriers to Discharge:  Antimicrobial agents: Anti-infectives (From admission, onward)   None  Time spent: 25 minutes-Greater than 50% of this time was spent in counseling, explanation of diagnosis, planning of further management, and coordination of care.  MEDICATIONS: Scheduled Meds: . ARIPiprazole  20 mg Oral Daily  . atorvastatin  80 mg Oral Daily  . buPROPion  75 mg Oral Daily  . clopidogrel  75 mg Oral Daily  . divalproex  500 mg Oral Q12H  . insulin aspart  0-15 Units Subcutaneous TID WC  . insulin aspart  0-5 Units Subcutaneous QHS  . liothyronine  25 mcg Oral QAC breakfast  . metoprolol  succinate  50 mg Oral Daily  . mirtazapine  15 mg Oral QHS  . QUEtiapine  25 mg Oral BID  . QUEtiapine  300 mg Oral QHS  . rivaroxaban  20 mg Oral Q supper   Continuous Infusions: . sodium chloride 125 mL/hr at 10/08/20 0917   PRN Meds:.acetaminophen **OR** acetaminophen (TYLENOL) oral liquid 160 mg/5 mL **OR** acetaminophen, hydrALAZINE, senna-docusate   PHYSICAL EXAM: Vital signs: Vitals:   10/08/20 0240 10/08/20 0440 10/08/20 0640 10/08/20 1246  BP: 110/73 128/64 123/72 111/66  Pulse: 84 89  91  Resp: 14 16 16    Temp: 98.5 F (36.9 C) 98.5 F (36.9 C) 98.7 F (37.1 C)   TempSrc: Oral Oral Oral   SpO2: 96% 93%  96%  Weight:      Height:       Filed Weights   10/07/20 1457  Weight: 127.5 kg   Body mass index is 38.11 kg/m.   Gen Exam:Alert awake-not in any distress HEENT:atraumatic, normocephalic Chest: B/L clear to auscultation anteriorly CVS:S1S2 regular Abdomen:soft non tender, non distended Extremities:no edema Neurology: Non focal Skin: no rash  I have personally reviewed following labs and imaging studies  LABORATORY DATA: CBC: Recent Labs  Lab 10/07/20 1534  WBC 5.9  NEUTROABS 4.6  HGB 12.3*  HCT 42.6  MCV 78.0*  PLT 217    Basic Metabolic Panel: Recent Labs  Lab 10/07/20 1534 10/08/20 0353  NA 135 134*  K 5.2* 4.6  CL 99 102  CO2 26 24  GLUCOSE 228* 150*  BUN 63* 54*  CREATININE 2.31* 1.92*  CALCIUM 10.0 9.4    GFR: Estimated Creatinine Clearance: 51.5 mL/min (A) (by C-G formula based on SCr of 1.92 mg/dL (H)).  Liver Function Tests: Recent Labs  Lab 10/07/20 1534  AST 14*  ALT 13  ALKPHOS 52  BILITOT 0.7  PROT 7.5  ALBUMIN 3.5   No results for input(s): LIPASE, AMYLASE in the last 168 hours. No results for input(s): AMMONIA in the last 168 hours.  Coagulation Profile: Recent Labs  Lab 10/07/20 1534  INR 1.9*    Cardiac Enzymes: Recent Labs  Lab 10/07/20 1728  CKTOTAL 98    BNP (last 3 results) No  results for input(s): PROBNP in the last 8760 hours.  Lipid Profile: Recent Labs    10/08/20 0353  CHOL 128  HDL 33*  LDLCALC 81  TRIG 72  CHOLHDL 3.9    Thyroid Function Tests: Recent Labs    10/07/20 1534  TSH 1.021    Anemia Panel: No results for input(s): VITAMINB12, FOLATE, FERRITIN, TIBC, IRON, RETICCTPCT in the last 72 hours.  Urine analysis:    Component Value Date/Time   COLORURINE STRAW (A) 10/07/2020 1728   APPEARANCEUR CLEAR 10/07/2020 1728   APPEARANCEUR Cloudy 08/01/2014 1614   LABSPEC 1.010 10/07/2020 1728   LABSPEC 1.008 08/01/2014 1614   PHURINE 5.0 10/07/2020 1728   GLUCOSEU >=  500 (A) 10/07/2020 1728   GLUCOSEU Negative 08/01/2014 1614   HGBUR NEGATIVE 10/07/2020 1728   BILIRUBINUR NEGATIVE 10/07/2020 1728   BILIRUBINUR Negative 08/01/2014 1614   KETONESUR NEGATIVE 10/07/2020 1728   PROTEINUR NEGATIVE 10/07/2020 1728   UROBILINOGEN 0.2 07/01/2014 1809   NITRITE NEGATIVE 10/07/2020 1728   LEUKOCYTESUR NEGATIVE 10/07/2020 1728   LEUKOCYTESUR Negative 08/01/2014 1614    Sepsis Labs: Lactic Acid, Venous    Component Value Date/Time   LATICACIDVEN 0.7 06/17/2014 2338    MICROBIOLOGY: Recent Results (from the past 240 hour(s))  Respiratory Panel by RT PCR (Flu A&B, Covid) - Nasopharyngeal Swab     Status: None   Collection Time: 10/07/20  3:38 PM   Specimen: Nasopharyngeal Swab  Result Value Ref Range Status   SARS Coronavirus 2 by RT PCR NEGATIVE NEGATIVE Final    Comment: (NOTE) SARS-CoV-2 target nucleic acids are NOT DETECTED.  The SARS-CoV-2 RNA is generally detectable in upper respiratoy specimens during the acute phase of infection. The lowest concentration of SARS-CoV-2 viral copies this assay can detect is 131 copies/mL. A negative result does not preclude SARS-Cov-2 infection and should not be used as the sole basis for treatment or other patient management decisions. A negative result may occur with  improper specimen  collection/handling, submission of specimen other than nasopharyngeal swab, presence of viral mutation(s) within the areas targeted by this assay, and inadequate number of viral copies (<131 copies/mL). A negative result must be combined with clinical observations, patient history, and epidemiological information. The expected result is Negative.  Fact Sheet for Patients:  https://www.moore.com/  Fact Sheet for Healthcare Providers:  https://www.young.biz/  This test is no t yet approved or cleared by the [place] FDA and  has been authorized for detection and/or diagnosis of SARS-CoV-2 by FDA under an Emergency Use Authorization (EUA). This EUA will remain  in effect (meaning this test can be used) for the duration of the COVID-19 declaration under Section 564(b)(1) of the Act, 21 U.S.C. section 360bbb-3(b)(1), unless the authorization is terminated or revoked sooner.     Influenza A by PCR NEGATIVE NEGATIVE Final   Influenza B by PCR NEGATIVE NEGATIVE Final    Comment: (NOTE) The Xpert Xpress SARS-CoV-2/FLU/RSV assay is intended as an aid in  the diagnosis of influenza from Nasopharyngeal swab specimens and  should not be used as a sole basis for treatment. Nasal washings and  aspirates are unacceptable for Xpert Xpress SARS-CoV-2/FLU/RSV  testing.  Fact Sheet for Patients: https://www.moore.com/  Fact Sheet for Healthcare Providers: https://www.young.biz/  This test is not yet approved or cleared by the [place] FDA and  has been authorized for detection and/or diagnosis of SARS-CoV-2 by  FDA under an Emergency Use Authorization (EUA). This EUA will remain  in effect (meaning this test can be used) for the duration of the  Covid-19 declaration under Section 564(b)(1) of the Act, 21  U.S.C. section 360bbb-3(b)(1), unless the authorization is  terminated or revoked. Performed at [facility], [address], [place], [place] [address]     RADIOLOGY STUDIES/RESULTS: CT HEAD WO CONTRAST  Result Date: 10/07/2020 CLINICAL DATA:  Neurologic deficit EXAM: CT HEAD WITHOUT CONTRAST TECHNIQUE: Contiguous axial images were obtained from the base of the skull through the vertex without intravenous contrast. COMPARISON:  08/22/2018 FINDINGS: Brain: No acute infarct or hemorrhage. Lateral ventricles and midline structures are unremarkable. No acute extra-axial fluid collections. No mass effect. Vascular: No hyperdense vessel. There is severe atherosclerosis of  the bilateral internal carotid arteries, stable. Skull: Normal. Negative for fracture or focal lesion. Sinuses/Orbits: Minimal fluid within the right mastoid air cells. Paranasal sinuses are clear. Other: None. IMPRESSION: 1. No acute intrathoracic process. 2. Minimal fluid within the right mastoid air cells. 3. Stable atherosclerosis. Electronically Signed   By: Sharlet Salina M.D.   On: 10/07/2020 17:46   MR ANGIO HEAD WO CONTRAST  Result Date: 10/07/2020 CLINICAL DATA:  69 year old male with altered mental status, neurologic deficit, TIA. EXAM: MRA HEAD WITHOUT CONTRAST TECHNIQUE: Angiographic images of the Circle of Willis were obtained using MRA technique without intravenous contrast. COMPARISON:  Brain MRI today.  Intracranial MRA 03/07/2011. FINDINGS: Severe stenosis of the diminutive distal right vertebral artery demonstrated in 2012, now with essentially absent flow signal in the right V4 segment. The distal left vertebral artery remains patent. Visible left PICA remains patent. The left vertebral supplies the basilar with moderate to severe increased stenosis at the left vertebrobasilar junction (series 1052, image 11. There is minimal reconstitution of the right vertebrobasilar junction. Patent basilar artery with chronic mild irregularity and stenosis. SCA and left PCA origins remain patent. Fetal right PCA origin again  noted. Diminutive or absent left posterior communicating artery. Moderate to severe bilateral PCA P2 segment stenoses are chronic (image 1046 image 18 and 19) and not significantly changed. Preserved distal PCA flow signal. Antegrade flow in both ICA siphons. Severe bilateral distal ICA irregularity and stenosis, on the left just proximal to the ICA terminus (series 1040, image 7) and on the right at the anterior genu (same image). These have substantially progressed since 2012. Carotid termini remain patent. Mild to moderate right ACA origin stenosis is increased. Anterior communicating artery and visible ACA branches are otherwise within normal limits. Left MCA M1 mild irregularity and stenosis has not significantly changed. Left MCA trifurcation remains patent. Visible left MCA branches are stable. Moderate chronic right MCA M1 stenosis is stable (series 10 40 image 5). Mild to moderate stenosis also at the right MCA bifurcation is stable (image 13). Visible right MCA branches appear stable. IMPRESSION: 1. Chronically advanced intracranial atherosclerosis. 2. The following have progressed since a 2012 MRA: - occlusion of the non-dominant distal Right Vertebral Artery. - Moderate to Severe stenosis at the Left Vertebrobasilar Junction. - Severe stenosis of both ICA siphons (right anterior genu and left supraclinoid segment). - mild to moderate Right ACA origin stenosis. 3. The following are stable since 2012: - Moderate Right MCA M1 stenosis. - Moderate to Severe bilateral PCA P2 stenoses. Electronically Signed   By: Odessa Fleming M.D.   On: 10/07/2020 19:34   MR BRAIN WO CONTRAST  Result Date: 10/07/2020 CLINICAL DATA:  69 year old male with altered mental status, neurologic deficit, TIA. EXAM: MRI HEAD WITHOUT CONTRAST TECHNIQUE: Multiplanar, multiecho pulse sequences of the brain and surrounding structures were obtained without intravenous contrast. COMPARISON:  Head CT earlier today.  Brain MRI 03/07/2011.  FINDINGS: Brain: No restricted diffusion to suggest acute infarction. No midline shift, mass effect, evidence of mass lesion, ventriculomegaly, extra-axial collection or acute intracranial hemorrhage. Cervicomedullary junction and pituitary are within normal limits. Mild for age vague, patchy cerebral white matter T2 and FLAIR hyperintensity is stable. No cortical encephalomalacia identified. Chronic lacunar infarct medial left pons is subtle and was better visualized in 2012. No chronic cerebral blood products identified. Deep gray matter nuclei and cerebellum remain within normal limits. Vascular: Major intracranial vascular flow voids appear stable since 2012 including decreased distal right vertebral artery flow  void (series 15, image 3 today). Generalized intracranial artery tortuosity again noted. Skull and upper cervical spine: Visible bone marrow signal and cervical spine appear stable since 2012. Sinuses/Orbits: Stable, negative.  Hyperplastic paranasal sinuses. Other: Mild chronic mastoid effusions, regressed on the left but increased on the right since 2012. Still, significance is doubtful. Other visible internal auditory structures appear normal. Visible scalp and face appear negative. IMPRESSION: 1. No acute intracranial abnormality. 2. Mild for age chronic small vessel disease, not significantly changed since 2012. 3. Evidence of chronically decreased flow in the distal right vertebral artery. See also MRA today reported separately. Electronically Signed   By: Odessa Fleming M.D.   On: 10/07/2020 19:25   US RENAL  Result Date: 10/07/2020 CLINICAL DATA:  Acute kidney injury EXAM: RENAL / URINARY TRACT ULTRASOUND COMPLETE COMPARISON:  Renal ultrasound 07/31/2014, CT abdomen pelvis 08/01/2014 FINDINGS: Right Kidney: Renal measurements: 11.2 x 5.4 x 4.4 cm = volume: 139 mL. Echogenicity is within normal limits. No concerning renal mass, shadowing calculus or hydronephrosis. Left Kidney: Renal measurements:  11.9 x 6.1 x 5.8 cm = volume: 219 mL. Echogenicity is within normal limits. No concerning renal mass, shadowing calculus or hydronephrosis. Bladder: Moderate bladder distention without wall thickening, calculi or debris. Other: None. IMPRESSION: Moderate bladder distension without other concerning bladder abnormality. Otherwise unremarkable renal ultrasound. Electronically Signed   By: Kreg Shropshire M.D.   On: 10/07/2020 19:35   DG Chest Portable 1 View  Result Date: 10/07/2020 CLINICAL DATA:  Slurred speech EXAM: PORTABLE CHEST 1 VIEW COMPARISON:  Mar 31, 2020 FINDINGS: No edema or airspace opacity. Heart is slightly enlarged with pulmonary vascularity normal. No adenopathy. No bone lesions. IMPRESSION: Heart slightly enlarged.  No edema or airspace opacity. Electronically Signed   By: Bretta Bang III M.D.   On: 10/07/2020 15:01   ECHOCARDIOGRAM COMPLETE  Result Date: 10/08/2020    ECHOCARDIOGRAM REPORT   Patient Name:   ELZY TOMASELLO Date of Exam: 10/08/2020 Medical Rec #:  [id number]        Height:       72.0 in Accession #:    [id number]       Weight:       281.0 lb Date of Birth:  1951-10-04        BSA:          2.462 m Patient Age:    21 years         BP:           123/72 mmHg Patient Gender: M                HR:           86 bpm. Exam Location:  Inpatient Procedure: 2D Echo and Intracardiac Opacification Agent Indications:    TIA 435.9 / G45.9  History:        Patient has prior history of Echocardiogram examinations, most                 recent 03/18/2020. Risk Factors:Hypertension, Diabetes and Sleep                 Apnea. Chronic kidney disease.  Sonographer:    Leta Jungling RDCS Referring Phys: [id number] Emeline General  Sonographer Comments: Technically difficult study due to poor echo windows. IMPRESSIONS  1. Left ventricular ejection fraction, by estimation, is 65 to 70%. The left ventricle has normal function. The left ventricle has no regional wall motion abnormalities. There is mild  left  ventricular hypertrophy. Left ventricular diastolic parameters are consistent with Grade I diastolic dysfunction (impaired relaxation).  2. Right ventricular systolic function is normal. The right ventricular size is normal.  3. The mitral valve is normal in structure. No evidence of mitral valve regurgitation. No evidence of mitral stenosis. Severe mitral annular calcification.  4. The aortic valve is tricuspid. There is moderate calcification of the aortic valve. There is moderate thickening of the aortic valve. Aortic valve regurgitation is not visualized. Mild to moderate aortic valve sclerosis/calcification is present, without any evidence of aortic stenosis.  5. The inferior vena cava is normal in size with greater than 50% respiratory variability, suggesting right atrial pressure of 3 mmHg. Conclusion(s)/Recommendation(s): No intracardiac source of embolism detected on this transthoracic study. A transesophageal echocardiogram is recommended to exclude cardiac source of embolism if clinically indicated. FINDINGS  Left Ventricle: Left ventricular ejection fraction, by estimation, is 65 to 70%. The left ventricle has normal function. The left ventricle has no regional wall motion abnormalities. Definity contrast agent was given IV to delineate the left ventricular  endocardial borders. The left ventricular internal cavity size was normal in size. There is mild left ventricular hypertrophy. Left ventricular diastolic parameters are consistent with Grade I diastolic dysfunction (impaired relaxation). Right Ventricle: The right ventricular size is normal. No increase in right ventricular wall thickness. Right ventricular systolic function is normal. Left Atrium: Left atrial size was normal in size. Right Atrium: Right atrial size was normal in size. Pericardium: There is no evidence of pericardial effusion. Mitral Valve: The mitral valve is normal in structure. There is moderate thickening of the mitral valve  leaflet(s). There is moderate calcification of the mitral valve leaflet(s). Severe mitral annular calcification. No evidence of mitral valve regurgitation. No evidence of mitral valve stenosis. Tricuspid Valve: The tricuspid valve is normal in structure. Tricuspid valve regurgitation is not demonstrated. No evidence of tricuspid stenosis. Aortic Valve: The aortic valve is tricuspid. There is moderate calcification of the aortic valve. There is moderate thickening of the aortic valve. Aortic valve regurgitation is not visualized. Mild to moderate aortic valve sclerosis/calcification is present, without any evidence of aortic stenosis. Pulmonic Valve: The pulmonic valve was normal in structure. Pulmonic valve regurgitation is not visualized. No evidence of pulmonic stenosis. Aorta: The aortic root is normal in size and structure. Venous: The inferior vena cava is normal in size with greater than 50% respiratory variability, suggesting right atrial pressure of 3 mmHg. IAS/Shunts: No atrial level shunt detected by color flow Doppler.  LEFT VENTRICLE PLAX 2D LVIDd:         4.20 cm      Diastology LVIDs:         3.05 cm      LV e' medial:    5.00 cm/s LV PW:         1.00 cm      LV E/e' medial:  12.9 LV IVS:        1.20 cm      LV e' lateral:   6.09 cm/s LVOT diam:     2.05 cm      LV E/e' lateral: 10.6 LV SV:         50 LV SV Index:   20 LVOT Area:     3.30 cm  LV Volumes (MOD) LV vol d, MOD A2C: 78.6 ml LV vol d, MOD A4C: 172.0 ml LV vol s, MOD A2C: 31.1 ml LV vol s, MOD A4C: 46.9 ml LV  SV MOD A2C:     47.5 ml LV SV MOD A4C:     172.0 ml LV SV MOD BP:      82.0 ml RIGHT VENTRICLE TAPSE (M-mode): 2.3 cm LEFT ATRIUM             Index       RIGHT ATRIUM           Index LA diam:        3.50 cm 1.42 cm/m  RA Area:     14.60 cm LA Vol (A2C):   43.0 ml 17.47 ml/m RA Volume:   30.30 ml  12.31 ml/m LA Vol (A4C):   42.3 ml 17.18 ml/m LA Biplane Vol: 44.1 ml 17.91 ml/m  AORTIC VALVE LVOT Vmax:   91.60 cm/s LVOT Vmean:   63.800 cm/s LVOT VTI:    0.150 m  AORTA Ao Root diam: 3.30 cm Ao Asc diam:  3.40 cm MITRAL VALVE MV Area (PHT): 4.54 cm    SHUNTS MV Decel Time: 167 msec    Systemic VTI:  0.15 m MV E velocity: 64.50 cm/s  Systemic Diam: 2.05 cm MV A velocity: 90.30 cm/s MV E/A ratio:  0.71 Donato Schultz MD Electronically signed by Donato Schultz MD Signature Date/Time: 10/08/2020/11:31:40 AM    Final      LOS: 1 day   Jeoffrey Massed, MD  [facility]    To contact the attending provider between 7A-7P or the covering provider during after hours 7P-7A, please log into the web site [email or web address] and access using universal [place] password for that web site. If you do not have the password, please call the hospital operator.  10/08/2020, 2:08 PM

## 2020-10-08 NOTE — Evaluation (Signed)
Physical Therapy Evaluation Patient Details Name: Christian [name] MRN: [id number] DOB: 01-Jan-1951 Today's Date: 10/08/2020   History of Present Illness  Patient is a 69 y.o. male CAD s/p PCI, PAF on Xarelto, DM-2, HTN, HLD, CKD stage IIIa, bipolar disorder, OSA on CPAP-presented to the ED with aphasia. MRI brain negative for acute intracranial abnormality. MRI of the brain shows diminished flow in the right vertebral artery.  Clinical Impression  Prior to admission, pt lives alone in an apartment, uses a Rollator, and requires assist for some ADL's/IADL's from daughter. Pt with improving cognition; now A&Ox3 and following all one step commands. Pt acknowledges expressive speech is still impaired compared to his baseline. Requiring min assist for transfers and ambulating 25 feet with a walker. As pt lives alone, will need to be modI to progress home, so currently recommending SNF at discharge. Will continue to follow to reassess.    Follow Up Recommendations SNF;Supervision/Assistance - 24 hour    Equipment Recommendations  None recommended by PT    Recommendations for Other Services       Precautions / Restrictions Precautions Precautions: Fall Restrictions Weight Bearing Restrictions: No      Mobility  Bed Mobility Overal bed mobility: Needs Assistance Bed Mobility: Supine to Sit     Supine to sit: Supervision          Transfers Overall transfer level: Needs assistance Equipment used: Rolling walker (2 wheeled) Transfers: Sit to/from Stand Sit to Stand: Min assist         General transfer comment: MinA to rise from edge of bed  Ambulation/Gait Ambulation/Gait assistance: Min assist;+2 safety/equipment Gait Distance (Feet): 25 Feet Assistive device: Rolling walker (2 wheeled) Gait Pattern/deviations: Step-through pattern;Decreased stride length Gait velocity: decreased   General Gait Details: MinA for stability, +2 lines/equipment  Stairs             Wheelchair Mobility    Modified Rankin (Stroke Patients Only)       Balance Overall balance assessment: Needs assistance Sitting-balance support: Feet supported Sitting balance-Leahy Scale: Good     Standing balance support: Bilateral upper extremity supported Standing balance-Leahy Scale: Poor                               Pertinent Vitals/Pain Pain Assessment: No/denies pain    Home Living Family/patient expects to be discharged to:: Private residence Living Arrangements: Alone Available Help at Discharge: Family;Available PRN/intermittently Type of Home: Apartment Home Access: Level entry     Home Layout: One level Home Equipment: Walker - 4 wheels;Bedside commode;Shower seat      Prior Function Level of Independence: Needs assistance   Gait / Transfers Assistance Needed: Ind amb mostly without an AD limited community distances with occasional use of a rollator when fatigued, no fall history, Mod Ind with bed mob with a rail and Ind with transfers  ADL's / Homemaking Assistance Needed: Daughter visits patient daily and assists with bathing, dressing, errands, and groceries        Hand Dominance        Extremity/Trunk Assessment   Upper Extremity Assessment Upper Extremity Assessment: Defer to OT evaluation    Lower Extremity Assessment Lower Extremity Assessment: Overall WFL for tasks assessed    Cervical / Trunk Assessment Cervical / Trunk Assessment: Normal  Communication   Communication: Expressive difficulties  Cognition Arousal/Alertness: Awake/alert Behavior During Therapy: Flat affect Overall Cognitive Status: Impaired/Different from baseline Area of Impairment:  Awareness                           Awareness: Emergent   General Comments: Pt now A&Ox3, flat affect, decreased initiation at times of responses. Pt able to verbalize he was "wet," due to male purewick leaking and that his speech was impaired       General Comments  VSS on RA    Exercises     Assessment/Plan    PT Assessment Patient needs continued PT services  PT Problem List Decreased strength;Decreased balance;Decreased mobility;Decreased cognition       PT Treatment Interventions DME instruction;Gait training;Functional mobility training;Therapeutic activities;Therapeutic exercise;Balance training;Patient/family education    PT Goals (Current goals can be found in the Care Plan section)  Acute Rehab PT Goals Patient Stated Goal: did not state PT Goal Formulation: With patient Time For Goal Achievement: 10/22/20 Potential to Achieve Goals: Good    Frequency Min 3X/week   Barriers to discharge        Co-evaluation               AM-PAC PT "6 Clicks" Mobility  Outcome Measure Help needed turning from your back to your side while in a flat bed without using bedrails?: None Help needed moving from lying on your back to sitting on the side of a flat bed without using bedrails?: None Help needed moving to and from a bed to a chair (including a wheelchair)?: A Little Help needed standing up from a chair using your arms (e.g., wheelchair or bedside chair)?: A Little Help needed to walk in hospital room?: A Little Help needed climbing 3-5 steps with a railing? : A Lot 6 Click Score: 19    End of Session   Activity Tolerance: Patient tolerated treatment well Patient left: in chair;with call bell/phone within reach;with chair alarm set Nurse Communication: Mobility status PT Visit Diagnosis: Unsteadiness on feet (R26.81);Difficulty in walking, not elsewhere classified (R26.2)    Time: 8144-8185 PT Time Calculation (min) (ACUTE ONLY): 45 min   Charges:   PT Evaluation $PT Eval Moderate Complexity: 1 Mod PT Treatments $Therapeutic Activity: 23-37 mins        Lillia Pauls, PT, DPT Acute Rehabilitation Services Pager [phone] Office [phone]   Christian [name] 10/08/2020, 5:13 PM

## 2020-10-08 NOTE — Progress Notes (Signed)
Carotid artery duplex completed. Refer to "CV Proc" under chart review to view preliminary results.  10/08/2020 4:25 PM Eula Fried., MHA, RVT, RDCS, RDMS

## 2020-10-08 NOTE — Progress Notes (Addendum)
STROKE TEAM PROGRESS NOTE   INTERVAL HISTORY I personally reviewed history of presenting illness, electronic medical records and imaging films in PACS.  Presented with 3-day history of difficulties speaking with trouble completing sentences with some fluctuation.  MRI scan of the brain is negative for acute stroke.  MRI of the brain shows diminished flow in the right vertebral artery.  Vitals:   10/08/20 0040 10/08/20 0240 10/08/20 0440 10/08/20 0640  BP: 128/75 110/73 128/64 123/72  Pulse: 90 84 89   Resp: Temp: 98.8 F (37.1 C) 98.5 F (36.9 C) 98.5 F (36.9 C) 98.7 F (37.1 C)  TempSrc: Oral Oral Oral Oral  SpO2: 94% 96% 93%   Weight:      Height:       CBC:  Recent Labs  Lab 10/07/20 1534  WBC 5.9  NEUTROABS 4.6  HGB 12.3*  HCT 42.6  MCV 78.0*  PLT 217   Basic Metabolic Panel:  Recent Labs  Lab 10/07/20 1534 10/08/20 0353  NA 135 134*  K 5.2* 4.6  CL 99 102  CO2 26 24  GLUCOSE 228* 150*  BUN 63* 54*  CREATININE 2.31* 1.92*  CALCIUM 10.0 9.4   Lipid Panel:  Recent Labs  Lab 10/08/20 0353  CHOL 128  TRIG 72  HDL 33*  CHOLHDL 3.9  VLDL 14  LDLCALC 81   HgbA1c:  Recent Labs  Lab 10/08/20 0353  HGBA1C 11.1*   Urine Drug Screen:  Recent Labs  Lab 10/07/20 1728  LABOPIA NONE DETECTED  COCAINSCRNUR NONE DETECTED  LABBENZ NONE DETECTED  AMPHETMU NONE DETECTED  THCU NONE DETECTED  LABBARB NONE DETECTED    Alcohol Level  Recent Labs  Lab 10/07/20 1534  ETH <10    IMAGING past 24 hours CT HEAD WO CONTRAST  Result Date: 10/07/2020 CLINICAL DATA:  Neurologic deficit EXAM: CT HEAD WITHOUT CONTRAST TECHNIQUE: Contiguous axial images were obtained from the base of the skull through the vertex without intravenous contrast. COMPARISON:  08/22/2018 FINDINGS: Brain: No acute infarct or hemorrhage. Lateral ventricles and midline structures are unremarkable. No acute extra-axial fluid collections. No mass effect. Vascular: No hyperdense  vessel. There is severe atherosclerosis of the bilateral internal carotid arteries, stable. Skull: Normal. Negative for fracture or focal lesion. Sinuses/Orbits: Minimal fluid within the right mastoid air cells. Paranasal sinuses are clear. Other: None. IMPRESSION: 1. No acute intrathoracic process. 2. Minimal fluid within the right mastoid air cells. 3. Stable atherosclerosis. Electronically Signed   By: Sharlet Salina M.D.   On: 10/07/2020 17:46   MR ANGIO HEAD WO CONTRAST  Result Date: 10/07/2020 CLINICAL DATA:  69 year old male with altered mental status, neurologic deficit, TIA. EXAM: MRA HEAD WITHOUT CONTRAST TECHNIQUE: Angiographic images of the Circle of Willis were obtained using MRA technique without intravenous contrast. COMPARISON:  Brain MRI today.  Intracranial MRA 03/07/2011. FINDINGS: Severe stenosis of the diminutive distal right vertebral artery demonstrated in 2012, now with essentially absent flow signal in the right V4 segment. The distal left vertebral artery remains patent. Visible left PICA remains patent. The left vertebral supplies the basilar with moderate to severe increased stenosis at the left vertebrobasilar junction (series 1052, image 11. There is minimal reconstitution of the right vertebrobasilar junction. Patent basilar artery with chronic mild irregularity and stenosis. SCA and left PCA origins remain patent. Fetal right PCA origin again noted. Diminutive or absent left posterior communicating artery. Moderate to severe bilateral PCA P2 segment stenoses are chronic (image 1046  image 18 and 19) and not significantly changed. Preserved distal PCA flow signal. Antegrade flow in both ICA siphons. Severe bilateral distal ICA irregularity and stenosis, on the left just proximal to the ICA terminus (series 1040, image 7) and on the right at the anterior genu (same image). These have substantially progressed since 2012. Carotid termini remain patent. Mild to moderate right ACA origin  stenosis is increased. Anterior communicating artery and visible ACA branches are otherwise within normal limits. Left MCA M1 mild irregularity and stenosis has not significantly changed. Left MCA trifurcation remains patent. Visible left MCA branches are stable. Moderate chronic right MCA M1 stenosis is stable (series 10 40 image 5). Mild to moderate stenosis also at the right MCA bifurcation is stable (image 13). Visible right MCA branches appear stable. IMPRESSION: 1. Chronically advanced intracranial atherosclerosis. 2. The following have progressed since a 2012 MRA: - occlusion of the non-dominant distal Right Vertebral Artery. - Moderate to Severe stenosis at the Left Vertebrobasilar Junction. - Severe stenosis of both ICA siphons (right anterior genu and left supraclinoid segment). - mild to moderate Right ACA origin stenosis. 3. The following are stable since 2012: - Moderate Right MCA M1 stenosis. - Moderate to Severe bilateral PCA P2 stenoses. Electronically Signed   By: Odessa Fleming M.D.   On: 10/07/2020 19:34   MR BRAIN WO CONTRAST  Result Date: 10/07/2020 CLINICAL DATA:  69 year old male with altered mental status, neurologic deficit, TIA. EXAM: MRI HEAD WITHOUT CONTRAST TECHNIQUE: Multiplanar, multiecho pulse sequences of the brain and surrounding structures were obtained without intravenous contrast. COMPARISON:  Head CT earlier today.  Brain MRI 03/07/2011. FINDINGS: Brain: No restricted diffusion to suggest acute infarction. No midline shift, mass effect, evidence of mass lesion, ventriculomegaly, extra-axial collection or acute intracranial hemorrhage. Cervicomedullary junction and pituitary are within normal limits. Mild for age vague, patchy cerebral white matter T2 and FLAIR hyperintensity is stable. No cortical encephalomalacia identified. Chronic lacunar infarct medial left pons is subtle and was better visualized in 2012. No chronic cerebral blood products identified. Deep gray matter nuclei  and cerebellum remain within normal limits. Vascular: Major intracranial vascular flow voids appear stable since 2012 including decreased distal right vertebral artery flow void (series 15, image 3 today). Generalized intracranial artery tortuosity again noted. Skull and upper cervical spine: Visible bone marrow signal and cervical spine appear stable since 2012. Sinuses/Orbits: Stable, negative.  Hyperplastic paranasal sinuses. Other: Mild chronic mastoid effusions, regressed on the left but increased on the right since 2012. Still, significance is doubtful. Other visible internal auditory structures appear normal. Visible scalp and face appear negative. IMPRESSION: 1. No acute intracranial abnormality. 2. Mild for age chronic small vessel disease, not significantly changed since 2012. 3. Evidence of chronically decreased flow in the distal right vertebral artery. See also MRA today reported separately. Electronically Signed   By: Odessa Fleming M.D.   On: 10/07/2020 19:25   US RENAL  Result Date: 10/07/2020 CLINICAL DATA:  Acute kidney injury EXAM: RENAL / URINARY TRACT ULTRASOUND COMPLETE COMPARISON:  Renal ultrasound 07/31/2014, CT abdomen pelvis 08/01/2014 FINDINGS: Right Kidney: Renal measurements: 11.2 x 5.4 x 4.4 cm = volume: 139 mL. Echogenicity is within normal limits. No concerning renal mass, shadowing calculus or hydronephrosis. Left Kidney: Renal measurements: 11.9 x 6.1 x 5.8 cm = volume: 219 mL. Echogenicity is within normal limits. No concerning renal mass, shadowing calculus or hydronephrosis. Bladder: Moderate bladder distention without wall thickening, calculi or debris. Other: None. IMPRESSION: Moderate bladder distension without  other concerning bladder abnormality. Otherwise unremarkable renal ultrasound. Electronically Signed   By: Kreg Shropshire M.D.   On: 10/07/2020 19:35   DG Chest Portable 1 View  Result Date: 10/07/2020 CLINICAL DATA:  Slurred speech EXAM: PORTABLE CHEST 1 VIEW  COMPARISON:  Mar 31, 2020 FINDINGS: No edema or airspace opacity. Heart is slightly enlarged with pulmonary vascularity normal. No adenopathy. No bone lesions. IMPRESSION: Heart slightly enlarged.  No edema or airspace opacity. Electronically Signed   By: Bretta Bang III M.D.   On: 10/07/2020 15:01   ECHOCARDIOGRAM COMPLETE  Result Date: 10/08/2020    ECHOCARDIOGRAM REPORT   Patient Name:   Christian [name] Date of Exam: 10/08/2020 Medical Rec #:  [id number]        Height:       72.0 in Accession #:    [id number]       Weight:       281.0 lb Date of Birth:  Mar 08, 1951        BSA:          2.462 m Patient Age:    56 years         BP:           123/72 mmHg Patient Gender: M                HR:           86 bpm. Exam Location:  Inpatient Procedure: 2D Echo and Intracardiac Opacification Agent Indications:    TIA 435.9 / G45.9  History:        Patient has prior history of Echocardiogram examinations, most                 recent 03/18/2020. Risk Factors:Hypertension, Diabetes and Sleep                 Apnea. Chronic kidney disease.  Sonographer:    Leta Jungling RDCS Referring Phys: [id number] [facility] General  Sonographer Comments: Technically difficult study due to poor echo windows. IMPRESSIONS  1. Left ventricular ejection fraction, by estimation, is 65 to 70%. The left ventricle has normal function. The left ventricle has no regional wall motion abnormalities. There is mild left ventricular hypertrophy. Left ventricular diastolic parameters are consistent with Grade I diastolic dysfunction (impaired relaxation).  2. Right ventricular systolic function is normal. The right ventricular size is normal.  3. The mitral valve is normal in structure. No evidence of mitral valve regurgitation. No evidence of mitral stenosis. Severe mitral annular calcification.  4. The aortic valve is tricuspid. There is moderate calcification of the aortic valve. There is moderate thickening of the aortic valve. Aortic valve regurgitation  is not visualized. Mild to moderate aortic valve sclerosis/calcification is present, without any evidence of aortic stenosis.  5. The inferior vena cava is normal in size with greater than 50% respiratory variability, suggesting right atrial pressure of 3 mmHg. Conclusion(s)/Recommendation(s): No intracardiac source of embolism detected on this transthoracic study. A transesophageal echocardiogram is recommended to exclude cardiac source of embolism if clinically indicated. FINDINGS  Left Ventricle: Left ventricular ejection fraction, by estimation, is 65 to 70%. The left ventricle has normal function. The left ventricle has no regional wall motion abnormalities. Definity contrast agent was given IV to delineate the left ventricular  endocardial borders. The left ventricular internal cavity size was normal in size. There is mild left ventricular hypertrophy. Left ventricular diastolic parameters are consistent with Grade I diastolic dysfunction (impaired relaxation). Right Ventricle: The  right ventricular size is normal. No increase in right ventricular wall thickness. Right ventricular systolic function is normal. Left Atrium: Left atrial size was normal in size. Right Atrium: Right atrial size was normal in size. Pericardium: There is no evidence of pericardial effusion. Mitral Valve: The mitral valve is normal in structure. There is moderate thickening of the mitral valve leaflet(s). There is moderate calcification of the mitral valve leaflet(s). Severe mitral annular calcification. No evidence of mitral valve regurgitation. No evidence of mitral valve stenosis. Tricuspid Valve: The tricuspid valve is normal in structure. Tricuspid valve regurgitation is not demonstrated. No evidence of tricuspid stenosis. Aortic Valve: The aortic valve is tricuspid. There is moderate calcification of the aortic valve. There is moderate thickening of the aortic valve. Aortic valve regurgitation is not visualized. Mild to moderate  aortic valve sclerosis/calcification is present, without any evidence of aortic stenosis. Pulmonic Valve: The pulmonic valve was normal in structure. Pulmonic valve regurgitation is not visualized. No evidence of pulmonic stenosis. Aorta: The aortic root is normal in size and structure. Venous: The inferior vena cava is normal in size with greater than 50% respiratory variability, suggesting right atrial pressure of 3 mmHg. IAS/Shunts: No atrial level shunt detected by color flow Doppler.  LEFT VENTRICLE PLAX 2D LVIDd:         4.20 cm      Diastology LVIDs:         3.05 cm      LV e' medial:    5.00 cm/s LV PW:         1.00 cm      LV E/e' medial:  12.9 LV IVS:        1.20 cm      LV e' lateral:   6.09 cm/s LVOT diam:     2.05 cm      LV E/e' lateral: 10.6 LV SV:         50 LV SV Index:   20 LVOT Area:     3.30 cm  LV Volumes (MOD) LV vol d, MOD A2C: 78.6 ml LV vol d, MOD A4C: 172.0 ml LV vol s, MOD A2C: 31.1 ml LV vol s, MOD A4C: 46.9 ml LV SV MOD A2C:     47.5 ml LV SV MOD A4C:     172.0 ml LV SV MOD BP:      82.0 ml RIGHT VENTRICLE TAPSE (M-mode): 2.3 cm LEFT ATRIUM             Index       RIGHT ATRIUM           Index LA diam:        3.50 cm 1.42 cm/m  RA Area:     14.60 cm LA Vol (A2C):   43.0 ml 17.47 ml/m RA Volume:   30.30 ml  12.31 ml/m LA Vol (A4C):   42.3 ml 17.18 ml/m LA Biplane Vol: 44.1 ml 17.91 ml/m  AORTIC VALVE LVOT Vmax:   91.60 cm/s LVOT Vmean:  63.800 cm/s LVOT VTI:    0.150 m  AORTA Ao Root diam: 3.30 cm Ao Asc diam:  3.40 cm MITRAL VALVE MV Area (PHT): 4.54 cm    SHUNTS MV Decel Time: 167 msec    Systemic VTI:  0.15 m MV E velocity: 64.50 cm/s  Systemic Diam: 2.05 cm MV A velocity: 90.30 cm/s MV E/A ratio:  0.71 Donato Schultz MD Electronically signed by Donato Schultz MD Signature Date/Time: 10/08/2020/11:31:40 AM    Final  PHYSICAL EXAM Obese middle-aged Caucasian male not in distress. . Afebrile. Head is nontraumatic. Neck is supple without bruit.    Cardiac exam no murmur or gallop.  Lungs are clear to auscultation. Distal pulses are well felt. He has bilateral asterixis of both wrist and action tremor Neurological Exam : He is awake alert and interactive.  Speech is slurred with some word finding difficulties and trouble completing sentences.  Follows simple midline and one-step commands.  Extraocular movements are full range without nystagmus.  Blinks to threat bilaterally.  Face is symmetric without weakness.  Tongue midline.  Motor system exam shows symmetric upper and lower extremity strength without focal weakness.  Deep tendon flexes symmetric.  Plantars are downgoing.  Sensation appears intact.  Gait not tested. ASSESSMENT/PLAN Christian [name] is a 69 y.o. male with history of BL lower extremity edema, Bipolar 1 disorder, CKD, gout, HTN, HLD, DM2, atrial flutter diagnosed in May 2021 and on Xarelto, PCI with 2 stents in June 2021 and started on plavix presenting with aphasia.   Encephalopathy - doubt TIA, no stroke  CT head No acute abnormality. R mastoid fluid. Atherosclerosis   MRI  No acute abnormality. Small vessel disease stable. Distal R VA decreased flow.   MRA advanced atherosclerosis. Progressed since 2012 - Distal R VA occlusion, moderate to severe L VBJ, severe B ICA siphons, mid to moderate R ACA origin stenoses. Stable since 2012 - moderate R M1, moderate to severe B P2 stenoses  Carotid Doppler  Pending   2D Echo EF 65-70%. No source of embolus   LDL 81  HgbA1c 11.1  VTE prophylaxis - Xarelto  clopidogrel 75 mg daily and Xarelto (rivaroxaban) daily prior to admission, now on clopidogrel 75 mg daily and Xarelto (rivaroxaban) daily.   Therapy recommendations:  pending   Disposition:  pending   Atrial Fibrillation  Home anticoagulation:  Xarelto (rivaroxaban) daily continued in the hospital . Continue Xarelto (rivaroxaban) daily at discharge   Hypertension  Stable . BP goal normotensive  Hyperlipidemia  Home meds:  lipitor  10  Now on lipitor 80  LDL 81, goal < 70  Continue statin at discharge  Diabetes type II Uncontrolled  HgbA1c 11.1, goal < 7.0  Follow up PCP  Other Stroke Risk Factors  Advanced Age >/= 69   Obesity, Body mass index is 38.11 kg/m., BMI >/= 30 associated with increased stroke risk, recommend weight loss, diet and exercise as appropriate   Coronary artery disease s/p PCI 03/2020 on plavix  Obstructive sleep apnea, on CPAP at home  Other Active Problems  CKD stage IIIa  biopolar d/o  Hospital day # 1  Patient presented with subacute speech and word finding difficulties of unclear etiology MRI is negative for acute stroke.  Exam shows evidence of asterixis and tremors which suggest encephalopathy.  Recommend check EEG, ammonia since he is on Depakote.  Repeat CT head tomorrow morning discussed with Dr. Jerral Ralph.  Await speech therapy evaluation greater than 50% time during this 35-minute visit was spent on counseling and coordination of care about his speech difficulties and encephalopathy and answering questions Delia Heady, MD To contact Stroke Continuity provider, please refer to [email or web address]. After hours, contact General Neurology

## 2020-10-08 NOTE — Evaluation (Signed)
Speech Language Pathology Evaluation Patient Details Name: Christian [name] MRN: [id number] DOB: 1951/07/28 Today's Date: 10/08/2020 Time: 9833-8250 SLP Time Calculation (min) (ACUTE ONLY): 12 min  Problem List:  Patient Active Problem List   Diagnosis Date Noted  . CVA (cerebral vascular accident) (HCC) 10/07/2020  . Type 2 diabetes mellitus with stage 3 chronic kidney disease (HCC) 04/01/2020  . CAD (coronary artery disease), native coronary artery 04/01/2020  . NSTEMI (non-ST elevated myocardial infarction) (HCC) 03/31/2020  . Obesity, Class III, BMI 40-49.9 (morbid obesity) (HCC) 03/31/2020  . Aspiration pneumonia of both lower lobes due to gastric secretions (HCC) 03/21/2020  . Sacral decubitus ulcer, stage II (HCC) 03/19/2020  . Chronic diastolic CHF (congestive heart failure) (HCC) 03/17/2020  . Atrial flutter, paroxysmal (HCC) 12/27/2019  . Constipation 09/16/2018  . Bipolar 1 disorder, depressed (HCC) 08/22/2018  . Encephalopathy   . Paroxysmal atrial fibrillation (HCC) 06/18/2018  . History of pulmonary embolism 06/17/2018  . Generalized weakness 03/27/2018  . Diabetes mellitus type 2 in nonobese (HCC) 03/27/2018  . Pulmonary embolism (HCC) 03/27/2018  . Fall   . Laceration of eyebrow   . Bipolar I disorder, most recent episode depressed, severe w psychosis (HCC) 12/22/2015  . Bipolar affective disorder, depressed, severe, with psychotic behavior (HCC)   . Bipolar I disorder, current or most recent episode depressed, with psychotic features (HCC)   . Severe bipolar I disorder with depression (HCC)   . Bipolar I disorder, most recent episode depressed, severe with psychotic features (HCC)   . Overdose 09/11/2015  . Acute respiratory failure with hypoxia (HCC) 09/05/2015  . Elevated troponin 09/05/2015  . Somnolence 09/05/2015  . Bipolar depression (HCC) 09/05/2015  . Acute bilateral deep vein thrombosis (DVT) of femoral veins (HCC) 09/05/2015  . Bilateral pulmonary  embolism (HCC) 09/02/2015  . Labile hypertension   . Pulmonary embolism with acute cor pulmonale (HCC)   . Dyspnea   . Orthostatic hypotension   . Bipolar disorder, in partial remission, most recent episode depressed (HCC)   . Syncope, near 08/31/2015  . Affective psychosis, bipolar (HCC)   . Bipolar affective disorder, depressed, mild (HCC) 08/28/2015  . Pressure ulcer 07/30/2015  . Protein-calorie malnutrition, severe (HCC) 07/30/2015  . Dehydration 07/29/2015  . Diabetes (HCC) 07/09/2015  . UTI (urinary tract infection) 06/19/2014  . Positive blood culture 06/19/2014  . Type 2 diabetes mellitus with complication, without long-term current use of insulin (HCC) 06/02/2014  . Other and unspecified hyperlipidemia 06/02/2014  . OSA on CPAP 06/02/2014  . Bilateral lower extremity edema: chronic with venous stasis changes 06/02/2014  . Gout 06/02/2014  . Hypertension    Past Medical History:  Past Medical History:  Diagnosis Date  . Bilateral lower extremity edema: chronic with venous stasis changes 06/02/2014  . Bipolar 1 disorder (HCC)   . Bipolar disorder (HCC)   . Chronic kidney disease   . Gout   . Gout   . Hx of blood clots   . Hypertension   . Mood swings   . OSA on CPAP 06/02/2014  . Other and unspecified hyperlipidemia 06/02/2014  . Type II or unspecified type diabetes mellitus with unspecified complication, uncontrolled 06/02/2014   Past Surgical History:  Past Surgical History:  Procedure Laterality Date  . LEFT HEART CATH AND CORONARY ANGIOGRAPHY Left 04/01/2020   Procedure: LEFT HEART CATH AND CORONARY ANGIOGRAPHY;  Surgeon: Laurier Nancy, MD;  Location: [facility] INVASIVE CV LAB;  Service: Cardiovascular;  Laterality: Left;   HPI:  Christian Peek  [name] is a 69 y.o. male with medical history significant of CAD with PCI and stenting x2, paroxysmal A. fib on Xarelto, IDDM, HLD, HTN, OSA on CPAP, PAD, CKD stage III, bipolar disorder. Presented to PCP with left-sided facial droop,  right-sided weakness, and slurred speech. Sent to ED. MRI head showed no acute findings. 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 Severe stenosis of both ICA siphons with mild to moderate R ACA origin stenosis. 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   Assessment / Plan / Recommendation Clinical Impression  Pt was seen for SLE and was cooperative but lethargic. He was oriented to person and situation.  His overall verbal expression was fluent but he demonstrated mild initiation impairments. His verbal expression also contained phonemic and semantic paraphasias. Although auditory comprehension of sequential commands was [facility], his comprehension of yes/no questions was impaired. Mild naming deficits were also noted during a confrontational naming task. SLP will continue to f/u acutely to address verbal expression and auditory comprehension.    SLP Assessment  SLP Recommendation/Assessment: Patient needs continued Speech Lanaguage Pathology Services SLP Visit Diagnosis: Aphasia (R47.01)    Follow Up Recommendations       Frequency and Duration min 2x/week  2 weeks      SLP Evaluation Cognition  Overall Cognitive Status: Impaired/Different from baseline Arousal/Alertness: Lethargic Orientation Level: Oriented to person;Oriented to situation Attention: Focused;Sustained;Selective Focused Attention: Appears intact Sustained Attention: Appears intact Selective Attention: Impaired Awareness: Impaired Awareness Impairment: Emergent impairment;Intellectual impairment       Comprehension  Auditory Comprehension Overall Auditory Comprehension: Impaired Yes/No Questions: Impaired Commands: Within Functional Limits Conversation: Simple    Expression Expression Primary Mode of Expression: Verbal Verbal Expression Overall Verbal  Expression: Impaired Initiation: Impaired Automatic Speech: Name;Social Response Level of Generative/Spontaneous Verbalization: Sentence Repetition: No impairment Naming: Impairment Confrontation: Impaired Verbal Errors: Semantic paraphasias;Phonemic paraphasias   Oral / Motor  Oral Motor/Sensory Function Overall Oral Motor/Sensory Function: Within functional limits Motor Speech Overall Motor Speech: Appears within functional limits for tasks assessed Respiration: Within functional limits Phonation: Normal Resonance: Within functional limits Articulation: Within functional limitis Intelligibility: Intelligible Motor Planning: Not tested Motor Speech Errors: Not applicable   GO                    Royetta Crochet 10/08/2020, 1:20 PM

## 2020-10-08 NOTE — Plan of Care (Signed)
  Problem: Education: Goal: Knowledge of General Education information will improve Description: Including pain rating scale, medication(s)/side effects and non-pharmacologic comfort measures Outcome: Progressing   Problem: Health Behavior/Discharge Planning: Goal: Ability to manage health-related needs will improve Outcome: Progressing   Problem: Clinical Measurements: Goal: Ability to maintain clinical measurements within normal limits will improve Outcome: Progressing Goal: Will remain free from infection Outcome: Progressing Goal: Diagnostic test results will improve Outcome: Progressing Goal: Respiratory complications will improve Outcome: Progressing Goal: Cardiovascular complication will be avoided Outcome: Progressing   Problem: Activity: Goal: Risk for activity intolerance will decrease Outcome: Progressing   Problem: Nutrition: Goal: Adequate nutrition will be maintained Outcome: Progressing   Problem: Coping: Goal: Level of anxiety will decrease Outcome: Progressing   Problem: Elimination: Goal: Will not experience complications related to bowel motility Outcome: Progressing Goal: Will not experience complications related to urinary retention Outcome: Progressing   Problem: Pain Managment: Goal: General experience of comfort will improve Outcome: Progressing   Problem: Safety: Goal: Ability to remain free from injury will improve Outcome: Progressing   Problem: Skin Integrity: Goal: Risk for impaired skin integrity will decrease Outcome: Progressing   Problem: Education: Goal: Knowledge of disease or condition will improve Outcome: Progressing Goal: Knowledge of secondary prevention will improve Outcome: Progressing Goal: Knowledge of patient specific risk factors addressed and post discharge goals established will improve Outcome: Progressing Goal: Individualized Educational Video(s) Outcome: Progressing   Problem: Coping: Goal: Will verbalize  positive feelings about self Outcome: Progressing Goal: Will identify appropriate support needs Outcome: Progressing   Problem: Health Behavior/Discharge Planning: Goal: Ability to manage health-related needs will improve Outcome: Progressing   Problem: Self-Care: Goal: Ability to participate in self-care as condition permits will improve Outcome: Progressing Goal: Verbalization of feelings and concerns over difficulty with self-care will improve Outcome: Progressing Goal: Ability to communicate needs accurately will improve Outcome: Progressing   Problem: Nutrition: Goal: Risk of aspiration will decrease Outcome: Progressing Goal: Dietary intake will improve Outcome: Progressing   Problem: Ischemic Stroke/TIA Tissue Perfusion: Goal: Complications of ischemic stroke/TIA will be minimized Outcome: Progressing   

## 2020-10-08 NOTE — Progress Notes (Signed)
EEG complete - results pending 

## 2020-10-08 NOTE — Plan of Care (Signed)
Plan of care and patient goals need to be reinforced daily due to the patients current medical condition along with expressive/receptive asphasia. Patient has some difficulty understanding.  Problem: Education: Goal: Knowledge of General Education information will improve Description: Including pain rating scale, medication(s)/side effects and non-pharmacologic comfort measures Outcome: Progressing   Problem: Health Behavior/Discharge Planning: Goal: Ability to manage health-related needs will improve Outcome: Progressing   Problem: Clinical Measurements: Goal: Ability to maintain clinical measurements within normal limits will improve Outcome: Progressing Goal: Will remain free from infection Outcome: Progressing Goal: Diagnostic test results will improve Outcome: Progressing Goal: Respiratory complications will improve Outcome: Progressing Goal: Cardiovascular complication will be avoided Outcome: Progressing   Problem: Activity: Goal: Risk for activity intolerance will decrease Outcome: Progressing   Problem: Nutrition: Goal: Adequate nutrition will be maintained Outcome: Progressing   Problem: Coping: Goal: Level of anxiety will decrease Outcome: Progressing   Problem: Elimination: Goal: Will not experience complications related to bowel motility Outcome: Progressing Goal: Will not experience complications related to urinary retention Outcome: Progressing   Problem: Pain Managment: Goal: General experience of comfort will improve Outcome: Progressing   Problem: Safety: Goal: Ability to remain free from injury will improve Outcome: Progressing   Problem: Skin Integrity: Goal: Risk for impaired skin integrity will decrease Outcome: Progressing   Problem: Education: Goal: Knowledge of disease or condition will improve Outcome: Progressing Goal: Knowledge of secondary prevention will improve Outcome: Progressing Goal: Knowledge of patient specific risk factors  addressed and post discharge goals established will improve Outcome: Progressing Goal: Individualized Educational Video(s) Outcome: Progressing   Problem: Coping: Goal: Will verbalize positive feelings about self Outcome: Progressing Goal: Will identify appropriate support needs Outcome: Progressing   Problem: Health Behavior/Discharge Planning: Goal: Ability to manage health-related needs will improve Outcome: Progressing   Problem: Self-Care: Goal: Ability to participate in self-care as condition permits will improve Outcome: Progressing Goal: Verbalization of feelings and concerns over difficulty with self-care will improve Outcome: Progressing Goal: Ability to communicate needs accurately will improve Outcome: Progressing   Problem: Nutrition: Goal: Risk of aspiration will decrease Outcome: Progressing Goal: Dietary intake will improve Outcome: Progressing   Problem: Ischemic Stroke/TIA Tissue Perfusion: Goal: Complications of ischemic stroke/TIA will be minimized Outcome: Progressing

## 2020-10-08 NOTE — Progress Notes (Signed)
  Echocardiogram 2D Echocardiogram with definity has been performed.  Leta Jungling M 10/08/2020, 10:28 AM

## 2020-10-08 NOTE — Progress Notes (Signed)
RT note. Pt. Refusing CPAP at this time. VS stable on rm air. Told pt. To let RN know if he decided to change his mind.

## 2020-10-08 NOTE — Progress Notes (Addendum)
Inpatient Diabetes Program Recommendations  AACE/ADA: New Consensus Statement on Inpatient Glycemic Control (2015)  Target Ranges:  Prepandial:   less than 140 mg/dL      Peak postprandial:   less than 180 mg/dL (1-2 hours)      Critically ill patients:  140 - 180 mg/dL   Results for Christian [name], Christian [name] (MRN [id number]) as of 10/08/2020 07:16  Ref. Range 10/07/2020 22:50 10/08/2020 06:10  Glucose-Capillary Latest Ref Range: 70 - 99 mg/dL 214 (H)  2 units NOVOLOG  115 (H)   Results for Christian [name], Christian [name] (MRN [id number]) as of 10/08/2020 07:16  Ref. Range 01/30/2020 09:25 03/31/2020 12:33 10/08/2020 03:53  Hemoglobin A1C Latest Ref Range: 4.8 - 5.6 % 7.1 (H) 8.7 (H) 11.1 (H)  (271 mg/dl)   Admit with: CVA/ Aphasia  History: DM, CKD, Bipolar disorder  Home DM Meds: Jardiance 10 mg Daily       Lantus 6-7 units QAM  Current Orders: Novolog Moderate Correction Scale/ SSI (0-15 units) TID AC + HS     Lives by himself in independent living--daughter and sister visit daily  PCP: Dr. Nancy Fetter with [facility]    MD- Note significant rise in pt's A1c from May of this year  Needs follow up with PCP for better CBG control after discharge  Note that Novolog SSI started last PM--CBG down to 115 this AM    Addendum 11:45am--Met w/ pt at bedside.  Pt lying in bed and alert and oriented.  Having issues with expressive and receptive aphasia so conversation was limited.  Pt told me he lives by himself and takes his own meds.  Was able to verify that he uses Lantus insulin at home.  Stated he has a CBG meter at home.  We discussed that his A1c was elevated to 11.1% and why this was concerning.  Pt not able to think of any outlying factors that may have led to such an elevated A1c.    Called pt's Daughter [name].  Daughter told me that pt suffered a heart attack in June and that he has been involved with Cardiac Rehab since then.  Daughter told me that pt's PCP Dr. Nancy Fetter with [facility] had pt reduce his  Lantus to 6-7 units Daily (was taking 30 units daily).  Daughter told me that pt was using a scale for the Lantus when he was taking the larger dose but that Dr. Nancy Fetter wanted to simplify his regimen and reduce the dose to 6-7 units Daily.  Daughter told me that when they check pt's CBGs they are usually in the 130-150 range.  When pt began having stroke-like symptoms this past weekend, daughter stated that his CBGs were in the 300s at that point.  Daughter notes pt's activity has decreased at his independent living facility as there was an incidence of COVID and all activities were stopped.  Pt also stated that pt used to be on a meal program similar to meals on wheels but that program is also on hold due to [facility].  Per daughter, she and her aunt provide most food to pt.  Discussed w/ daughter that given pt is having stroke-like symptoms, CBG control will be very important both in the hospital and at home after discharge to prevent further vascular events.  Explained to daughter that we will be checking pt's CBGs frequently in the hospital and adjusting insulin as needed.  Also explained to daughter that pt may need intensification of his home DM regimen for home.  Daughter appreciative of all info.  Did have some questions about his neuro plans of care.  Asked daughter to speak w/ pt's primary RN regarding these questions.    --Will follow patient during hospitalization--  Wyn Quaker RN, MSN, CDE Diabetes Coordinator Inpatient Glycemic Control Team Team Pager: [phone] (8a-5p)

## 2020-10-08 NOTE — Discharge Instructions (Signed)

## 2020-10-09 ENCOUNTER — Other Ambulatory Visit: Payer: Self-pay

## 2020-10-09 ENCOUNTER — Inpatient Hospital Stay (HOSPITAL_COMMUNITY): Payer: Medicare PPO

## 2020-10-09 ENCOUNTER — Encounter (HOSPITAL_COMMUNITY): Payer: Medicare PPO

## 2020-10-09 ENCOUNTER — Encounter: Payer: Medicare PPO | Admitting: Vascular Surgery

## 2020-10-09 DIAGNOSIS — R4182 Altered mental status, unspecified: Secondary | ICD-10-CM

## 2020-10-09 LAB — BASIC METABOLIC PANEL
Anion gap: 10 (ref 5–15)
BUN: 48 mg/dL — ABNORMAL HIGH (ref 8–23)
CO2: 22 mmol/L (ref 22–32)
Calcium: 9.2 mg/dL (ref 8.9–10.3)
Chloride: 105 mmol/L (ref 98–111)
Creatinine, Ser: 1.98 mg/dL — ABNORMAL HIGH (ref 0.61–1.24)
GFR, Estimated: 36 mL/min — ABNORMAL LOW (ref >60)
Glucose, Bld: 113 mg/dL — ABNORMAL HIGH (ref 70–99)
Potassium: 4.7 mmol/L (ref 3.5–5.1)
Sodium: 137 mmol/L (ref 135–145)

## 2020-10-09 LAB — GLUCOSE, CAPILLARY
Glucose-Capillary: 108 mg/dL — ABNORMAL HIGH (ref 70–99)
Glucose-Capillary: 120 mg/dL — ABNORMAL HIGH (ref 70–99)
Glucose-Capillary: 158 mg/dL — ABNORMAL HIGH (ref 70–99)
Glucose-Capillary: 134 mg/dL — ABNORMAL HIGH (ref 70–99)

## 2020-10-09 MED ORDER — RIVAROXABAN 20 MG PO TABS
20.0000 mg | ORAL_TABLET | Freq: Every day | ORAL | Status: DC
  Administered 2020-10-10 – 2020-10-16 (×7): 20 mg via ORAL
  Filled 2020-10-09 (×7): qty 1

## 2020-10-09 MED ORDER — CLOPIDOGREL BISULFATE 75 MG PO TABS
75.0000 mg | ORAL_TABLET | Freq: Every day | ORAL | Status: DC
  Administered 2020-10-10 – 2020-10-16 (×7): 75 mg via ORAL
  Filled 2020-10-09 (×7): qty 1

## 2020-10-09 NOTE — Progress Notes (Signed)
Name: Christian [name] DOB: 1951/07/25  Please be advised that the above-named patient will require a short-term nursing home stay -- anticipated 30 days or less for rehabilitation and strengthening. The plan is for return home.

## 2020-10-09 NOTE — Progress Notes (Signed)
PROGRESS NOTE        PATIENT DETAILS Name: Araf Clugston Bonnet Age: 69 y.o. Sex: male Date of Birth: 1951/06/12 Admit Date: 10/07/2020 Admitting Physician Emeline General, MD PCP:Sun, Charise Carwin, MD  Brief Narrative: Patient is a 69 y.o. male CAD s/p PCI, PAF on Xarelto, DM-2, HTN, HLD, CKD stage IIIa, bipolar disorder, OSA on CPAP-presented to the ED after he had difficulty formulating sentences.  He was subsequently admitted for further evaluation and treatment.  Significant events: 11/9>> admit with aphasia  Significant studies: 11/9>> chest x-ray: No pneumonia. 11/9>> CT head: No acute intracranial process 11/9>> MRI brain: No acute intracranial abnormality. 11/9>> MRI brain: Chronically advanced intracranial atherosclerosis. 11/10>> Echo: EF 65-70%, grade 1 diastolic dysfunction. 11/10>> carotid Doppler: No significant stenosis 11/10>> LDL: 81 11/10>> A1c 11.1 11/11>> CT head: No evidence of acute intracranial abnormality.  Antimicrobial therapy: None  Microbiology data: None  Procedures : 11/10>> EEG: Pending  Consults: Neurology  DVT Prophylaxis : rivaroxaban (XARELTO) tablet 20 mg    Subjective: Much better-he is awake/alert-speech is significantly improved-he was able to tell me his daughter's name-and able to fluently answer most of my questions.  Assessment/Plan: Aphasia: Speech has significantly improved overnight-MRI brain on 11/9 - for CVA-CT head repeated on 11/11 morning-which does not show any acute abnormalities.  Suspect in retrospect-that this probably was more from encephalopathy from AKI.  EEG pending.  Await further recommendations from neurology-but apart from supportive care-doubt further work-up is required.    Acute metabolic encephalopathy: Likely secondary to AKI-no indication of infection-UA/chest x-ray negative.  Afebrile.  Significantly better-suspect mental status is now close to baseline.  AKI on CKD stage IIIa: AKI  likely hemodynamically mediated-improved with supportive care-creatinine has plateaued around 1.92-1.98 range.  Continue to avoid nephrotoxic agents-repeat electrolytes periodically.  HTN: BP stable-continue metoprolol.  Hypothyroidism: Continue Cytomel.  TSH within normal limits.  CAD s/p PCI to LAD on 04/29/2020: Reviewed outpatient cardiology note-continue Plavix.  PAF: Currently in sinus rhythm-continue Xarelto and metoprolol.  Note-appears to be on both Plavix and Xarelto-being followed by cardiology in the outpatient setting-defer further adjustments to his primary cardiologist at [facility].  Bipolar disorder: Continue Seroquel,Wellbutrin and Depakote.  OSA: CPAP nightly.  Obesity: Estimated body mass index is 38.11 kg/m as calculated from the following:   Height as of this encounter: 6' (1.829 m).   Weight as of this encounter: 127.5 kg.    Diet: Diet Order            Diet heart healthy/carb modified Room service appropriate? Yes; Fluid consistency: Thin  Diet effective now                  Code Status: Full code   Family Communication: Spoke with Daughter [name] [phone]) over phone 11/11  Disposition Plan: Status is: Inpatient  Remains inpatient appropriate because:Inpatient level of care appropriate due to severity of illness   Dispo: The patient is from: Home              Anticipated d/c is to: SNF              Anticipated d/c date is: 1 day              Patient currently is not medically stable to d/c.     Barriers to Discharge: Resolving encephalopathy and AKI-needs SNF on  discharge-have discussed with case management early this morning.  Antimicrobial agents: Anti-infectives (From admission, onward)   None      Time spent: 25 minutes-Greater than 50% of this time was spent in counseling, explanation of diagnosis, planning of further management, and coordination of care.  MEDICATIONS: Scheduled Meds: . ARIPiprazole  20 mg Oral Daily  .  atorvastatin  80 mg Oral Daily  . buPROPion  75 mg Oral Daily  . clopidogrel  75 mg Oral Daily  . divalproex  500 mg Oral Q12H  . insulin aspart  0-15 Units Subcutaneous TID WC  . insulin aspart  0-5 Units Subcutaneous QHS  . liothyronine  25 mcg Oral QAC breakfast  . metoprolol succinate  50 mg Oral Daily  . mirtazapine  15 mg Oral QHS  . pantoprazole  40 mg Oral Q1200  . QUEtiapine  25 mg Oral BID  . QUEtiapine  300 mg Oral QHS  . rivaroxaban  20 mg Oral Q supper   Continuous Infusions:  PRN Meds:.acetaminophen **OR** acetaminophen (TYLENOL) oral liquid 160 mg/5 mL **OR** acetaminophen, hydrALAZINE, senna-docusate   PHYSICAL EXAM: Vital signs: Vitals:   10/08/20 1246 10/08/20 1553 10/08/20 2000 10/09/20 0000  BP: 111/66 108/78 119/71 (!) 95/58  Pulse: 91 90 (!) 102 81  Resp:  Temp:   98 F (36.7 C) 98.8 F (37.1 C)  TempSrc:   Oral Oral  SpO2: 96%  98% 90%  Weight:      Height:       Filed Weights   10/07/20 1457  Weight: 127.5 kg   Body mass index is 38.11 kg/m.   Gen Exam:Alert awake-not in any distress.  Speech significantly better-much more fluent today. HEENT:atraumatic, normocephalic Chest: B/L clear to auscultation anteriorly CVS:S1S2 regular Abdomen:soft non tender, non distended Extremities:no edema Neurology: Non focal Skin: no rash  I have personally reviewed following labs and imaging studies  LABORATORY DATA: CBC: Recent Labs  Lab 10/07/20 1534  WBC 5.9  NEUTROABS 4.6  HGB 12.3*  HCT 42.6  MCV 78.0*  PLT 217    Basic Metabolic Panel: Recent Labs  Lab 10/07/20 1534 10/08/20 0353 10/09/20 0105  NA 135 134* 137  K 5.2* 4.6 4.7  CL 99 102 105  CO2 GLUCOSE 228* 150* 113*  BUN 63* 54* 48*  CREATININE 2.31* 1.92* 1.98*  CALCIUM 10.0 9.4 9.2    GFR: Estimated Creatinine Clearance: 50 mL/min (A) (by C-G formula based on SCr of 1.98 mg/dL (H)).  Liver Function Tests: Recent Labs  Lab 10/07/20 1534  AST  14*  ALT 13  ALKPHOS 52  BILITOT 0.7  PROT 7.5  ALBUMIN 3.5   No results for input(s): LIPASE, AMYLASE in the last 168 hours. Recent Labs  Lab 10/08/20 1621  AMMONIA 37*    Coagulation Profile: Recent Labs  Lab 10/07/20 1534  INR 1.9*    Cardiac Enzymes: Recent Labs  Lab 10/07/20 1728  CKTOTAL 98    BNP (last 3 results) No results for input(s): PROBNP in the last 8760 hours.  Lipid Profile: Recent Labs    10/08/20 0353  CHOL 128  HDL 33*  LDLCALC 81  TRIG 72  CHOLHDL 3.9    Thyroid Function Tests: Recent Labs    10/07/20 1534  TSH 1.021    Anemia Panel: No results for input(s): VITAMINB12, FOLATE, FERRITIN, TIBC, IRON, RETICCTPCT in the last 72 hours.  Urine analysis:    Component Value Date/Time  COLORURINE STRAW (A) 10/07/2020 1728   APPEARANCEUR CLEAR 10/07/2020 1728   APPEARANCEUR Cloudy 08/01/2014 1614   LABSPEC 1.010 10/07/2020 1728   LABSPEC 1.008 08/01/2014 1614   PHURINE 5.0 10/07/2020 1728   GLUCOSEU >=500 (A) 10/07/2020 1728   GLUCOSEU Negative 08/01/2014 1614   HGBUR NEGATIVE 10/07/2020 1728   BILIRUBINUR NEGATIVE 10/07/2020 1728   BILIRUBINUR Negative 08/01/2014 1614   KETONESUR NEGATIVE 10/07/2020 1728   PROTEINUR NEGATIVE 10/07/2020 1728   UROBILINOGEN 0.2 07/01/2014 1809   NITRITE NEGATIVE 10/07/2020 1728   LEUKOCYTESUR NEGATIVE 10/07/2020 1728   LEUKOCYTESUR Negative 08/01/2014 1614    Sepsis Labs: Lactic Acid, Venous    Component Value Date/Time   LATICACIDVEN 0.7 06/17/2014 2338    MICROBIOLOGY: Recent Results (from the past 240 hour(s))  Respiratory Panel by RT PCR (Flu A&B, Covid) - Nasopharyngeal Swab     Status: None   Collection Time: 10/07/20  3:38 PM   Specimen: Nasopharyngeal Swab  Result Value Ref Range Status   SARS Coronavirus 2 by RT PCR NEGATIVE NEGATIVE Final    Comment: (NOTE) SARS-CoV-2 target nucleic acids are NOT DETECTED.  The SARS-CoV-2 RNA is generally detectable in upper  respiratoy specimens during the acute phase of infection. The lowest concentration of SARS-CoV-2 viral copies this assay can detect is 131 copies/mL. A negative result does not preclude SARS-Cov-2 infection and should not be used as the sole basis for treatment or other patient management decisions. A negative result may occur with  improper specimen collection/handling, submission of specimen other than nasopharyngeal swab, presence of viral mutation(s) within the areas targeted by this assay, and inadequate number of viral copies (<131 copies/mL). A negative result must be combined with clinical observations, patient history, and epidemiological information. The expected result is Negative.  Fact Sheet for Patients:  https://www.moore.com/  Fact Sheet for Healthcare Providers:  https://www.young.biz/  This test is no t yet approved or cleared by the [place] FDA and  has been authorized for detection and/or diagnosis of SARS-CoV-2 by FDA under an Emergency Use Authorization (EUA). This EUA will remain  in effect (meaning this test can be used) for the duration of the COVID-19 declaration under Section 564(b)(1) of the Act, 21 U.S.C. section 360bbb-3(b)(1), unless the authorization is terminated or revoked sooner.     Influenza A by PCR NEGATIVE NEGATIVE Final   Influenza B by PCR NEGATIVE NEGATIVE Final    Comment: (NOTE) The Xpert Xpress SARS-CoV-2/FLU/RSV assay is intended as an aid in  the diagnosis of influenza from Nasopharyngeal swab specimens and  should not be used as a sole basis for treatment. Nasal washings and  aspirates are unacceptable for Xpert Xpress SARS-CoV-2/FLU/RSV  testing.  Fact Sheet for Patients: https://www.moore.com/  Fact Sheet for Healthcare Providers: https://www.young.biz/  This test is not yet approved or cleared by the [place] FDA and  has been  authorized for detection and/or diagnosis of SARS-CoV-2 by  FDA under an Emergency Use Authorization (EUA). This EUA will remain  in effect (meaning this test can be used) for the duration of the  Covid-19 declaration under Section 564(b)(1) of the Act, 21  U.S.C. section 360bbb-3(b)(1), unless the authorization is  terminated or revoked. Performed at [facility], [address] [place], [place] [address]     RADIOLOGY STUDIES/RESULTS: CT Head Wo Contrast  Result Date: 10/09/2020 CLINICAL DATA:  Stroke, follow-up. EXAM: CT HEAD WITHOUT CONTRAST TECHNIQUE: Contiguous axial images were obtained from the base of the skull through the  vertex without intravenous contrast. COMPARISON:  MRI/MRA head 10/07/2020.  Head CT 10/07/2020. FINDINGS: Brain: Mild generalized cerebral atrophy. Minimal ill-defined hypoattenuation within the cerebral white matter is nonspecific, but compatible with chronic small vessel ischemic disease. There is no acute intracranial hemorrhage. No demarcated cortical infarct is identified. No extra-axial fluid collection. No evidence of intracranial mass. No midline shift. Vascular: No hyperdense vessel.  Atherosclerotic calcifications. Skull: Normal. Negative for fracture or focal lesion. Sinuses/Orbits: Visualized orbits show no acute finding. Mild ethmoid sinus mucosal thickening. Other: Small right mastoid effusion. IMPRESSION: No evidence of acute intracranial abnormality. Mild cerebral atrophy and chronic small vessel ischemic disease. Mild ethmoid sinus mucosal thickening. Small right mastoid effusion. Electronically Signed   By: Jackey Loge DO   On: 10/09/2020 07:31   CT HEAD WO CONTRAST  Result Date: 10/07/2020 CLINICAL DATA:  Neurologic deficit EXAM: CT HEAD WITHOUT CONTRAST TECHNIQUE: Contiguous axial images were obtained from the base of the skull through the vertex without intravenous contrast. COMPARISON:  08/22/2018 FINDINGS: Brain: No acute infarct or  hemorrhage. Lateral ventricles and midline structures are unremarkable. No acute extra-axial fluid collections. No mass effect. Vascular: No hyperdense vessel. There is severe atherosclerosis of the bilateral internal carotid arteries, stable. Skull: Normal. Negative for fracture or focal lesion. Sinuses/Orbits: Minimal fluid within the right mastoid air cells. Paranasal sinuses are clear. Other: None. IMPRESSION: 1. No acute intrathoracic process. 2. Minimal fluid within the right mastoid air cells. 3. Stable atherosclerosis. Electronically Signed   By: Sharlet Salina M.D.   On: 10/07/2020 17:46   MR ANGIO HEAD WO CONTRAST  Result Date: 10/07/2020 CLINICAL DATA:  69 year old male with altered mental status, neurologic deficit, TIA. EXAM: MRA HEAD WITHOUT CONTRAST TECHNIQUE: Angiographic images of the Circle of Willis were obtained using MRA technique without intravenous contrast. COMPARISON:  Brain MRI today.  Intracranial MRA 03/07/2011. FINDINGS: Severe stenosis of the diminutive distal right vertebral artery demonstrated in 2012, now with essentially absent flow signal in the right V4 segment. The distal left vertebral artery remains patent. Visible left PICA remains patent. The left vertebral supplies the basilar with moderate to severe increased stenosis at the left vertebrobasilar junction (series 1052, image 11. There is minimal reconstitution of the right vertebrobasilar junction. Patent basilar artery with chronic mild irregularity and stenosis. SCA and left PCA origins remain patent. Fetal right PCA origin again noted. Diminutive or absent left posterior communicating artery. Moderate to severe bilateral PCA P2 segment stenoses are chronic (image 1046 image 18 and 19) and not significantly changed. Preserved distal PCA flow signal. Antegrade flow in both ICA siphons. Severe bilateral distal ICA irregularity and stenosis, on the left just proximal to the ICA terminus (series 1040, image 7) and on the  right at the anterior genu (same image). These have substantially progressed since 2012. Carotid termini remain patent. Mild to moderate right ACA origin stenosis is increased. Anterior communicating artery and visible ACA branches are otherwise within normal limits. Left MCA M1 mild irregularity and stenosis has not significantly changed. Left MCA trifurcation remains patent. Visible left MCA branches are stable. Moderate chronic right MCA M1 stenosis is stable (series 10 40 image 5). Mild to moderate stenosis also at the right MCA bifurcation is stable (image 13). Visible right MCA branches appear stable. IMPRESSION: 1. Chronically advanced intracranial atherosclerosis. 2. The following have progressed since a 2012 MRA: - occlusion of the non-dominant distal Right Vertebral Artery. - Moderate to Severe stenosis at the Left Vertebrobasilar Junction. - Severe stenosis of  both ICA siphons (right anterior genu and left supraclinoid segment). - mild to moderate Right ACA origin stenosis. 3. The following are stable since 2012: - Moderate Right MCA M1 stenosis. - Moderate to Severe bilateral PCA P2 stenoses. Electronically Signed   By: Odessa Fleming M.D.   On: 10/07/2020 19:34   MR BRAIN WO CONTRAST  Result Date: 10/07/2020 CLINICAL DATA:  70 year old male with altered mental status, neurologic deficit, TIA. EXAM: MRI HEAD WITHOUT CONTRAST TECHNIQUE: Multiplanar, multiecho pulse sequences of the brain and surrounding structures were obtained without intravenous contrast. COMPARISON:  Head CT earlier today.  Brain MRI 03/07/2011. FINDINGS: Brain: No restricted diffusion to suggest acute infarction. No midline shift, mass effect, evidence of mass lesion, ventriculomegaly, extra-axial collection or acute intracranial hemorrhage. Cervicomedullary junction and pituitary are within normal limits. Mild for age vague, patchy cerebral white matter T2 and FLAIR hyperintensity is stable. No cortical encephalomalacia identified.  Chronic lacunar infarct medial left pons is subtle and was better visualized in 2012. No chronic cerebral blood products identified. Deep gray matter nuclei and cerebellum remain within normal limits. Vascular: Major intracranial vascular flow voids appear stable since 2012 including decreased distal right vertebral artery flow void (series 15, image 3 today). Generalized intracranial artery tortuosity again noted. Skull and upper cervical spine: Visible bone marrow signal and cervical spine appear stable since 2012. Sinuses/Orbits: Stable, negative.  Hyperplastic paranasal sinuses. Other: Mild chronic mastoid effusions, regressed on the left but increased on the right since 2012. Still, significance is doubtful. Other visible internal auditory structures appear normal. Visible scalp and face appear negative. IMPRESSION: 1. No acute intracranial abnormality. 2. Mild for age chronic small vessel disease, not significantly changed since 2012. 3. Evidence of chronically decreased flow in the distal right vertebral artery. See also MRA today reported separately. Electronically Signed   By: Odessa Fleming M.D.   On: 10/07/2020 19:25   US RENAL  Result Date: 10/07/2020 CLINICAL DATA:  Acute kidney injury EXAM: RENAL / URINARY TRACT ULTRASOUND COMPLETE COMPARISON:  Renal ultrasound 07/31/2014, CT abdomen pelvis 08/01/2014 FINDINGS: Right Kidney: Renal measurements: 11.2 x 5.4 x 4.4 cm = volume: 139 mL. Echogenicity is within normal limits. No concerning renal mass, shadowing calculus or hydronephrosis. Left Kidney: Renal measurements: 11.9 x 6.1 x 5.8 cm = volume: 219 mL. Echogenicity is within normal limits. No concerning renal mass, shadowing calculus or hydronephrosis. Bladder: Moderate bladder distention without wall thickening, calculi or debris. Other: None. IMPRESSION: Moderate bladder distension without other concerning bladder abnormality. Otherwise unremarkable renal ultrasound. Electronically Signed   By: Kreg Shropshire M.D.   On: 10/07/2020 19:35   DG Chest Portable 1 View  Result Date: 10/07/2020 CLINICAL DATA:  Slurred speech EXAM: PORTABLE CHEST 1 VIEW COMPARISON:  Mar 31, 2020 FINDINGS: No edema or airspace opacity. Heart is slightly enlarged with pulmonary vascularity normal. No adenopathy. No bone lesions. IMPRESSION: Heart slightly enlarged.  No edema or airspace opacity. Electronically Signed   By: Bretta Bang III M.D.   On: 10/07/2020 15:01   ECHOCARDIOGRAM COMPLETE  Result Date: 10/08/2020    ECHOCARDIOGRAM REPORT   Patient Name:   KAY RICCIUTI Date of Exam: 10/08/2020 Medical Rec #:  [id number]        Height:       72.0 in Accession #:    [id number]       Weight:       281.0 lb Date of Birth:  07-31-1951        BSA:  2.462 m Patient Age:    42 years         BP:           123/72 mmHg Patient Gender: M                HR:           86 bpm. Exam Location:  Inpatient Procedure: 2D Echo and Intracardiac Opacification Agent Indications:    TIA 435.9 / G45.9  History:        Patient has prior history of Echocardiogram examinations, most                 recent 03/18/2020. Risk Factors:Hypertension, Diabetes and Sleep                 Apnea. Chronic kidney disease.  Sonographer:    Leta Jungling RDCS Referring Phys: [id number] Emeline General  Sonographer Comments: Technically difficult study due to poor echo windows. IMPRESSIONS  1. Left ventricular ejection fraction, by estimation, is 65 to 70%. The left ventricle has normal function. The left ventricle has no regional wall motion abnormalities. There is mild left ventricular hypertrophy. Left ventricular diastolic parameters are consistent with Grade I diastolic dysfunction (impaired relaxation).  2. Right ventricular systolic function is normal. The right ventricular size is normal.  3. The mitral valve is normal in structure. No evidence of mitral valve regurgitation. No evidence of mitral stenosis. Severe mitral annular calcification.  4. The aortic  valve is tricuspid. There is moderate calcification of the aortic valve. There is moderate thickening of the aortic valve. Aortic valve regurgitation is not visualized. Mild to moderate aortic valve sclerosis/calcification is present, without any evidence of aortic stenosis.  5. The inferior vena cava is normal in size with greater than 50% respiratory variability, suggesting right atrial pressure of 3 mmHg. Conclusion(s)/Recommendation(s): No intracardiac source of embolism detected on this transthoracic study. A transesophageal echocardiogram is recommended to exclude cardiac source of embolism if clinically indicated. FINDINGS  Left Ventricle: Left ventricular ejection fraction, by estimation, is 65 to 70%. The left ventricle has normal function. The left ventricle has no regional wall motion abnormalities. Definity contrast agent was given IV to delineate the left ventricular  endocardial borders. The left ventricular internal cavity size was normal in size. There is mild left ventricular hypertrophy. Left ventricular diastolic parameters are consistent with Grade I diastolic dysfunction (impaired relaxation). Right Ventricle: The right ventricular size is normal. No increase in right ventricular wall thickness. Right ventricular systolic function is normal. Left Atrium: Left atrial size was normal in size. Right Atrium: Right atrial size was normal in size. Pericardium: There is no evidence of pericardial effusion. Mitral Valve: The mitral valve is normal in structure. There is moderate thickening of the mitral valve leaflet(s). There is moderate calcification of the mitral valve leaflet(s). Severe mitral annular calcification. No evidence of mitral valve regurgitation. No evidence of mitral valve stenosis. Tricuspid Valve: The tricuspid valve is normal in structure. Tricuspid valve regurgitation is not demonstrated. No evidence of tricuspid stenosis. Aortic Valve: The aortic valve is tricuspid. There is  moderate calcification of the aortic valve. There is moderate thickening of the aortic valve. Aortic valve regurgitation is not visualized. Mild to moderate aortic valve sclerosis/calcification is present, without any evidence of aortic stenosis. Pulmonic Valve: The pulmonic valve was normal in structure. Pulmonic valve regurgitation is not visualized. No evidence of pulmonic stenosis. Aorta: The aortic root is normal in size and structure. Venous: The  inferior vena cava is normal in size with greater than 50% respiratory variability, suggesting right atrial pressure of 3 mmHg. IAS/Shunts: No atrial level shunt detected by color flow Doppler.  LEFT VENTRICLE PLAX 2D LVIDd:         4.20 cm      Diastology LVIDs:         3.05 cm      LV e' medial:    5.00 cm/s LV PW:         1.00 cm      LV E/e' medial:  12.9 LV IVS:        1.20 cm      LV e' lateral:   6.09 cm/s LVOT diam:     2.05 cm      LV E/e' lateral: 10.6 LV SV:         50 LV SV Index:   20 LVOT Area:     3.30 cm  LV Volumes (MOD) LV vol d, MOD A2C: 78.6 ml LV vol d, MOD A4C: 172.0 ml LV vol s, MOD A2C: 31.1 ml LV vol s, MOD A4C: 46.9 ml LV SV MOD A2C:     47.5 ml LV SV MOD A4C:     172.0 ml LV SV MOD BP:      82.0 ml RIGHT VENTRICLE TAPSE (M-mode): 2.3 cm LEFT ATRIUM             Index       RIGHT ATRIUM           Index LA diam:        3.50 cm 1.42 cm/m  RA Area:     14.60 cm LA Vol (A2C):   43.0 ml 17.47 ml/m RA Volume:   30.30 ml  12.31 ml/m LA Vol (A4C):   42.3 ml 17.18 ml/m LA Biplane Vol: 44.1 ml 17.91 ml/m  AORTIC VALVE LVOT Vmax:   91.60 cm/s LVOT Vmean:  63.800 cm/s LVOT VTI:    0.150 m  AORTA Ao Root diam: 3.30 cm Ao Asc diam:  3.40 cm MITRAL VALVE MV Area (PHT): 4.54 cm    SHUNTS MV Decel Time: 167 msec    Systemic VTI:  0.15 m MV E velocity: 64.50 cm/s  Systemic Diam: 2.05 cm MV A velocity: 90.30 cm/s MV E/A ratio:  0.71 Donato Schultz MD Electronically signed by Donato Schultz MD Signature Date/Time: 10/08/2020/11:31:40 AM    Final    VAS US  CAROTID  Result Date: 10/09/2020 Carotid Arterial Duplex Study Comparison Study:  09/02/2015- carotid artery duplex Performing Technologist: Gertie Fey MHA, RDMS, RVT, RDCS  Examination Guidelines: A complete evaluation includes B-mode imaging, spectral Doppler, color Doppler, and power Doppler as needed of all accessible portions of each vessel. Bilateral testing is considered an integral part of a complete examination. Limited examinations for reoccurring indications may be performed as noted.  Right Carotid Findings: +----------+--------+--------+--------+--------------------------+--------+           PSV cm/sEDV cm/sStenosisPlaque Description        Comments +----------+--------+--------+--------+--------------------------+--------+ CCA Prox  93      21                                                 +----------+--------+--------+--------+--------------------------+--------+ CCA Distal107     24  smooth and heterogenous            +----------+--------+--------+--------+--------------------------+--------+ ICA Prox  117     19              heterogenous and irregular         +----------+--------+--------+--------+--------------------------+--------+ ICA Distal75      31                                                 +----------+--------+--------+--------+--------------------------+--------+ ECA       263     24              heterogenous and irregular         +----------+--------+--------+--------+--------------------------+--------+ +----------+--------+-------+----------------+-------------------+           PSV cm/sEDV cmsDescribe        Arm Pressure (mmHG) +----------+--------+-------+----------------+-------------------+ [id number]            Multiphasic, WNL                    +----------+--------+-------+----------------+-------------------+ +---------+--------+--+--------+---------+ VertebralPSV cm/s34EDV cm/sAntegrade  +---------+--------+--+--------+---------+  Left Carotid Findings: +----------+--------+--------+--------+-----------------------+--------+           PSV cm/sEDV cm/sStenosisPlaque Description     Comments +----------+--------+--------+--------+-----------------------+--------+ CCA Prox  159     26              smooth and heterogenous         +----------+--------+--------+--------+-----------------------+--------+ CCA Distal171     21              smooth and heterogenous         +----------+--------+--------+--------+-----------------------+--------+ ICA Prox  62      11              smooth and heterogenous         +----------+--------+--------+--------+-----------------------+--------+ ICA Distal72      23                                              +----------+--------+--------+--------+-----------------------+--------+ ECA       185     17                                              +----------+--------+--------+--------+-----------------------+--------+ +----------+--------+--------+----------------+-------------------+           PSV cm/sEDV cm/sDescribe        Arm Pressure (mmHG) +----------+--------+--------+----------------+-------------------+ [id number]              Multiphasic, WNL                    +----------+--------+--------+----------------+-------------------+ +---------+--------+--+--------+--+---------+ VertebralPSV cm/s39EDV cm/s11Antegrade +---------+--------+--+--------+--+---------+   Summary: Right Carotid: Velocities in the right ICA are consistent with a 1-39% stenosis.                The ECA appears >50% stenosed. Left Carotid: Velocities in the left ICA are consistent with a 1-39% stenosis. Vertebrals:  Bilateral vertebral arteries demonstrate antegrade flow. Right              vertebral artery demonstrates high resistant flow. Subclavians: Normal flow hemodynamics were seen in bilateral subclavian  arteries.  *See table(s) above for measurements and observations.  Electronically signed by Coral Else MD on 10/09/2020 at 7:33:18 AM.    Final      LOS: 2 days   Jeoffrey Massed, MD  [facility]    To contact the attending provider between 7A-7P or the covering provider during after hours 7P-7A, please log into the web site [email or web address] and access using universal [place] password for that web site. If you do not have the password, please call the hospital operator.  10/09/2020, 10:00 AM

## 2020-10-09 NOTE — NC FL2 (Signed)
[place] MEDICAID FL2 LEVEL OF CARE SCREENING TOOL     IDENTIFICATION  Patient Name: Christian [name] Birthdate: 08/16/1951 Sex: male Admission Date (Current Location): 10/07/2020  [facility] and [place] Number:  Producer, television/film/video and Address:  [facility], [address], [place], [place] [address]      Provider Number: [id number]  Attending Physician Name and Address:  Maretta Bees, MD  Relative Name and Phone Number:       Current Level of Care: Hospital Recommended Level of Care: Skilled Nursing Facility Prior Approval Number:    Date Approved/Denied:   PASRR Number: Manual review  Discharge Plan: SNF    Current Diagnoses: Patient Active Problem List   Diagnosis Date Noted  . CVA (cerebral vascular accident) (HCC) 10/07/2020  . Type 2 diabetes mellitus with stage 3 chronic kidney disease (HCC) 04/01/2020  . CAD (coronary artery disease), native coronary artery 04/01/2020  . NSTEMI (non-ST elevated myocardial infarction) (HCC) 03/31/2020  . Obesity, Class III, BMI 40-49.9 (morbid obesity) (HCC) 03/31/2020  . Aspiration pneumonia of both lower lobes due to gastric secretions (HCC) 03/21/2020  . Sacral decubitus ulcer, stage II (HCC) 03/19/2020  . Chronic diastolic CHF (congestive heart failure) (HCC) 03/17/2020  . Atrial flutter, paroxysmal (HCC) 12/27/2019  . Constipation 09/16/2018  . Bipolar 1 disorder, depressed (HCC) 08/22/2018  . Encephalopathy   . Paroxysmal atrial fibrillation (HCC) 06/18/2018  . History of pulmonary embolism 06/17/2018  . Generalized weakness 03/27/2018  . Diabetes mellitus type 2 in nonobese (HCC) 03/27/2018  . Pulmonary embolism (HCC) 03/27/2018  . Fall   . Laceration of eyebrow   . Bipolar I disorder, most recent episode depressed, severe w psychosis (HCC) 12/22/2015  . Bipolar affective disorder, depressed, severe, with psychotic behavior (HCC)   . Bipolar I disorder, current or most recent episode  depressed, with psychotic features (HCC)   . Severe bipolar I disorder with depression (HCC)   . Bipolar I disorder, most recent episode depressed, severe with psychotic features (HCC)   . Overdose 09/11/2015  . Acute respiratory failure with hypoxia (HCC) 09/05/2015  . Elevated troponin 09/05/2015  . Somnolence 09/05/2015  . Bipolar depression (HCC) 09/05/2015  . Acute bilateral deep vein thrombosis (DVT) of femoral veins (HCC) 09/05/2015  . Bilateral pulmonary embolism (HCC) 09/02/2015  . Labile hypertension   . Pulmonary embolism with acute cor pulmonale (HCC)   . Dyspnea   . Orthostatic hypotension   . Bipolar disorder, in partial remission, most recent episode depressed (HCC)   . Syncope, near 08/31/2015  . Affective psychosis, bipolar (HCC)   . Bipolar affective disorder, depressed, mild (HCC) 08/28/2015  . Pressure ulcer 07/30/2015  . Protein-calorie malnutrition, severe (HCC) 07/30/2015  . Dehydration 07/29/2015  . Diabetes (HCC) 07/09/2015  . UTI (urinary tract infection) 06/19/2014  . Positive blood culture 06/19/2014  . Type 2 diabetes mellitus with complication, without long-term current use of insulin (HCC) 06/02/2014  . Other and unspecified hyperlipidemia 06/02/2014  . OSA on CPAP 06/02/2014  . Bilateral lower extremity edema: chronic with venous stasis changes 06/02/2014  . Gout 06/02/2014  . Hypertension     Orientation RESPIRATION BLADDER Height & Weight     Self, Time, Situation, Place  Normal Incontinent Weight: 281 lb (127.5 kg) Height:  6' (182.9 cm)  BEHAVIORAL SYMPTOMS/MOOD NEUROLOGICAL BOWEL NUTRITION STATUS      Continent Diet (heart healthy/carb modified)  AMBULATORY STATUS COMMUNICATION OF NEEDS Skin   Extensive Assist Verbally Normal  Personal Care Assistance Level of Assistance  Bathing, Feeding, Dressing Bathing Assistance: Limited assistance Feeding assistance: Independent Dressing Assistance: Limited  assistance     Functional Limitations Info  Speech     Speech Info: Impaired (dysarthria)    SPECIAL CARE FACTORS FREQUENCY  PT (By licensed PT), OT (By licensed OT), Speech therapy     PT Frequency: 5x/wk OT Frequency: 5x/wk     Speech Therapy Frequency: 5x/wk      Contractures Contractures Info: Not present    Additional Factors Info  Code Status, Allergies, Psychotropic, Insulin Sliding Scale Code Status Info: Full Allergies Info: Amlodipine, Onglyza (Saxagliptin), Zyprexa (Olanzapine) Psychotropic Info: Abilify 20mg  daily; Wellbutrin 75mg  daily; Depakote 500mg  every 12 hours; Remeron 15mg  daily at bed; Seroquel 25mg  2x/day; Seroquel 300mg  daily at bed Insulin Sliding Scale Info: 0-15 units 3x/day with meals; 0-5 units at bed       Current Medications (10/09/2020):  This is the current hospital active medication list Current Facility-Administered Medications  Medication Dose Route Frequency Provider Last Rate Last Admin  . acetaminophen (TYLENOL) tablet 650 mg  650 mg Oral Q4H PRN T, MD       Or  . acetaminophen (TYLENOL) 160 MG/5ML solution 650 mg  650 mg Per Tube Q4H PRN T, MD       Or  . acetaminophen (TYLENOL) suppository 650 mg  650 mg Rectal Q4H PRN T, MD      . ARIPiprazole (ABILIFY) tablet 20 mg  20 mg Oral Daily T, MD   20 mg at 10/09/20 0826  . atorvastatin (LIPITOR) tablet 80 mg  80 mg Oral Daily 13/09/2020, MD   80 mg at 10/09/20 0826  . buPROPion [facility]) tablet 75 mg  75 mg Oral Daily [employer] T, MD   75 mg at 10/09/20 0827  . clopidogrel (PLAVIX) tablet 75 mg  75 mg Oral Daily 13/11/21 T, MD   75 mg at 10/09/20 0826  . divalproex (DEPAKOTE) DR tablet 500 mg  500 mg Oral Q12H 13/11/21 T, MD   500 mg at 10/09/20 [employer]  . hydrALAZINE (APRESOLINE) tablet 25 mg  25 mg Oral Q6H PRN 13/11/21 T, MD      . insulin aspart (novoLOG) injection 0-15 Units  0-15 Units Subcutaneous TID WC 13/11/21, MD   2 Units at 10/08/20 1903  . insulin aspart (novoLOG) injection 0-5 Units  0-5 Units Subcutaneous QHS 13/11/21, MD   2 Units at 10/08/20 2241  . liothyronine (CYTOMEL) tablet 25 mcg  25 mcg Oral QAC breakfast 01-28-1985, MD   25 mcg at 10/09/20 [phone]  . metoprolol succinate (TOPROL-XL) 24 hr tablet 50 mg  50 mg Oral Daily Emeline General T, MD   50 mg at 10/09/20 0826  . mirtazapine (REMERON SOL-TAB) disintegrating tablet 15 mg  15 mg Oral QHS 2242 T, MD   15 mg at 10/08/20 2241  . pantoprazole (PROTONIX) EC tablet 40 mg  40 mg Oral Q1200 Ghimire, 13/11/21, MD      . QUEtiapine (SEROQUEL) tablet 25 mg  25 mg Oral BID 7622 T, MD   25 mg at 10/09/20 0826  . QUEtiapine (SEROQUEL) tablet 300 mg  300 mg Oral QHS 13/11/21 T, MD   300 mg at 10/08/20 2241  . rivaroxaban (XARELTO) tablet 20 mg  20 mg Oral Q supper 13/10/21, MD      .  senna-docusate (Senokot-S) tablet 1 tablet  1 tablet Oral QHS PRN Emeline General, MD       Facility-Administered Medications Ordered in Other Encounters  Medication Dose Route Frequency Provider Last Rate Last Admin  . ondansetron (ZOFRAN) injection 4 mg  4 mg Intravenous Once PRN Naomie Dean, MD         Discharge Medications: Please see discharge summary for a list of discharge medications.  Relevant Imaging Results:  Relevant Lab Results:   Additional Information SS#: [id number]  Baldemar Lenis, LCSW

## 2020-10-09 NOTE — Progress Notes (Signed)
STROKE TEAM PROGRESS NOTE   INTERVAL HISTORY Patient is sitting up in bed.  His speech is much improved today.  His mentation is also clear.  He still has mild asterixis and action tremor but it appears to be improving.  Ammonia is mildly elevated.  Serum creatinine remains elevated at 1.98.  Repeat CT scan of the head this morning shows no acute abnormality.  Vitals:   10/09/20 0000 10/09/20 0800 10/09/20 1000 10/09/20 1159  BP: (!) 95/58 (!) 119/52 128/80 110/70  Pulse: 81 87 83 79  Resp: 18     Temp: 98.8 F (37.1 C)  98.3 F (36.8 C) 98.3 F (36.8 C)  TempSrc: Oral   Axillary  SpO2: 90% 98% 98% 93%  Weight:      Height:       CBC:  Recent Labs  Lab 10/07/20 1534  WBC 5.9  NEUTROABS 4.6  HGB 12.3*  HCT 42.6  MCV 78.0*  PLT 217   Basic Metabolic Panel:  Recent Labs  Lab 10/08/20 0353 10/09/20 0105  NA 134* 137  K 4.6 4.7  CL 102 105  CO2 24 22  GLUCOSE 150* 113*  BUN 54* 48*  CREATININE 1.92* 1.98*  CALCIUM 9.4 9.2   Lipid Panel:  Recent Labs  Lab 10/08/20 0353  CHOL 128  TRIG 72  HDL 33*  CHOLHDL 3.9  VLDL 14  LDLCALC 81   HgbA1c:  Recent Labs  Lab 10/08/20 0353  HGBA1C 11.1*   Urine Drug Screen:  Recent Labs  Lab 10/07/20 1728  LABOPIA NONE DETECTED  COCAINSCRNUR NONE DETECTED  LABBENZ NONE DETECTED  AMPHETMU NONE DETECTED  THCU NONE DETECTED  LABBARB NONE DETECTED    Alcohol Level  Recent Labs  Lab 10/07/20 1534  ETH <10    IMAGING past 24 hours CT Head Wo Contrast  Result Date: 10/09/2020 CLINICAL DATA:  Stroke, follow-up. EXAM: CT HEAD WITHOUT CONTRAST TECHNIQUE: Contiguous axial images were obtained from the base of the skull through the vertex without intravenous contrast. COMPARISON:  MRI/MRA head 10/07/2020.  Head CT 10/07/2020. FINDINGS: Brain: Mild generalized cerebral atrophy. Minimal ill-defined hypoattenuation within the cerebral white matter is nonspecific, but compatible with chronic small vessel ischemic disease.  There is no acute intracranial hemorrhage. No demarcated cortical infarct is identified. No extra-axial fluid collection. No evidence of intracranial mass. No midline shift. Vascular: No hyperdense vessel.  Atherosclerotic calcifications. Skull: Normal. Negative for fracture or focal lesion. Sinuses/Orbits: Visualized orbits show no acute finding. Mild ethmoid sinus mucosal thickening. Other: Small right mastoid effusion. IMPRESSION: No evidence of acute intracranial abnormality. Mild cerebral atrophy and chronic small vessel ischemic disease. Mild ethmoid sinus mucosal thickening. Small right mastoid effusion. Electronically Signed   By: Jackey Loge DO   On: 10/09/2020 07:31   EEG adult  Result Date: 10/09/2020 Charlsie Quest, MD     10/09/2020 11:06 AM Patient Name: Mavrik Freimark Stenseth MRN: [id number] Epilepsy Attending: Charlsie Quest Referring Physician/Provider: Dr Delia Heady Date: 10/08/2020 Duration: 25.15 mins Patient history: 69 year old male with encephalopathy as well as speech disturbance. EEG to evaluate for seizures. Level of alertness: Awake, drowsy AEDs during EEG study: Depakote Technical aspects: This EEG study was done with scalp electrodes positioned according to the 10-20 International system of electrode placement. Electrical activity was acquired at a sampling rate of 500Hz  and reviewed with a high frequency filter of 70Hz  and a low frequency filter of 1Hz . EEG data were recorded continuously and digitally stored. Description: The  posterior dominant rhythm consists of 8-9 Hz activity of moderate voltage (25-35 uV) seen predominantly in posterior head regions, symmetric and reactive to eye opening and eye closing. Drowsiness was characterized by attenuation of the posterior background rhythm.  Hyperventilation and photic stimulation were not performed.   IMPRESSION: This study is within normal limits. No seizures or epileptiform discharges were seen throughout the recording. Priyanka O  Yadav   VAS US CAROTID  Result Date: 10/09/2020 Carotid Arterial Duplex Study Comparison Study:  09/02/2015- carotid artery duplex Performing Technologist: Gertie Fey MHA, RDMS, RVT, RDCS  Examination Guidelines: A complete evaluation includes B-mode imaging, spectral Doppler, color Doppler, and power Doppler as needed of all accessible portions of each vessel. Bilateral testing is considered an integral part of a complete examination. Limited examinations for reoccurring indications may be performed as noted.  Right Carotid Findings: +----------+--------+--------+--------+--------------------------+--------+           PSV cm/sEDV cm/sStenosisPlaque Description        Comments +----------+--------+--------+--------+--------------------------+--------+ CCA Prox  93      21                                                 +----------+--------+--------+--------+--------------------------+--------+ CCA Distal107     24              smooth and heterogenous            +----------+--------+--------+--------+--------------------------+--------+ ICA Prox  117     19              heterogenous and irregular         +----------+--------+--------+--------+--------------------------+--------+ ICA Distal75      31                                                 +----------+--------+--------+--------+--------------------------+--------+ ECA       263     24              heterogenous and irregular         +----------+--------+--------+--------+--------------------------+--------+ +----------+--------+-------+----------------+-------------------+           PSV cm/sEDV cmsDescribe        Arm Pressure (mmHG) +----------+--------+-------+----------------+-------------------+ [id number]            Multiphasic, WNL                    +----------+--------+-------+----------------+-------------------+ +---------+--------+--+--------+---------+ VertebralPSV cm/s34EDV  cm/sAntegrade +---------+--------+--+--------+---------+  Left Carotid Findings: +----------+--------+--------+--------+-----------------------+--------+           PSV cm/sEDV cm/sStenosisPlaque Description     Comments +----------+--------+--------+--------+-----------------------+--------+ CCA Prox  159     26              smooth and heterogenous         +----------+--------+--------+--------+-----------------------+--------+ CCA Distal171     21              smooth and heterogenous         +----------+--------+--------+--------+-----------------------+--------+ ICA Prox  62      11              smooth and heterogenous         +----------+--------+--------+--------+-----------------------+--------+ ICA Distal72      23                                              +----------+--------+--------+--------+-----------------------+--------+  ECA       185     17                                              +----------+--------+--------+--------+-----------------------+--------+ +----------+--------+--------+----------------+-------------------+           PSV cm/sEDV cm/sDescribe        Arm Pressure (mmHG) +----------+--------+--------+----------------+-------------------+ [id number]              Multiphasic, WNL                    +----------+--------+--------+----------------+-------------------+ +---------+--------+--+--------+--+---------+ VertebralPSV cm/s39EDV cm/s11Antegrade +---------+--------+--+--------+--+---------+   Summary: Right Carotid: Velocities in the right ICA are consistent with a 1-39% stenosis.                The ECA appears >50% stenosed. Left Carotid: Velocities in the left ICA are consistent with a 1-39% stenosis. Vertebrals:  Bilateral vertebral arteries demonstrate antegrade flow. Right              vertebral artery demonstrates high resistant flow. Subclavians: Normal flow hemodynamics were seen in bilateral subclavian               arteries. *See table(s) above for measurements and observations.  Electronically signed by Coral Else MD on 10/09/2020 at 7:33:18 AM.    Final     PHYSICAL EXAM Obese middle-aged Caucasian male not in distress. . Afebrile. Head is nontraumatic. Neck is supple without bruit.    Cardiac exam no murmur or gallop. Lungs are clear to auscultation. Distal pulses are well felt. He has bilateral asterixis of both wrist and action tremor Neurological Exam : He is awake alert and interactive.  Speech is slurred with some word finding difficulties and trouble completing sentences.  Follows simple midline and one-step commands.  Extraocular movements are full range without nystagmus.  Blinks to threat bilaterally.  Face is symmetric without weakness.  Tongue midline.  Motor system exam shows symmetric upper and lower extremity strength without focal weakness.  Deep tendon flexes symmetric.  Plantars are downgoing.  Sensation appears intact.  Gait not tested. ASSESSMENT/PLAN Mr. Webster Patrone Rumbold is a 69 y.o. male with history of BL lower extremity edema, Bipolar 1 disorder, CKD, gout, HTN, HLD, DM2, atrial flutter diagnosed in May 2021 and on Xarelto, PCI with 2 stents in June 2021 and started on plavix presenting with aphasia.   Encephalopathy - doubt TIA, no stroke  CT head No acute abnormality. R mastoid fluid. Atherosclerosis   MRI  No acute abnormality. Small vessel disease stable. Distal R VA decreased flow.   MRA advanced atherosclerosis. Progressed since 2012 - Distal R VA occlusion, moderate to severe L VBJ, severe B ICA siphons, mid to moderate R ACA origin stenoses. Stable since 2012 - moderate R M1, moderate to severe B P2 stenoses  Carotid Doppler  Pending   2D Echo EF 65-70%. No source of embolus   LDL 81  HgbA1c 11.1  VTE prophylaxis - Xarelto  clopidogrel 75 mg daily and Xarelto (rivaroxaban) daily prior to admission, now on clopidogrel 75 mg daily and Xarelto (rivaroxaban)  daily.   Therapy recommendations:  pending   Disposition:  pending   Atrial Fibrillation  Home anticoagulation:  Xarelto (rivaroxaban) daily continued in the hospital . Continue Xarelto (rivaroxaban) daily at discharge   Hypertension  Stable . BP goal normotensive  Hyperlipidemia  Home meds:  lipitor 10  Now on lipitor 80  LDL 81, goal < 70  Continue statin at discharge  Diabetes type II Uncontrolled  HgbA1c 11.1, goal < 7.0  Follow up PCP  Other Stroke Risk Factors  Advanced Age >/= 63   Obesity, Body mass index is 38.11 kg/m., BMI >/= 30 associated with increased stroke risk, recommend weight loss, diet and exercise as appropriate   Coronary artery disease s/p PCI 03/2020 on plavix  Obstructive sleep apnea, on CPAP at home  Other Active Problems  CKD stage IIIa  biopolar d/o  Hospital day # 2  Patient presented with subacute speech and word finding difficulties of unclear etiology MRI is negative for acute stroke.  Repeat CT scan of the head this morning also shows no acute stroke.  Exam shows evidence of asterixis and tremors which suggest encephalopathy.   Ammonia is slightly elevated and as his renal function..  Repeat CT head tomorrow morning discussed with Dr. Jerral Ralph.  Continue treatment of medical issues as per primary team.  Stroke team will sign off.  Kindly call for questions.  Greater than 50% time during this 25-minute visit was spent in counseling and coordination of care about his slurred speech and encephalopathy and discussion with care team and answering questions Delia Heady, MD To contact Stroke Continuity provider, please refer to [email or web address]. After hours, contact General Neurology

## 2020-10-09 NOTE — Progress Notes (Signed)
Occupational Therapy Evaluation Patient Details Name: Christian [name] MRN: [id number] DOB: [birth year] Today's Date: 10/09/2020    History of Present Illness Patient is a 69 y.o. male CAD s/p PCI, PAF on Xarelto, DM-2, HTN, HLD, CKD stage IIIa, bipolar disorder, OSA on CPAP-presented to the ED with aphasia. MRI brain negative for acute intracranial abnormality. MRI of the brain shows diminished flow in the right vertebral artery; encephalopathy.    Clinical Impression   On entry, pt completely saturated in urine and had not called for help. Min A to mobilize to chair and mod A with LB ADL @ RW level. Tremors noted with mobility. Pt easily fatigued with activity. Feel pt will benefit form rehab at SNF to facilitate safe DC home. Will follow acutely.    Follow Up Recommendations  SNF;Supervision/Assistance - 24 hour    Equipment Recommendations  None recommended by OT    Recommendations for Other Services       Precautions / Restrictions Precautions Precautions: Fall      Mobility Bed Mobility Overal bed mobility: Needs Assistance Bed Mobility: Supine to Sit     Supine to sit: Min assist     General bed mobility comments: difficulty achieving full upright posture using bed rail; sat with L lateral lean with R foot off floor until pt cued to scoot forward to put feet on floor    Transfers Overall transfer level: Needs assistance   Transfers: Sit to/from Stand Sit to Stand: Min assist              Balance Overall balance assessment: Needs assistance   Sitting balance-Leahy Scale: Fair       Standing balance-Leahy Scale: Poor                             ADL either performed or assessed with clinical judgement   ADL Overall ADL's : Needs assistance/impaired Eating/Feeding: Set up   Grooming: Set up;Sitting   Upper Body Bathing: Set up;Sitting   Lower Body Bathing: Minimal assistance;Sit to/from stand   Upper Body Dressing : Set up;Sitting    Lower Body Dressing: Moderate assistance;Sit to/from stand   Toilet Transfer: Minimal assistance;Stand-pivot   Toileting- Clothing Manipulation and Hygiene: Total assistance Toileting - Clothing Manipulation Details (indicate cue type and reason): incontinenetn     Functional mobility during ADLs: Minimal assistance;Rolling walker;Cueing for safety General ADL Comments: Pt soaked in urine on entry     Vision   Additional Comments: will further assess; pt reports no change in vision     Perception Perception Comments: appears intact   Praxis Praxis Praxis tested?: Within functional limits    Pertinent Vitals/Pain Pain Assessment: No/denies pain     Hand Dominance Right   Extremity/Trunk Assessment Upper Extremity Assessment Upper Extremity Assessment: Generalized weakness (B RTC insufficiency)   Lower Extremity Assessment Lower Extremity Assessment: Defer to PT evaluation   Cervical / Trunk Assessment Cervical / Trunk Assessment: Normal   Communication Communication Communication: Expressive difficulties (slow speech, appears improved from admission)   Cognition Arousal/Alertness: Awake/alert Behavior During Therapy: Flat affect Overall Cognitive Status: No family/caregiver present to determine baseline cognitive functioning Area of Impairment: Memory;Safety/judgement;Awareness;Problem solving;Attention                   Current Attention Level: Selective Memory: Decreased short-term memory   Safety/Judgement: Decreased awareness of safety;Decreased awareness of deficits Awareness: Emergent Problem Solving: Slow processing     General  Comments       Exercises     Shoulder Instructions      Home Living Family/patient expects to be discharged to:: Private residence Living Arrangements: Alone Available Help at Discharge: Family;Available PRN/intermittently Type of Home: Apartment Home Access: Level entry     Home Layout: One level      Bathroom Shower/Tub: Chief Strategy Officer: Standard Bathroom Accessibility: Yes How Accessible: Accessible via walker Home Equipment: Walker - 4 wheels;Bedside commode;Shower seat   Additional Comments: Bed rail      Prior Functioning/Environment Level of Independence: Needs assistance  Gait / Transfers Assistance Needed: Ind amb mostly without an AD limited community distances with occasional use of a rollator when fatigued, no fall history, Mod Ind with bed mob with a rail and Ind with transfers ADL's / Homemaking Assistance Needed: Daughter visits patient daily and assists with bathing, dressing, errands, and groceries            OT Problem List: Decreased strength;Decreased activity tolerance;Impaired balance (sitting and/or standing);Decreased cognition;Decreased safety awareness;Decreased knowledge of use of DME or AE;Obesity      OT Treatment/Interventions: Self-care/ADL training;Therapeutic exercise;Neuromuscular education;Therapeutic activities;DME and/or AE instruction;Cognitive remediation/compensation;Patient/family education;Balance training    OT Goals(Current goals can be found in the care plan section) Acute Rehab OT Goals Patient Stated Goal: to go home with 24/7 nursing OT Goal Formulation: With patient Time For Goal Achievement: 10/23/20 Potential to Achieve Goals: Good  OT Frequency: Min 2X/week   Barriers to D/C:            Co-evaluation              AM-PAC OT "6 Clicks" Daily Activity     Outcome Measure Help from another person eating meals?: A Little Help from another person taking care of personal grooming?: A Little Help from another person toileting, which includes using toliet, bedpan, or urinal?: Total Help from another person bathing (including washing, rinsing, drying)?: A Little Help from another person to put on and taking off regular upper body clothing?: A Little Help from another person to put on and taking off  regular lower body clothing?: A Lot 6 Click Score: 15   End of Session Equipment Utilized During Treatment: Rolling walker Nurse Communication: Mobility status  Activity Tolerance: Patient tolerated treatment well Patient left: in chair;with call bell/phone within reach;with chair alarm set  OT Visit Diagnosis: Unsteadiness on feet (R26.81);Muscle weakness (generalized) (M62.81);Other symptoms and signs involving cognitive function                Time: 1430-1505 OT Time Calculation (min): 35 min Charges:  OT General Charges $OT Visit: 1 Visit OT Evaluation $OT Eval Moderate Complexity: 1 Mod OT Treatments $Self Care/Home Management : 8-22 mins  Luisa Dago, OT/L   Acute OT Clinical Specialist Acute Rehabilitation Services Pager [phone] Office [phone]   [facility] 10/09/2020, 3:19 PM

## 2020-10-09 NOTE — TOC Initial Note (Addendum)
Transition of Care [facility]) - Initial/Assessment Note    Patient Details  Name: Christian [name] MRN: [id number] Date of Birth: March 02, 1951  Transition [facility] Care [facility]) CM/SW Contact:    Baldemar Lenis, LCSW Phone Number: 10/09/2020, 11:53 AM  Clinical Narrative:     CSW spoke with patient's daughter/guardian, [name], about recommendation for SNF. [name] in agreement, agreeable to fax out and follow up with bed offers. Patient has received the Pfizer vaccine. CSW faxed out referral.             UPDATE 4:27 PM: CSW spoke with daughter, [name], about bed offers. [name] asked about [facility], [facility], or [facility]. CSW to reach out to facilities to review referral as referral is currently pending. CSW to follow.   Expected Discharge Plan: Skilled Nursing Facility Barriers to Discharge: Continued Medical Work up, English as a second language teacher, Engineer, mining)   Patient Goals and CMS Choice Patient states their goals for this hospitalization and ongoing recovery are:: guardian indicated goal to get rehab for the patient CMS Medicare.gov Compare Post Acute Care list provided to:: Legal Guardian Choice offered to / list presented to : [facility] POA / Guardian  Expected Discharge Plan and Services Expected Discharge Plan: Skilled Nursing Facility     Post Acute Care Choice: Skilled Nursing Facility Living arrangements for the past 2 months: Apartment                                      Prior Living Arrangements/Services Living arrangements for the past 2 months: Apartment Lives with:: Self Patient language and need for interpreter reviewed:: No Do you feel safe going back to the place where you live?: Yes      Need for Family Participation in Patient Care: Yes (Comment) Care giver support system in place?: No (comment)   Criminal Activity/Legal Involvement Pertinent to Current Situation/Hospitalization: No - Comment as needed  Activities of Daily  Living   ADL Screening (condition at time of admission) Is the patient deaf or have difficulty hearing?: No Does the patient have difficulty seeing, even when wearing glasses/contacts?: No Does the patient have difficulty concentrating, remembering, or making decisions?: Yes Does the patient have difficulty dressing or bathing?: Yes Does the patient have difficulty walking or climbing stairs?: Yes  Permission Sought/Granted Permission sought to share information with : Facility Medical sales representative, Family Supports Permission granted to share information with : Yes, Verbal Permission Granted  Share Information with NAME: [name]  Permission granted to share info w AGENCY: SNF  Permission granted to share info w Relationship: Daughter/Legal guardian     Emotional Assessment   Attitude/Demeanor/Rapport: Unable to Assess Affect (typically observed): Unable to Assess Orientation: : Oriented to Self, Oriented to  Time, Oriented to Place, Oriented to Situation Alcohol / Substance Use: Not Applicable Psych Involvement: Outpatient Provider  Admission diagnosis:  CVA (cerebral vascular accident) (HCC) [I63.9] AKI (acute kidney injury) (HCC) [N17.9] Altered mental status, unspecified altered mental status type [R41.82] Cerebral infarction, unspecified mechanism [facility]) [I63.9] Patient Active Problem List   Diagnosis Date Noted  . CVA (cerebral vascular accident) (HCC) 10/07/2020  . Type 2 diabetes mellitus with stage 3 chronic kidney disease (HCC) 04/01/2020  . CAD (coronary artery disease), native coronary artery 04/01/2020  . NSTEMI (non-ST elevated myocardial infarction) (HCC) 03/31/2020  . Obesity, Class III, BMI 40-49.9 (morbid obesity) (HCC) 03/31/2020  . Aspiration pneumonia of both lower lobes  due to gastric secretions (HCC) 03/21/2020  . Sacral decubitus ulcer, stage II (HCC) 03/19/2020  . Chronic diastolic CHF (congestive heart failure) (HCC) 03/17/2020  . Atrial flutter,  paroxysmal (HCC) 12/27/2019  . Constipation 09/16/2018  . Bipolar 1 disorder, depressed (HCC) 08/22/2018  . Encephalopathy   . Paroxysmal atrial fibrillation (HCC) 06/18/2018  . History of pulmonary embolism 06/17/2018  . Generalized weakness 03/27/2018  . Diabetes mellitus type 2 in nonobese (HCC) 03/27/2018  . Pulmonary embolism (HCC) 03/27/2018  . Fall   . Laceration of eyebrow   . Bipolar I disorder, most recent episode depressed, severe w psychosis (HCC) 12/22/2015  . Bipolar affective disorder, depressed, severe, with psychotic behavior (HCC)   . Bipolar I disorder, current or most recent episode depressed, with psychotic features (HCC)   . Severe bipolar I disorder with depression (HCC)   . Bipolar I disorder, most recent episode depressed, severe with psychotic features (HCC)   . Overdose 09/11/2015  . Acute respiratory failure with hypoxia (HCC) 09/05/2015  . Elevated troponin 09/05/2015  . Somnolence 09/05/2015  . Bipolar depression (HCC) 09/05/2015  . Acute bilateral deep vein thrombosis (DVT) of femoral veins (HCC) 09/05/2015  . Bilateral pulmonary embolism (HCC) 09/02/2015  . Labile hypertension   . Pulmonary embolism with acute cor pulmonale (HCC)   . Dyspnea   . Orthostatic hypotension   . Bipolar disorder, in partial remission, most recent episode depressed (HCC)   . Syncope, near 08/31/2015  . Affective psychosis, bipolar (HCC)   . Bipolar affective disorder, depressed, mild (HCC) 08/28/2015  . Pressure ulcer 07/30/2015  . Protein-calorie malnutrition, severe (HCC) 07/30/2015  . Dehydration 07/29/2015  . Diabetes (HCC) 07/09/2015  . UTI (urinary tract infection) 06/19/2014  . Positive blood culture 06/19/2014  . Type 2 diabetes mellitus with complication, without long-term current use of insulin (HCC) 06/02/2014  . Other and unspecified hyperlipidemia 06/02/2014  . OSA on CPAP 06/02/2014  . Bilateral lower extremity edema: chronic with venous stasis changes  06/02/2014  . Gout 06/02/2014  . Hypertension    PCP:  [name], MD Pharmacy:   [facility] [place] [place] - [address] [place] [address] Phone: [phone] Fax: [phone]     Social Determinants of Health (SDOH) Interventions    Readmission Risk Interventions No flowsheet data found.

## 2020-10-09 NOTE — Procedures (Addendum)
Patient Name: Christian [name]  MRN: [id number]  Epilepsy Attending: Charlsie Quest  Referring Physician/Provider: Dr Delia Heady Date: 10/08/2020 Duration: 25.15 mins  Patient history: 69 year old male with encephalopathy as well as speech disturbance. EEG to evaluate for seizures.  Level of alertness: Awake, drowsy  AEDs during EEG study: Depakote  Technical aspects: This EEG study was done with scalp electrodes positioned according to the 10-20 International system of electrode placement. Electrical activity was acquired at a sampling rate of 500Hz  and reviewed with a high frequency filter of 70Hz  and a low frequency filter of 1Hz . EEG data were recorded continuously and digitally stored.   Description: The posterior dominant rhythm consists of 8-9 Hz activity of moderate voltage (25-35 uV) seen predominantly in posterior head regions, symmetric and reactive to eye opening and eye closing. Drowsiness was characterized by attenuation of the posterior background rhythm.  Hyperventilation and photic stimulation were not performed.     IMPRESSION: This study is within normal limits. No seizures or epileptiform discharges were seen throughout the recording.  Salwa Bai 

## 2020-10-10 LAB — GLUCOSE, CAPILLARY
Glucose-Capillary: 95 mg/dL (ref 70–99)
Glucose-Capillary: 126 mg/dL — ABNORMAL HIGH (ref 70–99)
Glucose-Capillary: 169 mg/dL — ABNORMAL HIGH (ref 70–99)
Glucose-Capillary: 167 mg/dL — ABNORMAL HIGH (ref 70–99)

## 2020-10-10 LAB — RESPIRATORY PANEL BY RT PCR (FLU A&B, COVID)
Influenza A by PCR: NEGATIVE
Influenza B by PCR: NEGATIVE
SARS Coronavirus 2 by RT PCR: NEGATIVE

## 2020-10-10 MED ORDER — PANTOPRAZOLE SODIUM 40 MG PO TBEC
40.0000 mg | DELAYED_RELEASE_TABLET | Freq: Every day | ORAL | Status: DC
Start: 2020-10-10 — End: 2021-04-02

## 2020-10-10 MED ORDER — INSULIN ASPART 100 UNIT/ML ~~LOC~~ SOLN
SUBCUTANEOUS | 11 refills | Status: DC
Start: 2020-10-10 — End: 2021-04-06

## 2020-10-10 NOTE — TOC Progression Note (Addendum)
Transition of Care [facility]) - Progression Note    Patient Details  Name: Christian [name] MRN: [id number] Date of Birth: 1951/05/14  Transition of Care [facility]) CM/SW Contact  Baldemar Lenis, [place] Phone Number: 10/10/2020, 2:32 PM  Clinical Narrative:   After patient discharge was complete, [facility] called CSW that they saw a note where the patient had a bed bug taken off of him in the ED. CSW asked the nurse about any other bed bugs or bite marks, and there have been no more bugs and the patient's skin is clear of bite marks. However, [facility] is unable to accept the patient due to the possibility of bed bugs. CSW contacted patient's daughter to update her, and she would now like to go with [facility] for SNF. CSW contacted [facility] to discuss and ask about bed availability, awaiting call back. CSW updated RN and canceled PTAR.  UPDATE 4:21 PM: [facility] has rescinded bed offer on patient due to the bed bug found in the ED. CSW contacted [facility], and they are still able to offer a bed, but the patient will not be able to bring any personal belongings with him. Bed will not be available until Monday. CSW updated patient's daughter, [name], who is in agreement. CSW to follow.    Expected Discharge Plan: Skilled Nursing Facility Barriers to Discharge: Other (comment)  Expected Discharge Plan and Services Expected Discharge Plan: Skilled Nursing Facility     Post Acute Care Choice: Skilled Nursing Facility Living arrangements for the past 2 months: Apartment Expected Discharge Date: 10/10/20                                     Social Determinants of Health (SDOH) Interventions    Readmission Risk Interventions No flowsheet data found.

## 2020-10-10 NOTE — Progress Notes (Signed)
Physical Therapy Treatment Patient Details Name: Christian [name] MRN: [id number] DOB: 12-20-1950 Today's Date: 10/10/2020    History of Present Illness Patient is a 69 y.o. male CAD s/p PCI, PAF on Xarelto, DM-2, HTN, HLD, CKD stage IIIa, bipolar disorder, OSA on CPAP-presented to the ED with aphasia. MRI brain negative for acute intracranial abnormality. MRI of the brain shows diminished flow in the right vertebral artery; encephalopathy.     PT Comments    Pt limited in participation by lethargy this date. He would repeatedly close his eyes and as a result lose his balance when sitting on the EOB. He required increased assistance of modA to come to stand successfully 1x this date and maintain static standing balance with bilateral UE support on the RW. He fatigued quickly and requested to return to sit and supine in bed. Pt declined attempts to ambulate or perform therapeutic exercise this date. Will continue to follow acutely. Current recommendations remain appropriate.  Follow Up Recommendations  SNF;Supervision/Assistance - 24 hour     Equipment Recommendations  None recommended by PT    Recommendations for Other Services       Precautions / Restrictions Precautions Precautions: Fall Precaution Comments: contact precautions Restrictions Weight Bearing Restrictions: No    Mobility  Bed Mobility Overal bed mobility: Needs Assistance Bed Mobility: Supine to Sit;Sit to Supine     Supine to sit: Min assist;HOB elevated Sit to supine: Mod assist   General bed mobility comments: Extra time and VCs provided to manage LEs off L EOB and cues for hand placement on bed rail, minA to ascend trunk. ModA to manage LEs when returning to supine.  Transfers Overall transfer level: Needs assistance Equipment used: Rolling walker (2 wheeled) Transfers: Sit to/from Stand Sit to Stand: Mod assist         General transfer comment: Unsuccessful on 1st attempt to come to stand due to  being lethargic. Successful with modAx1 to come to stand on 2nd attempt, with cues for hand placement and therapist anterior to pt to assist with extending hips and transitioning hands to RW.  Ambulation/Gait             General Gait Details: Pt declined this date   Stairs             Wheelchair Mobility    Modified Rankin (Stroke Patients Only)       Balance Overall balance assessment: Needs assistance Sitting-balance support: Bilateral upper extremity supported;Feet supported Sitting balance-Leahy Scale: Poor Sitting balance - Comments: B UE support and min guard assist with intermittent minA to maintain static sitting balance with pt closing eyes due to lethargy and losing balance.   Standing balance support: Bilateral upper extremity supported Standing balance-Leahy Scale: Poor Standing balance comment: ModAx1 and cues to extend hips to maintain static standing balance with use of RW, x1 bout of ~30 sec.                            Cognition Arousal/Alertness: Lethargic Behavior During Therapy: Flat affect Overall Cognitive Status: No family/caregiver present to determine baseline cognitive functioning Area of Impairment: Memory;Safety/judgement;Awareness;Problem solving;Attention                   Current Attention Level: Selective Memory: Decreased short-term memory   Safety/Judgement: Decreased awareness of safety;Decreased awareness of deficits Awareness: Emergent Problem Solving: Slow processing;Decreased initiation;Difficulty sequencing;Requires verbal cues;Requires tactile cues General Comments: Flat affect and lathergic throughout  session, requiring repeated cues to remain awake and attentive to tasks. Extra time to process and respond to cues.      Exercises      General Comments        Pertinent Vitals/Pain Pain Assessment: No/denies pain    Home Living                      Prior Function            PT  Goals (current goals can now be found in the care plan section) Acute Rehab PT Goals Patient Stated Goal: to go to sleep PT Goal Formulation: With patient Time For Goal Achievement: 10/22/20 Potential to Achieve Goals: Good Progress towards PT goals: Not progressing toward goals - comment (limited by lethargy this date)    Frequency    Min 3X/week      PT Plan Current plan remains appropriate    Co-evaluation              AM-PAC PT "6 Clicks" Mobility   Outcome Measure  Help needed turning from your back to your side while in a flat bed without using bedrails?: A Little Help needed moving from lying on your back to sitting on the side of a flat bed without using bedrails?: A Little Help needed moving to and from a bed to a chair (including a wheelchair)?: A Lot Help needed standing up from a chair using your arms (e.g., wheelchair or bedside chair)?: A Lot Help needed to walk in hospital room?: A Lot Help needed climbing 3-5 steps with a railing? : A Lot 6 Click Score: 14    End of Session Equipment Utilized During Treatment: Gait belt Activity Tolerance: Patient limited by lethargy Patient left: in bed;with call bell/phone within reach;with bed alarm set Nurse Communication: Mobility status PT Visit Diagnosis: Unsteadiness on feet (R26.81);Difficulty in walking, not elsewhere classified (R26.2);Muscle weakness (generalized) (M62.81);Other symptoms and signs involving the nervous system (R29.898)     Time: 5009-3818 PT Time Calculation (min) (ACUTE ONLY): 18 min  Charges:  $Therapeutic Activity: 8-22 mins                     Raymond Gurney, PT, DPT Acute Rehabilitation Services  Pager: [phone] Office: [phone]    Jewel Baize 10/10/2020, 3:09 PM

## 2020-10-10 NOTE — TOC Transition Note (Signed)
Transition of Care [facility]) - CM/SW Discharge Note   Patient Details  Name: DANFORD TAT MRN: [id number] Date of Birth: 1951/03/11  Transition of Care [facility]) CM/SW Contact:  Baldemar Lenis, LCSW Phone Number: 10/10/2020, 1:52 PM   Clinical Narrative:   Nurse to call report to [phone], Room 212    Final next level of care: Skilled Nursing Facility Barriers to Discharge: Barriers Resolved   Patient Goals and CMS Choice Patient states their goals for this hospitalization and ongoing recovery are:: guardian indicated goal to get rehab for the patient CMS Medicare.gov Compare Post Acute Care list provided to:: Legal Guardian Choice offered to / list presented to : [facility] POA / Guardian  Discharge Placement              Patient chooses bed at: [facility] Patient to be transferred to facility by: PTAR Name of family member notified: [name] Patient and family notified of of transfer: 10/10/20  Discharge Plan and Services     Post Acute Care Choice: Skilled Nursing Facility                               Social Determinants of Health (SDOH) Interventions     Readmission Risk Interventions No flowsheet data found.

## 2020-10-10 NOTE — Discharge Summary (Signed)
PATIENT DETAILS Name: Christian [name] Age: 69 y.o. Sex: male Date of Birth: August 13, 1951 MRN: [id number]. Admitting Physician: Emeline General, MD PCP:Sun, Charise Carwin, MD  Admit Date: 10/07/2020 Discharge date: 10/10/2020  Recommendations for Outpatient Follow-up:  1. Follow up with PCP in 1-2 weeks 2. Please obtain CMP/CBC in one week 3. Please ensure follow-up with patient's primary cardiologist  Admitted From:  Home  Disposition: SNF   Home Health: No  Equipment/Devices: None  Discharge Condition: Stable  CODE STATUS: FULL CODE  Diet recommendation:  Diet Order            Diet - low sodium heart healthy           Diet Carb Modified           Diet heart healthy/carb modified Room service appropriate? Yes; Fluid consistency: Thin  Diet effective now                  Brief Summary: Patient is a 69 y.o. male CAD s/p PCI, PAF on Xarelto, DM-2, HTN, HLD, CKD stage IIIa, bipolar disorder, OSA on CPAP-presented to the ED after he had difficulty formulating sentences.  He was subsequently admitted for further evaluation and treatment.  Significant events: 11/9>> admit with aphasia  Significant studies: 11/9>> chest x-ray: No pneumonia. 11/9>> CT head: No acute intracranial process 11/9>> MRI brain: No acute intracranial abnormality. 11/9>> MRI brain: Chronically advanced intracranial atherosclerosis. 11/10>> Echo: EF 65-70%, grade 1 diastolic dysfunction. 11/10>> carotid Doppler: No significant stenosis 11/10>> LDL: 81 11/10>> A1c 11.1 11/10>> EEG: No seizures 11/11>> CT head: No evidence of acute intracranial abnormality.  Antimicrobial therapy: None  Microbiology data: None  Procedures : 11/10>> EEG:  No seizures.  Consults: Neurology   Brief Hospital Course: Aphasia: Speech has significantly improved overnight-MRI brain on 11/9 - for CVA-CT head repeated on 11/11 morning-which does not show any acute abnormalities.  Suspect in  retrospect-that this probably was more from encephalopathy from AKI.  EEG was negative for seizures-no further recommendations from neurology.  Acute metabolic encephalopathy: Likely secondary to AKI-no indication of infection-UA/chest x-ray negative.  Afebrile.  Significantly better-suspect mental status is now close to baseline.  AKI on CKD stage IIIa: AKI likely hemodynamically mediated-improved with supportive care-creatinine has plateaued around 1.92-1.98 range.  Continue to avoid nephrotoxic agents-repeat electrolytes periodically.  Follow-up with primary care practitioner-consider outpatient referral to nephrology.  DM-2: CBGs were controlled with SSI-resume glimepiride and Jardiance on discharge.  HTN: BP stable-continue metoprolol.  Hypothyroidism: Continue Cytomel.  TSH within normal limits.  CAD s/p PCI to LAD on 04/29/2020: Reviewed outpatient cardiology note-continue Plavix.  PAF: Currently in sinus rhythm-continue Xarelto and metoprolol.  Note-appears to be on both Plavix and Xarelto-being followed by cardiology in the outpatient setting-defer further adjustments to his primary cardiologist at [facility] (spoke at length regarding this with patient's daughter).  Bipolar disorder: Continue Seroquel,Wellbutrin and Depakote.  OSA: CPAP nightly.  Obesity: Estimated body mass index is 38.11 kg/m as calculated from the following:   Height as of this encounter: 6' (1.829 m).   Weight as of this encounter: 127.5 kg.    RN pressure injury documentation: Pressure Injury 08/04/18 Stage II -  Partial thickness loss of dermis presenting as a shallow open ulcer with a red, pink wound bed without slough. (Active)  08/04/18 2253  Location: Buttocks  Location Orientation: Left;Right  Staging: Stage II -  Partial thickness loss of dermis presenting as a shallow open ulcer with a red, pink wound  bed without slough.  Wound Description (Comments):   Present on Admission: Yes      Discharge Diagnoses:  Aphasia Acute metabolic encephalopathy AKI on CKD stage IIIa HTN CAD PAF   Discharge Instructions:  Activity:  As tolerated with Full fall precautions use walker/cane & assistance as needed  Discharge Instructions    Call MD for:  difficulty breathing, headache or visual disturbances   Complete by: As directed    Call MD for:  extreme fatigue   Complete by: As directed    Call MD for:  persistant dizziness or light-headedness   Complete by: As directed    Diet - low sodium heart healthy   Complete by: As directed    Diet Carb Modified   Complete by: As directed    Discharge instructions   Complete by: As directed    Follow with Primary MD  [name], MD in 1-2 weeks  Please follow-up with your primary cardiologist in 1-2 weeks.  Please get a complete blood count and chemistry panel checked by your Primary MD at your next visit, and again as instructed by your Primary MD.  Get Medicines reviewed and adjusted: Please take all your medications with you for your next visit with your Primary MD  Laboratory/radiological data: Please request your Primary MD to go over all hospital tests and procedure/radiological results at the follow up, please ask your Primary MD to get all Hospital records sent to his/her office.  In some cases, they will be blood work, cultures and biopsy results pending at the time of your discharge. Please request that your primary care M.D. follows up on these results.  Also Note the following: If you experience worsening of your admission symptoms, develop shortness of breath, life threatening emergency, suicidal or homicidal thoughts you must seek medical attention immediately by calling 911 or calling your MD immediately  if symptoms less severe.  You must read complete instructions/literature along with all the possible adverse reactions/side effects for all the Medicines you take and that have been prescribed to you.  Take any new Medicines after you have completely understood and accpet all the possible adverse reactions/side effects.   Do not drive when taking Pain medications or sleeping medications (Benzodaizepines)  Do not take more than prescribed Pain, Sleep and Anxiety Medications. It is not advisable to combine anxiety,sleep and pain medications without talking with your primary care practitioner  Special Instructions: If you have smoked or chewed Tobacco  in the last 2 yrs please stop smoking, stop any regular Alcohol  and or any Recreational drug use.  Wear Seat belts while driving.  Please note: You were cared for by a hospitalist during your hospital stay. Once you are discharged, your primary care physician will handle any further medical issues. Please note that NO REFILLS for any discharge medications will be authorized once you are discharged, as it is imperative that you return to your primary care physician (or establish a relationship with a primary care physician if you do not have one) for your post hospital discharge needs so that they can reassess your need for medications and monitor your lab values.   Check CBGs before meals and at bedtime.   Increase activity slowly   Complete by: As directed      Allergies as of 10/10/2020      Reactions   Amlodipine Swelling, Other (See Comments)   Legs became swollen   Onglyza [saxagliptin] Other (See Comments)   "Allergic," per [facility]   Zyprexa [olanzapine] Other (See Comments)   Allergy noted by ACT Team. No reaction specified (??)- "Allergic," per [facility]      Medication List    STOP taking these medications   amLODipine 10 MG tablet Commonly known as: NORVASC   aspirin 81 MG EC tablet   doxycycline 100 MG tablet Commonly known as: VIBRA-TABS   furosemide 40 MG tablet Commonly known as: LASIX   lisinopril 40 MG tablet Commonly known as: ZESTRIL     TAKE these medications   acetaminophen 500 MG  tablet Commonly known as: TYLENOL Take 500-1,000 mg by mouth every 8 (eight) hours as needed for mild pain or headache.   ARIPiprazole 20 MG tablet Commonly known as: ABILIFY Take 1 tablet (20 mg total) by mouth daily.   atorvastatin 10 MG tablet Commonly known as: LIPITOR Take 1 tablet (10 mg total) by mouth daily at 6 PM. What changed: when to take this   buPROPion 75 MG tablet Commonly known as: WELLBUTRIN Take 1 tablet (75 mg total) by mouth daily.   clopidogrel 75 MG tablet Commonly known as: PLAVIX Take 75 mg by mouth daily.   divalproex 500 MG DR tablet Commonly known as: DEPAKOTE Take 1 tablet (500 mg total) by mouth every 12 (twelve) hours.   empagliflozin 10 MG Tabs tablet Commonly known as: JARDIANCE Take 10 mg by mouth daily.   glimepiride 2 MG tablet Commonly known as: AMARYL Take 2 mg by mouth daily with breakfast.   insulin aspart 100 UNIT/ML injection Commonly known as: novoLOG 0-15 Units, Subcutaneous, 3 times daily with meals CBG < 70: Implement Hypoglycemia measures CBG 70 - 120: 0 units CBG 121 - 150: 2 units CBG 151 - 200: 3 units CBG 201 - 250: 5 units CBG 251 - 300: 8 units CBG 301 - 350: 11 units CBG 351 - 400: 15 units CBG > 400: call MD   Lantus SoloStar 100 UNIT/ML Solostar Pen Generic drug: insulin glargine Inject 6-7 Units into the skin See admin instructions. Inject 6 units into the skin in the morning and increase to 7 units if BGL is 150 or greater   liothyronine 25 MCG tablet Commonly known as: CYTOMEL Take 25 mcg by mouth daily before breakfast.   metoprolol succinate 50 MG 24 hr tablet Commonly known as: TOPROL-XL Take 1 tablet (50 mg total) by mouth daily. Take with or immediately following a meal.   mirtazapine 15 MG disintegrating tablet Commonly known as: REMERON SOL-TAB Take 1 tablet (15 mg total) by mouth at bedtime.   pantoprazole 40 MG tablet Commonly known as: PROTONIX Take 1 tablet (40 mg total) by mouth  daily at 12 noon.   polyethylene glycol powder 17 GM/SCOOP powder Commonly known as: GLYCOLAX/MIRALAX Take 17 g by mouth See admin instructions. Dissolve 17 grams of powder into 4-8 ounces of water and drink every one to two days   QUEtiapine 25 MG tablet Commonly known as: SEROQUEL Take 25 mg by mouth 2 (two) times daily.   QUEtiapine 300 MG tablet Commonly known as: SEROQUEL Take 300 mg by mouth at bedtime.   rivaroxaban 20 MG Tabs tablet Commonly known as: XARELTO Take 1 tablet (20 mg total) by mouth daily with supper. What changed: when to take this       Allergies  Allergen Reactions  . Amlodipine Swelling and Other (See Comments)    Legs became swollen  . Onglyza [Saxagliptin] Other (See Comments)    "Allergic,"  per [facility]  . Zyprexa [Olanzapine] Other (See Comments)    Allergy noted by ACT Team. No reaction specified (??)- "Allergic," per [facility]       Other Procedures/Studies: CT Head Wo Contrast  Result Date: 10/09/2020 CLINICAL DATA:  Stroke, follow-up. EXAM: CT HEAD WITHOUT CONTRAST TECHNIQUE: Contiguous axial images were obtained from the base of the skull through the vertex without intravenous contrast. COMPARISON:  MRI/MRA head 10/07/2020.  Head CT 10/07/2020. FINDINGS: Brain: Mild generalized cerebral atrophy. Minimal ill-defined hypoattenuation within the cerebral white matter is nonspecific, but compatible with chronic small vessel ischemic disease. There is no acute intracranial hemorrhage. No demarcated cortical infarct is identified. No extra-axial fluid collection. No evidence of intracranial mass. No midline shift. Vascular: No hyperdense vessel.  Atherosclerotic calcifications. Skull: Normal. Negative for fracture or focal lesion. Sinuses/Orbits: Visualized orbits show no acute finding. Mild ethmoid sinus mucosal thickening. Other: Small right mastoid effusion. IMPRESSION: No evidence of acute intracranial abnormality. Mild  cerebral atrophy and chronic small vessel ischemic disease. Mild ethmoid sinus mucosal thickening. Small right mastoid effusion. Electronically Signed   By: Jackey Loge DO   On: 10/09/2020 07:31   CT HEAD WO CONTRAST  Result Date: 10/07/2020 CLINICAL DATA:  Neurologic deficit EXAM: CT HEAD WITHOUT CONTRAST TECHNIQUE: Contiguous axial images were obtained from the base of the skull through the vertex without intravenous contrast. COMPARISON:  08/22/2018 FINDINGS: Brain: No acute infarct or hemorrhage. Lateral ventricles and midline structures are unremarkable. No acute extra-axial fluid collections. No mass effect. Vascular: No hyperdense vessel. There is severe atherosclerosis of the bilateral internal carotid arteries, stable. Skull: Normal. Negative for fracture or focal lesion. Sinuses/Orbits: Minimal fluid within the right mastoid air cells. Paranasal sinuses are clear. Other: None. IMPRESSION: 1. No acute intrathoracic process. 2. Minimal fluid within the right mastoid air cells. 3. Stable atherosclerosis. Electronically Signed   By: Sharlet Salina M.D.   On: 10/07/2020 17:46   MR ANGIO HEAD WO CONTRAST  Result Date: 10/07/2020 CLINICAL DATA:  69 year old male with altered mental status, neurologic deficit, TIA. EXAM: MRA HEAD WITHOUT CONTRAST TECHNIQUE: Angiographic images of the Circle of Willis were obtained using MRA technique without intravenous contrast. COMPARISON:  Brain MRI today.  Intracranial MRA 03/07/2011. FINDINGS: Severe stenosis of the diminutive distal right vertebral artery demonstrated in 2012, now with essentially absent flow signal in the right V4 segment. The distal left vertebral artery remains patent. Visible left PICA remains patent. The left vertebral supplies the basilar with moderate to severe increased stenosis at the left vertebrobasilar junction (series 1052, image 11. There is minimal reconstitution of the right vertebrobasilar junction. Patent basilar artery with  chronic mild irregularity and stenosis. SCA and left PCA origins remain patent. Fetal right PCA origin again noted. Diminutive or absent left posterior communicating artery. Moderate to severe bilateral PCA P2 segment stenoses are chronic (image 1046 image 18 and 19) and not significantly changed. Preserved distal PCA flow signal. Antegrade flow in both ICA siphons. Severe bilateral distal ICA irregularity and stenosis, on the left just proximal to the ICA terminus (series 1040, image 7) and on the right at the anterior genu (same image). These have substantially progressed since 2012. Carotid termini remain patent. Mild to moderate right ACA origin stenosis is increased. Anterior communicating artery and visible ACA branches are otherwise within normal limits. Left MCA M1 mild irregularity and stenosis has not significantly changed. Left MCA trifurcation remains patent. Visible left MCA branches are stable. Moderate chronic right  MCA M1 stenosis is stable (series 10 40 image 5). Mild to moderate stenosis also at the right MCA bifurcation is stable (image 13). Visible right MCA branches appear stable. IMPRESSION: 1. Chronically advanced intracranial atherosclerosis. 2. The following have progressed since a 2012 MRA: - occlusion of the non-dominant distal Right Vertebral Artery. - Moderate to Severe stenosis at the Left Vertebrobasilar Junction. - Severe stenosis of both ICA siphons (right anterior genu and left supraclinoid segment). - mild to moderate Right ACA origin stenosis. 3. The following are stable since 2012: - Moderate Right MCA M1 stenosis. - Moderate to Severe bilateral PCA P2 stenoses. Electronically Signed   By: Odessa Fleming M.D.   On: 10/07/2020 19:34   MR BRAIN WO CONTRAST  Result Date: 10/07/2020 CLINICAL DATA:  69 year old male with altered mental status, neurologic deficit, TIA. EXAM: MRI HEAD WITHOUT CONTRAST TECHNIQUE: Multiplanar, multiecho pulse sequences of the brain and surrounding structures  were obtained without intravenous contrast. COMPARISON:  Head CT earlier today.  Brain MRI 03/07/2011. FINDINGS: Brain: No restricted diffusion to suggest acute infarction. No midline shift, mass effect, evidence of mass lesion, ventriculomegaly, extra-axial collection or acute intracranial hemorrhage. Cervicomedullary junction and pituitary are within normal limits. Mild for age vague, patchy cerebral white matter T2 and FLAIR hyperintensity is stable. No cortical encephalomalacia identified. Chronic lacunar infarct medial left pons is subtle and was better visualized in 2012. No chronic cerebral blood products identified. Deep gray matter nuclei and cerebellum remain within normal limits. Vascular: Major intracranial vascular flow voids appear stable since 2012 including decreased distal right vertebral artery flow void (series 15, image 3 today). Generalized intracranial artery tortuosity again noted. Skull and upper cervical spine: Visible bone marrow signal and cervical spine appear stable since 2012. Sinuses/Orbits: Stable, negative.  Hyperplastic paranasal sinuses. Other: Mild chronic mastoid effusions, regressed on the left but increased on the right since 2012. Still, significance is doubtful. Other visible internal auditory structures appear normal. Visible scalp and face appear negative. IMPRESSION: 1. No acute intracranial abnormality. 2. Mild for age chronic small vessel disease, not significantly changed since 2012. 3. Evidence of chronically decreased flow in the distal right vertebral artery. See also MRA today reported separately. Electronically Signed   By: Odessa Fleming M.D.   On: 10/07/2020 19:25   US RENAL  Result Date: 10/07/2020 CLINICAL DATA:  Acute kidney injury EXAM: RENAL / URINARY TRACT ULTRASOUND COMPLETE COMPARISON:  Renal ultrasound 07/31/2014, CT abdomen pelvis 08/01/2014 FINDINGS: Right Kidney: Renal measurements: 11.2 x 5.4 x 4.4 cm = volume: 139 mL. Echogenicity is within normal  limits. No concerning renal mass, shadowing calculus or hydronephrosis. Left Kidney: Renal measurements: 11.9 x 6.1 x 5.8 cm = volume: 219 mL. Echogenicity is within normal limits. No concerning renal mass, shadowing calculus or hydronephrosis. Bladder: Moderate bladder distention without wall thickening, calculi or debris. Other: None. IMPRESSION: Moderate bladder distension without other concerning bladder abnormality. Otherwise unremarkable renal ultrasound. Electronically Signed   By: Kreg Shropshire M.D.   On: 10/07/2020 19:35   DG Chest Portable 1 View  Result Date: 10/07/2020 CLINICAL DATA:  Slurred speech EXAM: PORTABLE CHEST 1 VIEW COMPARISON:  Mar 31, 2020 FINDINGS: No edema or airspace opacity. Heart is slightly enlarged with pulmonary vascularity normal. No adenopathy. No bone lesions. IMPRESSION: Heart slightly enlarged.  No edema or airspace opacity. Electronically Signed   By: Bretta Bang III M.D.   On: 10/07/2020 15:01   EEG adult  Result Date: 10/09/2020 Charlsie Quest, MD  10/09/2020 11:06 AM Patient Name: Christian [name] Epilepsy Attending: Charlsie Quest Referring Physician/Provider: Dr Delia Heady Date: 10/08/2020 Duration: 25.15 mins Patient history: 69 year old male with encephalopathy as well as speech disturbance. EEG to evaluate for seizures. Level of alertness: Awake, drowsy AEDs during EEG study: Depakote Technical aspects: This EEG study was done with scalp electrodes positioned according to the 10-20 International system of electrode placement. Electrical activity was acquired at a sampling rate of 500Hz  and reviewed with a high frequency filter of 70Hz  and a low frequency filter of 1Hz . EEG data were recorded continuously and digitally stored. Description: The posterior dominant rhythm consists of 8-9 Hz activity of moderate voltage (25-35 uV) seen predominantly in posterior head regions, symmetric and reactive to eye opening and eye closing.  Drowsiness was characterized by attenuation of the posterior background rhythm.  Hyperventilation and photic stimulation were not performed.   IMPRESSION: This study is within normal limits. No seizures or epileptiform discharges were seen throughout the recording. Charlsie Quest   ECHOCARDIOGRAM COMPLETE  Result Date: 10/08/2020    ECHOCARDIOGRAM REPORT   Patient Name:   NAIM MURTHA Date of Exam: 10/08/2020 Medical Rec #:  [id number]        Height:       72.0 in Accession #:    [id number]       Weight:       281.0 lb Date of Birth:  [birth year]        BSA:          2.462 m Patient Age:    41 years         BP:           123/72 mmHg Patient Gender: M                HR:           86 bpm. Exam Location:  Inpatient Procedure: 2D Echo and Intracardiac Opacification Agent Indications:    TIA 435.9 / G45.9  History:        Patient has prior history of Echocardiogram examinations, most                 recent 03/18/2020. Risk Factors:Hypertension, Diabetes and Sleep                 Apnea. Chronic kidney disease.  Sonographer:    Leta Jungling RDCS Referring Phys: [id number] Emeline General  Sonographer Comments: Technically difficult study due to poor echo windows. IMPRESSIONS  1. Left ventricular ejection fraction, by estimation, is 65 to 70%. The left ventricle has normal function. The left ventricle has no regional wall motion abnormalities. There is mild left ventricular hypertrophy. Left ventricular diastolic parameters are consistent with Grade I diastolic dysfunction (impaired relaxation).  2. Right ventricular systolic function is normal. The right ventricular size is normal.  3. The mitral valve is normal in structure. No evidence of mitral valve regurgitation. No evidence of mitral stenosis. Severe mitral annular calcification.  4. The aortic valve is tricuspid. There is moderate calcification of the aortic valve. There is moderate thickening of the aortic valve. Aortic valve regurgitation is not visualized.  Mild to moderate aortic valve sclerosis/calcification is present, without any evidence of aortic stenosis.  5. The inferior vena cava is normal in size with greater than 50% respiratory variability, suggesting right atrial pressure of 3 mmHg. Conclusion(s)/Recommendation(s): No intracardiac source of embolism detected on this transthoracic study. A transesophageal echocardiogram is recommended to  exclude cardiac source of embolism if clinically indicated. FINDINGS  Left Ventricle: Left ventricular ejection fraction, by estimation, is 65 to 70%. The left ventricle has normal function. The left ventricle has no regional wall motion abnormalities. Definity contrast agent was given IV to delineate the left ventricular  endocardial borders. The left ventricular internal cavity size was normal in size. There is mild left ventricular hypertrophy. Left ventricular diastolic parameters are consistent with Grade I diastolic dysfunction (impaired relaxation). Right Ventricle: The right ventricular size is normal. No increase in right ventricular wall thickness. Right ventricular systolic function is normal. Left Atrium: Left atrial size was normal in size. Right Atrium: Right atrial size was normal in size. Pericardium: There is no evidence of pericardial effusion. Mitral Valve: The mitral valve is normal in structure. There is moderate thickening of the mitral valve leaflet(s). There is moderate calcification of the mitral valve leaflet(s). Severe mitral annular calcification. No evidence of mitral valve regurgitation. No evidence of mitral valve stenosis. Tricuspid Valve: The tricuspid valve is normal in structure. Tricuspid valve regurgitation is not demonstrated. No evidence of tricuspid stenosis. Aortic Valve: The aortic valve is tricuspid. There is moderate calcification of the aortic valve. There is moderate thickening of the aortic valve. Aortic valve regurgitation is not visualized. Mild to moderate aortic valve  sclerosis/calcification is present, without any evidence of aortic stenosis. Pulmonic Valve: The pulmonic valve was normal in structure. Pulmonic valve regurgitation is not visualized. No evidence of pulmonic stenosis. Aorta: The aortic root is normal in size and structure. Venous: The inferior vena cava is normal in size with greater than 50% respiratory variability, suggesting right atrial pressure of 3 mmHg. IAS/Shunts: No atrial level shunt detected by color flow Doppler.  LEFT VENTRICLE PLAX 2D LVIDd:         4.20 cm      Diastology LVIDs:         3.05 cm      LV e' medial:    5.00 cm/s LV PW:         1.00 cm      LV E/e' medial:  12.9 LV IVS:        1.20 cm      LV e' lateral:   6.09 cm/s LVOT diam:     2.05 cm      LV E/e' lateral: 10.6 LV SV:         50 LV SV Index:   20 LVOT Area:     3.30 cm  LV Volumes (MOD) LV vol d, MOD A2C: 78.6 ml LV vol d, MOD A4C: 172.0 ml LV vol s, MOD A2C: 31.1 ml LV vol s, MOD A4C: 46.9 ml LV SV MOD A2C:     47.5 ml LV SV MOD A4C:     172.0 ml LV SV MOD BP:      82.0 ml RIGHT VENTRICLE TAPSE (M-mode): 2.3 cm LEFT ATRIUM             Index       RIGHT ATRIUM           Index LA diam:        3.50 cm 1.42 cm/m  RA Area:     14.60 cm LA Vol (A2C):   43.0 ml 17.47 ml/m RA Volume:   30.30 ml  12.31 ml/m LA Vol (A4C):   42.3 ml 17.18 ml/m LA Biplane Vol: 44.1 ml 17.91 ml/m  AORTIC VALVE LVOT Vmax:   91.60 cm/s LVOT Vmean:  63.800 cm/s LVOT VTI:    0.150 m  AORTA Ao Root diam: 3.30 cm Ao Asc diam:  3.40 cm MITRAL VALVE MV Area (PHT): 4.54 cm    SHUNTS MV Decel Time: 167 msec    Systemic VTI:  0.15 m MV E velocity: 64.50 cm/s  Systemic Diam: 2.05 cm MV A velocity: 90.30 cm/s MV E/A ratio:  0.71 Donato Schultz MD Electronically signed by Donato Schultz MD Signature Date/Time: 10/08/2020/11:31:40 AM    Final    VAS US CAROTID  Result Date: 10/09/2020 Carotid Arterial Duplex Study Comparison Study:  09/02/2015- carotid artery duplex Performing Technologist: Gertie Fey MHA, RDMS,  RVT, RDCS  Examination Guidelines: A complete evaluation includes B-mode imaging, spectral Doppler, color Doppler, and power Doppler as needed of all accessible portions of each vessel. Bilateral testing is considered an integral part of a complete examination. Limited examinations for reoccurring indications may be performed as noted.  Right Carotid Findings: +----------+--------+--------+--------+--------------------------+--------+           PSV cm/sEDV cm/sStenosisPlaque Description        Comments +----------+--------+--------+--------+--------------------------+--------+ CCA Prox  93      21                                                 +----------+--------+--------+--------+--------------------------+--------+ CCA Distal107     24              smooth and heterogenous            +----------+--------+--------+--------+--------------------------+--------+ ICA Prox  117     19              heterogenous and irregular         +----------+--------+--------+--------+--------------------------+--------+ ICA Distal75      31                                                 +----------+--------+--------+--------+--------------------------+--------+ ECA       263     24              heterogenous and irregular         +----------+--------+--------+--------+--------------------------+--------+ +----------+--------+-------+----------------+-------------------+           PSV cm/sEDV cmsDescribe        Arm Pressure (mmHG) +----------+--------+-------+----------------+-------------------+ [id number]            Multiphasic, WNL                    +----------+--------+-------+----------------+-------------------+ +---------+--------+--+--------+---------+ VertebralPSV cm/s34EDV cm/sAntegrade +---------+--------+--+--------+---------+  Left Carotid Findings: +----------+--------+--------+--------+-----------------------+--------+           PSV cm/sEDV  cm/sStenosisPlaque Description     Comments +----------+--------+--------+--------+-----------------------+--------+ CCA Prox  159     26              smooth and heterogenous         +----------+--------+--------+--------+-----------------------+--------+ CCA Distal171     21              smooth and heterogenous         +----------+--------+--------+--------+-----------------------+--------+ ICA Prox  62      11              smooth and heterogenous         +----------+--------+--------+--------+-----------------------+--------+  ICA Distal72      23                                              +----------+--------+--------+--------+-----------------------+--------+ ECA       185     17                                              +----------+--------+--------+--------+-----------------------+--------+ +----------+--------+--------+----------------+-------------------+           PSV cm/sEDV cm/sDescribe        Arm Pressure (mmHG) +----------+--------+--------+----------------+-------------------+ [id number]              Multiphasic, WNL                    +----------+--------+--------+----------------+-------------------+ +---------+--------+--+--------+--+---------+ VertebralPSV cm/s39EDV cm/s11Antegrade +---------+--------+--+--------+--+---------+   Summary: Right Carotid: Velocities in the right ICA are consistent with a 1-39% stenosis.                The ECA appears >50% stenosed. Left Carotid: Velocities in the left ICA are consistent with a 1-39% stenosis. Vertebrals:  Bilateral vertebral arteries demonstrate antegrade flow. Right              vertebral artery demonstrates high resistant flow. Subclavians: Normal flow hemodynamics were seen in bilateral subclavian              arteries. *See table(s) above for measurements and observations.  Electronically signed by Coral Else MD on 10/09/2020 at 7:33:18 AM.    Final      TODAY-DAY OF  DISCHARGE:  Subjective:   Christian [name] today has no headache,no chest abdominal pain,no new weakness tingling or numbness, feels much better wants to go home today.   Objective:   Blood pressure 121/65, pulse 82, temperature 98.3 F (36.8 C), temperature source Oral, resp. rate 18, height 6' (1.829 m), weight 127.5 kg, SpO2 95 %.  Intake/Output Summary (Last 24 hours) at 10/10/2020 0902 Last data filed at 10/10/2020 0041 Gross per 24 hour  Intake 360 ml  Output 2800 ml  Net -2440 ml   Filed Weights   10/07/20 1457  Weight: 127.5 kg    Exam: Awake Alert, Oriented *3, No new F.N deficits, Normal affect [place].AT,PERRAL Supple Neck,No JVD, No cervical lymphadenopathy appriciated.  Symmetrical Chest wall movement, Good air movement bilaterally, CTAB RRR,No Gallops,Rubs or new Murmurs, No Parasternal Heave +ve B.Sounds, Abd Soft, Non tender, No organomegaly appriciated, No rebound -guarding or rigidity. No Cyanosis, Clubbing or edema, No new Rash or bruise   PERTINENT RADIOLOGIC STUDIES: CT Head Wo Contrast  Result Date: 10/09/2020 CLINICAL DATA:  Stroke, follow-up. EXAM: CT HEAD WITHOUT CONTRAST TECHNIQUE: Contiguous axial images were obtained from the base of the skull through the vertex without intravenous contrast. COMPARISON:  MRI/MRA head 10/07/2020.  Head CT 10/07/2020. FINDINGS: Brain: Mild generalized cerebral atrophy. Minimal ill-defined hypoattenuation within the cerebral white matter is nonspecific, but compatible with chronic small vessel ischemic disease. There is no acute intracranial hemorrhage. No demarcated cortical infarct is identified. No extra-axial fluid collection. No evidence of intracranial mass. No midline shift. Vascular: No hyperdense vessel.  Atherosclerotic calcifications. Skull: Normal. Negative for fracture or focal lesion. Sinuses/Orbits: Visualized orbits show no acute finding. Mild ethmoid sinus mucosal thickening.  Other: Small right mastoid effusion.  IMPRESSION: No evidence of acute intracranial abnormality. Mild cerebral atrophy and chronic small vessel ischemic disease. Mild ethmoid sinus mucosal thickening. Small right mastoid effusion. Electronically Signed   By: Jackey Loge DO   On: 10/09/2020 07:31   EEG adult  Result Date: 10/09/2020 Charlsie Quest, MD     10/09/2020 11:06 AM Patient Name: Christian [name] MRN: [id number] Epilepsy Attending: Charlsie Quest Referring Physician/Provider: Dr Delia Heady Date: 10/08/2020 Duration: 25.15 mins Patient history: 69 year old male with encephalopathy as well as speech disturbance. EEG to evaluate for seizures. Level of alertness: Awake, drowsy AEDs during EEG study: Depakote Technical aspects: This EEG study was done with scalp electrodes positioned according to the 10-20 International system of electrode placement. Electrical activity was acquired at a sampling rate of 500Hz  and reviewed with a high frequency filter of 70Hz  and a low frequency filter of 1Hz . EEG data were recorded continuously and digitally stored. Description: The posterior dominant rhythm consists of 8-9 Hz activity of moderate voltage (25-35 uV) seen predominantly in posterior head regions, symmetric and reactive to eye opening and eye closing. Drowsiness was characterized by attenuation of the posterior background rhythm.  Hyperventilation and photic stimulation were not performed.   IMPRESSION: This study is within normal limits. No seizures or epileptiform discharges were seen throughout the recording. Charlsie Quest   ECHOCARDIOGRAM COMPLETE  Result Date: 10/08/2020    ECHOCARDIOGRAM REPORT   Patient Name:   Christian [name] Date of Exam: 10/08/2020 Medical Rec #:  [id number]        Height:       72.0 in Accession #:    [id number]       Weight:       281.0 lb Date of Birth:  1951/04/18        BSA:          2.462 m Patient Age:    69 years         BP:           123/72 mmHg Patient Gender: M                HR:           86  bpm. Exam Location:  Inpatient Procedure: 2D Echo and Intracardiac Opacification Agent Indications:    TIA 435.9 / G45.9  History:        Patient has prior history of Echocardiogram examinations, most                 recent 03/18/2020. Risk Factors:Hypertension, Diabetes and Sleep                 Apnea. Chronic kidney disease.  Sonographer:    Leta Jungling RDCS Referring Phys: [id number] [facility]  Sonographer Comments: Technically difficult study due to poor echo windows. IMPRESSIONS  1. Left ventricular ejection fraction, by estimation, is 65 to 70%. The left ventricle has normal function. The left ventricle has no regional wall motion abnormalities. There is mild left ventricular hypertrophy. Left ventricular diastolic parameters are consistent with Grade I diastolic dysfunction (impaired relaxation).  2. Right ventricular systolic function is normal. The right ventricular size is normal.  3. The mitral valve is normal in structure. No evidence of mitral valve regurgitation. No evidence of mitral stenosis. Severe mitral annular calcification.  4. The aortic valve is tricuspid. There is moderate calcification of the aortic valve. There is moderate thickening of the aortic valve.  Aortic valve regurgitation is not visualized. Mild to moderate aortic valve sclerosis/calcification is present, without any evidence of aortic stenosis.  5. The inferior vena cava is normal in size with greater than 50% respiratory variability, suggesting right atrial pressure of 3 mmHg. Conclusion(s)/Recommendation(s): No intracardiac source of embolism detected on this transthoracic study. A transesophageal echocardiogram is recommended to exclude cardiac source of embolism if clinically indicated. FINDINGS  Left Ventricle: Left ventricular ejection fraction, by estimation, is 65 to 70%. The left ventricle has normal function. The left ventricle has no regional wall motion abnormalities. Definity contrast agent was given IV to delineate  the left ventricular  endocardial borders. The left ventricular internal cavity size was normal in size. There is mild left ventricular hypertrophy. Left ventricular diastolic parameters are consistent with Grade I diastolic dysfunction (impaired relaxation). Right Ventricle: The right ventricular size is normal. No increase in right ventricular wall thickness. Right ventricular systolic function is normal. Left Atrium: Left atrial size was normal in size. Right Atrium: Right atrial size was normal in size. Pericardium: There is no evidence of pericardial effusion. Mitral Valve: The mitral valve is normal in structure. There is moderate thickening of the mitral valve leaflet(s). There is moderate calcification of the mitral valve leaflet(s). Severe mitral annular calcification. No evidence of mitral valve regurgitation. No evidence of mitral valve stenosis. Tricuspid Valve: The tricuspid valve is normal in structure. Tricuspid valve regurgitation is not demonstrated. No evidence of tricuspid stenosis. Aortic Valve: The aortic valve is tricuspid. There is moderate calcification of the aortic valve. There is moderate thickening of the aortic valve. Aortic valve regurgitation is not visualized. Mild to moderate aortic valve sclerosis/calcification is present, without any evidence of aortic stenosis. Pulmonic Valve: The pulmonic valve was normal in structure. Pulmonic valve regurgitation is not visualized. No evidence of pulmonic stenosis. Aorta: The aortic root is normal in size and structure. Venous: The inferior vena cava is normal in size with greater than 50% respiratory variability, suggesting right atrial pressure of 3 mmHg. IAS/Shunts: No atrial level shunt detected by color flow Doppler.  LEFT VENTRICLE PLAX 2D LVIDd:         4.20 cm      Diastology LVIDs:         3.05 cm      LV e' medial:    5.00 cm/s LV PW:         1.00 cm      LV E/e' medial:  12.9 LV IVS:        1.20 cm      LV e' lateral:   6.09 cm/s LVOT  diam:     2.05 cm      LV E/e' lateral: 10.6 LV SV:         50 LV SV Index:   20 LVOT Area:     3.30 cm  LV Volumes (MOD) LV vol d, MOD A2C: 78.6 ml LV vol d, MOD A4C: 172.0 ml LV vol s, MOD A2C: 31.1 ml LV vol s, MOD A4C: 46.9 ml LV SV MOD A2C:     47.5 ml LV SV MOD A4C:     172.0 ml LV SV MOD BP:      82.0 ml RIGHT VENTRICLE TAPSE (M-mode): 2.3 cm LEFT ATRIUM             Index       RIGHT ATRIUM           Index LA diam:  3.50 cm 1.42 cm/m  RA Area:     14.60 cm LA Vol (A2C):   43.0 ml 17.47 ml/m RA Volume:   30.30 ml  12.31 ml/m LA Vol (A4C):   42.3 ml 17.18 ml/m LA Biplane Vol: 44.1 ml 17.91 ml/m  AORTIC VALVE LVOT Vmax:   91.60 cm/s LVOT Vmean:  63.800 cm/s LVOT VTI:    0.150 m  AORTA Ao Root diam: 3.30 cm Ao Asc diam:  3.40 cm MITRAL VALVE MV Area (PHT): 4.54 cm    SHUNTS MV Decel Time: 167 msec    Systemic VTI:  0.15 m MV E velocity: 64.50 cm/s  Systemic Diam: 2.05 cm MV A velocity: 90.30 cm/s MV E/A ratio:  0.71 Donato Schultz MD Electronically signed by Donato Schultz MD Signature Date/Time: 10/08/2020/11:31:40 AM    Final    VAS US CAROTID  Result Date: 10/09/2020 Carotid Arterial Duplex Study Comparison Study:  09/02/2015- carotid artery duplex Performing Technologist: Gertie Fey MHA, RDMS, RVT, RDCS  Examination Guidelines: A complete evaluation includes B-mode imaging, spectral Doppler, color Doppler, and power Doppler as needed of all accessible portions of each vessel. Bilateral testing is considered an integral part of a complete examination. Limited examinations for reoccurring indications may be performed as noted.  Right Carotid Findings: +----------+--------+--------+--------+--------------------------+--------+           PSV cm/sEDV cm/sStenosisPlaque Description        Comments +----------+--------+--------+--------+--------------------------+--------+ CCA Prox  93      21                                                  +----------+--------+--------+--------+--------------------------+--------+ CCA Distal107     24              smooth and heterogenous            +----------+--------+--------+--------+--------------------------+--------+ ICA Prox  117     19              heterogenous and irregular         +----------+--------+--------+--------+--------------------------+--------+ ICA Distal75      31                                                 +----------+--------+--------+--------+--------------------------+--------+ ECA       263     24              heterogenous and irregular         +----------+--------+--------+--------+--------------------------+--------+ +----------+--------+-------+----------------+-------------------+           PSV cm/sEDV cmsDescribe        Arm Pressure (mmHG) +----------+--------+-------+----------------+-------------------+ [id number]            Multiphasic, WNL                    +----------+--------+-------+----------------+-------------------+ +---------+--------+--+--------+---------+ VertebralPSV cm/s34EDV cm/sAntegrade +---------+--------+--+--------+---------+  Left Carotid Findings: +----------+--------+--------+--------+-----------------------+--------+           PSV cm/sEDV cm/sStenosisPlaque Description     Comments +----------+--------+--------+--------+-----------------------+--------+ CCA Prox  159     26              smooth and heterogenous         +----------+--------+--------+--------+-----------------------+--------+ CCA Distal171     21  smooth and heterogenous         +----------+--------+--------+--------+-----------------------+--------+ ICA Prox  62      11              smooth and heterogenous         +----------+--------+--------+--------+-----------------------+--------+ ICA Distal72      23                                               +----------+--------+--------+--------+-----------------------+--------+ ECA       185     17                                              +----------+--------+--------+--------+-----------------------+--------+ +----------+--------+--------+----------------+-------------------+           PSV cm/sEDV cm/sDescribe        Arm Pressure (mmHG) +----------+--------+--------+----------------+-------------------+ [id number]              Multiphasic, WNL                    +----------+--------+--------+----------------+-------------------+ +---------+--------+--+--------+--+---------+ VertebralPSV cm/s39EDV cm/s11Antegrade +---------+--------+--+--------+--+---------+   Summary: Right Carotid: Velocities in the right ICA are consistent with a 1-39% stenosis.                The ECA appears >50% stenosed. Left Carotid: Velocities in the left ICA are consistent with a 1-39% stenosis. Vertebrals:  Bilateral vertebral arteries demonstrate antegrade flow. Right              vertebral artery demonstrates high resistant flow. Subclavians: Normal flow hemodynamics were seen in bilateral subclavian              arteries. *See table(s) above for measurements and observations.  Electronically signed by Coral Else MD on 10/09/2020 at 7:33:18 AM.    Final      PERTINENT LAB RESULTS: CBC: Recent Labs    10/07/20 1534  WBC 5.9  HGB 12.3*  HCT 42.6  PLT 217   CMET CMP     Component Value Date/Time   NA 137 10/09/2020 0105   NA 139 08/10/2014 1327   K 4.7 10/09/2020 0105   K 4.3 08/10/2014 1327   CL 105 10/09/2020 0105   CL 105 08/10/2014 1327   CO2 22 10/09/2020 0105   CO2 27 08/10/2014 1327   GLUCOSE 113 (H) 10/09/2020 0105   GLUCOSE 109 (H) 08/10/2014 1327   BUN 48 (H) 10/09/2020 0105   BUN 13 08/10/2014 1327   CREATININE 1.98 (H) 10/09/2020 0105   CREATININE 1.25 08/10/2014 1327   CALCIUM 9.2 10/09/2020 0105   CALCIUM 8.8 08/10/2014 1327   PROT 7.5 10/07/2020 1534   PROT  7.0 08/10/2014 1327   ALBUMIN 3.5 10/07/2020 1534   ALBUMIN 3.3 (L) 08/10/2014 1327   AST 14 (L) 10/07/2020 1534   AST 96 (H) 08/10/2014 1327   ALT 13 10/07/2020 1534   ALT 187 (H) 08/10/2014 1327   ALKPHOS 52 10/07/2020 1534   ALKPHOS 81 08/10/2014 1327   BILITOT 0.7 10/07/2020 1534   BILITOT 0.3 08/10/2014 1327   GFRNONAA 36 (L) 10/09/2020 0105   GFRNONAA >60 08/10/2014 1327   GFRAA 52 (L) 04/01/2020 0155   GFRAA >60 08/10/2014 1327    GFR Estimated Creatinine Clearance: 50  mL/min (A) (by C-G formula based on SCr of 1.98 mg/dL (H)). No results for input(s): LIPASE, AMYLASE in the last 72 hours. Recent Labs    10/07/20 1728  CKTOTAL 98   Invalid input(s): POCBNP No results for input(s): DDIMER in the last 72 hours. Recent Labs    10/08/20 0353  HGBA1C 11.1*   Recent Labs    10/08/20 0353  CHOL 128  HDL 33*  LDLCALC 81  TRIG 72  CHOLHDL 3.9   Recent Labs    10/07/20 1534  TSH 1.021   No results for input(s): VITAMINB12, FOLATE, FERRITIN, TIBC, IRON, RETICCTPCT in the last 72 hours. Coags: Recent Labs    10/07/20 1534  INR 1.9*   Microbiology: Recent Results (from the past 240 hour(s))  Respiratory Panel by RT PCR (Flu A&B, Covid) - Nasopharyngeal Swab     Status: None   Collection Time: 10/07/20  3:38 PM   Specimen: Nasopharyngeal Swab  Result Value Ref Range Status   SARS Coronavirus 2 by RT PCR NEGATIVE NEGATIVE Final    Comment: (NOTE) SARS-CoV-2 target nucleic acids are NOT DETECTED.  The SARS-CoV-2 RNA is generally detectable in upper respiratoy specimens during the acute phase of infection. The lowest concentration of SARS-CoV-2 viral copies this assay can detect is 131 copies/mL. A negative result does not preclude SARS-Cov-2 infection and should not be used as the sole basis for treatment or other patient management decisions. A negative result may occur with  improper specimen collection/handling, submission of specimen other than  nasopharyngeal swab, presence of viral mutation(s) within the areas targeted by this assay, and inadequate number of viral copies (<131 copies/mL). A negative result must be combined with clinical observations, patient history, and epidemiological information. The expected result is Negative.  Fact Sheet for Patients:  https://www.moore.com/  Fact Sheet for Healthcare Providers:  https://www.young.biz/  This test is no t yet approved or cleared by the [place] FDA and  has been authorized for detection and/or diagnosis of SARS-CoV-2 by FDA under an Emergency Use Authorization (EUA). This EUA will remain  in effect (meaning this test can be used) for the duration of the COVID-19 declaration under Section 564(b)(1) of the Act, 21 U.S.C. section 360bbb-3(b)(1), unless the authorization is terminated or revoked sooner.     Influenza A by PCR NEGATIVE NEGATIVE Final   Influenza B by PCR NEGATIVE NEGATIVE Final    Comment: (NOTE) The Xpert Xpress SARS-CoV-2/FLU/RSV assay is intended as an aid in  the diagnosis of influenza from Nasopharyngeal swab specimens and  should not be used as a sole basis for treatment. Nasal washings and  aspirates are unacceptable for Xpert Xpress SARS-CoV-2/FLU/RSV  testing.  Fact Sheet for Patients: https://www.moore.com/  Fact Sheet for Healthcare Providers: https://www.young.biz/  This test is not yet approved or cleared by the [place] FDA and  has been authorized for detection and/or diagnosis of SARS-CoV-2 by  FDA under an Emergency Use Authorization (EUA). This EUA will remain  in effect (meaning this test can be used) for the duration of the  Covid-19 declaration under Section 564(b)(1) of the Act, 21  U.S.C. section 360bbb-3(b)(1), unless the authorization is  terminated or revoked. Performed at [facility], [address], [place],  [place] [address]     FURTHER DISCHARGE INSTRUCTIONS:  Get Medicines reviewed and adjusted: Please take all your medications with you for your next visit with your Primary MD  Laboratory/radiological data: Please request your Primary MD to go over  all hospital tests and procedure/radiological results at the follow up, please ask your Primary MD to get all Hospital records sent to his/her office.  In some cases, they will be blood work, cultures and biopsy results pending at the time of your discharge. Please request that your primary care M.D. goes through all the records of your hospital data and follows up on these results.  Also Note the following: If you experience worsening of your admission symptoms, develop shortness of breath, life threatening emergency, suicidal or homicidal thoughts you must seek medical attention immediately by calling 911 or calling your MD immediately  if symptoms less severe.  You must read complete instructions/literature along with all the possible adverse reactions/side effects for all the Medicines you take and that have been prescribed to you. Take any new Medicines after you have completely understood and accpet all the possible adverse reactions/side effects.   Do not drive when taking Pain medications or sleeping medications (Benzodaizepines)  Do not take more than prescribed Pain, Sleep and Anxiety Medications. It is not advisable to combine anxiety,sleep and pain medications without talking with your primary care practitioner  Special Instructions: If you have smoked or chewed Tobacco  in the last 2 yrs please stop smoking, stop any regular Alcohol  and or any Recreational drug use.  Wear Seat belts while driving.  Please note: You were cared for by a hospitalist during your hospital stay. Once you are discharged, your primary care physician will handle any further medical issues. Please note that NO REFILLS for any discharge medications will be authorized  once you are discharged, as it is imperative that you return to your primary care physician (or establish a relationship with a primary care physician if you do not have one) for your post hospital discharge needs so that they can reassess your need for medications and monitor your lab values.  Total Time spent coordinating discharge including counseling, education and face to face time equals 35 minutes.  SignedJeoffrey Massed 10/10/2020 9:02 AM

## 2020-10-11 LAB — GLUCOSE, CAPILLARY
Glucose-Capillary: 121 mg/dL — ABNORMAL HIGH (ref 70–99)
Glucose-Capillary: 143 mg/dL — ABNORMAL HIGH (ref 70–99)
Glucose-Capillary: 140 mg/dL — ABNORMAL HIGH (ref 70–99)
Glucose-Capillary: 166 mg/dL — ABNORMAL HIGH (ref 70–99)

## 2020-10-11 NOTE — Progress Notes (Addendum)
PROGRESS NOTE        PATIENT DETAILS Name: Christian [name] Age: 69 y.o. Sex: male Date of Birth: 1951/03/07 Admit Date: 10/07/2020 Admitting Physician Emeline General, MD PCP:Sun, Charise Carwin, MD  Brief Narrative: Patient is a 69 y.o. male CAD s/p PCI, PAF on Xarelto, DM-2, HTN, HLD, CKD stage IIIa, bipolar disorder, OSA on CPAP-presented to the ED after he had difficulty formulating sentences.  He was subsequently admitted for further evaluation and treatment.  Significant events: 11/9>> admit with aphasia  Significant studies: 11/9>> chest x-ray: No pneumonia. 11/9>> CT head: No acute intracranial process 11/9>> MRI brain: No acute intracranial abnormality. 11/9>> MRI brain: Chronically advanced intracranial atherosclerosis. 11/10>> Echo: EF 65-70%, grade 1 diastolic dysfunction. 11/10>> carotid Doppler: No significant stenosis 11/10>> LDL: 81 11/10>> A1c 11.1 11/11>> CT head: No evidence of acute intracranial abnormality.  Antimicrobial therapy: None  Microbiology data: None  Procedures : 11/10>> EEG:No seizures  Consults: Neurology  DVT Prophylaxis : rivaroxaban (XARELTO) tablet 20 mg    Subjective: Lying comfortably in bed-speech is fluent-no major issues-moving all 4 extremities.  Assessment/Plan: Aphasia:Speech has significantly improved overnight-MRI brain on 11/9 - for CVA-CT head repeated on 11/11 morning-which does not show any acute abnormalities. Suspect in retrospect-that this probably was more from encephalopathy from AKI. EEG was negative for seizures-no further recommendations from neurology.  Acute metabolic encephalopathy:Likely secondary to AKI-no indication of infection-UA/chest x-ray negative. Afebrile. Significantly better-suspect mental status is now close to baseline.  AKI on CKD stage IIIa: AKI likely hemodynamically mediated-improved with supportive care-creatinine has plateaued around 1.92-1.98 range. Continue  to avoid nephrotoxic agents-repeat electrolytes periodically.  Follow-up with primary care practitioner-consider outpatient referral to nephrology.  HTN: BP stable-continue metoprolol.  Hypothyroidism: Continue Cytomel.  TSH within normal limits.  CAD s/p PCI to LAD on 04/29/2020: Reviewed outpatient cardiology note-continue Plavix.  PAF: Currently in sinus rhythm-continue Xarelto and metoprolol.  Note-appears to be on both Plavix and Xarelto-being followed by cardiology in the outpatient setting-defer further adjustments to his primary cardiologist at [facility].  Bipolar disorder: Continue Seroquel,Wellbutrin and Depakote.  OSA: CPAP nightly.  Obesity: Estimated body mass index is 38.11 kg/m as calculated from the following:   Height as of this encounter: 6' (1.829 m).   Weight as of this encounter: 127.5 kg.    Diet: Diet Order            Diet - low sodium heart healthy           Diet Carb Modified           Diet heart healthy/carb modified Room service appropriate? Yes; Fluid consistency: Thin  Diet effective now                  Code Status: Full code   Family Communication: Spoke with Daughter [name] [phone]) over phone 11/13  Disposition Plan: Status is: Inpatient  Remains inpatient appropriate because:Inpatient level of care appropriate due to severity of illness   Dispo: The patient is from: Home              Anticipated d/c is to: SNF              Anticipated d/c date is: 1 day              Patient currently is  medically stable to d/c.  Barriers to Discharge: Awaiting SNF bed.  Antimicrobial agents: Anti-infectives (From admission, onward)   None      Time spent: 15 minutes-Greater than 50% of this time was spent in counseling, explanation of diagnosis, planning of further management, and coordination of care.  MEDICATIONS: Scheduled Meds: . ARIPiprazole  20 mg Oral Daily  . atorvastatin  80 mg Oral Daily  . buPROPion  75 mg Oral Daily    . clopidogrel  75 mg Oral Daily  . divalproex  500 mg Oral Q12H  . insulin aspart  0-15 Units Subcutaneous TID WC  . insulin aspart  0-5 Units Subcutaneous QHS  . liothyronine  25 mcg Oral QAC breakfast  . metoprolol succinate  50 mg Oral Daily  . mirtazapine  15 mg Oral QHS  . pantoprazole  40 mg Oral Q1200  . QUEtiapine  25 mg Oral BID  . QUEtiapine  300 mg Oral QHS  . rivaroxaban  20 mg Oral Q supper   Continuous Infusions:  PRN Meds:.acetaminophen **OR** acetaminophen (TYLENOL) oral liquid 160 mg/5 mL **OR** acetaminophen, hydrALAZINE, senna-docusate   PHYSICAL EXAM: Vital signs: Vitals:   10/11/20 0413 10/11/20 0745 10/11/20 0928 10/11/20 1150  BP: 114/81 110/69 114/77 98/70  Pulse: 79 88  83  Resp: 18   19  Temp: 98.6 F (37 C) 98.5 F (36.9 C)  98.4 F (36.9 C)  TempSrc: Oral Oral  Oral  SpO2: 98% 97%  95%  Weight:      Height:       Filed Weights   10/07/20 1457  Weight: 127.5 kg   Body mass index is 38.11 kg/m.   Gen Exam:Alert awake-not in any distress HEENT:atraumatic, normocephalic Chest: B/L clear to auscultation anteriorly CVS:S1S2 regular Abdomen:soft non tender, non distended Extremities:no edema Neurology: Non focal Skin: no rash  I have personally reviewed following labs and imaging studies  LABORATORY DATA: CBC: Recent Labs  Lab 10/07/20 1534  WBC 5.9  NEUTROABS 4.6  HGB 12.3*  HCT 42.6  MCV 78.0*  PLT 217    Basic Metabolic Panel: Recent Labs  Lab 10/07/20 1534 10/08/20 0353 10/09/20 0105  NA 135 134* 137  K 5.2* 4.6 4.7  CL 99 102 105  CO2 26 24 22   GLUCOSE 228* 150* 113*  BUN 63* 54* 48*  CREATININE 2.31* 1.92* 1.98*  CALCIUM 10.0 9.4 9.2    GFR: Estimated Creatinine Clearance: 50 mL/min (A) (by C-G formula based on SCr of 1.98 mg/dL (H)).  Liver Function Tests: Recent Labs  Lab 10/07/20 1534  AST 14*  ALT 13  ALKPHOS 52  BILITOT 0.7  PROT 7.5  ALBUMIN 3.5   No results for input(s): LIPASE, AMYLASE  in the last 168 hours. Recent Labs  Lab 10/08/20 1621  AMMONIA 37*    Coagulation Profile: Recent Labs  Lab 10/07/20 1534  INR 1.9*    Cardiac Enzymes: Recent Labs  Lab 10/07/20 1728  CKTOTAL 98    BNP (last 3 results) No results for input(s): PROBNP in the last 8760 hours.  Lipid Profile: No results for input(s): CHOL, HDL, LDLCALC, TRIG, CHOLHDL, LDLDIRECT in the last 72 hours.  Thyroid Function Tests: No results for input(s): TSH, T4TOTAL, FREET4, T3FREE, THYROIDAB in the last 72 hours.  Anemia Panel: No results for input(s): VITAMINB12, FOLATE, FERRITIN, TIBC, IRON, RETICCTPCT in the last 72 hours.  Urine analysis:    Component Value Date/Time   COLORURINE STRAW (A) 10/07/2020 1728   APPEARANCEUR CLEAR 10/07/2020 1728  APPEARANCEUR Cloudy 08/01/2014 1614   LABSPEC 1.010 10/07/2020 1728   LABSPEC 1.008 08/01/2014 1614   PHURINE 5.0 10/07/2020 1728   GLUCOSEU >=500 (A) 10/07/2020 1728   GLUCOSEU Negative 08/01/2014 1614   HGBUR NEGATIVE 10/07/2020 1728   BILIRUBINUR NEGATIVE 10/07/2020 1728   BILIRUBINUR Negative 08/01/2014 1614   KETONESUR NEGATIVE 10/07/2020 1728   PROTEINUR NEGATIVE 10/07/2020 1728   UROBILINOGEN 0.2 07/01/2014 1809   NITRITE NEGATIVE 10/07/2020 1728   LEUKOCYTESUR NEGATIVE 10/07/2020 1728   LEUKOCYTESUR Negative 08/01/2014 1614    Sepsis Labs: Lactic Acid, Venous    Component Value Date/Time   LATICACIDVEN 0.7 06/17/2014 2338    MICROBIOLOGY: Recent Results (from the past 240 hour(s))  Respiratory Panel by RT PCR (Flu A&B, Covid) - Nasopharyngeal Swab     Status: None   Collection Time: 10/07/20  3:38 PM   Specimen: Nasopharyngeal Swab  Result Value Ref Range Status   SARS Coronavirus 2 by RT PCR NEGATIVE NEGATIVE Final    Comment: (NOTE) SARS-CoV-2 target nucleic acids are NOT DETECTED.  The SARS-CoV-2 RNA is generally detectable in upper respiratoy specimens during the acute phase of infection. The  lowest concentration of SARS-CoV-2 viral copies this assay can detect is 131 copies/mL. A negative result does not preclude SARS-Cov-2 infection and should not be used as the sole basis for treatment or other patient management decisions. A negative result may occur with  improper specimen collection/handling, submission of specimen other than nasopharyngeal swab, presence of viral mutation(s) within the areas targeted by this assay, and inadequate number of viral copies (<131 copies/mL). A negative result must be combined with clinical observations, patient history, and epidemiological information. The expected result is Negative.  Fact Sheet for Patients:  https://www.moore.com/  Fact Sheet for Healthcare Providers:  https://www.young.biz/  This test is no t yet approved or cleared by the [place] FDA and  has been authorized for detection and/or diagnosis of SARS-CoV-2 by FDA under an Emergency Use Authorization (EUA). This EUA will remain  in effect (meaning this test can be used) for the duration of the COVID-19 declaration under Section 564(b)(1) of the Act, 21 U.S.C. section 360bbb-3(b)(1), unless the authorization is terminated or revoked sooner.     Influenza A by PCR NEGATIVE NEGATIVE Final   Influenza B by PCR NEGATIVE NEGATIVE Final    Comment: (NOTE) The Xpert Xpress SARS-CoV-2/FLU/RSV assay is intended as an aid in  the diagnosis of influenza from Nasopharyngeal swab specimens and  should not be used as a sole basis for treatment. Nasal washings and  aspirates are unacceptable for Xpert Xpress SARS-CoV-2/FLU/RSV  testing.  Fact Sheet for Patients: https://www.moore.com/  Fact Sheet for Healthcare Providers: https://www.young.biz/  This test is not yet approved or cleared by the [place] FDA and  has been authorized for detection and/or diagnosis of SARS-CoV-2 by  FDA under  an Emergency Use Authorization (EUA). This EUA will remain  in effect (meaning this test can be used) for the duration of the  Covid-19 declaration under Section 564(b)(1) of the Act, 21  U.S.C. section 360bbb-3(b)(1), unless the authorization is  terminated or revoked. Performed at [facility], [address] [place], [place] [address]   Respiratory Panel by RT PCR (Flu A&B, Covid) - Nasopharyngeal Swab     Status: None   Collection Time: 10/10/20 12:36 PM   Specimen: Nasopharyngeal Swab  Result Value Ref Range Status   SARS Coronavirus 2 by RT PCR NEGATIVE NEGATIVE Final  Comment: (NOTE) SARS-CoV-2 target nucleic acids are NOT DETECTED.  The SARS-CoV-2 RNA is generally detectable in upper respiratoy specimens during the acute phase of infection. The lowest concentration of SARS-CoV-2 viral copies this assay can detect is 131 copies/mL. A negative result does not preclude SARS-Cov-2 infection and should not be used as the sole basis for treatment or other patient management decisions. A negative result may occur with  improper specimen collection/handling, submission of specimen other than nasopharyngeal swab, presence of viral mutation(s) within the areas targeted by this assay, and inadequate number of viral copies (<131 copies/mL). A negative result must be combined with clinical observations, patient history, and epidemiological information. The expected result is Negative.  Fact Sheet for Patients:  https://www.moore.com/  Fact Sheet for Healthcare Providers:  https://www.young.biz/  This test is no t yet approved or cleared by the [place] FDA and  has been authorized for detection and/or diagnosis of SARS-CoV-2 by FDA under an Emergency Use Authorization (EUA). This EUA will remain  in effect (meaning this test can be used) for the duration of the COVID-19 declaration under Section 564(b)(1) of the Act, 21  U.S.C. section 360bbb-3(b)(1), unless the authorization is terminated or revoked sooner.     Influenza A by PCR NEGATIVE NEGATIVE Final   Influenza B by PCR NEGATIVE NEGATIVE Final    Comment: (NOTE) The Xpert Xpress SARS-CoV-2/FLU/RSV assay is intended as an aid in  the diagnosis of influenza from Nasopharyngeal swab specimens and  should not be used as a sole basis for treatment. Nasal washings and  aspirates are unacceptable for Xpert Xpress SARS-CoV-2/FLU/RSV  testing.  Fact Sheet for Patients: https://www.moore.com/  Fact Sheet for Healthcare Providers: https://www.young.biz/  This test is not yet approved or cleared by the [place] FDA and  has been authorized for detection and/or diagnosis of SARS-CoV-2 by  FDA under an Emergency Use Authorization (EUA). This EUA will remain  in effect (meaning this test can be used) for the duration of the  Covid-19 declaration under Section 564(b)(1) of the Act, 21  U.S.C. section 360bbb-3(b)(1), unless the authorization is  terminated or revoked. Performed at [facility], [address], [place], [place] [address]     RADIOLOGY STUDIES/RESULTS: No results found.   LOS: 4 days   Jeoffrey Massed, MD  [facility]    To contact the attending provider between 7A-7P or the covering provider during after hours 7P-7A, please log into the web site [email or web address] and access using universal  password for that web site. If you do not have the password, please call the hospital operator.  10/11/2020, 1:57 PM

## 2020-10-12 LAB — GLUCOSE, CAPILLARY
Glucose-Capillary: 108 mg/dL — ABNORMAL HIGH (ref 70–99)
Glucose-Capillary: 156 mg/dL — ABNORMAL HIGH (ref 70–99)
Glucose-Capillary: 142 mg/dL — ABNORMAL HIGH (ref 70–99)
Glucose-Capillary: 205 mg/dL — ABNORMAL HIGH (ref 70–99)

## 2020-10-12 MED ORDER — METOPROLOL SUCCINATE ER 25 MG PO TB24
25.0000 mg | ORAL_TABLET | Freq: Every day | ORAL | Status: DC
  Administered 2020-10-12: 25 mg via ORAL
  Filled 2020-10-12: qty 1

## 2020-10-12 NOTE — Progress Notes (Signed)
PROGRESS NOTE        PATIENT DETAILS Name: Christian [name] Age: 69 y.o. Sex: male Date of Birth: Jan 10, 1951 Admit Date: 10/07/2020 Admitting Physician Emeline General, MD PCP:Sun, Charise Carwin, MD  Brief Narrative: Patient is a 69 y.o. male CAD s/p PCI, PAF on Xarelto, DM-2, HTN, HLD, CKD stage IIIa, bipolar disorder, OSA on CPAP-presented to the ED after he had difficulty formulating sentences.  He was subsequently admitted for further evaluation and treatment.  Significant events: 11/9>> admit with aphasia  Significant studies: 11/9>> chest x-ray: No pneumonia. 11/9>> CT head: No acute intracranial process 11/9>> MRI brain: No acute intracranial abnormality. 11/9>> MRI brain: Chronically advanced intracranial atherosclerosis. 11/10>> Echo: EF 65-70%, grade 1 diastolic dysfunction. 11/10>> carotid Doppler: No significant stenosis 11/10>> LDL: 81 11/10>> A1c 11.1 11/11>> CT head: No evidence of acute intracranial abnormality.  Antimicrobial therapy: None  Microbiology data: None  Procedures : 11/10>> EEG:No seizures  Consults: Neurology  DVT Prophylaxis : rivaroxaban (XARELTO) tablet 20 mg    Subjective: Lying comfortably in bed-speech is clear-just sleepy-following commands-moving all 4 extremities.  Assessment/Plan: Aphasia:Speech has significantly improved overnight-MRI brain on 11/9 - for CVA-CT head repeated on 11/11 morning-which does not show any acute abnormalities. Suspect in retrospect-that this probably was more from encephalopathy from AKI. EEG was negative for seizures-no further recommendations from neurology.  Acute metabolic encephalopathy:Likely secondary to AKI-no indication of infection-UA/chest x-ray negative. Afebrile. Significantly better-suspect mental status is now close to baseline.  AKI on CKD stage IIIa: AKI likely hemodynamically mediated-improved with supportive care-creatinine has plateaued around 1.92-1.98  range. Continue to avoid nephrotoxic agents-repeat electrolytes periodically.  Follow-up with primary care practitioner-consider outpatient referral to nephrology.  HTN: BP stable-continue metoprolol.  Hypothyroidism: Continue Cytomel.  TSH within normal limits.  CAD s/p PCI to LAD on 04/29/2020: Reviewed outpatient cardiology note-continue Plavix.  PAF: Currently in sinus rhythm-continue Xarelto and metoprolol.  Note-appears to be on both Plavix and Xarelto-being followed by cardiology in the outpatient setting-defer further adjustments to his primary cardiologist at [facility].  Bipolar disorder: Continue Seroquel,Wellbutrin and Depakote.  OSA: CPAP nightly.  Obesity: Estimated body mass index is 38.11 kg/m as calculated from the following:   Height as of this encounter: 6' (1.829 m).   Weight as of this encounter: 127.5 kg.    Diet: Diet Order            Diet - low sodium heart healthy           Diet Carb Modified           Diet heart healthy/carb modified Room service appropriate? Yes; Fluid consistency: Thin  Diet effective now                  Code Status: Full code   Family Communication: Spoke with Daughter [name] [phone]) over phone 11/13  Disposition Plan: Status is: Inpatient  Remains inpatient appropriate because:Inpatient level of care appropriate due to severity of illness   Dispo: The patient is from: Home              Anticipated d/c is to: SNF              Anticipated d/c date is: 1 day              Patient currently is  medically stable to d/c.  Barriers to Discharge: Awaiting SNF bed.  Antimicrobial agents: Anti-infectives (From admission, onward)   None      Time spent: 15 minutes-Greater than 50% of this time was spent in counseling, explanation of diagnosis, planning of further management, and coordination of care.  MEDICATIONS: Scheduled Meds: . ARIPiprazole  20 mg Oral Daily  . atorvastatin  80 mg Oral Daily  . buPROPion   75 mg Oral Daily  . clopidogrel  75 mg Oral Daily  . divalproex  500 mg Oral Q12H  . insulin aspart  0-15 Units Subcutaneous TID WC  . insulin aspart  0-5 Units Subcutaneous QHS  . liothyronine  25 mcg Oral QAC breakfast  . metoprolol succinate  25 mg Oral Daily  . mirtazapine  15 mg Oral QHS  . pantoprazole  40 mg Oral Q1200  . QUEtiapine  25 mg Oral BID  . QUEtiapine  300 mg Oral QHS  . rivaroxaban  20 mg Oral Q supper   Continuous Infusions:  PRN Meds:.acetaminophen **OR** acetaminophen (TYLENOL) oral liquid 160 mg/5 mL **OR** acetaminophen, hydrALAZINE, senna-docusate   PHYSICAL EXAM: Vital signs: Vitals:   10/11/20 2032 10/12/20 0002 10/12/20 0323 10/12/20 0913  BP: (!) 101/56 (!) 95/57 103/65 103/67  Pulse: 73 77 76 88  Resp: 18 18 16 18   Temp: 98.5 F (36.9 C) 98.7 F (37.1 C) 98.4 F (36.9 C) 98 F (36.7 C)  TempSrc: Oral Oral Oral Oral  SpO2: 95% 92% 94% 95%  Weight:      Height:       Filed Weights   10/07/20 1457  Weight: 127.5 kg   Body mass index is 38.11 kg/m.   Gen Exam:Alert awake-not in any distress HEENT:atraumatic, normocephalic Chest: B/L clear to auscultation anteriorly CVS:S1S2 regular Abdomen:soft non tender, non distended Extremities:no edema Neurology: Non focal Skin: no rash  I have personally reviewed following labs and imaging studies  LABORATORY DATA: CBC: Recent Labs  Lab 10/07/20 1534  WBC 5.9  NEUTROABS 4.6  HGB 12.3*  HCT 42.6  MCV 78.0*  PLT 217    Basic Metabolic Panel: Recent Labs  Lab 10/07/20 1534 10/08/20 0353 10/09/20 0105  NA 135 134* 137  K 5.2* 4.6 4.7  CL 99 102 105  CO2 26 24 22   GLUCOSE 228* 150* 113*  BUN 63* 54* 48*  CREATININE 2.31* 1.92* 1.98*  CALCIUM 10.0 9.4 9.2    GFR: Estimated Creatinine Clearance: 50 mL/min (A) (by C-G formula based on SCr of 1.98 mg/dL (H)).  Liver Function Tests: Recent Labs  Lab 10/07/20 1534  AST 14*  ALT 13  ALKPHOS 52  BILITOT 0.7  PROT 7.5    ALBUMIN 3.5   No results for input(s): LIPASE, AMYLASE in the last 168 hours. Recent Labs  Lab 10/08/20 1621  AMMONIA 37*    Coagulation Profile: Recent Labs  Lab 10/07/20 1534  INR 1.9*    Cardiac Enzymes: Recent Labs  Lab 10/07/20 1728  CKTOTAL 98    BNP (last 3 results) No results for input(s): PROBNP in the last 8760 hours.  Lipid Profile: No results for input(s): CHOL, HDL, LDLCALC, TRIG, CHOLHDL, LDLDIRECT in the last 72 hours.  Thyroid Function Tests: No results for input(s): TSH, T4TOTAL, FREET4, T3FREE, THYROIDAB in the last 72 hours.  Anemia Panel: No results for input(s): VITAMINB12, FOLATE, FERRITIN, TIBC, IRON, RETICCTPCT in the last 72 hours.  Urine analysis:    Component Value Date/Time   COLORURINE STRAW (A) 10/07/2020 1728   APPEARANCEUR  CLEAR 10/07/2020 1728   APPEARANCEUR Cloudy 08/01/2014 1614   LABSPEC 1.010 10/07/2020 1728   LABSPEC 1.008 08/01/2014 1614   PHURINE 5.0 10/07/2020 1728   GLUCOSEU >=500 (A) 10/07/2020 1728   GLUCOSEU Negative 08/01/2014 1614   HGBUR NEGATIVE 10/07/2020 1728   BILIRUBINUR NEGATIVE 10/07/2020 1728   BILIRUBINUR Negative 08/01/2014 1614   KETONESUR NEGATIVE 10/07/2020 1728   PROTEINUR NEGATIVE 10/07/2020 1728   UROBILINOGEN 0.2 07/01/2014 1809   NITRITE NEGATIVE 10/07/2020 1728   LEUKOCYTESUR NEGATIVE 10/07/2020 1728   LEUKOCYTESUR Negative 08/01/2014 1614    Sepsis Labs: Lactic Acid, Venous    Component Value Date/Time   LATICACIDVEN 0.7 06/17/2014 2338    MICROBIOLOGY: Recent Results (from the past 240 hour(s))  Respiratory Panel by RT PCR (Flu A&B, Covid) - Nasopharyngeal Swab     Status: None   Collection Time: 10/07/20  3:38 PM   Specimen: Nasopharyngeal Swab  Result Value Ref Range Status   SARS Coronavirus 2 by RT PCR NEGATIVE NEGATIVE Final    Comment: (NOTE) SARS-CoV-2 target nucleic acids are NOT DETECTED.  The SARS-CoV-2 RNA is generally detectable in upper respiratoy specimens  during the acute phase of infection. The lowest concentration of SARS-CoV-2 viral copies this assay can detect is 131 copies/mL. A negative result does not preclude SARS-Cov-2 infection and should not be used as the sole basis for treatment or other patient management decisions. A negative result may occur with  improper specimen collection/handling, submission of specimen other than nasopharyngeal swab, presence of viral mutation(s) within the areas targeted by this assay, and inadequate number of viral copies (<131 copies/mL). A negative result must be combined with clinical observations, patient history, and epidemiological information. The expected result is Negative.  Fact Sheet for Patients:  https://www.moore.com/  Fact Sheet for Healthcare Providers:  https://www.young.biz/  This test is no t yet approved or cleared by the [place] FDA and  has been authorized for detection and/or diagnosis of SARS-CoV-2 by FDA under an Emergency Use Authorization (EUA). This EUA will remain  in effect (meaning this test can be used) for the duration of the COVID-19 declaration under Section 564(b)(1) of the Act, 21 U.S.C. section 360bbb-3(b)(1), unless the authorization is terminated or revoked sooner.     Influenza A by PCR NEGATIVE NEGATIVE Final   Influenza B by PCR NEGATIVE NEGATIVE Final    Comment: (NOTE) The Xpert Xpress SARS-CoV-2/FLU/RSV assay is intended as an aid in  the diagnosis of influenza from Nasopharyngeal swab specimens and  should not be used as a sole basis for treatment. Nasal washings and  aspirates are unacceptable for Xpert Xpress SARS-CoV-2/FLU/RSV  testing.  Fact Sheet for Patients: https://www.moore.com/  Fact Sheet for Healthcare Providers: https://www.young.biz/  This test is not yet approved or cleared by the [place] FDA and  has been authorized for detection and/or  diagnosis of SARS-CoV-2 by  FDA under an Emergency Use Authorization (EUA). This EUA will remain  in effect (meaning this test can be used) for the duration of the  Covid-19 declaration under Section 564(b)(1) of the Act, 21  U.S.C. section 360bbb-3(b)(1), unless the authorization is  terminated or revoked. Performed at [facility], [address] [place], [place] [address]   Respiratory Panel by RT PCR (Flu A&B, Covid) - Nasopharyngeal Swab     Status: None   Collection Time: 10/10/20 12:36 PM   Specimen: Nasopharyngeal Swab  Result Value Ref Range Status   SARS Coronavirus 2 by RT PCR NEGATIVE  NEGATIVE Final    Comment: (NOTE) SARS-CoV-2 target nucleic acids are NOT DETECTED.  The SARS-CoV-2 RNA is generally detectable in upper respiratoy specimens during the acute phase of infection. The lowest concentration of SARS-CoV-2 viral copies this assay can detect is 131 copies/mL. A negative result does not preclude SARS-Cov-2 infection and should not be used as the sole basis for treatment or other patient management decisions. A negative result may occur with  improper specimen collection/handling, submission of specimen other than nasopharyngeal swab, presence of viral mutation(s) within the areas targeted by this assay, and inadequate number of viral copies (<131 copies/mL). A negative result must be combined with clinical observations, patient history, and epidemiological information. The expected result is Negative.  Fact Sheet for Patients:  https://www.moore.com/  Fact Sheet for Healthcare Providers:  https://www.young.biz/  This test is no t yet approved or cleared by the [place] FDA and  has been authorized for detection and/or diagnosis of SARS-CoV-2 by FDA under an Emergency Use Authorization (EUA). This EUA will remain  in effect (meaning this test can be used) for the duration of the COVID-19 declaration under  Section 564(b)(1) of the Act, 21 U.S.C. section 360bbb-3(b)(1), unless the authorization is terminated or revoked sooner.     Influenza A by PCR NEGATIVE NEGATIVE Final   Influenza B by PCR NEGATIVE NEGATIVE Final    Comment: (NOTE) The Xpert Xpress SARS-CoV-2/FLU/RSV assay is intended as an aid in  the diagnosis of influenza from Nasopharyngeal swab specimens and  should not be used as a sole basis for treatment. Nasal washings and  aspirates are unacceptable for Xpert Xpress SARS-CoV-2/FLU/RSV  testing.  Fact Sheet for Patients: https://www.moore.com/  Fact Sheet for Healthcare Providers: https://www.young.biz/  This test is not yet approved or cleared by the [place] FDA and  has been authorized for detection and/or diagnosis of SARS-CoV-2 by  FDA under an Emergency Use Authorization (EUA). This EUA will remain  in effect (meaning this test can be used) for the duration of the  Covid-19 declaration under Section 564(b)(1) of the Act, 21  U.S.C. section 360bbb-3(b)(1), unless the authorization is  terminated or revoked. Performed at [facility], [address] [place], [place] [address]     RADIOLOGY STUDIES/RESULTS: No results found.   LOS: 5 days   Jeoffrey Massed, MD  [facility]    To contact the attending provider between 7A-7P or the covering provider during after hours 7P-7A, please log into the web site [email or web address] and access using universal [place] password for that web site. If you do not have the password, please call the hospital operator.  10/12/2020, 10:45 AM

## 2020-10-13 DIAGNOSIS — E1122 Type 2 diabetes mellitus with diabetic chronic kidney disease: Secondary | ICD-10-CM

## 2020-10-13 DIAGNOSIS — N1831 Chronic kidney disease, stage 3a: Secondary | ICD-10-CM

## 2020-10-13 DIAGNOSIS — G9341 Metabolic encephalopathy: Secondary | ICD-10-CM

## 2020-10-13 LAB — BASIC METABOLIC PANEL
Anion gap: 8 (ref 5–15)
BUN: 49 mg/dL — ABNORMAL HIGH (ref 8–23)
CO2: 24 mmol/L (ref 22–32)
Calcium: 8.8 mg/dL — ABNORMAL LOW (ref 8.9–10.3)
Chloride: 97 mmol/L — ABNORMAL LOW (ref 98–111)
Creatinine, Ser: 2.13 mg/dL — ABNORMAL HIGH (ref 0.61–1.24)
GFR, Estimated: 33 mL/min — ABNORMAL LOW (ref >60)
Glucose, Bld: 125 mg/dL — ABNORMAL HIGH (ref 70–99)
Potassium: 4.5 mmol/L (ref 3.5–5.1)
Sodium: 129 mmol/L — ABNORMAL LOW (ref 135–145)

## 2020-10-13 LAB — GLUCOSE, CAPILLARY
Glucose-Capillary: 138 mg/dL — ABNORMAL HIGH (ref 70–99)
Glucose-Capillary: 147 mg/dL — ABNORMAL HIGH (ref 70–99)
Glucose-Capillary: 147 mg/dL — ABNORMAL HIGH (ref 70–99)
Glucose-Capillary: 136 mg/dL — ABNORMAL HIGH (ref 70–99)

## 2020-10-13 LAB — CBC
HCT: 40.6 % (ref 39.0–52.0)
Hemoglobin: 12 g/dL — ABNORMAL LOW (ref 13.0–17.0)
MCH: 22.6 pg — ABNORMAL LOW (ref 26.0–34.0)
MCHC: 29.6 g/dL — ABNORMAL LOW (ref 30.0–36.0)
MCV: 76.5 fL — ABNORMAL LOW (ref 80.0–100.0)
Platelets: 196 10*3/uL (ref 150–400)
RBC: 5.31 MIL/uL (ref 4.22–5.81)
RDW: 19.8 % — ABNORMAL HIGH (ref 11.5–15.5)
WBC: 5.7 10*3/uL (ref 4.0–10.5)
nRBC: 0 % (ref 0.0–0.2)

## 2020-10-13 LAB — RESPIRATORY PANEL BY RT PCR (FLU A&B, COVID)
Influenza A by PCR: NEGATIVE
Influenza B by PCR: NEGATIVE
SARS Coronavirus 2 by RT PCR: NEGATIVE

## 2020-10-13 MED ORDER — SODIUM CHLORIDE 0.9 % IV SOLN: INTRAVENOUS | Status: AC

## 2020-10-13 MED ORDER — METOPROLOL SUCCINATE ER 25 MG PO TB24: 12.5000 mg | ORAL_TABLET | Freq: Every day | ORAL | Status: DC

## 2020-10-13 NOTE — Discharge Summary (Addendum)
PATIENT DETAILS Name: Christian [name] Age: 69 y.o. Sex: male Date of Birth: 23-Aug-1951 MRN: [id number]. Admitting Physician: Emeline General, MD PCP:Sun, Charise Carwin, MD  Admit Date: 10/07/2020 Discharge date: 10/14/2020  Recommendations for Outpatient Follow-up:  1. Follow up with PCP in 1-2 weeks 2. Please obtain CMP/CBC in one week 3. Please ensure follow-up with patient's primary cardiologist  Admitted From:  Home  Disposition: SNF   Home Health: No  Equipment/Devices: None  Discharge Condition: Stable  CODE STATUS: FULL CODE  Diet recommendation:  Diet Order            Diet - low sodium heart healthy           Diet Carb Modified           Diet heart healthy/carb modified Room service appropriate? Yes; Fluid consistency: Thin  Diet effective now                  Brief Summary: Patient is a 69 y.o. male CAD s/p PCI, PAF on Xarelto, DM-2, HTN, HLD, CKD stage IIIa, bipolar disorder, OSA on CPAP-presented to the ED after he had difficulty formulating sentences.  He was subsequently admitted for further evaluation and treatment.  Significant events: 11/9>> admit with aphasia  Significant studies: 11/9>> chest x-ray: No pneumonia. 11/9>> CT head: No acute intracranial process 11/9>> MRI brain: No acute intracranial abnormality. 11/9>> MRI brain: Chronically advanced intracranial atherosclerosis. 11/10>> Echo: EF 65-70%, grade 1 diastolic dysfunction. 11/10>> carotid Doppler: No significant stenosis 11/10>> LDL: 81 11/10>> A1c 11.1 11/10>> EEG: No seizures 11/11>> CT head: No evidence of acute intracranial abnormality.  Antimicrobial therapy: None  Microbiology data: None  Procedures : 11/10>> EEG:  No seizures.  Consults: Neurology   Brief Hospital Course: Aphasia: Speech has significantly improved overnight-MRI brain on 11/9 - for CVA-CT head repeated on 11/11 morning-which does not show any acute abnormalities.  Suspect in  retrospect-that this probably was more from encephalopathy from AKI.  EEG was negative for seizures-no further recommendations from neurology.  Acute metabolic encephalopathy: Likely secondary to AKI-no indication of infection-UA/chest x-ray negative.  Afebrile.  Significantly better-suspect mental status is now at baseline.  AKI on CKD stage IIIa: AKI likely hemodynamically mediated-improved with supportive care-creatinine has plateaued around 2.0 range.  Continue to avoid nephrotoxic agents-repeat electrolytes periodically.  Follow-up with primary care practitioner-consider outpatient referral to nephrology.  DM-2: CBGs were controlled with SSI-resume glimepiride and Jardiance on discharge.  HTN: BP has been running in the low 100 range-patient asymptomatic-metoprolol initially half at home dose-but discontinued.  Please reassess at SNF and start antihypertensives accordingly.  Lisinopril remains on hold since admission.  Hypothyroidism: Continue Cytomel.  TSH within normal limits.  CAD s/p PCI to LAD on 04/29/2020: Reviewed outpatient cardiology note-continue Plavix.  PAF: Currently in sinus rhythm-continue Xarelto and metoprolol.  Note-appears to be on both Plavix and Xarelto-being followed by cardiology in the outpatient setting-defer further adjustments to his primary cardiologist at [facility] (spoke at length regarding this with patient's daughter).  Bipolar disorder: Continue Seroquel,Wellbutrin and Depakote.  OSA: CPAP nightly.  Obesity: Estimated body mass index is 38.11 kg/m as calculated from the following:   Height as of this encounter: 6' (1.829 m).   Weight as of this encounter: 127.5 kg.    RN pressure injury documentation: Pressure Injury 08/04/18 Stage II -  Partial thickness loss of dermis presenting as a shallow open ulcer with a red, pink wound bed without slough. (Active)  08/04/18 2253  Location: Buttocks  Location Orientation: Left;Right  Staging: Stage II -   Partial thickness loss of dermis presenting as a shallow open ulcer with a red, pink wound bed without slough.  Wound Description (Comments):   Present on Admission: Yes     Discharge Diagnoses:  Aphasia Acute metabolic encephalopathy AKI on CKD stage IIIa HTN CAD PAF   Discharge Instructions:  Activity:  As tolerated with Full fall precautions use walker/cane & assistance as needed  Discharge Instructions    Call MD for:  difficulty breathing, headache or visual disturbances   Complete by: As directed    Call MD for:  extreme fatigue   Complete by: As directed    Call MD for:  persistant dizziness or light-headedness   Complete by: As directed    Diet - low sodium heart healthy   Complete by: As directed    Diet Carb Modified   Complete by: As directed    Discharge instructions   Complete by: As directed    Follow with Primary MD  [name], MD in 1-2 weeks  Please follow-up with your primary cardiologist in 1-2 weeks.  Please get a complete blood count and chemistry panel checked by your Primary MD at your next visit, and again as instructed by your Primary MD.  Get Medicines reviewed and adjusted: Please take all your medications with you for your next visit with your Primary MD  Laboratory/radiological data: Please request your Primary MD to go over all hospital tests and procedure/radiological results at the follow up, please ask your Primary MD to get all Hospital records sent to his/her office.  In some cases, they will be blood work, cultures and biopsy results pending at the time of your discharge. Please request that your primary care M.D. follows up on these results.  Also Note the following: If you experience worsening of your admission symptoms, develop shortness of breath, life threatening emergency, suicidal or homicidal thoughts you must seek medical attention immediately by calling 911 or calling your MD immediately  if symptoms less severe.  You  must read complete instructions/literature along with all the possible adverse reactions/side effects for all the Medicines you take and that have been prescribed to you. Take any new Medicines after you have completely understood and accpet all the possible adverse reactions/side effects.   Do not drive when taking Pain medications or sleeping medications (Benzodaizepines)  Do not take more than prescribed Pain, Sleep and Anxiety Medications. It is not advisable to combine anxiety,sleep and pain medications without talking with your primary care practitioner  Special Instructions: If you have smoked or chewed Tobacco  in the last 2 yrs please stop smoking, stop any regular Alcohol  and or any Recreational drug use.  Wear Seat belts while driving.  Please note: You were cared for by a hospitalist during your hospital stay. Once you are discharged, your primary care physician will handle any further medical issues. Please note that NO REFILLS for any discharge medications will be authorized once you are discharged, as it is imperative that you return to your primary care physician (or establish a relationship with a primary care physician if you do not have one) for your post hospital discharge needs so that they can reassess your need for medications and monitor your lab values.   Check CBGs before meals and at bedtime.   Increase activity slowly   Complete by: As directed      Allergies as of 10/14/2020  Reactions   Amlodipine Swelling, Other (See Comments)   Legs became swollen   Onglyza [saxagliptin] Other (See Comments)   "Allergic," per [facility]   Zyprexa [olanzapine] Other (See Comments)   Allergy noted by ACT Team. No reaction specified (??)- "Allergic," per [facility]      Medication List    STOP taking these medications   amLODipine 10 MG tablet Commonly known as: NORVASC   aspirin 81 MG EC tablet   doxycycline 100 MG tablet Commonly known as:  VIBRA-TABS   furosemide 40 MG tablet Commonly known as: LASIX   Lantus SoloStar 100 UNIT/ML Solostar Pen Generic drug: insulin glargine   lisinopril 40 MG tablet Commonly known as: ZESTRIL   metoprolol succinate 50 MG 24 hr tablet Commonly known as: TOPROL-XL     TAKE these medications   acetaminophen 500 MG tablet Commonly known as: TYLENOL Take 500-1,000 mg by mouth every 8 (eight) hours as needed for mild pain or headache.   ARIPiprazole 20 MG tablet Commonly known as: ABILIFY Take 1 tablet (20 mg total) by mouth daily.   atorvastatin 10 MG tablet Commonly known as: LIPITOR Take 1 tablet (10 mg total) by mouth daily at 6 PM. What changed: when to take this   buPROPion 75 MG tablet Commonly known as: WELLBUTRIN Take 1 tablet (75 mg total) by mouth daily.   clopidogrel 75 MG tablet Commonly known as: PLAVIX Take 75 mg by mouth daily.   divalproex 500 MG DR tablet Commonly known as: DEPAKOTE Take 1 tablet (500 mg total) by mouth every 12 (twelve) hours.   empagliflozin 10 MG Tabs tablet Commonly known as: JARDIANCE Take 10 mg by mouth daily.   glimepiride 2 MG tablet Commonly known as: AMARYL Take 2 mg by mouth daily with breakfast.   insulin aspart 100 UNIT/ML injection Commonly known as: novoLOG 0-15 Units, Subcutaneous, 3 times daily with meals CBG < 70: Implement Hypoglycemia measures CBG 70 - 120: 0 units CBG 121 - 150: 2 units CBG 151 - 200: 3 units CBG 201 - 250: 5 units CBG 251 - 300: 8 units CBG 301 - 350: 11 units CBG 351 - 400: 15 units CBG > 400: call MD   liothyronine 25 MCG tablet Commonly known as: CYTOMEL Take 25 mcg by mouth daily before breakfast.   mirtazapine 15 MG disintegrating tablet Commonly known as: REMERON SOL-TAB Take 1 tablet (15 mg total) by mouth at bedtime.   pantoprazole 40 MG tablet Commonly known as: PROTONIX Take 1 tablet (40 mg total) by mouth daily at 12 noon.   polyethylene glycol powder 17 GM/SCOOP  powder Commonly known as: GLYCOLAX/MIRALAX Take 17 g by mouth daily. What changed:   when to take this  additional instructions   QUEtiapine 25 MG tablet Commonly known as: SEROQUEL Take 25 mg by mouth 2 (two) times daily.   QUEtiapine 300 MG tablet Commonly known as: SEROQUEL Take 300 mg by mouth at bedtime.   rivaroxaban 20 MG Tabs tablet Commonly known as: XARELTO Take 1 tablet (20 mg total) by mouth daily with supper. What changed: when to take this       Follow-up Information    [name], MD. Schedule an appointment as soon as possible for a visit in 1 week(s).   Specialty: Family Medicine Contact information: [phone] [place] [address] [phone]              Allergies  Allergen Reactions  .  Amlodipine Swelling and Other (See Comments)    Legs became swollen  . Onglyza [Saxagliptin] Other (See Comments)    "Allergic," per [facility]  . Zyprexa [Olanzapine] Other (See Comments)    Allergy noted by ACT Team. No reaction specified (??)- "Allergic," per [facility]       Other Procedures/Studies: CT Head Wo Contrast  Result Date: 10/09/2020 CLINICAL DATA:  Stroke, follow-up. EXAM: CT HEAD WITHOUT CONTRAST TECHNIQUE: Contiguous axial images were obtained from the base of the skull through the vertex without intravenous contrast. COMPARISON:  MRI/MRA head 10/07/2020.  Head CT 10/07/2020. FINDINGS: Brain: Mild generalized cerebral atrophy. Minimal ill-defined hypoattenuation within the cerebral white matter is nonspecific, but compatible with chronic small vessel ischemic disease. There is no acute intracranial hemorrhage. No demarcated cortical infarct is identified. No extra-axial fluid collection. No evidence of intracranial mass. No midline shift. Vascular: No hyperdense vessel.  Atherosclerotic calcifications. Skull: Normal. Negative for fracture or focal lesion. Sinuses/Orbits: Visualized orbits show no acute  finding. Mild ethmoid sinus mucosal thickening. Other: Small right mastoid effusion. IMPRESSION: No evidence of acute intracranial abnormality. Mild cerebral atrophy and chronic small vessel ischemic disease. Mild ethmoid sinus mucosal thickening. Small right mastoid effusion. Electronically Signed   By: Jackey Loge DO   On: 10/09/2020 07:31   CT HEAD WO CONTRAST  Result Date: 10/07/2020 CLINICAL DATA:  Neurologic deficit EXAM: CT HEAD WITHOUT CONTRAST TECHNIQUE: Contiguous axial images were obtained from the base of the skull through the vertex without intravenous contrast. COMPARISON:  08/22/2018 FINDINGS: Brain: No acute infarct or hemorrhage. Lateral ventricles and midline structures are unremarkable. No acute extra-axial fluid collections. No mass effect. Vascular: No hyperdense vessel. There is severe atherosclerosis of the bilateral internal carotid arteries, stable. Skull: Normal. Negative for fracture or focal lesion. Sinuses/Orbits: Minimal fluid within the right mastoid air cells. Paranasal sinuses are clear. Other: None. IMPRESSION: 1. No acute intrathoracic process. 2. Minimal fluid within the right mastoid air cells. 3. Stable atherosclerosis. Electronically Signed   By: Sharlet Salina M.D.   On: 10/07/2020 17:46   MR ANGIO HEAD WO CONTRAST  Result Date: 10/07/2020 CLINICAL DATA:  69 year old male with altered mental status, neurologic deficit, TIA. EXAM: MRA HEAD WITHOUT CONTRAST TECHNIQUE: Angiographic images of the Circle of Willis were obtained using MRA technique without intravenous contrast. COMPARISON:  Brain MRI today.  Intracranial MRA 03/07/2011. FINDINGS: Severe stenosis of the diminutive distal right vertebral artery demonstrated in 2012, now with essentially absent flow signal in the right V4 segment. The distal left vertebral artery remains patent. Visible left PICA remains patent. The left vertebral supplies the basilar with moderate to severe increased stenosis at the left  vertebrobasilar junction (series 1052, image 11. There is minimal reconstitution of the right vertebrobasilar junction. Patent basilar artery with chronic mild irregularity and stenosis. SCA and left PCA origins remain patent. Fetal right PCA origin again noted. Diminutive or absent left posterior communicating artery. Moderate to severe bilateral PCA P2 segment stenoses are chronic (image 1046 image 18 and 19) and not significantly changed. Preserved distal PCA flow signal. Antegrade flow in both ICA siphons. Severe bilateral distal ICA irregularity and stenosis, on the left just proximal to the ICA terminus (series 1040, image 7) and on the right at the anterior genu (same image). These have substantially progressed since 2012. Carotid termini remain patent. Mild to moderate right ACA origin stenosis is increased. Anterior communicating artery and visible ACA branches are otherwise within normal limits. Left MCA  M1 mild irregularity and stenosis has not significantly changed. Left MCA trifurcation remains patent. Visible left MCA branches are stable. Moderate chronic right MCA M1 stenosis is stable (series 10 40 image 5). Mild to moderate stenosis also at the right MCA bifurcation is stable (image 13). Visible right MCA branches appear stable. IMPRESSION: 1. Chronically advanced intracranial atherosclerosis. 2. The following have progressed since a 2012 MRA: - occlusion of the non-dominant distal Right Vertebral Artery. - Moderate to Severe stenosis at the Left Vertebrobasilar Junction. - Severe stenosis of both ICA siphons (right anterior genu and left supraclinoid segment). - mild to moderate Right ACA origin stenosis. 3. The following are stable since 2012: - Moderate Right MCA M1 stenosis. - Moderate to Severe bilateral PCA P2 stenoses. Electronically Signed   By: Odessa Fleming M.D.   On: 10/07/2020 19:34   MR BRAIN WO CONTRAST  Result Date: 10/07/2020 CLINICAL DATA:  69 year old male with altered mental status,  neurologic deficit, TIA. EXAM: MRI HEAD WITHOUT CONTRAST TECHNIQUE: Multiplanar, multiecho pulse sequences of the brain and surrounding structures were obtained without intravenous contrast. COMPARISON:  Head CT earlier today.  Brain MRI 03/07/2011. FINDINGS: Brain: No restricted diffusion to suggest acute infarction. No midline shift, mass effect, evidence of mass lesion, ventriculomegaly, extra-axial collection or acute intracranial hemorrhage. Cervicomedullary junction and pituitary are within normal limits. Mild for age vague, patchy cerebral white matter T2 and FLAIR hyperintensity is stable. No cortical encephalomalacia identified. Chronic lacunar infarct medial left pons is subtle and was better visualized in 2012. No chronic cerebral blood products identified. Deep gray matter nuclei and cerebellum remain within normal limits. Vascular: Major intracranial vascular flow voids appear stable since 2012 including decreased distal right vertebral artery flow void (series 15, image 3 today). Generalized intracranial artery tortuosity again noted. Skull and upper cervical spine: Visible bone marrow signal and cervical spine appear stable since 2012. Sinuses/Orbits: Stable, negative.  Hyperplastic paranasal sinuses. Other: Mild chronic mastoid effusions, regressed on the left but increased on the right since 2012. Still, significance is doubtful. Other visible internal auditory structures appear normal. Visible scalp and face appear negative. IMPRESSION: 1. No acute intracranial abnormality. 2. Mild for age chronic small vessel disease, not significantly changed since 2012. 3. Evidence of chronically decreased flow in the distal right vertebral artery. See also MRA today reported separately. Electronically Signed   By: Odessa Fleming M.D.   On: 10/07/2020 19:25   US RENAL  Result Date: 10/07/2020 CLINICAL DATA:  Acute kidney injury EXAM: RENAL / URINARY TRACT ULTRASOUND COMPLETE COMPARISON:  Renal ultrasound  07/31/2014, CT abdomen pelvis 08/01/2014 FINDINGS: Right Kidney: Renal measurements: 11.2 x 5.4 x 4.4 cm = volume: 139 mL. Echogenicity is within normal limits. No concerning renal mass, shadowing calculus or hydronephrosis. Left Kidney: Renal measurements: 11.9 x 6.1 x 5.8 cm = volume: 219 mL. Echogenicity is within normal limits. No concerning renal mass, shadowing calculus or hydronephrosis. Bladder: Moderate bladder distention without wall thickening, calculi or debris. Other: None. IMPRESSION: Moderate bladder distension without other concerning bladder abnormality. Otherwise unremarkable renal ultrasound. Electronically Signed   By: Kreg Shropshire M.D.   On: 10/07/2020 19:35   DG Chest Portable 1 View  Result Date: 10/07/2020 CLINICAL DATA:  Slurred speech EXAM: PORTABLE CHEST 1 VIEW COMPARISON:  Mar 31, 2020 FINDINGS: No edema or airspace opacity. Heart is slightly enlarged with pulmonary vascularity normal. No adenopathy. No bone lesions. IMPRESSION: Heart slightly enlarged.  No edema or airspace opacity. Electronically Signed   By:  Bretta Bang III M.D.   On: 10/07/2020 15:01   EEG adult  Result Date: 10/09/2020 Charlsie Quest, MD     10/09/2020 11:06 AM Patient Name: Delynn Pursley Savona MRN: [id number] Epilepsy Attending: Charlsie Quest Referring Physician/Provider: Dr Delia Heady Date: 10/08/2020 Duration: 25.15 mins Patient history: 69 year old male with encephalopathy as well as speech disturbance. EEG to evaluate for seizures. Level of alertness: Awake, drowsy AEDs during EEG study: Depakote Technical aspects: This EEG study was done with scalp electrodes positioned according to the 10-20 International system of electrode placement. Electrical activity was acquired at a sampling rate of  and reviewed with a high frequency filter of  and a low frequency filter of . EEG data were recorded continuously and digitally stored. Description: The posterior dominant rhythm consists of  8-9 Hz activity of moderate voltage (25-35 uV) seen predominantly in posterior head regions, symmetric and reactive to eye opening and eye closing. Drowsiness was characterized by attenuation of the posterior background rhythm.  Hyperventilation and photic stimulation were not performed.   IMPRESSION: This study is within normal limits. No seizures or epileptiform discharges were seen throughout the recording. Charlsie Quest   ECHOCARDIOGRAM COMPLETE  Result Date: 10/08/2020    ECHOCARDIOGRAM REPORT   Patient Name:   BRESLIN HEMANN Date of Exam: 10/08/2020 Medical Rec #:  [id number]        Height:       72.0 in Accession #:    [id number]       Weight:       281.0 lb Date of Birth:  Jan 04, 1951        BSA:          2.462 m Patient Age:    39 years         BP:           123/72 mmHg Patient Gender: M                HR:           86 bpm. Exam Location:  Inpatient Procedure: 2D Echo and Intracardiac Opacification Agent Indications:    TIA 435.9 / G45.9  History:        Patient has prior history of Echocardiogram examinations, most                 recent 03/18/2020. Risk Factors:Hypertension, Diabetes and Sleep                 Apnea. Chronic kidney disease.  Sonographer:    Leta Jungling RDCS Referring Phys: [id number] Emeline General  Sonographer Comments: Technically difficult study due to poor echo windows. IMPRESSIONS  1. Left ventricular ejection fraction, by estimation, is 65 to 70%. The left ventricle has normal function. The left ventricle has no regional wall motion abnormalities. There is mild left ventricular hypertrophy. Left ventricular diastolic parameters are consistent with Grade I diastolic dysfunction (impaired relaxation).  2. Right ventricular systolic function is normal. The right ventricular size is normal.  3. The mitral valve is normal in structure. No evidence of mitral valve regurgitation. No evidence of mitral stenosis. Severe mitral annular calcification.  4. The aortic valve is tricuspid.  There is moderate calcification of the aortic valve. There is moderate thickening of the aortic valve. Aortic valve regurgitation is not visualized. Mild to moderate aortic valve sclerosis/calcification is present, without any evidence of aortic stenosis.  5. The inferior vena cava is normal in size with greater than 50%  respiratory variability, suggesting right atrial pressure of 3 mmHg. Conclusion(s)/Recommendation(s): No intracardiac source of embolism detected on this transthoracic study. A transesophageal echocardiogram is recommended to exclude cardiac source of embolism if clinically indicated. FINDINGS  Left Ventricle: Left ventricular ejection fraction, by estimation, is 65 to 70%. The left ventricle has normal function. The left ventricle has no regional wall motion abnormalities. Definity contrast agent was given IV to delineate the left ventricular  endocardial borders. The left ventricular internal cavity size was normal in size. There is mild left ventricular hypertrophy. Left ventricular diastolic parameters are consistent with Grade I diastolic dysfunction (impaired relaxation). Right Ventricle: The right ventricular size is normal. No increase in right ventricular wall thickness. Right ventricular systolic function is normal. Left Atrium: Left atrial size was normal in size. Right Atrium: Right atrial size was normal in size. Pericardium: There is no evidence of pericardial effusion. Mitral Valve: The mitral valve is normal in structure. There is moderate thickening of the mitral valve leaflet(s). There is moderate calcification of the mitral valve leaflet(s). Severe mitral annular calcification. No evidence of mitral valve regurgitation. No evidence of mitral valve stenosis. Tricuspid Valve: The tricuspid valve is normal in structure. Tricuspid valve regurgitation is not demonstrated. No evidence of tricuspid stenosis. Aortic Valve: The aortic valve is tricuspid. There is moderate calcification of  the aortic valve. There is moderate thickening of the aortic valve. Aortic valve regurgitation is not visualized. Mild to moderate aortic valve sclerosis/calcification is present, without any evidence of aortic stenosis. Pulmonic Valve: The pulmonic valve was normal in structure. Pulmonic valve regurgitation is not visualized. No evidence of pulmonic stenosis. Aorta: The aortic root is normal in size and structure. Venous: The inferior vena cava is normal in size with greater than 50% respiratory variability, suggesting right atrial pressure of 3 mmHg. IAS/Shunts: No atrial level shunt detected by color flow Doppler.  LEFT VENTRICLE PLAX 2D LVIDd:         4.20 cm      Diastology LVIDs:         3.05 cm      LV e' medial:    5.00 cm/s LV PW:         1.00 cm      LV E/e' medial:  12.9 LV IVS:        1.20 cm      LV e' lateral:   6.09 cm/s LVOT diam:     2.05 cm      LV E/e' lateral: 10.6 LV SV:         50 LV SV Index:   20 LVOT Area:     3.30 cm  LV Volumes (MOD) LV vol d, MOD A2C: 78.6 ml LV vol d, MOD A4C: 172.0 ml LV vol s, MOD A2C: 31.1 ml LV vol s, MOD A4C: 46.9 ml LV SV MOD A2C:     47.5 ml LV SV MOD A4C:     172.0 ml LV SV MOD BP:      82.0 ml RIGHT VENTRICLE TAPSE (M-mode): 2.3 cm LEFT ATRIUM             Index       RIGHT ATRIUM           Index LA diam:        3.50 cm 1.42 cm/m  RA Area:     14.60 cm LA Vol (A2C):   43.0 ml 17.47 ml/m RA Volume:   30.30 ml  12.31 ml/m LA Vol (  A4C):   42.3 ml 17.18 ml/m LA Biplane Vol: 44.1 ml 17.91 ml/m  AORTIC VALVE LVOT Vmax:   91.60 cm/s LVOT Vmean:  63.800 cm/s LVOT VTI:    0.150 m  AORTA Ao Root diam: 3.30 cm Ao Asc diam:  3.40 cm MITRAL VALVE MV Area (PHT): 4.54 cm    SHUNTS MV Decel Time: 167 msec    Systemic VTI:  0.15 m MV E velocity: 64.50 cm/s  Systemic Diam: 2.05 cm MV A velocity: 90.30 cm/s MV E/A ratio:  0.71 Donato Schultz MD Electronically signed by Donato Schultz MD Signature Date/Time: 10/08/2020/11:31:40 AM    Final    VAS US CAROTID  Result Date:  10/09/2020 Carotid Arterial Duplex Study Comparison Study:  09/02/2015- carotid artery duplex Performing Technologist: Gertie Fey MHA, RDMS, RVT, RDCS  Examination Guidelines: A complete evaluation includes B-mode imaging, spectral Doppler, color Doppler, and power Doppler as needed of all accessible portions of each vessel. Bilateral testing is considered an integral part of a complete examination. Limited examinations for reoccurring indications may be performed as noted.  Right Carotid Findings: +----------+--------+--------+--------+--------------------------+--------+           PSV cm/sEDV cm/sStenosisPlaque Description        Comments +----------+--------+--------+--------+--------------------------+--------+ CCA Prox  93      21                                                 +----------+--------+--------+--------+--------------------------+--------+ CCA Distal107     24              smooth and heterogenous            +----------+--------+--------+--------+--------------------------+--------+ ICA Prox  117     19              heterogenous and irregular         +----------+--------+--------+--------+--------------------------+--------+ ICA Distal75      31                                                 +----------+--------+--------+--------+--------------------------+--------+ ECA       263     24              heterogenous and irregular         +----------+--------+--------+--------+--------------------------+--------+ +----------+--------+-------+----------------+-------------------+           PSV cm/sEDV cmsDescribe        Arm Pressure (mmHG) +----------+--------+-------+----------------+-------------------+ [id number]            Multiphasic, WNL                    +----------+--------+-------+----------------+-------------------+ +---------+--------+--+--------+---------+ VertebralPSV cm/s34EDV cm/sAntegrade  +---------+--------+--+--------+---------+  Left Carotid Findings: +----------+--------+--------+--------+-----------------------+--------+           PSV cm/sEDV cm/sStenosisPlaque Description     Comments +----------+--------+--------+--------+-----------------------+--------+ CCA Prox  159     26              smooth and heterogenous         +----------+--------+--------+--------+-----------------------+--------+ CCA Distal171     21              smooth and heterogenous         +----------+--------+--------+--------+-----------------------+--------+ ICA Prox  62  11              smooth and heterogenous         +----------+--------+--------+--------+-----------------------+--------+ ICA Distal72      23                                              +----------+--------+--------+--------+-----------------------+--------+ ECA       185     17                                              +----------+--------+--------+--------+-----------------------+--------+ +----------+--------+--------+----------------+-------------------+           PSV cm/sEDV cm/sDescribe        Arm Pressure (mmHG) +----------+--------+--------+----------------+-------------------+ [id number]              Multiphasic, WNL                    +----------+--------+--------+----------------+-------------------+ +---------+--------+--+--------+--+---------+ VertebralPSV cm/s39EDV cm/s11Antegrade +---------+--------+--+--------+--+---------+   Summary: Right Carotid: Velocities in the right ICA are consistent with a 1-39% stenosis.                The ECA appears >50% stenosed. Left Carotid: Velocities in the left ICA are consistent with a 1-39% stenosis. Vertebrals:  Bilateral vertebral arteries demonstrate antegrade flow. Right              vertebral artery demonstrates high resistant flow. Subclavians: Normal flow hemodynamics were seen in bilateral subclavian              arteries.  *See table(s) above for measurements and observations.  Electronically signed by Coral Else MD on 10/09/2020 at 7:33:18 AM.    Final      TODAY-DAY OF DISCHARGE:  Subjective:   Shirlean Schlein today has no headache,no chest abdominal pain,no new weakness tingling or numbness, feels much better wants to go home today.   Objective:   Blood pressure 111/82, pulse 87, temperature 98.4 F (36.9 C), temperature source Oral, resp. rate 20, height 6' (1.829 m), weight 127.5 kg, SpO2 97 %.  Intake/Output Summary (Last 24 hours) at 10/14/2020 0935 Last data filed at 10/14/2020 0338 Gross per 24 hour  Intake 480 ml  Output 1500 ml  Net -1020 ml   Filed Weights   10/07/20 1457  Weight: 127.5 kg    Exam: Awake Alert, Oriented *3, No new F.N deficits, Normal affect [place].AT,PERRAL Supple Neck,No JVD, No cervical lymphadenopathy appriciated.  Symmetrical Chest wall movement, Good air movement bilaterally, CTAB RRR,No Gallops,Rubs or new Murmurs, No Parasternal Heave +ve B.Sounds, Abd Soft, Non tender, No organomegaly appriciated, No rebound -guarding or rigidity. No Cyanosis, Clubbing or edema, No new Rash or bruise   PERTINENT RADIOLOGIC STUDIES: No results found.   PERTINENT LAB RESULTS: CBC: Recent Labs    10/13/20 0427  WBC 5.7  HGB 12.0*  HCT 40.6  PLT 196   CMET CMP     Component Value Date/Time   NA 129 (L) 10/13/2020 0427   NA 139 08/10/2014 1327   K 4.5 10/13/2020 0427   K 4.3 08/10/2014 1327   CL 97 (L) 10/13/2020 0427   CL 105 08/10/2014 1327   CO2 24 10/13/2020 0427   CO2 27 08/10/2014 1327   GLUCOSE 125 (H)  10/13/2020 0427   GLUCOSE 109 (H) 08/10/2014 1327   BUN 49 (H) 10/13/2020 0427   BUN 13 08/10/2014 1327   CREATININE 2.13 (H) 10/13/2020 0427   CREATININE 1.25 08/10/2014 1327   CALCIUM 8.8 (L) 10/13/2020 0427   CALCIUM 8.8 08/10/2014 1327   PROT 7.5 10/07/2020 1534   PROT 7.0 08/10/2014 1327   ALBUMIN 3.5 10/07/2020 1534   ALBUMIN 3.3 (L)  08/10/2014 1327   AST 14 (L) 10/07/2020 1534   AST 96 (H) 08/10/2014 1327   ALT 13 10/07/2020 1534   ALT 187 (H) 08/10/2014 1327   ALKPHOS 52 10/07/2020 1534   ALKPHOS 81 08/10/2014 1327   BILITOT 0.7 10/07/2020 1534   BILITOT 0.3 08/10/2014 1327   GFRNONAA 33 (L) 10/13/2020 0427   GFRNONAA >60 08/10/2014 1327   GFRAA 52 (L) 04/01/2020 0155   GFRAA >60 08/10/2014 1327    GFR Estimated Creatinine Clearance: 46.5 mL/min (A) (by C-G formula based on SCr of 2.13 mg/dL (H)). No results for input(s): LIPASE, AMYLASE in the last 72 hours. No results for input(s): CKTOTAL, CKMB, CKMBINDEX, TROPONINI in the last 72 hours. Invalid input(s): POCBNP No results for input(s): DDIMER in the last 72 hours. No results for input(s): HGBA1C in the last 72 hours. No results for input(s): CHOL, HDL, LDLCALC, TRIG, CHOLHDL, LDLDIRECT in the last 72 hours. No results for input(s): TSH, T4TOTAL, T3FREE, THYROIDAB in the last 72 hours.  Invalid input(s): FREET3 No results for input(s): VITAMINB12, FOLATE, FERRITIN, TIBC, IRON, RETICCTPCT in the last 72 hours. Coags: No results for input(s): INR in the last 72 hours.  Invalid input(s): PT Microbiology: Recent Results (from the past 240 hour(s))  Respiratory Panel by RT PCR (Flu A&B, Covid) - Nasopharyngeal Swab     Status: None   Collection Time: 10/07/20  3:38 PM   Specimen: Nasopharyngeal Swab  Result Value Ref Range Status   SARS Coronavirus 2 by RT PCR NEGATIVE NEGATIVE Final    Comment: (NOTE) SARS-CoV-2 target nucleic acids are NOT DETECTED.  The SARS-CoV-2 RNA is generally detectable in upper respiratoy specimens during the acute phase of infection. The lowest concentration of SARS-CoV-2 viral copies this assay can detect is 131 copies/mL. A negative result does not preclude SARS-Cov-2 infection and should not be used as the sole basis for treatment or other patient management decisions. A negative result may occur with  improper specimen  collection/handling, submission of specimen other than nasopharyngeal swab, presence of viral mutation(s) within the areas targeted by this assay, and inadequate number of viral copies (<131 copies/mL). A negative result must be combined with clinical observations, patient history, and epidemiological information. The expected result is Negative.  Fact Sheet for Patients:  https://www.moore.com/  Fact Sheet for Healthcare Providers:  https://www.young.biz/  This test is no t yet approved or cleared by the [place] FDA and  has been authorized for detection and/or diagnosis of SARS-CoV-2 by FDA under an Emergency Use Authorization (EUA). This EUA will remain  in effect (meaning this test can be used) for the duration of the COVID-19 declaration under Section 564(b)(1) of the Act, 21 U.S.C. section 360bbb-3(b)(1), unless the authorization is terminated or revoked sooner.     Influenza A by PCR NEGATIVE NEGATIVE Final   Influenza B by PCR NEGATIVE NEGATIVE Final    Comment: (NOTE) The Xpert Xpress SARS-CoV-2/FLU/RSV assay is intended as an aid in  the diagnosis of influenza from Nasopharyngeal swab specimens and  should not be used as a sole  basis for treatment. Nasal washings and  aspirates are unacceptable for Xpert Xpress SARS-CoV-2/FLU/RSV  testing.  Fact Sheet for Patients: https://www.moore.com/  Fact Sheet for Healthcare Providers: https://www.young.biz/  This test is not yet approved or cleared by the [place] FDA and  has been authorized for detection and/or diagnosis of SARS-CoV-2 by  FDA under an Emergency Use Authorization (EUA). This EUA will remain  in effect (meaning this test can be used) for the duration of the  Covid-19 declaration under Section 564(b)(1) of the Act, 21  U.S.C. section 360bbb-3(b)(1), unless the authorization is  terminated or revoked. Performed at [facility], [address], [place], [place] [address]   Respiratory Panel by RT PCR (Flu A&B, Covid) - Nasopharyngeal Swab     Status: None   Collection Time: 10/10/20 12:36 PM   Specimen: Nasopharyngeal Swab  Result Value Ref Range Status   SARS Coronavirus 2 by RT PCR NEGATIVE NEGATIVE Final    Comment: (NOTE) SARS-CoV-2 target nucleic acids are NOT DETECTED.  The SARS-CoV-2 RNA is generally detectable in upper respiratoy specimens during the acute phase of infection. The lowest concentration of SARS-CoV-2 viral copies this assay can detect is 131 copies/mL. A negative result does not preclude SARS-Cov-2 infection and should not be used as the sole basis for treatment or other patient management decisions. A negative result may occur with  improper specimen collection/handling, submission of specimen other than nasopharyngeal swab, presence of viral mutation(s) within the areas targeted by this assay, and inadequate number of viral copies (<131 copies/mL). A negative result must be combined with clinical observations, patient history, and epidemiological information. The expected result is Negative.  Fact Sheet for Patients:  https://www.moore.com/  Fact Sheet for Healthcare Providers:  https://www.young.biz/  This test is no t yet approved or cleared by the [place] FDA and  has been authorized for detection and/or diagnosis of SARS-CoV-2 by FDA under an Emergency Use Authorization (EUA). This EUA will remain  in effect (meaning this test can be used) for the duration of the COVID-19 declaration under Section 564(b)(1) of the Act, 21 U.S.C. section 360bbb-3(b)(1), unless the authorization is terminated or revoked sooner.     Influenza A by PCR NEGATIVE NEGATIVE Final   Influenza B by PCR NEGATIVE NEGATIVE Final    Comment: (NOTE) The Xpert Xpress SARS-CoV-2/FLU/RSV assay is intended as an aid in  the diagnosis of influenza  from Nasopharyngeal swab specimens and  should not be used as a sole basis for treatment. Nasal washings and  aspirates are unacceptable for Xpert Xpress SARS-CoV-2/FLU/RSV  testing.  Fact Sheet for Patients: https://www.moore.com/  Fact Sheet for Healthcare Providers: https://www.young.biz/  This test is not yet approved or cleared by the [place] FDA and  has been authorized for detection and/or diagnosis of SARS-CoV-2 by  FDA under an Emergency Use Authorization (EUA). This EUA will remain  in effect (meaning this test can be used) for the duration of the  Covid-19 declaration under Section 564(b)(1) of the Act, 21  U.S.C. section 360bbb-3(b)(1), unless the authorization is  terminated or revoked. Performed at [facility], [address] [place], [place] [address]   Respiratory Panel by RT PCR (Flu A&B, Covid) - Nasopharyngeal Swab     Status: None   Collection Time: 10/13/20  1:34 PM   Specimen: Nasopharyngeal Swab  Result Value Ref Range Status   SARS Coronavirus 2 by RT PCR NEGATIVE NEGATIVE Final    Comment: (NOTE) SARS-CoV-2  target nucleic acids are NOT DETECTED.  The SARS-CoV-2 RNA is generally detectable in upper respiratoy specimens during the acute phase of infection. The lowest concentration of SARS-CoV-2 viral copies this assay can detect is 131 copies/mL. A negative result does not preclude SARS-Cov-2 infection and should not be used as the sole basis for treatment or other patient management decisions. A negative result may occur with  improper specimen collection/handling, submission of specimen other than nasopharyngeal swab, presence of viral mutation(s) within the areas targeted by this assay, and inadequate number of viral copies (<131 copies/mL). A negative result must be combined with clinical observations, patient history, and epidemiological information. The expected result is Negative.  Fact Sheet  for Patients:  https://www.moore.com/  Fact Sheet for Healthcare Providers:  https://www.young.biz/  This test is no t yet approved or cleared by the [place] FDA and  has been authorized for detection and/or diagnosis of SARS-CoV-2 by FDA under an Emergency Use Authorization (EUA). This EUA will remain  in effect (meaning this test can be used) for the duration of the COVID-19 declaration under Section 564(b)(1) of the Act, 21 U.S.C. section 360bbb-3(b)(1), unless the authorization is terminated or revoked sooner.     Influenza A by PCR NEGATIVE NEGATIVE Final   Influenza B by PCR NEGATIVE NEGATIVE Final    Comment: (NOTE) The Xpert Xpress SARS-CoV-2/FLU/RSV assay is intended as an aid in  the diagnosis of influenza from Nasopharyngeal swab specimens and  should not be used as a sole basis for treatment. Nasal washings and  aspirates are unacceptable for Xpert Xpress SARS-CoV-2/FLU/RSV  testing.  Fact Sheet for Patients: https://www.moore.com/  Fact Sheet for Healthcare Providers: https://www.young.biz/  This test is not yet approved or cleared by the [place] FDA and  has been authorized for detection and/or diagnosis of SARS-CoV-2 by  FDA under an Emergency Use Authorization (EUA). This EUA will remain  in effect (meaning this test can be used) for the duration of the  Covid-19 declaration under Section 564(b)(1) of the Act, 21  U.S.C. section 360bbb-3(b)(1), unless the authorization is  terminated or revoked. Performed at [facility], [address] [place], [place] [address]     FURTHER DISCHARGE INSTRUCTIONS:  Get Medicines reviewed and adjusted: Please take all your medications with you for your next visit with your Primary MD  Laboratory/radiological data: Please request your Primary MD to go over all hospital tests and procedure/radiological results at the follow up,  please ask your Primary MD to get all Hospital records sent to his/her office.  In some cases, they will be blood work, cultures and biopsy results pending at the time of your discharge. Please request that your primary care M.D. goes through all the records of your hospital data and follows up on these results.  Also Note the following: If you experience worsening of your admission symptoms, develop shortness of breath, life threatening emergency, suicidal or homicidal thoughts you must seek medical attention immediately by calling 911 or calling your MD immediately  if symptoms less severe.  You must read complete instructions/literature along with all the possible adverse reactions/side effects for all the Medicines you take and that have been prescribed to you. Take any new Medicines after you have completely understood and accpet all the possible adverse reactions/side effects.   Do not drive when taking Pain medications or sleeping medications (Benzodaizepines)  Do not take more than prescribed Pain, Sleep and Anxiety Medications. It is not advisable to combine anxiety,sleep and  pain medications without talking with your primary care practitioner  Special Instructions: If you have smoked or chewed Tobacco  in the last 2 yrs please stop smoking, stop any regular Alcohol  and or any Recreational drug use.  Wear Seat belts while driving.  Please note: You were cared for by a hospitalist during your hospital stay. Once you are discharged, your primary care physician will handle any further medical issues. Please note that NO REFILLS for any discharge medications will be authorized once you are discharged, as it is imperative that you return to your primary care physician (or establish a relationship with a primary care physician if you do not have one) for your post hospital discharge needs so that they can reassess your need for medications and monitor your lab values.  Total Time spent  coordinating discharge including counseling, education and face to face time equals 35 minutes.  SignedJeoffrey Massed 10/14/2020 9:35 AM

## 2020-10-13 NOTE — Progress Notes (Signed)
Obtained updated insurance authorization for patient. Auth ID [id number], with updates due 11/17. Fax [phone] and [facility]. Pending bed availability to discharge to [facility] today, spoke with Admissions and awaiting call back.  Blenda Nicely, [place] Clinical Social Worker [phone]

## 2020-10-13 NOTE — TOC Progression Note (Signed)
Transition of Care [facility]) - Progression Note    Patient Details  Name: Christian [name] MRN: [id number] Date of Birth: 10-Apr-1951  [facility] CM/SW Contact  Christian [name], [place] Phone Number: 10/13/2020, 4:29 PM  Clinical Narrative:   CSW coordinated to get the patient to [facility] today (obtained new insurance approval, Christian [name] said they had a bed available, and CSW could setup transport after COVID test resulted). Later this afternoon, [facility] called back indicating that they had declined on the patient (it had been accepted and selected this morning). Christian [name] indicated that they had concerns about the patient's behaviors. CSW attempted to discuss how the patient has been calm and cooperative, no behavior issues, but Christian [name] continued to decline. Patient has no other bed offers at this time. CSW asked [facility] and [facility] to review. CSW contacted daughter Christian [name] to update her. CSW to follow.    Expected Discharge Plan: Skilled Nursing Facility Barriers to Discharge: Other (comment)  Expected Discharge Plan and Services Expected Discharge Plan: Skilled Nursing Facility     Post Acute Care Choice: Skilled Nursing Facility Living arrangements for the past 2 months: Apartment Expected Discharge Date: 10/10/20                                     Social Determinants of Health (SDOH) Interventions    Readmission Risk Interventions No flowsheet data found.

## 2020-10-13 NOTE — Therapy (Signed)
Pt assessed and no cpap in room pt refused

## 2020-10-13 NOTE — Care Management Important Message (Signed)
Important Message  Patient Details  Name: Christian [name] MRN: [id number] Date of Birth: January 21, 1951   Medicare Important Message Given:  Yes     Dorena Bodo 10/13/2020, 3:30 PM

## 2020-10-14 DIAGNOSIS — R4182 Altered mental status, unspecified: Secondary | ICD-10-CM

## 2020-10-14 DIAGNOSIS — N179 Acute kidney failure, unspecified: Secondary | ICD-10-CM

## 2020-10-14 LAB — GLUCOSE, CAPILLARY
Glucose-Capillary: 152 mg/dL — ABNORMAL HIGH (ref 70–99)
Glucose-Capillary: 169 mg/dL — ABNORMAL HIGH (ref 70–99)
Glucose-Capillary: 166 mg/dL — ABNORMAL HIGH (ref 70–99)
Glucose-Capillary: 167 mg/dL — ABNORMAL HIGH (ref 70–99)

## 2020-10-14 MED ORDER — POLYETHYLENE GLYCOL 3350 17 G PO PACK
17.0000 g | PACK | Freq: Every day | ORAL | Status: DC
  Administered 2020-10-14 – 2020-10-16 (×3): 17 g via ORAL
  Filled 2020-10-14 (×3): qty 1

## 2020-10-14 MED ORDER — POLYETHYLENE GLYCOL 3350 17 GM/SCOOP PO POWD: 17.0000 g | Freq: Every day | ORAL | 0 refills | Status: DC

## 2020-10-14 NOTE — Progress Notes (Signed)
Occupational Therapy Treatment Patient Details Name: Christian [name] MRN: [id number] DOB: 04-27-1951 Today's Date: 10/14/2020    History of present illness Patient is a 69 y.o. male CAD s/p PCI, PAF on Xarelto, DM-2, HTN, HLD, CKD stage IIIa, bipolar disorder, OSA on CPAP-presented to the ED with aphasia. MRI brain negative for acute intracranial abnormality. MRI of the brain shows diminished flow in the right vertebral artery; encephalopathy.    OT comments  Pt making gradual progress towards OT goals this session. Pt up in recliner upon arrival, pt reports " falling into chair" as pt reports BLE weakness. Focused session on functional sit<>stand training to increase strength in BLEs as precursor to higher level functional mobility. Pt completed x4 sit<>stands with RW  pt requried use of momentum and assist to shift weight anteriorly to power into standing. Pt HR increased to 121 bpm during mobility tasks. Pt very flat during session but overall WFL. Pt would continue to benefit from skilled occupational therapy while admitted and after d/c to address the below listed limitations in order to improve overall functional mobility and facilitate independence with BADL participation. DC plan remains appropriate, will follow acutely per POC.     Follow Up Recommendations  SNF;Supervision/Assistance - 24 hour    Equipment Recommendations  None recommended by OT    Recommendations for Other Services      Precautions / Restrictions Precautions Precautions: Fall Restrictions Weight Bearing Restrictions: No       Mobility Bed Mobility               General bed mobility comments: pt OOB in recliner  Transfers Overall transfer level: Needs assistance Equipment used: Rolling walker (2 wheeled) Transfers: Sit to/from Stand Sit to Stand: Mod assist         General transfer comment: pt completed x4 sit<>stands from recliner needing up to MOD A to power into standing. pt with  difficulty shifting weight anteriorly enough to power into standing, cues for hand placement progression and use of momentum to power up    Balance Overall balance assessment: Needs assistance Sitting-balance support: Feet supported;No upper extremity supported Sitting balance-Leahy Scale: Fair     Standing balance support: Bilateral upper extremity supported Standing balance-Leahy Scale: Poor Standing balance comment: reliant on BUE support and external assist                           ADL either performed or assessed with clinical judgement   ADL Overall ADL's : Needs assistance/impaired Eating/Feeding: Set up;Sitting Eating/Feeding Details (indicate cue type and reason): to open mild, water and fruit cup                     Toilet Transfer: Moderate assistance;+2 for physical assistance Toilet Transfer Details (indicate cue type and reason): MOD A +2 to stand only, pt requried use of momentum and assist to shift weight anteriorly to power into standing         Functional mobility during ADLs: Moderate assistance;Rolling walker (sit<>stand only) General ADL Comments: session focus on functional sit<>stands as precursor to higher level functional mobility from recliner with pt able to complete x4 from recliner with MOD A. pt continues to present with generalized weakness and  decreased activity tolerance     Vision       Perception     Praxis      Cognition Arousal/Alertness: Awake/alert Behavior During Therapy: Flat affect Overall Cognitive Status:  No family/caregiver present to determine baseline cognitive functioning                                 General Comments: pt very flat during session, requiring increased encouragment to participate in session.  overall WFL for simple tasks        Exercises     Shoulder Instructions       General Comments pt HR increase to 121 bpm during standing trials    Pertinent Vitals/ Pain        Pain Assessment: No/denies pain  Home Living                                          Prior Functioning/Environment              Frequency  Min 2X/week        Progress Toward Goals  OT Goals(current goals can now be found in the care plan section)  Progress towards OT goals: Progressing toward goals  Acute Rehab OT Goals Patient Stated Goal: to go to sleep OT Goal Formulation: With patient Time For Goal Achievement: 10/23/20 Potential to Achieve Goals: Good  Plan Discharge plan remains appropriate;Frequency remains appropriate    Co-evaluation                 AM-PAC OT "6 Clicks" Daily Activity     Outcome Measure   Help from another person eating meals?: A Little Help from another person taking care of personal grooming?: A Little Help from another person toileting, which includes using toliet, bedpan, or urinal?: A Lot Help from another person bathing (including washing, rinsing, drying)?: A Lot Help from another person to put on and taking off regular upper body clothing?: None Help from another person to put on and taking off regular lower body clothing?: A Little 6 Click Score: 17    End of Session Equipment Utilized During Treatment: Rolling walker;Gait belt  OT Visit Diagnosis: Unsteadiness on feet (R26.81);Muscle weakness (generalized) (M62.81);Other symptoms and signs involving cognitive function   Activity Tolerance Patient tolerated treatment well   Patient Left in chair;with call bell/phone within reach;with chair alarm set   Nurse Communication Mobility status        Time: 9470-9628 OT Time Calculation (min): 9 min  Charges: OT General Charges $OT Visit: 1 Visit OT Treatments $Therapeutic Activity: 8-22 mins  Audery Amel., COTA/L Acute Rehabilitation Services [phone]    Angelina Pih 10/14/2020, 1:47 PM

## 2020-10-14 NOTE — Progress Notes (Signed)
Physical Therapy Treatment Patient Details Name: Christian [name] MRN: [id number] DOB: [birth year] Today's Date: 10/14/2020    History of Present Illness Patient is a 69 y.o. male CAD s/p PCI, PAF on Xarelto, DM-2, HTN, HLD, CKD stage IIIa, bipolar disorder, OSA on CPAP-presented to the ED with aphasia. MRI brain negative for acute intracranial abnormality. MRI of the brain shows diminished flow in the right vertebral artery; encephalopathy.     PT Comments    Patient received in recliner, very flat today and needed encouragement from both PT and daughter to participate in session. Levels of assist needed for functional transfers varied greatly- on first attempt unable to get into standing position even with max cues/heavy modA, on second attempt able to stand with max cues/MinAx2 with daughter assisting, and on third attempt able to power up to full stand with Min guard. Did well with rocking and use of momentum. Attempted gait, and able to take a few steps forward away from the recliner but HR increased to 126BPM and he declined walking further, stating "I don't feel great". Backed up to chair and returned to sitting, from there worked on ankle pumps, long arc quads, seated clamshells, and seated marches. Left up in recliner with all needs met, chair alarm active and daughter present.    Follow Up Recommendations  SNF;Supervision/Assistance - 24 hour     Equipment Recommendations  None recommended by PT    Recommendations for Other Services       Precautions / Restrictions Precautions Precautions: Fall Restrictions Weight Bearing Restrictions: No    Mobility  Bed Mobility               General bed mobility comments: pt OOB in recliner  Transfers Overall transfer level: Needs assistance Equipment used: Rolling walker (2 wheeled) Transfers: Sit to/from Stand Sit to Stand: Mod assist;Min assist         General transfer comment: fluctuated a lot during session- on first  attempt unable to get to standing even with Max cues/heavy ModA from therapist; on second attempt needed MinAx2 along with cues for rocking/technique/hand placement, and on third attempt able to power up after rocking and using momentum with min guard  Ambulation/Gait Ambulation/Gait assistance: Min assist Gait Distance (Feet): 3 Feet Assistive device: Rolling walker (2 wheeled) Gait Pattern/deviations: Step-through pattern;Decreased stride length;Wide base of support;Trunk flexed Gait velocity: decreased   General Gait Details: took a few steps forward away from recliner, he then stated he didn't feel good and needed to sit back down- backed up to the chair and returned to sitting.   Stairs             Wheelchair Mobility    Modified Rankin (Stroke Patients Only)       Balance Overall balance assessment: Needs assistance Sitting-balance support: Feet supported;Bilateral upper extremity supported Sitting balance-Leahy Scale: Good     Standing balance support: Bilateral upper extremity supported Standing balance-Leahy Scale: Poor Standing balance comment: reliant on BUE support and external assist                            Cognition Arousal/Alertness: Awake/alert Behavior During Therapy: Flat affect Overall Cognitive Status: Impaired/Different from baseline                     Current Attention Level: Sustained Memory: Decreased short-term memory   Safety/Judgement: Decreased awareness of safety;Decreased awareness of deficits Awareness: Intellectual Problem Solving: Slow  processing;Decreased initiation;Difficulty sequencing;Requires verbal cues;Requires tactile cues General Comments: very flat during session, needed lots of encouragement from PT and daughter to participate in session but cooperative      Exercises      General Comments General comments (skin integrity, edema, etc.): HR increase to as much as 123-126BPM in standing and  taking steps forward/backward      Pertinent Vitals/Pain Pain Assessment: No/denies pain    Home Living                      Prior Function            PT Goals (current goals can now be found in the care plan section) Acute Rehab PT Goals Patient Stated Goal: to go to sleep PT Goal Formulation: With patient Time For Goal Achievement: 10/22/20 Potential to Achieve Goals: Good Progress towards PT goals: Progressing toward goals (slowly)    Frequency    Min 3X/week      PT Plan Current plan remains appropriate    Co-evaluation              AM-PAC PT "6 Clicks" Mobility   Outcome Measure  Help needed turning from your back to your side while in a flat bed without using bedrails?: A Little Help needed moving from lying on your back to sitting on the side of a flat bed without using bedrails?: A Little Help needed moving to and from a bed to a chair (including a wheelchair)?: A Lot Help needed standing up from a chair using your arms (e.g., wheelchair or bedside chair)?: A Lot Help needed to walk in hospital room?: A Lot Help needed climbing 3-5 steps with a railing? : A Lot 6 Click Score: 14    End of Session Equipment Utilized During Treatment: Gait belt Activity Tolerance: Patient tolerated treatment well Patient left: in chair;with call bell/phone within reach;with chair alarm set;with family/visitor present Nurse Communication: Mobility status PT Visit Diagnosis: Unsteadiness on feet (R26.81);Difficulty in walking, not elsewhere classified (R26.2);Muscle weakness (generalized) (M62.81);Other symptoms and signs involving the nervous system (R29.898)     Time: 9774-1423 PT Time Calculation (min) (ACUTE ONLY): 23 min  Charges:  $Therapeutic Exercise: 8-22 mins $Therapeutic Activity: 8-22 mins                     Windell Norfolk, DPT, PN1   Supplemental Physical Therapist [facility]    Pager [phone] Acute Rehab Office [phone]

## 2020-10-14 NOTE — Progress Notes (Signed)
PROGRESS NOTE        PATIENT DETAILS Name: Christian [name] Age: 69 y.o. Sex: male Date of Birth: 06-13-1951 Admit Date: 10/07/2020 Admitting Physician Emeline General, MD PCP:Sun, Charise Carwin, MD  Brief Narrative: Patient is a 69 y.o. male CAD s/p PCI, PAF on Xarelto, DM-2, HTN, HLD, CKD stage IIIa, bipolar disorder, OSA on CPAP-presented to the ED after he had difficulty formulating sentences.  He was subsequently admitted for further evaluation and treatment.  Significant events: 11/9>> admit with aphasia  Significant studies: 11/9>> chest x-ray: No pneumonia. 11/9>> CT head: No acute intracranial process 11/9>> MRI brain: No acute intracranial abnormality. 11/9>> MRI brain: Chronically advanced intracranial atherosclerosis. 11/10>> Echo: EF 65-70%, grade 1 diastolic dysfunction. 11/10>> carotid Doppler: No significant stenosis 11/10>> LDL: 81 11/10>> A1c 11.1 11/11>> CT head: No evidence of acute intracranial abnormality.  Antimicrobial therapy: None  Microbiology data: None  Procedures : 11/10>> EEG:No seizures  Consults: Neurology  DVT Prophylaxis : rivaroxaban (XARELTO) tablet 20 mg    Subjective: Lying comfortably in bed-speech clear-following commands-moving all 4 extremities.  Assessment/Plan: Aphasia:Speech has significantly improved overnight-MRI brain on 11/9 - for CVA-CT head repeated on 11/11 morning-which does not show any acute abnormalities. Suspect in retrospect-that this probably was more from encephalopathy from AKI. EEG was negative for seizures-no further recommendations from neurology.  Acute metabolic encephalopathy:Likely secondary to AKI-no indication of infection-UA/chest x-ray negative. Afebrile. Significantly better-suspect mental status is now close to baseline.  AKI on CKD stage IIIa: AKI likely hemodynamically mediated-improved with supportive care-creatinine has plateaued around 1.92-1.98 range. Continue  to avoid nephrotoxic agents-repeat electrolytes periodically.  Follow-up with primary care practitioner-consider outpatient referral to nephrology.  HTN: BP stable-currently off all antihypertensive medications.  Hypothyroidism: Continue Cytomel.  TSH within normal limits.  CAD s/p PCI to LAD on 04/29/2020: Reviewed outpatient cardiology note-continue Plavix.  PAF: Currently in sinus rhythm-continue Xarelto and metoprolol.  Note-appears to be on both Plavix and Xarelto-being followed by cardiology in the outpatient setting-defer further adjustments to his primary cardiologist at [facility].  Bipolar disorder: Continue Seroquel,Wellbutrin and Depakote.  OSA: CPAP nightly.  Obesity: Estimated body mass index is 38.11 kg/m as calculated from the following:   Height as of this encounter: 6' (1.829 m).   Weight as of this encounter: 127.5 kg.    Diet: Diet Order            Diet - low sodium heart healthy           Diet Carb Modified           Diet heart healthy/carb modified Room service appropriate? Yes; Fluid consistency: Thin  Diet effective now                  Code Status: Full code   Family Communication: Spoke with Daughter [name] [phone]) over phone 11/13  Disposition Plan: Status is: Inpatient  Remains inpatient appropriate because:Inpatient level of care appropriate due to severity of illness   Dispo: The patient is from: Home              Anticipated d/c is to: SNF              Anticipated d/c date is: 1 day              Patient currently is  medically stable to d/c.  Barriers to Discharge: Awaiting SNF bed.  Antimicrobial agents: Anti-infectives (From admission, onward)   None      Time spent: 15 minutes-Greater than 50% of this time was spent in counseling, explanation of diagnosis, planning of further management, and coordination of care.  MEDICATIONS: Scheduled Meds: . ARIPiprazole  20 mg Oral Daily  . atorvastatin  80 mg Oral Daily  .  buPROPion  75 mg Oral Daily  . clopidogrel  75 mg Oral Daily  . divalproex  500 mg Oral Q12H  . insulin aspart  0-15 Units Subcutaneous TID WC  . insulin aspart  0-5 Units Subcutaneous QHS  . liothyronine  25 mcg Oral QAC breakfast  . mirtazapine  15 mg Oral QHS  . pantoprazole  40 mg Oral Q1200  . polyethylene glycol  17 g Oral Daily  . QUEtiapine  25 mg Oral BID  . QUEtiapine  300 mg Oral QHS  . rivaroxaban  20 mg Oral Q supper   Continuous Infusions:  PRN Meds:.acetaminophen **OR** acetaminophen (TYLENOL) oral liquid 160 mg/5 mL **OR** acetaminophen, senna-docusate   PHYSICAL EXAM: Vital signs: Vitals:   10/13/20 2102 10/13/20 2337 10/14/20 0337 10/14/20 0735  BP:  135/77 115/69 111/82  Pulse: 86 89 82 87  Resp: 20 18 18 20   Temp:  98.3 F (36.8 C) 98.3 F (36.8 C) 98.4 F (36.9 C)  TempSrc:  Oral Oral Oral  SpO2: 97% 98% 98% 97%  Weight:      Height:       Filed Weights   10/07/20 1457  Weight: 127.5 kg   Body mass index is 38.11 kg/m.   Gen Exam:Alert awake-not in any distress HEENT:atraumatic, normocephalic Chest: B/L clear to auscultation anteriorly CVS:S1S2 regular Abdomen:soft non tender, non distended Extremities:no edema Neurology: Non focal Skin: no rash  I have personally reviewed following labs and imaging studies  LABORATORY DATA: CBC: Recent Labs  Lab 10/07/20 1534 10/13/20 0427  WBC 5.9 5.7  NEUTROABS 4.6  --   HGB 12.3* 12.0*  HCT 42.6 40.6  MCV 78.0* 76.5*  PLT 217 196    Basic Metabolic Panel: Recent Labs  Lab 10/07/20 1534 10/08/20 0353 10/09/20 0105 10/13/20 0427  NA 135 134* 137 129*  K 5.2* 4.6 4.7 4.5  CL 99 102 105 97*  CO2 26 24 22 24   GLUCOSE 228* 150* 113* 125*  BUN 63* 54* 48* 49*  CREATININE 2.31* 1.92* 1.98* 2.13*  CALCIUM 10.0 9.4 9.2 8.8*    GFR: Estimated Creatinine Clearance: 46.5 mL/min (A) (by C-G formula based on SCr of 2.13 mg/dL (H)).  Liver Function Tests: Recent Labs  Lab 10/07/20 1534    AST 14*  ALT 13  ALKPHOS 52  BILITOT 0.7  PROT 7.5  ALBUMIN 3.5   No results for input(s): LIPASE, AMYLASE in the last 168 hours. Recent Labs  Lab 10/08/20 1621  AMMONIA 37*    Coagulation Profile: Recent Labs  Lab 10/07/20 1534  INR 1.9*    Cardiac Enzymes: Recent Labs  Lab 10/07/20 1728  CKTOTAL 98    BNP (last 3 results) No results for input(s): PROBNP in the last 8760 hours.  Lipid Profile: No results for input(s): CHOL, HDL, LDLCALC, TRIG, CHOLHDL, LDLDIRECT in the last 72 hours.  Thyroid Function Tests: No results for input(s): TSH, T4TOTAL, FREET4, T3FREE, THYROIDAB in the last 72 hours.  Anemia Panel: No results for input(s): VITAMINB12, FOLATE, FERRITIN, TIBC, IRON, RETICCTPCT in the last 72 hours.  Urine analysis:  Component Value Date/Time   COLORURINE STRAW (A) 10/07/2020 1728   APPEARANCEUR CLEAR 10/07/2020 1728   APPEARANCEUR Cloudy 08/01/2014 1614   LABSPEC 1.010 10/07/2020 1728   LABSPEC 1.008 08/01/2014 1614   PHURINE 5.0 10/07/2020 1728   GLUCOSEU >=500 (A) 10/07/2020 1728   GLUCOSEU Negative 08/01/2014 1614   HGBUR NEGATIVE 10/07/2020 1728   BILIRUBINUR NEGATIVE 10/07/2020 1728   BILIRUBINUR Negative 08/01/2014 1614   KETONESUR NEGATIVE 10/07/2020 1728   PROTEINUR NEGATIVE 10/07/2020 1728   UROBILINOGEN 0.2 07/01/2014 1809   NITRITE NEGATIVE 10/07/2020 1728   LEUKOCYTESUR NEGATIVE 10/07/2020 1728   LEUKOCYTESUR Negative 08/01/2014 1614    Sepsis Labs: Lactic Acid, Venous    Component Value Date/Time   LATICACIDVEN 0.7 06/17/2014 2338    MICROBIOLOGY: Recent Results (from the past 240 hour(s))  Respiratory Panel by RT PCR (Flu A&B, Covid) - Nasopharyngeal Swab     Status: None   Collection Time: 10/07/20  3:38 PM   Specimen: Nasopharyngeal Swab  Result Value Ref Range Status   SARS Coronavirus 2 by RT PCR NEGATIVE NEGATIVE Final    Comment: (NOTE) SARS-CoV-2 target nucleic acids are NOT DETECTED.  The SARS-CoV-2 RNA  is generally detectable in upper respiratoy specimens during the acute phase of infection. The lowest concentration of SARS-CoV-2 viral copies this assay can detect is 131 copies/mL. A negative result does not preclude SARS-Cov-2 infection and should not be used as the sole basis for treatment or other patient management decisions. A negative result may occur with  improper specimen collection/handling, submission of specimen other than nasopharyngeal swab, presence of viral mutation(s) within the areas targeted by this assay, and inadequate number of viral copies (<131 copies/mL). A negative result must be combined with clinical observations, patient history, and epidemiological information. The expected result is Negative.  Fact Sheet for Patients:  https://www.moore.com/  Fact Sheet for Healthcare Providers:  https://www.young.biz/  This test is no t yet approved or cleared by the [place] FDA and  has been authorized for detection and/or diagnosis of SARS-CoV-2 by FDA under an Emergency Use Authorization (EUA). This EUA will remain  in effect (meaning this test can be used) for the duration of the COVID-19 declaration under Section 564(b)(1) of the Act, 21 U.S.C. section 360bbb-3(b)(1), unless the authorization is terminated or revoked sooner.     Influenza A by PCR NEGATIVE NEGATIVE Final   Influenza B by PCR NEGATIVE NEGATIVE Final    Comment: (NOTE) The Xpert Xpress SARS-CoV-2/FLU/RSV assay is intended as an aid in  the diagnosis of influenza from Nasopharyngeal swab specimens and  should not be used as a sole basis for treatment. Nasal washings and  aspirates are unacceptable for Xpert Xpress SARS-CoV-2/FLU/RSV  testing.  Fact Sheet for Patients: https://www.moore.com/  Fact Sheet for Healthcare Providers: https://www.young.biz/  This test is not yet approved or cleared by the [place] FDA and  has been authorized for detection and/or diagnosis of SARS-CoV-2 by  FDA under an Emergency Use Authorization (EUA). This EUA will remain  in effect (meaning this test can be used) for the duration of the  Covid-19 declaration under Section 564(b)(1) of the Act, 21  U.S.C. section 360bbb-3(b)(1), unless the authorization is  terminated or revoked. Performed at [facility], [address] [place], [place] [address]   Respiratory Panel by RT PCR (Flu A&B, Covid) - Nasopharyngeal Swab     Status: None   Collection Time: 10/10/20 12:36 PM   Specimen: Nasopharyngeal Swab  Result  Value Ref Range Status   SARS Coronavirus 2 by RT PCR NEGATIVE NEGATIVE Final    Comment: (NOTE) SARS-CoV-2 target nucleic acids are NOT DETECTED.  The SARS-CoV-2 RNA is generally detectable in upper respiratoy specimens during the acute phase of infection. The lowest concentration of SARS-CoV-2 viral copies this assay can detect is 131 copies/mL. A negative result does not preclude SARS-Cov-2 infection and should not be used as the sole basis for treatment or other patient management decisions. A negative result may occur with  improper specimen collection/handling, submission of specimen other than nasopharyngeal swab, presence of viral mutation(s) within the areas targeted by this assay, and inadequate number of viral copies (<131 copies/mL). A negative result must be combined with clinical observations, patient history, and epidemiological information. The expected result is Negative.  Fact Sheet for Patients:  https://www.moore.com/  Fact Sheet for Healthcare Providers:  https://www.young.biz/  This test is no t yet approved or cleared by the [place] FDA and  has been authorized for detection and/or diagnosis of SARS-CoV-2 by FDA under an Emergency Use Authorization (EUA). This EUA will remain  in effect (meaning this test can be used)  for the duration of the COVID-19 declaration under Section 564(b)(1) of the Act, 21 U.S.C. section 360bbb-3(b)(1), unless the authorization is terminated or revoked sooner.     Influenza A by PCR NEGATIVE NEGATIVE Final   Influenza B by PCR NEGATIVE NEGATIVE Final    Comment: (NOTE) The Xpert Xpress SARS-CoV-2/FLU/RSV assay is intended as an aid in  the diagnosis of influenza from Nasopharyngeal swab specimens and  should not be used as a sole basis for treatment. Nasal washings and  aspirates are unacceptable for Xpert Xpress SARS-CoV-2/FLU/RSV  testing.  Fact Sheet for Patients: https://www.moore.com/  Fact Sheet for Healthcare Providers: https://www.young.biz/  This test is not yet approved or cleared by the [place] FDA and  has been authorized for detection and/or diagnosis of SARS-CoV-2 by  FDA under an Emergency Use Authorization (EUA). This EUA will remain  in effect (meaning this test can be used) for the duration of the  Covid-19 declaration under Section 564(b)(1) of the Act, 21  U.S.C. section 360bbb-3(b)(1), unless the authorization is  terminated or revoked. Performed at [facility], [address] [place], [place] [address]   Respiratory Panel by RT PCR (Flu A&B, Covid) - Nasopharyngeal Swab     Status: None   Collection Time: 10/13/20  1:34 PM   Specimen: Nasopharyngeal Swab  Result Value Ref Range Status   SARS Coronavirus 2 by RT PCR NEGATIVE NEGATIVE Final    Comment: (NOTE) SARS-CoV-2 target nucleic acids are NOT DETECTED.  The SARS-CoV-2 RNA is generally detectable in upper respiratoy specimens during the acute phase of infection. The lowest concentration of SARS-CoV-2 viral copies this assay can detect is 131 copies/mL. A negative result does not preclude SARS-Cov-2 infection and should not be used as the sole basis for treatment or other patient management decisions. A negative result may occur with    improper specimen collection/handling, submission of specimen other than nasopharyngeal swab, presence of viral mutation(s) within the areas targeted by this assay, and inadequate number of viral copies (<131 copies/mL). A negative result must be combined with clinical observations, patient history, and epidemiological information. The expected result is Negative.  Fact Sheet for Patients:  https://www.moore.com/  Fact Sheet for Healthcare Providers:  https://www.young.biz/  This test is no t yet approved or cleared by the [place] and  has been authorized for detection and/or diagnosis of SARS-CoV-2 by FDA under an Emergency Use Authorization (EUA). This EUA will remain  in effect (meaning this test can be used) for the duration of the COVID-19 declaration under Section 564(b)(1) of the Act, 21 U.S.C. section 360bbb-3(b)(1), unless the authorization is terminated or revoked sooner.     Influenza A by PCR NEGATIVE NEGATIVE Final   Influenza B by PCR NEGATIVE NEGATIVE Final    Comment: (NOTE) The Xpert Xpress SARS-CoV-2/FLU/RSV assay is intended as an aid in  the diagnosis of influenza from Nasopharyngeal swab specimens and  should not be used as a sole basis for treatment. Nasal washings and  aspirates are unacceptable for Xpert Xpress SARS-CoV-2/FLU/RSV  testing.  Fact Sheet for Patients: https://www.moore.com/  Fact Sheet for Healthcare Providers: https://www.young.biz/  This test is not yet approved or cleared by the [place] FDA and  has been authorized for detection and/or diagnosis of SARS-CoV-2 by  FDA under an Emergency Use Authorization (EUA). This EUA will remain  in effect (meaning this test can be used) for the duration of the  Covid-19 declaration under Section 564(b)(1) of the Act, 21  U.S.C. section 360bbb-3(b)(1), unless the authorization is  terminated or  revoked. Performed at [facility], [address], [place], [place] [address]     RADIOLOGY STUDIES/RESULTS: No results found.   LOS: 7 days   Jeoffrey Massed, MD  [facility]    To contact the attending provider between 7A-7P or the covering provider during after hours 7P-7A, please log into the web site [email or web address] and access using universal [place] password for that web site. If you do not have the password, please call the hospital operator.  10/14/2020, 9:36 AM

## 2020-10-14 NOTE — TOC Progression Note (Addendum)
Transition of Care [facility]) - Progression Note    Patient Details  Name: Christian [name] MRN: [id number] Date of Birth: [birth year]  Transition [facility] CM/SW Contact  Baldemar Lenis, [place] Phone Number: 10/14/2020, 9:32 AM  Clinical Narrative:   CSW received denials from both [facility] and [facility]. CSW has faxed out referral to surrounding areas to find bed offer for patient. No bed offers at this time.  ADDENDUM 4:35 PM: CSW spoke with daughter, Christian [name], about looking outside of [facility]. [name] asked about [facility], as she initially had said that they would not be an option. CSW reached back out to [facility] to review, waiting on response.     Expected Discharge Plan: Skilled Nursing Facility Barriers to Discharge: Other (comment) No bed offer  Expected Discharge Plan and Services Expected Discharge Plan: Skilled Nursing Facility     Post Acute Care Choice: Skilled Nursing Facility Living arrangements for the past 2 months: Apartment Expected Discharge Date: 10/10/20                                     Social Determinants of Health (SDOH) Interventions    Readmission Risk Interventions No flowsheet data found.

## 2020-10-15 LAB — GLUCOSE, CAPILLARY
Glucose-Capillary: 134 mg/dL — ABNORMAL HIGH (ref 70–99)
Glucose-Capillary: 135 mg/dL — ABNORMAL HIGH (ref 70–99)
Glucose-Capillary: 171 mg/dL — ABNORMAL HIGH (ref 70–99)
Glucose-Capillary: 189 mg/dL — ABNORMAL HIGH (ref 70–99)
Glucose-Capillary: 243 mg/dL — ABNORMAL HIGH (ref 70–99)

## 2020-10-15 NOTE — Progress Notes (Signed)
PROGRESS NOTE        PATIENT DETAILS Name: Christian [name] Age: 69 y.o. Sex: male Date of Birth: 05-Nov-1951 Admit Date: 10/07/2020 Admitting Physician Emeline General, MD PCP:Sun, Charise Carwin, MD  Brief Narrative: Patient is a 69 y.o. male CAD s/p PCI, PAF on Xarelto, DM-2, HTN, HLD, CKD stage IIIa, bipolar disorder, OSA on CPAP-presented to the ED after he had difficulty formulating sentences.  He was subsequently admitted for further evaluation and treatment.  Significant events: 11/9>> admit with aphasia  Significant studies: 11/9>> chest x-ray: No pneumonia. 11/9>> CT head: No acute intracranial process 11/9>> MRI brain: No acute intracranial abnormality. 11/9>> MRI brain: Chronically advanced intracranial atherosclerosis. 11/10>> Echo: EF 65-70%, grade 1 diastolic dysfunction. 11/10>> carotid Doppler: No significant stenosis 11/10>> LDL: 81 11/10>> A1c 11.1 11/11>> CT head: No evidence of acute intracranial abnormality.  Antimicrobial therapy: None  Microbiology data: None  Procedures : 11/10>> EEG:No seizures  Consults: Neurology  DVT Prophylaxis : rivaroxaban (XARELTO) tablet 20 mg    Subjective: Lying comfortably in bed-speech clear-following commands-moving all 4 extremities.  Awaiting SNF bed-remains medically stable for transfer to SNF.  Assessment/Plan: Aphasia:Speech has significantly improved overnight-MRI brain on 11/9 - for CVA-CT head repeated on 11/11 morning-which does not show any acute abnormalities. Suspect in retrospect-that this probably was more from encephalopathy from AKI. EEG was negative for seizures-no further recommendations from neurology.  Acute metabolic encephalopathy:Likely secondary to AKI-no indication of infection-UA/chest x-ray negative. Afebrile. Significantly better-suspect mental status is now close to baseline.  AKI on CKD stage IIIa: AKI likely hemodynamically mediated-improved with supportive  care-creatinine has plateaued around 1.92-1.98 range. Continue to avoid nephrotoxic agents-repeat electrolytes periodically.  Follow-up with primary care practitioner-consider outpatient referral to nephrology.  HTN: BP stable-currently off all antihypertensive medications.  Hypothyroidism: Continue Cytomel.  TSH within normal limits.  CAD s/p PCI to LAD on 04/29/2020: Reviewed outpatient cardiology note-continue Plavix.  PAF: Currently in sinus rhythm-continue Xarelto and metoprolol.  Note-appears to be on both Plavix and Xarelto-being followed by cardiology in the outpatient setting-defer further adjustments to his primary cardiologist at [facility].  Bipolar disorder: Continue Seroquel,Wellbutrin and Depakote.  OSA: CPAP nightly.  Obesity: Estimated body mass index is 38.11 kg/m as calculated from the following:   Height as of this encounter: 6' (1.829 m).   Weight as of this encounter: 127.5 kg.    Diet: Diet Order            Diet - low sodium heart healthy           Diet Carb Modified           Diet heart healthy/carb modified Room service appropriate? Yes; Fluid consistency: Thin  Diet effective now                  Code Status: Full code   Family Communication: Spoke with Daughter [name] [phone]) over phone 11/13  Disposition Plan: Status is: Inpatient  Remains inpatient appropriate because:Inpatient level of care appropriate due to severity of illness   Dispo: The patient is from: Home              Anticipated d/c is to: SNF              Anticipated d/c date is: 1 day  Patient currently is  medically stable to d/c.     Barriers to Discharge: Awaiting SNF bed.  Antimicrobial agents: Anti-infectives (From admission, onward)   None      Time spent: 15 minutes-Greater than 50% of this time was spent in counseling, explanation of diagnosis, planning of further management, and coordination of care.  MEDICATIONS: Scheduled Meds: .  ARIPiprazole  20 mg Oral Daily  . atorvastatin  80 mg Oral Daily  . buPROPion  75 mg Oral Daily  . clopidogrel  75 mg Oral Daily  . divalproex  500 mg Oral Q12H  . insulin aspart  0-15 Units Subcutaneous TID WC  . insulin aspart  0-5 Units Subcutaneous QHS  . liothyronine  25 mcg Oral QAC breakfast  . mirtazapine  15 mg Oral QHS  . pantoprazole  40 mg Oral Q1200  . polyethylene glycol  17 g Oral Daily  . QUEtiapine  25 mg Oral BID  . QUEtiapine  300 mg Oral QHS  . rivaroxaban  20 mg Oral Q supper   Continuous Infusions:  PRN Meds:.acetaminophen **OR** acetaminophen (TYLENOL) oral liquid 160 mg/5 mL **OR** acetaminophen, senna-docusate   PHYSICAL EXAM: Vital signs: Vitals:   10/15/20 1207 10/15/20 1227 10/15/20 1323 10/15/20 1324  BP: 110/79     Pulse: (!) 115 (!) 112    Resp: (!) 24 15 19 17   Temp: 98 F (36.7 C)     TempSrc: Oral     SpO2: 93%     Weight:      Height:       Filed Weights   10/07/20 1457  Weight: 127.5 kg   Body mass index is 38.11 kg/m.   Gen Exam:Alert awake-not in any distress HEENT:atraumatic, normocephalic Chest: B/L clear to auscultation anteriorly CVS:S1S2 regular Abdomen:soft non tender, non distended Extremities:no edema Neurology: Non focal Skin: no rash  I have personally reviewed following labs and imaging studies  LABORATORY DATA: CBC: Recent Labs  Lab 10/13/20 0427  WBC 5.7  HGB 12.0*  HCT 40.6  MCV 76.5*  PLT 196    Basic Metabolic Panel: Recent Labs  Lab 10/09/20 0105 10/13/20 0427  NA 137 129*  K 4.7 4.5  CL 105 97*  CO2 22 24  GLUCOSE 113* 125*  BUN 48* 49*  CREATININE 1.98* 2.13*  CALCIUM 9.2 8.8*    GFR: Estimated Creatinine Clearance: 46.5 mL/min (A) (by C-G formula based on SCr of 2.13 mg/dL (H)).  Liver Function Tests: No results for input(s): AST, ALT, ALKPHOS, BILITOT, PROT, ALBUMIN in the last 168 hours. No results for input(s): LIPASE, AMYLASE in the last 168 hours. Recent Labs  Lab  10/08/20 1621  AMMONIA 37*    Coagulation Profile: No results for input(s): INR, PROTIME in the last 168 hours.  Cardiac Enzymes: No results for input(s): CKTOTAL, CKMB, CKMBINDEX, TROPONINI in the last 168 hours.  BNP (last 3 results) No results for input(s): PROBNP in the last 8760 hours.  Lipid Profile: No results for input(s): CHOL, HDL, LDLCALC, TRIG, CHOLHDL, LDLDIRECT in the last 72 hours.  Thyroid Function Tests: No results for input(s): TSH, T4TOTAL, FREET4, T3FREE, THYROIDAB in the last 72 hours.  Anemia Panel: No results for input(s): VITAMINB12, FOLATE, FERRITIN, TIBC, IRON, RETICCTPCT in the last 72 hours.  Urine analysis:    Component Value Date/Time   COLORURINE STRAW (A) 10/07/2020 1728   APPEARANCEUR CLEAR 10/07/2020 1728   APPEARANCEUR Cloudy 08/01/2014 1614   LABSPEC 1.010 10/07/2020 1728   LABSPEC 1.008 08/01/2014  1614   PHURINE 5.0 10/07/2020 1728   GLUCOSEU >=500 (A) 10/07/2020 1728   GLUCOSEU Negative 08/01/2014 1614   HGBUR NEGATIVE 10/07/2020 1728   BILIRUBINUR NEGATIVE 10/07/2020 1728   BILIRUBINUR Negative 08/01/2014 1614   KETONESUR NEGATIVE 10/07/2020 1728   PROTEINUR NEGATIVE 10/07/2020 1728   UROBILINOGEN 0.2 07/01/2014 1809   NITRITE NEGATIVE 10/07/2020 1728   LEUKOCYTESUR NEGATIVE 10/07/2020 1728   LEUKOCYTESUR Negative 08/01/2014 1614    Sepsis Labs: Lactic Acid, Venous    Component Value Date/Time   LATICACIDVEN 0.7 06/17/2014 2338    MICROBIOLOGY: Recent Results (from the past 240 hour(s))  Respiratory Panel by RT PCR (Flu A&B, Covid) - Nasopharyngeal Swab     Status: None   Collection Time: 10/07/20  3:38 PM   Specimen: Nasopharyngeal Swab  Result Value Ref Range Status   SARS Coronavirus 2 by RT PCR NEGATIVE NEGATIVE Final    Comment: (NOTE) SARS-CoV-2 target nucleic acids are NOT DETECTED.  The SARS-CoV-2 RNA is generally detectable in upper respiratoy specimens during the acute phase of infection. The  lowest concentration of SARS-CoV-2 viral copies this assay can detect is 131 copies/mL. A negative result does not preclude SARS-Cov-2 infection and should not be used as the sole basis for treatment or other patient management decisions. A negative result may occur with  improper specimen collection/handling, submission of specimen other than nasopharyngeal swab, presence of viral mutation(s) within the areas targeted by this assay, and inadequate number of viral copies (<131 copies/mL). A negative result must be combined with clinical observations, patient history, and epidemiological information. The expected result is Negative.  Fact Sheet for Patients:  https://www.moore.com/  Fact Sheet for Healthcare Providers:  https://www.young.biz/  This test is no t yet approved or cleared by the [place] FDA and  has been authorized for detection and/or diagnosis of SARS-CoV-2 by FDA under an Emergency Use Authorization (EUA). This EUA will remain  in effect (meaning this test can be used) for the duration of the COVID-19 declaration under Section 564(b)(1) of the Act, 21 U.S.C. section 360bbb-3(b)(1), unless the authorization is terminated or revoked sooner.     Influenza A by PCR NEGATIVE NEGATIVE Final   Influenza B by PCR NEGATIVE NEGATIVE Final    Comment: (NOTE) The Xpert Xpress SARS-CoV-2/FLU/RSV assay is intended as an aid in  the diagnosis of influenza from Nasopharyngeal swab specimens and  should not be used as a sole basis for treatment. Nasal washings and  aspirates are unacceptable for Xpert Xpress SARS-CoV-2/FLU/RSV  testing.  Fact Sheet for Patients: https://www.moore.com/  Fact Sheet for Healthcare Providers: https://www.young.biz/  This test is not yet approved or cleared by the [place] FDA and  has been authorized for detection and/or diagnosis of SARS-CoV-2 by  FDA under  an Emergency Use Authorization (EUA). This EUA will remain  in effect (meaning this test can be used) for the duration of the  Covid-19 declaration under Section 564(b)(1) of the Act, 21  U.S.C. section 360bbb-3(b)(1), unless the authorization is  terminated or revoked. Performed at [facility], [address], [place], [place] [address]   Respiratory Panel by RT PCR (Flu A&B, Covid) - Nasopharyngeal Swab     Status: None   Collection Time: 10/10/20 12:36 PM   Specimen: Nasopharyngeal Swab  Result Value Ref Range Status   SARS Coronavirus 2 by RT PCR NEGATIVE NEGATIVE Final    Comment: (NOTE) SARS-CoV-2 target nucleic acids are NOT DETECTED.  The SARS-CoV-2 RNA is generally  detectable in upper respiratoy specimens during the acute phase of infection. The lowest concentration of SARS-CoV-2 viral copies this assay can detect is 131 copies/mL. A negative result does not preclude SARS-Cov-2 infection and should not be used as the sole basis for treatment or other patient management decisions. A negative result may occur with  improper specimen collection/handling, submission of specimen other than nasopharyngeal swab, presence of viral mutation(s) within the areas targeted by this assay, and inadequate number of viral copies (<131 copies/mL). A negative result must be combined with clinical observations, patient history, and epidemiological information. The expected result is Negative.  Fact Sheet for Patients:  https://www.moore.com/  Fact Sheet for Healthcare Providers:  https://www.young.biz/  This test is no t yet approved or cleared by the [place] FDA and  has been authorized for detection and/or diagnosis of SARS-CoV-2 by FDA under an Emergency Use Authorization (EUA). This EUA will remain  in effect (meaning this test can be used) for the duration of the COVID-19 declaration under Section 564(b)(1) of the Act, 21  U.S.C. section 360bbb-3(b)(1), unless the authorization is terminated or revoked sooner.     Influenza A by PCR NEGATIVE NEGATIVE Final   Influenza B by PCR NEGATIVE NEGATIVE Final    Comment: (NOTE) The Xpert Xpress SARS-CoV-2/FLU/RSV assay is intended as an aid in  the diagnosis of influenza from Nasopharyngeal swab specimens and  should not be used as a sole basis for treatment. Nasal washings and  aspirates are unacceptable for Xpert Xpress SARS-CoV-2/FLU/RSV  testing.  Fact Sheet for Patients: https://www.moore.com/  Fact Sheet for Healthcare Providers: https://www.young.biz/  This test is not yet approved or cleared by the [place] FDA and  has been authorized for detection and/or diagnosis of SARS-CoV-2 by  FDA under an Emergency Use Authorization (EUA). This EUA will remain  in effect (meaning this test can be used) for the duration of the  Covid-19 declaration under Section 564(b)(1) of the Act, 21  U.S.C. section 360bbb-3(b)(1), unless the authorization is  terminated or revoked. Performed at [facility], [address] [place], [place] [address]   Respiratory Panel by RT PCR (Flu A&B, Covid) - Nasopharyngeal Swab     Status: None   Collection Time: 10/13/20  1:34 PM   Specimen: Nasopharyngeal Swab  Result Value Ref Range Status   SARS Coronavirus 2 by RT PCR NEGATIVE NEGATIVE Final    Comment: (NOTE) SARS-CoV-2 target nucleic acids are NOT DETECTED.  The SARS-CoV-2 RNA is generally detectable in upper respiratoy specimens during the acute phase of infection. The lowest concentration of SARS-CoV-2 viral copies this assay can detect is 131 copies/mL. A negative result does not preclude SARS-Cov-2 infection and should not be used as the sole basis for treatment or other patient management decisions. A negative result may occur with  improper specimen collection/handling, submission of specimen other than  nasopharyngeal swab, presence of viral mutation(s) within the areas targeted by this assay, and inadequate number of viral copies (<131 copies/mL). A negative result must be combined with clinical observations, patient history, and epidemiological information. The expected result is Negative.  Fact Sheet for Patients:  https://www.moore.com/  Fact Sheet for Healthcare Providers:  https://www.young.biz/  This test is no t yet approved or cleared by the [place] FDA and  has been authorized for detection and/or diagnosis of SARS-CoV-2 by FDA under an Emergency Use Authorization (EUA). This EUA will remain  in effect (meaning this test can be used) for the duration of  the COVID-19 declaration under Section 564(b)(1) of the Act, 21 U.S.C. section 360bbb-3(b)(1), unless the authorization is terminated or revoked sooner.     Influenza A by PCR NEGATIVE NEGATIVE Final   Influenza B by PCR NEGATIVE NEGATIVE Final    Comment: (NOTE) The Xpert Xpress SARS-CoV-2/FLU/RSV assay is intended as an aid in  the diagnosis of influenza from Nasopharyngeal swab specimens and  should not be used as a sole basis for treatment. Nasal washings and  aspirates are unacceptable for Xpert Xpress SARS-CoV-2/FLU/RSV  testing.  Fact Sheet for Patients: https://www.moore.com/  Fact Sheet for Healthcare Providers: https://www.young.biz/  This test is not yet approved or cleared by the [place] FDA and  has been authorized for detection and/or diagnosis of SARS-CoV-2 by  FDA under an Emergency Use Authorization (EUA). This EUA will remain  in effect (meaning this test can be used) for the duration of the  Covid-19 declaration under Section 564(b)(1) of the Act, 21  U.S.C. section 360bbb-3(b)(1), unless the authorization is  terminated or revoked. Performed at [facility], [address], [place],  [place] [address]     RADIOLOGY STUDIES/RESULTS: No results found.   LOS: 8 days   Jeoffrey Massed, MD  [facility]    To contact the attending provider between 7A-7P or the covering provider during after hours 7P-7A, please log into the web site [email or web address] and access using universal [place] password for that web site. If you do not have the password, please call the hospital operator.  10/15/2020, 2:58 PM

## 2020-10-15 NOTE — TOC Progression Note (Signed)
Transition of Care [facility]) - Progression Note    Patient Details  Name: DREQUAN IRONSIDE MRN: [id number] Date of Birth: January 27, 1951  Transition of Care [facility]) CM/SW Contact  Baldemar Lenis, [place] Phone Number: 10/15/2020, 3:52 PM  Clinical Narrative:   CSW received update that [facility] is also unable to offer on the patient. CSW spoke with daughter to update and provide other bed offers, and daughter indicated preference for either [facility], [address], or [facility]. CSW reached out to [place] and they are able to accept. CSW contacted [employer] to update insurance authorization, and [employer] [employer] needs updates to provide new authorization. CSW faxed insurance updates and will follow.    Expected Discharge Plan: Skilled Nursing Facility Barriers to Discharge: Insurance Authorization  Expected Discharge Plan and Services Expected Discharge Plan: Skilled Nursing Facility     Post Acute Care Choice: Skilled Nursing Facility Living arrangements for the past 2 months: Apartment Expected Discharge Date: 10/10/20                                     Social Determinants of Health (SDOH) Interventions    Readmission Risk Interventions No flowsheet data found.

## 2020-10-15 NOTE — Progress Notes (Signed)
Physical Therapy Treatment Patient Details Name: Christian [name] MRN: [id number] DOB: [birth year] Today's Date: 10/15/2020    History of Present Illness Patient is a 69 y.o. male CAD s/p PCI, PAF on Xarelto, DM-2, HTN, HLD, CKD stage IIIa, bipolar disorder, OSA on CPAP-presented to the ED with aphasia. MRI brain negative for acute intracranial abnormality. MRI of the brain shows diminished flow in the right vertebral artery; encephalopathy.     PT Comments    Pt is making slow progress towards his goals. He demonstrated improved technique and good carryover with transfers after educating pt on sequence of scooting anteriorly, maintaining hands on current seated surface until buttocks clears surface, and gaining momentum through rocking his trunk anteriorly to gain "nose over toes". However, he continued to require modA for all transfers and minA to ambulate with a RW ~4 ft due to unsteadiness noted in his knees. Focused remainder of session on improving leg muscular strength and endurance along with aerobic endurance as his HR increases with all activity. See General Commends below for changes in vitals during session. Will continue to follow acutely. Current recommendations remain appropriate.  Follow Up Recommendations  SNF;Supervision/Assistance - 24 hour     Equipment Recommendations  None recommended by PT    Recommendations for Other Services       Precautions / Restrictions Precautions Precautions: Fall Precaution Comments: condom catheter; monitor HR Restrictions Weight Bearing Restrictions: No    Mobility  Bed Mobility Overal bed mobility: Needs Assistance Bed Mobility: Supine to Sit     Supine to sit: Min guard;HOB elevated     General bed mobility comments: Extra time and use of bed rails to come to sit R EOB. VCs for LE management off EOB and to sequence trunk lean to R to bring L hip anteriorly to square hips with EOB.  Transfers Overall transfer level: Needs  assistance Equipment used: Rolling walker (2 wheeled) Transfers: Sit to/from [employer] Sit to Stand: Mod assist Stand pivot transfers: Mod assist       General transfer comment: Required 2 attempts to successfully come to stand from EOB and to come to stand from chair. VCs provided to sequence scooting anteriorly, placing and maintaining hands oin current seated surface until buttocks clears surface, and gaining momentum by rocking anteriorly bringing "nose over toes", with good carryover noted and improved initial power up to rise speed with subsequent attempts. ModA to manage RW and sequence steps for stand step transfer to R.  Ambulation/Gait Ambulation/Gait assistance: Min assist Gait Distance (Feet): 4 Feet Assistive device: Rolling walker (2 wheeled) Gait Pattern/deviations: Step-through pattern;Decreased stride length;Shuffle;Trunk flexed Gait velocity: decreased Gait velocity interpretation: <1.31 ft/sec, indicative of household ambulator General Gait Details: Small shuffling steps with B knee instability noted to ambulate anteriorly with chair follow. No overt LOB, but minA to maintain safety,   Stairs             Wheelchair Mobility    Modified Rankin (Stroke Patients Only)       Balance Overall balance assessment: Needs assistance Sitting-balance support: Feet supported;Bilateral upper extremity supported Sitting balance-Leahy Scale: Good Sitting balance - Comments: B UE support and min guard assist for safety, no LOB or trunk sway noted.   Standing balance support: Bilateral upper extremity supported Standing balance-Leahy Scale: Poor Standing balance comment: B UE support on RW and minA-min guard assist for static standing balance due to B knee unsteadiness noted.  Cognition Arousal/Alertness: Awake/alert Behavior During Therapy: Flat affect Overall Cognitive Status: Impaired/Different from  baseline Area of Impairment: Safety/judgement;Awareness;Problem solving                         Safety/Judgement: Decreased awareness of safety;Decreased awareness of deficits Awareness: Emergent Problem Solving: Slow processing;Decreased initiation;Difficulty sequencing;Requires verbal cues;Requires tactile cues General Comments: Flat affect during session, requiring step-by-step repeated cues to sequence functional mobility. Pt able to identify deficits but required assistance to probolem solve solutions.      Exercises General Exercises - Lower Extremity Long Arc Quad: Strengthening;Both;15 reps;Seated (holding ~1-2 seconds in extension, slow descent) Hip Flexion/Marching: Strengthening;20 reps;Seated (to increase endurance) Mini-Sqauts: Strengthening;Both;Other (comment) (2 sets of 3 reps with bilat UE on chair, clearing buttocks)    General Comments General comments (skin integrity, edema, etc.): Pt reported lightheadedness with changes in position, which improved 1/4x with gaze fixation. BP = 115/89 supine, 119/93 sitting, 132/82 following transfer. SpO2 remained >/= 96% throughout. HR would rise from 110s at rest to as high as 141 with gait.      Pertinent Vitals/Pain Pain Assessment: No/denies pain    Home Living                      Prior Function            PT Goals (current goals can now be found in the care plan section) Acute Rehab PT Goals Patient Stated Goal: to get more stable PT Goal Formulation: With patient Time For Goal Achievement: 10/22/20 Potential to Achieve Goals: Good Progress towards PT goals: Progressing toward goals    Frequency    Min 3X/week      PT Plan Current plan remains appropriate    Co-evaluation              AM-PAC PT "6 Clicks" Mobility   Outcome Measure  Help needed turning from your back to your side while in a flat bed without using bedrails?: A Little Help needed moving from lying on your back to  sitting on the side of a flat bed without using bedrails?: A Little Help needed moving to and from a bed to a chair (including a wheelchair)?: A Lot Help needed standing up from a chair using your arms (e.g., wheelchair or bedside chair)?: A Lot Help needed to walk in hospital room?: A Lot Help needed climbing 3-5 steps with a railing? : A Lot 6 Click Score: 14    End of Session Equipment Utilized During Treatment: Gait belt Activity Tolerance: Patient tolerated treatment well Patient left: in chair;with call bell/phone within reach;with chair alarm set;with nursing/sitter in room Nurse Communication: Mobility status;Other (comment) (changes in BP and HR) PT Visit Diagnosis: Unsteadiness on feet (R26.81);Difficulty in walking, not elsewhere classified (R26.2);Muscle weakness (generalized) (M62.81);Other symptoms and signs involving the nervous system (R29.898);Other abnormalities of gait and mobility (R26.89)     Time: 5809-9833 PT Time Calculation (min) (ACUTE ONLY): 37 min  Charges:  $Therapeutic Exercise: 8-22 mins $Therapeutic Activity: 8-22 mins                     Raymond Gurney, PT, DPT Acute Rehabilitation Services  Pager: [phone] Office: [phone]    Jewel Baize 10/15/2020, 9:23 AM

## 2020-10-15 NOTE — Progress Notes (Signed)
Speech Language Pathology Treatment: Cognitive-Linquistic  Patient Details Name: Christian [name] MRN: [id number] DOB: 1951-03-01 Today's Date: 10/15/2020 Time: 1350-1400 SLP Time Calculation (min) (ACUTE ONLY): 10 min  Assessment / Plan / Recommendation Clinical Impression  Pt was seen for cognitive-linguistic treatment. He was alert, cooperative and oriented X4. Pt engaged in conversation with SLP and his verbal expression was fluent and appropriate. No word-finding difficulties or hesitations were observed. Auditory comprehension of both yes/no questions and sequential commands was Christian [name]. All other areas of cognition were observed to be Christian [name]. Pt has made great improvements and has met all of his goals. SLP will s/o at this time.   HPI HPI: Christian [name] is a 69 y.o. male with medical history significant of CAD with PCI and stenting x2, paroxysmal A. fib on Xarelto, IDDM, HLD, HTN, OSA on CPAP, PAD, CKD stage III, bipolar disorder. Presented to PCP with left-sided facial droop, right-sided weakness, and slurred speech. Sent to ED. MRI head showed no acute findings. 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 Severe stenosis of both ICA siphons with mild to moderate R ACA origin stenosis. 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      SLP Plan  All goals met;Discharge SLP treatment due to (comment) (pt at baseline level of functioning)       Recommendations                   Oral Care Recommendations: Oral care BID Follow up Recommendations: Skilled Nursing facility SLP Visit Diagnosis: Aphasia (R47.01) Plan: All goals met;Discharge SLP treatment due to (comment) (pt at baseline level of functioning)       GO                Christian [name] 10/15/2020, 2:10 PM

## 2020-10-16 DIAGNOSIS — F319 Bipolar disorder, unspecified: Secondary | ICD-10-CM | POA: Diagnosis not present

## 2020-10-16 DIAGNOSIS — G9341 Metabolic encephalopathy: Secondary | ICD-10-CM | POA: Diagnosis not present

## 2020-10-16 DIAGNOSIS — R41841 Cognitive communication deficit: Secondary | ICD-10-CM | POA: Diagnosis not present

## 2020-10-16 DIAGNOSIS — Z7401 Bed confinement status: Secondary | ICD-10-CM | POA: Diagnosis not present

## 2020-10-16 DIAGNOSIS — G459 Transient cerebral ischemic attack, unspecified: Secondary | ICD-10-CM | POA: Diagnosis not present

## 2020-10-16 DIAGNOSIS — E1022 Type 1 diabetes mellitus with diabetic chronic kidney disease: Secondary | ICD-10-CM | POA: Diagnosis not present

## 2020-10-16 DIAGNOSIS — E43 Unspecified severe protein-calorie malnutrition: Secondary | ICD-10-CM | POA: Diagnosis not present

## 2020-10-16 DIAGNOSIS — E139 Other specified diabetes mellitus without complications: Secondary | ICD-10-CM | POA: Diagnosis not present

## 2020-10-16 DIAGNOSIS — D649 Anemia, unspecified: Secondary | ICD-10-CM | POA: Diagnosis not present

## 2020-10-16 DIAGNOSIS — M6281 Muscle weakness (generalized): Secondary | ICD-10-CM | POA: Diagnosis not present

## 2020-10-16 DIAGNOSIS — R2681 Unsteadiness on feet: Secondary | ICD-10-CM | POA: Diagnosis not present

## 2020-10-16 DIAGNOSIS — I2782 Chronic pulmonary embolism: Secondary | ICD-10-CM | POA: Diagnosis not present

## 2020-10-16 DIAGNOSIS — I639 Cerebral infarction, unspecified: Secondary | ICD-10-CM | POA: Diagnosis not present

## 2020-10-16 DIAGNOSIS — R5381 Other malaise: Secondary | ICD-10-CM | POA: Diagnosis not present

## 2020-10-16 DIAGNOSIS — I480 Paroxysmal atrial fibrillation: Secondary | ICD-10-CM | POA: Diagnosis not present

## 2020-10-16 DIAGNOSIS — R262 Difficulty in walking, not elsewhere classified: Secondary | ICD-10-CM | POA: Diagnosis not present

## 2020-10-16 DIAGNOSIS — E1169 Type 2 diabetes mellitus with other specified complication: Secondary | ICD-10-CM | POA: Diagnosis not present

## 2020-10-16 DIAGNOSIS — I2510 Atherosclerotic heart disease of native coronary artery without angina pectoris: Secondary | ICD-10-CM | POA: Diagnosis not present

## 2020-10-16 DIAGNOSIS — N179 Acute kidney failure, unspecified: Secondary | ICD-10-CM | POA: Diagnosis not present

## 2020-10-16 DIAGNOSIS — E039 Hypothyroidism, unspecified: Secondary | ICD-10-CM | POA: Diagnosis not present

## 2020-10-16 DIAGNOSIS — I10 Essential (primary) hypertension: Secondary | ICD-10-CM | POA: Diagnosis not present

## 2020-10-16 DIAGNOSIS — N183 Chronic kidney disease, stage 3 unspecified: Secondary | ICD-10-CM | POA: Diagnosis not present

## 2020-10-16 DIAGNOSIS — R4701 Aphasia: Secondary | ICD-10-CM | POA: Diagnosis not present

## 2020-10-16 DIAGNOSIS — N1831 Chronic kidney disease, stage 3a: Secondary | ICD-10-CM | POA: Diagnosis not present

## 2020-10-16 DIAGNOSIS — I679 Cerebrovascular disease, unspecified: Secondary | ICD-10-CM | POA: Diagnosis not present

## 2020-10-16 DIAGNOSIS — M2550 Pain in unspecified joint: Secondary | ICD-10-CM | POA: Diagnosis not present

## 2020-10-16 DIAGNOSIS — E1122 Type 2 diabetes mellitus with diabetic chronic kidney disease: Secondary | ICD-10-CM | POA: Diagnosis not present

## 2020-10-16 LAB — GLUCOSE, CAPILLARY
Glucose-Capillary: 142 mg/dL — ABNORMAL HIGH (ref 70–99)
Glucose-Capillary: 226 mg/dL — ABNORMAL HIGH (ref 70–99)
Glucose-Capillary: 195 mg/dL — ABNORMAL HIGH (ref 70–99)

## 2020-10-16 MED ORDER — METOPROLOL TARTRATE 25 MG PO TABS
25.0000 mg | ORAL_TABLET | Freq: Two times a day (BID) | ORAL | Status: DC
  Administered 2020-10-16: 25 mg via ORAL
  Filled 2020-10-16: qty 1

## 2020-10-16 MED ORDER — METOPROLOL TARTRATE 25 MG PO TABS
25.0000 mg | ORAL_TABLET | Freq: Two times a day (BID) | ORAL | Status: DC
Start: 2020-10-16 — End: 2021-04-06

## 2020-10-16 NOTE — Progress Notes (Addendum)
Pt states that he has received the flu vaccine at [facility] this year, and has received a pneumonia vaccine within the past 5 years.

## 2020-10-16 NOTE — Progress Notes (Signed)
Pt discharged at this time via [facility] to [place].  Has all belongings with him including his cell phone and charger, dentures, and denture cleaner.

## 2020-10-16 NOTE — TOC Transition Note (Signed)
Transition of Care [facility]) - CM/SW Discharge Note   Patient Details  Name: Christian [name] MRN: [id number] Date of Birth: 06-Jan-1951  Transition of Care [facility]) CM/SW Contact:  Baldemar Lenis, LCSW Phone Number: 10/16/2020, 3:29 PM   Clinical Narrative:   Nurse to call report to [phone], Room B15-1.    Final next level of care: Skilled Nursing Facility Barriers to Discharge: Barriers Resolved   Patient Goals and CMS Choice Patient states their goals for this hospitalization and ongoing recovery are:: guardian indicated goal to get rehab for the patient CMS Medicare.gov Compare Post Acute Care list provided to:: Legal Guardian Choice offered to / list presented to : [facility] POA / Guardian  Discharge Placement              Patient chooses bed at:  [facility]) Patient to be transferred to facility by: [facility] Name of family member notified: [name] Patient and family notified of of transfer: 10/16/20  Discharge Plan and Services     Post Acute Care Choice: Skilled Nursing Facility                               Social Determinants of Health (SDOH) Interventions     Readmission Risk Interventions No flowsheet data found.

## 2020-10-16 NOTE — Progress Notes (Signed)
Physical Therapy Treatment Patient Details Name: Christian [name] MRN: [id number] DOB: 1951/06/05 Today's Date: 10/16/2020    History of Present Illness Patient is a 69 y.o. male CAD s/p PCI, PAF on Xarelto, DM-2, HTN, HLD, CKD stage IIIa, bipolar disorder, OSA on CPAP-presented to the ED with aphasia. MRI brain negative for acute intracranial abnormality. MRI of the brain shows diminished flow in the right vertebral artery; encephalopathy.     PT Comments    Pt is making steady but slow progress towards his goals. For example, he was able to ambulate slightly further this date with only min guard assist. However, his HR rose to 147, thus held off on further gait attempts this date. Nurse notified. The pt demonstrated good learning but continues to require some cues to sequence steps to set up for transfers. He continues to require modA to complete a transfer as he tends to get stuck about half-way up during power up to stand. Pt was also able to perform increased reps and sets of mini squats this date, indicating improved endurance. Will continue to follow acutely. Current recommendations remain appropriate. Awaiting bed for d/c to SNF at this time.    Follow Up Recommendations  SNF;Supervision/Assistance - 24 hour     Equipment Recommendations  None recommended by PT (defer to next venue)    Recommendations for Other Services       Precautions / Restrictions Precautions Precautions: Fall Precaution Comments: condom catheter; monitor HR    Mobility  Bed Mobility Overal bed mobility: Needs Assistance Bed Mobility: Supine to Sit     Supine to sit: Min guard;HOB elevated     General bed mobility comments: Extra time and use of bed rails to come to sit R EOB. VCs for LE management off EOB and to reach L hand to bed rail at end of bed to pull and assist with L hip moving anteriorly to square hips with EOB.  Transfers Overall transfer level: Needs assistance Equipment used:  Rolling walker (2 wheeled) Transfers: Sit to/from [employer] Sit to Stand: Mod assist Stand pivot transfers: Min assist       General transfer comment: Required 2 attempts to successfully come to stand from EOB and 1 attempt to come to stand from chair. VCs provided to sequence scooting anteriorly, placing and maintaining hands on current seated surface until buttocks clears surface, and gaining momentum by rocking anteriorly bringing "nose over toes", with good carryover and learning noted and improved initial power up to rise speed with subsequent attempts compared to previous session. ModA to complete power up to stand as pt tends to get stuck half-way. MinA to manage RW and sequence steps for stand step transfer to R.  Ambulation/Gait Ambulation/Gait assistance: Min assist Gait Distance (Feet): 6 Feet Assistive device: Rolling walker (2 wheeled) Gait Pattern/deviations: Step-through pattern;Decreased stride length;Shuffle;Trunk flexed Gait velocity: decreased Gait velocity interpretation: <1.31 ft/sec, indicative of household ambulator General Gait Details: Small shuffling steps with B knee instability noted to ambulate anteriorly with chair follow. No overt LOB, but min guard assist to maintain safety. Cued pt to increase step length, with min success.   Stairs             Wheelchair Mobility    Modified Rankin (Stroke Patients Only)       Balance Overall balance assessment: Needs assistance Sitting-balance support: Feet supported;Bilateral upper extremity supported Sitting balance-Leahy Scale: Good Sitting balance - Comments: B UE support and min guard assist for safety, no  LOB or trunk sway noted.   Standing balance support: Bilateral upper extremity supported Standing balance-Leahy Scale: Poor Standing balance comment: B UE support on RW and min guard assist for static standing balance due to B knee unsteadiness noted.                             Cognition Arousal/Alertness: Awake/alert Behavior During Therapy: Flat affect Overall Cognitive Status: Impaired/Different from baseline Area of Impairment: Safety/judgement;Awareness;Problem solving                         Safety/Judgement: Decreased awareness of safety;Decreased awareness of deficits Awareness: Emergent Problem Solving: Slow processing;Decreased initiation;Difficulty sequencing;Requires verbal cues;Requires tactile cues General Comments: Flat affect during session, requiring step-by-step repeated cues to sequence functional mobility. Pt able to identify deficits but required assistance to probolem solve solutions.      Exercises General Exercises - Lower Extremity Ankle Circles/Pumps: Strengthening;Both;20 reps;Seated Long Arc Quad: Strengthening;Both;15 reps;Seated (holding ~1-2 seconds in extension, slow descent) Hip Flexion/Marching: Strengthening;20 reps;Seated (to increase endurance) Mini-Sqauts: Strengthening;Both;Other (comment);5 reps (x2 sets, with bilat UE on chair, clearing buttocks)    General Comments General comments (skin integrity, edema, etc.): HR 100-110s at rest, HR would rise to 120-130s with exercises but rose to 147 during gait, thus held off on further gait attempts this date      Pertinent Vitals/Pain Pain Assessment: No/denies pain    Home Living                      Prior Function            PT Goals (current goals can now be found in the care plan section) Acute Rehab PT Goals Patient Stated Goal: to get better and walk PT Goal Formulation: With patient Time For Goal Achievement: 10/22/20 Potential to Achieve Goals: Good Progress towards PT goals: Progressing toward goals (slowly)    Frequency    Min 3X/week      PT Plan Current plan remains appropriate    Co-evaluation              AM-PAC PT "6 Clicks" Mobility   Outcome Measure  Help needed turning from your back to your side  while in a flat bed without using bedrails?: A Little Help needed moving from lying on your back to sitting on the side of a flat bed without using bedrails?: A Little Help needed moving to and from a bed to a chair (including a wheelchair)?: A Little Help needed standing up from a chair using your arms (e.g., wheelchair or bedside chair)?: A Lot Help needed to walk in hospital room?: A Lot Help needed climbing 3-5 steps with a railing? : A Lot 6 Click Score: 15    End of Session Equipment Utilized During Treatment: Gait belt Activity Tolerance: Patient tolerated treatment well Patient left: in chair;with call bell/phone within reach;with chair alarm set Nurse Communication: Mobility status;Other (comment) (changes in HR) PT Visit Diagnosis: Unsteadiness on feet (R26.81);Difficulty in walking, not elsewhere classified (R26.2);Muscle weakness (generalized) (M62.81);Other symptoms and signs involving the nervous system (R29.898);Other abnormalities of gait and mobility (R26.89)     Time: 5631-4970 PT Time Calculation (min) (ACUTE ONLY): 37 min  Charges:  $Therapeutic Exercise: 8-22 mins $Therapeutic Activity: 8-22 mins                     Farah Lepak Pettis, PT,  DPT Acute Rehabilitation Services  Pager: [phone] Office: [phone]    Jewel Baize 10/16/2020, 8:57 AM

## 2020-10-16 NOTE — Discharge Summary (Signed)
PATIENT DETAILS Name: Christian [name] Age: 69 y.o. Sex: male Date of Birth: 1951-03-12 MRN: [id number]. Admitting Physician: Christian General, MD PCP:Sun, Charise Carwin, MD  Admit Date: 10/07/2020 Discharge date: 10/16/2020  Recommendations for Outpatient Follow-up:  1. Follow up with PCP in 1-2 weeks 2. Please obtain CMP/CBC in one week 3. Please ensure follow-up with patient's primary cardiologist  Admitted From:  Home  Disposition: SNF   Home Health: No  Equipment/Devices: None  Discharge Condition: Stable  CODE STATUS: FULL CODE  Diet recommendation:  Diet Order            Diet - low sodium heart healthy           Diet Carb Modified           Diet heart healthy/carb modified Room service appropriate? Yes; Fluid consistency: Thin  Diet effective now                  Brief Summary: Patient is a 69 y.o. male CAD s/p PCI, PAF on Xarelto, DM-2, HTN, HLD, CKD stage IIIa, bipolar disorder, OSA on CPAP-presented to the ED after he had difficulty formulating sentences.  He was subsequently admitted for further evaluation and treatment.  Significant events: 11/9>> admit with aphasia  Significant studies: 11/9>> chest x-ray: No pneumonia. 11/9>> CT head: No acute intracranial process 11/9>> MRI brain: No acute intracranial abnormality. 11/9>> MRI brain: Chronically advanced intracranial atherosclerosis. 11/10>> Echo: EF 65-70%, grade 1 diastolic dysfunction. 11/10>> carotid Doppler: No significant stenosis 11/10>> LDL: 81 11/10>> A1c 11.1 11/10>> EEG: No seizures 11/11>> CT head: No evidence of acute intracranial abnormality.  Antimicrobial therapy: None  Microbiology data: None  Procedures : 11/10>> EEG:  No seizures.  Consults: Neurology   Brief Hospital Course: Aphasia: Speech has significantly improved overnight-MRI brain on 11/9 - for CVA-CT head repeated on 11/11 morning-which does not show any acute abnormalities.  Suspect in  retrospect-that this probably was more from encephalopathy from AKI.  EEG was negative for seizures-no further recommendations from neurology.  Acute metabolic encephalopathy: Likely secondary to AKI-no indication of infection-UA/chest x-ray negative.  Afebrile.  Significantly better-suspect mental status is now at baseline.  AKI on CKD stage IIIa: AKI likely hemodynamically mediated-improved with supportive care-creatinine has plateaued around 2.0 range.  Continue to avoid nephrotoxic agents-repeat electrolytes periodically.  Follow-up with primary care practitioner-consider outpatient referral to nephrology.  DM-2: CBGs were controlled with SSI-resume glimepiride and Jardiance on discharge.  HTN: BP has been running in the low 100 range-patient asymptomatic-metoprolol initially half at home dose-but discontinued.  Please reassess at SNF and start antihypertensives accordingly.  Lisinopril remains on hold since admission.  Hypothyroidism: Continue Cytomel.  TSH within normal limits.  CAD s/p PCI to LAD on 04/29/2020: Reviewed outpatient cardiology note-continue Plavix.  PAF: Currently in sinus rhythm-continue Xarelto and metoprolol.  Note-appears to be on both Plavix and Xarelto-being followed by cardiology in the outpatient setting-defer further adjustments to his primary cardiologist at [facility] (spoke at length regarding this with patient's daughter).  Bipolar disorder: Continue Seroquel,Wellbutrin and Depakote.  OSA: CPAP nightly.  Obesity: Estimated body mass index is 38.11 kg/m as calculated from the following:   Height as of this encounter: 6' (1.829 m).   Weight as of this encounter: 127.5 kg.    RN pressure injury documentation: Pressure Injury 08/04/18 Stage II -  Partial thickness loss of dermis presenting as a shallow open ulcer with a red, pink wound bed without slough. (Active)  08/04/18 2253  Location: Buttocks  Location Orientation: Left;Right  Staging: Stage II -   Partial thickness loss of dermis presenting as a shallow open ulcer with a red, pink wound bed without slough.  Wound Description (Comments):   Present on Admission: Yes     Discharge Diagnoses:  Aphasia Acute metabolic encephalopathy AKI on CKD stage IIIa HTN CAD PAF   Discharge Instructions:  Activity:  As tolerated with Full fall precautions use walker/cane & assistance as needed  Discharge Instructions    Call MD for:  difficulty breathing, headache or visual disturbances   Complete by: As directed    Call MD for:  extreme fatigue   Complete by: As directed    Call MD for:  persistant dizziness or light-headedness   Complete by: As directed    Diet - low sodium heart healthy   Complete by: As directed    Diet Carb Modified   Complete by: As directed    Discharge instructions   Complete by: As directed    Follow with Primary MD  Christian James, MD in 1-2 weeks  Please follow-up with your primary cardiologist in 1-2 weeks.  Please get a complete blood count and chemistry panel checked by your Primary MD at your next visit, and again as instructed by your Primary MD.  Get Medicines reviewed and adjusted: Please take all your medications with you for your next visit with your Primary MD  Laboratory/radiological data: Please request your Primary MD to go over all hospital tests and procedure/radiological results at the follow up, please ask your Primary MD to get all Hospital records sent to his/her office.  In some cases, they will be blood work, cultures and biopsy results pending at the time of your discharge. Please request that your primary care M.D. follows up on these results.  Also Note the following: If you experience worsening of your admission symptoms, develop shortness of breath, life threatening emergency, suicidal or homicidal thoughts you must seek medical attention immediately by calling 911 or calling your MD immediately  if symptoms less severe.  You  must read complete instructions/literature along with all the possible adverse reactions/side effects for all the Medicines you take and that have been prescribed to you. Take any new Medicines after you have completely understood and accpet all the possible adverse reactions/side effects.   Do not drive when taking Pain medications or sleeping medications (Benzodaizepines)  Do not take more than prescribed Pain, Sleep and Anxiety Medications. It is not advisable to combine anxiety,sleep and pain medications without talking with your primary care practitioner  Special Instructions: If you have smoked or chewed Tobacco  in the last 2 yrs please stop smoking, stop any regular Alcohol  and or any Recreational drug use.  Wear Seat belts while driving.  Please note: You were cared for by a hospitalist during your hospital stay. Once you are discharged, your primary care physician will handle any further medical issues. Please note that NO REFILLS for any discharge medications will be authorized once you are discharged, as it is imperative that you return to your primary care physician (or establish a relationship with a primary care physician if you do not have one) for your post hospital discharge needs so that they can reassess your need for medications and monitor your lab values.   Check CBGs before meals and at bedtime.   Increase activity slowly   Complete by: As directed      Allergies as of 10/16/2020  Reactions   Amlodipine Swelling, Other (See Comments)   Legs became swollen   Onglyza [saxagliptin] Other (See Comments)   "Allergic," per [facility]   Zyprexa [olanzapine] Other (See Comments)   Allergy noted by ACT Team. No reaction specified (??)- "Allergic," per [facility]      Medication List    STOP taking these medications   amLODipine 10 MG tablet Commonly known as: NORVASC   aspirin 81 MG EC tablet   doxycycline 100 MG tablet Commonly known as:  VIBRA-TABS   furosemide 40 MG tablet Commonly known as: LASIX   Lantus SoloStar 100 UNIT/ML Solostar Pen Generic drug: insulin glargine   lisinopril 40 MG tablet Commonly known as: ZESTRIL   metoprolol succinate 50 MG 24 hr tablet Commonly known as: TOPROL-XL     TAKE these medications   acetaminophen 500 MG tablet Commonly known as: TYLENOL Take 500-1,000 mg by mouth every 8 (eight) hours as needed for mild pain or headache.   ARIPiprazole 20 MG tablet Commonly known as: ABILIFY Take 1 tablet (20 mg total) by mouth daily.   atorvastatin 10 MG tablet Commonly known as: LIPITOR Take 1 tablet (10 mg total) by mouth daily at 6 PM. What changed: when to take this   buPROPion 75 MG tablet Commonly known as: WELLBUTRIN Take 1 tablet (75 mg total) by mouth daily.   clopidogrel 75 MG tablet Commonly known as: PLAVIX Take 75 mg by mouth daily.   divalproex 500 MG DR tablet Commonly known as: DEPAKOTE Take 1 tablet (500 mg total) by mouth every 12 (twelve) hours.   empagliflozin 10 MG Tabs tablet Commonly known as: JARDIANCE Take 10 mg by mouth daily.   glimepiride 2 MG tablet Commonly known as: AMARYL Take 2 mg by mouth daily with breakfast.   insulin aspart 100 UNIT/ML injection Commonly known as: novoLOG 0-15 Units, Subcutaneous, 3 times daily with meals CBG < 70: Implement Hypoglycemia measures CBG 70 - 120: 0 units CBG 121 - 150: 2 units CBG 151 - 200: 3 units CBG 201 - 250: 5 units CBG 251 - 300: 8 units CBG 301 - 350: 11 units CBG 351 - 400: 15 units CBG > 400: call MD   liothyronine 25 MCG tablet Commonly known as: CYTOMEL Take 25 mcg by mouth daily before breakfast.   metoprolol tartrate 25 MG tablet Commonly known as: LOPRESSOR Take 1 tablet (25 mg total) by mouth 2 (two) times daily.   mirtazapine 15 MG disintegrating tablet Commonly known as: REMERON SOL-TAB Take 1 tablet (15 mg total) by mouth at bedtime.   pantoprazole 40 MG  tablet Commonly known as: PROTONIX Take 1 tablet (40 mg total) by mouth daily at 12 noon.   polyethylene glycol powder 17 GM/SCOOP powder Commonly known as: GLYCOLAX/MIRALAX Take 17 g by mouth daily. What changed:   when to take this  additional instructions   QUEtiapine 25 MG tablet Commonly known as: SEROQUEL Take 25 mg by mouth 2 (two) times daily.   QUEtiapine 300 MG tablet Commonly known as: SEROQUEL Take 300 mg by mouth at bedtime.   rivaroxaban 20 MG Tabs tablet Commonly known as: XARELTO Take 1 tablet (20 mg total) by mouth daily with supper. What changed: when to take this       Contact information for follow-up providers    Christian James, MD. Schedule an appointment as soon as possible for a visit in 1 week(s).   Specialty: Family Medicine Contact information: [address] [place] [address] [phone]            Contact information for after-discharge care    Destination    [facility] .   Service: Skilled Nursing Contact information: [address] [place] [address] [phone]                 Allergies  Allergen Reactions  . Amlodipine Swelling and Other (See Comments)    Legs became swollen  . Onglyza [Saxagliptin] Other (See Comments)    "Allergic," per [facility]  . Zyprexa [Olanzapine] Other (See Comments)    Allergy noted by ACT Team. No reaction specified (??)- "Allergic," per [facility]       Other Procedures/Studies: CT Head Wo Contrast  Result Date: 10/09/2020 CLINICAL DATA:  Stroke, follow-up. EXAM: CT HEAD WITHOUT CONTRAST TECHNIQUE: Contiguous axial images were obtained from the base of the skull through the vertex without intravenous contrast. COMPARISON:  MRI/MRA head 10/07/2020.  Head CT 10/07/2020. FINDINGS: Brain: Mild generalized cerebral atrophy. Minimal ill-defined hypoattenuation within the cerebral white matter is  nonspecific, but compatible with chronic small vessel ischemic disease. There is no acute intracranial hemorrhage. No demarcated cortical infarct is identified. No extra-axial fluid collection. No evidence of intracranial mass. No midline shift. Vascular: No hyperdense vessel.  Atherosclerotic calcifications. Skull: Normal. Negative for fracture or focal lesion. Sinuses/Orbits: Visualized orbits show no acute finding. Mild ethmoid sinus mucosal thickening. Other: Small right mastoid effusion. IMPRESSION: No evidence of acute intracranial abnormality. Mild cerebral atrophy and chronic small vessel ischemic disease. Mild ethmoid sinus mucosal thickening. Small right mastoid effusion. Electronically Signed   By: Jackey Loge DO   On: 10/09/2020 07:31   CT HEAD WO CONTRAST  Result Date: 10/07/2020 CLINICAL DATA:  Neurologic deficit EXAM: CT HEAD WITHOUT CONTRAST TECHNIQUE: Contiguous axial images were obtained from the base of the skull through the vertex without intravenous contrast. COMPARISON:  08/22/2018 FINDINGS: Brain: No acute infarct or hemorrhage. Lateral ventricles and midline structures are unremarkable. No acute extra-axial fluid collections. No mass effect. Vascular: No hyperdense vessel. There is severe atherosclerosis of the bilateral internal carotid arteries, stable. Skull: Normal. Negative for fracture or focal lesion. Sinuses/Orbits: Minimal fluid within the right mastoid air cells. Paranasal sinuses are clear. Other: None. IMPRESSION: 1. No acute intrathoracic process. 2. Minimal fluid within the right mastoid air cells. 3. Stable atherosclerosis. Electronically Signed   By: Sharlet Salina M.D.   On: 10/07/2020 17:46   MR ANGIO HEAD WO CONTRAST  Result Date: 10/07/2020 CLINICAL DATA:  69 year old male with altered mental status, neurologic deficit, TIA. EXAM: MRA HEAD WITHOUT CONTRAST TECHNIQUE: Angiographic images of the Circle of Willis were obtained using MRA technique without intravenous  contrast. COMPARISON:  Brain MRI today.  Intracranial MRA 03/07/2011. FINDINGS: Severe stenosis of the diminutive distal right vertebral artery demonstrated in 2012, now with essentially absent flow signal in the right V4 segment. The distal left vertebral artery remains patent. Visible left PICA remains patent. The left vertebral supplies the basilar with moderate to severe increased stenosis at the left vertebrobasilar junction (series 1052, image 11. There is minimal reconstitution of the right vertebrobasilar junction. Patent basilar artery with chronic mild irregularity and stenosis. SCA and left PCA origins remain patent. Fetal right PCA origin again noted. Diminutive or absent left posterior communicating artery. Moderate to severe bilateral PCA P2 segment stenoses are chronic (image 1046 image 18 and 19) and not significantly changed.  Preserved distal PCA flow signal. Antegrade flow in both ICA siphons. Severe bilateral distal ICA irregularity and stenosis, on the left just proximal to the ICA terminus (series 1040, image 7) and on the right at the anterior genu (same image). These have substantially progressed since 2012. Carotid termini remain patent. Mild to moderate right ACA origin stenosis is increased. Anterior communicating artery and visible ACA branches are otherwise within normal limits. Left MCA M1 mild irregularity and stenosis has not significantly changed. Left MCA trifurcation remains patent. Visible left MCA branches are stable. Moderate chronic right MCA M1 stenosis is stable (series 10 40 image 5). Mild to moderate stenosis also at the right MCA bifurcation is stable (image 13). Visible right MCA branches appear stable. IMPRESSION: 1. Chronically advanced intracranial atherosclerosis. 2. The following have progressed since a 2012 MRA: - occlusion of the non-dominant distal Right Vertebral Artery. - Moderate to Severe stenosis at the Left Vertebrobasilar Junction. - Severe stenosis of both  ICA siphons (right anterior genu and left supraclinoid segment). - mild to moderate Right ACA origin stenosis. 3. The following are stable since 2012: - Moderate Right MCA M1 stenosis. - Moderate to Severe bilateral PCA P2 stenoses. Electronically Signed   By: Odessa Fleming M.D.   On: 10/07/2020 19:34   MR BRAIN WO CONTRAST  Result Date: 10/07/2020 CLINICAL DATA:  69 year old male with altered mental status, neurologic deficit, TIA. EXAM: MRI HEAD WITHOUT CONTRAST TECHNIQUE: Multiplanar, multiecho pulse sequences of the brain and surrounding structures were obtained without intravenous contrast. COMPARISON:  Head CT earlier today.  Brain MRI 03/07/2011. FINDINGS: Brain: No restricted diffusion to suggest acute infarction. No midline shift, mass effect, evidence of mass lesion, ventriculomegaly, extra-axial collection or acute intracranial hemorrhage. Cervicomedullary junction and pituitary are within normal limits. Mild for age vague, patchy cerebral white matter T2 and FLAIR hyperintensity is stable. No cortical encephalomalacia identified. Chronic lacunar infarct medial left pons is subtle and was better visualized in 2012. No chronic cerebral blood products identified. Deep gray matter nuclei and cerebellum remain within normal limits. Vascular: Major intracranial vascular flow voids appear stable since 2012 including decreased distal right vertebral artery flow void (series 15, image 3 today). Generalized intracranial artery tortuosity again noted. Skull and upper cervical spine: Visible bone marrow signal and cervical spine appear stable since 2012. Sinuses/Orbits: Stable, negative.  Hyperplastic paranasal sinuses. Other: Mild chronic mastoid effusions, regressed on the left but increased on the right since 2012. Still, significance is doubtful. Other visible internal auditory structures appear normal. Visible scalp and face appear negative. IMPRESSION: 1. No acute intracranial abnormality. 2. Mild for age  chronic small vessel disease, not significantly changed since 2012. 3. Evidence of chronically decreased flow in the distal right vertebral artery. See also MRA today reported separately. Electronically Signed   By: Odessa Fleming M.D.   On: 10/07/2020 19:25   US RENAL  Result Date: 10/07/2020 CLINICAL DATA:  Acute kidney injury EXAM: RENAL / URINARY TRACT ULTRASOUND COMPLETE COMPARISON:  Renal ultrasound 07/31/2014, CT abdomen pelvis 08/01/2014 FINDINGS: Right Kidney: Renal measurements: 11.2 x 5.4 x 4.4 cm = volume: 139 mL. Echogenicity is within normal limits. No concerning renal mass, shadowing calculus or hydronephrosis. Left Kidney: Renal measurements: 11.9 x 6.1 x 5.8 cm = volume: 219 mL. Echogenicity is within normal limits. No concerning renal mass, shadowing calculus or hydronephrosis. Bladder: Moderate bladder distention without wall thickening, calculi or debris. Other: None. IMPRESSION: Moderate bladder distension without other concerning bladder abnormality. Otherwise unremarkable renal ultrasound.  Electronically Signed   By: Kreg Shropshire M.D.   On: 10/07/2020 19:35   DG Chest Portable 1 View  Result Date: 10/07/2020 CLINICAL DATA:  Slurred speech EXAM: PORTABLE CHEST 1 VIEW COMPARISON:  Mar 31, 2020 FINDINGS: No edema or airspace opacity. Heart is slightly enlarged with pulmonary vascularity normal. No adenopathy. No bone lesions. IMPRESSION: Heart slightly enlarged.  No edema or airspace opacity. Electronically Signed   By: Bretta Bang III M.D.   On: 10/07/2020 15:01   EEG adult  Result Date: 10/09/2020 Charlsie Quest, MD     10/09/2020 11:06 AM Patient Name: Jesper Stirewalt Stowers MRN: [id number] Epilepsy Attending: Charlsie Quest Referring Physician/Provider: Dr Delia Heady Date: 10/08/2020 Duration: 25.15 mins Patient history: 69 year old male with encephalopathy as well as speech disturbance. EEG to evaluate for seizures. Level of alertness: Awake, drowsy AEDs during EEG study: Depakote  Technical aspects: This EEG study was done with scalp electrodes positioned according to the 10-20 International system of electrode placement. Electrical activity was acquired at a sampling rate of 500Hz  and reviewed with a high frequency filter of 70Hz  and a low frequency filter of 1Hz . EEG data were recorded continuously and digitally stored. Description: The posterior dominant rhythm consists of 8-9 Hz activity of moderate voltage (25-35 uV) seen predominantly in posterior head regions, symmetric and reactive to eye opening and eye closing. Drowsiness was characterized by attenuation of the posterior background rhythm.  Hyperventilation and photic stimulation were not performed.   IMPRESSION: This study is within normal limits. No seizures or epileptiform discharges were seen throughout the recording. Charlsie Quest   ECHOCARDIOGRAM COMPLETE  Result Date: 10/08/2020    ECHOCARDIOGRAM REPORT   Patient Name:   ARTEMUS ROMANOFF Date of Exam: 10/08/2020 Medical Rec #:  [id number]        Height:       72.0 in Accession #:    [id number]       Weight:       281.0 lb Date of Birth:  08/28/1951        BSA:          2.462 m Patient Age:    54 years         BP:           123/72 mmHg Patient Gender: M                HR:           86 bpm. Exam Location:  Inpatient Procedure: 2D Echo and Intracardiac Opacification Agent Indications:    TIA 435.9 / G45.9  History:        Patient has prior history of Echocardiogram examinations, most                 recent 03/18/2020. Risk Factors:Hypertension, Diabetes and Sleep                 Apnea. Chronic kidney disease.  Sonographer:    Leta Jungling RDCS Referring Phys: [id number] Christian [name]  Sonographer Comments: Technically difficult study due to poor echo windows. IMPRESSIONS  1. Left ventricular ejection fraction, by estimation, is 65 to 70%. The left ventricle has normal function. The left ventricle has no regional wall motion abnormalities. There is mild left ventricular  hypertrophy. Left ventricular diastolic parameters are consistent with Grade I diastolic dysfunction (impaired relaxation).  2. Right ventricular systolic function is normal. The right ventricular size is normal.  3. The mitral valve is normal  in structure. No evidence of mitral valve regurgitation. No evidence of mitral stenosis. Severe mitral annular calcification.  4. The aortic valve is tricuspid. There is moderate calcification of the aortic valve. There is moderate thickening of the aortic valve. Aortic valve regurgitation is not visualized. Mild to moderate aortic valve sclerosis/calcification is present, without any evidence of aortic stenosis.  5. The inferior vena cava is normal in size with greater than 50% respiratory variability, suggesting right atrial pressure of 3 mmHg. Conclusion(s)/Recommendation(s): No intracardiac source of embolism detected on this transthoracic study. A transesophageal echocardiogram is recommended to exclude cardiac source of embolism if clinically indicated. FINDINGS  Left Ventricle: Left ventricular ejection fraction, by estimation, is 65 to 70%. The left ventricle has normal function. The left ventricle has no regional wall motion abnormalities. Definity contrast agent was given IV to delineate the left ventricular  endocardial borders. The left ventricular internal cavity size was normal in size. There is mild left ventricular hypertrophy. Left ventricular diastolic parameters are consistent with Grade I diastolic dysfunction (impaired relaxation). Right Ventricle: The right ventricular size is normal. No increase in right ventricular wall thickness. Right ventricular systolic function is normal. Left Atrium: Left atrial size was normal in size. Right Atrium: Right atrial size was normal in size. Pericardium: There is no evidence of pericardial effusion. Mitral Valve: The mitral valve is normal in structure. There is moderate thickening of the mitral valve leaflet(s).  There is moderate calcification of the mitral valve leaflet(s). Severe mitral annular calcification. No evidence of mitral valve regurgitation. No evidence of mitral valve stenosis. Tricuspid Valve: The tricuspid valve is normal in structure. Tricuspid valve regurgitation is not demonstrated. No evidence of tricuspid stenosis. Aortic Valve: The aortic valve is tricuspid. There is moderate calcification of the aortic valve. There is moderate thickening of the aortic valve. Aortic valve regurgitation is not visualized. Mild to moderate aortic valve sclerosis/calcification is present, without any evidence of aortic stenosis. Pulmonic Valve: The pulmonic valve was normal in structure. Pulmonic valve regurgitation is not visualized. No evidence of pulmonic stenosis. Aorta: The aortic root is normal in size and structure. Venous: The inferior vena cava is normal in size with greater than 50% respiratory variability, suggesting right atrial pressure of 3 mmHg. IAS/Shunts: No atrial level shunt detected by color flow Doppler.  LEFT VENTRICLE PLAX 2D LVIDd:         4.20 cm      Diastology LVIDs:         3.05 cm      LV e' medial:    5.00 cm/s LV PW:         1.00 cm      LV E/e' medial:  12.9 LV IVS:        1.20 cm      LV e' lateral:   6.09 cm/s LVOT diam:     2.05 cm      LV E/e' lateral: 10.6 LV SV:         50 LV SV Index:   20 LVOT Area:     3.30 cm  LV Volumes (MOD) LV vol d, MOD A2C: 78.6 ml LV vol d, MOD A4C: 172.0 ml LV vol s, MOD A2C: 31.1 ml LV vol s, MOD A4C: 46.9 ml LV SV MOD A2C:     47.5 ml LV SV MOD A4C:     172.0 ml LV SV MOD BP:      82.0 ml RIGHT VENTRICLE TAPSE (M-mode): 2.3 cm LEFT ATRIUM  Index       RIGHT ATRIUM           Index LA diam:        3.50 cm 1.42 cm/m  RA Area:     14.60 cm LA Vol (A2C):   43.0 ml 17.47 ml/m RA Volume:   30.30 ml  12.31 ml/m LA Vol (A4C):   42.3 ml 17.18 ml/m LA Biplane Vol: 44.1 ml 17.91 ml/m  AORTIC VALVE LVOT Vmax:   91.60 cm/s LVOT Vmean:  63.800 cm/s LVOT  VTI:    0.150 m  AORTA Ao Root diam: 3.30 cm Ao Asc diam:  3.40 cm MITRAL VALVE MV Area (PHT): 4.54 cm    SHUNTS MV Decel Time: 167 msec    Systemic VTI:  0.15 m MV E velocity: 64.50 cm/s  Systemic Diam: 2.05 cm MV A velocity: 90.30 cm/s MV E/A ratio:  0.71 Donato Schultz MD Electronically signed by Donato Schultz MD Signature Date/Time: 10/08/2020/11:31:40 AM    Final    VAS US CAROTID  Result Date: 10/09/2020 Carotid Arterial Duplex Study Comparison Study:  09/02/2015- carotid artery duplex Performing Technologist: Gertie Fey MHA, RDMS, RVT, RDCS  Examination Guidelines: A complete evaluation includes B-mode imaging, spectral Doppler, color Doppler, and power Doppler as needed of all accessible portions of each vessel. Bilateral testing is considered an integral part of a complete examination. Limited examinations for reoccurring indications may be performed as noted.  Right Carotid Findings: +----------+--------+--------+--------+--------------------------+--------+           PSV cm/sEDV cm/sStenosisPlaque Description        Comments +----------+--------+--------+--------+--------------------------+--------+ CCA Prox  93      21                                                 +----------+--------+--------+--------+--------------------------+--------+ CCA Distal107     24              smooth and heterogenous            +----------+--------+--------+--------+--------------------------+--------+ ICA Prox  117     19              heterogenous and irregular         +----------+--------+--------+--------+--------------------------+--------+ ICA Distal75      31                                                 +----------+--------+--------+--------+--------------------------+--------+ ECA       263     24              heterogenous and irregular         +----------+--------+--------+--------+--------------------------+--------+  +----------+--------+-------+----------------+-------------------+           PSV cm/sEDV cmsDescribe        Arm Pressure (mmHG) +----------+--------+-------+----------------+-------------------+ [id number]            Multiphasic, WNL                    +----------+--------+-------+----------------+-------------------+ +---------+--------+--+--------+---------+ VertebralPSV cm/s34EDV cm/sAntegrade +---------+--------+--+--------+---------+  Left Carotid Findings: +----------+--------+--------+--------+-----------------------+--------+           PSV cm/sEDV cm/sStenosisPlaque Description     Comments +----------+--------+--------+--------+-----------------------+--------+ CCA Prox  159     26  smooth and heterogenous         +----------+--------+--------+--------+-----------------------+--------+ CCA Distal171     21              smooth and heterogenous         +----------+--------+--------+--------+-----------------------+--------+ ICA Prox  62      11              smooth and heterogenous         +----------+--------+--------+--------+-----------------------+--------+ ICA Distal72      23                                              +----------+--------+--------+--------+-----------------------+--------+ ECA       185     17                                              +----------+--------+--------+--------+-----------------------+--------+ +----------+--------+--------+----------------+-------------------+           PSV cm/sEDV cm/sDescribe        Arm Pressure (mmHG) +----------+--------+--------+----------------+-------------------+ [id number]              Multiphasic, WNL                    +----------+--------+--------+----------------+-------------------+ +---------+--------+--+--------+--+---------+ VertebralPSV cm/s39EDV cm/s11Antegrade +---------+--------+--+--------+--+---------+   Summary: Right Carotid:  Velocities in the right ICA are consistent with a 1-39% stenosis.                The ECA appears >50% stenosed. Left Carotid: Velocities in the left ICA are consistent with a 1-39% stenosis. Vertebrals:  Bilateral vertebral arteries demonstrate antegrade flow. Right              vertebral artery demonstrates high resistant flow. Subclavians: Normal flow hemodynamics were seen in bilateral subclavian              arteries. *See table(s) above for measurements and observations.  Electronically signed by Coral Else MD on 10/09/2020 at 7:33:18 AM.    Final      TODAY-DAY OF DISCHARGE:  Subjective:   Christian [name] today has no headache,no chest abdominal pain,no new weakness tingling or numbness, feels much better wants to go home today.   Objective:   Blood pressure 132/83, pulse 95, temperature 98.7 F (37.1 C), resp. rate 14, height 6' (1.829 m), weight 127.5 kg, SpO2 97 %.  Intake/Output Summary (Last 24 hours) at 10/16/2020 0929 Last data filed at 10/15/2020 2320 Gross per 24 hour  Intake --  Output 1200 ml  Net -1200 ml   Filed Weights   10/07/20 1457  Weight: 127.5 kg    Exam: Awake Alert, Oriented *3, No new F.N deficits, Normal affect [place].AT,PERRAL Supple Neck,No JVD, No cervical lymphadenopathy appriciated.  Symmetrical Chest wall movement, Good air movement bilaterally, CTAB RRR,No Gallops,Rubs or new Murmurs, No Parasternal Heave +ve B.Sounds, Abd Soft, Non tender, No organomegaly appriciated, No rebound -guarding or rigidity. No Cyanosis, Clubbing or edema, No new Rash or bruise   PERTINENT RADIOLOGIC STUDIES: No results found.   PERTINENT LAB RESULTS: CBC: No results for input(s): WBC, HGB, HCT, PLT in the last 72 hours. CMET CMP     Component Value Date/Time   NA 129 (L) 10/13/2020 0427   NA 139 08/10/2014 1327  K 4.5 10/13/2020 0427   K 4.3 08/10/2014 1327   CL 97 (L) 10/13/2020 0427   CL 105 08/10/2014 1327   CO2 24 10/13/2020 0427   CO2 27  08/10/2014 1327   GLUCOSE 125 (H) 10/13/2020 0427   GLUCOSE 109 (H) 08/10/2014 1327   BUN 49 (H) 10/13/2020 0427   BUN 13 08/10/2014 1327   CREATININE 2.13 (H) 10/13/2020 0427   CREATININE 1.25 08/10/2014 1327   CALCIUM 8.8 (L) 10/13/2020 0427   CALCIUM 8.8 08/10/2014 1327   PROT 7.5 10/07/2020 1534   PROT 7.0 08/10/2014 1327   ALBUMIN 3.5 10/07/2020 1534   ALBUMIN 3.3 (L) 08/10/2014 1327   AST 14 (L) 10/07/2020 1534   AST 96 (H) 08/10/2014 1327   ALT 13 10/07/2020 1534   ALT 187 (H) 08/10/2014 1327   ALKPHOS 52 10/07/2020 1534   ALKPHOS 81 08/10/2014 1327   BILITOT 0.7 10/07/2020 1534   BILITOT 0.3 08/10/2014 1327   GFRNONAA 33 (L) 10/13/2020 0427   GFRNONAA >60 08/10/2014 1327   GFRAA 52 (L) 04/01/2020 0155   GFRAA >60 08/10/2014 1327    GFR Estimated Creatinine Clearance: 46.5 mL/min (A) (by C-G formula based on SCr of 2.13 mg/dL (H)). No results for input(s): LIPASE, AMYLASE in the last 72 hours. No results for input(s): CKTOTAL, CKMB, CKMBINDEX, TROPONINI in the last 72 hours. Invalid input(s): POCBNP No results for input(s): DDIMER in the last 72 hours. No results for input(s): HGBA1C in the last 72 hours. No results for input(s): CHOL, HDL, LDLCALC, TRIG, CHOLHDL, LDLDIRECT in the last 72 hours. No results for input(s): TSH, T4TOTAL, T3FREE, THYROIDAB in the last 72 hours.  Invalid input(s): FREET3 No results for input(s): VITAMINB12, FOLATE, FERRITIN, TIBC, IRON, RETICCTPCT in the last 72 hours. Coags: No results for input(s): INR in the last 72 hours.  Invalid input(s): PT Microbiology: Recent Results (from the past 240 hour(s))  Respiratory Panel by RT PCR (Flu A&B, Covid) - Nasopharyngeal Swab     Status: None   Collection Time: 10/07/20  3:38 PM   Specimen: Nasopharyngeal Swab  Result Value Ref Range Status   SARS Coronavirus 2 by RT PCR NEGATIVE NEGATIVE Final    Comment: (NOTE) SARS-CoV-2 target nucleic acids are NOT DETECTED.  The SARS-CoV-2 RNA is  generally detectable in upper respiratoy specimens during the acute phase of infection. The lowest concentration of SARS-CoV-2 viral copies this assay can detect is 131 copies/mL. A negative result does not preclude SARS-Cov-2 infection and should not be used as the sole basis for treatment or other patient management decisions. A negative result may occur with  improper specimen collection/handling, submission of specimen other than nasopharyngeal swab, presence of viral mutation(s) within the areas targeted by this assay, and inadequate number of viral copies (<131 copies/mL). A negative result must be combined with clinical observations, patient history, and epidemiological information. The expected result is Negative.  Fact Sheet for Patients:  https://www.moore.com/  Fact Sheet for Healthcare Providers:  https://www.young.biz/  This test is no t yet approved or cleared by the [place] FDA and  has been authorized for detection and/or diagnosis of SARS-CoV-2 by FDA under an Emergency Use Authorization (EUA). This EUA will remain  in effect (meaning this test can be used) for the duration of the COVID-19 declaration under Section 564(b)(1) of the Act, 21 U.S.C. section 360bbb-3(b)(1), unless the authorization is terminated or revoked sooner.     Influenza A by PCR NEGATIVE NEGATIVE Final   Influenza  B by PCR NEGATIVE NEGATIVE Final    Comment: (NOTE) The Xpert Xpress SARS-CoV-2/FLU/RSV assay is intended as an aid in  the diagnosis of influenza from Nasopharyngeal swab specimens and  should not be used as a sole basis for treatment. Nasal washings and  aspirates are unacceptable for Xpert Xpress SARS-CoV-2/FLU/RSV  testing.  Fact Sheet for Patients: https://www.moore.com/  Fact Sheet for Healthcare Providers: https://www.young.biz/  This test is not yet approved or cleared by the [place] FDA and  has been authorized for detection and/or diagnosis of SARS-CoV-2 by  FDA under an Emergency Use Authorization (EUA). This EUA will remain  in effect (meaning this test can be used) for the duration of the  Covid-19 declaration under Section 564(b)(1) of the Act, 21  U.S.C. section 360bbb-3(b)(1), unless the authorization is  terminated or revoked. Performed at [facility], [address] [place], [place] [address]   Respiratory Panel by RT PCR (Flu A&B, Covid) - Nasopharyngeal Swab     Status: None   Collection Time: 10/10/20 12:36 PM   Specimen: Nasopharyngeal Swab  Result Value Ref Range Status   SARS Coronavirus 2 by RT PCR NEGATIVE NEGATIVE Final    Comment: (NOTE) SARS-CoV-2 target nucleic acids are NOT DETECTED.  The SARS-CoV-2 RNA is generally detectable in upper respiratoy specimens during the acute phase of infection. The lowest concentration of SARS-CoV-2 viral copies this assay can detect is 131 copies/mL. A negative result does not preclude SARS-Cov-2 infection and should not be used as the sole basis for treatment or other patient management decisions. A negative result may occur with  improper specimen collection/handling, submission of specimen other than nasopharyngeal swab, presence of viral mutation(s) within the areas targeted by this assay, and inadequate number of viral copies (<131 copies/mL). A negative result must be combined with clinical observations, patient history, and epidemiological information. The expected result is Negative.  Fact Sheet for Patients:  https://www.moore.com/  Fact Sheet for Healthcare Providers:  https://www.young.biz/  This test is no t yet approved or cleared by the [place] FDA and  has been authorized for detection and/or diagnosis of SARS-CoV-2 by FDA under an Emergency Use Authorization (EUA). This EUA will remain  in effect (meaning this test can be used)  for the duration of the COVID-19 declaration under Section 564(b)(1) of the Act, 21 U.S.C. section 360bbb-3(b)(1), unless the authorization is terminated or revoked sooner.     Influenza A by PCR NEGATIVE NEGATIVE Final   Influenza B by PCR NEGATIVE NEGATIVE Final    Comment: (NOTE) The Xpert Xpress SARS-CoV-2/FLU/RSV assay is intended as an aid in  the diagnosis of influenza from Nasopharyngeal swab specimens and  should not be used as a sole basis for treatment. Nasal washings and  aspirates are unacceptable for Xpert Xpress SARS-CoV-2/FLU/RSV  testing.  Fact Sheet for Patients: https://www.moore.com/  Fact Sheet for Healthcare Providers: https://www.young.biz/  This test is not yet approved or cleared by the [place] FDA and  has been authorized for detection and/or diagnosis of SARS-CoV-2 by  FDA under an Emergency Use Authorization (EUA). This EUA will remain  in effect (meaning this test can be used) for the duration of the  Covid-19 declaration under Section 564(b)(1) of the Act, 21  U.S.C. section 360bbb-3(b)(1), unless the authorization is  terminated or revoked. Performed at [facility], [address], [place], [place] [address]   Respiratory Panel by RT PCR (Flu A&B, Covid) - Nasopharyngeal Swab  Status: None   Collection Time: 10/13/20  1:34 PM   Specimen: Nasopharyngeal Swab  Result Value Ref Range Status   SARS Coronavirus 2 by RT PCR NEGATIVE NEGATIVE Final    Comment: (NOTE) SARS-CoV-2 target nucleic acids are NOT DETECTED.  The SARS-CoV-2 RNA is generally detectable in upper respiratoy specimens during the acute phase of infection. The lowest concentration of SARS-CoV-2 viral copies this assay can detect is 131 copies/mL. A negative result does not preclude SARS-Cov-2 infection and should not be used as the sole basis for treatment or other patient management decisions. A negative result may occur with    improper specimen collection/handling, submission of specimen other than nasopharyngeal swab, presence of viral mutation(s) within the areas targeted by this assay, and inadequate number of viral copies (<131 copies/mL). A negative result must be combined with clinical observations, patient history, and epidemiological information. The expected result is Negative.  Fact Sheet for Patients:  https://www.moore.com/  Fact Sheet for Healthcare Providers:  https://www.young.biz/  This test is no t yet approved or cleared by the [place] FDA and  has been authorized for detection and/or diagnosis of SARS-CoV-2 by FDA under an Emergency Use Authorization (EUA). This EUA will remain  in effect (meaning this test can be used) for the duration of the COVID-19 declaration under Section 564(b)(1) of the Act, 21 U.S.C. section 360bbb-3(b)(1), unless the authorization is terminated or revoked sooner.     Influenza A by PCR NEGATIVE NEGATIVE Final   Influenza B by PCR NEGATIVE NEGATIVE Final    Comment: (NOTE) The Xpert Xpress SARS-CoV-2/FLU/RSV assay is intended as an aid in  the diagnosis of influenza from Nasopharyngeal swab specimens and  should not be used as a sole basis for treatment. Nasal washings and  aspirates are unacceptable for Xpert Xpress SARS-CoV-2/FLU/RSV  testing.  Fact Sheet for Patients: https://www.moore.com/  Fact Sheet for Healthcare Providers: https://www.young.biz/  This test is not yet approved or cleared by the [place] FDA and  has been authorized for detection and/or diagnosis of SARS-CoV-2 by  FDA under an Emergency Use Authorization (EUA). This EUA will remain  in effect (meaning this test can be used) for the duration of the  Covid-19 declaration under Section 564(b)(1) of the Act, 21  U.S.C. section 360bbb-3(b)(1), unless the authorization is  terminated or  revoked. Performed at [facility], [address] [place], [place] [address]     FURTHER DISCHARGE INSTRUCTIONS:  Get Medicines reviewed and adjusted: Please take all your medications with you for your next visit with your Primary MD  Laboratory/radiological data: Please request your Primary MD to go over all hospital tests and procedure/radiological results at the follow up, please ask your Primary MD to get all Hospital records sent to his/her office.  In some cases, they will be blood work, cultures and biopsy results pending at the time of your discharge. Please request that your primary care M.D. goes through all the records of your hospital data and follows up on these results.  Also Note the following: If you experience worsening of your admission symptoms, develop shortness of breath, life threatening emergency, suicidal or homicidal thoughts you must seek medical attention immediately by calling 911 or calling your MD immediately  if symptoms less severe.  You must read complete instructions/literature along with all the possible adverse reactions/side effects for all the Medicines you take and that have been prescribed to you. Take any new Medicines after you have completely understood and accpet  all the possible adverse reactions/side effects.   Do not drive when taking Pain medications or sleeping medications (Benzodaizepines)  Do not take more than prescribed Pain, Sleep and Anxiety Medications. It is not advisable to combine anxiety,sleep and pain medications without talking with your primary care practitioner  Special Instructions: If you have smoked or chewed Tobacco  in the last 2 yrs please stop smoking, stop any regular Alcohol  and or any Recreational drug use.  Wear Seat belts while driving.  Please note: You were cared for by a hospitalist during your hospital stay. Once you are discharged, your primary care physician will handle any further medical issues.  Please note that NO REFILLS for any discharge medications will be authorized once you are discharged, as it is imperative that you return to your primary care physician (or establish a relationship with a primary care physician if you do not have one) for your post hospital discharge needs so that they can reassess your need for medications and monitor your lab values.  Total Time spent coordinating discharge including counseling, education and face to face time equals 35 minutes.  SignedJeoffrey Massed 10/16/2020 9:29 AM

## 2020-10-16 NOTE — Progress Notes (Signed)
Report called to [place] at [facility], [place].  All questions answered.  All belongings packed up including his cell phone.  Awaiting [facility] for transportation.

## 2020-10-20 DIAGNOSIS — E1169 Type 2 diabetes mellitus with other specified complication: Secondary | ICD-10-CM | POA: Diagnosis not present

## 2020-10-20 DIAGNOSIS — R4701 Aphasia: Secondary | ICD-10-CM | POA: Diagnosis not present

## 2020-10-20 DIAGNOSIS — E1022 Type 1 diabetes mellitus with diabetic chronic kidney disease: Secondary | ICD-10-CM | POA: Diagnosis not present

## 2020-10-20 DIAGNOSIS — E039 Hypothyroidism, unspecified: Secondary | ICD-10-CM | POA: Diagnosis not present

## 2020-10-20 DIAGNOSIS — I639 Cerebral infarction, unspecified: Secondary | ICD-10-CM | POA: Diagnosis not present

## 2020-10-20 DIAGNOSIS — I10 Essential (primary) hypertension: Secondary | ICD-10-CM | POA: Diagnosis not present

## 2020-10-20 DIAGNOSIS — N183 Chronic kidney disease, stage 3 unspecified: Secondary | ICD-10-CM | POA: Diagnosis not present

## 2020-10-20 DIAGNOSIS — N179 Acute kidney failure, unspecified: Secondary | ICD-10-CM | POA: Diagnosis not present

## 2020-10-21 DIAGNOSIS — I679 Cerebrovascular disease, unspecified: Secondary | ICD-10-CM | POA: Diagnosis not present

## 2020-10-28 ENCOUNTER — Ambulatory Visit: Payer: Medicare PPO | Admitting: Podiatry

## 2020-10-31 DIAGNOSIS — I480 Paroxysmal atrial fibrillation: Secondary | ICD-10-CM | POA: Diagnosis not present

## 2020-10-31 DIAGNOSIS — N179 Acute kidney failure, unspecified: Secondary | ICD-10-CM | POA: Diagnosis not present

## 2020-10-31 DIAGNOSIS — E43 Unspecified severe protein-calorie malnutrition: Secondary | ICD-10-CM | POA: Diagnosis not present

## 2020-10-31 DIAGNOSIS — F319 Bipolar disorder, unspecified: Secondary | ICD-10-CM | POA: Diagnosis not present

## 2020-10-31 DIAGNOSIS — I2782 Chronic pulmonary embolism: Secondary | ICD-10-CM | POA: Diagnosis not present

## 2020-10-31 DIAGNOSIS — R5381 Other malaise: Secondary | ICD-10-CM | POA: Diagnosis not present

## 2020-10-31 DIAGNOSIS — I10 Essential (primary) hypertension: Secondary | ICD-10-CM | POA: Diagnosis not present

## 2020-10-31 DIAGNOSIS — E139 Other specified diabetes mellitus without complications: Secondary | ICD-10-CM | POA: Diagnosis not present

## 2020-11-06 DIAGNOSIS — Z6841 Body Mass Index (BMI) 40.0 and over, adult: Secondary | ICD-10-CM | POA: Diagnosis not present

## 2020-11-06 DIAGNOSIS — G459 Transient cerebral ischemic attack, unspecified: Secondary | ICD-10-CM | POA: Diagnosis not present

## 2020-11-06 DIAGNOSIS — N183 Chronic kidney disease, stage 3 unspecified: Secondary | ICD-10-CM | POA: Diagnosis not present

## 2020-11-06 DIAGNOSIS — I130 Hypertensive heart and chronic kidney disease with heart failure and stage 1 through stage 4 chronic kidney disease, or unspecified chronic kidney disease: Secondary | ICD-10-CM | POA: Diagnosis not present

## 2020-11-06 DIAGNOSIS — E1122 Type 2 diabetes mellitus with diabetic chronic kidney disease: Secondary | ICD-10-CM | POA: Diagnosis not present

## 2020-11-06 DIAGNOSIS — Z86711 Personal history of pulmonary embolism: Secondary | ICD-10-CM | POA: Diagnosis not present

## 2020-11-06 DIAGNOSIS — I509 Heart failure, unspecified: Secondary | ICD-10-CM | POA: Diagnosis not present

## 2020-11-06 DIAGNOSIS — I4891 Unspecified atrial fibrillation: Secondary | ICD-10-CM | POA: Diagnosis not present

## 2020-11-06 DIAGNOSIS — G9341 Metabolic encephalopathy: Secondary | ICD-10-CM | POA: Diagnosis not present

## 2020-11-11 DIAGNOSIS — G459 Transient cerebral ischemic attack, unspecified: Secondary | ICD-10-CM | POA: Diagnosis not present

## 2020-11-11 DIAGNOSIS — I4891 Unspecified atrial fibrillation: Secondary | ICD-10-CM | POA: Diagnosis not present

## 2020-11-11 DIAGNOSIS — N183 Chronic kidney disease, stage 3 unspecified: Secondary | ICD-10-CM | POA: Diagnosis not present

## 2020-11-11 DIAGNOSIS — I509 Heart failure, unspecified: Secondary | ICD-10-CM | POA: Diagnosis not present

## 2020-11-11 DIAGNOSIS — G9341 Metabolic encephalopathy: Secondary | ICD-10-CM | POA: Diagnosis not present

## 2020-11-11 DIAGNOSIS — Z86711 Personal history of pulmonary embolism: Secondary | ICD-10-CM | POA: Diagnosis not present

## 2020-11-11 DIAGNOSIS — E1122 Type 2 diabetes mellitus with diabetic chronic kidney disease: Secondary | ICD-10-CM | POA: Diagnosis not present

## 2020-11-11 DIAGNOSIS — I130 Hypertensive heart and chronic kidney disease with heart failure and stage 1 through stage 4 chronic kidney disease, or unspecified chronic kidney disease: Secondary | ICD-10-CM | POA: Diagnosis not present

## 2020-11-11 DIAGNOSIS — Z6841 Body Mass Index (BMI) 40.0 and over, adult: Secondary | ICD-10-CM | POA: Diagnosis not present

## 2020-11-13 DIAGNOSIS — E1122 Type 2 diabetes mellitus with diabetic chronic kidney disease: Secondary | ICD-10-CM | POA: Diagnosis not present

## 2020-11-13 DIAGNOSIS — I4891 Unspecified atrial fibrillation: Secondary | ICD-10-CM | POA: Diagnosis not present

## 2020-11-13 DIAGNOSIS — Z86711 Personal history of pulmonary embolism: Secondary | ICD-10-CM | POA: Diagnosis not present

## 2020-11-13 DIAGNOSIS — N183 Chronic kidney disease, stage 3 unspecified: Secondary | ICD-10-CM | POA: Diagnosis not present

## 2020-11-13 DIAGNOSIS — I509 Heart failure, unspecified: Secondary | ICD-10-CM | POA: Diagnosis not present

## 2020-11-13 DIAGNOSIS — G459 Transient cerebral ischemic attack, unspecified: Secondary | ICD-10-CM | POA: Diagnosis not present

## 2020-11-13 DIAGNOSIS — G9341 Metabolic encephalopathy: Secondary | ICD-10-CM | POA: Diagnosis not present

## 2020-11-13 DIAGNOSIS — I130 Hypertensive heart and chronic kidney disease with heart failure and stage 1 through stage 4 chronic kidney disease, or unspecified chronic kidney disease: Secondary | ICD-10-CM | POA: Diagnosis not present

## 2020-11-13 DIAGNOSIS — Z6841 Body Mass Index (BMI) 40.0 and over, adult: Secondary | ICD-10-CM | POA: Diagnosis not present

## 2020-11-18 DIAGNOSIS — G9341 Metabolic encephalopathy: Secondary | ICD-10-CM | POA: Diagnosis not present

## 2020-11-18 DIAGNOSIS — G459 Transient cerebral ischemic attack, unspecified: Secondary | ICD-10-CM | POA: Diagnosis not present

## 2020-11-18 DIAGNOSIS — Z86711 Personal history of pulmonary embolism: Secondary | ICD-10-CM | POA: Diagnosis not present

## 2020-11-18 DIAGNOSIS — E1122 Type 2 diabetes mellitus with diabetic chronic kidney disease: Secondary | ICD-10-CM | POA: Diagnosis not present

## 2020-11-18 DIAGNOSIS — I509 Heart failure, unspecified: Secondary | ICD-10-CM | POA: Diagnosis not present

## 2020-11-18 DIAGNOSIS — I4891 Unspecified atrial fibrillation: Secondary | ICD-10-CM | POA: Diagnosis not present

## 2020-11-18 DIAGNOSIS — Z6841 Body Mass Index (BMI) 40.0 and over, adult: Secondary | ICD-10-CM | POA: Diagnosis not present

## 2020-11-18 DIAGNOSIS — I130 Hypertensive heart and chronic kidney disease with heart failure and stage 1 through stage 4 chronic kidney disease, or unspecified chronic kidney disease: Secondary | ICD-10-CM | POA: Diagnosis not present

## 2020-11-18 DIAGNOSIS — N183 Chronic kidney disease, stage 3 unspecified: Secondary | ICD-10-CM | POA: Diagnosis not present

## 2020-11-19 DIAGNOSIS — I130 Hypertensive heart and chronic kidney disease with heart failure and stage 1 through stage 4 chronic kidney disease, or unspecified chronic kidney disease: Secondary | ICD-10-CM | POA: Diagnosis not present

## 2020-11-19 DIAGNOSIS — E1122 Type 2 diabetes mellitus with diabetic chronic kidney disease: Secondary | ICD-10-CM | POA: Diagnosis not present

## 2020-11-19 DIAGNOSIS — I509 Heart failure, unspecified: Secondary | ICD-10-CM | POA: Diagnosis not present

## 2020-11-19 DIAGNOSIS — Z6841 Body Mass Index (BMI) 40.0 and over, adult: Secondary | ICD-10-CM | POA: Diagnosis not present

## 2020-11-19 DIAGNOSIS — I4891 Unspecified atrial fibrillation: Secondary | ICD-10-CM | POA: Diagnosis not present

## 2020-11-19 DIAGNOSIS — G459 Transient cerebral ischemic attack, unspecified: Secondary | ICD-10-CM | POA: Diagnosis not present

## 2020-11-19 DIAGNOSIS — G9341 Metabolic encephalopathy: Secondary | ICD-10-CM | POA: Diagnosis not present

## 2020-11-19 DIAGNOSIS — N183 Chronic kidney disease, stage 3 unspecified: Secondary | ICD-10-CM | POA: Diagnosis not present

## 2020-11-19 DIAGNOSIS — Z86711 Personal history of pulmonary embolism: Secondary | ICD-10-CM | POA: Diagnosis not present

## 2020-11-25 DIAGNOSIS — N183 Chronic kidney disease, stage 3 unspecified: Secondary | ICD-10-CM | POA: Diagnosis not present

## 2020-11-25 DIAGNOSIS — G459 Transient cerebral ischemic attack, unspecified: Secondary | ICD-10-CM | POA: Diagnosis not present

## 2020-11-25 DIAGNOSIS — E1122 Type 2 diabetes mellitus with diabetic chronic kidney disease: Secondary | ICD-10-CM | POA: Diagnosis not present

## 2020-11-25 DIAGNOSIS — Z6841 Body Mass Index (BMI) 40.0 and over, adult: Secondary | ICD-10-CM | POA: Diagnosis not present

## 2020-11-25 DIAGNOSIS — I4891 Unspecified atrial fibrillation: Secondary | ICD-10-CM | POA: Diagnosis not present

## 2020-11-25 DIAGNOSIS — Z86711 Personal history of pulmonary embolism: Secondary | ICD-10-CM | POA: Diagnosis not present

## 2020-11-25 DIAGNOSIS — I509 Heart failure, unspecified: Secondary | ICD-10-CM | POA: Diagnosis not present

## 2020-11-25 DIAGNOSIS — G9341 Metabolic encephalopathy: Secondary | ICD-10-CM | POA: Diagnosis not present

## 2020-11-25 DIAGNOSIS — I130 Hypertensive heart and chronic kidney disease with heart failure and stage 1 through stage 4 chronic kidney disease, or unspecified chronic kidney disease: Secondary | ICD-10-CM | POA: Diagnosis not present

## 2020-12-06 DIAGNOSIS — I509 Heart failure, unspecified: Secondary | ICD-10-CM | POA: Diagnosis not present

## 2020-12-06 DIAGNOSIS — Z86711 Personal history of pulmonary embolism: Secondary | ICD-10-CM | POA: Diagnosis not present

## 2020-12-06 DIAGNOSIS — G459 Transient cerebral ischemic attack, unspecified: Secondary | ICD-10-CM | POA: Diagnosis not present

## 2020-12-06 DIAGNOSIS — G9341 Metabolic encephalopathy: Secondary | ICD-10-CM | POA: Diagnosis not present

## 2020-12-06 DIAGNOSIS — Z6841 Body Mass Index (BMI) 40.0 and over, adult: Secondary | ICD-10-CM | POA: Diagnosis not present

## 2020-12-06 DIAGNOSIS — I130 Hypertensive heart and chronic kidney disease with heart failure and stage 1 through stage 4 chronic kidney disease, or unspecified chronic kidney disease: Secondary | ICD-10-CM | POA: Diagnosis not present

## 2020-12-06 DIAGNOSIS — E1122 Type 2 diabetes mellitus with diabetic chronic kidney disease: Secondary | ICD-10-CM | POA: Diagnosis not present

## 2020-12-06 DIAGNOSIS — N183 Chronic kidney disease, stage 3 unspecified: Secondary | ICD-10-CM | POA: Diagnosis not present

## 2020-12-06 DIAGNOSIS — I4891 Unspecified atrial fibrillation: Secondary | ICD-10-CM | POA: Diagnosis not present

## 2020-12-08 DIAGNOSIS — N183 Chronic kidney disease, stage 3 unspecified: Secondary | ICD-10-CM | POA: Diagnosis not present

## 2020-12-08 DIAGNOSIS — I959 Hypotension, unspecified: Secondary | ICD-10-CM | POA: Diagnosis not present

## 2020-12-08 DIAGNOSIS — I129 Hypertensive chronic kidney disease with stage 1 through stage 4 chronic kidney disease, or unspecified chronic kidney disease: Secondary | ICD-10-CM | POA: Diagnosis not present

## 2020-12-08 DIAGNOSIS — E1122 Type 2 diabetes mellitus with diabetic chronic kidney disease: Secondary | ICD-10-CM | POA: Diagnosis not present

## 2020-12-08 DIAGNOSIS — Z794 Long term (current) use of insulin: Secondary | ICD-10-CM | POA: Diagnosis not present

## 2020-12-08 DIAGNOSIS — D631 Anemia in chronic kidney disease: Secondary | ICD-10-CM | POA: Diagnosis not present

## 2020-12-09 DIAGNOSIS — E039 Hypothyroidism, unspecified: Secondary | ICD-10-CM | POA: Diagnosis not present

## 2020-12-09 DIAGNOSIS — I509 Heart failure, unspecified: Secondary | ICD-10-CM | POA: Diagnosis not present

## 2020-12-09 DIAGNOSIS — I480 Paroxysmal atrial fibrillation: Secondary | ICD-10-CM | POA: Diagnosis not present

## 2020-12-09 DIAGNOSIS — N1832 Chronic kidney disease, stage 3b: Secondary | ICD-10-CM | POA: Diagnosis not present

## 2020-12-09 DIAGNOSIS — I110 Hypertensive heart disease with heart failure: Secondary | ICD-10-CM | POA: Diagnosis not present

## 2020-12-09 DIAGNOSIS — I739 Peripheral vascular disease, unspecified: Secondary | ICD-10-CM | POA: Diagnosis not present

## 2020-12-09 DIAGNOSIS — E1122 Type 2 diabetes mellitus with diabetic chronic kidney disease: Secondary | ICD-10-CM | POA: Diagnosis not present

## 2020-12-09 DIAGNOSIS — Z7984 Long term (current) use of oral hypoglycemic drugs: Secondary | ICD-10-CM | POA: Diagnosis not present

## 2020-12-10 DIAGNOSIS — Z7984 Long term (current) use of oral hypoglycemic drugs: Secondary | ICD-10-CM | POA: Diagnosis not present

## 2020-12-10 DIAGNOSIS — E039 Hypothyroidism, unspecified: Secondary | ICD-10-CM | POA: Diagnosis not present

## 2020-12-10 DIAGNOSIS — I110 Hypertensive heart disease with heart failure: Secondary | ICD-10-CM | POA: Diagnosis not present

## 2020-12-10 DIAGNOSIS — E1122 Type 2 diabetes mellitus with diabetic chronic kidney disease: Secondary | ICD-10-CM | POA: Diagnosis not present

## 2020-12-12 DIAGNOSIS — I509 Heart failure, unspecified: Secondary | ICD-10-CM | POA: Diagnosis not present

## 2020-12-12 DIAGNOSIS — N183 Chronic kidney disease, stage 3 unspecified: Secondary | ICD-10-CM | POA: Diagnosis not present

## 2020-12-12 DIAGNOSIS — Z86711 Personal history of pulmonary embolism: Secondary | ICD-10-CM | POA: Diagnosis not present

## 2020-12-12 DIAGNOSIS — Z6841 Body Mass Index (BMI) 40.0 and over, adult: Secondary | ICD-10-CM | POA: Diagnosis not present

## 2020-12-12 DIAGNOSIS — I4891 Unspecified atrial fibrillation: Secondary | ICD-10-CM | POA: Diagnosis not present

## 2020-12-12 DIAGNOSIS — G459 Transient cerebral ischemic attack, unspecified: Secondary | ICD-10-CM | POA: Diagnosis not present

## 2020-12-12 DIAGNOSIS — E1122 Type 2 diabetes mellitus with diabetic chronic kidney disease: Secondary | ICD-10-CM | POA: Diagnosis not present

## 2020-12-12 DIAGNOSIS — G9341 Metabolic encephalopathy: Secondary | ICD-10-CM | POA: Diagnosis not present

## 2020-12-12 DIAGNOSIS — I130 Hypertensive heart and chronic kidney disease with heart failure and stage 1 through stage 4 chronic kidney disease, or unspecified chronic kidney disease: Secondary | ICD-10-CM | POA: Diagnosis not present

## 2020-12-19 DIAGNOSIS — N183 Chronic kidney disease, stage 3 unspecified: Secondary | ICD-10-CM | POA: Diagnosis not present

## 2020-12-19 DIAGNOSIS — G459 Transient cerebral ischemic attack, unspecified: Secondary | ICD-10-CM | POA: Diagnosis not present

## 2020-12-19 DIAGNOSIS — G9341 Metabolic encephalopathy: Secondary | ICD-10-CM | POA: Diagnosis not present

## 2020-12-19 DIAGNOSIS — E1122 Type 2 diabetes mellitus with diabetic chronic kidney disease: Secondary | ICD-10-CM | POA: Diagnosis not present

## 2020-12-19 DIAGNOSIS — Z6841 Body Mass Index (BMI) 40.0 and over, adult: Secondary | ICD-10-CM | POA: Diagnosis not present

## 2020-12-19 DIAGNOSIS — I509 Heart failure, unspecified: Secondary | ICD-10-CM | POA: Diagnosis not present

## 2020-12-19 DIAGNOSIS — Z86711 Personal history of pulmonary embolism: Secondary | ICD-10-CM | POA: Diagnosis not present

## 2020-12-19 DIAGNOSIS — I4891 Unspecified atrial fibrillation: Secondary | ICD-10-CM | POA: Diagnosis not present

## 2020-12-19 DIAGNOSIS — I130 Hypertensive heart and chronic kidney disease with heart failure and stage 1 through stage 4 chronic kidney disease, or unspecified chronic kidney disease: Secondary | ICD-10-CM | POA: Diagnosis not present

## 2021-01-08 DIAGNOSIS — E782 Mixed hyperlipidemia: Secondary | ICD-10-CM | POA: Diagnosis not present

## 2021-01-08 DIAGNOSIS — G4730 Sleep apnea, unspecified: Secondary | ICD-10-CM | POA: Diagnosis not present

## 2021-01-08 DIAGNOSIS — I10 Essential (primary) hypertension: Secondary | ICD-10-CM | POA: Diagnosis not present

## 2021-01-08 DIAGNOSIS — I509 Heart failure, unspecified: Secondary | ICD-10-CM | POA: Diagnosis not present

## 2021-01-08 DIAGNOSIS — R0602 Shortness of breath: Secondary | ICD-10-CM | POA: Diagnosis not present

## 2021-02-07 MED ORDER — BUPIVACAINE HCL (PF) 0.25 % IJ SOLN
INTRAMUSCULAR | Status: AC
  Filled 2021-02-07: qty 30

## 2021-03-16 DIAGNOSIS — R809 Proteinuria, unspecified: Secondary | ICD-10-CM | POA: Diagnosis not present

## 2021-03-16 DIAGNOSIS — N183 Chronic kidney disease, stage 3 unspecified: Secondary | ICD-10-CM | POA: Diagnosis not present

## 2021-03-16 DIAGNOSIS — D649 Anemia, unspecified: Secondary | ICD-10-CM | POA: Diagnosis not present

## 2021-04-01 ENCOUNTER — Emergency Department (HOSPITAL_COMMUNITY): Payer: Medicare PPO

## 2021-04-01 ENCOUNTER — Other Ambulatory Visit: Payer: Self-pay

## 2021-04-01 ENCOUNTER — Inpatient Hospital Stay (HOSPITAL_COMMUNITY)
Admission: EM | Admit: 2021-04-01 | Discharge: 2021-04-06 | DRG: 871 | Disposition: A | Discharge status: Skilled Nursing Facility | Payer: Medicare PPO | Attending: Internal Medicine | Admitting: Internal Medicine

## 2021-04-01 ENCOUNTER — Encounter (HOSPITAL_COMMUNITY): Payer: Self-pay

## 2021-04-01 DIAGNOSIS — R509 Fever, unspecified: Secondary | ICD-10-CM | POA: Diagnosis present

## 2021-04-01 DIAGNOSIS — I10 Essential (primary) hypertension: Secondary | ICD-10-CM | POA: Diagnosis not present

## 2021-04-01 DIAGNOSIS — I9589 Other hypotension: Secondary | ICD-10-CM | POA: Diagnosis not present

## 2021-04-01 DIAGNOSIS — I2510 Atherosclerotic heart disease of native coronary artery without angina pectoris: Secondary | ICD-10-CM | POA: Diagnosis present

## 2021-04-01 DIAGNOSIS — E875 Hyperkalemia: Secondary | ICD-10-CM | POA: Diagnosis present

## 2021-04-01 DIAGNOSIS — Z7902 Long term (current) use of antithrombotics/antiplatelets: Secondary | ICD-10-CM

## 2021-04-01 DIAGNOSIS — Z9861 Coronary angioplasty status: Secondary | ICD-10-CM

## 2021-04-01 DIAGNOSIS — I480 Paroxysmal atrial fibrillation: Secondary | ICD-10-CM | POA: Diagnosis present

## 2021-04-01 DIAGNOSIS — I959 Hypotension, unspecified: Secondary | ICD-10-CM | POA: Diagnosis not present

## 2021-04-01 DIAGNOSIS — F3131 Bipolar disorder, current episode depressed, mild: Secondary | ICD-10-CM | POA: Diagnosis not present

## 2021-04-01 DIAGNOSIS — R0689 Other abnormalities of breathing: Secondary | ICD-10-CM | POA: Diagnosis not present

## 2021-04-01 DIAGNOSIS — Z86711 Personal history of pulmonary embolism: Secondary | ICD-10-CM | POA: Diagnosis not present

## 2021-04-01 DIAGNOSIS — I708 Atherosclerosis of other arteries: Secondary | ICD-10-CM | POA: Diagnosis not present

## 2021-04-01 DIAGNOSIS — N1832 Chronic kidney disease, stage 3b: Secondary | ICD-10-CM | POA: Diagnosis present

## 2021-04-01 DIAGNOSIS — I5033 Acute on chronic diastolic (congestive) heart failure: Secondary | ICD-10-CM | POA: Diagnosis present

## 2021-04-01 DIAGNOSIS — Z9114 Patient's other noncompliance with medication regimen: Secondary | ICD-10-CM | POA: Diagnosis not present

## 2021-04-01 DIAGNOSIS — R0902 Hypoxemia: Secondary | ICD-10-CM | POA: Diagnosis not present

## 2021-04-01 DIAGNOSIS — A419 Sepsis, unspecified organism: Secondary | ICD-10-CM | POA: Diagnosis present

## 2021-04-01 DIAGNOSIS — E669 Obesity, unspecified: Secondary | ICD-10-CM | POA: Diagnosis present

## 2021-04-01 DIAGNOSIS — R5381 Other malaise: Secondary | ICD-10-CM | POA: Diagnosis not present

## 2021-04-01 DIAGNOSIS — J9811 Atelectasis: Secondary | ICD-10-CM | POA: Diagnosis not present

## 2021-04-01 DIAGNOSIS — E039 Hypothyroidism, unspecified: Secondary | ICD-10-CM | POA: Diagnosis present

## 2021-04-01 DIAGNOSIS — G4733 Obstructive sleep apnea (adult) (pediatric): Secondary | ICD-10-CM | POA: Diagnosis present

## 2021-04-01 DIAGNOSIS — Z20822 Contact with and (suspected) exposure to covid-19: Secondary | ICD-10-CM | POA: Diagnosis present

## 2021-04-01 DIAGNOSIS — I252 Old myocardial infarction: Secondary | ICD-10-CM

## 2021-04-01 DIAGNOSIS — R300 Dysuria: Secondary | ICD-10-CM | POA: Diagnosis not present

## 2021-04-01 DIAGNOSIS — Z86718 Personal history of other venous thrombosis and embolism: Secondary | ICD-10-CM | POA: Diagnosis not present

## 2021-04-01 DIAGNOSIS — R000 Tachycardia, unspecified: Secondary | ICD-10-CM | POA: Diagnosis not present

## 2021-04-01 DIAGNOSIS — E871 Hypo-osmolality and hyponatremia: Secondary | ICD-10-CM | POA: Diagnosis present

## 2021-04-01 DIAGNOSIS — N179 Acute kidney failure, unspecified: Secondary | ICD-10-CM | POA: Diagnosis present

## 2021-04-01 DIAGNOSIS — E118 Type 2 diabetes mellitus with unspecified complications: Secondary | ICD-10-CM | POA: Diagnosis present

## 2021-04-01 DIAGNOSIS — I130 Hypertensive heart and chronic kidney disease with heart failure and stage 1 through stage 4 chronic kidney disease, or unspecified chronic kidney disease: Secondary | ICD-10-CM | POA: Diagnosis present

## 2021-04-01 DIAGNOSIS — E872 Acidosis, unspecified: Secondary | ICD-10-CM | POA: Diagnosis present

## 2021-04-01 DIAGNOSIS — R6521 Severe sepsis with septic shock: Secondary | ICD-10-CM | POA: Diagnosis present

## 2021-04-01 DIAGNOSIS — M6281 Muscle weakness (generalized): Secondary | ICD-10-CM | POA: Diagnosis not present

## 2021-04-01 DIAGNOSIS — Z888 Allergy status to other drugs, medicaments and biological substances status: Secondary | ICD-10-CM

## 2021-04-01 DIAGNOSIS — E1122 Type 2 diabetes mellitus with diabetic chronic kidney disease: Secondary | ICD-10-CM | POA: Diagnosis present

## 2021-04-01 DIAGNOSIS — Z794 Long term (current) use of insulin: Secondary | ICD-10-CM

## 2021-04-01 DIAGNOSIS — R279 Unspecified lack of coordination: Secondary | ICD-10-CM | POA: Diagnosis not present

## 2021-04-01 DIAGNOSIS — R404 Transient alteration of awareness: Secondary | ICD-10-CM | POA: Diagnosis not present

## 2021-04-01 DIAGNOSIS — R4182 Altered mental status, unspecified: Secondary | ICD-10-CM | POA: Diagnosis not present

## 2021-04-01 DIAGNOSIS — Z6835 Body mass index (BMI) 35.0-35.9, adult: Secondary | ICD-10-CM

## 2021-04-01 DIAGNOSIS — Z87440 Personal history of urinary (tract) infections: Secondary | ICD-10-CM

## 2021-04-01 DIAGNOSIS — F319 Bipolar disorder, unspecified: Secondary | ICD-10-CM | POA: Diagnosis present

## 2021-04-01 DIAGNOSIS — N1831 Chronic kidney disease, stage 3a: Secondary | ICD-10-CM

## 2021-04-01 DIAGNOSIS — R262 Difficulty in walking, not elsewhere classified: Secondary | ICD-10-CM | POA: Diagnosis not present

## 2021-04-01 DIAGNOSIS — Z79899 Other long term (current) drug therapy: Secondary | ICD-10-CM

## 2021-04-01 DIAGNOSIS — Z833 Family history of diabetes mellitus: Secondary | ICD-10-CM

## 2021-04-01 DIAGNOSIS — R6520 Severe sepsis without septic shock: Secondary | ICD-10-CM | POA: Diagnosis not present

## 2021-04-01 DIAGNOSIS — Z8249 Family history of ischemic heart disease and other diseases of the circulatory system: Secondary | ICD-10-CM

## 2021-04-01 DIAGNOSIS — Z7901 Long term (current) use of anticoagulants: Secondary | ICD-10-CM

## 2021-04-01 DIAGNOSIS — R410 Disorientation, unspecified: Secondary | ICD-10-CM | POA: Diagnosis not present

## 2021-04-01 DIAGNOSIS — G9341 Metabolic encephalopathy: Secondary | ICD-10-CM | POA: Diagnosis present

## 2021-04-01 DIAGNOSIS — E785 Hyperlipidemia, unspecified: Secondary | ICD-10-CM | POA: Diagnosis present

## 2021-04-01 DIAGNOSIS — M109 Gout, unspecified: Secondary | ICD-10-CM | POA: Diagnosis present

## 2021-04-01 DIAGNOSIS — N281 Cyst of kidney, acquired: Secondary | ICD-10-CM | POA: Diagnosis not present

## 2021-04-01 DIAGNOSIS — I5031 Acute diastolic (congestive) heart failure: Secondary | ICD-10-CM | POA: Diagnosis not present

## 2021-04-01 DIAGNOSIS — Z7984 Long term (current) use of oral hypoglycemic drugs: Secondary | ICD-10-CM

## 2021-04-01 DIAGNOSIS — Z743 Need for continuous supervision: Secondary | ICD-10-CM | POA: Diagnosis not present

## 2021-04-01 LAB — COMPREHENSIVE METABOLIC PANEL
ALT: 38 U/L (ref 0–44)
AST: 36 U/L (ref 15–41)
Albumin: 3.5 g/dL (ref 3.5–5.0)
Alkaline Phosphatase: 67 U/L (ref 38–126)
Anion gap: 11 (ref 5–15)
BUN: 38 mg/dL — ABNORMAL HIGH (ref 8–23)
CO2: 20 mmol/L — ABNORMAL LOW (ref 22–32)
Calcium: 9.1 mg/dL (ref 8.9–10.3)
Chloride: 100 mmol/L (ref 98–111)
Creatinine, Ser: 2.13 mg/dL — ABNORMAL HIGH (ref 0.61–1.24)
GFR, Estimated: 33 mL/min — ABNORMAL LOW (ref >60)
Glucose, Bld: 206 mg/dL — ABNORMAL HIGH (ref 70–99)
Potassium: 5.3 mmol/L — ABNORMAL HIGH (ref 3.5–5.1)
Sodium: 131 mmol/L — ABNORMAL LOW (ref 135–145)
Total Bilirubin: 0.7 mg/dL (ref 0.3–1.2)
Total Protein: 7.5 g/dL (ref 6.5–8.1)

## 2021-04-01 LAB — CBC WITH DIFFERENTIAL/PLATELET
Abs Immature Granulocytes: 0.1 10*3/uL — ABNORMAL HIGH (ref 0.00–0.07)
Basophils Absolute: 0 10*3/uL (ref 0.0–0.1)
Basophils Relative: 0 %
Eosinophils Absolute: 0 10*3/uL (ref 0.0–0.5)
Eosinophils Relative: 0 %
HCT: 49.5 % (ref 39.0–52.0)
Hemoglobin: 14.7 g/dL (ref 13.0–17.0)
Immature Granulocytes: 1 %
Lymphocytes Relative: 3 %
Lymphs Abs: 0.5 10*3/uL — ABNORMAL LOW (ref 0.7–4.0)
MCH: 25.1 pg — ABNORMAL LOW (ref 26.0–34.0)
MCHC: 29.7 g/dL — ABNORMAL LOW (ref 30.0–36.0)
MCV: 84.5 fL (ref 80.0–100.0)
Monocytes Absolute: 1 10*3/uL (ref 0.1–1.0)
Monocytes Relative: 6 %
Neutro Abs: 14.9 10*3/uL — ABNORMAL HIGH (ref 1.7–7.7)
Neutrophils Relative %: 90 %
Platelets: 209 10*3/uL (ref 150–400)
RBC: 5.86 MIL/uL — ABNORMAL HIGH (ref 4.22–5.81)
RDW: 16.4 % — ABNORMAL HIGH (ref 11.5–15.5)
WBC: 16.6 10*3/uL — ABNORMAL HIGH (ref 4.0–10.5)
nRBC: 0 % (ref 0.0–0.2)

## 2021-04-01 LAB — URINALYSIS, ROUTINE W REFLEX MICROSCOPIC
Bilirubin Urine: NEGATIVE
Glucose, UA: >=500 mg/dL — AB
Hgb urine dipstick: NEGATIVE
Ketones, ur: NEGATIVE mg/dL
Leukocytes,Ua: NEGATIVE
Nitrite: NEGATIVE
Protein, ur: NEGATIVE mg/dL
Specific Gravity, Urine: 1.013 (ref 1.005–1.030)
pH: 7 (ref 5.0–8.0)

## 2021-04-01 LAB — RESP PANEL BY RT-PCR (FLU A&B, COVID) ARPGX2
Influenza A by PCR: NEGATIVE
Influenza B by PCR: NEGATIVE
SARS Coronavirus 2 by RT PCR: NEGATIVE

## 2021-04-01 LAB — TROPONIN I (HIGH SENSITIVITY): Troponin I (High Sensitivity): 49 ng/L — ABNORMAL HIGH (ref <18)

## 2021-04-01 LAB — LACTIC ACID, PLASMA
Lactic Acid, Venous: 2.1 mmol/L (ref 0.5–1.9)
Lactic Acid, Venous: 2.6 mmol/L (ref 0.5–1.9)

## 2021-04-01 LAB — PROTIME-INR
INR: 1.6 — ABNORMAL HIGH (ref 0.8–1.2)
Prothrombin Time: 19.1 seconds — ABNORMAL HIGH (ref 11.4–15.2)

## 2021-04-01 MED ORDER — VANCOMYCIN HCL 2000 MG/400ML IV SOLN
2000.0000 mg | Freq: Once | INTRAVENOUS | Status: AC
  Administered 2021-04-02: 2000 mg via INTRAVENOUS
  Filled 2021-04-01: qty 400

## 2021-04-01 MED ORDER — LACTATED RINGERS IV BOLUS
2000.0000 mL | Freq: Once | INTRAVENOUS | Status: AC
  Administered 2021-04-01: 2000 mL via INTRAVENOUS

## 2021-04-01 MED ORDER — SODIUM CHLORIDE 0.9 % IV SOLN: Freq: Once | INTRAVENOUS | Status: AC

## 2021-04-01 MED ORDER — SODIUM CHLORIDE 0.9 % IV SOLN
2.0000 g | Freq: Once | INTRAVENOUS | Status: AC
  Administered 2021-04-01: 2 g via INTRAVENOUS
  Filled 2021-04-01: qty 20

## 2021-04-01 MED ORDER — ACETAMINOPHEN 325 MG PO TABS
650.0000 mg | ORAL_TABLET | Freq: Once | ORAL | Status: AC
  Administered 2021-04-01: 650 mg via ORAL
  Filled 2021-04-01: qty 2

## 2021-04-01 NOTE — ED Provider Notes (Signed)
[facility]-EMERGENCY DEPT Provider Note   CSN: [id number] Arrival date & time: 04/01/21  1816     History Chief Complaint  Patient presents with  . Code Sepsis    Christian [name] is a 70 y.o. male history of type 2 diabetes, NSTEMI s/p stents, chronic kidney disease, presented here with altered mental status.  His daughter reports patient was normal yesterday, up and alert.  Today he was "catatonic" when she arrived.  Patient had reported he wasn't taking all of his medications as prescribed, with many mental health medications missing.  He has a hx of depression and bipolar disorder, but for the past few years he's had "mania more than depression."  He has a history of UTI's in the past.  He has no open wounds.  Today he was "shaking" at home and seemed confused.  The patient reports no chest pain or abdominal pain.  He does report a mild frontal headache, denies neck pain.  HPI     Past Medical History:  Diagnosis Date  . Bilateral lower extremity edema: chronic with venous stasis changes 06/02/2014  . Bipolar 1 disorder (HCC)   . Bipolar disorder (HCC)   . Chronic kidney disease   . Gout   . Gout   . Hx of blood clots   . Hypertension   . Mood swings   . OSA on CPAP 06/02/2014  . Other and unspecified hyperlipidemia 06/02/2014  . Type II or unspecified type diabetes mellitus with unspecified complication, uncontrolled 06/02/2014    Patient Active Problem List   Diagnosis Date Noted  . Altered mental status   . AKI (acute kidney injury) (HCC)   . CVA (cerebral vascular accident) (HCC) 10/07/2020  . Type 2 diabetes mellitus with stage 3 chronic kidney disease (HCC) 04/01/2020  . CAD (coronary artery disease), native coronary artery 04/01/2020  . NSTEMI (non-ST elevated myocardial infarction) (HCC) 03/31/2020  . Obesity, Class III, BMI 40-49.9 (morbid obesity) (HCC) 03/31/2020  . Aspiration pneumonia of both lower lobes due to gastric secretions (HCC)  03/21/2020  . Sacral decubitus ulcer, stage II (HCC) 03/19/2020  . Chronic diastolic CHF (congestive heart failure) (HCC) 03/17/2020  . Atrial flutter, paroxysmal (HCC) 12/27/2019  . Constipation 09/16/2018  . Bipolar 1 disorder, depressed (HCC) 08/22/2018  . Encephalopathy   . Paroxysmal atrial fibrillation (HCC) 06/18/2018  . History of pulmonary embolism 06/17/2018  . Generalized weakness 03/27/2018  . Diabetes mellitus type 2 in nonobese (HCC) 03/27/2018  . Pulmonary embolism (HCC) 03/27/2018  . Fall   . Laceration of eyebrow   . Bipolar I disorder, most recent episode depressed, severe w psychosis (HCC) 12/22/2015  . Bipolar affective disorder, depressed, severe, with psychotic behavior (HCC)   . Bipolar I disorder, current or most recent episode depressed, with psychotic features (HCC)   . Severe bipolar I disorder with depression (HCC)   . Bipolar I disorder, most recent episode depressed, severe with psychotic features (HCC)   . Overdose 09/11/2015  . Acute respiratory failure with hypoxia (HCC) 09/05/2015  . Elevated troponin 09/05/2015  . Somnolence 09/05/2015  . Bipolar depression (HCC) 09/05/2015  . Acute bilateral deep vein thrombosis (DVT) of femoral veins (HCC) 09/05/2015  . Bilateral pulmonary embolism (HCC) 09/02/2015  . Labile hypertension   . Pulmonary embolism with acute cor pulmonale (HCC)   . Dyspnea   . Orthostatic hypotension   . Bipolar disorder, in partial remission, most recent episode depressed (HCC)   . Syncope, near 08/31/2015  .  Affective psychosis, bipolar (HCC)   . Bipolar affective disorder, depressed, mild (HCC) 08/28/2015  . Pressure ulcer 07/30/2015  . Protein-calorie malnutrition, severe (HCC) 07/30/2015  . Dehydration 07/29/2015  . Diabetes (HCC) 07/09/2015  . UTI (urinary tract infection) 06/19/2014  . Positive blood culture 06/19/2014  . Type 2 diabetes mellitus with complication, without long-term current use of insulin (HCC)  06/02/2014  . Other and unspecified hyperlipidemia 06/02/2014  . OSA on CPAP 06/02/2014  . Bilateral lower extremity edema: chronic with venous stasis changes 06/02/2014  . Gout 06/02/2014  . Hypertension     Past Surgical History:  Procedure Laterality Date  . LEFT HEART CATH AND CORONARY ANGIOGRAPHY Left 04/01/2020   Procedure: LEFT HEART CATH AND CORONARY ANGIOGRAPHY;  Surgeon: Laurier Nancy, MD;  Location: [facility] INVASIVE CV LAB;  Service: Cardiovascular;  Laterality: Left;       Family History  Problem Relation Age of Onset  . Dementia Mother   . Arthritis Father   . Hypertension Father   . Mental illness Sister   . Diabetes Sister   . Heart failure Neg Hx   . Kidney failure Neg Hx   . Cancer Neg Hx     Social History   Tobacco Use  . Smoking status: Never Smoker  . Smokeless tobacco: Never Used  Substance Use Topics  . Alcohol use: No  . Drug use: No    Home Medications Prior to Admission medications   Medication Sig Start Date End Date Taking? Authorizing Provider  acetaminophen (TYLENOL) 500 MG tablet Take 500-1,000 mg by mouth every 8 (eight) hours as needed for mild pain or headache.    [provider]  ARIPiprazole (ABILIFY) 20 MG tablet Take 1 tablet (20 mg total) by mouth daily. 02/06/20   Starkes-Perry, Juel Burrow, FNP  atorvastatin (LIPITOR) 10 MG tablet Take 1 tablet (10 mg total) by mouth daily at 6 PM. Patient taking differently: Take 10 mg by mouth daily.  02/05/20   Starkes-Perry, Juel Burrow, FNP  buPROPion (WELLBUTRIN) 75 MG tablet Take 1 tablet (75 mg total) by mouth daily. 02/06/20   Maryagnes Amos, FNP  clopidogrel (PLAVIX) 75 MG tablet Take 75 mg by mouth daily.  04/30/20   [provider]  divalproex (DEPAKOTE) 500 MG DR tablet Take 1 tablet (500 mg total) by mouth every 12 (twelve) hours. 02/05/20   Starkes-Perry, Juel Burrow, FNP  empagliflozin (JARDIANCE) 10 MG TABS tablet Take 10 mg by mouth daily.  06/11/20   [provider]   glimepiride (AMARYL) 2 MG tablet Take 2 mg by mouth daily with breakfast.    [provider]  insulin aspart (NOVOLOG) 100 UNIT/ML injection 0-15 Units, Subcutaneous, 3 times daily with meals CBG < 70: Implement Hypoglycemia measures CBG 70 - 120: 0 units CBG 121 - 150: 2 units CBG 151 - 200: 3 units CBG 201 - 250: 5 units CBG 251 - 300: 8 units CBG 301 - 350: 11 units CBG 351 - 400: 15 units CBG > 400: call MD 10/10/20   Maretta Bees, MD  liothyronine (CYTOMEL) 25 MCG tablet Take 25 mcg by mouth daily before breakfast.     [provider]  metoprolol tartrate (LOPRESSOR) 25 MG tablet Take 1 tablet (25 mg total) by mouth 2 (two) times daily. 10/16/20   Ghimire, Werner Lean, MD  mirtazapine (REMERON SOL-TAB) 15 MG disintegrating tablet Take 1 tablet (15 mg total) by mouth at bedtime. 02/05/20   Maryagnes Amos, FNP  pantoprazole (PROTONIX) 40 MG tablet Take 1 tablet (40 mg total) by mouth daily at 12 noon. 10/10/20   Ghimire, Werner LeanShanker M, MD  polyethylene glycol powder (GLYCOLAX/MIRALAX) 17 GM/SCOOP powder Take 17 g by mouth daily. 10/14/20   Ghimire, Werner LeanShanker M, MD  QUEtiapine (SEROQUEL) 25 MG tablet Take 25 mg by mouth 2 (two) times daily.     [provider]  QUEtiapine (SEROQUEL) 300 MG tablet Take 300 mg by mouth at bedtime.    [provider]  rivaroxaban (XARELTO) 20 MG TABS tablet Take 1 tablet (20 mg total) by mouth daily with supper. Patient taking differently: Take 20 mg by mouth daily.  04/02/20   Dhungel, Theda BelfastNishant, MD    Allergies    Amlodipine, Onglyza [saxagliptin], and Zyprexa [olanzapine]  Review of Systems   Review of Systems  Unable to perform ROS: Mental status change (level 5 caveat)  Constitutional: Positive for chills, fatigue and fever.  HENT: Negative for ear pain and sore throat.   Eyes: Negative for pain and visual disturbance.  Respiratory: Negative for cough and shortness of breath.   Cardiovascular: Negative for  chest pain and palpitations.  Gastrointestinal: Negative for abdominal pain and vomiting.  Genitourinary: Negative for dysuria and hematuria.  Musculoskeletal: Negative for arthralgias and back pain.  Skin: Negative for color change and rash.  Neurological: Positive for light-headedness and headaches. Negative for syncope.  All other systems reviewed and are negative.   Physical Exam Updated Vital Signs BP 132/77   Pulse (!) 119   Temp (!) 102.5 F (39.2 C) (Oral)   Resp (!) 30   Ht 6\' 1"  (1.854 m)   Wt 120.2 kg   SpO2 96%   BMI 34.96 kg/m   Physical Exam Constitutional:      General: He is not in acute distress.    Appearance: He is obese.  HENT:     Head: Normocephalic and atraumatic.  Eyes:     Conjunctiva/sclera: Conjunctivae normal.     Pupils: Pupils are equal, round, and reactive to light.  Cardiovascular:     Rate and Rhythm: Regular rhythm. Tachycardia present.     Pulses: Normal pulses.  Pulmonary:     Effort: Pulmonary effort is normal. No respiratory distress.  Abdominal:     General: There is no distension.     Palpations: Abdomen is soft.     Tenderness: There is no abdominal tenderness.  Musculoskeletal:     Cervical back: Normal range of motion and neck supple.  Skin:    General: Skin is warm and dry.  Neurological:     General: No focal deficit present.     Mental Status: He is alert. Mental status is at baseline.     ED Results / Procedures / Treatments   Labs (all labs ordered are listed, but only abnormal results are displayed) Labs Reviewed  CULTURE, BLOOD (ROUTINE X 2)  CULTURE, BLOOD (ROUTINE X 2)  COMPREHENSIVE METABOLIC PANEL  LACTIC ACID, PLASMA  LACTIC ACID, PLASMA  CBC WITH DIFFERENTIAL/PLATELET  PROTIME-INR  URINALYSIS, ROUTINE W REFLEX MICROSCOPIC    EKG None  Radiology No results found.  Procedures .Critical Care Performed by: Terald Sleeperrifan, Aydien Majette J, MD Authorized by: Terald Sleeperrifan, Anallely Rosell J, MD   Critical care provider  statement:    Critical care time (minutes):  45   Critical care was necessary to treat or prevent imminent or life-threatening deterioration of the following conditions:  Sepsis   Critical care was time spent personally by me  on the following activities:  Discussions with consultants, evaluation of patient's response to treatment, examination of patient, ordering and performing treatments and interventions, ordering and review of laboratory studies, ordering and review of radiographic studies, pulse oximetry, re-evaluation of patient's condition, obtaining history from patient or surrogate and review of old charts .Central Line  Date/Time: 04/02/2021 1:34 AM Performed by: Terald Sleeper, MD Authorized by: Terald Sleeper, MD   Consent:    Consent obtained:  Verbal   Consent given by:  Guardian   Risks, benefits, and alternatives were discussed: yes     Risks discussed:  Arterial puncture, incorrect placement, infection, pneumothorax and bleeding Universal protocol:    Procedure explained and questions answered to patient or proxy's satisfaction: yes     Relevant documents present and verified: yes     Test results available: yes     Imaging studies available: yes     Required blood products, implants, devices, and special equipment available: yes     Site/side marked: yes     Immediately prior to procedure, a time out was called: yes     Patient identity confirmed:  Arm band Pre-procedure details:    Indication(s): central venous access and insufficient peripheral access     Hand hygiene: Hand hygiene performed prior to insertion     Sterile barrier technique: All elements of maximal sterile technique followed     Skin preparation:  Chlorhexidine   Skin preparation agent: Skin preparation agent completely dried prior to procedure   Sedation:    Sedation type:  None Anesthesia:    Anesthesia method:  Local infiltration   Local anesthetic:  Lidocaine 1% w/o epi Procedure details:     Location:  R internal jugular   Patient position:  Supine   Procedural supplies:  Triple lumen   Catheter size:  7 Fr   Landmarks identified: yes     Ultrasound guidance: yes     Ultrasound guidance timing: real time     Sterile ultrasound techniques: Sterile gel and sterile probe covers were used     Number of attempts:  1   Successful placement: yes   Post-procedure details:    Post-procedure:  Dressing applied and line sutured   Assessment:  Blood return through all ports and free fluid flow   Procedure completion:  Tolerated well, no immediate complications Comments:     Pending placement confirmation by CT chest, patient taken immediately afterwards     Medications Ordered in ED Medications - No data to display  ED Course  I have reviewed the triage vital signs and the nursing notes.  Pertinent labs & imaging results that were available during my care of the patient were reviewed by me and considered in my medical decision making (see chart for details).  This patient presents to the Emergency Department with complaint of altered mental status.  This involves an extensive number of treatment options, and is a complaint that carries with it a high risk of complications and morbidity.  The differential diagnosis includes infection vs hypoglycemia vs metabolic encephalopathy vs polypharmacy vs other  I ordered, reviewed, and interpreted labs, including WBC 16.6.  Lactate 2.1 -> 2.6.  UA without clear evidence of infection.  CMP with baseline CR 2.1, K 5.3.  Covid/flue negative.  LFT normal.   Trop 49.   I ordered medication IV vanco, IV rocephin, IV fluids for sepsis I ordered imaging studies which included dg chest, CTH, CT chest/abd/pelvis I independently visualized and  interpreted imaging which showed no evident PNA on dg chest, CT scans pending at time of signout and the monitor tracing which showed NSR Additional history was obtained from daughter I personally reviewed  the patients ECG which showed sinus rhythm with no acute ischemic findings  During his stay, patient's BP became hypotensive despite IV fluid bolus.  I gave him IV decadron for possible adrenal insufficiency, and ordered Levophed.  He was consented (via daughter) for CVC, which I placed in the right IJ with minimal bleeding.  I discussed the possibility of meningitis with his daughter.  The patient has a mild headache.  He has no significant photophobia or neck stiffness.  We cannot exclude the possibility - however he is on xarelto, and has an obese habitus, and I am concerned about the high risk of iatrogenic bleeding if I were to attempt blind LP.  We are treating with rocephin & vancomycin already, and I would advise inpatient consideration of IR LP if no other clear source of infection emerges.     Clinical Course as of 04/02/21 0133  Wed Apr 01, 2021  2228 Sepsis fluid bolus 2-2.3L per Ideal body weight, will give 2L of LR and initiate on infusion afterwards.  No evidence of shock.  Lactate only mildly elevated at 2.1. [MT]  2229 SARS Coronavirus 2 by RT PCR: NEGATIVE [MT]  2229 WBC(!): 16.6 [MT]  2323 UA not a clear source - added CT abdomen [MT]  Thu Apr 02, 2021  0033 Patient is now hypotensive after fluids.  I discussed consent that his daughter for placement of a central line and will likely need to begin Levophed. [MT]  0100 Right IJ CVC placed.  Patient being taken for CT scans.  Daughter updated.  Signed out to EDP Dr Manus Gunning pending f/u on CT imaging.  Vasopressor Levophed ordered for repeat MAP < 65 mmhg, although subsequent BP has stabilized. [MT]    Clinical Course User Index [MT] Pina Sirianni, Kermit Balo, MD    Final Clinical Impression(s) / ED Diagnoses Final diagnoses:  Sepsis [facility])    Rx / DC Orders ED Discharge Orders    None       Terald Sleeper, MD 04/02/21 [phone]

## 2021-04-01 NOTE — ED Notes (Signed)
1 IV attempt by this RN, unsuccessful

## 2021-04-01 NOTE — ED Notes (Signed)
Notified charge RN stacy that patient needs [place] IV MD Trifan also made aware. IV team consulted.

## 2021-04-01 NOTE — Progress Notes (Signed)
A consult was received from an ED physician for vancomycin per pharmacy dosing.  The patient's profile has been reviewed for ht/wt/allergies/indication/available labs.   A one time order has been placed for Vancomycin 2gm    Further antibiotics/pharmacy consults should be ordered by admitting physician if indicated.                       Thank you, Arley Phenix RPh 04/01/2021, 10:51 PM

## 2021-04-01 NOTE — ED Triage Notes (Addendum)
pts family called for AMS with last known well last night. C/o burning with urination and swelling to bilateral legs.

## 2021-04-01 NOTE — ED Notes (Signed)
Failed attempt x 2 by RN Georgeanna Lea an dfailed attempt x 1 by [facility]; awaiting [place] attempt by IV team. No blood work able to be obtained. Notified MD Trifan.

## 2021-04-02 ENCOUNTER — Emergency Department (HOSPITAL_COMMUNITY): Payer: Medicare PPO

## 2021-04-02 ENCOUNTER — Encounter (HOSPITAL_COMMUNITY): Payer: Self-pay

## 2021-04-02 ENCOUNTER — Other Ambulatory Visit: Payer: Self-pay

## 2021-04-02 ENCOUNTER — Inpatient Hospital Stay (HOSPITAL_COMMUNITY): Payer: Medicare PPO

## 2021-04-02 DIAGNOSIS — E1122 Type 2 diabetes mellitus with diabetic chronic kidney disease: Secondary | ICD-10-CM | POA: Diagnosis present

## 2021-04-02 DIAGNOSIS — A419 Sepsis, unspecified organism: Secondary | ICD-10-CM | POA: Diagnosis present

## 2021-04-02 DIAGNOSIS — I9589 Other hypotension: Secondary | ICD-10-CM | POA: Diagnosis not present

## 2021-04-02 DIAGNOSIS — R6520 Severe sepsis without septic shock: Secondary | ICD-10-CM | POA: Diagnosis not present

## 2021-04-02 DIAGNOSIS — I480 Paroxysmal atrial fibrillation: Secondary | ICD-10-CM | POA: Diagnosis present

## 2021-04-02 DIAGNOSIS — E871 Hypo-osmolality and hyponatremia: Secondary | ICD-10-CM

## 2021-04-02 DIAGNOSIS — Z86711 Personal history of pulmonary embolism: Secondary | ICD-10-CM | POA: Diagnosis not present

## 2021-04-02 DIAGNOSIS — Z20822 Contact with and (suspected) exposure to covid-19: Secondary | ICD-10-CM | POA: Diagnosis present

## 2021-04-02 DIAGNOSIS — I5031 Acute diastolic (congestive) heart failure: Secondary | ICD-10-CM | POA: Diagnosis not present

## 2021-04-02 DIAGNOSIS — F3131 Bipolar disorder, current episode depressed, mild: Secondary | ICD-10-CM | POA: Diagnosis not present

## 2021-04-02 DIAGNOSIS — E118 Type 2 diabetes mellitus with unspecified complications: Secondary | ICD-10-CM

## 2021-04-02 DIAGNOSIS — I130 Hypertensive heart and chronic kidney disease with heart failure and stage 1 through stage 4 chronic kidney disease, or unspecified chronic kidney disease: Secondary | ICD-10-CM | POA: Diagnosis present

## 2021-04-02 DIAGNOSIS — N1832 Chronic kidney disease, stage 3b: Secondary | ICD-10-CM | POA: Diagnosis present

## 2021-04-02 DIAGNOSIS — E669 Obesity, unspecified: Secondary | ICD-10-CM | POA: Diagnosis present

## 2021-04-02 DIAGNOSIS — R6521 Severe sepsis with septic shock: Secondary | ICD-10-CM | POA: Diagnosis present

## 2021-04-02 DIAGNOSIS — E039 Hypothyroidism, unspecified: Secondary | ICD-10-CM | POA: Diagnosis present

## 2021-04-02 DIAGNOSIS — E875 Hyperkalemia: Secondary | ICD-10-CM | POA: Diagnosis present

## 2021-04-02 DIAGNOSIS — N179 Acute kidney failure, unspecified: Secondary | ICD-10-CM | POA: Diagnosis present

## 2021-04-02 DIAGNOSIS — R509 Fever, unspecified: Secondary | ICD-10-CM | POA: Diagnosis present

## 2021-04-02 DIAGNOSIS — E872 Acidosis, unspecified: Secondary | ICD-10-CM | POA: Diagnosis present

## 2021-04-02 DIAGNOSIS — E785 Hyperlipidemia, unspecified: Secondary | ICD-10-CM | POA: Diagnosis present

## 2021-04-02 DIAGNOSIS — I2510 Atherosclerotic heart disease of native coronary artery without angina pectoris: Secondary | ICD-10-CM | POA: Diagnosis present

## 2021-04-02 DIAGNOSIS — Z6835 Body mass index (BMI) 35.0-35.9, adult: Secondary | ICD-10-CM | POA: Diagnosis not present

## 2021-04-02 DIAGNOSIS — G9341 Metabolic encephalopathy: Secondary | ICD-10-CM | POA: Diagnosis present

## 2021-04-02 DIAGNOSIS — Z9114 Patient's other noncompliance with medication regimen: Secondary | ICD-10-CM | POA: Diagnosis not present

## 2021-04-02 DIAGNOSIS — M109 Gout, unspecified: Secondary | ICD-10-CM | POA: Diagnosis present

## 2021-04-02 DIAGNOSIS — F319 Bipolar disorder, unspecified: Secondary | ICD-10-CM | POA: Diagnosis present

## 2021-04-02 DIAGNOSIS — Z86718 Personal history of other venous thrombosis and embolism: Secondary | ICD-10-CM | POA: Diagnosis not present

## 2021-04-02 DIAGNOSIS — I5033 Acute on chronic diastolic (congestive) heart failure: Secondary | ICD-10-CM | POA: Diagnosis present

## 2021-04-02 DIAGNOSIS — R4182 Altered mental status, unspecified: Secondary | ICD-10-CM | POA: Diagnosis present

## 2021-04-02 DIAGNOSIS — Z9861 Coronary angioplasty status: Secondary | ICD-10-CM | POA: Diagnosis not present

## 2021-04-02 LAB — CBG MONITORING, ED: Glucose-Capillary: 119 mg/dL — ABNORMAL HIGH (ref 70–99)

## 2021-04-02 LAB — GLUCOSE, CAPILLARY
Glucose-Capillary: 116 mg/dL — ABNORMAL HIGH (ref 70–99)
Glucose-Capillary: 133 mg/dL — ABNORMAL HIGH (ref 70–99)
Glucose-Capillary: 148 mg/dL — ABNORMAL HIGH (ref 70–99)
Glucose-Capillary: 157 mg/dL — ABNORMAL HIGH (ref 70–99)

## 2021-04-02 LAB — BLOOD GAS, ARTERIAL
Acid-base deficit: 0.9 mmol/L (ref 0.0–2.0)
Bicarbonate: 22.6 mmol/L (ref 20.0–28.0)
FIO2: 21
O2 Saturation: 96.6 %
Patient temperature: 101.2
pCO2 arterial: 37.7 mmHg (ref 32.0–48.0)
pH, Arterial: 7.402 (ref 7.350–7.450)
pO2, Arterial: 94 mmHg (ref 83.0–108.0)

## 2021-04-02 LAB — CBC
HCT: 41.4 % (ref 39.0–52.0)
Hemoglobin: 12.7 g/dL — ABNORMAL LOW (ref 13.0–17.0)
MCH: 25.5 pg — ABNORMAL LOW (ref 26.0–34.0)
MCHC: 30.7 g/dL (ref 30.0–36.0)
MCV: 83.1 fL (ref 80.0–100.0)
Platelets: 181 10*3/uL (ref 150–400)
RBC: 4.98 MIL/uL (ref 4.22–5.81)
RDW: 16.1 % — ABNORMAL HIGH (ref 11.5–15.5)
WBC: 14.7 10*3/uL — ABNORMAL HIGH (ref 4.0–10.5)
nRBC: 0 % (ref 0.0–0.2)

## 2021-04-02 LAB — BASIC METABOLIC PANEL
Anion gap: 9 (ref 5–15)
BUN: 35 mg/dL — ABNORMAL HIGH (ref 8–23)
CO2: 22 mmol/L (ref 22–32)
Calcium: 8.7 mg/dL — ABNORMAL LOW (ref 8.9–10.3)
Chloride: 107 mmol/L (ref 98–111)
Creatinine, Ser: 1.88 mg/dL — ABNORMAL HIGH (ref 0.61–1.24)
GFR, Estimated: 39 mL/min — ABNORMAL LOW (ref >60)
Glucose, Bld: 109 mg/dL — ABNORMAL HIGH (ref 70–99)
Potassium: 4.7 mmol/L (ref 3.5–5.1)
Sodium: 138 mmol/L (ref 135–145)

## 2021-04-02 LAB — LACTIC ACID, PLASMA
Lactic Acid, Venous: 1.1 mmol/L (ref 0.5–1.9)
Lactic Acid, Venous: 0.9 mmol/L (ref 0.5–1.9)

## 2021-04-02 LAB — MRSA PCR SCREENING: MRSA by PCR: NEGATIVE

## 2021-04-02 LAB — MAGNESIUM: Magnesium: 2 mg/dL (ref 1.7–2.4)

## 2021-04-02 LAB — CORTISOL: Cortisol, Plasma: 28.1 ug/dL

## 2021-04-02 LAB — TSH: TSH: 0.519 u[IU]/mL (ref 0.350–4.500)

## 2021-04-02 LAB — HIV ANTIBODY (ROUTINE TESTING W REFLEX): HIV Screen 4th Generation wRfx: NONREACTIVE

## 2021-04-02 LAB — BRAIN NATRIURETIC PEPTIDE: B Natriuretic Peptide: 455.5 pg/mL — ABNORMAL HIGH (ref 0.0–100.0)

## 2021-04-02 MED ORDER — POLYETHYLENE GLYCOL 3350 17 G PO PACK
17.0000 g | PACK | Freq: Every day | ORAL | Status: DC | PRN
  Administered 2021-04-05 – 2021-04-06 (×2): 17 g via ORAL
  Filled 2021-04-02 (×2): qty 1

## 2021-04-02 MED ORDER — NOREPINEPHRINE 4 MG/250ML-% IV SOLN: 2.0000 ug/min | INTRAVENOUS | Status: DC

## 2021-04-02 MED ORDER — NOREPINEPHRINE 4 MG/250ML-% IV SOLN
0.0000 ug/min | INTRAVENOUS | Status: DC
  Administered 2021-04-02: 2 ug/min via INTRAVENOUS
  Filled 2021-04-02: qty 250

## 2021-04-02 MED ORDER — CHLORHEXIDINE GLUCONATE CLOTH 2 % EX PADS
6.0000 | MEDICATED_PAD | Freq: Every day | CUTANEOUS | Status: DC
  Administered 2021-04-02 – 2021-04-06 (×5): 6 via TOPICAL

## 2021-04-02 MED ORDER — SODIUM CHLORIDE 0.9 % IV SOLN
250.0000 mL | INTRAVENOUS | Status: DC
  Administered 2021-04-02 – 2021-04-03 (×2): 250 mL via INTRAVENOUS

## 2021-04-02 MED ORDER — ONDANSETRON HCL 4 MG/2ML IJ SOLN: 4.0000 mg | Freq: Four times a day (QID) | INTRAMUSCULAR | Status: DC | PRN

## 2021-04-02 MED ORDER — DEXAMETHASONE SODIUM PHOSPHATE 10 MG/ML IJ SOLN
8.0000 mg | Freq: Once | INTRAMUSCULAR | Status: AC
  Administered 2021-04-02: 8 mg via INTRAVENOUS
  Filled 2021-04-02: qty 1

## 2021-04-02 MED ORDER — DOCUSATE SODIUM 100 MG PO CAPS
100.0000 mg | ORAL_CAPSULE | Freq: Two times a day (BID) | ORAL | Status: DC | PRN
  Administered 2021-04-04 – 2021-04-06 (×3): 100 mg via ORAL
  Filled 2021-04-02 (×3): qty 1

## 2021-04-02 MED ORDER — SODIUM CHLORIDE 0.9 % IV BOLUS
1000.0000 mL | Freq: Once | INTRAVENOUS | Status: AC
  Administered 2021-04-02: 1000 mL via INTRAVENOUS

## 2021-04-02 MED ORDER — INSULIN ASPART 100 UNIT/ML IJ SOLN
0.0000 [IU] | INTRAMUSCULAR | Status: DC
  Administered 2021-04-02: 2 [IU] via SUBCUTANEOUS
  Administered 2021-04-02 – 2021-04-03 (×3): 1 [IU] via SUBCUTANEOUS
  Administered 2021-04-04: 3 [IU] via SUBCUTANEOUS
  Administered 2021-04-04 (×2): 2 [IU] via SUBCUTANEOUS
  Administered 2021-04-04 – 2021-04-05 (×2): 1 [IU] via SUBCUTANEOUS
  Administered 2021-04-05: 2 [IU] via SUBCUTANEOUS
  Administered 2021-04-05: 1 [IU] via SUBCUTANEOUS
  Administered 2021-04-05: 2 [IU] via SUBCUTANEOUS
  Administered 2021-04-05 (×2): 1 [IU] via SUBCUTANEOUS
  Administered 2021-04-06 (×3): 2 [IU] via SUBCUTANEOUS
  Administered 2021-04-06: 1 [IU] via SUBCUTANEOUS
  Filled 2021-04-02: qty 0.09

## 2021-04-02 MED ORDER — IBUPROFEN 200 MG PO TABS
600.0000 mg | ORAL_TABLET | Freq: Once | ORAL | Status: AC
  Administered 2021-04-02: 600 mg via ORAL
  Filled 2021-04-02: qty 3

## 2021-04-02 MED ORDER — PANTOPRAZOLE SODIUM 40 MG IV SOLR
40.0000 mg | Freq: Every day | INTRAVENOUS | Status: DC
  Administered 2021-04-02 – 2021-04-03 (×2): 40 mg via INTRAVENOUS
  Filled 2021-04-02 (×2): qty 40

## 2021-04-02 MED ORDER — SODIUM CHLORIDE 0.9 % IV SOLN
2.0000 g | Freq: Once | INTRAVENOUS | Status: AC
  Administered 2021-04-02: 2 g via INTRAVENOUS
  Filled 2021-04-02: qty 20

## 2021-04-02 MED ORDER — ACETAMINOPHEN 325 MG PO TABS
650.0000 mg | ORAL_TABLET | Freq: Four times a day (QID) | ORAL | Status: DC | PRN
  Administered 2021-04-04: 650 mg via ORAL
  Filled 2021-04-02: qty 2

## 2021-04-02 MED ORDER — IOHEXOL 300 MG/ML  SOLN
100.0000 mL | Freq: Once | INTRAMUSCULAR | Status: AC | PRN
  Administered 2021-04-02: 70 mL via INTRAVENOUS

## 2021-04-02 MED ORDER — RIVAROXABAN 20 MG PO TABS
20.0000 mg | ORAL_TABLET | Freq: Every day | ORAL | Status: DC
  Administered 2021-04-02 – 2021-04-06 (×5): 20 mg via ORAL
  Filled 2021-04-02 (×5): qty 1

## 2021-04-02 NOTE — ED Notes (Signed)
Patient is resting comfortably. 

## 2021-04-02 NOTE — ED Provider Notes (Signed)
Care assumed from Dr. Renaye Rakers.  Patient with headache, fever and altered mental status, unclear source of his fever. Noncompliant with mental health meds.  Patient takes Xarelto and cannot perform lumbar puncture.  Patient seen by Dr. Ardeth Perfect critical care.  Patient was never started on pressors and his blood pressure has improved to the 120s systolic.  He does not need ICU level of care or pressors. D/w Dr. Rachael Darby of hospitalist service.  CT chest, abdomen and pelvis negative for acute abnormality  After Dr. Darcella Cheshire evaluation, blood pressure dropped into the 80s again and levophed was restarted.  He was updated and accepted patient to ICU.  Patient covered with vancomycin and rocephin. Unclear source of fever and sepsis but meningitis considered. Unable to perform LP due to xarelto use. Did receive steroids from Dr. Renaye Rakers.  .Critical Care Performed by: Glynn Octave, MD Authorized by: Glynn Octave, MD   Critical care provider statement:    Critical care time (minutes):  45   Critical care was necessary to treat or prevent imminent or life-threatening deterioration of the following conditions:  Sepsis and shock   Critical care was time spent personally by me on the following activities:  Discussions with consultants, evaluation of patient's response to treatment, examination of patient, ordering and performing treatments and interventions, ordering and review of laboratory studies, ordering and review of radiographic studies, pulse oximetry, re-evaluation of patient's condition, obtaining history from patient or surrogate and review of old charts     Glynn Octave, MD 04/02/21 [phone]

## 2021-04-02 NOTE — Progress Notes (Signed)
  PCCM Progress Note   S:  Patient with c/o generalized weakness but otherwise denies pain/ SOB Briefly on NE overnight, off since 0300 Fever resolved  Voiding well via foley sCr improving 2.13-> 1.88 WBC improving 16.6-> 14.7 BNP 455 TSH 0.519 Cortisol 28.1 Hgb stable Lactate cleared Eating /drinking well Remains on room air   O:  General:  Elderly male lying in bed in NAD HEENT: MM pink/moist Neuro: Alert, oriented x3, MAE CV: NSR, no murmur PULM:  Non labored, CTA GI: soft, bs active, NT, foley  Extremities: warm/dry, +1-2 LE edema  Skin: no rashes   Blood pressure (!) 128/76, pulse 72, temperature 98.6 F (37 C), resp. rate 19, height 6' (1.829 m), weight 117.8 kg, SpO2 96 %.   A/ P:   Severe sepsis- resolving  AKI on CKD stage III- improving  - source unclear thus far, UA ok, imaging negative - BCx pending, CXR after CVL placement, ? Developing L infiltrate but no URI s/s - continue ceftriaxone for now - d/c foley and CVL - trend WBC/ fever curve - ok for transfer to tele and to [facility] as of 5/6.  PCCM will be available as needed - hold home diuresis for today, restart home meds tomorrow    Posey Boyer, ACNP [facility] 04/02/2021, 4:59 PM

## 2021-04-02 NOTE — H&P (Signed)
NAME:  Christian [name] MRN:  [id number] DOB:  [birth year] LOS: 0 ADMISSION DATE:  04/01/2021 DATE OF SERVICE:  04/02/2021  CHIEF COMPLAINT: Altered mental status  HISTORY & PHYSICAL  History of Present Illness  This 70 y.o. African-American male presented to [facility] emergency department with altered mental status (last known well time approximately 24 hours ago).  On presentation, the patient also reported burning with urination and bilateral leg swelling.  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  The patient reports that he "has not been taking his pills".  However, he insists that he has been adherent to treatment with Xarelto but reports that he is not taking his "Seroquel and other stuff".  Per discussion with Dr. Manus Gunningancour (emergency department), the patient initially presented with confusion, fever (102.28F) and hypotension.  The patient initially responded favorably to "sepsis bolus".  In fact, at the time of clinical encounter, the patient was normotensive without requiring vasopressor support.  Subsequently, the emergency department called to report that the hospitalist service still had not seen the patient and vasopressors were required.  REVIEW OF SYSTEMS Constitutional: Fever.  No chills.  No weight loss. No night sweats. No fatigue. HEENT: No headaches, dysphagia, sore throat, otalgia, nasal congestion, PND CV: Bilateral lower extremity swelling.  No chest pain, orthopnea, PND, palpitations GI:  No abdominal pain, nausea, vomiting, diarrhea, change in bowel pattern, anorexia Resp: No DOE, rest dyspnea, cough, mucus, hemoptysis, wheezing  GU: dysuria.  No flank pain. MS:  No joint pain or swelling. No myalgias,  No decreased range of motion.  Psych:  No change in mood or affect. No memory loss. Skin: no rash or lesions.   Past Medical/Surgical/Social/Family History   Past Medical History:  Diagnosis Date  . Bilateral lower  extremity edema: chronic with venous stasis changes 06/02/2014  . Bipolar 1 disorder (HCC)   . Bipolar disorder (HCC)   . CHF (congestive heart failure) (HCC)   . Chronic kidney disease   . Gout   . Gout   . Hx of blood clots   . Hypertension   . Mood swings   . OSA on CPAP 06/02/2014  . Other and unspecified hyperlipidemia 06/02/2014  . Type II or unspecified type diabetes mellitus with unspecified complication, uncontrolled 06/02/2014    Past Surgical History:  Procedure Laterality Date  . LEFT HEART CATH AND CORONARY ANGIOGRAPHY Left 04/01/2020   Procedure: LEFT HEART CATH AND CORONARY ANGIOGRAPHY;  Surgeon: Laurier NancyKhan, Shaukat A, MD;  Location: [facility] INVASIVE CV LAB;  Service: Cardiovascular;  Laterality: Left;    Social History   Tobacco Use  . Smoking status: Never Smoker  . Smokeless tobacco: Never Used  Substance Use Topics  . Alcohol use: No    Family History  Problem Relation Age of Onset  . Dementia Mother   . Arthritis Father   . Hypertension Father   . Mental illness Sister   . Diabetes Sister   . Heart failure Neg Hx   . Kidney failure Neg Hx   . Cancer Neg Hx     Procedures:     Significant Diagnostic Tests:  5/5: Head CT normal.  CT chest/abdomen/pelvis was interpreted to show no acute abnormalities.   Micro Data:   Results for orders placed or performed during the hospital encounter of 04/01/21  Resp Panel by RT-PCR (Flu A&B, Covid) Nasopharyngeal Swab     Status: None   Collection Time:  04/01/21  8:29 PM   Specimen: Nasopharyngeal Swab; Nasopharyngeal(NP) swabs in vial transport medium  Result Value Ref Range Status   SARS Coronavirus 2 by RT PCR NEGATIVE NEGATIVE Final    Comment: (NOTE) SARS-CoV-2 target nucleic acids are NOT DETECTED.  The SARS-CoV-2 RNA is generally detectable in upper respiratory specimens during the acute phase of infection. The lowest concentration of SARS-CoV-2 viral copies this assay can detect is 138 copies/mL. A negative  result does not preclude SARS-Cov-2 infection and should not be used as the sole basis for treatment or other patient management decisions. A negative result may occur with  improper specimen collection/handling, submission of specimen other than nasopharyngeal swab, presence of viral mutation(s) within the areas targeted by this assay, and inadequate number of viral copies(<138 copies/mL). A negative result must be combined with clinical observations, patient history, and epidemiological information. The expected result is Negative.  Fact Sheet for Patients:  BloggerCourse.com  Fact Sheet for Healthcare Providers:  SeriousBroker.it  This test is no t yet approved or cleared by the [place] FDA and  has been authorized for detection and/or diagnosis of SARS-CoV-2 by FDA under an Emergency Use Authorization (EUA). This EUA will remain  in effect (meaning this test can be used) for the duration of the COVID-19 declaration under Section 564(b)(1) of the Act, 21 U.S.C.section 360bbb-3(b)(1), unless the authorization is terminated  or revoked sooner.       Influenza A by PCR NEGATIVE NEGATIVE Final   Influenza B by PCR NEGATIVE NEGATIVE Final    Comment: (NOTE) The Xpert Xpress SARS-CoV-2/FLU/RSV plus assay is intended as an aid in the diagnosis of influenza from Nasopharyngeal swab specimens and should not be used as a sole basis for treatment. Nasal washings and aspirates are unacceptable for Xpert Xpress SARS-CoV-2/FLU/RSV testing.  Fact Sheet for Patients: BloggerCourse.com  Fact Sheet for Healthcare Providers: SeriousBroker.it  This test is not yet approved or cleared by the [place] FDA and has been authorized for detection and/or diagnosis of SARS-CoV-2 by FDA under an Emergency Use Authorization (EUA). This EUA will remain in effect (meaning this test can be used)  for the duration of the COVID-19 declaration under Section 564(b)(1) of the Act, 21 U.S.C. section 360bbb-3(b)(1), unless the authorization is terminated or revoked.  Performed at [facility], [address], [place], [place] [address]       Antimicrobials:  Rocephin/vancomycin (5/5>>)   Interim history/subjective:     Objective   BP 90/63   Pulse 92   Temp (!) 101.3 F (38.5 C)   Resp (!) 24   Ht 6' (1.829 m)   Wt 120.2 kg   SpO2 92%   BMI 35.94 kg/m     Filed Weights   04/01/21 1832 04/01/21 1923  Weight: 120.2 kg 120.2 kg    Intake/Output Summary (Last 24 hours) at 04/02/2021 0406 Last data filed at 04/02/2021 0346 Gross per 24 hour  Intake 3503.12 ml  Output 600 ml  Net 2903.12 ml        Examination: GENERAL: alert, oriented to time, person and place, pleasant, well-developed. No acute distress. HEAD: normocephalic, atraumatic EYE: PERRLA, EOM intact, no scleral icterus, no pallor. THROAT/ORAL CAVITY: Normal dentition. No oral thrush. No exudate. Mucous membranes are moist. No tonsillar enlargement.  NECK: supple, no thyromegaly, no JVD, no lymphadenopathy. Trachea midline. CHEST/LUNG: symmetric in development and expansion. Good air entry. No crackles. No wheezes. HEART: Regular S1 and S2 without murmur, rub or gallop.  ABDOMEN: soft, nontender, nondistended. Normoactive bowel sounds. No rebound. No guarding. No hepatosplenomegaly. EXTREMITIES: Edema: 3+. No cyanosis.  No clubbing. 2+ DP pulses LYMPHATIC: no cervical/axillary/inguinal lymph nodes appreciated MUSCULOSKELETAL: No point tenderness.  No bulk atrophy. Joints: Normal inspection.  SKIN: No rash or lesion. NEUROLOGIC: Doll's eyes intact. Corneal reflex intact. Spontaneous respirations intact. Cranial nerves II-XII are grossly symmetric and physiologic. Babinski absent. No sensory deficit. Motor: 5/5 @ RUE, 5/5 @ LUE, 5/5 @ RLL,  5/5 @ LLL.  DTR: 2+ @ R biceps, 2+ @ L biceps, 2+ @  R patellar,  2+ @ L patellar. No cerebellar signs. Gait was not assessed.   Resolved Hospital Problem list      Assessment & Plan:   ASSESSMENT/PLAN:  ASSESSMENT (included in the Hospital Problem List)  Active Problems:   Type 2 diabetes mellitus with complication, without long-term current use of insulin (HCC)   Bipolar affective disorder, depressed, mild (HCC)   Sepsis (HCC)   Lactic acidosis   Hyponatremia   Hyperkalemia   Chronic kidney disease, stage 3b (HCC)   Fever   By systems: PULMONARY: No acute issues  Titrate supplemental oxygen to maintain SPO2 93+%   CARDIOVASCULAR Hypotension/septic shock  Repeat BNP.  Check lactate.   RENAL Chronic kidney disease, stage IIIb Hyperkalemia Hyponatremia  Check TSH.  Check cortisol.  CT abdomen/pelvis was interpreted to show no acute abnormality.  Images were reviewed in person and do seem to suggest early development of hydronephrosis with possible (radiolucent) ureteral obstruction.  Place Foley catheter.  Monitor urine output.  Avoid nephrotoxic drugs.  Renal dosing of medications.   GASTROINTESTINAL: No acute issues  GI PROPHYLAXIS: Protonix   HEMATOLOGIC  Leukocytosis  DVT PROPHYLAXIS: Xarelto   INFECTIOUS  Fever of unknown origin  Blood culture, urine culture.   ENDOCRINE Hypothyroidism Type 2 diabetes  Check TSH.  Check random cortisol.   NEUROLOGIC Altered mental status Bipolar disorder, type I  clinically improved while in the emergency department   PLAN/RECOMMENDATIONS   Admit to ICU under my service (Attending: Marcelle Smiling, MD) with the diagnoses highlighted above in the active Hospital Problem List (ASSESSMENT).     My assessment, plan of care, findings, medications, side effects, etc. were discussed with: nurse and Dr. Manus Gunning (Emergency department).   Best practice:  Diet: Regular Pain/Anxiety/Delirium protocol (if indicated): Not applicable VAP protocol (if  indicated): Not applicable DVT prophylaxis: Xarelto GI prophylaxis: Protonix Glucose control: Sliding scale insulin Mobility/Activity: Bedrest   Code Status: Full Code Family Communication:  No family at bedside Disposition: Admit to ICU   Labs   CBC: Recent Labs  Lab 04/01/21 1947  WBC 16.6*  NEUTROABS 14.9*  HGB 14.7  HCT 49.5  MCV 84.5  PLT 209    Basic Metabolic Panel: Recent Labs  Lab 04/01/21 1947  NA 131*  K 5.3*  CL 100  CO2 20*  GLUCOSE 206*  BUN 38*  CREATININE 2.13*  CALCIUM 9.1   GFR: Estimated Creatinine Clearance: 45 mL/min (A) (by C-G formula based on SCr of 2.13 mg/dL (H)). Recent Labs  Lab 04/01/21 1947 04/01/21 2208  WBC 16.6*  --   LATICACIDVEN 2.1* 2.6*    Liver Function Tests: Recent Labs  Lab 04/01/21 1947  AST 36  ALT 38  ALKPHOS 67  BILITOT 0.7  PROT 7.5  ALBUMIN 3.5   No results for input(s): LIPASE, AMYLASE in the last 168 hours. No results for input(s): AMMONIA in the last 168 hours.  ABG  Component Value Date/Time   PHART 7.402 04/02/2021 0130   PCO2ART 37.7 04/02/2021 0130   PO2ART 94.0 04/02/2021 0130   HCO3 22.6 04/02/2021 0130   TCO2 20.9 03/24/2011 0802   ACIDBASEDEF 0.9 04/02/2021 0130   O2SAT 96.6 04/02/2021 0130     Coagulation Profile: Recent Labs  Lab 04/01/21 2029  INR 1.6*    Cardiac Enzymes: No results for input(s): CKTOTAL, CKMB, CKMBINDEX, TROPONINI in the last 168 hours.  HbA1C: Hgb A1c MFr Bld  Date/Time Value Ref Range Status  10/08/2020 03:53 AM 11.1 (H) 4.8 - 5.6 % Final    Comment:    (NOTE) Pre diabetes:          5.7%-6.4%  Diabetes:              >6.4%  Glycemic control for   <7.0% adults with diabetes   03/31/2020 12:33 PM 8.7 (H) 4.8 - 5.6 % Final    Comment:    (NOTE) Pre diabetes:          5.7%-6.4% Diabetes:              >6.4% Glycemic control for   <7.0% adults with diabetes     CBG: No results for input(s): GLUCAP in the last 168 hours.   Past Medical  History   Past Medical History:  Diagnosis Date  . Bilateral lower extremity edema: chronic with venous stasis changes 06/02/2014  . Bipolar 1 disorder (HCC)   . Bipolar disorder (HCC)   . CHF (congestive heart failure) (HCC)   . Chronic kidney disease   . Gout   . Gout   . Hx of blood clots   . Hypertension   . Mood swings   . OSA on CPAP 06/02/2014  . Other and unspecified hyperlipidemia 06/02/2014  . Type II or unspecified type diabetes mellitus with unspecified complication, uncontrolled 06/02/2014      Surgical History    Past Surgical History:  Procedure Laterality Date  . LEFT HEART CATH AND CORONARY ANGIOGRAPHY Left 04/01/2020   Procedure: LEFT HEART CATH AND CORONARY ANGIOGRAPHY;  Surgeon: Laurier Nancy, MD;  Location: [facility] INVASIVE CV LAB;  Service: Cardiovascular;  Laterality: Left;      Social History   Social History   Socioeconomic History  . Marital status: Divorced    Spouse name: Not on file  . Number of children: Not on file  . Years of education: Not on file  . Highest education level: Not on file  Occupational History  . Not on file  Tobacco Use  . Smoking status: Never Smoker  . Smokeless tobacco: Never Used  Substance and Sexual Activity  . Alcohol use: No  . Drug use: No  . Sexual activity: Not Currently  Other Topics Concern  . Not on file  Social History Narrative   *The patient is divorced and is currently on disability. He lives alone and has one daughter.             Social Determinants of Health   Financial Resource Strain: Not on file  Food Insecurity: Not on file  Transportation Needs: Not on file  Physical Activity: Not on file  Stress: Not on file  Social Connections: Not on file      Family History    Family History  Problem Relation Age of Onset  . Dementia Mother   . Arthritis Father   . Hypertension Father   . Mental illness Sister   . Diabetes Sister   .  Heart failure Neg Hx   . Kidney failure Neg Hx   .  Cancer Neg Hx    family history includes Arthritis in his father; Dementia in his mother; Diabetes in his sister; Hypertension in his father; Mental illness in his sister. There is no history of Heart failure, Kidney failure, or Cancer.    Allergies Allergies  Allergen Reactions  . Amlodipine Swelling and Other (See Comments)    Legs became swollen  . Onglyza [Saxagliptin] Other (See Comments)    "Allergic," per [facility]  . Zyprexa [Olanzapine] Other (See Comments)    Allergy noted by ACT Team. No reaction specified (??)- "Allergic," per [facility]      Current Medications  Current Facility-Administered Medications:  .  norepinephrine (LEVOPHED) 4mg  in premix infusion, 0-40 mcg/min, Intravenous, Continuous, Trifan, , MD, Stopped at 04/02/21 [id number]  Current Outpatient Medications:  .  acetaminophen (TYLENOL) 500 MG tablet, Take 500-1,000 mg by mouth every 8 (eight) hours as needed for mild pain or headache., Disp: , Rfl:  .  ARIPiprazole (ABILIFY) 20 MG tablet, Take 1 tablet (20 mg total) by mouth daily., Disp: 30 tablet, Rfl: 0 .  atorvastatin (LIPITOR) 10 MG tablet, Take 1 tablet (10 mg total) by mouth daily at 6 PM. (Patient taking differently: Take 10 mg by mouth daily. ), Disp: 30 tablet, Rfl: 0 .  buPROPion (WELLBUTRIN) 75 MG tablet, Take 1 tablet (75 mg total) by mouth daily., Disp: 30 tablet, Rfl: 0 .  clopidogrel (PLAVIX) 75 MG tablet, Take 75 mg by mouth daily. , Disp: , Rfl:  .  divalproex (DEPAKOTE) 500 MG DR tablet, Take 1 tablet (500 mg total) by mouth every 12 (twelve) hours., Disp: 60 tablet, Rfl: 0 .  empagliflozin (JARDIANCE) 10 MG TABS tablet, Take 10 mg by mouth daily. , Disp: , Rfl:  .  glimepiride (AMARYL) 2 MG tablet, Take 2 mg by mouth daily with breakfast., Disp: , Rfl:  .  insulin aspart (NOVOLOG) 100 UNIT/ML injection, 0-15 Units, Subcutaneous, 3 times daily with meals CBG < 70: Implement Hypoglycemia measures CBG 70 - 120: 0  units CBG 121 - 150: 2 units CBG 151 - 200: 3 units CBG 201 - 250: 5 units CBG 251 - 300: 8 units CBG 301 - 350: 11 units CBG 351 - 400: 15 units CBG > 400: call MD, Disp: 10 mL, Rfl: 11 .  liothyronine (CYTOMEL) 25 MCG tablet, Take 25 mcg by mouth daily before breakfast. , Disp: , Rfl:  .  metoprolol tartrate (LOPRESSOR) 25 MG tablet, Take 1 tablet (25 mg total) by mouth 2 (two) times daily., Disp: , Rfl:  .  mirtazapine (REMERON SOL-TAB) 15 MG disintegrating tablet, Take 1 tablet (15 mg total) by mouth at bedtime., Disp: 30 tablet, Rfl: 0 .  pantoprazole (PROTONIX) 40 MG tablet, Take 1 tablet (40 mg total) by mouth daily at 12 noon., Disp: , Rfl:  .  polyethylene glycol powder (GLYCOLAX/MIRALAX) 17 GM/SCOOP powder, Take 17 g by mouth daily., Disp: 255 g, Rfl: 0 .  QUEtiapine (SEROQUEL) 25 MG tablet, Take 25 mg by mouth 2 (two) times daily. , Disp: , Rfl:  .  QUEtiapine (SEROQUEL) 300 MG tablet, Take 300 mg by mouth at bedtime., Disp: , Rfl:  .  rivaroxaban (XARELTO) 20 MG TABS tablet, Take 1 tablet (20 mg total) by mouth daily with supper. (Patient taking differently: Take 20 mg by mouth daily. ), Disp: 30 tablet, Rfl: 0  Home Medications  Prior to Admission medications   Medication Sig Start Date End Date Taking? Authorizing Provider  acetaminophen (TYLENOL) 500 MG tablet Take 500-1,000 mg by mouth every 8 (eight) hours as needed for mild pain or headache.    [provider]  ARIPiprazole (ABILIFY) 20 MG tablet Take 1 tablet (20 mg total) by mouth daily. 02/06/20   Starkes-Perry, Juel Burrow, FNP  atorvastatin (LIPITOR) 10 MG tablet Take 1 tablet (10 mg total) by mouth daily at 6 PM. Patient taking differently: Take 10 mg by mouth daily.  02/05/20   Starkes-Perry, Juel Burrow, FNP  buPROPion (WELLBUTRIN) 75 MG tablet Take 1 tablet (75 mg total) by mouth daily. 02/06/20   Maryagnes Amos, FNP  clopidogrel (PLAVIX) 75 MG tablet Take 75 mg by mouth daily.  04/30/20   [provider]   divalproex (DEPAKOTE) 500 MG DR tablet Take 1 tablet (500 mg total) by mouth every 12 (twelve) hours. 02/05/20   Starkes-Perry, Juel Burrow, FNP  empagliflozin (JARDIANCE) 10 MG TABS tablet Take 10 mg by mouth daily.  06/11/20   [provider]  glimepiride (AMARYL) 2 MG tablet Take 2 mg by mouth daily with breakfast.    [provider]  insulin aspart (NOVOLOG) 100 UNIT/ML injection 0-15 Units, Subcutaneous, 3 times daily with meals CBG < 70: Implement Hypoglycemia measures CBG 70 - 120: 0 units CBG 121 - 150: 2 units CBG 151 - 200: 3 units CBG 201 - 250: 5 units CBG 251 - 300: 8 units CBG 301 - 350: 11 units CBG 351 - 400: 15 units CBG > 400: call MD 10/10/20   Maretta Bees, MD  liothyronine (CYTOMEL) 25 MCG tablet Take 25 mcg by mouth daily before breakfast.     [provider]  metoprolol tartrate (LOPRESSOR) 25 MG tablet Take 1 tablet (25 mg total) by mouth 2 (two) times daily. 10/16/20   Ghimire, Werner Lean, MD  mirtazapine (REMERON SOL-TAB) 15 MG disintegrating tablet Take 1 tablet (15 mg total) by mouth at bedtime. 02/05/20   Starkes-Perry, Juel Burrow, FNP  pantoprazole (PROTONIX) 40 MG tablet Take 1 tablet (40 mg total) by mouth daily at 12 noon. 10/10/20   Ghimire, Werner Lean, MD  polyethylene glycol powder (GLYCOLAX/MIRALAX) 17 GM/SCOOP powder Take 17 g by mouth daily. 10/14/20   Ghimire, Werner Lean, MD  QUEtiapine (SEROQUEL) 25 MG tablet Take 25 mg by mouth 2 (two) times daily.     [provider]  QUEtiapine (SEROQUEL) 300 MG tablet Take 300 mg by mouth at bedtime.    [provider]  rivaroxaban (XARELTO) 20 MG TABS tablet Take 1 tablet (20 mg total) by mouth daily with supper. Patient taking differently: Take 20 mg by mouth daily.  04/02/20   Dhungel, Theda Belfast, MD      Critical care time: 30 minutes.  The treatment and management of the patient's condition was required based on the threat of imminent deterioration. This time reflects time  spent by the physician evaluating, providing care and managing the critically ill patient's care. The time was spent at the immediate bedside (or on the same floor/unit and dedicated to this patient's care). Time involved in separately billable procedures is NOT included int he critical care time indicated above. Family meeting and update time may be included above if and only if the patient is unable/incompetent to participate in clinical interview and/or decision making, and the discussion was necessary to determining treatment decisions.   Marcelle Smiling, MD  Board Certified by the ABIM, Pulmonary Diseases & Critical Care Medicine

## 2021-04-02 NOTE — ED Notes (Signed)
ICU at the bedside to transport patient.

## 2021-04-02 NOTE — ED Notes (Signed)
ED TO INPATIENT HANDOFF REPORT  Name/Age/Gender Christian [name] 70 y.o. male  Code Status    Code Status Orders  (From admission, onward)         Start     Ordered   04/02/21 0431  Full code  Continuous        04/02/21 0432        Code Status History    Date Active Date Inactive Code Status Order ID Comments User Context   10/07/2020 1839 10/17/2020 0026 Full Code [id number]  Emeline General, MD ED   03/31/2020 1153 04/01/2020 2332 Full Code [id number]  Lucile Shutters, MD ED   03/17/2020 1548 03/21/2020 1755 Full Code [id number]  Lucile Shutters, MD Inpatient   01/30/2020 1857 02/05/2020 2106 Full Code [id number]  Clapacs, Jackquline Denmark, MD Inpatient   10/31/2019 1840 11/02/2019 1855 Full Code [id number]  Melene Plan, DO ED   08/01/2018 1754 08/04/2018 1200 Full Code [id number]  Gwyneth Sprout, MD ED   06/17/2018 1700 06/23/2018 1808 Full Code [id number]  Lahoma Crocker, MD ED   03/27/2018 0325 03/31/2018 1753 Full Code [id number]  Eduard Clos, MD Inpatient   12/22/2015 1337 02/03/2016 1950 Full Code [id number]  Audery Amel, MD Inpatient   12/22/2015 0830 12/22/2015 1337 Full Code [id number]  Audery Amel, MD ED   09/05/2015 2043 09/18/2015 1817 Full Code [id number]  Clapacs, Jackquline Denmark, MD Inpatient   08/31/2015 1702 09/05/2015 2030 Full Code [id number]  Gracelyn Nurse, MD Inpatient   08/28/2015 2019 08/31/2015 1702 Full Code [id number]  Audery Amel, MD Inpatient   07/29/2015 2028 08/27/2015 1932 Full Code [id number]  Houston Siren, MD Inpatient   07/16/2015 1213 07/29/2015 2028 Full Code [id number]  Shari Prows, MD Inpatient   07/15/2015 1751 07/16/2015 1213 Full Code [id number]  Beau Fanny, MD Inpatient   07/14/2015 1913 07/15/2015 1751 Full Code [id number]  Beau Fanny, MD Inpatient   07/11/2015 1703 07/14/2015 1913 Full Code [id number]  Altamese Dilling, MD Inpatient   07/09/2015 2313 07/11/2015 1703 Full Code [id number]  Dwan Bolt, RN Inpatient   06/28/2015 2306 06/30/2015 1134  Full Code [id number]  Dierdre Forth, PA-C ED   06/21/2014 1533 07/16/2014 1824 Full Code [id number]  [name], NP Inpatient   06/19/2014 1454 06/21/2014 1533 Full Code [id number]  Penny Pia, MD Inpatient   06/17/2014 0545 06/19/2014 1454 Full Code [id number]  Warnell Forester, MD ED   05/25/2014 0455 06/05/2014 1827 Full Code [id number]  Kristeen Mans, NP Inpatient   05/24/2014 0408 05/25/2014 0455 Full Code [id number]  Dammen, Concha Norway ED   Advance Care Planning Activity      Home/SNF/Other Home  Chief Complaint Sepsis [facility]) [A41.9] Septic shock (HCC) [A41.9, R65.21]  Level of Care/Admitting Diagnosis ED Disposition    ED Disposition Condition Comment   Admit  [facility]ospital Area: [facility] [[id number]]  Level of Care: ICU [6]  May admit patient to [facility] or [facility] if equivalent level of care is available:: No  Covid Evaluation: Confirmed COVID Negative  Diagnosis: Septic shock [facility]) [[id number]]  Admitting Physician: Marcelle Smiling [[id number]]  Attending Physician: Marcelle Smiling [[id number]]  Estimated length of stay: > 1 week  Certification:: I certify this patient will need inpatient services for at least 2 midnights       Medical History Past Medical History:  Diagnosis Date  . Bilateral lower extremity edema: chronic with venous stasis changes 06/02/2014  . Bipolar 1 disorder (HCC)   .  Bipolar disorder (HCC)   . CHF (congestive heart failure) (HCC)   . Chronic kidney disease   . Gout   . Gout   . Hx of blood clots   . Hypertension   . Mood swings   . OSA on CPAP 06/02/2014  . Other and unspecified hyperlipidemia 06/02/2014  . Type II or unspecified type diabetes mellitus with unspecified complication, uncontrolled 06/02/2014    Allergies Allergies  Allergen Reactions  . Amlodipine Swelling and Other (See Comments)    Legs became swollen  . Onglyza [Saxagliptin] Other (See Comments)    "Allergic," per [facility]  . Zyprexa [Olanzapine]  Other (See Comments)    Allergy noted by ACT Team. No reaction specified (??)- "Allergic," per [facility]    IV Location/Drains/Wounds Patient Lines/Drains/Airways Status    Active Line/Drains/Airways    Name Placement date Placement time Site Days   Peripheral IV 04/01/21 Left;Posterior Wrist 04/01/21  2000  Wrist  1   CVC Triple Lumen 04/02/21 Right Internal jugular 04/02/21  0120  -- less than 1   Urethral Catheter Larren Copes m, rn Straight-tip;Temperature probe 16 Fr. 04/02/21  0221  Straight-tip;Temperature probe  less than 1   Pressure Injury 08/04/18 Stage II -  Partial thickness loss of dermis presenting as a shallow open ulcer with a red, pink wound bed without slough. 08/04/18  2253  -- 972          Labs/Imaging Results for orders placed or performed during the hospital encounter of 04/01/21 (from the past 48 hour(s))  Comprehensive metabolic panel     Status: Abnormal   Collection Time: 04/01/21  7:47 PM  Result Value Ref Range   Sodium 131 (L) 135 - 145 mmol/L   Potassium 5.3 (H) 3.5 - 5.1 mmol/L   Chloride 100 98 - 111 mmol/L   CO2 20 (L) 22 - 32 mmol/L   Glucose, Bld 206 (H) 70 - 99 mg/dL    Comment: Glucose reference range applies only to samples taken after fasting for at least 8 hours.   BUN 38 (H) 8 - 23 mg/dL   Creatinine, Ser 5.28 (H) 0.61 - 1.24 mg/dL   Calcium 9.1 8.9 - 41.3 mg/dL   Total Protein 7.5 6.5 - 8.1 g/dL   Albumin 3.5 3.5 - 5.0 g/dL   AST 36 15 - 41 U/L   ALT 38 0 - 44 U/L   Alkaline Phosphatase 67 38 - 126 U/L   Total Bilirubin 0.7 0.3 - 1.2 mg/dL   GFR, Estimated 33 (L) >60 mL/min    Comment: (NOTE) Calculated using the CKD-EPI Creatinine Equation (2021)    Anion gap 11 5 - 15    Comment: Performed at [facility], [address], [place], [place] [address]  Lactic acid, plasma     Status: Abnormal   Collection Time: 04/01/21  7:47 PM  Result Value Ref Range   Lactic Acid, Venous 2.1 (HH) 0.5 - 1.9 mmol/L     Comment: CRITICAL RESULT CALLED TO, READ BACK BY AND VERIFIED WITH: SHANNON, RN @ 2056 ON 04/01/21 Riesa Pope Performed at [facility], [address] [place], [place] [address]   CBC with Differential     Status: Abnormal   Collection Time: 04/01/21  7:47 PM  Result Value Ref Range   WBC 16.6 (H) 4.0 - 10.5 K/uL   RBC 5.86 (H) 4.22 - 5.81 MIL/uL   Hemoglobin 14.7 13.0 - 17.0 g/dL  HCT 49.5 39.0 - 52.0 %   MCV 84.5 80.0 - 100.0 fL   MCH 25.1 (L) 26.0 - 34.0 pg   MCHC 29.7 (L) 30.0 - 36.0 g/dL   RDW 47.8 (H) 29.5 - 62.1 %   Platelets 209 150 - 400 K/uL   nRBC 0.0 0.0 - 0.2 %   Neutrophils Relative % 90 %   Neutro Abs 14.9 (H) 1.7 - 7.7 K/uL   Lymphocytes Relative 3 %   Lymphs Abs 0.5 (L) 0.7 - 4.0 K/uL   Monocytes Relative 6 %   Monocytes Absolute 1.0 0.1 - 1.0 K/uL   Eosinophils Relative 0 %   Eosinophils Absolute 0.0 0.0 - 0.5 K/uL   Basophils Relative 0 %   Basophils Absolute 0.0 0.0 - 0.1 K/uL   Immature Granulocytes 1 %   Abs Immature Granulocytes 0.10 (H) 0.00 - 0.07 K/uL    Comment: Performed at [facility], [address], [place], [place] [address]  Troponin I (High Sensitivity)     Status: Abnormal   Collection Time: 04/01/21  8:29 PM  Result Value Ref Range   Troponin I (High Sensitivity) 49 (H) <18 ng/L    Comment: (NOTE) Elevated high sensitivity troponin I (hsTnI) values and significant  changes across serial measurements may suggest ACS but many other  chronic and acute conditions are known to elevate hsTnI results.  Refer to the "Links" section for chest pain algorithms and additional  guidance. Performed at [facility], [address] [place], [place] [address]   Resp Panel by RT-PCR (Flu A&B, Covid) Nasopharyngeal Swab     Status: None   Collection Time: 04/01/21  8:29 PM   Specimen: Nasopharyngeal Swab; Nasopharyngeal(NP) swabs in vial transport medium  Result Value Ref Range   SARS Coronavirus  2 by RT PCR NEGATIVE NEGATIVE    Comment: (NOTE) SARS-CoV-2 target nucleic acids are NOT DETECTED.  The SARS-CoV-2 RNA is generally detectable in upper respiratory specimens during the acute phase of infection. The lowest concentration of SARS-CoV-2 viral copies this assay can detect is 138 copies/mL. A negative result does not preclude SARS-Cov-2 infection and should not be used as the sole basis for treatment or other patient management decisions. A negative result may occur with  improper specimen collection/handling, submission of specimen other than nasopharyngeal swab, presence of viral mutation(s) within the areas targeted by this assay, and inadequate number of viral copies(<138 copies/mL). A negative result must be combined with clinical observations, patient history, and epidemiological information. The expected result is Negative.  Fact Sheet for Patients:  BloggerCourse.com  Fact Sheet for Healthcare Providers:  SeriousBroker.it  This test is no t yet approved or cleared by the [place] FDA and  has been authorized for detection and/or diagnosis of SARS-CoV-2 by FDA under an Emergency Use Authorization (EUA). This EUA will remain  in effect (meaning this test can be used) for the duration of the COVID-19 declaration under Section 564(b)(1) of the Act, 21 U.S.C.section 360bbb-3(b)(1), unless the authorization is terminated  or revoked sooner.       Influenza A by PCR NEGATIVE NEGATIVE   Influenza B by PCR NEGATIVE NEGATIVE    Comment: (NOTE) The Xpert Xpress SARS-CoV-2/FLU/RSV plus assay is intended as an aid in the diagnosis of influenza from Nasopharyngeal swab specimens and should not be used as a sole basis for treatment. Nasal washings and aspirates are unacceptable for Xpert Xpress SARS-CoV-2/FLU/RSV testing.  Fact Sheet for Patients: BloggerCourse.com  Fact Sheet for Healthcare  Providers: SeriousBroker.it  This test is not yet approved or cleared by the [place] FDA and has been authorized for detection and/or diagnosis of SARS-CoV-2 by FDA under an Emergency Use Authorization (EUA). This EUA will remain in effect (meaning this test can be used) for the duration of the COVID-19 declaration under Section 564(b)(1) of the Act, 21 U.S.C. section 360bbb-3(b)(1), unless the authorization is terminated or revoked.  Performed at [facility], [address] [place], [place] [address]   Protime-INR     Status: Abnormal   Collection Time: 04/01/21  8:29 PM  Result Value Ref Range   Prothrombin Time 19.1 (H) 11.4 - 15.2 seconds   INR 1.6 (H) 0.8 - 1.2    Comment: (NOTE) INR goal varies based on device and disease states. Performed at [facility], [address], [place], [place] [address]   Urinalysis, Routine w reflex microscopic     Status: Abnormal   Collection Time: 04/01/21 10:00 PM  Result Value Ref Range   Color, Urine YELLOW YELLOW   APPearance CLEAR CLEAR   Specific Gravity, Urine 1.013 1.005 - 1.030   pH 7.0 5.0 - 8.0   Glucose, UA >=500 (A) NEGATIVE mg/dL   Hgb urine dipstick NEGATIVE NEGATIVE   Bilirubin Urine NEGATIVE NEGATIVE   Ketones, ur NEGATIVE NEGATIVE mg/dL   Protein, ur NEGATIVE NEGATIVE mg/dL   Nitrite NEGATIVE NEGATIVE   Leukocytes,Ua NEGATIVE NEGATIVE   RBC / HPF 0-5 0 - 5 RBC/hpf   WBC, UA 0-5 0 - 5 WBC/hpf   Bacteria, UA RARE (A) NONE SEEN   Squamous Epithelial / LPF 0-5 0 - 5    Comment: Performed at [facility], [address], [place], [place] [address]  Lactic acid, plasma     Status: Abnormal   Collection Time: 04/01/21 10:08 PM  Result Value Ref Range   Lactic Acid, Venous 2.6 (HH) 0.5 - 1.9 mmol/L    Comment: CRITICAL VALUE NOTED.  VALUE IS CONSISTENT WITH PREVIOUSLY REPORTED AND CALLED VALUE. Performed at [facility], [address], [place], [place] [address]   TSH     Status: None   Collection Time: 04/02/21 12:55 AM  Result Value Ref Range   TSH 0.519 0.350 - 4.500 uIU/mL    Comment: Performed by a 3rd Generation assay with a functional sensitivity of <=0.01 uIU/mL. Performed at [facility], [address], [place], [place] [address]   Blood gas, arterial (at [facility])     Status: None   Collection Time: 04/02/21  1:30 AM  Result Value Ref Range   FIO2 21.00    pH, Arterial 7.402 7.350 - 7.450   pCO2 arterial 37.7 32.0 - 48.0 mmHg   pO2, Arterial 94.0 83.0 - 108.0 mmHg   Bicarbonate 22.6 20.0 - 28.0 mmol/L   Acid-base deficit 0.9 0.0 - 2.0 mmol/L   O2 Saturation 96.6 %   Patient temperature 101.2    Allens test (pass/fail) PASS PASS    Comment: Performed at [facility], [address] [place], [place] [address]  Lactic acid, plasma     Status: None   Collection Time: 04/02/21  4:55 AM  Result Value Ref Range   Lactic Acid, Venous 1.1 0.5 - 1.9 mmol/L    Comment: Performed at [facility], [address], [place], [place] [address]  CBC     Status: Abnormal  Collection Time: 04/02/21  4:55 AM  Result Value Ref Range   WBC 14.7 (H) 4.0 - 10.5 K/uL   RBC 4.98 4.22 - 5.81 MIL/uL   Hemoglobin 12.7 (L) 13.0 - 17.0 g/dL   HCT 16.1 09.6 - 04.5 %   MCV 83.1 80.0 - 100.0 fL   MCH 25.5 (L) 26.0 - 34.0 pg   MCHC 30.7 30.0 - 36.0 g/dL   RDW 40.9 (H) 81.1 - 91.4 %   Platelets 181 150 - 400 K/uL   nRBC 0.0 0.0 - 0.2 %    Comment: Performed at [facility], [address], [place], [place] [address]  Basic metabolic panel     Status: Abnormal   Collection Time: 04/02/21  4:55 AM  Result Value Ref Range   Sodium 138 135 - 145 mmol/L    Comment: DELTA CHECK NOTED   Potassium 4.7 3.5 - 5.1 mmol/L   Chloride 107 98 - 111 mmol/L   CO2 22 22 - 32 mmol/L   Glucose, Bld 109 (H) 70 - 99 mg/dL    Comment: Glucose  reference range applies only to samples taken after fasting for at least 8 hours.   BUN 35 (H) 8 - 23 mg/dL   Creatinine, Ser 6.21 (H) 0.61 - 1.24 mg/dL   Calcium 8.7 (L) 8.9 - 10.3 mg/dL   GFR, Estimated 39 (L) >60 mL/min    Comment: (NOTE) Calculated using the CKD-EPI Creatinine Equation (2021)    Anion gap 9 5 - 15    Comment: Performed at [facility], [address], [place], [place] [address]  Magnesium     Status: None   Collection Time: 04/02/21  4:55 AM  Result Value Ref Range   Magnesium 2.0 1.7 - 2.4 mg/dL    Comment: Performed at [facility], [address], [place], [place] [address]  CBG monitoring, ED     Status: Abnormal   Collection Time: 04/02/21  5:04 AM  Result Value Ref Range   Glucose-Capillary 119 (H) 70 - 99 mg/dL    Comment: Glucose reference range applies only to samples taken after fasting for at least 8 hours.   Comment 1 Notify RN    Comment 2 Document in Chart    CT Head Wo Contrast  Result Date: 04/02/2021 CLINICAL DATA:  Delirium EXAM: CT HEAD WITHOUT CONTRAST TECHNIQUE: Contiguous axial images were obtained from the base of the skull through the vertex without intravenous contrast. COMPARISON:  None. FINDINGS: Brain: There is no mass, hemorrhage or extra-axial collection. The size and configuration of the ventricles and extra-axial CSF spaces are normal. The brain parenchyma is normal, without acute or chronic infarction. Vascular: No abnormal hyperdensity of the major intracranial arteries or dural venous sinuses. No intracranial atherosclerosis. Skull: The visualized skull base, calvarium and extracranial soft tissues are normal. Sinuses/Orbits: No fluid levels or advanced mucosal thickening of the visualized paranasal sinuses. No mastoid or middle ear effusion. The orbits are normal. IMPRESSION: Normal head CT. Electronically Signed   By: Deatra Robinson M.D.   On: 04/02/2021 01:43   CT CHEST ABDOMEN PELVIS W  CONTRAST  Result Date: 04/02/2021 CLINICAL DATA:  Dysuria.  Sepsis. EXAM: CT CHEST, ABDOMEN, AND PELVIS WITH CONTRAST TECHNIQUE: Multidetector CT imaging of the chest, abdomen and pelvis was performed following the standard protocol during bolus administration of intravenous contrast. CONTRAST:  70mL OMNIPAQUE IOHEXOL 300 MG/ML  SOLN COMPARISON:  08/29/2018 FINDINGS: CT CHEST FINDINGS Cardiovascular: Coronary artery  and aortic atherosclerosis. Normal heart size. Normal aortic caliber. Mediastinum/Nodes: No enlarged mediastinal, hilar, or axillary lymph nodes. Thyroid gland, trachea, and esophagus demonstrate no significant findings. Lungs/Pleura: Lungs are clear. No pleural effusion or pneumothorax. Musculoskeletal: No chest wall mass or suspicious bone lesions identified. CT ABDOMEN PELVIS FINDINGS Hepatobiliary: No focal liver abnormality is seen. No gallstones, gallbladder wall thickening, or biliary dilatation. Pancreas: Unremarkable. No pancreatic ductal dilatation or surrounding inflammatory changes. Spleen: Normal in size without focal abnormality. Adrenals/Urinary Tract: Adrenal glands are unremarkable. Kidneys are normal, without renal calculi, focal lesion, or hydronephrosis. Bladder is unremarkable. Unchanged left renal sinus cyst. Stomach/Bowel: Stomach is within normal limits. Appendix appears normal. No evidence of bowel wall thickening, distention, or inflammatory changes. Vascular/Lymphatic: Aortic atherosclerosis. No enlarged abdominal or pelvic lymph nodes. Reproductive: Prostate is unremarkable. Other: No abdominal wall hernia or abnormality. No abdominopelvic ascites. Musculoskeletal: No acute or significant osseous findings. IMPRESSION: 1. No acute abnormality of the chest, abdomen or pelvis. 2. Coronary artery and aortic Atherosclerosis (ICD10-I70.0). Electronically Signed   By: Deatra RobinsonKevin  Herman M.D.   On: 04/02/2021 02:12   DG Chest Portable 1 View  Result Date: 04/02/2021 CLINICAL DATA:   Central line placement. EXAM: PORTABLE CHEST 1 VIEW COMPARISON:  04/01/2021. FINDINGS: Right IJ line noted with tip the cavoatrial junction. Heart size normal. Low lung volumes with bibasilar atelectasis. Left base infiltrate. No pleural effusion or pneumothorax. Carotid vascular calcification. IMPRESSION: 1. Right IJ line noted with tip at cavoatrial junction. No pneumothorax. 2. Low lung volumes with bibasilar atelectasis. Left base infiltrate noted. 3.  Carotid vascular disease. Electronically Signed   By: Maisie Fushomas  Register   On: 04/02/2021 05:50   DG Chest Port 1 View  Result Date: 04/01/2021 CLINICAL DATA:  Altered mental status. EXAM: PORTABLE CHEST 1 VIEW COMPARISON:  Single-view of the chest 10/07/2020. FINDINGS: Lungs clear. Heart size normal. No pneumothorax or pleural fluid. No bony abnormality. IMPRESSION: Negative chest. Electronically Signed   By: Drusilla Kannerhomas  Dalessio M.D.   On: 04/01/2021 19:57    Pending Labs Unresulted Labs (From admission, onward)          Start     Ordered   04/02/21 0431  Brain natriuretic peptide  Once,   STAT        04/02/21 0432   04/02/21 0429  HIV Antibody (routine testing w rflx)  (HIV Antibody (Routine testing w reflex) panel)  Once,   STAT        04/02/21 0432   04/02/21 0408  Lactic acid, plasma  STAT Now then every 3 hours,   R (with STAT occurrences)      04/02/21 0408   04/02/21 0056  Cortisol, Random  Once,   STAT        04/02/21 0055   04/01/21 1831  Culture, blood (Routine x 2)  BLOOD CULTURE X 2,   STAT      04/01/21 1830          Vitals/Pain Today's Vitals   04/02/21 0600 04/02/21 0630 04/02/21 0645 04/02/21 0700  BP: 108/78 99/63 106/70 100/65  Pulse: 85 74 77 72  Resp: 15 13    Temp: 99.2 F (37.3 C) 98.9 F (37.2 C) 98.6 F (37 C) 98.6 F (37 C)  TempSrc:      SpO2: 95% 94% 94% 95%  Weight:      Height:      PainSc:        Isolation Precautions No active isolations  Medications Medications  docusate sodium (COLACE)  capsule 100 mg (has no administration in time range)  polyethylene glycol (MIRALAX / GLYCOLAX) packet 17 g (has no administration in time range)  insulin aspart (novoLOG) injection 0-9 Units (0 Units Subcutaneous Not Given 04/02/21 0506)  ondansetron (ZOFRAN) injection 4 mg (has no administration in time range)  pantoprazole (PROTONIX) injection 40 mg (has no administration in time range)  0.9 %  sodium chloride infusion (250 mLs Intravenous New Bag/Given 04/02/21 0611)  norepinephrine (LEVOPHED) 4mg  in premix infusion (0 mcg/min Intravenous Hold 04/02/21 0448)  rivaroxaban (XARELTO) tablet 20 mg (has no administration in time range)  acetaminophen (TYLENOL) tablet 650 mg (has no administration in time range)  lactated ringers bolus 2,000 mL (0 mLs Intravenous Stopped 04/01/21 2252)  cefTRIAXone (ROCEPHIN) 2 g in sodium chloride 0.9 % 100 mL IVPB (0 g Intravenous Stopped 04/02/21 0023)  0.9 %  sodium chloride infusion ( Intravenous Stopped 04/02/21 0610)  acetaminophen (TYLENOL) tablet 650 mg (650 mg Oral Given 04/01/21 2252)  vancomycin (VANCOREADY) IVPB 2000 mg/400 mL (0 mg Intravenous Stopped 04/02/21 0240)  sodium chloride 0.9 % bolus 1,000 mL (0 mLs Intravenous Stopped 04/02/21 0240)  dexamethasone (DECADRON) injection 8 mg (8 mg Intravenous Given 04/02/21 0230)  iohexol (OMNIPAQUE) 300 MG/ML solution 100 mL (70 mLs Intravenous Contrast Given 04/02/21 0109)  ibuprofen (ADVIL) tablet 600 mg (600 mg Oral Given 04/02/21 0230)    Mobility Has not ambulated in ED

## 2021-04-02 NOTE — Progress Notes (Signed)
eLink Physician-Brief Progress Note Patient Name: Christian [name] DOB: September 26, 1951 MRN: [id number]   Date of Service  04/02/2021  HPI/Events of Note  Patient admitted through the ED with  Fever, altered mental status and hypotension suggestive of septic shock. Work up for focus of infection is in progress, with a UTI being  a possible source.  eICU Interventions  New Patient Evaluation.        Migdalia Dk 04/02/2021, 6:47 AM

## 2021-04-03 ENCOUNTER — Inpatient Hospital Stay (HOSPITAL_COMMUNITY): Payer: Medicare PPO

## 2021-04-03 DIAGNOSIS — R6520 Severe sepsis without septic shock: Secondary | ICD-10-CM

## 2021-04-03 DIAGNOSIS — N179 Acute kidney failure, unspecified: Secondary | ICD-10-CM

## 2021-04-03 DIAGNOSIS — I5031 Acute diastolic (congestive) heart failure: Secondary | ICD-10-CM | POA: Diagnosis not present

## 2021-04-03 LAB — COMPREHENSIVE METABOLIC PANEL
ALT: 29 U/L (ref 0–44)
AST: 23 U/L (ref 15–41)
Albumin: 3.1 g/dL — ABNORMAL LOW (ref 3.5–5.0)
Alkaline Phosphatase: 55 U/L (ref 38–126)
Anion gap: 10 (ref 5–15)
BUN: 37 mg/dL — ABNORMAL HIGH (ref 8–23)
CO2: 20 mmol/L — ABNORMAL LOW (ref 22–32)
Calcium: 9.4 mg/dL (ref 8.9–10.3)
Chloride: 109 mmol/L (ref 98–111)
Creatinine, Ser: 1.71 mg/dL — ABNORMAL HIGH (ref 0.61–1.24)
GFR, Estimated: 43 mL/min — ABNORMAL LOW (ref >60)
Glucose, Bld: 108 mg/dL — ABNORMAL HIGH (ref 70–99)
Potassium: 4.5 mmol/L (ref 3.5–5.1)
Sodium: 139 mmol/L (ref 135–145)
Total Bilirubin: 0.1 mg/dL — ABNORMAL LOW (ref 0.3–1.2)
Total Protein: 6.7 g/dL (ref 6.5–8.1)

## 2021-04-03 LAB — PHOSPHORUS: Phosphorus: 2.4 mg/dL — ABNORMAL LOW (ref 2.5–4.6)

## 2021-04-03 LAB — ECHOCARDIOGRAM COMPLETE
AR max vel: 2.13 cm2
AV Area VTI: 2.33 cm2
AV Area mean vel: 1.93 cm2
AV Mean grad: 5 mmHg
AV Peak grad: 8.3 mmHg
Ao pk vel: 1.44 m/s
Area-P 1/2: 2.91 cm2
Calc EF: 56.5 %
Height: 72 in
Single Plane A2C EF: 55.4 %
Single Plane A4C EF: 57.3 %
Weight: 4134.07 oz

## 2021-04-03 LAB — CBC WITH DIFFERENTIAL/PLATELET
Abs Immature Granulocytes: 0.04 10*3/uL (ref 0.00–0.07)
Basophils Absolute: 0 10*3/uL (ref 0.0–0.1)
Basophils Relative: 0 %
Eosinophils Absolute: 0 10*3/uL (ref 0.0–0.5)
Eosinophils Relative: 0 %
HCT: 43.3 % (ref 39.0–52.0)
Hemoglobin: 13.1 g/dL (ref 13.0–17.0)
Immature Granulocytes: 0 %
Lymphocytes Relative: 9 %
Lymphs Abs: 0.9 10*3/uL (ref 0.7–4.0)
MCH: 25.3 pg — ABNORMAL LOW (ref 26.0–34.0)
MCHC: 30.3 g/dL (ref 30.0–36.0)
MCV: 83.8 fL (ref 80.0–100.0)
Monocytes Absolute: 0.5 10*3/uL (ref 0.1–1.0)
Monocytes Relative: 5 %
Neutro Abs: 8.2 10*3/uL — ABNORMAL HIGH (ref 1.7–7.7)
Neutrophils Relative %: 86 %
Platelets: 178 10*3/uL (ref 150–400)
RBC: 5.17 MIL/uL (ref 4.22–5.81)
RDW: 16.1 % — ABNORMAL HIGH (ref 11.5–15.5)
WBC: 9.6 10*3/uL (ref 4.0–10.5)
nRBC: 0 % (ref 0.0–0.2)

## 2021-04-03 LAB — GLUCOSE, CAPILLARY
Glucose-Capillary: 109 mg/dL — ABNORMAL HIGH (ref 70–99)
Glucose-Capillary: 110 mg/dL — ABNORMAL HIGH (ref 70–99)
Glucose-Capillary: 110 mg/dL — ABNORMAL HIGH (ref 70–99)
Glucose-Capillary: 111 mg/dL — ABNORMAL HIGH (ref 70–99)
Glucose-Capillary: 128 mg/dL — ABNORMAL HIGH (ref 70–99)
Glucose-Capillary: 98 mg/dL (ref 70–99)

## 2021-04-03 LAB — HEMOGLOBIN A1C
Hgb A1c MFr Bld: 7.7 % — ABNORMAL HIGH (ref 4.8–5.6)
Mean Plasma Glucose: 174.29 mg/dL

## 2021-04-03 LAB — MAGNESIUM: Magnesium: 2 mg/dL (ref 1.7–2.4)

## 2021-04-03 MED ORDER — PERFLUTREN LIPID MICROSPHERE
1.0000 mL | INTRAVENOUS | Status: AC | PRN
  Administered 2021-04-03: 2 mL via INTRAVENOUS

## 2021-04-03 MED ORDER — FUROSEMIDE 10 MG/ML IJ SOLN
60.0000 mg | Freq: Two times a day (BID) | INTRAMUSCULAR | Status: DC
  Administered 2021-04-03 (×2): 60 mg via INTRAVENOUS
  Filled 2021-04-03 (×2): qty 6

## 2021-04-03 MED ORDER — K PHOS MONO-SOD PHOS DI & MONO 155-852-130 MG PO TABS
500.0000 mg | ORAL_TABLET | Freq: Three times a day (TID) | ORAL | Status: AC
  Administered 2021-04-03 (×3): 500 mg via ORAL
  Filled 2021-04-03 (×3): qty 2

## 2021-04-03 MED ORDER — SODIUM CHLORIDE 0.9 % IV SOLN
2.0000 g | INTRAVENOUS | Status: DC
  Administered 2021-04-03: 2 g via INTRAVENOUS
  Filled 2021-04-03: qty 2

## 2021-04-03 NOTE — Progress Notes (Signed)
  Echocardiogram 2D Echocardiogramwith contrast has been performed.  Christian [name] F 04/03/2021, 1:37 PM

## 2021-04-03 NOTE — Progress Notes (Signed)
PROGRESS NOTE    Christian PeekCharles L [name]  ZOX:[id number] DOB: 09/10/1951 DOA: 04/01/2021 PCP: Deatra JamesSun, Vyvyan, MD   Chief Complain: Altered mental status  Brief Narrative: Patient is a 70 year old African-American male with history of CHF, chronic renal disease stage 3b, gout, hypertension, OSA on CPAP, hyperlipidemia, type 2 diabetes mellitus, bipolar 1 disorder, DVT who presented to the emergency department with complaints of altered mental status.  Also reported dysuria and bilateral lower EXTR swelling on  presentation.  History of noncompliance and not taking medication.  On presentation, initially was hypotensive, febrile.  Septic shock was suspected, had to be started on vasopressors and he was admitted to ICU.  Started on IV antibiotics. [facility]  Assessment & Plan:   Active Problems:   Type 2 diabetes mellitus with complication, without long-term current use of insulin (HCC)   Bipolar affective disorder, depressed, mild (HCC)   Sepsis (HCC)   Lactic acidosis   Hyponatremia   Hyperkalemia   Chronic kidney disease, stage 3b (HCC)   Fever   Septic shock (HCC)   Severe sepsis/suspected septic shock: Presented with hypotension.  Started on pressors on admission.  Source was thought to be UTI initially and was started on ceftriaxone.  Source is unclear follow-up cultures, currently afebrile and hemodynamically stable.  Urine culture was never sent.  Lactic acidosis  Has  resolved.  Leukocytosis resolved. Chest x-ray done on 04/02/21 showed possible left lower infiltrate but patient does not have any signs of pneumonia.  Continue ceftriaxone for now.  AKI on CKD stage IIIb: Currently kidney function close at baseline. Foley placed on presentation now removed. IV fluids stopped.  Patient looks severely volume overloaded.  Started on Lasix  Acute on chronic diastolic congestive heart failure:Echo on 09/2020 showed ejection fraction of 65 to 70%, grade 1 diastolic  dysfunction.  Patient looks volume overloaded.  BNP awaited.  We will get new echocardiogram.  Continue Lasix.  Monitor input/output, daily weight  Altered mental status: Most likely secondary to metabolic encephalopathy from sepsis.  Currently he is alert and oriented  Hyperkalemia: Resolved  History of DVT: On Xarelto  Hypophosphatemia: Being supplemented  Diabetes mellitus: Continue sliding scale insulin.  Monitor blood sugars.  Will check hemoglobin A1c level.  Generalized weakness/debility/deconditioning: PT/OT consulted         DVT prophylaxis: Xarelto Code Status: Full code Family Communication: None present at the bedside Status is: Inpatient  Remains inpatient appropriate because:Inpatient level of care appropriate due to severity of illness   Dispo: The patient is from: Home              Anticipated d/c is to: Home              Patient currently is not medically stable to d/c.   Difficult to place patient No     Consultants: None  Procedures:None  Antimicrobials:  Anti-infectives (From admission, onward)   Start     Dose/Rate Route Frequency Ordered Stop   04/02/21 2200  cefTRIAXone (ROCEPHIN) 2 g in sodium chloride 0.9 % 100 mL IVPB        2 g 200 mL/hr over 30 Minutes Intravenous  Once 04/02/21 1645 04/02/21 2206   04/01/21 2300  vancomycin (VANCOREADY) IVPB 2000 mg/400 mL        2,000 mg 200 mL/hr over 120 Minutes Intravenous  Once 04/01/21 2249 04/02/21 0240   04/01/21 2230  cefTRIAXone (ROCEPHIN) 2 g in sodium chloride 0.9 % 100 mL  IVPB        2 g 200 mL/hr over 30 Minutes Intravenous  Once 04/01/21 2227 04/02/21 0023      Subjective: Patient seen and examined the bedside this morning.  Currently alert oriented.  Hemodynamically stable.  Looks volume overloaded.  Any chest pain, cough or shortness of breath.  Poor historian, looks deconditioned, debilitated  Objective: Vitals:   04/03/21 0300 04/03/21 0400 04/03/21 0446 04/03/21 0451  BP:  123/73 108/65  135/76  Pulse: 79   77  Resp: 18 20  20   Temp:    98.9 F (37.2 C)  TempSrc:    Oral  SpO2: 96% 97%  95%  Weight:   117.2 kg   Height:   6' (1.829 m)     Intake/Output Summary (Last 24 hours) at 04/03/2021 0758 Last data filed at 04/03/2021 0300 Gross per 24 hour  Intake 430 ml  Output 1850 ml  Net -1420 ml   Filed Weights   04/01/21 1923 04/02/21 0800 04/03/21 0446  Weight: 120.2 kg 117.8 kg 117.2 kg    Examination:  General exam: Not in distress, morbidly obese, chronically looking, weak HEENT:PERRL,Oral mucosa moist, Ear/Nose normal on gross exam Respiratory system: Bilateral diminished air entry on bilateral lower lungs, no wheezes or crackles  Cardiovascular system: S1 & S2 heard, RRR. No JVD, murmurs, rubs, gallops or clicks.  Gastrointestinal system: Abdomen is nondistended, soft and nontender. No organomegaly or masses felt. Normal bowel sounds heard. Central nervous system: Alert and oriented. No focal neurological deficits. Extremities: 2-3+ bilateral lower extremity pitting edema, no clubbing ,no cyanosis, distal peripheral pulses palpable. Skin: No rashes, lesions or ulcers,no icterus ,no pallor   Data Reviewed: I have personally reviewed following labs and imaging studies  CBC: Recent Labs  Lab 04/01/21 1947 04/02/21 0455 04/03/21 0246  WBC 16.6* 14.7* 9.6  NEUTROABS 14.9*  --  8.2*  HGB 14.7 12.7* 13.1  HCT 49.5 41.4 43.3  MCV 84.5 83.1 83.8  PLT 209 181 178   Basic Metabolic Panel: Recent Labs  Lab 04/01/21 1947 04/02/21 0455 04/03/21 0246  NA 131* 138 139  K 5.3* 4.7 4.5  CL 100 107 109  CO2 20* 22 20*  GLUCOSE 206* 109* 108*  BUN 38* 35* 37*  CREATININE 2.13* 1.88* 1.71*  CALCIUM 9.1 8.7* 9.4  MG  --  2.0 2.0  PHOS  --   --  2.4*   GFR: Estimated Creatinine Clearance: 55.4 mL/min (A) (by C-G formula based on SCr of 1.71 mg/dL (H)). Liver Function Tests: Recent Labs  Lab 04/01/21 1947 04/03/21 0246  AST 36 23  ALT  38 29  ALKPHOS 67 55  BILITOT 0.7 0.1*  PROT 7.5 6.7  ALBUMIN 3.5 3.1*   No results for input(s): LIPASE, AMYLASE in the last 168 hours. No results for input(s): AMMONIA in the last 168 hours. Coagulation Profile: Recent Labs  Lab 04/01/21 2029  INR 1.6*   Cardiac Enzymes: No results for input(s): CKTOTAL, CKMB, CKMBINDEX, TROPONINI in the last 168 hours. BNP (last 3 results) No results for input(s): PROBNP in the last 8760 hours. HbA1C: No results for input(s): HGBA1C in the last 72 hours. CBG: Recent Labs  Lab 04/02/21 1135 04/02/21 1609 04/02/21 2012 04/03/21 0025 04/03/21 0459  GLUCAP 133* 148* 157* 98 111*   Lipid Profile: No results for input(s): CHOL, HDL, LDLCALC, TRIG, CHOLHDL, LDLDIRECT in the last 72 hours. Thyroid Function Tests: Recent Labs    04/02/21 0055  TSH 0.519  Anemia Panel: No results for input(s): VITAMINB12, FOLATE, FERRITIN, TIBC, IRON, RETICCTPCT in the last 72 hours. Sepsis Labs: Recent Labs  Lab 04/01/21 1947 04/01/21 2208 04/02/21 0455 04/02/21 0808  LATICACIDVEN 2.1* 2.6* 1.1 0.9    Recent Results (from the past 240 hour(s))  Resp Panel by RT-PCR (Flu A&B, Covid) Nasopharyngeal Swab     Status: None   Collection Time: 04/01/21  8:29 PM   Specimen: Nasopharyngeal Swab; Nasopharyngeal(NP) swabs in vial transport medium  Result Value Ref Range Status   SARS Coronavirus 2 by RT PCR NEGATIVE NEGATIVE Final    Comment: (NOTE) SARS-CoV-2 target nucleic acids are NOT DETECTED.  The SARS-CoV-2 RNA is generally detectable in upper respiratory specimens during the acute phase of infection. The lowest concentration of SARS-CoV-2 viral copies this assay can detect is 138 copies/mL. A negative result does not preclude SARS-Cov-2 infection and should not be used as the sole basis for treatment or other patient management decisions. A negative result may occur with  improper specimen collection/handling, submission of specimen  other than nasopharyngeal swab, presence of viral mutation(s) within the areas targeted by this assay, and inadequate number of viral copies(<138 copies/mL). A negative result must be combined with clinical observations, patient history, and epidemiological information. The expected result is Negative.  Fact Sheet for Patients:  BloggerCourse.com  Fact Sheet for Healthcare Providers:  SeriousBroker.it  This test is no t yet approved or cleared by the [place] FDA and  has been authorized for detection and/or diagnosis of SARS-CoV-2 by FDA under an Emergency Use Authorization (EUA). This EUA will remain  in effect (meaning this test can be used) for the duration of the COVID-19 declaration under Section 564(b)(1) of the Act, 21 U.S.C.section 360bbb-3(b)(1), unless the authorization is terminated  or revoked sooner.       Influenza A by PCR NEGATIVE NEGATIVE Final   Influenza B by PCR NEGATIVE NEGATIVE Final    Comment: (NOTE) The Xpert Xpress SARS-CoV-2/FLU/RSV plus assay is intended as an aid in the diagnosis of influenza from Nasopharyngeal swab specimens and should not be used as a sole basis for treatment. Nasal washings and aspirates are unacceptable for Xpert Xpress SARS-CoV-2/FLU/RSV testing.  Fact Sheet for Patients: BloggerCourse.com  Fact Sheet for Healthcare Providers: SeriousBroker.it  This test is not yet approved or cleared by the [place] FDA and has been authorized for detection and/or diagnosis of SARS-CoV-2 by FDA under an Emergency Use Authorization (EUA). This EUA will remain in effect (meaning this test can be used) for the duration of the COVID-19 declaration under Section 564(b)(1) of the Act, 21 U.S.C. section 360bbb-3(b)(1), unless the authorization is terminated or revoked.  Performed at [facility], [address], [place], [place] [address]   MRSA PCR Screening     Status: None   Collection Time: 04/02/21  7:48 AM   Specimen: Nasal Mucosa; Nasopharyngeal  Result Value Ref Range Status   MRSA by PCR NEGATIVE NEGATIVE Final    Comment:        The GeneXpert MRSA Assay (FDA approved for NASAL specimens only), is one component of a comprehensive MRSA colonization surveillance program. It is not intended to diagnose MRSA infection nor to guide or monitor treatment for MRSA infections. Performed at [facility], [address] [place], [place] [address]          Radiology Studies: CT Head Wo Contrast  Result Date: 04/02/2021 CLINICAL DATA:  Delirium EXAM: CT HEAD WITHOUT  CONTRAST TECHNIQUE: Contiguous axial images were obtained from the base of the skull through the vertex without intravenous contrast. COMPARISON:  None. FINDINGS: Brain: There is no mass, hemorrhage or extra-axial collection. The size and configuration of the ventricles and extra-axial CSF spaces are normal. The brain parenchyma is normal, without acute or chronic infarction. Vascular: No abnormal hyperdensity of the major intracranial arteries or dural venous sinuses. No intracranial atherosclerosis. Skull: The visualized skull base, calvarium and extracranial soft tissues are normal. Sinuses/Orbits: No fluid levels or advanced mucosal thickening of the visualized paranasal sinuses. No mastoid or middle ear effusion. The orbits are normal. IMPRESSION: Normal head CT. Electronically Signed   By: Deatra Robinson M.D.   On: 04/02/2021 01:43   CT CHEST ABDOMEN PELVIS W CONTRAST  Result Date: 04/02/2021 CLINICAL DATA:  Dysuria.  Sepsis. EXAM: CT CHEST, ABDOMEN, AND PELVIS WITH CONTRAST TECHNIQUE: Multidetector CT imaging of the chest, abdomen and pelvis was performed following the standard protocol during bolus administration of intravenous contrast. CONTRAST:  43mL OMNIPAQUE IOHEXOL 300 MG/ML  SOLN COMPARISON:   08/29/2018 FINDINGS: CT CHEST FINDINGS Cardiovascular: Coronary artery and aortic atherosclerosis. Normal heart size. Normal aortic caliber. Mediastinum/Nodes: No enlarged mediastinal, hilar, or axillary lymph nodes. Thyroid gland, trachea, and esophagus demonstrate no significant findings. Lungs/Pleura: Lungs are clear. No pleural effusion or pneumothorax. Musculoskeletal: No chest wall mass or suspicious bone lesions identified. CT ABDOMEN PELVIS FINDINGS Hepatobiliary: No focal liver abnormality is seen. No gallstones, gallbladder wall thickening, or biliary dilatation. Pancreas: Unremarkable. No pancreatic ductal dilatation or surrounding inflammatory changes. Spleen: Normal in size without focal abnormality. Adrenals/Urinary Tract: Adrenal glands are unremarkable. Kidneys are normal, without renal calculi, focal lesion, or hydronephrosis. Bladder is unremarkable. Unchanged left renal sinus cyst. Stomach/Bowel: Stomach is within normal limits. Appendix appears normal. No evidence of bowel wall thickening, distention, or inflammatory changes. Vascular/Lymphatic: Aortic atherosclerosis. No enlarged abdominal or pelvic lymph nodes. Reproductive: Prostate is unremarkable. Other: No abdominal wall hernia or abnormality. No abdominopelvic ascites. Musculoskeletal: No acute or significant osseous findings. IMPRESSION: 1. No acute abnormality of the chest, abdomen or pelvis. 2. Coronary artery and aortic Atherosclerosis (ICD10-I70.0). Electronically Signed   By: Deatra Robinson M.D.   On: 04/02/2021 02:12   DG Chest Portable 1 View  Result Date: 04/02/2021 CLINICAL DATA:  Central line placement. EXAM: PORTABLE CHEST 1 VIEW COMPARISON:  04/01/2021. FINDINGS: Right IJ line noted with tip the cavoatrial junction. Heart size normal. Low lung volumes with bibasilar atelectasis. Left base infiltrate. No pleural effusion or pneumothorax. Carotid vascular calcification. IMPRESSION: 1. Right IJ line noted with tip at cavoatrial  junction. No pneumothorax. 2. Low lung volumes with bibasilar atelectasis. Left base infiltrate noted. 3.  Carotid vascular disease. Electronically Signed   By: Maisie Fus  Register   On: 04/02/2021 05:50   DG Chest Port 1 View  Result Date: 04/01/2021 CLINICAL DATA:  Altered mental status. EXAM: PORTABLE CHEST 1 VIEW COMPARISON:  Single-view of the chest 10/07/2020. FINDINGS: Lungs clear. Heart size normal. No pneumothorax or pleural fluid. No bony abnormality. IMPRESSION: Negative chest. Electronically Signed   By: Drusilla Kanner M.D.   On: 04/01/2021 19:57        Scheduled Meds: . Chlorhexidine Gluconate Cloth  6 each Topical Daily  . insulin aspart  0-9 Units Subcutaneous Q4H  . pantoprazole (PROTONIX) IV  40 mg Intravenous QHS  . phosphorus  500 mg Oral TID  . rivaroxaban  20 mg Oral Q supper   Continuous Infusions: . sodium chloride 250  mL (04/02/21 0611)     LOS: 1 day    Time spent: 35 mins.More than 50% of that time was spent in counseling and/or coordination of care.      Burnadette Pop, MD [facility] P5/04/2021, 7:58 AM

## 2021-04-03 NOTE — Progress Notes (Signed)
Report given to receiving nurse Bebe Liter and patient transferred from critical care to room 1410. Transported with nurse to accompany on cardiac monitor. NSR throughout. VSS throughout.

## 2021-04-04 DIAGNOSIS — I9589 Other hypotension: Secondary | ICD-10-CM

## 2021-04-04 LAB — CBC WITH DIFFERENTIAL/PLATELET
Abs Immature Granulocytes: 0.01 10*3/uL (ref 0.00–0.07)
Basophils Absolute: 0 10*3/uL (ref 0.0–0.1)
Basophils Relative: 0 %
Eosinophils Absolute: 0.1 10*3/uL (ref 0.0–0.5)
Eosinophils Relative: 1 %
HCT: 43.6 % (ref 39.0–52.0)
Hemoglobin: 13.6 g/dL (ref 13.0–17.0)
Immature Granulocytes: 0 %
Lymphocytes Relative: 22 %
Lymphs Abs: 1.2 10*3/uL (ref 0.7–4.0)
MCH: 25.2 pg — ABNORMAL LOW (ref 26.0–34.0)
MCHC: 31.2 g/dL (ref 30.0–36.0)
MCV: 80.7 fL (ref 80.0–100.0)
Monocytes Absolute: 0.4 10*3/uL (ref 0.1–1.0)
Monocytes Relative: 8 %
Neutro Abs: 3.8 10*3/uL (ref 1.7–7.7)
Neutrophils Relative %: 69 %
Platelets: 179 10*3/uL (ref 150–400)
RBC: 5.4 MIL/uL (ref 4.22–5.81)
RDW: 15.9 % — ABNORMAL HIGH (ref 11.5–15.5)
WBC: 5.6 10*3/uL (ref 4.0–10.5)
nRBC: 0 % (ref 0.0–0.2)

## 2021-04-04 LAB — BASIC METABOLIC PANEL
Anion gap: 12 (ref 5–15)
BUN: 39 mg/dL — ABNORMAL HIGH (ref 8–23)
CO2: 23 mmol/L (ref 22–32)
Calcium: 9.1 mg/dL (ref 8.9–10.3)
Chloride: 97 mmol/L — ABNORMAL LOW (ref 98–111)
Creatinine, Ser: 1.66 mg/dL — ABNORMAL HIGH (ref 0.61–1.24)
GFR, Estimated: 45 mL/min — ABNORMAL LOW (ref >60)
Glucose, Bld: 124 mg/dL — ABNORMAL HIGH (ref 70–99)
Potassium: 4.1 mmol/L (ref 3.5–5.1)
Sodium: 132 mmol/L — ABNORMAL LOW (ref 135–145)

## 2021-04-04 LAB — GLUCOSE, CAPILLARY
Glucose-Capillary: 106 mg/dL — ABNORMAL HIGH (ref 70–99)
Glucose-Capillary: 124 mg/dL — ABNORMAL HIGH (ref 70–99)
Glucose-Capillary: 127 mg/dL — ABNORMAL HIGH (ref 70–99)
Glucose-Capillary: 146 mg/dL — ABNORMAL HIGH (ref 70–99)
Glucose-Capillary: 187 mg/dL — ABNORMAL HIGH (ref 70–99)
Glucose-Capillary: 188 mg/dL — ABNORMAL HIGH (ref 70–99)
Glucose-Capillary: 202 mg/dL — ABNORMAL HIGH (ref 70–99)

## 2021-04-04 LAB — PHOSPHORUS: Phosphorus: 4.1 mg/dL (ref 2.5–4.6)

## 2021-04-04 MED ORDER — PANTOPRAZOLE SODIUM 40 MG PO TBEC
40.0000 mg | DELAYED_RELEASE_TABLET | Freq: Every day | ORAL | Status: DC
  Administered 2021-04-04 – 2021-04-06 (×3): 40 mg via ORAL
  Filled 2021-04-04 (×3): qty 1

## 2021-04-04 MED ORDER — FUROSEMIDE 10 MG/ML IJ SOLN
60.0000 mg | Freq: Two times a day (BID) | INTRAMUSCULAR | Status: AC
  Administered 2021-04-04: 60 mg via INTRAVENOUS
  Filled 2021-04-04: qty 6

## 2021-04-04 MED ORDER — CEFDINIR 300 MG PO CAPS
300.0000 mg | ORAL_CAPSULE | Freq: Two times a day (BID) | ORAL | Status: AC
  Administered 2021-04-04 – 2021-04-06 (×4): 300 mg via ORAL
  Filled 2021-04-04 (×4): qty 1

## 2021-04-04 NOTE — Plan of Care (Signed)
  Problem: Respiratory: Goal: Ability to maintain adequate ventilation will improve Outcome: Progressing   Problem: Education: Goal: Knowledge of General Education information will improve Description: Including pain rating scale, medication(s)/side effects and non-pharmacologic comfort measures Outcome: Progressing   Problem: Coping: Goal: Level of anxiety will decrease Outcome: Progressing   Problem: Elimination: Goal: Will not experience complications related to urinary retention Outcome: Progressing   Problem: Pain Managment: Goal: General experience of comfort will improve Outcome: Progressing   Problem: Safety: Goal: Ability to remain free from injury will improve Outcome: Progressing   Problem: Skin Integrity: Goal: Risk for impaired skin integrity will decrease Outcome: Progressing   

## 2021-04-04 NOTE — Evaluation (Addendum)
Physical Therapy Evaluation Patient Details Name: Christian [name] MRN: [id number] DOB: May 17, 1951 Today's Date: 04/04/2021   History of Present Illness  70 yo male admitted with sepsis. Hx of DM, CKD, CAD, PAF, OSA, DVT, bipolar, gout  Clinical Impression  On eval, pt required Min A for mobility. He walked ~60 feet with use of a RW. Pt presents with general weakness, decreased activity tolerance, and impaired gait and balance. Discussed d/c plan with pt-he acknowledges being weaker than his baseline. He reports he doesn't have 24/7 assistance available at home. He has been to rehab before and his not quite keen on going again. At this time, my recommendation is for ST rehab at SNF if pt is agreeable. If he declines placement, recommend HHPT and increased in home assistance. May benefit from a home health aide to assist with ADLs and hopefully family can provide increased assistance.    Follow Up Recommendations SNF;Supervision/Assistance - 24 hour (HHPT if pt can arrange for increased in-home assistance)    Equipment Recommendations  None recommended by PT    Recommendations for Other Services       Precautions / Restrictions Precautions Precautions: Fall Restrictions Weight Bearing Restrictions: No      Mobility  Bed Mobility               General bed mobility comments: oob in recliner    Transfers Overall transfer level: Needs assistance Equipment used: Rolling walker (2 wheeled) Transfers: Sit to/from Stand Sit to Stand: Min assist         General transfer comment: Small amount of A to rise, control descent. Cues for safety, technique, hand placement. Increased time.  Ambulation/Gait Ambulation/Gait assistance: Min assist Gait Distance (Feet): 60 Feet Assistive device: Rolling walker (2 wheeled) Gait Pattern/deviations: Step-through pattern;Decreased stride length     General Gait Details: Small amount of assistance to steady during ambulation. Observed  shakiness throughout distance. Dyspnea 2/4. Pt fatigues fairly easily  Stairs            Wheelchair Mobility    Modified Rankin (Stroke Patients Only)       Balance Overall balance assessment: Needs assistance         Standing balance support: Bilateral upper extremity supported Standing balance-Leahy Scale: Poor                               Pertinent Vitals/Pain Pain Assessment: Faces Faces Pain Scale: No hurt    Home Living Family/patient expects to be discharged to:: Private residence Living Arrangements: Alone Available Help at Discharge: Family;Available PRN/intermittently Type of Home: Apartment Home Access: Level entry     Home Layout: One level Home Equipment: Walker - 4 wheels;Bedside commode;Shower seat;Grab bars - toilet;Grab bars - tub/shower Additional Comments: Bed rail    Prior Function Level of Independence: Needs assistance   Gait / Transfers Assistance Needed: uses rollator for household ambulation  ADL's / Homemaking Assistance Needed: Independent with ADLs and light cooking. Daughter helps with errands and groceries. Sister brings over meals as well.        Hand Dominance   Dominant Hand: Right    Extremity/Trunk Assessment   Upper Extremity Assessment Upper Extremity Assessment: Defer to OT evaluation    Lower Extremity Assessment Lower Extremity Assessment: Generalized weakness    Cervical / Trunk Assessment Cervical / Trunk Assessment: Normal  Communication   Communication: No difficulties  Cognition Arousal/Alertness: Awake/alert Behavior During Therapy: Flat  affect Overall Cognitive Status: Within Functional Limits for tasks assessed                                        General Comments      Exercises General Exercises - Lower Extremity Long Arc Quad: AROM;Both;10 reps Hip Flexion/Marching: AROM;Both;10 reps   Assessment/Plan    PT Assessment Patient needs continued PT  services  PT Problem List Decreased strength;Decreased mobility;Decreased activity tolerance;Decreased balance       PT Treatment Interventions DME instruction;Gait training;Therapeutic exercise;Therapeutic activities;Patient/family education;Functional mobility training;Balance training    PT Goals (Current goals can be found in the Care Plan section)  Acute Rehab PT Goals Patient Stated Goal: none stated PT Goal Formulation: With patient Time For Goal Achievement: 04/18/21 Potential to Achieve Goals: Good    Frequency Min 3X/week   Barriers to discharge        Co-evaluation               AM-PAC PT "6 Clicks" Mobility  Outcome Measure Help needed turning from your back to your side while in a flat bed without using bedrails?: A Little Help needed moving from lying on your back to sitting on the side of a flat bed without using bedrails?: A Little Help needed moving to and from a bed to a chair (including a wheelchair)?: A Little Help needed standing up from a chair using your arms (e.g., wheelchair or bedside chair)?: A Little Help needed to walk in hospital room?: A Little Help needed climbing 3-5 steps with a railing? : A Lot 6 Click Score: 17    End of Session Equipment Utilized During Treatment: Gait belt Activity Tolerance: Patient limited by fatigue Patient left: in chair;with call bell/phone within reach   PT Visit Diagnosis: Unsteadiness on feet (R26.81);Muscle weakness (generalized) (M62.81);Difficulty in walking, not elsewhere classified (R26.2)    Time: 1010-1023 PT Time Calculation (min) (ACUTE ONLY): 13 min   Charges:   PT Evaluation $PT Eval Moderate Complexity: 1 Mod             Faye Ramsay, PT Acute Rehabilitation  Office: [phone] Pager: [phone]

## 2021-04-04 NOTE — Progress Notes (Signed)
PROGRESS NOTE    Christian [name]  MBW:[id number] DOB: November 16, 1951 DOA: 04/01/2021 PCP: [name], MD   Chief Complain: Altered mental status  Brief Narrative: Patient is a 70 year old African-American male with history of CHF, chronic renal disease stage 3b, gout, hypertension, OSA on CPAP, hyperlipidemia, type 2 diabetes mellitus, bipolar 1 disorder, DVT who presented to the emergency department with complaints of altered mental status.  Also reported dysuria and bilateral lower EXTR swelling on  presentation.  History of noncompliance and not taking medication.  On presentation, initially was hypotensive, febrile.  Septic shock was suspected, had to be started on vasopressors and he was admitted to ICU.  Started on IV antibiotics. patient transferred to [facility] service on 04/03/21.  Found to be volume overloaded, started on diuresis.  PT/OT recommended skilled nursing facilty on discharge.  TOC consulted.  Assessment & Plan:   Active Problems:   Type 2 diabetes mellitus with complication, without long-term current use of insulin (HCC)   Bipolar affective disorder, depressed, mild (HCC)   Sepsis (HCC)   Lactic acidosis   Hyponatremia   Hyperkalemia   Chronic kidney disease, stage 3b (HCC)   Fever   Septic shock (HCC)   Severe sepsis/suspected septic shock: Presented with hypotension.  Started on pressors on admission.  Source was thought to be UTI initially and was started on ceftriaxone.  Source is unclear follow-up cultures, currently afebrile and hemodynamically stable.  Urine culture was never sent.  Lactic acidosis  Has  resolved.  Leukocytosis resolved. Chest x-ray done on 04/02/21 showed possible left lower infiltrate but patient does not have any signs of pneumonia.  Abx discontinued.Sepsis ruled out  AKI on CKD stage IIIb: Currently kidney function close at baseline. Foley placed on presentation now removed.   Patient looked severely volume overloaded.  Started on Lasix  Acute on  chronic diastolic congestive heart failure:Echo on 09/2020 showed ejection fraction of 65 to 70%, grade 1 diastolic dysfunction.  Patient looks volume overloaded.  BNP.  Echo shows ejection fraction of 55 to 60%, grade 1 diastolic dysfunction, no wall motion abnormality .continue Lasix.  Monitor input/output, daily weight  Altered mental status: Most likely secondary to metabolic encephalopathy from sepsis.  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  Monitor blood sugars.  Hb A1c of 7.7  Obesity: BMI of 34  Generalized weakness/debility/deconditioning: PT/OT consulted and recommended skilled nursing facility on discharge         DVT prophylaxis: Xarelto Code Status: Full code Family Communication: None present at the bedside Status is: Inpatient  Remains inpatient appropriate because:Inpatient level of care appropriate due to severity of illness   Dispo: The patient is from: Home              Anticipated d/c is to: Home              Patient currently is not medically stable to d/c.   Difficult to place patient No     Consultants: None  Procedures:None  Antimicrobials:  Anti-infectives (From admission, onward)   Start     Dose/Rate Route Frequency Ordered Stop   04/03/21 2200  cefTRIAXone (ROCEPHIN) 2 g in sodium chloride 0.9 % 100 mL IVPB  Status:  Discontinued        2 g 200 mL/hr over 30 Minutes Intravenous Every 24 hours 04/03/21 0809 04/04/21 1110   04/02/21 2200  cefTRIAXone (ROCEPHIN) 2 g in sodium chloride 0.9 % 100 mL IVPB        2 g 200 mL/hr over 30 Minutes Intravenous  Once 04/02/21 1645 04/02/21 2206   04/01/21 2300  vancomycin (VANCOREADY) IVPB 2000 mg/400 mL        2,000 mg 200 mL/hr over 120 Minutes Intravenous  Once 04/01/21 2249 04/02/21 0240   04/01/21 2230  cefTRIAXone (ROCEPHIN) 2 g in sodium chloride  0.9 % 100 mL IVPB        2 g 200 mL/hr over 30 Minutes Intravenous  Once 04/01/21 2227 04/02/21 0023      Subjective: Patient seen and examined at bedside this morning.  Hemodynamically stable.  Comfortable.  Volume overload looks much improved today.  Sitting on the chair.  Denies any new complaints  Objective: Vitals:   04/03/21 0451 04/03/21 1424 04/03/21 2052 04/04/21 0418  BP: 135/76 118/83 (!) 153/93   Pulse: 77 76 73   Resp: Temp: 98.9 F (37.2 C) 97.9 F (36.6 C) 98.4 F (36.9 C)   TempSrc: Oral Oral Oral   SpO2: 95%  97%   Weight:    113.8 kg  Height:        Intake/Output Summary (Last 24 hours) at 04/04/2021 1113 Last data filed at 04/04/2021 0600 Gross per 24 hour  Intake 795.24 ml  Output 4800 ml  Net -4004.76 ml   Filed Weights   04/02/21 0800 04/03/21 0446 04/04/21 0418  Weight: 117.8 kg 117.2 kg 113.8 kg    Examination:  General exam: Overall comfortable, not in distress, morbidly obese HEENT: PERRL Respiratory system:  no wheezes or crackles  Cardiovascular system: S1 & S2 heard, RRR.  Gastrointestinal system: Abdomen is nondistended, soft and nontender. Central nervous system: Alert and oriented Extremities: Trace bilateral lower extremity  edema, no clubbing ,no cyanosis Skin: No rashes, no ulcers,no icterus    Data Reviewed: I have personally reviewed following labs and imaging studies  CBC: Recent Labs  Lab 04/01/21 1947 04/02/21 0455 04/03/21 0246 04/04/21 0457  WBC 16.6* 14.7* 9.6 5.6  NEUTROABS 14.9*  --  8.2* 3.8  HGB 14.7 12.7* 13.1 13.6  HCT 49.5 41.4 43.3 43.6  MCV 84.5 83.1 83.8 80.7  PLT 209 181 178 179   Basic Metabolic Panel: Recent Labs  Lab 04/01/21 1947 04/02/21 0455 04/03/21 0246 04/04/21 0457  NA 131* 138 139 132*  K 5.3* 4.7 4.5 4.1  CL 100 107 109 97*  CO2 20* 22 20* 23  GLUCOSE 206* 109* 108* 124*  BUN 38* 35* 37* 39*  CREATININE 2.13* 1.88* 1.71* 1.66*  CALCIUM 9.1 8.7* 9.4 9.1  MG  --  2.0  2.0  --   PHOS  --   --  2.4* 4.1   GFR: Estimated Creatinine Clearance: 56.3 mL/min (A) (by C-G formula based on SCr of 1.66 mg/dL (H)). Liver Function Tests: Recent Labs  Lab 04/01/21 1947 04/03/21 0246  AST 36 23  ALT 38 29  ALKPHOS 67 55  BILITOT 0.7 0.1*  PROT 7.5 6.7  ALBUMIN 3.5 3.1*   No results for input(s): LIPASE, AMYLASE in the last 168 hours. No results for input(s): AMMONIA in the last 168 hours. Coagulation Profile: Recent Labs  Lab 04/01/21 2029  INR 1.6*   Cardiac Enzymes: No results for input(s): CKTOTAL, CKMB, CKMBINDEX, TROPONINI in the last 168 hours. BNP (last 3 results) No results for input(s): PROBNP in the last 8760 hours. HbA1C:  Recent Labs    04/03/21 0246  HGBA1C 7.7*   CBG: Recent Labs  Lab 04/03/21 1628 04/03/21 1944 04/04/21 0018 04/04/21 0412 04/04/21 0742  GLUCAP 110* 110* 106* 124* 127*   Lipid Profile: No results for input(s): CHOL, HDL, LDLCALC, TRIG, CHOLHDL, LDLDIRECT in the last 72 hours. Thyroid Function Tests: Recent Labs    04/02/21 0055  TSH 0.519   Anemia Panel: No results for input(s): VITAMINB12, FOLATE, FERRITIN, TIBC, IRON, RETICCTPCT in the last 72 hours. Sepsis Labs: Recent Labs  Lab 04/01/21 1947 04/01/21 2208 04/02/21 0455 04/02/21 0808  LATICACIDVEN 2.1* 2.6* 1.1 0.9    Recent Results (from the past 240 hour(s))  Culture, blood (Routine x 2)     Status: None (Preliminary result)   Collection Time: 04/01/21  7:47 PM   Specimen: BLOOD RIGHT FOREARM  Result Value Ref Range Status   Specimen Description   Final    BLOOD RIGHT FOREARM Performed at [facility], [address], [place], [place] [address]    Special Requests   Final    BOTTLES DRAWN AEROBIC AND ANAEROBIC Blood Culture results may not be optimal due to an inadequate volume of blood received in culture bottles Performed at [facility], [address], [place], [place] [address]    Culture    Final    NO GROWTH 1 DAY Performed at [facility], [address], [place], [place] [address]    Report Status PENDING  Incomplete  Culture, blood (Routine x 2)     Status: None (Preliminary result)   Collection Time: 04/01/21  8:29 PM   Specimen: BLOOD LEFT WRIST  Result Value Ref Range Status   Specimen Description   Final    BLOOD LEFT WRIST Performed at [facility], [address] [place], [place] [address]    Special Requests   Final    BOTTLES DRAWN AEROBIC AND ANAEROBIC Blood Culture results may not be optimal due to an inadequate volume of blood received in culture bottles Performed at [facility], [address] [place], [place] [address]    Culture   Final    NO GROWTH 1 DAY Performed at [facility], [address], [place], [place] [address]    Report Status PENDING  Incomplete  Resp Panel by RT-PCR (Flu A&B, Covid) Nasopharyngeal Swab     Status: None   Collection Time: 04/01/21  8:29 PM   Specimen: Nasopharyngeal Swab; Nasopharyngeal(NP) swabs in vial transport medium  Result Value Ref Range Status   SARS Coronavirus 2 by RT PCR NEGATIVE NEGATIVE Final    Comment: (NOTE) SARS-CoV-2 target nucleic acids are NOT DETECTED.  The SARS-CoV-2 RNA is generally detectable in upper respiratory specimens during the acute phase of infection. The lowest concentration of SARS-CoV-2 viral copies this assay can detect is 138 copies/mL. A negative result does not preclude SARS-Cov-2 infection and should not be used as the sole basis for treatment or other patient management decisions. A negative result may occur with  improper specimen collection/handling, submission of specimen other than nasopharyngeal swab, presence of viral mutation(s) within the areas targeted by this assay, and inadequate number of viral copies(<138 copies/mL). A negative result must be combined with clinical observations, patient history, and  epidemiological information. The expected result is Negative.  Fact Sheet for Patients:  BloggerCourse.com  Fact Sheet for Healthcare Providers:  SeriousBroker.it  This test is no t yet approved or cleared by  the [employer] and  has been authorized for detection and/or diagnosis of SARS-CoV-2 by FDA under an Emergency Use Authorization (EUA). This EUA will remain  in effect (meaning this test can be used) for the duration of the COVID-19 declaration under Section 564(b)(1) of the Act, 21 U.S.C.section 360bbb-3(b)(1), unless the authorization is terminated  or revoked sooner.       Influenza A by PCR NEGATIVE NEGATIVE Final   Influenza B by PCR NEGATIVE NEGATIVE Final    Comment: (NOTE) The Xpert Xpress SARS-CoV-2/FLU/RSV plus assay is intended as an aid in the diagnosis of influenza from Nasopharyngeal swab specimens and should not be used as a sole basis for treatment. Nasal washings and aspirates are unacceptable for Xpert Xpress SARS-CoV-2/FLU/RSV testing.  Fact Sheet for Patients: BloggerCourse.com  Fact Sheet for Healthcare Providers: SeriousBroker.it  This test is not yet approved or cleared by the [place] FDA and has been authorized for detection and/or diagnosis of SARS-CoV-2 by FDA under an Emergency Use Authorization (EUA). This EUA will remain in effect (meaning this test can be used) for the duration of the COVID-19 declaration under Section 564(b)(1) of the Act, 21 U.S.C. section 360bbb-3(b)(1), unless the authorization is terminated or revoked.  Performed at [facility], [address], [place], [place] [address]   MRSA PCR Screening     Status: None   Collection Time: 04/02/21  7:48 AM   Specimen: Nasal Mucosa; Nasopharyngeal  Result Value Ref Range Status   MRSA by PCR NEGATIVE NEGATIVE Final    Comment:        The  GeneXpert MRSA Assay (FDA approved for NASAL specimens only), is one component of a comprehensive MRSA colonization surveillance program. It is not intended to diagnose MRSA infection nor to guide or monitor treatment for MRSA infections. Performed at [facility], [address] [place], [place] [address]          Radiology Studies: ECHOCARDIOGRAM COMPLETE  Result Date: 04/03/2021    ECHOCARDIOGRAM REPORT   Patient Name:   MASAICHI KRACHT Date of Exam: 04/03/2021 Medical Rec #:  [id number]        Height:       72.0 in Accession #:    [id number]       Weight:       258.4 lb Date of Birth:  28-Aug-1951        BSA:          2.376 m Patient Age:    36 years         BP:           118/83 mmHg Patient Gender: M                HR:           77 bpm. Exam Location:  Inpatient Procedure: 2D Echo, Cardiac Doppler, Color Doppler and Intracardiac            Opacification Agent Indications:    I50.31 Acute diastolic (congestive) heart failure  History:        Patient has prior history of Echocardiogram examinations, most                 recent 10/08/2020.  Sonographer:    Roosvelt Maser RDCS Referring Phys: [id number] Mathias Bogacki  Sonographer Comments: Technically difficult study due to poor echo windows. IMPRESSIONS  1. Left ventricular ejection fraction, by estimation, is 55 to 60%. Left ventricular ejection fraction by 2D  MOD biplane is 56.5 %. The left ventricle has normal function. The left ventricle has no regional wall motion abnormalities. Left ventricular diastolic parameters are consistent with Grade I diastolic dysfunction (impaired relaxation).  2. Right ventricular systolic function is normal. The right ventricular size is normal. Tricuspid regurgitation signal is inadequate for assessing PA pressure.  3. Left atrial size was mildly dilated.  4. The mitral valve is degenerative. No evidence of mitral valve regurgitation. No evidence of mitral stenosis.  5. The aortic valve is  tricuspid. There is moderate calcification of the aortic valve. There is moderate thickening of the aortic valve. Aortic valve regurgitation is not visualized. Mild aortic valve sclerosis is present, with no evidence of aortic valve stenosis. Comparison(s): No significant change from prior study. FINDINGS  Left Ventricle: Left ventricular ejection fraction, by estimation, is 55 to 60%. Left ventricular ejection fraction by 2D MOD biplane is 56.5 %. The left ventricle has normal function. The left ventricle has no regional wall motion abnormalities. Definity contrast agent was given IV to delineate the left ventricular endocardial borders. The left ventricular internal cavity size was normal in size. There is no left ventricular hypertrophy. Left ventricular diastolic parameters are consistent with Grade I diastolic dysfunction (impaired relaxation). Right Ventricle: The right ventricular size is normal. No increase in right ventricular wall thickness. Right ventricular systolic function is normal. Tricuspid regurgitation signal is inadequate for assessing PA pressure. Left Atrium: Left atrial size was mildly dilated. Right Atrium: Right atrial size was normal in size. Pericardium: Trivial pericardial effusion is present. Presence of pericardial fat pad. Mitral Valve: The mitral valve is degenerative in appearance. Mild mitral annular calcification. No evidence of mitral valve regurgitation. No evidence of mitral valve stenosis. Tricuspid Valve: The tricuspid valve is grossly normal. Tricuspid valve regurgitation is not demonstrated. No evidence of tricuspid stenosis. Aortic Valve: The aortic valve is tricuspid. There is moderate calcification of the aortic valve. There is moderate thickening of the aortic valve. Aortic valve regurgitation is not visualized. Mild aortic valve sclerosis is present, with no evidence of aortic valve stenosis. Aortic valve mean gradient measures 5.0 mmHg. Aortic valve peak gradient  measures 8.3 mmHg. Aortic valve area, by VTI measures 2.33 cm. Pulmonic Valve: The pulmonic valve was not well visualized. Pulmonic valve regurgitation is not visualized. Aorta: The aortic root is normal in size and structure. Venous: The inferior vena cava was not well visualized. IAS/Shunts: The atrial septum is grossly normal.  LEFT VENTRICLE PLAX 2D                        Biplane EF (MOD) LVOT diam:     2.30 cm         LV Biplane EF:   Left LV SV:         58                               ventricular LV SV Index:   24                               ejection LVOT Area:     4.15 cm                         fraction by  2D MOD                                                 biplane is LV Volumes (MOD)                                56.5 %. LV vol d, MOD    59.2 ml A2C:                           Diastology LV vol d, MOD    53.4 ml       LV e' medial:    4.79 cm/s A4C:                           LV E/e' medial:  13.5 LV vol s, MOD    26.4 ml       LV e' lateral:   7.62 cm/s A2C:                           LV E/e' lateral: 8.5 LV vol s, MOD    22.8 ml A4C: LV SV MOD A2C:   32.8 ml LV SV MOD A4C:   53.4 ml LV SV MOD BP:    32.0 ml RIGHT VENTRICLE RV Basal diam:  3.46 cm RV S prime:     10.70 cm/s TAPSE (M-mode): 1.4 cm LEFT ATRIUM             Index       RIGHT ATRIUM           Index LA Vol (A2C):   99.5 ml 41.88 ml/m RA Area:     20.10 cm LA Vol (A4C):   94.4 ml 39.74 ml/m RA Volume:   56.10 ml  23.62 ml/m LA Biplane Vol: 99.7 ml 41.97 ml/m  AORTIC VALVE AV Area (Vmax):    2.13 cm AV Area (Vmean):   1.93 cm AV Area (VTI):     2.33 cm AV Vmax:           144.00 cm/s AV Vmean:          105.000 cm/s AV VTI:            0.248 m AV Peak Grad:      8.3 mmHg AV Mean Grad:      5.0 mmHg LVOT Vmax:         73.90 cm/s LVOT Vmean:        48.800 cm/s LVOT VTI:          0.139 m LVOT/AV VTI ratio: 0.56 MITRAL VALVE MV Area (PHT): 2.91 cm    SHUNTS MV Decel Time: 261 msec    Systemic  VTI:  0.14 m MV E velocity: 64.50 cm/s  Systemic Diam: 2.30 cm MV A velocity: 89.70 cm/s MV E/A ratio:  0.72 Lennie Odor MD Electronically signed by Lennie Odor MD Signature Date/Time: 04/03/2021/3:48:05 PM    Final         Scheduled Meds: . Chlorhexidine Gluconate Cloth  6 each Topical Daily  . furosemide  60 mg Intravenous Q12H  . insulin aspart  0-9 Units Subcutaneous Q4H  . pantoprazole  40 mg Oral Daily  .  rivaroxaban  20 mg Oral Q supper   Continuous Infusions: . sodium chloride 250 mL (04/03/21 2322)     LOS: 2 days    Time spent: 35 mins.More than 50% of that time was spent in counseling and/or coordination of care.      Burnadette Pop, MD [facility] P5/05/2021, 11:13 AM

## 2021-04-04 NOTE — Evaluation (Signed)
Occupational Therapy Evaluation Patient Details Name: Christian [name] MRN: [id number] DOB: March 04, 1951 Today's Date: 04/04/2021    History of Present Illness 70 yo male admitted with sepsis. Hx of DM, CKD, CAD, PAF, OSA, DVT, bipolar, gout   Clinical Impression   [name] is a 70 year old man who presents with generalized weakness, decreased activity tolerance, increased hand tremors, and impaired balance resulting in a decline in ability to perform independent and safe mobility and ADLs. Patient lives alone at baseline and able to perform ADLs and functional mobility. Patient has a daughter that "basically" checks on him daily. Patient overall requiring min assist to stand, min guard to ambulate with shuffling gait and walker, and predominantly min assist for ADLs (more assistance for LB dressing as he uses AE at baseline). Patient's affect is flat without spontaneous conversation. He only answers questions with short statements - though he is polite. Patient will benefit from skilled OT services while in hospital to improve deficits and learn compensatory strategies as needed in order to return to PLOF.  Recommend short term rehab at discharge as he reports he is not ambulating at his baseline and increased hand tremors. If patient refuses SNF - recommend [facility] services.    Follow Up Recommendations  SNF    Equipment Recommendations  None recommended by OT    Recommendations for Other Services       Precautions / Restrictions Precautions Precautions: Fall Restrictions Weight Bearing Restrictions: No      Mobility Bed Mobility Overal bed mobility: Needs Assistance Bed Mobility: Supine to Sit     Supine to sit: Supervision     General bed mobility comments: increased time to transfer to side of bed.    Transfers Overall transfer level: Needs assistance Equipment used: Rolling walker (2 wheeled) Transfers: Sit to/from Stand Sit to Stand: Min assist          General transfer comment: Min assist to stand from elevated bed height. Min guard - slow shuffling gait with RW - to bathroom and back to recliner.    Balance Overall balance assessment: Needs assistance Sitting-balance support: No upper extremity supported;Feet supported Sitting balance-Leahy Scale: Good     Standing balance support: Bilateral upper extremity supported Standing balance-Leahy Scale: Poor                             ADL either performed or assessed with clinical judgement   ADL Overall ADL's : Needs assistance/impaired Eating/Feeding: Independent   Grooming: Min guard;Wash/dry face;Wash/dry hands;Standing Grooming Details (indicate cue type and reason): stood at sink to perform with min guard, hand tremors making task a little bit more difficult. Initially able to stand without hand hold assistance then transitioned to one hand propped on sink. Upper Body Bathing: Set up;Sitting   Lower Body Bathing: Minimal assistance;Sit to/from stand   Upper Body Dressing : Sitting;Minimal assistance   Lower Body Dressing: Moderate assistance;Sit to/from stand Lower Body Dressing Details (indicate cue type and reason): Today mod assist for LB dressing - typically uses sock aide at home Toilet Transfer: Minimal assistance;Regular Toilet;Grab bars;RW   Toileting- Clothing Manipulation and Hygiene: Minimal assistance;Sit to/from stand               Vision Patient Visual Report: No change from baseline Vision Assessment?: No apparent visual deficits     Perception     Praxis      Pertinent Vitals/Pain Pain Assessment: No/denies  pain Faces Pain Scale: No hurt     Hand Dominance Right   Extremity/Trunk Assessment Upper Extremity Assessment Upper Extremity Assessment: RUE deficits/detail;LUE deficits/detail RUE Deficits / Details: Impaired shoulder ROM from prior rotator cuff surgery but otherwise functional strength. RUE Sensation: WNL RUE  Coordination: decreased fine motor (decreased fine motor due to hand tremors.) LUE Deficits / Details: WFL ROM, 3+/5 shoulder strength otherwise functional strength. Hand tremors noted. LUE Sensation: WNL LUE Coordination: decreased fine motor (hand tremors)   Lower Extremity Assessment Lower Extremity Assessment: Defer to PT evaluation   Cervical / Trunk Assessment Cervical / Trunk Assessment: Normal   Communication Communication Communication: No difficulties   Cognition Arousal/Alertness: Awake/alert Behavior During Therapy: Flat affect Overall Cognitive Status: Within Functional Limits for tasks assessed                                     General Comments       Exercises General Exercises - Lower Extremity Long Arc Quad: AROM;Both;10 reps Hip Flexion/Marching: AROM;Both;10 reps   Shoulder Instructions      Home Living Family/patient expects to be discharged to:: Private residence Living Arrangements: Alone Available Help at Discharge: Family;Available PRN/intermittently Type of Home: Apartment Home Access: Level entry     Home Layout: One level     Bathroom Shower/Tub: Producer, television/film/video: Standard Bathroom Accessibility: Yes   Home Equipment: Environmental consultant - 4 wheels;Bedside commode;Shower seat;Grab bars - toilet;Grab bars - tub/shower   Additional Comments: Bed rail      Prior Functioning/Environment Level of Independence: Needs assistance  Gait / Transfers Assistance Needed: uses rollator for household ambulation ADL's / Homemaking Assistance Needed: Independent with ADLs and light cooking. Daughter helps with errands and groceries. Sister brings over meals as well.            OT Problem List: Decreased strength;Decreased activity tolerance;Impaired balance (sitting and/or standing);Obesity;Increased edema      OT Treatment/Interventions: Self-care/ADL training;Therapeutic exercise;DME and/or AE instruction;Therapeutic  activities;Balance training;Patient/family education    OT Goals(Current goals can be found in the care plan section) Acute Rehab OT Goals Patient Stated Goal: none stated Time For Goal Achievement: 04/18/21 Potential to Achieve Goals: Good  OT Frequency: Min 2X/week   Barriers to D/C: Decreased caregiver support          Co-evaluation              AM-PAC OT "6 Clicks" Daily Activity     Outcome Measure Help from another person eating meals?: None Help from another person taking care of personal grooming?: A Little Help from another person toileting, which includes using toliet, bedpan, or urinal?: A Little Help from another person bathing (including washing, rinsing, drying)?: A Little Help from another person to put on and taking off regular upper body clothing?: A Little Help from another person to put on and taking off regular lower body clothing?: A Lot 6 Click Score: 18   End of Session Equipment Utilized During Treatment: Gait belt;Rolling walker Nurse Communication: Mobility status  Activity Tolerance: Patient tolerated treatment well Patient left: in chair;with call bell/phone within reach  OT Visit Diagnosis: Unsteadiness on feet (R26.81);Muscle weakness (generalized) (M62.81);Other abnormalities of gait and mobility (R26.89)                Time: 8315-1761 OT Time Calculation (min): 25 min Charges:  OT General Charges $OT Visit: 1 Visit OT  Evaluation $OT Eval Low Complexity: 1 Low OT Treatments $Self Care/Home Management : 8-22 mins  Tomasa Dobransky, OTR/L Acute Care Rehab Services  Office [phone] Pager: [phone]   Kelli Churn 04/04/2021, 11:46 AM

## 2021-04-04 NOTE — Plan of Care (Signed)

## 2021-04-05 LAB — BASIC METABOLIC PANEL
Anion gap: 10 (ref 5–15)
BUN: 40 mg/dL — ABNORMAL HIGH (ref 8–23)
CO2: 25 mmol/L (ref 22–32)
Calcium: 9.2 mg/dL (ref 8.9–10.3)
Chloride: 100 mmol/L (ref 98–111)
Creatinine, Ser: 1.73 mg/dL — ABNORMAL HIGH (ref 0.61–1.24)
GFR, Estimated: 43 mL/min — ABNORMAL LOW (ref >60)
Glucose, Bld: 121 mg/dL — ABNORMAL HIGH (ref 70–99)
Potassium: 3.9 mmol/L (ref 3.5–5.1)
Sodium: 135 mmol/L (ref 135–145)

## 2021-04-05 LAB — GLUCOSE, CAPILLARY
Glucose-Capillary: 130 mg/dL — ABNORMAL HIGH (ref 70–99)
Glucose-Capillary: 134 mg/dL — ABNORMAL HIGH (ref 70–99)
Glucose-Capillary: 146 mg/dL — ABNORMAL HIGH (ref 70–99)
Glucose-Capillary: 155 mg/dL — ABNORMAL HIGH (ref 70–99)
Glucose-Capillary: 160 mg/dL — ABNORMAL HIGH (ref 70–99)
Glucose-Capillary: 180 mg/dL — ABNORMAL HIGH (ref 70–99)

## 2021-04-05 MED ORDER — FUROSEMIDE 40 MG PO TABS
40.0000 mg | ORAL_TABLET | Freq: Every day | ORAL | Status: DC
  Administered 2021-04-05 – 2021-04-06 (×2): 40 mg via ORAL
  Filled 2021-04-05 (×2): qty 1

## 2021-04-05 NOTE — Progress Notes (Signed)
Transition of Care (TOC) -30 day Note       Patient Details  Name:  MRN: Date of Birth:   Transition of Care [facility]) CM/SW Contact  Name:  Amada Jupiter, [place] Phone Number:  [phone] Date:  04/05/21 Time:  1441   MUST ID: [id number]   To Whom it May Concern:   Please be advised that the above patient will require a short-term nursing home stay, anticipated 30 days or less rehabilitation and strengthening. The plan is for return home.

## 2021-04-05 NOTE — TOC Initial Note (Signed)
Transition of Care [facility]) - Initial/Assessment Note    Patient Details  Name: Christian [name] MRN: [id number] Date of Birth: 02-15-1951  [facility]) CM/SW Contact:    Lennart Pall, LCSW Phone Number: 04/05/2021, 4:50 PM  Clinical Narrative:                 Met with pt today to introduce TOC role and discuss dc plans. Pt is aware that therapies have recommended short term SNF prior to return to his apt where he lives alone. He is agreeable with this plan.  His affect is very flat but he is fully oriented.  Noted psychiatric hx, therefore, have submitted PASRR and anticipate will need Level 2 approval.  Pt does prefer SNF in [place] area if at all possible.  Expected Discharge Plan: Skilled Nursing Facility Barriers to Discharge: [place] (PASRR),Insurance Authorization   Patient Goals and CMS Choice Patient states their goals for this hospitalization and ongoing recovery are:: short term SNF then return home      Expected Discharge Plan and Services Expected Discharge Plan: [place] In-house Referral: Clinical Social Work     Living arrangements for the past 2 months: Apartment                                      Prior Living Arrangements/Services Living arrangements for the past 2 months: Apartment Lives with:: Self Patient language and need for interpreter reviewed:: Yes Do you feel safe going back to the place where you live?: Yes      Need for Family Participation in Patient Care: Yes (Comment) Care giver support system in place?: No (comment)      Activities of Daily Living Home Assistive Devices/Equipment: CBG Meter,CPAP,Eyeglasses,Bedside commode/3-in-1,Shower chair without back,Other (Comment),Walker (specify type) (4 wheeled walker, tub/shower unit) ADL Screening (condition at time of admission) Patient's cognitive ability adequate to safely complete daily activities?: No Is the patient deaf or have difficulty  hearing?: No Does the patient have difficulty seeing, even when wearing glasses/contacts?: No Does the patient have difficulty concentrating, remembering, or making decisions?: Yes Patient able to express need for assistance with ADLs?: Yes Does the patient have difficulty dressing or bathing?: Yes Independently performs ADLs?: No Communication: Independent (has expressive difficulties) Dressing (OT): Needs assistance (daughter helps with dressing) Is this a change from baseline?: Pre-admission baseline Grooming: Independent Feeding: Independent Bathing: Needs assistance (daughter helps with bathing) Is this a change from baseline?: Pre-admission baseline Toileting: Needs assistance Is this a change from baseline?: Change from baseline, expected to last >3days In/Out Bed: Needs assistance Is this a change from baseline?: Change from baseline, expected to last >3 days Walks in Home: Needs assistance Is this a change from baseline?: Change from baseline, expected to last >3 days Does the patient have difficulty walking or climbing stairs?: Yes (secondary to weakness) Weakness of Legs: Both Weakness of Arms/Hands: None  Permission Sought/Granted Permission sought to share information with : Family Supports Permission granted to share information with : Yes, Verbal Permission Granted  Share Information with NAME: [name]     Permission granted to share info w Relationship: legal guardian  Permission granted to share info w Contact Information: [phone]  Emotional Assessment Appearance:: Appears stated age Attitude/Demeanor/Rapport: Guarded Affect (typically observed): Blunt,Accepting Orientation: : Oriented to Self,Oriented to Place,Oriented to  Time,Oriented to Situation Alcohol / Substance Use: Not Applicable Psych Involvement:  No (comment)  Admission diagnosis:  Septic shock ([place]) [A41.9, R65.21] Sepsis ([place]) [A41.9] Patient Active Problem List   Diagnosis Date  Noted  . Sepsis ([place]) 04/02/2021  . Lactic acidosis 04/02/2021  . Hyponatremia 04/02/2021  . Hyperkalemia 04/02/2021  . Chronic kidney disease, stage 3b ([place]) 04/02/2021  . Fever 04/02/2021  . Septic shock ([place]) 04/02/2021  . Altered mental status   . AKI (acute kidney injury) ([place])   . CVA (cerebral vascular accident) ([place]) 10/07/2020  . Type 2 diabetes mellitus with stage 3 chronic kidney disease ([place]) 04/01/2020  . CAD (coronary artery disease), native coronary artery 04/01/2020  . NSTEMI (non-ST elevated myocardial infarction) ([place]) 03/31/2020  . Obesity, Class III, BMI 40-49.9 (morbid obesity) ([place]) 03/31/2020  . Aspiration pneumonia of both lower lobes due to gastric secretions ([place]) 03/21/2020  . Sacral decubitus ulcer, stage II ([place]) 03/19/2020  . Chronic diastolic CHF (congestive heart failure) ([place]) 03/17/2020  . Atrial flutter, paroxysmal ([place]) 12/27/2019  . Constipation 09/16/2018  . Bipolar 1 disorder, depressed ([place]) 08/22/2018  . Encephalopathy   . Paroxysmal atrial fibrillation ([place]) 06/18/2018  . History of pulmonary embolism 06/17/2018  . Generalized weakness 03/27/2018  . Diabetes mellitus type 2 in nonobese ([place]) 03/27/2018  . Pulmonary embolism ([place]) 03/27/2018  . Fall   . Laceration of eyebrow   . Bipolar I disorder, most recent episode depressed, severe w psychosis ([place]) 12/22/2015  . Bipolar affective disorder, depressed, severe, with psychotic behavior ([place])   . Bipolar I disorder, current or most recent episode depressed, with psychotic features ([place])   . Severe bipolar I disorder with depression ([place])   . Bipolar I disorder, most recent episode depressed, severe with psychotic features ([place])   . Overdose 09/11/2015  . Acute respiratory failure with hypoxia ([place]) 09/05/2015  . Elevated troponin 09/05/2015  . Somnolence 09/05/2015  . Bipolar depression ([place]) 09/05/2015  . Acute bilateral deep vein thrombosis (DVT) of femoral veins ([place]) 09/05/2015  .  Bilateral pulmonary embolism ([place]) 09/02/2015  . Labile hypertension   . Pulmonary embolism with acute cor pulmonale ([place])   . Dyspnea   . Orthostatic hypotension   . Bipolar disorder, in partial remission, most recent episode depressed ([place])   . Syncope, near 08/31/2015  . Affective psychosis, bipolar ([place])   . Bipolar affective disorder, depressed, mild ([place]) 08/28/2015  . Pressure ulcer 07/30/2015  . Protein-calorie malnutrition, severe ([place]) 07/30/2015  . Dehydration 07/29/2015  . Diabetes ([place]) 07/09/2015  . UTI (urinary tract infection) 06/19/2014  . Positive blood culture 06/19/2014  . Type 2 diabetes mellitus with complication, without long-term current use of insulin ([place]) 06/02/2014  . Other and unspecified hyperlipidemia 06/02/2014  . OSA on CPAP 06/02/2014  . Bilateral lower extremity edema: chronic with venous stasis changes 06/02/2014  . Gout 06/02/2014  . Hypertension    PCP:  Donald Prose, MD Pharmacy:   [facility], [address] [place] [address] Phone: [phone] Fax: [phone]     Social Determinants of Health (SDOH) Interventions    Readmission Risk Interventions No flowsheet data found.

## 2021-04-05 NOTE — NC FL2 (Signed)
[place] MEDICAID FL2 LEVEL OF CARE SCREENING TOOL     IDENTIFICATION  Patient Name: Christian [name] Birthdate: November 17, 1951 Sex: male Admission Date (Current Location): 04/01/2021  [facility] and [place] Number:  Producer, television/film/video and Address:  [facility],  [address], [place] [address]      Provider Number: [id number]  Attending Physician Name and Address:  Burnadette Pop, MD  Relative Name and Phone Number:  Legal Guardian - [name] - # [phone]    Current Level of Care: Hospital Recommended Level of Care: Skilled Nursing Facility Prior Approval Number:    Date Approved/Denied:   PASRR Number:    Discharge Plan: SNF    Current Diagnoses: Patient Active Problem List   Diagnosis Date Noted  . Sepsis (HCC) 04/02/2021  . Lactic acidosis 04/02/2021  . Hyponatremia 04/02/2021  . Hyperkalemia 04/02/2021  . Chronic kidney disease, stage 3b (HCC) 04/02/2021  . Fever 04/02/2021  . Septic shock (HCC) 04/02/2021  . Altered mental status   . AKI (acute kidney injury) (HCC)   . CVA (cerebral vascular accident) (HCC) 10/07/2020  . Type 2 diabetes mellitus with stage 3 chronic kidney disease (HCC) 04/01/2020  . CAD (coronary artery disease), native coronary artery 04/01/2020  . NSTEMI (non-ST elevated myocardial infarction) (HCC) 03/31/2020  . Obesity, Class III, BMI 40-49.9 (morbid obesity) (HCC) 03/31/2020  . Aspiration pneumonia of both lower lobes due to gastric secretions (HCC) 03/21/2020  . Sacral decubitus ulcer, stage II (HCC) 03/19/2020  . Chronic diastolic CHF (congestive heart failure) (HCC) 03/17/2020  . Atrial flutter, paroxysmal (HCC) 12/27/2019  . Constipation 09/16/2018  . Bipolar 1 disorder, depressed (HCC) 08/22/2018  . Encephalopathy   . Paroxysmal atrial fibrillation (HCC) 06/18/2018  . History of pulmonary embolism 06/17/2018  . Generalized weakness 03/27/2018  . Diabetes mellitus type 2 in nonobese (HCC) 03/27/2018  .  Pulmonary embolism (HCC) 03/27/2018  . Fall   . Laceration of eyebrow   . Bipolar I disorder, most recent episode depressed, severe w psychosis (HCC) 12/22/2015  . Bipolar affective disorder, depressed, severe, with psychotic behavior (HCC)   . Bipolar I disorder, current or most recent episode depressed, with psychotic features (HCC)   . Severe bipolar I disorder with depression (HCC)   . Bipolar I disorder, most recent episode depressed, severe with psychotic features (HCC)   . Overdose 09/11/2015  . Acute respiratory failure with hypoxia (HCC) 09/05/2015  . Elevated troponin 09/05/2015  . Somnolence 09/05/2015  . Bipolar depression (HCC) 09/05/2015  . Acute bilateral deep vein thrombosis (DVT) of femoral veins (HCC) 09/05/2015  . Bilateral pulmonary embolism (HCC) 09/02/2015  . Labile hypertension   . Pulmonary embolism with acute cor pulmonale (HCC)   . Dyspnea   . Orthostatic hypotension   . Bipolar disorder, in partial remission, most recent episode depressed (HCC)   . Syncope, near 08/31/2015  . Affective psychosis, bipolar (HCC)   . Bipolar affective disorder, depressed, mild (HCC) 08/28/2015  . Pressure ulcer 07/30/2015  . Protein-calorie malnutrition, severe (HCC) 07/30/2015  . Dehydration 07/29/2015  . Diabetes (HCC) 07/09/2015  . UTI (urinary tract infection) 06/19/2014  . Positive blood culture 06/19/2014  . Type 2 diabetes mellitus with complication, without long-term current use of insulin (HCC) 06/02/2014  . Other and unspecified hyperlipidemia 06/02/2014  . OSA on CPAP 06/02/2014  . Bilateral lower extremity edema: chronic with venous stasis changes 06/02/2014  . Gout 06/02/2014  . Hypertension     Orientation RESPIRATION BLADDER Height &  Weight     Self,Time,Situation,Place  Normal Continent Weight: 250 lb 14.1 oz (113.8 kg) Height:  6' (182.9 cm)  BEHAVIORAL SYMPTOMS/MOOD NEUROLOGICAL BOWEL NUTRITION STATUS      Continent    AMBULATORY STATUS  COMMUNICATION OF NEEDS Skin   Limited Assist Verbally Surgical wounds                       Personal Care Assistance Level of Assistance  Bathing,Dressing Bathing Assistance: Limited assistance   Dressing Assistance: Limited assistance     Functional Limitations Info             SPECIAL CARE FACTORS FREQUENCY  PT (By licensed PT),OT (By licensed OT)     PT Frequency: 5x/wk OT Frequency: 5x/wk            Contractures Contractures Info: Not present    Additional Factors Info  Code Status,Allergies,Psychotropic Code Status Info: Full Allergies Info: see MAR Psychotropic Info: see MAR         Current Medications (04/05/2021):  This is the current hospital active medication list Current Facility-Administered Medications  Medication Dose Route Frequency Provider Last Rate Last Admin  . 0.9 %  sodium chloride infusion  250 mL Intravenous Continuous Marcelle Smiling, MD 10 mL/hr at 04/03/21 2322 250 mL at 04/03/21 2322  . acetaminophen (TYLENOL) tablet 650 mg  650 mg Oral Q6H PRN Marcelle Smiling, MD   650 mg at 04/04/21 1730  . cefdinir (OMNICEF) capsule 300 mg  300 mg Oral Q12H Burnadette Pop, MD   300 mg at 04/05/21 0958  . Chlorhexidine Gluconate Cloth 2 % PADS 6 each  6 each Topical Daily Marcelle Smiling, MD   6 each at 04/05/21 [phone]  . docusate sodium (COLACE) capsule 100 mg  100 mg Oral BID PRN Marcelle Smiling, MD   100 mg at 04/04/21 1252  . furosemide (LASIX) tablet 40 mg  40 mg Oral Daily Burnadette Pop, MD   40 mg at 04/05/21 1223  . insulin aspart (novoLOG) injection 0-9 Units  0-9 Units Subcutaneous Q4H Marcelle Smiling, MD   2 Units at 04/05/21 1223  . ondansetron (ZOFRAN) injection 4 mg  4 mg Intravenous Q6H PRN Marcelle Smiling, MD      . pantoprazole (PROTONIX) EC tablet 40 mg  40 mg Oral Daily Burnadette Pop, MD   40 mg at 04/05/21 1223  . polyethylene glycol (MIRALAX / GLYCOLAX) packet 17 g  17 g Oral Daily PRN Marcelle Smiling, MD      .  rivaroxaban Carlena Hurl) tablet 20 mg  20 mg Oral Q supper Marcelle Smiling, MD   20 mg at 04/04/21 1730     Discharge Medications: Please see discharge summary for a list of discharge medications.  Relevant Imaging Results:  Relevant Lab Results:   Additional Information SS#: [id number]  Amada Jupiter, LCSW

## 2021-04-05 NOTE — Progress Notes (Signed)
PROGRESS NOTE    Christian [name]  WUJ:[id number] DOB: [birth year] DOA: 04/01/2021 PCP: Deatra JamesSun, Vyvyan, MD   Chief Complain: Altered mental status  Brief Narrative: Patient is a 70 year old African-American male with history of CHF, chronic renal disease stage 3b, gout, hypertension, OSA on CPAP, hyperlipidemia, type 2 diabetes mellitus, bipolar 1 disorder, DVT who presented to the emergency department with complaints of altered mental status.  Also reported dysuria and bilateral lower EXTR swelling on  presentation.  History of noncompliance and not taking medication.  On presentation, initially was hypotensive, febrile.  Septic shock was suspected, had to be started on vasopressors and he was admitted to ICU.  Started on IV antibiotics. [facility]  Found to be volume overloaded, started on diuresis.  PT/OT recommended skilled nursing facilty on discharge.  TOC consulted.  Patient is medically stable for discharge to skilled nursing facility as soon  as bed is available.  Assessment & Plan:   Active Problems:   Type 2 diabetes mellitus with complication, without long-term current use of insulin (HCC)   Bipolar affective disorder, depressed, mild (HCC)   Sepsis (HCC)   Lactic acidosis   Hyponatremia   Hyperkalemia   Chronic kidney disease, stage 3b (HCC)   Fever   Septic shock (HCC)   Severe sepsis/suspected septic shock: Presented with hypotension.  Started on pressors on admission.  Source was thought to be UTI initially and was started on ceftriaxone.  Source is unclear follow-up cultures, currently afebrile and hemodynamically stable.  Urine culture was never sent.  Lactic acidosis  Has  resolved.  Leukocytosis resolved. Chest x-ray done on 04/02/21 showed possible left lower infiltrate but patient does not have any signs of pneumonia.  Abx discontinued.Sepsis ruled out  AKI on CKD stage IIIb: Currently kidney function close at baseline. Foley placed on  presentation now removed.   Patient looked severely volume overloaded.  Given few days of IV Lasix.  Currently on oral Lasix.  Acute on chronic diastolic congestive heart failure:Echo on 09/2020 showed ejection fraction of 65 to 70%, grade 1 diastolic dysfunction.  Patient looked volume overloaded, now improved with IV Lasix.Marland Kitchen.  BNP was elevated.  Echo shows ejection fraction of 55 to 60%, grade 1 diastolic dysfunction, no wall motion abnormality .continue Lasix 40 mg daily.  Monitor input/output, daily weight  Altered mental status: Most likely secondary to metabolic encephalopathy from sepsis.  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  Monitor blood sugars.  Hb A1c of 7.7  Obesity: BMI of 34  Generalized weakness/debility/deconditioning: PT/OT consulted and recommended skilled nursing facility on discharge         DVT prophylaxis: Xarelto Code Status: Full code Family Communication: None present at the bedside Status is: Inpatient  Remains inpatient appropriate because:Inpatient level of care appropriate due to severity of illness   Dispo: The patient is from: Home              Anticipated d/c is to: SNF              Patient currently is not medically stable to d/c.   Difficult to place patient No     Consultants: None  Procedures:None  Antimicrobials:  Anti-infectives (From admission, onward)   Start     Dose/Rate Route Frequency Ordered Stop   04/04/21 2200  cefdinir (OMNICEF) capsule 300 mg  300 mg Oral Every 12 hours 04/04/21 1123 04/06/21 2159   04/03/21 2200  cefTRIAXone (ROCEPHIN) 2 g in sodium chloride 0.9 % 100 mL IVPB  Status:  Discontinued        2 g 200 mL/hr over 30 Minutes Intravenous Every 24 hours 04/03/21 0809 04/04/21 1110   04/02/21 2200  cefTRIAXone (ROCEPHIN) 2 g in sodium chloride 0.9 % 100 mL  IVPB        2 g 200 mL/hr over 30 Minutes Intravenous  Once 04/02/21 1645 04/02/21 2206   04/01/21 2300  vancomycin (VANCOREADY) IVPB 2000 mg/400 mL        2,000 mg 200 mL/hr over 120 Minutes Intravenous  Once 04/01/21 2249 04/02/21 0240   04/01/21 2230  cefTRIAXone (ROCEPHIN) 2 g in sodium chloride 0.9 % 100 mL IVPB        2 g 200 mL/hr over 30 Minutes Intravenous  Once 04/01/21 2227 04/02/21 0023      Subjective: Patient seen and examined at bedside this morning.  Hemodynamically stable.  Comfortable.  Denies any new complaints  Objective: Vitals:   04/04/21 0418 04/04/21 1316 04/04/21 2028 04/05/21 0533  BP:  102/79 108/79 123/81  Pulse:  92 83 77  Resp:  18 20 20   Temp:  98 F (36.7 C) 98.2 F (36.8 C) 98.7 F (37.1 C)  TempSrc:  Oral Oral Oral  SpO2:  90% 96% 94%  Weight: 113.8 kg     Height:        Intake/Output Summary (Last 24 hours) at 04/05/2021 1132 Last data filed at 04/05/2021 0900 Gross per 24 hour  Intake 240 ml  Output 3400 ml  Net -3160 ml   Filed Weights   04/02/21 0800 04/03/21 0446 04/04/21 0418  Weight: 117.8 kg 117.2 kg 113.8 kg    Examination:  General exam: Overall comfortable, not in distress, obese HEENT: PERRL Respiratory system:  no wheezes or crackles  Cardiovascular system: S1 & S2 heard, RRR.  Gastrointestinal system: Abdomen is nondistended, soft and nontender. Central nervous system: Alert and oriented Extremities: No edema, no clubbing ,no cyanosis Skin: No rashes, no ulcers,no icterus     Data Reviewed: I have personally reviewed following labs and imaging studies  CBC: Recent Labs  Lab 04/01/21 1947 04/02/21 0455 04/03/21 0246 04/04/21 0457  WBC 16.6* 14.7* 9.6 5.6  NEUTROABS 14.9*  --  8.2* 3.8  HGB 14.7 12.7* 13.1 13.6  HCT 49.5 41.4 43.3 43.6  MCV 84.5 83.1 83.8 80.7  PLT 209 181 178 179   Basic Metabolic Panel: Recent Labs  Lab 04/01/21 1947 04/02/21 0455 04/03/21 0246 04/04/21 0457 04/05/21 0503  NA  131* 138 139 132* 135  K 5.3* 4.7 4.5 4.1 3.9  CL 100 107 109 97* 100  CO2 20* 22 20* 23 25  GLUCOSE 206* 109* 108* 124* 121*  BUN 38* 35* 37* 39* 40*  CREATININE 2.13* 1.88* 1.71* 1.66* 1.73*  CALCIUM 9.1 8.7* 9.4 9.1 9.2  MG  --  2.0 2.0  --   --   PHOS  --   --  2.4* 4.1  --    GFR: Estimated Creatinine Clearance: 54 mL/min (A) (by C-G formula based on SCr of 1.73 mg/dL (H)). Liver Function Tests: Recent Labs  Lab 04/01/21 1947 04/03/21 0246  AST 36 23  ALT 38 29  ALKPHOS 67 55  BILITOT 0.7 0.1*  PROT 7.5 6.7  ALBUMIN 3.5 3.1*   No results for input(s): LIPASE, AMYLASE in  the last 168 hours. No results for input(s): AMMONIA in the last 168 hours. Coagulation Profile: Recent Labs  Lab 04/01/21 2029  INR 1.6*   Cardiac Enzymes: No results for input(s): CKTOTAL, CKMB, CKMBINDEX, TROPONINI in the last 168 hours. BNP (last 3 results) No results for input(s): PROBNP in the last 8760 hours. HbA1C: Recent Labs    04/03/21 0246  HGBA1C 7.7*   CBG: Recent Labs  Lab 04/04/21 1713 04/04/21 2018 04/04/21 2338 04/05/21 0415 04/05/21 0755  GLUCAP 202* 188* 146* 130* 134*   Lipid Profile: No results for input(s): CHOL, HDL, LDLCALC, TRIG, CHOLHDL, LDLDIRECT in the last 72 hours. Thyroid Function Tests: No results for input(s): TSH, T4TOTAL, FREET4, T3FREE, THYROIDAB in the last 72 hours. Anemia Panel: No results for input(s): VITAMINB12, FOLATE, FERRITIN, TIBC, IRON, RETICCTPCT in the last 72 hours. Sepsis Labs: Recent Labs  Lab 04/01/21 1947 04/01/21 2208 04/02/21 0455 04/02/21 0808  LATICACIDVEN 2.1* 2.6* 1.1 0.9    Recent Results (from the past 240 hour(s))  Culture, blood (Routine x 2)     Status: None (Preliminary result)   Collection Time: 04/01/21  7:47 PM   Specimen: BLOOD RIGHT FOREARM  Result Value Ref Range Status   Specimen Description   Final    BLOOD RIGHT FOREARM Performed at [facility], [address]  [place], [place] [address]    Special Requests   Final    BOTTLES DRAWN AEROBIC AND ANAEROBIC Blood Culture results may not be optimal due to an inadequate volume of blood received in culture bottles Performed at [facility], [address], [place], [place] [address]    Culture   Final    NO GROWTH 3 DAYS Performed at [facility], [address] [place], [place] [address]    Report Status PENDING  Incomplete  Culture, blood (Routine x 2)     Status: None (Preliminary result)   Collection Time: 04/01/21  8:29 PM   Specimen: BLOOD LEFT WRIST  Result Value Ref Range Status   Specimen Description   Final    BLOOD LEFT WRIST Performed at [facility], [address] [place], [place] [address]    Special Requests   Final    BOTTLES DRAWN AEROBIC AND ANAEROBIC Blood Culture results may not be optimal due to an inadequate volume of blood received in culture bottles Performed at [facility], [address] [place], [place] [address]    Culture   Final    NO GROWTH 3 DAYS Performed at [facility], [address], [place], [place] [address]    Report Status PENDING  Incomplete  Resp Panel by RT-PCR (Flu A&B, Covid) Nasopharyngeal Swab     Status: None   Collection Time: 04/01/21  8:29 PM   Specimen: Nasopharyngeal Swab; Nasopharyngeal(NP) swabs in vial transport medium  Result Value Ref Range Status   SARS Coronavirus 2 by RT PCR NEGATIVE NEGATIVE Final    Comment: (NOTE) SARS-CoV-2 target nucleic acids are NOT DETECTED.  The SARS-CoV-2 RNA is generally detectable in upper respiratory specimens during the acute phase of infection. The lowest concentration of SARS-CoV-2 viral copies this assay can detect is 138 copies/mL. A negative result does not preclude SARS-Cov-2 infection and should not be used as the sole basis for treatment or other patient management decisions. A negative result may occur with  improper  specimen collection/handling, submission of specimen other than nasopharyngeal swab, presence of viral mutation(s)  within the areas targeted by this assay, and inadequate number of viral copies(<138 copies/mL). A negative result must be combined with clinical observations, patient history, and epidemiological information. The expected result is Negative.  Fact Sheet for Patients:  BloggerCourse.com  Fact Sheet for Healthcare Providers:  SeriousBroker.it  This test is no t yet approved or cleared by the [place] FDA and  has been authorized for detection and/or diagnosis of SARS-CoV-2 by FDA under an Emergency Use Authorization (EUA). This EUA will remain  in effect (meaning this test can be used) for the duration of the COVID-19 declaration under Section 564(b)(1) of the Act, 21 U.S.C.section 360bbb-3(b)(1), unless the authorization is terminated  or revoked sooner.       Influenza A by PCR NEGATIVE NEGATIVE Final   Influenza B by PCR NEGATIVE NEGATIVE Final    Comment: (NOTE) The Xpert Xpress SARS-CoV-2/FLU/RSV plus assay is intended as an aid in the diagnosis of influenza from Nasopharyngeal swab specimens and should not be used as a sole basis for treatment. Nasal washings and aspirates are unacceptable for Xpert Xpress SARS-CoV-2/FLU/RSV testing.  Fact Sheet for Patients: BloggerCourse.com  Fact Sheet for Healthcare Providers: SeriousBroker.it  This test is not yet approved or cleared by the [place] FDA and has been authorized for detection and/or diagnosis of SARS-CoV-2 by FDA under an Emergency Use Authorization (EUA). This EUA will remain in effect (meaning this test can be used) for the duration of the COVID-19 declaration under Section 564(b)(1) of the Act, 21 U.S.C. section 360bbb-3(b)(1), unless the authorization is terminated or revoked.  Performed at  [facility], [address] [place], [place] [address]   MRSA PCR Screening     Status: None   Collection Time: 04/02/21  7:48 AM   Specimen: Nasal Mucosa; Nasopharyngeal  Result Value Ref Range Status   MRSA by PCR NEGATIVE NEGATIVE Final    Comment:        The GeneXpert MRSA Assay (FDA approved for NASAL specimens only), is one component of a comprehensive MRSA colonization surveillance program. It is not intended to diagnose MRSA infection nor to guide or monitor treatment for MRSA infections. Performed at [facility], [address], [place], [place] [address]          Radiology Studies: ECHOCARDIOGRAM COMPLETE  Result Date: 04/03/2021    ECHOCARDIOGRAM REPORT   Patient Name:   Christian [name] Date of Exam: 04/03/2021 Medical Rec #:  [id number]        Height:       72.0 in Accession #:    [id number]       Weight:       258.4 lb Date of Birth:  [birth year]        BSA:          2.376 m Patient Age:    69 years         BP:           118/83 mmHg Patient Gender: M                HR:           77 bpm. Exam Location:  Inpatient Procedure: 2D Echo, Cardiac Doppler, Color Doppler and Intracardiac            Opacification Agent Indications:    I50.31 Acute diastolic (congestive) heart failure  History:        Patient has prior history of Echocardiogram examinations, most  recent 10/08/2020.  Sonographer:    Roosvelt Maser RDCS Referring Phys: [id number] Sanjay Broadfoot  Sonographer Comments: Technically difficult study due to poor echo windows. IMPRESSIONS  1. Left ventricular ejection fraction, by estimation, is 55 to 60%. Left ventricular ejection fraction by 2D MOD biplane is 56.5 %. The left ventricle has normal function. The left ventricle has no regional wall motion abnormalities. Left ventricular diastolic parameters are consistent with Grade I diastolic dysfunction (impaired relaxation).  2. Right ventricular systolic function is normal. The  right ventricular size is normal. Tricuspid regurgitation signal is inadequate for assessing PA pressure.  3. Left atrial size was mildly dilated.  4. The mitral valve is degenerative. No evidence of mitral valve regurgitation. No evidence of mitral stenosis.  5. The aortic valve is tricuspid. There is moderate calcification of the aortic valve. There is moderate thickening of the aortic valve. Aortic valve regurgitation is not visualized. Mild aortic valve sclerosis is present, with no evidence of aortic valve stenosis. Comparison(s): No significant change from prior study. FINDINGS  Left Ventricle: Left ventricular ejection fraction, by estimation, is 55 to 60%. Left ventricular ejection fraction by 2D MOD biplane is 56.5 %. The left ventricle has normal function. The left ventricle has no regional wall motion abnormalities. Definity contrast agent was given IV to delineate the left ventricular endocardial borders. The left ventricular internal cavity size was normal in size. There is no left ventricular hypertrophy. Left ventricular diastolic parameters are consistent with Grade I diastolic dysfunction (impaired relaxation). Right Ventricle: The right ventricular size is normal. No increase in right ventricular wall thickness. Right ventricular systolic function is normal. Tricuspid regurgitation signal is inadequate for assessing PA pressure. Left Atrium: Left atrial size was mildly dilated. Right Atrium: Right atrial size was normal in size. Pericardium: Trivial pericardial effusion is present. Presence of pericardial fat pad. Mitral Valve: The mitral valve is degenerative in appearance. Mild mitral annular calcification. No evidence of mitral valve regurgitation. No evidence of mitral valve stenosis. Tricuspid Valve: The tricuspid valve is grossly normal. Tricuspid valve regurgitation is not demonstrated. No evidence of tricuspid stenosis. Aortic Valve: The aortic valve is tricuspid. There is moderate  calcification of the aortic valve. There is moderate thickening of the aortic valve. Aortic valve regurgitation is not visualized. Mild aortic valve sclerosis is present, with no evidence of aortic valve stenosis. Aortic valve mean gradient measures 5.0 mmHg. Aortic valve peak gradient measures 8.3 mmHg. Aortic valve area, by VTI measures 2.33 cm. Pulmonic Valve: The pulmonic valve was not well visualized. Pulmonic valve regurgitation is not visualized. Aorta: The aortic root is normal in size and structure. Venous: The inferior vena cava was not well visualized. IAS/Shunts: The atrial septum is grossly normal.  LEFT VENTRICLE PLAX 2D                        Biplane EF (MOD) LVOT diam:     2.30 cm         LV Biplane EF:   Left LV SV:         58                               ventricular LV SV Index:   24                               ejection LVOT  Area:     4.15 cm                         fraction by                                                 2D MOD                                                 biplane is LV Volumes (MOD)                                56.5 %. LV vol d, MOD    59.2 ml A2C:                           Diastology LV vol d, MOD    53.4 ml       LV e' medial:    4.79 cm/s A4C:                           LV E/e' medial:  13.5 LV vol s, MOD    26.4 ml       LV e' lateral:   7.62 cm/s A2C:                           LV E/e' lateral: 8.5 LV vol s, MOD    22.8 ml A4C: LV SV MOD A2C:   32.8 ml LV SV MOD A4C:   53.4 ml LV SV MOD BP:    32.0 ml RIGHT VENTRICLE RV Basal diam:  3.46 cm RV S prime:     10.70 cm/s TAPSE (M-mode): 1.4 cm LEFT ATRIUM             Index       RIGHT ATRIUM           Index LA Vol (A2C):   99.5 ml 41.88 ml/m RA Area:     20.10 cm LA Vol (A4C):   94.4 ml 39.74 ml/m RA Volume:   56.10 ml  23.62 ml/m LA Biplane Vol: 99.7 ml 41.97 ml/m  AORTIC VALVE AV Area (Vmax):    2.13 cm AV Area (Vmean):   1.93 cm AV Area (VTI):     2.33 cm AV Vmax:           144.00 cm/s AV Vmean:           105.000 cm/s AV VTI:            0.248 m AV Peak Grad:      8.3 mmHg AV Mean Grad:      5.0 mmHg LVOT Vmax:         73.90 cm/s LVOT Vmean:        48.800 cm/s LVOT VTI:          0.139 m LVOT/AV VTI ratio: 0.56 MITRAL VALVE MV Area (PHT): 2.91 cm    SHUNTS MV Decel Time: 261 msec    Systemic VTI:  0.14 m  MV E velocity: 64.50 cm/s  Systemic Diam: 2.30 cm MV A velocity: 89.70 cm/s MV E/A ratio:  0.72 Lennie Odor MD Electronically signed by Lennie Odor MD Signature Date/Time: 04/03/2021/3:48:05 PM    Final         Scheduled Meds: . cefdinir  300 mg Oral Q12H  . Chlorhexidine Gluconate Cloth  6 each Topical Daily  . insulin aspart  0-9 Units Subcutaneous Q4H  . pantoprazole  40 mg Oral Daily  . rivaroxaban  20 mg Oral Q supper   Continuous Infusions: . sodium chloride 250 mL (04/03/21 2322)     LOS: 3 days    Time spent: 35 mins.More than 50% of that time was spent in counseling and/or coordination of care.      Burnadette Pop, MD [facility] P5/06/2021, 11:32 AM

## 2021-04-06 DIAGNOSIS — E039 Hypothyroidism, unspecified: Secondary | ICD-10-CM | POA: Diagnosis not present

## 2021-04-06 DIAGNOSIS — N1832 Chronic kidney disease, stage 3b: Secondary | ICD-10-CM | POA: Diagnosis not present

## 2021-04-06 DIAGNOSIS — Z86718 Personal history of other venous thrombosis and embolism: Secondary | ICD-10-CM | POA: Diagnosis not present

## 2021-04-06 DIAGNOSIS — R262 Difficulty in walking, not elsewhere classified: Secondary | ICD-10-CM | POA: Diagnosis not present

## 2021-04-06 DIAGNOSIS — E118 Type 2 diabetes mellitus with unspecified complications: Secondary | ICD-10-CM | POA: Diagnosis not present

## 2021-04-06 DIAGNOSIS — I5033 Acute on chronic diastolic (congestive) heart failure: Secondary | ICD-10-CM | POA: Diagnosis not present

## 2021-04-06 DIAGNOSIS — Z743 Need for continuous supervision: Secondary | ICD-10-CM | POA: Diagnosis not present

## 2021-04-06 DIAGNOSIS — I10 Essential (primary) hypertension: Secondary | ICD-10-CM | POA: Diagnosis not present

## 2021-04-06 DIAGNOSIS — A419 Sepsis, unspecified organism: Secondary | ICD-10-CM | POA: Diagnosis not present

## 2021-04-06 DIAGNOSIS — F319 Bipolar disorder, unspecified: Secondary | ICD-10-CM | POA: Diagnosis not present

## 2021-04-06 DIAGNOSIS — I480 Paroxysmal atrial fibrillation: Secondary | ICD-10-CM | POA: Diagnosis not present

## 2021-04-06 DIAGNOSIS — R6520 Severe sepsis without septic shock: Secondary | ICD-10-CM | POA: Diagnosis not present

## 2021-04-06 DIAGNOSIS — R5381 Other malaise: Secondary | ICD-10-CM | POA: Diagnosis not present

## 2021-04-06 DIAGNOSIS — R6521 Severe sepsis with septic shock: Secondary | ICD-10-CM | POA: Diagnosis not present

## 2021-04-06 DIAGNOSIS — N179 Acute kidney failure, unspecified: Secondary | ICD-10-CM | POA: Diagnosis not present

## 2021-04-06 DIAGNOSIS — E785 Hyperlipidemia, unspecified: Secondary | ICD-10-CM | POA: Diagnosis not present

## 2021-04-06 DIAGNOSIS — R279 Unspecified lack of coordination: Secondary | ICD-10-CM | POA: Diagnosis not present

## 2021-04-06 DIAGNOSIS — M6281 Muscle weakness (generalized): Secondary | ICD-10-CM | POA: Diagnosis not present

## 2021-04-06 DIAGNOSIS — I9589 Other hypotension: Secondary | ICD-10-CM | POA: Diagnosis not present

## 2021-04-06 DIAGNOSIS — E119 Type 2 diabetes mellitus without complications: Secondary | ICD-10-CM | POA: Diagnosis not present

## 2021-04-06 DIAGNOSIS — F3131 Bipolar disorder, current episode depressed, mild: Secondary | ICD-10-CM | POA: Diagnosis not present

## 2021-04-06 LAB — GLUCOSE, CAPILLARY
Glucose-Capillary: 131 mg/dL — ABNORMAL HIGH (ref 70–99)
Glucose-Capillary: 105 mg/dL — ABNORMAL HIGH (ref 70–99)
Glucose-Capillary: 188 mg/dL — ABNORMAL HIGH (ref 70–99)
Glucose-Capillary: 174 mg/dL — ABNORMAL HIGH (ref 70–99)

## 2021-04-06 LAB — RESP PANEL BY RT-PCR (FLU A&B, COVID) ARPGX2
Influenza A by PCR: NEGATIVE
Influenza B by PCR: NEGATIVE
SARS Coronavirus 2 by RT PCR: NEGATIVE

## 2021-04-06 MED ORDER — METOPROLOL SUCCINATE ER 25 MG PO TB24
50.0000 mg | ORAL_TABLET | Freq: Every day | ORAL | Status: DC
Start: 2021-04-06 — End: 2022-01-29

## 2021-04-06 MED ORDER — FUROSEMIDE 40 MG PO TABS: 40.0000 mg | ORAL_TABLET | Freq: Every day | ORAL | Status: DC

## 2021-04-06 NOTE — Care Management Important Message (Signed)
Medicare IM printed remotely for Social Work team to give to the patient. 

## 2021-04-06 NOTE — NC FL2 (Signed)
[place] MEDICAID FL2 LEVEL OF CARE SCREENING TOOL     IDENTIFICATION  Patient Name: Christian [name] Birthdate: February 05, 1951 Sex: male Admission Date (Current Location): 04/01/2021  [facility] and [place] Number:  Producer, television/film/video and Address:  [facility],  [address] [place] [address]      Provider Number: [id number]  Attending Physician Name and Address:  Burnadette Pop, MD  Relative Name and Phone Number:  Legal Guardian - [name] - # [phone]    Current Level of Care: Hospital Recommended Level of Care: Skilled Nursing Facility Prior Approval Number:    Date Approved/Denied:   PASRR Number: [id number]  Discharge Plan: SNF    Current Diagnoses: Patient Active Problem List   Diagnosis Date Noted  . Sepsis (HCC) 04/02/2021  . Lactic acidosis 04/02/2021  . Hyponatremia 04/02/2021  . Hyperkalemia 04/02/2021  . Chronic kidney disease, stage 3b (HCC) 04/02/2021  . Fever 04/02/2021  . Septic shock (HCC) 04/02/2021  . Altered mental status   . AKI (acute kidney injury) (HCC)   . CVA (cerebral vascular accident) (HCC) 10/07/2020  . Type 2 diabetes mellitus with stage 3 chronic kidney disease (HCC) 04/01/2020  . CAD (coronary artery disease), native coronary artery 04/01/2020  . NSTEMI (non-ST elevated myocardial infarction) (HCC) 03/31/2020  . Obesity, Class III, BMI 40-49.9 (morbid obesity) (HCC) 03/31/2020  . Aspiration pneumonia of both lower lobes due to gastric secretions (HCC) 03/21/2020  . Sacral decubitus ulcer, stage II (HCC) 03/19/2020  . Chronic diastolic CHF (congestive heart failure) (HCC) 03/17/2020  . Atrial flutter, paroxysmal (HCC) 12/27/2019  . Constipation 09/16/2018  . Bipolar 1 disorder, depressed (HCC) 08/22/2018  . Encephalopathy   . Paroxysmal atrial fibrillation (HCC) 06/18/2018  . History of pulmonary embolism 06/17/2018  . Generalized weakness 03/27/2018  . Diabetes mellitus type 2 in nonobese (HCC)  03/27/2018  . Pulmonary embolism (HCC) 03/27/2018  . Fall   . Laceration of eyebrow   . Bipolar I disorder, most recent episode depressed, severe w psychosis (HCC) 12/22/2015  . Bipolar affective disorder, depressed, severe, with psychotic behavior (HCC)   . Bipolar I disorder, current or most recent episode depressed, with psychotic features (HCC)   . Severe bipolar I disorder with depression (HCC)   . Bipolar I disorder, most recent episode depressed, severe with psychotic features (HCC)   . Overdose 09/11/2015  . Acute respiratory failure with hypoxia (HCC) 09/05/2015  . Elevated troponin 09/05/2015  . Somnolence 09/05/2015  . Bipolar depression (HCC) 09/05/2015  . Acute bilateral deep vein thrombosis (DVT) of femoral veins (HCC) 09/05/2015  . Bilateral pulmonary embolism (HCC) 09/02/2015  . Labile hypertension   . Pulmonary embolism with acute cor pulmonale (HCC)   . Dyspnea   . Orthostatic hypotension   . Bipolar disorder, in partial remission, most recent episode depressed (HCC)   . Syncope, near 08/31/2015  . Affective psychosis, bipolar (HCC)   . Bipolar affective disorder, depressed, mild (HCC) 08/28/2015  . Pressure ulcer 07/30/2015  . Protein-calorie malnutrition, severe (HCC) 07/30/2015  . Dehydration 07/29/2015  . Diabetes (HCC) 07/09/2015  . UTI (urinary tract infection) 06/19/2014  . Positive blood culture 06/19/2014  . Type 2 diabetes mellitus with complication, without long-term current use of insulin (HCC) 06/02/2014  . Other and unspecified hyperlipidemia 06/02/2014  . OSA on CPAP 06/02/2014  . Bilateral lower extremity edema: chronic with venous stasis changes 06/02/2014  . Gout 06/02/2014  . Hypertension     Orientation RESPIRATION BLADDER Height &  Weight     Self,Time,Situation,Place  Normal Continent Weight: 110.7 kg Height:  6' (182.9 cm)  BEHAVIORAL SYMPTOMS/MOOD NEUROLOGICAL BOWEL NUTRITION STATUS      Continent    AMBULATORY STATUS COMMUNICATION  OF NEEDS Skin   Limited Assist Verbally Surgical wounds                       Personal Care Assistance Level of Assistance  Bathing,Dressing Bathing Assistance: Limited assistance   Dressing Assistance: Limited assistance     Functional Limitations Info             SPECIAL CARE FACTORS FREQUENCY  PT (By licensed PT),OT (By licensed OT)     PT Frequency: 5x/wk OT Frequency: 5x/wk            Contractures Contractures Info: Not present    Additional Factors Info  Code Status,Allergies,Psychotropic Code Status Info: Full Allergies Info: see MAR Psychotropic Info: see MAR         Current Medications (04/06/2021):  This is the current hospital active medication list Current Facility-Administered Medications  Medication Dose Route Frequency Provider Last Rate Last Admin  . 0.9 %  sodium chloride infusion  250 mL Intravenous Continuous Marcelle Smiling, MD 10 mL/hr at 04/03/21 2322 250 mL at 04/03/21 2322  . acetaminophen (TYLENOL) tablet 650 mg  650 mg Oral Q6H PRN Marcelle Smiling, MD   650 mg at 04/04/21 1730  . Chlorhexidine Gluconate Cloth 2 % PADS 6 each  6 each Topical Daily Marcelle Smiling, MD   6 each at 04/06/21 1003  . docusate sodium (COLACE) capsule 100 mg  100 mg Oral BID PRN Marcelle Smiling, MD   100 mg at 04/06/21 1002  . furosemide (LASIX) tablet 40 mg  40 mg Oral Daily Burnadette Pop, MD   40 mg at 04/06/21 1002  . insulin aspart (novoLOG) injection 0-9 Units  0-9 Units Subcutaneous Q4H Marcelle Smiling, MD   1 Units at 04/06/21 0423  . ondansetron (ZOFRAN) injection 4 mg  4 mg Intravenous Q6H PRN Marcelle Smiling, MD      . pantoprazole (PROTONIX) EC tablet 40 mg  40 mg Oral Daily Burnadette Pop, MD   40 mg at 04/06/21 1002  . polyethylene glycol (MIRALAX / GLYCOLAX) packet 17 g  17 g Oral Daily PRN Marcelle Smiling, MD   17 g at 04/06/21 1002  . rivaroxaban (XARELTO) tablet 20 mg  20 mg Oral Q supper Marcelle Smiling, MD   20 mg at 04/05/21 1741      Discharge Medications: Please see discharge summary for a list of discharge medications.  Relevant Imaging Results:  Relevant Lab Results:   Additional Information SS#: [id number]  Lanier Clam, RN

## 2021-04-06 NOTE — Progress Notes (Signed)
PROGRESS NOTE    Christian [name]  BPZ:[id number] DOB: 09-24-1951 DOA: 04/01/2021 PCP: [name], MD   Chief Complain: Altered mental status  Brief Narrative: Patient is a 70 year old African-American male with history of CHF, chronic renal disease stage 3b, gout, hypertension, OSA on CPAP, hyperlipidemia, type 2 diabetes mellitus, bipolar 1 disorder, DVT who presented to the emergency department with complaints of altered mental status.  Also reported dysuria and bilateral lower EXTR swelling on  presentation.  History of noncompliance and not taking medication.  On presentation, initially was hypotensive, febrile.  Septic shock was suspected, had to be started on vasopressors and he was admitted to ICU.  Started on IV antibiotics. patient transferred to [facility] service on 04/03/21.  Found to be volume overloaded, started on diuresis.  PT/OT recommended skilled nursing facilty on discharge.  TOC consulted.  Patient is medically stable for discharge to skilled nursing facility as soon  as bed is available.  Assessment & Plan:   Active Problems:   Type 2 diabetes mellitus with complication, without long-term current use of insulin (HCC)   Bipolar affective disorder, depressed, mild (HCC)   Sepsis (HCC)   Lactic acidosis   Hyponatremia   Hyperkalemia   Chronic kidney disease, stage 3b (HCC)   Fever   Septic shock (HCC)   Severe sepsis/suspected septic shock: Presented with hypotension.  Started on pressors on admission.  Source was thought to be UTI initially and was started on ceftriaxone.  Source is unclear follow-up cultures, currently afebrile and hemodynamically stable.  Urine culture was never sent.  Lactic acidosis  Has  resolved.  Leukocytosis resolved. Chest x-ray done on 04/02/21 showed possible left lower infiltrate but patient does not have any signs of pneumonia.  Abx discontinued.Sepsis ruled out  AKI on CKD stage IIIb: Currently kidney function close at baseline. Foley placed on  presentation now removed.   Patient looked severely volume overloaded.  Given few days of IV Lasix.  Currently on oral Lasix.  Acute on chronic diastolic congestive heart failure:Echo on 09/2020 showed ejection fraction of 65 to 70%, grade 1 diastolic dysfunction.  Patient looked volume overloaded, now improved with IV Lasix.Marland Kitchen  BNP was elevated.  Echo shows ejection fraction of 55 to 60%, grade 1 diastolic dysfunction, no wall motion abnormality .continue Lasix 40 mg daily.  Monitor input/output, daily weight  Altered mental status: Most likely secondary to metabolic encephalopathy from sepsis.  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  Monitor blood sugars.  Hb A1c of 7.7  Obesity: BMI of 34  Generalized weakness/debility/deconditioning: PT/OT consulted and recommended skilled nursing facility on discharge         DVT prophylaxis: Xarelto Code Status: Full code Family Communication: Daughter on phone Status is: Inpatient  Remains inpatient appropriate because:Inpatient level of care appropriate due to severity of illness   Dispo: The patient is from: Home              Anticipated d/c is to: SNF              Patient currently is not medically stable to d/c.   Difficult to place patient No     Consultants: None  Procedures:None  Antimicrobials:  Anti-infectives (From admission, onward)   Start     Dose/Rate Route Frequency Ordered Stop   04/04/21 2200  cefdinir (OMNICEF) capsule 300 mg  300 mg Oral Every 12 hours 04/04/21 1123 04/06/21 1002   04/03/21 2200  cefTRIAXone (ROCEPHIN) 2 g in sodium chloride 0.9 % 100 mL IVPB  Status:  Discontinued        2 g 200 mL/hr over 30 Minutes Intravenous Every 24 hours 04/03/21 0809 04/04/21 1110   04/02/21 2200  cefTRIAXone (ROCEPHIN) 2 g in sodium chloride 0.9 % 100 mL IVPB         2 g 200 mL/hr over 30 Minutes Intravenous  Once 04/02/21 1645 04/02/21 2206   04/01/21 2300  vancomycin (VANCOREADY) IVPB 2000 mg/400 mL        2,000 mg 200 mL/hr over 120 Minutes Intravenous  Once 04/01/21 2249 04/02/21 0240   04/01/21 2230  cefTRIAXone (ROCEPHIN) 2 g in sodium chloride 0.9 % 100 mL IVPB        2 g 200 mL/hr over 30 Minutes Intravenous  Once 04/01/21 2227 04/02/21 0023      Subjective: Patient seen and examined the bedside this morning.  Comfortable.  No new complaints  Objective: Vitals:   04/05/21 1506 04/05/21 2154 04/06/21 0417 04/06/21 0425  BP: 132/83 117/79 109/80   Pulse: 83 83 79   Resp: 18 20 20    Temp: 98.7 F (37.1 C) 99.3 F (37.4 C) 98.2 F (36.8 C)   TempSrc: Oral Oral Oral   SpO2: 93% 94% 98%   Weight:    110.7 kg  Height:        Intake/Output Summary (Last 24 hours) at 04/06/2021 1144 Last data filed at 04/06/2021 06/06/2021 Gross per 24 hour  Intake 480 ml  Output 3000 ml  Net -2520 ml   Filed Weights   04/03/21 0446 04/04/21 0418 04/06/21 0425  Weight: 117.2 kg 113.8 kg 110.7 kg    Examination:  General exam: Overall comfortable, not in distress, obese HEENT: PERRL Respiratory system:  no wheezes or crackles  Cardiovascular system: S1 & S2 heard, RRR.  Gastrointestinal system: Abdomen is nondistended, soft and nontender. Central nervous system: Alert and oriented Extremities: No edema, no clubbing ,no cyanosis Skin: No rashes, no ulcers,no icterus    Data Reviewed: I have personally reviewed following labs and imaging studies  CBC: Recent Labs  Lab 04/01/21 1947 04/02/21 0455 04/03/21 0246 04/04/21 0457  WBC 16.6* 14.7* 9.6 5.6  NEUTROABS 14.9*  --  8.2* 3.8  HGB 14.7 12.7* 13.1 13.6  HCT 49.5 41.4 43.3 43.6  MCV 84.5 83.1 83.8 80.7  PLT 209 181 178 179   Basic Metabolic Panel: Recent Labs  Lab 04/01/21 1947 04/02/21 0455 04/03/21 0246 04/04/21 0457 04/05/21 0503  NA 131* 138 139 132* 135  K 5.3* 4.7 4.5 4.1 3.9   CL 100 107 109 97* 100  CO2 20* 22 20* 23 25  GLUCOSE 206* 109* 108* 124* 121*  BUN 38* 35* 37* 39* 40*  CREATININE 2.13* 1.88* 1.71* 1.66* 1.73*  CALCIUM 9.1 8.7* 9.4 9.1 9.2  MG  --  2.0 2.0  --   --   PHOS  --   --  2.4* 4.1  --    GFR: Estimated Creatinine Clearance: 53.2 mL/min (A) (by C-G formula based on SCr of 1.73 mg/dL (H)). Liver Function Tests: Recent Labs  Lab 04/01/21 1947 04/03/21 0246  AST 36 23  ALT 38 29  ALKPHOS 67 55  BILITOT 0.7 0.1*  PROT 7.5 6.7  ALBUMIN 3.5 3.1*   No results for input(s): LIPASE, AMYLASE in the last 168 hours. No  results for input(s): AMMONIA in the last 168 hours. Coagulation Profile: Recent Labs  Lab 04/01/21 2029  INR 1.6*   Cardiac Enzymes: No results for input(s): CKTOTAL, CKMB, CKMBINDEX, TROPONINI in the last 168 hours. BNP (last 3 results) No results for input(s): PROBNP in the last 8760 hours. HbA1C: No results for input(s): HGBA1C in the last 72 hours. CBG: Recent Labs  Lab 04/05/21 1724 04/05/21 2015 04/05/21 2357 04/06/21 0417 04/06/21 0733  GLUCAP 146* 180* 155* 131* 105*   Lipid Profile: No results for input(s): CHOL, HDL, LDLCALC, TRIG, CHOLHDL, LDLDIRECT in the last 72 hours. Thyroid Function Tests: No results for input(s): TSH, T4TOTAL, FREET4, T3FREE, THYROIDAB in the last 72 hours. Anemia Panel: No results for input(s): VITAMINB12, FOLATE, FERRITIN, TIBC, IRON, RETICCTPCT in the last 72 hours. Sepsis Labs: Recent Labs  Lab 04/01/21 1947 04/01/21 2208 04/02/21 0455 04/02/21 0808  LATICACIDVEN 2.1* 2.6* 1.1 0.9    Recent Results (from the past 240 hour(s))  Culture, blood (Routine x 2)     Status: None (Preliminary result)   Collection Time: 04/01/21  7:47 PM   Specimen: BLOOD RIGHT FOREARM  Result Value Ref Range Status   Specimen Description   Final    BLOOD RIGHT FOREARM Performed at [facility], [address], [place], [place] [address]    Special Requests    Final    BOTTLES DRAWN AEROBIC AND ANAEROBIC Blood Culture results may not be optimal due to an inadequate volume of blood received in culture bottles Performed at [facility], [address] [place], [place] [address]    Culture   Final    NO GROWTH 4 DAYS Performed at [facility], [address], [place], [place] [address]    Report Status PENDING  Incomplete  Culture, blood (Routine x 2)     Status: None (Preliminary result)   Collection Time: 04/01/21  8:29 PM   Specimen: BLOOD LEFT WRIST  Result Value Ref Range Status   Specimen Description   Final    BLOOD LEFT WRIST Performed at [facility], [address], [place], [place] [address]    Special Requests   Final    BOTTLES DRAWN AEROBIC AND ANAEROBIC Blood Culture results may not be optimal due to an inadequate volume of blood received in culture bottles Performed at [facility], [address] [place], [place] [address]    Culture   Final    NO GROWTH 4 DAYS Performed at [facility], [address], [place], [place] [address]    Report Status PENDING  Incomplete  Resp Panel by RT-PCR (Flu A&B, Covid) Nasopharyngeal Swab     Status: None   Collection Time: 04/01/21  8:29 PM   Specimen: Nasopharyngeal Swab; Nasopharyngeal(NP) swabs in vial transport medium  Result Value Ref Range Status   SARS Coronavirus 2 by RT PCR NEGATIVE NEGATIVE Final    Comment: (NOTE) SARS-CoV-2 target nucleic acids are NOT DETECTED.  The SARS-CoV-2 RNA is generally detectable in upper respiratory specimens during the acute phase of infection. The lowest concentration of SARS-CoV-2 viral copies this assay can detect is 138 copies/mL. A negative result does not preclude SARS-Cov-2 infection and should not be used as the sole basis for treatment or other patient management decisions. A negative result may occur with  improper specimen collection/handling, submission of  specimen other than nasopharyngeal swab, presence of viral mutation(s) within the areas targeted by this assay,  and inadequate number of viral copies(<138 copies/mL). A negative result must be combined with clinical observations, patient history, and epidemiological information. The expected result is Negative.  Fact Sheet for Patients:  BloggerCourse.com  Fact Sheet for Healthcare Providers:  SeriousBroker.it  This test is no t yet approved or cleared by the [place] FDA and  has been authorized for detection and/or diagnosis of SARS-CoV-2 by FDA under an Emergency Use Authorization (EUA). This EUA will remain  in effect (meaning this test can be used) for the duration of the COVID-19 declaration under Section 564(b)(1) of the Act, 21 U.S.C.section 360bbb-3(b)(1), unless the authorization is terminated  or revoked sooner.       Influenza A by PCR NEGATIVE NEGATIVE Final   Influenza B by PCR NEGATIVE NEGATIVE Final    Comment: (NOTE) The Xpert Xpress SARS-CoV-2/FLU/RSV plus assay is intended as an aid in the diagnosis of influenza from Nasopharyngeal swab specimens and should not be used as a sole basis for treatment. Nasal washings and aspirates are unacceptable for Xpert Xpress SARS-CoV-2/FLU/RSV testing.  Fact Sheet for Patients: BloggerCourse.com  Fact Sheet for Healthcare Providers: SeriousBroker.it  This test is not yet approved or cleared by the [place] FDA and has been authorized for detection and/or diagnosis of SARS-CoV-2 by FDA under an Emergency Use Authorization (EUA). This EUA will remain in effect (meaning this test can be used) for the duration of the COVID-19 declaration under Section 564(b)(1) of the Act, 21 U.S.C. section 360bbb-3(b)(1), unless the authorization is terminated or revoked.  Performed at [facility], [address] [place], [place] [address]   MRSA PCR Screening     Status: None   Collection Time: 04/02/21  7:48 AM   Specimen: Nasal Mucosa; Nasopharyngeal  Result Value Ref Range Status   MRSA by PCR NEGATIVE NEGATIVE Final    Comment:        The GeneXpert MRSA Assay (FDA approved for NASAL specimens only), is one component of a comprehensive MRSA colonization surveillance program. It is not intended to diagnose MRSA infection nor to guide or monitor treatment for MRSA infections. Performed at [facility], [address], [place], [place] [address]          Radiology Studies: No results found.      Scheduled Meds: . Chlorhexidine Gluconate Cloth  6 each Topical Daily  . furosemide  40 mg Oral Daily  . insulin aspart  0-9 Units Subcutaneous Q4H  . pantoprazole  40 mg Oral Daily  . rivaroxaban  20 mg Oral Q supper   Continuous Infusions: . sodium chloride 250 mL (04/03/21 2322)     LOS: 4 days    Time spent: 35 mins.More than 50% of that time was spent in counseling and/or coordination of care.      Burnadette Pop, MD [facility] P5/07/2021, 11:44 AM

## 2021-04-06 NOTE — Final Progress Note (Signed)
Report given to [facility].Marland Kitchen

## 2021-04-06 NOTE — Progress Notes (Signed)
Physical Therapy Treatment Patient Details Name: Christian [name] MRN: [id number] DOB: 10-14-1951 Today's Date: 04/06/2021    History of Present Illness 70 yo male admitted with sepsis. Hx of DM, CKD, CAD, PAF, OSA, DVT, bipolar, gout    PT Comments    Pt continues to report weakness and fatigue. He was unable to walk back to the room on today so used recliner to transport back to room. He remains at risk for falls when mobilizing. Pt c/o moderate neck pain. Will continue to follow and progress activity as tolerated. Continue to recommend ST SNF for rehab to improve strength/functional mobility and to regain independence.    Follow Up Recommendations  SNF     Equipment Recommendations  None recommended by PT    Recommendations for Other Services       Precautions / Restrictions Precautions Precautions: Fall Restrictions Weight Bearing Restrictions: No    Mobility  Bed Mobility Overal bed mobility: Needs Assistance Bed Mobility: Supine to Sit     Supine to sit: Supervision     General bed mobility comments: increased time to transfer to side of bed. cues required.    Transfers Overall transfer level: Needs assistance Equipment used: Rolling walker (2 wheeled) Transfers: Sit to/from Stand Sit to Stand: Min assist;From elevated surface         General transfer comment: Assist to power up from elevated bed height. Cues for safety, technique, hand placement  Ambulation/Gait Ambulation/Gait assistance: Min assist Gait Distance (Feet): 25 Feet Assistive device: Rolling walker (2 wheeled) Gait Pattern/deviations: Decreased step length - right;Decreased step length - left;Decreased stride length;Trunk flexed     General Gait Details: Assist to stabilize pt throughout distance. Followed closely with recliner. Again observed shaking and "bouncing" in knees likely due to weakness. Dyspnea 2/4. Pt reports feeling weak and fatigued.   Stairs              Wheelchair Mobility    Modified Rankin (Stroke Patients Only)       Balance Overall balance assessment: Needs assistance         Standing balance support: Bilateral upper extremity supported Standing balance-Leahy Scale: Poor                              Cognition Arousal/Alertness: Awake/alert Behavior During Therapy: Flat affect Overall Cognitive Status: Within Functional Limits for tasks assessed                                        Exercises Total Joint Exercises Long Arc Quad: AROM;Both;15 reps;Seated Marching in Standing: AROM;Both;15 reps;Seated    General Comments        Pertinent Vitals/Pain Pain Assessment: Faces Faces Pain Scale: Hurts little more Pain Location: neck Pain Descriptors / Indicators: Discomfort;Sore Pain Intervention(s): Heat applied    Home Living                      Prior Function            PT Goals (current goals can now be found in the care plan section) Progress towards PT goals: Progressing toward goals    Frequency    Min 3X/week      PT Plan Current plan remains appropriate    Co-evaluation  AM-PAC PT "6 Clicks" Mobility   Outcome Measure  Help needed turning from your back to your side while in a flat bed without using bedrails?: None Help needed moving from lying on your back to sitting on the side of a flat bed without using bedrails?: None Help needed moving to and from a bed to a chair (including a wheelchair)?: A Little Help needed standing up from a chair using your arms (e.g., wheelchair or bedside chair)?: A Little Help needed to walk in hospital room?: A Little Help needed climbing 3-5 steps with a railing? : A Lot 6 Click Score: 19    End of Session Equipment Utilized During Treatment: Gait belt Activity Tolerance: Patient limited by fatigue Patient left: in chair;with call bell/phone within reach;with chair alarm set   PT Visit  Diagnosis: Unsteadiness on feet (R26.81);Muscle weakness (generalized) (M62.81);Difficulty in walking, not elsewhere classified (R26.2)     Time: 9458-5929 PT Time Calculation (min) (ACUTE ONLY): 17 min  Charges:  $Gait Training: 8-22 mins                        Faye Ramsay, PT Acute Rehabilitation  Office: [phone] Pager: [phone]

## 2021-04-06 NOTE — Discharge Summary (Signed)
Physician Discharge Summary  Tirth Cothron Reno WIO:[id number] DOB: 02-24-1951 DOA: 04/01/2021  PCP: [name], MD  Admit date: 04/01/2021 Discharge date: 04/06/2021  Admitted From: Home Disposition:  Home  Discharge Condition:Stable CODE STATUS:FULL Diet recommendation: Carb Modified  Brief/Interim Summary:  Patient is a 70 year old African-American male with history of CHF, chronic renal disease stage 3b, gout, hypertension,  hyperlipidemia, type 2 diabetes mellitus, bipolar 1 disorder, DVT /PE, coronary disease status post PCI, paroxysmal A. fib who presented to the emergency department with complaints of altered mental status.  Also reported dysuria and bilateral lower extremity swelling on  presentation.  History of noncompliance and not taking medication.  On presentation,he  was hypotensive, febrile.  Septic shock was suspected, had to be started on vasopressors and he was admitted to ICU.  Started on IV antibiotics.  Culture did not reveal anything.  Patient transferred to [facility] service on 04/03/21.  Found to be volume overloaded, started on IV diuresis.  PT/OT recommended skilled nursing facilty on discharge.    Hemodynamically stable for discharge today.  Following problems were addressed during his hospitalization:  Severe sepsis/suspected septic shock: Presented with hypotension.  Started on pressors on admission.  Source was thought to be UTI initially and was started on ceftriaxone.  Source is unclear Currently afebrile and hemodynamically stable.  Urine culture was never sent at ICU.  Lactic acidosis  Has  resolved.  Leukocytosis resolved.  Blood cultures did not reveal anything. Chest x-ray done on 04/02/21 showed possible left lower infiltrate but patient does not have any signs of pneumonia.    He finished antibiotics course.  AKI on CKD stage IIIb: Currently kidney function close at baseline. Foley placed on presentation, now removed.   Patient looked severely volume overloaded when I  received the patient from ICU service.  Given few doses of IV Lasix.  Currently on oral Lasix.  Continue 40 mg on DC.  Acute on chronic diastolic congestive heart failure:Echo on 09/2020 showed ejection fraction of 65 to 70%, grade 1 diastolic dysfunction.  Patient looked volume overloaded, now improved with IV Lasix.Marland Kitchen  BNP was elevated.  Echo shows ejection fraction of 55 to 60%, grade 1 diastolic dysfunction, no wall motion abnormality .continue Lasix 40 mg daily.    Altered mental status: Most likely secondary to metabolic encephalopathy from sepsis.  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 fib: Currently normal sinus rhythm.  On Xarelto for anticoagulation.  On metoprolol for rate control.  History of coronary artery disease status post PCI: Follows at [facility] with cardiology.  On Plavix.  Hypophosphatemia: Supplemented and corrected  Diabetes mellitus:  Resume home regimen.  Recent Hb A1c of 7.7  Obesity: BMI of 34  Generalized weakness/debility/deconditioning: PT/OT consulted and recommended skilled nursing facility on discharge       Discharge Diagnoses:  Active Problems:   Type 2 diabetes mellitus with complication, without long-term current use of insulin (HCC)   Bipolar affective disorder, depressed, mild (HCC)   Sepsis (HCC)   Lactic acidosis   Hyponatremia   Hyperkalemia   Chronic kidney disease, stage 3b (HCC)   Fever   Septic shock (HCC)    Discharge Instructions  Discharge Instructions    Diet Carb Modified   Complete by: As directed    Discharge instructions   Complete by: As directed    1)Take medications as instructed 2)Do a BMP test in a week   Increase  activity slowly   Complete by: As directed      Allergies as of 04/06/2021      Reactions   Amlodipine Swelling, Other (See Comments)   Legs became swollen   Onglyza [saxagliptin] Other (See Comments)    [facility]," [facility]   Zyprexa [olanzapine] Other (See Comments)   Allergy noted by ACT Team. No reaction specified (??)- "All[facility]," [facility]      Medication List    STOP taking these medications   insulin aspart 100 UNIT/ML injection Commonly known as: novoLOG   lisinopril 20 MG tablet Commonly known as: ZESTRIL   metoprolol tartrate 25 MG tablet Commonly known as: LOPRESSOR     TAKE these medications   ARIPiprazole 20 MG tablet Commonly known as: ABILIFY Take 1 tablet (20 mg total) by mouth daily.   atorvastatin 10 MG tablet Commonly known as: LIPITOR Take 1 tablet (10 mg total) by mouth daily at 6 PM. What changed: when to take this   buPROPion 75 MG tablet Commonly known as: WELLBUTRIN Take 1 tablet (75 mg total) by mouth daily.   clopidogrel 75 MG tablet Commonly known as: PLAVIX Take 75 mg by mouth daily.   divalproex 500 MG DR tablet Commonly known as: DEPAKOTE Take 1 tablet (500 mg total) by mouth every 12 (twelve) hours.   empagliflozin 10 MG Tabs tablet Commonly known as: JARDIANCE Take 10 mg by mouth daily.   furosemide 40 MG tablet Commonly known as: LASIX Take 1 tablet (40 mg total) by mouth daily. What changed: when to take this   glimepiride 2 MG tablet Commonly known as: AMARYL Take 2 mg by mouth daily with breakfast.   insulin glargine 100 UNIT/ML injection Commonly known as: LANTUS Inject 5-6 Units into the skin at bedtime.   liothyronine 25 MCG tablet Commonly known as: CYTOMEL Take 25 mcg by mouth daily before breakfast.   metoprolol succinate 25 MG 24 hr tablet Commonly known as: TOPROL-XL Take 2 tablets (50 mg total) by mouth daily. Take with or immediately following a meal. What changed: medication strength   mirtazapine 15 MG disintegrating tablet Commonly known as: REMERON SOL-TAB Take 1 tablet (15 mg total) by mouth at bedtime.   polyethylene glycol powder 17 GM/SCOOP powder Commonly known  as: GLYCOLAX/MIRALAX Take 17 g by mouth daily.   rivaroxaban 20 MG Tabs tablet Commonly known as: XARELTO Take 1 tablet (20 mg total) by mouth daily with supper. What changed: when to take this       Contact information for after-discharge care    Destination    [facility] Preferred SNF .   Service: Skilled Nursing Contact information: [address] [place] [phone]                 Allergies  Allergen Reactions  . Amlodipine Swelling and Other (See Comments)    Legs became swollen  . Onglyza [Saxagliptin] Other (See Comments)    [facility]," [facility]  . Zyprexa [Olanzapine] Other (See Comments)    Allergy noted by ACT Team. No reaction specified (??)- "All[facility]," [facility]    Consultations:  PCCM   Procedures/Studies: CT Head Wo Contrast  Result Date: 04/02/2021 CLINICAL DATA:  Delirium EXAM: CT HEAD WITHOUT CONTRAST TECHNIQUE: Contiguous axial images were obtained from the base of the skull through the vertex without intravenous contrast. COMPARISON:  None. FINDINGS: Brain: There is no mass, hemorrhage or extra-axial collection. The  size and configuration of the ventricles and extra-axial CSF spaces are normal. The brain parenchyma is normal, without acute or chronic infarction. Vascular: No abnormal hyperdensity of the major intracranial arteries or dural venous sinuses. No intracranial atherosclerosis. Skull: The visualized skull base, calvarium and extracranial soft tissues are normal. Sinuses/Orbits: No fluid levels or advanced mucosal thickening of the visualized paranasal sinuses. No mastoid or middle ear effusion. The orbits are normal. IMPRESSION: Normal head CT. Electronically Signed   By: Deatra Robinson M.D.   On: 04/02/2021 01:43   CT CHEST ABDOMEN PELVIS W CONTRAST  Result Date: 04/02/2021 CLINICAL DATA:  Dysuria.  Sepsis. EXAM: CT CHEST, ABDOMEN, AND PELVIS WITH CONTRAST TECHNIQUE:  Multidetector CT imaging of the chest, abdomen and pelvis was performed following the standard protocol during bolus administration of intravenous contrast. CONTRAST:  70mL OMNIPAQUE IOHEXOL 300 MG/ML  SOLN COMPARISON:  08/29/2018 FINDINGS: CT CHEST FINDINGS Cardiovascular: Coronary artery and aortic atherosclerosis. Normal heart size. Normal aortic caliber. Mediastinum/Nodes: No enlarged mediastinal, hilar, or axillary lymph nodes. Thyroid gland, trachea, and esophagus demonstrate no significant findings. Lungs/Pleura: Lungs are clear. No pleural effusion or pneumothorax. Musculoskeletal: No chest wall mass or suspicious bone lesions identified. CT ABDOMEN PELVIS FINDINGS Hepatobiliary: No focal liver abnormality is seen. No gallstones, gallbladder wall thickening, or biliary dilatation. Pancreas: Unremarkable. No pancreatic ductal dilatation or surrounding inflammatory changes. Spleen: Normal in size without focal abnormality. Adrenals/Urinary Tract: Adrenal glands are unremarkable. Kidneys are normal, without renal calculi, focal lesion, or hydronephrosis. Bladder is unremarkable. Unchanged left renal sinus cyst. Stomach/Bowel: Stomach is within normal limits. Appendix appears normal. No evidence of bowel wall thickening, distention, or inflammatory changes. Vascular/Lymphatic: Aortic atherosclerosis. No enlarged abdominal or pelvic lymph nodes. Reproductive: Prostate is unremarkable. Other: No abdominal wall hernia or abnormality. No abdominopelvic ascites. Musculoskeletal: No acute or significant osseous findings. IMPRESSION: 1. No acute abnormality of the chest, abdomen or pelvis. 2. Coronary artery and aortic Atherosclerosis (ICD10-I70.0). Electronically Signed   By: Deatra Robinson M.D.   On: 04/02/2021 02:12   DG Chest Portable 1 View  Result Date: 04/02/2021 CLINICAL DATA:  Central line placement. EXAM: PORTABLE CHEST 1 VIEW COMPARISON:  04/01/2021. FINDINGS: Right IJ line noted with tip the cavoatrial  junction. Heart size normal. Low lung volumes with bibasilar atelectasis. Left base infiltrate. No pleural effusion or pneumothorax. Carotid vascular calcification. IMPRESSION: 1. Right IJ line noted with tip at cavoatrial junction. No pneumothorax. 2. Low lung volumes with bibasilar atelectasis. Left base infiltrate noted. 3.  Carotid vascular disease. Electronically Signed   By: Maisie Fus  Register   On: 04/02/2021 05:50   DG Chest Port 1 View  Result Date: 04/01/2021 CLINICAL DATA:  Altered mental status. EXAM: PORTABLE CHEST 1 VIEW COMPARISON:  Single-view of the chest 10/07/2020. FINDINGS: Lungs clear. Heart size normal. No pneumothorax or pleural fluid. No bony abnormality. IMPRESSION: Negative chest. Electronically Signed   By: Drusilla Kanner M.D.   On: 04/01/2021 19:57   ECHOCARDIOGRAM COMPLETE  Result Date: 04/03/2021    ECHOCARDIOGRAM REPORT   Patient Name:   MAKIH STEFANKO Date of Exam: 04/03/2021 Medical Rec #:  [id number]        Height:       72.0 in Accession #:    [id number]       Weight:       258.4 lb Date of Birth:  29-May-1951        BSA:          2.376 m Patient Age:  69 years         BP:           118/83 mmHg Patient Gender: M                HR:           77 bpm. Exam Location:  Inpatient Procedure: 2D Echo, Cardiac Doppler, Color Doppler and Intracardiac            Opacification Agent Indications:    I50.31 Acute diastolic (congestive) heart failure  History:        Patient has prior history of Echocardiogram examinations, most                 recent 10/08/2020.  Sonographer:    Roosvelt Maser RDCS Referring Phys: [id number] Asante Blanda  Sonographer Comments: Technically difficult study due to poor echo windows. IMPRESSIONS  1. Left ventricular ejection fraction, by estimation, is 55 to 60%. Left ventricular ejection fraction by 2D MOD biplane is 56.5 %. The left ventricle has normal function. The left ventricle has no regional wall motion abnormalities. Left ventricular diastolic parameters  are consistent with Grade I diastolic dysfunction (impaired relaxation).  2. Right ventricular systolic function is normal. The right ventricular size is normal. Tricuspid regurgitation signal is inadequate for assessing PA pressure.  3. Left atrial size was mildly dilated.  4. The mitral valve is degenerative. No evidence of mitral valve regurgitation. No evidence of mitral stenosis.  5. The aortic valve is tricuspid. There is moderate calcification of the aortic valve. There is moderate thickening of the aortic valve. Aortic valve regurgitation is not visualized. Mild aortic valve sclerosis is present, with no evidence of aortic valve stenosis. Comparison(s): No significant change from prior study. FINDINGS  Left Ventricle: Left ventricular ejection fraction, by estimation, is 55 to 60%. Left ventricular ejection fraction by 2D MOD biplane is 56.5 %. The left ventricle has normal function. The left ventricle has no regional wall motion abnormalities. Definity contrast agent was given IV to delineate the left ventricular endocardial borders. The left ventricular internal cavity size was normal in size. There is no left ventricular hypertrophy. Left ventricular diastolic parameters are consistent with Grade I diastolic dysfunction (impaired relaxation). Right Ventricle: The right ventricular size is normal. No increase in right ventricular wall thickness. Right ventricular systolic function is normal. Tricuspid regurgitation signal is inadequate for assessing PA pressure. Left Atrium: Left atrial size was mildly dilated. Right Atrium: Right atrial size was normal in size. Pericardium: Trivial pericardial effusion is present. Presence of pericardial fat pad. Mitral Valve: The mitral valve is degenerative in appearance. Mild mitral annular calcification. No evidence of mitral valve regurgitation. No evidence of mitral valve stenosis. Tricuspid Valve: The tricuspid valve is grossly normal. Tricuspid valve regurgitation  is not demonstrated. No evidence of tricuspid stenosis. Aortic Valve: The aortic valve is tricuspid. There is moderate calcification of the aortic valve. There is moderate thickening of the aortic valve. Aortic valve regurgitation is not visualized. Mild aortic valve sclerosis is present, with no evidence of aortic valve stenosis. Aortic valve mean gradient measures 5.0 mmHg. Aortic valve peak gradient measures 8.3 mmHg. Aortic valve area, by VTI measures 2.33 cm. Pulmonic Valve: The pulmonic valve was not well visualized. Pulmonic valve regurgitation is not visualized. Aorta: The aortic root is normal in size and structure. Venous: The inferior vena cava was not well visualized. IAS/Shunts: The atrial septum is grossly normal.  LEFT VENTRICLE PLAX 2D  Biplane EF (MOD) LVOT diam:     2.30 cm         LV Biplane EF:   Left LV SV:         58                               ventricular LV SV Index:   24                               ejection LVOT Area:     4.15 cm                         fraction by                                                 2D MOD                                                 biplane is LV Volumes (MOD)                                56.5 %. LV vol d, MOD    59.2 ml A2C:                           Diastology LV vol d, MOD    53.4 ml       LV e' medial:    4.79 cm/s A4C:                           LV E/e' medial:  13.5 LV vol s, MOD    26.4 ml       LV e' lateral:   7.62 cm/s A2C:                           LV E/e' lateral: 8.5 LV vol s, MOD    22.8 ml A4C: LV SV MOD A2C:   32.8 ml LV SV MOD A4C:   53.4 ml LV SV MOD BP:    32.0 ml RIGHT VENTRICLE RV Basal diam:  3.46 cm RV S prime:     10.70 cm/s TAPSE (M-mode): 1.4 cm LEFT ATRIUM             Index       RIGHT ATRIUM           Index LA Vol (A2C):   99.5 ml 41.88 ml/m RA Area:     20.10 cm LA Vol (A4C):   94.4 ml 39.74 ml/m RA Volume:   56.10 ml  23.62 ml/m LA Biplane Vol: 99.7 ml 41.97 ml/m  AORTIC VALVE AV Area (Vmax):     2.13 cm AV Area (Vmean):   1.93 cm AV Area (VTI):     2.33 cm AV Vmax:           144.00 cm/s AV Vmean:  105.000 cm/s AV VTI:            0.248 m AV Peak Grad:      8.3 mmHg AV Mean Grad:      5.0 mmHg LVOT Vmax:         73.90 cm/s LVOT Vmean:        48.800 cm/s LVOT VTI:          0.139 m LVOT/AV VTI ratio: 0.56 MITRAL VALVE MV Area (PHT): 2.91 cm    SHUNTS MV Decel Time: 261 msec    Systemic VTI:  0.14 m MV E velocity: 64.50 cm/s  Systemic Diam: 2.30 cm MV A velocity: 89.70 cm/s MV E/A ratio:  0.72 Lennie Odor MD Electronically signed by Lennie Odor MD Signature Date/Time: 04/03/2021/3:48:05 PM    Final       Subjective: Patient seen and examined at the bedside this morning.  Hemodynamically stable for discharge today.  I discussed with daughter on phone today about his management plan.  Discharge Exam: Vitals:   04/06/21 0417 04/06/21 1407  BP: 109/80 (!) 136/91  Pulse: 79 100  Resp: 20 18  Temp: 98.2 F (36.8 C) 98.4 F (36.9 C)  SpO2: 98% 96%   Vitals:   04/05/21 2154 04/06/21 0417 04/06/21 0425 04/06/21 1407  BP: 117/79 109/80  (!) 136/91  Pulse: 83 79  100  Resp: 20 20  18   Temp: 99.3 F (37.4 C) 98.2 F (36.8 C)  98.4 F (36.9 C)  TempSrc: Oral Oral  Oral  SpO2: 94% 98%  96%  Weight:   110.7 kg   Height:        General: Pt is alert, awake, not in acute distress Cardiovascular: RRR, S1/S2 +, no rubs, no gallops Respiratory: CTA bilaterally, no wheezing, no rhonchi Abdominal: Soft, NT, ND, bowel sounds + Extremities: no edema, no cyanosis    The results of significant diagnostics from this hospitalization (including imaging, microbiology, ancillary and laboratory) are listed below for reference.     Microbiology: Recent Results (from the past 240 hour(s))  Culture, blood (Routine x 2)     Status: None (Preliminary result)   Collection Time: 04/01/21  7:47 PM   Specimen: BLOOD RIGHT FOREARM  Result Value Ref Range Status   Specimen Description    Final    BLOOD RIGHT FOREARM Performed at [facility], [address], [place], [place]    Special Requests   Final    BOTTLES DRAWN AEROBIC AND ANAEROBIC Blood Culture results may not be optimal due to an inadequate volume of blood received in culture bottles Performed at [facility], [address] [place], [place]    Culture   Final    NO GROWTH 4 DAYS Performed at [facility], [address] [place], [place]    Report Status PENDING  Incomplete  Culture, blood (Routine x 2)     Status: None (Preliminary result)   Collection Time: 04/01/21  8:29 PM   Specimen: BLOOD LEFT WRIST  Result Value Ref Range Status   Specimen Description   Final    BLOOD LEFT WRIST Performed at [facility], [address] [place], [place]    Special Requests   Final    BOTTLES DRAWN AEROBIC AND ANAEROBIC Blood Culture results may not be optimal due to an inadequate volume of blood received in culture bottles Performed at [facility], [address] [place], [place] [address]    Culture   Final    NO GROWTH 4 DAYS Performed at [facility], [address], [place], [place] [address]    Report Status PENDING  Incomplete  Resp Panel by RT-PCR (Flu A&B, Covid) Nasopharyngeal Swab     Status: None   Collection Time: 04/01/21  8:29 PM   Specimen: Nasopharyngeal Swab; Nasopharyngeal(NP) swabs in vial transport medium  Result Value Ref Range Status   SARS Coronavirus 2 by RT PCR NEGATIVE NEGATIVE Final    Comment: (NOTE) SARS-CoV-2 target nucleic acids are NOT DETECTED.  The SARS-CoV-2 RNA is generally detectable in upper respiratory specimens during the acute phase of infection. The lowest concentration of SARS-CoV-2 viral copies this assay can detect is 138 copies/mL. A negative result does not preclude SARS-Cov-2 infection and should not be used as the sole basis for  treatment or other patient management decisions. A negative result may occur with  improper specimen collection/handling, submission of specimen other than nasopharyngeal swab, presence of viral mutation(s) within the areas targeted by this assay, and inadequate number of viral copies(<138 copies/mL). A negative result must be combined with clinical observations, patient history, and epidemiological information. The expected result is Negative.  Fact Sheet for Patients:  BloggerCourse.com  Fact Sheet for Healthcare Providers:  SeriousBroker.it  This test is no t yet approved or cleared by the [place] FDA and  has been authorized for detection and/or diagnosis of SARS-CoV-2 by FDA under an Emergency Use Authorization (EUA). This EUA will remain  in effect (meaning this test can be used) for the duration of the COVID-19 declaration under Section 564(b)(1) of the Act, 21 U.S.C.section 360bbb-3(b)(1), unless the authorization is terminated  or revoked sooner.       Influenza A by PCR NEGATIVE NEGATIVE Final   Influenza B by PCR NEGATIVE NEGATIVE Final    Comment: (NOTE) The Xpert Xpress SARS-CoV-2/FLU/RSV plus assay is intended as an aid in the diagnosis of influenza from Nasopharyngeal swab specimens and should not be used as a sole basis for treatment. Nasal washings and aspirates are unacceptable for Xpert Xpress SARS-CoV-2/FLU/RSV testing.  Fact Sheet for Patients: BloggerCourse.com  Fact Sheet for Healthcare Providers: SeriousBroker.it  This test is not yet approved or cleared by the [place] FDA and has been authorized for detection and/or diagnosis of SARS-CoV-2 by FDA under an Emergency Use Authorization (EUA). This EUA will remain in effect (meaning this test can be used) for the duration of the COVID-19 declaration under Section 564(b)(1) of the Act, 21  U.S.C. section 360bbb-3(b)(1), unless the authorization is terminated or revoked.  Performed at [facility], [address], [place], [place] [address]   MRSA PCR Screening     Status: None   Collection Time: 04/02/21  7:48 AM   Specimen: Nasal Mucosa; Nasopharyngeal  Result Value Ref Range Status   MRSA by PCR NEGATIVE NEGATIVE Final    Comment:        The GeneXpert MRSA Assay (FDA approved for NASAL specimens only), is one component of a comprehensive MRSA colonization surveillance program. It is not intended to diagnose MRSA infection nor to guide or monitor treatment for MRSA infections. Performed at [facility], [address], [place], [place] [address]   Resp Panel by RT-PCR (Flu A&B, Covid) Nasopharyngeal Swab     Status: None   Collection Time: 04/06/21 11:20 AM   Specimen: Nasopharyngeal Swab; Nasopharyngeal(NP) swabs in vial transport medium  Result Value Ref Range Status   SARS Coronavirus 2 by RT PCR NEGATIVE NEGATIVE Final    Comment: (NOTE) SARS-CoV-2 target nucleic acids are NOT DETECTED.  The SARS-CoV-2 RNA is generally detectable in upper respiratory specimens during the acute phase of infection. The lowest concentration of SARS-CoV-2 viral copies this assay can detect is 138 copies/mL. A negative result does not preclude SARS-Cov-2 infection and should not be used as the sole basis for treatment or other patient management decisions. A negative result may occur with  improper specimen collection/handling, submission of specimen other than nasopharyngeal swab, presence of viral mutation(s) within the areas targeted by this assay, and inadequate number of viral copies(<138 copies/mL). A negative result must be combined with clinical observations, patient history, and epidemiological information. The expected result is Negative.  Fact Sheet for Patients:  BloggerCourse.com  Fact Sheet for  Healthcare Providers:  SeriousBroker.it  This test is no t yet approved or cleared by the [place] FDA and  has been authorized for detection and/or diagnosis of SARS-CoV-2 by FDA under an Emergency Use Authorization (EUA). This EUA will remain  in effect (meaning this test can be used) for the duration of the COVID-19 declaration under Section 564(b)(1) of the Act, 21 U.S.C.section 360bbb-3(b)(1), unless the authorization is terminated  or revoked sooner.       Influenza A by PCR NEGATIVE NEGATIVE Final   Influenza B by PCR NEGATIVE NEGATIVE Final    Comment: (NOTE) The Xpert Xpress SARS-CoV-2/FLU/RSV plus assay is intended as an aid in the diagnosis of influenza from Nasopharyngeal swab specimens and should not be used as a sole basis for treatment. Nasal washings and aspirates are unacceptable for Xpert Xpress SARS-CoV-2/FLU/RSV testing.  Fact Sheet for Patients: BloggerCourse.com  Fact Sheet for Healthcare Providers: SeriousBroker.it  This test is not yet approved or cleared by the [place] FDA and has been authorized for detection and/or diagnosis of SARS-CoV-2 by FDA under an Emergency Use Authorization (EUA). This EUA will remain in effect (meaning this test can be used) for the duration of the COVID-19 declaration under Section 564(b)(1) of the Act, 21 U.S.C. section 360bbb-3(b)(1), unless the authorization is terminated or revoked.  Performed at [facility], [address], [place], [place] [address]      Labs: BNP (last 3 results) Recent Labs    04/02/21 0455  BNP 455.5*   Basic Metabolic Panel: Recent Labs  Lab 04/01/21 1947 04/02/21 0455 04/03/21 0246 04/04/21 0457 04/05/21 0503  NA 131* 138 139 132* 135  K 5.3* 4.7 4.5 4.1 3.9  CL 100 107 109 97* 100  CO2 20* 22 20* 23 25  GLUCOSE 206* 109* 108* 124* 121*  BUN 38* 35* 37* 39* 40*   CREATININE 2.13* 1.88* 1.71* 1.66* 1.73*  CALCIUM 9.1 8.7* 9.4 9.1 9.2  MG  --  2.0 2.0  --   --   PHOS  --   --  2.4* 4.1  --    Liver Function Tests: Recent Labs  Lab 04/01/21 1947 04/03/21 0246  AST 36 23  ALT 38 29  ALKPHOS 67 55  BILITOT 0.7 0.1*  PROT 7.5 6.7  ALBUMIN 3.5 3.1*   No results for input(s): LIPASE, AMYLASE in the last 168 hours. No results for input(s): AMMONIA in the last 168 hours. CBC: Recent Labs  Lab 04/01/21 1947 04/02/21 0455 04/03/21 0246 04/04/21 0457  WBC 16.6* 14.7* 9.6 5.6  NEUTROABS 14.9*  --  8.2* 3.8  HGB  14.7 12.7* 13.1 13.6  HCT 49.5 41.4 43.3 43.6  MCV 84.5 83.1 83.8 80.7  PLT 209 181 178 179   Cardiac Enzymes: No results for input(s): CKTOTAL, CKMB, CKMBINDEX, TROPONINI in the last 168 hours. BNP: Invalid input(s): POCBNP CBG: Recent Labs  Lab 04/05/21 2015 04/05/21 2357 04/06/21 0417 04/06/21 0733 04/06/21 1231  GLUCAP 180* 155* 131* 105* 188*   D-Dimer No results for input(s): DDIMER in the last 72 hours. Hgb A1c No results for input(s): HGBA1C in the last 72 hours. Lipid Profile No results for input(s): CHOL, HDL, LDLCALC, TRIG, CHOLHDL, LDLDIRECT in the last 72 hours. Thyroid function studies No results for input(s): TSH, T4TOTAL, T3FREE, THYROIDAB in the last 72 hours.  Invalid input(s): FREET3 Anemia work up No results for input(s): VITAMINB12, FOLATE, FERRITIN, TIBC, IRON, RETICCTPCT in the last 72 hours. Urinalysis    Component Value Date/Time   COLORURINE YELLOW 04/01/2021 2200   APPEARANCEUR CLEAR 04/01/2021 2200   APPEARANCEUR Cloudy 08/01/2014 1614   LABSPEC 1.013 04/01/2021 2200   LABSPEC 1.008 08/01/2014 1614   PHURINE 7.0 04/01/2021 2200   GLUCOSEU >=500 (A) 04/01/2021 2200   GLUCOSEU Negative 08/01/2014 1614   HGBUR NEGATIVE 04/01/2021 2200   BILIRUBINUR NEGATIVE 04/01/2021 2200   BILIRUBINUR Negative 08/01/2014 1614   KETONESUR NEGATIVE 04/01/2021 2200   PROTEINUR NEGATIVE 04/01/2021  2200   UROBILINOGEN 0.2 07/01/2014 1809   NITRITE NEGATIVE 04/01/2021 2200   LEUKOCYTESUR NEGATIVE 04/01/2021 2200   LEUKOCYTESUR Negative 08/01/2014 1614   Sepsis Labs Invalid input(s): PROCALCITONIN,  WBC,  LACTICIDVEN Microbiology Recent Results (from the past 240 hour(s))  Culture, blood (Routine x 2)     Status: None (Preliminary result)   Collection Time: 04/01/21  7:47 PM   Specimen: BLOOD RIGHT FOREARM  Result Value Ref Range Status   Specimen Description   Final    BLOOD RIGHT FOREARM Performed at [facility], [address], [place], [place] [address]    Special Requests   Final    BOTTLES DRAWN AEROBIC AND ANAEROBIC Blood Culture results may not be optimal due to an inadequate volume of blood received in culture bottles Performed at [facility], [address], [place], [place] [address]    Culture   Final    NO GROWTH 4 DAYS Performed at [facility], [address], [place], [place] [address]    Report Status PENDING  Incomplete  Culture, blood (Routine x 2)     Status: None (Preliminary result)   Collection Time: 04/01/21  8:29 PM   Specimen: BLOOD LEFT WRIST  Result Value Ref Range Status   Specimen Description   Final    BLOOD LEFT WRIST Performed at [facility], [address] [place], [place] [address]    Special Requests   Final    BOTTLES DRAWN AEROBIC AND ANAEROBIC Blood Culture results may not be optimal due to an inadequate volume of blood received in culture bottles Performed at [facility], [address] [place], [place] [address]    Culture   Final    NO GROWTH 4 DAYS Performed at [facility], [address] [place], [place] [address]    Report Status PENDING  Incomplete  Resp Panel by RT-PCR (Flu A&B, Covid) Nasopharyngeal Swab     Status: None   Collection Time: 04/01/21  8:29 PM   Specimen: Nasopharyngeal Swab; Nasopharyngeal(NP) swabs in vial  transport medium  Result Value Ref Range  Status   SARS Coronavirus 2 by RT PCR NEGATIVE NEGATIVE Final    Comment: (NOTE) SARS-CoV-2 target nucleic acids are NOT DETECTED.  The SARS-CoV-2 RNA is generally detectable in upper respiratory specimens during the acute phase of infection. The lowest concentration of SARS-CoV-2 viral copies this assay can detect is 138 copies/mL. A negative result does not preclude SARS-Cov-2 infection and should not be used as the sole basis for treatment or other patient management decisions. A negative result may occur with  improper specimen collection/handling, submission of specimen other than nasopharyngeal swab, presence of viral mutation(s) within the areas targeted by this assay, and inadequate number of viral copies(<138 copies/mL). A negative result must be combined with clinical observations, patient history, and epidemiological information. The expected result is Negative.  Fact Sheet for Patients:  BloggerCourse.com  Fact Sheet for Healthcare Providers:  SeriousBroker.it  This test is no t yet approved or cleared by the [place] FDA and  has been authorized for detection and/or diagnosis of SARS-CoV-2 by FDA under an Emergency Use Authorization (EUA). This EUA will remain  in effect (meaning this test can be used) for the duration of the COVID-19 declaration under Section 564(b)(1) of the Act, 21 U.S.C.section 360bbb-3(b)(1), unless the authorization is terminated  or revoked sooner.       Influenza A by PCR NEGATIVE NEGATIVE Final   Influenza B by PCR NEGATIVE NEGATIVE Final    Comment: (NOTE) The Xpert Xpress SARS-CoV-2/FLU/RSV plus assay is intended as an aid in the diagnosis of influenza from Nasopharyngeal swab specimens and should not be used as a sole basis for treatment. Nasal washings and aspirates are unacceptable for Xpert Xpress SARS-CoV-2/FLU/RSV testing.  Fact  Sheet for Patients: BloggerCourse.com  Fact Sheet for Healthcare Providers: SeriousBroker.it  This test is not yet approved or cleared by the [place] FDA and has been authorized for detection and/or diagnosis of SARS-CoV-2 by FDA under an Emergency Use Authorization (EUA). This EUA will remain in effect (meaning this test can be used) for the duration of the COVID-19 declaration under Section 564(b)(1) of the Act, 21 U.S.C. section 360bbb-3(b)(1), unless the authorization is terminated or revoked.  Performed at [facility], [address], [place], [place] [address]   MRSA PCR Screening     Status: None   Collection Time: 04/02/21  7:48 AM   Specimen: Nasal Mucosa; Nasopharyngeal  Result Value Ref Range Status   MRSA by PCR NEGATIVE NEGATIVE Final    Comment:        The GeneXpert MRSA Assay (FDA approved for NASAL specimens only), is one component of a comprehensive MRSA colonization surveillance program. It is not intended to diagnose MRSA infection nor to guide or monitor treatment for MRSA infections. Performed at [facility], [address] [place], [place] [address]   Resp Panel by RT-PCR (Flu A&B, Covid) Nasopharyngeal Swab     Status: None   Collection Time: 04/06/21 11:20 AM   Specimen: Nasopharyngeal Swab; Nasopharyngeal(NP) swabs in vial transport medium  Result Value Ref Range Status   SARS Coronavirus 2 by RT PCR NEGATIVE NEGATIVE Final    Comment: (NOTE) SARS-CoV-2 target nucleic acids are NOT DETECTED.  The SARS-CoV-2 RNA is generally detectable in upper respiratory specimens during the acute phase of infection. The lowest concentration of SARS-CoV-2 viral copies this assay can detect is 138 copies/mL. A negative result does not preclude SARS-Cov-2 infection and should not be used as the sole basis for treatment or  other patient management decisions. A negative  result may occur with  improper specimen collection/handling, submission of specimen other than nasopharyngeal swab, presence of viral mutation(s) within the areas targeted by this assay, and inadequate number of viral copies(<138 copies/mL). A negative result must be combined with clinical observations, patient history, and epidemiological information. The expected result is Negative.  Fact Sheet for Patients:  BloggerCourse.com  Fact Sheet for Healthcare Providers:  SeriousBroker.it  This test is no t yet approved or cleared by the [place] FDA and  has been authorized for detection and/or diagnosis of SARS-CoV-2 by FDA under an Emergency Use Authorization (EUA). This EUA will remain  in effect (meaning this test can be used) for the duration of the COVID-19 declaration under Section 564(b)(1) of the Act, 21 U.S.C.section 360bbb-3(b)(1), unless the authorization is terminated  or revoked sooner.       Influenza A by PCR NEGATIVE NEGATIVE Final   Influenza B by PCR NEGATIVE NEGATIVE Final    Comment: (NOTE) The Xpert Xpress SARS-CoV-2/FLU/RSV plus assay is intended as an aid in the diagnosis of influenza from Nasopharyngeal swab specimens and should not be used as a sole basis for treatment. Nasal washings and aspirates are unacceptable for Xpert Xpress SARS-CoV-2/FLU/RSV testing.  Fact Sheet for Patients: BloggerCourse.com  Fact Sheet for Healthcare Providers: SeriousBroker.it  This test is not yet approved or cleared by the [place] FDA and has been authorized for detection and/or diagnosis of SARS-CoV-2 by FDA under an Emergency Use Authorization (EUA). This EUA will remain in effect (meaning this test can be used) for the duration of the COVID-19 declaration under Section 564(b)(1) of the Act, 21 U.S.C. section 360bbb-3(b)(1), unless the authorization is  terminated or revoked.  Performed at [facility], [address], [place], [place] [address]     Please note: You were cared for by a hospitalist during your hospital stay. Once you are discharged, your primary care physician will handle any further medical issues. Please note that NO REFILLS for any discharge medications will be authorized once you are discharged, as it is imperative that you return to your primary care physician (or establish a relationship with a primary care physician if you do not have one) for your post hospital discharge needs so that they can reassess your need for medications and monitor your lab values.    Time coordinating discharge: 40 minutes  SIGNED:   Burnadette Pop, MD  [facility] 04/06/2021, 3:02 PM Pager [phone]  If 7PM-7AM, please contact night-coverage [email or web address] Password [email or web address]

## 2021-04-06 NOTE — TOC Progression Note (Addendum)
Transition of Care [facility]) - Progression Note    Patient Details  Name: KEMONI ORTEGA MRN: [id number] Date of Birth: 05/05/1951  Transition of Care [facility]) CM/SW Contact  Alverto Shedd, Olegario Messier, RN Phone Number: 04/06/2021, 12:22 PM  Clinical Narrative: Info for pasrr re submitted for completion-await pasrr. Bed offers given.Covid ordered. Await choice,then start auth.   1:18p-auth initiated [facility] [id number] auth.Received pasrr. 3p-auth received-auth [id number]  Through 5/11. [facility] can accept today going to rm#[id number], nsg call report tel# [phone]. [facility] to be called once d/c summary sent. No further CM needs.   Expected Discharge Plan: Skilled Nursing Facility Barriers to Discharge: Awaiting State Approval (PASRR),Insurance Authorization  Expected Discharge Plan and Services Expected Discharge Plan: Skilled Nursing Facility In-house Referral: Clinical Social Work     Living arrangements for the past 2 months: Apartment                                       Social Determinants of Health (SDOH) Interventions    Readmission Risk Interventions No flowsheet data found.

## 2021-04-06 NOTE — TOC Transition Note (Signed)
Transition of Care (TOC) -30 day Note       Patient Details   Name:Christian [name]   VZS:[id number]  Date of Birth:[birth year]     Transition of Care [facility]) CM/SW Contact   Name:Mikah Rottinghaus [facility]  Phone Number:[phone]  Date:04/06/2021  Time:12p     MUST [id number]     To Whom it May Concern:     Please be advised that the above patient will require a short-term nursing home stay, anticipated 30 days or less rehabilitation and strengthening. The plan is for return home.

## 2021-04-07 LAB — CULTURE, BLOOD (ROUTINE X 2)
Culture: NO GROWTH
Culture: NO GROWTH

## 2021-04-09 DIAGNOSIS — E119 Type 2 diabetes mellitus without complications: Secondary | ICD-10-CM | POA: Diagnosis not present

## 2021-04-09 DIAGNOSIS — N179 Acute kidney failure, unspecified: Secondary | ICD-10-CM | POA: Diagnosis not present

## 2021-04-09 DIAGNOSIS — I480 Paroxysmal atrial fibrillation: Secondary | ICD-10-CM | POA: Diagnosis not present

## 2021-04-09 DIAGNOSIS — I5033 Acute on chronic diastolic (congestive) heart failure: Secondary | ICD-10-CM | POA: Diagnosis not present

## 2021-04-15 DIAGNOSIS — F319 Bipolar disorder, unspecified: Secondary | ICD-10-CM | POA: Diagnosis not present

## 2021-04-15 DIAGNOSIS — R5381 Other malaise: Secondary | ICD-10-CM | POA: Diagnosis not present

## 2021-04-15 DIAGNOSIS — M6281 Muscle weakness (generalized): Secondary | ICD-10-CM | POA: Diagnosis not present

## 2021-04-20 DIAGNOSIS — N1832 Chronic kidney disease, stage 3b: Secondary | ICD-10-CM | POA: Diagnosis not present

## 2021-04-20 DIAGNOSIS — I5033 Acute on chronic diastolic (congestive) heart failure: Secondary | ICD-10-CM | POA: Diagnosis not present

## 2021-04-20 DIAGNOSIS — R5381 Other malaise: Secondary | ICD-10-CM | POA: Diagnosis not present

## 2021-04-20 DIAGNOSIS — R262 Difficulty in walking, not elsewhere classified: Secondary | ICD-10-CM | POA: Diagnosis not present

## 2021-04-20 DIAGNOSIS — I10 Essential (primary) hypertension: Secondary | ICD-10-CM | POA: Diagnosis not present

## 2021-04-20 DIAGNOSIS — M6281 Muscle weakness (generalized): Secondary | ICD-10-CM | POA: Diagnosis not present

## 2021-04-20 DIAGNOSIS — I480 Paroxysmal atrial fibrillation: Secondary | ICD-10-CM | POA: Diagnosis not present

## 2021-04-20 DIAGNOSIS — F319 Bipolar disorder, unspecified: Secondary | ICD-10-CM | POA: Diagnosis not present

## 2021-04-24 DIAGNOSIS — A419 Sepsis, unspecified organism: Secondary | ICD-10-CM | POA: Diagnosis not present

## 2021-04-24 DIAGNOSIS — F3131 Bipolar disorder, current episode depressed, mild: Secondary | ICD-10-CM | POA: Diagnosis not present

## 2021-04-24 DIAGNOSIS — N1832 Chronic kidney disease, stage 3b: Secondary | ICD-10-CM | POA: Diagnosis not present

## 2021-04-24 DIAGNOSIS — I2510 Atherosclerotic heart disease of native coronary artery without angina pectoris: Secondary | ICD-10-CM | POA: Diagnosis not present

## 2021-04-24 DIAGNOSIS — I480 Paroxysmal atrial fibrillation: Secondary | ICD-10-CM | POA: Diagnosis not present

## 2021-04-24 DIAGNOSIS — E1122 Type 2 diabetes mellitus with diabetic chronic kidney disease: Secondary | ICD-10-CM | POA: Diagnosis not present

## 2021-04-24 DIAGNOSIS — R32 Unspecified urinary incontinence: Secondary | ICD-10-CM | POA: Diagnosis not present

## 2021-04-24 DIAGNOSIS — I130 Hypertensive heart and chronic kidney disease with heart failure and stage 1 through stage 4 chronic kidney disease, or unspecified chronic kidney disease: Secondary | ICD-10-CM | POA: Diagnosis not present

## 2021-04-24 DIAGNOSIS — I5033 Acute on chronic diastolic (congestive) heart failure: Secondary | ICD-10-CM | POA: Diagnosis not present

## 2021-04-29 DIAGNOSIS — I5033 Acute on chronic diastolic (congestive) heart failure: Secondary | ICD-10-CM | POA: Diagnosis not present

## 2021-04-29 DIAGNOSIS — R32 Unspecified urinary incontinence: Secondary | ICD-10-CM | POA: Diagnosis not present

## 2021-04-29 DIAGNOSIS — N1832 Chronic kidney disease, stage 3b: Secondary | ICD-10-CM | POA: Diagnosis not present

## 2021-04-29 DIAGNOSIS — F3131 Bipolar disorder, current episode depressed, mild: Secondary | ICD-10-CM | POA: Diagnosis not present

## 2021-04-29 DIAGNOSIS — I130 Hypertensive heart and chronic kidney disease with heart failure and stage 1 through stage 4 chronic kidney disease, or unspecified chronic kidney disease: Secondary | ICD-10-CM | POA: Diagnosis not present

## 2021-04-29 DIAGNOSIS — E1122 Type 2 diabetes mellitus with diabetic chronic kidney disease: Secondary | ICD-10-CM | POA: Diagnosis not present

## 2021-04-29 DIAGNOSIS — A419 Sepsis, unspecified organism: Secondary | ICD-10-CM | POA: Diagnosis not present

## 2021-04-29 DIAGNOSIS — I2510 Atherosclerotic heart disease of native coronary artery without angina pectoris: Secondary | ICD-10-CM | POA: Diagnosis not present

## 2021-04-29 DIAGNOSIS — I480 Paroxysmal atrial fibrillation: Secondary | ICD-10-CM | POA: Diagnosis not present

## 2021-04-30 DIAGNOSIS — N1832 Chronic kidney disease, stage 3b: Secondary | ICD-10-CM | POA: Diagnosis not present

## 2021-04-30 DIAGNOSIS — I480 Paroxysmal atrial fibrillation: Secondary | ICD-10-CM | POA: Diagnosis not present

## 2021-04-30 DIAGNOSIS — I5033 Acute on chronic diastolic (congestive) heart failure: Secondary | ICD-10-CM | POA: Diagnosis not present

## 2021-04-30 DIAGNOSIS — E1122 Type 2 diabetes mellitus with diabetic chronic kidney disease: Secondary | ICD-10-CM | POA: Diagnosis not present

## 2021-04-30 DIAGNOSIS — A419 Sepsis, unspecified organism: Secondary | ICD-10-CM | POA: Diagnosis not present

## 2021-04-30 DIAGNOSIS — I2510 Atherosclerotic heart disease of native coronary artery without angina pectoris: Secondary | ICD-10-CM | POA: Diagnosis not present

## 2021-04-30 DIAGNOSIS — F3131 Bipolar disorder, current episode depressed, mild: Secondary | ICD-10-CM | POA: Diagnosis not present

## 2021-04-30 DIAGNOSIS — R32 Unspecified urinary incontinence: Secondary | ICD-10-CM | POA: Diagnosis not present

## 2021-04-30 DIAGNOSIS — I130 Hypertensive heart and chronic kidney disease with heart failure and stage 1 through stage 4 chronic kidney disease, or unspecified chronic kidney disease: Secondary | ICD-10-CM | POA: Diagnosis not present

## 2021-05-05 DIAGNOSIS — N1832 Chronic kidney disease, stage 3b: Secondary | ICD-10-CM | POA: Diagnosis not present

## 2021-05-05 DIAGNOSIS — R32 Unspecified urinary incontinence: Secondary | ICD-10-CM | POA: Diagnosis not present

## 2021-05-05 DIAGNOSIS — F3131 Bipolar disorder, current episode depressed, mild: Secondary | ICD-10-CM | POA: Diagnosis not present

## 2021-05-05 DIAGNOSIS — E1122 Type 2 diabetes mellitus with diabetic chronic kidney disease: Secondary | ICD-10-CM | POA: Diagnosis not present

## 2021-05-05 DIAGNOSIS — A419 Sepsis, unspecified organism: Secondary | ICD-10-CM | POA: Diagnosis not present

## 2021-05-05 DIAGNOSIS — I2510 Atherosclerotic heart disease of native coronary artery without angina pectoris: Secondary | ICD-10-CM | POA: Diagnosis not present

## 2021-05-05 DIAGNOSIS — I480 Paroxysmal atrial fibrillation: Secondary | ICD-10-CM | POA: Diagnosis not present

## 2021-05-05 DIAGNOSIS — I130 Hypertensive heart and chronic kidney disease with heart failure and stage 1 through stage 4 chronic kidney disease, or unspecified chronic kidney disease: Secondary | ICD-10-CM | POA: Diagnosis not present

## 2021-05-05 DIAGNOSIS — I5033 Acute on chronic diastolic (congestive) heart failure: Secondary | ICD-10-CM | POA: Diagnosis not present

## 2021-05-07 DIAGNOSIS — F3131 Bipolar disorder, current episode depressed, mild: Secondary | ICD-10-CM | POA: Diagnosis not present

## 2021-05-07 DIAGNOSIS — A419 Sepsis, unspecified organism: Secondary | ICD-10-CM | POA: Diagnosis not present

## 2021-05-07 DIAGNOSIS — E1122 Type 2 diabetes mellitus with diabetic chronic kidney disease: Secondary | ICD-10-CM | POA: Diagnosis not present

## 2021-05-07 DIAGNOSIS — I2510 Atherosclerotic heart disease of native coronary artery without angina pectoris: Secondary | ICD-10-CM | POA: Diagnosis not present

## 2021-05-07 DIAGNOSIS — N1832 Chronic kidney disease, stage 3b: Secondary | ICD-10-CM | POA: Diagnosis not present

## 2021-05-07 DIAGNOSIS — I5033 Acute on chronic diastolic (congestive) heart failure: Secondary | ICD-10-CM | POA: Diagnosis not present

## 2021-05-07 DIAGNOSIS — I130 Hypertensive heart and chronic kidney disease with heart failure and stage 1 through stage 4 chronic kidney disease, or unspecified chronic kidney disease: Secondary | ICD-10-CM | POA: Diagnosis not present

## 2021-05-07 DIAGNOSIS — R32 Unspecified urinary incontinence: Secondary | ICD-10-CM | POA: Diagnosis not present

## 2021-05-07 DIAGNOSIS — I480 Paroxysmal atrial fibrillation: Secondary | ICD-10-CM | POA: Diagnosis not present

## 2021-05-11 DIAGNOSIS — I5033 Acute on chronic diastolic (congestive) heart failure: Secondary | ICD-10-CM | POA: Diagnosis not present

## 2021-05-11 DIAGNOSIS — R32 Unspecified urinary incontinence: Secondary | ICD-10-CM | POA: Diagnosis not present

## 2021-05-11 DIAGNOSIS — I130 Hypertensive heart and chronic kidney disease with heart failure and stage 1 through stage 4 chronic kidney disease, or unspecified chronic kidney disease: Secondary | ICD-10-CM | POA: Diagnosis not present

## 2021-05-11 DIAGNOSIS — I2510 Atherosclerotic heart disease of native coronary artery without angina pectoris: Secondary | ICD-10-CM | POA: Diagnosis not present

## 2021-05-11 DIAGNOSIS — F3131 Bipolar disorder, current episode depressed, mild: Secondary | ICD-10-CM | POA: Diagnosis not present

## 2021-05-11 DIAGNOSIS — N1832 Chronic kidney disease, stage 3b: Secondary | ICD-10-CM | POA: Diagnosis not present

## 2021-05-11 DIAGNOSIS — I480 Paroxysmal atrial fibrillation: Secondary | ICD-10-CM | POA: Diagnosis not present

## 2021-05-11 DIAGNOSIS — A419 Sepsis, unspecified organism: Secondary | ICD-10-CM | POA: Diagnosis not present

## 2021-05-11 DIAGNOSIS — E1122 Type 2 diabetes mellitus with diabetic chronic kidney disease: Secondary | ICD-10-CM | POA: Diagnosis not present

## 2021-05-12 DIAGNOSIS — I480 Paroxysmal atrial fibrillation: Secondary | ICD-10-CM | POA: Diagnosis not present

## 2021-05-12 DIAGNOSIS — N1832 Chronic kidney disease, stage 3b: Secondary | ICD-10-CM | POA: Diagnosis not present

## 2021-05-12 DIAGNOSIS — I130 Hypertensive heart and chronic kidney disease with heart failure and stage 1 through stage 4 chronic kidney disease, or unspecified chronic kidney disease: Secondary | ICD-10-CM | POA: Diagnosis not present

## 2021-05-12 DIAGNOSIS — E1122 Type 2 diabetes mellitus with diabetic chronic kidney disease: Secondary | ICD-10-CM | POA: Diagnosis not present

## 2021-05-12 DIAGNOSIS — I5033 Acute on chronic diastolic (congestive) heart failure: Secondary | ICD-10-CM | POA: Diagnosis not present

## 2021-05-12 DIAGNOSIS — I2510 Atherosclerotic heart disease of native coronary artery without angina pectoris: Secondary | ICD-10-CM | POA: Diagnosis not present

## 2021-05-12 DIAGNOSIS — F3131 Bipolar disorder, current episode depressed, mild: Secondary | ICD-10-CM | POA: Diagnosis not present

## 2021-05-12 DIAGNOSIS — A419 Sepsis, unspecified organism: Secondary | ICD-10-CM | POA: Diagnosis not present

## 2021-05-12 DIAGNOSIS — R32 Unspecified urinary incontinence: Secondary | ICD-10-CM | POA: Diagnosis not present

## 2021-05-14 DIAGNOSIS — N1832 Chronic kidney disease, stage 3b: Secondary | ICD-10-CM | POA: Diagnosis not present

## 2021-05-14 DIAGNOSIS — I130 Hypertensive heart and chronic kidney disease with heart failure and stage 1 through stage 4 chronic kidney disease, or unspecified chronic kidney disease: Secondary | ICD-10-CM | POA: Diagnosis not present

## 2021-05-14 DIAGNOSIS — E1122 Type 2 diabetes mellitus with diabetic chronic kidney disease: Secondary | ICD-10-CM | POA: Diagnosis not present

## 2021-05-14 DIAGNOSIS — I2510 Atherosclerotic heart disease of native coronary artery without angina pectoris: Secondary | ICD-10-CM | POA: Diagnosis not present

## 2021-05-14 DIAGNOSIS — I480 Paroxysmal atrial fibrillation: Secondary | ICD-10-CM | POA: Diagnosis not present

## 2021-05-14 DIAGNOSIS — F3131 Bipolar disorder, current episode depressed, mild: Secondary | ICD-10-CM | POA: Diagnosis not present

## 2021-05-14 DIAGNOSIS — A419 Sepsis, unspecified organism: Secondary | ICD-10-CM | POA: Diagnosis not present

## 2021-05-14 DIAGNOSIS — I5033 Acute on chronic diastolic (congestive) heart failure: Secondary | ICD-10-CM | POA: Diagnosis not present

## 2021-05-14 DIAGNOSIS — R32 Unspecified urinary incontinence: Secondary | ICD-10-CM | POA: Diagnosis not present

## 2021-05-19 DIAGNOSIS — E1122 Type 2 diabetes mellitus with diabetic chronic kidney disease: Secondary | ICD-10-CM | POA: Diagnosis not present

## 2021-05-19 DIAGNOSIS — N1832 Chronic kidney disease, stage 3b: Secondary | ICD-10-CM | POA: Diagnosis not present

## 2021-05-19 DIAGNOSIS — I130 Hypertensive heart and chronic kidney disease with heart failure and stage 1 through stage 4 chronic kidney disease, or unspecified chronic kidney disease: Secondary | ICD-10-CM | POA: Diagnosis not present

## 2021-05-19 DIAGNOSIS — A419 Sepsis, unspecified organism: Secondary | ICD-10-CM | POA: Diagnosis not present

## 2021-05-19 DIAGNOSIS — I480 Paroxysmal atrial fibrillation: Secondary | ICD-10-CM | POA: Diagnosis not present

## 2021-05-19 DIAGNOSIS — F3131 Bipolar disorder, current episode depressed, mild: Secondary | ICD-10-CM | POA: Diagnosis not present

## 2021-05-19 DIAGNOSIS — R32 Unspecified urinary incontinence: Secondary | ICD-10-CM | POA: Diagnosis not present

## 2021-05-19 DIAGNOSIS — I5033 Acute on chronic diastolic (congestive) heart failure: Secondary | ICD-10-CM | POA: Diagnosis not present

## 2021-05-19 DIAGNOSIS — I2510 Atherosclerotic heart disease of native coronary artery without angina pectoris: Secondary | ICD-10-CM | POA: Diagnosis not present

## 2021-05-20 DIAGNOSIS — I480 Paroxysmal atrial fibrillation: Secondary | ICD-10-CM | POA: Diagnosis not present

## 2021-05-20 DIAGNOSIS — I130 Hypertensive heart and chronic kidney disease with heart failure and stage 1 through stage 4 chronic kidney disease, or unspecified chronic kidney disease: Secondary | ICD-10-CM | POA: Diagnosis not present

## 2021-05-20 DIAGNOSIS — I5033 Acute on chronic diastolic (congestive) heart failure: Secondary | ICD-10-CM | POA: Diagnosis not present

## 2021-05-20 DIAGNOSIS — A419 Sepsis, unspecified organism: Secondary | ICD-10-CM | POA: Diagnosis not present

## 2021-05-20 DIAGNOSIS — E1122 Type 2 diabetes mellitus with diabetic chronic kidney disease: Secondary | ICD-10-CM | POA: Diagnosis not present

## 2021-05-20 DIAGNOSIS — N1832 Chronic kidney disease, stage 3b: Secondary | ICD-10-CM | POA: Diagnosis not present

## 2021-05-20 DIAGNOSIS — F3131 Bipolar disorder, current episode depressed, mild: Secondary | ICD-10-CM | POA: Diagnosis not present

## 2021-05-20 DIAGNOSIS — I2510 Atherosclerotic heart disease of native coronary artery without angina pectoris: Secondary | ICD-10-CM | POA: Diagnosis not present

## 2021-05-20 DIAGNOSIS — R32 Unspecified urinary incontinence: Secondary | ICD-10-CM | POA: Diagnosis not present

## 2021-05-24 DIAGNOSIS — A419 Sepsis, unspecified organism: Secondary | ICD-10-CM | POA: Diagnosis not present

## 2021-05-24 DIAGNOSIS — E1122 Type 2 diabetes mellitus with diabetic chronic kidney disease: Secondary | ICD-10-CM | POA: Diagnosis not present

## 2021-05-24 DIAGNOSIS — I5033 Acute on chronic diastolic (congestive) heart failure: Secondary | ICD-10-CM | POA: Diagnosis not present

## 2021-05-24 DIAGNOSIS — I130 Hypertensive heart and chronic kidney disease with heart failure and stage 1 through stage 4 chronic kidney disease, or unspecified chronic kidney disease: Secondary | ICD-10-CM | POA: Diagnosis not present

## 2021-05-24 DIAGNOSIS — I480 Paroxysmal atrial fibrillation: Secondary | ICD-10-CM | POA: Diagnosis not present

## 2021-05-24 DIAGNOSIS — F3131 Bipolar disorder, current episode depressed, mild: Secondary | ICD-10-CM | POA: Diagnosis not present

## 2021-05-24 DIAGNOSIS — R32 Unspecified urinary incontinence: Secondary | ICD-10-CM | POA: Diagnosis not present

## 2021-05-24 DIAGNOSIS — N1832 Chronic kidney disease, stage 3b: Secondary | ICD-10-CM | POA: Diagnosis not present

## 2021-05-24 DIAGNOSIS — I2510 Atherosclerotic heart disease of native coronary artery without angina pectoris: Secondary | ICD-10-CM | POA: Diagnosis not present

## 2021-05-26 DIAGNOSIS — F3131 Bipolar disorder, current episode depressed, mild: Secondary | ICD-10-CM | POA: Diagnosis not present

## 2021-05-26 DIAGNOSIS — I5033 Acute on chronic diastolic (congestive) heart failure: Secondary | ICD-10-CM | POA: Diagnosis not present

## 2021-05-26 DIAGNOSIS — A419 Sepsis, unspecified organism: Secondary | ICD-10-CM | POA: Diagnosis not present

## 2021-05-26 DIAGNOSIS — I130 Hypertensive heart and chronic kidney disease with heart failure and stage 1 through stage 4 chronic kidney disease, or unspecified chronic kidney disease: Secondary | ICD-10-CM | POA: Diagnosis not present

## 2021-05-26 DIAGNOSIS — I2510 Atherosclerotic heart disease of native coronary artery without angina pectoris: Secondary | ICD-10-CM | POA: Diagnosis not present

## 2021-05-26 DIAGNOSIS — I480 Paroxysmal atrial fibrillation: Secondary | ICD-10-CM | POA: Diagnosis not present

## 2021-05-26 DIAGNOSIS — N1832 Chronic kidney disease, stage 3b: Secondary | ICD-10-CM | POA: Diagnosis not present

## 2021-05-26 DIAGNOSIS — E1122 Type 2 diabetes mellitus with diabetic chronic kidney disease: Secondary | ICD-10-CM | POA: Diagnosis not present

## 2021-05-26 DIAGNOSIS — R32 Unspecified urinary incontinence: Secondary | ICD-10-CM | POA: Diagnosis not present

## 2021-05-28 DIAGNOSIS — A419 Sepsis, unspecified organism: Secondary | ICD-10-CM | POA: Diagnosis not present

## 2021-05-28 DIAGNOSIS — R32 Unspecified urinary incontinence: Secondary | ICD-10-CM | POA: Diagnosis not present

## 2021-05-28 DIAGNOSIS — I480 Paroxysmal atrial fibrillation: Secondary | ICD-10-CM | POA: Diagnosis not present

## 2021-05-28 DIAGNOSIS — I2510 Atherosclerotic heart disease of native coronary artery without angina pectoris: Secondary | ICD-10-CM | POA: Diagnosis not present

## 2021-05-28 DIAGNOSIS — I130 Hypertensive heart and chronic kidney disease with heart failure and stage 1 through stage 4 chronic kidney disease, or unspecified chronic kidney disease: Secondary | ICD-10-CM | POA: Diagnosis not present

## 2021-05-28 DIAGNOSIS — I5033 Acute on chronic diastolic (congestive) heart failure: Secondary | ICD-10-CM | POA: Diagnosis not present

## 2021-05-28 DIAGNOSIS — N1832 Chronic kidney disease, stage 3b: Secondary | ICD-10-CM | POA: Diagnosis not present

## 2021-05-28 DIAGNOSIS — F3131 Bipolar disorder, current episode depressed, mild: Secondary | ICD-10-CM | POA: Diagnosis not present

## 2021-05-28 DIAGNOSIS — E1122 Type 2 diabetes mellitus with diabetic chronic kidney disease: Secondary | ICD-10-CM | POA: Diagnosis not present

## 2021-06-03 DIAGNOSIS — A419 Sepsis, unspecified organism: Secondary | ICD-10-CM | POA: Diagnosis not present

## 2021-06-03 DIAGNOSIS — I2510 Atherosclerotic heart disease of native coronary artery without angina pectoris: Secondary | ICD-10-CM | POA: Diagnosis not present

## 2021-06-03 DIAGNOSIS — N1832 Chronic kidney disease, stage 3b: Secondary | ICD-10-CM | POA: Diagnosis not present

## 2021-06-03 DIAGNOSIS — E1122 Type 2 diabetes mellitus with diabetic chronic kidney disease: Secondary | ICD-10-CM | POA: Diagnosis not present

## 2021-06-03 DIAGNOSIS — I130 Hypertensive heart and chronic kidney disease with heart failure and stage 1 through stage 4 chronic kidney disease, or unspecified chronic kidney disease: Secondary | ICD-10-CM | POA: Diagnosis not present

## 2021-06-03 DIAGNOSIS — F3131 Bipolar disorder, current episode depressed, mild: Secondary | ICD-10-CM | POA: Diagnosis not present

## 2021-06-03 DIAGNOSIS — R32 Unspecified urinary incontinence: Secondary | ICD-10-CM | POA: Diagnosis not present

## 2021-06-03 DIAGNOSIS — I5033 Acute on chronic diastolic (congestive) heart failure: Secondary | ICD-10-CM | POA: Diagnosis not present

## 2021-06-03 DIAGNOSIS — I480 Paroxysmal atrial fibrillation: Secondary | ICD-10-CM | POA: Diagnosis not present

## 2021-06-04 DIAGNOSIS — E1122 Type 2 diabetes mellitus with diabetic chronic kidney disease: Secondary | ICD-10-CM | POA: Diagnosis not present

## 2021-06-04 DIAGNOSIS — I480 Paroxysmal atrial fibrillation: Secondary | ICD-10-CM | POA: Diagnosis not present

## 2021-06-04 DIAGNOSIS — A419 Sepsis, unspecified organism: Secondary | ICD-10-CM | POA: Diagnosis not present

## 2021-06-04 DIAGNOSIS — F3131 Bipolar disorder, current episode depressed, mild: Secondary | ICD-10-CM | POA: Diagnosis not present

## 2021-06-04 DIAGNOSIS — I130 Hypertensive heart and chronic kidney disease with heart failure and stage 1 through stage 4 chronic kidney disease, or unspecified chronic kidney disease: Secondary | ICD-10-CM | POA: Diagnosis not present

## 2021-06-04 DIAGNOSIS — I5033 Acute on chronic diastolic (congestive) heart failure: Secondary | ICD-10-CM | POA: Diagnosis not present

## 2021-06-04 DIAGNOSIS — I2510 Atherosclerotic heart disease of native coronary artery without angina pectoris: Secondary | ICD-10-CM | POA: Diagnosis not present

## 2021-06-04 DIAGNOSIS — R32 Unspecified urinary incontinence: Secondary | ICD-10-CM | POA: Diagnosis not present

## 2021-06-04 DIAGNOSIS — N1832 Chronic kidney disease, stage 3b: Secondary | ICD-10-CM | POA: Diagnosis not present

## 2021-06-09 DIAGNOSIS — A419 Sepsis, unspecified organism: Secondary | ICD-10-CM | POA: Diagnosis not present

## 2021-06-09 DIAGNOSIS — I130 Hypertensive heart and chronic kidney disease with heart failure and stage 1 through stage 4 chronic kidney disease, or unspecified chronic kidney disease: Secondary | ICD-10-CM | POA: Diagnosis not present

## 2021-06-09 DIAGNOSIS — F3131 Bipolar disorder, current episode depressed, mild: Secondary | ICD-10-CM | POA: Diagnosis not present

## 2021-06-09 DIAGNOSIS — I5033 Acute on chronic diastolic (congestive) heart failure: Secondary | ICD-10-CM | POA: Diagnosis not present

## 2021-06-09 DIAGNOSIS — N1832 Chronic kidney disease, stage 3b: Secondary | ICD-10-CM | POA: Diagnosis not present

## 2021-06-09 DIAGNOSIS — E1122 Type 2 diabetes mellitus with diabetic chronic kidney disease: Secondary | ICD-10-CM | POA: Diagnosis not present

## 2021-06-09 DIAGNOSIS — I2510 Atherosclerotic heart disease of native coronary artery without angina pectoris: Secondary | ICD-10-CM | POA: Diagnosis not present

## 2021-06-09 DIAGNOSIS — I480 Paroxysmal atrial fibrillation: Secondary | ICD-10-CM | POA: Diagnosis not present

## 2021-06-09 DIAGNOSIS — R32 Unspecified urinary incontinence: Secondary | ICD-10-CM | POA: Diagnosis not present

## 2021-06-10 DIAGNOSIS — F3131 Bipolar disorder, current episode depressed, mild: Secondary | ICD-10-CM | POA: Diagnosis not present

## 2021-06-10 DIAGNOSIS — I2510 Atherosclerotic heart disease of native coronary artery without angina pectoris: Secondary | ICD-10-CM | POA: Diagnosis not present

## 2021-06-10 DIAGNOSIS — N1832 Chronic kidney disease, stage 3b: Secondary | ICD-10-CM | POA: Diagnosis not present

## 2021-06-10 DIAGNOSIS — E1122 Type 2 diabetes mellitus with diabetic chronic kidney disease: Secondary | ICD-10-CM | POA: Diagnosis not present

## 2021-06-10 DIAGNOSIS — R32 Unspecified urinary incontinence: Secondary | ICD-10-CM | POA: Diagnosis not present

## 2021-06-10 DIAGNOSIS — A419 Sepsis, unspecified organism: Secondary | ICD-10-CM | POA: Diagnosis not present

## 2021-06-10 DIAGNOSIS — I130 Hypertensive heart and chronic kidney disease with heart failure and stage 1 through stage 4 chronic kidney disease, or unspecified chronic kidney disease: Secondary | ICD-10-CM | POA: Diagnosis not present

## 2021-06-10 DIAGNOSIS — I480 Paroxysmal atrial fibrillation: Secondary | ICD-10-CM | POA: Diagnosis not present

## 2021-06-10 DIAGNOSIS — I5033 Acute on chronic diastolic (congestive) heart failure: Secondary | ICD-10-CM | POA: Diagnosis not present

## 2021-06-18 DIAGNOSIS — F3131 Bipolar disorder, current episode depressed, mild: Secondary | ICD-10-CM | POA: Diagnosis not present

## 2021-06-18 DIAGNOSIS — A419 Sepsis, unspecified organism: Secondary | ICD-10-CM | POA: Diagnosis not present

## 2021-06-18 DIAGNOSIS — I130 Hypertensive heart and chronic kidney disease with heart failure and stage 1 through stage 4 chronic kidney disease, or unspecified chronic kidney disease: Secondary | ICD-10-CM | POA: Diagnosis not present

## 2021-06-18 DIAGNOSIS — I2510 Atherosclerotic heart disease of native coronary artery without angina pectoris: Secondary | ICD-10-CM | POA: Diagnosis not present

## 2021-06-18 DIAGNOSIS — E1122 Type 2 diabetes mellitus with diabetic chronic kidney disease: Secondary | ICD-10-CM | POA: Diagnosis not present

## 2021-06-18 DIAGNOSIS — N1832 Chronic kidney disease, stage 3b: Secondary | ICD-10-CM | POA: Diagnosis not present

## 2021-06-18 DIAGNOSIS — I480 Paroxysmal atrial fibrillation: Secondary | ICD-10-CM | POA: Diagnosis not present

## 2021-06-18 DIAGNOSIS — I5033 Acute on chronic diastolic (congestive) heart failure: Secondary | ICD-10-CM | POA: Diagnosis not present

## 2021-06-18 DIAGNOSIS — R32 Unspecified urinary incontinence: Secondary | ICD-10-CM | POA: Diagnosis not present

## 2021-07-14 DIAGNOSIS — N183 Chronic kidney disease, stage 3 unspecified: Secondary | ICD-10-CM | POA: Diagnosis not present

## 2021-07-14 DIAGNOSIS — E559 Vitamin D deficiency, unspecified: Secondary | ICD-10-CM | POA: Diagnosis not present

## 2021-07-14 DIAGNOSIS — I959 Hypotension, unspecified: Secondary | ICD-10-CM | POA: Diagnosis not present

## 2021-07-21 DIAGNOSIS — E782 Mixed hyperlipidemia: Secondary | ICD-10-CM | POA: Diagnosis not present

## 2021-07-21 DIAGNOSIS — I10 Essential (primary) hypertension: Secondary | ICD-10-CM | POA: Diagnosis not present

## 2021-07-21 DIAGNOSIS — I509 Heart failure, unspecified: Secondary | ICD-10-CM | POA: Diagnosis not present

## 2021-07-21 DIAGNOSIS — G4739 Other sleep apnea: Secondary | ICD-10-CM | POA: Diagnosis not present

## 2021-07-21 DIAGNOSIS — E669 Obesity, unspecified: Secondary | ICD-10-CM | POA: Diagnosis not present

## 2021-07-28 ENCOUNTER — Ambulatory Visit: Payer: Medicare PPO | Admitting: Podiatry

## 2021-07-28 ENCOUNTER — Other Ambulatory Visit: Payer: Self-pay

## 2021-07-28 DIAGNOSIS — M79675 Pain in left toe(s): Secondary | ICD-10-CM

## 2021-07-28 DIAGNOSIS — E1149 Type 2 diabetes mellitus with other diabetic neurological complication: Secondary | ICD-10-CM

## 2021-07-28 DIAGNOSIS — B351 Tinea unguium: Secondary | ICD-10-CM | POA: Diagnosis not present

## 2021-07-28 DIAGNOSIS — M79674 Pain in right toe(s): Secondary | ICD-10-CM | POA: Diagnosis not present

## 2021-07-28 DIAGNOSIS — S91209A Unspecified open wound of unspecified toe(s) with damage to nail, initial encounter: Secondary | ICD-10-CM | POA: Diagnosis not present

## 2021-07-28 DIAGNOSIS — T148XXA Other injury of unspecified body region, initial encounter: Secondary | ICD-10-CM | POA: Diagnosis not present

## 2021-07-28 DIAGNOSIS — I739 Peripheral vascular disease, unspecified: Secondary | ICD-10-CM

## 2021-07-28 DIAGNOSIS — Z7901 Long term (current) use of anticoagulants: Secondary | ICD-10-CM

## 2021-07-28 MED ORDER — DOXYCYCLINE HYCLATE 100 MG PO TABS: 100.0000 mg | ORAL_TABLET | Freq: Two times a day (BID) | ORAL | 0 refills | Status: DC

## 2021-07-28 MED ORDER — MUPIROCIN 2 % EX OINT: 1.0000 "application " | TOPICAL_OINTMENT | Freq: Two times a day (BID) | CUTANEOUS | 2 refills | Status: DC

## 2021-07-28 NOTE — Progress Notes (Signed)
Subjective: 71 year old male presents the office today for bleeding to his left third toe.  He states that he pulled his toenail off himself today as he thought there is a new nail underneath it and cause bleeding.  In general he states his nails are thick and elongated like to have them trimmed as are causing discomfort as well.  He is on Xarelto.  Objective: AAO x3, NAD DP/PT pulses decreased bilaterally, CRT less than 3 seconds Sensation decreased with Semmes Weinstein monofilament  Granular nail bed present on the left third toe, he ripped the toenail off there is no active bleeding.  There are some mild edema and faint surrounding erythema likely due to the trauma.  There is also small hemorrhagic blister in the dorsal aspect of the right third toe without any edema, erythema.  In general nails are hypertrophic, dystrophic with brown discoloration.  Nails 1 through 5 are affected except the left third toenail which we removed.  No open lesions otherwise. No open lesions or pre-ulcerative lesions.  No pain with calf compression, swelling, warmth, erythema  Assessment: Traumatic nail removal left third toenail, blister right fourth toe, symptomatic onychomycosis, on Xarelto  Plan: -All treatment options discussed with the patient including all alternatives, risks, complications.  -Given the wound on the left third toe with mild erythema and edema which is likely mostly due to a traumatic cause with his history of subcu doxycycline was prescribed.  Also mupirocin ointment dressing changes twice a day. -Ordered arterial studies given decreased pulses -Monitor the small blister on the right fourth toe for any signs or symptoms of infection -Sharply debrided the nails x9 without any complications or bleeding. -Patient encouraged to call the office with any questions, concerns, change in symptoms.   Return in about 2 weeks (around 08/11/2021) for toe ulcer/blister.  Vivi Barrack DPM

## 2021-08-10 DIAGNOSIS — N183 Chronic kidney disease, stage 3 unspecified: Secondary | ICD-10-CM | POA: Diagnosis not present

## 2021-08-13 ENCOUNTER — Other Ambulatory Visit: Payer: Self-pay

## 2021-08-13 ENCOUNTER — Encounter (HOSPITAL_COMMUNITY): Payer: Medicare PPO

## 2021-08-13 ENCOUNTER — Ambulatory Visit (INDEPENDENT_AMBULATORY_CARE_PROVIDER_SITE_OTHER): Payer: Medicare PPO | Admitting: Podiatry

## 2021-08-13 DIAGNOSIS — I739 Peripheral vascular disease, unspecified: Secondary | ICD-10-CM | POA: Diagnosis not present

## 2021-08-13 DIAGNOSIS — S91209D Unspecified open wound of unspecified toe(s) with damage to nail, subsequent encounter: Secondary | ICD-10-CM | POA: Diagnosis not present

## 2021-08-13 DIAGNOSIS — T148XXA Other injury of unspecified body region, initial encounter: Secondary | ICD-10-CM | POA: Diagnosis not present

## 2021-08-13 NOTE — Patient Instructions (Signed)
Keep a small amount of antibiotic ointment and bandage on during the day. You can leave the toe open or apply just band-aid without the ointment at night to allow it to dry.   Monitor for any signs/symptoms of infection. Call the office immediately if any occur or go directly to the emergency room. Call with any questions/concerns.

## 2021-08-19 NOTE — Progress Notes (Signed)
Subjective: 70 year old male presents the office today for follow-up evaluation after traumatic avulsion left third digit toenail.  He states that it is healing.  No swelling or redness or any drainage that he reports.  The blister on the right fourth toe has been doing well.  No new concerns.  Denies any fevers or chills.  Awaiting arterial studies.  Objective: AAO x3, NAD DP/PT pulses decreased bilaterally, CRT less than 3 seconds Sensation decreased with Semmes Weinstein monofilament  Granular nail bed present on the left third toe however it appears the skin is scabbed over.  There is minimal edema.  No erythema, drainage or pus or ascending cellulitis.  No fluctuation crepitation.  There is no malodor. Hemorrhagic blister without any fluid present right third toe without any edema, erythema. No other open lesions or preulcerative lesions. No open lesions or pre-ulcerative lesions.  No pain with calf compression, swelling, warmth, erythema  Assessment: Traumatic nail removal left third toenail, blister right toe  Plan: -All treatment options discussed with the patient including all alternatives, risks, complications.  -Continue washing with soap and water daily.  Apply a small amount of antibiotic ointment during the day leave the area open at nighttime.  Monitor for any signs or symptoms of infection.  Monitor the right toe as well from the flat blister.  Monitor for any skin breakdown or signs of infection. -Arterial studies scheduled for next week -Monitor for any clinical signs or symptoms of infection and directed to call the office immediately should any occur or go to the ER.  Return in about 2 weeks (around 08/27/2021) for toe ulcer.  Vivi Barrack DPM

## 2021-08-20 ENCOUNTER — Encounter (HOSPITAL_COMMUNITY): Payer: Medicare PPO

## 2021-08-24 ENCOUNTER — Other Ambulatory Visit: Payer: Self-pay

## 2021-08-24 ENCOUNTER — Ambulatory Visit (HOSPITAL_COMMUNITY)
Admission: RE | Admit: 2021-08-24 | Discharge: 2021-08-24 | Disposition: A | Discharge status: Home / Self Care | Payer: Medicare PPO | Source: Ambulatory Visit | Attending: Podiatry | Admitting: Podiatry

## 2021-08-24 DIAGNOSIS — I739 Peripheral vascular disease, unspecified: Secondary | ICD-10-CM | POA: Diagnosis not present

## 2021-08-25 ENCOUNTER — Other Ambulatory Visit: Payer: Self-pay | Admitting: Podiatry

## 2021-08-25 DIAGNOSIS — I739 Peripheral vascular disease, unspecified: Secondary | ICD-10-CM

## 2021-08-26 ENCOUNTER — Other Ambulatory Visit: Payer: Self-pay

## 2021-08-26 ENCOUNTER — Ambulatory Visit (INDEPENDENT_AMBULATORY_CARE_PROVIDER_SITE_OTHER): Payer: Medicare PPO | Admitting: Podiatry

## 2021-08-26 DIAGNOSIS — L97521 Non-pressure chronic ulcer of other part of left foot limited to breakdown of skin: Secondary | ICD-10-CM

## 2021-08-26 DIAGNOSIS — I739 Peripheral vascular disease, unspecified: Secondary | ICD-10-CM

## 2021-08-26 DIAGNOSIS — S91209D Unspecified open wound of unspecified toe(s) with damage to nail, subsequent encounter: Secondary | ICD-10-CM | POA: Diagnosis not present

## 2021-08-28 NOTE — Progress Notes (Signed)
Subjective: 70 year old male presents the office today with his wife for follow-up evaluation after having traumatic nail avulsion of the left third toe.  His main concern is asking the nails to grow back.  He has a blister now on the left fourth toe as well.  Denies any swelling or redness or any drainage.  He has had a circulation test performed since I last saw him as well.    Objective: AAO x3, NAD DP/PT pulses decreased bilaterally Along the left third toe nailbed granular wound is still evident but started to scab over.  No edema, erythema, drainage or pus or signs of infection.  Superficial wound present on the left fourth toe as well today.  No edema, erythema or signs of infection.  There is no open lesions otherwise. No edema, erythema, increase in warmth to bilateral lower extremities.  No pain with calf compression, swelling, warmth, erythema  Assessment: Left foot ulcerations with PAD  Plan: -All treatment options discussed with the patient including all alternatives, risks, complications.  -Other debrided some loose tissue from the wounds today without any complications or bleeding.  Recommended continue with daily dressing changes with a small amount of antibiotic treatment.  Vascular surgery consult been placed.  Monitor closely for any signs or symptoms of infection. -Patient encouraged to call the office with any questions, concerns, change in symptoms.   Vivi Barrack DPM

## 2021-09-13 NOTE — Progress Notes (Signed)
Office Note     CC:  Nonhealing left foot wounds Requesting Provider:  [name], MD  HPI: Joycie Peek Quang is a morbidly obese 70 y.o. (Feb 12, 1951) male presenting at the request of Ovid Curd, DPM after being evaluated in clinic for traumatic nail avulsion of the left third toe and ulceration on the left fourth toe.  The patient was found to have decreased pedal pulses bilaterally and therefore underwent ABI testing.  On exam, Kedar was accompanied by his wife and doing well.  He stated the wounds have been present for 2 months.  He denies erythema or induration.  Treylin has difficulty with ambulation requiring a rolling walker and needs assistance moving from a chair to standing.  This appears to be due to his morbid obesity as well as deconditioning.  He denies rest pain, but has longstanding diabetes with peripheral neuropathy.   The pt is on a statin for cholesterol management.  The pt is not on a daily aspirin.   Other AC:  Xarelto, plavix.   The pt is  on medications for hypertension.   The pt is  diabetic.  Tobacco hx:  never  Past Medical History:  Diagnosis Date   Bilateral lower extremity edema: chronic with venous stasis changes 06/02/2014   Bipolar 1 disorder (HCC)    Bipolar disorder (HCC)    CHF (congestive heart failure) (HCC)    Chronic kidney disease    Gout    Gout    Hx of blood clots    Hypertension    Mood swings    OSA on CPAP 06/02/2014   Other and unspecified hyperlipidemia 06/02/2014   Type II or unspecified type diabetes mellitus with unspecified complication, uncontrolled 06/02/2014    Past Surgical History:  Procedure Laterality Date   LEFT HEART CATH AND CORONARY ANGIOGRAPHY Left 04/01/2020   Procedure: LEFT HEART CATH AND CORONARY ANGIOGRAPHY;  Surgeon: Laurier Nancy, MD;  Location: [facility] INVASIVE CV LAB;  Service: Cardiovascular;  Laterality: Left;    Social History   Socioeconomic History   Marital status: Divorced    Spouse name: Not  on file   Number of children: Not on file   Years of education: Not on file   Highest education level: Not on file  Occupational History   Not on file  Tobacco Use   Smoking status: Never   Smokeless tobacco: Never  Substance and Sexual Activity   Alcohol use: No   Drug use: No   Sexual activity: Not Currently  Other Topics Concern   Not on file  Social History Narrative   *The patient is divorced and is currently on disability. He lives alone and has one daughter.             Social Determinants of Health   Financial Resource Strain: Not on file  Food Insecurity: Not on file  Transportation Needs: Not on file  Physical Activity: Not on file  Stress: Not on file  Social Connections: Not on file  Intimate Partner Violence: Not on file    Family History  Problem Relation Age of Onset   Dementia Mother    Arthritis Father    Hypertension Father    Mental illness Sister    Diabetes Sister    Heart failure Neg Hx    Kidney failure Neg Hx    Cancer Neg Hx     Current Outpatient Medications  Medication Sig Dispense Refill   ARIPiprazole (ABILIFY) 20 MG tablet Take 1  tablet (20 mg total) by mouth daily. 30 tablet 0   atorvastatin (LIPITOR) 10 MG tablet Take 1 tablet (10 mg total) by mouth daily at 6 PM. (Patient taking differently: Take 10 mg by mouth daily.) 30 tablet 0   buPROPion (WELLBUTRIN) 75 MG tablet Take 1 tablet (75 mg total) by mouth daily. 30 tablet 0   clopidogrel (PLAVIX) 75 MG tablet Take 75 mg by mouth daily.      divalproex (DEPAKOTE) 500 MG DR tablet Take 1 tablet (500 mg total) by mouth every 12 (twelve) hours. 60 tablet 0   doxycycline (VIBRA-TABS) 100 MG tablet Take 1 tablet (100 mg total) by mouth 2 (two) times daily. 20 tablet 0   empagliflozin (JARDIANCE) 10 MG TABS tablet Take 10 mg by mouth daily.      furosemide (LASIX) 40 MG tablet Take 1 tablet (40 mg total) by mouth daily. 30 tablet    glimepiride (AMARYL) 2 MG tablet Take 2 mg by mouth  daily with breakfast.     insulin glargine (LANTUS) 100 UNIT/ML injection Inject 5-6 Units into the skin at bedtime.     liothyronine (CYTOMEL) 25 MCG tablet Take 25 mcg by mouth daily before breakfast.      metoprolol succinate (TOPROL-XL) 25 MG 24 hr tablet Take 2 tablets (50 mg total) by mouth daily. Take with or immediately following a meal.     mirtazapine (REMERON SOL-TAB) 15 MG disintegrating tablet Take 1 tablet (15 mg total) by mouth at bedtime. 30 tablet 0   mupirocin ointment (BACTROBAN) 2 % Apply 1 application topically 2 (two) times daily. 30 g 2   polyethylene glycol powder (GLYCOLAX/MIRALAX) 17 GM/SCOOP powder Take 17 g by mouth daily. 255 g 0   rivaroxaban (XARELTO) 20 MG TABS tablet Take 1 tablet (20 mg total) by mouth daily with supper. (Patient taking differently: Take 20 mg by mouth daily.) 30 tablet 0   No current facility-administered medications for this visit.    Allergies  Allergen Reactions   Amlodipine Swelling and Other (See Comments)    Legs became swollen   Onglyza [Saxagliptin] Other (See Comments)    "Allergic," per [facility]   Zyprexa [Olanzapine] Other (See Comments)    Allergy noted by ACT Team. No reaction specified (??)- "Allergic," per [facility]     REVIEW OF SYSTEMS:   [X]  denotes positive finding, [ ]  denotes negative finding Cardiac  Comments:  Chest pain or chest pressure:    Shortness of breath upon exertion:    Short of breath when lying flat:    Irregular heart rhythm:        Vascular    Pain in calf, thigh, or hip brought on by ambulation:    Pain in feet at night that wakes you up from your sleep:     Blood clot in your veins:    Leg swelling:  X       Pulmonary    Oxygen at home:    Productive cough:     Wheezing:         Neurologic    Sudden weakness in arms or legs:     Sudden numbness in arms or legs:     Sudden onset of difficulty speaking or slurred speech:    Temporary loss of vision in one eye:      Problems with dizziness:         Gastrointestinal    Blood in stool:     Vomited  blood:         Genitourinary    Burning when urinating:     Blood in urine:        Psychiatric    Major depression:         Hematologic    Bleeding problems:    Problems with blood clotting too easily:        Skin    Rashes or ulcers:        Constitutional    Fever or chills:      PHYSICAL EXAMINATION:  There were no vitals filed for this visit.  General:  WDWN in NAD; vital signs documented above, obese Gait: Not observed HENT: WNL, normocephalic Pulmonary: normal non-labored breathing , without Rales, rhonchi,  wheezing Cardiac: regular HR,  Abdomen: soft, NT, no masses Skin: without rashes Vascular Exam/Pulses:  Right Left  Radial 2+ (normal) 2+ (normal)  Ulnar 2+ (normal) 2+ (normal)  Femoral 2+ 2+  Popliteal    DP absent absent  PT absent absent   Extremities: without ischemic changes, without Gangrene , without cellulitis; with open wounds; left third and fourth toe, right second toe. Musculoskeletal: no muscle wasting or atrophy  Neurologic: A&O X 3;  No focal weakness or paresthesias are detected Psychiatric:  The pt has Normal affect.   Non-Invasive Vascular Imaging:       ASSESSMENT/PLAN:: 70 y.o. male presenting with nonhealing foot wounds on the left third and fourth toes.   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   ABIs were independently evaluated demonstrated monophasic waveforms in the tibial vessels with poor waveforms and low pressures at the toes.  Current toe pressure is not high enough to heal the left foot wounds.  Thiago would benefit from diagnostic angiography of bilateral lower extremities but has CKD3b. Therefore we will start with angiography of the left lower extremity with possible intervention in an effort to define and improve left lower extremity perfusion.  After discussing the risks and  benefits, Issaiah elected to proceed.    I discussed that not all peripheral arterial disease can be solved with the use of wires and catheters, but some people require operative intervention.  Rodger is a poor surgical candidate due to his physical deconditioning and walker-dependent ambulatory status.  I told his wife, that all attempts would be made to fix him from an endovascular perspective as open surgery would likely lead to a nonambulatory state.  Prinston has a history of blood clots however his most recent bilateral lower extremity DVT ultrasound (2021) was negative for thrombus.  I asked him to hold his Xarelto for the next 2 days prior to intervention.   Victorino Sparrow, MD [facility] [phone]

## 2021-09-14 ENCOUNTER — Other Ambulatory Visit: Payer: Self-pay

## 2021-09-14 ENCOUNTER — Ambulatory Visit: Payer: Medicare PPO | Admitting: Vascular Surgery

## 2021-09-14 ENCOUNTER — Encounter: Payer: Self-pay | Admitting: Vascular Surgery

## 2021-09-14 VITALS — BP 99/67 | HR 83 | Temp 98.7°F | Resp 20 | Ht 72.0 in | Wt 274.0 lb

## 2021-09-14 DIAGNOSIS — I739 Peripheral vascular disease, unspecified: Secondary | ICD-10-CM

## 2021-09-16 ENCOUNTER — Ambulatory Visit (HOSPITAL_COMMUNITY)
Admission: RE | Disposition: A | Discharge status: Home / Self Care | Payer: Self-pay | Source: Home / Self Care | Attending: Vascular Surgery

## 2021-09-16 ENCOUNTER — Other Ambulatory Visit: Payer: Self-pay

## 2021-09-16 ENCOUNTER — Ambulatory Visit (HOSPITAL_COMMUNITY)
Admission: RE | Admit: 2021-09-16 | Discharge: 2021-09-16 | Disposition: A | Discharge status: Home / Self Care | Payer: Medicare PPO | Attending: Vascular Surgery | Admitting: Vascular Surgery

## 2021-09-16 DIAGNOSIS — Z888 Allergy status to other drugs, medicaments and biological substances status: Secondary | ICD-10-CM | POA: Insufficient documentation

## 2021-09-16 DIAGNOSIS — E1151 Type 2 diabetes mellitus with diabetic peripheral angiopathy without gangrene: Secondary | ICD-10-CM | POA: Insufficient documentation

## 2021-09-16 DIAGNOSIS — Z79899 Other long term (current) drug therapy: Secondary | ICD-10-CM | POA: Insufficient documentation

## 2021-09-16 DIAGNOSIS — E11621 Type 2 diabetes mellitus with foot ulcer: Secondary | ICD-10-CM | POA: Insufficient documentation

## 2021-09-16 DIAGNOSIS — Z7901 Long term (current) use of anticoagulants: Secondary | ICD-10-CM | POA: Insufficient documentation

## 2021-09-16 DIAGNOSIS — Z7984 Long term (current) use of oral hypoglycemic drugs: Secondary | ICD-10-CM | POA: Diagnosis not present

## 2021-09-16 DIAGNOSIS — E1140 Type 2 diabetes mellitus with diabetic neuropathy, unspecified: Secondary | ICD-10-CM | POA: Insufficient documentation

## 2021-09-16 DIAGNOSIS — E1122 Type 2 diabetes mellitus with diabetic chronic kidney disease: Secondary | ICD-10-CM | POA: Insufficient documentation

## 2021-09-16 DIAGNOSIS — Z794 Long term (current) use of insulin: Secondary | ICD-10-CM | POA: Diagnosis not present

## 2021-09-16 DIAGNOSIS — I70245 Atherosclerosis of native arteries of left leg with ulceration of other part of foot: Secondary | ICD-10-CM | POA: Diagnosis not present

## 2021-09-16 DIAGNOSIS — Z6837 Body mass index (BMI) 37.0-37.9, adult: Secondary | ICD-10-CM | POA: Insufficient documentation

## 2021-09-16 DIAGNOSIS — I129 Hypertensive chronic kidney disease with stage 1 through stage 4 chronic kidney disease, or unspecified chronic kidney disease: Secondary | ICD-10-CM | POA: Insufficient documentation

## 2021-09-16 DIAGNOSIS — L97529 Non-pressure chronic ulcer of other part of left foot with unspecified severity: Secondary | ICD-10-CM | POA: Insufficient documentation

## 2021-09-16 DIAGNOSIS — N1832 Chronic kidney disease, stage 3b: Secondary | ICD-10-CM | POA: Diagnosis not present

## 2021-09-16 HISTORY — PX: ABDOMINAL AORTOGRAM W/LOWER EXTREMITY: CATH118223

## 2021-09-16 HISTORY — PX: PERIPHERAL VASCULAR BALLOON ANGIOPLASTY: CATH118281

## 2021-09-16 LAB — POCT I-STAT, CHEM 8
BUN: 37 mg/dL — ABNORMAL HIGH (ref 8–23)
Calcium, Ion: 1.13 mmol/L — ABNORMAL LOW (ref 1.15–1.40)
Chloride: 103 mmol/L (ref 98–111)
Creatinine, Ser: 1.9 mg/dL — ABNORMAL HIGH (ref 0.61–1.24)
Glucose, Bld: 181 mg/dL — ABNORMAL HIGH (ref 70–99)
HCT: 50 % (ref 39.0–52.0)
Hemoglobin: 17 g/dL (ref 13.0–17.0)
Potassium: 4.1 mmol/L (ref 3.5–5.1)
Sodium: 139 mmol/L (ref 135–145)
TCO2: 27 mmol/L (ref 22–32)

## 2021-09-16 SURGERY — ABDOMINAL AORTOGRAM W/LOWER EXTREMITY
Anesthesia: LOCAL

## 2021-09-16 MED ORDER — SODIUM CHLORIDE 0.9% FLUSH: 3.0000 mL | INTRAVENOUS | Status: DC | PRN

## 2021-09-16 MED ORDER — FENTANYL CITRATE (PF) 100 MCG/2ML IJ SOLN
INTRAMUSCULAR | Status: DC | PRN
  Administered 2021-09-16: 50 ug via INTRAVENOUS

## 2021-09-16 MED ORDER — LABETALOL HCL 5 MG/ML IV SOLN: 10.0000 mg | INTRAVENOUS | Status: DC | PRN

## 2021-09-16 MED ORDER — SODIUM CHLORIDE 0.9 % IV SOLN: 250.0000 mL | INTRAVENOUS | Status: DC | PRN

## 2021-09-16 MED ORDER — SODIUM CHLORIDE 0.9% FLUSH: 3.0000 mL | Freq: Two times a day (BID) | INTRAVENOUS | Status: DC

## 2021-09-16 MED ORDER — HEPARIN SODIUM (PORCINE) 1000 UNIT/ML IJ SOLN
INTRAMUSCULAR | Status: AC
  Filled 2021-09-16: qty 1

## 2021-09-16 MED ORDER — LIDOCAINE HCL (PF) 1 % IJ SOLN
INTRAMUSCULAR | Status: AC
  Filled 2021-09-16: qty 30

## 2021-09-16 MED ORDER — HEPARIN (PORCINE) IN NACL 1000-0.9 UT/500ML-% IV SOLN
INTRAVENOUS | Status: AC
  Filled 2021-09-16: qty 1000

## 2021-09-16 MED ORDER — HYDRALAZINE HCL 20 MG/ML IJ SOLN: 5.0000 mg | INTRAMUSCULAR | Status: DC | PRN

## 2021-09-16 MED ORDER — ONDANSETRON HCL 4 MG/2ML IJ SOLN: 4.0000 mg | Freq: Four times a day (QID) | INTRAMUSCULAR | Status: DC | PRN

## 2021-09-16 MED ORDER — FENTANYL CITRATE (PF) 100 MCG/2ML IJ SOLN
INTRAMUSCULAR | Status: AC
  Filled 2021-09-16: qty 2

## 2021-09-16 MED ORDER — HEPARIN (PORCINE) IN NACL 1000-0.9 UT/500ML-% IV SOLN
INTRAVENOUS | Status: DC | PRN
  Administered 2021-09-16 (×2): 500 mL

## 2021-09-16 MED ORDER — ACETAMINOPHEN 325 MG PO TABS: 650.0000 mg | ORAL_TABLET | ORAL | Status: DC | PRN

## 2021-09-16 MED ORDER — IODIXANOL 320 MG/ML IV SOLN
INTRAVENOUS | Status: DC | PRN
  Administered 2021-09-16: 100 mL via INTRA_ARTERIAL

## 2021-09-16 MED ORDER — SODIUM CHLORIDE 0.9 % WEIGHT BASED INFUSION: 125.0000 mL/h | INTRAVENOUS | Status: DC

## 2021-09-16 MED ORDER — MIDAZOLAM HCL 2 MG/2ML IJ SOLN
INTRAMUSCULAR | Status: DC | PRN
  Administered 2021-09-16: 1 mg via INTRAVENOUS

## 2021-09-16 MED ORDER — MIDAZOLAM HCL 2 MG/2ML IJ SOLN
INTRAMUSCULAR | Status: AC
  Filled 2021-09-16: qty 2

## 2021-09-16 MED ORDER — SODIUM CHLORIDE 0.9 % IV SOLN: INTRAVENOUS | Status: DC

## 2021-09-16 MED ORDER — HEPARIN SODIUM (PORCINE) 1000 UNIT/ML IJ SOLN
INTRAMUSCULAR | Status: DC | PRN
  Administered 2021-09-16: 10000 [IU] via INTRAVENOUS

## 2021-09-16 SURGICAL SUPPLY — 16 items
CATH ANGIO 5F PIGTAIL 65CM (CATHETERS) ×1 IMPLANT
CATH CROSS OVER TEMPO 5F (CATHETERS) ×1 IMPLANT
CATH QUICKCROSS .035X135CM (MICROCATHETER) ×1 IMPLANT
CATH STRAIGHT 5FR 65CM (CATHETERS) ×1 IMPLANT
CLOSURE PERCLOSE PROSTYLE (VASCULAR PRODUCTS) ×1 IMPLANT
GLIDEWIRE ADV .035X260CM (WIRE) ×1 IMPLANT
KIT MICROPUNCTURE NIT STIFF (SHEATH) ×1 IMPLANT
KIT PV (KITS) ×3 IMPLANT
SHEATH PINNACLE 5F 10CM (SHEATH) ×1 IMPLANT
SHEATH PINNACLE ST 6F 65CM (SHEATH) ×1 IMPLANT
SHEATH PROBE COVER 6X72 (BAG) ×1 IMPLANT
SYR MEDRAD MARK V 150ML (SYRINGE) ×1 IMPLANT
TRANSDUCER W/STOPCOCK (MISCELLANEOUS) ×3 IMPLANT
TRAY PV CATH (CUSTOM PROCEDURE TRAY) ×3 IMPLANT
WIRE BENTSON .035X145CM (WIRE) ×1 IMPLANT
WIRE G V18X300CM (WIRE) ×1 IMPLANT

## 2021-09-16 NOTE — Op Note (Signed)
Patient name: Christian [name] MRN: [id number] DOB: 04-12-1951 Sex: male  09/16/2021 Pre-operative Diagnosis: Left lower extremity Rutherford 5 critical limb ischemia Post-operative diagnosis:  Same Surgeon:  Victorino Sparrow, MD Procedure Performed: 1.  Ultrasound-guided micropuncture access of the right common femoral artery 2.  Aortogram 3.  Second order arterial cannulation, left lower extremity angiogram 4.  Right lower extremity angiogram 5.  Pro-glide assisted closure of the right common femoral artery 6.  57 minutes of moderate sedation  100 mL contrast   Indications:  70 y.o. male presenting with nonhealing foot wounds on the left third and fourth toes.  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  ABIs were independently evaluated demonstrated monophasic waveforms in the tibial vessels with poor waveforms and low pressures at the toes.  Current toe pressure is not high enough to heal the left foot wounds. Christian [name] would benefit from diagnostic angiography of bilateral lower extremities but has CKD3b. Therefore we will start with angiography of the left lower extremity with possible intervention in an effort to define and improve left lower extremity perfusion.  After discussing the risks and benefits, Christian [name] elected to proceed.   I discussed that not all peripheral arterial disease can be solved with the use of wires and catheters, but some people require operative intervention.  Christian [name] is a poor surgical candidate due to his physical deconditioning and walker-dependent ambulatory status.  I told his wife, that all attempts would be made to fix him from an endovascular perspective as open surgery would likely lead to a nonambulatory state.  Findings:   Aorta: Normal bilateral renal arteries, no stenosis, normal infrarenal abdominal aorta, large inferior mesenteric artery, normal terminal aorta  On the right: Normal common iliac  artery, normal internal iliac artery, normal external iliac artery, normal common femoral artery, normal profunda, mild atherosclerotic disease in the superficial femoral artery.  Normal popliteal artery, P1, P2 with distal near occlusion in P3.  Visualization of tibial disease is suboptimal due to retrograde sheath angiogram, however there is diffuse collateralization with no inline flow in the calf.  The anterior tibial artery and peroneal artery are not visualized.  There is reconstitution of the posterior tibial artery which travels to the foot, and there is a large collateral that appears to go to the dorsalis pedis as well.  On the left: Normal common iliac artery, normal internal iliac, normal external iliac, normal common femoral, normal profunda, mild atherosclerotic disease of the superficial femoral artery with no flow-limiting stenosis appreciated.  There is mild atherosclerosis in the popliteal artery with flush occlusion of the anterior tibial artery.  The tibioperoneal trunk appears to be intact however both posterior tibial and peroneal arteries occlude roughly 2 cm distally.  There are diffuse collaterals in the calf with reconstitution of the posterior tibial artery.  This along with several collaterals provide flow to the foot.    Procedure:    Patient was brought to the Cath Lab and laid in the supine position.  Monitored anesthesia was induced and the patient was prepped and draped in standard fashion.  The case began with some guided micropuncture access of the right common femoral artery.  Angiography followed-imaging findings are above.  The decision was made to attempt antegrade intervention.  Patient was heparinized and a 6 x 70 mm sheath was used to traverse the aortic bifurcation and parked in the left popliteal artery a series of wires and catheters in both 0.035 and  0.018 size were used however I could not traverse the 4 cm posterior tibial artery occlusion.  Being that the  posterior tibial artery was the only named vessel to the foot, I elected to stop, to discuss high risk retrograde intervention with Christian [name] versus bypass.  The patient was closed using a ProGlide device without issue.  I discussed the above findings with Christian [name], his ex-wife, and his daughter postoperatively.  I am concerned that open bypass surgery with his comorbidities and current walker dependent ambulatory status, would end in a nonambulatory state even if flow were restored to the foot.  I discussed high risk pedal access for complex limb salvage.  Christian [name] would like to think more about this, and elected to see me in clinic in 2 weeks.  He is aware it is unlikely his wounds on his feet will heal without intervention, however we will assess his wounds at that time and discuss the next steps.    Christian Olden, MD [facility] [place] Office: [phone]

## 2021-09-16 NOTE — H&P (Addendum)
Note   Patient was seen and examined in preoperative holding prior to left lower extremity angiogram. Kasen presents with Rutherford 5 critical limb ischemia He has had no changes in his physical exam or medications since seen in the office 2 days ago After discussing the risks and benefits of left lower extremity angiogram, with possible intervention, Ariv elected to proceed.  CC:  Nonhealing left foot wounds Requesting Provider:  No ref. provider found  HPI: Zyaire Mccleod Schoenbeck is a morbidly obese 70 y.o. ([birth year]) male presenting at the request of Ovid Curd, DPM after being evaluated in clinic for traumatic nail avulsion of the left third toe and ulceration on the left fourth toe.  The patient was found to have decreased pedal pulses bilaterally and therefore underwent ABI testing.  On exam, Jozef was accompanied by his wife and doing well.  He stated the wounds have been present for 2 months.  He denies erythema or induration.  Franciscojavier has difficulty with ambulation requiring a rolling walker and needs assistance moving from a chair to standing.  This appears to be due to his morbid obesity as well as deconditioning.  He denies rest pain, but has longstanding diabetes with peripheral neuropathy.   The pt is on a statin for cholesterol management.  The pt is not on a daily aspirin.   Other AC:  Xarelto, plavix.   The pt is  on medications for hypertension.   The pt is  diabetic.  Tobacco hx:  never  Past Medical History:  Diagnosis Date   Bilateral lower extremity edema: chronic with venous stasis changes 06/02/2014   Bipolar 1 disorder (HCC)    Bipolar disorder (HCC)    CHF (congestive heart failure) (HCC)    Chronic kidney disease    Gout    Gout    Hx of blood clots    Hypertension    Mood swings    OSA on CPAP 06/02/2014   Other and unspecified hyperlipidemia 06/02/2014   Type II or unspecified type diabetes mellitus with unspecified complication, uncontrolled  06/02/2014    Past Surgical History:  Procedure Laterality Date   LEFT HEART CATH AND CORONARY ANGIOGRAPHY Left 04/01/2020   Procedure: LEFT HEART CATH AND CORONARY ANGIOGRAPHY;  Surgeon: Laurier Nancy, MD;  Location: [facility] INVASIVE CV LAB;  Service: Cardiovascular;  Laterality: Left;    Social History   Socioeconomic History   Marital status: Divorced    Spouse name: Not on file   Number of children: Not on file   Years of education: Not on file   Highest education level: Not on file  Occupational History   Not on file  Tobacco Use   Smoking status: Never   Smokeless tobacco: Never  Substance and Sexual Activity   Alcohol use: No   Drug use: No   Sexual activity: Not Currently  Other Topics Concern   Not on file  Social History Narrative   *The patient is divorced and is currently on disability. He lives alone and has one daughter.             Social Determinants of Health   Financial Resource Strain: Not on file  Food Insecurity: Not on file  Transportation Needs: Not on file  Physical Activity: Not on file  Stress: Not on file  Social Connections: Not on file  Intimate Partner Violence: Not on file    Family History  Problem Relation Age of Onset   Dementia Mother    Arthritis  Father    Hypertension Father    Mental illness Sister    Diabetes Sister    Heart failure Neg Hx    Kidney failure Neg Hx    Cancer Neg Hx     Current Facility-Administered Medications  Medication Dose Route Frequency Provider Last Rate Last Admin   0.9 %  sodium chloride infusion   Intravenous Continuous Victorino Sparrow, MD       [START ON 09/17/2021] 0.9 %  sodium chloride infusion  250 mL Intravenous PRN Victorino Sparrow, MD       0.9% sodium chloride infusion  125 mL/hr Intravenous Continuous Victorino Sparrow, MD       acetaminophen (TYLENOL) tablet 650 mg  650 mg Oral Q4H PRN Victorino Sparrow, MD       fentaNYL (SUBLIMAZE) injection    PRN Victorino Sparrow, MD   50 mcg at  09/16/21 1548   Heparin (Porcine) in NaCl 1000-0.9 UT/500ML-% SOLN    PRN Victorino Sparrow, MD   500 mL at 09/16/21 1548   heparin sodium (porcine) injection    PRN Victorino Sparrow, MD   10,000 Units at 09/16/21 1622   hydrALAZINE (APRESOLINE) injection 5 mg  5 mg Intravenous Q20 Min PRN Victorino Sparrow, MD       iodixanol (VISIPAQUE) 320 MG/ML injection    PRN Victorino Sparrow, MD   100 mL at 09/16/21 1646   labetalol (NORMODYNE) injection 10 mg  10 mg Intravenous Q10 min PRN Victorino Sparrow, MD       midazolam (VERSED) injection    PRN Victorino Sparrow, MD   1 mg at 09/16/21 1548   ondansetron (ZOFRAN) injection 4 mg  4 mg Intravenous Q6H PRN Victorino Sparrow, MD       [START ON 09/17/2021] sodium chloride flush (NS) 0.9 % injection 3 mL  3 mL Intravenous Q12H Victorino Sparrow, MD       [START ON 09/17/2021] sodium chloride flush (NS) 0.9 % injection 3 mL  3 mL Intravenous PRN Victorino Sparrow, MD       Current Outpatient Medications  Medication Sig Dispense Refill   acetaminophen (TYLENOL) 500 MG tablet Take 500-1,000 mg by mouth every 6 (six) hours as needed (pain.).     ARIPiprazole (ABILIFY) 20 MG tablet Take 1 tablet (20 mg total) by mouth daily. 30 tablet 0   atorvastatin (LIPITOR) 10 MG tablet Take 1 tablet (10 mg total) by mouth daily at 6 PM. (Patient taking differently: Take 10 mg by mouth daily.) 30 tablet 0   buPROPion (WELLBUTRIN) 75 MG tablet Take 1 tablet (75 mg total) by mouth daily. 30 tablet 0   divalproex (DEPAKOTE) 500 MG DR tablet Take 1 tablet (500 mg total) by mouth every 12 (twelve) hours. 60 tablet 0   empagliflozin (JARDIANCE) 10 MG TABS tablet Take 10 mg by mouth daily.      furosemide (LASIX) 40 MG tablet Take 1 tablet (40 mg total) by mouth daily. (Patient taking differently: Take 40 mg by mouth 2 (two) times daily.) 30 tablet    glimepiride (AMARYL) 2 MG tablet Take 2 mg by mouth daily with breakfast.     insulin glargine (LANTUS) 100 UNIT/ML injection Inject  5-6 Units into the skin in the morning.     liothyronine (CYTOMEL) 5 MCG tablet Take 10 mcg by mouth daily.     metoprolol succinate (TOPROL-XL) 25 MG 24 hr tablet Take 2  tablets (50 mg total) by mouth daily. Take with or immediately following a meal.     mirtazapine (REMERON) 15 MG tablet Take 15 mg by mouth at bedtime.     mupirocin ointment (BACTROBAN) 2 % Apply 1 application topically 2 (two) times daily. (Patient taking differently: Apply 1 application topically daily. Applied to toe wound) 30 g 2   polyethylene glycol powder (GLYCOLAX/MIRALAX) 17 GM/SCOOP powder Take 17 g by mouth daily. 255 g 0   rivaroxaban (XARELTO) 20 MG TABS tablet Take 1 tablet (20 mg total) by mouth daily with supper. (Patient taking differently: Take 20 mg by mouth daily.) 30 tablet 0    Allergies  Allergen Reactions   Amlodipine Swelling and Other (See Comments)    Legs became swollen   Onglyza [Saxagliptin] Other (See Comments)    "Allergic," per [facility]   Zyprexa [Olanzapine] Other (See Comments)    Allergy noted by ACT Team. No reaction specified (??)- "Allergic," per [facility]   Ritalin [Methylphenidate] Anxiety    "[place]"     REVIEW OF SYSTEMS:   [X]  denotes positive finding, [ ]  denotes negative finding Cardiac  Comments:  Chest pain or chest pressure:    Shortness of breath upon exertion:    Short of breath when lying flat:    Irregular heart rhythm:        Vascular    Pain in calf, thigh, or hip brought on by ambulation:    Pain in feet at night that wakes you up from your sleep:     Blood clot in your veins:    Leg swelling:  X       Pulmonary    Oxygen at home:    Productive cough:     Wheezing:         Neurologic    Sudden weakness in arms or legs:     Sudden numbness in arms or legs:     Sudden onset of difficulty speaking or slurred speech:    Temporary loss of vision in one eye:     Problems with dizziness:         Gastrointestinal    Blood in  stool:     Vomited blood:         Genitourinary    Burning when urinating:     Blood in urine:        Psychiatric    Major depression:         Hematologic    Bleeding problems:    Problems with blood clotting too easily:        Skin    Rashes or ulcers:        Constitutional    Fever or chills:      PHYSICAL EXAMINATION:  Vitals:   09/16/21 1800 09/16/21 1810 09/16/21 1820 09/16/21 1835  BP:   (!) 86/59 118/67  Pulse: (!) 120 87 86   Resp:      Temp:      TempSrc:      SpO2: (!) 60% 95% 94%   Weight:      Height:        General:  WDWN in NAD; vital signs documented above, obese Gait: Not observed HENT: WNL, normocephalic Pulmonary: normal non-labored breathing , without Rales, rhonchi,  wheezing Cardiac: regular HR,  Abdomen: soft, NT, no masses Skin: without rashes Vascular Exam/Pulses:  Right Left  Radial 2+ (normal) 2+ (normal)  Ulnar 2+ (normal) 2+ (normal)  Femoral  2+ 2+  Popliteal    DP absent absent  PT absent absent   Extremities: without ischemic changes, without Gangrene , without cellulitis; with open wounds; left third and fourth toe, right second toe. Musculoskeletal: no muscle wasting or atrophy  Neurologic: A&O X 3;  No focal weakness or paresthesias are detected Psychiatric:  The pt has Normal affect.   Non-Invasive Vascular Imaging:       ASSESSMENT/PLAN:: 70 y.o. male presenting with nonhealing foot wounds on the left third and fourth toes.   Zamauri's peripheral arterial disease is classified as Rutherford 5 critical limb ischemia.   ABIs were independently evaluated demonstrated monophasic waveforms in the tibial vessels with poor waveforms and low pressures at the toes.  Current toe pressure is not high enough to heal the left foot wounds.  Vern would benefit from diagnostic angiography of bilateral lower extremities but has CKD3b. Therefore we will start with angiography of the left lower extremity with possible intervention  in an effort to define and improve left lower extremity perfusion.  After discussing the risks and benefits, Shoichi elected to proceed.    I discussed that not all peripheral arterial disease can be solved with the use of wires and catheters, but some people require operative intervention.  Doyle is a poor surgical candidate due to his physical deconditioning and walker-dependent ambulatory status.  I told his wife, that all attempts would be made to fix him from an endovascular perspective as open surgery would likely lead to a nonambulatory state.  Giovanie has a history of blood clots however his most recent bilateral lower extremity DVT ultrasound (2021) was negative for thrombus.  I asked him to hold his Xarelto for the next 2 days prior to intervention.   Victorino Sparrow, MD [facility] [phone]

## 2021-09-17 ENCOUNTER — Encounter (HOSPITAL_COMMUNITY): Payer: Self-pay | Admitting: Vascular Surgery

## 2021-09-18 MED FILL — Lidocaine HCl Local Preservative Free (PF) Inj 1%: INTRAMUSCULAR | Qty: 30 | Status: AC

## 2021-09-22 ENCOUNTER — Encounter: Payer: Self-pay | Admitting: Podiatry

## 2021-09-22 ENCOUNTER — Other Ambulatory Visit: Payer: Self-pay

## 2021-09-22 ENCOUNTER — Ambulatory Visit: Payer: Medicare PPO | Admitting: Podiatry

## 2021-09-22 DIAGNOSIS — Z7901 Long term (current) use of anticoagulants: Secondary | ICD-10-CM

## 2021-09-22 DIAGNOSIS — E1149 Type 2 diabetes mellitus with other diabetic neurological complication: Secondary | ICD-10-CM

## 2021-09-22 DIAGNOSIS — L97511 Non-pressure chronic ulcer of other part of right foot limited to breakdown of skin: Secondary | ICD-10-CM | POA: Diagnosis not present

## 2021-09-22 DIAGNOSIS — L97521 Non-pressure chronic ulcer of other part of left foot limited to breakdown of skin: Secondary | ICD-10-CM

## 2021-09-22 DIAGNOSIS — M79674 Pain in right toe(s): Secondary | ICD-10-CM

## 2021-09-22 DIAGNOSIS — I739 Peripheral vascular disease, unspecified: Secondary | ICD-10-CM | POA: Diagnosis not present

## 2021-09-22 DIAGNOSIS — B351 Tinea unguium: Secondary | ICD-10-CM

## 2021-09-24 DIAGNOSIS — I340 Nonrheumatic mitral (valve) insufficiency: Secondary | ICD-10-CM | POA: Diagnosis not present

## 2021-09-25 NOTE — Progress Notes (Signed)
Subjective: 70 year old male presents the office today with his wife for follow-up evaluation after having traumatic nail avulsion of the left third toe, ulcerations.  Also asking for the nails to be trimmed today as are thickened elongated causing discomfort and her brittle.  No swelling or redness to the toenail sites.  He has no other concerns today.  He has had angio performed with vascular surgery has a follow-up scheduled as well.  Objective: AAO x3, NAD DP/PT pulses decreased bilaterally On the third digit toenail area has scabbed over small onycholysis present but there is no drainage or pus.  On the left fourth toe is a flat what appears to be old hemorrhagic blister which is starting to dry.  There is no drainage present within this area.  On the right fourth digit just proximal to the nail fold is a small area of necrosis.  There is no drainage or pus or any fluctuation crepitation.  There is no malodor. The nails appear to be hypertrophic, dystrophic with yellow-brown discoloration x10.  No pain with calf compression, swelling, warmth, erythema      Assessment:  Bilateral ulcerations, PAD, symptomatic onychomycosis on Xarelto  Plan: -All treatment options discussed with the patient including all alternatives, risks, complications.  -Sharply debrided the nails x10 without any complications or bleeding. -Continue to keep the areas clean and dry on the toes.  Offloading to help prevent further breakdown. Daily foot inspection discussed.  Monitor for any signs or symptoms of infection without any were to occur he is to report to the emergency room.  Follow-up with vascular surgery as scheduled.  Vivi Barrack DPM

## 2021-09-28 NOTE — Progress Notes (Signed)
Office Note     CC:  Nonhealing left foot wounds Requesting Provider:  Donald Prose, MD  HPI: Christian [name] is a morbidly obese 70 y.o. 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  The patient was initially seen after nail avulsion of the left third toe and ulceration on the left fourth toe.  On angiography, the patient did not have inline flow to the foot however the posterior tibial artery reconstituted distally.  I could not traverse the posterior tibial artery occlusion in antegrade fashion.  After the case, Christian [name] and I discussed his imaging and my concern that with distal bypass his deconditioning / walker-dependent ambulatory status would only worsen.  I discussed high risk endovascular intervention, retrograde posterior tibial artery approach.  Christian [name] asked to think about his options further and see me in clinic to assess his wounds prior to pursuing intervention.  Christian [name] presents today accompanied by his ex-wife.  He is doing well.  He feels as though the wounds are healing slowly.  Christian [name] appears to be doing better, much more alert, interactive.  He continues to use a walker with moderate difficulty ambulating. He denies rest pain, but has longstanding diabetes with peripheral neuropathy.   The pt is on a statin for cholesterol management.  The pt is not on a daily aspirin.   Other AC:  Xarelto   The pt is  on medications for hypertension.   The pt is  diabetic.  Tobacco hx:  never  Past Medical History:  Diagnosis Date   Bilateral lower extremity edema: chronic with venous stasis changes 06/02/2014   Bipolar 1 disorder (HCC)    Bipolar disorder (HCC)    CHF (congestive heart failure) ([place])    Chronic kidney disease    Gout    Gout    Hx of blood clots    Hypertension    Mood swings    OSA on CPAP 06/02/2014   Other and unspecified hyperlipidemia 06/02/2014   Type II or unspecified  type diabetes mellitus with unspecified complication, uncontrolled 06/02/2014    Past Surgical History:  Procedure Laterality Date   ABDOMINAL AORTOGRAM W/LOWER EXTREMITY N/A 09/16/2021   Procedure: ABDOMINAL AORTOGRAM W/LOWER EXTREMITY;  Surgeon: Christian John, MD;  Location: [facility];  Service: Cardiovascular;  Laterality: N/A;   LEFT HEART CATH AND CORONARY ANGIOGRAPHY Left 04/01/2020   Procedure: LEFT HEART CATH AND CORONARY ANGIOGRAPHY;  Surgeon: Dionisio David, MD;  Location: [facility];  Service: Cardiovascular;  Laterality: Left;   PERIPHERAL VASCULAR BALLOON ANGIOPLASTY Left 09/16/2021   Procedure: PERIPHERAL VASCULAR BALLOON ANGIOPLASTY;  Surgeon: Christian John, MD;  Location: [facility];  Service: Cardiovascular;  Laterality: Left;  attempted to PTA posterior tibial artery; unable to cross lesion    Social History   Socioeconomic History   Marital status: Divorced    Spouse name: Not on file   Number of children: Not on file   Years of education: Not on file   Highest education level: Not on file  Occupational History   Not on file  Tobacco Use   Smoking status: Never   Smokeless tobacco: Never  Substance and Sexual Activity   Alcohol use: No   Drug use: No   Sexual activity: Not Currently  Other Topics Concern   Not on file  Social History Narrative   *The patient is divorced and is currently on disability.  He lives alone and has one daughter.             Social Determinants of Health   Financial Resource Strain: Not on file  Food Insecurity: Not on file  Transportation Needs: Not on file  Physical Activity: Not on file  Stress: Not on file  Social Connections: Not on file  Intimate Partner Violence: Not on file    Family History  Problem Relation Age of Onset   Dementia Mother    Arthritis Father    Hypertension Father    Mental illness Sister    Diabetes Sister    Heart failure Neg Hx    Kidney failure Neg Hx     Cancer Neg Hx     Current Outpatient Medications  Medication Sig Dispense Refill   acetaminophen (TYLENOL) 500 MG tablet Take 500-1,000 mg by mouth every 6 (six) hours as needed (pain.).     ARIPiprazole (ABILIFY) 20 MG tablet Take 1 tablet (20 mg total) by mouth daily. 30 tablet 0   atorvastatin (LIPITOR) 10 MG tablet Take 1 tablet (10 mg total) by mouth daily at 6 PM. (Patient taking differently: Take 10 mg by mouth daily.) 30 tablet 0   buPROPion (WELLBUTRIN) 75 MG tablet Take 1 tablet (75 mg total) by mouth daily. 30 tablet 0   divalproex (DEPAKOTE) 500 MG DR tablet Take 1 tablet (500 mg total) by mouth every 12 (twelve) hours. 60 tablet 0   empagliflozin (JARDIANCE) 10 MG TABS tablet Take 10 mg by mouth daily.      furosemide (LASIX) 40 MG tablet Take 1 tablet (40 mg total) by mouth daily. (Patient taking differently: Take 40 mg by mouth 2 (two) times daily.) 30 tablet    glimepiride (AMARYL) 2 MG tablet Take 2 mg by mouth daily with breakfast.     insulin glargine (LANTUS) 100 UNIT/ML injection Inject 5-6 Units into the skin in the morning.     liothyronine (CYTOMEL) 5 MCG tablet Take 10 mcg by mouth daily.     metoprolol succinate (TOPROL-XL) 25 MG 24 hr tablet Take 2 tablets (50 mg total) by mouth daily. Take with or immediately following a meal.     mirtazapine (REMERON) 15 MG tablet Take 15 mg by mouth at bedtime.     mupirocin ointment (BACTROBAN) 2 % Apply 1 application topically 2 (two) times daily. (Patient taking differently: Apply 1 application topically daily. Applied to toe wound) 30 g 2   polyethylene glycol powder (GLYCOLAX/MIRALAX) 17 GM/SCOOP powder Take 17 g by mouth daily. 255 g 0   rivaroxaban (XARELTO) 20 MG TABS tablet Take 1 tablet (20 mg total) by mouth daily with supper. (Patient taking differently: Take 20 mg by mouth daily.) 30 tablet 0   No current facility-administered medications for this visit.    Allergies  Allergen Reactions   Amlodipine Swelling and  Other (See Comments)    Legs became swollen   Onglyza [Saxagliptin] Other (See Comments)    "Allergic," per [facility]   Zyprexa [Olanzapine] Other (See Comments)    Allergy noted by ACT Team. No reaction specified (??)- "Allergic," per [facility]   Ritalin [Methylphenidate] Anxiety    "Maniac"     REVIEW OF SYSTEMS:   [X]  denotes positive finding, [ ]  denotes negative finding Cardiac  Comments:  Chest pain or chest pressure:    Shortness of breath upon exertion:    Short of breath when lying flat:    Irregular heart rhythm:  Vascular    Pain in calf, thigh, or hip brought on by ambulation:    Pain in feet at night that wakes you up from your sleep:     Blood clot in your veins:    Leg swelling:  X       Pulmonary    Oxygen at home:    Productive cough:     Wheezing:         Neurologic    Sudden weakness in arms or legs:     Sudden numbness in arms or legs:     Sudden onset of difficulty speaking or slurred speech:    Temporary loss of vision in one eye:     Problems with dizziness:         Gastrointestinal    Blood in stool:     Vomited blood:         Genitourinary    Burning when urinating:     Blood in urine:        Psychiatric    Major depression:         Hematologic    Bleeding problems:    Problems with blood clotting too easily:        Skin    Rashes or ulcers:        Constitutional    Fever or chills:      PHYSICAL EXAMINATION:  There were no vitals filed for this visit.  General:  WDWN in NAD; vital signs documented above, obese Gait: Not observed HENT: WNL, normocephalic Pulmonary: normal non-labored breathing , without Rales, rhonchi,  wheezing Cardiac: regular HR,  Abdomen: soft, NT, no masses Skin: without rashes Vascular Exam/Pulses:  Right Left  Radial 2+ (normal) 2+ (normal)  Ulnar 2+ (normal) 2+ (normal)  Femoral 2+ 2+  Popliteal    DP absent absent  PT absent absent   Extremities: without  ischemic changes, without Gangrene , without cellulitis; with open wounds; left third and fourth toe, right second toe, healed, fourth toe with healing wound.   Musculoskeletal: no muscle wasting or atrophy  Neurologic: A&O X 3;  No focal weakness or paresthesias are detected Psychiatric:  The pt has Normal affect.   Non-Invasive Vascular Imaging:       ASSESSMENT/PLAN:: 70 y.o. male presenting with known Rutherford 5 critical limb ischemia.  He is status post diagnostic angiogram, with no inline flow to the foot.  Caziah was offered high risk, retrograde posterior tibial access with retrograde intervention on the right, and standard antegrade intervention on the left leg should the wounds stop healing.  We both agreed to hold off at this time as his feet are healing.The right second toe has healed, and the left third toe has healed.  The left 4th toe is healing slowly and he has a new lesion on the right fourth toe that appears to be superficial, and healing.  I will see Layten in 1 month to assess his wounds.  Both he and his ex-wife were asked to call our office immediately should these wounds progress or healing become stagnant.   On exam, Masun was noted to have significant bilateral calf swelling.  I asked him to call his PCP, Dr. Wynelle Link, to evaluate possible medication changes to address fluid overload. I asked that he not wear compression stockings due to his known tibial disease Michall recently ran out of aspirin, but will be picking up another bottle and continuing it today.    Victorino Sparrow, MD [facility] [phone]

## 2021-10-02 ENCOUNTER — Other Ambulatory Visit: Payer: Self-pay

## 2021-10-02 ENCOUNTER — Ambulatory Visit (INDEPENDENT_AMBULATORY_CARE_PROVIDER_SITE_OTHER): Payer: Medicare PPO | Admitting: Vascular Surgery

## 2021-10-02 ENCOUNTER — Telehealth: Payer: Self-pay

## 2021-10-02 ENCOUNTER — Encounter: Payer: Self-pay | Admitting: Vascular Surgery

## 2021-10-02 VITALS — BP 123/81 | HR 80 | Temp 98.3°F | Resp 20 | Ht 72.0 in | Wt 277.0 lb

## 2021-10-02 DIAGNOSIS — I70223 Atherosclerosis of native arteries of extremities with rest pain, bilateral legs: Secondary | ICD-10-CM | POA: Diagnosis not present

## 2021-10-02 DIAGNOSIS — I739 Peripheral vascular disease, unspecified: Secondary | ICD-10-CM | POA: Diagnosis not present

## 2021-10-02 NOTE — Telephone Encounter (Signed)
Patient's wife calls today after appt to confirm that patient is supposed to take aspirin and xarelto together. Per MD, this is correct. She verbalized understanding.

## 2021-10-13 ENCOUNTER — Other Ambulatory Visit: Payer: Self-pay

## 2021-10-13 ENCOUNTER — Ambulatory Visit: Payer: Medicare PPO | Admitting: Podiatry

## 2021-10-13 DIAGNOSIS — I739 Peripheral vascular disease, unspecified: Secondary | ICD-10-CM

## 2021-10-13 DIAGNOSIS — L97521 Non-pressure chronic ulcer of other part of left foot limited to breakdown of skin: Secondary | ICD-10-CM

## 2021-10-13 DIAGNOSIS — L97511 Non-pressure chronic ulcer of other part of right foot limited to breakdown of skin: Secondary | ICD-10-CM

## 2021-10-20 NOTE — Progress Notes (Signed)
Subjective: 70 year old male presents the office today with his wife for follow-up evaluation after having traumatic nail avulsion of the left third toe resulting ulceration as well as an area on the right third digit.  He has been followed up with vascular surgery as well.  Denies any swelling redness or any drainage.  No other concerns today.   Objective: AAO x3, NAD DP/PT pulses decreased bilaterally On the third digit nail plate as well as the right fourth toe there are small scabs present, as showers.  There is no drainage or pus.  No fluctuance or crepitation but there is no malodor. No pain with calf compression, swelling, warmth, erythema   Assessment:  Bilateral ulcerations, PAD  Plan: -All treatment options discussed with the patient including all alternatives, risks, complications.  -Discharge, wounds appear to be stable.  No signs of infection but needs to monitor very closely for this.  Continue follow-up with vascular surgery.  Continue offloading.  Return in about 4 weeks (around 11/10/2021).  Vivi Barrack DPM

## 2021-10-29 ENCOUNTER — Other Ambulatory Visit: Payer: Self-pay | Admitting: Podiatry

## 2021-11-02 DIAGNOSIS — Z794 Long term (current) use of insulin: Secondary | ICD-10-CM | POA: Diagnosis not present

## 2021-11-02 DIAGNOSIS — Z0000 Encounter for general adult medical examination without abnormal findings: Secondary | ICD-10-CM | POA: Diagnosis not present

## 2021-11-02 DIAGNOSIS — E1122 Type 2 diabetes mellitus with diabetic chronic kidney disease: Secondary | ICD-10-CM | POA: Diagnosis not present

## 2021-11-02 DIAGNOSIS — I700 Atherosclerosis of aorta: Secondary | ICD-10-CM | POA: Diagnosis not present

## 2021-11-02 DIAGNOSIS — G4733 Obstructive sleep apnea (adult) (pediatric): Secondary | ICD-10-CM | POA: Diagnosis not present

## 2021-11-02 DIAGNOSIS — I110 Hypertensive heart disease with heart failure: Secondary | ICD-10-CM | POA: Diagnosis not present

## 2021-11-02 DIAGNOSIS — Z23 Encounter for immunization: Secondary | ICD-10-CM | POA: Diagnosis not present

## 2021-11-02 DIAGNOSIS — N1831 Chronic kidney disease, stage 3a: Secondary | ICD-10-CM | POA: Diagnosis not present

## 2021-11-02 DIAGNOSIS — R600 Localized edema: Secondary | ICD-10-CM | POA: Diagnosis not present

## 2021-11-02 DIAGNOSIS — I509 Heart failure, unspecified: Secondary | ICD-10-CM | POA: Diagnosis not present

## 2021-11-06 ENCOUNTER — Ambulatory Visit (INDEPENDENT_AMBULATORY_CARE_PROVIDER_SITE_OTHER): Payer: Medicare PPO | Admitting: Vascular Surgery

## 2021-11-06 ENCOUNTER — Other Ambulatory Visit: Payer: Self-pay

## 2021-11-06 VITALS — BP 111/77 | HR 88 | Temp 98.7°F | Resp 20 | Ht 72.0 in | Wt 277.0 lb

## 2021-11-06 DIAGNOSIS — I70223 Atherosclerosis of native arteries of extremities with rest pain, bilateral legs: Secondary | ICD-10-CM

## 2021-11-06 NOTE — Progress Notes (Signed)
Office Note     CC:  Nonhealing left foot wounds Requesting Provider:  [name], MD  HPI: Christian [name] is a morbidly obese 70 y.o. ([birth year]) male presenting in follow-up status post 09/16/2021 diagnostic angiogram for Rutherford 5 critical limb ischemia.  The patient was initially seen after nail avulsion of the left third toe and ulceration on the left fourth toe.  On angiography, the patient did not have inline flow to the foot however the posterior tibial artery reconstituted distally.  I could not traverse the posterior tibial artery occlusion in antegrade fashion.  After the case, Leonette Most and I discussed his imaging and my concern that with distal bypass his deconditioning / walker-dependent ambulatory status would only worsen.  I discussed high risk endovascular intervention, retrograde posterior tibial artery approach.    Since that time, I have been following Riel's wounds in clinic.  At his last visit, Ermine and his ex-wife felt his wounds were slowly improving.  We decided to table further discussions of angiography.  He presents today for 1 month follow-up.  Overall he feels like he is doing well, with no major issues to either of his feet.  He feels as though his wounds continue to heal slowly.  He continues to ambulate with a walker with moderate difficulty.  He denies rest pain but has longstanding diabetes with peripheral neuropathy.  The pt is on a statin for cholesterol management.  The pt is not on a daily aspirin.   Other AC:  Xarelto The pt is  on medications for hypertension.   The pt is  diabetic.  Tobacco hx:  never  Past Medical History:  Diagnosis Date   Bilateral lower extremity edema: chronic with venous stasis changes 06/02/2014   Bipolar 1 disorder (HCC)    Bipolar disorder (HCC)    CHF (congestive heart failure) (HCC)    Chronic kidney disease    Gout    Gout    Hx of blood clots    Hypertension    Mood swings    OSA on CPAP 06/02/2014    Other and unspecified hyperlipidemia 06/02/2014   Type II or unspecified type diabetes mellitus with unspecified complication, uncontrolled 06/02/2014    Past Surgical History:  Procedure Laterality Date   ABDOMINAL AORTOGRAM W/LOWER EXTREMITY N/A 09/16/2021   Procedure: ABDOMINAL AORTOGRAM W/LOWER EXTREMITY;  Surgeon: Victorino Sparrow, MD;  Location: [facility];  Service: Cardiovascular;  Laterality: N/A;   LEFT HEART CATH AND CORONARY ANGIOGRAPHY Left 04/01/2020   Procedure: LEFT HEART CATH AND CORONARY ANGIOGRAPHY;  Surgeon: Laurier Nancy, MD;  Location: [facility]AS[facility];  Service: Cardiovascular;  Laterality: Left;   PERIPHERAL VASCULAR BALLOON ANGIOPLASTY Left 09/16/2021   Procedure: PERIPHERAL VASCULAR BALLOON ANGIOPLASTY;  Surgeon: Victorino Sparrow, MD;  Location: [facility]AS[facility];  Service: Cardiovascular;  Laterality: Left;  attempted to PTA posterior tibial artery; unable to cross lesion    Social History   Socioeconomic History   Marital status: Divorced    Spouse name: Not on file   Number of children: Not on file   Years of education: Not on file   Highest education level: Not on file  Occupational History   Not on file  Tobacco Use   Smoking status: Never   Smokeless tobacco: Never  Substance and Sexual Activity   Alcohol use: No   Drug use: No   Sexual activity: Not Currently  Other Topics Concern   Not  on file  Social History Narrative   *The patient is divorced and is currently on disability. He lives alone and has one daughter.             Social Determinants of Health   Financial Resource Strain: Not on file  Food Insecurity: Not on file  Transportation Needs: Not on file  Physical Activity: Not on file  Stress: Not on file  Social Connections: Not on file  Intimate Partner Violence: Not on file    Family History  Problem Relation Age of Onset   Dementia Mother    Arthritis Father    Hypertension Father    Mental illness Sister     Diabetes Sister    Heart failure Neg Hx    Kidney failure Neg Hx    Cancer Neg Hx     Current Outpatient Medications  Medication Sig Dispense Refill   acetaminophen (TYLENOL) 500 MG tablet Take 500-1,000 mg by mouth every 6 (six) hours as needed (pain.).     ARIPiprazole (ABILIFY) 20 MG tablet Take 1 tablet (20 mg total) by mouth daily. 30 tablet 0   atorvastatin (LIPITOR) 10 MG tablet Take 1 tablet (10 mg total) by mouth daily at 6 PM. (Patient taking differently: Take 10 mg by mouth daily.) 30 tablet 0   buPROPion (WELLBUTRIN) 75 MG tablet Take 1 tablet (75 mg total) by mouth daily. 30 tablet 0   divalproex (DEPAKOTE) 500 MG DR tablet Take 1 tablet (500 mg total) by mouth every 12 (twelve) hours. 60 tablet 0   empagliflozin (JARDIANCE) 10 MG TABS tablet Take 10 mg by mouth daily.      furosemide (LASIX) 40 MG tablet Take 1 tablet (40 mg total) by mouth daily. (Patient taking differently: Take 40 mg by mouth 2 (two) times daily.) 30 tablet    glimepiride (AMARYL) 2 MG tablet Take 2 mg by mouth daily with breakfast.     insulin glargine (LANTUS) 100 UNIT/ML injection Inject 5-6 Units into the skin in the morning.     liothyronine (CYTOMEL) 5 MCG tablet Take 10 mcg by mouth daily.     metoprolol succinate (TOPROL-XL) 25 MG 24 hr tablet Take 2 tablets (50 mg total) by mouth daily. Take with or immediately following a meal.     mirtazapine (REMERON) 15 MG tablet Take 15 mg by mouth at bedtime.     mupirocin ointment (BACTROBAN) 2 % Apply 1 application topically daily. Applied to toe wound 22 g 1   polyethylene glycol powder (GLYCOLAX/MIRALAX) 17 GM/SCOOP powder Take 17 g by mouth daily. 255 g 0   rivaroxaban (XARELTO) 20 MG TABS tablet Take 1 tablet (20 mg total) by mouth daily with supper. (Patient taking differently: Take 20 mg by mouth daily.) 30 tablet 0   No current facility-administered medications for this visit.    Allergies  Allergen Reactions   Amlodipine Swelling and Other (See  Comments)    Legs became swollen   Onglyza [Saxagliptin] Other (See Comments)    "Allergic," per [facility]   Zyprexa [Olanzapine] Other (See Comments)    Allergy noted by ACT Team. No reaction specified (??)- "Allergic," per [facility]   Ritalin [Methylphenidate] Anxiety    "Maniac"     REVIEW OF SYSTEMS:   [X]  denotes positive finding, [ ]  denotes negative finding Cardiac  Comments:  Chest pain or chest pressure:    Shortness of breath upon exertion:    Short of breath when lying flat:  Irregular heart rhythm:        Vascular    Pain in calf, thigh, or hip brought on by ambulation:    Pain in feet at night that wakes you up from your sleep:     Blood clot in your veins:    Leg swelling:  X       Pulmonary    Oxygen at home:    Productive cough:     Wheezing:         Neurologic    Sudden weakness in arms or legs:     Sudden numbness in arms or legs:     Sudden onset of difficulty speaking or slurred speech:    Temporary loss of vision in one eye:     Problems with dizziness:         Gastrointestinal    Blood in stool:     Vomited blood:         Genitourinary    Burning when urinating:     Blood in urine:        Psychiatric    Major depression:         Hematologic    Bleeding problems:    Problems with blood clotting too easily:        Skin    Rashes or ulcers:        Constitutional    Fever or chills:      PHYSICAL EXAMINATION:  Vitals:   11/06/21 1018  BP: 111/77  Pulse: 88  Resp: 20  Temp: 98.7 F (37.1 C)  SpO2: 93%  Weight: 277 lb (125.6 kg)  Height: 6' (1.829 m)    General:  WDWN in NAD; vital signs documented above, obese Gait: Not observed HENT: WNL, normocephalic Pulmonary: normal non-labored breathing , without Rales, rhonchi,  wheezing Cardiac: regular HR,  Abdomen: soft, NT, no masses Skin: without rashes Vascular Exam/Pulses:  Right Left  Radial 2+ (normal) 2+ (normal)  Ulnar 2+ (normal) 2+ (normal)   Femoral 2+ 2+  Popliteal    DP absent absent  PT absent absent   Extremities: without ischemic changes, without Gangrene , without cellulitis; with open wounds - see below       Musculoskeletal: no muscle wasting or atrophy  Neurologic: A&O X 3;  No focal weakness or paresthesias are detected Psychiatric:  The pt has Normal affect.   Non-Invasive Vascular Imaging:       ASSESSMENT/PLAN:: 70 y.o. male presenting with known Rutherford 5 critical limb ischemia.  He is status post diagnostic angiogram, with no inline flow to the foot.  Dadrian was offered high risk, retrograde posterior tibial access with retrograde intervention on the right, and standard antegrade intervention on the left leg should the wounds stop healing.   I looked at previous images, and it appears as though his wounds have become stagnant.  I recommended angiography in an effort to improve his blood flow, however Coben feels as though they are healing slowly.  At this point, we will continue to medically manage his wounds. These are being followed by both myself and Dr. Earleen [place], whom Huriel sees next week.  I told Hillary I would be happy to see him at any point should he feel as though his wounds are worsening, and would offer lower extremity angiogram.  At this time I will defer foot management to Dr. Jacqualyn Posey.  Kalynn was asked to call my office should he choose to pursue angiography for Rutherford 5 critical limb ischemia.  I asked Mats if he would like follow-up in three months or if he would like to call the office should his wounds progress.  Nashawn elected to call the office   Broadus John, MD [facility] [phone]

## 2021-11-10 ENCOUNTER — Ambulatory Visit: Payer: Medicare PPO | Admitting: Podiatry

## 2021-11-10 DIAGNOSIS — Z1211 Encounter for screening for malignant neoplasm of colon: Secondary | ICD-10-CM | POA: Diagnosis not present

## 2021-12-02 DIAGNOSIS — R809 Proteinuria, unspecified: Secondary | ICD-10-CM | POA: Diagnosis not present

## 2021-12-02 DIAGNOSIS — N183 Chronic kidney disease, stage 3 unspecified: Secondary | ICD-10-CM | POA: Diagnosis not present

## 2021-12-09 DIAGNOSIS — R809 Proteinuria, unspecified: Secondary | ICD-10-CM | POA: Diagnosis not present

## 2021-12-09 DIAGNOSIS — I959 Hypotension, unspecified: Secondary | ICD-10-CM | POA: Diagnosis not present

## 2021-12-09 DIAGNOSIS — Z794 Long term (current) use of insulin: Secondary | ICD-10-CM | POA: Diagnosis not present

## 2021-12-09 DIAGNOSIS — N183 Chronic kidney disease, stage 3 unspecified: Secondary | ICD-10-CM | POA: Diagnosis not present

## 2021-12-09 DIAGNOSIS — E1129 Type 2 diabetes mellitus with other diabetic kidney complication: Secondary | ICD-10-CM | POA: Diagnosis not present

## 2021-12-09 DIAGNOSIS — I2510 Atherosclerotic heart disease of native coronary artery without angina pectoris: Secondary | ICD-10-CM | POA: Diagnosis not present

## 2021-12-09 DIAGNOSIS — E559 Vitamin D deficiency, unspecified: Secondary | ICD-10-CM | POA: Diagnosis not present

## 2021-12-09 DIAGNOSIS — R600 Localized edema: Secondary | ICD-10-CM | POA: Diagnosis not present

## 2021-12-09 DIAGNOSIS — D649 Anemia, unspecified: Secondary | ICD-10-CM | POA: Diagnosis not present

## 2021-12-14 DIAGNOSIS — I340 Nonrheumatic mitral (valve) insufficiency: Secondary | ICD-10-CM | POA: Diagnosis not present

## 2021-12-14 DIAGNOSIS — I509 Heart failure, unspecified: Secondary | ICD-10-CM | POA: Diagnosis not present

## 2021-12-14 DIAGNOSIS — E119 Type 2 diabetes mellitus without complications: Secondary | ICD-10-CM | POA: Diagnosis not present

## 2021-12-14 DIAGNOSIS — I10 Essential (primary) hypertension: Secondary | ICD-10-CM | POA: Diagnosis not present

## 2021-12-14 DIAGNOSIS — R609 Edema, unspecified: Secondary | ICD-10-CM | POA: Diagnosis not present

## 2021-12-14 DIAGNOSIS — I2510 Atherosclerotic heart disease of native coronary artery without angina pectoris: Secondary | ICD-10-CM | POA: Diagnosis not present

## 2021-12-14 DIAGNOSIS — E782 Mixed hyperlipidemia: Secondary | ICD-10-CM | POA: Diagnosis not present

## 2021-12-14 DIAGNOSIS — G4730 Sleep apnea, unspecified: Secondary | ICD-10-CM | POA: Diagnosis not present

## 2021-12-14 DIAGNOSIS — E669 Obesity, unspecified: Secondary | ICD-10-CM | POA: Diagnosis not present

## 2021-12-16 ENCOUNTER — Other Ambulatory Visit: Payer: Self-pay | Admitting: Podiatry

## 2021-12-28 DIAGNOSIS — I2510 Atherosclerotic heart disease of native coronary artery without angina pectoris: Secondary | ICD-10-CM | POA: Diagnosis not present

## 2022-01-11 DIAGNOSIS — I10 Essential (primary) hypertension: Secondary | ICD-10-CM | POA: Diagnosis not present

## 2022-01-11 DIAGNOSIS — E669 Obesity, unspecified: Secondary | ICD-10-CM | POA: Diagnosis not present

## 2022-01-11 DIAGNOSIS — E782 Mixed hyperlipidemia: Secondary | ICD-10-CM | POA: Diagnosis not present

## 2022-01-11 DIAGNOSIS — G4730 Sleep apnea, unspecified: Secondary | ICD-10-CM | POA: Diagnosis not present

## 2022-01-11 DIAGNOSIS — I340 Nonrheumatic mitral (valve) insufficiency: Secondary | ICD-10-CM | POA: Diagnosis not present

## 2022-01-11 DIAGNOSIS — I2510 Atherosclerotic heart disease of native coronary artery without angina pectoris: Secondary | ICD-10-CM | POA: Diagnosis not present

## 2022-01-11 DIAGNOSIS — I509 Heart failure, unspecified: Secondary | ICD-10-CM | POA: Diagnosis not present

## 2022-01-26 ENCOUNTER — Inpatient Hospital Stay (HOSPITAL_COMMUNITY): Payer: Medicare PPO

## 2022-01-26 ENCOUNTER — Ambulatory Visit: Payer: Medicare PPO

## 2022-01-26 ENCOUNTER — Observation Stay (HOSPITAL_COMMUNITY)
Admission: EM | Admit: 2022-01-26 | Discharge: 2022-01-29 | Disposition: A | Discharge status: Skilled Nursing Facility | Payer: Medicare PPO | Attending: Internal Medicine | Admitting: Internal Medicine

## 2022-01-26 ENCOUNTER — Ambulatory Visit (INDEPENDENT_AMBULATORY_CARE_PROVIDER_SITE_OTHER): Payer: Medicare PPO | Admitting: Podiatry

## 2022-01-26 ENCOUNTER — Emergency Department (HOSPITAL_COMMUNITY): Payer: Medicare PPO

## 2022-01-26 ENCOUNTER — Other Ambulatory Visit: Payer: Self-pay

## 2022-01-26 ENCOUNTER — Ambulatory Visit (INDEPENDENT_AMBULATORY_CARE_PROVIDER_SITE_OTHER): Payer: Medicare PPO

## 2022-01-26 DIAGNOSIS — L97521 Non-pressure chronic ulcer of other part of left foot limited to breakdown of skin: Secondary | ICD-10-CM

## 2022-01-26 DIAGNOSIS — L97309 Non-pressure chronic ulcer of unspecified ankle with unspecified severity: Secondary | ICD-10-CM

## 2022-01-26 DIAGNOSIS — I130 Hypertensive heart and chronic kidney disease with heart failure and stage 1 through stage 4 chronic kidney disease, or unspecified chronic kidney disease: Secondary | ICD-10-CM | POA: Insufficient documentation

## 2022-01-26 DIAGNOSIS — I2510 Atherosclerotic heart disease of native coronary artery without angina pectoris: Secondary | ICD-10-CM | POA: Diagnosis not present

## 2022-01-26 DIAGNOSIS — E11628 Type 2 diabetes mellitus with other skin complications: Secondary | ICD-10-CM

## 2022-01-26 DIAGNOSIS — T148XXA Other injury of unspecified body region, initial encounter: Secondary | ICD-10-CM

## 2022-01-26 DIAGNOSIS — I509 Heart failure, unspecified: Secondary | ICD-10-CM | POA: Diagnosis not present

## 2022-01-26 DIAGNOSIS — R609 Edema, unspecified: Secondary | ICD-10-CM

## 2022-01-26 DIAGNOSIS — Z86718 Personal history of other venous thrombosis and embolism: Secondary | ICD-10-CM | POA: Insufficient documentation

## 2022-01-26 DIAGNOSIS — E1122 Type 2 diabetes mellitus with diabetic chronic kidney disease: Secondary | ICD-10-CM | POA: Diagnosis not present

## 2022-01-26 DIAGNOSIS — I739 Peripheral vascular disease, unspecified: Secondary | ICD-10-CM | POA: Diagnosis not present

## 2022-01-26 DIAGNOSIS — E11622 Type 2 diabetes mellitus with other skin ulcer: Secondary | ICD-10-CM

## 2022-01-26 DIAGNOSIS — Z95820 Peripheral vascular angioplasty status with implants and grafts: Secondary | ICD-10-CM | POA: Insufficient documentation

## 2022-01-26 DIAGNOSIS — L97511 Non-pressure chronic ulcer of other part of right foot limited to breakdown of skin: Secondary | ICD-10-CM

## 2022-01-26 DIAGNOSIS — L0390 Cellulitis, unspecified: Secondary | ICD-10-CM | POA: Diagnosis not present

## 2022-01-26 DIAGNOSIS — Z7984 Long term (current) use of oral hypoglycemic drugs: Secondary | ICD-10-CM | POA: Diagnosis not present

## 2022-01-26 DIAGNOSIS — I5033 Acute on chronic diastolic (congestive) heart failure: Secondary | ICD-10-CM | POA: Diagnosis not present

## 2022-01-26 DIAGNOSIS — Z79899 Other long term (current) drug therapy: Secondary | ICD-10-CM | POA: Insufficient documentation

## 2022-01-26 DIAGNOSIS — N1832 Chronic kidney disease, stage 3b: Secondary | ICD-10-CM | POA: Insufficient documentation

## 2022-01-26 DIAGNOSIS — E11621 Type 2 diabetes mellitus with foot ulcer: Secondary | ICD-10-CM | POA: Diagnosis not present

## 2022-01-26 DIAGNOSIS — L03115 Cellulitis of right lower limb: Secondary | ICD-10-CM | POA: Diagnosis not present

## 2022-01-26 DIAGNOSIS — N3000 Acute cystitis without hematuria: Secondary | ICD-10-CM | POA: Diagnosis not present

## 2022-01-26 DIAGNOSIS — L97918 Non-pressure chronic ulcer of unspecified part of right lower leg with other specified severity: Secondary | ICD-10-CM | POA: Diagnosis not present

## 2022-01-26 DIAGNOSIS — L97518 Non-pressure chronic ulcer of other part of right foot with other specified severity: Secondary | ICD-10-CM | POA: Insufficient documentation

## 2022-01-26 DIAGNOSIS — L89152 Pressure ulcer of sacral region, stage 2: Secondary | ICD-10-CM | POA: Diagnosis not present

## 2022-01-26 DIAGNOSIS — Z7901 Long term (current) use of anticoagulants: Secondary | ICD-10-CM | POA: Diagnosis not present

## 2022-01-26 DIAGNOSIS — L03119 Cellulitis of unspecified part of limb: Secondary | ICD-10-CM

## 2022-01-26 DIAGNOSIS — N179 Acute kidney failure, unspecified: Secondary | ICD-10-CM | POA: Insufficient documentation

## 2022-01-26 DIAGNOSIS — Z7982 Long term (current) use of aspirin: Secondary | ICD-10-CM | POA: Diagnosis not present

## 2022-01-26 DIAGNOSIS — Z794 Long term (current) use of insulin: Secondary | ICD-10-CM | POA: Diagnosis not present

## 2022-01-26 DIAGNOSIS — Z955 Presence of coronary angioplasty implant and graft: Secondary | ICD-10-CM | POA: Insufficient documentation

## 2022-01-26 DIAGNOSIS — Z20822 Contact with and (suspected) exposure to covid-19: Secondary | ICD-10-CM | POA: Diagnosis not present

## 2022-01-26 LAB — CBC WITH DIFFERENTIAL/PLATELET
Abs Immature Granulocytes: 0.02 10*3/uL (ref 0.00–0.07)
Basophils Absolute: 0 10*3/uL (ref 0.0–0.1)
Basophils Relative: 1 %
Eosinophils Absolute: 0.1 10*3/uL (ref 0.0–0.5)
Eosinophils Relative: 1 %
HCT: 46.9 % (ref 39.0–52.0)
Hemoglobin: 13.7 g/dL (ref 13.0–17.0)
Immature Granulocytes: 0 %
Lymphocytes Relative: 25 %
Lymphs Abs: 1.3 10*3/uL (ref 0.7–4.0)
MCH: 24.6 pg — ABNORMAL LOW (ref 26.0–34.0)
MCHC: 29.2 g/dL — ABNORMAL LOW (ref 30.0–36.0)
MCV: 84.4 fL (ref 80.0–100.0)
Monocytes Absolute: 0.8 10*3/uL (ref 0.1–1.0)
Monocytes Relative: 15 %
Neutro Abs: 3 10*3/uL (ref 1.7–7.7)
Neutrophils Relative %: 58 %
Platelets: 182 10*3/uL (ref 150–400)
RBC: 5.56 MIL/uL (ref 4.22–5.81)
RDW: 17.5 % — ABNORMAL HIGH (ref 11.5–15.5)
WBC: 5.1 10*3/uL (ref 4.0–10.5)
nRBC: 0 % (ref 0.0–0.2)

## 2022-01-26 LAB — URINALYSIS, ROUTINE W REFLEX MICROSCOPIC
Bilirubin Urine: NEGATIVE
Glucose, UA: >=500 mg/dL — AB
Hgb urine dipstick: NEGATIVE
Ketones, ur: NEGATIVE mg/dL
Nitrite: POSITIVE — AB
Protein, ur: NEGATIVE mg/dL
Specific Gravity, Urine: 1.011 (ref 1.005–1.030)
pH: 5 (ref 5.0–8.0)

## 2022-01-26 LAB — COMPREHENSIVE METABOLIC PANEL
ALT: 42 U/L (ref 0–44)
AST: 25 U/L (ref 15–41)
Albumin: 3.2 g/dL — ABNORMAL LOW (ref 3.5–5.0)
Alkaline Phosphatase: 64 U/L (ref 38–126)
Anion gap: 12 (ref 5–15)
BUN: 37 mg/dL — ABNORMAL HIGH (ref 8–23)
CO2: 20 mmol/L — ABNORMAL LOW (ref 22–32)
Calcium: 8.8 mg/dL — ABNORMAL LOW (ref 8.9–10.3)
Chloride: 102 mmol/L (ref 98–111)
Creatinine, Ser: 2.01 mg/dL — ABNORMAL HIGH (ref 0.61–1.24)
GFR, Estimated: 35 mL/min — ABNORMAL LOW (ref >60)
Glucose, Bld: 155 mg/dL — ABNORMAL HIGH (ref 70–99)
Potassium: 4.4 mmol/L (ref 3.5–5.1)
Sodium: 134 mmol/L — ABNORMAL LOW (ref 135–145)
Total Bilirubin: 0.6 mg/dL (ref 0.3–1.2)
Total Protein: 6.8 g/dL (ref 6.5–8.1)

## 2022-01-26 LAB — RESP PANEL BY RT-PCR (FLU A&B, COVID) ARPGX2
Influenza A by PCR: NEGATIVE
Influenza B by PCR: NEGATIVE
SARS Coronavirus 2 by RT PCR: NEGATIVE

## 2022-01-26 LAB — CBG MONITORING, ED
Glucose-Capillary: 59 mg/dL — ABNORMAL LOW (ref 70–99)
Glucose-Capillary: 65 mg/dL — ABNORMAL LOW (ref 70–99)
Glucose-Capillary: 105 mg/dL — ABNORMAL HIGH (ref 70–99)
Glucose-Capillary: 154 mg/dL — ABNORMAL HIGH (ref 70–99)

## 2022-01-26 LAB — LACTIC ACID, PLASMA
Lactic Acid, Venous: 2.3 mmol/L (ref 0.5–1.9)
Lactic Acid, Venous: 1.5 mmol/L (ref 0.5–1.9)

## 2022-01-26 LAB — VALPROIC ACID LEVEL: Valproic Acid Lvl: 40 ug/mL — ABNORMAL LOW (ref 50.0–100.0)

## 2022-01-26 MED ORDER — ONDANSETRON HCL 4 MG/2ML IJ SOLN: 4.0000 mg | Freq: Four times a day (QID) | INTRAMUSCULAR | Status: DC | PRN

## 2022-01-26 MED ORDER — FUROSEMIDE 10 MG/ML IJ SOLN
40.0000 mg | Freq: Every day | INTRAMUSCULAR | Status: DC
  Filled 2022-01-26: qty 4

## 2022-01-26 MED ORDER — BUPROPION HCL 75 MG PO TABS
75.0000 mg | ORAL_TABLET | Freq: Every day | ORAL | Status: DC
  Administered 2022-01-27 – 2022-01-29 (×3): 75 mg via ORAL
  Filled 2022-01-26 (×3): qty 1

## 2022-01-26 MED ORDER — FUROSEMIDE 10 MG/ML IJ SOLN: 20.0000 mg | Freq: Every day | INTRAMUSCULAR | Status: DC

## 2022-01-26 MED ORDER — ONDANSETRON HCL 4 MG PO TABS: 4.0000 mg | ORAL_TABLET | Freq: Four times a day (QID) | ORAL | Status: DC | PRN

## 2022-01-26 MED ORDER — POLYETHYLENE GLYCOL 3350 17 G PO PACK
17.0000 g | PACK | Freq: Every day | ORAL | Status: DC | PRN
  Administered 2022-01-28: 17 g via ORAL
  Filled 2022-01-26: qty 1

## 2022-01-26 MED ORDER — METOPROLOL TARTRATE 25 MG PO TABS
25.0000 mg | ORAL_TABLET | Freq: Two times a day (BID) | ORAL | Status: DC
  Administered 2022-01-27 – 2022-01-29 (×5): 25 mg via ORAL
  Filled 2022-01-26 (×7): qty 1

## 2022-01-26 MED ORDER — ACETAMINOPHEN 325 MG PO TABS
650.0000 mg | ORAL_TABLET | Freq: Four times a day (QID) | ORAL | Status: DC | PRN
  Administered 2022-01-28: 650 mg via ORAL
  Filled 2022-01-26: qty 2

## 2022-01-26 MED ORDER — ONDANSETRON HCL 4 MG/2ML IJ SOLN
4.0000 mg | Freq: Once | INTRAMUSCULAR | Status: AC
  Administered 2022-01-26: 4 mg via INTRAVENOUS
  Filled 2022-01-26: qty 2

## 2022-01-26 MED ORDER — ARIPIPRAZOLE 10 MG PO TABS
20.0000 mg | ORAL_TABLET | Freq: Every day | ORAL | Status: DC
  Administered 2022-01-27 – 2022-01-29 (×3): 20 mg via ORAL
  Filled 2022-01-26 (×3): qty 2

## 2022-01-26 MED ORDER — HEPARIN (PORCINE) 25000 UT/250ML-% IV SOLN
1600.0000 [IU]/h | INTRAVENOUS | Status: DC
Start: 2022-01-26 — End: 2022-01-27
  Administered 2022-01-27: 1500 [IU]/h via INTRAVENOUS
  Filled 2022-01-26: qty 250

## 2022-01-26 MED ORDER — VANCOMYCIN HCL 10 G IV SOLR
2500.0000 mg | Freq: Once | INTRAVENOUS | Status: AC
  Administered 2022-01-26: 2500 mg via INTRAVENOUS
  Filled 2022-01-26: qty 2500

## 2022-01-26 MED ORDER — ASPIRIN EC 81 MG PO TBEC
81.0000 mg | DELAYED_RELEASE_TABLET | Freq: Every day | ORAL | Status: DC
  Administered 2022-01-27 – 2022-01-29 (×3): 81 mg via ORAL
  Filled 2022-01-26 (×3): qty 1

## 2022-01-26 MED ORDER — FUROSEMIDE 10 MG/ML IJ SOLN: 40.0000 mg | Freq: Every day | INTRAMUSCULAR | Status: DC

## 2022-01-26 MED ORDER — ATORVASTATIN CALCIUM 10 MG PO TABS
10.0000 mg | ORAL_TABLET | Freq: Every day | ORAL | Status: DC
  Administered 2022-01-27 – 2022-01-29 (×3): 10 mg via ORAL
  Filled 2022-01-26 (×3): qty 1

## 2022-01-26 MED ORDER — INSULIN ASPART 100 UNIT/ML IJ SOLN
0.0000 [IU] | Freq: Three times a day (TID) | INTRAMUSCULAR | Status: DC
  Administered 2022-01-27 – 2022-01-28 (×2): 2 [IU] via SUBCUTANEOUS
  Administered 2022-01-28: 1 [IU] via SUBCUTANEOUS
  Administered 2022-01-28: 2 [IU] via SUBCUTANEOUS
  Administered 2022-01-29: 5 [IU] via SUBCUTANEOUS
  Administered 2022-01-29: 1 [IU] via SUBCUTANEOUS

## 2022-01-26 MED ORDER — MORPHINE SULFATE (PF) 4 MG/ML IV SOLN
4.0000 mg | Freq: Once | INTRAVENOUS | Status: AC
  Administered 2022-01-26: 4 mg via INTRAVENOUS
  Filled 2022-01-26: qty 1

## 2022-01-26 MED ORDER — QUETIAPINE FUMARATE 50 MG PO TABS
50.0000 mg | ORAL_TABLET | Freq: Every day | ORAL | Status: DC
  Administered 2022-01-26 – 2022-01-28 (×3): 50 mg via ORAL
  Filled 2022-01-26 (×4): qty 1

## 2022-01-26 MED ORDER — OXYCODONE HCL 5 MG PO TABS
5.0000 mg | ORAL_TABLET | ORAL | Status: DC | PRN
  Administered 2022-01-28 (×2): 5 mg via ORAL
  Filled 2022-01-26 (×2): qty 1

## 2022-01-26 MED ORDER — VANCOMYCIN HCL 1750 MG/350ML IV SOLN
1750.0000 mg | INTRAVENOUS | Status: DC
  Administered 2022-01-27: 1750 mg via INTRAVENOUS
  Filled 2022-01-26: qty 350

## 2022-01-26 MED ORDER — INSULIN GLARGINE-YFGN 100 UNIT/ML ~~LOC~~ SOLN
10.0000 [IU] | Freq: Every day | SUBCUTANEOUS | Status: DC
  Filled 2022-01-26: qty 0.1

## 2022-01-26 MED ORDER — MIRTAZAPINE 15 MG PO TABS
15.0000 mg | ORAL_TABLET | Freq: Every day | ORAL | Status: DC
  Administered 2022-01-26 – 2022-01-28 (×3): 15 mg via ORAL
  Filled 2022-01-26 (×4): qty 1

## 2022-01-26 MED ORDER — DIVALPROEX SODIUM ER 500 MG PO TB24
500.0000 mg | ORAL_TABLET | Freq: Every day | ORAL | Status: DC
  Administered 2022-01-27 – 2022-01-29 (×3): 500 mg via ORAL
  Filled 2022-01-26 (×3): qty 1

## 2022-01-26 MED ORDER — SODIUM CHLORIDE 0.9 % IV SOLN
2.0000 g | Freq: Two times a day (BID) | INTRAVENOUS | Status: DC
  Administered 2022-01-26 – 2022-01-28 (×5): 2 g via INTRAVENOUS
  Filled 2022-01-26 (×5): qty 2

## 2022-01-26 MED ORDER — METOPROLOL SUCCINATE ER 25 MG PO TB24: 50.0000 mg | ORAL_TABLET | Freq: Every day | ORAL | Status: DC

## 2022-01-26 MED ORDER — SODIUM CHLORIDE 0.9 % IV SOLN
2.0000 g | Freq: Once | INTRAVENOUS | Status: DC
  Administered 2022-01-26: 2 g via INTRAVENOUS
  Filled 2022-01-26: qty 20

## 2022-01-26 NOTE — ED Notes (Signed)
IV team at bedside 

## 2022-01-26 NOTE — ED Notes (Signed)
Pt provided with 8 oz OJ, peanut butter, crackers to treat low CBG.

## 2022-01-26 NOTE — Progress Notes (Signed)
Subjective: 71 year old male presents the office today for concerns of worsening wound most on his right side.  I last saw him in November 2022.  That point the wounds are slowly healing.  He also advised follow-up with vascular surgery.  He has not had follow-up for the last couple months.  His family member who is with him today states that they just found out the new wound on the right ankle but he states is on the last couple weeks.  Denies fevers or chills.  He has been applying "healing cream" the wounds.   Objective: AAO x3, NAD DP/PT pulses decreased bilaterally.  Digits are cool to touch bilaterally. 5 x 5 ulceration noted medial aspect just proximal to the ankle on the right side ink color serosanguineous drainage is expressed.  No fluctuation or crepitation.  Superficial wound on the right second toe with localized edema.  No drainage or pus.  Preulcerative areas to the digits on the left side. Edema noted to the right leg and there is increased temperature gradient.         Assessment:  Bilateral ulcerations, PAD; concern for cellulitis, infection  Plan: -All treatment options discussed with the patient including all alternatives, risks, complications.  -X-rays obtained reviewed.  No obvious signs of osseous destruction. -Given the worsening of the wounds and the swelling to the leg and concern about infection.  Due to this I recommend him go to the emergency department for further evaluation and likely admission to the hospital.  If admitted podiatry will follow but also recommend vascular surgery consultation.  Recontact the emergency department lymph node close arrival.  Vivi Barrack DPM

## 2022-01-26 NOTE — Progress Notes (Signed)
Pt refused CPAP for tonight.  

## 2022-01-26 NOTE — Consult Note (Signed)
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  He has CEAP C6 venous disease.  Based on his previous arteriogram he has severe tibial artery occlusive disease bilaterally.  Given his obesity and medical comorbidities he would be at high risk for open bypass and the distal target is not ideal on either side.  An endovascular approach would be associated with increased risk given his severe diffuse tibial disease.  Fortunately he healed some wounds on the left foot and may be able to heal wounds on the right.  His toe pressure on the right back in September of last year was 43 mmHg which would suggest marginal perfusion for healing.  Follow-up arterial Doppler studies have been ordered.  At this point I do not think a repeat arteriogram is necessary.  Normally the treatment for his venous disease would be elevation and compression.  However, given his peripheral arterial disease I would only use a some mild leg elevation (1-2 pillows), and mild compression.  He is being admitted for intravenous antibiotics.  We can follow his wounds while he is here and then follow the wounds as an outpatient once he is discharged.  However no further arterial work-up is indicated from our standpoint at this point.  REASON FOR CONSULT:    Peripheral arterial disease with wounds on the right leg.  The consult is requested by the emergency department.  HPI:   Christian [name] is a 71 y.o. male who was originally seen by Dr. Virl Cagey on 09/14/2021 with nonhealing wounds of the left foot.  He had been referred by Dr. Jacqualyn Posey.  He had a traumatic nail avulsion of the left third toe and an ulceration on his fourth toe.  The wounds have been present for 2 months.  He underwent a diagnostic arteriogram on 09/16/2021.  He had Rutherford 5 critical limb ischemia.  The patient did  not have inline flow to the foot however the posterior tibial artery reconstituted distally.  He was not felt to be a great candidate for a bypass given his morbid obesity and deconditioning.  If the wounds progressed he would be considered for a high risk endovascular intervention.  On my history the patient noted a wound on the right leg that has been present for at least 2 weeks.  He does not remember any specific injury to the leg.  He denies significant pain in his legs.  He is ambulatory with a walker.  I do not get any history of claudication although I think his activity is quite limited.  He denies any history of rest pain.  He has a history of coronary artery disease and underwent previous PTCA.  He is followed by his cardiologist in [place] and had a recent stress test although I do not have those results.  He does have a history of congestive heart failure.  The patient is on Xarelto for history of DVT.   Past Medical History:  Diagnosis Date   Bilateral lower extremity edema: chronic with venous stasis changes 06/02/2014   Bipolar 1 disorder (HCC)    Bipolar disorder (HCC)    CHF (congestive heart failure) (HCC)    Chronic kidney disease    Gout    Gout    Hx of blood clots    Hypertension    Mood swings    OSA on CPAP 06/02/2014  Other and unspecified hyperlipidemia 06/02/2014   Type II or unspecified type diabetes mellitus with unspecified complication, uncontrolled 06/02/2014    Family History  Problem Relation Age of Onset   Dementia Mother    Arthritis Father    Hypertension Father    Mental illness Sister    Diabetes Sister    Heart failure Neg Hx    Kidney failure Neg Hx    Cancer Neg Hx     SOCIAL HISTORY: Social History   Tobacco Use   Smoking status: Never   Smokeless tobacco: Never  Substance Use Topics   Alcohol use: No    Allergies  Allergen Reactions   Amlodipine Swelling and Other (See Comments)    Legs became swollen   Onglyza [Saxagliptin]  Other (See Comments)    "Allergic," per [place]   Zyprexa [Olanzapine] Other (See Comments)    Allergy noted by ACT Team. No reaction specified (??)- "Allergic," per [place]   Ritalin [Methylphenidate] Anxiety    "[place]"    Current Facility-Administered Medications  Medication Dose Route Frequency Provider Last Rate Last Admin   cefTRIAXone (ROCEPHIN) 2 g in sodium chloride 0.9 % 100 mL IVPB  2 g Intravenous Once Isla Pence, MD       vancomycin (VANCOCIN) 2,500 mg in sodium chloride 0.9 % 500 mL IVPB  2,500 mg Intravenous Once Pauletta Browns, [facility]       [START ON 01/27/2022] vancomycin (VANCOREADY) IVPB 1750 mg/350 mL  1,750 mg Intravenous Q24H Pauletta Browns, [facility]       Current Outpatient Medications  Medication Sig Dispense Refill   acetaminophen (TYLENOL) 500 MG tablet Take 500-1,000 mg by mouth every 6 (six) hours as needed (pain.).     ARIPiprazole (ABILIFY) 20 MG tablet Take 1 tablet (20 mg total) by mouth daily. 30 tablet 0   aspirin EC 81 MG tablet Take 81 mg by mouth daily. Swallow whole.     atorvastatin (LIPITOR) 10 MG tablet Take 1 tablet (10 mg total) by mouth daily at 6 PM. (Patient taking differently: Take 10 mg by mouth daily.) 30 tablet 0   buPROPion (WELLBUTRIN) 75 MG tablet Take 1 tablet (75 mg total) by mouth daily. 30 tablet 0   divalproex (DEPAKOTE ER) 500 MG 24 hr tablet Take 500 mg by mouth in the morning and at bedtime.     empagliflozin (JARDIANCE) 10 MG TABS tablet Take 10 mg by mouth daily.      glimepiride (AMARYL) 2 MG tablet Take 2 mg by mouth daily.     insulin glargine (LANTUS) 100 UNIT/ML injection Inject 10 Units into the skin daily.     liothyronine (CYTOMEL) 5 MCG tablet Take 10 mcg by mouth daily.     metoprolol succinate (TOPROL-XL) 50 MG 24 hr tablet Take 50 mg by mouth daily. Take with or immediately following a meal.     mirtazapine (REMERON) 15 MG tablet Take 15 mg by mouth at bedtime.     mupirocin ointment  (BACTROBAN) 2 % APPLY 1 APPLICATION TOPICALLY DAILY. APPLIED TO TOE WOUND (Patient taking differently: Apply 1 application topically See admin instructions. 1 application daily to toe wound) 22 g 0   polyethylene glycol powder (GLYCOLAX/MIRALAX) 17 GM/SCOOP powder Take 17 g by mouth daily. (Patient taking differently: Take 17 g by mouth daily as needed for mild constipation.) 255 g 0   QUEtiapine (SEROQUEL) 50 MG tablet Take 50 mg by mouth at bedtime.  rivaroxaban (XARELTO) 20 MG TABS tablet Take 1 tablet (20 mg total) by mouth daily with supper. (Patient taking differently: Take 20 mg by mouth at bedtime.) 30 tablet 0   furosemide (LASIX) 40 MG tablet Take 1 tablet (40 mg total) by mouth daily. (Patient not taking: Reported on 01/26/2022) 30 tablet    metoprolol succinate (TOPROL-XL) 25 MG 24 hr tablet Take 2 tablets (50 mg total) by mouth daily. Take with or immediately following a meal. (Patient not taking: Reported on 01/26/2022)      REVIEW OF SYSTEMS:  [X]  denotes positive finding, [ ]  denotes negative finding Cardiac  Comments:  Chest pain or chest pressure:    Shortness of breath upon exertion: x   Short of breath when lying flat:    Irregular heart rhythm:        Vascular    Pain in calf, thigh, or hip brought on by ambulation:    Pain in feet at night that wakes you up from your sleep:     Blood clot in your veins:    Leg swelling:         Pulmonary    Oxygen at home:    Productive cough:     Wheezing:         Neurologic    Sudden weakness in arms or legs:     Sudden numbness in arms or legs:     Sudden onset of difficulty speaking or slurred speech:    Temporary loss of vision in one eye:     Problems with dizziness:         Gastrointestinal    Blood in stool:     Vomited blood:         Genitourinary    Burning when urinating:     Blood in urine:        Psychiatric    Major depression:         Hematologic    Bleeding problems:    Problems with blood clotting  too easily:        Skin    Rashes or ulcers: x Right leg      Constitutional    Fever or chills:    -  PHYSICAL EXAM:   Vitals:   01/26/22 1011 01/26/22 1139 01/26/22 1200  BP: 120/74 140/76   Pulse: 84 73   Resp: 16 16   Temp: 98.7 F (37.1 C)    TempSrc: Oral    SpO2: 95% 100%   Weight:   121.7 kg  Height:   6' (1.829 m)   Body mass index is 36.4 kg/m. GENERAL: The patient is a well-nourished male, in no acute distress. The vital signs are documented above. CARDIAC: There is a regular rate and rhythm.  VASCULAR: I do not detect carotid bruits. He has palpable femoral pulses. I cannot palpate popliteal or pedal pulses. PULMONARY: There is good air exchange bilaterally without wheezing or rales. ABDOMEN: Soft and non-tender with normal pitched bowel sounds.  MUSCULOSKELETAL: There are no major deformities. NEUROLOGIC: No focal weakness or paresthesias are detected. SKIN:  On the right side, there is hyperpigmentation and lipodermatosclerosis consistent with combined chronic venous insufficiency and lymphedema.  There is a wound on the medial right leg.     On the left leg there is also hyperpigmentation and lipodermatosclerosis.  There is a small wound on the third toe at the tip.      PSYCHIATRIC: The patient has a normal affect.  DATA:  ARTERIAL DOPPLER STUDY: I did review his arterial Doppler studies from 08/25/2021.  On the right side he had a dampened and monophasic posterior tibial and peroneal signal with no dorsalis pedis signal.  ABI was not reliable as the arteries were calcified.  Toe pressure was 43 mmHg.  On the left side there was a monophasic posterior tibial and peroneal signal with no dorsalis pedis signal.  ABI was 94% although likely falsely elevated.  Toe pressure was 47 mmHg.  LABS: His GFR is 35.  Creatinine is 2.0.  White blood cell count 5.1.  Hemoglobin 13.7  Blood cultures are pending.  Deitra [facility]

## 2022-01-26 NOTE — ED Notes (Signed)
Pt provided with additional OJ, PB, graham crackers to treat low CBG.

## 2022-01-26 NOTE — ED Provider Notes (Signed)
[facility] EMERGENCY DEPARTMENT Provider Note   CSN: [id number] Arrival date & time: 01/26/22  1005     History  Chief Complaint  Patient presents with   Wound Infection   Foot Pain    Christian [name] is a 71 y.o. male.  Pt is a 72 yo aa male with a hx of bipolar d/o, dm2, pvd, osa on cpap, htn, dvt, ckd, chf, and gout.  Pt has seen vascular for a hx of nonhealing wounds.  He saw them last in December and they offered an angiogram.  However, he wanted to wait.  2 weeks ago, he noticed some wounds on his right lower leg.  He did not tell his family until 2 days ago.  His family member called Dr. Jacqualyn Posey (podiatry) for an emergency appt.  Pt has seen Dr. Jacqualyn Posey in the past for other nonhealing wounds.  Dr. Jacqualyn Posey sent pt here as the wounds were concerning.  Pt denies fevers.  He has been taking extra strength tylenol for the pain, however.  Pt also has some lower abdominal pain.  Xrays of foot done at his office.      Home Medications Prior to Admission medications   Medication Sig Start Date End Date Taking? Authorizing Provider  acetaminophen (TYLENOL) 500 MG tablet Take 500-1,000 mg by mouth every 6 (six) hours as needed (pain.).   Yes [provider]  ARIPiprazole (ABILIFY) 20 MG tablet Take 1 tablet (20 mg total) by mouth daily. 02/06/20  Yes Starkes-Perry, Gayland Curry, FNP  aspirin EC 81 MG tablet Take 81 mg by mouth daily. Swallow whole.   Yes [provider]  atorvastatin (LIPITOR) 10 MG tablet Take 1 tablet (10 mg total) by mouth daily at 6 PM. Patient taking differently: Take 10 mg by mouth daily. 02/05/20  Yes Starkes-Perry, Gayland Curry, FNP  buPROPion (WELLBUTRIN) 75 MG tablet Take 1 tablet (75 mg total) by mouth daily. 02/06/20  Yes Starkes-Perry, Gayland Curry, FNP  divalproex (DEPAKOTE ER) 500 MG 24 hr tablet Take 500 mg by mouth in the morning and at bedtime.   Yes [provider]  empagliflozin (JARDIANCE) 10 MG TABS tablet Take 10 mg by  mouth daily.  06/11/20  Yes [provider]  glimepiride (AMARYL) 2 MG tablet Take 2 mg by mouth daily.   Yes [provider]  insulin glargine (LANTUS) 100 UNIT/ML injection Inject 10 Units into the skin daily.   Yes [provider]  liothyronine (CYTOMEL) 5 MCG tablet Take 10 mcg by mouth daily.   Yes [provider]  metoprolol succinate (TOPROL-XL) 50 MG 24 hr tablet Take 50 mg by mouth daily. Take with or immediately following a meal.   Yes [provider]  mirtazapine (REMERON) 15 MG tablet Take 15 mg by mouth at bedtime. 05/01/21  Yes [provider]  mupirocin ointment (BACTROBAN) 2 % APPLY 1 APPLICATION TOPICALLY DAILY. APPLIED TO TOE WOUND Patient taking differently: Apply 1 application topically See admin instructions. 1 application daily to toe wound 12/16/21  Yes Trula Slade, DPM  polyethylene glycol powder (GLYCOLAX/MIRALAX) 17 GM/SCOOP powder Take 17 g by mouth daily. Patient taking differently: Take 17 g by mouth daily as needed for mild constipation. 10/14/20  Yes Ghimire, [name], MD  QUEtiapine (SEROQUEL) 50 MG tablet Take 50 mg by mouth at bedtime.   Yes [provider]  rivaroxaban (XARELTO) 20 MG TABS tablet Take 1 tablet (20 mg total) by mouth daily with supper.  Patient taking differently: Take 20 mg by mouth at bedtime. 04/02/20  Yes Dhungel, Nishant, MD  furosemide (LASIX) 40 MG tablet Take 1 tablet (40 mg total) by mouth daily. Patient not taking: Reported on 01/26/2022 04/06/21   Shelly Coss, MD  metoprolol succinate (TOPROL-XL) 25 MG 24 hr tablet Take 2 tablets (50 mg total) by mouth daily. Take with or immediately following a meal. Patient not taking: Reported on 01/26/2022 04/06/21   Shelly Coss, MD      Allergies    Amlodipine, Onglyza [saxagliptin], Zyprexa [olanzapine], and Ritalin [methylphenidate]    Review of Systems   Review of Systems  Gastrointestinal:  Positive for abdominal pain.   Skin:  Positive for color change and wound.  All other systems reviewed and are negative.  Physical Exam Updated Vital Signs BP 140/76    Pulse 73    Temp 98.7 F (37.1 C) (Oral)    Resp 16    Ht 6' (1.829 m)    Wt 121.7 kg    SpO2 100%    BMI 36.40 kg/m  Physical Exam Vitals and nursing note reviewed.  Constitutional:      Appearance: Normal appearance. He is obese.  HENT:     Head: Normocephalic and atraumatic.     Right Ear: External ear normal.     Left Ear: External ear normal.     Nose: Nose normal.     Mouth/Throat:     Mouth: Mucous membranes are moist.     Pharynx: Oropharynx is clear.  Eyes:     Extraocular Movements: Extraocular movements intact.     Pupils: Pupils are equal, round, and reactive to light.  Cardiovascular:     Rate and Rhythm: Normal rate and regular rhythm.     Pulses: Normal pulses.     Heart sounds: Normal heart sounds.  Pulmonary:     Effort: Pulmonary effort is normal.     Breath sounds: Normal breath sounds.  Abdominal:     General: Abdomen is flat. Bowel sounds are normal.     Palpations: Abdomen is soft.  Musculoskeletal:        General: Normal range of motion.     Cervical back: Normal range of motion and neck supple.  Skin:    Capillary Refill: Capillary refill takes less than 2 seconds.     Comments: Nonhealing wound to top of right 2nd toe.  Non healing wound distal right, medial lower extremity.  Some drainage.  Cellulitis up to knee on right. See pictures.  Neurological:     General: No focal deficit present.     Mental Status: He is alert and oriented to person, place, and time.  Psychiatric:        Mood and Affect: Mood normal.        Behavior: Behavior normal.         ED Results / Procedures / Treatments   Labs (all labs ordered are listed, but only abnormal results are displayed) Labs Reviewed  LACTIC ACID, PLASMA - Abnormal; Notable for the following components:      Result Value   Lactic Acid, Venous 2.3 (*)     All other components within normal limits  COMPREHENSIVE METABOLIC PANEL - Abnormal; Notable for the following components:   Sodium 134 (*)    CO2 20 (*)    Glucose, Bld 155 (*)    BUN 37 (*)    Creatinine, Ser 2.01 (*)    Calcium 8.8 (*)  Albumin 3.2 (*)    GFR, Estimated 35 (*)    All other components within normal limits  CBC WITH DIFFERENTIAL/PLATELET - Abnormal; Notable for the following components:   MCH 24.6 (*)    MCHC 29.2 (*)    RDW 17.5 (*)    All other components within normal limits  URINALYSIS, ROUTINE W REFLEX MICROSCOPIC - Abnormal; Notable for the following components:   Glucose, UA >=500 (*)    Nitrite POSITIVE (*)    Leukocytes,Ua MODERATE (*)    Bacteria, UA MANY (*)    All other components within normal limits  CULTURE, BLOOD (ROUTINE X 2)  CULTURE, BLOOD (ROUTINE X 2)  RESP PANEL BY RT-PCR (FLU A&B, COVID) ARPGX2  LACTIC ACID, PLASMA  VALPROIC ACID LEVEL  HEMOGLOBIN A1C    EKG None  Radiology No results found.  Procedures Procedures    Medications Ordered in ED Medications  cefTRIAXone (ROCEPHIN) 2 g in sodium chloride 0.9 % 100 mL IVPB (has no administration in time range)  vancomycin (VANCOCIN) 2,500 mg in sodium chloride 0.9 % 500 mL IVPB (has no administration in time range)  vancomycin (VANCOREADY) IVPB 1750 mg/350 mL (has no administration in time range)  morphine (PF) 4 MG/ML injection 4 mg (4 mg Intravenous Given 01/26/22 1217)  ondansetron (ZOFRAN) injection 4 mg (4 mg Intravenous Given 01/26/22 1216)    ED Course/ Medical Decision Making/ A&P                           Medical Decision Making Amount and/or Complexity of Data Reviewed Labs: ordered. ECG/medicine tests: ordered.  Risk Prescription drug management. Decision regarding hospitalization.   This patient presents to the ED for concern of nonhealing wounds, this involves an extensive number of treatment options, and is a complaint that carries with it a high risk  of complications and morbidity.  The differential diagnosis includes cellulitis, pvd, diabetic ulcer   Co morbidities that complicate the patient evaluation   bipolar d/o, dm2, pvd, osa on cpap, htn, dvt, ckd, chf, and gout   Additional history obtained:  Additional history obtained from epic chart review External records from outside source obtained and reviewed including legal guardian, [name]   Lab Tests:  I Ordered, and personally interpreted labs.  The pertinent results include:  cbc is nl, lactic acid elevated at 2.3, cr is stable at 2.1.  UA + for UTI.     Imaging Studies ordered:  I ordered imaging studies including [place] for DVT and ABI  I independently visualized and interpreted imaging which showed pending I agree with the radiologist interpretation   Cardiac Monitoring:  The patient was maintained on a cardiac monitor.  I personally viewed and interpreted the cardiac monitored which showed an underlying rhythm of: nsr   Medicines ordered and prescription drug management:  I ordered medication including rocephin/vanc  for cellulitis and morphine for pain  Reevaluation of the patient after these medicines showed that the patient improved I have reviewed the patients home medicines and have made adjustments as needed   Test Considered:  Angiogram:  pt has ckd.  Vascular will consult and talk with pt about this.   Critical Interventions:  abx   Consultations Obtained:  I requested consultation with the vascular surgeon (Dr. Scot Dock),  and discussed lab and imaging findings as well as pertinent plan - they will review chart and consult. Pt d/w Dr. Roosevelt Locks (hospitalist) for admission.   Problem  List / ED Course:  Nonhealing wounds:  Pt has a known hx of PAD.  Vascular will consult.  Pt started on IV abx UTI:  IV Rocephin   Reevaluation:  After the interventions noted above, I reevaluated the patient and found that they have :stayed the  same   Social Determinants of Health:  Lives at home   Dispostion:  After consideration of the diagnostic results and the patients response to treatment, I feel that the patent would benefit from admission.          Final Clinical Impression(s) / ED Diagnoses Final diagnoses:  Cellulitis of right lower extremity  Nonhealing nonsurgical wound  Stage 3b chronic kidney disease ([place])  Acute cystitis without hematuria    Rx / DC Orders ED Discharge Orders     None         Isla Pence, MD 01/26/22 1241

## 2022-01-26 NOTE — ED Provider Notes (Incomplete)
[place] EMERGENCY DEPARTMENT Provider Note   CSN: [id number] Arrival date & time: 01/26/22  1005     History {Add pertinent medical, surgical, social history, OB history to HPI:1} Chief Complaint  Patient presents with   Wound Infection   Foot Pain    Christian [name] is a 71 y.o. male.  HPI     Home Medications Prior to Admission medications   Medication Sig Start Date End Date Taking? Authorizing Provider  acetaminophen (TYLENOL) 500 MG tablet Take 500-1,000 mg by mouth every 6 (six) hours as needed (pain.).   Yes [provider]  ARIPiprazole (ABILIFY) 20 MG tablet Take 1 tablet (20 mg total) by mouth daily. 02/06/20  Yes Starkes-Perry, Gayland Curry, FNP  aspirin EC 81 MG tablet Take 81 mg by mouth daily. Swallow whole.   Yes [provider]  atorvastatin (LIPITOR) 10 MG tablet Take 1 tablet (10 mg total) by mouth daily at 6 PM. Patient taking differently: Take 10 mg by mouth daily. 02/05/20  Yes Starkes-Perry, Gayland Curry, FNP  buPROPion (WELLBUTRIN) 75 MG tablet Take 1 tablet (75 mg total) by mouth daily. 02/06/20  Yes Starkes-Perry, Gayland Curry, FNP  divalproex (DEPAKOTE ER) 500 MG 24 hr tablet Take 500 mg by mouth in the morning and at bedtime.   Yes [provider]  empagliflozin (JARDIANCE) 10 MG TABS tablet Take 10 mg by mouth daily.  06/11/20  Yes [provider]  glimepiride (AMARYL) 2 MG tablet Take 2 mg by mouth daily.   Yes [provider]  insulin glargine (LANTUS) 100 UNIT/ML injection Inject 10 Units into the skin daily.   Yes [provider]  liothyronine (CYTOMEL) 5 MCG tablet Take 10 mcg by mouth daily.   Yes [provider]  metoprolol succinate (TOPROL-XL) 50 MG 24 hr tablet Take 50 mg by mouth daily. Take with or immediately following a meal.   Yes [provider]  mirtazapine (REMERON) 15 MG tablet Take 15 mg by mouth at bedtime. 05/01/21  Yes [provider]  mupirocin  ointment (BACTROBAN) 2 % APPLY 1 APPLICATION TOPICALLY DAILY. APPLIED TO TOE WOUND Patient taking differently: Apply 1 application topically See admin instructions. 1 application daily to toe wound 12/16/21  Yes Trula Slade, DPM  polyethylene glycol powder (GLYCOLAX/MIRALAX) 17 GM/SCOOP powder Take 17 g by mouth daily. Patient taking differently: Take 17 g by mouth daily as needed for mild constipation. 10/14/20  Yes Ghimire, [name], MD  QUEtiapine (SEROQUEL) 50 MG tablet Take 50 mg by mouth at bedtime.   Yes [provider]  rivaroxaban (XARELTO) 20 MG TABS tablet Take 1 tablet (20 mg total) by mouth daily with supper. Patient taking differently: Take 20 mg by mouth at bedtime. 04/02/20  Yes Dhungel, Nishant, MD  furosemide (LASIX) 40 MG tablet Take 1 tablet (40 mg total) by mouth daily. Patient not taking: Reported on 01/26/2022 04/06/21   Shelly Coss, MD  metoprolol succinate (TOPROL-XL) 25 MG 24 hr tablet Take 2 tablets (50 mg total) by mouth daily. Take with or immediately following a meal. Patient not taking: Reported on 01/26/2022 04/06/21   Shelly Coss, MD      Allergies    Amlodipine, Onglyza [saxagliptin], Zyprexa [olanzapine], and Ritalin [methylphenidate]    Review of Systems   Review of Systems  Physical Exam Updated Vital Signs BP 105/77    Pulse 69    Temp 98.7 F (37.1 C) (Oral)    Resp 16  Ht 6' (1.829 m)    Wt 121.7 kg    SpO2 99%    BMI 36.40 kg/m  Physical Exam  ED Results / Procedures / Treatments   Labs (all labs ordered are listed, but only abnormal results are displayed) Labs Reviewed  LACTIC ACID, PLASMA - Abnormal; Notable for the following components:      Result Value   Lactic Acid, Venous 2.3 (*)    All other components within normal limits  COMPREHENSIVE METABOLIC PANEL - Abnormal; Notable for the following components:   Sodium 134 (*)    CO2 20 (*)    Glucose, Bld 155 (*)    BUN 37 (*)    Creatinine, Ser 2.01 (*)    Calcium  8.8 (*)    Albumin 3.2 (*)    GFR, Estimated 35 (*)    All other components within normal limits  CBC WITH DIFFERENTIAL/PLATELET - Abnormal; Notable for the following components:   MCH 24.6 (*)    MCHC 29.2 (*)    RDW 17.5 (*)    All other components within normal limits  URINALYSIS, ROUTINE W REFLEX MICROSCOPIC - Abnormal; Notable for the following components:   Glucose, UA >=500 (*)    Nitrite POSITIVE (*)    Leukocytes,Ua MODERATE (*)    Bacteria, UA MANY (*)    All other components within normal limits  VALPROIC ACID LEVEL - Abnormal; Notable for the following components:   Valproic Acid Lvl 40 (*)    All other components within normal limits  CBG MONITORING, ED - Abnormal; Notable for the following components:   Glucose-Capillary 59 (*)    All other components within normal limits  RESP PANEL BY RT-PCR (FLU A&B, COVID) ARPGX2  CULTURE, BLOOD (ROUTINE X 2)  CULTURE, BLOOD (ROUTINE X 2)  MRSA NEXT GEN BY PCR, NASAL  LACTIC ACID, PLASMA  HEMOGLOBIN A1C  HEMOGLOBIN A1C    EKG None  Radiology VAS [place] ABI WITH/WO TBI  Result Date: 01/26/2022  LOWER EXTREMITY DOPPLER STUDY Patient Name:  Christian [name]  Date of Exam:   01/26/2022 Medical Rec #: [id number]         Accession #:    [id number] Date of Birth: [birth year]         Patient Gender: M Patient Age:   68 years Exam Location:  [facility] Procedure:      VAS [place] ABI WITH/WO TBI Referring Phys: Liberato Stansbery --------------------------------------------------------------------------------  Indications: Ulceration, and peripheral artery disease. High Risk Factors: Hypertension, Diabetes, no history of smoking, prior MI,                    coronary artery disease, prior CVA. Other Factors: CHF, CKD3, CABG.  Vascular Interventions: LLE angiogram and attempted PTA balloon angioplasty -                         09/16/21. Comparison Study: Previous exam 08/24/21 Performing Technologist: Rogelia Rohrer RVT, RDMS  Examination Guidelines: A  complete evaluation includes at minimum, Doppler waveform signals and systolic blood pressure reading at the level of bilateral brachial, anterior tibial, and posterior tibial arteries, when vessel segments are accessible. Bilateral testing is considered an integral part of a complete examination. Photoelectric Plethysmograph (PPG) waveforms and toe systolic pressure readings are included as required and additional duplex testing as needed. Limited examinations for reoccurring indications may be performed as noted.  ABI Findings: +---------+------------------+-----+-------------------+--------+  Right  Rt Pressure (mmHg) Index Waveform            Comment   +---------+------------------+-----+-------------------+--------+  Brachial  124                      triphasic                     +---------+------------------+-----+-------------------+--------+  PTA       132                1.02  dampened monophasic           +---------+------------------+-----+-------------------+--------+  DP        92                 0.71  dampened monophasic           +---------+------------------+-----+-------------------+--------+  Great Toe 32                 0.25  Abnormal            severe    +---------+------------------+-----+-------------------+--------+ +---------+------------------+-----+----------+---------------+  Left      Lt Pressure (mmHg) Index Waveform   Comment          +---------+------------------+-----+----------+---------------+  Brachial  129                      triphasic                   +---------+------------------+-----+----------+---------------+  PTA       121                0.94  monophasic                  +---------+------------------+-----+----------+---------------+  DP        111                0.86  monophasic                  +---------+------------------+-----+----------+---------------+  Great Toe 40                 0.31  Abnormal   moderate-severe   +---------+------------------+-----+----------+---------------+ +-------+-----------+-----------+------------+------------+  ABI/TBI Today's ABI Today's TBI Previous ABI Previous TBI  +-------+-----------+-----------+------------+------------+  Right   1.02        0.25        1.08         0.36          +-------+-----------+-----------+------------+------------+  Left    0.94        0.31        0.85         0.39          +-------+-----------+-----------+------------+------------+  Summary: Right: The right toe-brachial index is abnormal (severe disease). 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 Left: The left toe-brachial index is abnormal (moderate/severe disease). 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  *See table(s) above for measurements and observations.     Preliminary    VAS [place] LOWER EXTREMITY VENOUS (DVT) (ONLY [facility] & [facility])  Result Date: 01/26/2022  Lower Venous DVT Study Patient Name:  DONAHUE MONDOR  Date of Exam:   01/26/2022 Medical Rec #: [id number]         Accession #:  [id number] Date of Birth: Mar 26, 1951         Patient Gender: M Patient Age:   47 years Exam Location:  [facility] Procedure:      VAS [place] LOWER EXTREMITY VENOUS (DVT) Referring Phys: Shamell Suarez --------------------------------------------------------------------------------  Indications: Edema.  Risk Factors: HX of PE DVT HX (BLE). Anticoagulation: Xarelto. Limitations: Body habitus, poor ultrasound/tissue interface and shadowing. Comparison Study: Previous exam on 03/18/2020 was negative for DVT. Performing Technologist: Rogelia Rohrer RVT, RDMS  Examination Guidelines: A complete evaluation includes B-mode imaging, spectral Doppler, color Doppler, and power Doppler as needed of all  accessible portions of each vessel. Bilateral testing is considered an integral part of a complete examination. Limited examinations for reoccurring indications may be performed as noted. The reflux portion of the exam is performed with the patient in reverse Trendelenburg.  +---------+---------------+---------+-----------+----------+--------------+  RIGHT     Compressibility Phasicity Spontaneity Properties Thrombus Aging  +---------+---------------+---------+-----------+----------+--------------+  CFV       Full            Yes       Yes                                    +---------+---------------+---------+-----------+----------+--------------+  SFJ       Full                                                             +---------+---------------+---------+-----------+----------+--------------+  FV Prox   Full            Yes       Yes                                    +---------+---------------+---------+-----------+----------+--------------+  FV Mid    Full            Yes       Yes                                    +---------+---------------+---------+-----------+----------+--------------+  FV Distal Full            Yes       Yes                                    +---------+---------------+---------+-----------+----------+--------------+  PFV       Full                                                             +---------+---------------+---------+-----------+----------+--------------+  POP       Full            Yes       Yes                                    +---------+---------------+---------+-----------+----------+--------------+  PTV       Full                                                             +---------+---------------+---------+-----------+----------+--------------+  PERO      Full                                                             +---------+---------------+---------+-----------+----------+--------------+   +---------+---------------+---------+-----------+----------+--------------+   LEFT      Compressibility Phasicity Spontaneity Properties Thrombus Aging  +---------+---------------+---------+-----------+----------+--------------+  CFV       Full            Yes       Yes                                    +---------+---------------+---------+-----------+----------+--------------+  SFJ       Full                                                             +---------+---------------+---------+-----------+----------+--------------+  FV Prox   Full            Yes       Yes                                    +---------+---------------+---------+-----------+----------+--------------+  FV Mid    Full            Yes       Yes                                    +---------+---------------+---------+-----------+----------+--------------+  FV Distal Full            Yes       Yes                                    +---------+---------------+---------+-----------+----------+--------------+  PFV       Full                                                             +---------+---------------+---------+-----------+----------+--------------+  POP       Full            Yes       Yes                                    +---------+---------------+---------+-----------+----------+--------------+  PTV       Full                                                             +---------+---------------+---------+-----------+----------+--------------+  PERO      Full                                                             +---------+---------------+---------+-----------+----------+--------------+    Summary: BILATERAL: - No evidence of deep vein thrombosis seen in the lower extremities, bilaterally. - No evidence of superficial venous thrombosis in the lower extremities, bilaterally. -No evidence of popliteal cyst, bilaterally. - Subcutaneous edema, bilaterally. - Heterogenous areas in area of groin with significant posterior shadowing seen bilaterally.  *See table(s) above for measurements and observations.     Preliminary     Procedures Procedures  {Document cardiac monitor, telemetry assessment procedure when appropriate:1}  Medications Ordered in ED Medications  vancomycin (VANCOCIN) 2,500 mg in sodium chloride 0.9 % 500 mL IVPB (2,500 mg Intravenous New Bag/Given 01/26/22 1538)  vancomycin (VANCOREADY) IVPB 1750 mg/350 mL (has no administration in time range)  acetaminophen (TYLENOL) tablet 650 mg (has no administration in time range)  aspirin EC tablet 81 mg (has no administration in time range)  atorvastatin (LIPITOR) tablet 10 mg (has no administration in time range)  ARIPiprazole (ABILIFY) tablet 20 mg (has no administration in time range)  buPROPion (WELLBUTRIN) tablet 75 mg (has no administration in time range)  mirtazapine (REMERON) tablet 15 mg (has no administration in time range)  QUEtiapine (SEROQUEL) tablet 50 mg (has no administration in time range)  insulin glargine-yfgn (SEMGLEE) injection 10 Units (has no administration in time range)  polyethylene glycol (MIRALAX / GLYCOLAX) packet 17 g (has no administration in time range)  divalproex (DEPAKOTE ER) 24 hr tablet 500 mg (has no administration in time range)  ondansetron (ZOFRAN) tablet 4 mg (has no administration in time range)    Or  ondansetron (ZOFRAN) injection 4 mg (has no administration in time range)  oxyCODONE (Oxy IR/ROXICODONE) immediate release tablet 5 mg (has no administration in time range)  insulin aspart (novoLOG) injection 0-9 Units (has no administration in time range)  furosemide (LASIX) injection 40 mg (40 mg Intravenous Given 01/26/22 1538)  ceFEPIme (MAXIPIME) 2 g in sodium chloride 0.9 % 100 mL IVPB (has no administration in time range)  metoprolol tartrate (LOPRESSOR) tablet 25 mg (has no administration in time range)  heparin ADULT infusion 100 units/mL (25000 units/229mL) (has no administration in time range)  morphine (PF) 4 MG/ML injection 4 mg (4 mg Intravenous Given 01/26/22 1217)  ondansetron  (ZOFRAN) injection 4 mg (4 mg Intravenous Given 01/26/22 1216)    ED Course/ Medical Decision Making/ A&P                           Medical Decision Making Amount and/or Complexity of Data Reviewed Labs: ordered. ECG/medicine tests: ordered.  Risk Prescription drug management. Decision regarding hospitalization.   ***  {Document critical care time when appropriate:1} {Document review of labs and clinical decision  tools ie heart score, Chads2Vasc2 etc:1}  {Document your independent review of radiology images, and any outside records:1} {Document your discussion with family members, caretakers, and with consultants:1} {Document social determinants of health affecting pt's care:1} {Document your decision making why or why not admission, treatments were needed:1} Final Clinical Impression(s) / ED Diagnoses Final diagnoses:  Cellulitis of right lower extremity  Nonhealing nonsurgical wound  Stage 3b chronic kidney disease ([place])  Acute cystitis without hematuria    Rx / DC Orders ED Discharge Orders     None

## 2022-01-26 NOTE — Progress Notes (Signed)
ANTICOAGULATION CONSULT NOTE - Initial Consult  Pharmacy Consult for heparin Indication: Hx DVT  Allergies  Allergen Reactions   Amlodipine Swelling and Other (See Comments)    Legs became swollen   Onglyza [Saxagliptin] Other (See Comments)    "Allergic," per [place]   Zyprexa [Olanzapine] Other (See Comments)    Allergy noted by ACT Team. No reaction specified (??)- "Allergic," per [place]   Ritalin [Methylphenidate] Anxiety    "Maniac"    Patient Measurements: Height: 6' (182.9 cm) Weight: 121.7 kg (268 lb 6.4 oz) IBW/kg (Calculated) : 77.6 Heparin Dosing Weight: 104kg  Vital Signs: Temp: 98.7 F (37.1 C) (02/28 1011) Temp Source: Oral (02/28 1011) BP: 105/77 (02/28 1323) Pulse Rate: 69 (02/28 1323)  Labs: Recent Labs    01/26/22 1031  HGB 13.7  HCT 46.9  PLT 182  CREATININE 2.01*    Estimated Creatinine Clearance: 47.4 mL/min (A) (by C-G formula based on SCr of 2.01 mg/dL (H)).   Medical History: Past Medical History:  Diagnosis Date   Bilateral lower extremity edema: chronic with venous stasis changes 06/02/2014   Bipolar 1 disorder (HCC)    Bipolar disorder (HCC)    CHF (congestive heart failure) (HCC)    Chronic kidney disease    Gout    Gout    Hx of blood clots    Hypertension    Mood swings    OSA on CPAP 06/02/2014   Other and unspecified hyperlipidemia 06/02/2014   Type II or unspecified type diabetes mellitus with unspecified complication, uncontrolled 06/02/2014    Assessment: 70 YOM presenting with worsening leg wounds, PVD, he is on Xarelto PTA for hx DVT, last dose taken 2/28 @0100   Goal of Therapy:  Heparin level 0.3-0.7 units/ml aPTT 66-102 seconds Monitor platelets by anticoagulation protocol: Yes   Plan:  Heparin gtt at 1500 units/hr, no bolus F/u 6 hour aPTT/HL  Bertis Ruddy, PharmD Clinical Pharmacist ED Pharmacist Phone # [phone] 01/26/2022 1:38 PM

## 2022-01-26 NOTE — Progress Notes (Signed)
Pharmacy Antibiotic Note  Christian [name] is a 71 y.o. male admitted on 01/26/2022 with cellulitis.  Pharmacy has been consulted for vancomycin dosing.  Plan: Vancomycin 2500 mg x1, then 1750 mg q24h (eAUC = 526, SCr used 2.01) Trend WBC, temp, renal function  F/U infectious work-up Drug levels as indicated   Height: 6' (182.9 cm) Weight: 121.7 kg (268 lb 6.4 oz) IBW/kg (Calculated) : 77.6  Temp (24hrs), Avg:98.7 F (37.1 C), Min:98.7 F (37.1 C), Max:98.7 F (37.1 C)  Recent Labs  Lab 01/26/22 1031  WBC 5.1  CREATININE 2.01*  LATICACIDVEN 2.3*    Estimated Creatinine Clearance: 47.4 mL/min (A) (by C-G formula based on SCr of 2.01 mg/dL (H)).    Allergies  Allergen Reactions   Amlodipine Swelling and Other (See Comments)    Legs became swollen   Onglyza [Saxagliptin] Other (See Comments)    "Allergic," per [facility]   Zyprexa [Olanzapine] Other (See Comments)    Allergy noted by ACT Team. No reaction specified (??)- "Allergic," per [facility]   Ritalin [Methylphenidate] Anxiety    "Maniac"    Antimicrobials this admission: Vancomycin 2/28 >> = Ceftriaxone x1  Dose adjustments this admission: None  Microbiology results: Pending   Thank you for allowing pharmacy to be a part of this patients care.  Valeda Malm, Pharm.D. PGY-1 Pharmacy Resident [phone] 01/26/2022 12:08 PM

## 2022-01-26 NOTE — ED Provider Triage Note (Signed)
Emergency Medicine Provider Triage Evaluation Note  Joycie Peek Braggs , a 71 y.o. male  was evaluated in triage.  Pt complains of multiple wounds on the feet for several weeks.  Patient was sent here by Dr. Ardelle Anton with podiatry for wound infection.  He had x-rays done, and podiatrist was concerned for poor wound healing requiring IV antibiotics.  Review of Systems  Positive: Multiple wounds on bilateral feet, worse on right>L Negative: Fevers, chills  Physical Exam  BP 120/74 (BP Location: Left Arm)    Pulse 84    Temp 98.7 F (37.1 C) (Oral)    Resp 16    SpO2 95%  Gen:   Awake, no distress   Resp:  Normal effort  MSK:   Moves extremities without difficulty  Other:  Evaluated images on daughter's phone and podiatry visit - multiple toe wounds on bilateral feet, one round ulcer on the medial right ankle  Medical Decision Making  Medically screening exam initiated at 10:22 AM.  Appropriate orders placed.  Joycie Peek Maser was informed that the remainder of the evaluation will be completed by another provider, this initial triage assessment does not replace that evaluation, and the importance of remaining in the ED until their evaluation is complete.     Aileena Iglesia T, PA-C 01/26/22 1023

## 2022-01-26 NOTE — H&P (Signed)
History and Physical    Christian [name] QZE:[id number] DOB: 05/25/1951 DOA: 01/26/2022  PCP: [name], MD (Confirm with patient/family/NH records and if not entered, this has to be entered at [facility] point of entry) Patient coming from: Home  I have personally briefly reviewed patient's old medical records in [facility]  Chief Complaint: Right leg wound  HPI: Christian [name] is a 71 y.o. male with medical history significant of chronic venous stasis and chronic right shin unhealing wound, chronic HFpEF, IDDM, CAD with stenting x2 in 2021, PVD, CKD stage IIIa, HTN, DVT on Xarelto, bipolar disorder, OSA on CPAP at bedtime, presented with worsening of right leg infection.  Patient developed shallow wound on the right shin area about 2 months ago, gradually getting worse with now a bigger wound, nonhealing.  He denies any pus coming out, she has been doing some local wound care at home with some antibiotic ointment every day, denies any fever or chills.  Today, he went to see podiatrist who suspect patient has underlying worsening of PVD requesting inpatient vascular consultation and sent patient to ED.  He also has several smaller wounds/ulcer on the right foot and toes.  He used to take Lasix for leg swelling however recently switched to torsemide twice daily for worsening of leg swelling.  ED Course: Afebrile, no tachycardia no hypotension.  WBC 5.1, creatinine 2.0, K4.4.  UA 11-20 WBC  Patient was started on vancomycin and ceftriaxone.  Review of Systems: As per HPI otherwise 14 point review of systems negative.    Past Medical History:  Diagnosis Date   Bilateral lower extremity edema: chronic with venous stasis changes 06/02/2014   Bipolar 1 disorder (HCC)    Bipolar disorder (HCC)    CHF (congestive heart failure) (HCC)    Chronic kidney disease    Gout    Gout    Hx of blood clots    Hypertension    Mood swings    OSA on CPAP 06/02/2014   Other and unspecified  hyperlipidemia 06/02/2014   Type II or unspecified type diabetes mellitus with unspecified complication, uncontrolled 06/02/2014    Past Surgical History:  Procedure Laterality Date   ABDOMINAL AORTOGRAM W/LOWER EXTREMITY N/A 09/16/2021   Procedure: ABDOMINAL AORTOGRAM W/LOWER EXTREMITY;  Surgeon: Victorino Sparrow, MD;  Location[facility] INVASIVE CV LAB;  Service: Cardiovascular;  Laterality: N/A;   LEFT HEART CATH AND CORONARY ANGIOGRAPHY Left 04/01/2020   Procedure: LEFT HEART CATH AND CORONARY ANGIOGRAPHY;  Surgeon: Laurier Nancy, MD;  Location: [facility] INVASIVE CV LAB;  Service: Cardiovascular;  Laterality: Left;   PERIPHERAL VASCULAR BALLOON ANGIOPLASTY Left 09/16/2021   Procedure: PERIPHERAL VASCULAR BALLOON ANGIOPLASTY;  Surgeon: Victorino Sparrow, MD;  Location: [facility] INVASIVE CV LAB;  Service: Cardiovascular;  Laterality: Left;  attempted to PTA posterior tibial artery; unable to cross lesion     reports that he has never smoked. He has never used smokeless tobacco. He reports that he does not drink alcohol and does not use drugs.  Allergies  Allergen Reactions   Amlodipine Swelling and Other (See Comments)    Legs became swollen   Onglyza [Saxagliptin] Other (See Comments)    "Allergic," per [facility]   Zyprexa [Olanzapine] Other (See Comments)    Allergy noted by ACT Team. No reaction specified (??)- "Allergic," per [facility]   Ritalin [Methylphenidate] Anxiety    "Maniac"    Family History  Problem Relation Age of Onset   Dementia Mother  Arthritis Father    Hypertension Father    Mental illness Sister    Diabetes Sister    Heart failure Neg Hx    Kidney failure Neg Hx    Cancer Neg Hx      Prior to Admission medications   Medication Sig Start Date End Date Taking? Authorizing Provider  acetaminophen (TYLENOL) 500 MG tablet Take 500-1,000 mg by mouth every 6 (six) hours as needed (pain.).   Yes [provider]  ARIPiprazole (ABILIFY) 20 MG tablet  Take 1 tablet (20 mg total) by mouth daily. 02/06/20  Yes Starkes-Perry, Juel Burrow, FNP  aspirin EC 81 MG tablet Take 81 mg by mouth daily. Swallow whole.   Yes [provider]  atorvastatin (LIPITOR) 10 MG tablet Take 1 tablet (10 mg total) by mouth daily at 6 PM. Patient taking differently: Take 10 mg by mouth daily. 02/05/20  Yes Starkes-Perry, Juel Burrow, FNP  buPROPion (WELLBUTRIN) 75 MG tablet Take 1 tablet (75 mg total) by mouth daily. 02/06/20  Yes Starkes-Perry, Juel Burrow, FNP  divalproex (DEPAKOTE ER) 500 MG 24 hr tablet Take 500 mg by mouth in the morning and at bedtime.   Yes [provider]  empagliflozin (JARDIANCE) 10 MG TABS tablet Take 10 mg by mouth daily.  06/11/20  Yes [provider]  glimepiride (AMARYL) 2 MG tablet Take 2 mg by mouth daily.   Yes [provider]  insulin glargine (LANTUS) 100 UNIT/ML injection Inject 10 Units into the skin daily.   Yes [provider]  liothyronine (CYTOMEL) 5 MCG tablet Take 10 mcg by mouth daily.   Yes [provider]  metoprolol succinate (TOPROL-XL) 50 MG 24 hr tablet Take 50 mg by mouth daily. Take with or immediately following a meal.   Yes [provider]  mirtazapine (REMERON) 15 MG tablet Take 15 mg by mouth at bedtime. 05/01/21  Yes [provider]  mupirocin ointment (BACTROBAN) 2 % APPLY 1 APPLICATION TOPICALLY DAILY. APPLIED TO TOE WOUND Patient taking differently: Apply 1 application topically See admin instructions. 1 application daily to toe wound 12/16/21  Yes Vivi Barrack, DPM  polyethylene glycol powder (GLYCOLAX/MIRALAX) 17 GM/SCOOP powder Take 17 g by mouth daily. Patient taking differently: Take 17 g by mouth daily as needed for mild constipation. 10/14/20  Yes Ghimire, Werner Lean, MD  QUEtiapine (SEROQUEL) 50 MG tablet Take 50 mg by mouth at bedtime.   Yes [provider]  rivaroxaban (XARELTO) 20 MG TABS tablet Take 1 tablet (20 mg total) by mouth  daily with supper. Patient taking differently: Take 20 mg by mouth at bedtime. 04/02/20  Yes Dhungel, Nishant, MD  furosemide (LASIX) 40 MG tablet Take 1 tablet (40 mg total) by mouth daily. Patient not taking: Reported on 01/26/2022 04/06/21   Burnadette Pop, MD  metoprolol succinate (TOPROL-XL) 25 MG 24 hr tablet Take 2 tablets (50 mg total) by mouth daily. Take with or immediately following a meal. Patient not taking: Reported on 01/26/2022 04/06/21   Burnadette Pop, MD    Physical Exam: Vitals:   01/26/22 1200 01/26/22 1312 01/26/22 1313 01/26/22 1323  BP:  112/78  105/77  Pulse:   73 69  Resp:    16  Temp:      TempSrc:      SpO2:   100% 99%  Weight: 121.7 kg     Height: 6' (1.829 m)       Constitutional: NAD, calm, comfortable Vitals:   01/26/22 1200  01/26/22 1312 01/26/22 1313 01/26/22 1323  BP:  112/78  105/77  Pulse:   73 69  Resp:    16  Temp:      TempSrc:      SpO2:   100% 99%  Weight: 121.7 kg     Height: 6' (1.829 m)      Eyes: PERRL, lids and conjunctivae normal ENMT: Mucous membranes are moist. Posterior pharynx clear of any exudate or lesions.Normal dentition.  Neck: normal, supple, no masses, no thyromegaly Respiratory: clear to auscultation bilaterally, no wheezing, no crackles. Normal respiratory effort. No accessory muscle use.  Cardiovascular: Regular rate and rhythm, no murmurs / rubs / gallops. 2+ extremity edema. 2+ pedal pulses. No carotid bruits.  Abdomen: no tenderness, no masses palpated. No hepatosplenomegaly. Bowel sounds positive.  Musculoskeletal: no clubbing / cyanosis. No joint deformity upper and lower extremities. Good ROM, no contractures. Normal muscle tone.  Skin: .  Shallow ulcer on right medial side of shin 5x3 cm with regular border, thin discharge Neurologic: CN 2-12 grossly intact. Sensation intact, DTR normal. Strength 5/5 in all 4.  Psychiatric: Normal judgment and insight. Alert and oriented x 3. Normal mood.     Labs on  Admission: I have personally reviewed following labs and imaging studies  CBC: Recent Labs  Lab 01/26/22 1031  WBC 5.1  NEUTROABS 3.0  HGB 13.7  HCT 46.9  MCV 84.4  PLT 182   Basic Metabolic Panel: Recent Labs  Lab 01/26/22 1031  NA 134*  K 4.4  CL 102  CO2 20*  GLUCOSE 155*  BUN 37*  CREATININE 2.01*  CALCIUM 8.8*   GFR: Estimated Creatinine Clearance: 47.4 mL/min (A) (by C-G formula based on SCr of 2.01 mg/dL (H)). Liver Function Tests: Recent Labs  Lab 01/26/22 1031  AST 25  ALT 42  ALKPHOS 64  BILITOT 0.6  PROT 6.8  ALBUMIN 3.2*   No results for input(s): LIPASE, AMYLASE in the last 168 hours. No results for input(s): AMMONIA in the last 168 hours. Coagulation Profile: No results for input(s): INR, PROTIME in the last 168 hours. Cardiac Enzymes: No results for input(s): CKTOTAL, CKMB, CKMBINDEX, TROPONINI in the last 168 hours. BNP (last 3 results) No results for input(s): PROBNP in the last 8760 hours. HbA1C: No results for input(s): HGBA1C in the last 72 hours. CBG: No results for input(s): GLUCAP in the last 168 hours. Lipid Profile: No results for input(s): CHOL, HDL, LDLCALC, TRIG, CHOLHDL, LDLDIRECT in the last 72 hours. Thyroid Function Tests: No results for input(s): TSH, T4TOTAL, FREET4, T3FREE, THYROIDAB in the last 72 hours. Anemia Panel: No results for input(s): VITAMINB12, FOLATE, FERRITIN, TIBC, IRON, RETICCTPCT in the last 72 hours. Urine analysis:    Component Value Date/Time   COLORURINE YELLOW 01/26/2022 1022   APPEARANCEUR CLEAR 01/26/2022 1022   APPEARANCEUR Cloudy 08/01/2014 1614   LABSPEC 1.011 01/26/2022 1022   LABSPEC 1.008 08/01/2014 1614   PHURINE 5.0 01/26/2022 1022   GLUCOSEU >=500 (A) 01/26/2022 1022   GLUCOSEU Negative 08/01/2014 1614   HGBUR NEGATIVE 01/26/2022 1022   BILIRUBINUR NEGATIVE 01/26/2022 1022   BILIRUBINUR Negative 08/01/2014 1614   KETONESUR NEGATIVE 01/26/2022 1022   PROTEINUR NEGATIVE 01/26/2022  1022   UROBILINOGEN 0.2 07/01/2014 1809   NITRITE POSITIVE (A) 01/26/2022 1022   LEUKOCYTESUR MODERATE (A) 01/26/2022 1022   LEUKOCYTESUR Negative 08/01/2014 1614    Radiological Exams on Admission: No results found.  EKG: Independently reviewed.  Sinus tachycardia.  Assessment/Plan Principal Problem:  Diabetic ulcer of ankle (HCC) Active Problems:   Cellulitis in diabetic foot (HCC)  (please populate well all problems here in Problem List. (For example, if patient is on BP meds at home and you resume or decide to hold them, it is a problem that needs to be her. Same for CAD, COPD, HLD and so on)  Right shin diabetic wound infection -Change coverage to vancomycin and cefepime -MRSA screen -Vascular surgery consult appreciated  Acute on chronic HFpEF decompensation -Change diuresis to Lasix IV 40 mg daily -Monitor volume status  PVD -As per vascular surgery consult.  IDDM -Continue Lantus 10 units daily -Sliding scale with renal dosed  HTN -Continue metoprolol  CKD stage IIIa -Creatinine level stable, symptoms signs of fluid overload, change p.o. torsemide to IV Lasix as above.  Hx of DVT -Hold Xarelto, start heparin drip  History of bipolar -Mentation stable, continue Abilify and Depakote  CAD -No acute issue  OSA -CPAP at bedtime  DVT prophylaxis: Heparin drip Code Status: Full code Family Communication: Daughter at bedside Disposition Plan: Expect more than 2 midnight hospital stay, expected vascular surgery intervention. Consults called: Vascular surgery Admission status: MedSurg admission   Emeline General MD [facility] Pager [phone]  01/26/2022, 1:29 PM

## 2022-01-26 NOTE — ED Triage Notes (Signed)
Wife/pt stated, pt sent here from a podiatrist today for infection on both lower legs and feet. Started 2 weeks ago. Pt a diabetic.

## 2022-01-27 ENCOUNTER — Encounter (HOSPITAL_COMMUNITY): Payer: Self-pay | Admitting: Internal Medicine

## 2022-01-27 DIAGNOSIS — L03119 Cellulitis of unspecified part of limb: Secondary | ICD-10-CM

## 2022-01-27 DIAGNOSIS — E11628 Type 2 diabetes mellitus with other skin complications: Secondary | ICD-10-CM

## 2022-01-27 DIAGNOSIS — L97309 Non-pressure chronic ulcer of unspecified ankle with unspecified severity: Secondary | ICD-10-CM | POA: Diagnosis not present

## 2022-01-27 DIAGNOSIS — L97522 Non-pressure chronic ulcer of other part of left foot with fat layer exposed: Secondary | ICD-10-CM | POA: Diagnosis not present

## 2022-01-27 DIAGNOSIS — L97521 Non-pressure chronic ulcer of other part of left foot limited to breakdown of skin: Secondary | ICD-10-CM | POA: Diagnosis not present

## 2022-01-27 DIAGNOSIS — E11622 Type 2 diabetes mellitus with other skin ulcer: Secondary | ICD-10-CM | POA: Diagnosis not present

## 2022-01-27 DIAGNOSIS — T148XXA Other injury of unspecified body region, initial encounter: Secondary | ICD-10-CM

## 2022-01-27 DIAGNOSIS — N1832 Chronic kidney disease, stage 3b: Secondary | ICD-10-CM

## 2022-01-27 DIAGNOSIS — I5033 Acute on chronic diastolic (congestive) heart failure: Secondary | ICD-10-CM

## 2022-01-27 LAB — CBC
HCT: 40.9 % (ref 39.0–52.0)
Hemoglobin: 12.5 g/dL — ABNORMAL LOW (ref 13.0–17.0)
MCH: 25.1 pg — ABNORMAL LOW (ref 26.0–34.0)
MCHC: 30.6 g/dL (ref 30.0–36.0)
MCV: 82 fL (ref 80.0–100.0)
Platelets: 158 10*3/uL (ref 150–400)
RBC: 4.99 MIL/uL (ref 4.22–5.81)
RDW: 17.4 % — ABNORMAL HIGH (ref 11.5–15.5)
WBC: 4.4 10*3/uL (ref 4.0–10.5)
nRBC: 0 % (ref 0.0–0.2)

## 2022-01-27 LAB — BASIC METABOLIC PANEL
Anion gap: 5 (ref 5–15)
BUN: 32 mg/dL — ABNORMAL HIGH (ref 8–23)
CO2: 27 mmol/L (ref 22–32)
Calcium: 8.4 mg/dL — ABNORMAL LOW (ref 8.9–10.3)
Chloride: 105 mmol/L (ref 98–111)
Creatinine, Ser: 1.87 mg/dL — ABNORMAL HIGH (ref 0.61–1.24)
GFR, Estimated: 39 mL/min — ABNORMAL LOW (ref >60)
Glucose, Bld: 94 mg/dL (ref 70–99)
Potassium: 4.3 mmol/L (ref 3.5–5.1)
Sodium: 137 mmol/L (ref 135–145)

## 2022-01-27 LAB — CBG MONITORING, ED
Glucose-Capillary: 91 mg/dL (ref 70–99)
Glucose-Capillary: 64 mg/dL — ABNORMAL LOW (ref 70–99)
Glucose-Capillary: 83 mg/dL (ref 70–99)

## 2022-01-27 LAB — APTT
aPTT: >200 seconds (ref 24–36)
aPTT: 66 seconds — ABNORMAL HIGH (ref 24–36)

## 2022-01-27 LAB — HEPARIN LEVEL (UNFRACTIONATED): Heparin Unfractionated: >1.1 IU/mL — ABNORMAL HIGH (ref 0.30–0.70)

## 2022-01-27 LAB — GLUCOSE, CAPILLARY
Glucose-Capillary: 148 mg/dL — ABNORMAL HIGH (ref 70–99)
Glucose-Capillary: 152 mg/dL — ABNORMAL HIGH (ref 70–99)
Glucose-Capillary: 165 mg/dL — ABNORMAL HIGH (ref 70–99)

## 2022-01-27 MED ORDER — RIVAROXABAN 20 MG PO TABS: 20.0000 mg | ORAL_TABLET | Freq: Every day | ORAL | Status: DC

## 2022-01-27 MED ORDER — RIVAROXABAN 20 MG PO TABS
20.0000 mg | ORAL_TABLET | Freq: Every day | ORAL | Status: DC
  Administered 2022-01-27 – 2022-01-29 (×3): 20 mg via ORAL
  Filled 2022-01-27 (×3): qty 1

## 2022-01-27 MED ORDER — FUROSEMIDE 10 MG/ML IJ SOLN: 20.0000 mg | Freq: Two times a day (BID) | INTRAMUSCULAR | Status: DC

## 2022-01-27 MED ORDER — FUROSEMIDE 10 MG/ML IJ SOLN
20.0000 mg | Freq: Every day | INTRAMUSCULAR | Status: DC
  Administered 2022-01-27 – 2022-01-29 (×3): 20 mg via INTRAVENOUS
  Filled 2022-01-27 (×3): qty 2

## 2022-01-27 MED ORDER — LIOTHYRONINE SODIUM 5 MCG PO TABS
10.0000 ug | ORAL_TABLET | Freq: Every day | ORAL | Status: DC
  Administered 2022-01-27 – 2022-01-29 (×3): 10 ug via ORAL
  Filled 2022-01-27 (×3): qty 2

## 2022-01-27 MED ORDER — DOXYCYCLINE HYCLATE 100 MG PO TABS
100.0000 mg | ORAL_TABLET | Freq: Two times a day (BID) | ORAL | Status: DC
  Administered 2022-01-28 – 2022-01-29 (×3): 100 mg via ORAL
  Filled 2022-01-27 (×3): qty 1

## 2022-01-27 NOTE — Progress Notes (Signed)
[facility] PROGRESS NOTE    Progress Note  Christian [name]  [id number] DOB: [birth year] DOA: 01/26/2022 PCP: Donald Prose, MD     Brief Narrative:   Christian [name] is an 71 y.o. male past medical history significant for chronic venous stasis, chronic right shin and heel ulcer, chronic preserved heart failure, insulin-dependent diabetes mellitus, CAD with stenting x2 in 2021, PVD, chronic kidney disease stage IIIa, DVT on Xarelto bipolar disorder obstructive sleep apnea presents with worsening right leg infection, vascular was consulted recommended not to repeat an angiogram or bypass surgical intervention, recommended elevation of the legs and compression but due to his peripheral arterial disease will recommend mild elevation of the legs with mild compression.  We will follow-up with them as an outpatient.  Podiatrist was consulted local wound care with antibiotics.  Weightbearing as tolerated, unfortunately he is high risk for major amputation.     Assessment/Plan:   Right shin  Diabetic ulcer: Started on IV vancomycin and cefepime MRSA screening is pending. Vascular surgery recommended no surgical intervention as well as podiatrist. Tmax of 98.7, with no leukocytosis. Consult wound care will transition to oral antibiotics tomorrow morning. We will get TOC to arrange home health. Keep the legs elevated Discontinue IV heparin, transition to her home dose of Xarelto.  Acute on chronic decompensated diastolic heart failure: Appears to be volume overloaded on physical exam we will start her on IV Lasix, his baseline creatinine is around 1.7-1.8, will mild diuresis his creatinine has returned to baseline. Continue IV diuresis for an additional 24 hours, keep legs elevated  Acute on chronic kidney disease stage IIIb: Creatinine improved with IV diuresis.  Peripheral vascular disease: Continue aspirin no surgical intervention per vascular.  Insulin-dependent diabetes  mellitus type 2: He was started  sliding scale blood glucose seems fairly controlled.  History of DVT:  resume Xarelto.  Sacral decubitus ulcer in the buttock stage II present on admission: RN Pressure Injury Documentation: Pressure Injury 08/04/18 Stage II -  Partial thickness loss of dermis presenting as a shallow open ulcer with a red, pink wound bed without slough. (Active)  08/04/18 2253  Location: Buttocks  Location Orientation: Left;Right  Staging: Stage II -  Partial thickness loss of dermis presenting as a shallow open ulcer with a red, pink wound bed without slough.  Wound Description (Comments):   Present on Admission: Yes     DVT prophylaxis: lovenox Family Communication:none Status is: Inpatient Remains inpatient appropriate because: Infected right shin ulcer     Code Status:     Code Status Orders  (From admission, onward)           Start     Ordered   01/26/22 1327  Full code  Continuous        01/26/22 1327           Code Status History     Date Active Date Inactive Code Status Order ID Comments User Context   09/16/2021 1656 09/16/2021 2337 Full Code [id number]  Broadus John, MD Inpatient   04/02/2021 0432 04/07/2021 0005 Full Code [id number]  Renee Pain, MD ED   10/07/2020 1839 10/17/2020 0026 Full Code [id number]  Lequita Halt, MD ED   03/31/2020 1153 04/01/2020 2332 Full Code [id number]  Collier Bullock, MD ED   03/17/2020 1548 03/21/2020 1755 Full Code [id number]  Collier Bullock, MD Inpatient   01/30/2020 1857 02/05/2020 2106 Full Code [id number]  Gonzella Lex, MD Inpatient   10/31/2019 1840 11/02/2019  [place] Full Code [id number]  Deno Etienne, DO ED   08/01/2018 1754 08/04/2018 1200 Full Code [id number]  Blanchie Dessert, MD ED   06/17/2018 1700 06/23/2018 1808 Full Code [id number]  Lady Deutscher, MD ED   03/27/2018 0325 03/31/2018 1753 Full Code [id number]  Rise Patience, MD Inpatient   12/22/2015 1337 02/03/2016 1950 Full Code [id number]  Gonzella Lex, MD Inpatient   12/22/2015 0830 12/22/2015 1337 Full Code [id number]  Gonzella Lex, MD ED   09/05/2015 2043 09/18/2015 1817 Full Code [id number]  Clapacs, Madie Reno, MD Inpatient   08/31/2015 1702 09/05/2015 2030 Full Code [id number]  Baxter Hire, MD Inpatient   08/28/2015 2019 08/31/2015 1702 Full Code [id number]  Gonzella Lex, MD Inpatient   07/29/2015 2028 08/27/2015 1932 Full Code [id number]  [name], MD Inpatient   07/16/2015 1213 07/29/2015 2028 Full Code [id number]  Clovis Fredrickson, MD Inpatient   07/15/2015 1751 07/16/2015 1213 Full Code [id number]  Dewain Penning, MD Inpatient   07/14/2015 1913 07/15/2015 1751 Full Code [id number]  Dewain Penning, MD Inpatient   07/11/2015 1703 07/14/2015 1913 Full Code [id number]  Vaughan Basta, MD Inpatient   07/09/2015 2313 07/11/2015 1703 Full Code [id number]  Maris Berger, RN Inpatient   06/28/2015 2306 06/30/2015 1134 Full Code [id number]  Muthersbaugh, Gwenlyn Perking ED   06/21/2014 1533 07/16/2014 1824 Full Code [id number]  Elmarie Shiley, NP Inpatient   06/19/2014 1454 06/21/2014 1533 Full Code [id number]  Velvet Bathe, MD Inpatient   06/17/2014 0545 06/19/2014 1454 Full Code [id number]  Artis Delay, MD ED   05/25/2014 0455 06/05/2014 1827 Full Code [id number]  Lurena Nida, NP Inpatient   05/24/2014 0408 05/25/2014 0455 Full Code [id number]  Martie Lee, PA-C ED         IV Access:   Peripheral IV   Procedures and diagnostic studies:   DG Chest Port 1 View  Result Date: 01/26/2022 CLINICAL DATA:  Congestive heart failure. EXAM: PORTABLE CHEST 1 VIEW COMPARISON:  Apr 02, 2021. FINDINGS: The heart size and mediastinal contours are within normal limits. Both lungs are clear. The visualized skeletal structures are unremarkable. IMPRESSION: No active disease. Electronically Signed   By: Marijo Conception M.D.   On: 01/26/2022 15:44   DG Foot Complete Right  Result Date: 01/26/2022 Please see detailed radiograph report in office  note.  DG Foot Complete Right  Result Date: 01/26/2022 Please see detailed radiograph report in office note.  VAS [place] ABI WITH/WO TBI  Result Date: 01/26/2022  LOWER EXTREMITY DOPPLER STUDY Patient Name:  Christian [name]  Date of Exam:   01/26/2022 Medical Rec #: [id number]         Accession #:    [id number] Date of Birth: 11-21-1951         Patient Gender: M Patient Age:   62 years Exam Location:  [facility] Procedure:      VAS [place] ABI WITH/WO TBI Referring Phys: JULIE HAVILAND --------------------------------------------------------------------------------  Indications: Ulceration, and peripheral artery disease. High Risk Factors: Hypertension, Diabetes, no history of smoking, prior MI,                    coronary artery disease, prior CVA. Other Factors: CHF, CKD3, CABG.  Vascular Interventions: LLE angiogram and attempted PTA balloon angioplasty -                         09/16/21. Comparison Study:  Previous exam 08/24/21 Performing Technologist: Rogelia Rohrer RVT, RDMS  Examination Guidelines: A complete evaluation includes at minimum, Doppler waveform signals and systolic blood pressure reading at the level of bilateral brachial, anterior tibial, and posterior tibial arteries, when vessel segments are accessible. Bilateral testing is considered an integral part of a complete examination. Photoelectric Plethysmograph (PPG) waveforms and toe systolic pressure readings are included as required and additional duplex testing as needed. Limited examinations for reoccurring indications may be performed as noted.  ABI Findings: +---------+------------------+-----+-------------------+--------+  Right     Rt Pressure (mmHg) Index Waveform            Comment   +---------+------------------+-----+-------------------+--------+  Brachial  124                      triphasic                     +---------+------------------+-----+-------------------+--------+  PTA       132                1.02  dampened monophasic            +---------+------------------+-----+-------------------+--------+  DP        92                 0.71  dampened monophasic           +---------+------------------+-----+-------------------+--------+  Great Toe 32                 0.25  Abnormal            severe    +---------+------------------+-----+-------------------+--------+ +---------+------------------+-----+----------+---------------+  Left      Lt Pressure (mmHg) Index Waveform   Comment          +---------+------------------+-----+----------+---------------+  Brachial  129                      triphasic                   +---------+------------------+-----+----------+---------------+  PTA       121                0.94  monophasic                  +---------+------------------+-----+----------+---------------+  DP        111                0.86  monophasic                  +---------+------------------+-----+----------+---------------+  Great Toe 40                 0.31  Abnormal   moderate-severe  +---------+------------------+-----+----------+---------------+ +-------+-----------+-----------+------------+------------+  ABI/TBI Today's ABI Today's TBI Previous ABI Previous TBI  +-------+-----------+-----------+------------+------------+  Right   1.02        0.25        1.08         0.36          +-------+-----------+-----------+------------+------------+  Left    0.94        0.31        0.85         0.39          +-------+-----------+-----------+------------+------------+  Summary: Right: The right toe-brachial index is abnormal (severe disease). 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 Left: The left  toe-brachial index is abnormal (moderate/severe disease). 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  *See  table(s) above for measurements and observations.  Electronically signed by Deitra Mayo MD on 01/26/2022 at 52:18:38 PM.    Final    VAS [place] LOWER EXTREMITY VENOUS (DVT) (ONLY [facility] & [facility])  Result Date: 01/26/2022  Lower Venous DVT Study Patient Name:  Christian [name]  Date of Exam:   01/26/2022 Medical Rec #: [id number]         Accession #:    [id number] Date of Birth: [birth year]         Patient Gender: M Patient Age:   56 years Exam Location:  [facility] Procedure:      VAS [place] LOWER EXTREMITY VENOUS (DVT) Referring Phys: JULIE HAVILAND --------------------------------------------------------------------------------  Indications: Edema.  Risk Factors: HX of PE DVT HX (BLE). Anticoagulation: Xarelto. Limitations: Body habitus, poor ultrasound/tissue interface and shadowing. Comparison Study: Previous exam on 03/18/2020 was negative for DVT. Performing Technologist: Rogelia Rohrer RVT, RDMS  Examination Guidelines: A complete evaluation includes B-mode imaging, spectral Doppler, color Doppler, and power Doppler as needed of all accessible portions of each vessel. Bilateral testing is considered an integral part of a complete examination. Limited examinations for reoccurring indications may be performed as noted. The reflux portion of the exam is performed with the patient in reverse Trendelenburg.  +---------+---------------+---------+-----------+----------+--------------+  RIGHT     Compressibility Phasicity Spontaneity Properties Thrombus Aging  +---------+---------------+---------+-----------+----------+--------------+  CFV       Full            Yes       Yes                                    +---------+---------------+---------+-----------+----------+--------------+  SFJ       Full                                                             +---------+---------------+---------+-----------+----------+--------------+  FV Prox   Full            Yes       Yes                                     +---------+---------------+---------+-----------+----------+--------------+  FV Mid    Full            Yes       Yes                                    +---------+---------------+---------+-----------+----------+--------------+  FV Distal Full            Yes       Yes                                    +---------+---------------+---------+-----------+----------+--------------+  PFV       Full                                                             +---------+---------------+---------+-----------+----------+--------------+  POP       Full            Yes       Yes                                    +---------+---------------+---------+-----------+----------+--------------+  PTV       Full                                                             +---------+---------------+---------+-----------+----------+--------------+  PERO      Full                                                             +---------+---------------+---------+-----------+----------+--------------+   +---------+---------------+---------+-----------+----------+--------------+  LEFT      Compressibility Phasicity Spontaneity Properties Thrombus Aging  +---------+---------------+---------+-----------+----------+--------------+  CFV       Full            Yes       Yes                                    +---------+---------------+---------+-----------+----------+--------------+  SFJ       Full                                                             +---------+---------------+---------+-----------+----------+--------------+  FV Prox   Full            Yes       Yes                                    +---------+---------------+---------+-----------+----------+--------------+  FV Mid    Full            Yes       Yes                                    +---------+---------------+---------+-----------+----------+--------------+  FV Distal Full            Yes       Yes                                     +---------+---------------+---------+-----------+----------+--------------+  PFV       Full                                                             +---------+---------------+---------+-----------+----------+--------------+  POP       Full            Yes       Yes                                    +---------+---------------+---------+-----------+----------+--------------+  PTV       Full                                                             +---------+---------------+---------+-----------+----------+--------------+  PERO      Full                                                             +---------+---------------+---------+-----------+----------+--------------+     Summary: BILATERAL: - No evidence of deep vein thrombosis seen in the lower extremities, bilaterally. - No evidence of superficial venous thrombosis in the lower extremities, bilaterally. -No evidence of popliteal cyst, bilaterally. - Subcutaneous edema, bilaterally. - Heterogenous areas in area of groin with significant posterior shadowing seen bilaterally.  *See table(s) above for measurements and observations. Electronically signed by Deitra Mayo MD on 01/26/2022 at 6:19:08 PM.    Final      Medical Consultants:   None.   Subjective:    Christian [name] has no complaint has remained afebrile.  Objective:    Vitals:   01/26/22 1715 01/26/22 2200 01/27/22 0315 01/27/22 0645  BP: 93/64 140/74 111/70 126/80  Pulse: 83 94 72 85  Resp: 17 (!) 22 15 12   Temp:      TempSrc:      SpO2: 91% 92% 92% 98%  Weight:      Height:       SpO2: 98 %   Intake/Output Summary (Last 24 hours) at 01/27/2022 0828 Last data filed at 01/26/2022 2220 Gross per 24 hour  Intake 750 ml  Output 600 ml  Net 150 ml   Filed Weights   01/26/22 1200  Weight: 121.7 kg    Exam: General exam: In no acute distress. Respiratory system: Good air movement and clear to auscultation. Cardiovascular system: S1 & S2 heard, RRR. No  JVD,. Gastrointestinal system: Abdomen is nondistended, soft and nontender.  Central nervous system: Alert and oriented. No focal neurological deficits. Extremities: 3+ lower extremity edema, with chronic venous stasis changes Skin: Right shin ulcer at this not have purulent drainage Psychiatry: Judgement and insight appear normal. Mood & affect appropriate.    Data Reviewed:    Labs: Basic Metabolic Panel: Recent Labs  Lab 01/26/22 1031 01/27/22 0452  NA 134* 137  K 4.4 4.3  CL 102 105  CO2 20* 27  GLUCOSE 155* 94  BUN 37* 32*  CREATININE 2.01* 1.87*  CALCIUM 8.8* 8.4*   GFR Estimated Creatinine Clearance: 50.9 mL/min (A) (by C-G formula based on SCr of 1.87 mg/dL (H)). Liver Function Tests: Recent Labs  Lab 01/26/22 1031  AST 25  ALT 42  ALKPHOS 64  BILITOT 0.6  PROT 6.8  ALBUMIN 3.2*   No results for input(s):  LIPASE, AMYLASE in the last 168 hours. No results for input(s): AMMONIA in the last 168 hours. Coagulation profile No results for input(s): INR, PROTIME in the last 168 hours. COVID-19 Labs  No results for input(s): DDIMER, FERRITIN, LDH, CRP in the last 72 hours.  Lab Results  Component Value Date   SARSCOV2NAA NEGATIVE 01/26/2022   SARSCOV2NAA NEGATIVE 04/06/2021   [place] NEGATIVE 04/01/2021   [place] NEGATIVE 10/13/2020    CBC: Recent Labs  Lab 01/26/22 1031 01/27/22 0452  WBC 5.1 4.4  NEUTROABS 3.0  --   HGB 13.7 12.5*  HCT 46.9 40.9  MCV 84.4 82.0  PLT 182 158   Cardiac Enzymes: No results for input(s): CKTOTAL, CKMB, CKMBINDEX, TROPONINI in the last 168 hours. BNP (last 3 results) No results for input(s): PROBNP in the last 8760 hours. CBG: Recent Labs  Lab 01/26/22 1530 01/26/22 1550 01/26/22 1640 01/26/22 2141 01/27/22 0723  GLUCAP 59* 65* 105* 154* 91   D-Dimer: No results for input(s): DDIMER in the last 72 hours. Hgb A1c: No results for input(s): HGBA1C in the last 72 hours. Lipid Profile: No results  for input(s): CHOL, HDL, LDLCALC, TRIG, CHOLHDL, LDLDIRECT in the last 72 hours. Thyroid function studies: No results for input(s): TSH, T4TOTAL, T3FREE, THYROIDAB in the last 72 hours.  Invalid input(s): FREET3 Anemia work up: No results for input(s): VITAMINB12, FOLATE, FERRITIN, TIBC, IRON, RETICCTPCT in the last 72 hours. Sepsis Labs: Recent Labs  Lab 01/26/22 1031 01/26/22 1229 01/27/22 0452  WBC 5.1  --  4.4  LATICACIDVEN 2.3* 1.5  --    Microbiology Recent Results (from the past 240 hour(s))  Resp Panel by RT-PCR (Flu A&B, Covid)     Status: None   Collection Time: 01/26/22 11:57 AM   Specimen: Nasopharyngeal(NP) swabs in vial transport medium  Result Value Ref Range Status   SARS Coronavirus 2 by RT PCR NEGATIVE NEGATIVE Final    Comment: (NOTE) SARS-CoV-2 target nucleic acids are NOT DETECTED.  The SARS-CoV-2 RNA is generally detectable in upper respiratory specimens during the acute phase of infection. The lowest concentration of SARS-CoV-2 viral copies this assay can detect is 138 copies/mL. A negative result does not preclude SARS-Cov-2 infection and should not be used as the sole basis for treatment or other patient management decisions. A negative result may occur with  improper specimen collection/handling, submission of specimen other than nasopharyngeal swab, presence of viral mutation(s) within the areas targeted by this assay, and inadequate number of viral copies(<138 copies/mL). A negative result must be combined with clinical observations, patient history, and epidemiological information. The expected result is Negative.  Fact Sheet for Patients:  EntrepreneurPulse.com.au  Fact Sheet for Healthcare Providers:  IncredibleEmployment.be  This test is no t yet approved or cleared by the [place] FDA and  has been authorized for detection and/or diagnosis of SARS-CoV-2 by FDA under an Emergency Use Authorization  (EUA). This EUA will remain  in effect (meaning this test can be used) for the duration of the COVID-19 declaration under Section 564(b)(1) of the Act, 21 U.S.C.section 360bbb-3(b)(1), unless the authorization is terminated  or revoked sooner.       Influenza A by PCR NEGATIVE NEGATIVE Final   Influenza B by PCR NEGATIVE NEGATIVE Final    Comment: (NOTE) The Xpert Xpress SARS-CoV-2/FLU/RSV plus assay is intended as an aid in the diagnosis of influenza from Nasopharyngeal swab specimens and should not be used as a sole basis for treatment. Nasal washings and aspirates are  unacceptable for Xpert Xpress SARS-CoV-2/FLU/RSV testing.  Fact Sheet for Patients: EntrepreneurPulse.com.au  Fact Sheet for Healthcare Providers: IncredibleEmployment.be  This test is not yet approved or cleared by the [place] FDA and has been authorized for detection and/or diagnosis of SARS-CoV-2 by FDA under an Emergency Use Authorization (EUA). This EUA will remain in effect (meaning this test can be used) for the duration of the COVID-19 declaration under Section 564(b)(1) of the Act, 21 U.S.C. section 360bbb-3(b)(1), unless the authorization is terminated or revoked.  Performed at [facility], [place] [address] [place], [place] [address]      Medications:    ARIPiprazole  20 mg Oral Daily   aspirin EC  81 mg Oral Daily   atorvastatin  10 mg Oral q1800   buPROPion  75 mg Oral Daily   divalproex  500 mg Oral Daily   furosemide  20 mg Intravenous Daily   insulin aspart  0-9 Units Subcutaneous TID WC   metoprolol tartrate  25 mg Oral BID   mirtazapine  15 mg Oral QHS   QUEtiapine  50 mg Oral QHS   Continuous Infusions:  ceFEPime (MAXIPIME) IV Stopped (01/26/22 2220)   heparin 1,600 Units/hr (01/27/22 0728)   vancomycin        LOS: 1 day   Charlynne Cousins  [facility]  01/27/2022, 8:28 AM

## 2022-01-27 NOTE — Progress Notes (Signed)
Refused cpap.

## 2022-01-27 NOTE — Care Management Obs Status (Signed)
MEDICARE OBSERVATION STATUS NOTIFICATION ? ? ?Patient Details  ?Name: Christian [name] ?MRN: [id number] ?Date of Birth: [birth year] ? ? ?Medicare Observation Status Notification Given:  Yes ? ? ? ?Harriet Masson, RN ?01/27/2022, 1:35 PM ?

## 2022-01-27 NOTE — TOC Initial Note (Signed)
Transition of Care (TOC) - Initial/Assessment Note  ? ? ?Patient Details  ?Name: Christian [name] ?MRN: [id number] ?Date of Birth: 02-22-1951 ? ?Transition of Care (TOC) CM/SW Contact:    ?Harriet Masson, RN ?Phone Number: ?01/27/2022, 1:37 PM ? ?Clinical Narrative:     ?Spoke to patient regarding transition needs. Patient lives alone and has family support. Patient's sister brings him food. Patient's daughter checks on him and his ex-wife takes him to appointments. Patient is checked on by crisis control for his bipolar disorder.  ?TOC will continue to follow for needs.             ? ? ?Expected Discharge Plan: Home/Self Care ?Barriers to Discharge: Continued Medical Work up ? ? ?Patient Goals and CMS Choice ?Patient states their goals for this hospitalization and ongoing recovery are:: return home ?  ?  ? ?Expected Discharge Plan and Services ?Expected Discharge Plan: Home/Self Care ?  ?Discharge Planning Services: CM Consult ?  ?Living arrangements for the past 2 months: Apartment ?                ?  ?  ?  ?  ?  ?  ?  ?  ?  ?  ? ?Prior Living Arrangements/Services ?Living arrangements for the past 2 months: Apartment ?Lives with:: Self ?Patient language and need for interpreter reviewed:: Yes ?Do you feel safe going back to the place where you live?: Yes      ?Need for Family Participation in Patient Care: Yes (Comment) ?Care giver support system in place?: Yes (comment) ?Current home services: DME (rolator and walker) ?Criminal Activity/Legal Involvement Pertinent to Current Situation/Hospitalization: No - Comment as needed ? ?Activities of Daily Living ?Home Assistive Devices/Equipment: Dan Humphreys (specify type), Shower chair with back, Grab bars around toilet, Grab bars in shower, Hand-held shower hose, Blood pressure cuff, CBG Meter ?ADL Screening (condition at time of admission) ?Patient's cognitive ability adequate to safely complete daily activities?: Yes ?Is the patient deaf or have difficulty hearing?:  No ?Does the patient have difficulty seeing, even when wearing glasses/contacts?: Yes (waiting fpr a May eye appt) ?Does the patient have difficulty concentrating, remembering, or making decisions?: No ?Patient able to express need for assistance with ADLs?: Yes ?Does the patient have difficulty dressing or bathing?: No ?Independently performs ADLs?: Yes (appropriate for developmental age) ?Does the patient have difficulty walking or climbing stairs?: Yes (if I had to) ?Weakness of Legs: None ?Weakness of Arms/Hands: Both (torn rotator cuff both sides) ? ?Permission Sought/Granted ?  ?  ?   ?   ?   ?   ? ?Emotional Assessment ?Appearance:: Appears stated age ?Attitude/Demeanor/Rapport: Engaged ?Affect (typically observed): Accepting ?Orientation: : Oriented to Self, Oriented to Place, Oriented to  Time, Oriented to Situation ?Alcohol / Substance Use: Not Applicable ?Psych Involvement: Yes (comment) (bipolar) ? ?Admission diagnosis:  CHF (congestive heart failure) (HCC) [I50.9] ?Nonhealing nonsurgical wound [T14.8XXA] ?Diabetic ulcer of ankle (HCC) [O67.124, L97.309] ?Cellulitis of right lower extremity [L03.115] ?Acute cystitis without hematuria [N30.00] ?Stage 3b chronic kidney disease (HCC) [N18.32] ?Patient Active Problem List  ? Diagnosis Date Noted  ? Cellulitis in diabetic foot (HCC) 01/26/2022  ? Diabetic ulcer of ankle (HCC) 01/26/2022  ? Sepsis (HCC) 04/02/2021  ? Lactic acidosis 04/02/2021  ? Hyponatremia 04/02/2021  ? Hyperkalemia 04/02/2021  ? Chronic kidney disease, stage 3b (HCC) 04/02/2021  ? Fever 04/02/2021  ? Septic shock (HCC) 04/02/2021  ? Altered mental status   ? AKI (acute kidney injury) (  HCC)   ? CVA (cerebral vascular accident) (HCC) 10/07/2020  ? Type 2 diabetes mellitus with stage 3 chronic kidney disease (HCC) 04/01/2020  ? CAD (coronary artery disease), native coronary artery 04/01/2020  ? NSTEMI (non-ST elevated myocardial infarction) (HCC) 03/31/2020  ? Obesity, Class III, BMI 40-49.9  (morbid obesity) (HCC) 03/31/2020  ? Aspiration pneumonia of both lower lobes due to gastric secretions (HCC) 03/21/2020  ? Sacral decubitus ulcer, stage II (HCC) 03/19/2020  ? Chronic diastolic CHF (congestive heart failure) (HCC) 03/17/2020  ? Atrial flutter, paroxysmal (HCC) 12/27/2019  ? Constipation 09/16/2018  ? Bipolar 1 disorder, depressed (HCC) 08/22/2018  ? Encephalopathy   ? Paroxysmal atrial fibrillation (HCC) 06/18/2018  ? History of pulmonary embolism 06/17/2018  ? Generalized weakness 03/27/2018  ? Diabetes mellitus type 2 in nonobese (HCC) 03/27/2018  ? Pulmonary embolism (HCC) 03/27/2018  ? Fall   ? Laceration of eyebrow   ? Bipolar I disorder, most recent episode depressed, severe w psychosis (HCC) 12/22/2015  ? Bipolar affective disorder, depressed, severe, with psychotic behavior (HCC)   ? Bipolar I disorder, current or most recent episode depressed, with psychotic features (HCC)   ? Severe bipolar I disorder with depression (HCC)   ? Bipolar I disorder, most recent episode depressed, severe with psychotic features (HCC)   ? Overdose 09/11/2015  ? Acute respiratory failure with hypoxia (HCC) 09/05/2015  ? Elevated troponin 09/05/2015  ? Somnolence 09/05/2015  ? Bipolar depression (HCC) 09/05/2015  ? Acute bilateral deep vein thrombosis (DVT) of femoral veins (HCC) 09/05/2015  ? Bilateral pulmonary embolism (HCC) 09/02/2015  ? Labile hypertension   ? Pulmonary embolism with acute cor pulmonale (HCC)   ? Dyspnea   ? Orthostatic hypotension   ? Bipolar disorder, in partial remission, most recent episode depressed (HCC)   ? Syncope, near 08/31/2015  ? Affective psychosis, bipolar (HCC)   ? Bipolar affective disorder, depressed, mild (HCC) 08/28/2015  ? Pressure ulcer 07/30/2015  ? Protein-calorie malnutrition, severe (HCC) 07/30/2015  ? Dehydration 07/29/2015  ? Diabetes (HCC) 07/09/2015  ? UTI (urinary tract infection) 06/19/2014  ? Positive blood culture 06/19/2014  ? Type 2 diabetes mellitus with  complication, without long-term current use of insulin (HCC) 06/02/2014  ? Other and unspecified hyperlipidemia 06/02/2014  ? OSA on CPAP 06/02/2014  ? Bilateral lower extremity edema: chronic with venous stasis changes 06/02/2014  ? Gout 06/02/2014  ? Hypertension   ? ?PCP:  [name], MD ?Pharmacy:   ?[facility] - [place], [place] - [address] ?[address] ?[address] ?[place] [address] ?Phone: [phone] Fax: [phone] ? ? ? ? ?Social Determinants of Health (SDOH) Interventions ?  ? ?Readmission Risk Interventions ?No flowsheet data found. ? ? ?

## 2022-01-27 NOTE — Consult Note (Addendum)
?  Subjective:  ?Patient ID: Christian [name], male    DOB: June 21, 1951,  MRN: [id number] ? ?A 71 y.o. male with medical history significant of chronic venous stasis and chronic right shin unhealing wound, chronic HFpEF, IDDM, CAD with stenting x2 in 2021, PVD, CKD stage IIIa, HTN, DVT on Xarelto, bipolar disorder, OSA on CPAP at bedtime, presented with worsening of right leg infection wit underlying PVD.  Chronic venous insufficiency.  He states his pain and everything is feeling better.  He denies any nausea fever chills vomiting while here in the hospital.  He was seen by the vascular team from which there is no further intervention that is needed. ? ?Objective:  ? ?Vitals:  ? 01/27/22 0315 01/27/22 0645  ?BP: 111/70 126/80  ?Pulse: 72 85  ?Resp: 15 12  ?Temp:    ?SpO2: 92% 98%  ? ?General AA&O x3. Normal mood and affect.  ?Vascular Dorsalis pedis and posterior tibial pulses nonpalpable ?Diminished capillary refill to all digits. Pedal hair not present.  ?Neurologic Epicritic sensation grossly intact.  ?Dermatologic 5 x 5 ulceration noted medial aspect just proximal to the ankle on the right side ink color serosanguineous drainage is expressed.  No fluctuation or crepitation.  Superficial wound on the right second toe with localized edema.  No drainage or pus.  Preulcerative areas to the digits on the left side. ?Edema noted to the right leg and there is increased temperature gradient.  ?Orthopedic: MMT 5/5 in dorsiflexion, plantarflexion, inversion, and eversion. ?Normal joint ROM without pain or crepitus.  ? ? ? ?Assessment & Plan:  ?Patient was evaluated and treated and all questions answered. ? ?Bilateral ulceration right greater than left side with underlying peripheral arterial disease/venous insufficiency and cellulitis ?-All questions and concerns were discussed with the patient in extensive detail ?-From vascular standpoint patient is not a candidate for a bypass and does not have any distal target.  At  this time the recommendation is continue local wound care. ?-From our standpoint he is okay to be discharged on oral antibiotics for 10 days (doxycycline). ?-He can follow-up with [place] in 1 week from discharge for local wound care. ?-Weightbearing as tolerated ?-Unfortunately he is at high risk for major amputation given that patient does not have good circulation in the setting of not an ideal candidate for bypass. ?-Podiatry will sign off please reconsult [place] as needed. ? ? ?Felipa Furnace, DPM ? ?Accessible via secure chat for questions or concerns.  ?

## 2022-01-27 NOTE — Consult Note (Addendum)
WOC Nurse Consult Note: ?Reason for Consult: Consult requested for right leg wound.  Performed remotely after review of progress notes and photo in the EMR.  Pt has been followed by the vascular team in the past.  ?Wound type: Full thickness wound to right inner calf, red and moist. ?Pt is being treated with systemic antibiotics. ?Dressing procedure/placement/frequency: Topical treatment orders provided for bedside nurses to perform to absorb drainage and provide antimicrobial benefits as follows: Apply Aquacel [place] # [phone]) to right leg wound Q day, then cover with foam dressing.  (Change foam dressing Q 3 days or PRN soiling.) Moisten with NS to remove previous dressing each time. ?Please re-consult if further assistance is needed.  Thank-you,  ?Cammie Mcgee MSN, RN, CWOCN, CWCN-AP, CNS ?[phone]  ? ? ?  ?

## 2022-01-27 NOTE — Progress Notes (Addendum)
ANTICOAGULATION CONSULT NOTE ? ?Pharmacy Consult for heparin ?Indication: Hx DVT ? ?Allergies  ?Allergen Reactions  ? Amlodipine Swelling and Other (See Comments)  ?  Legs became swollen  ? Onglyza [Saxagliptin] Other (See Comments)  ?  "Allergic," per [place]  ? Zyprexa [Olanzapine] Other (See Comments)  ?  Allergy noted by ACT Team. No reaction specified (??)- "Allergic," per [place]  ? Ritalin [Methylphenidate] Anxiety  ?  "[place]"  ? ? ?Patient Measurements: ?Height: 6' (182.9 cm) ?Weight: 121.7 kg (268 lb 6.4 oz) ?IBW/kg (Calculated) : 77.6 ?Heparin Dosing Weight: 104kg ? ?Vital Signs: ?BP: 111/70 (03/01 0315) ?Pulse Rate: 72 (03/01 0315) ? ?Labs: ?Recent Labs  ?  01/26/22 ?1031 01/27/22 ?0452  ?HGB 13.7 12.5*  ?HCT 46.9 40.9  ?PLT 182 158  ?APTT  --  >200*  ?HEPARINUNFRC  --  >1.10*  ?CREATININE 2.01* 1.87*  ? ? ? ?Estimated Creatinine Clearance: 50.9 mL/min (A) (by C-G formula based on SCr of 1.87 mg/dL (H)). ? ? ?Medical History: ?Past Medical History:  ?Diagnosis Date  ? Bilateral lower extremity edema: chronic with venous stasis changes 06/02/2014  ? Bipolar 1 disorder ([place])   ? Bipolar disorder ([place])   ? CHF (congestive heart failure) ([place])   ? Chronic kidney disease   ? Gout   ? Gout   ? Hx of blood clots   ? Hypertension   ? Mood swings   ? OSA on CPAP 06/02/2014  ? Other and unspecified hyperlipidemia 06/02/2014  ? Type II or unspecified type diabetes mellitus with unspecified complication, uncontrolled 06/02/2014  ? ? ?Assessment: ?44 YOM presenting with worsening leg wounds, PVD, he is on Xarelto PTA for hx DVT, last dose taken 2/28 @0100  ? ?Initial aptt >200 sec & HL>1.1.  Lab was drawn from same arm as heparin infusing so levels inaccurate.  ?CBC WNL.  No bleeding reported by RN.  ? ?Goal of Therapy:  ?Heparin level 0.3-0.7 units/ml ?aPTT 66-102 seconds ?Monitor platelets by anticoagulation protocol: Yes ?  ?Plan:  ?RN to re-drawn aptt from opposite arm. ? ?Netta Cedars,  PharmD ?01/27/2022 6:11 AM ? ?Addendum- ?Repeat aptt 66 sec- therapeutic on 1500 units/hr. ?Plan: Will increase slightly to 1600 units/hr to maintain therapeutic range. Recheck heparin level in 8h ? ?Netta Cedars, PharmD, BCPS ?01/27/2022@7 :23 AM ? ?

## 2022-01-27 NOTE — Care Management CC44 (Signed)
Condition Code 44 Documentation Completed ? ?Patient Details  ?Name: Christian [name] ?MRN: [id number] ?Date of Birth: 1951-04-27 ? ? ?Condition Code 44 given:  Yes ?Patient signature on Condition Code 44 notice:  Yes ?Documentation of 2 MD's agreement:  Yes ?Code 44 added to claim:  Yes ? ? ? ?Cyndi Bender, RN ?01/27/2022, 1:34 PM ? ?

## 2022-01-27 NOTE — Plan of Care (Signed)
  Problem: Elimination: Goal: Will not experience complications related to urinary retention Outcome: Progressing   Problem: Pain Managment: Goal: General experience of comfort will improve Outcome: Progressing   

## 2022-01-27 NOTE — Progress Notes (Signed)
Pt arrived to floor around 1245. Pt transfer from stretcher to hospital bed x 2 assist. Pt alert and oriented x4 in no acute distress. VSS. Respirations even and unlabored on room. Bed in low position. Call bell with reach.  ?

## 2022-01-28 ENCOUNTER — Other Ambulatory Visit (HOSPITAL_COMMUNITY): Payer: Self-pay

## 2022-01-28 DIAGNOSIS — I5043 Acute on chronic combined systolic (congestive) and diastolic (congestive) heart failure: Secondary | ICD-10-CM

## 2022-01-28 DIAGNOSIS — N189 Chronic kidney disease, unspecified: Secondary | ICD-10-CM

## 2022-01-28 DIAGNOSIS — N179 Acute kidney failure, unspecified: Secondary | ICD-10-CM | POA: Diagnosis not present

## 2022-01-28 DIAGNOSIS — N1832 Chronic kidney disease, stage 3b: Secondary | ICD-10-CM | POA: Diagnosis not present

## 2022-01-28 DIAGNOSIS — T148XXA Other injury of unspecified body region, initial encounter: Secondary | ICD-10-CM | POA: Diagnosis not present

## 2022-01-28 DIAGNOSIS — L97309 Non-pressure chronic ulcer of unspecified ankle with unspecified severity: Secondary | ICD-10-CM | POA: Diagnosis not present

## 2022-01-28 DIAGNOSIS — L03119 Cellulitis of unspecified part of limb: Secondary | ICD-10-CM | POA: Diagnosis not present

## 2022-01-28 DIAGNOSIS — E11628 Type 2 diabetes mellitus with other skin complications: Secondary | ICD-10-CM | POA: Diagnosis not present

## 2022-01-28 DIAGNOSIS — E11622 Type 2 diabetes mellitus with other skin ulcer: Secondary | ICD-10-CM | POA: Diagnosis not present

## 2022-01-28 LAB — BASIC METABOLIC PANEL
Anion gap: 9 (ref 5–15)
BUN: 30 mg/dL — ABNORMAL HIGH (ref 8–23)
CO2: 23 mmol/L (ref 22–32)
Calcium: 8.8 mg/dL — ABNORMAL LOW (ref 8.9–10.3)
Chloride: 104 mmol/L (ref 98–111)
Creatinine, Ser: 1.65 mg/dL — ABNORMAL HIGH (ref 0.61–1.24)
GFR, Estimated: 45 mL/min — ABNORMAL LOW (ref >60)
Glucose, Bld: 174 mg/dL — ABNORMAL HIGH (ref 70–99)
Potassium: 4.9 mmol/L (ref 3.5–5.1)
Sodium: 136 mmol/L (ref 135–145)

## 2022-01-28 LAB — GLUCOSE, CAPILLARY
Glucose-Capillary: 123 mg/dL — ABNORMAL HIGH (ref 70–99)
Glucose-Capillary: 197 mg/dL — ABNORMAL HIGH (ref 70–99)
Glucose-Capillary: 151 mg/dL — ABNORMAL HIGH (ref 70–99)
Glucose-Capillary: 223 mg/dL — ABNORMAL HIGH (ref 70–99)
Glucose-Capillary: 176 mg/dL — ABNORMAL HIGH (ref 70–99)

## 2022-01-28 MED ORDER — METOPROLOL SUCCINATE ER 50 MG PO TB24
50.0000 mg | ORAL_TABLET | Freq: Every day | ORAL | 0 refills | Status: AC
  Filled 2022-01-28: qty 30, 30d supply, fill #0

## 2022-01-28 MED ORDER — DOXYCYCLINE HYCLATE 100 MG PO TABS
100.0000 mg | ORAL_TABLET | Freq: Two times a day (BID) | ORAL | 0 refills | Status: DC
  Filled 2022-01-28: qty 28, 14d supply, fill #0

## 2022-01-28 MED ORDER — DOXYCYCLINE HYCLATE 100 MG PO TABS
100.0000 mg | ORAL_TABLET | Freq: Two times a day (BID) | ORAL | 0 refills | Status: AC
  Filled 2022-01-28: qty 12, 6d supply, fill #0

## 2022-01-28 MED ORDER — COVID-19MRNA BIVAL VACC PFIZER 30 MCG/0.3ML IM SUSP
0.3000 mL | Freq: Once | INTRAMUSCULAR | Status: AC
Start: 2022-01-28 — End: 2022-01-28
  Administered 2022-01-28: 0.3 mL via INTRAMUSCULAR
  Filled 2022-01-28: qty 0.3

## 2022-01-28 MED ORDER — FUROSEMIDE 10 MG/ML IJ SOLN
80.0000 mg | Freq: Once | INTRAMUSCULAR | Status: AC
  Administered 2022-01-28: 80 mg via INTRAVENOUS
  Filled 2022-01-28: qty 8

## 2022-01-28 MED ORDER — FUROSEMIDE 40 MG PO TABS: ORAL_TABLET | ORAL | 0 refills | Status: DC

## 2022-01-28 NOTE — NC FL2 (Addendum)
?[place] MEDICAID FL2 LEVEL OF CARE SCREENING TOOL  ?  ? ?IDENTIFICATION  ?Patient Name: ?Christian [name] Birthdate: [birth year] Sex: male Admission Date (Current Location): ?01/26/2022  ?[place] Number: ? [place] ?  Facility and Address:  ?[facility], [address] [place], [place] [address] ?     Provider Number: ?[id number]  ?Attending Physician Name and Address:  ?Marinda Elk, MD ? Relative Name and Phone Number:  ?  ?   ?Current Level of Care: ?Hospital Recommended Level of Care: ?Skilled Nursing Facility Prior Approval Number: ?  ? ?Date Approved/Denied: ?  PASRR Number: ?[id number] E expires 02/27/22 ? ?Discharge Plan: ?SNF ?  ? ?Current Diagnoses: ?Patient Active Problem List  ? Diagnosis Date Noted  ? Cellulitis in diabetic foot (HCC) 01/26/2022  ? Diabetic ulcer of ankle (HCC) 01/26/2022  ? Sepsis (HCC) 04/02/2021  ? Lactic acidosis 04/02/2021  ? Hyponatremia 04/02/2021  ? Hyperkalemia 04/02/2021  ? Chronic kidney disease, stage 3b (HCC) 04/02/2021  ? Fever 04/02/2021  ? Septic shock (HCC) 04/02/2021  ? Altered mental status   ? AKI (acute kidney injury) (HCC)   ? CVA (cerebral vascular accident) (HCC) 10/07/2020  ? Type 2 diabetes mellitus with stage 3 chronic kidney disease (HCC) 04/01/2020  ? CAD (coronary artery disease), native coronary artery 04/01/2020  ? NSTEMI (non-ST elevated myocardial infarction) (HCC) 03/31/2020  ? Obesity, Class III, BMI 40-49.9 (morbid obesity) (HCC) 03/31/2020  ? Aspiration pneumonia of both lower lobes due to gastric secretions (HCC) 03/21/2020  ? Sacral decubitus ulcer, stage II (HCC) 03/19/2020  ? Chronic diastolic CHF (congestive heart failure) (HCC) 03/17/2020  ? Atrial flutter, paroxysmal (HCC) 12/27/2019  ? Constipation 09/16/2018  ? Bipolar 1 disorder, depressed (HCC) 08/22/2018  ? Encephalopathy   ? Paroxysmal atrial fibrillation (HCC) 06/18/2018  ? History of pulmonary embolism 06/17/2018  ? Generalized weakness  03/27/2018  ? Diabetes mellitus type 2 in nonobese (HCC) 03/27/2018  ? Pulmonary embolism (HCC) 03/27/2018  ? Fall   ? Laceration of eyebrow   ? Bipolar I disorder, most recent episode depressed, severe w psychosis (HCC) 12/22/2015  ? Bipolar affective disorder, depressed, severe, with psychotic behavior (HCC)   ? Bipolar I disorder, current or most recent episode depressed, with psychotic features (HCC)   ? Severe bipolar I disorder with depression (HCC)   ? Bipolar I disorder, most recent episode depressed, severe with psychotic features (HCC)   ? Overdose 09/11/2015  ? Acute respiratory failure with hypoxia (HCC) 09/05/2015  ? Elevated troponin 09/05/2015  ? Somnolence 09/05/2015  ? Bipolar depression (HCC) 09/05/2015  ? Acute bilateral deep vein thrombosis (DVT) of femoral veins (HCC) 09/05/2015  ? Bilateral pulmonary embolism (HCC) 09/02/2015  ? Labile hypertension   ? Pulmonary embolism with acute cor pulmonale (HCC)   ? Dyspnea   ? Orthostatic hypotension   ? Bipolar disorder, in partial remission, most recent episode depressed (HCC)   ? Syncope, near 08/31/2015  ? Affective psychosis, bipolar (HCC)   ? Bipolar affective disorder, depressed, mild (HCC) 08/28/2015  ? Pressure ulcer 07/30/2015  ? Protein-calorie malnutrition, severe (HCC) 07/30/2015  ? Dehydration 07/29/2015  ? Diabetes (HCC) 07/09/2015  ? UTI (urinary tract infection) 06/19/2014  ? Positive blood culture 06/19/2014  ? Type 2 diabetes mellitus with complication, without long-term current use of insulin (HCC) 06/02/2014  ? Other and unspecified hyperlipidemia 06/02/2014  ? OSA on CPAP 06/02/2014  ? Bilateral lower extremity edema: chronic with venous stasis changes 06/02/2014  ?  Gout 06/02/2014  ? Hypertension   ? ? ?Orientation RESPIRATION BLADDER Height & Weight   ?  ?Self, Time, Situation, Place ? Normal Continent, External catheter Weight: 271 lb 2.7 oz (123 kg) ?Height:  6' (182.9 cm)  ?BEHAVIORAL SYMPTOMS/MOOD NEUROLOGICAL BOWEL NUTRITION  STATUS  ?    Continent Diet (See Dc summary)  ?AMBULATORY STATUS COMMUNICATION OF NEEDS Skin   ?Extensive Assist Verbally Other (Comment) (Venous stasis ulcer) ?  ?  ?  ?    ?     ?     ? ? ?Personal Care Assistance Level of Assistance  ?Bathing, Feeding, Dressing Bathing Assistance: Limited assistance ?Feeding assistance: Independent ?Dressing Assistance: Limited assistance ?   ? ?Functional Limitations Info  ?    ?  ?   ? ? ?SPECIAL CARE FACTORS FREQUENCY  ?PT (By licensed PT), OT (By licensed OT)   ?  ?PT Frequency: 5x/week ?OT Frequency: 5x/week ?  ?  ?  ?   ? ? ?Contractures Contractures Info: Not present  ? ? ?Additional Factors Info  ?Code Status, Allergies, Psychotropic, Insulin Sliding Scale Code Status Info: Full ?Allergies Info: Amlodipine, Onglyza (Saxagliptin), Zyprexa (Olanzapine), Ritalin (Methylphenidate) ?Psychotropic Info: Abilify, Wellbutrin, Depakote, Seroquel ?Insulin Sliding Scale Info: See dc summary ?  ?   ? ?Current Medications (01/28/2022):  This is the current hospital active medication list ?Current Facility-Administered Medications  ?Medication Dose Route Frequency Provider Last Rate Last Admin  ? acetaminophen (TYLENOL) tablet 650 mg  650 mg Oral Q6H PRN Mikey College T, MD      ? ARIPiprazole (ABILIFY) tablet 20 mg  20 mg Oral Daily Mikey College T, MD   20 mg at 01/28/22 0847  ? aspirin EC tablet 81 mg  81 mg Oral Daily Mikey College T, MD   81 mg at 01/28/22 1610  ? atorvastatin (LIPITOR) tablet 10 mg  10 mg Oral q1800 Emeline General, MD   10 mg at 01/27/22 1719  ? buPROPion [facility]) tablet 75 mg  75 mg Oral Daily Mikey College T, MD   75 mg at 01/28/22 0847  ? COVID-19 mRNA bivalent vaccine (Pfizer) injection 0.3 mL  0.3 mL Intramuscular ONCE-1600 Marinda Elk, MD      ? divalproex (DEPAKOTE ER) 24 hr tablet 500 mg  500 mg Oral Daily Mikey College T, MD   500 mg at 01/28/22 9604  ? doxycycline (VIBRA-TABS) tablet 100 mg  100 mg Oral Q12H Marinda Elk, MD   100 mg at 01/28/22  5409  ? furosemide (LASIX) injection 20 mg  20 mg Intravenous Daily Marinda Elk, MD   20 mg at 01/28/22 8119  ? insulin aspart (novoLOG) injection 0-9 Units  0-9 Units Subcutaneous TID WC Emeline General, MD   1 Units at 01/28/22 [phone]  ? liothyronine (CYTOMEL) tablet 10 mcg  10 mcg Oral Daily Doristine Counter, RPH   10 mcg at 01/28/22 2956  ? metoprolol tartrate (LOPRESSOR) tablet 25 mg  25 mg Oral BID Mikey College T, MD   25 mg at 01/28/22 2130  ? mirtazapine (REMERON) tablet 15 mg  15 mg Oral QHS Emeline General, MD   15 mg at 01/27/22 2159  ? ondansetron (ZOFRAN) tablet 4 mg  4 mg Oral Q6H PRN Mikey College T, MD      ? Or  ? ondansetron [facility]) injection 4 mg  4 mg Intravenous Q6H PRN Emeline General, MD      ? oxyCODONE (Oxy  IR/ROXICODONE) immediate release tablet 5 mg  5 mg Oral Q4H PRN Emeline General, MD   5 mg at 01/28/22 8469  ? polyethylene glycol (MIRALAX / GLYCOLAX) packet 17 g  17 g Oral Daily PRN Mikey College T, MD      ? QUEtiapine (SEROQUEL) tablet 50 mg  50 mg Oral QHS Emeline General, MD   50 mg at 01/27/22 2159  ? rivaroxaban (XARELTO) tablet 20 mg  20 mg Oral Q supper Mikey College T, MD   20 mg at 01/27/22 1342  ? ? ? ?Discharge Medications: ?Please see discharge summary for a list of discharge medications. ? ?Relevant Imaging Results: ? ?Relevant Lab Results: ? ? ?Additional Information ?SS#: [id number]. Has received COVID vaccines.  ? ?Mearl Latin, LCSW ? ? ? ? ?

## 2022-01-28 NOTE — Evaluation (Signed)
Physical Therapy Evaluation ?Patient Details ?Name: Christian [name] ?MRN: [id number] ?DOB: [birth year] ?Today's Date: 01/28/2022 ? ?History of Present Illness ? 70 y.o. male presented 01/26/22 with worsening of right leg infection. Acute on chronic HF  PMH significant of chronic venous stasis and chronic right shin unhealing wound, chronic HFpEF, IDDM, CAD with stenting x2 in 2021, PVD, CKD stage IIIa, HTN, DVT on Xarelto, bipolar disorder, OSA on CPAP at bedtime  ?Clinical Impression ?  ?Pt admitted secondary to problem above with deficits below. PTA patient was living alone with good family support for groceries, appointments, etc. He was ambulatory with rollator independently.  Pt currently requires mod assist to get OOB and mod assist to stand from an elevated surface (bed elevated 6"). He was unable to stand with 1 person assist from standard height. Discussed discharge plan and although pt acknowledges/agrees he is currently not safe to ambulate, he is not yet agreeing to SNF.  Anticipate patient will benefit from PT to address problems listed below.Will continue to follow acutely to maximize functional mobility independence and safety.   ?   ?   ? ?Recommendations for follow up therapy are one component of a multi-disciplinary discharge planning process, led by the attending physician.  Recommendations may be updated based on patient status, additional functional criteria and insurance authorization. ? ?Follow Up Recommendations Skilled nursing-short term rehab (<3 hours/day) (pt may refuse) ? ?  ?Assistance Recommended at Discharge Frequent or constant Supervision/Assistance  ?Patient can return home with the following ? Two people to help with walking and/or transfers;Two people to help with bathing/dressing/bathroom;Assist for transportation ? ?  ?Equipment Recommendations Wheelchair (measurements PT);Wheelchair cushion (measurements PT)  ?Recommendations for Other Services ? OT consult  ?  ?Functional Status  Assessment Patient has had a recent decline in their functional status and demonstrates the ability to make significant improvements in function in a reasonable and predictable amount of time.  ? ?  ?Precautions / Restrictions Precautions ?Precautions: Fall ?Precaution Comments: reports last fall was 6-8 months ago (missed edge of couch as he went to sit down) ?Restrictions ?RLE Weight Bearing: Weight bearing as tolerated  ? ?  ? ?Mobility ? Bed Mobility ?Overal bed mobility: Needs Assistance ?Bed Mobility: Supine to Sit, Sit to Supine ?  ?  ?Supine to sit: Mod assist ?Sit to supine: Min assist ?  ?General bed mobility comments: pt unable to raise torso without assist; assist also to scoot hips to EOB; assist to raise legs onto bed ?  ? ?Transfers ?Overall transfer level: Needs assistance ?Equipment used: Rolling walker (2 wheels) ?Transfers: Sit to/from Stand ?Sit to Stand: Mod assist, From elevated surface ?  ?  ?  ?  ?  ?General transfer comment: unable to stand with +1 assist from standard height bed; bed elevated ~6" and able to stand with mod assist; attempted x 5 reps (success x 2) ?  ? ?Ambulation/Gait ?  ?  ?  ?  ?  ?  ?  ?General Gait Details: unsafe to ambulate due to legs with spontaneous loss of contraction and then re-engaging (?asterixis) ? ?Stairs ?  ?  ?  ?  ?  ? ?Wheelchair Mobility ?  ? ?Modified Rankin (Stroke Patients Only) ?  ? ?  ? ?Balance Overall balance assessment: Needs assistance ?Sitting-balance support: No upper extremity supported, Feet supported ?Sitting balance-Leahy Scale: Fair ?  ?  ?Standing balance support: Bilateral upper extremity supported, Reliant on assistive device for balance ?Standing balance-Leahy Scale: Poor ?  ?  ?  ?  ?  ?  ?  ?  ?  ?  ?  ?  ?   ? ? ? ?  Pertinent Vitals/Pain Pain Assessment ?Pain Assessment: No/denies pain  ? ? ?Home Living Family/patient expects to be discharged to:: Private residence ?Living Arrangements: Alone ?Available Help at Discharge:  Family;Available PRN/intermittently ?Type of Home: Apartment ?Home Access: Level entry ?  ?  ?  ?Home Layout: One level ?Home Equipment: Rollator (4 wheels);Shower seat;Grab bars - toilet;Grab bars - tub/shower ?   ?  ?Prior Function Prior Level of Function : Needs assist ?  ?  ?  ?  ?  ?  ?Mobility Comments: was walking with rollator (and even without at times); independent with all mobility inside home; family drives him to appts ?ADLs Comments: family assists with bringing food, checks on him daily, and drives to appts ?  ? ? ?Hand Dominance  ? Dominant Hand: Right ? ?  ?Extremity/Trunk Assessment  ? Upper Extremity Assessment ?Upper Extremity Assessment: Generalized weakness ?  ? ?Lower Extremity Assessment ?Lower Extremity Assessment: Generalized weakness;RLE deficits/detail;LLE deficits/detail ?RLE Deficits / Details: hip flexion 2+; in weight-bearing legs were "twitching" with contraction and releasing of muscle (rhythmically) ?LLE Deficits / Details: hip flexion 3; in weight-bearing legs were "twitching" with contraction and releasing of muscle (rhythmically) ?  ? ?Cervical / Trunk Assessment ?Cervical / Trunk Assessment: Other exceptions ?Cervical / Trunk Exceptions: overweight  ?Communication  ? Communication: No difficulties  ?Cognition Arousal/Alertness: Lethargic, Suspect due to medications (pt reports seroquel at night makes him groggy) ?Behavior During Therapy: Flat affect ?  ?  ?  ?  ?  ?  ?  ?  ?  ?  ?  ?  ?  ?  ?  ?  ?  ?General Comments: Acknowledges that he is not safe to walk today, but still wants to go home (decr safety awareness). Admits to previous stays at SNF not productive because he would not participate. ?  ?  ? ?  ?General Comments   ? ?  ?Exercises    ? ?Assessment/Plan  ?  ?PT Assessment Patient needs continued PT services  ?PT Problem List Decreased strength;Decreased activity tolerance;Decreased balance;Decreased mobility;Decreased safety awareness;Obesity ? ?   ?  ?PT Treatment  Interventions DME instruction;Gait training;Functional mobility training;Therapeutic activities;Therapeutic exercise;Cognitive remediation;Patient/family education   ? ?PT Goals (Current goals can be found in the Care Plan section)  ?Acute Rehab PT Goals ?Patient Stated Goal: go ome ?PT Goal Formulation: With patient ?Time For Goal Achievement: 02/11/22 ?Potential to Achieve Goals: Good ? ?  ?Frequency Min 3X/week ?  ? ? ?Co-evaluation   ?  ?  ?  ?  ? ? ?  ?AM-PAC PT "6 Clicks" Mobility  ?Outcome Measure Help needed turning from your back to your side while in a flat bed without using bedrails?: A Little ?Help needed moving from lying on your back to sitting on the side of a flat bed without using bedrails?: A Lot ?Help needed moving to and from a bed to a chair (including a wheelchair)?: Total ?Help needed standing up from a chair using your arms (e.g., wheelchair or bedside chair)?: Total ?Help needed to walk in hospital room?: Total ?Help needed climbing 3-5 steps with a railing? : Total ?6 Click Score: 9 ? ?  ?End of Session Equipment Utilized During Treatment: Gait belt ?Activity Tolerance: Treatment limited secondary to medical complications (Comment) (muscle "twitching" in standing) ?Patient left: in bed;with call bell/phone within reach;with bed alarm set ?Nurse Communication: Mobility status;Other (comment) (currently not safe to discharge home alone) ?PT Visit Diagnosis: Other abnormalities of gait and mobility (R26.89);Muscle  weakness (generalized) (M62.81);History of falling (Z91.81) ?  ? ?Time: [id number] ?PT Time Calculation (min) (ACUTE ONLY): 39 min ? ? ?Charges:   PT Evaluation ?$PT Eval Moderate Complexity: 1 Mod ?PT Treatments ?$Therapeutic Activity: 23-37 mins ?  ?   ? ? ? ?Arby Barrette, PT ?Acute Rehabilitation Services  ?Pager [phone] ?Office [phone] ? ? ?Christian [name] ?01/28/2022, 10:22 AM ? ?

## 2022-01-28 NOTE — Progress Notes (Addendum)
?  Progress Note ? ? ? ?01/28/2022 ?7:56 AM ?Hospital Day 2 ? ?Subjective:  sitting in bed eating breakfast.  Says he is leaving today.  Wants to get his covid booster before leaving.  Says his bandage was changed this morning. ? ?afebrile ? ?Vitals:  ? 01/28/22 0336 01/28/22 0400  ?BP: (!) 147/85 116/77  ?Pulse: 79   ?Resp: (!) 21 19  ?Temp: 98.4 ?F (36.9 ?C)   ?SpO2: 98%   ? ? ?Physical Exam: ?General:  no distress ?Lungs:  non labored ?Extremities:  bandage in place RLE ? ?CBC ?   ?Component Value Date/Time  ? WBC 4.4 01/27/2022 0452  ? RBC 4.99 01/27/2022 0452  ? HGB 12.5 (L) 01/27/2022 0452  ? HGB 12.8 (L) 08/10/2014 1327  ? HCT 40.9 01/27/2022 0452  ? HCT 40.1 08/10/2014 1327  ? PLT 158 01/27/2022 0452  ? PLT 143 (L) 08/10/2014 1327  ? MCV 82.0 01/27/2022 0452  ? MCV 95 08/10/2014 1327  ? MCH 25.1 (L) 01/27/2022 0452  ? MCHC 30.6 01/27/2022 0452  ? RDW 17.4 (H) 01/27/2022 0452  ? RDW 14.0 08/10/2014 1327  ? LYMPHSABS 1.3 01/26/2022 1031  ? MONOABS 0.8 01/26/2022 1031  ? EOSABS 0.1 01/26/2022 1031  ? BASOSABS 0.0 01/26/2022 1031  ? ? ?BMET ?   ?Component Value Date/Time  ? NA 136 01/28/2022 0359  ? NA 139 08/10/2014 1327  ? K 4.9 01/28/2022 0359  ? K 4.3 08/10/2014 1327  ? CL 104 01/28/2022 0359  ? CL 105 08/10/2014 1327  ? CO2 23 01/28/2022 0359  ? CO2 27 08/10/2014 1327  ? GLUCOSE 174 (H) 01/28/2022 0359  ? GLUCOSE 109 (H) 08/10/2014 1327  ? BUN 30 (H) 01/28/2022 0359  ? BUN 13 08/10/2014 1327  ? CREATININE 1.65 (H) 01/28/2022 0359  ? CREATININE 1.25 08/10/2014 1327  ? CALCIUM 8.8 (L) 01/28/2022 0359  ? CALCIUM 8.8 08/10/2014 1327  ? GFRNONAA 45 (L) 01/28/2022 0359  ? GFRNONAA >60 08/10/2014 1327  ? GFRAA 52 (L) 04/01/2020 0155  ? GFRAA >60 08/10/2014 1327  ? ? ?INR ?   ?Component Value Date/Time  ? INR 1.6 (H) 04/01/2021 2029  ? ? ? ?Intake/Output Summary (Last 24 hours) at 01/28/2022 0756 ?Last data filed at 01/28/2022 0553 ?Gross per 24 hour  ?Intake 1078.85 ml  ?Output 2350 ml  ?Net -1271.15 ml   ? ? ? 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 ?-pt dressing changed this morning ?-needs to elevate legs ?-f/u with [facility] as outpatient with venous duplex study.  Message sent to the office and they will arrange appt. ?-pt would like covid booster prior to dc-will defer to primary team.  ? ? ?Doreatha Massed, PA-C ?[facility] ?[phone] ?01/28/2022 ?7:56 AM ? ?

## 2022-01-28 NOTE — TOC Progression Note (Signed)
Transition of Care (TOC) - Progression Note  ? ? ?Patient Details  ?Name: Christian [name] ?MRN: [id number] ?Date of Birth: [birth year] ? ?Transition of Care (TOC) CM/SW Contact  ?Mearl Latin, LCSW ?Phone Number: ?01/28/2022, 4:28 PM ? ?Clinical Narrative:    ?11am-CSW notified by [facility] that patient's daughter/Guardian, [name] is requesting SNF for patient. Patient agreeable to SNF as long as it is not [facility]. Referral sent out and pasrr submitted for review.  ? ?CSW provided SNF bed offers to patient's daughter. She has selected [facility] in [place]. They are able to accept patient tomorrow. CSW initiated insurance [place], Ref# [id number]. No new COVID test needed.  ? ? ?Expected Discharge Plan: Skilled Nursing Facility ?Barriers to Discharge: Insurance Authorization ? ?Expected Discharge Plan and Services ?Expected Discharge Plan: Skilled Nursing Facility ?In-house Referral: Clinical Social Work ?Discharge Planning Services: CM Consult ?Post Acute Care Choice: Skilled Nursing Facility ?Living arrangements for the past 2 months: Apartment ?Expected Discharge Date: 01/28/22               ?  ?  ?  ?  ?  ?HH Arranged: RN, PT, OT ?HH Agency: [facility] ?Date HH Agency Contacted: 01/28/22 ?Time HH Agency Contacted: [phone] ?Representative spoke with at [facility] Agency: Kandee Keen ? ? ?Social Determinants of Health (SDOH) Interventions ?  ? ?Readmission Risk Interventions ?Readmission Risk Prevention Plan 01/28/2022  ?Transportation Screening Complete  ?PCP or Specialist Appt within 5-7 Days Complete  ?Home Care Screening Complete  ?Medication Review (RN CM) Complete  ?Some recent data might be hidden  ? ? ?

## 2022-01-28 NOTE — Discharge Summary (Signed)
Physician Discharge Summary  Christian [name] ZOX:[id number] DOB: [birth year] DOA: 01/26/2022  PCP: [name], MD  Admit date: 01/26/2022 Discharge date: 01/28/2022  Admitted From: Home Disposition:  Home  Recommendations for Outpatient Follow-up:  Follow up with PCP in 1-2 weeks Please obtain BMP/CBC in one week   Home Health:No Equipment/Devices:none  Discharge Condition:stable CODE STATUS:Full Diet recommendation: Heart Healthy   Brief/Interim Summary: 71 y.o. male past medical history significant for chronic venous stasis, chronic right shin and heel ulcer, chronic preserved heart failure, insulin-dependent diabetes mellitus, CAD with stenting x2 in 2021, PVD, chronic kidney disease stage IIIa, DVT on Xarelto bipolar disorder obstructive sleep apnea presents with worsening right leg infection, vascular was consulted recommended not to repeat an angiogram or bypass surgical intervention, recommended elevation of the legs and compression but due to his peripheral arterial disease will recommend mild elevation of the legs with mild compression.  We will follow-up with them as an outpatient.  Podiatrist was consulted local wound care with antibiotics.  Weightbearing as tolerated, unfortunately he is high risk for major amputation.  Discharge Diagnoses:  Principal Problem:   Diabetic ulcer of ankle (HCC) Active Problems:   Cellulitis in diabetic foot (HCC)  Right shin diabetic foot ulcer: Started on IV vancomycin and cefepime, MRSA screening was negative. Vascular was consulted recommended no further surgical intervention as well as podiatrist. His Tmax was 98.7 with no leukocytosis. He was transitioned to oral doxycycline which she will continue as an outpatient. Physical therapy evaluated the patient and recommended home health. We will also have home health help do wound care at at home. Vascular surgery recommended to follow-up with them as an outpatient for venous  duplex.  Acute on chronic decompensated diastolic heart failure: He appears slightly fluid overloaded on physical exam he was started on IV Lasix about a liter and a half, he will continue his Lasix dose of 40 mg twice a day for 3 days then go back to 40 mg daily at home. [facility] recheck a basic metabolic panel as an outpatient and send it to his PCP.  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 She will follow-up with PCP basic metabolic panel in 1 to 2 weeks.  Peripheral vascular disease: No surgical intervention needed continue aspirin follow-up with vascular as an outpatient.  Diabetes mellitus type 2: Blood glucose fairly controlled no changes made to his medication.  History of DVT: Continue Xarelto as an outpatient.  Sacral decubitus ulcer stage II present on admission: Dressing as directed and turn every 2 hours home health to follow-up with him as an outpatient.  Discharge Instructions  Discharge Instructions     Diet - low sodium heart healthy   Complete by: As directed    Increase activity slowly   Complete by: As directed    No wound care   Complete by: As directed       Allergies as of 01/28/2022       Reactions   Amlodipine Swelling, Other (See Comments)   Legs became swollen   Onglyza [saxagliptin] Other (See Comments)   "Allergic," per [facility]   Zyprexa [olanzapine] Other (See Comments)   Allergy noted by ACT Team. No reaction specified (??)- "Allergic," per [facility]   Ritalin [methylphenidate] Anxiety   "Maniac"        Medication List  TAKE these medications    acetaminophen 500 MG tablet Commonly known as: TYLENOL Take 500-1,000 mg by mouth every 6 (six) hours as needed (pain.).   ARIPiprazole 20 MG tablet Commonly known as: ABILIFY Take 1  tablet (20 mg total) by mouth daily.   aspirin EC 81 MG tablet Take 81 mg by mouth daily. Swallow whole.   atorvastatin 10 MG tablet Commonly known as: LIPITOR Take 1 tablet (10 mg total) by mouth daily at 6 PM. What changed: when to take this   buPROPion 75 MG tablet Commonly known as: WELLBUTRIN Take 1 tablet (75 mg total) by mouth daily.   divalproex 500 MG 24 hr tablet Commonly known as: DEPAKOTE ER Take 500 mg by mouth in the morning and at bedtime.   doxycycline 100 MG tablet Commonly known as: VIBRA-TABS Take 1 tablet (100 mg total) by mouth every 12 (twelve) hours for 14 days.   empagliflozin 10 MG Tabs tablet Commonly known as: JARDIANCE Take 10 mg by mouth daily.   furosemide 40 MG tablet Commonly known as: LASIX Take Lasix 40 mg twice a day for 3 days then go back to Lasix 40 mg daily What changed:  how much to take how to take this when to take this additional instructions   glimepiride 2 MG tablet Commonly known as: AMARYL Take 2 mg by mouth daily.   insulin glargine 100 UNIT/ML injection Commonly known as: LANTUS Inject 10 Units into the skin daily.   liothyronine 5 MCG tablet Commonly known as: CYTOMEL Take 10 mcg by mouth daily.   metoprolol succinate 50 MG 24 hr tablet Commonly known as: TOPROL-XL Take 1 tablet (50 mg total) by mouth daily. Take with or immediately following a meal. What changed: Another medication with the same name was removed. Continue taking this medication, and follow the directions you see here.   mirtazapine 15 MG tablet Commonly known as: REMERON Take 15 mg by mouth at bedtime.   mupirocin ointment 2 % Commonly known as: BACTROBAN APPLY 1 APPLICATION TOPICALLY DAILY. APPLIED TO TOE WOUND What changed:  when to take this additional instructions   polyethylene glycol powder 17 GM/SCOOP powder Commonly known as: GLYCOLAX/MIRALAX Take 17 g by mouth daily. What changed:  when to take this reasons to take this    QUEtiapine 50 MG tablet Commonly known as: SEROQUEL Take 50 mg by mouth at bedtime.   rivaroxaban 20 MG Tabs tablet Commonly known as: XARELTO Take 1 tablet (20 mg total) by mouth daily with supper. What changed: when to take this        Follow-up Information     Chuck Hint, MD Follow up in 2 week(s).   Specialties: Vascular Surgery, Cardiology Why: Office will call you to arrange your appt (sent) Contact information: [address] [place] [address] [phone]                Allergies  Allergen Reactions   Amlodipine Swelling and Other (See Comments)    Legs became swollen   Onglyza [Saxagliptin] Other (See Comments)    "Allergic," per [facility]   Zyprexa [Olanzapine] Other (See Comments)    Allergy noted by ACT Team. No reaction specified (??)- "Allergic," per [facility]   Ritalin [Methylphenidate] Anxiety    "Maniac"    Consultations: Vascular surgery Podiatrist   Procedures/Studies: DG Chest Port 1 View  Result Date: 01/26/2022 CLINICAL DATA:  Congestive heart failure. EXAM: PORTABLE CHEST 1 VIEW COMPARISON:  Apr 02, 2021. FINDINGS: The heart size and mediastinal contours are within normal limits. Both lungs are clear. The visualized skeletal structures are unremarkable. IMPRESSION: No active disease. Electronically Signed   By: Lupita Raider M.D.   On: 01/26/2022 15:44   DG Foot Complete Right  Result Date: 01/26/2022 Please see detailed radiograph report in office note.  DG Foot Complete Right  Result Date: 01/26/2022 Please see detailed radiograph report in office note.  VAS [place] ABI WITH/WO TBI  Result Date: 01/26/2022  LOWER EXTREMITY DOPPLER STUDY Patient Name:  Christian [name]  Date of Exam:   01/26/2022 Medical Rec #: [id number]         Accession #:    [id number] Date of Birth: 1951/09/27         Patient Gender: M Patient Age:   71 years Exam Location:  [facility] Procedure:      VAS [place] ABI WITH/WO  TBI Referring Phys: JULIE HAVILAND --------------------------------------------------------------------------------  Indications: Ulceration, and peripheral artery disease. High Risk Factors: Hypertension, Diabetes, no history of smoking, prior MI,                    coronary artery disease, prior CVA. Other Factors: CHF, CKD3, CABG.  Vascular Interventions: LLE angiogram and attempted PTA balloon angioplasty -                         09/16/21. Comparison Study: Previous exam 08/24/21 Performing Technologist: Ernestene Mention RVT, RDMS  Examination Guidelines: A complete evaluation includes at minimum, Doppler waveform signals and systolic blood pressure reading at the level of bilateral brachial, anterior tibial, and posterior tibial arteries, when vessel segments are accessible. Bilateral testing is considered an integral part of a complete examination. Photoelectric Plethysmograph (PPG) waveforms and toe systolic pressure readings are included as required and additional duplex testing as needed. Limited examinations for reoccurring indications may be performed as noted.  ABI Findings: +---------+------------------+-----+-------------------+--------+  Right     Rt Pressure (mmHg) Index Waveform            Comment   +---------+------------------+-----+-------------------+--------+  Brachial  124                      triphasic                     +---------+------------------+-----+-------------------+--------+  PTA       132                1.02  dampened monophasic           +---------+------------------+-----+-------------------+--------+  DP        92                 0.71  dampened monophasic           +---------+------------------+-----+-------------------+--------+  Great Toe 32                 0.25  Abnormal            severe    +---------+------------------+-----+-------------------+--------+ +---------+------------------+-----+----------+---------------+  Left      Lt Pressure (mmHg) Index Waveform   Comment           +---------+------------------+-----+----------+---------------+  Brachial  129                      triphasic                   +---------+------------------+-----+----------+---------------+  PTA       121                0.94  monophasic                  +---------+------------------+-----+----------+---------------+  DP        111                0.86  monophasic                  +---------+------------------+-----+----------+---------------+  Great Toe 40                 0.31  Abnormal   moderate-severe  +---------+------------------+-----+----------+---------------+ +-------+-----------+-----------+------------+------------+  ABI/TBI Today's ABI Today's TBI Previous ABI Previous TBI  +-------+-----------+-----------+------------+------------+  Right   1.02        0.25        1.08         0.36          +-------+-----------+-----------+------------+------------+  Left    0.94        0.31        0.85         0.39          +-------+-----------+-----------+------------+------------+  Summary: Right: The right toe-brachial index is abnormal (severe disease). 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 Left: The left toe-brachial index is abnormal (moderate/severe disease). 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  *See table(s) above for measurements and observations.  Electronically signed by Waverly Ferrari MD on 01/26/2022 at 6:18:38 PM.    Final    VAS [place] LOWER EXTREMITY VENOUS (DVT) (ONLY [facility] & [facility])  Result Date: 01/26/2022  Lower Venous DVT Study Patient Name:  Christian [name]  Date of Exam:   01/26/2022 Medical Rec #: [id number]         Accession #:    [id number] Date of Birth: [birth year]         Patient Gender: M Patient Age:   84 years Exam Location:  [facility] Procedure:      VAS [place] LOWER EXTREMITY VENOUS (DVT) Referring Phys: JULIE HAVILAND --------------------------------------------------------------------------------  Indications: Edema.  Risk Factors: HX of PE DVT HX (BLE). Anticoagulation: Xarelto. Limitations: Body habitus, poor ultrasound/tissue interface and shadowing. Comparison Study: Previous exam on 03/18/2020 was negative for DVT. Performing Technologist: Ernestene Mention RVT, RDMS  Examination Guidelines: A complete evaluation includes B-mode imaging, spectral Doppler, color Doppler, and power Doppler as needed of all accessible portions of each vessel. Bilateral testing is considered an integral part of a complete examination. Limited examinations for reoccurring indications may be performed as noted. The reflux portion of the exam is performed with the patient in reverse Trendelenburg.  +---------+---------------+---------+-----------+----------+--------------+  RIGHT     Compressibility Phasicity Spontaneity Properties Thrombus Aging  +---------+---------------+---------+-----------+----------+--------------+  CFV       Full            Yes       Yes                                    +---------+---------------+---------+-----------+----------+--------------+  SFJ       Full                                                             +---------+---------------+---------+-----------+----------+--------------+  FV Prox   Full            Yes       Yes                                    +---------+---------------+---------+-----------+----------+--------------+  FV Mid    Full            Yes       Yes                                    +---------+---------------+---------+-----------+----------+--------------+  FV Distal Full            Yes       Yes                                    +---------+---------------+---------+-----------+----------+--------------+  PFV       Full                                                              +---------+---------------+---------+-----------+----------+--------------+  POP       Full            Yes       Yes                                    +---------+---------------+---------+-----------+----------+--------------+  PTV       Full                                                             +---------+---------------+---------+-----------+----------+--------------+  PERO      Full                                                             +---------+---------------+---------+-----------+----------+--------------+   +---------+---------------+---------+-----------+----------+--------------+  LEFT      Compressibility Phasicity Spontaneity Properties Thrombus Aging  +---------+---------------+---------+-----------+----------+--------------+  CFV       Full            Yes       Yes                                    +---------+---------------+---------+-----------+----------+--------------+  SFJ       Full                                                             +---------+---------------+---------+-----------+----------+--------------+  FV Prox   Full            Yes       Yes                                    +---------+---------------+---------+-----------+----------+--------------+  FV Mid    Full            Yes       Yes                                    +---------+---------------+---------+-----------+----------+--------------+  FV Distal Full            Yes       Yes                                    +---------+---------------+---------+-----------+----------+--------------+  PFV       Full                                                             +---------+---------------+---------+-----------+----------+--------------+  POP       Full            Yes       Yes                                    +---------+---------------+---------+-----------+----------+--------------+  PTV       Full                                                              +---------+---------------+---------+-----------+----------+--------------+  PERO      Full                                                             +---------+---------------+---------+-----------+----------+--------------+     Summary: BILATERAL: - No evidence of deep vein thrombosis seen in the lower extremities, bilaterally. - No evidence of superficial venous thrombosis in the lower extremities, bilaterally. -No evidence of popliteal cyst, bilaterally. - Subcutaneous edema, bilaterally. - Heterogenous areas in area of groin with significant posterior shadowing seen bilaterally.  *See table(s) above for measurements and observations. Electronically signed by Waverly Ferrari MD on 01/26/2022 at 6:19:08 PM.    Final    (Echo, Carotid, EGD, Colonoscopy, ERCP)    Subjective: No complaints feels great.  Discharge Exam: Vitals:   01/28/22 0400 01/28/22 0802  BP: 116/77 (!) 142/81  Pulse:  86  Resp: 19 20  Temp:  98.6 F (37 C)  SpO2:  95%   Vitals:   01/27/22 2341 01/28/22 0336 01/28/22 0400 01/28/22 0802  BP: 120/70 (!) 147/85  116/77 (!) 142/81  Pulse: 79 79  86  Resp: 15 (!) 21 19 20   Temp: 98.6 F (37 C) 98.4 F (36.9 C)  98.6 F (37 C)  TempSrc: Oral Oral  Axillary  SpO2: 95% 98%  95%  Weight:      Height:        General: Pt is alert, awake, not in acute distress Cardiovascular: RRR, S1/S2 +, no rubs, no gallops Respiratory: CTA bilaterally, no wheezing, no rhonchi Abdominal: Soft, NT, ND, bowel sounds + Extremities: no edema, no cyanosis    The results of significant diagnostics from this hospitalization (including imaging, microbiology, ancillary and laboratory) are listed below for reference.     Microbiology: Recent Results (from the past 240 hour(s))  Resp Panel by RT-PCR (Flu A&B, Covid)     Status: None   Collection Time: 01/26/22 11:57 AM   Specimen: Nasopharyngeal(NP) swabs in vial transport medium  Result Value Ref Range Status   SARS Coronavirus 2  by RT PCR NEGATIVE NEGATIVE Final    Comment: (NOTE) SARS-CoV-2 target nucleic acids are NOT DETECTED.  The SARS-CoV-2 RNA is generally detectable in upper respiratory specimens during the acute phase of infection. The lowest concentration of SARS-CoV-2 viral copies this assay can detect is 138 copies/mL. A negative result does not preclude SARS-Cov-2 infection and should not be used as the sole basis for treatment or other patient management decisions. A negative result may occur with  improper specimen collection/handling, submission of specimen other than nasopharyngeal swab, presence of viral mutation(s) within the areas targeted by this assay, and inadequate number of viral copies(<138 copies/mL). A negative result must be combined with clinical observations, patient history, and epidemiological information. The expected result is Negative.  Fact Sheet for Patients:  01/28/22  Fact Sheet for Healthcare Providers:  BloggerCourse.com  This test is no t yet approved or cleared by the [employer] FDA and  has been authorized for detection and/or diagnosis of SARS-CoV-2 by FDA under an Emergency Use Authorization (EUA). This EUA will remain  in effect (meaning this test can be used) for the duration of the COVID-19 declaration under Section 564(b)(1) of the Act, 21 U.S.C.section 360bbb-3(b)(1), unless the authorization is terminated  or revoked sooner.       Influenza A by PCR NEGATIVE NEGATIVE Final   Influenza B by PCR NEGATIVE NEGATIVE Final    Comment: (NOTE) The Xpert Xpress SARS-CoV-2/FLU/RSV plus assay is intended as an aid in the diagnosis of influenza from Nasopharyngeal swab specimens and should not be used as a sole basis for treatment. Nasal washings and aspirates are unacceptable for Xpert Xpress SARS-CoV-2/FLU/RSV testing.  Fact Sheet for Patients: [place]  Fact  Sheet for Healthcare Providers: BloggerCourse.com  This test is not yet approved or cleared by the [employer] FDA and has been authorized for detection and/or diagnosis of SARS-CoV-2 by FDA under an Emergency Use Authorization (EUA). This EUA will remain in effect (meaning this test can be used) for the duration of the COVID-19 declaration under Section 564(b)(1) of the Act, 21 U.S.C. section 360bbb-3(b)(1), unless the authorization is terminated or revoked.  Performed at [facility], [address], [place], [place]   Blood culture (routine x 2)     Status: None (Preliminary result)   Collection Time: 01/26/22 12:15 PM   Specimen: Left Antecubital; Blood  Result Value Ref Range Status   Specimen Description LEFT ANTECUBITAL  Final   Special Requests   Final  BOTTLES DRAWN AEROBIC AND ANAEROBIC Blood Culture results may not be optimal due to an inadequate volume of blood received in culture bottles   Culture   Final    NO GROWTH < 24 HOURS Performed at [facility], [address] [place], [place] [address]    Report Status PENDING  Incomplete  Blood culture (routine x 2)     Status: None (Preliminary result)   Collection Time: 01/26/22  1:00 PM   Specimen: BLOOD LEFT HAND  Result Value Ref Range Status   Specimen Description BLOOD LEFT HAND  Final   Special Requests   Final    BOTTLES DRAWN AEROBIC ONLY Blood Culture results may not be optimal due to an inadequate volume of blood received in culture bottles   Culture   Final    NO GROWTH < 24 HOURS Performed at [facility], [address], [place], [place] [address]    Report Status PENDING  Incomplete     Labs: BNP (last 3 results) Recent Labs    04/02/21 0455  BNP 455.5*   Basic Metabolic Panel: Recent Labs  Lab 01/26/22 1031 01/27/22 0452 01/28/22 0359  NA 134* 137 136  K 4.4 4.3 4.9  CL 102 105 104  CO2 20* 27 23  GLUCOSE 155* 94 174*  BUN 37*  32* 30*  CREATININE 2.01* 1.87* 1.65*  CALCIUM 8.8* 8.4* 8.8*   Liver Function Tests: Recent Labs  Lab 01/26/22 1031  AST 25  ALT 42  ALKPHOS 64  BILITOT 0.6  PROT 6.8  ALBUMIN 3.2*   No results for input(s): LIPASE, AMYLASE in the last 168 hours. No results for input(s): AMMONIA in the last 168 hours. CBC: Recent Labs  Lab 01/26/22 1031 01/27/22 0452  WBC 5.1 4.4  NEUTROABS 3.0  --   HGB 13.7 12.5*  HCT 46.9 40.9  MCV 84.4 82.0  PLT 182 158   Cardiac Enzymes: No results for input(s): CKTOTAL, CKMB, CKMBINDEX, TROPONINI in the last 168 hours. BNP: Invalid input(s): POCBNP CBG: Recent Labs  Lab 01/27/22 1155 01/27/22 1300 01/27/22 1618 01/27/22 2050 01/28/22 0805  GLUCAP 83 148* 152* 165* 123*   D-Dimer No results for input(s): DDIMER in the last 72 hours. Hgb A1c No results for input(s): HGBA1C in the last 72 hours. Lipid Profile No results for input(s): CHOL, HDL, LDLCALC, TRIG, CHOLHDL, LDLDIRECT in the last 72 hours. Thyroid function studies No results for input(s): TSH, T4TOTAL, T3FREE, THYROIDAB in the last 72 hours.  Invalid input(s): FREET3 Anemia work up No results for input(s): VITAMINB12, FOLATE, FERRITIN, TIBC, IRON, RETICCTPCT in the last 72 hours. Urinalysis    Component Value Date/Time   COLORURINE YELLOW 01/26/2022 1022   APPEARANCEUR CLEAR 01/26/2022 1022   APPEARANCEUR Cloudy 08/01/2014 1614   LABSPEC 1.011 01/26/2022 1022   LABSPEC 1.008 08/01/2014 1614   PHURINE 5.0 01/26/2022 1022   GLUCOSEU >=500 (A) 01/26/2022 1022   GLUCOSEU Negative 08/01/2014 1614   HGBUR NEGATIVE 01/26/2022 1022   BILIRUBINUR NEGATIVE 01/26/2022 1022   BILIRUBINUR Negative 08/01/2014 1614   KETONESUR NEGATIVE 01/26/2022 1022   PROTEINUR NEGATIVE 01/26/2022 1022   UROBILINOGEN 0.2 07/01/2014 1809   NITRITE POSITIVE (A) 01/26/2022 1022   LEUKOCYTESUR MODERATE (A) 01/26/2022 1022   LEUKOCYTESUR Negative 08/01/2014 1614   Sepsis Labs Invalid input(s):  PROCALCITONIN,  WBC,  LACTICIDVEN Microbiology Recent Results (from the past 240 hour(s))  Resp Panel by RT-PCR (Flu A&B, Covid)     Status: None  Collection Time: 01/26/22 11:57 AM   Specimen: Nasopharyngeal(NP) swabs in vial transport medium  Result Value Ref Range Status   SARS Coronavirus 2 by RT PCR NEGATIVE NEGATIVE Final    Comment: (NOTE) SARS-CoV-2 target nucleic acids are NOT DETECTED.  The SARS-CoV-2 RNA is generally detectable in upper respiratory specimens during the acute phase of infection. The lowest concentration of SARS-CoV-2 viral copies this assay can detect is 138 copies/mL. A negative result does not preclude SARS-Cov-2 infection and should not be used as the sole basis for treatment or other patient management decisions. A negative result may occur with  improper specimen collection/handling, submission of specimen other than nasopharyngeal swab, presence of viral mutation(s) within the areas targeted by this assay, and inadequate number of viral copies(<138 copies/mL). A negative result must be combined with clinical observations, patient history, and epidemiological information. The expected result is Negative.  Fact Sheet for Patients:  BloggerCourse.com  Fact Sheet for Healthcare Providers:  SeriousBroker.it  This test is no t yet approved or cleared by the [place] FDA and  has been authorized for detection and/or diagnosis of SARS-CoV-2 by FDA under an Emergency Use Authorization (EUA). This EUA will remain  in effect (meaning this test can be used) for the duration of the COVID-19 declaration under Section 564(b)(1) of the Act, 21 U.S.C.section 360bbb-3(b)(1), unless the authorization is terminated  or revoked sooner.       Influenza A by PCR NEGATIVE NEGATIVE Final   Influenza B by PCR NEGATIVE NEGATIVE Final    Comment: (NOTE) The Xpert Xpress SARS-CoV-2/FLU/RSV plus assay is intended as  an aid in the diagnosis of influenza from Nasopharyngeal swab specimens and should not be used as a sole basis for treatment. Nasal washings and aspirates are unacceptable for Xpert Xpress SARS-CoV-2/FLU/RSV testing.  Fact Sheet for Patients: BloggerCourse.com  Fact Sheet for Healthcare Providers: SeriousBroker.it  This test is not yet approved or cleared by the [place] FDA and has been authorized for detection and/or diagnosis of SARS-CoV-2 by FDA under an Emergency Use Authorization (EUA). This EUA will remain in effect (meaning this test can be used) for the duration of the COVID-19 declaration under Section 564(b)(1) of the Act, 21 U.S.C. section 360bbb-3(b)(1), unless the authorization is terminated or revoked.  Performed at [facility], [address] [place], [place] [address]   Blood culture (routine x 2)     Status: None (Preliminary result)   Collection Time: 01/26/22 12:15 PM   Specimen: Left Antecubital; Blood  Result Value Ref Range Status   Specimen Description LEFT ANTECUBITAL  Final   Special Requests   Final    BOTTLES DRAWN AEROBIC AND ANAEROBIC Blood Culture results may not be optimal due to an inadequate volume of blood received in culture bottles   Culture   Final    NO GROWTH < 24 HOURS Performed at [facility], [address], [place], [place] [address]    Report Status PENDING  Incomplete  Blood culture (routine x 2)     Status: None (Preliminary result)   Collection Time: 01/26/22  1:00 PM   Specimen: BLOOD LEFT HAND  Result Value Ref Range Status   Specimen Description BLOOD LEFT HAND  Final   Special Requests   Final    BOTTLES DRAWN AEROBIC ONLY Blood Culture results may not be optimal due to an inadequate volume of blood received in culture bottles   Culture   Final    NO GROWTH <  24 HOURS Performed at [facility], [address], [place], [place] [address]    Report  Status PENDING  Incomplete      SIGNED:   Marinda Elk, MD  [facility] 01/28/2022, 8:20 AM Pager   If 7PM-7AM, please contact night-coverage [email or web address] Password [email or web address]

## 2022-01-28 NOTE — Progress Notes (Signed)
RE: Christian [name] ?Date of Birth: [birth year] ?Date: 01/28/22 ? ?Please be advised that the above-named patient will require a short-term nursing home stay - anticipated 30 days or less for rehabilitation and strengthening.  The plan is for return home.  ?

## 2022-01-28 NOTE — TOC Transition Note (Signed)
Transition of Care (TOC) - CM/SW Discharge Note ? ? ?Patient Details  ?Name: Christian [name] ?MRN: [id number] ?Date of Birth: 1950/12/16 ? ?Transition of Care (TOC) CM/SW Contact:  ?Harriet Masson, RN ?Phone Number: ?01/28/2022, 10:19 AM ? ? ?Clinical Narrative:    ?Patient stable for discharge. Orders for Home Health PT/OT/RN. Spoke to daughter, [name], and she requested to use [facility] as they have used them in the past. RNCM contacted Stacy at [facility] and they are unable to accept. RNCM notified daughter and she deferred to [facility] to find a highly rated agency. Contacted Cory with [facility] and referral accepted. Patient's ex-wife, Gloriajean Dell, will transport patient home and learn how to do the dressing changes prior to discharge.  ? ? ? ?Final next level of care: Home w Home Health Services ?Barriers to Discharge: Barriers Resolved ? ? ?Patient Goals and CMS Choice ?Patient states their goals for this hospitalization and ongoing recovery are:: return home ?CMS Medicare.gov Compare Post Acute Care list provided to:: Patient Represenative (must comment) (daughter) ?Choice offered to / list presented to : Adult Children ? ?Discharge Placement ?  ?           ?  ? Home w/ HH ?  ?  ? ?Discharge Plan and Services ?  ?Discharge Planning Services: CM Consult ?Post Acute Care Choice: Home Health          ?  ?  ?  ?  ?  ?HH Arranged: RN, PT, OT ?HH Agency: [facility] ?Date HH Agency Contacted: 01/28/22 ?Time HH Agency Contacted: 1019 ?Representative spoke with at [facility] Agency: Kandee Keen ? ?Social Determinants of Health (SDOH) Interventions ?  ? ? ?Readmission Risk Interventions ?Readmission Risk Prevention Plan 01/28/2022  ?Transportation Screening Complete  ?PCP or Specialist Appt within 5-7 Days Complete  ?Home Care Screening Complete  ?Medication Review (RN CM) Complete  ?Some recent data might be hidden  ? ? ? ? ? ?

## 2022-01-29 DIAGNOSIS — E8770 Fluid overload, unspecified: Secondary | ICD-10-CM | POA: Diagnosis not present

## 2022-01-29 DIAGNOSIS — E11622 Type 2 diabetes mellitus with other skin ulcer: Secondary | ICD-10-CM | POA: Diagnosis not present

## 2022-01-29 DIAGNOSIS — N179 Acute kidney failure, unspecified: Secondary | ICD-10-CM | POA: Diagnosis not present

## 2022-01-29 DIAGNOSIS — L89152 Pressure ulcer of sacral region, stage 2: Secondary | ICD-10-CM | POA: Diagnosis not present

## 2022-01-29 DIAGNOSIS — I5043 Acute on chronic combined systolic (congestive) and diastolic (congestive) heart failure: Secondary | ICD-10-CM | POA: Diagnosis not present

## 2022-01-29 DIAGNOSIS — L97518 Non-pressure chronic ulcer of other part of right foot with other specified severity: Secondary | ICD-10-CM | POA: Diagnosis not present

## 2022-01-29 DIAGNOSIS — T148XXA Other injury of unspecified body region, initial encounter: Secondary | ICD-10-CM | POA: Diagnosis not present

## 2022-01-29 DIAGNOSIS — I130 Hypertensive heart and chronic kidney disease with heart failure and stage 1 through stage 4 chronic kidney disease, or unspecified chronic kidney disease: Secondary | ICD-10-CM | POA: Diagnosis not present

## 2022-01-29 DIAGNOSIS — N1832 Chronic kidney disease, stage 3b: Secondary | ICD-10-CM | POA: Diagnosis not present

## 2022-01-29 DIAGNOSIS — Z20822 Contact with and (suspected) exposure to covid-19: Secondary | ICD-10-CM | POA: Diagnosis not present

## 2022-01-29 DIAGNOSIS — E11621 Type 2 diabetes mellitus with foot ulcer: Secondary | ICD-10-CM | POA: Diagnosis not present

## 2022-01-29 DIAGNOSIS — L03119 Cellulitis of unspecified part of limb: Secondary | ICD-10-CM | POA: Diagnosis not present

## 2022-01-29 DIAGNOSIS — F3170 Bipolar disorder, currently in remission, most recent episode unspecified: Secondary | ICD-10-CM | POA: Diagnosis not present

## 2022-01-29 DIAGNOSIS — Z794 Long term (current) use of insulin: Secondary | ICD-10-CM | POA: Diagnosis not present

## 2022-01-29 DIAGNOSIS — F319 Bipolar disorder, unspecified: Secondary | ICD-10-CM | POA: Diagnosis not present

## 2022-01-29 DIAGNOSIS — R262 Difficulty in walking, not elsewhere classified: Secondary | ICD-10-CM | POA: Diagnosis not present

## 2022-01-29 DIAGNOSIS — I2510 Atherosclerotic heart disease of native coronary artery without angina pectoris: Secondary | ICD-10-CM | POA: Diagnosis not present

## 2022-01-29 DIAGNOSIS — N3000 Acute cystitis without hematuria: Secondary | ICD-10-CM | POA: Diagnosis not present

## 2022-01-29 DIAGNOSIS — L97411 Non-pressure chronic ulcer of right heel and midfoot limited to breakdown of skin: Secondary | ICD-10-CM | POA: Diagnosis not present

## 2022-01-29 DIAGNOSIS — L03115 Cellulitis of right lower limb: Secondary | ICD-10-CM | POA: Diagnosis not present

## 2022-01-29 DIAGNOSIS — E119 Type 2 diabetes mellitus without complications: Secondary | ICD-10-CM | POA: Diagnosis not present

## 2022-01-29 DIAGNOSIS — L97529 Non-pressure chronic ulcer of other part of left foot with unspecified severity: Secondary | ICD-10-CM | POA: Diagnosis not present

## 2022-01-29 DIAGNOSIS — I5033 Acute on chronic diastolic (congestive) heart failure: Secondary | ICD-10-CM | POA: Diagnosis not present

## 2022-01-29 DIAGNOSIS — E11628 Type 2 diabetes mellitus with other skin complications: Secondary | ICD-10-CM | POA: Diagnosis not present

## 2022-01-29 DIAGNOSIS — I739 Peripheral vascular disease, unspecified: Secondary | ICD-10-CM | POA: Diagnosis not present

## 2022-01-29 DIAGNOSIS — Z7401 Bed confinement status: Secondary | ICD-10-CM | POA: Diagnosis not present

## 2022-01-29 DIAGNOSIS — L97309 Non-pressure chronic ulcer of unspecified ankle with unspecified severity: Secondary | ICD-10-CM | POA: Diagnosis not present

## 2022-01-29 DIAGNOSIS — N183 Chronic kidney disease, stage 3 unspecified: Secondary | ICD-10-CM | POA: Diagnosis not present

## 2022-01-29 DIAGNOSIS — I872 Venous insufficiency (chronic) (peripheral): Secondary | ICD-10-CM | POA: Diagnosis not present

## 2022-01-29 DIAGNOSIS — M2550 Pain in unspecified joint: Secondary | ICD-10-CM | POA: Diagnosis not present

## 2022-01-29 DIAGNOSIS — E1122 Type 2 diabetes mellitus with diabetic chronic kidney disease: Secondary | ICD-10-CM | POA: Diagnosis not present

## 2022-01-29 DIAGNOSIS — L97819 Non-pressure chronic ulcer of other part of right lower leg with unspecified severity: Secondary | ICD-10-CM | POA: Diagnosis not present

## 2022-01-29 DIAGNOSIS — N189 Chronic kidney disease, unspecified: Secondary | ICD-10-CM | POA: Diagnosis not present

## 2022-01-29 LAB — BASIC METABOLIC PANEL
Anion gap: 7 (ref 5–15)
BUN: 31 mg/dL — ABNORMAL HIGH (ref 8–23)
CO2: 26 mmol/L (ref 22–32)
Calcium: 9.1 mg/dL (ref 8.9–10.3)
Chloride: 103 mmol/L (ref 98–111)
Creatinine, Ser: 1.72 mg/dL — ABNORMAL HIGH (ref 0.61–1.24)
GFR, Estimated: 43 mL/min — ABNORMAL LOW (ref >60)
Glucose, Bld: 138 mg/dL — ABNORMAL HIGH (ref 70–99)
Potassium: 4.3 mmol/L (ref 3.5–5.1)
Sodium: 136 mmol/L (ref 135–145)

## 2022-01-29 LAB — GLUCOSE, CAPILLARY
Glucose-Capillary: 279 mg/dL — ABNORMAL HIGH (ref 70–99)
Glucose-Capillary: 149 mg/dL — ABNORMAL HIGH (ref 70–99)

## 2022-01-29 NOTE — TOC Progression Note (Signed)
Transition of Care (TOC) - Progression Note  ? ? ?Patient Details  ?Name: Christian [name] ?MRN: [id number] ?Date of Birth: 1951-05-19 ? ?Transition of Care (TOC) CM/SW Contact  ?Mearl Latin, LCSW ?Phone Number: ?01/29/2022, 9:29 AM ? ?Clinical Narrative:    ?Insurance approval received for [facility], Ref# [id number], effective 01/29/2022-02/02/2022. Facility aware and ready for patient.  ? ? ?Expected Discharge Plan: Skilled Nursing Facility ?Barriers to Discharge: Insurance Authorization ? ?Expected Discharge Plan and Services ?Expected Discharge Plan: Skilled Nursing Facility ?In-house Referral: Clinical Social Work ?Discharge Planning Services: CM Consult ?Post Acute Care Choice: Skilled Nursing Facility ?Living arrangements for the past 2 months: Apartment ?Expected Discharge Date: 01/29/22               ?  ?  ?  ?  ?  ?HH Arranged: RN, PT, OT ?HH Agency: [facility] ?Date HH Agency Contacted: 01/28/22 ?Time HH Agency Contacted: 1019 ?Representative spoke with at [facility] Agency: Kandee Keen ? ? ?Social Determinants of Health (SDOH) Interventions ?  ? ?Readmission Risk Interventions ?Readmission Risk Prevention Plan 01/28/2022  ?Transportation Screening Complete  ?PCP or Specialist Appt within 5-7 Days Complete  ?Home Care Screening Complete  ?Medication Review (RN CM) Complete  ?Some recent data might be hidden  ? ? ?

## 2022-01-29 NOTE — Progress Notes (Signed)
Physical Therapy Treatment ?Patient Details ?Name: Christian [name] ?MRN: [id number] ?DOB: Dec 28, 1950 ?Today's Date: 01/29/2022 ? ? ?History of Present Illness 71 y.o. male presented 01/26/22 with worsening of right leg infection. Acute on chronic HF  PMH significant of chronic venous stasis and chronic right shin unhealing wound, chronic HFpEF, IDDM, CAD with stenting x2 in 2021, PVD, CKD stage IIIa, HTN, DVT on Xarelto, bipolar disorder, OSA on CPAP at bedtime ? ?  ?PT Comments  ? ? Pt required +2 mod assist sit to stand, and +2 mod assist SPT with RW. Pt performed BLE exercises in recliner. Pt in recliner with feet elevated at end of session. ?   ?Recommendations for follow up therapy are one component of a multi-disciplinary discharge planning process, led by the attending physician.  Recommendations may be updated based on patient status, additional functional criteria and insurance authorization. ? ?Follow Up Recommendations ? Skilled nursing-short term rehab (<3 hours/day) ?  ?  ?Assistance Recommended at Discharge Frequent or constant Supervision/Assistance  ?Patient can return home with the following Two people to help with walking and/or transfers;Two people to help with bathing/dressing/bathroom;Assist for transportation ?  ?Equipment Recommendations ? Wheelchair (measurements PT);Wheelchair cushion (measurements PT)  ?  ?Recommendations for Other Services   ? ? ?  ?Precautions / Restrictions Precautions ?Precautions: Fall ?Precaution Comments: reports last fall was 6-8 months ago (missed edge of couch as he went to sit down) ?Restrictions ?RLE Weight Bearing: Weight bearing as tolerated  ?  ? ?Mobility ? Bed Mobility ?  ?  ?  ?  ?  ?  ?  ?General bed mobility comments: Pt in recliner on arrival. ?  ? ?Transfers ?Overall transfer level: Needs assistance ?Equipment used: Rolling walker (2 wheels) ?Transfers: Sit to/from Stand, Bed to chair/wheelchair/BSC ?Sit to Stand: +2 physical assistance, Mod assist ?   ?Step pivot transfers: +2 physical assistance, Mod assist ?  ?  ?  ?General transfer comment: cues for sequencing and hand placement, increased time to power up. Also limited by R knee pain. ?  ? ?Ambulation/Gait ?  ?  ?  ?  ?  ?  ?  ?General Gait Details: unable ? ? ?Stairs ?  ?  ?  ?  ?  ? ? ?Wheelchair Mobility ?  ? ?Modified Rankin (Stroke Patients Only) ?  ? ? ?  ?Balance Overall balance assessment: Needs assistance ?Sitting-balance support: No upper extremity supported, Feet supported ?Sitting balance-Leahy Scale: Fair ?  ?  ?Standing balance support: Bilateral upper extremity supported, Reliant on assistive device for balance, During functional activity ?Standing balance-Leahy Scale: Poor ?  ?  ?  ?  ?  ?  ?  ?  ?  ?  ?  ?  ?  ? ?  ?Cognition Arousal/Alertness: Awake/alert ?Behavior During Therapy: Flat affect ?Overall Cognitive Status: No family/caregiver present to determine baseline cognitive functioning ?Area of Impairment: Safety/judgement, Problem solving ?  ?  ?  ?  ?  ?  ?  ?  ?  ?  ?  ?  ?Safety/Judgement: Decreased awareness of deficits ?  ?Problem Solving: Difficulty sequencing, Requires verbal cues ?  ?  ?  ? ?  ?Exercises General Exercises - Lower Extremity ?Ankle Circles/Pumps: AROM, Both, 10 reps ?Heel Slides: AAROM, Right, Left, 10 reps ?Hip ABduction/ADduction: AAROM, Right, Left, 10 reps ? ?  ?General Comments   ?  ?  ? ?Pertinent Vitals/Pain Pain Assessment ?Pain Assessment: Faces ?Faces Pain Scale: Hurts even more ?Pain Location:  R knee ?Pain Descriptors / Indicators: Tender, Grimacing, Guarding, Sore ?Pain Intervention(s): Limited activity within patient's tolerance, Monitored during session, Heat applied  ? ? ?Home Living   ?  ?  ?  ?  ?  ?  ?  ?  ?  ?   ?  ?Prior Function    ?  ?  ?   ? ?PT Goals (current goals can now be found in the care plan section) Acute Rehab PT Goals ?Patient Stated Goal: home ?Progress towards PT goals: Progressing toward goals ? ?  ?Frequency ? ? ? Min  3X/week ? ? ? ?  ?PT Plan Current plan remains appropriate  ? ? ?Co-evaluation   ?  ?  ?  ?  ? ?  ?AM-PAC PT "6 Clicks" Mobility   ?Outcome Measure ? Help needed turning from your back to your side while in a flat bed without using bedrails?: A Little ?Help needed moving from lying on your back to sitting on the side of a flat bed without using bedrails?: A Lot ?Help needed moving to and from a bed to a chair (including a wheelchair)?: Total ?Help needed standing up from a chair using your arms (e.g., wheelchair or bedside chair)?: Total ?Help needed to walk in hospital room?: Total ?Help needed climbing 3-5 steps with a railing? : Total ?6 Click Score: 9 ? ?  ?End of Session Equipment Utilized During Treatment: Gait belt ?Activity Tolerance: Patient tolerated treatment well ?Patient left: in chair;with call bell/phone within reach;with chair alarm set ?Nurse Communication: Mobility status ?PT Visit Diagnosis: Other abnormalities of gait and mobility (R26.89);Muscle weakness (generalized) (M62.81);History of falling (Z91.81) ?  ? ? ?Time: [id number] ?PT Time Calculation (min) (ACUTE ONLY): 24 min ? ?Charges:  $Therapeutic Exercise: 8-22 mins ?$Therapeutic Activity: 8-22 mins          ?          ? ?Lorrin Goodell, PT  ?Office # [phone] ?Pager [phone] ? ? ? ?Lorriane Shire ?01/29/2022, 11:50 AM ? ?

## 2022-01-29 NOTE — TOC Transition Note (Signed)
Transition of Care (TOC) - CM/SW Discharge Note ? ? ?Patient Details  ?Name: Christian [name] ?MRN: [id number] ?Date of Birth: August 30, 1951 ? ?Transition of Care (TOC) CM/SW Contact:  ?Benard Halsted, LCSW ?Phone Number: ?01/29/2022, 1:03 PM ? ? ?Clinical Narrative:    ?Patient will DC to: [facility] ?Anticipated DC date: 01/29/22 ?Family notified: Daughter, Christian [name] ?Transport by: [name] ? ? ?Per MD patient ready for DC to [facility] RN to call report prior to discharge [phone] Room 218). RN, patient, patient's family, and facility notified of DC. Discharge Summary and FL2 sent to facility. DC packet on chart. Ambulance transport requested for patient.  ? ?CSW will sign off for now as social work intervention is no longer needed. Please consult [place] again if new needs arise. ? ? ? ? ?Final next level of care: [place] ?Barriers to Discharge: Barriers Resolved ? ? ?Patient Goals and CMS Choice ?Patient states their goals for this hospitalization and ongoing recovery are:: return home ?CMS Medicare.gov Compare Post Acute Care list provided to:: Patient Represenative (must comment) (daughter) ?Choice offered to / list presented to : Adult Children ? ?Discharge Placement ?PASRR number recieved: 01/29/22 ?           ?Patient [facility] bed at: [facility] ?Patient to be transferred to facility by: [facility] ?Name of family member notified: Guardian, daughter ?Patient and family notified of of transfer: 01/29/22 ? ?Discharge Plan and Services ?In-house Referral: Clinical Social Work ?Discharge Planning Services: CM Consult ?Post Acute Care Choice: [facility]          ?  ?  ?  ?  ?  ?HH Arranged: RN, PT, OT ?[place] Agency: [place] ?Date HH Agency Contacted: 01/28/22 ?Time [place]: [id number] spoke with at [place]: Tommi Rumps ? ?Social Determinants of Health (SDOH) Interventions ?  ? ? ?Readmission Risk Interventions ?Readmission Risk Prevention Plan 01/28/2022   ?Transportation Screening Complete  ?PCP or Specialist Appt within 5-7 Days Complete  ?Home Care Screening Complete  ?Medication Review (RN CM) Complete  ?Some recent data might be hidden  ? ? ? ? ? ?

## 2022-01-29 NOTE — Discharge Summary (Addendum)
Physician Discharge Summary  Leor Whyte Esau XHB:[id number] DOB: 12/03/1950 DOA: 01/26/2022  PCP: Donald Prose, MD  Admit date: 01/26/2022 Discharge date: 01/29/2022  Admitted From: Home Disposition:  SNF  Recommendations for Outpatient Follow-up:  Follow up with PCP in 1-2 weeks Please obtain BMP/CBC in one week Wound type: Full thickness wound to right inner calf, red and moist. Dressing procedure/placement/frequency: Topical treatment orders provided for bedside nurses to perform to absorb drainage and provide antimicrobial benefits as follows: Apply Aquacel Kellie Simmering # [phone]) to right leg wound Q day, then cover with foam dressing.  (Change foam dressing Q 3 days or PRN soiling.) Moisten with NS to remove previous dressing each time.   Home Health:No Equipment/Devices:none  Discharge Condition:stable CODE STATUS:Full Diet recommendation: Heart Healthy   Brief/Interim Summary: 71 y.o. male past medical history significant for chronic venous stasis, chronic right shin and heel ulcer, chronic preserved heart failure, insulin-dependent diabetes mellitus, CAD with stenting x2 in 2021, PVD, chronic kidney disease stage IIIa, DVT on Xarelto bipolar disorder obstructive sleep apnea presents with worsening right leg infection, vascular was consulted recommended not to repeat an angiogram or bypass surgical intervention, recommended elevation of the legs and compression but due to his peripheral arterial disease will recommend mild elevation of the legs with mild compression.  We will follow-up with them as an outpatient.  Podiatrist was consulted local wound care with antibiotics.  Weightbearing as tolerated, unfortunately he is high risk for major amputation.  Discharge Diagnoses:  Principal Problem:   Diabetic ulcer of ankle ([place]) Active Problems:   Cellulitis in diabetic foot ([place])  Right shin diabetic foot ulcer: Started on IV vancomycin and cefepime, MRSA screening was negative. Vascular  was consulted recommended no further surgical intervention as well as podiatrist. His Tmax was 98.7 with no leukocytosis. He was transitioned to oral doxycycline which she will continue as an outpatient. Physical therapy evaluated the patient, he will need to go to short-term rehab as he needed to assist even to get out of bed. We will also have home health help do wound care at at home. Vascular surgery recommended to follow-up with them as an outpatient for venous duplex.  Acute on chronic decompensated diastolic heart failure: He appears slightly fluid overloaded on physical exam he was started on IV Lasix about a liter and a half, he will continue his Lasix dose of 40 mg twice a day for 3 days then go back to 40 mg daily at home. 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 She will follow-up with PCP basic metabolic panel in 1 to 2 weeks.  Peripheral vascular disease: No surgical intervention needed continue aspirin follow-up with vascular as an outpatient.  Diabetes mellitus type 2: Blood glucose fairly controlled no changes made to his medication.  History of DVT: Continue Xarelto as an outpatient.  Sacral decubitus ulcer stage II present on admission: Dressing as directed and turn every 2 hours home health to follow-up with him as an outpatient.  Discharge Instructions  Discharge Instructions     Diet - low sodium heart healthy   Complete by: As directed    Diet - low sodium heart healthy   Complete by: As directed    Increase activity slowly   Complete by: As directed    Increase  activity slowly   Complete by: As directed    No wound care   Complete by: As directed    No wound care   Complete by: As directed       Allergies as of 01/29/2022       Reactions    Amlodipine Swelling, Other (See Comments)   Legs became swollen   Onglyza [saxagliptin] Other (See Comments)   "Allergic," per [place]   Zyprexa [olanzapine] Other (See Comments)   Allergy noted by ACT Team. No reaction specified (??)- "Allergic," per [place]   Ritalin [methylphenidate] Anxiety   "Maniac"        Medication List     TAKE these medications    acetaminophen 500 MG tablet Commonly known as: TYLENOL Take 500-1,000 mg by mouth every 6 (six) hours as needed (pain.).   ARIPiprazole 20 MG tablet Commonly known as: ABILIFY Take 1 tablet (20 mg total) by mouth daily.   aspirin EC 81 MG tablet Take 81 mg by mouth daily. Swallow whole.   atorvastatin 10 MG tablet Commonly known as: LIPITOR Take 1 tablet (10 mg total) by mouth daily at 6 PM. What changed: when to take this   buPROPion 75 MG tablet Commonly known as: WELLBUTRIN Take 1 tablet (75 mg total) by mouth daily.   divalproex 500 MG 24 hr tablet Commonly known as: DEPAKOTE ER Take 500 mg by mouth in the morning and at bedtime.   doxycycline 100 MG tablet Commonly known as: VIBRA-TABS Take 1 tablet (100 mg total) by mouth every 12 (twelve) hours for 6 days.   empagliflozin 10 MG Tabs tablet Commonly known as: JARDIANCE Take 10 mg by mouth daily.   furosemide 40 MG tablet Commonly known as: LASIX Take Lasix 40 mg twice a day for 3 days then go back to Lasix 40 mg daily What changed:  how much to take how to take this when to take this additional instructions   glimepiride 2 MG tablet Commonly known as: AMARYL Take 2 mg by mouth daily.   insulin glargine 100 UNIT/ML injection Commonly known as: LANTUS Inject 10 Units into the skin daily.   liothyronine 5 MCG tablet Commonly known as: CYTOMEL Take 10 mcg by mouth daily.   metoprolol succinate 50 MG 24 hr tablet Commonly known as: TOPROL-XL Take 1 tablet (50 mg total) by mouth daily. Take with or immediately  following a meal. What changed: Another medication with the same name was removed. Continue taking this medication, and follow the directions you see here.   mirtazapine 15 MG tablet Commonly known as: REMERON Take 15 mg by mouth at bedtime.   mupirocin ointment 2 % Commonly known as: BACTROBAN APPLY 1 APPLICATION TOPICALLY DAILY. APPLIED TO TOE WOUND What changed:  when to take this additional instructions   polyethylene glycol powder 17 GM/SCOOP powder Commonly known as: GLYCOLAX/MIRALAX Take 17 g by mouth daily. What changed:  when to take this reasons to take this   QUEtiapine 50 MG tablet Commonly known as: SEROQUEL Take 50 mg by mouth at bedtime.   rivaroxaban 20 MG Tabs tablet Commonly known as: XARELTO Take 1 tablet (20 mg total) by mouth daily with supper. What changed: when to take this        Contact information for follow-up providers     Angelia Mould, MD Follow up in 2 week(s).   Specialties: Vascular Surgery, Cardiology Why: Office will call you to arrange your appt (sent) Contact  information: [address] [place] [address] [place], [facility] Follow up.   Specialty: Home Health Services Why: HH-PT/OT/RN arranged. They will contact you to schedule apt. Contact information: [place] [address] [phone]         Donald Prose, MD Follow up in 1 week(s).   Specialty: Family Medicine Why: Hospital follow up Contact information: [place] [address] [phone]              Contact information for after-discharge care     Destination     [facility] .   Service: Skilled Nursing Contact information: [place] [phone]                    Allergies  Allergen Reactions   Amlodipine Swelling and Other (See Comments)    Legs became swollen   Onglyza  [Saxagliptin] Other (See Comments)    "Allergic," per [place]   Zyprexa [Olanzapine] Other (See Comments)    Allergy noted by ACT Team. No reaction specified (??)- "Allergic," per [place]   Ritalin [Methylphenidate] Anxiety    "Maniac"    Consultations: Vascular surgery Podiatrist   Procedures/Studies: DG Chest Port 1 View  Result Date: 01/26/2022 CLINICAL DATA:  Congestive heart failure. EXAM: PORTABLE CHEST 1 VIEW COMPARISON:  Apr 02, 2021. FINDINGS: The heart size and mediastinal contours are within normal limits. Both lungs are clear. The visualized skeletal structures are unremarkable. IMPRESSION: No active disease. Electronically Signed   By: Marijo Conception M.D.   On: 01/26/2022 15:44   DG Foot Complete Right  Result Date: 01/26/2022 Please see detailed radiograph report in office note.  DG Foot Complete Right  Result Date: 01/26/2022 Please see detailed radiograph report in office note.  VAS [place] ABI WITH/WO TBI  Result Date: 01/26/2022  LOWER EXTREMITY DOPPLER STUDY Patient Name:  Edgardo Petrenko Tolen  Date of Exam:   01/26/2022 Medical Rec #: [id number]         Accession #:    [id number] Date of Birth: 11/22/1951         Patient Gender: M Patient Age:   49 years Exam Location:  [facility] Procedure:      VAS [place] ABI WITH/WO TBI Referring Phys: JULIE HAVILAND --------------------------------------------------------------------------------  Indications: Ulceration, and peripheral artery disease. High Risk Factors: Hypertension, Diabetes, no history of smoking, prior MI,                    coronary artery disease, prior CVA. Other Factors: CHF, CKD3, CABG.  Vascular Interventions: LLE angiogram and attempted PTA balloon angioplasty -                         09/16/21. Comparison Study: Previous exam 08/24/21 Performing Technologist: Rogelia Rohrer RVT, RDMS  Examination Guidelines: A complete evaluation includes at minimum, Doppler waveform signals and systolic  blood pressure reading at the level of bilateral brachial, anterior tibial, and posterior tibial arteries, when vessel segments are accessible. Bilateral testing is considered an integral part of a complete examination. Photoelectric Plethysmograph (PPG) waveforms and toe systolic pressure readings are included as required and additional duplex testing as needed. Limited examinations for reoccurring indications may be performed as noted.  ABI Findings: +---------+------------------+-----+-------------------+--------+  Right     Rt  Pressure (mmHg) Index Waveform            Comment   +---------+------------------+-----+-------------------+--------+  Brachial  124                      triphasic                     +---------+------------------+-----+-------------------+--------+  PTA       132                1.02  dampened monophasic           +---------+------------------+-----+-------------------+--------+  DP        92                 0.71  dampened monophasic           +---------+------------------+-----+-------------------+--------+  Great Toe 32                 0.25  Abnormal            severe    +---------+------------------+-----+-------------------+--------+ +---------+------------------+-----+----------+---------------+  Left      Lt Pressure (mmHg) Index Waveform   Comment          +---------+------------------+-----+----------+---------------+  Brachial  129                      triphasic                   +---------+------------------+-----+----------+---------------+  PTA       121                0.94  monophasic                  +---------+------------------+-----+----------+---------------+  DP        111                0.86  monophasic                  +---------+------------------+-----+----------+---------------+  Great Toe 40                 0.31  Abnormal   moderate-severe  +---------+------------------+-----+----------+---------------+ +-------+-----------+-----------+------------+------------+   ABI/TBI Today's ABI Today's TBI Previous ABI Previous TBI  +-------+-----------+-----------+------------+------------+  Right   1.02        0.25        1.08         0.36          +-------+-----------+-----------+------------+------------+  Left    0.94        0.31        0.85         0.39          +-------+-----------+-----------+------------+------------+  Summary: Right: The right toe-brachial index is abnormal (severe disease). 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 Left: The left toe-brachial index is abnormal (moderate/severe disease). 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  *See table(s) above for measurements and observations.  Electronically signed by Deitra Mayo MD on 01/26/2022 at 82:18:38 PM.    Final    VAS [place] LOWER EXTREMITY VENOUS (DVT) (ONLY [facility] & [facility])  Result Date: 01/26/2022  Lower Venous DVT Study Patient Name:  HERSHALL BENKERT  Date of Exam:   01/26/2022 Medical Rec #: [id number]  Accession #:    [id number] Date of Birth: Nov 19, 1951         Patient Gender: M Patient Age:   31 years Exam Location:  [facility] Procedure:      VAS [place] LOWER EXTREMITY VENOUS (DVT) Referring Phys: JULIE HAVILAND --------------------------------------------------------------------------------  Indications: Edema.  Risk Factors: HX of PE DVT HX (BLE). Anticoagulation: Xarelto. Limitations: Body habitus, poor ultrasound/tissue interface and shadowing. Comparison Study: Previous exam on 03/18/2020 was negative for DVT. Performing Technologist: Rogelia Rohrer RVT, RDMS  Examination Guidelines: A complete evaluation includes B-mode imaging, spectral Doppler, color Doppler, and power Doppler as needed of all accessible portions of each vessel. Bilateral testing  is considered an integral part of a complete examination. Limited examinations for reoccurring indications may be performed as noted. The reflux portion of the exam is performed with the patient in reverse Trendelenburg.  +---------+---------------+---------+-----------+----------+--------------+  RIGHT     Compressibility Phasicity Spontaneity Properties Thrombus Aging  +---------+---------------+---------+-----------+----------+--------------+  CFV       Full            Yes       Yes                                    +---------+---------------+---------+-----------+----------+--------------+  SFJ       Full                                                             +---------+---------------+---------+-----------+----------+--------------+  FV Prox   Full            Yes       Yes                                    +---------+---------------+---------+-----------+----------+--------------+  FV Mid    Full            Yes       Yes                                    +---------+---------------+---------+-----------+----------+--------------+  FV Distal Full            Yes       Yes                                    +---------+---------------+---------+-----------+----------+--------------+  PFV       Full                                                             +---------+---------------+---------+-----------+----------+--------------+  POP       Full            Yes       Yes                                    +---------+---------------+---------+-----------+----------+--------------+  PTV       Full                                                             +---------+---------------+---------+-----------+----------+--------------+  PERO      Full                                                             +---------+---------------+---------+-----------+----------+--------------+   +---------+---------------+---------+-----------+----------+--------------+  LEFT       Compressibility Phasicity Spontaneity Properties Thrombus Aging  +---------+---------------+---------+-----------+----------+--------------+  CFV       Full            Yes       Yes                                    +---------+---------------+---------+-----------+----------+--------------+  SFJ       Full                                                             +---------+---------------+---------+-----------+----------+--------------+  FV Prox   Full            Yes       Yes                                    +---------+---------------+---------+-----------+----------+--------------+  FV Mid    Full            Yes       Yes                                    +---------+---------------+---------+-----------+----------+--------------+  FV Distal Full            Yes       Yes                                    +---------+---------------+---------+-----------+----------+--------------+  PFV       Full                                                             +---------+---------------+---------+-----------+----------+--------------+  POP       Full            Yes       Yes                                    +---------+---------------+---------+-----------+----------+--------------+  PTV       Full                                                             +---------+---------------+---------+-----------+----------+--------------+  PERO      Full                                                             +---------+---------------+---------+-----------+----------+--------------+     Summary: BILATERAL: - No evidence of deep vein thrombosis seen in the lower extremities, bilaterally. - No evidence of superficial venous thrombosis in the lower extremities, bilaterally. -No evidence of popliteal cyst, bilaterally. - Subcutaneous edema, bilaterally. - Heterogenous areas in area of groin with significant posterior shadowing seen bilaterally.  *See table(s) above for measurements and observations. Electronically  signed by Deitra Mayo MD on 01/26/2022 at 6:19:08 PM.    Final    (Echo, Carotid, EGD, Colonoscopy, ERCP)    Subjective: No complaints feels great.  Discharge Exam: Vitals:   01/29/22 0300 01/29/22 0742  BP: 116/73 (!) 143/56  Pulse: 87 97  Resp: 20 19  Temp: 98.3 F (36.8 C) 99.1 F (37.3 C)  SpO2: 100% 90%   Vitals:   01/28/22 2303 01/28/22 2356 01/29/22 0300 01/29/22 0742  BP:  131/85 116/73 (!) 143/56  Pulse: 83 83 87 97  Resp: _0 Temp:  98.1 F (36.7 C) 98.3 F (36.8 C) 99.1 F (37.3 C)  TempSrc:  Oral Oral Oral  SpO2: 97% 100% 100% 90%  Weight:      Height:        General: Pt is alert, awake, not in acute distress Cardiovascular: RRR, S1/S2 +, no rubs, no gallops Respiratory: CTA bilaterally, no wheezing, no rhonchi Abdominal: Soft, NT, ND, bowel sounds + Extremities: no edema, no cyanosis    The results of significant diagnostics from this hospitalization (including imaging, microbiology, ancillary and laboratory) are listed below for reference.     Microbiology: Recent Results (from the past 240 hour(s))  Resp Panel by RT-PCR (Flu A&B, Covid)     Status: None   Collection Time: 01/26/22 11:57 AM   Specimen: Nasopharyngeal(NP) swabs in vial transport medium  Result Value Ref Range Status   SARS Coronavirus 2 by RT PCR NEGATIVE NEGATIVE Final    Comment: (NOTE) SARS-CoV-2 target nucleic acids are NOT DETECTED.  The SARS-CoV-2 RNA is generally detectable in upper respiratory specimens during the acute phase of infection. The lowest concentration of SARS-CoV-2 viral copies this assay can detect is 138 copies/mL. A negative result does not preclude SARS-Cov-2 infection and should not be used as the sole basis for treatment or other patient management decisions. A negative result may occur with  improper specimen collection/handling, submission of specimen other than nasopharyngeal swab, presence of viral mutation(s) within the areas  targeted by this assay, and inadequate number of viral copies(<138 copies/mL). A negative result must be combined with clinical observations, patient history, and epidemiological information. The expected result is Negative.  Fact Sheet for Patients:  EntrepreneurPulse.com.au  Fact Sheet for Healthcare Providers:  IncredibleEmployment.be  This test  is no t yet approved or cleared by the [place] and  has been authorized for detection and/or diagnosis of SARS-CoV-2 by FDA under an Emergency Use Authorization (EUA). This EUA will remain  in effect (meaning this test can be used) for the duration of the COVID-19 declaration under Section 564(b)(1) of the Act, 21 U.S.C.section 360bbb-3(b)(1), unless the authorization is terminated  or revoked sooner.       Influenza A by PCR NEGATIVE NEGATIVE Final   Influenza B by PCR NEGATIVE NEGATIVE Final    Comment: (NOTE) The Xpert Xpress SARS-CoV-2/FLU/RSV plus assay is intended as an aid in the diagnosis of influenza from Nasopharyngeal swab specimens and should not be used as a sole basis for treatment. Nasal washings and aspirates are unacceptable for Xpert Xpress SARS-CoV-2/FLU/RSV testing.  Fact Sheet for Patients: EntrepreneurPulse.com.au  Fact Sheet for Healthcare Providers: IncredibleEmployment.be  This test is not yet approved or cleared by the [place] FDA and has been authorized for detection and/or diagnosis of SARS-CoV-2 by FDA under an Emergency Use Authorization (EUA). This EUA will remain in effect (meaning this test can be used) for the duration of the COVID-19 declaration under Section 564(b)(1) of the Act, 21 U.S.C. section 360bbb-3(b)(1), unless the authorization is terminated or revoked.  Performed at [facility], [place] [address] [place], [place] [address]   Blood culture (routine x 2)     Status: None (Preliminary result)    Collection Time: 01/26/22 12:15 PM   Specimen: Left Antecubital; Blood  Result Value Ref Range Status   Specimen Description LEFT ANTECUBITAL  Final   Special Requests   Final    BOTTLES DRAWN AEROBIC AND ANAEROBIC Blood Culture results may not be optimal due to an inadequate volume of blood received in culture bottles   Culture   Final    NO GROWTH 3 DAYS Performed at [facility], [place] [address], [place], [place] [address]    Report Status PENDING  Incomplete  Blood culture (routine x 2)     Status: None (Preliminary result)   Collection Time: 01/26/22  1:00 PM   Specimen: BLOOD LEFT HAND  Result Value Ref Range Status   Specimen Description BLOOD LEFT HAND  Final   Special Requests   Final    BOTTLES DRAWN AEROBIC ONLY Blood Culture results may not be optimal due to an inadequate volume of blood received in culture bottles   Culture   Final    NO GROWTH 3 DAYS Performed at [facility], [place] [address], [place], [place] [address]    Report Status PENDING  Incomplete     Labs: BNP (last 3 results) Recent Labs    04/02/21 0455  BNP 646.8*   Basic Metabolic Panel: Recent Labs  Lab 01/26/22 1031 01/27/22 0452 01/28/22 0359 01/29/22 0034  NA 134* 137 136 136  K 4.4 4.3 4.9 4.3  CL 102 105 104 103  CO2 20* _0 GLUCOSE 155* 94 174* 138*  BUN 37* 32* 30* 31*  CREATININE 2.01* 1.87* 1.65* 1.72*  CALCIUM 8.8* 8.4* 8.8* 9.1   Liver Function Tests: Recent Labs  Lab 01/26/22 1031  AST 25  ALT 42  ALKPHOS 64  BILITOT 0.6  PROT 6.8  ALBUMIN 3.2*   No results for input(s): LIPASE, AMYLASE in the last 168 hours. No results for input(s): AMMONIA in the last 168 hours. CBC: Recent Labs  Lab 01/26/22 1031 01/27/22 0452  WBC 5.1  4.4  NEUTROABS 3.0  --   HGB 13.7 12.5*  HCT 46.9 40.9  MCV 84.4 82.0  PLT 182 158   Cardiac Enzymes: No results for input(s): CKTOTAL, CKMB, CKMBINDEX, TROPONINI in the last 168 hours. BNP: Invalid input(s):  POCBNP CBG: Recent Labs  Lab 01/28/22 1141 01/28/22 1544 01/28/22 2042 01/28/22 2233 01/29/22 0807  GLUCAP 197* 151* 223* 176* 279*   D-Dimer No results for input(s): DDIMER in the last 72 hours. Hgb A1c No results for input(s): HGBA1C in the last 72 hours. Lipid Profile No results for input(s): CHOL, HDL, LDLCALC, TRIG, CHOLHDL, LDLDIRECT in the last 72 hours. Thyroid function studies No results for input(s): TSH, T4TOTAL, T3FREE, THYROIDAB in the last 72 hours.  Invalid input(s): FREET3 Anemia work up No results for input(s): VITAMINB12, FOLATE, FERRITIN, TIBC, IRON, RETICCTPCT in the last 72 hours. Urinalysis    Component Value Date/Time   COLORURINE YELLOW 01/26/2022 1022   APPEARANCEUR CLEAR 01/26/2022 1022   APPEARANCEUR Cloudy 08/01/2014 1614   LABSPEC 1.011 01/26/2022 1022   LABSPEC 1.008 08/01/2014 1614   PHURINE 5.0 01/26/2022 1022   GLUCOSEU >=500 (A) 01/26/2022 1022   GLUCOSEU Negative 08/01/2014 1614   HGBUR NEGATIVE 01/26/2022 1022   BILIRUBINUR NEGATIVE 01/26/2022 1022   BILIRUBINUR Negative 08/01/2014 1614   KETONESUR NEGATIVE 01/26/2022 1022   PROTEINUR NEGATIVE 01/26/2022 1022   UROBILINOGEN 0.2 07/01/2014 1809   NITRITE POSITIVE (A) 01/26/2022 1022   LEUKOCYTESUR MODERATE (A) 01/26/2022 1022   LEUKOCYTESUR Negative 08/01/2014 1614   Sepsis Labs Invalid input(s): PROCALCITONIN,  WBC,  LACTICIDVEN Microbiology Recent Results (from the past 240 hour(s))  Resp Panel by RT-PCR (Flu A&B, Covid)     Status: None   Collection Time: 01/26/22 11:57 AM   Specimen: Nasopharyngeal(NP) swabs in vial transport medium  Result Value Ref Range Status   SARS Coronavirus 2 by RT PCR NEGATIVE NEGATIVE Final    Comment: (NOTE) SARS-CoV-2 target nucleic acids are NOT DETECTED.  The SARS-CoV-2 RNA is generally detectable in upper respiratory specimens during the acute phase of infection. The lowest concentration of SARS-CoV-2 viral copies this assay can detect  is 138 copies/mL. A negative result does not preclude SARS-Cov-2 infection and should not be used as the sole basis for treatment or other patient management decisions. A negative result may occur with  improper specimen collection/handling, submission of specimen other than nasopharyngeal swab, presence of viral mutation(s) within the areas targeted by this assay, and inadequate number of viral copies(<138 copies/mL). A negative result must be combined with clinical observations, patient history, and epidemiological information. The expected result is Negative.  Fact Sheet for Patients:  EntrepreneurPulse.com.au  Fact Sheet for Healthcare Providers:  IncredibleEmployment.be  This test is no t yet approved or cleared by the [place] FDA and  has been authorized for detection and/or diagnosis of SARS-CoV-2 by FDA under an Emergency Use Authorization (EUA). This EUA will remain  in effect (meaning this test can be used) for the duration of the COVID-19 declaration under Section 564(b)(1) of the Act, 21 U.S.C.section 360bbb-3(b)(1), unless the authorization is terminated  or revoked sooner.       Influenza A by PCR NEGATIVE NEGATIVE Final   Influenza B by PCR NEGATIVE NEGATIVE Final    Comment: (NOTE) The Xpert Xpress SARS-CoV-2/FLU/RSV plus assay is intended as an aid in the diagnosis of influenza from Nasopharyngeal swab specimens and should not be used as a sole basis for treatment. Nasal washings and aspirates are unacceptable for Xpert  Xpress SARS-CoV-2/FLU/RSV testing.  Fact Sheet for Patients: EntrepreneurPulse.com.au  Fact Sheet for Healthcare Providers: IncredibleEmployment.be  This test is not yet approved or cleared by the [place] FDA and has been authorized for detection and/or diagnosis of SARS-CoV-2 by FDA under an Emergency Use Authorization (EUA). This EUA will remain in effect  (meaning this test can be used) for the duration of the COVID-19 declaration under Section 564(b)(1) of the Act, 21 U.S.C. section 360bbb-3(b)(1), unless the authorization is terminated or revoked.  Performed at [facility], [place] [address], [place], [place] [address]   Blood culture (routine x 2)     Status: None (Preliminary result)   Collection Time: 01/26/22 12:15 PM   Specimen: Left Antecubital; Blood  Result Value Ref Range Status   Specimen Description LEFT ANTECUBITAL  Final   Special Requests   Final    BOTTLES DRAWN AEROBIC AND ANAEROBIC Blood Culture results may not be optimal due to an inadequate volume of blood received in culture bottles   Culture   Final    NO GROWTH 3 DAYS Performed at [facility], [place] [address] [place], [place] [address]    Report Status PENDING  Incomplete  Blood culture (routine x 2)     Status: None (Preliminary result)   Collection Time: 01/26/22  1:00 PM   Specimen: BLOOD LEFT HAND  Result Value Ref Range Status   Specimen Description BLOOD LEFT HAND  Final   Special Requests   Final    BOTTLES DRAWN AEROBIC ONLY Blood Culture results may not be optimal due to an inadequate volume of blood received in culture bottles   Culture   Final    NO GROWTH 3 DAYS Performed at [facility], [place] [address], [place], [place] [address]    Report Status PENDING  Incomplete      SIGNED:   Charlynne Cousins, MD  [facility] 01/29/2022, 8:33 AM Pager   If 7PM-7AM, please contact night-coverage [email or web address] Password [email or web address]

## 2022-01-29 NOTE — Plan of Care (Signed)
  Problem: Pain Managment: Goal: General experience of comfort will improve Outcome: Progressing   Problem: Safety: Goal: Ability to remain free from injury will improve Outcome: Progressing   Problem: Skin Integrity: Goal: Risk for impaired skin integrity will decrease Outcome: Progressing   

## 2022-01-31 LAB — CULTURE, BLOOD (ROUTINE X 2)
Culture: NO GROWTH
Culture: NO GROWTH

## 2022-02-01 DIAGNOSIS — I5033 Acute on chronic diastolic (congestive) heart failure: Secondary | ICD-10-CM | POA: Diagnosis not present

## 2022-02-01 DIAGNOSIS — L97411 Non-pressure chronic ulcer of right heel and midfoot limited to breakdown of skin: Secondary | ICD-10-CM | POA: Diagnosis not present

## 2022-02-01 DIAGNOSIS — L97819 Non-pressure chronic ulcer of other part of right lower leg with unspecified severity: Secondary | ICD-10-CM | POA: Diagnosis not present

## 2022-02-01 DIAGNOSIS — R262 Difficulty in walking, not elsewhere classified: Secondary | ICD-10-CM | POA: Diagnosis not present

## 2022-02-01 DIAGNOSIS — E1122 Type 2 diabetes mellitus with diabetic chronic kidney disease: Secondary | ICD-10-CM | POA: Diagnosis not present

## 2022-02-01 DIAGNOSIS — L97529 Non-pressure chronic ulcer of other part of left foot with unspecified severity: Secondary | ICD-10-CM | POA: Diagnosis not present

## 2022-02-01 DIAGNOSIS — I2510 Atherosclerotic heart disease of native coronary artery without angina pectoris: Secondary | ICD-10-CM | POA: Diagnosis not present

## 2022-02-01 DIAGNOSIS — F3170 Bipolar disorder, currently in remission, most recent episode unspecified: Secondary | ICD-10-CM | POA: Diagnosis not present

## 2022-02-01 DIAGNOSIS — Z794 Long term (current) use of insulin: Secondary | ICD-10-CM | POA: Diagnosis not present

## 2022-02-01 DIAGNOSIS — I739 Peripheral vascular disease, unspecified: Secondary | ICD-10-CM | POA: Diagnosis not present

## 2022-02-02 ENCOUNTER — Other Ambulatory Visit: Payer: Self-pay

## 2022-02-02 DIAGNOSIS — I70223 Atherosclerosis of native arteries of extremities with rest pain, bilateral legs: Secondary | ICD-10-CM

## 2022-02-08 DIAGNOSIS — L97529 Non-pressure chronic ulcer of other part of left foot with unspecified severity: Secondary | ICD-10-CM | POA: Diagnosis not present

## 2022-02-12 DIAGNOSIS — Z794 Long term (current) use of insulin: Secondary | ICD-10-CM | POA: Diagnosis not present

## 2022-02-12 DIAGNOSIS — E1122 Type 2 diabetes mellitus with diabetic chronic kidney disease: Secondary | ICD-10-CM | POA: Diagnosis not present

## 2022-02-12 DIAGNOSIS — R262 Difficulty in walking, not elsewhere classified: Secondary | ICD-10-CM | POA: Diagnosis not present

## 2022-02-12 DIAGNOSIS — L97529 Non-pressure chronic ulcer of other part of left foot with unspecified severity: Secondary | ICD-10-CM | POA: Diagnosis not present

## 2022-02-12 DIAGNOSIS — I739 Peripheral vascular disease, unspecified: Secondary | ICD-10-CM | POA: Diagnosis not present

## 2022-02-12 DIAGNOSIS — L97819 Non-pressure chronic ulcer of other part of right lower leg with unspecified severity: Secondary | ICD-10-CM | POA: Diagnosis not present

## 2022-02-12 DIAGNOSIS — F3170 Bipolar disorder, currently in remission, most recent episode unspecified: Secondary | ICD-10-CM | POA: Diagnosis not present

## 2022-02-12 DIAGNOSIS — I5033 Acute on chronic diastolic (congestive) heart failure: Secondary | ICD-10-CM | POA: Diagnosis not present

## 2022-02-12 DIAGNOSIS — I2510 Atherosclerotic heart disease of native coronary artery without angina pectoris: Secondary | ICD-10-CM | POA: Diagnosis not present

## 2022-02-18 ENCOUNTER — Encounter (HOSPITAL_COMMUNITY): Payer: Medicare PPO

## 2022-02-18 ENCOUNTER — Ambulatory Visit: Payer: Medicare PPO | Admitting: Vascular Surgery

## 2022-03-11 ENCOUNTER — Observation Stay (HOSPITAL_COMMUNITY)
Admission: EM | Admit: 2022-03-11 | Discharge: 2022-03-19 | Disposition: A | Discharge status: Skilled Nursing Facility | Payer: Medicare HMO | Attending: Emergency Medicine | Admitting: Emergency Medicine

## 2022-03-11 ENCOUNTER — Ambulatory Visit (INDEPENDENT_AMBULATORY_CARE_PROVIDER_SITE_OTHER): Payer: Medicare HMO | Admitting: Vascular Surgery

## 2022-03-11 ENCOUNTER — Emergency Department (HOSPITAL_COMMUNITY): Payer: Medicare HMO

## 2022-03-11 ENCOUNTER — Ambulatory Visit (INDEPENDENT_AMBULATORY_CARE_PROVIDER_SITE_OTHER)
Admission: RE | Admit: 2022-03-11 | Discharge: 2022-03-11 | Disposition: A | Discharge status: Home / Self Care | Payer: Medicare HMO | Source: Ambulatory Visit | Attending: Vascular Surgery | Admitting: Vascular Surgery

## 2022-03-11 ENCOUNTER — Encounter (HOSPITAL_COMMUNITY): Payer: Self-pay

## 2022-03-11 ENCOUNTER — Encounter: Payer: Self-pay | Admitting: Vascular Surgery

## 2022-03-11 VITALS — BP 98/68 | HR 68 | Temp 98.1°F | Resp 20 | Ht 72.0 in | Wt 260.0 lb

## 2022-03-11 DIAGNOSIS — Z7984 Long term (current) use of oral hypoglycemic drugs: Secondary | ICD-10-CM | POA: Insufficient documentation

## 2022-03-11 DIAGNOSIS — M109 Gout, unspecified: Secondary | ICD-10-CM | POA: Diagnosis present

## 2022-03-11 DIAGNOSIS — Z7901 Long term (current) use of anticoagulants: Secondary | ICD-10-CM | POA: Insufficient documentation

## 2022-03-11 DIAGNOSIS — R778 Other specified abnormalities of plasma proteins: Secondary | ICD-10-CM | POA: Diagnosis not present

## 2022-03-11 DIAGNOSIS — F319 Bipolar disorder, unspecified: Secondary | ICD-10-CM

## 2022-03-11 DIAGNOSIS — R5381 Other malaise: Secondary | ICD-10-CM | POA: Diagnosis not present

## 2022-03-11 DIAGNOSIS — Z9181 History of falling: Secondary | ICD-10-CM | POA: Diagnosis not present

## 2022-03-11 DIAGNOSIS — I480 Paroxysmal atrial fibrillation: Secondary | ICD-10-CM | POA: Diagnosis not present

## 2022-03-11 DIAGNOSIS — R600 Localized edema: Secondary | ICD-10-CM | POA: Diagnosis present

## 2022-03-11 DIAGNOSIS — I70299 Other atherosclerosis of native arteries of extremities, unspecified extremity: Secondary | ICD-10-CM

## 2022-03-11 DIAGNOSIS — R0902 Hypoxemia: Secondary | ICD-10-CM | POA: Diagnosis not present

## 2022-03-11 DIAGNOSIS — G9341 Metabolic encephalopathy: Secondary | ICD-10-CM | POA: Diagnosis not present

## 2022-03-11 DIAGNOSIS — R296 Repeated falls: Secondary | ICD-10-CM | POA: Diagnosis not present

## 2022-03-11 DIAGNOSIS — G4733 Obstructive sleep apnea (adult) (pediatric): Secondary | ICD-10-CM

## 2022-03-11 DIAGNOSIS — I4892 Unspecified atrial flutter: Secondary | ICD-10-CM | POA: Diagnosis present

## 2022-03-11 DIAGNOSIS — E11649 Type 2 diabetes mellitus with hypoglycemia without coma: Secondary | ICD-10-CM | POA: Diagnosis not present

## 2022-03-11 DIAGNOSIS — I70223 Atherosclerosis of native arteries of extremities with rest pain, bilateral legs: Secondary | ICD-10-CM | POA: Insufficient documentation

## 2022-03-11 DIAGNOSIS — Z79899 Other long term (current) drug therapy: Secondary | ICD-10-CM | POA: Insufficient documentation

## 2022-03-11 DIAGNOSIS — N1831 Chronic kidney disease, stage 3a: Secondary | ICD-10-CM

## 2022-03-11 DIAGNOSIS — N179 Acute kidney failure, unspecified: Secondary | ICD-10-CM | POA: Insufficient documentation

## 2022-03-11 DIAGNOSIS — Z86711 Personal history of pulmonary embolism: Secondary | ICD-10-CM | POA: Diagnosis not present

## 2022-03-11 DIAGNOSIS — R4182 Altered mental status, unspecified: Secondary | ICD-10-CM | POA: Diagnosis not present

## 2022-03-11 DIAGNOSIS — L97909 Non-pressure chronic ulcer of unspecified part of unspecified lower leg with unspecified severity: Secondary | ICD-10-CM

## 2022-03-11 DIAGNOSIS — Z86718 Personal history of other venous thrombosis and embolism: Secondary | ICD-10-CM | POA: Diagnosis not present

## 2022-03-11 DIAGNOSIS — N1832 Chronic kidney disease, stage 3b: Secondary | ICD-10-CM | POA: Diagnosis not present

## 2022-03-11 DIAGNOSIS — Z794 Long term (current) use of insulin: Secondary | ICD-10-CM | POA: Diagnosis not present

## 2022-03-11 DIAGNOSIS — I5032 Chronic diastolic (congestive) heart failure: Secondary | ICD-10-CM | POA: Diagnosis not present

## 2022-03-11 DIAGNOSIS — E162 Hypoglycemia, unspecified: Secondary | ICD-10-CM | POA: Diagnosis present

## 2022-03-11 DIAGNOSIS — R609 Edema, unspecified: Secondary | ICD-10-CM | POA: Diagnosis not present

## 2022-03-11 DIAGNOSIS — Z7982 Long term (current) use of aspirin: Secondary | ICD-10-CM | POA: Diagnosis not present

## 2022-03-11 DIAGNOSIS — E039 Hypothyroidism, unspecified: Secondary | ICD-10-CM | POA: Insufficient documentation

## 2022-03-11 DIAGNOSIS — I130 Hypertensive heart and chronic kidney disease with heart failure and stage 1 through stage 4 chronic kidney disease, or unspecified chronic kidney disease: Secondary | ICD-10-CM | POA: Diagnosis not present

## 2022-03-11 DIAGNOSIS — I959 Hypotension, unspecified: Secondary | ICD-10-CM | POA: Diagnosis not present

## 2022-03-11 DIAGNOSIS — I10 Essential (primary) hypertension: Secondary | ICD-10-CM | POA: Diagnosis not present

## 2022-03-11 DIAGNOSIS — R000 Tachycardia, unspecified: Secondary | ICD-10-CM | POA: Diagnosis not present

## 2022-03-11 LAB — COMPREHENSIVE METABOLIC PANEL
ALT: 34 U/L (ref 0–44)
AST: 29 U/L (ref 15–41)
Albumin: 3.4 g/dL — ABNORMAL LOW (ref 3.5–5.0)
Alkaline Phosphatase: 62 U/L (ref 38–126)
Anion gap: 8 (ref 5–15)
BUN: 52 mg/dL — ABNORMAL HIGH (ref 8–23)
CO2: 28 mmol/L (ref 22–32)
Calcium: 9 mg/dL (ref 8.9–10.3)
Chloride: 98 mmol/L (ref 98–111)
Creatinine, Ser: 2.27 mg/dL — ABNORMAL HIGH (ref 0.61–1.24)
GFR, Estimated: 31 mL/min — ABNORMAL LOW (ref >60)
Glucose, Bld: 144 mg/dL — ABNORMAL HIGH (ref 70–99)
Potassium: 4.6 mmol/L (ref 3.5–5.1)
Sodium: 134 mmol/L — ABNORMAL LOW (ref 135–145)
Total Bilirubin: 0.4 mg/dL (ref 0.3–1.2)
Total Protein: 7.5 g/dL (ref 6.5–8.1)

## 2022-03-11 LAB — URINALYSIS, ROUTINE W REFLEX MICROSCOPIC
Bacteria, UA: NONE SEEN
Bilirubin Urine: NEGATIVE
Glucose, UA: >=500 mg/dL — AB
Hgb urine dipstick: NEGATIVE
Ketones, ur: NEGATIVE mg/dL
Nitrite: NEGATIVE
Protein, ur: NEGATIVE mg/dL
Specific Gravity, Urine: 1.007 (ref 1.005–1.030)
pH: 5 (ref 5.0–8.0)

## 2022-03-11 LAB — TROPONIN I (HIGH SENSITIVITY): Troponin I (High Sensitivity): 28 ng/L — ABNORMAL HIGH (ref <18)

## 2022-03-11 LAB — LACTIC ACID, PLASMA: Lactic Acid, Venous: 1.7 mmol/L (ref 0.5–1.9)

## 2022-03-11 LAB — CBC
HCT: 45.7 % (ref 39.0–52.0)
Hemoglobin: 13.6 g/dL (ref 13.0–17.0)
MCH: 25.4 pg — ABNORMAL LOW (ref 26.0–34.0)
MCHC: 29.8 g/dL — ABNORMAL LOW (ref 30.0–36.0)
MCV: 85.4 fL (ref 80.0–100.0)
Platelets: 166 10*3/uL (ref 150–400)
RBC: 5.35 MIL/uL (ref 4.22–5.81)
RDW: 17.7 % — ABNORMAL HIGH (ref 11.5–15.5)
WBC: 3.6 10*3/uL — ABNORMAL LOW (ref 4.0–10.5)
nRBC: 0 % (ref 0.0–0.2)

## 2022-03-11 LAB — AMMONIA: Ammonia: 13 umol/L (ref 9–35)

## 2022-03-11 LAB — CBG MONITORING, ED
Glucose-Capillary: 39 mg/dL — CL (ref 70–99)
Glucose-Capillary: 48 mg/dL — ABNORMAL LOW (ref 70–99)
Glucose-Capillary: 164 mg/dL — ABNORMAL HIGH (ref 70–99)

## 2022-03-11 MED ORDER — DEXTROSE 50 % IV SOLN: 50.0000 mL | Freq: Once | INTRAVENOUS | Status: AC

## 2022-03-11 MED ORDER — DEXTROSE 50 % IV SOLN
INTRAVENOUS | Status: AC
  Administered 2022-03-11: 50 mL via INTRAVENOUS
  Filled 2022-03-11: qty 50

## 2022-03-11 NOTE — Progress Notes (Signed)
Consulted for I.V. start. Pt in dark hallway in recliner. At this time only able to get a 24g in hand. Nothing ordered I.V. at this time. ?

## 2022-03-11 NOTE — ED Triage Notes (Addendum)
Pt presents from an MD office with c/o bilateral leg edema. Pt reports was at the MD office for counseling and they called EMS because he appeared lethargic and was hypotensive and hypoxic. Upon EMS arrival, SPO2 was 93% on RA, HR in the 70's and BP was 142/70. Pt does appear sleepy in triage but will open his eyes and ope questions. Pt reports he is here for his leg swelling. ?

## 2022-03-11 NOTE — ED Provider Triage Note (Signed)
Emergency Medicine Provider Triage Evaluation Note ? ?Christian [name] , a 71 y.o. male  was evaluated in triage.  Pt sent by MD office for hypotension and hypoxia.  Was being evaluated for chronic bilateral leg edema, with weeping ulcers of the left leg.  Denies chest pain or shortness of breath.  Hx of previous MI with multiple stents placed, and Hx of TIA.  Denies nausea or vomiting. ? ?Review of Systems  ?Positive: As above ?Negative: As above ? ?Physical Exam  ?BP 133/80 (BP Location: Left Arm)   Pulse 78   Temp 97.7 ?F (36.5 ?C) (Oral)   Resp 14   SpO2 99%  ?Gen:   Awake, no distress, mildly groggy ?Resp:  Normal effort, CTAB ?MSK:   Moves extremities without difficulty ?Other:  RRR w/o M/R/G; significant lower leg edema; bandage present on left shin; radial pulse 2+; sensation intact in all extremities.  ? ?Medical Decision Making  ?Medically screening exam initiated at 7:04 PM.  Appropriate orders placed.  Christian Scale Hynes was informed that the remainder of the evaluation will be completed by another provider, this initial triage assessment does not replace that evaluation, and the importance of remaining in the ED until their evaluation is complete. ? ?Labs, imaging, EKG ?  ?Prince Rome, PA-C ?[id number] ? ?

## 2022-03-11 NOTE — ED Notes (Signed)
Patient given orange juice and sandwich and encouraged to eat. ?

## 2022-03-11 NOTE — Progress Notes (Signed)
? ? ?REASON FOR VISIT:  ? ?Follow-up of the leg wounds. ? ?MEDICAL ISSUES:  ? ?PERIPHERAL ARTERIAL DISEASE: This patient has developed a new blister on his left leg which burst and now he has a superficial wound on the left leg.  He has severe tibial artery occlusive disease bilaterally.  I encouraged him to elevate his leg to get his swelling under control.  Surprisingly his venous duplex scan shows no evidence of significant venous disease.  Thus I think all of his swelling is related to lymphedema.  If this wound does not improve Dr. Virl Cagey was considering a retrograde tibial access on the left which would be associated with significant risk.  He would be a very poor risk for surgery given his debilitated state.  Currently has no significant wounds on the right side.  On the right side likewise he has severe tibial artery occlusive disease.  We will bring him back in approximately 6 weeks for a wound check.  If the wound has not improved he would have to be considered for a retrograde tibial intervention on the left is the only real option for limb salvage.  Certainly would be helpful to treat his lymphedema if we could address his arterial disease but the situation is obviously very complicated.  For now we will focus on elevation to get the swelling under control and moderate compression. ? ? ?HPI:  ? ?Christian [name] is a pleasant 71 y.o. male who was most recently seen in the office by Dr. Virl Cagey on 11/06/2021.  He had presented with a wound on his left foot and underwent an arteriogram on 09/16/2021.  This showed severe tibial artery occlusive disease bilaterally.  On the left side where he had the wound he did not have inline flow to the foot.  He reconstituted a posterior tibial artery.  If the wound did not heal he would require a high risk retrograde posterior tibial access intervention.  The wound fortunately healed.  He did have a blister on his anterior left leg and this broke yesterday leaving the  wound on his left leg. ? ?He has significant bilateral lower extremity swelling and apparently a formal venous study was arranged for today.  The results of this are below.  He denies any rest pain.  He states that he is able to elevate his legs without pain in his feet.  His activity is very limited he is quite debilitated. ? ?Past Medical History:  ?Diagnosis Date  ? Bilateral lower extremity edema: chronic with venous stasis changes 06/02/2014  ? Bipolar 1 disorder ([place])   ? Bipolar disorder ([place])   ? CHF (congestive heart failure) ([place])   ? Chronic kidney disease   ? Gout   ? Gout   ? Hx of blood clots   ? Hypertension   ? Mood swings   ? OSA on CPAP 06/02/2014  ? Other and unspecified hyperlipidemia 06/02/2014  ? Type II or unspecified type diabetes mellitus with unspecified complication, uncontrolled 06/02/2014  ? ? ?Family History  ?Problem Relation Age of Onset  ? Dementia Mother   ? Arthritis Father   ? Hypertension Father   ? Mental illness Sister   ? Diabetes Sister   ? Heart failure Neg Hx   ? Kidney failure Neg Hx   ? Cancer Neg Hx   ? ? ?SOCIAL HISTORY: ?Social History  ? ?Tobacco Use  ? Smoking status: Never  ? Smokeless tobacco: Never  ?Substance Use Topics  ?  Alcohol use: No  ? ? ?Allergies  ?Allergen Reactions  ? Amlodipine Swelling and Other (See Comments)  ?  Legs became swollen  ? Onglyza [Saxagliptin] Other (See Comments)  ?  "Allergic," per [facility]  ? Zyprexa [Olanzapine] Other (See Comments)  ?  Allergy noted by ACT Team. No reaction specified (??)- "Allergic," per [facility]  ? Ritalin [Methylphenidate] Anxiety  ?  "Maniac"  ? ? ?Current Outpatient Medications  ?Medication Sig Dispense Refill  ? acetaminophen (TYLENOL) 500 MG tablet Take 500-1,000 mg by mouth every 6 (six) hours as needed (pain.).    ? ARIPiprazole (ABILIFY) 20 MG tablet Take 1 tablet (20 mg total) by mouth daily. 30 tablet 0  ? aspirin EC 81 MG tablet Take 81 mg by mouth daily. Swallow whole.    ? atorvastatin  (LIPITOR) 10 MG tablet Take 1 tablet (10 mg total) by mouth daily at 6 PM. (Patient taking differently: Take 10 mg by mouth daily.) 30 tablet 0  ? buPROPion (WELLBUTRIN) 75 MG tablet Take 1 tablet (75 mg total) by mouth daily. 30 tablet 0  ? divalproex (DEPAKOTE ER) 500 MG 24 hr tablet Take 500 mg by mouth in the morning and at bedtime.    ? empagliflozin (JARDIANCE) 10 MG TABS tablet Take 10 mg by mouth daily.     ? furosemide (LASIX) 40 MG tablet Take Lasix 40 mg twice a day for 3 days then go back to Lasix 40 mg daily 30 tablet 0  ? glimepiride (AMARYL) 2 MG tablet Take 2 mg by mouth daily.    ? insulin glargine (LANTUS) 100 UNIT/ML injection Inject 10 Units into the skin daily.    ? liothyronine (CYTOMEL) 5 MCG tablet Take 10 mcg by mouth daily.    ? mirtazapine (REMERON) 15 MG tablet Take 15 mg by mouth at bedtime.    ? mupirocin ointment (BACTROBAN) 2 % APPLY 1 APPLICATION TOPICALLY DAILY. APPLIED TO TOE WOUND (Patient taking differently: Apply 1 application. topically See admin instructions. 1 application daily to toe wound) 22 g 0  ? polyethylene glycol powder (GLYCOLAX/MIRALAX) 17 GM/SCOOP powder Take 17 g by mouth daily. (Patient taking differently: Take 17 g by mouth daily as needed for mild constipation.) 255 g 0  ? QUEtiapine (SEROQUEL) 50 MG tablet Take 50 mg by mouth at bedtime.    ? rivaroxaban (XARELTO) 20 MG TABS tablet Take 1 tablet (20 mg total) by mouth daily with supper. (Patient taking differently: Take 20 mg by mouth at bedtime.) 30 tablet 0  ? metoprolol succinate (TOPROL-XL) 50 MG 24 hr tablet Take 1 tablet (50 mg total) by mouth daily. Take with or immediately following a meal. 30 tablet 0  ? ?No current facility-administered medications for this visit.  ? ? ?REVIEW OF SYSTEMS:  ?[X]  denotes positive finding, [ ]  denotes negative finding ?Cardiac  Comments:  ?Chest pain or chest pressure:    ?Shortness of breath upon exertion: x   ?Short of breath when lying flat:    ?Irregular heart  rhythm:    ?    ?Vascular    ?Pain in calf, thigh, or hip brought on by ambulation:    ?Pain in feet at night that wakes you up from your sleep:     ?Blood clot in your veins:    ?Leg swelling:  x   ?    ?Pulmonary    ?Oxygen at home:    ?Productive cough:     ?Wheezing:     ?    ?  Neurologic    ?Sudden weakness in arms or legs:     ?Sudden numbness in arms or legs:     ?Sudden onset of difficulty speaking or slurred speech:    ?Temporary loss of vision in one eye:     ?Problems with dizziness:  x   ?    ?Gastrointestinal    ?Blood in stool:     ?Vomited blood:     ?    ?Genitourinary    ?Burning when urinating:     ?Blood in urine:    ?    ?Psychiatric    ?Major depression:     ?    ?Hematologic    ?Bleeding problems:    ?Problems with blood clotting too easily:    ?    ?Skin    ?Rashes or ulcers:    ?    ?Constitutional    ?Fever or chills:    ? ?PHYSICAL EXAM:  ? ?Vitals:  ? 03/11/22 0844  ?BP: 98/68  ?Pulse: 68  ?Resp: 20  ?Temp: 98.1 ?F (36.7 ?C)  ?SpO2: 97%  ?Weight: 260 lb (117.9 kg)  ?Height: 6' (1.829 m)  ? ? ?GENERAL: The patient is a well-nourished male, in no acute distress. The vital signs are documented above. ?CARDIAC: There is a regular rate and rhythm.  ?VASCULAR: I do not detect carotid bruits. ?He has palpable femoral pulses. ?Because of the massive swelling in his feet and thickened skin with lipodermatosclerosis, I was only able to get a posterior tibial signal on the right. ? ? ? ? ?PULMONARY: There is good air exchange bilaterally without wheezing or rales. ?ABDOMEN: Soft and non-tender with normal pitched bowel sounds.  ?MUSCULOSKELETAL: There are no major deformities or cyanosis. ?NEUROLOGIC: No focal weakness or paresthesias are detected. ?SKIN: There are no ulcers or rashes noted. ?PSYCHIATRIC: The patient has a normal affect. ? ?DATA:   ? ?VENOUS DUPLEX STUDY: I have independently interpreted his venous duplex scan today. ? ?On the right side, there was no evidence of DVT.  There was no  deep venous reflux and no superficial venous reflux. ? ?On the left side there was no evidence of DVT.  There was no deep venous reflux and no superficial venous reflux. ? ?ARTERIAL DOPPLER STUDY: His most re

## 2022-03-12 ENCOUNTER — Other Ambulatory Visit: Payer: Self-pay

## 2022-03-12 DIAGNOSIS — E1122 Type 2 diabetes mellitus with diabetic chronic kidney disease: Secondary | ICD-10-CM | POA: Diagnosis not present

## 2022-03-12 DIAGNOSIS — E162 Hypoglycemia, unspecified: Secondary | ICD-10-CM | POA: Diagnosis present

## 2022-03-12 DIAGNOSIS — I10 Essential (primary) hypertension: Secondary | ICD-10-CM | POA: Diagnosis not present

## 2022-03-12 DIAGNOSIS — E782 Mixed hyperlipidemia: Secondary | ICD-10-CM | POA: Diagnosis not present

## 2022-03-12 DIAGNOSIS — F3131 Bipolar disorder, current episode depressed, mild: Secondary | ICD-10-CM | POA: Diagnosis not present

## 2022-03-12 DIAGNOSIS — I110 Hypertensive heart disease with heart failure: Secondary | ICD-10-CM | POA: Diagnosis not present

## 2022-03-12 LAB — GLUCOSE, CAPILLARY
Glucose-Capillary: 116 mg/dL — ABNORMAL HIGH (ref 70–99)
Glucose-Capillary: 54 mg/dL — ABNORMAL LOW (ref 70–99)
Glucose-Capillary: 142 mg/dL — ABNORMAL HIGH (ref 70–99)
Glucose-Capillary: 112 mg/dL — ABNORMAL HIGH (ref 70–99)
Glucose-Capillary: 153 mg/dL — ABNORMAL HIGH (ref 70–99)
Glucose-Capillary: 153 mg/dL — ABNORMAL HIGH (ref 70–99)
Glucose-Capillary: 117 mg/dL — ABNORMAL HIGH (ref 70–99)

## 2022-03-12 LAB — CBC
HCT: 40.3 % (ref 39.0–52.0)
HCT: 42.9 % (ref 39.0–52.0)
Hemoglobin: 12.5 g/dL — ABNORMAL LOW (ref 13.0–17.0)
Hemoglobin: 12.7 g/dL — ABNORMAL LOW (ref 13.0–17.0)
MCH: 26.3 pg (ref 26.0–34.0)
MCH: 25.8 pg — ABNORMAL LOW (ref 26.0–34.0)
MCHC: 31 g/dL (ref 30.0–36.0)
MCHC: 29.6 g/dL — ABNORMAL LOW (ref 30.0–36.0)
MCV: 84.7 fL (ref 80.0–100.0)
MCV: 87 fL (ref 80.0–100.0)
Platelets: 149 10*3/uL — ABNORMAL LOW (ref 150–400)
Platelets: 125 10*3/uL — ABNORMAL LOW (ref 150–400)
RBC: 4.76 MIL/uL (ref 4.22–5.81)
RBC: 4.93 MIL/uL (ref 4.22–5.81)
RDW: 17.6 % — ABNORMAL HIGH (ref 11.5–15.5)
RDW: 17.7 % — ABNORMAL HIGH (ref 11.5–15.5)
WBC: 3.4 10*3/uL — ABNORMAL LOW (ref 4.0–10.5)
WBC: 3.4 10*3/uL — ABNORMAL LOW (ref 4.0–10.5)
nRBC: 0 % (ref 0.0–0.2)
nRBC: 0 % (ref 0.0–0.2)

## 2022-03-12 LAB — BLOOD GAS, ARTERIAL
Acid-Base Excess: 3.3 mmol/L — ABNORMAL HIGH (ref 0.0–2.0)
Bicarbonate: 29.1 mmol/L — ABNORMAL HIGH (ref 20.0–28.0)
Drawn by: 56037
O2 Saturation: 90.2 %
Patient temperature: 36.4
pCO2 arterial: 47 mmHg (ref 32–48)
pH, Arterial: 7.4 (ref 7.35–7.45)
pO2, Arterial: 59 mmHg — ABNORMAL LOW (ref 83–108)

## 2022-03-12 LAB — PHOSPHORUS: Phosphorus: 3.6 mg/dL (ref 2.5–4.6)

## 2022-03-12 LAB — BASIC METABOLIC PANEL
Anion gap: 10 (ref 5–15)
BUN: 52 mg/dL — ABNORMAL HIGH (ref 8–23)
CO2: 24 mmol/L (ref 22–32)
Calcium: 8.9 mg/dL (ref 8.9–10.3)
Chloride: 104 mmol/L (ref 98–111)
Creatinine, Ser: 2.08 mg/dL — ABNORMAL HIGH (ref 0.61–1.24)
GFR, Estimated: 34 mL/min — ABNORMAL LOW (ref >60)
Glucose, Bld: 92 mg/dL (ref 70–99)
Potassium: 4.6 mmol/L (ref 3.5–5.1)
Sodium: 138 mmol/L (ref 135–145)

## 2022-03-12 LAB — TROPONIN I (HIGH SENSITIVITY)
Troponin I (High Sensitivity): 29 ng/L — ABNORMAL HIGH (ref <18)
Troponin I (High Sensitivity): 27 ng/L — ABNORMAL HIGH (ref <18)
Troponin I (High Sensitivity): 31 ng/L — ABNORMAL HIGH (ref <18)

## 2022-03-12 LAB — CBG MONITORING, ED
Glucose-Capillary: 128 mg/dL — ABNORMAL HIGH (ref 70–99)
Glucose-Capillary: 133 mg/dL — ABNORMAL HIGH (ref 70–99)
Glucose-Capillary: 142 mg/dL — ABNORMAL HIGH (ref 70–99)

## 2022-03-12 LAB — MAGNESIUM: Magnesium: 2.2 mg/dL (ref 1.7–2.4)

## 2022-03-12 MED ORDER — MIRTAZAPINE 15 MG PO TABS
15.0000 mg | ORAL_TABLET | Freq: Every day | ORAL | Status: DC
  Administered 2022-03-12 – 2022-03-18 (×7): 15 mg via ORAL
  Filled 2022-03-12 (×7): qty 1

## 2022-03-12 MED ORDER — DEXTROSE 10 % IV SOLN: INTRAVENOUS | Status: DC

## 2022-03-12 MED ORDER — DEXTROSE 50 % IV SOLN
25.0000 g | INTRAVENOUS | Status: AC
  Administered 2022-03-12: 25 g via INTRAVENOUS
  Filled 2022-03-12: qty 50

## 2022-03-12 MED ORDER — BUPROPION HCL 75 MG PO TABS
75.0000 mg | ORAL_TABLET | Freq: Every day | ORAL | Status: DC
  Administered 2022-03-12 – 2022-03-19 (×8): 75 mg via ORAL
  Filled 2022-03-12 (×8): qty 1

## 2022-03-12 MED ORDER — ASPIRIN EC 81 MG PO TBEC
81.0000 mg | DELAYED_RELEASE_TABLET | Freq: Every day | ORAL | Status: DC
  Administered 2022-03-12 – 2022-03-19 (×8): 81 mg via ORAL
  Filled 2022-03-12 (×8): qty 1

## 2022-03-12 MED ORDER — ENOXAPARIN SODIUM 30 MG/0.3ML IJ SOSY: 30.0000 mg | PREFILLED_SYRINGE | INTRAMUSCULAR | Status: DC

## 2022-03-12 MED ORDER — ARIPIPRAZOLE 10 MG PO TABS
20.0000 mg | ORAL_TABLET | Freq: Every day | ORAL | Status: DC
  Administered 2022-03-12 – 2022-03-19 (×8): 20 mg via ORAL
  Filled 2022-03-12 (×8): qty 2

## 2022-03-12 MED ORDER — RIVAROXABAN 20 MG PO TABS
20.0000 mg | ORAL_TABLET | Freq: Every day | ORAL | Status: DC
Start: 2022-03-12 — End: 2022-03-19
  Administered 2022-03-12 – 2022-03-18 (×7): 20 mg via ORAL
  Filled 2022-03-12 (×7): qty 1

## 2022-03-12 MED ORDER — SODIUM CHLORIDE 0.9 % IV BOLUS
500.0000 mL | Freq: Once | INTRAVENOUS | Status: AC
  Administered 2022-03-12: 500 mL via INTRAVENOUS

## 2022-03-12 MED ORDER — LIOTHYRONINE SODIUM 5 MCG PO TABS
10.0000 ug | ORAL_TABLET | Freq: Every day | ORAL | Status: DC
  Administered 2022-03-12 – 2022-03-19 (×8): 10 ug via ORAL
  Filled 2022-03-12 (×8): qty 2

## 2022-03-12 MED ORDER — DIVALPROEX SODIUM ER 500 MG PO TB24
500.0000 mg | ORAL_TABLET | Freq: Every day | ORAL | Status: DC
  Administered 2022-03-12 – 2022-03-19 (×8): 500 mg via ORAL
  Filled 2022-03-12 (×8): qty 1

## 2022-03-12 MED ORDER — ATORVASTATIN CALCIUM 10 MG PO TABS
10.0000 mg | ORAL_TABLET | Freq: Every day | ORAL | Status: DC
  Administered 2022-03-12 – 2022-03-18 (×7): 10 mg via ORAL
  Filled 2022-03-12 (×7): qty 1

## 2022-03-12 MED ORDER — QUETIAPINE FUMARATE 25 MG PO TABS
25.0000 mg | ORAL_TABLET | Freq: Every day | ORAL | Status: DC
  Administered 2022-03-12: 25 mg via ORAL
  Filled 2022-03-12: qty 1

## 2022-03-12 NOTE — Progress Notes (Signed)
PT Cancellation Note ? ?Patient Details ?Name: Christian [name] ?MRN: [id number] ?DOB: [birth year] ? ? ?Cancelled Treatment:    Reason Eval/Treat Not Completed: Fatigue/lethargy limiting ability to participate (per RN, pt is very lethargic and unable to participate in PT at present. Will follow.) ? ?[name] PT 03/12/2022  ?Acute Rehabilitation Services ? ?Office [phone] ? ?

## 2022-03-12 NOTE — H&P (Addendum)
?History and Physical ? ?Christian [name] HFS:[id number] DOB: 1951/01/15 DOA: 03/11/2022 ? ?Referring physician: Ala Dach, PA-EDP  ?PCP: [name], MD  ?Outpatient Specialists: Vascular surgery ?Patient coming from: Home through vascular surgery's office ? ?Chief Complaint: Altered mental status ? ?HPI: Christian [name] is a 71 y.o. male with medical history significant for peripheral artery disease followed by vascular surgery, bilateral lower extremity lymphedema, bipolar disorder, chronic diastolic CHF, gout, hypertension, OSA on CPAP, type 2 diabetes, was sent to [facility] ED from his vascular surgeon's office due to lethargy.  Upon presentation to the ED patient is found to be severely hypoglycemic with blood glucose of 39.  He was given D50 and was started on dextrose infusion.  Blood work revealed AKI and slightly elevated troponin 28.  Upon assessment the patient is lethargic and responds to painful stimuli when touching his legs and goes back to sleep.  No family members at bedside.  TRH was asked to admit for further evaluation and management. ? ?ED Course: Tmax 98.1.  BP 114/78, pulse 81, respiratory rate 20, saturation 99% on room air. ? ?Review of Systems: ?Review of systems as noted in the HPI. All other systems reviewed and are negative. ? ? ?Past Medical History:  ?Diagnosis Date  ? Bilateral lower extremity edema: chronic with venous stasis changes 06/02/2014  ? Bipolar 1 disorder ([facility])   ? Bipolar disorder ([facility])   ? CHF (congestive heart failure) ([facility])   ? Chronic kidney disease   ? Gout   ? Gout   ? Hx of blood clots   ? Hypertension   ? Mood swings   ? OSA on CPAP 06/02/2014  ? Other and unspecified hyperlipidemia 06/02/2014  ? Type II or unspecified type diabetes mellitus with unspecified complication, uncontrolled 06/02/2014  ? ?Past Surgical History:  ?Procedure Laterality Date  ? ABDOMINAL AORTOGRAM W/LOWER EXTREMITY N/A 09/16/2021  ? Procedure: ABDOMINAL AORTOGRAM W/LOWER EXTREMITY;  Surgeon: Victorino Sparrow, MD;  Location: [facility] INVASIVE CV LAB;  Service: Cardiovascular;  Laterality: N/A;  ? LEFT HEART CATH AND CORONARY ANGIOGRAPHY Left 04/01/2020  ? Procedure: LEFT HEART CATH AND CORONARY ANGIOGRAPHY;  Surgeon: Laurier Nancy, MD;  Location: [facility] INVASIVE CV LAB;  Service: Cardiovascular;  Laterality: Left;  ? PERIPHERAL VASCULAR BALLOON ANGIOPLASTY Left 09/16/2021  ? Procedure: PERIPHERAL VASCULAR BALLOON ANGIOPLASTY;  Surgeon: Victorino Sparrow, MD;  Location: [facility] INVASIVE CV LAB;  Service: Cardiovascular;  Laterality: Left;  attempted to PTA posterior tibial artery; unable to cross lesion  ? ? ?Social History:  reports that he has never smoked. He has never used smokeless tobacco. He reports that he does not drink alcohol and does not use drugs. ? ? ?Allergies  ?Allergen Reactions  ? Amlodipine Swelling and Other (See Comments)  ?  Legs became swollen  ? Onglyza [Saxagliptin] Other (See Comments)  ?  "Allergic," per [facility]  ? Zyprexa [Olanzapine] Other (See Comments)  ?  Allergy noted by ACT Team. No reaction specified (??)- "Allergic," per [facility]  ? Ritalin [Methylphenidate] Anxiety  ?  "Maniac"  ? ? ?Family History  ?Problem Relation Age of Onset  ? Dementia Mother   ? Arthritis Father   ? Hypertension Father   ? Mental illness Sister   ? Diabetes Sister   ? Heart failure Neg Hx   ? Kidney failure Neg Hx   ? Cancer Neg Hx   ?  ? ? ?Prior to Admission medications   ?Medication Sig Start Date  End Date Taking? Authorizing Provider  ?acetaminophen (TYLENOL) 500 MG tablet Take 500-1,000 mg by mouth every 6 (six) hours as needed (pain.).   Yes [provider]  ?ARIPiprazole (ABILIFY) 20 MG tablet Take 1 tablet (20 mg total) by mouth daily. 02/06/20  Yes Starkes-Perry, Gayland Curry, FNP  ?aspirin EC 81 MG tablet Take 81 mg by mouth daily. Swallow whole.   Yes [provider]  ?atorvastatin (LIPITOR) 10 MG tablet Take 1 tablet (10 mg total) by mouth daily at 6 PM. ?Patient taking  differently: Take 10 mg by mouth every evening. 02/05/20  Yes Starkes-Perry, Gayland Curry, FNP  ?buPROPion (WELLBUTRIN) 75 MG tablet Take 1 tablet (75 mg total) by mouth daily. 02/06/20  Yes Starkes-Perry, Gayland Curry, FNP  ?divalproex (DEPAKOTE ER) 500 MG 24 hr tablet Take 500 mg by mouth in the morning and at bedtime.   Yes [provider]  ?empagliflozin (JARDIANCE) 10 MG TABS tablet Take 10 mg by mouth daily.  06/11/20  Yes [provider]  ?glimepiride (AMARYL) 2 MG tablet Take 2 mg by mouth daily.   Yes [provider]  ?insulin glargine (LANTUS) 100 UNIT/ML injection Inject 10 Units into the skin daily.   Yes [provider]  ?liothyronine (CYTOMEL) 5 MCG tablet Take 10 mcg by mouth daily.   Yes [provider]  ?metoprolol succinate (TOPROL-XL) 50 MG 24 hr tablet Take 1 tablet (50 mg total) by mouth daily. Take with or immediately following a meal. 01/28/22 03/13/23 Yes Charlynne Cousins, MD  ?polyethylene glycol powder (GLYCOLAX/MIRALAX) 17 GM/SCOOP powder Take 17 g by mouth daily. ?Patient taking differently: Take 17 g by mouth daily as needed for mild constipation. 10/14/20  Yes Ghimire, [name], MD  ?QUEtiapine (SEROQUEL) 50 MG tablet Take 50 mg by mouth at bedtime.   Yes [provider]  ?rivaroxaban (XARELTO) 20 MG TABS tablet Take 1 tablet (20 mg total) by mouth daily with supper. 04/02/20  Yes Dhungel, Nishant, MD  ?torsemide (DEMADEX) 20 MG tablet Take 20 mg by mouth 2 (two) times daily. 01/30/22  Yes [provider]  ?mirtazapine (REMERON) 15 MG tablet Take 15 mg by mouth at bedtime. 05/01/21   [provider]  ? ? ?Physical Exam: ?BP 114/78 (BP Location: Right Arm)   Pulse 81   Temp 97.6 ?F (36.4 ?C) (Oral)   Resp 20   Ht 6' (1.829 m)   Wt 117.9 kg   SpO2 99%   BMI 35.25 kg/m?  ? ?General: 71 y.o. year-old male well developed well nourished in no acute distress.  Lethargic.   ?Cardiovascular: Regular rate and rhythm with no rubs or  gallops.  No thyromegaly or JVD noted.  Bilateral lower extremity lymphedema.  Right lower extremity draining wound. ?Respiratory: Clear to auscultation with no wheezes or rales. Good inspiratory effort. ?Abdomen: Soft nontender nondistended with normal bowel sounds x4 quadrants. ?Muskuloskeletal: No cyanosis or clubbing.  Bilateral lower extremity lymphedema. ?Neuro: CN II-XII intact, strength, sensation, reflexes ?Skin: Right lower extremity wound, auto draining. ?Psychiatry: Unable to assess judgment or mood due to lethargy. ?   ?   ?   ?Labs on Admission:  ?Basic Metabolic Panel: ?Recent Labs  ?Lab 03/11/22 ?2247  ?NA 134*  ?K 4.6  ?CL 98  ?CO2 28  ?GLUCOSE 144*  ?BUN 52*  ?CREATININE 2.27*  ?CALCIUM 9.0  ? ?Liver Function Tests: ?Recent Labs  ?Lab 03/11/22 ?2247  ?AST 29  ?ALT 34  ?ALKPHOS 62  ?BILITOT 0.4  ?  PROT 7.5  ?ALBUMIN 3.4*  ? ?No results for input(s): LIPASE, AMYLASE in the last 168 hours. ?Recent Labs  ?Lab 03/11/22 ?2247  ?AMMONIA 13  ? ?CBC: ?Recent Labs  ?Lab 03/11/22 ?2247 03/12/22 ?0223  ?WBC 3.6* 3.4*  ?HGB 13.6 12.5*  ?HCT 45.7 40.3  ?MCV 85.4 84.7  ?PLT 166 149*  ? ?Cardiac Enzymes: ?No results for input(s): CKTOTAL, CKMB, CKMBINDEX, TROPONINI in the last 168 hours. ? ?BNP (last 3 results) ?Recent Labs  ?  04/02/21 ?[id number]  ?BNP 455.5*  ? ? ?ProBNP (last 3 results) ?No results for input(s): PROBNP in the last 8760 hours. ? ?CBG: ?Recent Labs  ?Lab 03/11/22 ?2237 03/12/22 ?0009 03/12/22 ?0123 03/12/22 ?0154 03/12/22 ?[id number]  ?GLUCAP 164* 142* 128* 133* 116*  ? ? ?Radiological Exams on Admission: ?CT Head Wo Contrast ? ?Result Date: 03/11/2022 ?CLINICAL DATA:  Altered mental status. EXAM: CT HEAD WITHOUT CONTRAST TECHNIQUE: Contiguous axial images were obtained from the base of the skull through the vertex without intravenous contrast. RADIATION DOSE REDUCTION: This exam was performed according to the departmental dose-optimization program which includes automated exposure control, adjustment of the  mA and/or kV according to patient size and/or use of iterative reconstruction technique. COMPARISON:  Apr 02, 2021 FINDINGS: Brain: There is mild cerebral atrophy with widening of the extra-axial spaces and ventricular d

## 2022-03-12 NOTE — [place] FL2 (Signed)
?[place] MEDICAID FL2 LEVEL OF CARE SCREENING TOOL  ?  ? ?IDENTIFICATION  ?Patient Name: ?Christian [name] Birthdate: February 25, 1951 Sex: male Admission Date (Current Location): ?03/11/2022  ?[place] and [place] Number: ? [place] ?  Facility and Address:  ?[facility],  [address], [place] ?     Provider Number: ?[id number]  ?Attending Physician Name and Address:  ?[name], MD ? Relative Name and Phone Number:  ?[name] [phone] ?   ?Current Level of Care: ?Hospital Recommended Level of Care: ?[place] Prior Approval Number: ?  ? ?Date Approved/Denied: ?  PASRR Number: ?  ? ?Discharge Plan: ?SNF ?  ? ?Current Diagnoses: ?Patient Active Problem List  ? Diagnosis Date Noted  ? Hypoglycemia 03/12/2022  ? Cellulitis in diabetic foot (Palmer) 01/26/2022  ? Diabetic ulcer of ankle (Louisville) 01/26/2022  ? Sepsis ([place] R[place]) 04/02/2021  ? Lactic acidosis 04/02/2021  ? Hyponatremia 04/02/2021  ? Hyperkalemia 04/02/2021  ? Chronic kidney disease, stage 3b (Wacousta) 04/02/2021  ? Fever 04/02/2021  ? Septic shock (Henderson) 04/02/2021  ? Altered mental status   ? AKI (acute kidney injury) (Cottle)   ? CVA (cerebral vascular accident) (Sunset) 10/07/2020  ? Type 2 diabetes mellitus with stage 3 chronic kidney disease (Mocksville) 04/01/2020  ? CAD (coronary artery disease), native coronary artery 04/01/2020  ? NSTEMI (non-ST elevated myocardial infarction) (Lamont) 03/31/2020  ? Obesity, Class III, BMI 40-49.9 (morbid obesity) (Gwinn) 03/31/2020  ? Aspiration pneumonia of both lower lobes due to gastric secretions (Union) 03/21/2020  ? Sacral decubitus ulcer, stage II (Kaysville) 03/19/2020  ? Chronic diastolic CHF (congestive heart failure) (Tamaroa) 03/17/2020  ? Atrial flutter, paroxysmal (Loyall) 12/27/2019  ? Constipation 09/16/2018  ? Bipolar 1 disorder, depressed (Montgomery Village) 08/22/2018  ? Encephalopathy   ? Paroxysmal atrial fibrillation (Northampton) 06/18/2018  ? History of pulmonary embolism 06/17/2018  ? Generalized weakness  03/27/2018  ? Diabetes mellitus type 2 in nonobese (Glen Dale) 03/27/2018  ? Pulmonary embolism (Winnebago) 03/27/2018  ? Fall   ? Laceration of eyebrow   ? Bipolar I disorder, most recent episode depressed, severe w psychosis (Gresham) 12/22/2015  ? Bipolar affective disorder, depressed, severe, with psychotic behavior (Savoonga)   ? Bipolar I disorder, current or most recent episode depressed, with psychotic features (Furnas)   ? Severe bipolar I disorder with depression (Arcade)   ? Bipolar I disorder, most recent episode depressed, severe with psychotic features (Bellefonte)   ? Overdose 09/11/2015  ? Acute respiratory failure with hypoxia (Niles) 09/05/2015  ? Elevated troponin 09/05/2015  ? Somnolence 09/05/2015  ? Bipolar depression (Marvin) 09/05/2015  ? Acute bilateral deep vein thrombosis (DVT) of femoral veins (Panther Valley) 09/05/2015  ? Bilateral pulmonary embolism (Louisville) 09/02/2015  ? Labile hypertension   ? Pulmonary embolism with acute cor pulmonale (Steubenville)   ? Dyspnea   ? Orthostatic hypotension   ? Bipolar disorder, in partial remission, most recent episode depressed (Delta)   ? Syncope, near 08/31/2015  ? Affective psychosis, bipolar (Hayes Center)   ? Bipolar affective disorder, depressed, mild (Ault) 08/28/2015  ? Pressure ulcer 07/30/2015  ? Protein-calorie malnutrition, severe (Kittanning) 07/30/2015  ? Dehydration 07/29/2015  ? Diabetes (Wheeler) 07/09/2015  ? UTI (urinary tract infection) 06/19/2014  ? Positive blood culture 06/19/2014  ? Type 2 diabetes mellitus with complication, without long-term current use of insulin (Le Sueur) 06/02/2014  ? Other and unspecified hyperlipidemia 06/02/2014  ? OSA on CPAP 06/02/2014  ? Bilateral lower extremity edema: chronic with venous  stasis changes 06/02/2014  ? Gout 06/02/2014  ? Hypertension   ? ? ?Orientation RESPIRATION BLADDER Height & Weight   ?  ?Self, Time, Situation ? Normal Continent Weight: 117.9 kg ?Height:  6' (182.9 cm)  ?BEHAVIORAL SYMPTOMS/MOOD NEUROLOGICAL BOWEL NUTRITION STATUS  ?    Continent Diet (regular)   ?AMBULATORY STATUS COMMUNICATION OF NEEDS Skin   ?Limited Assist Verbally Normal ?  ?  ?  ?    ?     ?     ? ? ?Personal Care Assistance Level of Assistance  ?Bathing, Feeding, Dressing Bathing Assistance: Limited assistance ?Feeding assistance: Limited assistance ?Dressing Assistance: Limited assistance ?   ? ?Functional Limitations Info  ?Sight, Hearing, Speech Sight Info: Impaired (eyeglasses) ?  ?Speech Info: Impaired (dentures top)  ? ? ?SPECIAL CARE FACTORS FREQUENCY  ?PT (By licensed PT), OT (By licensed OT)   ?  ?PT Frequency:  (5x week) ?OT Frequency:  (5x week) ?  ?  ?  ?   ? ? ?Contractures Contractures Info: Not present  ? ? ?Additional Factors Info  ?Code Status, Allergies, Psychotropic Code Status Info:  (Full) ?Allergies Info:  (Amlodipine, Onglyza (Saxagliptin), Zyprexa (Olanzapine), Ritalin (Methylphenidate) ?Psychotropic Info:  (Wellbutrin) ?  ?  ?   ? ?Current Medications (03/12/2022):  This is the current hospital active medication list ?Current Facility-Administered Medications  ?Medication Dose Route Frequency Provider Last Rate Last Admin  ? ARIPiprazole (ABILIFY) tablet 20 mg  20 mg Oral Daily Irene Pap N, DO   20 mg at 03/12/22 1038  ? aspirin EC tablet 81 mg  81 mg Oral Daily Irene Pap N, DO   81 mg at 03/12/22 1038  ? atorvastatin (LIPITOR) tablet 10 mg  10 mg Oral q1800 Irene Pap N, DO      ? buPROPion [facility]) tablet 75 mg  75 mg Oral Daily [name], MD      ? divalproex (DEPAKOTE ER) 24 hr tablet 500 mg  500 mg Oral Daily [place]burg, Carole N, DO   500 mg at 03/12/22 1038  ? liothyronine (CYTOMEL) tablet 10 mcg  10 mcg Oral Daily Irene Pap N, DO   10 mcg at 03/12/22 1038  ? mirtazapine (REMERON) tablet 15 mg  15 mg Oral QHS [name], MD      ? QUEtiapine (SEROQUEL) tablet 25 mg  25 mg Oral QHS [name], MD      ? rivaroxaban [facility]) tablet 20 mg  20 mg Oral Q supper Kayleen Memos, DO      ? ? ? ?Discharge Medications: ?Please see discharge summary for a list  of discharge medications. ? ?Relevant Imaging Results: ? ?Relevant Lab Results: ? ? ?Additional Information ?SS#[id number] ? ?Dessa Phi, RN ? ? ? ? ?

## 2022-03-12 NOTE — TOC Initial Note (Addendum)
Transition of Care (TOC) - Initial/Assessment Note  ? ? ?Patient Details  ?Name: Christian [name] ?MRN: [id number] ?Date of Birth: 1951-06-03 ? ?Transition of Care [facility]) CM/SW Contact:    ?Dessa Phi, RN ?Phone Number: ?03/12/2022, 12:48 PM ? ?Clinical Narrative: Active w/[facility]-HHRN/PT/OT;[facility] active w/rollator-they will f/u if new rollator can be provided-patient has concerns about the current rollator @ home over 16yrs old. PT to eval.Has own transport home.   ? ?-4p-Patient recc SNF-only wants [facility] in [place](has gone there in past)-faxed out await bed offer.will need pasrr.              ? ? ?Expected Discharge Plan: [place] ?Barriers to Discharge: Continued Medical Work up ? ? ?Patient Goals and CMS Choice ?Patient states their goals for this hospitalization and ongoing recovery are:: home ?CMS Medicare.gov Compare Post Acute Care list provided to:: Patient ?Choice offered to / list presented to : Patient ? ?Expected Discharge Plan and Services ?Expected Discharge Plan: [place] ?  ?Discharge Planning Services: CM Consult ?Post Acute Care Choice: Durable Medical Equipment, Home Health ?Living arrangements for the past 2 months: [place] ?                ?  ?  ?  ?  ?  ?  ?  ?  ?  ?  ? ?Prior Living Arrangements/Services ?Living arrangements for the past 2 months: [place] ?Lives with:: Self ?Patient language and need for interpreter reviewed:: Yes ?Do you feel safe going back to the place where you live?: Yes      ?Need for Family Participation in Patient Care: Yes (Comment) ?Care giver support system in place?: Yes (comment) ?Current home services: DME, Home OT, Home PT, Home RN (rollator-[facility] active HHRN/PT/OT) ?Criminal Activity/Legal Involvement Pertinent to Current Situation/Hospitalization: No - Comment as needed ? ?Activities of Daily Living ?  ?  ? ?Permission Sought/Granted ?Permission sought to share  information with : Case Manager ?Permission granted to share information with : Yes, Verbal Permission Granted ? Share Information with NAME: Case manager ?   ?   ?   ? ?Emotional Assessment ?Appearance:: Appears stated age ?Attitude/Demeanor/Rapport: Gracious ?Affect (typically observed): Accepting ?Orientation: : Oriented to Self, Oriented to Place, Oriented to  Time, Oriented to Situation ?Alcohol / Substance Use: Not Applicable ?Psych Involvement: No (comment) ? ?Admission diagnosis:  Hypoglycemia [E16.2] ?AKI (acute kidney injury) (Georgetown) [N17.9] ?Patient Active Problem List  ? Diagnosis Date Noted  ? Hypoglycemia 03/12/2022  ? Cellulitis in diabetic foot (Lower Santan [place]) 01/26/2022  ? Diabetic ulcer of ankle ([place] Coffey[place]) 01/26/2022  ? Sepsis (Colonial Heights) 04/02/2021  ? Lactic acidosis 04/02/2021  ? Hyponatremia 04/02/2021  ? Hyperkalemia 04/02/2021  ? Chronic kidney disease, stage 3b (St. Paul) 04/02/2021  ? Fever 04/02/2021  ? Septic shock (Mill Creek) 04/02/2021  ? Altered mental status   ? AKI (acute kidney injury) ([place] Hampshire)   ? CVA (cerebral vascular accident) (Hardyville) 10/07/2020  ? Type 2 diabetes mellitus with stage 3 chronic kidney disease (Westwood Lakes) 04/01/2020  ? CAD (coronary artery disease), native coronary artery 04/01/2020  ? NSTEMI (non-ST elevated myocardial infarction) (Miles City) 03/31/2020  ? Obesity, Class III, BMI 40-49.9 (morbid obesity) (Chubbuck) 03/31/2020  ? Aspiration pneumonia of both lower lobes due to gastric secretions (Riva) 03/21/2020  ? Sacral decubitus ulcer, stage II (Cooke City) 03/19/2020  ? Chronic diastolic CHF (congestive heart failure) (Post Lake) 03/17/2020  ? Atrial flutter, paroxysmal (Richmond) 12/27/2019  ? Constipation 09/16/2018  ?  Bipolar 1 disorder, depressed (South Gifford) 08/22/2018  ? Encephalopathy   ? Paroxysmal atrial fibrillation (Villa Hills) 06/18/2018  ? History of pulmonary embolism 06/17/2018  ? Generalized weakness 03/27/2018  ? Diabetes mellitus type 2 in nonobese (Council Grove) 03/27/2018  ? Pulmonary embolism (Caldwell) 03/27/2018  ? Fall   ? Laceration of  eyebrow   ? Bipolar I disorder, most recent episode depressed, severe w psychosis (Bloomingburg) 12/22/2015  ? Bipolar affective disorder, depressed, severe, with psychotic behavior (Fairford)   ? Bipolar I disorder, current or most recent episode depressed, with psychotic features (Prospect)   ? Severe bipolar I disorder with depression (Callender Lake)   ? Bipolar I disorder, most recent episode depressed, severe with psychotic features (Keller)   ? Overdose 09/11/2015  ? Acute respiratory failure with hypoxia (Fort Myers) 09/05/2015  ? Elevated troponin 09/05/2015  ? Somnolence 09/05/2015  ? Bipolar depression (Smethport) 09/05/2015  ? Acute bilateral deep vein thrombosis (DVT) of femoral veins (Rhea) 09/05/2015  ? Bilateral pulmonary embolism (Tahlequah) 09/02/2015  ? Labile hypertension   ? Pulmonary embolism with acute cor pulmonale (Benicia)   ? Dyspnea   ? Orthostatic hypotension   ? Bipolar disorder, in partial remission, most recent episode depressed (Goessel)   ? Syncope, near 08/31/2015  ? Affective psychosis, bipolar (Lower Elochoman)   ? Bipolar affective disorder, depressed, mild (Parker) 08/28/2015  ? Pressure ulcer 07/30/2015  ? Protein-calorie malnutrition, severe (Southgate) 07/30/2015  ? Dehydration 07/29/2015  ? Diabetes (Fishers Landing) 07/09/2015  ? UTI (urinary tract infection) 06/19/2014  ? Positive blood culture 06/19/2014  ? Type 2 diabetes mellitus with complication, without long-term current use of insulin (Mesquite) 06/02/2014  ? Other and unspecified hyperlipidemia 06/02/2014  ? OSA on CPAP 06/02/2014  ? Bilateral lower extremity edema: chronic with venous stasis changes 06/02/2014  ? Gout 06/02/2014  ? Hypertension   ? ?PCP:  Donald Prose, MD ?Pharmacy:   ?[facility] [address] ?[address] ?[place] [address] ?Phone: [phone] Fax: [phone] ? ?[facility] ?[address] ?[place] [address] ?Phone: [phone] Fax: [phone] ? ? ? ? ?Social Determinants of Health (SDOH)  Interventions ?  ? ?Readmission Risk Interventions ? ?  01/28/2022  ? 10:17 AM  ?Readmission Risk Prevention Plan  ?Transportation Screening Complete  ?PCP or Specialist Appt within 5-7 Days Complete  ?Home Care Screening Complete  ?Medication Review (RN CM) Complete  ? ? ? ?

## 2022-03-12 NOTE — Care Management Obs Status (Signed)
MEDICARE OBSERVATION STATUS NOTIFICATION ? ? ?Patient Details  ?Name: Christian [name] ?MRN: [id number] ?Date of Birth: [birth year] ? ? ?Medicare Observation Status Notification Given:  Yes ? ? ? ?Lanier Clam, RN ?03/12/2022, 12:29 PM ?

## 2022-03-12 NOTE — Progress Notes (Addendum)
Inpatient Diabetes Program Recommendations ? ?AACE/ADA: New Consensus Statement on Inpatient Glycemic Control (2015) ? ?Target Ranges:  Prepandial:   less than 140 mg/dL ?     Peak postprandial:   less than 180 mg/dL (1-2 hours) ?     Critically ill patients:  140 - 180 mg/dL  ? ?Lab Results  ?Component Value Date  ? GLUCAP 153 (H) 03/12/2022  ? HGBA1C 7.7 (H) 04/03/2021  ? ? ?Review of Glycemic Control ? ?Diabetes history: DM2 ?Outpatient Diabetes medications: Lantus 10 units QD, Amaryl 2 mg QD, Jardiance 10 mg QD ?Current orders for Inpatient glycemic control: none ? ?Spoke with daughter (legal guardian) about DM regimen.  She states he administers his Lantus in the afternoon.  He did not administer yesterday as far as she knows but did take his morning oral medications.  She is uncertain what lead to severe hypoglycemia.  Attempted to call patient; no answer. Needs follow up with PCP.  May need to discontinue Amaryl at discharge.   ? ?Will continue to follow while inpatient. ? ?Thank you, ?Dulce Sellar, MSN, RN ?Diabetes Coordinator ?Inpatient Diabetes Program ?[phone] (team pager from 8a-5p) ? ? ? ? ? ?

## 2022-03-12 NOTE — Progress Notes (Signed)
OT Cancellation Note ? ?Patient Details ?Name: Christian [name] ?MRN: [id number] ?DOB: 1951/08/30 ? ? ?Cancelled Treatment:    Reason Eval/Treat Not Completed: Fatigue/lethargy limiting ability to participate. Per RN patient with severe lethargy. Unable to tolerate OT evaluation this a.m. OT to check back as time allows.  ? ?Pearce Littlefield H. OTR/L ?Supplemental OT, Department of rehab services [phone] ? ?Latavia Goga R H. ?03/12/2022, 9:31 AM ?

## 2022-03-12 NOTE — ED Provider Notes (Signed)
?[facility]-EMERGENCY DEPT ?Provider Note ? ? ?CSN: [id number] ?Arrival date & time: 03/11/22  1737 ? ?  ? ?History ? ?Chief Complaint  ?Patient presents with  ? Leg Swelling  ? ? ?Christian [name] is a 71 y.o. male. ? ?Christian [name] is a 71 y.o. male with a history of diabetes, hypertension, CHF, CKD, chronic lower extremity edema and venous stasis changes, bipolar disorder, who presents to the emergency department from doctor's office for evaluation of lower extremity edema and altered mental status.  Patient was noted to be quite lethargic at his doctor's appointment and wife at bedside reports that he is been more somnolent than usual over the past 2 days.  At office patient was reportedly hypoxic and had low blood pressure, but upon EMS arrival vitals were improved.  On arrival to the ED patient found to have a blood sugar of 39.  Patient also reports some mild shortness of breath, denies chest pain.  Denies abdominal pain, nausea or vomiting.  Wife does report some decreased intake over the past few days.  Wife reports that he has been inconsistently taking his medications. ? ?The history is provided by the patient, the spouse and medical records.  ? ?  ? ?Home Medications ?Prior to Admission medications   ?Medication Sig Start Date End Date Taking? Authorizing Provider  ?acetaminophen (TYLENOL) 500 MG tablet Take 500-1,000 mg by mouth every 6 (six) hours as needed (pain.).    [provider]  ?ARIPiprazole (ABILIFY) 20 MG tablet Take 1 tablet (20 mg total) by mouth daily. 02/06/20   Maryagnes Amos, FNP  ?aspirin EC 81 MG tablet Take 81 mg by mouth daily. Swallow whole.    [provider]  ?atorvastatin (LIPITOR) 10 MG tablet Take 1 tablet (10 mg total) by mouth daily at 6 PM. ?Patient taking differently: Take 10 mg by mouth daily. 02/05/20   Maryagnes Amos, FNP  ?buPROPion (WELLBUTRIN) 75 MG tablet Take 1 tablet (75 mg total) by mouth daily. 02/06/20    Maryagnes Amos, FNP  ?divalproex (DEPAKOTE ER) 500 MG 24 hr tablet Take 500 mg by mouth in the morning and at bedtime.    [provider]  ?empagliflozin (JARDIANCE) 10 MG TABS tablet Take 10 mg by mouth daily.  06/11/20   [provider]  ?furosemide (LASIX) 40 MG tablet Take Lasix 40 mg twice a day for 3 days then go back to Lasix 40 mg daily 01/28/22   Marinda Elk, MD  ?glimepiride (AMARYL) 2 MG tablet Take 2 mg by mouth daily.    [provider]  ?insulin glargine (LANTUS) 100 UNIT/ML injection Inject 10 Units into the skin daily.    [provider]  ?liothyronine (CYTOMEL) 5 MCG tablet Take 10 mcg by mouth daily.    [provider]  ?metoprolol succinate (TOPROL-XL) 50 MG 24 hr tablet Take 1 tablet (50 mg total) by mouth daily. Take with or immediately following a meal. 01/28/22 02/27/22  Marinda Elk, MD  ?mirtazapine (REMERON) 15 MG tablet Take 15 mg by mouth at bedtime. 05/01/21   [provider]  ?mupirocin ointment (BACTROBAN) 2 % APPLY 1 APPLICATION TOPICALLY DAILY. APPLIED TO TOE WOUND ?Patient taking differently: Apply 1 application. topically See admin instructions. 1 application daily to toe wound 12/16/21   Vivi Barrack, DPM  ?polyethylene glycol powder (GLYCOLAX/MIRALAX) 17 GM/SCOOP powder Take 17 g by mouth daily. ?Patient taking differently: Take 17 g by mouth daily  as needed for mild constipation. 10/14/20   Ghimire, Werner Lean, MD  ?QUEtiapine (SEROQUEL) 50 MG tablet Take 50 mg by mouth at bedtime.    [provider]  ?rivaroxaban (XARELTO) 20 MG TABS tablet Take 1 tablet (20 mg total) by mouth daily with supper. ?Patient taking differently: Take 20 mg by mouth at bedtime. 04/02/20   Dhungel, Theda Belfast, MD  ?   ? ?Allergies    ?Amlodipine, Onglyza [saxagliptin], Zyprexa [olanzapine], and Ritalin [methylphenidate]   ? ?Review of Systems   ?Review of Systems  ?Constitutional:  Positive for fatigue. Negative for chills  and fever.  ?HENT: Negative.    ?Respiratory:  Positive for shortness of breath. Negative for cough.   ?Cardiovascular:  Positive for leg swelling. Negative for chest pain.  ?Gastrointestinal:  Negative for abdominal pain, nausea and vomiting.  ?Genitourinary:  Negative for dysuria.  ?Musculoskeletal:  Negative for arthralgias and myalgias.  ?Skin:  Negative for color change and rash.  ?Neurological:  Positive for weakness (Generalized). Negative for dizziness, seizures, syncope and headaches.  ?All other systems reviewed and are negative. ? ?Physical Exam ?Updated Vital Signs ?BP 121/65   Pulse 79   Temp 97.7 ?F (36.5 ?C) (Oral)   Resp 14   SpO2 100%  ?Physical Exam ?Vitals and nursing note reviewed.  ?Constitutional:   ?   General: He is not in acute distress. ?   Appearance: Normal appearance. He is well-developed. He is not diaphoretic.  ?   Comments: Somewhat somnolent but easily awakens to verbal stimuli, chronically ill-appearing  ?HENT:  ?   Head: Normocephalic and atraumatic.  ?   Mouth/Throat:  ?   Mouth: Mucous membranes are dry.  ?   Pharynx: Oropharynx is clear.  ?Eyes:  ?   General:     ?   Right eye: No discharge.     ?   Left eye: No discharge.  ?   Pupils: Pupils are equal, round, and reactive to light.  ?Cardiovascular:  ?   Rate and Rhythm: Normal rate and regular rhythm.  ?   Pulses: Normal pulses.  ?   Heart sounds: Normal heart sounds.  ?Pulmonary:  ?   Effort: Pulmonary effort is normal. No respiratory distress.  ?   Breath sounds: Normal breath sounds. No wheezing or rales.  ?   Comments: Respirations equal and unlabored, patient able to speak in full sentences, lungs clear to auscultation bilaterally, some decreased air movement ?Abdominal:  ?   General: Bowel sounds are normal. There is no distension.  ?   Palpations: Abdomen is soft. There is no mass.  ?   Tenderness: There is no abdominal tenderness. There is no guarding.  ?   Comments: Abdomen soft, nondistended, nontender to  palpation in all quadrants without guarding or peritoneal signs  ?Musculoskeletal:     ?   General: No deformity.  ?   Cervical back: Neck supple.  ?   Right lower leg: Edema present.  ?   Left lower leg: Edema present.  ?   Comments: Severe chronic edema of bilateral lower extremities, bandage present to the left shin for chronic wound which is being followed by vascular surgery  ?Skin: ?   General: Skin is warm and dry.  ?   Capillary Refill: Capillary refill takes less than 2 seconds.  ?Neurological:  ?   Mental Status: He is alert and oriented to person, place, and time.  ?   Coordination: Coordination normal.  ?  Comments: Speech is clear, able to follow commands ?CN III-XII intact ?Normal strength in upper and lower extremities bilaterally including dorsiflexion and plantar flexion, strong and equal grip strength ?Sensation normal to light and sharp touch ?Moves extremities without ataxia, coordination intact  ?Psychiatric:     ?   Mood and Affect: Mood normal.     ?   Behavior: Behavior normal.  ? ? ?ED Results / Procedures / Treatments   ?Labs ?(all labs ordered are listed, but only abnormal results are displayed) ?Labs Reviewed  ?CBC - Abnormal; Notable for the following components:  ?    Result Value  ? WBC 3.6 (*)   ? MCH 25.4 (*)   ? MCHC 29.8 (*)   ? RDW 17.7 (*)   ? All other components within normal limits  ?URINALYSIS, ROUTINE W REFLEX MICROSCOPIC - Abnormal; Notable for the following components:  ? Color, Urine STRAW (*)   ? Glucose, UA >=500 (*)   ? Leukocytes,Ua SMALL (*)   ? All other components within normal limits  ?COMPREHENSIVE METABOLIC PANEL - Abnormal; Notable for the following components:  ? Sodium 134 (*)   ? Glucose, Bld 144 (*)   ? BUN 52 (*)   ? Creatinine, Ser 2.27 (*)   ? Albumin 3.4 (*)   ? GFR, Estimated 31 (*)   ? All other components within normal limits  ?CBG MONITORING, ED - Abnormal; Notable for the following components:  ? Glucose-Capillary 48 (*)   ? All other components  within normal limits  ?CBG MONITORING, ED - Abnormal; Notable for the following components:  ? Glucose-Capillary 39 (*)   ? All other components within normal limits  ?CBG MONITORING, ED - Abnormal; Notable for the fol

## 2022-03-12 NOTE — Evaluation (Signed)
Physical Therapy Evaluation ?Patient Details ?Name: Christian [name] ?MRN: [id number] ?DOB: Oct 16, 1951 ?Today's Date: 03/12/2022 ? ?History of Present Illness ? 71 y.o. male with medical history significant for peripheral artery disease followed by vascular surgery, bilateral lower extremity lymphedema, bipolar disorder, chronic diastolic CHF, gout, hypertension, OSA on CPAP, type 2 diabetes, was sent to [facility] ED from his vascular surgeon's office due to lethargy. Dx of AKI, hypoglycemia, RLE wound.  ?Clinical Impression ? Pt admitted with above diagnosis. Pt attempted sit to stand from elevated bed x 3 trials. Min assist of 2 required for pt to power up to standing with rollator. He was only able to stand for 5-10 seconds before BLEs buckled. He was not able to ambulate. He reports he's had several episodes of "sliding from the bed to the floor" at home, as well as a recent fall. He is not safe to DC home at present. ST-SNF recommended.  Pt currently with functional limitations due to the deficits listed below (see PT Problem List). Pt will benefit from skilled PT to increase their independence and safety with mobility to allow discharge to the venue listed below.   ?   ?   ? ?Recommendations for follow up therapy are one component of a multi-disciplinary discharge planning process, led by the attending physician.  Recommendations may be updated based on patient status, additional functional criteria and insurance authorization. ? ?Follow Up Recommendations Skilled nursing-short term rehab (<3 hours/day) ? ?  ?Assistance Recommended at Discharge Frequent or constant Supervision/Assistance  ?Patient can return home with the following ? Two people to help with walking and/or transfers;A lot of help with bathing/dressing/bathroom;Assistance with cooking/housework;Assist for transportation;Help with stairs or ramp for entrance ? ?  ?Equipment Recommendations Rollator (4 wheels)  ?Recommendations for Other Services ?    ?   ?Functional Status Assessment Patient has had a recent decline in their functional status and demonstrates the ability to make significant improvements in function in a reasonable and predictable amount of time.  ? ?  ?Precautions / Restrictions Precautions ?Precautions: Fall ?Precaution Comments: pt reports multiple "slides out of the bed to the floor" (for which he called EMS) and a few falls in past couple months ?Restrictions ?Weight Bearing Restrictions: No  ? ?  ? ?Mobility ? Bed Mobility ?  ?  ?  ?  ?  ?  ?  ?General bed mobility comments: sitting at EOB ?  ? ?Transfers ?Overall transfer level: Needs assistance ?Equipment used: Rollator (4 wheels) ?Transfers: Sit to/from Stand ?Sit to Stand: From elevated surface, Min assist, +2 safety/equipment, +2 physical assistance ?  ?  ?  ?  ?  ?General transfer comment: sit to stand x 3 trials with rollator, pt required assist to power up from elevated bed, only able to stand for 5-10 seconds each trial before BLEs buckled; pt able to independently perform lateral scoot towards HOB in seated position ?  ? ?Ambulation/Gait ?  ?  ?  ?  ?  ?  ?  ?General Gait Details: unable to side step in standing ? ?Stairs ?  ?  ?  ?  ?  ? ?Wheelchair Mobility ?  ? ?Modified Rankin (Stroke Patients Only) ?  ? ?  ? ?Balance Overall balance assessment: Needs assistance, History of Falls ?Sitting-balance support: Feet supported, No upper extremity supported ?Sitting balance-Leahy Scale: Fair ?  ?  ?Standing balance support: Bilateral upper extremity supported ?Standing balance-Leahy Scale: Poor ?Standing balance comment: reliant upon BUE support, only able  to stand for 5-10 seconds before BLEs buckled ?  ?  ?  ?  ?  ?  ?  ?  ?  ?  ?  ?   ? ? ? ?Pertinent Vitals/Pain Pain Assessment ?Pain Assessment: No/denies pain  ? ? ?Home Living Family/patient expects to be discharged to:: Private residence ?Living Arrangements: Alone ?Available Help at Discharge: Family;Available  PRN/intermittently ?Type of Home: Apartment ?Home Access: Level entry ?  ?  ?  ?Home Layout: One level ?Home Equipment: Rollator (4 wheels);Shower seat;Grab bars - toilet;Grab bars - tub/shower ?Additional Comments: Bed rail; pt reports brakes are broken on his rollator  ?  ?Prior Function Prior Level of Function : Needs assist ?  ?  ?  ?  ?  ?  ?Mobility Comments: was walking with rollator (and even without at times); independent with all mobility inside home; family drives him to appts ?ADLs Comments: family assists with bringing food, checks on him daily, and drives to appts ?  ? ? ?Hand Dominance  ? Dominant Hand: Right ? ?  ?Extremity/Trunk Assessment  ? Upper Extremity Assessment ?Upper Extremity Assessment: Defer to OT evaluation ?  ? ?Lower Extremity Assessment ?Lower Extremity Assessment: Generalized weakness;RLE deficits/detail;LLE deficits/detail ?RLE Deficits / Details: knee ext -4/5 ?LLE Deficits / Details: knee ext -4/5 ?  ? ?Cervical / Trunk Assessment ?Cervical / Trunk Assessment: Normal  ?Communication  ? Communication: No difficulties  ?Cognition Arousal/Alertness: Awake/alert ?Behavior During Therapy: [facility] for tasks assessed/performed ?Overall Cognitive Status: Within Functional Limits for tasks assessed ?  ?  ?  ?  ?  ?  ?  ?  ?  ?  ?  ?  ?  ?  ?  ?  ?  ?  ?  ? ?  ?General Comments   ? ?  ?Exercises    ? ?Assessment/Plan  ?  ?PT Assessment Patient needs continued PT services  ?PT Problem List Decreased activity tolerance;Decreased balance;Decreased strength;Decreased mobility ? ?   ?  ?PT Treatment Interventions Therapeutic activities;Therapeutic exercise;Functional mobility training;Balance training;Gait training;Patient/family education   ? ?PT Goals (Current goals can be found in the Care Plan section)  ?Acute Rehab PT Goals ?Patient Stated Goal: agreeable to ST-SNF to get strong enough to go home ?PT Goal Formulation: With patient ?Time For Goal Achievement: 03/26/22 ?Potential to Achieve  Goals: Good ? ?  ?Frequency Min 2X/week ?  ? ? ?Co-evaluation   ?  ?  ?  ?  ? ? ?  ?AM-PAC PT "6 Clicks" Mobility  ?Outcome Measure Help needed turning from your back to your side while in a flat bed without using bedrails?: A Little ?Help needed moving from lying on your back to sitting on the side of a flat bed without using bedrails?: A Lot ?Help needed moving to and from a bed to a chair (including a wheelchair)?: Total ?Help needed standing up from a chair using your arms (e.g., wheelchair or bedside chair)?: A Lot ?Help needed to walk in hospital room?: Total ?Help needed climbing 3-5 steps with a railing? : Total ?6 Click Score: 10 ? ?  ?End of Session Equipment Utilized During Treatment: Gait belt ?Activity Tolerance: Patient limited by fatigue ?Patient left: in bed;with bed alarm set;with call bell/phone within reach ?Nurse Communication: Mobility status ?PT Visit Diagnosis: Unsteadiness on feet (R26.81);History of falling (Z91.81);Repeated falls (R29.6);Difficulty in walking, not elsewhere classified (R26.2) ?  ? ?Time: [id number] ?PT Time Calculation (min) (ACUTE ONLY): 26 min ? ? ?Charges:  PT Evaluation ?$PT Eval Moderate Complexity: 1 Mod ?PT Treatments ?$Therapeutic Activity: 8-22 mins ?  ?   ? ? ?[name] PT 03/12/2022  ?Acute Rehabilitation Services ?Pager [phone] ?Office [phone] ? ? ?

## 2022-03-12 NOTE — Progress Notes (Signed)
Patient refuses nocturnal CPAP tonight. He states he has one at home but does not use it at all. Eduction provided. RN made aware.  ?

## 2022-03-12 NOTE — Progress Notes (Signed)
PROGRESS NOTE ? ?Brief Narrative: ?Christian [name] is a 71 y.o. male with a history of PAD, bilateral lower extremity edema, bipolar disorder, chronic HFpEF, OSA on CPAP, T2DM, HTN, and gout who was sent to [facility] 4/13 for lethargy in the vascular surgery office, found to be severely hypoglycemic to 39mg /dl. Creatinine was noted to be elevated to 2.2 from a baseline of 1.6. Mental status has improved but not normalized with dextrose-containing IVF infusion since admission earlier this morning. He is found to be severely weak and unsafe to discharge home. ? ?Subjective: ?Was poorly responsive to RN this morning, more interactive with me later in the morning. He is very thirsty and would like to eat. He does not know how he became so hypoglycemic, didn't change medications or eating habits that he knows of. He was also requiring NRB overnight per RN while sleeping, but has weaned to nasal cannula this morning. Doesn't use oxygen at home. ? ?Objective: ?BP 114/65 (BP Location: Right Arm)   Pulse 75   Temp 98.5 ?F (36.9 ?C) (Oral)   Resp 15   Ht 6' (1.829 m)   Wt 117.9 kg   SpO2 97%   BMI 35.25 kg/m?   ?Gen: Frail male in no distress ?Pulm: Clear and nonlabored.   ?CV: RRR, no murmur, no JVD, significant BL LE lymphedema. ?GI: Soft, NT, ND, +BS  ?Neuro: Drowsy but rousable, no focal deficits but diffusely weak. ?Skin: LLE ? ?Assessment & Plan: ?Christian [name] is a 71 y.o. male who has a history of T2DM on lantus and oral treatments who presented to the ED with hypoglycemic encephalopathy in the setting of AKI that has improved with IVF and dextrose, though not yet normalized. CT head and ABG are reassuring.  ?- Check BMP in AM. Creatinine improved but not yet at baseline. Will stop IVF in hopes that he can meet needs himself, hope to avoid overload given hx HFpEF.  ?- HbA1c 7.7% May 2022, will update to gauge ?chronic hypoglycemia  ?- Continue regular CBG checks and prn dextrose. Of course, holding sulfonylurea,  empagliflozin, and insulin. ? ?Patient appears frail, and I have concerns about his propensity toward falling. Said to be severely debilitated in prior notes and this acute episode is not helping. ?- PT/OT consulted. Based on today's evaluation SNF is recommended. TOC is made aware, already knew him to be active with home health, and will discuss with patient/family. ?- Check TSH  (on cytomel) ? ?Bipolar disorder: Will watch today. If persistently drowsy, would hold PM sedating psych medications. Otherwise, would really prefer to keep him on them. Will plan to decrease seroquel dose 50mg  > 25mg  qHS. ? ?Pancytopenia noted: Unclear etiology.  ?- If persistent would warrant work up. Recheck CBC in AM ? ?PAD, lymphedema: No significant venous disease per vascular surgery, Dr. 07-14-1994.  ?- Elevate legs, local wound care of superficial left leg wound. If wound isn't healing, (considerable) risks and benefits of intervention on PAD with retrograde tibial access will need to be discussed. ? ?  ? ? , MD ?Pager on amion ?03/12/2022, 3:18 PM   ?

## 2022-03-13 DIAGNOSIS — E162 Hypoglycemia, unspecified: Secondary | ICD-10-CM | POA: Diagnosis not present

## 2022-03-13 LAB — GLUCOSE, CAPILLARY
Glucose-Capillary: 127 mg/dL — ABNORMAL HIGH (ref 70–99)
Glucose-Capillary: 129 mg/dL — ABNORMAL HIGH (ref 70–99)
Glucose-Capillary: 158 mg/dL — ABNORMAL HIGH (ref 70–99)
Glucose-Capillary: 172 mg/dL — ABNORMAL HIGH (ref 70–99)
Glucose-Capillary: 188 mg/dL — ABNORMAL HIGH (ref 70–99)
Glucose-Capillary: 235 mg/dL — ABNORMAL HIGH (ref 70–99)

## 2022-03-13 LAB — BASIC METABOLIC PANEL
Anion gap: 5 (ref 5–15)
BUN: 35 mg/dL — ABNORMAL HIGH (ref 8–23)
CO2: 25 mmol/L (ref 22–32)
Calcium: 9.4 mg/dL (ref 8.9–10.3)
Chloride: 106 mmol/L (ref 98–111)
Creatinine, Ser: 1.51 mg/dL — ABNORMAL HIGH (ref 0.61–1.24)
GFR, Estimated: 50 mL/min — ABNORMAL LOW (ref >60)
Glucose, Bld: 128 mg/dL — ABNORMAL HIGH (ref 70–99)
Potassium: 5.1 mmol/L (ref 3.5–5.1)
Sodium: 136 mmol/L (ref 135–145)

## 2022-03-13 LAB — HEMOGLOBIN A1C
Hgb A1c MFr Bld: 7.6 % — ABNORMAL HIGH (ref 4.8–5.6)
Mean Plasma Glucose: 171.42 mg/dL

## 2022-03-13 LAB — CBC
HCT: 43.2 % (ref 39.0–52.0)
Hemoglobin: 13.2 g/dL (ref 13.0–17.0)
MCH: 25.7 pg — ABNORMAL LOW (ref 26.0–34.0)
MCHC: 30.6 g/dL (ref 30.0–36.0)
MCV: 84.2 fL (ref 80.0–100.0)
Platelets: 159 10*3/uL (ref 150–400)
RBC: 5.13 MIL/uL (ref 4.22–5.81)
RDW: 17.6 % — ABNORMAL HIGH (ref 11.5–15.5)
WBC: 3.7 10*3/uL — ABNORMAL LOW (ref 4.0–10.5)
nRBC: 0 % (ref 0.0–0.2)

## 2022-03-13 LAB — TSH: TSH: 0.903 u[IU]/mL (ref 0.350–4.500)

## 2022-03-13 LAB — CORTISOL-AM, BLOOD: Cortisol - AM: 7.5 ug/dL (ref 6.7–22.6)

## 2022-03-13 LAB — IMMATURE PLATELET FRACTION: Immature Platelet Fraction: 1 % — ABNORMAL LOW (ref 1.2–8.6)

## 2022-03-13 MED ORDER — QUETIAPINE FUMARATE 25 MG PO TABS
12.5000 mg | ORAL_TABLET | Freq: Every day | ORAL | Status: DC
  Administered 2022-03-13 – 2022-03-18 (×6): 12.5 mg via ORAL
  Filled 2022-03-13 (×6): qty 1

## 2022-03-13 NOTE — Evaluation (Signed)
Occupational Therapy Evaluation ?Patient Details ?Name: Christian [name] ?MRN: [id number] ?DOB: 1951/09/18 ?Today's Date: 03/13/2022 ? ? ?History of Present Illness 70 y.o. male with medical history significant for peripheral artery disease followed by vascular surgery, bilateral lower extremity lymphedema, bipolar disorder, chronic diastolic CHF, gout, hypertension, OSA on CPAP, type 2 diabetes, was sent to [facility] ED from his vascular surgeon's office due to lethargy. Dx of AKI, hypoglycemia, RLE wound.  ? ?Clinical Impression ?  ?Patient is a 71 year old male who was noted to have had a functional decline in ADLs. Patient was max A +2 for attempts to stand with about 20 seconds of standing tolerance prior to needing to sit back down with RW. Patient reports living at home with independence in ADLs at rollator level. Patient would benefit from short term rehab at SNF to increase safety and independence in ADLs. Patient would continue to benefit from skilled OT services at this time while admitted and after d/c to address noted deficits in order to improve overall safety and independence in ADLs.  ? ?   ? ?Recommendations for follow up therapy are one component of a multi-disciplinary discharge planning process, led by the attending physician.  Recommendations may be updated based on patient status, additional functional criteria and insurance authorization.  ? ?Follow Up Recommendations ? Skilled nursing-short term rehab (<3 hours/day)  ?  ?Assistance Recommended at Discharge Frequent or constant Supervision/Assistance  ?Patient can return home with the following Two people to help with walking and/or transfers;Two people to help with bathing/dressing/bathroom;Direct supervision/assist for medications management;Help with stairs or ramp for entrance;Assist for transportation;Direct supervision/assist for financial management;Assistance with cooking/housework ? ?  ?Functional Status Assessment ? Patient has had a recent  decline in their functional status and demonstrates the ability to make significant improvements in function in a reasonable and predictable amount of time.  ?Equipment Recommendations ? Other (comment) (RW)  ?  ?Recommendations for Other Services   ? ? ?  ?Precautions / Restrictions Precautions ?Precautions: Fall ?Restrictions ?Weight Bearing Restrictions: No  ? ?  ? ?Mobility Bed Mobility ?Overal bed mobility: Needs Assistance ?Bed Mobility: Supine to Sit ?  ?  ?Supine to sit: Min assist, HOB elevated ?  ?  ?  ?  ? ?Transfers ?  ?  ?  ?  ?  ?  ?  ?  ?  ?  ?  ? ?  ?Balance Overall balance assessment: Needs assistance, History of Falls ?Sitting-balance support: Feet supported, No upper extremity supported ?Sitting balance-Leahy Scale: Fair ?  ?  ?Standing balance support: Bilateral upper extremity supported, Reliant on assistive device for balance ?Standing balance-Leahy Scale: Poor ?  ?  ?  ?  ?  ?  ?  ?  ?  ?  ?  ?  ?   ? ?ADL either performed or assessed with clinical judgement  ? ?ADL Overall ADL's : Needs assistance/impaired ?Eating/Feeding: Set up;Sitting ?  ?Grooming: Wash/dry face;Minimal assistance;Sitting ?  ?Upper Body Bathing: Moderate assistance;Sitting ?  ?Lower Body Bathing: Total assistance;Bed level ?  ?Upper Body Dressing : Moderate assistance;Sitting ?  ?Lower Body Dressing: Total assistance;Bed level ?  ?Toilet Transfer: +2 for physical assistance;+2 for safety/equipment ?Toilet Transfer Details (indicate cue type and reason): patient was able to stand for about 20 seconds with noted shakiness in BLE with max A x2 and RW. patient was educated on RW v.s. rollator. patient reported that he does not like the RW. patient agreed to use it during  this session. patient was able to lift L UE and move slightly sideways during one of three standing attemtps but unable to complete standing transfer. patient was able to scoot transfer with max Ax2 with max cues for participation in tasks. patient reported  being fearful of falling with task with continued education on how patient can participate provided. ?Toileting- Clothing Manipulation and Hygiene: +2 for physical assistance;+2 for safety/equipment;Total assistance ?  ?  ?  ?  ?   ? ? ? ?Vision   ?Additional Comments: noted to prefer to keep eyes closed during session but able to keep open when cued. patient denies diziness.  ?   ?Perception   ?  ?Praxis   ?  ? ?Pertinent Vitals/Pain Pain Assessment ?Pain Assessment: No/denies pain  ? ? ? ?Hand Dominance Right ?  ?Extremity/Trunk Assessment Upper Extremity Assessment ?Upper Extremity Assessment: RUE deficits/detail;LUE deficits/detail ?RUE Deficits / Details: r/o RTC tear with conservative measures from police placing him in hand cuffs. patient able to abduct to about 80 degrees and FF to about 40 degrees. patient would abduct shoulders to about 80 degrees and then externally rotate UEs to grab items directly infront of him. ?LUE Deficits / Details: patient able to abduct to about 80 degrees and FF to about 40 degrees. patient would abduct shoulders to about 80 degrees and then externally rotate UEs to grab items directly infront of him. ?  ?Lower Extremity Assessment ?Lower Extremity Assessment: Defer to PT evaluation ?  ?Cervical / Trunk Assessment ?Cervical / Trunk Assessment: Normal ?  ?Communication Communication ?Communication: No difficulties ?  ?Cognition Arousal/Alertness: Lethargic ?Behavior During Therapy: Flat affect ?Overall Cognitive Status: Difficult to assess ?  ?  ?  ?  ?  ?  ?  ?  ?  ?  ?  ?  ?  ?  ?  ?  ?General Comments: patient was noted to have increased fear of falling wtih attempts at scoot transfers. ?  ?  ?General Comments    ? ?  ?Exercises   ?  ?Shoulder Instructions    ? ? ?Home Living Family/patient expects to be discharged to:: Private residence ?Living Arrangements: Alone ?Available Help at Discharge: Family;Available PRN/intermittently ?Type of Home: Apartment ?Home Access: Level  entry ?  ?  ?Home Layout: One level ?  ?  ?Bathroom Shower/Tub: Walk-in shower ?  ?Bathroom Toilet: Standard ?Bathroom Accessibility: Yes ?  ?Home Equipment: Rollator (4 wheels);Shower seat;Grab bars - toilet;Grab bars - tub/shower ?  ?Additional Comments: Bed rail; pt reports brakes are broken on his rollator ?  ? ?  ?Prior Functioning/Environment Prior Level of Function : Needs assist ?  ?  ?  ?  ?  ?  ?Mobility Comments: was walking with rollator (and even without at times); independent with all mobility inside home; family drives him to appts ?ADLs Comments: family assists with bringing food, checks on him daily, and drives to appts ?  ? ?  ?  ?OT Problem List: Decreased activity tolerance;Impaired balance (sitting and/or standing);Decreased safety awareness;Impaired UE functional use;Decreased knowledge of precautions;Decreased knowledge of use of DME or AE;Decreased coordination ?  ?   ?OT Treatment/Interventions: Self-care/ADL training;Therapeutic exercise;Neuromuscular education;Energy conservation;DME and/or AE instruction;Therapeutic activities;Balance training;Patient/family education  ?  ?OT Goals(Current goals can be found in the care plan section) Acute Rehab OT Goals ?Patient Stated Goal: to get back to rollator ?OT Goal Formulation: With patient ?Time For Goal Achievement: 03/27/22 ?Potential to Achieve Goals: Fair  ?OT Frequency: Min 2X/week ?  ? ?  Co-evaluation   ?  ?  ?  ?  ? ?  ?AM-PAC OT "6 Clicks" Daily Activity     ?Outcome Measure Help from another person eating meals?: A Little ?Help from another person taking care of personal grooming?: A Little ?Help from another person toileting, which includes using toliet, bedpan, or urinal?: Total ?Help from another person bathing (including washing, rinsing, drying)?: A Lot ?Help from another person to put on and taking off regular upper body clothing?: A Lot ?Help from another person to put on and taking off regular lower body clothing?: Total ?6  Click Score: 12 ?  ?End of Session Equipment Utilized During Treatment: Gait belt;Rolling walker (2 wheels) ?Nurse Communication: Other (comment);Mobility status (ok to participate in session) ? ?Activity [place]

## 2022-03-13 NOTE — TOC PASRR Note (Signed)
Transition of Care (TOC) -30 day Note   ?  ?  ?Patient Details  ?Name: Christian [name] ?MRN:  [id number] ?Date of Birth: 01/15/1951 ?  ?Transition of Care (TOC) CM/SW Contact  ?Name: Cecille Po, RN  ?Phone Number: ?Date: 03/13/2022 ?Time: 1333 ?  ?MUST ID:  ?  ?To Whom it May Concern: ?  ?Please be advised that the above patient will require a short-term nursing home stay, anticipated 30 days or less rehabilitation and strengthening. The plan is for return home.  ?   ?

## 2022-03-13 NOTE — Progress Notes (Signed)
?Progress Note ? ?PatientEyan Christian [name] Christian [name] [id number] DOB: Mar 18, 1951  ?DOA: 03/11/2022  DOS: 03/13/2022  ?  ?Brief hospital course: ?Christian Christian [name] is a 71 y.o. male with a history of PAD, bilateral lower extremity edema, bipolar disorder, chronic HFpEF, OSA on CPAP, T2DM, HTN, and gout who was sent to [facility] 4/13 for lethargy in the vascular surgery office, found to be severely hypoglycemic to 39mg /dl. Creatinine was noted to be elevated to 2.2 from a baseline of 1.6. Mental status has improved with dextrose infusion and subsequent oral intake. Hypoglycemic agents are held. The patient's level of debility requires SNF level of rehabilitation which is being pursued. His psychiatric status is stable.  ? ?Assessment and Plan: ?T2DM uncontrolled with hypoglycemia: HbA1c 7.6%. Cortisol sufficient. ?- Continue regular CBG checks and prn dextrose. Of course, holding sulfonylurea, empagliflozin, and insulin. ?  ?Significant debility: Patient appears frail, and I have concerns about his propensity toward falling. Said to be severely debilitated in prior notes and this acute episode is not helping. TSH wnl.  ?- PT/OT consulted and recommends rehabilitation at SNF. Not a safe discharge home at this time. TOC is consulted and working on this. He has gone to level II PASRR.  ?  ?AKI on stage IIIa CKD: This is based on his nadir Cr values placed CrCl just below 81ml/min. Improved with IVF to baseline Cr ~1.5. No longer requiring IVF.  ?- Avoid nephrotoxins. ? ?History of DVT:  ?- Continue xarelto ? ?Acute metabolic/hypoglycemic encephalopathy: Resolved.  ? ?Demand myocardial ischemia: Mild troponin elevation in setting of SKI and no angina is not consistent with ACS. No ischemic ECG findings either.  ?- No further inpatient evaluation is currently planned.  ? ?PAF:  ?- Continue metoprolol and xarelto ? ?Chronic HFpEF: No exacerbation at this time.  ? ?Hypothyroidism: TSH 0.9. ?- Continue home supplementation.  ? ?OSA:  ?-  CPAP qHS ? ?Bipolar disorder: Stable without oversedation on home medications.  ?- Continue current regimen.   ?  ?Pancytopenia noted: Unclear etiology, improved significantly.  ?- Suggest recheck CBC at follow up and if recurrent, would warrant work up.   ?  ?Left lower leg wound, PAD, lymphedema: No significant venous disease per vascular surgery, Dr. Scot Dock.  ?- Elevate legs, local wound care of superficial left leg wound, d/w RN. Wound appears with healthy tissue no infection. If wound isn't healing, (considerable) risks and benefits of intervention on PAD with retrograde tibial access will need to be discussed. ? ?Obesity: Estimated body mass index is 34.12 kg/m? as calculated from the following: ?  Height as of this encounter: 6' (1.829 m). ?  Weight as of this encounter: 114.1 kg. ? ?Subjective: No new complaints. Amenable to rehab and says he thinks he needs it. He's eating better and more alert.  ? ?Objective: ?Vitals:  ? 03/13/22 0409 03/13/22 0418 03/13/22 1452 03/13/22 1456  ?BP:  122/77 108/68   ?Pulse:  88 92 90  ?Resp:  18    ?Temp:  98.1 ?F (36.7 ?C) 98.4 ?F (36.9 ?C)   ?TempSrc:  Oral Oral   ?SpO2:  97% (!) 80% 98%  ?Weight: 114.1 kg     ?Height:      ? ?Gen: 71 y.o. male in no distress ?Pulm: Nonlabored breathing room air. Clear ?CV: Regular rate and rhythm. No murmur, rub, or gallop. No JVD, +BL LE lymphedema. ?GI: Abdomen soft, non-tender, non-distended, with normoactive bowel sounds.  ?Ext: Warm, no deformities ?Skin: Stable lymphedema with stable superficial  wound on left shin and smaller scattered blisters as pictured below.   ? ? ?Neuro: Alert and oriented. No focal neurological deficits. ?Psych: Judgement and insight appear fair. Mood euthymic & affect congruent. Behavior is appropriate.   ? ?Data Personally reviewed: ? ?CBC: ?Recent Labs  ?Lab 03/11/22 ?2247 03/12/22 ?[id number] 03/12/22 ?0355 03/13/22 ?[id number]  ?WBC 3.6* 3.4* 3.4* 3.7*  ?HGB 13.6 12.5* 12.7* 13.2  ?HCT 45.7 40.3 42.9 43.2  ?MCV  85.4 84.7 87.0 84.2  ?PLT 166 149* 125* 159  ? ?Basic Metabolic Panel: ?Recent Labs  ?Lab 03/11/22 ?2247 03/12/22 ?0355 03/13/22 ?[id number]  ?NA 134* 138 136  ?K 4.6 4.6 5.1  ?CL 98 104 106  ?CO2 28 24 25   ?GLUCOSE 144* 92 128*  ?BUN 52* 52* 35*  ?CREATININE 2.27* 2.08* 1.51*  ?CALCIUM 9.0 8.9 9.4  ?MG  --  2.2  --   ?PHOS  --  3.6  --   ? ?GFR: ?Estimated Creatinine Clearance: 61.1 mL/min (A) (by C-G formula based on SCr of 1.51 mg/dL (H)). ?Liver Function Tests: ?Recent Labs  ?Lab 03/11/22 ?2247  ?AST 29  ?ALT 34  ?ALKPHOS 62  ?BILITOT 0.4  ?PROT 7.5  ?ALBUMIN 3.4*  ? ?No results for input(s): LIPASE, AMYLASE in the last 168 hours. ?Recent Labs  ?Lab 03/11/22 ?2247  ?AMMONIA 13  ? ?Coagulation Profile: ?No results for input(s): INR, PROTIME in the last 168 hours. ?Cardiac Enzymes: ?No results for input(s): CKTOTAL, CKMB, CKMBINDEX, TROPONINI in the last 168 hours. ?BNP (last 3 results) ?No results for input(s): PROBNP in the last 8760 hours. ?HbA1C: ?Recent Labs  ?  03/13/22 ?[id number]  ?HGBA1C 7.6*  ? ?CBG: ?Recent Labs  ?Lab 03/12/22 ?2013 03/13/22 ?0012 03/13/22 ?0421 03/13/22 ?[id number] 03/13/22 ?1135  ?GLUCAP 117* 188* 129* 127* 235*  ? ?Lipid Profile: ?No results for input(s): CHOL, HDL, LDLCALC, TRIG, CHOLHDL, LDLDIRECT in the last 72 hours. ?Thyroid Function Tests: ?Recent Labs  ?  03/13/22 ?[id number]  ?TSH 0.903  ? ?Anemia Panel: ?No results for input(s): VITAMINB12, FOLATE, FERRITIN, TIBC, IRON, RETICCTPCT in the last 72 hours. ?Urine analysis: ?   ?Component Value Date/Time  ? COLORURINE STRAW (A) 03/11/2022 1953  ? APPEARANCEUR CLEAR 03/11/2022 1953  ? APPEARANCEUR Cloudy 08/01/2014 1614  ? LABSPEC 1.007 03/11/2022 1953  ? LABSPEC 1.008 08/01/2014 1614  ? PHURINE 5.0 03/11/2022 1953  ? GLUCOSEU >=500 (A) 03/11/2022 1953  ? GLUCOSEU Negative 08/01/2014 1614  ? [place] NEGATIVE 03/11/2022 1953  ? [place] NEGATIVE 03/11/2022 1953  ? BILIRUBINUR Negative 08/01/2014 1614  ? Benjamin Stain NEGATIVE 03/11/2022 1953  ? [place]  NEGATIVE 03/11/2022 1953  ? UROBILINOGEN 0.2 07/01/2014 1809  ? NITRITE NEGATIVE 03/11/2022 1953  ? LEUKOCYTESUR SMALL (A) 03/11/2022 1953  ? LEUKOCYTESUR Negative 08/01/2014 1614  ? ?No results found for this or any previous visit (from the past 240 hour(s)).   ?CT Head Wo Contrast ? ?Result Date: 03/11/2022 ?CLINICAL DATA:  Altered mental status. EXAM: CT HEAD WITHOUT CONTRAST TECHNIQUE: Contiguous axial images were obtained from the base of the skull through the vertex without intravenous contrast. RADIATION DOSE REDUCTION: This exam was performed according to the departmental dose-optimization program which includes automated exposure control, adjustment of the mA and/or kV according to patient size and/or use of iterative reconstruction technique. COMPARISON:  Apr 02, 2021 FINDINGS: Brain: There is mild cerebral atrophy with widening of the extra-axial spaces and ventricular dilatation. There are areas of decreased attenuation within the white matter tracts of the supratentorial brain, consistent with  microvascular disease changes. Vascular: No hyperdense vessel or unexpected calcification. Skull: Normal. Negative for fracture or focal lesion. Sinuses/Orbits: No acute finding. Other: The study is limited secondary to patient motion. IMPRESSION: 1. Limited study secondary to patient motion. 2. No acute intracranial abnormality. Electronically Signed   By: Virgina Norfolk M.D.   On: 03/11/2022 23:23  ? ?DG Chest Port 1 View ? ?Result Date: 03/11/2022 ?CLINICAL DATA:  Hypoxia and hypotension.  Bilateral leg edema. EXAM: PORTABLE CHEST 1 VIEW COMPARISON:  01/26/2022 FINDINGS: Heart size within normal limits. Vascularity normal. Negative for heart failure or edema. Lungs clear without infiltrate or effusion. IMPRESSION: No active disease. Electronically Signed   By: Franchot Gallo M.D.   On: 03/11/2022 19:35   ? ?Family Communication: None at bedside ? ?Disposition: ?Status is: Observation. Unable to safely DC home.  Needs SNF and is pending level 2 PASRR. ? ?[name], MD ?03/13/2022 2:57 PM ?Page by [email or web address]  ?

## 2022-03-14 DIAGNOSIS — E162 Hypoglycemia, unspecified: Secondary | ICD-10-CM | POA: Diagnosis not present

## 2022-03-14 LAB — GLUCOSE, CAPILLARY
Glucose-Capillary: 126 mg/dL — ABNORMAL HIGH (ref 70–99)
Glucose-Capillary: 126 mg/dL — ABNORMAL HIGH (ref 70–99)
Glucose-Capillary: 158 mg/dL — ABNORMAL HIGH (ref 70–99)
Glucose-Capillary: 178 mg/dL — ABNORMAL HIGH (ref 70–99)
Glucose-Capillary: 183 mg/dL — ABNORMAL HIGH (ref 70–99)
Glucose-Capillary: 194 mg/dL — ABNORMAL HIGH (ref 70–99)
Glucose-Capillary: 206 mg/dL — ABNORMAL HIGH (ref 70–99)
Glucose-Capillary: 258 mg/dL — ABNORMAL HIGH (ref 70–99)

## 2022-03-14 LAB — CBC
HCT: 42.7 % (ref 39.0–52.0)
Hemoglobin: 13.2 g/dL (ref 13.0–17.0)
MCH: 26.1 pg (ref 26.0–34.0)
MCHC: 30.9 g/dL (ref 30.0–36.0)
MCV: 84.4 fL (ref 80.0–100.0)
Platelets: 147 10*3/uL — ABNORMAL LOW (ref 150–400)
RBC: 5.06 MIL/uL (ref 4.22–5.81)
RDW: 17.8 % — ABNORMAL HIGH (ref 11.5–15.5)
WBC: 3.6 10*3/uL — ABNORMAL LOW (ref 4.0–10.5)
nRBC: 0 % (ref 0.0–0.2)

## 2022-03-14 LAB — BASIC METABOLIC PANEL
Anion gap: 5 (ref 5–15)
BUN: 23 mg/dL (ref 8–23)
CO2: 24 mmol/L (ref 22–32)
Calcium: 9 mg/dL (ref 8.9–10.3)
Chloride: 106 mmol/L (ref 98–111)
Creatinine, Ser: 1.53 mg/dL — ABNORMAL HIGH (ref 0.61–1.24)
GFR, Estimated: 49 mL/min — ABNORMAL LOW (ref >60)
Glucose, Bld: 228 mg/dL — ABNORMAL HIGH (ref 70–99)
Potassium: 5 mmol/L (ref 3.5–5.1)
Sodium: 135 mmol/L (ref 135–145)

## 2022-03-14 LAB — AMMONIA: Ammonia: 24 umol/L (ref 9–35)

## 2022-03-14 MED ORDER — DEXTROSE 50 % IV SOLN
INTRAVENOUS | Status: AC
  Filled 2022-03-14: qty 50

## 2022-03-14 MED ORDER — ACETAMINOPHEN 325 MG PO TABS
650.0000 mg | ORAL_TABLET | Freq: Four times a day (QID) | ORAL | Status: DC | PRN
Start: 2022-03-14 — End: 2022-03-19
  Administered 2022-03-14: 650 mg via ORAL
  Filled 2022-03-14 (×2): qty 2

## 2022-03-14 NOTE — Progress Notes (Signed)
?Progress Note ? ?PatientRonak Duquette [name] RAQ:[id number] DOB: 1951-03-17  ?DOA: 03/11/2022  DOS: 03/14/2022  ?  ?Brief hospital course: ?Christian [name] is a 71 y.o. male with a history of PAD, bilateral lower extremity edema, bipolar disorder, chronic HFpEF, OSA on CPAP, T2DM, HTN, and gout who was sent to [facility] 4/13 for lethargy in the vascular surgery office, found to be severely hypoglycemic to 39mg /dl. Creatinine was noted to be elevated to 2.2 from a baseline of 1.6. Mental status has improved with dextrose infusion and subsequent oral intake. Hypoglycemic agents are held. The patient's level of debility requires SNF level of rehabilitation which is being pursued. His psychiatric status is stable.  ? ?Assessment and Plan: ?T2DM uncontrolled with hypoglycemia: HbA1c 7.6%. Cortisol sufficient. ?- Continue regular CBG checks and prn dextrose. Of course, holding sulfonylurea, empagliflozin, and insulin. ?  ?Significant debility: Patient appears frail, and I have concerns about his propensity toward falling. Said to be severely debilitated in prior notes and this acute episode is not helping. TSH wnl.  ?- PT/OT consulted and recommends rehabilitation at SNF. Not a safe discharge home at this time. TOC is consulted and working on this. He has gone to level II PASRR. He is stable medically to be discharged at this time. ?  ?AKI on stage IIIa CKD: This is based on his nadir Cr values placed CrCl just below 54ml/min. Improved with IVF to baseline Cr ~1.5. No longer requiring IVF.  ?- Avoid nephrotoxins. ? ?History of DVT:  ?- Continue xarelto ? ?Acute metabolic/hypoglycemic encephalopathy: Resolved.  ? ?Demand myocardial ischemia: Mild troponin elevation in setting of SKI and no angina is not consistent with ACS. No ischemic ECG findings either.  ?- No further inpatient evaluation is currently planned.  ? ?PAF:  ?- Continue metoprolol and xarelto ? ?Chronic HFpEF: No exacerbation at this time.  ? ?Hypothyroidism: TSH  0.9. ?- Continue home supplementation.  ? ?OSA:  ?- CPAP qHS ? ?Bipolar disorder: Stable without oversedation on home medications.  ?- Continue current regimen.   ?  ?Pancytopenia noted: Unclear etiology, improved significantly.  ?- Suggest recheck CBC at follow up and if recurrent, would warrant work up.   ?  ?Left lower leg wound, PAD, lymphedema: No significant venous disease per vascular surgery, Dr. 72m.  ?- Elevate legs, local wound care of superficial left leg wound, d/w RN. Wound appears with healthy tissue no infection. If wound isn't healing, (considerable) risks and benefits of intervention on PAD with retrograde tibial access will need to be discussed. ? ?Obesity: Estimated body mass index is 34.18 kg/m? as calculated from the following: ?  Height as of this encounter: 6' (1.829 m). ?  Weight as of this encounter: 114.3 kg. ? ?Subjective: No new complaints. CBG 126 this AM. Eager to eat breakfast. Amenable to SNF.  ? ?Objective: ?Vitals:  ? 03/14/22 0036 03/14/22 0214 03/14/22 0435 03/14/22 03/16/22  ?BP: (!) 140/91 116/79  137/86  ?Pulse: 95 91  93  ?Resp: 17 15  16   ?Temp: 98.2 ?F (36.8 ?C) 98.7 ?F (37.1 ?C)  98.5 ?F (36.9 ?C)  ?TempSrc: Oral Oral  Oral  ?SpO2: 99% 99%  97%  ?Weight:   114.3 kg   ?Height:      ? ?Gen: 71 y.o. male in no distress ?Pulm: Nonlabored. ?CV: Regular. No JVD, stable BL LE lymphedema. ?Skin: No new rashes, lesions or ulcers on visualized skin. LLE dressing c/d/i ?Neuro: Alert and oriented. No focal neurological deficits. ?Psych: Judgement and  insight appear fair. Mood euthymic & affect congruent. Behavior is appropriate.   ? ? ?03/13/2022 ? ?Data Personally reviewed: ? ?CBC: ?Recent Labs  ?Lab 03/11/22 ?2247 03/12/22 ?8101 03/12/22 ?0355 03/13/22 ?7510  ?WBC 3.6* 3.4* 3.4* 3.7*  ?HGB 13.6 12.5* 12.7* 13.2  ?HCT 45.7 40.3 42.9 43.2  ?MCV 85.4 84.7 87.0 84.2  ?PLT 166 149* 125* 159  ? ?Basic Metabolic Panel: ?Recent Labs  ?Lab 03/11/22 ?2247 03/12/22 ?0355 03/13/22 ?2585  ?NA  134* 138 136  ?K 4.6 4.6 5.1  ?CL 98 104 106  ?CO2 28 24 25   ?GLUCOSE 144* 92 128*  ?BUN 52* 52* 35*  ?CREATININE 2.27* 2.08* 1.51*  ?CALCIUM 9.0 8.9 9.4  ?MG  --  2.2  --   ?PHOS  --  3.6  --   ? ?GFR: ?Estimated Creatinine Clearance: 61.1 mL/min (A) (by C-G formula based on SCr of 1.51 mg/dL (H)). ?Liver Function Tests: ?Recent Labs  ?Lab 03/11/22 ?2247  ?AST 29  ?ALT 34  ?ALKPHOS 62  ?BILITOT 0.4  ?PROT 7.5  ?ALBUMIN 3.4*  ? ?Recent Labs  ?Lab 03/11/22 ?2247  ?AMMONIA 13  ? ?HbA1C: ?Recent Labs  ?  03/13/22 ?03/15/22  ?HGBA1C 7.6*  ? ?CBG: ?Recent Labs  ?Lab 03/13/22 ?1704 03/13/22 ?1949 03/14/22 ?03/16/22 03/14/22 ?03/16/22 03/14/22 ?0744  ?GLUCAP 158* 172* 178* 158* 126*  ? ?Thyroid Function Tests: ?Recent Labs  ?  03/13/22 ?03/15/22  ?TSH 0.903  ? ?Urine analysis: ?   ?Component Value Date/Time  ? COLORURINE STRAW (A) 03/11/2022 1953  ? APPEARANCEUR CLEAR 03/11/2022 1953  ? APPEARANCEUR Cloudy 08/01/2014 1614  ? LABSPEC 1.007 03/11/2022 1953  ? LABSPEC 1.008 08/01/2014 1614  ? PHURINE 5.0 03/11/2022 1953  ? GLUCOSEU >=500 (A) 03/11/2022 1953  ? GLUCOSEU Negative 08/01/2014 1614  ? HGBUR NEGATIVE 03/11/2022 1953  ? BILIRUBINUR NEGATIVE 03/11/2022 1953  ? BILIRUBINUR Negative 08/01/2014 1614  ? 10/01/2014 NEGATIVE 03/11/2022 1953  ? PROTEINUR NEGATIVE 03/11/2022 1953  ? UROBILINOGEN 0.2 07/01/2014 1809  ? NITRITE NEGATIVE 03/11/2022 1953  ? LEUKOCYTESUR SMALL (A) 03/11/2022 1953  ? LEUKOCYTESUR Negative 08/01/2014 1614  ? ?Family Communication: None at bedside ? ?Disposition: ?Status is: Observation. Unable to safely DC home. Needs SNF and is pending level 2 PASRR. He is medically stable for discharge to SNF. ? ?10/01/2014, MD ?03/14/2022 8:45 AM ?Page by [email or web address]  ?

## 2022-03-14 NOTE — Progress Notes (Signed)
The patient is more awake, alert, and oriented x4. Will continue to monitor.  ?

## 2022-03-14 NOTE — Consult Note (Signed)
WOC Nurse Consult Note: ?Reason for Consult: Ruptured blister and chronic nonhealing full thickness wound on LLE. ?Wound type:Severe tibial artery occlusive disease (per Dr. Edilia Bo, Vascular Surgery) ?Pressure Injury POA: N/A ?Measurement: ?LLE pretibial: 6cm x 5cm  ?Intact blister (proximal to other lesion): 1cm x 2cm  ?Wound bed:Red, moist ?Drainage (amount, consistency, odor) scant ?Periwound: with evidence of previous healing (scarring), dry ?Dressing procedure/placement/frequency:I have provided Nursing with conservative guidance for the care of these wounds using a NS cleanse, pat dry and covering with an antimicrobial nonadherent gauze (xeroform) topped with dry gauze, and ABD pad and secured with a few turns of Kerlix roll gauze/paper tape.  This is to be changed daily. Heels are to be floated to prevent PI. ?Patient is followed by Vascular Surgery in the community and last say Dr. Edilia Bo for these wounds on 03/11/22. ? ? ?WOC nursing team will not follow, but will remain available to this patient, the nursing and medical teams.  Please re-consult if needed. ?Thanks, ?Ladona Mow, MSN, RN, GNP, CWOCN, CWON-AP, FAAN  ?Pager# [phone]  ? ?  ?

## 2022-03-14 NOTE — Progress Notes (Addendum)
?   03/14/22 1022  ?Vitals  ?BP 119/80  ?MAP (mmHg) 90  ?BP Location Left Arm  ?BP Method Automatic  ?Patient Position (if appropriate) Lying  ?Pulse Rate 91  ?Pulse Rate Source Monitor  ?MEWS COLOR  ?MEWS Score Color Green  ?Oxygen Therapy  ?SpO2 96 %  ?O2 Device Room Air  ?MEWS Score  ?MEWS Temp 0  ?MEWS Systolic 0  ?MEWS Pulse 0  ?MEWS RR 0  ?MEWS LOC 0  ?MEWS Score 0  ? ?The patient is alert and oriented to self and place. He appears lethargic but responsive to pain. Provider paged to assist. CBG 256 ?

## 2022-03-15 DIAGNOSIS — E162 Hypoglycemia, unspecified: Secondary | ICD-10-CM | POA: Diagnosis not present

## 2022-03-15 LAB — GLUCOSE, CAPILLARY
Glucose-Capillary: 160 mg/dL — ABNORMAL HIGH (ref 70–99)
Glucose-Capillary: 170 mg/dL — ABNORMAL HIGH (ref 70–99)
Glucose-Capillary: 180 mg/dL — ABNORMAL HIGH (ref 70–99)
Glucose-Capillary: 181 mg/dL — ABNORMAL HIGH (ref 70–99)
Glucose-Capillary: 185 mg/dL — ABNORMAL HIGH (ref 70–99)
Glucose-Capillary: 256 mg/dL — ABNORMAL HIGH (ref 70–99)

## 2022-03-15 NOTE — NC FL2 (Signed)
?[place] MEDICAID FL2 LEVEL OF CARE SCREENING TOOL  ?  ? ?IDENTIFICATION  ?Patient Name: ?Christian [name] Birthdate: 1951-11-04 Sex: male Admission Date (Current Location): ?03/11/2022  ?[place] and [place] Number: ? [place] ?  Facility and Address:  ?[facility],  [place], [place] ?     Provider Number: ?[id number]  ?Attending Physician Name and Address:  ?[name], MD ? Relative Name and Phone Number:  ?[name] [phone] ?   ?Current Level of Care: ?Hospital Recommended Level of Care: ?[place] Prior Approval Number: ?  ? ?Date Approved/Denied: ?  PASRR Number: ?[id number] ? ?Discharge Plan: ?SNF ?  ? ?Current Diagnoses: ?Patient Active Problem List  ? Diagnosis Date Noted  ? Hypoglycemia 03/12/2022  ? Cellulitis in diabetic foot ([place]) 01/26/2022  ? Diabetic ulcer of ankle ([place]) 01/26/2022  ? Sepsis ([place]) 04/02/2021  ? Lactic acidosis 04/02/2021  ? Hyponatremia 04/02/2021  ? Hyperkalemia 04/02/2021  ? Chronic kidney disease, stage 3b ([place]) 04/02/2021  ? Fever 04/02/2021  ? Septic shock ([place]) 04/02/2021  ? Altered mental status   ? AKI (acute kidney injury) ([place])   ? CVA (cerebral vascular accident) ([place]) 10/07/2020  ? Type 2 diabetes mellitus with stage 3 chronic kidney disease ([place]) 04/01/2020  ? CAD (coronary artery disease), native coronary artery 04/01/2020  ? NSTEMI (non-ST elevated myocardial infarction) ([place]) 03/31/2020  ? Obesity, Class III, BMI 40-49.9 (morbid obesity) ([place]) 03/31/2020  ? Aspiration pneumonia of both lower lobes due to gastric secretions ([place]) 03/21/2020  ? Sacral decubitus ulcer, stage II ([place]) 03/19/2020  ? Chronic diastolic CHF (congestive heart failure) ([place]) 03/17/2020  ? Atrial flutter, paroxysmal ([place]) 12/27/2019  ? Constipation 09/16/2018  ? Bipolar 1 disorder, depressed ([place]) 08/22/2018  ? Encephalopathy   ? Paroxysmal atrial fibrillation ([place]) 06/18/2018  ? History of pulmonary embolism 06/17/2018  ? Generalized  weakness 03/27/2018  ? Diabetes mellitus type 2 in nonobese ([place]) 03/27/2018  ? Pulmonary embolism ([place]) 03/27/2018  ? Fall   ? Laceration of eyebrow   ? Bipolar I disorder, most recent episode depressed, severe w psychosis ([place]) 12/22/2015  ? Bipolar affective disorder, depressed, severe, with psychotic behavior ([place])   ? Bipolar I disorder, current or most recent episode depressed, with psychotic features ([place])   ? Severe bipolar I disorder with depression ([place])   ? Bipolar I disorder, most recent episode depressed, severe with psychotic features ([place])   ? Overdose 09/11/2015  ? Acute respiratory failure with hypoxia ([place]) 09/05/2015  ? Elevated troponin 09/05/2015  ? Somnolence 09/05/2015  ? Bipolar depression ([place]) 09/05/2015  ? Acute bilateral deep vein thrombosis (DVT) of femoral veins ([place]) 09/05/2015  ? Bilateral pulmonary embolism ([place]) 09/02/2015  ? Labile hypertension   ? Pulmonary embolism with acute cor pulmonale ([place])   ? Dyspnea   ? Orthostatic hypotension   ? Bipolar disorder, in partial remission, most recent episode depressed ([place])   ? Syncope, near 08/31/2015  ? Affective psychosis, bipolar ([place])   ? Bipolar affective disorder, depressed, mild ([place]) 08/28/2015  ? Pressure ulcer 07/30/2015  ? Protein-calorie malnutrition, severe ([place]) 07/30/2015  ? Dehydration 07/29/2015  ? Diabetes ([place]) 07/09/2015  ? UTI (urinary tract infection) 06/19/2014  ? Positive blood culture 06/19/2014  ? Type 2 diabetes mellitus with complication, without long-term current use of insulin ([place]) 06/02/2014  ? Other and unspecified hyperlipidemia 06/02/2014  ? OSA on CPAP 06/02/2014  ? Bilateral lower extremity edema: chronic with venous stasis  changes 06/02/2014  ? Gout 06/02/2014  ? Hypertension   ? ? ?Orientation RESPIRATION BLADDER Height & Weight   ?  ?Self, Time, Situation ? Normal Continent Weight: 115.6 kg ?Height:  6' (182.9 cm)  ?BEHAVIORAL SYMPTOMS/MOOD NEUROLOGICAL BOWEL NUTRITION STATUS  ?    Continent Diet  (regular)  ?AMBULATORY STATUS COMMUNICATION OF NEEDS Skin   ?Limited Assist Verbally Normal ?  ?  ?  ?    ?     ?     ? ? ?Personal Care Assistance Level of Assistance  ?Bathing, Feeding, Dressing Bathing Assistance: Limited assistance ?Feeding assistance: Limited assistance ?Dressing Assistance: Limited assistance ?   ? ?Functional Limitations Info  ?Sight, Hearing, Speech Sight Info: Impaired (eyeglasses) ?  ?Speech Info: Impaired (dentures top)  ? ? ?SPECIAL CARE FACTORS FREQUENCY  ?PT (By licensed PT), OT (By licensed OT)   ?  ?PT Frequency:  (5x week) ?OT Frequency:  (5x week) ?  ?  ?  ?   ? ? ?Contractures Contractures Info: Not present  ? ? ?Additional Factors Info  ?Code Status, Allergies, Psychotropic Code Status Info:  (Full) ?Allergies Info:  (Amlodipine, Onglyza (Saxagliptin), Zyprexa (Olanzapine), Ritalin (Methylphenidate) ?Psychotropic Info:  (Wellbutrin) ?  ?  ?   ? ?Current Medications (03/15/2022):  This is the current hospital active medication list ?Current Facility-Administered Medications  ?Medication Dose Route Frequency Provider Last Rate Last Admin  ? acetaminophen (TYLENOL) tablet 650 mg  650 mg Oral Q6H PRN Hollace [place]ward K, NP   650 mg at 03/14/22 0431  ? ARIPiprazole (ABILIFY) tablet 20 mg  20 mg Oral Daily Irene Pap N, DO   20 mg at 03/15/22 [id number]  ? aspirin EC tablet 81 mg  81 mg Oral Daily Kayleen Memos, DO   81 mg at 03/15/22 [id number]  ? atorvastatin (LIPITOR) tablet 10 mg  10 mg Oral q1800 Irene Pap N, DO   10 mg at 03/14/22 1721  ? buPROPion [facility]) tablet 75 mg  75 mg Oral Daily [name], MD   75 mg at 03/15/22 [id number]  ? divalproex (DEPAKOTE ER) 24 hr tablet 500 mg  500 mg Oral Daily Irene Pap N, DO   500 mg at 03/15/22 [id number]  ? liothyronine (CYTOMEL) tablet 10 mcg  10 mcg Oral Daily Irene Pap N, DO   10 mcg at 03/15/22 [id number]  ? mirtazapine (REMERON) tablet 15 mg  15 mg Oral QHS [name], MD   15 mg at 03/14/22 2115  ? QUEtiapine (SEROQUEL) tablet 12.5 mg  12.5 mg  Oral QHS Hollace Hayward K, NP   12.5 mg at 03/14/22 2115  ? rivaroxaban (XARELTO) tablet 20 mg  20 mg Oral Q supper Irene Pap N, DO   20 mg at 03/14/22 1721  ? ? ? ?Discharge Medications: ?Please see discharge summary for a list of discharge medications. ? ?Relevant Imaging Results: ? ?Relevant Lab Results: ? ? ?Additional Information ?SS#[id number] ? ?Dessa Phi, RN ? ? ? ? ?

## 2022-03-15 NOTE — Progress Notes (Signed)
?Progress Note ? ?PatientTyris Salsedo [name] [id number] DOB: 08-15-1951  ?DOA: 03/11/2022  DOS: 03/15/2022  ?  ?Brief hospital course: ?Christian [name] is a 71 y.o. male with a history of PAD, bilateral lower extremity edema, bipolar disorder, chronic HFpEF, OSA on CPAP, T2DM, HTN, and gout who was sent to [facility] 4/13 for lethargy in the vascular surgery office, found to be severely hypoglycemic to 39mg /dl. Creatinine was noted to be elevated to 2.2 from a baseline of 1.6. Mental status has improved with dextrose infusion and subsequent oral intake. Hypoglycemic agents are held. The patient's level of debility requires SNF level of rehabilitation which is being pursued. His psychiatric status is stable.  ? ?Assessment and Plan: ?T2DM uncontrolled with hypoglycemia: HbA1c 7.6%. Cortisol sufficient. ?- Continue regular CBG checks and prn dextrose. Glycemic control has overall been acceptable. May need to initiate very sensitive SSI. We are holding home meds. ?  ?Significant debility: Patient appears frail, and I have concerns about his propensity toward falling. Said to be severely debilitated in prior notes and this acute episode is not helping. TSH wnl.  ?- PT/OT consulted and recommends rehabilitation at SNF. Not a safe discharge home at this time. TOC is consulted and working on this. He has gone to level II PASRR. He is stable medically to be discharged at this time. ?  ?AKI on stage IIIa CKD: This is based on his nadir Cr values placed CrCl just below 41ml/min. Improved with IVF to baseline Cr ~1.5. No longer requiring IVF.  ?- Avoid nephrotoxins. ? ?History of DVT:  ?- Continue xarelto ? ?Acute metabolic/hypoglycemic encephalopathy: Resolved.  ?- Also lowered dose of sedating medications which seems to have improved his mentation without any worsening of mood.  ? ?Demand myocardial ischemia: Mild troponin elevation in setting of SKI and no angina is not consistent with ACS. No ischemic ECG findings either.  ?-  No further inpatient evaluation is currently planned.  ? ?PAF:  ?- Continue metoprolol and xarelto ? ?Chronic HFpEF: No exacerbation at this time.  ? ?Hypothyroidism: TSH 0.9. ?- Continue home supplementation.  ? ?OSA:  ?- CPAP qHS ? ?Bipolar disorder: Stable. Quiescent. ?- Continue current regimen with lowered seroquel dose, depakote only once daily while admitted, and stable home dose abilify 20mg . ?  ?Pancytopenia noted: Unclear etiology, improved significantly.  ?- Suggest recheck CBC at follow up and if recurrent, would warrant work up.   ?  ?Left lower leg wound, PAD, lymphedema: No significant venous disease per vascular surgery, Dr. Scot Dock.  ?- Elevate legs, local wound care of superficial left leg wound, d/w RN. Wound appears with healthy tissue no infection. If wound isn't healing, (considerable) risks and benefits of intervention on PAD with retrograde tibial access will need to be discussed. ? ?Obesity: Estimated body mass index is 34.56 kg/m? as calculated from the following: ?  Height as of this encounter: 6' (1.829 m). ?  Weight as of this encounter: 115.6 kg. ? ?Subjective: No new complaints. Says his seroquel dose has been too high and that causes him to be sleepy. Feels otherwise well, amenable to DC to SNF. ? ?Objective: ?Vitals:  ? 03/14/22 1022 03/14/22 1947 03/15/22 0416 03/15/22 0418  ?BP: 119/80 123/78 (!) 152/86   ?Pulse: 91 93 (!) 104   ?Resp:  18 19   ?Temp:  98.9 ?F (37.2 ?C) 98.9 ?F (37.2 ?C)   ?TempSrc:  Oral Oral   ?SpO2: 96% 100% 96%   ?Weight:    115.6 kg  ?  Height:      ? ?Gen: Pleasant, alert interactive male in no distress ?Pulm: Clear, nonlabored ?CV: RRR. Stable BL lymphedema but no JVD ?Skin: LLE dressing/wrap is c/d/i ?Neuro: Alert, oriented, no deficits ?Psych: Judgement and insight appear intact. Normal mood and affect. ? ? ?03/13/2022 ? ?Glucose between 160-206 over past 24 hours ? ?Family Communication: None at bedside ? ?Disposition: ?Status is: Observation. Unable to  safely DC home. Needs SNF and is pending level 2 PASRR. He is medically stable for discharge to SNF. ? ?[name], MD ?03/15/2022 12:40 PM ?Page by [email or web address]  ?

## 2022-03-15 NOTE — TOC Progression Note (Addendum)
Transition of Care (TOC) - Progression Note  ? ? ?Patient Details  ?Name: Christian [name] ?MRN: [id number] ?Date of Birth: Jul 21, 1951 ? ?Transition of Care (TOC) CM/SW Contact  ?Dessa Phi, RN ?Phone Number: ?03/15/2022, 9:44 AM ? ?Clinical Narrative:   Received pasrr. Awaiting call back from [place] if able to accept.  ?-NOT [facility]-Patient chose [employer] rep Ebony Hail will start auth.awaiting auth. ?-MD notified to have PT see patient prior auth. ? ?1. ?1.3 mi ?[facility] [facility] ?[address] ?[place], [place] [address] ?[phone] ?Overall rating ?Average ?2. ?1.6 mi ?[facility] ?[place] ?[place], [place] [address] ?[phone] ?Overall rating ?Much below average ?3. ?2.1 mi ?[place] ?[place] ?[place], [place] [address] ?[phone] ?Overall rating ?Much below average ?4. ?2.5 mi ?[facility] ?[address] ?[place], [place] [address] ?[phone] ?Overall rating ?Below average ?5. ?2.8 mi ?[facility] ?[place], [place] [address] ?[phone] ?Overall rating ?Average ?6. ?2.8 mi ?[facility] ?[address] ?[place], [place] [address] ?[phone] ?Overall rating ?Much below average ?7. ?3 mi ?[place] ?[place] ?[place], [place] [address] ?[phone] ?Overall rating ?Much above average ?8. ?3.6 mi ?[place] ?[address] ?[place], [place] [address] ?[phone] ?Overall rating ?Above average ?9. ?3.6 mi ?[facility] ?[place] ?[place], [place] [address] ?[phone] ?Overall rating ?Much below average ?10. ?3.9 mi ?[place] ?[place] ?[place], [place] [address] ?[phone] ?Overall rating ?Much  below average ?11. ?4.4 mi ?[facility] ?[place] ?[place], [place] [address] ?[phone] ?Overall rating ?Much above average ?12. ?5.5 mi ?[facility] ?[address] ?[place], [place] [address] ?[phone] ?Overall rating ?Above average ?13. ?8.2 mi ?[place] ?[place] ?[place], [place] [address] ?[phone] ?Overall rating ?Much above average ?14. ?9 mi ?[facility] ?[address] ?[place], [place] [address] ?[phone] ?Overall rating ?Above average ?15. ?9.1 mi ?[facility] ?[place] ?[place], [place] [address] ?[phone] ?Overall rating ?Average ?16. ?9.2 mi ?[place] ?[address] ?[place], [place] [address] ?[phone] ?Overall rating ?Much above average ?17. ?10.8 mi ?[facility] [place] ?[address] ?[place], [place] [address] ?[phone] ?Overall rating ?Much above average ?18. ?12.6 mi ?[facility] ?[place], [place] [address] ?[phone] ?Overall rating ?Much below average ?19. ?12.8 mi ?[place] ?[address] ?[place], [place] [address] ?[phone] ?Overall rating ?Much below average ?20. ?14.2 mi ?[facility] ?[address] ?[place], [place] [address] ?[phone] ?Overall rating ?Below average ?21. ?14.4 mi ?[facility] ?[address] ?[place], [place] [address] ?[phone] ?Overall rating ?Above average ?22. ?14.8 mi ?[place] ?[address] ?[place], [place] [address] ?[phone] ?Overall rating ?Much above average ?23. ?14.9 mi ?[place] ?[place] ?[place],  [address] ?[phone] ?Overall rating ?Much below average ?24. ?16.5 mi ?[facility] ?[address] ?[place],  [address] ?[phone] ?Overall  rating ?Average ?25. ?16.7 mi ?[place] ?[place] ?[place],  [place] [address] ?([phone] ?Overall rating ?Much above average ?26. ?17.9 mi ?[place] ?[address] ?[place], [place] [address] ?[phone] ?Overall rating ?Below average ?27. ?18.1 mi ?[facility] ?[address] ?[place], [place] [address] ?[phone] ?Overall rating ?Much below average ?28. ?19.7 mi ?[place] ?[place], [place] [address] ?[phone] ?Overall rating ?Much below average ?29. ?20 mi ?[facility] [place] ?[place], [place] [address] ?[phone] ?Overall rating ?Much above average ?30. ?21.1 mi ?[place] ?[place] ?[place], [place] [address] ?[phone] ?Overall rating ?Much below average ?31. ?21.6 mi ?[facility] [address] ?[place] ?[place], [place] [address] ?[phone] ?Overall rating ?Below average ?32. ?21.6 mi ?[facility] ?[place] ?[place], [place] [address] ?[phone] ?Overall rating ?Average ?33. ?21.8 mi ?[place] ?[place] ?[place], [place] [address] ?[phone] ?Overall rating ?Much above average ?34. ?22 mi ?[facility] ?[address] ?[place], [place] [address] ?[phone] ?Overall rating ?Much above average ?35. ?22.6 mi ?[place] ?[address] ?[place], [place] [address] ?[phone] ?Overall rating ?Average ?36. ?22.7 mi ?[facility] ?[place], [place] [address] ?[phone] ?Overall rating ?Much below average ?37. ?23.3 mi ?[facility] ?[address] ?[place], [place] [address] ?[phone] ?Overall rating ?Above average ?38. ?23.5 mi ?[place] ?[address] ?[place], [place] [address] ?[phone] ?Overall rating ?Not  available18 ?39. ?24.1 mi ?[facility] ?[address] ?[place], [place] [address] ?[phone] ?Overall rating ?Below average ?40. ?24.2 mi ?[place] ?[address] ?[place], [place] [address] ?[phone] ?Overall rating ?Below average ?41. ?24.4 mi ?[facility] ?[place] ?[place], [place] [address] ?[phone] ?Overall rating ?Much below average ?42. ?24.5 mi ?[place] ?[address] ?[place],  [address] ?([phone] ?Overall rating ?Above average ?[facility] to [facility]. Await bed choice. ? ?Expected Discharge Plan: [place] ?Barriers to Discharge: Continued Medical Work up ? ?Expected Discharge Plan and Services ?Expected Discharge Plan: [place] ?  ?Discharge Planning Services: CM Consult ?Post Acute Care Choice: Durable Medical Equipment, Home Health ?Living arrangements for the past 2 months: [place] ?Expected Discharge Date: 03/15/22               ?  ?  ?  ?  ?  ?  ?  ?  ?  ?  ? ? ?Social Determinants of Health (SDOH) Interventions ?  ? ?Readmission Risk Interventions ? ?  01/28/2022  ? 10:17 AM  ?Readmission Risk Prevention Plan  ?Transportation Screening Complete  ?PCP or Specialist Appt within 5-7 Days Complete  ?Home Care Screening Complete  ?Medication Review (RN CM) Complete  ? ? ?

## 2022-03-16 DIAGNOSIS — I480 Paroxysmal atrial fibrillation: Secondary | ICD-10-CM | POA: Diagnosis not present

## 2022-03-16 DIAGNOSIS — N179 Acute kidney failure, unspecified: Secondary | ICD-10-CM

## 2022-03-16 DIAGNOSIS — R600 Localized edema: Secondary | ICD-10-CM

## 2022-03-16 DIAGNOSIS — Z9989 Dependence on other enabling machines and devices: Secondary | ICD-10-CM | POA: Diagnosis not present

## 2022-03-16 DIAGNOSIS — G4733 Obstructive sleep apnea (adult) (pediatric): Secondary | ICD-10-CM | POA: Diagnosis not present

## 2022-03-16 DIAGNOSIS — E162 Hypoglycemia, unspecified: Secondary | ICD-10-CM | POA: Diagnosis not present

## 2022-03-16 DIAGNOSIS — M109 Gout, unspecified: Secondary | ICD-10-CM

## 2022-03-16 DIAGNOSIS — F319 Bipolar disorder, unspecified: Secondary | ICD-10-CM

## 2022-03-16 LAB — GLUCOSE, CAPILLARY
Glucose-Capillary: 128 mg/dL — ABNORMAL HIGH (ref 70–99)
Glucose-Capillary: 172 mg/dL — ABNORMAL HIGH (ref 70–99)
Glucose-Capillary: 194 mg/dL — ABNORMAL HIGH (ref 70–99)
Glucose-Capillary: 199 mg/dL — ABNORMAL HIGH (ref 70–99)
Glucose-Capillary: 216 mg/dL — ABNORMAL HIGH (ref 70–99)
Glucose-Capillary: 238 mg/dL — ABNORMAL HIGH (ref 70–99)
Glucose-Capillary: 295 mg/dL — ABNORMAL HIGH (ref 70–99)

## 2022-03-16 MED ORDER — TORSEMIDE 20 MG PO TABS: 20.0000 mg | ORAL_TABLET | Freq: Every day | ORAL | Status: DC

## 2022-03-16 MED ORDER — QUETIAPINE FUMARATE 25 MG PO TABS: 12.5000 mg | ORAL_TABLET | Freq: Every day | ORAL | Status: DC

## 2022-03-16 NOTE — Discharge Summary (Signed)
Physician Discharge Summary  ?Christian [name] ZOX:[id number] DOB: 13-Dec-1950 DOA: 03/11/2022 ? ?PCP: [name], MD ? ?Admit date: 03/11/2022 ?Discharge date: 03/16/2022 ?Discharging to: SNF ?Recommendations for Outpatient Follow-up:  ?Demand CBG 3 times daily before meals and at bedtime ? ?Consults:  ?None ?Procedures:  ?None ? ? ?Discharge Diagnoses:  ? Principal Problem: ?  Hypoglycemia ?Active Problems: ?   ? ?  AKI (acute kidney injury) (HCC) ?  Chronic kidney disease, stage 3b (HCC) ?OSA on CPAP ?  Bilateral lower extremity edema: chronic with venous stasis changes ?  Gout ?  Bipolar and related disorder (HCC) ?  Paroxysmal atrial fibrillation (HCC) ?  Atrial flutter, paroxysmal (HCC) ? ? ?Hospital Course: ? Christian [name] is a 71 y.o. male with a history of PAD, bilateral lower extremity edema, bipolar disorder, chronic HFpEF, OSA on CPAP, T2DM, HTN, and gout who was sent to [facility] 4/13 for lethargy in the vascular surgery office, found to be severely hypoglycemic to 39mg /dl. Creatinine was noted to be elevated to 2.2 from a baseline of 1.6. Mental status has improved with dextrose infusion and subsequent oral intake. Hypoglycemic agents are held. The patient's level of debility requires SNF level of rehabilitation which is being pursued. His psychiatric status is stable.  ? ?Assessment and Plan: ? ?Type 2 diabetes mellitus with hypoglycemia ?-Hemoglobin A1c 7.6 ?- Patient states that he was eating normally on the day that he was noted to become hypoglycemic ?- He states that he took all of his medications that morning, however, he is not sure which medicines he takes for his diabetes ?- In the hospital, Lantus, glimepiride and empagliflozin have been on hold and his blood sugars have been running in the high 100s to 200s ?- Will resume empagliflozin but will continue to hold glipizide and Lantus for now ?- Would recommend CBGs checks regularly at the nursing facility and titration of medication as needed ? ?AKI  with CKD stage III, heart failure with preserved EF ?- The patient's creatinine was 2.27 on the date the patient was admitted to the hospital ?- Creatinine has improved to 1.53 with IV fluid hydration and with holding torsemide ?-Torsemide is being resumed at 20 mg daily today rather than twice daily ? ?Severely debilitated ?- The patient states that he was very weak at home and had fallen ?- He is being transitioned to a rehab facility today ? ?Acute metabolic encephalopathy secondary to hypoglycemia ?- Has resolved ? ?Elevated troponin ?- Troponin noted to be 28, 29, 27 and 31 ?- Possibly demand ischemia however, patient also has chronic kidney disease ? ?Paroxysmal atrial fibrillation ?Continue metoprolol and Xarelto ? ?Hypothyroidism ?- Continue Cytomel ? ?Bipolar disorder ?- Continue Seroquel (dose has been lowered to 12.5 from 50), Depakote and Abilify ? ?Left leg wound, history of PAD ?- Continue local wound care ? ?Obesity ?-Body mass index is 34.56 kg/m?. ? ? ? ? ?Discharge Instructions ? ?Discharge Instructions   ? ? Diet - low sodium heart healthy   Complete by: As directed ?  ? Diet Carb Modified   Complete by: As directed ?  ? Discharge wound care:   Complete by: As directed ?  ? Wound care to left LE chronic, full thickness wound:  Cleanse with NS, pat dry. Cover with size-appropriate piece of xeroform gauze # 294), top with dry gauze, ABD and secure with a few turns of Kerlix roll gauze/paper tape.  Change daily..  ? Increase activity slowly   Complete by:  As directed ?  ? ?  ? ?Allergies as of 03/16/2022   ? ?   Reactions  ? Amlodipine Swelling, Other (See Comments)  ? Legs became swollen  ? Onglyza [saxagliptin] Other (See Comments)  ? "Allergic," per [facility]  ? Zyprexa [olanzapine] Other (See Comments)  ? Allergy noted by ACT Team. No reaction specified (??)- "Allergic," per [facility]  ? Ritalin [methylphenidate] Anxiety  ? "[place]"  ? ?  ? ?  ?Medication List  ?   ? ?STOP taking these medications   ? ?glimepiride 2 MG tablet ?Commonly known as: AMARYL ?  ?insulin glargine 100 UNIT/ML injection ?Commonly known as: LANTUS ?  ? ?  ? ?TAKE these medications   ? ?acetaminophen 500 MG tablet ?Commonly known as: TYLENOL ?Take 500-1,000 mg by mouth every 6 (six) hours as needed (pain.). ?  ?ARIPiprazole 20 MG tablet ?Commonly known as: ABILIFY ?Take 1 tablet (20 mg total) by mouth daily. ?  ?aspirin EC 81 MG tablet ?Take 81 mg by mouth daily. Swallow whole. ?  ?atorvastatin 10 MG tablet ?Commonly known as: LIPITOR ?Take 1 tablet (10 mg total) by mouth daily at 6 PM. ?What changed: when to take this ?  ?buPROPion 75 MG tablet ?Commonly known as: WELLBUTRIN ?Take 1 tablet (75 mg total) by mouth daily. ?  ?divalproex 500 MG 24 hr tablet ?Commonly known as: DEPAKOTE ER ?Take 500 mg by mouth in the morning and at bedtime. ?  ?empagliflozin 10 MG Tabs tablet ?Commonly known as: JARDIANCE ?Take 10 mg by mouth daily. ?  ?liothyronine 5 MCG tablet ?Commonly known as: CYTOMEL ?Take 10 mcg by mouth daily. ?  ?metoprolol succinate 50 MG 24 hr tablet ?Commonly known as: TOPROL-XL ?Take 1 tablet (50 mg total) by mouth daily. Take with or immediately following a meal. ?  ?mirtazapine 15 MG tablet ?Commonly known as: REMERON ?Take 15 mg by mouth at bedtime. ?  ?polyethylene glycol powder 17 GM/SCOOP powder ?Commonly known as: GLYCOLAX/MIRALAX ?Take 17 g by mouth daily. ?What changed:  ?when to take this ?reasons to take this ?  ?QUEtiapine 25 MG tablet ?Commonly known as: SEROQUEL ?Take 0.5 tablets (12.5 mg total) by mouth at bedtime. ?What changed:  ?medication strength ?how much to take ?  ?rivaroxaban 20 MG Tabs tablet ?Commonly known as: XARELTO ?Take 1 tablet (20 mg total) by mouth daily with supper. ?  ?torsemide 20 MG tablet ?Commonly known as: DEMADEX ?Take 1 tablet (20 mg total) by mouth daily. ?What changed: when to take this ?  ? ?  ? ?  ?  ? ? ?  ?Discharge Care Instructions  ?(From  admission, onward)  ?  ? ? ?  ? ?  Start     Ordered  ? 03/16/22 0000  Discharge wound care:       ?Comments: Wound care to left LE chronic, full thickness wound:  Cleanse with NS, pat dry. Cover with size-appropriate piece of xeroform gauze [place] # 294), top with dry gauze, ABD and secure with a few turns of Kerlix roll gauze/paper tape.  Change daily..  ? 03/16/22 1304  ? ?  ?  ? ?  ? ? Contact information for after-discharge care   ? ? Destination   ? ? [facility] .   ?Service: Skilled Nursing ?Contact information: ?[address] ?[place] [address] ?[phone] ? ?  ?  ? ?  ?  ? ?  ?  ? ?  ? ?  ?  ?  The results of significant diagnostics from this hospitalization (including imaging, microbiology, ancillary and laboratory) are listed below for reference.   ? ?CT Head Wo Contrast ? ?Result Date: 03/11/2022 ?CLINICAL DATA:  Altered mental status. EXAM: CT HEAD WITHOUT CONTRAST TECHNIQUE: Contiguous axial images were obtained from the base of the skull through the vertex without intravenous contrast. RADIATION DOSE REDUCTION: This exam was performed according to the departmental dose-optimization program which includes automated exposure control, adjustment of the mA and/or kV according to patient size and/or use of iterative reconstruction technique. COMPARISON:  Apr 02, 2021 FINDINGS: Brain: There is mild cerebral atrophy with widening of the extra-axial spaces and ventricular dilatation. There are areas of decreased attenuation within the white matter tracts of the supratentorial brain, consistent with microvascular disease changes. Vascular: No hyperdense vessel or unexpected calcification. Skull: Normal. Negative for fracture or focal lesion. Sinuses/Orbits: No acute finding. Other: The study is limited secondary to patient motion. IMPRESSION: 1. Limited study secondary to patient motion. 2. No acute intracranial abnormality. Electronically Signed    By: Aram Candela M.D.   On: 03/11/2022 23:23  ? ?DG Chest Port 1 View ? ?Result Date: 03/11/2022 ?CLINICAL DATA:  Hypoxia and hypotension.  Bilateral leg edema. EXAM: PORTABLE CHEST 1 VIEW COMPARISON:  02/2

## 2022-03-16 NOTE — Progress Notes (Signed)
Occupational Therapy Treatment ?Patient Details ?Name: Christian [name] ?MRN: [id number] ?DOB: Apr 27, 1951 ?Today's Date: 03/16/2022 ? ? ?History of present illness 71 y.o. male with medical history significant for peripheral artery disease followed by vascular surgery, bilateral lower extremity lymphedema, bipolar disorder, chronic diastolic CHF, gout, hypertension, OSA on CPAP, type 2 diabetes, was sent to [facility] ED from his vascular surgeon's office due to lethargy. Dx of AKI, hypoglycemia, RLE wound. ?  ?OT comments ? Patient was set up with increased time to participate in self feeding tasks. Patient was able to engage in standing in STEDY with mod A X2 with shakiness noted in BLE. Patient would continue to benefit from skilled OT services at this time while admitted and after d/c to address noted deficits in order to improve overall safety and independence in ADLs.  ?  ? ?Recommendations for follow up therapy are one component of a multi-disciplinary discharge planning process, led by the attending physician.  Recommendations may be updated based on patient status, additional functional criteria and insurance authorization. ?   ?Follow Up Recommendations ? Skilled nursing-short term rehab (<3 hours/day)  ?  ?Assistance Recommended at Discharge Frequent or constant Supervision/Assistance  ?Patient can return home with the following ? Two people to help with walking and/or transfers;Two people to help with bathing/dressing/bathroom;Direct supervision/assist for medications management;Help with stairs or ramp for entrance;Assist for transportation;Direct supervision/assist for financial management;Assistance with cooking/housework ?  ?Equipment Recommendations ? Other (comment) (defer to next level)  ?  ?Recommendations for Other Services   ? ?  ?Precautions / Restrictions Precautions ?Precautions: Fall ?Precaution Comments: monitor HR ?Restrictions ?Weight Bearing Restrictions: No  ? ? ?  ? ?Mobility Bed Mobility ?   ?Bed Mobility: Supine to Sit ?  ?  ?Supine to sit: Max assist, +2 for safety/equipment, +2 for physical assistance, HOB elevated ?  ?  ?General bed mobility comments: patient  required multimodal cues to stay on taske,  to roll and reach., Patient did require increased assistance this visit to sit upright, assisted with legs back onto bed ?  ? ?Transfers ?  ?  ?  ?  ?  ?  ?  ?  ?  ?  ?  ?  ?Balance Overall balance assessment: Needs assistance, History of Falls ?Sitting-balance support: Feet supported, No upper extremity supported ?Sitting balance-Leahy Scale: Fair ?Sitting balance - Comments: head tilted to the right ?  ?Standing balance support: Bilateral upper extremity supported, Reliant on assistive device for balance ?Standing balance-Leahy Scale: Poor ?  ?  ?  ?  ?  ?  ?  ?  ?  ?  ?  ?  ?   ? ?ADL either performed or assessed with clinical judgement  ? ?ADL Overall ADL's : Needs assistance/impaired ?Eating/Feeding: Set up;Bed level ?Eating/Feeding Details (indicate cue type and reason): patient was able to pick up spoon and fork and bring items to mouth with no spillage noted. patient was noted to hold the utensil distally but no spillage noted. ?  ?  ?  ?  ?  ?  ?  ?  ?Lower Body Dressing: Total assistance;Bed level ?  ?  ?Toilet Transfer Details (indicate cue type and reason): patient was able to participate in standing trials with STEDY with increased cues and noted shakiness of BLE with increased time on feet.patietn stood in [place] x3 on this date. patient noted to have sight R side of neck with patient reporting it has been from lying in bed. patient was  educated on stretching neck to prevent stiffness. ?  ?  ?  ?  ?  ?  ?  ? ?Extremity/Trunk Assessment   ?  ?  ?  ?  ?  ? ?Vision   ?  ?  ?Perception   ?  ?Praxis   ?  ? ?Cognition Arousal/Alertness: Lethargic ?Behavior During Therapy: Flat affect ?Overall Cognitive Status: Difficult to assess ?  ?  ?  ?  ?  ?  ?  ?  ?  ?  ?  ?  ?  ?  ?  ?  ?General  Comments: patient was cooperative with participating in therapy. still noted to be lethargic with cues to keep eyes open ?  ?  ?   ?Exercises   ? ?  ?Shoulder Instructions   ? ? ?  ?General Comments    ? ? ?Pertinent Vitals/ Pain       Pain Assessment ?Pain Assessment: No/denies pain ? ?Home Living   ?  ?  ?  ?  ?  ?  ?  ?  ?  ?  ?  ?  ?  ?  ?  ?  ?  ?  ? ?  ?Prior Functioning/Environment    ?  ?  ?  ?   ? ?Frequency ? Min 2X/week  ? ? ? ? ?  ?Progress Toward Goals ? ?OT Goals(current goals can now be found in the care plan section) ? Progress towards OT goals: Progressing toward goals ? ?   ?Plan     ? ?Co-evaluation ? ? ?   ?Reason for Co-Treatment: For patient/therapist safety ?PT goals addressed during session: Mobility/safety with mobility ?OT goals addressed during session: ADL's and self-care ?  ? ?  ?AM-PAC OT "6 Clicks" Daily Activity     ?Outcome Measure ? ? Help from another person eating meals?: A Little ?Help from another person taking care of personal grooming?: A Little ?Help from another person toileting, which includes using toliet, bedpan, or urinal?: Total ?Help from another person bathing (including washing, rinsing, drying)?: A Lot ?Help from another person to put on and taking off regular upper body clothing?: A Lot ?Help from another person to put on and taking off regular lower body clothing?: Total ?6 Click Score: 12 ? ?  ?End of Session Equipment Utilized During Treatment: Gait belt;Rolling walker (2 wheels) ? ?OT Visit Diagnosis: Unsteadiness on feet (R26.81);History of falling (Z91.81);Muscle weakness (generalized) (M62.81) ?  ?Activity Tolerance Patient tolerated treatment well ?  ?Patient Left in bed;with call bell/phone within reach;with bed alarm set ?  ?Nurse Communication   ?  ? ?   ? ?Time: 7989-2119 ?OT Time Calculation (min): 26 min ? ?Charges: OT General Charges ?$OT Visit: 1 Visit ?OT Treatments ?$Therapeutic Activity: 8-22 mins ? ?Felica Chargois OTR/L, MS ?Acute Rehabilitation  Department ?Office# [phone] ?Pager# [phone] ? ? ?Barnabas Lister Dorothyann Mourer ?03/16/2022, 11:18 AM ?

## 2022-03-16 NOTE — Progress Notes (Addendum)
Physical Therapy Treatment ?Patient Details ?Name: Christian [name] ?MRN: [id number] ?DOB: May 05, 1951 ?Today's Date: 03/16/2022 ? ? ?History of Present Illness 71 y.o. male with medical history significant for peripheral artery disease followed by vascular surgery, bilateral lower extremity lymphedema, bipolar disorder, chronic diastolic CHF, gout, hypertension, OSA on CPAP, type 2 diabetes, was sent to [facility] ED from his vascular surgeon's office due to lethargy. Dx of AKI, hypoglycemia, RLE wound. ? ?  ?PT Comments  ? ? Patient is very drowsy, patient relates to a medication. Patient did require increased assistance to mobilize to sitting and to supine. ? Patient stood x 2 utilizing the [place] STEDY for safety. ? Patient   was min-mod assistance to  power up. To stand for 1-2 minutes. ? HR 104-124.   ?Patient demonstrates increased strength to stand, just dmore drowsy for participation today.  ?Recommendations for follow up therapy are one component of a multi-disciplinary discharge planning process, led by the attending physician.  Recommendations may be updated based on patient status, additional functional criteria and insurance authorization. ? ?Follow Up Recommendations ? Skilled nursing-short term rehab (<3 hours/day) ?  ?  ?Assistance Recommended at Discharge Frequent or constant Supervision/Assistance  ?Patient can return home with the following Two people to help with walking and/or transfers;A lot of help with bathing/dressing/bathroom;Assistance with cooking/housework;Assist for transportation;Help with stairs or ramp for entrance ?  ?Equipment Recommendations ? None recommended by PT  ?  ?Recommendations for Other Services   ? ? ?  ?Precautions / Restrictions Precautions ?Precautions: Fall ?Precaution Comments: monitor HR ?Restrictions ?Weight Bearing Restrictions: No  ?  ? ?Mobility ? Bed Mobility ?  ?Bed Mobility: Supine to Sit ?  ?  ?Supine to sit: Max assist, +2 for safety/equipment, +2 for physical  assistance, HOB elevated ?  ?  ?General bed mobility comments: patient  required multimodal cues to stay on taske,  to roll and reach., Patient did require increased assistance this visit to sit upright, assisted with legs back onto bed ?  ? ?Transfers ?Overall transfer level: Needs assistance ?Equipment used: Ambulation equipment used ?Transfers: Sit to/from Stand ?Sit to Stand: From elevated surface, Min assist, +2 safety/equipment, +2 physical assistance, Mod assist ?  ?  ?  ?  ?  ?General transfer comment: patient stood x 2 fat STEDY with mod then min assistance. Patien drowsy. Stood x ~ 2 minutes second trial. ?Transfer via Lift Equipment: Stedy ? ?Ambulation/Gait ?  ?  ?  ?  ?  ?  ?  ?  ? ? ?Stairs ?  ?  ?  ?  ?  ? ? ?Wheelchair Mobility ?  ? ?Modified Rankin (Stroke Patients Only) ?  ? ? ?  ?Balance Overall balance assessment: Needs assistance, History of Falls ?Sitting-balance support: Feet supported, No upper extremity supported ?Sitting balance-Leahy Scale: Fair ?Sitting balance - Comments: head tilted to the right ?  ?Standing balance support: Bilateral upper extremity supported, Reliant on assistive device for balance ?Standing balance-Leahy Scale: Poor ?  ?  ?  ?  ?  ?  ?  ?  ?  ?  ?  ?  ?  ? ?  ?Cognition Arousal/Alertness: Lethargic ?Behavior During Therapy: Flat affect ?  ?  ?  ?  ?  ?  ?  ?  ?  ?  ?  ?  ?  ?  ?  ?  ?  ?General Comments: patient was cooperative with participating in therapy. still noted to be lethargic with cues  to keep eyes open, tearful  about th  shoulder AROM limits ?  ?  ? ?  ?Exercises   ? ?  ?General Comments   ?  ?  ? ?Pertinent Vitals/Pain Pain Assessment ?Pain Assessment: Faces ?Faces Pain Scale: Hurts little more ?Pain Location: right  neck, head tilted to the right ?Pain Descriptors / Indicators: Aching, Discomfort, Tightness ?Pain Intervention(s): Monitored during session, Repositioned  ? ? ?Home Living   ?  ?  ?  ?  ?  ?  ?  ?  ?  ?   ?  ?Prior Function    ?  ?  ?    ? ?PT Goals (current goals can now be found in the care plan section) Progress towards PT goals: Progressing toward goals ? ?  ?Frequency ? ? ? Min 2X/week ? ? ? ?  ?PT Plan Current plan remains appropriate  ? ? ?Co-evaluation PT/OT/SLP Co-Evaluation/Treatment: Yes ?Reason for Co-Treatment: For patient/therapist safety ?PT goals addressed during session: Mobility/safety with mobility ?OT goals addressed during session: ADL's and self-care ?  ? ?  ?AM-PAC PT "6 Clicks" Mobility   ?Outcome Measure ? Help needed turning from your back to your side while in a flat bed without using bedrails?: A Lot ?Help needed moving from lying on your back to sitting on the side of a flat bed without using bedrails?: A Lot ?Help needed moving to and from a bed to a chair (including a wheelchair)?: Total ?Help needed standing up from a chair using your arms (e.g., wheelchair or bedside chair)?: Total ?Help needed to walk in hospital room?: Total ?Help needed climbing 3-5 steps with a railing? : Total ?6 Click Score: 8 ? ?  ?End of Session Equipment Utilized During Treatment: Gait belt ?Activity Tolerance: Patient limited by lethargy ?Patient left: in bed;with bed alarm set;with call bell/phone within reach ?Nurse Communication: Mobility status ?PT Visit Diagnosis: Unsteadiness on feet (R26.81);History of falling (Z91.81);Repeated falls (R29.6);Difficulty in walking, not elsewhere classified (R26.2) ?  ? ? ?Time: [id number] ?PT Time Calculation (min) (ACUTE ONLY): 22 min ? ?Charges:  $Therapeutic Activity: 8-22 mins          ?          ? ?Tresa Endo PT ?Acute Rehabilitation Services ?Pager [phone] ?Office [phone] ? ? ? ?Saydi Kobel, Shella Maxim ?03/16/2022, 11:00 AM ? ?

## 2022-03-16 NOTE — TOC Progression Note (Signed)
Transition of Care (TOC) - Progression Note  ? ? ?Patient Details  ?Name: Yuriy Cui Ezekiel ?MRN: [id number] ?Date of Birth: May 12, 1951 ? ?Transition of Care (TOC) CM/SW Contact  ?Lanier Clam, RN ?Phone Number: ?03/16/2022, 3:15 PM ? ?Clinical Narrative: [employer] rep [employer] still awaiting auth.   ? ? ? ?Expected Discharge Plan: Skilled Nursing Facility ?Barriers to Discharge: Insurance Authorization ? ?Expected Discharge Plan and Services ?Expected Discharge Plan: Skilled Nursing Facility ?  ?Discharge Planning Services: CM Consult ?Post Acute Care Choice: Durable Medical Equipment, Home Health ?Living arrangements for the past 2 months: Single Family Home ?Expected Discharge Date: 03/16/22               ?  ?  ?  ?  ?  ?  ?  ?  ?  ?  ? ? ?Social Determinants of Health (SDOH) Interventions ?  ? ?Readmission Risk Interventions ? ?  01/28/2022  ? 10:17 AM  ?Readmission Risk Prevention Plan  ?Transportation Screening Complete  ?PCP or Specialist Appt within 5-7 Days Complete  ?Home Care Screening Complete  ?Medication Review (RN CM) Complete  ? ? ?

## 2022-03-17 ENCOUNTER — Observation Stay (HOSPITAL_COMMUNITY): Payer: Medicare HMO

## 2022-03-17 DIAGNOSIS — E162 Hypoglycemia, unspecified: Secondary | ICD-10-CM | POA: Diagnosis not present

## 2022-03-17 DIAGNOSIS — R509 Fever, unspecified: Secondary | ICD-10-CM | POA: Diagnosis not present

## 2022-03-17 DIAGNOSIS — J9811 Atelectasis: Secondary | ICD-10-CM | POA: Diagnosis not present

## 2022-03-17 LAB — CBC
HCT: 47.8 % (ref 39.0–52.0)
Hemoglobin: 14.7 g/dL (ref 13.0–17.0)
MCH: 25.9 pg — ABNORMAL LOW (ref 26.0–34.0)
MCHC: 30.8 g/dL (ref 30.0–36.0)
MCV: 84.2 fL (ref 80.0–100.0)
Platelets: 168 10*3/uL (ref 150–400)
RBC: 5.68 MIL/uL (ref 4.22–5.81)
RDW: 18 % — ABNORMAL HIGH (ref 11.5–15.5)
WBC: 4.6 10*3/uL (ref 4.0–10.5)
nRBC: 0 % (ref 0.0–0.2)

## 2022-03-17 LAB — URINALYSIS, ROUTINE W REFLEX MICROSCOPIC
Bacteria, UA: NONE SEEN
Bilirubin Urine: NEGATIVE
Glucose, UA: >=500 mg/dL — AB
Hgb urine dipstick: NEGATIVE
Ketones, ur: NEGATIVE mg/dL
Leukocytes,Ua: NEGATIVE
Nitrite: NEGATIVE
Protein, ur: NEGATIVE mg/dL
Specific Gravity, Urine: 1.016 (ref 1.005–1.030)
pH: 5 (ref 5.0–8.0)

## 2022-03-17 LAB — BASIC METABOLIC PANEL
Anion gap: 8 (ref 5–15)
BUN: 31 mg/dL — ABNORMAL HIGH (ref 8–23)
CO2: 24 mmol/L (ref 22–32)
Calcium: 9.2 mg/dL (ref 8.9–10.3)
Chloride: 98 mmol/L (ref 98–111)
Creatinine, Ser: 1.49 mg/dL — ABNORMAL HIGH (ref 0.61–1.24)
GFR, Estimated: 51 mL/min — ABNORMAL LOW (ref >60)
Glucose, Bld: 274 mg/dL — ABNORMAL HIGH (ref 70–99)
Potassium: 4.6 mmol/L (ref 3.5–5.1)
Sodium: 130 mmol/L — ABNORMAL LOW (ref 135–145)

## 2022-03-17 LAB — GLUCOSE, CAPILLARY
Glucose-Capillary: 189 mg/dL — ABNORMAL HIGH (ref 70–99)
Glucose-Capillary: 149 mg/dL — ABNORMAL HIGH (ref 70–99)
Glucose-Capillary: 213 mg/dL — ABNORMAL HIGH (ref 70–99)
Glucose-Capillary: 226 mg/dL — ABNORMAL HIGH (ref 70–99)
Glucose-Capillary: 319 mg/dL — ABNORMAL HIGH (ref 70–99)
Glucose-Capillary: 272 mg/dL — ABNORMAL HIGH (ref 70–99)

## 2022-03-17 MED ORDER — LACTATED RINGERS IV BOLUS
500.0000 mL | Freq: Once | INTRAVENOUS | Status: AC
  Administered 2022-03-17: 500 mL via INTRAVENOUS

## 2022-03-17 MED ORDER — CEPHALEXIN 500 MG PO CAPS
500.0000 mg | ORAL_CAPSULE | Freq: Three times a day (TID) | ORAL | Status: DC
  Administered 2022-03-17 – 2022-03-19 (×6): 500 mg via ORAL
  Filled 2022-03-17 (×6): qty 1

## 2022-03-17 MED ORDER — METOPROLOL SUCCINATE ER 50 MG PO TB24
50.0000 mg | ORAL_TABLET | Freq: Every day | ORAL | Status: DC
  Administered 2022-03-17 – 2022-03-19 (×3): 50 mg via ORAL
  Filled 2022-03-17 (×3): qty 1

## 2022-03-17 NOTE — Progress Notes (Addendum)
?PROGRESS NOTE ? ? ? ?Christian [name]  [id number] DOB: [birth year] DOA: 03/11/2022 ?PCP: Donald Prose, MD  ? ?Brief Narrative: ?71 year old with past medical history significant for PAD, bilateral lower extremity edema, bipolar disorder, chronic heart failure preserved ejection fraction, OSA on CPAP, diabetes type 2, hypertension, gout who was sent to [facility] long ED 4/13 for lethargy in the vascular surgery office, patient found to be severely hypoglycemic with a CBG of 39.  Creatinine noted to be elevated at 2.2 from baseline 1.6.  Admitted with AKI.  Mental status has improved with dextrose infusion and subsequent oral intake.  Hypoglycemic agents were held. ?Patient's level debility require skilled nursing facility care.  Psychiatric status stable. ? ?Patient supposed to be discharged 4//19 when he was noted to have low-grade fever 100.9.  Plan to proceed with work-up and for observation ? ? ?Assessment & Plan: ?  ?Principal Problem: ?  Hypoglycemia ?Active Problems: ?  OSA on CPAP ?  Bilateral lower extremity edema: chronic with venous stasis changes ?  Gout ?  Bipolar and related disorder ([place]) ?  Paroxysmal atrial fibrillation (HCC) ?  Atrial flutter, paroxysmal ([place]) ?  AKI (acute kidney injury) ([place]) ?  Chronic kidney disease, stage 3b ([place]) ? ? ?1-Diabetes type 2 with hypoglycemia: ?Hemoglobin A1c 7.6 ?Patient stated that he was eating normally on the day that he is noted to become hypoglycemic.  He took all the medication that morning. ?-Currently Lantus, glimepiride and empagliflozin has been on hold. ?-no further  hypoglycemia.  Plan to resume empagliflozin at discharge. ? ?2-AKI on CKD stage IIIb, chronic  heart failure preserved ejection fraction compensated ?Creatinine on admission 2.2.  Creatinine has improved and down to 1.5. ?Torsemide has been resumed ? ?Fever:  ?Patient spike fever 4/19. ?Chest x ray negative. Blood culture order.  ?Will start Keflex to cover for cellulitis LE.  ?UA pending.   ? ?Hyponatremia; monitor.  ? ?Severely debilitated: He will need rehab ? ?Acute metabolic encephalopathy: Secondary to hypoglycemia resolved ?Mild elevation of troponin suspect demand ischemia in the setting of CKD as well ?Paroxysmal  A-fib: Continue with metoprolol and Xarelto ?Left leg wound, history of PAD: Continue with local wound care. ? ? ?Estimated body mass index is 33.19 kg/m? as calculated from the following: ?  Height as of this encounter: 6' (1.829 m). ?  Weight as of this encounter: 111 kg. ? ? ?DVT prophylaxis: Xarelto  ?Code Status: full code ?Family Communication: care discussed with patient.  ?Disposition Plan:  ?Status is: Observation ?The patient remains OBS appropriate and will d/c before 2 midnights. ? ? ? ?Consultants:  ?None ? ?Procedures:  ?None ? ?Antimicrobials:  ?None ? ?Subjective: ?He report sporadic cough, denies dysuria.  ?Patient had Fever 100.9 this am.  ? ? ?Objective: ?Vitals:  ? 03/17/22 0756 03/17/22 0757 03/17/22 1019 03/17/22 1333  ?BP: (!) 167/103  (!) 148/90 106/72  ?Pulse: (!) 101 100 (!) 120 96  ?Resp: 18   18  ?Temp: (!) 100.9 ?F (38.3 ?C)  98.5 ?F (36.9 ?C) 97.8 ?F (36.6 ?C)  ?TempSrc: Oral   Oral  ?SpO2: 100%     ?Weight:      ?Height:      ? ? ?Intake/Output Summary (Last 24 hours) at 03/17/2022 1537 ?Last data filed at 03/17/2022 1300 ?Gross per 24 hour  ?Intake 460 ml  ?Output 1800 ml  ?Net -1340 ml  ? ?Filed Weights  ? 03/15/22 0418 03/16/22 0500 03/17/22 0457  ?Weight: 115.6 kg 115.6  kg 111 kg  ? ? ?Examination: ? ?General exam: Appears calm and comfortable  ?Respiratory system: Clear to auscultation. Respiratory effort normal. ?Cardiovascular system: S1 & S2 heard, RRR. No JVD, murmurs, rubs, gallops or clicks. No pedal edema. ?Gastrointestinal system: Abdomen is nondistended, soft and nontender. No organomegaly or masses felt. Normal bowel sounds heard. ?Central nervous system: Alert and oriented. No focal neurological deficits. ?Extremities: Symmetric 5 x 5  power.left LE with dressing, no redness. Open wound, blister.  ? ? ?Data Reviewed: I have personally reviewed following labs and imaging studies ? ?CBC: ?Recent Labs  ?Lab 03/12/22 ?0223 03/12/22 ?0355 03/13/22 ?[id number] 03/14/22 ?1151 03/17/22 ?0936  ?WBC 3.4* 3.4* 3.7* 3.6* 4.6  ?HGB 12.5* 12.7* 13.2 13.2 14.7  ?HCT 40.3 42.9 43.2 42.7 47.8  ?MCV 84.7 87.0 84.2 84.4 84.2  ?PLT 149* 125* 159 147* 168  ? ?Basic Metabolic Panel: ?Recent Labs  ?Lab 03/11/22 ?2247 03/12/22 ?0355 03/13/22 ?[id number] 03/14/22 ?1151 03/17/22 ?0936  ?NA 134* 138 136 135 130*  ?K 4.6 4.6 5.1 5.0 4.6  ?CL 98 104 106 106 98  ?CO2 28 24 25 24 24   ?GLUCOSE 144* 92 128* 228* 274*  ?BUN 52* 52* 35* 23 31*  ?CREATININE 2.27* 2.08* 1.51* 1.53* 1.49*  ?CALCIUM 9.0 8.9 9.4 9.0 9.2  ?MG  --  2.2  --   --   --   ?PHOS  --  3.6  --   --   --   ? ?GFR: ?Estimated Creatinine Clearance: 61.1 mL/min (A) (by C-G formula based on SCr of 1.49 mg/dL (H)). ?Liver Function Tests: ?Recent Labs  ?Lab 03/11/22 ?2247  ?AST 29  ?ALT 34  ?ALKPHOS 62  ?BILITOT 0.4  ?PROT 7.5  ?ALBUMIN 3.4*  ? ?No results for input(s): LIPASE, AMYLASE in the last 168 hours. ?Recent Labs  ?Lab 03/11/22 ?2247 03/14/22 ?1145  ?AMMONIA 13 24  ? ?Coagulation Profile: ?No results for input(s): INR, PROTIME in the last 168 hours. ?Cardiac Enzymes: ?No results for input(s): CKTOTAL, CKMB, CKMBINDEX, TROPONINI in the last 168 hours. ?BNP (last 3 results) ?No results for input(s): PROBNP in the last 8760 hours. ?HbA1C: ?No results for input(s): HGBA1C in the last 72 hours. ?CBG: ?Recent Labs  ?Lab 03/16/22 ?2015 03/16/22 ?2348 03/17/22 ?[id number] 03/17/22 ?0734 03/17/22 ?1200  ?GLUCAP 238* 194* 189* 149* 213*  ? ?Lipid Profile: ?No results for input(s): CHOL, HDL, LDLCALC, TRIG, CHOLHDL, LDLDIRECT in the last 72 hours. ?Thyroid Function Tests: ?No results for input(s): TSH, T4TOTAL, FREET4, T3FREE, THYROIDAB in the last 72 hours. ?Anemia Panel: ?No results for input(s): VITAMINB12, FOLATE, FERRITIN, TIBC, IRON,  RETICCTPCT in the last 72 hours. ?Sepsis Labs: ?Recent Labs  ?Lab 03/11/22 ?2247  ?LATICACIDVEN 1.7  ? ? ?No results found for this or any previous visit (from the past 240 hour(s)).  ? ? ? ? ? ?Radiology Studies: ?DG CHEST PORT 1 VIEW ? ?Result Date: 03/17/2022 ?CLINICAL DATA:  Fever. EXAM: PORTABLE CHEST 1 VIEW COMPARISON:  Chest x-ray 03/11/2022 FINDINGS: The cardiac silhouette, mediastinal and hilar contours are within normal limits. Streaky areas of discoid atelectasis but no infiltrates or effusions. No pulmonary lesions. The bony thorax is intact. IMPRESSION: Streaky areas of discoid atelectasis.  No infiltrates or effusions. Electronically Signed   By: Marijo Sanes M.D.   On: 03/17/2022 10:09   ? ? ? ? ? ?Scheduled Meds: ? ARIPiprazole  20 mg Oral Daily  ? aspirin EC  81 mg Oral Daily  ? atorvastatin  10 mg Oral  [id number]  ? buPROPion  75 mg Oral Daily  ? divalproex  500 mg Oral Daily  ? liothyronine  10 mcg Oral Daily  ? metoprolol succinate  50 mg Oral Daily  ? mirtazapine  15 mg Oral QHS  ? QUEtiapine  12.5 mg Oral QHS  ? rivaroxaban  20 mg Oral Q supper  ? ?Continuous Infusions: ? ? LOS: 0 days  ? ? ?Time spent: 35 minutes ? ? ? ?Elmarie Shiley, MD ?[facility] ? ? ?If 7PM-7AM, please contact night-coverage ?[email or web address] ? ?03/17/2022, 3:37 PM   ?

## 2022-03-17 NOTE — TOC Progression Note (Signed)
Transition of Care (TOC) - Progression Note  ? ? ?Patient Details  ?Name: Christian [name] ?MRN: [id number] ?Date of Birth: 1951/06/24 ? ?Transition of Care (TOC) CM/SW Contact  ?Lanier Clam, RN ?Phone Number: ?03/17/2022, 10:10 AM ? ?Clinical Narrative: Noted not medically for d/c. Informed [facility] rep Revonda Standard who is awaiting auth for SNF.   ? ? ? ?Expected Discharge Plan: Skilled Nursing Facility ?Barriers to Discharge: Insurance Authorization ? ?Expected Discharge Plan and Services ?Expected Discharge Plan: Skilled Nursing Facility ?  ?Discharge Planning Services: CM Consult ?Post Acute Care Choice: Durable Medical Equipment, Home Health ?Living arrangements for the past 2 months: Single Family Home ?Expected Discharge Date: 03/16/22               ?  ?  ?  ?  ?  ?  ?  ?  ?  ?  ? ? ?Social Determinants of Health (SDOH) Interventions ?  ? ?Readmission Risk Interventions ? ?  01/28/2022  ? 10:17 AM  ?Readmission Risk Prevention Plan  ?Transportation Screening Complete  ?PCP or Specialist Appt within 5-7 Days Complete  ?Home Care Screening Complete  ?Medication Review (RN CM) Complete  ? ? ?

## 2022-03-18 ENCOUNTER — Observation Stay (HOSPITAL_COMMUNITY)
Admit: 2022-03-18 | Discharge: 2022-03-18 | Disposition: A | Discharge status: Home / Self Care | Payer: Medicare HMO | Attending: Internal Medicine | Admitting: Internal Medicine

## 2022-03-18 DIAGNOSIS — R4182 Altered mental status, unspecified: Secondary | ICD-10-CM | POA: Diagnosis not present

## 2022-03-18 DIAGNOSIS — E162 Hypoglycemia, unspecified: Secondary | ICD-10-CM | POA: Diagnosis not present

## 2022-03-18 LAB — GLUCOSE, CAPILLARY
Glucose-Capillary: 225 mg/dL — ABNORMAL HIGH (ref 70–99)
Glucose-Capillary: 241 mg/dL — ABNORMAL HIGH (ref 70–99)
Glucose-Capillary: 245 mg/dL — ABNORMAL HIGH (ref 70–99)
Glucose-Capillary: 303 mg/dL — ABNORMAL HIGH (ref 70–99)
Glucose-Capillary: 322 mg/dL — ABNORMAL HIGH (ref 70–99)
Glucose-Capillary: 467 mg/dL — ABNORMAL HIGH (ref 70–99)

## 2022-03-18 LAB — BASIC METABOLIC PANEL
Anion gap: 7 (ref 5–15)
BUN: 35 mg/dL — ABNORMAL HIGH (ref 8–23)
CO2: 25 mmol/L (ref 22–32)
Calcium: 9.3 mg/dL (ref 8.9–10.3)
Chloride: 98 mmol/L (ref 98–111)
Creatinine, Ser: 1.4 mg/dL — ABNORMAL HIGH (ref 0.61–1.24)
GFR, Estimated: 55 mL/min — ABNORMAL LOW (ref >60)
Glucose, Bld: 322 mg/dL — ABNORMAL HIGH (ref 70–99)
Potassium: 4.8 mmol/L (ref 3.5–5.1)
Sodium: 130 mmol/L — ABNORMAL LOW (ref 135–145)

## 2022-03-18 LAB — CBC
HCT: 45.6 % (ref 39.0–52.0)
Hemoglobin: 14.2 g/dL (ref 13.0–17.0)
MCH: 26 pg (ref 26.0–34.0)
MCHC: 31.1 g/dL (ref 30.0–36.0)
MCV: 83.5 fL (ref 80.0–100.0)
Platelets: 181 10*3/uL (ref 150–400)
RBC: 5.46 MIL/uL (ref 4.22–5.81)
RDW: 17.2 % — ABNORMAL HIGH (ref 11.5–15.5)
WBC: 4.8 10*3/uL (ref 4.0–10.5)
nRBC: 0 % (ref 0.0–0.2)

## 2022-03-18 MED ORDER — INSULIN ASPART 100 UNIT/ML IJ SOLN
0.0000 [IU] | Freq: Three times a day (TID) | INTRAMUSCULAR | Status: DC
  Administered 2022-03-18: 7 [IU] via SUBCUTANEOUS
  Administered 2022-03-18: 9 [IU] via SUBCUTANEOUS
  Administered 2022-03-18: 3 [IU] via SUBCUTANEOUS

## 2022-03-18 MED ORDER — EMPAGLIFLOZIN 10 MG PO TABS
10.0000 mg | ORAL_TABLET | Freq: Every day | ORAL | Status: DC
  Administered 2022-03-18 – 2022-03-19 (×2): 10 mg via ORAL
  Filled 2022-03-18 (×3): qty 1

## 2022-03-18 NOTE — Significant Event (Signed)
Rapid Response Event Note  ? ?Reason for Call :  ?Patient reported as lethargic, slow to respond, and not following commands. ? ?Initial Focused Assessment:  ?Found patient as described except for startle reaction when hearing his name. Patient not answering orientation questions or following commands. Lung sounds diminished. Observed patient with lower leg dressing and on treatment for cellulitis. CBG is normal per bedside staff. Vitals also within expected limits. While reviewing chart, bedside RN and unit charge nurse assessed rectal temperature as infection has been a concern. Temperature was 99.8 F. ? ?Interventions:  ?Physical assessment and chart reviewed. As part of assessment, rectal temperature obtained. When this occurred, patient awoke and was fully verbal with appropriate displeasure of procedure. After temperature obtained, patient followed all commands and able to swallow water and meds without difficulty. ?Advised to keep room temperature cool to aide in fever escalation. ? ?Plan of Care:  ?Patient to remain in current assigned bed. Staff will notify RR RN if any changes to condition. No new orders required to maintain patient. ? ? ?Event Summary:  ? ?MD Notified:  [id number] Chinita Greenland, NP - No new orders or interventions needed. ?Call Time: 2209 ?Arrival Time: 2211 ?End Time: 2223 ? ?Lamona Curl, RN ?

## 2022-03-18 NOTE — TOC Progression Note (Signed)
Transition of Care (TOC) - Progression Note  ? ? ?Patient Details  ?Name: Tri Chittick Ingber ?MRN: [id number] ?Date of Birth: December 13, 1950 ? ?Transition of Care (TOC) CM/SW Contact  ?Lanier Clam, RN ?Phone Number: ?03/18/2022, 3:29 PM ? ?Clinical Narrative:  [facility] rep Revonda Standard has auth-awaiting medical stability.  ? ? ? ?Expected Discharge Plan: Skilled Nursing Facility ?Barriers to Discharge: Continued Medical Work up ? ?Expected Discharge Plan and Services ?Expected Discharge Plan: Skilled Nursing Facility ?  ?Discharge Planning Services: CM Consult ?Post Acute Care Choice: Durable Medical Equipment, Home Health ?Living arrangements for the past 2 months: Single Family Home ?Expected Discharge Date: 03/16/22               ?  ?  ?  ?  ?  ?  ?  ?  ?  ?  ? ? ?Social Determinants of Health (SDOH) Interventions ?  ? ?Readmission Risk Interventions ? ?  01/28/2022  ? 10:17 AM  ?Readmission Risk Prevention Plan  ?Transportation Screening Complete  ?PCP or Specialist Appt within 5-7 Days Complete  ?Home Care Screening Complete  ?Medication Review (RN CM) Complete  ? ? ?

## 2022-03-18 NOTE — Procedures (Signed)
Patient Name: Christian [name]  ?MRN: [id number]  ?Epilepsy Attending: Lora Havens  ?Referring Physician/Provider: Elmarie Shiley, MD ?Date: 03/18/2022 ?Duration: 24.14 mins ? ?Patient history: 71yo M with ams. EEG to evaluate for seizure. ? ?Level of alertness: Awake, asleep ? ?AEDs during EEG study: VPA ? ?Technical aspects: This EEG study was done with scalp electrodes positioned according to the 10-20 International system of electrode placement. Electrical activity was acquired at a sampling rate of 500Hz  and reviewed with a high frequency filter of 70Hz  and a low frequency filter of 1Hz . EEG data were recorded continuously and digitally stored.  ? ?Description: No clear posterior dominant rhythm was seen. Sleep was characterized by vertex waves, sleep spindles (12 to 14 Hz), maximal frontocentral region. EEG showed continuous generalized  3 to 6 Hz theta-delta slowing. Hyperventilation and photic stimulation were not performed.    ? ?Of note, EEG was technically difficult due to significant myogenic artifact.  ? ?ABNORMALITY ?- Continuous slow, generalized ? ?IMPRESSION: ?This technically difficult study is suggestive of moderate diffuse encephalopathy, nonspecific etiology. No seizures or epileptiform discharges were seen throughout the recording. ? ?Lora Havens  ? ?

## 2022-03-18 NOTE — Progress Notes (Signed)
?PROGRESS NOTE ? ? ? ?Christian [name]  [id number] DOB: 1951/04/30 DOA: 03/11/2022 ?PCP: Donald Prose, MD  ? ?Brief Narrative: ?71 year old with past medical history significant for PAD, bilateral lower extremity edema, bipolar disorder, chronic heart failure preserved ejection fraction, OSA on CPAP, diabetes type 2, hypertension, gout who was sent to [facility] long ED 4/13 for lethargy in the vascular surgery office, patient found to be severely hypoglycemic with a CBG of 39.  Creatinine noted to be elevated at 2.2 from baseline 1.6.  Admitted with AKI.  Mental status has improved with dextrose infusion and subsequent oral intake.  Hypoglycemic agents were held. ?Patient's level debility require skilled nursing facility care.  Psychiatric status stable. ? ?Patient supposed to be discharged 4//19 when he was noted to have low-grade fever 100.9.  Plan to proceed with work-up and for observation ? ?Patient had episode of AMS last night. He was lethargic, slow to respond. Subsequently he wake up.  ? ?Assessment & Plan: ?  ?Principal Problem: ?  Hypoglycemia ?Active Problems: ?  OSA on CPAP ?  Bilateral lower extremity edema: chronic with venous stasis changes ?  Gout ?  Bipolar and related disorder ([place]) ?  Paroxysmal atrial fibrillation (H[facility]) ?  Atrial flutter, paroxysmal ([place]) ?  AKI (acute kidney injury) ([place]) ?  Chronic kidney disease, stage 3b ([place]) ? ? ?1-Diabetes type 2 with hypoglycemia: ?Hemoglobin A1c 7.6 ?Patient stated that he was eating normally on the day that he is noted to become hypoglycemic.  He took all the medication that morning. ?-Currently  Lantus, glimepiride and empagliflozin has been on hold. ?-No further  hypoglycemia.  Plan to resume empagliflozin at discharge. ?CBG increasing. Change diet to carb modified. Resume empagliflozin and SSI.  ?If CBG remain elevated will resume lantus.  ? ?2-AKI on CKD stage IIIb, chronic  heart failure preserved ejection fraction compensated ?Creatinine on  admission 2.2.  Creatinine has improved and down to 1.5. ?Torsemide has been resumed ? ?Acute metabolic encephalopathy: Secondary to hypoglycemia initially.  ?Had an episode last night 4/20 were he was lethargic, slow to respond, not following command. CBG was normal at the time. Vitals stable. Patient wake up after rectal tempeture was checked.  ?-Plan to monitor for 24 hours in the hospital.  ?-check EEG. Electrolytes.  ?-neuro exam non foal.  ? ?Fever:  ?Patient spike fever 4/19. ?Chest x ray negative. Blood culture no growth to date.  ?Started on Keflex to cover for cellulitis LE.  ?UA negative ? ?Hyponatremia; monitor.  ? ?Severely debilitated: He will need rehab ? ? ?Mild elevation of troponin suspect demand ischemia in the setting of CKD as well ?Paroxysmal  A-fib: Continue with metoprolol and Xarelto ?Left leg wound, history of PAD: Continue with local wound care. ? ? ?Estimated body mass index is 33.19 kg/m? as calculated from the following: ?  Height as of this encounter: 6' (1.829 m). ?  Weight as of this encounter: 111 kg. ? ? ?DVT prophylaxis: Xarelto  ?Code Status: full code ?Family Communication: care discussed with patient.  ?Disposition Plan:  ?Status is: Observation ?The patient remains OBS appropriate and will d/c before 2 midnights. ? ? ? ?Consultants:  ?None ? ?Procedures:  ?None ? ?Antimicrobials:  ?None ? ?Subjective: ?Events from last night notice. Patient had episode of AMS. He is alert and conversant. He is upset because they check his tempeture per rectum last night.  ? ? ? ?Objective: ?Vitals:  ? 03/17/22 2219 03/18/22 0000 03/18/22 0409 03/18/22 0841  ?  BP:   123/77 138/84  ?Pulse:   91 91  ?Resp: (!) 36 18 18   ?Temp: 99.8 ?F (37.7 ?C)  98.6 ?F (37 ?C)   ?TempSrc: Rectal  Oral   ?SpO2:   99%   ?Weight:      ?Height:      ? ? ?Intake/Output Summary (Last 24 hours) at 03/18/2022 0857 ?Last data filed at 03/18/2022 0850 ?Gross per 24 hour  ?Intake 700 ml  ?Output 2450 ml  ?Net -1750 ml   ? ? ?Filed Weights  ? 03/15/22 0418 03/16/22 0500 03/17/22 0457  ?Weight: 115.6 kg 115.6 kg 111 kg  ? ? ?Examination: ? ?General exam: NAD ?Respiratory system: CTA ?Cardiovascular system: S 1, S 2 RRR ?Gastrointestinal system: BS present, soft, nt. ?Central nervous system: non focal.  ? ?Data Reviewed: I have personally reviewed following labs and imaging studies ? ?CBC: ?Recent Labs  ?Lab 03/12/22 ?0223 03/12/22 ?0355 03/13/22 ?[id number] 03/14/22 ?1151 03/17/22 ?0936  ?WBC 3.4* 3.4* 3.7* 3.6* 4.6  ?HGB 12.5* 12.7* 13.2 13.2 14.7  ?HCT 40.3 42.9 43.2 42.7 47.8  ?MCV 84.7 87.0 84.2 84.4 84.2  ?PLT 149* 125* 159 147* 168  ? ? ?Basic Metabolic Panel: ?Recent Labs  ?Lab 03/11/22 ?2247 03/12/22 ?0355 03/13/22 ?[id number] 03/14/22 ?1151 03/17/22 ?0936  ?NA 134* 138 136 135 130*  ?K 4.6 4.6 5.1 5.0 4.6  ?CL 98 104 106 106 98  ?CO2 28 24 25 24 24   ?GLUCOSE 144* 92 128* 228* 274*  ?BUN 52* 52* 35* 23 31*  ?CREATININE 2.27* 2.08* 1.51* 1.53* 1.49*  ?CALCIUM 9.0 8.9 9.4 9.0 9.2  ?MG  --  2.2  --   --   --   ?PHOS  --  3.6  --   --   --   ? ? ?GFR: ?Estimated Creatinine Clearance: 61.1 mL/min (A) (by C-G formula based on SCr of 1.49 mg/dL (H)). ?Liver Function Tests: ?Recent Labs  ?Lab 03/11/22 ?2247  ?AST 29  ?ALT 34  ?ALKPHOS 62  ?BILITOT 0.4  ?PROT 7.5  ?ALBUMIN 3.4*  ? ? ?No results for input(s): LIPASE, AMYLASE in the last 168 hours. ?Recent Labs  ?Lab 03/11/22 ?2247 03/14/22 ?1145  ?AMMONIA 13 24  ? ? ?Coagulation Profile: ?No results for input(s): INR, PROTIME in the last 168 hours. ?Cardiac Enzymes: ?No results for input(s): CKTOTAL, CKMB, CKMBINDEX, TROPONINI in the last 168 hours. ?BNP (last 3 results) ?No results for input(s): PROBNP in the last 8760 hours. ?HbA1C: ?No results for input(s): HGBA1C in the last 72 hours. ?CBG: ?Recent Labs  ?Lab 03/17/22 ?2009 03/17/22 ?2153 03/18/22 ?0010 03/18/22 ?0405 03/18/22 ?[id number]  ?GLUCAP 319* 272* 303* 241* 225*  ? ? ?Lipid Profile: ?No results for input(s): CHOL, HDL, LDLCALC, TRIG,  CHOLHDL, LDLDIRECT in the last 72 hours. ?Thyroid Function Tests: ?No results for input(s): TSH, T4TOTAL, FREET4, T3FREE, THYROIDAB in the last 72 hours. ?Anemia Panel: ?No results for input(s): VITAMINB12, FOLATE, FERRITIN, TIBC, IRON, RETICCTPCT in the last 72 hours. ?Sepsis Labs: ?Recent Labs  ?Lab 03/11/22 ?2247  ?LATICACIDVEN 1.7  ? ? ? ?Recent Results (from the past 240 hour(s))  ?Culture, blood (Routine X 2) w Reflex to ID Panel     Status: None (Preliminary result)  ? Collection Time: 03/17/22  9:38 AM  ? Specimen: BLOOD  ?Result Value Ref Range Status  ? Specimen Description   Final  ?  BLOOD LEFT ANTECUBITAL ?Performed at [facility], [place] [address], [place], [place] [address] ?  ?  Special Requests   Final  ?  BOTTLES DRAWN AEROBIC AND ANAEROBIC Blood Culture adequate volume ?Performed at [facility], [place] [address], [place], [place] [address] ?  ? Culture   Final  ?  NO GROWTH < 24 HOURS ?Performed at [facility], [place] [address], [place], [place] [address] ?  ? Report Status PENDING  Incomplete  ?Culture, blood (Routine X 2) w Reflex to ID Panel     Status: None (Preliminary result)  ? Collection Time: 03/17/22  9:45 AM  ? Specimen: BLOOD  ?Result Value Ref Range Status  ? Specimen Description   Final  ?  BLOOD RIGHT ANTECUBITAL ?Performed at [facility], [place] [address] [place], [place] [address] ?  ? Special Requests   Final  ?  BOTTLES DRAWN AEROBIC AND ANAEROBIC Blood Culture adequate volume ?Performed at [facility], [place] [address], [place], [place] [address] ?  ? Culture   Final  ?  NO GROWTH < 24 HOURS ?Performed at [facility], [place] [address] [place], [place] [address] ?  ? Report Status PENDING  Incomplete  ?  ? ? ? ? ? ?Radiology Studies: ?DG CHEST PORT 1 VIEW ? ?Result Date: 03/17/2022 ?CLINICAL DATA:  Fever. EXAM: PORTABLE CHEST 1 VIEW COMPARISON:  Chest x-ray 03/11/2022 FINDINGS: The cardiac  silhouette, mediastinal and hilar contours are within normal limits. Streaky areas of discoid atelectasis but no infiltrates or effusions. No pulmonary lesions. The bony thorax is intact. IMPRESSION: Streaky areas of discoid

## 2022-03-18 NOTE — Progress Notes (Signed)
[facility]  EEG REPORT    NAME: Christian [name]   MRN: [id number]  DOB: 07/03/1963  GENDER: male AGE: 58yr     SERVICE DATE: 04/29/22  STUDY DURATION: 33 minutes  STUDY NUMBER: [id number]  ORDERING PHYSICIAN: Rasmussen, Ben Gregory, DO  EEG TECHNOLOGIST: R. Bastidas  R. EEG T.  LOCATION: [facility]  EEG SYSTEM: [employer]     CLINICAL HISTORY:  71yr old male who is having this EEG done for abnormal spell.    Other Data   MEDICATIONS:  Current Outpatient Medications   Medication Sig Dispense Refill    Albuterol (PROAIR HFA, PROVENTIL HFA, VENTOLIN HFA) 90 mcg/actuation inhaler Take 1-2 puffs by inhalation every 4 hours if needed for wheezing. 17 g 1    Allopurinol (ZYLOPRIM) 300 mg Tablet TAKE ONE TABLET BY MOUTH EVERY DAY 90 tablet 1    Aspirin 81 mg Chewable Tablet Take 1 tablet by mouth every day. 30 tablet 0    Atorvastatin (LIPITOR) 40 mg tablet TAKE ONE TABLET BY MOUTH AT BEDTIME 90 tablet 1    Azelaic Acid (FINACEA) 15 % Gel Apply to forehead, cheeks, nose, and chin twice daily 50 g 3    Azelastine Nasal (ASTELIN) 137 mcg (0.1 %) Spray Instill 2 sprays into EACH nostril 2 times daily. 30 mL 6    Chlorthalidone (THALITONE) 25 mg Tablet TAKE ONE TABLET BY MOUTH EVERY DAY 30 tablet 5    Diclofenac (VOLTAREN) 1 % Gel Apply 2 g to the affected area three times daily if needed. 100 g 1    FLOVENT HFA 110 mcg/actuation Inhaler Take 1 puff by inhalation every day. 12 g 1    Fluticasone (FLONASE) 50 mcg/actuation nasal spray Instill 1 spray into EACH nostril every day. Use after performing nasal saline irrigations 16 g 11    Gabapentin (NEURONTIN) 300 mg Capsule TAKE ONE CAPSULE BY MOUTH EVERY MORNING AND TAKE TWO CAPSULES BY MOUTH EVERY EVENING 90 capsule 5    Losartan (COZAAR) 100 mg tablet TAKE ONE TABLET BY MOUTH EVERY DAY 30 tablet 5    Pantoprazole (PROTONIX) 40 mg Delayed Release Tablet TAKE ONE TABLET BY MOUTH EVERY MORNING BEFORE A MEAL 90 tablet 0    Prazosin (MINIPRESS) 5 mg Capsule Take 1 capsule by mouth  every day at bedtime. 90 capsule 1    Trazodone (DESYREL) 50 mg Tablet TAKE ONE TABLET BY MOUTH AT BEDTIME AS NEEDED 30 tablet 5     No current facility-administered medications for this visit.       TECHNICAL DESCRIPTION:  This digital video EEG was performed using the standard 10-20 system of electrode placement and one channel of EKG.           EEG DESCRIPTION:    Clinical State: Awake  Background:  9-10 Hz; posterior head regions; symmetric, waxing and waning, reactive to eye opening and closure  Beta: 18-22 Hz; frontocentral, symmetric, waxing and waning        No stage II/N2 sleep was recorded    ACTIVATION:  Hyperventilation was performed for 3-minutes, producing no abnormal discharges  Photic stimulation was performed at frequencies ranging from 1-60 Hz, producing no abnormal discharges           IMPRESSION: Normal        CLINICAL CORRELATION:  This EEG in the awake state is normal. There are no focal, lateralized or epileptiform abnormalities.   No episodes of concern were captured.      Palak   Parikh, MD

## 2022-03-19 DIAGNOSIS — N179 Acute kidney failure, unspecified: Secondary | ICD-10-CM | POA: Diagnosis not present

## 2022-03-19 DIAGNOSIS — I480 Paroxysmal atrial fibrillation: Secondary | ICD-10-CM | POA: Diagnosis not present

## 2022-03-19 DIAGNOSIS — R5381 Other malaise: Secondary | ICD-10-CM | POA: Diagnosis not present

## 2022-03-19 DIAGNOSIS — Z9181 History of falling: Secondary | ICD-10-CM | POA: Diagnosis not present

## 2022-03-19 DIAGNOSIS — I70223 Atherosclerosis of native arteries of extremities with rest pain, bilateral legs: Secondary | ICD-10-CM | POA: Diagnosis not present

## 2022-03-19 DIAGNOSIS — R600 Localized edema: Secondary | ICD-10-CM | POA: Diagnosis not present

## 2022-03-19 DIAGNOSIS — L03115 Cellulitis of right lower limb: Secondary | ICD-10-CM | POA: Diagnosis not present

## 2022-03-19 DIAGNOSIS — I4892 Unspecified atrial flutter: Secondary | ICD-10-CM | POA: Diagnosis not present

## 2022-03-19 DIAGNOSIS — L0390 Cellulitis, unspecified: Secondary | ICD-10-CM | POA: Diagnosis not present

## 2022-03-19 DIAGNOSIS — N178 Other acute kidney failure: Secondary | ICD-10-CM | POA: Diagnosis not present

## 2022-03-19 DIAGNOSIS — E161 Other hypoglycemia: Secondary | ICD-10-CM | POA: Diagnosis not present

## 2022-03-19 DIAGNOSIS — Z7982 Long term (current) use of aspirin: Secondary | ICD-10-CM | POA: Diagnosis not present

## 2022-03-19 DIAGNOSIS — I5032 Chronic diastolic (congestive) heart failure: Secondary | ICD-10-CM | POA: Diagnosis not present

## 2022-03-19 DIAGNOSIS — E11649 Type 2 diabetes mellitus with hypoglycemia without coma: Secondary | ICD-10-CM | POA: Diagnosis not present

## 2022-03-19 DIAGNOSIS — R262 Difficulty in walking, not elsewhere classified: Secondary | ICD-10-CM | POA: Diagnosis not present

## 2022-03-19 DIAGNOSIS — N1832 Chronic kidney disease, stage 3b: Secondary | ICD-10-CM | POA: Diagnosis not present

## 2022-03-19 DIAGNOSIS — E785 Hyperlipidemia, unspecified: Secondary | ICD-10-CM | POA: Diagnosis not present

## 2022-03-19 DIAGNOSIS — F319 Bipolar disorder, unspecified: Secondary | ICD-10-CM | POA: Diagnosis not present

## 2022-03-19 DIAGNOSIS — E162 Hypoglycemia, unspecified: Secondary | ICD-10-CM | POA: Diagnosis not present

## 2022-03-19 DIAGNOSIS — I130 Hypertensive heart and chronic kidney disease with heart failure and stage 1 through stage 4 chronic kidney disease, or unspecified chronic kidney disease: Secondary | ICD-10-CM | POA: Diagnosis not present

## 2022-03-19 DIAGNOSIS — F29 Unspecified psychosis not due to a substance or known physiological condition: Secondary | ICD-10-CM | POA: Diagnosis not present

## 2022-03-19 DIAGNOSIS — Z7401 Bed confinement status: Secondary | ICD-10-CM | POA: Diagnosis not present

## 2022-03-19 DIAGNOSIS — M6281 Muscle weakness (generalized): Secondary | ICD-10-CM | POA: Diagnosis not present

## 2022-03-19 LAB — GLUCOSE, CAPILLARY
Glucose-Capillary: 160 mg/dL — ABNORMAL HIGH (ref 70–99)
Glucose-Capillary: 166 mg/dL — ABNORMAL HIGH (ref 70–99)
Glucose-Capillary: 198 mg/dL — ABNORMAL HIGH (ref 70–99)

## 2022-03-19 MED ORDER — INSULIN GLARGINE 100 UNIT/ML ~~LOC~~ SOLN: 5.0000 [IU] | Freq: Every day | SUBCUTANEOUS | 3 refills | Status: DC

## 2022-03-19 MED ORDER — CEPHALEXIN 500 MG PO CAPS: 500.0000 mg | ORAL_CAPSULE | Freq: Three times a day (TID) | ORAL | 0 refills | Status: AC

## 2022-03-19 NOTE — Discharge Instructions (Signed)

## 2022-03-19 NOTE — Discharge Summary (Signed)
?Physician Discharge Summary ?  ?Patient: Christian [name] MRN: [id number] DOB: 1951/05/11  ?Admit date:     03/11/2022  ?Discharge date: 03/19/22  ?Discharge Physician: Jon Billings A Priya Matsen  ? ?PCP: [name], MD  ? ?Recommendations at discharge:  ? ? Monitor CBG adjust regimen as need. Careful with titration up, patient admitted with hypoglycemia.  ?Monitor renal function.  ?Follow up with wound care. Follow with Vascular.  ? ?Discharge Diagnoses: ?Principal Problem: ?  Hypoglycemia ?Active Problems: ?  OSA on CPAP ?  Bilateral lower extremity edema: chronic with venous stasis changes ?  Gout ?  Bipolar and related disorder (HCC) ?  Paroxysmal atrial fibrillation (HCC) ?  Atrial flutter, paroxysmal (HCC) ?  AKI (acute kidney injury) (HCC) ?  Chronic kidney disease, stage 3b (HCC) ? ?Resolved Problems: ?  * No resolved hospital problems. * ? ?Hospital Course: ?70 year old with past medical history significant for PAD, bilateral lower extremity edema, bipolar disorder, chronic heart failure preserved ejection fraction, OSA on CPAP, diabetes type 2, hypertension, gout who was sent to [facility] long ED 4/13 for lethargy in the vascular surgery office, patient found to be severely hypoglycemic with a CBG of 39.  Creatinine noted to be elevated at 2.2 from baseline 1.6.  Admitted with AKI.  Mental status has improved with dextrose infusion and subsequent oral intake.  Hypoglycemic agents were held. ?Patient's level debility require skilled nursing facility care.  Psychiatric status stable. ?  ?Patient supposed to be discharged 4//19 when he was noted to have low-grade fever 100.9.  Plan to proceed with work-up and for observation ?  ?Patient had episode of AMS last night. He was lethargic, slow to respond. Subsequently he wake up.  ? ?Assessment and Plan: ?No notes have been filed under this hospital service. ?Service: Hospitalist ?  ?1-Diabetes type 2 with hypoglycemia: ?Hemoglobin A1c 7.6 ?Patient stated that he was eating  normally on the day that he is noted to become hypoglycemic.  He took all the medication that morning. ?-Home regimen: Lantus, glimepiride and empagliflozin. All medications held on admission due to hypoglycemia. Empagliflozine and Lantus lower dose resume, CBG increased. Continue to hold glipizide.  ?-No further  hypoglycemia.  ?CBG increasing. Change diet to carb modified. Resume empagliflozin.  ?Resume low dose lantus.  ?Monitor for hypoglycemia.  ? ?  ?2-AKI on CKD stage IIIb, chronic  heart failure preserved ejection fraction compensated ?Creatinine on admission 2.2.  Creatinine has improved and down to 1.5. ?Torsemide has been resumed ?  ?Acute metabolic encephalopathy: Secondary to hypoglycemia initially.  ?Had an episode last night 4/20 were he was lethargic, slow to respond, not following command. CBG was normal at the time. Vitals stable. Patient wake up after rectal tempeture was checked.  ?-Plan to monitor for 24 hours in the hospital.  ?-EEG negative for seizure.  ?-neuro exam non foal.  ? -Suspect related to infection, episode on admission related to hypoglycemia.  ? ?Fever:  ?Patient spike fever 4/19. ?Chest x ray negative. Blood culture no growth to date.  ?Started on Keflex to cover for cellulitis LE.  ?UA negative ?  ?Hyponatremia; monitor.  ?  ?Severely debilitated: He will need rehab ?  ?  ?Mild elevation of troponin suspect demand ischemia in the setting of CKD as well ?Paroxysmal  A-fib: Continue with metoprolol and Xarelto ?Left leg wound, history of PAD: Continue with local wound care. ?  ?  ?Estimated body mass index is 33.19 kg/m? as calculated from the following: ?  Height as of this encounter: 6' (1.829 m). ?  Weight as of this encounter: 111 kg. ?  ?  ? ? ?  ? ? ?Consultants: None ?Procedures performed: EEG negative for seizure.  ?Disposition: Skilled nursing facility ?Diet recommendation:  ?Discharge Diet Orders (From admission, onward)  ? ?  Start     Ordered  ? 03/19/22 0000  Diet -  low sodium heart healthy       ? 03/19/22 0919  ? 03/16/22 0000  Diet - low sodium heart healthy       ? 03/16/22 1304  ? 03/16/22 0000  Diet Carb Modified       ? 03/16/22 1304  ? ?  ?  ? ?  ? ?Carb modified diet ?DISCHARGE MEDICATION: ?Allergies as of 03/19/2022   ? ?   Reactions  ? Amlodipine Swelling, Other (See Comments)  ? Legs became swollen  ? Onglyza [saxagliptin] Other (See Comments)  ? "Allergic," per [facility]  ? Zyprexa [olanzapine] Other (See Comments)  ? Allergy noted by ACT Team. No reaction specified (??)- "Allergic," per [facility]  ? Ritalin [methylphenidate] Anxiety  ? "[place]"  ? ?  ? ?  ?Medication List  ?  ? ?STOP taking these medications   ? ?glimepiride 2 MG tablet ?Commonly known as: AMARYL ?  ? ?  ? ?TAKE these medications   ? ?acetaminophen 500 MG tablet ?Commonly known as: TYLENOL ?Take 500-1,000 mg by mouth every 6 (six) hours as needed (pain.). ?  ?ARIPiprazole 20 MG tablet ?Commonly known as: ABILIFY ?Take 1 tablet (20 mg total) by mouth daily. ?  ?aspirin EC 81 MG tablet ?Take 81 mg by mouth daily. Swallow whole. ?  ?atorvastatin 10 MG tablet ?Commonly known as: LIPITOR ?Take 1 tablet (10 mg total) by mouth daily at 6 PM. ?What changed: when to take this ?  ?buPROPion 75 MG tablet ?Commonly known as: WELLBUTRIN ?Take 1 tablet (75 mg total) by mouth daily. ?  ?cephALEXin 500 MG capsule ?Commonly known as: KEFLEX ?Take 1 capsule (500 mg total) by mouth every 8 (eight) hours for 5 days. ?  ?divalproex 500 MG 24 hr tablet ?Commonly known as: DEPAKOTE ER ?Take 500 mg by mouth in the morning and at bedtime. ?  ?empagliflozin 10 MG Tabs tablet ?Commonly known as: JARDIANCE ?Take 10 mg by mouth daily. ?  ?insulin glargine 100 UNIT/ML injection ?Commonly known as: LANTUS ?Inject 0.05 mLs (5 Units total) into the skin daily. ?Start taking on: March 20, 2022 ?What changed: how much to take ?  ?liothyronine 5 MCG tablet ?Commonly known as: CYTOMEL ?Take 10 mcg by mouth daily. ?   ?metoprolol succinate 50 MG 24 hr tablet ?Commonly known as: TOPROL-XL ?Take 1 tablet (50 mg total) by mouth daily. Take with or immediately following a meal. ?  ?mirtazapine 15 MG tablet ?Commonly known as: REMERON ?Take 15 mg by mouth at bedtime. ?  ?polyethylene glycol powder 17 GM/SCOOP powder ?Commonly known as: GLYCOLAX/MIRALAX ?Take 17 g by mouth daily. ?What changed:  ?when to take this ?reasons to take this ?  ?QUEtiapine 25 MG tablet ?Commonly known as: SEROQUEL ?Take 0.5 tablets (12.5 mg total) by mouth at bedtime. ?What changed:  ?medication strength ?how much to take ?  ?rivaroxaban 20 MG Tabs tablet ?Commonly known as: XARELTO ?Take 1 tablet (20 mg total) by mouth daily with supper. ?  ?torsemide 20 MG tablet ?Commonly known as: DEMADEX ?Take 1 tablet (20 mg total) by  mouth daily. ?What changed: when to take this ?  ? ?  ? ?  ?  ? ? ?  ?Discharge Care Instructions  ?(From admission, onward)  ?  ? ? ?  ? ?  Start     Ordered  ? 03/19/22 0000  Discharge wound care:       ?Comments: See above  ? 03/19/22 0919  ? 03/16/22 0000  Discharge wound care:       ?Comments: Wound care to left LE chronic, full thickness wound:  Cleanse with NS, pat dry. Cover with size-appropriate piece of xeroform gauze [employer] [place] # [phone]), top with dry gauze, ABD and secure with a few turns of Kerlix roll gauze/paper tape.  Change daily..  ? 03/16/22 1304  ? ?  ?  ? ?  ? ? Contact information for after-discharge care   ? ? Destination   ? ? [facility] .   ?Service: Skilled Nursing ?Contact information: ?[address] ?[place] [address] ?[phone] ? ?  ?  ? ?  ?  ? ?  ?  ? ?  ? ?Discharge Exam: ?Filed Weights  ? 03/16/22 0500 03/17/22 0457 03/19/22 0500  ?Weight: 115.6 kg 111 kg 112 kg  ? ?General; NAD ?CVS; S 1,S 2  ?Alert and conversant.  ? ?Condition at discharge: stable ? ?The results of significant diagnostics from this hospitalization (including imaging,  microbiology, ancillary and laboratory) are listed below for reference.  ? ?Imaging Studies: ?CT Head Wo Contrast ? ?Result Date: 03/11/2022 ?CLINICAL DATA:  Altered mental status. EXAM: CT HEAD WITHOUT CONTRAST

## 2022-03-19 NOTE — Progress Notes (Signed)
NUTRITION NOTE ? ?Consult received for DM diet education. Placed Carbohydrate Counting for People with Diabetes handout from the Academy of Nutrition and Dietetics in AVS in the event that RD is unable to make it to patient's room prior to his discharge; discharge order and discharge summary entered earlier this AM. ? ?Review of Facesheet indicates that patient has [employer]. Will place referral for Nutrition and Diabetes Education Services (NDES) for outpatient DM diet counseling. ? ? ? ? ?Trenton Gammon, MS, RD, LDN ?Registered Dietitian II ?Inpatient Clinical Nutrition ?RD pager # and on-call/weekend pager # available in AMION  ? ?

## 2022-03-19 NOTE — Progress Notes (Signed)
Report given to Ronny Bacon at [facility] rehab. All questions & concerns addressed. Pt alert & oriented at time of d/c & [facility] transporting patient. Cellphone & clothing with pt. ?

## 2022-03-19 NOTE — TOC Transition Note (Signed)
Transition of Care (TOC) - CM/SW Discharge Note ? ? ?Patient Details  ?Name: Christian [name] ?MRN: [id number] ?Date of Birth: [birth year] ? ?Transition of Care [facility]) CM/SW Contact:  ?Lanier Clam, RN ?Phone Number: ?03/19/2022, 10:25 AM ? ? ?Clinical Narrative: d/c today to [facility] rehab rep [place] accepted.d/c summary faxed, going to rm#[id number],report tel#[phone]. [facility] to be called.   ? ? ? ?Final next level of care: Skilled Nursing Facility ?Barriers to Discharge: No Barriers Identified ? ? ?Patient Goals and CMS Choice ?Patient states their goals for this hospitalization and ongoing recovery are:: home ?CMS Medicare.gov Compare Post Acute Care list provided to:: Patient ?Choice offered to / list presented to : Patient ? ?Discharge Placement ?PASRR number recieved: 03/16/22 ?           ?Patient [facility] at[facility] ?Patient to be transferred to facility by: [facility] ?Name of family member notified: patient ?Patient and family notified of of transfer: 03/19/22 ? ?Discharge Plan and Services ?  ?Discharge Planning Services: CM Consult ?Post Acute Care Choice: Durable Medical Equipment, Home Health          ?  ?  ?  ?  ?  ?  ?  ?  ?  ?  ? ?Social Determinants of Health (SDOH) Interventions ?  ? ? ?Readmission Risk Interventions ? ?  01/28/2022  ? 10:17 AM  ?Readmission Risk Prevention Plan  ?Transportation Screening Complete  ?PCP or Specialist Appt within 5-7 Days Complete  ?Home Care Screening Complete  ?Medication Review (RN CM) Complete  ? ? ? ? ? ?

## 2022-03-22 LAB — CULTURE, BLOOD (ROUTINE X 2)
Culture: NO GROWTH
Culture: NO GROWTH
Special Requests: ADEQUATE
Special Requests: ADEQUATE

## 2022-03-23 DIAGNOSIS — L03115 Cellulitis of right lower limb: Secondary | ICD-10-CM | POA: Diagnosis not present

## 2022-03-23 DIAGNOSIS — R5381 Other malaise: Secondary | ICD-10-CM | POA: Diagnosis not present

## 2022-03-23 DIAGNOSIS — E162 Hypoglycemia, unspecified: Secondary | ICD-10-CM | POA: Diagnosis not present

## 2022-03-29 DIAGNOSIS — I509 Heart failure, unspecified: Secondary | ICD-10-CM | POA: Diagnosis not present

## 2022-03-29 DIAGNOSIS — D6869 Other thrombophilia: Secondary | ICD-10-CM | POA: Diagnosis not present

## 2022-03-29 DIAGNOSIS — E782 Mixed hyperlipidemia: Secondary | ICD-10-CM | POA: Diagnosis not present

## 2022-03-29 DIAGNOSIS — E039 Hypothyroidism, unspecified: Secondary | ICD-10-CM | POA: Diagnosis not present

## 2022-03-29 DIAGNOSIS — I4891 Unspecified atrial fibrillation: Secondary | ICD-10-CM | POA: Diagnosis not present

## 2022-03-29 DIAGNOSIS — L97919 Non-pressure chronic ulcer of unspecified part of right lower leg with unspecified severity: Secondary | ICD-10-CM | POA: Diagnosis not present

## 2022-03-29 DIAGNOSIS — I700 Atherosclerosis of aorta: Secondary | ICD-10-CM | POA: Diagnosis not present

## 2022-03-29 DIAGNOSIS — E1140 Type 2 diabetes mellitus with diabetic neuropathy, unspecified: Secondary | ICD-10-CM | POA: Diagnosis not present

## 2022-03-29 DIAGNOSIS — I110 Hypertensive heart disease with heart failure: Secondary | ICD-10-CM | POA: Diagnosis not present

## 2022-03-30 ENCOUNTER — Encounter (HOSPITAL_COMMUNITY): Payer: Self-pay | Admitting: Internal Medicine

## 2022-03-30 ENCOUNTER — Inpatient Hospital Stay (HOSPITAL_COMMUNITY)
Admission: EM | Admit: 2022-03-30 | Discharge: 2022-05-20 | DRG: 640 | Disposition: A | Discharge status: Skilled Nursing Facility | Payer: Medicare HMO | Source: Skilled Nursing Facility | Attending: Internal Medicine | Admitting: Internal Medicine

## 2022-03-30 DIAGNOSIS — E8809 Other disorders of plasma-protein metabolism, not elsewhere classified: Secondary | ICD-10-CM | POA: Diagnosis present

## 2022-03-30 DIAGNOSIS — Z7401 Bed confinement status: Secondary | ICD-10-CM | POA: Diagnosis not present

## 2022-03-30 DIAGNOSIS — E039 Hypothyroidism, unspecified: Secondary | ICD-10-CM | POA: Diagnosis present

## 2022-03-30 DIAGNOSIS — N183 Chronic kidney disease, stage 3 unspecified: Secondary | ICD-10-CM | POA: Diagnosis not present

## 2022-03-30 DIAGNOSIS — N179 Acute kidney failure, unspecified: Secondary | ICD-10-CM | POA: Diagnosis present

## 2022-03-30 DIAGNOSIS — M25512 Pain in left shoulder: Secondary | ICD-10-CM | POA: Diagnosis not present

## 2022-03-30 DIAGNOSIS — M6289 Other specified disorders of muscle: Secondary | ICD-10-CM | POA: Diagnosis not present

## 2022-03-30 DIAGNOSIS — E1165 Type 2 diabetes mellitus with hyperglycemia: Secondary | ICD-10-CM | POA: Diagnosis not present

## 2022-03-30 DIAGNOSIS — T797XXA Traumatic subcutaneous emphysema, initial encounter: Secondary | ICD-10-CM | POA: Diagnosis not present

## 2022-03-30 DIAGNOSIS — I700 Atherosclerosis of aorta: Secondary | ICD-10-CM | POA: Diagnosis not present

## 2022-03-30 DIAGNOSIS — A411 Sepsis due to other specified staphylococcus: Secondary | ICD-10-CM | POA: Diagnosis not present

## 2022-03-30 DIAGNOSIS — R1310 Dysphagia, unspecified: Secondary | ICD-10-CM | POA: Diagnosis present

## 2022-03-30 DIAGNOSIS — A419 Sepsis, unspecified organism: Secondary | ICD-10-CM

## 2022-03-30 DIAGNOSIS — Z794 Long term (current) use of insulin: Secondary | ICD-10-CM

## 2022-03-30 DIAGNOSIS — Z6841 Body Mass Index (BMI) 40.0 and over, adult: Secondary | ICD-10-CM

## 2022-03-30 DIAGNOSIS — L89154 Pressure ulcer of sacral region, stage 4: Secondary | ICD-10-CM | POA: Diagnosis present

## 2022-03-30 DIAGNOSIS — H0520 Unspecified exophthalmos: Secondary | ICD-10-CM | POA: Diagnosis not present

## 2022-03-30 DIAGNOSIS — F3189 Other bipolar disorder: Secondary | ICD-10-CM | POA: Diagnosis not present

## 2022-03-30 DIAGNOSIS — G9341 Metabolic encephalopathy: Secondary | ICD-10-CM | POA: Diagnosis not present

## 2022-03-30 DIAGNOSIS — E119 Type 2 diabetes mellitus without complications: Secondary | ICD-10-CM

## 2022-03-30 DIAGNOSIS — I672 Cerebral atherosclerosis: Secondary | ICD-10-CM | POA: Diagnosis not present

## 2022-03-30 DIAGNOSIS — L89159 Pressure ulcer of sacral region, unspecified stage: Secondary | ICD-10-CM | POA: Diagnosis not present

## 2022-03-30 DIAGNOSIS — M79642 Pain in left hand: Secondary | ICD-10-CM | POA: Diagnosis not present

## 2022-03-30 DIAGNOSIS — Z4682 Encounter for fitting and adjustment of non-vascular catheter: Secondary | ICD-10-CM | POA: Diagnosis not present

## 2022-03-30 DIAGNOSIS — E162 Hypoglycemia, unspecified: Secondary | ICD-10-CM | POA: Diagnosis present

## 2022-03-30 DIAGNOSIS — E11649 Type 2 diabetes mellitus with hypoglycemia without coma: Secondary | ICD-10-CM | POA: Diagnosis present

## 2022-03-30 DIAGNOSIS — L089 Local infection of the skin and subcutaneous tissue, unspecified: Secondary | ICD-10-CM | POA: Diagnosis present

## 2022-03-30 DIAGNOSIS — Z9861 Coronary angioplasty status: Secondary | ICD-10-CM

## 2022-03-30 DIAGNOSIS — J9811 Atelectasis: Secondary | ICD-10-CM | POA: Diagnosis present

## 2022-03-30 DIAGNOSIS — I70203 Unspecified atherosclerosis of native arteries of extremities, bilateral legs: Secondary | ICD-10-CM | POA: Diagnosis present

## 2022-03-30 DIAGNOSIS — Z79890 Hormone replacement therapy (postmenopausal): Secondary | ICD-10-CM

## 2022-03-30 DIAGNOSIS — M75102 Unspecified rotator cuff tear or rupture of left shoulder, not specified as traumatic: Secondary | ICD-10-CM | POA: Diagnosis present

## 2022-03-30 DIAGNOSIS — R0602 Shortness of breath: Secondary | ICD-10-CM | POA: Diagnosis not present

## 2022-03-30 DIAGNOSIS — I5032 Chronic diastolic (congestive) heart failure: Secondary | ICD-10-CM | POA: Diagnosis present

## 2022-03-30 DIAGNOSIS — A409 Streptococcal sepsis, unspecified: Secondary | ICD-10-CM | POA: Diagnosis not present

## 2022-03-30 DIAGNOSIS — I5033 Acute on chronic diastolic (congestive) heart failure: Secondary | ICD-10-CM | POA: Diagnosis not present

## 2022-03-30 DIAGNOSIS — Z9989 Dependence on other enabling machines and devices: Secondary | ICD-10-CM | POA: Diagnosis not present

## 2022-03-30 DIAGNOSIS — L89152 Pressure ulcer of sacral region, stage 2: Secondary | ICD-10-CM | POA: Diagnosis present

## 2022-03-30 DIAGNOSIS — Z515 Encounter for palliative care: Secondary | ICD-10-CM | POA: Diagnosis not present

## 2022-03-30 DIAGNOSIS — I5030 Unspecified diastolic (congestive) heart failure: Secondary | ICD-10-CM | POA: Diagnosis not present

## 2022-03-30 DIAGNOSIS — R339 Retention of urine, unspecified: Secondary | ICD-10-CM | POA: Diagnosis not present

## 2022-03-30 DIAGNOSIS — E876 Hypokalemia: Secondary | ICD-10-CM | POA: Diagnosis present

## 2022-03-30 DIAGNOSIS — Z8249 Family history of ischemic heart disease and other diseases of the circulatory system: Secondary | ICD-10-CM

## 2022-03-30 DIAGNOSIS — I130 Hypertensive heart and chronic kidney disease with heart failure and stage 1 through stage 4 chronic kidney disease, or unspecified chronic kidney disease: Secondary | ICD-10-CM | POA: Diagnosis present

## 2022-03-30 DIAGNOSIS — M7989 Other specified soft tissue disorders: Secondary | ICD-10-CM | POA: Diagnosis not present

## 2022-03-30 DIAGNOSIS — N1831 Chronic kidney disease, stage 3a: Secondary | ICD-10-CM | POA: Diagnosis present

## 2022-03-30 DIAGNOSIS — E875 Hyperkalemia: Principal | ICD-10-CM | POA: Diagnosis present

## 2022-03-30 DIAGNOSIS — I3139 Other pericardial effusion (noninflammatory): Secondary | ICD-10-CM | POA: Diagnosis not present

## 2022-03-30 DIAGNOSIS — R531 Weakness: Secondary | ICD-10-CM | POA: Diagnosis present

## 2022-03-30 DIAGNOSIS — I739 Peripheral vascular disease, unspecified: Secondary | ICD-10-CM | POA: Diagnosis not present

## 2022-03-30 DIAGNOSIS — M109 Gout, unspecified: Secondary | ICD-10-CM | POA: Diagnosis present

## 2022-03-30 DIAGNOSIS — R748 Abnormal levels of other serum enzymes: Secondary | ICD-10-CM

## 2022-03-30 DIAGNOSIS — Z7901 Long term (current) use of anticoagulants: Secondary | ICD-10-CM

## 2022-03-30 DIAGNOSIS — M6282 Rhabdomyolysis: Secondary | ICD-10-CM | POA: Diagnosis present

## 2022-03-30 DIAGNOSIS — I110 Hypertensive heart disease with heart failure: Secondary | ICD-10-CM | POA: Diagnosis not present

## 2022-03-30 DIAGNOSIS — R54 Age-related physical debility: Secondary | ICD-10-CM | POA: Diagnosis present

## 2022-03-30 DIAGNOSIS — G210 Malignant neuroleptic syndrome: Secondary | ICD-10-CM | POA: Diagnosis not present

## 2022-03-30 DIAGNOSIS — R6520 Severe sepsis without septic shock: Secondary | ICD-10-CM | POA: Diagnosis not present

## 2022-03-30 DIAGNOSIS — G249 Dystonia, unspecified: Secondary | ICD-10-CM | POA: Diagnosis present

## 2022-03-30 DIAGNOSIS — R509 Fever, unspecified: Secondary | ICD-10-CM | POA: Diagnosis not present

## 2022-03-30 DIAGNOSIS — I509 Heart failure, unspecified: Secondary | ICD-10-CM | POA: Diagnosis not present

## 2022-03-30 DIAGNOSIS — G4733 Obstructive sleep apnea (adult) (pediatric): Secondary | ICD-10-CM | POA: Diagnosis present

## 2022-03-30 DIAGNOSIS — Z8673 Personal history of transient ischemic attack (TIA), and cerebral infarction without residual deficits: Secondary | ICD-10-CM

## 2022-03-30 DIAGNOSIS — I959 Hypotension, unspecified: Secondary | ICD-10-CM | POA: Diagnosis not present

## 2022-03-30 DIAGNOSIS — E785 Hyperlipidemia, unspecified: Secondary | ICD-10-CM | POA: Diagnosis present

## 2022-03-30 DIAGNOSIS — R7881 Bacteremia: Secondary | ICD-10-CM

## 2022-03-30 DIAGNOSIS — F319 Bipolar disorder, unspecified: Secondary | ICD-10-CM | POA: Diagnosis present

## 2022-03-30 DIAGNOSIS — Z833 Family history of diabetes mellitus: Secondary | ICD-10-CM

## 2022-03-30 DIAGNOSIS — E1140 Type 2 diabetes mellitus with diabetic neuropathy, unspecified: Secondary | ICD-10-CM | POA: Diagnosis not present

## 2022-03-30 DIAGNOSIS — L89150 Pressure ulcer of sacral region, unstageable: Secondary | ICD-10-CM | POA: Diagnosis not present

## 2022-03-30 DIAGNOSIS — Z86718 Personal history of other venous thrombosis and embolism: Secondary | ICD-10-CM

## 2022-03-30 DIAGNOSIS — Z888 Allergy status to other drugs, medicaments and biological substances status: Secondary | ICD-10-CM

## 2022-03-30 DIAGNOSIS — E1159 Type 2 diabetes mellitus with other circulatory complications: Secondary | ICD-10-CM | POA: Diagnosis not present

## 2022-03-30 DIAGNOSIS — E1151 Type 2 diabetes mellitus with diabetic peripheral angiopathy without gangrene: Secondary | ICD-10-CM | POA: Diagnosis present

## 2022-03-30 DIAGNOSIS — I083 Combined rheumatic disorders of mitral, aortic and tricuspid valves: Secondary | ICD-10-CM | POA: Diagnosis not present

## 2022-03-30 DIAGNOSIS — Z7189 Other specified counseling: Secondary | ICD-10-CM | POA: Diagnosis not present

## 2022-03-30 DIAGNOSIS — R2232 Localized swelling, mass and lump, left upper limb: Secondary | ICD-10-CM | POA: Diagnosis not present

## 2022-03-30 DIAGNOSIS — R627 Adult failure to thrive: Secondary | ICD-10-CM | POA: Diagnosis present

## 2022-03-30 DIAGNOSIS — S4992XA Unspecified injury of left shoulder and upper arm, initial encounter: Secondary | ICD-10-CM | POA: Diagnosis not present

## 2022-03-30 DIAGNOSIS — E1122 Type 2 diabetes mellitus with diabetic chronic kidney disease: Secondary | ICD-10-CM | POA: Diagnosis not present

## 2022-03-30 DIAGNOSIS — G9340 Encephalopathy, unspecified: Secondary | ICD-10-CM | POA: Diagnosis not present

## 2022-03-30 DIAGNOSIS — G252 Other specified forms of tremor: Secondary | ICD-10-CM | POA: Diagnosis not present

## 2022-03-30 DIAGNOSIS — G8929 Other chronic pain: Secondary | ICD-10-CM | POA: Diagnosis present

## 2022-03-30 DIAGNOSIS — R410 Disorientation, unspecified: Secondary | ICD-10-CM | POA: Diagnosis not present

## 2022-03-30 DIAGNOSIS — Z7984 Long term (current) use of oral hypoglycemic drugs: Secondary | ICD-10-CM

## 2022-03-30 DIAGNOSIS — Z751 Person awaiting admission to adequate facility elsewhere: Secondary | ICD-10-CM

## 2022-03-30 DIAGNOSIS — I480 Paroxysmal atrial fibrillation: Secondary | ICD-10-CM | POA: Diagnosis present

## 2022-03-30 DIAGNOSIS — I2510 Atherosclerotic heart disease of native coronary artery without angina pectoris: Secondary | ICD-10-CM | POA: Diagnosis not present

## 2022-03-30 DIAGNOSIS — Z818 Family history of other mental and behavioral disorders: Secondary | ICD-10-CM

## 2022-03-30 DIAGNOSIS — I10 Essential (primary) hypertension: Secondary | ICD-10-CM | POA: Diagnosis not present

## 2022-03-30 LAB — COMPREHENSIVE METABOLIC PANEL
ALT: 21 U/L (ref 0–44)
AST: 45 U/L — ABNORMAL HIGH (ref 15–41)
Albumin: 2.8 g/dL — ABNORMAL LOW (ref 3.5–5.0)
Alkaline Phosphatase: 77 U/L (ref 38–126)
Anion gap: 10 (ref 5–15)
BUN: 76 mg/dL — ABNORMAL HIGH (ref 8–23)
CO2: 24 mmol/L (ref 22–32)
Calcium: 9.5 mg/dL (ref 8.9–10.3)
Chloride: 100 mmol/L (ref 98–111)
Creatinine, Ser: 1.98 mg/dL — ABNORMAL HIGH (ref 0.61–1.24)
GFR, Estimated: 36 mL/min — ABNORMAL LOW (ref >60)
Glucose, Bld: 65 mg/dL — ABNORMAL LOW (ref 70–99)
Potassium: 6.5 mmol/L (ref 3.5–5.1)
Sodium: 134 mmol/L — ABNORMAL LOW (ref 135–145)
Total Bilirubin: 1.4 mg/dL — ABNORMAL HIGH (ref 0.3–1.2)
Total Protein: 7.4 g/dL (ref 6.5–8.1)

## 2022-03-30 LAB — CBG MONITORING, ED
Glucose-Capillary: 203 mg/dL — ABNORMAL HIGH (ref 70–99)
Glucose-Capillary: 22 mg/dL — CL (ref 70–99)
Glucose-Capillary: 23 mg/dL — CL (ref 70–99)
Glucose-Capillary: 247 mg/dL — ABNORMAL HIGH (ref 70–99)
Glucose-Capillary: 31 mg/dL — CL (ref 70–99)
Glucose-Capillary: 316 mg/dL — ABNORMAL HIGH (ref 70–99)
Glucose-Capillary: 318 mg/dL — ABNORMAL HIGH (ref 70–99)
Glucose-Capillary: 96 mg/dL (ref 70–99)

## 2022-03-30 LAB — CK: Total CK: 820 U/L — ABNORMAL HIGH (ref 49–397)

## 2022-03-30 LAB — CBC WITH DIFFERENTIAL/PLATELET
Abs Immature Granulocytes: 0.03 10*3/uL (ref 0.00–0.07)
Basophils Absolute: 0 10*3/uL (ref 0.0–0.1)
Basophils Relative: 0 %
Eosinophils Absolute: 0 10*3/uL (ref 0.0–0.5)
Eosinophils Relative: 1 %
HCT: 46.7 % (ref 39.0–52.0)
Hemoglobin: 14.9 g/dL (ref 13.0–17.0)
Immature Granulocytes: 1 %
Lymphocytes Relative: 18 %
Lymphs Abs: 1.2 10*3/uL (ref 0.7–4.0)
MCH: 26.5 pg (ref 26.0–34.0)
MCHC: 31.9 g/dL (ref 30.0–36.0)
MCV: 83.1 fL (ref 80.0–100.0)
Monocytes Absolute: 0.9 10*3/uL (ref 0.1–1.0)
Monocytes Relative: 13 %
Neutro Abs: 4.5 10*3/uL (ref 1.7–7.7)
Neutrophils Relative %: 67 %
Platelets: 297 10*3/uL (ref 150–400)
RBC: 5.62 MIL/uL (ref 4.22–5.81)
RDW: 17.5 % — ABNORMAL HIGH (ref 11.5–15.5)
WBC: 6.6 10*3/uL (ref 4.0–10.5)
nRBC: 0 % (ref 0.0–0.2)

## 2022-03-30 LAB — PROTIME-INR
INR: 2.2 — ABNORMAL HIGH (ref 0.8–1.2)
Prothrombin Time: 24.5 seconds — ABNORMAL HIGH (ref 11.4–15.2)

## 2022-03-30 LAB — TROPONIN I (HIGH SENSITIVITY)
Troponin I (High Sensitivity): 19 ng/L — ABNORMAL HIGH (ref <18)
Troponin I (High Sensitivity): 18 ng/L — ABNORMAL HIGH (ref <18)

## 2022-03-30 LAB — VALPROIC ACID LEVEL: Valproic Acid Lvl: 43 ug/mL — ABNORMAL LOW (ref 50.0–100.0)

## 2022-03-30 MED ORDER — ATORVASTATIN CALCIUM 10 MG PO TABS
10.0000 mg | ORAL_TABLET | Freq: Every evening | ORAL | Status: DC
  Administered 2022-03-30: 10 mg via ORAL
  Filled 2022-03-30: qty 1

## 2022-03-30 MED ORDER — DIVALPROEX SODIUM ER 500 MG PO TB24
500.0000 mg | ORAL_TABLET | Freq: Two times a day (BID) | ORAL | Status: DC
  Administered 2022-03-30 – 2022-04-01 (×4): 500 mg via ORAL
  Filled 2022-03-30 (×5): qty 1

## 2022-03-30 MED ORDER — DEXTROSE 50 % IV SOLN
1.0000 | Freq: Once | INTRAVENOUS | Status: AC
  Administered 2022-03-30: 50 mL via INTRAVENOUS
  Filled 2022-03-30: qty 50

## 2022-03-30 MED ORDER — MIRTAZAPINE 15 MG PO TABS
15.0000 mg | ORAL_TABLET | Freq: Every day | ORAL | Status: DC
  Administered 2022-03-30 – 2022-03-31 (×2): 15 mg via ORAL
  Filled 2022-03-30 (×3): qty 1

## 2022-03-30 MED ORDER — QUETIAPINE FUMARATE 25 MG PO TABS
12.5000 mg | ORAL_TABLET | Freq: Every day | ORAL | Status: DC
  Administered 2022-03-30 – 2022-03-31 (×2): 12.5 mg via ORAL
  Filled 2022-03-30 (×3): qty 1

## 2022-03-30 MED ORDER — DEXTROSE 50 % IV SOLN
50.0000 mL | Freq: Once | INTRAVENOUS | Status: AC
Start: 2022-03-30 — End: 2022-03-30
  Administered 2022-03-30: 50 mL via INTRAVENOUS
  Filled 2022-03-30: qty 50

## 2022-03-30 MED ORDER — DEXTROSE 10 % IV SOLN: Freq: Once | INTRAVENOUS | Status: AC

## 2022-03-30 MED ORDER — INSULIN ASPART 100 UNIT/ML IV SOLN: 5.0000 [IU] | Freq: Once | INTRAVENOUS | Status: DC

## 2022-03-30 MED ORDER — SODIUM BICARBONATE 8.4 % IV SOLN
50.0000 meq | Freq: Once | INTRAVENOUS | Status: AC
  Administered 2022-03-30: 50 meq via INTRAVENOUS
  Filled 2022-03-30: qty 50

## 2022-03-30 MED ORDER — LIOTHYRONINE SODIUM 5 MCG PO TABS
10.0000 ug | ORAL_TABLET | Freq: Every day | ORAL | Status: DC
  Administered 2022-03-31 – 2022-05-16 (×47): 10 ug via ORAL
  Filled 2022-03-30 (×50): qty 2

## 2022-03-30 MED ORDER — ARIPIPRAZOLE 10 MG PO TABS
20.0000 mg | ORAL_TABLET | Freq: Every day | ORAL | Status: DC
  Administered 2022-03-31 – 2022-04-01 (×2): 20 mg via ORAL
  Filled 2022-03-30 (×2): qty 2

## 2022-03-30 MED ORDER — BUPROPION HCL 75 MG PO TABS
75.0000 mg | ORAL_TABLET | Freq: Every day | ORAL | Status: DC
  Administered 2022-03-31 – 2022-04-01 (×2): 75 mg via ORAL
  Filled 2022-03-30 (×3): qty 1

## 2022-03-30 MED ORDER — RIVAROXABAN 20 MG PO TABS
20.0000 mg | ORAL_TABLET | Freq: Every day | ORAL | Status: DC
  Administered 2022-03-31 – 2022-04-16 (×16): 20 mg via ORAL
  Filled 2022-03-30 (×16): qty 1

## 2022-03-30 MED ORDER — CALCIUM GLUCONATE-NACL 1-0.675 GM/50ML-% IV SOLN
1.0000 g | Freq: Once | INTRAVENOUS | Status: AC
  Administered 2022-03-30: 1000 mg via INTRAVENOUS
  Filled 2022-03-30: qty 50

## 2022-03-30 MED ORDER — SODIUM ZIRCONIUM CYCLOSILICATE 10 G PO PACK
10.0000 g | PACK | Freq: Once | ORAL | Status: AC
  Administered 2022-03-30: 10 g via ORAL
  Filled 2022-03-30: qty 1

## 2022-03-30 MED ORDER — ACETAMINOPHEN 650 MG RE SUPP
650.0000 mg | Freq: Four times a day (QID) | RECTAL | Status: DC | PRN
Start: 2022-03-30 — End: 2022-05-20
  Administered 2022-04-11: 650 mg via RECTAL
  Filled 2022-03-30: qty 1

## 2022-03-30 MED ORDER — DEXTROSE 10 % IV SOLN: INTRAVENOUS | Status: AC

## 2022-03-30 MED ORDER — ACETAMINOPHEN 325 MG PO TABS
650.0000 mg | ORAL_TABLET | Freq: Four times a day (QID) | ORAL | Status: DC | PRN
Start: 2022-03-30 — End: 2022-05-20
  Administered 2022-04-01 – 2022-05-16 (×19): 650 mg via ORAL
  Filled 2022-03-30 (×20): qty 2

## 2022-03-30 NOTE — H&P (Addendum)
?History and Physical  ? ? ?Christian [name] WYO:[id number] DOB: 02/16/1951 DOA: 03/30/2022 ? ?PCP: [name], MD  ?Patient coming from: Home. ? ?Chief Complaint: Weakness. ? ?HPI: Christian [name] is a 70 y.o. male with history of peripheral arterial disease, bipolar disorder, diastolic CHF, chronic kidney disease, diabetes mellitus, sleep apnea, atrial fibrillation who was recently admitted for acute renal failure with hypoglycemia cellulitis eventually discharged to rehab and was discharged from rehab last week has been unable to ambulate and was feeling generally weak was brought to the ER.  Patient states since his last admission he has not ambulated.  Also has developed sacral decubitus ulcers.  Denies chest pain abdominal pain nausea vomiting patient states he has been eating well. ? ?ED Course: In the ER patient is found to be hypoglycemic with blood sugar as low as 30s.  Patient lab work also show hyperkalemia with potassium of 6.5.  Creatinine is 1.9.  CK level is 8 and 20.  On exam patient does have a sacral decubitus ulcer which does not look infected.  Has poor pulses in the lower extremity but no acute ischemic changes.  Patient was given Lokelma calcium gluconate and sodium bicarbonate.  Patient was started on D10 and D50 for hypoglycemia admitted for further work-up. ? ?Review of Systems: As per HPI, rest all negative. ? ? ?Past Medical History:  ?Diagnosis Date  ? Bilateral lower extremity edema: chronic with venous stasis changes 06/02/2014  ? Bipolar 1 disorder (HCC)   ? Bipolar disorder (HCC)   ? CHF (congestive heart failure) (HCC)   ? Chronic kidney disease   ? Gout   ? Gout   ? Hx of blood clots   ? Hypertension   ? Mood swings   ? OSA on CPAP 06/02/2014  ? Other and unspecified hyperlipidemia 06/02/2014  ? Type II or unspecified type diabetes mellitus with unspecified complication, uncontrolled 06/02/2014  ? ? ?Past Surgical History:  ?Procedure Laterality Date  ? ABDOMINAL AORTOGRAM W/LOWER EXTREMITY  N/A 09/16/2021  ? Procedure: ABDOMINAL AORTOGRAM W/LOWER EXTREMITY;  Surgeon: Victorino Sparrow, MD;  Location: [facility] INVASIVE CV LAB;  Service: Cardiovascular;  Laterality: N/A;  ? LEFT HEART CATH AND CORONARY ANGIOGRAPHY Left 04/01/2020  ? Procedure: LEFT HEART CATH AND CORONARY ANGIOGRAPHY;  Surgeon: Laurier Nancy, MD;  Location: [facility] INVASIVE CV LAB;  Service: Cardiovascular;  Laterality: Left;  ? PERIPHERAL VASCULAR BALLOON ANGIOPLASTY Left 09/16/2021  ? Procedure: PERIPHERAL VASCULAR BALLOON ANGIOPLASTY;  Surgeon: Victorino Sparrow, MD;  Location: [facility] INVASIVE CV LAB;  Service: Cardiovascular;  Laterality: Left;  attempted to PTA posterior tibial artery; unable to cross lesion  ? ? ? reports that he has never smoked. He has never used smokeless tobacco. He reports that he does not drink alcohol and does not use drugs. ? ?Allergies  ?Allergen Reactions  ? Amlodipine Swelling and Other (See Comments)  ?  Legs became swollen  ? Onglyza [Saxagliptin] Other (See Comments)  ?  "Allergic," per [facility]  ? Zyprexa [Olanzapine] Other (See Comments)  ?  Allergy noted by ACT Team. No reaction specified (??)- "Allergic," per [facility]  ? Ritalin [Methylphenidate] Anxiety and Other (See Comments)  ?  "[place]"  ? ? ?Family History  ?Problem Relation Age of Onset  ? Dementia Mother   ? Arthritis Father   ? Hypertension Father   ? Mental illness Sister   ? Diabetes Sister   ? Heart failure Neg Hx   ? Kidney  failure Neg Hx   ? Cancer Neg Hx   ? ? ?Prior to Admission medications   ?Medication Sig Start Date End Date Taking? Authorizing Provider  ?acetaminophen (TYLENOL) 500 MG tablet Take 500-1,000 mg by mouth every 6 (six) hours as needed (pain.).   Yes [provider]  ?ARIPiprazole (ABILIFY) 20 MG tablet Take 1 tablet (20 mg total) by mouth daily. 02/06/20  Yes Starkes-Perry, Juel Burrow, FNP  ?atorvastatin (LIPITOR) 10 MG tablet Take 1 tablet (10 mg total) by mouth daily at 6 PM. ?Patient taking  differently: Take 10 mg by mouth every evening. 02/05/20  Yes Starkes-Perry, Juel Burrow, FNP  ?divalproex (DEPAKOTE ER) 500 MG 24 hr tablet Take 500 mg by mouth in the morning and at bedtime.   Yes [provider]  ?empagliflozin (JARDIANCE) 10 MG TABS tablet Take 10 mg by mouth daily.  06/11/20  Yes [provider]  ?insulin glargine (LANTUS) 100 UNIT/ML injection Inject 0.05 mLs (5 Units total) into the skin daily. ?Patient taking differently: Inject 5-6 Units into the skin See admin instructions. Inject 5 units into the skin before breakfast if BGL under 155 and 6 units is BGL 156 or greater 03/20/22  Yes Regalado, Belkys A, MD  ?torsemide (DEMADEX) 20 MG tablet Take 1 tablet (20 mg total) by mouth daily. 03/16/22  Yes Calvert Cantor, MD  ?buPROPion (WELLBUTRIN) 75 MG tablet Take 1 tablet (75 mg total) by mouth daily. 02/06/20   Maryagnes Amos, FNP  ?liothyronine (CYTOMEL) 5 MCG tablet Take 10 mcg by mouth daily.    [provider]  ?metoprolol succinate (TOPROL-XL) 50 MG 24 hr tablet Take 1 tablet (50 mg total) by mouth daily. Take with or immediately following a meal. 01/28/22 03/13/23  Marinda Elk, MD  ?mirtazapine (REMERON) 15 MG tablet Take 15 mg by mouth at bedtime. 05/01/21   [provider]  ?polyethylene glycol powder (GLYCOLAX/MIRALAX) 17 GM/SCOOP powder Take 17 g by mouth daily. ?Patient taking differently: Take 17 g by mouth daily as needed for mild constipation. 10/14/20   Ghimire, Werner Lean, MD  ?QUEtiapine (SEROQUEL) 25 MG tablet Take 0.5 tablets (12.5 mg total) by mouth at bedtime. 03/16/22   Calvert Cantor, MD  ?rivaroxaban (XARELTO) 20 MG TABS tablet Take 1 tablet (20 mg total) by mouth daily with supper. 04/02/20   Dhungel, Theda Belfast, MD  ? ? ?Physical Exam: ?Constitutional: Moderately built and nourished. ?Vitals:  ? 03/30/22 1948 03/30/22 2130 03/30/22 2143 03/30/22 2200  ?BP: 94/66 (!) 145/92 (!) 141/89 131/74  ?Pulse: 82 86 97 99  ?Resp: 20  (!) 25 (!) 23   ?Temp:      ?TempSrc:      ?SpO2: 99% 100% 100% 100%  ? ?Eyes: Anicteric no pallor. ?ENMT: No discharge from the ears eyes nose or mouth. ?Neck: No mass felt.  No neck rigidity. ?Respiratory: No rhonchi or crepitations. ?Cardiovascular: S1-S2 heard. ?Abdomen: Soft nontender bowel sound present. ?Musculoskeletal: Bilateral lower extremity edema present.  Poor pulses. ?Skin: Sacral decubitus ulcer stage III. ?Neurologic: Alert awake oriented to his name place and person moving all extremities but generally weak. ?Psychiatric: Appears normal per normal affect. ? ? ?Labs on Admission: I have personally reviewed following labs and imaging studies ? ?CBC: ?Recent Labs  ?Lab 03/30/22 ?1833  ?WBC 6.6  ?NEUTROABS 4.5  ?HGB 14.9  ?HCT 46.7  ?MCV 83.1  ?PLT 297  ? ?Basic Metabolic Panel: ?Recent Labs  ?Lab 03/30/22 ?1833  ?NA 134*  ?K 6.5*  ?  CL 100  ?CO2 24  ?GLUCOSE 65*  ?BUN 76*  ?CREATININE 1.98*  ?CALCIUM 9.5  ? ?GFR: ?Estimated Creatinine Clearance: 46.2 mL/min (A) (by C-G formula based on SCr of 1.98 mg/dL (H)). ?Liver Function Tests: ?Recent Labs  ?Lab 03/30/22 ?1833  ?AST 45*  ?ALT 21  ?ALKPHOS 77  ?BILITOT 1.4*  ?PROT 7.4  ?ALBUMIN 2.8*  ? ?No results for input(s): LIPASE, AMYLASE in the last 168 hours. ?No results for input(s): AMMONIA in the last 168 hours. ?Coagulation Profile: ?Recent Labs  ?Lab 03/30/22 ?1833  ?INR 2.2*  ? ?Cardiac Enzymes: ?Recent Labs  ?Lab 03/30/22 ?1833  ?CKTOTAL 820*  ? ?BNP (last 3 results) ?No results for input(s): PROBNP in the last 8760 hours. ?HbA1C: ?No results for input(s): HGBA1C in the last 72 hours. ?CBG: ?Recent Labs  ?Lab 03/30/22 ?2037 03/30/22 ?2059 03/30/22 ?2114 03/30/22 ?2138 03/30/22 ?2150  ?GLUCAP 31* 22* 23* 203* 247*  ? ?Lipid Profile: ?No results for input(s): CHOL, HDL, LDLCALC, TRIG, CHOLHDL, LDLDIRECT in the last 72 hours. ?Thyroid Function Tests: ?No results for input(s): TSH, T4TOTAL, FREET4, T3FREE, THYROIDAB in the last 72 hours. ?Anemia Panel: ?No results for  input(s): VITAMINB12, FOLATE, FERRITIN, TIBC, IRON, RETICCTPCT in the last 72 hours. ?Urine analysis: ?   ?Component Value Date/Time  ? COLORURINE YELLOW 03/17/2022 1538  ? APPEARANCEUR CLEAR 03/17/2022 1538  ? A

## 2022-03-30 NOTE — ED Provider Notes (Signed)
?[facility] EMERGENCY DEPARTMENT ?Provider Note ? ? ?CSN: [id number] ?Arrival date & time: 03/30/22  1704 ? ?  ? ?History ? ?Chief Complaint  ?Patient presents with  ? Weakness  ? ? ?Christian [name] is a 71 y.o. male. ? ?HPI ? ?  ?Pt presents today for bilateral leg weakness. Pt has a hx of chronic bilateral lower leg edema and severe bilateral tibial artery occlusive disease. He presents today for weakness in bilateral legs so severe that he has not able to walk or stand for a couple of days. Pt slid down from his chair at home and had to call EMS.  ? ?Report from EMS states that pt could not stand or walk per PT at 3:00 p.m. but could walk and stand when family last saw him at 1:30. EMS had to help him get up from slipping due to his legs being weak underneath him.  ? ?Pt states that for the past couple of weeks his legs has been feeling weak and heavier than normal. Recent hospital stay for hyperglycemia, infection at [facility] d/c 03/16/22 and he was discharged to rehab, came home last Tuesday, and states he was not walking well when he left.  Denies SOB, chest pain, nausea, vomiting, diarrhea, recent travel, recent infection, night sweats, or unintentional weight loss. ? ?Spoke with daughter - alleges that pt has developed a new pressure ulcer. ?[facility] has been visiting at home since d/c. ? ?Home Medications ?Prior to Admission medications   ?Medication Sig Start Date End Date Taking? Authorizing Provider  ?acetaminophen (TYLENOL) 500 MG tablet Take 500-1,000 mg by mouth every 6 (six) hours as needed (pain.).   Yes [provider]  ?ARIPiprazole (ABILIFY) 20 MG tablet Take 1 tablet (20 mg total) by mouth daily. 02/06/20  Yes Starkes-Perry, Juel Burrow, FNP  ?atorvastatin (LIPITOR) 10 MG tablet Take 1 tablet (10 mg total) by mouth daily at 6 PM. ?Patient taking differently: Take 10 mg by mouth every evening. 02/05/20  Yes Starkes-Perry, Juel Burrow, FNP  ?divalproex (DEPAKOTE ER) 500 MG 24  hr tablet Take 500 mg by mouth in the morning and at bedtime.   Yes [provider]  ?empagliflozin (JARDIANCE) 10 MG TABS tablet Take 10 mg by mouth daily.  06/11/20  Yes [provider]  ?insulin glargine (LANTUS) 100 UNIT/ML injection Inject 0.05 mLs (5 Units total) into the skin daily. ?Patient taking differently: Inject 5-6 Units into the skin See admin instructions. Inject 5 units into the skin before breakfast if BGL under 155 and 6 units is BGL 156 or greater 03/20/22  Yes Regalado, Belkys A, MD  ?torsemide (DEMADEX) 20 MG tablet Take 1 tablet (20 mg total) by mouth daily. 03/16/22  Yes Calvert Cantor, MD  ?buPROPion (WELLBUTRIN) 75 MG tablet Take 1 tablet (75 mg total) by mouth daily. 02/06/20   Maryagnes Amos, FNP  ?liothyronine (CYTOMEL) 5 MCG tablet Take 10 mcg by mouth daily.    [provider]  ?metoprolol succinate (TOPROL-XL) 50 MG 24 hr tablet Take 1 tablet (50 mg total) by mouth daily. Take with or immediately following a meal. 01/28/22 03/13/23  Marinda Elk, MD  ?mirtazapine (REMERON) 15 MG tablet Take 15 mg by mouth at bedtime. 05/01/21   [provider]  ?polyethylene glycol powder (GLYCOLAX/MIRALAX) 17 GM/SCOOP powder Take 17 g by mouth daily. ?Patient taking differently: Take 17 g by mouth daily as needed for mild constipation. 10/14/20   Ghimire, Werner Lean, MD  ?  QUEtiapine (SEROQUEL) 25 MG tablet Take 0.5 tablets (12.5 mg total) by mouth at bedtime. 03/16/22   Calvert Cantor, MD  ?rivaroxaban (XARELTO) 20 MG TABS tablet Take 1 tablet (20 mg total) by mouth daily with supper. 04/02/20   Dhungel, Theda Belfast, MD  ?   ? ?Allergies    ?Amlodipine, Onglyza [saxagliptin], Zyprexa [olanzapine], and Ritalin [methylphenidate]   ? ?Review of Systems   ?Review of Systems  ?All other systems reviewed and are negative. ? ?Physical Exam ?Updated Vital Signs ?BP 94/66   Pulse 82   Temp 97.7 ?F (36.5 ?C) (Oral)   Resp 20   SpO2 99%  ?Physical Exam ?Vitals and nursing  note reviewed.  ?Constitutional:   ?   Appearance: He is well-developed.  ?   Comments: Somnolent  ?HENT:  ?   Head: Atraumatic.  ?Eyes:  ?   Extraocular Movements: Extraocular movements intact.  ?   Pupils: Pupils are equal, round, and reactive to light.  ?Cardiovascular:  ?   Rate and Rhythm: Normal rate.  ?Pulmonary:  ?   Effort: Pulmonary effort is normal.  ?Musculoskeletal:  ?   Cervical back: Neck supple.  ?Skin: ?   General: Skin is warm.  ?   Comments: Bilateral lower extremity swelling, punctate hyperpigmented lesion over the left toes - ?  Dry gangrene, no palpable pulses, skin is warm to touch over both feet  ?Neurological:  ?   Mental Status: He is oriented to person, place, and time.  ? ? ?ED Results / Procedures / Treatments   ?Labs ?(all labs ordered are listed, but only abnormal results are displayed) ?Labs Reviewed  ?CBC WITH DIFFERENTIAL/PLATELET - Abnormal; Notable for the following components:  ?    Result Value  ? RDW 17.5 (*)   ? All other components within normal limits  ?COMPREHENSIVE METABOLIC PANEL - Abnormal; Notable for the following components:  ? Sodium 134 (*)   ? Potassium 6.5 (*)   ? Glucose, Bld 65 (*)   ? BUN 76 (*)   ? Creatinine, Ser 1.98 (*)   ? Albumin 2.8 (*)   ? AST 45 (*)   ? Total Bilirubin 1.4 (*)   ? GFR, Estimated 36 (*)   ? All other components within normal limits  ?PROTIME-INR - Abnormal; Notable for the following components:  ? Prothrombin Time 24.5 (*)   ? INR 2.2 (*)   ? All other components within normal limits  ?CK - Abnormal; Notable for the following components:  ? Total CK 820 (*)   ? All other components within normal limits  ?CBG MONITORING, ED - Abnormal; Notable for the following components:  ? Glucose-Capillary 31 (*)   ? All other components within normal limits  ?CBG MONITORING, ED - Abnormal; Notable for the following components:  ? Glucose-Capillary 22 (*)   ? All other components within normal limits  ?CBG MONITORING, ED - Abnormal; Notable for the  following components:  ? Glucose-Capillary 23 (*)   ? All other components within normal limits  ?TROPONIN I (HIGH SENSITIVITY) - Abnormal; Notable for the following components:  ? Troponin I (High Sensitivity) 19 (*)   ? All other components within normal limits  ?VALPROIC ACID LEVEL  ?CBG MONITORING, ED  ?TROPONIN I (HIGH SENSITIVITY)  ? ? ?EKG ?EKG Interpretation ? ?Date/Time:  Tuesday Mar 30 2022 20:55:51 EDT ?Ventricular Rate:  86 ?PR Interval:  181 ?QRS Duration: 104 ?QT Interval:  374 ?QTC Calculation: 448 ?R Axis:   -61 ?  Text Interpretation: Sinus rhythm Anterolateral infarct, age indeterminate No acute changes No significant change since last tracing Confirmed by Derwood Kaplan [phone]) on 03/30/2022 9:00:20 PM ? ?Radiology ?No results found. ? ?Procedures ?Marland KitchenCritical Care ?Performed by: Derwood Kaplan, MD ?Authorized by: Derwood Kaplan, MD  ? ?Critical care provider statement:  ?  Critical care time (minutes):  82 ?  Critical care was necessary to treat or prevent imminent or life-threatening deterioration of the following conditions:  Dehydration, circulatory failure and renal failure ?  Critical care was time spent personally by me on the following activities:  Development of treatment plan with patient or surrogate, discussions with consultants, evaluation of patient's response to treatment, examination of patient, ordering and review of laboratory studies, ordering and review of radiographic studies, ordering and performing treatments and interventions, pulse oximetry, re-evaluation of patient's condition and review of old charts  ? ? ?Medications Ordered in ED ?Medications  ?insulin aspart (novoLOG) injection 5 Units (5 Units Intravenous Not Given 03/30/22 2045)  ?  And  ?dextrose 50 % solution 50 mL (50 mLs Intravenous Given 03/30/22 2040)  ?dextrose 10 % infusion ( Intravenous New Bag/Given 03/30/22 2044)  ?calcium gluconate 1 g/ 50 mL sodium chloride IVPB (1,000 mg Intravenous New Bag/Given 03/30/22 2051)   ?dextrose 50 % solution 50 mL (has no administration in time range)  ?dextrose 10 % infusion (has no administration in time range)  ?sodium bicarbonate injection 50 mEq (50 mEq Intravenous Given 03/30/22 2052)

## 2022-03-30 NOTE — ED Notes (Signed)
IV team at bedside 

## 2022-03-30 NOTE — ED Notes (Signed)
Date and time results received: 03/30/22  ?(use smartphrase ".now" to insert current time) ? ?Test: potassium  ?Critical Value: 6.5 ? ?Name of Provider Notified: Rhunette Croft ? ?Orders Received? Or Actions Taken?:  N/A  ?

## 2022-03-30 NOTE — ED Notes (Signed)
Attempted IV with no success. IV team consult placed.  

## 2022-03-30 NOTE — ED Triage Notes (Signed)
Pt BIB [facility] from home because he cannot stand or walk per PT that was at the house at 1500 for rehab.  EMS endorses that family states that pt could stand to transfer as of  1330.  Pt required medic and 3 fireman to transfer to EMS stretcher at home.  ? ?PT was admitted 3wks ago to [facility] for hyperglycemia per EMS and was not walking well upon DC.  Pt spent a couple of days at a rehab facility in [place] and was DC home. EMS states sores in perineal area around contact points with we brief. ? ?EMS vitals 112/84, 100% RA, HR 100 CBG 119 ?

## 2022-03-30 NOTE — ED Notes (Signed)
Pt given two glasses of juice.  ?

## 2022-03-31 ENCOUNTER — Observation Stay (HOSPITAL_COMMUNITY): Payer: Medicare HMO

## 2022-03-31 DIAGNOSIS — G210 Malignant neuroleptic syndrome: Secondary | ICD-10-CM | POA: Diagnosis not present

## 2022-03-31 DIAGNOSIS — G9340 Encephalopathy, unspecified: Secondary | ICD-10-CM | POA: Diagnosis not present

## 2022-03-31 DIAGNOSIS — L89159 Pressure ulcer of sacral region, unspecified stage: Secondary | ICD-10-CM | POA: Diagnosis not present

## 2022-03-31 DIAGNOSIS — R410 Disorientation, unspecified: Secondary | ICD-10-CM | POA: Diagnosis not present

## 2022-03-31 DIAGNOSIS — I509 Heart failure, unspecified: Secondary | ICD-10-CM | POA: Diagnosis not present

## 2022-03-31 DIAGNOSIS — I3139 Other pericardial effusion (noninflammatory): Secondary | ICD-10-CM | POA: Diagnosis not present

## 2022-03-31 DIAGNOSIS — G252 Other specified forms of tremor: Secondary | ICD-10-CM | POA: Diagnosis not present

## 2022-03-31 DIAGNOSIS — M75102 Unspecified rotator cuff tear or rupture of left shoulder, not specified as traumatic: Secondary | ICD-10-CM | POA: Diagnosis present

## 2022-03-31 DIAGNOSIS — E1122 Type 2 diabetes mellitus with diabetic chronic kidney disease: Secondary | ICD-10-CM | POA: Diagnosis not present

## 2022-03-31 DIAGNOSIS — R339 Retention of urine, unspecified: Secondary | ICD-10-CM | POA: Diagnosis not present

## 2022-03-31 DIAGNOSIS — I700 Atherosclerosis of aorta: Secondary | ICD-10-CM | POA: Diagnosis not present

## 2022-03-31 DIAGNOSIS — I739 Peripheral vascular disease, unspecified: Secondary | ICD-10-CM | POA: Diagnosis not present

## 2022-03-31 DIAGNOSIS — E8809 Other disorders of plasma-protein metabolism, not elsewhere classified: Secondary | ICD-10-CM | POA: Diagnosis not present

## 2022-03-31 DIAGNOSIS — E119 Type 2 diabetes mellitus without complications: Secondary | ICD-10-CM | POA: Diagnosis not present

## 2022-03-31 DIAGNOSIS — M6289 Other specified disorders of muscle: Secondary | ICD-10-CM | POA: Diagnosis not present

## 2022-03-31 DIAGNOSIS — N1831 Chronic kidney disease, stage 3a: Secondary | ICD-10-CM | POA: Diagnosis not present

## 2022-03-31 DIAGNOSIS — R531 Weakness: Secondary | ICD-10-CM | POA: Diagnosis present

## 2022-03-31 DIAGNOSIS — E039 Hypothyroidism, unspecified: Secondary | ICD-10-CM | POA: Diagnosis present

## 2022-03-31 DIAGNOSIS — L89154 Pressure ulcer of sacral region, stage 4: Secondary | ICD-10-CM | POA: Diagnosis not present

## 2022-03-31 DIAGNOSIS — R627 Adult failure to thrive: Secondary | ICD-10-CM | POA: Diagnosis present

## 2022-03-31 DIAGNOSIS — R509 Fever, unspecified: Secondary | ICD-10-CM | POA: Diagnosis not present

## 2022-03-31 DIAGNOSIS — R7881 Bacteremia: Secondary | ICD-10-CM | POA: Diagnosis not present

## 2022-03-31 DIAGNOSIS — E1165 Type 2 diabetes mellitus with hyperglycemia: Secondary | ICD-10-CM | POA: Diagnosis not present

## 2022-03-31 DIAGNOSIS — I130 Hypertensive heart and chronic kidney disease with heart failure and stage 1 through stage 4 chronic kidney disease, or unspecified chronic kidney disease: Secondary | ICD-10-CM | POA: Diagnosis not present

## 2022-03-31 DIAGNOSIS — M7989 Other specified soft tissue disorders: Secondary | ICD-10-CM | POA: Diagnosis not present

## 2022-03-31 DIAGNOSIS — M79642 Pain in left hand: Secondary | ICD-10-CM | POA: Diagnosis not present

## 2022-03-31 DIAGNOSIS — G9341 Metabolic encephalopathy: Secondary | ICD-10-CM | POA: Diagnosis not present

## 2022-03-31 DIAGNOSIS — I2510 Atherosclerotic heart disease of native coronary artery without angina pectoris: Secondary | ICD-10-CM | POA: Diagnosis not present

## 2022-03-31 DIAGNOSIS — E1140 Type 2 diabetes mellitus with diabetic neuropathy, unspecified: Secondary | ICD-10-CM | POA: Diagnosis not present

## 2022-03-31 DIAGNOSIS — A409 Streptococcal sepsis, unspecified: Secondary | ICD-10-CM | POA: Diagnosis not present

## 2022-03-31 DIAGNOSIS — R748 Abnormal levels of other serum enzymes: Secondary | ICD-10-CM | POA: Diagnosis not present

## 2022-03-31 DIAGNOSIS — I672 Cerebral atherosclerosis: Secondary | ICD-10-CM | POA: Diagnosis not present

## 2022-03-31 DIAGNOSIS — F3189 Other bipolar disorder: Secondary | ICD-10-CM | POA: Diagnosis not present

## 2022-03-31 DIAGNOSIS — H0520 Unspecified exophthalmos: Secondary | ICD-10-CM | POA: Diagnosis not present

## 2022-03-31 DIAGNOSIS — R6520 Severe sepsis without septic shock: Secondary | ICD-10-CM | POA: Diagnosis not present

## 2022-03-31 DIAGNOSIS — Z6841 Body Mass Index (BMI) 40.0 and over, adult: Secondary | ICD-10-CM | POA: Diagnosis not present

## 2022-03-31 DIAGNOSIS — I5032 Chronic diastolic (congestive) heart failure: Secondary | ICD-10-CM | POA: Diagnosis not present

## 2022-03-31 DIAGNOSIS — N179 Acute kidney failure, unspecified: Secondary | ICD-10-CM | POA: Diagnosis not present

## 2022-03-31 DIAGNOSIS — I5033 Acute on chronic diastolic (congestive) heart failure: Secondary | ICD-10-CM | POA: Diagnosis not present

## 2022-03-31 DIAGNOSIS — I480 Paroxysmal atrial fibrillation: Secondary | ICD-10-CM | POA: Diagnosis present

## 2022-03-31 DIAGNOSIS — L89152 Pressure ulcer of sacral region, stage 2: Secondary | ICD-10-CM | POA: Diagnosis not present

## 2022-03-31 DIAGNOSIS — Z515 Encounter for palliative care: Secondary | ICD-10-CM | POA: Diagnosis not present

## 2022-03-31 DIAGNOSIS — R0602 Shortness of breath: Secondary | ICD-10-CM | POA: Diagnosis not present

## 2022-03-31 DIAGNOSIS — J9811 Atelectasis: Secondary | ICD-10-CM | POA: Diagnosis present

## 2022-03-31 DIAGNOSIS — E875 Hyperkalemia: Secondary | ICD-10-CM | POA: Diagnosis not present

## 2022-03-31 DIAGNOSIS — I083 Combined rheumatic disorders of mitral, aortic and tricuspid valves: Secondary | ICD-10-CM | POA: Diagnosis not present

## 2022-03-31 DIAGNOSIS — S4992XA Unspecified injury of left shoulder and upper arm, initial encounter: Secondary | ICD-10-CM | POA: Diagnosis not present

## 2022-03-31 DIAGNOSIS — A411 Sepsis due to other specified staphylococcus: Secondary | ICD-10-CM | POA: Diagnosis not present

## 2022-03-31 DIAGNOSIS — M6282 Rhabdomyolysis: Secondary | ICD-10-CM | POA: Diagnosis not present

## 2022-03-31 DIAGNOSIS — Z7189 Other specified counseling: Secondary | ICD-10-CM | POA: Diagnosis not present

## 2022-03-31 DIAGNOSIS — I110 Hypertensive heart disease with heart failure: Secondary | ICD-10-CM | POA: Diagnosis not present

## 2022-03-31 DIAGNOSIS — T797XXA Traumatic subcutaneous emphysema, initial encounter: Secondary | ICD-10-CM | POA: Diagnosis not present

## 2022-03-31 DIAGNOSIS — E11649 Type 2 diabetes mellitus with hypoglycemia without coma: Secondary | ICD-10-CM | POA: Diagnosis not present

## 2022-03-31 DIAGNOSIS — Z9989 Dependence on other enabling machines and devices: Secondary | ICD-10-CM | POA: Diagnosis not present

## 2022-03-31 DIAGNOSIS — F319 Bipolar disorder, unspecified: Secondary | ICD-10-CM | POA: Diagnosis present

## 2022-03-31 DIAGNOSIS — L89150 Pressure ulcer of sacral region, unstageable: Secondary | ICD-10-CM | POA: Diagnosis not present

## 2022-03-31 DIAGNOSIS — Z4682 Encounter for fitting and adjustment of non-vascular catheter: Secondary | ICD-10-CM | POA: Diagnosis not present

## 2022-03-31 DIAGNOSIS — E162 Hypoglycemia, unspecified: Secondary | ICD-10-CM | POA: Diagnosis not present

## 2022-03-31 DIAGNOSIS — I70203 Unspecified atherosclerosis of native arteries of extremities, bilateral legs: Secondary | ICD-10-CM | POA: Diagnosis present

## 2022-03-31 DIAGNOSIS — A419 Sepsis, unspecified organism: Secondary | ICD-10-CM | POA: Diagnosis not present

## 2022-03-31 DIAGNOSIS — R1310 Dysphagia, unspecified: Secondary | ICD-10-CM | POA: Diagnosis not present

## 2022-03-31 DIAGNOSIS — G4733 Obstructive sleep apnea (adult) (pediatric): Secondary | ICD-10-CM | POA: Diagnosis present

## 2022-03-31 LAB — CBG MONITORING, ED
Glucose-Capillary: 107 mg/dL — ABNORMAL HIGH (ref 70–99)
Glucose-Capillary: 118 mg/dL — ABNORMAL HIGH (ref 70–99)
Glucose-Capillary: 120 mg/dL — ABNORMAL HIGH (ref 70–99)
Glucose-Capillary: 122 mg/dL — ABNORMAL HIGH (ref 70–99)
Glucose-Capillary: 130 mg/dL — ABNORMAL HIGH (ref 70–99)
Glucose-Capillary: 136 mg/dL — ABNORMAL HIGH (ref 70–99)
Glucose-Capillary: 140 mg/dL — ABNORMAL HIGH (ref 70–99)
Glucose-Capillary: 177 mg/dL — ABNORMAL HIGH (ref 70–99)
Glucose-Capillary: 186 mg/dL — ABNORMAL HIGH (ref 70–99)
Glucose-Capillary: 190 mg/dL — ABNORMAL HIGH (ref 70–99)
Glucose-Capillary: 211 mg/dL — ABNORMAL HIGH (ref 70–99)
Glucose-Capillary: 227 mg/dL — ABNORMAL HIGH (ref 70–99)
Glucose-Capillary: 261 mg/dL — ABNORMAL HIGH (ref 70–99)

## 2022-03-31 LAB — CBC
HCT: 39.6 % (ref 39.0–52.0)
Hemoglobin: 12.8 g/dL — ABNORMAL LOW (ref 13.0–17.0)
MCH: 26.7 pg (ref 26.0–34.0)
MCHC: 32.3 g/dL (ref 30.0–36.0)
MCV: 82.5 fL (ref 80.0–100.0)
Platelets: 227 10*3/uL (ref 150–400)
RBC: 4.8 MIL/uL (ref 4.22–5.81)
RDW: 17 % — ABNORMAL HIGH (ref 11.5–15.5)
WBC: 4.6 10*3/uL (ref 4.0–10.5)
nRBC: 0 % (ref 0.0–0.2)

## 2022-03-31 LAB — BASIC METABOLIC PANEL
Anion gap: 6 (ref 5–15)
BUN: 66 mg/dL — ABNORMAL HIGH (ref 8–23)
CO2: 27 mmol/L (ref 22–32)
Calcium: 9.1 mg/dL (ref 8.9–10.3)
Chloride: 101 mmol/L (ref 98–111)
Creatinine, Ser: 1.86 mg/dL — ABNORMAL HIGH (ref 0.61–1.24)
GFR, Estimated: 39 mL/min — ABNORMAL LOW (ref >60)
Glucose, Bld: 223 mg/dL — ABNORMAL HIGH (ref 70–99)
Potassium: 4.5 mmol/L (ref 3.5–5.1)
Sodium: 134 mmol/L — ABNORMAL LOW (ref 135–145)

## 2022-03-31 LAB — TSH: TSH: 0.718 u[IU]/mL (ref 0.350–4.500)

## 2022-03-31 LAB — GLUCOSE, CAPILLARY: Glucose-Capillary: 231 mg/dL — ABNORMAL HIGH (ref 70–99)

## 2022-03-31 LAB — CK: Total CK: 869 U/L — ABNORMAL HIGH (ref 49–397)

## 2022-03-31 NOTE — ED Notes (Signed)
Pt set up for lunch

## 2022-03-31 NOTE — Consult Note (Signed)
WOC Nurse Consult Note: ?Reason for Consult:Pressure ulcer ?Wound type:Pressure injury (Stage 3) to buttocks and coccygeal area and associated periwound irritant contact dermatis ? ?ICD-10 CM Codes for Irritant Dermatitis ? ?QH:879361 - Due to friction or contact with other specified body fluids ?L30.4  - Erythema intertrigo. Also used for abrasion of the hand, chafing of the skin, dermatitis due to sweating and friction, friction dermatitis, friction eczema, and genital/thigh intertrigo.  ? ?Pressure Injury POA: Yes ?Measurement:5cm x 6cm area with 0.2cm depth. ?Wound bed: 100% red, moist tissue ?Drainage (amount, consistency, odor) small to moderate serous ?Periwound: Macerated with partial thickness areas of skin loss surrounding from contact with wound exudate and friction during repositioning. Moisture collection in gluteal cleft contributing to ICD. ?Dressing procedure/placement/frequency: I will provide the patient with a mattress replacement with low air loss feature to mitigate microclimate and provide guidance for Nursing to turn and reposition and minimize time in the supine position while in bed. Previous wounds of the LE are healed, albeit LEs remain edematous. I will provide bilateral pressure redistribution heel boots for pI prevention. Topical care to the coccygeal area will be with daily cleanse followed by placement of a double layer of xeroform gauze (antimicrobial nonadherent, astringent) topped with dry gauze and covered with silicone foam.  ? ?[place] nursing team will not follow, but will remain available to this patient, the nursing and medical teams.  Please re-consult if needed. ?Thanks, ?Maudie Flakes, MSN, RN, [place], [place], CWON-AP, [place]  ?Pager# [phone]  ? ? ? ?  ?

## 2022-03-31 NOTE — ED Notes (Signed)
Pt has been slowly becoming more alert and responsive since 1100. He is able to answer my questions, follow commands, hold conversation. He calls out for needs including bathroom and re-adjustment. He is eating his meals and requesting snacks.  ?

## 2022-03-31 NOTE — Progress Notes (Addendum)
?PROGRESS NOTE ? ? ? ?Christian [name]  [id number] DOB: 18-Feb-1951 DOA: 03/30/2022 ?PCP: Donald Prose, MD  ? ? ?Chief Complaint  ?Patient presents with  ? Weakness  ? ? ?Brief Narrative:  ? ? ?HPI: Christian [name] is a 71 y.o. male with history of peripheral arterial disease, bipolar disorder, diastolic CHF, chronic kidney disease, diabetes mellitus, sleep apnea, atrial fibrillation who was recently admitted for acute renal failure with hypoglycemia cellulitis eventually discharged to rehab and was discharged from rehab last week has been unable to ambulate and was feeling generally weak was brought to the ER.  Patient states since his last admission he has not ambulated.  Also has developed sacral decubitus ulcers.  Denies chest pain abdominal pain nausea vomiting patient states he has been eating well. ?  ?ED Course: In the ER patient is found to be hypoglycemic with blood sugar as low as 30s.  Patient lab work also show hyperkalemia with potassium of 6.5.  Creatinine is 1.9.  CK level is 8 and 20.  On exam patient does have a sacral decubitus ulcer which does not look infected.  Has poor pulses in the lower extremity but no acute ischemic changes.  Patient was given Lokelma calcium gluconate and sodium bicarbonate.  Patient was started on D10 and D50 for hypoglycemia admitted for further work-up. ?  ? ?Assessment & Plan: ?  ?Principal Problem: ?  Hyperkalemia ?Active Problems: ?  Diabetes mellitus type 2 in nonobese [facility]) ?  Sacral decubitus ulcer, stage II ([place]) ?  Hypoglycemia ? ? ? ?Hypoglycemia ?-rpatient has had hypoglycemia during prior admission and patient states he has been eating well.  ?- Last hemoglobin A1c 2 weeks ago was 5.6. ?-Follow On cortisol level. ?-Continue with D10 W at 74 cc/h, will try to taper once CBG has been consistently appropriate once oral intake and appetite has improved ?-Monitor CBG closely ? ?Hyperkalemia  ?- with history of chronic kidney disease stage III we will get  dietitian consult for renal diet.  Patient received sodium bicarbonate Lokelma and calcium gluconate.   ?-Most recent potassium appropriate at 4.5 ? ?AKI in CKD stage III A with mild rhabdomyolysis ?-Is more elevated than baseline, baseline creatinine 1.4 on recent discharge, elevated 1.98 on this admission, continue with IV fluids and avoid nephrotoxic medications. ?-As well total CK mildly elevated at 869, continue with IV fluids ? ?Generalized weakness with failure to thrive will likely need rehab.  We will get physical therapy consult social work consult. ? ?Peripheral vascular disease ?-  will need follow-up with vascular surgery. ? ?A-fib rate controlled on Xarelto. ? ?History of bipolar disorder on Depakote Abilify Seroquel. ? ?History of diastolic dysfunction presently receiving fluids. ? ?History of sleep apnea on CPAP. ? ?Sacral decubitus ulcer stage III does not look infected, wound care consulted ? ?Mildly elevated CK levels will hold statins for now.  Recheck CK levels after hydration. ? ?Hypothyroidism on Cytomel. ?  ? ?DVT prophylaxis: Xarelto ?Code Status: Full ?Family Communication: None at [place] ?Disposition:  ? ?Status is: Inpatient ?Remains inpatient appropriate because: Hypoglycemic, on D10, ?  ?Consultants:  ?None ? ? ?Subjective: ? ?Patient was initially somnolent, less interactive, this has improved, and his appetite has been improving per staff ? ?Objective: ?Vitals:  ? 03/31/22 1130 03/31/22 1200 03/31/22 1230 03/31/22 1330  ?BP: 106/68 113/74 112/70 (!) 125/103  ?Pulse:  71  95  ?Resp: 17 18 (!) 21 (!) 21  ?Temp:      ?TempSrc:      ?  SpO2:    100%  ? ?No intake or output data in the 24 hours ending 03/31/22 1551 ?There were no vitals filed for this visit. ? ?Examination: ? ?Somnolent, but wakes up, frail, deconditioned ?Symmetrical Chest wall movement, Good air movement bilaterally, CTAB ?RRR,No Gallops,Rubs or new Murmurs, No Parasternal Heave ?+ve B.Sounds, Abd Soft, No tenderness, No  rebound - guarding or rigidity. ?No Cyanosis, Clubbing or edema, No new Rash or bruise   ? ? ? ? ?Data Reviewed: I have personally reviewed following labs and imaging studies ? ?CBC: ?Recent Labs  ?Lab 03/30/22 ?[id number] 03/31/22 ?0205  ?WBC 6.6 4.6  ?NEUTROABS 4.5  --   ?HGB 14.9 12.8*  ?HCT 46.7 39.6  ?MCV 83.1 82.5  ?PLT 297 227  ? ? ?Basic Metabolic Panel: ?Recent Labs  ?Lab 03/30/22 ?[id number] 03/31/22 ?0205  ?NA 134* 134*  ?K 6.5* 4.5  ?CL 100 101  ?CO2 24 27  ?GLUCOSE 65* 223*  ?BUN 76* 66*  ?CREATININE 1.98* 1.86*  ?CALCIUM 9.5 9.1  ? ? ?GFR: ?Estimated Creatinine Clearance: 49.1 mL/min (A) (by C-G formula based on SCr of 1.86 mg/dL (H)). ? ?Liver Function Tests: ?Recent Labs  ?Lab 03/30/22 ?1833  ?AST 45*  ?ALT 21  ?ALKPHOS 77  ?BILITOT 1.4*  ?PROT 7.4  ?ALBUMIN 2.8*  ? ? ?CBG: ?Recent Labs  ?Lab 03/31/22 ?[id number] 03/31/22 ?[id number] 03/31/22 ?1029 03/31/22 ?1214 03/31/22 ?1537  ?GLUCAP 122* 130* 120* 118* 177*  ? ? ? ?No results found for this or any previous visit (from the past 240 hour(s)).  ? ? ? ? ? ?Radiology Studies: ?DG Pelvis 1-2 Views ? ?Result Date: 03/31/2022 ?CLINICAL DATA:  Bilateral leg weakness EXAM: PELVIS - 1 VIEW COMPARISON:  None Available. FINDINGS: Limited by portable technique with rotation. There is no evidence of pelvic fracture or diastasis. No pelvic bone lesions are seen. Subjective generalized osteopenia. Atheromatous calcification at the bilateral thigh. IMPRESSION: Negative limited pelvis radiograph. Electronically Signed   By: Jorje Guild M.D.   On: 03/31/2022 05:46  ? ?CT HEAD WO CONTRAST (5MM) ? ?Result Date: 03/31/2022 ?CLINICAL DATA:  Weakness and loss of ambulatory status. Stroke suspected. EXAM: CT HEAD WITHOUT CONTRAST TECHNIQUE: Contiguous axial images were obtained from the base of the skull through the vertex without intravenous contrast. RADIATION DOSE REDUCTION: This exam was performed according to the departmental dose-optimization program which includes automated exposure  control, adjustment of the mA and/or kV according to patient size and/or use of iterative reconstruction technique. COMPARISON:  Head CT 03/11/2022 FINDINGS: Brain: There is mild-to-moderate cerebral atrophy, small vessel disease and atrophic ventriculomegaly, without midline shift. There is mild cerebellar atrophy. No new asymmetry is seen worrisome for acute infarct, hemorrhage or mass. Basal cisterns are clear. There are benign dural calcifications in the frontal falx. Vascular: Both distal vertebral arteries and siphons are heavily calcified but no hyperdense central vessel is seen. Skull: The calvarium, skull base and orbits are intact. There are numerous cutaneous and subcutaneous calcifications in the scalp as well as vascular calcifications. No skull lesion is seen. Sinuses/Orbits: Orbital contents are unchanged in appearance with mild proptosis. Visualized sinuses and mastoid air cells are clear. Other: None. IMPRESSION: Chronic change. No acute intracranial CT findings or interval changes. Electronically Signed   By: Telford Nab M.D.   On: 03/31/2022 06:02   ? ? ? ? ? ?Scheduled Meds: ? ARIPiprazole  20 mg Oral Daily  ? buPROPion  75 mg Oral Daily  ? divalproex  500 mg Oral  BID  ? liothyronine  10 mcg Oral Daily  ? mirtazapine  15 mg Oral QHS  ? QUEtiapine  12.5 mg Oral QHS  ? rivaroxaban  20 mg Oral Q supper  ? ?Continuous Infusions: ? dextrose 75 mL/hr at 03/31/22 0601  ? ? ? LOS: 0 days  ? ? ? ? ? ?Phillips Climes, MD ?[facility] ? ? ?To contact the attending provider between 7A-7P or the covering provider during after hours 7P-7A, please log into the web site [email or web address] and access using universal [place] password for that web site. If you do not have the password, please call the hospital operator. ? ?03/31/2022, 3:51 PM  ?  ?

## 2022-03-31 NOTE — Progress Notes (Addendum)
Inpatient Diabetes Program Recommendations ? ?AACE/ADA: New Consensus Statement on Inpatient Glycemic Control (2015) ? ?Target Ranges:  Prepandial:   less than 140 mg/dL ?     Peak postprandial:   less than 180 mg/dL (1-2 hours) ?     Critically ill patients:  140 - 180 mg/dL  ? ?Lab Results  ?Component Value Date  ? GLUCAP 130 (H) 03/31/2022  ? HGBA1C 7.6 (H) 03/13/2022  ? ? ?Review of Glycemic Control ? Latest Reference Range & Units 03/31/22 03:14 03/31/22 04:08 03/31/22 05:11 03/31/22 05:57 03/31/22 06:52 03/31/22 08:18  ?Glucose-Capillary 70 - 99 mg/dL 258 (H) 527 (H) 782 (H) 136 (H) 122 (H) 130 (H)  ? ?Diabetes history: DM 2 ?Outpatient Diabetes medications:  ?Jardiance 10 mg daily, Lantus 5-6 units daily ?Current orders for Inpatient glycemic control:  ?D10 150 ml/hr ? ?Inpatient Diabetes Program Recommendations:   ? ?Note hypoglycemia.  Will follow.  May need to stop insulin at d/c for safety.  ? ?Thanks,  ?Beryl Meager, RN, BC-ADM ?Inpatient Diabetes Coordinator ?Pager [phone]  (8a-5p) ? ?1500- Note patient admitted with hypoglycemia.  Patient states that his Lantus was reduced to 6 units daily.  ? If patient may need to d/c insulin and maybe try more "glucose dependent" medication such as a GLP-1? Patient very sleepy and did not talk much with me.  Needs increased support at home with blood sugar management.  ? ? ?

## 2022-03-31 NOTE — Evaluation (Signed)
Physical Therapy Evaluation ?Patient Details ?Name: Christian [name] ?MRN: [id number] ?DOB: October 19, 1951 ?Today's Date: 03/31/2022 ? ?History of Present Illness ? 71 y.o. male presented 5/2 with worsening strength , new decubitus ulcers and poor intake.  Recent admission for cellulitis and hypoglycemia, had just returned to ALF from SNF a week ago but not OOB since then.  Has new hypoglyemia, elevation of K+, creatinine, CK.  Ulcer is not infected but has LE skin is also irritated and has skin breakdown.  PMHx:  chronic venous stasis and chronic right shin unhealing wound, chronic HFpEF, IDDM, CAD with stenting x2 in 2021, PVD, CKD stage IIIa, HTN, DVT on Xarelto, bipolar disorder, OSA on CPAP at bedtime  ?Clinical Impression ? Pt was seen for initial evaluation and is too weak for full mobility to be tolerated.  He is expecting to return to rehab after trying to move with PT, and did report he has been sliding to the floor in ALF at times.  Will focus on recovery of as much strength and balance as pt can tolerate, and will need to use care given his skin breakdown and painful L side.  Follow acutely for goals as are outlined below, with plan to get to a chair as he can tolerate if skin can be challenged this way.  SNF care recommended due to skin care and density of weakness that need to be addressed.  Pt is hoping to return to a home level of care.   ?   ? ?Recommendations for follow up therapy are one component of a multi-disciplinary discharge planning process, led by the attending physician.  Recommendations may be updated based on patient status, additional functional criteria and insurance authorization. ? ?Follow Up Recommendations Skilled nursing-short term rehab (<3 hours/day) ? ?  ?Assistance Recommended at Discharge Frequent or constant Supervision/Assistance  ?Patient can return home with the following ? Two people to help with walking and/or transfers;A lot of help with bathing/dressing/bathroom;Assistance  with cooking/housework;Assist for transportation;Help with stairs or ramp for entrance ? ?  ?Equipment Recommendations None recommended by PT  ?Recommendations for Other Services ?    ?  ?Functional Status Assessment Patient has had a recent decline in their functional status and demonstrates the ability to make significant improvements in function in a reasonable and predictable amount of time.  ? ?  ?Precautions / Restrictions Precautions ?Precautions: Fall ?Precaution Comments: monitor vitals and esp BP ?Restrictions ?Weight Bearing Restrictions: No  ? ?  ? ?Mobility ? Bed Mobility ?Overal bed mobility: Needs Assistance ?Bed Mobility: Supine to Sit, Sit to Supine ?  ?  ?Supine to sit: Total assist ?Sit to supine: Total assist ?  ?General bed mobility comments: pt is lethargic and giving a very low effort to move, weak and painful ?  ? ?Transfers ?  ?  ?  ?  ?  ?  ?  ?  ?  ?General transfer comment: unable to get pt to posture to try to stand ?  ? ?Ambulation/Gait ?  ?  ?  ?  ?  ?  ?  ?General Gait Details: unable ? ?Stairs ?  ?  ?  ?  ?  ? ?Wheelchair Mobility ?  ? ?Modified Rankin (Stroke Patients Only) ?  ? ?  ? ?Balance Overall balance assessment: Needs assistance, History of Falls ?  ?  ?  ?  ?  ?  ?  ?  ?  ?  ?  ?  ?  ?  ?  ?  ?  ?  ?   ? ? ? ?  Pertinent Vitals/Pain Pain Assessment ?Pain Assessment: Faces ?Faces Pain Scale: Hurts little more ?Pain Location: feet with any touching esp LLE ?Pain Descriptors / Indicators: Discomfort, Guarding ?Pain Intervention(s): Limited activity within patient's tolerance, Monitored during session, Repositioned  ? ? ?Home Living Family/patient expects to be discharged to:: Assisted living ?Living Arrangements: Other (Comment) (congregate care) ?Available Help at Discharge: Family;Available PRN/intermittently ?Type of Home: Assisted living ?Home Access: Level entry ?  ?  ?  ?Home Layout: One level ?Home Equipment: Rollator (4 wheels);Shower seat;Grab bars - toilet;Grab bars -  tub/shower;Wheelchair - manual;Other (comment) (wc is borrowed,  has Risk analyst) ?Additional Comments: asking for a hosptial bed  ?  ?Prior Function Prior Level of Function : Needs assist ?  ?  ?  ?Physical Assist : Mobility (physical) ?Mobility (physical): Bed mobility;Transfers;Gait ?  ?Mobility Comments: rolling walker with help at SNF but then no gait in ALF ?  ?  ? ? ?Hand Dominance  ? Dominant Hand: Right ? ?  ?Extremity/Trunk Assessment  ? Upper Extremity Assessment ?Upper Extremity Assessment: Generalized weakness ?  ? ?Lower Extremity Assessment ?Lower Extremity Assessment: Generalized weakness ?  ? ?Cervical / Trunk Assessment ?Cervical / Trunk Assessment: Other exceptions (pt is moving too poorly to comment on posture)  ?Communication  ? Communication: No difficulties  ?Cognition Arousal/Alertness: Lethargic ?Behavior During Therapy: Flat affect ?Overall Cognitive Status: Difficult to assess ?  ?  ?  ?  ?  ?  ?  ?  ?  ?  ?  ?  ?  ?  ?  ?  ?General Comments: pt is unable to assist with movement, hurts on L hand finger joints and L foot esp ?  ?  ? ?  ?General Comments General comments (skin integrity, edema, etc.): pt is too weak to fully sit on side of bed, requiring two person help to attempt to sit up ? ?  ?Exercises    ? ?Assessment/Plan  ?  ?PT Assessment Patient needs continued PT services  ?PT Problem List Decreased strength;Decreased activity tolerance;Decreased mobility;Decreased coordination;Decreased safety awareness;Decreased skin integrity;Pain ? ?   ?  ?PT Treatment Interventions DME instruction;Gait training;Functional mobility training;Therapeutic activities;Therapeutic exercise;Balance training;Neuromuscular re-education;Patient/family education   ? ?PT Goals (Current goals can be found in the Care Plan section)  ?Acute Rehab PT Goals ?Patient Stated Goal: to get strength back from rehab ?PT Goal Formulation: With patient ?Time For Goal Achievement: 04/14/22 ?Potential to Achieve Goals:  Fair ? ?  ?Frequency Min 2X/week ?  ? ? ?Co-evaluation   ?  ?  ?  ?  ? ? ?  ?AM-PAC PT "6 Clicks" Mobility  ?Outcome Measure Help needed turning from your back to your side while in a flat bed without using bedrails?: Total ?Help needed moving from lying on your back to sitting on the side of a flat bed without using bedrails?: Total ?Help needed moving to and from a bed to a chair (including a wheelchair)?: Total ?Help needed standing up from a chair using your arms (e.g., wheelchair or bedside chair)?: Total ?Help needed to walk in hospital room?: Total ?Help needed climbing 3-5 steps with a railing? : Total ?6 Click Score: 6 ? ?  ?End of Session   ?Activity Tolerance: Patient limited by lethargy ?Patient left: in bed;with call bell/phone within reach ?Nurse Communication: Mobility status ?PT Visit Diagnosis: Unsteadiness on feet (R26.81);History of falling (Z91.81);Repeated falls (R29.6);Difficulty in walking, not elsewhere classified (R26.2) ?  ? ?Time: [id number] ?PT Time Calculation (min) (ACUTE  ONLY): 19 min ? ? ?Charges:   PT Evaluation ?$PT Eval Moderate Complexity: 1 Mod ?  ?  ?   ? ?Ramond Dial ?03/31/2022, 1:50 PM ? ?Mee Hives, PT PhD ?Acute Rehab Dept. Number: [facility] [phone] and [place] [phone] ? ?

## 2022-03-31 NOTE — ED Notes (Signed)
Breakfast order placed ?

## 2022-04-01 ENCOUNTER — Inpatient Hospital Stay (HOSPITAL_COMMUNITY): Payer: Medicare HMO

## 2022-04-01 DIAGNOSIS — M6289 Other specified disorders of muscle: Secondary | ICD-10-CM

## 2022-04-01 DIAGNOSIS — G210 Malignant neuroleptic syndrome: Secondary | ICD-10-CM | POA: Diagnosis not present

## 2022-04-01 DIAGNOSIS — E875 Hyperkalemia: Secondary | ICD-10-CM | POA: Diagnosis not present

## 2022-04-01 DIAGNOSIS — R748 Abnormal levels of other serum enzymes: Secondary | ICD-10-CM | POA: Diagnosis not present

## 2022-04-01 DIAGNOSIS — L89152 Pressure ulcer of sacral region, stage 2: Secondary | ICD-10-CM | POA: Diagnosis not present

## 2022-04-01 DIAGNOSIS — E119 Type 2 diabetes mellitus without complications: Secondary | ICD-10-CM

## 2022-04-01 DIAGNOSIS — S4992XA Unspecified injury of left shoulder and upper arm, initial encounter: Secondary | ICD-10-CM | POA: Diagnosis not present

## 2022-04-01 DIAGNOSIS — R627 Adult failure to thrive: Secondary | ICD-10-CM | POA: Diagnosis not present

## 2022-04-01 DIAGNOSIS — E162 Hypoglycemia, unspecified: Secondary | ICD-10-CM | POA: Diagnosis not present

## 2022-04-01 LAB — CBC
HCT: 42.5 % (ref 39.0–52.0)
Hemoglobin: 13.2 g/dL (ref 13.0–17.0)
MCH: 26 pg (ref 26.0–34.0)
MCHC: 31.1 g/dL (ref 30.0–36.0)
MCV: 83.7 fL (ref 80.0–100.0)
Platelets: 239 10*3/uL (ref 150–400)
RBC: 5.08 MIL/uL (ref 4.22–5.81)
RDW: 17.1 % — ABNORMAL HIGH (ref 11.5–15.5)
WBC: 5.7 10*3/uL (ref 4.0–10.5)
nRBC: 0 % (ref 0.0–0.2)

## 2022-04-01 LAB — BASIC METABOLIC PANEL
Anion gap: 7 (ref 5–15)
BUN: 44 mg/dL — ABNORMAL HIGH (ref 8–23)
CO2: 26 mmol/L (ref 22–32)
Calcium: 9.1 mg/dL (ref 8.9–10.3)
Chloride: 100 mmol/L (ref 98–111)
Creatinine, Ser: 1.63 mg/dL — ABNORMAL HIGH (ref 0.61–1.24)
GFR, Estimated: 46 mL/min — ABNORMAL LOW (ref >60)
Glucose, Bld: 196 mg/dL — ABNORMAL HIGH (ref 70–99)
Potassium: 4.7 mmol/L (ref 3.5–5.1)
Sodium: 133 mmol/L — ABNORMAL LOW (ref 135–145)

## 2022-04-01 LAB — GLUCOSE, CAPILLARY
Glucose-Capillary: 200 mg/dL — ABNORMAL HIGH (ref 70–99)
Glucose-Capillary: 267 mg/dL — ABNORMAL HIGH (ref 70–99)
Glucose-Capillary: 283 mg/dL — ABNORMAL HIGH (ref 70–99)
Glucose-Capillary: 216 mg/dL — ABNORMAL HIGH (ref 70–99)

## 2022-04-01 LAB — C-REACTIVE PROTEIN: CRP: 11.4 mg/dL — ABNORMAL HIGH (ref <1.0)

## 2022-04-01 LAB — CORTISOL: Cortisol, Plasma: 15.8 ug/dL

## 2022-04-01 LAB — TSH: TSH: 0.557 u[IU]/mL (ref 0.350–4.500)

## 2022-04-01 LAB — PROCALCITONIN: Procalcitonin: <0.1 ng/mL

## 2022-04-01 LAB — SEDIMENTATION RATE: Sed Rate: 26 mm/hr — ABNORMAL HIGH (ref 0–16)

## 2022-04-01 MED ORDER — BENZTROPINE MESYLATE 1 MG/ML IJ SOLN
1.0000 mg | Freq: Two times a day (BID) | INTRAMUSCULAR | Status: DC
  Administered 2022-04-01 – 2022-04-05 (×8): 1 mg via INTRAVENOUS
  Filled 2022-04-01 (×10): qty 1

## 2022-04-01 MED ORDER — LACTATED RINGERS IV SOLN: INTRAVENOUS | Status: DC

## 2022-04-01 MED ORDER — MORPHINE SULFATE (PF) 2 MG/ML IV SOLN
1.0000 mg | INTRAVENOUS | Status: DC | PRN
  Administered 2022-04-01 (×2): 1 mg via INTRAVENOUS
  Administered 2022-04-03 (×2): 2 mg via INTRAVENOUS
  Filled 2022-04-01 (×4): qty 1

## 2022-04-01 MED ORDER — ENSURE ENLIVE PO LIQD
237.0000 mL | Freq: Two times a day (BID) | ORAL | Status: DC
  Administered 2022-04-01 – 2022-04-12 (×21): 237 mL via ORAL

## 2022-04-01 MED ORDER — ADULT MULTIVITAMIN W/MINERALS CH
1.0000 | ORAL_TABLET | Freq: Every day | ORAL | Status: DC
  Administered 2022-04-01 – 2022-05-20 (×49): 1 via ORAL
  Filled 2022-04-01 (×49): qty 1

## 2022-04-01 NOTE — [place] FL2 (Addendum)
?[place] MEDICAID FL2 LEVEL OF CARE SCREENING TOOL  ?  ? ?IDENTIFICATION  ?Patient Name: ?Christian [name] Birthdate: 1951-03-03 Sex: male Admission Date (Current Location): ?03/30/2022  ?[place] Number: ? [place] ?  Facility and Address:  ?[facility], [address] [place], [place] [address] ?     Provider Number: ?[id number]  ?Attending Physician Name and Address:  ?Elgergawy, Leana Roe, MD ? Relative Name and Phone Number:  ?  ?   ?Current Level of Care: ?Hospital Recommended Level of Care: ?Skilled Nursing Facility Prior Approval Number: ?  ? ?Date Approved/Denied: ?  PASRR Number: ? [id number] E expires 04/14/22 ? ?Discharge Plan: ?SNF ?  ? ?Current Diagnoses: ?Patient Active Problem List  ? Diagnosis Date Noted  ? Hypoglycemia 03/12/2022  ? Cellulitis in diabetic foot (HCC) 01/26/2022  ? Diabetic ulcer of ankle (HCC) 01/26/2022  ? Sepsis (HCC) 04/02/2021  ? Lactic acidosis 04/02/2021  ? Hyponatremia 04/02/2021  ? Hyperkalemia 04/02/2021  ? Chronic kidney disease, stage 3b (HCC) 04/02/2021  ? Fever 04/02/2021  ? Septic shock (HCC) 04/02/2021  ? Altered mental status   ? AKI (acute kidney injury) (HCC)   ? CVA (cerebral vascular accident) (HCC) 10/07/2020  ? Type 2 diabetes mellitus with stage 3 chronic kidney disease (HCC) 04/01/2020  ? CAD (coronary artery disease), native coronary artery 04/01/2020  ? NSTEMI (non-ST elevated myocardial infarction) (HCC) 03/31/2020  ? Obesity, Class III, BMI 40-49.9 (morbid obesity) (HCC) 03/31/2020  ? Aspiration pneumonia of both lower lobes due to gastric secretions (HCC) 03/21/2020  ? Sacral decubitus ulcer, stage II (HCC) 03/19/2020  ? Chronic diastolic CHF (congestive heart failure) (HCC) 03/17/2020  ? Atrial flutter, paroxysmal (HCC) 12/27/2019  ? Constipation 09/16/2018  ? Bipolar 1 disorder, depressed (HCC) 08/22/2018  ? Encephalopathy   ? Paroxysmal atrial fibrillation (HCC) 06/18/2018  ? History of pulmonary embolism 06/17/2018  ?  Generalized weakness 03/27/2018  ? Diabetes mellitus type 2 in nonobese (HCC) 03/27/2018  ? Pulmonary embolism (HCC) 03/27/2018  ? Fall   ? Laceration of eyebrow   ? Bipolar I disorder, most recent episode depressed, severe w psychosis (HCC) 12/22/2015  ? Bipolar affective disorder, depressed, severe, with psychotic behavior (HCC)   ? Bipolar I disorder, current or most recent episode depressed, with psychotic features (HCC)   ? Severe bipolar I disorder with depression (HCC)   ? Bipolar I disorder, most recent episode depressed, severe with psychotic features (HCC)   ? Overdose 09/11/2015  ? Acute respiratory failure with hypoxia (HCC) 09/05/2015  ? Elevated troponin 09/05/2015  ? Somnolence 09/05/2015  ? Bipolar depression (HCC) 09/05/2015  ? Acute bilateral deep vein thrombosis (DVT) of femoral veins (HCC) 09/05/2015  ? Bilateral pulmonary embolism (HCC) 09/02/2015  ? Labile hypertension   ? Pulmonary embolism with acute cor pulmonale (HCC)   ? Dyspnea   ? Orthostatic hypotension   ? Bipolar disorder, in partial remission, most recent episode depressed (HCC)   ? Syncope, near 08/31/2015  ? Bipolar and related disorder (HCC)   ? Bipolar affective disorder, depressed, mild (HCC) 08/28/2015  ? Pressure ulcer 07/30/2015  ? Protein-calorie malnutrition, severe (HCC) 07/30/2015  ? Dehydration 07/29/2015  ? Diabetes (HCC) 07/09/2015  ? UTI (urinary tract infection) 06/19/2014  ? Positive blood culture 06/19/2014  ? Bipolar affective disorder, current episode manic with psychotic symptoms (HCC) 06/17/2014  ? Type 2 diabetes mellitus with complication, without long-term current use of insulin (HCC) 06/02/2014  ? Other and unspecified hyperlipidemia  06/02/2014  ? OSA on CPAP 06/02/2014  ? Bilateral lower extremity edema: chronic with venous stasis changes 06/02/2014  ? Gout 06/02/2014  ? Hypertension   ? ? ?Orientation RESPIRATION BLADDER Height & Weight   ?  ?Self ? O2 (2L nasal cannula) Incontinent, External catheter  Weight:   ?Height:     ?BEHAVIORAL SYMPTOMS/MOOD NEUROLOGICAL BOWEL NUTRITION STATUS  ?    Continent Diet (See dc summary)  ?AMBULATORY STATUS COMMUNICATION OF NEEDS Skin   ?Extensive Assist Verbally PU Stage and Appropriate Care (Stage III on sacrum; venous stasis ulcer;) ?  ?  ?  ?    ?     ?     ? ? ?Personal Care Assistance Level of Assistance  ?Bathing, Feeding, Dressing Bathing Assistance: Maximum assistance ?Feeding assistance: Limited assistance ?Dressing Assistance: Limited assistance ?   ? ?Functional Limitations Info  ?Sight, Speech Sight Info: Impaired ?  ?Speech Info: Impaired  ? ? ?SPECIAL CARE FACTORS FREQUENCY  ?PT (By licensed PT), OT (By licensed OT)   ?  ?PT Frequency: 5x/week ?OT Frequency: 5x/week ?  ?  ?  ?   ? ? ?Contractures Contractures Info: Not present  ? ? ?Additional Factors Info  ?Code Status, Allergies Code Status Info: Full ?Allergies Info: Amlodipine, Onglyza (Saxagliptin), Zyprexa (Olanzapine), Ritalin (Methylphenidate) ?  ?  ?  ?   ? ?Current Medications (04/01/2022):  This is the current hospital active medication list ?Current Facility-Administered Medications  ?Medication Dose Route Frequency Provider Last Rate Last Admin  ? acetaminophen (TYLENOL) tablet 650 mg  650 mg Oral Q6H PRN Eduard Clos, MD   650 mg at 04/01/22 1321  ? Or  ? acetaminophen (TYLENOL) suppository 650 mg  650 mg Rectal Q6H PRN Eduard Clos, MD      ? feeding supplement (ENSURE ENLIVE / ENSURE PLUS) liquid 237 mL  237 mL Oral BID BM Elgergawy, Leana Roe, MD   237 mL at 04/01/22 1457  ? lactated ringers infusion   Intravenous Continuous Elgergawy, Leana Roe, MD 50 mL/hr at 04/01/22 1143 New Bag at 04/01/22 1143  ? liothyronine (CYTOMEL) tablet 10 mcg  10 mcg Oral Daily Eduard Clos, MD   10 mcg at 04/01/22 0847  ? morphine (PF) 2 MG/ML injection 1-2 mg  1-2 mg Intravenous Q4H PRN Elgergawy, Leana Roe, MD   1 mg at 04/01/22 1139  ? multivitamin with minerals tablet 1 tablet  1 tablet Oral  Daily Elgergawy, Leana Roe, MD   1 tablet at 04/01/22 1457  ? rivaroxaban (XARELTO) tablet 20 mg  20 mg Oral Q supper Eduard Clos, MD   20 mg at 03/31/22 1817  ? ? ? ?Discharge Medications: ?Please see discharge summary for a list of discharge medications. ? ?Relevant Imaging Results: ? ?Relevant Lab Results: ? ? ?Additional Information ?SS#[phone] [id number]. Pfizer Covid-19 Vaccine Bivalent Booster 01/28/22 ? ?Renne Crigler Yisroel [place], LCSW ? ? ? ? ?

## 2022-04-01 NOTE — Progress Notes (Signed)
CPAP not at bedside do to pt refusing to wear it. ?

## 2022-04-01 NOTE — Progress Notes (Addendum)
?PROGRESS NOTE ? ? ? ?Massie Silence Purifoy  [id number] DOB: 1951/06/04 DOA: 03/30/2022 ?PCP: Donald Prose, MD  ? ? ?Chief Complaint  ?Patient presents with  ? Weakness  ? ? ?Brief Narrative:  ? ?Jo Sellards Hickey is a 71 y.o. male with history of peripheral arterial disease, bipolar disorder, diastolic CHF, chronic kidney disease, diabetes mellitus, sleep apnea, atrial fibrillation who was recently admitted for acute renal failure with hypoglycemia cellulitis eventually discharged to rehab and was discharged from rehab last week has been unable to ambulate and was feeling generally weak was brought to the ER.  Patient states since his last admission he has not ambulated.  Also has developed sacral decubitus ulcers.  Denies chest pain abdominal pain nausea vomiting patient states he has been eating well.  Patient complains of left shoulder pain due to rotator cuff tear. ?- In the ER patient is found to be hypoglycemic with blood sugar as low as 30s.  Patient lab work also show hyperkalemia with potassium of 6.5.  Creatinine is 1.9. On exam patient does have a sacral decubitus ulcer which does not look infected.  Has poor pulses in the lower extremity but no acute ischemic changes.  Patient was given Lokelma calcium gluconate and sodium bicarbonate.  Patient was started on D10 and D50 for hypoglycemia admitted for further work-up. ?  ? ?Assessment & Plan: ?  ?Principal Problem: ?  Hyperkalemia ?Active Problems: ?  Diabetes mellitus type 2 in nonobese [facility]) ?  Sacral decubitus ulcer, stage II ([place]) ?  Hypoglycemia ? ? ? ?Hypoglycemia ?-rpatient has had hypoglycemia during prior admission and patient states he has been eating well.  ?- Last hemoglobin A1c 2 weeks ago was 5.6. ?-Random cortisol 15.8,  appropriate. ?-Initially requiring D10W, this was discontinued given acceptable CBG currently, will add insulin sliding scale.  . ?-Monitor CBG closely ? ?Generalized weakness, tremors with increased rigidity  ?-Patient  tachypneic, tachycardic, with low-grade temperature, reticulocyte, on Abilify, Wellbutrin and Seroquel, will hold these meds and requested for NMS versus serotonin syndrome. ? ?Hyperkalemia  ?- with history of chronic kidney disease stage III we will get dietitian consult for renal diet.  Patient received sodium bicarbonate Lokelma and calcium gluconate.   ?-Most recent potassium appropriate at 4.5 ? ?AKI in CKD stage III A with mild rhabdomyolysis ?-Is more elevated than baseline, baseline creatinine 1.4 on recent discharge, elevated 1.98 on this admission, continue with IV fluids and avoid nephrotoxic medications. ?-As well total CK mildly elevated at 869, continue with IV fluids ? ?Peripheral vascular disease ?-  will need follow-up with vascular surgery. ? ?A-fib rate controlled on Xarelto. ? ?History of bipolar disorder  ?- on Depakote ,Abilify , Remeron, Wellbutrin and Seroquel. ?-Will hold for now, please see above discussion ? ?History of diastolic dysfunction presently receiving fluids. ? ?History of sleep apnea on CPAP. ? ?Sacral decubitus ulcer stage III does not look infected, wound care consulted ? ?Mildly elevated CK levels will hold statins for now.  Recheck CK levels after hydration. ? ?Hypothyroidism on Cytomel. ?  ? ?DVT prophylaxis: Xarelto ?Code Status: Full ?Family Communication: Ex-wife at bedside ?Disposition:  ? ?Status is: Inpatient ? ?  ?Consultants:  ?Neurology ? ? ?Subjective: ? ?No significant events overnight as discussed with staff, patient still complaining of chronic left shoulder pain related to old rotator cuff tear injury 10 years ago, patient remains with low-grade temperature, tachypneic and tachycardic.   ? ?Objective: ?Vitals:  ? 04/01/22 0759 04/01/22 0832 04/01/22 1201 04/01/22  1305  ?BP:  102/75 104/74 119/79  ?Pulse:  (!) 104 (!) 107 (!) 108  ?Resp: (!) 24 19 (!) 30 (!) 37  ?Temp: 99.1 ?F (37.3 ?C) 98.7 ?F (37.1 ?C) (!) 97.5 ?F (36.4 ?C) 99.2 ?F (37.3 ?C)  ?TempSrc: Oral  Oral Oral Oral  ?SpO2:  94% 100% 100%  ? ?No intake or output data in the 24 hours ending 04/01/22 1315 ?There were no vitals filed for this visit. ? ?Examination: ? ?Awake Alert, he is oriented and more appropriate, conversant today, ill-appearing, some tremors present ?Symmetrical Chest wall movement, Good air movement bilaterally, CTAB ?Tachycardic, no Gallops,Rubs or new Murmurs, No Parasternal Heave ?+ve B.Sounds, Abd Soft, No tenderness, No rebound - guarding or rigidity. ?No Cyanosis, Clubbing or edema, No new Rash or bruise   ? ? ? ? ? ?Data Reviewed: I have personally reviewed following labs and imaging studies ? ?CBC: ?Recent Labs  ?Lab 03/30/22 ?[id number] 03/31/22 ?0205 04/01/22 ?[id number]  ?WBC 6.6 4.6 5.7  ?NEUTROABS 4.5  --   --   ?HGB 14.9 12.8* 13.2  ?HCT 46.7 39.6 42.5  ?MCV 83.1 82.5 83.7  ?PLT 297 227 239  ? ? ? ?Basic Metabolic Panel: ?Recent Labs  ?Lab 03/30/22 ?[id number] 03/31/22 ?0205 04/01/22 ?[id number]  ?NA 134* 134* 133*  ?K 6.5* 4.5 4.7  ?CL 100 101 100  ?CO2 24 27 26   ?GLUCOSE 65* 223* 196*  ?BUN 76* 66* 44*  ?CREATININE 1.98* 1.86* 1.63*  ?CALCIUM 9.5 9.1 9.1  ? ? ? ?GFR: ?Estimated Creatinine Clearance: 56.1 mL/min (A) (by C-G formula based on SCr of 1.63 mg/dL (H)). ? ?Liver Function Tests: ?Recent Labs  ?Lab 03/30/22 ?1833  ?AST 45*  ?ALT 21  ?ALKPHOS 77  ?BILITOT 1.4*  ?PROT 7.4  ?ALBUMIN 2.8*  ? ? ? ?CBG: ?Recent Labs  ?Lab 03/31/22 ?1537 03/31/22 ?1810 03/31/22 ?2339 04/01/22 ?0757 04/01/22 ?1203  ?GLUCAP 177* 107* 231* 200* 267*  ? ? ? ? ?No results found for this or any previous visit (from the past 240 hour(s)).  ? ? ? ? ? ?Radiology Studies: ?DG Pelvis 1-2 Views ? ?Result Date: 03/31/2022 ?CLINICAL DATA:  Bilateral leg weakness EXAM: PELVIS - 1 VIEW COMPARISON:  None Available. FINDINGS: Limited by portable technique with rotation. There is no evidence of pelvic fracture or diastasis. No pelvic bone lesions are seen. Subjective generalized osteopenia. Atheromatous calcification at the bilateral  thigh. IMPRESSION: Negative limited pelvis radiograph. Electronically Signed   By: Jorje Guild M.D.   On: 03/31/2022 05:46  ? ?CT HEAD WO CONTRAST (5MM) ? ?Result Date: 03/31/2022 ?CLINICAL DATA:  Weakness and loss of ambulatory status. Stroke suspected. EXAM: CT HEAD WITHOUT CONTRAST TECHNIQUE: Contiguous axial images were obtained from the base of the skull through the vertex without intravenous contrast. RADIATION DOSE REDUCTION: This exam was performed according to the departmental dose-optimization program which includes automated exposure control, adjustment of the mA and/or kV according to patient size and/or use of iterative reconstruction technique. COMPARISON:  Head CT 03/11/2022 FINDINGS: Brain: There is mild-to-moderate cerebral atrophy, small vessel disease and atrophic ventriculomegaly, without midline shift. There is mild cerebellar atrophy. No new asymmetry is seen worrisome for acute infarct, hemorrhage or mass. Basal cisterns are clear. There are benign dural calcifications in the frontal falx. Vascular: Both distal vertebral arteries and siphons are heavily calcified but no hyperdense central vessel is seen. Skull: The calvarium, skull base and orbits are intact. There are numerous cutaneous and subcutaneous calcifications in the scalp as  well as vascular calcifications. No skull lesion is seen. Sinuses/Orbits: Orbital contents are unchanged in appearance with mild proptosis. Visualized sinuses and mastoid air cells are clear. Other: None. IMPRESSION: Chronic change. No acute intracranial CT findings or interval changes. Electronically Signed   By: Telford Nab M.D.   On: 03/31/2022 06:02   ? ? ? ? ? ?Scheduled Meds: ? ARIPiprazole  20 mg Oral Daily  ? buPROPion  75 mg Oral Daily  ? divalproex  500 mg Oral BID  ? liothyronine  10 mcg Oral Daily  ? mirtazapine  15 mg Oral QHS  ? QUEtiapine  12.5 mg Oral QHS  ? rivaroxaban  20 mg Oral Q supper  ? ?Continuous Infusions: ? lactated ringers 50  mL/hr at 04/01/22 1143  ? ? ? LOS: 1 day  ? ? ? ? ? ?Phillips Climes, MD ?[facility] ? ? ?To contact the attending provider between 7A-7P or the covering provider during after hours 7P-7A, please log into the

## 2022-04-01 NOTE — Plan of Care (Signed)
  Problem: Education: Goal: Knowledge of General Education information will improve Description: Including pain rating scale, medication(s)/side effects and non-pharmacologic comfort measures Outcome: Progressing   Problem: Clinical Measurements: Goal: Ability to maintain clinical measurements within normal limits will improve Outcome: Progressing Goal: Will remain free from infection Outcome: Progressing Goal: Diagnostic test results will improve Outcome: Progressing Goal: Respiratory complications will improve Outcome: Progressing Goal: Cardiovascular complication will be avoided Outcome: Progressing   Problem: Activity: Goal: Risk for activity intolerance will decrease Outcome: Progressing   Problem: Nutrition: Goal: Adequate nutrition will be maintained Outcome: Progressing   Problem: Pain Managment: Goal: General experience of comfort will improve Outcome: Progressing   Problem: Safety: Goal: Ability to remain free from injury will improve Outcome: Progressing   Problem: Skin Integrity: Goal: Risk for impaired skin integrity will decrease Outcome: Progressing   

## 2022-04-01 NOTE — Progress Notes (Signed)
?   04/01/22 1305  ?Assess: MEWS Score  ?Temp 99.2 ?F (37.3 ?C)  ?BP 119/79  ?Pulse Rate (!) 108  ?ECG Heart Rate (!) 108  ?Resp (!) 37  ?SpO2 100 %  ?Assess: MEWS Score  ?MEWS Temp 0  ?MEWS Systolic 0  ?MEWS Pulse 1  ?MEWS RR 3  ?MEWS LOC 0  ?MEWS Score 4  ?MEWS Score Color Red  ?Assess: if the MEWS score is Yellow or Red  ?Were vital signs taken at a resting state? Yes  ?Focused Assessment No change from prior assessment  ?Early Detection of Sepsis Score *See Row Information* Medium  ?MEWS guidelines implemented *See Row Information* Yes  ?Treat  ?MEWS Interventions Administered prn meds/treatments  ?Take Vital Signs  ?Increase Vital Sign Frequency  Red: Q 1hr X 4 then Q 4hr X 4, if remains red, continue Q 4hrs  ?Escalate  ?MEWS: Escalate Red: discuss with charge nurse/RN and provider, consider discussing with RRT  ?Notify: Charge Nurse/RN  ?Name of Charge Nurse/RN Notified Delcine J.  ?Date Charge Nurse/RN Notified 04/01/22  ?Time Charge Nurse/RN Notified 1308  ?Notify: Provider  ?Provider Name/Title Dr. Randol Kern  ?Date Provider Notified 04/01/22  ?Time Provider Notified 1308  ?Notification Type Call  ?Notification Reason Change in status  ?Provider response En route  ?Date of Provider Response 04/01/22  ?Time of Provider Response 1320  ? ? ?

## 2022-04-01 NOTE — Progress Notes (Signed)
Initial Nutrition Assessment ? ?DOCUMENTATION CODES:  ? ?Not applicable ? ?INTERVENTION:  ? ?Multivitamin w/ minerals daily ?Ensure Enlive po BID, each supplement provides 350 kcal and 20 grams of protein. ? ?NUTRITION DIAGNOSIS:  ? ?Increased nutrient needs related to chronic illness, wound healing as evidenced by estimated needs. ? ?GOAL:  ? ?Patient will meet greater than or equal to 90% of their needs ? ?MONITOR:  ? ?PO intake, Supplement acceptance, Labs, Weight trends, Skin ? ?REASON FOR ASSESSMENT:  ? ?Consult ?Assessment of nutrition requirement/status ? ?ASSESSMENT:  ? ?71 y.o. male presented to the ED with generalized weakness. Pt recently discharged from rehab after hospital admission due to hypoglycemia cellulitis. PMH includes CHF, CKD III, HTN, and T2DM. Pt admitted with hypoglycemia, hyperkalemia, and generalized weakness.  ? ?Pt reports that his appetite was good PTA and currently. States that he ate a really big breakfast this morning and was still full. Pt reports that at home his sister would bring him food and he was part of a program that delivered one meal per day to his home from [facility]. Pt states that he was eating well while at rehab too and his appetite was good there. Denies any nausea or vomiting. ? ?Pt reports that he was in the 270# range and recently was in the 260# range. States that he was not getting enough therapy at rehab and feels week. Reports that he was using a Rolator to ambulate at home, now is not able to get around as much.  No weight this admission to assess pt weight loss.  ? ?Pt reports that he drinks 1-2 Ensure at home per day. Discussed that RD can order them here and pt can continue his home regimen. Pt agreeable.  ? ?Medications reviewed. ?Labs reviewed: Sodium 133, BUN 44, Creatinine 1.63 ? ?NUTRITION - FOCUSED PHYSICAL EXAM: ? ?Flowsheet Row Most Recent Value  ?Orbital Region Mild depletion  ?Upper Arm Region No depletion  ?Thoracic and Lumbar Region No  depletion  ?Buccal Region Mild depletion  ?Temple Region No depletion  ?Clavicle Bone Region No depletion  ?Clavicle and Acromion Bone Region No depletion  ?Scapular Bone Region No depletion  ?Dorsal Hand No depletion  ?Patellar Region Unable to assess  ?Anterior Thigh Region Unable to assess  ?Posterior Calf Region Unable to assess  ?Edema (RD Assessment) Mild  ?Hair Reviewed  ?Eyes Reviewed  ?Mouth Reviewed  ?Skin Reviewed  ?Nails Reviewed  ? ?Diet Order:   ?Diet Order   ? ?       ?  Diet heart healthy/carb modified Room service appropriate? No; Fluid consistency: Thin  Diet effective now       ?  ? ?  ?  ? ?  ? ? ?EDUCATION NEEDS:  ? ?No education needs have been identified at this time ? ?Skin:  Skin Assessment: Skin Integrity Issues: ?Skin Integrity Issues:: Stage III ?Stage III: Sacrum ? ?Last BM:  No Documentation ? ?Height:  ? ?Ht Readings from Last 1 Encounters:  ?03/12/22 6' (1.829 m)  ? ? ?Weight:  ? ?Wt Readings from Last 1 Encounters:  ?03/19/22 112 kg  ? ? ?Ideal Body Weight:  80.9 kg ? ?BMI:  There is no height or weight on file to calculate BMI. ? ?Estimated Nutritional Needs:  ? ?Kcal:  2100-2300 ? ?Protein:  105-120 grams ? ?Fluid:  >/= 2.1 L ? ? ?Hermina Barters RD, LDN ?Clinical Dietitian ?See AMiON for contact information.  ?

## 2022-04-01 NOTE — Consult Note (Signed)
NEUROLOGY CONSULTATION NOTE  ? ?Date of service: Apr 01, 2022 ?Patient Name: Christian [name] ?MRN:  [id number] ?DOB:  1951/09/27 ?Reason for consult: "Tremors" ?_ _ _   _ __   _ __ _ _  __ __   _ __   __ _ ? ?History of Present Illness  ?Christian [name] is a 71 y.o. male with PMH significant for  has a past medical history of Bilateral lower extremity edema: chronic with venous stasis changes (06/02/2014), Bipolar 1 disorder (HCC), Bipolar disorder (HCC), CHF (congestive heart failure) (HCC), Chronic kidney disease, Gout, Gout, blood clots, Hypertension, Mood swings, OSA on CPAP (06/02/2014), Other and unspecified hyperlipidemia (06/02/2014), and Type II or unspecified type diabetes mellitus with unspecified complication, uncontrolled (06/02/2014). who presents to [facility] on 05/02 with weakness.  ? ?Patient presented to the ED from the pharmacy where he was found to have weakness and hypoglycemia to the 30's. Patient has had multiple hospitalizations recently for hypoglycemia and generalized weakness and was recently discharged on 04/19.  Patient states that over the past few weeks he has noticed progression of his generalized weakness. Particularly in the left upper extremity with associated left shoulder pain. Patient has developed muscle rigidity and tremors in his upper extremities over this time period. He states that he lives alone and is able to ambulates with a walker at baseline without tremors. He states that he has previously been diagnosed with rotator cuff problems and believe that it could be the cause his left shoulder pain. He denies any acute change in mentation, headache, visional changes, change in sensation. Patient does have a sacral ulcer but denies any signs of systemic inflammation or infection (fever or chills).  ? ?Patient has a history of bipolar disorder on Aripiprazole, Seroquel and Depakote and has multiple serotonergic medications on his med rec. His Seroquel was recently decreased but otherwise no  recent changes in medications. He denies any illicit drug use or ETOH use.  ? ? ?  ?ROS  ? ?Constitutional Denies weight loss, fever and chills.   ?HEENT Denies changes in vision and hearing.   ?Respiratory Denies SOB and cough.   ?CV Denies palpitations and CP   ?GI Denies abdominal pain, nausea, vomiting and diarrhea.   ?GU Denies dysuria and urinary frequency.   ?MSK Denies myalgia and joint pain.   ?Skin Denies rash and pruritus.   ?Neurological Denies headache and syncope.   ?Psychiatric Denies recent changes in mood. Denies anxiety and depression.   ? ?Past History  ? ?Past Medical History:  ?Diagnosis Date  ? Bilateral lower extremity edema: chronic with venous stasis changes 06/02/2014  ? Bipolar 1 disorder (HCC)   ? Bipolar disorder (HCC)   ? CHF (congestive heart failure) (HCC)   ? Chronic kidney disease   ? Gout   ? Gout   ? Hx of blood clots   ? Hypertension   ? Mood swings   ? OSA on CPAP 06/02/2014  ? Other and unspecified hyperlipidemia 06/02/2014  ? Type II or unspecified type diabetes mellitus with unspecified complication, uncontrolled 06/02/2014  ? ?Past Surgical History:  ?Procedure Laterality Date  ? ABDOMINAL AORTOGRAM W/LOWER EXTREMITY N/A 09/16/2021  ? Procedure: ABDOMINAL AORTOGRAM W/LOWER EXTREMITY;  Surgeon: Victorino Sparrow, MD;  Location: [facility] INVASIVE CV LAB;  Service: Cardiovascular;  Laterality: N/A;  ? LEFT HEART CATH AND CORONARY ANGIOGRAPHY Left 04/01/2020  ? Procedure: LEFT HEART CATH AND CORONARY ANGIOGRAPHY;  Surgeon: Laurier Nancy, MD;  Location: [facility]  INVASIVE CV LAB;  Service: Cardiovascular;  Laterality: Left;  ? PERIPHERAL VASCULAR BALLOON ANGIOPLASTY Left 09/16/2021  ? Procedure: PERIPHERAL VASCULAR BALLOON ANGIOPLASTY;  Surgeon: Victorino Sparrow, MD;  Location: [facility] INVASIVE CV LAB;  Service: Cardiovascular;  Laterality: Left;  attempted to PTA posterior tibial artery; unable to cross lesion  ? ?Family History  ?Problem Relation Age of Onset  ? Dementia Mother   ? Arthritis Father   ?  Hypertension Father   ? Mental illness Sister   ? Diabetes Sister   ? Heart failure Neg Hx   ? Kidney failure Neg Hx   ? Cancer Neg Hx   ? ?Social History  ? ?Socioeconomic History  ? Marital status: Divorced  ?  Spouse name: Not on file  ? Number of children: Not on file  ? Years of education: Not on file  ? Highest education level: Not on file  ?Occupational History  ? Not on file  ?Tobacco Use  ? Smoking status: Never  ? Smokeless tobacco: Never  ?Vaping Use  ? Vaping Use: Never used  ?Substance and Sexual Activity  ? Alcohol use: No  ? Drug use: No  ? Sexual activity: Not Currently  ?Other Topics Concern  ? Not on file  ?Social History Narrative  ? *The patient is divorced and is currently on disability. He lives alone and has one daughter.  ?   ?   ?    ? ?Social Determinants of Health  ? ?Financial Resource Strain: Not on file  ?Food Insecurity: Not on file  ?Transportation Needs: Not on file  ?Physical Activity: Not on file  ?Stress: Not on file  ?Social Connections: Not on file  ? ?Allergies  ?Allergen Reactions  ? Amlodipine Swelling and Other (See Comments)  ?  Legs became swollen  ? Onglyza [Saxagliptin] Other (See Comments)  ?  "Allergic," per [facility]  ? Zyprexa [Olanzapine] Other (See Comments)  ?  Allergy noted by ACT Team. No reaction specified (??)- "Allergic," per [facility]  ? Ritalin [Methylphenidate] Anxiety and Other (See Comments)  ?  "[place]"  ? ? ?Medications  ? ?Medications Prior to Admission  ?Medication Sig Dispense Refill Last Dose  ? acetaminophen (TYLENOL) 500 MG tablet Take 1,000 mg by mouth daily as needed (pain.).   unk  ? ARIPiprazole (ABILIFY) 20 MG tablet Take 1 tablet (20 mg total) by mouth daily. 30 tablet 0 03/29/2022  ? atorvastatin (LIPITOR) 10 MG tablet Take 1 tablet (10 mg total) by mouth daily at 6 PM. (Patient taking differently: Take 10 mg by mouth every evening.) 30 tablet 0 03/29/2022  ? buPROPion (WELLBUTRIN) 75 MG tablet Take 1 tablet (75 mg total)  by mouth daily. 30 tablet 0 03/30/2022  ? divalproex (DEPAKOTE ER) 500 MG 24 hr tablet Take 500 mg by mouth in the morning and at bedtime.   03/30/2022  ? empagliflozin (JARDIANCE) 10 MG TABS tablet Take 10 mg by mouth daily.    03/30/2022  ? glimepiride (AMARYL) 2 MG tablet Take 2 mg by mouth in the morning.   unk  ? insulin glargine (LANTUS) 100 UNIT/ML injection Inject 0.05 mLs (5 Units total) into the skin daily. 10 mL 3 03/29/2022  ? liothyronine (CYTOMEL) 5 MCG tablet Take 10 mcg by mouth daily.   03/30/2022  ? lisinopril (ZESTRIL) 20 MG tablet Take 20 mg by mouth daily.   unk  ? metoprolol succinate (TOPROL-XL) 50 MG 24 hr tablet Take 1 tablet (50  mg total) by mouth daily. Take with or immediately following a meal. (Patient taking differently: Take 50 mg by mouth daily.) 30 tablet 0 unk at unk  ? mirtazapine (REMERON) 15 MG tablet Take 15 mg by mouth at bedtime.   03/29/2022  ? polyethylene glycol powder (GLYCOLAX/MIRALAX) 17 GM/SCOOP powder Take 17 g by mouth daily. (Patient taking differently: Take 17 g by mouth daily as needed for mild constipation.) 255 g 0 03/22/2022  ? QUEtiapine (SEROQUEL) 25 MG tablet Take 0.5 tablets (12.5 mg total) by mouth at bedtime. (Patient taking differently: Take 25 mg by mouth at bedtime.)   03/29/2022  ? rivaroxaban (XARELTO) 20 MG TABS tablet Take 1 tablet (20 mg total) by mouth daily with supper. (Patient taking differently: Take 20 mg by mouth daily.) 30 tablet 0 03/30/2022 at 1100  ? torsemide (DEMADEX) 20 MG tablet Take 1 tablet (20 mg total) by mouth daily.   03/30/2022  ? Vitamin D, Ergocalciferol, (DRISDOL) 1.25 MG (50000 UNIT) CAPS capsule Take 50,000 Units by mouth every 7 (seven) days.   03/24/2022  ? cephALEXin (KEFLEX) 500 MG capsule Take 500 mg by mouth every 8 (eight) hours. For five days (Patient not taking: Reported on 03/31/2022)   Completed Course  ? QUEtiapine (SEROQUEL) 50 MG tablet Take 50 mg by mouth at bedtime. (Patient not taking: Reported on 03/31/2022)   Not Taking  ?   ? ?Vitals  ? ?Vitals:  ? 04/01/22 0759 04/01/22 0832 04/01/22 1201 04/01/22 1305  ?BP:  102/75 104/74 119/79  ?Pulse:  (!) 104 (!) 107 (!) 108  ?Resp: (!) 24 19 (!) 30 (!) 37  ?Temp: 99.1 ?F (37.3 ?C) 9

## 2022-04-01 NOTE — TOC Initial Note (Signed)
Transition of Care (TOC) - Initial/Assessment Note  ? ? ?Patient Details  ?Name: Christian [name] ?MRN: [id number] ?Date of Birth: 1951/08/25 ? ?Transition of Care (TOC) CM/SW Contact:    ?Benard Halsted, LCSW ?Phone Number: ?04/01/2022, 1:35 PM ? ?Clinical Narrative:                 ?CSW received call from patient's daughter, Sharl Ma regarding transitioning patient back to rehab. CSW answered questions, letting her know that since patient has used his 20 Medicare days that have been covered, he will be in copay days ($200/day) and that she would have to contact DSS to convert his QMB Medicaid to Facility Medicaid and sign his monthly check over to SNF for a minimum of 30 days. She stated the difficulty is that they are hoping patient will be able to transition home after 3 weeks and also have to pay for patient's house payments. She stated she will call CSW back when she is with her mother so CSW can explain to her as well.  ? ?Expected Discharge Plan: [place] ?Barriers to Discharge: Continued Medical Work up, Ship broker, SNF Pending bed offer ? ? ?Patient Goals and CMS Choice ?Patient states their goals for this hospitalization and ongoing recovery are:: Rehab ?CMS Medicare.gov Compare Post Acute Care list provided to:: Patient Represenative (must comment) ?Choice offered to / list presented to : Adult Children ? ?Expected Discharge Plan and Services ?Expected Discharge Plan: [place] ?In-house Referral: Clinical Social Work ?  ?Post Acute Care Choice: [place] ?Living arrangements for the past 2 months: [place], [place] ?                ?  ?  ?  ?  ?  ?  ?  ?  ?  ?  ? ?Prior Living Arrangements/Services ?Living arrangements for the past 2 months: [place], [place] ?Lives with:: Self ?Patient language and need for interpreter reviewed:: Yes ?Do you feel safe going back to the place where you live?: Yes       ?Need for Family Participation in Patient Care: Yes (Comment) ?Care giver support system in place?: Yes (comment) ?Current home services: DME, Home PT, Home OT, Home RN ([facility]ator-[facility] active HHRN/PT/OT) ?Criminal Activity/Legal Involvement Pertinent to Current Situation/Hospitalization: No - Comment as needed ? ?Activities of Daily Living ?  ?  ? ?Permission Sought/Granted ?Permission sought to share information with : Facility Sport and exercise psychologist, Family Supports ?Permission granted to share information with : No ? Share Information with NAME: Sharl Ma ? Permission granted to share info w AGENCY: SNFs ? Permission granted to share info w Relationship: Daughter ? Permission granted to share info w Contact Information: [phone] ? ?Emotional Assessment ?Appearance:: Appears stated age ?Attitude/Demeanor/Rapport: Unable to Assess ?Affect (typically observed): Unable to Assess ?Orientation: : Oriented to Self ?Alcohol / Substance Use: Not Applicable ?Psych Involvement: No (comment) ? ?Admission diagnosis:  Hyperkalemia [E87.5] ?Hypoglycemia [E16.2] ?Elevated CK [R74.8] ?PAD (peripheral artery disease) ([place]) [I73.9] ?Failure to thrive in adult [R62.7] ?Patient Active Problem List  ? Diagnosis Date Noted  ? Hypoglycemia 03/12/2022  ? Cellulitis in diabetic foot ([place]) 01/26/2022  ? Diabetic ulcer of ankle ([place]) 01/26/2022  ? Sepsis ([place]) 04/02/2021  ? Lactic acidosis 04/02/2021  ? Hyponatremia 04/02/2021  ? Hyperkalemia 04/02/2021  ? Chronic kidney disease, stage 3b ([place]) 04/02/2021  ? Fever 04/02/2021  ? Septic shock ([place]) 04/02/2021  ? Altered mental status   ?  AKI (acute kidney injury) ([place])   ? CVA (cerebral vascular accident) ([place]) 10/07/2020  ? Type 2 diabetes mellitus with stage 3 chronic kidney disease ([place]) 04/01/2020  ? CAD (coronary artery disease), native coronary artery 04/01/2020  ? NSTEMI (non-ST elevated myocardial infarction) ([place]) 03/31/2020  ? Obesity, Class III, BMI 40-49.9  (morbid obesity) ([place]) 03/31/2020  ? Aspiration pneumonia of both lower lobes due to gastric secretions ([place]) 03/21/2020  ? Sacral decubitus ulcer, stage II ([place]) 03/19/2020  ? Chronic diastolic CHF (congestive heart failure) ([place]) 03/17/2020  ? Atrial flutter, paroxysmal ([place]) 12/27/2019  ? Constipation 09/16/2018  ? Bipolar 1 disorder, depressed ([place]) 08/22/2018  ? Encephalopathy   ? Paroxysmal atrial fibrillation ([place]) 06/18/2018  ? History of pulmonary embolism 06/17/2018  ? Generalized weakness 03/27/2018  ? Diabetes mellitus type 2 in nonobese ([place]) 03/27/2018  ? Pulmonary embolism ([place]) 03/27/2018  ? Fall   ? Laceration of eyebrow   ? Bipolar I disorder, most recent episode depressed, severe w psychosis ([place]) 12/22/2015  ? Bipolar affective disorder, depressed, severe, with psychotic behavior ([place])   ? Bipolar I disorder, current or most recent episode depressed, with psychotic features ([place])   ? Severe bipolar I disorder with depression ([place])   ? Bipolar I disorder, most recent episode depressed, severe with psychotic features ([place])   ? Overdose 09/11/2015  ? Acute respiratory failure with hypoxia ([place]) 09/05/2015  ? Elevated troponin 09/05/2015  ? Somnolence 09/05/2015  ? Bipolar depression ([place]) 09/05/2015  ? Acute bilateral deep vein thrombosis (DVT) of femoral veins ([place]) 09/05/2015  ? Bilateral pulmonary embolism ([place]) 09/02/2015  ? Labile hypertension   ? Pulmonary embolism with acute cor pulmonale ([place])   ? Dyspnea   ? Orthostatic hypotension   ? Bipolar disorder, in partial remission, most recent episode depressed ([place])   ? Syncope, near 08/31/2015  ? Bipolar and related disorder ([place])   ? Bipolar affective disorder, depressed, mild ([place]) 08/28/2015  ? Pressure ulcer 07/30/2015  ? Protein-calorie malnutrition, severe ([place]) 07/30/2015  ? Dehydration 07/29/2015  ? Diabetes ([place]) 07/09/2015  ? UTI (urinary tract infection) 06/19/2014  ? Positive blood culture 06/19/2014  ? Bipolar affective disorder,  current episode manic with psychotic symptoms ([place]) 06/17/2014  ? Type 2 diabetes mellitus with complication, without long-term current use of insulin ([place]) 06/02/2014  ? Other and unspecified hyperlipidemia 06/02/2014  ? OSA on CPAP 06/02/2014  ? Bilateral lower extremity edema: chronic with venous stasis changes 06/02/2014  ? Gout 06/02/2014  ? Hypertension   ? ?PCP:  Donald Prose, MD ?Pharmacy:   ?[facility] ?[address] ?[place] [address] ?Phone: [phone] Fax: [phone] ? ? ? ? ?Social Determinants of Health (SDOH) Interventions ?  ? ?Readmission Risk Interventions ? ?  01/28/2022  ? 10:17 AM  ?Readmission Risk Prevention Plan  ?Transportation Screening Complete  ?PCP or Specialist Appt within 5-7 Days Complete  ?Home Care Screening Complete  ?Medication Review (RN CM) Complete  ? ? ? ?

## 2022-04-02 DIAGNOSIS — G210 Malignant neuroleptic syndrome: Secondary | ICD-10-CM | POA: Diagnosis not present

## 2022-04-02 DIAGNOSIS — E875 Hyperkalemia: Secondary | ICD-10-CM | POA: Diagnosis not present

## 2022-04-02 DIAGNOSIS — F3189 Other bipolar disorder: Secondary | ICD-10-CM | POA: Diagnosis not present

## 2022-04-02 DIAGNOSIS — R627 Adult failure to thrive: Secondary | ICD-10-CM | POA: Diagnosis not present

## 2022-04-02 DIAGNOSIS — L89152 Pressure ulcer of sacral region, stage 2: Secondary | ICD-10-CM | POA: Diagnosis not present

## 2022-04-02 DIAGNOSIS — R748 Abnormal levels of other serum enzymes: Secondary | ICD-10-CM | POA: Diagnosis not present

## 2022-04-02 DIAGNOSIS — E119 Type 2 diabetes mellitus without complications: Secondary | ICD-10-CM | POA: Diagnosis not present

## 2022-04-02 DIAGNOSIS — E162 Hypoglycemia, unspecified: Secondary | ICD-10-CM | POA: Diagnosis not present

## 2022-04-02 DIAGNOSIS — M6289 Other specified disorders of muscle: Secondary | ICD-10-CM | POA: Diagnosis not present

## 2022-04-02 LAB — GLUCOSE, CAPILLARY
Glucose-Capillary: 127 mg/dL — ABNORMAL HIGH (ref 70–99)
Glucose-Capillary: 199 mg/dL — ABNORMAL HIGH (ref 70–99)
Glucose-Capillary: 171 mg/dL — ABNORMAL HIGH (ref 70–99)
Glucose-Capillary: 155 mg/dL — ABNORMAL HIGH (ref 70–99)

## 2022-04-02 LAB — RETICULOCYTES
Immature Retic Fract: 2.7 % (ref 2.3–15.9)
RBC.: 5.11 MIL/uL (ref 4.22–5.81)
Retic Count, Absolute: 40.9 10*3/uL (ref 19.0–186.0)
Retic Ct Pct: 0.8 % (ref 0.4–3.1)

## 2022-04-02 LAB — CBC
HCT: 41.9 % (ref 39.0–52.0)
Hemoglobin: 13.2 g/dL (ref 13.0–17.0)
MCH: 26.1 pg (ref 26.0–34.0)
MCHC: 31.5 g/dL (ref 30.0–36.0)
MCV: 83 fL (ref 80.0–100.0)
Platelets: 235 10*3/uL (ref 150–400)
RBC: 5.05 MIL/uL (ref 4.22–5.81)
RDW: 17.2 % — ABNORMAL HIGH (ref 11.5–15.5)
WBC: 6.2 10*3/uL (ref 4.0–10.5)
nRBC: 0 % (ref 0.0–0.2)

## 2022-04-02 LAB — FOLATE: Folate: 8.7 ng/mL (ref >5.9)

## 2022-04-02 LAB — BASIC METABOLIC PANEL
Anion gap: 7 (ref 5–15)
BUN: 34 mg/dL — ABNORMAL HIGH (ref 8–23)
CO2: 25 mmol/L (ref 22–32)
Calcium: 9.1 mg/dL (ref 8.9–10.3)
Chloride: 101 mmol/L (ref 98–111)
Creatinine, Ser: 1.66 mg/dL — ABNORMAL HIGH (ref 0.61–1.24)
GFR, Estimated: 45 mL/min — ABNORMAL LOW (ref >60)
Glucose, Bld: 135 mg/dL — ABNORMAL HIGH (ref 70–99)
Potassium: 5 mmol/L (ref 3.5–5.1)
Sodium: 133 mmol/L — ABNORMAL LOW (ref 135–145)

## 2022-04-02 LAB — URIC ACID: Uric Acid, Serum: 9.1 mg/dL — ABNORMAL HIGH (ref 3.7–8.6)

## 2022-04-02 LAB — VITAMIN B12: Vitamin B-12: 496 pg/mL (ref 180–914)

## 2022-04-02 LAB — IRON AND TIBC
Iron: 76 ug/dL (ref 45–182)
Saturation Ratios: 35 % (ref 17.9–39.5)
TIBC: 218 ug/dL — ABNORMAL LOW (ref 250–450)
UIBC: 142 ug/dL

## 2022-04-02 LAB — FERRITIN: Ferritin: 125 ng/mL (ref 24–336)

## 2022-04-02 MED ORDER — BISACODYL 5 MG PO TBEC
10.0000 mg | DELAYED_RELEASE_TABLET | Freq: Once | ORAL | Status: DC
  Filled 2022-04-02: qty 2

## 2022-04-02 MED ORDER — ARIPIPRAZOLE 5 MG PO TABS
10.0000 mg | ORAL_TABLET | Freq: Every morning | ORAL | Status: DC
  Administered 2022-04-03 – 2022-05-16 (×44): 10 mg via ORAL
  Filled 2022-04-02: qty 1
  Filled 2022-04-02: qty 2
  Filled 2022-04-02 (×9): qty 1
  Filled 2022-04-02: qty 2
  Filled 2022-04-02 (×13): qty 1
  Filled 2022-04-02: qty 2
  Filled 2022-04-02 (×7): qty 1
  Filled 2022-04-02 (×3): qty 2
  Filled 2022-04-02 (×2): qty 1
  Filled 2022-04-02: qty 2
  Filled 2022-04-02: qty 1
  Filled 2022-04-02: qty 2
  Filled 2022-04-02 (×2): qty 1
  Filled 2022-04-02: qty 2
  Filled 2022-04-02 (×2): qty 1

## 2022-04-02 MED ORDER — INSULIN ASPART 100 UNIT/ML IJ SOLN
0.0000 [IU] | Freq: Three times a day (TID) | INTRAMUSCULAR | Status: DC
  Administered 2022-04-02: 2 [IU] via SUBCUTANEOUS
  Administered 2022-04-03: 3 [IU] via SUBCUTANEOUS
  Administered 2022-04-03 – 2022-04-04 (×2): 2 [IU] via SUBCUTANEOUS
  Administered 2022-04-04: 1 [IU] via SUBCUTANEOUS
  Administered 2022-04-04: 5 [IU] via SUBCUTANEOUS
  Administered 2022-04-05 – 2022-04-06 (×4): 3 [IU] via SUBCUTANEOUS
  Administered 2022-04-06: 5 [IU] via SUBCUTANEOUS
  Administered 2022-04-06 – 2022-04-07 (×2): 2 [IU] via SUBCUTANEOUS
  Administered 2022-04-07 (×2): 5 [IU] via SUBCUTANEOUS
  Administered 2022-04-08: 7 [IU] via SUBCUTANEOUS
  Administered 2022-04-08: 5 [IU] via SUBCUTANEOUS
  Administered 2022-04-08: 2 [IU] via SUBCUTANEOUS
  Administered 2022-04-09: 9 [IU] via SUBCUTANEOUS
  Administered 2022-04-09: 3 [IU] via SUBCUTANEOUS
  Administered 2022-04-09 – 2022-04-10 (×2): 5 [IU] via SUBCUTANEOUS
  Administered 2022-04-10 – 2022-04-11 (×3): 2 [IU] via SUBCUTANEOUS
  Administered 2022-04-11 (×2): 3 [IU] via SUBCUTANEOUS
  Administered 2022-04-12: 2 [IU] via SUBCUTANEOUS
  Administered 2022-04-12 – 2022-04-13 (×3): 1 [IU] via SUBCUTANEOUS
  Administered 2022-04-14: 5 [IU] via SUBCUTANEOUS
  Administered 2022-04-14 (×2): 2 [IU] via SUBCUTANEOUS
  Administered 2022-04-15: 3 [IU] via SUBCUTANEOUS
  Administered 2022-04-15: 1 [IU] via SUBCUTANEOUS
  Administered 2022-04-15: 5 [IU] via SUBCUTANEOUS
  Administered 2022-04-16 – 2022-04-17 (×2): 2 [IU] via SUBCUTANEOUS
  Administered 2022-04-17: 1 [IU] via SUBCUTANEOUS
  Administered 2022-04-17 – 2022-04-19 (×2): 2 [IU] via SUBCUTANEOUS
  Administered 2022-04-20: 3 [IU] via SUBCUTANEOUS
  Administered 2022-04-21 – 2022-04-26 (×6): 2 [IU] via SUBCUTANEOUS
  Administered 2022-04-26: 1 [IU] via SUBCUTANEOUS
  Administered 2022-04-27: 2 [IU] via SUBCUTANEOUS
  Administered 2022-04-28: 1 [IU] via SUBCUTANEOUS
  Administered 2022-04-28: 3 [IU] via SUBCUTANEOUS
  Administered 2022-04-29: 5 [IU] via SUBCUTANEOUS
  Administered 2022-04-29: 2 [IU] via SUBCUTANEOUS
  Administered 2022-04-30 (×2): 3 [IU] via SUBCUTANEOUS
  Administered 2022-05-01 (×2): 2 [IU] via SUBCUTANEOUS
  Administered 2022-05-01: 3 [IU] via SUBCUTANEOUS
  Administered 2022-05-02: 2 [IU] via SUBCUTANEOUS
  Administered 2022-05-02: 5 [IU] via SUBCUTANEOUS
  Administered 2022-05-02: 3 [IU] via SUBCUTANEOUS
  Administered 2022-05-03: 5 [IU] via SUBCUTANEOUS
  Administered 2022-05-03: 1 [IU] via SUBCUTANEOUS
  Administered 2022-05-03: 2 [IU] via SUBCUTANEOUS
  Administered 2022-05-04: 5 [IU] via SUBCUTANEOUS
  Administered 2022-05-04: 3 [IU] via SUBCUTANEOUS
  Administered 2022-05-04: 7 [IU] via SUBCUTANEOUS
  Administered 2022-05-05: 9 [IU] via SUBCUTANEOUS
  Administered 2022-05-05: 7 [IU] via SUBCUTANEOUS

## 2022-04-02 MED ORDER — SENNOSIDES-DOCUSATE SODIUM 8.6-50 MG PO TABS
2.0000 | ORAL_TABLET | Freq: Two times a day (BID) | ORAL | Status: DC
  Administered 2022-04-02 – 2022-05-20 (×64): 2 via ORAL
  Filled 2022-04-02 (×78): qty 2

## 2022-04-02 MED ORDER — POLYETHYLENE GLYCOL 3350 17 G PO PACK
34.0000 g | PACK | Freq: Every day | ORAL | Status: DC
  Administered 2022-04-02 – 2022-04-03 (×2): 34 g via ORAL
  Filled 2022-04-02 (×2): qty 2

## 2022-04-02 NOTE — Care Management Important Message (Signed)
Important Message ? ?Patient Details  ?Name: Christian [name] ?MRN: [id number] ?Date of Birth: 09-05-1951 ? ? ?Medicare Important Message Given:  Yes ? ? ? ? ?[name] ?04/02/2022, 4:32 PM ?

## 2022-04-02 NOTE — Plan of Care (Signed)
  Problem: Education: Goal: Knowledge of General Education information will improve Description: Including pain rating scale, medication(s)/side effects and non-pharmacologic comfort measures Outcome: Progressing   Problem: Clinical Measurements: Goal: Ability to maintain clinical measurements within normal limits will improve Outcome: Progressing Goal: Will remain free from infection Outcome: Progressing Goal: Diagnostic test results will improve Outcome: Progressing Goal: Respiratory complications will improve Outcome: Progressing Goal: Cardiovascular complication will be avoided Outcome: Progressing   Problem: Activity: Goal: Risk for activity intolerance will decrease Outcome: Progressing   Problem: Nutrition: Goal: Adequate nutrition will be maintained Outcome: Progressing   Problem: Pain Managment: Goal: General experience of comfort will improve Outcome: Progressing   Problem: Safety: Goal: Ability to remain free from injury will improve Outcome: Progressing   Problem: Skin Integrity: Goal: Risk for impaired skin integrity will decrease Outcome: Progressing   

## 2022-04-02 NOTE — Consult Note (Signed)
[facility] New Face-to-Face Psychiatric Evaluation ? ? ?Service Date: Apr 02, 2022 ?LOS:  LOS: 2 days  ? ? ?Assessment  ?Christian [name] is a 71 y.o. male admitted medically for 03/30/2022  5:04 PM for worsening weakness . He carries the psychiatric diagnoses of bipolar I disorder and has a past medical history of CKD, bilateral l/e edema, gout, hx blood clots, HTN, OSA on CPAP, HLD, DMII. Psychiatry was consulted for reinitiation of psychotropic medications by Dr. Waldron Labs at the request of Dr. Leonel Ramsay in neurology.  ? ?This is a complex pt with a long history of mental illness; has been on psychotropic medications in some form for 50+ years. 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 From collateral with family and ACT team, has mentally been doing quite well on current regimen for last 6+ months with multiple previous failed trials.  ? ?With above history, it is not tenable to leave pt off of mood stabilizing medications even with concern for NMS. His current presentation is not consistent with NMS (no HTN, focal rather than diffuse dystonia/stiffness, only low grade fevers). Catatonia was considered but pt does not screen + on brief BFRS (only + item was immobility, no rigidity on my exam ). One alternate theory is a withdrawal/emergent dystonia after the anticholinergic drug quetiapine was decreased as onset of sx seems to be tied to that medication. Another possible reason the worsening tremor specifically is increase in free depakote in pt with general FTT and hypoalbuminemia although was moderate (2.8) and depakote level was subtherapeutic and in any case does not explain rigidity. Will restart abilify at lower dose to see if pt can tolerate this medication in presence of cogentin and re-evaluate pt daily for coming days.  ? ?Diagnoses:  ?Active Hospital problems: ?Principal Problem: ?  Hyperkalemia ?Active Problems: ?  Diabetes mellitus  type 2 in nonobese [facility]) ?  Sacral decubitus ulcer, stage II ([place]) ?  Hypoglycemia ?  ? ? ?Plan  ?## Safety and Observation Level:  ?- Based on my clinical evaluation, I estimate the patient to be at low risk of intentional self harm in the current setting ?- At this time, we recommend a routine level of observation. This decision is based on my review of the chart including patient's history and current presentation, interview of the patient, mental status examination, and consideration of suicide risk including evaluating suicidal ideation, plan, intent, suicidal or self-harm behaviors, risk factors, and protective factors. This judgment is based on our ability to directly address suicide risk, implement suicide prevention strategies and develop a safety plan while the patient is in the clinical setting. Please contact our team if there is a concern that risk level has changed. ? ? ?## Medications:  ?-- s abilify 10 mg for bipolar d/o ?- hold other psychotropics per neurology  ? ?## Medical Decision Making Capacity:  ?- pt's daughter is legal guardian ? ?## Further Work-up:  ?-- agree with w/u recommended by neurology team.  ?- consider serum iron (low serum iron assoc w/ inc likelyhood EPS) ? ? ?-- most recent EKG on 5/2 had QtC of 448 ?-- Pertinent labwork reviewed earlier this admission includes: CBC with low/nl MCV and elevated RDW, TSH wnl, ESR/sed rate both elevated, AM cortisol wnl, CK elevated, BMP with elevated creatinine, VPA subtherapeutic (since dc), CMP with hypoalbuminemia/mildly elevated AST ? ?## Disposition:  ?-- per primary. At this time it does not appear  he will need psychiatric hospitalization after medical hospitalization however pt fairly high risk and will reassess this through hospitalization.  ? ?##Legal Status ?-- pt has legal guardian and cannot leave AMA ? ? ?Thank you for this consult request. Recommendations have been communicated to the primary team.  We will continue to follow at  this time.  ? ?Joycelyn Schmid A Mikisha Roseland ? ? ?New history  ?Relevant Aspects of Hospital Course:  ?Admitted on 03/30/2022 for weakness and stiffness in b/l u/e. ? ?Patient Report:  ?Pt seen in early afternoon. He is alert, oriented to location, month, and year. Did fairly well on attention testing (DOWB) although did not attempt MOYB. Responses to most questions were appropriate but brief and it was fairly difficult to engage pt in back and forth conversation. ? ?He denies any SI, HI, or current/historic hallucinations. Thinks he was last manic "last January (ie 2022)" and has not been depressed for at least several months. States "you would not like me if I was manic" and generally endorses increased irritability, poor sleep, and "spending money I don't have". Brings up history of ECT and hospitalizations - alleges he got ECT in [place] as a teenager. Feels ECT is "cruel" and glad he has not needed it within the last year.  ? ? He provides much of psychiatric history below.  ? ? ?ROS:  ?Musculoskeletal pain, worst in shoulder and fingers.  ? ?Collateral information:  ?Spoke to Joellen Jersey who has transferred to [facility] but is very familiar with pt. Normally when unwell he gets catatonic but on more recent visits (about 3 weeks ago) was [employer]. Seemed more emotional, broke down and cried after people held open the door to him, starts spending money, etc. Daughter is worried about his physical health. Has had a lot of falls and isn't as mobile as he used to be.  ? ?Spoke to Dr. Jake Samples:  ?Over last 7 years has had at least 1 course of ECT to break a catatonia in addition to medical treatment for such. In 20s and 30s would get manic and violent, has always been very spiritual - hyperreligious in the past. Has done relatively well over last 4 years - a few manic episodes where he gets nasty, irritable, etc (dysphoric mania). Health has gotten much worse over last 4 years. Came out of hospital last time on abilify,  seroquel, depakote and wellbutrin which has kept him glued together. Had tolerated perphenazine 2-4 mg (intermediate potency 1st gen) in the past with no issues.  ? ?Spoke to ex-wife ?Has had some type of mental illness since high school. He was considered "manically depressed" when they met after college. In the past 8-10 years he has moved to being bipolar. Physical health has gotten worse - mental health has been "pretty good". Engaged, not thinking bad thoughts, interacting, going to the [facility], was doing really well until his body started to break down. Had been in a "very good space" with mental health for >6 months. Wife is worried that he will slip into mania if his medications are not restarted.  ? ?Spoke to daughter and ex-wife ?Informed of tx plan; they collectively provide consent.  ? 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 ? ? ? ?Pt made references to ECT treatments  in [place] at the age of 62, and got ECT as recently as Jan 2022.  ? ?Family psych history: unknown ? ? ?Social History:  ?Lives in independent elderly living, ex wife and daughter supportive ? ?Tobacco use: no ?Alcohol use: no ?Drug use: no ? ?Family History:  ?The patient's family history includes Arthritis in his father; Dementia in his mother; Diabetes in his sister; Hypertension in his father; Mental illness in his sister. ? ?Medical History: ?Past Medical History:  ?Diagnosis Date  ? Bilateral lower extremity edema: chronic with venous stasis changes 06/02/2014  ? Bipolar 1 disorder ([place])   ? Bipolar disorder ([place])   ? CHF (congestive heart failure) ([place])   ? Chronic kidney disease   ? Gout   ? Gout   ? Hx of blood clots   ? Hypertension   ? Mood swings   ? OSA on CPAP 06/02/2014   ? Other and unspecified hyperlipidemia 06/02/2014  ? Type II or unspecified type diabetes mellitus with unspecified complication, uncontrolled 06/02/2014  ? ? ?Surgical History: ?Past Surgical History:  ?P

## 2022-04-02 NOTE — Progress Notes (Signed)
?PROGRESS NOTE ? ? ? ?Christian [name]  [id number] DOB: Sep 18, 1951 DOA: 03/30/2022 ?PCP: Donald Prose, MD  ? ? ?Chief Complaint  ?Patient presents with  ? Weakness  ? ? ?Brief Narrative:  ? ?Christian [name] is a 71 y.o. male with history of peripheral arterial disease, bipolar disorder, diastolic CHF, chronic kidney disease, diabetes mellitus, sleep apnea, atrial fibrillation who was recently admitted for acute renal failure with hypoglycemia cellulitis eventually discharged to rehab and was discharged from rehab last week has been unable to ambulate and was feeling generally weak was brought to the ER.  Patient states since his last admission he has not ambulated.  Also has developed sacral decubitus ulcers.  Denies chest pain abdominal pain nausea vomiting patient states he has been eating well.  Patient complains of left shoulder pain due to rotator cuff tear. ?- In the ER patient is found to be hypoglycemic with blood sugar as low as 30s.  Patient lab work also show hyperkalemia with potassium of 6.5.  Creatinine is 1.9. On exam patient does have a sacral decubitus ulcer which does not look infected.  Has poor pulses in the lower extremity but no acute ischemic changes.  Patient was given Lokelma calcium gluconate and sodium bicarbonate.  Patient was started on D10 and D50 for hypoglycemia admitted for further work-up. ?  ? ?Assessment & Plan: ?  ?Principal Problem: ?  Hyperkalemia ?Active Problems: ?  Diabetes mellitus type 2 in nonobese [facility]) ?  Sacral decubitus ulcer, stage II ([place]) ?  Hypoglycemia ? ?Generalized weakness, tremors with increased rigidity  ?Acute dystonia versus NMS ?-Patient tachypneic, tachycardic, with low-grade temperature, increased muscle tone, mildly elevated total CK. ?-With history of bipolar disorder, on multiple medications including Abilify, Wellbutrin, Seroquel. ?-Continue with rizatriptan for possible tardive dyskinesia/extrapyramidal, continue to monitor, low threshold to  discontinue if he is more sedated or altered ?-Continue to hold above medications due to concern of NMS, neurology input greatly appreciated. ?-Psychiatry has been consulted as well regarding recommendation for alternative psychiatric regimen ? ?Hypoglycemia ?Diabetes mellitus type 2, uncontrolled with hypoglycemia ?-patient has had hypoglycemia during prior admission and patient states he has been eating well.  ?- Last hemoglobin A1c 2 weeks ago was 5.6. ?-Random cortisol 15.8,  appropriate. ?-Initially requiring D10W, this was discontinued given acceptable CBG currently. ?-Started on sensitive insulin sliding scale  ? ?Sinus tachycardia ?-Possibly related to an MS, continue to monitor on telemetry ?-As well patient with tachypnea, I have encouraged him to use incentive spirometer specially with atelectasis evident on chest x-ray ? ?Hyperkalemia  ?- with history of chronic kidney disease stage III we will get dietitian consult for renal diet.  Patient received sodium bicarbonate Lokelma and calcium gluconate.   ?-Most recent potassium appropriate at 4.5 ? 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 ?Peripheral vascular disease ?-  will need follow-up with vascular surgery. ? ?A-fib  ?- rate controlled on Xarelto. ? ?History of bipolar disorder  ?- on Depakote ,Abilify , Remeron, Wellbutrin and Seroquel. ?-Will hold for now, please see above discussion ? ?Chronic Diastolic CHF. ?-Instated, no evidence of volume overload, monitor closely as on IV fluids ? ?History of sleep apnea  ?- on CPAP. ? ?Sacral decubitus ulcer stage III-  does not look infected, wound care consulted ? ?Mildly elevated CK levels will hold statins for now.  Recheck CK levels after hydration. ? ?  Hypothyroidism  ?- on Cytomel. ?  ? ?DVT prophylaxis: Xarelto ?Code Status: Full ?Family Communication: Ex-wife at bedside ?Disposition:  ? ?Status is:  Inpatient ? ?  ?Consultants:  ?Neurology ? ? ?Subjective: ? ?No significant events overnight as discussed with staff, patient still complaining of left shoulder pain, he denies nausea, vomiting . ? ?Objective: ?Vitals:  ? 04/02/22 0324 04/02/22 0809 04/02/22 0900 04/02/22 1125  ?BP: (!) 89/75 (!) 88/74 90/67   ?Pulse: (!) 105 (!) 106 97   ?Resp: (!) 21 (!) 25 20   ?Temp: 98.6 ?F (37 ?C) 99.7 ?F (37.6 ?C)    ?TempSrc: Oral Oral    ?SpO2: 100% 100% 99%   ?Weight:    109.6 kg  ? ? ?Intake/Output Summary (Last 24 hours) at 04/02/2022 1445 ?Last data filed at 04/02/2022 0700 ?Gross per 24 hour  ?Intake 683.27 ml  ?Output 2050 ml  ?Net -1366.73 ml  ? ?Filed Weights  ? 04/02/22 1125  ?Weight: 109.6 kg  ? ? ?Examination: ? ?Awake Alert, frail, deconditioned, no apparent distress ?Symmetrical Chest wall movement, Good air movement bilaterally, CTAB ?Tachycardic.,No Gallops,Rubs or new Murmurs, No Parasternal Heave ?+ve B.Sounds, Abd Soft, No tenderness, No rebound - guarding or rigidity. ?No Cyanosis, Clubbing or edema, No new Rash or bruise   ? ? ? ? ? ?Data Reviewed: I have personally reviewed following labs and imaging studies ? ?CBC: ?Recent Labs  ?Lab 03/30/22 ?[id number] 03/31/22 ?0205 04/01/22 ?[id number] 04/02/22 ?[id number]  ?WBC 6.6 4.6 5.7 6.2  ?NEUTROABS 4.5  --   --   --   ?HGB 14.9 12.8* 13.2 13.2  ?HCT 46.7 39.6 42.5 41.9  ?MCV 83.1 82.5 83.7 83.0  ?PLT 297 227 239 235  ? ? ? ?Basic Metabolic Panel: ?Recent Labs  ?Lab 03/30/22 ?[id number] 03/31/22 ?0205 04/01/22 ?[id number] 04/02/22 ?[id number]  ?NA 134* 134* 133* 133*  ?K 6.5* 4.5 4.7 5.0  ?CL 100 101 100 101  ?CO2 24 27 26 25   ?GLUCOSE 65* 223* 196* 135*  ?BUN 76* 66* 44* 34*  ?CREATININE 1.98* 1.86* 1.63* 1.66*  ?CALCIUM 9.5 9.1 9.1 9.1  ? ? ? ?GFR: ?Estimated Creatinine Clearance: 54.5 mL/min (A) (by C-G formula based on SCr of 1.66 mg/dL (H)). ? ?Liver Function Tests: ?Recent Labs  ?Lab 03/30/22 ?1833  ?AST 45*  ?ALT 21  ?ALKPHOS 77  ?BILITOT 1.4*  ?PROT 7.4  ?ALBUMIN 2.8*  ? ? ? ?CBG: ?Recent  Labs  ?Lab 04/01/22 ?1203 04/01/22 ?1557 04/01/22 ?2118 04/02/22 ?[id number] 04/02/22 ?1144  ?GLUCAP 267* 283* 216* 127* 199*  ? ? ? ? ?No results found for this or any previous visit (from the past 240 hour(s)).  ? ? ? ? ? ?Radiology Studies: ?DG Shoulder Left Port ? ?Result Date: 04/01/2022 ?CLINICAL DATA:  Fall.  Left shoulder pain EXAM: LEFT SHOULDER COMPARISON:  None Available. FINDINGS: Negative for fracture or dislocation Moderate degenerative change in the shoulder joint. Rotator cuff impingement with spurring of the acromion and narrowing of the acromial distance. Possible rotator cuff tear. IMPRESSION: Degenerative change in the shoulder and AC joint. Marked  rotator cuff impingement. Electronically Signed   By: Franchot Gallo M.D.   On: 04/01/2022 14:35   ? ? ? ? ? ?Scheduled Meds: ? benztropine mesylate  1 mg Intravenous BID  ? bisacodyl  10 mg Oral Once  ? feeding supplement  237 mL Oral BID BM  ? liothyronine  10 mcg Oral Daily  ? multivitamin with minerals  1 tablet Oral Daily  ?  polyethylene glycol  34 g Oral Daily  ? rivaroxaban  20 mg Oral Q supper  ? senna-docusate  2 tablet Oral BID  ? ?Continuous Infusions: ? lactated ringers 50 mL/hr at 04/01/22 1143  ? ? ? LOS: 2 days  ? ? ? ? ? ?Phillips Climes, MD ?[facility] ? ? ?To contact the attending provider between 7A-7P or the covering provider during after hours 7P-7A, please log into the web site [email or web address] and access using universal [place] password for that web site. If you do not have the password, please call the hospital operator. ? ?04/02/2022, 2:45 PM  ?  ?

## 2022-04-02 NOTE — Progress Notes (Signed)
Physical Therapy Treatment ?Patient Details ?Name: Christian [name] ?MRN: [id number] ?DOB: 07/14/1951 ?Today's Date: 04/02/2022 ? ? ?History of Present Illness 71 y.o. male presented 5/2 with worsening strength , new decubitus ulcers and poor intake.  Recent admission for cellulitis and hypoglycemia, had just returned to ALF from SNF a week ago but not OOB since then.  Has new hypoglyemia, elevation of K+, creatinine, CK.  Ulcer is not infected but has LE skin is also irritated and has skin breakdown.  PMHx:  chronic venous stasis and chronic right shin unhealing wound, chronic HFpEF, IDDM, CAD with stenting x2 in 2021, PVD, CKD stage IIIa, HTN, DVT on Xarelto, bipolar disorder, OSA on CPAP at bedtime ? ?  ?PT Comments  ? ? Pt was seen for therapy with help to support sitting balance on side of bed.  PT was able to get him to this posture but pt cannot offer any real control of the movement yet.  Even so, he is able to get to sitting with help which is much more than he could do last visit.  Pt is a Product/process development scientist transfer, will require complete help to sit up in a chair but it is not clear he can safely get to more than a reclined posture in a chair.  Pt is also describing stiffness and discomfort  from his chronic bedbound situation recently.  Follow along to work toward better control of all movement as pt can tolerate and as his meds permit.   ?Recommendations for follow up therapy are one component of a multi-disciplinary discharge planning process, led by the attending physician.  Recommendations may be updated based on patient status, additional functional criteria and insurance authorization. ? ?Follow Up Recommendations ? Skilled nursing-short term rehab (<3 hours/day) ?  ?  ?Assistance Recommended at Discharge Frequent or constant Supervision/Assistance  ?Patient can return home with the following Two people to help with walking and/or transfers;Assistance with cooking/housework;Assist for transportation;Help with  stairs or ramp for entrance;Two people to help with bathing/dressing/bathroom;Assistance with feeding;Direct supervision/assist for medications management;Direct supervision/assist for financial management ?  ?Equipment Recommendations ? None recommended by PT  ?  ?Recommendations for Other Services   ? ? ?  ?Precautions / Restrictions Precautions ?Precautions: Fall ?Precaution Comments: monitor vitals, HR and BP esp ?Restrictions ?Weight Bearing Restrictions: No  ?  ? ?Mobility ? Bed Mobility ?Overal bed mobility: Needs Assistance ?Bed Mobility: Supine to Sit, Sit to Supine ?  ?  ?Supine to sit: Total assist ?Sit to supine: Total assist ?  ?General bed mobility comments: pt is less difficult than last visit to move, the trunk extension that has hindered him is less dramatic, being managed by MD with med changes ?  ? ?Transfers ?  ?  ?  ?  ?  ?  ?  ?  ?  ?General transfer comment: unsafe to try, total assist ?  ? ?Ambulation/Gait ?  ?  ?  ?  ?  ?  ?  ?  ? ? ?Stairs ?  ?  ?  ?  ?  ? ? ?Wheelchair Mobility ?  ? ?Modified Rankin (Stroke Patients Only) ?  ? ? ?  ?Balance Overall balance assessment: Needs assistance, History of Falls ?Sitting-balance support: Feet supported ?Sitting balance-Leahy Scale: Zero ?  ?  ?  ?  ?  ?  ?  ?  ?  ?  ?  ?  ?  ?  ?  ?  ?  ? ?  ?Cognition  Arousal/Alertness: Lethargic ?Behavior During Therapy: Flat affect ?Overall Cognitive Status: No family/caregiver present to determine baseline cognitive functioning ?  ?  ?  ?  ?  ?  ?  ?  ?  ?  ?  ?  ?  ?  ?  ?  ?General Comments: pt is more interactive but still very slow to respond to vc's ?  ?  ? ?  ?Exercises   ? ?  ?General Comments General comments (skin integrity, edema, etc.): pt is completely dependent on help to sit up, either is strongly leaning to the R or posteriorly leans ?  ?  ? ?Pertinent Vitals/Pain Pain Assessment ?Pain Assessment: Faces ?Faces Pain Scale: Hurts little more ?Pain Location: heels are painful ?Pain Descriptors /  Indicators: Grimacing, Guarding ?Pain Intervention(s): Limited activity within patient's tolerance, Monitored during session, Repositioned, Other (comment) (contact to nursing to see if soft boots are possible)  ? ? ?Home Living   ?  ?  ?  ?  ?  ?  ?  ?  ?  ?   ?  ?Prior Function    ?  ?  ?   ? ?PT Goals (current goals can now be found in the care plan section) Acute Rehab PT Goals ?Patient Stated Goal: to move and go home ?Progress towards PT goals: Progressing toward goals ? ?  ?Frequency ? ? ? Min 2X/week ? ? ? ?  ?PT Plan Current plan remains appropriate  ? ? ?Co-evaluation   ?  ?  ?  ?  ? ?  ?AM-PAC PT "6 Clicks" Mobility   ?Outcome Measure ? Help needed turning from your back to your side while in a flat bed without using bedrails?: Total ?Help needed moving from lying on your back to sitting on the side of a flat bed without using bedrails?: Total ?Help needed moving to and from a bed to a chair (including a wheelchair)?: Total ?Help needed standing up from a chair using your arms (e.g., wheelchair or bedside chair)?: Total ?Help needed to walk in hospital room?: Total ?Help needed climbing 3-5 steps with a railing? : Total ?6 Click Score: 6 ? ?  ?End of Session   ?Activity Tolerance: Patient limited by lethargy;Treatment limited secondary to medical complications (Comment) ?Patient left: in bed;with call bell/phone within reach;with bed alarm set ?Nurse Communication: Mobility status ?PT Visit Diagnosis: Unsteadiness on feet (R26.81);History of falling (Z91.81);Repeated falls (R29.6);Difficulty in walking, not elsewhere classified (R26.2) ?  ? ? ?Time: [id number] ?PT Time Calculation (min) (ACUTE ONLY): 23 min ? ?Charges:  $Therapeutic Activity: 23-37 mins     ?Ramond Dial ?04/02/2022, 5:15 PM ? ?Mee Hives, PT PhD ?Acute Rehab Dept. Number: [facility] [id number] and [place] [phone] ? ? ?

## 2022-04-02 NOTE — Progress Notes (Addendum)
Subjective: ?Patient resting in bed.  States that he is doing well today.  He feels like his muscle rigidity has improved some since yesterday.  He still continues to have some pain in his left shoulder but otherwise no other muscles are painful.  He denies any new symptoms today believes he is overall improving.  Feels as though he has more range of motion in his left upper extremity than yesterday. ? ?Exam: ?Vitals:  ? 04/02/22 0324 04/02/22 0809  ?BP: (!) 89/75 (!) 88/74  ?Pulse: (!) 105 (!) 106  ?Resp: (!) 21 (!) 25  ?Temp: 98.6 ?F (37 ?C) 99.7 ?F (37.6 ?C)  ?SpO2: 100% 100%  ? ?Gen: In bed, NAD ?Resp: non-labored breathing, no acute distress ?Abd: soft, nt ? ?Neuro: ?MS:  ?Patient is alert and oriented to person place time and event.  He still has difficulty with complex calculations and spelling world backwards. ?CN: ?No obvious cranial nerve deficits ?Motor:  ?Patient still has generalized weakness bu his muscle tone seems to be improving.  He is able to lift his left arm independently which is an improvement from yesterday, however he still has significantly decreased range of motion due to pain. ?Sensory: ?Sensations intact bilaterally ?DTR: ?Patient does have some upper extremity tremors but his upper extremity reflexes are 2+.  His lower extremity appears to be hyperreflexic but difficult to differentiate from tremor. ? ?Pertinent Labs: ? ?  Latest Ref Rng & Units 04/02/2022  ?  2:38 AM 04/01/2022  ?  7:58 AM 03/31/2022  ?  2:05 AM  ?CBC  ?WBC 4.0 - 10.5 K/uL 6.2   5.7   4.6    ?Hemoglobin 13.0 - 17.0 g/dL 13.2   13.2   12.8    ?Hematocrit 39.0 - 52.0 % 41.9   42.5   39.6    ?Platelets 150 - 400 K/uL 235   239   227    ? ? ?  Latest Ref Rng & Units 04/02/2022  ?  2:38 AM 04/01/2022  ?  7:58 AM 03/31/2022  ?  2:05 AM  ?CMP  ?Glucose 70 - 99 mg/dL 135   196   223    ?BUN 8 - 23 mg/dL 34   44   66    ?Creatinine 0.61 - 1.24 mg/dL 1.66   1.63   1.86    ?Sodium 135 - 145 mmol/L 133   133   134    ?Potassium 3.5 - 5.1  mmol/L 5.0   4.7   4.5    ?Chloride 98 - 111 mmol/L 101   100   101    ?CO2 22 - 32 mmol/L '25   26   27    ' ?Calcium 8.9 - 10.3 mg/dL 9.1   9.1   9.1    ? ?CRP: 11.4 ?ESR: 26 ? ? ?Impression: ? 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  Difficult to determine if he is altered from baseline as he is responding to questions appropriately however unable to perform calculations and spell world backwards.  He has been off his antipsychotic medications and endorses improvement of his muscle tone.  His CK was mildly elevated with elevated inflammatory markers making acute dystonia or NMS likely.  Continue to hold off on additional antipsychotic medications until his symptoms improve. ? ?Recommendations: ?1) Acute dystonia versus NMS: ?- Hold antipsychotic medications ?- Psychiatry consult for medication recommendations  ?- Trial of  benztropine, hold if patient becomes more sedated or altered ?- PT/OT as tolerated.  ? ?Christian [name], D.O.  ?Internal Medicine Resident, PGY-3 ?[name]  ?11:01 AM, 04/02/2022  ? ? ?I have seen the patient and reviewed the above note.  After discussion with psychiatry, with a recent reduction in Seroquel, there is some anticholinergic activity of Seroquel which may play a role in unmasking of underlying dystonia.  We started benztropine yesterday, and he does seem slightly better today, though in the setting of someone who is already improving prior to that unclear how much is the medication and how much is simply natural course. ? ?The cadence of his tremor is also fairly typical for Depakote associated tremor, but I would not favor making too many changes all at the same time. ? ?His elevated CK is in a range that is difficult to interpret.  It is certainly not markedly elevated as is typically seen within the mass, but I still think that that is a consideration.  His sed  rate was only mildly elevated, and I think polymyalgia rheumatica is unlikely. ? ?Very much appreciate psychiatry's involvement and we will continue continue to follow, understanding that neuroleptics are likely to be needed given his severe history of psychiatric disease. ? ?Christian Rack, MD ?[facility] ?[phone] ? ?If 7pm- 7am, please page neurology on call as listed in [place]. ? ?

## 2022-04-03 DIAGNOSIS — M6289 Other specified disorders of muscle: Secondary | ICD-10-CM | POA: Diagnosis not present

## 2022-04-03 DIAGNOSIS — E162 Hypoglycemia, unspecified: Secondary | ICD-10-CM | POA: Diagnosis not present

## 2022-04-03 DIAGNOSIS — R748 Abnormal levels of other serum enzymes: Secondary | ICD-10-CM | POA: Diagnosis not present

## 2022-04-03 DIAGNOSIS — R627 Adult failure to thrive: Secondary | ICD-10-CM | POA: Diagnosis not present

## 2022-04-03 DIAGNOSIS — E119 Type 2 diabetes mellitus without complications: Secondary | ICD-10-CM | POA: Diagnosis not present

## 2022-04-03 DIAGNOSIS — L89152 Pressure ulcer of sacral region, stage 2: Secondary | ICD-10-CM | POA: Diagnosis not present

## 2022-04-03 DIAGNOSIS — G210 Malignant neuroleptic syndrome: Secondary | ICD-10-CM | POA: Diagnosis not present

## 2022-04-03 DIAGNOSIS — E875 Hyperkalemia: Secondary | ICD-10-CM | POA: Diagnosis not present

## 2022-04-03 DIAGNOSIS — F3189 Other bipolar disorder: Secondary | ICD-10-CM | POA: Diagnosis not present

## 2022-04-03 LAB — BASIC METABOLIC PANEL
Anion gap: 5 (ref 5–15)
BUN: 32 mg/dL — ABNORMAL HIGH (ref 8–23)
CO2: 25 mmol/L (ref 22–32)
Calcium: 9.2 mg/dL (ref 8.9–10.3)
Chloride: 102 mmol/L (ref 98–111)
Creatinine, Ser: 1.75 mg/dL — ABNORMAL HIGH (ref 0.61–1.24)
GFR, Estimated: 42 mL/min — ABNORMAL LOW (ref >60)
Glucose, Bld: 175 mg/dL — ABNORMAL HIGH (ref 70–99)
Potassium: 5.3 mmol/L — ABNORMAL HIGH (ref 3.5–5.1)
Sodium: 132 mmol/L — ABNORMAL LOW (ref 135–145)

## 2022-04-03 LAB — GLUCOSE, CAPILLARY
Glucose-Capillary: 110 mg/dL — ABNORMAL HIGH (ref 70–99)
Glucose-Capillary: 216 mg/dL — ABNORMAL HIGH (ref 70–99)
Glucose-Capillary: 192 mg/dL — ABNORMAL HIGH (ref 70–99)
Glucose-Capillary: 137 mg/dL — ABNORMAL HIGH (ref 70–99)

## 2022-04-03 MED ORDER — BISACODYL 5 MG PO TBEC
10.0000 mg | DELAYED_RELEASE_TABLET | Freq: Every day | ORAL | Status: DC | PRN
  Administered 2022-04-03 – 2022-05-11 (×2): 10 mg via ORAL
  Filled 2022-04-03 (×2): qty 2

## 2022-04-03 MED ORDER — SODIUM ZIRCONIUM CYCLOSILICATE 10 G PO PACK
10.0000 g | PACK | Freq: Every day | ORAL | Status: AC
  Administered 2022-04-03: 10 g via ORAL
  Filled 2022-04-03: qty 1

## 2022-04-03 NOTE — Progress Notes (Signed)
?   04/03/22 1200  ?Assess: MEWS Score  ?Temp 97.9 ?F (36.6 ?C)  ?BP 98/72  ?Pulse Rate 97  ?ECG Heart Rate 97  ?Resp (!) 25  ?SpO2 91 %  ?Assess: MEWS Score  ?MEWS Temp 0  ?MEWS Systolic 1  ?MEWS Pulse 0  ?MEWS RR 1  ?MEWS LOC 0  ?MEWS Score 2  ?MEWS Score Color Yellow  ?Assess: if the MEWS score is Yellow or Red  ?Were vital signs taken at a resting state? Yes  ?Focused Assessment No change from prior assessment  ?Early Detection of Sepsis Score *See Row Information* Medium  ?MEWS guidelines implemented *See Row Information* Yes  ?Take Vital Signs  ?Increase Vital Sign Frequency  Yellow: Q 2hr X 2 then Q 4hr X 2, if remains yellow, continue Q 4hrs  ?Escalate  ?MEWS: Escalate Yellow: discuss with charge nurse/RN and consider discussing with provider and RRT  ?Notify: Charge Nurse/RN  ?Name of Charge Nurse/RN Notified LIsa  ?Date Charge Nurse/RN Notified 04/03/22  ?Time Charge Nurse/RN Notified 1323  ?Document  ?Patient Outcome Other (Comment) ?(pt stable)  ? ? ?

## 2022-04-03 NOTE — Consult Note (Signed)
[facility] New Face-to-Face Psychiatric Evaluation ? ? ?Service Date: Apr 03, 2022 ?LOS:  LOS: 3 days  ? ? ?Assessment  ?Christian [name] is a 71 y.o. male admitted medically for 03/30/2022  5:04 PM for worsening weakness . He carries the psychiatric diagnoses of bipolar I disorder and has a past medical history of CKD, bilateral l/e edema, gout, hx blood clots, HTN, OSA on CPAP, HLD, DMII. Psychiatry was consulted for reinitiation of psychotropic medications by Dr. Waldron Labs at the request of Dr. Leonel Ramsay in neurology.  ? ?This is a complex pt with a long history of mental illness; has been on psychotropic medications in some form for 50+ years. 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 From collateral with family and ACT team, has mentally been doing quite well on current regimen for last 6+ months with multiple previous failed trials. Recommendations balancing likely decompensation if off of psychotropics against worsening stiffness. ? ?With above history, it is not tenable to leave pt off of mood stabilizing medications [name] with concern for NMS. His current presentation is not consistent with NMS (no HTN, focal rather than diffuse dystonia/stiffness, only low grade fevers). Catatonia was considered but pt does not screen + on brief BFRS (only + item was immobility, no rigidity on my exam). One alternate theory is a withdrawal emergent dystonia after the anticholinergic drug quetiapine was decreased as onset of sx seems to be tied to that medication. Another possible reason the worsening tremor specifically is increase in free depakote in pt with general FTT and hypoalbuminemia although was moderate (2.8) and depakote level was subtherapeutic and in any case does not explain rigidity. Will restart abilify at lower dose to see if pt can tolerate this medication in presence of cogentin and re-evaluate pt daily for coming days.  ? ?5/6: no real  change in physical exam after resumption of abilify, will have pt seen 5/8 or 5/9 if still inpt (will review chart AM of 5/7 and see if requested).  ? ?Diagnoses:  ?Active Hospital problems: ?Principal Problem: ?  Hyperkalemia ?Active Problems: ?  Diabetes mellitus type 2 in nonobese [facility]) ?  Sacral decubitus ulcer, stage II ([place]) ?  Hypoglycemia ?  ? ? ?Plan  ?## Safety and Observation Level:  ?- Based on my clinical evaluation, I estimate the patient to be at low risk of intentional self harm in the current setting ?- At this time, we recommend a routine level of observation. This decision is based on my review of the chart including patient's history and current presentation, interview of the patient, mental status examination, and consideration of suicide risk including evaluating suicidal ideation, plan, intent, suicidal or self-harm behaviors, risk factors, and protective factors. This judgment is based on our ability to directly address suicide risk, implement suicide prevention strategies and develop a safety plan while the patient is in the clinical setting. Please contact our team if there is a concern that risk level has changed. ? ? ?## Medications:  ?-- c abilify 10 mg for bipolar d/o (most efficacious historical agent, among least likely antipsychotics to worsen dystonia) ?-- continue to hold hold other psychotropics per neurology  ? ?## Medical Decision Making Capacity:  ?- pt's daughter is legal guardian ? ?## Further Work-up:  ?-- agree with w/u recommended by neurology team.  ? ?-- most recent EKG on 5/2 had QtC of 448 ?-- Pertinent labwork reviewed earlier this admission includes: CBC  with low/nl MCV and elevated RDW, TSH wnl, ESR/sed rate both elevated, AM cortisol wnl, CK elevated, BMP with elevated creatinine, VPA subtherapeutic (since dc), CMP with hypoalbuminemia/mildly elevated AST ?-- update 5/6: serum iron and ferritin WNL.  ? ?## Disposition:  ?-- per primary. At this time it does not  appear he will need psychiatric hospitalization after medical hospitalization however pt fairly high risk and will reassess this through hospitalization.  ? ?##Legal Status ?-- pt has legal guardian and cannot leave AMA ? ? ?Thank you for this consult request. Recommendations have been communicated to the primary team.  We will continue to follow at this time.  ? ?Joycelyn Schmid A Chidiebere Wynn ? ? ?New history  ?Relevant Aspects of Hospital Course:  ?Admitted on 03/30/2022 for weakness and stiffness in b/l u/e. ? ?Patient Report:  ?Pt seen in late AM. He remains alert, oriented to location, month, and year. Did fairly well on attention testing (DOWB) but could not spell world backwards.  Seemed a little more open, easier to converse with than yesterday. Denies any ongoing sx of depression - mostly concerned about physical health. Feels tremor is a little better than yesterday. Some ongoing stiffness which seems to be moslty 2/2 pain - on physical exam pt tenses up (clearly volitional) but has full ROM with no rigidity (but some mild cogwheeling) on b/l u/e.  ? ? ?ROS:  ?Ongoing musculoskeletal pain and weakness.  ? ?Collateral information:  ?Spoke to Joellen Jersey who has transferred to [facility] but is very familiar with pt. Normally when unwell he gets catatonic but on more recent visits (about 3 weeks ago) was [employer]. Seemed more emotional, broke down and cried after people held open the door to him, starts spending money, etc. Daughter is worried about his physical health. Has had a lot of falls and isn't as mobile as he used to be.  ? ?Spoke to Dr. Jake Samples:  ?Over last 7 years has had at least 1 course of ECT to break a catatonia in addition to medical treatment for such. In 20s and 30s would get manic and violent, has always been very spiritual - hyperreligious in the past. Has done relatively well over last 4 years - a few manic episodes where he gets nasty, irritable, etc (dysphoric mania). Health has gotten much  worse over last 4 years. Came out of hospital last time on abilify, seroquel, depakote and wellbutrin which has kept him glued together. Had tolerated perphenazine 2-4 mg (intermediate potency 1st gen) in the past with no issues.  ? ?Spoke to ex-wife ?Has had some type of mental illness since high school. He was considered "manically depressed" when they met after college. In the past 8-10 years he has moved to being bipolar. Physical health has gotten worse - mental health has been "pretty good". Engaged, not thinking bad thoughts, interacting, going to the [facility], was doing really well until his body started to break down. Had been in a "very good space" with mental health for >6 months. Wife is worried that he will slip into mania if his medications are not restarted.  ? ?Spoke to daughter and ex-wife ?Informed of tx plan; they collectively provide consent.  ? 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 ? ? ? ?Pt made references  to ECT treatments in [place] at the age of 37, and got ECT as recently as Jan 2022.  ? ?Family psych history: unknown ? ? ?Social History:  ?Lives in independent elderly living, ex wife and daughter supportive ? ?Tobacco use: no ?Alcohol use: no ?Drug use: no ? ?Family History:  ?The patient's family history includes Arthritis in his father; Dementia in his mother; Diabetes in his sister; Hypertension in his father; Mental illness in his sister. ? ?Medical History: ?Past Medical History:  ?Diagnosis Date  ? Bilateral lower extremity edema: chronic with venous stasis changes 06/02/2014  ? Bipolar 1 disorder ([place])   ? Bipolar disorder ([place])   ? CHF (congestive heart failure) ([place])   ? Chronic kidney disease   ? Gout   ? Gout   ? Hx of  blood clots   ? Hypertension   ? Mood swings   ? OSA on CPAP 06/02/2014  ? Other and unspecified hyperlipidemia 06/02/2014  ? Type II or unspecified type diabetes mellitus with unspecified complication, uncontroll

## 2022-04-03 NOTE — Progress Notes (Signed)
Patient's rigidity has continued to improve.  He does have some tremor, but this also appears improved my exam today. ? ?Given his improvement after starting benztropine, I do wonder if this may be EPS that was previously being masked by Seroquel prior to that being reduced.  Given that his CKs were never very high, I do think that an MS is relatively unlikely at this time. ? ?Appreciate psychiatry assistance, at this time I will defer to psychiatry for resumption and choice of medications.   ? ?Roland Rack, MD ?[facility] ?[phone] ? ?If 7pm- 7am, please page neurology on call as listed in [place]. ? ?

## 2022-04-03 NOTE — Progress Notes (Signed)
?PROGRESS NOTE ? ? ? ?Phillip Maffei Siegel  KZS:[id number] DOB: 10/27/1951 DOA: 03/30/2022 ?PCP: [name], MD  ? ? ?Chief Complaint  ?Patient presents with  ? Weakness  ? ? ?Brief Narrative:  ? ?Christian [name] is a 71 y.o. male with history of peripheral arterial disease, bipolar disorder, diastolic CHF, chronic kidney disease, diabetes mellitus, sleep apnea, atrial fibrillation who was recently admitted for acute renal failure with hypoglycemia cellulitis eventually discharged to rehab and was discharged from rehab last week has been unable to ambulate and was feeling generally weak was brought to the ER.  Patient states since his last admission he has not ambulated.  Also has developed sacral decubitus ulcers.  Denies chest pain abdominal pain nausea vomiting patient states he has been eating well.  Patient complains of left shoulder pain due to rotator cuff tear. ?- In the ER patient is found to be hypoglycemic with blood sugar as low as 30s.  Patient lab work also show hyperkalemia with potassium of 6.5.  Creatinine is 1.9. On exam patient does have a sacral decubitus ulcer which does not look infected.  Has poor pulses in the lower extremity but no acute ischemic changes.  Patient was given Lokelma calcium gluconate and sodium bicarbonate.  Patient was started on D10 and D50 for hypoglycemia admitted for further work-up. ?  ? ?Assessment & Plan: ?  ?Principal Problem: ?  Hyperkalemia ?Active Problems: ?  Diabetes mellitus type 2 in nonobese [facility]) ?  Sacral decubitus ulcer, stage II (HCC) ?  Hypoglycemia ? 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 ?-With history of bipolar disorder, on multiple medications including Abilify, Wellbutrin, Seroquel. ?-Concerns forNMS, neurology, psychiatry consult greatly appreciated. 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 ? ? ?Hypoglycemia ?Diabetes mellitus type 2, uncontrolled with hypoglycemia ?-patient has had hypoglycemia during prior admission and patient states he has been eating well.  ?- Last hemoglobin A1c 2 weeks ago was 5.6. ?-Random cortisol 15.8,  appropriate. ?-Initially requiring D10W, this was discontinued given acceptable CBG currently. ?-Started on sensitive insulin sliding scale  ? ?Sinus tachycardia ?-Possibly related to an MS, continue to monitor on telemetry ?-As well patient with tachypnea, I have encouraged him to use incentive spirometer specially with atelectasis evident on chest x-ray ? ?Hyperkalemia  ?- with history of chronic kidney disease stage III we will get dietitian consult for renal diet.  Patient received sodium bicarbonate Lokelma and calcium gluconate.   ?-potassium  is trending up again at 5.3, will give Lokelma today. ? 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 ?Peripheral vascular disease ?-  will need follow-up with vascular surgery. ? ?A-fib  ?- rate controlled on Xarelto. ? ?History of bipolar disorder  ?- on Depakote ,Abilify , Remeron, Wellbutrin and Seroquel. ?-Will hold for now, please see above discussion ? ?Chronic Diastolic CHF. ?-Instated, no evidence of volume overload, monitor closely as on IV fluids ? ?History of sleep apnea  ?- on CPAP. ? ?Sacral decubitus ulcer stage III-  does not look infected, wound care consulted ? ?Mildly elevated CK levels will hold statins for  now.  Recheck CK levels after hydration. ? ?Hypothyroidism  ?- on Cytomel. ? ?Left shoulder pain ?-chronic, secondary to rotator cuff injury per patient. ? ?  ? ?DVT prophylaxis:  Xarelto ?Code Status: Full ?Family Communication: None  at bedside ?Disposition:  ? ?Status is: Inpatient ? ?  ?Consultants:  ?Neurology ? ? ?Subjective: ? ?No significant events overnight as discussed with staff, patient still complaining of left shoulder pain, no nausea, no vomiting.   ? ? ?Objective: ?Vitals:  ? 04/03/22 0745 04/03/22 0800 04/03/22 1200 04/03/22 1320  ?BP: 114/84 114/86 98/72 116/70  ?Pulse: 84 84 97 100  ?Resp: 18 20 (!) 25 (!) 34  ?Temp: 97.6 ?F (36.4 ?C)  97.9 ?F (36.6 ?C) 97.6 ?F (36.4 ?C)  ?TempSrc: Oral  Oral Oral  ?SpO2: 96% 98% 91%   ?Weight:      ? ? ?Intake/Output Summary (Last 24 hours) at 04/03/2022 1448 ?Last data filed at 04/03/2022 0012 ?Gross per 24 hour  ?Intake --  ?Output 1400 ml  ?Net -1400 ml  ? ?Filed Weights  ? 04/02/22 1125  ?Weight: 109.6 kg  ? ? ?Examination: ? ?Awake Alert,frail,  deconditioned ?Symmetrical Chest wall movement, Good air movement bilaterally, CTAB ?RRR,No Gallops,Rubs or new Murmurs, No Parasternal Heave ?+ve B.Sounds, Abd Soft, No tenderness, No rebound - guarding or rigidity. ?No Cyanosis, Clubbing or edema, No new Rash or bruise   ? ? ? ? ? ? ?Data Reviewed: I have personally reviewed following labs and imaging studies ? ?CBC: ?Recent Labs  ?Lab 03/30/22 ?[id number] 03/31/22 ?0205 04/01/22 ?0349 04/02/22 ?1791  ?WBC 6.6 4.6 5.7 6.2  ?NEUTROABS 4.5  --   --   --   ?HGB 14.9 12.8* 13.2 13.2  ?HCT 46.7 39.6 42.5 41.9  ?MCV 83.1 82.5 83.7 83.0  ?PLT 297 227 239 235  ? ? ?Basic Metabolic Panel: ?Recent Labs  ?Lab 03/30/22 ?5056 03/31/22 ?0205 04/01/22 ?9794 04/02/22 ?8016 04/03/22 ?0126  ?NA 134* 134* 133* 133* 132*  ?K 6.5* 4.5 4.7 5.0 5.3*  ?CL 100 101 100 101 102  ?CO2 24 27 26 25 25   ?GLUCOSE 65* 223* 196* 135* 175*  ?BUN 76* 66* 44* 34* 32*  ?CREATININE 1.98* 1.86* 1.63* 1.66* 1.75*  ?CALCIUM 9.5 9.1 9.1 9.1 9.2  ? ? ?GFR: ?Estimated Creatinine Clearance: 51.7 mL/min (A) (by C-G formula based on SCr of 1.75 mg/dL (H)). ? ?Liver Function Tests: ?Recent Labs   ?Lab 03/30/22 ?1833  ?AST 45*  ?ALT 21  ?ALKPHOS 77  ?BILITOT 1.4*  ?PROT 7.4  ?ALBUMIN 2.8*  ? ? ?CBG: ?Recent Labs  ?Lab 04/02/22 ?1144 04/02/22 ?1616 04/02/22 ?2028 04/03/22 ?5537 04/03/22 ?1214  ?GLUCAP 199* 171* 155* 110* 216*  ? ? ? ?No results found for this or any previous visit (from the past 240 hour(s)).  ? ? ? ? ? ?Radiology Studies: ?No results found. ? ? ? ? ? ?Scheduled Meds: ? ARIPiprazole  10 mg Oral q morning  ? benztropine mesylate  1 mg Intravenous BID  ? bisacodyl  10 mg Oral Once  ? feeding supplement  237 mL Oral BID BM  ? insulin aspart  0-9 Units Subcutaneous TID WC  ? liothyronine  10 mcg Oral Daily  ? multivitamin with minerals  1 tablet Oral Daily  ? polyethylene glycol  34 g Oral Daily  ? rivaroxaban  20 mg Oral Q supper  ? senna-docusate  2 tablet Oral BID  ? ?Continuous Infusions: ? lactated ringers 50 mL/hr at 04/01/22 1143  ? ? ?  LOS: 3 days  ? ? ? ? ? ?Huey Bienenstock, MD ?[facility] ? ? ?To contact the attending provider between 7A-7P or the covering provider during after hours 7P-7A, please log into the web site [email or web address] and access using universal [place] password for that web site. If you do not have the password, please call the hospital operator. ? ?04/03/2022, 2:48 PM  ?  ?

## 2022-04-04 DIAGNOSIS — E119 Type 2 diabetes mellitus without complications: Secondary | ICD-10-CM | POA: Diagnosis not present

## 2022-04-04 DIAGNOSIS — R748 Abnormal levels of other serum enzymes: Secondary | ICD-10-CM | POA: Diagnosis not present

## 2022-04-04 DIAGNOSIS — E875 Hyperkalemia: Secondary | ICD-10-CM | POA: Diagnosis not present

## 2022-04-04 DIAGNOSIS — G210 Malignant neuroleptic syndrome: Secondary | ICD-10-CM | POA: Diagnosis not present

## 2022-04-04 DIAGNOSIS — E162 Hypoglycemia, unspecified: Secondary | ICD-10-CM | POA: Diagnosis not present

## 2022-04-04 DIAGNOSIS — L89152 Pressure ulcer of sacral region, stage 2: Secondary | ICD-10-CM | POA: Diagnosis not present

## 2022-04-04 DIAGNOSIS — R627 Adult failure to thrive: Secondary | ICD-10-CM | POA: Diagnosis not present

## 2022-04-04 LAB — BASIC METABOLIC PANEL
Anion gap: 8 (ref 5–15)
BUN: 29 mg/dL — ABNORMAL HIGH (ref 8–23)
CO2: 26 mmol/L (ref 22–32)
Calcium: 9.4 mg/dL (ref 8.9–10.3)
Chloride: 100 mmol/L (ref 98–111)
Creatinine, Ser: 1.47 mg/dL — ABNORMAL HIGH (ref 0.61–1.24)
GFR, Estimated: 52 mL/min — ABNORMAL LOW (ref >60)
Glucose, Bld: 140 mg/dL — ABNORMAL HIGH (ref 70–99)
Potassium: 5.3 mmol/L — ABNORMAL HIGH (ref 3.5–5.1)
Sodium: 134 mmol/L — ABNORMAL LOW (ref 135–145)

## 2022-04-04 LAB — GLUCOSE, CAPILLARY
Glucose-Capillary: 121 mg/dL — ABNORMAL HIGH (ref 70–99)
Glucose-Capillary: 170 mg/dL — ABNORMAL HIGH (ref 70–99)
Glucose-Capillary: 295 mg/dL — ABNORMAL HIGH (ref 70–99)
Glucose-Capillary: 244 mg/dL — ABNORMAL HIGH (ref 70–99)

## 2022-04-04 MED ORDER — MELATONIN 5 MG PO TABS
10.0000 mg | ORAL_TABLET | Freq: Every evening | ORAL | Status: DC | PRN
  Administered 2022-04-05 – 2022-05-16 (×13): 10 mg via ORAL
  Filled 2022-04-04 (×14): qty 2

## 2022-04-04 MED ORDER — SODIUM ZIRCONIUM CYCLOSILICATE 10 G PO PACK
10.0000 g | PACK | Freq: Every day | ORAL | Status: AC
  Administered 2022-04-04: 10 g via ORAL
  Filled 2022-04-04: qty 1

## 2022-04-04 MED ORDER — BISACODYL 5 MG PO TBEC
10.0000 mg | DELAYED_RELEASE_TABLET | Freq: Once | ORAL | Status: AC
  Administered 2022-04-04: 10 mg via ORAL
  Filled 2022-04-04: qty 2

## 2022-04-04 MED ORDER — POLYETHYLENE GLYCOL 3350 17 G PO PACK
34.0000 g | PACK | Freq: Two times a day (BID) | ORAL | Status: AC
  Administered 2022-04-04 – 2022-04-07 (×7): 34 g via ORAL
  Filled 2022-04-04 (×9): qty 2

## 2022-04-04 MED ORDER — HYDROCODONE-ACETAMINOPHEN 5-325 MG PO TABS
1.0000 | ORAL_TABLET | ORAL | Status: DC | PRN
  Administered 2022-04-04 – 2022-05-19 (×30): 1 via ORAL
  Filled 2022-04-04 (×32): qty 1

## 2022-04-04 NOTE — Progress Notes (Signed)
?PROGRESS NOTE ? ? ? ?Christian [name]  [id number] DOB: [birth year] DOA: 03/30/2022 ?PCP: Donald Prose, MD  ? ? ?Chief Complaint  ?Patient presents with  ? Weakness  ? ? ?Brief Narrative:  ? ?Christian [name] is a 71 y.o. male with history of peripheral arterial disease, bipolar disorder, diastolic CHF, chronic kidney disease, diabetes mellitus, sleep apnea, atrial fibrillation who was recently admitted for acute renal failure with hypoglycemia cellulitis eventually discharged to rehab and was discharged from rehab last week has been unable to ambulate and was feeling generally weak was brought to the ER.  Patient states since his last admission he has not ambulated.  Also has developed sacral decubitus ulcers.  Denies chest pain abdominal pain nausea vomiting patient states he has been eating well.  Patient complains of left shoulder pain due to rotator cuff tear. ?- In the ER patient is found to be hypoglycemic with blood sugar as low as 30s.  Patient lab work also show hyperkalemia with potassium of 6.5.  Creatinine is 1.9. On exam patient does have a sacral decubitus ulcer which does not look infected.  Has poor pulses in the lower extremity but no acute ischemic changes.  Patient was given Lokelma calcium gluconate and sodium bicarbonate.  Patient was started on D10 and D50 for hypoglycemia admitted for further work-up. ?  ? ?Assessment & Plan: ?  ?Principal Problem: ?  Hyperkalemia ?Active Problems: ?  Diabetes mellitus type 2 in nonobese [facility]) ?  Sacral decubitus ulcer, stage II ([place]) ?  Hypoglycemia ? 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 ?-With history of bipolar disorder, on multiple medications including Abilify, Wellbutrin, Seroquel. ?-Concerns for NMS, neurology, psychiatry consult greatly appreciated. 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 ? ? ?Hypoglycemia ?Diabetes mellitus type 2, uncontrolled with hypoglycemia ?-patient has had hypoglycemia during prior admission and patient states he has been eating well.  ?- Last hemoglobin A1c 2 weeks ago was 5.6. ?-Random cortisol 15.8,  appropriate. ?-Initially requiring D10W, this was discontinued given acceptable CBG currently. ?-Started on sensitive insulin sliding scale  ? ?Sinus tachycardia ?-Possibly related to an MS, continue to monitor on telemetry ?-As well patient with tachypnea, I have encouraged him to use incentive spirometer specially with atelectasis evident on chest x-ray ? ?Hyperkalemia  ?-Remains elevated at 5.3, will give Lokelma again today.. ? 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 ?Peripheral vascular disease ?-  will need follow-up with vascular surgery. ? ?A-fib  ?- rate controlled on Xarelto. ? ?History of bipolar disorder  ?- on Depakote ,Abilify , Remeron, Wellbutrin and Seroquel. ?-Will hold for now, please see above discussion ? ?Chronic Diastolic CHF. ?-Instated, no evidence of volume overload, monitor closely as on IV fluids ? ?History of sleep apnea  ?- on CPAP. ? ?Sacral decubitus ulcer stage III-  does not look infected, wound care consulted ? ?Mildly elevated CK levels will hold statins for now.  Recheck CK levels after hydration. ? ?Hypothyroidism  ?- on Cytomel. ? ?Left shoulder pain ?-chronic, secondary to rotator cuff injury per patient. ? ?  ? ?DVT prophylaxis:  Xarelto ?Code Status: Full ?Family Communication: None  at bedside ?Disposition:  ? ?Status is: Inpatient ? ?  ?Consultants:  ?Neurology ? ? ?Subjective: ? ?No significant  events overnight as discussed with staff, patient still complaining of left shoulder pain, no nausea, no vomiting.   ? ? ?Objective: ?Vitals:  ? 04/03/22 2100 04/04/22 0000 04/04/22 0400 04/04/22 0800  ?BP: 106/70 (!) 89/64 124/79 121/81  ?Pulse: 97 92 88 85  ?Resp: (!) 23 15 19 18   ?Temp:   98.3 ?F (36.8 ?C)   ?TempSrc:  Oral Axillary   ?SpO2: (!) 87% 100% 100% 98%  ?Weight:      ? ? ?Intake/Output Summary (Last 24 hours) at 04/04/2022 1406 ?Last data filed at 04/04/2022 0500 ?Gross per 24 hour  ?Intake 3535.03 ml  ?Output 900 ml  ?Net 2635.03 ml  ? ?Filed Weights  ? 04/02/22 1125  ?Weight: 109.6 kg  ? ? ?Examination: ? ?Awake Alert, Oriented X 3, extremely frail, deconditioned ?Symmetrical Chest wall movement, Good air movement bilaterally, CTAB ?RRR,No Gallops,Rubs or new Murmurs, No Parasternal Heave ?+ve B.Sounds, Abd Soft, No tenderness, No rebound - guarding or rigidity. ?No Cyanosis, Clubbing or edema, No new Rash or bruise   ? ? ? ? ? ? ?Data Reviewed: I have personally reviewed following labs and imaging studies ? ?CBC: ?Recent Labs  ?Lab 03/30/22 ?[id number] 03/31/22 ?0205 04/01/22 ?[id number] 04/02/22 ?[id number]  ?WBC 6.6 4.6 5.7 6.2  ?NEUTROABS 4.5  --   --   --   ?HGB 14.9 12.8* 13.2 13.2  ?HCT 46.7 39.6 42.5 41.9  ?MCV 83.1 82.5 83.7 83.0  ?PLT 297 227 239 235  ? ? ?Basic Metabolic Panel: ?Recent Labs  ?Lab 03/31/22 ?0205 04/01/22 ?[id number] 04/02/22 ?[id number] 04/03/22 ?0126 04/04/22 ?[id number]  ?NA 134* 133* 133* 132* 134*  ?K 4.5 4.7 5.0 5.3* 5.3*  ?CL 101 100 101 102 100  ?CO2 27 26 25 25 26   ?GLUCOSE 223* 196* 135* 175* 140*  ?BUN 66* 44* 34* 32* 29*  ?CREATININE 1.86* 1.63* 1.66* 1.75* 1.47*  ?CALCIUM 9.1 9.1 9.1 9.2 9.4  ? ? ?GFR: ?Estimated Creatinine Clearance: 61.5 mL/min (A) (by C-G formula based on SCr of 1.47 mg/dL (H)). ? ?Liver Function Tests: ?Recent Labs  ?Lab 03/30/22 ?1833  ?AST 45*  ?ALT 21  ?ALKPHOS 77  ?BILITOT 1.4*  ?PROT 7.4  ?ALBUMIN 2.8*  ? ? ?CBG: ?Recent Labs  ?Lab 04/03/22 ?1214 04/03/22 ?1704 04/03/22 ?2134  04/04/22 ?0900 04/04/22 ?1246  ?GLUCAP 216* 192* 137* 121* 170*  ? ? ? ?No results found for this or any previous visit (from the past 240 hour(s)).  ? ? ? ? ? ?Radiology Studies: ?No results found. ? ? ? ? ? ?Scheduled Meds: ? ARIPiprazole  10 mg Oral q morning  ? benztropine mesylate  1 mg Intravenous BID  ? bisacodyl  10 mg Oral Once  ? feeding supplement  237 mL Oral BID BM  ? insulin aspart  0-9 Units Subcutaneous TID WC  ? liothyronine  10 mcg Oral Daily  ? multivitamin with minerals  1 tablet Oral Daily  ? polyethylene glycol  34 g Oral BID  ? rivaroxaban  20 mg Oral Q supper  ? senna-docusate  2 tablet Oral BID  ? ?Continuous Infusions: ? lactated ringers 50 mL/hr at 04/03/22 2055  ? ? ? LOS: 4 days  ? ? ? ? ? ?Phillips Climes, MD ?[facility] ? ? ?To contact the attending provider between 7A-7P or the covering provider  during after hours 7P-7A, please log into the web site [email or web address] and access using universal [place] password for that web site. If you do not have the password, please call the hospital operator. ? ?04/04/2022, 2:06 PM  ?  ?

## 2022-04-04 NOTE — Progress Notes (Signed)
PT refusing cpap. No resp distress. Will continue to monitor.  ?

## 2022-04-05 DIAGNOSIS — E875 Hyperkalemia: Secondary | ICD-10-CM | POA: Diagnosis not present

## 2022-04-05 DIAGNOSIS — L89152 Pressure ulcer of sacral region, stage 2: Secondary | ICD-10-CM | POA: Diagnosis not present

## 2022-04-05 DIAGNOSIS — E119 Type 2 diabetes mellitus without complications: Secondary | ICD-10-CM | POA: Diagnosis not present

## 2022-04-05 LAB — GLUCOSE, CAPILLARY
Glucose-Capillary: 215 mg/dL — ABNORMAL HIGH (ref 70–99)
Glucose-Capillary: 218 mg/dL — ABNORMAL HIGH (ref 70–99)
Glucose-Capillary: 275 mg/dL — ABNORMAL HIGH (ref 70–99)
Glucose-Capillary: 204 mg/dL — ABNORMAL HIGH (ref 70–99)

## 2022-04-05 MED ORDER — BENZTROPINE MESYLATE 1 MG PO TABS
1.0000 mg | ORAL_TABLET | Freq: Two times a day (BID) | ORAL | Status: DC
  Administered 2022-04-05 – 2022-05-16 (×82): 1 mg via ORAL
  Filled 2022-04-05 (×89): qty 1

## 2022-04-05 NOTE — TOC Progression Note (Addendum)
Transition of Care (TOC) - Progression Note  ? ? ?Patient Details  ?Name: Christian [name] ?MRN: [id number] ?Date of Birth: March 16, 1951 ? ?Transition of Care (TOC) CM/SW Contact  ?Benard Halsted, LCSW ?Phone Number: ?04/05/2022, 9:44 AM ? ?Clinical Narrative:    ?9:30am-CSW spoke with patient's daughter and provided SNF bed offers. Her concern is still that they do not want patient to go to SNF long term and want him to rehab and return home. However, she is aware that [employer] will likely not approve him for rehab for long and thus they need Medicaid in place to help with the cost of the out of pocket co pays. CSW explained that if patient improves after 30 days where Medicaid pays, he could still leave the facility and have the check revert back to him. She will speak with her mother and call CSW back this afternoon. CSW checking with [facility] to expand bed offers.  ? ?2pm-CSW received call from patient's daughter and ex-wife. CSW answered questions. [facility] unable to accept patient.  ? ? ?Expected Discharge Plan: [place] ?Barriers to Discharge: Continued Medical Work up, Ship broker, SNF Pending bed offer ? ?Expected Discharge Plan and Services ?Expected Discharge Plan: [place] ?In-house Referral: Clinical Social Work ?  ?Post Acute Care Choice: [place] ?Living arrangements for the past 2 months: [place], [place] ?                ?  ?  ?  ?  ?  ?  ?  ?  ?  ?  ? ? ?Social Determinants of Health (SDOH) Interventions ?  ? ?Readmission Risk Interventions ? ?  01/28/2022  ? 10:17 AM  ?Readmission Risk Prevention Plan  ?Transportation Screening Complete  ?PCP or Specialist Appt within 5-7 Days Complete  ?Home Care Screening Complete  ?Medication Review (RN CM) Complete  ? ? ?

## 2022-04-05 NOTE — Consult Note (Signed)
[facility] Face-to-Face Psychiatry Consult  ? ?Reason for Consult: 71 year old man with a history of bipolar disorder, multiple medications including Depakote, Abilify, Seroquel, possible NMS, neurology recommends psych consult regarding alternative treatments.  ?Referring Physician:  Elgerawy ?Patient Identification: Christian [name] ?MRN:  [id number] ?Principal Diagnosis: Hyperkalemia ?Diagnosis:  Principal Problem: ?  Hyperkalemia ?Active Problems: ?  Diabetes mellitus type 2 in nonobese [facility]) ?  Sacral decubitus ulcer, stage II (HCC) ?  Hypoglycemia ? ? ?Total Time spent with patient: 30 minutes ? ?Subjective:   ?Christian [name] is a 71 y.o. male patient admitted with altered mental status. He states from a mental perspective  " I am doing fine/ ok. " Psychiatric was consulted form re initiation of psychotropic medications, 2/t suspected NMS and or catatonia. Patient continues to deny any symptoms associated with either at this time. He denies any stiffness noting it has gotten better since yesterday. He contributes his improvement to his fluids. Clinically his vital signs appear to be within normal limits, no dystonia or stiffness on physical exam. He reports compliance with his medication Abilify, continues to deny Miralax, stating he has taken more than enough doses and will allow the medications to work.  ? ?On evaluation patient is alert and oriented, calm and cooperative, very pleasant upon approach.  He is noted to be lying in bed, with call bell in his hand in reach. Patient denies any access to weapons, denies any alcohol and or substance abuse.  He reports moderate sleep and good appetite. He tells me he ate " pancakes and sausage for breakfast. Where is my lunch it should be here now? "  He also is receiving services through an act team, and his daughter is available to assist with medication management in which he reports compliance with most of his appointments.  Patient denies any auditory and/or visual  hallucinations, does not appear to be responding to internal or external stimuli.  There is no evidence of delusional thought content and patient appears to answer all questions appropriately.  At this time patient appears to be psychiatrically stable, and will be cleared at this time.    ? ? ?HPI: Christian [name] is a 71 y.o. male with history of peripheral arterial disease, bipolar disorder, diastolic CHF, chronic kidney disease, diabetes mellitus, sleep apnea, atrial fibrillation who was recently admitted for acute renal failure with hypoglycemia cellulitis eventually discharged to rehab and was discharged from rehab last week has been unable to ambulate and was feeling generally weak was brought to the ER.  Patient states since his last admission he has not ambulated.  Also has developed sacral decubitus ulcers.  Denies chest pain abdominal pain nausea vomiting patient states he has been eating well.  Patient complains of left shoulder pain due to rotator cuff tear ? ?Past Psychiatric History: Patient has a long history of chronic illness.  He had a normal functional adult life except for episodes of depression until perhaps around 10 years ago at which point his symptoms started to run together in such a way that he became completely impaired.  He has responded variously to ECT.  4 years ago we were doing ECT here in the hospital with no benefit.  [facility] then went [facility] where he [facility] reportedly with some benefit.  His recent stay in [place] he reportedly did benefit from ECT which was where we were when he was referred for maintenance treatment.  In my experience he tends to have mixed symptoms  with agitation and depression although there have been times when he is much more energetic and manic like.  He has been aggressive and threatening to others in the past when psychotic.  Also when psychotic he becomes unable to care for himself. ? ?Risk to Self:  Denies ?Risk to Others:  Denies ?Prior  Inpatient Therapy:   Multiple inpatient hospitalizations ?Prior Outpatient Therapy:   ACT team ? ?Past Medical History:  ?Past Medical History:  ?Diagnosis Date  ? Bilateral lower extremity edema: chronic with venous stasis changes 06/02/2014  ? Bipolar 1 disorder (HCC)   ? Bipolar disorder (HCC)   ? CHF (congestive heart failure) (HCC)   ? Chronic kidney disease   ? Gout   ? Gout   ? Hx of blood clots   ? Hypertension   ? Mood swings   ? OSA on CPAP 06/02/2014  ? Other and unspecified hyperlipidemia 06/02/2014  ? Type II or unspecified type diabetes mellitus with unspecified complication, uncontrolled 06/02/2014  ?  ?Past Surgical History:  ?Procedure Laterality Date  ? ABDOMINAL AORTOGRAM W/LOWER EXTREMITY N/A 09/16/2021  ? Procedure: ABDOMINAL AORTOGRAM W/LOWER EXTREMITY;  Surgeon: Victorino Sparrow, MD;  Location: [facility] INVASIVE CV LAB;  Service: Cardiovascular;  Laterality: N/A;  ? LEFT HEART CATH AND CORONARY ANGIOGRAPHY Left 04/01/2020  ? Procedure: LEFT HEART CATH AND CORONARY ANGIOGRAPHY;  Surgeon: Laurier Nancy, MD;  Location: [facility] INVASIVE CV LAB;  Service: Cardiovascular;  Laterality: Left;  ? PERIPHERAL VASCULAR BALLOON ANGIOPLASTY Left 09/16/2021  ? Procedure: PERIPHERAL VASCULAR BALLOON ANGIOPLASTY;  Surgeon: Victorino Sparrow, MD;  Location: [facility] INVASIVE CV LAB;  Service: Cardiovascular;  Laterality: Left;  attempted to PTA posterior tibial artery; unable to cross lesion  ? ?Family History:  ?Family History  ?Problem Relation Age of Onset  ? Dementia Mother   ? Arthritis Father   ? Hypertension Father   ? Mental illness Sister   ? Diabetes Sister   ? Heart failure Neg Hx   ? Kidney failure Neg Hx   ? Cancer Neg Hx   ? ?Family Psychiatric  History: None known ?Social History:  ?Social History  ? ?Substance and Sexual Activity  ?Alcohol Use No  ?   ?Social History  ? ?Substance and Sexual Activity  ?Drug Use No  ?  ?Social History  ? ?Socioeconomic History  ? Marital status: Divorced  ?  Spouse name: Not on file  ?  Number of children: Not on file  ? Years of education: Not on file  ? Highest education level: Not on file  ?Occupational History  ? Not on file  ?Tobacco Use  ? Smoking status: Never  ? Smokeless tobacco: Never  ?Vaping Use  ? Vaping Use: Never used  ?Substance and Sexual Activity  ? Alcohol use: No  ? Drug use: No  ? Sexual activity: Not Currently  ?Other Topics Concern  ? Not on file  ?Social History Narrative  ? *The patient is divorced and is currently on disability. He lives alone and has one daughter.  ?   ?   ?    ? ?Social Determinants of Health  ? ?Financial Resource Strain: Not on file  ?Food Insecurity: Not on file  ?Transportation Needs: Not on file  ?Physical Activity: Not on file  ?Stress: Not on file  ?Social Connections: Not on file  ? ?Additional Social History: ?  ? ?Allergies:   ?Allergies  ?Allergen Reactions  ? Amlodipine Swelling and Other (See Comments)  ?  Legs became swollen  ? Onglyza [Saxagliptin] Other (See Comments)  ?  "Allergic," per [facility]  ? Zyprexa [Olanzapine] Other (See Comments)  ?  Allergy noted by ACT Team. No reaction specified (??)- "Allergic," per [facility]  ? Ritalin [Methylphenidate] Anxiety and Other (See Comments)  ?  "[place]"  ? ? ?Labs:  ?Results for orders placed or performed during the hospital encounter of 03/30/22 (from the past 48 hour(s))  ?Glucose, capillary     Status: Abnormal  ? Collection Time: 04/03/22  5:04 PM  ?Result Value Ref Range  ? Glucose-Capillary 192 (H) 70 - 99 mg/dL  ?  Comment: Glucose reference range applies only to samples taken after fasting for at least 8 hours.  ?Glucose, capillary     Status: Abnormal  ? Collection Time: 04/03/22  9:34 PM  ?Result Value Ref Range  ? Glucose-Capillary 137 (H) 70 - 99 mg/dL  ?  Comment: Glucose reference range applies only to samples taken after fasting for at least 8 hours.  ?Basic metabolic panel     Status: Abnormal  ? Collection Time: 04/04/22  1:08 AM  ?Result Value Ref Range  ?  Sodium 134 (L) 135 - 145 mmol/L  ? Potassium 5.3 (H) 3.5 - 5.1 mmol/L  ? Chloride 100 98 - 111 mmol/L  ? CO2 26 22 - 32 mmol/L  ? Glucose, Bld 140 (H) 70 - 99 mg/dL  ?  Comment: Glucose reference ran

## 2022-04-05 NOTE — Progress Notes (Signed)
?PROGRESS NOTE ? ? ? ?Christian [name]  [id number] DOB: Apr 12, 1951 DOA: 03/30/2022 ?PCP: Donald Prose, MD  ? ? ?Chief Complaint  ?Patient presents with  ? Weakness  ? ? ?Brief Narrative:  ? ?Christian [name] is a 71 y.o. male with history of peripheral arterial disease, bipolar disorder, diastolic CHF, chronic kidney disease, diabetes mellitus, sleep apnea, atrial fibrillation who was recently admitted for acute renal failure with hypoglycemia cellulitis eventually discharged to rehab and was discharged from rehab last week has been unable to ambulate and was feeling generally weak was brought to the ER.  Patient states since his last admission he has not ambulated.  Also has developed sacral decubitus ulcers.  Denies chest pain abdominal pain nausea vomiting patient states he has been eating well.  Patient complains of left shoulder pain due to rotator cuff tear. ?- In the ER patient is found to be hypoglycemic with blood sugar as low as 30s.  Patient lab work also show hyperkalemia with potassium of 6.5.  Creatinine is 1.9. On exam patient does have a sacral decubitus ulcer which does not look infected.  Has poor pulses in the lower extremity but no acute ischemic changes.  Patient was given Lokelma calcium gluconate and sodium bicarbonate.  Patient was started on D10 and D50 for hypoglycemia admitted for further work-up. ?  ? ?Assessment & Plan: ?  ?Principal Problem: ?  Hyperkalemia ?Active Problems: ?  Diabetes mellitus type 2 in nonobese [facility]) ?  Sacral decubitus ulcer, stage II ([place]) ?  Hypoglycemia ? 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 ?-With history of bipolar disorder, on multiple medications including Abilify, Wellbutrin, Seroquel. ?-Concerns for NMS, neurology, psychiatry consult greatly appreciated. ?-antipsychotic  medications has been held initially, findings likely more related for extrapyramidal syndrome started to develop once his Seroquel has been tapered as an outpatient, so patient is resumed back on Abilify, and to continue with Cogentin. ?-Patient appears to be improving on current regimen of Abilify and Cogentin, medication adjustment per psychiatry. ? ? ?Hypoglycemia ?Diabetes mellitus type 2, uncontrolled with hypoglycemia ?-patient has had hypoglycemia during prior admission and patient states he has been eating well.  ?- Last hemoglobin A1c 2 weeks ago was 5.6. ?-Random cortisol 15.8,  appropriate. ?-Initially requiring D10W, this was discontinued given acceptable CBG currently. ?-Started on sensitive insulin sliding scale  ? ?Sinus tachycardia ?-Possibly related to an MS, continue to monitor on telemetry ?-As well patient with tachypnea, I have encouraged him to use incentive spirometer specially with atelectasis evident on chest x-ray ? ?Hyperkalemia  ?-Remains elevated at 5.3, will give Lokelma again today.. ? 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 ?Peripheral vascular disease ?-  will need follow-up with vascular surgery. ? ?A-fib  ?- rate controlled on Xarelto. ? ?History of bipolar disorder  ?- on Depakote ,Abilify , Remeron, Wellbutrin and Seroquel. ?-Will hold for now, please see above discussion ? ?Chronic Diastolic CHF. ?-Instated, no evidence of volume overload, monitor closely as on IV fluids ? ?History of sleep apnea  ?- on CPAP. ? ?Sacral decubitus ulcer stage III-  does not look infected, wound care consulted ? ?Mildly elevated CK levels will hold statins for now.  Recheck CK levels after hydration. ? ?Hypothyroidism  ?- on Cytomel. ? ?Left  shoulder pain ?-chronic, secondary to rotator cuff injury per patient. ? ?  ? ?DVT prophylaxis: Xarelto ?Code Status: Full ?Family Communication: None  at  bedside, discussed with daughter/legal guardian by phone ?Disposition:  ? ?Status is: Inpatient ? ?  ?Consultants:  ?Neurology ? ? ?Subjective: ? ?Acute events overnight, he denies any complaints ? ? ?Objective: ?Vitals:  ? 04/05/22 0500 04/05/22 0600 04/05/22 0825 04/05/22 1250  ?BP:  129/75 123/73 119/71  ?Pulse: 86 86 92 89  ?Resp: 18 20 19  (!) 21  ?Temp:   98.3 ?F (36.8 ?C) 98.4 ?F (36.9 ?C)  ?TempSrc:   Oral Oral  ?SpO2: 100% 99% 100% 99%  ?Weight:      ? ? ?Intake/Output Summary (Last 24 hours) at 04/05/2022 1308 ?Last data filed at 04/05/2022 1251 ?Gross per 24 hour  ?Intake 437 ml  ?Output 1501 ml  ?Net -1064 ml  ? ?Filed Weights  ? 04/02/22 1125  ?Weight: 109.6 kg  ? ? ?Examination: ? ?Awake Alert, Oriented X 3, frail, deconditioned  ?Symmetrical Chest wall movement, Good air movement bilaterally, CTAB ?RRR,No Gallops,Rubs or new Murmurs, No Parasternal Heave ?+ve B.Sounds, Abd Soft, No tenderness, No rebound - guarding or rigidity. ?No Cyanosis, Clubbing or edema, No new Rash or bruise   ? ? ? ? ? ? ?Data Reviewed: I have personally reviewed following labs and imaging studies ? ?CBC: ?Recent Labs  ?Lab 03/30/22 ?[id number] 03/31/22 ?0205 04/01/22 ?[id number] 04/02/22 ?[id number]  ?WBC 6.6 4.6 5.7 6.2  ?NEUTROABS 4.5  --   --   --   ?HGB 14.9 12.8* 13.2 13.2  ?HCT 46.7 39.6 42.5 41.9  ?MCV 83.1 82.5 83.7 83.0  ?PLT 297 227 239 235  ? ? ?Basic Metabolic Panel: ?Recent Labs  ?Lab 03/31/22 ?0205 04/01/22 ?[id number] 04/02/22 ?[id number] 04/03/22 ?0126 04/04/22 ?[id number]  ?NA 134* 133* 133* 132* 134*  ?K 4.5 4.7 5.0 5.3* 5.3*  ?CL 101 100 101 102 100  ?CO2 27 26 25 25 26   ?GLUCOSE 223* 196* 135* 175* 140*  ?BUN 66* 44* 34* 32* 29*  ?CREATININE 1.86* 1.63* 1.66* 1.75* 1.47*  ?CALCIUM 9.1 9.1 9.1 9.2 9.4  ? ? ?GFR: ?Estimated Creatinine Clearance: 61.5 mL/min (A) (by C-G formula based on SCr of 1.47 mg/dL (H)). ? ?Liver Function Tests: ?Recent Labs  ?Lab 03/30/22 ?1833  ?AST 45*  ?ALT 21  ?ALKPHOS 77  ?BILITOT 1.4*  ?PROT 7.4  ?ALBUMIN 2.8*   ? ? ?CBG: ?Recent Labs  ?Lab 04/04/22 ?1246 04/04/22 ?1545 04/04/22 ?2035 04/05/22 ?0820 04/05/22 ?1255  ?GLUCAP 170* 295* 244* 215* 218*  ? ? ? ?No results found for this or any previous visit (from the past 240 hour(s)).  ? ? ? ? ? ?Radiology Studies: ?No results found. ? ? ? ? ? ?Scheduled Meds: ? ARIPiprazole  10 mg Oral q morning  ? benztropine  1 mg Oral BID  ? bisacodyl  10 mg Oral Once  ? feeding supplement  237 mL Oral BID BM  ? insulin aspart  0-9 Units Subcutaneous TID WC  ? liothyronine  10 mcg Oral Daily  ? multivitamin with minerals  1 tablet Oral Daily  ? polyethylene glycol  34 g Oral BID  ? rivaroxaban  20 mg Oral Q supper  ? senna-docusate  2 tablet Oral BID  ? ?Continuous Infusions: ? lactated ringers 50 mL/hr at 04/04/22 1641  ? ? ? LOS: 5 days  ? ? ? ? ? ?Phillips Climes, MD ?[facility] ? ? ?To contact the  attending provider between 7A-7P or the covering provider during after hours 7P-7A, please log into the web site [email or web address] and access using universal [place] password for that web site. If you do not have the password, please call the hospital operator. ? ?04/05/2022, 1:08 PM  ?  ?

## 2022-04-05 NOTE — Progress Notes (Signed)
Placed patient on home CPAP, home settings.  ?

## 2022-04-06 DIAGNOSIS — L89152 Pressure ulcer of sacral region, stage 2: Secondary | ICD-10-CM | POA: Diagnosis not present

## 2022-04-06 DIAGNOSIS — E875 Hyperkalemia: Secondary | ICD-10-CM | POA: Diagnosis not present

## 2022-04-06 DIAGNOSIS — E119 Type 2 diabetes mellitus without complications: Secondary | ICD-10-CM | POA: Diagnosis not present

## 2022-04-06 LAB — CBC
HCT: 42.1 % (ref 39.0–52.0)
Hemoglobin: 13.1 g/dL (ref 13.0–17.0)
MCH: 26.3 pg (ref 26.0–34.0)
MCHC: 31.1 g/dL (ref 30.0–36.0)
MCV: 84.4 fL (ref 80.0–100.0)
Platelets: 222 10*3/uL (ref 150–400)
RBC: 4.99 MIL/uL (ref 4.22–5.81)
RDW: 16.8 % — ABNORMAL HIGH (ref 11.5–15.5)
WBC: 5.8 10*3/uL (ref 4.0–10.5)
nRBC: 0 % (ref 0.0–0.2)

## 2022-04-06 LAB — GLUCOSE, CAPILLARY
Glucose-Capillary: 189 mg/dL — ABNORMAL HIGH (ref 70–99)
Glucose-Capillary: 249 mg/dL — ABNORMAL HIGH (ref 70–99)
Glucose-Capillary: 296 mg/dL — ABNORMAL HIGH (ref 70–99)
Glucose-Capillary: 206 mg/dL — ABNORMAL HIGH (ref 70–99)

## 2022-04-06 LAB — BASIC METABOLIC PANEL
Anion gap: 6 (ref 5–15)
BUN: 31 mg/dL — ABNORMAL HIGH (ref 8–23)
CO2: 25 mmol/L (ref 22–32)
Calcium: 9.3 mg/dL (ref 8.9–10.3)
Chloride: 101 mmol/L (ref 98–111)
Creatinine, Ser: 1.37 mg/dL — ABNORMAL HIGH (ref 0.61–1.24)
GFR, Estimated: 56 mL/min — ABNORMAL LOW (ref >60)
Glucose, Bld: 215 mg/dL — ABNORMAL HIGH (ref 70–99)
Potassium: 5.5 mmol/L — ABNORMAL HIGH (ref 3.5–5.1)
Sodium: 132 mmol/L — ABNORMAL LOW (ref 135–145)

## 2022-04-06 MED ORDER — BISACODYL 5 MG PO TBEC
10.0000 mg | DELAYED_RELEASE_TABLET | Freq: Once | ORAL | Status: AC
  Administered 2022-04-06: 10 mg via ORAL
  Filled 2022-04-06: qty 2

## 2022-04-06 MED ORDER — SODIUM ZIRCONIUM CYCLOSILICATE 10 G PO PACK
10.0000 g | PACK | Freq: Two times a day (BID) | ORAL | Status: DC
  Administered 2022-04-06 – 2022-04-13 (×14): 10 g via ORAL
  Filled 2022-04-06 (×15): qty 1

## 2022-04-06 MED ORDER — BISACODYL 10 MG RE SUPP
10.0000 mg | Freq: Two times a day (BID) | RECTAL | Status: AC
  Administered 2022-04-06: 10 mg via RECTAL
  Filled 2022-04-06 (×2): qty 1

## 2022-04-06 NOTE — Progress Notes (Signed)
?PROGRESS NOTE ? ? ? ?Christian [name]  [id number] DOB: 05/03/1951 DOA: 03/30/2022 ?PCP: Donald Prose, MD  ? ? ?Chief Complaint  ?Patient presents with  ? Weakness  ? ? ?Brief Narrative:  ? ?Christian [name] is a 71 y.o. male with history of peripheral arterial disease, bipolar disorder, diastolic CHF, chronic kidney disease, diabetes mellitus, sleep apnea, atrial fibrillation who was recently admitted for acute renal failure with hypoglycemia cellulitis eventually discharged to rehab and was discharged from rehab last week has been unable to ambulate and was feeling generally weak was brought to the ER.  Patient states since his last admission he has not ambulated.  Also has developed sacral decubitus ulcers.  Work-up was significant for hypoglycemia, hyperkalemia, admitted for further work-up,. ? ?Assessment & Plan: ?  ?Principal Problem: ?  Hyperkalemia ?Active Problems: ?  Diabetes mellitus type 2 in nonobese [facility]) ?  Sacral decubitus ulcer, stage II ([place]) ?  Hypoglycemia ? 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 ?-With history of bipolar disorder, on multiple medications including Abilify, Wellbutrin, Seroquel. ?-Concerns for NMS, neurology, psychiatry consult greatly appreciated. ?-antipsychotic medications has been held initially, findings likely more related for extrapyramidal syndrome started to develop once his Seroquel has been tapered as an outpatient, so patient is resumed back on Abilify, and to continue with Cogentin. ?-Patient appears to be improving on current regimen of Abilify and Cogentin, medication adjustment per psychiatry. ?- Have discussed with psychiatry, final recommendation is to keep holding Seroquel, Wellbutrin, Depakote and Remeron, and to keep him on current dose of Abilify and Cogentin, and he will  need to follow-up with ACTT as an outpatient. ? ?Hypoglycemia ?Diabetes mellitus type 2, uncontrolled with hypoglycemia ?-patient has had hypoglycemia during prior admission and patient states he has been eating well.  ?- Last hemoglobin A1c 2 weeks ago was 5.6. ?-Random cortisol 15.8,  appropriate. ?-Initially requiring D10W, this was discontinued given acceptable CBG currently. ?-Started on sensitive insulin sliding scale  ? ?Sinus tachycardia ?-Possibly related to an MS, continue to monitor on telemetry ?-As well patient with tachypnea, I have encouraged him to use incentive spirometer specially with atelectasis evident on chest x-ray ? ?Hyperkalemia  ?-Increasing to 5.5 today despite receiving Lokelma yesterday, will increase Lokelma to 10 mg p.o. twice daily.   ? ? 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 ?Peripheral vascular disease ?-  will need follow-up with vascular surgery. ? ?A-fib  ?- rate controlled on Xarelto. ? ?History of bipolar disorder  ?- on Depakote ,Abilify , Remeron, Wellbutrin and Seroquel. ?-Will hold for now, please see above discussion ? ?Chronic Diastolic CHF. ?-Instated, no evidence of volume overload, monitor closely as on IV fluids ? ?History of sleep apnea  ?- on CPAP. ? ?Sacral decubitus ulcer stage III-  does not look infected, wound care consulted ? ?Mildly elevated CK levels will hold statins for now.  Recheck CK levels after hydration. ? ?Hypothyroidism  ?- on Cytomel. ? ?Left shoulder pain ?-chronic, secondary to rotator cuff injury per patient. ? ?  ? ?DVT prophylaxis: Xarelto ?Code Status: Full ?Family Communication: None  at bedside, discussed with daughter/legal guardian by phone 5/8 ?Disposition:  ? ?Status is: Inpatient ? ?  ?Consultants:  ?Neurology ? ? ?Subjective: ? ?No acute events overnight,  patient denies any complaints today, he had a good bowel movement this  morning. ? ?Objective: ?Vitals:  ? 04/06/22 0807 04/06/22 1216 04/06/22 1246 04/06/22 1441  ?BP: 92/62 (!) 122/98 117/79 96/62  ?Pulse: 91 (!) 117 (!) 110 100  ?Resp: 15 17 16  (!) 23  ?Temp: 98.7 ?F (37.1 ?C) 98 ?F (36.7 ?C) 98.1 ?F (36.7 ?C) 97.8 ?F (36.6 ?C)  ?TempSrc: Oral Oral Oral Oral  ?SpO2: 100% 100% 100% 100%  ?Weight:      ? ? ?Intake/Output Summary (Last 24 hours) at 04/06/2022 1534 ?Last data filed at 04/06/2022 0930 ?Gross per 24 hour  ?Intake 2773.06 ml  ?Output 1900 ml  ?Net 873.06 ml  ? ?Filed Weights  ? 04/02/22 1125  ?Weight: 109.6 kg  ? ? ?Examination: ? ?Awake Alert, Oriented X 3, weak, deconditioned, frail ?Symmetrical Chest wall movement, Good air movement bilaterally, CTAB ?RRR,No Gallops,Rubs or new Murmurs, No Parasternal Heave ?+ve B.Sounds, Abd Soft, No tenderness, No rebound - guarding or rigidity. ?No Cyanosis, Clubbing or edema, No new Rash or bruise   ? ? ? ? ? ? ?Data Reviewed: I have personally reviewed following labs and imaging studies ? ?CBC: ?Recent Labs  ?Lab 03/30/22 ?[id number] 03/31/22 ?0205 04/01/22 ?[id number] 04/02/22 ?[id number] 04/06/22 ?0413  ?WBC 6.6 4.6 5.7 6.2 5.8  ?NEUTROABS 4.5  --   --   --   --   ?HGB 14.9 12.8* 13.2 13.2 13.1  ?HCT 46.7 39.6 42.5 41.9 42.1  ?MCV 83.1 82.5 83.7 83.0 84.4  ?PLT 297 227 239 235 222  ? ? ?Basic Metabolic Panel: ?Recent Labs  ?Lab 04/01/22 ?[id number] 04/02/22 ?0238 04/03/22 ?0126 04/04/22 ?[id number] 04/06/22 ?0413  ?NA 133* 133* 132* 134* 132*  ?K 4.7 5.0 5.3* 5.3* 5.5*  ?CL 100 101 102 100 101  ?CO2 26 25 25 26 25   ?GLUCOSE 196* 135* 175* 140* 215*  ?BUN 44* 34* 32* 29* 31*  ?CREATININE 1.63* 1.66* 1.75* 1.47* 1.37*  ?CALCIUM 9.1 9.1 9.2 9.4 9.3  ? ? ?GFR: ?Estimated Creatinine Clearance: 66 mL/min (A) (by C-G formula based on SCr of 1.37 mg/dL (H)). ? ?Liver Function Tests: ?Recent Labs  ?Lab 03/30/22 ?1833  ?AST 45*  ?ALT 21  ?ALKPHOS 77  ?BILITOT 1.4*  ?PROT 7.4  ?ALBUMIN 2.8*  ? ? ?CBG: ?Recent Labs  ?Lab 04/05/22 ?1255 04/05/22 ?1632 04/05/22 ?2238  04/06/22 ?0809 04/06/22 ?1219  ?GLUCAP 218* 275* 204* 189* 249*  ? ? ? ?No results found for this or any previous visit (from the past 240 hour(s)).  ? ? ? ? ? ?Radiology Studies: ?No results found. ? ? ? ? ? ?Scheduled Meds: ? ARIPiprazole  10 mg Oral q morning  ? benztropine  1 mg Oral BID  ? bisacodyl  10 mg Oral Once  ? bisacodyl  10 mg Rectal BID  ? feeding supplement  237 mL Oral BID BM  ? insulin aspart  0-9 Units Subcutaneous TID WC  ? liothyronine  10 mcg Oral Daily  ? multivitamin with minerals  1 tablet Oral Daily  ? polyethylene glycol  34 g Oral BID  ? rivaroxaban  20 mg Oral Q supper  ? senna-docusate  2 tablet Oral BID  ? sodium zirconium cyclosilicate  10 g Oral BID  ? ?Continuous Infusions: ? ? ? ? LOS: 6 days  ? ? ? ? ? ?Phillips Climes, MD ?[facility] ? ? ?To contact the attending provider between 7A-7P or the covering provider during after hours 7P-7A, please log  into the web site [email or web address] and access using universal [place] password for that web site. If you do not have the password, please call the hospital operator. ? ?04/06/2022, 3:34 PM  ?  ?

## 2022-04-06 NOTE — Progress Notes (Signed)
Patient has home CPAP at bedside. Refused for HS. States "it sucked the life out of him last night"  ?

## 2022-04-06 NOTE — Progress Notes (Signed)
Inpatient Diabetes Program Recommendations ? ?AACE/ADA: New Consensus Statement on Inpatient Glycemic Control  ? ?Target Ranges:  Prepandial:   less than 140 mg/dL ?     Peak postprandial:   less than 180 mg/dL (1-2 hours) ?     Critically ill patients:  140 - 180 mg/dL  ? ? Latest Reference Range & Units 04/05/22 08:20 04/05/22 12:55 04/05/22 16:32 04/05/22 22:38 04/06/22 08:09  ?Glucose-Capillary 70 - 99 mg/dL 215 (H) 218 (H) 275 (H) 204 (H) 189 (H)  ? ?Review of Glycemic Control ? ?Diabetes history: DM2 ?Outpatient Diabetes medications: Jardiance 10 mg daily, Amaryl 2 mg QAM, Lantus 5 units daily ?Current orders for Inpatient glycemic control: Novolog 0-9 units TID with meals ? ?Inpatient Diabetes Program Recommendations:   ? ?Insulin: Please consider ordering Novolog 2 units TID with meals for meal coverage if patient eats at least 50% of meals. ? ?Thanks, ?Barnie Alderman, RN, MSN, CDE ?Diabetes Coordinator ?Inpatient Diabetes Program ?[phone] (Team Pager from 8am to 5pm) ? ? ? ?

## 2022-04-06 NOTE — Progress Notes (Addendum)
Physical Therapy Treatment ?Patient Details ?Name: Christian [name] ?MRN: [id number] ?DOB: Feb 21, 1951 ?Today's Date: 04/06/2022 ? ? ?History of Present Illness 71 y.o. male presented 5/2 with worsening strength , new decubitus ulcers and poor intake.  Recent admission for cellulitis and hypoglycemia, had just returned to ALF from SNF a week ago but not OOB since then.  Has new hypoglyemia, elevation of K+, creatinine, CK.  Ulcer is not infected but has LE skin is also irritated and has skin breakdown.  PMHx:  chronic venous stasis and chronic right shin unhealing wound, chronic HFpEF, IDDM, CAD with stenting x2 in 2021, PVD, CKD stage IIIa, HTN, DVT on Xarelto, bipolar disorder, OSA on CPAP at bedtime ? ?  ?PT Comments  ? ? Pt is making good progress towards his goals, however continues to be limited in safe mobility by pain from sacral wound and fear of falling. Pt able to come to EoB with maxAx2 where he was able to sit for approximately 12 minutes 3 of which were without outside support. While sitting EoB able to perform seated exercises and sing gospel music. PT requested RN order air mattress due to pt sacral pressure injury. D/c plan remains appropriate at this time. PT will continue to progress mobility acutely.  ?   ?Recommendations for follow up therapy are one component of a multi-disciplinary discharge planning process, led by the attending physician.  Recommendations may be updated based on patient status, additional functional criteria and insurance authorization. ? ?Follow Up Recommendations ? Skilled nursing-short term rehab (<3 hours/day) ?  ?  ?Assistance Recommended at Discharge Frequent or constant Supervision/Assistance  ?Patient can return home with the following Two people to help with walking and/or transfers;Assistance with cooking/housework;Assist for transportation;Help with stairs or ramp for entrance;Two people to help with bathing/dressing/bathroom;Assistance with feeding;Direct  supervision/assist for medications management;Direct supervision/assist for financial management ?  ?Equipment Recommendations ? None recommended by PT  ?  ?Recommendations for Other Services   ? ? ?  ?Precautions / Restrictions Precautions ?Precautions: Fall ?Precaution Comments: monitor vitals, HR and BP esp ?Restrictions ?Weight Bearing Restrictions: No  ?  ? ?Mobility ? Bed Mobility ?Overal bed mobility: Needs Assistance ?Bed Mobility: Supine to Sit, Sit to Supine ?  ?  ?Supine to sit: Max assist, HOB elevated, +2 for physical assistance ?Sit to supine: Total assist, +2 for physical assistance ?  ?General bed mobility comments: pt able to follow commands to progress LE towards EoB, and to reach to bedrail to pull up, pt with increased tremulousness with coming to EoB, stating he is dizzy and that he is afraid of falling, reports falling 8 times just prior to hospitalization hurting his R knee, after seated exercise pt requires total A for trunk to sidelying and LE back into bed ?  ? ?Transfers ?  ?  ?  ?  ?  ?  ?  ?  ?  ?General transfer comment: increased fear of falling hinders attempt to come to standing ?  ? ? ? ?  ?Balance Overall balance assessment: Needs assistance, History of Falls ?Sitting-balance support: Feet supported ?Sitting balance-Leahy Scale: Poor ?Sitting balance - Comments: able to progress to 3 min bout of sitting without outside assisst and bilateral UE support ?  ?  ?  ?  ?  ?  ?  ?  ?  ?  ?  ?  ?  ?  ?  ?  ? ?  ?Cognition Arousal/Alertness: Awake/alert ?Behavior During Therapy: Flat affect ?Overall Cognitive  Status: No family/caregiver present to determine baseline cognitive functioning ?  ?  ?  ?  ?  ?  ?  ?  ?  ?  ?  ?  ?  ?  ?  ?  ?General Comments: continues to have slowed processing, however able to follow one step commands with increased time ?  ?  ? ?  ?Exercises General Exercises - Lower Extremity ?Long Arc Quad: AROM, Both, 10 reps, Seated ?Hip Flexion/Marching: AROM, Both, 10  reps, Seated ?Toe Raises: AROM, Both, 10 reps, Seated ? ?  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  ?  ? ?Pertinent Vitals/Pain Pain Assessment ?Pain Assessment: Faces ?Faces Pain Scale: Hurts even more ?Pain Location: sacrum with sitting up ?Pain Descriptors / Indicators: Grimacing, Guarding ?Pain Intervention(s): Limited activity within patient's tolerance  ? ? ? ?PT Goals (current goals can now be found in the care plan section) Acute Rehab PT Goals ?Patient Stated Goal: to move and go home ?PT Goal Formulation: With patient ?Time For Goal Achievement: 04/14/22 ?Potential to Achieve Goals: Fair ?Progress towards PT goals: Progressing toward goals ? ?  ?Frequency ? ? ? Min 2X/week ? ? ? ?  ?PT Plan Current plan remains appropriate  ? ? ?   ?AM-PAC PT "6 Clicks" Mobility   ?Outcome Measure ? Help needed turning from your back to your side while in a flat bed without using bedrails?: Total ?Help needed moving from lying on your back to sitting on the side of a flat bed without using bedrails?: Total ?Help needed moving to and from a bed to a chair (including a wheelchair)?: Total ?Help needed standing up from a chair using your arms (e.g., wheelchair or bedside chair)?: Total ?Help needed to walk in hospital room?: Total ?Help needed climbing 3-5 steps with a railing? : Total ?6 Click Score: 6 ? ?  ?End of Session   ?Activity Tolerance: Patient tolerated treatment well;Patient limited by pain ?Patient left: in bed;with call bell/phone within reach;with bed alarm set ?Nurse Communication: Mobility status ?PT Visit Diagnosis: Unsteadiness on feet (R26.81);History of falling (Z91.81);Repeated falls (R29.6);Difficulty in walking, not elsewhere classified (R26.2) ?  ? ? ?Time: [id number] ?PT Time Calculation  (min) (ACUTE ONLY): 27 min ? ?Charges:  $Therapeutic Exercise: 8-22 mins ?$Therapeutic Activity: 8-22 mins          ?          ? ?Quinterious Walraven B. Migdalia Dk PT, DPT ?Acute Rehabilitation Services ?Please use secure chat or  ?Call Office [phone] ? ? ? ?[place] ?04/06/2022, 1:00 PM ? ?

## 2022-04-06 NOTE — TOC Progression Note (Signed)
Transition of Care (TOC) - Progression Note  ? ? ?Patient Details  ?Name: Christian [name] ?MRN: [id number] ?Date of Birth: 1951/04/21 ? ?Transition of Care (TOC) CM/SW Contact  ?Mearl Latin, LCSW ?Phone Number: ?04/06/2022, 4:37 PM ? ?Clinical Narrative:    ?CSW contacted patient's daughter to see what she had decided regarding SNF placement. Voicemail full.  ? ?CSW discussed case with [facility] and they stated if [facility] approves rehab and if family applies for long term care Medicaid they will not have to pay the copays up front and there is potential to not have to sign over patient's entire check for the first 6 months if they ask DSS for protection of his home.  ? ? ?Expected Discharge Plan: Skilled Nursing Facility ?Barriers to Discharge: Continued Medical Work up, English as a second language teacher, SNF Pending bed offer ? ?Expected Discharge Plan and Services ?Expected Discharge Plan: Skilled Nursing Facility ?In-house Referral: Clinical Social Work ?  ?Post Acute Care Choice: Skilled Nursing Facility ?Living arrangements for the past 2 months: Single Family Home, Skilled Nursing Facility ?                ?  ?  ?  ?  ?  ?  ?  ?  ?  ?  ? ? ?Social Determinants of Health (SDOH) Interventions ?  ? ?Readmission Risk Interventions ? ?  01/28/2022  ? 10:17 AM  ?Readmission Risk Prevention Plan  ?Transportation Screening Complete  ?PCP or Specialist Appt within 5-7 Days Complete  ?Home Care Screening Complete  ?Medication Review (RN CM) Complete  ? ? ?

## 2022-04-06 NOTE — Progress Notes (Signed)
?   04/06/22 1246  ?Assess: MEWS Score  ?Temp 98.1 ?F (36.7 ?C)  ?BP 117/79  ?Pulse Rate (!) 110  ?ECG Heart Rate (!) 111  ?Resp 16  ?Level of Consciousness Alert  ?SpO2 100 %  ?O2 Device Room Air  ?Assess: MEWS Score  ?MEWS Temp 0  ?MEWS Systolic 0  ?MEWS Pulse 2  ?MEWS RR 0  ?MEWS LOC 0  ?MEWS Score 2  ?MEWS Score Color Yellow  ?Assess: if the MEWS score is Yellow or Red  ?Were vital signs taken at a resting state? Yes  ?Focused Assessment No change from prior assessment  ?Early Detection of Sepsis Score *See Row Information* Medium  ?MEWS guidelines implemented *See Row Information* Yes  ?Treat  ?MEWS Interventions Escalated (See documentation below)  ?Pain Scale 0-10  ?Pain Score 4  ?Take Vital Signs  ?Increase Vital Sign Frequency  Yellow: Q 2hr X 2 then Q 4hr X 2, if remains yellow, continue Q 4hrs  ?Escalate  ?MEWS: Escalate Yellow: discuss with charge nurse/RN and consider discussing with provider and RRT  ?Notify: Charge Nurse/RN  ?Name of Charge Nurse/RN Notified Erin RN  ?Date Charge Nurse/RN Notified 04/06/22  ?Time Charge Nurse/RN Notified 1300  ?Notify: Provider  ?Provider Name/Title Dr. Randol Kern  ?Date Provider Notified 04/06/22  ?Time Provider Notified 1300  ?Method of Notification Page  ?Notification Reason Other (Comment) ?(yellow mews)  ?Provider response In department;No new orders  ?Date of Provider Response 04/06/22  ?Time of Provider Response 1305  ? ? ?

## 2022-04-07 DIAGNOSIS — R748 Abnormal levels of other serum enzymes: Secondary | ICD-10-CM | POA: Diagnosis not present

## 2022-04-07 DIAGNOSIS — E875 Hyperkalemia: Secondary | ICD-10-CM | POA: Diagnosis not present

## 2022-04-07 DIAGNOSIS — E119 Type 2 diabetes mellitus without complications: Secondary | ICD-10-CM | POA: Diagnosis not present

## 2022-04-07 DIAGNOSIS — E162 Hypoglycemia, unspecified: Secondary | ICD-10-CM | POA: Diagnosis not present

## 2022-04-07 LAB — GLUCOSE, CAPILLARY
Glucose-Capillary: 199 mg/dL — ABNORMAL HIGH (ref 70–99)
Glucose-Capillary: 292 mg/dL — ABNORMAL HIGH (ref 70–99)
Glucose-Capillary: 271 mg/dL — ABNORMAL HIGH (ref 70–99)
Glucose-Capillary: 330 mg/dL — ABNORMAL HIGH (ref 70–99)

## 2022-04-07 LAB — BASIC METABOLIC PANEL
Anion gap: 6 (ref 5–15)
BUN: 29 mg/dL — ABNORMAL HIGH (ref 8–23)
CO2: 26 mmol/L (ref 22–32)
Calcium: 9.5 mg/dL (ref 8.9–10.3)
Chloride: 101 mmol/L (ref 98–111)
Creatinine, Ser: 1.44 mg/dL — ABNORMAL HIGH (ref 0.61–1.24)
GFR, Estimated: 53 mL/min — ABNORMAL LOW (ref >60)
Glucose, Bld: 174 mg/dL — ABNORMAL HIGH (ref 70–99)
Potassium: 5.3 mmol/L — ABNORMAL HIGH (ref 3.5–5.1)
Sodium: 133 mmol/L — ABNORMAL LOW (ref 135–145)

## 2022-04-07 MED ORDER — TORSEMIDE 20 MG PO TABS
20.0000 mg | ORAL_TABLET | Freq: Every day | ORAL | Status: DC
  Administered 2022-04-07 – 2022-04-09 (×3): 20 mg via ORAL
  Filled 2022-04-07 (×3): qty 1

## 2022-04-07 NOTE — TOC Progression Note (Addendum)
Transition of Care (TOC) - Progression Note  ? ? ?Patient Details  ?Name: Christian [name] ?MRN: [id number] ?Date of Birth: Feb 18, 1951 ? ?Transition of Care (TOC) CM/SW Contact  ?Mearl Latin, LCSW ?Phone Number: ?04/07/2022, 9:28 AM ? ?Clinical Narrative:    ?9:28am-CSW attempted to call patient's daughter again; no answer and voicemail is full.  ? ?9:40am-CSW received call back from daughter. CSW requested [facility] liaison call daughter to answer further questions regarding the Medicaid process as patient has a payee that pays his rent and bills.  ? ? ?Expected Discharge Plan: Skilled Nursing Facility ?Barriers to Discharge: Continued Medical Work up, English as a second language teacher, SNF Pending bed offer ? ?Expected Discharge Plan and Services ?Expected Discharge Plan: Skilled Nursing Facility ?In-house Referral: Clinical Social Work ?  ?Post Acute Care Choice: Skilled Nursing Facility ?Living arrangements for the past 2 months: Single Family Home, Skilled Nursing Facility ?                ?  ?  ?  ?  ?  ?  ?  ?  ?  ?  ? ? ?Social Determinants of Health (SDOH) Interventions ?  ? ?Readmission Risk Interventions ? ?  01/28/2022  ? 10:17 AM  ?Readmission Risk Prevention Plan  ?Transportation Screening Complete  ?PCP or Specialist Appt within 5-7 Days Complete  ?Home Care Screening Complete  ?Medication Review (RN CM) Complete  ? ? ?

## 2022-04-07 NOTE — Progress Notes (Signed)
?      ?                 PROGRESS NOTE ? ?      ?PATIENT DETAILS ?Name: Abdihakim Thorsen Moede ?Age: 71 y.o. ?Sex: male ?Date of Birth: 09/19/1951 ?Admit Date: 03/30/2022 ?Admitting Physician Rise Patience, MD ?PCP:Sun, Gari Crown, MD ? ?Brief Summary: ?Patient is a 71 y.o.  male with a history of A-fib on Xarelto, HFpEF, PAD, DM-2, bipolar disorder-presented with generalized weakness/sacral decubitus ulcer-was found to have hypoglycemia, hyperkalemia and subsequently admitted to the hospitalist service-see below for further details. ? ? ?Significant events: ?5/2>> admit to [facility] ? ?Significant studies: ?5/3>> x-ray pelvis: No fracture. ?5/3>> CT head: No acute intracranial findings. ?5/3>> x-ray left shoulder: Rotator cuff impingement ? ?Significant microbiology data: ? ? ?Procedures: ? ? ?Consults: ?Neurology, psychiatry ? ?Subjective: ?Lying comfortably in bed-denies any chest pain or shortness of breath. ? ?Objective: ?Vitals: ?Blood pressure 117/75, pulse 100, temperature 97.9 ?F (36.6 ?C), temperature source Oral, resp. rate 20, weight 109.6 kg, SpO2 97 %.  ? ?Exam: ?Gen Exam:Alert awake-not in any distress ?HEENT:atraumatic, normocephalic ?Chest: B/L clear to auscultation anteriorly ?CVS:S1S2 regular ?Abdomen:soft non tender, non distended ?Extremities:no edema ?Neurology: Non focal ?Skin: no rash ? ?Pertinent Labs/Radiology: ? ?  Latest Ref Rng & Units 04/06/2022  ?  4:13 AM 04/02/2022  ?  2:38 AM 04/01/2022  ?  7:58 AM  ?CBC  ?WBC 4.0 - 10.5 K/uL 5.8   6.2   5.7    ?Hemoglobin 13.0 - 17.0 g/dL 13.1   13.2   13.2    ?Hematocrit 39.0 - 52.0 % 42.1   41.9   42.5    ?Platelets 150 - 400 K/uL 222   235   239    ?  ?Lab Results  ?Component Value Date  ? NA 133 (L) 04/07/2022  ? K 5.3 (H) 04/07/2022  ? CL 101 04/07/2022  ? CO2 26 04/07/2022  ?  ? ? 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  Managed with supportive care.  After  neurology/psych evaluation-has been resumed on Abilify and Cogentin-Wellbutrin/Seroquel has been discontinued. ? ?AKI on CKD stage IIIa: AKI likely hemodynamically mediated-improved. ? ?Mild rhabdomyolysis: Nontraumatic-improved with supportive care. ? ?Hyperkalemia: Mild-continue Lokelma-resuming Demadex today. ? ?Hypoglycemia: Resolved with supportive care.  Do not resume Amaryl on discharge. ? ?DM-2 (A1c 7.6 on 4/15): CBG stable with SSI-allow permissive hyperglycemia-not a good candidate for tight glycemic control. ? ?Hypothyroidism: Continue Cytomel. ? ?PAF: Rate controlled-continue Xarelto ? ?PAD: Follow-up with vascular surgery in the outpatient setting ? ?HFpEF: Volume status stable.  Resume Demadex. ? ?Bipolar disorder: Given concerns for EPS-on Abilify and Cogentin-Depakote/Wellbutrin and Seroquel held. ? ?Left shoulder pain: Supportive care-likely rotator cuff injury.  Stable to be followed up by orthopedics in the outpatient setting ? ?OSA: CPAP nightly ? ?Debility/deconditioning: Plans of SNF on discharge. ? ?Nutrition Status: ?Nutrition Problem: Increased nutrient needs ?Etiology: chronic illness, wound healing ?Signs/Symptoms: estimated needs ?Interventions: Ensure Enlive (each supplement provides 350kcal and 20 grams of protein), MVI ? ?Pressure Ulcer: ?Pressure Injury 03/31/22 Sacrum Mid Stage 3 -  Full thickness tissue loss. Subcutaneous fat may be visible but bone, tendon or muscle are NOT exposed. (Active)  ?03/31/22 1933  ?Location: Sacrum  ?Location Orientation: Mid  ?Staging: Stage 3 -  Full thickness tissue loss. Subcutaneous fat may be visible but bone, tendon or muscle are NOT exposed.  ?Wound Description (Comments):   ?  Present on Admission: Yes  ?Dressing Type Foam - Lift dressing to assess site every shift 04/07/22 0757  ? ?Obesity: ?Estimated body mass index is 32.77 kg/m? as calculated from the following: ?  Height as of 03/12/22: 6' (1.829 m). ?  Weight as of this encounter: 109.6 kg.   ? ?Code status: ?  Code Status: Full Code  ? ?DVT Prophylaxis: ?rivaroxaban (XARELTO) tablet 20 mg   ? ?Family Communication: None at bedside ? ? ?Disposition Plan: ?Status is: Inpatient ?Remains inpatient appropriate because: Awaiting SNF bed. ?  ?Planned Discharge Destination:Skilled nursing facility ? ? ?Diet: ?Diet Order   ? ?       ?  Diet heart healthy/carb modified Room service appropriate? No; Fluid consistency: Thin  Diet effective now       ?  ? ?  ?  ? ?  ?  ? ? ?Antimicrobial agents: ?Anti-infectives (From admission, onward)  ? ? None  ? ?  ? ? ? ?MEDICATIONS: ?Scheduled Meds: ? ARIPiprazole  10 mg Oral q morning  ? benztropine  1 mg Oral BID  ? bisacodyl  10 mg Oral Once  ? feeding supplement  237 mL Oral BID BM  ? insulin aspart  0-9 Units Subcutaneous TID WC  ? liothyronine  10 mcg Oral Daily  ? multivitamin with minerals  1 tablet Oral Daily  ? polyethylene glycol  34 g Oral BID  ? rivaroxaban  20 mg Oral Q supper  ? senna-docusate  2 tablet Oral BID  ? sodium zirconium cyclosilicate  10 g Oral BID  ? ?Continuous Infusions: ?PRN Meds:.acetaminophen **OR** acetaminophen, bisacodyl, HYDROcodone-acetaminophen, melatonin ? ? ?I have personally reviewed following labs and imaging studies ? ?LABORATORY DATA: ?CBC: ?Recent Labs  ?Lab 04/01/22 ?0758 04/02/22 ?0238 04/06/22 ?0413  ?WBC 5.7 6.2 5.8  ?HGB 13.2 13.2 13.1  ?HCT 42.5 41.9 42.1  ?MCV 83.7 83.0 84.4  ?PLT 239 235 222  ? ? ?Basic Metabolic Panel: ?Recent Labs  ?Lab 04/02/22 ?0238 04/03/22 ?0126 04/04/22 ?MR:[id number] 04/06/22 ?0413 04/07/22 ?[id number]  ?NA 133* 132* 134* 132* 133*  ?K 5.0 5.3* 5.3* 5.5* 5.3*  ?CL 101 102 100 101 101  ?CO2 25 25 26 25 26   ?GLUCOSE 135* 175* 140* 215* 174*  ?BUN 34* 32* 29* 31* 29*  ?CREATININE 1.66* 1.75* 1.47* 1.37* 1.44*  ?CALCIUM 9.1 9.2 9.4 9.3 9.5  ? ? ?GFR: ?Estimated Creatinine Clearance: 62.8 mL/min (A) (by C-G formula based on SCr of 1.44 mg/dL (H)). ? ?Liver Function Tests: ?No results for input(s): AST, ALT, ALKPHOS,  BILITOT, PROT, ALBUMIN in the last 168 hours. ?No results for input(s): LIPASE, AMYLASE in the last 168 hours. ?No results for input(s): AMMONIA in the last 168 hours. ? ?Coagulation Profile: ?No results for input(s): INR, PROTIME in the last 168 hours. ? ?Cardiac Enzymes: ?No results for input(s): CKTOTAL, CKMB, CKMBINDEX, TROPONINI in the last 168 hours. ? ?BNP (last 3 results) ?No results for input(s): PROBNP in the last 8760 hours. ? ?Lipid Profile: ?No results for input(s): CHOL, HDL, LDLCALC, TRIG, CHOLHDL, LDLDIRECT in the last 72 hours. ? ?Thyroid Function Tests: ?No results for input(s): TSH, T4TOTAL, FREET4, T3FREE, THYROIDAB in the last 72 hours. ? ?Anemia Panel: ?No results for input(s): VITAMINB12, FOLATE, FERRITIN, TIBC, IRON, RETICCTPCT in the last 72 hours. ? ?Urine analysis: ?   ?Component Value Date/Time  ? COLORURINE YELLOW 03/17/2022 1538  ? APPEARANCEUR CLEAR 03/17/2022 1538  ? APPEARANCEUR Cloudy 08/01/2014 1614  ? LABSPEC 1.016 03/17/2022 1538  ?  LABSPEC 1.008 08/01/2014 1614  ? PHURINE 5.0 03/17/2022 1538  ? GLUCOSEU >=500 (A) 03/17/2022 1538  ? GLUCOSEU Negative 08/01/2014 1614  ? HGBUR NEGATIVE 03/17/2022 1538  ? [place] NEGATIVE 03/17/2022 1538  ? BILIRUBINUR Negative 08/01/2014 1614  ? [place] NEGATIVE 03/17/2022 1538  ? PROTEINUR NEGATIVE 03/17/2022 1538  ? UROBILINOGEN 0.2 07/01/2014 1809  ? NITRITE NEGATIVE 03/17/2022 1538  ? LEUKOCYTESUR NEGATIVE 03/17/2022 1538  ? LEUKOCYTESUR Negative 08/01/2014 1614  ? ? ?Sepsis Labs: ?Lactic Acid, Venous ?   ?Component Value Date/Time  ? LATICACIDVEN 1.7 03/11/2022 2247  ? ? ?MICROBIOLOGY: ?No results found for this or any previous visit (from the past 240 hour(s)). ? ?RADIOLOGY STUDIES/RESULTS: ?No results found. ? ? LOS: 7 days  ? ?Oren Binet, MD  ?[facility] ? ? ? ?To contact the attending provider between 7A-7P or the covering provider during after hours 7P-7A, please log into the web site [email or web address] and access using  universal [place] password for that web site. If you do not have the password, please call the hospital operator. ? ?04/07/2022, 1:43 PM ? ? ? ?

## 2022-04-07 NOTE — Progress Notes (Signed)
Nutrition Follow-up ? ?DOCUMENTATION CODES:  ? ?Not applicable ? ?INTERVENTION:  ? ?Continue Multivitamin w/ minerals daily ?Continue Ensure Enlive po BID, each supplement provides 350 kcal and 20 grams of protein. ?Liberalize pt diet to regular to increase PO intake 2/2 to preferences.  ?Meal ordering with assist  ?Encourage good PO intake  ? ?NUTRITION DIAGNOSIS:  ? ?Increased nutrient needs related to chronic illness, wound healing as evidenced by estimated needs. - Ongoing ? ?GOAL:  ? ?Patient will meet greater than or equal to 90% of their needs - Ongoing ? ?MONITOR:  ? ?PO intake, Supplement acceptance, Labs, Weight trends, Skin ? ?REASON FOR ASSESSMENT:  ? ?Consult ?Assessment of nutrition requirement/status ? ?ASSESSMENT:  ? ?71 y.o. male presented to the ED with generalized weakness. Pt recently discharged from rehab after hospital admission due to hypoglycemia cellulitis. PMH includes CHF, CKD III, HTN, and T2DM. Pt admitted with hypoglycemia, hyperkalemia, and generalized weakness.  ? ?Pt reports that he has not been eating well due to not liking some of the foods. Discussed that we can liberalize pt diet to help find additional items that pt likes to eat. Reports that he likes to have someone come in to take meal order.  ?Per EMR, pt PO intake includes: ?5/06: Breakfast 50%, Dinner 100% ?5/09: Breakfast 100%, Lunch 75% ?5/10: Breakfast 50% ? ?Pt denies any nausea or vomiting.  ? ?Per EMR, pt has been accepting all Ensure. Pt endorses liking the Ensure and drinking them. Encourage pt to continue to drink.  ? ?Medications reviewed and include: Dulcolax, SSI 0-9 units TID, MVI, Miralax, Lokelma ?Labs reviewed: Sodium 133, BUN 29, Creatinine 1.44 ? ?Diet Order:   ?Diet Order   ? ?       ?  Diet heart healthy/carb modified Room service appropriate? No; Fluid consistency: Thin  Diet effective now       ?  ? ?  ?  ? ?  ? ?EDUCATION NEEDS:  ? ?No education needs have been identified at this time ? ?Skin:  Skin  Assessment: Skin Integrity Issues: ?Skin Integrity Issues:: Stage III ?Stage III: Sacrum ? ?Last BM:  No Documentation ? ?Height:  ?Ht Readings from Last 1 Encounters:  ?03/12/22 6' (1.829 m)  ? ?Weight:  ?Wt Readings from Last 1 Encounters:  ?04/02/22 109.6 kg  ? ?Ideal Body Weight:  80.9 kg ? ?BMI:  Body mass index is 32.77 kg/m?. ? ?Estimated Nutritional Needs:  ?Kcal:  2100-2300 ?Protein:  105-120 grams ?Fluid:  >/= 2.1 L ? ? ?Kirby Crigler RD, LDN ?Clinical Dietitian ?See AMiON for contact information.  ?

## 2022-04-08 DIAGNOSIS — E875 Hyperkalemia: Secondary | ICD-10-CM | POA: Diagnosis not present

## 2022-04-08 DIAGNOSIS — E162 Hypoglycemia, unspecified: Secondary | ICD-10-CM | POA: Diagnosis not present

## 2022-04-08 DIAGNOSIS — R748 Abnormal levels of other serum enzymes: Secondary | ICD-10-CM | POA: Diagnosis not present

## 2022-04-08 DIAGNOSIS — E119 Type 2 diabetes mellitus without complications: Secondary | ICD-10-CM | POA: Diagnosis not present

## 2022-04-08 LAB — BASIC METABOLIC PANEL
Anion gap: 9 (ref 5–15)
BUN: 33 mg/dL — ABNORMAL HIGH (ref 8–23)
CO2: 24 mmol/L (ref 22–32)
Calcium: 9.4 mg/dL (ref 8.9–10.3)
Chloride: 99 mmol/L (ref 98–111)
Creatinine, Ser: 1.51 mg/dL — ABNORMAL HIGH (ref 0.61–1.24)
GFR, Estimated: 50 mL/min — ABNORMAL LOW (ref >60)
Glucose, Bld: 257 mg/dL — ABNORMAL HIGH (ref 70–99)
Potassium: 4.8 mmol/L (ref 3.5–5.1)
Sodium: 132 mmol/L — ABNORMAL LOW (ref 135–145)

## 2022-04-08 LAB — GLUCOSE, CAPILLARY
Glucose-Capillary: 198 mg/dL — ABNORMAL HIGH (ref 70–99)
Glucose-Capillary: 314 mg/dL — ABNORMAL HIGH (ref 70–99)
Glucose-Capillary: 268 mg/dL — ABNORMAL HIGH (ref 70–99)
Glucose-Capillary: 315 mg/dL — ABNORMAL HIGH (ref 70–99)

## 2022-04-08 MED ORDER — INSULIN GLARGINE-YFGN 100 UNIT/ML ~~LOC~~ SOLN
8.0000 [IU] | Freq: Every day | SUBCUTANEOUS | Status: DC
  Administered 2022-04-08: 8 [IU] via SUBCUTANEOUS
  Filled 2022-04-08 (×2): qty 0.08

## 2022-04-08 NOTE — Progress Notes (Signed)
?      ?                 PROGRESS NOTE ? ?      ?PATIENT DETAILS ?Name: Christian [name] ?Age: 71 y.o. ?Sex: male ?Date of Birth: 1950-11-30 ?Admit Date: 03/30/2022 ?Admitting Physician Eduard Clos, MD ?PCP:Sun, Charise Carwin, MD ? ?Brief Summary: ?Patient is a 71 y.o.  male with a history of A-fib on Xarelto, HFpEF, PAD, DM-2, bipolar disorder-presented with generalized weakness/sacral decubitus ulcer-was found to have hypoglycemia, hyperkalemia and subsequently admitted to the hospitalist service-see below for further details. ? ?Significant events: ?5/2>> admit to [facility] ? ?Significant studies: ?5/3>> x-ray pelvis: No fracture. ?5/3>> CT head: No acute intracranial findings. ?5/3>> x-ray left shoulder: Rotator cuff impingement ? ?Significant microbiology data: ? ? ?Procedures: ? ? ?Consults: ?Neurology, psychiatry ? ?Subjective: ?Lying comfortably in bed-awake/alert-no major issues overnight.  Awaiting SNF placement. ? ?Objective: ?Vitals: ?Blood pressure 123/85, pulse (!) 104, temperature 98.4 ?F (36.9 ?C), temperature source Oral, resp. rate 20, weight 109.6 kg, SpO2 100 %.  ? ?Exam: ?Gen Exam:Alert awake-not in any distress ?HEENT:atraumatic, normocephalic ?Chest: B/L clear to auscultation anteriorly ?CVS:S1S2 regular ?Abdomen:soft non tender, non distended ?Extremities:no edema ?Neurology: Non focal ?Skin: no rash  ? ?Pertinent Labs/Radiology: ? ?  Latest Ref Rng & Units 04/06/2022  ?  4:13 AM 04/02/2022  ?  2:38 AM 04/01/2022  ?  7:58 AM  ?CBC  ?WBC 4.0 - 10.5 K/uL 5.8   6.2   5.7    ?Hemoglobin 13.0 - 17.0 g/dL 97.9   89.2   11.9    ?Hematocrit 39.0 - 52.0 % 42.1   41.9   42.5    ?Platelets 150 - 400 K/uL 222   235   239    ?  ?Lab Results  ?Component Value Date  ? NA 132 (L) 04/08/2022  ? K 4.8 04/08/2022  ? CL 99 04/08/2022  ? CO2 24 04/08/2022  ? ?  ? ? 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  Managed with supportive care.   After neurology/psych evaluation-has been resumed on Abilify and Cogentin-Wellbutrin/Seroquel has been discontinued. ? ?AKI on CKD stage IIIa: AKI likely hemodynamically mediated-improved. ? ?Mild rhabdomyolysis: Nontraumatic-improved with supportive care. ? ?Hyperkalemia: Mild-continue Lokelma//Demadex-continue to follow and optimize accordingly. ? ?Hypoglycemia: Resolved with supportive care.  Do not resume Amaryl on discharge. ? ?DM-2 (A1c 7.6 on 4/15): CBG slowly creeping up-start low-dose Semglee-follow and adjust.   ? ?Recent Labs  ?  04/07/22 ?2050 04/08/22 ?0735 04/08/22 ?1142  ?GLUCAP 330* 198* 314*  ?  ? ?Hypothyroidism: Continue Cytomel. ? ?PAF: Rate controlled-continue Xarelto ? ?PAD: Follow-up with vascular surgery in the outpatient setting ? ?HFpEF: Volume status stable.  Resume Demadex. ? ?Bipolar disorder: Given concerns for EPS-on Abilify and Cogentin-Depakote/Wellbutrin and Seroquel held. ? ?Left shoulder pain: Supportive care-likely rotator cuff injury.  Stable to be followed up by orthopedics in the outpatient setting ? ?OSA: CPAP nightly ? ?Debility/deconditioning: Plans are of SNF on discharge. ? ?Nutrition Status: ?Nutrition Problem: Increased nutrient needs ?Etiology: chronic illness, wound healing ?Signs/Symptoms: estimated needs ?Interventions: Ensure Enlive (each supplement provides 350kcal and 20 grams of protein), MVI, Liberalize Diet ? ?Pressure Ulcer: ?Pressure Injury 03/31/22 Sacrum Mid Stage 3 -  Full thickness tissue loss. Subcutaneous fat may be visible but bone, tendon or muscle are NOT exposed. (Active)  ?03/31/22 1933  ?Location: Sacrum  ?Location Orientation: Mid  ?Staging: Stage  3 -  Full thickness tissue loss. Subcutaneous fat may be visible but bone, tendon or muscle are NOT exposed.  ?Wound Description (Comments):   ?Present on Admission: Yes  ?Dressing Type Foam - Lift dressing to assess site every shift 04/07/22 2115  ? ?Obesity: ?Estimated body mass index is 32.77 kg/m?  as calculated from the following: ?  Height as of 03/12/22: 6' (1.829 m). ?  Weight as of this encounter: 109.6 kg.  ? ?Code status: ?  Code Status: Full Code  ? ?DVT Prophylaxis: ?rivaroxaban (XARELTO) tablet 20 mg   ? ?Family Communication: None at bedside ? ? ?Disposition Plan: ?Status is: Inpatient ?Remains inpatient appropriate because: Awaiting SNF bed. ?  ?Planned Discharge Destination:Skilled nursing facility ? ? ?Diet: ?Diet Order   ? ?       ?  Diet regular Room service appropriate? Yes with Assist; Fluid consistency: Thin  Diet effective now       ?  ? ?  ?  ? ?  ?  ? ? ?Antimicrobial agents: ?Anti-infectives (From admission, onward)  ? ? None  ? ?  ? ? ? ?MEDICATIONS: ?Scheduled Meds: ? ARIPiprazole  10 mg Oral q morning  ? benztropine  1 mg Oral BID  ? feeding supplement  237 mL Oral BID BM  ? insulin aspart  0-9 Units Subcutaneous TID WC  ? liothyronine  10 mcg Oral Daily  ? multivitamin with minerals  1 tablet Oral Daily  ? polyethylene glycol  34 g Oral BID  ? rivaroxaban  20 mg Oral Q supper  ? senna-docusate  2 tablet Oral BID  ? sodium zirconium cyclosilicate  10 g Oral BID  ? torsemide  20 mg Oral Daily  ? ?Continuous Infusions: ?PRN Meds:.acetaminophen **OR** acetaminophen, bisacodyl, HYDROcodone-acetaminophen, melatonin ? ? ?I have personally reviewed following labs and imaging studies ? ?LABORATORY DATA: ?CBC: ?Recent Labs  ?Lab 04/02/22 ?0238 04/06/22 ?0413  ?WBC 6.2 5.8  ?HGB 13.2 13.1  ?HCT 41.9 42.1  ?MCV 83.0 84.4  ?PLT 235 222  ? ? ? ?Basic Metabolic Panel: ?Recent Labs  ?Lab 04/03/22 ?0126 04/04/22 ?0108 04/06/22 ?0413 04/07/22 ?MR:[id number] 04/08/22 ?0112  ?NA 132* 134* 132* 133* 132*  ?K 5.3* 5.3* 5.5* 5.3* 4.8  ?CL 102 100 101 101 99  ?CO2 25 26 25 26 24   ?GLUCOSE 175* 140* 215* 174* 257*  ?BUN 32* 29* 31* 29* 33*  ?CREATININE 1.75* 1.47* 1.37* 1.44* 1.51*  ?CALCIUM 9.2 9.4 9.3 9.5 9.4  ? ? ? ?GFR: ?Estimated Creatinine Clearance: 59.9 mL/min (A) (by C-G formula based on SCr of 1.51 mg/dL  (H)). ? ?Liver Function Tests: ?No results for input(s): AST, ALT, ALKPHOS, BILITOT, PROT, ALBUMIN in the last 168 hours. ?No results for input(s): LIPASE, AMYLASE in the last 168 hours. ?No results for input(s): AMMONIA in the last 168 hours. ? ?Coagulation Profile: ?No results for input(s): INR, PROTIME in the last 168 hours. ? ?Cardiac Enzymes: ?No results for input(s): CKTOTAL, CKMB, CKMBINDEX, TROPONINI in the last 168 hours. ? ?BNP (last 3 results) ?No results for input(s): PROBNP in the last 8760 hours. ? ?Lipid Profile: ?No results for input(s): CHOL, HDL, LDLCALC, TRIG, CHOLHDL, LDLDIRECT in the last 72 hours. ? ?Thyroid Function Tests: ?No results for input(s): TSH, T4TOTAL, FREET4, T3FREE, THYROIDAB in the last 72 hours. ? ?Anemia Panel: ?No results for input(s): VITAMINB12, FOLATE, FERRITIN, TIBC, IRON, RETICCTPCT in the last 72 hours. ? ?Urine analysis: ?   ?Component Value Date/Time  ? COLORURINE  YELLOW 03/17/2022 1538  ? APPEARANCEUR CLEAR 03/17/2022 1538  ? APPEARANCEUR Cloudy 08/01/2014 1614  ? LABSPEC 1.016 03/17/2022 1538  ? LABSPEC 1.008 08/01/2014 1614  ? PHURINE 5.0 03/17/2022 1538  ? GLUCOSEU >=500 (A) 03/17/2022 1538  ? GLUCOSEU Negative 08/01/2014 1614  ? HGBUR NEGATIVE 03/17/2022 1538  ? [place] NEGATIVE 03/17/2022 1538  ? BILIRUBINUR Negative 08/01/2014 1614  ? [place] NEGATIVE 03/17/2022 1538  ? PROTEINUR NEGATIVE 03/17/2022 1538  ? UROBILINOGEN 0.2 07/01/2014 1809  ? NITRITE NEGATIVE 03/17/2022 1538  ? LEUKOCYTESUR NEGATIVE 03/17/2022 1538  ? LEUKOCYTESUR Negative 08/01/2014 1614  ? ? ?Sepsis Labs: ?Lactic Acid, Venous ?   ?Component Value Date/Time  ? LATICACIDVEN 1.7 03/11/2022 2247  ? ? ?MICROBIOLOGY: ?No results found for this or any previous visit (from the past 240 hour(s)). ? ?RADIOLOGY STUDIES/RESULTS: ?No results found. ? ? LOS: 8 days  ? ?Oren Binet, MD  ?[facility] ? ? ? ?To contact the attending provider between 7A-7P or the covering provider during after  hours 7P-7A, please log into the web site [email or web address] and access using universal [place] password for that web site. If you do not have the password, please call the hospital operator. ? ?04/08/2022,

## 2022-04-08 NOTE — Progress Notes (Signed)
Patient refused CPAP. Has home unit set up at bedside. ?

## 2022-04-08 NOTE — TOC Progression Note (Addendum)
Transition of Care (TOC) - Progression Note  ? ? ?Patient Details  ?Name: Christian [name] ?MRN: [id number] ?Date of Birth: [birth year] ? ?Transition of Care (TOC) CM/SW Contact  ?Emilyann Banka Aris Lot, LCSW ?Phone Number: ?04/08/2022, 9:21 AM ? ?Clinical Narrative:    ? ?CSW contacted [employer] liaison to inquire about financial communications with pt daughter. CSW is informed that [employer] office manager is to call pt daughter to further discuss financials.  ? ?1200: CSW is informed that [employer] did speak with pt daughter but still needs to speak with pt's payee which should happen today.  ? ?Expected Discharge Plan: Skilled Nursing Facility ?Barriers to Discharge: Continued Medical Work up, English as a second language teacher, SNF Pending bed offer ? ?Expected Discharge Plan and Services ?Expected Discharge Plan: Skilled Nursing Facility ?In-house Referral: Clinical Social Work ?  ?Post Acute Care Choice: Skilled Nursing Facility ?Living arrangements for the past 2 months: Single Family Home, Skilled Nursing Facility ?                ?  ?  ?  ?  ?  ?  ?  ?  ?  ?  ? ? ?Social Determinants of Health (SDOH) Interventions ?  ? ?Readmission Risk Interventions ? ?  01/28/2022  ? 10:17 AM  ?Readmission Risk Prevention Plan  ?Transportation Screening Complete  ?PCP or Specialist Appt within 5-7 Days Complete  ?Home Care Screening Complete  ?Medication Review (RN CM) Complete  ? ? ?

## 2022-04-08 NOTE — Progress Notes (Signed)
RT NOTE: ? ?Pt has home CPAP unit @ bedside that he manages.  ?

## 2022-04-08 NOTE — Progress Notes (Signed)
Physical Therapy Treatment ?Patient Details ?Name: Christian [name] ?MRN: [id number] ?DOB: [birth year] ?Today's Date: 04/08/2022 ? ? ?History of Present Illness 71 y.o. male presented 5/2 with worsening strength , new decubitus ulcers and poor intake.  Recent admission for cellulitis and hypoglycemia, had just returned to ALF from SNF a week ago but not OOB since then.  Has new hypoglyemia, elevation of K+, creatinine, CK.  Ulcer is not infected but has LE skin is also irritated and has skin breakdown.  PMHx:  chronic venous stasis and chronic right shin unhealing wound, chronic HFpEF, IDDM, CAD with stenting x2 in 2021, PVD, CKD stage IIIa, HTN, DVT on Xarelto, bipolar disorder, OSA on CPAP at bedtime ? ?  ?PT Comments  ? ? Patient continues to be limited by fear of falling, weakness, and sitting balance. Patient requiring max-totalA+2 for bed mobility and mod-maxA to maintain midline posture in sitting due to heavy L lateral lean in sitting. Worked on various hand positions and pushing up from L lateral but no improvement in posture this session. Attempted to stand with Stedy but required totalA+2 to clear buttocks from elevated bed surface. Patient reporting dizziness when returning to supine but did not report during session. Continue to recommend SNF for ongoing Physical Therapy.    ?   ?Recommendations for follow up therapy are one component of a multi-disciplinary discharge planning process, led by the attending physician.  Recommendations may be updated based on patient status, additional functional criteria and insurance authorization. ? ?Follow Up Recommendations ? Skilled nursing-short term rehab (<3 hours/day) ?  ?  ?Assistance Recommended at Discharge Frequent or constant Supervision/Assistance  ?Patient can return home with the following Two people to help with walking and/or transfers;Assistance with cooking/housework;Assist for transportation;Help with stairs or ramp for entrance;Two people to help with  bathing/dressing/bathroom;Assistance with feeding;Direct supervision/assist for medications management;Direct supervision/assist for financial management ?  ?Equipment Recommendations ? None recommended by PT  ?  ?Recommendations for Other Services   ? ? ?  ?Precautions / Restrictions Precautions ?Precautions: Fall ?Precaution Comments: monitor vitals, HR and BP esp ?Restrictions ?Weight Bearing Restrictions: No  ?  ? ?Mobility ? Bed Mobility ?Overal bed mobility: Needs Assistance ?Bed Mobility: Supine to Sit, Sit to Supine ?  ?  ?Supine to sit: Max assist, +2 for physical assistance, +2 for safety/equipment, HOB elevated ?Sit to supine: Total assist, +2 for physical assistance, +2 for safety/equipment ?  ?General bed mobility comments: able to follow commands to bring LEs over towards the EOB but ultimately required maxA+2 to sit EOB with patient resisting and tremulousness due to fear of falling. Max encouragement provided to calm patient's nerves but only successful for brief period of time. Patient required totalA+2 to return to supine. Patient reporting dizziness upon returning to supine ?  ? ?Transfers ?Overall transfer level: Needs assistance ?Equipment used: Ambulation equipment used ?Transfers: Sit to/from Stand ?Sit to Stand: Total assist, +2 physical assistance, +2 safety/equipment, From elevated surface ?  ?  ?  ?  ?  ?General transfer comment: totalA+2 to attempt standing x 2 with ability to clear buttocks from bed surface. Max cues to utilize arms to pull self up and pushing through legs but unsuccessful. Patient with heavy L lateral lean in sitting with/without Stedy in front of him. ?Transfer via Lift Equipment: Stedy ? ?Ambulation/Gait ?  ?  ?  ?  ?  ?  ?  ?  ? ? ?Stairs ?  ?  ?  ?  ?  ? ? ?  Wheelchair Mobility ?  ? ?Modified Rankin (Stroke Patients Only) ?  ? ? ?  ?Balance Overall balance assessment: Needs assistance, History of Falls ?Sitting-balance support: Feet supported, Bilateral upper  extremity supported ?Sitting balance-Leahy Scale: Poor ?Sitting balance - Comments: heavy L lateral lean requiring mod-maxA to maintain midline posture. Unable to sit without assistance this session ?Postural control: Left lateral lean ?  ?  ?  ?  ?  ?  ?  ?  ?  ?  ?  ?  ?  ?  ?  ? ?  ?Cognition Arousal/Alertness: Awake/alert ?Behavior During Therapy: Flat affect, Anxious ?Overall Cognitive Status: No family/caregiver present to determine baseline cognitive functioning ?  ?  ?  ?  ?  ?  ?  ?  ?  ?  ?  ?  ?  ?  ?  ?  ?General Comments: slow processing. Severe fear of falling causing patient to become resistive to assistance for mobility ?  ?  ? ?  ?Exercises General Exercises - Lower Extremity ?Hip Flexion/Marching: Both, 10 reps, Seated ?Other Exercises ?Other Exercises: worked on midline posture with various hand placement and pushing up from L lateral but unsuccessful in correcting L lateral lean posture. ? ?  ?General Comments General comments (skin integrity, edema, etc.): VSS on RA ?  ?  ? ?Pertinent Vitals/Pain Pain Assessment ?Pain Assessment: Faces ?Faces Pain Scale: Hurts even more ?Pain Location: sacrum ?Pain Descriptors / Indicators: Grimacing, Guarding ?Pain Intervention(s): Monitored during session, Repositioned  ? ? ?Home Living   ?  ?  ?  ?  ?  ?  ?  ?  ?  ?   ?  ?Prior Function    ?  ?  ?   ? ?PT Goals (current goals can now be found in the care plan section) Acute Rehab PT Goals ?Patient Stated Goal: to not fall ?PT Goal Formulation: With patient ?Time For Goal Achievement: 04/14/22 ?Potential to Achieve Goals: Fair ?Progress towards PT goals: Progressing toward goals ? ?  ?Frequency ? ? ? Min 2X/week ? ? ? ?  ?PT Plan Current plan remains appropriate  ? ? ?Co-evaluation   ?  ?  ?  ?  ? ?  ?AM-PAC PT "6 Clicks" Mobility   ?Outcome Measure ? Help needed turning from your back to your side while in a flat bed without using bedrails?: Total ?Help needed moving from lying on your back to sitting on the  side of a flat bed without using bedrails?: Total ?Help needed moving to and from a bed to a chair (including a wheelchair)?: Total ?Help needed standing up from a chair using your arms (e.g., wheelchair or bedside chair)?: Total ?Help needed to walk in hospital room?: Total ?Help needed climbing 3-5 steps with a railing? : Total ?6 Click Score: 6 ? ?  ?End of Session Equipment Utilized During Treatment: Gait belt ?Activity Tolerance: Patient limited by pain;Other (comment) (anxious behaviors) ?Patient left: in bed;with call bell/phone within reach;with bed alarm set ?Nurse Communication: Mobility status ?PT Visit Diagnosis: Unsteadiness on feet (R26.81);History of falling (Z91.81);Repeated falls (R29.6);Difficulty in walking, not elsewhere classified (R26.2) ?  ? ? ?Time: [id number] ?PT Time Calculation (min) (ACUTE ONLY): 29 min ? ?Charges:  $Therapeutic Activity: 8-22 mins ?$Neuromuscular Re-education: 8-22 mins          ?          ? ?Melanie Pellot A. Gilford Rile, PT, DPT ?Acute Rehabilitation Services ?Pager [phone] ?Office [phone] ? ? ? ?  Garlan Drewes A Armend Hochstatter ?04/08/2022, 5:11 PM ? ?

## 2022-04-08 NOTE — TOC Progression Note (Signed)
Transition of Care (TOC) - Progression Note  ? ? ?Patient Details  ?Name: Christian [name] ?MRN: [id number] ?Date of Birth: [birth year] ? ?Transition of Care (TOC) CM/SW Contact  ?Cozy Veale Aris Lot, LCSW ?Phone Number: ?04/08/2022, 4:49 PM ? ?Clinical Narrative:    ? ?CSW spoke with [facility]. BOM attempted to reach payee but payee is off this week. BOM left message with coverage staff. [employer] liaison explained that they will discuss with payee cost and provide a 6 month letter of intent to return home which would help pt maintain his housing. CSW inquired if auth could be started; liaison will start auth and if medicaid/financial situation is not solidified, auth could be resubmitted at a later date.  ? ? ?Expected Discharge Plan: Skilled Nursing Facility ?Barriers to Discharge: Continued Medical Work up, English as a second language teacher, SNF Pending bed offer ? ?Expected Discharge Plan and Services ?Expected Discharge Plan: Skilled Nursing Facility ?In-house Referral: Clinical Social Work ?  ?Post Acute Care Choice: Skilled Nursing Facility ?Living arrangements for the past 2 months: Single Family Home, Skilled Nursing Facility ?                ?  ?  ?  ?  ?  ?  ?  ?  ?  ?  ? ? ?Social Determinants of Health (SDOH) Interventions ?  ? ?Readmission Risk Interventions ? ?  01/28/2022  ? 10:17 AM  ?Readmission Risk Prevention Plan  ?Transportation Screening Complete  ?PCP or Specialist Appt within 5-7 Days Complete  ?Home Care Screening Complete  ?Medication Review (RN CM) Complete  ? ? ?

## 2022-04-08 NOTE — Progress Notes (Signed)
Inpatient Diabetes Program Recommendations ? ?AACE/ADA: New Consensus Statement on Inpatient Glycemic Control  ?Target Ranges:  Prepandial:   less than 140 mg/dL ?     Peak postprandial:   less than 180 mg/dL (1-2 hours) ?     Critically ill patients:  140 - 180 mg/dL  ? ? Latest Reference Range & Units 04/08/22 07:35  ?Glucose-Capillary 70 - 99 mg/dL 458 (H)  ? ? Latest Reference Range & Units 04/07/22 07:41 04/07/22 11:54 04/07/22 15:47 04/07/22 20:50  ?Glucose-Capillary 70 - 99 mg/dL 099 (H) 833 (H) 825 (H) 330 (H)  ? ?Review of Glycemic Control ? ?Diabetes history: DM2 ?Outpatient Diabetes medications: Jardiance 10 mg daily, Amaryl 2 mg QAM, Lantus 5 units daily ?Current orders for Inpatient glycemic control: Novolog 0-9 units TID with meals ?  ?Inpatient Diabetes Program Recommendations:   ?  ?Insulin: Please consider ordering Novolog 3 units TID with meals for meal coverage if patient eats at least 50% of meals. ?  ?Thanks, ?Orlando Penner, RN, MSN, CDE ?Diabetes Coordinator ?Inpatient Diabetes Program ?[phone] (Team Pager from 8am to 5pm) ? ?

## 2022-04-08 NOTE — Plan of Care (Signed)
  Problem: Education: Goal: Knowledge of General Education information will improve Description: Including pain rating scale, medication(s)/side effects and non-pharmacologic comfort measures Outcome: Progressing   Problem: Clinical Measurements: Goal: Ability to maintain clinical measurements within normal limits will improve Outcome: Progressing Goal: Will remain free from infection Outcome: Progressing Goal: Diagnostic test results will improve Outcome: Progressing Goal: Respiratory complications will improve Outcome: Progressing Goal: Cardiovascular complication will be avoided Outcome: Progressing   Problem: Activity: Goal: Risk for activity intolerance will decrease Outcome: Progressing   Problem: Nutrition: Goal: Adequate nutrition will be maintained Outcome: Progressing   Problem: Pain Managment: Goal: General experience of comfort will improve Outcome: Progressing   Problem: Safety: Goal: Ability to remain free from injury will improve Outcome: Progressing   Problem: Skin Integrity: Goal: Risk for impaired skin integrity will decrease Outcome: Progressing   

## 2022-04-08 NOTE — Plan of Care (Signed)
?  Problem: Education: ?Goal: Knowledge of General Education information will improve ?Description: Including pain rating scale, medication(s)/side effects and non-pharmacologic comfort measures ?Outcome: Progressing ?  ?Problem: Clinical Measurements: ?Goal: Ability to maintain clinical measurements within normal limits will improve ?Outcome: Progressing ?Goal: Will remain free from infection ?Outcome: Progressing ?Goal: Diagnostic test results will improve ?Outcome: Progressing ?Goal: Respiratory complications will improve ?Outcome: Progressing ?Goal: Cardiovascular complication will be avoided ?Outcome: Progressing ?  ?Problem: Activity: ?Goal: Risk for activity intolerance will decrease ?Outcome: Progressing ?  ?Problem: Pain Managment: ?Goal: General experience of comfort will improve ?Outcome: Progressing ?  ?Problem: Safety: ?Goal: Ability to remain free from injury will improve ?Outcome: Progressing ?  ?Problem: Skin Integrity: ?Goal: Risk for impaired skin integrity will decrease ?Outcome: Progressing ?  ?Problem: Nutrition: ?Goal: Adequate nutrition will be maintained ?Outcome: Progressing ?  ?

## 2022-04-09 DIAGNOSIS — E119 Type 2 diabetes mellitus without complications: Secondary | ICD-10-CM | POA: Diagnosis not present

## 2022-04-09 DIAGNOSIS — R748 Abnormal levels of other serum enzymes: Secondary | ICD-10-CM | POA: Diagnosis not present

## 2022-04-09 DIAGNOSIS — E875 Hyperkalemia: Secondary | ICD-10-CM | POA: Diagnosis not present

## 2022-04-09 DIAGNOSIS — E162 Hypoglycemia, unspecified: Secondary | ICD-10-CM | POA: Diagnosis not present

## 2022-04-09 LAB — BASIC METABOLIC PANEL
Anion gap: 6 (ref 5–15)
BUN: 34 mg/dL — ABNORMAL HIGH (ref 8–23)
CO2: 30 mmol/L (ref 22–32)
Calcium: 9.6 mg/dL (ref 8.9–10.3)
Chloride: 97 mmol/L — ABNORMAL LOW (ref 98–111)
Creatinine, Ser: 1.67 mg/dL — ABNORMAL HIGH (ref 0.61–1.24)
GFR, Estimated: 44 mL/min — ABNORMAL LOW (ref >60)
Glucose, Bld: 242 mg/dL — ABNORMAL HIGH (ref 70–99)
Potassium: 4.6 mmol/L (ref 3.5–5.1)
Sodium: 133 mmol/L — ABNORMAL LOW (ref 135–145)

## 2022-04-09 LAB — GLUCOSE, CAPILLARY
Glucose-Capillary: 234 mg/dL — ABNORMAL HIGH (ref 70–99)
Glucose-Capillary: 387 mg/dL — ABNORMAL HIGH (ref 70–99)
Glucose-Capillary: 260 mg/dL — ABNORMAL HIGH (ref 70–99)
Glucose-Capillary: 168 mg/dL — ABNORMAL HIGH (ref 70–99)

## 2022-04-09 MED ORDER — INSULIN GLARGINE-YFGN 100 UNIT/ML ~~LOC~~ SOLN
10.0000 [IU] | Freq: Every day | SUBCUTANEOUS | Status: DC
  Administered 2022-04-09 – 2022-04-10 (×2): 10 [IU] via SUBCUTANEOUS
  Filled 2022-04-09 (×3): qty 0.1

## 2022-04-09 MED ORDER — INSULIN ASPART 100 UNIT/ML IJ SOLN
4.0000 [IU] | Freq: Three times a day (TID) | INTRAMUSCULAR | Status: DC
  Administered 2022-04-09 – 2022-04-11 (×9): 4 [IU] via SUBCUTANEOUS

## 2022-04-09 NOTE — Progress Notes (Signed)
?      ?                 PROGRESS NOTE ? ?      ?PATIENT DETAILS ?Name: Christian [name] ?Age: 71 y.o. ?Sex: male ?Date of Birth: 1951-04-29 ?Admit Date: 03/30/2022 ?Admitting Physician Rise Patience, MD ?PCP:Sun, Gari Crown, MD ? ?Brief Summary: ?Patient is a 71 y.o.  male with a history of A-fib on Xarelto, HFpEF, PAD, DM-2, bipolar disorder-presented with generalized weakness/sacral decubitus ulcer-was found to have hypoglycemia, hyperkalemia and subsequently admitted to the hospitalist service-see below for further details. ? ?Significant events: ?5/2>> admit to [facility] ? ?Significant studies: ?5/3>> x-ray pelvis: No fracture. ?5/3>> CT head: No acute intracranial findings. ?5/3>> x-ray left shoulder: Rotator cuff impingement ? ?Significant microbiology data: ? ? ?Procedures: ? ? ?Consults: ?Neurology, psychiatry ? ?Subjective: ?Awake/alert-no major issues overnight-awaiting SNF bed.  Remains medically stable. ? ?Objective: ?Vitals: ?Blood pressure 114/82, pulse 97, temperature 98.1 ?F (36.7 ?C), temperature source Oral, resp. rate 20, weight 109.6 kg, SpO2 96 %.  ? ?Exam: ?Gen Exam:Alert awake-not in any distress ?HEENT:atraumatic, normocephalic ?Chest: B/L clear to auscultation anteriorly ?CVS:S1S2 regular ?Abdomen:soft non tender, non distended ?Extremities:no edema ?Neurology: Non focal ?Skin: no rash  ? ?Pertinent Labs/Radiology: ? ?  Latest Ref Rng & Units 04/06/2022  ?  4:13 AM 04/02/2022  ?  2:38 AM 04/01/2022  ?  7:58 AM  ?CBC  ?WBC 4.0 - 10.5 K/uL 5.8   6.2   5.7    ?Hemoglobin 13.0 - 17.0 g/dL 13.1   13.2   13.2    ?Hematocrit 39.0 - 52.0 % 42.1   41.9   42.5    ?Platelets 150 - 400 K/uL 222   235   239    ?  ?Lab Results  ?Component Value Date  ? NA 133 (L) 04/09/2022  ? K 4.6 04/09/2022  ? CL 97 (L) 04/09/2022  ? CO2 30 04/09/2022  ? ?  ? ? 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  Managed with supportive care.  After  neurology/psych evaluation-has been resumed on Abilify and Cogentin-Wellbutrin/Seroquel has been discontinued. ? ?AKI on CKD stage IIIa: AKI likely hemodynamically mediated-improved. ? ?Mild rhabdomyolysis: Nontraumatic-improved with supportive care. ? ?Hyperkalemia: Hold with Lokelma/Demadex-continue.   ? ?Hypoglycemia: Resolved with supportive care.  Do not resume Amaryl on discharge. ? ?DM-2 (A1c 7.6 on 4/15): CBG remains on the higher side-increase Semglee to 10 units-add 4 units of NovoLog with meals-continue SSI.   ? ?Recent Labs  ?  04/08/22 ?1713 04/08/22 ?2122 04/09/22 ?0752  ?GLUCAP 268* 315* 234*  ? ?  ? ?Hypothyroidism: Continue Cytomel. ? ?PAF: Rate controlled-continue Xarelto ? ?PAD: Follow-up with vascular surgery in the outpatient setting ? ?HFpEF: Volume status stable.  Continue Demadex ? ?Bipolar disorder: Given concerns for EPS-on Abilify and Cogentin-Depakote/Wellbutrin and Seroquel held. ? ?Left shoulder pain: Supportive care-likely rotator cuff injury.  Stable to be followed up by orthopedics in the outpatient setting ? ?OSA: CPAP nightly ? ?Debility/deconditioning: Plans are of SNF on discharge. ? ?Nutrition Status: ?Nutrition Problem: Increased nutrient needs ?Etiology: chronic illness, wound healing ?Signs/Symptoms: estimated needs ?Interventions: Ensure Enlive (each supplement provides 350kcal and 20 grams of protein), MVI, Liberalize Diet ? ?Pressure Ulcer: ?Pressure Injury 03/31/22 Sacrum Mid Stage 3 -  Full thickness tissue loss. Subcutaneous fat may be visible but bone, tendon or muscle are NOT exposed. (Active)  ?03/31/22 1933  ?Location: Sacrum  ?  Location Orientation: Mid  ?Staging: Stage 3 -  Full thickness tissue loss. Subcutaneous fat may be visible but bone, tendon or muscle are NOT exposed.  ?Wound Description (Comments):   ?Present on Admission: Yes  ?Dressing Type Foam - Lift dressing to assess site every shift;Impregnated gauze (petrolatum) 04/09/22 0100   ? ?Obesity: ?Estimated body mass index is 32.77 kg/m? as calculated from the following: ?  Height as of 03/12/22: 6' (1.829 m). ?  Weight as of this encounter: 109.6 kg.  ? ?Code status: ?  Code Status: Full Code  ? ?DVT Prophylaxis: ?rivaroxaban (XARELTO) tablet 20 mg   ? ?Family Communication: None at bedside ? ? ?Disposition Plan: ?Status is: Inpatient ?Remains inpatient appropriate because: Awaiting SNF bed. ?  ?Planned Discharge Destination:Skilled nursing facility ? ? ?Diet: ?Diet Order   ? ?       ?  DIET DYS 3 Room service appropriate? Yes; Fluid consistency: Thin  Diet effective now       ?  ? ?  ?  ? ?  ?  ? ? ?Antimicrobial agents: ?Anti-infectives (From admission, onward)  ? ? None  ? ?  ? ? ? ?MEDICATIONS: ?Scheduled Meds: ? ARIPiprazole  10 mg Oral q morning  ? benztropine  1 mg Oral BID  ? feeding supplement  237 mL Oral BID BM  ? insulin aspart  0-9 Units Subcutaneous TID WC  ? insulin aspart  4 Units Subcutaneous TID WC  ? insulin glargine-yfgn  10 Units Subcutaneous Daily  ? liothyronine  10 mcg Oral Daily  ? multivitamin with minerals  1 tablet Oral Daily  ? rivaroxaban  20 mg Oral Q supper  ? senna-docusate  2 tablet Oral BID  ? sodium zirconium cyclosilicate  10 g Oral BID  ? torsemide  20 mg Oral Daily  ? ?Continuous Infusions: ?PRN Meds:.acetaminophen **OR** acetaminophen, bisacodyl, HYDROcodone-acetaminophen, melatonin ? ? ?I have personally reviewed following labs and imaging studies ? ?LABORATORY DATA: ?CBC: ?Recent Labs  ?Lab 04/06/22 ?0413  ?WBC 5.8  ?HGB 13.1  ?HCT 42.1  ?MCV 84.4  ?PLT 222  ? ? ? ?Basic Metabolic Panel: ?Recent Labs  ?Lab 04/04/22 ?0108 04/06/22 ?0413 04/07/22 ?0108 04/08/22 ?0112 04/09/22 ?0309  ?NA 134* 132* 133* 132* 133*  ?K 5.3* 5.5* 5.3* 4.8 4.6  ?CL 100 101 101 99 97*  ?CO2 26 25 26 24 30   ?GLUCOSE 140* 215* 174* 257* 242*  ?BUN 29* 31* 29* 33* 34*  ?CREATININE 1.47* 1.37* 1.44* 1.51* 1.67*  ?CALCIUM 9.4 9.3 9.5 9.4 9.6  ? ? ? ?GFR: ?Estimated Creatinine  Clearance: 54.1 mL/min (A) (by C-G formula based on SCr of 1.67 mg/dL (H)). ? ?Liver Function Tests: ?No results for input(s): AST, ALT, ALKPHOS, BILITOT, PROT, ALBUMIN in the last 168 hours. ?No results for input(s): LIPASE, AMYLASE in the last 168 hours. ?No results for input(s): AMMONIA in the last 168 hours. ? ?Coagulation Profile: ?No results for input(s): INR, PROTIME in the last 168 hours. ? ?Cardiac Enzymes: ?No results for input(s): CKTOTAL, CKMB, CKMBINDEX, TROPONINI in the last 168 hours. ? ?BNP (last 3 results) ?No results for input(s): PROBNP in the last 8760 hours. ? ?Lipid Profile: ?No results for input(s): CHOL, HDL, LDLCALC, TRIG, CHOLHDL, LDLDIRECT in the last 72 hours. ? ?Thyroid Function Tests: ?No results for input(s): TSH, T4TOTAL, FREET4, T3FREE, THYROIDAB in the last 72 hours. ? ?Anemia Panel: ?No results for input(s): VITAMINB12, FOLATE, FERRITIN, TIBC, IRON, RETICCTPCT in the last 72 hours. ? ?Urine  analysis: ?   ?Component Value Date/Time  ? COLORURINE YELLOW 03/17/2022 1538  ? APPEARANCEUR CLEAR 03/17/2022 1538  ? APPEARANCEUR Cloudy 08/01/2014 1614  ? LABSPEC 1.016 03/17/2022 1538  ? LABSPEC 1.008 08/01/2014 1614  ? PHURINE 5.0 03/17/2022 1538  ? GLUCOSEU >=500 (A) 03/17/2022 1538  ? GLUCOSEU Negative 08/01/2014 1614  ? HGBUR NEGATIVE 03/17/2022 1538  ? [place] NEGATIVE 03/17/2022 1538  ? BILIRUBINUR Negative 08/01/2014 1614  ? [place] NEGATIVE 03/17/2022 1538  ? PROTEINUR NEGATIVE 03/17/2022 1538  ? UROBILINOGEN 0.2 07/01/2014 1809  ? NITRITE NEGATIVE 03/17/2022 1538  ? LEUKOCYTESUR NEGATIVE 03/17/2022 1538  ? LEUKOCYTESUR Negative 08/01/2014 1614  ? ? ?Sepsis Labs: ?Lactic Acid, Venous ?   ?Component Value Date/Time  ? LATICACIDVEN 1.7 03/11/2022 2247  ? ? ?MICROBIOLOGY: ?No results found for this or any previous visit (from the past 240 hour(s)). ? ?RADIOLOGY STUDIES/RESULTS: ?No results found. ? ? LOS: 9 days  ? ?Oren Binet, MD  ?[facility] ? ? ? ?To contact the  attending provider between 7A-7P or the covering provider during after hours 7P-7A, please log into the web site [email or web address] and access using universal [place] password for that web site. If you do not have the password, please c

## 2022-04-09 NOTE — TOC Progression Note (Signed)
Transition of Care (TOC) - Progression Note  ? ? ?Patient Details  ?Name: Christian [name] ?MRN: [id number] ?Date of Birth: 09/21/1951 ? ?Transition of Care (TOC) CM/SW Contact  ?Layne Dilauro Aris Lot, LCSW ?Phone Number: ?04/09/2022, 4:00 PM ? ?Clinical Narrative:    ? ?Contacted [facility] liaison to inquire about BOM speaking with pt's Payee regarding financials. Pt's Payee is still off for the week. BOM had left message with payee's coverage yesterday but has not gotten a response. BOM left message for them again today; no response.  ? ?Expected Discharge Plan: Skilled Nursing Facility ?Barriers to Discharge: Continued Medical Work up, English as a second language teacher, SNF Pending bed offer ? ?Expected Discharge Plan and Services ?Expected Discharge Plan: Skilled Nursing Facility ?In-house Referral: Clinical Social Work ?  ?Post Acute Care Choice: Skilled Nursing Facility ?Living arrangements for the past 2 months: Single Family Home, Skilled Nursing Facility ?                ?  ?  ?  ?  ?  ?  ?  ?  ?  ?  ? ? ?Social Determinants of Health (SDOH) Interventions ?  ? ?Readmission Risk Interventions ? ?  01/28/2022  ? 10:17 AM  ?Readmission Risk Prevention Plan  ?Transportation Screening Complete  ?PCP or Specialist Appt within 5-7 Days Complete  ?Home Care Screening Complete  ?Medication Review (RN CM) Complete  ? ? ?

## 2022-04-09 NOTE — Progress Notes (Signed)
Pt states he does not want to wear his home cpap tonight but will let RN know if he changes his mind at any point.  ?

## 2022-04-10 DIAGNOSIS — E119 Type 2 diabetes mellitus without complications: Secondary | ICD-10-CM | POA: Diagnosis not present

## 2022-04-10 DIAGNOSIS — E162 Hypoglycemia, unspecified: Secondary | ICD-10-CM | POA: Diagnosis not present

## 2022-04-10 DIAGNOSIS — R748 Abnormal levels of other serum enzymes: Secondary | ICD-10-CM | POA: Diagnosis not present

## 2022-04-10 DIAGNOSIS — E875 Hyperkalemia: Secondary | ICD-10-CM | POA: Diagnosis not present

## 2022-04-10 LAB — GLUCOSE, CAPILLARY
Glucose-Capillary: 175 mg/dL — ABNORMAL HIGH (ref 70–99)
Glucose-Capillary: 286 mg/dL — ABNORMAL HIGH (ref 70–99)
Glucose-Capillary: 181 mg/dL — ABNORMAL HIGH (ref 70–99)
Glucose-Capillary: 206 mg/dL — ABNORMAL HIGH (ref 70–99)

## 2022-04-10 MED ORDER — SODIUM CHLORIDE 0.9 % IV BOLUS
500.0000 mL | Freq: Once | INTRAVENOUS | Status: AC
  Administered 2022-04-10: 500 mL via INTRAVENOUS

## 2022-04-10 NOTE — Progress Notes (Signed)
?   04/10/22 0200  ?Assess: MEWS Score  ?BP 105/76  ?Pulse Rate (!) 105  ?ECG Heart Rate (!) 111  ?Resp 18  ?Level of Consciousness Alert  ?SpO2 97 %  ?O2 Device Nasal Cannula  ?O2 Flow Rate (L/min) 2 L/min  ?Assess: MEWS Score  ?MEWS Temp 0  ?MEWS Systolic 0  ?MEWS Pulse 2  ?MEWS RR 0  ?MEWS LOC 0  ?MEWS Score 2  ?MEWS Score Color Yellow  ?Assess: if the MEWS score is Yellow or Red  ?Were vital signs taken at a resting state? Yes  ?Focused Assessment No change from prior assessment  ?Early Detection of Sepsis Score *See Row Information* Medium  ?MEWS guidelines implemented *See Row Information* No, vital signs rechecked  ?Treat  ?MEWS Interventions Other (Comment) ?(patient resting at this time with no complaints)  ?Pain Scale 0-10  ?Pain Score Asleep  ?Escalate  ?MEWS: Escalate Yellow: discuss with charge nurse/RN and consider discussing with provider and RRT  ?Notify: Charge Nurse/RN  ?Name of Charge Nurse/RN Notified Darl Pikes RN  ?Date Charge Nurse/RN Notified 04/10/22  ?Time Charge Nurse/RN Notified 0330  ?Notify: Provider  ?Provider Name/Title Dr.Rathore  ?Date Provider Notified 04/10/22  ?Time Provider Notified [phone]  ?Method of Notification Page  ?Notification Reason Other (Comment) ?(yellow mews, soft BP's, tachycardia)  ?Provider response See new orders  ?Date of Provider Response 04/10/22  ?Time of Provider Response 0400  ?Provider Notification  ?Provider Name/Title Dr. Loney Loh  ?Notification Type Page  ?Notification Reason Other (Comment) ?(yellow MEWs, elevated heart rate)  ? ? ?

## 2022-04-10 NOTE — Progress Notes (Signed)
Pt has home CPAP at bedside but states he does not want to wear tonight.  ?

## 2022-04-10 NOTE — Progress Notes (Signed)
?      ?                 PROGRESS NOTE ? ?      ?PATIENT DETAILS ?Name: Christian [name] ?Age: 71 y.o. ?Sex: male ?Date of Birth: Feb 08, 1951 ?Admit Date: 03/30/2022 ?Admitting Physician Rise Patience, MD ?PCP:Sun, Gari Crown, MD ? ?Brief Summary: ?Patient is a 71 y.o.  male with a history of A-fib on Xarelto, HFpEF, PAD, DM-2, bipolar disorder-presented with generalized weakness/sacral decubitus ulcer-was found to have hypoglycemia, hyperkalemia and subsequently admitted to the hospitalist service-see below for further details. ? ?Significant events: ?5/2>> admit to [facility] ? ?Significant studies: ?5/3>> x-ray pelvis: No fracture. ?5/3>> CT head: No acute intracranial findings. ?5/3>> x-ray left shoulder: Rotator cuff impingement ? ?Significant microbiology data: ? ? ?Procedures: ? ? ?Consults: ?Neurology, psychiatry ? ?Subjective: ?Denies any chest pain or shortness of breath.  Lying comfortably in bed. ? ?Objective: ?Vitals: ?Blood pressure (!) 109/91, pulse 97, temperature 98.2 ?F (36.8 ?C), temperature source Axillary, resp. rate 20, weight 109.6 kg, SpO2 97 %.  ? ?Exam: ?Gen Exam:Alert awake-not in any distress ?HEENT:atraumatic, normocephalic ?Chest: B/L clear to auscultation anteriorly ?CVS:S1S2 regular ?Abdomen:soft non tender, non distended ?Extremities:no edema ?Neurology: Non focal ?Skin: no rash  ? ?Pertinent Labs/Radiology: ? ?  Latest Ref Rng & Units 04/06/2022  ?  4:13 AM 04/02/2022  ?  2:38 AM 04/01/2022  ?  7:58 AM  ?CBC  ?WBC 4.0 - 10.5 K/uL 5.8   6.2   5.7    ?Hemoglobin 13.0 - 17.0 g/dL 13.1   13.2   13.2    ?Hematocrit 39.0 - 52.0 % 42.1   41.9   42.5    ?Platelets 150 - 400 K/uL 222   235   239    ?  ?Lab Results  ?Component Value Date  ? NA 133 (L) 04/09/2022  ? K 4.6 04/09/2022  ? CL 97 (L) 04/09/2022  ? CO2 30 04/09/2022  ? ?  ? ? 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  Managed with supportive care.  After  neurology/psych evaluation-has been resumed on Abilify and Cogentin-Wellbutrin/Seroquel has been discontinued. ? ?AKI on CKD stage IIIa: AKI likely hemodynamically mediated-improved. ? ?Mild rhabdomyolysis: Nontraumatic-improved with supportive care. ? ?Hyperkalemia: Hold with Lokelma/Demadex-continue.   ? ?Hypoglycemia: Resolved with supportive care.  Do not resume Amaryl on discharge. ? ?DM-2 (A1c 7.6 on 4/15): CBG better-continue Semglee 10 units, for needs of NovoLog with meals and SSI.  Avoid tight glycemic control.  Allow some amount of permissive hyperglycemia.   ? ?Recent Labs  ?  04/09/22 ?1624 04/09/22 ?2102 04/10/22 ?0836  ?GLUCAP 260* 168* 175*  ? ?  ? ?Hypothyroidism: Continue Cytomel. ? ?PAF: Rate controlled-continue Xarelto ? ?PAD: Follow-up with vascular surgery in the outpatient setting ? ?HFpEF: Volume status stable.   ? ?Bipolar disorder: Given concerns for EPS-on Abilify and Cogentin-Depakote/Wellbutrin and Seroquel held. ? ?Left shoulder pain: Supportive care-likely rotator cuff injury.  Stable to be followed up by orthopedics in the outpatient setting ? ?OSA: CPAP nightly ? ?Debility/deconditioning: Plans are of SNF on discharge. ? ?Nutrition Status: ?Nutrition Problem: Increased nutrient needs ?Etiology: chronic illness, wound healing ?Signs/Symptoms: estimated needs ?Interventions: Ensure Enlive (each supplement provides 350kcal and 20 grams of protein), MVI, Liberalize Diet ? ?Pressure Ulcer: ?Pressure Injury 03/31/22 Sacrum Mid Stage 3 -  Full thickness tissue loss. Subcutaneous fat may be visible but bone, tendon or  muscle are NOT exposed. (Active)  ?03/31/22 1933  ?Location: Sacrum  ?Location Orientation: Mid  ?Staging: Stage 3 -  Full thickness tissue loss. Subcutaneous fat may be visible but bone, tendon or muscle are NOT exposed.  ?Wound Description (Comments):   ?Present on Admission: Yes  ?Dressing Type Foam - Lift dressing to assess site every shift 04/09/22 2000   ? ?Obesity: ?Estimated body mass index is 32.77 kg/m? as calculated from the following: ?  Height as of 03/12/22: 6' (1.829 m). ?  Weight as of this encounter: 109.6 kg.  ? ?Code status: ?  Code Status: Full Code  ? ?DVT Prophylaxis: ?rivaroxaban (XARELTO) tablet 20 mg   ? ?Family Communication: None at bedside ? ? ?Disposition Plan: ?Status is: Inpatient ?Remains inpatient appropriate because: Awaiting SNF bed. ?  ?Planned Discharge Destination:Skilled nursing facility ? ? ?Diet: ?Diet Order   ? ?       ?  DIET DYS 3 Room service appropriate? Yes; Fluid consistency: Thin  Diet effective now       ?  ? ?  ?  ? ?  ?  ? ? ?Antimicrobial agents: ?Anti-infectives (From admission, onward)  ? ? None  ? ?  ? ? ? ?MEDICATIONS: ?Scheduled Meds: ? ARIPiprazole  10 mg Oral q morning  ? benztropine  1 mg Oral BID  ? feeding supplement  237 mL Oral BID BM  ? insulin aspart  0-9 Units Subcutaneous TID WC  ? insulin aspart  4 Units Subcutaneous TID WC  ? insulin glargine-yfgn  10 Units Subcutaneous Daily  ? liothyronine  10 mcg Oral Daily  ? multivitamin with minerals  1 tablet Oral Daily  ? rivaroxaban  20 mg Oral Q supper  ? senna-docusate  2 tablet Oral BID  ? sodium zirconium cyclosilicate  10 g Oral BID  ? ?Continuous Infusions: ?PRN Meds:.acetaminophen **OR** acetaminophen, bisacodyl, HYDROcodone-acetaminophen, melatonin ? ? ?I have personally reviewed following labs and imaging studies ? ?LABORATORY DATA: ?CBC: ?Recent Labs  ?Lab 04/06/22 ?0413  ?WBC 5.8  ?HGB 13.1  ?HCT 42.1  ?MCV 84.4  ?PLT 222  ? ? ? ?Basic Metabolic Panel: ?Recent Labs  ?Lab 04/04/22 ?0108 04/06/22 ?0413 04/07/22 ?0108 04/08/22 ?0112 04/09/22 ?0309  ?NA 134* 132* 133* 132* 133*  ?K 5.3* 5.5* 5.3* 4.8 4.6  ?CL 100 101 101 99 97*  ?CO2 26 25 26 24 30   ?GLUCOSE 140* 215* 174* 257* 242*  ?BUN 29* 31* 29* 33* 34*  ?CREATININE 1.47* 1.37* 1.44* 1.51* 1.67*  ?CALCIUM 9.4 9.3 9.5 9.4 9.6  ? ? ? ?GFR: ?Estimated Creatinine Clearance: 54.1 mL/min (A) (by C-G  formula based on SCr of 1.67 mg/dL (H)). ? ?Liver Function Tests: ?No results for input(s): AST, ALT, ALKPHOS, BILITOT, PROT, ALBUMIN in the last 168 hours. ?No results for input(s): LIPASE, AMYLASE in the last 168 hours. ?No results for input(s): AMMONIA in the last 168 hours. ? ?Coagulation Profile: ?No results for input(s): INR, PROTIME in the last 168 hours. ? ?Cardiac Enzymes: ?No results for input(s): CKTOTAL, CKMB, CKMBINDEX, TROPONINI in the last 168 hours. ? ?BNP (last 3 results) ?No results for input(s): PROBNP in the last 8760 hours. ? ?Lipid Profile: ?No results for input(s): CHOL, HDL, LDLCALC, TRIG, CHOLHDL, LDLDIRECT in the last 72 hours. ? ?Thyroid Function Tests: ?No results for input(s): TSH, T4TOTAL, FREET4, T3FREE, THYROIDAB in the last 72 hours. ? ?Anemia Panel: ?No results for input(s): VITAMINB12, FOLATE, FERRITIN, TIBC, IRON, RETICCTPCT in the last 72 hours. ? ?  Urine analysis: ?   ?Component Value Date/Time  ? COLORURINE YELLOW 03/17/2022 1538  ? APPEARANCEUR CLEAR 03/17/2022 1538  ? APPEARANCEUR Cloudy 08/01/2014 1614  ? LABSPEC 1.016 03/17/2022 1538  ? LABSPEC 1.008 08/01/2014 1614  ? PHURINE 5.0 03/17/2022 1538  ? GLUCOSEU >=500 (A) 03/17/2022 1538  ? GLUCOSEU Negative 08/01/2014 1614  ? HGBUR NEGATIVE 03/17/2022 1538  ? [place] NEGATIVE 03/17/2022 1538  ? BILIRUBINUR Negative 08/01/2014 1614  ? [place] NEGATIVE 03/17/2022 1538  ? PROTEINUR NEGATIVE 03/17/2022 1538  ? UROBILINOGEN 0.2 07/01/2014 1809  ? NITRITE NEGATIVE 03/17/2022 1538  ? LEUKOCYTESUR NEGATIVE 03/17/2022 1538  ? LEUKOCYTESUR Negative 08/01/2014 1614  ? ? ?Sepsis Labs: ?Lactic Acid, Venous ?   ?Component Value Date/Time  ? LATICACIDVEN 1.7 03/11/2022 2247  ? ? ?MICROBIOLOGY: ?No results found for this or any previous visit (from the past 240 hour(s)). ? ?RADIOLOGY STUDIES/RESULTS: ?No results found. ? ? LOS: 10 days  ? ?Oren Binet, MD  ?[facility] ? ? ? ?To contact the attending provider between 7A-7P or  the covering provider during after hours 7P-7A, please log into the web site [email or web address] and access using universal [place] password for that web site. If you do not have the password, please call the hospital operator. ?

## 2022-04-11 ENCOUNTER — Inpatient Hospital Stay (HOSPITAL_COMMUNITY): Payer: Medicare HMO

## 2022-04-11 DIAGNOSIS — R748 Abnormal levels of other serum enzymes: Secondary | ICD-10-CM | POA: Diagnosis not present

## 2022-04-11 DIAGNOSIS — E875 Hyperkalemia: Secondary | ICD-10-CM | POA: Diagnosis not present

## 2022-04-11 DIAGNOSIS — R509 Fever, unspecified: Secondary | ICD-10-CM

## 2022-04-11 DIAGNOSIS — E119 Type 2 diabetes mellitus without complications: Secondary | ICD-10-CM | POA: Diagnosis not present

## 2022-04-11 DIAGNOSIS — E162 Hypoglycemia, unspecified: Secondary | ICD-10-CM | POA: Diagnosis not present

## 2022-04-11 LAB — BASIC METABOLIC PANEL
Anion gap: 5 (ref 5–15)
BUN: 42 mg/dL — ABNORMAL HIGH (ref 8–23)
CO2: 27 mmol/L (ref 22–32)
Calcium: 9 mg/dL (ref 8.9–10.3)
Chloride: 98 mmol/L (ref 98–111)
Creatinine, Ser: 1.46 mg/dL — ABNORMAL HIGH (ref 0.61–1.24)
GFR, Estimated: 52 mL/min — ABNORMAL LOW (ref >60)
Glucose, Bld: 300 mg/dL — ABNORMAL HIGH (ref 70–99)
Potassium: 4.9 mmol/L (ref 3.5–5.1)
Sodium: 130 mmol/L — ABNORMAL LOW (ref 135–145)

## 2022-04-11 LAB — LACTIC ACID, PLASMA
Lactic Acid, Venous: 1.9 mmol/L (ref 0.5–1.9)
Lactic Acid, Venous: 1.8 mmol/L (ref 0.5–1.9)

## 2022-04-11 LAB — CBC
HCT: 41.9 % (ref 39.0–52.0)
Hemoglobin: 13.2 g/dL (ref 13.0–17.0)
MCH: 26.7 pg (ref 26.0–34.0)
MCHC: 31.5 g/dL (ref 30.0–36.0)
MCV: 84.6 fL (ref 80.0–100.0)
Platelets: 275 10*3/uL (ref 150–400)
RBC: 4.95 MIL/uL (ref 4.22–5.81)
RDW: 17.2 % — ABNORMAL HIGH (ref 11.5–15.5)
WBC: 10.2 10*3/uL (ref 4.0–10.5)
nRBC: 0 % (ref 0.0–0.2)

## 2022-04-11 LAB — URINALYSIS, ROUTINE W REFLEX MICROSCOPIC
Bilirubin Urine: NEGATIVE
Glucose, UA: >=500 mg/dL — AB
Hgb urine dipstick: NEGATIVE
Ketones, ur: NEGATIVE mg/dL
Leukocytes,Ua: NEGATIVE
Nitrite: NEGATIVE
Protein, ur: NEGATIVE mg/dL
Specific Gravity, Urine: 1.017 (ref 1.005–1.030)
pH: 7 (ref 5.0–8.0)

## 2022-04-11 LAB — GLUCOSE, CAPILLARY
Glucose-Capillary: 201 mg/dL — ABNORMAL HIGH (ref 70–99)
Glucose-Capillary: 186 mg/dL — ABNORMAL HIGH (ref 70–99)
Glucose-Capillary: 233 mg/dL — ABNORMAL HIGH (ref 70–99)
Glucose-Capillary: 156 mg/dL — ABNORMAL HIGH (ref 70–99)

## 2022-04-11 LAB — URIC ACID: Uric Acid, Serum: 7.3 mg/dL (ref 3.7–8.6)

## 2022-04-11 LAB — PROCALCITONIN: Procalcitonin: 0.14 ng/mL

## 2022-04-11 MED ORDER — SODIUM CHLORIDE 0.9 % IV BOLUS
500.0000 mL | Freq: Once | INTRAVENOUS | Status: AC
  Administered 2022-04-11: 500 mL via INTRAVENOUS

## 2022-04-11 MED ORDER — SODIUM CHLORIDE 0.9 % IV BOLUS
1000.0000 mL | Freq: Once | INTRAVENOUS | Status: AC
  Administered 2022-04-11: 1000 mL via INTRAVENOUS

## 2022-04-11 MED ORDER — CEFEPIME HCL 2 G IV SOLR
2.0000 g | Freq: Three times a day (TID) | INTRAVENOUS | Status: DC
  Administered 2022-04-11 – 2022-04-12 (×4): 2 g via INTRAVENOUS
  Filled 2022-04-11 (×4): qty 12.5

## 2022-04-11 MED ORDER — VANCOMYCIN HCL 1250 MG/250ML IV SOLN
1250.0000 mg | INTRAVENOUS | Status: DC
  Administered 2022-04-12: 1250 mg via INTRAVENOUS
  Filled 2022-04-11: qty 250

## 2022-04-11 MED ORDER — VANCOMYCIN HCL 2000 MG/400ML IV SOLN
2000.0000 mg | Freq: Once | INTRAVENOUS | Status: AC
  Administered 2022-04-11: 2000 mg via INTRAVENOUS
  Filled 2022-04-11: qty 400

## 2022-04-11 MED ORDER — SODIUM CHLORIDE 0.9 % IV SOLN: INTRAVENOUS | Status: DC

## 2022-04-11 MED ORDER — DICLOFENAC SODIUM 1 % EX GEL
2.0000 g | Freq: Four times a day (QID) | CUTANEOUS | Status: DC
  Administered 2022-04-11 – 2022-05-20 (×129): 2 g via TOPICAL
  Filled 2022-04-11 (×3): qty 100

## 2022-04-11 MED ORDER — METRONIDAZOLE 500 MG/100ML IV SOLN
500.0000 mg | Freq: Two times a day (BID) | INTRAVENOUS | Status: DC
Start: 2022-04-11 — End: 2022-04-13
  Administered 2022-04-11 – 2022-04-13 (×5): 500 mg via INTRAVENOUS
  Filled 2022-04-11 (×5): qty 100

## 2022-04-11 MED ORDER — INSULIN GLARGINE-YFGN 100 UNIT/ML ~~LOC~~ SOLN
12.0000 [IU] | Freq: Every day | SUBCUTANEOUS | Status: DC
  Administered 2022-04-11 – 2022-04-13 (×3): 12 [IU] via SUBCUTANEOUS
  Filled 2022-04-11 (×3): qty 0.12

## 2022-04-11 NOTE — Progress Notes (Signed)
?   04/11/22 0500  ?Assess: MEWS Score  ?Temp (!) 102.3 ?F (39.1 ?C)  ?BP 94/70  ?Pulse Rate (!) 130  ?ECG Heart Rate (!) 135  ?Resp (!) 30  ?SpO2 100 %  ?O2 Device Nasal Cannula  ?O2 Flow Rate (L/min) 3 L/min  ?Assess: MEWS Score  ?MEWS Temp 2  ?MEWS Systolic 1  ?MEWS Pulse 3  ?MEWS RR 2  ?MEWS LOC 0  ?MEWS Score 8  ?MEWS Score Color Red  ?Assess: if the MEWS score is Yellow or Red  ?Were vital signs taken at a resting state? Yes  ?Treat  ?Pain Scale 0-10  ?Pain Score Asleep  ?Take Vital Signs  ?Increase Vital Sign Frequency  Red: Q 1hr X 4 then Q 4hr X 4, if remains red, continue Q 4hrs  ?Escalate  ?MEWS: Escalate Red: discuss with charge nurse/RN and provider, consider discussing with RRT  ?Notify: Charge Nurse/RN  ?Name of Charge Nurse/RN Notified Nikki RN  ?Date Charge Nurse/RN Notified 04/11/22  ?Time Charge Nurse/RN Notified 0500  ?Notify: Rapid Response  ?Name of Rapid Response RN Notified Mindy RN  ?Date Rapid Response Notified 04/11/22  ?Time Rapid Response Notified 0500  ?Document  ?Patient Outcome Stabilized after interventions  ?Progress note created (see row info) Yes  ? ? ?

## 2022-04-11 NOTE — Progress Notes (Signed)
?   04/11/22 0600  ?Assess: MEWS Score  ?Temp 100.2 ?F (37.9 ?C)  ?BP 99/72  ?Pulse Rate (!) 102  ?ECG Heart Rate (!) 102  ?Resp (!) 23  ?SpO2 100 %  ?O2 Device Nasal Cannula  ?O2 Flow Rate (L/min) 3 L/min  ?Assess: MEWS Score  ?MEWS Temp 0  ?MEWS Systolic 1  ?MEWS Pulse 1  ?MEWS RR 1  ?MEWS LOC 0  ?MEWS Score 3  ?MEWS Score Color Yellow  ?Assess: if the MEWS score is Yellow or Red  ?Were vital signs taken at a resting state? Yes  ?Treat  ?Pain Scale 0-10  ?Pain Score Asleep  ?Take Vital Signs  ?Increase Vital Sign Frequency  Yellow: Q 2hr X 2 then Q 4hr X 2, if remains yellow, continue Q 4hrs  ?Escalate  ?MEWS: Escalate Yellow: discuss with charge nurse/RN and consider discussing with provider and RRT  ?Notify: Charge Nurse/RN  ?Name of Charge Nurse/RN Notified [place] RN  ?Date Charge Nurse/RN Notified 04/11/22  ?Time Charge Nurse/RN Notified 0600  ?Document  ?Patient Outcome Stabilized after interventions  ?Progress note created (see row info) Yes  ? ? ?

## 2022-04-11 NOTE — Progress Notes (Signed)
Patient has oral temp 103.1 with increase heart rate and shivering. Notified MD, gave tylenol suppository due to patient being more confused and not following commands. New order received. ?

## 2022-04-11 NOTE — Progress Notes (Signed)
?   04/11/22 0330  ?Assess: MEWS Score  ?Temp (!) 103.2 ?F (39.6 ?C)  ?BP (!) 127/100  ?ECG Heart Rate (!) 131  ?Resp 20  ?SpO2 100 %  ?O2 Device Nasal Cannula  ?O2 Flow Rate (L/min) 2 L/min  ?Assess: MEWS Score  ?MEWS Temp 2  ?MEWS Systolic 0  ?MEWS Pulse 3  ?MEWS RR 0  ?MEWS LOC 0  ?MEWS Score 5  ?MEWS Score Color Red  ?Assess: if the MEWS score is Yellow or Red  ?Were vital signs taken at a resting state? Yes  ?Focused Assessment No change from prior assessment  ?Early Detection of Sepsis Score *See Row Information* High  ?MEWS guidelines implemented *See Row Information* Yes  ?Treat  ?MEWS Interventions Administered prn meds/treatments;Escalated (See documentation below)  ?Pain Scale 0-10  ?Pain Score 9  ?Pain Type Acute pain  ?Pain Location Buttocks  ?Pain Orientation Right;Left  ?Pain Descriptors / Indicators Aching;Discomfort  ?Pain Frequency Constant  ?Pain Onset On-going  ?Pain Intervention(s) Medication (See eMAR);MD notified (Comment)  ?Take Vital Signs  ?Increase Vital Sign Frequency  Red: Q 1hr X 4 then Q 4hr X 4, if remains red, continue Q 4hrs  ?Escalate  ?MEWS: Escalate Red: discuss with charge nurse/RN and provider, consider discussing with RRT  ?Notify: Charge Nurse/RN  ?Name of Charge Nurse/RN Notified Nikki RN  ?Date Charge Nurse/RN Notified 04/11/22  ?Time Charge Nurse/RN Notified 0400  ?Notify: Provider  ?Provider Name/Title Dr.Rathore  ?Date Provider Notified 04/11/22  ?Time Provider Notified [phone]  ?Method of Notification Page  ?Notification Reason Change in status  ?Provider response See new orders  ?Date of Provider Response 04/11/22  ?Time of Provider Response 0400  ?Provider Notification  ?Provider Name/Title Dr.Rathore  ?Notification Type Call  ?Notification Reason Change in status  ? ?Sepsis protocol ordered by MD. Patient alert and oriented. No change in patient status beside vital signs. PRN Tylenol and pain medication administered for fever and pain. Provider made aware of patient's  temperature increase, increased heart rate, buttocks/sacral wound with increase in odor, increase in WBC.  ?

## 2022-04-11 NOTE — Progress Notes (Signed)
Overnight progress note ? ?Informed by RN that patient is febrile with temperature 103.2 ?F and tachycardic to the 130s, sinus rhythm. Blood pressure 127/100.  Mildly tachypneic with respiratory rate in the low 20s but stable on 2 L supplemental oxygen, satting 100%.   ? ?Labs done this morning showing WBC 10.2, increased from 5.8 on labs done 5 days ago. ? ?Patient has a stage III sacral decubitus ulcer and RN noticed foul-smelling drainage concerning for possible infection. ? ?-Blood cultures ?-Stat lactate and procalcitonin ?-Start vancomycin and cefepime ?-1 L IV fluid bolus ordered.  Previous echo with EF 55 to 60%, grade 1 diastolic dysfunction.  Monitor volume status closely. ?-Tylenol as needed for fevers ?-UA and chest x-ray ordered to rule out other causes of infection ? ?

## 2022-04-11 NOTE — Progress Notes (Signed)
Hourly vital signs interrupted due to phlebotomy and chest x-ray at patients bedside. Phlebotomist had difficulty obtaining blood cultures and lab work.  Blood pressure cuff was loosened to collect blood work. VS obtained as soon as lab, IV team, and radiology left patient's bedside. IV antibiotics and IV bolus have not been initiated due to loss of IV access. This RN and IV team having difficulty obtaining new access. Rapid response nurse notified. Dr. Marlowe Sax notified as well.  ?

## 2022-04-11 NOTE — Progress Notes (Addendum)
?      ?                 PROGRESS NOTE ? ?      ?PATIENT DETAILS ?Name: Christian [name] ?Age: 71 y.o. ?Sex: male ?Date of Birth: 10-23-1951 ?Admit Date: 03/30/2022 ?Admitting Physician Rise Patience, MD ?PCP:Sun, Gari Crown, MD ? ?Brief Summary: ?Patient is a 71 y.o.  male with a history of A-fib on Xarelto, HFpEF, PAD, DM-2, bipolar disorder-presented with generalized weakness/sacral decubitus ulcer-was found to have hypoglycemia, hyperkalemia and subsequently admitted to the hospitalist service-see below for further details. ? ?Significant events: ?5/2>> admit to [facility] ?5/14>> febrile/tachycardic-Per nursing staff-discharge from prior existing sacral decubitus ulcer.  Started on broad-spectrum IV antibiotics ? ?Significant studies: ?5/3>> x-ray pelvis: No fracture. ?5/3>> CT head: No acute intracranial findings. ?5/3>> x-ray left shoulder: Rotator cuff impingement ?5/14>> CXR:No Obvious PNA ? ?Significant microbiology data: ?5/14>> blood culture: Pending ? ?Procedures: ? ? ?Consults: ?Neurology, psychiatry ? ?Subjective: ?Having some chills this morning but otherwise comfortable. ? ?Objective: ?Vitals: ?Blood pressure 105/67, pulse 97, temperature 100.1 ?F (37.8 ?C), temperature source Oral, resp. rate (!) 24, weight 109.6 kg, SpO2 100 %.  ? ?Exam: ?Gen Exam:not in any distress ?HEENT:atraumatic, normocephalic ?Chest: B/L clear to auscultation anteriorly ?CVS:S1S2 regular ?Abdomen:soft non tender, non distended ?Extremities:no edema ?Neurology: Nonfocal but with generalized weakness. ?Skin: no rash  ? ?Pertinent Labs/Radiology: ? ?  Latest Ref Rng & Units 04/11/2022  ?  1:04 AM 04/06/2022  ?  4:13 AM 04/02/2022  ?  2:38 AM  ?CBC  ?WBC 4.0 - 10.5 K/uL 10.2   5.8   6.2    ?Hemoglobin 13.0 - 17.0 g/dL 13.2   13.1   13.2    ?Hematocrit 39.0 - 52.0 % 41.9   42.1   41.9    ?Platelets 150 - 400 K/uL 275   222   235    ?  ?Lab Results  ?Component Value Date  ? NA 130 (L) 04/11/2022  ? K 4.9 04/11/2022  ? CL 98 04/11/2022  ? CO2  27 04/11/2022  ? ?  ? ? 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  Managed with supportive care.  After neurology/psych evaluation-has been resumed on Abilify and Cogentin-Wellbutrin/Seroquel has been discontinued. ? ?AKI on CKD stage IIIa: AKI likely hemodynamically mediated-improved. ? ?Mild rhabdomyolysis: Nontraumatic-improved with supportive care. ? ?Fever-possibly sacral decubitus ulcer with infection: Continue broad-spectrum antimicrobial therapy-wound care consultation placed.  Await cultures. ? ?Hyperkalemia: Hold with Lokelma/Demadex-continue.   ? ?Hypoglycemia: Resolved with supportive care.  Do not resume Amaryl on discharge. ? ?DM-2 (A1c 7.6 on 4/15): CBG although better still on the higher side-increase Semglee to 12 units, continue 4 units of NovoLog with meals and SSI.  Avoid tight glycemic control in this frail patient.  ? ?Recent Labs  ?  04/10/22 ?1629 04/10/22 ?2059 04/11/22 ?[id number]  ?GLUCAP 181* 206* 201*  ? ?  ? ?Hypothyroidism: Continue Cytomel. ? ?PAF: Rate controlled-continue Xarelto ? ?PAD: Follow-up with vascular surgery in the outpatient setting ? ?HFpEF: Volume status stable.   ? ?Bipolar disorder: Given concerns for EPS-on Abilify and Cogentin-Depakote/Wellbutrin and Seroquel held. ? ?Left shoulder pain: Supportive care-likely rotator cuff injury.  Stable to be followed up by orthopedics in the outpatient setting ? ?OSA: CPAP nightly ? ?Debility/deconditioning: Plans are of SNF on discharge. ? ?Nutrition Status: ?Nutrition Problem: Increased nutrient needs ?Etiology: chronic illness, wound healing ?Signs/Symptoms: estimated needs ?  Interventions: Ensure Enlive (each supplement provides 350kcal and 20 grams of protein), MVI, Liberalize Diet ? ?Pressure Ulcer: ?Pressure Injury 03/31/22 Sacrum Mid Stage 3 -  Full thickness tissue loss. Subcutaneous fat may be visible but bone, tendon or muscle are NOT  exposed. (Active)  ?03/31/22 1933  ?Location: Sacrum  ?Location Orientation: Mid  ?Staging: Stage 3 -  Full thickness tissue loss. Subcutaneous fat may be visible but bone, tendon or muscle are NOT exposed.  ?Wound Description (Comments):   ?Present on Admission: Yes  ?Dressing Type Foam - Lift dressing to assess site every shift 04/10/22 2000  ? ?Obesity: ?Estimated body mass index is 32.77 kg/m? as calculated from the following: ?  Height as of 03/12/22: 6' (1.829 m). ?  Weight as of this encounter: 109.6 kg.  ? ?Code status: ?  Code Status: Full Code  ? ?DVT Prophylaxis: ?rivaroxaban (XARELTO) tablet 20 mg   ? ?Family Communication: [name]-[phone]-called on 5/14-voicemail full unable to leave message. ? ? ?Disposition Plan: ?Status is: Inpatient ?Remains inpatient appropriate because: Awaiting SNF bed. ?  ?Planned Discharge Destination:Skilled nursing facility ? ? ?Diet: ?Diet Order   ? ?       ?  DIET DYS 3 Room service appropriate? Yes; Fluid consistency: Thin  Diet effective now       ?  ? ?  ?  ? ?  ?  ? ? ?Antimicrobial agents: ?Anti-infectives (From admission, onward)  ? ? Start     Dose/Rate Route Frequency Ordered Stop  ? 04/12/22 0600  vancomycin (VANCOREADY) IVPB 1250 mg/250 mL       ? 1,250 mg ?166.7 mL/hr over 90 Minutes Intravenous Every 24 hours 04/11/22 0521    ? 04/11/22 1145  metroNIDAZOLE (FLAGYL) IVPB 500 mg       ? 500 mg ?100 mL/hr over 60 Minutes Intravenous Every 12 hours 04/11/22 1055    ? 04/11/22 0600  ceFEPIme (MAXIPIME) 2 g in sodium chloride 0.9 % 100 mL IVPB       ? 2 g ?200 mL/hr over 30 Minutes Intravenous Every 8 hours 04/11/22 0437    ? 04/11/22 0530  vancomycin (VANCOREADY) IVPB 2000 mg/400 mL       ? 2,000 mg ?200 mL/hr over 120 Minutes Intravenous  Once 04/11/22 0437 04/11/22 0748  ? ?  ? ? ? ?MEDICATIONS: ?Scheduled Meds: ? ARIPiprazole  10 mg Oral q morning  ? benztropine  1 mg Oral BID  ? feeding supplement  237 mL Oral BID BM  ? insulin aspart  0-9 Units  Subcutaneous TID WC  ? insulin aspart  4 Units Subcutaneous TID WC  ? insulin glargine-yfgn  12 Units Subcutaneous Daily  ? liothyronine  10 mcg Oral Daily  ? multivitamin with minerals  1 tablet Oral Daily  ? rivaroxaban  20 mg Oral Q supper  ? senna-docusate  2 tablet Oral BID  ? sodium zirconium cyclosilicate  10 g Oral BID  ? ?Continuous Infusions: ? ceFEPime (MAXIPIME) IV 2 g (04/11/22 0612)  ? metronidazole    ? [START ON 04/12/2022] vancomycin    ? ?PRN Meds:.acetaminophen **OR** acetaminophen, bisacodyl, HYDROcodone-acetaminophen, melatonin ? ? ?I have personally reviewed following labs and imaging studies ? ?LABORATORY DATA: ?CBC: ?Recent Labs  ?Lab 04/06/22 ?0413 04/11/22 ?0104  ?WBC 5.8 10.2  ?HGB 13.1 13.2  ?HCT 42.1 41.9  ?MCV 84.4 84.6  ?PLT 222 275  ? ? ? ?Basic Metabolic Panel: ?Recent Labs  ?Lab 04/06/22 ?0413 04/07/22 ?0108 04/08/22 ?  0112 04/09/22 ?0309 04/11/22 ?0104  ?NA 132* 133* 132* 133* 130*  ?K 5.5* 5.3* 4.8 4.6 4.9  ?CL 101 101 99 97* 98  ?CO2 25 26 24 30 27   ?GLUCOSE 215* 174* 257* 242* 300*  ?BUN 31* 29* 33* 34* 42*  ?CREATININE 1.37* 1.44* 1.51* 1.67* 1.46*  ?CALCIUM 9.3 9.5 9.4 9.6 9.0  ? ? ? ?GFR: ?Estimated Creatinine Clearance: 61.9 mL/min (A) (by C-G formula based on SCr of 1.46 mg/dL (H)). ? ?Liver Function Tests: ?No results for input(s): AST, ALT, ALKPHOS, BILITOT, PROT, ALBUMIN in the last 168 hours. ?No results for input(s): LIPASE, AMYLASE in the last 168 hours. ?No results for input(s): AMMONIA in the last 168 hours. ? ?Coagulation Profile: ?No results for input(s): INR, PROTIME in the last 168 hours. ? ?Cardiac Enzymes: ?No results for input(s): CKTOTAL, CKMB, CKMBINDEX, TROPONINI in the last 168 hours. ? ?BNP (last 3 results) ?No results for input(s): PROBNP in the last 8760 hours. ? ?Lipid Profile: ?No results for input(s): CHOL, HDL, LDLCALC, TRIG, CHOLHDL, LDLDIRECT in the last 72 hours. ? ?Thyroid Function Tests: ?No results for input(s): TSH, T4TOTAL, FREET4, T3FREE,  THYROIDAB in the last 72 hours. ? ?Anemia Panel: ?No results for input(s): VITAMINB12, FOLATE, FERRITIN, TIBC, IRON, RETICCTPCT in the last 72 hours. ? ?Urine analysis: ?   ?Component Value Date/Time  ? COLORU

## 2022-04-11 NOTE — Progress Notes (Signed)
Pharmacy Antibiotic Note ? ?Christian [name] is a 71 y.o. male admitted on 03/30/2022 with hyperkalemia, now w/ concern for sepsis.  Pharmacy has been consulted for vancomycin and cefepime dosing. ? ?Plan: ?Vancomycin 2000mg  x1 then 1250mg  IV Q24H. Goal AUC 400-550.  Expected AUC 470.  SCr 1.46.  ?Cefepime 2g IV Q8H. ? ?Weight: 109.6 kg (241 lb 10 oz) ? ?Temp (24hrs), Avg:100.2 ?F (37.9 ?C), Min:98.7 ?F (37.1 ?C), Max:103.2 ?F (39.6 ?C) ? ?Recent Labs  ?Lab 04/06/22 ?0413 04/07/22 ?0108 04/08/22 ?0112 04/09/22 ?0309 04/11/22 ?0104  ?WBC 5.8  --   --   --  10.2  ?CREATININE 1.37* 1.44* 1.51* 1.67* 1.46*  ?  ?Estimated Creatinine Clearance: 61.9 mL/min (A) (by C-G formula based on SCr of 1.46 mg/dL (H)).   ? ?Allergies  ?Allergen Reactions  ? Amlodipine Swelling and Other (See Comments)  ?  Legs became swollen  ? Onglyza [Saxagliptin] Other (See Comments)  ?  "Allergic," per Irving  ? Zyprexa [Olanzapine] Other (See Comments)  ?  Allergy noted by ACT Team. No reaction specified (??)- "Allergic," per Jones  ? Ritalin [Methylphenidate] Anxiety and Other (See Comments)  ?  "[place]"  ? ? ?Thank you for allowing pharmacy to be a part of this patient?s care. ? ?Wynona Neat, PharmD, BCPS  ?04/11/2022 5:21 AM ? ?

## 2022-04-11 NOTE — Sepsis Progress Note (Signed)
Elink following code sepsis °

## 2022-04-12 ENCOUNTER — Inpatient Hospital Stay (HOSPITAL_COMMUNITY): Payer: Medicare HMO

## 2022-04-12 ENCOUNTER — Other Ambulatory Visit: Payer: Self-pay

## 2022-04-12 ENCOUNTER — Encounter (HOSPITAL_COMMUNITY): Payer: Medicare HMO

## 2022-04-12 DIAGNOSIS — R748 Abnormal levels of other serum enzymes: Secondary | ICD-10-CM | POA: Diagnosis not present

## 2022-04-12 DIAGNOSIS — M7989 Other specified soft tissue disorders: Secondary | ICD-10-CM | POA: Diagnosis not present

## 2022-04-12 DIAGNOSIS — R7881 Bacteremia: Secondary | ICD-10-CM | POA: Diagnosis not present

## 2022-04-12 DIAGNOSIS — E119 Type 2 diabetes mellitus without complications: Secondary | ICD-10-CM | POA: Diagnosis not present

## 2022-04-12 DIAGNOSIS — E875 Hyperkalemia: Secondary | ICD-10-CM | POA: Diagnosis not present

## 2022-04-12 LAB — BLOOD CULTURE ID PANEL (REFLEXED) - BCID2

## 2022-04-12 LAB — COMPREHENSIVE METABOLIC PANEL
ALT: 14 U/L (ref 0–44)
AST: 21 U/L (ref 15–41)
Albumin: 2 g/dL — ABNORMAL LOW (ref 3.5–5.0)
Alkaline Phosphatase: 54 U/L (ref 38–126)
Anion gap: 5 (ref 5–15)
BUN: 41 mg/dL — ABNORMAL HIGH (ref 8–23)
CO2: 25 mmol/L (ref 22–32)
Calcium: 9 mg/dL (ref 8.9–10.3)
Chloride: 104 mmol/L (ref 98–111)
Creatinine, Ser: 1.47 mg/dL — ABNORMAL HIGH (ref 0.61–1.24)
GFR, Estimated: 52 mL/min — ABNORMAL LOW (ref >60)
Glucose, Bld: 105 mg/dL — ABNORMAL HIGH (ref 70–99)
Potassium: 4.8 mmol/L (ref 3.5–5.1)
Sodium: 134 mmol/L — ABNORMAL LOW (ref 135–145)
Total Bilirubin: 0.8 mg/dL (ref 0.3–1.2)
Total Protein: 6.6 g/dL (ref 6.5–8.1)

## 2022-04-12 LAB — CBC
HCT: 41.4 % (ref 39.0–52.0)
Hemoglobin: 13.1 g/dL (ref 13.0–17.0)
MCH: 27.1 pg (ref 26.0–34.0)
MCHC: 31.6 g/dL (ref 30.0–36.0)
MCV: 85.5 fL (ref 80.0–100.0)
Platelets: 245 10*3/uL (ref 150–400)
RBC: 4.84 MIL/uL (ref 4.22–5.81)
RDW: 17.1 % — ABNORMAL HIGH (ref 11.5–15.5)
WBC: 9.8 10*3/uL (ref 4.0–10.5)
nRBC: 0 % (ref 0.0–0.2)

## 2022-04-12 LAB — GLUCOSE, CAPILLARY
Glucose-Capillary: 109 mg/dL — ABNORMAL HIGH (ref 70–99)
Glucose-Capillary: 150 mg/dL — ABNORMAL HIGH (ref 70–99)
Glucose-Capillary: 159 mg/dL — ABNORMAL HIGH (ref 70–99)
Glucose-Capillary: 130 mg/dL — ABNORMAL HIGH (ref 70–99)

## 2022-04-12 MED ORDER — MEDIHONEY WOUND/BURN DRESSING EX PSTE
1.0000 "application " | PASTE | Freq: Every day | CUTANEOUS | Status: DC
  Administered 2022-04-13 – 2022-04-18 (×7): 1 via TOPICAL
  Filled 2022-04-12 (×3): qty 44

## 2022-04-12 MED ORDER — ORAL CARE MOUTH RINSE
15.0000 mL | Freq: Two times a day (BID) | OROMUCOSAL | Status: DC
  Administered 2022-04-12 – 2022-05-20 (×75): 15 mL via OROMUCOSAL

## 2022-04-12 MED ORDER — SODIUM CHLORIDE 0.9 % IV SOLN
2.0000 g | Freq: Every day | INTRAVENOUS | Status: DC
  Administered 2022-04-12 – 2022-04-13 (×2): 2 g via INTRAVENOUS
  Filled 2022-04-12 (×2): qty 20

## 2022-04-12 NOTE — Progress Notes (Signed)
PHARMACY - PHYSICIAN COMMUNICATION ?CRITICAL VALUE ALERT - BLOOD CULTURE IDENTIFICATION (BCID) ? ?Christian [name] is an 71 y.o. male who presented to [facility] on 03/30/2022 with a concern for sepsis ? ?Assessment:  GRAM POSITIVE COCCI IN CHAINS - Strep species - de-escalate to ceftriaxone  ? ?Name of physician (or Provider) Contacted: Ghimire  ? ?Current antibiotics: cefepime, flagyl, vancomycin -  de-escalate to ceftriaxone/ Flagyl ? ?Changes to prescribed antibiotics recommended:  ?Recommendations accepted by provider ? ?No results found for this or any previous visit. ? ?Christian [name] ?04/12/2022  6:30 AM ? ?

## 2022-04-12 NOTE — Progress Notes (Signed)
Physical Therapy Treatment ?Patient Details ?Name: Christian [name] ?MRN: [id number] ?DOB: Apr 12, 1951 ?Today's Date: 04/12/2022 ? ? ?History of Present Illness 71 y.o. male presented 5/2 with worsening strength , new decubitus ulcers and poor intake.  Recent admission for cellulitis and hypoglycemia, had just returned to ALF from SNF a week ago but not OOB since then.  Has new hypoglyemia, elevation of K+, creatinine, CK.  Ulcer is not infected but has LE skin is also irritated and has skin breakdown. LEFt UE swollen and Doppler ordered 5/15 to r/o DVT.  PMHx:  chronic venous stasis and chronic right shin unhealing wound, chronic HFpEF, IDDM, CAD with stenting x2 in 2021, PVD, CKD stage IIIa, HTN, DVT on Xarelto, bipolar disorder, OSA on CPAP at bedtime ? ?  ?PT Comments  ? ? Pt admitted with above diagnosis. Pt not progressing as he was having incr pain left UE as well as generalized pain.  Pt unable to move any body part except right UE. All other ROM was P/ROM.  Pt met 0/5 goals due to worsening medical status with VS issues as well as worsening sacral wound. REvised goals.   Pt currently with functional limitations due to balance and endurance deficits. Pt will benefit from skilled PT to increase their independence and safety with mobility to allow discharge to the venue listed below.      ?Recommendations for follow up therapy are one component of a multi-disciplinary discharge planning process, led by the attending physician.  Recommendations may be updated based on patient status, additional functional criteria and insurance authorization. ? ?Follow Up Recommendations ? Skilled nursing-short term rehab (<3 hours/day) ?  ?  ?Assistance Recommended at Discharge Frequent or constant Supervision/Assistance  ?Patient can return home with the following Two people to help with walking and/or transfers;Assistance with cooking/housework;Assist for transportation;Help with stairs or ramp for entrance;Two people to help  with bathing/dressing/bathroom;Assistance with feeding;Direct supervision/assist for medications management;Direct supervision/assist for financial management ?  ?Equipment Recommendations ? None recommended by PT  ?  ?Recommendations for Other Services   ? ? ?  ?Precautions / Restrictions Precautions ?Precautions: Fall ?Precaution Comments: monitor vitals, HR and BP esp ?Restrictions ?Weight Bearing Restrictions: No  ?  ? ?Mobility ? Bed Mobility ?Overal bed mobility: Needs Assistance ?Bed Mobility: Supine to Sit, Sit to Supine ?  ?  ?Supine to sit: Total assist ?  ?  ?General bed mobility comments: Unable to move pt to EOB as pt resisting movement and was in pain.  total assist to scoot pt to [facility]. Tried to put pt in chair position and he was in pain and refused to have HOB > 20 degrees. ?  ? ?Transfers ?  ?  ?  ?  ?  ?  ?  ?  ?  ?General transfer comment: unable ?  ? ?Ambulation/Gait ?  ?  ?  ?  ?  ?  ?  ?General Gait Details: unable ? ? ?Stairs ?  ?  ?  ?  ?  ? ? ?Wheelchair Mobility ?  ? ?Modified Rankin (Stroke Patients Only) ?  ? ? ?  ?Balance   ?  ?  ?  ?  ?  ?  ?  ?  ?  ?  ?  ?  ?  ?  ?  ?  ?  ?  ?  ? ?  ?Cognition Arousal/Alertness: Awake/alert ?Behavior During Therapy: Flat affect, Anxious ?Overall Cognitive Status: No family/caregiver present to determine baseline cognitive functioning ?  ?  ?  ?  ?  ?  ?  ?  ?  ?  ?  ?  ?  ?  ?  ?  ?  General Comments: slow processing. Severe fear of falling causing patient to become resistive to assistance for mobility ?  ?  ? ?  ?Exercises General Exercises - Lower Extremity ?Heel Slides: PROM, Both, 5 reps, Supine ?Hip ABduction/ADduction: PROM, Both, 5 reps, Supine ?Straight Leg Raises: PROM, Both, 5 reps, Supine ? ?  ?General Comments General comments (skin integrity, edema, etc.): VSS, left UE swollen and MD ordered to r/o DVT ?  ?  ? ?Pertinent Vitals/Pain Pain Assessment ?Pain Assessment: Faces ?Faces Pain Scale: Hurts whole lot ?Pain Location: sacrum,  generalized ?Pain Descriptors / Indicators: Grimacing, Guarding ?Pain Intervention(s): Limited activity within patient's tolerance, Monitored during session, Repositioned  ? ? ?Home Living   ?  ?  ?  ?  ?  ?  ?  ?  ?  ?   ?  ?Prior Function    ?  ?  ?   ? ?PT Goals (current goals can now be found in the care plan section) Acute Rehab PT Goals ?PT Goal Formulation: With patient ?Time For Goal Achievement: 04/26/22 ?Potential to Achieve Goals: Fair ?Progress towards PT goals: Progressing toward goals ? ?  ?Frequency ? ? ? Min 2X/week ? ? ? ?  ?PT Plan Current plan remains appropriate  ? ? ?Co-evaluation   ?  ?  ?  ?  ? ?  ?AM-PAC PT "6 Clicks" Mobility   ?Outcome Measure ? Help needed turning from your back to your side while in a flat bed without using bedrails?: Total ?Help needed moving from lying on your back to sitting on the side of a flat bed without using bedrails?: Total ?Help needed moving to and from a bed to a chair (including a wheelchair)?: Total ?Help needed standing up from a chair using your arms (e.g., wheelchair or bedside chair)?: Total ?Help needed to walk in hospital room?: Total ?Help needed climbing 3-5 steps with a railing? : Total ?6 Click Score: 6 ? ?  ?End of Session Equipment Utilized During Treatment: Oxygen ?Activity Tolerance: Patient limited by pain;Other (comment) (anxious behaviors) ?Patient left: in bed;with call bell/phone within reach;with bed alarm set ?Nurse Communication: Mobility status ?PT Visit Diagnosis: Unsteadiness on feet (R26.81);History of falling (Z91.81);Repeated falls (R29.6);Difficulty in walking, not elsewhere classified (R26.2) ?  ? ? ?Time: 1006-1020 ?PT Time Calculation (min) (ACUTE ONLY): 14 min ? ?Charges:  $Therapeutic Exercise: 8-22 mins          ?          ? ?Law Corsino M,PT ?Acute Rehab Services ?[phone] ?[phone] (pager)  ? ? ?Nakaiya Beddow F Jacky Dross ?04/12/2022, 1:02 PM ? ?

## 2022-04-12 NOTE — TOC Progression Note (Signed)
Transition of Care (TOC) - Progression Note  ? ? ?Patient Details  ?Name: Christian [name] ?MRN: [id number] ?Date of Birth: 1951/02/13 ? ?Transition of Care (TOC) CM/SW Contact  ?Coralee Pesa, LCSWA ?Phone Number: ?04/12/2022, 2:18 PM ? ?Clinical Narrative:    ?CSW spoke with facility who noted they still have not been able to get in contact with pt's payee. They are continuing to work on that today. CSW notified by MD that pt is not medically ready at this time. TOC will continue to follow for DC needs. ? ? ?Expected Discharge Plan: [place] ?Barriers to Discharge: Continued Medical Work up, Ship broker, SNF Pending bed offer ? ?Expected Discharge Plan and Services ?Expected Discharge Plan: [place] ?In-house Referral: Clinical Social Work ?  ?Post Acute Care Choice: [place] ?Living arrangements for the past 2 months: [place], [place] ?                ?  ?  ?  ?  ?  ?  ?  ?  ?  ?  ? ? ?Social Determinants of Health (SDOH) Interventions ?  ? ?Readmission Risk Interventions ? ?  01/28/2022  ? 10:17 AM  ?Readmission Risk Prevention Plan  ?Transportation Screening Complete  ?PCP or Specialist Appt within 5-7 Days Complete  ?Home Care Screening Complete  ?Medication Review (RN CM) Complete  ? ? ?

## 2022-04-12 NOTE — Progress Notes (Signed)
RT spoke to patient about wearing his home CPAP at bedside and patient stated he didn't need any assistance applying the mask.  RT move home CPAP closer to the bedside to be in patient's reach.   ?

## 2022-04-12 NOTE — Consult Note (Signed)
WOC Nurse Consult Note: ?Reason for Consult: Unstageable pressure injury to coccyx and buttocks.  Pressure and moisture etiology, wound was not improved and remains macerated and now portions of wound bed are darkened and devitalized.   ?Wound type:pressure and moisture ?Pressure Injury POA: Yes ?Measurement:6 cm x 6 cm x 0.2 cm with darkened devitalized patches, 0.2 cm and round.  ?Wound [id number] pink, macerated edges.  ?Drainage (amount, consistency, odor) minimal serosanguinous  patient has been febrile and perspiring.  ?Periwound:moist ?Dressing procedure/placement/frequency:Cleanse buttocks/sacral wound with NS and par dry.  Then apply a nickel thick layer of MediHoney directly to the wound or onto a dressing. Make certain that the dressing covers the entire wound base, but not in contact with the peri-wound skin. then cover with silicone foam dressing. Change dressing daily.  ?Please implement low air loss mattress.  This is discussed with bedside RN who is present for assessment.  ? ? ?Will not follow at this time.  Please re-consult if needed.  ?Domenic Moras MSN, RN, FNP-BC CWON ?Wound, Ostomy, Continence Nurse ?Pager [phone]  ? ? ?  ?

## 2022-04-12 NOTE — Progress Notes (Signed)
LUE venous duplex has been completed. ? ? ?Results can be found under chart review under CV PROC. ?04/12/2022 3:33 PM ?Shunda Rabadi RVT, RDMS ? ?

## 2022-04-12 NOTE — Progress Notes (Addendum)
?      ?                 PROGRESS NOTE ? ?      ?PATIENT DETAILS ?Name: Christian [name] ?Age: 71 y.o. ?Sex: male ?Date of Birth: [birth year] ?Admit Date: 03/30/2022 ?Admitting Physician Rise Patience, MD ?PCP:Sun, Gari Crown, MD ? ?Brief Summary: ?Patient is a 71 y.o.  male with a history of A-fib on Xarelto, HFpEF, PAD, DM-2, bipolar disorder-presented with generalized weakness/sacral decubitus ulcer-was found to have hypoglycemia, hyperkalemia and subsequently admitted to the hospitalist service-see below for further details.  Thought to have extrapyramidal symptoms-evaluated by psych/neurology-psych medications adjusted with clinical improvement.  Unfortunately-further hospital course complicated by development of fever-due to probable Streptococcus bacteremia and infected decubitus ulcer.  See below for further details. ? ?Significant events: ?5/2>> admit to [facility] for weakness/hypoglycemia/hyperkalemia/rigidity-thought to have EPS ?5/14>> febrile/tachycardic-Per nursing staff-discharge from prior existing sacral decubitus ulcer.  Started on broad-spectrum IV antibiotics.  Prelim blood culture positive for Streptococcus species. ? ? ?Significant studies: ?5/3>> x-ray pelvis: No fracture. ?5/3>> CT head: No acute intracranial findings. ?5/3>> x-ray left shoulder: Rotator cuff impingement ?5/14>> CXR:No Obvious PNA ? ?Significant microbiology data: ?5/14>> blood culture: 1/2 Gram-positive cocci in chains ? ?Procedures: ? ? ?Consults: ?Neurology, psychiatry ? ?Subjective: ?Arthralgias are better today.  Left arm appears somewhat swollen.  Afebrile this morning-awake/alert. ? ?Objective: ?Vitals: ?Blood pressure 101/76, pulse (!) 104, temperature 99.8 ?F (37.7 ?C), temperature source Oral, resp. rate (!) 21, weight 109.6 kg, SpO2 100 %.  ? ?Exam: ?Gen Exam:Alert awake-not in any distress ?HEENT:atraumatic, normocephalic ?Chest: B/L clear to auscultation anteriorly ?CVS:S1S2 regular ?Abdomen:soft non tender, non  distended ?Extremities:no edema ?Neurology: Non focal ?Skin: no rash  ? ?Pertinent Labs/Radiology: ? ?  Latest Ref Rng & Units 04/12/2022  ?  1:44 AM 04/11/2022  ?  1:04 AM 04/06/2022  ?  4:13 AM  ?CBC  ?WBC 4.0 - 10.5 K/uL 9.8   10.2   5.8    ?Hemoglobin 13.0 - 17.0 g/dL 13.1   13.2   13.1    ?Hematocrit 39.0 - 52.0 % 41.4   41.9   42.1    ?Platelets 150 - 400 K/uL 245   275   222    ?  ?Lab Results  ?Component Value Date  ? NA 134 (L) 04/12/2022  ? K 4.8 04/12/2022  ? CL 104 04/12/2022  ? CO2 25 04/12/2022  ? ?  ? ? 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  Managed with supportive care.  After neurology/psych evaluation-has been resumed on Abilify and Cogentin-Wellbutrin/Seroquel has been discontinued. ? ?AKI on CKD stage IIIa: AKI likely hemodynamically mediated-improved. ? ?Mild rhabdomyolysis: Nontraumatic-improved with supportive care. ? ?Sepsis due to possible strep bacteremia-sacral decubitus ulcer with infection: Appreciate wound care follow-up on 5/15-blood cultures positive for possibly strep species-we will narrow down antibiotics to Rocephin-and continue Flagyl for anaerobic coverage for wound infection.  Repeat cultures today-await final culture/sensitivity results.   ? ?Hyperkalemia: Hold with Lokelma/Demadex-continue.   ? ?Hypoglycemia: Resolved with supportive care.  Do not resume Amaryl on discharge. ? ?DM-2 (A1c 7.6 on 4/15): CBG although better still on the higher side-increase Semglee to 12 units, continue 4 units of NovoLog with meals and SSI.  Avoid tight glycemic control in this frail patient.  ? ?Recent Labs  ?  04/11/22 ?1722 04/11/22 ?2049 04/12/22 ?0919  ?GLUCAP 233* 156* 109*  ? ?  ? ?  Hypothyroidism: Continue Cytomel. ? ?PAF: Rate controlled-continue Xarelto ? ?PAD: Follow-up with vascular surgery in the outpatient setting ? ?HFpEF: Volume status stable.   ? ?Bipolar disorder: Given concerns for EPS-on  Abilify and Cogentin-Depakote/Wellbutrin and Seroquel held. ? ?Left shoulder pain: Supportive care-likely rotator cuff injury.  Stable to be followed up by orthopedics in the outpatient setting ? ?Left arm swelling: Likely due to IVF infiltration-could have superficial venous thrombosis-check Dopplers to rule out DVT. ? ?OSA: CPAP nightly ? ?Debility/deconditioning: Plans are of SNF on discharge. ? ?Nutrition Status: ?Nutrition Problem: Increased nutrient needs ?Etiology: chronic illness, wound healing ?Signs/Symptoms: estimated needs ?Interventions: Ensure Enlive (each supplement provides 350kcal and 20 grams of protein), MVI, Liberalize Diet ? ?Pressure Ulcer: ?Pressure Injury 03/31/22 Sacrum Mid Stage 3 -  Full thickness tissue loss. Subcutaneous fat may be visible but bone, tendon or muscle are NOT exposed. (Active)  ?03/31/22 1933  ?Location: Sacrum  ?Location Orientation: Mid  ?Staging: Stage 3 -  Full thickness tissue loss. Subcutaneous fat may be visible but bone, tendon or muscle are NOT exposed.  ?Wound Description (Comments):   ?Present on Admission: Yes  ?Dressing Type Foam - Lift dressing to assess site every shift;Gauze (Comment);Other (Comment) 04/12/22 0034  ? ?Obesity: ?Estimated body mass index is 32.77 kg/m? as calculated from the following: ?  Height as of 03/12/22: 6' (1.829 m). ?  Weight as of this encounter: 109.6 kg.  ? ?Code status: ?  Code Status: Full Code  ? ?DVT Prophylaxis: ?rivaroxaban (XARELTO) tablet 20 mg   ? ?Family Communication: Daughter-[name]-[phone]-updated on 5/15 ? ? ?Disposition Plan: ?Status is: Inpatient ?Remains inpatient appropriate because: Not stable for discharge-has unfortunately developed sepsis physiology-Gram-positive bacteremia. ?  ?Planned Discharge Destination:Skilled nursing facility ? ? ?Diet: ?Diet Order   ? ?       ?  DIET DYS 3 Room service appropriate? Yes; Fluid consistency: Thin  Diet effective now       ?  ? ?  ?  ? ?  ?   ? ? ?Antimicrobial agents: ?Anti-infectives (From admission, onward)  ? ? Start     Dose/Rate Route Frequency Ordered Stop  ? 04/12/22 0815  cefTRIAXone (ROCEPHIN) 2 g in sodium chloride 0.9 % 100 mL IVPB       ? 2 g ?200 mL/hr over 30 Minutes Intravenous Daily 04/12/22 0717    ? 04/12/22 0600  vancomycin (VANCOREADY) IVPB 1250 mg/250 mL  Status:  Discontinued       ? 1,250 mg ?166.7 mL/hr over 90 Minutes Intravenous Every 24 hours 04/11/22 0521 04/12/22 0717  ? 04/11/22 1145  metroNIDAZOLE (FLAGYL) IVPB 500 mg       ? 500 mg ?100 mL/hr over 60 Minutes Intravenous Every 12 hours 04/11/22 1055    ? 04/11/22 0600  ceFEPIme (MAXIPIME) 2 g in sodium chloride 0.9 % 100 mL IVPB  Status:  Discontinued       ? 2 g ?200 mL/hr over 30 Minutes Intravenous Every 8 hours 04/11/22 0437 04/12/22 0717  ? 04/11/22 0530  vancomycin (VANCOREADY) IVPB 2000 mg/400 mL       ? 2,000 mg ?200 mL/hr over 120 Minutes Intravenous  Once 04/11/22 0437 04/11/22 2008  ? ?  ? ? ? ?MEDICATIONS: ?Scheduled Meds: ? ARIPiprazole  10 mg Oral q morning  ? benztropine  1 mg Oral BID  ? diclofenac Sodium  2 g Topical QID  ? feeding supplement  237 mL Oral BID BM  ? insulin aspart  0-9 Units Subcutaneous TID WC  ? insulin aspart  4 Units Subcutaneous TID WC  ? insulin glargine-yfgn  12 Units Subcutaneous Daily  ? leptospermum manuka honey  1 application. Topical Daily  ? liothyronine  10 mcg Oral Daily  ? multivitamin with minerals  1 tablet Oral Daily  ? rivaroxaban  20 mg Oral Q supper  ? senna-docusate  2 tablet Oral BID  ? sodium zirconium cyclosilicate  10 g Oral BID  ? ?Continuous Infusions: ? sodium chloride 150 mL/hr at 04/12/22 0418  ? cefTRIAXone (ROCEPHIN)  IV 2 g (04/12/22 1003)  ? metronidazole 500 mg (04/11/22 2324)  ? ?PRN Meds:.acetaminophen **OR** acetaminophen, bisacodyl, HYDROcodone-acetaminophen, melatonin ? ? ?I have personally reviewed following labs and imaging studies ? ?LABORATORY DATA: ?CBC: ?Recent Labs  ?Lab 04/06/22 ?0413  04/11/22 ?0104 04/12/22 ?0144  ?WBC 5.8 10.2 9.8  ?HGB 13.1 13.2 13.1  ?HCT 42.1 41.9 41.4  ?MCV 84.4 84.6 85.5  ?PLT 222 275 245  ? ? ? ?Basic Metabolic Panel: ?Recent Labs  ?Lab 04/07/22 ?0108 04/08/22 ?0112 04/09/22 ?0309 05/1

## 2022-04-13 ENCOUNTER — Inpatient Hospital Stay (HOSPITAL_COMMUNITY): Payer: Medicare HMO

## 2022-04-13 DIAGNOSIS — A419 Sepsis, unspecified organism: Secondary | ICD-10-CM | POA: Diagnosis not present

## 2022-04-13 DIAGNOSIS — R6520 Severe sepsis without septic shock: Secondary | ICD-10-CM | POA: Diagnosis not present

## 2022-04-13 DIAGNOSIS — R7881 Bacteremia: Secondary | ICD-10-CM | POA: Diagnosis not present

## 2022-04-13 DIAGNOSIS — E119 Type 2 diabetes mellitus without complications: Secondary | ICD-10-CM | POA: Diagnosis not present

## 2022-04-13 DIAGNOSIS — R748 Abnormal levels of other serum enzymes: Secondary | ICD-10-CM | POA: Diagnosis not present

## 2022-04-13 DIAGNOSIS — E875 Hyperkalemia: Secondary | ICD-10-CM | POA: Diagnosis not present

## 2022-04-13 LAB — COMPREHENSIVE METABOLIC PANEL
ALT: 14 U/L (ref 0–44)
AST: 19 U/L (ref 15–41)
Albumin: 1.8 g/dL — ABNORMAL LOW (ref 3.5–5.0)
Alkaline Phosphatase: 55 U/L (ref 38–126)
Anion gap: 5 (ref 5–15)
BUN: 36 mg/dL — ABNORMAL HIGH (ref 8–23)
CO2: 24 mmol/L (ref 22–32)
Calcium: 9.2 mg/dL (ref 8.9–10.3)
Chloride: 111 mmol/L (ref 98–111)
Creatinine, Ser: 1.4 mg/dL — ABNORMAL HIGH (ref 0.61–1.24)
GFR, Estimated: 55 mL/min — ABNORMAL LOW (ref >60)
Glucose, Bld: 112 mg/dL — ABNORMAL HIGH (ref 70–99)
Potassium: 4.2 mmol/L (ref 3.5–5.1)
Sodium: 140 mmol/L (ref 135–145)
Total Bilirubin: 0.6 mg/dL (ref 0.3–1.2)
Total Protein: 6 g/dL — ABNORMAL LOW (ref 6.5–8.1)

## 2022-04-13 LAB — CBC
HCT: 36.9 % — ABNORMAL LOW (ref 39.0–52.0)
Hemoglobin: 11.6 g/dL — ABNORMAL LOW (ref 13.0–17.0)
MCH: 26.9 pg (ref 26.0–34.0)
MCHC: 31.4 g/dL (ref 30.0–36.0)
MCV: 85.6 fL (ref 80.0–100.0)
Platelets: 239 10*3/uL (ref 150–400)
RBC: 4.31 MIL/uL (ref 4.22–5.81)
RDW: 17.2 % — ABNORMAL HIGH (ref 11.5–15.5)
WBC: 10 10*3/uL (ref 4.0–10.5)
nRBC: 0 % (ref 0.0–0.2)

## 2022-04-13 LAB — ECHOCARDIOGRAM LIMITED
AR max vel: 3.04 cm2
AV Area VTI: 2.86 cm2
AV Area mean vel: 3 cm2
AV Mean grad: 5 mmHg
AV Peak grad: 9 mmHg
Ao pk vel: 1.5 m/s
Calc EF: 40.3 %
S' Lateral: 3.5 cm
Single Plane A2C EF: 50 %
Single Plane A4C EF: 28.9 %
Weight: 3865.99 oz

## 2022-04-13 LAB — GLUCOSE, CAPILLARY
Glucose-Capillary: 104 mg/dL — ABNORMAL HIGH (ref 70–99)
Glucose-Capillary: 142 mg/dL — ABNORMAL HIGH (ref 70–99)
Glucose-Capillary: 129 mg/dL — ABNORMAL HIGH (ref 70–99)
Glucose-Capillary: 229 mg/dL — ABNORMAL HIGH (ref 70–99)

## 2022-04-13 MED ORDER — SODIUM ZIRCONIUM CYCLOSILICATE 10 G PO PACK
10.0000 g | PACK | Freq: Every day | ORAL | Status: DC
  Administered 2022-04-14 – 2022-04-15 (×2): 10 g via ORAL
  Filled 2022-04-13 (×2): qty 1

## 2022-04-13 MED ORDER — IOHEXOL 9 MG/ML PO SOLN
ORAL | Status: AC
  Filled 2022-04-13: qty 1000

## 2022-04-13 MED ORDER — METRONIDAZOLE 500 MG PO TABS
500.0000 mg | ORAL_TABLET | Freq: Two times a day (BID) | ORAL | Status: DC
  Administered 2022-04-13 – 2022-04-14 (×2): 500 mg via ORAL
  Filled 2022-04-13 (×2): qty 1

## 2022-04-13 MED ORDER — PERFLUTREN LIPID MICROSPHERE
1.0000 mL | INTRAVENOUS | Status: AC | PRN
  Administered 2022-04-13: 2 mL via INTRAVENOUS

## 2022-04-13 MED ORDER — INSULIN GLARGINE-YFGN 100 UNIT/ML ~~LOC~~ SOLN
8.0000 [IU] | Freq: Every day | SUBCUTANEOUS | Status: DC
  Administered 2022-04-14 – 2022-04-15 (×2): 8 [IU] via SUBCUTANEOUS
  Filled 2022-04-13 (×2): qty 0.08

## 2022-04-13 MED ORDER — IOHEXOL 300 MG/ML  SOLN
100.0000 mL | Freq: Once | INTRAMUSCULAR | Status: AC | PRN
  Administered 2022-04-13: 100 mL via INTRAVENOUS

## 2022-04-13 NOTE — Progress Notes (Signed)
?   04/13/22 1229  ?Assess: MEWS Score  ?Temp 100.3 ?F (37.9 ?C)  ?BP 138/87  ?Pulse Rate (!) 102  ?ECG Heart Rate (!) 108  ?Resp (!) 34  ?Level of Consciousness Alert  ?SpO2 99 %  ?O2 Device Room Air  ?Assess: MEWS Score  ?MEWS Temp 0  ?MEWS Systolic 0  ?MEWS Pulse 1  ?MEWS RR 2  ?MEWS LOC 0  ?MEWS Score 3  ?MEWS Score Color Yellow  ?Assess: if the MEWS score is Yellow or Red  ?Were vital signs taken at a resting state? Yes  ?Focused Assessment No change from prior assessment  ?Early Detection of Sepsis Score *See Row Information* High  ?MEWS guidelines implemented *See Row Information* Yes  ?Treat  ?MEWS Interventions Escalated (See documentation below)  ?Take Vital Signs  ?Increase Vital Sign Frequency  Yellow: Q 2hr X 2 then Q 4hr X 2, if remains yellow, continue Q 4hrs  ?Escalate  ?MEWS: Escalate Yellow: discuss with charge nurse/RN and consider discussing with provider and RRT  ?Notify: Charge Nurse/RN  ?Name of Charge Nurse/RN Notified Erin RN  ?Date Charge Nurse/RN Notified 04/13/22  ?Time Charge Nurse/RN Notified 1230  ?Notify: Provider  ?Provider Name/Title Dr. Jerral Ralph  ?Date Provider Notified 04/13/22  ?Time Provider Notified 1230  ?Method of Notification Page  ?Notification Reason Change in status  ?Provider response In department  ?Date of Provider Response 04/13/22  ?Time of Provider Response 1235  ?Document  ?Progress note created (see row info) Yes  ? ? ?

## 2022-04-13 NOTE — Progress Notes (Signed)
?      ?                 PROGRESS NOTE ? ?      ?PATIENT DETAILS ?Name: Christian [name] ?Age: 71 y.o. ?Sex: male ?Date of Birth: [birth year] ?Admit Date: 03/30/2022 ?Admitting Physician Rise Patience, MD ?PCP:Sun, Gari Crown, MD ? ?Brief Summary: ?Patient is a 71 y.o.  male with a history of A-fib on Xarelto, HFpEF, PAD, DM-2, bipolar disorder-presented with generalized weakness/sacral decubitus ulcer-was found to have hypoglycemia, hyperkalemia and subsequently admitted to the hospitalist service-see below for further details.  Thought to have extrapyramidal symptoms-evaluated by psych/neurology-psych medications adjusted with clinical improvement.  Unfortunately-further hospital course complicated by development of fever-due to probable Streptococcus bacteremia and infected decubitus ulcer.  See below for further details. ? ?Significant events: ?5/2>> admit to [facility] for weakness/hypoglycemia/hyperkalemia/rigidity-thought to have EPS ?5/14>> febrile/tachycardic-Per nursing staff-discharge from prior existing sacral decubitus ulcer.  Started on broad-spectrum IV antibiotics.  Prelim blood culture positive for Streptococcus species. ? ? ?Significant studies: ?5/3>> x-ray pelvis: No fracture. ?5/3>> CT head: No acute intracranial findings. ?5/3>> x-ray left shoulder: Rotator cuff impingement ?5/14>> CXR:No Obvious PNA ?5/15>> left upper extremity Doppler: No DVT. ? ?Significant microbiology data: ?5/14>> blood culture: Streptococcus constellatus  ?5/15>> blood culture: No growth ? ?Procedures: ? ? ?Consults: ?Neurology, psychiatry, infectious disease ? ?Subjective: ?More awake and alert today.  Answers simple questions appropriately. ? ?Objective: ?Vitals: ?Blood pressure 126/75, pulse 87, temperature (!) 100.5 ?F (38.1 ?C), temperature source Axillary, resp. rate 20, weight 109.6 kg, SpO2 98 %.  ? ?Exam: ?Gen Exam:Alert awake-not in any distress ?HEENT:atraumatic, normocephalic ?Chest: B/L clear to auscultation  anteriorly ?CVS:S1S2 regular ?Abdomen:soft non tender, non distended ?Extremities:no edema ?Neurology: Generalized weakness but nonfocal exam. ?Skin: no rash  ? ?Pertinent Labs/Radiology: ? ?  Latest Ref Rng & Units 04/13/2022  ?  7:30 AM 04/12/2022  ?  1:44 AM 04/11/2022  ?  1:04 AM  ?CBC  ?WBC 4.0 - 10.5 K/uL 10.0   9.8   10.2    ?Hemoglobin 13.0 - 17.0 g/dL 11.6   13.1   13.2    ?Hematocrit 39.0 - 52.0 % 36.9   41.4   41.9    ?Platelets 150 - 400 K/uL 239   245   275    ?  ?Lab Results  ?Component Value Date  ? NA 140 04/13/2022  ? K 4.2 04/13/2022  ? CL 111 04/13/2022  ? CO2 24 04/13/2022  ? ?  ? ? ?Assessment/Plan: ?Rigidity/tremors/mildly elevated CK: Felt to be more of extrapyramidal syndrome rather than NMS at this point.  Managed with supportive care.  After neurology/psych evaluation-has been resumed on Abilify and Cogentin-Wellbutrin/Seroquel has been discontinued. ? ?AKI on CKD stage IIIa: AKI likely hemodynamically mediated-improved. ? ?Mild rhabdomyolysis: Nontraumatic-improved with supportive care. ? ?Sepsis due to possible strep bacteremia-sacral decubitus ulcer with infection: Sepsis physiology gradually improving-repeat blood cultures negative-on Rocephin/Flagyl-ID now following.    ? ?Acute metabolic encephalopathy: Due to sepsis-less lethargic today-more awake and alert-continue supportive care.  ? ?Hyperkalemia: Stable with Lokelma-holding Demadex due to ongoing sepsis physiology. ? ?Hypoglycemia: Resolved with supportive care.  Do not resume Amaryl on discharge. ? ?DM-2 (A1c 7.6 on 4/15): CBG relatively stable-avoiding tight glycemic control-as patient at risk for hypoglycemia-decrease Semglee to 8 units-stop scheduled for units of NovoLog-continue SSI.  Reassess tomorrow.    ? ?Recent Labs  ?  04/12/22 ?1708 04/12/22 ?2102 04/13/22 ?0834  ?GLUCAP 159* 130* 104*  ? ?  ?  Hypothyroidism: Continue Cytomel. ? ?PAF: Rate controlled-continue Xarelto ? ?PAD: Follow-up with vascular surgery in the  outpatient setting ? ?HFpEF: Volume status stable.   ? ?Bipolar disorder: Given concerns for EPS-on Abilify and Cogentin-Depakote/Wellbutrin and Seroquel held. ? ?Left shoulder pain: Supportive care-likely rotator cuff injury.  Stable to be followed up by orthopedics in the outpatient setting ? ?Left arm swelling: Likely due to IVF infiltration-could have superficial venous thrombosis-left upper extremity Doppler negative for DVT.  Supportive local care at this point. ? ?OSA: CPAP nightly ? ?Debility/deconditioning: Plans are of SNF on discharge. ? ?Nutrition Status: ?Nutrition Problem: Increased nutrient needs ?Etiology: chronic illness, wound healing ?Signs/Symptoms: estimated needs ?Interventions: Ensure Enlive (each supplement provides 350kcal and 20 grams of protein), MVI, Liberalize Diet ? ?Pressure Ulcer: ?Pressure Injury 03/31/22 Sacrum Mid Stage 3 -  Full thickness tissue loss. Subcutaneous fat may be visible but bone, tendon or muscle are NOT exposed. (Active)  ?03/31/22 1933  ?Location: Sacrum  ?Location Orientation: Mid  ?Staging: Stage 3 -  Full thickness tissue loss. Subcutaneous fat may be visible but bone, tendon or muscle are NOT exposed.  ?Wound Description (Comments):   ?Present on Admission: Yes  ?Dressing Type Foam - Lift dressing to assess site every shift 04/12/22 2010  ? ?Obesity: ?Estimated body mass index is 32.77 kg/m? as calculated from the following: ?  Height as of 03/12/22: 6' (1.829 m). ?  Weight as of this encounter: 109.6 kg.  ? ?Code status: ?  Code Status: Full Code  ? ?DVT Prophylaxis: ?rivaroxaban (XARELTO) tablet 20 mg   ? ?Family Communication: Daughter-[name]-[phone]-updated on 5/15 ? ? ?Disposition Plan: ?Status is: Inpatient ?Remains inpatient appropriate because: Not stable for discharge-has unfortunately developed sepsis physiology-Gram-positive bacteremia. ?  ?Planned Discharge Destination:Skilled nursing facility ? ? ?Diet: ?Diet Order   ? ?       ?  DIET  - DYS 1 Room service appropriate? Yes; Fluid consistency: Nectar Thick  Diet effective now       ?  ? ?  ?  ? ?  ?  ? ? ?Antimicrobial agents: ?Anti-infectives (From admission, onward)  ? ? Start     Dose/Rate Route Frequency Ordered Stop  ? 04/12/22 0815  cefTRIAXone (ROCEPHIN) 2 g in sodium chloride 0.9 % 100 mL IVPB       ? 2 g ?200 mL/hr over 30 Minutes Intravenous Daily 04/12/22 0717    ? 04/12/22 0600  vancomycin (VANCOREADY) IVPB 1250 mg/250 mL  Status:  Discontinued       ? 1,250 mg ?166.7 mL/hr over 90 Minutes Intravenous Every 24 hours 04/11/22 0521 04/12/22 0717  ? 04/11/22 1145  metroNIDAZOLE (FLAGYL) IVPB 500 mg       ? 500 mg ?100 mL/hr over 60 Minutes Intravenous Every 12 hours 04/11/22 1055    ? 04/11/22 0600  ceFEPIme (MAXIPIME) 2 g in sodium chloride 0.9 % 100 mL IVPB  Status:  Discontinued       ? 2 g ?200 mL/hr over 30 Minutes Intravenous Every 8 hours 04/11/22 0437 04/12/22 0717  ? 04/11/22 0530  vancomycin (VANCOREADY) IVPB 2000 mg/400 mL       ? 2,000 mg ?200 mL/hr over 120 Minutes Intravenous  Once 04/11/22 0437 04/11/22 2008  ? ?  ? ? ? ?MEDICATIONS: ?Scheduled Meds: ? ARIPiprazole  10 mg Oral q morning  ? benztropine  1 mg Oral BID  ? diclofenac Sodium  2 g Topical QID  ? insulin aspart  0-9 Units  Subcutaneous TID WC  ? insulin aspart  4 Units Subcutaneous TID WC  ? insulin glargine-yfgn  12 Units Subcutaneous Daily  ? iohexol      ? leptospermum manuka honey  1 application. Topical Daily  ? liothyronine  10 mcg Oral Daily  ? mouth rinse  15 mL Mouth Rinse BID  ? multivitamin with minerals  1 tablet Oral Daily  ? rivaroxaban  20 mg Oral Q supper  ? senna-docusate  2 tablet Oral BID  ? sodium zirconium cyclosilicate  10 g Oral BID  ? ?Continuous Infusions: ? sodium chloride 75 mL/hr at 04/13/22 0350  ? cefTRIAXone (ROCEPHIN)  IV Stopped (04/13/22 0930)  ? metronidazole 500 mg (04/13/22 1103)  ? ?PRN Meds:.acetaminophen **OR** acetaminophen, bisacodyl, HYDROcodone-acetaminophen,  melatonin ? ? ?I have personally reviewed following labs and imaging studies ? ?LABORATORY DATA: ?CBC: ?Recent Labs  ?Lab 04/11/22 ?0104 04/12/22 ?0144 04/13/22 ?0730  ?WBC 10.2 9.8 10.0  ?HGB 13.2 13.1 11.6*  ?HCT 41.9 41.4 36.

## 2022-04-13 NOTE — Progress Notes (Signed)
Nutrition Follow-up ? ?DOCUMENTATION CODES:  ? ?Not applicable ? ?INTERVENTION:  ? ?Continue Multivitamin w/ minerals daily ?Discontinue Ensure Enlive po BID ?Vital Cuisine Shake TID, each supplement provides 520 kcal and 22 grams of protein ?Meal ordering with assist ? ?NUTRITION DIAGNOSIS:  ? ?Increased nutrient needs related to chronic illness, wound healing as evidenced by estimated needs. - Ongoing ? ?GOAL:  ? ?Patient will meet greater than or equal to 90% of their needs - Ongoing ? ?MONITOR:  ? ?PO intake, Supplement acceptance, Labs, Skin ? ?REASON FOR ASSESSMENT:  ? ?Consult ?Assessment of nutrition requirement/status ? ?ASSESSMENT:  ? ?71 y.o. male presented to the ED with generalized weakness. Pt recently discharged from rehab after hospital admission due to hypoglycemia cellulitis. PMH includes CHF, CKD III, HTN, and T2DM. Pt admitted with hypoglycemia, hyperkalemia, and generalized weakness.  ? ?5/11 - diet downgraded to Dysphagia 1, Nectar Thick Liquids ?5/15 - NPO ?5/16 - diet advanced to Dysphagia 1, Nectar Thick Liquids ? ?Pt was medically stable for discharge and then developed sepsis 2/2 sacral wound. ? ?Pt resting in bed, mumbling/groaning. Pt only answered that he was not doing good. Pt was not conversant with RD. RD asked pt if he needed anything and pt shook head no.  ?Per EMR, pt PO intake includes: ?5/11: Breakfast 100% ?5/12: Breakfast 50% ? ?RD to order pt ONS appropriate for current diet order.  ? ?Per WOC note on 5/15, pt now with an unstageable wound to coccyx and buttocks. ? ?Medications reviewed and include: Dulcolax, SSI 0-9 units TID + 4 units TID, Semglee, MVI, Miralax, Senokot, Lokelma, IV antibiotics ?Labs reviewed: BUN 36, Creatinine 1.40, 24 hr CBG 104-159 ? ?Diet Order:   ?Diet Order   ? ?       ?  DIET - DYS 1 Room service appropriate? Yes; Fluid consistency: Nectar Thick  Diet effective now       ?  ? ?  ?  ? ?  ? ?EDUCATION NEEDS:  ? ?No education needs have been identified  at this time ? ?Skin:  Skin Assessment: Skin Integrity Issues: ?Skin Integrity Issues:: Unstageable ?Stage III: Sacrum ?Unstageable: Coccyx & Buttocks ? ?Last BM:  5/15 ? ?Height:  ?Ht Readings from Last 1 Encounters:  ?03/12/22 6' (1.829 m)  ? ?Weight:  ?Wt Readings from Last 1 Encounters:  ?04/02/22 109.6 kg  ? ?Ideal Body Weight:  80.9 kg ? ?BMI:  Body mass index is 32.77 kg/m?. ? ?Estimated Nutritional Needs:  ?Kcal:  2100-2300 ?Protein:  105-120 grams ?Fluid:  >/= 2.1 L ? ? ?Kirby Crigler RD, LDN ?Clinical Dietitian ?See [facility] for contact information.  ?

## 2022-04-13 NOTE — Progress Notes (Signed)
PT Cancellation Note ? ?Patient Details ?Name: Christian [name] ?MRN: [id number] ?DOB: Apr 03, 1951 ? ? ?Cancelled Treatment:    Reason Eval/Treat Not Completed: Patient at procedure or test/unavailable. RN reports pt with increased lethargy and was found to have a new bacteremia and was leaving for CT scan. RR at rest noted to be 41 and HR at 100 bpm. Acute PT to return as able to progress mobility as appropriate. ? ?Lewis Shock, PT, DPT ?Acute Rehabilitation Services ?Secure chat preferred ?Office #: [phone] ? ? ? ?Christian [name] ?04/13/2022, 1:04 PM ?

## 2022-04-13 NOTE — Plan of Care (Signed)
?  Problem: Clinical Measurements: ?Goal: Will remain free from infection ?Outcome: Progressing ?Goal: Respiratory complications will improve ?Outcome: Progressing ?  ?Problem: Clinical Measurements: ?Goal: Respiratory complications will improve ?Outcome: Progressing ?  ?Problem: Pain Managment: ?Goal: General experience of comfort will improve ?Outcome: Progressing ?  ?

## 2022-04-13 NOTE — Evaluation (Signed)
Clinical/Bedside Swallow Evaluation ?Patient Details  ?Name: Christian [name] ?MRN: [id number] ?Date of Birth: Sep 13, 1951 ? ?Today's Date: 04/13/2022 ?Time: SLP Start Time (ACUTE ONLY): 0920 SLP Stop Time (ACUTE ONLY): 0953 ?SLP Time Calculation (min) (ACUTE ONLY): 33 min ? ?Past Medical History:  ?Past Medical History:  ?Diagnosis Date  ? Bilateral lower extremity edema: chronic with venous stasis changes 06/02/2014  ? Bipolar 1 disorder (HCC)   ? Bipolar disorder (HCC)   ? CHF (congestive heart failure) (HCC)   ? Chronic kidney disease   ? Gout   ? Gout   ? Hx of blood clots   ? Hypertension   ? Mood swings   ? OSA on CPAP 06/02/2014  ? Other and unspecified hyperlipidemia 06/02/2014  ? Type II or unspecified type diabetes mellitus with unspecified complication, uncontrolled 06/02/2014  ? ?Past Surgical History:  ?Past Surgical History:  ?Procedure Laterality Date  ? ABDOMINAL AORTOGRAM W/LOWER EXTREMITY N/A 09/16/2021  ? Procedure: ABDOMINAL AORTOGRAM W/LOWER EXTREMITY;  Surgeon: Victorino Sparrow, MD;  Location: [facility] INVASIVE CV LAB;  Service: Cardiovascular;  Laterality: N/A;  ? LEFT HEART CATH AND CORONARY ANGIOGRAPHY Left 04/01/2020  ? Procedure: LEFT HEART CATH AND CORONARY ANGIOGRAPHY;  Surgeon: Laurier Nancy, MD;  Location: [facility] INVASIVE CV LAB;  Service: Cardiovascular;  Laterality: Left;  ? PERIPHERAL VASCULAR BALLOON ANGIOPLASTY Left 09/16/2021  ? Procedure: PERIPHERAL VASCULAR BALLOON ANGIOPLASTY;  Surgeon: Victorino Sparrow, MD;  Location: [facility] INVASIVE CV LAB;  Service: Cardiovascular;  Laterality: Left;  attempted to PTA posterior tibial artery; unable to cross lesion  ? ?HPI:  ?71 y.o. male presented 5/2 with worsening strength , new decubitus ulcers and poor intake.  Recent admission for cellulitis and hypoglycemia, had just returned to ALF from SNF a week ago but not OOB since then. CXR shows Low lung volumes with mild atelectasis at the right lung base. No  acute infiltrate is identified..  ?  ?Assessment / Plan  / Recommendation  ?Clinical Impression ? Pt presents with appearance of delirium, barely opens eyes but will accept PO. He resists repositioning and cries out in pain. He does ask for water and consumed 16 oz of water with one instance of cough, though SLP had positioned him very upright. When laying back pt was able to take nectar thick liquids without coughing. Mastication impaired currently due to mentation. Will modify diet to puree and nectar until mentation and awareness improve. Pills can be given whole in puree if pt is not pocketing. ?SLP Visit Diagnosis: Dysphagia, unspecified (R13.10) ?   ?Aspiration Risk ? Moderate aspiration risk;Risk for inadequate nutrition/hydration  ?  ?Diet Recommendation Dysphagia 1 (Puree);Nectar-thick liquid  ? ?Liquid Administration via: Cup;Straw ?Medication Administration: Whole meds with puree ?Supervision: Full supervision/cueing for compensatory strategies ?Compensations: Slow rate;Small sips/bites ?Postural Changes: Seated upright at 90 degrees  ?  ?Other  Recommendations Oral Care Recommendations: Oral care BID   ? ?Recommendations for follow up therapy are one component of a multi-disciplinary discharge planning process, led by the attending physician.  Recommendations may be updated based on patient status, additional functional criteria and insurance authorization. ? ?Follow up Recommendations Skilled nursing-short term rehab (<3 hours/day)  ? ? ?  ?Assistance Recommended at Discharge    ?Functional Status Assessment    ?Frequency and Duration min 2x/week  ?2 weeks ?  ?   ? ?Prognosis    ? ?  ? ?Swallow Study   ?General HPI: 71 y.o. male presented 5/2 with worsening strength , new  decubitus ulcers and poor intake.  Recent admission for cellulitis and hypoglycemia, had just returned to ALF from SNF a week ago but not OOB since then. CXR shows Low lung volumes with mild atelectasis at the right lung base. No  acute infiltrate is identified.Marland Kitchen ?Type of Study: Bedside  Swallow Evaluation ?Previous Swallow Assessment: BSE in 2021 - WNL ?Diet Prior to this Study: NPO ?Temperature Spikes Noted: Yes ?Respiratory Status: Room air ?History of Recent Intubation: No ?Behavior/Cognition: Lethargic/Drowsy;Requires cueing;Confused ?Oral Cavity Assessment: Dry ?Oral Care Completed by SLP: No ?Oral Cavity - Dentition: Adequate natural dentition ?Vision: Impaired for self-feeding ?Self-Feeding Abilities: Total assist ?Patient Positioning: Upright in bed ?Baseline Vocal Quality: Normal ?Volitional Cough: Cognitively unable to elicit ?Volitional Swallow: Unable to elicit  ?  ?Oral/Motor/Sensory Function Overall Oral Motor/Sensory Function: Within functional limits   ?Ice Chips Ice chips: Not tested   ?Thin Liquid Thin Liquid: Impaired ?Presentation: Straw ?Pharyngeal  Phase Impairments: Cough - Immediate  ?  ?Nectar Thick Nectar Thick Liquid: Within functional limits   ?Honey Thick Honey Thick Liquid: Not tested   ?Puree Puree: Within functional limits ?Presentation: Spoon   ?Solid ? ? ?  Solid: Impaired ?Oral Phase Impairments: Impaired mastication;Poor awareness of bolus ?Oral Phase Functional Implications: Impaired mastication;Prolonged oral transit  ? ?  ? ?[name], Riley Nearing ?04/13/2022,9:54 AM ? ? ? ?

## 2022-04-13 NOTE — Consult Note (Signed)
?   ? ? ? ? ?[facility]   ? ?Date of Admission:  03/30/2022    ? ?Reason for Consult: strep constellatus bacteremia, sacral ulcer    ?Referring Provider: Gerrit Heck shanker ? ? ?Abx: ?5/15-c ceftriaxone ?5/14-c metronidazole ? ?5/14 vanc, cefepime      ? ? ?Assessment: ?71 y.o. male snf resident, pad, bipolar, HFpEF, ckd, dm2, osa, afib admitted from snf for failure to thrive, with sacral decub ulcer present on admission, course complicated by [facility] with strep constellatus in setting of increased disharge from sacral ulcer ? ?Hob strep constellatus ?Sacral decub ?-question of increasing sacral decub discharge ?-only has peripheral iv's and on admission LUE swelling but no sign of insertion site cellulitis ?-strep constellatus known to cause metastatic abscess and also high burden concerning for seeding heart valve ?-source GI vs decub vs skin ? ?Will need ct abd/pelv to evaluate sacral/pelvic abscess abd abscess ?Will get limited tte to evaluate for valve involvement ?5/15 repeat bcx in progress ? ?Pending further study unclear duration of tx at this time ? ?Plan: ?Ct abd pelv with contrast ?Tte ?F/u repeat bcx ?Agree continue ceftriaxone and metronidazole for now ?Discussed with primary team  ? ? ? ? ?------------------------------------------------ ?Principal Problem: ?  Hyperkalemia ?Active Problems: ?  Diabetes mellitus type 2 in nonobese [facility]) ?  Sacral decubitus ulcer, stage II ([place]) ?  Hypoglycemia ? ? ? ?HPI: Christian [name] is a 71 y.o. male snf resident, pad, bipolar, HFpEF, ckd, dm2, osa, afib admitted from snf for failure to thrive, with sacral decub ulcer present on admission, course complicated by [facility] with strep constellatus in setting of increased disharge from sacral ulcer ? ?He initially was brought in on 03/30/22 with hypoglycemia (cpg 30s, aki cr 1.9 and hyperkalemia) after reported unable to do physical therapy at snf with associated generalized weakness ? ?He has  been pretty much bed bound in his snf and during this admission ? ?There is some concern if his physical deconditioning is complicated by extrapyramidal sx (psychiatric evaluation here this admission for initial presenting rgidity) ? ?On 5/14 he developed sepsis/fever and somnolence ?No leukocytosis ?Cxr was clear ?He had left arm swelling and dvt w/u negative ?Bcx returned with strep constellatus ?Report of sacral ulcer increasing discharge ?Started on bsAbx but narrowed to ceftriaxone and metronidazole when strep constellatus returns on culture ?5/15 repeat bcx ngtd ? ?Today 04/13/22 he report no focal discomfort. No cough, chest pain, rash, n/v/diarrhea, headache, focal numbness/weakness ? ? ?Chart note improving mentation today ?His initial AKI also had improved ? ? ? ? ? ? ?Family History  ?Problem Relation Age of Onset  ? Dementia Mother   ? Arthritis Father   ? Hypertension Father   ? Mental illness Sister   ? Diabetes Sister   ? Heart failure Neg Hx   ? Kidney failure Neg Hx   ? Cancer Neg Hx   ? ? ?Social History  ? ?Tobacco Use  ? Smoking status: Never  ? Smokeless tobacco: Never  ?Vaping Use  ? Vaping Use: Never used  ?Substance Use Topics  ? Alcohol use: No  ? Drug use: No  ? ? ?Allergies  ?Allergen Reactions  ? Amlodipine Swelling and Other (See Comments)  ?  Legs became swollen  ? Onglyza [Saxagliptin] Other (See Comments)  ?  "Allergic," per [place]  ? Zyprexa [Olanzapine] Other (See Comments)  ?  Allergy noted by ACT Team. No reaction specified (??)- "Allergic,"  per [place]  ? Ritalin [Methylphenidate] Anxiety and Other (See Comments)  ?  "[place]"  ? ? ?Review of Systems: ?ROS ?All Other ROS was negative, except mentioned above ? ? ?Past Medical History:  ?Diagnosis Date  ? Bilateral lower extremity edema: chronic with venous stasis changes 06/02/2014  ? Bipolar 1 disorder ([place])   ? Bipolar disorder ([place])   ? CHF (congestive heart failure) ([place])   ? Chronic kidney disease   ?  Gout   ? Gout   ? Hx of blood clots   ? Hypertension   ? Mood swings   ? OSA on CPAP 06/02/2014  ? Other and unspecified hyperlipidemia 06/02/2014  ? Type II or unspecified type diabetes mellitus with unspecified complication, uncontrolled 06/02/2014  ? ? ? ? ? ?Scheduled Meds: ? ARIPiprazole  10 mg Oral q morning  ? benztropine  1 mg Oral BID  ? diclofenac Sodium  2 g Topical QID  ? insulin aspart  0-9 Units Subcutaneous TID WC  ? [START ON 04/14/2022] insulin glargine-yfgn  8 Units Subcutaneous Daily  ? iohexol      ? leptospermum manuka honey  1 application. Topical Daily  ? liothyronine  10 mcg Oral Daily  ? mouth rinse  15 mL Mouth Rinse BID  ? metroNIDAZOLE  500 mg Oral Q12H  ? multivitamin with minerals  1 tablet Oral Daily  ? rivaroxaban  20 mg Oral Q supper  ? senna-docusate  2 tablet Oral BID  ? [START ON 04/14/2022] sodium zirconium cyclosilicate  10 g Oral Daily  ? ?Continuous Infusions: ? sodium chloride 75 mL/hr at 04/13/22 1235  ? cefTRIAXone (ROCEPHIN)  IV Stopped (04/13/22 0930)  ? ?PRN Meds:.acetaminophen **OR** acetaminophen, bisacodyl, HYDROcodone-acetaminophen, melatonin ? ? ?OBJECTIVE: ?Blood pressure (!) 141/90, pulse (!) 108, temperature 98.4 ?F (36.9 ?C), temperature source Oral, resp. rate (!) 30, weight 109.6 kg, SpO2 99 %. ? ?Physical Exam ? ?General/constitutional: no distress, conversant, appropriate, slightly letheragic/somonlent. Chronically ill appearing ?HEENT: Normocephalic, PER, Conj Clear, EOMI, Oropharynx clear ?Neck supple ?CV: rrr no mrg ?Lungs: clear to auscultation, normal respiratory effort ?Abd: Soft, Nontender ?Ext: no LE edema; bilateral feet in floating boot ?Skin: No Rash -- fungal nail changes in toe nails ?Neuro: no tremor; generalized weakness; strength 4/5 symmetric ?MSK: no peripheral joint swelling/tenderness/warmth; back spines nontender ? ? ? ? ?Lab Results ?Lab Results  ?Component Value Date  ? WBC 10.0 04/13/2022  ? HGB 11.6 (L) 04/13/2022  ? HCT 36.9 (L) 04/13/2022   ? MCV 85.6 04/13/2022  ? PLT 239 04/13/2022  ?  ?Lab Results  ?Component Value Date  ? CREATININE 1.40 (H) 04/13/2022  ? BUN 36 (H) 04/13/2022  ? NA 140 04/13/2022  ? K 4.2 04/13/2022  ? CL 111 04/13/2022  ? CO2 24 04/13/2022  ?  ?Lab Results  ?Component Value Date  ? ALT 14 04/13/2022  ? AST 19 04/13/2022  ? ALKPHOS 55 04/13/2022  ? BILITOT 0.6 04/13/2022  ?  ? ? ?Microbiology: ?Recent Results (from the past 240 hour(s))  ?Culture, blood (Routine X 2) w Reflex to ID Panel     Status: Abnormal (Preliminary result)  ? Collection Time: 04/11/22  4:22 AM  ? Specimen: BLOOD RIGHT HAND  ?Result Value Ref Range Status  ? Specimen Description BLOOD RIGHT HAND  Final  ? Special Requests   Final  ?  BOTTLES DRAWN AEROBIC ONLY Blood Culture adequate volume  ? Culture  Setup Time   Final  ?  GRAM POSITIVE COCCI IN CHAINS ?AEROBIC BOTTLE ONLY ?CRITICAL RESULT CALLED TO, READ BACK BY AND VERIFIED WITH: T RUDISILL,PHARMD@[id number]  04/12/22 [place] ?  ? Culture (A)  Final  ?  STREPTOCOCCUS CONSTELLATUS ?SUSCEPTIBILITIES TO FOLLOW ?Performed at [facility], [place] [address], [place], [place] [address] ?  ? Report Status PENDING  Incomplete  ?Blood Culture ID Panel (Reflexed)     Status: Abnormal  ? Collection Time: 04/11/22  4:22 AM  ?Result Value Ref Range Status  ? Enterococcus faecalis NOT DETECTED NOT DETECTED Final  ? Enterococcus Faecium NOT DETECTED NOT DETECTED Final  ? Listeria monocytogenes NOT DETECTED NOT DETECTED Final  ? Staphylococcus species NOT DETECTED NOT DETECTED Final  ? Staphylococcus aureus (BCID) NOT DETECTED NOT DETECTED Final  ? Staphylococcus epidermidis NOT DETECTED NOT DETECTED Final  ? Staphylococcus lugdunensis NOT DETECTED NOT DETECTED Final  ? Streptococcus species DETECTED (A) NOT DETECTED Final  ?  Comment: Not Enterococcus species, Streptococcus agalactiae, Streptococcus pyogenes, or Streptococcus pneumoniae. ?CRITICAL RESULT CALLED TO, READ BACK BY AND VERIFIED WITH: ?T RUDISILL,PHARMD@[id number]  04/12/22  [place] ?  ? Streptococcus agalactiae NOT DETECTED NOT DETECTED Final  ? Streptococcus pneumoniae NOT DETECTED NOT DETECTED Final  ? Streptococcus pyogenes NOT DETECTED NOT DETECTED Final  ? A.calcoaceticus

## 2022-04-14 DIAGNOSIS — E875 Hyperkalemia: Secondary | ICD-10-CM | POA: Diagnosis not present

## 2022-04-14 DIAGNOSIS — R6520 Severe sepsis without septic shock: Secondary | ICD-10-CM | POA: Diagnosis not present

## 2022-04-14 DIAGNOSIS — R7881 Bacteremia: Secondary | ICD-10-CM | POA: Diagnosis not present

## 2022-04-14 DIAGNOSIS — A419 Sepsis, unspecified organism: Secondary | ICD-10-CM | POA: Diagnosis not present

## 2022-04-14 DIAGNOSIS — L89152 Pressure ulcer of sacral region, stage 2: Secondary | ICD-10-CM | POA: Diagnosis not present

## 2022-04-14 DIAGNOSIS — R748 Abnormal levels of other serum enzymes: Secondary | ICD-10-CM | POA: Diagnosis not present

## 2022-04-14 DIAGNOSIS — E119 Type 2 diabetes mellitus without complications: Secondary | ICD-10-CM | POA: Diagnosis not present

## 2022-04-14 LAB — CBC
HCT: 41.7 % (ref 39.0–52.0)
Hemoglobin: 13 g/dL (ref 13.0–17.0)
MCH: 26.8 pg (ref 26.0–34.0)
MCHC: 31.2 g/dL (ref 30.0–36.0)
MCV: 86 fL (ref 80.0–100.0)
Platelets: 251 10*3/uL (ref 150–400)
RBC: 4.85 MIL/uL (ref 4.22–5.81)
RDW: 17.6 % — ABNORMAL HIGH (ref 11.5–15.5)
WBC: 10 10*3/uL (ref 4.0–10.5)
nRBC: 0 % (ref 0.0–0.2)

## 2022-04-14 LAB — BASIC METABOLIC PANEL
Anion gap: 7 (ref 5–15)
BUN: 34 mg/dL — ABNORMAL HIGH (ref 8–23)
CO2: 22 mmol/L (ref 22–32)
Calcium: 9.2 mg/dL (ref 8.9–10.3)
Chloride: 110 mmol/L (ref 98–111)
Creatinine, Ser: 1.21 mg/dL (ref 0.61–1.24)
GFR, Estimated: >60 mL/min (ref >60)
Glucose, Bld: 156 mg/dL — ABNORMAL HIGH (ref 70–99)
Potassium: 4.3 mmol/L (ref 3.5–5.1)
Sodium: 139 mmol/L (ref 135–145)

## 2022-04-14 LAB — CULTURE, BLOOD (ROUTINE X 2)
Special Requests: ADEQUATE
Special Requests: ADEQUATE

## 2022-04-14 LAB — GLUCOSE, CAPILLARY
Glucose-Capillary: 166 mg/dL — ABNORMAL HIGH (ref 70–99)
Glucose-Capillary: 156 mg/dL — ABNORMAL HIGH (ref 70–99)
Glucose-Capillary: 264 mg/dL — ABNORMAL HIGH (ref 70–99)
Glucose-Capillary: 234 mg/dL — ABNORMAL HIGH (ref 70–99)

## 2022-04-14 LAB — MAGNESIUM: Magnesium: 2.1 mg/dL (ref 1.7–2.4)

## 2022-04-14 MED ORDER — VANCOMYCIN HCL 1500 MG/300ML IV SOLN: 1500.0000 mg | INTRAVENOUS | Status: DC

## 2022-04-14 MED ORDER — DAPTOMYCIN 500 MG IV SOLR
8.0000 mg/kg | Freq: Every day | INTRAVENOUS | Status: DC
Start: 2022-04-14 — End: 2022-04-22
  Administered 2022-04-14 – 2022-04-21 (×8): 700 mg via INTRAVENOUS
  Filled 2022-04-14 (×10): qty 14

## 2022-04-14 MED ORDER — AMOXICILLIN-POT CLAVULANATE 875-125 MG PO TABS
1.0000 | ORAL_TABLET | Freq: Two times a day (BID) | ORAL | Status: AC
  Administered 2022-04-14 – 2022-04-23 (×19): 1 via ORAL
  Filled 2022-04-14 (×19): qty 1

## 2022-04-14 MED ORDER — VANCOMYCIN HCL 2000 MG/400ML IV SOLN
2000.0000 mg | Freq: Once | INTRAVENOUS | Status: DC
  Filled 2022-04-14: qty 400

## 2022-04-14 MED ORDER — IBUPROFEN 600 MG PO TABS
600.0000 mg | ORAL_TABLET | Freq: Once | ORAL | Status: AC
  Administered 2022-04-15: 600 mg via ORAL
  Filled 2022-04-14: qty 1

## 2022-04-14 NOTE — Progress Notes (Signed)
?      ?                 PROGRESS NOTE ? ?      ?PATIENT DETAILS ?Name: Christian [name] ?Age: 71 y.o. ?Sex: male ?Date of Birth: [birth year] ?Admit Date: 03/30/2022 ?Admitting Physician Rise Patience, MD ?PCP:Sun, Gari Crown, MD ? ?Brief Summary: ?Patient is a 71 y.o.  male with a history of A-fib on Xarelto, HFpEF, PAD, DM-2, bipolar disorder-presented with generalized weakness/sacral decubitus ulcer-was found to have hypoglycemia, hyperkalemia and subsequently admitted to the hospitalist service-see below for further details.  Thought to have extrapyramidal symptoms-evaluated by psych/neurology-psych medications adjusted with clinical improvement.  Unfortunately-further hospital course complicated by development of fever-due to probable Streptococcus bacteremia and infected decubitus ulcer.  See below for further details. ? ?Significant events: ?5/2>> admit to [facility] for weakness/hypoglycemia/hyperkalemia/rigidity-thought to have EPS ?5/14>> febrile/tachycardic-Per nursing staff-discharge from prior existing sacral decubitus ulcer.  Started on broad-spectrum IV antibiotics.  Prelim blood culture positive for Streptococcus species. ? ? ?Significant studies: ?5/3>> x-ray pelvis: No fracture. ?5/3>> CT head: No acute intracranial findings. ?5/3>> x-ray left shoulder: Rotator cuff impingement ?5/14>> CXR:No Obvious PNA ?5/15>> left upper extremity Doppler: No DVT. ?5/16>> CT abdomen/pelvis: Inferior sacral decubitus ulceration with ill-defined subcutaneous fluid/soft tissue emphysema consistent with soft tissue infection-no evidence of osteomyelitis. ?5/16>> Echo: EF 45-50%, multiple wall motion abnormalities-unchanged from prior.  No obvious vegetation. ? ?Significant microbiology data: ?5/14>> blood culture: Streptococcus constellatus  ?5/15>> blood culture: No growth ? ?Procedures: ? ? ?Consults: ?Neurology, psychiatry, infectious disease ? ?Subjective: ?Febrile this morning-awake/alert.  Answers questions  appropriately. ? ?Objective: ?Vitals: ?Blood pressure 107/69, pulse (!) 101, temperature (!) 100.6 ?F (38.1 ?C), temperature source Axillary, resp. rate (!) 23, weight 109.6 kg, SpO2 98 %.  ? ?Exam: ?Gen Exam:Alert awake-not in any distress ?HEENT:atraumatic, normocephalic ?Chest: B/L clear to auscultation anteriorly ?CVS:S1S2 regular ?Abdomen:soft non tender, non distended ?Extremities:no edema ?Neurology: Generalized weakness-but moves all 4 extremities. ?Skin: no rash  ? ?Pertinent Labs/Radiology: ? ?  Latest Ref Rng & Units 04/14/2022  ?  1:17 AM 04/13/2022  ?  7:30 AM 04/12/2022  ?  1:44 AM  ?CBC  ?WBC 4.0 - 10.5 K/uL 10.0   10.0   9.8    ?Hemoglobin 13.0 - 17.0 g/dL 13.0   11.6   13.1    ?Hematocrit 39.0 - 52.0 % 41.7   36.9   41.4    ?Platelets 150 - 400 K/uL 251   239   245    ?  ?Lab Results  ?Component Value Date  ? NA 139 04/14/2022  ? K 4.3 04/14/2022  ? CL 110 04/14/2022  ? CO2 22 04/14/2022  ? ?  ? ? ?Assessment/Plan: ?Rigidity/tremors/mildly elevated CK: Felt to be more of extrapyramidal syndrome rather than NMS at this point.  Managed with supportive care.  After neurology/psych evaluation-has been resumed on Abilify and Cogentin-Wellbutrin/Seroquel has been discontinued. ? ?AKI on CKD stage IIIa: AKI likely hemodynamically mediated-improved. ? ?Mild rhabdomyolysis: Nontraumatic-improved with supportive care. ? ?Sepsis due to possible strep bacteremia-sacral decubitus ulcer with infection: Sepsis physiology gradually improving-still febrile today-sensitivity showing intermediate to Rocephin-ID following-plans are to switch to daptomycin for a few days stable patient defervesces.  Transthoracic echo without any obvious vegetations.  CT abdomen/pelvis shows soft tissue infection with but without osteomyelitis. ? ?Acute metabolic encephalopathy: Due to sepsis-more awake today compared to the past few days.  Continue supportive care.   ? ?Hyperkalemia: Stable with Lokelma-holding Demadex due  to ongoing  sepsis physiology. ? ?Hypoglycemia: Resolved with supportive care.  Do not resume Amaryl on discharge. ? ?DM-2 (A1c 7.6 on 4/15): CBG relatively stable-avoiding tight glycemic control-we will get Semglee 8 units today-SSI-and we will reassess on 5/18 ? ?Recent Labs  ?  04/13/22 ?1631 04/13/22 ?2030 04/14/22 ?0735  ?GLUCAP 129* 229* 166*  ? ?  ?Hypothyroidism: Continue Cytomel. ? ?PAF: Rate controlled-continue Xarelto ? ?PAD: Follow-up with vascular surgery in the outpatient setting ? ?HFpEF: Volume status stable.   ? ?Bipolar disorder: Given concerns for EPS-on Abilify and Cogentin-Depakote/Wellbutrin and Seroquel held. ? ?Left shoulder pain: Supportive care-likely rotator cuff injury.  Stable to be followed up by orthopedics in the outpatient setting ? ?Left arm swelling: Likely due to IVF infiltration-could have superficial venous thrombosis-left upper extremity Doppler negative for DVT.  Supportive local care at this point. ? ?OSA: CPAP nightly ? ?Debility/deconditioning: Plans are of SNF on discharge. ? ?Nutrition Status: ?Nutrition Problem: Increased nutrient needs ?Etiology: chronic illness, wound healing ?Signs/Symptoms: estimated needs ?Interventions: MVI, Hormel Shake ? ?Pressure Ulcer: ?Pressure Injury 03/31/22 Sacrum Mid Stage 3 -  Full thickness tissue loss. Subcutaneous fat may be visible but bone, tendon or muscle are NOT exposed. (Active)  ?03/31/22 1933  ?Location: Sacrum  ?Location Orientation: Mid  ?Staging: Stage 3 -  Full thickness tissue loss. Subcutaneous fat may be visible but bone, tendon or muscle are NOT exposed.  ?Wound Description (Comments):   ?Present on Admission: Yes  ?Dressing Type Foam - Lift dressing to assess site every shift 04/13/22 2026  ? ?Obesity: ?Estimated body mass index is 32.77 kg/m? as calculated from the following: ?  Height as of 03/12/22: 6' (1.829 m). ?  Weight as of this encounter: 109.6 kg.  ? ?Code status: ?  Code Status: Full Code  ? ?DVT  Prophylaxis: ?rivaroxaban (XARELTO) tablet 20 mg   ? ?Family Communication: Daughter-[name]-[phone]-called on 5/17-no response. ? ? ?Disposition Plan: ?Status is: Inpatient ?Remains inpatient appropriate because: Still febrile-noted stable for discharge. ?  ?Planned Discharge Destination:Skilled nursing facility ? ? ?Diet: ?Diet Order   ? ?       ?  DIET - DYS 1 Room service appropriate? Yes; Fluid consistency: Nectar Thick  Diet effective now       ?  ? ?  ?  ? ?  ?  ? ? ?Antimicrobial agents: ?Anti-infectives (From admission, onward)  ? ? Start     Dose/Rate Route Frequency Ordered Stop  ? 04/15/22 0900  vancomycin (VANCOREADY) IVPB 1500 mg/300 mL  Status:  Discontinued       ? 1,500 mg ?150 mL/hr over 120 Minutes Intravenous Every 24 hours 04/14/22 0811 04/14/22 1005  ? 04/14/22 1100  DAPTOmycin (CUBICIN) 700 mg in sodium chloride 0.9 % IVPB       ? 8 mg/kg ? 90.4 kg (Adjusted) ?128 mL/hr over 30 Minutes Intravenous Daily 04/14/22 1005    ? 04/14/22 0900  vancomycin (VANCOREADY) IVPB 2000 mg/400 mL  Status:  Discontinued       ? 2,000 mg ?200 mL/hr over 120 Minutes Intravenous  Once 04/14/22 0811 04/14/22 1005  ? 04/13/22 2200  metroNIDAZOLE (FLAGYL) tablet 500 mg       ? 500 mg Oral Every 12 hours 04/13/22 1436    ? 04/12/22 0815  cefTRIAXone (ROCEPHIN) 2 g in sodium chloride 0.9 % 100 mL IVPB  Status:  Discontinued       ? 2 g ?200 mL/hr over 30 Minutes Intravenous Daily  04/12/22 0717 04/14/22 0811  ? 04/12/22 0600  vancomycin (VANCOREADY) IVPB 1250 mg/250 mL  Status:  Discontinued       ? 1,250 mg ?166.7 mL/hr over 90 Minutes Intravenous Every 24 hours 04/11/22 0521 04/12/22 0717  ? 04/11/22 1145  metroNIDAZOLE (FLAGYL) IVPB 500 mg  Status:  Discontinued       ? 500 mg ?100 mL/hr over 60 Minutes Intravenous Every 12 hours 04/11/22 1055 04/13/22 1436  ? 04/11/22 0600  ceFEPIme (MAXIPIME) 2 g in sodium chloride 0.9 % 100 mL IVPB  Status:  Discontinued       ? 2 g ?200 mL/hr over 30 Minutes  Intravenous Every 8 hours 04/11/22 0437 04/12/22 0717  ? 04/11/22 0530  vancomycin (VANCOREADY) IVPB 2000 mg/400 mL       ? 2,000 mg ?200 mL/hr over 120 Minutes Intravenous  Once 04/11/22 0437 04/11/22 2008  ? ?  ? ? ? ?MEDICATIONS: ?Scheduled Me

## 2022-04-14 NOTE — Progress Notes (Signed)
Speech Language Pathology Treatment: Dysphagia  ?Patient Details ?Name: Christian [name] ?MRN: [id number] ?DOB: [birth year] ?Today's Date: 04/14/2022 ?Time: 1610-1630 ?SLP Time Calculation (min) (ACUTE ONLY): 20 min ? ?Assessment / Plan / Recommendation ?Clinical Impression ? Patient seen by SLP for skilled treatment session focused on dysphagia goals. Patient was awake but kept eyes mostly closed and required SLP to assist with positioning his head to more neutral positioning (at rest leaning to right). Patient was able to verbalize drinks he wanted when given choice, asking for orange juice and later when asked if he was hungry for anything, he requested, "some water". SLP first attempted straw sips of nectar thick juice but patient was not able to elicit a suck response. SLP then gave patient cup sips and patient tolerated without overt s/s aspiration or penetration but with suspected swallow initiation delays. SLP then observed patient with PO intake of thin liquids (plain water). He again exhibited suspected swallow initiation delays but no overt s/s aspiration or penetration. SLP is recommending to continue with Dys 1, nectar thick liquids at this time but plan for f/u next 1-2 dates for upgraded liquids and solids trials versus need for objective swallow study (MBS). ?  ?HPI HPI: 72 y.o. male presented 5/2 with worsening strength , new decubitus ulcers and poor intake.  Recent admission for cellulitis and hypoglycemia, had just returned to ALF from SNF a week ago but not OOB since then. CXR shows Low lung volumes with mild atelectasis at the right lung base. No  acute infiltrate is identified.. ?  ?   ?SLP Plan ? Continue with current plan of care ? ?  ?  ?Recommendations for follow up therapy are one component of a multi-disciplinary discharge planning process, led by the attending physician.  Recommendations may be updated based on patient status, additional functional criteria and insurance authorization. ?   ? ?Recommendations  ?Diet recommendations: Nectar-thick liquid;Dysphagia 1 (puree) ?Medication Administration: Whole meds with puree ?Supervision: Full supervision/cueing for compensatory strategies;Trained caregiver to feed patient ?Compensations: Slow rate;Small sips/bites ?Postural Changes and/or Swallow Maneuvers: Seated upright 90 degrees  ?   ?    ?   ? ? ? ? Oral Care Recommendations: Oral care BID ?Follow Up Recommendations: Skilled nursing-short term rehab (<3 hours/day) ?Assistance recommended at discharge: Frequent or constant Supervision/Assistance ?SLP Visit Diagnosis: Dysphagia, unspecified (R13.10) ?Plan: Continue with current plan of care ? ? ? ? ?  ?  ? ? ?Angela Nevin, MA, CCC-SLP ?Speech Therapy ? ?

## 2022-04-14 NOTE — Progress Notes (Signed)
Pharmacy Antibiotic Note ? ?Christian [name] is a 71 y.o. male admitted on 03/30/2022 with Streptococcus constellatus bacteremia intermediate to ceftriaxone.  Pharmacy has been consulted for vancomycin dosing. ? ?Plan: ?D/C Ceftriaxone ?Vancomycin 2000mg  IV x 1 ?Vancomycin 1500mg  IV q24h for eAUC of 475, Scr used 1.21 ?Levels as necessary ?Monitor for deescalation and clinical course ? ?Weight: 109.6 kg (241 lb 10 oz) ? ?Temp (24hrs), Avg:99.9 ?F (37.7 ?C), Min:98.4 ?F (36.9 ?C), Max:101.9 ?F (38.8 ?C) ? ?Recent Labs  ?Lab 04/09/22 ?0309 04/11/22 ?0104 04/11/22 ?0422 04/11/22 ?04/13/22 04/12/22 ?0144 04/13/22 ?0730 04/14/22 ?0117 04/14/22 ?0148  ?WBC  --  10.2  --   --  9.8 10.0 10.0  --   ?CREATININE 1.67* 1.46*  --   --  1.47* 1.40*  --  1.21  ?LATICACIDVEN  --   --  1.9 1.8  --   --   --   --   ?  ?Estimated Creatinine Clearance: 74.7 mL/min (by C-G formula based on SCr of 1.21 mg/dL).   ? ?Allergies  ?Allergen Reactions  ? Amlodipine Swelling and Other (See Comments)  ?  Legs became swollen  ? Onglyza [Saxagliptin] Other (See Comments)  ?  "Allergic," per [facility]  ? Zyprexa [Olanzapine] Other (See Comments)  ?  Allergy noted by ACT Team. No reaction specified (??)- "Allergic," per [facility]  ? Ritalin [Methylphenidate] Anxiety and Other (See Comments)  ?  "[place]"  ? ? ?Antimicrobials this admission: ?Vanc 5/14 x 1, 5/17>> ?Cefepime 5/14 >>5/15 ?Flagyl 5/14 >> ?Rocephin 5/15 >>5/17 ? ?Dose adjustments this admission: ?N/a ? ?Microbiology results: ?5/14 BCx: Strep constellatus, I-CTX, S-Vanc, levo ?5/15 Bcx: NGTD ? ?Venia Riveron A. 6/14, PharmD, BCPS, FNKF ?Clinical Pharmacist ?[place] ?Please utilize Amion for appropriate phone number to reach the unit pharmacist [facility]) ? ?04/14/2022 8:05 AM ? ?

## 2022-04-14 NOTE — Progress Notes (Signed)
?   ? ? ? ? ?[place] for Infectious Disease ? ?Date of Admission:  03/30/2022    ? ?Abx: ?5/15-c ceftriaxone ?5/14-c metronidazole ?  ?5/14 vanc, cefepime                                                     ?  ?  ?Assessment: ?71 y.o. male snf resident, pad, bipolar, HFpEF, ckd, dm2, osa, afib admitted from snf for failure to thrive, with sacral decub ulcer present on admission, course complicated by [facility] with strep constellatus in setting of increased disharge from sacral ulcer ?  ?Hob strep constellatus ?Sacral decub ?-question of increasing sacral decub discharge ?-only has peripheral iv's and on admission LUE swelling but no sign of insertion site cellulitis ?-strep constellatus known to cause metastatic abscess and also high burden concerning for seeding heart valve ?-source GI vs decub vs skin ?  ?Will need ct abd/pelv to evaluate sacral/pelvic abscess abd abscess ?Will get limited tte to evaluate for valve involvement ?5/15 repeat bcx in progress ?  ?Pending further study unclear duration of tx at this time ? ?--------- ?5/17 assessment ?Tte no obvious vegetation -- low handoc score feels not needing tee at this time as low suspicion IE ?5/15 repeat bcx ngtd ?Still fever -- strep constellatus is intermediate to pcn and ceftriaxone ?Ct scan abd with contrast air and soft tissue edema over sacral ulcer -- no osseous involvement or abscess. No intraabdominal abscess ? ? ?Would treat strep constellatus bacteremia for 2 weeks starting today with appropriate abx ?Would treat the sacral celluliltis with 10 days although likely would be fine with just wound care ? ?No significant clinical change -- not sure if at this point he is much different from baseline ? ? ?  ?Plan: ?Daptomycin for strep bactermia; can keep ceftriaxone/flagyl for the sacral process although doesn't appear he needs treatment for it outside of wound care ?Once fever resolve can switch to oral linezolid and amox-clav  ?2 week treatment of  the strep and 1 week tx of sacral process starting today 5/17 ?Low suspicion endocarditis no need for tee ?Discussed with primary team ? ? ? ?I spent more than 50 minute reviewing data/chart, and coordinating care and >50% direct face to face time providing counseling/discussing diagnostics/treatment plan with patient ? ? ? ? ?Principal Problem: ?  Hyperkalemia ?Active Problems: ?  Bacteremia ?  Diabetes mellitus type 2 in nonobese [facility]) ?  Sacral decubitus ulcer, stage II ([place]) ?  Severe sepsis without septic shock ([place]) ?  Hypoglycemia ? ? ?Allergies  ?Allergen Reactions  ? Amlodipine Swelling and Other (See Comments)  ?  Legs became swollen  ? Onglyza [Saxagliptin] Other (See Comments)  ?  "Allergic," per [place]  ? Zyprexa [Olanzapine] Other (See Comments)  ?  Allergy noted by ACT Team. No reaction specified (??)- "Allergic," per [place]  ? Ritalin [Methylphenidate] Anxiety and Other (See Comments)  ?  "[place]"  ? ? ?Scheduled Meds: ? ARIPiprazole  10 mg Oral q morning  ? benztropine  1 mg Oral BID  ? diclofenac Sodium  2 g Topical QID  ? insulin aspart  0-9 Units Subcutaneous TID WC  ? insulin glargine-yfgn  8 Units Subcutaneous Daily  ? leptospermum manuka honey  1 application. Topical Daily  ? liothyronine  10 mcg Oral Daily  ? mouth rinse  15 mL Mouth Rinse BID  ? metroNIDAZOLE  500 mg Oral Q12H  ? multivitamin with minerals  1 tablet Oral Daily  ? rivaroxaban  20 mg Oral Q supper  ? senna-docusate  2 tablet Oral BID  ? sodium zirconium cyclosilicate  10 g Oral Daily  ? ?Continuous Infusions: ? sodium chloride 75 mL/hr at 04/13/22 2126  ? [START ON 04/15/2022] vancomycin    ? vancomycin    ? ?PRN Meds:.acetaminophen **OR** acetaminophen, bisacodyl, HYDROcodone-acetaminophen, melatonin ? ? ?SUBJECTIVE: ?No complaint ?He doesn't feel any different ?Sleeping most of time ?Eating  ? ? ?Review of Systems: ?ROS ?All other ROS was negative, except mentioned above ? ? ? ? ?OBJECTIVE: ?Vitals:   ? 04/14/22 0030 04/14/22 0337 04/14/22 0600 04/14/22 0732  ?BP: 128/72 137/81 100/67 107/69  ?Pulse: 96 (!) 102 99 (!) 101  ?Resp: (!) 21 20 20  (!) 23  ?Temp: 99.3 ?F (37.4 ?C) (!) 100.4 ?F (38 ?C) 99.8 ?F (37.7 ?C) (!) 100.6 ?F (38.1 ?C)  ?TempSrc:  Oral Oral Axillary  ?SpO2: 96% 98% 97% 98%  ?Weight:      ? ?Body mass index is 32.77 kg/m?. ? ?Physical Exam ? ?General/constitutional: no distress, pleasant ?HEENT: Normocephalic, PER, Conj Clear, EOMI, Oropharynx clear ?Neck supple ?CV: rrr no mrg ?Lungs: clear to auscultation, normal respiratory effort ?Abd: Soft, Nontender ?Ext: trace extremities edema ?Skin: No Rash -- fungotic toe nail changes ?Neuro: generalized weakness  ?MSK: no peripheral joint swelling/tenderness/warmth; back spines nontender ? ? ?Lab Results ?Lab Results  ?Component Value Date  ? WBC 10.0 04/14/2022  ? HGB 13.0 04/14/2022  ? HCT 41.7 04/14/2022  ? MCV 86.0 04/14/2022  ? PLT 251 04/14/2022  ?  ?Lab Results  ?Component Value Date  ? CREATININE 1.21 04/14/2022  ? BUN 34 (H) 04/14/2022  ? NA 139 04/14/2022  ? K 4.3 04/14/2022  ? CL 110 04/14/2022  ? CO2 22 04/14/2022  ?  ?Lab Results  ?Component Value Date  ? ALT 14 04/13/2022  ? AST 19 04/13/2022  ? ALKPHOS 55 04/13/2022  ? BILITOT 0.6 04/13/2022  ?  ? ? ?Microbiology: ?Recent Results (from the past 240 hour(s))  ?Culture, blood (Routine X 2) w Reflex to ID Panel     Status: Abnormal  ? Collection Time: 04/11/22  4:22 AM  ? Specimen: BLOOD RIGHT HAND  ?Result Value Ref Range Status  ? Specimen Description BLOOD RIGHT HAND  Final  ? Special Requests   Final  ?  BOTTLES DRAWN AEROBIC ONLY Blood Culture adequate volume  ? Culture  Setup Time   Final  ?  GRAM POSITIVE COCCI IN CHAINS ?AEROBIC BOTTLE ONLY ?CRITICAL RESULT CALLED TO, READ BACK BY AND VERIFIED WITH: T RUDISILL,PHARMD@[id number]  04/12/22 [place] ?Performed at [facility], [place] [address], [place], [place] [address] ?  ? Culture STREPTOCOCCUS CONSTELLATUS (A)  Final  ? Report Status  04/14/2022 FINAL  Final  ? Organism ID, Bacteria STREPTOCOCCUS CONSTELLATUS  Final  ?    Susceptibility  ? Streptococcus constellatus - MIC*  ?  PENICILLIN INTERMEDIATE Intermediate   ?  CEFTRIAXONE 2 INTERMEDIATE Intermediate   ?  ERYTHROMYCIN <=0.12 SENSITIVE Sensitive   ?  LEVOFLOXACIN 0.5 SENSITIVE Sensitive   ?  VANCOMYCIN 0.5 SENSITIVE Sensitive   ?  * STREPTOCOCCUS CONSTELLATUS  ?Blood Culture ID Panel (Reflexed)     Status: Abnormal  ? Collection Time: 04/11/22  4:22 AM  ?Result Value Ref  Range Status  ? Enterococcus faecalis NOT DETECTED NOT DETECTED Final  ? Enterococcus Faecium NOT DETECTED NOT DETECTED Final  ? Listeria monocytogenes NOT DETECTED NOT DETECTED Final  ? Staphylococcus species NOT DETECTED NOT DETECTED Final  ? Staphylococcus aureus (BCID) NOT DETECTED NOT DETECTED Final  ? Staphylococcus epidermidis NOT DETECTED NOT DETECTED Final  ? Staphylococcus lugdunensis NOT DETECTED NOT DETECTED Final  ? Streptococcus species DETECTED (A) NOT DETECTED Final  ?  Comment: Not Enterococcus species, Streptococcus agalactiae, Streptococcus pyogenes, or Streptococcus pneumoniae. ?CRITICAL RESULT CALLED TO, READ BACK BY AND VERIFIED WITH: ?T RUDISILL,PHARMD@[id number]  04/12/22 [place] ?  ? Streptococcus agalactiae NOT DETECTED NOT DETECTED Final  ? Streptococcus pneumoniae NOT DETECTED NOT DETECTED Final  ? Streptococcus pyogenes NOT DETECTED NOT DETECTED Final  ? A.calcoaceticus-baumannii NOT DETECTED NOT DETECTED Final  ? Bacteroides fragilis NOT DETECTED NOT DETECTED Final  ? Enterobacterales NOT DETECTED NOT DETECTED Final  ? Enterobacter cloacae complex NOT DETECTED NOT DETECTED Final  ? Escherichia coli NOT DETECTED NOT DETECTED Final  ? Klebsiella aerogenes NOT DETECTED NOT DETECTED Final  ? Klebsiella oxytoca NOT DETECTED NOT DETECTED Final  ? Klebsiella pneumoniae NOT DETECTED NOT DETECTED Final  ? Proteus species NOT DETECTED NOT DETECTED Final  ? Salmonella species NOT DETECTED NOT DETECTED Final  ?  Serratia marcescens NOT DETECTED NOT DETECTED Final  ? Haemophilus influenzae NOT DETECTED NOT DETECTED Final  ? Neisseria meningitidis NOT DETECTED NOT DETECTED Final  ? Pseudomonas aeruginosa NOT DETECTED NOT DET

## 2022-04-14 NOTE — Progress Notes (Signed)
Physical Therapy Treatment ?Patient Details ?Name: Christian [name] ?MRN: [id number] ?DOB: 04-30-1951 ?Today's Date: 04/14/2022 ? ? ?History of Present Illness 71 y.o. male presented 5/2 with worsening strength , new decubitus ulcers and poor intake.  Recent admission for cellulitis and hypoglycemia, had just returned to ALF from SNF a week ago but not OOB since then.  Has new hypoglyemia, elevation of K+, creatinine, CK.  Ulcer is not infected but has LE skin is also irritated and has skin breakdown. LEFt UE swollen and Doppler ordered 5/15 to r/o DVT.  PMHx:  chronic venous stasis and chronic right shin unhealing wound, chronic HFpEF, IDDM, CAD with stenting x2 in 2021, PVD, CKD stage IIIa, HTN, DVT on Xarelto, bipolar disorder, OSA on CPAP at bedtime ? ?  ?PT Comments  ? ? Pt does not open eyes throughout session and is somewhat lethargic, however is able to follow some commands and provide some muscle activation with AAROM of UE and LE. ROM limited by stiffness/tone, which improves slightly with reps.  When attempt to move pt towards EoB, pt reports pain on his bottom and actively resists movement. Requested of MD and RN that pt be placed on air mattress for some pressure relief on his sacral wound. D/c plans remain appropriate at this time. PT will continue to follow acutely.  ?  ?Recommendations for follow up therapy are one component of a multi-disciplinary discharge planning process, led by the attending physician.  Recommendations may be updated based on patient status, additional functional criteria and insurance authorization. ? ?Follow Up Recommendations ? Skilled nursing-short term rehab (<3 hours/day) ?  ?  ?Assistance Recommended at Discharge Frequent or constant Supervision/Assistance  ?Patient can return home with the following Two people to help with walking and/or transfers;Assistance with cooking/housework;Assist for transportation;Help with stairs or ramp for entrance;Two people to help with  bathing/dressing/bathroom;Assistance with feeding;Direct supervision/assist for medications management;Direct supervision/assist for financial management ?  ?Equipment Recommendations ? None recommended by PT  ?  ?   ?Precautions / Restrictions Precautions ?Precautions: Fall ?Precaution Comments: monitor vitals, HR and BP esp ?Restrictions ?Weight Bearing Restrictions: No  ?  ? ?Mobility ? Bed Mobility ?Overal bed mobility: Needs Assistance ?Bed Mobility: Supine to Sit ?  ?  ?Supine to sit: Total assist (unable to achieve) ?  ?  ?General bed mobility comments: deferred as pt with increased resistance with movement of LE to EoB ?  ? ?Transfers ?  ?  ?  ?  ?  ?  ?  ?  ?  ?General transfer comment: unable ?  ? ?Ambulation/Gait ?  ?  ?  ?  ?  ?  ?  ?General Gait Details: unable ? ? ? ? ?  ?   ?Cognition Arousal/Alertness: Lethargic ?Behavior During Therapy: Flat affect ?Overall Cognitive Status: No family/caregiver present to determine baseline cognitive functioning ?  ?  ?  ?  ?  ?  ?  ?  ?  ?  ?  ?  ?  ?  ?  ?  ?General Comments: eyes closed throughout session, attempts command follow 30% of the time ?  ?  ? ?  ?Exercises General Exercises - Upper Extremity ?Shoulder Flexion: AAROM, Both, 5 reps, Supine ?Elbow Flexion: AAROM, Both, 5 reps, Supine ?Elbow Extension: AAROM, Both, 5 reps, Supine ?General Exercises - Lower Extremity ?Heel Slides: Both, 5 reps, Supine, AAROM ?Hip ABduction/ADduction: Both, 5 reps, Supine, AAROM ?Straight Leg Raises: Both, 5 reps, Supine, AAROM ? ?  ?General Comments  General comments (skin integrity, edema, etc.): VSS, AAROM limited by increased stiffness ?  ?  ? ?Pertinent Vitals/Pain Pain Assessment ?Pain Assessment: Faces ?Faces Pain Scale: Hurts whole lot ?Breathing: normal ?Negative Vocalization: none ?Facial Expression: smiling or inexpressive ?Body Language: relaxed ?Consolability: no need to console ?PAINAD Score: 0 ?Pain Location: sacrum, generalized ?Pain Descriptors / Indicators:  Grimacing, Guarding ?Pain Intervention(s): Limited activity within patient's tolerance, Monitored during session  ? ? ? ?PT Goals (current goals can now be found in the care plan section) Acute Rehab PT Goals ?PT Goal Formulation: With patient ?Time For Goal Achievement: 04/26/22 ?Potential to Achieve Goals: Fair ?Progress towards PT goals: Not progressing toward goals - comment (participates in AAROM of all 4 extremities but resists attempts at bed mobility) ? ?  ?Frequency ? ? ? Min 2X/week ? ? ? ?  ?PT Plan Current plan remains appropriate  ? ? ?   ?AM-PAC PT "6 Clicks" Mobility   ?Outcome Measure ? Help needed turning from your back to your side while in a flat bed without using bedrails?: Total ?Help needed moving from lying on your back to sitting on the side of a flat bed without using bedrails?: Total ?Help needed moving to and from a bed to a chair (including a wheelchair)?: Total ?Help needed standing up from a chair using your arms (e.g., wheelchair or bedside chair)?: Total ?Help needed to walk in hospital room?: Total ?Help needed climbing 3-5 steps with a railing? : Total ?6 Click Score: 6 ? ?  ?End of Session   ?Activity Tolerance: Patient limited by pain ?Patient left: in bed;with call bell/phone within reach;with bed alarm set ?Nurse Communication: Mobility status ?PT Visit Diagnosis: Unsteadiness on feet (R26.81);History of falling (Z91.81);Repeated falls (R29.6);Difficulty in walking, not elsewhere classified (R26.2) ?  ? ? ?Time: 1207-1218 ?PT Time Calculation (min) (ACUTE ONLY): 11 min ? ?Charges:  $Therapeutic Exercise: 8-22 mins          ?          ? ?Simren Popson B. Migdalia Dk PT, DPT ?Acute Rehabilitation Services ?Please use secure chat or  ?Call Office [phone] ? ? ? ?[place] ?04/14/2022, 3:16 PM ? ?

## 2022-04-14 NOTE — Plan of Care (Signed)
  Problem: Clinical Measurements: Goal: Will remain free from infection Outcome: Progressing Goal: Respiratory complications will improve Outcome: Progressing Goal: Cardiovascular complication will be avoided Outcome: Progressing   Problem: Pain Managment: Goal: General experience of comfort will improve Outcome: Progressing   

## 2022-04-15 ENCOUNTER — Ambulatory Visit: Payer: Medicare HMO

## 2022-04-15 DIAGNOSIS — A419 Sepsis, unspecified organism: Secondary | ICD-10-CM | POA: Diagnosis not present

## 2022-04-15 DIAGNOSIS — R7881 Bacteremia: Secondary | ICD-10-CM | POA: Diagnosis not present

## 2022-04-15 DIAGNOSIS — R6520 Severe sepsis without septic shock: Secondary | ICD-10-CM | POA: Diagnosis not present

## 2022-04-15 DIAGNOSIS — L89152 Pressure ulcer of sacral region, stage 2: Secondary | ICD-10-CM | POA: Diagnosis not present

## 2022-04-15 DIAGNOSIS — A411 Sepsis due to other specified staphylococcus: Secondary | ICD-10-CM

## 2022-04-15 DIAGNOSIS — F319 Bipolar disorder, unspecified: Secondary | ICD-10-CM

## 2022-04-15 LAB — BASIC METABOLIC PANEL
Anion gap: 10 (ref 5–15)
BUN: 37 mg/dL — ABNORMAL HIGH (ref 8–23)
CO2: 17 mmol/L — ABNORMAL LOW (ref 22–32)
Calcium: 8.8 mg/dL — ABNORMAL LOW (ref 8.9–10.3)
Chloride: 114 mmol/L — ABNORMAL HIGH (ref 98–111)
Creatinine, Ser: 1.39 mg/dL — ABNORMAL HIGH (ref 0.61–1.24)
GFR, Estimated: 55 mL/min — ABNORMAL LOW (ref >60)
Glucose, Bld: 173 mg/dL — ABNORMAL HIGH (ref 70–99)
Potassium: 5.3 mmol/L — ABNORMAL HIGH (ref 3.5–5.1)
Sodium: 141 mmol/L (ref 135–145)

## 2022-04-15 LAB — CK: Total CK: 63 U/L (ref 49–397)

## 2022-04-15 LAB — GLUCOSE, CAPILLARY
Glucose-Capillary: 146 mg/dL — ABNORMAL HIGH (ref 70–99)
Glucose-Capillary: 258 mg/dL — ABNORMAL HIGH (ref 70–99)
Glucose-Capillary: 235 mg/dL — ABNORMAL HIGH (ref 70–99)
Glucose-Capillary: 146 mg/dL — ABNORMAL HIGH (ref 70–99)

## 2022-04-15 MED ORDER — SODIUM ZIRCONIUM CYCLOSILICATE 10 G PO PACK
10.0000 g | PACK | Freq: Two times a day (BID) | ORAL | Status: DC
  Administered 2022-04-15 – 2022-04-17 (×5): 10 g via ORAL
  Filled 2022-04-15 (×5): qty 1

## 2022-04-15 MED ORDER — INSULIN GLARGINE-YFGN 100 UNIT/ML ~~LOC~~ SOLN
10.0000 [IU] | Freq: Every day | SUBCUTANEOUS | Status: DC
  Administered 2022-04-16 – 2022-04-30 (×14): 10 [IU] via SUBCUTANEOUS
  Filled 2022-04-15 (×16): qty 0.1

## 2022-04-15 NOTE — Progress Notes (Signed)
[facility]  Date of Admission:  03/30/2022     Abx: 5/17-c daptomycin 5/17-c amox-clav  5/15-17 ceftriaxone 5/14-17 metronidazole 5/14 vanc, cefepime                                                         Assessment: 71 y.o. male snf resident, pad, bipolar, HFpEF, ckd, dm2, osa, afib admitted from snf for failure to thrive, with sacral decub ulcer present on admission, course complicated by [facility] with strep constellatus in setting of increased disharge from sacral ulcer   Hob strep constellatus Sacral decub -question of increasing sacral decub discharge -only has peripheral iv's and on admission LUE swelling but no sign of insertion site cellulitis -strep constellatus known to cause metastatic abscess and also high burden concerning for seeding heart valve -source GI vs decub vs skin   Will need ct abd/pelv to evaluate sacral/pelvic abscess abd abscess Will get limited tte to evaluate for valve involvement 5/15 repeat bcx in progress   Pending further study unclear duration of tx at this time  --------- 5/18 assessment Tte no obvious vegetation -- low handoc score feels not needing tee at this time as low suspicion IE 5/15 repeat bcx ngtd Still fever -- strep constellatus is intermediate to pcn and ceftriaxone Ct scan abd with contrast air and soft tissue edema over sacral ulcer -- no osseous involvement or abscess. No intraabdominal abscess   He reports despite fever feeling better (not specific about what is better). Hemodynamics stable and clinically stable otherwise. We reviewed the sacral ulcer today stage 2 and no significant cellulitis/fluctuance there. Again suspect this is his new baseline moving forward   Would finish 2 week strep constellatus bsi treatment and 10 day course for sacral ulcer cellulitis tx     Plan: Continue daptomycin and oral amox-clav Once fever resolve switch daptomycin to oral linezolid and continue  amox-clav Finish dapto-->linezolid on 5/31 Finish amox-clav 5/24 Discussed with primary team   I spent more than 35 minute reviewing data/chart, and coordinating care and >50% direct face to face time providing counseling/discussing diagnostics/treatment plan with patient    Principal Problem:   Hyperkalemia Active Problems:   Bacteremia   Diabetes mellitus type 2 in nonobese (HCC)   Sacral decubitus ulcer, stage II (HCC)   Severe sepsis without septic shock (HCC)   Hypoglycemia   Allergies  Allergen Reactions   Amlodipine Swelling and Other (See Comments)    Legs became swollen   Onglyza [Saxagliptin] Other (See Comments)    "Allergic," per [facility]   Zyprexa [Olanzapine] Other (See Comments)    Allergy noted by ACT Team. No reaction specified (??)- "Allergic," per [facility]   Ritalin [Methylphenidate] Anxiety and Other (See Comments)    "[place]"    Scheduled Meds:  amoxicillin-clavulanate  1 tablet Oral Q12H   ARIPiprazole  10 mg Oral q morning   benztropine  1 mg Oral BID   diclofenac Sodium  2 g Topical QID   insulin aspart  0-9 Units Subcutaneous TID WC   [START ON 04/16/2022] insulin glargine-yfgn  10 Units Subcutaneous Daily   leptospermum manuka honey  1 application. Topical Daily   liothyronine  10 mcg Oral Daily   mouth rinse  15 mL  Mouth Rinse BID   multivitamin with minerals  1 tablet Oral Daily   rivaroxaban  20 mg Oral Q supper   senna-docusate  2 tablet Oral BID   sodium zirconium cyclosilicate  10 g Oral BID   Continuous Infusions:  sodium chloride 50 mL/hr at 04/14/22 1142   DAPTOmycin (CUBICIN)  IV 700 mg (04/14/22 1144)   PRN Meds:.acetaminophen **OR** acetaminophen, bisacodyl, HYDROcodone-acetaminophen, melatonin   SUBJECTIVE: Said he is feeling better; couldn't describe what Still febrile Hds No diarrhea No n/v/abd pain No chest pain/cough/choking   Review of Systems: ROS All other ROS was negative, except  mentioned above     OBJECTIVE: Vitals:   04/15/22 0000 04/15/22 0318 04/15/22 0803 04/15/22 1200  BP: 100/66 95/66 98/62  108/75  Pulse: 98 68 72 (!) 107  Resp: 15 19 15 20   Temp: 100.2 F (37.9 C) 97.9 F (36.6 C) (!) 97.5 F (36.4 C) 98.1 F (36.7 C)  TempSrc: Oral Oral Axillary Oral  SpO2: 94% 98% 95% 94%  Weight:       Body mass index is 32.77 kg/m.  Physical Exam  General/constitutional: no distress, pleasant, chronically slow in mentation; tracking/interactive HEENT: Normocephalic, PER, Conj Clear, EOMI, Oropharynx clear Neck supple CV: rrr no mrg Lungs: clear to auscultation, normal respiratory effort Abd: Soft, Nontender Ext: trace ext edema Skin: No Rash -- fungotic toe nail changes Neuro: generalized weakness  GU: large irregular shape sacral ulcer stage 2 without purulence; some eschar in middle of ulcer; no fluctuance; no tenderness   Lab Results Lab Results  Component Value Date   WBC 10.0 04/14/2022   HGB 13.0 04/14/2022   HCT 41.7 04/14/2022   MCV 86.0 04/14/2022   PLT 251 04/14/2022    Lab Results  Component Value Date   CREATININE 1.39 (H) 04/15/2022   BUN 37 (H) 04/15/2022   NA 141 04/15/2022   K 5.3 (H) 04/15/2022   CL 114 (H) 04/15/2022   CO2 17 (L) 04/15/2022    Lab Results  Component Value Date   ALT 14 04/13/2022   AST 19 04/13/2022   ALKPHOS 55 04/13/2022   BILITOT 0.6 04/13/2022      Microbiology: Recent Results (from the past 240 hour(s))  Culture, blood (Routine X 2) w Reflex to ID Panel     Status: Abnormal   Collection Time: 04/11/22  4:22 AM   Specimen: BLOOD RIGHT HAND  Result Value Ref Range Status   Specimen Description BLOOD RIGHT HAND  Final   Special Requests   Final    BOTTLES DRAWN AEROBIC ONLY Blood Culture adequate volume   Culture  Setup Time   Final    GRAM POSITIVE COCCI IN CHAINS AEROBIC BOTTLE ONLY CRITICAL RESULT CALLED TO, READ BACK BY AND VERIFIED WITH: T RUDISILL,PHARMD@0612  04/12/22  MK Performed at [facility], [address] [place], [place] [address]    Culture STREPTOCOCCUS CONSTELLATUS (A)  Final   Report Status 04/14/2022 FINAL  Final   Organism ID, Bacteria STREPTOCOCCUS CONSTELLATUS  Final      Susceptibility   Streptococcus constellatus - MIC*    PENICILLIN INTERMEDIATE Intermediate     CEFTRIAXONE 2 INTERMEDIATE Intermediate     ERYTHROMYCIN <=0.12 SENSITIVE Sensitive     LEVOFLOXACIN 0.5 SENSITIVE Sensitive     VANCOMYCIN 0.5 SENSITIVE Sensitive     * STREPTOCOCCUS CONSTELLATUS  Blood Culture ID Panel (Reflexed)     Status: Abnormal   Collection Time: 04/11/22  4:22 AM  Result Value  Ref Range Status   Enterococcus faecalis NOT DETECTED NOT DETECTED Final   Enterococcus Faecium NOT DETECTED NOT DETECTED Final   Listeria monocytogenes NOT DETECTED NOT DETECTED Final   Staphylococcus species NOT DETECTED NOT DETECTED Final   Staphylococcus aureus (BCID) NOT DETECTED NOT DETECTED Final   Staphylococcus epidermidis NOT DETECTED NOT DETECTED Final   Staphylococcus lugdunensis NOT DETECTED NOT DETECTED Final   Streptococcus species DETECTED (A) NOT DETECTED Final    Comment: Not Enterococcus species, Streptococcus agalactiae, Streptococcus pyogenes, or Streptococcus pneumoniae. CRITICAL RESULT CALLED TO, READ BACK BY AND VERIFIED WITH: T RUDISILL,PHARMD@0612  04/12/22 MK    Streptococcus agalactiae NOT DETECTED NOT DETECTED Final   Streptococcus pneumoniae NOT DETECTED NOT DETECTED Final   Streptococcus pyogenes NOT DETECTED NOT DETECTED Final   A.calcoaceticus-baumannii NOT DETECTED NOT DETECTED Final   Bacteroides fragilis NOT DETECTED NOT DETECTED Final   Enterobacterales NOT DETECTED NOT DETECTED Final   Enterobacter cloacae complex NOT DETECTED NOT DETECTED Final   Escherichia coli NOT DETECTED NOT DETECTED Final   Klebsiella aerogenes NOT DETECTED NOT DETECTED Final   Klebsiella oxytoca NOT DETECTED NOT DETECTED Final   Klebsiella  pneumoniae NOT DETECTED NOT DETECTED Final   Proteus species NOT DETECTED NOT DETECTED Final   Salmonella species NOT DETECTED NOT DETECTED Final   Serratia marcescens NOT DETECTED NOT DETECTED Final   Haemophilus influenzae NOT DETECTED NOT DETECTED Final   Neisseria meningitidis NOT DETECTED NOT DETECTED Final   Pseudomonas aeruginosa NOT DETECTED NOT DETECTED Final   Stenotrophomonas maltophilia NOT DETECTED NOT DETECTED Final   Candida albicans NOT DETECTED NOT DETECTED Final   Candida auris NOT DETECTED NOT DETECTED Final   Candida glabrata NOT DETECTED NOT DETECTED Final   Candida krusei NOT DETECTED NOT DETECTED Final   Candida parapsilosis NOT DETECTED NOT DETECTED Final   Candida tropicalis NOT DETECTED NOT DETECTED Final   Cryptococcus neoformans/gattii NOT DETECTED NOT DETECTED Final    Comment: Performed at [facility], [address] [place], [place] [address]  Culture, blood (Routine X 2) w Reflex to ID Panel     Status: Abnormal   Collection Time: 04/11/22  4:38 AM   Specimen: BLOOD  Result Value Ref Range Status   Specimen Description BLOOD RIGHT ANTECUBITAL  Final   Special Requests   Final    BOTTLES DRAWN AEROBIC ONLY Blood Culture adequate volume   Culture  Setup Time   Final    GRAM POSITIVE COCCI IN CHAINS AEROBIC BOTTLE ONLY CRITICAL VALUE NOTED.  VALUE IS CONSISTENT WITH PREVIOUSLY REPORTED AND CALLED VALUE.    Culture (A)  Final    STREPTOCOCCUS CONSTELLATUS SUSCEPTIBILITIES PERFORMED ON PREVIOUS CULTURE WITHIN THE LAST 5 DAYS. Performed at [facility], [address] [place], [place] [address]    Report Status 04/14/2022 FINAL  Final  Culture, blood (Routine X 2) w Reflex to ID Panel     Status: None (Preliminary result)   Collection Time: 04/12/22 12:20 PM   Specimen: BLOOD  Result Value Ref Range Status   Specimen Description BLOOD RIGHT ANTECUBITAL  Final   Special Requests   Final    BOTTLES DRAWN AEROBIC AND ANAEROBIC Blood  Culture adequate volume   Culture   Final    NO GROWTH 3 DAYS Performed at [facility], [address], [place], [place] [address]    Report Status PENDING  Incomplete  Culture, blood (Routine X 2) w Reflex to ID Panel  Status: None (Preliminary result)   Collection Time: 04/12/22 12:22 PM   Specimen: BLOOD LEFT HAND  Result Value Ref Range Status   Specimen Description BLOOD LEFT HAND  Final   Special Requests   Final    BOTTLES DRAWN AEROBIC ONLY Blood Culture results may not be optimal due to an inadequate volume of blood received in culture bottles   Culture   Final    NO GROWTH 3 DAYS Performed at [facility] [place] [facility], [address], [place], [place] [address]    Report Status PENDING  Incomplete     Serology:   Imaging: If present, new imagings (plain films, ct scans, and mri) have been personally visualized and interpreted; radiology reports have been reviewed. Decision making incorporated into the Impression / Recommendations.   5/16 abd pelv ct with contrast 1. Inferior sacral decubitus ulceration with underlying ill-defined subcutaneous fluid and soft tissue emphysema consistent with soft tissue infection. No evidence of rectal fistula or sacrococcygeal osteomyelitis. 2. No intra-abdominal fluid collections, ascites or free air. The appendix appears normal. 3. Prominent stool throughout the colon. 4. 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 5. Coronary and Aortic Atherosclerosis   5/16 tte Conclusion(s)/Recommendation(s): No evidence of valvular vegetations on  this transthoracic echocardiogram. Consider a transesophageal  echocardiogram to exclude infective endocarditis if clinically indicated.   Raymondo Band, MD [facility] [phone] pager    04/15/2022, 3:33 PM

## 2022-04-15 NOTE — Progress Notes (Signed)
Speech Language Pathology Treatment: Dysphagia  Patient Details Name: Christian [name] MRN: [id number] DOB: 24-Jun-1951 Today's Date: 04/15/2022 Time: 0932-6712 SLP Time Calculation (min) (ACUTE ONLY): 16 min  Assessment / Plan / Recommendation Clinical Impression  Followed up for dysphagia management. Pt appears to have improved mentation per chart review. He was responsive to questions (though he did have eyes closed, he would open with cueing). Upgraded trials of thin liquids were without overt s/sx of aspiration including 3 oz water challenge. Trialed small piece of solid, pt exhibited significantly prolonged mastication with inefficient oral clearance. Concern for poor endurance with solids across a meal persists. Recommend diet upgrade to thin liquids however continue dysphagia 1 (puree). SLP to continue to follow.    HPI HPI: 71 y.o. male presented 5/2 with worsening strength , new decubitus ulcers and poor intake.  Recent admission for cellulitis and hypoglycemia, had just returned to ALF from SNF a week ago but not OOB since then. CXR shows Low lung volumes with mild atelectasis at the right lung base. No  acute infiltrate is identified.Marland Kitchen      SLP Plan  Continue with current plan of care      Recommendations for follow up therapy are one component of a multi-disciplinary discharge planning process, led by the attending physician.  Recommendations may be updated based on patient status, additional functional criteria and insurance authorization.    Recommendations  Diet recommendations: Dysphagia 1 (puree);Thin liquid Liquids provided via: Cup;Straw Medication Administration:  (as tolerated) Supervision: Full supervision/cueing for compensatory strategies;Trained caregiver to feed patient Compensations: Slow rate;Small sips/bites;Minimize environmental distractions Postural Changes and/or Swallow Maneuvers: Seated upright 90 degrees;Upright 30-60 min after meal                 Oral Care Recommendations: Oral care BID Follow Up Recommendations: Skilled nursing-short term rehab (<3 hours/day) Assistance recommended at discharge: Frequent or constant Supervision/Assistance SLP Visit Diagnosis: Dysphagia, unspecified (R13.10);Dysphagia, oral phase (R13.11) Plan: Continue with current plan of care           Ardyth Gal MA, CCC-SLP Acute Rehabilitation Services    04/15/2022, 3:21 PM

## 2022-04-15 NOTE — TOC Progression Note (Addendum)
Transition of Care [facility]) - Progression Note    Patient Details  Name: Christian [name] MRN: [id number] Date of Birth: 07/11/1951  Transition of Care [facility] CM/SW Contact  Mearl Latin, LCSW Phone Number: 04/15/2022, 3:06 PM  Clinical Narrative:    3pm-[facility] has been unsuccessful in reaching patient's payee or the person covering her while on vacation.   Case discussed in QC meeting and it was determined patient may qualify for ltach based off his current medical condition. MD in agreement for referral. CSW contacted patient's daughter to ask if she is interested in referral and she stated she will call me back after her work meeting.  5pm-Per [facility], they have spoken with patient's payee and are comfortable starting [facility] authorization process so will proceed with SNF since LTACH unlikely to be approved.    Expected Discharge Plan: Skilled Nursing Facility Barriers to Discharge: Continued Medical Work up, English as a second language teacher, SNF Pending bed offer  Expected Discharge Plan and Services Expected Discharge Plan: Skilled Nursing Facility In-house Referral: Clinical Social Work   Post Acute Care Choice: Skilled Nursing Facility Living arrangements for the past 2 months: Single Family Home, Skilled Nursing Facility                                       Social Determinants of Health (SDOH) Interventions    Readmission Risk Interventions    04/13/2022    3:06 PM 04/13/2022   10:43 AM 01/28/2022   10:17 AM  Readmission Risk Prevention Plan  Transportation Screening  Complete Complete  PCP or Specialist Appt within 5-7 Days   Complete  PCP or Specialist Appt within 3-5 Days Complete    Home Care Screening   Complete  Medication Review (RN CM)   Complete  HRI or Home Care Consult Complete    Social Work Consult for Recovery Care Planning/Counseling Complete    Palliative Care Screening Not Applicable    Medication Review Oceanographer) Referral to Pharmacy  Complete   PCP or Specialist appointment within 3-5 days of discharge  Complete   HRI or Home Care Consult  Complete   SW Recovery Care/Counseling Consult  Complete   Palliative Care Screening  Not Applicable   Skilled Nursing Facility  Complete

## 2022-04-15 NOTE — Progress Notes (Signed)
PROGRESS NOTE        PATIENT DETAILS Name: Christian [name] Age: 71 y.o. Sex: male Date of Birth: 11/26/1951 Admit Date: 03/30/2022 Admitting Physician Rise Patience, MD PCP:Sun, Gari Crown, MD  Brief Summary: Patient is a 71 y.o.  male with a history of A-fib on Xarelto, HFpEF, PAD, DM-2, bipolar disorder-presented with generalized weakness/sacral decubitus ulcer-was found to have hypoglycemia, hyperkalemia and subsequently admitted to the hospitalist service-see below for further details.  Thought to have extrapyramidal symptoms-evaluated by psych/neurology-psych medications adjusted with clinical improvement.  Unfortunately-further hospital course complicated by development of fever-due to probable Streptococcus bacteremia and infected decubitus ulcer.  See below for further details.  Significant events: 5/2>> admit to [facility] for weakness/hypoglycemia/hyperkalemia/rigidity-thought to have EPS 5/14>> febrile/tachycardic-Per nursing staff-discharge from prior existing sacral decubitus ulcer.  Started on broad-spectrum IV antibiotics.  Prelim blood culture positive for Streptococcus species.   Significant studies: 5/3>> x-ray pelvis: No fracture. 5/3>> CT head: No acute intracranial findings. 5/3>> x-ray left shoulder: Rotator cuff impingement 5/14>> CXR:No Obvious PNA 5/15>> left upper extremity Doppler: No DVT. 5/16>> CT abdomen/pelvis: Inferior sacral decubitus ulceration-no evidence of osteomyelitis. 5/16>> Echo: EF 45-50%, no obvious vegetations.  Significant microbiology data: 5/14>> blood culture: Streptococcus constellatus  5/15>> blood culture: No growth  Procedures:   Consults: Neurology, psychiatry, infectious disease  Subjective: Awake/alert-afebrile this morning.  Looks better than the past few days.  Objective: Vitals: Blood pressure 98/62, pulse 72, temperature (!) 97.5 F (36.4 C), temperature source Axillary, resp. rate 15, weight  109.6 kg, SpO2 95 %.   Exam: Gen Exam:Alert awake-not in any distress HEENT:atraumatic, normocephalic Chest: B/L clear to auscultation anteriorly CVS:S1S2 regular Abdomen:soft non tender, non distended Extremities:no edema Neurology: Generalized weakness but able to move all 4 extremities. Skin: no rash   Pertinent Labs/Radiology:    Latest Ref Rng & Units 04/14/2022    1:17 AM 04/13/2022    7:30 AM 04/12/2022    1:44 AM  CBC  WBC 4.0 - 10.5 K/uL 10.0   10.0   9.8    Hemoglobin 13.0 - 17.0 g/dL 13.0   11.6   13.1    Hematocrit 39.0 - 52.0 % 41.7   36.9   41.4    Platelets 150 - 400 K/uL 251   239   245      Lab Results  Component Value Date   NA 141 04/15/2022   K 5.3 (H) 04/15/2022   CL 114 (H) 04/15/2022   CO2 17 (L) 04/15/2022      Assessment/Plan: Rigidity/tremors/mildly elevated CK: Felt to be more of extrapyramidal syndrome rather than NMS at this point.  Managed with supportive care.  After neurology/psych evaluation-has been resumed on Abilify and Cogentin-Wellbutrin/Seroquel has been discontinued.  AKI on CKD stage IIIa: AKI likely hemodynamically mediated-proved-but slight bump in creatinine today-encourage oral intake-avoid nephrotoxic agents-recheck electrolytes tomorrow.  Mild rhabdomyolysis: Nontraumatic-improved with supportive care.  Sepsis due to possible strep bacteremia-sacral decubitus ulcer with infection: Sepsis physiology improving-afebrile this morning-on daptomycin/Augmentin-ID following.  CT abdomen on 5/16 with no sacral osteomyelitis, transthoracic echo without any obvious vegetation.  Acute metabolic encephalopathy: Due to sepsis-improving-less lethargic today.  Continue supportive care.   Hyperkalemia: Lokelma dosing changed to daily a few days back-potassium again creeping up-will change back to twice daily dosing.  Hypoglycemia: Resolved with supportive care.  Do not resume Amaryl on discharge.  DM-2 (A1c 7.6 on 4/15): CBG relatively  stable-avoiding tight glycemic control-at risk for hypoglycemia-increase Semglee to 10 units today-continue SSI.    Recent Labs    04/14/22 1635 04/14/22 2101 04/15/22 0805  GLUCAP 264* 234* 146*     Hypothyroidism: Continue Cytomel.  PAF: Rate controlled-continue Xarelto  PAD: Follow-up with vascular surgery in the outpatient setting  HFpEF: Volume status stable.    Bipolar disorder: Given concerns for EPS-on Abilify and Cogentin-Depakote/Wellbutrin and Seroquel held.  Left shoulder pain: Supportive care-likely rotator cuff injury.  Stable to be followed up by orthopedics in the outpatient setting  Left arm swelling: Likely due to IVF infiltration-could have superficial venous thrombosis-left upper extremity Doppler negative for DVT.  Supportive local care at this point.  OSA: CPAP nightly  Debility/deconditioning: Has had worsening of his chronic debility during this hospitalization-plans are of SNF on discharge.  Per family-he has been nonambulatory since his discharge from [facility] in April 2023.  This likely resulted in decubitus ulcer that the patient presented with.  Nutrition Status: Nutrition Problem: Increased nutrient needs Etiology: chronic illness, wound healing Signs/Symptoms: estimated needs Interventions: MVI, Hormel Shake  Pressure Ulcer: Pressure Injury 03/31/22 Sacrum Mid Stage 3 -  Full thickness tissue loss. Subcutaneous fat may be visible but bone, tendon or muscle are NOT exposed. (Active)  03/31/22 1933  Location: Sacrum  Location Orientation: Mid  Staging: Stage 3 -  Full thickness tissue loss. Subcutaneous fat may be visible but bone, tendon or muscle are NOT exposed.  Wound Description (Comments):   Present on Admission: Yes  Dressing Type Foam - Lift dressing to assess site every shift 04/15/22 0721   Obesity: Estimated body mass index is 32.77 kg/m as calculated from the following:   Height as of 03/12/22: 6' (1.829 m).   Weight as of  this encounter: 109.6 kg.   Code status:   Code Status: Full Code   DVT Prophylaxis: rivaroxaban (XARELTO) tablet 20 mg    Family Communication: Daughter-[name]-[phone]-updated on 5/15   Disposition Plan: Status is: Inpatient Remains inpatient appropriate because: Not stable for discharge-has unfortunately developed sepsis physiology-Gram-positive bacteremia.   Planned Discharge Destination:Skilled nursing facility   Diet: Diet Order             DIET - DYS 1 Room service appropriate? Yes; Fluid consistency: Nectar Thick  Diet effective now                     Antimicrobial agents: Anti-infectives (From admission, onward)    Start     Dose/Rate Route Frequency Ordered Stop   04/15/22 0900  vancomycin (VANCOREADY) IVPB 1500 mg/300 mL  Status:  Discontinued        1,500 mg 150 mL/hr over 120 Minutes Intravenous Every 24 hours 04/14/22 0811 04/14/22 1005   04/14/22 2200  amoxicillin-clavulanate (AUGMENTIN) 875-125 MG per tablet 1 tablet        1 tablet Oral Every 12 hours 04/14/22 1331     04/14/22 1100  DAPTOmycin (CUBICIN) 700 mg in sodium chloride 0.9 % IVPB        8 mg/kg  90.4 kg (Adjusted) 128 mL/hr over 30 Minutes Intravenous Daily 04/14/22 1005     04/14/22 0900  vancomycin (VANCOREADY) IVPB 2000 mg/400 mL  Status:  Discontinued        2,000 mg 200 mL/hr over 120 Minutes Intravenous  Once 04/14/22 0811 04/14/22 1005   04/13/22 2200  metroNIDAZOLE (FLAGYL) tablet 500 mg  Status:  Discontinued        500 mg Oral Every 12 hours 04/13/22 1436 04/14/22 1331   04/12/22 0815  cefTRIAXone (ROCEPHIN) 2 g in sodium chloride 0.9 % 100 mL IVPB  Status:  Discontinued        2 g 200 mL/hr over 30 Minutes Intravenous Daily 04/12/22 0717 04/14/22 0811   04/12/22 0600  vancomycin (VANCOREADY) IVPB 1250 mg/250 mL  Status:  Discontinued        1,250 mg 166.7 mL/hr over 90 Minutes Intravenous Every 24 hours 04/11/22 0521 04/12/22 0717   04/11/22 1145   metroNIDAZOLE (FLAGYL) IVPB 500 mg  Status:  Discontinued        500 mg 100 mL/hr over 60 Minutes Intravenous Every 12 hours 04/11/22 1055 04/13/22 1436   04/11/22 0600  ceFEPIme (MAXIPIME) 2 g in sodium chloride 0.9 % 100 mL IVPB  Status:  Discontinued        2 g 200 mL/hr over 30 Minutes Intravenous Every 8 hours 04/11/22 0437 04/12/22 0717   04/11/22 0530  vancomycin (VANCOREADY) IVPB 2000 mg/400 mL        2,000 mg 200 mL/hr over 120 Minutes Intravenous  Once 04/11/22 0437 04/11/22 2008        MEDICATIONS: Scheduled Meds:  amoxicillin-clavulanate  1 tablet Oral Q12H   ARIPiprazole  10 mg Oral q morning   benztropine  1 mg Oral BID   diclofenac Sodium  2 g Topical QID   insulin aspart  0-9 Units Subcutaneous TID WC   insulin glargine-yfgn  8 Units Subcutaneous Daily   leptospermum manuka honey  1 application. Topical Daily   liothyronine  10 mcg Oral Daily   mouth rinse  15 mL Mouth Rinse BID   multivitamin with minerals  1 tablet Oral Daily   rivaroxaban  20 mg Oral Q supper   senna-docusate  2 tablet Oral BID   sodium zirconium cyclosilicate  10 g Oral Daily   Continuous Infusions:  sodium chloride 50 mL/hr at 04/14/22 1142   DAPTOmycin (CUBICIN)  IV 700 mg (04/14/22 1144)   PRN Meds:.acetaminophen **OR** acetaminophen, bisacodyl, HYDROcodone-acetaminophen, melatonin   I have personally reviewed following labs and imaging studies  LABORATORY DATA: CBC: Recent Labs  Lab 04/11/22 0104 04/12/22 0144 04/13/22 0730 04/14/22 0117  WBC 10.2 9.8 10.0 10.0  HGB 13.2 13.1 11.6* 13.0  HCT 41.9 41.4 36.9* 41.7  MCV 84.6 85.5 85.6 86.0  PLT 275 245 239 251     Basic Metabolic Panel: Recent Labs  Lab 04/11/22 0104 04/12/22 0144 04/13/22 0730 04/14/22 0148 04/15/22 0157  NA 130* 134* 140 139 141  K 4.9 4.8 4.2 4.3 5.3*  CL 98 104 111 110 114*  CO2 27 25 24 22  17*  GLUCOSE 300* 105* 112* 156* 173*  BUN 42* 41* 36* 34* 37*  CREATININE 1.46* 1.47* 1.40* 1.21  1.39*  CALCIUM 9.0 9.0 9.2 9.2 8.8*  MG  --   --   --  2.1  --      GFR: Estimated Creatinine Clearance: 65 mL/min (A) (by C-G formula based on SCr of 1.39 mg/dL (H)).  Liver Function Tests: Recent Labs  Lab 04/12/22 0144 04/13/22 0730  AST 21 19  ALT 14 14  ALKPHOS 54 55  BILITOT 0.8 0.6  PROT 6.6 6.0*  ALBUMIN 2.0* 1.8*    No results for input(s): LIPASE, AMYLASE in the last 168 hours. No results for input(s): AMMONIA in the last 168 hours.  Coagulation Profile:  No results for input(s): INR, PROTIME in the last 168 hours.  Cardiac Enzymes: Recent Labs  Lab 04/15/22 0157  CKTOTAL 63    BNP (last 3 results) No results for input(s): PROBNP in the last 8760 hours.  Lipid Profile: No results for input(s): CHOL, HDL, LDLCALC, TRIG, CHOLHDL, LDLDIRECT in the last 72 hours.  Thyroid Function Tests: No results for input(s): TSH, T4TOTAL, FREET4, T3FREE, THYROIDAB in the last 72 hours.  Anemia Panel: No results for input(s): VITAMINB12, FOLATE, FERRITIN, TIBC, IRON, RETICCTPCT in the last 72 hours.  Urine analysis:    Component Value Date/Time   COLORURINE YELLOW 04/11/2022 0540   APPEARANCEUR HAZY (A) 04/11/2022 0540   APPEARANCEUR Cloudy 08/01/2014 1614   LABSPEC 1.017 04/11/2022 0540   LABSPEC 1.008 08/01/2014 1614   PHURINE 7.0 04/11/2022 0540   GLUCOSEU >=500 (A) 04/11/2022 0540   GLUCOSEU Negative 08/01/2014 1614   HGBUR NEGATIVE 04/11/2022 0540   BILIRUBINUR NEGATIVE 04/11/2022 0540   BILIRUBINUR Negative 08/01/2014 1614   KETONESUR NEGATIVE 04/11/2022 0540   PROTEINUR NEGATIVE 04/11/2022 0540   UROBILINOGEN 0.2 07/01/2014 1809   NITRITE NEGATIVE 04/11/2022 0540   LEUKOCYTESUR NEGATIVE 04/11/2022 0540   LEUKOCYTESUR Negative 08/01/2014 1614    Sepsis Labs: Lactic Acid, Venous    Component Value Date/Time   LATICACIDVEN 1.8 04/11/2022 0651    MICROBIOLOGY: Recent Results (from the past 240 hour(s))  Culture, blood (Routine X 2) w Reflex to  ID Panel     Status: Abnormal   Collection Time: 04/11/22  4:22 AM   Specimen: BLOOD RIGHT HAND  Result Value Ref Range Status   Specimen Description BLOOD RIGHT HAND  Final   Special Requests   Final    BOTTLES DRAWN AEROBIC ONLY Blood Culture adequate volume   Culture  Setup Time   Final    GRAM POSITIVE COCCI IN CHAINS AEROBIC BOTTLE ONLY CRITICAL RESULT CALLED TO, READ BACK BY AND VERIFIED WITH: T RUDISILL,[email or web address]@[id number]  04/12/22 [place] Performed at [facility], [place] [address] [place], [place] [address]    Culture STREPTOCOCCUS CONSTELLATUS (A)  Final   Report Status 04/14/2022 FINAL  Final   Organism ID, Bacteria STREPTOCOCCUS CONSTELLATUS  Final      Susceptibility   Streptococcus constellatus - MIC*    PENICILLIN INTERMEDIATE Intermediate     CEFTRIAXONE 2 INTERMEDIATE Intermediate     ERYTHROMYCIN <=0.12 SENSITIVE Sensitive     LEVOFLOXACIN 0.5 SENSITIVE Sensitive     VANCOMYCIN 0.5 SENSITIVE Sensitive     * STREPTOCOCCUS CONSTELLATUS  Blood Culture ID Panel (Reflexed)     Status: Abnormal   Collection Time: 04/11/22  4:22 AM  Result Value Ref Range Status   Enterococcus faecalis NOT DETECTED NOT DETECTED Final   Enterococcus Faecium NOT DETECTED NOT DETECTED Final   Listeria monocytogenes NOT DETECTED NOT DETECTED Final   Staphylococcus species NOT DETECTED NOT DETECTED Final   Staphylococcus aureus (BCID) NOT DETECTED NOT DETECTED Final   Staphylococcus epidermidis NOT DETECTED NOT DETECTED Final   Staphylococcus lugdunensis NOT DETECTED NOT DETECTED Final   Streptococcus species DETECTED (A) NOT DETECTED Final    Comment: Not Enterococcus species, Streptococcus agalactiae, Streptococcus pyogenes, or Streptococcus pneumoniae. CRITICAL RESULT CALLED TO, READ BACK BY AND VERIFIED WITH: T RUDISILL,[email or web address]@[id number]  04/12/22 [place]    Streptococcus agalactiae NOT DETECTED NOT DETECTED Final   Streptococcus pneumoniae NOT DETECTED NOT DETECTED Final   Streptococcus pyogenes NOT  DETECTED NOT DETECTED Final   A.calcoaceticus-baumannii NOT DETECTED NOT DETECTED Final   Bacteroides  fragilis NOT DETECTED NOT DETECTED Final   Enterobacterales NOT DETECTED NOT DETECTED Final   Enterobacter cloacae complex NOT DETECTED NOT DETECTED Final   Escherichia coli NOT DETECTED NOT DETECTED Final   Klebsiella aerogenes NOT DETECTED NOT DETECTED Final   Klebsiella oxytoca NOT DETECTED NOT DETECTED Final   Klebsiella pneumoniae NOT DETECTED NOT DETECTED Final   Proteus species NOT DETECTED NOT DETECTED Final   Salmonella species NOT DETECTED NOT DETECTED Final   Serratia marcescens NOT DETECTED NOT DETECTED Final   Haemophilus influenzae NOT DETECTED NOT DETECTED Final   Neisseria meningitidis NOT DETECTED NOT DETECTED Final   Pseudomonas aeruginosa NOT DETECTED NOT DETECTED Final   Stenotrophomonas maltophilia NOT DETECTED NOT DETECTED Final   Candida albicans NOT DETECTED NOT DETECTED Final   Candida auris NOT DETECTED NOT DETECTED Final   Candida glabrata NOT DETECTED NOT DETECTED Final   Candida krusei NOT DETECTED NOT DETECTED Final   Candida parapsilosis NOT DETECTED NOT DETECTED Final   Candida tropicalis NOT DETECTED NOT DETECTED Final   Cryptococcus neoformans/gattii NOT DETECTED NOT DETECTED Final    Comment: Performed at [facility], [place] [address] [place], [place] [address]  Culture, blood (Routine X 2) w Reflex to ID Panel     Status: Abnormal   Collection Time: 04/11/22  4:38 AM   Specimen: BLOOD  Result Value Ref Range Status   Specimen Description BLOOD RIGHT ANTECUBITAL  Final   Special Requests   Final    BOTTLES DRAWN AEROBIC ONLY Blood Culture adequate volume   Culture  Setup Time   Final    GRAM POSITIVE COCCI IN CHAINS AEROBIC BOTTLE ONLY CRITICAL VALUE NOTED.  VALUE IS CONSISTENT WITH PREVIOUSLY REPORTED AND CALLED VALUE.    Culture (A)  Final    STREPTOCOCCUS CONSTELLATUS SUSCEPTIBILITIES PERFORMED ON PREVIOUS CULTURE WITHIN THE LAST 5  DAYS. Performed at [facility], [place] [address] [place], [place] [address]    Report Status 04/14/2022 FINAL  Final  Culture, blood (Routine X 2) w Reflex to ID Panel     Status: None (Preliminary result)   Collection Time: 04/12/22 12:20 PM   Specimen: BLOOD  Result Value Ref Range Status   Specimen Description BLOOD RIGHT ANTECUBITAL  Final   Special Requests   Final    BOTTLES DRAWN AEROBIC AND ANAEROBIC Blood Culture adequate volume   Culture   Final    NO GROWTH 3 DAYS Performed at [facility], [place] [address], [place], [place] [address]    Report Status PENDING  Incomplete  Culture, blood (Routine X 2) w Reflex to ID Panel     Status: None (Preliminary result)   Collection Time: 04/12/22 12:22 PM   Specimen: BLOOD LEFT HAND  Result Value Ref Range Status   Specimen Description BLOOD LEFT HAND  Final   Special Requests   Final    BOTTLES DRAWN AEROBIC ONLY Blood Culture results may not be optimal due to an inadequate volume of blood received in culture bottles   Culture   Final    NO GROWTH 3 DAYS Performed at [facility], [place] [address] [place], [place] [address]    Report Status PENDING  Incomplete    RADIOLOGY STUDIES/RESULTS: CT ABDOMEN PELVIS W CONTRAST  Result Date: 04/13/2022 CLINICAL DATA:  Sacral decubitus ulcer with hypoglycemia, fever and probable Streptococcus bacteremia. Concern for intra-abdominal abscess. EXAM: CT ABDOMEN AND PELVIS WITH CONTRAST TECHNIQUE: Multidetector CT imaging of the abdomen and  pelvis was performed using the standard protocol following bolus administration of intravenous contrast. RADIATION DOSE REDUCTION: This exam was performed according to the departmental dose-optimization program which includes automated exposure control, adjustment of the mA and/or kV according to patient size and/or use of iterative reconstruction technique. CONTRAST:  16mL OMNIPAQUE IOHEXOL 300 MG/ML  SOLN COMPARISON:  Abdominopelvic CT  04/02/2021 and 08/02/2014. FINDINGS: Lower chest: Dependent atelectasis at both lung bases which appears similar to previous study. No confluent airspace opacity, significant pleural or pericardial effusion. Diffuse atherosclerosis of the aorta and coronary arteries. There are calcifications of the aortic valve and mitral annulus. Hepatobiliary: The liver is normal in density without suspicious focal abnormality. No evidence of gallstones, gallbladder wall thickening or biliary dilatation. Pancreas: Unremarkable. No pancreatic ductal dilatation or surrounding inflammatory changes. Spleen: Normal in size without focal abnormality. Adrenals/Urinary Tract: Both adrenal glands appear normal. The kidneys appear unchanged. The left renal pelvis is dilated with mildly delayed contrast excretion, suggesting an extrarenal pelvis or mild UPJ stenosis. There is no perinephric soft tissue stranding or urinary tract calculus. The bladder appears unremarkable. Stomach/Bowel: Enteric contrast was administered and has passed into the rectum. The stomach appears unremarkable for its degree of distension. No evidence of bowel wall thickening, distention or surrounding inflammatory change. The appendix appears normal. Prominent stool throughout the colon. Posterior perirectal soft tissue emphysema, further described below, without definite fistula. Vascular/Lymphatic: There are no enlarged abdominal or pelvic lymph nodes. Aortic and branch vessel atherosclerosis. No acute vascular findings. Reproductive: Mild enlargement of the prostate gland with central dystrophic calcifications. The seminal vesicles appear unremarkable. Other: No ascites, extraluminal fluid collection or free air. There is decubitus ulceration over the inferior aspect of the sacrum on the right with inferior extension of ill-defined gas and fluid in the subcutaneous tissues inferior to the coccyx. These measure up to 4.4 cm transverse and do not contain enteric  contrast. No extension into the anterior perineum. Mild soft tissue stranding in the perirectal fat without focal fluid collection. Musculoskeletal: As above, sacral decubitus ulceration with soft tissue emphysema extending inferiorly. The sacrum and coccyx appear unchanged, without evidence of osteomyelitis. There is multilevel thoracolumbar spondylosis which appears similar. No evidence of discitis or osteomyelitis. IMPRESSION: 1. Inferior sacral decubitus ulceration with underlying ill-defined subcutaneous fluid and soft tissue emphysema consistent with soft tissue infection. No evidence of rectal fistula or sacrococcygeal osteomyelitis. 2. No intra-abdominal fluid collections, ascites or free air. The appendix appears normal. 3. Prominent stool throughout the colon. 4. 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 5. Coronary and Aortic Atherosclerosis (ICD10-I70.0). Electronically Signed   By: Richardean Sale M.D.   On: 04/13/2022 15:56   ECHOCARDIOGRAM LIMITED  Result Date: 04/13/2022    ECHOCARDIOGRAM LIMITED REPORT   Patient Name:   Christian [name] Date of Exam: 04/13/2022 Medical Rec #:  [id number]        Height:       72.0 in Accession #:    [id number]       Weight:       241.6 lb Date of Birth:  [birth year]        BSA:          2.309 m Patient Age:    50 years         BP:           138/87 mmHg Patient Gender: M  HR:           107 bpm. Exam Location:  Inpatient Procedure: Limited Echo, Cardiac Doppler, Color Doppler and Intracardiac            Opacification Agent Indications:    Bacteremia  History:        Patient has prior history of Echocardiogram examinations, most                 recent 04/03/2021. Previous Myocardial Infarction, Abnormal ECG,                 Arrythmias:Atrial Fibrillation, Signs/Symptoms:Chest Pain,                 Shortness of Breath, Dyspnea, Syncope, Altered Mental Status and                 Bacteremia; Risk Factors:Diabetes,  Hypertension and Sleep Apnea.                 Shock.  Sonographer:    Roseanna Rainbow RDCS Referring Phys: [id number] [facility] T [facility]  Sonographer Comments: Technically difficult study due to poor echo windows and patient is morbidly obese. Image acquisition challenging due to patient body habitus. IMPRESSIONS  1. Left ventricular ejection fraction, by estimation, is 45 to 50%. The left ventricle has mildly decreased function. There is mild hypokinesis of the left ventricular, apical anterior wall, inferoseptal wall and anteroseptal wall. There is mild dyskinesis of the left ventricular, entire apical segment.  2. Left atrial size was mildly dilated.  3. No evidence of mitral valve regurgitation. Mild mitral stenosis. The mean mitral valve gradient is 4.7 mmHg with average heart rate of 104 bpm. Moderate to severe mitral annular calcification.  4. The aortic valve is tricuspid. There is moderate calcification of the aortic valve. There is moderate thickening of the aortic valve. Aortic valve regurgitation is not visualized. Aortic valve sclerosis/calcification is present, without any evidence of aortic stenosis. Comparison(s): Prior images reviewed side by side. The left ventricular function is unchanged. The left ventricular wall motion abnormality is unchanged. A similar wall motion abnormality is seen on the images from 04/03/2021, but not on the images from 10/08/2020. Conclusion(s)/Recommendation(s): No evidence of valvular vegetations on this transthoracic echocardiogram. Consider a transesophageal echocardiogram to exclude infective endocarditis if clinically indicated. FINDINGS  Left Ventricle: No left ventricular thrombus is seen (Definity used). Left ventricular ejection fraction, by estimation, is 45 to 50%. The left ventricle has mildly decreased function. Mild hypokinesis of the left ventricular, apical anterior wall, inferoseptal wall and anteroseptal wall. Mild dyskinesis of the left ventricular, entire apical  segment. Definity contrast agent was given IV to delineate the left ventricular endocardial borders. The left ventricular internal cavity size was normal in size. There is no left ventricular hypertrophy.  LV Wall Scoring: The apex is dyskinetic. The apical septal segment, apical anterior segment, and apical inferior segment are hypokinetic. The anterior wall, entire lateral wall, anterior septum, inferior wall, mid inferoseptal segment, and basal inferoseptal segment are normal. Left Atrium: Left atrial size was mildly dilated. Mitral Valve: Moderate to severe mitral annular calcification. Mild mitral valve stenosis. MV peak gradient, 11.8 mmHg. The mean mitral valve gradient is 4.7 mmHg with average heart rate of 104 bpm. Aortic Valve: The aortic valve is tricuspid. There is moderate calcification of the aortic valve. There is moderate thickening of the aortic valve. Aortic valve regurgitation is not visualized. Aortic valve sclerosis/calcification is present, without any  evidence of aortic stenosis. Aortic  valve mean gradient measures 5.0 mmHg. Aortic valve peak gradient measures 9.0 mmHg. Aortic valve area, by VTI measures 2.86 cm. Aorta: The aortic root and ascending aorta are structurally normal, with no evidence of dilitation. Venous: The inferior vena cava was not well visualized. LEFT VENTRICLE PLAX 2D LVIDd:         5.00 cm LVIDs:         3.50 cm LV PW:         1.20 cm LV IVS:        1.10 cm LVOT diam:     2.20 cm LV SV:         61 LV SV Index:   26 LVOT Area:     3.80 cm  LV Volumes (MOD) LV vol d, MOD A2C: 133.0 ml LV vol d, MOD A4C: 119.0 ml LV vol s, MOD A2C: 66.5 ml LV vol s, MOD A4C: 84.6 ml LV SV MOD A2C:     66.5 ml LV SV MOD A4C:     119.0 ml LV SV MOD BP:      50.5 ml LEFT ATRIUM         Index LA diam:    3.90 cm 1.69 cm/m  AORTIC VALVE AV Area (Vmax):    3.04 cm AV Area (Vmean):   3.00 cm AV Area (VTI):     2.86 cm AV Vmax:           150.00 cm/s AV Vmean:          100.000 cm/s AV VTI:             0.213 m AV Peak Grad:      9.0 mmHg AV Mean Grad:      5.0 mmHg LVOT Vmax:         120.00 cm/s LVOT Vmean:        78.800 cm/s LVOT VTI:          0.160 m LVOT/AV VTI ratio: 0.75  AORTA Ao Root diam: 3.60 cm MITRAL VALVE MV Peak grad: 11.8 mmHg  SHUNTS MV Mean grad: 4.7 mmHg   Systemic VTI:  0.16 m MV Vmax:      1.72 m/s   Systemic Diam: 2.20 cm MV Vmean:     100.0 cm/s Mihai Croitoru MD Electronically signed by Sanda Klein MD Signature Date/Time: 04/13/2022/3:54:18 PM    Final      LOS: 15 days   Oren Binet, MD  [facility]    To contact the attending provider between 7A-7P or the covering provider during after hours 7P-7A, please log into the web site [email or web address] and access using universal [place] password for that web site. If you do not have the password, please call the hospital operator.  04/15/2022, 11:45 AM

## 2022-04-15 NOTE — Progress Notes (Signed)
Physical Therapy Treatment Patient Details Name: Christian [name] MRN: [id number] DOB: 1951-09-15 Today's Date: 04/15/2022   History of Present Illness 71 y.o. male presented 5/2 with worsening strength , new decubitus ulcers and poor intake.  Recent admission for cellulitis and hypoglycemia, had just returned to ALF from SNF a week ago but not OOB since then.  Has new hypoglyemia, elevation of K+, creatinine, CK.  Ulcer is not infected but has LE skin is also irritated and has skin breakdown. LEFt UE swollen and Doppler ordered 5/15 to r/o DVT.  PMHx:  chronic venous stasis and chronic right shin unhealing wound, chronic HFpEF, IDDM, CAD with stenting x2 in 2021, PVD, CKD stage IIIa, HTN, DVT on Xarelto, bipolar disorder, OSA on CPAP at bedtime    PT Comments    Pt remains difficult to mobilize. Attempted to get sitting EOB but unable with 2 person assist. Worked from chair position. Continue to recommend SNF at DC.    Recommendations for follow up therapy are one component of a multi-disciplinary discharge planning process, led by the attending physician.  Recommendations may be updated based on patient status, additional functional criteria and insurance authorization.  Follow Up Recommendations  Skilled nursing-short term rehab (<3 hours/day)     Assistance Recommended at Discharge Frequent or constant Supervision/Assistance  Patient can return home with the following Two people to help with walking and/or transfers;Assistance with cooking/housework;Assist for transportation;Help with stairs or ramp for entrance;Two people to help with bathing/dressing/bathroom;Assistance with feeding;Direct supervision/assist for medications management;Direct supervision/assist for financial management   Equipment Recommendations  Other (comment) (hoyer lift)    Recommendations for Other Services       Precautions / Restrictions Precautions Precautions: Fall Precaution Comments: monitor vitals, HR  and BP esp Restrictions Weight Bearing Restrictions: No     Mobility  Bed Mobility Overal bed mobility: Needs Assistance Bed Mobility: Rolling Rolling: +2 for physical assistance, Total assist         General bed mobility comments: Attempted supine to sitting EOB but even with 2 person assist could not achieve safely without putting [place] at very high risk of injury. Placed bed in chair position and required +2 max assist using bed pad behind trunk to bring pt's trunk forward.    Transfers                   General transfer comment: unable    Ambulation/Gait               General Gait Details: unable   Stairs             Wheelchair Mobility    Modified Rankin (Stroke Patients Only)       Balance                                            Cognition Arousal/Alertness: Awake/alert Behavior During Therapy: Flat affect Overall Cognitive Status: No family/caregiver present to determine baseline cognitive functioning                                 General Comments: Follows 1 step commands with incr time. Slow processing        Exercises General Exercises - Upper Extremity Shoulder Flexion: AAROM, Both, Supine, 10 reps General Exercises - Lower Extremity Short Arc Quad[place] Ship,  Both, 10 reps, Seated (chair position) Hip ABduction/ADduction: Both, Supine, AAROM, 10 reps    General Comments        Pertinent Vitals/Pain Pain Assessment Pain Assessment: Faces Faces Pain Scale: Hurts little more Pain Location: sacrum, generalized Pain Descriptors / Indicators: Grimacing, Guarding Pain Intervention(s): Limited activity within patient's tolerance, Repositioned    Home Living                          Prior Function            PT Goals (current goals can now be found in the care plan section) Progress towards PT goals: Not progressing toward goals - comment    Frequency    Min 2X/week       PT Plan Current plan remains appropriate    Co-evaluation              AM-PAC PT "6 Clicks" Mobility   Outcome Measure  Help needed turning from your back to your side while in a flat bed without using bedrails?: Total Help needed moving from lying on your back to sitting on the side of a flat bed without using bedrails?: Total Help needed moving to and from a bed to a chair (including a wheelchair)?: Total Help needed standing up from a chair using your arms (e.g., wheelchair or bedside chair)?: Total Help needed to walk in hospital room?: Total Help needed climbing 3-5 steps with a railing? : Total 6 Click Score: 6    End of Session   Activity Tolerance: Patient limited by pain Patient left: in bed;with call bell/phone within reach;with bed alarm set Nurse Communication: Mobility status PT Visit Diagnosis: Unsteadiness on feet (R26.81);History of falling (Z91.81);Repeated falls (R29.6);Difficulty in walking, not elsewhere classified (R26.2)     Time: [id number] PT Time Calculation (min) (ACUTE ONLY): 24 min  Charges:  $Therapeutic Activity: 23-37 mins                     [place] [facility] [place] 04/15/2022, 1:49 PM

## 2022-04-16 DIAGNOSIS — L89152 Pressure ulcer of sacral region, stage 2: Secondary | ICD-10-CM | POA: Diagnosis not present

## 2022-04-16 DIAGNOSIS — G9341 Metabolic encephalopathy: Secondary | ICD-10-CM

## 2022-04-16 DIAGNOSIS — R6520 Severe sepsis without septic shock: Secondary | ICD-10-CM | POA: Diagnosis not present

## 2022-04-16 DIAGNOSIS — E039 Hypothyroidism, unspecified: Secondary | ICD-10-CM

## 2022-04-16 DIAGNOSIS — N179 Acute kidney failure, unspecified: Secondary | ICD-10-CM

## 2022-04-16 DIAGNOSIS — I480 Paroxysmal atrial fibrillation: Secondary | ICD-10-CM

## 2022-04-16 DIAGNOSIS — A419 Sepsis, unspecified organism: Secondary | ICD-10-CM | POA: Diagnosis not present

## 2022-04-16 DIAGNOSIS — G252 Other specified forms of tremor: Secondary | ICD-10-CM

## 2022-04-16 DIAGNOSIS — N183 Chronic kidney disease, stage 3 unspecified: Secondary | ICD-10-CM

## 2022-04-16 DIAGNOSIS — R7881 Bacteremia: Secondary | ICD-10-CM | POA: Diagnosis not present

## 2022-04-16 DIAGNOSIS — E1122 Type 2 diabetes mellitus with diabetic chronic kidney disease: Secondary | ICD-10-CM

## 2022-04-16 DIAGNOSIS — M6282 Rhabdomyolysis: Secondary | ICD-10-CM

## 2022-04-16 DIAGNOSIS — I5030 Unspecified diastolic (congestive) heart failure: Secondary | ICD-10-CM

## 2022-04-16 LAB — CBC
HCT: 36.9 % — ABNORMAL LOW (ref 39.0–52.0)
Hemoglobin: 11.8 g/dL — ABNORMAL LOW (ref 13.0–17.0)
MCH: 26.7 pg (ref 26.0–34.0)
MCHC: 32 g/dL (ref 30.0–36.0)
MCV: 83.5 fL (ref 80.0–100.0)
Platelets: 245 10*3/uL (ref 150–400)
RBC: 4.42 MIL/uL (ref 4.22–5.81)
RDW: 17.7 % — ABNORMAL HIGH (ref 11.5–15.5)
WBC: 11.4 10*3/uL — ABNORMAL HIGH (ref 4.0–10.5)
nRBC: 0 % (ref 0.0–0.2)

## 2022-04-16 LAB — BASIC METABOLIC PANEL
Anion gap: 6 (ref 5–15)
BUN: 37 mg/dL — ABNORMAL HIGH (ref 8–23)
CO2: 23 mmol/L (ref 22–32)
Calcium: 9.1 mg/dL (ref 8.9–10.3)
Chloride: 113 mmol/L — ABNORMAL HIGH (ref 98–111)
Creatinine, Ser: 1.28 mg/dL — ABNORMAL HIGH (ref 0.61–1.24)
GFR, Estimated: >60 mL/min (ref >60)
Glucose, Bld: 120 mg/dL — ABNORMAL HIGH (ref 70–99)
Potassium: 3.6 mmol/L (ref 3.5–5.1)
Sodium: 142 mmol/L (ref 135–145)

## 2022-04-16 LAB — GLUCOSE, CAPILLARY
Glucose-Capillary: 92 mg/dL (ref 70–99)
Glucose-Capillary: 107 mg/dL — ABNORMAL HIGH (ref 70–99)
Glucose-Capillary: 156 mg/dL — ABNORMAL HIGH (ref 70–99)
Glucose-Capillary: 155 mg/dL — ABNORMAL HIGH (ref 70–99)

## 2022-04-16 NOTE — Progress Notes (Signed)
PROGRESS NOTE        PATIENT DETAILS Name: Christian [name] Age: 71 y.o. Sex: male Date of Birth: 07/24/1951 Admit Date: 03/30/2022 Admitting Physician Rise Patience, MD PCP:Sun, Gari Crown, MD  Brief Summary: Patient is a 71 y.o.  male with a history of A-fib on Xarelto, HFpEF, PAD, DM-2, bipolar disorder-presented with generalized weakness/sacral decubitus ulcer-was found to have hypoglycemia, hyperkalemia and subsequently admitted to the hospitalist service-see below for further details.  Thought to have extrapyramidal symptoms-evaluated by psych/neurology-psych medications adjusted with clinical improvement.  Unfortunately-further hospital course complicated by development of fever-due to probable Streptococcus bacteremia and infected decubitus ulcer.  See below for further details.  Significant events: 5/2>> admit to [facility] for weakness/hypoglycemia/hyperkalemia/rigidity-thought to have EPS 5/14>> febrile/tachycardic-Per nursing staff-discharge from prior existing sacral decubitus ulcer.  Started on broad-spectrum IV antibiotics.  Prelim blood culture positive for Streptococcus species. 5/19>> wound care starting hydrotherapy   Significant studies: 5/3>> x-ray pelvis: No fracture. 5/3>> CT head: No acute intracranial findings. 5/3>> x-ray left shoulder: Rotator cuff impingement 5/14>> CXR:No Obvious PNA 5/15>> left upper extremity Doppler: No DVT. 5/16>> CT abdomen/pelvis: Inferior sacral decubitus ulceration-no evidence of osteomyelitis. 5/16>> Echo: EF 45-50%, no obvious vegetations.  Significant microbiology data: 5/14>> blood culture: Streptococcus constellatus  5/15>> blood culture: No growth  Procedures:   Consults: Neurology, psychiatry, infectious disease  Subjective: Awake/alert-no major issues this morning.  Low-grade fever.  Objective: Vitals: Blood pressure 125/76, pulse 95, temperature (!) 100.4 F (38 C), temperature source Oral,  resp. rate 20, weight 109.6 kg, SpO2 94 %.   Exam: Gen Exam:Alert awake-not in any distress HEENT:atraumatic, normocephalic Chest: B/L clear to auscultation anteriorly CVS:S1S2 regular Abdomen:soft non tender, non distended Extremities:no edema-decrease in LUE swelling. Neurology: Non focal-but with generalized weakness. Skin: no rash   Pertinent Labs/Radiology:    Latest Ref Rng & Units 04/16/2022    2:34 AM 04/14/2022    1:17 AM 04/13/2022    7:30 AM  CBC  WBC 4.0 - 10.5 K/uL 11.4   10.0   10.0    Hemoglobin 13.0 - 17.0 g/dL 11.8   13.0   11.6    Hematocrit 39.0 - 52.0 % 36.9   41.7   36.9    Platelets 150 - 400 K/uL 245   251   239      Lab Results  Component Value Date   NA 142 04/16/2022   K 3.6 04/16/2022   CL 113 (H) 04/16/2022   CO2 23 04/16/2022      Assessment/Plan: Rigidity/tremors/mildly elevated CK: Felt to be more of extrapyramidal syndrome rather than NMS at this point.  Managed with supportive care.  After neurology/psych evaluation-has been resumed on Abilify and Cogentin-Wellbutrin/Seroquel has been discontinued.  AKI on CKD stage IIIa: AKI likely hemodynamically mediated-improved-creatinine close to baseline.   Mild rhabdomyolysis: Nontraumatic-improved with supportive care.  Sepsis due to possible strep bacteremia-sacral decubitus ulcer with infection: Sepsis physiology improving-continues to have low-grade fever-once afebrile switch from Dapto to Zyvox (end date 5/31) and continue Augmentin until 5/24.  CT abdomen on 5/16 without any sacral osteomyelitis-TTE without vegetation.  Reevaluated by wound care on 5/19-hydrotherapy to be started.    Acute metabolic encephalopathy: Due to sepsis--has significant amount of debility/deconditioning at baseline.    Hyperkalemia: Lokelma being continued at twice daily dosing.  When changed to once daily dosing-recurrent hyperkalemia.  Hypoglycemia: Resolved with supportive care.  Do not resume Amaryl on  discharge.  DM-2 (A1c 7.6 on 4/15): CBG relatively stable-avoiding tight glycemic control-at risk for hypoglycemia-continue Semglee 10 units and SSI.  Recent Labs    04/15/22 2118 04/16/22 0723 04/16/22 1217  GLUCAP 146* 92 107*     Hypothyroidism: Continue Cytomel.  PAF: Rate controlled-continue Xarelto  PAD: Follow-up with vascular surgery in the outpatient setting  HFpEF: Volume status stable-suspect has some amount of edema in lower extremities due to third spacing.  Demadex held due to sepsis physiology-should be able to resume over the next few days.  Bipolar disorder: Given concerns for EPS-on Abilify and Cogentin-Depakote/Wellbutrin and Seroquel held.  Left shoulder pain: Supportive care-likely rotator cuff injury.  Stable to be followed up by orthopedics in the outpatient setting  Left arm swelling: Likely due to IVF infiltration-could have superficial venous thrombosis-left upper extremity Doppler negative for DVT.  Supportive local care at this point.  OSA: CPAP nightly  Debility/deconditioning: Has had worsening of his chronic debility during this hospitalization-plans are of SNF on discharge.  Per family-he has been nonambulatory since his discharge from [facility] in April 2023.  This likely resulted in decubitus ulcer that the patient presented with.  Nutrition Status: Nutrition Problem: Increased nutrient needs Etiology: chronic illness, wound healing Signs/Symptoms: estimated needs Interventions: MVI, Hormel Shake  Pressure Ulcer: Pressure Injury 03/31/22 Sacrum Mid Stage 3 -  Full thickness tissue loss. Subcutaneous fat may be visible but bone, tendon or muscle are NOT exposed. (Active)  03/31/22 1933  Location: Sacrum  Location Orientation: Mid  Staging: Stage 3 -  Full thickness tissue loss. Subcutaneous fat may be visible but bone, tendon or muscle are NOT exposed.  Wound Description (Comments):   Present on Admission: Yes  Dressing Type Foam - Lift  dressing to assess site every shift 04/16/22 0746   Obesity: Estimated body mass index is 32.77 kg/m as calculated from the following:   Height as of 03/12/22: 6' (1.829 m).   Weight as of this encounter: 109.6 kg.   Code status:   Code Status: Full Code   DVT Prophylaxis: rivaroxaban (XARELTO) tablet 20 mg    Family Communication: Daughter-[name]-[phone]-updated on 5/18   Disposition Plan: Status is: Inpatient Remains inpatient appropriate because: Not stable for discharge-resolving sepsis physiology due to streptococcal bacteremia/infected sacral decubitus ulcer-continues to have low-grade fever-on IV daptomycin/Augmentin.  Hydrotherapy to be started on 5/19.   Planned Discharge Destination:Skilled nursing facility   Diet: Diet Order             DIET - DYS 1 Room service appropriate? Yes with Assist; Fluid consistency: Thin  Diet effective now                     Antimicrobial agents: Anti-infectives (From admission, onward)    Start     Dose/Rate Route Frequency Ordered Stop   04/15/22 0900  vancomycin (VANCOREADY) IVPB 1500 mg/300 mL  Status:  Discontinued        1,500 mg 150 mL/hr over 120 Minutes Intravenous Every 24 hours 04/14/22 0811 04/14/22 1005   04/14/22 2200  amoxicillin-clavulanate (AUGMENTIN) 875-125 MG per tablet 1 tablet        1 tablet Oral Every 12 hours 04/14/22 1331     04/14/22 1100  DAPTOmycin (CUBICIN) 700 mg in sodium chloride 0.9 % IVPB        8 mg/kg  90.4 kg (Adjusted) 128 mL/hr over 30 Minutes Intravenous  Daily 04/14/22 1005     04/14/22 0900  vancomycin (VANCOREADY) IVPB 2000 mg/400 mL  Status:  Discontinued        2,000 mg 200 mL/hr over 120 Minutes Intravenous  Once 04/14/22 0811 04/14/22 1005   04/13/22 2200  metroNIDAZOLE (FLAGYL) tablet 500 mg  Status:  Discontinued        500 mg Oral Every 12 hours 04/13/22 1436 04/14/22 1331   04/12/22 0815  cefTRIAXone (ROCEPHIN) 2 g in sodium chloride 0.9 % 100 mL IVPB   Status:  Discontinued        2 g 200 mL/hr over 30 Minutes Intravenous Daily 04/12/22 0717 04/14/22 0811   04/12/22 0600  vancomycin (VANCOREADY) IVPB 1250 mg/250 mL  Status:  Discontinued        1,250 mg 166.7 mL/hr over 90 Minutes Intravenous Every 24 hours 04/11/22 0521 04/12/22 0717   04/11/22 1145  metroNIDAZOLE (FLAGYL) IVPB 500 mg  Status:  Discontinued        500 mg 100 mL/hr over 60 Minutes Intravenous Every 12 hours 04/11/22 1055 04/13/22 1436   04/11/22 0600  ceFEPIme (MAXIPIME) 2 g in sodium chloride 0.9 % 100 mL IVPB  Status:  Discontinued        2 g 200 mL/hr over 30 Minutes Intravenous Every 8 hours 04/11/22 0437 04/12/22 0717   04/11/22 0530  vancomycin (VANCOREADY) IVPB 2000 mg/400 mL        2,000 mg 200 mL/hr over 120 Minutes Intravenous  Once 04/11/22 0437 04/11/22 2008        MEDICATIONS: Scheduled Meds:  amoxicillin-clavulanate  1 tablet Oral Q12H   ARIPiprazole  10 mg Oral q morning   benztropine  1 mg Oral BID   diclofenac Sodium  2 g Topical QID   insulin aspart  0-9 Units Subcutaneous TID WC   insulin glargine-yfgn  10 Units Subcutaneous Daily   leptospermum manuka honey  1 application. Topical Daily   liothyronine  10 mcg Oral Daily   mouth rinse  15 mL Mouth Rinse BID   multivitamin with minerals  1 tablet Oral Daily   rivaroxaban  20 mg Oral Q supper   senna-docusate  2 tablet Oral BID   sodium zirconium cyclosilicate  10 g Oral BID   Continuous Infusions:  sodium chloride 50 mL/hr at 04/15/22 1740   DAPTOmycin (CUBICIN)  IV 700 mg (04/15/22 2122)   PRN Meds:.acetaminophen **OR** acetaminophen, bisacodyl, HYDROcodone-acetaminophen, melatonin   I have personally reviewed following labs and imaging studies  LABORATORY DATA: CBC: Recent Labs  Lab 04/11/22 0104 04/12/22 0144 04/13/22 0730 04/14/22 0117 04/16/22 0234  WBC 10.2 9.8 10.0 10.0 11.4*  HGB 13.2 13.1 11.6* 13.0 11.8*  HCT 41.9 41.4 36.9* 41.7 36.9*  MCV 84.6 85.5 85.6 86.0  83.5  PLT 275 245 239 251 245     Basic Metabolic Panel: Recent Labs  Lab 04/12/22 0144 04/13/22 0730 04/14/22 0148 04/15/22 0157 04/16/22 0234  NA 134* 140 139 141 142  K 4.8 4.2 4.3 5.3* 3.6  CL 104 111 110 114* 113*  CO2 25 24 22  17* 23  GLUCOSE 105* 112* 156* 173* 120*  BUN 41* 36* 34* 37* 37*  CREATININE 1.47* 1.40* 1.21 1.39* 1.28*  CALCIUM 9.0 9.2 9.2 8.8* 9.1  MG  --   --  2.1  --   --      GFR: Estimated Creatinine Clearance: 70.6 mL/min (A) (by C-G formula based on SCr of 1.28 mg/dL (H)).  Liver  Function Tests: Recent Labs  Lab 04/12/22 0144 04/13/22 0730  AST 21 19  ALT 14 14  ALKPHOS 54 55  BILITOT 0.8 0.6  PROT 6.6 6.0*  ALBUMIN 2.0* 1.8*    No results for input(s): LIPASE, AMYLASE in the last 168 hours. No results for input(s): AMMONIA in the last 168 hours.  Coagulation Profile: No results for input(s): INR, PROTIME in the last 168 hours.  Cardiac Enzymes: Recent Labs  Lab 04/15/22 0157  CKTOTAL 63     BNP (last 3 results) No results for input(s): PROBNP in the last 8760 hours.  Lipid Profile: No results for input(s): CHOL, HDL, LDLCALC, TRIG, CHOLHDL, LDLDIRECT in the last 72 hours.  Thyroid Function Tests: No results for input(s): TSH, T4TOTAL, FREET4, T3FREE, THYROIDAB in the last 72 hours.  Anemia Panel: No results for input(s): VITAMINB12, FOLATE, FERRITIN, TIBC, IRON, RETICCTPCT in the last 72 hours.  Urine analysis:    Component Value Date/Time   COLORURINE YELLOW 04/11/2022 0540   APPEARANCEUR HAZY (A) 04/11/2022 0540   APPEARANCEUR Cloudy 08/01/2014 1614   LABSPEC 1.017 04/11/2022 0540   LABSPEC 1.008 08/01/2014 1614   PHURINE 7.0 04/11/2022 0540   GLUCOSEU >=500 (A) 04/11/2022 0540   GLUCOSEU Negative 08/01/2014 1614   HGBUR NEGATIVE 04/11/2022 0540   BILIRUBINUR NEGATIVE 04/11/2022 0540   BILIRUBINUR Negative 08/01/2014 1614   KETONESUR NEGATIVE 04/11/2022 0540   PROTEINUR NEGATIVE 04/11/2022 0540    UROBILINOGEN 0.2 07/01/2014 1809   NITRITE NEGATIVE 04/11/2022 0540   LEUKOCYTESUR NEGATIVE 04/11/2022 0540   LEUKOCYTESUR Negative 08/01/2014 1614    Sepsis Labs: Lactic Acid, Venous    Component Value Date/Time   LATICACIDVEN 1.8 04/11/2022 0651    MICROBIOLOGY: Recent Results (from the past 240 hour(s))  Culture, blood (Routine X 2) w Reflex to ID Panel     Status: Abnormal   Collection Time: 04/11/22  4:22 AM   Specimen: BLOOD RIGHT HAND  Result Value Ref Range Status   Specimen Description BLOOD RIGHT HAND  Final   Special Requests   Final    BOTTLES DRAWN AEROBIC ONLY Blood Culture adequate volume   Culture  Setup Time   Final    GRAM POSITIVE COCCI IN CHAINS AEROBIC BOTTLE ONLY CRITICAL RESULT CALLED TO, READ BACK BY AND VERIFIED WITH: T RUDISILL,PHARMD@[id number]  04/12/22 [place] Performed at [facility], [place] [address] [place], [place] [address]    Culture STREPTOCOCCUS CONSTELLATUS (A)  Final   Report Status 04/14/2022 FINAL  Final   Organism ID, Bacteria STREPTOCOCCUS CONSTELLATUS  Final      Susceptibility   Streptococcus constellatus - MIC*    PENICILLIN INTERMEDIATE Intermediate     CEFTRIAXONE 2 INTERMEDIATE Intermediate     ERYTHROMYCIN <=0.12 SENSITIVE Sensitive     LEVOFLOXACIN 0.5 SENSITIVE Sensitive     VANCOMYCIN 0.5 SENSITIVE Sensitive     * STREPTOCOCCUS CONSTELLATUS  Blood Culture ID Panel (Reflexed)     Status: Abnormal   Collection Time: 04/11/22  4:22 AM  Result Value Ref Range Status   Enterococcus faecalis NOT DETECTED NOT DETECTED Final   Enterococcus Faecium NOT DETECTED NOT DETECTED Final   Listeria monocytogenes NOT DETECTED NOT DETECTED Final   Staphylococcus species NOT DETECTED NOT DETECTED Final   Staphylococcus aureus (BCID) NOT DETECTED NOT DETECTED Final   Staphylococcus epidermidis NOT DETECTED NOT DETECTED Final   Staphylococcus lugdunensis NOT DETECTED NOT DETECTED Final   Streptococcus species DETECTED (A) NOT DETECTED Final     Comment: Not  Enterococcus species, Streptococcus agalactiae, Streptococcus pyogenes, or Streptococcus pneumoniae. CRITICAL RESULT CALLED TO, READ BACK BY AND VERIFIED WITH: T RUDISILL,PHARMD@[id number]  04/12/22 [place]    Streptococcus agalactiae NOT DETECTED NOT DETECTED Final   Streptococcus pneumoniae NOT DETECTED NOT DETECTED Final   Streptococcus pyogenes NOT DETECTED NOT DETECTED Final   A.calcoaceticus-baumannii NOT DETECTED NOT DETECTED Final   Bacteroides fragilis NOT DETECTED NOT DETECTED Final   Enterobacterales NOT DETECTED NOT DETECTED Final   Enterobacter cloacae complex NOT DETECTED NOT DETECTED Final   Escherichia coli NOT DETECTED NOT DETECTED Final   Klebsiella aerogenes NOT DETECTED NOT DETECTED Final   Klebsiella oxytoca NOT DETECTED NOT DETECTED Final   Klebsiella pneumoniae NOT DETECTED NOT DETECTED Final   Proteus species NOT DETECTED NOT DETECTED Final   Salmonella species NOT DETECTED NOT DETECTED Final   Serratia marcescens NOT DETECTED NOT DETECTED Final   Haemophilus influenzae NOT DETECTED NOT DETECTED Final   Neisseria meningitidis NOT DETECTED NOT DETECTED Final   Pseudomonas aeruginosa NOT DETECTED NOT DETECTED Final   Stenotrophomonas maltophilia NOT DETECTED NOT DETECTED Final   Candida albicans NOT DETECTED NOT DETECTED Final   Candida auris NOT DETECTED NOT DETECTED Final   Candida glabrata NOT DETECTED NOT DETECTED Final   Candida krusei NOT DETECTED NOT DETECTED Final   Candida parapsilosis NOT DETECTED NOT DETECTED Final   Candida tropicalis NOT DETECTED NOT DETECTED Final   Cryptococcus neoformans/gattii NOT DETECTED NOT DETECTED Final    Comment: Performed at [facility], [address], [place], [place] [address]  Culture, blood (Routine X 2) w Reflex to ID Panel     Status: Abnormal   Collection Time: 04/11/22  4:38 AM   Specimen: BLOOD  Result Value Ref Range Status   Specimen Description BLOOD RIGHT ANTECUBITAL  Final   Special Requests    Final    BOTTLES DRAWN AEROBIC ONLY Blood Culture adequate volume   Culture  Setup Time   Final    GRAM POSITIVE COCCI IN CHAINS AEROBIC BOTTLE ONLY CRITICAL VALUE NOTED.  VALUE IS CONSISTENT WITH PREVIOUSLY REPORTED AND CALLED VALUE.    Culture (A)  Final    STREPTOCOCCUS CONSTELLATUS SUSCEPTIBILITIES PERFORMED ON PREVIOUS CULTURE WITHIN THE LAST 5 DAYS. Performed at [facility], [place] [address], [place], [place] [address]    Report Status 04/14/2022 FINAL  Final  Culture, blood (Routine X 2) w Reflex to ID Panel     Status: None (Preliminary result)   Collection Time: 04/12/22 12:20 PM   Specimen: BLOOD  Result Value Ref Range Status   Specimen Description BLOOD RIGHT ANTECUBITAL  Final   Special Requests   Final    BOTTLES DRAWN AEROBIC AND ANAEROBIC Blood Culture adequate volume   Culture   Final    NO GROWTH 4 DAYS Performed at [facility], [place] [address] [place], [place] [address]    Report Status PENDING  Incomplete  Culture, blood (Routine X 2) w Reflex to ID Panel     Status: None (Preliminary result)   Collection Time: 04/12/22 12:22 PM   Specimen: BLOOD LEFT HAND  Result Value Ref Range Status   Specimen Description BLOOD LEFT HAND  Final   Special Requests   Final    BOTTLES DRAWN AEROBIC ONLY Blood Culture results may not be optimal due to an inadequate volume of blood received in culture bottles   Culture   Final    NO GROWTH 4 DAYS Performed at [facility], [address], [place], [place] [address]    Report Status PENDING  Incomplete    RADIOLOGY STUDIES/RESULTS: No results found.   LOS: 16 days   Oren Binet, MD  [facility]    To contact the attending provider between 7A-7P or the covering provider during after hours 7P-7A, please log into the web site [email or web address] and access using universal [place] password for that web site. If you do not have the password, please call the hospital operator.  04/16/2022, 2:37  PM

## 2022-04-16 NOTE — Consult Note (Addendum)
WOC Nurse wound follow up Patient receiving care in [facility] [id number]. NT assisting with turning for wound assessment Wound type: MASD- evolved into DTPI to sacral/coccyx area now an unstageable wound Measurement: 4.8 cm x 5 cm x unknown depth Wound bed: wet, tan/grey slough Drainage (amount, consistency, odor) tan with foul odor Periwound: Some superficial skin breakdown, pink in color Dressing procedure/placement/frequency: PT to perform on the days of hydrotherapy, primary nurse to do on days PT not performing hydrotherapy. Cleanse buttocks/sacral wound with NS and ptr dry.  Then apply a nickel thick layer of MediHoney directly to the wound or onto a dressing. Make certain that the dressing covers the entire wound base, but not in contact with the peri-wound skin. then cover with silicone foam dressing. Change dressing daily.  I have requested PT for hydrotherapy 6 days/week.  Helmut Muster, RN, MSN, CWOCN, CNS-BC, pager [phone]

## 2022-04-16 NOTE — Progress Notes (Signed)
Christian [name] for Infectious Disease  Date of Admission:  03/30/2022     Abx: 5/17-c daptomycin 5/17-c amox-clav  5/15-17 ceftriaxone 5/14-17 metronidazole 5/14 vanc, cefepime                                                         Assessment: 71 y.o. male snf resident, pad, bipolar, HFpEF, ckd, dm2, osa, afib admitted from snf for failure to thrive, with sacral decub ulcer present on admission, course complicated by [facility] with strep constellatus in setting of increased disharge from sacral ulcer   Hob strep constellatus (I pcn, ceftriaxone) Sacral decub -question of increasing sacral decub discharge -only has peripheral iv's and on admission LUE swelling but no sign of insertion site cellulitis -strep constellatus known to cause metastatic abscess and also high burden concerning for seeding heart valve -source GI vs decub vs skin   ct abd/pelv no abscess  Limited tte no obvious vegetation; given quick derfescece of bacteremia and low handoc score, deferring tee 5/15 repeat bcx ngtd   --------- 5/19 assessment Said he feels well but rather appear debilitated; query if this is his baseline. He has been like this for a few years per his report. Doesn't appear able to feed himself  Fever curve improving on dapto/amox-clav 5/18 reviewed sacral ulcer appearance -- no purulence or cellulitis changes  Would finish 2 week strep constellatus bsi treatment and 10 day course for sacral ulcer cellulitis tx  Of note, rather unusual for [facility]GS bsi that had demonstrated bcx clearance to still have fever. He is on psychotropic meds but no sign nms/serotonin. Abd ct above had not seen any occult abscess. Low suspicion endocarditis. Will need outpatient id f/u post treatment for monitoring    Plan: Continue daptomycin and amox-clav for now Once fever resolve will switch dapto to linezolid Finish dapto-->linezolid on 5/31 Finish amox-clav 5/24 Discussed with primary team   I  spent more than 35 minute reviewing data/chart, and coordinating care and >50% direct face to face time providing counseling/discussing diagnostics/treatment plan with patient  Principal Problem:   Hyperkalemia Active Problems:   Bacteremia   Diabetes mellitus type 2 in nonobese (HCC)   Sacral decubitus ulcer, stage II ([place])   Severe sepsis without septic shock (HCC)   Hypoglycemia   Allergies  Allergen Reactions   Amlodipine Swelling and Other (See Comments)    Legs became swollen   Onglyza [Saxagliptin] Other (See Comments)    "Allergic," per [place]   Zyprexa [Olanzapine] Other (See Comments)    Allergy noted by ACT Team. No reaction specified (??)- "Allergic," per [place]   Ritalin [Methylphenidate] Anxiety and Other (See Comments)    "Man[place]"    Scheduled Meds:  amoxicillin-clavulanate  1 tablet Oral Q12H   ARIPiprazole  10 mg Oral q morning   benztropine  1 mg Oral BID   diclofenac Sodium  2 g Topical QID   insulin aspart  0-9 Units Subcutaneous TID WC   insulin glargine-yfgn  10 Units Subcutaneous Daily   leptospermum manuka honey  1 application. Topical Daily   liothyronine  10 mcg Oral Daily   mouth rinse  15 mL Mouth Rinse BID   multivitamin with minerals  1 tablet Oral Daily   rivaroxaban  20  mg Oral Q supper   senna-docusate  2 tablet Oral BID   sodium zirconium cyclosilicate  10 g Oral BID   Continuous Infusions:  sodium chloride 50 mL/hr at 04/15/22 1740   DAPTOmycin (CUBICIN)  IV 700 mg (04/15/22 2122)   PRN Meds:.acetaminophen **OR** acetaminophen, bisacodyl, HYDROcodone-acetaminophen, melatonin   SUBJECTIVE: No complaint Feels well Very weak overall but baseline for a few years per his report (from mini stroke)  Fever curve improving No n/v/diarrhea    Review of Systems: ROS All other ROS was negative, except mentioned above     OBJECTIVE: Vitals:   04/16/22 0330 04/16/22 0720 04/16/22 0922 04/16/22 1212  BP:  113/61 113/71 127/80 125/76  Pulse: 95 95    Resp: (!) 22 (!) 21 20 20   Temp: 98.8 F (37.1 C) (!) 100.6 F (38.1 C) 100.3 F (37.9 C) (!) 100.4 F (38 C)  TempSrc: Oral Axillary Axillary Oral  SpO2: 95% 94%    Weight:       Body mass index is 32.77 kg/m.  Physical Exam  General/constitutional: no distress, pleasant; conversant but slow  HEENT: Normocephalic, PER, Conj Clear, EOMI, Oropharynx clear Neck supple CV: rrr no mrg Lungs: clear to auscultation, normal respiratory effort Abd: Soft, Nontender  Ext: trace ext edema Skin: No Rash -- fungotic toe nail changes Neuro: generalized weakness -- able to lift arms off bed and sustained at least several seconds; no rigidity    Lab Results Lab Results  Component Value Date   WBC 11.4 (H) 04/16/2022   HGB 11.8 (L) 04/16/2022   HCT 36.9 (L) 04/16/2022   MCV 83.5 04/16/2022   PLT 245 04/16/2022    Lab Results  Component Value Date   CREATININE 1.28 (H) 04/16/2022   BUN 37 (H) 04/16/2022   NA 142 04/16/2022   K 3.6 04/16/2022   CL 113 (H) 04/16/2022   CO2 23 04/16/2022    Lab Results  Component Value Date   ALT 14 04/13/2022   AST 19 04/13/2022   ALKPHOS 55 04/13/2022   BILITOT 0.6 04/13/2022      Microbiology: Recent Results (from the past 240 hour(s))  Culture, blood (Routine X 2) w Reflex to ID Panel     Status: Abnormal   Collection Time: 04/11/22  4:22 AM   Specimen: BLOOD RIGHT HAND  Result Value Ref Range Status   Specimen Description BLOOD RIGHT HAND  Final   Special Requests   Final    BOTTLES DRAWN AEROBIC ONLY Blood Culture adequate volume   Culture  Setup Time   Final    GRAM POSITIVE COCCI IN CHAINS AEROBIC BOTTLE ONLY CRITICAL RESULT CALLED TO, READ BACK BY AND VERIFIED WITH: T RUDISILL,[id number]  04/12/22 [place] Performed at [facility], [address], [place], [place] [address]    Culture STREPTOCOCCUS CONSTELLATUS (A)  Final   Report Status 04/14/2022 FINAL  Final   Organism ID,  Bacteria STREPTOCOCCUS CONSTELLATUS  Final      Susceptibility   Streptococcus constellatus - MIC*    PENICILLIN INTERMEDIATE Intermediate     CEFTRIAXONE 2 INTERMEDIATE Intermediate     ERYTHROMYCIN <=0.12 SENSITIVE Sensitive     LEVOFLOXACIN 0.5 SENSITIVE Sensitive     VANCOMYCIN 0.5 SENSITIVE Sensitive     * STREPTOCOCCUS CONSTELLATUS  Blood Culture ID Panel (Reflexed)     Status: Abnormal   Collection Time: 04/11/22  4:22 AM  Result Value Ref Range Status   Enterococcus faecalis NOT DETECTED NOT DETECTED Final  Enterococcus Faecium NOT DETECTED NOT DETECTED Final   Listeria monocytogenes NOT DETECTED NOT DETECTED Final   Staphylococcus species NOT DETECTED NOT DETECTED Final   Staphylococcus aureus (BCID) NOT DETECTED NOT DETECTED Final   Staphylococcus epidermidis NOT DETECTED NOT DETECTED Final   Staphylococcus lugdunensis NOT DETECTED NOT DETECTED Final   Streptococcus species DETECTED (A) NOT DETECTED Final    Comment: Not Enterococcus species, Streptococcus agalactiae, Streptococcus pyogenes, or Streptococcus pneumoniae. CRITICAL RESULT CALLED TO, READ BACK BY AND VERIFIED WITH: T RUDISILL,PHARMD@[id number]  04/12/22 [place]    Streptococcus agalactiae NOT DETECTED NOT DETECTED Final   Streptococcus pneumoniae NOT DETECTED NOT DETECTED Final   Streptococcus pyogenes NOT DETECTED NOT DETECTED Final   A.calcoaceticus-baumannii NOT DETECTED NOT DETECTED Final   Bacteroides fragilis NOT DETECTED NOT DETECTED Final   Enterobacterales NOT DETECTED NOT DETECTED Final   Enterobacter cloacae complex NOT DETECTED NOT DETECTED Final   Escherichia coli NOT DETECTED NOT DETECTED Final   Klebsiella aerogenes NOT DETECTED NOT DETECTED Final   Klebsiella oxytoca NOT DETECTED NOT DETECTED Final   Klebsiella pneumoniae NOT DETECTED NOT DETECTED Final   Proteus species NOT DETECTED NOT DETECTED Final   Salmonella species NOT DETECTED NOT DETECTED Final   Serratia marcescens NOT DETECTED NOT DETECTED  Final   Haemophilus influenzae NOT DETECTED NOT DETECTED Final   Neisseria meningitidis NOT DETECTED NOT DETECTED Final   Pseudomonas aeruginosa NOT DETECTED NOT DETECTED Final   Stenotrophomonas maltophilia NOT DETECTED NOT DETECTED Final   Candida albicans NOT DETECTED NOT DETECTED Final   Candida auris NOT DETECTED NOT DETECTED Final   Candida glabrata NOT DETECTED NOT DETECTED Final   Candida krusei NOT DETECTED NOT DETECTED Final   Candida parapsilosis NOT DETECTED NOT DETECTED Final   Candida tropicalis NOT DETECTED NOT DETECTED Final   Cryptococcus neoformans/gattii NOT DETECTED NOT DETECTED Final    Comment: Performed at [facility], [address], [place], [place] [address]  Culture, blood (Routine X 2) w Reflex to ID Panel     Status: Abnormal   Collection Time: 04/11/22  4:38 AM   Specimen: BLOOD  Result Value Ref Range Status   Specimen Description BLOOD RIGHT ANTECUBITAL  Final   Special Requests   Final    BOTTLES DRAWN AEROBIC ONLY Blood Culture adequate volume   Culture  Setup Time   Final    GRAM POSITIVE COCCI IN CHAINS AEROBIC BOTTLE ONLY CRITICAL VALUE NOTED.  VALUE IS CONSISTENT WITH PREVIOUSLY REPORTED AND CALLED VALUE.    Culture (A)  Final    STREPTOCOCCUS CONSTELLATUS SUSCEPTIBILITIES PERFORMED ON PREVIOUS CULTURE WITHIN THE LAST 5 DAYS. Performed at [facility], [place] [address], [place], [place] [address]    Report Status 04/14/2022 FINAL  Final  Culture, blood (Routine X 2) w Reflex to ID Panel     Status: None (Preliminary result)   Collection Time: 04/12/22 12:20 PM   Specimen: BLOOD  Result Value Ref Range Status   Specimen Description BLOOD RIGHT ANTECUBITAL  Final   Special Requests   Final    BOTTLES DRAWN AEROBIC AND ANAEROBIC Blood Culture adequate volume   Culture   Final    NO GROWTH 4 DAYS Performed at [facility], [place] [address] [place], [place] [address]    Report Status PENDING  Incomplete  Culture, blood  (Routine X 2) w Reflex to ID Panel     Status: None (Preliminary result)   Collection Time: 04/12/22 12:22 PM   Specimen:  BLOOD LEFT HAND  Result Value Ref Range Status   Specimen Description BLOOD LEFT HAND  Final   Special Requests   Final    BOTTLES DRAWN AEROBIC ONLY Blood Culture results may not be optimal due to an inadequate volume of blood received in culture bottles   Culture   Final    NO GROWTH 4 DAYS Performed at [facility], [place] [address], [place], [place] [address]    Report Status PENDING  Incomplete     Serology:   Imaging: If present, new imagings (plain films, ct scans, and mri) have been personally visualized and interpreted; radiology reports have been reviewed. Decision making incorporated into the Impression / Recommendations.   5/16 abd pelv ct with contrast 1. Inferior sacral decubitus ulceration with underlying ill-defined subcutaneous fluid and soft tissue emphysema consistent with soft tissue infection. No evidence of rectal fistula or sacrococcygeal osteomyelitis. 2. No intra-abdominal fluid collections, ascites or free air. The appendix appears normal. 3. Prominent stool throughout the colon. 4. 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 5. Coronary and Aortic Atherosclerosis   5/16 tte Conclusion(s)/Recommendation(s): No evidence of valvular vegetations on  this transthoracic echocardiogram. Consider a transesophageal  echocardiogram to exclude infective endocarditis if clinically indicated.   Jabier Mutton, [place] [facility] [place] [phone] pager    04/16/2022, 12:40 PM

## 2022-04-16 NOTE — Progress Notes (Addendum)
Christian [name] is a 71 y.o. male patient. Dressing changed twice to patient sacrum this shift. See dressing orders for further detail. Noted to have foul odor to sacral wound. No complaints of pain have been made. Feeding assistance provided. Q2 turns assisted with patient transferred to newly ordered bed. Continuous IV fluids at 54mL/hr NS provided per MD order. PO AUGMENTIN given. Current potassium level is 3.72mmol/L.  1. Elevated CK   2. Failure to thrive in adult   3. PAD (peripheral artery disease) (HCC)   4. Hyperkalemia   5. Hypoglycemia    Past Medical History:  Diagnosis Date   Bilateral lower extremity edema: chronic with venous stasis changes 06/02/2014   Bipolar 1 disorder (HCC)    Bipolar disorder (HCC)    CHF (congestive heart failure) (HCC)    Chronic kidney disease    Gout    Gout    Hx of blood clots    Hypertension    Mood swings    OSA on CPAP 06/02/2014   Other and unspecified hyperlipidemia 06/02/2014   Type II or unspecified type diabetes mellitus with unspecified complication, uncontrolled 06/02/2014   Current Facility-Administered Medications  Medication Dose Route Frequency Provider Last Rate Last Admin   0.9 %  sodium chloride infusion   Intravenous Continuous Maretta Bees, MD 50 mL/hr at 04/15/22 1740 New Bag at 04/15/22 1740   acetaminophen (TYLENOL) tablet 650 mg  650 mg Oral Q6H PRN Eduard Clos, MD   650 mg at 04/14/22 2315   Or   acetaminophen (TYLENOL) suppository 650 mg  650 mg Rectal Q6H PRN Eduard Clos, MD   650 mg at 04/11/22 1747   amoxicillin-clavulanate (AUGMENTIN) 875-125 MG per tablet 1 tablet  1 tablet Oral Q12H Vu, Trung T, MD   1 tablet at 04/16/22 1219   ARIPiprazole (ABILIFY) tablet 10 mg  10 mg Oral q morning Cinderella, Margaret A   10 mg at 04/16/22 1223   benztropine (COGENTIN) tablet 1 mg  1 mg Oral BID Elgergawy, Leana Roe, MD   1 mg at 04/16/22 1224   bisacodyl (DULCOLAX) EC tablet 10 mg  10 mg Oral Daily PRN  Elgergawy, Leana Roe, MD   10 mg at 04/03/22 1627   DAPTOmycin (CUBICIN) 700 mg in sodium chloride 0.9 % IVPB  8 mg/kg (Adjusted) Intravenous Q2000 Vu, Trung T, MD 128 mL/hr at 04/15/22 2122 700 mg at 04/15/22 2122   diclofenac Sodium (VOLTAREN) 1 % topical gel 2 g  2 g Topical QID Maretta Bees, MD   2 g at 04/16/22 1223   HYDROcodone-acetaminophen (NORCO/VICODIN) 5-325 MG per tablet 1 tablet  1 tablet Oral Q4H PRN Elgergawy, Leana Roe, MD   1 tablet at 04/14/22 1726   insulin aspart (novoLOG) injection 0-9 Units  0-9 Units Subcutaneous TID WC Elgergawy, Leana Roe, MD   3 Units at 04/15/22 1736   insulin glargine-yfgn (SEMGLEE) injection 10 Units  10 Units Subcutaneous Daily Maretta Bees, MD   10 Units at 04/16/22 1222   leptospermum manuka honey (MEDIHONEY) paste 1 application.  1 application. Topical Daily Ghimire, Werner Lean, MD   1 application. at 04/16/22 1005   liothyronine (CYTOMEL) tablet 10 mcg  10 mcg Oral Daily Eduard Clos, MD   10 mcg at 04/16/22 1220   MEDLINE mouth rinse  15 mL Mouth Rinse BID Maretta Bees, MD   15 mL at 04/16/22 1221   melatonin tablet 10 mg  10 mg  Oral QHS PRN Marinda Elk, MD   10 mg at 04/13/22 2135   multivitamin with minerals tablet 1 tablet  1 tablet Oral Daily Elgergawy, Leana Roe, MD   1 tablet at 04/16/22 1219   rivaroxaban (XARELTO) tablet 20 mg  20 mg Oral Q supper Eduard Clos, MD   20 mg at 04/15/22 1735   senna-docusate (Senokot-S) tablet 2 tablet  2 tablet Oral BID Elgergawy, Leana Roe, MD   2 tablet at 04/16/22 1219   sodium zirconium cyclosilicate (LOKELMA) packet 10 g  10 g Oral BID Maretta Bees, MD   10 g at 04/16/22 1220   Allergies  Allergen Reactions   Amlodipine Swelling and Other (See Comments)    Legs became swollen   Onglyza [Saxagliptin] Other (See Comments)    "Allergic," per [facility]   Zyprexa [Olanzapine] Other (See Comments)    Allergy noted by ACT Team. No reaction specified (??)-  "Allergic," per [facility]   Ritalin [Methylphenidate] Anxiety and Other (See Comments)    "Maniac"   Principal Problem:   Hyperkalemia Active Problems:   Bacteremia   Diabetes mellitus type 2 in nonobese (HCC)   Sacral decubitus ulcer, stage II (HCC)   Severe sepsis without septic shock (HCC)   Hypoglycemia  Blood pressure 125/76, pulse 95, temperature (!) 100.4 F (38 C), temperature source Oral, resp. rate 20, weight 109.6 kg, SpO2 94 %.  Subjective Objective: Vital signs: (most recent): Blood pressure 125/76, pulse 95, temperature (!) 100.4 F (38 C), temperature source Oral, resp. rate 20, weight 109.6 kg, SpO2 94 %.   Assessment & Plan  Tylesha Gibeault A Lucianne Smestad 04/16/2022

## 2022-04-16 NOTE — TOC Progression Note (Signed)
Transition of Care [facility]) - Progression Note    Patient Details  Name: MONTERRIUS CARDOSA MRN: [id number] Date of Birth: 30-Mar-1951  [facility]) CM/SW Contact  Mearl Latin, LCSW Phone Number: 04/16/2022, 4:39 PM  Clinical Narrative:    [facility] has received insurance approval. Will continue to follow for medical readiness.    Expected Discharge Plan: Skilled Nursing Facility Barriers to Discharge: Continued Medical Work up  Expected Discharge Plan and Services Expected Discharge Plan: Skilled Nursing Facility In-house Referral: Clinical Social Work   Post Acute Care Choice: Skilled Nursing Facility Living arrangements for the past 2 months: Single Family Home, Skilled Nursing Facility                                       Social Determinants of Health (SDOH) Interventions    Readmission Risk Interventions    04/13/2022    3:06 PM 04/13/2022   10:43 AM 01/28/2022   10:17 AM  Readmission Risk Prevention Plan  Transportation Screening  Complete Complete  PCP or Specialist Appt within 5-7 Days   Complete  PCP or Specialist Appt within 3-5 Days Complete    Home Care Screening   Complete  Medication Review (RN CM)   Complete  HRI or Home Care Consult Complete    Social Work Consult for Recovery Care Planning/Counseling Complete    Palliative Care Screening Not Applicable    Medication Review [facility]) Referral to Pharmacy Complete   PCP or Specialist appointment within 3-5 days of discharge  Complete   HRI or Home Care Consult  Complete   SW Recovery Care/Counseling Consult  Complete   Palliative Care Screening  Not Applicable   Skilled Nursing Facility  Complete

## 2022-04-16 NOTE — Progress Notes (Signed)
   04/15/22 1922  Assess: MEWS Score  Temp 98.6 F (37 C)  BP 112/77  Pulse Rate 84  ECG Heart Rate (!) 108  Resp (!) 23  Level of Consciousness Alert  SpO2 92 %  O2 Device Room Air  Patient Activity (if Appropriate) In bed  Assess: MEWS Score  MEWS Temp 0  MEWS Systolic 0  MEWS Pulse 1  MEWS RR 1  MEWS LOC 0  MEWS Score 2  MEWS Score Color Yellow  Assess: if the MEWS score is Yellow or Red  Were vital signs taken at a resting state? Yes  Focused Assessment No change from prior assessment  Early Detection of Sepsis Score *See Row Information* High  MEWS guidelines implemented *See Row Information* Yes  Treat  MEWS Interventions Other (Comment) (rechecked V/S, notified Dr and Donnetta Hutching)  Pain Scale Faces  Pain Score 0  Faces Pain Scale 0  Take Vital Signs  Increase Vital Sign Frequency  Yellow: Q 2hr X 2 then Q 4hr X 2, if remains yellow, continue Q 4hrs  Notify: Charge Nurse/RN  Name of Charge Nurse/RN Notified [place], RN  Notify: Provider  Provider Name/Title Dow Adolph  Date Provider Notified 04/16/22  Time Provider Notified 2005  Method of Notification Page  Notification Reason Other (Comment) (yellow mews)  Provider response No new orders  Date of Provider Response 04/15/22  Time of Provider Response 2008  Document  Progress note created (see row info) Yes

## 2022-04-17 DIAGNOSIS — E875 Hyperkalemia: Secondary | ICD-10-CM | POA: Diagnosis not present

## 2022-04-17 LAB — GLUCOSE, CAPILLARY
Glucose-Capillary: 126 mg/dL — ABNORMAL HIGH (ref 70–99)
Glucose-Capillary: 163 mg/dL — ABNORMAL HIGH (ref 70–99)
Glucose-Capillary: 164 mg/dL — ABNORMAL HIGH (ref 70–99)
Glucose-Capillary: 142 mg/dL — ABNORMAL HIGH (ref 70–99)

## 2022-04-17 LAB — CULTURE, BLOOD (ROUTINE X 2)
Culture: NO GROWTH
Culture: NO GROWTH
Special Requests: ADEQUATE

## 2022-04-17 LAB — APTT
aPTT: 33 seconds (ref 24–36)
aPTT: 63 seconds — ABNORMAL HIGH (ref 24–36)

## 2022-04-17 LAB — PROCALCITONIN: Procalcitonin: 0.27 ng/mL

## 2022-04-17 LAB — HEPARIN LEVEL (UNFRACTIONATED)
Heparin Unfractionated: 1.06 IU/mL — ABNORMAL HIGH (ref 0.30–0.70)
Heparin Unfractionated: 0.65 IU/mL (ref 0.30–0.70)

## 2022-04-17 LAB — MRSA NEXT GEN BY PCR, NASAL: MRSA by PCR Next Gen: NOT DETECTED

## 2022-04-17 MED ORDER — CHLORHEXIDINE GLUCONATE CLOTH 2 % EX PADS
6.0000 | MEDICATED_PAD | Freq: Every day | CUTANEOUS | Status: DC
  Administered 2022-04-17 – 2022-05-20 (×33): 6 via TOPICAL

## 2022-04-17 MED ORDER — HEPARIN (PORCINE) 25000 UT/250ML-% IV SOLN
1800.0000 [IU]/h | INTRAVENOUS | Status: DC
  Administered 2022-04-17: 1500 [IU]/h via INTRAVENOUS
  Administered 2022-04-18: 1800 [IU]/h via INTRAVENOUS
  Filled 2022-04-17 (×2): qty 250

## 2022-04-17 MED ORDER — DAKINS (1/4 STRENGTH) 0.125 % EX SOLN
Freq: Two times a day (BID) | CUTANEOUS | Status: AC
  Filled 2022-04-17: qty 473

## 2022-04-17 NOTE — TOC Progression Note (Signed)
Transition of Care [facility]) - Progression Note    Patient Details  Name: SAAFIR ABDULLAH MRN: [id number] Date of Birth: 1951-01-07  Transition of Care [facility]) CM/SW Contact  Lorri Frederick, LCSW Phone Number: 04/17/2022, 2:56 PM  Clinical Narrative:   New passr screening put in [place] Must.  30 day note awaiting MD signature.  PASSR docs not uploaded    Expected Discharge Plan: Skilled Nursing Facility Barriers to Discharge: Continued Medical Work up  Expected Discharge Plan and Services Expected Discharge Plan: Skilled Nursing Facility In-house Referral: Clinical Social Work   Post Acute Care Choice: Skilled Nursing Facility Living arrangements for the past 2 months: Single Family Home, Skilled Nursing Facility                                       Social Determinants of Health (SDOH) Interventions    Readmission Risk Interventions    04/13/2022    3:06 PM 04/13/2022   10:43 AM 01/28/2022   10:17 AM  Readmission Risk Prevention Plan  Transportation Screening  Complete Complete  PCP or Specialist Appt within 5-7 Days   Complete  PCP or Specialist Appt within 3-5 Days Complete    Home Care Screening   Complete  Medication Review (RN CM)   Complete  HRI or Home Care Consult Complete    Social Work Consult for Recovery Care Planning/Counseling Complete    Palliative Care Screening Not Applicable    Medication Review Ocean[facility]) Referral to Pharmacy Complete   PCP or Specialist appointment within 3-5 days of discharge  Complete   HRI or Home Care Consult  Complete   SW Recovery Care/Counseling Consult  Complete   Palliative Care Screening  Not Applicable   Skilled Nursing Facility  Complete

## 2022-04-17 NOTE — Progress Notes (Signed)
    RE:   Christian [name]      Date of Birth:  2051/08/26     Date:   04/17/22       To Whom It May Concern:  Please be advised that the above-named patient will require a short-term nursing home stay - anticipated 30 days or less for rehabilitation and strengthening.  The plan is for return home.                 MD signature                Date

## 2022-04-17 NOTE — Progress Notes (Signed)
ANTICOAGULATION CONSULT NOTE - Initial Consult  Pharmacy Consult for heparin dosing Indication: atrial fibrillation  Allergies  Allergen Reactions   Amlodipine Swelling and Other (See Comments)    Legs became swollen   Onglyza [Saxagliptin] Other (See Comments)    "Allergic," per [place]   Zyprexa [Olanzapine] Other (See Comments)    Allergy noted by ACT Team. No reaction specified (??)- "Allergic," per [place]   Ritalin [Methylphenidate] Anxiety and Other (See Comments)    "[place]"    Patient Measurements: Height: 6' (182.9 cm) Weight: 109.8 kg (242 lb 1 oz) IBW/kg (Calculated) : 77.6 Heparin Dosing Weight: 100.8kg  Vital Signs: Temp: 98.6 F (37 C) (05/20 1200) Temp Source: Oral (05/20 1200) BP: 114/69 (05/20 1200) Pulse Rate: 105 (05/20 1200)  Labs: Recent Labs    04/15/22 0157 04/16/22 0234  HGB  --  11.8*  HCT  --  36.9*  PLT  --  245  CREATININE 1.39* 1.28*  CKTOTAL 63  --     Estimated Creatinine Clearance: 70.7 mL/min (A) (by C-G formula based on SCr of 1.28 mg/dL (H)).   Medical History: Past Medical History:  Diagnosis Date   Bilateral lower extremity edema: chronic with venous stasis changes 06/02/2014   Bipolar 1 disorder (HCC)    Bipolar disorder (HCC)    CHF (congestive heart failure) (HCC)    Chronic kidney disease    Gout    Gout    Hx of blood clots    Hypertension    Mood swings    OSA on CPAP 06/02/2014   Other and unspecified hyperlipidemia 06/02/2014   Type II or unspecified type diabetes mellitus with unspecified complication, uncontrolled 06/02/2014    Medications:  See MAR  Assessment: Patient was admitted 03/30/2022 for weakness and sacral decubitus ulcers. PTA rivaroxaban for atrial fibrillation was continued inpatient. It appears that he will need surgery for a sacral abscess, so Dr. Candiss Norse wishes to switch from rivaroxaban to heparin leading up to surgery. This surgery will likely be scheduled for early next  week.   Last rivaroxaban dose was 5/19 @16 :54. Will start heparin drip @ 15 units/kg/hr, no bolus, on 5/20 @17 :00. CBC stable with Hg 11.8, platelets 245. No signs of bleeding noted. Baseline aPTT & HL ordered.   Goal of Therapy:  Heparin level 0.3-0.7 units/ml aPTT 66-102 seconds Monitor platelets by anticoagulation protocol: Yes   Plan:  - Start heparin infusion at 1500 units/hr at 17:00 on 5/20 - Check anti-Xa level & aPTT at baseline, 6 hours after initiation, & daily; discontinue aPTT monitoring once levels correlate with anti-Xa - Continue to monitor H&H and platelets - Follow surgery plans  Donald Pore, PharmD Pharmacy Resident 04/17/2022, 1:21 PM

## 2022-04-17 NOTE — Progress Notes (Signed)
PROGRESS NOTE        PATIENT DETAILS Name: Christian [name] Age: 71 y.o. Sex: male Date of Birth: 1950/12/01 Admit Date: 03/30/2022 Admitting Physician Eduard Clos, MD PCP:Sun, Charise Carwin, MD  Brief Summary: Patient is a 71 y.o.  male with a history of A-fib on Xarelto, HFpEF, PAD, DM-2, bipolar disorder-presented with generalized weakness/sacral decubitus ulcer-was found to have hypoglycemia, hyperkalemia and subsequently admitted to the hospitalist service-see below for further details.  Thought to have extrapyramidal symptoms-evaluated by psych/neurology-psych medications adjusted with clinical improvement.  Unfortunately-further hospital course complicated by development of fever-due to probable Streptococcus bacteremia and infected decubitus ulcer.  See below for further details.  Significant events: 5/2>> admit to [facility] for weakness/hypoglycemia/hyperkalemia/rigidity-thought to have EPS 5/14>> febrile/tachycardic-Per nursing staff-discharge from prior existing sacral decubitus ulcer.  Started on broad-spectrum IV antibiotics.  Prelim blood culture positive for Streptococcus species. 5/19>> wound care starting hydrotherapy   Significant studies: 5/3>> x-ray pelvis: No fracture. 5/3>> CT head: No acute intracranial findings. 5/3>> x-ray left shoulder: Rotator cuff impingement 5/14>> CXR:No Obvious PNA 5/15>> left upper extremity Doppler: No DVT. 5/16>> CT abdomen/pelvis: Inferior sacral decubitus ulceration-no evidence of osteomyelitis. 5/16>> Echo: EF 45-50%, no obvious vegetations.  Significant microbiology data: 5/14>> blood culture: Streptococcus constellatus  5/15>> blood culture: No growth  Procedures:   Consults: Neurology, psychiatry, infectious disease  Subjective: Patient in bed, appears comfortable, denies any headache, no fever, no chest pain or pressure, no shortness of breath , no abdominal pain. No focal  weakness.  Objective: Vitals: Blood pressure 120/71, pulse 98, temperature 99.3 F (37.4 C), temperature source Oral, resp. rate 20, weight 109.6 kg, SpO2 97 %.   Exam:  Awake Alert, No new F.N deficits but chronically bedbound, contracture boots in both legs, severe chronic generalized weakness [place].AT,PERRAL Supple Neck, No JVD,   Symmetrical Chest wall movement, Good air movement bilaterally, CTAB RRR,No Gallops, Rubs or new Murmurs,  +ve B.Sounds, Abd Soft, No tenderness,   No Cyanosis, Clubbing or edema    Assessment/Plan:  Rigidity/tremors/mildly elevated CK: Felt to be more of extrapyramidal syndrome rather than NMS at this point.  Managed with supportive care.  After neurology/psych evaluation-has been resumed on Abilify and Cogentin-Wellbutrin & Seroquel have been discontinued.  AKI on CKD stage IIIa: AKI likely hemodynamically mediated-improved-creatinine close to baseline.   Mild rhabdomyolysis: Nontraumatic-improved with supportive care.  Sepsis due to possible strep bacteremia-sacral decubitus ulcer with infection: Sepsis physiology improving-continues to have low-grade fever-once afebrile switch from Dapto to Zyvox (end date 5/31) and continue Augmentin until 5/24.  CT abdomen on 5/16 without any sacral osteomyelitis-TTE without vegetation.  Reevaluated by wound care on 5/19-hydrotherapy to be started.   Per ID  Once fever resolve will switch dapto to linezolid Finish dapto-->linezolid on 5/31 Finish amox-clav 5/24    Acute metabolic encephalopathy: Due to sepsis--has significant amount of debility/deconditioning at baseline.    Hyperkalemia: Lokelma being continued at twice daily dosing.  When changed to once daily dosing-recurrent hyperkalemia.   Hypoglycemia: Resolved with supportive care.  Do not resume Amaryl on discharge.  Hypothyroidism: Continue Cytomel.  PAF: Rate controlled-continue Xarelto  PAD: Follow-up with vascular surgery in the outpatient  setting  HFpEF with EF 45 to 50% on recent echocardiogram: Volume status stable-suspect has some amount of edema in lower extremities due to third spacing.  Demadex held due to sepsis  physiology-should be able to resume over the next few days.  Bipolar disorder: Given concerns for EPS-on Abilify and Cogentin-Depakote/Wellbutrin and Seroquel held.  Left shoulder pain: Supportive care-likely rotator cuff injury.  Stable to be followed up by orthopedics in the outpatient setting  Left arm swelling: Likely due to IVF infiltration-could have superficial venous thrombosis-left upper extremity Doppler negative for DVT.  Supportive local care at this point.  OSA: CPAP nightly  DM-2 (A1c 7.6 on 4/15): CBG relatively stable-avoiding tight glycemic control-at risk for hypoglycemia-continue Semglee 10 units and SSI.  Recent Labs    04/16/22 1728 04/16/22 2141 04/17/22 0919  GLUCAP 156* 155* 126*     Debility/deconditioning: Has had worsening of his chronic debility during this hospitalization-plans are of SNF on discharge.  Per family-he has been nonambulatory since his discharge from [facility] in April 2023.  This likely resulted in decubitus ulcer that the patient presented with.  Nutrition Status: Nutrition Problem: Increased nutrient needs Etiology: chronic illness, wound healing Signs/Symptoms: estimated needs Interventions: MVI, Hormel Shake  Pressure Ulcer: Pressure Injury 03/31/22 Sacrum Mid Stage 3 -  Full thickness tissue loss. Subcutaneous fat may be visible but bone, tendon or muscle are NOT exposed. (Active)  03/31/22 1933  Location: Sacrum  Location Orientation: Mid  Staging: Stage 3 -  Full thickness tissue loss. Subcutaneous fat may be visible but bone, tendon or muscle are NOT exposed.  Wound Description (Comments):   Present on Admission: Yes  Dressing Type Foam - Lift dressing to assess site every shift 04/17/22 0351   Obesity: Estimated body mass index is 32.77  kg/m as calculated from the following:   Height as of 03/12/22: 6' (1.829 m).   Weight as of this encounter: 109.6 kg.   Code status:   Code Status: Full Code   DVT Prophylaxis: rivaroxaban (XARELTO) tablet 20 mg    Family Communication: Daughter-[name]-[phone]-updated on 5/18/ 23 by previous MD.   Disposition Plan: Status is: Inpatient Remains inpatient appropriate because: Not stable for discharge-resolving sepsis physiology due to streptococcal bacteremia/infected sacral decubitus ulcer-continues to have low-grade fever-on IV daptomycin/Augmentin.  Hydrotherapy to be started on 5/19.   Planned Discharge Destination:Skilled nursing facility   Diet: Diet Order             DIET - DYS 1 Room service appropriate? Yes with Assist; Fluid consistency: Thin  Diet effective now                     Antimicrobial agents: MEDICATIONS: Scheduled Meds:  amoxicillin-clavulanate  1 tablet Oral Q12H   ARIPiprazole  10 mg Oral q morning   benztropine  1 mg Oral BID   diclofenac Sodium  2 g Topical QID   insulin aspart  0-9 Units Subcutaneous TID WC   insulin glargine-yfgn  10 Units Subcutaneous Daily   leptospermum manuka honey  1 application. Topical Daily   liothyronine  10 mcg Oral Daily   mouth rinse  15 mL Mouth Rinse BID   multivitamin with minerals  1 tablet Oral Daily   rivaroxaban  20 mg Oral Q supper   senna-docusate  2 tablet Oral BID   sodium zirconium cyclosilicate  10 g Oral BID   Continuous Infusions:  sodium chloride 50 mL/hr at 04/17/22 0417   DAPTOmycin (CUBICIN)  IV 700 mg (04/16/22 2009)   PRN Meds:.acetaminophen **OR** acetaminophen, bisacodyl, HYDROcodone-acetaminophen, melatonin   I have personally reviewed following labs and imaging studies  LABORATORY DATA:  Recent Labs  Lab 04/11/22 0104 04/12/22 0144 04/13/22 0730 04/14/22 0117 04/16/22 0234  WBC 10.2 9.8 10.0 10.0 11.4*  HGB 13.2 13.1 11.6* 13.0 11.8*  HCT 41.9 41.4  36.9* 41.7 36.9*  PLT 275 245 239 251 245  MCV 84.6 85.5 85.6 86.0 83.5  MCH 26.7 27.1 26.9 26.8 26.7  MCHC 31.5 31.6 31.4 31.2 32.0  RDW 17.2* 17.1* 17.2* 17.6* 17.7*    Recent Labs  Lab 04/11/22 0422 04/11/22 0438 04/11/22 0651 04/12/22 0144 04/13/22 0730 04/14/22 0148 04/15/22 0157 04/16/22 0234  NA  --   --   --  134* 140 139 141 142  K  --   --   --  4.8 4.2 4.3 5.3* 3.6  CL  --   --   --  104 111 110 114* 113*  CO2  --   --   --  25 24 22  17* 23  GLUCOSE  --   --   --  105* 112* 156* 173* 120*  BUN  --   --   --  41* 36* 34* 37* 37*  CREATININE  --   --   --  1.47* 1.40* 1.21 1.39* 1.28*  CALCIUM  --   --   --  9.0 9.2 9.2 8.8* 9.1  AST  --   --   --  21 19  --   --   --   ALT  --   --   --  14 14  --   --   --   ALKPHOS  --   --   --  54 55  --   --   --   BILITOT  --   --   --  0.8 0.6  --   --   --   ALBUMIN  --   --   --  2.0* 1.8*  --   --   --   MG  --   --   --   --   --  2.1  --   --   PROCALCITON  --  0.14  --   --   --   --   --   --   LATICACIDVEN 1.9  --  1.8  --   --   --   --   --            RADIOLOGY STUDIES/RESULTS: No results found.   LOS: 17 days   Signature  M.D on 04/17/2022 at 11:12 AM   -  To page go to [email or web address]

## 2022-04-17 NOTE — Progress Notes (Signed)
Physical Therapy Wound Treatment Patient Details  Name: Christian [name] MRN: [id number] Date of Birth: 10-24-1951  Today's Date: 04/17/2022 Time: 1829-9371 Time Calculation (min): 68 min  Subjective  Subjective Assessment Subjective: Pt exchanged pleasantries but did not engage in conversation outside of that. Patient and Family Stated Goals: None stated Date of Onset:  (Unknown) Prior Treatments: Dressing changes  Pain Score:  Pt was premedicated. Moaning at times but overall appeared to tolerate treatment well.   Wound Assessment  Pressure Injury 03/31/22 Sacrum Mid Stage 3 -  Full thickness tissue loss. Subcutaneous fat may be visible but bone, tendon or muscle are NOT exposed. (Active)  Wound Image   04/17/22 1437  Dressing Type Foam - Lift dressing to assess site every shift;Gauze (Comment);Barrier Film (skin prep) 04/17/22 1437  Dressing Clean, Dry, Intact 04/17/22 1437  Dressing Change Frequency Twice a day 04/17/22 1437  State of Healing Eschar 04/17/22 1437  Site / Wound Assessment Black;Yellow;Red 04/17/22 1437  % Wound base Red or Granulating 50% 04/17/22 1437  % Wound base Yellow/Fibrinous Exudate 10% 04/17/22 1437  % Wound base Black/Eschar 40% 04/17/22 1437  % Wound base Other/Granulation Tissue (Comment) 0% 04/17/22 1437  Peri-wound Assessment Intact;Pink 04/17/22 1437  Wound Length (cm) 10 cm 04/17/22 1437  Wound Width (cm) 10 cm 04/17/22 1437  Wound Depth (cm) 0.5 cm 04/17/22 1437  Wound Surface Area (cm^2) 100 cm^2 04/17/22 1437  Wound Volume (cm^3) 50 cm^3 04/17/22 1437  Tunneling (cm) After debridement, noted a 9.5 cm tunnel tracking distally from base of wound (~7:00). 04/17/22 1437  Undermining (cm) 0 04/17/22 1437  Margins Unattached edges (unapproximated) 04/17/22 1437  Drainage Amount Copious 04/17/22 1437  Drainage Description Purulent;Odor - foul 04/17/22 1437  Treatment Debridement (Selective);Hydrotherapy (Pulse lavage);Packing (Dry gauze);Other  (Comment) 04/17/22 1437     Hydrotherapy Pulsed lavage therapy - wound location: Sacrum Pulsed Lavage with Suction (psi): 12 psi Pulsed Lavage with Suction - Normal Saline Used: 1000 mL Pulsed Lavage Tip: Tip with splash shield Selective Debridement Selective Debridement - Location: Sacrum Selective Debridement - Tools Used: Forceps, Scalpel, Scissors Selective Debridement - Tissue Removed: Eschar and necrotic tissue    Wound Assessment and Plan  Wound Therapy - Assess/Plan/Recommendations Wound Therapy - Clinical Statement: This patient presents to hydrotherapy with an unstagable pressure injury to the sacrum. After pulsed lavage, debridement initiated and opened up a large abscessed area beneath the surface. A copious amount of purulence was drained from the wound, and an additional amount of copious fluid escaped with pressure on the L buttock. MD notified and surgery was consulted per PT request. This patient will benefit from continued hydrotherapy for selective removal of unviable tissue, to decrease bioburden, and promote wound bed healing. Wound Therapy - Functional Problem List: Global weakness; apparently had not been out of bed since he got back to ALF from SNF ~1 week (PTA). Factors Delaying/Impairing Wound Healing: Diabetes Mellitus, Incontinence, Infection - systemic/local, Immobility, Multiple medical problems Hydrotherapy Plan: Debridement, Dressing change, Patient/family education, Pulsatile lavage with suction Wound Therapy - Frequency: 3X / week Wound Therapy - Follow Up Recommendations: dressing changes by RN  Wound Therapy Goals- Improve the function of patient's integumentary system by progressing the wound(s) through the phases of wound healing (inflammation - proliferation - remodeling) by: Wound Therapy Goals - Improve the function of patient's integumentary system by progressing the wound(s) through the phases of wound healing by: Decrease Necrotic Tissue to:  20% Decrease Necrotic Tissue - Progress: Goal set today Increase  Granulation Tissue to: 80% Increase Granulation Tissue - Progress: Goal set today Improve Drainage Characteristics: Min, Serous Improve Drainage Characteristics - Progress: Goal set today Goals/treatment plan/discharge plan were made with and agreed upon by patient/family: Yes Time For Goal Achievement: 7 days Wound Therapy - Potential for Goals: Fair  Goals will be updated until maximal potential achieved or discharge criteria met.  Discharge criteria: when goals achieved, discharge from hospital, MD decision/surgical intervention, no progress towards goals, refusal/missing three consecutive treatments without notification or medical reason.  GP     Charges PT Wound Care Charges $Wound Debridement up to 20 cm: < or equal to 20 cm $ Wound Debridement each add'l 20 sqcm: 2 $PT PLS Gun and Tip: 1 Supply $PT Hydrotherapy Visit: 1 Visit       Thelma Comp 04/17/2022, 2:53 PM  Rolinda Roan, PT, DPT Acute Rehabilitation Services Secure Chat Preferred Office: [phone]

## 2022-04-17 NOTE — Progress Notes (Signed)
Christian [name] is [name] 71 y.o. male patient. Oral care complete. Q2 hour turns facilitated with 2 person assistance, patient is dependent care. WOCN consult placed and complete this shift to assess wound. New dressing orders provided and dressing changed three times this shift according to dressing order per [name]. Christian [name] odor and purulent drainage noted. [name] is aware. Bladder scans Q4 hours complete. Patient is retaining urine. [name] made aware. 64F Foley catheter placed. Oral hygenic needs assisted with bedside suctioning Q4 hours.    1. Elevated CK   2. Failure to thrive in adult   3. PAD (peripheral artery disease) ([place])   4. Hyperkalemia   5. Hypoglycemia    Past Medical History:  Diagnosis Date   Bilateral lower extremity edema: chronic with venous stasis changes 06/02/2014   Bipolar 1 disorder (HCC)    Bipolar disorder (HCC)    CHF (congestive heart failure) ([place])    Chronic kidney disease    Gout    Gout    Hx of blood clots    Hypertension    Mood swings    OSA on CPAP 06/02/2014   Other and unspecified hyperlipidemia 06/02/2014   Type II or unspecified type diabetes mellitus with unspecified complication, uncontrolled 06/02/2014   Current Facility-Administered Medications  Medication Dose Route Frequency Provider Last Rate Last Admin   0.9 %  sodium chloride infusion   Intravenous Continuous Christian [name] 50 mL/hr at 04/17/22 0417 New Bag at 04/17/22 0417   acetaminophen (TYLENOL) tablet 650 mg  650 mg Oral Q6H PRN Christian [name]   650 mg at 04/14/22 2315   Or   acetaminophen (TYLENOL) suppository 650 mg  650 mg Rectal Q6H PRN Christian [name]   650 mg at 04/11/22 1747   amoxicillin-clavulanate (AUGMENTIN) 875-125 MG per tablet 1 tablet  1 tablet Oral Q12H Christian [name]   1 tablet at 04/17/22 [id number]   ARIPiprazole (ABILIFY) tablet 10 mg  10 mg Oral q morning Christian [name]   10 mg at 04/17/22 [id number]   benztropine (COGENTIN) tablet 1 mg  1 mg Oral BID  Christian [name]   1 mg at 04/17/22 0953   bisacodyl (DULCOLAX) EC tablet 10 mg  10 mg Oral Daily PRN Christian [name]   10 mg at 04/03/22 1627   DAPTOmycin (CUBICIN) 700 mg in sodium chloride 0.9 % IVPB  8 mg/kg (Adjusted) Intravenous Q2000 Christian [name] 128 mL/hr at 04/16/22 2009 700 mg at 04/16/22 2009   diclofenac Sodium (VOLTAREN) 1 % topical gel 2 g  2 g Topical QID Christian [name]   2 g at 04/17/22 0951   HYDROcodone-acetaminophen (NORCO/VICODIN) 5-325 MG per tablet 1 tablet  1 tablet Oral Q4H PRN Christian [name]   1 tablet at 04/14/22 1726   insulin aspart (novoLOG) injection 0-9 Units  0-9 Units Subcutaneous TID WC Christian [name]   1 Units at 04/17/22 0953   insulin glargine-yfgn (SEMGLEE) injection 10 Units  10 Units Subcutaneous Daily Christian [name]   10 Units at 04/17/22 0954   leptospermum manuka honey (MEDIHONEY) paste 1 application.  1 application. Topical Daily Christian [name]   1 application. at 04/17/22 0951   liothyronine (CYTOMEL) tablet 10 mcg  10 mcg Oral Daily Christian [name]   10 mcg at 04/17/22 [id number]   MEDLINE mouth rinse  15 mL Mouth Rinse BID [employer], [employer]  M, [name]   15 mL at 04/17/22 0951   melatonin tablet 10 mg  10 mg Oral QHS PRN Christian [name]   10 mg at 04/13/22 2135   multivitamin with minerals tablet 1 tablet  1 tablet Oral Daily Christian [name]   1 tablet at 04/17/22 [id number]   rivaroxaban (XARELTO) tablet 20 mg  20 mg Oral Q supper Christian [name]   20 mg at 04/16/22 1654   senna-docusate (Senokot-S) tablet 2 tablet  2 tablet Oral BID Christian [name]   2 tablet at 04/17/22 [id number]   sodium zirconium cyclosilicate (LOKELMA) packet 10 g  10 g Oral BID Christian [name]   10 g at 04/17/22 [id number]   Allergies  Allergen Reactions   Amlodipine Swelling and Other (See Comments)    Legs became swollen   Onglyza [Saxagliptin] Other (See Comments)    "Allergic," per [place]   Zyprexa [Olanzapine] Other (See Comments)    Allergy noted by ACT Team. No reaction specified (??)- "Allergic," per [place]   Ritalin [Methylphenidate] Anxiety and Other (See Comments)    "[place]"   Principal Problem:   Hyperkalemia Active Problems:   Bacteremia   Diabetes mellitus type 2 in nonobese ([place])   Sacral decubitus ulcer, stage II ([place])   Severe sepsis without septic shock (HCC)   Hypoglycemia  Blood pressure 120/71, pulse 98, temperature 99.3 F (37.4 C), temperature source Oral, resp. rate 20, weight 109.6 kg, SpO2 97 %.  Subjective Objective: Vital signs: (most recent): Blood pressure 120/71, pulse 98, temperature 99.3 F (37.4 C), temperature source Oral, resp. rate 20, weight 109.6 kg, SpO2 97 %.   Assessment & Plan  Christian [name] 04/17/2022

## 2022-04-17 NOTE — Progress Notes (Signed)
Heparin drip initiated at 44mL/hr at 1749 per MD order. PTT drawn by lab prior. Lab to followup on PTT blood draw according to heparin drip parameters.

## 2022-04-17 NOTE — Consult Note (Signed)
CC: Sacral decub  Requesting Physician: Lala Lund MD  HPI: Christian [name] is an 71 y.o. male hx A-fib on Xarelto, HFpEF, PAD, DM-2, bipolar disorder admitted 5/2 with weakness/hypoglycemia/hyperkalemia/rigidity-thought to have EPS.   04/11/21 - febrile/tachycardic-Per nursing staff-discharge from prior existing sacral decubitus ulcer.  Started on broad-spectrum IV antibiotics.  Prelim blood culture positive for Streptococcus species.  CT A/P 04/13/22 - sacral decub with soft tissue emphysema consistent with soft tissue infection. No evident osteomyelitis.   5/19 - Hydrotherapy began  We were asked to see today after he underwent wound care where a post-sacral abscess has apparently spontaneously drained. He is currently eating and had dose of Xarelto.   He reports no complaints at present. One of his friends left the bedside when we entered whom he was visiting with.  His bedside nurse reports that an apparent pus pocket/abscess spontaneously drained today and he had copious amounts of purulent drainage.  Following this, the wound has cleaned up and there has been no significant drainage.  Past Medical History:  Diagnosis Date   Bilateral lower extremity edema: chronic with venous stasis changes 06/02/2014   Bipolar 1 disorder (HCC)    Bipolar disorder (HCC)    CHF (congestive heart failure) ([place])    Chronic kidney disease    Gout    Gout    Hx of blood clots    Hypertension    Mood swings    OSA on CPAP 06/02/2014   Other and unspecified hyperlipidemia 06/02/2014   Type II or unspecified type diabetes mellitus with unspecified complication, uncontrolled 06/02/2014    Past Surgical History:  Procedure Laterality Date   ABDOMINAL AORTOGRAM W/LOWER EXTREMITY N/A 09/16/2021   Procedure: ABDOMINAL AORTOGRAM W/LOWER EXTREMITY;  Surgeon: Broadus John, MD;  Location: [facility];  Service: Cardiovascular;  Laterality: N/A;   LEFT HEART CATH AND CORONARY ANGIOGRAPHY Left  04/01/2020   Procedure: LEFT HEART CATH AND CORONARY ANGIOGRAPHY;  Surgeon: Dionisio David, MD;  Location: [facility];  Service: Cardiovascular;  Laterality: Left;   PERIPHERAL VASCULAR BALLOON ANGIOPLASTY Left 09/16/2021   Procedure: PERIPHERAL VASCULAR BALLOON ANGIOPLASTY;  Surgeon: Broadus John, MD;  Location: [facility];  Service: Cardiovascular;  Laterality: Left;  attempted to PTA posterior tibial artery; unable to cross lesion    Family History  Problem Relation Age of Onset   Dementia Mother    Arthritis Father    Hypertension Father    Mental illness Sister    Diabetes Sister    Heart failure Neg Hx    Kidney failure Neg Hx    Cancer Neg Hx     Social:  reports that he has never smoked. He has never used smokeless tobacco. He reports that he does not drink alcohol and does not use drugs.  Allergies:  Allergies  Allergen Reactions   Amlodipine Swelling and Other (See Comments)    Legs became swollen   Onglyza [Saxagliptin] Other (See Comments)    "Allergic," per [place] [facility]   Zyprexa [Olanzapine] Other (See Comments)    Allergy noted by ACT Team. No reaction specified (??)- "Allergic," per [place]   Ritalin [Methylphenidate] Anxiety and Other (See Comments)    "[place]"    Medications: I have reviewed the patient's current medications.  Results for orders placed or performed during the hospital encounter of 03/30/22 (from the past 48 hour(s))  Glucose, capillary     Status: Abnormal   Collection Time:  04/15/22  4:15 PM  Result Value Ref Range   Glucose-Capillary 235 (H) 70 - 99 mg/dL    Comment: Glucose reference range applies only to samples taken after fasting for at least 8 hours.  Glucose, capillary     Status: Abnormal   Collection Time: 04/15/22  9:18 PM  Result Value Ref Range   Glucose-Capillary 146 (H) 70 - 99 mg/dL    Comment: Glucose reference range applies only to samples taken after fasting for at least 8 hours.   CBC     Status: Abnormal   Collection Time: 04/16/22  2:34 AM  Result Value Ref Range   WBC 11.4 (H) 4.0 - 10.5 K/uL   RBC 4.42 4.22 - 5.81 MIL/uL   Hemoglobin 11.8 (L) 13.0 - 17.0 g/dL   HCT 36.9 (L) 39.0 - 52.0 %   MCV 83.5 80.0 - 100.0 fL   MCH 26.7 26.0 - 34.0 pg   MCHC 32.0 30.0 - 36.0 g/dL   RDW 17.7 (H) 11.5 - 15.5 %   Platelets 245 150 - 400 K/uL   nRBC 0.0 0.0 - 0.2 %    Comment: Performed at [facility] [address], [place], [place] [id number]  Basic metabolic panel     Status: Abnormal   Collection Time: 04/16/22  2:34 AM  Result Value Ref Range   Sodium 142 135 - 145 mmol/L   Potassium 3.6 3.5 - 5.1 mmol/L   Chloride 113 (H) 98 - 111 mmol/L   CO2 23 22 - 32 mmol/L   Glucose, Bld 120 (H) 70 - 99 mg/dL    Comment: Glucose reference range applies only to samples taken after fasting for at least 8 hours.   BUN 37 (H) 8 - 23 mg/dL   Creatinine, Ser 1.28 (H) 0.61 - 1.24 mg/dL   Calcium 9.1 8.9 - 10.3 mg/dL   GFR, Estimated >60 >60 mL/min    Comment: (NOTE) Calculated using the CKD-EPI Creatinine Equation (2021)    Anion gap 6 5 - 15    Comment: Performed at [facility] [address], [place], [place] [address]  Glucose, capillary     Status: None   Collection Time: 04/16/22  7:23 AM  Result Value Ref Range   Glucose-Capillary 92 70 - 99 mg/dL    Comment: Glucose reference range applies only to samples taken after fasting for at least 8 hours.  Glucose, capillary     Status: Abnormal   Collection Time: 04/16/22 12:17 PM  Result Value Ref Range   Glucose-Capillary 107 (H) 70 - 99 mg/dL    Comment: Glucose reference range applies only to samples taken after fasting for at least 8 hours.  Glucose, capillary     Status: Abnormal   Collection Time: 04/16/22  5:28 PM  Result Value Ref Range   Glucose-Capillary 156 (H) 70 - 99 mg/dL    Comment: Glucose reference range applies only to samples taken after fasting for at least 8 hours.  Glucose,  capillary     Status: Abnormal   Collection Time: 04/16/22  9:41 PM  Result Value Ref Range   Glucose-Capillary 155 (H) 70 - 99 mg/dL    Comment: Glucose reference range applies only to samples taken after fasting for at least 8 hours.  Glucose, capillary     Status: Abnormal   Collection Time: 04/17/22  9:19 AM  Result Value Ref Range   Glucose-Capillary 126 (H) 70 - 99 mg/dL  Comment: Glucose reference range applies only to samples taken after fasting for at least 8 hours.  Procalcitonin - Baseline     Status: None   Collection Time: 04/17/22 11:21 AM  Result Value Ref Range   Procalcitonin 0.27 ng/mL    Comment:        Interpretation: PCT (Procalcitonin) <= 0.5 ng/mL: Systemic infection (sepsis) is not likely. Local bacterial infection is possible. (NOTE)       Sepsis PCT Algorithm           Lower Respiratory Tract                                      Infection PCT Algorithm    ----------------------------     ----------------------------         PCT < 0.25 ng/mL                PCT < 0.10 ng/mL          Strongly encourage             Strongly discourage   discontinuation of antibiotics    initiation of antibiotics    ----------------------------     -----------------------------       PCT 0.25 - 0.50 ng/mL            PCT 0.10 - 0.25 ng/mL               OR       >80% decrease in PCT            Discourage initiation of                                            antibiotics      Encourage discontinuation           of antibiotics    ----------------------------     -----------------------------         PCT >= 0.50 ng/mL              PCT 0.26 - 0.50 ng/mL               AND        <80% decrease in PCT             Encourage initiation of                                             antibiotics       Encourage continuation           of antibiotics    ----------------------------     -----------------------------        PCT >= 0.50 ng/mL                  PCT > 0.50 ng/mL                AND         increase in PCT                  Strongly encourage  initiation of antibiotics    Strongly encourage escalation           of antibiotics                                     -----------------------------                                           PCT <= 0.25 ng/mL                                                 OR                                        > 80% decrease in PCT                                      Discontinue / Do not initiate                                             antibiotics  Performed at [facility], [address] [place], [place] [address]     No results found.  ROS - all of the below systems have been reviewed with the patient and positives are indicated with bold text General: chills, fever or night sweats Eyes: blurry vision or double vision ENT: epistaxis or sore throat Allergy/Immunology: itchy/watery eyes or nasal congestion Hematologic/Lymphatic: bleeding problems, blood clots or swollen lymph nodes Endocrine: temperature intolerance or unexpected weight changes Breast: new or changing breast lumps or nipple discharge Resp: cough, shortness of breath, or wheezing CV: chest pain or dyspnea on exertion GI: as per HPI GU: dysuria, trouble voiding, or hematuria MSK: joint pain or joint stiffness Neuro: TIA or stroke symptoms Derm: pruritus and skin lesion changes Psych: anxiety and depression  PE Blood pressure 114/69, pulse (!) 105, temperature 98.6 F (37 C), temperature source Oral, resp. rate (!) 26, weight 109.6 kg, SpO2 93 %.  Wound assessed with assistance of his nurse Dawn Nogal  Constitutional: NAD; conversant Eyes: Moist conjunctiva; no lid lag; anicteric Lungs: Normal respiratory effort GI: Abd obese, soft, nontender Skin: Sacral decubitus with sloughed eschar; no erythema or fluctuance, tunneling of wound.  Soft tissue sloughing as expected with a chronic sacral decubitus  wound.  No evident undrained abscess at this juncture.  Results for orders placed or performed during the hospital encounter of 03/30/22 (from the past 48 hour(s))  Glucose, capillary     Status: Abnormal   Collection Time: 04/15/22  4:15 PM  Result Value Ref Range   Glucose-Capillary 235 (H) 70 - 99 mg/dL    Comment: Glucose reference range applies only to samples taken after fasting for at least 8 hours.  Glucose, capillary     Status: Abnormal   Collection Time: 04/15/22  9:18 PM  Result Value Ref Range  Glucose-Capillary 146 (H) 70 - 99 mg/dL    Comment: Glucose reference range applies only to samples taken after fasting for at least 8 hours.  CBC     Status: Abnormal   Collection Time: 04/16/22  2:34 AM  Result Value Ref Range   WBC 11.4 (H) 4.0 - 10.5 K/uL   RBC 4.42 4.22 - 5.81 MIL/uL   Hemoglobin 11.8 (L) 13.0 - 17.0 g/dL   HCT 36.9 (L) 39.0 - 52.0 %   MCV 83.5 80.0 - 100.0 fL   MCH 26.7 26.0 - 34.0 pg   MCHC 32.0 30.0 - 36.0 g/dL   RDW 17.7 (H) 11.5 - 15.5 %   Platelets 245 150 - 400 K/uL   nRBC 0.0 0.0 - 0.2 %    Comment: Performed at [facility] [address], [place], [place] [id number]  Basic metabolic panel     Status: Abnormal   Collection Time: 04/16/22  2:34 AM  Result Value Ref Range   Sodium 142 135 - 145 mmol/L   Potassium 3.6 3.5 - 5.1 mmol/L   Chloride 113 (H) 98 - 111 mmol/L   CO2 23 22 - 32 mmol/L   Glucose, Bld 120 (H) 70 - 99 mg/dL    Comment: Glucose reference range applies only to samples taken after fasting for at least 8 hours.   BUN 37 (H) 8 - 23 mg/dL   Creatinine, Ser 1.28 (H) 0.61 - 1.24 mg/dL   Calcium 9.1 8.9 - 10.3 mg/dL   GFR, Estimated >60 >60 mL/min    Comment: (NOTE) Calculated using the CKD-EPI Creatinine Equation (2021)    Anion gap 6 5 - 15    Comment: Performed at [facility] [address], [place], [place] [address]  Glucose, capillary     Status: None   Collection Time: 04/16/22  7:23 AM  Result Value Ref  Range   Glucose-Capillary 92 70 - 99 mg/dL    Comment: Glucose reference range applies only to samples taken after fasting for at least 8 hours.  Glucose, capillary     Status: Abnormal   Collection Time: 04/16/22 12:17 PM  Result Value Ref Range   Glucose-Capillary 107 (H) 70 - 99 mg/dL    Comment: Glucose reference range applies only to samples taken after fasting for at least 8 hours.  Glucose, capillary     Status: Abnormal   Collection Time: 04/16/22  5:28 PM  Result Value Ref Range   Glucose-Capillary 156 (H) 70 - 99 mg/dL    Comment: Glucose reference range applies only to samples taken after fasting for at least 8 hours.  Glucose, capillary     Status: Abnormal   Collection Time: 04/16/22  9:41 PM  Result Value Ref Range   Glucose-Capillary 155 (H) 70 - 99 mg/dL    Comment: Glucose reference range applies only to samples taken after fasting for at least 8 hours.  Glucose, capillary     Status: Abnormal   Collection Time: 04/17/22  9:19 AM  Result Value Ref Range   Glucose-Capillary 126 (H) 70 - 99 mg/dL    Comment: Glucose reference range applies only to samples taken after fasting for at least 8 hours.  Procalcitonin - Baseline     Status: None   Collection Time: 04/17/22 11:21 AM  Result Value Ref Range   Procalcitonin 0.27 ng/mL    Comment:        Interpretation: PCT (Procalcitonin) <= 0.5 ng/mL: Systemic infection (  sepsis) is not likely. Local bacterial infection is possible. (NOTE)       Sepsis PCT Algorithm           Lower Respiratory Tract                                      Infection PCT Algorithm    ----------------------------     ----------------------------         PCT < 0.25 ng/mL                PCT < 0.10 ng/mL          Strongly encourage             Strongly discourage   discontinuation of antibiotics    initiation of antibiotics    ----------------------------     -----------------------------       PCT 0.25 - 0.50 ng/mL            PCT 0.10 - 0.25  ng/mL               OR       >80% decrease in PCT            Discourage initiation of                                            antibiotics      Encourage discontinuation           of antibiotics    ----------------------------     -----------------------------         PCT >= 0.50 ng/mL              PCT 0.26 - 0.50 ng/mL               AND        <80% decrease in PCT             Encourage initiation of                                             antibiotics       Encourage continuation           of antibiotics    ----------------------------     -----------------------------        PCT >= 0.50 ng/mL                  PCT > 0.50 ng/mL               AND         increase in PCT                  Strongly encourage                                      initiation of antibiotics    Strongly encourage escalation           of antibiotics                                     -----------------------------  PCT <= 0.25 ng/mL                                                 OR                                        > 80% decrease in PCT                                      Discontinue / Do not initiate                                             antibiotics  Performed at [facility], [place] [address] [place], [place] [address]     No results found.  I have personally reviewed the relevant CT A/P 04/13/22;   A/P: Christian [name] is an 71 y.o. male with Xarelto, HFpEF, PAD, DM-2, bipolar disorder -with sacral decubitus ulcer that had an associated abscess component to it found on CT scan 5/16.  This has since spontaneously drained today  -Wound with viable skin surrounding it.  Given that an abscess spontaneously just decompressed, would trial irrigation/dressings with quarter percent Dakin's for the next 2 to 3 days while his Xarelto is being held.  Okay with heparin drip as per primary.  Continue with nutrition as per primary.  We will plan to  reassess the wound. Hopefully with good wound care, Dakin's and time this cleans up. She he have significant non-viable tissue precluding wound healing, can plan for surgical debridement. -No urgent/emergent surgery anticipated at present -We will follow with you -IV abx as per primary  I spent a total of 60 minutes in both face-to-face and non-face-to-face activities, excluding procedures performed, for this visit on the date of this encounter.  Nadeen Landau, [facility], [place]

## 2022-04-17 NOTE — Progress Notes (Signed)
[place] for heparin Indication: atrial fibrillation Brief A/P: aPTT subtherapeutic Increase Heparin rate   Allergies  Allergen Reactions   Amlodipine Swelling and Other (See Comments)    Legs became swollen   Onglyza [Saxagliptin] Other (See Comments)    "Allergic," per [place]   Zyprexa [Olanzapine] Other (See Comments)    Allergy noted by ACT Team. No reaction specified (??)- "Allergic," per [place]   Ritalin [Methylphenidate] Anxiety and Other (See Comments)    "[place]"    Patient Measurements: Height: 6' (182.9 cm) Weight: 109.8 kg (242 lb 1 oz) IBW/kg (Calculated) : 77.6 Heparin Dosing Weight: 100.8kg  Vital Signs: Temp: 103 F (39.4 C) (05/20 2005) Temp Source: Rectal (05/20 2005) BP: 120/68 (05/20 2130) Pulse Rate: 88 (05/20 2130)  Labs: Recent Labs    04/15/22 0157 04/16/22 0234 04/17/22 1322 04/17/22 2254  HGB  --  11.8*  --   --   HCT  --  36.9*  --   --   PLT  --  245  --   --   APTT  --   --  33 63*  HEPARINUNFRC  --   --  1.06* 0.65  CREATININE 1.39* 1.28*  --   --   CKTOTAL 63  --   --   --      Estimated Creatinine Clearance: 70.7 mL/min (A) (by C-G formula based on SCr of 1.28 mg/dL (H)).  Assessment: 71 y.o. male with h/o Afib, Xarelto on hold, for heparin  Goal of Therapy:  Heparin level 0.3-0.7 units/ml aPTT 66-102 seconds Monitor platelets by anticoagulation protocol: Yes   Increase Heparin 1650 units/hr  Phillis Knack, PharmD, BCPS  04/17/2022, 11:31 PM

## 2022-04-17 NOTE — Plan of Care (Signed)
  Problem: Education: Goal: Knowledge of General Education information will improve Description: Including pain rating scale, medication(s)/side effects and non-pharmacologic comfort measures Outcome: Progressing   Problem: Clinical Measurements: Goal: Ability to maintain clinical measurements within normal limits will improve Outcome: Progressing Goal: Will remain free from infection Outcome: Progressing Goal: Diagnostic test results will improve Outcome: Progressing Goal: Respiratory complications will improve Outcome: Progressing Goal: Cardiovascular complication will be avoided Outcome: Progressing   Problem: Activity: Goal: Risk for activity intolerance will decrease Outcome: Progressing   Problem: Nutrition: Goal: Adequate nutrition will be maintained Outcome: Progressing   Problem: Pain Managment: Goal: General experience of comfort will improve Outcome: Progressing   Problem: Safety: Goal: Ability to remain free from injury will improve Outcome: Progressing   Problem: Skin Integrity: Goal: Risk for impaired skin integrity will decrease Outcome: Progressing   

## 2022-04-18 ENCOUNTER — Inpatient Hospital Stay (HOSPITAL_COMMUNITY): Payer: Medicare HMO

## 2022-04-18 DIAGNOSIS — Z515 Encounter for palliative care: Secondary | ICD-10-CM

## 2022-04-18 DIAGNOSIS — A419 Sepsis, unspecified organism: Secondary | ICD-10-CM | POA: Diagnosis not present

## 2022-04-18 DIAGNOSIS — E875 Hyperkalemia: Secondary | ICD-10-CM | POA: Diagnosis not present

## 2022-04-18 DIAGNOSIS — I739 Peripheral vascular disease, unspecified: Secondary | ICD-10-CM

## 2022-04-18 DIAGNOSIS — R7881 Bacteremia: Secondary | ICD-10-CM | POA: Diagnosis not present

## 2022-04-18 DIAGNOSIS — R6520 Severe sepsis without septic shock: Secondary | ICD-10-CM | POA: Diagnosis not present

## 2022-04-18 DIAGNOSIS — R627 Adult failure to thrive: Secondary | ICD-10-CM | POA: Diagnosis not present

## 2022-04-18 DIAGNOSIS — Z7189 Other specified counseling: Secondary | ICD-10-CM

## 2022-04-18 LAB — CBC WITH DIFFERENTIAL/PLATELET
Abs Immature Granulocytes: 0.07 10*3/uL (ref 0.00–0.07)
Basophils Absolute: 0 10*3/uL (ref 0.0–0.1)
Basophils Relative: 0 %
Eosinophils Absolute: 0.1 10*3/uL (ref 0.0–0.5)
Eosinophils Relative: 2 %
HCT: 34 % — ABNORMAL LOW (ref 39.0–52.0)
Hemoglobin: 10.5 g/dL — ABNORMAL LOW (ref 13.0–17.0)
Immature Granulocytes: 1 %
Lymphocytes Relative: 16 %
Lymphs Abs: 1.5 10*3/uL (ref 0.7–4.0)
MCH: 26.4 pg (ref 26.0–34.0)
MCHC: 30.9 g/dL (ref 30.0–36.0)
MCV: 85.6 fL (ref 80.0–100.0)
Monocytes Absolute: 0.5 10*3/uL (ref 0.1–1.0)
Monocytes Relative: 5 %
Neutro Abs: 7.2 10*3/uL (ref 1.7–7.7)
Neutrophils Relative %: 76 %
Platelets: 281 10*3/uL (ref 150–400)
RBC: 3.97 MIL/uL — ABNORMAL LOW (ref 4.22–5.81)
RDW: 17.6 % — ABNORMAL HIGH (ref 11.5–15.5)
WBC: 9.5 10*3/uL (ref 4.0–10.5)
nRBC: 0 % (ref 0.0–0.2)

## 2022-04-18 LAB — COMPREHENSIVE METABOLIC PANEL
ALT: 24 U/L (ref 0–44)
AST: 30 U/L (ref 15–41)
Albumin: <1.5 g/dL — ABNORMAL LOW (ref 3.5–5.0)
Alkaline Phosphatase: 55 U/L (ref 38–126)
Anion gap: 4 — ABNORMAL LOW (ref 5–15)
BUN: 27 mg/dL — ABNORMAL HIGH (ref 8–23)
CO2: 22 mmol/L (ref 22–32)
Calcium: 9 mg/dL (ref 8.9–10.3)
Chloride: 118 mmol/L — ABNORMAL HIGH (ref 98–111)
Creatinine, Ser: 1.11 mg/dL (ref 0.61–1.24)
GFR, Estimated: >60 mL/min (ref >60)
Glucose, Bld: 94 mg/dL (ref 70–99)
Potassium: 3 mmol/L — ABNORMAL LOW (ref 3.5–5.1)
Sodium: 144 mmol/L (ref 135–145)
Total Bilirubin: 0.5 mg/dL (ref 0.3–1.2)
Total Protein: 5.8 g/dL — ABNORMAL LOW (ref 6.5–8.1)

## 2022-04-18 LAB — HEPARIN LEVEL (UNFRACTIONATED)
Heparin Unfractionated: 0.71 IU/mL — ABNORMAL HIGH (ref 0.30–0.70)
Heparin Unfractionated: 0.75 IU/mL — ABNORMAL HIGH (ref 0.30–0.70)

## 2022-04-18 LAB — GLUCOSE, CAPILLARY
Glucose-Capillary: 90 mg/dL (ref 70–99)
Glucose-Capillary: 107 mg/dL — ABNORMAL HIGH (ref 70–99)
Glucose-Capillary: 128 mg/dL — ABNORMAL HIGH (ref 70–99)
Glucose-Capillary: 152 mg/dL — ABNORMAL HIGH (ref 70–99)

## 2022-04-18 LAB — APTT
aPTT: 62 seconds — ABNORMAL HIGH (ref 24–36)
aPTT: 51 seconds — ABNORMAL HIGH (ref 24–36)

## 2022-04-18 LAB — MAGNESIUM: Magnesium: 2 mg/dL (ref 1.7–2.4)

## 2022-04-18 LAB — BRAIN NATRIURETIC PEPTIDE: B Natriuretic Peptide: 360.3 pg/mL — ABNORMAL HIGH (ref 0.0–100.0)

## 2022-04-18 LAB — PROCALCITONIN: Procalcitonin: 0.24 ng/mL

## 2022-04-18 LAB — C-REACTIVE PROTEIN: CRP: 17.9 mg/dL — ABNORMAL HIGH (ref <1.0)

## 2022-04-18 LAB — AMMONIA: Ammonia: 23 umol/L (ref 9–35)

## 2022-04-18 MED ORDER — LACTATED RINGERS IV SOLN: INTRAVENOUS | Status: DC

## 2022-04-18 MED ORDER — FUROSEMIDE 10 MG/ML IJ SOLN
20.0000 mg | Freq: Once | INTRAMUSCULAR | Status: AC
  Administered 2022-04-18: 20 mg via INTRAVENOUS
  Filled 2022-04-18: qty 2

## 2022-04-18 MED ORDER — ENOXAPARIN SODIUM 100 MG/ML IJ SOSY
100.0000 mg | PREFILLED_SYRINGE | Freq: Two times a day (BID) | INTRAMUSCULAR | Status: DC
  Administered 2022-04-18 – 2022-04-22 (×8): 100 mg via SUBCUTANEOUS
  Filled 2022-04-18 (×8): qty 1

## 2022-04-18 MED ORDER — POTASSIUM CHLORIDE 2 MEQ/ML IV SOLN
INTRAVENOUS | Status: DC
  Filled 2022-04-18 (×2): qty 1000

## 2022-04-18 MED ORDER — POTASSIUM CHLORIDE 20 MEQ PO PACK: 40.0000 meq | PACK | Freq: Two times a day (BID) | ORAL | Status: DC

## 2022-04-18 NOTE — Consult Note (Signed)
Palliative Care Consult Note                                  Date: 04/18/2022   Patient Name: Christian [name]  DOB: Sep 04, 1951  MRN: [id number]  Age / Sex: 71 y.o., male  PCP: [name], MD Referring Physician: Leroy Sea, MD  Reason for Consultation: Establishing goals of care  HPI/Patient Profile: 71 y.o. male  with past medical history of atrial fibrillation on Xarelto, HFpEF, PAD, DM type II, chronic kidney disease (baseline creatinine 1.6), and bipolar disorder.  He presented to the emergency department on 03/30/2022 with generalized weakness and sacral decubitus ulcer.  He was admitted to the hospitalist service with hypoglycemia and hyperkalemia.  There was initially concern of EPS.  On 5/14, he became febrile and tachycardic as well as developed discharge from his sacral decubitus.  He was started on broad-spectrum IV antibiotics.  Blood cultures came back positive for Streptococcus constellatus. On 5/16, CT abdomen/pelvis showed inferior sacral decubitus ulceration with no evidence of osteomyelitis.  Echo showed EF 45 to 50%, no obvious vegetations.    Past Medical History:  Diagnosis Date   Bilateral lower extremity edema: chronic with venous stasis changes 06/02/2014   Bipolar 1 disorder (HCC)    Bipolar disorder (HCC)    CHF (congestive heart failure) (HCC)    Chronic kidney disease    Gout    Gout    Hx of blood clots    Hypertension    Mood swings    OSA on CPAP 06/02/2014   Other and unspecified hyperlipidemia 06/02/2014   Type II or unspecified type diabetes mellitus with unspecified complication, uncontrolled 06/02/2014    Subjective:   I have reviewed medical records including EPIC notes, labs and imaging, and examined the patient at bedside.  He is alert.  He reports buttocks pain due to his pressure wound.  He is not able to tell me why he is in the hospital.  However, he does tell me he is hopeful to be able  to walk again.  On my assessment, he does not seem to have capacity for medical decision-making.  I spoke with his daughter [name] and his ex wife Gloriajean Dell by phone to discuss diagnosis, prognosis, GOC, EOL wishes, disposition, and options.  I introduced Palliative Medicine as specialized medical care for people living with serious illness. It focuses on providing relief from the symptoms and stress of a serious illness.   We discussed a brief life review of the patient.  Zubair has lived in [place] his entire life.  He has been disabled since 2011 due to mental illness.  Prior to that, he was a driving Press photographer.  He and Gloriajean Dell were married for 33 years; they divorced in 2018.  [name] is his only child.  Family shares that Rakin enjoys music (Gaffer) and sings.  Throughout his life he has also enjoyed spending time with friends and family, church, and sports.  We discussed patient's medical course over the past several months.  Prior to March, Stancil was living independently in a senior living apartment.  He was hospitalized 01/26/2022 through 01/29/2022 with cellulitis secondary to diabetic ulcer of the ankle.  He was discharged to [facility] for rehab.  He came home on 3/17 and family reports he was back to his baseline and ambulatory at this point.  On 03/11/2022, he went to  the vascular surgery office for a follow-up appointment and was subsequently sent to the ER for lethargy.  He was found to be severely hypoglycemic with a CBG of 39.  He was also found to have AKI with creatinine of 2.2.  He was hospitalized at [facility] long 03/11/2022 through 03/19/2022.  He was discharged to [facility], but his insurance only allowed for a certain amount of rehab days within 90 days and he had already used most of them.  Family could not afford to pay out-of-pocket to keep him in rehab, so they brought him home.  They report feeling overwhelmed trying to take care of him and were  having to call EMS every day to help lift him.  We discussed his current illness and what it means in the larger context of his ongoing co-morbidities. Discussed his hospital course has been complicated by the decubitus ulcer with infection and sepsis due to strep bacteremia.  Discussed that he is receiving aggressive treatment for the wound/infection,  including antibiotics and hydrotherapy. Discussed that Keshon also has multiple chronic conditions including diabetes, peripheral artery disease, atrial fibrillation, heart failure, chronic kidney disease.  He has also struggled with a very long history of mental illness.  Provided education on the natural trajectory of chronic illness, emphasizing that it is non-curable and progressive. Discussed that chronic illness results in decreased functional status over time, as patients do not usually return to previous baseline after a major illness.   We discussed that Ilario is very debilitated at this point.  Family expresses much frustration regarding patient's current mobility status.  They feel that his mobility significantly declined while he was hospitalized at [facility] 4/13-4/21.  They report PT was not working with him regularly.  Per chart review, discussed that PT worked with him twice during this hospitalization.  Family is also disappointed and frustrated that patient did not get the full opportunity to rehab, as he had to come home early (due to insurance).   Family is hopeful that West will improve from this acute illness.  They want him to go to rehab with the goal of improving his functional status.  However, they understand that he will most likely not return to his previous baseline.  For this reason, they are considering long-term care placement and are in discussions with social work about applying for Medicaid.  The difference between aggressive medical intervention and comfort care was considered.  Family is interested in all  full scope medical interventions offered. We did discuss code status. Encouraged family to consider DNR/DNI status understanding evidenced based poor outcomes in similar hospitalized patients, as the cause of the arrest is likely associated with chronic/terminal disease rather than a reversible acute cardio-pulmonary event. I explained that DNR/DNI does not change the medical plan and it only comes into effect after a person has arrested (died) and is a protective measure to keep [place] from harming the patient in their last moments of life.  Now, family wishes to continue full CODE STATUS.  Discussed the importance of continued conversation with family and medical team regarding overall plan of care and treatment options, ensuring decisions are within the context of the patients values and GOCs.   Review of Systems  Musculoskeletal:        Buttocks pain   Objective:   Primary Diagnoses: Present on Admission:  Hyperkalemia  Sacral decubitus ulcer, stage II (HCC)  Hypoglycemia   Physical Exam Constitutional:      General: He is  not in acute distress.    Appearance: He is obese.     Comments: Chronically ill-appearing  Pulmonary:     Effort: Pulmonary effort is normal.  Neurological:     Mental Status: He is alert.     Motor: Weakness present.  Psychiatric:        Mood and Affect: Affect is flat.        Cognition and Memory: Cognition is impaired.    Vital Signs:  BP 119/76 (BP Location: Right Arm)   Pulse 62   Temp 97.8 F (36.6 C) (Oral)   Resp (!) 25   Ht 6' (1.829 m)   Wt 109.8 kg   SpO2 94%   BMI 32.83 kg/m   Palliative Assessment/Data: PPS 30%     Assessment & Plan:   SUMMARY OF RECOMMENDATIONS   Full code full scope Family is hopeful for improvement, but understands he will most likely not return to baseline Short-term goal is for rehab to improve functional status PMT will continue to follow  Primary Decision Maker: Patient's daughter, [name]  (she states that she is his legal guardian; this document is not on file)  Prognosis:  Unable to determine  Discharge Planning:  To Be Determined     Thank you for allowing the Palliative Medicine Team to assist in the care of this patient.   Greater than 50%  of this time was spent counseling and coordinating care related to the above assessment and plan.  Total time: 90 minutes   Merry Proud, NP Palliative Medicine   Please contact Palliative Medicine Team phone at [phone] for questions and concerns.  For individual provider, see AMION.

## 2022-04-18 NOTE — Progress Notes (Signed)
Patient found to be diaphoretic on his face and felt hot to the touch.  Temperature checked and oral revealed a temperature of 102.6 and rectal temp of 103. No distress noted at this time. Dr. Delma Post notified of change in status.  See below for further details and patient outcome.  Dr C. Hall updated on patient condition at this time.  04/17/22 2000  Assess: MEWS Score  Temp (!) 102.6 F (39.2 C)  BP 123/71  Pulse Rate (!) 105  ECG Heart Rate (!) 107  Resp (!) 22  Level of Consciousness Alert  SpO2 92 %  Assess: MEWS Score  MEWS Temp 2  MEWS Systolic 0  MEWS Pulse 1  MEWS RR 1  MEWS LOC 0  MEWS Score 4  MEWS Score Color Red  Assess: if the MEWS score is Yellow or Red  Were vital signs taken at a resting state? Yes  Focused Assessment No change from prior assessment  Early Detection of Sepsis Score *See Row Information* Medium  MEWS guidelines implemented *See Row Information* Yes  Treat  Pain Score 0  Take Vital Signs  Increase Vital Sign Frequency  Red: Q 1hr X 4 then Q 4hr X 4, if remains red, continue Q 4hrs  Escalate  MEWS: Escalate Red: discuss with charge nurse/RN and provider, consider discussing with RRT  Notify: Charge Nurse/RN  Name of Charge Nurse/RN Notified Nikki  Date Charge Nurse/RN Notified 04/17/22  Time Charge Nurse/RN Notified 2000  Notify: Provider  Provider Name/Title Dr. Delma Post  Date Provider Notified 04/17/22  Time Provider Notified 2000  Method of Notification Page  Notification Reason Change in status  Provider response No new orders  Notify: Rapid Response  Name of Rapid Response RN Notified David RN  Date Rapid Response Notified 04/17/22  Time Rapid Response Notified 2000     04/18/22 0000  Assess: MEWS Score  Temp (!) 100.6 F (38.1 C)  BP 123/71  Pulse Rate 84  ECG Heart Rate 84  Resp (!) 23  SpO2 95 %  Assess: MEWS Score  MEWS Temp 1  MEWS Systolic 0  MEWS Pulse 0  MEWS RR 1  MEWS LOC 0  MEWS Score 2  MEWS Score Color  Yellow  Assess: if the MEWS score is Yellow or Red  Were vital signs taken at a resting state? Yes  Focused Assessment No change from prior assessment  Early Detection of Sepsis Score *See Row Information* Medium  MEWS guidelines implemented *See Row Information* No, previously red, continue vital signs every 4 hours  Document  Patient Outcome Stabilized after interventions

## 2022-04-18 NOTE — Progress Notes (Addendum)
[facility]  Date of Admission:  03/30/2022     Abx: 5/17-c daptomycin 5/17-c amox-clav  5/15-17 ceftriaxone 5/14-17 metronidazole 5/14 vanc, cefepime                                                         Assessment: 71 y.o. male snf resident, pad, bipolar, HFpEF, ckd, dm2, osa, afib admitted from snf for failure to thrive, with sacral decub ulcer present on admission, course complicated by [facility] with strep constellatus in setting of increased disharge from sacral ulcer   Hob strep constellatus Sacral decub -question of increasing sacral decub discharge -only has peripheral iv's and on admission LUE swelling but no sign of insertion site cellulitis -strep constellatus known to cause metastatic abscess and also high burden concerning for seeding heart valve -source GI vs decub vs skin   Will need ct abd/pelv to evaluate sacral/pelvic abscess abd abscess Will get limited tte to evaluate for valve involvement 5/15 repeat bcx in progress   Pending further study unclear duration of tx at this time  --------- 5/21 assessment Tte no obvious vegetation -- low handoc score feels not needing tee at this time as low suspicion IE 5/15 repeat bcx ngtd Fever again but clinically no difference; eating/appetite at baseline No obvious sign of drug fever  Psych had evaluated and no concern for serotonin/nms syndrome   Given that this is strep milleri which is very pyogenic I would like to get chest ct and head ct repeat to make sure no new abscess     Plan: Continue dapto and po amox-clav Ct head with contrast Ct chest If still no source possibly consider tee Discussed with primary team   I spent more than 35 minute reviewing data/chart, and coordinating care and >50% direct face to face time providing counseling/discussing diagnostics/treatment plan with patient     Principal Problem:   Hyperkalemia Active Problems:   Bacteremia    Diabetes mellitus type 2 in nonobese (HCC)   Sacral decubitus ulcer, stage II (HCC)   Severe sepsis without septic shock (HCC)   Hypoglycemia   Allergies  Allergen Reactions   Amlodipine Swelling and Other (See Comments)    Legs became swollen   Onglyza [Saxagliptin] Other (See Comments)    "Allergic," per [facility]   Zyprexa [Olanzapine] Other (See Comments)    Allergy noted by ACT Team. No reaction specified (??)- "Allergic," per [facility]   Ritalin [Methylphenidate] Anxiety and Other (See Comments)    "Maniac"    Scheduled Meds:  amoxicillin-clavulanate  1 tablet Oral Q12H   ARIPiprazole  10 mg Oral q morning   benztropine  1 mg Oral BID   Chlorhexidine Gluconate Cloth  6 each Topical Daily   diclofenac Sodium  2 g Topical QID   enoxaparin (LOVENOX) injection  100 mg Subcutaneous Q12H   insulin aspart  0-9 Units Subcutaneous TID WC   insulin glargine-yfgn  10 Units Subcutaneous Daily   leptospermum manuka honey  1 application. Topical Daily   liothyronine  10 mcg Oral Daily   mouth rinse  15 mL Mouth Rinse BID   multivitamin with minerals  1 tablet Oral Daily   senna-docusate  2 tablet Oral BID   sodium hypochlorite  Irrigation BID   Continuous Infusions:  DAPTOmycin (CUBICIN)  IV Stopped (04/17/22 2116)   lactated ringers with kcl 75 mL/hr at 04/18/22 1600   [START ON 04/19/2022] lactated ringers     PRN Meds:.acetaminophen **OR** acetaminophen, bisacodyl, HYDROcodone-acetaminophen, melatonin   SUBJECTIVE: No complaint Fever still No diarrhea No rash No headache   Review of Systems: ROS All other ROS was negative, except mentioned above     OBJECTIVE: Vitals:   04/18/22 0000 04/18/22 0400 04/18/22 0741 04/18/22 1400  BP: 123/71 128/79 119/76   Pulse: 84 87 62   Resp: (!) 23 19 (!) 25   Temp: (!) 100.6 F (38.1 C) 98 F (36.7 C) 98.2 F (36.8 C) 97.8 F (36.6 C)  TempSrc: Rectal Oral Oral Oral  SpO2: 95% 95% 94%   Weight:       Height:       Body mass index is 32.83 kg/m.  Physical Exam  General: no distress; weakvoice stable; conversant Heent: per; conj clear; normocephalic Cv: rrr no mrg Lungs: normal respiratory effort Abd soft Ext trace edema Skin no rash Neuro: generalized weakness 3-4/5 strength upper >> Lower ext strength    Lab Results Lab Results  Component Value Date   WBC 9.5 04/18/2022   HGB 10.5 (L) 04/18/2022   HCT 34.0 (L) 04/18/2022   MCV 85.6 04/18/2022   PLT 281 04/18/2022    Lab Results  Component Value Date   CREATININE 1.11 04/18/2022   BUN 27 (H) 04/18/2022   NA 144 04/18/2022   K 3.0 (L) 04/18/2022   CL 118 (H) 04/18/2022   CO2 22 04/18/2022    Lab Results  Component Value Date   ALT 24 04/18/2022   AST 30 04/18/2022   ALKPHOS 55 04/18/2022   BILITOT 0.5 04/18/2022      Microbiology: Recent Results (from the past 240 hour(s))  Culture, blood (Routine X 2) w Reflex to ID Panel     Status: Abnormal   Collection Time: 04/11/22  4:22 AM   Specimen: BLOOD RIGHT HAND  Result Value Ref Range Status   Specimen Description BLOOD RIGHT HAND  Final   Special Requests   Final    BOTTLES DRAWN AEROBIC ONLY Blood Culture adequate volume   Culture  Setup Time   Final    GRAM POSITIVE COCCI IN CHAINS AEROBIC BOTTLE ONLY CRITICAL RESULT CALLED TO, READ BACK BY AND VERIFIED WITH: T RUDISILL,PHARMD@[id number]  04/12/22 MK Performed at [facility], [address] [place], [place] [address]    Culture STREPTOCOCCUS CONSTELLATUS (A)  Final   Report Status 04/14/2022 FINAL  Final   Organism ID, Bacteria STREPTOCOCCUS CONSTELLATUS  Final      Susceptibility   Streptococcus constellatus - MIC*    PENICILLIN INTERMEDIATE Intermediate     CEFTRIAXONE 2 INTERMEDIATE Intermediate     ERYTHROMYCIN <=0.12 SENSITIVE Sensitive     LEVOFLOXACIN 0.5 SENSITIVE Sensitive     VANCOMYCIN 0.5 SENSITIVE Sensitive     * STREPTOCOCCUS CONSTELLATUS  Blood Culture ID Panel (Reflexed)      Status: Abnormal   Collection Time: 04/11/22  4:22 AM  Result Value Ref Range Status   Enterococcus faecalis NOT DETECTED NOT DETECTED Final   Enterococcus Faecium NOT DETECTED NOT DETECTED Final   Listeria monocytogenes NOT DETECTED NOT DETECTED Final   Staphylococcus species NOT DETECTED NOT DETECTED Final   Staphylococcus aureus (BCID) NOT DETECTED NOT DETECTED Final   Staphylococcus epidermidis NOT DETECTED NOT DETECTED Final   Staphylococcus  lugdunensis NOT DETECTED NOT DETECTED Final   Streptococcus species DETECTED (A) NOT DETECTED Final    Comment: Not Enterococcus species, Streptococcus agalactiae, Streptococcus pyogenes, or Streptococcus pneumoniae. CRITICAL RESULT CALLED TO, READ BACK BY AND VERIFIED WITH: T RUDISILL,PHARMD@[id number]  04/12/22 MK    Streptococcus agalactiae NOT DETECTED NOT DETECTED Final   Streptococcus pneumoniae NOT DETECTED NOT DETECTED Final   Streptococcus pyogenes NOT DETECTED NOT DETECTED Final   A.calcoaceticus-baumannii NOT DETECTED NOT DETECTED Final   Bacteroides fragilis NOT DETECTED NOT DETECTED Final   Enterobacterales NOT DETECTED NOT DETECTED Final   Enterobacter cloacae complex NOT DETECTED NOT DETECTED Final   Escherichia coli NOT DETECTED NOT DETECTED Final   Klebsiella aerogenes NOT DETECTED NOT DETECTED Final   Klebsiella oxytoca NOT DETECTED NOT DETECTED Final   Klebsiella pneumoniae NOT DETECTED NOT DETECTED Final   Proteus species NOT DETECTED NOT DETECTED Final   Salmonella species NOT DETECTED NOT DETECTED Final   Serratia marcescens NOT DETECTED NOT DETECTED Final   Haemophilus influenzae NOT DETECTED NOT DETECTED Final   Neisseria meningitidis NOT DETECTED NOT DETECTED Final   Pseudomonas aeruginosa NOT DETECTED NOT DETECTED Final   Stenotrophomonas maltophilia NOT DETECTED NOT DETECTED Final   Candida albicans NOT DETECTED NOT DETECTED Final   Candida auris NOT DETECTED NOT DETECTED Final   Candida glabrata NOT DETECTED NOT  DETECTED Final   Candida krusei NOT DETECTED NOT DETECTED Final   Candida parapsilosis NOT DETECTED NOT DETECTED Final   Candida tropicalis NOT DETECTED NOT DETECTED Final   Cryptococcus neoformans/gattii NOT DETECTED NOT DETECTED Final    Comment: Performed at [facility], [address], [place], [place] [address]  Culture, blood (Routine X 2) w Reflex to ID Panel     Status: Abnormal   Collection Time: 04/11/22  4:38 AM   Specimen: BLOOD  Result Value Ref Range Status   Specimen Description BLOOD RIGHT ANTECUBITAL  Final   Special Requests   Final    BOTTLES DRAWN AEROBIC ONLY Blood Culture adequate volume   Culture  Setup Time   Final    GRAM POSITIVE COCCI IN CHAINS AEROBIC BOTTLE ONLY CRITICAL VALUE NOTED.  VALUE IS CONSISTENT WITH PREVIOUSLY REPORTED AND CALLED VALUE.    Culture (A)  Final    STREPTOCOCCUS CONSTELLATUS SUSCEPTIBILITIES PERFORMED ON PREVIOUS CULTURE WITHIN THE LAST 5 DAYS. Performed at [facility], [address], [place], [place] [address]    Report Status 04/14/2022 FINAL  Final  Culture, blood (Routine X 2) w Reflex to ID Panel     Status: None   Collection Time: 04/12/22 12:20 PM   Specimen: BLOOD  Result Value Ref Range Status   Specimen Description BLOOD RIGHT ANTECUBITAL  Final   Special Requests   Final    BOTTLES DRAWN AEROBIC AND ANAEROBIC Blood Culture adequate volume   Culture   Final    NO GROWTH 5 DAYS Performed at [facility], [address], [place], [place] [address]    Report Status 04/17/2022 FINAL  Final  Culture, blood (Routine X 2) w Reflex to ID Panel     Status: None   Collection Time: 04/12/22 12:22 PM   Specimen: BLOOD LEFT HAND  Result Value Ref Range Status   Specimen Description BLOOD LEFT HAND  Final   Special Requests   Final    BOTTLES DRAWN AEROBIC ONLY Blood Culture results may not be optimal due to an inadequate volume of blood received in culture bottles  Culture   Final    NO GROWTH 5  DAYS Performed at [facility], [address], [place], [place] [address]    Report Status 04/17/2022 FINAL  Final  MRSA Next Gen by PCR, Nasal     Status: None   Collection Time: 04/17/22 11:12 AM   Specimen: Nasal Mucosa; Nasal Swab  Result Value Ref Range Status   MRSA by PCR Next Gen NOT DETECTED NOT DETECTED Final    Comment: (NOTE) The GeneXpert MRSA Assay (FDA approved for NASAL specimens only), is one component of a comprehensive MRSA colonization surveillance program. It is not intended to diagnose MRSA infection nor to guide or monitor treatment for MRSA infections. Test performance is not FDA approved in patients less than 55 years old. Performed at [facility], [address] [place], [place] [address]      Serology:   Imaging: If present, new imagings (plain films, ct scans, and mri) have been personally visualized and interpreted; radiology reports have been reviewed. Decision making incorporated into the Impression / Recommendations.   5/16 abd pelv ct with contrast 1. Inferior sacral decubitus ulceration with underlying ill-defined subcutaneous fluid and soft tissue emphysema consistent with soft tissue infection. No evidence of rectal fistula or sacrococcygeal osteomyelitis. 2. No intra-abdominal fluid collections, ascites or free air. The appendix appears normal. 3. Prominent stool throughout the colon. 4. 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 5. Coronary and Aortic Atherosclerosis   5/16 tte Conclusion(s)/Recommendation(s): No evidence of valvular vegetations on  this transthoracic echocardiogram. Consider a transesophageal  echocardiogram to exclude infective endocarditis if clinically indicated.   Raymondo Band, MD [facility] [phone] pager    04/18/2022, 6:21 PM

## 2022-04-18 NOTE — Progress Notes (Signed)
PROGRESS NOTE        PATIENT DETAILS Name: Christian [name] Age: 71 y.o. Sex: male Date of Birth: [birth year] Admit Date: 03/30/2022 Admitting Physician Rise Patience, MD PCP:Sun, Gari Crown, MD  Brief Summary: Patient is a 71 y.o.  male with a history of A-fib on Xarelto, HFpEF, PAD, DM-2, bipolar disorder-presented with generalized weakness/sacral decubitus ulcer-was found to have hypoglycemia, hyperkalemia and subsequently admitted to the hospitalist service-see below for further details.  Thought to have extrapyramidal symptoms-evaluated by psych/neurology-psych medications adjusted with clinical improvement.  Unfortunately-further hospital course complicated by development of fever-due to probable Streptococcus bacteremia and infected decubitus ulcer.  See below for further details.  Significant events: 5/2>> admit to [facility] for weakness/hypoglycemia/hyperkalemia/rigidity-thought to have EPS 5/14>> febrile/tachycardic-Per nursing staff-discharge from prior existing sacral decubitus ulcer.  Started on broad-spectrum IV antibiotics.  Prelim blood culture positive for Streptococcus species. 5/19>> wound care starting hydrotherapy   Significant studies: 5/3>> x-ray pelvis: No fracture. 5/3>> CT head: No acute intracranial findings. 5/3>> x-ray left shoulder: Rotator cuff impingement 5/14>> CXR:No Obvious PNA 5/15>> left upper extremity Doppler: No DVT. 5/16>> CT abdomen/pelvis: Inferior sacral decubitus ulceration-no evidence of osteomyelitis. 5/16>> Echo: EF 45-50%, no obvious vegetations.  Significant microbiology data: 5/14>> blood culture: Streptococcus constellatus  5/15>> blood culture: No growth  Procedures:   Consults:  Neurology, psychiatry, infectious disease, CCS, Pall care  Subjective: Patient in bed, feels overall weak but denies any headache chest or abdominal pain, says he is comfortable.  Objective: Vitals: Blood pressure 119/76, pulse  62, temperature 98.2 F (36.8 C), temperature source Oral, resp. rate (!) 25, height 6' (1.829 m), weight 109.8 kg, SpO2 94 %.   Exam:  Awake Alert, No new F.N deficits but chronically bedbound, contracture boots in both legs, severe chronic generalized weakness [place].AT,PERRAL Supple Neck, No JVD,   Symmetrical Chest wall movement, Good air movement bilaterally, CTAB RRR,No Gallops, Rubs or new Murmurs,  +ve B.Sounds, Abd Soft, No tenderness,   No Cyanosis, Clubbing or edema     Assessment/Plan:  Rigidity/tremors/mildly elevated CK: Felt to be more of extrapyramidal syndrome rather than NMS at this point.  Managed with supportive care.  After neurology/psych evaluation-has been resumed on Abilify and Cogentin-Wellbutrin & Seroquel have been discontinued.  Sepsis due to possible strep bacteremia-sacral decubitus ulcer with infection and underlying sacral abscess: Sepsis physiology improving-continues to have low-grade fever-once afebrile switch from Dapto to Zyvox (end date 5/31) and continue Augmentin until 5/24.  CT abdomen on 5/16 without any sacral osteomyelitis-TTE without vegetation.  Wound care following, had evidence of self opening of her sacral abscess with drainage on 04/17/2022 during wound care session, general surgery consulted and they are following.  Per ID  Once fever resolve will switch dapto to linezolid Finish dapto-->linezolid on 5/31 Finish amox-clav [id number]    Acute metabolic encephalopathy: Due to sepsis--has significant amount of debility/deconditioning at baseline.    AKI on CKD stage IIIa: AKI likely hemodynamically mediated-improved-creatinine close to baseline.   Mild rhabdomyolysis: Nontraumatic-improved with supportive care.  Hypokalemia: Replaced  Hypoglycemia: Resolved with supportive care.  Do not resume Amaryl on discharge.  Hypothyroidism: Continue Cytomel.  PAF: Rate controlled-on Xarelto, currently switched to heparin drip due to sacral  wound/abscess draining and may require surgical intervention later.  PAD: Follow-up with vascular surgery in the outpatient setting  HFpEF with EF 45 to 50%  on recent echocardiogram: Volume status stable-suspect has some amount of edema in lower extremities due to third spacing.  Demadex held due to sepsis physiology-should be able to resume over the next few days.  Bipolar disorder: Given concerns for EPS-on Abilify and Cogentin-Depakote/Wellbutrin and Seroquel held.  Left shoulder pain: Supportive care-likely rotator cuff injury.  Stable to be followed up by orthopedics in the outpatient setting  Left arm swelling: Likely due to IVF infiltration-could have superficial venous thrombosis-left upper extremity Doppler negative for DVT.  Supportive local care at this point.  OSA: CPAP nightly  DM-2 (A1c 7.6 on 4/15): CBG relatively stable-avoiding tight glycemic control-at risk for hypoglycemia-continue Semglee 10 units and SSI.  Recent Labs    04/17/22 1644 04/17/22 1940 04/18/22 0744  GLUCAP 164* 142* 90     Debility/deconditioning: Has had worsening of his chronic debility during this hospitalization-plans are of SNF on discharge.  Per family-he has been nonambulatory since his discharge from [facility] in April 2023.  This likely resulted in decubitus ulcer that the patient presented with.  Nutrition Status: Nutrition Problem: Increased nutrient needs Etiology: chronic illness, wound healing Signs/Symptoms: estimated needs Interventions: MVI, Hormel Shake  Pressure Ulcer: Pressure Injury 03/31/22 Sacrum Mid Stage 3 -  Full thickness tissue loss. Subcutaneous fat may be visible but bone, tendon or muscle are NOT exposed. (Active)  03/31/22 1933  Location: Sacrum  Location Orientation: Mid  Staging: Stage 3 -  Full thickness tissue loss. Subcutaneous fat may be visible but bone, tendon or muscle are NOT exposed.  Wound Description (Comments):   Present on Admission: Yes   Dressing Type Foam - Lift dressing to assess site every shift;Dakin's-soaked gauze 04/17/22 2245   Obesity: Estimated body mass index is 32.83 kg/m as calculated from the following:   Height as of this encounter: 6' (1.829 m).   Weight as of this encounter: 109.8 kg.   Code status:   Code Status: Full Code   DVT Prophylaxis:    Family Communication: Daughter-[name] [phone] on 5/18/ 23 by previous MD.   Disposition Plan: Status is: Inpatient Remains inpatient appropriate because: Not stable for discharge-resolving sepsis physiology due to streptococcal bacteremia/infected sacral decubitus ulcer-continues to have low-grade fever-on IV daptomycin/Augmentin.  Hydrotherapy to be started on 5/19.   Planned Discharge Destination:Skilled nursing facility   Diet: Diet Order             DIET - DYS 1 Room service appropriate? Yes with Assist; Fluid consistency: Thin  Diet effective now                     Antimicrobial agents: MEDICATIONS: Scheduled Meds:  amoxicillin-clavulanate  1 tablet Oral Q12H   ARIPiprazole  10 mg Oral q morning   benztropine  1 mg Oral BID   Chlorhexidine Gluconate Cloth  6 each Topical Daily   diclofenac Sodium  2 g Topical QID   insulin aspart  0-9 Units Subcutaneous TID WC   insulin glargine-yfgn  10 Units Subcutaneous Daily   leptospermum manuka honey  1 application. Topical Daily   liothyronine  10 mcg Oral Daily   mouth rinse  15 mL Mouth Rinse BID   multivitamin with minerals  1 tablet Oral Daily   senna-docusate  2 tablet Oral BID   sodium hypochlorite   Irrigation BID   Continuous Infusions:  DAPTOmycin (CUBICIN)  IV Stopped (04/17/22 2116)   heparin 1,800 Units/hr (04/18/22 [id number])   lactated ringers with kcl     [  START ON 04/19/2022] lactated ringers     PRN Meds:.acetaminophen **OR** acetaminophen, bisacodyl, HYDROcodone-acetaminophen, melatonin   I have personally reviewed following labs and imaging  studies  LABORATORY DATA:  Recent Labs  Lab 04/12/22 0144 04/13/22 0730 04/14/22 0117 04/16/22 0234 04/18/22 0223  WBC 9.8 10.0 10.0 11.4* 9.5  HGB 13.1 11.6* 13.0 11.8* 10.5*  HCT 41.4 36.9* 41.7 36.9* 34.0*  PLT 245 239 251 245 281  MCV 85.5 85.6 86.0 83.5 85.6  MCH 27.1 26.9 26.8 26.7 26.4  MCHC 31.6 31.4 31.2 32.0 30.9  RDW 17.1* 17.2* 17.6* 17.7* 17.6*  LYMPHSABS  --   --   --   --  1.5  MONOABS  --   --   --   --  0.5  EOSABS  --   --   --   --  0.1  BASOSABS  --   --   --   --  0.0    Recent Labs  Lab 04/12/22 0144 04/13/22 0730 04/14/22 0148 04/15/22 0157 04/16/22 0234 04/17/22 1121 04/18/22 0223  NA 134* 140 139 141 142  --  144  K 4.8 4.2 4.3 5.3* 3.6  --  3.0*  CL 104 111 110 114* 113*  --  118*  CO2 25 24 22  17* 23  --  22  GLUCOSE 105* 112* 156* 173* 120*  --  94  BUN 41* 36* 34* 37* 37*  --  27*  CREATININE 1.47* 1.40* 1.21 1.39* 1.28*  --  1.11  CALCIUM 9.0 9.2 9.2 8.8* 9.1  --  9.0  AST 21 19  --   --   --   --  30  ALT 14 14  --   --   --   --  24  ALKPHOS 54 55  --   --   --   --  55  BILITOT 0.8 0.6  --   --   --   --  0.5  ALBUMIN 2.0* 1.8*  --   --   --   --  <1.5*  MG  --   --  2.1  --   --   --  2.0  CRP  --   --   --   --   --   --  17.9*  PROCALCITON  --   --   --   --   --  0.27 0.24  BNP  --   --   --   --   --   --  360.3*   RADIOLOGY STUDIES/RESULTS: No results found.   LOS: 18 days   Signature  Lala Lund M.D on 04/18/2022 at 8:58 AM   -  To page go to [email or web address]

## 2022-04-18 NOTE — Progress Notes (Signed)
ANTICOAGULATION CONSULT NOTE - Initial Consult  Pharmacy Consult for heparin dosing Indication: atrial fibrillation  Allergies  Allergen Reactions   Amlodipine Swelling and Other (See Comments)    Legs became swollen   Onglyza [Saxagliptin] Other (See Comments)    "Allergic," per [facility]   Zyprexa [Olanzapine] Other (See Comments)    Allergy noted by ACT Team. No reaction specified (??)- "Allergic," per [facility]   Ritalin [Methylphenidate] Anxiety and Other (See Comments)    "[place]"    Patient Measurements: Height: 6' (182.9 cm) Weight: 109.8 kg (242 lb 1 oz) IBW/kg (Calculated) : 77.6 Heparin Dosing Weight: 100.8kg  Vital Signs: Temp: 98 F (36.7 C) (05/21 0400) Temp Source: Oral (05/21 0400) BP: 128/79 (05/21 0400) Pulse Rate: 87 (05/21 0400)  Labs: Recent Labs    04/16/22 0234 04/17/22 1322 04/17/22 2254 04/18/22 0223  HGB 11.8*  --   --  10.5*  HCT 36.9*  --   --  34.0*  PLT 245  --   --  281  APTT  --  33 63* 62*  HEPARINUNFRC  --  1.06* 0.65 0.71*  CREATININE 1.28*  --   --  1.11     Estimated Creatinine Clearance: 81.5 mL/min (by C-G formula based on SCr of 1.11 mg/dL).   Medical History: Past Medical History:  Diagnosis Date   Bilateral lower extremity edema: chronic with venous stasis changes 06/02/2014   Bipolar 1 disorder (HCC)    Bipolar disorder (HCC)    CHF (congestive heart failure) (HCC)    Chronic kidney disease    Gout    Gout    Hx of blood clots    Hypertension    Mood swings    OSA on CPAP 06/02/2014   Other and unspecified hyperlipidemia 06/02/2014   Type II or unspecified type diabetes mellitus with unspecified complication, uncontrolled 06/02/2014    Medications:  See MAR  Assessment: Patient was admitted 03/30/2022 for weakness and sacral decubitus ulcers. PTA rivaroxaban for atrial fibrillation was continued inpatient. Now switching to IV heparin leading up to surgery for a sacral abscess.   aPTT this  morning is subtherapeutic at 62 on 1650 units/hr, drawn ~4 hours after previous dose increase. Heparin level remains elevated at 0.71. Will increase rate again and recheck levels in 6 hours.  Goal of Therapy:  Heparin level 0.3-0.7 units/ml aPTT 66-102 seconds Monitor platelets by anticoagulation protocol: Yes   Plan:  - Increase heparin rate to 1800 units/hr - Check anti-Xa level & aPTT in 6 hours & daily; discontinue aPTT monitoring once levels correlate with anti-Xa - Continue to monitor H&H and platelets - Follow surgery plans  Georgeann Oppenheim, PharmD Pharmacy Resident 04/18/2022, 7:18 AM

## 2022-04-19 ENCOUNTER — Inpatient Hospital Stay (HOSPITAL_COMMUNITY): Payer: Medicare HMO

## 2022-04-19 DIAGNOSIS — E875 Hyperkalemia: Secondary | ICD-10-CM | POA: Diagnosis not present

## 2022-04-19 LAB — CBC WITH DIFFERENTIAL/PLATELET
Abs Immature Granulocytes: 0.02 10*3/uL (ref 0.00–0.07)
Basophils Absolute: 0 10*3/uL (ref 0.0–0.1)
Basophils Relative: 0 %
Eosinophils Absolute: 0.2 10*3/uL (ref 0.0–0.5)
Eosinophils Relative: 2 %
HCT: 33.3 % — ABNORMAL LOW (ref 39.0–52.0)
Hemoglobin: 10.5 g/dL — ABNORMAL LOW (ref 13.0–17.0)
Immature Granulocytes: 0 %
Lymphocytes Relative: 15 %
Lymphs Abs: 1 10*3/uL (ref 0.7–4.0)
MCH: 26.9 pg (ref 26.0–34.0)
MCHC: 31.5 g/dL (ref 30.0–36.0)
MCV: 85.2 fL (ref 80.0–100.0)
Monocytes Absolute: 0.4 10*3/uL (ref 0.1–1.0)
Monocytes Relative: 6 %
Neutro Abs: 5 10*3/uL (ref 1.7–7.7)
Neutrophils Relative %: 77 %
Platelets: 293 10*3/uL (ref 150–400)
RBC: 3.91 MIL/uL — ABNORMAL LOW (ref 4.22–5.81)
RDW: 17.6 % — ABNORMAL HIGH (ref 11.5–15.5)
WBC: 6.5 10*3/uL (ref 4.0–10.5)
nRBC: 0 % (ref 0.0–0.2)

## 2022-04-19 LAB — COMPREHENSIVE METABOLIC PANEL
ALT: 24 U/L (ref 0–44)
AST: 30 U/L (ref 15–41)
Albumin: 1.5 g/dL — ABNORMAL LOW (ref 3.5–5.0)
Alkaline Phosphatase: 58 U/L (ref 38–126)
Anion gap: 5 (ref 5–15)
BUN: 23 mg/dL (ref 8–23)
CO2: 22 mmol/L (ref 22–32)
Calcium: 8.9 mg/dL (ref 8.9–10.3)
Chloride: 116 mmol/L — ABNORMAL HIGH (ref 98–111)
Creatinine, Ser: 1 mg/dL (ref 0.61–1.24)
GFR, Estimated: >60 mL/min (ref >60)
Glucose, Bld: 122 mg/dL — ABNORMAL HIGH (ref 70–99)
Potassium: 3.5 mmol/L (ref 3.5–5.1)
Sodium: 143 mmol/L (ref 135–145)
Total Bilirubin: 0.6 mg/dL (ref 0.3–1.2)
Total Protein: 5.8 g/dL — ABNORMAL LOW (ref 6.5–8.1)

## 2022-04-19 LAB — GLUCOSE, CAPILLARY
Glucose-Capillary: 76 mg/dL (ref 70–99)
Glucose-Capillary: 117 mg/dL — ABNORMAL HIGH (ref 70–99)
Glucose-Capillary: 156 mg/dL — ABNORMAL HIGH (ref 70–99)
Glucose-Capillary: 123 mg/dL — ABNORMAL HIGH (ref 70–99)

## 2022-04-19 LAB — C-REACTIVE PROTEIN: CRP: 13.9 mg/dL — ABNORMAL HIGH (ref <1.0)

## 2022-04-19 LAB — CK: Total CK: 29 U/L — ABNORMAL LOW (ref 49–397)

## 2022-04-19 LAB — PROCALCITONIN: Procalcitonin: 0.11 ng/mL

## 2022-04-19 LAB — MAGNESIUM: Magnesium: 1.8 mg/dL (ref 1.7–2.4)

## 2022-04-19 LAB — BRAIN NATRIURETIC PEPTIDE: B Natriuretic Peptide: 596.1 pg/mL — ABNORMAL HIGH (ref 0.0–100.0)

## 2022-04-19 MED ORDER — IOHEXOL 300 MG/ML  SOLN
100.0000 mL | Freq: Once | INTRAMUSCULAR | Status: AC | PRN
  Administered 2022-04-19: 100 mL via INTRAVENOUS

## 2022-04-19 NOTE — Progress Notes (Signed)
PROGRESS NOTE        PATIENT DETAILS Name: Christian [name] Age: 71 y.o. Sex: male Date of Birth: 07/21/1951 Admit Date: 03/30/2022 Admitting Physician Eduard Clos, MD PCP:Sun, Charise Carwin, MD  Brief Summary: Patient is a 71 y.o.  male with a history of A-fib on Xarelto, HFpEF, PAD, DM-2, bipolar disorder-presented with generalized weakness/sacral decubitus ulcer-was found to have hypoglycemia, hyperkalemia and subsequently admitted to the hospitalist service-see below for further details.  Thought to have extrapyramidal symptoms-evaluated by psych/neurology-psych medications adjusted with clinical improvement.  Unfortunately-further hospital course complicated by development of fever-due to probable Streptococcus bacteremia and infected decubitus ulcer.  See below for further details.  Significant events: 5/2>> admit to [facility] for weakness/hypoglycemia/hyperkalemia/rigidity-thought to have EPS 5/14>> febrile/tachycardic-Per nursing staff-discharge from prior existing sacral decubitus ulcer.  Started on broad-spectrum IV antibiotics.  Prelim blood culture positive for Streptococcus species. 5/19>> wound care starting hydrotherapy   Significant studies: 5/3>> x-ray pelvis: No fracture. 5/3>> CT head: No acute intracranial findings. 5/3>> x-ray left shoulder: Rotator cuff impingement 5/14>> CXR:No Obvious PNA 5/15>> left upper extremity Doppler: No DVT. 5/16>> CT abdomen/pelvis: Inferior sacral decubitus ulceration-no evidence of osteomyelitis. 5/16>> Echo: EF 45-50%, no obvious vegetations.  Significant microbiology data: 5/14>> blood culture: Streptococcus constellatus  5/15>> blood culture: No growth  Procedures:   Consults:  Neurology, psychiatry, infectious disease, CCS, Pall care  Subjective: Patient in bed, appears comfortable, denies any headache, no fever, no chest pain or pressure, no shortness of breath , no abdominal pain. No new focal  weakness.   Objective: Vitals: Blood pressure 131/78, pulse 86, temperature 99.2 F (37.3 C), temperature source Oral, resp. rate 12, height 6' (1.829 m), weight 109.8 kg, SpO2 94 %.   Exam:  Awake Alert, No new F.N deficits but chronically bedbound, contracture boots in both legs, severe chronic generalized weakness [place].AT,PERRAL Supple Neck, No JVD,   Symmetrical Chest wall movement, Good air movement bilaterally, CTAB RRR,No Gallops, Rubs or new Murmurs,  +ve B.Sounds, Abd Soft, No tenderness,   No Cyanosis, Clubbing or edema     Assessment/Plan:  Rigidity/tremors/mildly elevated CK: Felt to be more of extrapyramidal syndrome rather than NMS at this point.  Managed with supportive care.  After neurology/psych evaluation-has been resumed on Abilify and Cogentin-Wellbutrin & Seroquel have been discontinued.  Sepsis due to possible strep bacteremia-sacral decubitus ulcer with infection and underlying sacral abscess: Sepsis physiology improving-continues to have low-grade fever-once afebrile switch from Dapto to Zyvox (end date 5/31) and continue Augmentin until 5/24.  CT abdomen on 5/16 without any sacral osteomyelitis-TTE without vegetation.  Wound care following, had evidence of self opening of her sacral abscess with drainage on 04/17/2022 during wound care session, general surgery consulted and they are following.  Is discussed with ID repeat CT done on 04/19/2022 is nonacute, TEE requested as well as he is still spiking low-grade fevers.  Per ID  Once fever resolve will switch dapto to linezolid Finish dapto-->linezolid on 5/31 Finish amox-clav 5/24    Acute metabolic encephalopathy: Due to sepsis--has significant amount of debility/deconditioning at baseline.    AKI on CKD stage IIIa: AKI likely hemodynamically mediated-improved-creatinine close to baseline.   Mild rhabdomyolysis: Nontraumatic-improved with supportive care.  Hypokalemia: Replaced  Hypoglycemia: Resolved  with supportive care.  Do not resume Amaryl on discharge.  Hypothyroidism: Continue Cytomel.  PAF: Rate controlled-on Xarelto, currently  switched to Lovenox for possible surgery if needed for sacral decubitus abscess.  PAD: Follow-up with vascular surgery in the outpatient setting  HFpEF with EF 45 to 50% on recent echocardiogram: Volume status stable-suspect has some amount of edema in lower extremities due to third spacing.  Demadex held due to sepsis physiology-should be able to resume over the next few days.  Bipolar disorder: Given concerns for EPS-on Abilify and Cogentin-Depakote/Wellbutrin and Seroquel held.  Left shoulder pain: Supportive care-likely rotator cuff injury.  Stable to be followed up by orthopedics in the outpatient setting  Left arm swelling: Likely due to IVF infiltration-could have superficial venous thrombosis-left upper extremity Doppler negative for DVT.  Supportive local care at this point.  Note patient is already on full dose anticoagulation for his A-fib.  OSA: CPAP nightly  DM-2 (A1c 7.6 on 4/15): CBG relatively stable-avoiding tight glycemic control-at risk for hypoglycemia-continue Semglee 10 units and SSI.  Recent Labs    04/18/22 1745 04/18/22 2110 04/19/22 0835  GLUCAP 128* 152* 76     Debility/deconditioning: Has had worsening of his chronic debility during this hospitalization-plans are of SNF on discharge.  Per family-he has been nonambulatory since his discharge from [facility] in April 2023.  This likely resulted in decubitus ulcer that the patient presented with.  Nutrition Status: Nutrition Problem: Increased nutrient needs Etiology: chronic illness, wound healing Signs/Symptoms: estimated needs Interventions: MVI, Hormel Shake  Pressure Ulcer: Pressure Injury 03/31/22 Sacrum Mid Unstageable - Full thickness tissue loss in which the base of the injury is covered by slough (yellow, tan, gray, green or brown) and/or eschar (tan, brown or  black) in the wound bed. (Active)  03/31/22 1933  Location: Sacrum  Location Orientation: Mid  Staging: Unstageable - Full thickness tissue loss in which the base of the injury is covered by slough (yellow, tan, gray, green or brown) and/or eschar (tan, brown or black) in the wound bed.  Wound Description (Comments):   Present on Admission: Yes  Dressing Type Foam - Lift dressing to assess site every shift;Dakin's-soaked gauze 04/18/22 2000   Obesity: Estimated body mass index is 32.83 kg/m as calculated from the following:   Height as of this encounter: 6' (1.829 m).   Weight as of this encounter: 109.8 kg.   Code status:   Code Status: Full Code   DVT Prophylaxis:    Family Communication: Daughter-[name]-updated on 5/18/ 23 by previous MD.   Disposition Plan: Status is: Inpatient Remains inpatient appropriate because: Not stable for discharge-resolving sepsis physiology due to streptococcal bacteremia/infected sacral decubitus ulcer-continues to have low-grade fever-on IV daptomycin/Augmentin.  Hydrotherapy to be started on 5/19.   Planned Discharge Destination:Skilled nursing facility   Diet: Diet Order             DIET - DYS 1 Room service appropriate? Yes with Assist; Fluid consistency: Thin  Diet effective now                   Antimicrobial agents: MEDICATIONS: Scheduled Meds:  amoxicillin-clavulanate  1 tablet Oral Q12H   ARIPiprazole  10 mg Oral q morning   benztropine  1 mg Oral BID   Chlorhexidine Gluconate Cloth  6 each Topical Daily   diclofenac Sodium  2 g Topical QID   enoxaparin (LOVENOX) injection  100 mg Subcutaneous Q12H   insulin aspart  0-9 Units Subcutaneous TID WC   insulin glargine-yfgn  10 Units Subcutaneous Daily   liothyronine  10 mcg Oral Daily  mouth rinse  15 mL Mouth Rinse BID   multivitamin with minerals  1 tablet Oral Daily   senna-docusate  2 tablet Oral BID   sodium hypochlorite   Irrigation BID    Continuous Infusions:  DAPTOmycin (CUBICIN)  IV 700 mg (04/18/22 2000)   lactated ringers 50 mL/hr at 04/19/22 0127   PRN Meds:.acetaminophen **OR** acetaminophen, bisacodyl, HYDROcodone-acetaminophen, melatonin   I have personally reviewed following labs and imaging studies  LABORATORY DATA:  Recent Labs  Lab 04/13/22 0730 04/14/22 0117 04/16/22 0234 04/18/22 0223 04/19/22 0210  WBC 10.0 10.0 11.4* 9.5 6.5  HGB 11.6* 13.0 11.8* 10.5* 10.5*  HCT 36.9* 41.7 36.9* 34.0* 33.3*  PLT 239 251 245 281 293  MCV 85.6 86.0 83.5 85.6 85.2  MCH 26.9 26.8 26.7 26.4 26.9  MCHC 31.4 31.2 32.0 30.9 31.5  RDW 17.2* 17.6* 17.7* 17.6* 17.6*  LYMPHSABS  --   --   --  1.5 1.0  MONOABS  --   --   --  0.5 0.4  EOSABS  --   --   --  0.1 0.2  BASOSABS  --   --   --  0.0 0.0    Recent Labs  Lab 04/13/22 0730 04/14/22 0148 04/15/22 0157 04/16/22 0234 04/17/22 1121 04/18/22 0223 04/18/22 0918 04/19/22 0210  NA 140 139 141 142  --  144  --  143  K 4.2 4.3 5.3* 3.6  --  3.0*  --  3.5  CL 111 110 114* 113*  --  118*  --  116*  CO2 24 22 17* 23  --  22  --  22  GLUCOSE 112* 156* 173* 120*  --  94  --  122*  BUN 36* 34* 37* 37*  --  27*  --  23  CREATININE 1.40* 1.21 1.39* 1.28*  --  1.11  --  1.00  CALCIUM 9.2 9.2 8.8* 9.1  --  9.0  --  8.9  AST 19  --   --   --   --  30  --  30  ALT 14  --   --   --   --  24  --  24  ALKPHOS 55  --   --   --   --  55  --  58  BILITOT 0.6  --   --   --   --  0.5  --  0.6  ALBUMIN 1.8*  --   --   --   --  <1.5*  --  1.5*  MG  --  2.1  --   --   --  2.0  --  1.8  CRP  --   --   --   --   --  17.9*  --  13.9*  PROCALCITON  --   --   --   --  0.27 0.24  --  0.11  AMMONIA  --   --   --   --   --   --  23  --   BNP  --   --   --   --   --  360.3*  --  596.1*   RADIOLOGY STUDIES/RESULTS: CT HEAD W CONTRAST ( )  Result Date: 04/19/2022 CLINICAL DATA:  71 year old male with confusion and bacteremia. EXAM: CT HEAD WITH CONTRAST TECHNIQUE: Contiguous axial  images were obtained from the base of the skull through the vertex with intravenous contrast. RADIATION DOSE REDUCTION: This exam was performed according  to the departmental dose-optimization program which includes automated exposure control, adjustment of the mA and/or kV according to patient size and/or use of iterative reconstruction technique. CONTRAST:  OMNIPAQUE IOHEXOL 300 MG/ML  SOLN COMPARISON:  Noncontrast head CT 03/31/2022.  Brain MRI 10/07/2020. FINDINGS: Brain: Postcontrast imaging only. Cerebral volume is stable. No midline shift, ventriculomegaly, mass effect, evidence of mass lesion, intracranial hemorrhage or evidence of cortically based acute infarction. Compared to 03/31/2022, No abnormal enhancement identified. Evidence of a small area of medial right occipital pole encephalomalacia, stable from recent CT (series 5, image 42 today). Elsewhere gray-white matter differentiation appears to remain normal. Vascular: Extensive Calcified atherosclerosis at the skull base. The major intracranial vascular structures appear to be enhancing, patent. Skull: No acute osseous abnormality identified. Sinuses/Orbits: Visualized paranasal sinuses and mastoids are stable and well aerated. Other: No acute orbit or scalp soft tissue finding. Calcified scalp vessel atherosclerosis redemonstrated. IMPRESSION: 1. No acute intracranial abnormality on postcontrast CT. Small area of right PCA territory encephalomalacia suspected. 2. Advanced calcified atherosclerosis. Electronically Signed   By: Odessa Fleming M.D.   On: 04/19/2022 08:18   CT CHEST W CONTRAST  Result Date: 04/19/2022 CLINICAL DATA:  A male age 36 presents for evaluation of bacteremia, confusion suspected abscess. EXAM: CT CHEST WITH CONTRAST TECHNIQUE: Multidetector CT imaging of the chest was performed during intravenous contrast administration. RADIATION DOSE REDUCTION: This exam was performed according to the departmental dose-optimization program  which includes automated exposure control, adjustment of the mA and/or kV according to patient size and/or use of iterative reconstruction technique. CONTRAST:  OMNIPAQUE IOHEXOL 300 MG/ML  SOLN COMPARISON:  Apr 02, 2021. FINDINGS: Cardiovascular: Extensive 3 vessel coronary artery calcification. Heart size top normal without pericardial effusion or signs of pericardial thickening. Aorta of normal caliber with calcified and noncalcified atheromatous plaque. Central pulmonary vasculature is unremarkable on venous phase. Mediastinum/Nodes: No thoracic inlet, axillary, mediastinal or hilar adenopathy. Esophagus grossly normal. Lungs/Pleura: Basilar atelectasis. Trace pleural fluid. Mild scarring at the RIGHT lung apex. No pneumothorax. Airways are patent. Upper Abdomen: Hepatic steatosis. Upper abdominal assessment limited by beam hardening artifact from hardware along the LEFT flank related to monitoring equipment and due to arm positioning. Suspect hepatic steatosis. No acute findings relative to pancreas, spleen, adrenal glands or kidneys. No acute gastrointestinal process. Musculoskeletal: No acute bone finding. No destructive bone process. Spinal degenerative changes. IMPRESSION: 1. Patchy basilar airspace disease likely atelectasis with signs of scarring in the LEFT upper lobe. No lobar consolidation. 2. Extensive 3 vessel coronary artery calcification. 3. Hepatic steatosis. 4. Aortic atherosclerosis. Aortic Atherosclerosis (ICD10-I70.0). Electronically Signed   By: Donzetta Kohut M.D.   On: 04/19/2022 08:23   DG Chest Port 1 View  Result Date: 04/18/2022 CLINICAL DATA:  71 year old male with history of hyperkalemia. Shortness of breath. Weakness. EXAM: PORTABLE CHEST 1 VIEW COMPARISON:  Chest x-ray 04/11/2022. FINDINGS: Lung volumes are low. No consolidative airspace disease. No pleural effusions. No pneumothorax. No pulmonary nodule or mass noted. Pulmonary vasculature and the cardiomediastinal  silhouette are within normal limits. Atherosclerotic calcifications in the thoracic aorta. IMPRESSION: 1. Low lung volumes without radiographic evidence of acute cardiopulmonary disease. 2. Aortic atherosclerosis. Electronically Signed   By: Trudie Reed M.D.   On: 04/18/2022 09:25     LOS: 19 days   Signature  Susa Raring M.D on 04/19/2022 at 11:02 AM   -  To page go to [email or web address]

## 2022-04-19 NOTE — Consult Note (Addendum)
WOC Nurse wound follow up Refer to previous WOC consult notes.  Pt has an Unstageable pressure injury to sacrum/bilat buttocks; this was noted as present on admission.  Surgical team now following for assessment and plan of care; please refer to their notes on 5/20. " Given that an abscess spontaneously just decompressed, would trial irrigation/dressings with quarter percent Dakin's for the next 2 to 3 days while his Xarelto is being held.  Okay with heparin drip as per primary.  Continue with nutrition as per primary.  We will plan to reassess the wound. Hopefully with good wound care, Dakin's and time this cleans up. She he have significant non-viable tissue precluding wound healing, can plan for surgical debridement." WOC team will not plan to follow further.  Please refer to surgical team for further questions regarding plan of care and reconsult if further assistance is needed.  Please re-consult if further assistance is needed.  Thank-you,  Cammie Mcgee MSN, RN, CWOCN, [place], CNS [phone]

## 2022-04-19 NOTE — Progress Notes (Signed)
[facility]  Date of Admission:  03/30/2022      Total days of antibiotics 19            ASSESSMENT: Christian [name] is a 71 y.o. male admitted from SNF after they noticed significant generalized weakness. Found to have Streptococcus constellatus bacteremia (I-pcn, ctx).  No fevers since 5/20 (103 F) on daptomycin + augmentin. Tolerating these well from what I can tell. The ulceration on the lateral aspect of left lower leg is completely healed over compared to 16m ago. Sacral ulcer is bothering him and has un-stageable wound with full thickness skin loss +  eschar with recently spontaneously drained abscess. Trying Dakins for wound management to see if this helps, otherwise considering surgical debridement (Surgical team following). CT chest unremarkable. CT a/p with ill-defined subcutaneous fluid and soft tissue emphysema, no bone destruction at this point.  TEE planned for 5/24 to follow up abnormal TTE - will follow up results and provide further recommendations for treatment.     PLAN: Continue daptomycin + augmentin for treatment of bacteremia and SSTI.  No findings of OM on ct scan.  TEE planned 5/24  Further recommendations to follow #2    Principal Problem:   Hyperkalemia Active Problems:   Bacteremia   Diabetes mellitus type 2 in nonobese (HCC)   Sacral decubitus ulcer, stage II (HCC)   Severe sepsis without septic shock (HCC)   Hypoglycemia    amoxicillin-clavulanate  1 tablet Oral Q12H   ARIPiprazole  10 mg Oral q morning   benztropine  1 mg Oral BID   Chlorhexidine Gluconate Cloth  6 each Topical Daily   diclofenac Sodium  2 g Topical QID   enoxaparin (LOVENOX) injection  100 mg Subcutaneous Q12H   insulin aspart  0-9 Units Subcutaneous TID WC   insulin glargine-yfgn  10 Units Subcutaneous Daily   liothyronine  10 mcg Oral Daily   mouth rinse  15 mL Mouth Rinse BID   multivitamin with minerals  1 tablet Oral Daily    senna-docusate  2 tablet Oral BID   sodium hypochlorite   Irrigation BID    SUBJECTIVE: Buttocks hurts. Waiting for pain medication. Feeling OK otherwise.    Review of Systems: Review of Systems  Constitutional:  Negative for chills and fever.     Allergies  Allergen Reactions   Amlodipine Swelling and Other (See Comments)    Legs became swollen   Onglyza [Saxagliptin] Other (See Comments)    "Allergic," per [facility]   Zyprexa [Olanzapine] Other (See Comments)    Allergy noted by ACT Team. No reaction specified (??)- "Allergic," per [facility]   Ritalin [Methylphenidate] Anxiety and Other (See Comments)    "Man[place]"    OBJECTIVE: Vitals:   04/18/22 2300 04/19/22 0330 04/19/22 0834 04/19/22 1155  BP: 135/85 126/74 131/78 127/74  Pulse: 93 86    Resp: 15 20 12 19   Temp: 98 F (36.7 C) 97.9 F (36.6 C) 99.2 F (37.3 C) 98.7 F (37.1 C)  TempSrc: Oral Oral Oral Oral  SpO2: 95% 94%    Weight:      Height:       Body mass index is 32.83 kg/m.  Physical Exam Constitutional:      Appearance: He is not ill-appearing.  Eyes:     Pupils: Pupils are equal, round, and reactive to light.  Cardiovascular:     Rate and Rhythm: Normal  rate and regular rhythm.     Heart sounds: No murmur heard. Pulmonary:     Effort: Pulmonary effort is normal.     Breath sounds: Normal breath sounds.  Abdominal:     General: Bowel sounds are normal.     Palpations: Abdomen is soft.  Musculoskeletal:        General: Swelling present.     Comments: LLE with healed previous ulcer. Unable to assess sacral wound today.   Skin:    General: Skin is warm and dry.     Capillary Refill: Capillary refill takes less than 2 seconds.  Neurological:     Mental Status: He is alert and oriented to person, place, and time.    Lab Results Lab Results  Component Value Date   WBC 6.5 04/19/2022   HGB 10.5 (L) 04/19/2022   HCT 33.3 (L) 04/19/2022   MCV 85.2 04/19/2022   PLT 293  04/19/2022    Lab Results  Component Value Date   CREATININE 1.00 04/19/2022   BUN 23 04/19/2022   NA 143 04/19/2022   K 3.5 04/19/2022   CL 116 (H) 04/19/2022   CO2 22 04/19/2022    Lab Results  Component Value Date   ALT 24 04/19/2022   AST 30 04/19/2022   ALKPHOS 58 04/19/2022   BILITOT 0.6 04/19/2022     Microbiology: Recent Results (from the past 240 hour(s))  Culture, blood (Routine X 2) w Reflex to ID Panel     Status: Abnormal   Collection Time: 04/11/22  4:22 AM   Specimen: BLOOD RIGHT HAND  Result Value Ref Range Status   Specimen Description BLOOD RIGHT HAND  Final   Special Requests   Final    BOTTLES DRAWN AEROBIC ONLY Blood Culture adequate volume   Culture  Setup Time   Final    GRAM POSITIVE COCCI IN CHAINS AEROBIC BOTTLE ONLY CRITICAL RESULT CALLED TO, READ BACK BY AND VERIFIED WITH: T RUDISILL,PHARMD@0612  04/12/22 MK Performed at [facility], [address], [place], [place] [address]    Culture STREPTOCOCCUS CONSTELLATUS (A)  Final   Report Status 04/14/2022 FINAL  Final   Organism ID, Bacteria STREPTOCOCCUS CONSTELLATUS  Final      Susceptibility   Streptococcus constellatus - MIC*    PENICILLIN INTERMEDIATE Intermediate     CEFTRIAXONE 2 INTERMEDIATE Intermediate     ERYTHROMYCIN <=0.12 SENSITIVE Sensitive     LEVOFLOXACIN 0.5 SENSITIVE Sensitive     VANCOMYCIN 0.5 SENSITIVE Sensitive     * STREPTOCOCCUS CONSTELLATUS  Blood Culture ID Panel (Reflexed)     Status: Abnormal   Collection Time: 04/11/22  4:22 AM  Result Value Ref Range Status   Enterococcus faecalis NOT DETECTED NOT DETECTED Final   Enterococcus Faecium NOT DETECTED NOT DETECTED Final   Listeria monocytogenes NOT DETECTED NOT DETECTED Final   Staphylococcus species NOT DETECTED NOT DETECTED Final   Staphylococcus aureus (BCID) NOT DETECTED NOT DETECTED Final   Staphylococcus epidermidis NOT DETECTED NOT DETECTED Final   Staphylococcus lugdunensis NOT DETECTED NOT DETECTED  Final   Streptococcus species DETECTED (A) NOT DETECTED Final    Comment: Not Enterococcus species, Streptococcus agalactiae, Streptococcus pyogenes, or Streptococcus pneumoniae. CRITICAL RESULT CALLED TO, READ BACK BY AND VERIFIED WITH: T RUDISILL,PHARMD@0612  04/12/22 MK    Streptococcus agalactiae NOT DETECTED NOT DETECTED Final   Streptococcus pneumoniae NOT DETECTED NOT DETECTED Final   Streptococcus pyogenes NOT DETECTED NOT DETECTED Final   A.calcoaceticus-baumannii NOT DETECTED NOT DETECTED Final  Bacteroides fragilis NOT DETECTED NOT DETECTED Final   Enterobacterales NOT DETECTED NOT DETECTED Final   Enterobacter cloacae complex NOT DETECTED NOT DETECTED Final   Escherichia coli NOT DETECTED NOT DETECTED Final   Klebsiella aerogenes NOT DETECTED NOT DETECTED Final   Klebsiella oxytoca NOT DETECTED NOT DETECTED Final   Klebsiella pneumoniae NOT DETECTED NOT DETECTED Final   Proteus species NOT DETECTED NOT DETECTED Final   Salmonella species NOT DETECTED NOT DETECTED Final   Serratia marcescens NOT DETECTED NOT DETECTED Final   Haemophilus influenzae NOT DETECTED NOT DETECTED Final   Neisseria meningitidis NOT DETECTED NOT DETECTED Final   Pseudomonas aeruginosa NOT DETECTED NOT DETECTED Final   Stenotrophomonas maltophilia NOT DETECTED NOT DETECTED Final   Candida albicans NOT DETECTED NOT DETECTED Final   Candida auris NOT DETECTED NOT DETECTED Final   Candida glabrata NOT DETECTED NOT DETECTED Final   Candida krusei NOT DETECTED NOT DETECTED Final   Candida parapsilosis NOT DETECTED NOT DETECTED Final   Candida tropicalis NOT DETECTED NOT DETECTED Final   Cryptococcus neoformans/gattii NOT DETECTED NOT DETECTED Final    Comment: Performed at [facility], [address], [place], [place] [address]  Culture, blood (Routine X 2) w Reflex to ID Panel     Status: Abnormal   Collection Time: 04/11/22  4:38 AM   Specimen: BLOOD  Result Value Ref Range Status   Specimen  Description BLOOD RIGHT ANTECUBITAL  Final   Special Requests   Final    BOTTLES DRAWN AEROBIC ONLY Blood Culture adequate volume   Culture  Setup Time   Final    GRAM POSITIVE COCCI IN CHAINS AEROBIC BOTTLE ONLY CRITICAL VALUE NOTED.  VALUE IS CONSISTENT WITH PREVIOUSLY REPORTED AND CALLED VALUE.    Culture (A)  Final    STREPTOCOCCUS CONSTELLATUS SUSCEPTIBILITIES PERFORMED ON PREVIOUS CULTURE WITHIN THE LAST 5 DAYS. Performed at [facility], [address] [place], [place] [address]    Report Status 04/14/2022 FINAL  Final  Culture, blood (Routine X 2) w Reflex to ID Panel     Status: None   Collection Time: 04/12/22 12:20 PM   Specimen: BLOOD  Result Value Ref Range Status   Specimen Description BLOOD RIGHT ANTECUBITAL  Final   Special Requests   Final    BOTTLES DRAWN AEROBIC AND ANAEROBIC Blood Culture adequate volume   Culture   Final    NO GROWTH 5 DAYS Performed at [facility], [address], [place], [place] [address]    Report Status 04/17/2022 FINAL  Final  Culture, blood (Routine X 2) w Reflex to ID Panel     Status: None   Collection Time: 04/12/22 12:22 PM   Specimen: BLOOD LEFT HAND  Result Value Ref Range Status   Specimen Description BLOOD LEFT HAND  Final   Special Requests   Final    BOTTLES DRAWN AEROBIC ONLY Blood Culture results may not be optimal due to an inadequate volume of blood received in culture bottles   Culture   Final    NO GROWTH 5 DAYS Performed at [facility], [address] [place], [place] [address]    Report Status 04/17/2022 FINAL  Final  MRSA Next Gen by PCR, Nasal     Status: None   Collection Time: 04/17/22 11:12 AM   Specimen: Nasal Mucosa; Nasal Swab  Result Value Ref Range Status   MRSA by PCR Next Gen NOT DETECTED NOT DETECTED Final  Comment: (NOTE) The GeneXpert MRSA Assay (FDA approved for NASAL specimens only), is one component of a comprehensive MRSA colonization surveillance program. It is not  intended to diagnose MRSA infection nor to guide or monitor treatment for MRSA infections. Test performance is not FDA approved in patients less than 36 years old. Performed at [facility], [address], [place], [place] [address]      Rexene Alberts, MSN, NP-C [facility]  [email or web address] Pager: [phone] Office: [phone] [facility] Main Line: [phone] *Secure Chat Communication Welcome

## 2022-04-19 NOTE — [place] FL2 (Signed)
[place] MEDICAID FL2 LEVEL OF CARE SCREENING TOOL     IDENTIFICATION  Patient Name: Christian [name] Birthdate: May 08, 1951 Sex: male Admission Date (Current Location): 03/30/2022  [facility] and [place] Number:  Producer, television/film/video and Address:  [facility], [address] [place], [place] [address]      Provider Number: [id number]  Attending Physician Name and Address:  Leroy Sea, MD  Relative Name and Phone Number:       Current Level of Care: Hospital Recommended Level of Care: Skilled Nursing Facility Prior Approval Number:    Date Approved/Denied:   PASRR Number: [id number] E, expires 05/19/22  Discharge Plan: SNF    Current Diagnoses: Patient Active Problem List   Diagnosis Date Noted   Hypoglycemia 03/12/2022   Cellulitis in diabetic foot (HCC) 01/26/2022   Diabetic ulcer of ankle (HCC) 01/26/2022   Severe sepsis without septic shock (HCC) 04/02/2021   Lactic acidosis 04/02/2021   Hyponatremia 04/02/2021   Hyperkalemia 04/02/2021   Chronic kidney disease, stage 3b (HCC) 04/02/2021   Fever 04/02/2021   Septic shock (HCC) 04/02/2021   Altered mental status    AKI (acute kidney injury) (HCC)    CVA (cerebral vascular accident) (HCC) 10/07/2020   Type 2 diabetes mellitus with stage 3 chronic kidney disease (HCC) 04/01/2020   CAD (coronary artery disease), native coronary artery 04/01/2020   NSTEMI (non-ST elevated myocardial infarction) (HCC) 03/31/2020   Obesity, Class III, BMI 40-49.9 (morbid obesity) (HCC) 03/31/2020   Aspiration pneumonia of both lower lobes due to gastric secretions (HCC) 03/21/2020   Sacral decubitus ulcer, stage II (HCC) 03/19/2020   Chronic diastolic CHF (congestive heart failure) (HCC) 03/17/2020   Atrial flutter, paroxysmal (HCC) 12/27/2019   Constipation 09/16/2018   Bipolar 1 disorder, depressed (HCC) 08/22/2018   Encephalopathy    Paroxysmal atrial fibrillation (HCC) 06/18/2018   History of  pulmonary embolism 06/17/2018   Generalized weakness 03/27/2018   Diabetes mellitus type 2 in nonobese (HCC) 03/27/2018   Pulmonary embolism (HCC) 03/27/2018   Fall    Laceration of eyebrow    Bipolar I disorder, most recent episode depressed, severe w psychosis (HCC) 12/22/2015   Bipolar affective disorder, depressed, severe, with psychotic behavior (HCC)    Bipolar I disorder, current or most recent episode depressed, with psychotic features (HCC)    Severe bipolar I disorder with depression (HCC)    Bipolar I disorder, most recent episode depressed, severe with psychotic features (HCC)    Overdose 09/11/2015   Acute respiratory failure with hypoxia (HCC) 09/05/2015   Elevated troponin 09/05/2015   Somnolence 09/05/2015   Bipolar depression (HCC) 09/05/2015   Acute bilateral deep vein thrombosis (DVT) of femoral veins (HCC) 09/05/2015   Bilateral pulmonary embolism (HCC) 09/02/2015   Labile hypertension    Pulmonary embolism with acute cor pulmonale (HCC)    Dyspnea    Orthostatic hypotension    Bipolar disorder, in partial remission, most recent episode depressed (HCC)    Syncope, near 08/31/2015   Bipolar and related disorder (HCC)    Bipolar affective disorder, depressed, mild (HCC) 08/28/2015   Pressure ulcer 07/30/2015   Protein-calorie malnutrition, severe (HCC) 07/30/2015   Dehydration 07/29/2015   Diabetes (HCC) 07/09/2015   UTI (urinary tract infection) 06/19/2014   Bacteremia 06/19/2014   Bipolar affective disorder, current episode manic with psychotic symptoms (HCC) 06/17/2014   Type 2 diabetes mellitus with complication, without long-term current use of insulin (HCC) 06/02/2014   Other and unspecified  hyperlipidemia 06/02/2014   OSA on CPAP 06/02/2014   Bilateral lower extremity edema: chronic with venous stasis changes 06/02/2014   Gout 06/02/2014   Hypertension     Orientation RESPIRATION BLADDER Height & Weight     Self, Place, Time  Normal Incontinent,  Indwelling catheter Weight: 242 lb 1 oz (109.8 kg) Height:  6' (182.9 cm)  BEHAVIORAL SYMPTOMS/MOOD NEUROLOGICAL BOWEL NUTRITION STATUS      Continent Diet (See dc summary)  AMBULATORY STATUS COMMUNICATION OF NEEDS Skin   Extensive Assist Verbally PU Stage and Appropriate Care (Unstageable on sacrum w/foam dressing; venous stasis ulcer;)                       Personal Care Assistance Level of Assistance  Bathing, Feeding, Dressing Bathing Assistance: Maximum assistance Feeding assistance: Limited assistance Dressing Assistance: Limited assistance     Functional Limitations Warehouse manager, Speech Sight Info: Impaired   Speech Info: Impaired    SPECIAL CARE FACTORS FREQUENCY  PT (By licensed PT), OT (By licensed OT)     PT Frequency: 5x/week OT Frequency: 5x/week            Contractures Contractures Info: Not present    Additional Factors Info  Code Status, Allergies Code Status Info: Full Allergies Info: Amlodipine, Onglyza (Saxagliptin), Zyprexa (Olanzapine), Ritalin (Methylphenidate)           Current Medications (04/19/2022):  This is the current hospital active medication list Current Facility-Administered Medications  Medication Dose Route Frequency Provider Last Rate Last Admin   acetaminophen (TYLENOL) tablet 650 mg  650 mg Oral Q6H PRN Eduard Clos, MD   650 mg at 04/17/22 2040   Or   acetaminophen (TYLENOL) suppository 650 mg  650 mg Rectal Q6H PRN Eduard Clos, MD   650 mg at 04/11/22 1747   amoxicillin-clavulanate (AUGMENTIN) 875-125 MG per tablet 1 tablet  1 tablet Oral Q12H Vu, Trung T, MD   1 tablet at 04/19/22 0857   ARIPiprazole (ABILIFY) tablet 10 mg  10 mg Oral q morning Cinderella, Margaret A   10 mg at 04/19/22 0857   benztropine (COGENTIN) tablet 1 mg  1 mg Oral BID Elgergawy, Leana Roe, MD   1 mg at 04/19/22 0857   bisacodyl (DULCOLAX) EC tablet 10 mg  10 mg Oral Daily PRN Elgergawy, Leana Roe, MD   10 mg at 04/03/22 1627    Chlorhexidine Gluconate Cloth 2 % PADS 6 each  6 each Topical Daily Leroy Sea, MD   6 each at 04/19/22 1636   DAPTOmycin (CUBICIN) 700 mg in sodium chloride 0.9 % IVPB  8 mg/kg (Adjusted) Intravenous Q2000 Vu, Trung T, MD 128 mL/hr at 04/18/22 2000 700 mg at 04/18/22 2000   diclofenac Sodium (VOLTAREN) 1 % topical gel 2 g  2 g Topical QID Maretta Bees, MD   2 g at 04/19/22 1003   enoxaparin (LOVENOX) injection 100 mg  100 mg Subcutaneous Q12H Leroy Sea, MD   100 mg at 04/19/22 0858   HYDROcodone-acetaminophen (NORCO/VICODIN) 5-325 MG per tablet 1 tablet  1 tablet Oral Q4H PRN Elgergawy, Leana Roe, MD   1 tablet at 04/19/22 1003   insulin aspart (novoLOG) injection 0-9 Units  0-9 Units Subcutaneous TID WC Elgergawy, Leana Roe, MD   2 Units at 04/19/22 1700   insulin glargine-yfgn (SEMGLEE) injection 10 Units  10 Units Subcutaneous Daily Maretta Bees, MD   10 Units at 04/19/22 [phone]  lactated ringers infusion   Intravenous Continuous Leroy Sea, MD 50 mL/hr at 04/19/22 0127 New Bag at 04/19/22 0127   liothyronine (CYTOMEL) tablet 10 mcg  10 mcg Oral Daily Eduard Clos, MD   10 mcg at 04/19/22 5638   MEDLINE mouth rinse  15 mL Mouth Rinse BID Maretta Bees, MD   15 mL at 04/19/22 0858   melatonin tablet 10 mg  10 mg Oral QHS PRN Marinda Elk, MD   10 mg at 04/13/22 2135   multivitamin with minerals tablet 1 tablet  1 tablet Oral Daily Elgergawy, Leana Roe, MD   1 tablet at 04/19/22 0857   senna-docusate (Senokot-S) tablet 2 tablet  2 tablet Oral BID Elgergawy, Leana Roe, MD   2 tablet at 04/19/22 0857   sodium hypochlorite (DAKIN'S 1/4 STRENGTH) topical solution   Irrigation BID Andria Meuse, MD   Given at 04/18/22 2156     Discharge Medications: Please see discharge summary for a list of discharge medications.  Relevant Imaging Results:  Relevant Lab Results:   Additional Information SS#[id number]. Pfizer Covid-19 Vaccine Bivalent  Booster 01/28/22, 12/03/20, 03/04/20, 02/12/20  Mearl Latin, LCSW

## 2022-04-19 NOTE — Progress Notes (Signed)
Progress Note     Subjective: Pt seen with PT hydrotherapy. Wound cleaning up some, abscess cavity spontaneously draining over the weekend.   Objective: Vital signs in last 24 hours: Temp:  [97.8 F (36.6 C)-99.2 F (37.3 C)] 98.7 F (37.1 C) (05/22 1155) Pulse Rate:  [86-93] 86 (05/22 0330) Resp:  [12-20] 19 (05/22 1155) BP: (126-136)/(74-85) 127/74 (05/22 1155) SpO2:  [94 %-95 %] 94 % (05/22 0330) Last BM Date : 04/18/22  Intake/Output from previous day: 05/21 0701 - 05/22 0700 In: 794.4 [I.V.:730.4; IV Piggyback:64] Out: 750 [Urine:750] Intake/Output this shift: No intake/output data recorded.  PE: Some necrotic appearing tissue still in wound base but appears improved still from previous photos, tract extending 8.5 cm from wound inferiorly. Some bloody drainage.      Lab Results:  Recent Labs    04/18/22 0223 04/19/22 0210  WBC 9.5 6.5  HGB 10.5* 10.5*  HCT 34.0* 33.3*  PLT 281 293   BMET Recent Labs    04/18/22 0223 04/19/22 0210  NA 144 143  K 3.0* 3.5  CL 118* 116*  CO2 22 22  GLUCOSE 94 122*  BUN 27* 23  CREATININE 1.11 1.00  CALCIUM 9.0 8.9   PT/INR No results for input(s): LABPROT, INR in the last 72 hours. CMP     Component Value Date/Time   NA 143 04/19/2022 0210   NA 139 08/10/2014 1327   K 3.5 04/19/2022 0210   K 4.3 08/10/2014 1327   CL 116 (H) 04/19/2022 0210   CL 105 08/10/2014 1327   CO2 22 04/19/2022 0210   CO2 27 08/10/2014 1327   GLUCOSE 122 (H) 04/19/2022 0210   GLUCOSE 109 (H) 08/10/2014 1327   BUN 23 04/19/2022 0210   BUN 13 08/10/2014 1327   CREATININE 1.00 04/19/2022 0210   CREATININE 1.25 08/10/2014 1327   CALCIUM 8.9 04/19/2022 0210   CALCIUM 8.8 08/10/2014 1327   PROT 5.8 (L) 04/19/2022 0210   PROT 7.0 08/10/2014 1327   ALBUMIN 1.5 (L) 04/19/2022 0210   ALBUMIN 3.3 (L) 08/10/2014 1327   AST 30 04/19/2022 0210   AST 96 (H) 08/10/2014 1327   ALT 24 04/19/2022 0210   ALT 187 (H) 08/10/2014 1327   ALKPHOS  58 04/19/2022 0210   ALKPHOS 81 08/10/2014 1327   BILITOT 0.6 04/19/2022 0210   BILITOT 0.3 08/10/2014 1327   GFRNONAA >60 04/19/2022 0210   GFRNONAA >60 08/10/2014 1327   GFRAA 52 (L) 04/01/2020 0155   GFRAA >60 08/10/2014 1327   Lipase  No results found for: LIPASE     Studies/Results: CT HEAD W CONTRAST (5MM)  Result Date: 04/19/2022 CLINICAL DATA:  71 year old male with confusion and bacteremia. EXAM: CT HEAD WITH CONTRAST TECHNIQUE: Contiguous axial images were obtained from the base of the skull through the vertex with intravenous contrast. RADIATION DOSE REDUCTION: This exam was performed according to the departmental dose-optimization program which includes automated exposure control, adjustment of the mA and/or kV according to patient size and/or use of iterative reconstruction technique. CONTRAST:  116mL OMNIPAQUE IOHEXOL 300 MG/ML  SOLN COMPARISON:  Noncontrast head CT 03/31/2022.  Brain MRI 10/07/2020. FINDINGS: Brain: Postcontrast imaging only. Cerebral volume is stable. No midline shift, ventriculomegaly, mass effect, evidence of mass lesion, intracranial hemorrhage or evidence of cortically based acute infarction. Compared to 03/31/2022, No abnormal enhancement identified. Evidence of a small area of medial right occipital pole encephalomalacia, stable from recent CT (series 5, image 42 today). Elsewhere gray-white matter differentiation  appears to remain normal. Vascular: Extensive Calcified atherosclerosis at the skull base. The major intracranial vascular structures appear to be enhancing, patent. Skull: No acute osseous abnormality identified. Sinuses/Orbits: Visualized paranasal sinuses and mastoids are stable and well aerated. Other: No acute orbit or scalp soft tissue finding. Calcified scalp vessel atherosclerosis redemonstrated. IMPRESSION: 1. No acute intracranial abnormality on postcontrast CT. Small area of right PCA territory encephalomalacia suspected. 2. Advanced  calcified atherosclerosis. Electronically Signed   By: Genevie Ann M.D.   On: 04/19/2022 08:18   CT CHEST W CONTRAST  Result Date: 04/19/2022 CLINICAL DATA:  A male age 35 presents for evaluation of bacteremia, confusion suspected abscess. EXAM: CT CHEST WITH CONTRAST TECHNIQUE: Multidetector CT imaging of the chest was performed during intravenous contrast administration. RADIATION DOSE REDUCTION: This exam was performed according to the departmental dose-optimization program which includes automated exposure control, adjustment of the mA and/or kV according to patient size and/or use of iterative reconstruction technique. CONTRAST:  187mL OMNIPAQUE IOHEXOL 300 MG/ML  SOLN COMPARISON:  Apr 02, 2021. FINDINGS: Cardiovascular: Extensive 3 vessel coronary artery calcification. Heart size top normal without pericardial effusion or signs of pericardial thickening. Aorta of normal caliber with calcified and noncalcified atheromatous plaque. Central pulmonary vasculature is unremarkable on venous phase. Mediastinum/Nodes: No thoracic inlet, axillary, mediastinal or hilar adenopathy. Esophagus grossly normal. Lungs/Pleura: Basilar atelectasis. Trace pleural fluid. Mild scarring at the RIGHT lung apex. No pneumothorax. Airways are patent. Upper Abdomen: Hepatic steatosis. Upper abdominal assessment limited by beam hardening artifact from hardware along the LEFT flank related to monitoring equipment and due to arm positioning. Suspect hepatic steatosis. No acute findings relative to pancreas, spleen, adrenal glands or kidneys. No acute gastrointestinal process. Musculoskeletal: No acute bone finding. No destructive bone process. Spinal degenerative changes. IMPRESSION: 1. Patchy basilar airspace disease likely atelectasis with signs of scarring in the LEFT upper lobe. No lobar consolidation. 2. Extensive 3 vessel coronary artery calcification. 3. Hepatic steatosis. 4. Aortic atherosclerosis. Aortic Atherosclerosis  (ICD10-I70.0). Electronically Signed   By: Zetta Bills M.D.   On: 04/19/2022 08:23   DG Chest Port 1 View  Result Date: 04/18/2022 CLINICAL DATA:  71 year old male with history of hyperkalemia. Shortness of breath. Weakness. EXAM: PORTABLE CHEST 1 VIEW COMPARISON:  Chest x-ray 04/11/2022. FINDINGS: Lung volumes are low. No consolidative airspace disease. No pleural effusions. No pneumothorax. No pulmonary nodule or mass noted. Pulmonary vasculature and the cardiomediastinal silhouette are within normal limits. Atherosclerotic calcifications in the thoracic aorta. IMPRESSION: 1. Low lung volumes without radiographic evidence of acute cardiopulmonary disease. 2. Aortic atherosclerosis. Electronically Signed   By: Vinnie Langton M.D.   On: 04/18/2022 09:25    Anti-infectives: Anti-infectives (From admission, onward)    Start     Dose/Rate Route Frequency Ordered Stop   04/15/22 0900  vancomycin (VANCOREADY) IVPB 1500 mg/300 mL  Status:  Discontinued        1,500 mg 150 mL/hr over 120 Minutes Intravenous Every 24 hours 04/14/22 0811 04/14/22 1005   04/14/22 2200  amoxicillin-clavulanate (AUGMENTIN) 875-125 MG per tablet 1 tablet        1 tablet Oral Every 12 hours 04/14/22 1331     04/14/22 1100  DAPTOmycin (CUBICIN) 700 mg in sodium chloride 0.9 % IVPB        8 mg/kg  90.4 kg (Adjusted) 128 mL/hr over 30 Minutes Intravenous Daily 04/14/22 1005     04/14/22 0900  vancomycin (VANCOREADY) IVPB 2000 mg/400 mL  Status:  Discontinued  2,000 mg 200 mL/hr over 120 Minutes Intravenous  Once 04/14/22 0811 04/14/22 1005   04/13/22 2200  metroNIDAZOLE (FLAGYL) tablet 500 mg  Status:  Discontinued        500 mg Oral Every 12 hours 04/13/22 1436 04/14/22 1331   04/12/22 0815  cefTRIAXone (ROCEPHIN) 2 g in sodium chloride 0.9 % 100 mL IVPB  Status:  Discontinued        2 g 200 mL/hr over 30 Minutes Intravenous Daily 04/12/22 0717 04/14/22 0811   04/12/22 0600  vancomycin (VANCOREADY) IVPB 1250  mg/250 mL  Status:  Discontinued        1,250 mg 166.7 mL/hr over 90 Minutes Intravenous Every 24 hours 04/11/22 0521 04/12/22 0717   04/11/22 1145  metroNIDAZOLE (FLAGYL) IVPB 500 mg  Status:  Discontinued        500 mg 100 mL/hr over 60 Minutes Intravenous Every 12 hours 04/11/22 1055 04/13/22 1436   04/11/22 0600  ceFEPIme (MAXIPIME) 2 g in sodium chloride 0.9 % 100 mL IVPB  Status:  Discontinued        2 g 200 mL/hr over 30 Minutes Intravenous Every 8 hours 04/11/22 0437 04/12/22 0717   04/11/22 0530  vancomycin (VANCOREADY) IVPB 2000 mg/400 mL        2,000 mg 200 mL/hr over 120 Minutes Intravenous  Once 04/11/22 0437 04/11/22 2008        Assessment/Plan Sacral decubitus ulcer, unstageable - getting hydrotherapy M/W/F - getting dressing changes with Dakin's  - would not recommend surgical debridement at this point in time, as wound is cleaning up with current treatment - will see again Wednesday with hydrotherapy, but if wound continues to improve at that time and no surgical intervention indicated then we will sign off at that time and patient could follow up with [facility] inpatient and in wound care clinic as an outpatient  LOS: 19 days     Norm Parcel, [employer] [facility] Surgery 04/19/2022, 12:17 PM Please see Amion for pager number during day hours 7:00am-4:30pm

## 2022-04-19 NOTE — Progress Notes (Signed)
Physical Therapy Wound Treatment Patient Details  Name: Christian [name] MRN: [id number] Date of Birth: 1951-08-20  Today's Date: 04/19/2022 Time: 7619-5093 Time Calculation (min): 53 min  Subjective  Subjective Assessment Subjective: Pt agreeable to hydrotherapy, asking for water Patient and Family Stated Goals: None stated Date of Onset:  (Unknown) Prior Treatments: Dressing changes  Pain Score:  Pt moaning at times during session; premedicated.   Wound Assessment  Pressure Injury 03/31/22 Sacrum Mid Unstageable - Full thickness tissue loss in which the base of the injury is covered by slough (yellow, tan, gray, green or brown) and/or eschar (tan, brown or black) in the wound bed. (Active)  Dressing Type Foam - Lift dressing to assess site every shift;Dakin's-soaked gauze;Gauze (Comment);Barrier Film (skin prep);Moist to moist 04/19/22 1515  Dressing Clean, Dry, Intact;Changed 04/19/22 1515  Dressing Change Frequency Twice a day 04/19/22 1515  State of Healing Eschar 04/19/22 1515  Site / Wound Assessment Painful;Yellow;Pink;Red 04/19/22 1515  % Wound base Red or Granulating 60% 04/19/22 1515  % Wound base Yellow/Fibrinous Exudate 10% 04/19/22 1515  % Wound base Black/Eschar 30% 04/19/22 1515  % Wound base Other/Granulation Tissue (Comment) 0% 04/19/22 1515  Peri-wound Assessment Purple 04/19/22 1515  Wound Length (cm) 10 cm 04/17/22 1645  Wound Width (cm) 10 cm 04/17/22 1645  Wound Depth (cm) 0.5 cm 04/17/22 1645  Wound Surface Area (cm^2) 100 cm^2 04/17/22 1645  Wound Volume (cm^3) 50 cm^3 04/17/22 1645  Tunneling (cm) 0 04/17/22 1645  Undermining (cm) 0 04/17/22 1645  Margins Attached edges (approximated) 04/19/22 1515  Drainage Amount Moderate 04/19/22 1515  Drainage Description Purulent;Odor - foul;Sanguineous 04/19/22 1515  Treatment Debridement (Selective);Hydrotherapy (Pulse lavage);Other (Comment) 04/19/22 1515      Hydrotherapy Pulsed lavage therapy - wound  location: Sacrum Pulsed Lavage with Suction (psi): 12 psi Pulsed Lavage with Suction - Normal Saline Used: 1000 mL Pulsed Lavage Tip: Tip with splash shield Selective Debridement Selective Debridement - Location: Sacrum Selective Debridement - Tools Used: Forceps, Scalpel, Scissors Selective Debridement - Tissue Removed: Eschar and necrotic tissue    Wound Assessment and Plan  Wound Therapy - Assess/Plan/Recommendations Wound Therapy - Clinical Statement: Wound bed appears improved this session however continues to drain purulence from deep tunnel. Assessed wound with Claiborne Billings, PA during session. This patient will benefit from continued hydrotherapy for selective removal of unviable tissue, to decrease bioburden, and promote wound bed healing. Wound Therapy - Functional Problem List: Global weakness; apparently had not been out of bed since he got back to ALF from SNF ~1 week (PTA). Factors Delaying/Impairing Wound Healing: Diabetes Mellitus, Incontinence, Infection - systemic/local, Immobility, Multiple medical problems Hydrotherapy Plan: Debridement, Dressing change, Patient/family education, Pulsatile lavage with suction Wound Therapy - Frequency: 3X / week Wound Therapy - Follow Up Recommendations: dressing changes by RN  Wound Therapy Goals- Improve the function of patient's integumentary system by progressing the wound(s) through the phases of wound healing (inflammation - proliferation - remodeling) by: Wound Therapy Goals - Improve the function of patient's integumentary system by progressing the wound(s) through the phases of wound healing by: Decrease Necrotic Tissue to: 20% Decrease Necrotic Tissue - Progress: Progressing toward goal Increase Granulation Tissue to: 80% Increase Granulation Tissue - Progress: Progressing toward goal Improve Drainage Characteristics: Min, Serous Improve Drainage Characteristics - Progress: Progressing toward goal Goals/treatment plan/discharge plan  were made with and agreed upon by patient/family: Yes Time For Goal Achievement: 7 days Wound Therapy - Potential for Goals: Fair  Goals will be updated until maximal  potential achieved or discharge criteria met.  Discharge criteria: when goals achieved, discharge from hospital, MD decision/surgical intervention, no progress towards goals, refusal/missing three consecutive treatments without notification or medical reason.  GP     Charges PT Wound Care Charges $Wound Debridement up to 20 cm: < or equal to 20 cm $ Wound Debridement each add'l 20 sqcm: 2 $PT Hydrotherapy Dressing: 1 dressing $PT PLS Gun and Tip: 1 Supply $PT Hydrotherapy Visit: 1 Visit       Thelma Comp 04/19/2022, 3:20 PM  Rolinda Roan, PT, DPT Acute Rehabilitation Services Secure Chat Preferred Office: [phone]

## 2022-04-19 NOTE — Progress Notes (Signed)
Physical Therapy Treatment Patient Details Name: Christian [name] MRN: [id number] DOB: 08-24-1951 Today's Date: 04/19/2022   History of Present Illness 71 y.o. male presented 5/2 with worsening strength , new decubitus ulcers and poor intake.  Recent admission for cellulitis and hypoglycemia, had just returned to ALF from SNF a week ago but not OOB since then.  Has new hypoglyemia, elevation of K+, creatinine, CK.  Ulcer is not infected but has LE skin is also irritated and has skin breakdown. LEFt UE swollen and Doppler ordered 5/15 to r/o DVT.  PMHx:  chronic venous stasis and chronic right shin unhealing wound, chronic HFpEF, IDDM, CAD with stenting x2 in 2021, PVD, CKD stage IIIa, HTN, DVT on Xarelto, bipolar disorder, OSA on CPAP at bedtime    PT Comments    Patient seen in supine and chair position for exercises/ROM x 4 extremities. Patient remains very stiff and weak with some pain in rt knee and sacrum. Unable to attempt EOB with +1 assist. OT ordered to assist with addressing UEs, ADLs, and mobility. Will attempt to co-treat with OT for 2 sets of skilled hands for mobility.     Recommendations for follow up therapy are one component of a multi-disciplinary discharge planning process, led by the attending physician.  Recommendations may be updated based on patient status, additional functional criteria and insurance authorization.  Follow Up Recommendations  Skilled nursing-short term rehab (<3 hours/day)     Assistance Recommended at Discharge Frequent or constant Supervision/Assistance  Patient can return home with the following Two people to help with walking and/or transfers;Assistance with cooking/housework;Assist for transportation;Help with stairs or ramp for entrance;Two people to help with bathing/dressing/bathroom;Assistance with feeding;Direct supervision/assist for medications management;Direct supervision/assist for financial management   Equipment Recommendations  Other  (comment) (hoyer lift)    Recommendations for Other Services       Precautions / Restrictions Precautions Precautions: Fall Precaution Comments: monitor vitals, HR and BP esp Restrictions Weight Bearing Restrictions: No     Mobility  Bed Mobility Overal bed mobility: Needs Assistance Bed Mobility: Rolling Rolling: +2 for physical assistance, Total assist   Supine to sit:  (unable to achieve)     General bed mobility comments: up to sitting in chair position; pt able to grasp bil bed rails and pull head and tops of shoulders away from mattress towards sitting posiiton x 5 reps    Transfers                        Ambulation/Gait               General Gait Details: unable   Stairs             Wheelchair Mobility    Modified Rankin (Stroke Patients Only)       Balance                                            Cognition Arousal/Alertness: Awake/alert Behavior During Therapy: Flat affect Overall Cognitive Status: No family/caregiver present to determine baseline cognitive functioning                                 General Comments: Follows 1 step commands with incr time. Slow processing        Exercises General  Exercises - Upper Extremity Shoulder Flexion: AAROM, Both, Supine, 5 reps (Rt shoulder > ROM than lt) Elbow Extension: AAROM, Both, Supine (prolonged stretch to maintain elbow extension) Digit Composite Flexion: AROM, Both, 5 reps Composite Extension: AAROM, Both, 5 reps General Exercises - Lower Extremity Ankle Circles/Pumps: AAROM, Both, 5 reps (more assist on PF than DF) Short Arc Quad:  (chair position) [employer]: AROM, Both, Seated, 5 reps Heel Slides: Both, 5 reps, Supine, AAROM Hip ABduction/ADduction: Both, Supine, AAROM, 10 reps    General Comments        Pertinent Vitals/Pain Pain Assessment Pain Assessment: Faces Faces Pain Scale: Hurts even more Pain Location: sacrum,  Rt knee Pain Descriptors / Indicators: Grimacing, Guarding Pain Intervention(s): Limited activity within patient's tolerance, Monitored during session, Premedicated before session, Repositioned    Home Living Family/patient expects to be discharged to:: Skilled nursing facility Living Arrangements: Other (Comment) (congregate care) Available Help at Discharge: Family;Available PRN/intermittently Type of Home: Assisted living Home Access: Level entry       Home Layout: One level Home Equipment: Rollator (4 wheels);Shower seat;Grab bars - toilet;Grab bars - tub/shower;Wheelchair - Education administrator (comment) (wc is borrowed,  has Risk analyst) Additional Comments: asking for a hosptial bed    Prior Function            PT Goals (current goals can now be found in the care plan section) Acute Rehab PT Goals Patient Stated Goal: to not fall Time For Goal Achievement: 04/26/22 Potential to Achieve Goals: Fair Progress towards PT goals: Not progressing toward goals - comment (remains very stiff with some pain with ROM)    Frequency    Min 2X/week      PT Plan Current plan remains appropriate    Co-evaluation              AM-PAC PT "6 Clicks" Mobility   Outcome Measure  Help needed turning from your back to your side while in a flat bed without using bedrails?: Total Help needed moving from lying on your back to sitting on the side of a flat bed without using bedrails?: Total Help needed moving to and from a bed to a chair (including a wheelchair)?: Total Help needed standing up from a chair using your arms (e.g., wheelchair or bedside chair)?: Total Help needed to walk in hospital room?: Total Help needed climbing 3-5 steps with a railing? : Total 6 Click Score: 6    End of Session   Activity Tolerance: Patient limited by pain Patient left: in bed;with call bell/phone within reach Nurse Communication: Mobility status PT Visit Diagnosis: Unsteadiness on feet  (R26.81);History of falling (Z91.81);Repeated falls (R29.6);Difficulty in walking, not elsewhere classified (R26.2)     Time: [id number] PT Time Calculation (min) (ACUTE ONLY): 33 min  Charges:  $Therapeutic Exercise: 23-37 mins                      Arby Barrette, PT Acute Rehabilitation Services  Pager [phone] Office [phone]    Rexanne Mano 04/19/2022, 1:59 PM

## 2022-04-20 DIAGNOSIS — E875 Hyperkalemia: Secondary | ICD-10-CM | POA: Diagnosis not present

## 2022-04-20 LAB — COMPREHENSIVE METABOLIC PANEL
ALT: 28 U/L (ref 0–44)
AST: 37 U/L (ref 15–41)
Albumin: 1.6 g/dL — ABNORMAL LOW (ref 3.5–5.0)
Alkaline Phosphatase: 62 U/L (ref 38–126)
Anion gap: 6 (ref 5–15)
BUN: 17 mg/dL (ref 8–23)
CO2: 21 mmol/L — ABNORMAL LOW (ref 22–32)
Calcium: 8.8 mg/dL — ABNORMAL LOW (ref 8.9–10.3)
Chloride: 113 mmol/L — ABNORMAL HIGH (ref 98–111)
Creatinine, Ser: 0.93 mg/dL (ref 0.61–1.24)
GFR, Estimated: >60 mL/min (ref >60)
Glucose, Bld: 89 mg/dL (ref 70–99)
Potassium: 3.6 mmol/L (ref 3.5–5.1)
Sodium: 140 mmol/L (ref 135–145)
Total Bilirubin: 0.8 mg/dL (ref 0.3–1.2)
Total Protein: 6.2 g/dL — ABNORMAL LOW (ref 6.5–8.1)

## 2022-04-20 LAB — CBC WITH DIFFERENTIAL/PLATELET
Abs Immature Granulocytes: 0.03 10*3/uL (ref 0.00–0.07)
Basophils Absolute: 0 10*3/uL (ref 0.0–0.1)
Basophils Relative: 0 %
Eosinophils Absolute: 0.1 10*3/uL (ref 0.0–0.5)
Eosinophils Relative: 2 %
HCT: 38.3 % — ABNORMAL LOW (ref 39.0–52.0)
Hemoglobin: 12.2 g/dL — ABNORMAL LOW (ref 13.0–17.0)
Immature Granulocytes: 1 %
Lymphocytes Relative: 24 %
Lymphs Abs: 1.4 10*3/uL (ref 0.7–4.0)
MCH: 27 pg (ref 26.0–34.0)
MCHC: 31.9 g/dL (ref 30.0–36.0)
MCV: 84.7 fL (ref 80.0–100.0)
Monocytes Absolute: 0.3 10*3/uL (ref 0.1–1.0)
Monocytes Relative: 6 %
Neutro Abs: 3.7 10*3/uL (ref 1.7–7.7)
Neutrophils Relative %: 67 %
Platelets: 293 10*3/uL (ref 150–400)
RBC: 4.52 MIL/uL (ref 4.22–5.81)
RDW: 17.4 % — ABNORMAL HIGH (ref 11.5–15.5)
WBC: 5.6 10*3/uL (ref 4.0–10.5)
nRBC: 0 % (ref 0.0–0.2)

## 2022-04-20 LAB — GLUCOSE, CAPILLARY
Glucose-Capillary: 92 mg/dL (ref 70–99)
Glucose-Capillary: 100 mg/dL — ABNORMAL HIGH (ref 70–99)
Glucose-Capillary: 209 mg/dL — ABNORMAL HIGH (ref 70–99)
Glucose-Capillary: 204 mg/dL — ABNORMAL HIGH (ref 70–99)

## 2022-04-20 LAB — MAGNESIUM: Magnesium: 1.8 mg/dL (ref 1.7–2.4)

## 2022-04-20 LAB — BRAIN NATRIURETIC PEPTIDE: B Natriuretic Peptide: 635 pg/mL — ABNORMAL HIGH (ref 0.0–100.0)

## 2022-04-20 LAB — PROCALCITONIN: Procalcitonin: 0.12 ng/mL

## 2022-04-20 LAB — C-REACTIVE PROTEIN: CRP: 10.1 mg/dL — ABNORMAL HIGH (ref <1.0)

## 2022-04-20 MED ORDER — SODIUM CHLORIDE 0.9 % IV SOLN
INTRAVENOUS | Status: DC
Start: 2022-04-20 — End: 2022-04-21
  Administered 2022-04-21: 500 mL via INTRAVENOUS

## 2022-04-20 MED ORDER — ENSURE ENLIVE PO LIQD
237.0000 mL | Freq: Two times a day (BID) | ORAL | Status: DC
  Administered 2022-04-20 – 2022-04-27 (×12): 237 mL via ORAL

## 2022-04-20 MED ORDER — NYSTATIN 100000 UNIT/ML MT SUSP
5.0000 mL | Freq: Four times a day (QID) | OROMUCOSAL | Status: AC
  Administered 2022-04-20 – 2022-04-22 (×11): 500000 [IU] via OROMUCOSAL
  Filled 2022-04-20 (×8): qty 5

## 2022-04-20 MED ORDER — JUVEN PO PACK
1.0000 | PACK | Freq: Two times a day (BID) | ORAL | Status: DC
  Administered 2022-04-21 – 2022-05-20 (×54): 1 via ORAL
  Filled 2022-04-20 (×49): qty 1

## 2022-04-20 NOTE — Progress Notes (Signed)
Nutrition Follow-up  DOCUMENTATION CODES:   Not applicable  INTERVENTION:   Continue Multivitamin w/ minerals daily Ensure Enlive po BID, each supplement provides 350 kcal and 20 grams of protein. Discontinue Vital Cuisine Shake TID 1 packet Juven BID, each packet provides 95 calories, 2.5 grams of protein (collagen), and 9.8 grams of carbohydrate (3 grams sugar); also contains 7 grams of L-arginine and L-glutamine, 300 mg vitamin C, 15 mg vitamin E, 1.2 mcg vitamin B-12, 9.5 mg zinc, 200 mg calcium, and 1.5 g  Calcium Beta-hydroxy-Beta-methylbutyrate to support wound healing Feeding assist with all meals  NUTRITION DIAGNOSIS:   Increased nutrient needs related to chronic illness, wound healing as evidenced by estimated needs. - Ongoing  GOAL:   Patient will meet greater than or equal to 90% of their needs - Ongoing  MONITOR:   PO intake, Supplement acceptance, Labs, Skin  REASON FOR ASSESSMENT:   Consult Assessment of nutrition requirement/status  ASSESSMENT:   71 y.o. male presented to the ED with generalized weakness. Pt recently discharged from rehab after hospital admission due to hypoglycemia cellulitis. PMH includes CHF, CKD III, HTN, and T2DM. Pt admitted with hypoglycemia, hyperkalemia, and generalized weakness.   5/11 - diet downgraded to Dysphagia 1, Nectar Thick Liquids 5/15 - NPO 5/16 - diet advanced to Dysphagia 1, Nectar Thick Liquids 5/18 - diet advanced to thin liquids 5/19 - Hydrotherapy started  Pt resting in bed. Reports that he is doing good.  Pt states that he is eating a little better and that he is not having difficulty with pureed foods.  Per EMR, pt PO intake includes: 5/17: Breakfast 25%, Lunch 25% 5/18: Breakfast 30% 5/19: Lunch 25% 5/20: Breakfast 100% 5/22: Breakfast 20%, Lunch 30% 5/23: Breakfast 20%  Discussed adding Ensure back on since pt was upgraded to thin liquids; pt agreeable to drinking 2 Ensure's per day. Discussed  importance of protein intake to promote wound healing, pt expressed understanding.   Medications reviewed and include: Augmenting, SSI 0-9 units TID, Semglee, MVI, Senokot, IV antibiotics Labs reviewed: BUN 36, Creatinine 1.40, 24 hr CBG 104-159  Diet Order:   Diet Order             DIET - DYS 1 Room service appropriate? Yes with Assist; Fluid consistency: Thin  Diet effective now                  EDUCATION NEEDS:   No education needs have been identified at this time  Skin:  Skin Assessment: Skin Integrity Issues: Skin Integrity Issues:: Unstageable Stage III: Sacrum Unstageable: Coccyx & Buttocks  Last BM:  5/21 - Type 5  Height:  Ht Readings from Last 1 Encounters:  04/17/22 6' (1.829 m)   Weight:  Wt Readings from Last 1 Encounters:  04/17/22 109.8 kg   Ideal Body Weight:  80.9 kg  BMI:  Body mass index is 32.83 kg/m.  Estimated Nutritional Needs:  Kcal:  2100-2300 Protein:  105-120 grams Fluid:  >/= 2.1 L   Hermina Barters RD, LDN Clinical Dietitian See [facility] for contact information.

## 2022-04-20 NOTE — Progress Notes (Signed)
PROGRESS NOTE        PATIENT DETAILS Name: Christian [name] Age: 71 y.o. Sex: male Date of Birth: 07-Jan-1951 Admit Date: 03/30/2022 Admitting Physician Rise Patience, MD PCP:Sun, Gari Crown, MD  Brief Summary: Patient is a 71 y.o.  male with a history of A-fib on Xarelto, HFpEF, PAD, DM-2, bipolar disorder-presented with generalized weakness/sacral decubitus ulcer-was found to have hypoglycemia, hyperkalemia and subsequently admitted to the hospitalist service-see below for further details.  Thought to have extrapyramidal symptoms-evaluated by psych/neurology-psych medications adjusted with clinical improvement.  Unfortunately-further hospital course complicated by development of fever-due to probable Streptococcus bacteremia and infected decubitus ulcer.  See below for further details.  Significant events: 5/2>> admit to [facility] for weakness/hypoglycemia/hyperkalemia/rigidity-thought to have EPS 5/14>> febrile/tachycardic-Per nursing staff-discharge from prior existing sacral decubitus ulcer.  Started on broad-spectrum IV antibiotics.  Prelim blood culture positive for Streptococcus species. 5/19>> wound care starting hydrotherapy   Significant studies: 5/3>> x-ray pelvis: No fracture. 5/3>> CT head: No acute intracranial findings. 5/3>> x-ray left shoulder: Rotator cuff impingement 5/14>> CXR:No Obvious PNA 5/15>> left upper extremity Doppler: No DVT. 5/16>> CT abdomen/pelvis: Inferior sacral decubitus ulceration-no evidence of osteomyelitis. 5/16>> Echo: EF 45-50%, no obvious vegetations.  Significant microbiology data: 5/14>> blood culture: Streptococcus constellatus  5/15>> blood culture: No growth  Procedures:   Consults:  Neurology, psychiatry, infectious disease, CCS, Pall care  Subjective: Patient in bed, appears comfortable, denies any headache, no fever, no chest pain or pressure, no shortness of breath , no abdominal pain. No new focal  weakness.   Objective: Vitals: Blood pressure (!) 141/85, pulse 83, temperature (!) 97.2 F (36.2 C), temperature source Oral, resp. rate 12, height 6' (1.829 m), weight 109.8 kg, SpO2 96 %.   Exam:  Awake Alert, No new F.N deficits but chronically bedbound, contracture boots in both legs, severe chronic generalized weakness Fowler.AT,PERRAL Supple Neck, No JVD,   Symmetrical Chest wall movement, Good air movement bilaterally, CTAB RRR,No Gallops, Rubs or new Murmurs,  +ve B.Sounds, Abd Soft, No tenderness,   No Cyanosis, Clubbing or edema     Assessment/Plan:  Rigidity/tremors/mildly elevated CK: Felt to be more of extrapyramidal syndrome rather than NMS at this point.  Managed with supportive care.  After neurology/psych evaluation-has been resumed on Abilify and Cogentin-Wellbutrin & Seroquel have been discontinued.  Sepsis due to possible strep bacteremia-sacral decubitus ulcer with infection and underlying sacral abscess: Sepsis physiology improving-continues to have low-grade fever-once afebrile switch from Dapto to Zyvox (end date 5/31) and continue Augmentin until 5/24.  CT abdomen on 5/16 without any sacral osteomyelitis-TTE without vegetation.  Wound care following, had evidence of self opening of her sacral abscess with drainage on 04/17/2022 during wound care session, general surgery consulted, they have seen the patient no surgical intervention at this time per general surgery will continue wound care.  Case discussed with ID repeat CT done on 04/19/2022 is nonacute, TEE requested as well as he is still spiking low-grade fevers - TEE due on 04/21/22.  Per ID  Once fever resolve will switch dapto to linezolid Finish dapto-->linezolid on 5/31 Finish amox-clav [id number]    Acute metabolic encephalopathy: Due to sepsis--has significant amount of debility/deconditioning at baseline.    AKI on CKD stage IIIa: AKI likely hemodynamically mediated-improved-creatinine close to baseline.    Mild rhabdomyolysis: Nontraumatic-improved with supportive care.  Hypokalemia: Replaced  Hypoglycemia:  Resolved with supportive care.  Do not resume Amaryl on discharge.  Hypothyroidism: Continue Cytomel.  PAF: Rate controlled-on Xarelto, currently switched to Lovenox for possible surgery if needed for sacral decubitus abscess.  PAD: Follow-up with vascular surgery in the outpatient setting  HFpEF with EF 45 to 50% on recent echocardiogram: Volume status stable-suspect has some amount of edema in lower extremities due to third spacing.  Demadex held due to sepsis physiology-should be able to resume over the next few days.  Bipolar disorder: Given concerns for EPS-on Abilify and Cogentin-Depakote/Wellbutrin and Seroquel held.  Left shoulder pain: Supportive care-likely rotator cuff injury.  Stable to be followed up by orthopedics in the outpatient setting  Left arm swelling: Likely due to IVF infiltration-could have superficial venous thrombosis-left upper extremity Doppler negative for DVT.  Supportive local care at this point.  Note patient is already on full dose anticoagulation for his A-fib.  OSA: CPAP nightly  DM-2 (A1c 7.6 on 4/15): CBG relatively stable-avoiding tight glycemic control-at risk for hypoglycemia-continue Semglee 10 units and SSI.  Recent Labs    04/19/22 1545 04/19/22 2052 04/20/22 0750  GLUCAP 156* 123* 92     Debility/deconditioning: Has had worsening of his chronic debility during this hospitalization-plans are of SNF on discharge.  Per family-he has been nonambulatory since his discharge from [facility] in April 2023.  This likely resulted in decubitus ulcer that the patient presented with.  Nutrition Status: Nutrition Problem: Increased nutrient needs Etiology: chronic illness, wound healing Signs/Symptoms: estimated needs Interventions: MVI, Hormel Shake  Pressure Ulcer: Pressure Injury 03/31/22 Sacrum Mid Unstageable - Full thickness tissue  loss in which the base of the injury is covered by slough (yellow, tan, gray, green or brown) and/or eschar (tan, brown or black) in the wound bed. (Active)  03/31/22 1933  Location: Sacrum  Location Orientation: Mid  Staging: Unstageable - Full thickness tissue loss in which the base of the injury is covered by slough (yellow, tan, gray, green or brown) and/or eschar (tan, brown or black) in the wound bed.  Wound Description (Comments):   Present on Admission: Yes  Dressing Type Foam - Lift dressing to assess site every shift;Dakin's-soaked gauze;Gauze (Comment);Barrier Film (skin prep);Moist to moist 04/19/22 1515   Obesity: Estimated body mass index is 32.83 kg/m as calculated from the following:   Height as of this encounter: 6' (1.829 m).   Weight as of this encounter: 109.8 kg.   Code status:   Code Status: Full Code   DVT Prophylaxis:    Family Communication: Daughter-[name]-[phone]-updated on 5/18/ 23 by previous MD.   Disposition Plan: Status is: Inpatient Remains inpatient appropriate because: Not stable for discharge-resolving sepsis physiology due to streptococcal bacteremia/infected sacral decubitus ulcer-continues to have low-grade fever-on IV daptomycin/Augmentin.  Hydrotherapy to be started on 5/19.   Planned Discharge Destination:Skilled nursing facility   Diet: Diet Order             DIET - DYS 1 Room service appropriate? Yes with Assist; Fluid consistency: Thin  Diet effective now                   Antimicrobial agents: MEDICATIONS: Scheduled Meds:  amoxicillin-clavulanate  1 tablet Oral Q12H   ARIPiprazole  10 mg Oral q morning   benztropine  1 mg Oral BID   Chlorhexidine Gluconate Cloth  6 each Topical Daily   diclofenac Sodium  2 g Topical QID   enoxaparin (LOVENOX) injection  100 mg Subcutaneous Q12H     insulin aspart  0-9 Units Subcutaneous TID WC   insulin glargine-yfgn  10 Units Subcutaneous Daily   liothyronine  10 mcg  Oral Daily   mouth rinse  15 mL Mouth Rinse BID   multivitamin with minerals  1 tablet Oral Daily   nystatin  5 mL Mouth/Throat QID   senna-docusate  2 tablet Oral BID   Continuous Infusions:  DAPTOmycin (CUBICIN)  IV 700 mg (04/19/22 2049)   lactated ringers 50 mL/hr at 04/19/22 0127   PRN Meds:.acetaminophen **OR** acetaminophen, bisacodyl, HYDROcodone-acetaminophen, melatonin   I have personally reviewed following labs and imaging studies  LABORATORY DATA:  Recent Labs  Lab 04/14/22 0117 04/16/22 0234 04/18/22 0223 04/19/22 0210 04/20/22 0245  WBC 10.0 11.4* 9.5 6.5 5.6  HGB 13.0 11.8* 10.5* 10.5* 12.2*  HCT 41.7 36.9* 34.0* 33.3* 38.3*  PLT 251 245 281 293 293  MCV 86.0 83.5 85.6 85.2 84.7  MCH 26.8 26.7 26.4 26.9 27.0  MCHC 31.2 32.0 30.9 31.5 31.9  RDW 17.6* 17.7* 17.6* 17.6* 17.4*  LYMPHSABS  --   --  1.5 1.0 1.4  MONOABS  --   --  0.5 0.4 0.3  EOSABS  --   --  0.1 0.2 0.1  BASOSABS  --   --  0.0 0.0 0.0    Recent Labs  Lab 04/14/22 0148 04/15/22 0157 04/16/22 0234 04/17/22 1121 04/18/22 0223 04/18/22 0918 04/19/22 0210 04/20/22 0245  NA 139 141 142  --  144  --  143 140  K 4.3 5.3* 3.6  --  3.0*  --  3.5 3.6  CL 110 114* 113*  --  118*  --  116* 113*  CO2 22 17* 23  --  22  --  22 21*  GLUCOSE 156* 173* 120*  --  94  --  122* 89  BUN 34* 37* 37*  --  27*  --  23 17  CREATININE 1.21 1.39* 1.28*  --  1.11  --  1.00 0.93  CALCIUM 9.2 8.8* 9.1  --  9.0  --  8.9 8.8*  AST  --   --   --   --  30  --  30 37  ALT  --   --   --   --  24  --  24 28  ALKPHOS  --   --   --   --  55  --  58 62  BILITOT  --   --   --   --  0.5  --  0.6 0.8  ALBUMIN  --   --   --   --  <1.5*  --  1.5* 1.6*  MG 2.1  --   --   --  2.0  --  1.8 1.8  CRP  --   --   --   --  17.9*  --  13.9* 10.1*  PROCALCITON  --   --   --  0.27 0.24  --  0.11 0.12  AMMONIA  --   --   --   --   --  23  --   --   BNP  --   --   --   --  360.3*  --  596.1* 635.0*   RADIOLOGY STUDIES/RESULTS: CT  HEAD W CONTRAST (5MM)  Result Date: 04/19/2022 CLINICAL DATA:  71 year old male with confusion and bacteremia. EXAM: CT HEAD WITH CONTRAST TECHNIQUE: Contiguous axial images were obtained from the base of the skull through the vertex  with intravenous contrast. RADIATION DOSE REDUCTION: This exam was performed according to the departmental dose-optimization program which includes automated exposure control, adjustment of the mA and/or kV according to patient size and/or use of iterative reconstruction technique. CONTRAST:  177mL OMNIPAQUE IOHEXOL 300 MG/ML  SOLN COMPARISON:  Noncontrast head CT 03/31/2022.  Brain MRI 10/07/2020. FINDINGS: Brain: Postcontrast imaging only. Cerebral volume is stable. No midline shift, ventriculomegaly, mass effect, evidence of mass lesion, intracranial hemorrhage or evidence of cortically based acute infarction. Compared to 03/31/2022, No abnormal enhancement identified. Evidence of a small area of medial right occipital pole encephalomalacia, stable from recent CT (series 5, image 42 today). Elsewhere gray-white matter differentiation appears to remain normal. Vascular: Extensive Calcified atherosclerosis at the skull base. The major intracranial vascular structures appear to be enhancing, patent. Skull: No acute osseous abnormality identified. Sinuses/Orbits: Visualized paranasal sinuses and mastoids are stable and well aerated. Other: No acute orbit or scalp soft tissue finding. Calcified scalp vessel atherosclerosis redemonstrated. IMPRESSION: 1. No acute intracranial abnormality on postcontrast CT. Small area of right PCA territory encephalomalacia suspected. 2. Advanced calcified atherosclerosis. Electronically Signed   By: Genevie Ann M.D.   On: 04/19/2022 08:18   CT CHEST W CONTRAST  Result Date: 04/19/2022 CLINICAL DATA:  A male age 46 presents for evaluation of bacteremia, confusion suspected abscess. EXAM: CT CHEST WITH CONTRAST TECHNIQUE: Multidetector CT imaging of the  chest was performed during intravenous contrast administration. RADIATION DOSE REDUCTION: This exam was performed according to the departmental dose-optimization program which includes automated exposure control, adjustment of the mA and/or kV according to patient size and/or use of iterative reconstruction technique. CONTRAST:  132mL OMNIPAQUE IOHEXOL 300 MG/ML  SOLN COMPARISON:  Apr 02, 2021. FINDINGS: Cardiovascular: Extensive 3 vessel coronary artery calcification. Heart size top normal without pericardial effusion or signs of pericardial thickening. Aorta of normal caliber with calcified and noncalcified atheromatous plaque. Central pulmonary vasculature is unremarkable on venous phase. Mediastinum/Nodes: No thoracic inlet, axillary, mediastinal or hilar adenopathy. Esophagus grossly normal. Lungs/Pleura: Basilar atelectasis. Trace pleural fluid. Mild scarring at the RIGHT lung apex. No pneumothorax. Airways are patent. Upper Abdomen: Hepatic steatosis. Upper abdominal assessment limited by beam hardening artifact from hardware along the LEFT flank related to monitoring equipment and due to arm positioning. Suspect hepatic steatosis. No acute findings relative to pancreas, spleen, adrenal glands or kidneys. No acute gastrointestinal process. Musculoskeletal: No acute bone finding. No destructive bone process. Spinal degenerative changes. IMPRESSION: 1. Patchy basilar airspace disease likely atelectasis with signs of scarring in the LEFT upper lobe. No lobar consolidation. 2. Extensive 3 vessel coronary artery calcification. 3. Hepatic steatosis. 4. Aortic atherosclerosis. Aortic Atherosclerosis (ICD10-I70.0). Electronically Signed   By: Zetta Bills M.D.   On: 04/19/2022 08:23     LOS: 20 days   Signature  Lala Lund M.D on 04/20/2022 at 10:07 AM   -  To page go to [email or web address]

## 2022-04-20 NOTE — H&P (View-Only) (Signed)
PROGRESS NOTE        PATIENT DETAILS Name: Christian [name] Age: 71 y.o. Sex: male Date of Birth: 1951/09/24 Admit Date: 03/30/2022 Admitting Physician Rise Patience, MD PCP:Sun, Gari Crown, MD  Brief Summary: Patient is a 71 y.o.  male with a history of A-fib on Xarelto, HFpEF, PAD, DM-2, bipolar disorder-presented with generalized weakness/sacral decubitus ulcer-was found to have hypoglycemia, hyperkalemia and subsequently admitted to the hospitalist service-see below for further details.  Thought to have extrapyramidal symptoms-evaluated by psych/neurology-psych medications adjusted with clinical improvement.  Unfortunately-further hospital course complicated by development of fever-due to probable Streptococcus bacteremia and infected decubitus ulcer.  See below for further details.  Significant events: 5/2>> admit to [facility] for weakness/hypoglycemia/hyperkalemia/rigidity-thought to have EPS 5/14>> febrile/tachycardic-Per nursing staff-discharge from prior existing sacral decubitus ulcer.  Started on broad-spectrum IV antibiotics.  Prelim blood culture positive for Streptococcus species. 5/19>> wound care starting hydrotherapy   Significant studies: 5/3>> x-ray pelvis: No fracture. 5/3>> CT head: No acute intracranial findings. 5/3>> x-ray left shoulder: Rotator cuff impingement 5/14>> CXR:No Obvious PNA 5/15>> left upper extremity Doppler: No DVT. 5/16>> CT abdomen/pelvis: Inferior sacral decubitus ulceration-no evidence of osteomyelitis. 5/16>> Echo: EF 45-50%, no obvious vegetations.  Significant microbiology data: 5/14>> blood culture: Streptococcus constellatus  5/15>> blood culture: No growth  Procedures:   Consults:  Neurology, psychiatry, infectious disease, CCS, Pall care  Subjective: Patient in bed, appears comfortable, denies any headache, no fever, no chest pain or pressure, no shortness of breath , no abdominal pain. No new focal  weakness.   Objective: Vitals: Blood pressure (!) 141/85, pulse 83, temperature (!) 97.2 F (36.2 C), temperature source Oral, resp. rate 12, height 6' (1.829 m), weight 109.8 kg, SpO2 96 %.   Exam:  Awake Alert, No new F.N deficits but chronically bedbound, contracture boots in both legs, severe chronic generalized weakness Foley.AT,PERRAL Supple Neck, No JVD,   Symmetrical Chest wall movement, Good air movement bilaterally, CTAB RRR,No Gallops, Rubs or new Murmurs,  +ve B.Sounds, Abd Soft, No tenderness,   No Cyanosis, Clubbing or edema     Assessment/Plan:  Rigidity/tremors/mildly elevated CK: Felt to be more of extrapyramidal syndrome rather than NMS at this point.  Managed with supportive care.  After neurology/psych evaluation-has been resumed on Abilify and Cogentin-Wellbutrin & Seroquel have been discontinued.  Sepsis due to possible strep bacteremia-sacral decubitus ulcer with infection and underlying sacral abscess: Sepsis physiology improving-continues to have low-grade fever-once afebrile switch from Dapto to Zyvox (end date 5/31) and continue Augmentin until 5/24.  CT abdomen on 5/16 without any sacral osteomyelitis-TTE without vegetation.  Wound care following, had evidence of self opening of her sacral abscess with drainage on 04/17/2022 during wound care session, general surgery consulted, they have seen the patient no surgical intervention at this time per general surgery will continue wound care.  Case discussed with ID repeat CT done on 04/19/2022 is nonacute, TEE requested as well as he is still spiking low-grade fevers - TEE due on 04/21/22.  Per ID  Once fever resolve will switch dapto to linezolid Finish dapto-->linezolid on 5/31 Finish amox-clav [id number]    Acute metabolic encephalopathy: Due to sepsis--has significant amount of debility/deconditioning at baseline.    AKI on CKD stage IIIa: AKI likely hemodynamically mediated-improved-creatinine close to baseline.    Mild rhabdomyolysis: Nontraumatic-improved with supportive care.  Hypokalemia: Replaced  Hypoglycemia:  Resolved with supportive care.  Do not resume Amaryl on discharge.  Hypothyroidism: Continue Cytomel.  PAF: Rate controlled-on Xarelto, currently switched to Lovenox for possible surgery if needed for sacral decubitus abscess.  PAD: Follow-up with vascular surgery in the outpatient setting  HFpEF with EF 45 to 50% on recent echocardiogram: Volume status stable-suspect has some amount of edema in lower extremities due to third spacing.  Demadex held due to sepsis physiology-should be able to resume over the next few days.  Bipolar disorder: Given concerns for EPS-on Abilify and Cogentin-Depakote/Wellbutrin and Seroquel held.  Left shoulder pain: Supportive care-likely rotator cuff injury.  Stable to be followed up by orthopedics in the outpatient setting  Left arm swelling: Likely due to IVF infiltration-could have superficial venous thrombosis-left upper extremity Doppler negative for DVT.  Supportive local care at this point.  Note patient is already on full dose anticoagulation for his A-fib.  OSA: CPAP nightly  DM-2 (A1c 7.6 on 4/15): CBG relatively stable-avoiding tight glycemic control-at risk for hypoglycemia-continue Semglee 10 units and SSI.  Recent Labs    04/19/22 1545 04/19/22 2052 04/20/22 0750  GLUCAP 156* 123* 92     Debility/deconditioning: Has had worsening of his chronic debility during this hospitalization-plans are of SNF on discharge.  Per family-he has been nonambulatory since his discharge from [facility] in April 2023.  This likely resulted in decubitus ulcer that the patient presented with.  Nutrition Status: Nutrition Problem: Increased nutrient needs Etiology: chronic illness, wound healing Signs/Symptoms: estimated needs Interventions: MVI, Hormel Shake  Pressure Ulcer: Pressure Injury 03/31/22 Sacrum Mid Unstageable - Full thickness tissue  loss in which the base of the injury is covered by slough (yellow, tan, gray, green or brown) and/or eschar (tan, brown or black) in the wound bed. (Active)  03/31/22 1933  Location: Sacrum  Location Orientation: Mid  Staging: Unstageable - Full thickness tissue loss in which the base of the injury is covered by slough (yellow, tan, gray, green or brown) and/or eschar (tan, brown or black) in the wound bed.  Wound Description (Comments):   Present on Admission: Yes  Dressing Type Foam - Lift dressing to assess site every shift;Dakin's-soaked gauze;Gauze (Comment);Barrier Film (skin prep);Moist to moist 04/19/22 1515   Obesity: Estimated body mass index is 32.83 kg/m as calculated from the following:   Height as of this encounter: 6' (1.829 m).   Weight as of this encounter: 109.8 kg.   Code status:   Code Status: Full Code   DVT Prophylaxis:    Family Communication: Daughter-[name] [phone] on 5/18/ 23 by previous MD.   Disposition Plan: Status is: Inpatient Remains inpatient appropriate because: Not stable for discharge-resolving sepsis physiology due to streptococcal bacteremia/infected sacral decubitus ulcer-continues to have low-grade fever-on IV daptomycin/Augmentin.  Hydrotherapy to be started on 5/19.   Planned Discharge Destination:Skilled nursing facility   Diet: Diet Order             DIET - DYS 1 Room service appropriate? Yes with Assist; Fluid consistency: Thin  Diet effective now                   Antimicrobial agents: MEDICATIONS: Scheduled Meds:  amoxicillin-clavulanate  1 tablet Oral Q12H   ARIPiprazole  10 mg Oral q morning   benztropine  1 mg Oral BID   Chlorhexidine Gluconate Cloth  6 each Topical Daily   diclofenac Sodium  2 g Topical QID   enoxaparin (LOVENOX) injection  100 mg Subcutaneous Q12H  insulin aspart  0-9 Units Subcutaneous TID WC   insulin glargine-yfgn  10 Units Subcutaneous Daily   liothyronine  10 mcg  Oral Daily   mouth rinse  15 mL Mouth Rinse BID   multivitamin with minerals  1 tablet Oral Daily   nystatin  5 mL Mouth/Throat QID   senna-docusate  2 tablet Oral BID   Continuous Infusions:  DAPTOmycin (CUBICIN)  IV 700 mg (04/19/22 2049)   lactated ringers 50 mL/hr at 04/19/22 0127   PRN Meds:.acetaminophen **OR** acetaminophen, bisacodyl, HYDROcodone-acetaminophen, melatonin   I have personally reviewed following labs and imaging studies  LABORATORY DATA:  Recent Labs  Lab 04/14/22 0117 04/16/22 0234 04/18/22 0223 04/19/22 0210 04/20/22 0245  WBC 10.0 11.4* 9.5 6.5 5.6  HGB 13.0 11.8* 10.5* 10.5* 12.2*  HCT 41.7 36.9* 34.0* 33.3* 38.3*  PLT 251 245 281 293 293  MCV 86.0 83.5 85.6 85.2 84.7  MCH 26.8 26.7 26.4 26.9 27.0  MCHC 31.2 32.0 30.9 31.5 31.9  RDW 17.6* 17.7* 17.6* 17.6* 17.4*  LYMPHSABS  --   --  1.5 1.0 1.4  MONOABS  --   --  0.5 0.4 0.3  EOSABS  --   --  0.1 0.2 0.1  BASOSABS  --   --  0.0 0.0 0.0    Recent Labs  Lab 04/14/22 0148 04/15/22 0157 04/16/22 0234 04/17/22 1121 04/18/22 0223 04/18/22 0918 04/19/22 0210 04/20/22 0245  NA 139 141 142  --  144  --  143 140  K 4.3 5.3* 3.6  --  3.0*  --  3.5 3.6  CL 110 114* 113*  --  118*  --  116* 113*  CO2 22 17* 23  --  22  --  22 21*  GLUCOSE 156* 173* 120*  --  94  --  122* 89  BUN 34* 37* 37*  --  27*  --  23 17  CREATININE 1.21 1.39* 1.28*  --  1.11  --  1.00 0.93  CALCIUM 9.2 8.8* 9.1  --  9.0  --  8.9 8.8*  AST  --   --   --   --  30  --  30 37  ALT  --   --   --   --  24  --  24 28  ALKPHOS  --   --   --   --  55  --  58 62  BILITOT  --   --   --   --  0.5  --  0.6 0.8  ALBUMIN  --   --   --   --  <1.5*  --  1.5* 1.6*  MG 2.1  --   --   --  2.0  --  1.8 1.8  CRP  --   --   --   --  17.9*  --  13.9* 10.1*  PROCALCITON  --   --   --  0.27 0.24  --  0.11 0.12  AMMONIA  --   --   --   --   --  23  --   --   BNP  --   --   --   --  360.3*  --  596.1* 635.0*   RADIOLOGY STUDIES/RESULTS: CT  HEAD W CONTRAST (5MM)  Result Date: 04/19/2022 CLINICAL DATA:  71 year old male with confusion and bacteremia. EXAM: CT HEAD WITH CONTRAST TECHNIQUE: Contiguous axial images were obtained from the base of the skull through the vertex  with intravenous contrast. RADIATION DOSE REDUCTION: This exam was performed according to the departmental dose-optimization program which includes automated exposure control, adjustment of the mA and/or kV according to patient size and/or use of iterative reconstruction technique. CONTRAST:  127mL OMNIPAQUE IOHEXOL 300 MG/ML  SOLN COMPARISON:  Noncontrast head CT 03/31/2022.  Brain MRI 10/07/2020. FINDINGS: Brain: Postcontrast imaging only. Cerebral volume is stable. No midline shift, ventriculomegaly, mass effect, evidence of mass lesion, intracranial hemorrhage or evidence of cortically based acute infarction. Compared to 03/31/2022, No abnormal enhancement identified. Evidence of a small area of medial right occipital pole encephalomalacia, stable from recent CT (series 5, image 42 today). Elsewhere gray-white matter differentiation appears to remain normal. Vascular: Extensive Calcified atherosclerosis at the skull base. The major intracranial vascular structures appear to be enhancing, patent. Skull: No acute osseous abnormality identified. Sinuses/Orbits: Visualized paranasal sinuses and mastoids are stable and well aerated. Other: No acute orbit or scalp soft tissue finding. Calcified scalp vessel atherosclerosis redemonstrated. IMPRESSION: 1. No acute intracranial abnormality on postcontrast CT. Small area of right PCA territory encephalomalacia suspected. 2. Advanced calcified atherosclerosis. Electronically Signed   By: Genevie Ann M.D.   On: 04/19/2022 08:18   CT CHEST W CONTRAST  Result Date: 04/19/2022 CLINICAL DATA:  A male age 47 presents for evaluation of bacteremia, confusion suspected abscess. EXAM: CT CHEST WITH CONTRAST TECHNIQUE: Multidetector CT imaging of the  chest was performed during intravenous contrast administration. RADIATION DOSE REDUCTION: This exam was performed according to the departmental dose-optimization program which includes automated exposure control, adjustment of the mA and/or kV according to patient size and/or use of iterative reconstruction technique. CONTRAST:  143mL OMNIPAQUE IOHEXOL 300 MG/ML  SOLN COMPARISON:  Apr 02, 2021. FINDINGS: Cardiovascular: Extensive 3 vessel coronary artery calcification. Heart size top normal without pericardial effusion or signs of pericardial thickening. Aorta of normal caliber with calcified and noncalcified atheromatous plaque. Central pulmonary vasculature is unremarkable on venous phase. Mediastinum/Nodes: No thoracic inlet, axillary, mediastinal or hilar adenopathy. Esophagus grossly normal. Lungs/Pleura: Basilar atelectasis. Trace pleural fluid. Mild scarring at the RIGHT lung apex. No pneumothorax. Airways are patent. Upper Abdomen: Hepatic steatosis. Upper abdominal assessment limited by beam hardening artifact from hardware along the LEFT flank related to monitoring equipment and due to arm positioning. Suspect hepatic steatosis. No acute findings relative to pancreas, spleen, adrenal glands or kidneys. No acute gastrointestinal process. Musculoskeletal: No acute bone finding. No destructive bone process. Spinal degenerative changes. IMPRESSION: 1. Patchy basilar airspace disease likely atelectasis with signs of scarring in the LEFT upper lobe. No lobar consolidation. 2. Extensive 3 vessel coronary artery calcification. 3. Hepatic steatosis. 4. Aortic atherosclerosis. Aortic Atherosclerosis (ICD10-I70.0). Electronically Signed   By: Zetta Bills M.D.   On: 04/19/2022 08:23     LOS: 20 days   Signature  Lala Lund M.D on 04/20/2022 at 10:07 AM   -  To page go to [email or web address]

## 2022-04-20 NOTE — Progress Notes (Signed)
Speech Language Pathology Treatment: Dysphagia  Patient Details Name: Christian [name] MRN: [id number] DOB: March 15, 1951 Today's Date: 04/20/2022 Time: 3710-6269 SLP Time Calculation (min) (ACUTE ONLY): 20 min  Assessment / Plan / Recommendation Clinical Impression  Patient seen by SLP for skilled treatment session focused on dysphagia goals. Patient was able to verbalize his wants/needs and direct his care during this session and although he appears profoundly weak, he was alert and attentive and not currently appearing with any significant cognitive impairment/AMS. Patient declined all solids on tray and only wanted liquids. He consumed one and a half bottles of Ensure and approximately 8 ounces of water. Swallow initiation was timely and no overt s/s aspiration or penetration with voice sounding clear (continues to be low in intensity. SLP plans to f/u next 1-2 dates to determine if patient is able to and willing to try some advanced solids.     HPI HPI: 71 y.o. male presented 5/2 with worsening strength , new decubitus ulcers and poor intake.  Recent admission for cellulitis and hypoglycemia, had just returned to ALF from SNF a week ago but not OOB since then. CXR shows Low lung volumes with mild atelectasis at the right lung base. No  acute infiltrate is identified.Marland Kitchen      SLP Plan  Continue with current plan of care      Recommendations for follow up therapy are one component of a multi-disciplinary discharge planning process, led by the attending physician.  Recommendations may be updated based on patient status, additional functional criteria and insurance authorization.    Recommendations  Diet recommendations: Dysphagia 1 (puree);Thin liquid Liquids provided via: Straw Medication Administration: Whole meds with liquid Supervision: Full supervision/cueing for compensatory strategies;Trained caregiver to feed patient Compensations: Slow rate;Small sips/bites;Minimize environmental  distractions Postural Changes and/or Swallow Maneuvers: Seated upright 90 degrees;Upright 30-60 min after meal                Oral Care Recommendations: Oral care BID;Staff/trained caregiver to provide oral care Follow Up Recommendations: Skilled nursing-short term rehab (<3 hours/day) Assistance recommended at discharge: Frequent or constant Supervision/Assistance SLP Visit Diagnosis: Dysphagia, unspecified (R13.10) Plan: Continue with current plan of care           Angela Nevin, MA, CCC-SLP Speech Therapy

## 2022-04-20 NOTE — Progress Notes (Signed)
    [facility] has been requested to perform a transesophageal echocardiogram on Christian [name] for bacteremia.  Per chart review, patient has a PMH of bipolar disorder, peripheral arterial disease, diastolic CHF, chronic kidney disease, diabetes mellitus, sleep apnea, atrial fibrillation. Patient presented to the ER on 5/2 complaining of weakness. Was admitted for treatment of hypoglycemia, later found to have strep constellatus bacteremia. Echocardiogram showed aortic valve thickening, ID requested TEE.   After careful review of history and examination, the risks and benefits of transesophageal echocardiogram have been explained including risks of esophageal damage, perforation (1:10,000 risk), bleeding, pharyngeal hematoma as well as other potential complications associated with conscious sedation including aspiration, arrhythmia, respiratory failure and death. Alternatives to treatment were discussed, questions were answered. Patient is unable to consent due to confusion/altered mental status. Discussed procedure, risks, and benefits with daughter/legal guardian Christian [name]. Ms. Christian [name] voiced consent. Witnessed by Cecilie Kicks NP.   Margie Billet, PA-C 04/20/2022 5:08 PM

## 2022-04-20 NOTE — Progress Notes (Signed)
OT Cancellation Note  Patient Details Name: Christian [name] MRN: [id number] DOB: 04/26/1951   Cancelled Treatment:    Reason Eval/Treat Not Completed: Other (comment). OT acknowledges order, will plan on seeing with PT 04/21/22 to maximize skilled contact, facilitate successful movement and education.   Christian [name] 04/20/2022, 8:20 AM  Jesse Sans OTR/L Acute Rehabilitation Services Pager: [phone] Office: [phone]

## 2022-04-21 ENCOUNTER — Encounter (HOSPITAL_COMMUNITY): Payer: Self-pay | Admitting: Internal Medicine

## 2022-04-21 ENCOUNTER — Inpatient Hospital Stay (HOSPITAL_COMMUNITY): Payer: Medicare HMO | Admitting: General Practice

## 2022-04-21 ENCOUNTER — Inpatient Hospital Stay (HOSPITAL_COMMUNITY): Payer: Medicare HMO

## 2022-04-21 ENCOUNTER — Encounter (HOSPITAL_COMMUNITY)
Admission: EM | Disposition: A | Discharge status: Skilled Nursing Facility | Payer: Self-pay | Source: Skilled Nursing Facility | Attending: Internal Medicine

## 2022-04-21 DIAGNOSIS — R7881 Bacteremia: Secondary | ICD-10-CM

## 2022-04-21 DIAGNOSIS — I2510 Atherosclerotic heart disease of native coronary artery without angina pectoris: Secondary | ICD-10-CM | POA: Diagnosis not present

## 2022-04-21 DIAGNOSIS — I739 Peripheral vascular disease, unspecified: Secondary | ICD-10-CM | POA: Diagnosis not present

## 2022-04-21 DIAGNOSIS — I509 Heart failure, unspecified: Secondary | ICD-10-CM | POA: Diagnosis not present

## 2022-04-21 DIAGNOSIS — R627 Adult failure to thrive: Secondary | ICD-10-CM | POA: Diagnosis not present

## 2022-04-21 DIAGNOSIS — A419 Sepsis, unspecified organism: Secondary | ICD-10-CM | POA: Diagnosis not present

## 2022-04-21 DIAGNOSIS — I110 Hypertensive heart disease with heart failure: Secondary | ICD-10-CM

## 2022-04-21 DIAGNOSIS — L89152 Pressure ulcer of sacral region, stage 2: Secondary | ICD-10-CM | POA: Diagnosis not present

## 2022-04-21 DIAGNOSIS — R6520 Severe sepsis without septic shock: Secondary | ICD-10-CM | POA: Diagnosis not present

## 2022-04-21 HISTORY — PX: TEE WITHOUT CARDIOVERSION: SHX5443

## 2022-04-21 LAB — COMPREHENSIVE METABOLIC PANEL
ALT: 44 U/L (ref 0–44)
AST: 71 U/L — ABNORMAL HIGH (ref 15–41)
Albumin: <1.5 g/dL — ABNORMAL LOW (ref 3.5–5.0)
Alkaline Phosphatase: 54 U/L (ref 38–126)
Anion gap: 3 — ABNORMAL LOW (ref 5–15)
BUN: 16 mg/dL (ref 8–23)
CO2: 23 mmol/L (ref 22–32)
Calcium: 8.5 mg/dL — ABNORMAL LOW (ref 8.9–10.3)
Chloride: 110 mmol/L (ref 98–111)
Creatinine, Ser: 0.85 mg/dL (ref 0.61–1.24)
GFR, Estimated: >60 mL/min (ref >60)
Glucose, Bld: 150 mg/dL — ABNORMAL HIGH (ref 70–99)
Potassium: 4.1 mmol/L (ref 3.5–5.1)
Sodium: 136 mmol/L (ref 135–145)
Total Bilirubin: 0.4 mg/dL (ref 0.3–1.2)
Total Protein: 5.4 g/dL — ABNORMAL LOW (ref 6.5–8.1)

## 2022-04-21 LAB — CBC WITH DIFFERENTIAL/PLATELET
Abs Immature Granulocytes: 0.04 10*3/uL (ref 0.00–0.07)
Basophils Absolute: 0 10*3/uL (ref 0.0–0.1)
Basophils Relative: 0 %
Eosinophils Absolute: 0.1 10*3/uL (ref 0.0–0.5)
Eosinophils Relative: 2 %
HCT: 32.9 % — ABNORMAL LOW (ref 39.0–52.0)
Hemoglobin: 10.2 g/dL — ABNORMAL LOW (ref 13.0–17.0)
Immature Granulocytes: 1 %
Lymphocytes Relative: 24 %
Lymphs Abs: 1.3 10*3/uL (ref 0.7–4.0)
MCH: 26.6 pg (ref 26.0–34.0)
MCHC: 31 g/dL (ref 30.0–36.0)
MCV: 85.9 fL (ref 80.0–100.0)
Monocytes Absolute: 0.4 10*3/uL (ref 0.1–1.0)
Monocytes Relative: 7 %
Neutro Abs: 3.5 10*3/uL (ref 1.7–7.7)
Neutrophils Relative %: 66 %
Platelets: 293 10*3/uL (ref 150–400)
RBC: 3.83 MIL/uL — ABNORMAL LOW (ref 4.22–5.81)
RDW: 17.2 % — ABNORMAL HIGH (ref 11.5–15.5)
WBC: 5.4 10*3/uL (ref 4.0–10.5)
nRBC: 0 % (ref 0.0–0.2)

## 2022-04-21 LAB — GLUCOSE, CAPILLARY
Glucose-Capillary: 96 mg/dL (ref 70–99)
Glucose-Capillary: 84 mg/dL (ref 70–99)
Glucose-Capillary: 159 mg/dL — ABNORMAL HIGH (ref 70–99)
Glucose-Capillary: 137 mg/dL — ABNORMAL HIGH (ref 70–99)

## 2022-04-21 LAB — MAGNESIUM: Magnesium: 1.8 mg/dL (ref 1.7–2.4)

## 2022-04-21 LAB — C-REACTIVE PROTEIN: CRP: 6.6 mg/dL — ABNORMAL HIGH (ref <1.0)

## 2022-04-21 LAB — BRAIN NATRIURETIC PEPTIDE: B Natriuretic Peptide: 361.2 pg/mL — ABNORMAL HIGH (ref 0.0–100.0)

## 2022-04-21 LAB — PROCALCITONIN: Procalcitonin: 0.11 ng/mL

## 2022-04-21 SURGERY — ECHOCARDIOGRAM, TRANSESOPHAGEAL
Anesthesia: General

## 2022-04-21 MED ORDER — PROPOFOL 10 MG/ML IV BOLUS
INTRAVENOUS | Status: DC | PRN
  Administered 2022-04-21: 30 mg via INTRAVENOUS

## 2022-04-21 MED ORDER — PROPOFOL 500 MG/50ML IV EMUL
INTRAVENOUS | Status: DC | PRN
  Administered 2022-04-21: 150 ug/kg/min via INTRAVENOUS

## 2022-04-21 MED ORDER — LIDOCAINE 2% (20 MG/ML) 5 ML SYRINGE
INTRAMUSCULAR | Status: DC | PRN
  Administered 2022-04-21: 100 mg via INTRAVENOUS

## 2022-04-21 MED ORDER — PHENYLEPHRINE 80 MCG/ML (10ML) SYRINGE FOR IV PUSH (FOR BLOOD PRESSURE SUPPORT)
PREFILLED_SYRINGE | INTRAVENOUS | Status: AC
  Filled 2022-04-21: qty 10

## 2022-04-21 MED ORDER — PHENYLEPHRINE 80 MCG/ML (10ML) SYRINGE FOR IV PUSH (FOR BLOOD PRESSURE SUPPORT)
PREFILLED_SYRINGE | INTRAVENOUS | Status: DC | PRN
Start: 2022-04-21 — End: 2022-04-21
  Administered 2022-04-21: 80 ug via INTRAVENOUS

## 2022-04-21 MED ORDER — COLLAGENASE 250 UNIT/GM EX OINT
TOPICAL_OINTMENT | Freq: Every day | CUTANEOUS | Status: DC
  Filled 2022-04-21: qty 30

## 2022-04-21 NOTE — Progress Notes (Signed)
  Echocardiogram Echocardiogram Transesophageal has been performed.  Christian [name] 04/21/2022, 11:45 AM

## 2022-04-21 NOTE — Transfer of Care (Signed)
Immediate Anesthesia Transfer of Care Note  Patient: Christian [name]  Procedure(s) Performed: TRANSESOPHAGEAL ECHOCARDIOGRAM (TEE)  Patient Location: Endoscopy Unit  Anesthesia Type:MAC  Level of Consciousness: awake and drowsy  Airway & Oxygen Therapy: Patient Spontanous Breathing and Patient connected to nasal cannula oxygen  Post-op Assessment: Report given to RN and Post -op Vital signs reviewed and stable  Post vital signs: Reviewed and stable  Last Vitals:  Vitals Value Taken Time  BP 127/69 04/21/22 1137  Temp    Pulse 76 04/21/22 1139  Resp 37 04/21/22 1139  SpO2 92 % 04/21/22 1139  Vitals shown include unvalidated device data.  Last Pain:  Vitals:   04/21/22 1008  TempSrc:   PainSc: 0-No pain      Patients Stated Pain Goal: 0 (39/53/20 2334)  Complications: No notable events documented.

## 2022-04-21 NOTE — Progress Notes (Signed)
Progress Note  Day of Surgery  Subjective: Pt seen with PT hydrotherapy. Wound cleaning up.   Objective: Vital signs in last 24 hours: Temp:  [97.7 F (36.5 C)-99.1 F (37.3 C)] 98.4 F (36.9 C) (05/24 1248) Pulse Rate:  [76-99] 82 (05/24 1248) Resp:  [15-24] 20 (05/24 1248) BP: (121-153)/(69-91) 143/83 (05/24 1248) SpO2:  [90 %-99 %] 97 % (05/24 1248) Last BM Date : 04/20/22  Intake/Output from previous day: 05/23 0701 - 05/24 0700 In: 240 [P.O.:240] Out: 1650 [Urine:1650] Intake/Output this shift: Total I/O In: 200 [I.V.:200] Out: -   PE: Some necrotic appearing tissue still in wound base but appears improved still from previous photos, tract extending 8 cm from wound inferiorly. Some bloody drainage.       Lab Results:  Recent Labs    04/20/22 0245 04/21/22 0114  WBC 5.6 5.4  HGB 12.2* 10.2*  HCT 38.3* 32.9*  PLT 293 293    BMET Recent Labs    04/20/22 0245 04/21/22 0114  NA 140 136  K 3.6 4.1  CL 113* 110  CO2 21* 23  GLUCOSE 89 150*  BUN 17 16  CREATININE 0.93 0.85  CALCIUM 8.8* 8.5*    PT/INR No results for input(s): LABPROT, INR in the last 72 hours. CMP     Component Value Date/Time   NA 136 04/21/2022 0114   NA 139 08/10/2014 1327   K 4.1 04/21/2022 0114   K 4.3 08/10/2014 1327   CL 110 04/21/2022 0114   CL 105 08/10/2014 1327   CO2 23 04/21/2022 0114   CO2 27 08/10/2014 1327   GLUCOSE 150 (H) 04/21/2022 0114   GLUCOSE 109 (H) 08/10/2014 1327   BUN 16 04/21/2022 0114   BUN 13 08/10/2014 1327   CREATININE 0.85 04/21/2022 0114   CREATININE 1.25 08/10/2014 1327   CALCIUM 8.5 (L) 04/21/2022 0114   CALCIUM 8.8 08/10/2014 1327   PROT 5.4 (L) 04/21/2022 0114   PROT 7.0 08/10/2014 1327   ALBUMIN <1.5 (L) 04/21/2022 0114   ALBUMIN 3.3 (L) 08/10/2014 1327   AST 71 (H) 04/21/2022 0114   AST 96 (H) 08/10/2014 1327   ALT 44 04/21/2022 0114   ALT 187 (H) 08/10/2014 1327   ALKPHOS 54 04/21/2022 0114   ALKPHOS 81 08/10/2014 1327    BILITOT 0.4 04/21/2022 0114   BILITOT 0.3 08/10/2014 1327   GFRNONAA >60 04/21/2022 0114   GFRNONAA >60 08/10/2014 1327   GFRAA 52 (L) 04/01/2020 0155   GFRAA >60 08/10/2014 1327   Lipase  No results found for: LIPASE     Studies/Results: No results found.  Anti-infectives: Anti-infectives (From admission, onward)    Start     Dose/Rate Route Frequency Ordered Stop   04/15/22 0900  vancomycin (VANCOREADY) IVPB 1500 mg/300 mL  Status:  Discontinued        1,500 mg 150 mL/hr over 120 Minutes Intravenous Every 24 hours 04/14/22 0811 04/14/22 1005   04/14/22 2200  amoxicillin-clavulanate (AUGMENTIN) 875-125 MG per tablet 1 tablet        1 tablet Oral Every 12 hours 04/14/22 1331     04/14/22 1100  DAPTOmycin (CUBICIN) 700 mg in sodium chloride 0.9 % IVPB        8 mg/kg  90.4 kg (Adjusted) 128 mL/hr over 30 Minutes Intravenous Daily 04/14/22 1005     04/14/22 0900  vancomycin (VANCOREADY) IVPB 2000 mg/400 mL  Status:  Discontinued        2,000 mg 200 mL/hr  over 120 Minutes Intravenous  Once 04/14/22 0811 04/14/22 1005   04/13/22 2200  metroNIDAZOLE (FLAGYL) tablet 500 mg  Status:  Discontinued        500 mg Oral Every 12 hours 04/13/22 1436 04/14/22 1331   04/12/22 0815  cefTRIAXone (ROCEPHIN) 2 g in sodium chloride 0.9 % 100 mL IVPB  Status:  Discontinued        2 g 200 mL/hr over 30 Minutes Intravenous Daily 04/12/22 0717 04/14/22 0811   04/12/22 0600  vancomycin (VANCOREADY) IVPB 1250 mg/250 mL  Status:  Discontinued        1,250 mg 166.7 mL/hr over 90 Minutes Intravenous Every 24 hours 04/11/22 0521 04/12/22 0717   04/11/22 1145  metroNIDAZOLE (FLAGYL) IVPB 500 mg  Status:  Discontinued        500 mg 100 mL/hr over 60 Minutes Intravenous Every 12 hours 04/11/22 1055 04/13/22 1436   04/11/22 0600  ceFEPIme (MAXIPIME) 2 g in sodium chloride 0.9 % 100 mL IVPB  Status:  Discontinued        2 g 200 mL/hr over 30 Minutes Intravenous Every 8 hours 04/11/22 0437 04/12/22 0717    04/11/22 0530  vancomycin (VANCOREADY) IVPB 2000 mg/400 mL        2,000 mg 200 mL/hr over 120 Minutes Intravenous  Once 04/11/22 0437 04/11/22 2008        Assessment/Plan Sacral decubitus ulcer, unstageable - getting hydrotherapy M/W/F - continue while admitted but ok to stop when otherwise ready for DC - BID wet to dry dressings, ok to use santyl in wound base - would not recommend surgical debridement at this point in time, as wound is cleaning up with current treatment - general surgery will sign off. Patient could follow up with WOC inpatient and in wound care clinic as an outpatient  LOS: 21 days     Juliet Rude, [facility] 04/21/2022, 1:34 PM Please see Amion for pager number during day hours 7:00am-4:30pm

## 2022-04-21 NOTE — Progress Notes (Signed)
ANTICOAGULATION CONSULT NOTE - Follow Up Consult  Pharmacy Consult for Lovenox Indication: atrial fibrillation  Allergies  Allergen Reactions   Amlodipine Swelling and Other (See Comments)    Legs became swollen   Onglyza [Saxagliptin] Other (See Comments)    "Allergic," per [facility]   Zyprexa [Olanzapine] Other (See Comments)    Allergy noted by ACT Team. No reaction specified (??)- "Allergic," per [facility]   Ritalin [Methylphenidate] Anxiety and Other (See Comments)    "Maniac"    Labs: Recent Labs    04/18/22 1442 04/19/22 0210 04/19/22 0210 04/20/22 0245 04/21/22 0114  HGB  --  10.5*   < > 12.2* 10.2*  HCT  --  33.3*  --  38.3* 32.9*  PLT  --  293  --  293 293  APTT 51*  --   --   --   --   HEPARINUNFRC 0.75*  --   --   --   --   CREATININE  --  1.00  --  0.93 0.85  CKTOTAL  --  29*  --   --   --    < > = values in this interval not displayed.    Estimated Creatinine Clearance: 106.5 mL/min (by C-G formula based on SCr of 0.85 mg/dL).  Assessment: Patient was admitted 03/30/2022 for weakness and sacral decubitus ulcers. PTA rivaroxaban for atrial fibrillation is on hold. Continues on sq Lovenox for possible surgeries for sacral wound  CBC and Scr WNL  Goal of Therapy:  Anti-Xa level 0.6-1 units/ml 4hrs after LMWH dose given Monitor platelets by anticoagulation protocol: Yes   Plan:  Continue Lovenox 100 mg sq Q 12 hours Continue to follow  Thank you Okey Regal, PharmD   04/21/2022,8:43 AM

## 2022-04-21 NOTE — Progress Notes (Signed)
Physical Therapy Wound Treatment Patient Details  Name: Christian [name] MRN: [id number] Date of Birth: [birth year]  Today's Date: 04/21/2022 Time: 1340-1430 Time Calculation (min): 50 min  Subjective  Subjective Assessment Subjective: Pt agreeable to hydrotherapy, reporting his head would slide towards the bedrail as the session progressed. Patient and Family Stated Goals: None stated Date of Onset:  (Unknown) Prior Treatments: Dressing changes  Pain Score:  Premedicated, but pt reporting 6/10 pain with regular moaning during session  Wound Assessment  Pressure Injury 03/31/22 Sacrum Mid Unstageable - Full thickness tissue loss in which the base of the injury is covered by slough (yellow, tan, gray, green or brown) and/or eschar (tan, brown or black) in the wound bed. (Active)  Wound Image   04/21/22 1453  Dressing Type Foam - Lift dressing to assess site every shift;Gauze (Comment);Barrier Film (skin prep);Moist to moist;Normal saline moist dressing 04/21/22 1453  Dressing Clean, Dry, Intact;Changed 04/21/22 1453  Dressing Change Frequency Twice a day 04/21/22 1453  State of Healing Eschar 04/21/22 1453  Site / Wound Assessment Painful;Yellow;Pink;Red;Black;Brown 04/21/22 1453  % Wound base Red or Granulating 60% 04/21/22 1453  % Wound base Yellow/Fibrinous Exudate 25% 04/21/22 1453  % Wound base Black/Eschar 15% 04/21/22 1453  % Wound base Other/Granulation Tissue (Comment) 0% 04/21/22 1453  Peri-wound Assessment Purple;Pink 04/21/22 1453  Wound Length (cm) 10 cm 04/17/22 1645  Wound Width (cm) 10 cm 04/17/22 1645  Wound Depth (cm) 0.5 cm 04/17/22 1645  Wound Surface Area (cm^2) 100 cm^2 04/17/22 1645  Wound Volume (cm^3) 50 cm^3 04/17/22 1645  Tunneling (cm) 0 04/17/22 1645  Undermining (cm) 0 04/17/22 1645  Margins Unattached edges (unapproximated) 04/21/22 1453  Drainage Amount Moderate 04/21/22 1453  Drainage Description Serosanguineous 04/21/22 1453  Treatment Debridement  (Selective);Hydrotherapy (Pulse lavage);Packing (Saline gauze) 04/21/22 1453      Hydrotherapy Pulsed lavage therapy - wound location: Sacrum Pulsed Lavage with Suction (psi): 4 psi (started with 8-12 psi but pt reported pain, thus reduced to 4) Pulsed Lavage with Suction - Normal Saline Used: 1000 mL Pulsed Lavage Tip: Tip with splash shield Selective Debridement Selective Debridement - Location: Sacrum Selective Debridement - Tools Used: Forceps, Scalpel, Scissors Selective Debridement - Tissue Removed: Eschar and necrotic tissue    Wound Assessment and Plan  Wound Therapy - Assess/Plan/Recommendations Wound Therapy - Clinical Statement: There no longer appears to be purulent drainage from the tunnel. Assessed wound with Claiborne Billings, PA during session, who reported we can transition pt to santyl next session. The wound bed appears to be gradually improving with reduction in black eschar tissue. Despite being premedicated, pt still reported 6/10 pain and moaned regularly during the session, only tolerating pulsed lavage at a level of 4 psi today. This patient will benefit from continued hydrotherapy for selective removal of unviable tissue, to decrease bioburden, and promote wound bed healing. Wound Therapy - Functional Problem List: Global weakness; apparently had not been out of bed since he got back to ALF from SNF ~1 week (PTA). Factors Delaying/Impairing Wound Healing: Diabetes Mellitus, Incontinence, Infection - systemic/local, Immobility, Multiple medical problems Hydrotherapy Plan: Debridement, Dressing change, Patient/family education, Pulsatile lavage with suction Wound Therapy - Frequency: 3X / week Wound Therapy - Follow Up Recommendations: dressing changes by RN  Wound Therapy Goals- Improve the function of patient's integumentary system by progressing the wound(s) through the phases of wound healing (inflammation - proliferation - remodeling) by: Wound Therapy Goals - Improve the  function of patient's integumentary system by progressing the wound(s)  through the phases of wound healing by: Decrease Necrotic Tissue to: 20% Decrease Necrotic Tissue - Progress: Progressing toward goal Increase Granulation Tissue to: 80% Increase Granulation Tissue - Progress: Progressing toward goal Improve Drainage Characteristics: Min, Serous Improve Drainage Characteristics - Progress: Progressing toward goal Goals/treatment plan/discharge plan were made with and agreed upon by patient/family: Yes Time For Goal Achievement: 7 days Wound Therapy - Potential for Goals: Fair  Goals will be updated until maximal potential achieved or discharge criteria met.  Discharge criteria: when goals achieved, discharge from hospital, MD decision/surgical intervention, no progress towards goals, refusal/missing three consecutive treatments without notification or medical reason.  GP     Charges PT Wound Care Charges $Wound Debridement up to 20 cm: < or equal to 20 cm $ Wound Debridement each add'l 20 sqcm: 1 $PT PLS Gun and Tip: 1 Supply $PT Hydrotherapy Visit: 1 Visit       Moishe Spice, PT, DPT Acute Rehabilitation Services  Pager: [phone] Office: [place] 04/21/2022, 3:06 PM

## 2022-04-21 NOTE — Progress Notes (Addendum)
Daughter called and concerned about pt returning to [facility]. She states she wants to make sure that they will be able to feed him and do total care until he is able to do it for himself. Also that PT will be doing there what he is doing here. Informed daughter to call the SW in the morning and ask them these questions and if there is another place he could go to. Explained all facilities including hospitals sometimes are short staffed and it takes a little longer for pts to get fed and taken care of.  Daughter also concerned that the staff here aren't keeping her and/or her daughter updated on the progress of the pt and what is going on including test results, POC, and DC plans.

## 2022-04-21 NOTE — Anesthesia Preprocedure Evaluation (Addendum)
Anesthesia Evaluation  Patient identified by MRN, date of birth, ID band Patient awake    Reviewed: Allergy & Precautions, NPO status , Patient's Chart, lab work & pertinent test results  Airway Mallampati: Unable to assess   Neck ROM: Full  Mouth opening: Limited Mouth Opening Comment: Pt unable to cooperate with airway exam  Dental  (+) Poor Dentition, Dental Advisory Given, Edentulous Upper, Chipped   Pulmonary sleep apnea and Continuous Positive Airway Pressure Ventilation ,    breath sounds clear to auscultation       Cardiovascular hypertension, Pt. on medications + CAD, + Cardiac Stents (2 stents about 2 years ago, no issues since then per family) and +CHF  + dysrhythmias Atrial Fibrillation  Rhythm:Irregular Rate:Normal  04/13/2022 ECHO: EF 45 to 50%. The LV has mildly decreased function, mild hypokinesis of the left ventricular, apical anterior wall, inferoseptal wall and anteroseptal wall. There is mild dyskinesis of the left ventricular, entire apical segment. 2. Left atrial size was mildly dilated. 3. No evidence of MR. Mild MS. The mean mitral valve gradient is 4.7 mmHg with average heart rate of 104 bpm. Moderate to severe mitral annular calcification. 4. The aortic valve is tricuspid. There is moderate calcification of the aortic valve. There is moderate thickening of the aortic valve. Aortic valve regurgitation is not visualized. Aortic valve sclerosis/calcification is present, without any evidence of aortic stenosis.   Neuro/Psych PSYCHIATRIC DISORDERS Depression Bipolar Disorder CVA, Residual Symptoms    GI/Hepatic negative GI ROS, Neg liver ROS,   Endo/Other  negative endocrine ROSdiabetes, Oral Hypoglycemic Agents, Insulin Dependent  Renal/GU Renal InsufficiencyRenal diseasenegative Renal ROS  negative genitourinary   Musculoskeletal negative musculoskeletal ROS (+)   Abdominal (+) + obese,   Peds negative  pediatric ROS (+)  Hematology xarelto   Anesthesia Other Findings   Reproductive/Obstetrics negative OB ROS                            Anesthesia Physical Anesthesia Plan  ASA: 4  Anesthesia Plan: MAC   Post-op Pain Management: Minimal or no pain anticipated   Induction:   PONV Risk Score and Plan: 1 and Treatment may vary due to age or medical condition, TIVA and Propofol infusion  Airway Management Planned: Natural Airway and Nasal Cannula  Additional Equipment: None  Intra-op Plan:   Post-operative Plan:   Informed Consent: I have reviewed the patients History and Physical, chart, labs and discussed the procedure including the risks, benefits and alternatives for the proposed anesthesia with the patient or authorized representative who has indicated his/her understanding and acceptance.     Dental advisory given and Consent reviewed with POA  Plan Discussed with: CRNA  Anesthesia Plan Comments: (Spoke with daughter and wife of patient- no history of issues w/ anesthesia. Pt confused, not cooperating with airway exam. Sepsis due to possible strep bacteremia-sacral decubitus ulcer with infection and underlying sacral abscess)      Anesthesia Quick Evaluation

## 2022-04-21 NOTE — Evaluation (Signed)
Occupational Therapy Evaluation Patient Details Name: Christian [name] MRN: [id number] DOB: 01-04-1951 Today's Date: 04/21/2022   History of Present Illness 71 y.o. male presented 5/2 with worsening strength , new decubitus ulcers and poor intake.  Recent admission for cellulitis and hypoglycemia, had just returned to ALF from SNF a week ago but not OOB since then.  Has new hypoglyemia, elevation of K+, creatinine, CK.  Ulcer is not infected but has LE skin is also irritated and has skin breakdown. LEFt UE swollen and Doppler ordered 5/15 to r/o DVT.  PMHx:  chronic venous stasis and chronic right shin unhealing wound, chronic HFpEF, IDDM, CAD with stenting x2 in 2021, PVD, CKD stage IIIa, HTN, DVT on Xarelto, bipolar disorder, OSA on CPAP at bedtime   Clinical Impression   Pt was ambulating in therapy at SNF, but has not since his return to ALF. ALF assisted with ADLs. Pt presents with R LE pain, significant generalized weakness, B UE edema and poor sitting balance. He is dependent in all ADLs and requires 2 person max to total assist for bed level mobility. Pt will need SNF level rehab upon discharge.      Recommendations for follow up therapy are one component of a multi-disciplinary discharge planning process, led by the attending physician.  Recommendations may be updated based on patient status, additional functional criteria and insurance authorization.   Follow Up Recommendations  Skilled nursing-short term rehab (<3 hours/day)    Assistance Recommended at Discharge Frequent or constant Supervision/Assistance  Patient can return home with the following Two people to help with walking and/or transfers;Two people to help with bathing/dressing/bathroom;Assistance with feeding;Assistance with cooking/housework;Direct supervision/assist for medications management;Direct supervision/assist for financial management;Assist for transportation;Help with stairs or ramp for entrance    Functional  Status Assessment  Patient has had a recent decline in their functional status and/or demonstrates limited ability to make significant improvements in function in a reasonable and predictable amount of time  Equipment Recommendations  Other (comment) (defer to next venue)    Recommendations for Other Services       Precautions / Restrictions Precautions Precautions: Fall Precaution Comments: monitor vitals, HR and BP esp Restrictions Weight Bearing Restrictions: No      Mobility Bed Mobility Overal bed mobility: Needs Assistance Bed Mobility: Rolling, Supine to Sit, Sit to Sidelying Rolling: +2 for physical assistance, Total assist   Supine to sit: Max assist, +2 for physical assistance, HOB elevated   Sit to sidelying: Total assist, +2 for physical assistance General bed mobility comments: rolling rt and left with pt able to hold himself on his side with use of rail; allowed lower body to return to supine while holding shoulders in sidelying to facilitate trunk rotation; pt moving legs towards EOB and assisting with raising torso    Transfers                   General transfer comment: unable      Balance Overall balance assessment: Needs assistance, History of Falls Sitting-balance support: Feet supported, Bilateral upper extremity supported Sitting balance-Leahy Scale: Poor Sitting balance - Comments: Rt lateral lean requiring min-modA to maintain midline posture (but able to progress to minguard assist for up to 15 seconds at a time). EOB ~12 minutes                                   ADL either performed or assessed  with clinical judgement   ADL                                         General ADL Comments: pt currently requiring total assist     Vision Ability to See in Adequate Light: 0 Adequate Patient Visual Report: No change from baseline       Perception     Praxis      Pertinent Vitals/Pain Pain  Assessment Pain Assessment: Faces Faces Pain Scale: Hurts even more Pain Location: sacrum, Rt knee Pain Descriptors / Indicators: Grimacing, Guarding Pain Intervention(s): Monitored during session, Repositioned     Hand Dominance Right   Extremity/Trunk Assessment Upper Extremity Assessment Upper Extremity Assessment: Generalized weakness (edematous) RUE Deficits / Details: h/o RTC tear and shoulder limitations   Lower Extremity Assessment Lower Extremity Assessment: Defer to PT evaluation   Cervical / Trunk Assessment Cervical / Trunk Assessment: Other exceptions Cervical / Trunk Exceptions: weaknss, obesity   Communication Communication Communication: Expressive difficulties (low volume)   Cognition Arousal/Alertness: Awake/alert Behavior During Therapy: Flat affect Overall Cognitive Status: No family/caregiver present to determine baseline cognitive functioning                                 General Comments: Follows 1 step commands with incr time. Slow processing     General Comments       Exercises     Shoulder Instructions      Home Living Family/patient expects to be discharged to:: Skilled nursing facility   Available Help at Discharge: Available PRN/intermittently;Personal care attendant Type of Home: Assisted living Home Access: Level entry     Home Layout: One level     Bathroom Shower/Tub: Occupational psychologist: Handicapped height Bathroom Accessibility: Yes   Home Equipment: Rollator (4 wheels);Shower seat;Grab bars - toilet;Grab bars - tub/shower;Wheelchair - Education administrator (comment) (lift chair)          Prior Functioning/Environment Prior Level of Function : Needs assist             Mobility Comments: rolling walker with help at SNF but then no gait in ALF ADLs Comments: ALF assisting with ADLs        OT Problem List: Decreased strength;Decreased range of motion;Decreased activity tolerance;Impaired  balance (sitting and/or standing);Decreased coordination;Decreased cognition;Obesity;Pain;Impaired UE functional use;Decreased knowledge of use of DME or AE      OT Treatment/Interventions: Self-care/ADL training;Therapeutic exercise;DME and/or AE instruction;Therapeutic activities;Patient/family education;Balance training;Cognitive remediation/compensation    OT Goals(Current goals can be found in the care plan section) Acute Rehab OT Goals OT Goal Formulation: With patient Time For Goal Achievement: 05/05/22 Potential to Achieve Goals: Fair ADL Goals Pt Will Perform Eating: with min assist;sitting;bed level Pt Will Perform Grooming: with min assist;sitting;bed level Pt/caregiver will Perform Home Exercise Program: Increased ROM;Increased strength;Both right and left upper extremity;With minimal assist (AAROM) Additional ADL Goal #1: Pt will roll with moderate assistance for pericare and positioning. Additional ADL Goal #2: Pt will demonstrate fair sitting balance x 10 minutes at a precursor to ADLs.  OT Frequency: Min 2X/week    Co-evaluation PT/OT/SLP Co-Evaluation/Treatment: Yes Reason for Co-Treatment: For patient/therapist safety;Complexity of the patient's impairments (multi-system involvement)   OT goals addressed during session: ADL's and self-care      AM-PAC OT "6 Clicks" Daily Activity  Outcome Measure Help from another person eating meals?: Total Help from another person taking care of personal grooming?: Total Help from another person toileting, which includes using toliet, bedpan, or urinal?: Total Help from another person bathing (including washing, rinsing, drying)?: Total Help from another person to put on and taking off regular upper body clothing?: Total Help from another person to put on and taking off regular lower body clothing?: Total 6 Click Score: 6   End of Session    Activity Tolerance: Patient tolerated treatment well Patient left: in bed;with call  bell/phone within reach;with bed alarm set  OT Visit Diagnosis: Muscle weakness (generalized) (M62.81);Pain                Time: [id number] OT Time Calculation (min): 31 min Charges:  OT General Charges $OT Visit: 1 Visit OT Evaluation $OT Eval Moderate Complexity: [place], OTR/L Acute Rehabilitation Services Pager: [phone] Office: [phone]  Malka So 04/21/2022, 12:48 PM

## 2022-04-21 NOTE — Progress Notes (Signed)
Physical Therapy Treatment Patient Details Name: Christian [name] MRN: [id number] DOB: 1951/09/09 Today's Date: 04/21/2022   History of Present Illness 71 y.o. male presented 5/2 with worsening strength , new decubitus ulcers and poor intake.  Recent admission for cellulitis and hypoglycemia, had just returned to ALF from SNF a week ago but not OOB since then.  Has new hypoglyemia, elevation of K+, creatinine, CK.  Ulcer is not infected but has LE skin is also irritated and has skin breakdown. LEFt UE swollen and Doppler ordered 5/15 to r/o DVT.  PMHx:  chronic venous stasis and chronic right shin unhealing wound, chronic HFpEF, IDDM, CAD with stenting x2 in 2021, PVD, CKD stage IIIa, HTN, DVT on Xarelto, bipolar disorder, OSA on CPAP at bedtime    PT Comments    Co-treatment with OT and able to progress to sitting EOB after ROM (trunk and LEs) in supine prior to sitting up. Required +2 max assist, however pt participating and wants to move. He is limited by stiffness and (at times) sacral pain. Returned to sidelying (unsafe to attempt transfers at this time--will need to use lift).    Recommendations for follow up therapy are one component of a multi-disciplinary discharge planning process, led by the attending physician.  Recommendations may be updated based on patient status, additional functional criteria and insurance authorization.  Follow Up Recommendations  Skilled nursing-short term rehab (<3 hours/day)     Assistance Recommended at Discharge Frequent or constant Supervision/Assistance  Patient can return home with the following Two people to help with walking and/or transfers;Assistance with cooking/housework;Assist for transportation;Help with stairs or ramp for entrance;Two people to help with bathing/dressing/bathroom;Assistance with feeding;Direct supervision/assist for medications management;Direct supervision/assist for financial management   Equipment Recommendations  Other  (comment) (hoyer lift)    Recommendations for Other Services       Precautions / Restrictions Precautions Precautions: Fall Precaution Comments: monitor vitals, HR and BP esp Restrictions Weight Bearing Restrictions: No     Mobility  Bed Mobility Overal bed mobility: Needs Assistance Bed Mobility: Rolling, Supine to Sit, Sit to Sidelying Rolling: +2 for physical assistance, Total assist   Supine to sit: Max assist, +2 for physical assistance, HOB elevated   Sit to sidelying: Total assist, +2 for physical assistance General bed mobility comments: rolling rt and left with pt able to hold himself on his side with use of rail; allowed lower body to return to supine while holding shoulders in sidelying to facilitate trunk rotation; pt moving legs towards EOB and assisting with raising torso    Transfers                   General transfer comment: unable    Ambulation/Gait               General Gait Details: unable   Stairs             Wheelchair Mobility    Modified Rankin (Stroke Patients Only)       Balance Overall balance assessment: Needs assistance, History of Falls Sitting-balance support: Feet supported, Bilateral upper extremity supported Sitting balance-Leahy Scale: Poor Sitting balance - Comments: Rt lateral lean requiring min-modA to maintain midline posture (but able to progress to minguard assist for up to 15 seconds at a time). EOB ~12 minutes Postural control: Right lateral lean  Cognition Arousal/Alertness: Awake/alert Behavior During Therapy: Flat affect Overall Cognitive Status: No family/caregiver present to determine baseline cognitive functioning                                 General Comments: Follows 1 step commands with incr time. Slow processing        Exercises General Exercises - Lower Extremity Ankle Circles/Pumps: Both, 5 reps, AROM (more assist on  PF than DF) Long Arc Quad: AROM, Both, Seated, 10 reps Heel Slides: Both, Supine, AAROM, Other reps (comment) (3)    General Comments        Pertinent Vitals/Pain Pain Assessment Pain Assessment: Faces Faces Pain Scale: Hurts even more Pain Location: sacrum, Rt knee Pain Descriptors / Indicators: Grimacing, Guarding Pain Intervention(s): Limited activity within patient's tolerance, Monitored during session, Repositioned    Home Living                          Prior Function            PT Goals (current goals can now be found in the care plan section) Acute Rehab PT Goals Patient Stated Goal: to not fall Time For Goal Achievement: 04/26/22 Potential to Achieve Goals: Fair Progress towards PT goals: Progressing toward goals    Frequency    Min 2X/week      PT Plan Current plan remains appropriate    Co-evaluation PT/OT/SLP Co-Evaluation/Treatment: Yes Reason for Co-Treatment: Complexity of the patient's impairments (multi-system involvement);For patient/therapist safety          AM-PAC PT "6 Clicks" Mobility   Outcome Measure  Help needed turning from your back to your side while in a flat bed without using bedrails?: Total Help needed moving from lying on your back to sitting on the side of a flat bed without using bedrails?: Total Help needed moving to and from a bed to a chair (including a wheelchair)?: Total Help needed standing up from a chair using your arms (e.g., wheelchair or bedside chair)?: Total Help needed to walk in hospital room?: Total Help needed climbing 3-5 steps with a railing? : Total 6 Click Score: 6    End of Session   Activity Tolerance: Patient tolerated treatment well Patient left: in bed;with call bell/phone within reach Nurse Communication: Mobility status PT Visit Diagnosis: Unsteadiness on feet (R26.81);History of falling (Z91.81);Repeated falls (R29.6);Difficulty in walking, not elsewhere classified (R26.2)      Time: [id number] PT Time Calculation (min) (ACUTE ONLY): 30 min  Charges:  $Therapeutic Activity: 8-22 mins                      Arby Barrette, PT Acute Rehabilitation Services  Pager [phone] Office [phone]    Rexanne Mano 04/21/2022, 9:54 AM

## 2022-04-21 NOTE — CV Procedure (Signed)
     TRANSESOPHAGEAL ECHOCARDIOGRAM   NAME:  Christian [name]   MRN: [id number] DOB:  July 06, 1951   ADMIT DATE: 03/30/2022  INDICATIONS: Bacteremia  PROCEDURE:   Informed consent was obtained prior to the procedure. The risks, benefits and alternatives for the procedure were discussed and the patient comprehended these risks.  Risks include, but are not limited to, cough, sore throat, vomiting, nausea, somnolence, esophageal and stomach trauma or perforation, bleeding, low blood pressure, aspiration, pneumonia, infection, trauma to the teeth and death.    After a procedural time-out, the oropharynx was anesthetized and the patient was sedated by the anesthesia service. The transesophageal probe was inserted in the esophagus and stomach without difficulty and multiple views were obtained. Anesthesia was monitored by Laban Emperor, CRNA and Dr Glennon Mac.    COMPLICATIONS:    There were no immediate complications.  FINDINGS:  No vegetation seen  [name] MD [facility]  [address] [place], [place] [address] [phone]   11:34 AM

## 2022-04-21 NOTE — TOC Progression Note (Signed)
Transition of Care [facility]) - Progression Note    Patient Details  Name: Christian [name] MRN: [id number] Date of Birth: 03-01-1951  Transition of Care [facility]) CM/SW Contact  Mearl Latin, LCSW Phone Number: 04/21/2022, 11:55 AM  Clinical Narrative:    CSW made [facility] aware that patient may be medically stable in a day or two per MD.    Expected Discharge Plan: Skilled Nursing Facility Barriers to Discharge: Continued Medical Work up  Expected Discharge Plan and Services Expected Discharge Plan: Skilled Nursing Facility In-house Referral: Clinical Social Work   Post Acute Care Choice: Skilled Nursing Facility Living arrangements for the past 2 months: Single Family Home, Skilled Nursing Facility                                       Social Determinants of Health (SDOH) Interventions    Readmission Risk Interventions    04/13/2022    3:06 PM 04/13/2022   10:43 AM 01/28/2022   10:17 AM  Readmission Risk Prevention Plan  Transportation Screening  Complete Complete  PCP or Specialist Appt within 5-7 Days   Complete  PCP or Specialist Appt within 3-5 Days Complete    Home Care Screening   Complete  Medication Review (RN CM)   Complete  HRI or Home Care Consult Complete    Social Work Consult for Recovery Care Planning/Counseling Complete    Palliative Care Screening Not Applicable    Medication Review [facility]) Referral to Pharmacy Complete   PCP or Specialist appointment within 3-5 days of discharge  Complete   HRI or Home Care Consult  Complete   SW Recovery Care/Counseling Consult  Complete   Palliative Care Screening  Not Applicable   Skilled Nursing Facility  Complete

## 2022-04-21 NOTE — Progress Notes (Signed)
PROGRESS NOTE        PATIENT DETAILS Name: Christian [name] Age: 71 y.o. Sex: male Date of Birth: 04-29-1951 Admit Date: 03/30/2022 Admitting Physician Rise Patience, MD PCP:Sun, Gari Crown, MD  Brief Summary: Patient is a 71 y.o.  male with a history of A-fib on Xarelto, HFpEF, PAD, DM-2, bipolar disorder-presented with generalized weakness/sacral decubitus ulcer-was found to have hypoglycemia, hyperkalemia and subsequently admitted to the hospitalist service-see below for further details.  Thought to have extrapyramidal symptoms-evaluated by psych/neurology-psych medications adjusted with clinical improvement.  Unfortunately-further hospital course complicated by development of fever-due to probable Streptococcus bacteremia and infected decubitus ulcer.  See below for further details.  Significant events: 5/2>> admit to [facility] for weakness/hypoglycemia/hyperkalemia/rigidity-thought to have EPS 5/14>> febrile/tachycardic-Per nursing staff-discharge from prior existing sacral decubitus ulcer.  Started on broad-spectrum IV antibiotics.  Prelim blood culture positive for Streptococcus species. 5/19>> wound care starting hydrotherapy   Significant studies: 5/3>> x-ray pelvis: No fracture. 5/3>> CT head: No acute intracranial findings. 5/3>> x-ray left shoulder: Rotator cuff impingement 5/14>> CXR:No Obvious PNA 5/15>> left upper extremity Doppler: No DVT. 5/16>> CT abdomen/pelvis: Inferior sacral decubitus ulceration-no evidence of osteomyelitis. 5/16>> Echo: EF 45-50%, no obvious vegetations.  Significant microbiology data: 5/14>> blood culture: Streptococcus constellatus  5/15>> blood culture: No growth  Procedures:   Consults:  Neurology, psychiatry, infectious disease, CCS, Pall care  Subjective: Patient in bed, appears comfortable, denies any headache, no fever, no chest pain or pressure, no shortness of breath , no abdominal pain. No new focal  weakness.  Objective: Vitals: Blood pressure (!) 153/91, pulse 85, temperature 98 F (36.7 C), temperature source Oral, resp. rate 20, height 6' (1.829 m), weight 109.8 kg, SpO2 94 %.   Exam:  Awake Alert, No new F.N deficits but chronically bedbound, contracture boots in both legs, severe chronic generalized weakness [place].AT,PERRAL Supple Neck, No JVD,   Symmetrical Chest wall movement, Good air movement bilaterally, CTAB RRR,No Gallops, Rubs or new Murmurs,  +ve B.Sounds, Abd Soft, No tenderness,   No Cyanosis, Clubbing or edema     Assessment/Plan:  Rigidity/tremors/mildly elevated CK: Felt to be more of extrapyramidal syndrome rather than NMS at this point.  Managed with supportive care.  After neurology/psych evaluation-has been resumed on Abilify and Cogentin-Wellbutrin & Seroquel have been discontinued.  Sepsis due to possible strep bacteremia-sacral decubitus ulcer with infection and underlying sacral abscess: Sepsis physiology improving-continues to have low-grade fever-once afebrile switch from Dapto to Zyvox (end date 5/31) and continue Augmentin until 5/24.  CT abdomen on 5/16 without any sacral osteomyelitis-TTE without vegetation.  Wound care following, had evidence of self opening of her sacral abscess with drainage on 04/17/2022 during wound care session, general surgery consulted, they have seen the patient no surgical intervention at this time per general surgery will continue wound care.  Case discussed with ID repeat CT done on 04/19/2022 is nonacute, TEE requested as well as he is still spiking low-grade fevers - TEE due on 04/21/22.  Repeat cultures drawn on 04/12/2022 are negative.  Per ID  Once fever resolve will switch dapto to linezolid Finish dapto-->linezolid on 5/31 Finish amox-clav [id number]    Acute metabolic encephalopathy: Due to sepsis--has significant amount of debility/deconditioning at baseline.    AKI on CKD stage IIIa: AKI likely hemodynamically  mediated-improved-creatinine close to baseline.   Mild rhabdomyolysis: Nontraumatic-improved with supportive  care.  Hypokalemia: Replaced  Hypoglycemia: Resolved with supportive care.  Do not resume Amaryl on discharge.  Hypothyroidism: Continue Cytomel.  PAF: Rate controlled-on Xarelto, currently switched to Lovenox for possible surgery if needed for sacral decubitus abscess.  PAD: Follow-up with vascular surgery in the outpatient setting  HFpEF with EF 45 to 50% on recent echocardiogram: Volume status stable-suspect has some amount of edema in lower extremities due to third spacing.  Demadex held due to sepsis physiology-should be able to resume over the next few days.  Bipolar disorder: Given concerns for EPS-on Abilify and Cogentin-Depakote/Wellbutrin and Seroquel held.  Left shoulder pain: Supportive care-likely rotator cuff injury.  Stable to be followed up by orthopedics in the outpatient setting  Left arm swelling: Likely due to IVF infiltration-could have superficial venous thrombosis-left upper extremity Doppler negative for DVT.  Supportive local care at this point.  Note patient is already on full dose anticoagulation for his A-fib.  OSA: CPAP nightly  DM-2 (A1c 7.6 on 4/15): CBG relatively stable-avoiding tight glycemic control-at risk for hypoglycemia-continue Semglee 10 units and SSI.  Recent Labs    04/20/22 1641 04/20/22 2108 04/21/22 0851  GLUCAP 209* 204* 96     Debility/deconditioning: Has had worsening of his chronic debility during this hospitalization-plans are of SNF on discharge.  Per family-he has been nonambulatory since his discharge from [facility] in April 2023.  This likely resulted in decubitus ulcer that the patient presented with.  Nutrition Status: Nutrition Problem: Increased nutrient needs Etiology: chronic illness, wound healing Signs/Symptoms: estimated needs Interventions: Ensure Enlive (each supplement provides 350kcal and 20 grams of  protein), MVI, Juven, Other (Comment) (Feeding assist)  Pressure Ulcer: Pressure Injury 03/31/22 Sacrum Mid Unstageable - Full thickness tissue loss in which the base of the injury is covered by slough (yellow, tan, gray, green or brown) and/or eschar (tan, brown or black) in the wound bed. (Active)  03/31/22 1933  Location: Sacrum  Location Orientation: Mid  Staging: Unstageable - Full thickness tissue loss in which the base of the injury is covered by slough (yellow, tan, gray, green or brown) and/or eschar (tan, brown or black) in the wound bed.  Wound Description (Comments):   Present on Admission: Yes  Dressing Type Foam - Lift dressing to assess site every shift;Dakin's-soaked gauze 04/20/22 2000   Obesity: Estimated body mass index is 32.83 kg/m as calculated from the following:   Height as of this encounter: 6' (1.829 m).   Weight as of this encounter: 109.8 kg.   Code status:   Code Status: Full Code   DVT Prophylaxis:    Family Communication: Daughter-[name] [phone] on 5/18/ 23 by previous MD.   Disposition Plan: Status is: Inpatient Remains inpatient appropriate because: Not stable for discharge-resolving sepsis physiology due to streptococcal bacteremia/infected sacral decubitus ulcer-continues to have low-grade fever-on IV daptomycin/Augmentin.  Hydrotherapy to be started on 5/19.   Planned Discharge Destination:Skilled nursing facility   Diet: Diet Order             Diet NPO time specified  Diet effective midnight                   Antimicrobial agents: MEDICATIONS: Scheduled Meds:  [MAR Hold] amoxicillin-clavulanate  1 tablet Oral Q12H   [MAR Hold] ARIPiprazole  10 mg Oral q morning   [MAR Hold] benztropine  1 mg Oral BID   [MAR Hold] Chlorhexidine Gluconate Cloth  6 each Topical Daily   [MAR Hold] diclofenac Sodium  2  g Topical QID   [MAR Hold] enoxaparin (LOVENOX) injection  100 mg Subcutaneous Q12H   [MAR Hold] feeding  supplement  237 mL Oral BID BM   [MAR Hold] insulin aspart  0-9 Units Subcutaneous TID WC   [MAR Hold] insulin glargine-yfgn  10 Units Subcutaneous Daily   [MAR Hold] liothyronine  10 mcg Oral Daily   [MAR Hold] mouth rinse  15 mL Mouth Rinse BID   [MAR Hold] multivitamin with minerals  1 tablet Oral Daily   [MAR Hold] nutrition supplement (JUVEN)  1 packet Oral BID BM   [MAR Hold] nystatin  5 mL Mouth/Throat QID   [MAR Hold] senna-docusate  2 tablet Oral BID   Continuous Infusions:  sodium chloride 20 mL/hr at 04/21/22 1057   [MAR Hold] DAPTOmycin (CUBICIN)  IV 700 mg (04/20/22 2012)   PRN Meds:.[MAR Hold] acetaminophen **OR** [MAR Hold] acetaminophen, [MAR Hold] bisacodyl, [MAR Hold] HYDROcodone-acetaminophen, [MAR Hold] melatonin   I have personally reviewed following labs and imaging studies  LABORATORY DATA:  Recent Labs  Lab 04/16/22 0234 04/18/22 0223 04/19/22 0210 04/20/22 0245 04/21/22 0114  WBC 11.4* 9.5 6.5 5.6 5.4  HGB 11.8* 10.5* 10.5* 12.2* 10.2*  HCT 36.9* 34.0* 33.3* 38.3* 32.9*  PLT 245 281 293 293 293  MCV 83.5 85.6 85.2 84.7 85.9  MCH 26.7 26.4 26.9 27.0 26.6  MCHC 32.0 30.9 31.5 31.9 31.0  RDW 17.7* 17.6* 17.6* 17.4* 17.2*  LYMPHSABS  --  1.5 1.0 1.4 1.3  MONOABS  --  0.5 0.4 0.3 0.4  EOSABS  --  0.1 0.2 0.1 0.1  BASOSABS  --  0.0 0.0 0.0 0.0    Recent Labs  Lab 04/16/22 0234 04/17/22 1121 04/18/22 0223 04/18/22 0918 04/19/22 0210 04/20/22 0245 04/21/22 0114  NA 142  --  144  --  143 140 136  K 3.6  --  3.0*  --  3.5 3.6 4.1  CL 113*  --  118*  --  116* 113* 110  CO2 23  --  22  --  22 21* 23  GLUCOSE 120*  --  94  --  122* 89 150*  BUN 37*  --  27*  --  23 17 16   CREATININE 1.28*  --  1.11  --  1.00 0.93 0.85  CALCIUM 9.1  --  9.0  --  8.9 8.8* 8.5*  AST  --   --  30  --  30 37 71*  ALT  --   --  24  --  24 28 44  ALKPHOS  --   --  55  --  58 62 54  BILITOT  --   --  0.5  --  0.6 0.8 0.4  ALBUMIN  --   --  <1.5*  --  1.5* 1.6* <1.5*   MG  --   --  2.0  --  1.8 1.8 1.8  CRP  --   --  17.9*  --  13.9* 10.1* 6.6*  PROCALCITON  --  0.27 0.24  --  0.11 0.12 0.11  AMMONIA  --   --   --  23  --   --   --   BNP  --   --  360.3*  --  596.1* 635.0* 361.2*   RADIOLOGY STUDIES/RESULTS: No results found.   LOS: 21 days   Signature  Lala Lund M.D on 04/21/2022 at 10:57 AM   -  To page go to [email or web address]

## 2022-04-21 NOTE — Interval H&P Note (Signed)
History and Physical Interval Note:  04/21/2022 10:51 AM  Christian [name]  has presented today for surgery, with the diagnosis of BACTEREMIA.  The various methods of treatment have been discussed with the patient and family. After consideration of risks, benefits and other options for treatment, the patient has consented to  Procedure(s): TRANSESOPHAGEAL ECHOCARDIOGRAM (TEE) (N/A) as a surgical intervention.  The patient's history has been reviewed, patient examined, no change in status, stable for surgery.  I have reviewed the patient's chart and labs.  Questions were answered to the patient's satisfaction.     [name]

## 2022-04-21 NOTE — Progress Notes (Signed)
Telephone consent obtained from Daughter [name] by 2 Rns.

## 2022-04-21 NOTE — Progress Notes (Signed)
Spoke to TEE dept. Regarding oral meds need to be held since pt requires apple sauce with his meds.

## 2022-04-21 NOTE — Anesthesia Postprocedure Evaluation (Signed)
Anesthesia Post Note  Patient: Gerrianne Scale Sage  Procedure(s) Performed: TRANSESOPHAGEAL ECHOCARDIOGRAM (TEE)     Patient location during evaluation: Endoscopy Anesthesia Type: General Level of consciousness: patient cooperative, sedated and oriented Pain management: pain level controlled Vital Signs Assessment: post-procedure vital signs reviewed and stable Respiratory status: nonlabored ventilation, spontaneous breathing, respiratory function stable and patient connected to nasal cannula oxygen Cardiovascular status: stable Postop Assessment: no apparent nausea or vomiting Anesthetic complications: no   No notable events documented.  Last Vitals:  Vitals:   04/21/22 1200 04/21/22 1248  BP: (!) 145/80 (!) 143/83  Pulse: 76 82  Resp: (!) 24 20  Temp:  36.9 C  SpO2: 93% 97%    Last Pain:  Vitals:   04/21/22 1300  TempSrc:   PainSc: 8                  Sahory Nordling,E. Shalon Salado

## 2022-04-21 NOTE — Progress Notes (Signed)
Palliative Medicine Progress Note   Patient Name: Christian [name]       Date: 04/22/2022 DOB: 1951/10/22  Age: 71 y.o. MRN#: [id number] Attending Physician: Leroy Sea, MD Primary Care Physician: [name], MD Admit Date: 03/30/2022   HPI/Patient Profile: 70 y.o. male  with past medical history of atrial fibrillation on Xarelto, HFpEF, PAD, DM type II, chronic kidney disease (baseline creatinine 1.6), and bipolar disorder.  He presented to the emergency department on 03/30/2022 with generalized weakness and sacral decubitus ulcer.  He was admitted to the hospitalist service with hypoglycemia and hyperkalemia.  There was initially concern of EPS.   On 5/14, he became febrile and tachycardic as well as developed discharge from his sacral decubitus.  He was started on broad-spectrum IV antibiotics.  Blood cultures came back positive for Streptococcus constellatus. On 5/16, CT abdomen/pelvis showed inferior sacral decubitus ulceration with no evidence of osteomyelitis.  Echo showed EF 45 to 50%, no obvious vegetations.  Subjective: Chart reviewed. Patient underwent TEE earlier today with no vegetation seen.  Note that surgery saw patient with PT hydrotherapy and feels that wound is improving.  I spoke with patient's daughter Christian [name] by phone to provide updates as above. Christian [name] inquires about patient's diet. Discussed that patient was seen by SLP yesterday 5/23 and was recommended to continue dysphagia 1 (puree) diet. Christian [name] expresses disappointment; she feels that being on a pureed diet is something that happens at end of life.   Christian [name] goes on to express much frustration with her father's overall medical condition. She feels he was "fine" prior to his hospitalization 01/26/22. We reviewed  that Wynne has multiple chronic conditions including diabetes, peripheral artery disease, atrial fibrillation, heart failure, chronic kidney disease.  He has also struggled with a very long history of mental illness. Reviewed that chronic illness results in decreased functional status over time, as patients do not usually return to previous baseline after a major illness.  Christian [name] expresses concern that he has not significantly improved during this hospitalization. She again expresses frustration regarding his mobility status. She questions whether there have been care deficits during this hospitalization.  I encouraged her to further discuss any potential concerns with the Office of Patient Experience and provided their contact information.  Christian [name] requests a follow-up discussion prior to patient leaving the hospital. I  let her know I would return to service Saturday 5/27.     Objective:  Physical Exam Constitutional:      General: He is sleeping. He is not in acute distress.    Appearance: He is obese.     Comments: Chronically ill-appearing  Pulmonary:     Effort: Pulmonary effort is normal.  Neurological:     Motor: Weakness present.            Vital Signs: BP 138/85 (BP Location: Left Arm)   Pulse 83   Temp 98.2 F (36.8 C) (Oral)   Resp 17   Ht 6' (1.829 m)   Wt 109.8 kg   SpO2 95%   BMI 32.83 kg/m  SpO2: SpO2: 95 % O2 Device: O2 Device: Room Air O2 Flow Rate: O2 Flow Rate (L/min): 2 L/min    LBM: Last BM Date : 04/21/22     Palliative Assessment/Data: PPS 40%     Palliative Medicine Assessment & Plan   Assessment: Principal Problem:   Hyperkalemia Active Problems:   Bacteremia   Diabetes mellitus type 2 in nonobese (HCC)   Sacral decubitus ulcer, stage II (HCC)   Severe sepsis without septic shock (HCC)   Hypoglycemia    Recommendations/Plan: Full code full scope Family is hopeful for improvement, but understands he will most likely not return to  baseline Short-term goal is for rehab to improve functional status PMT will continue to follow Outpatient palliative at discharge   Prognosis:  Unable to determine  Discharge Planning: Skilled Nursing Facility for rehab with Palliative care service follow-up    Thank you for allowing the Palliative Medicine Team to assist in the care of this patient.   Greater than 50%  of this time was spent counseling and coordinating care related to the above assessment and plan.  Total time: 68 minutes   Merry Proud, NP Palliative Medicine   Please contact Palliative Medicine Team phone at [phone] for questions and concerns.  For individual provider, see AMION.

## 2022-04-21 NOTE — Anesthesia Procedure Notes (Signed)
Procedure Name: MAC Date/Time: 04/21/2022 11:06 AM Performed by: Dorann Lodge, CRNA Pre-anesthesia Checklist: Patient identified, Emergency Drugs available, Suction available and Patient being monitored Patient Re-evaluated:Patient Re-evaluated prior to induction Oxygen Delivery Method: Nasal cannula Airway Equipment and Method: Bite block

## 2022-04-22 ENCOUNTER — Encounter (HOSPITAL_COMMUNITY): Payer: Self-pay | Admitting: Cardiology

## 2022-04-22 DIAGNOSIS — E875 Hyperkalemia: Secondary | ICD-10-CM | POA: Diagnosis not present

## 2022-04-22 LAB — CBC WITH DIFFERENTIAL/PLATELET
Abs Immature Granulocytes: 0.02 10*3/uL (ref 0.00–0.07)
Basophils Absolute: 0 10*3/uL (ref 0.0–0.1)
Basophils Relative: 0 %
Eosinophils Absolute: 0.1 10*3/uL (ref 0.0–0.5)
Eosinophils Relative: 3 %
HCT: 33.2 % — ABNORMAL LOW (ref 39.0–52.0)
Hemoglobin: 10.7 g/dL — ABNORMAL LOW (ref 13.0–17.0)
Immature Granulocytes: 0 %
Lymphocytes Relative: 28 %
Lymphs Abs: 1.3 10*3/uL (ref 0.7–4.0)
MCH: 27.2 pg (ref 26.0–34.0)
MCHC: 32.2 g/dL (ref 30.0–36.0)
MCV: 84.3 fL (ref 80.0–100.0)
Monocytes Absolute: 0.2 10*3/uL (ref 0.1–1.0)
Monocytes Relative: 5 %
Neutro Abs: 2.9 10*3/uL (ref 1.7–7.7)
Neutrophils Relative %: 64 %
Platelets: 320 10*3/uL (ref 150–400)
RBC: 3.94 MIL/uL — ABNORMAL LOW (ref 4.22–5.81)
RDW: 17 % — ABNORMAL HIGH (ref 11.5–15.5)
WBC: 4.5 10*3/uL (ref 4.0–10.5)
nRBC: 0 % (ref 0.0–0.2)

## 2022-04-22 LAB — COMPREHENSIVE METABOLIC PANEL
ALT: 50 U/L — ABNORMAL HIGH (ref 0–44)
AST: 63 U/L — ABNORMAL HIGH (ref 15–41)
Albumin: 1.5 g/dL — ABNORMAL LOW (ref 3.5–5.0)
Alkaline Phosphatase: 55 U/L (ref 38–126)
Anion gap: 3 — ABNORMAL LOW (ref 5–15)
BUN: 14 mg/dL (ref 8–23)
CO2: 25 mmol/L (ref 22–32)
Calcium: 8.6 mg/dL — ABNORMAL LOW (ref 8.9–10.3)
Chloride: 110 mmol/L (ref 98–111)
Creatinine, Ser: 0.75 mg/dL (ref 0.61–1.24)
GFR, Estimated: >60 mL/min (ref >60)
Glucose, Bld: 115 mg/dL — ABNORMAL HIGH (ref 70–99)
Potassium: 3.5 mmol/L (ref 3.5–5.1)
Sodium: 138 mmol/L (ref 135–145)
Total Bilirubin: 0.3 mg/dL (ref 0.3–1.2)
Total Protein: 5.7 g/dL — ABNORMAL LOW (ref 6.5–8.1)

## 2022-04-22 LAB — PROCALCITONIN: Procalcitonin: <0.1 ng/mL

## 2022-04-22 LAB — GLUCOSE, CAPILLARY
Glucose-Capillary: 91 mg/dL (ref 70–99)
Glucose-Capillary: 97 mg/dL (ref 70–99)
Glucose-Capillary: 140 mg/dL — ABNORMAL HIGH (ref 70–99)
Glucose-Capillary: 178 mg/dL — ABNORMAL HIGH (ref 70–99)

## 2022-04-22 LAB — C-REACTIVE PROTEIN: CRP: 4.4 mg/dL — ABNORMAL HIGH (ref <1.0)

## 2022-04-22 LAB — BRAIN NATRIURETIC PEPTIDE: B Natriuretic Peptide: 552.3 pg/mL — ABNORMAL HIGH (ref 0.0–100.0)

## 2022-04-22 LAB — MAGNESIUM: Magnesium: 1.7 mg/dL (ref 1.7–2.4)

## 2022-04-22 MED ORDER — METOPROLOL TARTRATE 50 MG PO TABS
50.0000 mg | ORAL_TABLET | Freq: Two times a day (BID) | ORAL | Status: DC
Start: 2022-04-22 — End: 2022-04-27
  Administered 2022-04-22 – 2022-04-26 (×10): 50 mg via ORAL
  Filled 2022-04-22 (×10): qty 1

## 2022-04-22 MED ORDER — MAGNESIUM SULFATE IN D5W 1-5 GM/100ML-% IV SOLN
1.0000 g | Freq: Once | INTRAVENOUS | Status: AC
  Administered 2022-04-22: 1 g via INTRAVENOUS
  Filled 2022-04-22: qty 100

## 2022-04-22 MED ORDER — POTASSIUM CHLORIDE 20 MEQ PO PACK
40.0000 meq | PACK | Freq: Once | ORAL | Status: AC
Start: 2022-04-22 — End: 2022-04-22
  Administered 2022-04-22: 40 meq via ORAL
  Filled 2022-04-22: qty 2

## 2022-04-22 MED ORDER — LINEZOLID 600 MG PO TABS
600.0000 mg | ORAL_TABLET | Freq: Two times a day (BID) | ORAL | Status: AC
  Administered 2022-04-22 – 2022-04-26 (×10): 600 mg via ORAL
  Filled 2022-04-22 (×10): qty 1

## 2022-04-22 MED ORDER — RIVAROXABAN 20 MG PO TABS
20.0000 mg | ORAL_TABLET | Freq: Every day | ORAL | Status: DC
Start: 2022-04-22 — End: 2022-05-14
  Administered 2022-04-22 – 2022-05-13 (×22): 20 mg via ORAL
  Filled 2022-04-22 (×23): qty 1

## 2022-04-22 NOTE — Progress Notes (Signed)
Speech Language Pathology Treatment: Dysphagia  Patient Details Name: Christian [name] MRN: [id number] DOB: 06/30/1951 Today's Date: 04/22/2022 Time: 1440-1500 SLP Time Calculation (min) (ACUTE ONLY): 20 min  Assessment / Plan / Recommendation Clinical Impression  Patient seen by SLP for skilled treatment session focused on dysphagia goals. Patient was agreeable to have some items from lunch tray but he did not want any of the puree solids. SLP trialed Dys 2 solids (vanilla pudding with broken up graham cracker pieces) and even without upper dentures in place (needs adhesive), patient exhibited efficient mastication and oral transit. No overt s/s aspiration or penetration with liquids or solids. Patient was agreeable to upgrading diet to Dys 2 solids and SLP will follow for toleration and ability to upgrade to Dys 3 (when dentures able to be worn).     HPI HPI: 71 y.o. male presented 5/2 with worsening strength , new decubitus ulcers and poor intake.  Recent admission for cellulitis and hypoglycemia, had just returned to ALF from SNF a week ago but not OOB since then. CXR shows Low lung volumes with mild atelectasis at the right lung base. No  acute infiltrate is identified.Marland Kitchen      SLP Plan  Continue with current plan of care      Recommendations for follow up therapy are one component of a multi-disciplinary discharge planning process, led by the attending physician.  Recommendations may be updated based on patient status, additional functional criteria and insurance authorization.    Recommendations  Diet recommendations: Dysphagia 2 (fine chop);Thin liquid Liquids provided via: Straw Medication Administration: Whole meds with liquid Supervision: Full supervision/cueing for compensatory strategies;Trained caregiver to feed patient Compensations: Slow rate;Small sips/bites;Minimize environmental distractions Postural Changes and/or Swallow Maneuvers: Seated upright 90 degrees;Upright  30-60 min after meal                Oral Care Recommendations: Oral care BID;Staff/trained caregiver to provide oral care Follow Up Recommendations: Skilled nursing-short term rehab (<3 hours/day) Assistance recommended at discharge: Frequent or constant Supervision/Assistance SLP Visit Diagnosis: Dysphagia, unspecified (R13.10) Plan: Continue with current plan of care          Sonia Baller, MA, CCC-SLP Speech Therapy

## 2022-04-22 NOTE — Progress Notes (Incomplete)
[facility]  Date of Admission:  03/30/2022   Total days of inpatient antibiotics 22  Principal Problem:   Hyperkalemia Active Problems:   Bacteremia   Diabetes mellitus type 2 in nonobese (HCC)   Sacral decubitus ulcer, stage II (HCC)   Severe sepsis without septic shock (HCC)   Hypoglycemia          Assessment: 70 YM admitted from SNF with generalized weakness. Found to have strep constellatus bacteremia(CTX an PEN I) and fevers. He has been on dapto and augmentin.   #Strep Constellatus bacteremia 2/2 sacral ulcer -CT AP showed no OM but did note a subcutaneous fluid collection with evidence of soft tissue infection a the inferiori sacral decubitus ulceration. - CT chest did not show consolidation and -CTH wihout acute abnormality -TTE showed moderate thickening of aortic valve. TEE showed no signs of vegetation Plan: -D/C daptomycin -Start linezolid to complete 2 weeks of antibiotics form negative C for bactermia (EOT 5/28) -Complete 2 week of Augmentin for SSTI coverage(EOT 5/26)     Microbiology:   Antibiotics: Augmentin 5/17-p Daptomycin 5/17-p Cefepime 5/13014 Ceftriaxone 5/15-16 Metronidazole 5/13-16 Cultures: Blood  Urine  Other   SUBJECTIVE: Resting in bed. No new complaints.  Interval:Afebrile overnight. Wbc 4.5k.   Review of Systems: Review of Systems  All other systems reviewed and are negative.   Scheduled Meds: . amoxicillin-clavulanate  1 tablet Oral Q12H  . ARIPiprazole  10 mg Oral q morning  . benztropine  1 mg Oral BID  . Chlorhexidine Gluconate Cloth  6 each Topical Daily  . collagenase   Topical Daily  . diclofenac Sodium  2 g Topical QID  . feeding supplement  237 mL Oral BID BM  . insulin aspart  0-9 Units Subcutaneous TID WC  . insulin glargine-yfgn  10 Units Subcutaneous Daily  . linezolid  600 mg Oral Q12H  . liothyronine  10 mcg Oral Daily  . mouth rinse  15 mL Mouth Rinse BID  .  metoprolol tartrate  50 mg Oral BID  . multivitamin with minerals  1 tablet Oral Daily  . nutrition supplement (JUVEN)  1 packet Oral BID BM  . nystatin  5 mL Mouth/Throat QID  . rivaroxaban  20 mg Oral Q supper  . senna-docusate  2 tablet Oral BID   Continuous Infusions: PRN Meds:.acetaminophen **OR** acetaminophen, bisacodyl, HYDROcodone-acetaminophen, melatonin Allergies  Allergen Reactions  . Amlodipine Swelling and Other (See Comments)    Legs became swollen  . Onglyza [Saxagliptin] Other (See Comments)    "Allergic," per [facility]  . Zyprexa [Olanzapine] Other (See Comments)    Allergy noted by ACT Team. No reaction specified (??)- "Allergic," per [facility]  . Ritalin [Methylphenidate] Anxiety and Other (See Comments)    "[place]"    OBJECTIVE: Vitals:   04/22/22 0806 04/22/22 1200 04/22/22 1600 04/22/22 1930  BP: (!) 130/91 138/85 124/81 (!) 145/92  Pulse: 75 83 81 82  Resp: 15 17 18  (!) 28  Temp: 98.2 F (36.8 C) 98.2 F (36.8 C)  98.1 F (36.7 C)  TempSrc: Oral Oral  Oral  SpO2: 98% 95% 99% 96%  Weight:      Height:       Body mass index is 32.83 kg/m.  Physical Exam Constitutional:      General: He is not in acute distress.    Appearance: He is normal weight. He is not toxic-appearing.  HENT:  Head: Normocephalic and atraumatic.     Right Ear: External ear normal.     Left Ear: External ear normal.     Nose: No congestion or rhinorrhea.     Mouth/Throat:     Mouth: Mucous membranes are moist.     Pharynx: Oropharynx is clear.  Eyes:     Extraocular Movements: Extraocular movements intact.     Conjunctiva/sclera: Conjunctivae normal.     Pupils: Pupils are equal, round, and reactive to light.  Cardiovascular:     Rate and Rhythm: Normal rate and regular rhythm.     Heart sounds: No murmur heard.   No friction rub. No gallop.  Pulmonary:     Effort: Pulmonary effort is normal.     Breath sounds: Normal breath sounds.   Abdominal:     General: Abdomen is flat. Bowel sounds are normal.     Palpations: Abdomen is soft.  Musculoskeletal:        General: No swelling.     Cervical back: Normal range of motion and neck supple.  Skin:    General: Skin is warm and dry.  Neurological:     General: No focal deficit present.     Mental Status: He is oriented to person, place, and time.  Psychiatric:        Mood and Affect: Mood normal.      Lab Results Lab Results  Component Value Date   WBC 4.5 04/22/2022   HGB 10.7 (L) 04/22/2022   HCT 33.2 (L) 04/22/2022   MCV 84.3 04/22/2022   PLT 320 04/22/2022    Lab Results  Component Value Date   CREATININE 0.75 04/22/2022   BUN 14 04/22/2022   NA 138 04/22/2022   K 3.5 04/22/2022   CL 110 04/22/2022   CO2 25 04/22/2022    Lab Results  Component Value Date   ALT 50 (H) 04/22/2022   AST 63 (H) 04/22/2022   ALKPHOS 55 04/22/2022   BILITOT 0.3 04/22/2022        Danelle Earthly, MD [facility] 04/22/2022, 9:55 PM

## 2022-04-22 NOTE — Progress Notes (Signed)
PROGRESS NOTE        PATIENT DETAILS Name: Christian [name] Age: 71 y.o. Sex: male Date of Birth: [birth year] Admit Date: 03/30/2022 Admitting Physician Rise Patience, MD PCP:Sun, Gari Crown, MD  Brief Summary: Patient is a 71 y.o.  male with a history of A-fib on Xarelto, HFpEF, PAD, DM-2, bipolar disorder-presented with generalized weakness/sacral decubitus ulcer-was found to have hypoglycemia, hyperkalemia and subsequently admitted to the hospitalist service-see below for further details.  Thought to have extrapyramidal symptoms-evaluated by psych/neurology-psych medications adjusted with clinical improvement.  Unfortunately-further hospital course complicated by development of fever-due to probable Streptococcus bacteremia and infected decubitus ulcer.  See below for further details.  Significant events: 5/2>> admit to [facility] for weakness/hypoglycemia/hyperkalemia/rigidity-thought to have EPS 5/14>> febrile/tachycardic-Per nursing staff-discharge from prior existing sacral decubitus ulcer.  Started on broad-spectrum IV antibiotics.  Prelim blood culture positive for Streptococcus species. 5/19>> wound care starting hydrotherapy   Significant studies: 5/3>> x-ray pelvis: No fracture. 5/3>> CT head: No acute intracranial findings. 5/3>> x-ray left shoulder: Rotator cuff impingement 5/14>> CXR:No Obvious PNA 5/15>> left upper extremity Doppler: No DVT. 5/16>> CT abdomen/pelvis: Inferior sacral decubitus ulceration-no evidence of osteomyelitis. 5/16>> Echo: EF 45-50%, no obvious vegetations.  Significant microbiology data: 5/14>> blood culture: Streptococcus constellatus  5/15>> blood culture: No growth  Procedures:   Consults:  Neurology, psychiatry, infectious disease, CCS, Pall care  Subjective: Patient in bed, appears comfortable, denies any headache, no fever, no chest pain or pressure, no shortness of breath , no abdominal pain. No new focal  weakness.   Objective: Vitals: Blood pressure (!) 130/91, pulse 75, temperature 98.2 F (36.8 C), temperature source Oral, resp. rate 15, height 6' (1.829 m), weight 109.8 kg, SpO2 98 %.   Exam:  Awake Alert, No new F.N deficits but chronically bedbound, contracture boots in both legs, severe chronic generalized weakness [place].AT,PERRAL Supple Neck, No JVD,   Symmetrical Chest wall movement, Good air movement bilaterally, CTAB RRR,No Gallops, Rubs or new Murmurs,  +ve B.Sounds, Abd Soft, No tenderness,   No Cyanosis, Clubbing or edema     Assessment/Plan:  Rigidity/tremors/mildly elevated CK: Felt to be more of extrapyramidal syndrome rather than NMS at this point.  Managed with supportive care.  After neurology/psych evaluation-has been resumed on Abilify and Cogentin-Wellbutrin & Seroquel have been discontinued.  Sepsis due to possible strep bacteremia-sacral decubitus ulcer with infection and underlying sacral abscess: Sepsis physiology improving-continues to have low-grade fever-once afebrile switch from Dapto to Zyvox (end date 5/31) and continue Augmentin until 5/24.  CT abdomen on 5/16 without any sacral osteomyelitis-TTE without vegetation.  Wound care following, had evidence of self opening of her sacral abscess with drainage on 04/17/2022 during wound care session, general surgery consulted, they have seen the patient no surgical intervention at this time per general surgery will continue wound care.  Case discussed with ID repeat CT done on 04/19/2022 is nonacute, TEE done on 04/21/22 was -ve.  Repeat cultures drawn on 04/12/2022 are negative.  I have requested ID to specify final antibiotic recommendations.  So far Per ID  Once fever resolve will switch dapto to linezolid Finish dapto-->linezolid on 5/31 Finish amox-clav [id number]    Acute metabolic encephalopathy: Due to sepsis--has significant amount of debility/deconditioning at baseline.    AKI on CKD stage IIIa: AKI likely  hemodynamically mediated-improved-creatinine close to baseline.   Mild rhabdomyolysis:  Nontraumatic-improved with supportive care.  Hypokalemia: Replaced  Hypoglycemia: Resolved with supportive care.  Do not resume Amaryl on discharge.  Hypothyroidism: Continue Cytomel.  PAF: Rate controlled-on Xarelto, added Lopressor as well.  HTN - added Lopressor.  PAD: Follow-up with vascular surgery in the outpatient setting  HFpEF with EF 45 to 50% on recent echocardiogram: Volume status stable-suspect has some amount of edema in lower extremities due to third spacing.  Demadex held due to sepsis physiology-should be able to resume over the next few days.  Bipolar disorder: Given concerns for EPS-on Abilify and Cogentin-Depakote/Wellbutrin and Seroquel held.  Left shoulder pain: Supportive care-likely rotator cuff injury.  Stable to be followed up by orthopedics in the outpatient setting  Left arm swelling: Likely due to IVF infiltration-could have superficial venous thrombosis-left upper extremity Doppler negative for DVT.  Supportive local care at this point.  Note patient is already on full dose anticoagulation for his A-fib.  OSA: CPAP nightly  DM-2 (A1c 7.6 on 4/15): CBG relatively stable-avoiding tight glycemic control-at risk for hypoglycemia-continue Semglee 10 units and SSI.  Recent Labs    04/21/22 1714 04/21/22 2032 04/22/22 0809  GLUCAP 159* 137* 91     Debility/deconditioning: Has had worsening of his chronic debility during this hospitalization-plans are of SNF on discharge.  Per family-he has been nonambulatory since his discharge from [facility] in April 2023.  This likely resulted in decubitus ulcer that the patient presented with.  Nutrition Status: Nutrition Problem: Increased nutrient needs Etiology: chronic illness, wound healing Signs/Symptoms: estimated needs Interventions: Ensure Enlive (each supplement provides 350kcal and 20 grams of protein), MVI, Juven,  Other (Comment) (Feeding assist)  Pressure Ulcer: Pressure Injury 03/31/22 Sacrum Mid Unstageable - Full thickness tissue loss in which the base of the injury is covered by slough (yellow, tan, gray, green or brown) and/or eschar (tan, brown or black) in the wound bed. (Active)  03/31/22 1933  Location: Sacrum  Location Orientation: Mid  Staging: Unstageable - Full thickness tissue loss in which the base of the injury is covered by slough (yellow, tan, gray, green or brown) and/or eschar (tan, brown or black) in the wound bed.  Wound Description (Comments):   Present on Admission: Yes  Dressing Type Foam - Lift dressing to assess site every shift;Dakin's-soaked gauze 04/21/22 1924   Obesity: Estimated body mass index is 32.83 kg/m as calculated from the following:   Height as of this encounter: 6' (1.829 m).   Weight as of this encounter: 109.8 kg.   Code status:   Code Status: Full Code   DVT Prophylaxis:    Family Communication: Daughter-[name] [id number] on 5/18/ 23 by previous MD.   Disposition Plan: Status is: Inpatient Remains inpatient appropriate because: Not stable for discharge-resolving sepsis physiology due to streptococcal bacteremia/infected sacral decubitus ulcer-continues to have low-grade fever-on IV daptomycin/Augmentin.  Hydrotherapy to be started on 5/19.   Planned Discharge Destination:Skilled nursing facility   Diet: Diet Order             DIET - DYS 1 Room service appropriate? Yes; Fluid consistency: Thin  Diet effective now                   Antimicrobial agents: MEDICATIONS: Scheduled Meds:  amoxicillin-clavulanate  1 tablet Oral Q12H   ARIPiprazole  10 mg Oral q morning   benztropine  1 mg Oral BID   Chlorhexidine Gluconate Cloth  6 each Topical Daily   collagenase   Topical Daily   diclofenac  Sodium  2 g Topical QID   enoxaparin (LOVENOX) injection  100 mg Subcutaneous Q12H   feeding supplement  237 mL Oral BID BM    insulin aspart  0-9 Units Subcutaneous TID WC   insulin glargine-yfgn  10 Units Subcutaneous Daily   linezolid  600 mg Oral Q12H   liothyronine  10 mcg Oral Daily   mouth rinse  15 mL Mouth Rinse BID   multivitamin with minerals  1 tablet Oral Daily   nutrition supplement (JUVEN)  1 packet Oral BID BM   nystatin  5 mL Mouth/Throat QID   potassium chloride  40 mEq Oral Once   senna-docusate  2 tablet Oral BID   Continuous Infusions:  magnesium sulfate bolus IVPB     PRN Meds:.acetaminophen **OR** acetaminophen, bisacodyl, HYDROcodone-acetaminophen, melatonin   I have personally reviewed following labs and imaging studies  LABORATORY DATA:  Recent Labs  Lab 04/18/22 0223 04/19/22 0210 04/20/22 0245 04/21/22 0114 04/22/22 0359  WBC 9.5 6.5 5.6 5.4 4.5  HGB 10.5* 10.5* 12.2* 10.2* 10.7*  HCT 34.0* 33.3* 38.3* 32.9* 33.2*  PLT 281 293 293 293 320  MCV 85.6 85.2 84.7 85.9 84.3  MCH 26.4 26.9 27.0 26.6 27.2  MCHC 30.9 31.5 31.9 31.0 32.2  RDW 17.6* 17.6* 17.4* 17.2* 17.0*  LYMPHSABS 1.5 1.0 1.4 1.3 1.3  MONOABS 0.5 0.4 0.3 0.4 0.2  EOSABS 0.1 0.2 0.1 0.1 0.1  BASOSABS 0.0 0.0 0.0 0.0 0.0    Recent Labs  Lab 04/18/22 0223 04/18/22 0918 04/19/22 0210 04/20/22 0245 04/21/22 0114 04/22/22 0359  NA 144  --  143 140 136 138  K 3.0*  --  3.5 3.6 4.1 3.5  CL 118*  --  116* 113* 110 110  CO2 22  --  22 21* 23 25  GLUCOSE 94  --  122* 89 150* 115*  BUN 27*  --  23 17 16 14   CREATININE 1.11  --  1.00 0.93 0.85 0.75  CALCIUM 9.0  --  8.9 8.8* 8.5* 8.6*  AST 30  --  30 37 71* 63*  ALT 24  --  24 28 44 50*  ALKPHOS 55  --  58 62 54 55  BILITOT 0.5  --  0.6 0.8 0.4 0.3  ALBUMIN <1.5*  --  1.5* 1.6* <1.5* 1.5*  MG 2.0  --  1.8 1.8 1.8 1.7  CRP 17.9*  --  13.9* 10.1* 6.6* 4.4*  PROCALCITON 0.24  --  0.11 0.12 0.11 <0.10  AMMONIA  --  23  --   --   --   --   BNP 360.3*  --  596.1* 635.0* 361.2* 552.3*   RADIOLOGY STUDIES/RESULTS: ECHO TEE  Result Date: 04/21/2022     TRANSESOPHOGEAL ECHO REPORT   Patient Name:   Christian [name] Date of Exam: 04/21/2022 Medical Rec #:  [id number]        Height:       72.0 in Accession #:    [id number]       Weight:       242.1 lb Date of Birth:  1951-08-28        BSA:          2.311 m Patient Age:    16 years         BP:           143/84 mmHg Patient Gender: M  HR:           66 bpm. Exam Location:  Inpatient Procedure: 3D Echo, Transesophageal Echo, Cardiac Doppler and Color Doppler Indications:     Bacteremia  History:         Patient has prior history of Echocardiogram examinations, most                  recent 04/13/2022. Previous Myocardial Infarction, Abnormal ECG,                  Arrythmias:Atrial Flutter, Signs/Symptoms:Altered Mental                  Status, Syncope, Shortness of Breath and Dyspnea; Risk                  Factors:Sleep Apnea, Dyslipidemia and Hypertension. Shock. CKD.  Sonographer:     Roseanna Rainbow RDCS Referring Phys:  [id number] Margie Billet Diagnosing Phys: [name] MD PROCEDURE: After discussion of the risks and benefits of a TEE, an informed consent was obtained from the patient. The transesophogeal probe was passed without difficulty through the esophogus of the patient. Imaged were obtained with the patient in a left lateral decubitus position. Sedation performed by different physician. The patient was monitored while under deep sedation. Anesthestetic sedation was provided intravenously by Anesthesiology: 373mg  of Propofol, 100mg  of Lidocaine. The patient's vital signs; including heart rate, blood pressure, and oxygen saturation; remained stable throughout the procedure. The patient developed no complications during the procedure. IMPRESSIONS  1. Left ventricular ejection fraction, by estimation, is 50 to 55%. The left ventricle has low normal function.  2. Right ventricular systolic function is normal. The right ventricular size is normal.  3. Left atrial size was mildly dilated. No left  atrial/left atrial appendage thrombus was detected.  4. The mitral valve is degenerative. Trivial mitral valve regurgitation.  5. The aortic valve is calcified. There is moderate calcification of the aortic valve. Aortic valve regurgitation is not visualized. Aortic valve sclerosis/calcification is present, without any evidence of aortic stenosis. Conclusion(s)/Recommendation(s): No evidence of vegetation/infective endocarditis on this transesophageael echocardiogram. FINDINGS  Left Ventricle: Left ventricular ejection fraction, by estimation, is 50 to 55%. The left ventricle has low normal function. The left ventricular internal cavity size was normal in size. Right Ventricle: The right ventricular size is normal. No increase in right ventricular wall thickness. Right ventricular systolic function is normal. Left Atrium: Left atrial size was mildly dilated. No left atrial/left atrial appendage thrombus was detected. Right Atrium: Right atrial size was normal in size. Pericardium: Trivial pericardial effusion is present. Mitral Valve: The mitral valve is degenerative in appearance. Trivial mitral valve regurgitation. Tricuspid Valve: The tricuspid valve is normal in structure. Tricuspid valve regurgitation is trivial. Aortic Valve: The aortic valve is calcified. There is moderate calcification of the aortic valve. Aortic valve regurgitation is not visualized. Aortic valve sclerosis/calcification is present, without any evidence of aortic stenosis. Pulmonic Valve: The pulmonic valve was normal in structure. Pulmonic valve regurgitation is not visualized. Aorta: The aortic root is normal in size and structure. IAS/Shunts: No atrial level shunt detected by color flow Doppler.   3D Volume EF LV 3D EDV:   119.81 ml LV 3D ESV:   61.66 ml [name] MD Electronically signed by [name] MD Signature Date/Time: 04/21/2022/3:19:22 PM    Final      LOS: 22 days   Signature  Lala Lund M.D on  04/22/2022 at 10:19 AM   -  To page go to [email or web address]

## 2022-04-22 NOTE — Progress Notes (Signed)
[facility]  Date of Admission:  03/30/2022   Total days of inpatient antibiotics 22  Principal Problem:   Hyperkalemia Active Problems:   Bacteremia   Diabetes mellitus type 2 in nonobese (HCC)   Sacral decubitus ulcer, stage II (HCC)   Severe sepsis without septic shock (HCC)   Hypoglycemia          Assessment: 70 YM admitted from SNF with generalized weakness. Found to have strep constellatus bacteremia(CTX an PEN I) and fevers. He has been on dapto and augmentin.     #Strep Constellatus bacteremia 2/2 sacral ulcer -CT AP showed no OM but did note a subcutaneous fluid collection with evidence of soft tissue infection a the inferiori sacral decubitus ulceration. - CT chest did not show consolidation and -CTH wihout acute abnormality -TTE showed moderate thickening of aortic valve. TEE showed no signs of vegetation Plan: -D/C daptomycin -Start linezolid to complete 2 weeks of antibiotics form negative C for bactermia (EOT 5/28) -Complete 2 week of Augmentin for SSTI coverage(EOT 5/26)      Microbiology:   Antibiotics: Augmentin 5/17-p Daptomycin 5/17-p Cefepime 5/13014 Ceftriaxone 5/15-16 Metronidazole 5/13-16 Cultures: Blood  5/13 2/2 strep constellatus 5/15 NG    SUBJECTIVE: Resting in bed. No new complaints.  Interval:Afebrile overnight. Wbc 4.5k.     Review of Systems: Review of Systems  All other systems reviewed and are negative.   Scheduled Meds:  amoxicillin-clavulanate  1 tablet Oral Q12H   ARIPiprazole  10 mg Oral q morning   benztropine  1 mg Oral BID   Chlorhexidine Gluconate Cloth  6 each Topical Daily   collagenase   Topical Daily   diclofenac Sodium  2 g Topical QID   feeding supplement  237 mL Oral BID BM   insulin aspart  0-9 Units Subcutaneous TID WC   insulin glargine-yfgn  10 Units Subcutaneous Daily   linezolid  600 mg Oral Q12H   liothyronine  10 mcg Oral Daily   mouth rinse  15 mL Mouth  Rinse BID   metoprolol tartrate  50 mg Oral BID   multivitamin with minerals  1 tablet Oral Daily   nutrition supplement (JUVEN)  1 packet Oral BID BM   rivaroxaban  20 mg Oral Q supper   senna-docusate  2 tablet Oral BID   Continuous Infusions: PRN Meds:.acetaminophen **OR** acetaminophen, bisacodyl, HYDROcodone-acetaminophen, melatonin Allergies  Allergen Reactions   Amlodipine Swelling and Other (See Comments)    Legs became swollen   Onglyza [Saxagliptin] Other (See Comments)    "Allergic," per [facility]   Zyprexa [Olanzapine] Other (See Comments)    Allergy noted by ACT Team. No reaction specified (??)- "Allergic," per [facility]   Ritalin [Methylphenidate] Anxiety and Other (See Comments)    "Maniac"    OBJECTIVE: Vitals:   04/22/22 0806 04/22/22 1200 04/22/22 1600 04/22/22 1930  BP: (!) 130/91 138/85 124/81 (!) 145/92  Pulse: 75 83 81 82  Resp: 15 17 18  (!) 28  Temp: 98.2 F (36.8 C) 98.2 F (36.8 C)  98.1 F (36.7 C)  TempSrc: Oral Oral  Oral  SpO2: 98% 95% 99% 96%  Weight:      Height:       Body mass index is 32.83 kg/m.  Physical Exam Constitutional:      General: He is not in acute distress.    Appearance: He is normal weight. He is not toxic-appearing.  HENT:  Head: Normocephalic and atraumatic.     Right Ear: External ear normal.     Left Ear: External ear normal.     Nose: No congestion or rhinorrhea.     Mouth/Throat:     Mouth: Mucous membranes are moist.     Pharynx: Oropharynx is clear.  Eyes:     Extraocular Movements: Extraocular movements intact.     Conjunctiva/sclera: Conjunctivae normal.     Pupils: Pupils are equal, round, and reactive to light.  Cardiovascular:     Rate and Rhythm: Normal rate and regular rhythm.     Heart sounds: No murmur heard.   No friction rub. No gallop.  Pulmonary:     Effort: Pulmonary effort is normal.     Breath sounds: Normal breath sounds.  Abdominal:     General: Abdomen is  flat. Bowel sounds are normal.     Palpations: Abdomen is soft.  Musculoskeletal:        General: No swelling. Normal range of motion.     Cervical back: Normal range of motion and neck supple.  Skin:    General: Skin is warm and dry.  Neurological:     General: No focal deficit present.     Mental Status: He is oriented to person, place, and time.  Psychiatric:        Mood and Affect: Mood normal.      Lab Results Lab Results  Component Value Date   WBC 4.5 04/22/2022   HGB 10.7 (L) 04/22/2022   HCT 33.2 (L) 04/22/2022   MCV 84.3 04/22/2022   PLT 320 04/22/2022    Lab Results  Component Value Date   CREATININE 0.75 04/22/2022   BUN 14 04/22/2022   NA 138 04/22/2022   K 3.5 04/22/2022   CL 110 04/22/2022   CO2 25 04/22/2022    Lab Results  Component Value Date   ALT 50 (H) 04/22/2022   AST 63 (H) 04/22/2022   ALKPHOS 55 04/22/2022   BILITOT 0.3 04/22/2022        Danelle Earthly, MD [facility] 04/22/2022, 10:08 PM

## 2022-04-22 NOTE — Progress Notes (Signed)
ANTICOAGULATION CONSULT NOTE - Follow Up Consult  Pharmacy Consult for Lovenox to Xarelto Indication: atrial fibrillation  Allergies  Allergen Reactions   Amlodipine Swelling and Other (See Comments)    Legs became swollen   Onglyza [Saxagliptin] Other (See Comments)    "Allergic," per [facility]   Zyprexa [Olanzapine] Other (See Comments)    Allergy noted by ACT Team. No reaction specified (??)- "Allergic," per [facility]   Ritalin [Methylphenidate] Anxiety and Other (See Comments)    "Maniac"    Labs: Recent Labs    04/20/22 0245 04/21/22 0114 04/22/22 0359  HGB 12.2* 10.2* 10.7*  HCT 38.3* 32.9* 33.2*  PLT 293 293 320  CREATININE 0.93 0.85 0.75     Estimated Creatinine Clearance: 113.1 mL/min (by C-G formula based on SCr of 0.75 mg/dL).  Assessment: Patient was admitted 03/30/2022 for weakness and sacral decubitus ulcers. PTA rivaroxaban for atrial fibrillation was on hold and Lovenox was started. No surgery planned. Pharmacy consulted to transition back to rivaroxaban. Last Lovenox dose 09:51 this morning.  Renal function is stable, CBC is stable.  Plan:  Discontinue Lovenox Rivaroxaban 20 mg PO daily with supper - begin 22:00 tonight due to time of last Lovenox dose Pharmacy signing off but will continue to follow peripherally - please re-consult if needed  Thank you for involving pharmacy in this patient's care.  Renold Genta, PharmD, BCPS Clinical Pharmacist Clinical phone for 04/22/2022 until 3p is [phone] 04/22/2022 10:48 AM  **Pharmacist phone directory can be found on [email or web address] listed under [place]**

## 2022-04-22 NOTE — TOC Progression Note (Signed)
Transition of Care [facility]) - Progression Note    Patient Details  Name: Christian [name] MRN: [id number] Date of Birth: Mar 09, 1951  [facility] CM/SW Contact  Mearl Latin, LCSW Phone Number: 04/22/2022, 5:06 PM  Clinical Narrative:    Per MD, patient would benefit from continued hydrotherapy if possible. Daughter is requesting this as well, as she believes his wounds worsened at the hospital. CSW requested daughter's preference of [facility] patient.    Expected Discharge Plan: Skilled Nursing Facility Barriers to Discharge: Continued Medical Work up  Expected Discharge Plan and Services Expected Discharge Plan: Skilled Nursing Facility In-house Referral: Clinical Social Work   Post Acute Care Choice: Skilled Nursing Facility Living arrangements for the past 2 months: Single Family Home, Skilled Nursing Facility                                       Social Determinants of Health (SDOH) Interventions    Readmission Risk Interventions    04/13/2022    3:06 PM 04/13/2022   10:43 AM 01/28/2022   10:17 AM  Readmission Risk Prevention Plan  Transportation Screening  Complete Complete  PCP or Specialist Appt within 5-7 Days   Complete  PCP or Specialist Appt within 3-5 Days Complete    Home Care Screening   Complete  Medication Review (RN CM)   Complete  HRI or Home Care Consult Complete    Social Work Consult for Recovery Care Planning/Counseling Complete    Palliative Care Screening Not Applicable    Medication Review Oceanographer) Referral to Pharmacy Complete   PCP or Specialist appointment within 3-5 days of discharge  Complete   HRI or Home Care Consult  Complete   SW Recovery Care/Counseling Consult  Complete   Palliative Care Screening  Not Applicable   Skilled Nursing Facility  Complete

## 2022-04-23 DIAGNOSIS — M25512 Pain in left shoulder: Secondary | ICD-10-CM

## 2022-04-23 DIAGNOSIS — E1159 Type 2 diabetes mellitus with other circulatory complications: Secondary | ICD-10-CM

## 2022-04-23 DIAGNOSIS — N1831 Chronic kidney disease, stage 3a: Secondary | ICD-10-CM

## 2022-04-23 DIAGNOSIS — R7881 Bacteremia: Secondary | ICD-10-CM | POA: Diagnosis not present

## 2022-04-23 DIAGNOSIS — R6520 Severe sepsis without septic shock: Secondary | ICD-10-CM | POA: Diagnosis not present

## 2022-04-23 DIAGNOSIS — Z515 Encounter for palliative care: Secondary | ICD-10-CM

## 2022-04-23 DIAGNOSIS — I130 Hypertensive heart and chronic kidney disease with heart failure and stage 1 through stage 4 chronic kidney disease, or unspecified chronic kidney disease: Secondary | ICD-10-CM

## 2022-04-23 DIAGNOSIS — G9340 Encephalopathy, unspecified: Secondary | ICD-10-CM

## 2022-04-23 DIAGNOSIS — R2232 Localized swelling, mass and lump, left upper limb: Secondary | ICD-10-CM

## 2022-04-23 DIAGNOSIS — A419 Sepsis, unspecified organism: Secondary | ICD-10-CM | POA: Diagnosis not present

## 2022-04-23 LAB — CBC WITH DIFFERENTIAL/PLATELET
Abs Immature Granulocytes: 0.04 10*3/uL (ref 0.00–0.07)
Basophils Absolute: 0 10*3/uL (ref 0.0–0.1)
Basophils Relative: 0 %
Eosinophils Absolute: 0.1 10*3/uL (ref 0.0–0.5)
Eosinophils Relative: 3 %
HCT: 36.2 % — ABNORMAL LOW (ref 39.0–52.0)
Hemoglobin: 11.3 g/dL — ABNORMAL LOW (ref 13.0–17.0)
Immature Granulocytes: 1 %
Lymphocytes Relative: 25 %
Lymphs Abs: 1.3 10*3/uL (ref 0.7–4.0)
MCH: 26.7 pg (ref 26.0–34.0)
MCHC: 31.2 g/dL (ref 30.0–36.0)
MCV: 85.4 fL (ref 80.0–100.0)
Monocytes Absolute: 0.3 10*3/uL (ref 0.1–1.0)
Monocytes Relative: 5 %
Neutro Abs: 3.3 10*3/uL (ref 1.7–7.7)
Neutrophils Relative %: 66 %
Platelets: 302 10*3/uL (ref 150–400)
RBC: 4.24 MIL/uL (ref 4.22–5.81)
RDW: 17 % — ABNORMAL HIGH (ref 11.5–15.5)
WBC: 5 10*3/uL (ref 4.0–10.5)
nRBC: 0 % (ref 0.0–0.2)

## 2022-04-23 LAB — OSMOLALITY: Osmolality: 288 mOsm/kg (ref 275–295)

## 2022-04-23 LAB — COMPREHENSIVE METABOLIC PANEL
ALT: 48 U/L — ABNORMAL HIGH (ref 0–44)
AST: 54 U/L — ABNORMAL HIGH (ref 15–41)
Albumin: 1.5 g/dL — ABNORMAL LOW (ref 3.5–5.0)
Alkaline Phosphatase: 54 U/L (ref 38–126)
Anion gap: 3 — ABNORMAL LOW (ref 5–15)
BUN: 12 mg/dL (ref 8–23)
CO2: 23 mmol/L (ref 22–32)
Calcium: 8.4 mg/dL — ABNORMAL LOW (ref 8.9–10.3)
Chloride: 105 mmol/L (ref 98–111)
Creatinine, Ser: 0.81 mg/dL (ref 0.61–1.24)
GFR, Estimated: >60 mL/min (ref >60)
Glucose, Bld: 136 mg/dL — ABNORMAL HIGH (ref 70–99)
Potassium: 4.3 mmol/L (ref 3.5–5.1)
Sodium: 131 mmol/L — ABNORMAL LOW (ref 135–145)
Total Bilirubin: 0.5 mg/dL (ref 0.3–1.2)
Total Protein: 5.7 g/dL — ABNORMAL LOW (ref 6.5–8.1)

## 2022-04-23 LAB — GLUCOSE, CAPILLARY
Glucose-Capillary: 97 mg/dL (ref 70–99)
Glucose-Capillary: 121 mg/dL — ABNORMAL HIGH (ref 70–99)
Glucose-Capillary: 171 mg/dL — ABNORMAL HIGH (ref 70–99)
Glucose-Capillary: 164 mg/dL — ABNORMAL HIGH (ref 70–99)

## 2022-04-23 LAB — URIC ACID: Uric Acid, Serum: 5.2 mg/dL (ref 3.7–8.6)

## 2022-04-23 LAB — OSMOLALITY, URINE: Osmolality, Ur: 461 mOsm/kg (ref 300–900)

## 2022-04-23 LAB — MAGNESIUM: Magnesium: 1.8 mg/dL (ref 1.7–2.4)

## 2022-04-23 LAB — CREATININE, URINE, RANDOM: Creatinine, Urine: 59.74 mg/dL

## 2022-04-23 LAB — SODIUM, URINE, RANDOM: Sodium, Ur: 130 mmol/L

## 2022-04-23 MED ORDER — MORPHINE SULFATE (PF) 2 MG/ML IV SOLN
2.0000 mg | Freq: Every day | INTRAVENOUS | Status: DC | PRN
  Administered 2022-04-24 – 2022-05-10 (×16): 2 mg via INTRAVENOUS
  Filled 2022-04-23 (×18): qty 1

## 2022-04-23 MED ORDER — LACTATED RINGERS IV SOLN: INTRAVENOUS | Status: AC

## 2022-04-23 NOTE — Progress Notes (Signed)
Physical Therapy Wound Treatment Patient Details  Name: Christian [name] MRN: [id number] Date of Birth: November 08, 1951  Today's Date: 04/23/2022 Time: 1007-1105 Time Calculation (min): 58 min  Subjective  Subjective Assessment Subjective: Pt stating "ouch" repeatedly during session. Patient and Family Stated Goals: None stated Date of Onset:  (Unknown) Prior Treatments: Dressing changes  Pain Score:  Premedicated shortly before starting hydrotherapy, poor pain tolerance   Wound Assessment  Pressure Injury 03/31/22 Sacrum Mid Unstageable - Full thickness tissue loss in which the base of the injury is covered by slough (yellow, tan, gray, green or brown) and/or eschar (tan, brown or black) in the wound bed. (Active)  Wound Image   04/23/22 1339  Dressing Type Foam - Lift dressing to assess site every shift;Gauze (Comment);Barrier Film (skin prep);Moist to moist;Santyl 04/23/22 1339  Dressing Clean, Dry, Intact;Changed 04/23/22 1339  Dressing Change Frequency Twice a day 04/23/22 1339  State of Healing Eschar 04/23/22 1339  Site / Wound Assessment Painful;Yellow;Pink;Red;Black;Brown 04/23/22 1339  % Wound base Red or Granulating 70% 04/23/22 1339  % Wound base Yellow/Fibrinous Exudate 20% 04/23/22 1339  % Wound base Black/Eschar 10% 04/23/22 1339  % Wound base Other/Granulation Tissue (Comment) 0% 04/23/22 1339  Peri-wound Assessment Purple;Pink 04/23/22 1339  Wound Length (cm) 9.5 cm 04/23/22 1339  Wound Width (cm) 9.5 cm 04/23/22 1339  Wound Depth (cm) 3.25 cm 04/23/22 1339  Wound Surface Area (cm^2) 90.25 cm^2 04/23/22 1339  Wound Volume (cm^3) 293.31 cm^3 04/23/22 1339  Tunneling (cm) 7.7 at ~7:00 04/23/22 1339  Undermining (cm) 2 from 7:00-8:00, 3 at 9:00, 2.5 from 11:00-12:00 04/23/22 1339  Margins Unattached edges (unapproximated) 04/23/22 1339  Drainage Amount Moderate 04/23/22 1339  Drainage Description Serosanguineous 04/23/22 1339  Treatment Debridement  (Selective);Irrigation;Other (Comment) 04/23/22 1339   Hydrotherapy Pulsed lavage therapy - wound location: Sacrum Pulsed Lavage with Suction (psi): 4 psi (started with 8-12 psi but pt reported pain, thus reduced to 4) Pulsed Lavage with Suction - Normal Saline Used: 1000 mL Pulsed Lavage Tip: Tip with splash shield Selective Debridement Selective Debridement - Location: Sacrum Selective Debridement - Tools Used: Forceps, Scalpel, Scissors Selective Debridement - Tissue Removed: Black and yellow nectrotic tissue    Wound Assessment and Plan  Wound Therapy - Assess/Plan/Recommendations Wound Therapy - Clinical Statement: The pt's wound continues to make gradual progress with decreased necrotic tissue. Despite pt receiving pain meds, he continued to state "ouch" repeatedly throughout the session. Secondary to his poor pain tolerance, decided to perform irrigation rather than pulsed lavage. Contacted MD to request increased pain meds for hydrotherapy sessions. This patient will benefit from continued hydrotherapy for selective removal of unviable tissue, to decrease bioburden, and promote wound bed healing. Wound Therapy - Functional Problem List: Global weakness; apparently had not been out of bed since he got back to ALF from SNF ~1 week (PTA). Factors Delaying/Impairing Wound Healing: Diabetes Mellitus, Incontinence, Infection - systemic/local, Immobility, Multiple medical problems Hydrotherapy Plan: Debridement, Dressing change, Patient/family education, Pulsatile lavage with suction Wound Therapy - Frequency: 3X / week (M, W, F) Wound Therapy - Follow Up Recommendations: dressing changes by RN  Wound Therapy Goals- Improve the function of patient's integumentary system by progressing the wound(s) through the phases of wound healing (inflammation - proliferation - remodeling) by: Wound Therapy Goals - Improve the function of patient's integumentary system by progressing the wound(s) through  the phases of wound healing by: Decrease Necrotic Tissue to: 20% Decrease Necrotic Tissue - Progress: Progressing toward goal Increase Granulation Tissue to:  80% Increase Granulation Tissue - Progress: Progressing toward goal Improve Drainage Characteristics: Min, Serous Improve Drainage Characteristics - Progress: Progressing toward goal Goals/treatment plan/discharge plan were made with and agreed upon by patient/family: Yes Time For Goal Achievement: 7 days Wound Therapy - Potential for Goals: Fair  Goals will be updated until maximal potential achieved or discharge criteria met.  Discharge criteria: when goals achieved, discharge from hospital, MD decision/surgical intervention, no progress towards goals, refusal/missing three consecutive treatments without notification or medical reason.  GP     Charges PT Wound Care Charges $Wound Debridement up to 20 cm: < or equal to 20 cm $ Wound Debridement each add'l 20 sqcm: 1 $PT Hydrotherapy Dressing: 2 dressings $PT Hydrotherapy Visit: 1 Visit      Moishe Spice, PT, DPT Acute Rehabilitation Services  Pager: [phone] Office: [place] 04/23/2022, 1:51 PM

## 2022-04-23 NOTE — Progress Notes (Signed)
Occupational Therapy Treatment Patient Details Name: Christian [name] MRN: [id number] DOB: Apr 05, 1951 Today's Date: 04/23/2022   History of present illness 71 y.o. male presented 5/2 with worsening strength , new decubitus ulcers and poor intake.  Recent admission for cellulitis and hypoglycemia, had just returned to ALF from SNF a week ago but not OOB since then.  Has new hypoglyemia, elevation of K+, creatinine, CK.  Ulcer is not infected but has LE skin is also irritated and has skin breakdown. LEFt UE swollen and Doppler ordered 5/15 to r/o DVT.  PMHx:  chronic venous stasis and chronic right shin unhealing wound, chronic HFpEF, IDDM, CAD with stenting x2 in 2021, PVD, CKD stage IIIa, HTN, DVT on Xarelto, bipolar disorder, OSA on CPAP at bedtime   OT comments  Focus of session on B UE AAROM, bed mobility and sitting balance at EOB. Pt continues to be reliant on +2 assist for all mobility. Tolerated sitting x 5-7 minutes holding foot board. Requesting to return supine, reporting mild dizziness, discomfort. Improved edema in R UE and ability to FF shoulder.    Recommendations for follow up therapy are one component of a multi-disciplinary discharge planning process, led by the attending physician.  Recommendations may be updated based on patient status, additional functional criteria and insurance authorization.    Follow Up Recommendations  Skilled nursing-short term rehab (<3 hours/day)    Assistance Recommended at Discharge Frequent or constant Supervision/Assistance  Patient can return home with the following  Two people to help with walking and/or transfers;Two people to help with bathing/dressing/bathroom;Assistance with feeding;Assistance with cooking/housework;Direct supervision/assist for medications management;Direct supervision/assist for financial management;Assist for transportation;Help with stairs or ramp for entrance   Equipment Recommendations  Other (comment) (defer to next  venue)    Recommendations for Other Services      Precautions / Restrictions Precautions Precautions: Fall Restrictions Weight Bearing Restrictions: No       Mobility Bed Mobility Overal bed mobility: Needs Assistance Bed Mobility: Rolling, Supine to Sit, Sit to Supine Rolling: +2 for physical assistance, Max assist   Supine to sit: +2 for physical assistance, Total assist Sit to supine: +2 for physical assistance, Max assist   General bed mobility comments: worked on truncal segmentation rolling L and right prior to assisting pt to EOB, pt initiating reaching for rail, LEs over EOB, needing total assist to raise trunk, guided trunk and assisted LEs back into bed, positioned pt on R side with wedge    Transfers                         Balance Overall balance assessment: Needs assistance, History of Falls Sitting-balance support: Feet supported, Single extremity supported Sitting balance-Leahy Scale: Poor Sitting balance - Comments: tolerated x 5-7 minutes before request to return to supine, reports mild dizziness, BP monitored                                   ADL either performed or assessed with clinical judgement   ADL Overall ADL's : Needs assistance/impaired         Upper Body Bathing: Total assistance;Sitting Upper Body Bathing Details (indicate cue type and reason): back         Lower Body Dressing: Total assistance;Bed level Lower Body Dressing Details (indicate cue type and reason): socks  Extremity/Trunk Assessment Upper Extremity Assessment RUE Deficits / Details: performed AAROM shoulder, less edematous, + crepitus LUE Deficits / Details: AAROM shoulder, AROM elbow, remains edematous, moved BP cuff to R UE            Vision       Perception     Praxis      Cognition Arousal/Alertness: Awake/alert Behavior During Therapy: Flat affect Overall Cognitive Status: No family/caregiver present  to determine baseline cognitive functioning                                 General Comments: Follows 1 step commands with increased  time. Slow processing. Pt minimally verbalizing today.        Exercises      Shoulder Instructions       General Comments      Pertinent Vitals/ Pain       Pain Assessment Pain Assessment: Faces Faces Pain Scale: Hurts even more Pain Location: sacrum, B feet Pain Descriptors / Indicators: Grimacing, Guarding Pain Intervention(s): Monitored during session, Repositioned  Home Living                                          Prior Functioning/Environment              Frequency  Min 2X/week        Progress Toward Goals  OT Goals(current goals can now be found in the care plan section)  Progress towards OT goals: Progressing toward goals  Acute Rehab OT Goals OT Goal Formulation: With patient Time For Goal Achievement: 05/05/22 Potential to Achieve Goals: [place] Discharge plan remains appropriate    Co-evaluation    PT/OT/SLP Co-Evaluation/Treatment: Yes Reason for Co-Treatment: Complexity of the patient's impairments (multi-system involvement)   OT goals addressed during session: Strengthening/ROM      AM-PAC OT "6 Clicks" Daily Activity     Outcome Measure   Help from another person eating meals?: Total Help from another person taking care of personal grooming?: Total Help from another person toileting, which includes using toliet, bedpan, or urinal?: Total Help from another person bathing (including washing, rinsing, drying)?: Total Help from another person to put on and taking off regular upper body clothing?: Total Help from another person to put on and taking off regular lower body clothing?: Total 6 Click Score: 6    End of Session    OT Visit Diagnosis: Muscle weakness (generalized) (M62.81);Pain   Activity Tolerance     Patient Left in bed;with call bell/phone within  reach   Nurse Communication          Time: 0836-0900 OT Time Calculation (min): 24 min  Charges: OT General Charges $OT Visit: 1 Visit OT Treatments $Therapeutic Activity: 8-22 mins  Nestor Lewandowsky, OTR/L Acute Rehabilitation Services Pager: [phone] Office: [phone]  Malka So 04/23/2022, 9:35 AM

## 2022-04-23 NOTE — Progress Notes (Signed)
   Palliative Medicine Inpatient Follow Up Note   Chart reviewed.  Sent a secure chat to Dr. Thedore Mins to inform him that I will be on shift throughout the day for Richrd Humbles as she is off today.    Dr. Thedore Mins shares that there are no needs from a palliative perspective presently.  No charge ______________________________________________________________________________________ Lamarr Lulas [facility] Palliative Medicine Team Team Cell Phone: [phone] Please utilize secure chat with additional questions, if there is no response within 30 minutes please call the above phone number  Palliative Medicine Team providers are available by phone from 7am to 7pm daily and can be reached through the team cell phone.  Should this patient require assistance outside of these hours, please call the patient's attending physician.

## 2022-04-23 NOTE — Progress Notes (Addendum)
PROGRESS NOTE        PATIENT DETAILS Name: Christian [name] Age: 71 y.o. Sex: male Date of Birth: 1951/07/17 Admit Date: 03/30/2022 Admitting Physician Rise Patience, MD PCP:Sun, Gari Crown, MD  Brief Summary: Patient is a 71 y.o.  male with a history of A-fib on Xarelto, HFpEF, PAD, DM-2, bipolar disorder-presented with generalized weakness/sacral decubitus ulcer-was found to have hypoglycemia, hyperkalemia and subsequently admitted to the hospitalist service-see below for further details.  Thought to have extrapyramidal symptoms-evaluated by psych/neurology-psych medications adjusted with clinical improvement.  Unfortunately-further hospital course complicated by development of fever-due to probable Streptococcus bacteremia and infected decubitus ulcer.  See below for further details.  Significant events: 5/2>> admit to [facility] for weakness/hypoglycemia/hyperkalemia/rigidity-thought to have EPS 5/14>> febrile/tachycardic-Per nursing staff-discharge from prior existing sacral decubitus ulcer.  Started on broad-spectrum IV antibiotics.  Prelim blood culture positive for Streptococcus species. 5/19>> wound care starting hydrotherapy   Significant studies: 5/3>> x-ray pelvis: No fracture. 5/3>> CT head: No acute intracranial findings. 5/3>> x-ray left shoulder: Rotator cuff impingement 5/14>> CXR:No Obvious PNA 5/15>> left upper extremity Doppler: No DVT. 5/16>> CT abdomen/pelvis: Inferior sacral decubitus ulceration-no evidence of osteomyelitis. 5/16>> Echo: EF 45-50%, no obvious vegetations.  Significant microbiology data: 5/14>> blood culture: Streptococcus constellatus  5/15>> blood culture: No growth  Procedures:   Consults:  Neurology, psychiatry, infectious disease, CCS, Pall care  Subjective: Patient in bed, appears comfortable, denies any headache, no fever, no chest pain or pressure, no shortness of breath , no abdominal pain. No new focal  weakness.   Objective: Vitals: Blood pressure (!) 127/97, pulse 70, temperature 98 F (36.7 C), temperature source Oral, resp. rate 19, height 6' (1.829 m), weight 109.8 kg, SpO2 91 %.   Exam:  Awake Alert, No new F.N deficits but chronically bedbound, contracture boots in both legs, severe chronic generalized weakness [place].AT,PERRAL Supple Neck, No JVD,   Symmetrical Chest wall movement, Good air movement bilaterally, CTAB RRR,No Gallops, Rubs or new Murmurs,  +ve B.Sounds, Abd Soft, No tenderness,   No Cyanosis, Clubbing or edema     Assessment/Plan:  Rigidity/tremors/mildly elevated CK: Felt to be more of extrapyramidal syndrome rather than NMS at this point.  Managed with supportive care.  After neurology/psych evaluation-has been resumed on Abilify and Cogentin-Wellbutrin & Seroquel have been discontinued.  Sepsis due to possible strep bacteremia-sacral decubitus ulcer with infection and underlying sacral abscess: Sepsis physiology improving-continues to have low-grade fever-once afebrile switch from Dapto to Zyvox (end date 5/31) and continue Augmentin until 5/24.  CT abdomen on 5/16 without any sacral osteomyelitis-TTE without vegetation.  Wound care following, had evidence of self opening of her sacral abscess with drainage on 04/17/2022 during wound care session, general surgery consulted, they have seen the patient no surgical intervention at this time per general surgery will continue wound care.  Patient currently is requiring hydrotherapy and complex wound care will benefit from LTAC placement rather than SNF.    Case discussed with ID repeat CT done on 04/19/2022 is nonacute, TEE done on 04/21/22 was -ve.  Repeat cultures drawn on 04/12/2022 are negative.  Final ID recommendations noted with antibiotics ending on 04/25/2022.   Acute metabolic encephalopathy: Due to sepsis--has significant amount of debility/deconditioning at baseline.    AKI on CKD stage IIIa: AKI likely  hemodynamically mediated-improved-creatinine close to baseline.   Mild rhabdomyolysis: Nontraumatic-improved with  supportive care.  Hypokalemia: Replaced  Hypoglycemia: Resolved with supportive care.  Do not resume Amaryl on discharge.  Hypothyroidism: Continue Cytomel.  PAF: Rate controlled-on Xarelto, added Lopressor as well.  HTN - added Lopressor.  PAD: Follow-up with vascular surgery in the outpatient setting  HFpEF with EF 45 to 50% on recent echocardiogram: Volume status stable-suspect has some amount of edema in lower extremities due to third spacing.  Demadex held due to sepsis physiology-should be able to resume over the next few days.  Bipolar disorder: Given concerns for EPS-on Abilify and Cogentin-Depakote/Wellbutrin and Seroquel held.  Left shoulder pain: Supportive care-likely rotator cuff injury.  Stable to be followed up by orthopedics in the outpatient setting  Left arm swelling: Likely due to IVF infiltration-could have superficial venous thrombosis-left upper extremity Doppler negative for DVT.  Supportive local care at this point.  Note patient is already on full dose anticoagulation for his A-fib.  OSA: CPAP nightly  DM-2 (A1c 7.6 on 4/15): CBG relatively stable-avoiding tight glycemic control-at risk for hypoglycemia-continue Semglee 10 units and SSI.  Recent Labs    04/22/22 1309 04/22/22 1658 04/22/22 2020  GLUCAP 97 140* 178*     Debility/deconditioning: Has had worsening of his chronic debility during this hospitalization-plans are of SNF on discharge.  Per family-he has been nonambulatory since his discharge from [facility] in April 2023.  This likely resulted in decubitus ulcer that the patient presented with.  Nutrition Status: Nutrition Problem: Increased nutrient needs Etiology: chronic illness, wound healing Signs/Symptoms: estimated needs Interventions: Ensure Enlive (each supplement provides 350kcal and 20 grams of protein), MVI, Juven,  Other (Comment) (Feeding assist)  Pressure Ulcer: Pressure Injury 03/31/22 Sacrum Mid Unstageable - Full thickness tissue loss in which the base of the injury is covered by slough (yellow, tan, gray, green or brown) and/or eschar (tan, brown or black) in the wound bed. (Active)  03/31/22 1933  Location: Sacrum  Location Orientation: Mid  Staging: Unstageable - Full thickness tissue loss in which the base of the injury is covered by slough (yellow, tan, gray, green or brown) and/or eschar (tan, brown or black) in the wound bed.  Wound Description (Comments):   Present on Admission: Yes  Dressing Type Foam - Lift dressing to assess site every shift;Dakin's-soaked gauze 04/23/22 0400   Obesity: Estimated body mass index is 32.83 kg/m as calculated from the following:   Height as of this encounter: 6' (1.829 m).   Weight as of this encounter: 109.8 kg.   Code status:   Code Status: Full Code   DVT Prophylaxis: rivaroxaban (XARELTO) tablet 20 mg    Family Communication: Daughter-[name]-[phone]-updated on 5/18/ 23 by previous MD.   Disposition Plan: Status is: Inpatient Remains inpatient appropriate because: Not stable for discharge-resolving sepsis physiology due to streptococcal bacteremia/infected sacral decubitus ulcer-continues to have low-grade fever-on IV daptomycin/Augmentin.  Hydrotherapy to be started on 5/19.   Planned Discharge Destination:Skilled nursing facility   Diet: Diet Order             DIET DYS 2 Room service appropriate? Yes; Fluid consistency: Thin  Diet effective now                   Antimicrobial agents: MEDICATIONS: Scheduled Meds:  amoxicillin-clavulanate  1 tablet Oral Q12H   ARIPiprazole  10 mg Oral q morning   benztropine  1 mg Oral BID   Chlorhexidine Gluconate Cloth  6 each Topical Daily   collagenase   Topical Daily  diclofenac Sodium  2 g Topical QID   feeding supplement  237 mL Oral BID BM   insulin aspart  0-9  Units Subcutaneous TID WC   insulin glargine-yfgn  10 Units Subcutaneous Daily   linezolid  600 mg Oral Q12H   liothyronine  10 mcg Oral Daily   mouth rinse  15 mL Mouth Rinse BID   metoprolol tartrate  50 mg Oral BID   multivitamin with minerals  1 tablet Oral Daily   nutrition supplement (JUVEN)  1 packet Oral BID BM   rivaroxaban  20 mg Oral Q supper   senna-docusate  2 tablet Oral BID   Continuous Infusions:  lactated ringers 100 mL/hr at 04/23/22 0703   PRN Meds:.acetaminophen **OR** acetaminophen, bisacodyl, HYDROcodone-acetaminophen, melatonin   I have personally reviewed following labs and imaging studies  LABORATORY DATA:  Recent Labs  Lab 04/19/22 0210 04/20/22 0245 04/21/22 0114 04/22/22 0359 04/23/22 0128  WBC 6.5 5.6 5.4 4.5 5.0  HGB 10.5* 12.2* 10.2* 10.7* 11.3*  HCT 33.3* 38.3* 32.9* 33.2* 36.2*  PLT 293 293 293 320 302  MCV 85.2 84.7 85.9 84.3 85.4  MCH 26.9 27.0 26.6 27.2 26.7  MCHC 31.5 31.9 31.0 32.2 31.2  RDW 17.6* 17.4* 17.2* 17.0* 17.0*  LYMPHSABS 1.0 1.4 1.3 1.3 1.3  MONOABS 0.4 0.3 0.4 0.2 0.3  EOSABS 0.2 0.1 0.1 0.1 0.1  BASOSABS 0.0 0.0 0.0 0.0 0.0    Recent Labs  Lab 04/18/22 0223 04/18/22 0918 04/19/22 0210 04/20/22 0245 04/21/22 0114 04/22/22 0359 04/23/22 0128  NA 144  --  143 140 136 138 131*  K 3.0*  --  3.5 3.6 4.1 3.5 4.3  CL 118*  --  116* 113* 110 110 105  CO2 22  --  22 21* 23 25 23   GLUCOSE 94  --  122* 89 150* 115* 136*  BUN 27*  --  23 17 16 14 12   CREATININE 1.11  --  1.00 0.93 0.85 0.75 0.81  CALCIUM 9.0  --  8.9 8.8* 8.5* 8.6* 8.4*  AST 30  --  30 37 71* 63* 54*  ALT 24  --  24 28 44 50* 48*  ALKPHOS 55  --  58 62 54 55 54  BILITOT 0.5  --  0.6 0.8 0.4 0.3 0.5  ALBUMIN <1.5*  --  1.5* 1.6* <1.5* 1.5* 1.5*  MG 2.0  --  1.8 1.8 1.8 1.7 1.8  CRP 17.9*  --  13.9* 10.1* 6.6* 4.4*  --   PROCALCITON 0.24  --  0.11 0.12 0.11 <0.10  --   AMMONIA  --  23  --   --   --   --   --   BNP 360.3*  --  596.1* 635.0* 361.2*  552.3*  --    RADIOLOGY STUDIES/RESULTS: ECHO TEE  Result Date: 04/21/2022    TRANSESOPHOGEAL ECHO REPORT   Patient Name:   BERNARDINO MCTIERNAN Date of Exam: 04/21/2022 Medical Rec #:  [id number]        Height:       72.0 in Accession #:    [id number]       Weight:       242.1 lb Date of Birth:  Oct 13, 1951        BSA:          2.311 m Patient Age:    89 years         BP:  143/84 mmHg Patient Gender: M                HR:           66 bpm. Exam Location:  Inpatient Procedure: 3D Echo, Transesophageal Echo, Cardiac Doppler and Color Doppler Indications:     Bacteremia  History:         Patient has prior history of Echocardiogram examinations, most                  recent 04/13/2022. Previous Myocardial Infarction, Abnormal ECG,                  Arrythmias:Atrial Flutter, Signs/Symptoms:Altered Mental                  Status, Syncope, Shortness of Breath and Dyspnea; Risk                  Factors:Sleep Apnea, Dyslipidemia and Hypertension. Shock. CKD.  Sonographer:     Roseanna Rainbow RDCS Referring Phys:  [id number] Margie Billet Diagnosing Phys: [name] MD PROCEDURE: After discussion of the risks and benefits of a TEE, an informed consent was obtained from the patient. The transesophogeal probe was passed without difficulty through the esophogus of the patient. Imaged were obtained with the patient in a left lateral decubitus position. Sedation performed by different physician. The patient was monitored while under deep sedation. Anesthestetic sedation was provided intravenously by Anesthesiology: 373mg  of Propofol, 100mg  of Lidocaine. The patient's vital signs; including heart rate, blood pressure, and oxygen saturation; remained stable throughout the procedure. The patient developed no complications during the procedure. IMPRESSIONS  1. Left ventricular ejection fraction, by estimation, is 50 to 55%. The left ventricle has low normal function.  2. Right ventricular systolic function is normal. The  right ventricular size is normal.  3. Left atrial size was mildly dilated. No left atrial/left atrial appendage thrombus was detected.  4. The mitral valve is degenerative. Trivial mitral valve regurgitation.  5. The aortic valve is calcified. There is moderate calcification of the aortic valve. Aortic valve regurgitation is not visualized. Aortic valve sclerosis/calcification is present, without any evidence of aortic stenosis. Conclusion(s)/Recommendation(s): No evidence of vegetation/infective endocarditis on this transesophageael echocardiogram. FINDINGS  Left Ventricle: Left ventricular ejection fraction, by estimation, is 50 to 55%. The left ventricle has low normal function. The left ventricular internal cavity size was normal in size. Right Ventricle: The right ventricular size is normal. No increase in right ventricular wall thickness. Right ventricular systolic function is normal. Left Atrium: Left atrial size was mildly dilated. No left atrial/left atrial appendage thrombus was detected. Right Atrium: Right atrial size was normal in size. Pericardium: Trivial pericardial effusion is present. Mitral Valve: The mitral valve is degenerative in appearance. Trivial mitral valve regurgitation. Tricuspid Valve: The tricuspid valve is normal in structure. Tricuspid valve regurgitation is trivial. Aortic Valve: The aortic valve is calcified. There is moderate calcification of the aortic valve. Aortic valve regurgitation is not visualized. Aortic valve sclerosis/calcification is present, without any evidence of aortic stenosis. Pulmonic Valve: The pulmonic valve was normal in structure. Pulmonic valve regurgitation is not visualized. Aorta: The aortic root is normal in size and structure. IAS/Shunts: No atrial level shunt detected by color flow Doppler.   3D Volume EF LV 3D EDV:   119.81 ml LV 3D ESV:   61.66 ml [name] MD Electronically signed by [name] MD Signature Date/Time:  04/21/2022/3:19:22 PM    Final  LOS: 23 days   Signature  Lala Lund M.D on 04/23/2022 at 9:52 AM   -  To page go to [email or web address]

## 2022-04-23 NOTE — Progress Notes (Signed)
Physical Therapy Treatment Patient Details Name: Christian [name] MRN: [id number] DOB: Sep 16, 1951 Today's Date: 04/23/2022   History of Present Illness 71 y.o. male presented 5/2 with worsening strength , new decubitus ulcers and poor intake.  Recent admission for cellulitis and hypoglycemia, had just returned to ALF from SNF a week ago but not OOB since then.  Has new hypoglyemia, elevation of K+, creatinine, CK.  Ulcer is not infected but has LE skin is also irritated and has skin breakdown. LEFt UE swollen and Doppler ordered 5/15 to r/o DVT.  PMHx:  chronic venous stasis and chronic right shin unhealing wound, chronic HFpEF, IDDM, CAD with stenting x2 in 2021, PVD, CKD stage IIIa, HTN, DVT on Xarelto, bipolar disorder, OSA on CPAP at bedtime    PT Comments    Pt received in bed in L sidelying. Segmental rolling and LE ROM to assist with decreasing stiffness prior to mobilizing to EOB. +2 max assist rolling, +2 total assist supine to sit, and +2 max assist sit to supine. Heavy push posteriorly with initiation of supine to sit. Able to progress to sitting EOB with increased time. Pt sat EOB x 5-7 minutes, limited by pain. Demonstrates poor sitting balance, with R hand holding footboard for support. Pt positioned with wedge into R sidelying at end of session.    Recommendations for follow up therapy are one component of a multi-disciplinary discharge planning process, led by the attending physician.  Recommendations may be updated based on patient status, additional functional criteria and insurance authorization.  Follow Up Recommendations  Skilled nursing-short term rehab (<3 hours/day)     Assistance Recommended at Discharge Frequent or constant Supervision/Assistance  Patient can return home with the following Two people to help with walking and/or transfers;Assistance with cooking/housework;Assist for transportation;Help with stairs or ramp for entrance;Two people to help with  bathing/dressing/bathroom;Assistance with feeding;Direct supervision/assist for medications management;Direct supervision/assist for financial management   Equipment Recommendations       Recommendations for Other Services       Precautions / Restrictions Precautions Precautions: Fall;Other (comment) Precaution Comments: monitor vitals Restrictions Weight Bearing Restrictions: No     Mobility  Bed Mobility Overal bed mobility: Needs Assistance Bed Mobility: Rolling, Supine to Sit, Sit to Supine Rolling: +2 for physical assistance, Max assist   Supine to sit: +2 for physical assistance, Total assist Sit to supine: +2 for physical assistance, Max assist   General bed mobility comments: worked on truncal segmentation rolling L and right prior to assisting pt to EOB, pt initiating reaching for rail, LEs over EOB, needing total assist to raise trunk, guided trunk and assisted LEs back into bed, positioned pt on R side with wedge    Transfers                   General transfer comment: unable    Ambulation/Gait                   Stairs             Wheelchair Mobility    Modified Rankin (Stroke Patients Only)       Balance Overall balance assessment: Needs assistance, History of Falls Sitting-balance support: Feet supported, Single extremity supported Sitting balance-Leahy Scale: Poor Sitting balance - Comments: tolerated x 5-7 minutes before request to return to supine, reports mild dizziness, BP monitored  Cognition Arousal/Alertness: Awake/alert Behavior During Therapy: Flat affect Overall Cognitive Status: No family/caregiver present to determine baseline cognitive functioning                                 General Comments: Follows 1 step commands with increased  time. Slow processing. Pt minimally verbalizing today.        Exercises      General Comments         Pertinent Vitals/Pain Pain Assessment Pain Assessment: Faces Faces Pain Scale: Hurts even more Pain Location: sacrum, B feet Pain Descriptors / Indicators: Grimacing, Guarding Pain Intervention(s): Monitored during session, Repositioned, Limited activity within patient's tolerance    Home Living                          Prior Function            PT Goals (current goals can now be found in the care plan section) Acute Rehab PT Goals Patient Stated Goal: not stated Progress towards PT goals: Progressing toward goals    Frequency    Min 2X/week      PT Plan Current plan remains appropriate    Co-evaluation PT/OT/SLP Co-Evaluation/Treatment: Yes Reason for Co-Treatment: Complexity of the patient's impairments (multi-system involvement) PT goals addressed during session: Mobility/safety with mobility;Balance OT goals addressed during session: Strengthening/ROM      AM-PAC PT "6 Clicks" Mobility   Outcome Measure  Help needed turning from your back to your side while in a flat bed without using bedrails?: Total Help needed moving from lying on your back to sitting on the side of a flat bed without using bedrails?: Total Help needed moving to and from a bed to a chair (including a wheelchair)?: Total Help needed standing up from a chair using your arms (e.g., wheelchair or bedside chair)?: Total Help needed to walk in hospital room?: Total Help needed climbing 3-5 steps with a railing? : Total 6 Click Score: 6    End of Session   Activity Tolerance: Patient limited by pain Patient left: in bed;with call bell/phone within reach Nurse Communication: Mobility status PT Visit Diagnosis: Unsteadiness on feet (R26.81);History of falling (Z91.81);Repeated falls (R29.6);Difficulty in walking, not elsewhere classified (R26.2)     Time: [id number] PT Time Calculation (min) (ACUTE ONLY): 23 min  Charges:  $Therapeutic Activity: 8-22 mins                      Lorrin Goodell, PT  Office # [phone] Pager [phone]    Lorriane Shire 04/23/2022, 11:23 AM

## 2022-04-24 DIAGNOSIS — R627 Adult failure to thrive: Secondary | ICD-10-CM | POA: Diagnosis not present

## 2022-04-24 DIAGNOSIS — E119 Type 2 diabetes mellitus without complications: Secondary | ICD-10-CM | POA: Diagnosis not present

## 2022-04-24 DIAGNOSIS — R7881 Bacteremia: Secondary | ICD-10-CM | POA: Diagnosis not present

## 2022-04-24 DIAGNOSIS — I739 Peripheral vascular disease, unspecified: Secondary | ICD-10-CM

## 2022-04-24 DIAGNOSIS — E875 Hyperkalemia: Secondary | ICD-10-CM | POA: Diagnosis not present

## 2022-04-24 DIAGNOSIS — Z7189 Other specified counseling: Secondary | ICD-10-CM

## 2022-04-24 DIAGNOSIS — R6520 Severe sepsis without septic shock: Secondary | ICD-10-CM | POA: Diagnosis not present

## 2022-04-24 DIAGNOSIS — A419 Sepsis, unspecified organism: Secondary | ICD-10-CM | POA: Diagnosis not present

## 2022-04-24 LAB — CBC WITH DIFFERENTIAL/PLATELET
Abs Immature Granulocytes: 0.03 10*3/uL (ref 0.00–0.07)
Basophils Absolute: 0 10*3/uL (ref 0.0–0.1)
Basophils Relative: 0 %
Eosinophils Absolute: 0.1 10*3/uL (ref 0.0–0.5)
Eosinophils Relative: 3 %
HCT: 37.2 % — ABNORMAL LOW (ref 39.0–52.0)
Hemoglobin: 11.6 g/dL — ABNORMAL LOW (ref 13.0–17.0)
Immature Granulocytes: 1 %
Lymphocytes Relative: 26 %
Lymphs Abs: 1.1 10*3/uL (ref 0.7–4.0)
MCH: 26.7 pg (ref 26.0–34.0)
MCHC: 31.2 g/dL (ref 30.0–36.0)
MCV: 85.7 fL (ref 80.0–100.0)
Monocytes Absolute: 0.3 10*3/uL (ref 0.1–1.0)
Monocytes Relative: 8 %
Neutro Abs: 2.7 10*3/uL (ref 1.7–7.7)
Neutrophils Relative %: 62 %
Platelets: 301 10*3/uL (ref 150–400)
RBC: 4.34 MIL/uL (ref 4.22–5.81)
RDW: 17.2 % — ABNORMAL HIGH (ref 11.5–15.5)
WBC: 4.3 10*3/uL (ref 4.0–10.5)
nRBC: 0 % (ref 0.0–0.2)

## 2022-04-24 LAB — COMPREHENSIVE METABOLIC PANEL
ALT: 43 U/L (ref 0–44)
AST: 48 U/L — ABNORMAL HIGH (ref 15–41)
Albumin: 1.7 g/dL — ABNORMAL LOW (ref 3.5–5.0)
Alkaline Phosphatase: 63 U/L (ref 38–126)
Anion gap: 5 (ref 5–15)
BUN: 12 mg/dL (ref 8–23)
CO2: 23 mmol/L (ref 22–32)
Calcium: 8.7 mg/dL — ABNORMAL LOW (ref 8.9–10.3)
Chloride: 105 mmol/L (ref 98–111)
Creatinine, Ser: 0.83 mg/dL (ref 0.61–1.24)
GFR, Estimated: >60 mL/min (ref >60)
Glucose, Bld: 118 mg/dL — ABNORMAL HIGH (ref 70–99)
Potassium: 4.8 mmol/L (ref 3.5–5.1)
Sodium: 133 mmol/L — ABNORMAL LOW (ref 135–145)
Total Bilirubin: 0.5 mg/dL (ref 0.3–1.2)
Total Protein: 5.8 g/dL — ABNORMAL LOW (ref 6.5–8.1)

## 2022-04-24 LAB — MAGNESIUM: Magnesium: 1.7 mg/dL (ref 1.7–2.4)

## 2022-04-24 LAB — GLUCOSE, CAPILLARY
Glucose-Capillary: 93 mg/dL (ref 70–99)
Glucose-Capillary: 126 mg/dL — ABNORMAL HIGH (ref 70–99)
Glucose-Capillary: 135 mg/dL — ABNORMAL HIGH (ref 70–99)
Glucose-Capillary: 164 mg/dL — ABNORMAL HIGH (ref 70–99)
Glucose-Capillary: 175 mg/dL — ABNORMAL HIGH (ref 70–99)

## 2022-04-24 NOTE — Progress Notes (Signed)
PROGRESS NOTE        PATIENT DETAILS Name: Christian [name] Age: 71 y.o. Sex: male Date of Birth: 09-06-1951 Admit Date: 03/30/2022 Admitting Physician Rise Patience, MD PCP:Sun, Gari Crown, MD  Brief Summary: Patient is a 71 y.o.  male with a history of A-fib on Xarelto, HFpEF, PAD, DM-2, bipolar disorder-presented with generalized weakness/sacral decubitus ulcer-was found to have hypoglycemia, hyperkalemia and subsequently admitted to the hospitalist service-see below for further details.  Thought to have extrapyramidal symptoms-evaluated by psych/neurology-psych medications adjusted with clinical improvement.  Unfortunately-further hospital course complicated by development of fever-due to probable Streptococcus bacteremia and infected decubitus ulcer.  See below for further details.  Significant events: 5/2>> admit to [facility] for weakness/hypoglycemia/hyperkalemia/rigidity-thought to have EPS 5/14>> febrile/tachycardic-Per nursing staff-discharge from prior existing sacral decubitus ulcer.  Started on broad-spectrum IV antibiotics.  Prelim blood culture positive for Streptococcus species. 5/19>> wound care starting hydrotherapy   Significant studies: 5/3>> x-ray pelvis: No fracture. 5/3>> CT head: No acute intracranial findings. 5/3>> x-ray left shoulder: Rotator cuff impingement 5/14>> CXR:No Obvious PNA 5/15>> left upper extremity Doppler: No DVT. 5/16>> CT abdomen/pelvis: Inferior sacral decubitus ulceration-no evidence of osteomyelitis. 5/16>> Echo: EF 45-50%, no obvious vegetations.  Significant microbiology data: 5/14>> blood culture: Streptococcus constellatus  5/15>> blood culture: No growth  Procedures:   Consults:  Neurology, psychiatry, infectious disease, CCS, Pall care  Subjective: Patient in bed, appears comfortable, denies any headache, no fever, no chest pain or pressure, no shortness of breath , no abdominal pain. No new focal  weakness.   Objective: Vitals: Blood pressure 140/83, pulse 69, temperature 98 F (36.7 C), temperature source Oral, resp. rate 19, height 6' (1.829 m), weight 109.8 kg, SpO2 93 %.   Exam:  Awake Alert, No new F.N deficits but chronically bedbound, contracture boots in both legs, severe chronic generalized weakness [place].AT,PERRAL Supple Neck, No JVD,   Symmetrical Chest wall movement, Good air movement bilaterally, CTAB RRR,No Gallops, Rubs or new Murmurs,  +ve B.Sounds, Abd Soft, No tenderness,   No Cyanosis, Clubbing or edema     Assessment/Plan:  Rigidity/tremors/mildly elevated CK: Felt to be more of extrapyramidal syndrome rather than NMS at this point.  Managed with supportive care.  After neurology/psych evaluation-has been resumed on Abilify and Cogentin-Wellbutrin & Seroquel have been discontinued.  Sepsis due to possible strep bacteremia-sacral decubitus ulcer with infection and underlying sacral abscess: Sepsis physiology improving-continues to have low-grade fever-once afebrile switch from Dapto to Zyvox (end date 5/31) and continue Augmentin until 5/24.  CT abdomen on 5/16 without any sacral osteomyelitis-TTE without vegetation.  Wound care following, had evidence of self opening of her sacral abscess with drainage on 04/17/2022 during wound care session, general surgery consulted, they have seen the patient no surgical intervention at this time per general surgery will continue wound care.  Patient currently is requiring hydrotherapy and complex wound care will benefit from LTAC placement rather than SNF.    Case discussed with ID repeat CT done on 04/19/2022 is nonacute, TEE done on 04/21/22 was -ve.  Repeat cultures drawn on 04/12/2022 are negative.  Final ID recommendations noted with antibiotics ending on 04/25/2022.   Acute metabolic encephalopathy: Due to sepsis--has significant amount of debility/deconditioning at baseline.    AKI on CKD stage IIIa: AKI likely  hemodynamically mediated-improved-creatinine close to baseline.   Mild rhabdomyolysis: Nontraumatic-improved with supportive  care.  Hypokalemia: Replaced  Hypoglycemia: Resolved with supportive care.  Do not resume Amaryl on discharge.  Hypothyroidism: Continue Cytomel.  PAF: Rate controlled-on Xarelto, added Lopressor as well.  HTN - added Lopressor.  PAD: Follow-up with vascular surgery in the outpatient setting  HFpEF with EF 45 to 50% on recent echocardiogram: Volume status stable-suspect has some amount of edema in lower extremities due to third spacing.  Demadex held due to sepsis physiology-should be able to resume over the next few days.  Bipolar disorder: Given concerns for EPS-on Abilify and Cogentin-Depakote/Wellbutrin and Seroquel held.  Left shoulder pain: Supportive care-likely rotator cuff injury.  Stable to be followed up by orthopedics in the outpatient setting  Left arm swelling: Likely due to IVF infiltration-could have superficial venous thrombosis-left upper extremity Doppler negative for DVT.  Supportive local care at this point.  Note patient is already on full dose anticoagulation for his A-fib.  OSA: CPAP nightly  DM-2 (A1c 7.6 on 4/15): CBG relatively stable-avoiding tight glycemic control-at risk for hypoglycemia-continue Semglee 10 units and SSI.  Recent Labs    04/23/22 1600 04/23/22 2144 04/24/22 0837  GLUCAP 171* 164* 93     Debility/deconditioning: Has had worsening of his chronic debility during this hospitalization-plans are of SNF on discharge.  Per family-he has been nonambulatory since his discharge from [facility] in April 2023.  This likely resulted in decubitus ulcer that the patient presented with.  Nutrition Status: Nutrition Problem: Increased nutrient needs Etiology: chronic illness, wound healing Signs/Symptoms: estimated needs Interventions: Ensure Enlive (each supplement provides 350kcal and 20 grams of protein), MVI, Juven,  Other (Comment) (Feeding assist)  Pressure Ulcer: Pressure Injury 03/31/22 Sacrum Mid Unstageable - Full thickness tissue loss in which the base of the injury is covered by slough (yellow, tan, gray, green or brown) and/or eschar (tan, brown or black) in the wound bed. (Active)  03/31/22 1933  Location: Sacrum  Location Orientation: Mid  Staging: Unstageable - Full thickness tissue loss in which the base of the injury is covered by slough (yellow, tan, gray, green or brown) and/or eschar (tan, brown or black) in the wound bed.  Wound Description (Comments):   Present on Admission: Yes  Dressing Type Foam - Lift dressing to assess site every shift 04/24/22 0400   Obesity: Estimated body mass index is 32.83 kg/m as calculated from the following:   Height as of this encounter: 6' (1.829 m).   Weight as of this encounter: 109.8 kg.   Code status:   Code Status: Full Code   DVT Prophylaxis: rivaroxaban (XARELTO) tablet 20 mg    Family Communication: Daughter-[name]-[phone]-updated on 5/18/ 23 by previous MD.   Disposition Plan: Status is: Inpatient Remains inpatient appropriate because: Not stable for discharge-resolving sepsis physiology due to streptococcal bacteremia/infected sacral decubitus ulcer-continues to have low-grade fever-on IV daptomycin/Augmentin.  Hydrotherapy to be started on 5/19.   Planned Discharge Destination:Skilled nursing facility   Diet: Diet Order             DIET DYS 2 Room service appropriate? No; Fluid consistency: Thin  Diet effective now                   Antimicrobial agents: MEDICATIONS: Scheduled Meds:  ARIPiprazole  10 mg Oral q morning   benztropine  1 mg Oral BID   Chlorhexidine Gluconate Cloth  6 each Topical Daily   collagenase   Topical Daily   diclofenac Sodium  2 g Topical QID  feeding supplement  237 mL Oral BID BM   insulin aspart  0-9 Units Subcutaneous TID WC   insulin glargine-yfgn  10 Units Subcutaneous  Daily   linezolid  600 mg Oral Q12H   liothyronine  10 mcg Oral Daily   mouth rinse  15 mL Mouth Rinse BID   metoprolol tartrate  50 mg Oral BID   multivitamin with minerals  1 tablet Oral Daily   nutrition supplement (JUVEN)  1 packet Oral BID BM   rivaroxaban  20 mg Oral Q supper   senna-docusate  2 tablet Oral BID   Continuous Infusions:   PRN Meds:.acetaminophen **OR** acetaminophen, bisacodyl, HYDROcodone-acetaminophen, melatonin, morphine injection   I have personally reviewed following labs and imaging studies  LABORATORY DATA:  Recent Labs  Lab 04/20/22 0245 04/21/22 0114 04/22/22 0359 04/23/22 0128 04/24/22 0404  WBC 5.6 5.4 4.5 5.0 4.3  HGB 12.2* 10.2* 10.7* 11.3* 11.6*  HCT 38.3* 32.9* 33.2* 36.2* 37.2*  PLT 293 293 320 302 301  MCV 84.7 85.9 84.3 85.4 85.7  MCH 27.0 26.6 27.2 26.7 26.7  MCHC 31.9 31.0 32.2 31.2 31.2  RDW 17.4* 17.2* 17.0* 17.0* 17.2*  LYMPHSABS 1.4 1.3 1.3 1.3 1.1  MONOABS 0.3 0.4 0.2 0.3 0.3  EOSABS 0.1 0.1 0.1 0.1 0.1  BASOSABS 0.0 0.0 0.0 0.0 0.0    Recent Labs  Lab 04/18/22 0223 04/18/22 0918 04/19/22 0210 04/20/22 0245 04/21/22 0114 04/22/22 0359 04/23/22 0128 04/24/22 0404  NA 144  --  143 140 136 138 131* 133*  K 3.0*  --  3.5 3.6 4.1 3.5 4.3 4.8  CL 118*  --  116* 113* 110 110 105 105  CO2 22  --  22 21* 23 25 23 23   GLUCOSE 94  --  122* 89 150* 115* 136* 118*  BUN 27*  --  23 17 16 14 12 12   CREATININE 1.11  --  1.00 0.93 0.85 0.75 0.81 0.83  CALCIUM 9.0  --  8.9 8.8* 8.5* 8.6* 8.4* 8.7*  AST 30  --  30 37 71* 63* 54* 48*  ALT 24  --  24 28 44 50* 48* 43  ALKPHOS 55  --  58 62 54 55 54 63  BILITOT 0.5  --  0.6 0.8 0.4 0.3 0.5 0.5  ALBUMIN <1.5*  --  1.5* 1.6* <1.5* 1.5* 1.5* 1.7*  MG 2.0  --  1.8 1.8 1.8 1.7 1.8 1.7  CRP 17.9*  --  13.9* 10.1* 6.6* 4.4*  --   --   PROCALCITON 0.24  --  0.11 0.12 0.11 <0.10  --   --   AMMONIA  --  23  --   --   --   --   --   --   BNP 360.3*  --  596.1* 635.0* 361.2* 552.3*  --    --    RADIOLOGY STUDIES/RESULTS: No results found.   LOS: 24 days   Signature  Lala Lund M.D on 04/24/2022 at 11:18 AM   -  To page go to [email or web address]

## 2022-04-24 NOTE — Progress Notes (Signed)
Occupational Therapy Treatment Patient Details Name: Christian [name] MRN: [id number] DOB: [birth year] Today's Date: 04/24/2022   History of present illness 71 y.o. male presented 5/2 with worsening strength , new decubitus ulcers and poor intake.  Recent admission for cellulitis and hypoglycemia, had just returned to ALF from SNF a week ago but not OOB since then.  Has new hypoglyemia, elevation of K+, creatinine, CK.  Ulcer is not infected but has LE skin is also irritated and has skin breakdown. LEFt UE swollen and Doppler ordered 5/15 to r/o DVT.  PMHx:  chronic venous stasis and chronic right shin unhealing wound, chronic HFpEF, IDDM, CAD with stenting x2 in 2021, PVD, CKD stage IIIa, HTN, DVT on Xarelto, bipolar disorder, OSA on CPAP at bedtime   OT comments  Focus of session on UB A/AROM B UEs and elevated on pillows. Total assist to clean and place adhesive on dentures.Pt demonstrated ability to drink with straw with cup and finger feed with item placed in his R hand, but needing total assist for food requiring utensils. Pt's inability to tolerate HOB up due to buttocks pain likely contributing to pt's inability to participate maximally in grooming and self feeding.    Recommendations for follow up therapy are one component of a multi-disciplinary discharge planning process, led by the attending physician.  Recommendations may be updated based on patient status, additional functional criteria and insurance authorization.    Follow Up Recommendations  Skilled nursing-short term rehab (<3 hours/day)    Assistance Recommended at Discharge Frequent or constant Supervision/Assistance  Patient can return home with the following  Two people to help with walking and/or transfers;Two people to help with bathing/dressing/bathroom;Assistance with feeding;Assistance with cooking/housework;Direct supervision/assist for medications management;Direct supervision/assist for financial management;Assist for  transportation;Help with stairs or ramp for entrance   Equipment Recommendations  Other (comment) (defer to next venue)    Recommendations for Other Services      Precautions / Restrictions Precautions Precautions: Fall       Mobility Bed Mobility Overal bed mobility: Needs Assistance Bed Mobility: Rolling Rolling: Max assist         General bed mobility comments: rolled to place wedge and off load buttocks    Transfers                         Balance                                           ADL either performed or assessed with clinical judgement   ADL Overall ADL's : Needs assistance/impaired Eating/Feeding: Moderate assistance;Sitting Eating/Feeding Details (indicate cue type and reason): pt can bring cup with straw to mouth to feed and finger feed, needs assist for utensil use Grooming: Oral care;Bed level;Total assistance Grooming Details (indicate cue type and reason): for cleaning dentures and placing adhesive                                    Extremity/Trunk Assessment              Vision       Perception     Praxis      Cognition Arousal/Alertness: Awake/alert Behavior During Therapy: Flat affect Overall Cognitive Status: Impaired/Different from baseline Area of Impairment: Memory, Following commands, Problem  solving                     Memory: Decreased short-term memory Following Commands: Follows one step commands with increased time     Problem Solving: Slow processing, Decreased initiation, Difficulty sequencing, Requires verbal cues General Comments: pt repeatedly asking if his daughter called or visited yesterday        Exercises Exercises: General Upper Extremity General Exercises - Upper Extremity Shoulder Flexion: AAROM, Both, Supine, 5 reps Elbow Flexion: AROM, Both, 5 reps, Supine Elbow Extension: AROM, Both, 5 reps, Supine Digit Composite Flexion: AROM, Both, 5  reps Composite Extension: AAROM, Both, 5 reps    Shoulder Instructions       General Comments      Pertinent Vitals/ Pain       Pain Assessment Pain Assessment: Faces Faces Pain Scale: Hurts little more Pain Location: buttocks Pain Descriptors / Indicators: Grimacing, Guarding Pain Intervention(s): Repositioned  Home Living                                          Prior Functioning/Environment              Frequency  Min 2X/week        Progress Toward Goals  OT Goals(current goals can now be found in the care plan section)  Progress towards OT goals: Progressing toward goals  Acute Rehab OT Goals OT Goal Formulation: With patient Time For Goal Achievement: 05/05/22 Potential to Achieve Goals: Fair  Plan Discharge plan remains appropriate    Co-evaluation                 AM-PAC OT "6 Clicks" Daily Activity     Outcome Measure   Help from another person eating meals?: A Lot Help from another person taking care of personal grooming?: Total Help from another person toileting, which includes using toliet, bedpan, or urinal?: Total Help from another person bathing (including washing, rinsing, drying)?: Total Help from another person to put on and taking off regular upper body clothing?: Total Help from another person to put on and taking off regular lower body clothing?: Total 6 Click Score: 7    End of Session    OT Visit Diagnosis: Muscle weakness (generalized) (M62.81);Pain   Activity Tolerance Patient tolerated treatment well   Patient Left in bed;with call bell/phone within reach   Nurse Communication          Time: 8588-5027 OT Time Calculation (min): 30 min  Charges: OT General Charges $OT Visit: 1 Visit OT Treatments $Self Care/Home Management : 8-22 mins $Therapeutic Exercise: 8-22 mins  Martie Round, OTR/L Acute Rehabilitation Services Pager: [phone] Office: [phone]   Evern Bio 04/24/2022, 11:59 AM

## 2022-04-24 NOTE — Progress Notes (Signed)
Palliative Medicine Progress Note   Patient Name: Christian [name]       Date: 04/24/2022 DOB: 1951/02/28  Age: 71 y.o. MRN#: [id number] Attending Physician: Leroy Sea, MD Primary Care Physician: [name], MD Admit Date: 03/30/2022  Reason for Consultation/Follow-up: {Reason for Consult:23484}  HPI/Patient Profile: 71 y.o. male  with past medical history of atrial fibrillation on Xarelto, HFpEF, PAD, DM type II, chronic kidney disease (baseline creatinine 1.6), and bipolar disorder.  He presented to the emergency department on 03/30/2022 with generalized weakness and sacral decubitus ulcer.  He was admitted to the hospitalist service with hypoglycemia and hyperkalemia.  There was initially concern of EPS.   On 5/14, he became febrile and tachycardic as well as developed discharge from his sacral decubitus.  He was started on broad-spectrum IV antibiotics.  Blood cultures came back positive for Streptococcus constellatus. On 5/16, CT abdomen/pelvis showed inferior sacral decubitus ulceration with no evidence of osteomyelitis.  Echo showed EF 45 to 50%, no obvious vegetations.  Subjective: ***  Objective:  Physical Exam Vitals reviewed.  Constitutional:      General: He is not in acute distress.    Appearance: He is obese.     Comments: Chronically ill-appearing  Pulmonary:     Effort: Pulmonary effort is normal.  Neurological:     Mental Status: He is alert.     Motor: Weakness present.  Psychiatric:        Mood and Affect: Affect is flat.            Vital Signs: BP 140/83 (BP Location: Left Arm)   Pulse 69   Temp 98 F (36.7 C) (Oral)   Resp 19   Ht 6' (1.829 m)   Wt 109.8 kg   SpO2 93%   BMI 32.83 kg/m  SpO2: SpO2: 93 % O2 Device: O2 Device: Room Air   LBM: Last  BM Date : 04/23/22     Palliative Assessment/Data: ***     Palliative Medicine Assessment & Plan   Assessment: Principal Problem:   Hyperkalemia Active Problems:   Bacteremia   Diabetes mellitus type 2 in nonobese (HCC)   Sacral decubitus ulcer, stage II (HCC)   Severe sepsis without septic shock (HCC)   Hypoglycemia    Recommendations/Plan: ***  Goals of Care and Additional Recommendations: Limitations  on Scope of Treatment: {Recommended Scope and Preferences:21019}  Code Status:   Prognosis:  {Palliative Care Prognosis:23504}  Discharge Planning: {Palliative dispostion:23505}  Care plan was discussed with ***  Thank you for allowing the Palliative Medicine Team to assist in the care of this patient.   ***   Merry Proud, NP   Please contact Palliative Medicine Team phone at [phone] for questions and concerns.  For individual providers, please see AMION.

## 2022-04-25 LAB — COMPREHENSIVE METABOLIC PANEL
ALT: 43 U/L (ref 0–44)
AST: 38 U/L (ref 15–41)
Albumin: 1.9 g/dL — ABNORMAL LOW (ref 3.5–5.0)
Alkaline Phosphatase: 68 U/L (ref 38–126)
Anion gap: 6 (ref 5–15)
BUN: 13 mg/dL (ref 8–23)
CO2: 26 mmol/L (ref 22–32)
Calcium: 9 mg/dL (ref 8.9–10.3)
Chloride: 101 mmol/L (ref 98–111)
Creatinine, Ser: 0.81 mg/dL (ref 0.61–1.24)
GFR, Estimated: >60 mL/min (ref >60)
Glucose, Bld: 134 mg/dL — ABNORMAL HIGH (ref 70–99)
Potassium: 4.5 mmol/L (ref 3.5–5.1)
Sodium: 133 mmol/L — ABNORMAL LOW (ref 135–145)
Total Bilirubin: 0.2 mg/dL — ABNORMAL LOW (ref 0.3–1.2)
Total Protein: 6.2 g/dL — ABNORMAL LOW (ref 6.5–8.1)

## 2022-04-25 LAB — CBC WITH DIFFERENTIAL/PLATELET
Abs Immature Granulocytes: 0.02 10*3/uL (ref 0.00–0.07)
Basophils Absolute: 0 10*3/uL (ref 0.0–0.1)
Basophils Relative: 0 %
Eosinophils Absolute: 0.1 10*3/uL (ref 0.0–0.5)
Eosinophils Relative: 2 %
HCT: 36.4 % — ABNORMAL LOW (ref 39.0–52.0)
Hemoglobin: 11.7 g/dL — ABNORMAL LOW (ref 13.0–17.0)
Immature Granulocytes: 0 %
Lymphocytes Relative: 26 %
Lymphs Abs: 1.3 10*3/uL (ref 0.7–4.0)
MCH: 27 pg (ref 26.0–34.0)
MCHC: 32.1 g/dL (ref 30.0–36.0)
MCV: 84.1 fL (ref 80.0–100.0)
Monocytes Absolute: 0.3 10*3/uL (ref 0.1–1.0)
Monocytes Relative: 7 %
Neutro Abs: 3.2 10*3/uL (ref 1.7–7.7)
Neutrophils Relative %: 65 %
Platelets: 341 10*3/uL (ref 150–400)
RBC: 4.33 MIL/uL (ref 4.22–5.81)
RDW: 17.1 % — ABNORMAL HIGH (ref 11.5–15.5)
WBC: 5 10*3/uL (ref 4.0–10.5)
nRBC: 0 % (ref 0.0–0.2)

## 2022-04-25 LAB — GLUCOSE, CAPILLARY
Glucose-Capillary: 94 mg/dL (ref 70–99)
Glucose-Capillary: 163 mg/dL — ABNORMAL HIGH (ref 70–99)
Glucose-Capillary: 126 mg/dL — ABNORMAL HIGH (ref 70–99)
Glucose-Capillary: 102 mg/dL — ABNORMAL HIGH (ref 70–99)

## 2022-04-25 LAB — MAGNESIUM: Magnesium: 1.7 mg/dL (ref 1.7–2.4)

## 2022-04-26 DIAGNOSIS — E875 Hyperkalemia: Secondary | ICD-10-CM | POA: Diagnosis not present

## 2022-04-26 LAB — COMPREHENSIVE METABOLIC PANEL
ALT: 34 U/L (ref 0–44)
AST: 27 U/L (ref 15–41)
Albumin: 2 g/dL — ABNORMAL LOW (ref 3.5–5.0)
Alkaline Phosphatase: 70 U/L (ref 38–126)
Anion gap: 3 — ABNORMAL LOW (ref 5–15)
BUN: 10 mg/dL (ref 8–23)
CO2: 26 mmol/L (ref 22–32)
Calcium: 9.2 mg/dL (ref 8.9–10.3)
Chloride: 102 mmol/L (ref 98–111)
Creatinine, Ser: 0.94 mg/dL (ref 0.61–1.24)
GFR, Estimated: >60 mL/min (ref >60)
Glucose, Bld: 100 mg/dL — ABNORMAL HIGH (ref 70–99)
Potassium: 4.7 mmol/L (ref 3.5–5.1)
Sodium: 131 mmol/L — ABNORMAL LOW (ref 135–145)
Total Bilirubin: 0.2 mg/dL — ABNORMAL LOW (ref 0.3–1.2)
Total Protein: 6.7 g/dL (ref 6.5–8.1)

## 2022-04-26 LAB — GLUCOSE, CAPILLARY
Glucose-Capillary: 83 mg/dL (ref 70–99)
Glucose-Capillary: 137 mg/dL — ABNORMAL HIGH (ref 70–99)
Glucose-Capillary: 165 mg/dL — ABNORMAL HIGH (ref 70–99)
Glucose-Capillary: 191 mg/dL — ABNORMAL HIGH (ref 70–99)

## 2022-04-26 LAB — CBC WITH DIFFERENTIAL/PLATELET
Abs Immature Granulocytes: 0.01 10*3/uL (ref 0.00–0.07)
Basophils Absolute: 0 10*3/uL (ref 0.0–0.1)
Basophils Relative: 1 %
Eosinophils Absolute: 0.1 10*3/uL (ref 0.0–0.5)
Eosinophils Relative: 2 %
HCT: 39.9 % (ref 39.0–52.0)
Hemoglobin: 12.5 g/dL — ABNORMAL LOW (ref 13.0–17.0)
Immature Granulocytes: 0 %
Lymphocytes Relative: 29 %
Lymphs Abs: 1.3 10*3/uL (ref 0.7–4.0)
MCH: 26.7 pg (ref 26.0–34.0)
MCHC: 31.3 g/dL (ref 30.0–36.0)
MCV: 85.1 fL (ref 80.0–100.0)
Monocytes Absolute: 0.5 10*3/uL (ref 0.1–1.0)
Monocytes Relative: 10 %
Neutro Abs: 2.7 10*3/uL (ref 1.7–7.7)
Neutrophils Relative %: 58 %
Platelets: 320 10*3/uL (ref 150–400)
RBC: 4.69 MIL/uL (ref 4.22–5.81)
RDW: 17.7 % — ABNORMAL HIGH (ref 11.5–15.5)
WBC: 4.6 10*3/uL (ref 4.0–10.5)
nRBC: 0 % (ref 0.0–0.2)

## 2022-04-26 LAB — UREA NITROGEN, URINE: Urea Nitrogen, Ur: 397 mg/dL

## 2022-04-26 LAB — MAGNESIUM: Magnesium: 1.6 mg/dL — ABNORMAL LOW (ref 1.7–2.4)

## 2022-04-26 MED ORDER — FUROSEMIDE 10 MG/ML IJ SOLN
INTRAMUSCULAR | Status: AC
  Filled 2022-04-26: qty 2

## 2022-04-26 MED ORDER — FUROSEMIDE 10 MG/ML IJ SOLN
20.0000 mg | Freq: Once | INTRAMUSCULAR | Status: AC
  Administered 2022-04-26: 20 mg via INTRAVENOUS
  Filled 2022-04-26: qty 2

## 2022-04-26 MED ORDER — MAGNESIUM SULFATE 4 GM/100ML IV SOLN
4.0000 g | Freq: Once | INTRAVENOUS | Status: AC
  Administered 2022-04-26: 4 g via INTRAVENOUS
  Filled 2022-04-26: qty 100

## 2022-04-26 MED ORDER — MORPHINE SULFATE (PF) 2 MG/ML IV SOLN
2.0000 mg | Freq: Once | INTRAVENOUS | Status: AC
  Administered 2022-04-26: 2 mg via INTRAVENOUS

## 2022-04-26 NOTE — Progress Notes (Signed)
PROGRESS NOTE        PATIENT DETAILS Name: Christian [name] Age: 71 y.o. Sex: male Date of Birth: [birth year] Admit Date: 03/30/2022 Admitting Physician Rise Patience, MD PCP:Sun, Gari Crown, MD  Brief Summary: Patient is a 71 y.o.  male with a history of A-fib on Xarelto, HFpEF, PAD, DM-2, bipolar disorder-presented with generalized weakness/sacral decubitus ulcer-was found to have hypoglycemia, hyperkalemia and subsequently admitted to the hospitalist service-see below for further details.  Thought to have extrapyramidal symptoms-evaluated by psych/neurology-psych medications adjusted with clinical improvement.  Unfortunately-further hospital course complicated by development of fever-due to probable Streptococcus bacteremia and infected decubitus ulcer.  See below for further details.  Significant events: 5/2>> admit to [facility] for weakness/hypoglycemia/hyperkalemia/rigidity-thought to have EPS 5/14>> febrile/tachycardic-Per nursing staff-discharge from prior existing sacral decubitus ulcer.  Started on broad-spectrum IV antibiotics.  Prelim blood culture positive for Streptococcus species. 5/19>> wound care starting hydrotherapy   Significant studies: 5/3>> x-ray pelvis: No fracture. 5/3>> CT head: No acute intracranial findings. 5/3>> x-ray left shoulder: Rotator cuff impingement 5/14>> CXR:No Obvious PNA 5/15>> left upper extremity Doppler: No DVT. 5/16>> CT abdomen/pelvis: Inferior sacral decubitus ulceration-no evidence of osteomyelitis. 5/16>> Echo: EF 45-50%, no obvious vegetations.  Significant microbiology data: 5/14>> blood culture: Streptococcus constellatus  5/15>> blood culture: No growth  Procedures:   Consults:  Neurology, psychiatry, infectious disease, CCS, Pall care  Subjective: Patient in bed, appears comfortable, denies any headache, no fever, no chest pain or pressure, no shortness of breath , no abdominal pain. No new focal  weakness.   Objective: Vitals: Blood pressure 116/85, pulse 85, temperature 98 F (36.7 C), temperature source Oral, resp. rate 18, height 6' (1.829 m), weight 109.8 kg, SpO2 94 %.   Exam:  Awake Alert, No new F.N deficits but chronically bedbound, contracture boots in both legs, severe chronic generalized weakness [place].AT,PERRAL Supple Neck, No JVD,   Symmetrical Chest wall movement, Good air movement bilaterally, CTAB RRR,No Gallops, Rubs or new Murmurs,  +ve B.Sounds, Abd Soft, No tenderness,   No Cyanosis, Clubbing or edema   Assessment/Plan:  Rigidity/tremors/mildly elevated CK: Felt to be more of extrapyramidal syndrome rather than NMS at this point.  Managed with supportive care.  After neurology/psych evaluation-has been resumed on Abilify and Cogentin-Wellbutrin & Seroquel have been discontinued.  Sepsis due to possible strep bacteremia-sacral decubitus ulcer with infection and underlying sacral abscess: Sepsis physiology improving-continues to have low-grade fever-once afebrile switch from Dapto to Zyvox (end date 5/31) and continue Augmentin until 5/24.  CT abdomen on 5/16 without any sacral osteomyelitis-TTE without vegetation.  Wound care following, had evidence of self opening of her sacral abscess with drainage on 04/17/2022 during wound care session, general surgery consulted, they have seen the patient no surgical intervention at this time per general surgery will continue wound care.  Patient currently is requiring hydrotherapy and complex wound care will benefit from LTAC placement rather than SNF.    Case discussed with ID repeat CT done on 04/19/2022 is nonacute, TEE done on 04/21/22 was -ve.  Repeat cultures drawn on 04/12/2022 are negative.  Final ID recommendations noted with antibiotics ending on 04/25/2022.   Acute metabolic encephalopathy: Due to sepsis--has significant amount of debility/deconditioning at baseline.    AKI on CKD stage IIIa: AKI likely  hemodynamically mediated-improved-creatinine close to baseline.   Mild rhabdomyolysis: Nontraumatic-improved with supportive care.  Hypokalemia and hypomagnesemia: Replaced  Hyponatremia.  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  Fluid restriction, gentle Lasix and monitor.    Hypoglycemia: Resolved with supportive care.  Do not resume Amaryl on discharge.  Hypothyroidism: Continue Cytomel.  PAF: Rate controlled-on Xarelto, added Lopressor as well.  HTN - added Lopressor.  PAD: Follow-up with vascular surgery in the outpatient setting  HFpEF with EF 45 to 50% on recent echocardiogram: Volume status stable-suspect has some amount of edema in lower extremities due to third spacing.  Demadex held due to sepsis physiology-should be able to resume over the next few days.  Bipolar disorder: Given concerns for EPS-on Abilify and Cogentin-Depakote/Wellbutrin and Seroquel held.  Left shoulder pain: Supportive care-likely rotator cuff injury.  Stable to be followed up by orthopedics in the outpatient setting  Left arm swelling: Likely due to IVF infiltration-could have superficial venous thrombosis-left upper extremity Doppler negative for DVT.  Supportive local care at this point.  Note patient is already on full dose anticoagulation for his A-fib.  OSA: CPAP nightly  DM-2 (A1c 7.6 on 4/15): CBG relatively stable-avoiding tight glycemic control-at risk for hypoglycemia-continue Semglee 10 units and SSI.  Recent Labs    04/25/22 1203 04/25/22 1600 04/25/22 2126  GLUCAP 163* 126* 102*     Debility/deconditioning: Has had worsening of his chronic debility during this hospitalization-plans are of SNF on discharge.  Per family-he has been nonambulatory since his discharge from [facility] in April 2023.  This likely resulted in decubitus ulcer that the patient presented with.  Nutrition Status: Nutrition Problem: Increased nutrient needs Etiology: chronic illness, wound healing Signs/Symptoms: estimated  needs Interventions: Ensure Enlive (each supplement provides 350kcal and 20 grams of protein), MVI, Juven, Other (Comment) (Feeding assist)  Pressure Ulcer: Pressure Injury 03/31/22 Sacrum Mid Unstageable - Full thickness tissue loss in which the base of the injury is covered by slough (yellow, tan, gray, green or brown) and/or eschar (tan, brown or black) in the wound bed. (Active)  03/31/22 1933  Location: Sacrum  Location Orientation: Mid  Staging: Unstageable - Full thickness tissue loss in which the base of the injury is covered by slough (yellow, tan, gray, green or brown) and/or eschar (tan, brown or black) in the wound bed.  Wound Description (Comments):   Present on Admission: Yes  Dressing Type Gauze (Comment);ABD;Santyl;Moist to dry 04/26/22 0536   Obesity: Estimated body mass index is 32.83 kg/m as calculated from the following:   Height as of this encounter: 6' (1.829 m).   Weight as of this encounter: 109.8 kg.   Code status:   Code Status: Full Code   DVT Prophylaxis: rivaroxaban (XARELTO) tablet 20 mg    Family Communication: Daughter-[name]-[phone]-updated on 5/18/ 23 by previous MD.   Disposition Plan: Status is: Inpatient Remains inpatient appropriate because: Not stable for discharge-resolving sepsis physiology due to streptococcal bacteremia/infected sacral decubitus ulcer-continues to have low-grade fever-on IV daptomycin/Augmentin.  Hydrotherapy to be started on 5/19.   Planned Discharge Destination:Skilled nursing facility   Diet: Diet Order             DIET DYS 2 Room service appropriate? No; Fluid consistency: Thin  Diet effective now                   Antimicrobial agents: MEDICATIONS: Scheduled Meds:  ARIPiprazole  10 mg Oral q morning   benztropine  1 mg Oral BID   Chlorhexidine Gluconate Cloth  6 each Topical Daily   collagenase   Topical Daily  diclofenac Sodium  2 g Topical QID   feeding supplement  237 mL Oral BID  BM   furosemide  20 mg Intravenous Once   insulin aspart  0-9 Units Subcutaneous TID WC   insulin glargine-yfgn  10 Units Subcutaneous Daily   linezolid  600 mg Oral Q12H   liothyronine  10 mcg Oral Daily   mouth rinse  15 mL Mouth Rinse BID   metoprolol tartrate  50 mg Oral BID   multivitamin with minerals  1 tablet Oral Daily   nutrition supplement (JUVEN)  1 packet Oral BID BM   rivaroxaban  20 mg Oral Q supper   senna-docusate  2 tablet Oral BID   Continuous Infusions:  magnesium sulfate bolus IVPB 4 g (04/26/22 0614)    PRN Meds:.acetaminophen **OR** acetaminophen, bisacodyl, HYDROcodone-acetaminophen, melatonin, morphine injection   I have personally reviewed following labs and imaging studies  LABORATORY DATA:  Recent Labs  Lab 04/22/22 0359 04/23/22 0128 04/24/22 0404 04/25/22 0110 04/26/22 0112  WBC 4.5 5.0 4.3 5.0 4.6  HGB 10.7* 11.3* 11.6* 11.7* 12.5*  HCT 33.2* 36.2* 37.2* 36.4* 39.9  PLT 320 302 301 341 320  MCV 84.3 85.4 85.7 84.1 85.1  MCH 27.2 26.7 26.7 27.0 26.7  MCHC 32.2 31.2 31.2 32.1 31.3  RDW 17.0* 17.0* 17.2* 17.1* 17.7*  LYMPHSABS 1.3 1.3 1.1 1.3 1.3  MONOABS 0.2 0.3 0.3 0.3 0.5  EOSABS 0.1 0.1 0.1 0.1 0.1  BASOSABS 0.0 0.0 0.0 0.0 0.0    Recent Labs  Lab 04/20/22 0245 04/21/22 0114 04/22/22 0359 04/23/22 0128 04/24/22 0404 04/25/22 0110 04/26/22 0112  NA 140 136 138 131* 133* 133* 131*  K 3.6 4.1 3.5 4.3 4.8 4.5 4.7  CL 113* 110 110 105 105 101 102  CO2 21* 23 25 23 23 26 26   GLUCOSE 89 150* 115* 136* 118* 134* 100*  BUN 17 16 14 12 12 13 10   CREATININE 0.93 0.85 0.75 0.81 0.83 0.81 0.94  CALCIUM 8.8* 8.5* 8.6* 8.4* 8.7* 9.0 9.2  AST 37 71* 63* 54* 48* 38 27  ALT 28 44 50* 48* 43 43 34  ALKPHOS 62 54 55 54 63 68 70  BILITOT 0.8 0.4 0.3 0.5 0.5 0.2* 0.2*  ALBUMIN 1.6* <1.5* 1.5* 1.5* 1.7* 1.9* 2.0*  MG 1.8 1.8 1.7 1.8 1.7 1.7 1.6*  CRP 10.1* 6.6* 4.4*  --   --   --   --   PROCALCITON 0.12 0.11 <0.10  --   --   --   --   BNP  635.0* 361.2* 552.3*  --   --   --   --    RADIOLOGY STUDIES/RESULTS: No results found.   LOS: 26 days   Signature  Lala Lund M.D on 04/26/2022 at 7:55 AM   -  To page go to [email or web address]

## 2022-04-26 NOTE — Progress Notes (Signed)
Physical Therapy Wound Treatment Patient Details  Name: Christian [name] MRN: [id number] Date of Birth: 09-16-1951  Today's Date: 04/26/2022 Time: 9371-6967 Time Calculation (min): 54 min  Subjective  Subjective Assessment Subjective: Pt stating "ouch" repeatedly during session. Patient and Family Stated Goals: agreeable to session Date of Onset:  (Unknown) Prior Treatments: Dressing changes  Pain Score:  Pre-medicated with multiple pain meds, but pt still reporting 7/10 pain and stating "ouch" throughout session  Wound Assessment  Pressure Injury 03/31/22 Sacrum Mid Unstageable - Full thickness tissue loss in which the base of the injury is covered by slough (yellow, tan, gray, green or brown) and/or eschar (tan, brown or black) in the wound bed. (Active)  Wound Image  04/23/22 1339  Dressing Type Foam - Lift dressing to assess site every shift;Gauze (Comment);Barrier Film (skin prep);Moist to moist;Santyl 04/26/22 1311  Dressing Clean, Dry, Intact;Changed 04/26/22 1311  Dressing Change Frequency Twice a day 04/26/22 1311  State of Healing Early/partial granulation 04/26/22 1311  Site / Wound Assessment Painful;Yellow;Pink;Red;Black;Brown 04/26/22 1311  % Wound base Red or Granulating 85% 04/26/22 1311  % Wound base Yellow/Fibrinous Exudate 15% 04/26/22 1311  % Wound base Black/Eschar 0% 04/26/22 1311  % Wound base Other/Granulation Tissue (Comment) 0% 04/26/22 1311  Peri-wound Assessment Purple;Pink 04/26/22 1311  Wound Length (cm) 9.5 cm 04/23/22 1339  Wound Width (cm) 9.5 cm 04/23/22 1339  Wound Depth (cm) 3.25 cm 04/23/22 1339  Wound Surface Area (cm^2) 90.25 cm^2 04/23/22 1339  Wound Volume (cm^3) 293.31 cm^3 04/23/22 1339  Tunneling (cm) 7.7 at ~7:00 04/23/22 1339  Undermining (cm) 2 from 7:00-8:00, 3 at 9:00, 2.5 from 11:00-12:00 04/23/22 1339  Margins Unattached edges (unapproximated) 04/26/22 1311  Drainage Amount Copious 04/26/22 1311  Drainage Description  Serosanguineous;Purulent 04/26/22 1311  Treatment Debridement (Selective);Irrigation;Other (Comment) 04/26/22 1311      Hydrotherapy Pulsed lavage therapy - wound location: Sacrum Pulsed Lavage with Suction (psi): 4 psi (started with 8-12 psi but pt reported pain, thus reduced to 4) Pulsed Lavage with Suction - Normal Saline Used: 1000 mL Pulsed Lavage Tip: Tip with splash shield Selective Debridement Selective Debridement - Location: Sacrum Selective Debridement - Tools Used: Forceps, Scalpel, Scissors Selective Debridement - Tissue Removed: Yellow nectrotic tissue    Wound Assessment and Plan  Wound Therapy - Assess/Plan/Recommendations Wound Therapy - Clinical Statement: Despite pt receiving multiple pain meds, he continued to state "ouch" repeatedly throughout the session. Secondary to his poor pain tolerance, decided to perform irrigation rather than pulsed lavage. The pt's wound continues to make gradual progress with decreased necrotic tissue, but the wound appears to be potentially at a plateau for improvement with hydrotherapy as there were only small areas that were able to be removed with selective debridement. Will plan to re-assess the wound next session and potentially sign off with hydrotherapy pending its appearance. Wound Therapy - Functional Problem List: Global weakness; apparently had not been out of bed since he got back to ALF from SNF ~1 week (PTA). Factors Delaying/Impairing Wound Healing: Diabetes Mellitus, Incontinence, Infection - systemic/local, Immobility, Multiple medical problems Hydrotherapy Plan: Debridement, Dressing change, Patient/family education, Pulsatile lavage with suction Wound Therapy - Frequency: 3X / week (M, W, F) Wound Therapy - Follow Up Recommendations: dressing changes by RN  Wound Therapy Goals- Improve the function of patient's integumentary system by progressing the wound(s) through the phases of wound healing (inflammation - proliferation  - remodeling) by: Wound Therapy Goals - Improve the function of patient's integumentary system by progressing the  wound(s) through the phases of wound healing by: Decrease Necrotic Tissue to: 10 Decrease Necrotic Tissue - Progress: Updated due to goal met Increase Granulation Tissue to: 90 Increase Granulation Tissue - Progress: Updated due to goal met Improve Drainage Characteristics: Min, Serous Improve Drainage Characteristics - Progress: Progressing toward goal Goals/treatment plan/discharge plan were made with and agreed upon by patient/family: Yes Time For Goal Achievement: 7 days Wound Therapy - Potential for Goals: Fair  Goals will be updated until maximal potential achieved or discharge criteria met.  Discharge criteria: when goals achieved, discharge from hospital, MD decision/surgical intervention, no progress towards goals, refusal/missing three consecutive treatments without notification or medical reason.  GP     Charges PT Wound Care Charges $Wound Debridement up to 20 cm: < or equal to 20 cm $PT Hydrotherapy Dressing: 3 dressings $PT Hydrotherapy Visit: 1 Visit      Moishe Spice, PT, DPT Acute Rehabilitation Services  Pager: [phone] Office: [place] 04/26/2022, 1:26 PM

## 2022-04-27 DIAGNOSIS — E875 Hyperkalemia: Secondary | ICD-10-CM | POA: Diagnosis not present

## 2022-04-27 LAB — BASIC METABOLIC PANEL
Anion gap: 5 (ref 5–15)
BUN: 18 mg/dL (ref 8–23)
CO2: 26 mmol/L (ref 22–32)
Calcium: 9 mg/dL (ref 8.9–10.3)
Chloride: 100 mmol/L (ref 98–111)
Creatinine, Ser: 1 mg/dL (ref 0.61–1.24)
GFR, Estimated: >60 mL/min (ref >60)
Glucose, Bld: 147 mg/dL — ABNORMAL HIGH (ref 70–99)
Potassium: 4.9 mmol/L (ref 3.5–5.1)
Sodium: 131 mmol/L — ABNORMAL LOW (ref 135–145)

## 2022-04-27 LAB — MAGNESIUM: Magnesium: 2 mg/dL (ref 1.7–2.4)

## 2022-04-27 LAB — OSMOLALITY: Osmolality: 284 mOsm/kg (ref 275–295)

## 2022-04-27 LAB — URIC ACID: Uric Acid, Serum: 5.8 mg/dL (ref 3.7–8.6)

## 2022-04-27 LAB — GLUCOSE, CAPILLARY
Glucose-Capillary: 115 mg/dL — ABNORMAL HIGH (ref 70–99)
Glucose-Capillary: 115 mg/dL — ABNORMAL HIGH (ref 70–99)
Glucose-Capillary: 193 mg/dL — ABNORMAL HIGH (ref 70–99)
Glucose-Capillary: 119 mg/dL — ABNORMAL HIGH (ref 70–99)

## 2022-04-27 MED ORDER — FUROSEMIDE 10 MG/ML IJ SOLN: 20.0000 mg | Freq: Once | INTRAMUSCULAR | Status: DC

## 2022-04-27 MED ORDER — METOPROLOL SUCCINATE ER 100 MG PO TB24
100.0000 mg | ORAL_TABLET | Freq: Every day | ORAL | Status: DC
Start: 2022-04-27 — End: 2022-04-27

## 2022-04-27 MED ORDER — ENSURE ENLIVE PO LIQD
237.0000 mL | Freq: Three times a day (TID) | ORAL | Status: DC
  Administered 2022-04-27 – 2022-05-06 (×26): 237 mL via ORAL
  Filled 2022-04-27: qty 237

## 2022-04-27 MED ORDER — METOPROLOL SUCCINATE ER 50 MG PO TB24
50.0000 mg | ORAL_TABLET | Freq: Every day | ORAL | Status: DC
  Administered 2022-04-27 – 2022-05-20 (×22): 50 mg via ORAL
  Filled 2022-04-27 (×23): qty 1

## 2022-04-27 MED ORDER — FUROSEMIDE 10 MG/ML IJ SOLN
40.0000 mg | Freq: Once | INTRAMUSCULAR | Status: AC
  Administered 2022-04-27: 40 mg via INTRAVENOUS
  Filled 2022-04-27: qty 4

## 2022-04-27 MED ORDER — ATORVASTATIN CALCIUM 10 MG PO TABS
10.0000 mg | ORAL_TABLET | Freq: Every day | ORAL | Status: DC
Start: 2022-04-27 — End: 2022-05-18
  Administered 2022-04-27 – 2022-05-16 (×20): 10 mg via ORAL
  Filled 2022-04-27 (×21): qty 1

## 2022-04-27 NOTE — TOC Progression Note (Addendum)
Transition of Care [facility]) - Progression Note    Patient Details  Name: Christian [name] MRN: [id number] Date of Birth: Dec 15, 1950  Transition of Care [facility]) CM/SW Contact  Lawerance Sabal, RN Phone Number: 04/27/2022, 12:50 PM  Clinical Narrative:    Per [facility] liaison, authorization is still pending    Expected Discharge Plan: Skilled Nursing Facility Barriers to Discharge: Continued Medical Work up  Expected Discharge Plan and Services Expected Discharge Plan: Skilled Nursing Facility In-house Referral: Clinical Social Work   Post Acute Care Choice: Skilled Nursing Facility Living arrangements for the past 2 months: Single Family Home, Skilled Nursing Facility                                       Social Determinants of Health (SDOH) Interventions    Readmission Risk Interventions    04/13/2022    3:06 PM 04/13/2022   10:43 AM 01/28/2022   10:17 AM  Readmission Risk Prevention Plan  Transportation Screening  Complete Complete  PCP or Specialist Appt within 5-7 Days   Complete  PCP or Specialist Appt within 3-5 Days Complete    Home Care Screening   Complete  Medication Review (RN CM)   Complete  HRI or Home Care Consult Complete    Social Work Consult for Recovery Care Planning/Counseling Complete    Palliative Care Screening Not Applicable    Medication Review [facility]) Referral to Pharmacy Complete   PCP or Specialist appointment within 3-5 days of discharge  Complete   HRI or Home Care Consult  Complete   SW Recovery Care/Counseling Consult  Complete   Palliative Care Screening  Not Applicable   Skilled Nursing Facility  Complete

## 2022-04-27 NOTE — Progress Notes (Signed)
Nutrition Follow-up  DOCUMENTATION CODES:   Not applicable  INTERVENTION:   Continue Multivitamin w/ minerals daily Increase Ensure Enlive po TID, each supplement provides 350 kcal and 20 grams of protein. Magic cup BID with meals, each supplement provides 290 kcal and 9 grams of protein Continue 1 packet Juven BID, each packet provides 95 calories, 2.5 grams of protein to support wound healing Feeding assist with all meals Meal ordering with assist  NUTRITION DIAGNOSIS:   Increased nutrient needs related to chronic illness, wound healing as evidenced by estimated needs. - Ongoing  GOAL:   Patient will meet greater than or equal to 90% of their needs - Ongoing  MONITOR:   PO intake, Supplement acceptance, Labs, Skin  REASON FOR ASSESSMENT:   Consult Assessment of nutrition requirement/status  ASSESSMENT:   71 y.o. male presented to the ED with generalized weakness. Pt recently discharged from rehab after hospital admission due to hypoglycemia cellulitis. PMH includes CHF, CKD III, HTN, and T2DM. Pt admitted with hypoglycemia, hyperkalemia, and generalized weakness.   5/11 - diet downgraded to Dysphagia 1, Nectar Thick Liquids 5/15 - NPO 5/16 - diet advanced to Dysphagia 1, Nectar Thick Liquids 5/18 - diet advanced to thin liquids 5/19 - Hydrotherapy started 5/25 - diet advanced to Dysphagia 2  Pt resting in bed. Reports that he is doing good.  Pt states that he is eating better. Endorses drinking the Ensure's and liking them. Pt does not remember if he hs been drinking Juven or not. RD to continue to encourage protein intake and the use of supplements to promote wound healing.  Per EMR, pt PO intake includes: 5/25: Breakfast 50%, Dinner 50% 5/26: Lunch 50% 5/27: Breakfast 25%, Lunch 25% 5/28: Breakfast 25%, Dinner 25% 5/29: Breakfast 20%  RD to make pt meal ordering with assist to encourage pt PO intake.   Medications reviewed and include: Augmenting, SSI 0-9  units TID, Semglee, MVI, Senokot Labs reviewed: Sodium 131, 24 hr CBG 115-193  Diet Order:   Diet Order             DIET DYS 2 Room service appropriate? No; Fluid consistency: Thin; Fluid restriction: 1500 mL Fluid  Diet effective now                  EDUCATION NEEDS:   No education needs have been identified at this time  Skin:  Skin Assessment: Skin Integrity Issues: Skin Integrity Issues:: Unstageable Stage III: Sacrum Unstageable: Coccyx & Buttocks  Last BM:  5/28  Height:  Ht Readings from Last 1 Encounters:  04/17/22 6' (1.829 m)   Weight:  Wt Readings from Last 1 Encounters:  04/17/22 109.8 kg   Ideal Body Weight:  80.9 kg  BMI:  Body mass index is 32.83 kg/m.  Estimated Nutritional Needs:  Kcal:  2100-2300 Protein:  105-120 grams Fluid:  >/= 2.1 L   Kirby Crigler RD, LDN Clinical Dietitian See [facility] for contact information.

## 2022-04-27 NOTE — Progress Notes (Signed)
PROGRESS NOTE        PATIENT DETAILS Name: Christian [name] Age: 71 y.o. Sex: male Date of Birth: 1951-06-03 Admit Date: 03/30/2022 Admitting Physician Eduard Clos, MD PCP:Sun, Charise Carwin, MD  Brief Summary: Patient is a 71 y.o.  male with a history of A-fib on Xarelto, HFpEF, PAD, DM-2, bipolar disorder-presented with generalized weakness/sacral decubitus ulcer-was found to have hypoglycemia, hyperkalemia and subsequently admitted to the hospitalist service-see below for further details.  Thought to have extrapyramidal symptoms-evaluated by psych/neurology-psych medications adjusted with clinical improvement.  Unfortunately-further hospital course complicated by development of fever-due to probable Streptococcus bacteremia and infected decubitus ulcer.  See below for further details.  Significant events: 5/2>> admit to [facility] for weakness/hypoglycemia/hyperkalemia/rigidity-thought to have EPS 5/14>> febrile/tachycardic-Per nursing staff-discharge from prior existing sacral decubitus ulcer.  Started on broad-spectrum IV antibiotics.  Prelim blood culture positive for Streptococcus species. 5/19>> wound care starting hydrotherapy   Significant studies: 5/3>> x-ray pelvis: No fracture. 5/3>> CT head: No acute intracranial findings. 5/3>> x-ray left shoulder: Rotator cuff impingement 5/14>> CXR:No Obvious PNA 5/15>> left upper extremity Doppler: No DVT. 5/16>> CT abdomen/pelvis: Inferior sacral decubitus ulceration-no evidence of osteomyelitis. 5/16>> Echo: EF 45-50%, no obvious vegetations.  Significant microbiology data: 5/14>> blood culture: Streptococcus constellatus  5/15>> blood culture: No growth  Procedures:   Consults:  Neurology, psychiatry, infectious disease, CCS, Pall care  Subjective: Patient in bed, appears comfortable, denies any headache, no fever, no chest pain or pressure, no shortness of breath , no abdominal pain. No new focal  weakness.   Objective: Vitals: Blood pressure 120/76, pulse 81, temperature 98.1 F (36.7 C), temperature source Oral, resp. rate 20, height 6' (1.829 m), weight 109.8 kg, SpO2 99 %.   Exam:  Awake Alert, No new F.N deficits but chronically bedbound, contracture boots in both legs, severe chronic generalized weakness [place].AT,PERRAL Supple Neck, No JVD,   Symmetrical Chest wall movement, Good air movement bilaterally, CTAB RRR,No Gallops, Rubs or new Murmurs,  +ve B.Sounds, Abd Soft, No tenderness,   No Cyanosis, Clubbing or edema      Assessment/Plan:  Rigidity/tremors/mildly elevated CK: Felt to be more of extrapyramidal syndrome rather than NMS at this point.  Managed with supportive care.  After neurology/psych evaluation-has been resumed on Abilify and Cogentin-Wellbutrin & Seroquel have been discontinued.  Sepsis due to possible strep bacteremia-sacral decubitus ulcer with infection and underlying sacral abscess: Sepsis physiology improving-continues to have low-grade fever-once afebrile switch from Dapto to Zyvox (end date 5/31) and continue Augmentin until 5/24.  CT abdomen on 5/16 without any sacral osteomyelitis-TTE without vegetation.  He had a draining abscess noted on wound care for which he was seen by general surgery and they recommended medical management, he also underwent TTE and TEE were unremarkable with negative final blood cultures.  He has finished his antibiotic regimen on 04/26/2022, was seen by ID and general surgery this admission.  Patient currently is requiring hydrotherapy and complex wound care will benefit from LTAC placement rather than SNF.      Acute metabolic encephalopathy: Due to sepsis -- has significant amount of debility/deconditioning at baseline.    AKI on CKD stage IIIa: AKI likely hemodynamically mediated-improved-creatinine close to baseline.   Mild rhabdomyolysis: Nontraumatic-improved with supportive care.  Hypokalemia and  hypomagnesemia: Replaced  Hyponatremia.  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  Fluid restriction, gentle IV Lasix on  04/27/2022 and monitor.    Hypoglycemia: Resolved with supportive care.  Do not resume Amaryl on discharge.  Hypothyroidism: Continue Cytomel.  PAF: Rate controlled-on Xarelto, added B Blocker as well.  HTN - added B.Blocker.  PAD: Follow-up with vascular surgery in the outpatient setting  HFpEF with EF 45 to 50% on recent echocardiogram: Volume status stable-resume Demadex on 04/28/2022  Bipolar disorder: Given concerns for EPS-on Abilify and Cogentin-Depakote/Wellbutrin and Seroquel held.  Left shoulder pain: Supportive care-likely rotator cuff injury.  Stable to be followed up by orthopedics in the outpatient setting  Left arm swelling: Likely due to IVF infiltration-could have superficial venous thrombosis-left upper extremity Doppler negative for DVT.  Supportive local care at this point.  Note patient is already on full dose anticoagulation for his A-fib.  OSA: CPAP nightly  DM-2 (A1c 7.6 on 4/15): CBG relatively stable-avoiding tight glycemic control-at risk for hypoglycemia-continue Semglee 10 units and SSI.  Recent Labs    04/26/22 1637 04/26/22 2106 04/27/22 0759  GLUCAP 165* 191* 115*     Debility/deconditioning: Has had worsening of his chronic debility during this hospitalization-plans are of SNF on discharge.  Per family-he has been nonambulatory since his discharge from [place] in April 2023.  This likely resulted in decubitus ulcer that the patient presented with.  Nutrition Status: Nutrition Problem: Increased nutrient needs Etiology: chronic illness, wound healing Signs/Symptoms: estimated needs Interventions: Ensure Enlive (each supplement provides 350kcal and 20 grams of protein), MVI, Juven, Other (Comment) (Feeding assist)  Pressure Ulcer: Pressure Injury 03/31/22 Sacrum Mid Unstageable - Full thickness tissue loss in which the base of the injury is  covered by slough (yellow, tan, gray, green or brown) and/or eschar (tan, brown or black) in the wound bed. (Active)  03/31/22 1933  Location: Sacrum  Location Orientation: Mid  Staging: Unstageable - Full thickness tissue loss in which the base of the injury is covered by slough (yellow, tan, gray, green or brown) and/or eschar (tan, brown or black) in the wound bed.  Wound Description (Comments):   Present on Admission: Yes  Dressing Type Gauze (Comment);Moist to dry 04/26/22 2100   Obesity: Estimated body mass index is 32.83 kg/m as calculated from the following:   Height as of this encounter: 6' (1.829 m).   Weight as of this encounter: 109.8 kg.   Code status:   Code Status: Full Code   DVT Prophylaxis: rivaroxaban (XARELTO) tablet 20 mg    Family Communication:   Daughter-[name]-called on 04/27/2022 at 8:40 AM   Disposition Plan: Status is: Inpatient Remains inpatient appropriate because: Not stable for discharge-resolving sepsis physiology due to streptococcal bacteremia/infected sacral decubitus ulcer-continues to have low-grade fever-on IV daptomycin/Augmentin.  Hydrotherapy to be started on 5/19.   Planned Discharge Destination:Skilled nursing facility   Diet: Diet Order             DIET DYS 2 Room service appropriate? No; Fluid consistency: Thin; Fluid restriction: 1500 mL Fluid  Diet effective now                   Antimicrobial agents: MEDICATIONS: Scheduled Meds:  ARIPiprazole  10 mg Oral q morning   benztropine  1 mg Oral BID   Chlorhexidine Gluconate Cloth  6 each Topical Daily   collagenase   Topical Daily   diclofenac Sodium  2 g Topical QID   feeding supplement  237 mL Oral BID BM   furosemide  20 mg Intravenous Once   insulin aspart  0-9  Units Subcutaneous TID WC   insulin glargine-yfgn  10 Units Subcutaneous Daily   liothyronine  10 mcg Oral Daily   mouth rinse  15 mL Mouth Rinse BID   metoprolol tartrate  50 mg  Oral BID   multivitamin with minerals  1 tablet Oral Daily   nutrition supplement (JUVEN)  1 packet Oral BID BM   rivaroxaban  20 mg Oral Q supper   senna-docusate  2 tablet Oral BID   Continuous Infusions:    PRN Meds:.acetaminophen **OR** acetaminophen, bisacodyl, HYDROcodone-acetaminophen, melatonin, morphine injection   I have personally reviewed following labs and imaging studies  LABORATORY DATA:  Recent Labs  Lab 04/22/22 0359 04/23/22 0128 04/24/22 0404 04/25/22 0110 04/26/22 0112  WBC 4.5 5.0 4.3 5.0 4.6  HGB 10.7* 11.3* 11.6* 11.7* 12.5*  HCT 33.2* 36.2* 37.2* 36.4* 39.9  PLT 320 302 301 341 320  MCV 84.3 85.4 85.7 84.1 85.1  MCH 27.2 26.7 26.7 27.0 26.7  MCHC 32.2 31.2 31.2 32.1 31.3  RDW 17.0* 17.0* 17.2* 17.1* 17.7*  LYMPHSABS 1.3 1.3 1.1 1.3 1.3  MONOABS 0.2 0.3 0.3 0.3 0.5  EOSABS 0.1 0.1 0.1 0.1 0.1  BASOSABS 0.0 0.0 0.0 0.0 0.0    Recent Labs  Lab 04/21/22 0114 04/22/22 0359 04/23/22 0128 04/24/22 0404 04/25/22 0110 04/26/22 0112 04/27/22 0054  NA 136 138 131* 133* 133* 131* 131*  K 4.1 3.5 4.3 4.8 4.5 4.7 4.9  CL 110 110 105 105 101 102 100  CO2 23 25 23 23 26 26 26   GLUCOSE 150* 115* 136* 118* 134* 100* 147*  BUN 16 14 12 12 13 10 18   CREATININE 0.85 0.75 0.81 0.83 0.81 0.94 1.00  CALCIUM 8.5* 8.6* 8.4* 8.7* 9.0 9.2 9.0  AST 71* 63* 54* 48* 38 27  --   ALT 44 50* 48* 43 43 34  --   ALKPHOS 54 55 54 63 68 70  --   BILITOT 0.4 0.3 0.5 0.5 0.2* 0.2*  --   ALBUMIN <1.5* 1.5* 1.5* 1.7* 1.9* 2.0*  --   MG 1.8 1.7 1.8 1.7 1.7 1.6* 2.0  CRP 6.6* 4.4*  --   --   --   --   --   PROCALCITON 0.11 <0.10  --   --   --   --   --   BNP 361.2* 552.3*  --   --   --   --   --    RADIOLOGY STUDIES/RESULTS: No results found.   LOS: 27 days   Signature  M.D on 04/27/2022 at 8:39 AM   -  To page go to [email or web address]

## 2022-04-27 NOTE — Progress Notes (Signed)
Speech Language Pathology Treatment: Dysphagia  Patient Details Name: ODYSSEUS CADA MRN: [id number] DOB: 1951/03/21 Today's Date: 04/27/2022 Time: 6349-4944 SLP Time Calculation (min) (ACUTE ONLY): 15 min  Assessment / Plan / Recommendation Clinical Impression  Patient seen by SLP for skilled treatment session focused on dysphagia goals. Patient was awake and alert and willing to eat some lunch. He continues to be total assist with self-feeding but is able to verbally communicate to direct his care. He consumed 4 bites of Dys 2 solids, some puree solids (mashed potatoes) but continues to be more interested in liquids (2 Ensure shakes, 5 ounces water and 4 ounces juice). His upper dentures were in place prior to SLP arriving and they appeared to be fitting well. No overt s/s aspiration or penetration observed. SLP will continue to follow patient to determine if he could upgrade to Dys 3 solids.    HPI HPI: 71 y.o. male presented 5/2 with worsening strength , new decubitus ulcers and poor intake.  Recent admission for cellulitis and hypoglycemia, had just returned to ALF from SNF a week ago but not OOB since then. CXR shows Low lung volumes with mild atelectasis at the right lung base. No  acute infiltrate is identified.Marland Kitchen      SLP Plan  Continue with current plan of care      Recommendations for follow up therapy are one component of a multi-disciplinary discharge planning process, led by the attending physician.  Recommendations may be updated based on patient status, additional functional criteria and insurance authorization.    Recommendations  Diet recommendations: Dysphagia 2 (fine chop);Thin liquid Liquids provided via: Straw Medication Administration: Whole meds with liquid Supervision: Full supervision/cueing for compensatory strategies;Trained caregiver to feed patient Compensations: Slow rate;Small sips/bites;Minimize environmental distractions Postural Changes and/or Swallow  Maneuvers: Seated upright 90 degrees                Oral Care Recommendations: Oral care BID;Staff/trained caregiver to provide oral care Follow Up Recommendations: Skilled nursing-short term rehab (<3 hours/day) Assistance recommended at discharge: Frequent or constant Supervision/Assistance SLP Visit Diagnosis: Dysphagia, unspecified (R13.10) Plan: Continue with current plan of care         Angela Nevin, MA, CCC-SLP Speech Therapy

## 2022-04-28 DIAGNOSIS — E875 Hyperkalemia: Secondary | ICD-10-CM | POA: Diagnosis not present

## 2022-04-28 LAB — BASIC METABOLIC PANEL
Anion gap: 5 (ref 5–15)
BUN: 22 mg/dL (ref 8–23)
CO2: 29 mmol/L (ref 22–32)
Calcium: 9.3 mg/dL (ref 8.9–10.3)
Chloride: 98 mmol/L (ref 98–111)
Creatinine, Ser: 1.2 mg/dL (ref 0.61–1.24)
GFR, Estimated: >60 mL/min (ref >60)
Glucose, Bld: 133 mg/dL — ABNORMAL HIGH (ref 70–99)
Potassium: 4.9 mmol/L (ref 3.5–5.1)
Sodium: 132 mmol/L — ABNORMAL LOW (ref 135–145)

## 2022-04-28 LAB — GLUCOSE, CAPILLARY
Glucose-Capillary: 134 mg/dL — ABNORMAL HIGH (ref 70–99)
Glucose-Capillary: 104 mg/dL — ABNORMAL HIGH (ref 70–99)
Glucose-Capillary: 237 mg/dL — ABNORMAL HIGH (ref 70–99)
Glucose-Capillary: 193 mg/dL — ABNORMAL HIGH (ref 70–99)

## 2022-04-28 NOTE — Progress Notes (Signed)
PROGRESS NOTE Christian [name]  PRX:[id number] DOB: April 29, 1951 DOA: 03/30/2022 PCP: [name], MD   Brief Narrative/Hospital Course: 71 y.o.  male with a history of A-fib on Xarelto, HFpEF, PAD, DM-2, bipolar disorder-presented with generalized weakness/sacral decubitus ulcer-was found to have hypoglycemia, hyperkalemia and subsequently admitted to the hospitalist service. Patient was thought to have extrapyramidal symptoms-evaluated by psych/neurology-psych medications adjusted with clinical improvement.  Unfortunately-further hospital course complicated by development of fever-due to probable Streptococcus bacteremia and infected decubitus ulcerPatient is seen by ID and general surgery.Patient completed antibiotics.  Overall doing well  5/2>> admit to [facility] for weakness/hypoglycemia/hyperkalemia/rigidity-thought to have EPS 5/14>> febrile/tachycardic-Per nursing staff-discharge from prior existing sacral decubitus ulcer.  Started on broad-spectrum IV antibiotics.  Prelim blood culture positive for Streptococcus species. 5/19>> wound care starting hydrotherapy 5/31>> discontinued hydrotherapy, wound care consulted.  Subjective: Seen and examined this morning.  He is resting comfortably just work with PT no new complaints he is alert awake oriented able to mobilize more with PT today   Assessment and Plan: Principal Problem:   Hyperkalemia Active Problems:   Bacteremia   Diabetes mellitus type 2 in nonobese (HCC)   Sacral decubitus ulcer, stage II (HCC)   Severe sepsis without septic shock (HCC)   Hypoglycemia  Rigidity/tremor/mildly elevated CK felt to be extrapyramidal syndrome rather than NMS: Management supportive care seen by neurology psychiatry resumed Abilify and Cogentin Wellbutrin and Seroquel CIWA discontinued.  Sepsis due to possible strep bacteria-sacral decubitus ulcer with infection and underlying sacral abscess: Sepsis patient is at this time is stable and improved.  Patient  complete Zyvox 5/31 and Augmentin 5/26. Patient was getting hydrotherapy from 5/19 discontinued 5/31 and to continue wound care wound care consulted  Acute metabolic encephalopathy due to sepsis significant disability mental status improved.  Conversant AKI on CKD stage IIIa in the setting of hemodynamic mediated creatinine improved to baseline Mild rhabdomyolysis nontraumatic improved with supportive care Hypokalemia and hypomagnesemia replaced Hyponatremia likely SIADH continue fluid restriction S/P IV Lasix Hypothyroidism continue cytomel PAF rate controlled on Xarelto, continue metoprolol 50 daily Hypertension blood pressure controlled metoprolol PAD follow-up with neurosurgery as outpatient Chronic CHF with preserved EF 45 to 50% on recent echo. Resume demedex. Upper extremity edema significantly improved   Bipolar disorder concern for EPS on Abilify and Cogentin ; Depakote and Wellbutrin and Seroquel OSA on CPAP nightly Left arm swelling likely due to IV infiltration could have superficial venous thrombosis left upper extremity.  Negative for DVT continue supportive care elevate the arm continue anticoagulation  Type 2 diabetes mellitus A1c 7.6 on 4/15 blood sugar well controlled on sliding. And semglee 10 units Recent Labs  Lab 04/27/22 1202 04/27/22 1612 04/27/22 2149 04/28/22 0800 04/28/22 1230  GLUCAP 115* 193* 119* 134* 104*    Debility/deconditioning in the setting of chronic illness.  Per family he has been nonambulatory since his discharge from hospital hospital in April 2023. Hypoglycemia resolved with supportive care.  DO NOT Resume Amaryl on discharge Class i Obesity:Patient's Body mass index is 32.83 kg/m. : Will benefit with PCP follow-up, weight loss  healthy lifestyle.   DVT prophylaxis:  Code Status:   Code Status: Full Code Family Communication: plan of care discussed with patient at bedside. Patient status is: Home because of ongoing management of wound,  deconditioning Level of care: Progressive   Dispo: The patient is from: home            Anticipated disposition: LTACH  Mobility Assessment (last 72 hours)  Mobility Assessment     Row Name 04/28/22 1200 04/28/22 0941 04/28/22 0900 04/28/22 0800 04/27/22 1600   Does patient have an order for bedrest or is patient medically unstable Yes- Bedfast (Level 1) - Complete -- -- Yes- Bedfast (Level 1) - Complete Yes- Bedfast (Level 1) - Complete   What is the highest level of mobility based on the progressive mobility assessment? Level 2 (Chairfast) - Balance while sitting on edge of bed and cannot stand Level 2 (Chairfast) - Balance while sitting on edge of bed and cannot stand Level 2 (Chairfast) - Balance while sitting on edge of bed and cannot stand Level 2 (Chairfast) - Balance while sitting on edge of bed and cannot stand Level 2 (Chairfast) - Balance while sitting on edge of bed and cannot stand   Is the above level different from baseline mobility prior to current illness? Yes - Recommend PT order -- -- Yes - Recommend PT order --    Eland Name 04/27/22 1200 04/27/22 0755 04/26/22 2100 04/26/22 0815 04/25/22 2236   Does patient have an order for bedrest or is patient medically unstable Yes- Bedfast (Level 1) - Complete Yes- Bedfast (Level 1) - Complete No - Continue assessment No - Continue assessment No - Continue assessment   What is the highest level of mobility based on the progressive mobility assessment? Level 2 (Chairfast) - Balance while sitting on edge of bed and cannot stand Level 2 (Chairfast) - Balance while sitting on edge of bed and cannot stand Level 2 (Chairfast) - Balance while sitting on edge of bed and cannot stand Level 1 (Bedfast) - Unable to balance while sitting on edge of bed Level 1 (Bedfast) - Unable to balance while sitting on edge of bed   Is the above level different from baseline mobility prior to current illness? -- -- Yes - Recommend PT order Yes - Recommend PT  order Yes - Recommend PT order             Objective: Vitals last 24 hrs: Vitals:   04/28/22 0400 04/28/22 0800 04/28/22 1135 04/28/22 1226  BP: 100/70 98/72  99/66  Pulse: 92 92  (!) 101  Resp: 20 20 18 20   Temp: 98 F (36.7 C) 98 F (36.7 C)  99.3 F (37.4 C)  TempSrc: Axillary Axillary  Axillary  SpO2: 96% 92%  95%  Weight:      Height:       Weight change:   Physical Examination: General exam: alert awake,older than stated age, weak appearing. HEENT:Oral mucosa moist, Ear/Nose WNL grossly, dentition normal. Respiratory system: bilaterally diminished BS, no use of accessory muscle Cardiovascular system: S1 & S2 +, No JVD. Gastrointestinal system: Abdomen soft,NT,ND, BS+ Nervous System:Alert, awake, moving extremities and grossly nonfocal Extremities: LE edema mild,distal peripheral pulses palpable.  Skin: No rashes,no icterus. MSK: Normal muscle bulk,tone, power  Medications reviewed:  Scheduled Meds:  ARIPiprazole  10 mg Oral q morning   atorvastatin  10 mg Oral Daily   benztropine  1 mg Oral BID   Chlorhexidine Gluconate Cloth  6 each Topical Daily   diclofenac Sodium  2 g Topical QID   feeding supplement  237 mL Oral TID BM   insulin aspart  0-9 Units Subcutaneous TID WC   insulin glargine-yfgn  10 Units Subcutaneous Daily   liothyronine  10 mcg Oral Daily   mouth rinse  15 mL Mouth Rinse BID   metoprolol succinate  50 mg Oral Daily   multivitamin with  minerals  1 tablet Oral Daily   nutrition supplement (JUVEN)  1 packet Oral BID BM   rivaroxaban  20 mg Oral Q supper   senna-docusate  2 tablet Oral BID   Continuous Infusions:  Pressure Injury 03/31/22 Sacrum Mid Unstageable - Full thickness tissue loss in which the base of the injury is covered by slough (yellow, tan, gray, green or brown) and/or eschar (tan, brown or black) in the wound bed. (Active)  03/31/22 1933  Location: Sacrum  Location Orientation: Mid  Staging: Unstageable - Full thickness  tissue loss in which the base of the injury is covered by slough (yellow, tan, gray, green or brown) and/or eschar (tan, brown or black) in the wound bed.  Wound Description (Comments):   Present on Admission: Yes  Dressing Type Gauze (Comment);Foam - Lift dressing to assess site every shift 04/27/22 1700   Diet Order             DIET DYS 2 Room service appropriate? No; Fluid consistency: Thin; Fluid restriction: 1500 mL Fluid  Diet effective now                    Nutrition Problem: Increased nutrient needs Etiology: chronic illness, wound healing Signs/Symptoms: estimated needs Interventions: Ensure Enlive (each supplement provides 350kcal and 20 grams of protein), Magic cup, Juven, MVI  No intake or output data in the 24 hours ending 04/28/22 1357 Net IO Since Admission: -12,519.49 mL [04/28/22 1357]  Wt Readings from Last 3 Encounters:  04/17/22 109.8 kg  03/19/22 112 kg  03/11/22 117.9 kg     Unresulted Labs (From admission, onward)     Start     Ordered   04/27/22 0616  Sodium, urine, random  Once,   R        04/27/22 0615   04/27/22 0616  Osmolality, urine  Once,   R        04/27/22 0615   04/27/22 0616  Creatinine, urine, random  Once,   R        04/27/22 0615          Data Reviewed: I have personally reviewed following labs and imaging studies CBC: Recent Labs  Lab 04/22/22 0359 04/23/22 0128 04/24/22 0404 04/25/22 0110 04/26/22 0112  WBC 4.5 5.0 4.3 5.0 4.6  NEUTROABS 2.9 3.3 2.7 3.2 2.7  HGB 10.7* 11.3* 11.6* 11.7* 12.5*  HCT 33.2* 36.2* 37.2* 36.4* 39.9  MCV 84.3 85.4 85.7 84.1 85.1  PLT 320 302 301 341 [id number]   Basic Metabolic Panel: Recent Labs  Lab 04/23/22 0128 04/24/22 0404 04/25/22 0110 04/26/22 0112 04/27/22 0054 04/28/22 0211  NA 131* 133* 133* 131* 131* 132*  K 4.3 4.8 4.5 4.7 4.9 4.9  CL 105 105 101 102 100 98  CO2 23 23 26 26 26 29   GLUCOSE 136* 118* 134* 100* 147* 133*  BUN 12 12 13 10 18 22   CREATININE 0.81 0.83 0.81  0.94 1.00 1.20  CALCIUM 8.4* 8.7* 9.0 9.2 9.0 9.3  MG 1.8 1.7 1.7 1.6* 2.0  --    GFR: Estimated Creatinine Clearance: 75.4 mL/min (by C-G formula based on SCr of 1.2 mg/dL). Liver Function Tests: Recent Labs  Lab 04/22/22 0359 04/23/22 0128 04/24/22 0404 04/25/22 0110 04/26/22 0112  AST 63* 54* 48* 38 27  ALT 50* 48* 43 43 34  ALKPHOS 55 54 63 68 70  BILITOT 0.3 0.5 0.5 0.2* 0.2*  PROT 5.7* 5.7* 5.8* 6.2* 6.7  ALBUMIN  1.5* 1.5* 1.7* 1.9* 2.0*   No results for input(s): LIPASE, AMYLASE in the last 168 hours. No results for input(s): AMMONIA in the last 168 hours. Coagulation Profile: No results for input(s): INR, PROTIME in the last 168 hours. BNP (last 3 results) No results for input(s): PROBNP in the last 8760 hours. HbA1C: No results for input(s): HGBA1C in the last 72 hours. CBG: Recent Labs  Lab 04/27/22 1202 04/27/22 1612 04/27/22 2149 04/28/22 0800 04/28/22 1230  GLUCAP 115* 193* 119* 134* 104*   Lipid Profile: No results for input(s): CHOL, HDL, LDLCALC, TRIG, CHOLHDL, LDLDIRECT in the last 72 hours. Thyroid Function Tests: No results for input(s): TSH, T4TOTAL, FREET4, T3FREE, THYROIDAB in the last 72 hours. Sepsis Labs: Recent Labs  Lab 04/22/22 0359  PROCALCITON <0.10    No results found for this or any previous visit (from the past 240 hour(s)).  Antimicrobials: Anti-infectives (From admission, onward)    Start     Dose/Rate Route Frequency Ordered Stop   04/22/22 1015  linezolid (ZYVOX) tablet 600 mg        600 mg Oral Every 12 hours 04/22/22 0916 04/26/22 2110   04/15/22 0900  vancomycin (VANCOREADY) IVPB 1500 mg/300 mL  Status:  Discontinued        1,500 mg 150 mL/hr over 120 Minutes Intravenous Every 24 hours 04/14/22 0811 04/14/22 1005   04/14/22 2200  amoxicillin-clavulanate (AUGMENTIN) 875-125 MG per tablet 1 tablet        1 tablet Oral Every 12 hours 04/14/22 1331 04/23/22 2148   04/14/22 1100  DAPTOmycin (CUBICIN) 700 mg in sodium  chloride 0.9 % IVPB  Status:  Discontinued        8 mg/kg  90.4 kg (Adjusted) 128 mL/hr over 30 Minutes Intravenous Daily 04/14/22 1005 04/22/22 0916   04/14/22 0900  vancomycin (VANCOREADY) IVPB 2000 mg/400 mL  Status:  Discontinued        2,000 mg 200 mL/hr over 120 Minutes Intravenous  Once 04/14/22 0811 04/14/22 1005   04/13/22 2200  metroNIDAZOLE (FLAGYL) tablet 500 mg  Status:  Discontinued        500 mg Oral Every 12 hours 04/13/22 1436 04/14/22 1331   04/12/22 0815  cefTRIAXone (ROCEPHIN) 2 g in sodium chloride 0.9 % 100 mL IVPB  Status:  Discontinued        2 g 200 mL/hr over 30 Minutes Intravenous Daily 04/12/22 0717 04/14/22 0811   04/12/22 0600  vancomycin (VANCOREADY) IVPB 1250 mg/250 mL  Status:  Discontinued        1,250 mg 166.7 mL/hr over 90 Minutes Intravenous Every 24 hours 04/11/22 0521 04/12/22 0717   04/11/22 1145  metroNIDAZOLE (FLAGYL) IVPB 500 mg  Status:  Discontinued        500 mg 100 mL/hr over 60 Minutes Intravenous Every 12 hours 04/11/22 1055 04/13/22 1436   04/11/22 0600  ceFEPIme (MAXIPIME) 2 g in sodium chloride 0.9 % 100 mL IVPB  Status:  Discontinued        2 g 200 mL/hr over 30 Minutes Intravenous Every 8 hours 04/11/22 0437 04/12/22 0717   04/11/22 0530  vancomycin (VANCOREADY) IVPB 2000 mg/400 mL        2,000 mg 200 mL/hr over 120 Minutes Intravenous  Once 04/11/22 0437 04/11/22 2008      Culture/Microbiology    Component Value Date/Time   SDES BLOOD LEFT HAND 04/12/2022 1222   SPECREQUEST  04/12/2022 1222    BOTTLES DRAWN AEROBIC ONLY Blood Culture  results may not be optimal due to an inadequate volume of blood received in culture bottles   CULT  04/12/2022 1222    NO GROWTH 5 DAYS Performed at [facility] [address] [place], [place] [address]    REPTSTATUS 04/17/2022 FINAL 04/12/2022 1222  Radiology Studies: No results found.   LOS: 28 days   Antonieta Pert, MD [facility]  04/28/2022, 1:57 PM

## 2022-04-28 NOTE — TOC Progression Note (Addendum)
Transition of Care [facility]) - Progression Note    Patient Details  Name: BHARAT ANTILLON MRN: [id number] Date of Birth: 1951-05-28  Transition of Care [facility] CM/SW Contact  Mearl Latin, LCSW Phone Number: 04/28/2022, 11:23 AM  Clinical Narrative:    11:23a-Insurance has requested [facility] for [facility]. CSW contacted [phone] and left voicemail with the MD's contact info.   3pm-Per MD, ltach peer to peer denied. CSW alerted [facility] of need for SNF auth.     Expected Discharge Plan: Skilled Nursing Facility Barriers to Discharge: Continued Medical Work up  Expected Discharge Plan and Services Expected Discharge Plan: Skilled Nursing Facility In-house Referral: Clinical Social Work   Post Acute Care Choice: Skilled Nursing Facility Living arrangements for the past 2 months: Single Family Home, Skilled Nursing Facility                                       Social Determinants of Health (SDOH) Interventions    Readmission Risk Interventions    04/13/2022    3:06 PM 04/13/2022   10:43 AM 01/28/2022   10:17 AM  Readmission Risk Prevention Plan  Transportation Screening  Complete Complete  PCP or Specialist Appt within 5-7 Days   Complete  PCP or Specialist Appt within 3-5 Days Complete    Home Care Screening   Complete  Medication Review (RN CM)   Complete  HRI or Home Care Consult Complete    Social Work Consult for Recovery Care Planning/Counseling Complete    Palliative Care Screening Not Applicable    Medication Review [employer]) Referral to Pharmacy Complete   PCP or Specialist appointment within 3-5 days of discharge  Complete   HRI or Home Care Consult  Complete   SW Recovery Care/Counseling Consult  Complete   Palliative Care Screening  Not Applicable   Skilled Nursing Facility  Complete

## 2022-04-28 NOTE — Progress Notes (Signed)
Physical Therapy Treatment Patient Details Name: Christian [name] MRN: [id number] DOB: 1951/03/18 Today's Date: 04/28/2022   History of Present Illness 71 y.o. male presented 5/2 with worsening strength , new decubitus ulcers and poor intake.  Recent admission for cellulitis and hypoglycemia, had just returned to ALF from SNF a week ago but not OOB since then.  Has new hypoglyemia, elevation of K+, creatinine, CK.  Ulcer is not infected but has LE skin is also irritated and has skin breakdown. LEFt UE swollen and Doppler ordered 5/15 to r/o DVT.  PMHx:  chronic venous stasis and chronic right shin unhealing wound, chronic HFpEF, IDDM, CAD with stenting x2 in 2021, PVD, CKD stage IIIa, HTN, DVT on Xarelto, bipolar disorder, OSA on CPAP at bedtime    PT Comments    Patient asleep on arrival and with slightly slower processing this session compared to last. He was able to perform a greater percentage of the mobility tasks during session and sitting balance much improved (7.5 minutes with minguard assist). Patient singing with weak voice during session and encouraged him to continue to work on his singing to help strengthen his voice to be better heard/understood.   Recommendations for follow up therapy are one component of a multi-disciplinary discharge planning process, led by the attending physician.  Recommendations may be updated based on patient status, additional functional criteria and insurance authorization.  Follow Up Recommendations  Skilled nursing-short term rehab (<3 hours/day)     Assistance Recommended at Discharge Frequent or constant Supervision/Assistance  Patient can return home with the following Two people to help with walking and/or transfers;Assistance with cooking/housework;Assist for transportation;Help with stairs or ramp for entrance;Two people to help with bathing/dressing/bathroom;Assistance with feeding;Direct supervision/assist for medications management;Direct  supervision/assist for financial management   Equipment Recommendations  Other (comment) (hoyer lift)    Recommendations for Other Services       Precautions / Restrictions Precautions Precautions: Fall Precaution Comments: monitor vitals Restrictions Weight Bearing Restrictions: No     Mobility  Bed Mobility Overal bed mobility: Needs Assistance Bed Mobility: Rolling, Sidelying to Sit, Sit to Sidelying Rolling: Max assist Sidelying to sit: Max assist, +2 for physical assistance     Sit to sidelying: +2 for physical assistance, Max assist General bed mobility comments: pt able to move each leg towards EOB prior to rolling onto his side with assist; assist to move legs off the bed and to raise torso, however pt assisting with both UEs on rail (and then with LUE reaching towards footboard; pt assisted with lowering torso onto his side and with raising his legs up onto bed; +2 total to scoot to [facility]    Transfers                   General transfer comment: unable    Ambulation/Gait               General Gait Details: unable   Stairs             Wheelchair Mobility    Modified Rankin (Stroke Patients Only)       Balance Overall balance assessment: Needs assistance, History of Falls Sitting-balance support: Feet supported, Single extremity supported Sitting balance-Leahy Scale: Poor Sitting balance - Comments: sat EOB 7.5 minutes with minguard assist; pt preference for bil UE support, however could do single UE support when requested or when taking cup to drink  Cognition Arousal/Alertness: Awake/alert Behavior During Therapy: Flat affect Overall Cognitive Status: Impaired/Different from baseline Area of Impairment: Memory, Following commands, Problem solving                     Memory: Decreased short-term memory Following Commands: Follows one step commands with increased time      Problem Solving: Slow processing, Decreased initiation, Difficulty sequencing, Requires verbal cues General Comments: Patient slightly more delayed compared to last visit (?due to sleeping on arrival); difficulty recalling lyrics to songs        Exercises General Exercises - Lower Extremity Long Arc Quad: AROM, Both, Seated, 10 reps Other Exercises Other Exercises: worked on midline posture with various hand placement and pushing up from L and rt lateral lean down on elbow    General Comments        Pertinent Vitals/Pain Pain Assessment Pain Assessment: Faces Faces Pain Scale: Hurts little more Pain Location: buttocks Pain Descriptors / Indicators: Grimacing, Guarding Pain Intervention(s): Limited activity within patient's tolerance, Monitored during session, Repositioned    Home Living                          Prior Function            PT Goals (current goals can now be found in the care plan section) Acute Rehab PT Goals Patient Stated Goal: not stated PT Goal Formulation: With patient Time For Goal Achievement: 05/12/22 Potential to Achieve Goals: Fair Progress towards PT goals: Progressing toward goals (slow progress; goals updated based on timeframe)    Frequency    Min 2X/week      PT Plan Current plan remains appropriate    Co-evaluation PT/OT/SLP Co-Evaluation/Treatment: Yes Reason for Co-Treatment: Complexity of the patient's impairments (multi-system involvement);For patient/therapist safety;To address functional/ADL transfers PT goals addressed during session: Mobility/safety with mobility;Balance;Strengthening/ROM        AM-PAC PT "6 Clicks" Mobility   Outcome Measure  Help needed turning from your back to your side while in a flat bed without using bedrails?: Total Help needed moving from lying on your back to sitting on the side of a flat bed without using bedrails?: Total Help needed moving to and from a bed to a chair (including  a wheelchair)?: Total Help needed standing up from a chair using your arms (e.g., wheelchair or bedside chair)?: Total Help needed to walk in hospital room?: Total Help needed climbing 3-5 steps with a railing? : Total 6 Click Score: 6    End of Session   Activity Tolerance: Patient limited by pain;Patient limited by fatigue Patient left: in bed;with call bell/phone within reach   PT Visit Diagnosis: Unsteadiness on feet (R26.81);History of falling (Z91.81);Repeated falls (R29.6);Difficulty in walking, not elsewhere classified (R26.2)     Time: [id number] PT Time Calculation (min) (ACUTE ONLY): 32 min  Charges:  $Therapeutic Activity: 8-22 mins                      Arby Barrette, PT Acute Rehabilitation Services  Pager [phone] Office [phone]    Rexanne Mano 04/28/2022, 9:52 AM

## 2022-04-28 NOTE — Progress Notes (Signed)
Occupational Therapy Treatment Patient Details Name: Christian [name] MRN: [id number] DOB: 1951/07/27 Today's Date: 04/28/2022   History of present illness 71 y.o. male presented 5/2 with worsening strength , new decubitus ulcers and poor intake.  Recent admission for cellulitis and hypoglycemia, had just returned to ALF from SNF a week ago but not OOB since then.  Has new hypoglyemia, elevation of K+, creatinine, CK.  Ulcer is not infected but has LE skin is also irritated and has skin breakdown. LEFt UE swollen and Doppler ordered 5/15 to r/o DVT.  PMHx:  chronic venous stasis and chronic right shin unhealing wound, chronic HFpEF, IDDM, CAD with stenting x2 in 2021, PVD, CKD stage IIIa, HTN, DVT on Xarelto, bipolar disorder, OSA on CPAP at bedtime   OT comments  Pt with less edema and able to move extremities against gravity with much less effort. Continues to need +2 assist for bed mobility. Tolerated sitting EOB with B UE support x 7 1/2 minutes. Able to drink from straw with either hand. Pt singing gospel song with low volume.    Recommendations for follow up therapy are one component of a multi-disciplinary discharge planning process, led by the attending physician.  Recommendations may be updated based on patient status, additional functional criteria and insurance authorization.    Follow Up Recommendations  Skilled nursing-short term rehab (<3 hours/day)    Assistance Recommended at Discharge Frequent or constant Supervision/Assistance  Patient can return home with the following  Two people to help with walking and/or transfers;Two people to help with bathing/dressing/bathroom;Assistance with feeding;Assistance with cooking/housework;Direct supervision/assist for medications management;Direct supervision/assist for financial management;Assist for transportation;Help with stairs or ramp for entrance   Equipment Recommendations  Other (comment) (defer to next venue)    Recommendations  for Other Services      Precautions / Restrictions Precautions Precautions: Fall Precaution Comments: monitor vitals Restrictions Weight Bearing Restrictions: No       Mobility Bed Mobility Overal bed mobility: Needs Assistance Bed Mobility: Rolling, Sidelying to Sit, Sit to Sidelying Rolling: Max assist Sidelying to sit: +2 for physical assistance, Max assist     Sit to sidelying: +2 for physical assistance, Max assist General bed mobility comments: step by step cues and increased time, but pt demonstrating improved ability to advance LEs toward EOB, max assist to roll for pt to reach for bed rail with opposite hand, +2 max to raise trunk with cues to reach for foot board. Pt with preference for B UE support on footboard and rail, but able to sit with hands on mattress with min guard assist.    Transfers                         Balance Overall balance assessment: Needs assistance Sitting-balance support: Bilateral upper extremity supported, Feet supported Sitting balance-Leahy Scale: Fair Sitting balance - Comments: tolerated x 7 1/2 minutes with encouragement, no c/o of dizziness                                   ADL either performed or assessed with clinical judgement   ADL Overall ADL's : Needs assistance/impaired Eating/Feeding: Supervision/ safety;Bed level Eating/Feeding Details (indicate cue type and reason): able to bring cup with straw to drink with either hand     Upper Body Bathing: Total assistance;Sitting           Lower Body Dressing: Total  assistance;Bed level                      Extremity/Trunk Assessment              Vision       Perception     Praxis      Cognition Arousal/Alertness: Awake/alert Behavior During Therapy: Flat affect Overall Cognitive Status: Impaired/Different from baseline Area of Impairment: Memory, Following commands, Problem solving, Attention                   Current  Attention Level: Sustained Memory: Decreased short-term memory Following Commands: Follows one step commands with increased time     Problem Solving: Slow processing, Decreased initiation, Difficulty sequencing, Requires verbal cues General Comments: pt needing step by step cues to bring LEs toward EOB when sitting up        Exercises      Shoulder Instructions       General Comments      Pertinent Vitals/ Pain       Pain Assessment Pain Assessment: Faces Faces Pain Scale: Hurts little more Pain Location: buttocks Pain Descriptors / Indicators: Grimacing, Guarding, Discomfort Pain Intervention(s): Repositioned, Monitored during session  Home Living                                          Prior Functioning/Environment              Frequency  Min 2X/week        Progress Toward Goals  OT Goals(current goals can now be found in the care plan section)  Progress towards OT goals: Progressing toward goals  Acute Rehab OT Goals OT Goal Formulation: With patient Time For Goal Achievement: 05/05/22 Potential to Achieve Goals: [place] Discharge plan remains appropriate    Co-evaluation    PT/OT/SLP Co-Evaluation/Treatment: Yes Reason for Co-Treatment: Complexity of the patient's impairments (multi-system involvement);For patient/therapist safety;To address functional/ADL transfers PT goals addressed during session: Mobility/safety with mobility;Balance;Strengthening/ROM OT goals addressed during session: Strengthening/ROM      AM-PAC OT "6 Clicks" Daily Activity     Outcome Measure   Help from another person eating meals?: A Lot Help from another person taking care of personal grooming?: Total Help from another person toileting, which includes using toliet, bedpan, or urinal?: Total Help from another person bathing (including washing, rinsing, drying)?: Total Help from another person to put on and taking off regular upper body  clothing?: Total Help from another person to put on and taking off regular lower body clothing?: Total 6 Click Score: 7    End of Session    OT Visit Diagnosis: Muscle weakness (generalized) (M62.81);Pain   Activity Tolerance Patient tolerated treatment well   Patient Left in bed;with call bell/phone within reach   Nurse Communication          Time: [id number] OT Time Calculation (min): 32 min  Charges: OT General Charges $OT Visit: 1 Visit OT Treatments $Therapeutic Activity: 8-22 mins  Nestor Lewandowsky, OTR/L Acute Rehabilitation Services Pager: [phone] Office: [phone]   Malka So 04/28/2022, 9:58 AM

## 2022-04-28 NOTE — Hospital Course (Addendum)
71 y.o.  male with a history of A-fib on Xarelto, HFpEF, PAD, DM-2, bipolar disorder-presented with generalized weakness/sacral decubitus ulcer-was found to have hypoglycemia, hyperkalemia and subsequently admitted to the hospitalist service. Patient was thought to have extrapyramidal symptoms-evaluated by psych/neurology-psych medications adjusted with clinical improvement.  Unfortunately-further hospital course complicated by development of fever-due to probable Streptococcus bacteremia and infected decubitus ulcerPatient is seen by ID and general surgery.Patient completed antibiotics.  Continues to remain deconditioned weak and frail.  PAD PAD completed 5/31-LDL denied SNF approved.

## 2022-04-28 NOTE — Progress Notes (Signed)
Physical Therapy Wound Treatment Patient Details  Name: Christian [name] MRN: [id number] Date of Birth: [birth year]  Today's Date: 04/28/2022 Time: 7209-4709 Time Calculation (min): 56 min  Subjective  Subjective Assessment Subjective: Pt stating "ouch" repeatedly during session. Patient and Family Stated Goals: agreeable to session Date of Onset:  (Unknown) Prior Treatments: Dressing changes  Pain Score:  Pre-medicated, but pt with poor pain tolerance, stating "ouch" repeatedly during session  Wound Assessment  Pressure Injury 03/31/22 Sacrum Mid Unstageable - Full thickness tissue loss in which the base of the injury is covered by slough (yellow, tan, gray, green or brown) and/or eschar (tan, brown or black) in the wound bed. (Active)  Wound Image     04/28/22 1733  Dressing Type Foam - Lift dressing to assess site every shift;Silver hydrofiber;Barrier Film (skin prep) 04/28/22 1733  Dressing Clean, Dry, Intact;Changed 04/28/22 1733  Dressing Change Frequency Twice a day 04/28/22 1733  State of Healing Early/partial granulation 04/28/22 1733  Site / Wound Assessment Painful;Yellow;Pink;Red 04/28/22 1733  % Wound base Red or Granulating 85% 04/28/22 1733  % Wound base Yellow/Fibrinous Exudate 15% 04/28/22 1733  % Wound base Black/Eschar 0% 04/28/22 1733  % Wound base Other/Granulation Tissue (Comment) 0% 04/28/22 1733  Peri-wound Assessment Purple;Pink 04/28/22 1733  Wound Length (cm) 9 cm 04/28/22 1733  Wound Width (cm) 9 cm 04/28/22 1733  Wound Depth (cm) 2.5 cm 04/28/22 1733  Wound Surface Area (cm^2) 81 cm^2 04/28/22 1733  Wound Volume (cm^3) 202.5 cm^3 04/28/22 1733  Tunneling (cm) 7.2 at ~7:00 04/28/22 1733  Undermining (cm) 2 from 7:00-9:00, 1.5 from 11:00-12:00, 2.25 cm at 12:00, 0.75 cm 12:00-1:00 04/28/22 1733  Margins Unattached edges (unapproximated) 04/28/22 1733  Drainage Amount Copious 04/28/22 1733  Drainage Description Serosanguineous;Purulent 04/28/22 1733   Treatment Debridement (Selective);Irrigation;Other (Comment) 04/28/22 1733   Hydrotherapy Pulsed lavage therapy - wound location: Sacrum Pulsed Lavage with Suction (psi): 4 psi (started with 8-12 psi but pt reported pain, thus reduced to 4) Pulsed Lavage with Suction - Normal Saline Used: 1000 mL Pulsed Lavage Tip: Tip with splash shield Selective Debridement Selective Debridement - Location: Sacrum Selective Debridement - Tools Used: Forceps, Scalpel Selective Debridement - Tissue Removed: Yellow nectrotic tissue    Wound Assessment and Plan  Wound Therapy - Assess/Plan/Recommendations Wound Therapy - Clinical Statement: Pt pre-medicated, but still stating "ouch" repeatedly during session. Secondary to his poor pain tolerance, decided to perform irrigation rather than pulsed lavage. Noted bone apparent at inferior aspect of wound. There continues to be large serous to light yellow drainage noted in the tunnel. However, there was very little amount of nonviable tissue that could be removed today with selective debridement. It appears pt's wound is at a plateau with hydrotherapy. Assessed wound with WOC RN Maudie Flakes. Confirmed with Margarita Grizzle that plan will be to transition pt to Aquacel Ag and hydrotherapy will follow-up with pt 2x next week and then reduce to 1x/week to ensure wound continues to make good progress with this change in POC. Wound Therapy - Functional Problem List: Global weakness; apparently had not been out of bed since he got back to ALF from SNF ~1 week (PTA). Factors Delaying/Impairing Wound Healing: Diabetes Mellitus, Incontinence, Infection - systemic/local, Immobility, Multiple medical problems Hydrotherapy Plan: Debridement, Dressing change, Patient/family education, Pulsatile lavage with suction Wound Therapy - Frequency: Other (comment) (2x/week M & F next week) Wound Therapy - Follow Up Recommendations: dressing changes by RN  Wound Therapy Goals- Improve the  function of patient's integumentary  system by progressing the wound(s) through the phases of wound healing (inflammation - proliferation - remodeling) by: Wound Therapy Goals - Improve the function of patient's integumentary system by progressing the wound(s) through the phases of wound healing by: Decrease Necrotic Tissue to: 10 Decrease Necrotic Tissue - Progress: Progressing toward goal Increase Granulation Tissue to: 90 Increase Granulation Tissue - Progress: Progressing toward goal Improve Drainage Characteristics: Min, Serous Improve Drainage Characteristics - Progress: Progressing toward goal Goals/treatment plan/discharge plan were made with and agreed upon by patient/family: Yes Time For Goal Achievement: 7 days Wound Therapy - Potential for Goals: Fair  Goals will be updated until maximal potential achieved or discharge criteria met.  Discharge criteria: when goals achieved, discharge from hospital, MD decision/surgical intervention, no progress towards goals, refusal/missing three consecutive treatments without notification or medical reason.  GP     Charges PT Wound Care Charges $Wound Debridement up to 20 cm: < or equal to 20 cm $PT Hydrotherapy Dressing: 3 dressings $PT Hydrotherapy Visit: 1 Visit       Moishe Spice, PT, DPT Acute Rehabilitation Services  Pager: [phone] Office: [place] 04/28/2022, 5:49 PM

## 2022-04-28 NOTE — Consult Note (Signed)
WOC Nurse Consult Note: Reason for Consult:Full thickness wound. Initially followed by surgery, they signed off on 5/24.  PT plans to discontinue hydrotherapy today. Seeking input on wound care modalities to continue wicking exudate in the absence of nonviable tissue Wound type:Full thickness Pressure Injury POA: Yes Measurement:Per hydrotherapy note today Wound bed: red,, moist Drainage (amount, consistency, odor) moderate to large serous to light yellow Periwound: intact Dressing procedure/placement/frequency: WOC Nurse has consulted with PT/hydrotherapy at bedside and we together determine that discontinuation of collagenase (Santyl) is appropriate. Twice daily dressings by Nursing using silver hydrofiber (Aquacel Ag+ Advantage, [employer] # [id number]) at this time. The piece of silver hydrofiber is to be cut as an apple-core to make one contiguous piece that can be used to fill the deepest portion of the wound, act as a wick to remove pooled exudate and still be removed in one piece. Once filled to skin level, a dry dressing can be placed and an ABD pad secured with tape used as securement.  Turning and repositioning is in place and time in the supine position minimized. A mattress replacement with low air loss feature is in use.   PT will continue to follow and see twice next week and once the following week to ensure there is no deleterious outcome to the change in the POC.  Recommend follow up with the outpatient wound care center upon discharge. This was previously recommended by Surgery.   WOC nursing team will not follow, but will remain available to this patient, the nursing and medical teams.  Please re-consult if needed.   Thanks, Ladona Mow, MSN, RN, GNP, [place]  Pager# [phone]

## 2022-04-28 NOTE — Plan of Care (Signed)
  Problem: Education: Goal: Knowledge of General Education information will improve Description: Including pain rating scale, medication(s)/side effects and non-pharmacologic comfort measures Outcome: Progressing   Problem: Clinical Measurements: Goal: Ability to maintain clinical measurements within normal limits will improve Outcome: Progressing Goal: Will remain free from infection Outcome: Progressing Goal: Diagnostic test results will improve Outcome: Progressing Goal: Respiratory complications will improve Outcome: Progressing Goal: Cardiovascular complication will be avoided Outcome: Progressing   Problem: Activity: Goal: Risk for activity intolerance will decrease Outcome: Progressing   Problem: Nutrition: Goal: Adequate nutrition will be maintained Outcome: Progressing   Problem: Pain Managment: Goal: General experience of comfort will improve Outcome: Progressing   Problem: Safety: Goal: Ability to remain free from injury will improve Outcome: Progressing   Problem: Skin Integrity: Goal: Risk for impaired skin integrity will decrease Outcome: Progressing   

## 2022-04-29 DIAGNOSIS — E875 Hyperkalemia: Secondary | ICD-10-CM | POA: Diagnosis not present

## 2022-04-29 LAB — GLUCOSE, CAPILLARY
Glucose-Capillary: 103 mg/dL — ABNORMAL HIGH (ref 70–99)
Glucose-Capillary: 153 mg/dL — ABNORMAL HIGH (ref 70–99)
Glucose-Capillary: 265 mg/dL — ABNORMAL HIGH (ref 70–99)
Glucose-Capillary: 182 mg/dL — ABNORMAL HIGH (ref 70–99)

## 2022-04-29 MED ORDER — JUVEN PO PACK: 1.0000 | PACK | Freq: Two times a day (BID) | ORAL | 0 refills | Status: DC

## 2022-04-29 MED ORDER — ENSURE ENLIVE PO LIQD: 237.0000 mL | Freq: Three times a day (TID) | ORAL | 12 refills | Status: DC

## 2022-04-29 MED ORDER — BENZTROPINE MESYLATE 1 MG PO TABS: 1.0000 mg | ORAL_TABLET | Freq: Two times a day (BID) | ORAL | Status: AC

## 2022-04-29 MED ORDER — INSULIN GLARGINE-YFGN 100 UNIT/ML ~~LOC~~ SOLN: 10.0000 [IU] | Freq: Every day | SUBCUTANEOUS | 11 refills | Status: DC

## 2022-04-29 MED ORDER — HYDROCODONE-ACETAMINOPHEN 5-325 MG PO TABS: 1.0000 | ORAL_TABLET | Freq: Four times a day (QID) | ORAL | 0 refills | Status: DC | PRN

## 2022-04-29 MED ORDER — BISACODYL 5 MG PO TBEC: 10.0000 mg | DELAYED_RELEASE_TABLET | Freq: Every day | ORAL | 0 refills | Status: AC | PRN

## 2022-04-29 MED ORDER — MELATONIN 10 MG PO TABS: 10.0000 mg | ORAL_TABLET | Freq: Every evening | ORAL | 0 refills | Status: AC | PRN

## 2022-04-29 MED ORDER — SENNOSIDES-DOCUSATE SODIUM 8.6-50 MG PO TABS: 2.0000 | ORAL_TABLET | Freq: Two times a day (BID) | ORAL | Status: AC

## 2022-04-29 MED ORDER — TORSEMIDE 20 MG PO TABS
20.0000 mg | ORAL_TABLET | Freq: Every day | ORAL | Status: DC
  Administered 2022-04-29 – 2022-05-01 (×3): 20 mg via ORAL
  Filled 2022-04-29 (×3): qty 1

## 2022-04-29 MED ORDER — ARIPIPRAZOLE 10 MG PO TABS: 10.0000 mg | ORAL_TABLET | Freq: Every morning | ORAL | Status: AC

## 2022-04-29 NOTE — Progress Notes (Signed)
PROGRESS NOTE Christian [name]  [id number] DOB: 11-10-1951 DOA: 03/30/2022 PCP: Donald Prose, MD   Brief Narrative/Hospital Course: 71 y.o.  male with a history of A-fib on Xarelto, HFpEF, PAD, DM-2, bipolar disorder-presented with generalized weakness/sacral decubitus ulcer-was found to have hypoglycemia, hyperkalemia and subsequently admitted to the hospitalist service. Patient was thought to have extrapyramidal symptoms-evaluated by psych/neurology-psych medications adjusted with clinical improvement.  Unfortunately-further hospital course complicated by development of fever-due to probable Streptococcus bacteremia and infected decubitus ulcerPatient is seen by ID and general surgery.Patient completed antibiotics.  Overall doing well 5/2>> admit to [facility] for weakness/hypoglycemia/hyperkalemia/rigidity-thought to have EPS 5/14>> febrile/tachycardic-Per nursing staff-discharge from prior existing sacral decubitus ulcer. Started on broad-spectrum IV antibiotics.  Prelim blood culture positive for Streptococcus species. 5/19>> wound care starting hydrotherapy 5/31>> discontinued hydrotherapy, wound care consulted. But PT resumed Hydrotherapy again 04/29/22.  Subjective: Seen and examined this morning.  Patient resting comfortably no new complaints. Overnight afebrile.  Saturating well on room air   Assessment and Plan: Principal Problem:   Hyperkalemia Active Problems:   Bacteremia   Diabetes mellitus type 2 in nonobese (HCC)   Sacral decubitus ulcer, stage II (HCC)   Severe sepsis without septic shock (HCC)   Hypoglycemia  Rigidity/tremor/mildly elevated CK felt to be extrapyramidal syndrome rather than NMS: Management supportive care seen by neurology psychiatry resumed Abilify and Cogentin Wellbutrin and Seroquel CIWA discontinued.  Sepsis due to possible strep bacteria-sacral decubitus ulcer with infection and underlying sacral abscess: Sepsis patient is at this time is stable and  improved.  Patient complete Zyvox 5/31 and Augmentin 5/26. Patient was getting hydrotherapy from 5/19 discontinued 5/31 but back on and will cont  x 2 wk per PT.  Acute metabolic encephalopathy due to sepsis significant disability mental status improved.  Conversant and interacting.  AKI on CKD stage IIIa in the setting of hemodynamic mediated creatinine improved to baseline.  Monitor intermittently Recent Labs  Lab 04/24/22 0404 04/25/22 0110 04/26/22 0112 04/27/22 0054 04/28/22 0211  BUN 12 13 10 18 22   CREATININE 0.83 0.81 0.94 1.00 1.20    Mild rhabdomyolysis nontraumatic, improved with supportive care. Hypokalemia and hypomagnesemia replaced. Hyponatremia likely SIADH continue fluid restriction S/P IV Lasix. Hypothyroidism continue cytomel. PAF rate controlled on Xarelto, continue metoprolol 50 daily Hypertension blood pressure controlled metoprolol PAD follow-up with neurosurgery as outpatient Chronic CHF with preserved EF 45 to 50% on recent echo. Resumed demedex- monitor. Upper extremity edema significantly improved   Bipolar disorder concern for EPS on Abilify and Cogentin ; Depakote and Wellbutrin and Seroquel OSA on CPAP nightly Left arm swelling likely due to IV infiltration could have superficial venous thrombosis left upper extremity.  Negative for DVT continue supportive care elevate the arm continue anticoagulation  Type 2 diabetes mellitus A1c 7.6 on 4/15 blood sugar well controlled on sliding. And semglee 10 units Recent Labs  Lab 04/28/22 1230 04/28/22 1640 04/28/22 2205 04/29/22 0805 04/29/22 1143  GLUCAP 104* 237* 193* 103* 153*     Debility/deconditioning in the setting of chronic illness.  Per family he has been nonambulatory since his discharge from hospital hospital in April 2023. Hypoglycemia resolved with supportive care.  DO NOT Resume Amaryl on discharge Class i Obesity:Patient's Body mass index is 32.83 kg/m. : Will benefit with PCP follow-up,  weight loss  healthy lifestyle.   DVT prophylaxis: Xarelto Code Status:   Code Status: Full Code Family Communication: plan of care discussed with patient at bedside. Patient status is: Home because of ongoing  management of wound, deconditioning Level of care: Progressive   Dispo: The patient is from: home            Anticipated disposition: SNF after completion of hydrotherapy  Mobility Assessment (last 72 hours)     Mobility Assessment     Row Name 04/29/22 0900 04/28/22 1924 04/28/22 1600 04/28/22 1200 04/28/22 0941   Does patient have an order for bedrest or is patient medically unstable No - Continue assessment (P)  Yes- Bedfast (Level 1) - Complete Yes- Bedfast (Level 1) - Complete Yes- Bedfast (Level 1) - Complete --   What is the highest level of mobility based on the progressive mobility assessment? Level 2 (Chairfast) - Balance while sitting on edge of bed and cannot stand (P)  Level 2 (Chairfast) - Balance while sitting on edge of bed and cannot stand Level 2 (Chairfast) - Balance while sitting on edge of bed and cannot stand Level 2 (Chairfast) - Balance while sitting on edge of bed and cannot stand Level 2 (Chairfast) - Balance while sitting on edge of bed and cannot stand   Is the above level different from baseline mobility prior to current illness? Yes - Recommend PT order (P)  Yes - Recommend PT order Yes - Recommend PT order Yes - Recommend PT order --    Row Name 04/28/22 0900 04/28/22 0800 04/27/22 1600 04/27/22 1200 04/27/22 0755   Does patient have an order for bedrest or is patient medically unstable -- Yes- Bedfast (Level 1) - Complete Yes- Bedfast (Level 1) - Complete Yes- Bedfast (Level 1) - Complete Yes- Bedfast (Level 1) - Complete   What is the highest level of mobility based on the progressive mobility assessment? Level 2 (Chairfast) - Balance while sitting on edge of bed and cannot stand Level 2 (Chairfast) - Balance while sitting on edge of bed and cannot stand  Level 2 (Chairfast) - Balance while sitting on edge of bed and cannot stand Level 2 (Chairfast) - Balance while sitting on edge of bed and cannot stand Level 2 (Chairfast) - Balance while sitting on edge of bed and cannot stand   Is the above level different from baseline mobility prior to current illness? -- Yes - Recommend PT order -- -- --    Row Name 04/26/22 2100           Does patient have an order for bedrest or is patient medically unstable No - Continue assessment       What is the highest level of mobility based on the progressive mobility assessment? Level 2 (Chairfast) - Balance while sitting on edge of bed and cannot stand       Is the above level different from baseline mobility prior to current illness? Yes - Recommend PT order                 Objective: Vitals last 24 hrs: Vitals:   04/29/22 0014 04/29/22 0315 04/29/22 0803 04/29/22 1141  BP: 104/70 119/73 108/80 103/71  Pulse: 98 88 86 96  Resp: 20 12 17  (!) 25  Temp: 97.9 F (36.6 C) 98.7 F (37.1 C) 98.9 F (37.2 C) 98.1 F (36.7 C)  TempSrc: Axillary Axillary Axillary Oral  SpO2: 91% 93% 93% 93%  Weight:      Height:       Weight change:   Physical Examination: General exam: AA oriented, pleasant, generalized weakness present,older than stated age, weak appearing. HEENT:Oral mucosa moist, Ear/Nose WNL grossly, dentition normal. Respiratory  system: bilaterally diminished, no use of accessory muscle Cardiovascular system: S1 & S2 +, No JVD,. Gastrointestinal system: Abdomen soft,NT,ND,BS+ Nervous System:Alert, awake, moving extremities and grossly nonfocal Extremities: LE ankle edema mild, distal peripheral pulses palpable.  Skin: No rashes,no icterus.  Sacral wound present MSK: Normal muscle bulk,tone, power   Medications reviewed:  Scheduled Meds:  ARIPiprazole  10 mg Oral q morning   atorvastatin  10 mg Oral Daily   benztropine  1 mg Oral BID   Chlorhexidine Gluconate Cloth  6 each Topical  Daily   diclofenac Sodium  2 g Topical QID   feeding supplement  237 mL Oral TID BM   insulin aspart  0-9 Units Subcutaneous TID WC   insulin glargine-yfgn  10 Units Subcutaneous Daily   liothyronine  10 mcg Oral Daily   mouth rinse  15 mL Mouth Rinse BID   metoprolol succinate  50 mg Oral Daily   multivitamin with minerals  1 tablet Oral Daily   nutrition supplement (JUVEN)  1 packet Oral BID BM   rivaroxaban  20 mg Oral Q supper   senna-docusate  2 tablet Oral BID   torsemide  20 mg Oral Daily   Continuous Infusions:  Pressure Injury 03/31/22 Sacrum Mid Unstageable - Full thickness tissue loss in which the base of the injury is covered by slough (yellow, tan, gray, green or brown) and/or eschar (tan, brown or black) in the wound bed. (Active)  03/31/22 1933  Location: Sacrum  Location Orientation: Mid  Staging: Unstageable - Full thickness tissue loss in which the base of the injury is covered by slough (yellow, tan, gray, green or brown) and/or eschar (tan, brown or black) in the wound bed.  Wound Description (Comments):   Present on Admission: Yes  Dressing Type Foam - Lift dressing to assess site every shift 04/29/22 0900   Diet Order             DIET DYS 2 Room service appropriate? No; Fluid consistency: Thin; Fluid restriction: 1500 mL Fluid  Diet effective now                    Nutrition Problem: Increased nutrient needs Etiology: chronic illness, wound healing Signs/Symptoms: estimated needs Interventions: Ensure Enlive (each supplement provides 350kcal and 20 grams of protein), Magic cup, Juven, MVI   Intake/Output Summary (Last 24 hours) at 04/29/2022 1320 Last data filed at 04/29/2022 1000 Gross per 24 hour  Intake --  Output 2275 ml  Net -2275 ml   Net IO Since Admission: -14,794.49 mL [04/29/22 1320]  Wt Readings from Last 3 Encounters:  04/17/22 109.8 kg  03/19/22 112 kg  03/11/22 117.9 kg     Unresulted Labs (From admission, onward)     Start      Ordered   04/27/22 0616  Sodium, urine, random  Once,   R        04/27/22 0615   04/27/22 0616  Osmolality, urine  Once,   R        04/27/22 0615   04/27/22 0616  Creatinine, urine, random  Once,   R        04/27/22 0615          Data Reviewed: I have personally reviewed following labs and imaging studies CBC: Recent Labs  Lab 04/23/22 0128 04/24/22 0404 04/25/22 0110 04/26/22 0112  WBC 5.0 4.3 5.0 4.6  NEUTROABS 3.3 2.7 3.2 2.7  HGB 11.3* 11.6* 11.7* 12.5*  HCT 36.2* 37.2*  36.4* 39.9  MCV 85.4 85.7 84.1 85.1  PLT 302 301 341 [id number]    Basic Metabolic Panel: Recent Labs  Lab 04/23/22 0128 04/24/22 0404 04/25/22 0110 04/26/22 0112 04/27/22 0054 04/28/22 0211  NA 131* 133* 133* 131* 131* 132*  K 4.3 4.8 4.5 4.7 4.9 4.9  CL 105 105 101 102 100 98  CO2 23 23 26 26 26 29   GLUCOSE 136* 118* 134* 100* 147* 133*  BUN 12 12 13 10 18 22   CREATININE 0.81 0.83 0.81 0.94 1.00 1.20  CALCIUM 8.4* 8.7* 9.0 9.2 9.0 9.3  MG 1.8 1.7 1.7 1.6* 2.0  --     GFR: Estimated Creatinine Clearance: 75.4 mL/min (by C-G formula based on SCr of 1.2 mg/dL). Liver Function Tests: Recent Labs  Lab 04/23/22 0128 04/24/22 0404 04/25/22 0110 04/26/22 0112  AST 54* 48* 38 27  ALT 48* 43 43 34  ALKPHOS 54 63 68 70  BILITOT 0.5 0.5 0.2* 0.2*  PROT 5.7* 5.8* 6.2* 6.7  ALBUMIN 1.5* 1.7* 1.9* 2.0*    No results for input(s): LIPASE, AMYLASE in the last 168 hours. No results for input(s): AMMONIA in the last 168 hours. Coagulation Profile: No results for input(s): INR, PROTIME in the last 168 hours. BNP (last 3 results) No results for input(s): PROBNP in the last 8760 hours. HbA1C: No results for input(s): HGBA1C in the last 72 hours. CBG: Recent Labs  Lab 04/28/22 1230 04/28/22 1640 04/28/22 2205 04/29/22 0805 04/29/22 1143  GLUCAP 104* 237* 193* 103* 153*    Lipid Profile: No results for input(s): CHOL, HDL, LDLCALC, TRIG, CHOLHDL, LDLDIRECT in the last 72 hours. Thyroid  Function Tests: No results for input(s): TSH, T4TOTAL, FREET4, T3FREE, THYROIDAB in the last 72 hours. Sepsis Labs: No results for input(s): PROCALCITON, LATICACIDVEN in the last 168 hours.   No results found for this or any previous visit (from the past 240 hour(s)).  Antimicrobials: Anti-infectives (From admission, onward)    Start     Dose/Rate Route Frequency Ordered Stop   04/22/22 1015  linezolid (ZYVOX) tablet 600 mg        600 mg Oral Every 12 hours 04/22/22 0916 04/26/22 2110   04/15/22 0900  vancomycin (VANCOREADY) IVPB 1500 mg/300 mL  Status:  Discontinued        1,500 mg 150 mL/hr over 120 Minutes Intravenous Every 24 hours 04/14/22 0811 04/14/22 1005   04/14/22 2200  amoxicillin-clavulanate (AUGMENTIN) 875-125 MG per tablet 1 tablet        1 tablet Oral Every 12 hours 04/14/22 1331 04/23/22 2148   04/14/22 1100  DAPTOmycin (CUBICIN) 700 mg in sodium chloride 0.9 % IVPB  Status:  Discontinued        8 mg/kg  90.4 kg (Adjusted) 128 mL/hr over 30 Minutes Intravenous Daily 04/14/22 1005 04/22/22 0916   04/14/22 0900  vancomycin (VANCOREADY) IVPB 2000 mg/400 mL  Status:  Discontinued        2,000 mg 200 mL/hr over 120 Minutes Intravenous  Once 04/14/22 0811 04/14/22 1005   04/13/22 2200  metroNIDAZOLE (FLAGYL) tablet 500 mg  Status:  Discontinued        500 mg Oral Every 12 hours 04/13/22 1436 04/14/22 1331   04/12/22 0815  cefTRIAXone (ROCEPHIN) 2 g in sodium chloride 0.9 % 100 mL IVPB  Status:  Discontinued        2 g 200 mL/hr over 30 Minutes Intravenous Daily 04/12/22 0717 04/14/22 0811   04/12/22 0600  vancomycin (  VANCOREADY) IVPB 1250 mg/250 mL  Status:  Discontinued        1,250 mg 166.7 mL/hr over 90 Minutes Intravenous Every 24 hours 04/11/22 0521 04/12/22 0717   04/11/22 1145  metroNIDAZOLE (FLAGYL) IVPB 500 mg  Status:  Discontinued        500 mg 100 mL/hr over 60 Minutes Intravenous Every 12 hours 04/11/22 1055 04/13/22 1436   04/11/22 0600  ceFEPIme (MAXIPIME)  2 g in sodium chloride 0.9 % 100 mL IVPB  Status:  Discontinued        2 g 200 mL/hr over 30 Minutes Intravenous Every 8 hours 04/11/22 0437 04/12/22 0717   04/11/22 0530  vancomycin (VANCOREADY) IVPB 2000 mg/400 mL        2,000 mg 200 mL/hr over 120 Minutes Intravenous  Once 04/11/22 0437 04/11/22 2008      Culture/Microbiology    Component Value Date/Time   SDES BLOOD LEFT HAND 04/12/2022 1222   SPECREQUEST  04/12/2022 1222    BOTTLES DRAWN AEROBIC ONLY Blood Culture results may not be optimal due to an inadequate volume of blood received in culture bottles   CULT  04/12/2022 1222    NO GROWTH 5 DAYS Performed at [facility] [address] [place], [place] [address]    REPTSTATUS 04/17/2022 FINAL 04/12/2022 1222  Radiology Studies: No results found.   LOS: 60 days   Antonieta Pert, MD [facility]  04/29/2022, 1:20 PM

## 2022-04-29 NOTE — TOC Progression Note (Addendum)
Transition of Care [facility]) - Progression Note    Patient Details  Name: Christian [name] MRN: [id number] Date of Birth: Oct 15, 1951  [facility] CM/SW Contact  Mearl Latin, LCSW Phone Number: 04/29/2022, 9:58 AM  Clinical Narrative:    9:58am-CSW updated patient's daughter of LTACH denial and plan to re-start insurance authorization with [facility]. Daughter requested medical updates and to know why hydrotherapy was stopped. CSW made MD aware and he requested PT or WOC update daughter. CSW made request.    12pm-PT updated CSW that they would like to assess patient's wound two times next week to make sure it is healing without the need for hydrotherapy. CSW updated MD and Daughter. Jonita Albee will restart auth once medically ready.    Expected Discharge Plan: Skilled Nursing Facility Barriers to Discharge: Insurance Authorization  Expected Discharge Plan and Services Expected Discharge Plan: Skilled Nursing Facility In-house Referral: Clinical Social Work   Post Acute Care Choice: Skilled Nursing Facility Living arrangements for the past 2 months: Single Family Home, Skilled Nursing Facility                                       Social Determinants of Health (SDOH) Interventions    Readmission Risk Interventions    04/13/2022    3:06 PM 04/13/2022   10:43 AM 01/28/2022   10:17 AM  Readmission Risk Prevention Plan  Transportation Screening  Complete Complete  PCP or Specialist Appt within 5-7 Days   Complete  PCP or Specialist Appt within 3-5 Days Complete    Home Care Screening   Complete  Medication Review (RN CM)   Complete  HRI or Home Care Consult Complete    Social Work Consult for Recovery Care Planning/Counseling Complete    Palliative Care Screening Not Applicable    Medication Review Oceanographer) Referral to Pharmacy Complete   PCP or Specialist appointment within 3-5 days of discharge  Complete   HRI or Home Care Consult  Complete   SW  Recovery Care/Counseling Consult  Complete   Palliative Care Screening  Not Applicable   Skilled Nursing Facility  Complete

## 2022-04-29 NOTE — Plan of Care (Signed)
  Problem: Education: Goal: Knowledge of General Education information will improve Description: Including pain rating scale, medication(s)/side effects and non-pharmacologic comfort measures Outcome: Progressing   Problem: Clinical Measurements: Goal: Ability to maintain clinical measurements within normal limits will improve Outcome: Progressing Goal: Will remain free from infection Outcome: Progressing Goal: Diagnostic test results will improve Outcome: Progressing Goal: Respiratory complications will improve Outcome: Progressing Goal: Cardiovascular complication will be avoided Outcome: Progressing   Problem: Activity: Goal: Risk for activity intolerance will decrease Outcome: Progressing   Problem: Nutrition: Goal: Adequate nutrition will be maintained Outcome: Progressing   Problem: Pain Managment: Goal: General experience of comfort will improve Outcome: Progressing   Problem: Safety: Goal: Ability to remain free from injury will improve Outcome: Progressing   Problem: Skin Integrity: Goal: Risk for impaired skin integrity will decrease Outcome: Progressing   

## 2022-04-30 DIAGNOSIS — E875 Hyperkalemia: Secondary | ICD-10-CM | POA: Diagnosis not present

## 2022-04-30 LAB — GLUCOSE, CAPILLARY
Glucose-Capillary: 109 mg/dL — ABNORMAL HIGH (ref 70–99)
Glucose-Capillary: 210 mg/dL — ABNORMAL HIGH (ref 70–99)
Glucose-Capillary: 209 mg/dL — ABNORMAL HIGH (ref 70–99)
Glucose-Capillary: 265 mg/dL — ABNORMAL HIGH (ref 70–99)

## 2022-04-30 LAB — BASIC METABOLIC PANEL
Anion gap: 10 (ref 5–15)
BUN: 27 mg/dL — ABNORMAL HIGH (ref 8–23)
CO2: 25 mmol/L (ref 22–32)
Calcium: 9.4 mg/dL (ref 8.9–10.3)
Chloride: 98 mmol/L (ref 98–111)
Creatinine, Ser: 1.02 mg/dL (ref 0.61–1.24)
GFR, Estimated: >60 mL/min (ref >60)
Glucose, Bld: 134 mg/dL — ABNORMAL HIGH (ref 70–99)
Potassium: 4.3 mmol/L (ref 3.5–5.1)
Sodium: 133 mmol/L — ABNORMAL LOW (ref 135–145)

## 2022-04-30 LAB — CBC
HCT: 39.2 % (ref 39.0–52.0)
Hemoglobin: 12.6 g/dL — ABNORMAL LOW (ref 13.0–17.0)
MCH: 27.5 pg (ref 26.0–34.0)
MCHC: 32.1 g/dL (ref 30.0–36.0)
MCV: 85.4 fL (ref 80.0–100.0)
Platelets: 310 10*3/uL (ref 150–400)
RBC: 4.59 MIL/uL (ref 4.22–5.81)
RDW: 17.4 % — ABNORMAL HIGH (ref 11.5–15.5)
WBC: 5.1 10*3/uL (ref 4.0–10.5)
nRBC: 0 % (ref 0.0–0.2)

## 2022-04-30 NOTE — Progress Notes (Signed)
Speech Language Pathology Treatment: Dysphagia  Patient Details Name: Christian [name] MRN: [id number] DOB: 1951/07/11 Today's Date: 04/30/2022 Time: 0945-1000 SLP Time Calculation (min) (ACUTE ONLY): 15 min  Assessment / Plan / Recommendation Clinical Impression  Pt reports he dislikes the dys 2 texture, that the food is "artificial." Pt does have top denture in place and tolerates regular solids well, always requesting a liquid wash after bites. No signs of aspiration. Will upgrade to regular diet and sign off as pt is able to direct his care and make food choices independently.   HPI HPI: 71 y.o. male presented 5/2 with worsening strength , new decubitus ulcers and poor intake.  Recent admission for cellulitis and hypoglycemia, had just returned to ALF from SNF a week ago but not OOB since then. CXR shows Low lung volumes with mild atelectasis at the right lung base. No  acute infiltrate is identified.Marland Kitchen      SLP Plan  All goals met      Recommendations for follow up therapy are one component of a multi-disciplinary discharge planning process, led by the attending physician.  Recommendations may be updated based on patient status, additional functional criteria and insurance authorization.    Recommendations  Diet recommendations: Regular;Thin liquid Liquids provided via: Straw;Cup Medication Administration: Whole meds with liquid Supervision: Full supervision/cueing for compensatory strategies;Trained caregiver to feed patient Compensations: Slow rate;Small sips/bites;Minimize environmental distractions Postural Changes and/or Swallow Maneuvers: Seated upright 90 degrees                Follow Up Recommendations: No SLP follow up Plan: All goals met           Christian [name], Katherene Ponto  04/30/2022, 11:20 AM

## 2022-04-30 NOTE — Progress Notes (Signed)
Physical Therapy Treatment Patient Details Name: Christian [name] MRN: [id number] DOB: 1951-05-17 Today's Date: 04/30/2022   History of Present Illness 71 y.o. male presented 5/2 with worsening strength , new decubitus ulcers and poor intake.  Recent admission for cellulitis and hypoglycemia, had just returned to ALF from SNF a week ago but not OOB since then.  Has new hypoglyemia, elevation of K+, creatinine, CK.  Ulcer is not infected but has LE skin is also irritated and has skin breakdown. LEFt UE swollen and Doppler ordered 5/15 to r/o DVT.  PMHx:  chronic venous stasis and chronic right shin unhealing wound, chronic HFpEF, IDDM, CAD with stenting x2 in 2021, PVD, CKD stage IIIa, HTN, DVT on Xarelto, bipolar disorder, OSA on CPAP at bedtime    PT Comments    Pt admitted with above diagnosis. Pt was able to sit EOB 5 min with min guard to mod assist when pt resisting sitting.  Very little participation today as pt states his butttocks hurt and he doesn't feel like working with therapy today. Will continue PT as pt allows.   Pt currently with functional limitations due to balance and endurance deficits. Pt will benefit from skilled PT to increase their independence and safety with mobility to allow discharge to the venue listed below.      Recommendations for follow up therapy are one component of a multi-disciplinary discharge planning process, led by the attending physician.  Recommendations may be updated based on patient status, additional functional criteria and insurance authorization.  Follow Up Recommendations  Skilled nursing-short term rehab (<3 hours/day)     Assistance Recommended at Discharge Frequent or constant Supervision/Assistance  Patient can return home with the following Two people to help with walking and/or transfers;Assistance with cooking/housework;Assist for transportation;Help with stairs or ramp for entrance;Two people to help with bathing/dressing/bathroom;Assistance  with feeding;Direct supervision/assist for medications management;Direct supervision/assist for financial management   Equipment Recommendations  Other (comment) (hoyer lift)    Recommendations for Other Services       Precautions / Restrictions Precautions Precautions: Fall Precaution Comments: monitor vitals Restrictions Weight Bearing Restrictions: No     Mobility  Bed Mobility Overal bed mobility: Needs Assistance Bed Mobility: Rolling, Sidelying to Sit, Sit to Sidelying Rolling: Mod assist Sidelying to sit: Max assist, +2 for physical assistance   Sit to supine: +2 for physical assistance, Total assist Sit to sidelying: +2 for physical assistance, Max assist General bed mobility comments: pt able to move each leg towards EOB prior to rolling onto his side with assist; assist to move legs off the bed and to raise torso, however pt assisting with both UEs on rail (and then with LUE reaching towards footboard; Pt initiated movement and then backed off.  He appeared to change his mind about sitting up and resisted movement making it difficult to keep him upright.  Scratched pts back and he did sit up about 5 min with bil UE support.  He also drank orange juice while sitting.  Pt kept wanting to lie down on right to lay back down and after he drank OJ he laid on right side and refused further therapy.  pt assisted with lowering torso onto his side and with raising his legs up onto bed; +2 total to scoot to [facility].    Transfers                   General transfer comment: unable    Ambulation/Gait  General Gait Details: unable   Stairs             Wheelchair Mobility    Modified Rankin (Stroke Patients Only)       Balance Overall balance assessment: Needs assistance, History of Falls Sitting-balance support: Feet supported, Bilateral upper extremity supported Sitting balance-Leahy Scale: Poor Sitting balance - Comments: sat EOB 5 minutes  with minguard assist; pt preference for bil UE support, pt did lean forward to command and tried to retract shoulders to command.  Tried to stretch pts neck to left with pt resisting movement. Postural control: Right lateral lean                                  Cognition Arousal/Alertness: Awake/alert Behavior During Therapy: Flat affect Overall Cognitive Status: Impaired/Different from baseline Area of Impairment: Memory, Following commands, Problem solving                   Current Attention Level: Sustained Memory: Decreased short-term memory Following Commands: Follows one step commands with increased time     Problem Solving: Slow processing, Decreased initiation, Difficulty sequencing, Requires verbal cues General Comments: Patient slightly more delayed compared to last visit (?due to sleeping on arrival); pt resistive to movements        Exercises General Exercises - Lower Extremity Ankle Circles/Pumps: Both, 5 reps, AAROM (more assist on PF than DF) Heel Slides: Both, Supine, AAROM, Other reps (comment) (2) Other Exercises Other Exercises: worked on midline posture    General Comments General comments (skin integrity, edema, etc.): VSS on RA      Pertinent Vitals/Pain Pain Assessment Pain Assessment: Faces Faces Pain Scale: Hurts whole lot Pain Location: buttocks Pain Descriptors / Indicators: Grimacing, Guarding Pain Intervention(s): Limited activity within patient's tolerance, Monitored during session, Repositioned    Home Living                          Prior Function            PT Goals (current goals can now be found in the care plan section) Acute Rehab PT Goals Patient Stated Goal: not stated Progress towards PT goals: Not progressing toward goals - comment (Pt self limiting and resisting therapy today)    Frequency    Min 2X/week      PT Plan Current plan remains appropriate    Co-evaluation PT/OT/SLP  Co-Evaluation/Treatment: Yes Reason for Co-Treatment: Complexity of the patient's impairments (multi-system involvement);For patient/therapist safety PT goals addressed during session: Mobility/safety with mobility        AM-PAC PT "6 Clicks" Mobility   Outcome Measure  Help needed turning from your back to your side while in a flat bed without using bedrails?: Total Help needed moving from lying on your back to sitting on the side of a flat bed without using bedrails?: Total Help needed moving to and from a bed to a chair (including a wheelchair)?: Total Help needed standing up from a chair using your arms (e.g., wheelchair or bedside chair)?: Total Help needed to walk in hospital room?: Total Help needed climbing 3-5 steps with a railing? : Total 6 Click Score: 6    End of Session   Activity Tolerance: Patient limited by pain;Patient limited by fatigue Patient left: in bed;with call bell/phone within reach Nurse Communication: Mobility status (positioned on pts right side with prevalon boots in place)  PT Visit Diagnosis: Unsteadiness on feet (R26.81);History of falling (Z91.81);Repeated falls (R29.6);Difficulty in walking, not elsewhere classified (R26.2)     Time: 6759-1638 PT Time Calculation (min) (ACUTE ONLY): 23 min  Charges:  $Therapeutic Activity: 8-22 mins                     Davey Limas M,PT Acute Rehab Services [phone] (pager)    Bevelyn Buckles 04/30/2022, 9:56 AM

## 2022-04-30 NOTE — Progress Notes (Signed)
PROGRESS NOTE Christian [name]  [id number] DOB: 09/07/1951 DOA: 03/30/2022 PCP: Donald Prose, MD   Brief Narrative/Hospital Course: 71 y.o.  male with a history of A-fib on Xarelto, HFpEF, PAD, DM-2, bipolar disorder-presented with generalized weakness/sacral decubitus ulcer-was found to have hypoglycemia, hyperkalemia and subsequently admitted to the hospitalist service. Patient was thought to have extrapyramidal symptoms-evaluated by psych/neurology-psych medications adjusted with clinical improvement.  Unfortunately-further hospital course complicated by development of fever-due to probable Streptococcus bacteremia and infected decubitus ulcerPatient is seen by ID and general surgery.Patient completed antibiotics.  Overall doing well 5/2>> admit to [facility] for weakness/hypoglycemia/hyperkalemia/rigidity-thought to have EPS 5/14>> febrile/tachycardic-Per nursing staff-discharge from prior existing sacral decubitus ulcer. Started on broad-spectrum IV antibiotics.  Prelim blood culture positive for Streptococcus species. 5/19>> wound care starting hydrotherapy 5/31>> discontinued hydrotherapy, wound care consulted. But PT resumed Hydrotherapy again 04/29/22.  Subjective: Seen examined this am Resting well. Alert awake and no complaints Overnight afebrile.  Blood pressure stable.     Assessment and Plan: Principal Problem:   Hyperkalemia Active Problems:   Bacteremia   Diabetes mellitus type 2 in nonobese (HCC)   Sacral decubitus ulcer, stage II (HCC)   Severe sepsis without septic shock (HCC)   Hypoglycemia  Rigidity/tremor/mildly elevated CK felt to be extrapyramidal syndrome rather than NMS: Management with supportive care, seen by neurology psychiatry.  At this time patient is stable and EPS resolved.  Patient is back on Abilify and Cogentin Wellbutrin and tolerating.  Off Seroquel.    Sepsis due to possible strep bacteria-sacral decubitus ulcer with infection and underlying sacral  abscess: Sepsis patient is at this time is stable and improved.  Patient complete Zyvox 5/31 and Augmentin 5/26.  As patient was getting hydrotherapy from 5/19 discontinued 5/31 but back on and will cont  x 2 wk PT wound care.  Plan for skilled nursing facility after completion of hydrotherapy x2 weeks  Acute metabolic encephalopathy due to sepsis significant disability mental status improved.  Alert awake and interactive  AKI on CKD stage IIIa in the setting of hemodynamic mediated creatinine improved to baseline.  Recent Labs  Lab 04/25/22 0110 04/26/22 0112 04/27/22 0054 04/28/22 0211 04/30/22 0310  BUN 13 10 18 22  27*  CREATININE 0.81 0.94 1.00 1.20 1.02    Mild rhabdomyolysis nontraumatic, resolved. Hypokalemia and hypomagnesemia last visit lites were stable Hyponatremia likely SIADH continue fluid restriction S/P IV Lasix.Sodium is stable at 133 Hypothyroidism continue cytomel. PAF rate controlled, continue on Xarelto, and metoprolol.,  Monitoring telemetry.   Hypertension BP controlled on metoprolol PAD follow-up with vascular as outpatient Chronic CHF with preserved EF 45 to 50% on recent echo. Resumed demedex 04/29/22- monitor. Upper extremity edema significantly improved . Net IO Since Admission: -16,434.49 mL [04/30/22 0953]   Bipolar disorder concern for EPS on Abilify and Cogentin ; Depakote and Wellbutrin and Seroquel OSA on CPAP nightly Left arm swelling likely due to IV infiltration could have superficial venous thrombosis left upper extremity.  Negative for DVT continue supportive care elevate the arm continue anticoagulation  Type 2 diabetes mellitus A1c 7.6 on 4/15, currently well controlled on SSI and semglee 10 units Recent Labs  Lab 04/29/22 0805 04/29/22 1143 04/29/22 1614 04/29/22 2053 04/30/22 0804  GLUCAP 103* 153* 265* 182* 109*    Debility/deconditioning in the setting of chronic illness.  Per family he has been nonambulatory since his discharge from  hospital hospital in April 2023.  Patient will need a skilled nursing facility after completion of wound care.  Hypoglycemia resolved with supportive care.  DO NOT Resume Amaryl on discharge Class I Obesity:Patient's Body mass index is 32.83 kg/m. : Will benefit with PCP follow-up, weight loss  healthy lifestyle.   DVT prophylaxis: Xarelto Code Status:   Code Status: Full Code Family Communication: plan of care discussed with patient at bedside. Patient status is: Home because of ongoing management of wound, deconditioning Level of care: Progressive   Dispo: The patient is from: home            Anticipated disposition: SNF after completion of hydrotherapy  Mobility Assessment (last 72 hours)     Mobility Assessment     Row Name 04/29/22 1915 04/29/22 0900 04/28/22 1924 04/28/22 1600 04/28/22 1200   Does patient have an order for bedrest or is patient medically unstable No - Continue assessment No - Continue assessment Yes- Bedfast (Level 1) - Complete Yes- Bedfast (Level 1) - Complete Yes- Bedfast (Level 1) - Complete   What is the highest level of mobility based on the progressive mobility assessment? Level 2 (Chairfast) - Balance while sitting on edge of bed and cannot stand Level 2 (Chairfast) - Balance while sitting on edge of bed and cannot stand Level 2 (Chairfast) - Balance while sitting on edge of bed and cannot stand Level 2 (Chairfast) - Balance while sitting on edge of bed and cannot stand Level 2 (Chairfast) - Balance while sitting on edge of bed and cannot stand   Is the above level different from baseline mobility prior to current illness? Yes - Recommend PT order Yes - Recommend PT order Yes - Recommend PT order Yes - Recommend PT order Yes - Recommend PT order    Row Name 04/28/22 0941 04/28/22 0900 04/28/22 0800 04/27/22 1600 04/27/22 1200   Does patient have an order for bedrest or is patient medically unstable -- -- Yes- Bedfast (Level 1) - Complete Yes- Bedfast (Level 1)  - Complete Yes- Bedfast (Level 1) - Complete   What is the highest level of mobility based on the progressive mobility assessment? Level 2 (Chairfast) - Balance while sitting on edge of bed and cannot stand Level 2 (Chairfast) - Balance while sitting on edge of bed and cannot stand Level 2 (Chairfast) - Balance while sitting on edge of bed and cannot stand Level 2 (Chairfast) - Balance while sitting on edge of bed and cannot stand Level 2 (Chairfast) - Balance while sitting on edge of bed and cannot stand   Is the above level different from baseline mobility prior to current illness? -- -- Yes - Recommend PT order -- --             Objective: Vitals last 24 hrs: Vitals:   04/29/22 1932 04/29/22 2309 04/30/22 0317 04/30/22 0808  BP: 102/69 102/76 97/68 91/70   Pulse: 98 97 100 87  Resp: 19 20 20 20   Temp: 98.3 F (36.8 C) 98.2 F (36.8 C) 98.1 F (36.7 C) 98 F (36.7 C)  TempSrc: Axillary Oral Oral Oral  SpO2: 97% 93% 94% 94%  Weight:      Height:       Weight change:   Physical Examination: General exam: AA o at baseline, on RA, older than stated age, weak appearing. HEENT:Oral mucosa moist, Ear/Nose WNL grossly, dentition normal. Respiratory system: bilaterally diminished, no use of accessory muscle Cardiovascular system: S1 & S2 +, No JVD,. Gastrointestinal system: Abdomen soft,NT,ND,BS+ Nervous System:Alert, awake, moving extremities and grossly nonfocal Extremities: LE ankle edema none, distal peripheral  pulses palpable.  Skin: No rashes,no icterus. MSK: Normal muscle bulk,tone, power   Medications reviewed:  Scheduled Meds:  ARIPiprazole  10 mg Oral q morning   atorvastatin  10 mg Oral Daily   benztropine  1 mg Oral BID   Chlorhexidine Gluconate Cloth  6 each Topical Daily   diclofenac Sodium  2 g Topical QID   feeding supplement  237 mL Oral TID BM   insulin aspart  0-9 Units Subcutaneous TID WC   insulin glargine-yfgn  10 Units Subcutaneous Daily   liothyronine   10 mcg Oral Daily   mouth rinse  15 mL Mouth Rinse BID   metoprolol succinate  50 mg Oral Daily   multivitamin with minerals  1 tablet Oral Daily   nutrition supplement (JUVEN)  1 packet Oral BID BM   rivaroxaban  20 mg Oral Q supper   senna-docusate  2 tablet Oral BID   torsemide  20 mg Oral Daily   Continuous Infusions:  Pressure Injury 03/31/22 Sacrum Mid Unstageable - Full thickness tissue loss in which the base of the injury is covered by slough (yellow, tan, gray, green or brown) and/or eschar (tan, brown or black) in the wound bed. (Active)  03/31/22 1933  Location: Sacrum  Location Orientation: Mid  Staging: Unstageable - Full thickness tissue loss in which the base of the injury is covered by slough (yellow, tan, gray, green or brown) and/or eschar (tan, brown or black) in the wound bed.  Wound Description (Comments):   Present on Admission: Yes  Dressing Type Other (Comment) 04/30/22 0321   Diet Order             DIET DYS 2 Room service appropriate? No; Fluid consistency: Thin; Fluid restriction: 1500 mL Fluid  Diet effective now                    Nutrition Problem: Increased nutrient needs Etiology: chronic illness, wound healing Signs/Symptoms: estimated needs Interventions: Ensure Enlive (each supplement provides 350kcal and 20 grams of protein), Magic cup, Juven, MVI   Intake/Output Summary (Last 24 hours) at 04/30/2022 0953 Last data filed at 04/30/2022 0809 Gross per 24 hour  Intake --  Output 2450 ml  Net -2450 ml   Net IO Since Admission: -16,434.49 mL [04/30/22 0953]  Wt Readings from Last 3 Encounters:  04/17/22 109.8 kg  03/19/22 112 kg  03/11/22 117.9 kg     Unresulted Labs (From admission, onward)     Start     Ordered   04/27/22 0616  Sodium, urine, random  Once,   R        04/27/22 0615   04/27/22 0616  Osmolality, urine  Once,   R        04/27/22 0615   04/27/22 0616  Creatinine, urine, random  Once,   R        04/27/22 0615           Data Reviewed: I have personally reviewed following labs and imaging studies CBC: Recent Labs  Lab 04/24/22 0404 04/25/22 0110 04/26/22 0112 04/30/22 0310  WBC 4.3 5.0 4.6 5.1  NEUTROABS 2.7 3.2 2.7  --   HGB 11.6* 11.7* 12.5* 12.6*  HCT 37.2* 36.4* 39.9 39.2  MCV 85.7 84.1 85.1 85.4  PLT 301 341 320 [id number]   Basic Metabolic Panel: Recent Labs  Lab 04/24/22 0404 04/25/22 0110 04/26/22 0112 04/27/22 0054 04/28/22 0211 04/30/22 0310  NA 133* 133* 131* 131* 132* 133*  K 4.8 4.5 4.7 4.9 4.9 4.3  CL 105 101 102 100 98 98  CO2 23 26 26 26 29 25   GLUCOSE 118* 134* 100* 147* 133* 134*  BUN 12 13 10 18 22  27*  CREATININE 0.83 0.81 0.94 1.00 1.20 1.02  CALCIUM 8.7* 9.0 9.2 9.0 9.3 9.4  MG 1.7 1.7 1.6* 2.0  --   --    GFR: Estimated Creatinine Clearance: 88.7 mL/min (by C-G formula based on SCr of 1.02 mg/dL). Liver Function Tests: Recent Labs  Lab 04/24/22 0404 04/25/22 0110 04/26/22 0112  AST 48* 38 27  ALT 43 43 34  ALKPHOS 63 68 70  BILITOT 0.5 0.2* 0.2*  PROT 5.8* 6.2* 6.7  ALBUMIN 1.7* 1.9* 2.0*   No results for input(s): LIPASE, AMYLASE in the last 168 hours. No results for input(s): AMMONIA in the last 168 hours. Coagulation Profile: No results for input(s): INR, PROTIME in the last 168 hours. BNP (last 3 results) No results for input(s): PROBNP in the last 8760 hours. HbA1C: No results for input(s): HGBA1C in the last 72 hours. CBG: Recent Labs  Lab 04/29/22 0805 04/29/22 1143 04/29/22 1614 04/29/22 2053 04/30/22 0804  GLUCAP 103* 153* 265* 182* 109*   Lipid Profile: No results for input(s): CHOL, HDL, LDLCALC, TRIG, CHOLHDL, LDLDIRECT in the last 72 hours. Thyroid Function Tests: No results for input(s): TSH, T4TOTAL, FREET4, T3FREE, THYROIDAB in the last 72 hours. Sepsis Labs: No results for input(s): PROCALCITON, LATICACIDVEN in the last 168 hours.   No results found for this or any previous visit (from the past 240 hour(s)).   Antimicrobials: Anti-infectives (From admission, onward)    Start     Dose/Rate Route Frequency Ordered Stop   04/22/22 1015  linezolid (ZYVOX) tablet 600 mg        600 mg Oral Every 12 hours 04/22/22 0916 04/26/22 2110   04/15/22 0900  vancomycin (VANCOREADY) IVPB 1500 mg/300 mL  Status:  Discontinued        1,500 mg 150 mL/hr over 120 Minutes Intravenous Every 24 hours 04/14/22 0811 04/14/22 1005   04/14/22 2200  amoxicillin-clavulanate (AUGMENTIN) 875-125 MG per tablet 1 tablet        1 tablet Oral Every 12 hours 04/14/22 1331 04/23/22 2148   04/14/22 1100  DAPTOmycin (CUBICIN) 700 mg in sodium chloride 0.9 % IVPB  Status:  Discontinued        8 mg/kg  90.4 kg (Adjusted) 128 mL/hr over 30 Minutes Intravenous Daily 04/14/22 1005 04/22/22 0916   04/14/22 0900  vancomycin (VANCOREADY) IVPB 2000 mg/400 mL  Status:  Discontinued        2,000 mg 200 mL/hr over 120 Minutes Intravenous  Once 04/14/22 0811 04/14/22 1005   04/13/22 2200  metroNIDAZOLE (FLAGYL) tablet 500 mg  Status:  Discontinued        500 mg Oral Every 12 hours 04/13/22 1436 04/14/22 1331   04/12/22 0815  cefTRIAXone (ROCEPHIN) 2 g in sodium chloride 0.9 % 100 mL IVPB  Status:  Discontinued        2 g 200 mL/hr over 30 Minutes Intravenous Daily 04/12/22 0717 04/14/22 0811   04/12/22 0600  vancomycin (VANCOREADY) IVPB 1250 mg/250 mL  Status:  Discontinued        1,250 mg 166.7 mL/hr over 90 Minutes Intravenous Every 24 hours 04/11/22 0521 04/12/22 0717   04/11/22 1145  metroNIDAZOLE (FLAGYL) IVPB 500 mg  Status:  Discontinued        500 mg  100 mL/hr over 60 Minutes Intravenous Every 12 hours 04/11/22 1055 04/13/22 1436   04/11/22 0600  ceFEPIme (MAXIPIME) 2 g in sodium chloride 0.9 % 100 mL IVPB  Status:  Discontinued        2 g 200 mL/hr over 30 Minutes Intravenous Every 8 hours 04/11/22 0437 04/12/22 0717   04/11/22 0530  vancomycin (VANCOREADY) IVPB 2000 mg/400 mL        2,000 mg 200 mL/hr over 120 Minutes  Intravenous  Once 04/11/22 0437 04/11/22 2008      Culture/Microbiology    Component Value Date/Time   SDES BLOOD LEFT HAND 04/12/2022 1222   SPECREQUEST  04/12/2022 1222    BOTTLES DRAWN AEROBIC ONLY Blood Culture results may not be optimal due to an inadequate volume of blood received in culture bottles   CULT  04/12/2022 1222    NO GROWTH 5 DAYS Performed at [facility] [address] [place], [place] [address]    REPTSTATUS 04/17/2022 FINAL 04/12/2022 1222  Radiology Studies: No results found.   LOS: 30 days   Antonieta Pert, MD [facility]  04/30/2022, 9:53 AM

## 2022-04-30 NOTE — Progress Notes (Signed)
Occupational Therapy Treatment Patient Details Name: Christian [name] MRN: [id number] DOB: 1951/03/03 Today's Date: 04/30/2022   History of present illness 71 y.o. male presented 5/2 with worsening strength , new decubitus ulcers and poor intake.  Recent admission for cellulitis and hypoglycemia, had just returned to ALF from SNF a week ago but not OOB since then.  Has new hypoglyemia, elevation of K+, creatinine, CK.  Ulcer is not infected but has LE skin is also irritated and has skin breakdown. LEFt UE swollen and Doppler ordered 5/15 to r/o DVT.  PMHx:  chronic venous stasis and chronic right shin unhealing wound, chronic HFpEF, IDDM, CAD with stenting x2 in 2021, PVD, CKD stage IIIa, HTN, DVT on Xarelto, bipolar disorder, OSA on CPAP at bedtime   OT comments  Pt with buttocks pain and not wanting to work with therapies today. Assisted to EOB, sat x 5 minutes and returned to supine with 2 person assist. Pt continues to be heavily reliant on B UE support in sitting.    Recommendations for follow up therapy are one component of a multi-disciplinary discharge planning process, led by the attending physician.  Recommendations may be updated based on patient status, additional functional criteria and insurance authorization.    Follow Up Recommendations  Skilled nursing-short term rehab (<3 hours/day)    Assistance Recommended at Discharge Frequent or constant Supervision/Assistance  Patient can return home with the following  Two people to help with walking and/or transfers;Two people to help with bathing/dressing/bathroom;Assistance with feeding;Assistance with cooking/housework;Direct supervision/assist for medications management;Direct supervision/assist for financial management;Assist for transportation;Help with stairs or ramp for entrance   Equipment Recommendations  Other (comment) (defer to next venue)    Recommendations for Other Services      Precautions / Restrictions  Precautions Precautions: Fall Restrictions Weight Bearing Restrictions: No       Mobility Bed Mobility Overal bed mobility: Needs Assistance Bed Mobility: Rolling, Sidelying to Sit, Sit to Sidelying Rolling: Mod assist Sidelying to sit: Max assist, +2 for physical assistance     Sit to sidelying: +2 for physical assistance, Max assist General bed mobility comments: pt with minimal effort toward advancing LEs toward EOB and raising trunk, pt able to sit up with +2 assist and tolerated x 5 minutes with distractions before insisting to lay back down    Transfers                   General transfer comment: unable     Balance     Sitting balance-Leahy Scale: Poor Sitting balance - Comments: x 5 minutes holding bed rail and foot board with min guard assist, encouraged leaning forward to drink juice                                   ADL either performed or assessed with clinical judgement   ADL Overall ADL's : Needs assistance/impaired                     Lower Body Dressing: Total assistance;Bed level Lower Body Dressing Details (indicate cue type and reason): socks                    Extremity/Trunk Assessment              Vision       Perception     Praxis      Cognition Arousal/Alertness: Awake/alert  Behavior During Therapy: Flat affect Overall Cognitive Status: Impaired/Different from baseline Area of Impairment: Memory, Following commands, Problem solving, Attention                   Current Attention Level: Sustained Memory: Decreased short-term memory Following Commands: Follows one step commands with increased time     Problem Solving: Slow processing, Decreased initiation, Difficulty sequencing, Requires verbal cues General Comments: pt not wanting to work with therapies today, citing buttocks pain        Exercises      Shoulder Instructions       General Comments      Pertinent Vitals/  Pain       Pain Assessment Pain Assessment: Faces Faces Pain Scale: Hurts whole lot Pain Location: buttocks Pain Descriptors / Indicators: Grimacing, Guarding Pain Intervention(s): Monitored during session, Repositioned  Home Living Family/patient expects to be discharged to:: Skilled nursing facility                                        Prior Functioning/Environment              Frequency  Min 2X/week        Progress Toward Goals  OT Goals(current goals can now be found in the care plan section)  Progress towards OT goals: Not progressing toward goals - comment  Acute Rehab OT Goals OT Goal Formulation: With patient Time For Goal Achievement: 05/05/22 Potential to Achieve Goals: [place] Discharge plan remains appropriate    Co-evaluation    PT/OT/SLP Co-Evaluation/Treatment: Yes Reason for Co-Treatment: Complexity of the patient's impairments (multi-system involvement)   OT goals addressed during session: Strengthening/ROM      AM-PAC OT "6 Clicks" Daily Activity     Outcome Measure   Help from another person eating meals?: A Lot Help from another person taking care of personal grooming?: Total Help from another person toileting, which includes using toliet, bedpan, or urinal?: Total Help from another person bathing (including washing, rinsing, drying)?: Total Help from another person to put on and taking off regular upper body clothing?: Total Help from another person to put on and taking off regular lower body clothing?: Total 6 Click Score: 7    End of Session    OT Visit Diagnosis: Muscle weakness (generalized) (M62.81);Pain   Activity Tolerance Patient limited by pain   Patient Left in bed;with call bell/phone within reach   Nurse Communication          Time: [id number] OT Time Calculation (min): 23 min  Charges: OT General Charges $OT Visit: 1 Visit OT Treatments $Therapeutic Activity: 8-22 mins  Nestor Lewandowsky,  OTR/L Acute Rehabilitation Services Pager: [phone] Office: [phone]   Malka So 04/30/2022, 2:01 PM

## 2022-05-01 DIAGNOSIS — E875 Hyperkalemia: Secondary | ICD-10-CM | POA: Diagnosis not present

## 2022-05-01 LAB — GLUCOSE, CAPILLARY
Glucose-Capillary: 196 mg/dL — ABNORMAL HIGH (ref 70–99)
Glucose-Capillary: 250 mg/dL — ABNORMAL HIGH (ref 70–99)
Glucose-Capillary: 174 mg/dL — ABNORMAL HIGH (ref 70–99)
Glucose-Capillary: 286 mg/dL — ABNORMAL HIGH (ref 70–99)

## 2022-05-01 MED ORDER — INSULIN GLARGINE-YFGN 100 UNIT/ML ~~LOC~~ SOLN
12.0000 [IU] | Freq: Every day | SUBCUTANEOUS | Status: DC
Start: 2022-05-01 — End: 2022-05-04
  Administered 2022-05-01 – 2022-05-04 (×4): 12 [IU] via SUBCUTANEOUS
  Filled 2022-05-01 (×4): qty 0.12

## 2022-05-01 NOTE — Progress Notes (Signed)
PROGRESS NOTE Christian [name]  [id number] DOB: [birth year] DOA: 03/30/2022 PCP: Donald Prose, MD   Brief Narrative/Hospital Course: 71 y.o.  male with a history of A-fib on Xarelto, HFpEF, PAD, DM-2, bipolar disorder-presented with generalized weakness/sacral decubitus ulcer-was found to have hypoglycemia, hyperkalemia and subsequently admitted to the hospitalist service. Patient was thought to have extrapyramidal symptoms-evaluated by psych/neurology-psych medications adjusted with clinical improvement.  Unfortunately-further hospital course complicated by development of fever-due to probable Streptococcus bacteremia and infected decubitus ulcerPatient is seen by ID and general surgery.Patient completed antibiotics.  Overall doing well 5/2>> admit to [facility] for weakness/hypoglycemia/hyperkalemia/rigidity-thought to have EPS 5/14>> febrile/tachycardic-Per nursing staff-discharge from prior existing sacral decubitus ulcer. Started on broad-spectrum IV antibiotics.  Prelim blood culture positive for Streptococcus species. 5/19>> wound care starting hydrotherapy 5/31>> discontinued hydrotherapy, wound care consulted. But PT resumed Hydrotherapy again 04/29/22.  Subjective: Seen and examined.  Pleasant alert awake able to tell me he is in the hospital but told me [facility], knows this is 2023 and able to name current president.  No new complaints. Overnight afebrile BP stable in low 100, pulse ox stable on room air Blood glucose 190s to 260s. Worked with physical therapy yesterday " was able to sit EOB 5 min with min guard to mod assist when pt resisting sitting.  Very little participation as butttocks hurt"   Assessment and Plan: Principal Problem:   Hyperkalemia Active Problems:   Bacteremia   Diabetes mellitus type 2 in nonobese ([place])   Sacral decubitus ulcer, stage II (H[facility])   Severe sepsis without septic shock (HCC)   Hypoglycemia  Rigidity/tremor/mildly elevated CK felt to be  extrapyramidal syndrome rather than NMS: seen by neurology psychiatry, and this has resolved.Patient is back on Abilify and Cogentin Wellbutrin and tolerating. Off Seroquel. Continue current supportive measures PT OT for deconditioning  Sepsis due to possible strep bacteria-sacral decubitus ulcer with infection and underlying sacral abscess: Sepsis patient is at this time is stable and improved.  Patient complete Zyvox 5/31 and Augmentin 5/26.  As patient was getting hydrotherapy from 5/19 discontinued 5/31 but back on and will cont  x 2 wk PT wound care.  Plan for skilled nursing facility after completion of hydrotherapy x2 weeks  Acute metabolic encephalopathy due to sepsis and multifactorial.  Mentation overall stable-with minimal confusion   AKI on CKD stage IIIa in the setting of hemodynamic mediated creatinine improved to baseline.  Recent Labs  Lab 04/25/22 0110 04/26/22 0112 04/27/22 0054 04/28/22 0211 04/30/22 0310  BUN 13 10 18 22  27*  CREATININE 0.81 0.94 1.00 1.20 1.02    Mild rhabdomyolysis nontraumatic, resolved. Hypokalemia and hypomagnesemia last electrolytes were stable and normal.   Hyponatremia likely SIADH continue fluid restriction S/P IV Lasix.Sodium is stable at 133 Hypothyroidism continue cytomel. PAF rate controlled, continue with Xarelto, and metoprolol.  Monitoring daily. Hypertension BP well controlled on metoprolol PAD follow-up with vascular as outpatient Chronic CHF with preserved EF 45 to 50% on recent echo. Resumed demedex 04/29/22- monitor. Upper extremity edema significantly improved . Net IO Since Admission: -18,434.49 mL [05/01/22 1026]   Bipolar disorder concern for EPS on Abilify and Cogentin ; Depakote and Wellbutrin and Seroquel OSA on CPAP nightly Left arm swelling likely due to IV infiltration could have superficial venous thrombosis left upper extremity.  Negative for DVT continue supportive care elevate the arm continue anticoagulation  Type  2 diabetes mellitus A1c 7.6 on 4/15, with uncontrolled hyperglycemia blood sugar running in the 260s.   ContSSI  and semglee increase 10> 12 unit Recent Labs  Lab 04/30/22 0804 04/30/22 1211 04/30/22 1621 04/30/22 2102 05/01/22 0750  GLUCAP 109* 210* 209* 265* 196*   Hypoglycemia resolved with supportive care.  DO NOT Resume Amaryl on discharge  Debility/deconditioning in the setting of chronic illness.  Per family he has been nonambulatory since his discharge from hospital hospital in April 2023.  Patient will need a skilled nursing facility after completion of wound care.  Class I Obesity:Patient's Body mass index is 32.83 kg/m. : Will benefit with PCP follow-up, weight loss  healthy lifestyle.   DVT prophylaxis: Xarelto Code Status:   Code Status: Full Code Family Communication: plan of care discussed with patient at bedside. Patient status is: Home because of ongoing management of wound, deconditioning Level of care: Progressive   Dispo: The patient is from: home            Anticipated disposition: SNF after completion of hydrotherapy  Mobility Assessment (last 72 hours)     Mobility Assessment     Row Name 04/30/22 2004 04/30/22 1300 04/30/22 0900 04/30/22 0800 04/29/22 1915   Does patient have an order for bedrest or is patient medically unstable No - Continue assessment -- -- No - Continue assessment No - Continue assessment   What is the highest level of mobility based on the progressive mobility assessment? Level 2 (Chairfast) - Balance while sitting on edge of bed and cannot stand Level 2 (Chairfast) - Balance while sitting on edge of bed and cannot stand Level 1 (Bedfast) - Unable to balance while sitting on edge of bed Level 2 (Chairfast) - Balance while sitting on edge of bed and cannot stand Level 2 (Chairfast) - Balance while sitting on edge of bed and cannot stand   Is the above level different from baseline mobility prior to current illness? Yes - Recommend PT order  -- -- Yes - Recommend PT order Yes - Recommend PT order    [place] Name 04/29/22 0900 04/28/22 1924 04/28/22 1600 04/28/22 1200     Does patient have an order for bedrest or is patient medically unstable No - Continue assessment Yes- Bedfast (Level 1) - Complete Yes- Bedfast (Level 1) - Complete Yes- Bedfast (Level 1) - Complete    What is the highest level of mobility based on the progressive mobility assessment? Level 2 (Chairfast) - Balance while sitting on edge of bed and cannot stand Level 2 (Chairfast) - Balance while sitting on edge of bed and cannot stand Level 2 (Chairfast) - Balance while sitting on edge of bed and cannot stand Level 2 (Chairfast) - Balance while sitting on edge of bed and cannot stand    Is the above level different from baseline mobility prior to current illness? Yes - Recommend PT order Yes - Recommend PT order Yes - Recommend PT order Yes - Recommend PT order              Objective: Vitals last 24 hrs: Vitals:   04/30/22 1910 04/30/22 2318 05/01/22 0312 05/01/22 0815  BP: 118/84 116/83 122/82 108/74  Pulse: (!) 106 (!) 109 (!) 104 (!) 101  Resp: 20 20 20 17   Temp: 98 F (36.7 C) 98 F (36.7 C) 97.9 F (36.6 C) 98 F (36.7 C)  TempSrc: Oral Oral Oral Oral  SpO2: 92% 94% 92% 91%  Weight:      Height:       Weight change:   Physical Examination: General exam: Aa0x2-3, older than stated  age, weak appearing. HEENT:Oral mucosa moist, Ear/Nose WNL grossly, dentition normal. Respiratory system: bilaterally diminished, no use of accessory muscle Cardiovascular system: S1 & S2 +, No JVD,. Gastrointestinal system: Abdomen soft,NT,ND,BS+ Nervous System:Alert, awake, moving extremities and grossly nonfocal Extremities: LE ankle edema none, distal peripheral pulses palpable.  Skin: No rashes,no icterus. MSK: Normal muscle bulk,tone, power  Sacral decubitus ulcer   Medications reviewed:  Scheduled Meds:  ARIPiprazole  10 mg Oral q morning   atorvastatin  10  mg Oral Daily   benztropine  1 mg Oral BID   Chlorhexidine Gluconate Cloth  6 each Topical Daily   diclofenac Sodium  2 g Topical QID   feeding supplement  237 mL Oral TID BM   insulin aspart  0-9 Units Subcutaneous TID WC   insulin glargine-yfgn  12 Units Subcutaneous Daily   liothyronine  10 mcg Oral Daily   mouth rinse  15 mL Mouth Rinse BID   metoprolol succinate  50 mg Oral Daily   multivitamin with minerals  1 tablet Oral Daily   nutrition supplement (JUVEN)  1 packet Oral BID BM   rivaroxaban  20 mg Oral Q supper   senna-docusate  2 tablet Oral BID   torsemide  20 mg Oral Daily   Continuous Infusions:  Pressure Injury 03/31/22 Sacrum Mid Unstageable - Full thickness tissue loss in which the base of the injury is covered by slough (yellow, tan, gray, green or brown) and/or eschar (tan, brown or black) in the wound bed. (Active)  03/31/22 1933  Location: Sacrum  Location Orientation: Mid  Staging: Unstageable - Full thickness tissue loss in which the base of the injury is covered by slough (yellow, tan, gray, green or brown) and/or eschar (tan, brown or black) in the wound bed.  Wound Description (Comments):   Present on Admission: Yes  Dressing Type Other (Comment) (Aquacel) 04/30/22 2004   Diet Order             Diet regular Room service appropriate? Yes; Fluid consistency: Thin  Diet effective now                    Nutrition Problem: Increased nutrient needs Etiology: chronic illness, wound healing Signs/Symptoms: estimated needs Interventions: Ensure Enlive (each supplement provides 350kcal and 20 grams of protein), Magic cup, Juven, MVI   Intake/Output Summary (Last 24 hours) at 05/01/2022 1026 Last data filed at 05/01/2022 0416 Gross per 24 hour  Intake --  Output 2000 ml  Net -2000 ml   Net IO Since Admission: -18,434.49 mL [05/01/22 1026]  Wt Readings from Last 3 Encounters:  04/17/22 109.8 kg  03/19/22 112 kg  03/11/22 117.9 kg     Unresulted Labs  (From admission, onward)     Start     Ordered   05/02/22 [id number]  Basic metabolic panel  Tomorrow morning,   R       Question:  Specimen collection method  Answer:  Lab=Lab collect   05/01/22 0821   05/02/22 0500  CBC  Tomorrow morning,   R       Question:  Specimen collection method  Answer:  Lab=Lab collect   05/01/22 0821   04/27/22 0616  Sodium, urine, random  Once,   R        04/27/22 0615   04/27/22 0616  Osmolality, urine  Once,   R        04/27/22 0615   04/27/22 0616  Creatinine, urine, random  Once,  R        04/27/22 0615          Data Reviewed: I have personally reviewed following labs and imaging studies CBC: Recent Labs  Lab 04/25/22 0110 04/26/22 0112 04/30/22 0310  WBC 5.0 4.6 5.1  NEUTROABS 3.2 2.7  --   HGB 11.7* 12.5* 12.6*  HCT 36.4* 39.9 39.2  MCV 84.1 85.1 85.4  PLT 341 320 [id number]   Basic Metabolic Panel: Recent Labs  Lab 04/25/22 0110 04/26/22 0112 04/27/22 0054 04/28/22 0211 04/30/22 0310  NA 133* 131* 131* 132* 133*  K 4.5 4.7 4.9 4.9 4.3  CL 101 102 100 98 98  CO2 26 26 26 29 25   GLUCOSE 134* 100* 147* 133* 134*  BUN 13 10 18 22  27*  CREATININE 0.81 0.94 1.00 1.20 1.02  CALCIUM 9.0 9.2 9.0 9.3 9.4  MG 1.7 1.6* 2.0  --   --    GFR: Estimated Creatinine Clearance: 88.7 mL/min (by C-G formula based on SCr of 1.02 mg/dL). Liver Function Tests: Recent Labs  Lab 04/25/22 0110 04/26/22 0112  AST 38 27  ALT 43 34  ALKPHOS 68 70  BILITOT 0.2* 0.2*  PROT 6.2* 6.7  ALBUMIN 1.9* 2.0*   No results for input(s): LIPASE, AMYLASE in the last 168 hours. No results for input(s): AMMONIA in the last 168 hours. Coagulation Profile: No results for input(s): INR, PROTIME in the last 168 hours. BNP (last 3 results) No results for input(s): PROBNP in the last 8760 hours. HbA1C: No results for input(s): HGBA1C in the last 72 hours. CBG: Recent Labs  Lab 04/30/22 0804 04/30/22 1211 04/30/22 1621 04/30/22 2102 05/01/22 0750  GLUCAP 109*  210* 209* 265* 196*   Lipid Profile: No results for input(s): CHOL, HDL, LDLCALC, TRIG, CHOLHDL, LDLDIRECT in the last 72 hours. Thyroid Function Tests: No results for input(s): TSH, T4TOTAL, FREET4, T3FREE, THYROIDAB in the last 72 hours. Sepsis Labs: No results for input(s): PROCALCITON, LATICACIDVEN in the last 168 hours.   No results found for this or any previous visit (from the past 240 hour(s)).  Antimicrobials: Anti-infectives (From admission, onward)    Start     Dose/Rate Route Frequency Ordered Stop   04/22/22 1015  linezolid (ZYVOX) tablet 600 mg        600 mg Oral Every 12 hours 04/22/22 0916 04/26/22 2110   04/15/22 0900  vancomycin (VANCOREADY) IVPB 1500 mg/300 mL  Status:  Discontinued        1,500 mg 150 mL/hr over 120 Minutes Intravenous Every 24 hours 04/14/22 0811 04/14/22 1005   04/14/22 2200  amoxicillin-clavulanate (AUGMENTIN) 875-125 MG per tablet 1 tablet        1 tablet Oral Every 12 hours 04/14/22 1331 04/23/22 2148   04/14/22 1100  DAPTOmycin (CUBICIN) 700 mg in sodium chloride 0.9 % IVPB  Status:  Discontinued        8 mg/kg  90.4 kg (Adjusted) 128 mL/hr over 30 Minutes Intravenous Daily 04/14/22 1005 04/22/22 0916   04/14/22 0900  vancomycin (VANCOREADY) IVPB 2000 mg/400 mL  Status:  Discontinued        2,000 mg 200 mL/hr over 120 Minutes Intravenous  Once 04/14/22 0811 04/14/22 1005   04/13/22 2200  metroNIDAZOLE (FLAGYL) tablet 500 mg  Status:  Discontinued        500 mg Oral Every 12 hours 04/13/22 1436 04/14/22 1331   04/12/22 0815  cefTRIAXone (ROCEPHIN) 2 g in sodium chloride 0.9 % 100 mL IVPB  Status:  Discontinued        2 g 200 mL/hr over 30 Minutes Intravenous Daily 04/12/22 0717 04/14/22 0811   04/12/22 0600  vancomycin (VANCOREADY) IVPB 1250 mg/250 mL  Status:  Discontinued        1,250 mg 166.7 mL/hr over 90 Minutes Intravenous Every 24 hours 04/11/22 0521 04/12/22 0717   04/11/22 1145  metroNIDAZOLE (FLAGYL) IVPB 500 mg  Status:   Discontinued        500 mg 100 mL/hr over 60 Minutes Intravenous Every 12 hours 04/11/22 1055 04/13/22 1436   04/11/22 0600  ceFEPIme (MAXIPIME) 2 g in sodium chloride 0.9 % 100 mL IVPB  Status:  Discontinued        2 g 200 mL/hr over 30 Minutes Intravenous Every 8 hours 04/11/22 0437 04/12/22 0717   04/11/22 0530  vancomycin (VANCOREADY) IVPB 2000 mg/400 mL        2,000 mg 200 mL/hr over 120 Minutes Intravenous  Once 04/11/22 0437 04/11/22 2008      Culture/Microbiology    Component Value Date/Time   SDES BLOOD LEFT HAND 04/12/2022 1222   SPECREQUEST  04/12/2022 1222    BOTTLES DRAWN AEROBIC ONLY Blood Culture results may not be optimal due to an inadequate volume of blood received in culture bottles   CULT  04/12/2022 1222    NO GROWTH 5 DAYS Performed at [facility] [address], [place], [place] [address]    REPTSTATUS 04/17/2022 FINAL 04/12/2022 1222  Radiology Studies: No results found.   LOS: 31 days   Antonieta Pert, MD [facility]  05/01/2022, 10:26 AM

## 2022-05-02 DIAGNOSIS — E875 Hyperkalemia: Secondary | ICD-10-CM | POA: Diagnosis not present

## 2022-05-02 LAB — CBC
HCT: 39.9 % (ref 39.0–52.0)
Hemoglobin: 12.9 g/dL — ABNORMAL LOW (ref 13.0–17.0)
MCH: 27.7 pg (ref 26.0–34.0)
MCHC: 32.3 g/dL (ref 30.0–36.0)
MCV: 85.8 fL (ref 80.0–100.0)
Platelets: 291 10*3/uL (ref 150–400)
RBC: 4.65 MIL/uL (ref 4.22–5.81)
RDW: 17.7 % — ABNORMAL HIGH (ref 11.5–15.5)
WBC: 5.4 10*3/uL (ref 4.0–10.5)
nRBC: 0 % (ref 0.0–0.2)

## 2022-05-02 LAB — BASIC METABOLIC PANEL
Anion gap: 8 (ref 5–15)
BUN: 38 mg/dL — ABNORMAL HIGH (ref 8–23)
CO2: 27 mmol/L (ref 22–32)
Calcium: 9.7 mg/dL (ref 8.9–10.3)
Chloride: 99 mmol/L (ref 98–111)
Creatinine, Ser: 1.25 mg/dL — ABNORMAL HIGH (ref 0.61–1.24)
GFR, Estimated: >60 mL/min (ref >60)
Glucose, Bld: 238 mg/dL — ABNORMAL HIGH (ref 70–99)
Potassium: 4.8 mmol/L (ref 3.5–5.1)
Sodium: 134 mmol/L — ABNORMAL LOW (ref 135–145)

## 2022-05-02 LAB — GLUCOSE, CAPILLARY
Glucose-Capillary: 180 mg/dL — ABNORMAL HIGH (ref 70–99)
Glucose-Capillary: 281 mg/dL — ABNORMAL HIGH (ref 70–99)
Glucose-Capillary: 201 mg/dL — ABNORMAL HIGH (ref 70–99)
Glucose-Capillary: 163 mg/dL — ABNORMAL HIGH (ref 70–99)

## 2022-05-02 NOTE — Progress Notes (Signed)
PROGRESS NOTE Christian [name]  ZOX:[id number] DOB: 1950/12/22 DOA: 03/30/2022 PCP: [name], MD   Brief Narrative/Hospital Course: 71 y.o.  male with a history of A-fib on Xarelto, HFpEF, PAD, DM-2, bipolar disorder-presented with generalized weakness/sacral decubitus ulcer-was found to have hypoglycemia, hyperkalemia and subsequently admitted to the hospitalist service. Patient was thought to have extrapyramidal symptoms-evaluated by psych/neurology-psych medications adjusted with clinical improvement.  Unfortunately-further hospital course complicated by development of fever-due to probable Streptococcus bacteremia and infected decubitus ulcerPatient is seen by ID and general surgery.Patient completed antibiotics.  Continues to remain deconditioned weak and frail.  PAD PAD completed 5/31-LDL denied SNF approved. 5/2>> admit to [facility] for weakness/hypoglycemia/hyperkalemia/rigidity-thought to have EPS 5/14>> febrile/tachycardic-Per nursing staff-discharge from prior existing sacral decubitus ulcer. Started on broad-spectrum IV antibiotics.  Prelim blood culture positive for Streptococcus species. 5/19>> wound care starting hydrotherapy 5/31>> discontinued hydrotherapy, wound care consulted. But PT resumed Hydrotherapy again 04/29/22.  Subjective: Patient was seen and examined this morning, he drank all juice, water no new complaint Overnight afebrile, BP stable blood sugar is stable   Assessment and Plan: Principal Problem:   Hyperkalemia Active Problems:   Bacteremia   Diabetes mellitus type 2 in nonobese (HCC)   Sacral decubitus ulcer, stage II (HCC)   Severe sepsis without septic shock (HCC)   Hypoglycemia  Rigidity/tremor/mildly elevated CK felt to be extrapyramidal syndrome rather than NMS: seen by neurology psychiatry, and this has resolved.tolerating Abilify and Cogentin, melatonin as needed.  Off Seroquel.Continue current supportive measures PT OT for deconditioning  Sepsis due to  possible strep bacteria-sacral decubitus ulcer with underlying sacral abscess: Sepsis resolved.  complete Zyvox 5/31 and Augmentin 5/26. patient was getting hydrotherapy from 5/19 discontinued 5/31 but back on and will cont  x 2 wk per PT/Wound care.   Acute metabolic encephalopathy due to sepsis and multifactorial.  Mentating fairly okay, conversant.   AKI on CKD stage IIIa in the setting of hemodynamic mediated creatinine improved to baseline.  Creatinine slightly uptrending I will hold off on torsemide for 1 or 2 doses then resume at lower dose. Recent Labs  Lab 04/26/22 0112 04/27/22 0054 04/28/22 0211 04/30/22 0310 05/02/22 0200  BUN 10 18 22  27* 38*  CREATININE 0.94 1.00 1.20 1.02 1.25*    Mild rhabdomyolysis nontraumatic, resolved. Hypokalemia and hypomagnesemia stable. Hyponatremia likely SIADH resolved.   Hypothyroidism continue cytomel. PAF rate controlled, continue with Xarelto, and metoprolol.  Monitoring daily. Hypertension BP well controlled on metoprolol PAD follow-up with vascular as outpatient  Chronic CHF with preserved EF 45 to 50% on recent echo. Resumed demedex 04/29/22-we will hold off due to rising creatinine.  Upper extremity edema resolved. Net IO Since Admission: -19,859.49 mL [05/02/22 1411] Filed Weights   04/02/22 1125 04/17/22 1243  Weight: 109.6 kg 109.8 kg      Bipolar disorder concern for EPS on Abilify and Cogentin ; Depakote and Wellbutrin and Seroquel OSA on CPAP nightly Left arm swelling likely due to IV infiltration could have superficial venous thrombosis left upper extremity.  Negative for DVT continue supportive care elevate the arm continue anticoagulation  Type 2 diabetes mellitus A1c 7.6 on 4/15, with uncontrolled hyperglycemia -now blood sugar improving after increasing Semglee 10> 12 units 6/3-, cont ssi and adjust. Recent Labs  Lab 05/01/22 1244 05/01/22 1710 05/01/22 2140 05/02/22 0803 05/02/22 1243  GLUCAP 250* 174* 286* 180*  281*   Hypoglycemia resolved with supportive care.  DO NOT Resume Amaryl on discharge  Debility/deconditioning in the setting of chronic  illness.  Per family he has been nonambulatory since his discharge from hospital hospital in April 2023.  Patient will need a skilled nursing facility after completion of wound care.  Class I Obesity:Patient's Body mass index is 32.83 kg/m. : Will benefit with PCP follow-up, weight loss  healthy lifestyle.   DVT prophylaxis: Xarelto Code Status:   Code Status: Full Code Family Communication: plan of care discussed with patient at bedside. Patient status is: Home because of ongoing management of wound, deconditioning Level of care: Progressive   Dispo: The patient is from: home            Anticipated disposition: SNF after completion of hydrotherapy  Mobility Assessment (last 72 hours)     Mobility Assessment     Row Name 05/01/22 2000 05/01/22 0800 04/30/22 2004 04/30/22 1300 04/30/22 0900   Does patient have an order for bedrest or is patient medically unstable No - Continue assessment No - Continue assessment No - Continue assessment -- --   What is the highest level of mobility based on the progressive mobility assessment? Level 2 (Chairfast) - Balance while sitting on edge of bed and cannot stand Level 2 (Chairfast) - Balance while sitting on edge of bed and cannot stand Level 2 (Chairfast) - Balance while sitting on edge of bed and cannot stand Level 2 (Chairfast) - Balance while sitting on edge of bed and cannot stand Level 1 (Bedfast) - Unable to balance while sitting on edge of bed   Is the above level different from baseline mobility prior to current illness? Yes - Recommend PT order Yes - Recommend PT order Yes - Recommend PT order -- --    [place] Name 04/30/22 0800 04/29/22 1915         Does patient have an order for bedrest or is patient medically unstable No - Continue assessment No - Continue assessment      What is the highest level of  mobility based on the progressive mobility assessment? Level 2 (Chairfast) - Balance while sitting on edge of bed and cannot stand Level 2 (Chairfast) - Balance while sitting on edge of bed and cannot stand      Is the above level different from baseline mobility prior to current illness? Yes - Recommend PT order Yes - Recommend PT order                Objective: Vitals last 24 hrs: Vitals:   05/02/22 0329 05/02/22 0400 05/02/22 0800 05/02/22 1241  BP: 95/77 91/67 98/75  96/62  Pulse: 96 88 92 (!) 103  Resp: 16 19 14 20   Temp: 98 F (36.7 C)  97.8 F (36.6 C) 98.1 F (36.7 C)  TempSrc: Axillary  Oral Oral  SpO2: 93% 95% 94% 98%  Weight:      Height:       Weight change:   Physical Examination: General exam: AA,older than stated age, weak appearing. HEENT:Oral mucosa moist, Ear/Nose WNL grossly, dentition normal. Respiratory system: bilaterally diminished,no use of accessory muscle Cardiovascular system: S1 & S2 +, No JVD,. Gastrointestinal system: Abdomen soft,NT,ND, BS+ Nervous System:Alert, awake, moving extremities and grossly nonfocal Extremities: edema neg,distal peripheral pulses palpable.  Skin: No rashes,no icterus. MSK: Normal muscle bulk,tone, power Sacral decubitus ulcer   Medications reviewed:  Scheduled Meds:  ARIPiprazole  10 mg Oral q morning   atorvastatin  10 mg Oral Daily   benztropine  1 mg Oral BID   Chlorhexidine Gluconate Cloth  6 each Topical Daily  diclofenac Sodium  2 g Topical QID   feeding supplement  237 mL Oral TID BM   insulin aspart  0-9 Units Subcutaneous TID WC   insulin glargine-yfgn  12 Units Subcutaneous Daily   liothyronine  10 mcg Oral Daily   mouth rinse  15 mL Mouth Rinse BID   metoprolol succinate  50 mg Oral Daily   multivitamin with minerals  1 tablet Oral Daily   nutrition supplement (JUVEN)  1 packet Oral BID BM   rivaroxaban  20 mg Oral Q supper   senna-docusate  2 tablet Oral BID   Continuous  Infusions:  Pressure Injury 03/31/22 Sacrum Mid Unstageable - Full thickness tissue loss in which the base of the injury is covered by slough (yellow, tan, gray, green or brown) and/or eschar (tan, brown or black) in the wound bed. (Active)  03/31/22 1933  Location: Sacrum  Location Orientation: Mid  Staging: Unstageable - Full thickness tissue loss in which the base of the injury is covered by slough (yellow, tan, gray, green or brown) and/or eschar (tan, brown or black) in the wound bed.  Wound Description (Comments):   Present on Admission: Yes  Dressing Type Silver dressings;ABD;Gauze (Comment);Tape dressing 05/01/22 2045   Diet Order             Diet regular Room service appropriate? No; Fluid consistency: Thin  Diet effective now                    Nutrition Problem: Increased nutrient needs Etiology: chronic illness, wound healing Signs/Symptoms: estimated needs Interventions: Ensure Enlive (each supplement provides 350kcal and 20 grams of protein), Magic cup, Juven, MVI   Intake/Output Summary (Last 24 hours) at 05/02/2022 1411 Last data filed at 05/01/2022 2200 Gross per 24 hour  Intake --  Output 1425 ml  Net -1425 ml   Net IO Since Admission: -19,859.49 mL [05/02/22 1411]  Wt Readings from Last 3 Encounters:  04/17/22 109.8 kg  03/19/22 112 kg  03/11/22 117.9 kg     Unresulted Labs (From admission, onward)     Start     Ordered   05/03/22 [id number]  Basic metabolic panel  Tomorrow morning,   R       Question:  Specimen collection method  Answer:  Lab=Lab collect   05/02/22 0812   04/27/22 0616  Sodium, urine, random  Once,   R        04/27/22 0615   04/27/22 0616  Osmolality, urine  Once,   R        04/27/22 0615   04/27/22 0616  Creatinine, urine, random  Once,   R        04/27/22 0615          Data Reviewed: I have personally reviewed following labs and imaging studies CBC: Recent Labs  Lab 04/26/22 0112 04/30/22 0310 05/02/22 0200  WBC 4.6 5.1  5.4  NEUTROABS 2.7  --   --   HGB 12.5* 12.6* 12.9*  HCT 39.9 39.2 39.9  MCV 85.1 85.4 85.8  PLT 320 310 [id number]   Basic Metabolic Panel: Recent Labs  Lab 04/26/22 0112 04/27/22 0054 04/28/22 0211 04/30/22 0310 05/02/22 0200  NA 131* 131* 132* 133* 134*  K 4.7 4.9 4.9 4.3 4.8  CL 102 100 98 98 99  CO2 26 26 29 25 27   GLUCOSE 100* 147* 133* 134* 238*  BUN 10 18 22  27* 38*  CREATININE 0.94 1.00 1.20 1.02 1.25*  CALCIUM 9.2 9.0 9.3 9.4 9.7  MG 1.6* 2.0  --   --   --    GFR: Estimated Creatinine Clearance: 72.4 mL/min (A) (by C-G formula based on SCr of 1.25 mg/dL (H)). Liver Function Tests: Recent Labs  Lab 04/26/22 0112  AST 27  ALT 34  ALKPHOS 70  BILITOT 0.2*  PROT 6.7  ALBUMIN 2.0*   No results for input(s): LIPASE, AMYLASE in the last 168 hours. No results for input(s): AMMONIA in the last 168 hours. Coagulation Profile: No results for input(s): INR, PROTIME in the last 168 hours. BNP (last 3 results) No results for input(s): PROBNP in the last 8760 hours. HbA1C: No results for input(s): HGBA1C in the last 72 hours. CBG: Recent Labs  Lab 05/01/22 1244 05/01/22 1710 05/01/22 2140 05/02/22 0803 05/02/22 1243  GLUCAP 250* 174* 286* 180* 281*   Lipid Profile: No results for input(s): CHOL, HDL, LDLCALC, TRIG, CHOLHDL, LDLDIRECT in the last 72 hours. Thyroid Function Tests: No results for input(s): TSH, T4TOTAL, FREET4, T3FREE, THYROIDAB in the last 72 hours. Sepsis Labs: No results for input(s): PROCALCITON, LATICACIDVEN in the last 168 hours.   No results found for this or any previous visit (from the past 240 hour(s)).  Antimicrobials: Anti-infectives (From admission, onward)    Start     Dose/Rate Route Frequency Ordered Stop   04/22/22 1015  linezolid (ZYVOX) tablet 600 mg        600 mg Oral Every 12 hours 04/22/22 0916 04/26/22 2110   04/15/22 0900  vancomycin (VANCOREADY) IVPB 1500 mg/300 mL  Status:  Discontinued        1,500 mg 150 mL/hr over  120 Minutes Intravenous Every 24 hours 04/14/22 0811 04/14/22 1005   04/14/22 2200  amoxicillin-clavulanate (AUGMENTIN) 875-125 MG per tablet 1 tablet        1 tablet Oral Every 12 hours 04/14/22 1331 04/23/22 2148   04/14/22 1100  DAPTOmycin (CUBICIN) 700 mg in sodium chloride 0.9 % IVPB  Status:  Discontinued        8 mg/kg  90.4 kg (Adjusted) 128 mL/hr over 30 Minutes Intravenous Daily 04/14/22 1005 04/22/22 0916   04/14/22 0900  vancomycin (VANCOREADY) IVPB 2000 mg/400 mL  Status:  Discontinued        2,000 mg 200 mL/hr over 120 Minutes Intravenous  Once 04/14/22 0811 04/14/22 1005   04/13/22 2200  metroNIDAZOLE (FLAGYL) tablet 500 mg  Status:  Discontinued        500 mg Oral Every 12 hours 04/13/22 1436 04/14/22 1331   04/12/22 0815  cefTRIAXone (ROCEPHIN) 2 g in sodium chloride 0.9 % 100 mL IVPB  Status:  Discontinued        2 g 200 mL/hr over 30 Minutes Intravenous Daily 04/12/22 0717 04/14/22 0811   04/12/22 0600  vancomycin (VANCOREADY) IVPB 1250 mg/250 mL  Status:  Discontinued        1,250 mg 166.7 mL/hr over 90 Minutes Intravenous Every 24 hours 04/11/22 0521 04/12/22 0717   04/11/22 1145  metroNIDAZOLE (FLAGYL) IVPB 500 mg  Status:  Discontinued        500 mg 100 mL/hr over 60 Minutes Intravenous Every 12 hours 04/11/22 1055 04/13/22 1436   04/11/22 0600  ceFEPIme (MAXIPIME) 2 g in sodium chloride 0.9 % 100 mL IVPB  Status:  Discontinued        2 g 200 mL/hr over 30 Minutes Intravenous Every 8 hours 04/11/22 0437 04/12/22 0717   04/11/22 0530  vancomycin (  VANCOREADY) IVPB 2000 mg/400 mL        2,000 mg 200 mL/hr over 120 Minutes Intravenous  Once 04/11/22 0437 04/11/22 2008      Culture/Microbiology    Component Value Date/Time   SDES BLOOD LEFT HAND 04/12/2022 1222   SPECREQUEST  04/12/2022 1222    BOTTLES DRAWN AEROBIC ONLY Blood Culture results may not be optimal due to an inadequate volume of blood received in culture bottles   CULT  04/12/2022 1222    NO GROWTH  5 DAYS Performed at [facility], [place] [address] [place], [place] [address]    REPTSTATUS 04/17/2022 FINAL 04/12/2022 1222  Radiology Studies: No results found.   LOS: 32 days   Antonieta Pert, MD [facility]  05/02/2022, 2:11 PM

## 2022-05-03 ENCOUNTER — Inpatient Hospital Stay (HOSPITAL_COMMUNITY): Payer: Medicare HMO

## 2022-05-03 DIAGNOSIS — E875 Hyperkalemia: Secondary | ICD-10-CM | POA: Diagnosis not present

## 2022-05-03 LAB — BASIC METABOLIC PANEL
Anion gap: 8 (ref 5–15)
BUN: 42 mg/dL — ABNORMAL HIGH (ref 8–23)
CO2: 25 mmol/L (ref 22–32)
Calcium: 9.3 mg/dL (ref 8.9–10.3)
Chloride: 98 mmol/L (ref 98–111)
Creatinine, Ser: 1.04 mg/dL (ref 0.61–1.24)
GFR, Estimated: >60 mL/min (ref >60)
Glucose, Bld: 151 mg/dL — ABNORMAL HIGH (ref 70–99)
Potassium: 4.9 mmol/L (ref 3.5–5.1)
Sodium: 131 mmol/L — ABNORMAL LOW (ref 135–145)

## 2022-05-03 LAB — GLUCOSE, CAPILLARY
Glucose-Capillary: 131 mg/dL — ABNORMAL HIGH (ref 70–99)
Glucose-Capillary: 166 mg/dL — ABNORMAL HIGH (ref 70–99)
Glucose-Capillary: 285 mg/dL — ABNORMAL HIGH (ref 70–99)
Glucose-Capillary: 218 mg/dL — ABNORMAL HIGH (ref 70–99)
Glucose-Capillary: 254 mg/dL — ABNORMAL HIGH (ref 70–99)

## 2022-05-03 LAB — PHOSPHORUS: Phosphorus: 2.5 mg/dL (ref 2.5–4.6)

## 2022-05-03 LAB — MAGNESIUM: Magnesium: 2 mg/dL (ref 1.7–2.4)

## 2022-05-03 MED ORDER — JEVITY 1.5 CAL/FIBER PO LIQD
1000.0000 mL | ORAL | Status: DC
Start: 2022-05-03 — End: 2022-05-11
  Administered 2022-05-03 – 2022-05-11 (×5): 1000 mL
  Filled 2022-05-03 (×13): qty 1000

## 2022-05-03 MED ORDER — TAMSULOSIN HCL 0.4 MG PO CAPS
0.4000 mg | ORAL_CAPSULE | Freq: Every day | ORAL | Status: DC
  Administered 2022-05-03 – 2022-05-20 (×17): 0.4 mg via ORAL
  Filled 2022-05-03 (×17): qty 1

## 2022-05-03 MED ORDER — INSULIN ASPART 100 UNIT/ML IJ SOLN
2.0000 [IU] | Freq: Once | INTRAMUSCULAR | Status: AC
Start: 2022-05-03 — End: 2022-05-03
  Administered 2022-05-03: 2 [IU] via SUBCUTANEOUS

## 2022-05-03 NOTE — Progress Notes (Signed)
Pt refusing I/O cath, RN educated pt on the importance of I/O cath. Provider Marga Melnick MD notified. No new orders.

## 2022-05-03 NOTE — Progress Notes (Signed)
Physical Therapy Wound Treatment Patient Details  Name: Christian [name] MRN: [id number] Date of Birth: [birth year]  Today's Date: 05/03/2022 Time: 1123-1206 Time Calculation (min): 43 min  Subjective  Subjective Assessment Subjective: Pt stating "ouch" repeatedly during session. Patient and Family Stated Goals: agreeable to session Date of Onset:  (Unknown) Prior Treatments: Dressing changes  Pain Score:  Pre-medicated, but still not tolerating pain well, stating "ouch" repeatedly during session  Wound Assessment  Pressure Injury 03/31/22 Sacrum Mid Unstageable - Full thickness tissue loss in which the base of the injury is covered by slough (yellow, tan, gray, green or brown) and/or eschar (tan, brown or black) in the wound bed. (Active)  Wound Image  04/28/22 1733  Dressing Type Foam - Lift dressing to assess site every shift;Silver hydrofiber;Barrier Film (skin prep);Gauze (Comment) 05/03/22 1215  Dressing Clean, Dry, Intact;Changed 05/03/22 1215  Dressing Change Frequency Twice a day 05/03/22 1215  State of Healing Early/partial granulation 05/03/22 1215  Site / Wound Assessment Painful;Yellow;Pink;Red 05/03/22 1215  % Wound base Red or Granulating 90% 05/03/22 1215  % Wound base Yellow/Fibrinous Exudate 10% 05/03/22 1215  % Wound base Black/Eschar 0% 05/03/22 1215  % Wound base Other/Granulation Tissue (Comment) 0% 05/03/22 1215  Peri-wound Assessment Erythema (non-blanchable);Pink;Maceration 05/03/22 1215  Wound Length (cm) 9 cm 04/28/22 1733  Wound Width (cm) 9 cm 04/28/22 1733  Wound Depth (cm) 2.5 cm 04/28/22 1733  Wound Surface Area (cm^2) 81 cm^2 04/28/22 1733  Wound Volume (cm^3) 202.5 cm^3 04/28/22 1733  Tunneling (cm) 7.2 at ~7:00 04/28/22 1733  Undermining (cm) 2 from 7:00-9:00, 1.5 from 11:00-12:00, 2.25 cm at 12:00, 0.75 cm 12:00-1:00 04/28/22 1733  Margins Unattached edges (unapproximated) 05/03/22 1215  Drainage Amount Moderate 05/03/22 1215  Drainage  Description Serosanguineous;Purulent 05/03/22 1215  Treatment Debridement (Selective);Irrigation;Other (Comment);Packing (Dry gauze) 05/03/22 1215   Hydrotherapy Pulsed lavage therapy - wound location: Sacrum Pulsed Lavage with Suction (psi): 4 psi (started with 8-12 psi but pt reported pain, thus reduced to 4) Pulsed Lavage with Suction - Normal Saline Used: 1000 mL Pulsed Lavage Tip: Tip with splash shield Selective Debridement Selective Debridement - Location: Sacrum Selective Debridement - Tools Used: Forceps, Scissors Selective Debridement - Tissue Removed: Yellow nectrotic tissue    Wound Assessment and Plan  Wound Therapy - Assess/Plan/Recommendations Wound Therapy - Clinical Statement: Pt pre-medicated, but still stating "ouch" repeatedly during session. Pt's wound continues to demonstrate gradual improvement with increased red granulation tissue. The wound continues to have purpulent drainage in the tunnel, thus will continue the plan to see the pt for hydrotherapy later this week and potentially reduce frequency to 1x/week the following week to ensure wound continues to make good progress with the Aquacel Ag. Wound Therapy - Functional Problem List: Global weakness; apparently had not been out of bed since he got back to ALF from SNF ~1 week (PTA). Factors Delaying/Impairing Wound Healing: Diabetes Mellitus, Incontinence, Infection - systemic/local, Immobility, Multiple medical problems Hydrotherapy Plan: Debridement, Dressing change, Patient/family education, Pulsatile lavage with suction Wound Therapy - Frequency: Other (comment) (2x/week M & F this week) Wound Therapy - Follow Up Recommendations: dressing changes by RN  Wound Therapy Goals- Improve the function of patient's integumentary system by progressing the wound(s) through the phases of wound healing (inflammation - proliferation - remodeling) by: Wound Therapy Goals - Improve the function of patient's integumentary  system by progressing the wound(s) through the phases of wound healing by: Decrease Necrotic Tissue to: 5 Decrease Necrotic Tissue - Progress: Updated due to  goal met Increase Granulation Tissue to: 95 Increase Granulation Tissue - Progress: Updated due to goal met Improve Drainage Characteristics: Min, Serous Improve Drainage Characteristics - Progress: Progressing toward goal Goals/treatment plan/discharge plan were made with and agreed upon by patient/family: Yes Time For Goal Achievement: 7 days Wound Therapy - Potential for Goals: Fair  Goals will be updated until maximal potential achieved or discharge criteria met.  Discharge criteria: when goals achieved, discharge from hospital, MD decision/surgical intervention, no progress towards goals, refusal/missing three consecutive treatments without notification or medical reason.  GP     Charges PT Wound Care Charges $Wound Debridement up to 20 cm: < or equal to 20 cm $PT Hydrotherapy Dressing: 2 dressings $PT Hydrotherapy Visit: 1 Visit       Moishe Spice, PT, DPT Acute Rehabilitation Services  Pager: [phone] Office: [place] 05/03/2022, 12:24 PM

## 2022-05-03 NOTE — Progress Notes (Signed)
PROGRESS NOTE Christian [name]  SAY:[id number] DOB: [birth year] DOA: 03/30/2022 PCP: [name], MD   Brief Narrative/Hospital Course: 71 y.o.  male with a history of A-fib on Xarelto, HFpEF, PAD, DM-2, bipolar disorder-presented with generalized weakness/sacral decubitus ulcer-was found to have hypoglycemia, hyperkalemia and subsequently admitted to the hospitalist service. Patient was thought to have extrapyramidal symptoms-evaluated by psych/neurology-psych medications adjusted with clinical improvement.  Unfortunately-further hospital course complicated by development of fever-due to probable Streptococcus bacteremia and infected decubitus ulcerPatient is seen by ID and general surgery.Patient completed antibiotics.  Continues to remain deconditioned weak and frail.  PAD PAD completed 5/31-LDL denied SNF approved. 5/2>> admit to [facility] for weakness/hypoglycemia/hyperkalemia/rigidity-thought to have EPS 5/14>> febrile/tachycardic-Per nursing staff-discharge from prior existing sacral decubitus ulcer. Started on broad-spectrum IV antibiotics.  Prelim blood culture positive for Streptococcus species. 5/19>> wound care starting hydrotherapy 5/31>> discontinued hydrotherapy, wound care consulted. But PT resumed Hydrotherapy again 04/29/22.  Subjective: Seen and examined this morning.  Resting comfortably. Overnight afebrile saturating well on room air Labs with improved creatinine 1.0 Not eating well  Assessment and Plan: Principal Problem:   Hyperkalemia Active Problems:   Bacteremia   Diabetes mellitus type 2 in nonobese (HCC)   Sacral decubitus ulcer, stage II (HCC)   Severe sepsis without septic shock (HCC)   Hypoglycemia  Rigidity/tremor/mildly elevated CK felt to be extrapyramidal syndrome rather than NMS: seen by neurology psychiatry, and this has resolved.tolerating Abilify and Cogentin, melatonin as needed.  Off Seroquel.Continue current supportive measures PT OT for  deconditioning  Sepsis due to possible strep bacteria-sacral decubitus ulcer with underlying sacral abscess: Sepsis resolved.  complete Zyvox 5/31 and Augmentin 5/26. patient was getting hydrotherapy from 5/19 discontinued 5/31 but back on hydrotherapy continue as per PT OT wound care.  Patient has a Foley catheter in place due to patient's sacral decubitus and retention.  Acute metabolic encephalopathy due to sepsis and multifactorial.  Mentating fairly okay, conversant.   AKI on CKD stage IIIa in the setting of hemodynamic mediated creatinine improved to baseline.  Creatinine was uptrending and improved after holding torsemide- resume torsemide at ?5 mg in 2 days  Recent Labs  Lab 04/27/22 0054 04/28/22 0211 04/30/22 0310 05/02/22 0200 05/03/22 0115  BUN 18 22 27* 38* 42*  CREATININE 1.00 1.20 1.02 1.25* 1.04     Mild rhabdomyolysis nontraumatic, resolved. Hypokalemia and hypomagnesemia stable. Hyponatremia likely SIADH , at 131 today Hypothyroidism on cytomel. PAF rate controlled, continue with Xarelto, and metoprolol.  Monitoring daily. Hypertension BP controlled on metoprolol.  PAD follow-up with vascular as outpatient  Chronic CHF with preserved EF 45 to 50% on recent echo. Resumed demedex 04/29/22-but stopped 6/4 due to rising creatinine could consider resumption at lower dose if needed. Upper extremity edema resolved. Net IO Since Admission: -21,459.49 mL [05/03/22 1037] Filed Weights   04/02/22 1125 04/17/22 1243  Weight: 109.6 kg 109.8 kg      Bipolar disorder concern for EPS on Abilify and Cogentin ; Depakote and Wellbutrin and Seroquel OSA on CPAP nightly Left arm swelling likely due to IV infiltration could have superficial venous thrombosis left upper extremity.  Negative for DVT continue supportive care elevate the arm continue anticoagulation  Type 2 diabetes mellitus A1c 7.6 on 4/15, with uncontrolled hyperglycemia -now blood sugars well controlled after increasing  Semglee 10> 12 units 6/3-, cont ssi, monitor and adjust. Recent Labs  Lab 05/02/22 0803 05/02/22 1243 05/02/22 1651 05/02/22 2112 05/03/22 0742  GLUCAP 180* 281* 201* 163* 131*  Hypoglycemia resolved with supportive care.  DO NOT Resume Amaryl on discharge  Debility/deconditioning in the setting of chronic illness.  Per family he has been nonambulatory since his discharge from hospital hospital in April 2023.  Patient will need a skilled nursing facility after completion of wound care.  Foley catheter due to urine retention and sacral diabetes, could consider TOV, add flomax.  Class I Obesity:Patient's Body mass index is 32.83 kg/m. : Will benefit with PCP follow-up, weight loss  healthy lifestyle.  At risk of malnutrition with increased nutrient needs as he is  not having adequate oral intake it appears dietitian following recommending Cortrak tube and start tube  feeding if okay with wife. He is agreeable fot NGT feeding. Nutrition Problem: Increased nutrient needs Etiology: chronic illness, wound healing Signs/Symptoms: estimated needs Interventions: Ensure Enlive (each supplement provides 350kcal and 20 grams of protein), Magic cup, Juven, MVI    DVT prophylaxis: Xarelto Code Status:   Code Status: Full Code Family Communication: plan of care discussed with patient at bedside. Patient status is: Home because of ongoing management of wound, deconditioning Level of care: Progressive   Dispo: The patient is from: home            Anticipated disposition: SNF after completion of hydrotherapy  Mobility Assessment (last 72 hours)     Mobility Assessment     Row Name 05/01/22 2000 05/01/22 0800 04/30/22 2004 04/30/22 1300     Does patient have an order for bedrest or is patient medically unstable No - Continue assessment No - Continue assessment No - Continue assessment --    What is the highest level of mobility based on the progressive mobility assessment? Level 2  (Chairfast) - Balance while sitting on edge of bed and cannot stand Level 2 (Chairfast) - Balance while sitting on edge of bed and cannot stand Level 2 (Chairfast) - Balance while sitting on edge of bed and cannot stand Level 2 (Chairfast) - Balance while sitting on edge of bed and cannot stand    Is the above level different from baseline mobility prior to current illness? Yes - Recommend PT order Yes - Recommend PT order Yes - Recommend PT order --              Objective: Vitals last 24 hrs: Vitals:   05/02/22 2013 05/02/22 2300 05/03/22 0337 05/03/22 0743  BP: 102/75 101/65 109/67 112/77  Pulse: 86 87 77 89  Resp: 18 18 19 16   Temp: 98.7 F (37.1 C) 98.8 F (37.1 C) 98.4 F (36.9 C) 97.8 F (36.6 C)  TempSrc: Oral Oral Oral Oral  SpO2: 94% 95% 99% 93%  Weight:      Height:       Weight change:   Physical Examination: General exam: AA, conversant and pleasant, older than stated age, weak appearing. HEENT:Oral mucosa moist, Ear/Nose WNL grossly, dentition normal. Respiratory system: bilaterally diminished, no use of accessory muscle Cardiovascular system: S1 & S2 +, No JVD,. Gastrointestinal system: Abdomen soft,NT,ND,BS+ Nervous System:Alert, awake, moving extremities and grossly nonfocal Extremities: LE ankle edema none distal peripheral pulses palpable.  Skin: No rashes,no icterus.  Sacral decubitus, Foley in place MSK: Normal muscle bulk,tone, power    Medications reviewed:  Scheduled Meds:  ARIPiprazole  10 mg Oral q morning   atorvastatin  10 mg Oral Daily   benztropine  1 mg Oral BID   Chlorhexidine Gluconate Cloth  6 each Topical Daily   diclofenac Sodium  2 g Topical QID  feeding supplement  237 mL Oral TID BM   insulin aspart  0-9 Units Subcutaneous TID WC   insulin glargine-yfgn  12 Units Subcutaneous Daily   liothyronine  10 mcg Oral Daily   mouth rinse  15 mL Mouth Rinse BID   metoprolol succinate  50 mg Oral Daily   multivitamin with minerals  1  tablet Oral Daily   nutrition supplement (JUVEN)  1 packet Oral BID BM   rivaroxaban  20 mg Oral Q supper   senna-docusate  2 tablet Oral BID   Continuous Infusions:  Pressure Injury 03/31/22 Sacrum Mid Unstageable - Full thickness tissue loss in which the base of the injury is covered by slough (yellow, tan, gray, green or brown) and/or eschar (tan, brown or black) in the wound bed. (Active)  03/31/22 1933  Location: Sacrum  Location Orientation: Mid  Staging: Unstageable - Full thickness tissue loss in which the base of the injury is covered by slough (yellow, tan, gray, green or brown) and/or eschar (tan, brown or black) in the wound bed.  Wound Description (Comments):   Present on Admission: Yes  Dressing Type Silver dressings;ABD;Gauze (Comment);Tape dressing 05/01/22 2045   Diet Order             Diet regular Room service appropriate? No; Fluid consistency: Thin  Diet effective now                    Nutrition Problem: Increased nutrient needs Etiology: chronic illness, wound healing Signs/Symptoms: estimated needs Interventions: Ensure Enlive (each supplement provides 350kcal and 20 grams of protein), Magic cup, Juven, MVI   Intake/Output Summary (Last 24 hours) at 05/03/2022 1037 Last data filed at 05/03/2022 0744 Gross per 24 hour  Intake 100 ml  Output 1700 ml  Net -1600 ml    Net IO Since Admission: -21,459.49 mL [05/03/22 1037]  Wt Readings from Last 3 Encounters:  04/17/22 109.8 kg  03/19/22 112 kg  03/11/22 117.9 kg     Unresulted Labs (From admission, onward)     Start     Ordered   04/27/22 0616  Sodium, urine, random  Once,   R        04/27/22 0615   04/27/22 0616  Osmolality, urine  Once,   R        04/27/22 0615   04/27/22 0616  Creatinine, urine, random  Once,   R        04/27/22 0615          Data Reviewed: I have personally reviewed following labs and imaging studies CBC: Recent Labs  Lab 04/30/22 0310 05/02/22 0200  WBC 5.1 5.4   HGB 12.6* 12.9*  HCT 39.2 39.9  MCV 85.4 85.8  PLT 310 [id number]    Basic Metabolic Panel: Recent Labs  Lab 04/27/22 0054 04/28/22 0211 04/30/22 0310 05/02/22 0200 05/03/22 0115  NA 131* 132* 133* 134* 131*  K 4.9 4.9 4.3 4.8 4.9  CL 100 98 98 99 98  CO2 26 29 25 27 25   GLUCOSE 147* 133* 134* 238* 151*  BUN 18 22 27* 38* 42*  CREATININE 1.00 1.20 1.02 1.25* 1.04  CALCIUM 9.0 9.3 9.4 9.7 9.3  MG 2.0  --   --   --   --     GFR: Estimated Creatinine Clearance: 87 mL/min (by C-G formula based on SCr of 1.04 mg/dL). Liver Function Tests: No results for input(s): AST, ALT, ALKPHOS, BILITOT, PROT, ALBUMIN in the last 168  hours.  No results for input(s): LIPASE, AMYLASE in the last 168 hours. No results for input(s): AMMONIA in the last 168 hours. Coagulation Profile: No results for input(s): INR, PROTIME in the last 168 hours. BNP (last 3 results) No results for input(s): PROBNP in the last 8760 hours. HbA1C: No results for input(s): HGBA1C in the last 72 hours. CBG: Recent Labs  Lab 05/02/22 0803 05/02/22 1243 05/02/22 1651 05/02/22 2112 05/03/22 0742  GLUCAP 180* 281* 201* 163* 131*    Lipid Profile: No results for input(s): CHOL, HDL, LDLCALC, TRIG, CHOLHDL, LDLDIRECT in the last 72 hours. Thyroid Function Tests: No results for input(s): TSH, T4TOTAL, FREET4, T3FREE, THYROIDAB in the last 72 hours. Sepsis Labs: No results for input(s): PROCALCITON, LATICACIDVEN in the last 168 hours.   No results found for this or any previous visit (from the past 240 hour(s)).  Antimicrobials: Anti-infectives (From admission, onward)    Start     Dose/Rate Route Frequency Ordered Stop   04/22/22 1015  linezolid (ZYVOX) tablet 600 mg        600 mg Oral Every 12 hours 04/22/22 0916 04/26/22 2110   04/15/22 0900  vancomycin (VANCOREADY) IVPB 1500 mg/300 mL  Status:  Discontinued        1,500 mg 150 mL/hr over 120 Minutes Intravenous Every 24 hours 04/14/22 0811 04/14/22 1005    04/14/22 2200  amoxicillin-clavulanate (AUGMENTIN) 875-125 MG per tablet 1 tablet        1 tablet Oral Every 12 hours 04/14/22 1331 04/23/22 2148   04/14/22 1100  DAPTOmycin (CUBICIN) 700 mg in sodium chloride 0.9 % IVPB  Status:  Discontinued        8 mg/kg  90.4 kg (Adjusted) 128 mL/hr over 30 Minutes Intravenous Daily 04/14/22 1005 04/22/22 0916   04/14/22 0900  vancomycin (VANCOREADY) IVPB 2000 mg/400 mL  Status:  Discontinued        2,000 mg 200 mL/hr over 120 Minutes Intravenous  Once 04/14/22 0811 04/14/22 1005   04/13/22 2200  metroNIDAZOLE (FLAGYL) tablet 500 mg  Status:  Discontinued        500 mg Oral Every 12 hours 04/13/22 1436 04/14/22 1331   04/12/22 0815  cefTRIAXone (ROCEPHIN) 2 g in sodium chloride 0.9 % 100 mL IVPB  Status:  Discontinued        2 g 200 mL/hr over 30 Minutes Intravenous Daily 04/12/22 0717 04/14/22 0811   04/12/22 0600  vancomycin (VANCOREADY) IVPB 1250 mg/250 mL  Status:  Discontinued        1,250 mg 166.7 mL/hr over 90 Minutes Intravenous Every 24 hours 04/11/22 0521 04/12/22 0717   04/11/22 1145  metroNIDAZOLE (FLAGYL) IVPB 500 mg  Status:  Discontinued        500 mg 100 mL/hr over 60 Minutes Intravenous Every 12 hours 04/11/22 1055 04/13/22 1436   04/11/22 0600  ceFEPIme (MAXIPIME) 2 g in sodium chloride 0.9 % 100 mL IVPB  Status:  Discontinued        2 g 200 mL/hr over 30 Minutes Intravenous Every 8 hours 04/11/22 0437 04/12/22 0717   04/11/22 0530  vancomycin (VANCOREADY) IVPB 2000 mg/400 mL        2,000 mg 200 mL/hr over 120 Minutes Intravenous  Once 04/11/22 0437 04/11/22 2008      Culture/Microbiology    Component Value Date/Time   SDES BLOOD LEFT HAND 04/12/2022 1222   SPECREQUEST  04/12/2022 1222    BOTTLES DRAWN AEROBIC ONLY Blood Culture results may not  be optimal due to an inadequate volume of blood received in culture bottles   CULT  04/12/2022 1222    NO GROWTH 5 DAYS Performed at [facility] [address],  [place], [place] [address]    REPTSTATUS 04/17/2022 FINAL 04/12/2022 1222  Radiology Studies: No results found.   LOS: 59 days   Antonieta Pert, MD [facility]  05/03/2022, 10:37 AM

## 2022-05-03 NOTE — Progress Notes (Signed)
Nutrition Follow-up  DOCUMENTATION CODES:   Not applicable  INTERVENTION:   Continue Multivitamin w/ minerals daily Continue Ensure Enlive po TID, each supplement provides 350 kcal and 20 grams of protein. Discontinue Magic cup  Continue 1 packet Juven BID, each packet provides 95 calories, 2.5 grams of protein to support wound healing Meal ordering with assist Check micronutrient labs Vitamin A, C, Zinc due to ongoing wound Tube feeds via Cortrak:  Start Jevity 1.5 @ 30 mL/hr and advance by 10 mL/hr q6h until goal rate of 60 mL/hr is reached (1440 mL/hr) Provides 2160 kcal, 92 gm protein, and 1094 mL of free water daily. Monitor magnesium, potassium, and phosphorus BID for at least 3 days, MD to replete as needed, as pt is at risk for refeeding syndrome given poor PO intake >7 days.  NUTRITION DIAGNOSIS:   Increased nutrient needs related to chronic illness, wound healing as evidenced by estimated needs. - Ongoing  GOAL:   Patient will meet greater than or equal to 90% of their needs - Ongoing  MONITOR:   PO intake, Supplement acceptance, Labs, TF tolerance, Skin  REASON FOR ASSESSMENT:   Consult Enteral/tube feeding initiation and management  ASSESSMENT:   71 y.o. male presented to the ED with generalized weakness. Pt recently discharged from rehab after hospital admission due to hypoglycemia cellulitis. PMH includes CHF, CKD III, HTN, and T2DM. Pt admitted with hypoglycemia, hyperkalemia, and generalized weakness.   5/11 - diet downgraded to Dysphagia 1, Nectar Thick Liquids 5/15 - NPO 5/16 - diet advanced to Dysphagia 1, Nectar Thick Liquids 5/18 - diet advanced to thin liquids 5/19 - Hydrotherapy started 5/25 - diet advanced to Dysphagia 2 6/02 - diet advanced to regular 6/05 - Cortrak placed (tip gastric)  Pt resting in bed. Pt PO intake has been very minimal over the past few days.  Discussed with RN, states that pt drinks all Ensures and Juven, but only takes  a few bites of food. Discussed with team and pt about placing Cortrak feeding tube to provide supplemental nutrition until pt PO intake become adequate. Pt request RD to speak to daughter first.  This RD spoke with pt daughter over the phone. RD explained what a Cortrak tube was, the use of it, and that it does not impact the pt from being able to eat or drink. Daughter agreeable to the tube. Reviewed current diet order with pt daughter, daughter expressed that pt did not like the purees or chopped foods and that contributed to pt poor PO. Discussed making pt meal ordering with assist in hopes that pt can pick foods that he likes to eat and hopefully will increase PO intake.   Pt with no new weight in >2 weeks. Recommend obtaining new weight, reached out to RN to notify. Received and updated weight from RN. Pt down 9.2 kg (8%) since admission, this is clinically significant for time frame.   Medications reviewed and include: SSI 0-9 units TID, Semglee, MVI, Senokot Labs reviewed: Sodium 131, 24 hr CBG 115-193  Diet Order:   Diet Order             Diet regular Room service appropriate? No; Fluid consistency: Thin  Diet effective now                  EDUCATION NEEDS:   No education needs have been identified at this time  Skin:  Skin Assessment: Skin Integrity Issues: Skin Integrity Issues:: Unstageable Stage III: Sacrum Unstageable: Coccyx & Buttocks  Last BM:  6/4  Height:  Ht Readings from Last 1 Encounters:  04/17/22 6' (1.829 m)   Weight:  Wt Readings from Last 1 Encounters:  05/03/22 100.4 kg   Ideal Body Weight:  80.9 kg  BMI:  Body mass index is 30.03 kg/m.  Estimated Nutritional Needs:  Kcal:  2100-2300 Protein:  105-120 grams Fluid:  >/= 2.1 L   Hermina Barters RD, LDN Clinical Dietitian See [facility] for contact information.

## 2022-05-03 NOTE — Plan of Care (Signed)
  Problem: Education: Goal: Knowledge of General Education information will improve Description: Including pain rating scale, medication(s)/side effects and non-pharmacologic comfort measures Outcome: Progressing   Problem: Clinical Measurements: Goal: Ability to maintain clinical measurements within normal limits will improve Outcome: Progressing Goal: Will remain free from infection Outcome: Progressing Goal: Diagnostic test results will improve Outcome: Progressing Goal: Respiratory complications will improve Outcome: Progressing Goal: Cardiovascular complication will be avoided Outcome: Progressing   Problem: Activity: Goal: Risk for activity intolerance will decrease Outcome: Progressing   Problem: Nutrition: Goal: Adequate nutrition will be maintained Outcome: Progressing   Problem: Pain Managment: Goal: General experience of comfort will improve Outcome: Progressing   Problem: Safety: Goal: Ability to remain free from injury will improve Outcome: Progressing   Problem: Skin Integrity: Goal: Risk for impaired skin integrity will decrease Outcome: Progressing   

## 2022-05-03 NOTE — Procedures (Signed)
Cortrak  Person Inserting Tube:  Akiera Allbaugh C, RD Tube Type:  Cortrak - 43 inches Tube Size:  10 Tube Location:  Left nare Secured by: Bridle Technique Used to Measure Tube Placement:  Marking at nare/corner of mouth Cortrak Secured At:  72 cm   Cortrak Tube Team Note:  Consult received to place a Cortrak feeding tube.   X-ray is required, abdominal x-ray has been ordered by the Cortrak team. Please confirm tube placement before using the Cortrak tube.   If the tube becomes dislodged please keep the tube and contact the Cortrak team at [email or web address] ([email or web address]) for replacement.  If after hours and replacement cannot be delayed, place a NG tube and confirm placement with an abdominal x-ray.    Nahum Sherrer P., RD, LDN, CNSC See [facility] for contact information    

## 2022-05-04 DIAGNOSIS — E875 Hyperkalemia: Secondary | ICD-10-CM | POA: Diagnosis not present

## 2022-05-04 LAB — GLUCOSE, CAPILLARY
Glucose-Capillary: 229 mg/dL — ABNORMAL HIGH (ref 70–99)
Glucose-Capillary: 231 mg/dL — ABNORMAL HIGH (ref 70–99)
Glucose-Capillary: 240 mg/dL — ABNORMAL HIGH (ref 70–99)
Glucose-Capillary: 259 mg/dL — ABNORMAL HIGH (ref 70–99)
Glucose-Capillary: 261 mg/dL — ABNORMAL HIGH (ref 70–99)
Glucose-Capillary: 292 mg/dL — ABNORMAL HIGH (ref 70–99)
Glucose-Capillary: 310 mg/dL — ABNORMAL HIGH (ref 70–99)

## 2022-05-04 LAB — MAGNESIUM
Magnesium: 2.1 mg/dL (ref 1.7–2.4)
Magnesium: 2 mg/dL (ref 1.7–2.4)

## 2022-05-04 LAB — PHOSPHORUS
Phosphorus: 2.8 mg/dL (ref 2.5–4.6)
Phosphorus: 1.9 mg/dL — ABNORMAL LOW (ref 2.5–4.6)

## 2022-05-04 MED ORDER — INSULIN GLARGINE-YFGN 100 UNIT/ML ~~LOC~~ SOLN
5.0000 [IU] | Freq: Once | SUBCUTANEOUS | Status: AC
Start: 2022-05-04 — End: 2022-05-04
  Administered 2022-05-04: 5 [IU] via SUBCUTANEOUS
  Filled 2022-05-04: qty 0.05

## 2022-05-04 MED ORDER — INSULIN GLARGINE-YFGN 100 UNIT/ML ~~LOC~~ SOLN
17.0000 [IU] | Freq: Every day | SUBCUTANEOUS | Status: DC
  Administered 2022-05-05 – 2022-05-06 (×2): 17 [IU] via SUBCUTANEOUS
  Filled 2022-05-04 (×3): qty 0.17

## 2022-05-04 MED ORDER — INSULIN ASPART 100 UNIT/ML IJ SOLN
3.0000 [IU] | Freq: Once | INTRAMUSCULAR | Status: AC
Start: 2022-05-04 — End: 2022-05-04
  Administered 2022-05-04: 3 [IU] via SUBCUTANEOUS

## 2022-05-04 NOTE — Progress Notes (Signed)
Inpatient Diabetes Program Recommendations  AACE/ADA: New Consensus Statement on Inpatient Glycemic Control (2015)  Target Ranges:  Prepandial:   less than 140 mg/dL      Peak postprandial:   less than 180 mg/dL (1-2 hours)      Critically ill patients:  140 - 180 mg/dL   Lab Results  Component Value Date   GLUCAP 310 (H) 05/04/2022   HGBA1C 7.6 (H) 03/13/2022    Review of Glycemic Control  Latest Reference Range & Units 05/03/22 19:34 05/03/22 21:51 05/04/22 00:09 05/04/22 03:12 05/04/22 08:14 05/04/22 12:02  Glucose-Capillary 70 - 99 mg/dL 756 (H) 433 (H) 295 (H) 231 (H) 240 (H) 310 (H)   Diabetes history: DM 2 Outpatient Diabetes medications:  Jardiance 10 mg daily, Amaryl 2 mg daily, Lantus 5 units daily Current orders for Inpatient glycemic control:  Novolog 0-9 units tid with meals Jevity 1.5 at 60 ml/hr Semglee 17 units daily  Inpatient Diabetes Program Recommendations:    Please increase frequency of Novolog correction to q 4 hours while on tube feeds.   Thanks,  Beryl Meager, RN, BC-ADM Inpatient Diabetes Coordinator Pager [phone]  (8a-5p)

## 2022-05-04 NOTE — Progress Notes (Signed)
0300 CBG 231. Provider Marga Melnick MD notify. No new orders at this time. Per MD next CBG recheck in 4 hrs.

## 2022-05-04 NOTE — Progress Notes (Incomplete)
Palliative Medicine Progress Note   Patient Name: Christian [name]       Date: 05/04/2022 DOB: 09/19/1951  Age: 71 y.o. MRN#: [id number] Attending Physician: Lanae Boast, MD Primary Care Physician: [name], MD Admit Date: 03/30/2022  Reason for Consultation/Follow-up: {Reason for Consult:23484}  HPI/Patient Profile: 71 y.o. male  with past medical history of atrial fibrillation on Xarelto, HFpEF, PAD, DM type II, chronic kidney disease (baseline creatinine 1.6), and bipolar disorder.  He presented to the emergency department on 03/30/2022 with generalized weakness and sacral decubitus ulcer.  He was admitted to the hospitalist service with hypoglycemia and hyperkalemia.  There was initially concern of EPS and psych meds were adjusted with clinical improvement.  Hospital course has been complicated by development of strep bacteremia and infected decubitus ulcer.   5/14 - he became febrile and tachycardic with discharge from his sacral decubitus.  He was started on broad-spectrum IV antibiotics.  Blood cultures came back positive for Streptococcus species. 5/16 - CT abdomen/pelvis showed inferior sacral decubitus ulceration with no evidence of osteomyelitis.  Echo showed EF 45 to 50%, no obvious vegetations. 5/19 -started hydrotherapy  Prior to March, Jaevian was living independently in a senior living apartment.  He was hospitalized 01/26/2022 through 01/29/2022 with cellulitis secondary to diabetic ulcer of the ankle.  He was discharged to [facility] for rehab.  He came home on 3/17 and family reports he was back to his baseline and ambulatory at this point. He was hospitalized at [facility] long 03/11/2022 through 03/19/2022 with hypoglycemia and AKI.  He was discharged to [facility], but his insurance  only allowed for a certain amount of rehab days within 90 days and he had already used most of them.  Family brought him home, but were overwhelmed trying to take care of him and were having to call EMS every day to help lift him.  Subjective: Chart reviewed.  It appears patient has been denied for [facility].  Note that hydrotherapy was restarted on his 6/1.  I visited patient at bedside.  He is sleeping but awakens easily to voice.  He tells me he "needs to pee" but otherwise has no acute complaints at this time.  I mentioned the feeding tube and he is able to tell me that he was "not eating enough".  I asked  him if he is motivated and willing to do what he needs to do in order to get better, and he states that he is.    Objective:  Physical Exam Vitals reviewed.  Constitutional:      General: He is sleeping. He is not in acute distress.    Appearance: He is obese. He is ill-appearing.  Pulmonary:     Effort: Pulmonary effort is normal.  Neurological:     Mental Status: He is easily aroused.     Motor: Weakness present.            Vital Signs: BP 106/75 (BP Location: Left Arm)   Pulse 94   Temp 98.7 F (37.1 C) (Oral)   Resp 19   Ht 6' (1.829 m)   Wt 100.4 kg   SpO2 94%   BMI 30.03 kg/m  SpO2: SpO2: 94 % O2 Device: O2 Device: Room Air O2 Flow Rate: O2 Flow Rate (L/min): 2 L/min   LBM: Last BM Date : 05/02/22     Palliative Assessment/Data: ***     Palliative Medicine Assessment & Plan   Assessment: Principal Problem:   Hyperkalemia Active Problems:   Bacteremia   Diabetes mellitus type 2 in nonobese (HCC)   Sacral decubitus ulcer, stage II (HCC)   Severe sepsis without septic shock (HCC)   Hypoglycemia    Recommendations/Plan: ***  Goals of Care and Additional Recommendations: Limitations on Scope of Treatment: {Recommended Scope and Preferences:21019}  Code Status:   Prognosis:  {Palliative Care Prognosis:23504}  Discharge Planning: {Palliative  dispostion:23505}  Care plan was discussed with ***  Thank you for allowing the Palliative Medicine Team to assist in the care of this patient.   ***   Merry Proud, NP   Please contact Palliative Medicine Team phone at [phone] for questions and concerns.  For individual providers, please see [facility].

## 2022-05-04 NOTE — Progress Notes (Signed)
PROGRESS NOTE Christian [name]  VXB:[id number] DOB: [birth year] DOA: 03/30/2022 PCP: [name], MD   Brief Narrative/Hospital Course: 71 y.o.  male with a history of A-fib on Xarelto, HFpEF, PAD, DM-2, bipolar disorder-presented with generalized weakness/sacral decubitus ulcer-was found to have hypoglycemia, hyperkalemia and subsequently admitted to the hospitalist service. Patient was thought to have extrapyramidal symptoms-evaluated by psych/neurology-psych medications adjusted with clinical improvement.  Unfortunately-further hospital course complicated by development of fever-due to probable Streptococcus bacteremia and infected decubitus ulcerPatient is seen by ID and general surgery.Patient completed antibiotics.  Continues to remain deconditioned weak and frail.  PAD PAD completed 5/31-LDL denied SNF approved. 5/2>> admit to [facility] for weakness/hypoglycemia/hyperkalemia/rigidity-thought to have EPS 5/14>> febrile/tachycardic-Per nursing staff-discharge from prior existing sacral decubitus ulcer. Started on broad-spectrum IV antibiotics.  Prelim blood culture positive for Streptococcus species. 5/19>> wound care starting hydrotherapy 5/31>> discontinued hydrotherapy, wound care consulted. But PT resumed Hydrotherapy again 04/29/22.  Subjective: Seen and examined this morning, alert awake, pleasant, not in distress blood sugar running in mid 200s Foley was removed yesterday -refused and out cath last night at  urine retaining in 625 cc this morning  Assessment and Plan: Principal Problem:   Hyperkalemia Active Problems:   Bacteremia   Diabetes mellitus type 2 in nonobese (HCC)   Sacral decubitus ulcer, stage II (HCC)   Severe sepsis without septic shock (HCC)   Hypoglycemia  Rigidity/tremor/mildly elevated CK felt to be extrapyramidal syndrome rather than NMS: Resolved.  Seen by neurology psychiatry. ,  Continue on Abilify and Cogentin, melatonin as needed.  Off Seroquel.Continue current  supportive measures PT OT for deconditioning  Sepsis due to possible strep bacteria-sacral decubitus ulcer with underlying sacral abscess: Sepsis resolved.  complete Zyvox 5/31 and Augmentin 5/26. patient was getting hydrotherapy from 5/19 discontinued 5/31 but back on hydrotherapy continue as per PT OT wound care.  Patient has a Foley catheter in place due to patient's sacral decubitus and retention.  Acute metabolic encephalopathy due to sepsis and multifactorial.  Mental status stable.  AKI on CKD stage IIIa in the setting of hemodynamic mediated creatinine improved to baseline.  Creatinine was uptrending and improved after holding torsemide-can possibly resume torsemide at low-dose or lasix in a day or so. Recent Labs  Lab 04/28/22 0211 04/30/22 0310 05/02/22 0200 05/03/22 0115  BUN 22 27* 38* 42*  CREATININE 1.20 1.02 1.25* 1.04     Mild rhabdomyolysis nontraumatic, resolved. Hypokalemia and hypomagnesemia stable.  Monitor intermittently Hyponatremia likely SIADH , monitor Hypothyroidism on cytomel. PAF rate controlled, continue with Xarelto, and metoprolol.  Monitoring daily. Hypertension BP controlled on metoprolol.  PAD follow-up with vascular as outpatient  Chronic CHF with preserved EF 45 to 50% on recent echo. Resumed demedex 04/29/22-but stopped 6/4 due to rising creatinine could consider resumption at lower dose if needed. Upper extremity edema resolved. Net IO Since Admission: -21,002.49 mL [05/04/22 1037] Filed Weights   04/02/22 1125 04/17/22 1243 05/03/22 1658  Weight: 109.6 kg 109.8 kg 100.4 kg     Bipolar disorder concern for EPS .  Patient is now back on Abilify and Cogentin tolerating.   off Depakote and Wellbutrin and Seroquel OSA on CPAP nightly Left arm swelling likely due to IV infiltration could have superficial venous thrombosis left upper extremity.  Negative for DVT continue supportive care elevate the arm continue anticoagulation  Type 2 diabetes mellitus  with uncontrolled hyperglycemia A1c 7.6 on 4/15, since starting tube feeding blood sugar is running in the 250s- Increase Semglee 10> 12  units 6/3 and will do 17 units from 6/5-cont ssi, monitor and adjust.  Consult diabetes coordinator Recent Labs  Lab 05/03/22 1934 05/03/22 2151 05/04/22 0009 05/04/22 0312 05/04/22 0814  GLUCAP 218* 254* 229* 231* 240*    Hypoglycemia resolved. DO NOT Resume Amaryl on discharge  Debility/deconditioning in the setting of chronic illness.  Per family he has been nonambulatory since his discharge from hospital hospital in April 2023.  Patient will need a skilled nursing facility after completion of wound care.  Foley catheter due to urine retention and sacral diabetes, failed voiding trial 6/5, Flomax was added.  Refusing in and out catheter retaining 625 cc we will insert Foley catheter.   Class I Obesity:Patient's Body mass index is 30.03 kg/m. : Will benefit with PCP follow-up, weight loss  healthy lifestyle.  At risk of malnutrition with increased nutrient needs as he is  not having adequate oral intake it appears dietitian following recommended Cortrak tube and started tube  feeding  6/5.  Monitor blood glucose.  Nutrition Problem: Increased nutrient needs Etiology: chronic illness, wound healing Signs/Symptoms: estimated needs Interventions: Ensure Enlive (each supplement provides 350kcal and 20 grams of protein), Tube feeding, MVI    DVT prophylaxis: Xarelto Code Status:   Code Status: Full Code Family Communication: plan of care discussed with patient at bedside. Patient status is: Home because of ongoing management of wound, deconditioning Level of care: Progressive   Dispo: The patient is from: home            Anticipated disposition: SNF after completion of hydrotherapy  Mobility Assessment (last 72 hours)     Mobility Assessment     Row Name 05/04/22 0954 05/03/22 2000 05/01/22 2000       Does patient have an order for bedrest or  is patient medically unstable -- No - Continue assessment No - Continue assessment     What is the highest level of mobility based on the progressive mobility assessment? Level 2 (Chairfast) - Balance while sitting on edge of bed and cannot stand Level 2 (Chairfast) - Balance while sitting on edge of bed and cannot stand Level 2 (Chairfast) - Balance while sitting on edge of bed and cannot stand     Is the above level different from baseline mobility prior to current illness? -- Yes - Recommend PT order Yes - Recommend PT order               Objective: Vitals last 24 hrs: Vitals:   05/03/22 2341 05/04/22 0000 05/04/22 0313 05/04/22 0815  BP: 101/76 121/79 106/77 106/75  Pulse: 92 94 93 94  Resp: (!) 21 (!) 21 19 19   Temp:  98.5 F (36.9 C) 98.6 F (37 C) 98.7 F (37.1 C)  TempSrc:  Oral Oral Oral  SpO2: 94% 97% 96% 94%  Weight:      Height:       Weight change:   Physical Examination: General exam: AA,older than stated age, weak appearing. HEENT:Oral mucosa moist, Ear/Nose WNL grossly, dentition normal. Respiratory system: bilaterally clear,no use of accessory muscle Cardiovascular system: S1 & S2 +, No JVD,. Gastrointestinal system: Abdomen soft,NT,ND, BS+. Cortrak+ Nervous System:Alert, awake, moving extremities and grossly nonfocal Extremities: edema neg chronic appearing hyperpigmentation,distal peripheral pulses palpable.  Skin: No rashes,no icterus. MSK: Normal muscle bulk,tone, power    Medications reviewed:  Scheduled Meds:  ARIPiprazole  10 mg Oral q morning   atorvastatin  10 mg Oral Daily   benztropine  1 mg Oral  BID   Chlorhexidine Gluconate Cloth  6 each Topical Daily   diclofenac Sodium  2 g Topical QID   feeding supplement  237 mL Oral TID BM   insulin aspart  0-9 Units Subcutaneous TID WC   insulin glargine-yfgn  12 Units Subcutaneous Daily   liothyronine  10 mcg Oral Daily   mouth rinse  15 mL Mouth Rinse BID   metoprolol succinate  50 mg Oral  Daily   multivitamin with minerals  1 tablet Oral Daily   nutrition supplement (JUVEN)  1 packet Oral BID BM   rivaroxaban  20 mg Oral Q supper   senna-docusate  2 tablet Oral BID   tamsulosin  0.4 mg Oral Daily   Continuous Infusions:  feeding supplement (JEVITY 1.5 CAL/FIBER) 50 mL/hr at 05/04/22 0731    Pressure Injury 03/31/22 Sacrum Mid Unstageable - Full thickness tissue loss in which the base of the injury is covered by slough (yellow, tan, gray, green or brown) and/or eschar (tan, brown or black) in the wound bed. (Active)  03/31/22 1933  Location: Sacrum  Location Orientation: Mid  Staging: Unstageable - Full thickness tissue loss in which the base of the injury is covered by slough (yellow, tan, gray, green or brown) and/or eschar (tan, brown or black) in the wound bed.  Wound Description (Comments):   Present on Admission: Yes  Dressing Type Foam - Lift dressing to assess site every shift 05/03/22 2000   Diet Order             Diet regular Room service appropriate? No; Fluid consistency: Thin  Diet effective now                    Nutrition Problem: Increased nutrient needs Etiology: chronic illness, wound healing Signs/Symptoms: estimated needs Interventions: Ensure Enlive (each supplement provides 350kcal and 20 grams of protein), Tube feeding, MVI   Intake/Output Summary (Last 24 hours) at 05/04/2022 1037 Last data filed at 05/04/2022 1022 Gross per 24 hour  Intake 657 ml  Output 200 ml  Net 457 ml    Net IO Since Admission: -21,002.49 mL [05/04/22 1037]  Wt Readings from Last 3 Encounters:  05/03/22 100.4 kg  03/19/22 112 kg  03/11/22 117.9 kg     Unresulted Labs (From admission, onward)     Start     Ordered   05/03/22 1744  Magnesium  (ICU Tube Feeding: PEPuP )  5A & 5P,   R (with TIMED occurrences)     Question:  Specimen collection method  Answer:  Lab=Lab collect   05/03/22 1743   05/03/22 1744  Phosphorus  (ICU Tube Feeding: PEPuP )  5A &  5P,   R (with TIMED occurrences)     Question:  Specimen collection method  Answer:  Lab=Lab collect   05/03/22 1743   05/03/22 1744  Vitamin A  Once,   R       Question:  Specimen collection method  Answer:  Lab=Lab collect   05/03/22 1744   05/03/22 1744  Vitamin C  Once,   R       Question:  Specimen collection method  Answer:  Lab=Lab collect   05/03/22 1744   05/03/22 1744  Zinc  Once,   R       Question:  Specimen collection method  Answer:  Lab=Lab collect   05/03/22 1744          Data Reviewed: I have personally reviewed following labs and  imaging studies CBC: Recent Labs  Lab 04/30/22 0310 05/02/22 0200  WBC 5.1 5.4  HGB 12.6* 12.9*  HCT 39.2 39.9  MCV 85.4 85.8  PLT 310 291    Basic Metabolic Panel: Recent Labs  Lab 04/28/22 0211 04/30/22 0310 05/02/22 0200 05/03/22 0115 05/03/22 1900 05/04/22 0549  NA 132* 133* 134* 131*  --   --   K 4.9 4.3 4.8 4.9  --   --   CL 98 98 99 98  --   --   CO2 29 25 27 25   --   --   GLUCOSE 133* 134* 238* 151*  --   --   BUN 22 27* 38* 42*  --   --   CREATININE 1.20 1.02 1.25* 1.04  --   --   CALCIUM 9.3 9.4 9.7 9.3  --   --   MG  --   --   --   --  2.0 2.1  PHOS  --   --   --   --  2.5 2.8    GFR: Estimated Creatinine Clearance: 83.4 mL/min (by C-G formula based on SCr of 1.04 mg/dL). Liver Function Tests: No results for input(s): AST, ALT, ALKPHOS, BILITOT, PROT, ALBUMIN in the last 168 hours.  No results for input(s): LIPASE, AMYLASE in the last 168 hours. No results for input(s): AMMONIA in the last 168 hours. Coagulation Profile: No results for input(s): INR, PROTIME in the last 168 hours. BNP (last 3 results) No results for input(s): PROBNP in the last 8760 hours. HbA1C: No results for input(s): HGBA1C in the last 72 hours. CBG: Recent Labs  Lab 05/03/22 1934 05/03/22 2151 05/04/22 0009 05/04/22 0312 05/04/22 0814  GLUCAP 218* 254* 229* 231* 240*    Lipid Profile: No results for input(s): CHOL,  HDL, LDLCALC, TRIG, CHOLHDL, LDLDIRECT in the last 72 hours. Thyroid Function Tests: No results for input(s): TSH, T4TOTAL, FREET4, T3FREE, THYROIDAB in the last 72 hours. Sepsis Labs: No results for input(s): PROCALCITON, LATICACIDVEN in the last 168 hours.   No results found for this or any previous visit (from the past 240 hour(s)).  Antimicrobials: Anti-infectives (From admission, onward)    Start     Dose/Rate Route Frequency Ordered Stop   04/22/22 1015  linezolid (ZYVOX) tablet 600 mg        600 mg Oral Every 12 hours 04/22/22 0916 04/26/22 2110   04/15/22 0900  vancomycin (VANCOREADY) IVPB 1500 mg/300 mL  Status:  Discontinued        1,500 mg 150 mL/hr over 120 Minutes Intravenous Every 24 hours 04/14/22 0811 04/14/22 1005   04/14/22 2200  amoxicillin-clavulanate (AUGMENTIN) 875-125 MG per tablet 1 tablet        1 tablet Oral Every 12 hours 04/14/22 1331 04/23/22 2148   04/14/22 1100  DAPTOmycin (CUBICIN) 700 mg in sodium chloride 0.9 % IVPB  Status:  Discontinued        8 mg/kg  90.4 kg (Adjusted) 128 mL/hr over 30 Minutes Intravenous Daily 04/14/22 1005 04/22/22 0916   04/14/22 0900  vancomycin (VANCOREADY) IVPB 2000 mg/400 mL  Status:  Discontinued        2,000 mg 200 mL/hr over 120 Minutes Intravenous  Once 04/14/22 0811 04/14/22 1005   04/13/22 2200  metroNIDAZOLE (FLAGYL) tablet 500 mg  Status:  Discontinued        500 mg Oral Every 12 hours 04/13/22 1436 04/14/22 1331   04/12/22 0815  cefTRIAXone (ROCEPHIN) 2 g in sodium  chloride 0.9 % 100 mL IVPB  Status:  Discontinued        2 g 200 mL/hr over 30 Minutes Intravenous Daily 04/12/22 0717 04/14/22 0811   04/12/22 0600  vancomycin (VANCOREADY) IVPB 1250 mg/250 mL  Status:  Discontinued        1,250 mg 166.7 mL/hr over 90 Minutes Intravenous Every 24 hours 04/11/22 0521 04/12/22 0717   04/11/22 1145  metroNIDAZOLE (FLAGYL) IVPB 500 mg  Status:  Discontinued        500 mg 100 mL/hr over 60 Minutes Intravenous Every 12  hours 04/11/22 1055 04/13/22 1436   04/11/22 0600  ceFEPIme (MAXIPIME) 2 g in sodium chloride 0.9 % 100 mL IVPB  Status:  Discontinued        2 g 200 mL/hr over 30 Minutes Intravenous Every 8 hours 04/11/22 0437 04/12/22 0717   04/11/22 0530  vancomycin (VANCOREADY) IVPB 2000 mg/400 mL        2,000 mg 200 mL/hr over 120 Minutes Intravenous  Once 04/11/22 0437 04/11/22 2008      Culture/Microbiology    Component Value Date/Time   SDES BLOOD LEFT HAND 04/12/2022 1222   SPECREQUEST  04/12/2022 1222    BOTTLES DRAWN AEROBIC ONLY Blood Culture results may not be optimal due to an inadequate volume of blood received in culture bottles   CULT  04/12/2022 1222    NO GROWTH 5 DAYS Performed at [facility], [address], [place], [place] [address]    REPTSTATUS 04/17/2022 FINAL 04/12/2022 1222  Radiology Studies: DG Abd Portable 1V  Result Date: 05/03/2022 CLINICAL DATA:  Feeding tube placement EXAM: PORTABLE ABDOMEN - 1 VIEW COMPARISON:  04/13/2022 FINDINGS: Limited radiograph of the upper abdomen was obtained for the purposes of enteric tube localization. Enteric tube is seen coursing below the diaphragm with distal tip terminating within the expected location of the gastric body. IMPRESSION: Enteric tube tip terminating within the gastric body. Electronically Signed   By: Duanne Guess D.O.   On: 05/03/2022 16:30     LOS: 34 days   Lanae Boast, MD [facility]  05/04/2022, 10:37 AM

## 2022-05-04 NOTE — Progress Notes (Signed)
Occupational Therapy Treatment Patient Details Name: Christian [name] MRN: [id number] DOB: 1951-07-25 Today's Date: 05/04/2022   History of present illness 71 y.o. male presented 5/2 with worsening strength , new decubitus ulcers and poor intake.  Recent admission for cellulitis and hypoglycemia, had just returned to ALF from SNF a week ago but not OOB since then.  Has new hypoglyemia, elevation of K+, creatinine, CK.  Ulcer is not infected but has LE skin is also irritated and has skin breakdown. LEFt UE swollen and Doppler ordered 5/15 to r/o DVT.  PMHx:  chronic venous stasis and chronic right shin unhealing wound, chronic HFpEF, IDDM, CAD with stenting x2 in 2021, PVD, CKD stage IIIa, HTN, DVT on Xarelto, bipolar disorder, OSA on CPAP at bedtime   OT comments  Patient pleasant and agreeable to therapy. Patient required +2 assist to get to EOB and tolerated 10 minutes on EOB. Patient performed 2 stands from EOB into stedy with +2 assist to clear bottom from bed with use of bed pads. Patient returned to bed in side lying position. Acute OT to continue to follow.    Recommendations for follow up therapy are one component of a multi-disciplinary discharge planning process, led by the attending physician.  Recommendations may be updated based on patient status, additional functional criteria and insurance authorization.    Follow Up Recommendations  Skilled nursing-short term rehab (<3 hours/day)    Assistance Recommended at Discharge Frequent or constant Supervision/Assistance  Patient can return home with the following  Two people to help with walking and/or transfers;Two people to help with bathing/dressing/bathroom;Assistance with feeding;Assistance with cooking/housework;Direct supervision/assist for medications management;Direct supervision/assist for financial management;Assist for transportation;Help with stairs or ramp for entrance   Equipment Recommendations  Other (comment)     Recommendations for Other Services      Precautions / Restrictions Precautions Precautions: Fall Precaution Comments: monitor vitals Restrictions Weight Bearing Restrictions: No       Mobility Bed Mobility Overal bed mobility: Needs Assistance Bed Mobility: Rolling, Sidelying to Sit, Sit to Sidelying Rolling: Mod assist       Sit to sidelying: +2 for physical assistance, Max assist General bed mobility comments: pt following all instructions to assist with rolling, pushing up to sit and return to sidelying    Transfers Overall transfer level: Needs assistance Equipment used: Ambulation equipment used Transfers: Sit to/from Stand Sit to Stand: +2 physical assistance, +2 safety/equipment, From elevated surface, Max assist           General transfer comment: stood twice from EOB with assistance of 2 Transfer via Lift Equipment: Stedy   Balance Overall balance assessment: Needs assistance, History of Falls Sitting-balance support: Feet supported, Bilateral upper extremity supported Sitting balance-Leahy Scale: Poor Sitting balance - Comments: up to 10 minutes total; pt with feet on stedy and holding onto rail of stedy with min guard assist Postural control: Right lateral lean Standing balance support: Bilateral upper extremity supported, Reliant on assistive device for balance Standing balance-Leahy Scale: Poor Standing balance comment: reliant upon BUE support                           ADL either performed or assessed with clinical judgement   ADL                                         General  ADL Comments: focused on EOB sitting balance and standing    Extremity/Trunk Assessment Upper Extremity Assessment RUE Deficits / Details: performed AAROM shoulder, less edematous, + crepitus LUE Deficits / Details: AAROM shoulder, AROM elbow, remains edematous, moved BP cuff to R UE            Vision       Perception     Praxis       Cognition Arousal/Alertness: Awake/alert Behavior During Therapy: Flat affect Overall Cognitive Status: Impaired/Different from baseline Area of Impairment: Problem solving, Attention                   Current Attention Level: Sustained         Problem Solving: Slow processing, Decreased initiation, Difficulty sequencing, Requires verbal cues, Requires tactile cues General Comments: in good spirits        Exercises      Shoulder Instructions       General Comments HR max 129    Pertinent Vitals/ Pain       Pain Assessment Pain Assessment: Faces Faces Pain Scale: Hurts whole lot Pain Location: buttocks Pain Descriptors / Indicators: Grimacing, Guarding, Discomfort Pain Intervention(s): Limited activity within patient's tolerance, Repositioned, Monitored during session  Home Living                                          Prior Functioning/Environment              Frequency  Min 2X/week        Progress Toward Goals  OT Goals(current goals can now be found in the care plan section)  Progress towards OT goals: Progressing toward goals  Acute Rehab OT Goals Patient Stated Goal: get better OT Goal Formulation: With patient Time For Goal Achievement: 05/05/22 Potential to Achieve Goals: Fair ADL Goals Pt Will Perform Eating: with min assist;sitting;bed level Pt Will Perform Grooming: with min assist;sitting;bed level Pt Will Perform Lower Body Dressing: with modified independence;with adaptive equipment;sit to/from stand;sitting/lateral leans Pt Will Transfer to Toilet: with modified independence;regular height toilet;ambulating Pt Will Perform Toileting - Clothing Manipulation and hygiene: with modified independence;sit to/from stand;sitting/lateral leans Pt/caregiver will Perform Home Exercise Program: Increased ROM;Increased strength;Both right and left upper extremity;With minimal assist Additional ADL Goal #1: Pt will roll  with moderate assistance for pericare and positioning. Additional ADL Goal #2: Pt will demonstrate fair sitting balance x 10 minutes at a precursor to ADLs.  Plan Discharge plan remains appropriate    Co-evaluation    PT/OT/SLP Co-Evaluation/Treatment: Yes Reason for Co-Treatment: Complexity of the patient's impairments (multi-system involvement);For patient/therapist safety;To address functional/ADL transfers PT goals addressed during session: Mobility/safety with mobility;Balance;Proper use of DME OT goals addressed during session: Strengthening/ROM      AM-PAC OT "6 Clicks" Daily Activity     Outcome Measure   Help from another person eating meals?: A Lot Help from another person taking care of personal grooming?: Total Help from another person toileting, which includes using toliet, bedpan, or urinal?: Total Help from another person bathing (including washing, rinsing, drying)?: Total Help from another person to put on and taking off regular upper body clothing?: Total Help from another person to put on and taking off regular lower body clothing?: Total 6 Click Score: 7    End of Session Equipment Utilized During Treatment: Other (comment) Charlaine Dalton)  OT Visit Diagnosis: Muscle weakness (generalized) (  M62.81);Pain   Activity Tolerance Patient limited by pain   Patient Left in bed;with call bell/phone within reach   Nurse Communication Mobility status        Time: 0927-0950 OT Time Calculation (min): 23 min  Charges: OT General Charges $OT Visit: 1 Visit OT Treatments $Therapeutic Activity: 8-22 mins  Lodema Hong, [place]  Pager [phone] Office [place] 05/04/2022, 1:17 PM

## 2022-05-04 NOTE — Progress Notes (Signed)
Physical Therapy Treatment Patient Details Name: Christian [name] MRN: [id number] DOB: 07-05-1951 Today's Date: 05/04/2022   History of Present Illness 71 y.o. male presented 5/2 with worsening strength , new decubitus ulcers and poor intake.  Recent admission for cellulitis and hypoglycemia, had just returned to ALF from SNF a week ago but not OOB since then.  Has new hypoglyemia, elevation of K+, creatinine, CK.  Ulcer is not infected but has LE skin is also irritated and has skin breakdown. LEFt UE swollen and Doppler ordered 5/15 to r/o DVT.  PMHx:  chronic venous stasis and chronic right shin unhealing wound, chronic HFpEF, IDDM, CAD with stenting x2 in 2021, PVD, CKD stage IIIa, HTN, DVT on Xarelto, bipolar disorder, OSA on CPAP at bedtime    PT Comments    Patient in better spirits today and with less reports of buttocks pain. Able to sit EOB while holding onto stedy for a total of ~10 minutes with minguard assist. Able to work on standing x 2 reps with pt achieving ~1/2 standing each time (buttocks fully clearing the bed) and holding for up to 30 seconds. Continues to be motivated and make progress.     Recommendations for follow up therapy are one component of a multi-disciplinary discharge planning process, led by the attending physician.  Recommendations may be updated based on patient status, additional functional criteria and insurance authorization.  Follow Up Recommendations  Skilled nursing-short term rehab (<3 hours/day)     Assistance Recommended at Discharge Frequent or constant Supervision/Assistance  Patient can return home with the following Two people to help with walking and/or transfers;Assistance with cooking/housework;Assist for transportation;Help with stairs or ramp for entrance;Two people to help with bathing/dressing/bathroom;Assistance with feeding;Direct supervision/assist for medications management;Direct supervision/assist for financial management   Equipment  Recommendations  Other (comment) (hoyer lift)    Recommendations for Other Services       Precautions / Restrictions Precautions Precautions: Fall Precaution Comments: monitor vitals Restrictions Weight Bearing Restrictions: No     Mobility  Bed Mobility Overal bed mobility: Needs Assistance Bed Mobility: Rolling, Sidelying to Sit, Sit to Sidelying Rolling: Mod assist Sidelying to sit: Max assist, +2 for physical assistance     Sit to sidelying: +2 for physical assistance, Max assist General bed mobility comments: pt following all instructions to assist with rolling, pushing up to sit and return to sidelying    Transfers Overall transfer level: Needs assistance Equipment used: Ambulation equipment used Transfers: Sit to/from Stand Sit to Stand: +2 physical assistance, +2 safety/equipment, From elevated surface, Max assist           General transfer comment: stood twice from EOB with pt initiating; only able to achieve ~1/2 standing but with buttocks fully clear of mattress; stood ~30 seconds each time Transfer via Lift Equipment: Stedy  Ambulation/Gait               General Gait Details: unable   Chief Strategy Officer    Modified Rankin (Stroke Patients Only)       Balance Overall balance assessment: Needs assistance, History of Falls Sitting-balance support: Feet supported, Bilateral upper extremity supported Sitting balance-Leahy Scale: Poor Sitting balance - Comments: up to 10 minutes total; pt with feet on stedy and holding onto rail of stedy with min guard assist Postural control: Right lateral lean Standing balance support: Bilateral upper extremity supported, Reliant on assistive device for balance Standing balance-Leahy Scale: Poor  Standing balance comment: reliant upon BUE support                            Cognition Arousal/Alertness: Awake/alert Behavior During Therapy: Flat affect Overall Cognitive  Status: Impaired/Different from baseline Area of Impairment: Problem solving, Attention                   Current Attention Level: Sustained         Problem Solving: Slow processing, Decreased initiation, Difficulty sequencing, Requires verbal cues, Requires tactile cues          Exercises Other Exercises Other Exercises: worked on midline posture    General Comments General comments (skin integrity, edema, etc.): HR max 129      Pertinent Vitals/Pain Pain Assessment Pain Assessment: Faces Faces Pain Scale: Hurts whole lot Pain Location: buttocks Pain Descriptors / Indicators: Grimacing, Guarding, Discomfort Pain Intervention(s): Limited activity within patient's tolerance, Monitored during session, Repositioned    Home Living                          Prior Function            PT Goals (current goals can now be found in the care plan section) Acute Rehab PT Goals Patient Stated Goal: not stated PT Goal Formulation: With patient Time For Goal Achievement: 05/12/22 Potential to Achieve Goals: Fair Progress towards PT goals: Progressing toward goals    Frequency    Min 2X/week      PT Plan Current plan remains appropriate    Co-evaluation PT/OT/SLP Co-Evaluation/Treatment: Yes Reason for Co-Treatment: Complexity of the patient's impairments (multi-system involvement);For patient/therapist safety;To address functional/ADL transfers PT goals addressed during session: Mobility/safety with mobility;Balance;Proper use of DME        AM-PAC PT "6 Clicks" Mobility   Outcome Measure  Help needed turning from your back to your side while in a flat bed without using bedrails?: A Lot Help needed moving from lying on your back to sitting on the side of a flat bed without using bedrails?: Total Help needed moving to and from a bed to a chair (including a wheelchair)?: Total Help needed standing up from a chair using your arms (e.g., wheelchair or  bedside chair)?: Total Help needed to walk in hospital room?: Total Help needed climbing 3-5 steps with a railing? : Total 6 Click Score: 7    End of Session   Activity Tolerance: Patient limited by fatigue Patient left: in bed;with call bell/phone within reach Nurse Communication: Mobility status (positioned on pts left side with prevalon boots in place) PT Visit Diagnosis: Unsteadiness on feet (R26.81);History of falling (Z91.81);Repeated falls (R29.6);Difficulty in walking, not elsewhere classified (R26.2)     Time: [id number] PT Time Calculation (min) (ACUTE ONLY): 23 min  Charges:  $Therapeutic Activity: 8-22 mins                      Arby Barrette, PT Acute Rehabilitation Services  Office [phone]    Rexanne Mano 05/04/2022, 10:02 AM

## 2022-05-04 NOTE — Care Management (Signed)
Spoke w attending, Dr Jonathon Bellows. Agreed to referral to WOC to assess for wound VAC placement.  Paged Midge Aver WOC RN.

## 2022-05-05 DIAGNOSIS — R7881 Bacteremia: Secondary | ICD-10-CM | POA: Diagnosis not present

## 2022-05-05 DIAGNOSIS — R627 Adult failure to thrive: Secondary | ICD-10-CM | POA: Diagnosis not present

## 2022-05-05 DIAGNOSIS — L89152 Pressure ulcer of sacral region, stage 2: Secondary | ICD-10-CM | POA: Diagnosis not present

## 2022-05-05 DIAGNOSIS — R748 Abnormal levels of other serum enzymes: Secondary | ICD-10-CM | POA: Diagnosis not present

## 2022-05-05 LAB — GLUCOSE, CAPILLARY
Glucose-Capillary: 341 mg/dL — ABNORMAL HIGH (ref 70–99)
Glucose-Capillary: 351 mg/dL — ABNORMAL HIGH (ref 70–99)
Glucose-Capillary: 320 mg/dL — ABNORMAL HIGH (ref 70–99)
Glucose-Capillary: 301 mg/dL — ABNORMAL HIGH (ref 70–99)
Glucose-Capillary: 234 mg/dL — ABNORMAL HIGH (ref 70–99)

## 2022-05-05 LAB — BASIC METABOLIC PANEL
Anion gap: 5 (ref 5–15)
BUN: 38 mg/dL — ABNORMAL HIGH (ref 8–23)
CO2: 29 mmol/L (ref 22–32)
Calcium: 9.6 mg/dL (ref 8.9–10.3)
Chloride: 99 mmol/L (ref 98–111)
Creatinine, Ser: 0.98 mg/dL (ref 0.61–1.24)
GFR, Estimated: >60 mL/min (ref >60)
Glucose, Bld: 295 mg/dL — ABNORMAL HIGH (ref 70–99)
Potassium: 4.8 mmol/L (ref 3.5–5.1)
Sodium: 133 mmol/L — ABNORMAL LOW (ref 135–145)

## 2022-05-05 LAB — CBC
HCT: 39.9 % (ref 39.0–52.0)
Hemoglobin: 12.8 g/dL — ABNORMAL LOW (ref 13.0–17.0)
MCH: 27.6 pg (ref 26.0–34.0)
MCHC: 32.1 g/dL (ref 30.0–36.0)
MCV: 86.2 fL (ref 80.0–100.0)
Platelets: 244 10*3/uL (ref 150–400)
RBC: 4.63 MIL/uL (ref 4.22–5.81)
RDW: 17 % — ABNORMAL HIGH (ref 11.5–15.5)
WBC: 4.4 10*3/uL (ref 4.0–10.5)
nRBC: 0 % (ref 0.0–0.2)

## 2022-05-05 LAB — ZINC: Zinc: 82 ug/dL (ref 44–115)

## 2022-05-05 LAB — MAGNESIUM: Magnesium: 2 mg/dL (ref 1.7–2.4)

## 2022-05-05 LAB — PHOSPHORUS: Phosphorus: 2.2 mg/dL — ABNORMAL LOW (ref 2.5–4.6)

## 2022-05-05 LAB — VITAMIN A: Vitamin A (Retinoic Acid): 36.4 ug/dL (ref 22.0–69.5)

## 2022-05-05 MED ORDER — INSULIN ASPART 100 UNIT/ML IJ SOLN
0.0000 [IU] | INTRAMUSCULAR | Status: DC
  Administered 2022-05-05 (×2): 11 [IU] via SUBCUTANEOUS
  Administered 2022-05-06: 3 [IU] via SUBCUTANEOUS
  Administered 2022-05-06: 15 [IU] via SUBCUTANEOUS
  Administered 2022-05-06: 8 [IU] via SUBCUTANEOUS
  Administered 2022-05-06: 5 [IU] via SUBCUTANEOUS
  Administered 2022-05-06 (×2): 8 [IU] via SUBCUTANEOUS
  Administered 2022-05-07: 15 [IU] via SUBCUTANEOUS
  Administered 2022-05-07: 8 [IU] via SUBCUTANEOUS
  Administered 2022-05-07: 5 [IU] via SUBCUTANEOUS
  Administered 2022-05-07: 8 [IU] via SUBCUTANEOUS
  Administered 2022-05-07: 11 [IU] via SUBCUTANEOUS
  Administered 2022-05-07 – 2022-05-08 (×2): 8 [IU] via SUBCUTANEOUS
  Administered 2022-05-08: 5 [IU] via SUBCUTANEOUS
  Administered 2022-05-08: 8 [IU] via SUBCUTANEOUS
  Administered 2022-05-08: 3 [IU] via SUBCUTANEOUS
  Administered 2022-05-08 – 2022-05-09 (×3): 8 [IU] via SUBCUTANEOUS
  Administered 2022-05-09 (×2): 5 [IU] via SUBCUTANEOUS

## 2022-05-05 MED ORDER — INSULIN ASPART 100 UNIT/ML IJ SOLN: 0.0000 [IU] | INTRAMUSCULAR | Status: DC

## 2022-05-05 NOTE — Progress Notes (Addendum)
PROGRESS NOTE        PATIENT DETAILS Name: Christian [name] Age: 71 y.o. Sex: male Date of Birth: [birth year] Admit Date: 03/30/2022 Admitting Physician Rise Patience, MD PCP:Sun, Gari Crown, MD  Brief Summary: Patient is a 71 y.o.  male with a history of A-fib on Xarelto, HFpEF, PAD, DM-2, bipolar disorder-presented with generalized weakness/sacral decubitus ulcer-was found to have hypoglycemia, hyperkalemia and subsequently admitted to the hospitalist service-see below for further details.  Thought to have extrapyramidal symptoms-evaluated by psych/neurology-psych medications adjusted with clinical improvement.  Unfortunately-further hospital course complicated by development of fever-due to probable Streptococcus bacteremia and infected decubitus ulcer.  See below for further details.  Significant events: 5/2>> admit to [facility] for weakness/hypoglycemia/hyperkalemia/rigidity-thought to have EPS 5/14>> febrile/tachycardic-Per nursing staff-discharge from prior existing sacral decubitus ulcer.  Started on broad-spectrum IV antibiotics.  Prelim blood culture positive for Streptococcus species. 5/19>> wound care starting hydrotherapy 6/05>>Cortak tube placed   Significant studies: 5/3>> x-ray pelvis: No fracture. 5/3>> CT head: No acute intracranial findings. 5/3>> x-ray left shoulder: Rotator cuff impingement 5/14>> CXR:No Obvious PNA 5/15>> left upper extremity Doppler: No DVT. 5/16>> CT abdomen/pelvis: Inferior sacral decubitus ulceration-no evidence of osteomyelitis. 5/16>> Echo: EF 45-50%, no obvious vegetations.  Significant microbiology data: 5/14>> blood culture: Streptococcus constellatus  5/15>> blood culture: No growth  Procedures:   Consults: Neurology, psychiatry, infectious disease,CCS  Subjective: Awake/alert-no major issues this morning.  Low-grade fever.  Objective: Vitals: Blood pressure 126/77, pulse 92, temperature 98.9 F (37.2  C), temperature source Axillary, resp. rate 16, height 6' (1.829 m), weight 100.4 kg, SpO2 98 %.   Exam: Gen Exam:Alert awake-not in any distress HEENT:atraumatic, normocephalic Chest: B/L clear to auscultation anteriorly CVS:S1S2 regular Abdomen:soft non tender, non distended Extremities:no edema-decrease in LUE swelling. Neurology: Non focal-but with generalized weakness. Skin: no rash   Pertinent Labs/Radiology:    Latest Ref Rng & Units 05/05/2022    6:16 AM 05/02/2022    2:00 AM 04/30/2022    3:10 AM  CBC  WBC 4.0 - 10.5 K/uL 4.4   5.4   5.1    Hemoglobin 13.0 - 17.0 g/dL 12.8   12.9   12.6    Hematocrit 39.0 - 52.0 % 39.9   39.9   39.2    Platelets 150 - 400 K/uL 244   291   310      Lab Results  Component Value Date   NA 133 (L) 05/05/2022   K 4.8 05/05/2022   CL 99 05/05/2022   CO2 29 05/05/2022      Assessment/Plan: Rigidity/tremors/mildly elevated CK: Felt to be more of extrapyramidal syndrome rather than NMS at this point.  Managed with supportive care.  After neurology/psych evaluation-has been resumed on Abilify and Cogentin-Wellbutrin/Seroquel has been discontinued.  AKI on CKD stage IIIa: AKI likely hemodynamically mediated-improved-creatinine close to baseline.   Mild rhabdomyolysis: Nontraumatic-improved with supportive care.  Sepsis due to possible strep bacteremia-sacral decubitus ulcer with sepsis: Infection: Sepsis physiology has resolved-has completed a course of antimicrobial therapy-wound care following-hydrotherapy ongoing-wound care will assess today for possible wound VAC placement.  Acute metabolic encephalopathy: Due to sepsis-has resolved-relatively awake and alert.    Hyperkalemia: resolved  Hypoglycemia: Resolved with supportive care.  Do not resume Amaryl on discharge.  DM-2 (A1c 7.6 on 4/15): CBG relatively stable-avoiding tight glycemic control-at risk for hypoglycemia-continue Semglee 17 units and  SSI.  Recent Labs    05/04/22 1605  05/04/22 1944 05/04/22 2113  GLUCAP 292* 259* 261*     Hypothyroidism: Continue Cytomel.  PAF: Rate controlled-continue Xarelto  PAD: Follow-up with vascular surgery in the outpatient setting  HFpEF: Volume status stable-suspect has some amount of edema in lower extremities due to third spacing.  Demadex remains on hold.  Bipolar disorder: Given concerns for EPS-on Abilify and Cogentin-Depakote/Wellbutrin and Seroquel held.  Left shoulder pain: Supportive care-likely rotator cuff injury.  Stable to be followed up by orthopedics in the outpatient setting  Left arm swelling: Likely due to IVF infiltration-could have superficial venous thrombosis-left upper extremity Doppler negative for DVT.  Supportive local care at this point.  OSA: CPAP nightly  Acute urinary retention: Failed voiding trial on 6/5-continue Flomax-Foley catheter remains in place-we will plan on continuing Foley catheter to prevent soilage of sacral wound.  Debility/deconditioning: Has had worsening of his chronic debility during this hospitalization-plans are of SNF on discharge.  Per family-he has been nonambulatory since his discharge from [facility] in April 2023.  This likely resulted in decubitus ulcer that the patient presented with.  Failure to thrive syndrome/nutrition Status: core track tube in place due to poor oral intake.  Continue NG tube feedings.  Encourage oral intake.  Nutrition Problem: Increased nutrient needs Etiology: chronic illness, wound healing Signs/Symptoms: estimated needs Interventions: Ensure Enlive (each supplement provides 350kcal and 20 grams of protein), Tube feeding, MVI  Pressure Ulcer: Pressure Injury 03/31/22 Sacrum Mid Unstageable - Full thickness tissue loss in which the base of the injury is covered by slough (yellow, tan, gray, green or brown) and/or eschar (tan, brown or black) in the wound bed. (Active)  03/31/22 1933  Location: Sacrum  Location Orientation: Mid   Staging: Unstageable - Full thickness tissue loss in which the base of the injury is covered by slough (yellow, tan, gray, green or brown) and/or eschar (tan, brown or black) in the wound bed.  Wound Description (Comments):   Present on Admission: Yes  Dressing Type Foam - Lift dressing to assess site every shift;Gauze (Comment);Silver hydrofiber 05/05/22 0430   Obesity: Estimated body mass index is 30.03 kg/m as calculated from the following:   Height as of this encounter: 6' (1.829 m).   Weight as of this encounter: 100.4 kg.   Code status:   Code Status: Full Code   DVT Prophylaxis: rivaroxaban (XARELTO) tablet 20 mg    Family Communication: None at bedside.  We will update daughter-[name]-[phone]-over the next few days.   Disposition Plan: Status is: Inpatient Remains inpatient appropriate because: Not stable for discharge-resolving sepsis physiology due to streptococcal bacteremia/infected sacral decubitus ulcer-continues to have low-grade fever-on IV daptomycin/Augmentin.  Hydrotherapy to be started on 5/19.   Planned Discharge Destination:Skilled nursing facility versus long-term acute care hospital.   Diet: Diet Order             Diet regular Room service appropriate? No; Fluid consistency: Thin  Diet effective now                     Antimicrobial agents: Anti-infectives (From admission, onward)    Start     Dose/Rate Route Frequency Ordered Stop   04/22/22 1015  linezolid (ZYVOX) tablet 600 mg        600 mg Oral Every 12 hours 04/22/22 0916 04/26/22 2110   04/15/22 0900  vancomycin (VANCOREADY) IVPB 1500 mg/300 mL  Status:  Discontinued  1,500 mg 150 mL/hr over 120 Minutes Intravenous Every 24 hours 04/14/22 0811 04/14/22 1005   04/14/22 2200  amoxicillin-clavulanate (AUGMENTIN) 875-125 MG per tablet 1 tablet        1 tablet Oral Every 12 hours 04/14/22 1331 04/23/22 2148   04/14/22 1100  DAPTOmycin (CUBICIN) 700 mg in sodium  chloride 0.9 % IVPB  Status:  Discontinued        8 mg/kg  90.4 kg (Adjusted) 128 mL/hr over 30 Minutes Intravenous Daily 04/14/22 1005 04/22/22 0916   04/14/22 0900  vancomycin (VANCOREADY) IVPB 2000 mg/400 mL  Status:  Discontinued        2,000 mg 200 mL/hr over 120 Minutes Intravenous  Once 04/14/22 0811 04/14/22 1005   04/13/22 2200  metroNIDAZOLE (FLAGYL) tablet 500 mg  Status:  Discontinued        500 mg Oral Every 12 hours 04/13/22 1436 04/14/22 1331   04/12/22 0815  cefTRIAXone (ROCEPHIN) 2 g in sodium chloride 0.9 % 100 mL IVPB  Status:  Discontinued        2 g 200 mL/hr over 30 Minutes Intravenous Daily 04/12/22 0717 04/14/22 0811   04/12/22 0600  vancomycin (VANCOREADY) IVPB 1250 mg/250 mL  Status:  Discontinued        1,250 mg 166.7 mL/hr over 90 Minutes Intravenous Every 24 hours 04/11/22 0521 04/12/22 0717   04/11/22 1145  metroNIDAZOLE (FLAGYL) IVPB 500 mg  Status:  Discontinued        500 mg 100 mL/hr over 60 Minutes Intravenous Every 12 hours 04/11/22 1055 04/13/22 1436   04/11/22 0600  ceFEPIme (MAXIPIME) 2 g in sodium chloride 0.9 % 100 mL IVPB  Status:  Discontinued        2 g 200 mL/hr over 30 Minutes Intravenous Every 8 hours 04/11/22 0437 04/12/22 0717   04/11/22 0530  vancomycin (VANCOREADY) IVPB 2000 mg/400 mL        2,000 mg 200 mL/hr over 120 Minutes Intravenous  Once 04/11/22 0437 04/11/22 2008        MEDICATIONS: Scheduled Meds:  ARIPiprazole  10 mg Oral q morning   atorvastatin  10 mg Oral Daily   benztropine  1 mg Oral BID   Chlorhexidine Gluconate Cloth  6 each Topical Daily   diclofenac Sodium  2 g Topical QID   feeding supplement  237 mL Oral TID BM   insulin aspart  0-9 Units Subcutaneous TID WC   insulin glargine-yfgn  17 Units Subcutaneous Daily   liothyronine  10 mcg Oral Daily   mouth rinse  15 mL Mouth Rinse BID   metoprolol succinate  50 mg Oral Daily   multivitamin with minerals  1 tablet Oral Daily   nutrition supplement (JUVEN)  1  packet Oral BID BM   rivaroxaban  20 mg Oral Q supper   senna-docusate  2 tablet Oral BID   tamsulosin  0.4 mg Oral Daily   Continuous Infusions:  feeding supplement (JEVITY 1.5 CAL/FIBER) 1,000 mL (05/04/22 2000)   PRN Meds:.acetaminophen **OR** acetaminophen, bisacodyl, HYDROcodone-acetaminophen, melatonin, morphine injection   I have personally reviewed following labs and imaging studies  LABORATORY DATA: CBC: Recent Labs  Lab 04/30/22 0310 05/02/22 0200 05/05/22 0616  WBC 5.1 5.4 4.4  HGB 12.6* 12.9* 12.8*  HCT 39.2 39.9 39.9  MCV 85.4 85.8 86.2  PLT 310 291 244     Basic Metabolic Panel: Recent Labs  Lab 04/30/22 0310 05/02/22 0200 05/03/22 0115 05/03/22 1900 05/04/22 0549 05/04/22 1535 05/05/22  0616  NA 133* 134* 131*  --   --   --  133*  K 4.3 4.8 4.9  --   --   --  4.8  CL 98 99 98  --   --   --  99  CO2 25 27 25   --   --   --  29  GLUCOSE 134* 238* 151*  --   --   --  295*  BUN 27* 38* 42*  --   --   --  38*  CREATININE 1.02 1.25* 1.04  --   --   --  0.98  CALCIUM 9.4 9.7 9.3  --   --   --  9.6  MG  --   --   --  2.0 2.1 2.0 2.0  PHOS  --   --   --  2.5 2.8 1.9* 2.2*     GFR: Estimated Creatinine Clearance: 88.5 mL/min (by C-G formula based on SCr of 0.98 mg/dL).  Liver Function Tests: No results for input(s): AST, ALT, ALKPHOS, BILITOT, PROT, ALBUMIN in the last 168 hours.  No results for input(s): LIPASE, AMYLASE in the last 168 hours. No results for input(s): AMMONIA in the last 168 hours.  Coagulation Profile: No results for input(s): INR, PROTIME in the last 168 hours.  Cardiac Enzymes: No results for input(s): CKTOTAL, CKMB, CKMBINDEX, TROPONINI in the last 168 hours.   BNP (last 3 results) No results for input(s): PROBNP in the last 8760 hours.  Lipid Profile: No results for input(s): CHOL, HDL, LDLCALC, TRIG, CHOLHDL, LDLDIRECT in the last 72 hours.  Thyroid Function Tests: No results for input(s): TSH, T4TOTAL, FREET4,  T3FREE, THYROIDAB in the last 72 hours.  Anemia Panel: No results for input(s): VITAMINB12, FOLATE, FERRITIN, TIBC, IRON, RETICCTPCT in the last 72 hours.  Urine analysis:    Component Value Date/Time   COLORURINE YELLOW 04/11/2022 0540   APPEARANCEUR HAZY (A) 04/11/2022 0540   APPEARANCEUR Cloudy 08/01/2014 1614   LABSPEC 1.017 04/11/2022 0540   LABSPEC 1.008 08/01/2014 1614   PHURINE 7.0 04/11/2022 0540   GLUCOSEU >=500 (A) 04/11/2022 0540   GLUCOSEU Negative 08/01/2014 1614   HGBUR NEGATIVE 04/11/2022 0540   BILIRUBINUR NEGATIVE 04/11/2022 0540   BILIRUBINUR Negative 08/01/2014 1614   KETONESUR NEGATIVE 04/11/2022 0540   PROTEINUR NEGATIVE 04/11/2022 0540   UROBILINOGEN 0.2 07/01/2014 1809   NITRITE NEGATIVE 04/11/2022 0540   LEUKOCYTESUR NEGATIVE 04/11/2022 0540   LEUKOCYTESUR Negative 08/01/2014 1614    Sepsis Labs: Lactic Acid, Venous    Component Value Date/Time   LATICACIDVEN 1.8 04/11/2022 0651    MICROBIOLOGY: No results found for this or any previous visit (from the past 240 hour(s)).   RADIOLOGY STUDIES/RESULTS: DG Abd Portable 1V  Result Date: 05/03/2022 CLINICAL DATA:  Feeding tube placement EXAM: PORTABLE ABDOMEN - 1 VIEW COMPARISON:  04/13/2022 FINDINGS: Limited radiograph of the upper abdomen was obtained for the purposes of enteric tube localization. Enteric tube is seen coursing below the diaphragm with distal tip terminating within the expected location of the gastric body. IMPRESSION: Enteric tube tip terminating within the gastric body. Electronically Signed   By: Davina Poke D.O.   On: 05/03/2022 16:30     LOS: 35 days   Oren Binet, MD  [facility]    To contact the attending provider between 7A-7P or the covering provider during after hours 7P-7A, please log into the web site [email or web address] and access using universal [place] password for that web site.  If you do not have the password, please call the hospital  operator.  05/05/2022, 10:23 AM

## 2022-05-05 NOTE — Progress Notes (Addendum)
Hydrotherapy Cancellation & Discharge Note  Patient Details Name: VITOR SONDGEROTH MRN: [id number] DOB: [birth year]   Cancelled Treatment:     Discussed plan with WOC RN Domenic Moras in regards to pt's wound being managed by a NPWT Vac at this time. Will plan to discontinue hydrotherapy at this time. Please re-consult hydrotherapy if needed at a later date.   Moishe Spice, PT, DPT Acute Rehabilitation Services  Office: [place] 05/05/2022, 5:08 PM

## 2022-05-05 NOTE — Consult Note (Signed)
WOC Nurse Consult Note: Reason for Consult:Asked to reconsult for sacral wound and consider NPWT (VAC) therapy. Has been receiving hydrotherapy, wound is clean and would benefit from negative pressure.  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  Pressure Injury POA: Yes Measurement: 9 cm x 9 cm x 3 cm with tunneling towards coccyx that extends 8.5 cm.  Wound bed: pale pink, nongranulating.  Some yellow fibrinous slough noted.   Drainage (amount, consistency, odor) moderate serosanguinous  silver hydrofiber packing removed that is saturated with effluent.  Periwound: rectum in close proximity.  Barrier rings to distal end to help create a barrier.  Stool is soft but not liquid at this time.   Dressing procedure/placement/frequency: Cleanse with NS.  Apply 1 piece foam to wound bed.  Skin is protected and an additional piece bridged to right trochanter to offload pressure. Covered with drape and seal is immediately achieved.  Patient had minimal complaints during procedure.  Will follow.  Domenic Moras MSN, RN, FNP-BC CWON Wound, Ostomy, Continence Nurse Pager [phone]

## 2022-05-05 NOTE — Progress Notes (Signed)
Inpatient Diabetes Program Recommendations  AACE/ADA: New Consensus Statement on Inpatient Glycemic Control (2015)  Target Ranges:  Prepandial:   less than 140 mg/dL      Peak postprandial:   less than 180 mg/dL (1-2 hours)      Critically ill patients:  140 - 180 mg/dL   Lab Results  Component Value Date   GLUCAP 261 (H) 05/04/2022   HGBA1C 7.6 (H) 03/13/2022    Review of Glycemic Control  Latest Reference Range & Units 05/04/22 16:05 05/04/22 19:44 05/04/22 21:13  Glucose-Capillary 70 - 99 mg/dL 749 (H) 449 (H) 675 (H)  (H): Data is abnormally high Diabetes history: DM 2 Outpatient Diabetes medications:  Jardiance 10 mg daily, Amaryl 2 mg daily, Lantus 5 units daily Current orders for Inpatient glycemic control:  Novolog 0-9 units tid with meals Jevity 1.5 at 60 ml/hr Semglee 17 units daily   Inpatient Diabetes Program Recommendations:     Please increase frequency of Novolog correction to q 4 hours while on tube feeds.   Thanks, Lujean Rave, MSN, RNC-OB Diabetes Coordinator [phone] (8a-5p)

## 2022-05-06 DIAGNOSIS — L89152 Pressure ulcer of sacral region, stage 2: Secondary | ICD-10-CM | POA: Diagnosis not present

## 2022-05-06 DIAGNOSIS — R748 Abnormal levels of other serum enzymes: Secondary | ICD-10-CM | POA: Diagnosis not present

## 2022-05-06 DIAGNOSIS — R627 Adult failure to thrive: Secondary | ICD-10-CM | POA: Diagnosis not present

## 2022-05-06 DIAGNOSIS — R7881 Bacteremia: Secondary | ICD-10-CM | POA: Diagnosis not present

## 2022-05-06 LAB — GLUCOSE, CAPILLARY
Glucose-Capillary: 186 mg/dL — ABNORMAL HIGH (ref 70–99)
Glucose-Capillary: 225 mg/dL — ABNORMAL HIGH (ref 70–99)
Glucose-Capillary: 272 mg/dL — ABNORMAL HIGH (ref 70–99)
Glucose-Capillary: 274 mg/dL — ABNORMAL HIGH (ref 70–99)
Glucose-Capillary: 287 mg/dL — ABNORMAL HIGH (ref 70–99)
Glucose-Capillary: 376 mg/dL — ABNORMAL HIGH (ref 70–99)

## 2022-05-06 LAB — VITAMIN C: Vitamin C: 2.5 mg/dL — ABNORMAL HIGH (ref 0.4–2.0)

## 2022-05-06 NOTE — Progress Notes (Signed)
Inpatient Diabetes Program Recommendations  AACE/ADA: New Consensus Statement on Inpatient Glycemic Control (2015)  Target Ranges:  Prepandial:   less than 140 mg/dL      Peak postprandial:   less than 180 mg/dL (1-2 hours)      Critically ill patients:  140 - 180 mg/dL   Lab Results  Component Value Date   GLUCAP 225 (H) 05/06/2022   HGBA1C 7.6 (H) 03/13/2022    Review of Glycemic Control  Latest Reference Range & Units 05/05/22 21:20 05/06/22 00:18 05/06/22 04:43 05/06/22 09:03  Glucose-Capillary 70 - 99 mg/dL 234 (H) 186 (H) 272 (H) 225 (H)  (H): Data is abnormally high Diabetes history: DM 2 Outpatient Diabetes medications:  Jardiance 10 mg daily, Amaryl 2 mg daily, Lantus 5 units daily Current orders for Inpatient glycemic control:  Novolog 0-15 units Q4H Jevity 1.5 at 60 ml/hr Semglee 17 units daily   Inpatient Diabetes Program Recommendations:     Secure chat sent to RN to verify tube feeds still running continuously and to verify intake of meals consumed.   If plan is to continue tube feeds consider adding Novolog 3 units Q4H for tube feed coverage (to be stopped or held in the event tube feeds stopped).   Thanks, Bronson Curb, MSN, RNC-OB Diabetes Coordinator [phone] (8a-5p)

## 2022-05-06 NOTE — TOC Progression Note (Signed)
Transition of Care [facility]) - Progression Note    Patient Details  Name: Christian [name] MRN: [id number] Date of Birth: 06/19/1951  Transition of Care [facility]) CM/SW [place], LCSW Phone Number: 05/06/2022, 8:52 AM  Clinical Narrative:    [facility] completing appeal now that patient has cortrak and wound vac. CSW faxed appeal letter to [facility].    Expected Discharge Plan: Skilled Nursing Facility Barriers to Discharge: Insurance Authorization  Expected Discharge Plan and Services Expected Discharge Plan: [place] In-house Referral: Clinical Social Work   Post Acute Care Choice: [place] Living arrangements for the past 2 months: [place], [place]                                       Social Determinants of Health (SDOH) Interventions    Readmission Risk Interventions    04/13/2022    3:06 PM 04/13/2022   10:43 AM 01/28/2022   10:17 AM  Readmission Risk Prevention Plan  Transportation Screening  Complete Complete  PCP or Specialist Appt within 5-7 Days   Complete  PCP or Specialist Appt within 3-5 Days Complete    Home Care Screening   Complete  Medication Review (RN CM)   Complete  HRI or Home Care Consult Complete    Social Work Consult for [place] Planning/Counseling Complete    Palliative Care Screening Not Applicable    Medication Review Press photographer) Referral to Pharmacy Complete   PCP or Specialist appointment within 3-5 days of discharge  Complete   HRI or [place]  Complete   SW Recovery Care/Counseling Consult  Complete   Palliative Care Screening  Not [place]  Complete

## 2022-05-06 NOTE — Progress Notes (Signed)
Occupational Therapy Treatment Patient Details Name: Christian [name] MRN: [id number] DOB: [birth year] Today's Date: 05/06/2022   History of present illness 71 y.o. male presented 5/2 with worsening strength , new decubitus ulcers and poor intake.  Recent admission for cellulitis and hypoglycemia, had just returned to ALF from SNF a week ago but not OOB since then.  Has new hypoglyemia, elevation of K+, creatinine, CK.  Ulcer is not infected but has LE skin is also irritated and has skin breakdown. LEFt UE swollen and Doppler ordered 5/15 to r/o DVT.  PMHx:  chronic venous stasis and chronic right shin unhealing wound, chronic HFpEF, IDDM, CAD with stenting x2 in 2021, PVD, CKD stage IIIa, HTN, DVT on Xarelto, bipolar disorder, OSA on CPAP at bedtime   OT comments  Pt requiring heavy +2 max assist side<>sit and standing with stedy and bed pad under hips. Unable to come to a full stand. Worked on self feeding with HOB up. Pt able to finger feed, drink with a straw and eat from a loaded spoon with L hand when placed. Pt spitting out regular textured food, RN and ST notified. ST has signed off. Daughter in room at end of session and observed pt self feeding.    Recommendations for follow up therapy are one component of a multi-disciplinary discharge planning process, led by the attending physician.  Recommendations may be updated based on patient status, additional functional criteria and insurance authorization.    Follow Up Recommendations  Skilled nursing-short term rehab (<3 hours/day)    Assistance Recommended at Discharge Frequent or constant Supervision/Assistance  Patient can return home with the following  Two people to help with walking and/or transfers;Two people to help with bathing/dressing/bathroom;Assistance with feeding;Assistance with cooking/housework;Direct supervision/assist for medications management;Direct supervision/assist for financial management;Assist for transportation;Help  with stairs or ramp for entrance   Equipment Recommendations  Other (comment) (defer to next venue)    Recommendations for Other Services      Precautions / Restrictions Precautions Precautions: Fall Precaution Comments: monitor HR Restrictions Weight Bearing Restrictions: No       Mobility Bed Mobility Overal bed mobility: Needs Assistance Bed Mobility: Rolling, Sidelying to Sit, Sit to Sidelying Rolling: Max assist Sidelying to sit: Max assist, +2 for physical assistance     Sit to sidelying: +2 for physical assistance, Max assist General bed mobility comments: pt following all instructions to assist with rolling, pushing up to sit and return to sidelying    Transfers Overall transfer level: Needs assistance Equipment used: Ambulation equipment used Transfers: Sit to/from Stand Sit to Stand: +2 physical assistance, Max assist           General transfer comment: stood from EOB with assistance of 2 and bed pad under pelvis, unable to maintain without max assist of bed pad under hips, flexed posture Transfer via Lift Equipment: Stedy   Balance Overall balance assessment: Needs assistance, History of Falls Sitting-balance support: Feet supported, Bilateral upper extremity supported Sitting balance-Leahy Scale: Poor   Postural control: Right lateral lean Standing balance support: Bilateral upper extremity supported, Reliant on assistive device for balance Standing balance-Leahy Scale: Poor                             ADL either performed or assessed with clinical judgement   ADL Overall ADL's : Needs assistance/impaired Eating/Feeding: Moderate assistance;Bed level Eating/Feeding Details (indicate cue type and reason): can finger feed, bring cup with straw to  mouth and loaded spoon to mouth Grooming: Oral care;Total assistance;Bed level Grooming Details (indicate cue type and reason): for denture cleaning and placing             Lower Body  Dressing: Total assistance;Bed level       Toileting- Clothing Manipulation and Hygiene: Total assistance Toileting - Clothing Manipulation Details (indicate cue type and reason): pt with bowel incontinence       General ADL Comments: pt unable to chew and swallow cheeseburger or french fries, no difficulty with pudding or drink, RN and ST notified    Extremity/Trunk Assessment Upper Extremity Assessment RUE Deficits / Details: performed AAROM shoulder, less edematous, + crepitus LUE Deficits / Details: AAROM shoulder, AROM elbow, remains edematous, moved BP cuff to R UE   Lower Extremity Assessment RLE Deficits / Details: knee ext -4/5 LLE Deficits / Details: knee ext -4/5   Cervical / Trunk Assessment Cervical / Trunk Assessment: Other exceptions Cervical / Trunk Exceptions: weaknss, obesity    Vision       Perception     Praxis      Cognition Arousal/Alertness: Awake/alert Behavior During Therapy: Flat affect Overall Cognitive Status: Impaired/Different from baseline Area of Impairment: Attention, Following commands, Problem solving, Memory                   Current Attention Level: Sustained Memory: Decreased short-term memory Following Commands: Follows one step commands with increased time     Problem Solving: Slow processing, Decreased initiation, Difficulty sequencing, Requires verbal cues, Requires tactile cues          Exercises      Shoulder Instructions       General Comments HR max 149 bpm after second standing attempt    Pertinent Vitals/ Pain       Pain Assessment Pain Assessment: Faces Faces Pain Scale: Hurts even more Pain Location: buttocks Pain Descriptors / Indicators: Grimacing, Guarding, Discomfort, Moaning Pain Intervention(s): Monitored during session, Repositioned  Home Living Family/patient expects to be discharged to:: Skilled nursing facility Living Arrangements: Other (Comment) (congregate care) Available Help at  Discharge: Available PRN/intermittently;Personal care attendant Type of Home: Assisted living Home Access: Level entry     Home Layout: One level     Bathroom Shower/Tub: Occupational psychologist: Handicapped height Bathroom Accessibility: Yes   Home Equipment: Rollator (4 wheels);Shower seat;Grab bars - toilet;Grab bars - tub/shower;Wheelchair - Education administrator (comment) (lift chair)   Additional Comments: asking for a hosptial bed      Prior Functioning/Environment              Frequency  Min 2X/week        Progress Toward Goals  OT Goals(current goals can now be found in the care plan section)  Progress towards OT goals: Progressing toward goals  Acute Rehab OT Goals OT Goal Formulation: With patient Time For Goal Achievement: 05/20/22 Potential to Achieve Goals: Fair ADL Goals Pt Will Perform Eating: with min assist;sitting;bed level Pt Will Perform Grooming: with mod assist;bed level Pt/caregiver will Perform Home Exercise Program: Increased ROM;Increased strength;Both right and left upper extremity;With minimal assist Additional ADL Goal #1: Pt will perform bed mobility with moderate assistance in preparation for ADLs Additional ADL Goal #2: Pt will demonstrate fair sitting balance x 10 minutes at a precursor to ADLs.  Plan Discharge plan remains appropriate    Co-evaluation    PT/OT/SLP Co-Evaluation/Treatment: Yes Reason for Co-Treatment: Complexity of the patient's impairments (multi-system involvement) PT goals  addressed during session: Mobility/safety with mobility;Balance;Strengthening/ROM OT goals addressed during session: ADL's and self-care      AM-PAC OT "6 Clicks" Daily Activity     Outcome Measure   Help from another person eating meals?: A Lot Help from another person taking care of personal grooming?: Total Help from another person toileting, which includes using toliet, bedpan, or urinal?: Total Help from another person  bathing (including washing, rinsing, drying)?: Total Help from another person to put on and taking off regular upper body clothing?: Total Help from another person to put on and taking off regular lower body clothing?: Total 6 Click Score: 7    End of Session    OT Visit Diagnosis: Muscle weakness (generalized) (M62.81);Pain   Activity Tolerance Patient tolerated treatment well   Patient Left in bed;with call bell/phone within reach;with family/visitor present   Nurse Communication Other (comment) (aware pt had BM, ok to use barrier cream on buttocks, pt unable to eat regular diet)        Time: [id number] OT Time Calculation (min): 68 min  Charges: OT General Charges $OT Visit: 1 Visit OT Treatments $Self Care/Home Management : 8-22 mins $Therapeutic Activity: 8-22 mins  Cleta Alberts, OTR/L Acute Rehabilitation Services Office: [phone]   Malka So 05/06/2022, 2:52 PM

## 2022-05-06 NOTE — Progress Notes (Signed)
PROGRESS NOTE        PATIENT DETAILS Name: Christian [name] Age: 71 y.o. Sex: male Date of Birth: [birth year] Admit Date: 03/30/2022 Admitting Physician Rise Patience, MD PCP:Sun, Gari Crown, MD  Brief Summary: Patient is a 71 y.o.  male with a history of A-fib on Xarelto, HFpEF, PAD, DM-2, bipolar disorder-presented with generalized weakness/sacral decubitus ulcer-was found to have hypoglycemia, hyperkalemia and subsequently admitted to the hospitalist service-see below for further details.  Thought to have extrapyramidal symptoms-evaluated by psych/neurology-psych medications adjusted with clinical improvement.  Unfortunately-further hospital course complicated by development of fever-due to probable Streptococcus bacteremia and infected decubitus ulcer.  See below for further details.  Significant events: 5/2>> admit to [facility] for weakness/hypoglycemia/hyperkalemia/rigidity-thought to have EPS 5/14>> febrile/tachycardic-Per nursing staff-discharge from prior existing sacral decubitus ulcer.  Started on broad-spectrum IV antibiotics.  Prelim blood culture positive for Streptococcus species. 5/19>> wound care starting hydrotherapy 6/05>>Cortak tube placed 6/07>>wound vac placed   Significant studies: 5/3>> x-ray pelvis: No fracture. 5/3>> CT head: No acute intracranial findings. 5/3>> x-ray left shoulder: Rotator cuff impingement 5/14>> CXR:No Obvious PNA 5/15>> left upper extremity Doppler: No DVT. 5/16>> CT abdomen/pelvis: Inferior sacral decubitus ulceration-no evidence of osteomyelitis. 5/16>> Echo: EF 45-50%, no obvious vegetations.  Significant microbiology data: 5/14>> blood culture: Streptococcus constellatus  5/15>> blood culture: No growth  Procedures:   Consults: Neurology, psychiatry, infectious disease,CCS  Subjective: Awake/alert-chronically frail appearing.  No major events overnight.  Objective: Vitals: Blood pressure 110/75, pulse  (!) 101, temperature 97.7 F (36.5 C), temperature source Oral, resp. rate (!) 24, height 6' (1.829 m), weight 136.1 kg, SpO2 97 %.   Exam: Gen Exam:Alert awake-not in any distress HEENT:atraumatic, normocephalic Chest: B/L clear to auscultation anteriorly CVS:S1S2 regular Abdomen:soft non tender, non distended Extremities:no edema Neurology: Generalized weakness-but nonfocal exam. Skin: no rash   Pertinent Labs/Radiology:    Latest Ref Rng & Units 05/05/2022    6:16 AM 05/02/2022    2:00 AM 04/30/2022    3:10 AM  CBC  WBC 4.0 - 10.5 K/uL 4.4  5.4  5.1   Hemoglobin 13.0 - 17.0 g/dL 12.8  12.9  12.6   Hematocrit 39.0 - 52.0 % 39.9  39.9  39.2   Platelets 150 - 400 K/uL 244  291  310     Lab Results  Component Value Date   NA 133 (L) 05/05/2022   K 4.8 05/05/2022   CL 99 05/05/2022   CO2 29 05/05/2022      Assessment/Plan: Rigidity/tremors/mildly elevated CK: Felt to be more of extrapyramidal syndrome rather than NMS at this point.  Managed with supportive care.  After neurology/psych evaluation-has been resumed on Abilify and Cogentin-Wellbutrin/Seroquel has been discontinued.  AKI on CKD stage IIIa: AKI likely hemodynamically mediated-improved-creatinine close to baseline.   Mild rhabdomyolysis: Nontraumatic-improved with supportive care.  Sepsis due to possible strep bacteremia-sacral decubitus ulcer with sepsis: Infection: Sepsis physiology has resolved-has completed a course of antimicrobial therapy-completed hydrotherapy-wound care placed wound VAC on 6/7.    Acute metabolic encephalopathy: Due to sepsis-has resolved-relatively awake and alert.    Hyperkalemia: resolved  Hypoglycemia: Resolved with supportive care.  Do not resume Amaryl on discharge.  DM-2 (A1c 7.6 on 4/15): CBG relatively stable-on tube feedings-hence on the higher side.  Continue 17 units of Semglee-and SSI.  Follow and adjust-avoid tight glycemic control due to risk of  hypoglycemia.  Recent Labs     05/06/22 0018 05/06/22 0443 05/06/22 0903  GLUCAP 186* 272* 225*     Hypothyroidism: Continue Cytomel.  PAF: Rate controlled-continue Xarelto  PAD: Follow-up with vascular surgery in the outpatient setting  HFpEF: Volume status stable-suspect has some amount of edema in lower extremities due to third spacing.  Demadex remains on hold.  Bipolar disorder: Given concerns for EPS-on Abilify and Cogentin-Depakote/Wellbutrin and Seroquel held.  Left shoulder pain: Supportive care-likely rotator cuff injury.  Stable to be followed up by orthopedics in the outpatient setting  Left arm swelling: Likely due to IVF infiltration-could have superficial venous thrombosis-left upper extremity Doppler negative for DVT.  Left arm swelling has resolved.  OSA: CPAP nightly  Acute urinary retention: Failed voiding trial on 6/5-continue Flomax-Foley catheter remains in place-we will plan on continuing Foley catheter to prevent soilage of sacral wound.  Debility/deconditioning: Has had worsening of his chronic debility during this hospitalization-plans are of SNF on discharge.  Per family-he has been nonambulatory since his discharge from [place] in April 2023.  This likely resulted in decubitus ulcer that the patient presented with.  Failure to thrive syndrome/nutrition Status: core track tube in place due to poor oral intake.  Continue NG tube feedings.  Encourage oral intake-when oral intake improves sufficiently-plan is to do a calorie count to see if we can discontinue the NG tube.  Nutrition Problem: Increased nutrient needs Etiology: chronic illness, wound healing Signs/Symptoms: estimated needs Interventions: Ensure Enlive (each supplement provides 350kcal and 20 grams of protein), Tube feeding, MVI  Pressure Ulcer: Pressure Injury 03/31/22 Sacrum Mid Unstageable - Full thickness tissue loss in which the base of the injury is covered by slough (yellow, tan, gray, green or brown) and/or  eschar (tan, brown or black) in the wound bed. (Active)  03/31/22 1933  Location: Sacrum  Location Orientation: Mid  Staging: Unstageable - Full thickness tissue loss in which the base of the injury is covered by slough (yellow, tan, gray, green or brown) and/or eschar (tan, brown or black) in the wound bed.  Wound Description (Comments):   Present on Admission: Yes  Dressing Type Foam - Lift dressing to assess site every shift 05/05/22 0800   Obesity: Estimated body mass index is 40.69 kg/m as calculated from the following:   Height as of this encounter: 6' (1.829 m).   Weight as of this encounter: 136.1 kg.   Code status:   Code Status: Full Code   DVT Prophylaxis: rivaroxaban (XARELTO) tablet 20 mg    Family Communication: None at bedside.  We will update daughter-[name]-[phone]-over the next few days.   Disposition Plan: Status is: Inpatient Remains inpatient appropriate because: Not stable for discharge-resolving sepsis physiology due to streptococcal bacteremia/infected sacral decubitus ulcer-continues to have low-grade fever-on IV daptomycin/Augmentin.  Hydrotherapy to be started on 5/19.   Planned Discharge Destination:Skilled nursing facility versus long-term acute care hospital.   Diet: Diet Order             Diet regular Room service appropriate? No; Fluid consistency: Thin  Diet effective now                     Antimicrobial agents: Anti-infectives (From admission, onward)    Start     Dose/Rate Route Frequency Ordered Stop   04/22/22 1015  linezolid (ZYVOX) tablet 600 mg        600 mg Oral Every 12 hours 04/22/22 0916 04/26/22 2110   04/15/22  0900  vancomycin (VANCOREADY) IVPB 1500 mg/300 mL  Status:  Discontinued        1,500 mg 150 mL/hr over 120 Minutes Intravenous Every 24 hours 04/14/22 0811 04/14/22 1005   04/14/22 2200  amoxicillin-clavulanate (AUGMENTIN) 875-125 MG per tablet 1 tablet        1 tablet Oral Every 12 hours  04/14/22 1331 04/23/22 2148   04/14/22 1100  DAPTOmycin (CUBICIN) 700 mg in sodium chloride 0.9 % IVPB  Status:  Discontinued        8 mg/kg  90.4 kg (Adjusted) 128 mL/hr over 30 Minutes Intravenous Daily 04/14/22 1005 04/22/22 0916   04/14/22 0900  vancomycin (VANCOREADY) IVPB 2000 mg/400 mL  Status:  Discontinued        2,000 mg 200 mL/hr over 120 Minutes Intravenous  Once 04/14/22 0811 04/14/22 1005   04/13/22 2200  metroNIDAZOLE (FLAGYL) tablet 500 mg  Status:  Discontinued        500 mg Oral Every 12 hours 04/13/22 1436 04/14/22 1331   04/12/22 0815  cefTRIAXone (ROCEPHIN) 2 g in sodium chloride 0.9 % 100 mL IVPB  Status:  Discontinued        2 g 200 mL/hr over 30 Minutes Intravenous Daily 04/12/22 0717 04/14/22 0811   04/12/22 0600  vancomycin (VANCOREADY) IVPB 1250 mg/250 mL  Status:  Discontinued        1,250 mg 166.7 mL/hr over 90 Minutes Intravenous Every 24 hours 04/11/22 0521 04/12/22 0717   04/11/22 1145  metroNIDAZOLE (FLAGYL) IVPB 500 mg  Status:  Discontinued        500 mg 100 mL/hr over 60 Minutes Intravenous Every 12 hours 04/11/22 1055 04/13/22 1436   04/11/22 0600  ceFEPIme (MAXIPIME) 2 g in sodium chloride 0.9 % 100 mL IVPB  Status:  Discontinued        2 g 200 mL/hr over 30 Minutes Intravenous Every 8 hours 04/11/22 0437 04/12/22 0717   04/11/22 0530  vancomycin (VANCOREADY) IVPB 2000 mg/400 mL        2,000 mg 200 mL/hr over 120 Minutes Intravenous  Once 04/11/22 0437 04/11/22 2008        MEDICATIONS: Scheduled Meds:  ARIPiprazole  10 mg Oral q morning   atorvastatin  10 mg Oral Daily   benztropine  1 mg Oral BID   Chlorhexidine Gluconate Cloth  6 each Topical Daily   diclofenac Sodium  2 g Topical QID   feeding supplement  237 mL Oral TID BM   insulin aspart  0-15 Units Subcutaneous Q4H   insulin glargine-yfgn  17 Units Subcutaneous Daily   liothyronine  10 mcg Oral Daily   mouth rinse  15 mL Mouth Rinse BID   metoprolol succinate  50 mg Oral Daily    multivitamin with minerals  1 tablet Oral Daily   nutrition supplement (JUVEN)  1 packet Oral BID BM   rivaroxaban  20 mg Oral Q supper   senna-docusate  2 tablet Oral BID   tamsulosin  0.4 mg Oral Daily   Continuous Infusions:  feeding supplement (JEVITY 1.5 CAL/FIBER) 1,000 mL (05/04/22 2000)   PRN Meds:.acetaminophen **OR** acetaminophen, bisacodyl, HYDROcodone-acetaminophen, melatonin, morphine injection   I have personally reviewed following labs and imaging studies  LABORATORY DATA: CBC: Recent Labs  Lab 04/30/22 0310 05/02/22 0200 05/05/22 0616  WBC 5.1 5.4 4.4  HGB 12.6* 12.9* 12.8*  HCT 39.2 39.9 39.9  MCV 85.4 85.8 86.2  PLT 310 291 244     Basic Metabolic  Panel: Recent Labs  Lab 04/30/22 0310 05/02/22 0200 05/03/22 0115 05/03/22 1900 05/04/22 0549 05/04/22 1535 05/05/22 0616  NA 133* 134* 131*  --   --   --  133*  K 4.3 4.8 4.9  --   --   --  4.8  CL 98 99 98  --   --   --  99  CO2 25 27 25   --   --   --  29  GLUCOSE 134* 238* 151*  --   --   --  295*  BUN 27* 38* 42*  --   --   --  38*  CREATININE 1.02 1.25* 1.04  --   --   --  0.98  CALCIUM 9.4 9.7 9.3  --   --   --  9.6  MG  --   --   --  2.0 2.1 2.0 2.0  PHOS  --   --   --  2.5 2.8 1.9* 2.2*     GFR: Estimated Creatinine Clearance: 103.1 mL/min (by C-G formula based on SCr of 0.98 mg/dL).  Liver Function Tests: No results for input(s): "AST", "ALT", "ALKPHOS", "BILITOT", "PROT", "ALBUMIN" in the last 168 hours.  No results for input(s): "LIPASE", "AMYLASE" in the last 168 hours. No results for input(s): "AMMONIA" in the last 168 hours.  Coagulation Profile: No results for input(s): "INR", "PROTIME" in the last 168 hours.  Cardiac Enzymes: No results for input(s): "CKTOTAL", "CKMB", "CKMBINDEX", "TROPONINI" in the last 168 hours.   BNP (last 3 results) No results for input(s): "PROBNP" in the last 8760 hours.  Lipid Profile: No results for input(s): "CHOL", "HDL", "LDLCALC",  "TRIG", "CHOLHDL", "LDLDIRECT" in the last 72 hours.  Thyroid Function Tests: No results for input(s): "TSH", "T4TOTAL", "FREET4", "T3FREE", "THYROIDAB" in the last 72 hours.  Anemia Panel: No results for input(s): "VITAMINB12", "FOLATE", "FERRITIN", "TIBC", "IRON", "RETICCTPCT" in the last 72 hours.  Urine analysis:    Component Value Date/Time   COLORURINE YELLOW 04/11/2022 0540   APPEARANCEUR HAZY (A) 04/11/2022 0540   APPEARANCEUR Cloudy 08/01/2014 1614   LABSPEC 1.017 04/11/2022 0540   LABSPEC 1.008 08/01/2014 1614   PHURINE 7.0 04/11/2022 0540   GLUCOSEU >=500 (A) 04/11/2022 0540   GLUCOSEU Negative 08/01/2014 1614   HGBUR NEGATIVE 04/11/2022 0540   BILIRUBINUR NEGATIVE 04/11/2022 0540   BILIRUBINUR Negative 08/01/2014 1614   KETONESUR NEGATIVE 04/11/2022 0540   PROTEINUR NEGATIVE 04/11/2022 0540   UROBILINOGEN 0.2 07/01/2014 1809   NITRITE NEGATIVE 04/11/2022 0540   LEUKOCYTESUR NEGATIVE 04/11/2022 0540   LEUKOCYTESUR Negative 08/01/2014 1614    Sepsis Labs: Lactic Acid, Venous    Component Value Date/Time   LATICACIDVEN 1.8 04/11/2022 0651    MICROBIOLOGY: No results found for this or any previous visit (from the past 240 hour(s)).   RADIOLOGY STUDIES/RESULTS: No results found.   LOS: 36 days   Oren Binet, MD  [facility]    To contact the attending provider between 7A-7P or the covering provider during after hours 7P-7A, please log into the web site [email or web address] and access using universal [place] password for that web site. If you do not have the password, please call the hospital operator.  05/06/2022, 11:43 AM

## 2022-05-06 NOTE — Progress Notes (Signed)
Physical Therapy Treatment Patient Details Name: Christian [name] MRN: [id number] DOB: 1951-09-27 Today's Date: 05/06/2022   History of Present Illness 71 y.o. male presented 5/2 with worsening strength , new decubitus ulcers and poor intake.  Recent admission for cellulitis and hypoglycemia, had just returned to ALF from SNF a week ago but not OOB since then.  Has new hypoglyemia, elevation of K+, creatinine, CK.  Ulcer is not infected but has LE skin is also irritated and has skin breakdown. LEFt UE swollen and Doppler ordered 5/15 to r/o DVT.  PMHx:  chronic venous stasis and chronic right shin unhealing wound, chronic HFpEF, IDDM, CAD with stenting x2 in 2021, PVD, CKD stage IIIa, HTN, DVT on Xarelto, bipolar disorder, OSA on CPAP at bedtime    PT Comments    Patient a little more stiff through torso and legs this date, making mobility more difficult. His standing (1/2 standing) in stedy required more external assist and he only held for ~5 seconds each time. Hopeful that with improved nutrition via cortrak that pt will begin to have increased strength and build stamina.     Recommendations for follow up therapy are one component of a multi-disciplinary discharge planning process, led by the attending physician.  Recommendations may be updated based on patient status, additional functional criteria and insurance authorization.  Follow Up Recommendations  PT at Long-term acute care hospital     Assistance Recommended at Discharge Frequent or constant Supervision/Assistance  Patient can return home with the following Two people to help with walking and/or transfers;Assistance with cooking/housework;Assist for transportation;Help with stairs or ramp for entrance;Two people to help with bathing/dressing/bathroom;Assistance with feeding;Direct supervision/assist for medications management;Direct supervision/assist for financial management   Equipment Recommendations  Other (comment);Wheelchair  (measurements PT);Wheelchair cushion (measurements PT) (hoyer lift)    Recommendations for Other Services       Precautions / Restrictions Precautions Precautions: Fall Precaution Comments: monitor vitals Restrictions Weight Bearing Restrictions: No     Mobility  Bed Mobility Overal bed mobility: Needs Assistance Bed Mobility: Rolling, Sidelying to Sit, Sit to Sidelying Rolling: Max assist (rt and left) Sidelying to sit: Max assist, +2 for physical assistance     Sit to sidelying: +2 for physical assistance, Max assist General bed mobility comments: pt following all instructions to assist with rolling, pushing up to sit and return to sidelying    Transfers Overall transfer level: Needs assistance Equipment used: Ambulation equipment used Transfers: Sit to/from Stand Sit to Stand: +2 physical assistance, +2 safety/equipment, From elevated surface, Max assist           General transfer comment: stood twice from EOB with assistance of 2 and bed pad under pelvis Transfer via Lift Equipment: Stedy  Ambulation/Gait               General Gait Details: unable   Stairs             Wheelchair Mobility    Modified Rankin (Stroke Patients Only)       Balance Overall balance assessment: Needs assistance, History of Falls Sitting-balance support: Feet supported, Bilateral upper extremity supported Sitting balance-Leahy Scale: Poor Sitting balance - Comments: pt with feet on stedy and holding onto rail of stedy with min assist due to rt lean Postural control: Right lateral lean Standing balance support: Bilateral upper extremity supported, Reliant on assistive device for balance Standing balance-Leahy Scale: Poor Standing balance comment: reliant upon BUE support  Cognition Arousal/Alertness: Awake/alert Behavior During Therapy: Flat affect Overall Cognitive Status: Impaired/Different from baseline Area of  Impairment: Problem solving, Attention, Following commands                   Current Attention Level: Sustained   Following Commands: Follows one step commands with increased time     Problem Solving: Slow processing, Decreased initiation, Difficulty sequencing, Requires verbal cues, Requires tactile cues          Exercises Other Exercises Other Exercises: worked on midline posture    General Comments General comments (skin integrity, edema, etc.): HR max 149 bpm after second standing attempt      Pertinent Vitals/Pain Pain Assessment Pain Assessment: 0-10 Pain Location: buttocks Pain Descriptors / Indicators: Grimacing, Guarding, Discomfort Pain Intervention(s): Limited activity within patient's tolerance, Monitored during session, Repositioned    Home Living Family/patient expects to be discharged to:: Skilled nursing facility Living Arrangements: Other (Comment) (congregate care) Available Help at Discharge: Available PRN/intermittently;Personal care attendant Type of Home: Assisted living Home Access: Level entry       Home Layout: One level Home Equipment: Rollator (4 wheels);Shower seat;Grab bars - toilet;Grab bars - tub/shower;Wheelchair - Education administrator (comment) (lift chair) Additional Comments: asking for a hosptial bed    Prior Function            PT Goals (current goals can now be found in the care plan section) Acute Rehab PT Goals Patient Stated Goal: not stated PT Goal Formulation: With patient Time For Goal Achievement: 05/12/22 Potential to Achieve Goals: Fair Progress towards PT goals: Progressing toward goals    Frequency    Min 2X/week      PT Plan Discharge plan needs to be updated    Co-evaluation PT/OT/SLP Co-Evaluation/Treatment: Yes Reason for Co-Treatment: Complexity of the patient's impairments (multi-system involvement);For patient/therapist safety;To address functional/ADL transfers PT goals addressed during session:  Mobility/safety with mobility;Balance;Strengthening/ROM        AM-PAC PT "6 Clicks" Mobility   Outcome Measure  Help needed turning from your back to your side while in a flat bed without using bedrails?: A Lot Help needed moving from lying on your back to sitting on the side of a flat bed without using bedrails?: Total Help needed moving to and from a bed to a chair (including a wheelchair)?: Total Help needed standing up from a chair using your arms (e.g., wheelchair or bedside chair)?: Total Help needed to walk in hospital room?: Total Help needed climbing 3-5 steps with a railing? : Total 6 Click Score: 7    End of Session   Activity Tolerance: Patient limited by fatigue Patient left: in bed;with call bell/phone within reach;Other (comment) (OT working on feeding goals) Nurse Communication: Mobility status (positioned on pts right side with prevalon boots in place; VAC is rolling up with part of wound uncovered) PT Visit Diagnosis: Unsteadiness on feet (R26.81);History of falling (Z91.81);Repeated falls (R29.6);Difficulty in walking, not elsewhere classified (R26.2)     Time: [id number] PT Time Calculation (min) (ACUTE ONLY): 36 min  Charges:  $Therapeutic Activity: 8-22 mins                      Arby Barrette, PT Acute Rehabilitation Services  Office [phone]    Rexanne Mano 05/06/2022, 1:11 PM

## 2022-05-06 NOTE — Progress Notes (Signed)
Palliative Medicine Progress Note   Patient Name: Christian [name]       Date: 05/06/2022 DOB: [birth year]  Age: 71 y.o. MRN#: [id number] Attending Physician: Maretta Bees, MD Primary Care Physician: [name], MD Admit Date: 03/30/2022   HPI/Patient Profile: 71 y.o. male  with past medical history of atrial fibrillation on Xarelto, HFpEF, PAD, DM type II, chronic kidney disease (baseline creatinine 1.6), and bipolar disorder.  He presented to the emergency department on 03/30/2022 with generalized weakness and sacral decubitus ulcer.  He was admitted to the hospitalist service with hypoglycemia and hyperkalemia.  There was initially concern of EPS; psych medications were adjusted with clinical improvement.  Hospital course has been complicated by development of fever due to probable strep bacteremia and infected decubitus ulcer.   5/2 - admit to [facility] for weakness/hyperglycemia/hyperkalemia/rigidity 5/14 - febrile/tachycardic with discharge from his sacral decubitus.  Started on broad-spectrum IV antibiotics.  Blood cultures came back positive for Streptococcus species. 5/16 - CT abdomen/pelvis showed inferior sacral decubitus ulceration with no evidence of osteomyelitis.  Echo showed EF 45 to 50%, no obvious vegetations. 5/19 -started hydrotherapy 6/5 - cortrak placed 6/7 - wound vac placed 6/14 - wound vac removed 6/19 - PEG placed in IR   Prior to March 2023, Christian [name] was living independently in a senior living apartment. He was hospitalized 01/26/2022 through 01/29/2022 with cellulitis secondary to diabetic ulcer of the ankle.  He was discharged to SNF for rehab.  He came home on 3/17 and family reports he was back to his baseline and ambulatory at this point.  He was then hospitalized at [facility] 03/11/2022 through 03/19/2022 with hypoglycemia and AKI.  He was discharged to [facility], but was not able to stay due to insurance so family brought him home.  Due to his poor mobility they were unable to care for him and brought him to the hospital.   Subjective: Patient is laying comfortably in bed. Alert and calm. He has no acute complaints. We discussed that he had been in the hospital now for 49 days. I asked Christian [name] if he felt he was progressing with PT, and he states "no". When I asked why he states "because I am not trying". When I asked Christian [name] what is his goal of care, he states "to walk  again". I reminded Christian [name] that he has not walked since April and will need to work hard with PT of he intends to meet this goal.   I spoke with his daughter Christian [name] by phone. The discussion centered around SNF placement. Christian [name] expresses that she has been very frustrated with this process. She has many concerns about the financial responsibility and references transfer of funds from payee. She wants to to ensure that the financial situation is clear She does not want to be back in the same situation as in April, when he had used all his allotted rehab days and family had to bring him home because they could not afford to pay for SNF. I let Christian [name] know that I would defer her concerns to [facility] team.   Christian [name] reports that they are keeping Christian [name]'s apartment for now. She indicates being hopeful that he may be able to return home at some point. Unfortunately, I think this is very unlikely.     Objective:  Physical Exam Vitals reviewed.  Constitutional:      General: He is not in acute distress.    Appearance: He is obese.     Comments: Chronically ill-appearing  Pulmonary:     Effort: Pulmonary effort is normal.  Neurological:     Mental Status: He is alert.     Motor: Weakness present.  Psychiatric:        Mood and Affect: Affect is flat.             Vital Signs: BP 110/75    Pulse (!) 101   Temp 97.7 F (36.5 C) (Oral)   Resp (!) 24   Ht 6' (1.829 m)   Wt 136.1 kg   SpO2 97%   BMI 40.69 kg/m  SpO2: SpO2: 97 % O2 Device: O2 Device: Room Air      Palliative Assessment/Data: PPS 30%     Palliative Medicine Assessment & Plan   Assessment: Principal Problem:   Hyperkalemia Active Problems:   Bacteremia   Diabetes mellitus type 2 in nonobese (HCC)   Sacral decubitus ulcer, stage II (HCC)   Severe sepsis without septic shock (HCC)   Hypoglycemia    Recommendations/Plan: Full code full scope SNF placement in progress PMT will continue to follow peripherally   Prognosis:  Difficult to determine, but remains high rigk for complications in the setting of multiple chronic conditions with  poor mobility, wounds, and malnutrition   Thank you for allowing the Palliative Medicine Team to assist in the care of this patient.   MDM - moderate   Merry Proud, NP   Please contact Palliative Medicine Team phone at [phone] for questions and concerns.  For individual providers, please see AMION.

## 2022-05-07 DIAGNOSIS — L89152 Pressure ulcer of sacral region, stage 2: Secondary | ICD-10-CM | POA: Diagnosis not present

## 2022-05-07 DIAGNOSIS — R748 Abnormal levels of other serum enzymes: Secondary | ICD-10-CM | POA: Diagnosis not present

## 2022-05-07 DIAGNOSIS — R7881 Bacteremia: Secondary | ICD-10-CM | POA: Diagnosis not present

## 2022-05-07 DIAGNOSIS — R627 Adult failure to thrive: Secondary | ICD-10-CM | POA: Diagnosis not present

## 2022-05-07 LAB — GLUCOSE, CAPILLARY
Glucose-Capillary: 229 mg/dL — ABNORMAL HIGH (ref 70–99)
Glucose-Capillary: 254 mg/dL — ABNORMAL HIGH (ref 70–99)
Glucose-Capillary: 265 mg/dL — ABNORMAL HIGH (ref 70–99)
Glucose-Capillary: 269 mg/dL — ABNORMAL HIGH (ref 70–99)
Glucose-Capillary: 276 mg/dL — ABNORMAL HIGH (ref 70–99)
Glucose-Capillary: 307 mg/dL — ABNORMAL HIGH (ref 70–99)
Glucose-Capillary: 352 mg/dL — ABNORMAL HIGH (ref 70–99)

## 2022-05-07 MED ORDER — ENSURE ENLIVE PO LIQD
237.0000 mL | Freq: Two times a day (BID) | ORAL | Status: DC
  Administered 2022-05-07 – 2022-05-20 (×24): 237 mL via ORAL

## 2022-05-07 MED ORDER — INSULIN GLARGINE-YFGN 100 UNIT/ML ~~LOC~~ SOLN
20.0000 [IU] | Freq: Every day | SUBCUTANEOUS | Status: DC
  Administered 2022-05-07: 20 [IU] via SUBCUTANEOUS
  Filled 2022-05-07 (×2): qty 0.2

## 2022-05-07 NOTE — Consult Note (Addendum)
WOC Nurse Consult Note: Reason for Consult: Vac change performed to Stage 4 pressure injury to sacrum.  Pt has a large amt semiformed brown stool; it will be difficult to keep the Vac intact with a good seal if this continues since it is located in close proximity to the rectum. Cleaned patient and dressing changed.  Pressure Injury POA: Yes Refer to previous measurements on Wed: 9 cm x 9 cm x 3 cm with tunneling towards coccyx that extends 8.5 cm.  Wound bed: 90% red, 10% yellow slough near the bottom wound edge Drainage (amount, consistency, odor) moderate serosanguinous drainage  Periwound: Applied 2 barrier rings to wound edges to attempt to maintain a seal. Dressing procedure/placement/frequency: Pt is on a low airloss mattress to decrease pressure and was medicated for pain prior to the procedure and tolerated with mod amt discomfort. Applied 1 piece black foam to cont suction, bridged trackpad to right trochanter to offload pressure. Covered with drape and seal is immediately achieved.  WOC team will plan to perform dressing change again on Mon. Cammie Mcgee MSN, RN, CWOCN, [place], CNS [phone]

## 2022-05-07 NOTE — TOC Progression Note (Signed)
Transition of Care [facility]) - Progression Note    Patient Details  Name: GUNNER IODICE MRN: [id number] Date of Birth: 18-Mar-1951  Transition of Care [facility]) CM/SW Contact  Mearl Latin, LCSW Phone Number: 05/07/2022, 2:49 PM  Clinical Narrative:    Select still awaiting answer from appeal.   Expected Discharge Plan: Skilled Nursing Facility Barriers to Discharge: Insurance Authorization  Expected Discharge Plan and Services Expected Discharge Plan: Skilled Nursing Facility In-house Referral: Clinical Social Work   Post Acute Care Choice: Skilled Nursing Facility Living arrangements for the past 2 months: Single Family Home, Skilled Nursing Facility                                       Social Determinants of Health (SDOH) Interventions    Readmission Risk Interventions    04/13/2022    3:06 PM 04/13/2022   10:43 AM 01/28/2022   10:17 AM  Readmission Risk Prevention Plan  Transportation Screening  Complete Complete  PCP or Specialist Appt within 5-7 Days   Complete  PCP or Specialist Appt within 3-5 Days Complete    Home Care Screening   Complete  Medication Review (RN CM)   Complete  HRI or Home Care Consult Complete    Social Work Consult for Recovery Care Planning/Counseling Complete    Palliative Care Screening Not Applicable    Medication Review Oceanographer) Referral to Pharmacy Complete   PCP or Specialist appointment within 3-5 days of discharge  Complete   HRI or Home Care Consult  Complete   SW Recovery Care/Counseling Consult  Complete   Palliative Care Screening  Not Applicable   Skilled Nursing Facility  Complete

## 2022-05-07 NOTE — Progress Notes (Signed)
Nutrition Follow-up  DOCUMENTATION CODES:   Not applicable  INTERVENTION:   Continue Multivitamin w/ minerals daily Decrease Ensure Enlive to po BID, each supplement provides 350 kcal and 20 grams of protein. Continue 1 packet Juven BID, each packet provides 95 calories, 2.5 grams of protein to support wound healing Feeding assist with all meals Encourage good PO intake  Continue Tube feeds via Cortrak:  Jevity 1.5 @ 60 mL/hr (1440 mL/hr) Provides 2160 kcal, 92 gm protein, and 1094 mL of free water daily.  NUTRITION DIAGNOSIS:   Increased nutrient needs related to chronic illness, wound healing as evidenced by estimated needs. - Ongoing  GOAL:   Patient will meet greater than or equal to 90% of their needs - Ongoing  MONITOR:   PO intake, Supplement acceptance, Labs, TF tolerance, Skin  REASON FOR ASSESSMENT:   Consult Enteral/tube feeding initiation and management  ASSESSMENT:   71 y.o. male presented to the ED with generalized weakness. Pt recently discharged from rehab after hospital admission due to hypoglycemia cellulitis. PMH includes CHF, CKD III, HTN, and T2DM. Pt admitted with hypoglycemia, hyperkalemia, and generalized weakness.   5/11 - diet downgraded to Dysphagia 1, Nectar Thick Liquids 5/15 - NPO 5/16 - diet advanced to Dysphagia 1, Nectar Thick Liquids 5/18 - diet advanced to thin liquids 5/19 - Hydrotherapy started 5/25 - diet advanced to Dysphagia 2 6/02 - diet advanced to regular 6/05 - Cortrak placed (tip gastric) 6/07 - wound VAC applied to sacrum  Pt resting in bed, eyes closed. Woke to RD voice.   Pt reports that he has been tolerating the TF, just that the tube is in his way. Reports that breakfast is the first time he has ate in a few days. Reports that it went well though. He states that he had not been asking for Ensure, RD encouraged pt to continue to eat and drink his Ensure's.  Pt CBG noted to be elevated since initiation of TF. Per DM  coordinator's note yesterday, recommend initiating Novolog q4H.   Suspect that weight that was put into the system on 05/06/22 is incorrect due to weight gain of 80# in 3 days.   Micronutrient Labs Vitamin A: 36.4 (WNL) Vitamin C: 2.5 (High) Zinc: 82 (WNL)  Medications reviewed and include: SSI 0-15 units q4h, Semglee, MVI, Senokot Labs reviewed: 24 hr CBG [id number]  Diet Order:   Diet Order             Diet regular Room service appropriate? No; Fluid consistency: Thin  Diet effective now                  EDUCATION NEEDS:   No education needs have been identified at this time  Skin:  Skin Assessment: Skin Integrity Issues: Skin Integrity Issues:: Unstageable Stage III: Sacrum Unstageable: Coccyx & Buttocks  Last BM:  6/8  Height:  Ht Readings from Last 1 Encounters:  04/17/22 6' (1.829 m)   Weight:  Wt Readings from Last 1 Encounters:  05/06/22 136.1 kg   Ideal Body Weight:  80.9 kg  BMI:  Body mass index is 40.69 kg/m.  Estimated Nutritional Needs:  Kcal:  2100-2300 Protein:  105-120 grams Fluid:  >/= 2.1 L   Kirby Crigler RD, LDN Clinical Dietitian See [facility] for contact information.

## 2022-05-07 NOTE — Progress Notes (Signed)
PROGRESS NOTE        PATIENT DETAILS Name: Christian [name] Age: 71 y.o. Sex: male Date of Birth: 08/30/1951 Admit Date: 03/30/2022 Admitting Physician Rise Patience, MD PCP:Sun, Gari Crown, MD  Brief Summary: Patient is a 71 y.o.  male with a history of A-fib on Xarelto, HFpEF, PAD, DM-2, bipolar disorder-presented with generalized weakness/sacral decubitus ulcer-was found to have hypoglycemia, hyperkalemia and subsequently admitted to the hospitalist service-see below for further details.  Thought to have extrapyramidal symptoms-evaluated by psych/neurology-psych medications adjusted with clinical improvement.  Unfortunately-further hospital course complicated by development of fever-due to probable Streptococcus bacteremia and infected decubitus ulcer.  See below for further details.  Significant events: 5/2>> admit to [facility] for weakness/hypoglycemia/hyperkalemia/rigidity-thought to have EPS 5/14>> febrile/tachycardic-Per nursing staff-discharge from prior existing sacral decubitus ulcer.  Started on broad-spectrum IV antibiotics.  Prelim blood culture positive for Streptococcus species. 5/19>> wound care starting hydrotherapy 6/05>>Cortak tube placed 6/07>>wound vac placed   Significant studies: 5/3>> x-ray pelvis: No fracture. 5/3>> CT head: No acute intracranial findings. 5/3>> x-ray left shoulder: Rotator cuff impingement 5/14>> CXR:No Obvious PNA 5/15>> left upper extremity Doppler: No DVT. 5/16>> CT abdomen/pelvis: Inferior sacral decubitus ulceration-no evidence of osteomyelitis. 5/16>> Echo: EF 45-50%, no obvious vegetations.  Significant microbiology data: 5/14>> blood culture: Streptococcus constellatus  5/15>> blood culture: No growth  Procedures:   Consults: Neurology, psychiatry, infectious disease,CCS  Subjective: Awake/alert-chronically frail appearing.  No major events overnight.Awaiting LTACH decision from his insurance  company.  Objective: Vitals: Blood pressure 105/83, pulse (!) 102, temperature 97.9 F (36.6 C), temperature source Oral, resp. rate 18, height 6' (1.829 m), weight 136.1 kg, SpO2 98 %.   Exam: Gen Exam:Alert awake-not in any distress HEENT:atraumatic, normocephalic Chest: B/L clear to auscultation anteriorly CVS:S1S2 regular Abdomen:soft non tender, non distended Extremities:no edema Neurology: Generalized weakness-but nonfocal exam. Skin: no rash   Pertinent Labs/Radiology:    Latest Ref Rng & Units 05/05/2022    6:16 AM 05/02/2022    2:00 AM 04/30/2022    3:10 AM  CBC  WBC 4.0 - 10.5 K/uL 4.4  5.4  5.1   Hemoglobin 13.0 - 17.0 g/dL 12.8  12.9  12.6   Hematocrit 39.0 - 52.0 % 39.9  39.9  39.2   Platelets 150 - 400 K/uL 244  291  310     Lab Results  Component Value Date   NA 133 (L) 05/05/2022   K 4.8 05/05/2022   CL 99 05/05/2022   CO2 29 05/05/2022      Assessment/Plan: Rigidity/tremors/mildly elevated CK: Felt to be more of extrapyramidal syndrome rather than NMS at this point.  Managed with supportive care.  After neurology/psych evaluation-has been resumed on Abilify and Cogentin-Wellbutrin/Seroquel has been discontinued.  AKI on CKD stage IIIa: AKI likely hemodynamically mediated-improved-creatinine close to baseline.   Mild rhabdomyolysis: Nontraumatic-improved with supportive care.  Sepsis due to possible strep bacteremia-sacral decubitus ulcer with sepsis: Infection: Sepsis physiology has resolved-has completed a course of antimicrobial therapy-completed hydrotherapy-wound care placed wound VAC on 6/7.    Acute metabolic encephalopathy: Due to sepsis-has resolved-relatively awake and alert.    Hyperkalemia: resolved  Hypoglycemia: Resolved with supportive care.  Do not resume Amaryl on discharge.  DM-2 (A1c 7.6 on 4/15): CBG remains on the higher side-increase Semglee to 20 units-continue SSI and follow accordingly.  avoid tight glycemic control due to risk  of hypoglycemia.  Recent Labs    05/07/22 0422 05/07/22 0827 05/07/22 1240  GLUCAP 229* 269* 307*     Hypothyroidism: Continue Cytomel.  PAF: Rate controlled-continue Xarelto  PAD: Follow-up with vascular surgery in the outpatient setting  HFpEF: Volume status stable-suspect has some amount of edema in lower extremities due to third spacing.  Demadex remains on hold.  Bipolar disorder: Given concerns for EPS-on Abilify and Cogentin-Depakote/Wellbutrin and Seroquel held.  Left shoulder pain: Supportive care-likely rotator cuff injury.  Stable to be followed up by orthopedics in the outpatient setting  Left arm swelling: Likely due to IVF infiltration-could have superficial venous thrombosis-left upper extremity Doppler negative for DVT.  Left arm swelling has resolved.  OSA: CPAP nightly  Acute urinary retention: Failed voiding trial on 6/5-continue Flomax-Foley catheter remains in place-we will plan on continuing Foley catheter to prevent soilage of sacral wound.  Debility/deconditioning: Has had worsening of his chronic debility during this hospitalization-plans are of SNF on discharge.  Per family-he has been nonambulatory since his discharge from [place] in April 2023.  This likely resulted in decubitus ulcer that the patient presented with.  Failure to thrive syndrome/nutrition Status: core track tube in place due to poor oral intake.  Continue NG tube feedings.  Encourage oral intake-when oral intake improves sufficiently-plan is to do a calorie count to see if we can discontinue the NG tube.  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 (Active)  03/31/22 1933   Location: Sacrum  Location Orientation: Mid  Staging: Stage 4 - Full thickness tissue loss with exposed bone, tendon or muscle.  Wound Description (Comments):   Present on Admission: Yes  Dressing Type Foam - Lift dressing to assess site every shift 05/05/22 0800   Obesity: Estimated body mass index is 40.69 kg/m as calculated from the following:   Height as of this encounter: 6' (1.829 m).   Weight as of this encounter: 136.1 kg.   Code status:   Code Status: Full Code   DVT Prophylaxis: rivaroxaban (XARELTO) tablet 20 mg    Family Communication: [name]-[phone]-updated 6/9  Disposition Plan: Status is: Inpatient Remains inpatient appropriate because: Medically stable to be discharged to [facility]has NG tube feeding in place-wound VAC.  Awaiting word from insurance company whether or not he will be approved for LTAC.   Planned Discharge Destination:Skilled nursing facility versus long-term acute care hospital.   Diet: Diet Order             Diet regular Room service appropriate? No; Fluid consistency: Thin  Diet effective now                     Antimicrobial agents: Anti-infectives (From admission, onward)    Start     Dose/Rate Route Frequency Ordered Stop   04/22/22 1015  linezolid (ZYVOX) tablet 600 mg        600 mg Oral Every 12 hours 04/22/22 0916 04/26/22 2110   04/15/22 0900  vancomycin (VANCOREADY) IVPB 1500 mg/300 mL  Status:  Discontinued        1,500 mg 150 mL/hr over 120 Minutes Intravenous Every 24 hours 04/14/22 0811 04/14/22 1005   04/14/22 2200  amoxicillin-clavulanate (AUGMENTIN) 875-125 MG per tablet 1 tablet  1 tablet Oral Every 12 hours 04/14/22 1331 04/23/22 2148   04/14/22 1100  DAPTOmycin (CUBICIN) 700 mg in sodium chloride 0.9 % IVPB  Status:  Discontinued        8 mg/kg  90.4 kg (Adjusted) 128 mL/hr over 30 Minutes Intravenous Daily 04/14/22 1005 04/22/22 0916   04/14/22 0900  vancomycin (VANCOREADY) IVPB 2000  mg/400 mL  Status:  Discontinued        2,000 mg 200 mL/hr over 120 Minutes Intravenous  Once 04/14/22 0811 04/14/22 1005   04/13/22 2200  metroNIDAZOLE (FLAGYL) tablet 500 mg  Status:  Discontinued        500 mg Oral Every 12 hours 04/13/22 1436 04/14/22 1331   04/12/22 0815  cefTRIAXone (ROCEPHIN) 2 g in sodium chloride 0.9 % 100 mL IVPB  Status:  Discontinued        2 g 200 mL/hr over 30 Minutes Intravenous Daily 04/12/22 0717 04/14/22 0811   04/12/22 0600  vancomycin (VANCOREADY) IVPB 1250 mg/250 mL  Status:  Discontinued        1,250 mg 166.7 mL/hr over 90 Minutes Intravenous Every 24 hours 04/11/22 0521 04/12/22 0717   04/11/22 1145  metroNIDAZOLE (FLAGYL) IVPB 500 mg  Status:  Discontinued        500 mg 100 mL/hr over 60 Minutes Intravenous Every 12 hours 04/11/22 1055 04/13/22 1436   04/11/22 0600  ceFEPIme (MAXIPIME) 2 g in sodium chloride 0.9 % 100 mL IVPB  Status:  Discontinued        2 g 200 mL/hr over 30 Minutes Intravenous Every 8 hours 04/11/22 0437 04/12/22 0717   04/11/22 0530  vancomycin (VANCOREADY) IVPB 2000 mg/400 mL        2,000 mg 200 mL/hr over 120 Minutes Intravenous  Once 04/11/22 0437 04/11/22 2008        MEDICATIONS: Scheduled Meds:  ARIPiprazole  10 mg Oral q morning   atorvastatin  10 mg Oral Daily   benztropine  1 mg Oral BID   Chlorhexidine Gluconate Cloth  6 each Topical Daily   diclofenac Sodium  2 g Topical QID   feeding supplement  237 mL Oral BID BM   insulin aspart  0-15 Units Subcutaneous Q4H   insulin glargine-yfgn  20 Units Subcutaneous Daily   liothyronine  10 mcg Oral Daily   mouth rinse  15 mL Mouth Rinse BID   metoprolol succinate  50 mg Oral Daily   multivitamin with minerals  1 tablet Oral Daily   nutrition supplement (JUVEN)  1 packet Oral BID BM   rivaroxaban  20 mg Oral Q supper   senna-docusate  2 tablet Oral BID   tamsulosin  0.4 mg Oral Daily   Continuous Infusions:  feeding supplement (JEVITY 1.5 CAL/FIBER) 1,000 mL  (05/04/22 2000)   PRN Meds:.acetaminophen **OR** acetaminophen, bisacodyl, HYDROcodone-acetaminophen, melatonin, morphine injection   I have personally reviewed following labs and imaging studies  LABORATORY DATA: CBC: Recent Labs  Lab 05/02/22 0200 05/05/22 0616  WBC 5.4 4.4  HGB 12.9* 12.8*  HCT 39.9 39.9  MCV 85.8 86.2  PLT 291 244     Basic Metabolic Panel: Recent Labs  Lab 05/02/22 0200 05/03/22 0115 05/03/22 1900 05/04/22 0549 05/04/22 1535 05/05/22 0616  NA 134* 131*  --   --   --  133*  K 4.8 4.9  --   --   --  4.8  CL 99 98  --   --   --  99  CO2  27 25  --   --   --  29  GLUCOSE 238* 151*  --   --   --  295*  BUN 38* 42*  --   --   --  38*  CREATININE 1.25* 1.04  --   --   --  0.98  CALCIUM 9.7 9.3  --   --   --  9.6  MG  --   --  2.0 2.1 2.0 2.0  PHOS  --   --  2.5 2.8 1.9* 2.2*     GFR: Estimated Creatinine Clearance: 103.1 mL/min (by C-G formula based on SCr of 0.98 mg/dL).  Liver Function Tests: No results for input(s): "AST", "ALT", "ALKPHOS", "BILITOT", "PROT", "ALBUMIN" in the last 168 hours.  No results for input(s): "LIPASE", "AMYLASE" in the last 168 hours. No results for input(s): "AMMONIA" in the last 168 hours.  Coagulation Profile: No results for input(s): "INR", "PROTIME" in the last 168 hours.  Cardiac Enzymes: No results for input(s): "CKTOTAL", "CKMB", "CKMBINDEX", "TROPONINI" in the last 168 hours.   BNP (last 3 results) No results for input(s): "PROBNP" in the last 8760 hours.  Lipid Profile: No results for input(s): "CHOL", "HDL", "LDLCALC", "TRIG", "CHOLHDL", "LDLDIRECT" in the last 72 hours.  Thyroid Function Tests: No results for input(s): "TSH", "T4TOTAL", "FREET4", "T3FREE", "THYROIDAB" in the last 72 hours.  Anemia Panel: No results for input(s): "VITAMINB12", "FOLATE", "FERRITIN", "TIBC", "IRON", "RETICCTPCT" in the last 72 hours.  Urine analysis:    Component Value Date/Time   COLORURINE YELLOW 04/11/2022  0540   APPEARANCEUR HAZY (A) 04/11/2022 0540   APPEARANCEUR Cloudy 08/01/2014 1614   LABSPEC 1.017 04/11/2022 0540   LABSPEC 1.008 08/01/2014 1614   PHURINE 7.0 04/11/2022 0540   GLUCOSEU >=500 (A) 04/11/2022 0540   GLUCOSEU Negative 08/01/2014 1614   HGBUR NEGATIVE 04/11/2022 0540   BILIRUBINUR NEGATIVE 04/11/2022 0540   BILIRUBINUR Negative 08/01/2014 1614   KETONESUR NEGATIVE 04/11/2022 0540   PROTEINUR NEGATIVE 04/11/2022 0540   UROBILINOGEN 0.2 07/01/2014 1809   NITRITE NEGATIVE 04/11/2022 0540   LEUKOCYTESUR NEGATIVE 04/11/2022 0540   LEUKOCYTESUR Negative 08/01/2014 1614    Sepsis Labs: Lactic Acid, Venous    Component Value Date/Time   LATICACIDVEN 1.8 04/11/2022 0651    MICROBIOLOGY: No results found for this or any previous visit (from the past 240 hour(s)).   RADIOLOGY STUDIES/RESULTS: No results found.   LOS: 9 days   Oren Binet, MD  [facility]    To contact the attending provider between 7A-7P or the covering provider during after hours 7P-7A, please log into the web site [email or web address] and access using universal [place] password for that web site. If you do not have the password, please call the hospital operator.  05/07/2022, 1:32 PM

## 2022-05-08 DIAGNOSIS — E875 Hyperkalemia: Secondary | ICD-10-CM | POA: Diagnosis not present

## 2022-05-08 LAB — BASIC METABOLIC PANEL
Anion gap: 6 (ref 5–15)
BUN: 29 mg/dL — ABNORMAL HIGH (ref 8–23)
CO2: 25 mmol/L (ref 22–32)
Calcium: 9.4 mg/dL (ref 8.9–10.3)
Chloride: 99 mmol/L (ref 98–111)
Creatinine, Ser: 0.87 mg/dL (ref 0.61–1.24)
GFR, Estimated: >60 mL/min (ref >60)
Glucose, Bld: 341 mg/dL — ABNORMAL HIGH (ref 70–99)
Potassium: 4.4 mmol/L (ref 3.5–5.1)
Sodium: 130 mmol/L — ABNORMAL LOW (ref 135–145)

## 2022-05-08 LAB — GLUCOSE, CAPILLARY
Glucose-Capillary: 287 mg/dL — ABNORMAL HIGH (ref 70–99)
Glucose-Capillary: 293 mg/dL — ABNORMAL HIGH (ref 70–99)
Glucose-Capillary: 258 mg/dL — ABNORMAL HIGH (ref 70–99)
Glucose-Capillary: 226 mg/dL — ABNORMAL HIGH (ref 70–99)
Glucose-Capillary: 183 mg/dL — ABNORMAL HIGH (ref 70–99)

## 2022-05-08 LAB — PHOSPHORUS: Phosphorus: 2.3 mg/dL — ABNORMAL LOW (ref 2.5–4.6)

## 2022-05-08 MED ORDER — INSULIN GLARGINE-YFGN 100 UNIT/ML ~~LOC~~ SOLN
26.0000 [IU] | Freq: Every day | SUBCUTANEOUS | Status: DC
Start: 2022-05-08 — End: 2022-05-09
  Administered 2022-05-08 – 2022-05-09 (×2): 26 [IU] via SUBCUTANEOUS
  Filled 2022-05-08 (×2): qty 0.26

## 2022-05-08 NOTE — Progress Notes (Addendum)
PROGRESS NOTE        PATIENT DETAILS Name: Christian [name] Risk Age: 71 y.o. Sex: male Date of Birth: [birth year] Admit Date: 03/30/2022 Admitting Physician Rise Patience, MD PCP:Sun, Gari Crown, MD  Brief Summary: Patient is a 71 y.o.  male with a history of A-fib on Xarelto, HFpEF, PAD, DM-2, bipolar disorder-presented with generalized weakness/sacral decubitus ulcer-was found to have hypoglycemia, hyperkalemia and subsequently admitted to the hospitalist service-see below for further details.  Thought to have extrapyramidal symptoms-evaluated by psych/neurology-psych medications adjusted with clinical improvement.  Unfortunately-further hospital course complicated by development of fever-due to probable Streptococcus bacteremia and infected decubitus ulcer.  See below for further details.  Significant events: 5/2>> admit to [facility] for weakness/hypoglycemia/hyperkalemia/rigidity-thought to have EPS 5/14>> febrile/tachycardic-Per nursing staff-discharge from prior existing sacral decubitus ulcer.  Started on broad-spectrum IV antibiotics.  Prelim blood culture positive for Streptococcus species. 5/19>> wound care starting hydrotherapy 6/05>>Cortak tube placed 6/07>>wound vac placed   Significant studies: 5/3>> x-ray pelvis: No fracture. 5/3>> CT head: No acute intracranial findings. 5/3>> x-ray left shoulder: Rotator cuff impingement 5/14>> CXR:No Obvious PNA 5/15>> left upper extremity Doppler: No DVT. 5/16>> CT abdomen/pelvis: Inferior sacral decubitus ulceration-no evidence of osteomyelitis. 5/16>> Echo: EF 45-50%, no obvious vegetations.  Significant microbiology data: 5/14>> blood culture: Streptococcus constellatus  5/15>> blood culture: No growth  Procedures:   Consults: Neurology, psychiatry, infectious disease,CCS  Subjective: Lying comfortably in bed-no major issues overnight.  Is awake and alert.  Awaiting insurance authorization to see whether  he can go to [facility].  Objective: Vitals: Blood pressure 104/83, pulse (!) 103, temperature 98.6 F (37 C), temperature source Oral, resp. rate 18, height 6' (1.829 m), weight 136.1 kg, SpO2 95 %.   Exam: Gen Exam:Alert awake-not in any distress HEENT:atraumatic, normocephalic Chest: B/L clear to auscultation anteriorly CVS:S1S2 regular Abdomen:soft non tender, non distended Extremities:no edema Neurology: Generalized weakness-but nonfocal exam. Skin: no rash   Pertinent Labs/Radiology:    Latest Ref Rng & Units 05/05/2022    6:16 AM 05/02/2022    2:00 AM 04/30/2022    3:10 AM  CBC  WBC 4.0 - 10.5 K/uL 4.4  5.4  5.1   Hemoglobin 13.0 - 17.0 g/dL 12.8  12.9  12.6   Hematocrit 39.0 - 52.0 % 39.9  39.9  39.2   Platelets 150 - 400 K/uL 244  291  310     Lab Results  Component Value Date   NA 130 (L) 05/08/2022   K 4.4 05/08/2022   CL 99 05/08/2022   CO2 25 05/08/2022      Assessment/Plan: Rigidity/tremors/mildly elevated CK: Felt to be more of extrapyramidal syndrome rather than NMS at this point.  Managed with supportive care.  After neurology/psych evaluation-has been resumed on Abilify and Cogentin-Wellbutrin/Seroquel has been discontinued.  AKI on CKD stage IIIa: AKI likely hemodynamically mediated-improved-creatinine close to baseline.   Mild rhabdomyolysis: Nontraumatic-improved with supportive care.  Sepsis due to possible strep bacteremia-sacral decubitus ulcer with abscess :Sepsis physiology has resolved-has completed a course of antimicrobial therapy-completed hydrotherapy-wound care placed wound VAC on 6/7.    Acute metabolic encephalopathy: Due to sepsis-has resolved-relatively awake and alert.    Hyperkalemia: resolved  Hypoglycemia: Resolved with supportive care.  Do not resume Amaryl on discharge.  DM-2 (A1c 7.6 on 4/15): CBG remains on the higher side-increase Semglee to 26 units-continue SSI and follow  accordingly.  Avoid tight glycemic control due to risk of  hypoglycemia.  Recent Labs    05/08/22 0443 05/08/22 0748 05/08/22 1204  GLUCAP 287* 293* 258*     Hypothyroidism: Continue Cytomel.  PAF: Rate controlled-continue Xarelto  PAD: Follow-up with vascular surgery in the outpatient setting  HFpEF: Volume status stable-suspect has some amount of edema in lower extremities due to third spacing.  Demadex remains on hold.  Bipolar disorder: Given concerns for EPS-on Abilify and Cogentin-Depakote/Wellbutrin and Seroquel held.  Left shoulder pain: Supportive care-likely rotator cuff injury.  Stable to be followed up by orthopedics in the outpatient setting  Left arm swelling: Likely due to IVF infiltration-could have superficial venous thrombosis-left upper extremity Doppler negative for DVT.  Left arm swelling has resolved.  OSA: CPAP nightly  Acute urinary retention: Failed voiding trial on 6/5-continue Flomax-Foley catheter remains in place-we will plan on continuing Foley catheter to prevent soilage of sacral wound.  Debility/deconditioning: Has had worsening of his chronic debility during this hospitalization-plans are of SNF on discharge.  Per family-he has been nonambulatory since his discharge from [facility] in April 2023.  This likely resulted in decubitus ulcer that the patient presented with.  Failure to thrive syndrome/nutrition Status: core track tube in place due to poor oral intake.  Continue NG tube feedings.  Encourage oral intake-when oral intake improves sufficiently-plan is to do a calorie count to see if we can discontinue the NG tube.  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 (Active)  03/31/22 1933   Location: Sacrum  Location Orientation: Mid  Staging: Stage 4 - Full thickness tissue loss with exposed bone, tendon or muscle.  Wound Description (Comments):   Present on Admission: Yes  Dressing Type Foam - Lift dressing to assess site every shift 05/05/22 0800   Obesity: Estimated body mass index is 40.69 kg/m as calculated from the following:   Height as of this encounter: 6' (1.829 m).   Weight as of this encounter: 136.1 kg.   Code status:   Code Status: Full Code   DVT Prophylaxis: rivaroxaban (XARELTO) tablet 20 mg    Family Communication: [name]-[phone]-updated 6/9  Disposition Plan: Status is: Inpatient Remains inpatient appropriate because: Medically stable to be discharged to [facility].-has NG tube feeding in place-wound VAC.  Awaiting word from insurance company whether or not he will be approved for LTAC.   Planned Discharge Destination:Skilled nursing facility versus long-term acute care hospital.   Diet: Diet Order             Diet regular Room service appropriate? No; Fluid consistency: Thin  Diet effective now                     Antimicrobial agents: Anti-infectives (From admission, onward)    Start     Dose/Rate Route Frequency Ordered Stop   04/22/22 1015  linezolid (ZYVOX) tablet 600 mg        600 mg Oral Every 12 hours 04/22/22 0916 04/26/22 2110   04/15/22 0900  vancomycin (VANCOREADY) IVPB 1500 mg/300 mL  Status:  Discontinued        1,500 mg 150 mL/hr over 120 Minutes Intravenous Every 24 hours 04/14/22 0811 04/14/22 1005   04/14/22 2200  amoxicillin-clavulanate (AUGMENTIN) 875-125 MG per tablet 1 tablet        1 tablet Oral Every 12 hours 04/14/22 1331 04/23/22 2148   04/14/22 1100  DAPTOmycin (CUBICIN) 700 mg in sodium chloride 0.9 % IVPB  Status:  Discontinued        8 mg/kg  90.4 kg (Adjusted) 128 mL/hr over 30 Minutes Intravenous Daily 04/14/22 1005 04/22/22 0916   04/14/22 0900  vancomycin (VANCOREADY) IVPB 2000  mg/400 mL  Status:  Discontinued        2,000 mg 200 mL/hr over 120 Minutes Intravenous  Once 04/14/22 0811 04/14/22 1005   04/13/22 2200  metroNIDAZOLE (FLAGYL) tablet 500 mg  Status:  Discontinued        500 mg Oral Every 12 hours 04/13/22 1436 04/14/22 1331   04/12/22 0815  cefTRIAXone (ROCEPHIN) 2 g in sodium chloride 0.9 % 100 mL IVPB  Status:  Discontinued        2 g 200 mL/hr over 30 Minutes Intravenous Daily 04/12/22 0717 04/14/22 0811   04/12/22 0600  vancomycin (VANCOREADY) IVPB 1250 mg/250 mL  Status:  Discontinued        1,250 mg 166.7 mL/hr over 90 Minutes Intravenous Every 24 hours 04/11/22 0521 04/12/22 0717   04/11/22 1145  metroNIDAZOLE (FLAGYL) IVPB 500 mg  Status:  Discontinued        500 mg 100 mL/hr over 60 Minutes Intravenous Every 12 hours 04/11/22 1055 04/13/22 1436   04/11/22 0600  ceFEPIme (MAXIPIME) 2 g in sodium chloride 0.9 % 100 mL IVPB  Status:  Discontinued        2 g 200 mL/hr over 30 Minutes Intravenous Every 8 hours 04/11/22 0437 04/12/22 0717   04/11/22 0530  vancomycin (VANCOREADY) IVPB 2000 mg/400 mL        2,000 mg 200 mL/hr over 120 Minutes Intravenous  Once 04/11/22 0437 04/11/22 2008        MEDICATIONS: Scheduled Meds:  ARIPiprazole  10 mg Oral q morning   atorvastatin  10 mg Oral Daily   benztropine  1 mg Oral BID   Chlorhexidine Gluconate Cloth  6 each Topical Daily   diclofenac Sodium  2 g Topical QID   feeding supplement  237 mL Oral BID BM   insulin aspart  0-15 Units Subcutaneous Q4H   insulin glargine-yfgn  26 Units Subcutaneous Daily   liothyronine  10 mcg Oral Daily   mouth rinse  15 mL Mouth Rinse BID   metoprolol succinate  50 mg Oral Daily   multivitamin with minerals  1 tablet Oral Daily   nutrition supplement (JUVEN)  1 packet Oral BID BM   rivaroxaban  20 mg Oral Q supper   senna-docusate  2 tablet Oral BID   tamsulosin  0.4 mg Oral Daily   Continuous Infusions:  feeding supplement (JEVITY 1.5 CAL/FIBER) 1,000 mL  (05/04/22 2000)   PRN Meds:.acetaminophen **OR** acetaminophen, bisacodyl, HYDROcodone-acetaminophen, melatonin, morphine injection   I have personally reviewed following labs and imaging studies  LABORATORY DATA: CBC: Recent Labs  Lab 05/02/22 0200 05/05/22 0616  WBC 5.4 4.4  HGB 12.9* 12.8*  HCT 39.9 39.9  MCV 85.8 86.2  PLT 291 244     Basic Metabolic Panel: Recent Labs  Lab 05/02/22 0200 05/03/22 0115 05/03/22 1900 05/04/22 0549 05/04/22 1535 05/05/22 0616 05/08/22 0103  NA 134* 131*  --   --   --  133* 130*  K 4.8 4.9  --   --   --  4.8 4.4  CL 99 98  --   --   --  99 99  CO2 27 25  --   --   --  29 25  GLUCOSE 238* 151*  --   --   --  295* 341*  BUN 38* 42*  --   --   --  38* 29*  CREATININE 1.25* 1.04  --   --   --  0.98 0.87  CALCIUM 9.7 9.3  --   --   --  9.6 9.4  MG  --   --  2.0 2.1 2.0 2.0  --   PHOS  --   --  2.5 2.8 1.9* 2.2* 2.3*     GFR: Estimated Creatinine Clearance: 116.1 mL/min (by C-G formula based on SCr of 0.87 mg/dL).  Liver Function Tests: No results for input(s): "AST", "ALT", "ALKPHOS", "BILITOT", "PROT", "ALBUMIN" in the last 168 hours.  No results for input(s): "LIPASE", "AMYLASE" in the last 168 hours. No results for input(s): "AMMONIA" in the last 168 hours.  Coagulation Profile: No results for input(s): "INR", "PROTIME" in the last 168 hours.  Cardiac Enzymes: No results for input(s): "CKTOTAL", "CKMB", "CKMBINDEX", "TROPONINI" in the last 168 hours.   BNP (last 3 results) No results for input(s): "PROBNP" in the last 8760 hours.  Lipid Profile: No results for input(s): "CHOL", "HDL", "LDLCALC", "TRIG", "CHOLHDL", "LDLDIRECT" in the last 72 hours.  Thyroid Function Tests: No results for input(s): "TSH", "T4TOTAL", "FREET4", "T3FREE", "THYROIDAB" in the last 72 hours.  Anemia Panel: No results for input(s): "VITAMINB12", "FOLATE", "FERRITIN", "TIBC", "IRON", "RETICCTPCT" in the last 72 hours.  Urine analysis:     Component Value Date/Time   COLORURINE YELLOW 04/11/2022 0540   APPEARANCEUR HAZY (A) 04/11/2022 0540   APPEARANCEUR Cloudy 08/01/2014 1614   LABSPEC 1.017 04/11/2022 0540   LABSPEC 1.008 08/01/2014 1614   PHURINE 7.0 04/11/2022 0540   GLUCOSEU >=500 (A) 04/11/2022 0540   GLUCOSEU Negative 08/01/2014 1614   HGBUR NEGATIVE 04/11/2022 0540   BILIRUBINUR NEGATIVE 04/11/2022 0540   BILIRUBINUR Negative 08/01/2014 1614   KETONESUR NEGATIVE 04/11/2022 0540   PROTEINUR NEGATIVE 04/11/2022 0540   UROBILINOGEN 0.2 07/01/2014 1809   NITRITE NEGATIVE 04/11/2022 0540   LEUKOCYTESUR NEGATIVE 04/11/2022 0540   LEUKOCYTESUR Negative 08/01/2014 1614    Sepsis Labs: Lactic Acid, Venous    Component Value Date/Time   LATICACIDVEN 1.8 04/11/2022 0651    MICROBIOLOGY: No results found for this or any previous visit (from the past 240 hour(s)).   RADIOLOGY STUDIES/RESULTS: No results found.   LOS: 85 days   Oren Binet, MD  [facility]    To contact the attending provider between 7A-7P or the covering provider during after hours 7P-7A, please log into the web site [email or web address] and access using universal [place] password for that web site. If you do not have the password, please call the hospital operator.  05/08/2022, 1:31 PM

## 2022-05-09 DIAGNOSIS — E875 Hyperkalemia: Secondary | ICD-10-CM | POA: Diagnosis not present

## 2022-05-09 LAB — GLUCOSE, CAPILLARY
Glucose-Capillary: 233 mg/dL — ABNORMAL HIGH (ref 70–99)
Glucose-Capillary: 234 mg/dL — ABNORMAL HIGH (ref 70–99)
Glucose-Capillary: 256 mg/dL — ABNORMAL HIGH (ref 70–99)
Glucose-Capillary: 260 mg/dL — ABNORMAL HIGH (ref 70–99)
Glucose-Capillary: 278 mg/dL — ABNORMAL HIGH (ref 70–99)
Glucose-Capillary: 290 mg/dL — ABNORMAL HIGH (ref 70–99)
Glucose-Capillary: 343 mg/dL — ABNORMAL HIGH (ref 70–99)

## 2022-05-09 MED ORDER — INSULIN ASPART 100 UNIT/ML IJ SOLN
0.0000 [IU] | INTRAMUSCULAR | Status: DC
  Administered 2022-05-09 (×3): 11 [IU] via SUBCUTANEOUS
  Administered 2022-05-10: 7 [IU] via SUBCUTANEOUS
  Administered 2022-05-10: 4 [IU] via SUBCUTANEOUS
  Administered 2022-05-10: 7 [IU] via SUBCUTANEOUS
  Administered 2022-05-10 (×2): 4 [IU] via SUBCUTANEOUS
  Administered 2022-05-10: 15 [IU] via SUBCUTANEOUS
  Administered 2022-05-11 (×2): 7 [IU] via SUBCUTANEOUS
  Administered 2022-05-11 (×4): 11 [IU] via SUBCUTANEOUS
  Administered 2022-05-12: 15 [IU] via SUBCUTANEOUS
  Administered 2022-05-12 (×4): 4 [IU] via SUBCUTANEOUS
  Administered 2022-05-12: 7 [IU] via SUBCUTANEOUS
  Administered 2022-05-13: 4 [IU] via SUBCUTANEOUS
  Administered 2022-05-13: 7 [IU] via SUBCUTANEOUS
  Administered 2022-05-13: 4 [IU] via SUBCUTANEOUS
  Administered 2022-05-13: 7 [IU] via SUBCUTANEOUS
  Administered 2022-05-13: 4 [IU] via SUBCUTANEOUS
  Administered 2022-05-13: 11 [IU] via SUBCUTANEOUS
  Administered 2022-05-14: 7 [IU] via SUBCUTANEOUS
  Administered 2022-05-14: 4 [IU] via SUBCUTANEOUS
  Administered 2022-05-14: 7 [IU] via SUBCUTANEOUS
  Administered 2022-05-14: 4 [IU] via SUBCUTANEOUS
  Administered 2022-05-14: 7 [IU] via SUBCUTANEOUS
  Administered 2022-05-14: 1 [IU] via SUBCUTANEOUS
  Administered 2022-05-15: 4 [IU] via SUBCUTANEOUS
  Administered 2022-05-15: 7 [IU] via SUBCUTANEOUS
  Administered 2022-05-15: 4 [IU] via SUBCUTANEOUS
  Administered 2022-05-15: 7 [IU] via SUBCUTANEOUS
  Administered 2022-05-15 (×2): 3 [IU] via SUBCUTANEOUS
  Administered 2022-05-16 (×3): 7 [IU] via SUBCUTANEOUS
  Administered 2022-05-16: 3 [IU] via SUBCUTANEOUS
  Administered 2022-05-16: 4 [IU] via SUBCUTANEOUS
  Administered 2022-05-16: 3 [IU] via SUBCUTANEOUS
  Administered 2022-05-17 (×2): 4 [IU] via SUBCUTANEOUS
  Administered 2022-05-17: 3 [IU] via SUBCUTANEOUS
  Administered 2022-05-17: 4 [IU] via SUBCUTANEOUS
  Administered 2022-05-17 – 2022-05-18 (×3): 7 [IU] via SUBCUTANEOUS
  Administered 2022-05-18: 4 [IU] via SUBCUTANEOUS
  Administered 2022-05-18: 7 [IU] via SUBCUTANEOUS
  Administered 2022-05-18: 3 [IU] via SUBCUTANEOUS
  Administered 2022-05-19 (×3): 7 [IU] via SUBCUTANEOUS
  Administered 2022-05-19 (×2): 4 [IU] via SUBCUTANEOUS
  Administered 2022-05-19 (×2): 7 [IU] via SUBCUTANEOUS
  Administered 2022-05-20: 11 [IU] via SUBCUTANEOUS
  Administered 2022-05-20 (×2): 7 [IU] via SUBCUTANEOUS

## 2022-05-09 MED ORDER — INSULIN GLARGINE-YFGN 100 UNIT/ML ~~LOC~~ SOLN
32.0000 [IU] | Freq: Every day | SUBCUTANEOUS | Status: DC
Start: 2022-05-10 — End: 2022-05-14
  Administered 2022-05-10 – 2022-05-13 (×4): 32 [IU] via SUBCUTANEOUS
  Filled 2022-05-09 (×5): qty 0.32

## 2022-05-09 MED ORDER — INSULIN GLARGINE-YFGN 100 UNIT/ML ~~LOC~~ SOLN
6.0000 [IU] | Freq: Once | SUBCUTANEOUS | Status: AC
  Administered 2022-05-09: 6 [IU] via SUBCUTANEOUS
  Filled 2022-05-09: qty 0.06

## 2022-05-09 NOTE — Progress Notes (Signed)
PROGRESS NOTE        PATIENT DETAILS Name: Christian [name] Age: 71 y.o. Sex: male Date of Birth: [birth year] Admit Date: 03/30/2022 Admitting Physician Rise Patience, MD PCP:Sun, Gari Crown, MD  Brief Summary: Patient is a 71 y.o.  male with a history of A-fib on Xarelto, HFpEF, PAD, DM-2, bipolar disorder-presented with generalized weakness/sacral decubitus ulcer-was found to have hypoglycemia, hyperkalemia and subsequently admitted to the hospitalist service-see below for further details.  Thought to have extrapyramidal symptoms-evaluated by psych/neurology-psych medications adjusted with clinical improvement.  Unfortunately-further hospital course complicated by development of fever-due to probable Streptococcus bacteremia and infected decubitus ulcer.  See below for further details.  Significant events: 5/2>> admit to [facility] for weakness/hypoglycemia/hyperkalemia/rigidity-thought to have EPS 5/14>> febrile/tachycardic-Per nursing staff-discharge from prior existing sacral decubitus ulcer.  Started on broad-spectrum IV antibiotics.  Prelim blood culture positive for Streptococcus species. 5/19>> wound care starting hydrotherapy 6/05>>Cortak tube placed 6/07>>wound vac placed   Significant studies: 5/3>> x-ray pelvis: No fracture. 5/3>> CT head: No acute intracranial findings. 5/3>> x-ray left shoulder: Rotator cuff impingement 5/14>> CXR:No Obvious PNA 5/15>> left upper extremity Doppler: No DVT. 5/16>> CT abdomen/pelvis: Inferior sacral decubitus ulceration-no evidence of osteomyelitis. 5/16>> Echo: EF 45-50%, no obvious vegetations.  Significant microbiology data: 5/14>> blood culture: Streptococcus constellatus  5/15>> blood culture: No growth  Procedures:   Consults: Neurology, psychiatry, infectious disease,CCS  Subjective: Lying comfortably in bed-no major issues overnight.  Objective: Vitals: Blood pressure 113/84, pulse (!) 106,  temperature 98.1 F (36.7 C), temperature source Oral, resp. rate 17, height 6' (1.829 m), weight 136.1 kg, SpO2 97 %.   Exam: Gen Exam:Alert awake-not in any distress HEENT:atraumatic, normocephalic Chest: B/L clear to auscultation anteriorly CVS:S1S2 regular Abdomen:soft non tender, non distended Extremities:no edema Neurology: Generalized weakness-but nonfocal exam. Skin: no rash   Pertinent Labs/Radiology:    Latest Ref Rng & Units 05/05/2022    6:16 AM 05/02/2022    2:00 AM 04/30/2022    3:10 AM  CBC  WBC 4.0 - 10.5 K/uL 4.4  5.4  5.1   Hemoglobin 13.0 - 17.0 g/dL 12.8  12.9  12.6   Hematocrit 39.0 - 52.0 % 39.9  39.9  39.2   Platelets 150 - 400 K/uL 244  291  310     Lab Results  Component Value Date   NA 130 (L) 05/08/2022   K 4.4 05/08/2022   CL 99 05/08/2022   CO2 25 05/08/2022      Assessment/Plan: Rigidity/tremors/mildly elevated CK: Felt to be more of extrapyramidal syndrome rather than NMS at this point.  Managed with supportive care.  After neurology/psych evaluation-has been resumed on Abilify and Cogentin-Wellbutrin/Seroquel has been discontinued.  AKI on CKD stage IIIa: AKI likely hemodynamically mediated-improved-creatinine close to baseline.   Mild rhabdomyolysis: Nontraumatic-improved with supportive care.  Sepsis due to possible strep bacteremia-sacral decubitus ulcer with abscess :Sepsis physiology has resolved-has completed a course of antimicrobial therapy-completed hydrotherapy-wound care placed wound VAC on 6/7.    Acute metabolic encephalopathy: Due to sepsis-has resolved-relatively awake and alert.    Hyperkalemia: resolved  Hypoglycemia: Resolved with supportive care.  Do not resume Amaryl on discharge.  DM-2 (A1c 7.6 on 4/15): CBG remains on the higher side-increase Semglee to 32 units-change to resistant SSI.  Follow and adjust-as noted in prior notes-avoid aggressive/tight glycemic control due to risk of hypoglycemia.  Recent Labs     05/09/22 0003 05/09/22 0438 05/09/22 0750  GLUCAP 234* 260* 233*    Hypothyroidism: Continue Cytomel.  PAF: Rate controlled-continue Xarelto  PAD: Follow-up with vascular surgery in the outpatient setting  HFpEF: Volume status stable-suspect has some amount of edema in lower extremities due to third spacing.  Demadex remains on hold.  Bipolar disorder: Given concerns for EPS-on Abilify and Cogentin-Depakote/Wellbutrin and Seroquel held.  Left shoulder pain: Supportive care-likely rotator cuff injury.  Stable to be followed up by orthopedics in the outpatient setting  Left arm swelling: Likely due to IVF infiltration-could have superficial venous thrombosis-left upper extremity Doppler negative for DVT.  Left arm swelling has resolved.  OSA: CPAP nightly  Acute urinary retention: Failed voiding trial on 6/5-continue Flomax-Foley catheter remains in place-we will plan on continuing Foley catheter to prevent soilage of sacral wound.  Debility/deconditioning: Has had worsening of his chronic debility during this hospitalization-plans are of SNF on discharge.  Per family-he has been nonambulatory since his discharge from [place] in April 2023.  This likely resulted in decubitus ulcer that the patient presented with.  Failure to thrive syndrome/nutrition Status: core track tube in place due to poor oral intake.  Continue NG tube feedings.  Encourage oral intake-when oral intake improves sufficiently-plan is to do a calorie count to see if we can discontinue the NG tube.  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 (Active)  03/31/22 1933  Location: Sacrum  Location Orientation:  Mid  Staging: Stage 4 - Full thickness tissue loss with exposed bone, tendon or muscle.  Wound Description (Comments):   Present on Admission: Yes  Dressing Type Other (Comment) 05/08/22 1525     Pressure Injury 05/08/22 Heel Right Deep Tissue Pressure Injury - Purple or maroon localized area of discolored intact skin or blood-filled blister due to damage of underlying soft tissue from pressure and/or shear. (Active)  05/08/22 1600  Location: Heel  Location Orientation: Right  Staging: Deep Tissue Pressure Injury - Purple or maroon localized area of discolored intact skin or blood-filled blister due to damage of underlying soft tissue from pressure and/or shear.  Wound Description (Comments):   Present on Admission: No  Dressing Type None 05/08/22 1630   Obesity: Estimated body mass index is 40.69 kg/m as calculated from the following:   Height as of this encounter: 6' (1.829 m).   Weight as of this encounter: 136.1 kg.   Code status:   Code Status: Full Code   DVT Prophylaxis: rivaroxaban (XARELTO) tablet 20 mg    Family Communication: [name]-[phone]-updated 6/9  Disposition Plan: Status is: Inpatient Remains inpatient appropriate because: Medically stable to be discharged to [facility]has NG tube feeding in place-wound VAC.  Awaiting word from insurance company whether or not he will be approved for LTAC.   Planned Discharge Destination:Skilled nursing facility versus long-term acute care hospital.   Diet: Diet Order             Diet regular Room service appropriate? No; Fluid consistency: Thin  Diet effective now                     Antimicrobial agents: Anti-infectives (From admission, onward)    Start  Dose/Rate Route Frequency Ordered Stop   04/22/22 1015  linezolid (ZYVOX) tablet 600 mg        600 mg Oral Every 12 hours 04/22/22 0916 04/26/22 2110   04/15/22 0900  vancomycin (VANCOREADY) IVPB 1500 mg/300 mL  Status:  Discontinued         1,500 mg 150 mL/hr over 120 Minutes Intravenous Every 24 hours 04/14/22 0811 04/14/22 1005   04/14/22 2200  amoxicillin-clavulanate (AUGMENTIN) 875-125 MG per tablet 1 tablet        1 tablet Oral Every 12 hours 04/14/22 1331 04/23/22 2148   04/14/22 1100  DAPTOmycin (CUBICIN) 700 mg in sodium chloride 0.9 % IVPB  Status:  Discontinued        8 mg/kg  90.4 kg (Adjusted) 128 mL/hr over 30 Minutes Intravenous Daily 04/14/22 1005 04/22/22 0916   04/14/22 0900  vancomycin (VANCOREADY) IVPB 2000 mg/400 mL  Status:  Discontinued        2,000 mg 200 mL/hr over 120 Minutes Intravenous  Once 04/14/22 0811 04/14/22 1005   04/13/22 2200  metroNIDAZOLE (FLAGYL) tablet 500 mg  Status:  Discontinued        500 mg Oral Every 12 hours 04/13/22 1436 04/14/22 1331   04/12/22 0815  cefTRIAXone (ROCEPHIN) 2 g in sodium chloride 0.9 % 100 mL IVPB  Status:  Discontinued        2 g 200 mL/hr over 30 Minutes Intravenous Daily 04/12/22 0717 04/14/22 0811   04/12/22 0600  vancomycin (VANCOREADY) IVPB 1250 mg/250 mL  Status:  Discontinued        1,250 mg 166.7 mL/hr over 90 Minutes Intravenous Every 24 hours 04/11/22 0521 04/12/22 0717   04/11/22 1145  metroNIDAZOLE (FLAGYL) IVPB 500 mg  Status:  Discontinued        500 mg 100 mL/hr over 60 Minutes Intravenous Every 12 hours 04/11/22 1055 04/13/22 1436   04/11/22 0600  ceFEPIme (MAXIPIME) 2 g in sodium chloride 0.9 % 100 mL IVPB  Status:  Discontinued        2 g 200 mL/hr over 30 Minutes Intravenous Every 8 hours 04/11/22 0437 04/12/22 0717   04/11/22 0530  vancomycin (VANCOREADY) IVPB 2000 mg/400 mL        2,000 mg 200 mL/hr over 120 Minutes Intravenous  Once 04/11/22 0437 04/11/22 2008        MEDICATIONS: Scheduled Meds:  ARIPiprazole  10 mg Oral q morning   atorvastatin  10 mg Oral Daily   benztropine  1 mg Oral BID   Chlorhexidine Gluconate Cloth  6 each Topical Daily   diclofenac Sodium  2 g Topical QID   feeding supplement  237 mL Oral BID BM    insulin aspart  0-20 Units Subcutaneous Q4H   [START ON 05/10/2022] insulin glargine-yfgn  32 Units Subcutaneous Daily   liothyronine  10 mcg Oral Daily   mouth rinse  15 mL Mouth Rinse BID   metoprolol succinate  50 mg Oral Daily   multivitamin with minerals  1 tablet Oral Daily   nutrition supplement (JUVEN)  1 packet Oral BID BM   rivaroxaban  20 mg Oral Q supper   senna-docusate  2 tablet Oral BID   tamsulosin  0.4 mg Oral Daily   Continuous Infusions:  feeding supplement (JEVITY 1.5 CAL/FIBER) 1,000 mL (05/04/22 2000)   PRN Meds:.acetaminophen **OR** acetaminophen, bisacodyl, HYDROcodone-acetaminophen, melatonin, morphine injection   I have personally reviewed following labs and imaging studies  LABORATORY DATA: CBC: Recent Labs  Lab 05/05/22 0616  WBC 4.4  HGB 12.8*  HCT 39.9  MCV 86.2  PLT [id number]    Basic Metabolic Panel: Recent Labs  Lab 05/03/22 0115 05/03/22 1900 05/04/22 0549 05/04/22 1535 05/05/22 0616 05/08/22 0103  NA 131*  --   --   --  133* 130*  K 4.9  --   --   --  4.8 4.4  CL 98  --   --   --  99 99  CO2 25  --   --   --  29 25  GLUCOSE 151*  --   --   --  295* 341*  BUN 42*  --   --   --  38* 29*  CREATININE 1.04  --   --   --  0.98 0.87  CALCIUM 9.3  --   --   --  9.6 9.4  MG  --  2.0 2.1 2.0 2.0  --   PHOS  --  2.5 2.8 1.9* 2.2* 2.3*    GFR: Estimated Creatinine Clearance: 116.1 mL/min (by C-G formula based on SCr of 0.87 mg/dL).  Liver Function Tests: No results for input(s): "AST", "ALT", "ALKPHOS", "BILITOT", "PROT", "ALBUMIN" in the last 168 hours.  No results for input(s): "LIPASE", "AMYLASE" in the last 168 hours. No results for input(s): "AMMONIA" in the last 168 hours.  Coagulation Profile: No results for input(s): "INR", "PROTIME" in the last 168 hours.  Cardiac Enzymes: No results for input(s): "CKTOTAL", "CKMB", "CKMBINDEX", "TROPONINI" in the last 168 hours.   BNP (last 3 results) No results for input(s): "PROBNP" in  the last 8760 hours.  Lipid Profile: No results for input(s): "CHOL", "HDL", "LDLCALC", "TRIG", "CHOLHDL", "LDLDIRECT" in the last 72 hours.  Thyroid Function Tests: No results for input(s): "TSH", "T4TOTAL", "FREET4", "T3FREE", "THYROIDAB" in the last 72 hours.  Anemia Panel: No results for input(s): "VITAMINB12", "FOLATE", "FERRITIN", "TIBC", "IRON", "RETICCTPCT" in the last 72 hours.  Urine analysis:    Component Value Date/Time   COLORURINE YELLOW 04/11/2022 0540   APPEARANCEUR HAZY (A) 04/11/2022 0540   APPEARANCEUR Cloudy 08/01/2014 1614   LABSPEC 1.017 04/11/2022 0540   LABSPEC 1.008 08/01/2014 1614   PHURINE 7.0 04/11/2022 0540   GLUCOSEU >=500 (A) 04/11/2022 0540   GLUCOSEU Negative 08/01/2014 1614   HGBUR NEGATIVE 04/11/2022 0540   BILIRUBINUR NEGATIVE 04/11/2022 0540   BILIRUBINUR Negative 08/01/2014 1614   KETONESUR NEGATIVE 04/11/2022 0540   PROTEINUR NEGATIVE 04/11/2022 0540   UROBILINOGEN 0.2 07/01/2014 1809   NITRITE NEGATIVE 04/11/2022 0540   LEUKOCYTESUR NEGATIVE 04/11/2022 0540   LEUKOCYTESUR Negative 08/01/2014 1614    Sepsis Labs: Lactic Acid, Venous    Component Value Date/Time   LATICACIDVEN 1.8 04/11/2022 0651    MICROBIOLOGY: No results found for this or any previous visit (from the past 240 hour(s)).   RADIOLOGY STUDIES/RESULTS: No results found.   LOS: 16 days   Oren Binet, MD  [facility]    To contact the attending provider between 7A-7P or the covering provider during after hours 7P-7A, please log into the web site [email or web address] and access using universal [place] password for that web site. If you do not have the password, please call the hospital operator.  05/09/2022, 10:07 AM

## 2022-05-10 DIAGNOSIS — E875 Hyperkalemia: Secondary | ICD-10-CM | POA: Diagnosis not present

## 2022-05-10 LAB — GLUCOSE, CAPILLARY
Glucose-Capillary: 178 mg/dL — ABNORMAL HIGH (ref 70–99)
Glucose-Capillary: 184 mg/dL — ABNORMAL HIGH (ref 70–99)
Glucose-Capillary: 188 mg/dL — ABNORMAL HIGH (ref 70–99)
Glucose-Capillary: 223 mg/dL — ABNORMAL HIGH (ref 70–99)
Glucose-Capillary: 226 mg/dL — ABNORMAL HIGH (ref 70–99)
Glucose-Capillary: 342 mg/dL — ABNORMAL HIGH (ref 70–99)

## 2022-05-10 NOTE — Plan of Care (Signed)
  Problem: Education: Goal: Knowledge of General Education information will improve Description: Including pain rating scale, medication(s)/side effects and non-pharmacologic comfort measures Outcome: Progressing   Problem: Clinical Measurements: Goal: Ability to maintain clinical measurements within normal limits will improve Outcome: Progressing Goal: Will remain free from infection Outcome: Progressing Goal: Diagnostic test results will improve Outcome: Progressing Goal: Respiratory complications will improve Outcome: Progressing Goal: Cardiovascular complication will be avoided Outcome: Progressing   Problem: Activity: Goal: Risk for activity intolerance will decrease Outcome: Progressing   Problem: Nutrition: Goal: Adequate nutrition will be maintained Outcome: Progressing   Problem: Pain Managment: Goal: General experience of comfort will improve Outcome: Progressing   Problem: Safety: Goal: Ability to remain free from injury will improve Outcome: Progressing   Problem: Skin Integrity: Goal: Risk for impaired skin integrity will decrease Outcome: Progressing   Problem: Education: Goal: Ability to describe self-care measures that may prevent or decrease complications (Diabetes Survival Skills Education) will improve Outcome: Progressing Goal: Individualized Educational Video(s) Outcome: Progressing   Problem: Coping: Goal: Ability to adjust to condition or change in health will improve Outcome: Progressing   Problem: Fluid Volume: Goal: Ability to maintain a balanced intake and output will improve Outcome: Progressing   Problem: Health Behavior/Discharge Planning: Goal: Ability to identify and utilize available resources and services will improve Outcome: Progressing Goal: Ability to manage health-related needs will improve Outcome: Progressing   Problem: Metabolic: Goal: Ability to maintain appropriate glucose levels will improve Outcome: Progressing    Problem: Nutritional: Goal: Maintenance of adequate nutrition will improve Outcome: Progressing Goal: Progress toward achieving an optimal weight will improve Outcome: Progressing   Problem: Skin Integrity: Goal: Risk for impaired skin integrity will decrease Outcome: Progressing   Problem: Tissue Perfusion: Goal: Adequacy of tissue perfusion will improve Outcome: Progressing   

## 2022-05-10 NOTE — Consult Note (Signed)
WOC Nurse wound follow up Patient receiving care in [facility] 2W11. Assisted by RN for positioning for [facility] dressing change. Wound type: Stage 4 PI to sacral/coccyx area Measurement: 7 cm x 7 cmx 3 cm with a 7 cm tunnel at 6 o'clock that extends to the level of the anus. The actual "hole" of the wound measures 4 cm x 3 cm with surrounding tissue impairment. Wound bed: beefy read Drainage (amount, consistency, odor) some odor today Periwound: surrounding full thickness wound margin. Dressing procedure/placement/frequency: One piece of black foam tucked as far into the tunnel as possible and then the wound bed filled. Two barrier rings placed between the wound and the anus to facilitate the seal. Drape applied, bridged to right hip. Immediate seal obtained. Patient had been premedicated for pain prior to the change. Patient tolerated well.  Patient was incontinent of pudding consistency stool.  This may impair the seal at some point.  Additional supplies being requested by the [place] for Wednesday and Friday of this week.  Helmut Muster, RN, MSN, CWOCN, CNS-BC, pager [phone]

## 2022-05-10 NOTE — Progress Notes (Signed)
Patient added to DTP list for discharge planning. ? ?Johnny Latu, MSW, LCSW ?Transitions of Care  Clinical Social Worker II ?[phone] ? ?

## 2022-05-10 NOTE — Progress Notes (Signed)
PROGRESS NOTE        PATIENT DETAILS Name: Christian [name] Age: 71 y.o. Sex: male Date of Birth: 1951/10/25 Admit Date: 03/30/2022 Admitting Physician Rise Patience, MD PCP:Sun, Gari Crown, MD  Brief Summary: Patient is a 71 y.o.  male with a history of A-fib on Xarelto, HFpEF, PAD, DM-2, bipolar disorder-presented with generalized weakness/sacral decubitus ulcer-was found to have hypoglycemia, hyperkalemia and subsequently admitted to the hospitalist service-see below for further details.  Thought to have extrapyramidal symptoms-evaluated by psych/neurology-psych medications adjusted with clinical improvement.  Unfortunately-further hospital course complicated by development of fever-due to probable Streptococcus bacteremia and infected decubitus ulcer.  See below for further details.  Significant events: 5/2>> admit to [facility] for weakness/hypoglycemia/hyperkalemia/rigidity-thought to have EPS 5/14>> febrile/tachycardic-Per nursing staff-discharge from prior existing sacral decubitus ulcer.  Started on broad-spectrum IV antibiotics.  Prelim blood culture positive for Streptococcus species. 5/19>> wound care starting hydrotherapy 6/05>>Cortak tube placed 6/07>>wound vac placed   Significant studies: 5/3>> x-ray pelvis: No fracture. 5/3>> CT head: No acute intracranial findings. 5/3>> x-ray left shoulder: Rotator cuff impingement 5/14>> CXR:No Obvious PNA 5/15>> left upper extremity Doppler: No DVT. 5/16>> CT abdomen/pelvis: Inferior sacral decubitus ulceration-no evidence of osteomyelitis. 5/16>> Echo: EF 45-50%, no obvious vegetations.  Significant microbiology data: 5/14>> blood culture: Streptococcus constellatus  5/15>> blood culture: No growth  Procedures:   Consults: Neurology, psychiatry, infectious disease,CCS  Subjective: Lying comfortably in bed-no major issues overnight.  Objective: Vitals: Blood pressure 120/76, pulse (!) 105,  temperature 98 F (36.7 C), temperature source Oral, resp. rate 16, height 6' (1.829 m), weight 136.1 kg, SpO2 98 %.   Exam: Gen Exam:Alert awake-not in any distress HEENT:atraumatic, normocephalic Chest: B/L clear to auscultation anteriorly CVS:S1S2 regular Abdomen:soft non tender, non distended Extremities:no edema Neurology: Generalized weakness-but nonfocal exam. Skin: no rash   Pertinent Labs/Radiology:    Latest Ref Rng & Units 05/05/2022    6:16 AM 05/02/2022    2:00 AM 04/30/2022    3:10 AM  CBC  WBC 4.0 - 10.5 K/uL 4.4  5.4  5.1   Hemoglobin 13.0 - 17.0 g/dL 12.8  12.9  12.6   Hematocrit 39.0 - 52.0 % 39.9  39.9  39.2   Platelets 150 - 400 K/uL 244  291  310     Lab Results  Component Value Date   NA 130 (L) 05/08/2022   K 4.4 05/08/2022   CL 99 05/08/2022   CO2 25 05/08/2022      Assessment/Plan: Rigidity/tremors/mildly elevated CK: Felt to be more of extrapyramidal syndrome rather than NMS at this point.  Managed with supportive care.  After neurology/psych evaluation-has been resumed on Abilify and Cogentin-Wellbutrin/Seroquel has been discontinued.  AKI on CKD stage IIIa: AKI likely hemodynamically mediated-improved-creatinine close to baseline.   Mild rhabdomyolysis: Nontraumatic-improved with supportive care.  Sepsis due to possible strep bacteremia-sacral decubitus ulcer with abscess:Sepsis physiology has resolved-has completed a course of antimicrobial therapy-completed hydrotherapy-wound care placed wound VAC on 6/7.    Acute metabolic encephalopathy: Due to sepsis-has resolved-relatively awake and alert.    Hyperkalemia: resolved  Hypoglycemia: Resolved with supportive care.  Do not resume Amaryl on discharge.  DM-2 (A1c 7.6 on 4/15): CBG now stable-continue Semglee 30 units daily and SSI.  remains on the higher side-increase Semglee to 32 units-change to resistant SSI.    Recent Labs    05/10/22  0006 05/10/22 0421 05/10/22 0737  GLUCAP 188* 184*  178*     Hypothyroidism: Continue Cytomel.  PAF: Rate controlled-continue Xarelto  PAD: Follow-up with vascular surgery in the outpatient setting  HFpEF: Volume status stable-suspect has some amount of edema in lower extremities due to third spacing.  Demadex remains on hold.  Bipolar disorder: Given concerns for EPS-on Abilify and Cogentin-Depakote/Wellbutrin and Seroquel held.  Left shoulder pain: Supportive care-likely rotator cuff injury.  Stable to be followed up by orthopedics in the outpatient setting  Left arm swelling: Likely due to IVF infiltration-could have superficial venous thrombosis-left upper extremity Doppler negative for DVT.  Left arm swelling has resolved.  OSA: CPAP nightly  Acute urinary retention: Failed voiding trial on 6/5-continue Flomax-Foley catheter remains in place-we will plan on continuing Foley catheter to prevent soilage of sacral wound.  Debility/deconditioning: Has had worsening of his chronic debility during this hospitalization-plans are of SNF on discharge.  Per family-he has been nonambulatory since his discharge from [facility] in April 2023.  This likely resulted in decubitus ulcer that the patient presented with.  Failure to thrive syndrome/nutrition Status: core track tube in place due to poor oral intake.   Per nursing staff-only eating around 1015% of his meals-continue NG tube feedings.  Encourage oral intake-when oral intake improves sufficiently-plan is to do a calorie count to see if we can discontinue the NG tube.  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 (Active)  03/31/22 1933  Location: Sacrum  Location Orientation: Mid   Staging: Stage 4 - Full thickness tissue loss with exposed bone, tendon or muscle.  Wound Description (Comments):   Present on Admission: Yes  Dressing Type Negative pressure wound therapy 05/09/22 2003     Pressure Injury 05/08/22 Heel Right Deep Tissue Pressure Injury - Purple or maroon localized area of discolored intact skin or blood-filled blister due to damage of underlying soft tissue from pressure and/or shear. (Active)  05/08/22 1600  Location: Heel  Location Orientation: Right  Staging: Deep Tissue Pressure Injury - Purple or maroon localized area of discolored intact skin or blood-filled blister due to damage of underlying soft tissue from pressure and/or shear.  Wound Description (Comments):   Present on Admission: No  Dressing Type None 05/09/22 2003   Obesity: Estimated body mass index is 40.69 kg/m as calculated from the following:   Height as of this encounter: 6' (1.829 m).   Weight as of this encounter: 136.1 kg.   Code status:   Code Status: Full Code   DVT Prophylaxis: rivaroxaban (XARELTO) tablet 20 mg    Family Communication: [name]-[phone]-updated 6/9  Disposition Plan: Status is: Inpatient Remains inpatient appropriate because: Medically stable to be discharged to [facility].-has NG tube feeding in place-wound VAC.  Awaiting word from insurance company whether or not he will be approved for LTAC.   Planned Discharge Destination:Skilled nursing facility versus long-term acute care hospital.   Diet: Diet Order             Diet regular Room service appropriate? No; Fluid consistency: Thin  Diet effective now                     Antimicrobial agents: Anti-infectives (From admission, onward)  Start     Dose/Rate Route Frequency Ordered Stop   04/22/22 1015  linezolid (ZYVOX) tablet 600 mg        600 mg Oral Every 12 hours 04/22/22 0916 04/26/22 2110   04/15/22 0900  vancomycin (VANCOREADY) IVPB 1500 mg/300 mL  Status:   Discontinued        1,500 mg 150 mL/hr over 120 Minutes Intravenous Every 24 hours 04/14/22 0811 04/14/22 1005   04/14/22 2200  amoxicillin-clavulanate (AUGMENTIN) 875-125 MG per tablet 1 tablet        1 tablet Oral Every 12 hours 04/14/22 1331 04/23/22 2148   04/14/22 1100  DAPTOmycin (CUBICIN) 700 mg in sodium chloride 0.9 % IVPB  Status:  Discontinued        8 mg/kg  90.4 kg (Adjusted) 128 mL/hr over 30 Minutes Intravenous Daily 04/14/22 1005 04/22/22 0916   04/14/22 0900  vancomycin (VANCOREADY) IVPB 2000 mg/400 mL  Status:  Discontinued        2,000 mg 200 mL/hr over 120 Minutes Intravenous  Once 04/14/22 0811 04/14/22 1005   04/13/22 2200  metroNIDAZOLE (FLAGYL) tablet 500 mg  Status:  Discontinued        500 mg Oral Every 12 hours 04/13/22 1436 04/14/22 1331   04/12/22 0815  cefTRIAXone (ROCEPHIN) 2 g in sodium chloride 0.9 % 100 mL IVPB  Status:  Discontinued        2 g 200 mL/hr over 30 Minutes Intravenous Daily 04/12/22 0717 04/14/22 0811   04/12/22 0600  vancomycin (VANCOREADY) IVPB 1250 mg/250 mL  Status:  Discontinued        1,250 mg 166.7 mL/hr over 90 Minutes Intravenous Every 24 hours 04/11/22 0521 04/12/22 0717   04/11/22 1145  metroNIDAZOLE (FLAGYL) IVPB 500 mg  Status:  Discontinued        500 mg 100 mL/hr over 60 Minutes Intravenous Every 12 hours 04/11/22 1055 04/13/22 1436   04/11/22 0600  ceFEPIme (MAXIPIME) 2 g in sodium chloride 0.9 % 100 mL IVPB  Status:  Discontinued        2 g 200 mL/hr over 30 Minutes Intravenous Every 8 hours 04/11/22 0437 04/12/22 0717   04/11/22 0530  vancomycin (VANCOREADY) IVPB 2000 mg/400 mL        2,000 mg 200 mL/hr over 120 Minutes Intravenous  Once 04/11/22 0437 04/11/22 2008        MEDICATIONS: Scheduled Meds:  ARIPiprazole  10 mg Oral q morning   atorvastatin  10 mg Oral Daily   benztropine  1 mg Oral BID   Chlorhexidine Gluconate Cloth  6 each Topical Daily   diclofenac Sodium  2 g Topical QID   feeding supplement  237  mL Oral BID BM   insulin aspart  0-20 Units Subcutaneous Q4H   insulin glargine-yfgn  32 Units Subcutaneous Daily   liothyronine  10 mcg Oral Daily   mouth rinse  15 mL Mouth Rinse BID   metoprolol succinate  50 mg Oral Daily   multivitamin with minerals  1 tablet Oral Daily   nutrition supplement (JUVEN)  1 packet Oral BID BM   rivaroxaban  20 mg Oral Q supper   senna-docusate  2 tablet Oral BID   tamsulosin  0.4 mg Oral Daily   Continuous Infusions:  feeding supplement (JEVITY 1.5 CAL/FIBER) 1,000 mL (05/09/22 1523)   PRN Meds:.acetaminophen **OR** acetaminophen, bisacodyl, HYDROcodone-acetaminophen, melatonin, morphine injection   I have personally reviewed following labs and imaging studies  LABORATORY DATA: CBC: Recent  Labs  Lab 05/05/22 0616  WBC 4.4  HGB 12.8*  HCT 39.9  MCV 86.2  PLT 244     Basic Metabolic Panel: Recent Labs  Lab 05/03/22 1900 05/04/22 0549 05/04/22 1535 05/05/22 0616 05/08/22 0103  NA  --   --   --  133* 130*  K  --   --   --  4.8 4.4  CL  --   --   --  99 99  CO2  --   --   --  29 25  GLUCOSE  --   --   --  295* 341*  BUN  --   --   --  38* 29*  CREATININE  --   --   --  0.98 0.87  CALCIUM  --   --   --  9.6 9.4  MG 2.0 2.1 2.0 2.0  --   PHOS 2.5 2.8 1.9* 2.2* 2.3*     GFR: Estimated Creatinine Clearance: 116.1 mL/min (by C-G formula based on SCr of 0.87 mg/dL).  Liver Function Tests: No results for input(s): "AST", "ALT", "ALKPHOS", "BILITOT", "PROT", "ALBUMIN" in the last 168 hours.  No results for input(s): "LIPASE", "AMYLASE" in the last 168 hours. No results for input(s): "AMMONIA" in the last 168 hours.  Coagulation Profile: No results for input(s): "INR", "PROTIME" in the last 168 hours.  Cardiac Enzymes: No results for input(s): "CKTOTAL", "CKMB", "CKMBINDEX", "TROPONINI" in the last 168 hours.   BNP (last 3 results) No results for input(s): "PROBNP" in the last 8760 hours.  Lipid Profile: No results for  input(s): "CHOL", "HDL", "LDLCALC", "TRIG", "CHOLHDL", "LDLDIRECT" in the last 72 hours.  Thyroid Function Tests: No results for input(s): "TSH", "T4TOTAL", "FREET4", "T3FREE", "THYROIDAB" in the last 72 hours.  Anemia Panel: No results for input(s): "VITAMINB12", "FOLATE", "FERRITIN", "TIBC", "IRON", "RETICCTPCT" in the last 72 hours.  Urine analysis:    Component Value Date/Time   COLORURINE YELLOW 04/11/2022 0540   APPEARANCEUR HAZY (A) 04/11/2022 0540   APPEARANCEUR Cloudy 08/01/2014 1614   LABSPEC 1.017 04/11/2022 0540   LABSPEC 1.008 08/01/2014 1614   PHURINE 7.0 04/11/2022 0540   GLUCOSEU >=500 (A) 04/11/2022 0540   GLUCOSEU Negative 08/01/2014 1614   HGBUR NEGATIVE 04/11/2022 0540   BILIRUBINUR NEGATIVE 04/11/2022 0540   BILIRUBINUR Negative 08/01/2014 1614   KETONESUR NEGATIVE 04/11/2022 0540   PROTEINUR NEGATIVE 04/11/2022 0540   UROBILINOGEN 0.2 07/01/2014 1809   NITRITE NEGATIVE 04/11/2022 0540   LEUKOCYTESUR NEGATIVE 04/11/2022 0540   LEUKOCYTESUR Negative 08/01/2014 1614    Sepsis Labs: Lactic Acid, Venous    Component Value Date/Time   LATICACIDVEN 1.8 04/11/2022 0651    MICROBIOLOGY: No results found for this or any previous visit (from the past 240 hour(s)).   RADIOLOGY STUDIES/RESULTS: No results found.   LOS: 40 days   Oren Binet, MD  [facility]    To contact the attending provider between 7A-7P or the covering provider during after hours 7P-7A, please log into the web site [email or web address] and access using universal [place] password for that web site. If you do not have the password, please call the hospital operator.  05/10/2022, 10:04 AM

## 2022-05-10 NOTE — TOC Progression Note (Signed)
Transition of Care [facility]) - Progression Note    Patient Details  Name: Christian [name] MRN: [id number] Date of Birth: January 16, 1951  [facility] of [facility]) CM/SW Contact  Mearl Latin, LCSW Phone Number: 05/10/2022, 11:05 AM  Clinical Narrative:    CSW received notification from [employer] that appeal was denied. CSW reached out to [facility] to see if they can still accept patient. Handoff provided to DTP CSW.    Expected Discharge Plan: Skilled Nursing Facility Barriers to Discharge: Insurance Authorization  Expected Discharge Plan and Services Expected Discharge Plan: Skilled Nursing Facility In-house Referral: Clinical Social Work   Post Acute Care Choice: Skilled Nursing Facility Living arrangements for the past 2 months: Single Family Home, Skilled Nursing Facility                                       Social Determinants of Health (SDOH) Interventions    Readmission Risk Interventions    04/13/2022    3:06 PM 04/13/2022   10:43 AM 01/28/2022   10:17 AM  Readmission Risk Prevention Plan  Transportation Screening  Complete Complete  PCP or Specialist Appt within 5-7 Days   Complete  PCP or Specialist Appt within 3-5 Days Complete    Home Care Screening   Complete  Medication Review (RN CM)   Complete  HRI or Home Care Consult Complete    Social Work Consult for Recovery Care Planning/Counseling Complete    Palliative Care Screening Not Applicable    Medication Review [employer]) Referral to Pharmacy Complete   PCP or Specialist appointment within 3-5 days of discharge  Complete   HRI or Home Care Consult  Complete   SW Recovery Care/Counseling Consult  Complete   Palliative Care Screening  Not Applicable   Skilled Nursing Facility  Complete

## 2022-05-11 DIAGNOSIS — E875 Hyperkalemia: Secondary | ICD-10-CM | POA: Diagnosis not present

## 2022-05-11 LAB — GLUCOSE, CAPILLARY
Glucose-Capillary: 200 mg/dL — ABNORMAL HIGH (ref 70–99)
Glucose-Capillary: 220 mg/dL — ABNORMAL HIGH (ref 70–99)
Glucose-Capillary: 224 mg/dL — ABNORMAL HIGH (ref 70–99)
Glucose-Capillary: 243 mg/dL — ABNORMAL HIGH (ref 70–99)
Glucose-Capillary: 263 mg/dL — ABNORMAL HIGH (ref 70–99)
Glucose-Capillary: 286 mg/dL — ABNORMAL HIGH (ref 70–99)
Glucose-Capillary: 286 mg/dL — ABNORMAL HIGH (ref 70–99)

## 2022-05-11 MED ORDER — GLUCERNA 1.5 CAL PO LIQD
1000.0000 mL | ORAL | Status: DC
  Administered 2022-05-11 – 2022-05-16 (×6): 1000 mL
  Filled 2022-05-11 (×9): qty 1000

## 2022-05-11 MED ORDER — FREE WATER
150.0000 mL | Freq: Four times a day (QID) | Status: DC
  Administered 2022-05-11 – 2022-05-20 (×33): 150 mL

## 2022-05-11 MED ORDER — PROSOURCE TF PO LIQD
45.0000 mL | Freq: Three times a day (TID) | ORAL | Status: DC
  Administered 2022-05-11 – 2022-05-20 (×24): 45 mL
  Filled 2022-05-11 (×22): qty 45

## 2022-05-11 NOTE — Progress Notes (Signed)
PROGRESS NOTE        PATIENT DETAILS Name: Christian [name] Age: 71 y.o. Sex: male Date of Birth: 1951-09-24 Admit Date: 03/30/2022 Admitting Physician Rise Patience, MD PCP:Sun, Gari Crown, MD  Brief Summary: Patient is a 71 y.o.  male with a history of A-fib on Xarelto, HFpEF, PAD, DM-2, bipolar disorder-presented with generalized weakness/sacral decubitus ulcer-was found to have hypoglycemia, hyperkalemia and subsequently admitted to the hospitalist service-see below for further details.  Thought to have extrapyramidal symptoms-evaluated by psych/neurology-psych medications adjusted with clinical improvement.  Unfortunately-further hospital course complicated by development of fever-due to probable Streptococcus bacteremia and infected decubitus ulcer.  See below for further details.  Significant events: 5/2>> admit to [facility] for weakness/hypoglycemia/hyperkalemia/rigidity-thought to have EPS 5/14>> febrile/tachycardic-Per nursing staff-discharge from prior existing sacral decubitus ulcer.  Started on broad-spectrum IV antibiotics.  Prelim blood culture positive for Streptococcus species. 5/19>> wound care starting hydrotherapy 6/05>>Cortak tube placed 6/07>>wound vac placed   Significant studies: 5/3>> x-ray pelvis: No fracture. 5/3>> CT head: No acute intracranial findings. 5/3>> x-ray left shoulder: Rotator cuff impingement 5/14>> CXR:No Obvious PNA 5/15>> left upper extremity Doppler: No DVT. 5/16>> CT abdomen/pelvis: Inferior sacral decubitus ulceration-no evidence of osteomyelitis. 5/16>> Echo: EF 45-50%, no obvious vegetations.  Significant microbiology data: 5/14>> blood culture: Streptococcus constellatus  5/15>> blood culture: No growth  Procedures:   Consults: Neurology, psychiatry, infectious disease,CCS  Subjective: Per nursing tech-ate around 25% of his meals.  He is completely awake and alert today.  Objective: Vitals: Blood  pressure (!) 128/95, pulse (!) 107, temperature 98.3 F (36.8 C), temperature source Oral, resp. rate 16, height 6' (1.829 m), weight 136.1 kg, SpO2 97 %.   Exam: Gen Exam:Alert awake-not in any distress HEENT:atraumatic, normocephalic Chest: B/L clear to auscultation anteriorly CVS:S1S2 regular Abdomen:soft non tender, non distended Skin: no rash   Pertinent Labs/Radiology:    Latest Ref Rng & Units 05/05/2022    6:16 AM 05/02/2022    2:00 AM 04/30/2022    3:10 AM  CBC  WBC 4.0 - 10.5 K/uL 4.4  5.4  5.1   Hemoglobin 13.0 - 17.0 g/dL 12.8  12.9  12.6   Hematocrit 39.0 - 52.0 % 39.9  39.9  39.2   Platelets 150 - 400 K/uL 244  291  310     Lab Results  Component Value Date   NA 130 (L) 05/08/2022   K 4.4 05/08/2022   CL 99 05/08/2022   CO2 25 05/08/2022      Assessment/Plan: Rigidity/tremors/mildly elevated CK: Felt to be more of extrapyramidal syndrome rather than NMS at this point.  Managed with supportive care.  After neurology/psych evaluation-has been resumed on Abilify and Cogentin-Wellbutrin/Seroquel has been discontinued.  AKI on CKD stage IIIa: AKI likely hemodynamically mediated-improved-creatinine close to baseline.   Mild rhabdomyolysis: Nontraumatic-improved with supportive care.  Sepsis due to possible strep bacteremia-sacral decubitus ulcer with abscess:Sepsis physiology has resolved-has completed a course of antimicrobial therapy-completed hydrotherapy-wound care placed wound VAC on 6/7.    Acute metabolic encephalopathy: Due to sepsis-has resolved-relatively awake and alert.    Hyperkalemia: resolved  Hypoglycemia: Resolved with supportive care.  Do not resume Amaryl on discharge.  DM-2 (A1c 7.6 on 4/15): CBG now stable-continue Semglee 32 units and resistant SSI.  Watch closely as calorie count in progress-switching to nocturnal feeds.  May need to adjust regimen  Recent  Labs    05/11/22 0422 05/11/22 0723 05/11/22 1132  GLUCAP 220* 224* 243*      Hypothyroidism: Continue Cytomel.  PAF: Rate controlled-continue Xarelto  PAD: Follow-up with vascular surgery in the outpatient setting  HFpEF: Volume status stable-suspect has some amount of edema in lower extremities due to third spacing.  Demadex remains on hold.  Bipolar disorder: Given concerns for EPS-on Abilify and Cogentin-Depakote/Wellbutrin and Seroquel held.  Left shoulder pain: Supportive care-likely rotator cuff injury.  Stable to be followed up by orthopedics in the outpatient setting  Left arm swelling: Likely due to IVF infiltration-could have superficial venous thrombosis-left upper extremity Doppler negative for DVT.  Left arm swelling has resolved.  OSA: CPAP nightly  Acute urinary retention: Failed voiding trial on 6/5-continue Flomax-Foley catheter remains in place-we will plan on continuing Foley catheter to prevent soilage of sacral wound.  Debility/deconditioning: Has had worsening of his chronic debility during this hospitalization-plans are of SNF on discharge.  Per family-he has been nonambulatory since his discharge from [place] in April 2023.  This likely resulted in decubitus ulcer that the patient presented with.  Failure to thrive syndrome/nutrition Status: core track tube in place due to poor oral intake.  Ate approximately 25% of his breakfast this morning.  Have consulted dietitian to see if we can switch to nocturnal feeds-and start calorie count.  If he is able to consume a significant amount of caloric intake orally-we will discontinue NG tube feedings.  If not-he will likely require a PEG tube.  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 (Active)  03/31/22 1933  Location: Sacrum  Location Orientation: Mid  Staging: Stage 4 - Full thickness tissue loss with exposed bone, tendon or muscle.  Wound Description (Comments):   Present on Admission: Yes  Dressing Type Negative pressure wound therapy 05/09/22 2003     Pressure Injury 05/08/22 Heel Right Deep Tissue Pressure Injury - Purple or maroon localized area of discolored intact skin or blood-filled blister due to damage of underlying soft tissue from pressure and/or shear. (Active)  05/08/22 1600  Location: Heel  Location Orientation: Right  Staging: Deep Tissue Pressure Injury - Purple or maroon localized area of discolored intact skin or blood-filled blister due to damage of underlying soft tissue from pressure and/or shear.  Wound Description (Comments):   Present on Admission: No  Dressing Type Negative pressure wound therapy 05/11/22 1100   Obesity: Estimated body mass index is 40.69 kg/m as calculated from the following:   Height as of this encounter: 6' (1.829 m).   Weight as of this encounter: 136.1 kg.   Code status:   Code Status: Full Code   DVT Prophylaxis: rivaroxaban (XARELTO) tablet 20 mg    Family Communication: [name]-[phone]-updated 6/9  Disposition Plan: Status is: Inpatient Remains inpatient appropriate because: Has NG tube feeds-calorie count in progress to see if we can discontinue NG tube or if he would require back to.  Unfortunately denied by insurance for [facility].  Planned Discharge Destination:Skilled nursing facility    Diet: Diet Order             Diet regular Room service appropriate? No; Fluid consistency: Thin  Diet effective now  Antimicrobial agents: Anti-infectives (From admission, onward)    Start     Dose/Rate Route Frequency Ordered Stop   04/22/22 1015  linezolid (ZYVOX) tablet 600 mg        600 mg Oral Every 12 hours 04/22/22 0916 04/26/22 2110   04/15/22  0900  vancomycin (VANCOREADY) IVPB 1500 mg/300 mL  Status:  Discontinued        1,500 mg 150 mL/hr over 120 Minutes Intravenous Every 24 hours 04/14/22 0811 04/14/22 1005   04/14/22 2200  amoxicillin-clavulanate (AUGMENTIN) 875-125 MG per tablet 1 tablet        1 tablet Oral Every 12 hours 04/14/22 1331 04/23/22 2148   04/14/22 1100  DAPTOmycin (CUBICIN) 700 mg in sodium chloride 0.9 % IVPB  Status:  Discontinued        8 mg/kg  90.4 kg (Adjusted) 128 mL/hr over 30 Minutes Intravenous Daily 04/14/22 1005 04/22/22 0916   04/14/22 0900  vancomycin (VANCOREADY) IVPB 2000 mg/400 mL  Status:  Discontinued        2,000 mg 200 mL/hr over 120 Minutes Intravenous  Once 04/14/22 0811 04/14/22 1005   04/13/22 2200  metroNIDAZOLE (FLAGYL) tablet 500 mg  Status:  Discontinued        500 mg Oral Every 12 hours 04/13/22 1436 04/14/22 1331   04/12/22 0815  cefTRIAXone (ROCEPHIN) 2 g in sodium chloride 0.9 % 100 mL IVPB  Status:  Discontinued        2 g 200 mL/hr over 30 Minutes Intravenous Daily 04/12/22 0717 04/14/22 0811   04/12/22 0600  vancomycin (VANCOREADY) IVPB 1250 mg/250 mL  Status:  Discontinued        1,250 mg 166.7 mL/hr over 90 Minutes Intravenous Every 24 hours 04/11/22 0521 04/12/22 0717   04/11/22 1145  metroNIDAZOLE (FLAGYL) IVPB 500 mg  Status:  Discontinued        500 mg 100 mL/hr over 60 Minutes Intravenous Every 12 hours 04/11/22 1055 04/13/22 1436   04/11/22 0600  ceFEPIme (MAXIPIME) 2 g in sodium chloride 0.9 % 100 mL IVPB  Status:  Discontinued        2 g 200 mL/hr over 30 Minutes Intravenous Every 8 hours 04/11/22 0437 04/12/22 0717   04/11/22 0530  vancomycin (VANCOREADY) IVPB 2000 mg/400 mL        2,000 mg 200 mL/hr over 120 Minutes Intravenous  Once 04/11/22 0437 04/11/22 2008        MEDICATIONS: Scheduled Meds:  ARIPiprazole  10 mg Oral q morning   atorvastatin  10 mg Oral Daily   benztropine  1 mg Oral BID   Chlorhexidine Gluconate Cloth  6 each Topical Daily    diclofenac Sodium  2 g Topical QID   feeding supplement  237 mL Oral BID BM   feeding supplement (GLUCERNA 1.5 CAL)  1,000 mL Per Tube Q24H   feeding supplement (PROSource TF)  45 mL Per Tube TID   free water  150 mL Per Tube Q6H   insulin aspart  0-20 Units Subcutaneous Q4H   insulin glargine-yfgn  32 Units Subcutaneous Daily   liothyronine  10 mcg Oral Daily   mouth rinse  15 mL Mouth Rinse BID   metoprolol succinate  50 mg Oral Daily   multivitamin with minerals  1 tablet Oral Daily   nutrition supplement (JUVEN)  1 packet Oral BID BM   rivaroxaban  20 mg Oral Q supper   senna-docusate  2 tablet Oral BID   tamsulosin  0.4  mg Oral Daily   Continuous Infusions:   PRN Meds:.acetaminophen **OR** acetaminophen, bisacodyl, HYDROcodone-acetaminophen, melatonin, morphine injection   I have personally reviewed following labs and imaging studies  LABORATORY DATA: CBC: Recent Labs  Lab 05/05/22 0616  WBC 4.4  HGB 12.8*  HCT 39.9  MCV 86.2  PLT 244     Basic Metabolic Panel: Recent Labs  Lab 05/04/22 1535 05/05/22 0616 05/08/22 0103  NA  --  133* 130*  K  --  4.8 4.4  CL  --  99 99  CO2  --  29 25  GLUCOSE  --  295* 341*  BUN  --  38* 29*  CREATININE  --  0.98 0.87  CALCIUM  --  9.6 9.4  MG 2.0 2.0  --   PHOS 1.9* 2.2* 2.3*     GFR: Estimated Creatinine Clearance: 116.1 mL/min (by C-G formula based on SCr of 0.87 mg/dL).  Liver Function Tests: No results for input(s): "AST", "ALT", "ALKPHOS", "BILITOT", "PROT", "ALBUMIN" in the last 168 hours.  No results for input(s): "LIPASE", "AMYLASE" in the last 168 hours. No results for input(s): "AMMONIA" in the last 168 hours.  Coagulation Profile: No results for input(s): "INR", "PROTIME" in the last 168 hours.  Cardiac Enzymes: No results for input(s): "CKTOTAL", "CKMB", "CKMBINDEX", "TROPONINI" in the last 168 hours.   BNP (last 3 results) No results for input(s): "PROBNP" in the last 8760 hours.  Lipid  Profile: No results for input(s): "CHOL", "HDL", "LDLCALC", "TRIG", "CHOLHDL", "LDLDIRECT" in the last 72 hours.  Thyroid Function Tests: No results for input(s): "TSH", "T4TOTAL", "FREET4", "T3FREE", "THYROIDAB" in the last 72 hours.  Anemia Panel: No results for input(s): "VITAMINB12", "FOLATE", "FERRITIN", "TIBC", "IRON", "RETICCTPCT" in the last 72 hours.  Urine analysis:    Component Value Date/Time   COLORURINE YELLOW 04/11/2022 0540   APPEARANCEUR HAZY (A) 04/11/2022 0540   APPEARANCEUR Cloudy 08/01/2014 1614   LABSPEC 1.017 04/11/2022 0540   LABSPEC 1.008 08/01/2014 1614   PHURINE 7.0 04/11/2022 0540   GLUCOSEU >=500 (A) 04/11/2022 0540   GLUCOSEU Negative 08/01/2014 1614   HGBUR NEGATIVE 04/11/2022 0540   BILIRUBINUR NEGATIVE 04/11/2022 0540   BILIRUBINUR Negative 08/01/2014 1614   KETONESUR NEGATIVE 04/11/2022 0540   PROTEINUR NEGATIVE 04/11/2022 0540   UROBILINOGEN 0.2 07/01/2014 1809   NITRITE NEGATIVE 04/11/2022 0540   LEUKOCYTESUR NEGATIVE 04/11/2022 0540   LEUKOCYTESUR Negative 08/01/2014 1614    Sepsis Labs: Lactic Acid, Venous    Component Value Date/Time   LATICACIDVEN 1.8 04/11/2022 0651    MICROBIOLOGY: No results found for this or any previous visit (from the past 240 hour(s)).   RADIOLOGY STUDIES/RESULTS: No results found.   LOS: 41 days   Oren Binet, MD  [facility]    To contact the attending provider between 7A-7P or the covering provider during after hours 7P-7A, please log into the web site [email or web address] and access using universal [place] password for that web site. If you do not have the password, please call the hospital operator.  05/11/2022, 12:30 PM

## 2022-05-11 NOTE — Plan of Care (Signed)
  Problem: Education: Goal: Knowledge of General Education information will improve Description: Including pain rating scale, medication(s)/side effects and non-pharmacologic comfort measures Outcome: Progressing   Problem: Clinical Measurements: Goal: Ability to maintain clinical measurements within normal limits will improve Outcome: Progressing Goal: Will remain free from infection Outcome: Progressing Goal: Diagnostic test results will improve Outcome: Progressing Goal: Respiratory complications will improve Outcome: Progressing Goal: Cardiovascular complication will be avoided Outcome: Progressing   Problem: Activity: Goal: Risk for activity intolerance will decrease Outcome: Progressing   Problem: Nutrition: Goal: Adequate nutrition will be maintained Outcome: Progressing   Problem: Pain Managment: Goal: General experience of comfort will improve Outcome: Progressing   Problem: Safety: Goal: Ability to remain free from injury will improve Outcome: Progressing   Problem: Skin Integrity: Goal: Risk for impaired skin integrity will decrease Outcome: Progressing   Problem: Education: Goal: Ability to describe self-care measures that may prevent or decrease complications (Diabetes Survival Skills Education) will improve Outcome: Progressing Goal: Individualized Educational Video(s) Outcome: Progressing   Problem: Coping: Goal: Ability to adjust to condition or change in health will improve Outcome: Progressing   Problem: Fluid Volume: Goal: Ability to maintain a balanced intake and output will improve Outcome: Progressing   Problem: Health Behavior/Discharge Planning: Goal: Ability to identify and utilize available resources and services will improve Outcome: Progressing Goal: Ability to manage health-related needs will improve Outcome: Progressing   Problem: Metabolic: Goal: Ability to maintain appropriate glucose levels will improve Outcome: Progressing    Problem: Nutritional: Goal: Maintenance of adequate nutrition will improve Outcome: Progressing Goal: Progress toward achieving an optimal weight will improve Outcome: Progressing   Problem: Skin Integrity: Goal: Risk for impaired skin integrity will decrease Outcome: Progressing   Problem: Tissue Perfusion: Goal: Adequacy of tissue perfusion will improve Outcome: Progressing   

## 2022-05-11 NOTE — Inpatient Diabetes Management (Signed)
Inpatient Diabetes Program Recommendations  AACE/ADA: New Consensus Statement on Inpatient Glycemic Control (2015)  Target Ranges:  Prepandial:   less than 140 mg/dL      Peak postprandial:   less than 180 mg/dL (1-2 hours)      Critically ill patients:  140 - 180 mg/dL   Lab Results  Component Value Date   GLUCAP 224 (H) 05/11/2022   HGBA1C 7.6 (H) 03/13/2022    Review of Glycemic Control  Latest Reference Range & Units 05/10/22 07:37 05/10/22 12:23 05/10/22 15:36 05/10/22 20:21 05/11/22 00:05 05/11/22 04:22 05/11/22 07:23  Glucose-Capillary 70 - 99 mg/dL 037 (H) 048 (H) 889 (H) 342 (H) 263 (H) 220 (H) 224 (H)  (H): Data is abnormally high  Inpatient Diabetes Program Recommendations:   - If plan is to continue tube feeds consider: -Novolog 3 units Q4H for tube feed coverage (to be stopped or held in the event tube feeds stopped).   Thank you, Christian [name]. Ardene Remley, RN, MSN, CDE  Diabetes Coordinator Inpatient Glycemic Control Team Team Pager [phone] (8am-5pm) 05/11/2022 10:34 AM

## 2022-05-11 NOTE — Progress Notes (Signed)
Physical Therapy Treatment Patient Details Name: Christian [name] MRN: [id number] DOB: 1951/06/14 Today's Date: 05/11/2022   History of Present Illness Patient is a 71 y.o.  male presented with generalized weakness/sacral decubitus ulcer-was found to have hypoglycemia, hyperkalemia. Thought to have extrapyramidal symptoms-evaluated by psych/neurology-psych medications adjusted with clinical improvement.  Hospital course complicated by development of fever-due to probable Streptococcus bacteremia and infected decubitus ulcer. 5/19: wound care starting hydrotherapy. 6/05: Cortak tube placed  6/07: wound vac placed, hydro DC. PMH: A-fib on Xarelto, HFpEF, PAD, DM-2, bipolar disorder.    PT Comments    Pt received in supine, drowsy but agreeable to therapy session with encouragement. Pt able to perform rolling to L/R sides with mod/maxA and pt limited this date due to frequent BM (per RN pt had bowel prep meds). Unable to assess seated activity due to pt frequent BMs and wound vac dressing noted to be pulling away from his skin with fecal matter underneath it, needing to be changed, RN notified. Reviewed supine UE/LE exercises with pt with teachback method. RN notified pt may benefit from [facility] tilt bed to progress weight bearing/standing tolerance given significant deconditioning and pt noted to have feet pushing on foot board of his air bed even after being scooted toward HOB, tilt belt would allow for foot board lengthening and reduce risk of foot/heel pressure injuries. Pressure relief boots re-donned after hygiene assist. Pt continues to benefit from PT services to progress toward functional mobility goals.   Recommendations for follow up therapy are one component of a multi-disciplinary discharge planning process, led by the attending physician.  Recommendations may be updated based on patient status, additional functional criteria and insurance authorization.  Follow Up Recommendations  PT at  Long-term acute care hospital     Assistance Recommended at Discharge Frequent or constant Supervision/Assistance  Patient can return home with the following Two people to help with walking and/or transfers;Assistance with cooking/housework;Assist for transportation;Help with stairs or ramp for entrance;Two people to help with bathing/dressing/bathroom;Assistance with feeding;Direct supervision/assist for medications management;Direct supervision/assist for financial management   Equipment Recommendations  Other (comment) (mechanical lift)    Recommendations for Other Services       Precautions / Restrictions Precautions Precautions: Fall Precaution Comments: monitor HR, wound vac/cortrak Restrictions Weight Bearing Restrictions: No     Mobility  Bed Mobility Overal bed mobility: Needs Assistance Bed Mobility: Rolling Rolling: Max assist, +2 for safety/equipment         General bed mobility comments: pt rolling to L/R six times each side, limited due to frequent ongoing BMs after clean-up and multiple pad changes; defer EOB due to pt "not done" after second pad change; also RN notified his wound vac dressing was lifting off of sacral ulcer and had stool underneath so likely need dressing change, RN to notify wound care RN.    Transfers    General transfer comment: UTA, pt with frequent BMs after clean-up and had multiple bowel preps/meds so unable to attempt at this time.          Balance Overall balance assessment: Needs assistance, History of Falls Sitting-balance support: Feet supported, Bilateral upper extremity supported Sitting balance-Leahy Scale: Poor   Postural control: Right lateral lean Standing balance support: Bilateral upper extremity supported, Reliant on assistive device for balance Standing balance-Leahy Scale: Poor                              Cognition Arousal/Alertness:  Lethargic Behavior During Therapy: Flat affect Overall  Cognitive Status: Impaired/Different from baseline Area of Impairment: Attention, Following commands, Problem solving, Memory                   Current Attention Level: Sustained Memory: Decreased short-term memory Following Commands: Follows one step commands with increased time     Problem Solving: Slow processing, Decreased initiation, Difficulty sequencing, Requires verbal cues, Requires tactile cues General Comments: cooperative but drowsy, slow processing, limited due to frequent BMs; multimodal cues needed        Exercises Other Exercises Other Exercises: supine BUE AROM: chest press x10 reps Other Exercises: supine BLE A/AAROM: marching, SAQ, ankle pumps    General Comments General comments (skin integrity, edema, etc.): VSS on RA, wound vac dressing not fully intact/with stool on underside, RN notified.      Pertinent Vitals/Pain Pain Assessment Pain Assessment: Faces Faces Pain Scale: Hurts little more Pain Location: buttocks Pain Descriptors / Indicators: Grimacing, Guarding, Discomfort Pain Intervention(s): Limited activity within patient's tolerance, Monitored during session, Repositioned           PT Goals (current goals can now be found in the care plan section) Acute Rehab PT Goals Patient Stated Goal: to feel better PT Goal Formulation: With patient Time For Goal Achievement: 05/12/22 Progress towards PT goals: Progressing toward goals (limited progression due to frequent BMs)    Frequency    Min 2X/week      PT Plan Current plan remains appropriate       AM-PAC PT "6 Clicks" Mobility   Outcome Measure  Help needed turning from your back to your side while in a flat bed without using bedrails?: A Lot Help needed moving from lying on your back to sitting on the side of a flat bed without using bedrails?: Total Help needed moving to and from a bed to a chair (including a wheelchair)?: Total Help needed standing up from a chair using  your arms (e.g., wheelchair or bedside chair)?: Total Help needed to walk in hospital room?: Total Help needed climbing 3-5 steps with a railing? : Total 6 Click Score: 7    End of Session   Activity Tolerance: Patient limited by fatigue;Other (comment) (frequent loose stools after clean-up) Patient left: in bed;with call bell/phone within reach;Other (comment) (air boots donned, RN notified of wound vac dressing and likely to need more clean-up soon) Nurse Communication: Mobility status;Other (comment);Precautions (wound vac dressing pulling up, stool under dressing) PT Visit Diagnosis: Unsteadiness on feet (R26.81);History of falling (Z91.81);Repeated falls (R29.6);Difficulty in walking, not elsewhere classified (R26.2)     Time: 1201-1230 PT Time Calculation (min) (ACUTE ONLY): 29 min  Charges:  $Therapeutic Exercise: 8-22 mins $Therapeutic Activity: 8-22 mins                     Exa Bomba P., PTA Acute Rehabilitation Services Secure Chat Preferred 9a-5:30pm Office: [phone]    Dorathy Kinsman [facility] 05/11/2022, 3:05 PM

## 2022-05-11 NOTE — Progress Notes (Signed)
Nutrition Follow-up  DOCUMENTATION CODES:   Not applicable  INTERVENTION:   Continue Multivitamin w/ minerals daily Continue Ensure Enlive po BID Continue 1 packet Juven BID Feeding assist with all meals Encourage good PO intake  Recommend obtaining new weight. RN notified.  Calorie count Tube feeds via Cortrak:  Transition to nocturnal TF of Glucerna 1.5 @ 80 mL/hr x 12 hr (960 mL/day) 45 mL ProSource TF - TID 150 mL free water flush q6h Provides 1560 kcal, 112 gm protein, and 729 mL of free water (1329 mL total free water) daily.  NUTRITION DIAGNOSIS:   Increased nutrient needs related to chronic illness, wound healing as evidenced by estimated needs. - Ongoing  GOAL:   Patient will meet greater than or equal to 90% of their needs - Ongoing  MONITOR:   PO intake, Supplement acceptance, Labs, TF tolerance, Skin  REASON FOR ASSESSMENT:   Consult Enteral/tube feeding initiation and management  ASSESSMENT:   71 y.o. male presented to the ED with generalized weakness. Pt recently discharged from rehab after hospital admission due to hypoglycemia cellulitis. PMH includes CHF, CKD III, HTN, and T2DM. Pt admitted with hypoglycemia, hyperkalemia, and generalized weakness.   5/11 - diet downgraded to Dysphagia 1, Nectar Thick Liquids 5/15 - NPO 5/16 - diet advanced to Dysphagia 1, Nectar Thick Liquids 5/18 - diet advanced to thin liquids 5/19 - Hydrotherapy started 5/25 - diet advanced to Dysphagia 2 6/02 - diet advanced to regular 6/05 - Cortrak placed (tip gastric) 6/07 - wound VAC applied to sacrum  RD received a consult for a calories count and to transition pt to nocturnal TF to encourage good PO intake throughout the day.   Per EMR, pt PO intake includes; 6/10: Dinner 15% 6/11: Breakfast 10%, Lunch 5%, Dinner 5% 6/12: Dinner 0% 6/13: Breakfast 25%  Transition to nocturnal feeds tonight. Initiate calorie count, RD to follow-up with results in days to follow.    Medications reviewed and include: SSI 0-20 units q4h, Semglee, MVI, Senokot Labs reviewed: 24 hr CBG 220-342  Diet Order:   Diet Order             Diet regular Room service appropriate? No; Fluid consistency: Thin  Diet effective now                  EDUCATION NEEDS:   No education needs have been identified at this time  Skin:  Skin Assessment: Skin Integrity Issues: Skin Integrity Issues:: Unstageable Stage III: Sacrum Unstageable: Coccyx & Buttocks  Last BM:  6/12  Height:  Ht Readings from Last 1 Encounters:  04/17/22 6' (1.829 m)   Weight:  Wt Readings from Last 1 Encounters:  05/06/22 136.1 kg   Ideal Body Weight:  80.9 kg  BMI:  Body mass index is 40.69 kg/m.  Estimated Nutritional Needs:  Kcal:  2100-2300 Protein:  105-120 grams Fluid:  >/= 2.1 L   Kirby Crigler RD, LDN Clinical Dietitian See [facility] for contact information.

## 2022-05-12 DIAGNOSIS — R748 Abnormal levels of other serum enzymes: Secondary | ICD-10-CM | POA: Diagnosis not present

## 2022-05-12 DIAGNOSIS — L89152 Pressure ulcer of sacral region, stage 2: Secondary | ICD-10-CM | POA: Diagnosis not present

## 2022-05-12 DIAGNOSIS — R7881 Bacteremia: Secondary | ICD-10-CM | POA: Diagnosis not present

## 2022-05-12 DIAGNOSIS — R627 Adult failure to thrive: Secondary | ICD-10-CM | POA: Diagnosis not present

## 2022-05-12 LAB — GLUCOSE, CAPILLARY
Glucose-Capillary: 185 mg/dL — ABNORMAL HIGH (ref 70–99)
Glucose-Capillary: 166 mg/dL — ABNORMAL HIGH (ref 70–99)
Glucose-Capillary: 239 mg/dL — ABNORMAL HIGH (ref 70–99)
Glucose-Capillary: 308 mg/dL — ABNORMAL HIGH (ref 70–99)
Glucose-Capillary: 184 mg/dL — ABNORMAL HIGH (ref 70–99)

## 2022-05-12 MED ORDER — LOPERAMIDE HCL 2 MG PO CAPS
2.0000 mg | ORAL_CAPSULE | ORAL | Status: DC | PRN
  Administered 2022-05-12 – 2022-05-13 (×3): 2 mg via ORAL
  Filled 2022-05-12 (×3): qty 1

## 2022-05-12 NOTE — Progress Notes (Signed)
Occupational Therapy Treatment Patient Details Name: Christian [name] MRN: [id number] DOB: 1951/03/14 Today's Date: 05/12/2022   History of present illness Patient is a 71 y.o.  male presented with generalized weakness/sacral decubitus ulcer-was found to have hypoglycemia, hyperkalemia. Thought to have extrapyramidal symptoms-evaluated by psych/neurology-psych medications adjusted with clinical improvement.  Hospital course complicated by development of fever-due to probable Streptococcus bacteremia and infected decubitus ulcer. 5/19: wound care starting hydrotherapy. 6/05: Cortak tube placed  6/07: wound vac placed, hydro DC. PMH: A-fib on Xarelto, HFpEF, PAD, DM-2, bipolar disorder.   OT comments  Patient received in supine and declined getting to EOB but agreed to address self feeding. Patient required setup and occasional assist to load utensil due to patient often attempting to take too big of a bite. Patient was able to manage cup without assistance. Patient required frequent cues for encouragement to continue to feed self. Patient performed face and hand hygiene following meal with min assist. Acute OT to continue to follow.    Recommendations for follow up therapy are one component of a multi-disciplinary discharge planning process, led by the attending physician.  Recommendations may be updated based on patient status, additional functional criteria and insurance authorization.    Follow Up Recommendations  Skilled nursing-short term rehab (<3 hours/day)    Assistance Recommended at Discharge Frequent or constant Supervision/Assistance  Patient can return home with the following  Two people to help with walking and/or transfers;Two people to help with bathing/dressing/bathroom;Assistance with feeding;Assistance with cooking/housework;Direct supervision/assist for medications management;Direct supervision/assist for financial management;Assist for transportation;Help with stairs or ramp for  entrance   Equipment Recommendations  Other (comment) (TBD)    Recommendations for Other Services      Precautions / Restrictions Precautions Precautions: Fall Precaution Comments: monitor HR, wound vac/cortrak Restrictions Weight Bearing Restrictions: No       Mobility Bed Mobility Overal bed mobility: Needs Assistance             General bed mobility comments: seen in bed with HOB raised to address self feeding    Transfers Overall transfer level: Needs assistance                 General transfer comment: seen at bed level     Balance Overall balance assessment: Needs assistance, History of Falls     Sitting balance - Comments: declined sitting on EOB                                   ADL either performed or assessed with clinical judgement   ADL Overall ADL's : Needs assistance/impaired Eating/Feeding: Minimal assistance;Bed level Eating/Feeding Details (indicate cue type and reason): required setup and often assistance to load utensil due to patient attempting to take too big of a bite Grooming: Wash/dry hands;Wash/dry face;Minimal assistance;Bed level                                 General ADL Comments: addressed self feeding at bed level    Extremity/Trunk Assessment              Vision       Perception     Praxis      Cognition Arousal/Alertness: Awake/alert Behavior During Therapy: Flat affect Overall Cognitive Status: Impaired/Different from baseline Area of Impairment: Attention, Following commands, Problem solving, Memory  Current Attention Level: Sustained Memory: Decreased short-term memory Following Commands: Follows one step commands with increased time     Problem Solving: Slow processing, Decreased initiation, Difficulty sequencing, Requires verbal cues, Requires tactile cues General Comments: able to give date        Exercises      Shoulder Instructions        General Comments      Pertinent Vitals/ Pain       Pain Assessment Pain Assessment: Faces Faces Pain Scale: Hurts a little bit Pain Location: buttocks Pain Descriptors / Indicators: Grimacing, Discomfort Pain Intervention(s): Monitored during session  Home Living                                          Prior Functioning/Environment              Frequency  Min 2X/week        Progress Toward Goals  OT Goals(current goals can now be found in the care plan section)  Progress towards OT goals: Progressing toward goals  Acute Rehab OT Goals Patient Stated Goal: get better OT Goal Formulation: With patient Time For Goal Achievement: 05/20/22 Potential to Achieve Goals: Fair ADL Goals Pt Will Perform Eating: with min assist;sitting;bed level Pt Will Perform Grooming: with mod assist;bed level Pt Will Perform Lower Body Dressing: with modified independence;with adaptive equipment;sit to/from stand;sitting/lateral leans Pt Will Transfer to Toilet: with modified independence;regular height toilet;ambulating Pt Will Perform Toileting - Clothing Manipulation and hygiene: with modified independence;sit to/from stand;sitting/lateral leans Pt/caregiver will Perform Home Exercise Program: Increased ROM;Increased strength;Both right and left upper extremity;With minimal assist Additional ADL Goal #1: Pt will perform bed mobility with moderate assistance in preparation for ADLs Additional ADL Goal #2: Pt will demonstrate fair sitting balance x 10 minutes at a precursor to ADLs.  Plan Discharge plan remains appropriate    Co-evaluation                 AM-PAC OT "6 Clicks" Daily Activity     Outcome Measure   Help from another person eating meals?: A Little Help from another person taking care of personal grooming?: A Lot Help from another person toileting, which includes using toliet, bedpan, or urinal?: Total Help from another person bathing  (including washing, rinsing, drying)?: Total Help from another person to put on and taking off regular upper body clothing?: Total Help from another person to put on and taking off regular lower body clothing?: Total 6 Click Score: 9    End of Session    OT Visit Diagnosis: Muscle weakness (generalized) (M62.81);Pain   Activity Tolerance Patient tolerated treatment well   Patient Left in bed;with call bell/phone within reach   Nurse Communication Other (comment) (% of meal consummed)        Time: 1779-3903 OT Time Calculation (min): 39 min  Charges: OT General Charges $OT Visit: 1 Visit OT Treatments $Self Care/Home Management : 38-52 mins  Alfonse Flavors, OTA Acute Rehabilitation Services  Pager [phone] Office [phone]   Dewain Penning 05/12/2022, 1:58 PM

## 2022-05-12 NOTE — Progress Notes (Signed)
Calorie Count Note  48 - hour calorie count ordered.  Diet: Regular Supplements: Ensure Enlive - BID  Day 1 Results 6/13 Lunch: 80 kcal, 0 gm protein  6/13 Dinner: 101 kcal, 4 gm protein 6/14 Breakfast: 176 kcal, 5 gm protein Supplements: 700 kcal, 40 gm protein  Total intake: 1057 kcal (50% of minimum estimated needs)  49 protein (47% of minimum estimated needs)  Nutrition Diagnosis: Increased nutrient needs related to chronic illness, wound healing as evidenced by estimated needs.  Goal: Patient will meet greater than or equal to 90% of their needs.  Intervention:  Ensure Enlive - BID Juven - BID Multivitamin w/ minerals daily Nocturnal Tube Feeds via Cortrak   Kirby Crigler RD, LDN Clinical Dietitian See [facility] for contact information.

## 2022-05-12 NOTE — Progress Notes (Signed)
PROGRESS NOTE        PATIENT DETAILS Name: Christian [name] Age: 71 y.o. Sex: male Date of Birth: 1951/05/10 Admit Date: 03/30/2022 Admitting Physician Rise Patience, MD PCP:Sun, Gari Crown, MD  Brief Summary: Patient is a 71 y.o.  male with a history of A-fib on Xarelto, HFpEF, PAD, DM-2, bipolar disorder-presented with generalized weakness/sacral decubitus ulcer-was found to have hypoglycemia, hyperkalemia and subsequently admitted to the hospitalist service-see below for further details.  Thought to have extrapyramidal symptoms-evaluated by psych/neurology-psych medications adjusted with clinical improvement.  Unfortunately-further hospital course complicated by development of fever-due to probable Streptococcus bacteremia and infected decubitus ulcer.  See below for further details.  Significant events: 5/2>> admit to [facility] for weakness/hypoglycemia/hyperkalemia/rigidity-thought to have EPS 5/14>> febrile/tachycardic-Per nursing staff-discharge from prior existing sacral decubitus ulcer.  Started on broad-spectrum IV antibiotics.  Prelim blood culture positive for Streptococcus species. 5/19>> wound care starting hydrotherapy 6/05>>Cortak tube placed 6/07>>wound vac placed 6/13>> calorie count started   Significant studies: 5/3>> x-ray pelvis: No fracture. 5/3>> CT head: No acute intracranial findings. 5/3>> x-ray left shoulder: Rotator cuff impingement 5/14>> CXR:No Obvious PNA 5/15>> left upper extremity Doppler: No DVT. 5/16>> CT abdomen/pelvis: Inferior sacral decubitus ulceration-no evidence of osteomyelitis. 5/16>> Echo: EF 45-50%, no obvious vegetations.  Significant microbiology data: 5/14>> blood culture: Streptococcus constellatus  5/15>> blood culture: No growth  Procedures:   Consults: Neurology, psychiatry, infectious disease,CCS  Subjective: No major issues overnight-spoke with patient-he is not keen on the idea of getting a PEG  tube-I have explained that he needs to do better with his oral intake otherwise we may really not have an option.  Objective: Vitals: Blood pressure (!) 139/92, pulse (!) 110, temperature 99 F (37.2 C), temperature source Oral, resp. rate 18, height 6' (1.829 m), weight 136.1 kg, SpO2 98 %.   Exam: Gen Exam:Alert awake-not in any distress HEENT:atraumatic, normocephalic Chest: B/L clear to auscultation anteriorly CVS:S1S2 regular Abdomen:soft non tender, non distended Skin: no rash   Pertinent Labs/Radiology:    Latest Ref Rng & Units 05/05/2022    6:16 AM 05/02/2022    2:00 AM 04/30/2022    3:10 AM  CBC  WBC 4.0 - 10.5 K/uL 4.4  5.4  5.1   Hemoglobin 13.0 - 17.0 g/dL 12.8  12.9  12.6   Hematocrit 39.0 - 52.0 % 39.9  39.9  39.2   Platelets 150 - 400 K/uL 244  291  310     Lab Results  Component Value Date   NA 130 (L) 05/08/2022   K 4.4 05/08/2022   CL 99 05/08/2022   CO2 25 05/08/2022      Assessment/Plan: Rigidity/tremors/mildly elevated CK: Felt to be more of extrapyramidal syndrome rather than NMS at this point.  Managed with supportive care.  After neurology/psych evaluation-has been resumed on Abilify and Cogentin-Wellbutrin/Seroquel has been discontinued.  AKI on CKD stage IIIa: AKI likely hemodynamically mediated-improved-creatinine close to baseline.   Mild rhabdomyolysis: Nontraumatic-improved with supportive care.  Sepsis due to possible strep bacteremia-sacral decubitus ulcer with abscess:Sepsis physiology has resolved-has completed a course of antimicrobial therapy-completed hydrotherapy-wound care placed wound VAC on 6/7.    Acute metabolic encephalopathy: Due to sepsis-has resolved-relatively awake and alert.    Hyperkalemia: resolved  Hypoglycemia: Resolved with supportive care.  Do not resume Amaryl on discharge.  DM-2 (A1c 7.6 on 4/15): CBG now stable-continue  Semglee 32 units and resistant SSI.  Watch closely as calorie count in progress-switching to  nocturnal feeds.  May need to adjust regimen  Recent Labs    05/11/22 2342 05/12/22 0331 05/12/22 0754  GLUCAP 200* 185* 166*     Hypothyroidism: Continue Cytomel.  PAF: Rate controlled-continue Xarelto  PAD: Follow-up with vascular surgery in the outpatient setting  HFpEF: Volume status stable-suspect has some amount of edema in lower extremities due to third spacing.  Demadex remains on hold.  Bipolar disorder: Given concerns for EPS-on Abilify and Cogentin-Depakote/Wellbutrin and Seroquel held.  Left shoulder pain: Supportive care-likely rotator cuff injury.  Stable to be followed up by orthopedics in the outpatient setting  Left arm swelling: Likely due to IVF infiltration-could have superficial venous thrombosis-left upper extremity Doppler negative for DVT.  Left arm swelling has resolved.  OSA: CPAP nightly  Acute urinary retention: Failed voiding trial on 6/5-continue Flomax-Foley catheter remains in place-we will plan on continuing Foley catheter to prevent soilage of sacral wound.  Debility/deconditioning: Has had worsening of his chronic debility during this hospitalization-plans are of SNF on discharge.  Per family-he has been nonambulatory since his discharge from [facility] in April 2023.  This likely resulted in decubitus ulcer that the patient presented with.  Failure to thrive syndrome/nutrition Status: cortrack tube in place due to poor oral intake.  Calorie count in process-feeding switch to nocturnal feeds only.  Long discussion with patient today-he is not keen on a PEG tube-but understands that if he is not able to to consume enough calories via oral intake then he will likely require PEG tube.  I have discussed this with his daughter as well.  Nursing staff aware that we need to encourage him to eat as much as we can-he will reassess on 6/15.    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 (Active)  03/31/22 1933  Location: Sacrum  Location Orientation: Mid  Staging: Stage 4 - Full thickness tissue loss with exposed bone, tendon or muscle.  Wound Description (Comments):   Present on Admission: Yes  Dressing Type Moist to dry;ABD;Paper tape 05/12/22 0930     Pressure Injury 05/08/22 Heel Right Deep Tissue Pressure Injury - Purple or maroon localized area of discolored intact skin or blood-filled blister due to damage of underlying soft tissue from pressure and/or shear. (Active)  05/08/22 1600  Location: Heel  Location Orientation: Right  Staging: Deep Tissue Pressure Injury - Purple or maroon localized area of discolored intact skin or blood-filled blister due to damage of underlying soft tissue from pressure and/or shear.  Wound Description (Comments):   Present on Admission: No  Dressing Type Negative pressure wound therapy 05/11/22 2116   Obesity: Estimated body mass index is 40.69 kg/m as calculated from the following:   Height as of this encounter: 6' (1.829 m).   Weight as of this encounter: 136.1 kg.   Code status:   Code Status: Full Code   DVT Prophylaxis: rivaroxaban (XARELTO) tablet 20 mg    Family Communication: [name]-updated 6/14  Disposition Plan: Status is: Inpatient Remains inpatient appropriate because: Has NG tube feeds-calorie count in progress to see if we can discontinue NG tube or if he would require back to.  Unfortunately denied by insurance for [facility].  Planned Discharge Destination:Skilled nursing facility    Diet: Diet Order             Diet regular Room service appropriate? No; Fluid consistency: Thin  Diet effective now                      Antimicrobial agents: Anti-infectives (From admission, onward)    Start     Dose/Rate Route Frequency Ordered Stop   04/22/22 1015  linezolid (ZYVOX) tablet 600 mg        600 mg Oral Every 12 hours 04/22/22 0916 04/26/22 2110   04/15/22 0900  vancomycin (VANCOREADY) IVPB 1500 mg/300 mL  Status:  Discontinued        1,500 mg 150 mL/hr over 120 Minutes Intravenous Every 24 hours 04/14/22 0811 04/14/22 1005   04/14/22 2200  amoxicillin-clavulanate (AUGMENTIN) 875-125 MG per tablet 1 tablet        1 tablet Oral Every 12 hours 04/14/22 1331 04/23/22 2148   04/14/22 1100  DAPTOmycin (CUBICIN) 700 mg in sodium chloride 0.9 % IVPB  Status:  Discontinued        8 mg/kg  90.4 kg (Adjusted) 128 mL/hr over 30 Minutes Intravenous Daily 04/14/22 1005 04/22/22 0916   04/14/22 0900  vancomycin (VANCOREADY) IVPB 2000 mg/400 mL  Status:  Discontinued        2,000 mg 200 mL/hr over 120 Minutes Intravenous  Once 04/14/22 0811 04/14/22 1005   04/13/22 2200  metroNIDAZOLE (FLAGYL) tablet 500 mg  Status:  Discontinued        500 mg Oral Every 12 hours 04/13/22 1436 04/14/22 1331   04/12/22 0815  cefTRIAXone (ROCEPHIN) 2 g in sodium chloride 0.9 % 100 mL IVPB  Status:  Discontinued        2 g 200 mL/hr over 30 Minutes Intravenous Daily 04/12/22 0717 04/14/22 0811   04/12/22 0600  vancomycin (VANCOREADY) IVPB 1250 mg/250 mL  Status:  Discontinued        1,250 mg 166.7 mL/hr over 90 Minutes Intravenous Every 24 hours 04/11/22 0521 04/12/22 0717   04/11/22 1145  metroNIDAZOLE (FLAGYL) IVPB 500 mg  Status:  Discontinued        500 mg 100 mL/hr over 60 Minutes Intravenous Every 12 hours 04/11/22 1055 04/13/22 1436   04/11/22 0600  ceFEPIme (MAXIPIME) 2 g in sodium chloride 0.9 % 100 mL IVPB  Status:  Discontinued        2 g 200 mL/hr over 30 Minutes Intravenous Every 8 hours 04/11/22 0437 04/12/22 0717   04/11/22 0530  vancomycin (VANCOREADY) IVPB 2000 mg/400 mL        2,000 mg 200 mL/hr over 120 Minutes  Intravenous  Once 04/11/22 0437 04/11/22 2008        MEDICATIONS: Scheduled Meds:  ARIPiprazole  10 mg Oral q morning   atorvastatin  10 mg Oral Daily   benztropine  1 mg Oral BID   Chlorhexidine Gluconate Cloth  6 each Topical Daily   diclofenac Sodium  2 g Topical QID   feeding supplement  237 mL Oral BID BM   feeding supplement (GLUCERNA 1.5 CAL)  1,000 mL Per Tube Q24H   feeding supplement (PROSource TF)  45 mL Per Tube TID   free water  150 mL Per Tube Q6H   insulin aspart  0-20 Units Subcutaneous Q4H   insulin glargine-yfgn  32 Units Subcutaneous Daily   liothyronine  10 mcg Oral  Daily   mouth rinse  15 mL Mouth Rinse BID   metoprolol succinate  50 mg Oral Daily   multivitamin with minerals  1 tablet Oral Daily   nutrition supplement (JUVEN)  1 packet Oral BID BM   rivaroxaban  20 mg Oral Q supper   senna-docusate  2 tablet Oral BID   tamsulosin  0.4 mg Oral Daily   Continuous Infusions:   PRN Meds:.acetaminophen **OR** acetaminophen, bisacodyl, HYDROcodone-acetaminophen, melatonin, morphine injection   I have personally reviewed following labs and imaging studies  LABORATORY DATA: CBC: No results for input(s): "WBC", "NEUTROABS", "HGB", "HCT", "MCV", "PLT" in the last 168 hours.   Basic Metabolic Panel: Recent Labs  Lab 05/08/22 0103  NA 130*  K 4.4  CL 99  CO2 25  GLUCOSE 341*  BUN 29*  CREATININE 0.87  CALCIUM 9.4  PHOS 2.3*     GFR: Estimated Creatinine Clearance: 116.1 mL/min (by C-G formula based on SCr of 0.87 mg/dL).  Liver Function Tests: No results for input(s): "AST", "ALT", "ALKPHOS", "BILITOT", "PROT", "ALBUMIN" in the last 168 hours.  No results for input(s): "LIPASE", "AMYLASE" in the last 168 hours. No results for input(s): "AMMONIA" in the last 168 hours.  Coagulation Profile: No results for input(s): "INR", "PROTIME" in the last 168 hours.  Cardiac Enzymes: No results for input(s): "CKTOTAL", "CKMB", "CKMBINDEX",  "TROPONINI" in the last 168 hours.   BNP (last 3 results) No results for input(s): "PROBNP" in the last 8760 hours.  Lipid Profile: No results for input(s): "CHOL", "HDL", "LDLCALC", "TRIG", "CHOLHDL", "LDLDIRECT" in the last 72 hours.  Thyroid Function Tests: No results for input(s): "TSH", "T4TOTAL", "FREET4", "T3FREE", "THYROIDAB" in the last 72 hours.  Anemia Panel: No results for input(s): "VITAMINB12", "FOLATE", "FERRITIN", "TIBC", "IRON", "RETICCTPCT" in the last 72 hours.  Urine analysis:    Component Value Date/Time   COLORURINE YELLOW 04/11/2022 0540   APPEARANCEUR HAZY (A) 04/11/2022 0540   APPEARANCEUR Cloudy 08/01/2014 1614   LABSPEC 1.017 04/11/2022 0540   LABSPEC 1.008 08/01/2014 1614   PHURINE 7.0 04/11/2022 0540   GLUCOSEU >=500 (A) 04/11/2022 0540   GLUCOSEU Negative 08/01/2014 1614   HGBUR NEGATIVE 04/11/2022 0540   BILIRUBINUR NEGATIVE 04/11/2022 0540   BILIRUBINUR Negative 08/01/2014 1614   KETONESUR NEGATIVE 04/11/2022 0540   PROTEINUR NEGATIVE 04/11/2022 0540   UROBILINOGEN 0.2 07/01/2014 1809   NITRITE NEGATIVE 04/11/2022 0540   LEUKOCYTESUR NEGATIVE 04/11/2022 0540   LEUKOCYTESUR Negative 08/01/2014 1614    Sepsis Labs: Lactic Acid, Venous    Component Value Date/Time   LATICACIDVEN 1.8 04/11/2022 0651    MICROBIOLOGY: No results found for this or any previous visit (from the past 240 hour(s)).   RADIOLOGY STUDIES/RESULTS: No results found.   LOS: 42 days   Oren Binet, MD  [facility]    To contact the attending provider between 7A-7P or the covering provider during after hours 7P-7A, please log into the web site [email or web address] and access using universal [place] password for that web site. If you do not have the password, please call the hospital operator.  05/12/2022, 10:13 AM

## 2022-05-12 NOTE — Plan of Care (Signed)

## 2022-05-12 NOTE — Consult Note (Signed)
WOC Nurse wound follow up Patient receiving care in [facility] 2W11. Primary RN present to assist with positioning for wound care. 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 No odor detected today. Periwound: intact Dressing procedure/placement/frequency: Due to the proximity to the anus and rectum by the 7 cm tunnel, I decided to stop the [facility] therapy for fear of creating a rectocutaneous fistula. No evidence of that at this point in time. Also, stool had gotten drawn into the [facility] dressing despite the use of barrier rings between the wound and the anus.  I have entered the following order: Tuck kerlix moistened with saline or sterile water into the sacral wound. Be sure to fill the area going towards the anus and left buttock. Cover with ABD pad, tape in place. Change twice daily and prn soilage.  Monitor the wound area(s) for worsening of condition such as: Signs/symptoms of infection,  Increase in size,  Development of or worsening of odor, Development of pain, or increased pain at the affected locations.  Notify the medical team if any of these develop. WOC nurse will not follow at this time.  Please re-consult the WOC team if needed.  Helmut Muster, RN, MSN, CWOCN, CNS-BC, pager [phone]

## 2022-05-13 DIAGNOSIS — R7881 Bacteremia: Secondary | ICD-10-CM | POA: Diagnosis not present

## 2022-05-13 DIAGNOSIS — L89152 Pressure ulcer of sacral region, stage 2: Secondary | ICD-10-CM | POA: Diagnosis not present

## 2022-05-13 DIAGNOSIS — R627 Adult failure to thrive: Secondary | ICD-10-CM | POA: Diagnosis not present

## 2022-05-13 DIAGNOSIS — R748 Abnormal levels of other serum enzymes: Secondary | ICD-10-CM | POA: Diagnosis not present

## 2022-05-13 LAB — BASIC METABOLIC PANEL
Anion gap: 6 (ref 5–15)
BUN: 47 mg/dL — ABNORMAL HIGH (ref 8–23)
CO2: 27 mmol/L (ref 22–32)
Calcium: 9.3 mg/dL (ref 8.9–10.3)
Chloride: 97 mmol/L — ABNORMAL LOW (ref 98–111)
Creatinine, Ser: 0.97 mg/dL (ref 0.61–1.24)
GFR, Estimated: >60 mL/min (ref >60)
Glucose, Bld: 190 mg/dL — ABNORMAL HIGH (ref 70–99)
Potassium: 4.7 mmol/L (ref 3.5–5.1)
Sodium: 130 mmol/L — ABNORMAL LOW (ref 135–145)

## 2022-05-13 LAB — CBC
HCT: 38.9 % — ABNORMAL LOW (ref 39.0–52.0)
Hemoglobin: 12.6 g/dL — ABNORMAL LOW (ref 13.0–17.0)
MCH: 28 pg (ref 26.0–34.0)
MCHC: 32.4 g/dL (ref 30.0–36.0)
MCV: 86.4 fL (ref 80.0–100.0)
Platelets: 295 10*3/uL (ref 150–400)
RBC: 4.5 MIL/uL (ref 4.22–5.81)
RDW: 16.4 % — ABNORMAL HIGH (ref 11.5–15.5)
WBC: 7.8 10*3/uL (ref 4.0–10.5)
nRBC: 0 % (ref 0.0–0.2)

## 2022-05-13 LAB — GLUCOSE, CAPILLARY
Glucose-Capillary: 155 mg/dL — ABNORMAL HIGH (ref 70–99)
Glucose-Capillary: 175 mg/dL — ABNORMAL HIGH (ref 70–99)
Glucose-Capillary: 190 mg/dL — ABNORMAL HIGH (ref 70–99)
Glucose-Capillary: 246 mg/dL — ABNORMAL HIGH (ref 70–99)
Glucose-Capillary: 249 mg/dL — ABNORMAL HIGH (ref 70–99)
Glucose-Capillary: 288 mg/dL — ABNORMAL HIGH (ref 70–99)

## 2022-05-13 LAB — PROTIME-INR
INR: 1.4 — ABNORMAL HIGH (ref 0.8–1.2)
Prothrombin Time: 17.3 seconds — ABNORMAL HIGH (ref 11.4–15.2)

## 2022-05-13 NOTE — Progress Notes (Signed)
Calorie Count Note  48 - hour calorie count ordered.  Diet: Regular Supplements: Ensure Enlive - BID  Day 2 Results 6/14 Lunch: 384 kcal, 10 gm protein 6/14 Dinner: 0 kcal, 0 gm protein 6/15 Breakfast: 140 kcal, 2 gm protein  Supplements: 263 kcal, 15 gm protein  Total intake: 787 kcal (37% of minimum estimated needs)  27 protein (26% of minimum estimated needs)  Nutrition Diagnosis: Increased nutrient needs related to chronic illness, wound healing as evidenced by estimated needs.  Goal: Patient will meet greater than or equal to 90% of their needs.  Intervention:  Ensure Enlive - BID Juven - BID Multivitamin w/ minerals daily Nocturnal Tube Feeds via Cortrak   Kirby Crigler RD, LDN Clinical Dietitian See [facility] for contact information.

## 2022-05-13 NOTE — Progress Notes (Signed)
PROGRESS NOTE        PATIENT DETAILS Name: Christian [name] Age: 71 y.o. Sex: male Date of Birth: 02-May-1951 Admit Date: 03/30/2022 Admitting Physician Eduard Clos, MD PCP:Sun, Charise Carwin, MD  Brief Summary: Patient is a 71 y.o.  male with a history of A-fib on Xarelto, HFpEF, PAD, DM-2, bipolar disorder-presented with generalized weakness/sacral decubitus ulcer-was found to have hypoglycemia, hyperkalemia and subsequently admitted to the hospitalist service-see below for further details.  Thought to have extrapyramidal symptoms-evaluated by psych/neurology-psych medications adjusted with clinical improvement.  Unfortunately-further hospital course complicated by development of fever-due to probable Streptococcus bacteremia and infected decubitus ulcer.  See below for further details.  Significant events: 5/2>> admit to [facility] for weakness/hypoglycemia/hyperkalemia/rigidity-thought to have EPS 5/14>> febrile/tachycardic-Per nursing staff-discharge from prior existing sacral decubitus ulcer.  Started on broad-spectrum IV antibiotics.  Prelim blood culture positive for Streptococcus species. 5/19>> wound care starting hydrotherapy 6/05>>Cortak tube placed 6/07>>wound vac placed 6/14>> wound VAC removed   Significant studies: 5/3>> x-ray pelvis: No fracture. 5/3>> CT head: No acute intracranial findings. 5/3>> x-ray left shoulder: Rotator cuff impingement 5/14>> CXR:No Obvious PNA 5/15>> left upper extremity Doppler: No DVT. 5/16>> CT abdomen/pelvis: Inferior sacral decubitus ulceration-no evidence of osteomyelitis. 5/16>> Echo: EF 45-50%, no obvious vegetations.  Significant microbiology data: 5/14>> blood culture: Streptococcus constellatus  5/15>> blood culture: No growth  Procedures:   Consults: Neurology, psychiatry, infectious disease,CCS  Subjective: Per nursing staff-not eating much.  Had some loose stools yesterday that have since  improved-likely related to tube feeds.  Objective: Vitals: Blood pressure 121/79, pulse (!) 110, temperature 98.8 F (37.1 C), temperature source Oral, resp. rate 18, height 6' (1.829 m), weight 136.1 kg, SpO2 97 %.   Exam: Gen Exam:Alert awake-not in any distress HEENT:atraumatic, normocephalic Chest: B/L clear to auscultation anteriorly CVS:S1S2 regular Abdomen:soft non tender, non distended Extremities: Trace edema Neurology: Non focal but with generalized weakness   Pertinent Labs/Radiology:    Latest Ref Rng & Units 05/13/2022    3:29 AM 05/05/2022    6:16 AM 05/02/2022    2:00 AM  CBC  WBC 4.0 - 10.5 K/uL 7.8  4.4  5.4   Hemoglobin 13.0 - 17.0 g/dL 01.0  27.2  53.6   Hematocrit 39.0 - 52.0 % 38.9  39.9  39.9   Platelets 150 - 400 K/uL 295  244  291     Lab Results  Component Value Date   NA 130 (L) 05/13/2022   K 4.7 05/13/2022   CL 97 (L) 05/13/2022   CO2 27 05/13/2022      Assessment/Plan: Rigidity/tremors/mildly elevated CK: Felt to be more of extrapyramidal syndrome rather than NMS at this point.  Managed with supportive care.  After neurology/psych evaluation-has been resumed on Abilify and Cogentin-Wellbutrin/Seroquel has been discontinued.  AKI on CKD stage IIIa: AKI likely hemodynamically mediated-improved-creatinine close to baseline.   Mild rhabdomyolysis: Nontraumatic-improved with supportive care.  Sepsis due to possible strep bacteremia-sacral decubitus ulcer with abscess:Sepsis physiology has resolved-has completed a course of antimicrobial therapy-completed hydrotherapy-wound care placed wound VAC on 6/7.    Acute metabolic encephalopathy: Due to sepsis-has resolved-relatively awake and alert.    Hyperkalemia: resolved  Hypoglycemia: Resolved with supportive care.  Do not resume Amaryl on discharge.  DM-2 (A1c 7.6 on 4/15): CBG now stable-continue Semglee 32 units and resistant SSI.  Watch closely.-And adjust regimen  accordingly.  Recent Labs     05/13/22 0353 05/13/22 0712 05/13/22 1114  GLUCAP 190* 155* 288*     Hypothyroidism: Continue Cytomel.  PAF: Rate controlled-continue Xarelto  PAD: Follow-up with vascular surgery in the outpatient setting  HFpEF: Volume status stable-suspect has some amount of edema in lower extremities due to third spacing.  Demadex remains on hold.  Bipolar disorder: Given concerns for EPS-on Abilify and Cogentin-Depakote/Wellbutrin and Seroquel held.  Left shoulder pain: Supportive care-likely rotator cuff injury.  Stable to be followed up by orthopedics in the outpatient setting  Left arm swelling: Likely due to IVF infiltration-could have superficial venous thrombosis-left upper extremity Doppler negative for DVT.  Left arm swelling has resolved.  OSA: CPAP nightly  Acute urinary retention: Failed voiding trial on 6/5-continue Flomax-Foley catheter remains in place-we will plan on continuing Foley catheter to prevent soilage of sacral wound.  Debility/deconditioning: Has had worsening of his chronic debility during this hospitalization-plans are of SNF on discharge.  Per family-he has been nonambulatory since his discharge from [facility] in April 2023.  This likely resulted in decubitus ulcer that the patient presented with.  Failure to thrive syndrome/nutrition Status: core track tube in place due to poor oral intake.  Per nursing staff-not eating much-calorie count in progress-however does not look like he can sustain life with oral intake alone-consulting IR today for PEG tube placement.  He is on Xarelto-that will need to be held-depending on when IR can place PEG tube.  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 (Active)  03/31/22 1933  Location: Sacrum  Location Orientation: Mid  Staging: Stage 4 - Full thickness tissue loss with exposed bone, tendon or muscle.  Wound Description (Comments):   Present on Admission: Yes  Dressing Type Moist to dry;ABD 05/13/22 0900     Pressure Injury 05/08/22 Heel Right Deep Tissue Pressure Injury - Purple or maroon localized area of discolored intact skin or blood-filled blister due to damage of underlying soft tissue from pressure and/or shear. (Active)  05/08/22 1600  Location: Heel  Location Orientation: Right  Staging: Deep Tissue Pressure Injury - Purple or maroon localized area of discolored intact skin or blood-filled blister due to damage of underlying soft tissue from pressure and/or shear.  Wound Description (Comments):   Present on Admission: No  Dressing Type Foam - Lift dressing to assess site every shift 05/13/22 0900   Obesity: Estimated body mass index is 40.69 kg/m as calculated from the following:   Height as of this encounter: 6' (1.829 m).   Weight as of this encounter: 136.1 kg.   Code status:   Code Status: Full Code   DVT Prophylaxis: rivaroxaban (XARELTO) tablet 20 mg    Family Communication: [name]-[phone]-updated 6/15  Disposition Plan: Status is: Inpatient Remains inpatient appropriate because: Needs PEG tube placed-consulting IR on 6/15-ultimately to SNF hopefully sometime next week  Planned Discharge Destination:Skilled nursing facility    Diet: Diet Order             Diet regular Room service appropriate? No; Fluid consistency: Thin  Diet effective now                     Antimicrobial agents: Anti-infectives (From  admission, onward)    Start     Dose/Rate Route Frequency Ordered Stop   04/22/22 1015  linezolid (ZYVOX) tablet 600 mg        600 mg Oral Every 12 hours 04/22/22 0916 04/26/22 2110   04/15/22 0900  vancomycin (VANCOREADY) IVPB  1500 mg/300 mL  Status:  Discontinued        1,500 mg 150 mL/hr over 120 Minutes Intravenous Every 24 hours 04/14/22 0811 04/14/22 1005   04/14/22 2200  amoxicillin-clavulanate (AUGMENTIN) 875-125 MG per tablet 1 tablet        1 tablet Oral Every 12 hours 04/14/22 1331 04/23/22 2148   04/14/22 1100  DAPTOmycin (CUBICIN) 700 mg in sodium chloride 0.9 % IVPB  Status:  Discontinued        8 mg/kg  90.4 kg (Adjusted) 128 mL/hr over 30 Minutes Intravenous Daily 04/14/22 1005 04/22/22 0916   04/14/22 0900  vancomycin (VANCOREADY) IVPB 2000 mg/400 mL  Status:  Discontinued        2,000 mg 200 mL/hr over 120 Minutes Intravenous  Once 04/14/22 0811 04/14/22 1005   04/13/22 2200  metroNIDAZOLE (FLAGYL) tablet 500 mg  Status:  Discontinued        500 mg Oral Every 12 hours 04/13/22 1436 04/14/22 1331   04/12/22 0815  cefTRIAXone (ROCEPHIN) 2 g in sodium chloride 0.9 % 100 mL IVPB  Status:  Discontinued        2 g 200 mL/hr over 30 Minutes Intravenous Daily 04/12/22 0717 04/14/22 0811   04/12/22 0600  vancomycin (VANCOREADY) IVPB 1250 mg/250 mL  Status:  Discontinued        1,250 mg 166.7 mL/hr over 90 Minutes Intravenous Every 24 hours 04/11/22 0521 04/12/22 0717   04/11/22 1145  metroNIDAZOLE (FLAGYL) IVPB 500 mg  Status:  Discontinued        500 mg 100 mL/hr over 60 Minutes Intravenous Every 12 hours 04/11/22 1055 04/13/22 1436   04/11/22 0600  ceFEPIme (MAXIPIME) 2 g in sodium chloride 0.9 % 100 mL IVPB  Status:  Discontinued        2 g 200 mL/hr over 30 Minutes Intravenous Every 8 hours 04/11/22 0437 04/12/22 0717   04/11/22 0530  vancomycin (VANCOREADY) IVPB 2000 mg/400 mL        2,000 mg 200 mL/hr over 120 Minutes Intravenous  Once 04/11/22 0437 04/11/22 2008        MEDICATIONS: Scheduled Meds:  ARIPiprazole  10 mg Oral q morning   atorvastatin  10 mg Oral Daily   benztropine  1 mg Oral BID   Chlorhexidine Gluconate Cloth  6 each Topical Daily   diclofenac Sodium  2 g Topical QID    feeding supplement  237 mL Oral BID BM   feeding supplement (GLUCERNA 1.5 CAL)  1,000 mL Per Tube Q24H   feeding supplement (PROSource TF)  45 mL Per Tube TID   free water  150 mL Per Tube Q6H   insulin aspart  0-20 Units Subcutaneous Q4H   insulin glargine-yfgn  32 Units Subcutaneous Daily   liothyronine  10 mcg Oral Daily   mouth rinse  15 mL Mouth Rinse BID   metoprolol succinate  50 mg Oral Daily   multivitamin with minerals  1 tablet Oral Daily   nutrition supplement (JUVEN)  1 packet Oral BID BM   rivaroxaban  20 mg Oral Q supper   senna-docusate  2 tablet Oral BID   tamsulosin  0.4 mg Oral Daily  Continuous Infusions:   PRN Meds:.acetaminophen **OR** acetaminophen, bisacodyl, HYDROcodone-acetaminophen, loperamide, melatonin, morphine injection   I have personally reviewed following labs and imaging studies  LABORATORY DATA: CBC: Recent Labs  Lab 05/13/22 0329  WBC 7.8  HGB 12.6*  HCT 38.9*  MCV 86.4  PLT 295     Basic Metabolic Panel: Recent Labs  Lab 05/08/22 0103 05/13/22 0329  NA 130* 130*  K 4.4 4.7  CL 99 97*  CO2 25 27  GLUCOSE 341* 190*  BUN 29* 47*  CREATININE 0.87 0.97  CALCIUM 9.4 9.3  PHOS 2.3*  --      GFR: Estimated Creatinine Clearance: 104.1 mL/min (by C-G formula based on SCr of 0.97 mg/dL).  Liver Function Tests: No results for input(s): "AST", "ALT", "ALKPHOS", "BILITOT", "PROT", "ALBUMIN" in the last 168 hours.  No results for input(s): "LIPASE", "AMYLASE" in the last 168 hours. No results for input(s): "AMMONIA" in the last 168 hours.  Coagulation Profile: Recent Labs  Lab 05/13/22 0329  INR 1.4*    Cardiac Enzymes: No results for input(s): "CKTOTAL", "CKMB", "CKMBINDEX", "TROPONINI" in the last 168 hours.   BNP (last 3 results) No results for input(s): "PROBNP" in the last 8760 hours.  Lipid Profile: No results for input(s): "CHOL", "HDL", "LDLCALC", "TRIG", "CHOLHDL", "LDLDIRECT" in the last 72  hours.  Thyroid Function Tests: No results for input(s): "TSH", "T4TOTAL", "FREET4", "T3FREE", "THYROIDAB" in the last 72 hours.  Anemia Panel: No results for input(s): "VITAMINB12", "FOLATE", "FERRITIN", "TIBC", "IRON", "RETICCTPCT" in the last 72 hours.  Urine analysis:    Component Value Date/Time   COLORURINE YELLOW 04/11/2022 0540   APPEARANCEUR HAZY (A) 04/11/2022 0540   APPEARANCEUR Cloudy 08/01/2014 1614   LABSPEC 1.017 04/11/2022 0540   LABSPEC 1.008 08/01/2014 1614   PHURINE 7.0 04/11/2022 0540   GLUCOSEU >=500 (A) 04/11/2022 0540   GLUCOSEU Negative 08/01/2014 1614   HGBUR NEGATIVE 04/11/2022 0540   BILIRUBINUR NEGATIVE 04/11/2022 0540   BILIRUBINUR Negative 08/01/2014 1614   KETONESUR NEGATIVE 04/11/2022 0540   PROTEINUR NEGATIVE 04/11/2022 0540   UROBILINOGEN 0.2 07/01/2014 1809   NITRITE NEGATIVE 04/11/2022 0540   LEUKOCYTESUR NEGATIVE 04/11/2022 0540   LEUKOCYTESUR Negative 08/01/2014 1614    Sepsis Labs: Lactic Acid, Venous    Component Value Date/Time   LATICACIDVEN 1.8 04/11/2022 0651    MICROBIOLOGY: No results found for this or any previous visit (from the past 240 hour(s)).   RADIOLOGY STUDIES/RESULTS: No results found.   LOS: 54 days   Oren Binet, MD  [facility]    To contact the attending provider between 7A-7P or the covering provider during after hours 7P-7A, please log into the web site [email or web address] and access using universal [place] password for that web site. If you do not have the password, please call the hospital operator.  05/13/2022, 11:48 AM

## 2022-05-14 DIAGNOSIS — E875 Hyperkalemia: Secondary | ICD-10-CM | POA: Diagnosis not present

## 2022-05-14 LAB — GLUCOSE, CAPILLARY
Glucose-Capillary: 139 mg/dL — ABNORMAL HIGH (ref 70–99)
Glucose-Capillary: 178 mg/dL — ABNORMAL HIGH (ref 70–99)
Glucose-Capillary: 198 mg/dL — ABNORMAL HIGH (ref 70–99)
Glucose-Capillary: 209 mg/dL — ABNORMAL HIGH (ref 70–99)
Glucose-Capillary: 216 mg/dL — ABNORMAL HIGH (ref 70–99)

## 2022-05-14 LAB — BASIC METABOLIC PANEL
Anion gap: 9 (ref 5–15)
BUN: 47 mg/dL — ABNORMAL HIGH (ref 8–23)
CO2: 27 mmol/L (ref 22–32)
Calcium: 9.8 mg/dL (ref 8.9–10.3)
Chloride: 99 mmol/L (ref 98–111)
Creatinine, Ser: 1.04 mg/dL (ref 0.61–1.24)
GFR, Estimated: >60 mL/min (ref >60)
Glucose, Bld: 191 mg/dL — ABNORMAL HIGH (ref 70–99)
Potassium: 5 mmol/L (ref 3.5–5.1)
Sodium: 135 mmol/L (ref 135–145)

## 2022-05-14 LAB — OSMOLALITY, URINE: Osmolality, Ur: 608 mOsm/kg (ref 300–900)

## 2022-05-14 LAB — SODIUM, URINE, RANDOM: Sodium, Ur: 27 mmol/L

## 2022-05-14 LAB — CREATININE, URINE, RANDOM: Creatinine, Urine: 68.71 mg/dL

## 2022-05-14 LAB — URIC ACID: Uric Acid, Serum: 6.6 mg/dL (ref 3.7–8.6)

## 2022-05-14 LAB — OSMOLALITY: Osmolality: 303 mOsm/kg — ABNORMAL HIGH (ref 275–295)

## 2022-05-14 MED ORDER — INSULIN GLARGINE-YFGN 100 UNIT/ML ~~LOC~~ SOLN
36.0000 [IU] | Freq: Every day | SUBCUTANEOUS | Status: DC
Start: 2022-05-14 — End: 2022-05-18
  Administered 2022-05-14 – 2022-05-16 (×3): 36 [IU] via SUBCUTANEOUS
  Filled 2022-05-14 (×5): qty 0.36

## 2022-05-14 MED ORDER — ENOXAPARIN SODIUM 150 MG/ML IJ SOSY
140.0000 mg | PREFILLED_SYRINGE | Freq: Two times a day (BID) | INTRAMUSCULAR | Status: AC
Start: 2022-05-14 — End: 2022-05-16
  Administered 2022-05-14 – 2022-05-16 (×4): 140 mg via SUBCUTANEOUS
  Filled 2022-05-14 (×4): qty 0.94

## 2022-05-14 MED ORDER — ISOSORBIDE MONONITRATE ER 30 MG PO TB24
30.0000 mg | ORAL_TABLET | Freq: Every day | ORAL | Status: DC
  Administered 2022-05-14 – 2022-05-20 (×6): 30 mg via ORAL
  Filled 2022-05-14 (×6): qty 1

## 2022-05-14 NOTE — TOC Progression Note (Addendum)
Transition of Care [facility]) - Progression Note    Patient Details  Name: Christian [name] MRN: [id number] Date of Birth: December 30, 1950  Transition of Care [facility]) CM/SW [place], RN Phone Number: 05/14/2022, 12:42 PM  Clinical Narrative:    CM met with the patient and family at the bedside to discuss transitions of care back to an accepting skilled nursing facility post-hospitalization.  The daughter is going to provide document stating she is the legal guardian to make healthcare decisions for the patient and I will place this documentation on the chart and update the attending physician and bedside nursing.  The patient's daughter, Christian [name] is the legal guardian for the patient.  Document was placed on the chart and the electronic copy is present in Epic at this time - both copies were printed and placed in hard chart.  The patient's daughter and spouse were at the bedside for this discussion. I called Christian [name], Admission director with [facility] facilities and she will review the patient's current wound needs to determine if they will be able to provide care and give firm bed offer for placement for the SNF next week.  The patient currently has a Cortrak and this will need to be discontinued prior to SNF placement.  The patient's daughter states that the patient gave verbal permission for PEG placement.  I will revise the FL2 to include the pending PEG placement and will fax the patient out in the hub for possible bed offers and discharge to a SNF facility next week for care if he is medically stable for discharge.  The patient's family is agreeable to SNF placement.  The family is hopeful for [facility] facility but is agreeable to fax in the hub for other facilities if [facility] is unable to offer a bed next week.  CM and MSW with DTP Team will continue to follow the patient for [facility] placement.   Expected Discharge Plan: [place] Barriers to Discharge:  Continued Medical Work up  Expected Discharge Plan and Services Expected Discharge Plan: [place] In-house Referral: Clinical Social Work Discharge Planning Services: CM Consult Post Acute Care Choice: [place] Living arrangements for the past 2 months:  [facility] in [place] prior to SNF)                                       Social Determinants of Health (SDOH) Interventions    Readmission Risk Interventions    05/14/2022   12:39 PM 04/13/2022    3:06 PM 04/13/2022   10:43 AM  Readmission Risk Prevention Plan  Transportation Screening Complete  Complete  PCP or Specialist Appt within 3-5 Days  Complete   HRI or [place]  Complete   Social Work Consult for [place] Planning/Counseling  Complete   Palliative Care Screening  Not Applicable   Medication Review [employer]) Complete Referral to Pharmacy Complete  PCP or Specialist appointment within 3-5 days of discharge Complete  Complete  HRI or Home Care Consult Complete  Complete  SW Recovery Care/Counseling Consult   Complete  Palliative Care Screening Not Applicable  Not Applicable  Skilled Nursing Facility Complete  Complete

## 2022-05-14 NOTE — Progress Notes (Signed)
  Request seen for gastrostomy tube placement.  Images reviewed by Dr.McCullough.  Placement could be potentially difficult secondary to patient's anatomy as the colon lies anterior to the stomach.  He would require contrast given via Cortrak the night before planned procedure.  Chart reviewed = Currently patient is refusing.  We will keep a check on the chart.  If and when patient is agreeable to proceed, we will hold Xarelto the day/night prior and day of the procedure and will order contrast to be given.  Evren Shankland S Kita Neace PA-C 05/14/2022 11:23 AM

## 2022-05-14 NOTE — [place] FL2 (Signed)
[place] MEDICAID FL2 LEVEL OF CARE SCREENING TOOL     IDENTIFICATION  Patient Name: Christian [name] Birthdate: May 08, 1951 Sex: male Admission Date (Current Location): 03/30/2022  [facility] and [place] Number:  Producer, television/film/video and Address:  [facility] [place], [address] [place], [place] [address]      Provider Number: [id number]  Attending Physician Name and Address:  Leroy Sea, MD  Relative Name and Phone Number:  Lanette Hampshire - Legal guardian - [phone]    Current Level of Care: Hospital Recommended Level of Care: Skilled Nursing Facility Prior Approval Number:    Date Approved/Denied:   PASRR Number: [id number] E, expires 05/19/22  Discharge Plan: SNF    Current Diagnoses: Patient Active Problem List   Diagnosis Date Noted   Hypoglycemia 03/12/2022   Cellulitis in diabetic foot (HCC) 01/26/2022   Diabetic ulcer of ankle (HCC) 01/26/2022   Severe sepsis without septic shock (HCC) 04/02/2021   Lactic acidosis 04/02/2021   Hyponatremia 04/02/2021   Hyperkalemia 04/02/2021   Chronic kidney disease, stage 3b (HCC) 04/02/2021   Fever 04/02/2021   Septic shock (HCC) 04/02/2021   Altered mental status    AKI (acute kidney injury) (HCC)    CVA (cerebral vascular accident) (HCC) 10/07/2020   Type 2 diabetes mellitus with stage 3 chronic kidney disease (HCC) 04/01/2020   CAD (coronary artery disease), native coronary artery 04/01/2020   NSTEMI (non-ST elevated myocardial infarction) (HCC) 03/31/2020   Obesity, Class III, BMI 40-49.9 (morbid obesity) (HCC) 03/31/2020   Aspiration pneumonia of both lower lobes due to gastric secretions (HCC) 03/21/2020   Sacral decubitus ulcer, stage II (HCC) 03/19/2020   Chronic diastolic CHF (congestive heart failure) (HCC) 03/17/2020   Atrial flutter, paroxysmal (HCC) 12/27/2019   Constipation 09/16/2018   Bipolar 1 disorder, depressed (HCC) 08/22/2018   Encephalopathy    Paroxysmal atrial  fibrillation (HCC) 06/18/2018   History of pulmonary embolism 06/17/2018   Generalized weakness 03/27/2018   Diabetes mellitus type 2 in nonobese (HCC) 03/27/2018   Pulmonary embolism (HCC) 03/27/2018   Fall    Laceration of eyebrow    Bipolar I disorder, most recent episode depressed, severe w psychosis (HCC) 12/22/2015   Bipolar affective disorder, depressed, severe, with psychotic behavior (HCC)    Bipolar I disorder, current or most recent episode depressed, with psychotic features (HCC)    Severe bipolar I disorder with depression (HCC)    Bipolar I disorder, most recent episode depressed, severe with psychotic features (HCC)    Overdose 09/11/2015   Acute respiratory failure with hypoxia (HCC) 09/05/2015   Elevated troponin 09/05/2015   Somnolence 09/05/2015   Bipolar depression (HCC) 09/05/2015   Acute bilateral deep vein thrombosis (DVT) of femoral veins (HCC) 09/05/2015   Bilateral pulmonary embolism (HCC) 09/02/2015   Labile hypertension    Pulmonary embolism with acute cor pulmonale (HCC)    Dyspnea    Orthostatic hypotension    Bipolar disorder, in partial remission, most recent episode depressed (HCC)    Syncope, near 08/31/2015   Bipolar and related disorder (HCC)    Bipolar affective disorder, depressed, mild (HCC) 08/28/2015   Pressure ulcer 07/30/2015   Protein-calorie malnutrition, severe (HCC) 07/30/2015   Dehydration 07/29/2015   Diabetes (HCC) 07/09/2015   UTI (urinary tract infection) 06/19/2014   Bacteremia 06/19/2014   Bipolar affective disorder, current episode manic with psychotic symptoms (HCC) 06/17/2014   Type 2 diabetes mellitus with complication, without long-term current use of insulin (HCC) 06/02/2014  Other and unspecified hyperlipidemia 06/02/2014   OSA on CPAP 06/02/2014   Bilateral lower extremity edema: chronic with venous stasis changes 06/02/2014   Gout 06/02/2014   Hypertension     Orientation RESPIRATION BLADDER Height & Weight      Self, Time, Place  Normal Indwelling catheter Weight: 136.1 kg Height:  6' (182.9 cm)  BEHAVIORAL SYMPTOMS/MOOD NEUROLOGICAL BOWEL NUTRITION STATUS      Continent Feeding tube, Diet, Supplemental (See Discharge Summary - Pending PEG placement)  AMBULATORY STATUS COMMUNICATION OF NEEDS Skin   Extensive Assist Verbally PU Stage and Appropriate Care, Other (Comment)                       Personal Care Assistance Level of Assistance  Bathing, Feeding, Dressing Bathing Assistance: Maximum assistance Feeding assistance: Limited assistance Dressing Assistance: Limited assistance     Functional Limitations Warehouse manager, Speech Sight Info: Impaired Hearing Info: Adequate Speech Info: Impaired    SPECIAL CARE FACTORS FREQUENCY  PT (By licensed PT), OT (By licensed OT)     PT Frequency: 5 x per week OT Frequency: 5 x per week            Contractures Contractures Info: Not present    Additional Factors Info  Code Status, Insulin Sliding Scale, Psychotropic, Allergies Code Status Info: Full Code Allergies Info: Amlodipine, Onglyza, Zyprexa, Ritalin Psychotropic Info: Abilify, Cogentin Insulin Sliding Scale Info: Insulin Resistant Sliding Scale orders - see Discharge Summary       Current Medications (05/14/2022):  This is the current hospital active medication list Current Facility-Administered Medications  Medication Dose Route Frequency Provider Last Rate Last Admin   acetaminophen (TYLENOL) tablet 650 mg  650 mg Oral Q6H PRN Eduard Clos, MD   650 mg at 05/05/22 1258   Or   acetaminophen (TYLENOL) suppository 650 mg  650 mg Rectal Q6H PRN Eduard Clos, MD   650 mg at 04/11/22 1747   ARIPiprazole (ABILIFY) tablet 10 mg  10 mg Oral q morning Cinderella, Margaret A   10 mg at 05/14/22 6294   atorvastatin (LIPITOR) tablet 10 mg  10 mg Oral Daily Leroy Sea, MD   10 mg at 05/14/22 0929   benztropine (COGENTIN) tablet 1 mg  1 mg Oral BID Elgergawy,  Leana Roe, MD   1 mg at 05/14/22 7654   bisacodyl (DULCOLAX) EC tablet 10 mg  10 mg Oral Daily PRN Elgergawy, Leana Roe, MD   10 mg at 05/11/22 6503   Chlorhexidine Gluconate Cloth 2 % PADS 6 each  6 each Topical Daily Leroy Sea, MD   6 each at 05/14/22 0930   diclofenac Sodium (VOLTAREN) 1 % topical gel 2 g  2 g Topical QID Maretta Bees, MD   2 g at 05/14/22 0929   feeding supplement (ENSURE ENLIVE / ENSURE PLUS) liquid 237 mL  237 mL Oral BID BM Ghimire, Shanker M, MD   237 mL at 05/14/22 0930   feeding supplement (GLUCERNA 1.5 CAL) liquid 1,000 mL  1,000 mL Per Tube Q24H Maretta Bees, MD   1,000 mL at 05/13/22 1702   feeding supplement (PROSource TF) liquid 45 mL  45 mL Per Tube TID Maretta Bees, MD   45 mL at 05/14/22 1234   free water 150 mL  150 mL Per Tube Q6H Maretta Bees, MD   150 mL at 05/14/22 1235   HYDROcodone-acetaminophen (NORCO/VICODIN) 5-325 MG per tablet 1  tablet  1 tablet Oral Q4H PRN Elgergawy, Leana Roe, MD   1 tablet at 05/12/22 0942   insulin aspart (novoLOG) injection 0-20 Units  0-20 Units Subcutaneous Q4H Maretta Bees, MD   4 Units at 05/14/22 1234   insulin glargine-yfgn (SEMGLEE) injection 36 Units  36 Units Subcutaneous Daily Leroy Sea, MD   36 Units at 05/14/22 1057   isosorbide mononitrate (IMDUR) 24 hr tablet 30 mg  30 mg Oral Daily Leroy Sea, MD   30 mg at 05/14/22 0929   liothyronine (CYTOMEL) tablet 10 mcg  10 mcg Oral Daily Eduard Clos, MD   10 mcg at 05/14/22 4097   loperamide (IMODIUM) capsule 2 mg  2 mg Oral PRN Maretta Bees, MD   2 mg at 05/13/22 0912   MEDLINE mouth rinse  15 mL Mouth Rinse BID Maretta Bees, MD   15 mL at 05/14/22 0930   melatonin tablet 10 mg  10 mg Oral QHS PRN Marinda Elk, MD   10 mg at 05/13/22 2124   metoprolol succinate (TOPROL-XL) 24 hr tablet 50 mg  50 mg Oral Daily Leroy Sea, MD   50 mg at 05/14/22 0929   morphine (PF) 2 MG/ML injection 2 mg  2 mg  Intravenous Daily PRN Leroy Sea, MD   2 mg at 05/10/22 0848   multivitamin with minerals tablet 1 tablet  1 tablet Oral Daily Elgergawy, Leana Roe, MD   1 tablet at 05/14/22 3532   nutrition supplement (JUVEN) (JUVEN) powder packet 1 packet  1 packet Oral BID BM Leroy Sea, MD   1 packet at 05/14/22 9924   rivaroxaban (XARELTO) tablet 20 mg  20 mg Oral Q supper Ilda Basset, [place]   20 mg at 05/13/22 1653   senna-docusate (Senokot-S) tablet 2 tablet  2 tablet Oral BID Elgergawy, Leana Roe, MD   2 tablet at 05/11/22 2310   tamsulosin (FLOMAX) capsule 0.4 mg  0.4 mg Oral Daily Lanae Boast, MD   0.4 mg at 05/14/22 2683     Discharge Medications: Please see discharge summary for a list of discharge medications.  Relevant Imaging Results:  Relevant Lab Results:   Additional Information SS#[id number]. Pfizer Covid-19 Vaccine Bivalent Booster 01/28/22, 12/03/20, 03/04/20, 02/12/20  Janae Bridgeman, RN

## 2022-05-14 NOTE — Progress Notes (Signed)
Physical Therapy Treatment Patient Details Name: Christian [name] MRN: [id number] DOB: Nov 10, 1951 Today's Date: 05/14/2022   History of Present Illness Patient is a 71 y.o.  male presented with generalized weakness/sacral decubitus ulcer-was found to have hypoglycemia, hyperkalemia. Thought to have extrapyramidal symptoms-evaluated by psych/neurology-psych medications adjusted with clinical improvement.  Hospital course complicated by development of fever-due to probable Streptococcus bacteremia and infected decubitus ulcer. 5/19: wound care starting hydrotherapy. 6/05: Cortak tube placed  6/07: wound vac placed, hydro DC. 6/14: wound vac removed. PMH: A-fib on Xarelto, HFpEF, PAD, DM-2, bipolar disorder.    PT Comments    PTA called into room to assist pt with bed mobility while dressing changed, pt afterward agreeable to limited bed-level session of therapeutic exercises for BLE strengthening. Pt c/o severe pain at wound site on tailbone and defers EOB/OOB mobiltiy, RN notified. Pt continues to benefit from PT services to progress toward functional mobility goals.    Recommendations for follow up therapy are one component of a multi-disciplinary discharge planning process, led by the attending physician.  Recommendations may be updated based on patient status, additional functional criteria and insurance authorization.  Follow Up Recommendations  PT at Long-term acute care hospital     Assistance Recommended at Discharge Frequent or constant Supervision/Assistance  Patient can return home with the following Two people to help with walking and/or transfers;Assistance with cooking/housework;Assist for transportation;Help with stairs or ramp for entrance;Two people to help with bathing/dressing/bathroom;Assistance with feeding;Direct supervision/assist for medications management;Direct supervision/assist for financial management   Equipment Recommendations  Other (comment) (mechanical lift,  likely hospital bed pending progress)    Recommendations for Other Services       Precautions / Restrictions Precautions Precautions: Fall Precaution Comments: monitor HR, cortrak (not on tube feeds currently per pt request), Stage IV decubitus ulcer and R heel deep tissue pressure injury Restrictions Weight Bearing Restrictions: No     Mobility  Bed Mobility Overal bed mobility: Needs Assistance Bed Mobility: Rolling Rolling: Max assist, +2 for safety/equipment         General bed mobility comments: multimodal cues and increased time to initiate perform rolling, modA to partial roll but needs maxA to reach full sidelying for RN (present in room) to re-dress decubitus ulcer. Pt using bed rails with hand over hand guidance to rail. Rolls x4 reps and then pt refusing EOB/OOB due to severe pain, RN notified.    Transfers Overall transfer level: Needs assistance                 General transfer comment: pt refusing to attempt due to severe pain    Ambulation/Gait                   Stairs             Wheelchair Mobility    Modified Rankin (Stroke Patients Only)       Balance Overall balance assessment: Needs assistance, History of Falls     Sitting balance - Comments: declined sitting on EOB                                    Cognition Arousal/Alertness: Lethargic Behavior During Therapy: Flat affect Overall Cognitive Status: Impaired/Different from baseline Area of Impairment: Attention, Following commands, Problem solving, Memory                   Current Attention Level: Sustained Memory:  Decreased short-term memory Following Commands: Follows one step commands with increased time     Problem Solving: Slow processing, Decreased initiation, Difficulty sequencing, Requires verbal cues, Requires tactile cues General Comments: Pt asked about what type of music he likes as per note behind bed, having music/TV on helps  with mood, 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 With pt limiting mobility progression due to pain, therapist asked if he still wants PT services in future to work on balance and strength for standing, he states yes. Pt closes eyes frequently during session.        Exercises General Exercises - Lower Extremity Ankle Circles/Pumps: AROM, Both, 10 reps, Sidelying Short Arc Quad: AROM, AAROM, Both, 10 reps, Supine Heel Slides: AAROM, Both, 10 reps, Supine    General Comments General comments (skin integrity, edema, etc.): Pt self-limiting due to pain and lethargy after dressing change, he needs mod cues for alertness and safety with drinking water, HOB left >30* when therapist left room as he had recently drank >1 glass of water at end of session and wanting to reduce risk of aspiration.      Pertinent Vitals/Pain Pain Assessment Pain Assessment: 0-10 Pain Score: 8  Pain Location: area around decubitus ulcer Pain Descriptors / Indicators: Grimacing, Discomfort, Sore, Sharp Pain Intervention(s): Limited activity within patient's tolerance, Monitored during session, Repositioned, Patient requesting pain meds-RN notified           PT Goals (current goals can now be found in the care plan section) Acute Rehab PT Goals Patient Stated Goal: less pain, to keep working on OOB mobility when pain controlled PT Goal Formulation: With patient Time For Goal Achievement: 05/12/22 (PT notified goals need update, planning to perform today) Progress towards PT goals: Progressing toward goals    Frequency    Min 2X/week      PT Plan Current plan remains appropriate       AM-PAC PT "6 Clicks" Mobility   Outcome Measure  Help needed turning from your back to your side while in a flat bed without using bedrails?: A Lot Help needed moving from lying on your back to sitting on the side of a  flat bed without using bedrails?: Total Help needed moving to and from a bed to a chair (including a wheelchair)?: Total Help needed standing up from a chair using your arms (e.g., wheelchair or bedside chair)?: Total Help needed to walk in hospital room?: Total Help needed climbing 3-5 steps with a railing? : Total 6 Click Score: 7    End of Session   Activity Tolerance: Patient limited by pain;Patient limited by lethargy Patient left: in bed;with call bell/phone within reach;with bed alarm set;Other (comment) (pressure relief boots on) Nurse Communication: Mobility status;Patient requests pain meds;Other (comment) (pt pain 8/10, need max supervision while drinking due to drowsiness) PT Visit Diagnosis: Unsteadiness on feet (R26.81);History of falling (Z91.81);Repeated falls (R29.6);Difficulty in walking, not elsewhere classified (R26.2)     Time: 7616-0737 PT Time Calculation (min) (ACUTE ONLY): 26 min  Charges:  $Therapeutic Exercise: 8-22 mins $Therapeutic Activity: 8-22 mins                     Nelida Mandarino P., PTA Acute Rehabilitation Services Secure Chat Preferred 9a-5:30pm Office: [phone]    Dorathy Kinsman [facility] 05/14/2022, 1:02 PM

## 2022-05-14 NOTE — Plan of Care (Signed)
  Problem: Education: Goal: Knowledge of General Education information will improve Description: Including pain rating scale, medication(s)/side effects and non-pharmacologic comfort measures Outcome: Progressing   Problem: Clinical Measurements: Goal: Ability to maintain clinical measurements within normal limits will improve Outcome: Progressing Goal: Will remain free from infection Outcome: Progressing Goal: Diagnostic test results will improve Outcome: Progressing Goal: Respiratory complications will improve Outcome: Progressing Goal: Cardiovascular complication will be avoided Outcome: Progressing   Problem: Activity: Goal: Risk for activity intolerance will decrease Outcome: Progressing   Problem: Nutrition: Goal: Adequate nutrition will be maintained Outcome: Progressing   Problem: Pain Managment: Goal: General experience of comfort will improve Outcome: Progressing   Problem: Safety: Goal: Ability to remain free from injury will improve Outcome: Progressing   Problem: Skin Integrity: Goal: Risk for impaired skin integrity will decrease Outcome: Progressing   Problem: Education: Goal: Ability to describe self-care measures that may prevent or decrease complications (Diabetes Survival Skills Education) will improve Outcome: Progressing Goal: Individualized Educational Video(s) Outcome: Progressing   Problem: Coping: Goal: Ability to adjust to condition or change in health will improve Outcome: Progressing   Problem: Fluid Volume: Goal: Ability to maintain a balanced intake and output will improve Outcome: Progressing   Problem: Health Behavior/Discharge Planning: Goal: Ability to identify and utilize available resources and services will improve Outcome: Progressing Goal: Ability to manage health-related needs will improve Outcome: Progressing   Problem: Metabolic: Goal: Ability to maintain appropriate glucose levels will improve Outcome: Progressing    Problem: Nutritional: Goal: Maintenance of adequate nutrition will improve Outcome: Progressing Goal: Progress toward achieving an optimal weight will improve Outcome: Progressing   Problem: Skin Integrity: Goal: Risk for impaired skin integrity will decrease Outcome: Progressing   Problem: Tissue Perfusion: Goal: Adequacy of tissue perfusion will improve Outcome: Progressing

## 2022-05-14 NOTE — Progress Notes (Signed)
PROGRESS NOTE        PATIENT DETAILS Name: Christian [name] Age: 71 y.o. Sex: male Date of Birth: 1951-04-06 Admit Date: 03/30/2022 Admitting Physician Rise Patience, MD PCP:Sun, Gari Crown, MD  Brief Summary: Patient is a 71 y.o.  male with a history of A-fib on Xarelto, HFpEF, PAD, DM-2, bipolar disorder-presented with generalized weakness/sacral decubitus ulcer-was found to have hypoglycemia, hyperkalemia and subsequently admitted to the hospitalist service-see below for further details.  Thought to have extrapyramidal symptoms-evaluated by psych/neurology-psych medications adjusted with clinical improvement.  Unfortunately-further hospital course complicated by development of fever-due to probable Streptococcus bacteremia and infected decubitus ulcer.  See below for further details.  Significant events: 5/2>> admit to [facility] for weakness/hypoglycemia/hyperkalemia/rigidity-thought to have EPS 5/14>> febrile/tachycardic-Per nursing staff-discharge from prior existing sacral decubitus ulcer.  Started on broad-spectrum IV antibiotics.  Prelim blood culture positive for Streptococcus species. 5/19>> wound care starting hydrotherapy 6/05>>Cortak tube placed 6/07>>wound vac placed 6/14>> wound VAC removed   Significant studies: 5/3>> x-ray pelvis: No fracture. 5/3>> CT head: No acute intracranial findings. 5/3>> x-ray left shoulder: Rotator cuff impingement 5/14>> CXR:No Obvious PNA 5/15>> left upper extremity Doppler: No DVT. 5/16>> CT abdomen/pelvis: Inferior sacral decubitus ulceration-no evidence of osteomyelitis. 5/16>> Echo: EF 45-50%, no obvious vegetations.  Significant microbiology data: 5/14>> blood culture: Streptococcus constellatus  5/15>> blood culture: No growth  Procedures:   Consults: Neurology, psychiatry, infectious disease,CCS  Subjective:  Patient in bed denies any headache chest or abdominal pain, he knows he is in the hospital, he  clear that he does not want to be fed via NG or PEG tube, he understands the consequences of not getting it.  Objective: Vitals: Blood pressure 104/60, pulse (!) 110, temperature 98.8 F (37.1 C), temperature source Oral, resp. rate 16, height 6' (1.829 m), weight 136.1 kg, SpO2 97 %.   Exam:  Awake Alert, No new F.N deficits, Normal affect, NG tube in place, diffuse weakness all over but worse in bilateral lower extremities, wearing surgical boots in both lower extremities [place].AT,PERRAL Supple Neck, No JVD,   Symmetrical Chest wall movement, Good air movement bilaterally, CTAB RRR,No Gallops, Rubs or new Murmurs,  +ve B.Sounds, Abd Soft, No tenderness,   No Cyanosis, Clubbing or edema   Assessment/Plan:  Rigidity/tremors/mildly elevated CK: Felt to be more of extrapyramidal syndrome rather than NMS at this point.  Managed with supportive care.  After neurology/psych evaluation-has been resumed on Abilify and Cogentin-Wellbutrin/Seroquel has been discontinued.  AKI on CKD stage IIIa: AKI likely hemodynamically mediated-improved-creatinine close to baseline.   Mild rhabdomyolysis: Nontraumatic-improved with supportive care.  Sepsis due to possible strep bacteremia-sacral decubitus ulcer with abscess:Sepsis physiology has resolved-has completed a course of antimicrobial therapy-completed hydrotherapy-wound care placed wound VAC on 6/7.    Failure to thrive syndrome/nutrition Status: Patient has appropriate nutrition when he is fed however still low on calorie counts, he currently has NG tube and there were plans to pick PEG tube, I discussed this with the patient who is vehemently against both, he realizes that if he gets malnourished he can die.  I discussed this with patient's POA and she told me that patient does not want feeding through the tube and she realizes that, she understands that we should not be forcing our well on patient and she will come and talk to the patient on 05/14/2022  and clarify it.  Acute metabolic encephalopathy: Due to sepsis-has resolved-relatively awake and alert.    Hyperkalemia: resolved  Hypothyroidism: Continue Cytomel.  PAF: Rate controlled-continue Xarelto  PAD: Follow-up with vascular surgery in the outpatient setting  HFpEF: Volume status stable-suspect has some amount of edema in lower extremities due to third spacing.  Demadex remains on hold.  Bipolar disorder: Given concerns for EPS-on Abilify and Cogentin-Depakote/Wellbutrin and Seroquel held.  Left shoulder pain: Supportive care-likely rotator cuff injury.  Stable to be followed up by orthopedics in the outpatient setting  Left arm swelling: Likely due to IVF infiltration-could have superficial venous thrombosis-left upper extremity Doppler negative for DVT.  Left arm swelling has resolved.  OSA: CPAP nightly  Acute urinary retention: Failed voiding trial on 6/5-continue Flomax-Foley catheter remains in place-we will plan on continuing Foley catheter to prevent soilage of sacral wound.  Debility/deconditioning: Has had worsening of his chronic debility during this hospitalization-plans are of SNF on discharge.  Per family-he has been nonambulatory since his discharge from [place] in April 2023.  This likely resulted in decubitus ulcer that the patient presented with.  Hypoglycemia: Resolved with supportive care.  Do not resume Amaryl on discharge.  DM-2 (A1c 7.6 on 4/15): CBG now stable-continue Semglee 32 units and resistant SSI.  Watch closely.-And adjust regimen accordingly.  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 (Active)  03/31/22 1933  Location: Sacrum  Location Orientation: Mid  Staging: Stage 4 - Full thickness tissue loss with exposed bone, tendon or muscle.  Wound Description (Comments):   Present on Admission: Yes  Dressing Type ABD;Moist to dry 05/13/22 2125     Pressure Injury 05/08/22 Heel Right Deep Tissue Pressure Injury - Purple or maroon localized area of discolored intact skin or blood-filled blister due to damage of underlying soft tissue from pressure and/or shear. (Active)  05/08/22 1600  Location: Heel  Location Orientation: Right  Staging: Deep Tissue Pressure Injury - Purple or maroon localized area of discolored intact skin or blood-filled blister due to damage of underlying soft tissue from pressure and/or shear.  Wound Description (Comments):   Present on Admission: No  Dressing Type Foam - Lift dressing to assess site every shift 05/13/22 2125   Obesity: Estimated body mass index is 40.69 kg/m as calculated from the following:   Height as of this encounter: 6' (1.829 m).   Weight as of this encounter: 136.1 kg.   Code status:   Code Status: Full Code   DVT Prophylaxis: rivaroxaban (XARELTO) tablet 20 mg    Family Communication: [name]-[phone]-updated 6/15  Disposition Plan: Status is: Inpatient Remains inpatient appropriate because: Needs PEG tube placed-consulting IR on 6/15-ultimately to SNF hopefully sometime next week  Planned Discharge Destination:Skilled nursing facility    Diet: Diet Order             Diet regular Room service appropriate? No; Fluid consistency: Thin  Diet effective now                     Antimicrobial agents: Anti-infectives (From admission, onward)    Start  Dose/Rate Route Frequency Ordered Stop   04/22/22 1015  linezolid (ZYVOX) tablet 600 mg        600 mg Oral Every 12 hours 04/22/22 0916 04/26/22 2110   04/15/22 0900   vancomycin (VANCOREADY) IVPB 1500 mg/300 mL  Status:  Discontinued        1,500 mg 150 mL/hr over 120 Minutes Intravenous Every 24 hours 04/14/22 0811 04/14/22 1005   04/14/22 2200  amoxicillin-clavulanate (AUGMENTIN) 875-125 MG per tablet 1 tablet        1 tablet Oral Every 12 hours 04/14/22 1331 04/23/22 2148   04/14/22 1100  DAPTOmycin (CUBICIN) 700 mg in sodium chloride 0.9 % IVPB  Status:  Discontinued        8 mg/kg  90.4 kg (Adjusted) 128 mL/hr over 30 Minutes Intravenous Daily 04/14/22 1005 04/22/22 0916   04/14/22 0900  vancomycin (VANCOREADY) IVPB 2000 mg/400 mL  Status:  Discontinued        2,000 mg 200 mL/hr over 120 Minutes Intravenous  Once 04/14/22 0811 04/14/22 1005   04/13/22 2200  metroNIDAZOLE (FLAGYL) tablet 500 mg  Status:  Discontinued        500 mg Oral Every 12 hours 04/13/22 1436 04/14/22 1331   04/12/22 0815  cefTRIAXone (ROCEPHIN) 2 g in sodium chloride 0.9 % 100 mL IVPB  Status:  Discontinued        2 g 200 mL/hr over 30 Minutes Intravenous Daily 04/12/22 0717 04/14/22 0811   04/12/22 0600  vancomycin (VANCOREADY) IVPB 1250 mg/250 mL  Status:  Discontinued        1,250 mg 166.7 mL/hr over 90 Minutes Intravenous Every 24 hours 04/11/22 0521 04/12/22 0717   04/11/22 1145  metroNIDAZOLE (FLAGYL) IVPB 500 mg  Status:  Discontinued        500 mg 100 mL/hr over 60 Minutes Intravenous Every 12 hours 04/11/22 1055 04/13/22 1436   04/11/22 0600  ceFEPIme (MAXIPIME) 2 g in sodium chloride 0.9 % 100 mL IVPB  Status:  Discontinued        2 g 200 mL/hr over 30 Minutes Intravenous Every 8 hours 04/11/22 0437 04/12/22 0717   04/11/22 0530  vancomycin (VANCOREADY) IVPB 2000 mg/400 mL        2,000 mg 200 mL/hr over 120 Minutes Intravenous  Once 04/11/22 0437 04/11/22 2008        MEDICATIONS: Scheduled Meds:  ARIPiprazole  10 mg Oral q morning   atorvastatin  10 mg Oral Daily   benztropine  1 mg Oral BID   Chlorhexidine Gluconate Cloth  6 each Topical Daily    diclofenac Sodium  2 g Topical QID   feeding supplement  237 mL Oral BID BM   feeding supplement (GLUCERNA 1.5 CAL)  1,000 mL Per Tube Q24H   feeding supplement (PROSource TF)  45 mL Per Tube TID   free water  150 mL Per Tube Q6H   insulin aspart  0-20 Units Subcutaneous Q4H   insulin glargine-yfgn  32 Units Subcutaneous Daily   isosorbide mononitrate  30 mg Oral Daily   liothyronine  10 mcg Oral Daily   mouth rinse  15 mL Mouth Rinse BID   metoprolol succinate  50 mg Oral Daily   multivitamin with minerals  1 tablet Oral Daily   nutrition supplement (JUVEN)  1 packet Oral BID BM   rivaroxaban  20 mg Oral Q supper   senna-docusate  2 tablet Oral BID   tamsulosin  0.4 mg Oral Daily  Continuous Infusions:   PRN Meds:.acetaminophen **OR** acetaminophen, bisacodyl, HYDROcodone-acetaminophen, loperamide, melatonin, morphine injection   I have personally reviewed following labs and imaging studies  LABORATORY DATA:  Recent Labs  Lab 05/13/22 0329  WBC 7.8  HGB 12.6*  HCT 38.9*  PLT 295  MCV 86.4  MCH 28.0  MCHC 32.4  RDW 16.4*    Recent Labs  Lab 05/08/22 0103 05/13/22 0329  NA 130* 130*  K 4.4 4.7  CL 99 97*  CO2 25 27  GLUCOSE 341* 190*  BUN 29* 47*  CREATININE 0.87 0.97  CALCIUM 9.4 9.3  PHOS 2.3*  --   INR  --  1.4*    RADIOLOGY STUDIES/RESULTS: No results found.   LOS: 44 days   Signature  Susa Raring M.D on 05/14/2022 at 9:19 AM   -  To page go to [email or web address]

## 2022-05-14 NOTE — Progress Notes (Signed)
ANTICOAGULATION CONSULT NOTE - Initial Consult  Pharmacy Consult for Lovenox Indication: atrial fibrillation  Allergies  Allergen Reactions   Amlodipine Swelling and Other (See Comments)    Legs became swollen   Onglyza [Saxagliptin] Other (See Comments)    "Allergic," per [facility]   Zyprexa [Olanzapine] Other (See Comments)    Allergy noted by ACT Team. No reaction specified (??)- "Allergic," per [facility]   Ritalin [Methylphenidate] Anxiety and Other (See Comments)    "[place]"    Patient Measurements: Height: 6' (182.9 cm) Weight: 136.1 kg (300 lb) IBW/kg (Calculated) : 77.6 Heparin Dosing Weight:   Vital Signs: Temp: 97.6 F (36.4 C) (06/16 1330) Temp Source: Oral (06/16 1330) BP: 107/69 (06/16 1330) Pulse Rate: 105 (06/16 1330)  Labs: Recent Labs    05/13/22 0329 05/14/22 0652  HGB 12.6*  --   HCT 38.9*  --   PLT 295  --   LABPROT 17.3*  --   INR 1.4*  --   CREATININE 0.97 1.04    Estimated Creatinine Clearance: 97.1 mL/min (by C-G formula based on SCr of 1.04 mg/dL).   Medical History: Past Medical History:  Diagnosis Date   Bilateral lower extremity edema: chronic with venous stasis changes 06/02/2014   Bipolar 1 disorder (HCC)    Bipolar disorder (HCC)    CHF (congestive heart failure) (HCC)    Chronic kidney disease    Gout    Gout    Hx of blood clots    Hypertension    Mood swings    OSA on CPAP 06/02/2014   Other and unspecified hyperlipidemia 06/02/2014   Type II or unspecified type diabetes mellitus with unspecified complication, uncontrolled 06/02/2014    Medications:  Medications Prior to Admission  Medication Sig Dispense Refill Last Dose   acetaminophen (TYLENOL) 500 MG tablet Take 1,000 mg by mouth daily as needed (pain.).   unk   ARIPiprazole (ABILIFY) 20 MG tablet Take 1 tablet (20 mg total) by mouth daily. 30 tablet 0 03/29/2022   atorvastatin (LIPITOR) 10 MG tablet Take 1 tablet (10 mg total) by mouth daily at 6 PM.  (Patient taking differently: Take 10 mg by mouth every evening.) 30 tablet 0 03/29/2022   buPROPion (WELLBUTRIN) 75 MG tablet Take 1 tablet (75 mg total) by mouth daily. 30 tablet 0 03/30/2022   divalproex (DEPAKOTE ER) 500 MG 24 hr tablet Take 500 mg by mouth in the morning and at bedtime.   03/30/2022   empagliflozin (JARDIANCE) 10 MG TABS tablet Take 10 mg by mouth daily.    03/30/2022   glimepiride (AMARYL) 2 MG tablet Take 2 mg by mouth in the morning.   unk   insulin glargine (LANTUS) 100 UNIT/ML injection Inject 0.05 mLs (5 Units total) into the skin daily. 10 mL 3 03/29/2022   liothyronine (CYTOMEL) 5 MCG tablet Take 10 mcg by mouth daily.   03/30/2022   lisinopril (ZESTRIL) 20 MG tablet Take 20 mg by mouth daily.   unk   metoprolol succinate (TOPROL-XL) 50 MG 24 hr tablet Take 1 tablet (50 mg total) by mouth daily. Take with or immediately following a meal. (Patient taking differently: Take 50 mg by mouth daily.) 30 tablet 0 unk at unk   mirtazapine (REMERON) 15 MG tablet Take 15 mg by mouth at bedtime.   03/29/2022   polyethylene glycol powder (GLYCOLAX/MIRALAX) 17 GM/SCOOP powder Take 17 g by mouth daily. (Patient taking differently: Take 17 g by mouth daily as needed  for mild constipation.) 255 g 0 03/22/2022   QUEtiapine (SEROQUEL) 25 MG tablet Take 0.5 tablets (12.5 mg total) by mouth at bedtime. (Patient taking differently: Take 25 mg by mouth at bedtime.)   03/29/2022   rivaroxaban (XARELTO) 20 MG TABS tablet Take 1 tablet (20 mg total) by mouth daily with supper. (Patient taking differently: Take 20 mg by mouth daily.) 30 tablet 0 03/30/2022 at 1100   torsemide (DEMADEX) 20 MG tablet Take 1 tablet (20 mg total) by mouth daily.   03/30/2022   Vitamin D, Ergocalciferol, (DRISDOL) 1.25 MG (50000 UNIT) CAPS capsule Take 50,000 Units by mouth every 7 (seven) days.   03/24/2022   QUEtiapine (SEROQUEL) 50 MG tablet Take 50 mg by mouth at bedtime. (Patient not taking: Reported on 03/31/2022)   Not Taking    Scheduled:   ARIPiprazole  10 mg Oral q morning   atorvastatin  10 mg Oral Daily   benztropine  1 mg Oral BID   Chlorhexidine Gluconate Cloth  6 each Topical Daily   diclofenac Sodium  2 g Topical QID   enoxaparin (LOVENOX) injection  140 mg Subcutaneous BID   feeding supplement  237 mL Oral BID BM   feeding supplement (GLUCERNA 1.5 CAL)  1,000 mL Per Tube Q24H   feeding supplement (PROSource TF)  45 mL Per Tube TID   free water  150 mL Per Tube Q6H   insulin aspart  0-20 Units Subcutaneous Q4H   insulin glargine-yfgn  36 Units Subcutaneous Daily   isosorbide mononitrate  30 mg Oral Daily   liothyronine  10 mcg Oral Daily   mouth rinse  15 mL Mouth Rinse BID   metoprolol succinate  50 mg Oral Daily   multivitamin with minerals  1 tablet Oral Daily   nutrition supplement (JUVEN)  1 packet Oral BID BM   senna-docusate  2 tablet Oral BID   tamsulosin  0.4 mg Oral Daily    Assessment: Pt has been on xarelto for afib. Plan is for PEG placement on Monday. Dr. Thedore Mins would like to hold xarelto and bridge with Lovenox until Sunday morning.  Goal of Therapy:  Anti-Xa level 0.6-1 units/ml 4hrs after LMWH dose given Monitor platelets by anticoagulation protocol: Yes   Plan:  Hold Xarelto Lovenox 140mg  SQ q12 x4 doses F/u resume xarelto post PEG  , PharmD, BCIDP, AAHIVP, CPP Infectious Disease Pharmacist 05/14/2022 5:46 PM

## 2022-05-15 DIAGNOSIS — E875 Hyperkalemia: Secondary | ICD-10-CM | POA: Diagnosis not present

## 2022-05-15 LAB — COMPREHENSIVE METABOLIC PANEL
ALT: 63 U/L — ABNORMAL HIGH (ref 0–44)
AST: 50 U/L — ABNORMAL HIGH (ref 15–41)
Albumin: 2.1 g/dL — ABNORMAL LOW (ref 3.5–5.0)
Alkaline Phosphatase: 80 U/L (ref 38–126)
Anion gap: 8 (ref 5–15)
BUN: 52 mg/dL — ABNORMAL HIGH (ref 8–23)
CO2: 26 mmol/L (ref 22–32)
Calcium: 9.2 mg/dL (ref 8.9–10.3)
Chloride: 98 mmol/L (ref 98–111)
Creatinine, Ser: 1.11 mg/dL (ref 0.61–1.24)
GFR, Estimated: >60 mL/min (ref >60)
Glucose, Bld: 203 mg/dL — ABNORMAL HIGH (ref 70–99)
Potassium: 4.6 mmol/L (ref 3.5–5.1)
Sodium: 132 mmol/L — ABNORMAL LOW (ref 135–145)
Total Bilirubin: 0.3 mg/dL (ref 0.3–1.2)
Total Protein: 6.8 g/dL (ref 6.5–8.1)

## 2022-05-15 LAB — GLUCOSE, CAPILLARY
Glucose-Capillary: 128 mg/dL — ABNORMAL HIGH (ref 70–99)
Glucose-Capillary: 139 mg/dL — ABNORMAL HIGH (ref 70–99)
Glucose-Capillary: 170 mg/dL — ABNORMAL HIGH (ref 70–99)
Glucose-Capillary: 200 mg/dL — ABNORMAL HIGH (ref 70–99)
Glucose-Capillary: 202 mg/dL — ABNORMAL HIGH (ref 70–99)
Glucose-Capillary: 228 mg/dL — ABNORMAL HIGH (ref 70–99)
Glucose-Capillary: 241 mg/dL — ABNORMAL HIGH (ref 70–99)

## 2022-05-15 LAB — MAGNESIUM: Magnesium: 2.1 mg/dL (ref 1.7–2.4)

## 2022-05-15 NOTE — Progress Notes (Signed)
PROGRESS NOTE        PATIENT DETAILS Name: Christian [name] Age: 71 y.o. Sex: male Date of Birth: 07-10-1951 Admit Date: 03/30/2022 Admitting Physician Eduard Clos, MD PCP:Sun, Charise Carwin, MD  Brief Summary: Patient is a 72 y.o.  male with a history of A-fib on Xarelto, HFpEF, PAD, DM-2, bipolar disorder-presented with generalized weakness/sacral decubitus ulcer-was found to have hypoglycemia, hyperkalemia and subsequently admitted to the hospitalist service-see below for further details.  Thought to have extrapyramidal symptoms-evaluated by psych/neurology-psych medications adjusted with clinical improvement.  Unfortunately-further hospital course complicated by development of fever-due to probable Streptococcus bacteremia and infected decubitus ulcer.  See below for further details.  Significant events: 5/2>> admit to [facility] for weakness/hypoglycemia/hyperkalemia/rigidity-thought to have EPS 5/14>> febrile/tachycardic-Per nursing staff-discharge from prior existing sacral decubitus ulcer.  Started on broad-spectrum IV antibiotics.  Prelim blood culture positive for Streptococcus species. 5/19>> wound care starting hydrotherapy 6/05>>Cortak tube placed 6/07>>wound vac placed 6/14>> wound VAC removed   Significant studies: 5/3>> x-ray pelvis: No fracture. 5/3>> CT head: No acute intracranial findings. 5/3>> x-ray left shoulder: Rotator cuff impingement 5/14>> CXR:No Obvious PNA 5/15>> left upper extremity Doppler: No DVT. 5/16>> CT abdomen/pelvis: Inferior sacral decubitus ulceration-no evidence of osteomyelitis. 5/16>> Echo: EF 45-50%, no obvious vegetations.  Significant microbiology data: 5/14>> blood culture: Streptococcus constellatus  5/15>> blood culture: No growth  Procedures:   Consults: Neurology, psychiatry, infectious disease,CCS  Subjective:  Patient in bed denies any headache chest or abdominal pain, he knows he is in the hospital, he  clear that he does not want to be fed via NG or PEG tube, he understands the consequences of not getting it.  Objective: Vitals: Blood pressure 103/70, pulse (!) 104, temperature 98.4 F (36.9 C), temperature source Oral, resp. rate 17, height 6' (1.829 m), weight 136.1 kg, SpO2 95 %.   Exam:  Awake Alert, No new F.N deficits, Normal affect, NG tube in place, diffuse weakness all over but worse in bilateral lower extremities, wearing surgical boots in both lower extremities [place].AT,PERRAL Supple Neck, No JVD,   Symmetrical Chest wall movement, Good air movement bilaterally, CTAB RRR,No Gallops, Rubs or new Murmurs,  +ve B.Sounds, Abd Soft, No tenderness,   No Cyanosis, Clubbing or edema    Assessment/Plan:  Rigidity/tremors/mildly elevated CK: Felt to be more of extrapyramidal syndrome rather than NMS at this point.  Managed with supportive care.  After neurology/psych evaluation-has been resumed on Abilify and Cogentin-Wellbutrin/Seroquel has been discontinued.  AKI on CKD stage IIIa: AKI likely hemodynamically mediated-improved-creatinine close to baseline.   Mild rhabdomyolysis: Nontraumatic-improved with supportive care.  Sepsis due to possible strep bacteremia-sacral decubitus ulcer with abscess:Sepsis physiology has resolved-has completed a course of antimicrobial therapy-completed hydrotherapy-wound care placed wound VAC on 6/7.    Failure to thrive syndrome/with poor nutrition Status: Dietitian following the patient, he after talking to his sister has agreed for NG tube and PEG tube, IR to place likely on 05/17/2022.  Acute metabolic encephalopathy: Due to sepsis-has resolved-relatively awake and alert.    Hyperkalemia: resolved  Hypothyroidism: Continue Cytomel.  PAF: Rate controlled-continue Xarelto/Lovenox for pending PEG tube.  Last dose of Lovenox will be morning of 05/16/2022 in preparation of PEG tube on 05/17/2022  PAD: Follow-up with vascular surgery in the  outpatient setting  HFpEF: Volume status stable-suspect has some amount of edema in lower extremities due to  third spacing.  Demadex remains on hold.  Bipolar disorder: Given concerns for EPS-on Abilify and Cogentin-Depakote/Wellbutrin and Seroquel held.  Left shoulder pain: Supportive care-likely rotator cuff injury.  Stable to be followed up by orthopedics in the outpatient setting  Left arm swelling: Likely due to IVF infiltration-could have superficial venous thrombosis-left upper extremity Doppler negative for DVT.  Left arm swelling has resolved.  OSA: CPAP nightly  Acute urinary retention: Failed voiding trial on 6/5-continue Flomax-Foley catheter remains in place-we will plan on continuing Foley catheter to prevent soilage of sacral wound.  Debility/deconditioning: Has had worsening of his chronic debility during this hospitalization-plans are of SNF on discharge.  Per family-he has been nonambulatory since his discharge from [facility] in April 2023.  This likely resulted in decubitus ulcer that the patient presented with.  Hypoglycemia: Resolved with supportive care.  Do not resume Amaryl on discharge.  DM-2 (A1c 7.6 on 4/15): CBG now stable-continue Semglee 32 units and resistant SSI.  Watch closely and adjust regimen accordingly.  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 (Active)  03/31/22 1933  Location: Sacrum  Location Orientation: Mid  Staging: Stage 4 - Full thickness tissue loss with exposed bone, tendon or muscle.  Wound Description  (Comments):   Present on Admission: Yes  Dressing Type ABD;Moist to dry 05/14/22 2235     Pressure Injury 05/08/22 Heel Right Deep Tissue Pressure Injury - Purple or maroon localized area of discolored intact skin or blood-filled blister due to damage of underlying soft tissue from pressure and/or shear. (Active)  05/08/22 1600  Location: Heel  Location Orientation: Right  Staging: Deep Tissue Pressure Injury - Purple or maroon localized area of discolored intact skin or blood-filled blister due to damage of underlying soft tissue from pressure and/or shear.  Wound Description (Comments):   Present on Admission: No  Dressing Type Foam - Lift dressing to assess site every shift 05/15/22 3536   Obesity: Estimated body mass index is 40.69 kg/m as calculated from the following:   Height as of this encounter: 6' (1.829 m).   Weight as of this encounter: 136.1 kg.   Code status:   Code Status: Full Code   DVT Prophylaxis:    Family Communication: [name]-updated 6/15  Disposition Plan: Status is: Inpatient Remains inpatient appropriate because: Needs PEG tube placed-consulting IR on 6/15-ultimately to SNF hopefully sometime next week  Planned Discharge Destination:Skilled nursing facility    Diet: Diet Order             Diet regular Room service appropriate? No; Fluid consistency: Thin  Diet effective now                    MEDICATIONS: Scheduled Meds:  ARIPiprazole  10 mg Oral q morning   atorvastatin  10 mg Oral Daily   benztropine  1 mg Oral BID   Chlorhexidine Gluconate Cloth  6 each Topical Daily   diclofenac Sodium  2 g Topical QID   enoxaparin (LOVENOX) injection  140 mg Subcutaneous BID   feeding supplement  237 mL Oral BID BM  feeding supplement (GLUCERNA 1.5 CAL)  1,000 mL Per Tube Q24H   feeding supplement (PROSource TF)  45 mL Per Tube TID   free water  150 mL Per Tube Q6H   insulin aspart  0-20 Units Subcutaneous Q4H   insulin  glargine-yfgn  36 Units Subcutaneous Daily   isosorbide mononitrate  30 mg Oral Daily   liothyronine  10 mcg Oral Daily   mouth rinse  15 mL Mouth Rinse BID   metoprolol succinate  50 mg Oral Daily   multivitamin with minerals  1 tablet Oral Daily   nutrition supplement (JUVEN)  1 packet Oral BID BM   senna-docusate  2 tablet Oral BID   tamsulosin  0.4 mg Oral Daily   Continuous Infusions:   PRN Meds:.acetaminophen **OR** acetaminophen, bisacodyl, HYDROcodone-acetaminophen, loperamide, melatonin, morphine injection   I have personally reviewed following labs and imaging studies  LABORATORY DATA:  Recent Labs  Lab 05/13/22 0329  WBC 7.8  HGB 12.6*  HCT 38.9*  PLT 295  MCV 86.4  MCH 28.0  MCHC 32.4  RDW 16.4*    Recent Labs  Lab 05/13/22 0329 05/14/22 0652 05/15/22 0209  NA 130* 135 132*  K 4.7 5.0 4.6  CL 97* 99 98  CO2 27 27 26   GLUCOSE 190* 191* 203*  BUN 47* 47* 52*  CREATININE 0.97 1.04 1.11  CALCIUM 9.3 9.8 9.2  AST  --   --  50*  ALT  --   --  63*  ALKPHOS  --   --  80  BILITOT  --   --  0.3  ALBUMIN  --   --  2.1*  MG  --   --  2.1  INR 1.4*  --   --     RADIOLOGY STUDIES/RESULTS: No results found.   LOS: 45 days   Signature  M.D on 05/15/2022 at 10:41 AM   -  To page go to [email or web address]

## 2022-05-16 DIAGNOSIS — E875 Hyperkalemia: Secondary | ICD-10-CM | POA: Diagnosis not present

## 2022-05-16 LAB — CBC
HCT: 36.5 % — ABNORMAL LOW (ref 39.0–52.0)
Hemoglobin: 11.8 g/dL — ABNORMAL LOW (ref 13.0–17.0)
MCH: 28 pg (ref 26.0–34.0)
MCHC: 32.3 g/dL (ref 30.0–36.0)
MCV: 86.7 fL (ref 80.0–100.0)
Platelets: 302 10*3/uL (ref 150–400)
RBC: 4.21 MIL/uL — ABNORMAL LOW (ref 4.22–5.81)
RDW: 16.1 % — ABNORMAL HIGH (ref 11.5–15.5)
WBC: 7.8 10*3/uL (ref 4.0–10.5)
nRBC: 0 % (ref 0.0–0.2)

## 2022-05-16 LAB — GLUCOSE, CAPILLARY
Glucose-Capillary: 136 mg/dL — ABNORMAL HIGH (ref 70–99)
Glucose-Capillary: 148 mg/dL — ABNORMAL HIGH (ref 70–99)
Glucose-Capillary: 148 mg/dL — ABNORMAL HIGH (ref 70–99)
Glucose-Capillary: 206 mg/dL — ABNORMAL HIGH (ref 70–99)
Glucose-Capillary: 209 mg/dL — ABNORMAL HIGH (ref 70–99)
Glucose-Capillary: 235 mg/dL — ABNORMAL HIGH (ref 70–99)

## 2022-05-16 LAB — BASIC METABOLIC PANEL
Anion gap: 8 (ref 5–15)
BUN: 48 mg/dL — ABNORMAL HIGH (ref 8–23)
CO2: 27 mmol/L (ref 22–32)
Calcium: 9.6 mg/dL (ref 8.9–10.3)
Chloride: 99 mmol/L (ref 98–111)
Creatinine, Ser: 1.03 mg/dL (ref 0.61–1.24)
GFR, Estimated: >60 mL/min (ref >60)
Glucose, Bld: 196 mg/dL — ABNORMAL HIGH (ref 70–99)
Potassium: 4.5 mmol/L (ref 3.5–5.1)
Sodium: 134 mmol/L — ABNORMAL LOW (ref 135–145)

## 2022-05-16 LAB — MAGNESIUM: Magnesium: 2 mg/dL (ref 1.7–2.4)

## 2022-05-16 NOTE — Progress Notes (Signed)
PROGRESS NOTE        PATIENT DETAILS Name: Christian [name] Age: 71 y.o. Sex: male Date of Birth: Apr 18, 1951 Admit Date: 03/30/2022 Admitting Physician Eduard Clos, MD PCP:Sun, Charise Carwin, MD  Brief Summary: Patient is a 71 y.o.  male with a history of A-fib on Xarelto, HFpEF, PAD, DM-2, bipolar disorder-presented with generalized weakness/sacral decubitus ulcer-was found to have hypoglycemia, hyperkalemia and subsequently admitted to the hospitalist service-see below for further details.  Thought to have extrapyramidal symptoms-evaluated by psych/neurology-psych medications adjusted with clinical improvement.  Unfortunately-further hospital course complicated by development of fever-due to probable Streptococcus bacteremia and infected decubitus ulcer.  See below for further details.  Significant events: 5/2>> admit to [facility] for weakness/hypoglycemia/hyperkalemia/rigidity-thought to have EPS 5/14>> febrile/tachycardic-Per nursing staff-discharge from prior existing sacral decubitus ulcer.  Started on broad-spectrum IV antibiotics.  Prelim blood culture positive for Streptococcus species. 5/19>> wound care starting hydrotherapy 6/05>>Cortak tube placed 6/07>>wound vac placed 6/14>> wound VAC removed   Significant studies: 5/3>> x-ray pelvis: No fracture. 5/3>> CT head: No acute intracranial findings. 5/3>> x-ray left shoulder: Rotator cuff impingement 5/14>> CXR:No Obvious PNA 5/15>> left upper extremity Doppler: No DVT. 5/16>> CT abdomen/pelvis: Inferior sacral decubitus ulceration-no evidence of osteomyelitis. 5/16>> Echo: EF 45-50%, no obvious vegetations.  Significant microbiology data: 5/14>> blood culture: Streptococcus constellatus  5/15>> blood culture: No growth  Procedures:   Consults: Neurology, psychiatry, infectious disease,CCS  Subjective:  Patient in bed, appears comfortable, denies any headache, no fever, no chest pain or pressure,  no shortness of breath , no abdominal pain. No new focal weakness.   Objective: Vitals: Blood pressure 116/76, pulse (!) 110, temperature 99.4 F (37.4 C), temperature source Oral, resp. rate 17, height 6' (1.829 m), weight 136.1 kg, SpO2 96 %.   Exam:  Awake Alert, No new F.N deficits, Normal affect, NG tube in place, diffuse weakness all over but worse in bilateral lower extremities, wearing surgical boots in both lower extremities [place].AT,PERRAL Supple Neck, No JVD,   Symmetrical Chest wall movement, Good air movement bilaterally, CTAB RRR,No Gallops, Rubs or new Murmurs,  +ve B.Sounds, Abd Soft, No tenderness,   No Cyanosis, Clubbing or edema     Assessment/Plan:  Rigidity/tremors/mildly elevated CK: Felt to be more of extrapyramidal syndrome rather than NMS at this point.  Managed with supportive care.  After neurology/psych evaluation-has been resumed on Abilify and Cogentin-Wellbutrin/Seroquel has been discontinued.  AKI on CKD stage IIIa: AKI likely hemodynamically mediated-improved-creatinine close to baseline.   Mild rhabdomyolysis: Nontraumatic-improved with supportive care.  Sepsis due to possible strep bacteremia-sacral decubitus ulcer with abscess:Sepsis physiology has resolved-has completed a course of antimicrobial therapy-completed hydrotherapy-wound care placed wound VAC on 05/05/22.    Failure to thrive syndrome/with poor nutrition Status: Dietitian following the patient, he after talking to his sister has agreed for NG tube and PEG tube, IR to place likely on 05/17/2022. IR informed again 05/15/22.  Acute metabolic encephalopathy: Due to sepsis-has resolved-relatively awake and alert.    Hyperkalemia: resolved  Hypothyroidism: Continue Cytomel.  PAF: Rate controlled-continue Xarelto/Lovenox for pending PEG tube.  Last dose of Lovenox will be morning of 05/16/2022 in preparation of PEG tube on 05/17/2022  PAD: Follow-up with vascular surgery in the outpatient  setting  HFpEF: Volume status stable-suspect has some amount of edema in lower extremities due to third spacing.  Demadex remains on hold.  Bipolar disorder: Given concerns for EPS-on Abilify and Cogentin-Depakote/Wellbutrin and Seroquel held.  Left shoulder pain: Supportive care-likely rotator cuff injury.  Stable to be followed up by orthopedics in the outpatient setting  Left arm swelling: Likely due to IVF infiltration-could have superficial venous thrombosis-left upper extremity Doppler negative for DVT.  Left arm swelling has resolved.  OSA: CPAP nightly  Acute urinary retention: Failed voiding trial on 6/5-continue Flomax-Foley catheter remains in place-we will plan on continuing Foley catheter to prevent soilage of sacral wound.  Debility/deconditioning: Has had worsening of his chronic debility during this hospitalization-plans are of SNF on discharge.  Per family-he has been nonambulatory since his discharge from [facility] in April 2023.  This likely resulted in decubitus ulcer that the patient presented with.  Hypoglycemia: Resolved with supportive care.  Do not resume Amaryl on discharge.  DM-2 (A1c 7.6 on 4/15): CBG now stable-continue Semglee 32 units and resistant SSI.  Watch closely and adjust regimen accordingly.  CBG (last 3)  Recent Labs    05/15/22 2036 05/16/22 0056 05/16/22 0507  GLUCAP 170* 235* 206*      Nutrition Problem: Increased nutrient needs Etiology: chronic illness, wound healing Signs/Symptoms: estimated needs Interventions: Ensure Enlive (each supplement provides 350kcal and 20 grams of protein), MVI, Juven, Tube feeding, Prostat  Pressure Ulcer: Pressure Injury 03/31/22 Sacrum Mid Stage 4 - Full thickness tissue loss with exposed bone, tendon or muscle. (Active)  03/31/22 1933  Location: Sacrum  Location Orientation: Mid  Staging: Stage 4 - Full thickness tissue loss with exposed bone, tendon or muscle.  Wound Description (Comments):    Present on Admission: Yes  Dressing Type ABD;Moist to dry 05/15/22 2150     Pressure Injury 05/08/22 Heel Right Deep Tissue Pressure Injury - Purple or maroon localized area of discolored intact skin or blood-filled blister due to damage of underlying soft tissue from pressure and/or shear. (Active)  05/08/22 1600  Location: Heel  Location Orientation: Right  Staging: Deep Tissue Pressure Injury - Purple or maroon localized area of discolored intact skin or blood-filled blister due to damage of underlying soft tissue from pressure and/or shear.  Wound Description (Comments):   Present on Admission: No  Dressing Type Foam - Lift dressing to assess site every shift 05/15/22 2150   Obesity: Estimated body mass index is 40.69 kg/m as calculated from the following:   Height as of this encounter: 6' (1.829 m).   Weight as of this encounter: 136.1 kg.   Code status:   Code Status: Full Code   DVT Prophylaxis:    Family Communication: [name]-[phone]-updated 6/15  Disposition Plan: Status is: Inpatient Remains inpatient appropriate because: Needs PEG tube placed-consulting IR on 6/15-ultimately to SNF hopefully sometime next week  Planned Discharge Destination:Skilled nursing facility    Diet: Diet Order             Diet regular Room service appropriate? No; Fluid consistency: Thin  Diet effective now                    MEDICATIONS: Scheduled Meds:  ARIPiprazole  10 mg Oral q morning   atorvastatin  10 mg Oral Daily   benztropine  1 mg Oral BID   Chlorhexidine Gluconate Cloth  6 each Topical Daily   diclofenac Sodium  2 g Topical QID   enoxaparin (LOVENOX) injection  140 mg Subcutaneous BID   feeding supplement  237 mL Oral BID BM   feeding supplement (GLUCERNA 1.5 CAL)  1,000 mL Per Tube Q24H   feeding supplement (PROSource TF)  45 mL Per Tube TID   free water  150 mL Per Tube Q6H   insulin aspart  0-20 Units Subcutaneous Q4H   insulin glargine-yfgn   36 Units Subcutaneous Daily   isosorbide mononitrate  30 mg Oral Daily   liothyronine  10 mcg Oral Daily   mouth rinse  15 mL Mouth Rinse BID   metoprolol succinate  50 mg Oral Daily   multivitamin with minerals  1 tablet Oral Daily   nutrition supplement (JUVEN)  1 packet Oral BID BM   senna-docusate  2 tablet Oral BID   tamsulosin  0.4 mg Oral Daily   Continuous Infusions:   PRN Meds:.acetaminophen **OR** acetaminophen, bisacodyl, HYDROcodone-acetaminophen, loperamide, melatonin, morphine injection   I have personally reviewed following labs and imaging studies  LABORATORY DATA:  Recent Labs  Lab 05/13/22 0329 05/16/22 0353  WBC 7.8 7.8  HGB 12.6* 11.8*  HCT 38.9* 36.5*  PLT 295 302  MCV 86.4 86.7  MCH 28.0 28.0  MCHC 32.4 32.3  RDW 16.4* 16.1*    Recent Labs  Lab 05/13/22 0329 05/14/22 0652 05/15/22 0209 05/16/22 0353  NA 130* 135 132* 134*  K 4.7 5.0 4.6 4.5  CL 97* 99 98 99  CO2 27 27 26 27   GLUCOSE 190* 191* 203* 196*  BUN 47* 47* 52* 48*  CREATININE 0.97 1.04 1.11 1.03  CALCIUM 9.3 9.8 9.2 9.6  AST  --   --  50*  --   ALT  --   --  63*  --   ALKPHOS  --   --  80  --   BILITOT  --   --  0.3  --   ALBUMIN  --   --  2.1*  --   MG  --   --  2.1 2.0  INR 1.4*  --   --   --     RADIOLOGY STUDIES/RESULTS: No results found.   LOS: 46 days   Signature  M.D on 05/16/2022 at 8:29 AM   -  To page go to [email or web address]

## 2022-05-16 NOTE — Plan of Care (Signed)
  Problem: Education: Goal: Knowledge of General Education information will improve Description: Including pain rating scale, medication(s)/side effects and non-pharmacologic comfort measures Outcome: Progressing   Problem: Clinical Measurements: Goal: Ability to maintain clinical measurements within normal limits will improve Outcome: Progressing Goal: Will remain free from infection Outcome: Progressing   Problem: Activity: Goal: Risk for activity intolerance will decrease Outcome: Progressing   Problem: Pain Managment: Goal: General experience of comfort will improve Outcome: Progressing   Problem: Safety: Goal: Ability to remain free from injury will improve Outcome: Progressing   Problem: Skin Integrity: Goal: Risk for impaired skin integrity will decrease Outcome: Progressing   

## 2022-05-16 NOTE — Progress Notes (Signed)
Pt has plan in notes from providers to have peg tube placement on 6/19 clarified with nightshift provider no new orders to discontinue tube feeds or NPO status.. provider gives verbal order to continue tube feeds and meds and no NPO status at this time

## 2022-05-17 ENCOUNTER — Inpatient Hospital Stay (HOSPITAL_COMMUNITY): Payer: Medicare HMO

## 2022-05-17 DIAGNOSIS — E875 Hyperkalemia: Secondary | ICD-10-CM | POA: Diagnosis not present

## 2022-05-17 HISTORY — PX: IR GASTROSTOMY TUBE MOD SED: IMG625

## 2022-05-17 LAB — PROTIME-INR
INR: 1 (ref 0.8–1.2)
Prothrombin Time: 13.2 seconds (ref 11.4–15.2)

## 2022-05-17 LAB — CBC
HCT: 38.2 % — ABNORMAL LOW (ref 39.0–52.0)
Hemoglobin: 12.3 g/dL — ABNORMAL LOW (ref 13.0–17.0)
MCH: 28.1 pg (ref 26.0–34.0)
MCHC: 32.2 g/dL (ref 30.0–36.0)
MCV: 87.4 fL (ref 80.0–100.0)
Platelets: 278 10*3/uL (ref 150–400)
RBC: 4.37 MIL/uL (ref 4.22–5.81)
RDW: 15.8 % — ABNORMAL HIGH (ref 11.5–15.5)
WBC: 7.6 10*3/uL (ref 4.0–10.5)
nRBC: 0 % (ref 0.0–0.2)

## 2022-05-17 LAB — GLUCOSE, CAPILLARY
Glucose-Capillary: 112 mg/dL — ABNORMAL HIGH (ref 70–99)
Glucose-Capillary: 130 mg/dL — ABNORMAL HIGH (ref 70–99)
Glucose-Capillary: 141 mg/dL — ABNORMAL HIGH (ref 70–99)
Glucose-Capillary: 154 mg/dL — ABNORMAL HIGH (ref 70–99)
Glucose-Capillary: 173 mg/dL — ABNORMAL HIGH (ref 70–99)
Glucose-Capillary: 176 mg/dL — ABNORMAL HIGH (ref 70–99)
Glucose-Capillary: 210 mg/dL — ABNORMAL HIGH (ref 70–99)

## 2022-05-17 MED ORDER — LIDOCAINE VISCOUS HCL 2 % MT SOLN
OROMUCOSAL | Status: AC
  Filled 2022-05-17: qty 15

## 2022-05-17 MED ORDER — MIDAZOLAM HCL 2 MG/2ML IJ SOLN
INTRAMUSCULAR | Status: AC
  Filled 2022-05-17: qty 2

## 2022-05-17 MED ORDER — CEFAZOLIN SODIUM-DEXTROSE 2-4 GM/100ML-% IV SOLN
2.0000 g | INTRAVENOUS | Status: AC
Start: 2022-05-17 — End: 2022-05-17
  Filled 2022-05-17: qty 100

## 2022-05-17 MED ORDER — CEFAZOLIN SODIUM-DEXTROSE 2-4 GM/100ML-% IV SOLN
INTRAVENOUS | Status: AC
  Administered 2022-05-17: 2 g via INTRAVENOUS
  Filled 2022-05-17: qty 100

## 2022-05-17 MED ORDER — FENTANYL CITRATE (PF) 100 MCG/2ML IJ SOLN
INTRAMUSCULAR | Status: AC | PRN
  Administered 2022-05-17 (×2): 25 ug via INTRAVENOUS

## 2022-05-17 MED ORDER — LIDOCAINE HCL 1 % IJ SOLN
INTRAMUSCULAR | Status: AC
  Administered 2022-05-17: 10 mL
  Filled 2022-05-17: qty 20

## 2022-05-17 MED ORDER — FENTANYL CITRATE (PF) 100 MCG/2ML IJ SOLN
INTRAMUSCULAR | Status: AC
  Filled 2022-05-17: qty 2

## 2022-05-17 MED ORDER — GLUCAGON HCL RDNA (DIAGNOSTIC) 1 MG IJ SOLR
INTRAMUSCULAR | Status: AC | PRN
  Administered 2022-05-17: .5 mg via INTRAVENOUS

## 2022-05-17 MED ORDER — IOHEXOL 300 MG/ML  SOLN
100.0000 mL | Freq: Once | INTRAMUSCULAR | Status: AC | PRN
  Administered 2022-05-17: 15 mL

## 2022-05-17 MED ORDER — MIDAZOLAM HCL 2 MG/2ML IJ SOLN
INTRAMUSCULAR | Status: AC | PRN
  Administered 2022-05-17: .5 mg via INTRAVENOUS
  Administered 2022-05-17: 1 mg via INTRAVENOUS

## 2022-05-17 MED ORDER — DEXTROSE 5 % IV SOLN: INTRAVENOUS | Status: DC

## 2022-05-17 MED ORDER — DEXTROSE-NACL 5-0.9 % IV SOLN: INTRAVENOUS | Status: DC

## 2022-05-17 MED ORDER — GLUCAGON HCL RDNA (DIAGNOSTIC) 1 MG IJ SOLR
INTRAMUSCULAR | Status: AC
  Filled 2022-05-17: qty 1

## 2022-05-17 NOTE — Progress Notes (Signed)
Occupational Therapy Treatment Patient Details Name: Christian [name] MRN: [id number] DOB: 04/10/1951 Today's Date: 05/17/2022   History of present illness Patient is a 71 y.o.  male presented with generalized weakness/sacral decubitus ulcer-was found to have hypoglycemia, hyperkalemia. Thought to have extrapyramidal symptoms-evaluated by psych/neurology-psych medications adjusted with clinical improvement.  Hospital course complicated by development of fever-due to probable Streptococcus bacteremia and infected decubitus ulcer. 5/19: wound care starting hydrotherapy. 6/05: Cortak tube placed  6/07: wound vac placed, hydro DC. 6/14: wound vac removed. PMH: A-fib on Xarelto, HFpEF, PAD, DM-2, bipolar disorder.   OT comments  Patient received in supine and declined getting to EOB due to complaints of pain. Patient attempted to address rolling in bed and was too painful to completely roll to right or left. Patient led in BUE ROM exercises with AAROM for BUE shoulder flexion and abduction and AROM for BUE elbow flexion and extension. Patient performed light grooming with washing face and hands with supervision. Acute OT to continue to follow.    Recommendations for follow up therapy are one component of a multi-disciplinary discharge planning process, led by the attending physician.  Recommendations may be updated based on patient status, additional functional criteria and insurance authorization.    Follow Up Recommendations  Skilled nursing-short term rehab (<3 hours/day)    Assistance Recommended at Discharge Frequent or constant Supervision/Assistance  Patient can return home with the following  Two people to help with walking and/or transfers;Two people to help with bathing/dressing/bathroom;Assistance with feeding;Assistance with cooking/housework;Direct supervision/assist for medications management;Direct supervision/assist for financial management;Assist for transportation;Help with stairs or  ramp for entrance   Equipment Recommendations  Other (comment) (TBD)    Recommendations for Other Services      Precautions / Restrictions Precautions Precautions: Fall Precaution Comments: monitor HR, cortrak (not on tube feeds currently per pt request), Stage IV decubitus ulcer and R heel deep tissue pressure injury Restrictions Weight Bearing Restrictions: No       Mobility Bed Mobility Overal bed mobility: Needs Assistance Bed Mobility: Rolling Rolling: Max assist         General bed mobility comments: attemped rolling side to side with patient stating too painful at decubitus ulcer    Transfers Overall transfer level: Needs assistance                 General transfer comment: refused to get to EOB or transfers     Balance Overall balance assessment: Needs assistance, History of Falls     Sitting balance - Comments: declined sitting on EOB                                   ADL either performed or assessed with clinical judgement   ADL Overall ADL's : Needs assistance/impaired     Grooming: Wash/dry hands;Wash/dry face;Supervision/safety;Bed level Grooming Details (indicate cue type and reason): able to wash face and hands without assistance                               General ADL Comments: performed grooming at bed level    Extremity/Trunk Assessment              Vision       Perception     Praxis      Cognition Arousal/Alertness: Awake/alert Behavior During Therapy: Flat affect Overall Cognitive Status: Impaired/Different from baseline Area  of Impairment: Attention, Following commands, Problem solving, Memory                   Current Attention Level: Sustained Memory: Decreased short-term memory Following Commands: Follows one step commands with increased time     Problem Solving: Slow processing, Decreased initiation, Difficulty sequencing, Requires verbal cues, Requires tactile  cues General Comments: able to recall therapist from previous visit        Exercises Exercises: General Upper Extremity General Exercises - Upper Extremity Shoulder Flexion: AAROM, Both, 10 reps, Supine Shoulder ABduction: AAROM, Both, 10 reps, Supine Elbow Flexion: AROM, Both, 10 reps, Supine Elbow Extension: AROM, Both, 10 reps, Sidelying    Shoulder Instructions       General Comments      Pertinent Vitals/ Pain       Pain Assessment Pain Assessment: Faces Faces Pain Scale: Hurts a little bit Pain Location: area around decubitus ulcer Pain Descriptors / Indicators: Discomfort, Guarding, Grimacing Pain Intervention(s): Limited activity within patient's tolerance, Monitored during session  Home Living                                          Prior Functioning/Environment              Frequency  Min 2X/week        Progress Toward Goals  OT Goals(current goals can now be found in the care plan section)  Progress towards OT goals: Not progressing toward goals - comment (declining any activity other than bed level)  Acute Rehab OT Goals Patient Stated Goal: get better OT Goal Formulation: With patient Time For Goal Achievement: 05/20/22 Potential to Achieve Goals: Fair ADL Goals Pt Will Perform Eating: with min assist;sitting;bed level Pt Will Perform Grooming: with mod assist;bed level Pt Will Perform Lower Body Dressing: with modified independence;with adaptive equipment;sit to/from stand;sitting/lateral leans Pt Will Transfer to Toilet: with modified independence;regular height toilet;ambulating Pt Will Perform Toileting - Clothing Manipulation and hygiene: with modified independence;sit to/from stand;sitting/lateral leans Pt/caregiver will Perform Home Exercise Program: Increased ROM;Increased strength;Both right and left upper extremity;With minimal assist Additional ADL Goal #1: Pt will perform bed mobility with moderate assistance in  preparation for ADLs Additional ADL Goal #2: Pt will demonstrate fair sitting balance x 10 minutes at a precursor to ADLs.  Plan Discharge plan remains appropriate    Co-evaluation                 AM-PAC OT "6 Clicks" Daily Activity     Outcome Measure   Help from another person eating meals?: A Little Help from another person taking care of personal grooming?: A Lot Help from another person toileting, which includes using toliet, bedpan, or urinal?: Total Help from another person bathing (including washing, rinsing, drying)?: Total Help from another person to put on and taking off regular upper body clothing?: Total Help from another person to put on and taking off regular lower body clothing?: Total 6 Click Score: 9    End of Session    OT Visit Diagnosis: Muscle weakness (generalized) (M62.81);Pain   Activity Tolerance Patient limited by pain   Patient Left in bed;with call bell/phone within reach   Nurse Communication Mobility status        Time: 7106-2694 OT Time Calculation (min): 21 min  Charges: OT General Charges $OT Visit: 1 Visit OT Treatments $Therapeutic Exercise: 8-22  mins  Alfonse Flavors, OTA Acute Rehabilitation Services  Office [phone]   Dewain Penning 05/17/2022, 1:39 PM

## 2022-05-17 NOTE — Consult Note (Signed)
Chief Complaint: Patient was seen in consultation today for  Chief Complaint  Patient presents with   Weakness    Referring Physician(s): Dr. Thedore Mins  Supervising Physician: Ruel Favors  Patient Status: [facility] - In-pt  History of Present Illness: Christian [name] is a 71 y.o. male with a medical history significant for atrial fibrillation on Xarelto, heart failure, PAD, chronic kidney disease, DM2, depression and bipolar disorder. He presented to the [facility] ED 03/30/22 with altered mental status, unable to ambulate, feeling weak and with sacral decubitus ulcers. He was found to have a blood sugar of 30 and potassium of 6.5. The patient's physical exam showed rigidity and tremors and there was concern for extrapyramidal symptoms. He was admitted as a Code Sepsis.  He has demonstrated poor oral intake and a cortrak was placed 05/03/22. He continues to have failure to thrive. Interventional Radiology has been asked to evaluate this patient for an image-guided gastrostomy tube. Imaging reviewed and procedure approved by  Dr. Archer Asa.   Past Medical History:  Diagnosis Date   Bilateral lower extremity edema: chronic with venous stasis changes 06/02/2014   Bipolar 1 disorder (HCC)    Bipolar disorder (HCC)    CHF (congestive heart failure) (HCC)    Chronic kidney disease    Gout    Gout    Hx of blood clots    Hypertension    Mood swings    OSA on CPAP 06/02/2014   Other and unspecified hyperlipidemia 06/02/2014   Type II or unspecified type diabetes mellitus with unspecified complication, uncontrolled 06/02/2014    Past Surgical History:  Procedure Laterality Date   ABDOMINAL AORTOGRAM W/LOWER EXTREMITY N/A 09/16/2021   Procedure: ABDOMINAL AORTOGRAM W/LOWER EXTREMITY;  Surgeon: Victorino Sparrow, MD;  Location: [facility] INVASIVE CV LAB;  Service: Cardiovascular;  Laterality: N/A;   LEFT HEART CATH AND CORONARY ANGIOGRAPHY Left 04/01/2020   Procedure: LEFT HEART CATH AND CORONARY  ANGIOGRAPHY;  Surgeon: Laurier Nancy, MD;  Location: [facility] INVASIVE CV LAB;  Service: Cardiovascular;  Laterality: Left;   PERIPHERAL VASCULAR BALLOON ANGIOPLASTY Left 09/16/2021   Procedure: PERIPHERAL VASCULAR BALLOON ANGIOPLASTY;  Surgeon: Victorino Sparrow, MD;  Location: [facility] INVASIVE CV LAB;  Service: Cardiovascular;  Laterality: Left;  attempted to PTA posterior tibial artery; unable to cross lesion   TEE WITHOUT CARDIOVERSION N/A 04/21/2022   Procedure: TRANSESOPHAGEAL ECHOCARDIOGRAM (TEE);  Surgeon: [name], MD;  Location: [facility] ENDOSCOPY;  Service: Cardiovascular;  Laterality: N/A;    Allergies: Amlodipine, Onglyza [saxagliptin], Zyprexa [olanzapine], and Ritalin [methylphenidate]  Medications: Prior to Admission medications   Medication Sig Start Date End Date Taking? Authorizing Provider  acetaminophen (TYLENOL) 500 MG tablet Take 1,000 mg by mouth daily as needed (pain.).   Yes [provider]  ARIPiprazole (ABILIFY) 20 MG tablet Take 1 tablet (20 mg total) by mouth daily. 02/06/20  Yes Starkes-Perry, Juel Burrow, FNP  atorvastatin (LIPITOR) 10 MG tablet Take 1 tablet (10 mg total) by mouth daily at 6 PM. Patient taking differently: Take 10 mg by mouth every evening. 02/05/20  Yes Starkes-Perry, Juel Burrow, FNP  buPROPion (WELLBUTRIN) 75 MG tablet Take 1 tablet (75 mg total) by mouth daily. 02/06/20  Yes Starkes-Perry, Juel Burrow, FNP  divalproex (DEPAKOTE ER) 500 MG 24 hr tablet Take 500 mg by mouth in the morning and at bedtime.   Yes [provider]  empagliflozin (JARDIANCE) 10 MG TABS tablet Take 10 mg by mouth daily.  06/11/20  Yes [provider]  glimepiride (AMARYL) 2 MG tablet Take 2 mg by mouth in the morning.   Yes [provider]  insulin glargine (LANTUS) 100 UNIT/ML injection Inject 0.05 mLs (5 Units total) into the skin daily. 03/20/22  Yes Regalado, Belkys A, MD  liothyronine (CYTOMEL) 5 MCG tablet Take 10 mcg by mouth daily.   Yes  [provider]  lisinopril (ZESTRIL) 20 MG tablet Take 20 mg by mouth daily.   Yes [provider]  metoprolol succinate (TOPROL-XL) 50 MG 24 hr tablet Take 1 tablet (50 mg total) by mouth daily. Take with or immediately following a meal. Patient taking differently: Take 50 mg by mouth daily. 01/28/22 03/13/23 Yes Marinda Elk, MD  mirtazapine (REMERON) 15 MG tablet Take 15 mg by mouth at bedtime. 05/01/21  Yes [provider]  polyethylene glycol powder (GLYCOLAX/MIRALAX) 17 GM/SCOOP powder Take 17 g by mouth daily. Patient taking differently: Take 17 g by mouth daily as needed for mild constipation. 10/14/20  Yes Ghimire, Werner Lean, MD  QUEtiapine (SEROQUEL) 25 MG tablet Take 0.5 tablets (12.5 mg total) by mouth at bedtime. Patient taking differently: Take 25 mg by mouth at bedtime. 03/16/22  Yes Calvert Cantor, MD  rivaroxaban (XARELTO) 20 MG TABS tablet Take 1 tablet (20 mg total) by mouth daily with supper. Patient taking differently: Take 20 mg by mouth daily. 04/02/20  Yes Dhungel, Nishant, MD  torsemide (DEMADEX) 20 MG tablet Take 1 tablet (20 mg total) by mouth daily. 03/16/22  Yes Calvert Cantor, MD  Vitamin D, Ergocalciferol, (DRISDOL) 1.25 MG (50000 UNIT) CAPS capsule Take 50,000 Units by mouth every 7 (seven) days.   Yes [provider]  ARIPiprazole (ABILIFY) 10 MG tablet Take 1 tablet (10 mg total) by mouth every morning. 04/29/22   Lanae Boast, MD  benztropine (COGENTIN) 1 MG tablet Take 1 tablet (1 mg total) by mouth 2 (two) times daily. 04/29/22   Lanae Boast, MD  bisacodyl (DULCOLAX) 5 MG EC tablet Take 2 tablets (10 mg total) by mouth daily as needed for moderate constipation. 04/29/22   Lanae Boast, MD  feeding supplement (ENSURE ENLIVE / ENSURE PLUS) LIQD Take 237 mLs by mouth 3 (three) times daily between meals. 04/29/22   Lanae Boast, MD  HYDROcodone-acetaminophen (NORCO/VICODIN) 5-325 MG tablet Take 1 tablet by mouth every 6 (six) hours as needed for up  to 4 doses for moderate pain. 04/29/22   Lanae Boast, MD  insulin glargine-yfgn (SEMGLEE) 100 UNIT/ML injection Inject 0.1 mLs (10 Units total) into the skin daily. 04/29/22   Lanae Boast, MD  melatonin 10 MG TABS Take 10 mg by mouth at bedtime as needed (sleep). 04/29/22   Lanae Boast, MD  nutrition supplement, JUVEN, (JUVEN) PACK Take 1 packet by mouth 2 (two) times daily between meals. 04/29/22   Lanae Boast, MD  QUEtiapine (SEROQUEL) 50 MG tablet Take 50 mg by mouth at bedtime. Patient not taking: Reported on 03/31/2022    [provider]  senna-docusate (SENOKOT-S) 8.6-50 MG tablet Take 2 tablets by mouth 2 (two) times daily. 04/29/22   Lanae Boast, MD     Family History  Problem Relation Age of Onset   Dementia Mother    Arthritis Father    Hypertension Father    Mental illness Sister    Diabetes Sister    Heart failure Neg Hx    Kidney failure Neg Hx    Cancer Neg Hx     Social History   Socioeconomic History  Marital status: Divorced    Spouse name: Not on file   Number of children: Not on file   Years of education: Not on file   Highest education level: Not on file  Occupational History   Not on file  Tobacco Use   Smoking status: Never   Smokeless tobacco: Never  Vaping Use   Vaping Use: Never used  Substance and Sexual Activity   Alcohol use: No   Drug use: No   Sexual activity: Not Currently  Other Topics Concern   Not on file  Social History Narrative   *The patient is divorced and is currently on disability. He lives alone and has one daughter.             Social Determinants of Health   Financial Resource Strain: Not on file  Food Insecurity: Not on file  Transportation Needs: Not on file  Physical Activity: Not on file  Stress: Not on file  Social Connections: Not on file    Review of Systems: A 12 point ROS discussed and pertinent positives are indicated in the HPI above.  All other systems are negative.  Review of Systems  Constitutional:   Positive for appetite change and fatigue.  Musculoskeletal:  Positive for back pain.    Vital Signs: BP 115/79   Pulse (!) 106   Temp 99.8 F (37.7 C) (Oral)   Resp 18   Ht 6' (1.829 m)   Wt 300 lb (136.1 kg)   SpO2 95%   BMI 40.69 kg/m   Physical Exam Constitutional:      General: He is not in acute distress.    Appearance: He is not ill-appearing.  HENT:     Mouth/Throat:     Mouth: Mucous membranes are moist.     Pharynx: Oropharynx is clear.  Cardiovascular:     Rate and Rhythm: Normal rate and regular rhythm.     Pulses: Normal pulses.     Heart sounds: Normal heart sounds.  Pulmonary:     Effort: Pulmonary effort is normal.     Breath sounds: Normal breath sounds.  Abdominal:     General: Bowel sounds are normal.     Palpations: Abdomen is soft.  Skin:    General: Skin is warm and dry.  Neurological:     Mental Status: He is alert and oriented to person, place, and time.     Comments: Patient able to answer all questions appropriately. Slow to respond, hard to hear due to very soft voice.      Imaging: DG Abd Portable 1V  Result Date: 05/03/2022 CLINICAL DATA:  Feeding tube placement EXAM: PORTABLE ABDOMEN - 1 VIEW COMPARISON:  04/13/2022 FINDINGS: Limited radiograph of the upper abdomen was obtained for the purposes of enteric tube localization. Enteric tube is seen coursing below the diaphragm with distal tip terminating within the expected location of the gastric body. IMPRESSION: Enteric tube tip terminating within the gastric body. Electronically Signed   By: Davina Poke D.O.   On: 05/03/2022 16:30   ECHO TEE  Result Date: 04/21/2022    TRANSESOPHOGEAL ECHO REPORT   Patient Name:   Christian [name] Date of Exam: 04/21/2022 Medical Rec #:  [id number]        Height:       72.0 in Accession #:    [id number]       Weight:       242.1 lb Date of Birth:  [birth year]  BSA:          2.311 m Patient Age:    75 years         BP:           143/84 mmHg Patient  Gender: M                HR:           66 bpm. Exam Location:  Inpatient Procedure: 3D Echo, Transesophageal Echo, Cardiac Doppler and Color Doppler Indications:     Bacteremia  History:         Patient has prior history of Echocardiogram examinations, most                  recent 04/13/2022. Previous Myocardial Infarction, Abnormal ECG,                  Arrythmias:Atrial Flutter, Signs/Symptoms:Altered Mental                  Status, Syncope, Shortness of Breath and Dyspnea; Risk                  Factors:Sleep Apnea, Dyslipidemia and Hypertension. Shock. CKD.  Sonographer:     Roseanna Rainbow RDCS Referring Phys:  [id number] Margie Billet Diagnosing Phys: [name] MD PROCEDURE: After discussion of the risks and benefits of a TEE, an informed consent was obtained from the patient. The transesophogeal probe was passed without difficulty through the esophogus of the patient. Imaged were obtained with the patient in a left lateral decubitus position. Sedation performed by different physician. The patient was monitored while under deep sedation. Anesthestetic sedation was provided intravenously by Anesthesiology: 373mg  of Propofol, 100mg  of Lidocaine. The patient's vital signs; including heart rate, blood pressure, and oxygen saturation; remained stable throughout the procedure. The patient developed no complications during the procedure. IMPRESSIONS  1. Left ventricular ejection fraction, by estimation, is 50 to 55%. The left ventricle has low normal function.  2. Right ventricular systolic function is normal. The right ventricular size is normal.  3. Left atrial size was mildly dilated. No left atrial/left atrial appendage thrombus was detected.  4. The mitral valve is degenerative. Trivial mitral valve regurgitation.  5. The aortic valve is calcified. There is moderate calcification of the aortic valve. Aortic valve regurgitation is not visualized. Aortic valve sclerosis/calcification is present, without any  evidence of aortic stenosis. Conclusion(s)/Recommendation(s): No evidence of vegetation/infective endocarditis on this transesophageael echocardiogram. FINDINGS  Left Ventricle: Left ventricular ejection fraction, by estimation, is 50 to 55%. The left ventricle has low normal function. The left ventricular internal cavity size was normal in size. Right Ventricle: The right ventricular size is normal. No increase in right ventricular wall thickness. Right ventricular systolic function is normal. Left Atrium: Left atrial size was mildly dilated. No left atrial/left atrial appendage thrombus was detected. Right Atrium: Right atrial size was normal in size. Pericardium: Trivial pericardial effusion is present. Mitral Valve: The mitral valve is degenerative in appearance. Trivial mitral valve regurgitation. Tricuspid Valve: The tricuspid valve is normal in structure. Tricuspid valve regurgitation is trivial. Aortic Valve: The aortic valve is calcified. There is moderate calcification of the aortic valve. Aortic valve regurgitation is not visualized. Aortic valve sclerosis/calcification is present, without any evidence of aortic stenosis. Pulmonic Valve: The pulmonic valve was normal in structure. Pulmonic valve regurgitation is not visualized. Aorta: The aortic root is normal in size and structure. IAS/Shunts: No atrial level shunt detected by color flow  Doppler.   3D Volume EF LV 3D EDV:   119.81 ml LV 3D ESV:   61.66 ml Epifanio Lesches MD Electronically signed by Epifanio Lesches MD Signature Date/Time: 04/21/2022/3:19:22 PM    Final    CT CHEST W CONTRAST  Result Date: 04/19/2022 CLINICAL DATA:  A male age 71 presents for evaluation of bacteremia, confusion suspected abscess. EXAM: CT CHEST WITH CONTRAST TECHNIQUE: Multidetector CT imaging of the chest was performed during intravenous contrast administration. RADIATION DOSE REDUCTION: This exam was performed according to the departmental dose-optimization  program which includes automated exposure control, adjustment of the mA and/or kV according to patient size and/or use of iterative reconstruction technique. CONTRAST:  OMNIPAQUE IOHEXOL 300 MG/ML  SOLN COMPARISON:  Apr 02, 2021. FINDINGS: Cardiovascular: Extensive 3 vessel coronary artery calcification. Heart size top normal without pericardial effusion or signs of pericardial thickening. Aorta of normal caliber with calcified and noncalcified atheromatous plaque. Central pulmonary vasculature is unremarkable on venous phase. Mediastinum/Nodes: No thoracic inlet, axillary, mediastinal or hilar adenopathy. Esophagus grossly normal. Lungs/Pleura: Basilar atelectasis. Trace pleural fluid. Mild scarring at the RIGHT lung apex. No pneumothorax. Airways are patent. Upper Abdomen: Hepatic steatosis. Upper abdominal assessment limited by beam hardening artifact from hardware along the LEFT flank related to monitoring equipment and due to arm positioning. Suspect hepatic steatosis. No acute findings relative to pancreas, spleen, adrenal glands or kidneys. No acute gastrointestinal process. Musculoskeletal: No acute bone finding. No destructive bone process. Spinal degenerative changes. IMPRESSION: 1. Patchy basilar airspace disease likely atelectasis with signs of scarring in the LEFT upper lobe. No lobar consolidation. 2. Extensive 3 vessel coronary artery calcification. 3. Hepatic steatosis. 4. Aortic atherosclerosis. Aortic Atherosclerosis (ICD10-I70.0). Electronically Signed   By: Donzetta Kohut M.D.   On: 04/19/2022 08:23   CT HEAD W CONTRAST ( )  Result Date: 04/19/2022 CLINICAL DATA:  71 year old male with confusion and bacteremia. EXAM: CT HEAD WITH CONTRAST TECHNIQUE: Contiguous axial images were obtained from the base of the skull through the vertex with intravenous contrast. RADIATION DOSE REDUCTION: This exam was performed according to the departmental dose-optimization program which includes  automated exposure control, adjustment of the mA and/or kV according to patient size and/or use of iterative reconstruction technique. CONTRAST:  OMNIPAQUE IOHEXOL 300 MG/ML  SOLN COMPARISON:  Noncontrast head CT 03/31/2022.  Brain MRI 10/07/2020. FINDINGS: Brain: Postcontrast imaging only. Cerebral volume is stable. No midline shift, ventriculomegaly, mass effect, evidence of mass lesion, intracranial hemorrhage or evidence of cortically based acute infarction. Compared to 03/31/2022, No abnormal enhancement identified. Evidence of a small area of medial right occipital pole encephalomalacia, stable from recent CT (series 5, image 42 today). Elsewhere gray-white matter differentiation appears to remain normal. Vascular: Extensive Calcified atherosclerosis at the skull base. The major intracranial vascular structures appear to be enhancing, patent. Skull: No acute osseous abnormality identified. Sinuses/Orbits: Visualized paranasal sinuses and mastoids are stable and well aerated. Other: No acute orbit or scalp soft tissue finding. Calcified scalp vessel atherosclerosis redemonstrated. IMPRESSION: 1. No acute intracranial abnormality on postcontrast CT. Small area of right PCA territory encephalomalacia suspected. 2. Advanced calcified atherosclerosis. Electronically Signed   By: Odessa Fleming M.D.   On: 04/19/2022 08:18   DG Chest Port 1 View  Result Date: 04/18/2022 CLINICAL DATA:  71 year old male with history of hyperkalemia. Shortness of breath. Weakness. EXAM: PORTABLE CHEST 1 VIEW COMPARISON:  Chest x-ray 04/11/2022. FINDINGS: Lung volumes are low. No consolidative airspace disease. No pleural effusions. No pneumothorax. No pulmonary nodule  or mass noted. Pulmonary vasculature and the cardiomediastinal silhouette are within normal limits. Atherosclerotic calcifications in the thoracic aorta. IMPRESSION: 1. Low lung volumes without radiographic evidence of acute cardiopulmonary disease. 2. Aortic  atherosclerosis. Electronically Signed   By: Vinnie Langton M.D.   On: 04/18/2022 09:25    Labs:  CBC: Recent Labs    05/05/22 0616 05/13/22 0329 05/16/22 0353 05/17/22 0837  WBC 4.4 7.8 7.8 7.6  HGB 12.8* 12.6* 11.8* 12.3*  HCT 39.9 38.9* 36.5* 38.2*  PLT 244 295 302 278    COAGS: Recent Labs    03/30/22 1833 04/17/22 1322 04/17/22 2254 04/18/22 0223 04/18/22 1442 05/13/22 0329 05/17/22 0837  INR 2.2*  --   --   --   --  1.4* 1.0  APTT  --  33 63* 62* 51*  --   --     BMP: Recent Labs    05/13/22 0329 05/14/22 0652 05/15/22 0209 05/16/22 0353  NA 130* 135 132* 134*  K 4.7 5.0 4.6 4.5  CL 97* 99 98 99  CO2 27 27 26 27   GLUCOSE 190* 191* 203* 196*  BUN 47* 47* 52* 48*  CALCIUM 9.3 9.8 9.2 9.6  CREATININE 0.97 1.04 1.11 1.03  GFRNONAA >60 >60 >60 >60    LIVER FUNCTION TESTS: Recent Labs    04/24/22 0404 04/25/22 0110 04/26/22 0112 05/15/22 0209  BILITOT 0.5 0.2* 0.2* 0.3  AST 48* 38 27 50*  ALT 43 43 34 63*  ALKPHOS 63 68 70 80  PROT 5.8* 6.2* 6.7 6.8  ALBUMIN 1.7* 1.9* 2.0* 2.1*    TUMOR MARKERS: No results for input(s): "AFPTM", "CEA", "CA199", "CHROMGRNA" in the last 8760 hours.  Assessment and Plan:  Failure to thrive; poor oral intake: Christian [name], 71 year old male, is tentatively scheduled today for an image-guided gastrostomy tube placement. The procedure was discussed at the bedside with the patient he he signed the consent. The procedure was also discussed over the telephone with his daughter who is also his legal guardian.   Risks and benefits image guided gastrostomy tube placement was discussed with the patient including, but not limited to the need for a barium enema during the procedure, bleeding, infection, peritonitis and/or damage to adjacent structures.  All of the patient's questions were answered, patient is agreeable to proceed. He has been NPO. Blood thinners have been held.   Consent signed and in chart.  Thank  you for this interesting consult.  I greatly enjoyed meeting Christian [name] and look forward to participating in their care.  A copy of this report was sent to the requesting provider on this date.  Electronically Signed: Soyla Dryer, AGACNP-BC [phone] 05/17/2022, 11:54 AM   I spent a total of 20 Minutes    in face to face in clinical consultation, greater than 50% of which was counseling/coordinating care for gastrostomy tube

## 2022-05-17 NOTE — Progress Notes (Signed)
PT Cancellation Note  Patient Details Name: Christian [name] MRN: [id number] DOB: 12-09-1950   Cancelled Treatment:    Reason Eval/Treat Not Completed: (P) Other (comment);Medical issues which prohibited therapy (off unit for PEG placement.)   Tamasha Laplante Artis Delay 05/17/2022, 2:19 PM Karie Skowron R. , PTA Acute Rehabilitation Services

## 2022-05-17 NOTE — Progress Notes (Signed)
TRH night cross cover note:   I was contacted by RN requesting clarification if tube feeds via NGT should be held or if n.p.o. status should be initiated given the potential for IR guided PEG tube placement tomorrow (6/19).   Per my chart review, including review of most recent rounding hospitalist progress note, current plan does not appear to include holding tube feeds overnight or converting to NPO status at midnight. I updated RN regarding this plan.      Newton Pigg, DO Hospitalist

## 2022-05-17 NOTE — Progress Notes (Signed)
PROGRESS NOTE        PATIENT DETAILS Name: Christian [name] Age: 71 y.o. Sex: male Date of Birth: Jul 17, 1951 Admit Date: 03/30/2022 Admitting Physician Rise Patience, MD PCP:Sun, Gari Crown, MD  Brief Summary: Patient is a 71 y.o.  male with a history of A-fib on Xarelto, HFpEF, PAD, DM-2, bipolar disorder-presented with generalized weakness/sacral decubitus ulcer-was found to have hypoglycemia, hyperkalemia and subsequently admitted to the hospitalist service-see below for further details.  Thought to have extrapyramidal symptoms-evaluated by psych/neurology-psych medications adjusted with clinical improvement.  Unfortunately-further hospital course complicated by development of fever-due to probable Streptococcus bacteremia and infected decubitus ulcer.  See below for further details.  Significant events: 5/2>> admit to [facility] for weakness/hypoglycemia/hyperkalemia/rigidity-thought to have EPS 5/14>> febrile/tachycardic-Per nursing staff-discharge from prior existing sacral decubitus ulcer.  Started on broad-spectrum IV antibiotics.  Prelim blood culture positive for Streptococcus species. 5/19>> wound care starting hydrotherapy 6/05>>Cortak tube placed 6/07>>wound vac placed 6/14>> wound VAC removed   Significant studies: 5/3>> x-ray pelvis: No fracture. 5/3>> CT head: No acute intracranial findings. 5/3>> x-ray left shoulder: Rotator cuff impingement 5/14>> CXR:No Obvious PNA 5/15>> left upper extremity Doppler: No DVT. 5/16>> CT abdomen/pelvis: Inferior sacral decubitus ulceration-no evidence of osteomyelitis. 5/16>> Echo: EF 45-50%, no obvious vegetations.  Significant microbiology data: 5/14>> blood culture: Streptococcus constellatus  5/15>> blood culture: No growth  Procedures:   Consults: Neurology, psychiatry, infectious disease,CCS  Subjective:  Patient in bed, appears comfortable, denies any headache, no fever, no chest pain or pressure,  no shortness of breath , no abdominal pain. No new focal weakness.    Objective: Vitals: Blood pressure 115/79, pulse (!) 106, temperature 99.8 F (37.7 C), temperature source Oral, resp. rate 18, height 6' (1.829 m), weight 136.1 kg, SpO2 95 %.   Exam:  Awake Alert, No new F.N deficits, Normal affect, NG tube in place, diffuse weakness all over but worse in bilateral lower extremities, wearing surgical boots in both lower extremities [place].AT,PERRAL Supple Neck, No JVD,   Symmetrical Chest wall movement, Good air movement bilaterally, CTAB RRR,No Gallops, Rubs or new Murmurs,  +ve B.Sounds, Abd Soft, No tenderness,   No Cyanosis, Clubbing or edema      Assessment/Plan:  Rigidity/tremors/mildly elevated CK: Felt to be more of extrapyramidal syndrome rather than NMS at this point.  Managed with supportive care.  After neurology/psych evaluation-has been resumed on Abilify and Cogentin-Wellbutrin/Seroquel has been discontinued.  AKI on CKD stage IIIa: AKI likely hemodynamically mediated-improved-creatinine close to baseline.   Mild rhabdomyolysis: Nontraumatic-improved with supportive care.  Sepsis due to possible strep bacteremia-sacral decubitus ulcer with abscess:Sepsis physiology has resolved-has completed a course of antimicrobial therapy-completed hydrotherapy-wound care placed wound VAC on 05/05/22.    Failure to thrive syndrome/with poor nutrition Status: Dietitian following the patient, he after talking to his sister has agreed for NG tube and PEG tube, IR to place on 05/17/2022.   Acute metabolic encephalopathy: Due to sepsis-has resolved-relatively awake and alert.    Hyperkalemia: resolved  Hypothyroidism: Continue Cytomel.  PAF: Rate controlled-continue Xarelto/Lovenox for pending PEG tube.  Last dose of Lovenox will be morning of 05/16/2022 in preparation of PEG tube on 05/17/2022, Lovenox to be resumed by IR postprocedure.  IR informed on 05/17/2022  PAD: Follow-up with  vascular surgery in the outpatient setting  HFpEF: Volume status stable-suspect has some amount of edema in lower  extremities due to third spacing.  Demadex remains on hold.  Bipolar disorder: Given concerns for EPS-on Abilify and Cogentin-Depakote/Wellbutrin and Seroquel held.  Left shoulder pain: Supportive care-likely rotator cuff injury.  Stable to be followed up by orthopedics in the outpatient setting  Left arm swelling: Likely due to IVF infiltration-could have superficial venous thrombosis-left upper extremity Doppler negative for DVT.  Left arm swelling has resolved.  OSA: CPAP nightly  Acute urinary retention: Failed voiding trial on 6/5-continue Flomax-Foley catheter remains in place-we will plan on continuing Foley catheter to prevent soilage of sacral wound.  Debility/deconditioning: Has had worsening of his chronic debility during this hospitalization-plans are of SNF on discharge.  Per family-he has been nonambulatory since his discharge from [facility] in April 2023.  This likely resulted in decubitus ulcer that the patient presented with.  Hypoglycemia: Resolved with supportive care.  Do not resume Amaryl on discharge.  DM-2 (A1c 7.6 on 4/15): CBG now stable-continue Semglee 32 units and resistant SSI.  Watch closely and adjust regimen accordingly.  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 (Active)  03/31/22 1933  Location: Sacrum  Location Orientation: Mid  Staging: Stage 4 - Full thickness tissue loss with exposed bone, tendon or muscle.   Wound Description (Comments):   Present on Admission: Yes  Dressing Type ABD;Moist to dry 05/16/22 2056     Pressure Injury 05/08/22 Heel Right Deep Tissue Pressure Injury - Purple or maroon localized area of discolored intact skin or blood-filled blister due to damage of underlying soft tissue from pressure and/or shear. (Active)  05/08/22 1600  Location: Heel  Location Orientation: Right  Staging: Deep Tissue Pressure Injury - Purple or maroon localized area of discolored intact skin or blood-filled blister due to damage of underlying soft tissue from pressure and/or shear.  Wound Description (Comments):   Present on Admission: No  Dressing Type Foam - Lift dressing to assess site every shift 05/16/22 2056   Obesity: Estimated body mass index is 40.69 kg/m as calculated from the following:   Height as of this encounter: 6' (1.829 m).   Weight as of this encounter: 136.1 kg.   Code status:   Code Status: Full Code   DVT Prophylaxis:    Family Communication: [name]-[id number]-updated 6/15  Disposition Plan: Status is: Inpatient Remains inpatient appropriate because: Needs PEG tube placed-consulting IR on 6/15-ultimately to SNF hopefully sometime next week  Planned Discharge Destination:Skilled nursing facility    Diet: Diet Order             Diet NPO time specified  Diet effective now                    MEDICATIONS: Scheduled Meds:  ARIPiprazole  10 mg Oral q morning   atorvastatin  10 mg Oral Daily   benztropine  1 mg Oral BID   Chlorhexidine Gluconate Cloth  6 each Topical Daily   diclofenac Sodium  2 g Topical QID   feeding supplement  237 mL Oral BID BM   feeding supplement (GLUCERNA 1.5 CAL)  1,000 mL Per Tube  Q24H   feeding supplement (PROSource TF)  45 mL Per Tube TID   free water  150 mL Per Tube Q6H   insulin aspart  0-20 Units Subcutaneous Q4H   insulin glargine-yfgn  36 Units Subcutaneous Daily   isosorbide mononitrate  30 mg Oral  Daily   liothyronine  10 mcg Oral Daily   mouth rinse  15 mL Mouth Rinse BID   metoprolol succinate  50 mg Oral Daily   multivitamin with minerals  1 tablet Oral Daily   nutrition supplement (JUVEN)  1 packet Oral BID BM   senna-docusate  2 tablet Oral BID   tamsulosin  0.4 mg Oral Daily   Continuous Infusions:   PRN Meds:.acetaminophen **OR** acetaminophen, bisacodyl, HYDROcodone-acetaminophen, loperamide, melatonin, morphine injection   I have personally reviewed following labs and imaging studies  LABORATORY DATA:  Recent Labs  Lab 05/13/22 0329 05/16/22 0353  WBC 7.8 7.8  HGB 12.6* 11.8*  HCT 38.9* 36.5*  PLT 295 302  MCV 86.4 86.7  MCH 28.0 28.0  MCHC 32.4 32.3  RDW 16.4* 16.1*    Recent Labs  Lab 05/13/22 0329 05/14/22 0652 05/15/22 0209 05/16/22 0353  NA 130* 135 132* 134*  K 4.7 5.0 4.6 4.5  CL 97* 99 98 99  CO2 27 27 26 27   GLUCOSE 190* 191* 203* 196*  BUN 47* 47* 52* 48*  CREATININE 0.97 1.04 1.11 1.03  CALCIUM 9.3 9.8 9.2 9.6  AST  --   --  50*  --   ALT  --   --  63*  --   ALKPHOS  --   --  80  --   BILITOT  --   --  0.3  --   ALBUMIN  --   --  2.1*  --   MG  --   --  2.1 2.0  INR 1.4*  --   --   --     RADIOLOGY STUDIES/RESULTS: No results found.   LOS: 47 days   Signature  M.D on 05/17/2022 at 8:36 AM   -  To page go to [email or web address]

## 2022-05-17 NOTE — Procedures (Signed)
Interventional Radiology Procedure Note  Procedure: fluoro 20 fr gutbe    Complications: None  Estimated Blood Loss:  min  Findings: Confirmed in the stomach Full use tomorrow    Sharen Counter, MD

## 2022-05-17 NOTE — TOC Progression Note (Addendum)
Transition of Care [facility]) - Progression Note    Patient Details  Name: Christian [name] MRN: [id number] Date of Birth: November 17, 1951  Transition of Care [facility] CM/SW Contact  Janae Bridgeman, RN Phone Number: 05/17/2022, 11:46 AM  Clinical Narrative:    Patient is scheduled for PEG placement today.  I called and spoke with Revonda Standard, Admissions Director with [facility] and she states the facility is currently reviewing the patient's wound care needs and available RN staff before able to make a firm bed offer.  The patient's family prefers [facility] SNF at this time - otherwise [facility] and [facility] have made bed offers for placement.  Patient's daughter is Armed forces operational officer Guardian for the patient and documents are present in Epic and hard medical chart.  05/17/2022 1436-  Patient was transported to IR department to have PEG placed.  I spoke with Revonda Standard, Admission director with [facility] and they are unable to offer a bed to the patient but the [place], [place] location can make a bed offer.  I called and left a message with the patient's daughter, Kristopher Glee to give Medicare choice and left her a voicemail message.  I also spoke with Dr. Thedore Mins and the patient will be medically stable for discharge either Thursday or Friday of this week.  I will confirm with the daughter and ask the facility to start insurance authorization.  CM and MSW with DTP Team will continue to follow the patient for [facility] placement.   Expected Discharge Plan: Skilled Nursing Facility Barriers to Discharge: Continued Medical Work up  Expected Discharge Plan and Services Expected Discharge Plan: Skilled Nursing Facility In-house Referral: Clinical Social Work Discharge Planning Services: CM Consult Post Acute Care Choice: Skilled Nursing Facility Living arrangements for the past 2 months:  [facility] in [place], [place] prior to SNF)                                       Social  Determinants of Health (SDOH) Interventions    Readmission Risk Interventions    05/14/2022   12:39 PM 04/13/2022    3:06 PM 04/13/2022   10:43 AM  Readmission Risk Prevention Plan  Transportation Screening Complete  Complete  PCP or Specialist Appt within 3-5 Days  Complete   HRI or Home Care Consult  Complete   Social Work Consult for Recovery Care Planning/Counseling  Complete   Palliative Care Screening  Not Applicable   Medication Review Oceanographer) Complete Referral to Pharmacy Complete  PCP or Specialist appointment within 3-5 days of discharge Complete  Complete  HRI or Home Care Consult Complete  Complete  SW Recovery Care/Counseling Consult   Complete  Palliative Care Screening Not Applicable  Not Applicable  Skilled Nursing Facility Complete  Complete

## 2022-05-18 DIAGNOSIS — R7881 Bacteremia: Secondary | ICD-10-CM | POA: Diagnosis not present

## 2022-05-18 DIAGNOSIS — R627 Adult failure to thrive: Secondary | ICD-10-CM | POA: Diagnosis not present

## 2022-05-18 DIAGNOSIS — E875 Hyperkalemia: Secondary | ICD-10-CM | POA: Diagnosis not present

## 2022-05-18 DIAGNOSIS — L89152 Pressure ulcer of sacral region, stage 2: Secondary | ICD-10-CM | POA: Diagnosis not present

## 2022-05-18 DIAGNOSIS — I739 Peripheral vascular disease, unspecified: Secondary | ICD-10-CM | POA: Diagnosis not present

## 2022-05-18 LAB — COMPREHENSIVE METABOLIC PANEL
ALT: 84 U/L — ABNORMAL HIGH (ref 0–44)
AST: 40 U/L (ref 15–41)
Albumin: 2.3 g/dL — ABNORMAL LOW (ref 3.5–5.0)
Alkaline Phosphatase: 78 U/L (ref 38–126)
Anion gap: 10 (ref 5–15)
BUN: 44 mg/dL — ABNORMAL HIGH (ref 8–23)
CO2: 24 mmol/L (ref 22–32)
Calcium: 9.7 mg/dL (ref 8.9–10.3)
Chloride: 99 mmol/L (ref 98–111)
Creatinine, Ser: 0.96 mg/dL (ref 0.61–1.24)
GFR, Estimated: >60 mL/min (ref >60)
Glucose, Bld: 187 mg/dL — ABNORMAL HIGH (ref 70–99)
Potassium: 4.4 mmol/L (ref 3.5–5.1)
Sodium: 133 mmol/L — ABNORMAL LOW (ref 135–145)
Total Bilirubin: 0.8 mg/dL (ref 0.3–1.2)
Total Protein: 8.2 g/dL — ABNORMAL HIGH (ref 6.5–8.1)

## 2022-05-18 LAB — CBC WITH DIFFERENTIAL/PLATELET
Abs Immature Granulocytes: 0.04 10*3/uL (ref 0.00–0.07)
Basophils Absolute: 0 10*3/uL (ref 0.0–0.1)
Basophils Relative: 0 %
Eosinophils Absolute: 0.1 10*3/uL (ref 0.0–0.5)
Eosinophils Relative: 1 %
HCT: 42.3 % (ref 39.0–52.0)
Hemoglobin: 13.4 g/dL (ref 13.0–17.0)
Immature Granulocytes: 0 %
Lymphocytes Relative: 13 %
Lymphs Abs: 1.2 10*3/uL (ref 0.7–4.0)
MCH: 27.5 pg (ref 26.0–34.0)
MCHC: 31.7 g/dL (ref 30.0–36.0)
MCV: 86.9 fL (ref 80.0–100.0)
Monocytes Absolute: 0.7 10*3/uL (ref 0.1–1.0)
Monocytes Relative: 7 %
Neutro Abs: 7.5 10*3/uL (ref 1.7–7.7)
Neutrophils Relative %: 79 %
Platelets: 362 10*3/uL (ref 150–400)
RBC: 4.87 MIL/uL (ref 4.22–5.81)
RDW: 16 % — ABNORMAL HIGH (ref 11.5–15.5)
WBC: 9.5 10*3/uL (ref 4.0–10.5)
nRBC: 0 % (ref 0.0–0.2)

## 2022-05-18 LAB — GLUCOSE, CAPILLARY
Glucose-Capillary: 143 mg/dL — ABNORMAL HIGH (ref 70–99)
Glucose-Capillary: 167 mg/dL — ABNORMAL HIGH (ref 70–99)
Glucose-Capillary: 190 mg/dL — ABNORMAL HIGH (ref 70–99)
Glucose-Capillary: 171 mg/dL — ABNORMAL HIGH (ref 70–99)
Glucose-Capillary: 214 mg/dL — ABNORMAL HIGH (ref 70–99)
Glucose-Capillary: 214 mg/dL — ABNORMAL HIGH (ref 70–99)
Glucose-Capillary: 205 mg/dL — ABNORMAL HIGH (ref 70–99)

## 2022-05-18 LAB — MAGNESIUM: Magnesium: 2 mg/dL (ref 1.7–2.4)

## 2022-05-18 MED ORDER — ARIPIPRAZOLE 5 MG PO TABS
10.0000 mg | ORAL_TABLET | Freq: Every morning | ORAL | Status: DC
  Administered 2022-05-18 – 2022-05-20 (×3): 10 mg
  Filled 2022-05-18 (×2): qty 2

## 2022-05-18 MED ORDER — LIOTHYRONINE SODIUM 5 MCG PO TABS
10.0000 ug | ORAL_TABLET | Freq: Every day | ORAL | Status: DC
  Administered 2022-05-18 – 2022-05-20 (×3): 10 ug
  Filled 2022-05-18 (×2): qty 2

## 2022-05-18 MED ORDER — ATORVASTATIN CALCIUM 10 MG PO TABS
10.0000 mg | ORAL_TABLET | Freq: Every day | ORAL | Status: DC
Start: 2022-05-18 — End: 2022-05-20
  Administered 2022-05-18 – 2022-05-20 (×3): 10 mg
  Filled 2022-05-18 (×2): qty 1

## 2022-05-18 MED ORDER — GLUCERNA 1.5 CAL PO LIQD
1000.0000 mL | ORAL | Status: DC
Start: 2022-05-18 — End: 2022-05-20
  Administered 2022-05-18 – 2022-05-19 (×2): 1000 mL
  Filled 2022-05-18 (×3): qty 1000

## 2022-05-18 MED ORDER — ENOXAPARIN SODIUM 150 MG/ML IJ SOSY
140.0000 mg | PREFILLED_SYRINGE | Freq: Two times a day (BID) | INTRAMUSCULAR | Status: DC
Start: 2022-05-18 — End: 2022-05-19
  Administered 2022-05-18 (×2): 140 mg via SUBCUTANEOUS
  Filled 2022-05-18 (×3): qty 0.94

## 2022-05-18 MED ORDER — DEXTROSE 5 % IV SOLN
INTRAVENOUS | Status: AC
Start: 2022-05-18 — End: 2022-05-18

## 2022-05-18 MED ORDER — INSULIN GLARGINE-YFGN 100 UNIT/ML ~~LOC~~ SOLN
15.0000 [IU] | Freq: Two times a day (BID) | SUBCUTANEOUS | Status: DC
  Administered 2022-05-18 – 2022-05-20 (×4): 15 [IU] via SUBCUTANEOUS
  Filled 2022-05-18 (×5): qty 0.15

## 2022-05-18 MED ORDER — DEXTROSE 5 % IV SOLN
INTRAVENOUS | Status: DC
Start: 2022-05-18 — End: 2022-05-18

## 2022-05-18 MED ORDER — FENTANYL CITRATE PF 50 MCG/ML IJ SOSY: 25.0000 ug | PREFILLED_SYRINGE | Freq: Three times a day (TID) | INTRAMUSCULAR | Status: DC | PRN

## 2022-05-18 MED ORDER — BENZTROPINE MESYLATE 1 MG PO TABS
1.0000 mg | ORAL_TABLET | Freq: Two times a day (BID) | ORAL | Status: DC
  Administered 2022-05-18 – 2022-05-20 (×5): 1 mg
  Filled 2022-05-18 (×4): qty 1

## 2022-05-18 NOTE — TOC Progression Note (Addendum)
Transition of Care [facility]) - Progression Note    Patient Details  Name: Christian [name] MRN: [id number] Date of Birth: 1951-08-19  Transition of Care [facility]) CM/SW Contact  Janae Bridgeman, RN Phone Number: 05/18/2022, 7:54 AM  Clinical Narrative:    CM spoke with the patient's daughter, [name], on the phone 05/17/2022 at 1500 and gave her Medicare choice regarding SNF placement.  The patient's daughter accepted [facility] facility for placement.  I called and spoke with Revonda Standard, CM at the facility and insurance authorization will be started today, 05/18/2022 and she will discuss financial responsibility for admission with the daughter and transfer of funds from payee as requested by the family.  Patient will need to be transported to the facility by [facility] when authorization approved later in the week.  PASSR will be renewed on 05/21/22 prior to discharge.  I called and spoke with Revonda Standard, CM at [facility] facility and she plans to call and speak with the daughter involving cost of admission to the facility this afternoon by phone.  I also updated the facility regarding the new nutrition consult note from today so they are providing new nutrition orders.  Updated clinicals sent to renew PASSR that will expire tomorrow.  CM and MSW with DTP Team will continue to follow the patient for SNF placement - once medically ready and insurance authorization completed by the facility.   Expected Discharge Plan: Skilled Nursing Facility Barriers to Discharge: Continued Medical Work up  Expected Discharge Plan and Services Expected Discharge Plan: Skilled Nursing Facility In-house Referral: Clinical Social Work Discharge Planning Services: CM Consult Post Acute Care Choice: Skilled Nursing Facility Living arrangements for the past 2 months:  [facility] in [place], [place] prior to SNF)                                       Social Determinants of Health (SDOH) Interventions     Readmission Risk Interventions    05/14/2022   12:39 PM 04/13/2022    3:06 PM 04/13/2022   10:43 AM  Readmission Risk Prevention Plan  Transportation Screening Complete  Complete  PCP or Specialist Appt within 3-5 Days  Complete   HRI or Home Care Consult  Complete   Social Work Consult for Recovery Care Planning/Counseling  Complete   Palliative Care Screening  Not Applicable   Medication Review Oceanographer) Complete Referral to Pharmacy Complete  PCP or Specialist appointment within 3-5 days of discharge Complete  Complete  HRI or Home Care Consult Complete  Complete  SW Recovery Care/Counseling Consult   Complete  Palliative Care Screening Not Applicable  Not Applicable  Skilled Nursing Facility Complete  Complete

## 2022-05-18 NOTE — Progress Notes (Signed)
Referring Physician(s): Dr Burney Gauze   Supervising Physician: Mir, Mauri Reading  Patient Status:  [facility] - In-pt  Chief Complaint:  Dysphagia  Subjective:  Percutaneous gastric tube placed in IR 6/19 Doing well Site is clean and dry Slight tender No sign of infection Afeb  May use now  Allergies: Amlodipine, Onglyza [saxagliptin], Zyprexa [olanzapine], and Ritalin [methylphenidate]  Medications: Prior to Admission medications   Medication Sig Start Date End Date Taking? Authorizing Provider  acetaminophen (TYLENOL) 500 MG tablet Take 1,000 mg by mouth daily as needed (pain.).   Yes [provider]  ARIPiprazole (ABILIFY) 20 MG tablet Take 1 tablet (20 mg total) by mouth daily. 02/06/20  Yes Starkes-Perry, Juel Burrow, FNP  atorvastatin (LIPITOR) 10 MG tablet Take 1 tablet (10 mg total) by mouth daily at 6 PM. Patient taking differently: Take 10 mg by mouth every evening. 02/05/20  Yes Starkes-Perry, Juel Burrow, FNP  buPROPion (WELLBUTRIN) 75 MG tablet Take 1 tablet (75 mg total) by mouth daily. 02/06/20  Yes Starkes-Perry, Juel Burrow, FNP  divalproex (DEPAKOTE ER) 500 MG 24 hr tablet Take 500 mg by mouth in the morning and at bedtime.   Yes [provider]  empagliflozin (JARDIANCE) 10 MG TABS tablet Take 10 mg by mouth daily.  06/11/20  Yes [provider]  glimepiride (AMARYL) 2 MG tablet Take 2 mg by mouth in the morning.   Yes [provider]  insulin glargine (LANTUS) 100 UNIT/ML injection Inject 0.05 mLs (5 Units total) into the skin daily. 03/20/22  Yes Regalado, Belkys A, MD  liothyronine (CYTOMEL) 5 MCG tablet Take 10 mcg by mouth daily.   Yes [provider]  lisinopril (ZESTRIL) 20 MG tablet Take 20 mg by mouth daily.   Yes [provider]  metoprolol succinate (TOPROL-XL) 50 MG 24 hr tablet Take 1 tablet (50 mg total) by mouth daily. Take with or immediately following a meal. Patient taking differently: Take 50 mg by mouth daily.  01/28/22 03/13/23 Yes Marinda Elk, MD  mirtazapine (REMERON) 15 MG tablet Take 15 mg by mouth at bedtime. 05/01/21  Yes [provider]  polyethylene glycol powder (GLYCOLAX/MIRALAX) 17 GM/SCOOP powder Take 17 g by mouth daily. Patient taking differently: Take 17 g by mouth daily as needed for mild constipation. 10/14/20  Yes Ghimire, Werner Lean, MD  QUEtiapine (SEROQUEL) 25 MG tablet Take 0.5 tablets (12.5 mg total) by mouth at bedtime. Patient taking differently: Take 25 mg by mouth at bedtime. 03/16/22  Yes Calvert Cantor, MD  rivaroxaban (XARELTO) 20 MG TABS tablet Take 1 tablet (20 mg total) by mouth daily with supper. Patient taking differently: Take 20 mg by mouth daily. 04/02/20  Yes Dhungel, Nishant, MD  torsemide (DEMADEX) 20 MG tablet Take 1 tablet (20 mg total) by mouth daily. 03/16/22  Yes Calvert Cantor, MD  Vitamin D, Ergocalciferol, (DRISDOL) 1.25 MG (50000 UNIT) CAPS capsule Take 50,000 Units by mouth every 7 (seven) days.   Yes [provider]  ARIPiprazole (ABILIFY) 10 MG tablet Take 1 tablet (10 mg total) by mouth every morning. 04/29/22   Lanae Boast, MD  benztropine (COGENTIN) 1 MG tablet Take 1 tablet (1 mg total) by mouth 2 (two) times daily. 04/29/22   Lanae Boast, MD  bisacodyl (DULCOLAX) 5 MG EC tablet Take 2 tablets (10 mg total) by mouth daily as needed for moderate constipation. 04/29/22   Lanae Boast, MD  feeding supplement (ENSURE ENLIVE / ENSURE PLUS) LIQD Take 237 mLs by  mouth 3 (three) times daily between meals. 04/29/22   Lanae Boast, MD  HYDROcodone-acetaminophen (NORCO/VICODIN) 5-325 MG tablet Take 1 tablet by mouth every 6 (six) hours as needed for up to 4 doses for moderate pain. 04/29/22   Lanae Boast, MD  insulin glargine-yfgn (SEMGLEE) 100 UNIT/ML injection Inject 0.1 mLs (10 Units total) into the skin daily. 04/29/22   Lanae Boast, MD  melatonin 10 MG TABS Take 10 mg by mouth at bedtime as needed (sleep). 04/29/22   Lanae Boast, MD  nutrition supplement, JUVEN,  (JUVEN) PACK Take 1 packet by mouth 2 (two) times daily between meals. 04/29/22   Lanae Boast, MD  QUEtiapine (SEROQUEL) 50 MG tablet Take 50 mg by mouth at bedtime. Patient not taking: Reported on 03/31/2022    [provider]  senna-docusate (SENOKOT-S) 8.6-50 MG tablet Take 2 tablets by mouth 2 (two) times daily. 04/29/22   Lanae Boast, MD     Vital Signs: BP (!) 146/68   Pulse (!) 49   Temp 98.9 F (37.2 C) (Oral)   Resp 16   Ht 6' (1.829 m)   Wt 300 lb (136.1 kg)   SpO2 94%   BMI 40.69 kg/m   Physical Exam Vitals reviewed.  Skin:    General: Skin is warm.     Comments: Site is clean and dry No bleeding  No hematoma Minimally tender No sign of infection     Imaging: IR GASTROSTOMY TUBE MOD SED  Result Date: 05/17/2022 INDICATION: Dysphagia EXAM: 20 FRENCH PULL-THROUGH GASTROSTOMY Date:  05/17/2022 05/17/2022 2:57 pm Radiologist:  Judie Petit. Ruel Favors, MD Guidance:  Ultrasound and fluoroscopic MEDICATIONS: 2 G ANCEF; Antibiotics were administered within 1 hour of the procedure. Glucagon 0.5 mg IV ANESTHESIA/SEDATION: Versed 1.5 mg IV; Fentanyl 50 mcg IV Moderate Sedation Time:  16 minutes The patient was continuously monitored during the procedure by the interventional radiology nurse under my direct supervision. CONTRAST:  22mL OMNIPAQUE IOHEXOL 300 MG/ML SOLN - administered into the gastric lumen. FLUOROSCOPY: Fluoroscopy Time: 4 minutes 0 seconds (50 mGy). COMPLICATIONS: None immediate. PROCEDURE: Informed consent was obtained from the patient following explanation of the procedure, risks, benefits and alternatives. The patient understands, agrees and consents for the procedure. All questions were addressed. A time out was performed. Maximal barrier sterile technique utilized including caps, mask, sterile gowns, sterile gloves, large sterile drape, hand hygiene, and betadine prep. The left upper quadrant was sterilely prepped and draped. An oral gastric catheter was inserted into  the stomach under fluoroscopy. The existing nasogastric feeding tube was removed. Air was injected into the stomach for insufflation and visualization under fluoroscopy. The air distended stomach was confirmed beneath the anterior abdominal wall in the frontal and lateral projections. Under sterile conditions and local anesthesia, a 17 gauge trocar needle was utilized to access the stomach percutaneously beneath the left subcostal margin. Needle position was confirmed within the stomach under biplane fluoroscopy. Contrast injection confirmed position also. A single T tack was deployed for gastropexy. Over an Amplatz guide wire, a 9-French sheath was inserted into the stomach. A snare device was utilized to capture the oral gastric catheter. The snare device was pulled retrograde from the stomach up the esophagus and out the oropharynx. The 20-French pull-through gastrostomy was connected to the snare device and pulled antegrade through the oropharynx down the esophagus into the stomach and then through the percutaneous tract external to the patient. The gastrostomy was assembled externally. Contrast injection confirms position in the stomach. Images were obtained  for documentation. The patient tolerated procedure well. No immediate complication. IMPRESSION: Fluoroscopic insertion of a 20-French "pull-through" gastrostomy. Electronically Signed   By: Jerilynn Mages.  Shick M.D.   On: 05/17/2022 15:49    Labs:  CBC: Recent Labs    05/13/22 0329 05/16/22 0353 05/17/22 0837 05/18/22 0430  WBC 7.8 7.8 7.6 9.5  HGB 12.6* 11.8* 12.3* 13.4  HCT 38.9* 36.5* 38.2* 42.3  PLT 295 302 278 362    COAGS: Recent Labs    03/30/22 1833 04/17/22 1322 04/17/22 2254 04/18/22 0223 04/18/22 1442 05/13/22 0329 05/17/22 0837  INR 2.2*  --   --   --   --  1.4* 1.0  APTT  --  33 63* 62* 51*  --   --     BMP: Recent Labs    05/14/22 0652 05/15/22 0209 05/16/22 0353 05/18/22 0430  NA 135 132* 134* 133*  K 5.0 4.6 4.5  4.4  CL 99 98 99 99  CO2 27 26 27 24   GLUCOSE 191* 203* 196* 187*  BUN 47* 52* 48* 44*  CALCIUM 9.8 9.2 9.6 9.7  CREATININE 1.04 1.11 1.03 0.96  GFRNONAA >60 >60 >60 >60    LIVER FUNCTION TESTS: Recent Labs    04/25/22 0110 04/26/22 0112 05/15/22 0209 05/18/22 0430  BILITOT 0.2* 0.2* 0.3 0.8  AST 38 27 50* 40  ALT 43 34 63* 84*  ALKPHOS 68 70 80 78  PROT 6.2* 6.7 6.8 8.2*  ALBUMIN 1.9* 2.0* 2.1* 2.3*    Assessment and Plan:  G tube placed in IR 6/19 May use now  Electronically Signed: Lavonia Drafts, PA-C 05/18/2022, 9:26 AM   I spent a total of 15 Minutes at the the patient's bedside AND on the patient's hospital floor or unit, greater than 50% of which was counseling/coordinating care for G tube placement

## 2022-05-18 NOTE — Progress Notes (Signed)
ANTICOAGULATION CONSULT NOTE - Follow Up Consult  Pharmacy Consult for Lovenox Indication: atrial fibrillation  Allergies  Allergen Reactions   Amlodipine Swelling and Other (See Comments)    Legs became swollen   Onglyza [Saxagliptin] Other (See Comments)    "Allergic," per [facility]   Zyprexa [Olanzapine] Other (See Comments)    Allergy noted by ACT Team. No reaction specified (??)- "Allergic," per [facility]   Ritalin [Methylphenidate] Anxiety and Other (See Comments)    "[place]"    Patient Measurements: Height: 6' (182.9 cm) Weight: 136.1 kg (300 lb) IBW/kg (Calculated) : 77.6 Lovenox Dosing Weight: 136 kg  Vital Signs: Temp: 98.5 F (36.9 C) (06/20 0957) Temp Source: Oral (06/20 0957) BP: 143/99 (06/20 0957) Pulse Rate: 100 (06/20 0957)  Labs: Recent Labs    05/16/22 0353 05/17/22 0837 05/18/22 0430  HGB 11.8* 12.3* 13.4  HCT 36.5* 38.2* 42.3  PLT 302 278 362  LABPROT  --  13.2  --   INR  --  1.0  --   CREATININE 1.03  --  0.96    Estimated Creatinine Clearance: 105.2 mL/min (by C-G formula based on SCr of 0.96 mg/dL).  Assessment:  71 yr old male previously on Xarelto 20 mg daily for afib. Xarelto held on 6/16 and Lovenox bridge begun, in anticipation of PEG placement on 6/19. Lovenox held after 6/18 pm dose. S/p PEG placement on 6/19, Lovenox resuming today.  Goal of Therapy:  Anti-Xa level 0.6-1 units/ml 4hrs after LMWH dose given Monitor platelets by anticoagulation protocol: Yes   Plan:  Lovenox 140 mg SQ q12h. Follow up for transition back to Xarelto when able. Intermittent CBC. Monitor for signs/symptoms of bleeding.  Christian [name], RPh 05/18/2022,12:57 PM

## 2022-05-18 NOTE — Progress Notes (Signed)
All PO meds were held this shift since pt is NPO with a new G-tube that's waiting for placement check in the morning before using it.

## 2022-05-18 NOTE — Progress Notes (Signed)
Nutrition Follow-up  DOCUMENTATION CODES:   Not applicable  INTERVENTION:   Recommend obtaining new weight. RN notified. Continue 1 packet of Juven - BID Continue Multivitamin w/ minerals daily Nocturnal Tube Feeds via PEG: Increase Glucerna 1.5 to 100 mL/hr x 12 hours (1200 mL/day) 45 mL ProSource TF - TID 150 mL free water flush q6h Provides 1920 kcal, 132 gm of protein, and 1511 mL total free water (TF + flush) daily.   NUTRITION DIAGNOSIS:   Increased nutrient needs related to chronic illness, wound healing as evidenced by estimated needs. - Ongoing  GOAL:   Patient will meet greater than or equal to 90% of their needs - Ongoing  MONITOR:   PO intake, Supplement acceptance, TF tolerance, Labs, Skin  REASON FOR ASSESSMENT:   Consult Enteral/tube feeding initiation and management  ASSESSMENT:   71 y.o. male presented to the ED with generalized weakness. Pt recently discharged from rehab after hospital admission due to hypoglycemia cellulitis. PMH includes CHF, CKD III, HTN, and T2DM. Pt admitted with hypoglycemia, hyperkalemia, and generalized weakness.  5/11 - diet downgraded to Dysphagia 1, Nectar Thick Liquids 5/15 - NPO 5/16 - diet advanced to Dysphagia 1, Nectar Thick Liquids 5/18 - diet advanced to thin liquids 5/19 - Hydrotherapy started 5/25 - diet advanced to Dysphagia 2 6/02 - diet advanced to regular 6/05 - Cortrak placed (tip gastric) 6/07 - wound VAC applied to sacrum 6/14 - wound VAC d/c 6/19 - NPO; PEG placed  6/20 - diet advanced to regular  Pt resting in bed, speaks minimally to RD.  RD explained what the PEG is used for. Encouraged pt to continue to eat by mouth, drink his Ensure's and Juven. Reviewed that the PEG is to just help him out.  RD took pt lunch order for him to help encourage him to eat more.   Discussed case with RN. RN reports that he is still drinking his Ensure's. Plan to increase TF rate due to pt still not eating much by  mouth. Discussed current wound on sacrum.  Pt with no new weight in > 7 days and suspect that current weight is inaccurate; pt with daily weights ordered per Adult Tube Feeding Protocol. Reached out to RN to obtain new weight.    Medications reviewed and include: SSI, Semglee, MVI, Senokot Labs reviewed: Sodium 133, BUN 44, 24 hr CBG 130-190  Diet Order:   Diet Order             Diet regular Room service appropriate? Yes with Assist; Fluid consistency: Thin  Diet effective now                   EDUCATION NEEDS:   No education needs have been identified at this time  Skin:  Skin Assessment: Skin Integrity Issues: Skin Integrity Issues:: Unstageable Stage III: Sacrum Unstageable: Coccyx & Buttocks  Last BM:  6/19  Height:   Ht Readings from Last 1 Encounters:  04/17/22 6' (1.829 m)    Weight:   Wt Readings from Last 1 Encounters:  05/06/22 136.1 kg    Ideal Body Weight:  80.9 kg  BMI:  Body mass index is 40.69 kg/m.  Estimated Nutritional Needs:   Kcal:  2100-2300 Protein:  105-120 grams Fluid:  >/= 2.1 L    Kirby Crigler RD, LDN Clinical Dietitian See [facility] for contact information.

## 2022-05-18 NOTE — Plan of Care (Signed)
  Problem: Pain Managment: Goal: General experience of comfort will improve Outcome: Progressing   Problem: Safety: Goal: Ability to remain free from injury will improve Outcome: Progressing   Problem: Coping: Goal: Ability to adjust to condition or change in health will improve Outcome: Progressing   Problem: Metabolic: Goal: Ability to maintain appropriate glucose levels will improve Outcome: Progressing

## 2022-05-18 NOTE — Progress Notes (Signed)
PROGRESS NOTE        PATIENT DETAILS Name: Christian [name] Age: 71 y.o. Sex: male Date of Birth: 1951-11-18 Admit Date: 03/30/2022 Admitting Physician Eduard Clos, MD PCP:Sun, Charise Carwin, MD  Brief Summary: Patient is a 71 y.o.  male with a history of A-fib on Xarelto, HFpEF, PAD, DM-2, bipolar disorder-presented with generalized weakness/sacral decubitus ulcer-was found to have hypoglycemia, hyperkalemia and subsequently admitted to the hospitalist service-see below for further details.  Thought to have extrapyramidal symptoms-evaluated by psych/neurology-psych medications adjusted with clinical improvement.  Unfortunately-further hospital course complicated by development of fever-due to probable Streptococcus bacteremia and infected decubitus ulcer.  See below for further details.  Significant events: 5/2>> admit to [facility] for weakness/hypoglycemia/hyperkalemia/rigidity-thought to have EPS 5/14>> febrile/tachycardic-Per nursing staff-discharge from prior existing sacral decubitus ulcer.  Started on broad-spectrum IV antibiotics.  Prelim blood culture positive for Streptococcus species. 5/19>> wound care starting hydrotherapy 6/05>>Cortak tube placed 6/07>>wound vac placed 6/14>> wound VAC removed   Significant studies: 5/3>> x-ray pelvis: No fracture. 5/3>> CT head: No acute intracranial findings. 5/3>> x-ray left shoulder: Rotator cuff impingement 5/14>> CXR:No Obvious PNA 5/15>> left upper extremity Doppler: No DVT. 5/16>> CT abdomen/pelvis: Inferior sacral decubitus ulceration-no evidence of osteomyelitis. 5/16>> Echo: EF 45-50%, no obvious vegetations.  Significant microbiology data: 5/14>> blood culture: Streptococcus constellatus  5/15>> blood culture: No growth  Procedures:   Consults: Neurology, psychiatry, infectious disease,CCS  Subjective:  Patient in bed, appears comfortable, denies any headache, no fever, no chest pain or pressure,  no shortness of breath , no abdominal pain. No new focal weakness.    Objective: Vitals: Blood pressure (!) 146/68, pulse (!) 49, temperature 98.9 F (37.2 C), temperature source Oral, resp. rate 16, height 6' (1.829 m), weight 136.1 kg, SpO2 94 %.   Exam:  Awake, No new F.N deficits, Normal affect, PEG tube in place, diffuse weakness all over but worse in bilateral lower extremities, wearing surgical boots in both lower extremities, Foley [place].AT,PERRAL Supple Neck, No JVD,   Symmetrical Chest wall movement, Good air movement bilaterally, CTAB RRR,No Gallops, Rubs or new Murmurs,  +ve B.Sounds, Abd Soft, No tenderness,   No Cyanosis, Clubbing or edema    Assessment/Plan:  Rigidity/tremors/mildly elevated CK: Felt to be more of extrapyramidal syndrome rather than NMS at this point.  Managed with supportive care.  After neurology/psych evaluation-has been resumed on Abilify and Cogentin-Wellbutrin/Seroquel has been discontinued.  AKI on CKD stage IIIa: AKI likely hemodynamically mediated-improved-creatinine close to baseline.   Mild rhabdomyolysis: Nontraumatic-resolved.  Sepsis due to possible strep bacteremia-sacral decubitus ulcer with abscess:Sepsis physiology has resolved-has completed a course of antimicrobial therapy-completed hydrotherapy-wound care placed wound VAC on 05/05/22.    Failure to thrive syndrome/with poor nutrition Status: Dietitian following the patient, PEG by IR placed on 05/17/2022, advance tube feeds once cleared by IR under the guidance of her dietitian, till he is n.p.o. gentle D5W on 05/17/2022 for 24 hours.  Acute metabolic encephalopathy: Due to sepsis-has resolved-relatively awake and alert.    Hypothyroidism: Continue Cytomel.  PAF: Rate controlled-continue Xarelto/Lovenox, case discussed with IR physician Dr. Marguerita Merles on 05/17/2022 after PEG tube placement, okay to resume Lovenox on 05/18/2022.  PAD: Follow-up with vascular surgery in the outpatient  setting  HFpEF: Volume status stable-suspect has some amount of edema in lower extremities due to third spacing.  Demadex remains on hold.  Bipolar disorder: Given concerns for EPS-on Abilify and Cogentin-Depakote/Wellbutrin and Seroquel held.  Left shoulder pain: Supportive care-likely rotator cuff injury.  Stable to be followed up by orthopedics in the outpatient setting  Left arm swelling: Likely due to IVF infiltration-could have superficial venous thrombosis-left upper extremity Doppler negative for DVT.  Left arm swelling has resolved.  OSA: CPAP nightly  Acute urinary retention: Failed voiding trial on 6/5-continue Flomax-Foley catheter remains in place-we will plan on continuing Foley catheter to prevent soilage of sacral wound.  Debility/deconditioning: Has had worsening of his chronic debility during this hospitalization-plans are of SNF on discharge.  Per family-he has been nonambulatory since his discharge from [place] in April 2023.  This likely resulted in decubitus ulcer that the patient presented with.  Hypoglycemia: Resolved with supportive care.  Do not resume Amaryl on discharge.  DM-2 (A1c 7.6 on 4/15): Semglee dose adjusted on 05/18/2022 on sliding scale continue to monitor while n.p.o. gentle D5 drip.  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 (Active)  03/31/22 1933  Location: Sacrum  Location Orientation: Mid  Staging: Stage 4 - Full thickness tissue loss with exposed bone, tendon or muscle.  Wound Description (Comments):   Present  on Admission: Yes  Dressing Type Moist to dry;Foam - Lift dressing to assess site every shift 05/17/22 1840     Pressure Injury 05/08/22 Heel Right Deep Tissue Pressure Injury - Purple or maroon localized area of discolored intact skin or blood-filled blister due to damage of underlying soft tissue from pressure and/or shear. (Active)  05/08/22 1600  Location: Heel  Location Orientation: Right  Staging: Deep Tissue Pressure Injury - Purple or maroon localized area of discolored intact skin or blood-filled blister due to damage of underlying soft tissue from pressure and/or shear.  Wound Description (Comments):   Present on Admission: No  Dressing Type Foam - Lift dressing to assess site every shift 05/17/22 2137   Obesity: Estimated body mass index is 40.69 kg/m as calculated from the following:   Height as of this encounter: 6' (1.829 m).   Weight as of this encounter: 136.1 kg.   Code status:   Code Status: Full Code   DVT Prophylaxis:    Family Communication: [name]-[phone]-updated 6/15  Disposition Plan: Status is: Inpatient  Planned Discharge Destination:Skilled nursing facility    Diet: Diet Order             Diet NPO time specified  Diet effective now                    MEDICATIONS: Scheduled Meds:  ARIPiprazole  10 mg Oral q morning   atorvastatin  10 mg Oral Daily   benztropine  1 mg Oral BID   Chlorhexidine Gluconate Cloth  6 each Topical Daily   diclofenac Sodium  2 g Topical QID   feeding supplement  237 mL Oral BID BM   feeding supplement (GLUCERNA 1.5 CAL)  1,000 mL Per Tube Q24H   feeding supplement (PROSource TF)  45 mL Per Tube TID   free water  150 mL  Per Tube Q6H   insulin aspart  0-20 Units Subcutaneous Q4H   insulin glargine-yfgn  15 Units Subcutaneous BID   isosorbide mononitrate  30 mg Oral Daily   liothyronine  10 mcg Oral Daily   mouth rinse  15 mL Mouth Rinse BID   metoprolol succinate  50 mg Oral Daily    multivitamin with minerals  1 tablet Oral Daily   nutrition supplement (JUVEN)  1 packet Oral BID BM   senna-docusate  2 tablet Oral BID   tamsulosin  0.4 mg Oral Daily   Continuous Infusions:  dextrose      PRN Meds:.acetaminophen **OR** acetaminophen, bisacodyl, fentaNYL (SUBLIMAZE) injection, HYDROcodone-acetaminophen, loperamide, melatonin   I have personally reviewed following labs and imaging studies  LABORATORY DATA:  Recent Labs  Lab 05/13/22 0329 05/16/22 0353 05/17/22 0837 05/18/22 0430  WBC 7.8 7.8 7.6 9.5  HGB 12.6* 11.8* 12.3* 13.4  HCT 38.9* 36.5* 38.2* 42.3  PLT 295 302 278 362  MCV 86.4 86.7 87.4 86.9  MCH 28.0 28.0 28.1 27.5  MCHC 32.4 32.3 32.2 31.7  RDW 16.4* 16.1* 15.8* 16.0*  LYMPHSABS  --   --   --  1.2  MONOABS  --   --   --  0.7  EOSABS  --   --   --  0.1  BASOSABS  --   --   --  0.0    Recent Labs  Lab 05/13/22 0329 05/14/22 0652 05/15/22 0209 05/16/22 0353 05/17/22 0837 05/18/22 0430  NA 130* 135 132* 134*  --  133*  K 4.7 5.0 4.6 4.5  --  4.4  CL 97* 99 98 99  --  99  CO2 27 27 26 27   --  24  GLUCOSE 190* 191* 203* 196*  --  187*  BUN 47* 47* 52* 48*  --  44*  CREATININE 0.97 1.04 1.11 1.03  --  0.96  CALCIUM 9.3 9.8 9.2 9.6  --  9.7  AST  --   --  50*  --   --  40  ALT  --   --  63*  --   --  84*  ALKPHOS  --   --  80  --   --  78  BILITOT  --   --  0.3  --   --  0.8  ALBUMIN  --   --  2.1*  --   --  2.3*  MG  --   --  2.1 2.0  --  2.0  INR 1.4*  --   --   --  1.0  --     RADIOLOGY STUDIES/RESULTS: IR GASTROSTOMY TUBE MOD SED  Result Date: 05/17/2022 INDICATION: Dysphagia EXAM: 20 FRENCH PULL-THROUGH GASTROSTOMY Date:  05/17/2022 05/17/2022 2:57 pm Radiologist:  05/19/2022. Judie Petit, MD Guidance:  Ultrasound and fluoroscopic MEDICATIONS: 2 G ANCEF; Antibiotics were administered within 1 hour of the procedure. Glucagon 0.5 mg IV ANESTHESIA/SEDATION: Versed 1.5 mg IV; Fentanyl 50 mcg IV Moderate Sedation Time:  16 minutes The patient  was continuously monitored during the procedure by the interventional radiology nurse under my direct supervision. CONTRAST:  38mL OMNIPAQUE IOHEXOL 300 MG/ML SOLN - administered into the gastric lumen. FLUOROSCOPY: Fluoroscopy Time: 4 minutes 0 seconds (50 mGy). COMPLICATIONS: None immediate. PROCEDURE: Informed consent was obtained from the patient following explanation of the procedure, risks, benefits and alternatives. The patient understands, agrees and consents for the procedure. All questions were addressed. A time out was performed. Maximal barrier  sterile technique utilized including caps, mask, sterile gowns, sterile gloves, large sterile drape, hand hygiene, and betadine prep. The left upper quadrant was sterilely prepped and draped. An oral gastric catheter was inserted into the stomach under fluoroscopy. The existing nasogastric feeding tube was removed. Air was injected into the stomach for insufflation and visualization under fluoroscopy. The air distended stomach was confirmed beneath the anterior abdominal wall in the frontal and lateral projections. Under sterile conditions and local anesthesia, a 56 gauge trocar needle was utilized to access the stomach percutaneously beneath the left subcostal margin. Needle position was confirmed within the stomach under biplane fluoroscopy. Contrast injection confirmed position also. A single T tack was deployed for gastropexy. Over an Amplatz guide wire, a 9-French sheath was inserted into the stomach. A snare device was utilized to capture the oral gastric catheter. The snare device was pulled retrograde from the stomach up the esophagus and out the oropharynx. The 20-French pull-through gastrostomy was connected to the snare device and pulled antegrade through the oropharynx down the esophagus into the stomach and then through the percutaneous tract external to the patient. The gastrostomy was assembled externally. Contrast injection confirms position in the  stomach. Images were obtained for documentation. The patient tolerated procedure well. No immediate complication. IMPRESSION: Fluoroscopic insertion of a 20-French "pull-through" gastrostomy. Electronically Signed   By: Jerilynn Mages.  Shick M.D.   On: 05/17/2022 15:49     LOS: 48 days   Signature  Lala Lund M.D on 05/18/2022 at 8:28 AM   -  To page go to [email or web address]

## 2022-05-18 NOTE — Progress Notes (Signed)
Physical Therapy Treatment Patient Details Name: Christian [name] MRN: [id number] DOB: 09-Sep-1951 Today's Date: 05/18/2022   History of Present Illness Patient is a 71 y.o.  male presented with generalized weakness/sacral decubitus ulcer-was found to have hypoglycemia, hyperkalemia. Thought to have extrapyramidal symptoms-evaluated by psych/neurology-psych medications adjusted with clinical improvement.  Hospital course complicated by development of fever-due to probable Streptococcus bacteremia and infected decubitus ulcer. 5/19: wound care starting hydrotherapy. 6/05: Cortak tube placed  6/07: wound vac placed, hydro DC. 6/14: wound vac removed. PMH: A-fib on Xarelto, HFpEF, PAD, DM-2, bipolar disorder.    PT Comments    Pt received in supine, agreeable to therapy session, pt limited due to pain at sacral wound site and bowel incontinence during bed mobility. Pt noted to be in need of bed linen change/peri-care during session and RN present to re-dress wound, pt limited due to severe sacral pain and fatigued after rolling to L/R sides for bed mobility. Pt needing up to +2 modA for rolling and unable to achieve long sitting posture at EOB with maxA due to fatigue/pain after clean-up. Pt performed sidelying and supine UE/LE exercises for strengthening. Plan to lift pt OOB to chair for transfer training next session if able. Pt continues to benefit from PT services to progress toward functional mobility goals.    Recommendations for follow up therapy are one component of a multi-disciplinary discharge planning process, led by the attending physician.  Recommendations may be updated based on patient status, additional functional criteria and insurance authorization.  Follow Up Recommendations  PT at Long-term acute care hospital     Assistance Recommended at Discharge Frequent or constant Supervision/Assistance  Patient can return home with the following Two people to help with walking and/or  transfers;Assistance with cooking/housework;Assist for transportation;Help with stairs or ramp for entrance;Two people to help with bathing/dressing/bathroom;Assistance with feeding;Direct supervision/assist for medications management;Direct supervision/assist for financial management   Equipment Recommendations  Other (comment) (mechanical lift, likely hospital bed pending progress)    Recommendations for Other Services       Precautions / Restrictions Precautions Precautions: Fall Precaution Comments: monitor HR, G-tube, Stage IV decubitus ulcer and R heel deep tissue pressure injury Restrictions Weight Bearing Restrictions: No     Mobility  Bed Mobility Overal bed mobility: Needs Assistance Bed Mobility: Rolling Rolling: Mod assist, +2 for safety/equipment         General bed mobility comments: pt c/o discomfort at decubitus ulcer but able to roll to L/R sides x2 reps ea for bed linen change due to soiled bed and hygiene assist; RN present to assist with clean-up. Ppt able to partially pull up on B side rails with maxA, unable to reach full long sitting posture despite HOB elevated ~35* with +1 assist due to pt LBP/weakness.    Transfers                        Ambulation/Gait                   Stairs             Wheelchair Mobility    Modified Rankin (Stroke Patients Only)       Balance Overall balance assessment: Needs assistance, History of Falls     Sitting balance - Comments: pt declined sitting on EOB  Cognition Arousal/Alertness: Awake/alert Behavior During Therapy: Flat affect Overall Cognitive Status: Impaired/Different from baseline Area of Impairment: Attention, Following commands, Problem solving, Memory                   Current Attention Level: Sustained Memory: Decreased short-term memory Following Commands: Follows one step commands with increased time      Problem Solving: Slow processing, Decreased initiation, Difficulty sequencing, Requires verbal cues, Requires tactile cues General Comments: following 1-step commands with increased time, pt able to indicate when thirsty but had not alerted staff to need for peri-care/clean-up after bowel incontinence.        Exercises General Exercises - Upper Extremity Shoulder Flexion: AAROM, Both, 10 reps, Supine Elbow Flexion: AROM, Both, 10 reps, Supine Elbow Extension: AROM, Both, 10 reps, Sidelying General Exercises - Lower Extremity Ankle Circles/Pumps: AROM, Both, 10 reps, Sidelying Short Arc Quad: AROM, AAROM, Both, 10 reps, Supine Heel Slides: AAROM, Both, 10 reps, Supine Hip ABduction/ADduction: Both, Supine, AAROM, 10 reps Straight Leg Raises: AAROM, Both, 5 reps, Supine Other Exercises Other Exercises: pulling up on bed rails to lift trunk/mini crunch x3 reps (with maxA unable to achieve long sitting with +1)    General Comments General comments (skin integrity, edema, etc.): RN present to pack wound during peri-care, wound appears beefy red, no yellow or black areas, but open (deep) to bone.      Pertinent Vitals/Pain Pain Assessment Pain Assessment: 0-10 Pain Score: 7  Pain Location: area around decubitus ulcer after rolling and clean-up of bed linen areas Pain Descriptors / Indicators: Discomfort, Guarding, Grimacing Pain Intervention(s): Monitored during session, Repositioned, Limited activity within patient's tolerance           PT Goals (current goals can now be found in the care plan section) Acute Rehab PT Goals Patient Stated Goal: less pain, to keep working on OOB mobility when pain controlled PT Goal Formulation: With patient Time For Goal Achievement: 05/31/22 Progress towards PT goals: Progressing toward goals    Frequency    Min 2X/week      PT Plan Current plan remains appropriate       AM-PAC PT "6 Clicks" Mobility   Outcome Measure  Help needed  turning from your back to your side while in a flat bed without using bedrails?: A Lot Help needed moving from lying on your back to sitting on the side of a flat bed without using bedrails?: Total Help needed moving to and from a bed to a chair (including a wheelchair)?: Total Help needed standing up from a chair using your arms (e.g., wheelchair or bedside chair)?: Total Help needed to walk in hospital room?: Total Help needed climbing 3-5 steps with a railing? : Total 6 Click Score: 7    End of Session   Activity Tolerance: Patient limited by fatigue;Patient limited by pain Patient left: in bed;with call bell/phone within reach;with bed alarm set;Other (comment) (pressure relief boots donned, pillow under L hip/low back to offload pressure) Nurse Communication: Mobility status;Patient requests pain meds PT Visit Diagnosis: Unsteadiness on feet (R26.81);History of falling (Z91.81);Repeated falls (R29.6);Difficulty in walking, not elsewhere classified (R26.2)     Time: [id number] PT Time Calculation (min) (ACUTE ONLY): 49 min  Charges:  $Therapeutic Exercise: 8-22 mins $Therapeutic Activity: 23-37 mins                     Tiant Peixoto P., PTA Acute Rehabilitation Services Secure Chat Preferred 9a-5:30pm Office: [place]  Jaykwon Morones 05/18/2022, 3:30 PM

## 2022-05-19 ENCOUNTER — Inpatient Hospital Stay (HOSPITAL_COMMUNITY): Payer: Medicare HMO

## 2022-05-19 DIAGNOSIS — I739 Peripheral vascular disease, unspecified: Secondary | ICD-10-CM | POA: Diagnosis not present

## 2022-05-19 DIAGNOSIS — L89152 Pressure ulcer of sacral region, stage 2: Secondary | ICD-10-CM | POA: Diagnosis not present

## 2022-05-19 DIAGNOSIS — R627 Adult failure to thrive: Secondary | ICD-10-CM | POA: Diagnosis not present

## 2022-05-19 DIAGNOSIS — R7881 Bacteremia: Secondary | ICD-10-CM | POA: Diagnosis not present

## 2022-05-19 LAB — GLUCOSE, CAPILLARY
Glucose-Capillary: 188 mg/dL — ABNORMAL HIGH (ref 70–99)
Glucose-Capillary: 224 mg/dL — ABNORMAL HIGH (ref 70–99)
Glucose-Capillary: 213 mg/dL — ABNORMAL HIGH (ref 70–99)
Glucose-Capillary: 248 mg/dL — ABNORMAL HIGH (ref 70–99)
Glucose-Capillary: 224 mg/dL — ABNORMAL HIGH (ref 70–99)
Glucose-Capillary: 201 mg/dL — ABNORMAL HIGH (ref 70–99)
Glucose-Capillary: 197 mg/dL — ABNORMAL HIGH (ref 70–99)

## 2022-05-19 LAB — CBC WITH DIFFERENTIAL/PLATELET
Abs Immature Granulocytes: 0.03 10*3/uL (ref 0.00–0.07)
Basophils Absolute: 0 10*3/uL (ref 0.0–0.1)
Basophils Relative: 0 %
Eosinophils Absolute: 0.2 10*3/uL (ref 0.0–0.5)
Eosinophils Relative: 2 %
HCT: 39.4 % (ref 39.0–52.0)
Hemoglobin: 12.8 g/dL — ABNORMAL LOW (ref 13.0–17.0)
Immature Granulocytes: 0 %
Lymphocytes Relative: 15 %
Lymphs Abs: 1.1 10*3/uL (ref 0.7–4.0)
MCH: 28.8 pg (ref 26.0–34.0)
MCHC: 32.5 g/dL (ref 30.0–36.0)
MCV: 88.7 fL (ref 80.0–100.0)
Monocytes Absolute: 0.5 10*3/uL (ref 0.1–1.0)
Monocytes Relative: 7 %
Neutro Abs: 5.8 10*3/uL (ref 1.7–7.7)
Neutrophils Relative %: 76 %
Platelets: 290 10*3/uL (ref 150–400)
RBC: 4.44 MIL/uL (ref 4.22–5.81)
RDW: 15.9 % — ABNORMAL HIGH (ref 11.5–15.5)
WBC: 7.7 10*3/uL (ref 4.0–10.5)
nRBC: 0 % (ref 0.0–0.2)

## 2022-05-19 LAB — COMPREHENSIVE METABOLIC PANEL
ALT: 55 U/L — ABNORMAL HIGH (ref 0–44)
AST: 27 U/L (ref 15–41)
Albumin: 2.1 g/dL — ABNORMAL LOW (ref 3.5–5.0)
Alkaline Phosphatase: 71 U/L (ref 38–126)
Anion gap: 9 (ref 5–15)
BUN: 58 mg/dL — ABNORMAL HIGH (ref 8–23)
CO2: 23 mmol/L (ref 22–32)
Calcium: 9.4 mg/dL (ref 8.9–10.3)
Chloride: 101 mmol/L (ref 98–111)
Creatinine, Ser: 0.99 mg/dL (ref 0.61–1.24)
GFR, Estimated: >60 mL/min (ref >60)
Glucose, Bld: 214 mg/dL — ABNORMAL HIGH (ref 70–99)
Potassium: 4.4 mmol/L (ref 3.5–5.1)
Sodium: 133 mmol/L — ABNORMAL LOW (ref 135–145)
Total Bilirubin: 0.4 mg/dL (ref 0.3–1.2)
Total Protein: 6.8 g/dL (ref 6.5–8.1)

## 2022-05-19 LAB — MAGNESIUM: Magnesium: 2.1 mg/dL (ref 1.7–2.4)

## 2022-05-19 MED ORDER — RIVAROXABAN 20 MG PO TABS
20.0000 mg | ORAL_TABLET | Freq: Every day | ORAL | Status: DC
  Administered 2022-05-19: 20 mg via ORAL
  Filled 2022-05-19 (×2): qty 1

## 2022-05-19 NOTE — Plan of Care (Signed)
  Problem: Education: Goal: Knowledge of General Education information will improve Description: Including pain rating scale, medication(s)/side effects and non-pharmacologic comfort measures Outcome: Progressing   Problem: Clinical Measurements: Goal: Will remain free from infection Outcome: Progressing Goal: Respiratory complications will improve Outcome: Progressing   Problem: Nutrition: Goal: Adequate nutrition will be maintained Outcome: Progressing   Problem: Safety: Goal: Ability to remain free from injury will improve Outcome: Progressing   

## 2022-05-19 NOTE — Progress Notes (Signed)
PROGRESS NOTE        PATIENT DETAILS Name: Christian [name] Age: 71 y.o. Sex: male Date of Birth: 09-Aug-1951 Admit Date: 03/30/2022 Admitting Physician Rise Patience, MD PCP:Sun, Gari Crown, MD  Brief Summary: Patient is a 71 y.o.  male with a history of A-fib on Xarelto, HFpEF, PAD, DM-2, bipolar disorder-presented with generalized weakness/sacral decubitus ulcer-was found to have hypoglycemia, hyperkalemia and subsequently admitted to the hospitalist service-see below for further details.  Thought to have extrapyramidal symptoms-evaluated by psych/neurology-psych medications adjusted with clinical improvement.  Unfortunately-further hospital course complicated by development of fever-due to probable Streptococcus bacteremia and infected decubitus ulcer.  See below for further details.  Significant events: 5/2>> admit to [facility] for weakness/hypoglycemia/hyperkalemia/rigidity-thought to have EPS 5/14>> febrile/tachycardic-Per nursing staff-discharge from prior existing sacral decubitus ulcer.  Started on broad-spectrum IV antibiotics.  Prelim blood culture positive for Streptococcus species. 5/19>> wound care starting hydrotherapy 6/05>>Cortak tube placed 6/07>>wound vac placed 6/14>> wound VAC removed   Significant studies: 5/3>> x-ray pelvis: No fracture. 5/3>> CT head: No acute intracranial findings. 5/3>> x-ray left shoulder: Rotator cuff impingement 5/14>> CXR:No Obvious PNA 5/15>> left upper extremity Doppler: No DVT. 5/16>> CT abdomen/pelvis: Inferior sacral decubitus ulceration-no evidence of osteomyelitis. 5/16>> Echo: EF 45-50%, no obvious vegetations.  Significant microbiology data: 5/14>> blood culture: Streptococcus constellatus  5/15>> blood culture: No growth  Procedures: PEG - by IR 05/18/22  Consults: Neurology, psychiatry, infectious disease,CCS  Subjective:  Patient in bed, appears comfortable, denies any headache, no fever, no chest  pain or pressure, no shortness of breath , no abdominal pain. No new focal weakness.   Objective: Vitals: Blood pressure 122/74, pulse 91, temperature 99 F (37.2 C), temperature source Axillary, resp. rate 20, height 6' (1.829 m), weight 136.1 kg, SpO2 92 %.   Exam:  Awake, No new F.N deficits, Normal affect, PEG tube in place, diffuse weakness all over but worse in bilateral lower extremities, wearing surgical boots in both lower extremities, Foley Kimberly.AT,PERRAL Supple Neck, No JVD,   Symmetrical Chest wall movement, Good air movement bilaterally, CTAB RRR,No Gallops, Rubs or new Murmurs,  +ve B.Sounds, Abd Soft, No tenderness,   No Cyanosis, Clubbing or edema    Assessment/Plan:  Rigidity/tremors/mildly elevated CK: Felt to be more of extrapyramidal syndrome rather than NMS at this point.  Managed with supportive care.  After neurology/psych evaluation-has been resumed on Abilify and Cogentin-Wellbutrin/Seroquel has been discontinued.  AKI on CKD stage IIIa: AKI likely hemodynamically mediated-improved-creatinine close to baseline.   Mild rhabdomyolysis: Nontraumatic-resolved.  Sepsis due to possible strep bacteremia-sacral decubitus ulcer with abscess:Sepsis physiology has resolved-has completed a course of antimicrobial therapy-completed hydrotherapy-wound care placed wound VAC on 05/05/22.    Failure to thrive syndrome/with poor nutrition Status: Dietitian following the patient, continue oral diet and protein supplements via PEG tube, PEG tube placed by IR on 05/17/2022.  Acute metabolic encephalopathy: Due to sepsis-has resolved-relatively awake and alert.    Hypothyroidism: Continue Cytomel.  PAF: Rate controlled-continue Xarelto   PAD: Follow-up with vascular surgery in the outpatient setting  HFpEF: Volume status stable-suspect has some amount of edema in lower extremities due to third spacing.  Demadex remains on hold.  Bipolar disorder: Given concerns for EPS-on  Abilify and Cogentin-Depakote/Wellbutrin and Seroquel held.  Left shoulder pain: Supportive care-likely rotator cuff injury.  Stable to be followed up by orthopedics in the  outpatient setting  Left arm swelling: Likely due to IVF infiltration-could have superficial venous thrombosis-left upper extremity Doppler negative for DVT.  Left arm swelling has resolved.  OSA: CPAP nightly  Acute urinary retention: Failed voiding trial on 6/5-continue Flomax-Foley catheter remains in place-we will plan on continuing Foley catheter to prevent soilage of sacral wound.  Debility/deconditioning: Has had worsening of his chronic debility during this hospitalization-plans are of SNF on discharge.  Per family-he has been nonambulatory since his discharge from [place] in April 2023.  This likely resulted in decubitus ulcer that the patient presented with.  Hypoglycemia: Resolved with supportive care.  Do not resume Amaryl on discharge.  DM-2 (A1c 7.6 on 4/15): Semglee dose adjusted on 05/18/2022 on sliding scale continue to monitor while n.p.o. gentle D5 drip.  CBG (last 3)  Recent Labs    05/18/22 1636 05/18/22 1948 05/19/22 0102  GLUCAP 214* 205* 188*      Nutrition Problem: Increased nutrient needs Etiology: chronic illness, wound healing Signs/Symptoms: estimated needs Interventions: Ensure Enlive (each supplement provides 350kcal and 20 grams of protein), MVI, Tube feeding, Prostat  Pressure Ulcer: Pressure Injury 03/31/22 Sacrum Mid Stage 4 - Full thickness tissue loss with exposed bone, tendon or muscle. (Active)  03/31/22 1933  Location: Sacrum  Location Orientation: Mid  Staging: Stage 4 - Full thickness tissue loss with exposed bone, tendon or muscle.  Wound Description (Comments):   Present on Admission: Yes  Dressing Type Gauze (Comment) 05/19/22 0200     Pressure Injury 05/08/22 Heel Right Deep Tissue Pressure Injury - Purple or maroon localized area of discolored intact skin  or blood-filled blister due to damage of underlying soft tissue from pressure and/or shear. (Active)  05/08/22 1600  Location: Heel  Location Orientation: Right  Staging: Deep Tissue Pressure Injury - Purple or maroon localized area of discolored intact skin or blood-filled blister due to damage of underlying soft tissue from pressure and/or shear.  Wound Description (Comments):   Present on Admission: No  Dressing Type Gauze (Comment);ABD 05/18/22 1400   Obesity: Estimated body mass index is 40.69 kg/m as calculated from the following:   Height as of this encounter: 6' (1.829 m).   Weight as of this encounter: 136.1 kg.   Code status:   Code Status: Full Code   DVT Prophylaxis:Xaralto/Lovenox    Family Communication: [name]-[phone]-updated 6/15  Disposition Plan: Status is: Inpatient  Planned Discharge Destination:Skilled nursing facility    Diet: Diet Order             Diet regular Room service appropriate? Yes with Assist; Fluid consistency: Thin  Diet effective now                    MEDICATIONS: Scheduled Meds:  ARIPiprazole  10 mg Per Tube q morning   atorvastatin  10 mg Per Tube Daily   benztropine  1 mg Per Tube BID   Chlorhexidine Gluconate Cloth  6 each Topical Daily   diclofenac Sodium  2 g Topical QID   enoxaparin (LOVENOX) injection  140 mg Subcutaneous BID   feeding supplement  237 mL Oral BID BM   feeding supplement (GLUCERNA 1.5 CAL)  1,000 mL Per Tube Q24H   feeding supplement (PROSource TF)  45 mL Per Tube TID   free water  150 mL Per Tube Q6H   insulin aspart  0-20 Units Subcutaneous Q4H   insulin glargine-yfgn  15 Units Subcutaneous BID   isosorbide mononitrate  30 mg  Oral Daily   liothyronine  10 mcg Per Tube Daily   mouth rinse  15 mL Mouth Rinse BID   metoprolol succinate  50 mg Oral Daily   multivitamin with minerals  1 tablet Oral Daily   nutrition supplement (JUVEN)  1 packet Oral BID BM   senna-docusate  2 tablet  Oral BID   tamsulosin  0.4 mg Oral Daily   Continuous Infusions:    PRN Meds:.acetaminophen **OR** acetaminophen, bisacodyl, fentaNYL (SUBLIMAZE) injection, HYDROcodone-acetaminophen, loperamide, melatonin   I have personally reviewed following labs and imaging studies  LABORATORY DATA:  Recent Labs  Lab 05/13/22 0329 05/16/22 0353 05/17/22 0837 05/18/22 0430 05/19/22 0043  WBC 7.8 7.8 7.6 9.5 7.7  HGB 12.6* 11.8* 12.3* 13.4 12.8*  HCT 38.9* 36.5* 38.2* 42.3 39.4  PLT 295 302 278 362 290  MCV 86.4 86.7 87.4 86.9 88.7  MCH 28.0 28.0 28.1 27.5 28.8  MCHC 32.4 32.3 32.2 31.7 32.5  RDW 16.4* 16.1* 15.8* 16.0* 15.9*  LYMPHSABS  --   --   --  1.2 1.1  MONOABS  --   --   --  0.7 0.5  EOSABS  --   --   --  0.1 0.2  BASOSABS  --   --   --  0.0 0.0    Recent Labs  Lab 05/13/22 0329 05/14/22 0652 05/15/22 0209 05/16/22 0353 05/17/22 0837 05/18/22 0430 05/19/22 0043  NA 130* 135 132* 134*  --  133* 133*  K 4.7 5.0 4.6 4.5  --  4.4 4.4  CL 97* 99 98 99  --  99 101  CO2 27 27 26 27   --  24 23  GLUCOSE 190* 191* 203* 196*  --  187* 214*  BUN 47* 47* 52* 48*  --  44* 58*  CREATININE 0.97 1.04 1.11 1.03  --  0.96 0.99  CALCIUM 9.3 9.8 9.2 9.6  --  9.7 9.4  AST  --   --  50*  --   --  40 27  ALT  --   --  63*  --   --  84* 55*  ALKPHOS  --   --  80  --   --  78 71  BILITOT  --   --  0.3  --   --  0.8 0.4  ALBUMIN  --   --  2.1*  --   --  2.3* 2.1*  MG  --   --  2.1 2.0  --  2.0 2.1  INR 1.4*  --   --   --  1.0  --   --     RADIOLOGY STUDIES/RESULTS: IR GASTROSTOMY TUBE MOD SED  Result Date: 05/17/2022 INDICATION: Dysphagia EXAM: 20 FRENCH PULL-THROUGH GASTROSTOMY Date:  05/17/2022 05/17/2022 2:57 pm Radiologist:  05/19/2022. Judie Petit, MD Guidance:  Ultrasound and fluoroscopic MEDICATIONS: 2 G ANCEF; Antibiotics were administered within 1 hour of the procedure. Glucagon 0.5 mg IV ANESTHESIA/SEDATION: Versed 1.5 mg IV; Fentanyl 50 mcg IV Moderate Sedation Time:  16 minutes The  patient was continuously monitored during the procedure by the interventional radiology nurse under my direct supervision. CONTRAST:  55mL OMNIPAQUE IOHEXOL 300 MG/ML SOLN - administered into the gastric lumen. FLUOROSCOPY: Fluoroscopy Time: 4 minutes 0 seconds (50 mGy). COMPLICATIONS: None immediate. PROCEDURE: Informed consent was obtained from the patient following explanation of the procedure, risks, benefits and alternatives. The patient understands, agrees and consents for the procedure. All questions were addressed. A time out was performed.  Maximal barrier sterile technique utilized including caps, mask, sterile gowns, sterile gloves, large sterile drape, hand hygiene, and betadine prep. The left upper quadrant was sterilely prepped and draped. An oral gastric catheter was inserted into the stomach under fluoroscopy. The existing nasogastric feeding tube was removed. Air was injected into the stomach for insufflation and visualization under fluoroscopy. The air distended stomach was confirmed beneath the anterior abdominal wall in the frontal and lateral projections. Under sterile conditions and local anesthesia, a 17 gauge trocar needle was utilized to access the stomach percutaneously beneath the left subcostal margin. Needle position was confirmed within the stomach under biplane fluoroscopy. Contrast injection confirmed position also. A single T tack was deployed for gastropexy. Over an Amplatz guide wire, a 9-French sheath was inserted into the stomach. A snare device was utilized to capture the oral gastric catheter. The snare device was pulled retrograde from the stomach up the esophagus and out the oropharynx. The 20-French pull-through gastrostomy was connected to the snare device and pulled antegrade through the oropharynx down the esophagus into the stomach and then through the percutaneous tract external to the patient. The gastrostomy was assembled externally. Contrast injection confirms  position in the stomach. Images were obtained for documentation. The patient tolerated procedure well. No immediate complication. IMPRESSION: Fluoroscopic insertion of a 20-French "pull-through" gastrostomy. Electronically Signed   By: Judie Petit.  Shick M.D.   On: 05/17/2022 15:49     LOS: 49 days   Signature  Susa Raring M.D on 05/19/2022 at 8:16 AM   -  To page go to [email or web address]

## 2022-05-19 NOTE — Progress Notes (Signed)
ANTICOAGULATION CONSULT NOTE - Follow Up Consult  Pharmacy Consult for Lovenox > Xarelto Indication: atrial fibrillation  Allergies  Allergen Reactions   Amlodipine Swelling and Other (See Comments)    Legs became swollen   Onglyza [Saxagliptin] Other (See Comments)    "Allergic," per [facility]   Zyprexa [Olanzapine] Other (See Comments)    Allergy noted by ACT Team. No reaction specified (??)- "Allergic," per [facility]   Ritalin [Methylphenidate] Anxiety and Other (See Comments)    "[place]"    Patient Measurements: Height: 6' (182.9 cm) Weight: 136.1 kg (300 lb) IBW/kg (Calculated) : 77.6 Lovenox Dosing Weight: 136 kg  Vital Signs: Temp: 98.6 F (37 C) (06/21 0825) Temp Source: Oral (06/21 0825) BP: 121/83 (06/21 0825) Pulse Rate: 112 (06/21 0825)  Labs: Recent Labs    05/17/22 0837 05/18/22 0430 05/19/22 0043  HGB 12.3* 13.4 12.8*  HCT 38.2* 42.3 39.4  PLT 278 362 290  LABPROT 13.2  --   --   INR 1.0  --   --   CREATININE  --  0.96 0.99     Estimated Creatinine Clearance: 102 mL/min (by C-G formula based on SCr of 0.99 mg/dL).  Assessment:  70 yr old male previously on Xarelto 20 mg daily for afib. Xarelto held on 6/16 and Lovenox bridge begun, in anticipation of PEG placement on 6/19. Lovenox held after 6/18 pm dose. S/p PEG placement on 6/19, Lovenox resumed on 6/20 am.  To transition back to Xarelto today. Last Lovenox dose given ~9pm on 6/20. On nocturnal tube feedings and Ensure BID. Able to take PO meds.  Goal of Therapy:  Anti-Xa level 0.6-1 units/ml 4hrs after LMWH dose given Appropriate Xarelto regimen for indication. Monitor platelets by anticoagulation protocol: Yes   Plan:  Discontinue Lovenox. Resume Xarelto 20 mg  daily with supper; give with lunch today only. Intermittent CBC. Monitor for signs/symptoms of bleeding.  Dennie Fetters, RPh 05/19/2022,8:56 AM

## 2022-05-19 NOTE — Progress Notes (Signed)
   05/19/22 0825  Assess: MEWS Score  Temp 98.6 F (37 C)  BP 121/83  MAP (mmHg) 95  Pulse Rate (!) 112  SpO2 98 %  Assess: MEWS Score  MEWS Temp 0  MEWS Systolic 0  MEWS Pulse 2  MEWS RR 0  MEWS LOC 0  MEWS Score 2  MEWS Score Color Yellow  Assess: if the MEWS score is Yellow or Red  Were vital signs taken at a resting state? Yes  Focused Assessment No change from prior assessment  Does the patient meet 2 or more of the SIRS criteria? No  Does the patient have a confirmed or suspected source of infection? Yes  Provider and Rapid Response Notified? No  MEWS guidelines implemented *See Row Information* No, vital signs rechecked  Assess: SIRS CRITERIA  SIRS Temperature  0  SIRS Pulse 1  SIRS Respirations  0  SIRS WBC 0  SIRS Score Sum  1

## 2022-05-19 NOTE — TOC Progression Note (Addendum)
Transition of Care [facility]) - Progression Note    Patient Details  Name: Christian [name] MRN: [id number] Date of Birth: 10/27/1951  Transition of [facility]) CM/SW Contact  Janae Bridgeman, RN Phone Number: 05/19/2022, 10:24 AM  Clinical Narrative:    CM called and spoke with Raven, MSW with PASSR Department and she plans to visit with the patient at the bedside today to reassign the patient an updated PASSR assignment.    I called and spoke with Revonda Standard, CM with [facility] and insurance authorization is still pending through [facility] - with likely approval by tomorrow - 6/22- but will follow and update attending physician once authorization is complete.    CM and MSW with DTP Team will continue to follow the patient for discharge planning.    Expected Discharge Plan: Skilled Nursing Facility Barriers to Discharge: Continued Medical Work up  Expected Discharge Plan and Services Expected Discharge Plan: Skilled Nursing Facility In-house Referral: Clinical Social Work Discharge Planning Services: CM Consult Post Acute Care Choice: Skilled Nursing Facility Living arrangements for the past 2 months:  [facility] in [place], [place] prior to SNF)                                       Social Determinants of Health (SDOH) Interventions    Readmission Risk Interventions    05/14/2022   12:39 PM 04/13/2022    3:06 PM 04/13/2022   10:43 AM  Readmission Risk Prevention Plan  Transportation Screening Complete  Complete  PCP or Specialist Appt within 3-5 Days  Complete   HRI or Home Care Consult  Complete   Social Work Consult for Recovery Care Planning/Counseling  Complete   Palliative Care Screening  Not Applicable   Medication Review Oceanographer) Complete Referral to Pharmacy Complete  PCP or Specialist appointment within 3-5 days of discharge Complete  Complete  HRI or Home Care Consult Complete  Complete  SW Recovery Care/Counseling Consult    Complete  Palliative Care Screening Not Applicable  Not Applicable  Skilled Nursing Facility Complete  Complete

## 2022-05-20 DIAGNOSIS — E1140 Type 2 diabetes mellitus with diabetic neuropathy, unspecified: Secondary | ICD-10-CM | POA: Diagnosis not present

## 2022-05-20 DIAGNOSIS — R1084 Generalized abdominal pain: Secondary | ICD-10-CM | POA: Diagnosis not present

## 2022-05-20 DIAGNOSIS — B370 Candidal stomatitis: Secondary | ICD-10-CM | POA: Diagnosis not present

## 2022-05-20 DIAGNOSIS — R6521 Severe sepsis with septic shock: Secondary | ICD-10-CM | POA: Diagnosis not present

## 2022-05-20 DIAGNOSIS — B964 Proteus (mirabilis) (morganii) as the cause of diseases classified elsewhere: Secondary | ICD-10-CM | POA: Diagnosis present

## 2022-05-20 DIAGNOSIS — Z7189 Other specified counseling: Secondary | ICD-10-CM | POA: Diagnosis not present

## 2022-05-20 DIAGNOSIS — R6520 Severe sepsis without septic shock: Secondary | ICD-10-CM | POA: Diagnosis not present

## 2022-05-20 DIAGNOSIS — E1151 Type 2 diabetes mellitus with diabetic peripheral angiopathy without gangrene: Secondary | ICD-10-CM | POA: Diagnosis present

## 2022-05-20 DIAGNOSIS — Z09 Encounter for follow-up examination after completed treatment for conditions other than malignant neoplasm: Secondary | ICD-10-CM | POA: Diagnosis not present

## 2022-05-20 DIAGNOSIS — M8618 Other acute osteomyelitis, other site: Secondary | ICD-10-CM | POA: Diagnosis not present

## 2022-05-20 DIAGNOSIS — R7881 Bacteremia: Secondary | ICD-10-CM | POA: Diagnosis not present

## 2022-05-20 DIAGNOSIS — E118 Type 2 diabetes mellitus with unspecified complications: Secondary | ICD-10-CM | POA: Diagnosis not present

## 2022-05-20 DIAGNOSIS — G9341 Metabolic encephalopathy: Secondary | ICD-10-CM | POA: Diagnosis not present

## 2022-05-20 DIAGNOSIS — N390 Urinary tract infection, site not specified: Secondary | ICD-10-CM | POA: Diagnosis not present

## 2022-05-20 DIAGNOSIS — I700 Atherosclerosis of aorta: Secondary | ICD-10-CM | POA: Diagnosis not present

## 2022-05-20 DIAGNOSIS — N179 Acute kidney failure, unspecified: Secondary | ICD-10-CM | POA: Diagnosis not present

## 2022-05-20 DIAGNOSIS — R109 Unspecified abdominal pain: Secondary | ICD-10-CM | POA: Diagnosis not present

## 2022-05-20 DIAGNOSIS — R5383 Other fatigue: Secondary | ICD-10-CM | POA: Diagnosis present

## 2022-05-20 DIAGNOSIS — I96 Gangrene, not elsewhere classified: Secondary | ICD-10-CM | POA: Diagnosis not present

## 2022-05-20 DIAGNOSIS — R531 Weakness: Secondary | ICD-10-CM | POA: Diagnosis not present

## 2022-05-20 DIAGNOSIS — E11649 Type 2 diabetes mellitus with hypoglycemia without coma: Secondary | ICD-10-CM | POA: Diagnosis not present

## 2022-05-20 DIAGNOSIS — D649 Anemia, unspecified: Secondary | ICD-10-CM | POA: Diagnosis not present

## 2022-05-20 DIAGNOSIS — E875 Hyperkalemia: Secondary | ICD-10-CM | POA: Diagnosis not present

## 2022-05-20 DIAGNOSIS — M19012 Primary osteoarthritis, left shoulder: Secondary | ICD-10-CM | POA: Diagnosis not present

## 2022-05-20 DIAGNOSIS — Z7401 Bed confinement status: Secondary | ICD-10-CM | POA: Diagnosis not present

## 2022-05-20 DIAGNOSIS — A419 Sepsis, unspecified organism: Secondary | ICD-10-CM | POA: Diagnosis not present

## 2022-05-20 DIAGNOSIS — T83511A Infection and inflammatory reaction due to indwelling urethral catheter, initial encounter: Secondary | ICD-10-CM | POA: Diagnosis not present

## 2022-05-20 DIAGNOSIS — I10 Essential (primary) hypertension: Secondary | ICD-10-CM | POA: Diagnosis not present

## 2022-05-20 DIAGNOSIS — I5032 Chronic diastolic (congestive) heart failure: Secondary | ICD-10-CM | POA: Diagnosis present

## 2022-05-20 DIAGNOSIS — I2510 Atherosclerotic heart disease of native coronary artery without angina pectoris: Secondary | ICD-10-CM | POA: Diagnosis not present

## 2022-05-20 DIAGNOSIS — L89154 Pressure ulcer of sacral region, stage 4: Secondary | ICD-10-CM | POA: Diagnosis not present

## 2022-05-20 DIAGNOSIS — E1169 Type 2 diabetes mellitus with other specified complication: Secondary | ICD-10-CM | POA: Diagnosis present

## 2022-05-20 DIAGNOSIS — I110 Hypertensive heart disease with heart failure: Secondary | ICD-10-CM | POA: Diagnosis present

## 2022-05-20 DIAGNOSIS — L22 Diaper dermatitis: Secondary | ICD-10-CM | POA: Diagnosis not present

## 2022-05-20 DIAGNOSIS — E162 Hypoglycemia, unspecified: Secondary | ICD-10-CM | POA: Diagnosis not present

## 2022-05-20 DIAGNOSIS — Z9989 Dependence on other enabling machines and devices: Secondary | ICD-10-CM | POA: Diagnosis not present

## 2022-05-20 DIAGNOSIS — N1831 Chronic kidney disease, stage 3a: Secondary | ICD-10-CM | POA: Diagnosis not present

## 2022-05-20 DIAGNOSIS — L89616 Pressure-induced deep tissue damage of right heel: Secondary | ICD-10-CM | POA: Diagnosis not present

## 2022-05-20 DIAGNOSIS — G2401 Drug induced subacute dyskinesia: Secondary | ICD-10-CM | POA: Diagnosis not present

## 2022-05-20 DIAGNOSIS — E1122 Type 2 diabetes mellitus with diabetic chronic kidney disease: Secondary | ICD-10-CM | POA: Diagnosis present

## 2022-05-20 DIAGNOSIS — R000 Tachycardia, unspecified: Secondary | ICD-10-CM | POA: Diagnosis not present

## 2022-05-20 DIAGNOSIS — I480 Paroxysmal atrial fibrillation: Secondary | ICD-10-CM | POA: Diagnosis not present

## 2022-05-20 DIAGNOSIS — G9340 Encephalopathy, unspecified: Secondary | ICD-10-CM | POA: Diagnosis not present

## 2022-05-20 DIAGNOSIS — F3132 Bipolar disorder, current episode depressed, moderate: Secondary | ICD-10-CM | POA: Diagnosis not present

## 2022-05-20 DIAGNOSIS — R5381 Other malaise: Secondary | ICD-10-CM | POA: Diagnosis not present

## 2022-05-20 DIAGNOSIS — I2782 Chronic pulmonary embolism: Secondary | ICD-10-CM | POA: Diagnosis not present

## 2022-05-20 DIAGNOSIS — E119 Type 2 diabetes mellitus without complications: Secondary | ICD-10-CM | POA: Diagnosis not present

## 2022-05-20 DIAGNOSIS — M109 Gout, unspecified: Secondary | ICD-10-CM | POA: Diagnosis present

## 2022-05-20 DIAGNOSIS — I5033 Acute on chronic diastolic (congestive) heart failure: Secondary | ICD-10-CM | POA: Diagnosis not present

## 2022-05-20 DIAGNOSIS — M869 Osteomyelitis, unspecified: Secondary | ICD-10-CM | POA: Diagnosis not present

## 2022-05-20 DIAGNOSIS — L89159 Pressure ulcer of sacral region, unspecified stage: Secondary | ICD-10-CM | POA: Diagnosis not present

## 2022-05-20 DIAGNOSIS — B954 Other streptococcus as the cause of diseases classified elsewhere: Secondary | ICD-10-CM | POA: Diagnosis not present

## 2022-05-20 DIAGNOSIS — Y846 Urinary catheterization as the cause of abnormal reaction of the patient, or of later complication, without mention of misadventure at the time of the procedure: Secondary | ICD-10-CM | POA: Diagnosis present

## 2022-05-20 DIAGNOSIS — A409 Streptococcal sepsis, unspecified: Secondary | ICD-10-CM | POA: Diagnosis not present

## 2022-05-20 DIAGNOSIS — F319 Bipolar disorder, unspecified: Secondary | ICD-10-CM | POA: Diagnosis not present

## 2022-05-20 DIAGNOSIS — R54 Age-related physical debility: Secondary | ICD-10-CM | POA: Diagnosis present

## 2022-05-20 DIAGNOSIS — M4628 Osteomyelitis of vertebra, sacral and sacrococcygeal region: Secondary | ICD-10-CM | POA: Diagnosis not present

## 2022-05-20 DIAGNOSIS — I491 Atrial premature depolarization: Secondary | ICD-10-CM | POA: Diagnosis not present

## 2022-05-20 DIAGNOSIS — R4182 Altered mental status, unspecified: Secondary | ICD-10-CM | POA: Diagnosis not present

## 2022-05-20 DIAGNOSIS — G478 Other sleep disorders: Secondary | ICD-10-CM | POA: Diagnosis not present

## 2022-05-20 DIAGNOSIS — M879 Osteonecrosis, unspecified: Secondary | ICD-10-CM | POA: Diagnosis present

## 2022-05-20 DIAGNOSIS — R1030 Lower abdominal pain, unspecified: Secondary | ICD-10-CM | POA: Diagnosis not present

## 2022-05-20 DIAGNOSIS — G4733 Obstructive sleep apnea (adult) (pediatric): Secondary | ICD-10-CM | POA: Diagnosis not present

## 2022-05-20 DIAGNOSIS — R404 Transient alteration of awareness: Secondary | ICD-10-CM | POA: Diagnosis not present

## 2022-05-20 DIAGNOSIS — Z515 Encounter for palliative care: Secondary | ICD-10-CM | POA: Diagnosis not present

## 2022-05-20 LAB — GLUCOSE, CAPILLARY
Glucose-Capillary: 234 mg/dL — ABNORMAL HIGH (ref 70–99)
Glucose-Capillary: 208 mg/dL — ABNORMAL HIGH (ref 70–99)
Glucose-Capillary: 280 mg/dL — ABNORMAL HIGH (ref 70–99)

## 2022-05-20 MED ORDER — INSULIN GLARGINE-YFGN 100 UNIT/ML ~~LOC~~ SOLN
15.0000 [IU] | Freq: Two times a day (BID) | SUBCUTANEOUS | 0 refills | Status: DC
Start: 2022-05-20 — End: 2022-06-03

## 2022-05-20 MED ORDER — FREE WATER: 150.0000 mL | Freq: Four times a day (QID) | Status: AC

## 2022-05-20 MED ORDER — GLUCERNA 1.5 CAL PO LIQD: 1000.0000 mL | ORAL | Status: DC

## 2022-05-20 MED ORDER — INSULIN ASPART 100 UNIT/ML FLEXPEN: PEN_INJECTOR | SUBCUTANEOUS | 0 refills | Status: AC

## 2022-05-20 MED ORDER — PROSOURCE TF PO LIQD: 45.0000 mL | Freq: Three times a day (TID) | ORAL | Status: AC

## 2022-05-20 MED ORDER — TAMSULOSIN HCL 0.4 MG PO CAPS: 0.4000 mg | ORAL_CAPSULE | Freq: Every day | ORAL | Status: AC

## 2022-05-20 NOTE — Discharge Summary (Signed)
Christian [name] [id number] DOB: [birth year] DOA: 03/30/2022  PCP: Donald Prose, MD  Admit date: 03/30/2022  Discharge date: 05/20/2022  Admitted From: Home   Disposition:  SNF   Recommendations for Outpatient Follow-up:   Follow up with PCP in 1-2 weeks  PCP Please obtain BMP/CBC, 2 view CXR in 1week,  (see Discharge instructions)   PCP Please follow up on the following pending results: Wound care per nursing home protocol.  Needs outpatient neurology follow-up for indwelling Foley catheter.   Home Health: None   Equipment/Devices: None  Consultations: Neurology, psychiatry, infectious disease,CCS, IR Discharge Condition: Stable   CODE STATUS: Full    Diet Recommendation: Heart Healthy Low Carb    Chief Complaint  Patient presents with   Weakness     Brief history of present illness from the day of admission and additional interim summary    71 y.o.  male with a history of A-fib on Xarelto, HFpEF, PAD, DM-2, bipolar disorder-presented with generalized weakness/sacral decubitus ulcer-was found to have hypoglycemia, hyperkalemia and subsequently admitted to the hospitalist service-see below for further details.  Thought to have extrapyramidal symptoms-evaluated by psych/neurology-psych medications adjusted with clinical improvement.  Unfortunately-further hospital course complicated by development of fever-due to probable Streptococcus bacteremia and infected decubitus ulcer.  See below for further details.   Significant events: 5/2>> admit to [facility] for weakness/hypoglycemia/hyperkalemia/rigidity-thought to have EPS 5/14>> febrile/tachycardic-Per nursing staff-discharge from prior existing sacral decubitus ulcer.  Started on broad-spectrum IV antibiotics.  Prelim blood culture positive for Streptococcus  species. 5/19>> wound care starting hydrotherapy 6/05>>Cortak tube placed 6/07>>wound vac placed 6/14>> wound VAC removed     Significant studies: 5/3>> x-ray pelvis: No fracture. 5/3>> CT head: No acute intracranial findings. 5/3>> x-ray left shoulder: Rotator cuff impingement 5/14>> CXR:No Obvious PNA 5/15>> left upper extremity Doppler: No DVT. 5/16>> CT abdomen/pelvis: Inferior sacral decubitus ulceration-no evidence of osteomyelitis. 5/16>> Echo: EF 45-50%, no obvious vegetations.   Significant microbiology data: 5/14>> blood culture: Streptococcus constellatus  5/15>> blood culture: No growth   Procedures: PEG - by IR 05/18/22                                                                 Hospital Course   Rigidity/tremors/mildly elevated CK: Felt to be more of extrapyramidal syndrome rather than NMS at this point.  Managed with supportive care.  After neurology/psych evaluation-has been resumed on Abilify and Cogentin-Wellbutrin/Seroquel has been discontinued.   AKI on CKD stage IIIa: AKI likely hemodynamically mediated-improved-creatinine close to baseline.    Mild rhabdomyolysis: Nontraumatic-resolved.   Sepsis due to possible strep bacteremia-sacral decubitus ulcer with abscess:Sepsis physiology has resolved-has completed a course of antimicrobial therapy-completed hydrotherapy-.  Continue wound care per SNF protocol.  Failure to thrive syndrome/with poor nutrition Status: Dietitian following the patient, continue oral  diet and protein supplements via PEG tube, PEG tube placed by IR on 05/17/2022.  On oral diet along with supplemental tube feeds.   Acute metabolic encephalopathy: Due to sepsis-has resolved-relatively awake and alert.     Hypothyroidism: Continue Cytomel.   PAF: Rate controlled-continue Xarelto    PAD: Follow-up with vascular surgery in the outpatient setting   HFpEF: Volume status stable-suspect has some amount of edema in lower extremities due to  third spacing.  Demadex remains on hold.  Assess volume status at SNF and use as needed diuretics as needed.  Bipolar disorder: Given concerns for EPS-on Abilify and Cogentin-  Depakote/Wellbutrin and Seroquel held.   Left shoulder pain: Supportive care-likely rotator cuff injury.  Stable to be followed up by orthopedics in the outpatient setting   Left arm swelling: Likely due to IVF infiltration-could have superficial venous thrombosis-left upper extremity Doppler negative for DVT.  Left arm swelling has resolved.   OSA: CPAP nightly   Acute urinary retention: Failed voiding trial on 6/5-continue Flomax-Foley catheter remains in place-we will plan on continuing Foley catheter to prevent soilage of sacral wound.  He failed Foley removal here he will go with indwelling Foley catheter and Flomax, request SNF staff to arrange for urology follow-up in 1 to 2 weeks.   Debility/deconditioning: Has had worsening of his chronic debility during this hospitalization-plans are of SNF on discharge.  Per family-he has been nonambulatory since his discharge from [place] in April 2023.  This likely resulted in decubitus ulcer that the patient presented with.   Hypoglycemia: Resolved with supportive care.  Do not resume Amaryl on discharge.   DM-2 (A1c 7.6 on 4/15): Semglee dose adjusted on 05/18/2022 on sliding scale continue to monitor CBGs q. ACH S at SNF and adjust as needed.   CBG (last 3)  Recent Labs    05/19/22 2311 05/20/22 0436 05/20/22 0750  GLUCAP 197* 234* 208*     Discharge diagnosis     Principal Problem:   Hyperkalemia Active Problems:   Bacteremia   Diabetes mellitus type 2 in nonobese (HCC)   Sacral decubitus ulcer, stage II (HCC)   Severe sepsis without septic shock (HCC)   Hypoglycemia    Discharge instructions    Discharge Instructions     Diet - low sodium heart healthy   Complete by: As directed    Discharge instructions   Complete by: As directed     Follow with Primary MD [name], MD in 7 days   Get CBC, CMP, 2 view Chest X ray -  checked next visit within 1 week by  SNF MD   Activity: As tolerated with Full fall precautions use walker/cane & assistance as needed  Disposition SNF  Diet: Heart Healthy with feeding assistance and aspiration precautions.    Special Instructions: If you have smoked or chewed Tobacco  in the last 2 yrs please stop smoking, stop any regular Alcohol  and or any Recreational drug use.  On your next visit with your primary care physician please Get Medicines reviewed and adjusted.  Please request your Prim.MD to go over all Hospital Tests and Procedure/Radiological results at the follow up, please get all Hospital records sent to your Prim MD by signing hospital release before you go home.  If you experience worsening of your admission symptoms, develop shortness of breath, life threatening emergency, suicidal or homicidal thoughts you must seek medical attention immediately by calling 911 or calling your MD immediately  if symptoms less  severe.  You Must read complete instructions/literature along with all the possible adverse reactions/side effects for all the Medicines you take and that have been prescribed to you. Take any new Medicines after you have completely understood and accpet all the possible adverse reactions/side effects.   Discharge instructions   Complete by: As directed    Follow with Primary MD Donald Prose, MD in 7 days   Get CBC, CMP, 2 view Chest X ray -  checked next visit within 1 week by  SNF MD   Activity: As tolerated with Full fall precautions use walker/cane & assistance as needed  Disposition SNF  Diet: Heart Healthy low carbohydrate diet with feeding assistance and aspiration precautions.  Check CBGs q. [place].     Special Instructions: If you have smoked or chewed Tobacco  in the last 2 yrs please stop smoking, stop any regular Alcohol  and or any Recreational drug  use.  On your next visit with your primary care physician please Get Medicines reviewed and adjusted.  Please request your Prim.MD to go over all Hospital Tests and Procedure/Radiological results at the follow up, please get all Hospital records sent to your Prim MD by signing hospital release before you go home.  If you experience worsening of your admission symptoms, develop shortness of breath, life threatening emergency, suicidal or homicidal thoughts you must seek medical attention immediately by calling 911 or calling your MD immediately  if symptoms less severe.  You Must read complete instructions/literature along with all the possible adverse reactions/side effects for all the Medicines you take and that have been prescribed to you. Take any new Medicines after you have completely understood and accpet all the possible adverse reactions/side effects.   Discharge wound care:   Complete by: As directed    Every shift      Comments: Tuck kerlix moistened with saline or sterile water into the sacral wound. Be sure to fill the area going towards the anus and left buttock. Cover with ABD pad, tape in place. Change twice daily and prn soilage.  05/12/22 0923     04/21/22 0500    PT hydrotherapy  Daily    Comments: M-W-F   Increase activity slowly   Complete by: As directed        Discharge Medications   Allergies as of 05/20/2022       Reactions   Amlodipine Swelling, Other (See Comments)   Legs became swollen   Onglyza [saxagliptin] Other (See Comments)   "Allergic," per [place]   Zyprexa [olanzapine] Other (See Comments)   Allergy noted by ACT Team. No reaction specified (??)- "Allergic," per [place]   Ritalin [methylphenidate] Anxiety, Other (See Comments)   "[place]"        Medication List     STOP taking these medications    buPROPion 75 MG tablet Commonly known as: WELLBUTRIN   divalproex 500 MG 24 hr tablet Commonly known as: DEPAKOTE  ER   empagliflozin 10 MG Tabs tablet Commonly known as: JARDIANCE   glimepiride 2 MG tablet Commonly known as: AMARYL   insulin glargine 100 UNIT/ML injection Commonly known as: LANTUS   lisinopril 20 MG tablet Commonly known as: ZESTRIL   mirtazapine 15 MG tablet Commonly known as: REMERON   QUEtiapine 25 MG tablet Commonly known as: SEROQUEL   QUEtiapine 50 MG tablet Commonly known as: SEROQUEL   torsemide 20 MG tablet Commonly known as: DEMADEX  TAKE these medications    acetaminophen 500 MG tablet Commonly known as: TYLENOL Take 1,000 mg by mouth daily as needed (pain.).   ARIPiprazole 10 MG tablet Commonly known as: ABILIFY Take 1 tablet (10 mg total) by mouth every morning. What changed:  medication strength how much to take when to take this   atorvastatin 10 MG tablet Commonly known as: LIPITOR Take 1 tablet (10 mg total) by mouth daily at 6 PM. What changed: when to take this   benztropine 1 MG tablet Commonly known as: COGENTIN Take 1 tablet (1 mg total) by mouth 2 (two) times daily.   bisacodyl 5 MG EC tablet Commonly known as: DULCOLAX Take 2 tablets (10 mg total) by mouth daily as needed for moderate constipation.   feeding supplement (GLUCERNA 1.5 CAL) Liqd Place 1,000 mLs into feeding tube daily.   feeding supplement (PROSource TF) liquid Place 45 mLs into feeding tube 3 (three) times daily.   free water Soln Place 150 mLs into feeding tube every 6 (six) hours.   HYDROcodone-acetaminophen 5-325 MG tablet Commonly known as: NORCO/VICODIN Take 1 tablet by mouth every 6 (six) hours as needed for up to 4 doses for moderate pain.   insulin aspart 100 UNIT/ML FlexPen Commonly known as: NOVOLOG Before each meal 3 times a day, 140-199 - 2 units, 200-250 - 4 units, 251-299 - 6 units,  300-349 - 8 units,  350 or above 10 units. Insulin PEN if approved, provide syringes and needles if needed.   insulin glargine-yfgn 100 UNIT/ML  injection Commonly known as: SEMGLEE Inject 0.15 mLs (15 Units total) into the skin 2 (two) times daily.   liothyronine 5 MCG tablet Commonly known as: CYTOMEL Take 10 mcg by mouth daily.   Melatonin 10 MG Tabs Take 10 mg by mouth at bedtime as needed (sleep).   metoprolol succinate 50 MG 24 hr tablet Commonly known as: TOPROL-XL Take 1 tablet (50 mg total) by mouth daily. Take with or immediately following a meal. What changed: additional instructions   polyethylene glycol powder 17 GM/SCOOP powder Commonly known as: GLYCOLAX/MIRALAX Take 17 g by mouth daily. What changed:  when to take this reasons to take this   rivaroxaban 20 MG Tabs tablet Commonly known as: XARELTO Take 1 tablet (20 mg total) by mouth daily with supper. What changed: when to take this   senna-docusate 8.6-50 MG tablet Commonly known as: Senokot-S Take 2 tablets by mouth 2 (two) times daily.   tamsulosin 0.4 MG Caps capsule Commonly known as: FLOMAX Take 1 capsule (0.4 mg total) by mouth daily. Start taking on: May 21, 2022   Vitamin D (Ergocalciferol) 1.25 MG (50000 UNIT) Caps capsule Commonly known as: DRISDOL Take 50,000 Units by mouth every 7 (seven) days.               Discharge Care Instructions  (From admission, onward)           Start     Ordered   05/20/22 0000  Discharge wound care:       Comments: Every shift      Comments: Tuck kerlix moistened with saline or sterile water into the sacral wound. Be sure to fill the area going towards the anus and left buttock. Cover with ABD pad, tape in place. Change twice daily and prn soilage.  05/12/22 0923     04/21/22 0500    PT hydrotherapy  Daily    Comments: M-W-F   05/20/22 1019  Follow-up Information     Donald Prose, MD Follow up in 1 week(s).   Specialty: Family Medicine Contact information: [address] [place] [address] [phone]         Alexis Frock, MD. Schedule  an appointment as soon as possible for a visit in 1 week(s).   Specialty: Urology Why: Urinary retention with Foley catheter Contact information: [place] [address] [phone]                 Major procedures and Radiology Reports - PLEASE review detailed and final reports thoroughly  -       DG Chest Port 1 View  Result Date: 05/19/2022 CLINICAL DATA:  Shortness of breath EXAM: PORTABLE CHEST 1 VIEW COMPARISON:  04/18/2022 FINDINGS: Low lung volumes. No consolidation or edema. No pleural effusion or pneumothorax. Cardiomediastinal contours are within normal limits. IMPRESSION: No acute process in the chest. Electronically Signed   By: Macy Mis M.D.   On: 05/19/2022 08:36   IR GASTROSTOMY TUBE MOD SED  Result Date: 05/17/2022 INDICATION: Dysphagia EXAM: [place] Date:  05/17/2022 05/17/2022 2:57 pm Radiologist:  M. Daryll Brod, MD Guidance:  Ultrasound and fluoroscopic MEDICATIONS: 2 G ANCEF; Antibiotics were administered within 1 hour of the procedure. Glucagon 0.5 mg IV ANESTHESIA/SEDATION: Versed 1.5 mg IV; Fentanyl 50 mcg IV Moderate Sedation Time:  16 minutes The patient was continuously monitored during the procedure by the interventional radiology nurse under my direct supervision. CONTRAST:  49mL OMNIPAQUE IOHEXOL 300 MG/ML SOLN - administered into the gastric lumen. FLUOROSCOPY: Fluoroscopy Time: 4 minutes 0 seconds (50 mGy). COMPLICATIONS: None immediate. PROCEDURE: Informed consent was obtained from the patient following explanation of the procedure, risks, benefits and alternatives. The patient understands, agrees and consents for the procedure. All questions were addressed. A time out was performed. Maximal barrier sterile technique utilized including caps, mask, sterile gowns, sterile gloves, large sterile drape, hand hygiene, and betadine prep. The left upper quadrant was sterilely prepped and draped. An oral gastric catheter  was inserted into the stomach under fluoroscopy. The existing nasogastric feeding tube was removed. Air was injected into the stomach for insufflation and visualization under fluoroscopy. The air distended stomach was confirmed beneath the anterior abdominal wall in the frontal and lateral projections. Under sterile conditions and local anesthesia, a 51 gauge trocar needle was utilized to access the stomach percutaneously beneath the left subcostal margin. Needle position was confirmed within the stomach under biplane fluoroscopy. Contrast injection confirmed position also. A single T tack was deployed for gastropexy. Over an Amplatz guide wire, a 9-French sheath was inserted into the stomach. A snare device was utilized to capture the oral gastric catheter. The snare device was pulled retrograde from the stomach up the esophagus and out the oropharynx. The 20-French pull-through gastrostomy was connected to the snare device and pulled antegrade through the oropharynx down the esophagus into the stomach and then through the percutaneous tract external to the patient. The gastrostomy was assembled externally. Contrast injection confirms position in the stomach. Images were obtained for documentation. The patient tolerated procedure well. No immediate complication. IMPRESSION: Fluoroscopic insertion of a 20-French "pull-through" gastrostomy. Electronically Signed   By: Jerilynn Mages.  Shick M.D.   On: 05/17/2022 15:49   DG Abd Portable 1V  Result Date: 05/03/2022 CLINICAL DATA:  Feeding tube placement EXAM: PORTABLE ABDOMEN - 1 VIEW COMPARISON:  04/13/2022 FINDINGS: Limited radiograph of the upper abdomen was obtained for the purposes  of enteric tube localization. Enteric tube is seen coursing below the diaphragm with distal tip terminating within the expected location of the gastric body. IMPRESSION: Enteric tube tip terminating within the gastric body. Electronically Signed   By: Duanne Guess D.O.   On: 05/03/2022 16:30    ECHO TEE  Result Date: 04/21/2022    TRANSESOPHOGEAL ECHO REPORT   Patient Name:   Christian [name] Date of Exam: 04/21/2022 Medical Rec #:  [id number]        Height:       72.0 in Accession #:    [id number]       Weight:       242.1 lb Date of Birth:  20-Nov-1951        BSA:          2.311 m Patient Age:    70 years         BP:           143/84 mmHg Patient Gender: M                HR:           66 bpm. Exam Location:  Inpatient Procedure: 3D Echo, Transesophageal Echo, Cardiac Doppler and Color Doppler Indications:     Bacteremia  History:         Patient has prior history of Echocardiogram examinations, most                  recent 04/13/2022. Previous Myocardial Infarction, Abnormal ECG,                  Arrythmias:Atrial Flutter, Signs/Symptoms:Altered Mental                  Status, Syncope, Shortness of Breath and Dyspnea; Risk                  Factors:Sleep Apnea, Dyslipidemia and Hypertension. Shock. CKD.  Sonographer:     Sheralyn Boatman RDCS Referring Phys:  [id number] Jonita Albee Diagnosing Phys: Epifanio Lesches MD PROCEDURE: After discussion of the risks and benefits of a TEE, an informed consent was obtained from the patient. The transesophogeal probe was passed without difficulty through the esophogus of the patient. Imaged were obtained with the patient in a left lateral decubitus position. Sedation performed by different physician. The patient was monitored while under deep sedation. Anesthestetic sedation was provided intravenously by Anesthesiology: 373mg  of Propofol, 100mg  of Lidocaine. The patient's vital signs; including heart rate, blood pressure, and oxygen saturation; remained stable throughout the procedure. The patient developed no complications during the procedure. IMPRESSIONS  1. Left ventricular ejection fraction, by estimation, is 50 to 55%. The left ventricle has low normal function.  2. Right ventricular systolic function is normal. The right ventricular size is normal.  3.  Left atrial size was mildly dilated. No left atrial/left atrial appendage thrombus was detected.  4. The mitral valve is degenerative. Trivial mitral valve regurgitation.  5. The aortic valve is calcified. There is moderate calcification of the aortic valve. Aortic valve regurgitation is not visualized. Aortic valve sclerosis/calcification is present, without any evidence of aortic stenosis. Conclusion(s)/Recommendation(s): No evidence of vegetation/infective endocarditis on this transesophageael echocardiogram. FINDINGS  Left Ventricle: Left ventricular ejection fraction, by estimation, is 50 to 55%. The left ventricle has low normal function. The left ventricular internal cavity size was normal in size. Right Ventricle: The right ventricular size is normal. No increase in right ventricular wall thickness. Right ventricular  systolic function is normal. Left Atrium: Left atrial size was mildly dilated. No left atrial/left atrial appendage thrombus was detected. Right Atrium: Right atrial size was normal in size. Pericardium: Trivial pericardial effusion is present. Mitral Valve: The mitral valve is degenerative in appearance. Trivial mitral valve regurgitation. Tricuspid Valve: The tricuspid valve is normal in structure. Tricuspid valve regurgitation is trivial. Aortic Valve: The aortic valve is calcified. There is moderate calcification of the aortic valve. Aortic valve regurgitation is not visualized. Aortic valve sclerosis/calcification is present, without any evidence of aortic stenosis. Pulmonic Valve: The pulmonic valve was normal in structure. Pulmonic valve regurgitation is not visualized. Aorta: The aortic root is normal in size and structure. IAS/Shunts: No atrial level shunt detected by color flow Doppler.   3D Volume EF LV 3D EDV:   119.81 ml LV 3D ESV:   61.66 ml [name] MD Electronically signed by [name] MD Signature Date/Time: 04/21/2022/3:19:22 PM    Final        Today    Subjective    Shemar Lunney today has no headache,no chest abdominal pain,no new weakness tingling or numbness, feels much better     Objective   Blood pressure (!) 147/94, pulse (!) 103, temperature 98.3 F (36.8 C), temperature source Oral, resp. rate 16, height 6' (1.829 m), weight 136.1 kg, SpO2 91 %.   Intake/Output Summary (Last 24 hours) at 05/20/2022 1020 Last data filed at 05/20/2022 0915 Gross per 24 hour  Intake 1620 ml  Output 2200 ml  Net -580 ml    Exam   Awake, No new F.N deficits, Normal affect, PEG tube in place, diffuse weakness all over but worse in bilateral lower extremities, wearing surgical boots in both lower extremities, Foley [place].AT,PERRAL Supple Neck, No JVD,   Symmetrical Chest wall movement, Good air movement bilaterally, CTAB RRR,No Gallops, Rubs or new Murmurs,  +ve B.Sounds, Abd Soft, No tenderness,   No Cyanosis, Clubbing or edema     Data Review   Recent Labs  Lab 05/16/22 0353 05/17/22 0837 05/18/22 0430 05/19/22 0043  WBC 7.8 7.6 9.5 7.7  HGB 11.8* 12.3* 13.4 12.8*  HCT 36.5* 38.2* 42.3 39.4  PLT 302 278 362 290  MCV 86.7 87.4 86.9 88.7  MCH 28.0 28.1 27.5 28.8  MCHC 32.3 32.2 31.7 32.5  RDW 16.1* 15.8* 16.0* 15.9*  LYMPHSABS  --   --  1.2 1.1  MONOABS  --   --  0.7 0.5  EOSABS  --   --  0.1 0.2  BASOSABS  --   --  0.0 0.0    Recent Labs  Lab 05/14/22 0652 05/15/22 0209 05/16/22 0353 05/17/22 0837 05/18/22 0430 05/19/22 0043  NA 135 132* 134*  --  133* 133*  K 5.0 4.6 4.5  --  4.4 4.4  CL 99 98 99  --  99 101  CO2 27 26 27   --  24 23  GLUCOSE 191* 203* 196*  --  187* 214*  BUN 47* 52* 48*  --  44* 58*  CREATININE 1.04 1.11 1.03  --  0.96 0.99  CALCIUM 9.8 9.2 9.6  --  9.7 9.4  AST  --  50*  --   --  40 27  ALT  --  63*  --   --  84* 55*  ALKPHOS  --  80  --   --  78 71  BILITOT  --  0.3  --   --  0.8 0.4  ALBUMIN  --  2.1*  --   --  2.3* 2.1*  MG  --  2.1 2.0  --  2.0 2.1  INR  --   --   --  1.0  --   --      Total Time in preparing paper work, data evaluation and todays exam - 35 minutes  Lala Lund M.D on 05/20/2022 at 10:20 AM  [facility]

## 2022-05-20 NOTE — Progress Notes (Signed)
Occupational Therapy Treatment Patient Details Name: Christian [name] MRN: [id number] DOB: 18-Mar-1951 Today's Date: 05/20/2022   History of present illness Patient is a 71 y.o.  male presented with generalized weakness/sacral decubitus ulcer-was found to have hypoglycemia, hyperkalemia. Thought to have extrapyramidal symptoms-evaluated by psych/neurology-psych medications adjusted with clinical improvement.  Hospital course complicated by development of fever-due to probable Streptococcus bacteremia and infected decubitus ulcer. 5/19: wound care starting hydrotherapy. 6/05: [place] tube placed  6/07: wound vac placed, hydro DC. 6/14: wound vac removed. PMH: A-fib on Xarelto, HFpEF, PAD, DM-2, bipolar disorder.   OT comments  Focus of session on UE A/AROM, self feeding with R hand and bed level mobility. Pt with gradual improvement in UB strength for using call button and to self feed. Continues to need +2 mod to max assist for bed mobility for repositioning. Placed call for pt to talk to his daughter, [name], about discharge plan.    Recommendations for follow up therapy are one component of a multi-disciplinary discharge planning process, led by the attending physician.  Recommendations may be updated based on patient status, additional functional criteria and insurance authorization.    Follow Up Recommendations  Skilled nursing-short term rehab (<3 hours/day)    Assistance Recommended at Discharge Frequent or constant Supervision/Assistance  Patient can return home with the following  Two people to help with walking and/or transfers;Two people to help with bathing/dressing/bathroom;Assistance with feeding;Assistance with cooking/housework;Direct supervision/assist for medications management;Direct supervision/assist for financial management;Assist for transportation;Help with stairs or ramp for entrance   Equipment Recommendations  Other (comment) (defer to next venue)    Recommendations for  Other Services      Precautions / Restrictions Precautions Precautions: Fall Precaution Comments: monitor HR, G-tube, Stage IV decubitus ulcer and R heel deep tissue pressure injury Restrictions Weight Bearing Restrictions: No       Mobility Bed Mobility Overal bed mobility: Needs Assistance Bed Mobility: Rolling Rolling: +2 for physical assistance, Mod assist, Max assist              Transfers                         Balance                                           ADL either performed or assessed with clinical judgement   ADL Overall ADL's : Needs assistance/impaired Eating/Feeding: Set up;Bed level Eating/Feeding Details (indicate cue type and reason): pt not interested in eating, but able to drink from cup with straw with set up                                        Extremity/Trunk Assessment              Vision       Perception     Praxis      Cognition Arousal/Alertness: Awake/alert Behavior During Therapy: Flat affect Overall Cognitive Status: Impaired/Different from baseline Area of Impairment: Attention, Following commands, Problem solving, Memory                   Current Attention Level: Sustained Memory: Decreased short-term memory Following Commands: Follows one step commands with increased time     Problem Solving: Slow processing,  Decreased initiation, Difficulty sequencing, Requires verbal cues, Requires tactile cues General Comments: making requests to call his daughter and for his phone to be placed nearby        Exercises General Exercises - Upper Extremity Shoulder ABduction: AAROM, Both, 10 reps, Supine Elbow Flexion: AROM, Both, 10 reps, Supine Elbow Extension: AROM, Both, 10 reps, Sidelying Digit Composite Flexion: AROM, Both, 5 reps Composite Extension: AAROM, Both, 5 reps    Shoulder Instructions       General Comments      Pertinent Vitals/ Pain       Pain  Assessment Pain Assessment: Faces Faces Pain Scale: Hurts a little bit Pain Location: buttocks Pain Descriptors / Indicators: Discomfort, Guarding, Grimacing Pain Intervention(s): Repositioned  Home Living                                          Prior Functioning/Environment              Frequency  Min 2X/week        Progress Toward Goals  OT Goals(current goals can now be found in the care plan section)  Progress towards OT goals: Progressing toward goals  Acute Rehab OT Goals OT Goal Formulation: With patient Time For Goal Achievement: 05/20/22 Potential to Achieve Goals: Fair  Plan Discharge plan remains appropriate    Co-evaluation                 AM-PAC OT "6 Clicks" Daily Activity     Outcome Measure   Help from another person eating meals?: A Little Help from another person taking care of personal grooming?: A Lot Help from another person toileting, which includes using toliet, bedpan, or urinal?: Total Help from another person bathing (including washing, rinsing, drying)?: Total Help from another person to put on and taking off regular upper body clothing?: Total Help from another person to put on and taking off regular lower body clothing?: Total 6 Click Score: 9    End of Session    OT Visit Diagnosis: Muscle weakness (generalized) (M62.81);Pain   Activity Tolerance Patient tolerated treatment well   Patient Left in bed;with call bell/phone within reach   Nurse Communication          Time: 1200-1222 OT Time Calculation (min): 22 min  Charges: OT General Charges $OT Visit: 1 Visit OT Treatments $Therapeutic Exercise: 8-22 mins  [name], OTR/L Acute Rehabilitation Services Office: [phone]  Evern Bio 05/20/2022, 1:02 PM

## 2022-05-20 NOTE — Discharge Instructions (Signed)
Follow with Primary MD [name], MD in 7 days   Get CBC, CMP, 2 view Chest X ray -  checked next visit within 1 week by  SNF MD   Activity: As tolerated with Full fall precautions use walker/cane & assistance as needed  Disposition SNF  Diet: Heart Healthy with feeding assistance and aspiration precautions.    Special Instructions: If you have smoked or chewed Tobacco  in the last 2 yrs please stop smoking, stop any regular Alcohol  and or any Recreational drug use.  On your next visit with your primary care physician please Get Medicines reviewed and adjusted.  Please request your Prim.MD to go over all Hospital Tests and Procedure/Radiological results at the follow up, please get all Hospital records sent to your Prim MD by signing hospital release before you go home.  If you experience worsening of your admission symptoms, develop shortness of breath, life threatening emergency, suicidal or homicidal thoughts you must seek medical attention immediately by calling 911 or calling your MD immediately  if symptoms less severe.  You Must read complete instructions/literature along with all the possible adverse reactions/side effects for all the Medicines you take and that have been prescribed to you. Take any new Medicines after you have completely understood and accpet all the possible adverse reactions/side effects.

## 2022-05-20 NOTE — Progress Notes (Signed)
Patient discharged to [facility] as ordered, reported to nurse Judeth Cornfield and was transported via [place]. All belongings sent with patient.

## 2022-05-20 NOTE — TOC Progression Note (Signed)
Transition of Care [facility]) - Progression Note    Patient Details  Name: Christian [name] MRN: [id number] Date of Birth: [birth year]  Transition of Care [facility]) CM/SW Contact  Christian Bridgeman, RN Phone Number: 05/20/2022, 9:13 AM  Clinical Narrative:    CM called and spoke with Christian [name], Admissions director with [name] and insurance authorization is still pending at this time.  Insurance authorization was started by the facility and will need to be completed before patient can transfer to the facility.  Late entry for 05/19/2022 1400 - I called and spoke with the patient's Legal guardian, Christian [name], and explained that the receiving SNF facility will need to switch the patient's existing QMB Medicaid to LTC Medicaid once the patient is admitted at the facility.  I gave contact information to the patient's daughter, Christian [name] yesterday so that she could speak with the business office manager at the facility and update regarding Medicaid process.  CM and MSW with DTP Team will continue to follow the patient for admission to [facility] facility once insurance authorization is complete.   Expected Discharge Plan: Skilled Nursing Facility Barriers to Discharge: Continued Medical Work up  Expected Discharge Plan and Services Expected Discharge Plan: Skilled Nursing Facility In-house Referral: Clinical Social Work Discharge Planning Services: CM Consult Post Acute Care Choice: Skilled Nursing Facility Living arrangements for the past 2 months:  [facility] in [place], [place] prior to SNF)                                       Social Determinants of Health (SDOH) Interventions    Readmission Risk Interventions    05/14/2022   12:39 PM 04/13/2022    3:06 PM 04/13/2022   10:43 AM  Readmission Risk Prevention Plan  Transportation Screening Complete  Complete  PCP or Specialist Appt within 3-5 Days  Complete   HRI or Home Care  Consult  Complete   Social Work Consult for Recovery Care Planning/Counseling  Complete   Palliative Care Screening  Not Applicable   Medication Review [facility]) Complete Referral to Pharmacy Complete  PCP or Specialist appointment within 3-5 days of discharge Complete  Complete  HRI or Home Care Consult Complete  Complete  SW Recovery Care/Counseling Consult   Complete  Palliative Care Screening Not Applicable  Not Applicable  Skilled Nursing Facility Complete  Complete

## 2022-05-25 DIAGNOSIS — G9341 Metabolic encephalopathy: Secondary | ICD-10-CM | POA: Diagnosis not present

## 2022-05-25 DIAGNOSIS — E162 Hypoglycemia, unspecified: Secondary | ICD-10-CM | POA: Diagnosis not present

## 2022-05-25 DIAGNOSIS — R5381 Other malaise: Secondary | ICD-10-CM | POA: Diagnosis not present

## 2022-05-25 DIAGNOSIS — A419 Sepsis, unspecified organism: Secondary | ICD-10-CM | POA: Diagnosis not present

## 2022-05-26 DIAGNOSIS — L89154 Pressure ulcer of sacral region, stage 4: Secondary | ICD-10-CM | POA: Diagnosis not present

## 2022-05-26 DIAGNOSIS — L22 Diaper dermatitis: Secondary | ICD-10-CM | POA: Diagnosis not present

## 2022-05-27 ENCOUNTER — Emergency Department (HOSPITAL_COMMUNITY): Payer: Medicare HMO

## 2022-05-27 ENCOUNTER — Encounter (HOSPITAL_COMMUNITY): Payer: Self-pay

## 2022-05-27 ENCOUNTER — Inpatient Hospital Stay (HOSPITAL_COMMUNITY)
Admission: EM | Admit: 2022-05-27 | Discharge: 2022-06-04 | DRG: 673 | Disposition: A | Discharge status: Skilled Nursing Facility | Payer: Medicare HMO | Source: Skilled Nursing Facility | Attending: Family Medicine | Admitting: Family Medicine

## 2022-05-27 ENCOUNTER — Other Ambulatory Visit: Payer: Self-pay

## 2022-05-27 DIAGNOSIS — Z8249 Family history of ischemic heart disease and other diseases of the circulatory system: Secondary | ICD-10-CM

## 2022-05-27 DIAGNOSIS — M109 Gout, unspecified: Secondary | ICD-10-CM | POA: Diagnosis present

## 2022-05-27 DIAGNOSIS — F319 Bipolar disorder, unspecified: Secondary | ICD-10-CM | POA: Diagnosis present

## 2022-05-27 DIAGNOSIS — N179 Acute kidney failure, unspecified: Secondary | ICD-10-CM | POA: Diagnosis present

## 2022-05-27 DIAGNOSIS — E119 Type 2 diabetes mellitus without complications: Secondary | ICD-10-CM | POA: Diagnosis not present

## 2022-05-27 DIAGNOSIS — Z7189 Other specified counseling: Secondary | ICD-10-CM | POA: Diagnosis not present

## 2022-05-27 DIAGNOSIS — I700 Atherosclerosis of aorta: Secondary | ICD-10-CM | POA: Diagnosis not present

## 2022-05-27 DIAGNOSIS — Z820 Family history of epilepsy and other diseases of the nervous system: Secondary | ICD-10-CM

## 2022-05-27 DIAGNOSIS — M8788 Other osteonecrosis, other site: Secondary | ICD-10-CM | POA: Diagnosis not present

## 2022-05-27 DIAGNOSIS — R109 Unspecified abdominal pain: Secondary | ICD-10-CM | POA: Diagnosis not present

## 2022-05-27 DIAGNOSIS — R6520 Severe sepsis without septic shock: Secondary | ICD-10-CM | POA: Diagnosis present

## 2022-05-27 DIAGNOSIS — A419 Sepsis, unspecified organism: Secondary | ICD-10-CM | POA: Diagnosis not present

## 2022-05-27 DIAGNOSIS — G4733 Obstructive sleep apnea (adult) (pediatric): Secondary | ICD-10-CM | POA: Diagnosis not present

## 2022-05-27 DIAGNOSIS — R5381 Other malaise: Secondary | ICD-10-CM | POA: Diagnosis not present

## 2022-05-27 DIAGNOSIS — Z888 Allergy status to other drugs, medicaments and biological substances status: Secondary | ICD-10-CM

## 2022-05-27 DIAGNOSIS — Z818 Family history of other mental and behavioral disorders: Secondary | ICD-10-CM

## 2022-05-27 DIAGNOSIS — M4628 Osteomyelitis of vertebra, sacral and sacrococcygeal region: Secondary | ICD-10-CM | POA: Diagnosis present

## 2022-05-27 DIAGNOSIS — L89154 Pressure ulcer of sacral region, stage 4: Secondary | ICD-10-CM | POA: Diagnosis not present

## 2022-05-27 DIAGNOSIS — Z8673 Personal history of transient ischemic attack (TIA), and cerebral infarction without residual deficits: Secondary | ICD-10-CM

## 2022-05-27 DIAGNOSIS — N1831 Chronic kidney disease, stage 3a: Secondary | ICD-10-CM | POA: Diagnosis not present

## 2022-05-27 DIAGNOSIS — E1122 Type 2 diabetes mellitus with diabetic chronic kidney disease: Secondary | ICD-10-CM | POA: Diagnosis present

## 2022-05-27 DIAGNOSIS — Z931 Gastrostomy status: Secondary | ICD-10-CM

## 2022-05-27 DIAGNOSIS — R6521 Severe sepsis with septic shock: Secondary | ICD-10-CM | POA: Diagnosis not present

## 2022-05-27 DIAGNOSIS — Z794 Long term (current) use of insulin: Secondary | ICD-10-CM

## 2022-05-27 DIAGNOSIS — Y846 Urinary catheterization as the cause of abnormal reaction of the patient, or of later complication, without mention of misadventure at the time of the procedure: Secondary | ICD-10-CM | POA: Diagnosis present

## 2022-05-27 DIAGNOSIS — I5033 Acute on chronic diastolic (congestive) heart failure: Secondary | ICD-10-CM | POA: Diagnosis not present

## 2022-05-27 DIAGNOSIS — Z833 Family history of diabetes mellitus: Secondary | ICD-10-CM

## 2022-05-27 DIAGNOSIS — T83511S Infection and inflammatory reaction due to indwelling urethral catheter, sequela: Secondary | ICD-10-CM | POA: Diagnosis not present

## 2022-05-27 DIAGNOSIS — I5032 Chronic diastolic (congestive) heart failure: Secondary | ICD-10-CM | POA: Diagnosis not present

## 2022-05-27 DIAGNOSIS — B954 Other streptococcus as the cause of diseases classified elsewhere: Secondary | ICD-10-CM | POA: Diagnosis not present

## 2022-05-27 DIAGNOSIS — I2510 Atherosclerotic heart disease of native coronary artery without angina pectoris: Secondary | ICD-10-CM | POA: Diagnosis present

## 2022-05-27 DIAGNOSIS — R54 Age-related physical debility: Secondary | ICD-10-CM | POA: Diagnosis present

## 2022-05-27 DIAGNOSIS — I480 Paroxysmal atrial fibrillation: Secondary | ICD-10-CM | POA: Diagnosis not present

## 2022-05-27 DIAGNOSIS — L89159 Pressure ulcer of sacral region, unspecified stage: Secondary | ICD-10-CM | POA: Diagnosis not present

## 2022-05-27 DIAGNOSIS — I2782 Chronic pulmonary embolism: Secondary | ICD-10-CM | POA: Diagnosis not present

## 2022-05-27 DIAGNOSIS — E1151 Type 2 diabetes mellitus with diabetic peripheral angiopathy without gangrene: Secondary | ICD-10-CM | POA: Diagnosis present

## 2022-05-27 DIAGNOSIS — A409 Streptococcal sepsis, unspecified: Secondary | ICD-10-CM | POA: Diagnosis not present

## 2022-05-27 DIAGNOSIS — L89152 Pressure ulcer of sacral region, stage 2: Secondary | ICD-10-CM

## 2022-05-27 DIAGNOSIS — N390 Urinary tract infection, site not specified: Secondary | ICD-10-CM

## 2022-05-27 DIAGNOSIS — I96 Gangrene, not elsewhere classified: Secondary | ICD-10-CM | POA: Diagnosis present

## 2022-05-27 DIAGNOSIS — M879 Osteonecrosis, unspecified: Secondary | ICD-10-CM | POA: Diagnosis present

## 2022-05-27 DIAGNOSIS — R4182 Altered mental status, unspecified: Secondary | ICD-10-CM | POA: Diagnosis not present

## 2022-05-27 DIAGNOSIS — E1169 Type 2 diabetes mellitus with other specified complication: Secondary | ICD-10-CM | POA: Diagnosis not present

## 2022-05-27 DIAGNOSIS — B370 Candidal stomatitis: Secondary | ICD-10-CM | POA: Diagnosis not present

## 2022-05-27 DIAGNOSIS — I10 Essential (primary) hypertension: Secondary | ICD-10-CM | POA: Diagnosis present

## 2022-05-27 DIAGNOSIS — M869 Osteomyelitis, unspecified: Secondary | ICD-10-CM | POA: Diagnosis not present

## 2022-05-27 DIAGNOSIS — M19012 Primary osteoarthritis, left shoulder: Secondary | ICD-10-CM | POA: Diagnosis not present

## 2022-05-27 DIAGNOSIS — I2699 Other pulmonary embolism without acute cor pulmonale: Secondary | ICD-10-CM | POA: Diagnosis present

## 2022-05-27 DIAGNOSIS — B964 Proteus (mirabilis) (morganii) as the cause of diseases classified elsewhere: Secondary | ICD-10-CM | POA: Diagnosis present

## 2022-05-27 DIAGNOSIS — E1140 Type 2 diabetes mellitus with diabetic neuropathy, unspecified: Secondary | ICD-10-CM | POA: Diagnosis not present

## 2022-05-27 DIAGNOSIS — Z8261 Family history of arthritis: Secondary | ICD-10-CM

## 2022-05-27 DIAGNOSIS — I110 Hypertensive heart disease with heart failure: Secondary | ICD-10-CM | POA: Diagnosis present

## 2022-05-27 DIAGNOSIS — R509 Fever, unspecified: Secondary | ICD-10-CM

## 2022-05-27 DIAGNOSIS — R1084 Generalized abdominal pain: Secondary | ICD-10-CM | POA: Diagnosis not present

## 2022-05-27 DIAGNOSIS — D649 Anemia, unspecified: Secondary | ICD-10-CM | POA: Diagnosis not present

## 2022-05-27 DIAGNOSIS — R5383 Other fatigue: Secondary | ICD-10-CM

## 2022-05-27 DIAGNOSIS — Z515 Encounter for palliative care: Secondary | ICD-10-CM | POA: Diagnosis not present

## 2022-05-27 DIAGNOSIS — Z86711 Personal history of pulmonary embolism: Secondary | ICD-10-CM

## 2022-05-27 DIAGNOSIS — L89616 Pressure-induced deep tissue damage of right heel: Secondary | ICD-10-CM | POA: Diagnosis not present

## 2022-05-27 DIAGNOSIS — R000 Tachycardia, unspecified: Secondary | ICD-10-CM | POA: Diagnosis not present

## 2022-05-27 DIAGNOSIS — T83511A Infection and inflammatory reaction due to indwelling urethral catheter, initial encounter: Secondary | ICD-10-CM | POA: Diagnosis not present

## 2022-05-27 DIAGNOSIS — E118 Type 2 diabetes mellitus with unspecified complications: Secondary | ICD-10-CM | POA: Diagnosis not present

## 2022-05-27 DIAGNOSIS — Z9989 Dependence on other enabling machines and devices: Secondary | ICD-10-CM

## 2022-05-27 DIAGNOSIS — Z79899 Other long term (current) drug therapy: Secondary | ICD-10-CM

## 2022-05-27 DIAGNOSIS — G9340 Encephalopathy, unspecified: Secondary | ICD-10-CM | POA: Diagnosis present

## 2022-05-27 DIAGNOSIS — G9341 Metabolic encephalopathy: Secondary | ICD-10-CM | POA: Diagnosis present

## 2022-05-27 DIAGNOSIS — I491 Atrial premature depolarization: Secondary | ICD-10-CM | POA: Diagnosis not present

## 2022-05-27 DIAGNOSIS — R279 Unspecified lack of coordination: Secondary | ICD-10-CM | POA: Diagnosis not present

## 2022-05-27 DIAGNOSIS — M8618 Other acute osteomyelitis, other site: Secondary | ICD-10-CM | POA: Diagnosis not present

## 2022-05-27 DIAGNOSIS — R1030 Lower abdominal pain, unspecified: Secondary | ICD-10-CM | POA: Diagnosis not present

## 2022-05-27 DIAGNOSIS — R7881 Bacteremia: Secondary | ICD-10-CM | POA: Diagnosis not present

## 2022-05-27 DIAGNOSIS — Z7401 Bed confinement status: Secondary | ICD-10-CM | POA: Diagnosis not present

## 2022-05-27 DIAGNOSIS — R404 Transient alteration of awareness: Secondary | ICD-10-CM | POA: Diagnosis not present

## 2022-05-27 DIAGNOSIS — Z7901 Long term (current) use of anticoagulants: Secondary | ICD-10-CM

## 2022-05-27 DIAGNOSIS — Z09 Encounter for follow-up examination after completed treatment for conditions other than malignant neoplasm: Secondary | ICD-10-CM | POA: Diagnosis not present

## 2022-05-27 LAB — URINALYSIS, ROUTINE W REFLEX MICROSCOPIC
Bilirubin Urine: NEGATIVE
Glucose, UA: NEGATIVE mg/dL
Hgb urine dipstick: NEGATIVE
Ketones, ur: NEGATIVE mg/dL
Nitrite: NEGATIVE
Protein, ur: 30 mg/dL — AB
Specific Gravity, Urine: 1.016 (ref 1.005–1.030)
WBC, UA: >50 WBC/hpf — ABNORMAL HIGH (ref 0–5)
pH: 8 (ref 5.0–8.0)

## 2022-05-27 LAB — CBC WITH DIFFERENTIAL/PLATELET
Abs Immature Granulocytes: 0.02 10*3/uL (ref 0.00–0.07)
Basophils Absolute: 0 10*3/uL (ref 0.0–0.1)
Basophils Relative: 0 %
Eosinophils Absolute: 0 10*3/uL (ref 0.0–0.5)
Eosinophils Relative: 0 %
HCT: 44 % (ref 39.0–52.0)
Hemoglobin: 13.5 g/dL (ref 13.0–17.0)
Immature Granulocytes: 0 %
Lymphocytes Relative: 10 %
Lymphs Abs: 0.8 10*3/uL (ref 0.7–4.0)
MCH: 27.1 pg (ref 26.0–34.0)
MCHC: 30.7 g/dL (ref 30.0–36.0)
MCV: 88.2 fL (ref 80.0–100.0)
Monocytes Absolute: 0.4 10*3/uL (ref 0.1–1.0)
Monocytes Relative: 4 %
Neutro Abs: 7.6 10*3/uL (ref 1.7–7.7)
Neutrophils Relative %: 86 %
Platelets: 399 10*3/uL (ref 150–400)
RBC: 4.99 MIL/uL (ref 4.22–5.81)
RDW: 14.9 % (ref 11.5–15.5)
WBC: 8.9 10*3/uL (ref 4.0–10.5)
nRBC: 0 % (ref 0.0–0.2)

## 2022-05-27 LAB — COMPREHENSIVE METABOLIC PANEL
ALT: 72 U/L — ABNORMAL HIGH (ref 0–44)
AST: 50 U/L — ABNORMAL HIGH (ref 15–41)
Albumin: 2.6 g/dL — ABNORMAL LOW (ref 3.5–5.0)
Alkaline Phosphatase: 80 U/L (ref 38–126)
Anion gap: 8 (ref 5–15)
BUN: 69 mg/dL — ABNORMAL HIGH (ref 8–23)
CO2: 25 mmol/L (ref 22–32)
Calcium: 9.5 mg/dL (ref 8.9–10.3)
Chloride: 99 mmol/L (ref 98–111)
Creatinine, Ser: 1.38 mg/dL — ABNORMAL HIGH (ref 0.61–1.24)
GFR, Estimated: 56 mL/min — ABNORMAL LOW (ref >60)
Glucose, Bld: 272 mg/dL — ABNORMAL HIGH (ref 70–99)
Potassium: 5.4 mmol/L — ABNORMAL HIGH (ref 3.5–5.1)
Sodium: 132 mmol/L — ABNORMAL LOW (ref 135–145)
Total Bilirubin: 0.5 mg/dL (ref 0.3–1.2)
Total Protein: 8.2 g/dL — ABNORMAL HIGH (ref 6.5–8.1)

## 2022-05-27 LAB — C-REACTIVE PROTEIN: CRP: 19.3 mg/dL — ABNORMAL HIGH (ref <1.0)

## 2022-05-27 LAB — PROTIME-INR
INR: 1.4 — ABNORMAL HIGH (ref 0.8–1.2)
Prothrombin Time: 16.8 seconds — ABNORMAL HIGH (ref 11.4–15.2)

## 2022-05-27 LAB — LACTIC ACID, PLASMA
Lactic Acid, Venous: 1.5 mmol/L (ref 0.5–1.9)
Lactic Acid, Venous: 1.1 mmol/L (ref 0.5–1.9)

## 2022-05-27 LAB — SEDIMENTATION RATE: Sed Rate: 82 mm/hr — ABNORMAL HIGH (ref 0–16)

## 2022-05-27 MED ORDER — ONDANSETRON HCL 4 MG PO TABS: 4.0000 mg | ORAL_TABLET | Freq: Four times a day (QID) | ORAL | Status: DC | PRN

## 2022-05-27 MED ORDER — INSULIN ASPART 100 UNIT/ML IJ SOLN
0.0000 [IU] | INTRAMUSCULAR | Status: DC
  Administered 2022-05-28 (×3): 2 [IU] via SUBCUTANEOUS
  Administered 2022-05-28 (×2): 1 [IU] via SUBCUTANEOUS
  Administered 2022-05-29 (×2): 5 [IU] via SUBCUTANEOUS
  Administered 2022-05-29: 2 [IU] via SUBCUTANEOUS
  Administered 2022-05-29 (×2): 3 [IU] via SUBCUTANEOUS
  Administered 2022-05-29: 5 [IU] via SUBCUTANEOUS
  Administered 2022-05-30: 2 [IU] via SUBCUTANEOUS
  Administered 2022-05-30 (×3): 3 [IU] via SUBCUTANEOUS
  Administered 2022-05-30: 5 [IU] via SUBCUTANEOUS
  Administered 2022-05-30: 3 [IU] via SUBCUTANEOUS
  Administered 2022-05-31: 5 [IU] via SUBCUTANEOUS
  Administered 2022-05-31: 1 [IU] via SUBCUTANEOUS
  Administered 2022-05-31 (×3): 2 [IU] via SUBCUTANEOUS
  Administered 2022-05-31 – 2022-06-01 (×5): 5 [IU] via SUBCUTANEOUS

## 2022-05-27 MED ORDER — SODIUM CHLORIDE 0.9 % IV SOLN: INTRAVENOUS | Status: AC

## 2022-05-27 MED ORDER — METRONIDAZOLE 500 MG/100ML IV SOLN
500.0000 mg | Freq: Two times a day (BID) | INTRAVENOUS | Status: DC
  Administered 2022-05-28 – 2022-05-31 (×8): 500 mg via INTRAVENOUS
  Filled 2022-05-27 (×8): qty 100

## 2022-05-27 MED ORDER — VANCOMYCIN HCL 2000 MG/400ML IV SOLN
2000.0000 mg | Freq: Once | INTRAVENOUS | Status: AC
  Administered 2022-05-27: 2000 mg via INTRAVENOUS
  Filled 2022-05-27: qty 400

## 2022-05-27 MED ORDER — CHLORHEXIDINE GLUCONATE CLOTH 2 % EX PADS
6.0000 | MEDICATED_PAD | Freq: Every day | CUTANEOUS | Status: DC
  Administered 2022-05-27 – 2022-06-04 (×8): 6 via TOPICAL

## 2022-05-27 MED ORDER — SODIUM CHLORIDE 0.9 % IV BOLUS
1000.0000 mL | Freq: Once | INTRAVENOUS | Status: AC
  Administered 2022-05-27: 1000 mL via INTRAVENOUS

## 2022-05-27 MED ORDER — ACETAMINOPHEN 650 MG RE SUPP: 650.0000 mg | Freq: Four times a day (QID) | RECTAL | Status: DC | PRN

## 2022-05-27 MED ORDER — ACETAMINOPHEN 325 MG PO TABS
650.0000 mg | ORAL_TABLET | Freq: Four times a day (QID) | ORAL | Status: DC | PRN
  Administered 2022-05-29: 650 mg via ORAL
  Filled 2022-05-27: qty 2

## 2022-05-27 MED ORDER — IOHEXOL 350 MG/ML SOLN
75.0000 mL | Freq: Once | INTRAVENOUS | Status: AC | PRN
  Administered 2022-05-27: 80 mL via INTRAVENOUS

## 2022-05-27 MED ORDER — CEFEPIME HCL 2 G IV SOLR
2.0000 g | Freq: Three times a day (TID) | INTRAVENOUS | Status: DC
  Administered 2022-05-27 – 2022-05-31 (×11): 2 g via INTRAVENOUS
  Filled 2022-05-27 (×12): qty 12.5

## 2022-05-27 MED ORDER — ACETAMINOPHEN 650 MG RE SUPP
650.0000 mg | Freq: Once | RECTAL | Status: AC
  Administered 2022-05-27: 650 mg via RECTAL
  Filled 2022-05-27: qty 1

## 2022-05-27 MED ORDER — ENOXAPARIN SODIUM 60 MG/0.6ML IJ SOSY
60.0000 mg | PREFILLED_SYRINGE | Freq: Every day | INTRAMUSCULAR | Status: DC
Start: 2022-05-27 — End: 2022-05-30
  Administered 2022-05-28 – 2022-05-29 (×3): 60 mg via SUBCUTANEOUS
  Filled 2022-05-27 (×2): qty 0.6

## 2022-05-27 MED ORDER — SODIUM CHLORIDE 0.9 % IV SOLN
1.0000 g | Freq: Once | INTRAVENOUS | Status: AC
  Administered 2022-05-27: 1 g via INTRAVENOUS
  Filled 2022-05-27: qty 10

## 2022-05-27 MED ORDER — ACETAMINOPHEN 10 MG/ML IV SOLN
1000.0000 mg | Freq: Four times a day (QID) | INTRAVENOUS | Status: DC
  Administered 2022-05-27: 1000 mg via INTRAVENOUS
  Filled 2022-05-27 (×4): qty 100

## 2022-05-27 MED ORDER — VANCOMYCIN HCL 1750 MG/350ML IV SOLN
1750.0000 mg | INTRAVENOUS | Status: DC
  Administered 2022-05-28 – 2022-05-30 (×3): 1750 mg via INTRAVENOUS
  Filled 2022-05-27 (×4): qty 350

## 2022-05-27 MED ORDER — ONDANSETRON HCL 4 MG/2ML IJ SOLN: 4.0000 mg | Freq: Four times a day (QID) | INTRAMUSCULAR | Status: DC | PRN

## 2022-05-27 MED ORDER — LACTATED RINGERS IV BOLUS
1000.0000 mL | Freq: Once | INTRAVENOUS | Status: AC
Start: 2022-05-27 — End: 2022-05-27
  Administered 2022-05-27: 1000 mL via INTRAVENOUS

## 2022-05-27 NOTE — ED Notes (Signed)
Oral temp 103.1, physician made aware, IV tylenol ordered, ice packs placed under B/L axillary and groin area. Patient repositioned for comfort.

## 2022-05-27 NOTE — ED Triage Notes (Signed)
[facility] called out to [facility] for fever.  CBG 259. HR 130's. Indwelling foley noted.

## 2022-05-27 NOTE — Assessment & Plan Note (Addendum)
Large stage IV sacral decubitus ulcer.  Patient nonambulatory.  Poor functional status and debility after 2 prolonged recent hospitalizations in April and then May to June.  CT suggesting osteomyelitis of the coccyx.  No abscess. -Considering baseline poor functional status, deferred admission to [facility] for now -Will consult general surgery if debridement is indicated or needed, and if culture sample can be obtained -Broad-spectrum antibiotics -Palliative care consult - May need Tele- ID guidance with antibiotics

## 2022-05-27 NOTE — Assessment & Plan Note (Signed)
Stable. -Hold metoprolol 50 mg daily while n.p.o. -As needed labetalol for systolic greater than 170

## 2022-05-27 NOTE — ED Notes (Signed)
Verbal report given to assigned nurse in [facility]U. Transferring patient via bed.

## 2022-05-27 NOTE — ED Notes (Signed)
Patient has feeding tube, Urinary catheter, and unstageable pressure ulcer on sacrum per EMS coming from [facility] of [place].

## 2022-05-27 NOTE — ED Provider Notes (Signed)
[facility] Provider Note   CSN: [id number] Arrival date & time: 05/27/22  1116     History Chief Complaint  Patient presents with   Fever    Christian [name] is a 71 y.o. male with history of type 2 diabetes, congestive heart failure, and chronic kidney disease who presents to the emergency department today for further evaluation of fever.  Per triage note EMS was called out to [facility] rehab for fever.  Blood sugar was 259 per their report.  Heart rate was also in the 130s.  Patient has an indwelling catheter.  Rest of the history is limited as the patient does not respond.  I attempted to call [facility] rehab twice in the first time they hung up on me and the second time went to voicemail.   Fever      Home Medications Prior to Admission medications   Medication Sig Start Date End Date Taking? Authorizing Provider  acetaminophen (TYLENOL) 500 MG tablet Take 1,000 mg by mouth daily as needed (pain.).   Yes [provider]  ARIPiprazole (ABILIFY) 10 MG tablet Take 1 tablet (10 mg total) by mouth every morning. 04/29/22  Yes Antonieta Pert, MD  atorvastatin (LIPITOR) 10 MG tablet Take 1 tablet (10 mg total) by mouth daily at 6 PM. Patient taking differently: Take 10 mg by mouth every evening. 02/05/20  Yes Starkes-Perry, Gayland Curry, FNP  benztropine (COGENTIN) 1 MG tablet Take 1 tablet (1 mg total) by mouth 2 (two) times daily. 04/29/22  Yes Antonieta Pert, MD  bisacodyl (DULCOLAX) 5 MG EC tablet Take 2 tablets (10 mg total) by mouth daily as needed for moderate constipation. 04/29/22  Yes Antonieta Pert, MD  HYDROcodone-acetaminophen (NORCO/VICODIN) 5-325 MG tablet Take 1 tablet by mouth every 6 (six) hours as needed for up to 4 doses for moderate pain. 04/29/22  Yes Kc, Maren Beach, MD  insulin aspart (NOVOLOG) 100 UNIT/ML FlexPen Before each meal 3 times a day, 140-199 - 2 units, 200-250 - 4 units, 251-299 - 6 units,  300-349 - 8 units,  350 or above 10 units. Insulin PEN if  approved, provide syringes and needles if needed. 05/20/22  Yes Thurnell Lose, MD  insulin glargine-yfgn (SEMGLEE) 100 UNIT/ML injection Inject 0.15 mLs (15 Units total) into the skin 2 (two) times daily. 05/20/22  Yes Thurnell Lose, MD  liothyronine (CYTOMEL) 5 MCG tablet Take 10 mcg by mouth daily.   Yes [provider]  melatonin 10 MG TABS Take 10 mg by mouth at bedtime as needed (sleep). 04/29/22  Yes Antonieta Pert, MD  metoprolol succinate (TOPROL-XL) 50 MG 24 hr tablet Take 1 tablet (50 mg total) by mouth daily. Take with or immediately following a meal. Patient taking differently: Take 50 mg by mouth daily. 01/28/22 03/13/23 Yes Charlynne Cousins, MD  Nutritional Supplements (FEEDING SUPPLEMENT, GLUCERNA 1.5 CAL,) LIQD Place 1,000 mLs into feeding tube daily. 05/20/22  Yes Thurnell Lose, MD  Nutritional Supplements (FEEDING SUPPLEMENT, PROSOURCE TF,) liquid Place 45 mLs into feeding tube 3 (three) times daily. 05/20/22  Yes Thurnell Lose, MD  polyethylene glycol powder (GLYCOLAX/MIRALAX) 17 GM/SCOOP powder Take 17 g by mouth daily. Patient taking differently: Take 17 g by mouth daily as needed for mild constipation. 10/14/20  Yes Ghimire, [name], MD  rivaroxaban (XARELTO) 20 MG TABS tablet Take 1 tablet (20 mg total) by mouth daily with supper. Patient taking differently: Take 20 mg by mouth daily. 04/02/20  Yes Dhungel,  Nishant, MD  senna-docusate (SENOKOT-S) 8.6-50 MG tablet Take 2 tablets by mouth 2 (two) times daily. 04/29/22  Yes Antonieta Pert, MD  tamsulosin (FLOMAX) 0.4 MG CAPS capsule Take 1 capsule (0.4 mg total) by mouth daily. 05/21/22  Yes Thurnell Lose, MD  Vitamin D, Ergocalciferol, (DRISDOL) 1.25 MG (50000 UNIT) CAPS capsule Take 50,000 Units by mouth every 7 (seven) days.   Yes [provider]  Water For Irrigation, Sterile (FREE WATER) SOLN Place 150 mLs into feeding tube every 6 (six) hours. 05/20/22  Yes Thurnell Lose, MD      Allergies     Amlodipine, Onglyza [saxagliptin], Zyprexa [olanzapine], and Ritalin [methylphenidate]    Review of Systems   Review of Systems  Constitutional:  Positive for fever.  All other systems reviewed and are negative.   Physical Exam Updated Vital Signs BP (!) 138/96   Pulse (!) 128   Temp 98.7 F (37.1 C) (Oral)   Resp (!) 22   Ht 6' (1.829 m)   Wt 136 kg   SpO2 100%   BMI 40.66 kg/m  Physical Exam Vitals and nursing note reviewed.  Constitutional:      General: He is not in acute distress.    Appearance: Normal appearance.     Comments: Chronically ill-appearing.  HENT:     Head: Normocephalic and atraumatic.  Eyes:     General:        Right eye: No discharge.        Left eye: No discharge.  Cardiovascular:     Comments: Regular rate and rhythm.  S1/S2 are distinct without any evidence of murmur, rubs, or gallops.  Radial pulses are 2+ bilaterally.  Dorsalis pedis pulses are 2+ bilaterally.  No evidence of pedal edema. Pulmonary:     Comments: Clear to auscultation bilaterally.  Normal effort.  No respiratory distress.  No evidence of wheezes, rales, or rhonchi heard throughout. Abdominal:     General: Abdomen is flat. Bowel sounds are normal. There is no distension.     Tenderness: There is no abdominal tenderness. There is no guarding or rebound.    Genitourinary:    Comments: Urine draining in bag.  Urine does not appear cloudy. Musculoskeletal:        General: Normal range of motion.     Cervical back: Neck supple.  Skin:    General: Skin is warm and dry.     Findings: No rash.     Comments: There is a large stage IV decubitus ulcer over the sacrum without any evidence of surrounding erythema or purulence.  Neurological:     General: No focal deficit present.     Mental Status: He is alert.  Psychiatric:        Mood and Affect: Mood normal.        Behavior: Behavior normal.      ED Results / Procedures / Treatments   Labs (all labs ordered are listed,  but only abnormal results are displayed) Labs Reviewed  COMPREHENSIVE METABOLIC PANEL - Abnormal; Notable for the following components:      Result Value   Sodium 132 (*)    Potassium 5.4 (*)    Glucose, Bld 272 (*)    BUN 69 (*)    Creatinine, Ser 1.38 (*)    Total Protein 8.2 (*)    Albumin 2.6 (*)    AST 50 (*)    ALT 72 (*)    GFR, Estimated 56 (*)  All other components within normal limits  PROTIME-INR - Abnormal; Notable for the following components:   Prothrombin Time 16.8 (*)    INR 1.4 (*)    All other components within normal limits  URINALYSIS, ROUTINE W REFLEX MICROSCOPIC - Abnormal; Notable for the following components:   APPearance CLOUDY (*)    Protein, ur 30 (*)    Leukocytes,Ua LARGE (*)    WBC, UA >50 (*)    Bacteria, UA FEW (*)    All other components within normal limits  CULTURE, BLOOD (ROUTINE X 2)  CULTURE, BLOOD (ROUTINE X 2)  LACTIC ACID, PLASMA  LACTIC ACID, PLASMA  CBC WITH DIFFERENTIAL/PLATELET  SEDIMENTATION RATE  C-REACTIVE PROTEIN    EKG None  Radiology CT Angio Chest PE W and/or Wo Contrast  Result Date: 05/27/2022 CLINICAL DATA:  Persistent tachycardia. EXAM: CT ANGIOGRAPHY CHEST WITH CONTRAST TECHNIQUE: Multidetector CT imaging of the chest was performed using the standard protocol during bolus administration of intravenous contrast. Multiplanar CT image reconstructions and MIPs were obtained to evaluate the vascular anatomy. RADIATION DOSE REDUCTION: This exam was performed according to the departmental dose-optimization program which includes automated exposure control, adjustment of the mA and/or kV according to patient size and/or use of iterative reconstruction technique. CONTRAST:  19mL OMNIPAQUE IOHEXOL 350 MG/ML SOLN COMPARISON:  Apr 19, 2022 FINDINGS: Cardiovascular: Enlarged heart. Heavy calcific atherosclerotic disease of the coronary arteries. Mediastinum/Nodes: No enlarged mediastinal, hilar, or axillary lymph nodes. Thyroid  gland, trachea, and esophagus demonstrate no significant findings. Lungs/Pleura: Scarring versus atelectasis in bilateral lung bases. Biapical pleural thickening, left greater than right. Areas of ground-glass opacities in the left lung apex with associated mild architectural distortion. Upper Abdomen: No acute abnormality. Musculoskeletal: No chest wall abnormality. No acute or significant osseous findings. Review of the MIP images confirms the above findings. IMPRESSION: 1. No evidence of pulmonary embolism. 2. Enlarged heart with heavy calcific atherosclerotic disease of the coronary arteries. 3. Scarring versus atelectasis in bilateral lung bases. 4. Areas of ground-glass opacities in the left lung apex with associated mild architectural distortion. This finding has stable since 2019, which supports scarring. 5. Biapical pleural thickening, left greater than right. Electronically Signed   By: Ted Mcalpine M.D.   On: 05/27/2022 17:28   CT ABDOMEN PELVIS W CONTRAST  Result Date: 05/27/2022 CLINICAL DATA:  Abdominal pain.  Sacral ulcer.  Fever. EXAM: CT ABDOMEN AND PELVIS WITH CONTRAST TECHNIQUE: Multidetector CT imaging of the abdomen and pelvis was performed using the standard protocol following bolus administration of intravenous contrast. RADIATION DOSE REDUCTION: This exam was performed according to the departmental dose-optimization program which includes automated exposure control, adjustment of the mA and/or kV according to patient size and/or use of iterative reconstruction technique. CONTRAST:  62mL OMNIPAQUE IOHEXOL 350 MG/ML SOLN COMPARISON:  CT abdomen and pelvis 04/13/2022 FINDINGS: Lower chest: There is some atelectasis in the lung bases. Hepatobiliary: No focal liver abnormality is seen. No gallstones, gallbladder wall thickening, or biliary dilatation. Pancreas: Unremarkable. No pancreatic ductal dilatation or surrounding inflammatory changes. Spleen: Normal in size without focal  abnormality. Adrenals/Urinary Tract: Again seen is an extrarenal pelvis on the left. There is no hydronephrosis or perinephric fluid. The adrenal glands are within normal limits. The bladder is decompressed by Foley catheter. Stomach/Bowel: Rectal wall thickening with surrounding inflammation and presacral edema appears unchanged. There is no evidence for bowel obstruction or free air. There is a large amount of stool throughout the colon. Appendix is within normal limits. Small bowel  is within normal limits. Percutaneous gastrostomy tube in place, new from prior. Stomach is nondilated. Vascular/Lymphatic: There are severe atherosclerotic calcifications of the aorta. Peripheral vascular calcifications are also seen. No evidence for aortic aneurysm. No enlarged lymph nodes are identified. Reproductive: Prostate is unremarkable. Other: No ascites or focal abdominal wall hernia. Musculoskeletal: Sacral decubitus ulcer is again seen overlying the distal sacrum and coccyx. Previously identified fluid collection is no longer seen. The ulcer abuts the tip of the coccyx and distal sacrum with some questionable mild underlying erosive change of the coccyx. IMPRESSION: 1. Sacral decubitus ulcer again seen extending to the level of the distal sacrum and coccyx. There are erosive changes of the coccyx compatible with osteomyelitis. 2. Rectal wall thickening with surrounding inflammation persists concerning for proctitis. 3. Foley catheter in place. 4. New gastrostomy tube present. 5. Severe atherosclerotic disease. Aortic Atherosclerosis (ICD10-I70.0). Electronically Signed   By: Darliss Cheney M.D.   On: 05/27/2022 17:25   DG Chest Port 1 View  Result Date: 05/27/2022 CLINICAL DATA:  Provided history: Fever, sepsis protocol. EXAM: PORTABLE CHEST 1 VIEW COMPARISON:  Prior chest radiographs 05/19/2022 and earlier. FINDINGS: Shallow inspiration radiograph. Heart size within normal limits. Aortic atherosclerosis. No appreciable  airspace consolidation or pulmonary edema. No evidence of pleural effusion or pneumothorax. No acute bony abnormality identified. Degenerative changes of the spine and left glenohumeral joint. IMPRESSION: Shallow inspiration radiograph. No evidence of acute cardiopulmonary abnormality. Aortic Atherosclerosis (ICD10-I70.0). Electronically Signed   By: Jackey Loge D.O.   On: 05/27/2022 12:17    Procedures .Critical Care  Performed by: Teressa Lower, PA-C Authorized by: Teressa Lower, PA-C   Critical care provider statement:    Critical care time (minutes):  35   Critical care time was exclusive of:  Separately billable procedures and treating other patients   Critical care was necessary to treat or prevent imminent or life-threatening deterioration of the following conditions:  Renal failure   Critical care was time spent personally by me on the following activities:  Blood draw for specimens, development of treatment plan with patient or surrogate, discussions with consultants, discussions with primary provider, ordering and review of radiographic studies, ordering and review of laboratory studies and re-evaluation of patient's condition     Medications Ordered in ED Medications  vancomycin (VANCOREADY) IVPB 2000 mg/400 mL (has no administration in time range)  ceFEPIme (MAXIPIME) 2 g in sodium chloride 0.9 % 100 mL IVPB (has no administration in time range)  vancomycin (VANCOREADY) IVPB 1750 mg/350 mL (has no administration in time range)  sodium chloride 0.9 % bolus 1,000 mL (0 mLs Intravenous Stopped 05/27/22 1645)  sodium chloride 0.9 % bolus 1,000 mL (1,000 mLs Intravenous New Bag/Given 05/27/22 1645)  cefTRIAXone (ROCEPHIN) 1 g in sodium chloride 0.9 % 100 mL IVPB (0 g Intravenous Stopped 05/27/22 1812)  iohexol (OMNIPAQUE) 350 MG/ML injection 75 mL (80 mLs Intravenous Contrast Given 05/27/22 1706)    ED Course/ Medical Decision Making/ A&P Clinical Course as of 05/27/22 1812   Thu May 27, 2022  1638 Family is at bedside and provides additional history.  They last spoke with him a couple of days ago and he was normal per the family.  Family states that at baseline he is generally pretty quiet but somewhat conversant and interactive.  Patient recently had a G-tube placed last week. [CF]  1742 CBC with Differential No evidence of leukocytosis or anemia. [CF]  1742 Comprehensive metabolic panel(!) There is evidence of hyponatremia and  hyperkalemia.  Glucose is elevated.  Patient also does have evidence of an AKI here.  Patient has had 1 L of fluids.  There is also evidence of transaminitis which was present on recent blood work. [CF]  [id number] Urinalysis, Routine w reflex microscopic Urine, Catheterized(!) Urinalysis does reveal signs of infection in the setting of urinary catheter. [CF]  1743 Culture, blood (Routine x 2) Blood cultures were obtained and sent. [CF]  1743 Lactic acid, plasma Lactic acid normal.  [CF]  1812 I spoke with Dr. Denton Brick with [facility] who agrees to admit the patient.  [CF]    Clinical Course User Index [CF] Hendricks Limes, PA-C                           Medical Decision Making Christian [name] is a 71 y.o. male patient presents to the emerged from today for altered mental status, increased lethargy.  Patient not able to provide history for me.  I contacted the rehab facility where the patient is staying twice and was unable to talk with anyone.  Likely, family at bedside provides ample history.  Patient has multiple possible sources of infection including the G-tube, urinary catheter, and decubitus ulcer.  We will get broad labs to rule out sepsis.  The patient is tachycardic.  We will plan to reassess as labs come back.   Amount and/or Complexity of Data Reviewed Labs: ordered. Decision-making details documented in ED Course. Radiology: ordered.  Risk Prescription drug management. Parenteral controlled substances. Decision  regarding hospitalization. Risk Details: Given the clinical scenario, I do feel the patient should likely come in for osteomyelitis, AKI, and UTI in the setting of urinary catheter.  Patient has a lot of comorbidities.  Broad-spectrum antibiotics were started including vancomycin and cefepime.  I will work on getting him admitted to the hospitalist service.   Final Clinical Impression(s) / ED Diagnoses Final diagnoses:  Osteomyelitis, unspecified site, unspecified type ([place])  Urinary tract infection associated with indwelling urethral catheter, initial encounter [facility])  Lethargy    Rx / DC Orders ED Discharge Orders     None         Hendricks Limes, [place] 05/27/22 1814    Milton Ferguson, MD 05/28/22 [phone]

## 2022-05-27 NOTE — ED Notes (Signed)
Patient has unstageable sacral ulcer with bowel movement. Patient changed and provided with new wet to dry dressing and covered with mepaplex.

## 2022-05-27 NOTE — ED Notes (Signed)
Patient unable to follow directions to suck water through  straw and swallow.

## 2022-05-27 NOTE — H&P (Signed)
History and Physical    Christian [name] [id number] DOB: Feb 10, 1951 DOA: 05/27/2022  PCP: Donald Prose, MD   Patient coming from: [facility].  I have personally briefly reviewed patient's old medical records in [place]  Chief Complaint: Altered mental status, lethargy  HPI: Christian [name] is a 71 y.o. male with medical history significant for diabetes mellitus, CVA, bipolar disorder, coronary artery disease, PE and DVT, CKD 3, atrial fibrillation, OSA, gastrostomy tube and chronic Foley catheter. Patient reports to the ED from nursing home reports of altered mental status, increasing lethargy and fever, with tachycardia heart rate in the 130s. At the time of my evaluation, patient is awake, is not and has not been responding to questions or following directions in the ED. ED provider attempted to reach nursing home, was unsuccessful.  I am unable to reach nursing home.  Family was at bedside and reported that at baseline he is generally quiet, but somewhat conversant and interactive.  G-tube was placed last week.   Recent prolonged hospitalization 5/2 to 6/22-admitted initially for weakness, hypoglycemia, hyperkalemia, rigidity thought  2/2 EPS, sepsis secondary to strep bacteremia from sacral decubitus ulcer with abscess.  Completed course of antimicrobial therapy and hydrotherapy.  Also failure to thrive, PEG tube was inserted 6/19.  Acute urinary retention for which Foley was placed, follow-up with urology in 1 to 2 weeks.  Chronic debility, patient has been nonambulatory since hospitalization in April 2023.  Palliative care was consulted.Marland Kitchen  Hospitalization 4/13 to 4/21 for hypoglycemia, AKI on CKD, encephalopathy thought secondary to hypoglycemia and infection, also lower extremity cellulitis, and severe debility.  Elevated troponin thought secondary to demand ischemia in setting of CKD.  ED Course: Febrile to 103.1.   Tachycardic heart rate ranging from  60-140.  Tachypneic- respiratory rate 20-36.  Blood pressure systolic [id number].  O2 sats 97 - 100 on room air. UA suggestive of UTI with few bacteria and large leukocytes. X-ray without acute abnormality, subsequent CTA chest done for persistent tachycardia-negative for PE, showed heavily calcified peak atherosclerotic disease of the coronary arteries, atelectasis in bilateral lung bases scarring of left lung apex. CTA abdomen and pelvis shows sacral decubitus ulcer extending to the level of the distal sacrum and coccyx, with erosive changes of the coccyx compatible with osteomyelitis.  Rectal wall thickening with surrounding inflammation persist concerning for proctitis.  Review of Systems: Unable to assess due to altered mental status  Past Medical History:  Diagnosis Date   Bilateral lower extremity edema: chronic with venous stasis changes 06/02/2014   Bipolar 1 disorder (HCC)    Bipolar disorder (HCC)    CHF (congestive heart failure) ([place])    Chronic kidney disease    Gout    Gout    Hx of blood clots    Hypertension    Mood swings    OSA on CPAP 06/02/2014   Other and unspecified hyperlipidemia 06/02/2014   Type II or unspecified type diabetes mellitus with unspecified complication, uncontrolled 06/02/2014    Past Surgical History:  Procedure Laterality Date   ABDOMINAL AORTOGRAM W/LOWER EXTREMITY N/A 09/16/2021   Procedure: ABDOMINAL AORTOGRAM W/LOWER EXTREMITY;  Surgeon: Broadus John, MD;  Location: [place] [facility];  Service: Cardiovascular;  Laterality: N/A;   IR GASTROSTOMY TUBE MOD SED  05/17/2022   LEFT HEART CATH AND CORONARY ANGIOGRAPHY Left 04/01/2020   Procedure: LEFT HEART CATH AND CORONARY ANGIOGRAPHY;  Surgeon: Dionisio David, MD;  Location: [place] [facility];  Service: Cardiovascular;  Laterality: Left;   PERIPHERAL VASCULAR BALLOON ANGIOPLASTY Left 09/16/2021   Procedure: PERIPHERAL VASCULAR BALLOON ANGIOPLASTY;  Surgeon: Victorino Sparrow, MD;  Location: [facility]  INVASIVE CV LAB;  Service: Cardiovascular;  Laterality: Left;  attempted to PTA posterior tibial artery; unable to cross lesion   TEE WITHOUT CARDIOVERSION N/A 04/21/2022   Procedure: TRANSESOPHAGEAL ECHOCARDIOGRAM (TEE);  Surgeon: [name], MD;  Location: [facility] ENDOSCOPY;  Service: Cardiovascular;  Laterality: N/A;     reports that he has never smoked. He has never used smokeless tobacco. He reports that he does not drink alcohol and does not use drugs.  Allergies  Allergen Reactions   Amlodipine Swelling and Other (See Comments)    Legs became swollen   Onglyza [Saxagliptin] Other (See Comments)    "Allergic," per [facility]   Zyprexa [Olanzapine] Other (See Comments)    Allergy noted by ACT Team. No reaction specified (??)- "Allergic," per [facility]   Ritalin [Methylphenidate] Anxiety and Other (See Comments)    "Maniac"    Family History  Problem Relation Age of Onset   Dementia Mother    Arthritis Father    Hypertension Father    Mental illness Sister    Diabetes Sister    Heart failure Neg Hx    Kidney failure Neg Hx    Cancer Neg Hx     Prior to Admission medications   Medication Sig Start Date End Date Taking? Authorizing Provider  acetaminophen (TYLENOL) 500 MG tablet Take 1,000 mg by mouth daily as needed (pain.).   Yes [provider]  ARIPiprazole (ABILIFY) 10 MG tablet Take 1 tablet (10 mg total) by mouth every morning. 04/29/22  Yes Lanae Boast, MD  atorvastatin (LIPITOR) 10 MG tablet Take 1 tablet (10 mg total) by mouth daily at 6 PM. Patient taking differently: Take 10 mg by mouth every evening. 02/05/20  Yes Starkes-Perry, Juel Burrow, FNP  benztropine (COGENTIN) 1 MG tablet Take 1 tablet (1 mg total) by mouth 2 (two) times daily. 04/29/22  Yes Lanae Boast, MD  bisacodyl (DULCOLAX) 5 MG EC tablet Take 2 tablets (10 mg total) by mouth daily as needed for moderate constipation. 04/29/22  Yes Lanae Boast, MD  HYDROcodone-acetaminophen  (NORCO/VICODIN) 5-325 MG tablet Take 1 tablet by mouth every 6 (six) hours as needed for up to 4 doses for moderate pain. 04/29/22  Yes Kc, Dayna Barker, MD  insulin aspart (NOVOLOG) 100 UNIT/ML FlexPen Before each meal 3 times a day, 140-199 - 2 units, 200-250 - 4 units, 251-299 - 6 units,  300-349 - 8 units,  350 or above 10 units. Insulin PEN if approved, provide syringes and needles if needed. 05/20/22  Yes Leroy Sea, MD  insulin glargine-yfgn (SEMGLEE) 100 UNIT/ML injection Inject 0.15 mLs (15 Units total) into the skin 2 (two) times daily. 05/20/22  Yes Leroy Sea, MD  liothyronine (CYTOMEL) 5 MCG tablet Take 10 mcg by mouth daily.   Yes [provider]  melatonin 10 MG TABS Take 10 mg by mouth at bedtime as needed (sleep). 04/29/22  Yes Lanae Boast, MD  metoprolol succinate (TOPROL-XL) 50 MG 24 hr tablet Take 1 tablet (50 mg total) by mouth daily. Take with or immediately following a meal. Patient taking differently: Take 50 mg by mouth daily. 01/28/22 03/13/23 Yes Marinda Elk, MD  Nutritional Supplements (FEEDING SUPPLEMENT, GLUCERNA 1.5 CAL,) LIQD Place 1,000 mLs into feeding tube daily. 05/20/22  Yes Leroy Sea, MD  Nutritional Supplements (FEEDING SUPPLEMENT, PROSOURCE TF,) liquid Place 45 mLs into feeding tube 3 (three) times daily. 05/20/22  Yes Leroy Sea, MD  polyethylene glycol powder (GLYCOLAX/MIRALAX) 17 GM/SCOOP powder Take 17 g by mouth daily. Patient taking differently: Take 17 g by mouth daily as needed for mild constipation. 10/14/20  Yes Ghimire, Werner Lean, MD  rivaroxaban (XARELTO) 20 MG TABS tablet Take 1 tablet (20 mg total) by mouth daily with supper. Patient taking differently: Take 20 mg by mouth daily. 04/02/20  Yes Dhungel, Nishant, MD  senna-docusate (SENOKOT-S) 8.6-50 MG tablet Take 2 tablets by mouth 2 (two) times daily. 04/29/22  Yes Lanae Boast, MD  tamsulosin (FLOMAX) 0.4 MG CAPS capsule Take 1 capsule (0.4 mg total) by mouth daily. 05/21/22   Yes Leroy Sea, MD  Vitamin D, Ergocalciferol, (DRISDOL) 1.25 MG (50000 UNIT) CAPS capsule Take 50,000 Units by mouth every 7 (seven) days.   Yes [provider]  Water For Irrigation, Sterile (FREE WATER) SOLN Place 150 mLs into feeding tube every 6 (six) hours. 05/20/22  Yes Leroy Sea, MD    Physical Exam: Exam limited by patient's mental status  Vitals:   05/27/22 2010 05/27/22 2018 05/27/22 2100 05/27/22 2128  BP:   110/78 95/60  Pulse: (!) 140  65 (!) 134  Resp:   (!) 36 (!) 24  Temp:  (!) 103.1 F (39.5 C) (!) 102.6 F (39.2 C) (!) 100.8 F (38.2 C)  TempSrc:  Oral Oral   SpO2:   97% 99%  Weight:      Height:        Constitutional: Lethargic, awake but not responding Vitals:   05/27/22 2010 05/27/22 2018 05/27/22 2100 05/27/22 2128  BP:   110/78 95/60  Pulse: (!) 140  65 (!) 134  Resp:   (!) 36 (!) 24  Temp:  (!) 103.1 F (39.5 C) (!) 102.6 F (39.2 C) (!) 100.8 F (38.2 C)  TempSrc:  Oral Oral   SpO2:   97% 99%  Weight:      Height:       Eyes: Pupils equal, lids and conjunctivae normal ENMT: Unable to examine, patient not following directions Neck: normal, supple, no masses, no thyromegaly Respiratory: clear to auscultation bilaterally, no wheezing, no crackles. Normal respiratory effort. No accessory muscle use.  Cardiovascular: Tachycardic, regular rate and rhythm, no murmurs / rubs / gallops. No extremity edema. 2 Abdomen: no tenderness, no masses palpated. No hepatosplenomegaly. Bowel sounds positive.  Foley catheter. Musculoskeletal: no clubbing / cyanosis. No joint deformity upper and lower extremities.  Heel lift offloading boots on bilateral lower extremities, Skin: Large sacral decubitus ulcer Neurologic: Unable to fully examine.  Psychiatric: Awake, lethargic not following directions.     Labs on Admission: I have personally reviewed following labs and imaging studies  CBC: Recent Labs  Lab 05/27/22 1332  WBC 8.9   NEUTROABS 7.6  HGB 13.5  HCT 44.0  MCV 88.2  PLT 399   Basic Metabolic Panel: Recent Labs  Lab 05/27/22 1332  NA 132*  K 5.4*  CL 99  CO2 25  GLUCOSE 272*  BUN 69*  CREATININE 1.38*  CALCIUM 9.5   GFR: Estimated Creatinine Clearance: 73.2 mL/min (A) (by C-G formula based on SCr of 1.38 mg/dL (H)). Liver Function Tests: Recent Labs  Lab 05/27/22 1332  AST 50*  ALT 72*  ALKPHOS 80  BILITOT 0.5  PROT 8.2*  ALBUMIN 2.6*   Coagulation Profile: Recent Labs  Lab  05/27/22 1332  INR 1.4*   Urine analysis:    Component Value Date/Time   COLORURINE YELLOW 05/27/2022 1147   APPEARANCEUR CLOUDY (A) 05/27/2022 1147   APPEARANCEUR Cloudy 08/01/2014 1614   LABSPEC 1.016 05/27/2022 1147   LABSPEC 1.008 08/01/2014 1614   PHURINE 8.0 05/27/2022 1147   GLUCOSEU NEGATIVE 05/27/2022 1147   GLUCOSEU Negative 08/01/2014 1614   HGBUR NEGATIVE 05/27/2022 1147   BILIRUBINUR NEGATIVE 05/27/2022 1147   BILIRUBINUR Negative 08/01/2014 1614   KETONESUR NEGATIVE 05/27/2022 1147   PROTEINUR 30 (A) 05/27/2022 1147   UROBILINOGEN 0.2 07/01/2014 1809   NITRITE NEGATIVE 05/27/2022 1147   LEUKOCYTESUR LARGE (A) 05/27/2022 1147   LEUKOCYTESUR Negative 08/01/2014 1614    Radiological Exams on Admission: CT Angio Chest PE W and/or Wo Contrast  Result Date: 05/27/2022 CLINICAL DATA:  Persistent tachycardia. EXAM: CT ANGIOGRAPHY CHEST WITH CONTRAST TECHNIQUE: Multidetector CT imaging of the chest was performed using the standard protocol during bolus administration of intravenous contrast. Multiplanar CT image reconstructions and MIPs were obtained to evaluate the vascular anatomy. RADIATION DOSE REDUCTION: This exam was performed according to the departmental dose-optimization program which includes automated exposure control, adjustment of the mA and/or kV according to patient size and/or use of iterative reconstruction technique. CONTRAST:  54mL OMNIPAQUE IOHEXOL 350 MG/ML SOLN COMPARISON:   Apr 19, 2022 FINDINGS: Cardiovascular: Enlarged heart. Heavy calcific atherosclerotic disease of the coronary arteries. Mediastinum/Nodes: No enlarged mediastinal, hilar, or axillary lymph nodes. Thyroid gland, trachea, and esophagus demonstrate no significant findings. Lungs/Pleura: Scarring versus atelectasis in bilateral lung bases. Biapical pleural thickening, left greater than right. Areas of ground-glass opacities in the left lung apex with associated mild architectural distortion. Upper Abdomen: No acute abnormality. Musculoskeletal: No chest wall abnormality. No acute or significant osseous findings. Review of the MIP images confirms the above findings. IMPRESSION: 1. No evidence of pulmonary embolism. 2. Enlarged heart with heavy calcific atherosclerotic disease of the coronary arteries. 3. Scarring versus atelectasis in bilateral lung bases. 4. Areas of ground-glass opacities in the left lung apex with associated mild architectural distortion. This finding has stable since 2019, which supports scarring. 5. Biapical pleural thickening, left greater than right. Electronically Signed   By: Ted Mcalpine M.D.   On: 05/27/2022 17:28   CT ABDOMEN PELVIS W CONTRAST  Result Date: 05/27/2022 CLINICAL DATA:  Abdominal pain.  Sacral ulcer.  Fever. EXAM: CT ABDOMEN AND PELVIS WITH CONTRAST TECHNIQUE: Multidetector CT imaging of the abdomen and pelvis was performed using the standard protocol following bolus administration of intravenous contrast. RADIATION DOSE REDUCTION: This exam was performed according to the departmental dose-optimization program which includes automated exposure control, adjustment of the mA and/or kV according to patient size and/or use of iterative reconstruction technique. CONTRAST:  87mL OMNIPAQUE IOHEXOL 350 MG/ML SOLN COMPARISON:  CT abdomen and pelvis 04/13/2022 FINDINGS: Lower chest: There is some atelectasis in the lung bases. Hepatobiliary: No focal liver abnormality is seen.  No gallstones, gallbladder wall thickening, or biliary dilatation. Pancreas: Unremarkable. No pancreatic ductal dilatation or surrounding inflammatory changes. Spleen: Normal in size without focal abnormality. Adrenals/Urinary Tract: Again seen is an extrarenal pelvis on the left. There is no hydronephrosis or perinephric fluid. The adrenal glands are within normal limits. The bladder is decompressed by Foley catheter. Stomach/Bowel: Rectal wall thickening with surrounding inflammation and presacral edema appears unchanged. There is no evidence for bowel obstruction or free air. There is a large amount of stool throughout the colon. Appendix is within normal limits. Small bowel  is within normal limits. Percutaneous gastrostomy tube in place, new from prior. Stomach is nondilated. Vascular/Lymphatic: There are severe atherosclerotic calcifications of the aorta. Peripheral vascular calcifications are also seen. No evidence for aortic aneurysm. No enlarged lymph nodes are identified. Reproductive: Prostate is unremarkable. Other: No ascites or focal abdominal wall hernia. Musculoskeletal: Sacral decubitus ulcer is again seen overlying the distal sacrum and coccyx. Previously identified fluid collection is no longer seen. The ulcer abuts the tip of the coccyx and distal sacrum with some questionable mild underlying erosive change of the coccyx. IMPRESSION: 1. Sacral decubitus ulcer again seen extending to the level of the distal sacrum and coccyx. There are erosive changes of the coccyx compatible with osteomyelitis. 2. Rectal wall thickening with surrounding inflammation persists concerning for proctitis. 3. Foley catheter in place. 4. New gastrostomy tube present. 5. Severe atherosclerotic disease. Aortic Atherosclerosis (ICD10-I70.0). Electronically Signed   By: Ronney Asters M.D.   On: 05/27/2022 17:25   DG Chest Port 1 View  Result Date: 05/27/2022 CLINICAL DATA:  Provided history: Fever, sepsis protocol. EXAM:  PORTABLE CHEST 1 VIEW COMPARISON:  Prior chest radiographs 05/19/2022 and earlier. FINDINGS: Shallow inspiration radiograph. Heart size within normal limits. Aortic atherosclerosis. No appreciable airspace consolidation or pulmonary edema. No evidence of pleural effusion or pneumothorax. No acute bony abnormality identified. Degenerative changes of the spine and left glenohumeral joint. IMPRESSION: Shallow inspiration radiograph. No evidence of acute cardiopulmonary abnormality. Aortic Atherosclerosis (ICD10-I70.0). Electronically Signed   By: Kellie Simmering D.O.   On: 05/27/2022 12:17    EKG: Independently reviewed.   Sinus tachycardia rate 126.  QTc 458.  No significant change from prior.  Assessment/Plan Principal Problem:   Severe sepsis (HCC) Active Problems:   Urinary tract infection associated with catheterization of urinary tract (HCC)   Encephalopathy   Sacral decubitus ulcer, stage IV (HCC)   Chronic diastolic CHF (congestive heart failure) ([place])   Hypertension   Type 2 diabetes mellitus with complication, without long-term current use of insulin (HCC)   OSA on CPAP   Diabetes mellitus type 2 in nonobese ([place])   Pulmonary embolism (HCC)   Assessment and Plan: * Severe sepsis ([place]) Meeting sepsis criteria with fever of 103, tachycardia heart rates up to 140s, tachypnea to 36.  With evidence of endorgan dysfunction, and acute metabolic encephalopathy and possibly AKI.  WBC 8.9.  Source of infection likely sacral decubitus ulcer which is showing new osteomyelitis not present from prior imaging, and possible catheter associated UTI.  No suggestion of infection in the chest or abdomen. -Broad-spectrum antibiotics IV vancomycin, cefepime and metronidazole -Follow-up blood cultures  -Add on urine cultures  -3 L bolus given, continue LR 100cc/hr x 1 day   Sacral decubitus ulcer, stage IV (HCC) Large stage IV sacral decubitus ulcer.  Patient nonambulatory.  Poor functional status and  debility after 2 prolonged recent hospitalizations in April and then May to June.  CT suggesting osteomyelitis of the coccyx.  No abscess. -Considering baseline poor functional status, deferred admission to [facility] for now -Will consult general surgery if debridement is indicated or needed, and if culture sample can be obtained -Broad-spectrum antibiotics -Palliative care consult - May need Tele- ID guidance with antibiotics  Encephalopathy Acute metabolic encephalopathy likely secondary to severe sepsis. -Obtain CT head -Hold psychoactive medications-Abilify, benztropine narcotics for now   Urinary tract infection associated with catheterization of urinary tract ([place]) Catheter associated UTI.  Foley placed during recent hospitalization due to urine acute urinary retention, was  to follow-up with urology as outpatient.  Presented with severe sepsis. -Broad-spectrum antibiotics -Follow-up urine cultures-add-on  Pulmonary embolism (HCC) Hold Xarelto for now while NPO -S/c Lovenox for DVT prophylaxis, may need to change to therapeutic dose, if mental status does not improve  Type 2 diabetes mellitus with complication, without long-term current use of insulin (HCC) Controlled, A1c 7.6.  Glucose 272.   -Hold home long-acting insulin 15 units twice daily - SSI- S   Hypertension Stable. -Hold metoprolol 50 mg daily while n.p.o. -As needed labetalol for systolic greater than [id number]   DVT prophylaxis: Lovenox Code Status: Full Code Family Communication:  None at bedside. Disposition Plan: > 2 days Consults called: Gen Surg Admission status: Inpt stepdown I certify that at the point of admission it is my clinical judgment that the patient will require inpatient hospital care spanning beyond 2 midnights from the point of admission due to high intensity of service, high risk for further deterioration and high frequency of surveillance required.     Author: Bethena Roys,  MD 05/27/2022 11:38 PM  For on call review [email or web address].

## 2022-05-27 NOTE — ED Notes (Signed)
Aux temp 102.3- Dr Joylene Grapes made aware- new verbal order received.

## 2022-05-27 NOTE — Assessment & Plan Note (Addendum)
Meeting sepsis criteria with fever of 103, tachycardia heart rates up to 140s, tachypnea to 36.  With evidence of endorgan dysfunction, and acute metabolic encephalopathy and possibly AKI.  WBC 8.9.  Source of infection likely sacral decubitus ulcer which is showing new osteomyelitis not present from prior imaging, and possible catheter associated UTI.  No suggestion of infection in the chest or abdomen. -Broad-spectrum antibiotics IV vancomycin, cefepime and metronidazole -Follow-up blood cultures  -Add on urine cultures  -3 L bolus given, continue LR 100cc/hr x 1 day

## 2022-05-27 NOTE — Progress Notes (Signed)
Pharmacy Antibiotic Note  Christian [name] is a 71 y.o. male admitted on 05/27/2022 with  ongoing wound infection .  Pharmacy has been consulted for Vancomycin / Cefepime dosing.  Plan: Vancomycin 2 grams iv x 1 dose now then 1750 mg iv Q 24 (AUC of 503 using Scr 1.38)  Cefepime 2 grams iv Q 8 hours Follow up Scr, cultures  Height: 6' (182.9 cm) Weight: 136 kg (299 lb 13.2 oz) IBW/kg (Calculated) : 77.6  Temp (24hrs), Avg:98.7 F (37.1 C), Min:98.7 F (37.1 C), Max:98.7 F (37.1 C)  Recent Labs  Lab 05/27/22 1332 05/27/22 1543  WBC 8.9  --   CREATININE 1.38*  --   LATICACIDVEN 1.5 1.1    Estimated Creatinine Clearance: 73.2 mL/min (A) (by C-G formula based on SCr of 1.38 mg/dL (H)).    Allergies  Allergen Reactions   Amlodipine Swelling and Other (See Comments)    Legs became swollen   Onglyza [Saxagliptin] Other (See Comments)    "Allergic," per [facility]   Zyprexa [Olanzapine] Other (See Comments)    Allergy noted by ACT Team. No reaction specified (??)- "Allergic," per [facility]   Ritalin [Methylphenidate] Anxiety and Other (See Comments)    "[place]"    Thank you for allowing pharmacy to be a part of this patient's care.  Elwin Sleight 05/27/2022 5:53 PM

## 2022-05-27 NOTE — Assessment & Plan Note (Signed)
Hold Xarelto for now while NPO -S/c Lovenox for DVT prophylaxis, may need to change to therapeutic dose, if mental status does not improve

## 2022-05-27 NOTE — ED Notes (Signed)
pt alert-will open eyes to voice and pain, non-verbal, unable to follow commands, pt skin hot to touch. Pt appears uncomfortable and lethargic. Dr Josie Dixon at bedside.

## 2022-05-27 NOTE — Assessment & Plan Note (Addendum)
Catheter associated UTI.  Foley placed during recent hospitalization due to urine acute urinary retention, was to follow-up with urology as outpatient.  Presented with severe sepsis. -Broad-spectrum antibiotics -Follow-up urine cultures-add-on

## 2022-05-27 NOTE — Assessment & Plan Note (Signed)
Acute metabolic encephalopathy likely secondary to severe sepsis. -Obtain CT head -Hold psychoactive medications-Abilify, benztropine narcotics for now

## 2022-05-27 NOTE — Assessment & Plan Note (Signed)
Controlled, A1c 7.6.  Glucose 272.   -Hold home long-acting insulin 15 units twice daily - SSI- S

## 2022-05-28 ENCOUNTER — Inpatient Hospital Stay (HOSPITAL_COMMUNITY): Payer: Medicare HMO

## 2022-05-28 DIAGNOSIS — R6520 Severe sepsis without septic shock: Secondary | ICD-10-CM | POA: Diagnosis not present

## 2022-05-28 DIAGNOSIS — E1169 Type 2 diabetes mellitus with other specified complication: Secondary | ICD-10-CM

## 2022-05-28 DIAGNOSIS — M8618 Other acute osteomyelitis, other site: Secondary | ICD-10-CM | POA: Diagnosis not present

## 2022-05-28 DIAGNOSIS — L89159 Pressure ulcer of sacral region, unspecified stage: Secondary | ICD-10-CM | POA: Diagnosis not present

## 2022-05-28 DIAGNOSIS — A419 Sepsis, unspecified organism: Secondary | ICD-10-CM | POA: Diagnosis not present

## 2022-05-28 DIAGNOSIS — R7881 Bacteremia: Secondary | ICD-10-CM

## 2022-05-28 LAB — CBC
HCT: 42.4 % (ref 39.0–52.0)
Hemoglobin: 13.1 g/dL (ref 13.0–17.0)
MCH: 27.6 pg (ref 26.0–34.0)
MCHC: 30.9 g/dL (ref 30.0–36.0)
MCV: 89.3 fL (ref 80.0–100.0)
Platelets: 336 10*3/uL (ref 150–400)
RBC: 4.75 MIL/uL (ref 4.22–5.81)
RDW: 15 % (ref 11.5–15.5)
WBC: 10.8 10*3/uL — ABNORMAL HIGH (ref 4.0–10.5)
nRBC: 0 % (ref 0.0–0.2)

## 2022-05-28 LAB — COMPREHENSIVE METABOLIC PANEL
ALT: 100 U/L — ABNORMAL HIGH (ref 0–44)
AST: 109 U/L — ABNORMAL HIGH (ref 15–41)
Albumin: 2.2 g/dL — ABNORMAL LOW (ref 3.5–5.0)
Alkaline Phosphatase: 71 U/L (ref 38–126)
Anion gap: 6 (ref 5–15)
BUN: 55 mg/dL — ABNORMAL HIGH (ref 8–23)
CO2: 25 mmol/L (ref 22–32)
Calcium: 9.3 mg/dL (ref 8.9–10.3)
Chloride: 106 mmol/L (ref 98–111)
Creatinine, Ser: 1.17 mg/dL (ref 0.61–1.24)
GFR, Estimated: >60 mL/min (ref >60)
Glucose, Bld: 167 mg/dL — ABNORMAL HIGH (ref 70–99)
Potassium: 4.8 mmol/L (ref 3.5–5.1)
Sodium: 137 mmol/L (ref 135–145)
Total Bilirubin: 0.6 mg/dL (ref 0.3–1.2)
Total Protein: 7.5 g/dL (ref 6.5–8.1)

## 2022-05-28 LAB — GLUCOSE, CAPILLARY
Glucose-Capillary: 139 mg/dL — ABNORMAL HIGH (ref 70–99)
Glucose-Capillary: 141 mg/dL — ABNORMAL HIGH (ref 70–99)
Glucose-Capillary: 150 mg/dL — ABNORMAL HIGH (ref 70–99)
Glucose-Capillary: 151 mg/dL — ABNORMAL HIGH (ref 70–99)
Glucose-Capillary: 165 mg/dL — ABNORMAL HIGH (ref 70–99)
Glucose-Capillary: 186 mg/dL — ABNORMAL HIGH (ref 70–99)

## 2022-05-28 LAB — HIV ANTIBODY (ROUTINE TESTING W REFLEX): HIV Screen 4th Generation wRfx: NONREACTIVE

## 2022-05-28 MED ORDER — JUVEN PO PACK
1.0000 | PACK | Freq: Two times a day (BID) | ORAL | Status: DC
  Administered 2022-05-28 – 2022-06-04 (×14): 1
  Filled 2022-05-28 (×13): qty 1

## 2022-05-28 MED ORDER — PROSOURCE TF PO LIQD
45.0000 mL | Freq: Two times a day (BID) | ORAL | Status: DC
  Administered 2022-05-28 – 2022-06-04 (×15): 45 mL
  Filled 2022-05-28 (×15): qty 45

## 2022-05-28 MED ORDER — VITAL 1.5 CAL PO LIQD
1000.0000 mL | ORAL | Status: DC
  Administered 2022-05-28 – 2022-06-01 (×5): 1000 mL
  Filled 2022-05-28 (×14): qty 1000

## 2022-05-28 NOTE — TOC Initial Note (Addendum)
Transition of Care [facility]) - Initial/Assessment Note    Patient Details  Name: Christian [name] MRN: [id number] Date of Birth: Apr 17, 1951  Transition [facility] CM/SW Contact:    Leitha Bleak, RN Phone Number: 05/28/2022, 1:00 PM  Clinical Narrative:      Patient admitted with severe sepsis. Patient is from [facility]. TOC reached out to [facility], he can come back, he will need PT eval and INS AUTH. TOC was asked to call Alesia Banda for his baseline. She states he did well the first couple days. Then he declined and not doing as much with PT. Family has noticed the decline. They all believe he can do more. Family wanting him to continue with PT. Patient has wounds. TOC requested PT eval.   Addendum : PT recommended SNF - INS Auth started.  Expected Discharge Plan: Skilled Nursing Facility Barriers to Discharge: Continued Medical Work up   Patient Goals and CMS Choice Patient states their goals for this hospitalization and ongoing recovery are:: to return to SNF. CMS Medicare.gov Compare Post Acute Care list provided to:: Other (Comment Required) (BCY)    Expected Discharge Plan and Services Expected Discharge Plan: Skilled Nursing Facility     Post Acute Care Choice: Skilled Nursing Facility Living arrangements for the past 2 months: Skilled Nursing Facility         Prior Living Arrangements/Services Living arrangements for the past 2 months: Skilled Nursing Facility     Activities of Daily Living Home Assistive Devices/Equipment: None ADL Screening (condition at time of admission) Patient's cognitive ability adequate to safely complete daily activities?: No Is the patient deaf or have difficulty hearing?: No Does the patient have difficulty seeing, even when wearing glasses/contacts?: No Does the patient have difficulty concentrating, remembering, or making decisions?: Yes Patient able to express need for assistance with ADLs?: No Does the patient have  difficulty dressing or bathing?: Yes Independently performs ADLs?: No Communication: Needs assistance Is this a change from baseline?: Pre-admission baseline Dressing (OT): Needs assistance Is this a change from baseline?: Pre-admission baseline Grooming: Needs assistance Is this a change from baseline?: Pre-admission baseline Feeding: Needs assistance Is this a change from baseline?: Pre-admission baseline Bathing: Needs assistance Is this a change from baseline?: Pre-admission baseline Toileting: Needs assistance Is this a change from baseline?: Pre-admission baseline In/Out Bed: Needs assistance Is this a change from baseline?: Pre-admission baseline Walks in Home: Needs assistance Is this a change from baseline?: Pre-admission baseline Does the patient have difficulty walking or climbing stairs?: Yes Weakness of Legs: Both Weakness of Arms/Hands: None  Permission Sought/Granted     Emotional Assessment    Orientation: : Oriented to Self Alcohol / Substance Use: Not Applicable Psych Involvement: No (comment)  Admission diagnosis:  Lethargy [R53.83] Severe sepsis (HCC) [A41.9, R65.20] Fever, unspecified fever cause [R50.9] Urinary tract infection associated with indwelling urethral catheter, initial encounter (HCC) [M42.683M, N39.0] Osteomyelitis, unspecified site, unspecified type (HCC) [M86.9] Patient Active Problem List   Diagnosis Date Noted   Severe sepsis (HCC) 05/27/2022   Sacral decubitus ulcer, stage IV (HCC) 05/27/2022   Hypoglycemia 03/12/2022   Cellulitis in diabetic foot (HCC) 01/26/2022   Diabetic ulcer of ankle (HCC) 01/26/2022   Severe sepsis without septic shock (HCC) 04/02/2021   Lactic acidosis 04/02/2021   Hyponatremia 04/02/2021   Hyperkalemia 04/02/2021   Chronic kidney disease, stage 3b (HCC) 04/02/2021   Fever 04/02/2021   Septic shock (HCC) 04/02/2021   Altered mental status    AKI (acute kidney  injury) (HCC)    CVA (cerebral vascular  accident) (HCC) 10/07/2020   Type 2 diabetes mellitus with stage 3 chronic kidney disease (HCC) 04/01/2020   CAD (coronary artery disease), native coronary artery 04/01/2020   NSTEMI (non-ST elevated myocardial infarction) (HCC) 03/31/2020   Obesity, Class III, BMI 40-49.9 (morbid obesity) (HCC) 03/31/2020   Aspiration pneumonia of both lower lobes due to gastric secretions (HCC) 03/21/2020   Sacral decubitus ulcer, stage II (HCC) 03/19/2020   Chronic diastolic CHF (congestive heart failure) (HCC) 03/17/2020   Atrial flutter, paroxysmal (HCC) 12/27/2019   Constipation 09/16/2018   Bipolar 1 disorder, depressed (HCC) 08/22/2018   Encephalopathy    Paroxysmal atrial fibrillation (HCC) 06/18/2018   History of pulmonary embolism 06/17/2018   Generalized weakness 03/27/2018   Diabetes mellitus type 2 in nonobese (HCC) 03/27/2018   Pulmonary embolism (HCC) 03/27/2018   Fall    Laceration of eyebrow    Bipolar I disorder, most recent episode depressed, severe w psychosis (HCC) 12/22/2015   Bipolar affective disorder, depressed, severe, with psychotic behavior (HCC)    Bipolar I disorder, current or most recent episode depressed, with psychotic features (HCC)    Severe bipolar I disorder with depression (HCC)    Bipolar I disorder, most recent episode depressed, severe with psychotic features (HCC)    Overdose 09/11/2015   Acute respiratory failure with hypoxia (HCC) 09/05/2015   Elevated troponin 09/05/2015   Somnolence 09/05/2015   Bipolar depression (HCC) 09/05/2015   Acute bilateral deep vein thrombosis (DVT) of femoral veins (HCC) 09/05/2015   Bilateral pulmonary embolism (HCC) 09/02/2015   Labile hypertension    Pulmonary embolism with acute cor pulmonale (HCC)    Dyspnea    Orthostatic hypotension    Bipolar disorder, in partial remission, most recent episode depressed (HCC)    Syncope, near 08/31/2015   Bipolar and related disorder (HCC)    Bipolar affective disorder, depressed,  mild (HCC) 08/28/2015   Pressure ulcer 07/30/2015   Protein-calorie malnutrition, severe (HCC) 07/30/2015   Dehydration 07/29/2015   Diabetes (HCC) 07/09/2015   Urinary tract infection associated with catheterization of urinary tract (HCC) 06/19/2014   Bacteremia 06/19/2014   Bipolar affective disorder, current episode manic with psychotic symptoms (HCC) 06/17/2014   Type 2 diabetes mellitus with complication, without long-term current use of insulin (HCC) 06/02/2014   Other and unspecified hyperlipidemia 06/02/2014   OSA on CPAP 06/02/2014   Bilateral lower extremity edema: chronic with venous stasis changes 06/02/2014   Gout 06/02/2014   Hypertension    PCP:  [name], MD Pharmacy:   [facility] - [place], [place] - [address] [place] [address] Phone: [phone] Fax: [phone]  Readmission Risk Interventions    05/28/2022   12:57 PM 05/14/2022   12:39 PM 04/13/2022    3:06 PM  Readmission Risk Prevention Plan  Transportation Screening Complete Complete   PCP or Specialist Appt within 3-5 Days Complete  Complete  HRI or Home Care Consult Complete  Complete  Social Work Consult for Recovery Care Planning/Counseling   Complete  Palliative Care Screening Not Complete  Not Applicable  Medication Review Oceanographer) Complete Complete Referral to Pharmacy  PCP or Specialist appointment within 3-5 days of discharge  Complete   HRI or Home Care Consult  Complete   Palliative Care Screening  Not Applicable   Skilled Nursing Facility  Complete

## 2022-05-28 NOTE — Plan of Care (Signed)

## 2022-05-28 NOTE — Evaluation (Signed)
Physical Therapy Evaluation Patient Details Name: Christian [name] MRN: [id number] DOB: 01-03-1951 Today's Date: 05/28/2022  History of Present Illness  Christian [name] is a 71 y.o. male with medical history significant for diabetes mellitus, CVA, bipolar disorder, coronary artery disease, PE and DVT, CKD 3, atrial fibrillation, OSA, gastrostomy tube and chronic Foley catheter.  Patient reports to the ED from nursing home reports of altered mental status, increasing lethargy and fever, with tachycardia heart rate in the 130s.  At the time of my evaluation, patient is awake, is not and has not been responding to questions or following directions in the ED.  ED provider attempted to reach nursing home, was unsuccessful.  I am unable to reach nursing home.  Family was at bedside and reported that at baseline he is generally quiet, but somewhat conversant and interactive.  G-tube was placed last week.   Clinical Impression  Patient demonstrates slow labored movement for sitting up at bedside with mostly difficulty moving legs due to weakness, initially once seated had frequent falling backwards, but able to keep trunk in midline most of time after flexing trunk/stretching low back and having patient hold onto his knees with hands.  Patient unable to scoot laterally or complete sit to stands due to BLE weakness and required Max assist to reposition when put back to bed and left in side lying position - RN notified.  Patient will benefit from continued skilled physical therapy in hospital and recommended venue below to increase strength, balance, endurance for safe ADLs and gait.         Recommendations for follow up therapy are one component of a multi-disciplinary discharge planning process, led by the attending physician.  Recommendations may be updated based on patient status, additional functional criteria and insurance authorization.  Follow Up Recommendations Skilled nursing-short term rehab (<3  hours/day) Can patient physically be transported by private vehicle: No    Assistance Recommended at Discharge Intermittent Supervision/Assistance  Patient can return home with the following  Help with stairs or ramp for entrance;A lot of help with bathing/dressing/bathroom;Assistance with cooking/housework;Two people to help with walking and/or transfers    Equipment Recommendations None recommended by PT  Recommendations for Other Services       Functional Status Assessment Patient has had a recent decline in their functional status and demonstrates the ability to make significant improvements in function in a reasonable and predictable amount of time.     Precautions / Restrictions Precautions Precautions: Fall Precaution Comments: monitor HR, G-tube, Stage IV decubitus ulcer and R heel deep tissue pressure injury Restrictions Weight Bearing Restrictions: No      Mobility  Bed Mobility Overal bed mobility: Needs Assistance Bed Mobility: Supine to Sit, Sit to Supine, Rolling Rolling: Max assist   Supine to sit: Max assist Sit to supine: Max assist   General bed mobility comments: able to hold onto bedrail during sit to supine    Transfers                        Ambulation/Gait                  Stairs            Wheelchair Mobility    Modified Rankin (Stroke Patients Only)       Balance Overall balance assessment: Needs assistance Sitting-balance support: Feet supported, Bilateral upper extremity supported Sitting balance-Leahy Scale: Fair Sitting balance - Comments: fair/poor seated at EOB  Pertinent Vitals/Pain Pain Assessment Pain Assessment: Faces Faces Pain Scale: Hurts little more Pain Location: BLE Movement, buttocks Pain Descriptors / Indicators: Discomfort, Guarding, Grimacing Pain Intervention(s): Limited activity within patient's tolerance, Monitored during session,  Repositioned    Home Living Family/patient expects to be discharged to:: Assisted living   Available Help at Discharge: Available PRN/intermittently;Personal care attendant Type of Home: Assisted living Home Access: Level entry       Home Layout: One level Home Equipment: Rollator (4 wheels);Shower seat;Grab bars - toilet;Grab bars - tub/shower;Wheelchair - Careers adviser (comment)      Prior Function Prior Level of Function : Needs assist       Physical Assist : Mobility (physical) Mobility (physical): Bed mobility;Transfers;Gait   Mobility Comments: assisted for transfers ADLs Comments: ALF assisting with ADLs     Hand Dominance   Dominant Hand: Right    Extremity/Trunk Assessment   Upper Extremity Assessment Upper Extremity Assessment: Defer to OT evaluation    Lower Extremity Assessment Lower Extremity Assessment: Generalized weakness    Cervical / Trunk Assessment Cervical / Trunk Assessment: Normal  Communication   Communication: Expressive difficulties  Cognition Arousal/Alertness: Awake/alert Behavior During Therapy: Flat affect Overall Cognitive Status: History of cognitive impairments - at baseline                                          General Comments      Exercises     Assessment/Plan    PT Assessment Patient needs continued PT services  PT Problem List Decreased strength;Decreased activity tolerance;Decreased mobility;Decreased coordination;Decreased balance;Decreased skin integrity       PT Treatment Interventions DME instruction;Functional mobility training;Therapeutic activities;Therapeutic exercise;Balance training;Patient/family education;Neuromuscular re-education    PT Goals (Current goals can be found in the Care Plan section)  Acute Rehab PT Goals Patient Stated Goal: return home PT Goal Formulation: With patient Time For Goal Achievement: 06/11/22 Potential to Achieve Goals: Good    Frequency Min  3X/week     Co-evaluation               AM-PAC PT "6 Clicks" Mobility  Outcome Measure Help needed turning from your back to your side while in a flat bed without using bedrails?: A Lot Help needed moving from lying on your back to sitting on the side of a flat bed without using bedrails?: A Lot Help needed moving to and from a bed to a chair (including a wheelchair)?: Total Help needed standing up from a chair using your arms (e.g., wheelchair or bedside chair)?: Total Help needed to walk in hospital room?: Total Help needed climbing 3-5 steps with a railing? : Total 6 Click Score: 8    End of Session   Activity Tolerance: Patient tolerated treatment well;Patient limited by fatigue Patient left: in bed;with call bell/phone within reach Nurse Communication: Mobility status PT Visit Diagnosis: Unsteadiness on feet (R26.81);Other abnormalities of gait and mobility (R26.89);Muscle weakness (generalized) (M62.81)    Time: 2703-5009 PT Time Calculation (min) (ACUTE ONLY): 28 min   Charges:   PT Evaluation $PT Eval Moderate Complexity: 1 Mod PT Treatments $Therapeutic Activity: 23-37 mins        12:29 PM, 05/28/22 Ocie Bob, MPT Physical Therapist with [facility] [phone] office [phone] mobile phone

## 2022-05-28 NOTE — Progress Notes (Signed)
Lab called with critical result:  2/4 blood cultures positive for Gram Positive Cocci, BCID is strep species. Dr. Thomes Dinning notified. NNO at this time.

## 2022-05-28 NOTE — Plan of Care (Signed)
  Problem: Acute Rehab PT Goals(only PT should resolve) Goal: Pt Will Go Supine/Side To Sit Outcome: Progressing Flowsheets (Taken 05/28/2022 1232) Pt will go Supine/Side to Sit: with moderate assist Goal: Patient Will Transfer Sit To/From Stand Outcome: Progressing Flowsheets (Taken 05/28/2022 1232) Patient will transfer sit to/from stand:  with maximum assist  with moderate assist Goal: Pt Will Transfer Bed To Chair/Chair To Bed Outcome: Progressing Flowsheets (Taken 05/28/2022 1232) Pt will Transfer Bed to Chair/Chair to Bed:  with mod assist  with max assist  12:33 PM, 05/28/22 Ocie Bob, MPT Physical Therapist with [facility] [phone] office [phone] mobile phone

## 2022-05-28 NOTE — Progress Notes (Addendum)
Initial Nutrition Assessment  DOCUMENTATION CODES:      INTERVENTION:  -Vital 1.5@ 40 ml/hr via PEG and increase by 10 ml every 8 hours to goal rate of 65 ml/hr (1560 ml).   45 ml Prostat BID.    Tube feeding regimen provides 2420 kcal, 127 grams of protein, and 1192 ml of H2O.    -1 packet Juven BID, each packet provides 95 calories, 2.5 grams of protein (collagen), to support wound healing- per tube.   NUTRITION DIAGNOSIS:   Increased nutrient needs related to wound healing as evidenced by estimated needs.   GOAL:  Provide needs based on ASPEN/SCCM guidelines  MONITOR:  Diet advancement, Labs, Skin, Weight trends, I & O's, TF tolerance  REASON FOR ASSESSMENT:   Consult Enteral/tube feeding initiation and management  ASSESSMENT: Patient is a 71 yo male with altered mental status and lethargy on admission-severe sepsis. Per MD- prolonged hospital stay 5/2->6/22 and also 4/13->4/21.   PMH: CHF, HTN, DM2, CKD, Gout. Chronic stage IV PI.   6/19 [facility]- IR G-tube placed. (20 french).   Patient NPO, lethargic. Talked with daughter/legal guardian by telephone. Patient was discharged to [facility] from [facility]. Also spoke with staff at Rehab center. No swallow problem reported - minimal po meal intake but drinking Ensure and other beverages during the and enteral feeding at night (12 hr).   Medications: insulin.  IVF- NS @100  ml/hr  Weights reviewed- currently 95.7 kg. Question wt 05/06/22- 136.1 kg. 03/11/22- 117.9 kg.   Intake/Output Summary (Last 24 hours) at 05/28/2022 1220 Last data filed at 05/28/2022 0650 Gross per 24 hour  Intake 2934.46 ml  Output 1800 ml  Net 1134.46 ml    CBG (last 3)  Recent Labs    05/28/22 0444 05/28/22 0755 05/28/22 1152  GLUCAP 141* 139* 151*        Latest Ref Rng & Units 05/28/2022    5:56 AM 05/27/2022    1:32 PM 05/19/2022   12:43 AM  BMP  Glucose 70 - 99 mg/dL 05/21/2022  779  390   BUN 8 - 23 mg/dL 55  69  58   Creatinine 0.61 - 1.24  mg/dL 300  9.23  3.00   Sodium 135 - 145 mmol/L 137  132  133   Potassium 3.5 - 5.1 mmol/L 4.8  5.4  4.4   Chloride 98 - 111 mmol/L 106  99  101   CO2 22 - 32 mmol/L 25  25  23    Calcium 8.9 - 10.3 mg/dL 9.3  9.5  9.4       NUTRITION - FOCUSED PHYSICAL EXAM:  Flowsheet Row Most Recent Value  Orbital Region No depletion  Upper Arm Region No depletion  Thoracic and Lumbar Region No depletion  Buccal Region Mild depletion  Temple Region No depletion  Clavicle Bone Region Mild depletion  Clavicle and Acromion Bone Region No depletion  Scapular Bone Region Unable to assess  Dorsal Hand No depletion  Patellar Region Unable to assess  Anterior Thigh Region Unable to assess  Posterior Calf Region Unable to assess  Edema (RD Assessment) Mild  Hair Reviewed  Eyes Unable to assess  Mouth Unable to assess  Skin Reviewed  Nails Reviewed       Diet Order:   Diet Order             Diet NPO time specified  Diet effective now  EDUCATION NEEDS:  Not appropriate for education at this time  Skin:  Skin Assessment: Skin Integrity Issues: Skin Integrity Issues:: Stage IV Stage IV: Chronic Stage 4 nonhealing pressure injury.  Last BM:  6/30 type 6 medium  Height:   Ht Readings from Last 1 Encounters:  05/27/22 6' (1.829 m)    Weight:   Wt Readings from Last 1 Encounters:  05/27/22 95.7 kg    Ideal Body Weight:   81 kg   BMI:  Body mass index is 28.61 kg/m.  Estimated Nutritional Needs:   Kcal:  2200-2400  Protein:  120-135 gr  Fluid:  2 liters daily  Royann Shivers MS,RD,CSG,LDN Contact: Loretha Stapler

## 2022-05-28 NOTE — Consult Note (Signed)
[facility] Consult  Reason for Consult: Sacral decubitus wound, osteomyelitis  Referring Physician:  Dr. Sherryll Burger   Chief Complaint   Fever     HPI: Christian [name] is a 71 y.o. male with CKD, CHF, OSA, HTN, DM, DVT and PE, who is on Xarelto and took it yesterday. He comes into the hospital with AMS and fever reported to 103.  He was admitted with septic shock and had his sacral wound evaluated with CT scan demonstrating no abscess but osteomyelitis. He has a chronic foley and G tube in place. He answers minimal questions but does say he got the Xarelto.   The wound RN has seen the wound electronically and recommended packing with saline as the wound is very close to his anus.  He has soiled himself multiple times this AM already.   Past Medical History:  Diagnosis Date   Bilateral lower extremity edema: chronic with venous stasis changes 06/02/2014   Bipolar 1 disorder (HCC)    Bipolar disorder (HCC)    CHF (congestive heart failure) (HCC)    Chronic kidney disease    Gout    Gout    Hx of blood clots    Hypertension    Mood swings    OSA on CPAP 06/02/2014   Other and unspecified hyperlipidemia 06/02/2014   Type II or unspecified type diabetes mellitus with unspecified complication, uncontrolled 06/02/2014    Past Surgical History:  Procedure Laterality Date   ABDOMINAL AORTOGRAM W/LOWER EXTREMITY N/A 09/16/2021   Procedure: ABDOMINAL AORTOGRAM W/LOWER EXTREMITY;  Surgeon: Victorino Sparrow, MD;  Location: [facility] INVASIVE CV LAB;  Service: Cardiovascular;  Laterality: N/A;   IR GASTROSTOMY TUBE MOD SED  05/17/2022   LEFT HEART CATH AND CORONARY ANGIOGRAPHY Left 04/01/2020   Procedure: LEFT HEART CATH AND CORONARY ANGIOGRAPHY;  Surgeon: Laurier Nancy, MD;  Location: [facility] INVASIVE CV LAB;  Service: Cardiovascular;  Laterality: Left;   PERIPHERAL VASCULAR BALLOON ANGIOPLASTY Left 09/16/2021   Procedure: PERIPHERAL VASCULAR BALLOON ANGIOPLASTY;  Surgeon: Victorino Sparrow,  MD;  Location: [facility] INVASIVE CV LAB;  Service: Cardiovascular;  Laterality: Left;  attempted to PTA posterior tibial artery; unable to cross lesion   TEE WITHOUT CARDIOVERSION N/A 04/21/2022   Procedure: TRANSESOPHAGEAL ECHOCARDIOGRAM (TEE);  Surgeon: [name], MD;  Location: [facility] ENDOSCOPY;  Service: Cardiovascular;  Laterality: N/A;    Family History  Problem Relation Age of Onset   Dementia Mother    Arthritis Father    Hypertension Father    Mental illness Sister    Diabetes Sister    Heart failure Neg Hx    Kidney failure Neg Hx    Cancer Neg Hx     Social History   Tobacco Use   Smoking status: Never   Smokeless tobacco: Never  Vaping Use   Vaping Use: Never used  Substance Use Topics   Alcohol use: No   Drug use: No    Medications: I have reviewed the patient's current medications. Prior to Admission:  Medications Prior to Admission  Medication Sig Dispense Refill Last Dose   acetaminophen (TYLENOL) 500 MG tablet Take 1,000 mg by mouth daily as needed (pain.).   UNK   ARIPiprazole (ABILIFY) 10 MG tablet Take 1 tablet (10 mg total) by mouth every morning.   05/27/2022   atorvastatin (LIPITOR) 10 MG tablet Take 1 tablet (10 mg total) by mouth daily at 6 PM. (Patient taking differently: Take 10 mg by mouth every evening.) 30 tablet 0  05/26/2022   benztropine (COGENTIN) 1 MG tablet Take 1 tablet (1 mg total) by mouth 2 (two) times daily.   05/27/2022   bisacodyl (DULCOLAX) 5 MG EC tablet Take 2 tablets (10 mg total) by mouth daily as needed for moderate constipation. 30 tablet 0 UNK   HYDROcodone-acetaminophen (NORCO/VICODIN) 5-325 MG tablet Take 1 tablet by mouth every 6 (six) hours as needed for up to 4 doses for moderate pain. 4 tablet 0 UNK   insulin aspart (NOVOLOG) 100 UNIT/ML FlexPen Before each meal 3 times a day, 140-199 - 2 units, 200-250 - 4 units, 251-299 - 6 units,  300-349 - 8 units,  350 or above 10 units. Insulin PEN if approved, provide syringes and  needles if needed. 15 mL 0 05/27/2022   insulin glargine-yfgn (SEMGLEE) 100 UNIT/ML injection Inject 0.15 mLs (15 Units total) into the skin 2 (two) times daily. 10 mL 0 05/27/2022   liothyronine (CYTOMEL) 5 MCG tablet Take 10 mcg by mouth daily.   05/27/2022   melatonin 10 MG TABS Take 10 mg by mouth at bedtime as needed (sleep).  0 UNK   metoprolol succinate (TOPROL-XL) 50 MG 24 hr tablet Take 1 tablet (50 mg total) by mouth daily. Take with or immediately following a meal. (Patient taking differently: Take 50 mg by mouth daily.) 30 tablet 0 05/27/2022 at 0900   Nutritional Supplements (FEEDING SUPPLEMENT, GLUCERNA 1.5 CAL,) LIQD Place 1,000 mLs into feeding tube daily.   UNK   Nutritional Supplements (FEEDING SUPPLEMENT, PROSOURCE TF,) liquid Place 45 mLs into feeding tube 3 (three) times daily.   UNK   polyethylene glycol powder (GLYCOLAX/MIRALAX) 17 GM/SCOOP powder Take 17 g by mouth daily. (Patient taking differently: Take 17 g by mouth daily as needed for mild constipation.) 255 g 0 05/27/2022   rivaroxaban (XARELTO) 20 MG TABS tablet Take 1 tablet (20 mg total) by mouth daily with supper. (Patient taking differently: Take 20 mg by mouth daily.) 30 tablet 0 05/26/2022 at 1700   senna-docusate (SENOKOT-S) 8.6-50 MG tablet Take 2 tablets by mouth 2 (two) times daily.   05/27/2022   tamsulosin (FLOMAX) 0.4 MG CAPS capsule Take 1 capsule (0.4 mg total) by mouth daily. 30 capsule  05/26/2022   Vitamin D, Ergocalciferol, (DRISDOL) 1.25 MG (50000 UNIT) CAPS capsule Take 50,000 Units by mouth every 7 (seven) days.   05/27/2022   Water For Irrigation, Sterile (FREE WATER) SOLN Place 150 mLs into feeding tube every 6 (six) hours.   UNK   Scheduled:  Chlorhexidine Gluconate Cloth  6 each Topical Q0600   enoxaparin (LOVENOX) injection  60 mg Subcutaneous QHS   insulin aspart  0-9 Units Subcutaneous Q4H   Continuous:  sodium chloride 100 mL/hr at 05/28/22 0228   ceFEPime (MAXIPIME) IV 200 mL/hr at 05/28/22  0650   metronidazole 500 mg (05/28/22 0917)   vancomycin     [id number] **OR** acetaminophen, ondansetron **OR** ondansetron (ZOFRAN) IV  Allergies  Allergen Reactions   Amlodipine Swelling and Other (See Comments)    Legs became swollen   Onglyza [Saxagliptin] Other (See Comments)    "Allergic," per [place]   Zyprexa [Olanzapine] Other (See Comments)    Allergy noted by ACT Team. No reaction specified (??)- "Allergic," per [place]   Ritalin [Methylphenidate] Anxiety and Other (See Comments)    "[place]"     ROS:  A comprehensive review of systems was negative except for: Constitutional: positive for AMS Musculoskeletal: positive for sacral wound  Blood  pressure 108/67, pulse (!) 109, temperature 97.9 F (36.6 C), temperature source Axillary, resp. rate 14, height 6' (1.829 m), weight 95.7 kg, SpO2 100 %. Physical Exam Vitals reviewed.  HENT:     Head: Atraumatic.     Nose: Nose normal.  Eyes:     Extraocular Movements: Extraocular movements intact.  Cardiovascular:     Rate and Rhythm: Normal rate.  Pulmonary:     Effort: Pulmonary effort is normal.  Abdominal:     General: There is no distension.     Palpations: Abdomen is soft.  Musculoskeletal:        General: Swelling present.     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  Neurological:     Mental Status: He is alert. Mental status is at baseline.  Psychiatric:        Mood and Affect: Mood normal.     Results: Results for orders placed or performed during the hospital encounter of 05/27/22 (from the past 48 hour(s))  Urinalysis, Routine w reflex microscopic Urine, Catheterized     Status: Abnormal   Collection Time: 05/27/22 11:47 AM  Result Value Ref Range   Color, Urine YELLOW YELLOW   APPearance CLOUDY (A) CLEAR   Specific Gravity,  Urine 1.016 1.005 - 1.030   pH 8.0 5.0 - 8.0   Glucose, UA NEGATIVE NEGATIVE mg/dL   Hgb urine dipstick NEGATIVE NEGATIVE   Bilirubin Urine NEGATIVE NEGATIVE   Ketones, ur NEGATIVE NEGATIVE mg/dL   Protein, ur 30 (A) NEGATIVE mg/dL   Nitrite NEGATIVE NEGATIVE   Leukocytes,Ua LARGE (A) NEGATIVE   RBC / HPF 6-10 0 - 5 RBC/hpf   WBC, UA >50 (H) 0 - 5 WBC/hpf   Bacteria, UA FEW (A) NONE SEEN   Squamous Epithelial / LPF 0-5 0 - 5   WBC Clumps PRESENT    Mucus PRESENT    Triple Phosphate Crystal PRESENT     Comment: Performed at [facility], [address] [place], [place] [address]  Culture, blood (Routine x 2)     Status: None (Preliminary result)   Collection Time: 05/27/22 11:52 AM   Specimen: BLOOD  Result Value Ref Range   Specimen Description BLOOD BLOOD RIGHT FOREARM    Special Requests      BOTTLES DRAWN AEROBIC AND ANAEROBIC Blood Culture adequate volume   Culture      NO GROWTH < 12 HOURS Performed at [facility], [address] [place], [place] [address]    Report Status PENDING   Sedimentation rate     Status: Abnormal   Collection Time: 05/27/22  1:02 PM  Result Value Ref Range   Sed Rate 82 (H) 0 - 16 mm/hr    Comment: Performed at [facility], [address], [place], [place] [address]  Comprehensive metabolic panel     Status: Abnormal   Collection Time: 05/27/22  1:32 PM  Result Value Ref Range   Sodium 132 (L) 135 - 145 mmol/L   Potassium 5.4 (H) 3.5 - 5.1 mmol/L   Chloride 99 98 - 111 mmol/L   CO2 25 22 - 32 mmol/L   Glucose, Bld 272 (H) 70 - 99 mg/dL    Comment: Glucose reference range applies only to samples taken after fasting for at least 8 hours.   BUN 69 (H) 8 - 23  mg/dL   Creatinine, Ser 6.96 (H) 0.61 - 1.24 mg/dL   Calcium 9.5 8.9 - 29.5 mg/dL   Total Protein 8.2 (H) 6.5 - 8.1 g/dL   Albumin 2.6 (L) 3.5 - 5.0 g/dL   AST 50 (H) 15 - 41 U/L   ALT 72 (H) 0 - 44 U/L   Alkaline Phosphatase 80 38 - 126 U/L   Total Bilirubin 0.5 0.3 - 1.2 mg/dL    GFR, Estimated 56 (L) >60 mL/min    Comment: (NOTE) Calculated using the CKD-EPI Creatinine Equation (2021)    Anion gap 8 5 - 15    Comment: Performed at [facility], [address] [place], [place] [address]  Lactic acid, plasma     Status: None   Collection Time: 05/27/22  1:32 PM  Result Value Ref Range   Lactic Acid, Venous 1.5 0.5 - 1.9 mmol/L    Comment: Performed at [facility], [address], [place], [place] [address]  CBC with Differential     Status: None   Collection Time: 05/27/22  1:32 PM  Result Value Ref Range   WBC 8.9 4.0 - 10.5 K/uL   RBC 4.99 4.22 - 5.81 MIL/uL   Hemoglobin 13.5 13.0 - 17.0 g/dL   HCT 02.7 25.3 - 66.4 %   MCV 88.2 80.0 - 100.0 fL   MCH 27.1 26.0 - 34.0 pg   MCHC 30.7 30.0 - 36.0 g/dL   RDW 40.3 47.4 - 25.9 %   Platelets 399 150 - 400 K/uL   nRBC 0.0 0.0 - 0.2 %   Neutrophils Relative % 86 %   Neutro Abs 7.6 1.7 - 7.7 K/uL   Lymphocytes Relative 10 %   Lymphs Abs 0.8 0.7 - 4.0 K/uL   Monocytes Relative 4 %   Monocytes Absolute 0.4 0.1 - 1.0 K/uL   Eosinophils Relative 0 %   Eosinophils Absolute 0.0 0.0 - 0.5 K/uL   Basophils Relative 0 %   Basophils Absolute 0.0 0.0 - 0.1 K/uL   Immature Granulocytes 0 %   Abs Immature Granulocytes 0.02 0.00 - 0.07 K/uL    Comment: Performed at [facility], [address] [place], [place] [address]  Protime-INR     Status: Abnormal   Collection Time: 05/27/22  1:32 PM  Result Value Ref Range   Prothrombin Time 16.8 (H) 11.4 - 15.2 seconds   INR 1.4 (H) 0.8 - 1.2    Comment: (NOTE) INR goal varies based on device and disease states. Performed at [facility], [address] [place], [place] [address]   C-reactive protein     Status: Abnormal   Collection Time: 05/27/22  1:32 PM  Result Value Ref Range   CRP 19.3 (H) <1.0 mg/dL    Comment: Performed at [facility], [address], [place], [place] [address]  Culture, blood (Routine x 2)     Status: None (Preliminary result)   Collection  Time: 05/27/22  1:33 PM   Specimen: BLOOD  Result Value Ref Range   Specimen Description BLOOD LEFT ANTECUBITAL    Special Requests      BOTTLES DRAWN AEROBIC AND ANAEROBIC Blood Culture adequate volume   Culture      NO GROWTH < 12 HOURS Performed at [facility], [address] [place], [place] [address]    Report Status PENDING   Lactic acid, plasma     Status: None   Collection Time: 05/27/22  3:43 PM  Result Value Ref Range  Lactic Acid, Venous 1.1 0.5 - 1.9 mmol/L    Comment: Performed at [facility], [address], [place], [place] [address]  Glucose, capillary     Status: Abnormal   Collection Time: 05/28/22 12:28 AM  Result Value Ref Range   Glucose-Capillary 165 (H) 70 - 99 mg/dL    Comment: Glucose reference range applies only to samples taken after fasting for at least 8 hours.  Glucose, capillary     Status: Abnormal   Collection Time: 05/28/22  4:44 AM  Result Value Ref Range   Glucose-Capillary 141 (H) 70 - 99 mg/dL    Comment: Glucose reference range applies only to samples taken after fasting for at least 8 hours.  HIV Antibody (routine testing w rflx)     Status: None   Collection Time: 05/28/22  5:56 AM  Result Value Ref Range   HIV Screen 4th Generation wRfx Non Reactive Non Reactive    Comment: Performed at [facility], [address], [place], [place] [address]  CBC     Status: Abnormal   Collection Time: 05/28/22  5:56 AM  Result Value Ref Range   WBC 10.8 (H) 4.0 - 10.5 K/uL   RBC 4.75 4.22 - 5.81 MIL/uL   Hemoglobin 13.1 13.0 - 17.0 g/dL   HCT 94.8 54.6 - 27.0 %   MCV 89.3 80.0 - 100.0 fL   MCH 27.6 26.0 - 34.0 pg   MCHC 30.9 30.0 - 36.0 g/dL   RDW 35.0 09.3 - 81.8 %   Platelets 336 150 - 400 K/uL   nRBC 0.0 0.0 - 0.2 %    Comment: Performed at [facility], [address] [place], [place] [address]  Comprehensive metabolic panel     Status: Abnormal   Collection Time: 05/28/22  5:56 AM  Result Value Ref Range   Sodium 137 135 - 145  mmol/L   Potassium 4.8 3.5 - 5.1 mmol/L   Chloride 106 98 - 111 mmol/L   CO2 25 22 - 32 mmol/L   Glucose, Bld 167 (H) 70 - 99 mg/dL    Comment: Glucose reference range applies only to samples taken after fasting for at least 8 hours.   BUN 55 (H) 8 - 23 mg/dL   Creatinine, Ser 1.69 0.61 - 1.24 mg/dL   Calcium 9.3 8.9 - 67.8 mg/dL   Total Protein 7.5 6.5 - 8.1 g/dL   Albumin 2.2 (L) 3.5 - 5.0 g/dL   AST 938 (H) 15 - 41 U/L   ALT 100 (H) 0 - 44 U/L   Alkaline Phosphatase 71 38 - 126 U/L   Total Bilirubin 0.6 0.3 - 1.2 mg/dL   GFR, Estimated >10 >17 mL/min    Comment: (NOTE) Calculated using the CKD-EPI Creatinine Equation (2021)    Anion gap 6 5 - 15    Comment: Performed at [facility], [address] [place], [place] [address]  Glucose, capillary     Status: Abnormal   Collection Time: 05/28/22  7:55 AM  Result Value Ref Range   Glucose-Capillary 139 (H) 70 - 99 mg/dL    Comment: Glucose reference range applies only to samples taken after fasting for at least 8 hours.  Glucose, capillary     Status: Abnormal   Collection Time: 05/28/22 11:52 AM  Result Value Ref Range   Glucose-Capillary 151 (H) 70 - 99 mg/dL    Comment: Glucose reference range applies only to samples taken after fasting for at least 8 hours.  Personally reviewed CT a/p- sacral wound with osteomyelitis, no abscess   CT HEAD WO CONTRAST (5MM)  Result Date: 05/28/2022 CLINICAL DATA:  Altered mental status EXAM: CT HEAD WITHOUT CONTRAST TECHNIQUE: Contiguous axial images were obtained from the base of the skull through the vertex without intravenous contrast. RADIATION DOSE REDUCTION: This exam was performed according to the departmental dose-optimization program which includes automated exposure control, adjustment of the mA and/or kV according to patient size and/or use of iterative reconstruction technique. COMPARISON:  Prior head CT 04/19/2022 FINDINGS: Brain: No evidence of acute infarction, hemorrhage,  hydrocephalus, extra-axial collection or mass lesion/mass effect. Stable mild atrophy. Vascular: No hyperdense vessel or unexpected calcification. Skull: Normal. Negative for fracture or focal lesion. Sinuses/Orbits: No acute finding. Other: Extensive punctate dermal calcifications. IMPRESSION: 1. No acute intracranial abnormality. Electronically Signed   By: Jacqulynn Cadet M.D.   On: 05/28/2022 08:42   CT Angio Chest PE W and/or Wo Contrast  Result Date: 05/27/2022 CLINICAL DATA:  Persistent tachycardia. EXAM: CT ANGIOGRAPHY CHEST WITH CONTRAST TECHNIQUE: Multidetector CT imaging of the chest was performed using the standard protocol during bolus administration of intravenous contrast. Multiplanar CT image reconstructions and MIPs were obtained to evaluate the vascular anatomy. RADIATION DOSE REDUCTION: This exam was performed according to the departmental dose-optimization program which includes automated exposure control, adjustment of the mA and/or kV according to patient size and/or use of iterative reconstruction technique. CONTRAST:  28mL OMNIPAQUE IOHEXOL 350 MG/ML SOLN COMPARISON:  Apr 19, 2022 FINDINGS: Cardiovascular: Enlarged heart. Heavy calcific atherosclerotic disease of the coronary arteries. Mediastinum/Nodes: No enlarged mediastinal, hilar, or axillary lymph nodes. Thyroid gland, trachea, and esophagus demonstrate no significant findings. Lungs/Pleura: Scarring versus atelectasis in bilateral lung bases. Biapical pleural thickening, left greater than right. Areas of ground-glass opacities in the left lung apex with associated mild architectural distortion. Upper Abdomen: No acute abnormality. Musculoskeletal: No chest wall abnormality. No acute or significant osseous findings. Review of the MIP images confirms the above findings. IMPRESSION: 1. No evidence of pulmonary embolism. 2. Enlarged heart with heavy calcific atherosclerotic disease of the coronary arteries. 3. Scarring versus  atelectasis in bilateral lung bases. 4. Areas of ground-glass opacities in the left lung apex with associated mild architectural distortion. This finding has stable since 2019, which supports scarring. 5. Biapical pleural thickening, left greater than right. Electronically Signed   By: Fidela Salisbury M.D.   On: 05/27/2022 17:28   CT ABDOMEN PELVIS W CONTRAST  Result Date: 05/27/2022 CLINICAL DATA:  Abdominal pain.  Sacral ulcer.  Fever. EXAM: CT ABDOMEN AND PELVIS WITH CONTRAST TECHNIQUE: Multidetector CT imaging of the abdomen and pelvis was performed using the standard protocol following bolus administration of intravenous contrast. RADIATION DOSE REDUCTION: This exam was performed according to the departmental dose-optimization program which includes automated exposure control, adjustment of the mA and/or kV according to patient size and/or use of iterative reconstruction technique. CONTRAST:  70mL OMNIPAQUE IOHEXOL 350 MG/ML SOLN COMPARISON:  CT abdomen and pelvis 04/13/2022 FINDINGS: Lower chest: There is some atelectasis in the lung bases. Hepatobiliary: No focal liver abnormality is seen. No gallstones, gallbladder wall thickening, or biliary dilatation. Pancreas: Unremarkable. No pancreatic ductal dilatation or surrounding inflammatory changes. Spleen: Normal in size without focal abnormality. Adrenals/Urinary Tract: Again seen is an extrarenal pelvis on the left. There is no hydronephrosis or perinephric fluid. The adrenal glands are within normal limits. The bladder is decompressed by Foley catheter. Stomach/Bowel: Rectal wall thickening with surrounding inflammation and presacral edema appears unchanged.  There is no evidence for bowel obstruction or free air. There is a large amount of stool throughout the colon. Appendix is within normal limits. Small bowel is within normal limits. Percutaneous gastrostomy tube in place, new from prior. Stomach is nondilated. Vascular/Lymphatic: There are severe  atherosclerotic calcifications of the aorta. Peripheral vascular calcifications are also seen. No evidence for aortic aneurysm. No enlarged lymph nodes are identified. Reproductive: Prostate is unremarkable. Other: No ascites or focal abdominal wall hernia. Musculoskeletal: Sacral decubitus ulcer is again seen overlying the distal sacrum and coccyx. Previously identified fluid collection is no longer seen. The ulcer abuts the tip of the coccyx and distal sacrum with some questionable mild underlying erosive change of the coccyx. IMPRESSION: 1. Sacral decubitus ulcer again seen extending to the level of the distal sacrum and coccyx. There are erosive changes of the coccyx compatible with osteomyelitis. 2. Rectal wall thickening with surrounding inflammation persists concerning for proctitis. 3. Foley catheter in place. 4. New gastrostomy tube present. 5. Severe atherosclerotic disease. Aortic Atherosclerosis (ICD10-I70.0). Electronically Signed   By: Ronney Asters M.D.   On: 05/27/2022 17:25   DG Chest Port 1 View  Result Date: 05/27/2022 CLINICAL DATA:  Provided history: Fever, sepsis protocol. EXAM: PORTABLE CHEST 1 VIEW COMPARISON:  Prior chest radiographs 05/19/2022 and earlier. FINDINGS: Shallow inspiration radiograph. Heart size within normal limits. Aortic atherosclerosis. No appreciable airspace consolidation or pulmonary edema. No evidence of pleural effusion or pneumothorax. No acute bony abnormality identified. Degenerative changes of the spine and left glenohumeral joint. IMPRESSION: Shallow inspiration radiograph. No evidence of acute cardiopulmonary abnormality. Aortic Atherosclerosis (ICD10-I70.0). Electronically Signed   By: Kellie Simmering D.O.   On: 05/27/2022 12:17     Assessment & Plan:  Albaraa Buntain Fleury is a 71 y.o. male with a sacral wound stage IV and bone exposed, osteo on the CT which is expected, some superficial necrotic tissue but no collections. Having fevers and admitted with  sepsis. I do not think the fevers were related to this wound necessarily. He has urine cultures pending.  - Wound repacked. Will wait until tomorrow to do some superficial debridement and possible biopsy at the bedside pending how the patient does with the wound  -Discussed with Dr. Manuella Ghazi and notified the patient, will be after 11 tomorrow   All questions were answered to the satisfaction of the patient.   Virl Cagey 05/28/2022, 12:30 PM

## 2022-05-28 NOTE — Progress Notes (Signed)
No known settings for CPAP from sleep study. Patient says he use to wear CPAP but has not worn in a while. States he will try. He is very slow with answers. Will Try CPAP of 10.

## 2022-05-28 NOTE — Care Management Important Message (Signed)
Important Message  Patient Details  Name: Christian [name] MRN: [id number] Date of Birth: [birth year]   Medicare Important Message Given:  Yes     [name] 05/28/2022, 2:52 PM

## 2022-05-28 NOTE — Consult Note (Signed)
    [facility]    Date of Admission:  05/27/2022     Reason for Consult: Sacral wound, OM     Referring Physician: Dr Sherryll Burger  Current antibiotics: Cefepime Vanco Flagyl  ASSESSMENT:    71 y.o. male admitted with:  GPC bacteremia: Admission blood cultures are positive at this time.  BCID is pending.  Sacral decubitus ulcer and osteomyelitis: Patient presenting with sepsis on admission and CT scan showing osteomyelitis that was new from CT scan on 04/13/22.  He also has a chronic Foley that has raised concern for complicated UTI.  Wound care has seen patient this admission.  Surgery has also seen him earlier today and notes some superficial necrotic tissue.  They do not think fevers/sepsis is related to the wounds per se. They did note that he has soiled himself many times and the wound is close to his anus however.  They plan for possible superficial debridement and biopsy tomorrow at the bedside.  He also has recent admission 5/2-6/22 for strep constellatus bacteremia due to sacral decub with abscess where he completed a course of antibiotics and hydrotherapy.  TEE was negative at that time.  During that admission had urinary retention at which time Foley was placed.   Type 2 DM  RECOMMENDATIONS:    Continue current antibiotics Await BCID for positive blood culture Repeat blood cultures in the morning Appears UA was collected from old Foley so probably unreliable.  Foley changed today per RN.   Suspect source of bacteremia likely is the wound.  Identification of GPC in blood culture will help sort this out  TTE Of note, his strep from May was fairly resistant so would continue vancomycin pending susceptibilities if it were to grow again Wound care Lab monitoring Glycemic control Available as needed over the weekend.  Dr Elinor Parkinson will follow up Monday.   Vedia Coffer for Infectious Disease [facility] [phone]  pager 05/28/2022, 5:31 PM  I spent > 5 min reviewing chart, discussing with TRH, and formulating plan.

## 2022-05-28 NOTE — Consult Note (Addendum)
WOC Nurse Consult Note: Reason for Consult:Chronic Stage 4 nonhealing pressure injury. Known to our department from previous admissions. Last seen 2 weeks ago by my associate, S. Salvadore Farber.Photo documentation provided to the EHR on Admission by Provider. Wound type:Pressure Pressure Injury POA: Yes Measurement:Bedside RN to measure and document measurements on Nursing Flow Sheet today with next dressing change Wound bed: Red (30%) with central area of black nonviable tissue (70%) Drainage (amount, consistency, odor) Small to moderate serous to serosanguinous Periwound: with evidence of previous wound healing (scarring, contraction) Dressing procedure/placement/frequency: Due to the proximity of the wound to the anus, twice daily saline dressings are recommended over other methods of wound management i.e., negative pressure wound therapy. Guidance is provided for Nursing regarding the frequency and technique so that the deeper depths of the wound that are pointing toward the left buttock and anus are filled with the saline moistened Kerlix roll gauze.Turing and repositioning is in place and as patient is in the ICU, he is on a mattress replacement with low air loss. This will be continued when transferred to floor. Heels are to be floated as a DTPI was present 2 weeks ago.Time in the supine position is to be minimized.Bilateral Prevalon Boots are provided.  WOC nursing team will not follow, but will remain available to this patient, the nursing and medical teams.  Please re-consult if needed.  Thank you for inviting [place] to participate in this patient's Plan of Care.  Ladona Mow, MSN, RN, CNS, GNP, Leda Min, [employer], [employer] phone:  [phone]

## 2022-05-28 NOTE — Progress Notes (Signed)
PROGRESS NOTE    Christian [name]  [id number] DOB: 1951-02-19 DOA: 05/27/2022 PCP: Donald Prose, MD   Brief Narrative:    Christian [name] is a 71 y.o. male with medical history significant for diabetes mellitus, CVA, bipolar disorder, coronary artery disease, PE and DVT, CKD 3, atrial fibrillation, OSA, gastrostomy tube and chronic Foley catheter. Patient reports to the ED from nursing home reports of altered mental status, increasing lethargy and fever, with tachycardia heart rate in the 130s.  Patient was admitted with concerns of severe sepsis in the setting of sacral decubitus ulcer which is now demonstrating findings of new osteomyelitis.  Assessment & Plan:   Principal Problem:   Severe sepsis ([place]) Active Problems:   Urinary tract infection associated with catheterization of urinary tract (HCC)   Encephalopathy   Sacral decubitus ulcer, stage IV (HCC)   Chronic diastolic CHF (congestive heart failure) ([place])   Hypertension   Type 2 diabetes mellitus with complication, without long-term current use of insulin (HCC)   OSA on CPAP   Diabetes mellitus type 2 in nonobese ([place])   Pulmonary embolism (HCC)  Assessment and Plan:  Severe sepsis ([place]) Meeting sepsis criteria with fever of 103, tachycardia heart rates up to 140s, tachypnea to 36.  With evidence of endorgan dysfunction, and acute metabolic encephalopathy and possibly AKI.  WBC 8.9.  Source of infection likely sacral decubitus ulcer which is showing new osteomyelitis not present from prior imaging, and possible catheter associated UTI.  No suggestion of infection in the chest or abdomen. -Broad-spectrum antibiotics IV vancomycin, cefepime and metronidazole -Follow-up blood cultures  -Add on urine cultures  -3 L bolus given, continue LR 100cc/hr x 1 day    Sacral decubitus ulcer, stage IV (HCC) Large stage IV sacral decubitus ulcer.  Patient nonambulatory.  Poor functional status and debility after 2 prolonged recent  hospitalizations in April and then May to June.  CT suggesting osteomyelitis of the coccyx.  No abscess. -Considering baseline poor functional status, deferred admission to [facility] for now -Will consult general surgery if debridement is indicated or needed, and if culture sample can be obtained -Broad-spectrum antibiotics -Palliative care consult - May need Tele- ID guidance with antibiotics   Encephalopathy Acute metabolic encephalopathy likely secondary to severe sepsis. -CT head with no acute findings -Hold psychoactive medications-Abilify, benztropine narcotics for now     Urinary tract infection associated with catheterization of urinary tract ([place]) Catheter associated UTI.  Foley placed during recent hospitalization due to urine acute urinary retention, was to follow-up with urology as outpatient.  Presented with severe sepsis. -Broad-spectrum antibiotics -Follow-up urine cultures-add-on   Pulmonary embolism (HCC) Hold Xarelto for now while NPO -S/c Lovenox for DVT prophylaxis, may need to change to therapeutic dose, if mental status does not improve   Type 2 diabetes mellitus with complication, without long-term current use of insulin (HCC) Controlled, A1c 7.6.  Glucose 272.   -Hold home long-acting insulin 15 units twice daily - SSI- S     Hypertension Stable. -Hold metoprolol 50 mg daily while n.p.o. -As needed labetalol for systolic greater than [id number]    DVT prophylaxis: Lovenox Code Status: Full Family Communication: None at bedside Disposition Plan:  Status is: Inpatient Remains inpatient appropriate because: IV medications   Skin Assessment:  I have examined the patient's skin and I agree with the wound assessment as performed by the wound care RN as outlined below:  Pressure Injury 03/31/22 Sacrum Mid Stage 4 - Full thickness tissue  loss with exposed bone, tendon or muscle. (Active)  03/31/22 1933  Location: Sacrum  Location Orientation: Mid  Staging:  Stage 4 - Full thickness tissue loss with exposed bone, tendon or muscle.  Wound Description (Comments):   Present on Admission: Yes  Dressing Type Gauze (Comment) 05/28/22 0000     Pressure Injury 05/08/22 Heel Right Deep Tissue Pressure Injury - Purple or maroon localized area of discolored intact skin or blood-filled blister due to damage of underlying soft tissue from pressure and/or shear. (Active)  05/08/22 1600  Location: Heel  Location Orientation: Right  Staging: Deep Tissue Pressure Injury - Purple or maroon localized area of discolored intact skin or blood-filled blister due to damage of underlying soft tissue from pressure and/or shear.  Wound Description (Comments):   Present on Admission: No  Dressing Type None 05/28/22 0000    Consultants:  Gen Surg Palliative care ID  Procedures:  None  Antimicrobials:  Anti-infectives (From admission, onward)    Start     Dose/Rate Route Frequency Ordered Stop   05/28/22 1800  vancomycin (VANCOREADY) IVPB 1750 mg/350 mL        1,750 mg 175 mL/hr over 120 Minutes Intravenous Every 24 hours 05/27/22 1753     05/27/22 2345  metroNIDAZOLE (FLAGYL) IVPB 500 mg        500 mg 100 mL/hr over 60 Minutes Intravenous Every 12 hours 05/27/22 2337     05/27/22 2200  ceFEPIme (MAXIPIME) 2 g in sodium chloride 0.9 % 100 mL IVPB        2 g 200 mL/hr over 30 Minutes Intravenous Every 8 hours 05/27/22 1750     05/27/22 1800  vancomycin (VANCOREADY) IVPB 2000 mg/400 mL        2,000 mg 200 mL/hr over 120 Minutes Intravenous  Once 05/27/22 1750 05/27/22 1930   05/27/22 1700  cefTRIAXone (ROCEPHIN) 1 g in sodium chloride 0.9 % 100 mL IVPB        1 g 200 mL/hr over 30 Minutes Intravenous  Once 05/27/22 1651 05/27/22 1812      Subjective: Patient seen and evaluated today with no new acute complaints or concerns. No acute concerns or events noted overnight.  Objective: Vitals:   05/28/22 0800 05/28/22 0811 05/28/22 0900 05/28/22 1000  BP:  103/76  115/81 108/76  Pulse: (!) 109  (!) 109 (!) 108  Resp: 18  16 15   Temp:  97.6 F (36.4 C)    TempSrc:  Axillary    SpO2: 100%  100% 100%  Weight:      Height:        Intake/Output Summary (Last 24 hours) at 05/28/2022 1102 Last data filed at 05/28/2022 0650 Gross per 24 hour  Intake 2934.46 ml  Output 1800 ml  Net 1134.46 ml   Filed Weights   05/27/22 1139 05/27/22 2300  Weight: 136 kg 95.7 kg    Examination:  General exam: Appears calm and comfortable  Respiratory system: Clear to auscultation. Respiratory effort normal. Cardiovascular system: S1 & S2 heard, RRR.  Gastrointestinal system: Abdomen is soft Central nervous system: Alert and awake Extremities: No edema Skin: Sacral decubitus ulcer stage IV as documented Psychiatry: Flat affect.    Data Reviewed: I have personally reviewed following labs and imaging studies  CBC: Recent Labs  Lab 05/27/22 1332 05/28/22 0556  WBC 8.9 10.8*  NEUTROABS 7.6  --   HGB 13.5 13.1  HCT 44.0 42.4  MCV 88.2 89.3  PLT 399 336  Basic Metabolic Panel: Recent Labs  Lab 05/27/22 1332 05/28/22 0556  NA 132* 137  K 5.4* 4.8  CL 99 106  CO2 25 25  GLUCOSE 272* 167*  BUN 69* 55*  CREATININE 1.38* 1.17  CALCIUM 9.5 9.3   GFR: Estimated Creatinine Clearance: 72.5 mL/min (by C-G formula based on SCr of 1.17 mg/dL). Liver Function Tests: Recent Labs  Lab 05/27/22 1332 05/28/22 0556  AST 50* 109*  ALT 72* 100*  ALKPHOS 80 71  BILITOT 0.5 0.6  PROT 8.2* 7.5  ALBUMIN 2.6* 2.2*   No results for input(s): "LIPASE", "AMYLASE" in the last 168 hours. No results for input(s): "AMMONIA" in the last 168 hours. Coagulation Profile: Recent Labs  Lab 05/27/22 1332  INR 1.4*   Cardiac Enzymes: No results for input(s): "CKTOTAL", "CKMB", "CKMBINDEX", "TROPONINI" in the last 168 hours. BNP (last 3 results) No results for input(s): "PROBNP" in the last 8760 hours. HbA1C: No results for input(s): "HGBA1C" in the  last 72 hours. CBG: Recent Labs  Lab 05/28/22 0028 05/28/22 0444 05/28/22 0755  GLUCAP 165* 141* 139*   Lipid Profile: No results for input(s): "CHOL", "HDL", "LDLCALC", "TRIG", "CHOLHDL", "LDLDIRECT" in the last 72 hours. Thyroid Function Tests: No results for input(s): "TSH", "T4TOTAL", "FREET4", "T3FREE", "THYROIDAB" in the last 72 hours. Anemia Panel: No results for input(s): "VITAMINB12", "FOLATE", "FERRITIN", "TIBC", "IRON", "RETICCTPCT" in the last 72 hours. Sepsis Labs: Recent Labs  Lab 05/27/22 1332 05/27/22 1543  LATICACIDVEN 1.5 1.1    Recent Results (from the past 240 hour(s))  Culture, blood (Routine x 2)     Status: None (Preliminary result)   Collection Time: 05/27/22 11:52 AM   Specimen: BLOOD  Result Value Ref Range Status   Specimen Description BLOOD BLOOD RIGHT FOREARM  Final   Special Requests   Final    BOTTLES DRAWN AEROBIC AND ANAEROBIC Blood Culture adequate volume   Culture   Final    NO GROWTH < 12 HOURS Performed at [facility], [address], [place], [place] [address]    Report Status PENDING  Incomplete  Culture, blood (Routine x 2)     Status: None (Preliminary result)   Collection Time: 05/27/22  1:33 PM   Specimen: BLOOD  Result Value Ref Range Status   Specimen Description BLOOD LEFT ANTECUBITAL  Final   Special Requests   Final    BOTTLES DRAWN AEROBIC AND ANAEROBIC Blood Culture adequate volume   Culture   Final    NO GROWTH < 12 HOURS Performed at [facility], [address] [place], [place] [address]    Report Status PENDING  Incomplete         Radiology Studies: CT HEAD WO CONTRAST (5MM)  Result Date: 05/28/2022 CLINICAL DATA:  Altered mental status EXAM: CT HEAD WITHOUT CONTRAST TECHNIQUE: Contiguous axial images were obtained from the base of the skull through the vertex without intravenous contrast. RADIATION DOSE REDUCTION: This exam was performed according to the departmental dose-optimization program which  includes automated exposure control, adjustment of the mA and/or kV according to patient size and/or use of iterative reconstruction technique. COMPARISON:  Prior head CT 04/19/2022 FINDINGS: Brain: No evidence of acute infarction, hemorrhage, hydrocephalus, extra-axial collection or mass lesion/mass effect. Stable mild atrophy. Vascular: No hyperdense vessel or unexpected calcification. Skull: Normal. Negative for fracture or focal lesion. Sinuses/Orbits: No acute finding. Other: Extensive punctate dermal calcifications. IMPRESSION: 1. No acute intracranial abnormality. Electronically Signed   By: Jacqulynn Cadet M.D.   On:  05/28/2022 08:42   CT Angio Chest PE W and/or Wo Contrast  Result Date: 05/27/2022 CLINICAL DATA:  Persistent tachycardia. EXAM: CT ANGIOGRAPHY CHEST WITH CONTRAST TECHNIQUE: Multidetector CT imaging of the chest was performed using the standard protocol during bolus administration of intravenous contrast. Multiplanar CT image reconstructions and MIPs were obtained to evaluate the vascular anatomy. RADIATION DOSE REDUCTION: This exam was performed according to the departmental dose-optimization program which includes automated exposure control, adjustment of the mA and/or kV according to patient size and/or use of iterative reconstruction technique. CONTRAST:  4mL OMNIPAQUE IOHEXOL 350 MG/ML SOLN COMPARISON:  Apr 19, 2022 FINDINGS: Cardiovascular: Enlarged heart. Heavy calcific atherosclerotic disease of the coronary arteries. Mediastinum/Nodes: No enlarged mediastinal, hilar, or axillary lymph nodes. Thyroid gland, trachea, and esophagus demonstrate no significant findings. Lungs/Pleura: Scarring versus atelectasis in bilateral lung bases. Biapical pleural thickening, left greater than right. Areas of ground-glass opacities in the left lung apex with associated mild architectural distortion. Upper Abdomen: No acute abnormality. Musculoskeletal: No chest wall abnormality. No acute or  significant osseous findings. Review of the MIP images confirms the above findings. IMPRESSION: 1. No evidence of pulmonary embolism. 2. Enlarged heart with heavy calcific atherosclerotic disease of the coronary arteries. 3. Scarring versus atelectasis in bilateral lung bases. 4. Areas of ground-glass opacities in the left lung apex with associated mild architectural distortion. This finding has stable since 2019, which supports scarring. 5. Biapical pleural thickening, left greater than right. Electronically Signed   By: Fidela Salisbury M.D.   On: 05/27/2022 17:28   CT ABDOMEN PELVIS W CONTRAST  Result Date: 05/27/2022 CLINICAL DATA:  Abdominal pain.  Sacral ulcer.  Fever. EXAM: CT ABDOMEN AND PELVIS WITH CONTRAST TECHNIQUE: Multidetector CT imaging of the abdomen and pelvis was performed using the standard protocol following bolus administration of intravenous contrast. RADIATION DOSE REDUCTION: This exam was performed according to the departmental dose-optimization program which includes automated exposure control, adjustment of the mA and/or kV according to patient size and/or use of iterative reconstruction technique. CONTRAST:  2mL OMNIPAQUE IOHEXOL 350 MG/ML SOLN COMPARISON:  CT abdomen and pelvis 04/13/2022 FINDINGS: Lower chest: There is some atelectasis in the lung bases. Hepatobiliary: No focal liver abnormality is seen. No gallstones, gallbladder wall thickening, or biliary dilatation. Pancreas: Unremarkable. No pancreatic ductal dilatation or surrounding inflammatory changes. Spleen: Normal in size without focal abnormality. Adrenals/Urinary Tract: Again seen is an extrarenal pelvis on the left. There is no hydronephrosis or perinephric fluid. The adrenal glands are within normal limits. The bladder is decompressed by Foley catheter. Stomach/Bowel: Rectal wall thickening with surrounding inflammation and presacral edema appears unchanged. There is no evidence for bowel obstruction or free air.  There is a large amount of stool throughout the colon. Appendix is within normal limits. Small bowel is within normal limits. Percutaneous gastrostomy tube in place, new from prior. Stomach is nondilated. Vascular/Lymphatic: There are severe atherosclerotic calcifications of the aorta. Peripheral vascular calcifications are also seen. No evidence for aortic aneurysm. No enlarged lymph nodes are identified. Reproductive: Prostate is unremarkable. Other: No ascites or focal abdominal wall hernia. Musculoskeletal: Sacral decubitus ulcer is again seen overlying the distal sacrum and coccyx. Previously identified fluid collection is no longer seen. The ulcer abuts the tip of the coccyx and distal sacrum with some questionable mild underlying erosive change of the coccyx. IMPRESSION: 1. Sacral decubitus ulcer again seen extending to the level of the distal sacrum and coccyx. There are erosive changes of the coccyx compatible with osteomyelitis. 2.  Rectal wall thickening with surrounding inflammation persists concerning for proctitis. 3. Foley catheter in place. 4. New gastrostomy tube present. 5. Severe atherosclerotic disease. Aortic Atherosclerosis (ICD10-I70.0). Electronically Signed   By: Darliss Cheney M.D.   On: 05/27/2022 17:25   DG Chest Port 1 View  Result Date: 05/27/2022 CLINICAL DATA:  Provided history: Fever, sepsis protocol. EXAM: PORTABLE CHEST 1 VIEW COMPARISON:  Prior chest radiographs 05/19/2022 and earlier. FINDINGS: Shallow inspiration radiograph. Heart size within normal limits. Aortic atherosclerosis. No appreciable airspace consolidation or pulmonary edema. No evidence of pleural effusion or pneumothorax. No acute bony abnormality identified. Degenerative changes of the spine and left glenohumeral joint. IMPRESSION: Shallow inspiration radiograph. No evidence of acute cardiopulmonary abnormality. Aortic Atherosclerosis (ICD10-I70.0). Electronically Signed   By: Jackey Loge D.O.   On: 05/27/2022  12:17        Scheduled Meds:  Chlorhexidine Gluconate Cloth  6 each Topical Q0600   enoxaparin (LOVENOX) injection  60 mg Subcutaneous QHS   insulin aspart  0-9 Units Subcutaneous Q4H   Continuous Infusions:  sodium chloride 100 mL/hr at 05/28/22 0228   ceFEPime (MAXIPIME) IV 200 mL/hr at 05/28/22 0650   metronidazole 500 mg (05/28/22 0917)   vancomycin       LOS: 1 day    Time spent: 35 minutes    Jasiyah Poland Hoover Brunette, DO [facility]  If 7PM-7AM, please contact night-coverage [email or web address] 05/28/2022, 11:02 AM

## 2022-05-29 ENCOUNTER — Other Ambulatory Visit (HOSPITAL_COMMUNITY): Payer: Self-pay | Admitting: *Deleted

## 2022-05-29 ENCOUNTER — Inpatient Hospital Stay (HOSPITAL_COMMUNITY): Payer: Medicare HMO

## 2022-05-29 DIAGNOSIS — M869 Osteomyelitis, unspecified: Secondary | ICD-10-CM | POA: Diagnosis not present

## 2022-05-29 DIAGNOSIS — R7881 Bacteremia: Secondary | ICD-10-CM

## 2022-05-29 DIAGNOSIS — L89154 Pressure ulcer of sacral region, stage 4: Secondary | ICD-10-CM | POA: Diagnosis not present

## 2022-05-29 LAB — CBC
HCT: 35.9 % — ABNORMAL LOW (ref 39.0–52.0)
Hemoglobin: 11 g/dL — ABNORMAL LOW (ref 13.0–17.0)
MCH: 27.6 pg (ref 26.0–34.0)
MCHC: 30.6 g/dL (ref 30.0–36.0)
MCV: 90 fL (ref 80.0–100.0)
Platelets: 339 10*3/uL (ref 150–400)
RBC: 3.99 MIL/uL — ABNORMAL LOW (ref 4.22–5.81)
RDW: 15.3 % (ref 11.5–15.5)
WBC: 5.3 10*3/uL (ref 4.0–10.5)
nRBC: 0 % (ref 0.0–0.2)

## 2022-05-29 LAB — BLOOD CULTURE ID PANEL (REFLEXED) - BCID2

## 2022-05-29 LAB — COMPREHENSIVE METABOLIC PANEL
ALT: 74 U/L — ABNORMAL HIGH (ref 0–44)
AST: 54 U/L — ABNORMAL HIGH (ref 15–41)
Albumin: 2 g/dL — ABNORMAL LOW (ref 3.5–5.0)
Alkaline Phosphatase: 63 U/L (ref 38–126)
Anion gap: 5 (ref 5–15)
BUN: 48 mg/dL — ABNORMAL HIGH (ref 8–23)
CO2: 20 mmol/L — ABNORMAL LOW (ref 22–32)
Calcium: 9 mg/dL (ref 8.9–10.3)
Chloride: 113 mmol/L — ABNORMAL HIGH (ref 98–111)
Creatinine, Ser: 0.94 mg/dL (ref 0.61–1.24)
GFR, Estimated: >60 mL/min (ref >60)
Glucose, Bld: 249 mg/dL — ABNORMAL HIGH (ref 70–99)
Potassium: 4.1 mmol/L (ref 3.5–5.1)
Sodium: 138 mmol/L (ref 135–145)
Total Bilirubin: 0.5 mg/dL (ref 0.3–1.2)
Total Protein: 6.8 g/dL (ref 6.5–8.1)

## 2022-05-29 LAB — GLUCOSE, CAPILLARY
Glucose-Capillary: 229 mg/dL — ABNORMAL HIGH (ref 70–99)
Glucose-Capillary: 232 mg/dL — ABNORMAL HIGH (ref 70–99)
Glucose-Capillary: 233 mg/dL — ABNORMAL HIGH (ref 70–99)
Glucose-Capillary: 266 mg/dL — ABNORMAL HIGH (ref 70–99)
Glucose-Capillary: 271 mg/dL — ABNORMAL HIGH (ref 70–99)
Glucose-Capillary: 283 mg/dL — ABNORMAL HIGH (ref 70–99)

## 2022-05-29 LAB — MAGNESIUM: Magnesium: 2.2 mg/dL (ref 1.7–2.4)

## 2022-05-29 LAB — URINE CULTURE: Culture: NO GROWTH

## 2022-05-29 LAB — ECHOCARDIOGRAM LIMITED
Height: 72 in
S' Lateral: 4.1 cm
Weight: 3590.85 oz

## 2022-05-29 MED ORDER — ACETAMINOPHEN 325 MG PO TABS
650.0000 mg | ORAL_TABLET | Freq: Four times a day (QID) | ORAL | Status: DC | PRN
  Administered 2022-05-31 – 2022-06-03 (×3): 650 mg
  Filled 2022-05-29 (×3): qty 2

## 2022-05-29 MED ORDER — ONDANSETRON HCL 4 MG/2ML IJ SOLN: 4.0000 mg | Freq: Four times a day (QID) | INTRAMUSCULAR | Status: DC | PRN

## 2022-05-29 MED ORDER — MORPHINE SULFATE (PF) 4 MG/ML IV SOLN
INTRAVENOUS | Status: AC
  Administered 2022-05-29: 4 mg via INTRAVENOUS
  Filled 2022-05-29: qty 1

## 2022-05-29 MED ORDER — PERFLUTREN LIPID MICROSPHERE
1.0000 mL | INTRAVENOUS | Status: AC | PRN
  Administered 2022-05-29: 4 mL via INTRAVENOUS

## 2022-05-29 MED ORDER — MORPHINE SULFATE (PF) 4 MG/ML IV SOLN: 4.0000 mg | INTRAVENOUS | Status: AC

## 2022-05-29 MED ORDER — ACETAMINOPHEN 650 MG RE SUPP: 650.0000 mg | Freq: Four times a day (QID) | RECTAL | Status: DC | PRN

## 2022-05-29 MED ORDER — ONDANSETRON HCL 4 MG PO TABS: 4.0000 mg | ORAL_TABLET | Freq: Four times a day (QID) | ORAL | Status: DC | PRN

## 2022-05-29 NOTE — Progress Notes (Signed)
Patient on BIPAP auto 10/5 med mask still wearing and asleep.

## 2022-05-29 NOTE — Progress Notes (Signed)
PROGRESS NOTE    Christian [name]  VHQ:[id number] DOB: 09-01-1951 DOA: 05/27/2022 PCP: [name], MD   Brief Narrative:    Christian [name] is a 71 y.o. male with medical history significant for diabetes mellitus, CVA, bipolar disorder, coronary artery disease, PE and DVT, CKD 3, atrial fibrillation, OSA, gastrostomy tube and chronic Foley catheter. Patient reports to the ED from nursing home reports of altered mental status, increasing lethargy and fever, with tachycardia heart rate in the 130s.  Patient was admitted with concerns of severe sepsis in the setting of sacral decubitus ulcer which is now demonstrating findings of new osteomyelitis.  He is now noted to have strep bacteremia and ID is following.  Assessment & Plan:   Principal Problem:   Severe sepsis (HCC) Active Problems:   Urinary tract infection associated with catheterization of urinary tract (HCC)   Encephalopathy   Sacral decubitus ulcer, stage IV (HCC)   Chronic diastolic CHF (congestive heart failure) (HCC)   Hypertension   Type 2 diabetes mellitus with complication, without long-term current use of insulin (HCC)   OSA on CPAP   Diabetes mellitus type 2 in nonobese (HCC)   Pulmonary embolism (HCC)  Assessment and Plan:   Severe sepsis with strep bacteremia (HCC) Meeting sepsis criteria with fever of 103, tachycardia heart rates up to 140s, tachypnea to 36.  With evidence of endorgan dysfunction, and acute metabolic encephalopathy and possibly AKI.  WBC 8.9.  Source of infection likely sacral decubitus ulcer which is showing new osteomyelitis not present from prior imaging, and possible catheter associated UTI.  No suggestion of infection in the chest or abdomen. -Broad-spectrum antibiotics IV vancomycin, cefepime and metronidazole -Follow-up sensitivities on blood culture -Urine cultures with no growth - appreciate ongoing ID recommendations -General surgery planning for debridement and possible bone biopsy  today   Sacral decubitus ulcer, stage IV (HCC) Large stage IV sacral decubitus ulcer.  Patient nonambulatory.  Poor functional status and debility after 2 prolonged recent hospitalizations in April and then May to June.  CT suggesting osteomyelitis of the coccyx.  No abscess. -Appreciate debridement and possible bone biopsy with general surgery 7/1   Encephalopathy-resolved Acute metabolic encephalopathy likely secondary to severe sepsis. -CT head with no acute findings -Hold psychoactive medications-Abilify, benztropine narcotics for now     Urinary tract infection associated with catheterization of urinary tract (HCC) -Although this was suspected on admission, seems less likely with no growth in urine culture -UA also collected from old Foley, which has not been exchanged   Pulmonary embolism (HCC) Hold Xarelto for now while NPO -S/c Lovenox for DVT prophylaxis, may need to change to therapeutic dose, if mental status does not improve -Plan to resume after biopsy   Type 2 diabetes mellitus with complication, without long-term current use of insulin (HCC) Controlled, A1c 7.6.  Glucose 272.   -Hold home long-acting insulin 15 units twice daily - SSI- S     Hypertension Stable. -Hold metoprolol 50 mg daily while n.p.o. -As needed labetalol for systolic greater than 170     DVT prophylaxis: Lovenox Code Status: Full Family Communication: None at bedside Disposition Plan:  Status is: Inpatient Remains inpatient appropriate because: IV medications     Skin Assessment:   I have examined the patient's skin and I agree with the wound assessment as performed by the wound care RN as outlined below:       Pressure Injury 03/31/22 Sacrum Mid Stage 4 - Full thickness tissue loss with  exposed bone, tendon or muscle. (Active)  03/31/22 1933  Location: Sacrum  Location Orientation: Mid  Staging: Stage 4 - Full thickness tissue loss with exposed bone, tendon or muscle.  Wound  Description (Comments):   Present on Admission: Yes  Dressing Type Gauze (Comment) 05/28/22 0000     Pressure Injury 05/08/22 Heel Right Deep Tissue Pressure Injury - Purple or maroon localized area of discolored intact skin or blood-filled blister due to damage of underlying soft tissue from pressure and/or shear. (Active)  05/08/22 1600  Location: Heel  Location Orientation: Right  Staging: Deep Tissue Pressure Injury - Purple or maroon localized area of discolored intact skin or blood-filled blister due to damage of underlying soft tissue from pressure and/or shear.  Wound Description (Comments):   Present on Admission: No  Dressing Type None 05/28/22 0000      Consultants:  Gen Surg Palliative care ID   Procedures:  None   Antimicrobials:  Anti-infectives (From admission, onward)    Start     Dose/Rate Route Frequency Ordered Stop   05/28/22 1800  vancomycin (VANCOREADY) IVPB 1750 mg/350 mL        1,750 mg 175 mL/hr over 120 Minutes Intravenous Every 24 hours 05/27/22 1753     05/27/22 2345  metroNIDAZOLE (FLAGYL) IVPB 500 mg        500 mg 100 mL/hr over 60 Minutes Intravenous Every 12 hours 05/27/22 2337     05/27/22 2200  ceFEPIme (MAXIPIME) 2 g in sodium chloride 0.9 % 100 mL IVPB        2 g 200 mL/hr over 30 Minutes Intravenous Every 8 hours 05/27/22 1750     05/27/22 1800  vancomycin (VANCOREADY) IVPB 2000 mg/400 mL        2,000 mg 200 mL/hr over 120 Minutes Intravenous  Once 05/27/22 1750 05/27/22 1930   05/27/22 1700  cefTRIAXone (ROCEPHIN) 1 g in sodium chloride 0.9 % 100 mL IVPB        1 g 200 mL/hr over 30 Minutes Intravenous  Once 05/27/22 1651 05/27/22 1812       Subjective: Patient seen and evaluated today with no new acute complaints or concerns. No acute concerns or events noted overnight.  Objective: Vitals:   05/29/22 0600 05/29/22 0654 05/29/22 1000 05/29/22 1100  BP: (!) 87/64 100/65    Pulse: (!) 108 (!) 111    Resp: 19     Temp: 98.9 F  (37.2 C)   98.7 F (37.1 C)  TempSrc:      SpO2: 98%     Weight:   101.8 kg   Height:        Intake/Output Summary (Last 24 hours) at 05/29/2022 1122 Last data filed at 05/29/2022 1100 Gross per 24 hour  Intake 769.17 ml  Output 1576 ml  Net -806.83 ml   Filed Weights   05/27/22 1139 05/27/22 2300 05/29/22 1000  Weight: 136 kg 95.7 kg 101.8 kg    Examination:  General exam: Appears calm and comfortable  Respiratory system: Clear to auscultation. Respiratory effort normal. Cardiovascular system: S1 & S2 heard, RRR.  Gastrointestinal system: Abdomen is soft Central nervous system: Alert and awake Extremities: No edema Skin: No significant lesions noted, sacral findings as noted previously Psychiatry: Flat affect.    Data Reviewed: I have personally reviewed following labs and imaging studies  CBC: Recent Labs  Lab 05/27/22 1332 05/28/22 0556 05/29/22 0551  WBC 8.9 10.8* 5.3  NEUTROABS 7.6  --   --  HGB 13.5 13.1 11.0*  HCT 44.0 42.4 35.9*  MCV 88.2 89.3 90.0  PLT 399 336 [id number]   Basic Metabolic Panel: Recent Labs  Lab 05/27/22 1332 05/28/22 0556 05/29/22 0551  NA 132* 137 138  K 5.4* 4.8 4.1  CL 99 106 113*  CO2 25 25 20*  GLUCOSE 272* 167* 249*  BUN 69* 55* 48*  CREATININE 1.38* 1.17 0.94  CALCIUM 9.5 9.3 9.0  MG  --   --  2.2   GFR: Estimated Creatinine Clearance: 92.9 mL/min (by C-G formula based on SCr of 0.94 mg/dL). Liver Function Tests: Recent Labs  Lab 05/27/22 1332 05/28/22 0556 05/29/22 0551  AST 50* 109* 54*  ALT 72* 100* 74*  ALKPHOS 80 71 63  BILITOT 0.5 0.6 0.5  PROT 8.2* 7.5 6.8  ALBUMIN 2.6* 2.2* 2.0*   No results for input(s): "LIPASE", "AMYLASE" in the last 168 hours. No results for input(s): "AMMONIA" in the last 168 hours. Coagulation Profile: Recent Labs  Lab 05/27/22 1332  INR 1.4*   Cardiac Enzymes: No results for input(s): "CKTOTAL", "CKMB", "CKMBINDEX", "TROPONINI" in the last 168 hours. BNP (last 3  results) No results for input(s): "PROBNP" in the last 8760 hours. HbA1C: No results for input(s): "HGBA1C" in the last 72 hours. CBG: Recent Labs  Lab 05/28/22 1656 05/28/22 2008 05/29/22 0014 05/29/22 0404 05/29/22 0746  GLUCAP 150* 186* 229* 283* 232*   Lipid Profile: No results for input(s): "CHOL", "HDL", "LDLCALC", "TRIG", "CHOLHDL", "LDLDIRECT" in the last 72 hours. Thyroid Function Tests: No results for input(s): "TSH", "T4TOTAL", "FREET4", "T3FREE", "THYROIDAB" in the last 72 hours. Anemia Panel: No results for input(s): "VITAMINB12", "FOLATE", "FERRITIN", "TIBC", "IRON", "RETICCTPCT" in the last 72 hours. Sepsis Labs: Recent Labs  Lab 05/27/22 1332 05/27/22 1543  LATICACIDVEN 1.5 1.1    Recent Results (from the past 240 hour(s))  Culture, blood (Routine x 2)     Status: None (Preliminary result)   Collection Time: 05/27/22 11:52 AM   Specimen: BLOOD  Result Value Ref Range Status   Specimen Description BLOOD BLOOD RIGHT FOREARM  Final   Special Requests   Final    BOTTLES DRAWN AEROBIC AND ANAEROBIC Blood Culture adequate volume   Culture   Final    NO GROWTH 2 DAYS Performed at [facility], [address], [place], [place] [address]    Report Status PENDING  Incomplete  Culture, blood (Routine x 2)     Status: None (Preliminary result)   Collection Time: 05/27/22  1:33 PM   Specimen: BLOOD  Result Value Ref Range Status   Specimen Description   Final    BLOOD LEFT ANTECUBITAL Performed at [facility], [address] [place], [place] [address]    Special Requests   Final    BOTTLES DRAWN AEROBIC AND ANAEROBIC Blood Culture adequate volume Performed at [facility], [address] [place], [place] [address]    Culture  Setup Time   Final    GRAM POSITIVE COCCI IN BOTH AEROBIC AND ANAEROBIC BOTTLES Gram Stain Report Called to,Read Back By and Verified With: SULY VILLALOBOF AT [id number] BY SA ON 05/28/22 CRITICAL RESULT CALLED TO, READ BACK BY AND VERIFIED  WITH: RN Herold Harms [phone] @1931  FH Performed at [facility], [place] [address], [place], [place] [address]    Culture [facility] POSITIVE COCCI  Final   Report Status PENDING  Incomplete  Blood Culture ID Panel (Reflexed)     Status: Abnormal   Collection Time:  05/27/22  1:33 PM  Result Value Ref Range Status   Enterococcus faecalis NOT DETECTED NOT DETECTED Final   Enterococcus Faecium NOT DETECTED NOT DETECTED Final   Listeria monocytogenes NOT DETECTED NOT DETECTED Final   Staphylococcus species NOT DETECTED NOT DETECTED Final   Staphylococcus aureus (BCID) NOT DETECTED NOT DETECTED Final   Staphylococcus epidermidis NOT DETECTED NOT DETECTED Final   Staphylococcus lugdunensis NOT DETECTED NOT DETECTED Final   Streptococcus species DETECTED (A) NOT DETECTED Final    Comment: Not Enterococcus species, Streptococcus agalactiae, Streptococcus pyogenes, or Streptococcus pneumoniae. CRITICAL RESULT CALLED TO, READ BACK BY AND VERIFIED WITH: RN N. HEARP [phone] @1931  FH    Streptococcus agalactiae NOT DETECTED NOT DETECTED Final   Streptococcus pneumoniae NOT DETECTED NOT DETECTED Final   Streptococcus pyogenes NOT DETECTED NOT DETECTED Final   A.calcoaceticus-baumannii NOT DETECTED NOT DETECTED Final   Bacteroides fragilis NOT DETECTED NOT DETECTED Final   Enterobacterales NOT DETECTED NOT DETECTED Final   Enterobacter cloacae complex NOT DETECTED NOT DETECTED Final   Escherichia coli NOT DETECTED NOT DETECTED Final   Klebsiella aerogenes NOT DETECTED NOT DETECTED Final   Klebsiella oxytoca NOT DETECTED NOT DETECTED Final   Klebsiella pneumoniae NOT DETECTED NOT DETECTED Final   Proteus species NOT DETECTED NOT DETECTED Final   Salmonella species NOT DETECTED NOT DETECTED Final   Serratia marcescens NOT DETECTED NOT DETECTED Final   Haemophilus influenzae NOT DETECTED NOT DETECTED Final   Neisseria meningitidis NOT DETECTED NOT DETECTED Final   Pseudomonas aeruginosa NOT DETECTED NOT  DETECTED Final   Stenotrophomonas maltophilia NOT DETECTED NOT DETECTED Final   Candida albicans NOT DETECTED NOT DETECTED Final   Candida auris NOT DETECTED NOT DETECTED Final   Candida glabrata NOT DETECTED NOT DETECTED Final   Candida krusei NOT DETECTED NOT DETECTED Final   Candida parapsilosis NOT DETECTED NOT DETECTED Final   Candida tropicalis NOT DETECTED NOT DETECTED Final   Cryptococcus neoformans/gattii NOT DETECTED NOT DETECTED Final    Comment: Performed at [facility], [address] [place], [place] [address]  Remove and replace urinary cath (placed > 5 days) then obtain urine culture from new indwelling urinary catheter.     Status: None   Collection Time: 05/28/22  1:40 AM   Specimen: Urine, Catheterized  Result Value Ref Range Status   Specimen Description   Final    URINE, CATHETERIZED Performed at [facility], [address], [place], [place] [address]    Special Requests   Final    NONE Performed at [facility], [address], [place], [place] [address]    Culture   Final    NO GROWTH Performed at [facility], [place] [address], [place], [place] [address]    Report Status 05/29/2022 FINAL  Final         Radiology Studies: CT HEAD WO CONTRAST (5MM)  Result Date: 05/28/2022 CLINICAL DATA:  Altered mental status EXAM: CT HEAD WITHOUT CONTRAST TECHNIQUE: Contiguous axial images were obtained from the base of the skull through the vertex without intravenous contrast. RADIATION DOSE REDUCTION: This exam was performed according to the departmental dose-optimization program which includes automated exposure control, adjustment of the mA and/or kV according to patient size and/or use of iterative reconstruction technique. COMPARISON:  Prior head CT 04/19/2022 FINDINGS: Brain: No evidence of acute infarction, hemorrhage, hydrocephalus, extra-axial collection or mass lesion/mass effect. Stable mild atrophy. Vascular: No hyperdense vessel or unexpected  calcification. Skull: Normal. Negative for fracture or  focal lesion. Sinuses/Orbits: No acute finding. Other: Extensive punctate dermal calcifications. IMPRESSION: 1. No acute intracranial abnormality. Electronically Signed   By: Malachy Moan M.D.   On: 05/28/2022 08:42   CT Angio Chest PE W and/or Wo Contrast  Result Date: 05/27/2022 CLINICAL DATA:  Persistent tachycardia. EXAM: CT ANGIOGRAPHY CHEST WITH CONTRAST TECHNIQUE: Multidetector CT imaging of the chest was performed using the standard protocol during bolus administration of intravenous contrast. Multiplanar CT image reconstructions and MIPs were obtained to evaluate the vascular anatomy. RADIATION DOSE REDUCTION: This exam was performed according to the departmental dose-optimization program which includes automated exposure control, adjustment of the mA and/or kV according to patient size and/or use of iterative reconstruction technique. CONTRAST:  36mL OMNIPAQUE IOHEXOL 350 MG/ML SOLN COMPARISON:  Apr 19, 2022 FINDINGS: Cardiovascular: Enlarged heart. Heavy calcific atherosclerotic disease of the coronary arteries. Mediastinum/Nodes: No enlarged mediastinal, hilar, or axillary lymph nodes. Thyroid gland, trachea, and esophagus demonstrate no significant findings. Lungs/Pleura: Scarring versus atelectasis in bilateral lung bases. Biapical pleural thickening, left greater than right. Areas of ground-glass opacities in the left lung apex with associated mild architectural distortion. Upper Abdomen: No acute abnormality. Musculoskeletal: No chest wall abnormality. No acute or significant osseous findings. Review of the MIP images confirms the above findings. IMPRESSION: 1. No evidence of pulmonary embolism. 2. Enlarged heart with heavy calcific atherosclerotic disease of the coronary arteries. 3. Scarring versus atelectasis in bilateral lung bases. 4. Areas of ground-glass opacities in the left lung apex with associated mild architectural  distortion. This finding has stable since 2019, which supports scarring. 5. Biapical pleural thickening, left greater than right. Electronically Signed   By: Ted Mcalpine M.D.   On: 05/27/2022 17:28   CT ABDOMEN PELVIS W CONTRAST  Result Date: 05/27/2022 CLINICAL DATA:  Abdominal pain.  Sacral ulcer.  Fever. EXAM: CT ABDOMEN AND PELVIS WITH CONTRAST TECHNIQUE: Multidetector CT imaging of the abdomen and pelvis was performed using the standard protocol following bolus administration of intravenous contrast. RADIATION DOSE REDUCTION: This exam was performed according to the departmental dose-optimization program which includes automated exposure control, adjustment of the mA and/or kV according to patient size and/or use of iterative reconstruction technique. CONTRAST:  75mL OMNIPAQUE IOHEXOL 350 MG/ML SOLN COMPARISON:  CT abdomen and pelvis 04/13/2022 FINDINGS: Lower chest: There is some atelectasis in the lung bases. Hepatobiliary: No focal liver abnormality is seen. No gallstones, gallbladder wall thickening, or biliary dilatation. Pancreas: Unremarkable. No pancreatic ductal dilatation or surrounding inflammatory changes. Spleen: Normal in size without focal abnormality. Adrenals/Urinary Tract: Again seen is an extrarenal pelvis on the left. There is no hydronephrosis or perinephric fluid. The adrenal glands are within normal limits. The bladder is decompressed by Foley catheter. Stomach/Bowel: Rectal wall thickening with surrounding inflammation and presacral edema appears unchanged. There is no evidence for bowel obstruction or free air. There is a large amount of stool throughout the colon. Appendix is within normal limits. Small bowel is within normal limits. Percutaneous gastrostomy tube in place, new from prior. Stomach is nondilated. Vascular/Lymphatic: There are severe atherosclerotic calcifications of the aorta. Peripheral vascular calcifications are also seen. No evidence for aortic aneurysm.  No enlarged lymph nodes are identified. Reproductive: Prostate is unremarkable. Other: No ascites or focal abdominal wall hernia. Musculoskeletal: Sacral decubitus ulcer is again seen overlying the distal sacrum and coccyx. Previously identified fluid collection is no longer seen. The ulcer abuts the tip of the coccyx and distal sacrum with some questionable mild underlying erosive change of the  coccyx. IMPRESSION: 1. Sacral decubitus ulcer again seen extending to the level of the distal sacrum and coccyx. There are erosive changes of the coccyx compatible with osteomyelitis. 2. Rectal wall thickening with surrounding inflammation persists concerning for proctitis. 3. Foley catheter in place. 4. New gastrostomy tube present. 5. Severe atherosclerotic disease. Aortic Atherosclerosis (ICD10-I70.0). Electronically Signed   By: Ronney Asters M.D.   On: 05/27/2022 17:25   DG Chest Port 1 View  Result Date: 05/27/2022 CLINICAL DATA:  Provided history: Fever, sepsis protocol. EXAM: PORTABLE CHEST 1 VIEW COMPARISON:  Prior chest radiographs 05/19/2022 and earlier. FINDINGS: Shallow inspiration radiograph. Heart size within normal limits. Aortic atherosclerosis. No appreciable airspace consolidation or pulmonary edema. No evidence of pleural effusion or pneumothorax. No acute bony abnormality identified. Degenerative changes of the spine and left glenohumeral joint. IMPRESSION: Shallow inspiration radiograph. No evidence of acute cardiopulmonary abnormality. Aortic Atherosclerosis (ICD10-I70.0). Electronically Signed   By: Kellie Simmering D.O.   On: 05/27/2022 12:17        Scheduled Meds:  Chlorhexidine Gluconate Cloth  6 each Topical Q0600   enoxaparin (LOVENOX) injection  60 mg Subcutaneous QHS   feeding supplement (PROSource TF)  45 mL Per Tube BID   insulin aspart  0-9 Units Subcutaneous Q4H   nutrition supplement (JUVEN)  1 packet Per Tube BID BM   Continuous Infusions:  ceFEPime (MAXIPIME) IV 2 g  (05/29/22 0457)   feeding supplement (VITAL 1.5 CAL) 1,000 mL (05/28/22 1438)   metronidazole 500 mg (05/29/22 0813)   vancomycin 1,750 mg (05/28/22 1835)     LOS: 2 days    Time spent: 35 minutes    Ohn Bostic Darleen Crocker, DO [facility]  If 7PM-7AM, please contact night-coverage [email or web address] 05/29/2022, 11:22 AM

## 2022-05-29 NOTE — Progress Notes (Signed)
  Echocardiogram 2D Echocardiogram has been performed.  Christian [name] 05/29/2022, 2:21 PM

## 2022-05-29 NOTE — Procedures (Signed)
[facility] Procedure Note  05/29/22  Pre-procedure Diagnosis: Stage IV Sacral decubitus ulcer, osteomyelitis    Post-procedure Diagnosis: Same   Procedure(s) Performed: Excisional debridement 5X4cm and bone biopsy    Surgeon: Leatrice Jewels. [name], MD   Assistants: No qualified resident was available    Anesthesia: Morphine 4mg    Specimens: Culture wound; sacral bone biopsy    Estimated Blood Loss: Minimal  Wound Class: Dirty/ Infected    Procedure Indications: Christian [name] is a 71 yo with a sacral decubitus ulcer stage IV with exposed bone and osteomyelitis on CT.  Discussed with the patient and his daugther, Christian [name] debridement, risk of bleeding, and continued need for wound care, unlikely this will heal and bone biopsy to help with his osteomyelitis treatment.   Findings: Necrotic tissue over sacrum with bone exposed and some bone shards hanging off    Procedure: The patient placed on left lateral decubitus position, and the sacral wound measured 7cm X 10 cm X 4 cm deep. There was necrotic tissue overlying the sacrum with some tracking down to the anus on the left side. There was a fragment of bone hanging by fascia tissue at the inferior edge of the sacrum. Betadine and saline was used to wash the wound. With sharp excisional debridement necrotic tissue was excised measuring 5X4cm in size. The bone was already exposed and the shard that was hanging off inferiorly was excised and sent to pathology. A culture was obtained over the bone. The wound was bleeding slightly but was packed with saline dampened gauze and covered with ABD and paper tape. The patient some pain and morphine was given.   Predebridement:    Post debridement:    Final inspection revealed acceptable hemostasis. The patient tolerated the procedure well.     75, MD [facility] [address] [place], [place] [phone] (office)

## 2022-05-30 DIAGNOSIS — R6520 Severe sepsis without septic shock: Secondary | ICD-10-CM | POA: Diagnosis not present

## 2022-05-30 DIAGNOSIS — A419 Sepsis, unspecified organism: Secondary | ICD-10-CM | POA: Diagnosis not present

## 2022-05-30 LAB — CBC
HCT: 36.3 % — ABNORMAL LOW (ref 39.0–52.0)
Hemoglobin: 11 g/dL — ABNORMAL LOW (ref 13.0–17.0)
MCH: 27.3 pg (ref 26.0–34.0)
MCHC: 30.3 g/dL (ref 30.0–36.0)
MCV: 90.1 fL (ref 80.0–100.0)
Platelets: 300 10*3/uL (ref 150–400)
RBC: 4.03 MIL/uL — ABNORMAL LOW (ref 4.22–5.81)
RDW: 15.3 % (ref 11.5–15.5)
WBC: 6.8 10*3/uL (ref 4.0–10.5)
nRBC: 0 % (ref 0.0–0.2)

## 2022-05-30 LAB — GLUCOSE, CAPILLARY
Glucose-Capillary: 190 mg/dL — ABNORMAL HIGH (ref 70–99)
Glucose-Capillary: 203 mg/dL — ABNORMAL HIGH (ref 70–99)
Glucose-Capillary: 221 mg/dL — ABNORMAL HIGH (ref 70–99)
Glucose-Capillary: 237 mg/dL — ABNORMAL HIGH (ref 70–99)
Glucose-Capillary: 289 mg/dL — ABNORMAL HIGH (ref 70–99)
Glucose-Capillary: 298 mg/dL — ABNORMAL HIGH (ref 70–99)

## 2022-05-30 LAB — COMPREHENSIVE METABOLIC PANEL
ALT: 53 U/L — ABNORMAL HIGH (ref 0–44)
AST: 25 U/L (ref 15–41)
Albumin: 2.1 g/dL — ABNORMAL LOW (ref 3.5–5.0)
Alkaline Phosphatase: 61 U/L (ref 38–126)
Anion gap: 5 (ref 5–15)
BUN: 41 mg/dL — ABNORMAL HIGH (ref 8–23)
CO2: 25 mmol/L (ref 22–32)
Calcium: 9.6 mg/dL (ref 8.9–10.3)
Chloride: 111 mmol/L (ref 98–111)
Creatinine, Ser: 0.86 mg/dL (ref 0.61–1.24)
GFR, Estimated: >60 mL/min (ref >60)
Glucose, Bld: 206 mg/dL — ABNORMAL HIGH (ref 70–99)
Potassium: 4.5 mmol/L (ref 3.5–5.1)
Sodium: 141 mmol/L (ref 135–145)
Total Bilirubin: 0.5 mg/dL (ref 0.3–1.2)
Total Protein: 7.1 g/dL (ref 6.5–8.1)

## 2022-05-30 LAB — MAGNESIUM: Magnesium: 1.9 mg/dL (ref 1.7–2.4)

## 2022-05-30 MED ORDER — SENNOSIDES-DOCUSATE SODIUM 8.6-50 MG PO TABS
2.0000 | ORAL_TABLET | Freq: Two times a day (BID) | ORAL | Status: DC
  Administered 2022-05-30 – 2022-06-04 (×11): 2 via ORAL
  Filled 2022-05-30 (×11): qty 2

## 2022-05-30 MED ORDER — ARIPIPRAZOLE 10 MG PO TABS: 10.0000 mg | ORAL_TABLET | Freq: Every morning | ORAL | Status: DC

## 2022-05-30 MED ORDER — RIVAROXABAN 20 MG PO TABS
20.0000 mg | ORAL_TABLET | Freq: Every day | ORAL | Status: DC
  Administered 2022-05-30 – 2022-06-03 (×5): 20 mg via ORAL
  Filled 2022-05-30 (×5): qty 1

## 2022-05-30 MED ORDER — TAMSULOSIN HCL 0.4 MG PO CAPS
0.4000 mg | ORAL_CAPSULE | Freq: Every day | ORAL | Status: DC
  Administered 2022-05-30 – 2022-06-04 (×6): 0.4 mg via ORAL
  Filled 2022-05-30 (×6): qty 1

## 2022-05-30 MED ORDER — LIOTHYRONINE SODIUM 5 MCG PO TABS
10.0000 ug | ORAL_TABLET | Freq: Every day | ORAL | Status: DC
  Filled 2022-05-30 (×2): qty 2

## 2022-05-30 MED ORDER — ARIPIPRAZOLE 10 MG PO TABS
10.0000 mg | ORAL_TABLET | Freq: Every morning | ORAL | Status: DC
Start: 2022-05-30 — End: 2022-06-04
  Administered 2022-05-30 – 2022-06-04 (×6): 10 mg
  Filled 2022-05-30 (×6): qty 1

## 2022-05-30 MED ORDER — BENZTROPINE MESYLATE 1 MG PO TABS: 1.0000 mg | ORAL_TABLET | Freq: Two times a day (BID) | ORAL | Status: DC

## 2022-05-30 MED ORDER — BENZTROPINE MESYLATE 1 MG PO TABS
1.0000 mg | ORAL_TABLET | Freq: Two times a day (BID) | ORAL | Status: DC
  Administered 2022-05-30 – 2022-06-04 (×11): 1 mg
  Filled 2022-05-30 (×11): qty 1

## 2022-05-30 MED ORDER — LIOTHYRONINE SODIUM 5 MCG PO TABS
10.0000 ug | ORAL_TABLET | Freq: Every day | ORAL | Status: DC
Start: 2022-05-30 — End: 2022-06-04
  Administered 2022-05-30 – 2022-06-04 (×6): 10 ug
  Filled 2022-05-30 (×9): qty 2

## 2022-05-30 NOTE — Progress Notes (Signed)
Patient given a few sips of water, HOB elevated, noted patient coughing after each sip. MD Sherryll Burger made aware.

## 2022-05-30 NOTE — Progress Notes (Signed)
PROGRESS NOTE    Christian [name]  GNF:[id number] DOB: 1951-06-25 DOA: 05/27/2022 PCP: [name], MD   Brief Narrative:    Christian [name] is a 71 y.o. male with medical history significant for diabetes mellitus, CVA, bipolar disorder, coronary artery disease, PE and DVT, CKD 3, atrial fibrillation, OSA, gastrostomy tube and chronic Foley catheter. Patient reports to the ED from nursing home reports of altered mental status, increasing lethargy and fever, with tachycardia heart rate in the 130s.  Patient was admitted with concerns of severe sepsis in the setting of sacral decubitus ulcer which is now demonstrating findings of new osteomyelitis.  He is now noted to have strep bacteremia and ID is following.  Assessment & Plan:   Principal Problem:   Severe sepsis (HCC) Active Problems:   Urinary tract infection associated with catheterization of urinary tract (HCC)   Encephalopathy   Sacral decubitus ulcer, stage IV (HCC)   Chronic diastolic CHF (congestive heart failure) (HCC)   Hypertension   Type 2 diabetes mellitus with complication, without long-term current use of insulin (HCC)   OSA on CPAP   Diabetes mellitus type 2 in nonobese (HCC)   Pulmonary embolism (HCC)   Osteomyelitis (HCC)  Assessment and Plan:    Severe sepsis with strep bacteremia (HCC) Meeting sepsis criteria with fever of 103, tachycardia heart rates up to 140s, tachypnea to 36.  With evidence of endorgan dysfunction, and acute metabolic encephalopathy and possibly AKI.  WBC 8.9.  Source of infection likely sacral decubitus ulcer which is showing new osteomyelitis not present from prior imaging, and possible catheter associated UTI.  No suggestion of infection in the chest or abdomen. -Broad-spectrum antibiotics IV vancomycin, cefepime and metronidazole -Follow-up sensitivities on blood culture -Urine cultures with no growth - appreciate ongoing ID recommendations -General surgery has done bone biopsy  7/1 -2D echocardiogram with no vegetations noted on 7/1, consult to cardiology for TEE in a.m. keep n.p.o. after midnight and hold tube feeds   Sacral decubitus ulcer, stage IV (HCC) Large stage IV sacral decubitus ulcer.  Patient nonambulatory.  Poor functional status and debility after 2 prolonged recent hospitalizations in April and then May to June.  CT suggesting osteomyelitis of the coccyx.  No abscess. -Appreciate debridement and possible bone biopsy with general surgery 7/1, cultures pending   Encephalopathy-resolved Acute metabolic encephalopathy likely secondary to severe sepsis. -CT head with no acute findings -Resume home psychoactive medications      Urinary tract infection associated with catheterization of urinary tract (HCC) -Although this was suspected on admission, seems less likely with no growth in urine culture -UA also collected from old Foley, which has not been exchanged   Pulmonary embolism (HCC) Resume home Xarelto   Type 2 diabetes mellitus with complication, without long-term current use of insulin (HCC) Controlled, A1c 7.6.  Glucose 272.   -Hold home long-acting insulin 15 units twice daily - SSI- S     Hypertension Stable. -Hold metoprolol 50 mg daily while n.p.o. and until blood pressures further improved -As needed labetalol for systolic greater than 170     DVT prophylaxis: Xarelto Code Status: Full Family Communication: None at bedside Disposition Plan:  Status is: Inpatient Remains inpatient appropriate because: IV medications     Skin Assessment:   I have examined the patient's skin and I agree with the wound assessment as performed by the wound care RN as outlined below:          Pressure Injury 03/31/22 Sacrum Mid  Stage 4 - Full thickness tissue loss with exposed bone, tendon or muscle. (Active)  03/31/22 1933  Location: Sacrum  Location Orientation: Mid  Staging: Stage 4 - Full thickness tissue loss with exposed bone, tendon or  muscle.  Wound Description (Comments):   Present on Admission: Yes  Dressing Type Gauze (Comment) 05/28/22 0000     Pressure Injury 05/08/22 Heel Right Deep Tissue Pressure Injury - Purple or maroon localized area of discolored intact skin or blood-filled blister due to damage of underlying soft tissue from pressure and/or shear. (Active)  05/08/22 1600  Location: Heel  Location Orientation: Right  Staging: Deep Tissue Pressure Injury - Purple or maroon localized area of discolored intact skin or blood-filled blister due to damage of underlying soft tissue from pressure and/or shear.  Wound Description (Comments):   Present on Admission: No  Dressing Type None 05/28/22 0000      Consultants:  Gen Surg Palliative care ID Cardiology   Procedures:  Bone biopsy to decubitus ulcer on 7/1  Antimicrobials:  Anti-infectives (From admission, onward)    Start     Dose/Rate Route Frequency Ordered Stop   05/28/22 1800  vancomycin (VANCOREADY) IVPB 1750 mg/350 mL        1,750 mg 175 mL/hr over 120 Minutes Intravenous Every 24 hours 05/27/22 1753     05/27/22 2345  metroNIDAZOLE (FLAGYL) IVPB 500 mg        500 mg 100 mL/hr over 60 Minutes Intravenous Every 12 hours 05/27/22 2337     05/27/22 2200  ceFEPIme (MAXIPIME) 2 g in sodium chloride 0.9 % 100 mL IVPB        2 g 200 mL/hr over 30 Minutes Intravenous Every 8 hours 05/27/22 1750     05/27/22 1800  vancomycin (VANCOREADY) IVPB 2000 mg/400 mL        2,000 mg 200 mL/hr over 120 Minutes Intravenous  Once 05/27/22 1750 05/27/22 1930   05/27/22 1700  cefTRIAXone (ROCEPHIN) 1 g in sodium chloride 0.9 % 100 mL IVPB        1 g 200 mL/hr over 30 Minutes Intravenous  Once 05/27/22 1651 05/27/22 1812       Subjective: Patient seen and evaluated today with no new acute complaints or concerns. No acute concerns or events noted overnight.  Objective: Vitals:   05/29/22 2100 05/29/22 2214 05/30/22 0100 05/30/22 0700  BP: 122/81  112/76  119/84  Pulse: 74 75 (!) 101 96  Resp: (!) 22 (!) 21 18 20   Temp: 98.5 F (36.9 C)  98.7 F (37.1 C) 98.8 F (37.1 C)  TempSrc: Oral  Oral Oral  SpO2: 97%  100% 100%  Weight:      Height:        Intake/Output Summary (Last 24 hours) at 05/30/2022 0939 Last data filed at 05/29/2022 1800 Gross per 24 hour  Intake 2409 ml  Output 1300 ml  Net 1109 ml   Filed Weights   05/27/22 1139 05/27/22 2300 05/29/22 1000  Weight: 136 kg 95.7 kg 101.8 kg    Examination:  General exam: Appears calm and comfortable  Respiratory system: Clear to auscultation. Respiratory effort normal. Cardiovascular system: S1 & S2 heard, RRR.  Gastrointestinal system: Abdomen is soft Central nervous system: Alert and awake Extremities: No edema Skin: Decubitus wound is noted prior Psychiatry: Flat affect.    Data Reviewed: I have personally reviewed following labs and imaging studies  CBC: Recent Labs  Lab 05/27/22 1332 05/28/22 0556 05/29/22 0551 05/30/22  0551  WBC 8.9 10.8* 5.3 6.8  NEUTROABS 7.6  --   --   --   HGB 13.5 13.1 11.0* 11.0*  HCT 44.0 42.4 35.9* 36.3*  MCV 88.2 89.3 90.0 90.1  PLT 399 336 339 300   Basic Metabolic Panel: Recent Labs  Lab 05/27/22 1332 05/28/22 0556 05/29/22 0551 05/30/22 0551  NA 132* 137 138 141  K 5.4* 4.8 4.1 4.5  CL 99 106 113* 111  CO2 25 25 20* 25  GLUCOSE 272* 167* 249* 206*  BUN 69* 55* 48* 41*  CREATININE 1.38* 1.17 0.94 0.86  CALCIUM 9.5 9.3 9.0 9.6  MG  --   --  2.2 1.9   GFR: Estimated Creatinine Clearance: 101.5 mL/min (by C-G formula based on SCr of 0.86 mg/dL). Liver Function Tests: Recent Labs  Lab 05/27/22 1332 05/28/22 0556 05/29/22 0551 05/30/22 0551  AST 50* 109* 54* 25  ALT 72* 100* 74* 53*  ALKPHOS 80 71 63 61  BILITOT 0.5 0.6 0.5 0.5  PROT 8.2* 7.5 6.8 7.1  ALBUMIN 2.6* 2.2* 2.0* 2.1*   No results for input(s): "LIPASE", "AMYLASE" in the last 168 hours. No results for input(s): "AMMONIA" in the last 168  hours. Coagulation Profile: Recent Labs  Lab 05/27/22 1332  INR 1.4*   Cardiac Enzymes: No results for input(s): "CKTOTAL", "CKMB", "CKMBINDEX", "TROPONINI" in the last 168 hours. BNP (last 3 results) No results for input(s): "PROBNP" in the last 8760 hours. HbA1C: No results for input(s): "HGBA1C" in the last 72 hours. CBG: Recent Labs  Lab 05/29/22 1636 05/29/22 2047 05/30/22 0002 05/30/22 0415 05/30/22 0726  GLUCAP 233* 266* 221* 203* 190*   Lipid Profile: No results for input(s): "CHOL", "HDL", "LDLCALC", "TRIG", "CHOLHDL", "LDLDIRECT" in the last 72 hours. Thyroid Function Tests: No results for input(s): "TSH", "T4TOTAL", "FREET4", "T3FREE", "THYROIDAB" in the last 72 hours. Anemia Panel: No results for input(s): "VITAMINB12", "FOLATE", "FERRITIN", "TIBC", "IRON", "RETICCTPCT" in the last 72 hours. Sepsis Labs: Recent Labs  Lab 05/27/22 1332 05/27/22 1543  LATICACIDVEN 1.5 1.1    Recent Results (from the past 240 hour(s))  Culture, blood (Routine x 2)     Status: None (Preliminary result)   Collection Time: 05/27/22 11:52 AM   Specimen: BLOOD  Result Value Ref Range Status   Specimen Description BLOOD BLOOD RIGHT FOREARM  Final   Special Requests   Final    BOTTLES DRAWN AEROBIC AND ANAEROBIC Blood Culture adequate volume   Culture   Final    NO GROWTH 3 DAYS Performed at [facility], [address] [place], [place] [address]    Report Status PENDING  Incomplete  Culture, blood (Routine x 2)     Status: Abnormal   Collection Time: 05/27/22  1:33 PM   Specimen: BLOOD  Result Value Ref Range Status   Specimen Description   Final    BLOOD LEFT ANTECUBITAL Performed at [facility], [address] [place], [place] [address]    Special Requests   Final    BOTTLES DRAWN AEROBIC AND ANAEROBIC Blood Culture adequate volume Performed at [facility], [address], [place], [place] [address]    Culture  Setup Time   Final    GRAM POSITIVE COCCI IN BOTH  AEROBIC AND ANAEROBIC BOTTLES Gram Stain Report Called to,Read Back By and Verified With: SULY VILLALOBOF AT 1329 BY SA ON 05/28/22 CRITICAL RESULT CALLED TO, READ BACK BY AND VERIFIED WITH: RN Tillman Sers [phone]  FH    Culture (  A)  Final    STREPTOCOCCUS CONSTELLATUS THE SIGNIFICANCE OF ISOLATING THIS ORGANISM FROM A SINGLE SET OF BLOOD CULTURES WHEN MULTIPLE SETS ARE DRAWN IS UNCERTAIN. PLEASE NOTIFY THE MICROBIOLOGY DEPARTMENT WITHIN ONE WEEK IF SPECIATION AND SENSITIVITIES ARE REQUIRED. Performed at [facility], [address], [place], [place] [address]    Report Status 05/30/2022 FINAL  Final  Blood Culture ID Panel (Reflexed)     Status: Abnormal   Collection Time: 05/27/22  1:33 PM  Result Value Ref Range Status   Enterococcus faecalis NOT DETECTED NOT DETECTED Final   Enterococcus Faecium NOT DETECTED NOT DETECTED Final   Listeria monocytogenes NOT DETECTED NOT DETECTED Final   Staphylococcus species NOT DETECTED NOT DETECTED Final   Staphylococcus aureus (BCID) NOT DETECTED NOT DETECTED Final   Staphylococcus epidermidis NOT DETECTED NOT DETECTED Final   Staphylococcus lugdunensis NOT DETECTED NOT DETECTED Final   Streptococcus species DETECTED (A) NOT DETECTED Final    Comment: Not Enterococcus species, Streptococcus agalactiae, Streptococcus pyogenes, or Streptococcus pneumoniae. CRITICAL RESULT CALLED TO, READ BACK BY AND VERIFIED WITH: RN NMila Homer [phone] @1931  FH    Streptococcus agalactiae NOT DETECTED NOT DETECTED Final   Streptococcus pneumoniae NOT DETECTED NOT DETECTED Final   Streptococcus pyogenes NOT DETECTED NOT DETECTED Final   A.calcoaceticus-baumannii NOT DETECTED NOT DETECTED Final   Bacteroides fragilis NOT DETECTED NOT DETECTED Final   Enterobacterales NOT DETECTED NOT DETECTED Final   Enterobacter cloacae complex NOT DETECTED NOT DETECTED Final   Escherichia coli NOT DETECTED NOT DETECTED Final   Klebsiella aerogenes NOT DETECTED NOT DETECTED  Final   Klebsiella oxytoca NOT DETECTED NOT DETECTED Final   Klebsiella pneumoniae NOT DETECTED NOT DETECTED Final   Proteus species NOT DETECTED NOT DETECTED Final   Salmonella species NOT DETECTED NOT DETECTED Final   Serratia marcescens NOT DETECTED NOT DETECTED Final   Haemophilus influenzae NOT DETECTED NOT DETECTED Final   Neisseria meningitidis NOT DETECTED NOT DETECTED Final   Pseudomonas aeruginosa NOT DETECTED NOT DETECTED Final   Stenotrophomonas maltophilia NOT DETECTED NOT DETECTED Final   Candida albicans NOT DETECTED NOT DETECTED Final   Candida auris NOT DETECTED NOT DETECTED Final   Candida glabrata NOT DETECTED NOT DETECTED Final   Candida krusei NOT DETECTED NOT DETECTED Final   Candida parapsilosis NOT DETECTED NOT DETECTED Final   Candida tropicalis NOT DETECTED NOT DETECTED Final   Cryptococcus neoformans/gattii NOT DETECTED NOT DETECTED Final    Comment: Performed at [facility], [address], [place], [place] [address]  Remove and replace urinary cath (placed > 5 days) then obtain urine culture from new indwelling urinary catheter.     Status: None   Collection Time: 05/28/22  1:40 AM   Specimen: Urine, Catheterized  Result Value Ref Range Status   Specimen Description   Final    URINE, CATHETERIZED Performed at [facility], [address], [place], [place] [address]    Special Requests   Final    NONE Performed at [facility], [address], [place], [place] [address]    Culture   Final    NO GROWTH Performed at [facility], [address] [place], [place] [address]    Report Status 05/29/2022 FINAL  Final  Culture, blood (Routine X 2) w Reflex to ID Panel     Status: None (Preliminary result)   Collection Time: 05/29/22  5:50 AM   Specimen: Right Antecubital; Blood  Result Value Ref Range Status  Specimen Description RIGHT ANTECUBITAL BOTTLES DRAWN AEROBIC ONLY  Final   Special Requests Blood Culture adequate volume  Final    Culture   Final    NO GROWTH < 24 HOURS Performed at [facility], [address] [place], [place] [address]    Report Status PENDING  Incomplete  Culture, blood (Routine X 2) w Reflex to ID Panel     Status: None (Preliminary result)   Collection Time: 05/29/22  6:04 AM   Specimen: BLOOD RIGHT HAND  Result Value Ref Range Status   Specimen Description BLOOD RIGHT HAND BOTTLES DRAWN AEROBIC ONLY  Final   Special Requests   Final    Blood Culture results may not be optimal due to an excessive volume of blood received in culture bottles Performed at [facility], [address] [place], [place] [address]    Culture PENDING  Incomplete   Report Status PENDING  Incomplete  Aerobic Culture w Gram Stain (superficial specimen)     Status: None (Preliminary result)   Collection Time: 05/29/22  1:13 PM   Specimen: Sacral  Result Value Ref Range Status   Specimen Description   Final    SACRAL Performed at [facility], [address] [place], [place] [address]    Special Requests   Final    BONE BIOPSY Performed at [facility], [address], [place], [place] [address]    Gram Stain   Final    NO SQUAMOUS EPITHELIAL CELLS SEEN FEW WBC SEEN MODERATE GRAM NEGATIVE RODS MODERATE GRAM POSITIVE COCCI FEW GRAM POSITIVE RODS Performed at [facility], [address], [place], [place] [address]    Culture PENDING  Incomplete   Report Status PENDING  Incomplete         Radiology Studies: ECHOCARDIOGRAM LIMITED  Result Date: 05/29/2022    ECHOCARDIOGRAM LIMITED REPORT   Patient Name:   MARRION ACCOMANDO Date of Exam: 05/29/2022 Medical Rec #:  [id number]        Height:       72.0 in Accession #:    [id number]       Weight:       224.4 lb Date of Birth:  [birth year]        BSA:          2.238 m Patient Age:    70 years         BP:           100/65 mmHg Patient Gender: M                HR:           96 bpm. [facility] Procedure: Limited Echo, Intracardiac Opacification  Agent, Cardiac Doppler and            Color Doppler Indications:    Bacteremia  History:        Patient has prior history of Echocardiogram examinations, most                 recent 04/21/2022. Previous Myocardial Infarction,                 Signs/Symptoms:Edema, Syncope, Altered Mental Status and Fever;                 Risk Factors:Sleep Apnea, Diabetes and Hypertension. Shock.  Sonographer:    Sheralyn Boatman RDCS Referring Phys: [id number] Kathlynn Grate  Sonographer Comments: Technically difficult study due to poor echo windows. Difficult  images. Tube feeding dressing in subcostal region. Very low parastenal window. Definity attempted from two different IV sites. Suboptimal Definity visualization. IMPRESSIONS  1. Limited study. Definity images are not adequate.  2. Left ventricular ejection fraction, by estimation, is 45 to 50%. The left ventricle has mildly decreased function. The left ventricle demonstrates regional wall motion abnormalities (see scoring diagram/findings for description). There is mild left ventricular hypertrophy.  3. The mitral valve is degenerative. Trivial mitral valve regurgitation. Moderate mitral annular calcification.  4. The aortic valve was not well visualized. Aortic valve regurgitation is not visualized. Aortic valve sclerosis/calcification is present, without any evidence of aortic stenosis.  5. Tricuspid regurgitation signal is inadequate for assessing PA pressure.  6. The inferior vena cava is normal in size with greater than 50% respiratory variability, suggesting right atrial pressure of 3 mmHg. Comparison(s): Prior images reviewed side by side. LVEF and wall motion abnormalities similar to study from May 16. FINDINGS  Left Ventricle: Left ventricular ejection fraction, by estimation, is 45 to 50%. The left ventricle has mildly decreased function. The left ventricle demonstrates regional wall motion abnormalities. Definity contrast agent was given IV to delineate the left ventricular  endocardial borders. The left ventricular internal cavity size was normal in size. There is mild left ventricular hypertrophy.  LV Wall Scoring: The mid and distal anterior septum, entire apex, and mid inferoseptal segment are hypokinetic. The anterior wall, antero-lateral wall, inferior wall, posterior wall, basal anteroseptal segment, and basal inferoseptal segment are normal. Right Ventricle: Tricuspid regurgitation signal is inadequate for assessing PA pressure. Pericardium: Trivial pericardial effusion is present. The pericardial effusion is anterior to the right ventricle. Mitral Valve: The mitral valve is degenerative in appearance. Moderate mitral annular calcification. Trivial mitral valve regurgitation. Tricuspid Valve: The tricuspid valve is grossly normal. Tricuspid valve regurgitation is trivial. Aortic Valve: The aortic valve was not well visualized. Aortic valve regurgitation is not visualized. Aortic valve sclerosis/calcification is present, without any evidence of aortic stenosis. Aorta: The aortic root is normal in size and structure. Venous: The inferior vena cava is normal in size with greater than 50% respiratory variability, suggesting right atrial pressure of 3 mmHg. LEFT VENTRICLE PLAX 2D LVIDd:         5.50 cm LVIDs:         4.10 cm LV PW:         1.20 cm LV IVS:        1.10 cm LVOT diam:     2.30 cm LVOT Area:     4.15 cm  LEFT ATRIUM         Index LA diam:    3.70 cm 1.65 cm/m   AORTA Ao Root diam: 3.20 cm  SHUNTS Systemic Diam: 2.30 cm Nona Dell MD Electronically signed by Nona Dell MD Signature Date/Time: 05/29/2022/3:39:38 PM    Final         Scheduled Meds:  ARIPiprazole  10 mg Oral q morning   benztropine  1 mg Oral BID   Chlorhexidine Gluconate Cloth  6 each Topical Q0600   feeding supplement (PROSource TF)  45 mL Per Tube BID   insulin aspart  0-9 Units Subcutaneous Q4H   liothyronine  10 mcg Oral Daily   nutrition supplement (JUVEN)  1 packet Per Tube BID  BM   rivaroxaban  20 mg Oral Daily   senna-docusate  2 tablet Oral BID   tamsulosin  0.4 mg Oral Daily   Continuous Infusions:  ceFEPime (MAXIPIME) IV 2 g (  05/30/22 0521)   feeding supplement (VITAL 1.5 CAL) 1,000 mL (05/30/22 0735)   metronidazole 500 mg (05/30/22 5427)   vancomycin 1,750 mg (05/29/22 1825)     LOS: 3 days    Time spent: 35 minutes    Loveah Like D Sherryll Burger, DO [facility]  If 7PM-7AM, please contact night-coverage [email or web address] 05/30/2022, 9:39 AM

## 2022-05-30 NOTE — Progress Notes (Signed)
Pharmacy Antibiotic Note  Christian [name] is a 71 y.o. male admitted on 05/27/2022 with  ongoing wound infection .  Pharmacy has been consulted for Vancomycin / Cefepime dosing.  Plan: Continue Vancomycin  1750 mg iv Q 24 hours Cefepime 2 grams iv Q 8 hours F/U sensitivities and ID recommendations   Height: 6' (182.9 cm) Weight: 101.8 kg (224 lb 6.9 oz) IBW/kg (Calculated) : 77.6  Temp (24hrs), Avg:98.6 F (37 C), Min:98.4 F (36.9 C), Max:98.8 F (37.1 C)  Recent Labs  Lab 05/27/22 1332 05/27/22 1543 05/28/22 0556 05/29/22 0551 05/30/22 0551  WBC 8.9  --  10.8* 5.3 6.8  CREATININE 1.38*  --  1.17 0.94 0.86  LATICACIDVEN 1.5 1.1  --   --   --      Estimated Creatinine Clearance: 101.5 mL/min (by C-G formula based on SCr of 0.86 mg/dL).    Allergies  Allergen Reactions   Amlodipine Swelling and Other (See Comments)    Legs became swollen   Onglyza [Saxagliptin] Other (See Comments)    "Allergic," per [facility]   Zyprexa [Olanzapine] Other (See Comments)    Allergy noted by ACT Team. No reaction specified (??)- "Allergic," per [facility]   Ritalin [Methylphenidate] Anxiety and Other (See Comments)    "Maniac"    Vancomycin 6/29> Cefepime 6/29> Flagyl 6/29 >>  6/29 Bcx: strep constellatus 7/1 Sacral cx: gram neg rods and gram + cocci 04/11/22 Bcx: strep constellatus: int to CTX/pen   Thank you for allowing pharmacy to be a part of this patient's care.  Judeth Cornfield, PharmD Clinical Pharmacist 05/30/2022 9:47 AM

## 2022-05-31 ENCOUNTER — Encounter (HOSPITAL_COMMUNITY): Payer: Self-pay | Admitting: Internal Medicine

## 2022-05-31 ENCOUNTER — Inpatient Hospital Stay: Payer: Self-pay

## 2022-05-31 DIAGNOSIS — Z7189 Other specified counseling: Secondary | ICD-10-CM | POA: Diagnosis not present

## 2022-05-31 DIAGNOSIS — R7881 Bacteremia: Secondary | ICD-10-CM | POA: Diagnosis not present

## 2022-05-31 DIAGNOSIS — A419 Sepsis, unspecified organism: Secondary | ICD-10-CM | POA: Diagnosis not present

## 2022-05-31 DIAGNOSIS — L89154 Pressure ulcer of sacral region, stage 4: Secondary | ICD-10-CM | POA: Diagnosis not present

## 2022-05-31 DIAGNOSIS — B954 Other streptococcus as the cause of diseases classified elsewhere: Secondary | ICD-10-CM

## 2022-05-31 DIAGNOSIS — Z515 Encounter for palliative care: Secondary | ICD-10-CM

## 2022-05-31 DIAGNOSIS — R6520 Severe sepsis without septic shock: Secondary | ICD-10-CM | POA: Diagnosis not present

## 2022-05-31 DIAGNOSIS — M8618 Other acute osteomyelitis, other site: Secondary | ICD-10-CM | POA: Diagnosis not present

## 2022-05-31 DIAGNOSIS — M4628 Osteomyelitis of vertebra, sacral and sacrococcygeal region: Secondary | ICD-10-CM

## 2022-05-31 LAB — CBC
HCT: 32.8 % — ABNORMAL LOW (ref 39.0–52.0)
Hemoglobin: 10.1 g/dL — ABNORMAL LOW (ref 13.0–17.0)
MCH: 27.4 pg (ref 26.0–34.0)
MCHC: 30.8 g/dL (ref 30.0–36.0)
MCV: 89.1 fL (ref 80.0–100.0)
Platelets: 275 10*3/uL (ref 150–400)
RBC: 3.68 MIL/uL — ABNORMAL LOW (ref 4.22–5.81)
RDW: 15.2 % (ref 11.5–15.5)
WBC: 7.8 10*3/uL (ref 4.0–10.5)
nRBC: 0 % (ref 0.0–0.2)

## 2022-05-31 LAB — COMPREHENSIVE METABOLIC PANEL
ALT: 38 U/L (ref 0–44)
AST: 19 U/L (ref 15–41)
Albumin: 2 g/dL — ABNORMAL LOW (ref 3.5–5.0)
Alkaline Phosphatase: 52 U/L (ref 38–126)
Anion gap: 4 — ABNORMAL LOW (ref 5–15)
BUN: 34 mg/dL — ABNORMAL HIGH (ref 8–23)
CO2: 21 mmol/L — ABNORMAL LOW (ref 22–32)
Calcium: 9.1 mg/dL (ref 8.9–10.3)
Chloride: 109 mmol/L (ref 98–111)
Creatinine, Ser: 0.73 mg/dL (ref 0.61–1.24)
GFR, Estimated: >60 mL/min (ref >60)
Glucose, Bld: 157 mg/dL — ABNORMAL HIGH (ref 70–99)
Potassium: 4.2 mmol/L (ref 3.5–5.1)
Sodium: 134 mmol/L — ABNORMAL LOW (ref 135–145)
Total Bilirubin: 0.5 mg/dL (ref 0.3–1.2)
Total Protein: 6.5 g/dL (ref 6.5–8.1)

## 2022-05-31 LAB — GLUCOSE, CAPILLARY
Glucose-Capillary: 145 mg/dL — ABNORMAL HIGH (ref 70–99)
Glucose-Capillary: 161 mg/dL — ABNORMAL HIGH (ref 70–99)
Glucose-Capillary: 168 mg/dL — ABNORMAL HIGH (ref 70–99)
Glucose-Capillary: 179 mg/dL — ABNORMAL HIGH (ref 70–99)
Glucose-Capillary: 260 mg/dL — ABNORMAL HIGH (ref 70–99)
Glucose-Capillary: 263 mg/dL — ABNORMAL HIGH (ref 70–99)
Glucose-Capillary: 287 mg/dL — ABNORMAL HIGH (ref 70–99)

## 2022-05-31 LAB — CULTURE, BLOOD (ROUTINE X 2): Special Requests: ADEQUATE

## 2022-05-31 LAB — MAGNESIUM: Magnesium: 1.7 mg/dL (ref 1.7–2.4)

## 2022-05-31 MED ORDER — FLUCONAZOLE IN SODIUM CHLORIDE 200-0.9 MG/100ML-% IV SOLN
200.0000 mg | Freq: Once | INTRAVENOUS | Status: AC
  Administered 2022-05-31: 200 mg via INTRAVENOUS
  Filled 2022-05-31: qty 100

## 2022-05-31 MED ORDER — PIPERACILLIN-TAZOBACTAM 3.375 G IVPB
3.3750 g | Freq: Three times a day (TID) | INTRAVENOUS | Status: DC
  Administered 2022-05-31 – 2022-06-02 (×6): 3.375 g via INTRAVENOUS
  Filled 2022-05-31 (×6): qty 50

## 2022-05-31 MED ORDER — FLUCONAZOLE 100MG IVPB
100.0000 mg | INTRAVENOUS | Status: DC
  Administered 2022-06-01 – 2022-06-03 (×3): 100 mg via INTRAVENOUS
  Filled 2022-05-31 (×4): qty 50

## 2022-05-31 MED ORDER — SODIUM CHLORIDE 0.9% FLUSH
10.0000 mL | Freq: Two times a day (BID) | INTRAVENOUS | Status: DC
  Administered 2022-06-01 – 2022-06-04 (×5): 10 mL

## 2022-05-31 MED ORDER — SODIUM CHLORIDE 0.9% FLUSH: 10.0000 mL | INTRAVENOUS | Status: DC | PRN

## 2022-05-31 NOTE — TOC Progression Note (Signed)
Transition of Care [facility]) - Progression Note    Patient Details  Name: Christian [name] MRN: [id number] Date of Birth: 1951-08-07  Transition of Care [facility]) CM/SW Contact  Leitha Bleak, RN Phone Number: 05/31/2022, 10:38 AM  Clinical Narrative:   Patient will get a PICC line today and need IV antibiotics, Clydie Braun at [facility] updated. TOC CMA starting INS AUTH today.   Expected Discharge Plan: Skilled Nursing Facility Barriers to Discharge: Continued Medical Work up  Expected Discharge Plan and Services Expected Discharge Plan: Skilled Nursing Facility     Post Acute Care Choice: Skilled Nursing Facility Living arrangements for the past 2 months: Skilled Nursing Facility                      Readmission Risk Interventions    05/28/2022   12:57 PM 05/14/2022   12:39 PM 04/13/2022    3:06 PM  Readmission Risk Prevention Plan  Transportation Screening Complete Complete   PCP or Specialist Appt within 3-5 Days Complete  Complete  HRI or Home Care Consult Complete  Complete  Social Work Consult for Recovery Care Planning/Counseling   Complete  Palliative Care Screening Not Complete  Not Applicable  Medication Review Oceanographer) Complete Complete Referral to Pharmacy  PCP or Specialist appointment within 3-5 days of discharge  Complete   HRI or Home Care Consult  Complete   Palliative Care Screening  Not Applicable   Skilled Nursing Facility  Complete

## 2022-05-31 NOTE — Consult Note (Signed)
CARDIOLOGY CONSULT NOTE       Patient ID: Christian [name] MRN: [id number] DOB/AGE: 02-Jun-1951 71 y.o.  Admit date: 05/27/2022 Referring Physician: Manuella Ghazi Primary Physician: Donald Prose, MD Primary Cardiologist: Christophe Louis in [place] Reason for Consultation: TEE  Principal Problem:   Severe sepsis [facility]) Active Problems:   Hypertension   Type 2 diabetes mellitus with complication, without long-term current use of insulin (HCC)   OSA on CPAP   Urinary tract infection associated with catheterization of urinary tract (HCC)   Diabetes mellitus type 2 in nonobese (HCC)   Pulmonary embolism (HCC)   Encephalopathy   Chronic diastolic CHF (congestive heart failure) ([place])   Sacral decubitus ulcer, stage IV (HCC)   Osteomyelitis (HCC)   HPI:  71 y.o. debilitated black male Currently non ambulatory with PEG tube. Stage 4 sacral decubitus with osteomyelitis. History of LE venous dx and PVD. CAD with cath 04/01/20 showing RCA occlusion and moderate LAD dx.  Asked to consider TEE for strep constellatus bacteremia. Patient is currently afebrile on Maxipime, Vanc and flagyl He has had 3 echos recently 04/13/22, TEE 04/21/22 and 05/29/22 with MAC and AV sclerosis EF 45% .  No evidence of SBE or vegetations on any. He has poor gag reflex and swallowing issues with PEG tube and increased risk for aspiration   ROS All other systems reviewed and negative except as noted above  Past Medical History:  Diagnosis Date   Bilateral lower extremity edema: chronic with venous stasis changes 06/02/2014   Bipolar 1 disorder (HCC)    Bipolar disorder (HCC)    CHF (congestive heart failure) (HCC)    Chronic kidney disease    Gout    Gout    Hx of blood clots    Hypertension    Mood swings    OSA on CPAP 06/02/2014   Other and unspecified hyperlipidemia 06/02/2014   Type II or unspecified type diabetes mellitus with unspecified complication, uncontrolled 06/02/2014    Family History  Problem Relation Age of  Onset   Dementia Mother    Arthritis Father    Hypertension Father    Mental illness Sister    Diabetes Sister    Heart failure Neg Hx    Kidney failure Neg Hx    Cancer Neg Hx     Social History   Socioeconomic History   Marital status: Divorced    Spouse name: Not on file   Number of children: Not on file   Years of education: Not on file   Highest education level: Not on file  Occupational History   Not on file  Tobacco Use   Smoking status: Never   Smokeless tobacco: Never  Vaping Use   Vaping Use: Never used  Substance and Sexual Activity   Alcohol use: No   Drug use: No   Sexual activity: Not Currently  Other Topics Concern   Not on file  Social History Narrative   *The patient is divorced and is currently on disability. He lives alone and has one daughter.             Social Determinants of Health   Financial Resource Strain: Not on file  Food Insecurity: Not on file  Transportation Needs: Not on file  Physical Activity: Not on file  Stress: Not on file  Social Connections: Not on file  Intimate Partner Violence: Not on file    Past Surgical History:  Procedure Laterality Date   ABDOMINAL AORTOGRAM W/LOWER EXTREMITY N/A 09/16/2021  Procedure: ABDOMINAL AORTOGRAM W/LOWER EXTREMITY;  Surgeon: Broadus John, MD;  Location: [place] [facility];  Service: Cardiovascular;  Laterality: N/A;   IR GASTROSTOMY TUBE MOD SED  05/17/2022   LEFT HEART CATH AND CORONARY ANGIOGRAPHY Left 04/01/2020   Procedure: LEFT HEART CATH AND CORONARY ANGIOGRAPHY;  Surgeon: Dionisio David, MD;  Location: [place] [facility];  Service: Cardiovascular;  Laterality: Left;   PERIPHERAL VASCULAR BALLOON ANGIOPLASTY Left 09/16/2021   Procedure: PERIPHERAL VASCULAR BALLOON ANGIOPLASTY;  Surgeon: Broadus John, MD;  Location: [place] [facility];  Service: Cardiovascular;  Laterality: Left;  attempted to PTA posterior tibial artery; unable to cross lesion   TEE WITHOUT CARDIOVERSION  N/A 04/21/2022   Procedure: TRANSESOPHAGEAL ECHOCARDIOGRAM (TEE);  Surgeon: Donato Heinz, MD;  Location: [facility] ENDOSCOPY;  Service: Cardiovascular;  Laterality: N/A;      Current Facility-Administered Medications:    acetaminophen (TYLENOL) tablet 650 mg, 650 mg, Per Tube, Q6H PRN, 650 mg at 05/31/22 0536 **OR** acetaminophen (TYLENOL) suppository 650 mg, 650 mg, Rectal, Q6H PRN, Manuella Ghazi, Pratik D, DO   ARIPiprazole (ABILIFY) tablet 10 mg, 10 mg, Per Tube, q morning, Manuella Ghazi, Pratik D, DO, 10 mg at 05/30/22 1237   benztropine (COGENTIN) tablet 1 mg, 1 mg, Per Tube, BID, Manuella Ghazi, Pratik D, DO, 1 mg at 05/30/22 2218   ceFEPIme (MAXIPIME) 2 g in sodium chloride 0.9 % 100 mL IVPB, 2 g, Intravenous, Q8H, Emokpae, Ejiroghene E, MD, Stopped at 05/31/22 0538   Chlorhexidine Gluconate Cloth 2 % PADS 6 each, 6 each, Topical, Q0600, Emokpae, Ejiroghene E, MD, 6 each at 05/31/22 0555   feeding supplement (PROSource TF) liquid 45 mL, 45 mL, Per Tube, BID, Manuella Ghazi, Pratik D, DO, 45 mL at 05/30/22 2218   feeding supplement (VITAL 1.5 CAL) liquid 1,000 mL, 1,000 mL, Per Tube, Continuous, Manuella Ghazi, Pratik D, DO, Last Rate: 65 mL/hr at 05/30/22 0735, 1,000 mL at 05/30/22 0735   insulin aspart (novoLOG) injection 0-9 Units, 0-9 Units, Subcutaneous, Q4H, Emokpae, Ejiroghene E, MD, 2 Units at 05/31/22 0402   liothyronine (CYTOMEL) tablet 10 mcg, 10 mcg, Per Tube, Daily, Manuella Ghazi, Pratik D, DO, 10 mcg at 05/30/22 1239   metroNIDAZOLE (FLAGYL) IVPB 500 mg, 500 mg, Intravenous, Q12H, Emokpae, Ejiroghene E, MD, Stopped at 05/31/22 0000   nutrition supplement (JUVEN) (JUVEN) powder packet 1 packet, 1 packet, Per Tube, BID BM, Manuella Ghazi, Pratik D, DO, 1 packet at 05/30/22 1418   ondansetron (ZOFRAN) tablet 4 mg, 4 mg, Per Tube, Q6H PRN **OR** ondansetron (ZOFRAN) injection 4 mg, 4 mg, Intravenous, Q6H PRN, Manuella Ghazi, Pratik D, DO   rivaroxaban (XARELTO) tablet 20 mg, 20 mg, Oral, Q supper, Manuella Ghazi, Pratik D, DO, 20 mg at 05/30/22 1740    senna-docusate (Senokot-S) tablet 2 tablet, 2 tablet, Oral, BID, Manuella Ghazi, Pratik D, DO, 2 tablet at 05/30/22 2218   tamsulosin (FLOMAX) capsule 0.4 mg, 0.4 mg, Oral, Daily, Manuella Ghazi, Pratik D, DO, 0.4 mg at 05/30/22 1237   vancomycin (VANCOREADY) IVPB 1750 mg/350 mL, 1,750 mg, Intravenous, Q24H, Emokpae, Ejiroghene E, MD, Stopped at 05/30/22 1944  ARIPiprazole  10 mg Per Tube q morning   benztropine  1 mg Per Tube BID   Chlorhexidine Gluconate Cloth  6 each Topical Q0600   feeding supplement (PROSource TF)  45 mL Per Tube BID   insulin aspart  0-9 Units Subcutaneous Q4H   liothyronine  10 mcg Per Tube Daily   nutrition supplement (JUVEN)  1 packet Per Tube BID BM   rivaroxaban  20 mg Oral Q supper   senna-docusate  2 tablet Oral BID   tamsulosin  0.4 mg Oral Daily    ceFEPime (MAXIPIME) IV Stopped (05/31/22 0538)   feeding supplement (VITAL 1.5 CAL) 1,000 mL (05/30/22 0735)   metronidazole Stopped (05/31/22 0000)   vancomycin Stopped (05/30/22 1944)    Physical Exam: Blood pressure 125/83, pulse 100, temperature 99.3 F (37.4 C), temperature source Oral, resp. rate 18, height 6' (1.829 m), weight 101.8 kg, SpO2 99 %.    Chronically ill black mlae Lungs clear SEM AV sclerosis PEG tube in place Condom catheter LE edema with venous stasis and hard to  Palpate pedal pusles  Stage 4 sacral decubitus   Labs:   Lab Results  Component Value Date   WBC 7.8 05/31/2022   HGB 10.1 (L) 05/31/2022   HCT 32.8 (L) 05/31/2022   MCV 89.1 05/31/2022   PLT 275 05/31/2022    Recent Labs  Lab 05/31/22 0609  NA 134*  K 4.2  CL 109  CO2 21*  BUN 34*  CREATININE 0.73  CALCIUM 9.1  PROT 6.5  BILITOT 0.5  ALKPHOS 52  ALT 38  AST 19  GLUCOSE 157*   Lab Results  Component Value Date   CKTOTAL 29 (L) 04/19/2022   CKMB 1.8 03/07/2011   TROPONINI 0.28 (HH) 06/18/2018    Lab Results  Component Value Date   CHOL 128 10/08/2020   CHOL 108 12/28/2015   CHOL  03/08/2011    99        ATP  III CLASSIFICATION:  <200     mg/dL   Desirable  200-239  mg/dL   Borderline High  >=240    mg/dL   High          Lab Results  Component Value Date   HDL 33 (L) 10/08/2020   HDL 33 (L) 12/28/2015   HDL 34 (L) 03/08/2011   Lab Results  Component Value Date   LDLCALC 81 10/08/2020   LDLCALC 63 12/28/2015   LDLCALC  03/08/2011    53        Total Cholesterol/HDL:CHD Risk Coronary Heart Disease Risk Table                     Men   Women  1/2 Average Risk   3.4   3.3  Average Risk       5.0   4.4  2 X Average Risk   9.6   7.1  3 X Average Risk  23.4   11.0        Use the calculated Patient Ratio above and the CHD Risk Table to determine the patient's CHD Risk.        ATP III CLASSIFICATION (LDL):  <100     mg/dL   Optimal  100-129  mg/dL   Near or Above                    Optimal  130-159  mg/dL   Borderline  160-189  mg/dL   High  >190     mg/dL   Very High   Lab Results  Component Value Date   TRIG 72 10/08/2020   TRIG 62 12/28/2015   TRIG 62 03/08/2011   Lab Results  Component Value Date   CHOLHDL 3.9 10/08/2020   CHOLHDL 3.3 12/28/2015   CHOLHDL 2.9 03/08/2011   No results found for: "LDLDIRECT"    Radiology: ECHOCARDIOGRAM LIMITED  Result Date:  05/29/2022    ECHOCARDIOGRAM LIMITED REPORT   Patient Name:   Christian [name] Date of Exam: 05/29/2022 Medical Rec #:  [id number]        Height:       72.0 in Accession #:    [id number]       Weight:       224.4 lb Date of Birth:  1951/01/03        BSA:          2.238 m Patient Age:    64 years         BP:           100/65 mmHg Patient Gender: M                HR:           96 bpm. Exam Location:  [facility] Procedure: Limited Echo, Intracardiac Opacification Agent, Cardiac Doppler and            Color Doppler Indications:    Bacteremia  History:        Patient has prior history of Echocardiogram examinations, most                 recent 04/21/2022. Previous Myocardial Infarction,                 Signs/Symptoms:Edema, Syncope,  Altered Mental Status and Fever;                 Risk Factors:Sleep Apnea, Diabetes and Hypertension. Shock.  Sonographer:    Roseanna Rainbow RDCS Referring Phys: [id number] [place]  Sonographer Comments: Technically difficult study due to poor echo windows. Difficult images. Tube feeding dressing in subcostal region. Very low parastenal window. Definity attempted from two different IV sites. Suboptimal Definity visualization. IMPRESSIONS  1. Limited study. Definity images are not adequate.  2. Left ventricular ejection fraction, by estimation, is 45 to 50%. The left ventricle has mildly decreased function. The left ventricle demonstrates regional wall motion abnormalities (see scoring diagram/findings for description). There is mild left ventricular hypertrophy.  3. The mitral valve is degenerative. Trivial mitral valve regurgitation. Moderate mitral annular calcification.  4. The aortic valve was not well visualized. Aortic valve regurgitation is not visualized. Aortic valve sclerosis/calcification is present, without any evidence of aortic stenosis.  5. Tricuspid regurgitation signal is inadequate for assessing PA pressure.  6. The inferior vena cava is normal in size with greater than 50% respiratory variability, suggesting right atrial pressure of 3 mmHg. Comparison(s): Prior images reviewed side by side. LVEF and wall motion abnormalities similar to study from May 16. FINDINGS  Left Ventricle: Left ventricular ejection fraction, by estimation, is 45 to 50%. The left ventricle has mildly decreased function. The left ventricle demonstrates regional wall motion abnormalities. Definity contrast agent was given IV to delineate the left ventricular endocardial borders. The left ventricular internal cavity size was normal in size. There is mild left ventricular hypertrophy.  LV Wall Scoring: The mid and distal anterior septum, entire apex, and mid inferoseptal segment are hypokinetic. The anterior wall,  antero-lateral wall, inferior wall, posterior wall, basal anteroseptal segment, and basal inferoseptal segment are normal. Right Ventricle: Tricuspid regurgitation signal is inadequate for assessing PA pressure. Pericardium: Trivial pericardial effusion is present. The pericardial effusion is anterior to the right ventricle. Mitral Valve: The mitral valve is degenerative in appearance. Moderate mitral annular calcification. Trivial mitral valve regurgitation. Tricuspid Valve: The tricuspid valve is grossly normal. Tricuspid valve regurgitation is  trivial. Aortic Valve: The aortic valve was not well visualized. Aortic valve regurgitation is not visualized. Aortic valve sclerosis/calcification is present, without any evidence of aortic stenosis. Aorta: The aortic root is normal in size and structure. Venous: The inferior vena cava is normal in size with greater than 50% respiratory variability, suggesting right atrial pressure of 3 mmHg. LEFT VENTRICLE PLAX 2D LVIDd:         5.50 cm LVIDs:         4.10 cm LV PW:         1.20 cm LV IVS:        1.10 cm LVOT diam:     2.30 cm LVOT Area:     4.15 cm  LEFT ATRIUM         Index LA diam:    3.70 cm 1.65 cm/m   AORTA Ao Root diam: 3.20 cm  SHUNTS Systemic Diam: 2.30 cm Rozann Lesches MD Electronically signed by Rozann Lesches MD Signature Date/Time: 05/29/2022/3:39:38 PM    Final    CT HEAD WO CONTRAST (5MM)  Result Date: 05/28/2022 CLINICAL DATA:  Altered mental status EXAM: CT HEAD WITHOUT CONTRAST TECHNIQUE: Contiguous axial images were obtained from the base of the skull through the vertex without intravenous contrast. RADIATION DOSE REDUCTION: This exam was performed according to the departmental dose-optimization program which includes automated exposure control, adjustment of the mA and/or kV according to patient size and/or use of iterative reconstruction technique. COMPARISON:  Prior head CT 04/19/2022 FINDINGS: Brain: No evidence of acute infarction,  hemorrhage, hydrocephalus, extra-axial collection or mass lesion/mass effect. Stable mild atrophy. Vascular: No hyperdense vessel or unexpected calcification. Skull: Normal. Negative for fracture or focal lesion. Sinuses/Orbits: No acute finding. Other: Extensive punctate dermal calcifications. IMPRESSION: 1. No acute intracranial abnormality. Electronically Signed   By: Jacqulynn Cadet M.D.   On: 05/28/2022 08:42   CT Angio Chest PE W and/or Wo Contrast  Result Date: 05/27/2022 CLINICAL DATA:  Persistent tachycardia. EXAM: CT ANGIOGRAPHY CHEST WITH CONTRAST TECHNIQUE: Multidetector CT imaging of the chest was performed using the standard protocol during bolus administration of intravenous contrast. Multiplanar CT image reconstructions and MIPs were obtained to evaluate the vascular anatomy. RADIATION DOSE REDUCTION: This exam was performed according to the departmental dose-optimization program which includes automated exposure control, adjustment of the mA and/or kV according to patient size and/or use of iterative reconstruction technique. CONTRAST:  6m OMNIPAQUE IOHEXOL 350 MG/ML SOLN COMPARISON:  Apr 19, 2022 FINDINGS: Cardiovascular: Enlarged heart. Heavy calcific atherosclerotic disease of the coronary arteries. Mediastinum/Nodes: No enlarged mediastinal, hilar, or axillary lymph nodes. Thyroid gland, trachea, and esophagus demonstrate no significant findings. Lungs/Pleura: Scarring versus atelectasis in bilateral lung bases. Biapical pleural thickening, left greater than right. Areas of ground-glass opacities in the left lung apex with associated mild architectural distortion. Upper Abdomen: No acute abnormality. Musculoskeletal: No chest wall abnormality. No acute or significant osseous findings. Review of the MIP images confirms the above findings. IMPRESSION: 1. No evidence of pulmonary embolism. 2. Enlarged heart with heavy calcific atherosclerotic disease of the coronary arteries. 3. Scarring  versus atelectasis in bilateral lung bases. 4. Areas of ground-glass opacities in the left lung apex with associated mild architectural distortion. This finding has stable since 2019, which supports scarring. 5. Biapical pleural thickening, left greater than right. Electronically Signed   By: DFidela SalisburyM.D.   On: 05/27/2022 17:28   CT ABDOMEN PELVIS W CONTRAST  Result Date: 05/27/2022 CLINICAL DATA:  Abdominal pain.  Sacral  ulcer.  Fever. EXAM: CT ABDOMEN AND PELVIS WITH CONTRAST TECHNIQUE: Multidetector CT imaging of the abdomen and pelvis was performed using the standard protocol following bolus administration of intravenous contrast. RADIATION DOSE REDUCTION: This exam was performed according to the departmental dose-optimization program which includes automated exposure control, adjustment of the mA and/or kV according to patient size and/or use of iterative reconstruction technique. CONTRAST:  81m OMNIPAQUE IOHEXOL 350 MG/ML SOLN COMPARISON:  CT abdomen and pelvis 04/13/2022 FINDINGS: Lower chest: There is some atelectasis in the lung bases. Hepatobiliary: No focal liver abnormality is seen. No gallstones, gallbladder wall thickening, or biliary dilatation. Pancreas: Unremarkable. No pancreatic ductal dilatation or surrounding inflammatory changes. Spleen: Normal in size without focal abnormality. Adrenals/Urinary Tract: Again seen is an extrarenal pelvis on the left. There is no hydronephrosis or perinephric fluid. The adrenal glands are within normal limits. The bladder is decompressed by Foley catheter. Stomach/Bowel: Rectal wall thickening with surrounding inflammation and presacral edema appears unchanged. There is no evidence for bowel obstruction or free air. There is a large amount of stool throughout the colon. Appendix is within normal limits. Small bowel is within normal limits. Percutaneous gastrostomy tube in place, new from prior. Stomach is nondilated. Vascular/Lymphatic: There are  severe atherosclerotic calcifications of the aorta. Peripheral vascular calcifications are also seen. No evidence for aortic aneurysm. No enlarged lymph nodes are identified. Reproductive: Prostate is unremarkable. Other: No ascites or focal abdominal wall hernia. Musculoskeletal: Sacral decubitus ulcer is again seen overlying the distal sacrum and coccyx. Previously identified fluid collection is no longer seen. The ulcer abuts the tip of the coccyx and distal sacrum with some questionable mild underlying erosive change of the coccyx. IMPRESSION: 1. Sacral decubitus ulcer again seen extending to the level of the distal sacrum and coccyx. There are erosive changes of the coccyx compatible with osteomyelitis. 2. Rectal wall thickening with surrounding inflammation persists concerning for proctitis. 3. Foley catheter in place. 4. New gastrostomy tube present. 5. Severe atherosclerotic disease. Aortic Atherosclerosis (ICD10-I70.0). Electronically Signed   By: ARonney AstersM.D.   On: 05/27/2022 17:25   DG Chest Port 1 View  Result Date: 05/27/2022 CLINICAL DATA:  Provided history: Fever, sepsis protocol. EXAM: PORTABLE CHEST 1 VIEW COMPARISON:  Prior chest radiographs 05/19/2022 and earlier. FINDINGS: Shallow inspiration radiograph. Heart size within normal limits. Aortic atherosclerosis. No appreciable airspace consolidation or pulmonary edema. No evidence of pleural effusion or pneumothorax. No acute bony abnormality identified. Degenerative changes of the spine and left glenohumeral joint. IMPRESSION: Shallow inspiration radiograph. No evidence of acute cardiopulmonary abnormality. Aortic Atherosclerosis (ICD10-I70.0). Electronically Signed   By: KKellie SimmeringD.O.   On: 05/27/2022 12:17   DG Chest Port 1 View  Result Date: 05/19/2022 CLINICAL DATA:  Shortness of breath EXAM: PORTABLE CHEST 1 VIEW COMPARISON:  04/18/2022 FINDINGS: Low lung volumes. No consolidation or edema. No pleural effusion or  pneumothorax. Cardiomediastinal contours are within normal limits. IMPRESSION: No acute process in the chest. Electronically Signed   By: PMacy MisM.D.   On: 05/19/2022 08:36   IR GASTROSTOMY TUBE MOD SED  Result Date: 05/17/2022 INDICATION: Dysphagia EXAM: [place]:  05/17/2022 05/17/2022 2:57 pm Radiologist:  M. TDaryll Brod MD Guidance:  Ultrasound and fluoroscopic MEDICATIONS: 2 G ANCEF; Antibiotics were administered within 1 hour of the procedure. Glucagon 0.5 mg IV ANESTHESIA/SEDATION: Versed 1.5 mg IV; Fentanyl 50 mcg IV Moderate Sedation Time:  16 minutes The patient was continuously monitored during the procedure by the  interventional radiology nurse under my direct supervision. CONTRAST:  19m OMNIPAQUE IOHEXOL 300 MG/ML SOLN - administered into the gastric lumen. FLUOROSCOPY: Fluoroscopy Time: 4 minutes 0 seconds (50 mGy). COMPLICATIONS: None immediate. PROCEDURE: Informed consent was obtained from the patient following explanation of the procedure, risks, benefits and alternatives. The patient understands, agrees and consents for the procedure. All questions were addressed. A time out was performed. Maximal barrier sterile technique utilized including caps, mask, sterile gowns, sterile gloves, large sterile drape, hand hygiene, and betadine prep. The left upper quadrant was sterilely prepped and draped. An oral gastric catheter was inserted into the stomach under fluoroscopy. The existing nasogastric feeding tube was removed. Air was injected into the stomach for insufflation and visualization under fluoroscopy. The air distended stomach was confirmed beneath the anterior abdominal wall in the frontal and lateral projections. Under sterile conditions and local anesthesia, a 150gauge trocar needle was utilized to access the stomach percutaneously beneath the left subcostal margin. Needle position was confirmed within the stomach under biplane fluoroscopy. Contrast  injection confirmed position also. A single T tack was deployed for gastropexy. Over an Amplatz guide wire, a 9-French sheath was inserted into the stomach. A snare device was utilized to capture the oral gastric catheter. The snare device was pulled retrograde from the stomach up the esophagus and out the oropharynx. The 20-French pull-through gastrostomy was connected to the snare device and pulled antegrade through the oropharynx down the esophagus into the stomach and then through the percutaneous tract external to the patient. The gastrostomy was assembled externally. Contrast injection confirms position in the stomach. Images were obtained for documentation. The patient tolerated procedure well. No immediate complication. IMPRESSION: Fluoroscopic insertion of a 20-French "pull-through" gastrostomy. Electronically Signed   By: MJerilynn Mages  Shick M.D.   On: 05/17/2022 15:49   DG Abd Portable 1V  Result Date: 05/03/2022 CLINICAL DATA:  Feeding tube placement EXAM: PORTABLE ABDOMEN - 1 VIEW COMPARISON:  04/13/2022 FINDINGS: Limited radiograph of the upper abdomen was obtained for the purposes of enteric tube localization. Enteric tube is seen coursing below the diaphragm with distal tip terminating within the expected location of the gastric body. IMPRESSION: Enteric tube tip terminating within the gastric body. Electronically Signed   By: NDavina PokeD.O.   On: 05/03/2022 16:30    EKG: ST LAD IVCD poor R wave progression 05/28/22   ASSESSMENT AND PLAN:   Bacteremia:  strep constellatus.  In setting of known stage 4 sacral decubitus with osteomyelitis. Multiple recent TTE/TEE with no vegetations Increased risk of aspiration with poor gag reflex/swallowing issues currently with PEG tube. Duration of antibiotics will depend on recurrent fevers, clearing of BC;s and healing of sacral decubitus TEE will not alter this and given multiple recent imaging of heart yield for SBE extremely low Even if vegetation found  he is not a surgical candidate and would not likely change course of prolonged antibiotics for osteomyelitis   Signed: PJenkins Rouge7/01/2022, 8:30 AM

## 2022-05-31 NOTE — Progress Notes (Signed)
Pharmacy Antibiotic Note  Christian [name] is a 71 y.o. male admitted on 05/27/2022 with a sacral decubitus ulcer with osteo. Pharmacy consulted to transition to Zosyn.   Plan: - Start Zosyn 3.375g IV every 8 hours (infused over 4 hours) - Will continue to follow renal function, culture results, LOT, and antibiotic de-escalation plans   Height: 6' (182.9 cm) Weight: 101.8 kg (224 lb 6.9 oz) IBW/kg (Calculated) : 77.6  Temp (24hrs), Avg:98.9 F (37.2 C), Min:98.5 F (36.9 C), Max:99.3 F (37.4 C)  Recent Labs  Lab 05/27/22 1332 05/27/22 1543 05/28/22 0556 05/29/22 0551 05/30/22 0551 05/31/22 0609  WBC 8.9  --  10.8* 5.3 6.8 7.8  CREATININE 1.38*  --  1.17 0.94 0.86 0.73  LATICACIDVEN 1.5 1.1  --   --   --   --     Estimated Creatinine Clearance: 109.1 mL/min (by C-G formula based on SCr of 0.73 mg/dL).    Allergies  Allergen Reactions   Amlodipine Swelling and Other (See Comments)    Legs became swollen   Onglyza [Saxagliptin] Other (See Comments)    "Allergic," per [facility]   Zyprexa [Olanzapine] Other (See Comments)    Allergy noted by ACT Team. No reaction specified (??)- "Allergic," per [facility]   Ritalin [Methylphenidate] Anxiety and Other (See Comments)    "Maniac"    Thank you for allowing pharmacy to be a part of this patient's care.  Georgina Pillion, PharmD, BCPS Infectious Diseases Clinical Pharmacist 05/31/2022 2:08 PM   **Pharmacist phone directory can now be found on [email or web address] (PW [email or web address]).  Listed under [facility].

## 2022-05-31 NOTE — Care Management Important Message (Signed)
Important Message  Patient Details  Name: Christian [name] MRN: [id number] Date of Birth: [birth year]   Medicare Important Message Given:  Yes     [name] 05/31/2022, 11:47 AM

## 2022-05-31 NOTE — Progress Notes (Signed)
PROGRESS NOTE    Christian [name]  ERX:[id number] DOB: 21-Feb-1951 DOA: 05/27/2022 PCP: [name], MD   Brief Narrative:    Christian [name] is a 71 y.o. male with medical history significant for diabetes mellitus, CVA, bipolar disorder, coronary artery disease, PE and DVT, CKD 3, atrial fibrillation, OSA, gastrostomy tube and chronic Foley catheter. Patient reports to the ED from nursing home reports of altered mental status, increasing lethargy and fever, with tachycardia heart rate in the 130s.  Patient was admitted with concerns of severe sepsis in the setting of sacral decubitus ulcer which is now demonstrating findings of new osteomyelitis.  He is now noted to have strep bacteremia and ID is following.  Assessment & Plan:   Principal Problem:   Severe sepsis (HCC) Active Problems:   Urinary tract infection associated with catheterization of urinary tract (HCC)   Encephalopathy   Sacral decubitus ulcer, stage IV (HCC)   Chronic diastolic CHF (congestive heart failure) (HCC)   Hypertension   Type 2 diabetes mellitus with complication, without long-term current use of insulin (HCC)   OSA on CPAP   Diabetes mellitus type 2 in nonobese (HCC)   Pulmonary embolism (HCC)   Osteomyelitis (HCC)  Assessment and Plan:   Severe sepsis with strep bacteremia (HCC) Meeting sepsis criteria with fever of 103, tachycardia heart rates up to 140s, tachypnea to 36.  With evidence of endorgan dysfunction, and acute metabolic encephalopathy and possibly AKI.  WBC 8.9.  Source of infection likely sacral decubitus ulcer which is showing new osteomyelitis not present from prior imaging, and possible catheter associated UTI.  No suggestion of infection in the chest or abdomen. -Strep constellatus bacteremia noted and patient has been changed to IV Zosyn per ID -No need for TEE per cardiology -PICC line to be placed today with final recommendations per ID forthcoming    Sacral decubitus ulcer, stage  IV (HCC) Large stage IV sacral decubitus ulcer.  Patient nonambulatory.  Poor functional status and debility after 2 prolonged recent hospitalizations in April and then May to June.  CT suggesting osteomyelitis of the coccyx.  No abscess. -Appreciate debridement and possible bone biopsy with general surgery 7/1, cultures pending   Encephalopathy-resolved Acute metabolic encephalopathy likely secondary to severe sepsis. -CT head with no acute findings -Resume home psychoactive medications      Urinary tract infection associated with catheterization of urinary tract (HCC) -Although this was suspected on admission, seems less likely with no growth in urine culture -UA also collected from old Foley, which has not been exchanged   Pulmonary embolism (HCC) Resumed home Xarelto 7/2   Type 2 diabetes mellitus with complication, without long-term current use of insulin (HCC) Controlled, A1c 7.6.  Glucose 272.   -Hold home long-acting insulin 15 units twice daily - SSI- S     Hypertension Stable. -Hold metoprolol 50 mg daily while n.p.o. and until blood pressures further improved -As needed labetalol for systolic greater than 170     DVT prophylaxis: Xarelto Code Status: Full Family Communication: None at bedside Disposition Plan:  Status is: Inpatient Remains inpatient appropriate because: IV medications     Skin Assessment:   I have examined the patient's skin and I agree with the wound assessment as performed by the wound care RN as outlined below:          Pressure Injury 03/31/22 Sacrum Mid Stage 4 - Full thickness tissue loss with exposed bone, tendon or muscle. (Active)  03/31/22 1933  Location:  Sacrum  Location Orientation: Mid  Staging: Stage 4 - Full thickness tissue loss with exposed bone, tendon or muscle.  Wound Description (Comments):   Present on Admission: Yes  Dressing Type Gauze (Comment) 05/28/22 0000     Pressure Injury 05/08/22 Heel Right Deep Tissue  Pressure Injury - Purple or maroon localized area of discolored intact skin or blood-filled blister due to damage of underlying soft tissue from pressure and/or shear. (Active)  05/08/22 1600  Location: Heel  Location Orientation: Right  Staging: Deep Tissue Pressure Injury - Purple or maroon localized area of discolored intact skin or blood-filled blister due to damage of underlying soft tissue from pressure and/or shear.  Wound Description (Comments):   Present on Admission: No  Dressing Type None 05/28/22 0000      Consultants:  Gen Surg Palliative care ID Cardiology   Procedures:  Bone biopsy to decubitus ulcer on 7/1   Antimicrobials:  Anti-infectives (From admission, onward)    Start     Dose/Rate Route Frequency Ordered Stop   05/31/22 1500  piperacillin-tazobactam (ZOSYN) IVPB 3.375 g        3.375 g 12.5 mL/hr over 240 Minutes Intravenous Every 8 hours 05/31/22 1409     05/28/22 1800  vancomycin (VANCOREADY) IVPB 1750 mg/350 mL  Status:  Discontinued        1,750 mg 175 mL/hr over 120 Minutes Intravenous Every 24 hours 05/27/22 1753 05/31/22 1400   05/27/22 2345  metroNIDAZOLE (FLAGYL) IVPB 500 mg  Status:  Discontinued        500 mg 100 mL/hr over 60 Minutes Intravenous Every 12 hours 05/27/22 2337 05/31/22 1400   05/27/22 2200  ceFEPIme (MAXIPIME) 2 g in sodium chloride 0.9 % 100 mL IVPB  Status:  Discontinued        2 g 200 mL/hr over 30 Minutes Intravenous Every 8 hours 05/27/22 1750 05/31/22 1400   05/27/22 1800  vancomycin (VANCOREADY) IVPB 2000 mg/400 mL        2,000 mg 200 mL/hr over 120 Minutes Intravenous  Once 05/27/22 1750 05/27/22 1930   05/27/22 1700  cefTRIAXone (ROCEPHIN) 1 g in sodium chloride 0.9 % 100 mL IVPB        1 g 200 mL/hr over 30 Minutes Intravenous  Once 05/27/22 1651 05/27/22 1812      Subjective: Patient seen and evaluated today with no new acute complaints or concerns. No acute concerns or events noted  overnight.  Objective: Vitals:   05/30/22 1453 05/30/22 2113 05/31/22 0513 05/31/22 1239  BP: 117/78 140/77 125/83 113/63  Pulse: 93 96 100 92  Resp: 20 19 18 18   Temp: 99.1 F (37.3 C) 98.6 F (37 C) 99.3 F (37.4 C) 98.5 F (36.9 C)  TempSrc: Oral  Oral Oral  SpO2: 99% 100% 99% 99%  Weight:      Height:        Intake/Output Summary (Last 24 hours) at 05/31/2022 1546 Last data filed at 05/31/2022 1525 Gross per 24 hour  Intake 4598.59 ml  Output 2700 ml  Net 1898.59 ml   Filed Weights   05/27/22 1139 05/27/22 2300 05/29/22 1000  Weight: 136 kg 95.7 kg 101.8 kg    Examination:  General exam: Appears calm and comfortable, somnolent Respiratory system: Clear to auscultation. Respiratory effort normal. Cardiovascular system: S1 & S2 heard, RRR.  Gastrointestinal system: Abdomen is soft, G-tube C/D/I Central nervous system: Alert and awake Extremities: No edema Skin: Decubitus ulcer changes as noted previously Psychiatry: Flat  affect.    Data Reviewed: I have personally reviewed following labs and imaging studies  CBC: Recent Labs  Lab 05/27/22 1332 05/28/22 0556 05/29/22 0551 05/30/22 0551 05/31/22 0609  WBC 8.9 10.8* 5.3 6.8 7.8  NEUTROABS 7.6  --   --   --   --   HGB 13.5 13.1 11.0* 11.0* 10.1*  HCT 44.0 42.4 35.9* 36.3* 32.8*  MCV 88.2 89.3 90.0 90.1 89.1  PLT 399 336 339 300 275   Basic Metabolic Panel: Recent Labs  Lab 05/27/22 1332 05/28/22 0556 05/29/22 0551 05/30/22 0551 05/31/22 0609  NA 132* 137 138 141 134*  K 5.4* 4.8 4.1 4.5 4.2  CL 99 106 113* 111 109  CO2 25 25 20* 25 21*  GLUCOSE 272* 167* 249* 206* 157*  BUN 69* 55* 48* 41* 34*  CREATININE 1.38* 1.17 0.94 0.86 0.73  CALCIUM 9.5 9.3 9.0 9.6 9.1  MG  --   --  2.2 1.9 1.7   GFR: Estimated Creatinine Clearance: 109.1 mL/min (by C-G formula based on SCr of 0.73 mg/dL). Liver Function Tests: Recent Labs  Lab 05/27/22 1332 05/28/22 0556 05/29/22 0551 05/30/22 0551  05/31/22 0609  AST 50* 109* 54* 25 19  ALT 72* 100* 74* 53* 38  ALKPHOS 80 71 63 61 52  BILITOT 0.5 0.6 0.5 0.5 0.5  PROT 8.2* 7.5 6.8 7.1 6.5  ALBUMIN 2.6* 2.2* 2.0* 2.1* 2.0*   No results for input(s): "LIPASE", "AMYLASE" in the last 168 hours. No results for input(s): "AMMONIA" in the last 168 hours. Coagulation Profile: Recent Labs  Lab 05/27/22 1332  INR 1.4*   Cardiac Enzymes: No results for input(s): "CKTOTAL", "CKMB", "CKMBINDEX", "TROPONINI" in the last 168 hours. BNP (last 3 results) No results for input(s): "PROBNP" in the last 8760 hours. HbA1C: No results for input(s): "HGBA1C" in the last 72 hours. CBG: Recent Labs  Lab 05/30/22 2016 05/31/22 0104 05/31/22 0359 05/31/22 0703 05/31/22 1109  GLUCAP 289* 260* 168* 145* 161*   Lipid Profile: No results for input(s): "CHOL", "HDL", "LDLCALC", "TRIG", "CHOLHDL", "LDLDIRECT" in the last 72 hours. Thyroid Function Tests: No results for input(s): "TSH", "T4TOTAL", "FREET4", "T3FREE", "THYROIDAB" in the last 72 hours. Anemia Panel: No results for input(s): "VITAMINB12", "FOLATE", "FERRITIN", "TIBC", "IRON", "RETICCTPCT" in the last 72 hours. Sepsis Labs: Recent Labs  Lab 05/27/22 1332 05/27/22 1543  LATICACIDVEN 1.5 1.1    Recent Results (from the past 240 hour(s))  Culture, blood (Routine x 2)     Status: None (Preliminary result)   Collection Time: 05/27/22 11:52 AM   Specimen: BLOOD  Result Value Ref Range Status   Specimen Description BLOOD BLOOD RIGHT FOREARM  Final   Special Requests   Final    BOTTLES DRAWN AEROBIC AND ANAEROBIC Blood Culture adequate volume   Culture   Final    NO GROWTH 4 DAYS Performed at [facility], [address], [place], [place] [address]    Report Status PENDING  Incomplete  Culture, blood (Routine x 2)     Status: Abnormal   Collection Time: 05/27/22  1:33 PM   Specimen: BLOOD  Result Value Ref Range Status   Specimen Description   Final    BLOOD LEFT  ANTECUBITAL Performed at [facility], [address] [place], [place] [address]    Special Requests   Final    BOTTLES DRAWN AEROBIC AND ANAEROBIC Blood Culture adequate volume Performed at [facility], [address], [place], [place] [address]  Culture  Setup Time   Final    GRAM POSITIVE COCCI IN BOTH AEROBIC AND ANAEROBIC BOTTLES Gram Stain Report Called to,Read Back By and Verified With: SULY VILLALOBOF AT 1329 BY SA ON 05/28/22 CRITICAL RESULT CALLED TO, READ BACK BY AND VERIFIED WITH: RN Tillman Sers [id number] @1931  FH Performed at [facility], [address], [place], [place]    Culture STREPTOCOCCUS CONSTELLATUS (A)  Final   Report Status 05/31/2022 FINAL  Final   Organism ID, Bacteria STREPTOCOCCUS CONSTELLATUS  Final      Susceptibility   Streptococcus constellatus - MIC*    PENICILLIN 0.12 SENSITIVE Sensitive     CEFTRIAXONE 0.5 SENSITIVE Sensitive     ERYTHROMYCIN <=0.12 SENSITIVE Sensitive     LEVOFLOXACIN 0.5 SENSITIVE Sensitive     VANCOMYCIN 0.5 SENSITIVE Sensitive     * STREPTOCOCCUS CONSTELLATUS  Blood Culture ID Panel (Reflexed)     Status: Abnormal   Collection Time: 05/27/22  1:33 PM  Result Value Ref Range Status   Enterococcus faecalis NOT DETECTED NOT DETECTED Final   Enterococcus Faecium NOT DETECTED NOT DETECTED Final   Listeria monocytogenes NOT DETECTED NOT DETECTED Final   Staphylococcus species NOT DETECTED NOT DETECTED Final   Staphylococcus aureus (BCID) NOT DETECTED NOT DETECTED Final   Staphylococcus epidermidis NOT DETECTED NOT DETECTED Final   Staphylococcus lugdunensis NOT DETECTED NOT DETECTED Final   Streptococcus species DETECTED (A) NOT DETECTED Final    Comment: Not Enterococcus species, Streptococcus agalactiae, Streptococcus pyogenes, or Streptococcus pneumoniae. CRITICAL RESULT CALLED TO, READ BACK BY AND VERIFIED WITH: RN N. HEARP [phone] @1931  FH    Streptococcus agalactiae NOT DETECTED NOT DETECTED Final    Streptococcus pneumoniae NOT DETECTED NOT DETECTED Final   Streptococcus pyogenes NOT DETECTED NOT DETECTED Final   A.calcoaceticus-baumannii NOT DETECTED NOT DETECTED Final   Bacteroides fragilis NOT DETECTED NOT DETECTED Final   Enterobacterales NOT DETECTED NOT DETECTED Final   Enterobacter cloacae complex NOT DETECTED NOT DETECTED Final   Escherichia coli NOT DETECTED NOT DETECTED Final   Klebsiella aerogenes NOT DETECTED NOT DETECTED Final   Klebsiella oxytoca NOT DETECTED NOT DETECTED Final   Klebsiella pneumoniae NOT DETECTED NOT DETECTED Final   Proteus species NOT DETECTED NOT DETECTED Final   Salmonella species NOT DETECTED NOT DETECTED Final   Serratia marcescens NOT DETECTED NOT DETECTED Final   Haemophilus influenzae NOT DETECTED NOT DETECTED Final   Neisseria meningitidis NOT DETECTED NOT DETECTED Final   Pseudomonas aeruginosa NOT DETECTED NOT DETECTED Final   Stenotrophomonas maltophilia NOT DETECTED NOT DETECTED Final   Candida albicans NOT DETECTED NOT DETECTED Final   Candida auris NOT DETECTED NOT DETECTED Final   Candida glabrata NOT DETECTED NOT DETECTED Final   Candida krusei NOT DETECTED NOT DETECTED Final   Candida parapsilosis NOT DETECTED NOT DETECTED Final   Candida tropicalis NOT DETECTED NOT DETECTED Final   Cryptococcus neoformans/gattii NOT DETECTED NOT DETECTED Final    Comment: Performed at [facility], [address], [place], [address] [place]  Remove and replace urinary cath (placed > 5 days) then obtain urine culture from new indwelling urinary catheter.     Status: None   Collection Time: 05/28/22  1:40 AM   Specimen: Urine, Catheterized  Result Value Ref Range Status   Specimen Description   Final    URINE, CATHETERIZED Performed at [facility], [address], [place], [address] [place]    Special Requests   Final  NONE Performed at [facility], [address], [place], [place] [address]    Culture   Final    NO GROWTH Performed  at [facility], [address], [place], [place] [address]    Report Status 05/29/2022 FINAL  Final  Culture, blood (Routine X 2) w Reflex to ID Panel     Status: None (Preliminary result)   Collection Time: 05/29/22  5:50 AM   Specimen: Right Antecubital; Blood  Result Value Ref Range Status   Specimen Description RIGHT ANTECUBITAL BOTTLES DRAWN AEROBIC ONLY  Final   Special Requests Blood Culture adequate volume  Final   Culture   Final    NO GROWTH 2 DAYS Performed at [facility], [address] [place], [place] [address]    Report Status PENDING  Incomplete  Culture, blood (Routine X 2) w Reflex to ID Panel     Status: None (Preliminary result)   Collection Time: 05/29/22  6:04 AM   Specimen: BLOOD RIGHT HAND  Result Value Ref Range Status   Specimen Description BLOOD RIGHT HAND BOTTLES DRAWN AEROBIC ONLY  Final   Special Requests   Final    Blood Culture results may not be optimal due to [name] excessive volume of blood received in culture bottles Performed at [facility], [address], [place], [place] [address]    Culture PENDING  Incomplete   Report Status PENDING  Incomplete  Aerobic Culture w Gram Stain (superficial specimen)     Status: None (Preliminary result)   Collection Time: 05/29/22  1:13 PM   Specimen: Sacral  Result Value Ref Range Status   Specimen Description   Final    SACRAL Performed at [facility], [address], [place], [place] [address]    Special Requests   Final    BONE BIOPSY Performed at [facility], [address], [place], [place] [address]    Gram Stain   Final    NO SQUAMOUS EPITHELIAL CELLS SEEN FEW WBC SEEN MODERATE GRAM NEGATIVE RODS MODERATE GRAM POSITIVE COCCI FEW GRAM POSITIVE RODS    Culture   Final    FEW ENTEROCOCCUS FAECALIS RARE PROTEUS MIRABILIS SUSCEPTIBILITIES TO FOLLOW Performed at [facility], [address], [place], [place] [address]    Report Status PENDING  Incomplete         Radiology  Studies: [place] EKG SITE RITE  Result Date: 05/31/2022 If Site Rite image not attached, placement could not be confirmed due to current cardiac rhythm.       Scheduled Meds:  ARIPiprazole  10 mg Per Tube q morning   benztropine  1 mg Per Tube BID   Chlorhexidine Gluconate Cloth  6 each Topical Q0600   feeding supplement (PROSource TF)  45 mL Per Tube BID   insulin aspart  0-9 Units Subcutaneous Q4H   liothyronine  10 mcg Per Tube Daily   nutrition supplement (JUVEN)  1 packet Per Tube BID BM   rivaroxaban  20 mg Oral Q supper   senna-docusate  2 tablet Oral BID   tamsulosin  0.4 mg Oral Daily   Continuous Infusions:  feeding supplement (VITAL 1.5 CAL) 65 mL/hr at 05/31/22 1525   piperacillin-tazobactam (ZOSYN)  IV 12.5 mL/hr at 05/31/22 1525     LOS: 4 days    Time spent: 35 minutes    Myan Locatelli D Sherryll Burger, DO [facility]  If 7PM-7AM, please contact night-coverage [email or web address] 05/31/2022, 3:46 PM

## 2022-05-31 NOTE — Progress Notes (Signed)
Pharmacy Antibiotic Note  Christian [name] is a 71 y.o. male admitted on 05/27/2022 with a sacral decubitus ulcer with osteo. Pharmacy consulted to transition to Zosyn.  Add on fluconazole.  Plan: - Start Zosyn 3.375g IV every 8 hours (infused over 4 hours) Fluconazole 200 mg IV x 1 dose followed by 100 mg IV daily. - Will continue to follow renal function, culture results, LOT, and antibiotic de-escalation plans   Height: 6' (182.9 cm) Weight: 101.8 kg (224 lb 6.9 oz) IBW/kg (Calculated) : 77.6  Temp (24hrs), Avg:98.8 F (37.1 C), Min:98.5 F (36.9 C), Max:99.3 F (37.4 C)  Recent Labs  Lab 05/27/22 1332 05/27/22 1543 05/28/22 0556 05/29/22 0551 05/30/22 0551 05/31/22 0609  WBC 8.9  --  10.8* 5.3 6.8 7.8  CREATININE 1.38*  --  1.17 0.94 0.86 0.73  LATICACIDVEN 1.5 1.1  --   --   --   --      Estimated Creatinine Clearance: 109.1 mL/min (by C-G formula based on SCr of 0.73 mg/dL).    Allergies  Allergen Reactions   Amlodipine Swelling and Other (See Comments)    Legs became swollen   Onglyza [Saxagliptin] Other (See Comments)    "Allergic," per [facility]   Zyprexa [Olanzapine] Other (See Comments)    Allergy noted by ACT Team. No reaction specified (??)- "Allergic," per [facility]   Ritalin [Methylphenidate] Anxiety and Other (See Comments)    "Maniac"    Thank you for allowing pharmacy to be a part of this patient's care.  Georgina Pillion, PharmD, BCPS Infectious Diseases Clinical Pharmacist 05/31/2022 4:59 PM   **Pharmacist phone directory can now be found on [email or web address] ([email or web address]).  Listed under [facility].

## 2022-05-31 NOTE — Evaluation (Signed)
Clinical/Bedside Swallow Evaluation Patient Details  Name: Christian [name] MRN: [id number] Date of Birth: 15-Apr-1951  Today's Date: 05/31/2022 Time: SLP Start Time (ACUTE ONLY): 1537 SLP Stop Time (ACUTE ONLY): 1558 SLP Time Calculation (min) (ACUTE ONLY): 21 min  Past Medical History:  Past Medical History:  Diagnosis Date   Bilateral lower extremity edema: chronic with venous stasis changes 06/02/2014   Bipolar 1 disorder (HCC)    Bipolar disorder (HCC)    CHF (congestive heart failure) (HCC)    Chronic kidney disease    Gout    Gout    Hx of blood clots    Hypertension    Mood swings    OSA on CPAP 06/02/2014   Other and unspecified hyperlipidemia 06/02/2014   Type II or unspecified type diabetes mellitus with unspecified complication, uncontrolled 06/02/2014   Past Surgical History:  Past Surgical History:  Procedure Laterality Date   ABDOMINAL AORTOGRAM W/LOWER EXTREMITY N/A 09/16/2021   Procedure: ABDOMINAL AORTOGRAM W/LOWER EXTREMITY;  Surgeon: Victorino Sparrow, MD;  Location: [facility] INVASIVE CV LAB;  Service: Cardiovascular;  Laterality: N/A;   IR GASTROSTOMY TUBE MOD SED  05/17/2022   LEFT HEART CATH AND CORONARY ANGIOGRAPHY Left 04/01/2020   Procedure: LEFT HEART CATH AND CORONARY ANGIOGRAPHY;  Surgeon: Laurier Nancy, MD;  Location: [facility] INVASIVE CV LAB;  Service: Cardiovascular;  Laterality: Left;   PERIPHERAL VASCULAR BALLOON ANGIOPLASTY Left 09/16/2021   Procedure: PERIPHERAL VASCULAR BALLOON ANGIOPLASTY;  Surgeon: Victorino Sparrow, MD;  Location: [facility] INVASIVE CV LAB;  Service: Cardiovascular;  Laterality: Left;  attempted to PTA posterior tibial artery; unable to cross lesion   TEE WITHOUT CARDIOVERSION N/A 04/21/2022   Procedure: TRANSESOPHAGEAL ECHOCARDIOGRAM (TEE);  Surgeon: [name], MD;  Location: [facility] ENDOSCOPY;  Service: Cardiovascular;  Laterality: N/A;   HPI:  Christian [name] is a 71 y.o. male with medical history significant for diabetes mellitus, CVA,  bipolar disorder, coronary artery disease, PE and DVT, CKD 3, atrial fibrillation, OSA, gastrostomy tube and chronic Foley catheter. Patient reports to the ED from nursing home reports of altered mental status, increasing lethargy and fever, with tachycardia heart rate in the 130s.  Patient was admitted with concerns of severe sepsis in the setting of sacral decubitus ulcer which is now demonstrating findings of new osteomyelitis.  He is now noted to have strep bacteremia and ID is following.    Assessment / Plan / Recommendation  Clinical Impression  Clnical swallowing evaluation completed while Pt was sitting upright in bed; RN reports frequent coughing episodes with water, however, she further reports when she has provided trials and ensured upright positioning that he has not experienced any coughing. Pt is in a bariatric bed and was repositioned with help of LPN and even sitting all the way up positioning is sub-optimal. SLP then placed firm setting on bed which significantly improved positioning. After positioning was appropriate, SLP provided oral care and note white coating on lingual surface, question thrush. MD notified, depite thorough oral care, SLP was unable to clear coating from lingual surface. Pt was provided thin liquids and puree textures and note no overt s/sx of aspiration. Note impulsitivity with thin liquids when presented with a straw. Pt will drink the entire cup if the straw is not squeezed or removed to slow intake. Suspect reclined positioning and imuplsive thin intake has contributed to reported coughing/"choking" on water. Pt attempted a bite of regular/cracker, chewed and eventually expectorated bolus. Recommend initiate D1/puree diet with thin liquids with recommendations  re: ensure Pt is sitting upright and control amount of liquid via squeezing the straw after each swallow or two. Crush meds in puree. ST will continue to follow, thank you, SLP Visit Diagnosis: Dysphagia,  unspecified (R13.10)    Aspiration Risk  Mild aspiration risk;Moderate aspiration risk    Diet Recommendation Dysphagia 1 (Puree);Thin liquid   Liquid Administration via: Cup;Straw Medication Administration: Crushed with puree Supervision: Full supervision/cueing for compensatory strategies Compensations: Slow rate;Small sips/bites;Minimize environmental distractions Postural Changes: Seated upright at 90 degrees    Other  Recommendations Oral Care Recommendations: Oral care BID;Staff/trained caregiver to provide oral care    Recommendations for follow up therapy are one component of a multi-disciplinary discharge planning process, led by the attending physician.  Recommendations may be updated based on patient status, additional functional criteria and insurance authorization.     Assistance Recommended at Discharge None  Functional Status Assessment    Frequency and Duration min 2x/week  1 week       Prognosis Prognosis for Safe Diet Advancement: Fair Barriers to Reach Goals: Cognitive deficits      Swallow Study   General Date of Onset: 05/31/22 HPI: Christian [name] is a 71 y.o. male with medical history significant for diabetes mellitus, CVA, bipolar disorder, coronary artery disease, PE and DVT, CKD 3, atrial fibrillation, OSA, gastrostomy tube and chronic Foley catheter. Patient reports to the ED from nursing home reports of altered mental status, increasing lethargy and fever, with tachycardia heart rate in the 130s.  Patient was admitted with concerns of severe sepsis in the setting of sacral decubitus ulcer which is now demonstrating findings of new osteomyelitis.  He is now noted to have strep bacteremia and ID is following. Type of Study: Bedside Swallow Evaluation Previous Swallow Assessment: BSE in 2021 - WNL Diet Prior to this Study: NPO Temperature Spikes Noted: No Respiratory Status: Room air History of Recent Intubation: No Behavior/Cognition:  Alert;Cooperative Oral Cavity Assessment: Dry (white lingual coating) Oral Care Completed by SLP: Yes Oral Cavity - Dentition: Dentures, top;Missing dentition;Poor condition Vision: Impaired for self-feeding Self-Feeding Abilities: Needs assist;Needs set up Patient Positioning: Upright in bed Baseline Vocal Quality: Normal Volitional Cough: Cognitively unable to elicit Volitional Swallow: Unable to elicit    Oral/Motor/Sensory Function     Ice Chips     Thin Liquid Thin Liquid: Within functional limits    Nectar Thick Nectar Thick Liquid: Not tested   Honey Thick Honey Thick Liquid: Not tested   Puree Puree: Within functional limits   Solid     Solid: Impaired Presentation: Spoon Oral Phase Impairments: Impaired mastication;Poor awareness of bolus Oral Phase Functional Implications: Impaired mastication;Prolonged oral transit Pharyngeal Phase Impairments: Other (comments) (expectorated bolus/solid presentation)     Deanette Tullius H. Romie Levee, CCC-SLP Speech Language Pathologist  Georgetta Haber 05/31/2022,4:22 PM

## 2022-05-31 NOTE — Consult Note (Signed)
Consultation Note Date: 05/31/2022   Patient Name: Christian [name]  DOB: Sep 19, 1951  MRN: [id number]  Age / Sex: 71 y.o., male  PCP: [name], MD Referring Physician: Erick Blinks, DO  Reason for Consultation: Establishing goals of care  HPI/Patient Profile: 71 y.o. 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   Clinical Assessment and Goals of Care: I have reviewed medical records including EPIC notes, labs and imaging, received report from RN, assessed the patient.  Christian [name] is lying quietly in bed.  He appears acutely/chronically ill and frail.  He will briefly make an somewhat keep eye contact.  He is oriented to person and situation.  I believe that he can make his basic needs known.  There is no family present at bedside at this time.  He has a legal guardian, therefore goals of care discussions cannot be held with him.  Call to daughter/legal guardian, Christian [name] to discuss diagnosis prognosis, GOC, EOL wishes, disposition and options.  I introduced Palliative Medicine as specialized medical care for people living with serious illness. It focuses on providing relief from the symptoms and stress of a serious illness. The goal is to improve quality of life for both the patient and the family.  Christian [name] was seen multiple times by the inpatient palliative team during his last hospital stay.  We focused on their current illness.  We talk in detail about Christian [name]'s bacteremia and the treatment plan, IV antibiotics,  risk for recurrence, pharmacy and infectious disease following.  We also talk about Christian [name]'s wounds, and the treatment plan.  Christian [name] states that she last saw his wound on Thursday.  She shares her concerns that he would not be able to heal this wound.  The natural disease trajectory and expectations at EOL were discussed.  Discussed the importance of continued conversation with family and the medical providers regarding overall plan of care and treatment options, ensuring decisions are within the context of the patient's values and GOCs.  Questions and concerns were addressed.  Hard Choices booklet left for review. The family was encouraged to call with questions or concerns.  PMT will continue to support holistically.  Conference with attending, bedside nursing staff, transition of care team related to patient condition, needs, goals of care, disposition.   HCPOA LEGAL GUARDIAN -daughter, Christian [name].  Copy of legal guardian paperwork can be found under chart review/media dated 01/30/2019.    SUMMARY OF RECOMMENDATIONS   At this point continue full scope/full code Time for outcomes Anticipate return to long-term care at [facility] rehab  Code Status/Advance Care Planning: Full code  Symptom Management:  Per hospitalist, no additional needs  at this time.  Palliative Prophylaxis:  Frequent Pain Assessment, Oral Care, Palliative Wound Care, Turn Reposition, and air mattress  Additional Recommendations (Limitations, Scope, Preferences): Full Scope Treatment  Psycho-social/Spiritual:  Desire for further Chaplaincy support:yes Additional Recommendations: Caregiving  Support/Resources  Prognosis:  Unable to determine, based on outcomes.  Guarded.  3 to 6 months or less would not be surprising based on chronic illness burden, poor functional status, septic.  Discharge Planning:  To be determined, based on outcomes.       Primary Diagnoses: Present on Admission:  Severe  sepsis (HCC)  Type 2 diabetes mellitus with complication, without long-term current use of insulin (HCC)  Pulmonary embolism (HCC)  Encephalopathy  Hypertension  Chronic diastolic CHF (congestive heart failure) (HCC)  Sacral decubitus ulcer, stage IV (HCC)   I have reviewed the medical record, interviewed the patient and family, and examined the patient. The following aspects are pertinent.  Past Medical History:  Diagnosis Date   Bilateral lower extremity edema: chronic with venous stasis changes 06/02/2014   Bipolar 1 disorder (HCC)    Bipolar disorder (HCC)    CHF (congestive heart failure) (HCC)    Chronic kidney disease    Gout    Gout    Hx of blood clots    Hypertension    Mood swings    OSA on CPAP 06/02/2014   Other and unspecified hyperlipidemia 06/02/2014   Type II or unspecified type diabetes mellitus with unspecified complication, uncontrolled 06/02/2014   Social History   Socioeconomic History   Marital status: Divorced    Spouse name: Not on file   Number of children: Not on file   Years of education: Not on file   Highest education level: Not on file  Occupational History   Not on file  Tobacco Use   Smoking status: Never   Smokeless tobacco: Never  Vaping Use   Vaping Use: Never used  Substance and Sexual Activity   Alcohol use: No   Drug use: No   Sexual activity: Not Currently  Other Topics Concern   Not on file  Social History Narrative   *The patient is divorced and is currently on disability. He lives alone and has one daughter.             Social Determinants of Health   Financial Resource Strain: Not on file  Food Insecurity: Not on file  Transportation Needs: Not on file  Physical Activity: Not on file  Stress: Not on file  Social Connections: Not on file   Family History  Problem Relation Age of Onset   Dementia Mother    Arthritis Father    Hypertension Father    Mental illness Sister    Diabetes Sister    Heart failure Neg Hx     Kidney failure Neg Hx    Cancer Neg Hx    Scheduled Meds:  ARIPiprazole  10 mg Per Tube q morning   benztropine  1 mg Per Tube BID   Chlorhexidine Gluconate Cloth  6 each Topical Q0600   feeding supplement (PROSource TF)  45 mL Per Tube BID   insulin aspart  0-9 Units Subcutaneous Q4H   liothyronine  10 mcg Per Tube Daily   nutrition supplement (JUVEN)  1 packet Per Tube BID BM   rivaroxaban  20 mg Oral Q supper   senna-docusate  2 tablet Oral BID   tamsulosin  0.4 mg Oral Daily   Continuous Infusions:  ceFEPime (MAXIPIME) IV Stopped (  05/31/22 0538)   feeding supplement (VITAL 1.5 CAL) 1,000 mL (05/31/22 1230)   metronidazole 500 mg (05/31/22 0911)   vancomycin Stopped (05/30/22 1944)   PRN Meds:.acetaminophen **OR** acetaminophen, ondansetron **OR** ondansetron (ZOFRAN) IV Medications Prior to Admission:  Prior to Admission medications   Medication Sig Start Date End Date Taking? Authorizing Provider  acetaminophen (TYLENOL) 500 MG tablet Take 1,000 mg by mouth daily as needed (pain.).   Yes [provider]  ARIPiprazole (ABILIFY) 10 MG tablet Take 1 tablet (10 mg total) by mouth every morning. 04/29/22  Yes Lanae Boast, MD  atorvastatin (LIPITOR) 10 MG tablet Take 1 tablet (10 mg total) by mouth daily at 6 PM. Patient taking differently: Take 10 mg by mouth every evening. 02/05/20  Yes Starkes-Perry, Juel Burrow, FNP  benztropine (COGENTIN) 1 MG tablet Take 1 tablet (1 mg total) by mouth 2 (two) times daily. 04/29/22  Yes Lanae Boast, MD  bisacodyl (DULCOLAX) 5 MG EC tablet Take 2 tablets (10 mg total) by mouth daily as needed for moderate constipation. 04/29/22  Yes Lanae Boast, MD  HYDROcodone-acetaminophen (NORCO/VICODIN) 5-325 MG tablet Take 1 tablet by mouth every 6 (six) hours as needed for up to 4 doses for moderate pain. 04/29/22  Yes Kc, Dayna Barker, MD  insulin aspart (NOVOLOG) 100 UNIT/ML FlexPen Before each meal 3 times a day, 140-199 - 2 units, 200-250 - 4 units, 251-299 - 6  units,  300-349 - 8 units,  350 or above 10 units. Insulin PEN if approved, provide syringes and needles if needed. 05/20/22  Yes Leroy Sea, MD  insulin glargine-yfgn (SEMGLEE) 100 UNIT/ML injection Inject 0.15 mLs (15 Units total) into the skin 2 (two) times daily. 05/20/22  Yes Leroy Sea, MD  liothyronine (CYTOMEL) 5 MCG tablet Take 10 mcg by mouth daily.   Yes [provider]  melatonin 10 MG TABS Take 10 mg by mouth at bedtime as needed (sleep). 04/29/22  Yes Lanae Boast, MD  metoprolol succinate (TOPROL-XL) 50 MG 24 hr tablet Take 1 tablet (50 mg total) by mouth daily. Take with or immediately following a meal. Patient taking differently: Take 50 mg by mouth daily. 01/28/22 03/13/23 Yes Marinda Elk, MD  Nutritional Supplements (FEEDING SUPPLEMENT, GLUCERNA 1.5 CAL,) LIQD Place 1,000 mLs into feeding tube daily. 05/20/22  Yes Leroy Sea, MD  Nutritional Supplements (FEEDING SUPPLEMENT, PROSOURCE TF,) liquid Place 45 mLs into feeding tube 3 (three) times daily. 05/20/22  Yes Leroy Sea, MD  polyethylene glycol powder (GLYCOLAX/MIRALAX) 17 GM/SCOOP powder Take 17 g by mouth daily. Patient taking differently: Take 17 g by mouth daily as needed for mild constipation. 10/14/20  Yes Ghimire, Werner Lean, MD  rivaroxaban (XARELTO) 20 MG TABS tablet Take 1 tablet (20 mg total) by mouth daily with supper. Patient taking differently: Take 20 mg by mouth daily. 04/02/20  Yes Dhungel, Nishant, MD  senna-docusate (SENOKOT-S) 8.6-50 MG tablet Take 2 tablets by mouth 2 (two) times daily. 04/29/22  Yes Lanae Boast, MD  tamsulosin (FLOMAX) 0.4 MG CAPS capsule Take 1 capsule (0.4 mg total) by mouth daily. 05/21/22  Yes Leroy Sea, MD  Vitamin D, Ergocalciferol, (DRISDOL) 1.25 MG (50000 UNIT) CAPS capsule Take 50,000 Units by mouth every 7 (seven) days.   Yes [provider]  Water For Irrigation, Sterile (FREE WATER) SOLN Place 150 mLs into feeding tube every 6 (six)  hours. 05/20/22  Yes Leroy Sea, MD   Allergies  Allergen Reactions   Amlodipine  Swelling and Other (See Comments)    Legs became swollen   Onglyza [Saxagliptin] Other (See Comments)    "Allergic," per [facility]   Zyprexa [Olanzapine] Other (See Comments)    Allergy noted by ACT Team. No reaction specified (??)- "Allergic," per [facility]   Ritalin [Methylphenidate] Anxiety and Other (See Comments)    "Maniac"   Review of Systems  Unable to perform ROS: Other    Physical Exam Vitals and nursing note reviewed.  Constitutional:      General: He is not in acute distress.    Appearance: He is ill-appearing.  HENT:     Mouth/Throat:     Mouth: Mucous membranes are moist.  Cardiovascular:     Rate and Rhythm: Normal rate.  Pulmonary:     Effort: Pulmonary effort is normal. No respiratory distress.  Musculoskeletal:     Comments: Sacral wounds as depicted in chart  Skin:    General: Skin is warm and dry.  Neurological:     Mental Status: He is alert.     Comments: Oriented to person and situation, has legal guardian  Psychiatric:        Mood and Affect: Mood normal.        Behavior: Behavior normal.     Vital Signs: BP 113/63 (BP Location: Left Arm)   Pulse 92   Temp 98.5 F (36.9 C) (Oral)   Resp 18   Ht 6' (1.829 m)   Wt 101.8 kg   SpO2 99%   BMI 30.44 kg/m  Pain Scale: 0-10   Pain Score: 0-No pain   SpO2: SpO2: 99 % O2 Device:SpO2: 99 % O2 Flow Rate: .O2 Flow Rate (L/min): 0 L/min  IO: Intake/output summary:  Intake/Output Summary (Last 24 hours) at 05/31/2022 1343 Last data filed at 05/31/2022 1300 Gross per 24 hour  Intake 0 ml  Output 3700 ml  Net -3700 ml    LBM: Last BM Date : 05/31/22 Baseline Weight: Weight: 136 kg Most recent weight: Weight: 101.8 kg     Palliative Assessment/Data:   Flowsheet Rows    Flowsheet Row Most Recent Value  Intake Tab   Referral Department Hospitalist  Unit at Time of Referral  Cardiac/Telemetry Unit  Palliative Care Primary Diagnosis Sepsis/Infectious Disease  Date Notified 05/27/22  Palliative Care Type Return patient Palliative Care  Reason for referral Clarify Goals of Care  Date of Admission 05/27/22  Date first seen by Palliative Care 05/31/22  # of days Palliative referral response time 4 Day(s)  # of days IP prior to Palliative referral 0  Clinical Assessment   Palliative Performance Scale Score 20%  Pain Max last 24 hours Not able to report  Pain Min Last 24 hours Not able to report  Dyspnea Max Last 24 Hours Not able to report  Dyspnea Min Last 24 hours Not able to report  Psychosocial & Spiritual Assessment   Palliative Care Outcomes        Time In: 1220 Time Out: 1335 Time Total: 75 minutes  Greater than 50%  of this time was spent counseling and coordinating care related to the above assessment and plan.  Signed by: Katheran Awe, NP   Please contact Palliative Medicine Team phone at [phone] for questions and concerns.  For individual provider: See Loretha Stapler

## 2022-05-31 NOTE — Progress Notes (Signed)
Contacted Vascular Access Specialty/IV Team Service went to voicemail left message to place PICC line for patient.

## 2022-05-31 NOTE — Progress Notes (Signed)
      [facility]  Date of Admission:  05/27/2022           Reason for visit: Follow up on sacral wound, OM, bacteremia  Current antibiotics: Cefepime Vanco Flagyl  ASSESSMENT:    71 y.o. male admitted with:  Strep constellatus bacteremia: 05/27/22 cultures positive and repeat blood cultures 05/29/22 remain NGTD.   Sacral decubitus ulcer and osteomyelitis: Patient presenting with sepsis on admission and CT scan showing osteomyelitis that was new from CT on 04/13/22.  Bone biopsy cultures are growing Proteus mirabilis and Enterococcus faecalis with susceptibilities pending.  Pathology pending.   RECOMMENDATIONS:    Stop cefepime, vancomycin, metronidazole Start Zosyn Agree with cardiology that TEE will not change his management drastically and likelihood for IE is low with Strep constellatus especially since this is a different isolate than his previous Strep constellatus bacteremia.  Wound care. Will follow.   24 hour events:  No events noted Seen by cardiology, no plans for TEE Continues on broad antibiotics Afebrile Bone biopsy cx = polymicrobial gram stain.  Cx with E faecalis and Proteus mirabilis.  Path pending. Blood cx 7/1 = NGTD Blood cx 6/29 = Strep constellatus    Lady Deutscher [facility] [phone] pager 05/31/2022, 1:49 PM  I have spent >5 min reviewing chart, formulating plan, discussing with TRH.

## 2022-05-31 NOTE — Progress Notes (Signed)
Okay to place PICC per Dr. Earlene Plater.  Dr. Earlene Plater requests SL be placed.

## 2022-05-31 NOTE — Progress Notes (Addendum)
Peripherally Inserted Central Catheter Placement  The IV Nurse has discussed with the patient and/or persons authorized to consent for the patient, the purpose of this procedure and the potential benefits and risks involved with this procedure.  The benefits include less needle sticks, lab draws from the catheter, and the patient may be discharged home with the catheter. Risks include, but not limited to, infection, bleeding, blood clot (thrombus formation), and puncture of an artery; nerve damage and irregular heartbeat and possibility to perform a PICC exchange if needed/ordered by physician.  Alternatives to this procedure were also discussed.  Bard Power PICC patient education guide, fact sheet on infection prevention and patient information card has been provided to patient /or left at bedside.  Telephone consent obtained from POA/daughter [name].  Terra, LPN aware that right PIV needs to be removed and all tubings changed prior to accessing PICC.  PICC Placement Documentation  PICC Single Lumen 05/31/22 Right Brachial 41 cm 0 cm (Active)  Indication for Insertion or Continuance of Line Home intravenous therapies (PICC only);Prolonged intravenous therapies 05/31/22 1651  Exposed Catheter (cm) 0 cm 05/31/22 1651  Site Assessment Clean;Dry;Intact 05/31/22 1651  Line Status Flushed;Saline locked;Blood return noted 05/31/22 1651  Dressing Type Transparent;Securing device 05/31/22 1651  Dressing Status Antimicrobial disc in place 05/31/22 1651  Safety Lock Not Applicable 05/31/22 1651  Line Care Connections checked and tightened 05/31/22 1651  Line Adjustment (NICU/IV Team Only) No 05/31/22 1651  Dressing Intervention New dressing 05/31/22 1651  Dressing Change Due 06/07/22 05/31/22 1651       Jadelynn Boylan, Lajean Manes 05/31/2022, 4:52 PM

## 2022-06-01 DIAGNOSIS — R6520 Severe sepsis without septic shock: Secondary | ICD-10-CM | POA: Diagnosis not present

## 2022-06-01 DIAGNOSIS — A419 Sepsis, unspecified organism: Secondary | ICD-10-CM | POA: Diagnosis not present

## 2022-06-01 LAB — BASIC METABOLIC PANEL
Anion gap: 3 — ABNORMAL LOW (ref 5–15)
BUN: 32 mg/dL — ABNORMAL HIGH (ref 8–23)
CO2: 24 mmol/L (ref 22–32)
Calcium: 9.4 mg/dL (ref 8.9–10.3)
Chloride: 108 mmol/L (ref 98–111)
Creatinine, Ser: 0.77 mg/dL (ref 0.61–1.24)
GFR, Estimated: >60 mL/min (ref >60)
Glucose, Bld: 299 mg/dL — ABNORMAL HIGH (ref 70–99)
Potassium: 4.1 mmol/L (ref 3.5–5.1)
Sodium: 135 mmol/L (ref 135–145)

## 2022-06-01 LAB — GLUCOSE, CAPILLARY
Glucose-Capillary: 267 mg/dL — ABNORMAL HIGH (ref 70–99)
Glucose-Capillary: 294 mg/dL — ABNORMAL HIGH (ref 70–99)
Glucose-Capillary: 299 mg/dL — ABNORMAL HIGH (ref 70–99)
Glucose-Capillary: 262 mg/dL — ABNORMAL HIGH (ref 70–99)
Glucose-Capillary: 350 mg/dL — ABNORMAL HIGH (ref 70–99)

## 2022-06-01 LAB — CULTURE, BLOOD (ROUTINE X 2)
Culture: NO GROWTH
Special Requests: ADEQUATE

## 2022-06-01 LAB — CBC
HCT: 31.6 % — ABNORMAL LOW (ref 39.0–52.0)
Hemoglobin: 9.9 g/dL — ABNORMAL LOW (ref 13.0–17.0)
MCH: 27.3 pg (ref 26.0–34.0)
MCHC: 31.3 g/dL (ref 30.0–36.0)
MCV: 87.3 fL (ref 80.0–100.0)
Platelets: 290 10*3/uL (ref 150–400)
RBC: 3.62 MIL/uL — ABNORMAL LOW (ref 4.22–5.81)
RDW: 15.1 % (ref 11.5–15.5)
WBC: 7 10*3/uL (ref 4.0–10.5)
nRBC: 0 % (ref 0.0–0.2)

## 2022-06-01 LAB — MAGNESIUM: Magnesium: 1.6 mg/dL — ABNORMAL LOW (ref 1.7–2.4)

## 2022-06-01 MED ORDER — INSULIN ASPART 100 UNIT/ML IJ SOLN
0.0000 [IU] | INTRAMUSCULAR | Status: DC
  Administered 2022-06-01: 11 [IU] via SUBCUTANEOUS
  Administered 2022-06-01 – 2022-06-02 (×2): 8 [IU] via SUBCUTANEOUS
  Administered 2022-06-02: 11 [IU] via SUBCUTANEOUS

## 2022-06-01 MED ORDER — INSULIN ASPART 100 UNIT/ML IJ SOLN: 0.0000 [IU] | INTRAMUSCULAR | Status: DC

## 2022-06-01 MED ORDER — MAGNESIUM SULFATE 2 GM/50ML IV SOLN
2.0000 g | Freq: Once | INTRAVENOUS | Status: AC
  Administered 2022-06-01: 2 g via INTRAVENOUS
  Filled 2022-06-01: qty 50

## 2022-06-01 MED ORDER — METOPROLOL SUCCINATE ER 50 MG PO TB24
50.0000 mg | ORAL_TABLET | Freq: Every day | ORAL | Status: DC
  Administered 2022-06-01 – 2022-06-04 (×4): 50 mg via ORAL
  Filled 2022-06-01 (×4): qty 1

## 2022-06-01 NOTE — Consult Note (Signed)
WOC Nurse Consult Note: Reason for Consult:Notified by Bedside RN J. Weatherly that there are no current orders for the care of the patient's right heel deep tissue pressure injury (DTPI)that was present and continuing from previous admissions. Wound type:Pressure Pressure Injury POA: Yes Measurement:4.4cm x 2.5cm Wound AJG:[id number] blood blister Drainage (amount, consistency, odor) None Periwound:Intact Dressing procedure/placement/frequency: Orders are provided and include the existing dressing, a silicone foam bordered dressing for the heel and off loading using bilateral pressure redistribution heel boots, Prevalon.  WOC nursing team will not follow, but will remain available to this patient, the nursing and medical teams.  Please re-consult if needed.  Thank you for inviting [place] to participate in this patient's Plan of Care.  Ladona Mow, MSN, RN, CNS, GNP, Leda Min, [employer], [employer] phone:  [phone]

## 2022-06-01 NOTE — Progress Notes (Signed)
Speech Language Pathology Treatment: Dysphagia  Patient Details Name: Christian [name] MRN: [id number] DOB: 1951/08/19 Today's Date: 06/01/2022 Time: 2706-2376 SLP Time Calculation (min) (ACUTE ONLY): 16 min  Assessment / Plan / Recommendation Clinical Impression  Pt seen for ongoing dysphagia intervention following clinical swallow evaluation completed yesterday. Pt was recently seen at [facility] by SLP and was discharged from SLP service on regular textures and thin liquids, however a PEG was placed during that admission to supplement nutrition. Pt's wife and daughter at bedside this date and report that Pt mostly drinks liquids at this time and some fruit. SLP reviewed aspiration precautions and need to position Pt as upright as possible (difficult with air mattress) and assist with quantity/rate of intake with straws. Pt consumed thin liquids this date without incident. Ok to continue to offer puree and thins when Pt is alert and upright. Pt in agreement with plan of care. SLP will continue to follow.   HPI HPI: GLENARD KEESLING is a 71 y.o. male with medical history significant for diabetes mellitus, CVA, bipolar disorder, coronary artery disease, PE and DVT, CKD 3, atrial fibrillation, OSA, gastrostomy tube and chronic Foley catheter. Patient reports to the ED from nursing home reports of altered mental status, increasing lethargy and fever, with tachycardia heart rate in the 130s.  Patient was admitted with concerns of severe sepsis in the setting of sacral decubitus ulcer which is now demonstrating findings of new osteomyelitis.  He is now noted to have strep bacteremia and ID is following.      SLP Plan  Continue with current plan of care      Recommendations for follow up therapy are one component of a multi-disciplinary discharge planning process, led by the attending physician.  Recommendations may be updated based on patient status, additional functional criteria and insurance  authorization.    Recommendations  Diet recommendations: Dysphagia 1 (puree);Thin liquid Liquids provided via: Straw;Cup Medication Administration: Crushed with puree Supervision: Full supervision/cueing for compensatory strategies;Trained caregiver to feed patient Compensations: Slow rate;Small sips/bites;Minimize environmental distractions Postural Changes and/or Swallow Maneuvers: Seated upright 90 degrees                Oral Care Recommendations: Oral care BID;Staff/trained caregiver to provide oral care Follow Up Recommendations: No SLP follow up Assistance recommended at discharge: Set up Supervision/Assistance SLP Visit Diagnosis: Dysphagia, unspecified (R13.10) Plan: Continue with current plan of care         Thank you,  Havery Moros, CCC-SLP [phone]    Vijay Durflinger  06/01/2022, 2:32 PM

## 2022-06-01 NOTE — Evaluation (Signed)
Occupational Therapy Evaluation Patient Details Name: Christian [name] MRN: [id number] DOB: [birth year] Today's Date: 06/01/2022   History of Present Illness Christian [name] is a 71 y.o. male with medical history significant for diabetes mellitus, CVA, bipolar disorder, coronary artery disease, PE and DVT, CKD 3, atrial fibrillation, OSA, gastrostomy tube and chronic Foley catheter.  Patient reports to the ED from nursing home reports of altered mental status, increasing lethargy and fever, with tachycardia heart rate in the 130s.  At the time of my evaluation, patient is awake, is not and has not been responding to questions or following directions in the ED.  ED provider attempted to reach nursing home, was unsuccessful.  I am unable to reach nursing home.  Family was at bedside and reported that at baseline he is generally quiet, but somewhat conversant and interactive.  G-tube was placed last week.   Clinical Impression   Pt seen with PT this am, nodding head in agreement for therapy. Pt initially refusing mobility, however did assist in reaching towards bedrail and rolling. Pt currently requiring total assist for ADLs, +2 for mobility. Recommend SNF/LTC on discharge, progress dependent on pt's medical stability and improvement.       Recommendations for follow up therapy are one component of a multi-disciplinary discharge planning process, led by the attending physician.  Recommendations may be updated based on patient status, additional functional criteria and insurance authorization.   Follow Up Recommendations  Skilled nursing-short term rehab (<3 hours/day)    Assistance Recommended at Discharge Frequent or constant Supervision/Assistance  Patient can return home with the following Two people to help with walking and/or transfers;Two people to help with bathing/dressing/bathroom;Assist for transportation;Help with stairs or ramp for entrance;Assistance with feeding;Assistance with  cooking/housework;Direct supervision/assist for medications management;Direct supervision/assist for financial management    Functional Status Assessment  Patient has had a recent decline in their functional status and/or demonstrates limited ability to make significant improvements in function in a reasonable and predictable amount of time  Equipment Recommendations  None recommended by OT    Recommendations for Other Services       Precautions / Restrictions Precautions Precautions: Fall Precaution Comments: monitor HR, G-tube, Stage IV decubitus ulcer and R heel deep tissue pressure injury Restrictions Weight Bearing Restrictions: No      Mobility Bed Mobility Overal bed mobility: Needs Assistance Bed Mobility: Rolling Rolling: Max assist, +2 for physical assistance         General bed mobility comments: pt able to hold bedrail for rolling    Transfers                   General transfer comment: unable to complete          ADL either performed or assessed with clinical judgement   ADL                                         General ADL Comments: Pt currently requiring max to total assist for all ADL completion     Vision Patient Visual Report: No change from baseline              Pertinent Vitals/Pain Pain Assessment Pain Assessment: Faces Faces Pain Scale: Hurts little more Pain Location: sacrum Pain Descriptors / Indicators: Discomfort, Guarding, Grimacing Pain Intervention(s): Limited activity within patient's tolerance, Monitored during session, Repositioned     Hand Dominance  Right   Extremity/Trunk Assessment Upper Extremity Assessment Upper Extremity Assessment: Generalized weakness;Difficult to assess due to impaired cognition   Lower Extremity Assessment Lower Extremity Assessment: Defer to PT evaluation   Cervical / Trunk Assessment Cervical / Trunk Assessment: Normal   Communication  Communication Communication: Expressive difficulties   Cognition Arousal/Alertness: Awake/alert Behavior During Therapy: Flat affect Overall Cognitive Status: History of cognitive impairments - at baseline                                                  Home Living Family/patient expects to be discharged to:: Skilled nursing facility Living Arrangements: Other (Comment) (LTC) Available Help at Discharge: Available PRN/intermittently;Personal care attendant Type of Home: Skilled Nursing Facility Home Access: Level entry     Home Layout: One level     Bathroom Shower/Tub: Producer, television/film/video: Handicapped height     Home Equipment: Rollator (4 wheels);Shower seat;Grab bars - toilet;Grab bars - tub/shower;Wheelchair - Careers adviser (comment)          Prior Functioning/Environment Prior Level of Function : Needs assist       Physical Assist : Mobility (physical) Mobility (physical): Bed mobility;Transfers;Gait   Mobility Comments: assisted for transfers ADLs Comments: ALF assisting with all ADLs        OT Problem List: Decreased strength;Decreased range of motion;Decreased activity tolerance;Impaired balance (sitting and/or standing);Decreased safety awareness;Decreased knowledge of use of DME or AE;Impaired UE functional use      OT Treatment/Interventions: Self-care/ADL training;Therapeutic exercise;DME and/or AE instruction;Therapeutic activities;Patient/family education    OT Goals(Current goals can be found in the care plan section) Acute Rehab OT Goals Patient Stated Goal: None stated OT Goal Formulation: Patient unable to participate in goal setting Time For Goal Achievement: 06/15/22 Potential to Achieve Goals: Fair  OT Frequency: Min 1X/week    Co-evaluation PT/OT/SLP Co-Evaluation/Treatment: Yes Reason for Co-Treatment: Complexity of the patient's impairments (multi-system involvement);For patient/therapist safety;To  address functional/ADL transfers   OT goals addressed during session: ADL's and self-care         End of Session    Activity Tolerance: Patient limited by fatigue;Patient limited by lethargy Patient left: in bed;with call bell/phone within reach  OT Visit Diagnosis: Muscle weakness (generalized) (M62.81)                Time: 3810-1751 OT Time Calculation (min): 17 min Charges:  OT General Charges $OT Visit: 1 Visit OT Evaluation $OT Eval Moderate Complexity: 1 [address], OTR/L  [phone] 06/01/2022, 9:24 AM

## 2022-06-01 NOTE — Progress Notes (Addendum)
Id brief note    Chronic OM Strep constellatus bacteremia -- deferring tee low suspicion IE  Cx e faecalis and proteus, pending susceptibility   Per primary team noted thrush started on fluconazole    -continue current antimicrobial -f/u proteus susceptibility to make sure not esbl

## 2022-06-01 NOTE — Plan of Care (Signed)
  Problem: Acute Rehab OT Goals (only OT should resolve) Goal: Pt. Will Perform Eating Flowsheets (Taken 06/01/2022 0926) Pt Will Perform Eating:  with min assist  sitting  bed level Goal: Pt. Will Perform Grooming Flowsheets (Taken 06/01/2022 0926) Pt Will Perform Grooming:  with set-up  with min assist  sitting  bed level Goal: Pt. Will Perform Upper Body Dressing Flowsheets (Taken 06/01/2022 0926) Pt Will Perform Upper Body Dressing:  with mod assist  bed level Goal: Pt/Caregiver Will Perform Home Exercise Program Flowsheets (Taken 06/01/2022 [phone]) Pt/caregiver will Perform Home Exercise Program:  Increased ROM  Increased strength  Both right and left upper extremity  With minimal assist  With written HEP provided

## 2022-06-01 NOTE — Progress Notes (Signed)
Physical Therapy Treatment Patient Details Name: Christian [name] MRN: [id number] DOB: [birth year] Today's Date: 06/01/2022   History of Present Illness Christian [name] is a 71 y.o. male with medical history significant for diabetes mellitus, CVA, bipolar disorder, coronary artery disease, PE and DVT, CKD 3, atrial fibrillation, OSA, gastrostomy tube and chronic Foley catheter.  Patient reports to the ED from nursing home reports of altered mental status, increasing lethargy and fever, with tachycardia heart rate in the 130s.  At the time of my evaluation, patient is awake, is not and has not been responding to questions or following directions in the ED.  ED provider attempted to reach nursing home, was unsuccessful.  I am unable to reach nursing home.  Family was at bedside and reported that at baseline he is generally quiet, but somewhat conversant and interactive.  G-tube was placed last week.    PT Comments    Patient lying supine in bed; CPAP in place.  Co-treat with OT today. He awakens and initially shakes his head no to therapy request to work on rolling in bed but then is cooperative. Patient is max assist x 2 for rolling to the right and to the left today. He is able to reach bed railing with opposite hand but not able to assist with pulling to roll.  Left patient in left sidelying with pillow behind his back and between his knees.  Nursing notified.  Patient will benefit from continued skilled therapy interventions to address deficits and promote normal function.     Recommendations for follow up therapy are one component of a multi-disciplinary discharge planning process, led by the attending physician.  Recommendations may be updated based on patient status, additional functional criteria and insurance authorization.  Follow Up Recommendations  Skilled nursing-short term rehab (<3 hours/day) Can patient physically be transported by private vehicle: No   Assistance Recommended at  Discharge Intermittent Supervision/Assistance  Patient can return home with the following Help with stairs or ramp for entrance;A lot of help with bathing/dressing/bathroom;Assistance with cooking/housework;Two people to help with walking and/or transfers   Equipment Recommendations  None recommended by PT    Recommendations for Other Services       Precautions / Restrictions Precautions Precautions: Fall Precaution Comments: monitor HR, G-tube, Stage IV decubitus ulcer and R heel deep tissue pressure injury Restrictions Weight Bearing Restrictions: No     Mobility  Bed Mobility Overal bed mobility: Needs Assistance Bed Mobility: Rolling Rolling: Max assist, +2 for physical assistance Sidelying to sit: Max assist, +2 for physical assistance Supine to sit: Max assist Sit to supine: Max assist Sit to sidelying: +2 for physical assistance, Max assist General bed mobility comments: pt able to hold bedrail for rolling    Transfers                   General transfer comment: unable to complete    Ambulation/Gait                   Stairs             Wheelchair Mobility    Modified Rankin (Stroke Patients Only)       Balance       Sitting balance - Comments: did not attempt sitting up today; max assist for rolling in bed with patient shaking his head "no"  Cognition Arousal/Alertness: Awake/alert Behavior During Therapy: Flat affect Overall Cognitive Status: History of cognitive impairments - at baseline                                          Exercises      General Comments        Pertinent Vitals/Pain Pain Assessment Pain Assessment: Faces Faces Pain Scale: Hurts little more Breathing: normal Negative Vocalization: occasional moan/groan, low speech, negative/disapproving quality Pain Descriptors / Indicators: Discomfort, Guarding, Grimacing Pain Intervention(s):  Monitored during session    Home Living Family/patient expects to be discharged to:: Skilled nursing facility Living Arrangements: Other (Comment) (LTC) Available Help at Discharge: Available PRN/intermittently;Personal care attendant Type of Home: Skilled Nursing Facility Home Access: Level entry       Home Layout: One level Home Equipment: Rollator (4 wheels);Shower seat;Grab bars - toilet;Grab bars - tub/shower;Wheelchair - Careers adviser (comment)      Prior Function            PT Goals (current goals can now be found in the care plan section) Acute Rehab PT Goals Patient Stated Goal: return home PT Goal Formulation: With patient Time For Goal Achievement: 06/11/22 Potential to Achieve Goals: Good Progress towards PT goals: Progressing toward goals    Frequency    Min 3X/week      PT Plan Current plan remains appropriate    Co-evaluation PT/OT/SLP Co-Evaluation/Treatment: Yes Reason for Co-Treatment: For patient/therapist safety;To address functional/ADL transfers PT goals addressed during session: Mobility/safety with mobility OT goals addressed during session: ADL's and self-care      AM-PAC PT "6 Clicks" Mobility   Outcome Measure  Help needed turning from your back to your side while in a flat bed without using bedrails?: A Lot Help needed moving from lying on your back to sitting on the side of a flat bed without using bedrails?: A Lot Help needed moving to and from a bed to a chair (including a wheelchair)?: Total Help needed standing up from a chair using your arms (e.g., wheelchair or bedside chair)?: Total Help needed to walk in hospital room?: Total Help needed climbing 3-5 steps with a railing? : Total 6 Click Score: 8    End of Session Equipment Utilized During Treatment: Other (comment) (Cpap) Activity Tolerance: Patient limited by pain Patient left: in bed Nurse Communication: Mobility status       Time: 3818-2993 PT Time Calculation  (min) (ACUTE ONLY): 16 min  Charges:  $Therapeutic Activity: 8-22 mins                     11:09 AM, 06/01/22 Sophie Quiles Small Treazure Nery MPT  physical therapy [place] [phone] Ph:[phone]

## 2022-06-01 NOTE — Progress Notes (Signed)
Call from central tele, pt with run of bigeminy.  Entered pt room, pt sleeping, no acute distress.  VSS except low grade axillary temp, given tylenol.  Message sent to Dr. Thomes Dinning with acknowledgement of message receipt, no new orders.  Will continue to monitor.

## 2022-06-01 NOTE — Progress Notes (Signed)
PROGRESS NOTE    Christian [name]  OJJ:[id number] DOB: [birth year] DOA: 05/27/2022 PCP: Christian James, MD   Brief Narrative:    Christian [name] is a 71 y.o. male with medical history significant for diabetes mellitus, CVA, bipolar disorder, coronary artery disease, PE and DVT, CKD 3, atrial fibrillation, OSA, gastrostomy tube and chronic Foley catheter. Patient reports to the ED from nursing home reports of altered mental status, increasing lethargy and fever, with tachycardia heart rate in the 130s.  Patient was admitted with concerns of severe sepsis in the setting of sacral decubitus ulcer which is now demonstrating findings of new osteomyelitis.  He is now noted to have strep constellatus bacteremia and ID is following.  Bone biopsy cultures with Enterococcus faecalis and Proteus with susceptibilities pending and patient remains on Zosyn.  He is also noted to have oropharyngeal candidiasis for which he has been started on Diflucan.  Assessment & Plan:   Principal Problem:   Severe sepsis (HCC) Active Problems:   Urinary tract infection associated with catheterization of urinary tract (HCC)   Encephalopathy   Sacral decubitus ulcer, stage IV (HCC)   Chronic diastolic CHF (congestive heart failure) (HCC)   Hypertension   Type 2 diabetes mellitus with complication, without long-term current use of insulin (HCC)   OSA on CPAP   Diabetes mellitus type 2 in nonobese (HCC)   Pulmonary embolism (HCC)   Osteomyelitis (HCC)  Assessment and Plan:  Severe sepsis, present on admission, with strep constellatus bacteremia and sacral osteomyelitis -Strep constellatus bacteremia noted and patient has been changed to IV Zosyn per ID with susceptibilities pending on bone biopsy with noted growth of Enterococcus faecalis and Proteus.  Need to ensure that there is no ESBL prior to discharge. -No need for TEE per cardiology -PICC line placed 7/3     Sacral decubitus ulcer, stage IV (HCC) Large stage  IV sacral decubitus ulcer.  Patient nonambulatory.  Poor functional status and debility after 2 prolonged recent hospitalizations in April and then May to June.  CT suggesting osteomyelitis of the coccyx.  No abscess. -Appreciate debridement and possible bone biopsy with general surgery 7/1, culture susceptibilities pending   Encephalopathy-resolved Acute metabolic encephalopathy likely secondary to severe sepsis. -CT head with no acute findings -Resumed home psychoactive medications      Urinary tract infection associated with catheterization of urinary tract (HCC) -Although this was suspected on admission, seems less likely with no growth in urine culture -UA also collected from old Foley, which has not been exchanged   Pulmonary embolism (HCC) Resumed home Xarelto 7/2   Type 2 diabetes mellitus with complication, without long-term current use of insulin (HCC) Controlled, A1c 7.6.  Glucose 272.   -Hold home long-acting insulin 15 units twice daily - SSI- S     Hypertension Stable. -Resume home metoprolol -As needed labetalol for systolic greater than 170  Hypomagnesemia Replete and reevaluate in a.m.     DVT prophylaxis: Xarelto Code Status: Full Family Communication: None at bedside Disposition Plan:  Status is: Inpatient Remains inpatient appropriate because: IV medications     Skin Assessment:   I have examined the patient's skin and I agree with the wound assessment as performed by the wound care RN as outlined below:          Pressure Injury 03/31/22 Sacrum Mid Stage 4 - Full thickness tissue loss with exposed bone, tendon or muscle. (Active)  03/31/22 1933  Location: Sacrum  Location Orientation: Mid  Staging: Stage  4 - Full thickness tissue loss with exposed bone, tendon or muscle.  Wound Description (Comments):   Present on Admission: Yes  Dressing Type Gauze (Comment) 05/28/22 0000     Pressure Injury 05/08/22 Heel Right Deep Tissue Pressure Injury -  Purple or maroon localized area of discolored intact skin or blood-filled blister due to damage of underlying soft tissue from pressure and/or shear. (Active)  05/08/22 1600  Location: Heel  Location Orientation: Right  Staging: Deep Tissue Pressure Injury - Purple or maroon localized area of discolored intact skin or blood-filled blister due to damage of underlying soft tissue from pressure and/or shear.  Wound Description (Comments):   Present on Admission: No  Dressing Type None 05/28/22 0000      Consultants:  Gen Surg Palliative care ID Cardiology   Procedures:  Bone biopsy to decubitus ulcer on 7/1 PICC placement 7/3    Antimicrobials:  Anti-infectives (From admission, onward)    Start     Dose/Rate Route Frequency Ordered Stop   06/01/22 1800  fluconazole (DIFLUCAN) IVPB 100 mg        100 mg 50 mL/hr over 60 Minutes Intravenous Every 24 hours 05/31/22 1658     05/31/22 1745  fluconazole (DIFLUCAN) IVPB 200 mg        200 mg 100 mL/hr over 60 Minutes Intravenous  Once 05/31/22 1657 05/31/22 1840   05/31/22 1500  piperacillin-tazobactam (ZOSYN) IVPB 3.375 g        3.375 g 12.5 mL/hr over 240 Minutes Intravenous Every 8 hours 05/31/22 1409     05/28/22 1800  vancomycin (VANCOREADY) IVPB 1750 mg/350 mL  Status:  Discontinued        1,750 mg 175 mL/hr over 120 Minutes Intravenous Every 24 hours 05/27/22 1753 05/31/22 1400   05/27/22 2345  metroNIDAZOLE (FLAGYL) IVPB 500 mg  Status:  Discontinued        500 mg 100 mL/hr over 60 Minutes Intravenous Every 12 hours 05/27/22 2337 05/31/22 1400   05/27/22 2200  ceFEPIme (MAXIPIME) 2 g in sodium chloride 0.9 % 100 mL IVPB  Status:  Discontinued        2 g 200 mL/hr over 30 Minutes Intravenous Every 8 hours 05/27/22 1750 05/31/22 1400   05/27/22 1800  vancomycin (VANCOREADY) IVPB 2000 mg/400 mL        2,000 mg 200 mL/hr over 120 Minutes Intravenous  Once 05/27/22 1750 05/27/22 1930   05/27/22 1700  cefTRIAXone (ROCEPHIN) 1 g in  sodium chloride 0.9 % 100 mL IVPB        1 g 200 mL/hr over 30 Minutes Intravenous  Once 05/27/22 1651 05/27/22 1812      Subjective: Patient seen and evaluated today with no new acute complaints or concerns. No acute concerns or events noted overnight.  Objective: Vitals:   05/31/22 1239 05/31/22 2027 06/01/22 0345 06/01/22 0429  BP: 113/63 (!) 169/75 (!) 145/70 108/76  Pulse: 92 93 91 92  Resp: 18 20 20 19   Temp: 98.5 F (36.9 C) 99.9 F (37.7 C) 99.6 F (37.6 C) 99.2 F (37.3 C)  TempSrc: Oral Oral Axillary   SpO2: 99% 100% 100% 96%  Weight:      Height:        Intake/Output Summary (Last 24 hours) at 06/01/2022 1104 Last data filed at 06/01/2022 0927 Gross per 24 hour  Intake 7283.61 ml  Output 3325 ml  Net 3958.61 ml   Filed Weights   05/27/22 1139 05/27/22 2300 05/29/22 1000  Weight: 136 kg 95.7 kg 101.8 kg    Examination:  General exam: Appears lethargic, but arousable Respiratory system: Clear to auscultation. Respiratory effort normal. Cardiovascular system: S1 & S2 heard, RRR.  Gastrointestinal system: Abdomen is soft, feeding tube C/D/I Central nervous system: Lethargic but arousable on CPAP Extremities: No edema Skin: No significant lesions noted Psychiatry: Flat affect.    Data Reviewed: I have personally reviewed following labs and imaging studies  CBC: Recent Labs  Lab 05/27/22 1332 05/28/22 0556 05/29/22 0551 05/30/22 0551 05/31/22 0609 06/01/22 0546  WBC 8.9 10.8* 5.3 6.8 7.8 7.0  NEUTROABS 7.6  --   --   --   --   --   HGB 13.5 13.1 11.0* 11.0* 10.1* 9.9*  HCT 44.0 42.4 35.9* 36.3* 32.8* 31.6*  MCV 88.2 89.3 90.0 90.1 89.1 87.3  PLT 399 336 339 300 275 290   Basic Metabolic Panel: Recent Labs  Lab 05/28/22 0556 05/29/22 0551 05/30/22 0551 05/31/22 0609 06/01/22 0546  NA 137 138 141 134* 135  K 4.8 4.1 4.5 4.2 4.1  CL 106 113* 111 109 108  CO2 25 20* 25 21* 24  GLUCOSE 167* 249* 206* 157* 299*  BUN 55* 48* 41* 34* 32*   CREATININE 1.17 0.94 0.86 0.73 0.77  CALCIUM 9.3 9.0 9.6 9.1 9.4  MG  --  2.2 1.9 1.7 1.6*   GFR: Estimated Creatinine Clearance: 109.1 mL/min (by C-G formula based on SCr of 0.77 mg/dL). Liver Function Tests: Recent Labs  Lab 05/27/22 1332 05/28/22 0556 05/29/22 0551 05/30/22 0551 05/31/22 0609  AST 50* 109* 54* 25 19  ALT 72* 100* 74* 53* 38  ALKPHOS 80 71 63 61 52  BILITOT 0.5 0.6 0.5 0.5 0.5  PROT 8.2* 7.5 6.8 7.1 6.5  ALBUMIN 2.6* 2.2* 2.0* 2.1* 2.0*   No results for input(s): "LIPASE", "AMYLASE" in the last 168 hours. No results for input(s): "AMMONIA" in the last 168 hours. Coagulation Profile: Recent Labs  Lab 05/27/22 1332  INR 1.4*   Cardiac Enzymes: No results for input(s): "CKTOTAL", "CKMB", "CKMBINDEX", "TROPONINI" in the last 168 hours. BNP (last 3 results) No results for input(s): "PROBNP" in the last 8760 hours. HbA1C: No results for input(s): "HGBA1C" in the last 72 hours. CBG: Recent Labs  Lab 05/31/22 1546 05/31/22 2023 05/31/22 2358 06/01/22 0426 06/01/22 0728  GLUCAP 179* 287* 263* 267* 294*   Lipid Profile: No results for input(s): "CHOL", "HDL", "LDLCALC", "TRIG", "CHOLHDL", "LDLDIRECT" in the last 72 hours. Thyroid Function Tests: No results for input(s): "TSH", "T4TOTAL", "FREET4", "T3FREE", "THYROIDAB" in the last 72 hours. Anemia Panel: No results for input(s): "VITAMINB12", "FOLATE", "FERRITIN", "TIBC", "IRON", "RETICCTPCT" in the last 72 hours. Sepsis Labs: Recent Labs  Lab 05/27/22 1332 05/27/22 1543  LATICACIDVEN 1.5 1.1    Recent Results (from the past 240 hour(s))  Culture, blood (Routine x 2)     Status: None   Collection Time: 05/27/22 11:52 AM   Specimen: BLOOD  Result Value Ref Range Status   Specimen Description BLOOD BLOOD RIGHT FOREARM  Final   Special Requests   Final    BOTTLES DRAWN AEROBIC AND ANAEROBIC Blood Culture adequate volume   Culture   Final    NO GROWTH 5 DAYS Performed at [facility],  [address] [place], [place] [address]    Report Status 06/01/2022 FINAL  Final  Culture, blood (Routine x 2)     Status: Abnormal   Collection Time: 05/27/22  1:33 PM  Specimen: BLOOD  Result Value Ref Range Status   Specimen Description   Final    BLOOD LEFT ANTECUBITAL Performed at [facility], [address], [place], [place] [address]    Special Requests   Final    BOTTLES DRAWN AEROBIC AND ANAEROBIC Blood Culture adequate volume Performed at [facility], [address] [place], [place] [address]    Culture  Setup Time   Final    GRAM POSITIVE COCCI IN BOTH AEROBIC AND ANAEROBIC BOTTLES Gram Stain Report Called to,Read Back By and Verified With: SULY VILLALOBOF AT 1329 BY SA ON 05/28/22 CRITICAL RESULT CALLED TO, READ BACK BY AND VERIFIED WITH: RN Tillman Sers [phone] @1931  FH Performed at [facility], [address], [place], [place]    Culture STREPTOCOCCUS CONSTELLATUS (A)  Final   Report Status 05/31/2022 FINAL  Final   Organism ID, Bacteria STREPTOCOCCUS CONSTELLATUS  Final      Susceptibility   Streptococcus constellatus - MIC*    PENICILLIN 0.12 SENSITIVE Sensitive     CEFTRIAXONE 0.5 SENSITIVE Sensitive     ERYTHROMYCIN <=0.12 SENSITIVE Sensitive     LEVOFLOXACIN 0.5 SENSITIVE Sensitive     VANCOMYCIN 0.5 SENSITIVE Sensitive     * STREPTOCOCCUS CONSTELLATUS  Blood Culture ID Panel (Reflexed)     Status: Abnormal   Collection Time: 05/27/22  1:33 PM  Result Value Ref Range Status   Enterococcus faecalis NOT DETECTED NOT DETECTED Final   Enterococcus Faecium NOT DETECTED NOT DETECTED Final   Listeria monocytogenes NOT DETECTED NOT DETECTED Final   Staphylococcus species NOT DETECTED NOT DETECTED Final   Staphylococcus aureus (BCID) NOT DETECTED NOT DETECTED Final   Staphylococcus epidermidis NOT DETECTED NOT DETECTED Final   Staphylococcus lugdunensis NOT DETECTED NOT DETECTED Final   Streptococcus species DETECTED (A) NOT DETECTED Final    Comment: Not  Enterococcus species, Streptococcus agalactiae, Streptococcus pyogenes, or Streptococcus pneumoniae. CRITICAL RESULT CALLED TO, READ BACK BY AND VERIFIED WITH: RN N. HEARP [phone] @1931  FH    Streptococcus agalactiae NOT DETECTED NOT DETECTED Final   Streptococcus pneumoniae NOT DETECTED NOT DETECTED Final   Streptococcus pyogenes NOT DETECTED NOT DETECTED Final   A.calcoaceticus-baumannii NOT DETECTED NOT DETECTED Final   Bacteroides fragilis NOT DETECTED NOT DETECTED Final   Enterobacterales NOT DETECTED NOT DETECTED Final   Enterobacter cloacae complex NOT DETECTED NOT DETECTED Final   Escherichia coli NOT DETECTED NOT DETECTED Final   Klebsiella aerogenes NOT DETECTED NOT DETECTED Final   Klebsiella oxytoca NOT DETECTED NOT DETECTED Final   Klebsiella pneumoniae NOT DETECTED NOT DETECTED Final   Proteus species NOT DETECTED NOT DETECTED Final   Salmonella species NOT DETECTED NOT DETECTED Final   Serratia marcescens NOT DETECTED NOT DETECTED Final   Haemophilus influenzae NOT DETECTED NOT DETECTED Final   Neisseria meningitidis NOT DETECTED NOT DETECTED Final   Pseudomonas aeruginosa NOT DETECTED NOT DETECTED Final   Stenotrophomonas maltophilia NOT DETECTED NOT DETECTED Final   Candida albicans NOT DETECTED NOT DETECTED Final   Candida auris NOT DETECTED NOT DETECTED Final   Candida glabrata NOT DETECTED NOT DETECTED Final   Candida krusei NOT DETECTED NOT DETECTED Final   Candida parapsilosis NOT DETECTED NOT DETECTED Final   Candida tropicalis NOT DETECTED NOT DETECTED Final   Cryptococcus neoformans/gattii NOT DETECTED NOT DETECTED Final    Comment: Performed at [facility], [address], [place], [address] [place]  Remove and replace urinary cath (placed > 5 days) then obtain  urine culture from new indwelling urinary catheter.     Status: None   Collection Time: 05/28/22  1:40 AM   Specimen: Urine, Catheterized  Result Value Ref Range Status   Specimen Description    Final    URINE, CATHETERIZED Performed at [facility], [address] [place], [place] [address]    Special Requests   Final    NONE Performed at [facility], [address] [place], [place] [address]    Culture   Final    NO GROWTH Performed at [facility], [address] [place], [place] [address]    Report Status 05/29/2022 FINAL  Final  Culture, blood (Routine X 2) w Reflex to ID Panel     Status: None (Preliminary result)   Collection Time: 05/29/22  5:50 AM   Specimen: Right Antecubital; Blood  Result Value Ref Range Status   Specimen Description RIGHT ANTECUBITAL BOTTLES DRAWN AEROBIC ONLY  Final   Special Requests Blood Culture adequate volume  Final   Culture   Final    NO GROWTH 3 DAYS Performed at [facility], [address], [place], [place] [address]    Report Status PENDING  Incomplete  Culture, blood (Routine X 2) w Reflex to ID Panel     Status: None (Preliminary result)   Collection Time: 05/29/22  6:04 AM   Specimen: BLOOD RIGHT HAND  Result Value Ref Range Status   Specimen Description BLOOD RIGHT HAND BOTTLES DRAWN AEROBIC ONLY  Final   Special Requests   Final    Blood Culture results may not be optimal due to an excessive volume of blood received in culture bottles Performed at [facility], [address], [place], [place] [address]    Culture PENDING  Incomplete   Report Status PENDING  Incomplete  Aerobic Culture w Gram Stain (superficial specimen)     Status: None (Preliminary result)   Collection Time: 05/29/22  1:13 PM   Specimen: Sacral  Result Value Ref Range Status   Specimen Description SACRAL  Final   Special Requests BONE BIOPSY  Final   Gram Stain   Final    NO SQUAMOUS EPITHELIAL CELLS SEEN FEW WBC SEEN MODERATE GRAM NEGATIVE RODS MODERATE GRAM POSITIVE COCCI FEW GRAM POSITIVE RODS    Culture   Final    FEW ENTEROCOCCUS FAECALIS RARE PROTEUS MIRABILIS SUSCEPTIBILITIES TO FOLLOW    Report Status PENDING  Incomplete    Organism ID, Bacteria PROTEUS MIRABILIS  Final      Susceptibility   Proteus mirabilis - MIC*    AMPICILLIN <=2 SENSITIVE Sensitive     CEFAZOLIN <=4 SENSITIVE Sensitive     CEFEPIME <=0.12 SENSITIVE Sensitive     CEFTAZIDIME <=1 SENSITIVE Sensitive     CEFTRIAXONE <=0.25 SENSITIVE Sensitive     CIPROFLOXACIN <=0.25 SENSITIVE Sensitive     GENTAMICIN <=1 SENSITIVE Sensitive     IMIPENEM 1 SENSITIVE Sensitive     TRIMETH/SULFA <=20 SENSITIVE Sensitive     AMPICILLIN/SULBACTAM <=2 SENSITIVE Sensitive     PIP/TAZO Value in next row Sensitive      <=4 SENSITIVEPerformed at [facility], [address], [place], [place] [address]    * RARE PROTEUS MIRABILIS         Radiology Studies: [place] EKG SITE RITE  Result Date: 05/31/2022 If Site Rite image not attached, placement could not be confirmed due to current cardiac rhythm.       Scheduled Meds:  ARIPiprazole  10  mg Per Tube q morning   benztropine  1 mg Per Tube BID   Chlorhexidine Gluconate Cloth  6 each Topical Q0600   feeding supplement (PROSource TF)  45 mL Per Tube BID   insulin aspart  0-9 Units Subcutaneous Q4H   liothyronine  10 mcg Per Tube Daily   nutrition supplement (JUVEN)  1 packet Per Tube BID BM   rivaroxaban  20 mg Oral Q supper   senna-docusate  2 tablet Oral BID   sodium chloride flush  10-40 mL Intracatheter Q12H   tamsulosin  0.4 mg Oral Daily   Continuous Infusions:  feeding supplement (VITAL 1.5 CAL) 1,000 mL (06/01/22 0355)   fluconazole (DIFLUCAN) IV     magnesium sulfate bolus IVPB     piperacillin-tazobactam (ZOSYN)  IV 3.375 g (06/01/22 0456)     LOS: 5 days    Time spent: 35 minutes    Sita Mangen Hoover Brunette, DO [facility]  If 7PM-7AM, please contact night-coverage [email or web address] 06/01/2022, 11:04 AM

## 2022-06-02 DIAGNOSIS — G9340 Encephalopathy, unspecified: Secondary | ICD-10-CM | POA: Diagnosis not present

## 2022-06-02 DIAGNOSIS — Z9989 Dependence on other enabling machines and devices: Secondary | ICD-10-CM

## 2022-06-02 DIAGNOSIS — G4733 Obstructive sleep apnea (adult) (pediatric): Secondary | ICD-10-CM

## 2022-06-02 DIAGNOSIS — E119 Type 2 diabetes mellitus without complications: Secondary | ICD-10-CM | POA: Diagnosis not present

## 2022-06-02 DIAGNOSIS — A419 Sepsis, unspecified organism: Secondary | ICD-10-CM | POA: Diagnosis not present

## 2022-06-02 DIAGNOSIS — B954 Other streptococcus as the cause of diseases classified elsewhere: Secondary | ICD-10-CM | POA: Diagnosis not present

## 2022-06-02 DIAGNOSIS — I5032 Chronic diastolic (congestive) heart failure: Secondary | ICD-10-CM

## 2022-06-02 DIAGNOSIS — M8618 Other acute osteomyelitis, other site: Secondary | ICD-10-CM | POA: Diagnosis not present

## 2022-06-02 DIAGNOSIS — R7881 Bacteremia: Secondary | ICD-10-CM | POA: Diagnosis not present

## 2022-06-02 LAB — CBC
HCT: 31.5 % — ABNORMAL LOW (ref 39.0–52.0)
Hemoglobin: 9.7 g/dL — ABNORMAL LOW (ref 13.0–17.0)
MCH: 26.9 pg (ref 26.0–34.0)
MCHC: 30.8 g/dL (ref 30.0–36.0)
MCV: 87.3 fL (ref 80.0–100.0)
Platelets: 314 10*3/uL (ref 150–400)
RBC: 3.61 MIL/uL — ABNORMAL LOW (ref 4.22–5.81)
RDW: 14.7 % (ref 11.5–15.5)
WBC: 6.7 10*3/uL (ref 4.0–10.5)
nRBC: 0 % (ref 0.0–0.2)

## 2022-06-02 LAB — SURGICAL PATHOLOGY

## 2022-06-02 LAB — AEROBIC CULTURE W GRAM STAIN (SUPERFICIAL SPECIMEN): Gram Stain: NONE SEEN

## 2022-06-02 LAB — MAGNESIUM: Magnesium: 1.7 mg/dL (ref 1.7–2.4)

## 2022-06-02 LAB — BASIC METABOLIC PANEL
Anion gap: 5 (ref 5–15)
BUN: 30 mg/dL — ABNORMAL HIGH (ref 8–23)
CO2: 26 mmol/L (ref 22–32)
Calcium: 9.1 mg/dL (ref 8.9–10.3)
Chloride: 101 mmol/L (ref 98–111)
Creatinine, Ser: 0.65 mg/dL (ref 0.61–1.24)
GFR, Estimated: >60 mL/min (ref >60)
Glucose, Bld: 306 mg/dL — ABNORMAL HIGH (ref 70–99)
Potassium: 4.3 mmol/L (ref 3.5–5.1)
Sodium: 132 mmol/L — ABNORMAL LOW (ref 135–145)

## 2022-06-02 LAB — GLUCOSE, CAPILLARY
Glucose-Capillary: 245 mg/dL — ABNORMAL HIGH (ref 70–99)
Glucose-Capillary: 252 mg/dL — ABNORMAL HIGH (ref 70–99)
Glucose-Capillary: 286 mg/dL — ABNORMAL HIGH (ref 70–99)
Glucose-Capillary: 292 mg/dL — ABNORMAL HIGH (ref 70–99)
Glucose-Capillary: 319 mg/dL — ABNORMAL HIGH (ref 70–99)
Glucose-Capillary: 329 mg/dL — ABNORMAL HIGH (ref 70–99)

## 2022-06-02 MED ORDER — SODIUM CHLORIDE 0.9 % IV SOLN
3.0000 g | Freq: Four times a day (QID) | INTRAVENOUS | Status: DC
  Administered 2022-06-02 – 2022-06-04 (×7): 3 g via INTRAVENOUS
  Filled 2022-06-02 (×7): qty 8

## 2022-06-02 MED ORDER — INSULIN ASPART 100 UNIT/ML IJ SOLN
0.0000 [IU] | INTRAMUSCULAR | Status: DC
  Administered 2022-06-02: 15 [IU] via SUBCUTANEOUS
  Administered 2022-06-02 (×2): 11 [IU] via SUBCUTANEOUS
  Administered 2022-06-02 – 2022-06-03 (×3): 7 [IU] via SUBCUTANEOUS
  Administered 2022-06-03 – 2022-06-04 (×5): 11 [IU] via SUBCUTANEOUS
  Administered 2022-06-04 (×2): 7 [IU] via SUBCUTANEOUS

## 2022-06-02 MED ORDER — AMOXICILLIN-POT CLAVULANATE 875-125 MG PO TABS
1.0000 | ORAL_TABLET | Freq: Two times a day (BID) | ORAL | Status: DC
Start: 2022-06-11 — End: 2022-06-04

## 2022-06-02 MED ORDER — INSULIN GLARGINE-YFGN 100 UNIT/ML ~~LOC~~ SOLN
15.0000 [IU] | Freq: Two times a day (BID) | SUBCUTANEOUS | Status: DC
  Administered 2022-06-02 (×2): 15 [IU] via SUBCUTANEOUS
  Filled 2022-06-02 (×5): qty 0.15

## 2022-06-02 NOTE — Care Management Important Message (Signed)
Important Message  Patient Details  Name: Christian [name] MRN: [id number] Date of Birth: [birth year]   Medicare Important Message Given:  Yes     [name] 06/02/2022, 2:11 PM

## 2022-06-02 NOTE — Progress Notes (Signed)
Pt has been asked multiple times on shift if this nurse could change his wound dressing on buttocks, pt has refused each time, techs present during interactions, latonya and [place]. Pt turned side to side using the bed controls on his air mattress.

## 2022-06-02 NOTE — Progress Notes (Signed)
Speech Language Pathology Treatment: Dysphagia  Patient Details Name: Christian [name] MRN: [id number] DOB: [birth year] Today's Date: 06/02/2022 Time: 3419-6222 SLP Time Calculation (min) (ACUTE ONLY): 14 min  Assessment / Plan / Recommendation Clinical Impression  SLP provided ongoing diagnostic dysphagia therapy targeting trials of thin liquids and ongoing education. Pt was repositioned in bed and was pleasant and cooperative. Pt consumed thin liquids via straw without overt s/sx of aspiration. Recommend continue with D1/thin diet. ST will follow for one more diet check to ensure tolerance. Thank you,   HPI HPI: Christian [name] is a 71 y.o. male with medical history significant for diabetes mellitus, CVA, bipolar disorder, coronary artery disease, PE and DVT, CKD 3, atrial fibrillation, OSA, gastrostomy tube and chronic Foley catheter. Patient reports to the ED from nursing home reports of altered mental status, increasing lethargy and fever, with tachycardia heart rate in the 130s.  Patient was admitted with concerns of severe sepsis in the setting of sacral decubitus ulcer which is now demonstrating findings of new osteomyelitis.  He is now noted to have strep bacteremia and ID is following.      SLP Plan  Continue with current plan of care      Recommendations for follow up therapy are one component of a multi-disciplinary discharge planning process, led by the attending physician.  Recommendations may be updated based on patient status, additional functional criteria and insurance authorization.    Recommendations  Diet recommendations: Thin liquid;Dysphagia 1 (puree) Liquids provided via: Straw;Cup Medication Administration: Crushed with puree Supervision: Full supervision/cueing for compensatory strategies;Trained caregiver to feed patient Compensations: Slow rate;Small sips/bites;Minimize environmental distractions Postural Changes and/or Swallow Maneuvers: Seated upright 90  degrees                Oral Care Recommendations: Oral care BID;Staff/trained caregiver to provide oral care Follow Up Recommendations: No SLP follow up Assistance recommended at discharge: Set up Supervision/Assistance SLP Visit Diagnosis: Dysphagia, unspecified (R13.10) Plan: Continue with current plan of care          Ieshia Hatcher H. Romie Levee, CCC-SLP Speech Language Pathologist  Georgetta Haber  06/02/2022, 3:32 PM

## 2022-06-02 NOTE — Progress Notes (Signed)
      [facility]  Date of Admission:  05/27/2022             ASSESSMENT:    71 y.o. male admitted with:  Strep constellatus bacteremia: 05/27/22 cultures positive and repeat blood cultures 05/29/22 NGTD.   Sacral decubitus ulcer and osteomyelitis: Patient presenting with sepsis on admission and CT scan showing osteomyelitis that was new from CT on 04/13/22.  Bone biopsy cultures are growing Proteus mirabilis and VRE with susceptibility to Unasyn.  Pathology pending.  RECOMMENDATIONS:    Switch zosyn to unasyn Low suspicion for endocarditis Continue wound care, off loading, keeping area as clean as possible Will do 2 weeks of IV therapy through 06/10/22 to treat the bacteremia Then transition to Augmentin for 4 more weeks to treat osteomyelitis through 07/08/22 Remove PICC line following IV treatment Will sign off, please call as needed      Vedia Coffer for Infectious Disease [facility] [phone] pager 06/02/2022, 10:45 AM  I spent greater than 5 min formulating plan, reviewing chart, and communicating with team.

## 2022-06-02 NOTE — Progress Notes (Signed)
Palliative: Chart review completed.  Christian [name] remains stable.  His chronic illness burden is very heavy.  It is anticipated that he would return to his long-term care center, [facility], 7/6.  He will need long-term IV antibiotic use and extensive wound care.  Plan: At this point continue full scope/full code.  No limits on treatment.  Return to long-term care, where he has been for the last few months, [facility] rehab.  Time for outcomes.  No charge Lillia Carmel, NP Palliative medicine team Team phone [phone] Greater than 50% of this time spent counseling and coordinating care related to the above assessment and plan.

## 2022-06-02 NOTE — Progress Notes (Signed)
PROGRESS NOTE    Christian [name]  WUJ:[id number] DOB: 20-Feb-1951 DOA: 05/27/2022 PCP: [name], MD   Brief Narrative:    Christian [name] is a 71 y.o. male with medical history significant for diabetes mellitus, CVA, bipolar disorder, coronary artery disease, PE and DVT, CKD 3, atrial fibrillation, OSA, gastrostomy tube and chronic Foley catheter. Patient reports to the ED from nursing home reports of altered mental status, increasing lethargy and fever, with tachycardia heart rate in the 130s.  Patient was admitted with concerns of severe sepsis in the setting of sacral decubitus ulcer which is now demonstrating findings of new osteomyelitis.  He is now noted to have strep constellatus bacteremia and ID is following.  Bone biopsy cultures with Enterococcus faecalis and Proteus with susceptibilities pending and patient remains on Zosyn.  He is also noted to have oropharyngeal candidiasis for which he has been started on Diflucan.  Assessment & Plan:   Principal Problem:   Severe sepsis (HCC) Active Problems:   Hypertension   Type 2 diabetes mellitus with complication, without long-term current use of insulin (HCC)   OSA on CPAP   Urinary tract infection associated with catheterization of urinary tract (HCC)   Diabetes mellitus type 2 in nonobese (HCC)   Pulmonary embolism (HCC)   Encephalopathy   Chronic diastolic CHF (congestive heart failure) (HCC)   Sacral decubitus ulcer, stage IV (HCC)   Osteomyelitis (HCC)  Assessment and Plan:  Severe sepsis, present on admission, with strep constellatus bacteremia and sacral osteomyelitis -Strep constellatus bacteremia noted and patient has been changed to IV Zosyn per ID with susceptibilities pending on bone biopsy with noted growth of Enterococcus faecalis and Proteus.  Need to ensure that there is no ESBL prior to discharge. -No need for TEE per cardiology/ID -PICC line placed 7/3    Sacral decubitus ulcer, stage IV  Large stage IV  sacral decubitus ulcer.  Patient nonambulatory.  Poor functional status and debility after 2 prolonged recent hospitalizations in April and then May to June.  CT suggesting osteomyelitis of the coccyx.  No abscess. -Appreciate debridement and possible bone biopsy with general surgery 7/1, culture susceptibilities pending   Encephalopathy-resolved Acute metabolic encephalopathy likely secondary to severe sepsis. -CT head with no acute findings -Resumed home psychoactive medications      Urinary tract infection associated with catheterization of urinary tract (HCC) -Although this was suspected on admission, seems less likely with no growth in urine culture -UA also collected from old Foley, which has not been exchanged   Pulmonary embolism (HCC) Resumed home Xarelto 7/2   Type 2 diabetes mellitus with complication, without long-term current use of insulin  Uncontrolled as evidenced by A1c 7.6% with ongoing hyperglycemia    -restarted home long-acting insulin 15 units twice daily - SSI- S CBG (last 3)  Recent Labs    06/02/22 0738 06/02/22 1120 06/02/22 1628  GLUCAP 329* 286* 245*     Hypertension Stable. -Resume home metoprolol -As needed labetalol for systolic greater than 170  Hypomagnesemia Replete and reevaluate in a.m.     DVT prophylaxis: Xarelto Code Status: Full Family Communication: None at bedside Disposition Plan:  Status is: Inpatient Remains inpatient appropriate because: IV medications     Skin Assessment:   I have examined the patient's skin and I agree with the wound assessment as performed by the wound care RN as outlined below:          Pressure Injury 03/31/22 Sacrum Mid Stage 4 - Full  thickness tissue loss with exposed bone, tendon or muscle. (Active)  03/31/22 1933  Location: Sacrum  Location Orientation: Mid  Staging: Stage 4 - Full thickness tissue loss with exposed bone, tendon or muscle.  Wound Description (Comments):   Present on  Admission: Yes  Dressing Type Gauze (Comment) 05/28/22 0000     Pressure Injury 05/08/22 Heel Right Deep Tissue Pressure Injury - Purple or maroon localized area of discolored intact skin or blood-filled blister due to damage of underlying soft tissue from pressure and/or shear. (Active)  05/08/22 1600  Location: Heel  Location Orientation: Right  Staging: Deep Tissue Pressure Injury - Purple or maroon localized area of discolored intact skin or blood-filled blister due to damage of underlying soft tissue from pressure and/or shear.  Wound Description (Comments):   Present on Admission: No  Dressing Type None 05/28/22 0000      Consultants:  Gen Surg Palliative care ID Cardiology   Procedures:  Bone biopsy to decubitus ulcer on 7/1 PICC placement 7/3    Antimicrobials:  Anti-infectives (From admission, onward)    Start     Dose/Rate Route Frequency Ordered Stop   06/11/22 1000  amoxicillin-clavulanate (AUGMENTIN) 875-125 MG per tablet 1 tablet        1 tablet Oral Every 12 hours 06/02/22 1351 07/09/22 0959   06/02/22 1400  Ampicillin-Sulbactam (UNASYN) 3 g in sodium chloride 0.9 % 100 mL IVPB        3 g 200 mL/hr over 30 Minutes Intravenous Every 6 hours 06/02/22 0934 06/11/22 0159   06/01/22 1800  fluconazole (DIFLUCAN) IVPB 100 mg        100 mg 50 mL/hr over 60 Minutes Intravenous Every 24 hours 05/31/22 1658     05/31/22 1745  fluconazole (DIFLUCAN) IVPB 200 mg        200 mg 100 mL/hr over 60 Minutes Intravenous  Once 05/31/22 1657 05/31/22 1840   05/31/22 1500  piperacillin-tazobactam (ZOSYN) IVPB 3.375 g  Status:  Discontinued        3.375 g 12.5 mL/hr over 240 Minutes Intravenous Every 8 hours 05/31/22 1409 06/02/22 0934   05/28/22 1800  vancomycin (VANCOREADY) IVPB 1750 mg/350 mL  Status:  Discontinued        1,750 mg 175 mL/hr over 120 Minutes Intravenous Every 24 hours 05/27/22 1753 05/31/22 1400   05/27/22 2345  metroNIDAZOLE (FLAGYL) IVPB 500 mg  Status:   Discontinued        500 mg 100 mL/hr over 60 Minutes Intravenous Every 12 hours 05/27/22 2337 05/31/22 1400   05/27/22 2200  ceFEPIme (MAXIPIME) 2 g in sodium chloride 0.9 % 100 mL IVPB  Status:  Discontinued        2 g 200 mL/hr over 30 Minutes Intravenous Every 8 hours 05/27/22 1750 05/31/22 1400   05/27/22 1800  vancomycin (VANCOREADY) IVPB 2000 mg/400 mL        2,000 mg 200 mL/hr over 120 Minutes Intravenous  Once 05/27/22 1750 05/27/22 1930   05/27/22 1700  cefTRIAXone (ROCEPHIN) 1 g in sodium chloride 0.9 % 100 mL IVPB        1 g 200 mL/hr over 30 Minutes Intravenous  Once 05/27/22 1651 05/27/22 1812      Subjective: No specific complaints today.  Objective: Vitals:   06/01/22 1355 06/01/22 1958 06/02/22 0417 06/02/22 1353  BP: 136/84 (!) 148/97 133/89 135/90  Pulse: 88 93 88 90  Resp: 19 18 (!) 21 19  Temp: 98.1 F (36.7 C)  97.6 F (36.4 C) 97.9 F (36.6 C) 97.8 F (36.6 C)  TempSrc: Oral Oral Oral Oral  SpO2: 100% 100% 100% 100%  Weight:      Height:        Intake/Output Summary (Last 24 hours) at 06/02/2022 1726 Last data filed at 06/02/2022 0600 Gross per 24 hour  Intake 2892.72 ml  Output 2200 ml  Net 692.72 ml   Filed Weights   05/27/22 1139 05/27/22 2300 05/29/22 1000  Weight: 136 kg 95.7 kg 101.8 kg    Examination:  General exam: awake, alert, cooperative, NAD.  Respiratory system: Clear to auscultation. Respiratory effort normal. Cardiovascular system: normal S1 & S2 heard, no m/r/g  Gastrointestinal system: Abdomen is soft, feeding tube C/D/I Central nervous system: no focal findings on exam Extremities: No edema Skin: noted large sacral decibitus Psychiatry: Flat affect.  Data Reviewed: I have personally reviewed following labs and imaging studies  CBC: Recent Labs  Lab 05/27/22 1332 05/28/22 0556 05/29/22 0551 05/30/22 0551 05/31/22 0609 06/01/22 0546 06/02/22 0529  WBC 8.9   < > 5.3 6.8 7.8 7.0 6.7  NEUTROABS 7.6  --   --   --   --    --   --   HGB 13.5   < > 11.0* 11.0* 10.1* 9.9* 9.7*  HCT 44.0   < > 35.9* 36.3* 32.8* 31.6* 31.5*  MCV 88.2   < > 90.0 90.1 89.1 87.3 87.3  PLT 399   < > 339 300 275 290 314   < > = values in this interval not displayed.   Basic Metabolic Panel: Recent Labs  Lab 05/29/22 0551 05/30/22 0551 05/31/22 0609 06/01/22 0546 06/02/22 0529  NA 138 141 134* 135 132*  K 4.1 4.5 4.2 4.1 4.3  CL 113* 111 109 108 101  CO2 20* 25 21* 24 26  GLUCOSE 249* 206* 157* 299* 306*  BUN 48* 41* 34* 32* 30*  CREATININE 0.94 0.86 0.73 0.77 0.65  CALCIUM 9.0 9.6 9.1 9.4 9.1  MG 2.2 1.9 1.7 1.6* 1.7   GFR: Estimated Creatinine Clearance: 109.1 mL/min (by C-G formula based on SCr of 0.65 mg/dL). Liver Function Tests: Recent Labs  Lab 05/27/22 1332 05/28/22 0556 05/29/22 0551 05/30/22 0551 05/31/22 0609  AST 50* 109* 54* 25 19  ALT 72* 100* 74* 53* 38  ALKPHOS 80 71 63 61 52  BILITOT 0.5 0.6 0.5 0.5 0.5  PROT 8.2* 7.5 6.8 7.1 6.5  ALBUMIN 2.6* 2.2* 2.0* 2.1* 2.0*   No results for input(s): "LIPASE", "AMYLASE" in the last 168 hours. No results for input(s): "AMMONIA" in the last 168 hours. Coagulation Profile: Recent Labs  Lab 05/27/22 1332  INR 1.4*   Cardiac Enzymes: No results for input(s): "CKTOTAL", "CKMB", "CKMBINDEX", "TROPONINI" in the last 168 hours. BNP (last 3 results) No results for input(s): "PROBNP" in the last 8760 hours. HbA1C: No results for input(s): "HGBA1C" in the last 72 hours. CBG: Recent Labs  Lab 06/02/22 0006 06/02/22 0358 06/02/22 0738 06/02/22 1120 06/02/22 1628  GLUCAP 319* 252* 329* 286* 245*   Lipid Profile: No results for input(s): "CHOL", "HDL", "LDLCALC", "TRIG", "CHOLHDL", "LDLDIRECT" in the last 72 hours. Thyroid Function Tests: No results for input(s): "TSH", "T4TOTAL", "FREET4", "T3FREE", "THYROIDAB" in the last 72 hours. Anemia Panel: No results for input(s): "VITAMINB12", "FOLATE", "FERRITIN", "TIBC", "IRON", "RETICCTPCT" in the last  72 hours. Sepsis Labs: Recent Labs  Lab 05/27/22 1332 05/27/22 1543  LATICACIDVEN 1.5 1.1    Recent Results (  from the past 240 hour(s))  Culture, blood (Routine x 2)     Status: None   Collection Time: 05/27/22 11:52 AM   Specimen: BLOOD  Result Value Ref Range Status   Specimen Description BLOOD BLOOD RIGHT FOREARM  Final   Special Requests   Final    BOTTLES DRAWN AEROBIC AND ANAEROBIC Blood Culture adequate volume   Culture   Final    NO GROWTH 5 DAYS Performed at [facility], [address], [place], [place] [address]    Report Status 06/01/2022 FINAL  Final  Culture, blood (Routine x 2)     Status: Abnormal   Collection Time: 05/27/22  1:33 PM   Specimen: BLOOD  Result Value Ref Range Status   Specimen Description   Final    BLOOD LEFT ANTECUBITAL Performed at [facility], [address] [place], [place] [address]    Special Requests   Final    BOTTLES DRAWN AEROBIC AND ANAEROBIC Blood Culture adequate volume Performed at [facility], [address] [place], [place] [address]    Culture  Setup Time   Final    GRAM POSITIVE COCCI IN BOTH AEROBIC AND ANAEROBIC BOTTLES Gram Stain Report Called to,Read Back By and Verified With: SULY VILLALOBOF AT 1329 BY SA ON 05/28/22 CRITICAL RESULT CALLED TO, READ BACK BY AND VERIFIED WITH: RN Tillman Sers [phone] @1931  FH Performed at [facility], [address] [place], [place]    Culture STREPTOCOCCUS CONSTELLATUS (A)  Final   Report Status 05/31/2022 FINAL  Final   Organism ID, Bacteria STREPTOCOCCUS CONSTELLATUS  Final      Susceptibility   Streptococcus constellatus - MIC*    PENICILLIN 0.12 SENSITIVE Sensitive     CEFTRIAXONE 0.5 SENSITIVE Sensitive     ERYTHROMYCIN <=0.12 SENSITIVE Sensitive     LEVOFLOXACIN 0.5 SENSITIVE Sensitive     VANCOMYCIN 0.5 SENSITIVE Sensitive     * STREPTOCOCCUS CONSTELLATUS  Blood Culture ID Panel (Reflexed)     Status: Abnormal   Collection Time: 05/27/22  1:33 PM  Result  Value Ref Range Status   Enterococcus faecalis NOT DETECTED NOT DETECTED Final   Enterococcus Faecium NOT DETECTED NOT DETECTED Final   Listeria monocytogenes NOT DETECTED NOT DETECTED Final   Staphylococcus species NOT DETECTED NOT DETECTED Final   Staphylococcus aureus (BCID) NOT DETECTED NOT DETECTED Final   Staphylococcus epidermidis NOT DETECTED NOT DETECTED Final   Staphylococcus lugdunensis NOT DETECTED NOT DETECTED Final   Streptococcus species DETECTED (A) NOT DETECTED Final    Comment: Not Enterococcus species, Streptococcus agalactiae, Streptococcus pyogenes, or Streptococcus pneumoniae. CRITICAL RESULT CALLED TO, READ BACK BY AND VERIFIED WITH: RN N07/01/23 [phone] @1931  FH    Streptococcus agalactiae NOT DETECTED NOT DETECTED Final   Streptococcus pneumoniae NOT DETECTED NOT DETECTED Final   Streptococcus pyogenes NOT DETECTED NOT DETECTED Final   A.calcoaceticus-baumannii NOT DETECTED NOT DETECTED Final   Bacteroides fragilis NOT DETECTED NOT DETECTED Final   Enterobacterales NOT DETECTED NOT DETECTED Final   Enterobacter cloacae complex NOT DETECTED NOT DETECTED Final   Escherichia coli NOT DETECTED NOT DETECTED Final   Klebsiella aerogenes NOT DETECTED NOT DETECTED Final   Klebsiella oxytoca NOT DETECTED NOT DETECTED Final   Klebsiella pneumoniae NOT DETECTED NOT DETECTED Final   Proteus species NOT DETECTED NOT DETECTED Final   Salmonella species NOT DETECTED NOT DETECTED Final   Serratia marcescens NOT DETECTED NOT DETECTED Final   Haemophilus influenzae NOT DETECTED NOT DETECTED Final  Neisseria meningitidis NOT DETECTED NOT DETECTED Final   Pseudomonas aeruginosa NOT DETECTED NOT DETECTED Final   Stenotrophomonas maltophilia NOT DETECTED NOT DETECTED Final   Candida albicans NOT DETECTED NOT DETECTED Final   Candida auris NOT DETECTED NOT DETECTED Final   Candida glabrata NOT DETECTED NOT DETECTED Final   Candida krusei NOT DETECTED NOT DETECTED Final   Candida  parapsilosis NOT DETECTED NOT DETECTED Final   Candida tropicalis NOT DETECTED NOT DETECTED Final   Cryptococcus neoformans/gattii NOT DETECTED NOT DETECTED Final    Comment: Performed at [facility], [address] [place], [place] [address]  Remove and replace urinary cath (placed > 5 days) then obtain urine culture from new indwelling urinary catheter.     Status: None   Collection Time: 05/28/22  1:40 AM   Specimen: Urine, Catheterized  Result Value Ref Range Status   Specimen Description   Final    URINE, CATHETERIZED Performed at [facility], [address], [place], [place] [address]    Special Requests   Final    NONE Performed at [facility], [address] [place], [place] [address]    Culture   Final    NO GROWTH Performed at [facility], [address] [place], [place] [address]    Report Status 05/29/2022 FINAL  Final  Culture, blood (Routine X 2) w Reflex to ID Panel     Status: None (Preliminary result)   Collection Time: 05/29/22  5:50 AM   Specimen: Right Antecubital; Blood  Result Value Ref Range Status   Specimen Description RIGHT ANTECUBITAL BOTTLES DRAWN AEROBIC ONLY  Final   Special Requests Blood Culture adequate volume  Final   Culture   Final    NO GROWTH 4 DAYS Performed at [facility], [address], [place], [place] [address]    Report Status PENDING  Incomplete  Culture, blood (Routine X 2) w Reflex to ID Panel     Status: None (Preliminary result)   Collection Time: 05/29/22  6:04 AM   Specimen: BLOOD RIGHT HAND  Result Value Ref Range Status   Specimen Description BLOOD RIGHT HAND BOTTLES DRAWN AEROBIC ONLY  Final   Special Requests   Final    Blood Culture results may not be optimal due to an excessive volume of blood received in culture bottles Performed at [facility], [address], [place], [place] [address]    Culture PENDING  Incomplete   Report Status PENDING  Incomplete  Aerobic Culture w Gram Stain (superficial  specimen)     Status: None   Collection Time: 05/29/22  1:13 PM   Specimen: Sacral  Result Value Ref Range Status   Specimen Description   Final    SACRAL Performed at [facility], [address] [place], [place] [address]    Special Requests   Final    BONE BIOPSY Performed at [facility], [address] [place], [place] [address]    Gram Stain   Final    NO SQUAMOUS EPITHELIAL CELLS SEEN FEW WBC SEEN MODERATE GRAM NEGATIVE RODS MODERATE GRAM POSITIVE COCCI FEW GRAM POSITIVE RODS Performed at [facility], [address] [place], [place] [address]    Culture   Final    FEW VANCOMYCIN RESISTANT ENTEROCOCCUS RARE PROTEUS MIRABILIS    Report Status 06/02/2022 FINAL  Final   Organism ID, Bacteria PROTEUS MIRABILIS  Final   Organism ID, Bacteria VANCOMYCIN RESISTANT ENTEROCOCCUS  Final  Susceptibility   Proteus mirabilis - MIC*    AMPICILLIN <=2 SENSITIVE Sensitive     CEFAZOLIN <=4 SENSITIVE Sensitive     CEFEPIME <=0.12 SENSITIVE Sensitive     CEFTAZIDIME <=1 SENSITIVE Sensitive     CEFTRIAXONE <=0.25 SENSITIVE Sensitive     CIPROFLOXACIN <=0.25 SENSITIVE Sensitive     GENTAMICIN <=1 SENSITIVE Sensitive     IMIPENEM 1 SENSITIVE Sensitive     TRIMETH/SULFA <=20 SENSITIVE Sensitive     AMPICILLIN/SULBACTAM <=2 SENSITIVE Sensitive     PIP/TAZO <=4 SENSITIVE Sensitive     * RARE PROTEUS MIRABILIS   Vancomycin resistant enterococcus - MIC*    AMPICILLIN <=2 SENSITIVE Sensitive     VANCOMYCIN >=32 RESISTANT Resistant     GENTAMICIN SYNERGY SENSITIVE Sensitive     LINEZOLID 2 SENSITIVE Sensitive     * FEW VANCOMYCIN RESISTANT ENTEROCOCCUS    Radiology Studies: No results found.  Scheduled Meds:  [START ON 06/11/2022] amoxicillin-clavulanate  1 tablet Oral Q12H   ARIPiprazole  10 mg Per Tube q morning   benztropine  1 mg Per Tube BID   Chlorhexidine Gluconate Cloth  6 each Topical Q0600   feeding supplement (PROSource TF)  45 mL Per Tube BID   insulin aspart   0-20 Units Subcutaneous Q4H   insulin glargine-yfgn  15 Units Subcutaneous BID   liothyronine  10 mcg Per Tube Daily   metoprolol succinate  50 mg Oral Daily   nutrition supplement (JUVEN)  1 packet Per Tube BID BM   rivaroxaban  20 mg Oral Q supper   senna-docusate  2 tablet Oral BID   sodium chloride flush  10-40 mL Intracatheter Q12H   tamsulosin  0.4 mg Oral Daily   Continuous Infusions:  ampicillin-sulbactam (UNASYN) IV 3 g (06/02/22 1416)   feeding supplement (VITAL 1.5 CAL) 65 mL/hr at 06/02/22 0424   fluconazole (DIFLUCAN) IV 100 mg (06/02/22 1717)     LOS: 6 days   Time spent: 35 minutes  Allyanna Appleman Laural Benes, MD How to contact the [facility] Attending or Consulting provider 7A - 7P or covering provider during after hours 7P -7A, for this patient?  Check the care team in [facility] and look for a) attending/consulting TRH provider listed and b) the [facility] team listed Log into [email or web address] and use [facility]'s universal password to access. If you do not have the password, please contact the hospital operator. Locate the [facility] provider you are looking for under [facility] and page to a number that you can be directly reached. If you still have difficulty reaching the provider, please page the [facility] (Director on Call) for the Hospitalists listed on amion for assistance.  [facility]  If 7PM-7AM, please contact night-coverage [email or web address] 06/02/2022, 5:26 PM

## 2022-06-02 NOTE — TOC Progression Note (Signed)
Transition of Care [facility]) - Progression Note    Patient Details  Name: KRISTIAN MOGG MRN: [id number] Date of Birth: [birth year]  Transition of [facility] CM/SW Contact  Elliot Gault, LCSW Phone Number: 06/02/2022, 1:19 PM  Clinical Narrative:     TOC following. Pt has ins auth for SNF. MD anticipating dc tomorrow. Updated Jill Side at [facility].   Will follow up tomorrow.  Expected Discharge Plan: Skilled Nursing Facility Barriers to Discharge: Continued Medical Work up  Expected Discharge Plan and Services Expected Discharge Plan: Skilled Nursing Facility     Post Acute Care Choice: Skilled Nursing Facility Living arrangements for the past 2 months: Skilled Nursing Facility                                       Social Determinants of Health (SDOH) Interventions    Readmission Risk Interventions    05/28/2022   12:57 PM 05/14/2022   12:39 PM 04/13/2022    3:06 PM  Readmission Risk Prevention Plan  Transportation Screening Complete Complete   PCP or Specialist Appt within 3-5 Days Complete  Complete  HRI or Home Care Consult Complete  Complete  Social Work Consult for Recovery Care Planning/Counseling   Complete  Palliative Care Screening Not Complete  Not Applicable  Medication Review [employer]) Complete Complete Referral to Pharmacy  PCP or Specialist appointment within 3-5 days of discharge  Complete   HRI or Home Care Consult  Complete   Palliative Care Screening  Not Applicable   Skilled Nursing Facility  Complete

## 2022-06-03 DIAGNOSIS — Z515 Encounter for palliative care: Secondary | ICD-10-CM | POA: Diagnosis not present

## 2022-06-03 DIAGNOSIS — Z7189 Other specified counseling: Secondary | ICD-10-CM | POA: Diagnosis not present

## 2022-06-03 DIAGNOSIS — E119 Type 2 diabetes mellitus without complications: Secondary | ICD-10-CM | POA: Diagnosis not present

## 2022-06-03 DIAGNOSIS — M869 Osteomyelitis, unspecified: Secondary | ICD-10-CM | POA: Diagnosis not present

## 2022-06-03 DIAGNOSIS — E118 Type 2 diabetes mellitus with unspecified complications: Secondary | ICD-10-CM

## 2022-06-03 DIAGNOSIS — I5032 Chronic diastolic (congestive) heart failure: Secondary | ICD-10-CM | POA: Diagnosis not present

## 2022-06-03 DIAGNOSIS — A419 Sepsis, unspecified organism: Secondary | ICD-10-CM | POA: Diagnosis not present

## 2022-06-03 LAB — GLUCOSE, CAPILLARY
Glucose-Capillary: 202 mg/dL — ABNORMAL HIGH (ref 70–99)
Glucose-Capillary: 227 mg/dL — ABNORMAL HIGH (ref 70–99)
Glucose-Capillary: 246 mg/dL — ABNORMAL HIGH (ref 70–99)
Glucose-Capillary: 251 mg/dL — ABNORMAL HIGH (ref 70–99)
Glucose-Capillary: 253 mg/dL — ABNORMAL HIGH (ref 70–99)
Glucose-Capillary: 278 mg/dL — ABNORMAL HIGH (ref 70–99)
Glucose-Capillary: 286 mg/dL — ABNORMAL HIGH (ref 70–99)

## 2022-06-03 LAB — CULTURE, BLOOD (ROUTINE X 2)
Culture: NO GROWTH
Culture: NO GROWTH
Special Requests: ADEQUATE

## 2022-06-03 MED ORDER — GLUCERNA 1.5 CAL PO LIQD: 1000.0000 mL | ORAL | Status: AC

## 2022-06-03 MED ORDER — INSULIN GLARGINE-YFGN 100 UNIT/ML ~~LOC~~ SOLN: 45.0000 [IU] | Freq: Every day | SUBCUTANEOUS | 0 refills | Status: DC

## 2022-06-03 MED ORDER — INSULIN GLARGINE-YFGN 100 UNIT/ML ~~LOC~~ SOLN
40.0000 [IU] | Freq: Every day | SUBCUTANEOUS | Status: DC
  Administered 2022-06-03 – 2022-06-04 (×2): 40 [IU] via SUBCUTANEOUS
  Filled 2022-06-03 (×3): qty 0.4

## 2022-06-03 MED ORDER — ACETAMINOPHEN 500 MG PO TABS: 1000.0000 mg | ORAL_TABLET | Freq: Four times a day (QID) | ORAL | 0 refills | Status: AC | PRN

## 2022-06-03 MED ORDER — POLYETHYLENE GLYCOL 3350 17 GM/SCOOP PO POWD
17.0000 g | Freq: Every day | ORAL | Status: AC | PRN
Start: 2022-06-03 — End: ?

## 2022-06-03 MED ORDER — INSULIN GLARGINE-YFGN 100 UNIT/ML ~~LOC~~ SOLN
18.0000 [IU] | Freq: Two times a day (BID) | SUBCUTANEOUS | Status: DC
Start: 2022-06-03 — End: 2022-06-03

## 2022-06-03 MED ORDER — INSULIN NPH (HUMAN) (ISOPHANE) 100 UNIT/ML ~~LOC~~ SUSP: 5.0000 [IU] | Freq: Two times a day (BID) | SUBCUTANEOUS | Status: DC

## 2022-06-03 MED ORDER — INSULIN ASPART 100 UNIT/ML IJ SOLN
0.0000 [IU] | Freq: Four times a day (QID) | INTRAMUSCULAR | 11 refills | Status: AC
Start: 2022-06-03 — End: ?

## 2022-06-03 MED ORDER — AMPICILLIN-SULBACTAM IV (FOR PTA / DISCHARGE USE ONLY): 3.0000 g | Freq: Four times a day (QID) | INTRAVENOUS | 0 refills | Status: AC

## 2022-06-03 MED ORDER — AMOXICILLIN-POT CLAVULANATE 875-125 MG PO TABS: 1.0000 | ORAL_TABLET | Freq: Two times a day (BID) | ORAL | 0 refills | Status: AC

## 2022-06-03 NOTE — Discharge Instructions (Addendum)
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 Protect the skin surrounding the wound with a Cavillon skin barrier film wipe and allow to dry completely. Fill defect with saline moistened Kerlix roll gauze, using a cotton-tipped applicator to fill the area going towards the anus and left buttock. Cover with dry gauze, ABD pad, Secure with paper or Medipore tape. Change twice daily and prn soiling.   Wound care to right heel DTPI (reabsorbing blood blister):  Cleanse with NS, pat dry. Cover with silicone foam dressing for heel. Change every 3 days. Place foot (feet) into pressure redistribution heel boot (Prevalon).    IMPORTANT INFORMATION: PAY CLOSE ATTENTION   PHYSICIAN DISCHARGE INSTRUCTIONS  Follow with Primary care provider  [name], MD  and other consultants as instructed by your Hospitalist Physician  SEEK MEDICAL CARE OR RETURN TO EMERGENCY ROOM IF SYMPTOMS COME BACK, WORSEN OR NEW PROBLEM DEVELOPS   Please note: You were cared for by a hospitalist during your hospital stay. Every effort will be made to forward records to your primary care provider.  You can request that your primary care provider send for your hospital records if they have not received them.  Once you are discharged, your primary care physician will handle any further medical issues. Please note that NO REFILLS for any discharge medications will be authorized once you are discharged, as it is imperative that you return to your primary care physician (or establish a relationship with a primary care physician if you do not have one) for your post  hospital discharge needs so that they can reassess your need for medications and monitor your lab values.  Please get a complete blood count and chemistry panel checked by your Primary MD at your next visit, and again as instructed by your Primary MD.  Get Medicines reviewed and adjusted: Please take all your medications with you for your next visit with your Primary MD  Laboratory/radiological data: Please request your Primary MD to go over all hospital tests and procedure/radiological results at the follow up, please ask your primary care provider to get all Hospital records sent to his/her office.  In some cases, they will be blood work, cultures and biopsy results pending at the time of your discharge. Please request that your primary care provider follow up on these results.  If you are diabetic, please bring your blood sugar readings with you to your follow up appointment with primary care.    Please call and make your follow up appointments as soon as possible.    Also Note the following: If you experience worsening of your admission symptoms, develop shortness of breath, life threatening emergency, suicidal or homicidal thoughts you must seek medical attention immediately by calling 911 or calling your MD immediately  if symptoms less severe.  You must read complete instructions/literature along with all the possible adverse reactions/side effects for all the Medicines you take and that have been prescribed to you. Take any new Medicines after you have completely understood and accpet all the possible adverse reactions/side effects.   Do not drive when taking Pain medications or sleeping medications (Benzodiazepines)  Do  not take more than prescribed Pain, Sleep and Anxiety Medications. It is not advisable to combine anxiety,sleep and pain medications without talking with your primary care practitioner  Special Instructions: If you have smoked or chewed Tobacco  in the last 2 yrs  please stop smoking, stop any regular Alcohol  and or any Recreational drug use.  Wear Seat belts while driving.  Do not drive if taking any narcotic, mind altering or controlled substances or recreational drugs or alcohol.

## 2022-06-03 NOTE — Discharge Summary (Signed)
Physician Discharge Summary  Christian [name] BPZ:[id number] DOB: 02/26/1951 DOA: 05/27/2022  PCP: Donald Prose, MD  Admit date: 05/27/2022 Discharge date: 06/03/2022  Admitted From:  LTC SNF Disposition:  LTC SNF   Recommendations for Outpatient Follow-up:  Continue wound care, off loading, keeping area as clean as possible Complete 2 weeks of IV unasyn therapy through 06/10/22 to treat the bacteremia Then transition to Augmentin for 4 more weeks to treat osteomyelitis through 07/08/22 Remove PICC line following IV treatment Please monitor blood sugar 4 times per day and use SSI coverage every 6 hours   Wound care to chronic, nonhealing sacral Stage 4 pressure injury:  Cleanse with NS, pat gently dry. Protect the skin surrounding the wound with a [place] skin barrier film wipe and allow to dry completely. Fill defect with saline moistened Kerlix roll gauze, using a cotton-tipped applicator to fill the area going towards the anus and left buttock. Cover with dry gauze, ABD pad, Secure with paper or Medipore tape. Change twice daily and prn soiling.   Wound care to right heel DTPI (reabsorbing blood blister):  Cleanse with NS, pat dry. Cover with silicone foam dressing for heel. Change every 3 days. Place foot (feet) into pressure redistribution heel boot (Prevalon).  Discharge Condition: STABLE   CODE STATUS: FULL  DIET: Dysphagia 1 diet, thin liquids   Brief Hospitalization Summary: Please see all hospital notes, images, labs for full details of the hospitalization. ADMISSION HPI:    Christian [name] is a 71 y.o. male with medical history significant for diabetes mellitus, CVA, bipolar disorder, coronary artery disease, PE and DVT, CKD 3, atrial fibrillation, OSA, gastrostomy tube and chronic Foley catheter. Patient reports to the ED from nursing home reports of altered mental status, increasing lethargy and fever, with tachycardia heart rate in the 130s.  Patient was admitted with concerns  of severe sepsis in the setting of sacral decubitus ulcer which is now demonstrating findings of new osteomyelitis.  He is now noted to have strep constellatus bacteremia and ID is following.  Bone biopsy cultures with Enterococcus faecalis and Proteus with susceptibilities pending and patient remains on Zosyn.  He is also noted to have oropharyngeal candidiasis for which he has been started on Diflucan.   Assessment & Plan:   Principal Problem:   Severe sepsis  Active Problems:   Hypertension   Type 2 diabetes mellitus with complication, without long-term current use of insulin    OSA on CPAP   Urinary tract infection associated with catheterization of urinary tract    Diabetes mellitus type 2 in nonobese   Pulmonary embolism    Encephalopathy   Chronic diastolic CHF (congestive heart failure)    Sacral decubitus ulcer, stage IV    Osteomyelitis   Assessment and Plan:   Severe sepsis, present on admission, with strep constellatus bacteremia and sacral osteomyelitis -Strep constellatus bacteremia noted and patient has been changed to IV Unasyn per ID with plan to continue IV therapy thru 7/13 then transition to augmentin thru 07/08/22 to treat osteomyelitis. -No need for TEE per cardiology/ID -PICC line placed 7/3.  REMOVE PICC Line Following IV treatment.     Sacral decubitus ulcer, stage IV  Large stage IV sacral decubitus ulcer.  Patient nonambulatory.  Poor functional status and debility after 2 prolonged recent hospitalizations in April and then May to June.  CT suggesting osteomyelitis of the coccyx.  No abscess. -Appreciate debridement and bone biopsy with general surgery 7/1  -biopsy results c/w osteomyelitis  and osteonecrosis    Encephalopathy-resolved Acute metabolic encephalopathy likely secondary to severe sepsis. -CT head with no acute findings -Resumed home psychoactive medications     Urinary tract infection associated with catheterization of urinary tract  (HCC) -Although this was suspected on admission, seems less likely with no growth in urine culture -UA also collected from old Foley, which has not been exchanged   Pulmonary embolism ([place]) Resumed home Xarelto 7/2   Type 2 diabetes mellitus with complication, without long-term current use of insulin  Uncontrolled as evidenced by A1c 7.6% with ongoing hyperglycemia    -restarted home long-acting insulin 45 units glargine daily - SSI- S -recommend to check BS 4 times per day and use SSI coverage Q6 hours   Hypertension Stable. -Resume home metoprolol   Hypomagnesemia Repleted   Discharge Diagnoses:  Principal Problem:   Severe sepsis ([place]) Active Problems:   Hypertension   Type 2 diabetes mellitus with complication, without long-term current use of insulin (HCC)   OSA on CPAP   Urinary tract infection associated with catheterization of urinary tract (HCC)   Diabetes mellitus type 2 in nonobese (HCC)   Pulmonary embolism (HCC)   Encephalopathy   Chronic diastolic CHF (congestive heart failure) ([place])   Sacral decubitus ulcer, stage IV ([place])   Osteomyelitis [facility])   Discharge Instructions: Discharge Instructions     [facility] pharmacist to adjust dose for Vancomycin, Aminoglycosides and other anti-infective therapies as requested by physician.   Complete by: As directed    [facility] to provide Cath Flo 95m   Complete by: As directed    Administer for PICC line occlusion and as ordered by physician for other access device issues.   Anaphylaxis Kit: Provided to treat any anaphylactic reaction to the medication being provided to the patient if First Dose or when requested by physician   Complete by: As directed    Epinephrine 142mml vial / amp: Administer 0.66m21m0.66ml71mubcutaneously once for moderate to severe anaphylaxis, nurse to call physician and pharmacy when reaction occurs and call 911 if needed for immediate care   Diphenhydramine 50mg42mIV  vial: Administer 25-50mg 51mM PRN for first dose reaction, rash, itching, mild reaction, nurse to call physician and pharmacy when reaction occurs   Sodium Chloride 0.9% NS 500ml I666mdminister if needed for hypovolemic blood pressure drop or as ordered by physician after call to physician with anaphylactic reaction   Change dressing on IV access line weekly and PRN   Complete by: As directed    Flush IV access with Sodium Chloride 0.9% and Heparin 10 units/ml or 100 units/ml   Complete by: As directed    Home infusion instructions - [facility]   Complete by: As directed    Instructions: Flush IV access with Sodium Chloride 0.9% and Heparin 10units/ml or 100units/ml   Change dressing on IV access line: Weekly and PRN   Instructions Cath Flo 2mg: Ad37mister for PICC Line occlusion and as ordered by physician for other access device   [facility] pharmacist to adjust dose for: Vancomycin, Aminoglycosides and other anti-infective therapies as requested by physician   Method of administration may be changed at the discretion of home infusion pharmacist based upon assessment of the patient and/or caregiver's ability to self-administer the medication ordered   Complete by: As directed       Allergies as of 06/03/2022       Reactions   Amlodipine Swelling, Other (See Comments)  Legs became swollen   Onglyza [saxagliptin] Other (See Comments)   "Allergic," per [place] [olanzapine] Other (See Comments)   Allergy noted by ACT Team. No reaction specified (??)- "Allergic," per [place]   Ritalin [methylphenidate] Anxiety, Other (See Comments)   "[place]"        Medication List     STOP taking these medications    HYDROcodone-acetaminophen 5-325 MG tablet Commonly known as: NORCO/VICODIN       TAKE these medications    acetaminophen 500 MG tablet Commonly known as: TYLENOL Take 2 tablets (1,000 mg total) by mouth every 6 (six)  hours as needed for mild pain, fever or headache (pain.). What changed:  when to take this reasons to take this   amoxicillin-clavulanate 875-125 MG tablet Commonly known as: AUGMENTIN Take 1 tablet by mouth every 12 (twelve) hours for 27 days. Start taking on: June 11, 2022   ampicillin-sulbactam  IVPB Commonly known as: UNASYN Inject 3 g into the vein every 6 (six) hours for 8 days. Indication:  Bacteremia First Dose: No Last Day of Therapy:  06/11/2022 Labs - Once weekly:  CBC/D and BMP, Labs - Every other week:  ESR and CRP Method of administration: Mini-Bag Plus / Gravity Method of administration may be changed at the discretion of home infusion pharmacist based upon assessment of the patient and/or caregiver's ability to self-administer the medication ordered.   ARIPiprazole 10 MG tablet Commonly known as: ABILIFY Take 1 tablet (10 mg total) by mouth every morning.   atorvastatin 10 MG tablet Commonly known as: LIPITOR Take 1 tablet (10 mg total) by mouth daily at 6 PM. What changed: when to take this   benztropine 1 MG tablet Commonly known as: COGENTIN Take 1 tablet (1 mg total) by mouth 2 (two) times daily.   bisacodyl 5 MG EC tablet Commonly known as: DULCOLAX Take 2 tablets (10 mg total) by mouth daily as needed for moderate constipation.   feeding supplement (PROSource TF) liquid Place 45 mLs into feeding tube 3 (three) times daily. What changed: Another medication with the same name was changed. Make sure you understand how and when to take each.   feeding supplement (GLUCERNA 1.5 CAL) Liqd Place 1,000 mLs into feeding tube continuous. 1,000 mL, Per Tube, at 65 mL/hr, Continuous What changed:  when to take this additional instructions   free water Soln Place 150 mLs into feeding tube every 6 (six) hours.   insulin aspart 100 UNIT/ML FlexPen Commonly known as: NOVOLOG Before each meal 3 times a day, 140-199 - 2 units, 200-250 - 4 units, 251-299 - 6  units,  300-349 - 8 units,  350 or above 10 units. Insulin PEN if approved, provide syringes and needles if needed. What changed: Another medication with the same name was added. Make sure you understand how and when to take each.   insulin aspart 100 UNIT/ML injection Commonly known as: novoLOG Inject 0-20 Units into the skin every 6 (six) hours. What changed: You were already taking a medication with the same name, and this prescription was added. Make sure you understand how and when to take each.   insulin glargine-yfgn 100 UNIT/ML injection Commonly known as: SEMGLEE Inject 0.45 mLs (45 Units total) into the skin daily. What changed:  how much to take when to take this   liothyronine 5 MCG tablet Commonly known as: CYTOMEL Take 10 mcg by mouth daily.   Melatonin 10 MG Tabs Take 10  mg by mouth at bedtime as needed (sleep).   metoprolol succinate 50 MG 24 hr tablet Commonly known as: TOPROL-XL Take 1 tablet (50 mg total) by mouth daily. Take with or immediately following a meal. What changed: additional instructions   polyethylene glycol powder 17 GM/SCOOP powder Commonly known as: GLYCOLAX/MIRALAX Take 17 g by mouth daily as needed for mild constipation.   rivaroxaban 20 MG Tabs tablet Commonly known as: XARELTO Take 1 tablet (20 mg total) by mouth daily with supper. What changed: when to take this   senna-docusate 8.6-50 MG tablet Commonly known as: Senokot-S Take 2 tablets by mouth 2 (two) times daily.   tamsulosin 0.4 MG Caps capsule Commonly known as: FLOMAX Take 1 capsule (0.4 mg total) by mouth daily.   Vitamin D (Ergocalciferol) 1.25 MG (50000 UNIT) Caps capsule Commonly known as: DRISDOL Take 50,000 Units by mouth every 7 (seven) days.               Discharge Care Instructions  (From admission, onward)           Start     Ordered   06/03/22 0000  Change dressing on IV access line weekly and PRN  (Home infusion instructions - [facility] )        06/03/22 1414            Contact information for after-discharge care     [facility] .   Service: Skilled Nursing Contact information: [address] [place] [phone]                    Allergies  Allergen Reactions   Amlodipine Swelling and Other (See Comments)    Legs became swollen   Onglyza [Saxagliptin] Other (See Comments)    "Allergic," per [place]   Zyprexa [Olanzapine] Other (See Comments)    Allergy noted by ACT Team. No reaction specified (??)- "Allergic," per [place]   Ritalin [Methylphenidate] Anxiety and Other (See Comments)    "[place]"   Allergies as of 06/03/2022       Reactions   Amlodipine Swelling, Other (See Comments)   Legs became swollen   Onglyza [saxagliptin] Other (See Comments)   "Allergic," per [place]   Zyprexa [olanzapine] Other (See Comments)   Allergy noted by ACT Team. No reaction specified (??)- "Allergic," per [place]   Ritalin [methylphenidate] Anxiety, Other (See Comments)   "[place]"        Medication List     STOP taking these medications    HYDROcodone-acetaminophen 5-325 MG tablet Commonly known as: NORCO/VICODIN       TAKE these medications    acetaminophen 500 MG tablet Commonly known as: TYLENOL Take 2 tablets (1,000 mg total) by mouth every 6 (six) hours as needed for mild pain, fever or headache (pain.). What changed:  when to take this reasons to take this   amoxicillin-clavulanate 875-125 MG tablet Commonly known as: AUGMENTIN Take 1 tablet by mouth every 12 (twelve) hours for 27 days. Start taking on: June 11, 2022   ampicillin-sulbactam  IVPB Commonly known as: UNASYN Inject 3 g into the vein every 6 (six) hours for 8 days. Indication:  Bacteremia First Dose: No Last Day of Therapy:  06/11/2022 Labs - Once weekly:  CBC/D and BMP, Labs  - Every other week:  ESR and CRP Method of administration: Mini-Bag Plus /  Gravity Method of administration may be changed at the discretion of home infusion pharmacist based upon assessment of the patient and/or caregiver's ability to self-administer the medication ordered.   ARIPiprazole 10 MG tablet Commonly known as: ABILIFY Take 1 tablet (10 mg total) by mouth every morning.   atorvastatin 10 MG tablet Commonly known as: LIPITOR Take 1 tablet (10 mg total) by mouth daily at 6 PM. What changed: when to take this   benztropine 1 MG tablet Commonly known as: COGENTIN Take 1 tablet (1 mg total) by mouth 2 (two) times daily.   bisacodyl 5 MG EC tablet Commonly known as: DULCOLAX Take 2 tablets (10 mg total) by mouth daily as needed for moderate constipation.   feeding supplement (PROSource TF) liquid Place 45 mLs into feeding tube 3 (three) times daily. What changed: Another medication with the same name was changed. Make sure you understand how and when to take each.   feeding supplement (GLUCERNA 1.5 CAL) Liqd Place 1,000 mLs into feeding tube continuous. 1,000 mL, Per Tube, at 65 mL/hr, Continuous What changed:  when to take this additional instructions   free water Soln Place 150 mLs into feeding tube every 6 (six) hours.   insulin aspart 100 UNIT/ML FlexPen Commonly known as: NOVOLOG Before each meal 3 times a day, 140-199 - 2 units, 200-250 - 4 units, 251-299 - 6 units,  300-349 - 8 units,  350 or above 10 units. Insulin PEN if approved, provide syringes and needles if needed. What changed: Another medication with the same name was added. Make sure you understand how and when to take each.   insulin aspart 100 UNIT/ML injection Commonly known as: novoLOG Inject 0-20 Units into the skin every 6 (six) hours. What changed: You were already taking a medication with the same name, and this prescription was added. Make sure you understand how and when to take each.    insulin glargine-yfgn 100 UNIT/ML injection Commonly known as: SEMGLEE Inject 0.45 mLs (45 Units total) into the skin daily. What changed:  how much to take when to take this   liothyronine 5 MCG tablet Commonly known as: CYTOMEL Take 10 mcg by mouth daily.   Melatonin 10 MG Tabs Take 10 mg by mouth at bedtime as needed (sleep).   metoprolol succinate 50 MG 24 hr tablet Commonly known as: TOPROL-XL Take 1 tablet (50 mg total) by mouth daily. Take with or immediately following a meal. What changed: additional instructions   polyethylene glycol powder 17 GM/SCOOP powder Commonly known as: GLYCOLAX/MIRALAX Take 17 g by mouth daily as needed for mild constipation.   rivaroxaban 20 MG Tabs tablet Commonly known as: XARELTO Take 1 tablet (20 mg total) by mouth daily with supper. What changed: when to take this   senna-docusate 8.6-50 MG tablet Commonly known as: Senokot-S Take 2 tablets by mouth 2 (two) times daily.   tamsulosin 0.4 MG Caps capsule Commonly known as: FLOMAX Take 1 capsule (0.4 mg total) by mouth daily.   Vitamin D (Ergocalciferol) 1.25 MG (50000 UNIT) Caps capsule Commonly known as: DRISDOL Take 50,000 Units by mouth every 7 (seven) days.               Discharge Care Instructions  (From admission, onward)           Start     Ordered   06/03/22 0000  Change dressing on IV access line weekly and PRN  (Home infusion instructions - [facility] )  06/03/22 1414            Procedures/Studies: [place] EKG SITE RITE  Result Date: 05/31/2022 If Site Rite image not attached, placement could not be confirmed due to current cardiac rhythm.  ECHOCARDIOGRAM LIMITED  Result Date: 05/29/2022    ECHOCARDIOGRAM LIMITED REPORT   Patient Name:   AVRY ROEDL Date of Exam: 05/29/2022 Medical Rec #:  [id number]        Height:       72.0 in Accession #:    [id number]       Weight:       224.4 lb Date of Birth:  1951-05-01        BSA:           2.238 m Patient Age:    23 years         BP:           100/65 mmHg Patient Gender: M                HR:           96 bpm. Exam Location:  [facility] Procedure: Limited Echo, Intracardiac Opacification Agent, Cardiac Doppler and            Color Doppler Indications:    Bacteremia  History:        Patient has prior history of Echocardiogram examinations, most                 recent 04/21/2022. Previous Myocardial Infarction,                 Signs/Symptoms:Edema, Syncope, Altered Mental Status and Fever;                 Risk Factors:Sleep Apnea, Diabetes and Hypertension. Shock.  Sonographer:    Roseanna Rainbow RDCS Referring Phys: [id number] [place]  Sonographer Comments: Technically difficult study due to poor echo windows. Difficult images. Tube feeding dressing in subcostal region. Very low parastenal window. Definity attempted from two different IV sites. Suboptimal Definity visualization. IMPRESSIONS  1. Limited study. Definity images are not adequate.  2. Left ventricular ejection fraction, by estimation, is 45 to 50%. The left ventricle has mildly decreased function. The left ventricle demonstrates regional wall motion abnormalities (see scoring diagram/findings for description). There is mild left ventricular hypertrophy.  3. The mitral valve is degenerative. Trivial mitral valve regurgitation. Moderate mitral annular calcification.  4. The aortic valve was not well visualized. Aortic valve regurgitation is not visualized. Aortic valve sclerosis/calcification is present, without any evidence of aortic stenosis.  5. Tricuspid regurgitation signal is inadequate for assessing PA pressure.  6. The inferior vena cava is normal in size with greater than 50% respiratory variability, suggesting right atrial pressure of 3 mmHg. Comparison(s): Prior images reviewed side by side. LVEF and wall motion abnormalities similar to study from May 16. FINDINGS  Left Ventricle: Left ventricular ejection fraction, by estimation,  is 45 to 50%. The left ventricle has mildly decreased function. The left ventricle demonstrates regional wall motion abnormalities. Definity contrast agent was given IV to delineate the left ventricular endocardial borders. The left ventricular internal cavity size was normal in size. There is mild left ventricular hypertrophy.  LV Wall Scoring: The mid and distal anterior septum, entire apex, and mid inferoseptal segment are hypokinetic. The anterior wall, antero-lateral wall, inferior wall, posterior wall, basal anteroseptal segment, and basal inferoseptal segment are normal. Right Ventricle: Tricuspid regurgitation signal is inadequate for assessing PA pressure.  Pericardium: Trivial pericardial effusion is present. The pericardial effusion is anterior to the right ventricle. Mitral Valve: The mitral valve is degenerative in appearance. Moderate mitral annular calcification. Trivial mitral valve regurgitation. Tricuspid Valve: The tricuspid valve is grossly normal. Tricuspid valve regurgitation is trivial. Aortic Valve: The aortic valve was not well visualized. Aortic valve regurgitation is not visualized. Aortic valve sclerosis/calcification is present, without any evidence of aortic stenosis. Aorta: The aortic root is normal in size and structure. Venous: The inferior vena cava is normal in size with greater than 50% respiratory variability, suggesting right atrial pressure of 3 mmHg. LEFT VENTRICLE PLAX 2D LVIDd:         5.50 cm LVIDs:         4.10 cm LV PW:         1.20 cm LV IVS:        1.10 cm LVOT diam:     2.30 cm LVOT Area:     4.15 cm  LEFT ATRIUM         Index LA diam:    3.70 cm 1.65 cm/m   AORTA Ao Root diam: 3.20 cm  SHUNTS Systemic Diam: 2.30 cm Rozann Lesches MD Electronically signed by Rozann Lesches MD Signature Date/Time: 05/29/2022/3:39:38 PM    Final    CT HEAD WO CONTRAST (5MM)  Result Date: 05/28/2022 CLINICAL DATA:  Altered mental status EXAM: CT HEAD WITHOUT CONTRAST TECHNIQUE:  Contiguous axial images were obtained from the base of the skull through the vertex without intravenous contrast. RADIATION DOSE REDUCTION: This exam was performed according to the departmental dose-optimization program which includes automated exposure control, adjustment of the mA and/or kV according to patient size and/or use of iterative reconstruction technique. COMPARISON:  Prior head CT 04/19/2022 FINDINGS: Brain: No evidence of acute infarction, hemorrhage, hydrocephalus, extra-axial collection or mass lesion/mass effect. Stable mild atrophy. Vascular: No hyperdense vessel or unexpected calcification. Skull: Normal. Negative for fracture or focal lesion. Sinuses/Orbits: No acute finding. Other: Extensive punctate dermal calcifications. IMPRESSION: 1. No acute intracranial abnormality. Electronically Signed   By: Jacqulynn Cadet M.D.   On: 05/28/2022 08:42   CT Angio Chest PE W and/or Wo Contrast  Result Date: 05/27/2022 CLINICAL DATA:  Persistent tachycardia. EXAM: CT ANGIOGRAPHY CHEST WITH CONTRAST TECHNIQUE: Multidetector CT imaging of the chest was performed using the standard protocol during bolus administration of intravenous contrast. Multiplanar CT image reconstructions and MIPs were obtained to evaluate the vascular anatomy. RADIATION DOSE REDUCTION: This exam was performed according to the departmental dose-optimization program which includes automated exposure control, adjustment of the mA and/or kV according to patient size and/or use of iterative reconstruction technique. CONTRAST:  62m OMNIPAQUE IOHEXOL 350 MG/ML SOLN COMPARISON:  Apr 19, 2022 FINDINGS: Cardiovascular: Enlarged heart. Heavy calcific atherosclerotic disease of the coronary arteries. Mediastinum/Nodes: No enlarged mediastinal, hilar, or axillary lymph nodes. Thyroid gland, trachea, and esophagus demonstrate no significant findings. Lungs/Pleura: Scarring versus atelectasis in bilateral lung bases. Biapical pleural  thickening, left greater than right. Areas of ground-glass opacities in the left lung apex with associated mild architectural distortion. Upper Abdomen: No acute abnormality. Musculoskeletal: No chest wall abnormality. No acute or significant osseous findings. Review of the MIP images confirms the above findings. IMPRESSION: 1. No evidence of pulmonary embolism. 2. Enlarged heart with heavy calcific atherosclerotic disease of the coronary arteries. 3. Scarring versus atelectasis in bilateral lung bases. 4. Areas of ground-glass opacities in the left lung apex with associated mild architectural distortion. This finding has stable since  2019, which supports scarring. 5. Biapical pleural thickening, left greater than right. Electronically Signed   By: Fidela Salisbury M.D.   On: 05/27/2022 17:28   CT ABDOMEN PELVIS W CONTRAST  Result Date: 05/27/2022 CLINICAL DATA:  Abdominal pain.  Sacral ulcer.  Fever. EXAM: CT ABDOMEN AND PELVIS WITH CONTRAST TECHNIQUE: Multidetector CT imaging of the abdomen and pelvis was performed using the standard protocol following bolus administration of intravenous contrast. RADIATION DOSE REDUCTION: This exam was performed according to the departmental dose-optimization program which includes automated exposure control, adjustment of the mA and/or kV according to patient size and/or use of iterative reconstruction technique. CONTRAST:  84m OMNIPAQUE IOHEXOL 350 MG/ML SOLN COMPARISON:  CT abdomen and pelvis 04/13/2022 FINDINGS: Lower chest: There is some atelectasis in the lung bases. Hepatobiliary: No focal liver abnormality is seen. No gallstones, gallbladder wall thickening, or biliary dilatation. Pancreas: Unremarkable. No pancreatic ductal dilatation or surrounding inflammatory changes. Spleen: Normal in size without focal abnormality. Adrenals/Urinary Tract: Again seen is an extrarenal pelvis on the left. There is no hydronephrosis or perinephric fluid. The adrenal glands are  within normal limits. The bladder is decompressed by Foley catheter. Stomach/Bowel: Rectal wall thickening with surrounding inflammation and presacral edema appears unchanged. There is no evidence for bowel obstruction or free air. There is a large amount of stool throughout the colon. Appendix is within normal limits. Small bowel is within normal limits. Percutaneous gastrostomy tube in place, new from prior. Stomach is nondilated. Vascular/Lymphatic: There are severe atherosclerotic calcifications of the aorta. Peripheral vascular calcifications are also seen. No evidence for aortic aneurysm. No enlarged lymph nodes are identified. Reproductive: Prostate is unremarkable. Other: No ascites or focal abdominal wall hernia. Musculoskeletal: Sacral decubitus ulcer is again seen overlying the distal sacrum and coccyx. Previously identified fluid collection is no longer seen. The ulcer abuts the tip of the coccyx and distal sacrum with some questionable mild underlying erosive change of the coccyx. IMPRESSION: 1. Sacral decubitus ulcer again seen extending to the level of the distal sacrum and coccyx. There are erosive changes of the coccyx compatible with osteomyelitis. 2. Rectal wall thickening with surrounding inflammation persists concerning for proctitis. 3. Foley catheter in place. 4. New gastrostomy tube present. 5. Severe atherosclerotic disease. Aortic Atherosclerosis (ICD10-I70.0). Electronically Signed   By: ARonney AstersM.D.   On: 05/27/2022 17:25   DG Chest Port 1 View  Result Date: 05/27/2022 CLINICAL DATA:  Provided history: Fever, sepsis protocol. EXAM: PORTABLE CHEST 1 VIEW COMPARISON:  Prior chest radiographs 05/19/2022 and earlier. FINDINGS: Shallow inspiration radiograph. Heart size within normal limits. Aortic atherosclerosis. No appreciable airspace consolidation or pulmonary edema. No evidence of pleural effusion or pneumothorax. No acute bony abnormality identified. Degenerative changes of  the spine and left glenohumeral joint. IMPRESSION: Shallow inspiration radiograph. No evidence of acute cardiopulmonary abnormality. Aortic Atherosclerosis (ICD10-I70.0). Electronically Signed   By: KKellie SimmeringD.O.   On: 05/27/2022 12:17   DG Chest Port 1 View  Result Date: 05/19/2022 CLINICAL DATA:  Shortness of breath EXAM: PORTABLE CHEST 1 VIEW COMPARISON:  04/18/2022 FINDINGS: Low lung volumes. No consolidation or edema. No pleural effusion or pneumothorax. Cardiomediastinal contours are within normal limits. IMPRESSION: No acute process in the chest. Electronically Signed   By: PMacy MisM.D.   On: 05/19/2022 08:36   IR GASTROSTOMY TUBE MOD SED  Result Date: 05/17/2022 INDICATION: Dysphagia [place]: [place]  05/17/2022 05/17/2022 2:57 pm Radiologist:  M. TDaryll Brod MD Guidance:  Ultrasound  and fluoroscopic MEDICATIONS: 2 G ANCEF; Antibiotics were administered within 1 hour of the procedure. Glucagon 0.5 mg IV ANESTHESIA/SEDATION: Versed 1.5 mg IV; Fentanyl 50 mcg IV Moderate Sedation Time:  16 minutes The patient was continuously monitored during the procedure by the interventional radiology nurse under my direct supervision. CONTRAST:  13m OMNIPAQUE IOHEXOL 300 MG/ML SOLN - administered into the gastric lumen. FLUOROSCOPY: Fluoroscopy Time: 4 minutes 0 seconds (50 mGy). COMPLICATIONS: None immediate. PROCEDURE: Informed consent was obtained from the patient following explanation of the procedure, risks, benefits and alternatives. The patient understands, agrees and consents for the procedure. All questions were addressed. A time out was performed. Maximal barrier sterile technique utilized including caps, mask, sterile gowns, sterile gloves, large sterile drape, hand hygiene, and betadine prep. The left upper quadrant was sterilely prepped and draped. An oral gastric catheter was inserted into the stomach under fluoroscopy. The existing nasogastric feeding tube  was removed. Air was injected into the stomach for insufflation and visualization under fluoroscopy. The air distended stomach was confirmed beneath the anterior abdominal wall in the frontal and lateral projections. Under sterile conditions and local anesthesia, a 131gauge trocar needle was utilized to access the stomach percutaneously beneath the left subcostal margin. Needle position was confirmed within the stomach under biplane fluoroscopy. Contrast injection confirmed position also. A single T tack was deployed for gastropexy. Over an Amplatz guide wire, a 9-French sheath was inserted into the stomach. A snare device was utilized to capture the oral gastric catheter. The snare device was pulled retrograde from the stomach up the esophagus and out the oropharynx. The 20-French pull-through gastrostomy was connected to the snare device and pulled antegrade through the oropharynx down the esophagus into the stomach and then through the percutaneous tract external to the patient. The gastrostomy was assembled externally. Contrast injection confirms position in the stomach. Images were obtained for documentation. The patient tolerated procedure well. No immediate complication. IMPRESSION: Fluoroscopic insertion of a 20-French "pull-through" gastrostomy. Electronically Signed   By: MJerilynn Mages  Shick M.D.   On: 05/17/2022 15:49     Subjective: No specific complaints, has been refusing wound care at times.   Discharge Exam: Vitals:   06/03/22 0418 06/03/22 1427  BP: (!) 142/94 (!) 144/88  Pulse: (!) 105 98  Resp: 19 18  Temp: 98.2 F (36.8 C) 97.9 F (36.6 C)  SpO2: 100% 99%   Vitals:   06/02/22 1353 06/02/22 2117 06/03/22 0418 06/03/22 1427  BP: 135/90 131/83 (!) 142/94 (!) 144/88  Pulse: 90 91 (!) 105 98  Resp: '19 19 19 18  ' Temp: 97.8 F (36.6 C) 98 F (36.7 C) 98.2 F (36.8 C) 97.9 F (36.6 C)  TempSrc: Oral Oral Oral Oral  SpO2: 100% 100% 100% 99%  Weight:      Height:       General exam:  awake, alert, NAD.  Respiratory system: Clear to auscultation. Respiratory effort normal. Cardiovascular system: normal S1 & S2 heard, no m/r/g  Gastrointestinal system: Abdomen is soft, feeding tube C/D/I Central nervous system: no focal findings on exam Extremities: No edema Skin: noted large sacral decibitus Psychiatry: Flat affect   The results of significant diagnostics from this hospitalization (including imaging, microbiology, ancillary and laboratory) are listed below for reference.     Microbiology: Recent Results (from the past 240 hour(s))  Culture, blood (Routine x 2)     Status: None   Collection Time: 05/27/22 11:52 AM   Specimen: BLOOD  Result Value Ref Range  Status   Specimen Description BLOOD BLOOD RIGHT FOREARM  Final   Special Requests   Final    BOTTLES DRAWN AEROBIC AND ANAEROBIC Blood Culture adequate volume   Culture   Final    NO GROWTH 5 DAYS Performed at [facility], [address], [place], [place] [address]    Report Status 06/01/2022 FINAL  Final  Culture, blood (Routine x 2)     Status: Abnormal   Collection Time: 05/27/22  1:33 PM   Specimen: BLOOD  Result Value Ref Range Status   Specimen Description   Final    BLOOD LEFT ANTECUBITAL Performed at [facility], [address] [place], [place] [address]    Special Requests   Final    BOTTLES DRAWN AEROBIC AND ANAEROBIC Blood Culture adequate volume Performed at [facility], [address] [place], [place] [address]    Culture  Setup Time   Final    GRAM POSITIVE COCCI IN BOTH AEROBIC AND ANAEROBIC BOTTLES Gram Stain Report Called to,Read Back By and Verified With: SULY VILLALOBOF AT 9983 BY SA ON 05/28/22 CRITICAL RESULT CALLED TO, READ BACK BY AND VERIFIED WITH: RN Herold Harms [phone] '@[phone]'  FH Performed at [facility], [place] [address], [place], [place] [address]    Culture STREPTOCOCCUS CONSTELLATUS (A)  Final   Report Status 05/31/2022 FINAL  Final   Organism ID, Bacteria STREPTOCOCCUS  CONSTELLATUS  Final      Susceptibility   Streptococcus constellatus - MIC*    PENICILLIN 0.12 SENSITIVE Sensitive     CEFTRIAXONE 0.5 SENSITIVE Sensitive     ERYTHROMYCIN <=0.12 SENSITIVE Sensitive     LEVOFLOXACIN 0.5 SENSITIVE Sensitive     VANCOMYCIN 0.5 SENSITIVE Sensitive     * STREPTOCOCCUS CONSTELLATUS  Blood Culture ID Panel (Reflexed)     Status: Abnormal   Collection Time: 05/27/22  1:33 PM  Result Value Ref Range Status   Enterococcus faecalis NOT DETECTED NOT DETECTED Final   Enterococcus Faecium NOT DETECTED NOT DETECTED Final   Listeria monocytogenes NOT DETECTED NOT DETECTED Final   Staphylococcus species NOT DETECTED NOT DETECTED Final   Staphylococcus aureus (BCID) NOT DETECTED NOT DETECTED Final   Staphylococcus epidermidis NOT DETECTED NOT DETECTED Final   Staphylococcus lugdunensis NOT DETECTED NOT DETECTED Final   Streptococcus species DETECTED (A) NOT DETECTED Final    Comment: Not Enterococcus species, Streptococcus agalactiae, Streptococcus pyogenes, or Streptococcus pneumoniae. CRITICAL RESULT CALLED TO, READ BACK BY AND VERIFIED WITH: RN N. HEARP [phone] '@[phone]'  FH    Streptococcus agalactiae NOT DETECTED NOT DETECTED Final   Streptococcus pneumoniae NOT DETECTED NOT DETECTED Final   Streptococcus pyogenes NOT DETECTED NOT DETECTED Final   A.calcoaceticus-baumannii NOT DETECTED NOT DETECTED Final   Bacteroides fragilis NOT DETECTED NOT DETECTED Final   Enterobacterales NOT DETECTED NOT DETECTED Final   Enterobacter cloacae complex NOT DETECTED NOT DETECTED Final   Escherichia coli NOT DETECTED NOT DETECTED Final   Klebsiella aerogenes NOT DETECTED NOT DETECTED Final   Klebsiella oxytoca NOT DETECTED NOT DETECTED Final   Klebsiella pneumoniae NOT DETECTED NOT DETECTED Final   Proteus species NOT DETECTED NOT DETECTED Final   Salmonella species NOT DETECTED NOT DETECTED Final   Serratia marcescens NOT DETECTED NOT DETECTED Final   Haemophilus influenzae NOT  DETECTED NOT DETECTED Final   Neisseria meningitidis NOT DETECTED NOT DETECTED Final   Pseudomonas aeruginosa NOT DETECTED NOT DETECTED Final   Stenotrophomonas maltophilia NOT DETECTED NOT DETECTED Final   Candida albicans NOT DETECTED  NOT DETECTED Final   Candida auris NOT DETECTED NOT DETECTED Final   Candida glabrata NOT DETECTED NOT DETECTED Final   Candida krusei NOT DETECTED NOT DETECTED Final   Candida parapsilosis NOT DETECTED NOT DETECTED Final   Candida tropicalis NOT DETECTED NOT DETECTED Final   Cryptococcus neoformans/gattii NOT DETECTED NOT DETECTED Final    Comment: Performed at [facility], [place] [address], [place], [place] [address]  Remove and replace urinary cath (placed > 5 days) then obtain urine culture from new indwelling urinary catheter.     Status: None   Collection Time: 05/28/22  1:40 AM   Specimen: Urine, Catheterized  Result Value Ref Range Status   Specimen Description   Final    URINE, CATHETERIZED Performed at [facility], [address], [place], [place] [address]    Special Requests   Final    NONE Performed at [facility], [address], [place], [place] [address]    Culture   Final    NO GROWTH Performed at [facility], [place] [address], [place], [place] [address]    Report Status 05/29/2022 FINAL  Final  Culture, blood (Routine X 2) w Reflex to ID Panel     Status: None   Collection Time: 05/29/22  5:50 AM   Specimen: Right Antecubital; Blood  Result Value Ref Range Status   Specimen Description RIGHT ANTECUBITAL BOTTLES DRAWN AEROBIC ONLY  Final   Special Requests Blood Culture adequate volume  Final   Culture   Final    NO GROWTH 5 DAYS Performed at [facility], [address] [place], [place] [address]    Report Status 06/03/2022 FINAL  Final  Culture, blood (Routine X 2) w Reflex to ID Panel     Status: None   Collection Time: 05/29/22  6:04 AM   Specimen: BLOOD RIGHT HAND  Result Value Ref Range Status   Specimen  Description BLOOD RIGHT HAND BOTTLES DRAWN AEROBIC ONLY  Final   Special Requests   Final    Blood Culture results may not be optimal due to an excessive volume of blood received in culture bottles   Culture   Final    NO GROWTH 5 DAYS Performed at [facility], [address] [place], [place] [address]    Report Status 06/03/2022 FINAL  Final  Aerobic Culture w Gram Stain (superficial specimen)     Status: None   Collection Time: 05/29/22  1:13 PM   Specimen: Sacral  Result Value Ref Range Status   Specimen Description   Final    SACRAL Performed at [facility], [address] [place], [place] [address]    Special Requests   Final    BONE BIOPSY Performed at [facility], [address], [place], [place] [address]    Gram Stain   Final    NO SQUAMOUS EPITHELIAL CELLS SEEN FEW WBC SEEN MODERATE GRAM NEGATIVE RODS MODERATE GRAM POSITIVE COCCI FEW GRAM POSITIVE RODS Performed at [facility], [place] [address], [place], [place] [address]    Culture   Final    FEW VANCOMYCIN RESISTANT ENTEROCOCCUS RARE PROTEUS MIRABILIS    Report Status 06/02/2022 FINAL  Final   Organism ID, Bacteria PROTEUS MIRABILIS  Final   Organism ID, Bacteria VANCOMYCIN RESISTANT ENTEROCOCCUS  Final      Susceptibility   Proteus mirabilis - MIC*    AMPICILLIN <=2 SENSITIVE Sensitive     CEFAZOLIN <=4 SENSITIVE Sensitive  CEFEPIME <=0.12 SENSITIVE Sensitive     CEFTAZIDIME <=1 SENSITIVE Sensitive     CEFTRIAXONE <=0.25 SENSITIVE Sensitive     CIPROFLOXACIN <=0.25 SENSITIVE Sensitive     GENTAMICIN <=1 SENSITIVE Sensitive     IMIPENEM 1 SENSITIVE Sensitive     TRIMETH/SULFA <=20 SENSITIVE Sensitive     AMPICILLIN/SULBACTAM <=2 SENSITIVE Sensitive     PIP/TAZO <=4 SENSITIVE Sensitive     * RARE PROTEUS MIRABILIS   Vancomycin resistant enterococcus - MIC*    AMPICILLIN <=2 SENSITIVE Sensitive     VANCOMYCIN >=32 RESISTANT Resistant     GENTAMICIN SYNERGY SENSITIVE Sensitive     LINEZOLID 2  SENSITIVE Sensitive     * FEW VANCOMYCIN RESISTANT ENTEROCOCCUS     Labs: BNP (last 3 results) Recent Labs    04/20/22 0245 04/21/22 0114 04/22/22 0359  BNP 635.0* 361.2* 025.4*   Basic Metabolic Panel: Recent Labs  Lab 05/29/22 0551 05/30/22 0551 05/31/22 0609 06/01/22 0546 06/02/22 0529  NA 138 141 134* 135 132*  K 4.1 4.5 4.2 4.1 4.3  CL 113* 111 109 108 101  CO2 20* 25 21* 24 26  GLUCOSE 249* 206* 157* 299* 306*  BUN 48* 41* 34* 32* 30*  CREATININE 0.94 0.86 0.73 0.77 0.65  CALCIUM 9.0 9.6 9.1 9.4 9.1  MG 2.2 1.9 1.7 1.6* 1.7   Liver Function Tests: Recent Labs  Lab 05/28/22 0556 05/29/22 0551 05/30/22 0551 05/31/22 0609  AST 109* 54* 25 19  ALT 100* 74* 53* 38  ALKPHOS 71 63 61 52  BILITOT 0.6 0.5 0.5 0.5  PROT 7.5 6.8 7.1 6.5  ALBUMIN 2.2* 2.0* 2.1* 2.0*   No results for input(s): "LIPASE", "AMYLASE" in the last 168 hours. No results for input(s): "AMMONIA" in the last 168 hours. CBC: Recent Labs  Lab 05/29/22 0551 05/30/22 0551 05/31/22 0609 06/01/22 0546 06/02/22 0529  WBC 5.3 6.8 7.8 7.0 6.7  HGB 11.0* 11.0* 10.1* 9.9* 9.7*  HCT 35.9* 36.3* 32.8* 31.6* 31.5*  MCV 90.0 90.1 89.1 87.3 87.3  PLT 339 300 275 290 314   Cardiac Enzymes: No results for input(s): "CKTOTAL", "CKMB", "CKMBINDEX", "TROPONINI" in the last 168 hours. BNP: Invalid input(s): "POCBNP" CBG: Recent Labs  Lab 06/02/22 1955 06/03/22 0009 06/03/22 0420 06/03/22 0700 06/03/22 1108  GLUCAP 292* 246* 278* 251* 286*   D-Dimer No results for input(s): "DDIMER" in the last 72 hours. Hgb A1c No results for input(s): "HGBA1C" in the last 72 hours. Lipid Profile No results for input(s): "CHOL", "HDL", "LDLCALC", "TRIG", "CHOLHDL", "LDLDIRECT" in the last 72 hours. Thyroid function studies No results for input(s): "TSH", "T4TOTAL", "T3FREE", "THYROIDAB" in the last 72 hours.  Invalid input(s): "FREET3" Anemia work up No results for input(s): "VITAMINB12", "FOLATE",  "FERRITIN", "TIBC", "IRON", "RETICCTPCT" in the last 72 hours. Urinalysis    Component Value Date/Time   COLORURINE YELLOW 05/27/2022 1147   APPEARANCEUR CLOUDY (A) 05/27/2022 1147   APPEARANCEUR Cloudy 08/01/2014 1614   LABSPEC 1.016 05/27/2022 1147   LABSPEC 1.008 08/01/2014 1614   PHURINE 8.0 05/27/2022 1147   GLUCOSEU NEGATIVE 05/27/2022 1147   GLUCOSEU Negative 08/01/2014 1614   HGBUR NEGATIVE 05/27/2022 1147   BILIRUBINUR NEGATIVE 05/27/2022 1147   BILIRUBINUR Negative 08/01/2014 [place] 05/27/2022 1147   PROTEINUR 30 (A) 05/27/2022 1147   UROBILINOGEN 0.2 07/01/2014 1809   NITRITE NEGATIVE 05/27/2022 1147   LEUKOCYTESUR LARGE (A) 05/27/2022 1147   LEUKOCYTESUR Negative 08/01/2014 1614   Sepsis Labs Recent Labs  Lab 05/30/22 0551 05/31/22 0609 06/01/22 0546 06/02/22 0529  WBC 6.8 7.8 7.0 6.7   Microbiology Recent Results (from the past 240 hour(s))  Culture, blood (Routine x 2)     Status: None   Collection Time: 05/27/22 11:52 AM   Specimen: BLOOD  Result Value Ref Range Status   Specimen Description BLOOD BLOOD RIGHT FOREARM  Final   Special Requests   Final    BOTTLES DRAWN AEROBIC AND ANAEROBIC Blood Culture adequate volume   Culture   Final    NO GROWTH 5 DAYS Performed at [facility], [address], [place], [place] [address]    Report Status 06/01/2022 FINAL  Final  Culture, blood (Routine x 2)     Status: Abnormal   Collection Time: 05/27/22  1:33 PM   Specimen: BLOOD  Result Value Ref Range Status   Specimen Description   Final    BLOOD LEFT ANTECUBITAL Performed at [facility], [address], [place], [place] [address]    Special Requests   Final    BOTTLES DRAWN AEROBIC AND ANAEROBIC Blood Culture adequate volume Performed at [facility], [address] [place], [place] [address]    Culture  Setup Time   Final    GRAM POSITIVE COCCI IN BOTH AEROBIC AND ANAEROBIC BOTTLES Gram Stain Report Called to,Read Back By and  Verified With: SULY VILLALOBOF AT 6283 BY SA ON 05/28/22 CRITICAL RESULT CALLED TO, READ BACK BY AND VERIFIED WITH: RN Herold Harms [phone] '@[phone]'  FH Performed at [facility], [place] [address], [place],  [address]    Culture STREPTOCOCCUS CONSTELLATUS (A)  Final   Report Status 05/31/2022 FINAL  Final   Organism ID, Bacteria STREPTOCOCCUS CONSTELLATUS  Final      Susceptibility   Streptococcus constellatus - MIC*    PENICILLIN 0.12 SENSITIVE Sensitive     CEFTRIAXONE 0.5 SENSITIVE Sensitive     ERYTHROMYCIN <=0.12 SENSITIVE Sensitive     LEVOFLOXACIN 0.5 SENSITIVE Sensitive     VANCOMYCIN 0.5 SENSITIVE Sensitive     * STREPTOCOCCUS CONSTELLATUS  Blood Culture ID Panel (Reflexed)     Status: Abnormal   Collection Time: 05/27/22  1:33 PM  Result Value Ref Range Status   Enterococcus faecalis NOT DETECTED NOT DETECTED Final   Enterococcus Faecium NOT DETECTED NOT DETECTED Final   Listeria monocytogenes NOT DETECTED NOT DETECTED Final   Staphylococcus species NOT DETECTED NOT DETECTED Final   Staphylococcus aureus (BCID) NOT DETECTED NOT DETECTED Final   Staphylococcus epidermidis NOT DETECTED NOT DETECTED Final   Staphylococcus lugdunensis NOT DETECTED NOT DETECTED Final   Streptococcus species DETECTED (A) NOT DETECTED Final    Comment: Not Enterococcus species, Streptococcus agalactiae, Streptococcus pyogenes, or Streptococcus pneumoniae. CRITICAL RESULT CALLED TO, READ BACK BY AND VERIFIED WITH: RN NLucina Mellow [phone] '@[phone]'  FH    Streptococcus agalactiae NOT DETECTED NOT DETECTED Final   Streptococcus pneumoniae NOT DETECTED NOT DETECTED Final   Streptococcus pyogenes NOT DETECTED NOT DETECTED Final   A.calcoaceticus-baumannii NOT DETECTED NOT DETECTED Final   Bacteroides fragilis NOT DETECTED NOT DETECTED Final   Enterobacterales NOT DETECTED NOT DETECTED Final   Enterobacter cloacae complex NOT DETECTED NOT DETECTED Final   Escherichia coli NOT DETECTED NOT DETECTED Final    Klebsiella aerogenes NOT DETECTED NOT DETECTED Final   Klebsiella oxytoca NOT DETECTED NOT DETECTED Final   Klebsiella pneumoniae NOT DETECTED NOT DETECTED Final   Proteus species NOT DETECTED NOT DETECTED Final   Salmonella species NOT DETECTED NOT  DETECTED Final   Serratia marcescens NOT DETECTED NOT DETECTED Final   Haemophilus influenzae NOT DETECTED NOT DETECTED Final   Neisseria meningitidis NOT DETECTED NOT DETECTED Final   Pseudomonas aeruginosa NOT DETECTED NOT DETECTED Final   Stenotrophomonas maltophilia NOT DETECTED NOT DETECTED Final   Candida albicans NOT DETECTED NOT DETECTED Final   Candida auris NOT DETECTED NOT DETECTED Final   Candida glabrata NOT DETECTED NOT DETECTED Final   Candida krusei NOT DETECTED NOT DETECTED Final   Candida parapsilosis NOT DETECTED NOT DETECTED Final   Candida tropicalis NOT DETECTED NOT DETECTED Final   Cryptococcus neoformans/gattii NOT DETECTED NOT DETECTED Final    Comment: Performed at [facility], [place] [address] [place], [place] [address]  Remove and replace urinary cath (placed > 5 days) then obtain urine culture from new indwelling urinary catheter.     Status: None   Collection Time: 05/28/22  1:40 AM   Specimen: Urine, Catheterized  Result Value Ref Range Status   Specimen Description   Final    URINE, CATHETERIZED Performed at [facility], [address] [place], [place] [address]    Special Requests   Final    NONE Performed at [facility], [address], [place], [place] [address]    Culture   Final    NO GROWTH Performed at [facility], [place] [address] [place], [place] [address]    Report Status 05/29/2022 FINAL  Final  Culture, blood (Routine X 2) w Reflex to ID Panel     Status: None   Collection Time: 05/29/22  5:50 AM   Specimen: Right Antecubital; Blood  Result Value Ref Range Status   Specimen Description RIGHT ANTECUBITAL BOTTLES DRAWN AEROBIC ONLY  Final   Special Requests Blood Culture  adequate volume  Final   Culture   Final    NO GROWTH 5 DAYS Performed at [facility], [address], [place], [place] [address]    Report Status 06/03/2022 FINAL  Final  Culture, blood (Routine X 2) w Reflex to ID Panel     Status: None   Collection Time: 05/29/22  6:04 AM   Specimen: BLOOD RIGHT HAND  Result Value Ref Range Status   Specimen Description BLOOD RIGHT HAND BOTTLES DRAWN AEROBIC ONLY  Final   Special Requests   Final    Blood Culture results may not be optimal due to an excessive volume of blood received in culture bottles   Culture   Final    NO GROWTH 5 DAYS Performed at [facility], [address], [place], [place] [address]    Report Status 06/03/2022 FINAL  Final  Aerobic Culture w Gram Stain (superficial specimen)     Status: None   Collection Time: 05/29/22  1:13 PM   Specimen: Sacral  Result Value Ref Range Status   Specimen Description   Final    SACRAL Performed at [facility], [address], [place], [place] [address]    Special Requests   Final    BONE BIOPSY Performed at [facility], [address] [place], [place] [address]    Gram Stain   Final    NO SQUAMOUS EPITHELIAL CELLS SEEN FEW WBC SEEN MODERATE GRAM NEGATIVE RODS MODERATE GRAM POSITIVE COCCI FEW GRAM POSITIVE RODS Performed at [facility], [place] [address], [place],  [address]    Culture   Final    FEW VANCOMYCIN RESISTANT ENTEROCOCCUS RARE PROTEUS MIRABILIS    Report Status 06/02/2022  FINAL  Final   Organism ID, Bacteria PROTEUS MIRABILIS  Final   Organism ID, Bacteria VANCOMYCIN RESISTANT ENTEROCOCCUS  Final      Susceptibility   Proteus mirabilis - MIC*    AMPICILLIN <=2 SENSITIVE Sensitive     CEFAZOLIN <=4 SENSITIVE Sensitive     CEFEPIME <=0.12 SENSITIVE Sensitive     CEFTAZIDIME <=1 SENSITIVE Sensitive     CEFTRIAXONE <=0.25 SENSITIVE Sensitive     CIPROFLOXACIN <=0.25 SENSITIVE Sensitive     GENTAMICIN <=1 SENSITIVE Sensitive     IMIPENEM 1 SENSITIVE  Sensitive     TRIMETH/SULFA <=20 SENSITIVE Sensitive     AMPICILLIN/SULBACTAM <=2 SENSITIVE Sensitive     PIP/TAZO <=4 SENSITIVE Sensitive     * RARE PROTEUS MIRABILIS   Vancomycin resistant enterococcus - MIC*    AMPICILLIN <=2 SENSITIVE Sensitive     VANCOMYCIN >=32 RESISTANT Resistant     GENTAMICIN SYNERGY SENSITIVE Sensitive     LINEZOLID 2 SENSITIVE Sensitive     * FEW VANCOMYCIN RESISTANT ENTEROCOCCUS   Time coordinating discharge:  50 mins   SIGNED:  Irwin Brakeman, MD  [facility] 06/03/2022, 3:12 PM How to contact the [facility] Attending or Consulting provider [place] or covering provider during after hours [place], for this patient?  Check the care team in [facility] and look for a) attending/consulting TRH provider listed and b) the [facility] team listed Log into [email or web address] and use [place]'s universal password to access. If you do not have the password, please contact the hospital operator. Locate the [facility] provider you are looking for under [facility] and page to a number that you can be directly reached. If you still have difficulty reaching the provider, please page the [facility] (Director on Call) for the Hospitalists listed on amion for assistance.

## 2022-06-03 NOTE — Plan of Care (Signed)
Goals met 

## 2022-06-03 NOTE — Progress Notes (Signed)
PHARMACY CONSULT NOTE FOR:  OUTPATIENT  PARENTERAL ANTIBIOTIC THERAPY (OPAT)  Indication: Bacteremia Regimen: Unasyn 3gm IV q6hrs until 06/11/2022 then pt will take Augmentin 875mg  PO q12hrs thru 07/09/2022 End date: for Unasyn: 06/10/2022  IV antibiotic discharge orders are pended. To discharging provider:  please sign these orders via discharge navigator,  Select New Orders & click on the button choice - Manage This Unsigned Work.     Thank you for allowing pharmacy to be a part of this patient's care.  06/12/2022 A 06/03/2022, 2:02 PM

## 2022-06-03 NOTE — NC FL2 (Signed)
[place] MEDICAID FL2 LEVEL OF CARE SCREENING TOOL     IDENTIFICATION  Patient Name: Christian [name] Birthdate: 1951/07/12 Sex: male Admission Date (Current Location): 05/27/2022  [place] [facility] and [place] Number:  [employer] and Address:  [facility],  [address], [place] [address]      Provider Number: [id number]  Attending Physician Name and Address:  Cleora Fleet, MD  Relative Name and Phone Number:       Current Level of Care: Hospital Recommended Level of Care: Skilled Nursing Facility Prior Approval Number:    Date Approved/Denied:   PASRR Number:    Discharge Plan: SNF    Current Diagnoses: Patient Active Problem List   Diagnosis Date Noted   Osteomyelitis (HCC)    Severe sepsis (HCC) 05/27/2022   Sacral decubitus ulcer, stage IV (HCC) 05/27/2022   Hypoglycemia 03/12/2022   Cellulitis in diabetic foot (HCC) 01/26/2022   Diabetic ulcer of ankle (HCC) 01/26/2022   Severe sepsis without septic shock (HCC) 04/02/2021   Lactic acidosis 04/02/2021   Hyponatremia 04/02/2021   Hyperkalemia 04/02/2021   Chronic kidney disease, stage 3b (HCC) 04/02/2021   Fever 04/02/2021   Septic shock (HCC) 04/02/2021   Altered mental status    AKI (acute kidney injury) (HCC)    CVA (cerebral vascular accident) (HCC) 10/07/2020   Type 2 diabetes mellitus with stage 3 chronic kidney disease (HCC) 04/01/2020   CAD (coronary artery disease), native coronary artery 04/01/2020   NSTEMI (non-ST elevated myocardial infarction) (HCC) 03/31/2020   Obesity, Class III, BMI 40-49.9 (morbid obesity) (HCC) 03/31/2020   Aspiration pneumonia of both lower lobes due to gastric secretions (HCC) 03/21/2020   Sacral decubitus ulcer, stage II (HCC) 03/19/2020   Chronic diastolic CHF (congestive heart failure) (HCC) 03/17/2020   Atrial flutter, paroxysmal (HCC) 12/27/2019   Constipation 09/16/2018   Bipolar 1 disorder, depressed (HCC) 08/22/2018   Encephalopathy     Paroxysmal atrial fibrillation (HCC) 06/18/2018   History of pulmonary embolism 06/17/2018   Generalized weakness 03/27/2018   Diabetes mellitus type 2 in nonobese (HCC) 03/27/2018   Pulmonary embolism (HCC) 03/27/2018   Fall    Laceration of eyebrow    Bipolar I disorder, most recent episode depressed, severe w psychosis (HCC) 12/22/2015   Bipolar affective disorder, depressed, severe, with psychotic behavior (HCC)    Bipolar I disorder, current or most recent episode depressed, with psychotic features (HCC)    Severe bipolar I disorder with depression (HCC)    Bipolar I disorder, most recent episode depressed, severe with psychotic features (HCC)    Overdose 09/11/2015   Acute respiratory failure with hypoxia (HCC) 09/05/2015   Elevated troponin 09/05/2015   Somnolence 09/05/2015   Bipolar depression (HCC) 09/05/2015   Acute bilateral deep vein thrombosis (DVT) of femoral veins (HCC) 09/05/2015   Bilateral pulmonary embolism (HCC) 09/02/2015   Labile hypertension    Pulmonary embolism with acute cor pulmonale (HCC)    Dyspnea    Orthostatic hypotension    Bipolar disorder, in partial remission, most recent episode depressed (HCC)    Syncope, near 08/31/2015   Bipolar and related disorder (HCC)    Bipolar affective disorder, depressed, mild (HCC) 08/28/2015   Pressure ulcer 07/30/2015   Protein-calorie malnutrition, severe (HCC) 07/30/2015   Dehydration 07/29/2015   Diabetes (HCC) 07/09/2015   Urinary tract infection associated with catheterization of urinary tract (HCC) 06/19/2014   Bacteremia 06/19/2014   Bipolar affective disorder, current episode manic with psychotic symptoms (HCC)  06/17/2014   Type 2 diabetes mellitus with complication, without long-term current use of insulin (HCC) 06/02/2014   Other and unspecified hyperlipidemia 06/02/2014   OSA on CPAP 06/02/2014   Bilateral lower extremity edema: chronic with venous stasis changes 06/02/2014   Gout 06/02/2014    Hypertension     Orientation RESPIRATION BLADDER Height & Weight     Self, Time, Place  Normal Indwelling catheter Weight: 224 lb 6.9 oz (101.8 kg) Height:  6' (182.9 cm)  BEHAVIORAL SYMPTOMS/MOOD NEUROLOGICAL BOWEL NUTRITION STATUS      Incontinent Feeding tube, Diet (see dc summary for supplemental tube feeds, Dysphagia I for oral foods)  AMBULATORY STATUS COMMUNICATION OF NEEDS Skin   Extensive Assist Verbally PU Stage and Appropriate Care (R Heel)       PU Stage 4 Dressing: Daily (Sacrum, see dc summary)               Personal Care Assistance Level of Assistance  Feeding Bathing Assistance: Maximum assistance Feeding assistance: Maximum assistance (see Speech therapist note, needs to be fed slowly) Dressing Assistance: Limited assistance     Functional Limitations Info    Sight Info: Impaired Hearing Info: Adequate Speech Info: Impaired    SPECIAL CARE FACTORS FREQUENCY  PT (By licensed PT), OT (By licensed OT), Speech therapy     PT Frequency: 5x week OT Frequency: 3x week     Speech Therapy Frequency: weekly      Contractures Contractures Info: Not present    Additional Factors Info  Code Status, Allergies, Psychotropic, Insulin Sliding Scale Code Status Info: Full Allergies Info: Amoldipine, Onglyza, Zyprexa, Ritalin Psychotropic Info: Abilify, Cogentin Insulin Sliding Scale Info: Insulin Sliding Scare-see dc summary       Current Medications (06/03/2022):  This is the current hospital active medication list Current Facility-Administered Medications  Medication Dose Route Frequency Provider Last Rate Last Admin   acetaminophen (TYLENOL) tablet 650 mg  650 mg Per Tube Q6H PRN Sherryll Burger, Pratik D, DO   650 mg at 06/03/22 2426   Or   acetaminophen (TYLENOL) suppository 650 mg  650 mg Rectal Q6H PRN Maurilio Lovely D, DO       [START ON 06/11/2022] amoxicillin-clavulanate (AUGMENTIN) 875-125 MG per tablet 1 tablet  1 tablet Oral Q12H Kathlynn Grate, DO        Ampicillin-Sulbactam (UNASYN) 3 g in sodium chloride 0.9 % 100 mL IVPB  3 g Intravenous Q6H Kathlynn Grate, DO 200 mL/hr at 06/03/22 0757 3 g at 06/03/22 0757   ARIPiprazole (ABILIFY) tablet 10 mg  10 mg Per Tube q morning Sherryll Burger, Pratik D, DO   10 mg at 06/03/22 0912   benztropine (COGENTIN) tablet 1 mg  1 mg Per Tube BID Sherryll Burger, Pratik D, DO   1 mg at 06/03/22 8341   Chlorhexidine Gluconate Cloth 2 % PADS 6 each  6 each Topical Q0600 Emokpae, Ejiroghene E, MD   6 each at 06/03/22 0509   feeding supplement (PROSource TF) liquid 45 mL  45 mL Per Tube BID Sherryll Burger, Pratik D, DO   45 mL at 06/03/22 0913   feeding supplement (VITAL 1.5 CAL) liquid 1,000 mL  1,000 mL Per Tube Continuous Maurilio Lovely D, DO 65 mL/hr at 06/03/22 0556 Infusion Verify at 06/03/22 0556   fluconazole (DIFLUCAN) IVPB 100 mg  100 mg Intravenous Q24H Maurilio Lovely D, DO 50 mL/hr at 06/02/22 1717 100 mg at 06/02/22 1717   insulin aspart (novoLOG) injection 0-20 Units  0-20 Units  Subcutaneous Q4H Johnson, Clanford L, MD   11 Units at 06/03/22 1359   insulin glargine-yfgn (SEMGLEE) injection 40 Units  40 Units Subcutaneous Daily Johnson, Clanford L, MD   40 Units at 06/03/22 1050   liothyronine (CYTOMEL) tablet 10 mcg  10 mcg Per Tube Daily Sherryll Burger, Pratik D, DO   10 mcg at 06/03/22 1050   metoprolol succinate (TOPROL-XL) 24 hr tablet 50 mg  50 mg Oral Daily Sherryll Burger, Pratik D, DO   50 mg at 06/03/22 8588   nutrition supplement (JUVEN) (JUVEN) powder packet 1 packet  1 packet Per Tube BID BM Sherryll Burger, Pratik D, DO   1 packet at 06/03/22 0912   ondansetron (ZOFRAN) tablet 4 mg  4 mg Per Tube Q6H PRN Sherryll Burger, Pratik D, DO       Or   ondansetron (ZOFRAN) injection 4 mg  4 mg Intravenous Q6H PRN Sherryll Burger, Pratik D, DO       rivaroxaban (XARELTO) tablet 20 mg  20 mg Oral Q supper Sherryll Burger, Pratik D, DO   20 mg at 06/02/22 1713   senna-docusate (Senokot-S) tablet 2 tablet  2 tablet Oral BID Sherryll Burger, Pratik D, DO   2 tablet at 06/03/22 5027   sodium chloride flush (NS)  0.9 % injection 10-40 mL  10-40 mL Intracatheter Q12H Shah, Pratik D, DO   10 mL at 06/02/22 2145   sodium chloride flush (NS) 0.9 % injection 10-40 mL  10-40 mL Intracatheter PRN Sherryll Burger, Pratik D, DO       tamsulosin [facility]) capsule 0.4 mg  0.4 mg Oral Daily Sherryll Burger, Pratik D, DO   0.4 mg at 06/03/22 7412     Discharge Medications: Please see discharge summary for a list of discharge medications.  Relevant Imaging Results:  Relevant Lab Results:   Additional Information SSN: [id number], IV anbx see dc summary  Elliot Gault, LCSW

## 2022-06-03 NOTE — TOC Transition Note (Signed)
Transition of Care [facility]) - CM/SW Discharge Note   Patient Details  Name: Christian [name] MRN: [id number] Date of Birth: [birth year]  [facility] CM/SW Contact:  Elliot Gault, LCSW Phone Number: 06/03/2022, 1:52 PM   Clinical Narrative:     Pt stable for dc today per MD. Plan remains for return to [facility] for IV anbx and therapy. Insurance Berkley Harvey has been obtained. Updated Jill [facility] at [place] and they can accept pt today. Updated pt's legal guardian, [name], and she is agreeable to the dc plan.  DC clinical will be sent electronically once it is complete. RN to call report. EMS transport will be arranged.  No other TOC needs for dc.  Final next level of care: Skilled Nursing Facility Barriers to Discharge: Barriers Resolved   Patient Goals and CMS Choice Patient states their goals for this hospitalization and ongoing recovery are:: to return to SNF. CMS Medicare.gov Compare Post Acute Care list provided to:: Other (Comment Required) (BCY)    Discharge Placement                       Discharge Plan and Services     Post Acute Care Choice: Skilled Nursing Facility                               Social Determinants of Health (SDOH) Interventions     Readmission Risk Interventions    05/28/2022   12:57 PM 05/14/2022   12:39 PM 04/13/2022    3:06 PM  Readmission Risk Prevention Plan  Transportation Screening Complete Complete   PCP or Specialist Appt within 3-5 Days Complete  Complete  HRI or Home Care Consult Complete  Complete  Social Work Consult for Recovery Care Planning/Counseling   Complete  Palliative Care Screening Not Complete  Not Applicable  Medication Review [employer]) Complete Complete Referral to Pharmacy  PCP or Specialist appointment within 3-5 days of discharge  Complete   HRI or Home Care Consult  Complete   Palliative Care Screening  Not Applicable   Skilled Nursing Facility  Complete

## 2022-06-03 NOTE — Progress Notes (Addendum)
Palliative:  [name], Christian, is resting quietly in bed.  He appears acutely/chronically ill and very frail.  He greets me, making and somewhat keeping eye contact.  He is alert, oriented to person and situation.  I think that he can make his basic needs known.  There is no family at bedside at this time.  [name] daughter is his legal guardian.  We talk about his declining to take wound care.  When asked why, he shares that he just did not want to do it.  I shared that he has severe wounds, and that these dressing changes are extremely important.  I share that he came in with his wound infected, and he is now at a life and death situation, he must have his wounds cleaned.  At this point he states understanding.  Call to daughter/legal guardian, Christian [name]. We talk about his wounds and is declining to have wound care.  We also talk about his poor nutrition, declining to eat lunch but ate ice cream.  Christian [name] shares that she has been trying to encourage her father for nutrition to aid with healing and allowing wound care.  Christian [name] does endorse that she has shared with her father, "you must eat to live".   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  We talked about the antibiotic treatment plan.   I shared that they are facing some hard choices in the next few months.  Addendum: I shared that just like I will get sick again, so will [name], and this is not likely to be months away.  [name] agrees.  I share that she as the legal guardian/healthcare agent has the right to say what this time looks like and feels like for her father.  Christian [name] states that they are working to bring a "positivity board" when he returns to [employer].  Christian [name] is agreeable to out patient palliative services. Provider choice offered, [facility] chosen.   Conference with attending, bedside nursing staff, transition of care team related to patient condition, needs, goals of care,  disposition.  Plan: At this point continue full scope/full code.  Return to [facility] with some short-term rehab, but long-term care.  Outpatient palliative services with [facility].  55 minutes  Lillia Carmel, NP Palliative Medicine Team  Team Phone [phone] Greater than

## 2022-06-04 DIAGNOSIS — R279 Unspecified lack of coordination: Secondary | ICD-10-CM | POA: Diagnosis not present

## 2022-06-04 DIAGNOSIS — E785 Hyperlipidemia, unspecified: Secondary | ICD-10-CM | POA: Diagnosis present

## 2022-06-04 DIAGNOSIS — E669 Obesity, unspecified: Secondary | ICD-10-CM | POA: Diagnosis present

## 2022-06-04 DIAGNOSIS — T83511S Infection and inflammatory reaction due to indwelling urethral catheter, sequela: Secondary | ICD-10-CM | POA: Diagnosis not present

## 2022-06-04 DIAGNOSIS — E1140 Type 2 diabetes mellitus with diabetic neuropathy, unspecified: Secondary | ICD-10-CM | POA: Diagnosis not present

## 2022-06-04 DIAGNOSIS — G9341 Metabolic encephalopathy: Secondary | ICD-10-CM | POA: Diagnosis not present

## 2022-06-04 DIAGNOSIS — R509 Fever, unspecified: Secondary | ICD-10-CM | POA: Diagnosis not present

## 2022-06-04 DIAGNOSIS — R6520 Severe sepsis without septic shock: Secondary | ICD-10-CM | POA: Diagnosis not present

## 2022-06-04 DIAGNOSIS — Z7189 Other specified counseling: Secondary | ICD-10-CM | POA: Diagnosis not present

## 2022-06-04 DIAGNOSIS — E1122 Type 2 diabetes mellitus with diabetic chronic kidney disease: Secondary | ICD-10-CM | POA: Diagnosis not present

## 2022-06-04 DIAGNOSIS — B965 Pseudomonas (aeruginosa) (mallei) (pseudomallei) as the cause of diseases classified elsewhere: Secondary | ICD-10-CM | POA: Diagnosis present

## 2022-06-04 DIAGNOSIS — A419 Sepsis, unspecified organism: Secondary | ICD-10-CM | POA: Diagnosis not present

## 2022-06-04 DIAGNOSIS — F3132 Bipolar disorder, current episode depressed, moderate: Secondary | ICD-10-CM | POA: Diagnosis not present

## 2022-06-04 DIAGNOSIS — R339 Retention of urine, unspecified: Secondary | ICD-10-CM | POA: Diagnosis not present

## 2022-06-04 DIAGNOSIS — N390 Urinary tract infection, site not specified: Secondary | ICD-10-CM | POA: Diagnosis not present

## 2022-06-04 DIAGNOSIS — N3289 Other specified disorders of bladder: Secondary | ICD-10-CM | POA: Diagnosis not present

## 2022-06-04 DIAGNOSIS — L89152 Pressure ulcer of sacral region, stage 2: Secondary | ICD-10-CM | POA: Diagnosis not present

## 2022-06-04 DIAGNOSIS — M8618 Other acute osteomyelitis, other site: Secondary | ICD-10-CM | POA: Diagnosis not present

## 2022-06-04 DIAGNOSIS — R5381 Other malaise: Secondary | ICD-10-CM | POA: Diagnosis not present

## 2022-06-04 DIAGNOSIS — K6289 Other specified diseases of anus and rectum: Secondary | ICD-10-CM | POA: Diagnosis not present

## 2022-06-04 DIAGNOSIS — E118 Type 2 diabetes mellitus with unspecified complications: Secondary | ICD-10-CM | POA: Diagnosis not present

## 2022-06-04 DIAGNOSIS — L089 Local infection of the skin and subcutaneous tissue, unspecified: Secondary | ICD-10-CM | POA: Diagnosis not present

## 2022-06-04 DIAGNOSIS — J189 Pneumonia, unspecified organism: Secondary | ICD-10-CM | POA: Diagnosis not present

## 2022-06-04 DIAGNOSIS — E11649 Type 2 diabetes mellitus with hypoglycemia without coma: Secondary | ICD-10-CM | POA: Diagnosis not present

## 2022-06-04 DIAGNOSIS — I2699 Other pulmonary embolism without acute cor pulmonale: Secondary | ICD-10-CM | POA: Diagnosis not present

## 2022-06-04 DIAGNOSIS — R600 Localized edema: Secondary | ICD-10-CM | POA: Diagnosis not present

## 2022-06-04 DIAGNOSIS — F312 Bipolar disorder, current episode manic severe with psychotic features: Secondary | ICD-10-CM | POA: Diagnosis present

## 2022-06-04 DIAGNOSIS — I959 Hypotension, unspecified: Secondary | ICD-10-CM | POA: Diagnosis not present

## 2022-06-04 DIAGNOSIS — Z515 Encounter for palliative care: Secondary | ICD-10-CM | POA: Diagnosis not present

## 2022-06-04 DIAGNOSIS — Z20822 Contact with and (suspected) exposure to covid-19: Secondary | ICD-10-CM | POA: Diagnosis not present

## 2022-06-04 DIAGNOSIS — T148XXA Other injury of unspecified body region, initial encounter: Secondary | ICD-10-CM | POA: Diagnosis not present

## 2022-06-04 DIAGNOSIS — Z7401 Bed confinement status: Secondary | ICD-10-CM | POA: Diagnosis not present

## 2022-06-04 DIAGNOSIS — R3914 Feeling of incomplete bladder emptying: Secondary | ICD-10-CM | POA: Diagnosis not present

## 2022-06-04 DIAGNOSIS — K6389 Other specified diseases of intestine: Secondary | ICD-10-CM | POA: Diagnosis not present

## 2022-06-04 DIAGNOSIS — I130 Hypertensive heart and chronic kidney disease with heart failure and stage 1 through stage 4 chronic kidney disease, or unspecified chronic kidney disease: Secondary | ICD-10-CM | POA: Diagnosis not present

## 2022-06-04 DIAGNOSIS — M549 Dorsalgia, unspecified: Secondary | ICD-10-CM | POA: Diagnosis present

## 2022-06-04 DIAGNOSIS — M869 Osteomyelitis, unspecified: Secondary | ICD-10-CM | POA: Diagnosis not present

## 2022-06-04 DIAGNOSIS — Z6838 Body mass index (BMI) 38.0-38.9, adult: Secondary | ICD-10-CM | POA: Diagnosis not present

## 2022-06-04 DIAGNOSIS — G478 Other sleep disorders: Secondary | ICD-10-CM | POA: Diagnosis not present

## 2022-06-04 DIAGNOSIS — M4628 Osteomyelitis of vertebra, sacral and sacrococcygeal region: Secondary | ICD-10-CM | POA: Diagnosis present

## 2022-06-04 DIAGNOSIS — I878 Other specified disorders of veins: Secondary | ICD-10-CM | POA: Diagnosis present

## 2022-06-04 DIAGNOSIS — L89154 Pressure ulcer of sacral region, stage 4: Secondary | ICD-10-CM | POA: Diagnosis not present

## 2022-06-04 DIAGNOSIS — I480 Paroxysmal atrial fibrillation: Secondary | ICD-10-CM | POA: Diagnosis not present

## 2022-06-04 DIAGNOSIS — I10 Essential (primary) hypertension: Secondary | ICD-10-CM | POA: Diagnosis not present

## 2022-06-04 DIAGNOSIS — G2401 Drug induced subacute dyskinesia: Secondary | ICD-10-CM | POA: Diagnosis not present

## 2022-06-04 DIAGNOSIS — R0682 Tachypnea, not elsewhere classified: Secondary | ICD-10-CM | POA: Diagnosis present

## 2022-06-04 DIAGNOSIS — E871 Hypo-osmolality and hyponatremia: Secondary | ICD-10-CM | POA: Diagnosis present

## 2022-06-04 DIAGNOSIS — M109 Gout, unspecified: Secondary | ICD-10-CM | POA: Diagnosis present

## 2022-06-04 DIAGNOSIS — G4733 Obstructive sleep apnea (adult) (pediatric): Secondary | ICD-10-CM | POA: Diagnosis present

## 2022-06-04 DIAGNOSIS — I4891 Unspecified atrial fibrillation: Secondary | ICD-10-CM | POA: Diagnosis present

## 2022-06-04 DIAGNOSIS — I2510 Atherosclerotic heart disease of native coronary artery without angina pectoris: Secondary | ICD-10-CM | POA: Diagnosis present

## 2022-06-04 DIAGNOSIS — Z5181 Encounter for therapeutic drug level monitoring: Secondary | ICD-10-CM | POA: Diagnosis not present

## 2022-06-04 DIAGNOSIS — Z9989 Dependence on other enabling machines and devices: Secondary | ICD-10-CM | POA: Diagnosis not present

## 2022-06-04 DIAGNOSIS — N183 Chronic kidney disease, stage 3 unspecified: Secondary | ICD-10-CM | POA: Diagnosis present

## 2022-06-04 DIAGNOSIS — E1169 Type 2 diabetes mellitus with other specified complication: Secondary | ICD-10-CM | POA: Diagnosis not present

## 2022-06-04 DIAGNOSIS — I5033 Acute on chronic diastolic (congestive) heart failure: Secondary | ICD-10-CM | POA: Diagnosis not present

## 2022-06-04 DIAGNOSIS — N1831 Chronic kidney disease, stage 3a: Secondary | ICD-10-CM | POA: Diagnosis not present

## 2022-06-04 DIAGNOSIS — R000 Tachycardia, unspecified: Secondary | ICD-10-CM | POA: Diagnosis not present

## 2022-06-04 DIAGNOSIS — I509 Heart failure, unspecified: Secondary | ICD-10-CM | POA: Diagnosis not present

## 2022-06-04 LAB — CBC WITH DIFFERENTIAL/PLATELET
Abs Immature Granulocytes: 0.08 10*3/uL — ABNORMAL HIGH (ref 0.00–0.07)
Basophils Absolute: 0 10*3/uL (ref 0.0–0.1)
Basophils Relative: 0 %
Eosinophils Absolute: 0.2 10*3/uL (ref 0.0–0.5)
Eosinophils Relative: 3 %
HCT: 35.7 % — ABNORMAL LOW (ref 39.0–52.0)
Hemoglobin: 11.1 g/dL — ABNORMAL LOW (ref 13.0–17.0)
Immature Granulocytes: 1 %
Lymphocytes Relative: 17 %
Lymphs Abs: 1.2 10*3/uL (ref 0.7–4.0)
MCH: 27.1 pg (ref 26.0–34.0)
MCHC: 31.1 g/dL (ref 30.0–36.0)
MCV: 87.1 fL (ref 80.0–100.0)
Monocytes Absolute: 0.5 10*3/uL (ref 0.1–1.0)
Monocytes Relative: 7 %
Neutro Abs: 4.9 10*3/uL (ref 1.7–7.7)
Neutrophils Relative %: 72 %
Platelets: 351 10*3/uL (ref 150–400)
RBC: 4.1 MIL/uL — ABNORMAL LOW (ref 4.22–5.81)
RDW: 15.3 % (ref 11.5–15.5)
WBC: 6.8 10*3/uL (ref 4.0–10.5)
nRBC: 0 % (ref 0.0–0.2)

## 2022-06-04 LAB — BASIC METABOLIC PANEL
Anion gap: 4 — ABNORMAL LOW (ref 5–15)
BUN: 32 mg/dL — ABNORMAL HIGH (ref 8–23)
CO2: 27 mmol/L (ref 22–32)
Calcium: 9.5 mg/dL (ref 8.9–10.3)
Chloride: 102 mmol/L (ref 98–111)
Creatinine, Ser: 0.56 mg/dL — ABNORMAL LOW (ref 0.61–1.24)
GFR, Estimated: >60 mL/min (ref >60)
Glucose, Bld: 253 mg/dL — ABNORMAL HIGH (ref 70–99)
Potassium: 4.5 mmol/L (ref 3.5–5.1)
Sodium: 133 mmol/L — ABNORMAL LOW (ref 135–145)

## 2022-06-04 LAB — GLUCOSE, CAPILLARY
Glucose-Capillary: 249 mg/dL — ABNORMAL HIGH (ref 70–99)
Glucose-Capillary: 257 mg/dL — ABNORMAL HIGH (ref 70–99)
Glucose-Capillary: 267 mg/dL — ABNORMAL HIGH (ref 70–99)

## 2022-06-04 LAB — ALBUMIN: Albumin: 2.1 g/dL — ABNORMAL LOW (ref 3.5–5.0)

## 2022-06-04 LAB — MAGNESIUM: Magnesium: 1.7 mg/dL (ref 1.7–2.4)

## 2022-06-04 NOTE — Progress Notes (Signed)
Report called to [facility], patient transported back to facility via EMS in stable condition.

## 2022-06-04 NOTE — Progress Notes (Signed)
Pharmacy Antibiotic Note  Christian [name] is a 71 y.o. male admitted on 05/27/2022 with a sacral decubitus ulcer with osteo. Pharmacy consulted for fluconazole.  Plan: Fluconazole 100 mg IV daily.  Zosyn 7/3 >>7/5 Unasyn 7/5 >> Fluconazole 7/3 >> Vancomycin 6/29>7/3 Cefepime 6/29>7/3 Flagyl 6/29 >>7/3  7/1 Sacral Cx: enterococcus faecalis and proteus 6/29 Bcx: strep constellatus- pansensitive  04/11/22 Bcx: strep constellatus: int to CTX/pen   Height: 6' (182.9 cm) Weight: 101.8 kg (224 lb 6.9 oz) IBW/kg (Calculated) : 77.6  Temp (24hrs), Avg:98.7 F (37.1 C), Min:97.9 F (36.6 C), Max:99.8 F (37.7 C)  Recent Labs  Lab 05/30/22 0551 05/31/22 0609 06/01/22 0546 06/02/22 0529 06/04/22 0513  WBC 6.8 7.8 7.0 6.7 6.8  CREATININE 0.86 0.73 0.77 0.65 0.56*     Estimated Creatinine Clearance: 109.1 mL/min (A) (by C-G formula based on SCr of 0.56 mg/dL (L)).    Allergies  Allergen Reactions   Amlodipine Swelling and Other (See Comments)    Legs became swollen   Onglyza [Saxagliptin] Other (See Comments)    "Allergic," per [facility]   Zyprexa [Olanzapine] Other (See Comments)    Allergy noted by ACT Team. No reaction specified (??)- "Allergic," per [facility]   Ritalin [Methylphenidate] Anxiety and Other (See Comments)    "Maniac"    Thank you for allowing pharmacy to be a part of this patient's care.  Luan Pulling, PharmD, [facility] Clinical Pharmacist  06/04/2022 9:24 AM   **Pharmacist phone directory can now be found on [email or web address] ([email or web address]).  Listed under [facility].

## 2022-06-08 DIAGNOSIS — R5381 Other malaise: Secondary | ICD-10-CM | POA: Diagnosis not present

## 2022-06-08 DIAGNOSIS — A419 Sepsis, unspecified organism: Secondary | ICD-10-CM | POA: Diagnosis not present

## 2022-06-14 DIAGNOSIS — R3914 Feeling of incomplete bladder emptying: Secondary | ICD-10-CM | POA: Diagnosis not present

## 2022-06-29 DIAGNOSIS — F3132 Bipolar disorder, current episode depressed, moderate: Secondary | ICD-10-CM | POA: Diagnosis not present

## 2022-06-29 DIAGNOSIS — G2401 Drug induced subacute dyskinesia: Secondary | ICD-10-CM | POA: Diagnosis not present

## 2022-06-29 DIAGNOSIS — G478 Other sleep disorders: Secondary | ICD-10-CM | POA: Diagnosis not present

## 2022-06-30 ENCOUNTER — Encounter (HOSPITAL_COMMUNITY): Payer: Self-pay

## 2022-06-30 ENCOUNTER — Other Ambulatory Visit: Payer: Self-pay

## 2022-06-30 ENCOUNTER — Inpatient Hospital Stay (HOSPITAL_COMMUNITY)
Admission: EM | Admit: 2022-06-30 | Discharge: 2022-07-07 | DRG: 853 | Disposition: A | Discharge status: Skilled Nursing Facility | Payer: Medicare HMO | Source: Skilled Nursing Facility | Attending: Internal Medicine | Admitting: Internal Medicine

## 2022-06-30 ENCOUNTER — Emergency Department (HOSPITAL_COMMUNITY): Payer: Medicare HMO

## 2022-06-30 DIAGNOSIS — I2699 Other pulmonary embolism without acute cor pulmonale: Secondary | ICD-10-CM | POA: Diagnosis not present

## 2022-06-30 DIAGNOSIS — M6281 Muscle weakness (generalized): Secondary | ICD-10-CM | POA: Diagnosis not present

## 2022-06-30 DIAGNOSIS — Z888 Allergy status to other drugs, medicaments and biological substances status: Secondary | ICD-10-CM

## 2022-06-30 DIAGNOSIS — Z7401 Bed confinement status: Secondary | ICD-10-CM

## 2022-06-30 DIAGNOSIS — L089 Local infection of the skin and subcutaneous tissue, unspecified: Secondary | ICD-10-CM | POA: Diagnosis not present

## 2022-06-30 DIAGNOSIS — G9341 Metabolic encephalopathy: Secondary | ICD-10-CM | POA: Diagnosis not present

## 2022-06-30 DIAGNOSIS — Z79890 Hormone replacement therapy (postmenopausal): Secondary | ICD-10-CM

## 2022-06-30 DIAGNOSIS — Z515 Encounter for palliative care: Secondary | ICD-10-CM | POA: Diagnosis not present

## 2022-06-30 DIAGNOSIS — E669 Obesity, unspecified: Secondary | ICD-10-CM | POA: Diagnosis present

## 2022-06-30 DIAGNOSIS — K6289 Other specified diseases of anus and rectum: Secondary | ICD-10-CM | POA: Diagnosis not present

## 2022-06-30 DIAGNOSIS — N183 Chronic kidney disease, stage 3 unspecified: Secondary | ICD-10-CM | POA: Diagnosis present

## 2022-06-30 DIAGNOSIS — E11649 Type 2 diabetes mellitus with hypoglycemia without coma: Secondary | ICD-10-CM | POA: Diagnosis not present

## 2022-06-30 DIAGNOSIS — A419 Sepsis, unspecified organism: Principal | ICD-10-CM | POA: Diagnosis present

## 2022-06-30 DIAGNOSIS — I959 Hypotension, unspecified: Secondary | ICD-10-CM | POA: Diagnosis not present

## 2022-06-30 DIAGNOSIS — E1140 Type 2 diabetes mellitus with diabetic neuropathy, unspecified: Secondary | ICD-10-CM | POA: Diagnosis not present

## 2022-06-30 DIAGNOSIS — M549 Dorsalgia, unspecified: Secondary | ICD-10-CM | POA: Diagnosis present

## 2022-06-30 DIAGNOSIS — F312 Bipolar disorder, current episode manic severe with psychotic features: Secondary | ICD-10-CM | POA: Diagnosis not present

## 2022-06-30 DIAGNOSIS — L89154 Pressure ulcer of sacral region, stage 4: Secondary | ICD-10-CM | POA: Diagnosis present

## 2022-06-30 DIAGNOSIS — G4733 Obstructive sleep apnea (adult) (pediatric): Secondary | ICD-10-CM

## 2022-06-30 DIAGNOSIS — J189 Pneumonia, unspecified organism: Secondary | ICD-10-CM | POA: Diagnosis not present

## 2022-06-30 DIAGNOSIS — B965 Pseudomonas (aeruginosa) (mallei) (pseudomallei) as the cause of diseases classified elsewhere: Secondary | ICD-10-CM | POA: Diagnosis present

## 2022-06-30 DIAGNOSIS — Z20822 Contact with and (suspected) exposure to covid-19: Secondary | ICD-10-CM | POA: Diagnosis not present

## 2022-06-30 DIAGNOSIS — R279 Unspecified lack of coordination: Secondary | ICD-10-CM | POA: Diagnosis not present

## 2022-06-30 DIAGNOSIS — I130 Hypertensive heart and chronic kidney disease with heart failure and stage 1 through stage 4 chronic kidney disease, or unspecified chronic kidney disease: Secondary | ICD-10-CM | POA: Diagnosis not present

## 2022-06-30 DIAGNOSIS — Z7189 Other specified counseling: Secondary | ICD-10-CM | POA: Diagnosis not present

## 2022-06-30 DIAGNOSIS — M109 Gout, unspecified: Secondary | ICD-10-CM | POA: Diagnosis present

## 2022-06-30 DIAGNOSIS — R54 Age-related physical debility: Secondary | ICD-10-CM | POA: Diagnosis present

## 2022-06-30 DIAGNOSIS — I878 Other specified disorders of veins: Secondary | ICD-10-CM | POA: Diagnosis present

## 2022-06-30 DIAGNOSIS — R0682 Tachypnea, not elsewhere classified: Secondary | ICD-10-CM | POA: Diagnosis present

## 2022-06-30 DIAGNOSIS — Z8619 Personal history of other infectious and parasitic diseases: Secondary | ICD-10-CM

## 2022-06-30 DIAGNOSIS — E118 Type 2 diabetes mellitus with unspecified complications: Secondary | ICD-10-CM | POA: Diagnosis present

## 2022-06-30 DIAGNOSIS — Z6838 Body mass index (BMI) 38.0-38.9, adult: Secondary | ICD-10-CM | POA: Diagnosis not present

## 2022-06-30 DIAGNOSIS — Z7901 Long term (current) use of anticoagulants: Secondary | ICD-10-CM

## 2022-06-30 DIAGNOSIS — Z794 Long term (current) use of insulin: Secondary | ICD-10-CM

## 2022-06-30 DIAGNOSIS — I4891 Unspecified atrial fibrillation: Secondary | ICD-10-CM | POA: Diagnosis present

## 2022-06-30 DIAGNOSIS — Z86711 Personal history of pulmonary embolism: Secondary | ICD-10-CM

## 2022-06-30 DIAGNOSIS — Z743 Need for continuous supervision: Secondary | ICD-10-CM | POA: Diagnosis not present

## 2022-06-30 DIAGNOSIS — R000 Tachycardia, unspecified: Secondary | ICD-10-CM | POA: Diagnosis present

## 2022-06-30 DIAGNOSIS — R509 Fever, unspecified: Secondary | ICD-10-CM | POA: Diagnosis not present

## 2022-06-30 DIAGNOSIS — M4628 Osteomyelitis of vertebra, sacral and sacrococcygeal region: Secondary | ICD-10-CM | POA: Diagnosis present

## 2022-06-30 DIAGNOSIS — E785 Hyperlipidemia, unspecified: Secondary | ICD-10-CM | POA: Diagnosis present

## 2022-06-30 DIAGNOSIS — E1169 Type 2 diabetes mellitus with other specified complication: Secondary | ICD-10-CM | POA: Diagnosis not present

## 2022-06-30 DIAGNOSIS — Z79899 Other long term (current) drug therapy: Secondary | ICD-10-CM

## 2022-06-30 DIAGNOSIS — T83511S Infection and inflammatory reaction due to indwelling urethral catheter, sequela: Secondary | ICD-10-CM | POA: Diagnosis not present

## 2022-06-30 DIAGNOSIS — M869 Osteomyelitis, unspecified: Secondary | ICD-10-CM | POA: Diagnosis not present

## 2022-06-30 DIAGNOSIS — Z8673 Personal history of transient ischemic attack (TIA), and cerebral infarction without residual deficits: Secondary | ICD-10-CM

## 2022-06-30 DIAGNOSIS — I2510 Atherosclerotic heart disease of native coronary artery without angina pectoris: Secondary | ICD-10-CM | POA: Diagnosis present

## 2022-06-30 DIAGNOSIS — R600 Localized edema: Secondary | ICD-10-CM | POA: Diagnosis not present

## 2022-06-30 DIAGNOSIS — Z8249 Family history of ischemic heart disease and other diseases of the circulatory system: Secondary | ICD-10-CM

## 2022-06-30 DIAGNOSIS — I509 Heart failure, unspecified: Secondary | ICD-10-CM | POA: Diagnosis present

## 2022-06-30 DIAGNOSIS — Z86718 Personal history of other venous thrombosis and embolism: Secondary | ICD-10-CM

## 2022-06-30 DIAGNOSIS — T148XXA Other injury of unspecified body region, initial encounter: Secondary | ICD-10-CM | POA: Diagnosis not present

## 2022-06-30 DIAGNOSIS — E871 Hypo-osmolality and hyponatremia: Secondary | ICD-10-CM | POA: Diagnosis present

## 2022-06-30 DIAGNOSIS — M8618 Other acute osteomyelitis, other site: Secondary | ICD-10-CM | POA: Diagnosis not present

## 2022-06-30 DIAGNOSIS — N3289 Other specified disorders of bladder: Secondary | ICD-10-CM | POA: Diagnosis not present

## 2022-06-30 DIAGNOSIS — R339 Retention of urine, unspecified: Secondary | ICD-10-CM | POA: Diagnosis not present

## 2022-06-30 DIAGNOSIS — R6520 Severe sepsis without septic shock: Secondary | ICD-10-CM | POA: Diagnosis present

## 2022-06-30 DIAGNOSIS — E1122 Type 2 diabetes mellitus with diabetic chronic kidney disease: Secondary | ICD-10-CM | POA: Diagnosis not present

## 2022-06-30 DIAGNOSIS — N390 Urinary tract infection, site not specified: Secondary | ICD-10-CM | POA: Diagnosis not present

## 2022-06-30 DIAGNOSIS — K6389 Other specified diseases of intestine: Secondary | ICD-10-CM | POA: Diagnosis not present

## 2022-06-30 DIAGNOSIS — Z818 Family history of other mental and behavioral disorders: Secondary | ICD-10-CM

## 2022-06-30 DIAGNOSIS — Z833 Family history of diabetes mellitus: Secondary | ICD-10-CM

## 2022-06-30 DIAGNOSIS — Z9989 Dependence on other enabling machines and devices: Secondary | ICD-10-CM | POA: Diagnosis not present

## 2022-06-30 LAB — URINALYSIS, ROUTINE W REFLEX MICROSCOPIC
Bilirubin Urine: NEGATIVE
Glucose, UA: NEGATIVE mg/dL
Ketones, ur: NEGATIVE mg/dL
Nitrite: NEGATIVE
Protein, ur: 30 mg/dL — AB
Specific Gravity, Urine: 1.015 (ref 1.005–1.030)
WBC, UA: >50 WBC/hpf — ABNORMAL HIGH (ref 0–5)
pH: 7 (ref 5.0–8.0)

## 2022-06-30 LAB — RESP PANEL BY RT-PCR (FLU A&B, COVID) ARPGX2
Influenza A by PCR: NEGATIVE
Influenza B by PCR: NEGATIVE
SARS Coronavirus 2 by RT PCR: NEGATIVE

## 2022-06-30 LAB — COMPREHENSIVE METABOLIC PANEL
ALT: 68 U/L — ABNORMAL HIGH (ref 0–44)
AST: 56 U/L — ABNORMAL HIGH (ref 15–41)
Albumin: 2.4 g/dL — ABNORMAL LOW (ref 3.5–5.0)
Alkaline Phosphatase: 72 U/L (ref 38–126)
Anion gap: 9 (ref 5–15)
BUN: 36 mg/dL — ABNORMAL HIGH (ref 8–23)
CO2: 27 mmol/L (ref 22–32)
Calcium: 9.7 mg/dL (ref 8.9–10.3)
Chloride: 97 mmol/L — ABNORMAL LOW (ref 98–111)
Creatinine, Ser: 0.96 mg/dL (ref 0.61–1.24)
GFR, Estimated: >60 mL/min (ref >60)
Glucose, Bld: 168 mg/dL — ABNORMAL HIGH (ref 70–99)
Potassium: 4.5 mmol/L (ref 3.5–5.1)
Sodium: 133 mmol/L — ABNORMAL LOW (ref 135–145)
Total Bilirubin: 0.5 mg/dL (ref 0.3–1.2)
Total Protein: 8.4 g/dL — ABNORMAL HIGH (ref 6.5–8.1)

## 2022-06-30 LAB — LACTIC ACID, PLASMA
Lactic Acid, Venous: 2.1 mmol/L (ref 0.5–1.9)
Lactic Acid, Venous: 2.8 mmol/L (ref 0.5–1.9)
Lactic Acid, Venous: 0.8 mmol/L (ref 0.5–1.9)

## 2022-06-30 LAB — CBC WITH DIFFERENTIAL/PLATELET
Abs Immature Granulocytes: 0.02 10*3/uL (ref 0.00–0.07)
Basophils Absolute: 0 10*3/uL (ref 0.0–0.1)
Basophils Relative: 0 %
Eosinophils Absolute: 0.1 10*3/uL (ref 0.0–0.5)
Eosinophils Relative: 1 %
HCT: 39.7 % (ref 39.0–52.0)
Hemoglobin: 12.3 g/dL — ABNORMAL LOW (ref 13.0–17.0)
Immature Granulocytes: 0 %
Lymphocytes Relative: 13 %
Lymphs Abs: 1.2 10*3/uL (ref 0.7–4.0)
MCH: 26.9 pg (ref 26.0–34.0)
MCHC: 31 g/dL (ref 30.0–36.0)
MCV: 86.7 fL (ref 80.0–100.0)
Monocytes Absolute: 0.5 10*3/uL (ref 0.1–1.0)
Monocytes Relative: 5 %
Neutro Abs: 7.2 10*3/uL (ref 1.7–7.7)
Neutrophils Relative %: 81 %
Platelets: 483 10*3/uL — ABNORMAL HIGH (ref 150–400)
RBC: 4.58 MIL/uL (ref 4.22–5.81)
RDW: 15.9 % — ABNORMAL HIGH (ref 11.5–15.5)
WBC: 9 10*3/uL (ref 4.0–10.5)
nRBC: 0 % (ref 0.0–0.2)

## 2022-06-30 LAB — PROTIME-INR
INR: 1.1 (ref 0.8–1.2)
Prothrombin Time: 14.4 seconds (ref 11.4–15.2)

## 2022-06-30 LAB — GLUCOSE, CAPILLARY: Glucose-Capillary: 109 mg/dL — ABNORMAL HIGH (ref 70–99)

## 2022-06-30 LAB — APTT: aPTT: 29 seconds (ref 24–36)

## 2022-06-30 LAB — MRSA NEXT GEN BY PCR, NASAL: MRSA by PCR Next Gen: NOT DETECTED

## 2022-06-30 MED ORDER — GLUCERNA 1.5 CAL PO LIQD
1000.0000 mL | ORAL | Status: DC
  Administered 2022-06-30 – 2022-07-04 (×2): 1000 mL
  Filled 2022-06-30 (×12): qty 1000

## 2022-06-30 MED ORDER — INSULIN ASPART 100 UNIT/ML IJ SOLN
0.0000 [IU] | INTRAMUSCULAR | Status: DC
  Administered 2022-07-01 – 2022-07-02 (×3): 1 [IU] via SUBCUTANEOUS
  Administered 2022-07-03: 3 [IU] via SUBCUTANEOUS
  Administered 2022-07-03: 5 [IU] via SUBCUTANEOUS
  Administered 2022-07-03: 2 [IU] via SUBCUTANEOUS
  Administered 2022-07-03: 5 [IU] via SUBCUTANEOUS
  Administered 2022-07-03: 1 [IU] via SUBCUTANEOUS
  Administered 2022-07-04: 2 [IU] via SUBCUTANEOUS
  Administered 2022-07-04: 3 [IU] via SUBCUTANEOUS
  Administered 2022-07-04 (×3): 2 [IU] via SUBCUTANEOUS
  Administered 2022-07-04: 3 [IU] via SUBCUTANEOUS
  Administered 2022-07-04 – 2022-07-05 (×2): 2 [IU] via SUBCUTANEOUS
  Administered 2022-07-05: 3 [IU] via SUBCUTANEOUS
  Administered 2022-07-05: 2 [IU] via SUBCUTANEOUS
  Administered 2022-07-05 – 2022-07-06 (×5): 1 [IU] via SUBCUTANEOUS
  Administered 2022-07-06: 2 [IU] via SUBCUTANEOUS
  Administered 2022-07-06 – 2022-07-07 (×3): 1 [IU] via SUBCUTANEOUS
  Administered 2022-07-07 (×3): 2 [IU] via SUBCUTANEOUS

## 2022-06-30 MED ORDER — ONDANSETRON HCL 4 MG/2ML IJ SOLN
4.0000 mg | Freq: Once | INTRAMUSCULAR | Status: AC
  Administered 2022-06-30: 4 mg via INTRAVENOUS
  Filled 2022-06-30: qty 2

## 2022-06-30 MED ORDER — ACETAMINOPHEN 325 MG PO TABS
650.0000 mg | ORAL_TABLET | Freq: Four times a day (QID) | ORAL | Status: DC | PRN
  Administered 2022-07-03 – 2022-07-05 (×3): 650 mg
  Filled 2022-06-30 (×3): qty 2

## 2022-06-30 MED ORDER — ATORVASTATIN CALCIUM 10 MG PO TABS
10.0000 mg | ORAL_TABLET | Freq: Every day | ORAL | Status: DC
  Administered 2022-07-01 – 2022-07-06 (×6): 10 mg via ORAL
  Filled 2022-06-30 (×6): qty 1

## 2022-06-30 MED ORDER — SENNOSIDES-DOCUSATE SODIUM 8.6-50 MG PO TABS
2.0000 | ORAL_TABLET | Freq: Two times a day (BID) | ORAL | Status: DC
  Administered 2022-06-30 – 2022-07-06 (×11): 2 via ORAL
  Filled 2022-06-30 (×13): qty 2

## 2022-06-30 MED ORDER — SODIUM CHLORIDE 0.9 % IV SOLN
2.0000 g | Freq: Three times a day (TID) | INTRAVENOUS | Status: DC
  Administered 2022-06-30 – 2022-07-02 (×5): 2 g via INTRAVENOUS
  Filled 2022-06-30 (×5): qty 12.5

## 2022-06-30 MED ORDER — VANCOMYCIN HCL 1250 MG/250ML IV SOLN
1250.0000 mg | Freq: Two times a day (BID) | INTRAVENOUS | Status: DC
  Administered 2022-07-01 – 2022-07-02 (×3): 1250 mg via INTRAVENOUS
  Filled 2022-06-30 (×3): qty 250

## 2022-06-30 MED ORDER — METOPROLOL SUCCINATE ER 50 MG PO TB24
50.0000 mg | ORAL_TABLET | Freq: Every day | ORAL | Status: DC
  Administered 2022-07-01 – 2022-07-07 (×7): 50 mg via ORAL
  Filled 2022-06-30 (×7): qty 1

## 2022-06-30 MED ORDER — VANCOMYCIN HCL 2000 MG/400ML IV SOLN
2000.0000 mg | Freq: Once | INTRAVENOUS | Status: AC
  Administered 2022-06-30: 2000 mg via INTRAVENOUS
  Filled 2022-06-30: qty 400

## 2022-06-30 MED ORDER — INSULIN GLARGINE-YFGN 100 UNIT/ML ~~LOC~~ SOLN
30.0000 [IU] | Freq: Every day | SUBCUTANEOUS | Status: DC
  Administered 2022-06-30 – 2022-07-07 (×7): 30 [IU] via SUBCUTANEOUS
  Filled 2022-06-30 (×9): qty 0.3

## 2022-06-30 MED ORDER — TAMSULOSIN HCL 0.4 MG PO CAPS
0.4000 mg | ORAL_CAPSULE | Freq: Every day | ORAL | Status: DC
  Administered 2022-07-01 – 2022-07-07 (×7): 0.4 mg via ORAL
  Filled 2022-06-30 (×8): qty 1

## 2022-06-30 MED ORDER — IOHEXOL 300 MG/ML  SOLN
100.0000 mL | Freq: Once | INTRAMUSCULAR | Status: AC | PRN
  Administered 2022-06-30: 100 mL via INTRAVENOUS

## 2022-06-30 MED ORDER — SODIUM CHLORIDE 0.9 % IV SOLN
2.0000 g | Freq: Once | INTRAVENOUS | Status: AC
  Administered 2022-06-30: 2 g via INTRAVENOUS
  Filled 2022-06-30: qty 12.5

## 2022-06-30 MED ORDER — MORPHINE SULFATE (PF) 4 MG/ML IV SOLN
4.0000 mg | Freq: Once | INTRAVENOUS | Status: AC
  Administered 2022-06-30: 4 mg via INTRAVENOUS
  Filled 2022-06-30: qty 1

## 2022-06-30 MED ORDER — LACTATED RINGERS IV SOLN: INTRAVENOUS | Status: AC

## 2022-06-30 MED ORDER — VANCOMYCIN HCL 1750 MG/350ML IV SOLN: 1750.0000 mg | Freq: Two times a day (BID) | INTRAVENOUS | Status: DC

## 2022-06-30 MED ORDER — ENOXAPARIN SODIUM 150 MG/ML IJ SOSY
1.0000 mg/kg | PREFILLED_SYRINGE | Freq: Two times a day (BID) | INTRAMUSCULAR | Status: DC
  Filled 2022-06-30 (×4): qty 1

## 2022-06-30 MED ORDER — FREE WATER
150.0000 mL | Freq: Four times a day (QID) | Status: DC
Start: 2022-06-30 — End: 2022-07-07
  Administered 2022-06-30 – 2022-07-07 (×26): 150 mL

## 2022-06-30 MED ORDER — ARIPIPRAZOLE 10 MG PO TABS
10.0000 mg | ORAL_TABLET | Freq: Every morning | ORAL | Status: DC
  Administered 2022-07-01 – 2022-07-07 (×7): 10 mg via ORAL
  Filled 2022-06-30 (×7): qty 1

## 2022-06-30 MED ORDER — LACTATED RINGERS IV BOLUS
1000.0000 mL | Freq: Once | INTRAVENOUS | Status: AC
  Administered 2022-06-30: 1000 mL via INTRAVENOUS

## 2022-06-30 MED ORDER — VITAMIN D (ERGOCALCIFEROL) 1.25 MG (50000 UNIT) PO CAPS
50000.0000 [IU] | ORAL_CAPSULE | ORAL | Status: DC
  Filled 2022-06-30: qty 1

## 2022-06-30 MED ORDER — LIOTHYRONINE SODIUM 5 MCG PO TABS
10.0000 ug | ORAL_TABLET | Freq: Every day | ORAL | Status: DC
Start: 2022-07-01 — End: 2022-07-07
  Administered 2022-07-01 – 2022-07-07 (×7): 10 ug via ORAL
  Filled 2022-06-30 (×11): qty 2

## 2022-06-30 NOTE — ED Triage Notes (Signed)
Per EMS pt has stage 4 wound on his bottom. Per EMS the facility is unable to care for patient anymore due to progression of sacrum wound/

## 2022-06-30 NOTE — Sepsis Progress Note (Signed)
Notified provider of need to order repeat lactic acid. ° °

## 2022-06-30 NOTE — Sepsis Progress Note (Signed)
ELink tracking the code sepsis.  

## 2022-06-30 NOTE — Progress Notes (Signed)
Pharmacy Antibiotic Note  Christian [name] is a 71 y.o. male admitted on 06/30/2022 with sepsis and wound infection  .  Pharmacy has been consulted for Vancomycin and cefepime dosing.  Patient recently discharged 06/03/22 for sacral decubitus ulcer  and osteomyelitis on unasyn completed 06/11/22 and then augmentin to be completed 8/10.  Now with repeat admission for sepsis  Plan: Vancomycin 2000 mg IV loading dose, then 1250mg  IV q12h hr (eAUC 545) as per previous pharmacy consult Cefepime 2g q8h F/U cxs and clinical progress Monitor V/S, labs and levels as indicated  Height: 6' (182.9 cm) Weight: 128.3 kg (282 lb 13.6 oz) IBW/kg (Calculated) : 77.6  Temp (24hrs), Avg:99.5 F (37.5 C), Min:98.9 F (37.2 C), Max:100 F (37.8 C)  Recent Labs  Lab 06/30/22 1349 06/30/22 1350 06/30/22 1620  WBC  --  9.0  --   CREATININE  --  0.96  --   LATICACIDVEN 2.1*  --  2.8*     Estimated Creatinine Clearance: 102 mL/min (by C-G formula based on SCr of 0.96 mg/dL).    Allergies  Allergen Reactions   Amlodipine Swelling and Other (See Comments)    Legs became swollen   Onglyza [Saxagliptin] Other (See Comments)    "Allergic," per [facility]   Zyprexa [Olanzapine] Other (See Comments)    Allergy noted by ACT Team. No reaction specified (??)- "Allergic," per [facility]   Ritalin [Methylphenidate] Anxiety and Other (See Comments)    "Maniac"    Antimicrobials this admission: Vancomycin 8/2 >>  Cefepime 8/2 >>  Microbiology results: 8/2 BCx: pending 8/2 UCx: pending   MRSA PCR:   10/2, PharmD, BCPS 06/30/2022 7:24 PM ED Clinical Pharmacist -  [phone]

## 2022-06-30 NOTE — ED Provider Notes (Signed)
[facility] EMERGENCY DEPARTMENT Provider Note  CSN: [id number] Arrival date & time: 06/30/22 1259  Chief Complaint(s) Wound Infection  HPI Christian [name] is a 71 y.o. 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  Facility states that they are no longer able to provide appropriate medical care for this patient and thus transferred him back to the emergency department.  Patient arrives tachycardic and borderline febrile and 100 F.  Denies chest pain, shortness of breath, headache or other systemic symptoms   Past Medical History Past Medical History:  Diagnosis Date   Bilateral lower extremity edema: chronic with venous stasis changes 06/02/2014   Bipolar 1 disorder (HCC)    Bipolar disorder (HCC)    CHF (congestive heart failure) (HCC)    Chronic kidney disease    Gout    Gout    Hx of blood clots    Hypertension    Mood swings    OSA on CPAP 06/02/2014   Other and unspecified hyperlipidemia 06/02/2014   Type II or unspecified type diabetes mellitus with unspecified complication, uncontrolled 06/02/2014   Patient Active Problem List   Diagnosis Date Noted   Osteomyelitis (HCC)    Severe sepsis (HCC) 05/27/2022   Sacral decubitus ulcer, stage IV (HCC) 05/27/2022   Hypoglycemia 03/12/2022   Cellulitis in diabetic foot (HCC) 01/26/2022   Diabetic ulcer of ankle (HCC) 01/26/2022   Severe sepsis without septic shock (HCC) 04/02/2021   Lactic acidosis 04/02/2021   Hyponatremia 04/02/2021   Hyperkalemia 04/02/2021   Chronic kidney disease, stage 3b (HCC) 04/02/2021   Fever 04/02/2021   Septic shock (HCC) 04/02/2021   Altered mental status    AKI (acute kidney injury) (HCC)    CVA (cerebral vascular accident) (HCC) 10/07/2020   Type 2 diabetes mellitus with stage 3 chronic kidney disease (HCC) 04/01/2020   CAD  (coronary artery disease), native coronary artery 04/01/2020   NSTEMI (non-ST elevated myocardial infarction) (HCC) 03/31/2020   Obesity, Class III, BMI 40-49.9 (morbid obesity) (HCC) 03/31/2020   Aspiration pneumonia of both lower lobes due to gastric secretions (HCC) 03/21/2020   Sacral decubitus ulcer, stage II (HCC) 03/19/2020   Chronic diastolic CHF (congestive heart failure) (HCC) 03/17/2020   Atrial flutter, paroxysmal (HCC) 12/27/2019   Constipation 09/16/2018   Bipolar 1 disorder, depressed (HCC) 08/22/2018   Encephalopathy    Paroxysmal atrial fibrillation (HCC) 06/18/2018   History of pulmonary embolism 06/17/2018   Generalized weakness 03/27/2018   Diabetes mellitus type 2 in nonobese (HCC) 03/27/2018   Pulmonary embolism (HCC) 03/27/2018   Fall    Laceration of eyebrow    Bipolar I disorder, most recent episode depressed, severe w psychosis (HCC) 12/22/2015   Bipolar affective disorder, depressed, severe, with psychotic behavior (HCC)    Bipolar I disorder, current or most recent episode depressed, with psychotic features (HCC)    Severe bipolar I disorder with depression (HCC)    Bipolar I disorder, most recent episode depressed, severe with psychotic features (HCC)    Overdose 09/11/2015   Acute respiratory failure with hypoxia (HCC) 09/05/2015   Elevated troponin 09/05/2015   Somnolence 09/05/2015   Bipolar depression (HCC) 09/05/2015   Acute bilateral deep vein thrombosis (DVT) of femoral veins (HCC) 09/05/2015   Bilateral pulmonary embolism (HCC) 09/02/2015   Labile hypertension    Pulmonary embolism with acute cor pulmonale (HCC)  Dyspnea    Orthostatic hypotension    Bipolar disorder, in partial remission, most recent episode depressed (HCC)    Syncope, near 08/31/2015   Bipolar and related disorder (HCC)    Bipolar affective disorder, depressed, mild (HCC) 08/28/2015   Pressure ulcer 07/30/2015   Protein-calorie malnutrition, severe (HCC) 07/30/2015    Dehydration 07/29/2015   Diabetes (HCC) 07/09/2015   Urinary tract infection associated with catheterization of urinary tract (HCC) 06/19/2014   Bacteremia 06/19/2014   Bipolar affective disorder, current episode manic with psychotic symptoms (HCC) 06/17/2014   Type 2 diabetes mellitus with complication, without long-term current use of insulin (HCC) 06/02/2014   Other and unspecified hyperlipidemia 06/02/2014   OSA on CPAP 06/02/2014   Bilateral lower extremity edema: chronic with venous stasis changes 06/02/2014   Gout 06/02/2014   Hypertension    Home Medication(s) Prior to Admission medications   Medication Sig Start Date End Date Taking? Authorizing Provider  acetaminophen (TYLENOL) 500 MG tablet Take 2 tablets (1,000 mg total) by mouth every 6 (six) hours as needed for mild pain, fever or headache (pain.). 06/03/22  Yes Johnson, Clanford L, MD  amoxicillin-clavulanate (AUGMENTIN) 875-125 MG tablet Take 1 tablet by mouth every 12 (twelve) hours for 27 days. 06/11/22 07/08/22 Yes Johnson, Clanford L, MD  ARIPiprazole (ABILIFY) 10 MG tablet Take 1 tablet (10 mg total) by mouth every morning. 04/29/22  Yes Lanae Boast, MD  atorvastatin (LIPITOR) 10 MG tablet Take 1 tablet (10 mg total) by mouth daily at 6 PM. Patient taking differently: Take 10 mg by mouth every evening. 02/05/20  Yes Starkes-Perry, Juel Burrow, FNP  benztropine (COGENTIN) 1 MG tablet Take 1 tablet (1 mg total) by mouth 2 (two) times daily. 04/29/22  Yes Lanae Boast, MD  bisacodyl (DULCOLAX) 5 MG EC tablet Take 2 tablets (10 mg total) by mouth daily as needed for moderate constipation. 04/29/22  Yes Kc, Dayna Barker, MD  insulin aspart (NOVOLOG) 100 UNIT/ML FlexPen Before each meal 3 times a day, 140-199 - 2 units, 200-250 - 4 units, 251-299 - 6 units,  300-349 - 8 units,  350 or above 10 units. Insulin PEN if approved, provide syringes and needles if needed. 05/20/22  Yes Leroy Sea, MD  insulin glargine-yfgn (SEMGLEE) 100 UNIT/ML injection  Inject 0.45 mLs (45 Units total) into the skin daily. 06/03/22  Yes Johnson, Clanford L, MD  liothyronine (CYTOMEL) 5 MCG tablet Take 10 mcg by mouth daily.   Yes [provider]  melatonin 10 MG TABS Take 10 mg by mouth at bedtime as needed (sleep). 04/29/22  Yes Lanae Boast, MD  metoprolol succinate (TOPROL-XL) 50 MG 24 hr tablet Take 1 tablet (50 mg total) by mouth daily. Take with or immediately following a meal. Patient taking differently: Take 50 mg by mouth daily. 01/28/22 03/13/23 Yes Marinda Elk, MD  polyethylene glycol powder [facility]) 17 GM/SCOOP powder Take 17 g by mouth daily as needed for mild constipation. 06/03/22  Yes Johnson, Clanford L, MD  rivaroxaban (XARELTO) 20 MG TABS tablet Take 1 tablet (20 mg total) by mouth daily with supper. Patient taking differently: Take 20 mg by mouth daily. 04/02/20  Yes Dhungel, Nishant, MD  senna-docusate (SENOKOT-S) 8.6-50 MG tablet Take 2 tablets by mouth 2 (two) times daily. 04/29/22  Yes Lanae Boast, MD  tamsulosin (FLOMAX) 0.4 MG CAPS capsule Take 1 capsule (0.4 mg total) by mouth daily. 05/21/22  Yes Leroy Sea, MD  Vitamin D, Ergocalciferol, (DRISDOL) 1.25 MG (50000 UNIT)  CAPS capsule Take 50,000 Units by mouth every 7 (seven) days.   Yes [provider]  insulin aspart (NOVOLOG) 100 UNIT/ML injection Inject 0-20 Units into the skin every 6 (six) hours. Patient not taking: Reported on 06/30/2022 06/03/22   Cleora Fleet, MD  Nutritional Supplements (FEEDING SUPPLEMENT, GLUCERNA 1.5 CAL,) LIQD Place 1,000 mLs into feeding tube continuous. 1,000 mL, Per Tube, at 65 mL/hr, Continuous 06/03/22   Johnson, Clanford L, MD  Nutritional Supplements (FEEDING SUPPLEMENT, PROSOURCE TF,) liquid Place 45 mLs into feeding tube 3 (three) times daily. 05/20/22   Leroy Sea, MD  Water For Irrigation, Sterile (FREE WATER) SOLN Place 150 mLs into feeding tube every 6 (six) hours. 05/20/22   Leroy Sea, MD                                                                                                                                     Past Surgical History Past Surgical History:  Procedure Laterality Date   ABDOMINAL AORTOGRAM W/LOWER EXTREMITY N/A 09/16/2021   Procedure: ABDOMINAL AORTOGRAM W/LOWER EXTREMITY;  Surgeon: Victorino Sparrow, MD;  Location: [facility] INVASIVE CV LAB;  Service: Cardiovascular;  Laterality: N/A;   IR GASTROSTOMY TUBE MOD SED  05/17/2022   LEFT HEART CATH AND CORONARY ANGIOGRAPHY Left 04/01/2020   Procedure: LEFT HEART CATH AND CORONARY ANGIOGRAPHY;  Surgeon: Laurier Nancy, MD;  Location: [facility] INVASIVE CV LAB;  Service: Cardiovascular;  Laterality: Left;   PERIPHERAL VASCULAR BALLOON ANGIOPLASTY Left 09/16/2021   Procedure: PERIPHERAL VASCULAR BALLOON ANGIOPLASTY;  Surgeon: Victorino Sparrow, MD;  Location: [facility] INVASIVE CV LAB;  Service: Cardiovascular;  Laterality: Left;  attempted to PTA posterior tibial artery; unable to cross lesion   TEE WITHOUT CARDIOVERSION N/A 04/21/2022   Procedure: TRANSESOPHAGEAL ECHOCARDIOGRAM (TEE);  Surgeon: [name], MD;  Location: [facility] ENDOSCOPY;  Service: Cardiovascular;  Laterality: N/A;   Family History Family History  Problem Relation Age of Onset   Dementia Mother    Arthritis Father    Hypertension Father    Mental illness Sister    Diabetes Sister    Heart failure Neg Hx    Kidney failure Neg Hx    Cancer Neg Hx     Social History Social History   Tobacco Use   Smoking status: Never   Smokeless tobacco: Never  Vaping Use   Vaping Use: Never used  Substance Use Topics   Alcohol use: No   Drug use: No   Allergies Amlodipine, Onglyza [saxagliptin], Zyprexa [olanzapine], and Ritalin [methylphenidate]  Review of Systems Review of Systems  Constitutional:  Positive for chills and fever.  Musculoskeletal:  Positive for back pain.  Skin:  Positive for wound.    Physical Exam Vital Signs  I have reviewed the triage vital  signs BP 117/85   Pulse (!) 123   Temp 100 F (37.8 C) (Rectal)   Resp (!) 26  Ht 6' (1.829 m)   Wt 128.3 kg   SpO2 97%   BMI 38.36 kg/m   Physical Exam Constitutional:      General: He is not in acute distress.    Appearance: Normal appearance. He is ill-appearing.  HENT:     Head: Normocephalic and atraumatic.     Nose: No congestion or rhinorrhea.  Eyes:     General:        Right eye: No discharge.        Left eye: No discharge.     Extraocular Movements: Extraocular movements intact.     Pupils: Pupils are equal, round, and reactive to light.  Cardiovascular:     Rate and Rhythm: Regular rhythm. Tachycardia present.     Heart sounds: No murmur heard. Pulmonary:     Effort: No respiratory distress.     Breath sounds: No wheezing or rales.  Abdominal:     General: There is no distension.     Tenderness: There is no abdominal tenderness.  Musculoskeletal:        General: Normal range of motion.     Cervical back: Normal range of motion.  Skin:    General: Skin is warm and dry.     Findings: Lesion present.  Neurological:     General: No focal deficit present.     Mental Status: He is alert.      ED Results and Treatments Labs (all labs ordered are listed, but only abnormal results are displayed) Labs Reviewed  LACTIC ACID, PLASMA - Abnormal; Notable for the following components:      Result Value   Lactic Acid, Venous 2.1 (*)    All other components within normal limits  COMPREHENSIVE METABOLIC PANEL - Abnormal; Notable for the following components:   Sodium 133 (*)    Chloride 97 (*)    Glucose, Bld 168 (*)    BUN 36 (*)    Total Protein 8.4 (*)    Albumin 2.4 (*)    AST 56 (*)    ALT 68 (*)    All other components within normal limits  CBC WITH DIFFERENTIAL/PLATELET - Abnormal; Notable for the following components:   Hemoglobin 12.3 (*)    RDW 15.9 (*)    Platelets 483 (*)    All other components within normal limits  URINALYSIS, ROUTINE W  REFLEX MICROSCOPIC - Abnormal; Notable for the following components:   Color, Urine AMBER (*)    APPearance CLOUDY (*)    Hgb urine dipstick SMALL (*)    Protein, ur 30 (*)    Leukocytes,Ua LARGE (*)    WBC, UA >50 (*)    Bacteria, UA FEW (*)    All other components within normal limits  RESP PANEL BY RT-PCR (FLU A&B, COVID) ARPGX2  CULTURE, BLOOD (ROUTINE X 2)  CULTURE, BLOOD (ROUTINE X 2)  URINE CULTURE  PROTIME-INR  APTT  LACTIC ACID, PLASMA  Radiology DG Chest Port 1 View  Result Date: 06/30/2022 CLINICAL DATA:  Possible sepsis, worsening sacral wound. EXAM: PORTABLE CHEST 1 VIEW COMPARISON:  05/27/2022 and CT chest 05/27/2022. FINDINGS: Trachea is midline. Heart size is accentuated by AP semi upright technique and low lung volumes. Left basilar collapse/consolidation. Lungs are otherwise clear. IMPRESSION: Left basilar collapse/consolidation may be due to atelectasis. Difficult to exclude pneumonia. Electronically Signed   By: Leanna Battles M.D.   On: 06/30/2022 13:45    Pertinent labs & imaging results that were available during my care of the patient were reviewed by me and considered in my medical decision making (see MDM for details).  Medications Ordered in ED Medications  lactated ringers infusion ( Intravenous New Bag/Given 06/30/22 1506)  vancomycin (VANCOREADY) IVPB 2000 mg/400 mL (2,000 mg Intravenous New Bag/Given 06/30/22 1409)  vancomycin (VANCOREADY) IVPB 1250 mg/250 mL (has no administration in time range)  lactated ringers bolus 1,000 mL (0 mLs Intravenous Stopped 06/30/22 1507)  ceFEPIme (MAXIPIME) 2 g in sodium chloride 0.9 % 100 mL IVPB (0 g Intravenous Stopped 06/30/22 1507)  morphine (PF) 4 MG/ML injection 4 mg (4 mg Intravenous Given 06/30/22 1402)  ondansetron (ZOFRAN) injection 4 mg (4 mg Intravenous Given 06/30/22 1402)  iohexol (OMNIPAQUE) 300  MG/ML solution 100 mL (100 mLs Intravenous Contrast Given 06/30/22 1531)                                                                                                                                     Procedures .Critical Care  Performed by: Glendora Score, MD Authorized by: Glendora Score, MD   Critical care provider statement:    Critical care time (minutes):  75   Critical care was necessary to treat or prevent imminent or life-threatening deterioration of the following conditions:  Sepsis   Critical care was time spent personally by me on the following activities:  Development of treatment plan with patient or surrogate, discussions with consultants, evaluation of patient's response to treatment, examination of patient, ordering and review of laboratory studies, ordering and review of radiographic studies, ordering and performing treatments and interventions, pulse oximetry, re-evaluation of patient's condition and review of old charts   (including critical care time)  Medical Decision Making / ED Course   This patient presents to the ED for concern of back pain, sepsis, this involves an extensive number of treatment options, and is a complaint that carries with it a high risk of complications and morbidity.  The differential diagnosis includes osteomyelitis, sepsis, musculoskeletal back pain, abscess  MDM: Patient seen emergency room for evaluation of multiple complaints described above.  Physical exam reveals a stage IV sacral ulcer with significant drainage that is very foul-smelling.  Please see picture above.  Physical exam also with a regular tachycardia.  Laboratory evaluation with no significant leukocytosis, urinalysis with large leuk esterase greater than 50 white blood cells and is likely colonized from  his chronic indwelling Foley catheter, BUN 36, AST 56, ALT 68, lactic acid 2.1.  Patient started on broad-spectrum antibiotics and fluid resuscitation begun.  At time of  signout, patient pending CT imaging of the abdomen pelvis.  Patient will require admission but please see provider signout note for continuation of work-up to ensure the patient does not need surgical intervention.   Additional history obtained:  -External records from outside source obtained and reviewed including: Chart review including previous notes, labs, imaging, consultation notes   Lab Tests: -I ordered, reviewed, and interpreted labs.   The pertinent results include:   Labs Reviewed  LACTIC ACID, PLASMA - Abnormal; Notable for the following components:      Result Value   Lactic Acid, Venous 2.1 (*)    All other components within normal limits  COMPREHENSIVE METABOLIC PANEL - Abnormal; Notable for the following components:   Sodium 133 (*)    Chloride 97 (*)    Glucose, Bld 168 (*)    BUN 36 (*)    Total Protein 8.4 (*)    Albumin 2.4 (*)    AST 56 (*)    ALT 68 (*)    All other components within normal limits  CBC WITH DIFFERENTIAL/PLATELET - Abnormal; Notable for the following components:   Hemoglobin 12.3 (*)    RDW 15.9 (*)    Platelets 483 (*)    All other components within normal limits  URINALYSIS, ROUTINE W REFLEX MICROSCOPIC - Abnormal; Notable for the following components:   Color, Urine AMBER (*)    APPearance CLOUDY (*)    Hgb urine dipstick SMALL (*)    Protein, ur 30 (*)    Leukocytes,Ua LARGE (*)    WBC, UA >50 (*)    Bacteria, UA FEW (*)    All other components within normal limits  RESP PANEL BY RT-PCR (FLU A&B, COVID) ARPGX2  CULTURE, BLOOD (ROUTINE X 2)  CULTURE, BLOOD (ROUTINE X 2)  URINE CULTURE  PROTIME-INR  APTT  LACTIC ACID, PLASMA       Imaging Studies ordered: I ordered imaging studies including chest x-ray I independently visualized and interpreted imaging. I agree with the radiologist interpretation   Medicines ordered and prescription drug management: Meds ordered this encounter  Medications   lactated ringers infusion    lactated ringers bolus 1,000 mL   ceFEPIme (MAXIPIME) 2 g in sodium chloride 0.9 % 100 mL IVPB   morphine (PF) 4 MG/ML injection 4 mg   ondansetron (ZOFRAN) injection 4 mg   vancomycin (VANCOREADY) IVPB 2000 mg/400 mL    Order Specific Question:   Indication:    Answer:   Wound Infection   DISCONTD: vancomycin (VANCOREADY) IVPB 1750 mg/350 mL    Order Specific Question:   Indication:    Answer:   Wound Infection   vancomycin (VANCOREADY) IVPB 1250 mg/250 mL    Order Specific Question:   Indication:    Answer:   Wound Infection   iohexol (OMNIPAQUE) 300 MG/ML solution 100 mL    -I have reviewed the patients home medicines and have made adjustments as needed  Critical interventions Fluids, multiple antibiotics    Cardiac Monitoring: The patient was maintained on a cardiac monitor.  I personally viewed and interpreted the cardiac monitored which showed an underlying rhythm of: Sinus tachycardia  Social Determinants of Health:  Factors impacting patients care include: Current facility unable to care for patient's wound needs   Reevaluation: After the interventions noted above, I reevaluated the  patient and found that they have :improved  Co morbidities that complicate the patient evaluation  Past Medical History:  Diagnosis Date   Bilateral lower extremity edema: chronic with venous stasis changes 06/02/2014   Bipolar 1 disorder (HCC)    Bipolar disorder (HCC)    CHF (congestive heart failure) (HCC)    Chronic kidney disease    Gout    Gout    Hx of blood clots    Hypertension    Mood swings    OSA on CPAP 06/02/2014   Other and unspecified hyperlipidemia 06/02/2014   Type II or unspecified type diabetes mellitus with unspecified complication, uncontrolled 06/02/2014      Dispostion: I considered admission for this patient, patient will require admission but is currently pending CT imaging at time of signout.  Please see provider signout for continuation of  work-up.     Final Clinical Impression(s) / ED Diagnoses Final diagnoses:  None     @PCDICTATION @    , MD 06/30/22 1554

## 2022-06-30 NOTE — Plan of Care (Signed)
  Problem: Health Behavior/Discharge Planning: Goal: Ability to manage health-related needs will improve Outcome: Progressing   Problem: Clinical Measurements: Goal: Respiratory complications will improve Outcome: Not Applicable Goal: Cardiovascular complication will be avoided Outcome: Progressing   Problem: Nutrition: Goal: Adequate nutrition will be maintained Outcome: Progressing   Problem: Pain Managment: Goal: General experience of comfort will improve Outcome: Progressing   Problem: Safety: Goal: Ability to remain free from injury will improve Outcome: Progressing

## 2022-06-30 NOTE — Sepsis Progress Note (Signed)
Notified bedside nurse of need to draw lactic acid and blood cultures.  

## 2022-06-30 NOTE — ED Provider Notes (Signed)
3:15 PM checkout for Dr. Tinnie Gens to evaluate patient after return of imaging.  It is suspected that the patient is septic from sacral decubitus.  CT was ordered to evaluate the wound.  Clinically the patient's sacral decubitus is very deep, and has foul-smelling drainage.  Apparently there are issues with his care, at his facility, and it is felt that his needs are not being met there.  4:25 PM-call received from radiology, regarding CT imaging.  CT shows worsening sacral decubitus with osteomyelitis of the sacrum.  Also incidental note was made of urinary bladder distention and suspected Foley balloon in the urethra.  According to nursing patient has only had 50 cc of urine in urinary bladder today.  At this time patient's abdomen is nontender.  Urinary bladder is palpable in the suprapubic area.  I will ask nursing to change the Foley catheter out.  5:50 PM-Case discussed with hospitalist who agrees to admit.  Foley catheter has been replaced and draining.       Daleen Bo, MD 06/30/22 [phone]

## 2022-06-30 NOTE — Sepsis Progress Note (Signed)
Notified bedside nurse of need to administer antibiotics.  

## 2022-06-30 NOTE — H&P (Signed)
TRH H&P   Patient Demographics:    Tywaun Hiltner, is a 71 y.o. male  MRN: [id number]   DOB - [birth year]  Admit Date - 06/30/2022  Outpatient Primary MD for the patient is Charlynne Pander, MD  Referring MD/NP/PA: Dr Effie Shy  Patient coming from: [facility] rehab  Chief Complaint  Patient presents with   Wound Infection      HPI:    Fabrice Dyal  is a 71 y.o. male, with medical history significant for diabetes mellitus, CVA, bipolar disorder, coronary artery disease, PE and DVT, CKD 3, atrial fibrillation, OSA, gastrostomy tube and chronic Foley catheter, patient with multiple recent prolonged hospitalization, most recently with severe sepsis, due to strep constellatus bacteremia and sacral osteomyelitis, currently receiving Augmentin, patient was sent by facility with worsening wound infection, facility states that they are no longer able to provide appropriate medical care for this patient, and thus they transferred him back to the hospital, patient was tachycardic, and borderline febrile of 100 Fahrenheit on admission, he denies any chest pain, shortness of breath, headache. - in ED evaded lactic acid at 2.8, thrombocytosis at 483 K, meeting significant for worsening sacral pressure ulcer with osteomyelitis, he was tachycardic as well, sepsis protocol initiated and [facility] hospitalist consulted to admit.    Review of systems:     A full 10 point Review of Systems was done, except as stated above, all other Review of Systems were negative.   With Past History of the following :    Past Medical History:  Diagnosis Date   Bilateral lower extremity edema: chronic with venous stasis changes 06/02/2014   Bipolar 1 disorder (HCC)    Bipolar disorder (HCC)    CHF (congestive heart failure) (HCC)    Chronic kidney disease    Gout    Gout    Hx of blood clots    Hypertension    Mood  swings    OSA on CPAP 06/02/2014   Other and unspecified hyperlipidemia 06/02/2014   Type II or unspecified type diabetes mellitus with unspecified complication, uncontrolled 06/02/2014      Past Surgical History:  Procedure Laterality Date   ABDOMINAL AORTOGRAM W/LOWER EXTREMITY N/A 09/16/2021   Procedure: ABDOMINAL AORTOGRAM W/LOWER EXTREMITY;  Surgeon: Victorino Sparrow, MD;  Location: [facility] INVASIVE CV LAB;  Service: Cardiovascular;  Laterality: N/A;   IR GASTROSTOMY TUBE MOD SED  05/17/2022   LEFT HEART CATH AND CORONARY ANGIOGRAPHY Left 04/01/2020   Procedure: LEFT HEART CATH AND CORONARY ANGIOGRAPHY;  Surgeon: Laurier Nancy, MD;  Location: [facility] INVASIVE CV LAB;  Service: Cardiovascular;  Laterality: Left;   PERIPHERAL VASCULAR BALLOON ANGIOPLASTY Left 09/16/2021   Procedure: PERIPHERAL VASCULAR BALLOON ANGIOPLASTY;  Surgeon: Victorino Sparrow, MD;  Location: [facility] INVASIVE CV LAB;  Service: Cardiovascular;  Laterality: Left;  attempted to PTA posterior tibial artery; unable to cross lesion   TEE WITHOUT CARDIOVERSION  N/A 04/21/2022   Procedure: TRANSESOPHAGEAL ECHOCARDIOGRAM (TEE);  Surgeon: [name], MD;  Location: [facility] ENDOSCOPY;  Service: Cardiovascular;  Laterality: N/A;      Social History:     Social History   Tobacco Use   Smoking status: Never   Smokeless tobacco: Never  Substance Use Topics   Alcohol use: No      Family History :     Family History  Problem Relation Age of Onset   Dementia Mother    Arthritis Father    Hypertension Father    Mental illness Sister    Diabetes Sister    Heart failure Neg Hx    Kidney failure Neg Hx    Cancer Neg Hx      Home Medications:   Prior to Admission medications   Medication Sig Start Date End Date Taking? Authorizing Provider  acetaminophen (TYLENOL) 500 MG tablet Take 2 tablets (1,000 mg total) by mouth every 6 (six) hours as needed for mild pain, fever or headache (pain.). 06/03/22  Yes Johnson, Clanford L, MD   amoxicillin-clavulanate (AUGMENTIN) 875-125 MG tablet Take 1 tablet by mouth every 12 (twelve) hours for 27 days. 06/11/22 07/08/22 Yes Johnson, Clanford L, MD  ARIPiprazole (ABILIFY) 10 MG tablet Take 1 tablet (10 mg total) by mouth every morning. 04/29/22  Yes Lanae Boast, MD  atorvastatin (LIPITOR) 10 MG tablet Take 1 tablet (10 mg total) by mouth daily at 6 PM. Patient taking differently: Take 10 mg by mouth every evening. 02/05/20  Yes Starkes-Perry, Juel Burrow, FNP  benztropine (COGENTIN) 1 MG tablet Take 1 tablet (1 mg total) by mouth 2 (two) times daily. 04/29/22  Yes Lanae Boast, MD  bisacodyl (DULCOLAX) 5 MG EC tablet Take 2 tablets (10 mg total) by mouth daily as needed for moderate constipation. 04/29/22  Yes Kc, Dayna Barker, MD  insulin aspart (NOVOLOG) 100 UNIT/ML FlexPen Before each meal 3 times a day, 140-199 - 2 units, 200-250 - 4 units, 251-299 - 6 units,  300-349 - 8 units,  350 or above 10 units. Insulin PEN if approved, provide syringes and needles if needed. 05/20/22  Yes Leroy Sea, MD  insulin glargine-yfgn (SEMGLEE) 100 UNIT/ML injection Inject 0.45 mLs (45 Units total) into the skin daily. 06/03/22  Yes Johnson, Clanford L, MD  liothyronine (CYTOMEL) 5 MCG tablet Take 10 mcg by mouth daily.   Yes [provider]  melatonin 10 MG TABS Take 10 mg by mouth at bedtime as needed (sleep). 04/29/22  Yes Lanae Boast, MD  metoprolol succinate (TOPROL-XL) 50 MG 24 hr tablet Take 1 tablet (50 mg total) by mouth daily. Take with or immediately following a meal. Patient taking differently: Take 50 mg by mouth daily. 01/28/22 03/13/23 Yes Marinda Elk, MD  polyethylene glycol powder [employer]) 17 GM/SCOOP powder Take 17 g by mouth daily as needed for mild constipation. 06/03/22  Yes Johnson, Clanford L, MD  rivaroxaban (XARELTO) 20 MG TABS tablet Take 1 tablet (20 mg total) by mouth daily with supper. Patient taking differently: Take 20 mg by mouth daily. 04/02/20  Yes Dhungel, Nishant,  MD  senna-docusate (SENOKOT-S) 8.6-50 MG tablet Take 2 tablets by mouth 2 (two) times daily. 04/29/22  Yes Lanae Boast, MD  tamsulosin (FLOMAX) 0.4 MG CAPS capsule Take 1 capsule (0.4 mg total) by mouth daily. 05/21/22  Yes Leroy Sea, MD  Vitamin D, Ergocalciferol, (DRISDOL) 1.25 MG (50000 UNIT) CAPS capsule Take 50,000 Units by mouth every 7 (seven) days.  Yes [provider]  insulin aspart (NOVOLOG) 100 UNIT/ML injection Inject 0-20 Units into the skin every 6 (six) hours. Patient not taking: Reported on 06/30/2022 06/03/22   Cleora Fleet, MD  Nutritional Supplements (FEEDING SUPPLEMENT, GLUCERNA 1.5 CAL,) LIQD Place 1,000 mLs into feeding tube continuous. 1,000 mL, Per Tube, at 65 mL/hr, Continuous 06/03/22   Johnson, Clanford L, MD  Nutritional Supplements (FEEDING SUPPLEMENT, PROSOURCE TF,) liquid Place 45 mLs into feeding tube 3 (three) times daily. 05/20/22   Leroy Sea, MD  Water For Irrigation, Sterile (FREE WATER) SOLN Place 150 mLs into feeding tube every 6 (six) hours. 05/20/22   Leroy Sea, MD     Allergies:     Allergies  Allergen Reactions   Amlodipine Swelling and Other (See Comments)    Legs became swollen   Onglyza [Saxagliptin] Other (See Comments)    "Allergic," per [facility]   Zyprexa [Olanzapine] Other (See Comments)    Allergy noted by ACT Team. No reaction specified (??)- "Allergic," per [facility]   Ritalin [Methylphenidate] Anxiety and Other (See Comments)    "Maniac"     Physical Exam:   Vitals  Blood pressure (!) 114/92, pulse (!) 126, temperature 98.9 F (37.2 C), temperature source Oral, resp. rate 20, height 6' (1.829 m), weight 128.3 kg, SpO2 96 %.   1. General frail, chronically appearing male, laying in bed, no apparent distress  2.  Awake, interactive, answering simple questions  3. No F.N deficits, ALL C.Nerves Intact, generalized weakness, nothing focal  4. Ears and Eyes appear Normal,  Conjunctivae clear, PERRLA. Moist Oral Mucosa.  5. Supple Neck, No JVD, No cervical lymphadenopathy appriciated, No Carotid Bruits.  6. Symmetrical Chest wall movement, Good air movement bilaterally, CTAB.  7. Tachycardic, No Gallops, Rubs or Murmurs, No Parasternal Heave.  8. Positive Bowel Sounds, Abdomen Soft,PEG+, No tenderness, No organomegaly appriciated,No rebound -guarding or rigidity.  9.  Infected sacral pressure ulcer, see picture below      Data Review:    CBC Recent Labs  Lab 06/30/22 1350  WBC 9.0  HGB 12.3*  HCT 39.7  PLT 483*  MCV 86.7  MCH 26.9  MCHC 31.0  RDW 15.9*  LYMPHSABS 1.2  MONOABS 0.5  EOSABS 0.1  BASOSABS 0.0   ------------------------------------------------------------------------------------------------------------------  Chemistries  Recent Labs  Lab 06/30/22 1350  NA 133*  K 4.5  CL 97*  CO2 27  GLUCOSE 168*  BUN 36*  CREATININE 0.96  CALCIUM 9.7  AST 56*  ALT 68*  ALKPHOS 72  BILITOT 0.5   ------------------------------------------------------------------------------------------------------------------ estimated creatinine clearance is 102 mL/min (by C-G formula based on SCr of 0.96 mg/dL). ------------------------------------------------------------------------------------------------------------------ No results for input(s): "TSH", "T4TOTAL", "T3FREE", "THYROIDAB" in the last 72 hours.  Invalid input(s): "FREET3"  Coagulation profile Recent Labs  Lab 06/30/22 1350  INR 1.1   ------------------------------------------------------------------------------------------------------------------- No results for input(s): "DDIMER" in the last 72 hours. -------------------------------------------------------------------------------------------------------------------  Cardiac Enzymes No results for input(s): "CKMB", "TROPONINI", "MYOGLOBIN" in the last 168 hours.  Invalid input(s):  "CK" ------------------------------------------------------------------------------------------------------------------    Component Value Date/Time   BNP 552.3 (H) 04/22/2022 0359     ---------------------------------------------------------------------------------------------------------------  Urinalysis    Component Value Date/Time   COLORURINE AMBER (A) 06/30/2022 1317   APPEARANCEUR CLOUDY (A) 06/30/2022 1317   APPEARANCEUR Cloudy 08/01/2014 1614   LABSPEC 1.015 06/30/2022 1317   LABSPEC 1.008 08/01/2014 1614   PHURINE 7.0 06/30/2022 1317   GLUCOSEU NEGATIVE 06/30/2022 1317   GLUCOSEU Negative 08/01/2014  1614   HGBUR SMALL (A) 06/30/2022 1317   BILIRUBINUR NEGATIVE 06/30/2022 1317   BILIRUBINUR Negative 08/01/2014 1614   KETONESUR NEGATIVE 06/30/2022 1317   PROTEINUR 30 (A) 06/30/2022 1317   UROBILINOGEN 0.2 07/01/2014 1809   NITRITE NEGATIVE 06/30/2022 1317   LEUKOCYTESUR LARGE (A) 06/30/2022 1317   LEUKOCYTESUR Negative 08/01/2014 1614    ----------------------------------------------------------------------------------------------------------------   Imaging Results:    CT ABDOMEN PELVIS W CONTRAST  Result Date: 06/30/2022 CLINICAL DATA:  Sepsis, concern for sacral osteomyelitis EXAM: CT ABDOMEN AND PELVIS WITH CONTRAST TECHNIQUE: Multidetector CT imaging of the abdomen and pelvis was performed using the standard protocol following bolus administration of intravenous contrast. RADIATION DOSE REDUCTION: This exam was performed according to the departmental dose-optimization program which includes automated exposure control, adjustment of the mA and/or kV according to patient size and/or use of iterative reconstruction technique. CONTRAST:  OMNIPAQUE IOHEXOL 300 MG/ML  SOLN COMPARISON:  CT abdomen and pelvis 05/27/2022 FINDINGS: Lower chest: Bibasilar scarring/atelectasis. Trace left pleural effusion. Cardiomegaly. Coronary atherosclerosis and/or stenting. No  pericardial effusion. Hepatobiliary: No focal liver abnormality is seen. No gallstones, gallbladder wall thickening, or biliary dilatation. Pancreas: Unremarkable. No pancreatic ductal dilatation or surrounding inflammatory changes. Spleen: Normal in size without focal abnormality. Adrenals/Urinary Tract: Decreased size of the extrarenal pelvis on the left. No hydronephrosis or perinephric stranding. Adrenal glands are unremarkable. Marked distention of the urinary bladder. The Foley balloon appears to be within the urethra. Stomach/Bowel: Stomach is within normal limits. Appendix appears normal. No evidence of bowel wall thickening, distention, or inflammatory changes. Large colonic stool load. Rectal wall thickening. Large stool ball in the rectum. PEG tube. Vascular/Lymphatic: Aortoiliac atherosclerotic calcification. No aneurysm. No suspicious abdominopelvic adenopathy. Reproductive: Prostate is unremarkable. Other: No free intraperitoneal air. Musculoskeletal: Redemonstrated sacral decubitus ulcer overlying the sacrum and coccyx and extending inferiorly along the gluteal cleft. The area of ulceration measures approximally 4.2 cm in width and 12.1 cm in craniocaudal dimension. The ulcer extends to the inferior portion of the sacrum and coccyx where there is cortical disruption and osseous resorption compatible with sacral osteomyelitis, progressed from 05/27/2022. Adjacent inflammatory fat stranding within the subcutaneous tissues of the back. Additional soft tissue thickening and edema/stranding within the presacral space. No drainable fluid collection. IMPRESSION: 1. Sacral decubitus ulcer. The ulcer extends to distal sacrum and coccyx where there is increasing erosive change since 05/27/2022 compatible osteomyelitis. 2. Persistent rectal wall thickening and surrounding inflammation consistent with proctitis. 3. Marked distention of the bladder with Foley balloon in the urethra concerning for bladder outlet  obstruction. 4. Aortic Atherosclerosis (ICD10-I70.0). These results were called by telephone at the time of interpretation on 06/30/2022 at 4:25 pm to provider Dr. Effie Shy, who verbally acknowledged these results. Electronically Signed   By: Minerva Fester M.D.   On: 06/30/2022 16:52   DG Chest Port 1 View  Result Date: 06/30/2022 CLINICAL DATA:  Possible sepsis, worsening sacral wound. EXAM: PORTABLE CHEST 1 VIEW COMPARISON:  05/27/2022 and CT chest 05/27/2022. FINDINGS: Trachea is midline. Heart size is accentuated by AP semi upright technique and low lung volumes. Left basilar collapse/consolidation. Lungs are otherwise clear. IMPRESSION: Left basilar collapse/consolidation may be due to atelectasis. Difficult to exclude pneumonia. Electronically Signed   By: Leanna Battles M.D.   On: 06/30/2022 13:45    My personal review of EKG: Rhythm : sinus tachycardia, Rate  128 /min, QTc 440 , no Acute ST changes   Assessment & Plan:    Principal Problem:   Sepsis (  HCC) Active Problems:   Bipolar affective disorder, current episode manic with psychotic symptoms (HCC)   Sacral decubitus ulcer, stage IV (HCC)   Type 2 diabetes mellitus with complication, without long-term current use of insulin (HCC)   OSA on CPAP   Bilateral lower extremity edema: chronic with venous stasis changes   Gout   Severe sepsis, present on admission -Sepsis present on admission, tachypnea, tachycardia, fever and lethargy with elevated lactic acid -Source of sepsis secondary to infected pressure ulcer, with underlying osteomyelitis -During recent admission patient did grow Streptococcus constellatus, he was treated with Unasyn, and still receiving Augmentin in the facility. -All cultures were sent, follow on final results. -Septic despite being on Augmentin, will broaden antibiotic coverage to vancomycin and cefepime. -Continue with IV fluids, trend lactic acid, follow-up blood cultures  Sacral pressure ulcer, stage IV,  infected with underlining osteomyelitis -General surgery will be consulted as likely will need debridement -Xarelto for now if he needs debridement    Malfunction indwelling Foley catheter -Imaging showing balloon inflated in the urethra, Foley catheter was discontinued and new 1 inserted while in ED  Pulmonary embolism -Hold Xarelto, will keep in Lovenox pending evaluation by general surgery possible need for surgical debridement  Type 2 diabetes mellitus -A1c most recently 7.6 on recent admission, will start on lower dose Semglee 30 units instead of 45, and will keep on SSI scale every 4 hours  Hypertension -Pressure is soft, b will hold metoprolol  Obesity Body mass index is 38.36 kg/m.     Holes of care: -Have discussed with his guardian [name] about overall poor long-term prognosis given his bedbound and recurrent sacral pressure infection and osteomyelitis, and I have informed her I will request palliative medicine consult  DVT Prophylaxis XARELRO >> lovenox  AM Labs Ordered, also please review Full Orders  Family Communication: Admission, patients condition and plan of care including tests being ordered have been discussed with the patient and legal guardian [name] who indicate understanding and agree with the plan and Code Status.  Code Status full code  Likely DC to  back to SNF  Condition GUARDED    Consults called: none, gen surgery requested in Epic.    Admission status: inpatient    Time spent in minutes : 70 minutes   Huey Bienenstock M.D on 06/30/2022 at 7:21 PM   [facility] - Office  [phone]

## 2022-06-30 NOTE — Progress Notes (Addendum)
Pharmacy Antibiotic Note  Christian [name] is a 71 y.o. male admitted on 06/30/2022 with  wound infection  .  Pharmacy has been consulted for Vancomycin dosing. Patient recently discharged 06/03/22 for sacral decubitus ulcer  and osteomyelitis on unasyn completed 06/11/22 and then augmentin to be completed 8/10.  Now with repeat admission for sepsis  Plan: Vancomycin 2000 mg IV loading dose, then 1250mg  IV q12h hrs. Goal AUC 400-550. Expected AUC: 545 SCr used: 0.96  F/U cxs and clinical progress Monitor V/S, labs and levels as indicated  Height: 6' (182.9 cm) Weight: 128.3 kg (282 lb 13.6 oz) IBW/kg (Calculated) : 77.6  Temp (24hrs), Avg:100 F (37.8 C), Min:100 F (37.8 C), Max:100 F (37.8 C)  Recent Labs  Lab 06/30/22 1349 06/30/22 1350  WBC  --  9.0  CREATININE  --  0.96  LATICACIDVEN 2.1*  --     Estimated Creatinine Clearance: 102 mL/min (by C-G formula based on SCr of 0.96 mg/dL).    Allergies  Allergen Reactions   Amlodipine Swelling and Other (See Comments)    Legs became swollen   Onglyza [Saxagliptin] Other (See Comments)    "Allergic," per [facility]   Zyprexa [Olanzapine] Other (See Comments)    Allergy noted by ACT Team. No reaction specified (??)- "Allergic," per [facility]   Ritalin [Methylphenidate] Anxiety and Other (See Comments)    "Maniac"    Antimicrobials this admission: Vancomycin 8/2 >>  Cefepime 2gm IV x 1 in ED 8/2   Microbiology results: 8/2 BCx: pending 8/2 UCx: pending   MRSA PCR:   Thank you for allowing pharmacy to be a part of this patient's care. [facility], BS Pharm D, BCPS Clinical Pharmacist 06/30/2022 2:42 PM

## 2022-06-30 NOTE — Progress Notes (Signed)
ANTICOAGULATION CONSULT NOTE - Initial Consult  Pharmacy Consult for Enoxaparin Indication: pulmonary embolus  Allergies  Allergen Reactions   Amlodipine Swelling and Other (See Comments)    Legs became swollen   Onglyza [Saxagliptin] Other (See Comments)    "Allergic," per [facility]   Zyprexa [Olanzapine] Other (See Comments)    Allergy noted by ACT Team. No reaction specified (??)- "Allergic," per [facility]   Ritalin [Methylphenidate] Anxiety and Other (See Comments)    "Maniac"    Patient Measurements: Height: 6' (182.9 cm) Weight: 128.3 kg (282 lb 13.6 oz) IBW/kg (Calculated) : 77.6  Vital Signs: Temp: 98.9 F (37.2 C) (08/02 1727) Temp Source: Oral (08/02 1727) BP: 114/92 (08/02 1715) Pulse Rate: 126 (08/02 1715)  Labs: Recent Labs    06/30/22 1350  HGB 12.3*  HCT 39.7  PLT 483*  APTT 29  LABPROT 14.4  INR 1.1  CREATININE 0.96    Estimated Creatinine Clearance: 102 mL/min (by C-G formula based on SCr of 0.96 mg/dL).   Medical History: Past Medical History:  Diagnosis Date   Bilateral lower extremity edema: chronic with venous stasis changes 06/02/2014   Bipolar 1 disorder (HCC)    Bipolar disorder (HCC)    CHF (congestive heart failure) (HCC)    Chronic kidney disease    Gout    Gout    Hx of blood clots    Hypertension    Mood swings    OSA on CPAP 06/02/2014   Other and unspecified hyperlipidemia 06/02/2014   Type II or unspecified type diabetes mellitus with unspecified complication, uncontrolled 06/02/2014    Medications:  (Not in a hospital admission)  Scheduled:   insulin aspart  0-9 Units Subcutaneous Q4H   Infusions:   ceFEPime (MAXIPIME) IV     lactated ringers 150 mL/hr at 06/30/22 1506   [START ON 07/01/2022] vancomycin     PRN:   Assessment: 70 yom with a history of DM, CVA, bipolar, CAD, PE and DVT, CKD 3, Afib, OSA, gastrostomy tube and chronic Foley catheter presenting with sepsis. Recently discharged 06/03/22 for  sacral decubitus ulcer  and osteomyelitis on unasyn completed 06/11/22 and then augmentin to be completed 8/10.  Enoxaparin per pharmacy placed for PE, holding Xarelto for possible need for surgery. On Xarelto prior to admission. Last taken 8/1 1600.  Hgb 12.3; plt 483 aPTT 29 PT / INR 14.4 / 1.1  Estimated Creatinine Clearance: 102 mL/min (by C-G formula based on SCr of 0.96 mg/dL).  Goal of Therapy:   Monitor platelets by anticoagulation protocol: Yes   Plan:  Enoxaparin 129 mg q12h (1 mg/kg q12h) Monitor for s/s of hemorrhage and CBC F/u plan for xarelto re-start  Delmar Landau, PharmD, BCPS 06/30/2022 7:38 PM ED Clinical Pharmacist -  [phone]

## 2022-07-01 ENCOUNTER — Encounter (HOSPITAL_COMMUNITY): Payer: Self-pay | Admitting: Internal Medicine

## 2022-07-01 DIAGNOSIS — L89154 Pressure ulcer of sacral region, stage 4: Secondary | ICD-10-CM | POA: Diagnosis not present

## 2022-07-01 DIAGNOSIS — A419 Sepsis, unspecified organism: Secondary | ICD-10-CM | POA: Diagnosis not present

## 2022-07-01 DIAGNOSIS — Z515 Encounter for palliative care: Secondary | ICD-10-CM

## 2022-07-01 DIAGNOSIS — Z7189 Other specified counseling: Secondary | ICD-10-CM

## 2022-07-01 DIAGNOSIS — M869 Osteomyelitis, unspecified: Secondary | ICD-10-CM | POA: Diagnosis not present

## 2022-07-01 LAB — BLOOD CULTURE ID PANEL (REFLEXED) - BCID2

## 2022-07-01 LAB — BASIC METABOLIC PANEL
Anion gap: 3 — ABNORMAL LOW (ref 5–15)
BUN: 27 mg/dL — ABNORMAL HIGH (ref 8–23)
CO2: 28 mmol/L (ref 22–32)
Calcium: 9.1 mg/dL (ref 8.9–10.3)
Chloride: 102 mmol/L (ref 98–111)
Creatinine, Ser: 0.75 mg/dL (ref 0.61–1.24)
GFR, Estimated: >60 mL/min (ref >60)
Glucose, Bld: 105 mg/dL — ABNORMAL HIGH (ref 70–99)
Potassium: 4.6 mmol/L (ref 3.5–5.1)
Sodium: 133 mmol/L — ABNORMAL LOW (ref 135–145)

## 2022-07-01 LAB — GLUCOSE, CAPILLARY
Glucose-Capillary: 102 mg/dL — ABNORMAL HIGH (ref 70–99)
Glucose-Capillary: 104 mg/dL — ABNORMAL HIGH (ref 70–99)
Glucose-Capillary: 117 mg/dL — ABNORMAL HIGH (ref 70–99)
Glucose-Capillary: 119 mg/dL — ABNORMAL HIGH (ref 70–99)
Glucose-Capillary: 121 mg/dL — ABNORMAL HIGH (ref 70–99)
Glucose-Capillary: 90 mg/dL (ref 70–99)

## 2022-07-01 LAB — CBC
HCT: 31.8 % — ABNORMAL LOW (ref 39.0–52.0)
Hemoglobin: 9.9 g/dL — ABNORMAL LOW (ref 13.0–17.0)
MCH: 26.9 pg (ref 26.0–34.0)
MCHC: 31.1 g/dL (ref 30.0–36.0)
MCV: 86.4 fL (ref 80.0–100.0)
Platelets: 335 10*3/uL (ref 150–400)
RBC: 3.68 MIL/uL — ABNORMAL LOW (ref 4.22–5.81)
RDW: 15.7 % — ABNORMAL HIGH (ref 11.5–15.5)
WBC: 6.6 10*3/uL (ref 4.0–10.5)
nRBC: 0 % (ref 0.0–0.2)

## 2022-07-01 MED ORDER — ADULT MULTIVITAMIN LIQUID CH
15.0000 mL | Freq: Every day | ORAL | Status: DC
  Administered 2022-07-01 – 2022-07-07 (×7): 15 mL via ORAL
  Filled 2022-07-01 (×11): qty 15

## 2022-07-01 MED ORDER — COLLAGENASE 250 UNIT/GM EX OINT
TOPICAL_OINTMENT | Freq: Two times a day (BID) | CUTANEOUS | Status: DC
  Administered 2022-07-03: 1 via TOPICAL
  Filled 2022-07-01 (×2): qty 30

## 2022-07-01 MED ORDER — CHLORHEXIDINE GLUCONATE CLOTH 2 % EX PADS
6.0000 | MEDICATED_PAD | Freq: Every day | CUTANEOUS | Status: DC
  Administered 2022-07-01 – 2022-07-07 (×7): 6 via TOPICAL

## 2022-07-01 MED ORDER — ENOXAPARIN SODIUM 100 MG/ML IJ SOSY
100.0000 mg | PREFILLED_SYRINGE | Freq: Two times a day (BID) | INTRAMUSCULAR | Status: DC
  Administered 2022-07-01 – 2022-07-02 (×2): 100 mg via SUBCUTANEOUS
  Filled 2022-07-01 (×2): qty 1

## 2022-07-01 MED ORDER — PROSOURCE TF PO LIQD
45.0000 mL | Freq: Two times a day (BID) | ORAL | Status: DC
  Administered 2022-07-01 – 2022-07-06 (×11): 45 mL
  Filled 2022-07-01 (×13): qty 45

## 2022-07-01 MED ORDER — ADULT MULTIVITAMIN LIQUID CH: 5.0000 mL | Freq: Every day | ORAL | Status: DC

## 2022-07-01 MED ORDER — DAKINS (1/4 STRENGTH) 0.125 % EX SOLN
Freq: Two times a day (BID) | CUTANEOUS | Status: DC
Start: 2022-07-01 — End: 2022-07-07
  Administered 2022-07-02 – 2022-07-03 (×2): 1
  Filled 2022-07-01 (×3): qty 473

## 2022-07-01 MED ORDER — SILVER NITRATE-POT NITRATE 75-25 % EX MISC
5.0000 | Freq: Once | CUTANEOUS | Status: AC
  Administered 2022-07-01: 5 via TOPICAL
  Filled 2022-07-01: qty 10

## 2022-07-01 NOTE — Consult Note (Signed)
[facility] Consult  Reason for Consult: Infected sacral pressure ulcer, please evaluate need for debridement Referring Physician: Dr. Cornell Barman  Chief Complaint   Wound Infection     HPI: Christian [name] is a 71 y.o. male who presented to the hospital with worsening sacral wound and concern for infection.  He is got a past medical history significant for DM, CVA, bipolar disorder, CAD, PE and DVT on Xarelto, A-fib, OSA, G-tube and chronic Foley.  He was recently admitted to the hospital with bacteremia and sacral osteomyelitis.  He was sent in because his facility was concerned his wound was getting worse, and they could not appropriately care for it.  General surgery was consulted for evaluation of this wound to see if it requires further debridement.  Patient is able to answer some questions.  He does complain of pain in his buttock associated with his wound.  He has no other complaints at this time.  He confirms that he last received his Xarelto yesterday, and has not yet received his Lovenox.  In the ED, he was noted to not have a leukocytosis.  His lactic acid was initially elevated up to 2.8, but has subsequently normalized to 0.8.  He was tachycardic with some tachypnea.  He underwent a CT of the abdomen pelvis which demonstrated a sacral decubitus ulcer extending to the distal sacrum and coccyx where there is increasing erosive change, compatible with osteomyelitis, persistent wall thickening with surrounding inflammation, consistent with proctitis, and marked distention of the bladder with Foley balloon in the urethra concerning for bladder outlet obstruction.  Past Medical History:  Diagnosis Date   Bilateral lower extremity edema: chronic with venous stasis changes 06/02/2014   Bipolar 1 disorder (HCC)    Bipolar disorder (HCC)    CHF (congestive heart failure) (HCC)    Chronic kidney disease    Gout    Gout    Hx of blood clots    Hypertension    Mood swings     OSA on CPAP 06/02/2014   Other and unspecified hyperlipidemia 06/02/2014   Type II or unspecified type diabetes mellitus with unspecified complication, uncontrolled 06/02/2014    Past Surgical History:  Procedure Laterality Date   ABDOMINAL AORTOGRAM W/LOWER EXTREMITY N/A 09/16/2021   Procedure: ABDOMINAL AORTOGRAM W/LOWER EXTREMITY;  Surgeon: Victorino Sparrow, MD;  Location: [facility] INVASIVE CV LAB;  Service: Cardiovascular;  Laterality: N/A;   IR GASTROSTOMY TUBE MOD SED  05/17/2022   LEFT HEART CATH AND CORONARY ANGIOGRAPHY Left 04/01/2020   Procedure: LEFT HEART CATH AND CORONARY ANGIOGRAPHY;  Surgeon: Laurier Nancy, MD;  Location: [facility] INVASIVE CV LAB;  Service: Cardiovascular;  Laterality: Left;   PERIPHERAL VASCULAR BALLOON ANGIOPLASTY Left 09/16/2021   Procedure: PERIPHERAL VASCULAR BALLOON ANGIOPLASTY;  Surgeon: Victorino Sparrow, MD;  Location: [facility] INVASIVE CV LAB;  Service: Cardiovascular;  Laterality: Left;  attempted to PTA posterior tibial artery; unable to cross lesion   TEE WITHOUT CARDIOVERSION N/A 04/21/2022   Procedure: TRANSESOPHAGEAL ECHOCARDIOGRAM (TEE);  Surgeon: [name], MD;  Location: [facility] ENDOSCOPY;  Service: Cardiovascular;  Laterality: N/A;    Family History  Problem Relation Age of Onset   Dementia Mother    Arthritis Father    Hypertension Father    Mental illness Sister    Diabetes Sister    Heart failure Neg Hx    Kidney failure Neg Hx    Cancer Neg Hx     Social History   Tobacco Use  Smoking status: Never   Smokeless tobacco: Never  Vaping Use   Vaping Use: Never used  Substance Use Topics   Alcohol use: No   Drug use: No    Medications: I have reviewed the patient's current medications.  Allergies  Allergen Reactions   Amlodipine Swelling and Other (See Comments)    Legs became swollen   Onglyza [Saxagliptin] Other (See Comments)    "Allergic," per Griffith   Zyprexa [Olanzapine] Other (See Comments)    Allergy noted by  ACT Team. No reaction specified (??)- "Allergic," per [place]   Ritalin [Methylphenidate] Anxiety and Other (See Comments)    "Man[place]"     ROS:  Musculoskeletal:positive for sacral wound  Blood pressure (!) 119/91, pulse 98, temperature 98.5 F (36.9 C), resp. rate 16, height 6' (1.829 m), weight 102.2 kg, SpO2 100 %. Physical Exam Vitals reviewed.  Constitutional:      General: He is not in acute distress.    Appearance: Normal appearance. He is ill-appearing.  HENT:     Head: Normocephalic and atraumatic.  Eyes:     Extraocular Movements: Extraocular movements intact.     Pupils: Pupils are equal, round, and reactive to light.  Cardiovascular:     Rate and Rhythm: Normal rate.  Pulmonary:     Effort: Pulmonary effort is normal.  Abdominal:     General: There is no distension.     Palpations: Abdomen is soft.     Tenderness: There is no abdominal tenderness.     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      Results: Results for orders placed or performed during the hospital encounter of 06/30/22 (from the past 48 hour(s))  Resp Panel by RT-PCR (Flu A&B, Covid) Anterior Nasal Swab     Status: None   Collection Time: 06/30/22  1:17 PM   Specimen: Anterior Nasal Swab  Result Value Ref Range   SARS Coronavirus 2 by RT PCR NEGATIVE NEGATIVE    Comment: (NOTE) SARS-CoV-2 target nucleic acids are NOT DETECTED.  The SARS-CoV-2 RNA is generally detectable in upper respiratory specimens during the acute phase of infection. The lowest concentration of SARS-CoV-2 viral copies this assay can detect is 138 copies/mL. A negative result does not preclude SARS-Cov-2 infection and should not be used as the sole basis for treatment  or other patient management decisions. A negative result may occur with  improper specimen collection/handling, submission of specimen other than nasopharyngeal swab, presence of viral mutation(s) within the areas targeted by this assay, and inadequate number of viral copies(<138 copies/mL). A negative result must be combined with clinical observations, patient history, and epidemiological information. The expected result is Negative.  Fact Sheet for Patients:  EntrepreneurPulse.com.au  Fact Sheet for Healthcare Providers:  IncredibleEmployment.be  This test is no t yet approved or cleared by the [place] FDA and  has been authorized for detection and/or diagnosis of SARS-CoV-2 by FDA under an Emergency Use Authorization (EUA). This EUA will remain  in effect (meaning this test can be used) for the duration of the COVID-19 declaration under Section 564(b)(1) of the Act, 21 U.S.C.section 360bbb-3(b)(1), unless the authorization is terminated  or revoked sooner.  Influenza A by PCR NEGATIVE NEGATIVE   Influenza B by PCR NEGATIVE NEGATIVE    Comment: (NOTE) The Xpert Xpress SARS-CoV-2/FLU/RSV plus assay is intended as an aid in the diagnosis of influenza from Nasopharyngeal swab specimens and should not be used as a sole basis for treatment. Nasal washings and aspirates are unacceptable for Xpert Xpress SARS-CoV-2/FLU/RSV testing.  Fact Sheet for Patients: EntrepreneurPulse.com.au  Fact Sheet for Healthcare Providers: IncredibleEmployment.be  This test is not yet approved or cleared by the [place] FDA and has been authorized for detection and/or diagnosis of SARS-CoV-2 by FDA under an Emergency Use Authorization (EUA). This EUA will remain in effect (meaning this test can be used) for the duration of the COVID-19 declaration under Section 564(b)(1) of the Act, 21 U.S.C. section  360bbb-3(b)(1), unless the authorization is terminated or revoked.  Performed at [facility], [address], [place], [place] [address]   Urinalysis, Routine w reflex microscopic Anterior Nasal Swab     Status: Abnormal   Collection Time: 06/30/22  1:17 PM  Result Value Ref Range   Color, Urine AMBER (A) YELLOW    Comment: BIOCHEMICALS MAY BE AFFECTED BY COLOR   APPearance CLOUDY (A) CLEAR   Specific Gravity, Urine 1.015 1.005 - 1.030   pH 7.0 5.0 - 8.0   Glucose, UA NEGATIVE NEGATIVE mg/dL   Hgb urine dipstick SMALL (A) NEGATIVE   Bilirubin Urine NEGATIVE NEGATIVE   Ketones, ur NEGATIVE NEGATIVE mg/dL   Protein, ur 30 (A) NEGATIVE mg/dL   Nitrite NEGATIVE NEGATIVE   Leukocytes,Ua LARGE (A) NEGATIVE   RBC / HPF 11-20 0 - 5 RBC/hpf   WBC, UA >50 (H) 0 - 5 WBC/hpf   Bacteria, UA FEW (A) NONE SEEN   WBC Clumps PRESENT     Comment: Performed at [facility], [address] [place], [place] [address]  Lactic acid, plasma     Status: Abnormal   Collection Time: 06/30/22  1:49 PM  Result Value Ref Range   Lactic Acid, Venous 2.1 (HH) 0.5 - 1.9 mmol/L    Comment: CRITICAL RESULT CALLED TO, READ BACK BY AND VERIFIED WITH: WILLIAMS C. AT 1424 ON [id number] BY THOMPSON S. Performed at [facility], [address] [place], [place] [address]   Comprehensive metabolic panel     Status: Abnormal   Collection Time: 06/30/22  1:50 PM  Result Value Ref Range   Sodium 133 (L) 135 - 145 mmol/L   Potassium 4.5 3.5 - 5.1 mmol/L   Chloride 97 (L) 98 - 111 mmol/L   CO2 27 22 - 32 mmol/L   Glucose, Bld 168 (H) 70 - 99 mg/dL    Comment: Glucose reference range applies only to samples taken after fasting for at least 8 hours.   BUN 36 (H) 8 - 23 mg/dL   Creatinine, Ser 0.96 0.61 - 1.24 mg/dL   Calcium 9.7 8.9 - 10.3 mg/dL   Total Protein 8.4 (H) 6.5 - 8.1 g/dL   Albumin 2.4 (L) 3.5 - 5.0 g/dL   AST 56 (H) 15 - 41 U/L   ALT 68 (H) 0 - 44 U/L   Alkaline Phosphatase 72 38 - 126 U/L   Total Bilirubin  0.5 0.3 - 1.2 mg/dL   GFR, Estimated >60 >60 mL/min    Comment: (NOTE) Calculated using the CKD-EPI Creatinine Equation (2021)    Anion gap 9 5 - 15    Comment: Performed at [facility], [address] [place], [place] [address]  CBC with Differential     Status: Abnormal   Collection Time: 06/30/22  1:50 PM  Result Value Ref Range   WBC 9.0 4.0 - 10.5 K/uL   RBC 4.58 4.22 - 5.81 MIL/uL   Hemoglobin 12.3 (L) 13.0 - 17.0 g/dL   HCT 39.7 39.0 - 52.0 %   MCV 86.7 80.0 - 100.0 fL   MCH 26.9 26.0 - 34.0 pg   MCHC 31.0 30.0 - 36.0 g/dL   RDW 15.9 (H) 11.5 - 15.5 %   Platelets 483 (H) 150 - 400 K/uL   nRBC 0.0 0.0 - 0.2 %   Neutrophils Relative % 81 %   Neutro Abs 7.2 1.7 - 7.7 K/uL   Lymphocytes Relative 13 %   Lymphs Abs 1.2 0.7 - 4.0 K/uL   Monocytes Relative 5 %   Monocytes Absolute 0.5 0.1 - 1.0 K/uL   Eosinophils Relative 1 %   Eosinophils Absolute 0.1 0.0 - 0.5 K/uL   Basophils Relative 0 %   Basophils Absolute 0.0 0.0 - 0.1 K/uL   Immature Granulocytes 0 %   Abs Immature Granulocytes 0.02 0.00 - 0.07 K/uL    Comment: Performed at [facility], [address] [place], [place] [address]  Protime-INR     Status: None   Collection Time: 06/30/22  1:50 PM  Result Value Ref Range   Prothrombin Time 14.4 11.4 - 15.2 seconds   INR 1.1 0.8 - 1.2    Comment: (NOTE) INR goal varies based on device and disease states. Performed at [facility], [address] [place], [place] [address]   APTT     Status: None   Collection Time: 06/30/22  1:50 PM  Result Value Ref Range   aPTT 29 24 - 36 seconds    Comment: Performed at [facility], [address] [place], [place] [address]  Blood Culture (routine x 2)     Status: None (Preliminary result)   Collection Time: 06/30/22  1:50 PM   Specimen: Left Antecubital; Blood  Result Value Ref Range   Specimen Description LEFT ANTECUBITAL    Special Requests      BOTTLES DRAWN AEROBIC AND ANAEROBIC Blood Culture adequate volume Performed  at [facility], [address], [place], [place] [address]    Culture PENDING    Report Status PENDING   Blood Culture (routine x 2)     Status: None (Preliminary result)   Collection Time: 06/30/22  1:51 PM   Specimen: BLOOD LEFT HAND  Result Value Ref Range   Specimen Description BLOOD LEFT HAND    Special Requests      BOTTLES DRAWN AEROBIC AND ANAEROBIC Blood Culture results may not be optimal due to an inadequate volume of blood received in culture bottles Performed at [facility], [address], [place], [place] [address]    Culture PENDING    Report Status PENDING   Lactic acid, plasma     Status: Abnormal   Collection Time: 06/30/22  4:20 PM  Result Value Ref Range   Lactic Acid, Venous 2.8 (HH) 0.5 - 1.9 mmol/L    Comment: CRITICAL VALUE NOTED.  VALUE IS CONSISTENT WITH PREVIOUSLY REPORTED AND CALLED VALUE. Performed at [facility], [address], [place], [place] [address]   Glucose, capillary     Status: Abnormal   Collection Time: 06/30/22  8:01 PM  Result Value Ref Range   Glucose-Capillary 109 (H) 70 - 99 mg/dL    Comment: Glucose reference range applies only  to samples taken after fasting for at least 8 hours.  MRSA Next Gen by PCR, Nasal     Status: None   Collection Time: 06/30/22  8:22 PM   Specimen: Nasal Mucosa; Nasal Swab  Result Value Ref Range   MRSA by PCR Next Gen NOT DETECTED NOT DETECTED    Comment: (NOTE) The GeneXpert MRSA Assay (FDA approved for NASAL specimens only), is one component of a comprehensive MRSA colonization surveillance program. It is not intended to diagnose MRSA infection nor to guide or monitor treatment for MRSA infections. Test performance is not FDA approved in patients less than 62 years old. Performed at [facility], [address], [place], [place] [address]   Lactic acid, plasma     Status: None   Collection Time: 06/30/22  9:24 PM  Result Value Ref Range   Lactic Acid, Venous 0.8 0.5 - 1.9 mmol/L    Comment:  Performed at [facility], [address] [place], [place] [address]  Glucose, capillary     Status: None   Collection Time: 07/01/22 12:04 AM  Result Value Ref Range   Glucose-Capillary 90 70 - 99 mg/dL    Comment: Glucose reference range applies only to samples taken after fasting for at least 8 hours.  Glucose, capillary     Status: Abnormal   Collection Time: 07/01/22  4:04 AM  Result Value Ref Range   Glucose-Capillary 102 (H) 70 - 99 mg/dL    Comment: Glucose reference range applies only to samples taken after fasting for at least 8 hours.  CBC     Status: Abnormal   Collection Time: 07/01/22  5:13 AM  Result Value Ref Range   WBC 6.6 4.0 - 10.5 K/uL   RBC 3.68 (L) 4.22 - 5.81 MIL/uL   Hemoglobin 9.9 (L) 13.0 - 17.0 g/dL   HCT 31.8 (L) 39.0 - 52.0 %   MCV 86.4 80.0 - 100.0 fL   MCH 26.9 26.0 - 34.0 pg   MCHC 31.1 30.0 - 36.0 g/dL   RDW 15.7 (H) 11.5 - 15.5 %   Platelets 335 150 - 400 K/uL   nRBC 0.0 0.0 - 0.2 %    Comment: Performed at [facility], [address], [place], [place] [id number]  Basic metabolic panel     Status: Abnormal   Collection Time: 07/01/22  5:13 AM  Result Value Ref Range   Sodium 133 (L) 135 - 145 mmol/L   Potassium 4.6 3.5 - 5.1 mmol/L   Chloride 102 98 - 111 mmol/L   CO2 28 22 - 32 mmol/L   Glucose, Bld 105 (H) 70 - 99 mg/dL    Comment: Glucose reference range applies only to samples taken after fasting for at least 8 hours.   BUN 27 (H) 8 - 23 mg/dL   Creatinine, Ser 0.75 0.61 - 1.24 mg/dL   Calcium 9.1 8.9 - 10.3 mg/dL   GFR, Estimated >60 >60 mL/min    Comment: (NOTE) Calculated using the CKD-EPI Creatinine Equation (2021)    Anion gap 3 (L) 5 - 15    Comment: Performed at [facility], [address] [place], [place] [address]  Glucose, capillary     Status: Abnormal   Collection Time: 07/01/22  7:48 AM  Result Value Ref Range   Glucose-Capillary 104 (H) 70 - 99 mg/dL    Comment: Glucose reference range applies only to samples taken  after fasting for at least 8 hours.    CT ABDOMEN PELVIS  W CONTRAST  Result Date: 06/30/2022 CLINICAL DATA:  Sepsis, concern for sacral osteomyelitis EXAM: CT ABDOMEN AND PELVIS WITH CONTRAST TECHNIQUE: Multidetector CT imaging of the abdomen and pelvis was performed using the standard protocol following bolus administration of intravenous contrast. RADIATION DOSE REDUCTION: This exam was performed according to the departmental dose-optimization program which includes automated exposure control, adjustment of the mA and/or kV according to patient size and/or use of iterative reconstruction technique. CONTRAST:  OMNIPAQUE IOHEXOL 300 MG/ML  SOLN COMPARISON:  CT abdomen and pelvis 05/27/2022 FINDINGS: Lower chest: Bibasilar scarring/atelectasis. Trace left pleural effusion. Cardiomegaly. Coronary atherosclerosis and/or stenting. No pericardial effusion. Hepatobiliary: No focal liver abnormality is seen. No gallstones, gallbladder wall thickening, or biliary dilatation. Pancreas: Unremarkable. No pancreatic ductal dilatation or surrounding inflammatory changes. Spleen: Normal in size without focal abnormality. Adrenals/Urinary Tract: Decreased size of the extrarenal pelvis on the left. No hydronephrosis or perinephric stranding. Adrenal glands are unremarkable. Marked distention of the urinary bladder. The Foley balloon appears to be within the urethra. Stomach/Bowel: Stomach is within normal limits. Appendix appears normal. No evidence of bowel wall thickening, distention, or inflammatory changes. Large colonic stool load. Rectal wall thickening. Large stool ball in the rectum. PEG tube. Vascular/Lymphatic: Aortoiliac atherosclerotic calcification. No aneurysm. No suspicious abdominopelvic adenopathy. Reproductive: Prostate is unremarkable. Other: No free intraperitoneal air. Musculoskeletal: Redemonstrated sacral decubitus ulcer overlying the sacrum and coccyx and extending inferiorly along the gluteal  cleft. The area of ulceration measures approximally 4.2 cm in width and 12.1 cm in craniocaudal dimension. The ulcer extends to the inferior portion of the sacrum and coccyx where there is cortical disruption and osseous resorption compatible with sacral osteomyelitis, progressed from 05/27/2022. Adjacent inflammatory fat stranding within the subcutaneous tissues of the back. Additional soft tissue thickening and edema/stranding within the presacral space. No drainable fluid collection. IMPRESSION: 1. Sacral decubitus ulcer. The ulcer extends to distal sacrum and coccyx where there is increasing erosive change since 05/27/2022 compatible osteomyelitis. 2. Persistent rectal wall thickening and surrounding inflammation consistent with proctitis. 3. Marked distention of the bladder with Foley balloon in the urethra concerning for bladder outlet obstruction. 4. Aortic Atherosclerosis (ICD10-I70.0). These results were called by telephone at the time of interpretation on 06/30/2022 at 4:25 pm to provider Dr. Effie Shy, who verbally acknowledged these results. Electronically Signed   By: Minerva Fester M.D.   On: 06/30/2022 16:52   DG Chest Port 1 View  Result Date: 06/30/2022 CLINICAL DATA:  Possible sepsis, worsening sacral wound. EXAM: PORTABLE CHEST 1 VIEW COMPARISON:  05/27/2022 and CT chest 05/27/2022. FINDINGS: Trachea is midline. Heart size is accentuated by AP semi upright technique and low lung volumes. Left basilar collapse/consolidation. Lungs are otherwise clear. IMPRESSION: Left basilar collapse/consolidation may be due to atelectasis. Difficult to exclude pneumonia. Electronically Signed   By: Leanna Battles M.D.   On: 06/30/2022 13:45     Assessment & Plan:  Christian [name] is a 71 y.o. male who was admitted with concerns for sepsis thought to be secondary to stage IV sacral decubitus wound with osteomyelitis.  CT demonstrates progression of osteomyelitis, proctitis, and bladder outlet obstruction as  Foley balloon is within urethra.  No leukocytosis.  Imaging and blood work evaluated by myself  -Stage IV sacral wound with necrotic tissue overlying the base-will require bedside debridement -Hold morning dose of Lovenox, may resume with evening dose -I had a discussion with the patient's daughter, and I explained that given his chronic debility, he is very unlikely to  be able to heal this wound.  We will plan to clean up the wound is much as possible and then perform daily dressing changes with Santyl to help debride any remaining dead tissue -The risk and benefits of bedside sacral wound debridement were discussed including but not limited to bleeding, infection, need for additional procedure, poor wound healing, and injury to surrounding structures.  After careful consideration, [name] (patient's daughter and legal guardian) has decided to proceed with bedside debridement.  -Recommend BID dressing changes with Santyl post-debridement -Will place wound care orders -Offload as much as possible -Patient noted to be bacteremic on blood culture (gram-positive cocci in clusters), defer antibiotics to primary team -Remainder of care per primary team -Please call with any questions or concerns   All questions were answered to the satisfaction of the patient and family.  -- Graciella Freer, DO [facility] [address] [place], [place] [address] [phone] (office)

## 2022-07-01 NOTE — Progress Notes (Signed)
[facility] Progress Note  Patient: Christian [name]    WUJ:[id number]  DOA: 06/30/2022     Date of Service: the patient was seen and examined on 07/01/2022  Chief Complaint  Patient presents with   Wound Infection   Brief hospital course:  Christian [name]  is a 71 y.o. male, with medical history significant for diabetes mellitus, CVA, bipolar disorder, coronary artery disease, PE and DVT, CKD 3, atrial fibrillation, OSA, gastrostomy tube and chronic Foley catheter, patient with multiple recent prolonged hospitalization, most recently with severe sepsis, due to strep constellatus bacteremia and sacral osteomyelitis, currently receiving Augmentin, patient was sent by facility with worsening wound infection, facility states that they are no longer able to provide appropriate medical care for this patient, and thus they transferred him back to the hospital, patient was tachycardic, and borderline febrile of 100 Fahrenheit on admission, he denies any chest pain, shortness of breath, headache. - in ED evaded lactic acid at 2.8, thrombocytosis at 483 K, meeting significant for worsening sacral pressure ulcer with osteomyelitis, he was tachycardic as well, sepsis protocol initiated and [facility] consulted to admit.   Assessment and Plan: Principal Problem:   Sepsis (HCC) Active Problems:   Bipolar affective disorder, current episode manic with psychotic symptoms (HCC)   Sacral decubitus ulcer, stage IV (HCC)   Type 2 diabetes mellitus with complication, without long-term current use of insulin (HCC)   OSA on CPAP   Bilateral lower extremity edema: chronic with venous stasis changes   Gout     Severe sepsis, present on admission -Sepsis present on admission, tachypnea, tachycardia, fever and lethargy with elevated lactic acid -Source of sepsis secondary to infected pressure ulcer, with underlying osteomyelitis -During recent admission patient did grow Streptococcus constellatus, he was  treated with Unasyn, and still receiving Augmentin in the facility. -All cultures were sent, follow on final results. -Septic despite being on Augmentin, will broaden antibiotic coverage to vancomycin and cefepime. -Continue with IV fluids, trend lactic acid,  8/2 urine culture growing GNR 8/2 Blood cultures growing GPC, follow complete report    Sacral pressure ulcer, stage IV, infected with underlining osteomyelitis -General surgery s/p bedside debridement was done on 8/3 -Hold Xarelto for now if he needs after debridement       Malfunction indwelling Foley catheter -Imaging showing balloon inflated in the urethra, Foley catheter was discontinued and new 1 inserted while in ED   Pulmonary embolism -Hold Xarelto, will keep in Lovenox pending evaluation by general surgery possible need for surgical debridement   Type 2 diabetes mellitus -A1c most recently 7.6 on recent admission, will start on lower dose Semglee 30 units instead of 45, and will keep on SSI scale every 4 hours   Hypertension -Pressure is soft,  will hold metoprolol    Body mass index is 30.56 kg/m.  Nutrition Problem: Increased nutrient needs Etiology: wound healing Interventions: Interventions: Tube feeding   Pressure Injury 03/31/22 Sacrum Mid Stage 4 - Full thickness tissue loss with exposed bone, tendon or muscle. (Active)  03/31/22 1933  Location: Sacrum  Location Orientation: Mid  Staging: Stage 4 - Full thickness tissue loss with exposed bone, tendon or muscle.  Wound Description (Comments):   Present on Admission: Yes  Dressing Type ABD;Moist to dry;Santyl;Other (Comment) 07/01/22 1100    Predebridement and postdebridement pictures are in the chart   Procedures: Excisional debridement of sacral decubitus ulcer done on 8/3  Consults General surgery and palliative care  Diet: Pured DVT Prophylaxis:  Subcutaneous Lovenox   Advance goals of care discussion: Full code  Family Communication:  family was present at bedside, at the time of interview.  The pt provided permission to discuss medical plan with the family. Opportunity was given to ask question and all questions were answered satisfactorily.   Disposition:  Pt is from SNF, admitted with decub ulcer sepsis/cellulitis and bacteremia, still has active infection, blood culture and urine culture pending, which precludes a safe discharge. Discharge to SNF, when clinically stable and awaiting for cultures, may need 2 to 3 days more.  Subjective: No significant events overnight, patient was seen after bedside debridement of wound, tolerated procedure well, patient stated that he is feeling better.  Denied any chest pain or palpitation, no shortness of breath, pain was under control.   Physical Exam: General:  alert oriented to time, place, and person.  Appear in no distress, affect appropriate Eyes: PERRLA ENT: Oral Mucosa Clear, dry  Neck: no JVD,  Cardiovascular: S1 and S2 Present, no Murmur,  Respiratory: good respiratory effort, Bilateral Air entry equal and Decreased, no Crackles, no wheezes Abdomen: Bowel Sound present, Soft and no tenderness,  Skin:  Stage IV sacral decubitus ulcer Extremities: mild Pedal edema, no calf tenderness Neurologic: without any new focal findings Gait not checked due to patient safety concerns  Vitals:   07/01/22 0211 07/01/22 0556 07/01/22 0921 07/01/22 1409  BP: 101/72 (!) 119/91  131/83  Pulse: (!) 105 98  (!) 108  Resp: 18 16  (!) 28  Temp: 98.2 F (36.8 C) 98.5 F (36.9 C)  97.7 F (36.5 C)  TempSrc:    Oral  SpO2: 97% 100%  97%  Weight:   102.2 kg   Height:        Intake/Output Summary (Last 24 hours) at 07/01/2022 1654 Last data filed at 07/01/2022 1504 Gross per 24 hour  Intake 3654.24 ml  Output 2225 ml  Net 1429.24 ml   Filed Weights   06/30/22 1311 06/30/22 1959 07/01/22 0921  Weight: 128.3 kg 100.7 kg 102.2 kg    Data Reviewed: I have personally reviewed and  interpreted daily labs, tele strips, imagings as discussed above. I reviewed all nursing notes, pharmacy notes, vitals, pertinent old records I have discussed plan of care as described above with RN and patient/family.  CBC: Recent Labs  Lab 06/30/22 1350 07/01/22 0513  WBC 9.0 6.6  NEUTROABS 7.2  --   HGB 12.3* 9.9*  HCT 39.7 31.8*  MCV 86.7 86.4  PLT 483* 335   Basic Metabolic Panel: Recent Labs  Lab 06/30/22 1350 07/01/22 0513  NA 133* 133*  K 4.5 4.6  CL 97* 102  CO2 27 28  GLUCOSE 168* 105*  BUN 36* 27*  CREATININE 0.96 0.75  CALCIUM 9.7 9.1    Studies: No results found.  Scheduled Meds:  ARIPiprazole  10 mg Oral q morning   atorvastatin  10 mg Oral q1800   Chlorhexidine Gluconate Cloth  6 each Topical Daily   collagenase   Topical BID   enoxaparin (LOVENOX) injection  100 mg Subcutaneous Q12H   feeding supplement (PROSource TF)  45 mL Per Tube BID   free water  150 mL Per Tube Q6H   insulin aspart  0-9 Units Subcutaneous Q4H   insulin glargine-yfgn  30 Units Subcutaneous Daily   liothyronine  10 mcg Oral Daily   metoprolol succinate  50 mg Oral Daily   multivitamin  15 mL Oral Daily   senna-docusate  2 tablet Oral BID   sodium hypochlorite   Irrigation BID   tamsulosin  0.4 mg Oral Daily   Vitamin D (Ergocalciferol)  50,000 Units Oral Q7 days   Continuous Infusions:  ceFEPime (MAXIPIME) IV Stopped (07/01/22 1301)   feeding supplement (GLUCERNA 1.5 CAL) 50 mL/hr at 07/01/22 1504   vancomycin Stopped (07/01/22 1503)   PRN Meds: acetaminophen  Time spent: 35 minutes  Author: Gillis Santa. MD [facility] 07/01/2022 4:54 PM  To reach On-call, see care teams to locate the attending and reach out to them via [email or web address]. If 7PM-7AM, please contact night-coverage If you still have difficulty reaching the attending provider, please page the [facility] (Director on Call) for [facility] on amion for assistance.

## 2022-07-01 NOTE — Progress Notes (Addendum)
Initial Nutrition Assessment  DOCUMENTATION CODES:   Obesity unspecified  INTERVENTION:  Recommend: Glucerna 1.2 @  50 ml/hr via g-tube and increase by 10 ml every 6 hours to goal rate of 75 ml/hr.   Add 45 ml ProSource TF- BID.    Tube feeding regimen provides: 2240 kcal (100% of needs), 130 grams of protein, and 1449 ml of H2O.    Multivitamin 5 ml daily  NUTRITION DIAGNOSIS:   Increased nutrient needs related to wound healing as evidenced by estimated needs.   GOAL:  Patient will meet greater than or equal to 90% of their needs   MONITOR:  Labs, I & O's, Skin, Weight trends, TF tolerance  REASON FOR ASSESSMENT:   Malnutrition Screening Tool    ASSESSMENT: Patient is a 71 yo male with hx of DM2, Gout, CKD, CHF, BLE edema.   Patient presents from Rehab facility with infected chronic sacral wound. Underwent bedside debridement this morning. Family present as assisted with nutrition background.  Patient NPO. He has been on pureed foods at facility and needs assistance with feeding. He has not been eating well and complains he doesn't like the food.   Patient drinks ice water and likes Ensure.   PEG tube placed in June per family. Nocturnal enteral feeding to allow maximum oral intake. Currently he is receiving continuous Glucerna 1.2 @ 50 ml/hr. Provides: 1440 kcal, 72 gr protein and 966 ml water.   Weight history - 03/11/22 -117.9 kg and presently - 102.2 kg. A loss of 13% x 4 months which is significant.   Medications: insulin, Senokot-S, Vitamin D (50,000 units-weekly)     Latest Ref Rng & Units 07/01/2022    5:13 AM 06/30/2022    1:50 PM 06/04/2022    5:13 AM  BMP  Glucose 70 - 99 mg/dL 331  740  992   BUN 8 - 23 mg/dL 27  36  32   Creatinine 0.61 - 1.24 mg/dL 7.80  0.44  7.15   Sodium 135 - 145 mmol/L 133  133  133   Potassium 3.5 - 5.1 mmol/L 4.6  4.5  4.5   Chloride 98 - 111 mmol/L 102  97  102   CO2 22 - 32 mmol/L 28  27  27    Calcium 8.9 - 10.3 mg/dL 9.1   9.7  9.5       NUTRITION - FOCUSED PHYSICAL EXAM: Unable to complete Nutrition-Focused physical exam at this time.     Diet Order:   Diet Order             Diet NPO time specified  Diet effective now                   EDUCATION NEEDS:  Education needs have been addressed  Skin:  Skin Assessment: Skin Integrity Issues: Skin Integrity Issues:: Unstageable Unstageable: chronic sacral wound  Last BM:  unknown  Height:   Ht Readings from Last 1 Encounters:  06/30/22 6' (1.829 m)    Weight:   Wt Readings from Last 1 Encounters:  07/01/22 102.2 kg    Ideal Body Weight:   81 kg  BMI:  Body mass index is 30.56 kg/m.  Estimated Nutritional Needs:   Kcal:  2202-2385  Protein:  122-130 gr  Fluid:  < 2 liters daily  07-28-1992 MS,RD,CSG,LDN Contact: Royann Shivers

## 2022-07-01 NOTE — Consult Note (Signed)
Consultation Note Date: 07/01/2022   Patient Name: Christian [name]  DOB: 09/03/1951  MRN: [id number]  Age / Sex: 71 y.o., male  PCP: Charlynne Pander, MD Referring Physician: Gillis Santa, MD  Reason for Consultation: Establishing goals of care  HPI/Patient Profile: 71 y.o. male  with past medical history of DM, CVA, bipolar disorder, CAD, PE/DVT, CKD 3, A-fib, OSA, G-tube and chronic Foley catheter, multiple recent prolonged hospitalizations most recently for severe sepsis due to bacteremia and sacral osteomyelitis admitted on 06/30/2022 with severe sepsis present on admission secondary to infected pressure ulcer and underlying osteomyelitis.   Clinical Assessment and Goals of Care: I have reviewed medical records including EPIC notes, labs and imaging, received report from RN, assessed the patient. Christian [name] is lying quietly in bed.  He appears acutely/chronically ill and frail.  He is alert, and I believe that he can make his basic needs known.  There is no family present at bedside at this time. Conference with bedside nursing staff who states that surgeon is coming soon for wound debridement.  While on the floor, it is noted that Christian [name]'s legal guardian, his daughter, [name], and his wife are present.  We meet at bedside to discuss diagnosis prognosis, GOC, EOL wishes, disposition and options.   I introduced Palliative Medicine as specialized medical care for people living with serious illness. It focuses on providing relief from the symptoms and stress of a serious illness. The goal is to improve quality of life for both the patient and the family.  [name] has been seen by the inpatient palliative team several times over the last few months.  We then focused on their current illness.  We talk in detail about Christian [name] wounds which have worsened.  We talked about the plan for bedside debridement  this morning, for which family will be present.   We talk about infection and the treatment plan, wound and the treatment plan, osteomyelitis, and bacteremia including the treatment plan, reoccurrence, and increased mortality.  The natural disease trajectory and expectations at EOL were discussed.  Advanced directives, concepts specific to code status, artifical feeding and hydration, and rehospitalization were considered and discussed.  We talked about the concept of "treat the treatable, but allowing natural passing".  I share that Christian [name] and her mother have the right to say what this time looks like and feels like for Christian [name].  At this point, Christian [name] remains full scope/full code.  Hospice and Palliative Care services outpatient were explained and offered.  [name] was to be active with [facility] outpatient palliative services.  It is unclear if this services started.  We talk about hospice care, both in the facility and at a residential hospice building.  We talk in detail about what is and is not provided.  I shared that people go to residential hospice to "let nature take its course.  We talk about prognosis related to hospice care and in general.  Christian [name] asks appropriate questions about hospice.  Discussed  the importance of continued conversation with family and the medical providers regarding overall plan of care and treatment options, ensuring decisions are within the context of the patient's values and GOCs.  Questions and concerns were addressed.  The family was encouraged to call with questions or concerns.  PMT will continue to support holistically.  Conference with attending, surgeon, bedside nursing staff, transition of care team related to patient condition, needs, goals of care.   HCPOA LEGAL GUARDIAN - LEGAL GUARDIAN -daughter, [name].  Copy of legal guardian paperwork can be found under chart review/media dated 01/30/2019.     SUMMARY OF RECOMMENDATIONS   At  this point continue full scope/full code Time for outcomes At this point would return to [facility] under long-term care   Code Status/Advance Care Planning: Full code -we talked about the concept of "treat the treatable, but allowing natural passing".  At this point, Christian [name] remains full scope/full code.  Symptom Management:  Per hospitalist/surgery, no additional needs at this time.  Palliative Prophylaxis:  Frequent Pain Assessment, Oral Care, and Turn Reposition  Additional Recommendations (Limitations, Scope, Preferences): Full Scope Treatment  Psycho-social/Spiritual:  Desire for further Chaplaincy support:no Additional Recommendations: Caregiving  Support/Resources and Education on Hospice  Prognosis:  < 3 months, or less would not be surprising even with the most aggressive care.  Weeks would be anticipated if family elected comfort care/hospice.  Prognosis discussed with family with permission.  Discharge Planning:  To be determined, based on outcomes and family choice.       Primary Diagnoses: Present on Admission:  Sepsis (HCC)  Type 2 diabetes mellitus with complication, without long-term current use of insulin (HCC)  Bilateral lower extremity edema: chronic with venous stasis changes  Gout  Bipolar affective disorder, current episode manic with psychotic symptoms (HCC)  Sacral decubitus ulcer, stage IV (HCC)   I have reviewed the medical record, interviewed the patient and family, and examined the patient. The following aspects are pertinent.  Past Medical History:  Diagnosis Date   Bilateral lower extremity edema: chronic with venous stasis changes 06/02/2014   Bipolar 1 disorder (HCC)    Bipolar disorder (HCC)    CHF (congestive heart failure) (HCC)    Chronic kidney disease    Gout    Gout    Hx of blood clots    Hypertension    Mood swings    OSA on CPAP 06/02/2014   Other and unspecified hyperlipidemia 06/02/2014   Type II or  unspecified type diabetes mellitus with unspecified complication, uncontrolled 06/02/2014   Social History   Socioeconomic History   Marital status: Divorced    Spouse name: Not on file   Number of children: Not on file   Years of education: Not on file   Highest education level: Not on file  Occupational History   Not on file  Tobacco Use   Smoking status: Never   Smokeless tobacco: Never  Vaping Use   Vaping Use: Never used  Substance and Sexual Activity   Alcohol use: No   Drug use: No   Sexual activity: Not Currently  Other Topics Concern   Not on file  Social History Narrative   *The patient is divorced and is currently on disability. He lives alone and has one daughter.             Social Determinants of Health   Financial Resource Strain: Not on file  Food Insecurity: Not on file  Transportation Needs:  Not on file  Physical Activity: Not on file  Stress: Not on file  Social Connections: Not on file   Family History  Problem Relation Age of Onset   Dementia Mother    Arthritis Father    Hypertension Father    Mental illness Sister    Diabetes Sister    Heart failure Neg Hx    Kidney failure Neg Hx    Cancer Neg Hx    Scheduled Meds:  ARIPiprazole  10 mg Oral q morning   atorvastatin  10 mg Oral q1800   Chlorhexidine Gluconate Cloth  6 each Topical Daily   collagenase   Topical BID   enoxaparin (LOVENOX) injection  100 mg Subcutaneous Q12H   feeding supplement (PROSource TF)  45 mL Per Tube BID   free water  150 mL Per Tube Q6H   insulin aspart  0-9 Units Subcutaneous Q4H   insulin glargine-yfgn  30 Units Subcutaneous Daily   liothyronine  10 mcg Oral Daily   metoprolol succinate  50 mg Oral Daily   multivitamin  15 mL Oral Daily   senna-docusate  2 tablet Oral BID   sodium hypochlorite   Irrigation BID   tamsulosin  0.4 mg Oral Daily   Vitamin D (Ergocalciferol)  50,000 Units Oral Q7 days   Continuous Infusions:  ceFEPime (MAXIPIME) IV 2 g  (07/01/22 1218)   feeding supplement (GLUCERNA 1.5 CAL) 50 mL/hr at 07/01/22 0746   vancomycin 1,250 mg (07/01/22 0110)   PRN Meds:.acetaminophen Medications Prior to Admission:  Prior to Admission medications   Medication Sig Start Date End Date Taking? Authorizing Provider  acetaminophen (TYLENOL) 500 MG tablet Take 2 tablets (1,000 mg total) by mouth every 6 (six) hours as needed for mild pain, fever or headache (pain.). 06/03/22  Yes Johnson, Clanford L, MD  amoxicillin-clavulanate (AUGMENTIN) 875-125 MG tablet Take 1 tablet by mouth every 12 (twelve) hours for 27 days. 06/11/22 07/08/22 Yes Johnson, Clanford L, MD  ARIPiprazole (ABILIFY) 10 MG tablet Take 1 tablet (10 mg total) by mouth every morning. 04/29/22  Yes Lanae Boast, MD  atorvastatin (LIPITOR) 10 MG tablet Take 1 tablet (10 mg total) by mouth daily at 6 PM. Patient taking differently: Take 10 mg by mouth every evening. 02/05/20  Yes Starkes-Perry, Juel Burrow, FNP  benztropine (COGENTIN) 1 MG tablet Take 1 tablet (1 mg total) by mouth 2 (two) times daily. 04/29/22  Yes Lanae Boast, MD  bisacodyl (DULCOLAX) 5 MG EC tablet Take 2 tablets (10 mg total) by mouth daily as needed for moderate constipation. 04/29/22  Yes Kc, Dayna Barker, MD  insulin aspart (NOVOLOG) 100 UNIT/ML FlexPen Before each meal 3 times a day, 140-199 - 2 units, 200-250 - 4 units, 251-299 - 6 units,  300-349 - 8 units,  350 or above 10 units. Insulin PEN if approved, provide syringes and needles if needed. 05/20/22  Yes Leroy Sea, MD  insulin glargine-yfgn (SEMGLEE) 100 UNIT/ML injection Inject 0.45 mLs (45 Units total) into the skin daily. 06/03/22  Yes Johnson, Clanford L, MD  liothyronine (CYTOMEL) 5 MCG tablet Take 10 mcg by mouth daily.   Yes [provider]  melatonin 10 MG TABS Take 10 mg by mouth at bedtime as needed (sleep). 04/29/22  Yes Lanae Boast, MD  metoprolol succinate (TOPROL-XL) 50 MG 24 hr tablet Take 1 tablet (50 mg total) by mouth daily. Take with or  immediately following a meal. Patient taking differently: Take 50 mg by mouth daily. 01/28/22 03/13/23  Yes Marinda Elk, MD  polyethylene glycol powder (GLYCOLAX/MIRALAX) 17 GM/SCOOP powder Take 17 g by mouth daily as needed for mild constipation. 06/03/22  Yes Johnson, Clanford L, MD  rivaroxaban (XARELTO) 20 MG TABS tablet Take 1 tablet (20 mg total) by mouth daily with supper. Patient taking differently: Take 20 mg by mouth daily. 04/02/20  Yes Dhungel, Nishant, MD  senna-docusate (SENOKOT-S) 8.6-50 MG tablet Take 2 tablets by mouth 2 (two) times daily. 04/29/22  Yes Lanae Boast, MD  tamsulosin (FLOMAX) 0.4 MG CAPS capsule Take 1 capsule (0.4 mg total) by mouth daily. 05/21/22  Yes Leroy Sea, MD  Vitamin D, Ergocalciferol, (DRISDOL) 1.25 MG (50000 UNIT) CAPS capsule Take 50,000 Units by mouth every 7 (seven) days.   Yes [provider]  insulin aspart (NOVOLOG) 100 UNIT/ML injection Inject 0-20 Units into the skin every 6 (six) hours. Patient not taking: Reported on 06/30/2022 06/03/22   Cleora Fleet, MD  Nutritional Supplements (FEEDING SUPPLEMENT, GLUCERNA 1.5 CAL,) LIQD Place 1,000 mLs into feeding tube continuous. 1,000 mL, Per Tube, at 65 mL/hr, Continuous 06/03/22   Johnson, Clanford L, MD  Nutritional Supplements (FEEDING SUPPLEMENT, PROSOURCE TF,) liquid Place 45 mLs into feeding tube 3 (three) times daily. 05/20/22   Leroy Sea, MD  Water For Irrigation, Sterile (FREE WATER) SOLN Place 150 mLs into feeding tube every 6 (six) hours. 05/20/22   Leroy Sea, MD   Allergies  Allergen Reactions   Amlodipine Swelling and Other (See Comments)    Legs became swollen   Onglyza [Saxagliptin] Other (See Comments)    "Allergic," per [facility]   Zyprexa [Olanzapine] Other (See Comments)    Allergy noted by ACT Team. No reaction specified (??)- "Allergic," per [facility]   Ritalin [Methylphenidate] Anxiety and Other (See Comments)    "Maniac"    Review of Systems  Unable to perform ROS: Other    Physical Exam Vitals and nursing note reviewed.  Constitutional:      General: He is not in acute distress.    Appearance: He is ill-appearing.  HENT:     Mouth/Throat:     Mouth: Mucous membranes are moist.  Cardiovascular:     Rate and Rhythm: Normal rate.  Skin:    General: Skin is warm and dry.  Neurological:     General: No focal deficit present.     Mental Status: He is alert.  Psychiatric:        Mood and Affect: Mood normal.        Behavior: Behavior normal.     Comments: Calm and cooperative, not fearful     Vital Signs: BP (!) 119/91 (BP Location: Left Arm)   Pulse 98   Temp 98.5 F (36.9 C)   Resp 16   Ht 6' (1.829 m)   Wt 102.2 kg   SpO2 100%   BMI 30.56 kg/m  Pain Scale: 0-10   Pain Score: 6    SpO2: SpO2: 100 % O2 Device:SpO2: 100 % O2 Flow Rate: .   IO: Intake/output summary:  Intake/Output Summary (Last 24 hours) at 07/01/2022 1331 Last data filed at 07/01/2022 1130 Gross per 24 hour  Intake 3054.31 ml  Output 2225 ml  Net 829.31 ml    LBM:   Baseline Weight: Weight: 128.3 kg Most recent weight: Weight: 102.2 kg     Palliative Assessment/Data:   Flowsheet Rows    Flowsheet Row Most Recent Value  Intake Tab  Referral Department Hospitalist  Unit at Time of Referral Med/Surg Unit  Palliative Care Primary Diagnosis Sepsis/Infectious Disease  Date Notified 06/30/22  Palliative Care Type Return patient Palliative Care  Reason for referral Clarify Goals of Care  Date of Admission 06/30/22  Date first seen by Palliative Care 07/01/22  # of days Palliative referral response time 1 Day(s)  # of days IP prior to Palliative referral 0  Clinical Assessment   Palliative Performance Scale Score 20%  Pain Max last 24 hours Not able to report  Pain Min Last 24 hours Not able to report  Dyspnea Max Last 24 Hours Not able to report  Dyspnea Min Last 24 hours Not able to report   Psychosocial & Spiritual Assessment   Palliative Care Outcomes        Time In: 1000 Time Out: 1115 Time Total: 75 minutes  Greater than 50%  of this time was spent counseling and coordinating care related to the above assessment and plan.  Signed by: Katheran Awe, NP   Please contact Palliative Medicine Team phone at [phone] for questions and concerns.  For individual provider: See Loretha Stapler

## 2022-07-01 NOTE — TOC Initial Note (Addendum)
Transition of Care [facility]) - Initial/Assessment Note    Patient Details  Name: Christian [name] MRN: [id number] Date of Birth: 11-Jan-1951  Transition of Care [facility]) CM/SW Contact:    Annice Needy, LCSW Phone Number: 07/01/2022, 7:18 AM  Clinical Narrative:                 Patient from [facility], admitted  for sepsis. Patient recently d/c on 7/5. Patient will need insurance auth to return to facility. Revonda Standard, with the facility advised that patient could return at discharge. Discussed that he had been seen by facilities wound care physician who advised hospitalization as wound had worsened over the past week. Patient will need authorization to return to the facility.  Spoke with daughter, [name], who is also legal guardian and she wants patient to return to facility at dc.  Expected Discharge Plan: Skilled Nursing Facility Barriers to Discharge: Continued Medical Work up   Patient Goals and CMS Choice        Expected Discharge Plan and Services Expected Discharge Plan: Skilled Nursing Facility       Living arrangements for the past 2 months: Skilled Nursing Facility                                      Prior Living Arrangements/Services Living arrangements for the past 2 months: Skilled Nursing Facility Lives with:: Facility Resident Patient language and need for interpreter reviewed:: Yes        Need for Family Participation in Patient Care: Yes (Comment) Care giver support system in place?: Yes (comment)   Criminal Activity/Legal Involvement Pertinent to Current Situation/Hospitalization: No - Comment as needed  Activities of Daily Living      Permission Sought/Granted                  Emotional Assessment         Alcohol / Substance Use: Not Applicable Psych Involvement: No (comment)  Admission diagnosis:  Sepsis (HCC) [A41.9] Patient Active Problem List   Diagnosis Date Noted   Sepsis (HCC) 06/30/2022    Osteomyelitis (HCC)    Severe sepsis (HCC) 05/27/2022   Sacral decubitus ulcer, stage IV (HCC) 05/27/2022   Hypoglycemia 03/12/2022   Cellulitis in diabetic foot (HCC) 01/26/2022   Diabetic ulcer of ankle (HCC) 01/26/2022   Severe sepsis without septic shock (HCC) 04/02/2021   Lactic acidosis 04/02/2021   Hyponatremia 04/02/2021   Hyperkalemia 04/02/2021   Chronic kidney disease, stage 3b (HCC) 04/02/2021   Fever 04/02/2021   Septic shock (HCC) 04/02/2021   Altered mental status    AKI (acute kidney injury) (HCC)    CVA (cerebral vascular accident) (HCC) 10/07/2020   Type 2 diabetes mellitus with stage 3 chronic kidney disease (HCC) 04/01/2020   CAD (coronary artery disease), native coronary artery 04/01/2020   NSTEMI (non-ST elevated myocardial infarction) (HCC) 03/31/2020   Obesity, Class III, BMI 40-49.9 (morbid obesity) (HCC) 03/31/2020   Aspiration pneumonia of both lower lobes due to gastric secretions (HCC) 03/21/2020   Sacral decubitus ulcer, stage II (HCC) 03/19/2020   Chronic diastolic CHF (congestive heart failure) (HCC) 03/17/2020   Atrial flutter, paroxysmal (HCC) 12/27/2019   Constipation 09/16/2018   Bipolar 1 disorder, depressed (HCC) 08/22/2018   Encephalopathy    Paroxysmal atrial fibrillation (HCC) 06/18/2018   History of pulmonary embolism 06/17/2018   Generalized weakness 03/27/2018   Diabetes mellitus type  2 in nonobese (HCC) 03/27/2018   Pulmonary embolism (HCC) 03/27/2018   Fall    Laceration of eyebrow    Bipolar I disorder, most recent episode depressed, severe w psychosis (HCC) 12/22/2015   Bipolar affective disorder, depressed, severe, with psychotic behavior (HCC)    Bipolar I disorder, current or most recent episode depressed, with psychotic features (HCC)    Severe bipolar I disorder with depression (HCC)    Bipolar I disorder, most recent episode depressed, severe with psychotic features (HCC)    Overdose 09/11/2015   Acute respiratory failure  with hypoxia (HCC) 09/05/2015   Elevated troponin 09/05/2015   Somnolence 09/05/2015   Bipolar depression (HCC) 09/05/2015   Acute bilateral deep vein thrombosis (DVT) of femoral veins (HCC) 09/05/2015   Bilateral pulmonary embolism (HCC) 09/02/2015   Labile hypertension    Pulmonary embolism with acute cor pulmonale (HCC)    Dyspnea    Orthostatic hypotension    Bipolar disorder, in partial remission, most recent episode depressed (HCC)    Syncope, near 08/31/2015   Bipolar and related disorder (HCC)    Bipolar affective disorder, depressed, mild (HCC) 08/28/2015   Pressure ulcer 07/30/2015   Protein-calorie malnutrition, severe (HCC) 07/30/2015   Dehydration 07/29/2015   Diabetes (HCC) 07/09/2015   Urinary tract infection associated with catheterization of urinary tract (HCC) 06/19/2014   Bacteremia 06/19/2014   Bipolar affective disorder, current episode manic with psychotic symptoms (HCC) 06/17/2014   Type 2 diabetes mellitus with complication, without long-term current use of insulin (HCC) 06/02/2014   Other and unspecified hyperlipidemia 06/02/2014   OSA on CPAP 06/02/2014   Bilateral lower extremity edema: chronic with venous stasis changes 06/02/2014   Gout 06/02/2014   Hypertension    PCP:  Charlynne Pander, MD Pharmacy:   [facility] - [place], [place] - [address] [place] [address] Phone: [phone] Fax: [phone]     Social Determinants of Health (SDOH) Interventions    Readmission Risk Interventions    05/28/2022   12:57 PM 05/14/2022   12:39 PM 04/13/2022    3:06 PM  Readmission Risk Prevention Plan  Transportation Screening Complete Complete   PCP or Specialist Appt within 3-5 Days Complete  Complete  HRI or Home Care Consult Complete  Complete  Social Work Consult for Recovery Care Planning/Counseling   Complete  Palliative Care Screening Not Complete  Not Applicable  Medication Review Furniture conservator/restorer) Complete Complete Referral to Pharmacy  PCP or Specialist appointment within 3-5 days of discharge  Complete   HRI or Home Care Consult  Complete   Palliative Care Screening  Not Applicable   Skilled Nursing Facility  Complete

## 2022-07-01 NOTE — Progress Notes (Signed)
Date and time results received: 07/01/22 1818 (use smartphrase ".now" to insert current time)  Test: blood cultures  Critical Value: staph epidermis , mec a-c gene detected  Name of Provider Notified: Dr. Lucianne Muss   Orders Received? Or Actions Taken?:

## 2022-07-01 NOTE — Procedures (Signed)
Bedside procedure note   Preoperative Diagnosis: Stage IV sacral decubitus ulcer Postoperative Diagnosis: Stage IV sacral decubitus ulcer   Procedure(s) Performed: Excisional debridement of sacral decubitus ulcer   Performing provider: Theophilus Kinds, DO   Estimated Blood Loss: Minimal   Indication: Patient is a 71 year old male who was admitted to the hospital with worsening sacral wound and concern for infection.  He has required previous debridement of his sacral wound in the past, and has been diagnosed with osteomyelitis on exam and CT scan.  Verbal consent was obtained from the patient's daughter, Christian [name] (legal guardian).  Findings: Stage IV sacral decubitus ulcer, no evidence of subcutaneous infection on debridement   Procedure: At bedside, the sacral wound was prepped with Betadine and draped with OR towels.  Verbal consent obtained from the patient's daughter, Christian [name], prior to beginning of procedure.  Timeout was performed.  Using scalpel and scissors, necrotic tissue was removed from the sacral decubitus ulcer to more healthy, bleeding tissue.  There was an area of fibrinous exudate at the base of the wound that was unable to be adequately debrided, however we will plan to debride this with Santyl.  Santyl was applied to the wound, and it was packed with saline dampened Kerlix, covered with ABD and Medipore tape.  The final measurements of the wound were 7 cm x 13 cm x 4 cm.  Final inspection revealed acceptable hemostasis.  Patient tolerated the procedure without issue.   Predebridement:   Postdebridement:    Theophilus Kinds, DO [facility] [address] [place] [place], [place] [address] [phone] (office)

## 2022-07-01 NOTE — Progress Notes (Signed)
   07/01/22 1409  Assess: MEWS Score  Temp 97.7 F (36.5 C)  BP 131/83  MAP (mmHg) 98  Pulse Rate (!) 108  Resp (!) 28  SpO2 97 %  O2 Device Room Air  Assess: MEWS Score  MEWS Temp 0  MEWS Systolic 0  MEWS Pulse 1  MEWS RR 2  MEWS LOC 0  MEWS Score 3  MEWS Score Color Yellow  Assess: if the MEWS score is Yellow or Red  Were vital signs taken at a resting state? Yes  Focused Assessment No change from prior assessment  Does the patient meet 2 or more of the SIRS criteria? No  MEWS guidelines implemented *See Row Information* Yes  Notify: Provider  Provider Name/Title MD Lucianne Muss  Date Provider Notified 07/01/22  Time Provider Notified 1415  Assess: SIRS CRITERIA  SIRS Temperature  0  SIRS Pulse 1  SIRS Respirations  1  SIRS WBC 1  SIRS Score Sum  3

## 2022-07-01 NOTE — Consult Note (Signed)
WOC Nurse Consult Note: Reason for Consult:sacral wound Patient known to WOC nursing team from previous admissions. DC 7/6 to SNF, readmitted 06/30/22 due to facility limited on care they can provide for worsening sacral wound Wound type: Unstageable Pressure Injury Pressure Injury POA: Yes Measurement: see nursing flow sheets Wound bed:80% black slough/soft eschar 20% pink that can be visualized; wound is deep  Drainage (amount, consistency, odor) heavy, purulent per notes with odor, not surprising due to amount of necrotic tissue Periwound: intact  Dressing procedure/placement/frequency: Will add 1/4% Dakins solution packing for now, pending surgical evaluation, needs debridement. Dakin's will serve as non selective chemical debridement agent.  Added low air loss mattress for moisture management and pressure redistribution.   Re consult if needed, will not follow at this time. Thanks  Mallisa Alameda [employer], RN,CWOCN, CNS, CWON-AP [phone])

## 2022-07-02 DIAGNOSIS — A419 Sepsis, unspecified organism: Secondary | ICD-10-CM | POA: Diagnosis not present

## 2022-07-02 DIAGNOSIS — M8618 Other acute osteomyelitis, other site: Secondary | ICD-10-CM | POA: Diagnosis not present

## 2022-07-02 DIAGNOSIS — L89154 Pressure ulcer of sacral region, stage 4: Secondary | ICD-10-CM | POA: Diagnosis not present

## 2022-07-02 LAB — GLUCOSE, CAPILLARY
Glucose-Capillary: 101 mg/dL — ABNORMAL HIGH (ref 70–99)
Glucose-Capillary: 110 mg/dL — ABNORMAL HIGH (ref 70–99)
Glucose-Capillary: 118 mg/dL — ABNORMAL HIGH (ref 70–99)
Glucose-Capillary: 135 mg/dL — ABNORMAL HIGH (ref 70–99)
Glucose-Capillary: 139 mg/dL — ABNORMAL HIGH (ref 70–99)
Glucose-Capillary: 94 mg/dL (ref 70–99)

## 2022-07-02 LAB — HEPATIC FUNCTION PANEL
ALT: 36 U/L (ref 0–44)
AST: 24 U/L (ref 15–41)
Albumin: 1.9 g/dL — ABNORMAL LOW (ref 3.5–5.0)
Alkaline Phosphatase: 61 U/L (ref 38–126)
Bilirubin, Direct: 0.1 mg/dL (ref 0.0–0.2)
Indirect Bilirubin: 0.6 mg/dL (ref 0.3–0.9)
Total Bilirubin: 0.7 mg/dL (ref 0.3–1.2)
Total Protein: 7 g/dL (ref 6.5–8.1)

## 2022-07-02 LAB — CBC
HCT: 33.7 % — ABNORMAL LOW (ref 39.0–52.0)
Hemoglobin: 10.5 g/dL — ABNORMAL LOW (ref 13.0–17.0)
MCH: 26.6 pg (ref 26.0–34.0)
MCHC: 31.2 g/dL (ref 30.0–36.0)
MCV: 85.3 fL (ref 80.0–100.0)
Platelets: 385 10*3/uL (ref 150–400)
RBC: 3.95 MIL/uL — ABNORMAL LOW (ref 4.22–5.81)
RDW: 15.6 % — ABNORMAL HIGH (ref 11.5–15.5)
WBC: 5.7 10*3/uL (ref 4.0–10.5)
nRBC: 0 % (ref 0.0–0.2)

## 2022-07-02 LAB — BASIC METABOLIC PANEL
Anion gap: 5 (ref 5–15)
BUN: 23 mg/dL (ref 8–23)
CO2: 26 mmol/L (ref 22–32)
Calcium: 9.2 mg/dL (ref 8.9–10.3)
Chloride: 102 mmol/L (ref 98–111)
Creatinine, Ser: 0.76 mg/dL (ref 0.61–1.24)
GFR, Estimated: >60 mL/min (ref >60)
Glucose, Bld: 101 mg/dL — ABNORMAL HIGH (ref 70–99)
Potassium: 4.2 mmol/L (ref 3.5–5.1)
Sodium: 133 mmol/L — ABNORMAL LOW (ref 135–145)

## 2022-07-02 LAB — PHOSPHORUS: Phosphorus: 3 mg/dL (ref 2.5–4.6)

## 2022-07-02 LAB — MAGNESIUM: Magnesium: 1.7 mg/dL (ref 1.7–2.4)

## 2022-07-02 MED ORDER — RIVAROXABAN 20 MG PO TABS
20.0000 mg | ORAL_TABLET | Freq: Every day | ORAL | Status: DC
  Administered 2022-07-03 – 2022-07-07 (×5): 20 mg via ORAL
  Filled 2022-07-02 (×5): qty 1

## 2022-07-02 MED ORDER — ENOXAPARIN SODIUM 100 MG/ML IJ SOSY
100.0000 mg | PREFILLED_SYRINGE | Freq: Two times a day (BID) | INTRAMUSCULAR | Status: AC
  Administered 2022-07-02: 100 mg via SUBCUTANEOUS
  Filled 2022-07-02: qty 1

## 2022-07-02 MED ORDER — PIPERACILLIN-TAZOBACTAM 3.375 G IVPB
3.3750 g | Freq: Three times a day (TID) | INTRAVENOUS | Status: DC
  Administered 2022-07-02 – 2022-07-07 (×16): 3.375 g via INTRAVENOUS
  Filled 2022-07-02 (×16): qty 50

## 2022-07-02 NOTE — Consult Note (Signed)
    [facility]    Date of Admission:  06/30/2022     Reason for Consult: Sepsis     Referring Physician: Dr Lucianne Muss  Current antibiotics: Vancomycin Cefepime  ASSESSMENT:    71 y.o. male admitted with:  Infected sacral decubitus ulcer and osteomyelitis: Patient admitted in June/July for this issue s/p cultures growing Proteus mirabilis and VRE and biopsy with acute OM.  Treated with Unasyn for 2 weeks followed by Augmentin for 4 more weeks through 07/08/22.  Presenting back to [facility] while still on Augmentin from his facility with worsened wound infection and the facility unable to provide adequate care.  Seen by surgery and s/p excisional debridement of sacral ulcer 8/3.  Currently on cefepime and vancomycin. Positive urine culture with GNR: Taken from prior Foley catheter with new catheter inserted in the ED.  Likely not significant as culture is from prior Foley and no other evidence of UTI and source of sepsis was his wound. Staph epi positive blood culture: Positive in 1 of 2 sets and consistent with contaminant.  RECOMMENDATIONS:    Stop vanc and cefepime Place on Zosyn Would give 7 more days of therapy from date of debridement as providing another prolonged antibiotic course will not yield benefit nor be curative without addressing wound care, offloading, diversion, coverage If he discharges prior to 07/08/22 then can place back on Augmentin     Vedia Coffer for Infectious Disease [facility] [phone] pager 07/02/2022, 10:14 AM  I spent > 5 min reviewing chart, formulating plan, and discussing with [facility].

## 2022-07-02 NOTE — Progress Notes (Signed)
PHARMACY - PHYSICIAN COMMUNICATION CRITICAL VALUE ALERT - BLOOD CULTURE IDENTIFICATION (BCID)  Christian [name] is an 71 y.o. male who presented to [facility] on 06/30/2022 with a chief complaint of sacral decubitis ulcer   Assessment:  Dr Earlene Plater following, 1/4 bottles staph hominis, 2/2 bottles in one set staph epi (include suspected source if known)  Name of physician (or Provider) Contacted: Dr Earlene Plater  Current antibiotics: Zsoyn  Changes to prescribed antibiotics recommended:  Dr Earlene Plater ID physicain following   Results for orders placed or performed during the hospital encounter of 06/30/22  Blood Culture ID Panel (Reflexed) (Collected: 06/30/2022  1:51 PM)  Result Value Ref Range   Enterococcus faecalis NOT DETECTED NOT DETECTED   Enterococcus Faecium NOT DETECTED NOT DETECTED   Listeria monocytogenes NOT DETECTED NOT DETECTED   Staphylococcus species DETECTED (A) NOT DETECTED   Staphylococcus aureus (BCID) NOT DETECTED NOT DETECTED   Staphylococcus epidermidis DETECTED (A) NOT DETECTED   Staphylococcus lugdunensis NOT DETECTED NOT DETECTED   Streptococcus species NOT DETECTED NOT DETECTED   Streptococcus agalactiae NOT DETECTED NOT DETECTED   Streptococcus pneumoniae NOT DETECTED NOT DETECTED   Streptococcus pyogenes NOT DETECTED NOT DETECTED   A.calcoaceticus-baumannii NOT DETECTED NOT DETECTED   Bacteroides fragilis NOT DETECTED NOT DETECTED   Enterobacterales NOT DETECTED NOT DETECTED   Enterobacter cloacae complex NOT DETECTED NOT DETECTED   Escherichia coli NOT DETECTED NOT DETECTED   Klebsiella aerogenes NOT DETECTED NOT DETECTED   Klebsiella oxytoca NOT DETECTED NOT DETECTED   Klebsiella pneumoniae NOT DETECTED NOT DETECTED   Proteus species NOT DETECTED NOT DETECTED   Salmonella species NOT DETECTED NOT DETECTED   Serratia marcescens NOT DETECTED NOT DETECTED   Haemophilus influenzae NOT DETECTED NOT DETECTED   Neisseria meningitidis NOT DETECTED NOT DETECTED    Pseudomonas aeruginosa NOT DETECTED NOT DETECTED   Stenotrophomonas maltophilia NOT DETECTED NOT DETECTED   Candida albicans NOT DETECTED NOT DETECTED   Candida auris NOT DETECTED NOT DETECTED   Candida glabrata NOT DETECTED NOT DETECTED   Candida krusei NOT DETECTED NOT DETECTED   Candida parapsilosis NOT DETECTED NOT DETECTED   Candida tropicalis NOT DETECTED NOT DETECTED   Cryptococcus neoformans/gattii NOT DETECTED NOT DETECTED   Methicillin resistance mecA/C DETECTED (A) NOT DETECTED    Christian [name] 07/02/2022  12:06 PM

## 2022-07-02 NOTE — Progress Notes (Signed)
[facility] Progress Note  Patient: Christian [name]    JHE:[id number]  DOA: 06/30/2022     Date of Service: the patient was seen and examined on 07/02/2022  Chief Complaint  Patient presents with   Wound Infection   Brief hospital course:  Christian [name]  is a 71 y.o. male, with medical history significant for diabetes mellitus, CVA, bipolar disorder, coronary artery disease, PE and DVT, CKD 3, atrial fibrillation, OSA, gastrostomy tube and chronic Foley catheter, patient with multiple recent prolonged hospitalization, most recently with severe sepsis, due to strep constellatus bacteremia and sacral osteomyelitis, currently receiving Augmentin, patient was sent by facility with worsening wound infection, facility states that they are no longer able to provide appropriate medical care for this patient, and thus they transferred him back to the hospital, patient was tachycardic, and borderline febrile of 100 Fahrenheit on admission, he denies any chest pain, shortness of breath, headache. - in ED evaded lactic acid at 2.8, thrombocytosis at 483 K, meeting significant for worsening sacral pressure ulcer with osteomyelitis, he was tachycardic as well, sepsis protocol initiated and [facility] consulted to admit.   Assessment and Plan: Principal Problem:   Sepsis (HCC) Active Problems:   Bipolar affective disorder, current episode manic with psychotic symptoms (HCC)   Sacral decubitus ulcer, stage IV (HCC)   Type 2 diabetes mellitus with complication, without long-term current use of insulin (HCC)   OSA on CPAP   Bilateral lower extremity edema: chronic with venous stasis changes   Gout     Severe sepsis, present on admission -Sepsis present on admission, tachypnea, tachycardia, fever and lethargy with elevated lactic acid -Source of sepsis secondary to infected pressure ulcer, with underlying osteomyelitis -During recent admission patient did grow Streptococcus constellatus, he was  treated with Unasyn, and still receiving Augmentin in the facility. -All cultures were sent, follow on final results. -pt was Septic despite being on Augmentin, so pt was started on vancomycin and cefepime. -Continue with IV fluids, trend lactic acid,  8/2 urine culture growing Pseudomonas and Acinetobacter 8/2 Blood cultures growing Staph hominis and staph epi 8/4 ID consulted, recommended to stop vancomycin and cefepime, started Zosyn IV every 6 hourly.  Recommended 7 more days of antibiotics from the date after debridement and transition to Augmentin if patient is being discharged prior to 07/08/22 Zosyn 8/4---8/10 for 7 days ( can be transitioned to Augmentin)   Sacral pressure ulcer, stage IV, infected with underlining osteomyelitis -General surgery s/p bedside debridement was done on 8/3 -Hold Xarelto for now if he bleeds after debridement  Resume Xarelto tomorrow a.m.     Malfunction indwelling Foley catheter -Imaging showing balloon inflated in the urethra, Foley catheter was discontinued and new 1 inserted while in ED   Pulmonary embolism -Hold Xarelto, will keep in Lovenox pending evaluation by general surgery possible need for surgical debridement   Type 2 diabetes mellitus -A1c most recently 7.6 on recent admission, will start on lower dose Semglee 30 units instead of 45, and will keep on SSI scale every 4 hours   Hypertension -Pressure is soft,  will hold metoprolol    Body mass index is 30.56 kg/m.  Nutrition Problem: Increased nutrient needs Etiology: wound healing Interventions: Interventions: Tube feeding   Pressure Injury 03/31/22 Sacrum Mid Stage 4 - Full thickness tissue loss with exposed bone, tendon or muscle. (Active)  03/31/22 1933  Location: Sacrum  Location Orientation: Mid  Staging: Stage 4 - Full thickness tissue loss with exposed bone,  tendon or muscle.  Wound Description (Comments):   Present on Admission: Yes  Dressing Type ABD 07/01/22 2000     Predebridement and postdebridement pictures are in the chart   Procedures: Excisional debridement of sacral decubitus ulcer done on 8/3  Consults General surgery and palliative care  Diet: Pured DVT Prophylaxis: Subcutaneous Lovenox   Advance goals of care discussion: Full code  Family Communication: family was present at bedside, at the time of interview.  The pt provided permission to discuss medical plan with the family. Opportunity was given to ask question and all questions were answered satisfactorily.   Disposition:  Pt is from SNF, admitted with decub ulcer sepsis/cellulitis and bacteremia, still has active infection, blood culture and urine culture pending, which precludes a safe discharge. Discharge to SNF, when clinically stable and awaiting for cultures, may need 1-2 days more.  Subjective: No significant events overnight, patient is feeling fine, stated that he did eat breakfast in the morning.  Patient denied any chest pain or palpitation, no shortness of breath, no any other active issues.    Physical Exam: General:  alert oriented to time, place, and person.  Appear in no distress, affect appropriate Eyes: PERRLA ENT: Oral Mucosa Clear, dry  Neck: no JVD,  Cardiovascular: S1 and S2 Present, no Murmur,  Respiratory: good respiratory effort, Bilateral Air entry equal and Decreased, no Crackles, no wheezes Abdomen: Bowel Sound present, Soft and no tenderness, PEG tube intact Skin:  Stage IV sacral decubitus ulcer Extremities: mild Pedal edema, no calf tenderness Neurologic: without any new focal findings Gait not checked due to patient safety concerns  Vitals:   07/02/22 0055 07/02/22 0433 07/02/22 0940 07/02/22 1214  BP: 135/82 130/89 (!) 125/90 130/83  Pulse: (!) 104 (!) 106 (!) 107 (!) 106  Resp: 20 (!) 32 20 20  Temp: 98.7 F (37.1 C) 97.7 F (36.5 C) 98.6 F (37 C) (!) 97.4 F (36.3 C)  TempSrc:   Oral Oral  SpO2: 96% 96% 97% 99%  Weight:       Height:        Intake/Output Summary (Last 24 hours) at 07/02/2022 1235 Last data filed at 07/02/2022 1127 Gross per 24 hour  Intake 2258.93 ml  Output 2525 ml  Net -266.07 ml   Filed Weights   06/30/22 1311 06/30/22 1959 07/01/22 0921  Weight: 128.3 kg 100.7 kg 102.2 kg    Data Reviewed: I have personally reviewed and interpreted daily labs, tele strips, imagings as discussed above. I reviewed all nursing notes, pharmacy notes, vitals, pertinent old records I have discussed plan of care as described above with RN and patient/family.  CBC: Recent Labs  Lab 06/30/22 1350 07/01/22 0513 07/02/22 0514  WBC 9.0 6.6 5.7  NEUTROABS 7.2  --   --   HGB 12.3* 9.9* 10.5*  HCT 39.7 31.8* 33.7*  MCV 86.7 86.4 85.3  PLT 483* 335 385   Basic Metabolic Panel: Recent Labs  Lab 06/30/22 1350 07/01/22 0513 07/02/22 0514  NA 133* 133* 133*  K 4.5 4.6 4.2  CL 97* 102 102  CO2 27 28 26   GLUCOSE 168* 105* 101*  BUN 36* 27* 23  CREATININE 0.96 0.75 0.76  CALCIUM 9.7 9.1 9.2  MG  --   --  1.7  PHOS  --   --  3.0    Studies: No results found.  Scheduled Meds:  ARIPiprazole  10 mg Oral q morning   atorvastatin  10 mg Oral q1800  Chlorhexidine Gluconate Cloth  6 each Topical Daily   collagenase   Topical BID   enoxaparin (LOVENOX) injection  100 mg Subcutaneous Q12H   feeding supplement (PROSource TF)  45 mL Per Tube BID   free water  150 mL Per Tube Q6H   insulin aspart  0-9 Units Subcutaneous Q4H   insulin glargine-yfgn  30 Units Subcutaneous Daily   liothyronine  10 mcg Oral Daily   metoprolol succinate  50 mg Oral Daily   multivitamin  15 mL Oral Daily   senna-docusate  2 tablet Oral BID   sodium hypochlorite   Irrigation BID   tamsulosin  0.4 mg Oral Daily   Vitamin D (Ergocalciferol)  50,000 Units Oral Q7 days   Continuous Infusions:  feeding supplement (GLUCERNA 1.5 CAL) 50 mL/hr at 07/01/22 1504   piperacillin-tazobactam (ZOSYN)  IV     PRN Meds:  acetaminophen  Time spent: 35 minutes  Author: Val Riles. MD [facility] 07/02/2022 12:36 PM  To reach On-call, see care teams to locate the attending and reach out to them via [email or web address]. If 7PM-7AM, please contact night-coverage If you still have difficulty reaching the attending provider, please page the [facility] (Director on Call) for [facility] on amion for assistance.

## 2022-07-03 DIAGNOSIS — A419 Sepsis, unspecified organism: Secondary | ICD-10-CM | POA: Diagnosis not present

## 2022-07-03 LAB — CBC
HCT: 37.6 % — ABNORMAL LOW (ref 39.0–52.0)
Hemoglobin: 11.9 g/dL — ABNORMAL LOW (ref 13.0–17.0)
MCH: 26.4 pg (ref 26.0–34.0)
MCHC: 31.6 g/dL (ref 30.0–36.0)
MCV: 83.6 fL (ref 80.0–100.0)
Platelets: 417 10*3/uL — ABNORMAL HIGH (ref 150–400)
RBC: 4.5 MIL/uL (ref 4.22–5.81)
RDW: 15.8 % — ABNORMAL HIGH (ref 11.5–15.5)
WBC: 6.5 10*3/uL (ref 4.0–10.5)
nRBC: 0 % (ref 0.0–0.2)

## 2022-07-03 LAB — BASIC METABOLIC PANEL
Anion gap: 7 (ref 5–15)
BUN: 20 mg/dL (ref 8–23)
CO2: 23 mmol/L (ref 22–32)
Calcium: 9.2 mg/dL (ref 8.9–10.3)
Chloride: 101 mmol/L (ref 98–111)
Creatinine, Ser: 0.74 mg/dL (ref 0.61–1.24)
GFR, Estimated: >60 mL/min (ref >60)
Glucose, Bld: 138 mg/dL — ABNORMAL HIGH (ref 70–99)
Potassium: 4.3 mmol/L (ref 3.5–5.1)
Sodium: 131 mmol/L — ABNORMAL LOW (ref 135–145)

## 2022-07-03 LAB — HEPATIC FUNCTION PANEL
ALT: 29 U/L (ref 0–44)
AST: 19 U/L (ref 15–41)
Albumin: 2.1 g/dL — ABNORMAL LOW (ref 3.5–5.0)
Alkaline Phosphatase: 66 U/L (ref 38–126)
Bilirubin, Direct: 0.1 mg/dL (ref 0.0–0.2)
Indirect Bilirubin: 0.5 mg/dL (ref 0.3–0.9)
Total Bilirubin: 0.6 mg/dL (ref 0.3–1.2)
Total Protein: 7.5 g/dL (ref 6.5–8.1)

## 2022-07-03 LAB — URINE CULTURE: Culture: >=100000 — AB

## 2022-07-03 LAB — CULTURE, BLOOD (ROUTINE X 2)

## 2022-07-03 LAB — GLUCOSE, CAPILLARY
Glucose-Capillary: 147 mg/dL — ABNORMAL HIGH (ref 70–99)
Glucose-Capillary: 151 mg/dL — ABNORMAL HIGH (ref 70–99)
Glucose-Capillary: 246 mg/dL — ABNORMAL HIGH (ref 70–99)
Glucose-Capillary: 257 mg/dL — ABNORMAL HIGH (ref 70–99)
Glucose-Capillary: 292 mg/dL — ABNORMAL HIGH (ref 70–99)
Glucose-Capillary: 84 mg/dL (ref 70–99)

## 2022-07-03 LAB — PHOSPHORUS: Phosphorus: 3.1 mg/dL (ref 2.5–4.6)

## 2022-07-03 LAB — OSMOLALITY: Osmolality: 289 mOsm/kg (ref 275–295)

## 2022-07-03 LAB — MAGNESIUM: Magnesium: 1.7 mg/dL (ref 1.7–2.4)

## 2022-07-03 MED ORDER — OXYCODONE HCL 5 MG PO TABS
5.0000 mg | ORAL_TABLET | Freq: Four times a day (QID) | ORAL | Status: DC | PRN
  Administered 2022-07-03 – 2022-07-07 (×4): 5 mg via ORAL
  Filled 2022-07-03 (×4): qty 1

## 2022-07-03 MED ORDER — HYDRALAZINE HCL 25 MG PO TABS: 50.0000 mg | ORAL_TABLET | Freq: Four times a day (QID) | ORAL | Status: DC | PRN

## 2022-07-03 MED ORDER — HYDRALAZINE HCL 20 MG/ML IJ SOLN: 10.0000 mg | Freq: Four times a day (QID) | INTRAMUSCULAR | Status: DC | PRN

## 2022-07-03 MED ORDER — METOPROLOL TARTRATE 5 MG/5ML IV SOLN
5.0000 mg | Freq: Once | INTRAVENOUS | Status: AC
  Administered 2022-07-03: 5 mg via INTRAVENOUS
  Filled 2022-07-03: qty 5

## 2022-07-03 MED ORDER — METOPROLOL TARTRATE 5 MG/5ML IV SOLN
5.0000 mg | Freq: Four times a day (QID) | INTRAVENOUS | Status: DC | PRN
  Administered 2022-07-03 – 2022-07-04 (×4): 5 mg via INTRAVENOUS
  Filled 2022-07-03 (×4): qty 5

## 2022-07-03 MED ORDER — METOPROLOL TARTRATE 5 MG/5ML IV SOLN
5.0000 mg | Freq: Four times a day (QID) | INTRAVENOUS | Status: DC | PRN
Start: 2022-07-03 — End: 2022-07-03

## 2022-07-03 MED ORDER — MORPHINE SULFATE (PF) 2 MG/ML IV SOLN
2.0000 mg | INTRAVENOUS | Status: AC | PRN
  Administered 2022-07-03 – 2022-07-06 (×4): 2 mg via INTRAVENOUS
  Filled 2022-07-03 (×4): qty 1

## 2022-07-03 NOTE — Progress Notes (Signed)
Report called and given to Inetta Fermo, RN in ICU. All questions answered to satisfaction. RN transported pt via bed with all belongings to ICU room 6.

## 2022-07-03 NOTE — Plan of Care (Signed)
  Problem: Pain Managment: Goal: General experience of comfort will improve Outcome: Progressing   Problem: Safety: Goal: Ability to remain free from injury will improve Outcome: Progressing   Problem: Skin Integrity: Goal: Risk for impaired skin integrity will decrease Outcome: Progressing   

## 2022-07-03 NOTE — Progress Notes (Signed)
[facility] Progress Note  Patient: Christian [name]    VQM:[id number]  DOA: 06/30/2022     Date of Service: the patient was seen and examined on 07/03/2022  Chief Complaint  Patient presents with   Wound Infection   Brief hospital course:  Bekim Werntz  is a 71 y.o. male, with medical history significant for diabetes mellitus, CVA, bipolar disorder, coronary artery disease, PE and DVT, CKD 3, atrial fibrillation, OSA, gastrostomy tube and chronic Foley catheter, patient with multiple recent prolonged hospitalization, most recently with severe sepsis, due to strep constellatus bacteremia and sacral osteomyelitis, currently receiving Augmentin, patient was sent by facility with worsening wound infection, facility states that they are no longer able to provide appropriate medical care for this patient, and thus they transferred him back to the hospital, patient was tachycardic, and borderline febrile of 100 Fahrenheit on admission, he denies any chest pain, shortness of breath, headache. - in ED evaded lactic acid at 2.8, thrombocytosis at 483 K, meeting significant for worsening sacral pressure ulcer with osteomyelitis, he was tachycardic as well, sepsis protocol initiated and [facility] consulted to admit.   Assessment and Plan: Principal Problem:   Sepsis (HCC) Active Problems:   Bipolar affective disorder, current episode manic with psychotic symptoms (HCC)   Sacral decubitus ulcer, stage IV (HCC)   Type 2 diabetes mellitus with complication, without long-term current use of insulin (HCC)   OSA on CPAP   Bilateral lower extremity edema: chronic with venous stasis changes   Gout     Severe sepsis, present on admission -Sepsis present on admission, tachypnea, tachycardia, fever and lethargy with elevated lactic acid -Source of sepsis secondary to infected pressure ulcer, with underlying osteomyelitis -During recent admission patient did grow Streptococcus constellatus, he was  treated with Unasyn, and still receiving Augmentin in the facility. -All cultures were sent, follow on final results. -pt was Septic despite being on Augmentin, so pt was started on vancomycin and cefepime. -Continue with IV fluids, trend lactic acid,  8/2 urine culture growing Pseudomonas and Acinetobacter 8/2 Blood cultures growing Staph hominis and staph epi 8/4 ID consulted, recommended to stop vancomycin and cefepime, started Zosyn IV every 6 hourly.  Recommended 7 more days of antibiotics from the date after debridement and transition to Augmentin if patient is being discharged prior to 07/08/22 Zosyn 8/4---8/10 for 7 days ( can be transitioned to Augmentin)   Sacral pressure ulcer, stage IV, infected with underlining osteomyelitis -General surgery s/p bedside debridement was done on 8/3 -Hold Xarelto for now if he bleeds after debridement  Resume Xarelto on 8/5     Malfunction indwelling Foley catheter -Imaging showing balloon inflated in the urethra, Foley catheter was discontinued and new 1 inserted while in ED As per ID urine culture may be a colonization  Pulmonary embolism -Hold Xarelto, s/p Lovenox, resumed Xarelto on 8/5   Type 2 diabetes mellitus -A1c most recently 7.6 on recent admission, will start on lower dose Semglee 30 units instead of 45, and will keep on SSI scale every 4 hours   Hypertension -Pressure was soft on admission but improved currently patient is hypertensive Resumed Toprol-XL 50 mg p.o. daily Use as needed metoprolol IV for hypertension and tachycardia   Isotonic hyponatremia, possible nutritional deficiency Serum osmolality 289 WNL continue to monitor sodium level   Body mass index is 30.56 kg/m.  Nutrition Problem: Increased nutrient needs Etiology: wound healing Interventions: Interventions: Tube feeding   Pressure Injury 03/31/22 Sacrum Mid Stage 4 -  Full thickness tissue loss with exposed bone, tendon or muscle. (Active)  03/31/22  1933  Location: Sacrum  Location Orientation: Mid  Staging: Stage 4 - Full thickness tissue loss with exposed bone, tendon or muscle.  Wound Description (Comments):   Present on Admission: Yes  Dressing Type Santyl;Dakin's-soaked gauze;ABD 07/03/22 1307    Predebridement and postdebridement pictures are in the chart   Procedures: Excisional debridement of sacral decubitus ulcer done on 8/3  Consults General surgery and palliative care  Diet: Pured DVT Prophylaxis: Subcutaneous Lovenox   Advance goals of care discussion: Full code  Family Communication: family was present at bedside, at the time of interview.  The pt provided permission to discuss medical plan with the family. Opportunity was given to ask question and all questions were answered satisfactorily.   Disposition:  Pt is from SNF, admitted with decub ulcer sepsis/cellulitis and bacteremia, still has active infection, blood culture and urine culture pending, which precludes a safe discharge. Discharge to SNF, when clinically stable and awaiting for cultures, may need 1-2 days more.  Subjective: No significant events overnight, patient was noticed to have tachycardia, hypotensive and increased respiratory rate.  Clinically patient denies any worsening of shortness of breath, he was lying comfortably, did not offer any complaints, denied any worsening of pain, no shortness of breath, no palpitations. We will continue monitor closely, patient may need to be transferred to stepdown unit    Physical Exam: General:  alert oriented to time, place, and person.  Appear in no distress, affect appropriate Eyes: PERRLA ENT: Oral Mucosa Clear, dry  Neck: no JVD,  Cardiovascular: S1 and S2 Present, no Murmur,  Respiratory: good respiratory effort, Bilateral Air entry equal and Decreased, no Crackles, no wheezes Abdomen: Bowel Sound present, Soft and no tenderness, PEG tube intact Skin:  Stage IV sacral decubitus  ulcer Extremities: mild Pedal edema, no calf tenderness Neurologic: without any new focal findings Gait not checked due to patient safety concerns  Vitals:   07/03/22 0844 07/03/22 0958 07/03/22 1112 07/03/22 1224  BP: (!) 185/120 (!) 156/113 (!) 158/115 (!) 153/115  Pulse: (!) 122 (!) 123 (!) 111 (!) 118  Resp: (!) 34 (!) 32 (!) 34 (!) 35  Temp: 98.3 F (36.8 C) 98.7 F (37.1 C) 98.3 F (36.8 C) 98.3 F (36.8 C)  TempSrc: Oral Oral    SpO2: 100% 98% 99% 97%  Weight:      Height:        Intake/Output Summary (Last 24 hours) at 07/03/2022 1544 Last data filed at 07/03/2022 1500 Gross per 24 hour  Intake 855.52 ml  Output 2775 ml  Net -1919.48 ml   Filed Weights   06/30/22 1311 06/30/22 1959 07/01/22 0921  Weight: 128.3 kg 100.7 kg 102.2 kg    Data Reviewed: I have personally reviewed and interpreted daily labs, tele strips, imagings as discussed above. I reviewed all nursing notes, pharmacy notes, vitals, pertinent old records I have discussed plan of care as described above with RN and patient/family.  CBC: Recent Labs  Lab 06/30/22 1350 07/01/22 0513 07/02/22 0514 07/03/22 0548  WBC 9.0 6.6 5.7 6.5  NEUTROABS 7.2  --   --   --   HGB 12.3* 9.9* 10.5* 11.9*  HCT 39.7 31.8* 33.7* 37.6*  MCV 86.7 86.4 85.3 83.6  PLT 483* 335 385 417*   Basic Metabolic Panel: Recent Labs  Lab 06/30/22 1350 07/01/22 0513 07/02/22 0514 07/03/22 0548  NA 133* 133* 133* 131*  K 4.5  4.6 4.2 4.3  CL 97* 102 102 101  CO2 27 28 26 23   GLUCOSE 168* 105* 101* 138*  BUN 36* 27* 23 20  CREATININE 0.96 0.75 0.76 0.74  CALCIUM 9.7 9.1 9.2 9.2  MG  --   --  1.7 1.7  PHOS  --   --  3.0 3.1    Studies: No results found.  Scheduled Meds:  ARIPiprazole  10 mg Oral q morning   atorvastatin  10 mg Oral q1800   Chlorhexidine Gluconate Cloth  6 each Topical Daily   collagenase   Topical BID   feeding supplement (PROSource TF)  45 mL Per Tube BID   free water  150 mL Per Tube Q6H    insulin aspart  0-9 Units Subcutaneous Q4H   insulin glargine-yfgn  30 Units Subcutaneous Daily   liothyronine  10 mcg Oral Daily   metoprolol succinate  50 mg Oral Daily   multivitamin  15 mL Oral Daily   rivaroxaban  20 mg Oral Daily   senna-docusate  2 tablet Oral BID   sodium hypochlorite   Irrigation BID   tamsulosin  0.4 mg Oral Daily   Vitamin D (Ergocalciferol)  50,000 Units Oral Q7 days   Continuous Infusions:  feeding supplement (GLUCERNA 1.5 CAL) 50 mL/hr at 07/02/22 1603   piperacillin-tazobactam (ZOSYN)  IV 3.375 g (07/03/22 1307)   PRN Meds: acetaminophen, hydrALAZINE **OR** hydrALAZINE, metoprolol tartrate, morphine injection, oxyCODONE  Time spent: 55 minutes  Author: 09/02/22. MD [facility] 07/03/2022 3:44 PM  To reach On-call, see care teams to locate the attending and reach out to them via [email or web address]. If 7PM-7AM, please contact night-coverage If you still have difficulty reaching the attending provider, please page the [facility] (Director on Call) for [facility] on amion for assistance.

## 2022-07-03 NOTE — Progress Notes (Signed)
   07/03/22 0747  Assess: MEWS Score  Temp 98.6 F (37 C)  BP (!) 164/117  MAP (mmHg) 132  Pulse Rate (!) 121  Resp (!) 32  SpO2 100 %  O2 Device Room Air  Assess: MEWS Score  MEWS Temp 0  MEWS Systolic 0  MEWS Pulse 2  MEWS RR 2  MEWS LOC 0  MEWS Score 4  MEWS Score Color Red  Assess: if the MEWS score is Yellow or Red  Were vital signs taken at a resting state? Yes  Focused Assessment Change from prior assessment (see assessment flowsheet)  Does the patient meet 2 or more of the SIRS criteria? Yes  Does the patient have a confirmed or suspected source of infection? Yes  Provider and Rapid Response Notified? Yes  MEWS guidelines implemented *See Row Information* Yes  Treat  MEWS Interventions Escalated (See documentation below)  Take Vital Signs  Increase Vital Sign Frequency  Red: Q 1hr X 4 then Q 4hr X 4, if remains red, continue Q 4hrs  Escalate  MEWS: Escalate Red: discuss with charge nurse/RN and provider, consider discussing with RRT  Notify: Charge Nurse/RN  Name of Charge Nurse/RN Notified Harriett Sine, RN  Date Charge Nurse/RN Notified 07/03/22  Time Charge Nurse/RN Notified 0750  Notify: Provider  Provider Name/Title Gillis Santa, MD  Date Provider Notified 07/03/22  Time Provider Notified [phone]  Method of Notification Page  Notification Reason Change in status  Provider response See new orders  Date of Provider Response 07/03/22  Time of Provider Response 0757  Document  Progress note created (see row info) Yes  Assess: SIRS CRITERIA  SIRS Temperature  0  SIRS Pulse 1  SIRS Respirations  1  SIRS WBC 1  SIRS Score Sum  3

## 2022-07-04 DIAGNOSIS — G4733 Obstructive sleep apnea (adult) (pediatric): Secondary | ICD-10-CM | POA: Diagnosis not present

## 2022-07-04 DIAGNOSIS — R600 Localized edema: Secondary | ICD-10-CM

## 2022-07-04 DIAGNOSIS — F312 Bipolar disorder, current episode manic severe with psychotic features: Secondary | ICD-10-CM | POA: Diagnosis not present

## 2022-07-04 DIAGNOSIS — A419 Sepsis, unspecified organism: Secondary | ICD-10-CM | POA: Diagnosis not present

## 2022-07-04 DIAGNOSIS — Z9989 Dependence on other enabling machines and devices: Secondary | ICD-10-CM

## 2022-07-04 LAB — BASIC METABOLIC PANEL
Anion gap: 5 (ref 5–15)
BUN: 22 mg/dL (ref 8–23)
CO2: 26 mmol/L (ref 22–32)
Calcium: 9.2 mg/dL (ref 8.9–10.3)
Chloride: 100 mmol/L (ref 98–111)
Creatinine, Ser: 0.89 mg/dL (ref 0.61–1.24)
GFR, Estimated: >60 mL/min (ref >60)
Glucose, Bld: 221 mg/dL — ABNORMAL HIGH (ref 70–99)
Potassium: 4.9 mmol/L (ref 3.5–5.1)
Sodium: 131 mmol/L — ABNORMAL LOW (ref 135–145)

## 2022-07-04 LAB — CBC
HCT: 35.6 % — ABNORMAL LOW (ref 39.0–52.0)
Hemoglobin: 11.3 g/dL — ABNORMAL LOW (ref 13.0–17.0)
MCH: 26.8 pg (ref 26.0–34.0)
MCHC: 31.7 g/dL (ref 30.0–36.0)
MCV: 84.6 fL (ref 80.0–100.0)
Platelets: 378 10*3/uL (ref 150–400)
RBC: 4.21 MIL/uL — ABNORMAL LOW (ref 4.22–5.81)
RDW: 15.8 % — ABNORMAL HIGH (ref 11.5–15.5)
WBC: 7.2 10*3/uL (ref 4.0–10.5)
nRBC: 0 % (ref 0.0–0.2)

## 2022-07-04 LAB — HEPATIC FUNCTION PANEL
ALT: 23 U/L (ref 0–44)
AST: 16 U/L (ref 15–41)
Albumin: 2 g/dL — ABNORMAL LOW (ref 3.5–5.0)
Alkaline Phosphatase: 59 U/L (ref 38–126)
Bilirubin, Direct: 0.1 mg/dL (ref 0.0–0.2)
Indirect Bilirubin: 0.3 mg/dL (ref 0.3–0.9)
Total Bilirubin: 0.4 mg/dL (ref 0.3–1.2)
Total Protein: 7.3 g/dL (ref 6.5–8.1)

## 2022-07-04 LAB — GLUCOSE, CAPILLARY
Glucose-Capillary: 162 mg/dL — ABNORMAL HIGH (ref 70–99)
Glucose-Capillary: 174 mg/dL — ABNORMAL HIGH (ref 70–99)
Glucose-Capillary: 179 mg/dL — ABNORMAL HIGH (ref 70–99)
Glucose-Capillary: 183 mg/dL — ABNORMAL HIGH (ref 70–99)
Glucose-Capillary: 198 mg/dL — ABNORMAL HIGH (ref 70–99)
Glucose-Capillary: 210 mg/dL — ABNORMAL HIGH (ref 70–99)
Glucose-Capillary: 243 mg/dL — ABNORMAL HIGH (ref 70–99)

## 2022-07-04 LAB — MAGNESIUM: Magnesium: 1.8 mg/dL (ref 1.7–2.4)

## 2022-07-04 LAB — PHOSPHORUS: Phosphorus: 2.5 mg/dL (ref 2.5–4.6)

## 2022-07-04 MED ORDER — ALBUMIN HUMAN 25 % IV SOLN
25.0000 g | Freq: Four times a day (QID) | INTRAVENOUS | Status: AC
  Administered 2022-07-04 – 2022-07-05 (×4): 25 g via INTRAVENOUS
  Filled 2022-07-04 (×4): qty 100

## 2022-07-04 MED ORDER — LACTATED RINGERS IV SOLN: INTRAVENOUS | Status: AC

## 2022-07-04 NOTE — Progress Notes (Signed)
Patient questioned to see if he wanted to go on HS BIPAP. Patient refused for now. Will check later for BiPAP use.

## 2022-07-04 NOTE — Progress Notes (Signed)
[facility] Progress Note  Patient: Christian [name]    GEX:[id number]  DOA: 06/30/2022     Date of Service: the patient was seen and examined on 07/04/2022  Chief Complaint  Patient presents with   Wound Infection   Brief hospital course:  Rayson Rando  is a 71 y.o. male, with medical history significant for diabetes mellitus, CVA, bipolar disorder, coronary artery disease, PE and DVT, CKD 3, atrial fibrillation, OSA, gastrostomy tube and chronic Foley catheter, patient with multiple recent prolonged hospitalization, most recently with severe sepsis, due to strep constellatus bacteremia and sacral osteomyelitis, currently receiving Augmentin, patient was sent by facility with worsening wound infection, facility states that they are no longer able to provide appropriate medical care for this patient, and thus they transferred him back to the hospital, patient was tachycardic, and borderline febrile of 100 Fahrenheit on admission, he denies any chest pain, shortness of breath, headache. - in ED evaded lactic acid at 2.8, thrombocytosis at 483 K, meeting significant for worsening sacral pressure ulcer with osteomyelitis, he was tachycardic as well, sepsis protocol initiated and [facility] consulted to admit.   Assessment and Plan: Principal Problem:   Sepsis (HCC) Active Problems:   Bipolar affective disorder, current episode manic with psychotic symptoms (HCC)   Sacral decubitus ulcer, stage IV (HCC)   Type 2 diabetes mellitus with complication, without long-term current use of insulin (HCC)   OSA on CPAP   Bilateral lower extremity edema: chronic with venous stasis changes   Gout     Severe sepsis, present on admission -Sepsis present on admission, tachypnea, tachycardia, fever and lethargy with elevated lactic acid -Source of sepsis secondary to infected pressure ulcer, with underlying osteomyelitis -During recent admission patient did grow Streptococcus constellatus, he was  treated with Unasyn, and still receiving Augmentin in the facility. -All cultures were sent, follow on final results. -pt was Septic despite being on Augmentin, so pt was started on vancomycin and cefepime. -Continue with IV fluids, trend lactic acid,  8/2 urine culture growing Pseudomonas and Acinetobacter 8/2 Blood cultures growing Staph hominis and staph epi, felt to be contaminants per ID 8/4 ID consulted, recommended to stop vancomycin and cefepime, started Zosyn IV every 6 hourly.  Recommended 7 more days of antibiotics from the date after debridement and transition to Augmentin if patient is being discharged prior to 07/08/22 Zosyn 8/4---8/10 for 7 days ( can be transitioned to Augmentin)   Sacral pressure ulcer, stage IV, infected with underlining osteomyelitis -General surgery s/p bedside debridement was done on 8/3 -Plan is to continue on IV antibiotics while in the hospital and transition back to Augmentin on discharge     Malfunction indwelling Foley catheter -Imaging showing balloon inflated in the urethra, Foley catheter was discontinued and new catheter inserted while in ED As per ID positive urine culture may be a colonization from old Foley  Pulmonary embolism -Hold Xarelto, s/p Lovenox, resumed Xarelto on 8/5   Type 2 diabetes mellitus -A1c most recently 7.6 on recent admission, will start on lower dose Semglee 30 units instead of 45, and will keep on SSI scale every 4 hours   Hypertension -Pressure was soft on admission but improved currently patient is hypertensive Resumed Toprol-XL 50 mg p.o. daily Use as needed metoprolol IV for hypertension and tachycardia   Isotonic hyponatremia, possible nutritional deficiency Serum osmolality 289 WNL continue to monitor sodium level   Body mass index is 30.56 kg/m.  Nutrition Problem: Increased nutrient needs Etiology: wound  healing Interventions: Interventions: Tube feeding   Pressure Injury 03/31/22 Sacrum Mid  Stage 4 - Full thickness tissue loss with exposed bone, tendon or muscle. (Active)  03/31/22 1933  Location: Sacrum  Location Orientation: Mid  Staging: Stage 4 - Full thickness tissue loss with exposed bone, tendon or muscle.  Wound Description (Comments):   Present on Admission: Yes  Dressing Type ABD;Santyl;Dakin's-soaked gauze 07/04/22 1600    Predebridement and postdebridement pictures are in the chart   Procedures: Excisional debridement of sacral decubitus ulcer done on 8/3  Consults General surgery and palliative care  Diet: Pured DVT Prophylaxis: Subcutaneous Lovenox   Advance goals of care discussion: Full code  Family Communication: No family present today  Disposition:  Pt is from SNF, admitted with decub ulcer sepsis/cellulitis and bacteremia, still has active infection, blood culture and urine culture pending, which precludes a safe discharge. Discharge to SNF, when clinically stable and awaiting for cultures, may need 1-2 days more.  Subjective: Complains of discomfort in his sacral area.  No nausea or vomiting.  Does not have much appetite.    Physical Exam: General:  alert oriented to time, place, and person.  Appear in no distress, affect appropriate Eyes: PERRLA ENT: Oral Mucosa Clear, dry  Neck: no JVD,  Cardiovascular: S1 and S2 Present, regular, tachycardic, no Murmur,  Respiratory: good respiratory effort, Bilateral Air entry equal and Decreased, no Crackles, no wheezes Abdomen: Bowel Sound present, Soft and no tenderness, PEG tube intact Skin:  Stage IV sacral decubitus ulcer Extremities: mild Pedal edema, no calf tenderness Neurologic: without any new focal findings Gait not checked due to patient safety concerns  Vitals:   07/04/22 1535 07/04/22 1600 07/04/22 1700 07/04/22 1800  BP:  124/85 124/85 134/84  Pulse:  (!) 116 (!) 111 (!) 110  Resp:  (!) 22 (!) 23 12  Temp: 99.3 F (37.4 C)     TempSrc: Axillary     SpO2:  95% 96% 96%   Weight:      Height:        Intake/Output Summary (Last 24 hours) at 07/04/2022 1845 Last data filed at 07/04/2022 0900 Gross per 24 hour  Intake 60 ml  Output 1300 ml  Net -1240 ml   Filed Weights   06/30/22 1311 06/30/22 1959 07/01/22 0921  Weight: 128.3 kg 100.7 kg 102.2 kg    Data Reviewed: I have personally reviewed and interpreted daily labs, tele strips, imagings as discussed above. I reviewed all nursing notes, pharmacy notes, vitals, pertinent old records I have discussed plan of care as described above with RN and patient/family.  CBC: Recent Labs  Lab 06/30/22 1350 07/01/22 0513 07/02/22 0514 07/03/22 0548 07/04/22 0539  WBC 9.0 6.6 5.7 6.5 7.2  NEUTROABS 7.2  --   --   --   --   HGB 12.3* 9.9* 10.5* 11.9* 11.3*  HCT 39.7 31.8* 33.7* 37.6* 35.6*  MCV 86.7 86.4 85.3 83.6 84.6  PLT 483* 335 385 417* 378   Basic Metabolic Panel: Recent Labs  Lab 06/30/22 1350 07/01/22 0513 07/02/22 0514 07/03/22 0548 07/04/22 0539  NA 133* 133* 133* 131* 131*  K 4.5 4.6 4.2 4.3 4.9  CL 97* 102 102 101 100  CO2 27 28 26 23 26   GLUCOSE 168* 105* 101* 138* 221*  BUN 36* 27* 23 20 22   CREATININE 0.96 0.75 0.76 0.74 0.89  CALCIUM 9.7 9.1 9.2 9.2 9.2  MG  --   --  1.7 1.7 1.8  PHOS  --   --  3.0 3.1 2.5    Studies: No results found.  Scheduled Meds:  ARIPiprazole  10 mg Oral q morning   atorvastatin  10 mg Oral q1800   Chlorhexidine Gluconate Cloth  6 each Topical Daily   collagenase   Topical BID   feeding supplement (PROSource TF)  45 mL Per Tube BID   free water  150 mL Per Tube Q6H   insulin aspart  0-9 Units Subcutaneous Q4H   insulin glargine-yfgn  30 Units Subcutaneous Daily   liothyronine  10 mcg Oral Daily   metoprolol succinate  50 mg Oral Daily   multivitamin  15 mL Oral Daily   rivaroxaban  20 mg Oral Daily   senna-docusate  2 tablet Oral BID   sodium hypochlorite   Irrigation BID   tamsulosin  0.4 mg Oral Daily   Vitamin D (Ergocalciferol)  50,000  Units Oral Q7 days   Continuous Infusions:  feeding supplement (GLUCERNA 1.5 CAL) 1,000 mL (07/04/22 1655)   piperacillin-tazobactam (ZOSYN)  IV 3.375 g (07/04/22 1426)   PRN Meds: acetaminophen, hydrALAZINE **OR** hydrALAZINE, metoprolol tartrate, morphine injection, oxyCODONE  Time spent: 35 minutes  Author: Erick Blinks. MD [facility] 07/04/2022 6:45 PM  To reach On-call, see care teams to locate the attending and reach out to them via [email or web address]. If 7PM-7AM, please contact night-coverage If you still have difficulty reaching the attending provider, please page the [facility] (Director on Call) for [facility] on amion for assistance.

## 2022-07-04 NOTE — Progress Notes (Signed)
Glucerna 1.2 supplemented for Glucerna 1.5 per provider due to availability.

## 2022-07-05 DIAGNOSIS — T148XXA Other injury of unspecified body region, initial encounter: Secondary | ICD-10-CM

## 2022-07-05 DIAGNOSIS — R339 Retention of urine, unspecified: Secondary | ICD-10-CM

## 2022-07-05 DIAGNOSIS — G4733 Obstructive sleep apnea (adult) (pediatric): Secondary | ICD-10-CM | POA: Diagnosis not present

## 2022-07-05 DIAGNOSIS — A419 Sepsis, unspecified organism: Secondary | ICD-10-CM | POA: Diagnosis not present

## 2022-07-05 DIAGNOSIS — Z515 Encounter for palliative care: Secondary | ICD-10-CM

## 2022-07-05 DIAGNOSIS — Z7189 Other specified counseling: Secondary | ICD-10-CM

## 2022-07-05 DIAGNOSIS — L089 Local infection of the skin and subcutaneous tissue, unspecified: Secondary | ICD-10-CM

## 2022-07-05 DIAGNOSIS — F312 Bipolar disorder, current episode manic severe with psychotic features: Secondary | ICD-10-CM | POA: Diagnosis not present

## 2022-07-05 DIAGNOSIS — R600 Localized edema: Secondary | ICD-10-CM | POA: Diagnosis not present

## 2022-07-05 LAB — HEPATIC FUNCTION PANEL
ALT: 20 U/L (ref 0–44)
AST: 15 U/L (ref 15–41)
Albumin: 2.6 g/dL — ABNORMAL LOW (ref 3.5–5.0)
Alkaline Phosphatase: 47 U/L (ref 38–126)
Bilirubin, Direct: 0.1 mg/dL (ref 0.0–0.2)
Indirect Bilirubin: 0.3 mg/dL (ref 0.3–0.9)
Total Bilirubin: 0.4 mg/dL (ref 0.3–1.2)
Total Protein: 6.9 g/dL (ref 6.5–8.1)

## 2022-07-05 LAB — GLUCOSE, CAPILLARY
Glucose-Capillary: 121 mg/dL — ABNORMAL HIGH (ref 70–99)
Glucose-Capillary: 115 mg/dL — ABNORMAL HIGH (ref 70–99)
Glucose-Capillary: 201 mg/dL — ABNORMAL HIGH (ref 70–99)
Glucose-Capillary: 171 mg/dL — ABNORMAL HIGH (ref 70–99)
Glucose-Capillary: 169 mg/dL — ABNORMAL HIGH (ref 70–99)

## 2022-07-05 LAB — BASIC METABOLIC PANEL
Anion gap: 4 — ABNORMAL LOW (ref 5–15)
BUN: 22 mg/dL (ref 8–23)
CO2: 27 mmol/L (ref 22–32)
Calcium: 9.3 mg/dL (ref 8.9–10.3)
Chloride: 101 mmol/L (ref 98–111)
Creatinine, Ser: 0.82 mg/dL (ref 0.61–1.24)
GFR, Estimated: >60 mL/min (ref >60)
Glucose, Bld: 136 mg/dL — ABNORMAL HIGH (ref 70–99)
Potassium: 4.2 mmol/L (ref 3.5–5.1)
Sodium: 132 mmol/L — ABNORMAL LOW (ref 135–145)

## 2022-07-05 LAB — CBC
HCT: 29.4 % — ABNORMAL LOW (ref 39.0–52.0)
Hemoglobin: 9 g/dL — ABNORMAL LOW (ref 13.0–17.0)
MCH: 26.4 pg (ref 26.0–34.0)
MCHC: 30.6 g/dL (ref 30.0–36.0)
MCV: 86.2 fL (ref 80.0–100.0)
Platelets: 325 10*3/uL (ref 150–400)
RBC: 3.41 MIL/uL — ABNORMAL LOW (ref 4.22–5.81)
RDW: 16.1 % — ABNORMAL HIGH (ref 11.5–15.5)
WBC: 6.5 10*3/uL (ref 4.0–10.5)
nRBC: 0 % (ref 0.0–0.2)

## 2022-07-05 NOTE — Progress Notes (Signed)
[facility] Progress Note  Patient: Christian [name]    UUV:[id number]  DOA: 06/30/2022     Date of Service: the patient was seen and examined on 07/05/2022  Chief Complaint  Patient presents with   Wound Infection   Brief hospital course:  Stedman [place]  is a 71 y.o. male, with medical history significant for diabetes mellitus, CVA, bipolar disorder, coronary artery disease, PE and DVT, CKD 3, atrial fibrillation, OSA, gastrostomy tube and chronic Foley catheter, patient with multiple recent prolonged hospitalization, most recently with severe sepsis, due to strep constellatus bacteremia and sacral osteomyelitis, currently receiving Augmentin, patient was sent by facility with worsening wound infection, facility states that they are no longer able to provide appropriate medical care for this patient, and thus they transferred him back to the hospital, patient was tachycardic, and borderline febrile of 100 Fahrenheit on admission, he denies any chest pain, shortness of breath, headache. - in ED evaded lactic acid at 2.8, thrombocytosis at 483 K, meeting significant for worsening sacral pressure ulcer with osteomyelitis, he was tachycardic as well, sepsis protocol initiated and [facility] consulted to admit.   Assessment and Plan: Principal Problem:   Sepsis (HCC) Active Problems:   Bipolar affective disorder, current episode manic with psychotic symptoms (HCC)   Sacral decubitus ulcer, stage IV (HCC)   Type 2 diabetes mellitus with complication, without long-term current use of insulin (HCC)   OSA on CPAP   Bilateral lower extremity edema: chronic with venous stasis changes   Gout     Severe sepsis, present on admission -Sepsis present on admission, tachypnea, tachycardia, fever and lethargy with elevated lactic acid -Source of sepsis secondary to infected pressure ulcer, with underlying osteomyelitis -During recent admission patient did grow Streptococcus constellatus, he was  treated with Unasyn, and still receiving Augmentin in the facility. -All cultures were sent, follow on final results. -pt was Septic despite being on Augmentin, so pt was started on vancomycin and cefepime. -Continue with IV fluids, trend lactic acid,  8/2 urine culture growing Pseudomonas and Acinetobacter 8/2 Blood cultures growing Staph hominis and staph epi, felt to be contaminants per ID 8/4 ID consulted, recommended to stop vancomycin and cefepime, started Zosyn IV every 6 hourly.  Recommended 7 more days of antibiotics from the date after debridement and transition to Augmentin if patient is being discharged prior to 07/08/22 Zosyn 8/4---8/10 for 7 days ( can be transitioned to Augmentin)   Sacral pressure ulcer, stage IV, infected with underlining osteomyelitis -General surgery s/p bedside debridement was done on 8/3 -Plan is to continue on IV antibiotics while in the hospital and transition back to Augmentin on discharge     Malfunction indwelling Foley catheter -Imaging showing balloon inflated in the urethra, Foley catheter was discontinued and new catheter inserted while in ED As per ID positive urine culture may be a colonization from old Foley  Pulmonary embolism -Anticoagulated with Xarelto   Type 2 diabetes mellitus -A1c most recently 7.6 on recent admission, will start on lower dose Semglee 30 units instead of 45, and will keep on SSI scale every 4 hours   Hypertension -Pressure was soft on admission but improved currently patient is hypertensive Resumed Toprol-XL 50 mg p.o. daily Use as needed metoprolol IV for hypertension and tachycardia   Isotonic hyponatremia, possible nutritional deficiency Serum osmolality 289 WNL continue to monitor sodium level   Body mass index is 40.28 kg/m.  Nutrition Problem: Increased nutrient needs Etiology: wound healing Interventions: Interventions: Tube feeding  Pressure Injury 03/31/22 Sacrum Mid Stage 4 - Full thickness  tissue loss with exposed bone, tendon or muscle. (Active)  03/31/22 1933  Location: Sacrum  Location Orientation: Mid  Staging: Stage 4 - Full thickness tissue loss with exposed bone, tendon or muscle.  Wound Description (Comments):   Present on Admission: Yes  Dressing Type ABD;Santyl;Dakin's-soaked gauze 07/04/22 1600    Predebridement and postdebridement pictures are in the chart   Procedures: Excisional debridement of sacral decubitus ulcer done on 8/3  Consults General surgery and palliative care  Diet: Pured DVT Prophylaxis: Therapeutic Anticoagulation with Xarelto    Advance goals of care discussion: Full code  Family Communication: No family present today.  Left voicemail for legal guardian  Disposition:  Pt is from SNF, admitted with decub ulcer sepsis/cellulitis and bacteremia, still has active infection, blood culture and urine culture pending, which precludes a safe discharge. Discharge to SNF, when clinically stable.  We will transfer to MedSurg today.  Anticipate discharge in the next 24 hours.  Subjective: Reports that he is still has some discomfort in his sacral area.  Tolerating his diet.  Denies any shortness of breath.    Physical Exam: General:  alert oriented to time, place, and person.  Appear in no distress, affect appropriate Eyes: PERRLA ENT: Oral Mucosa Clear, dry  Neck: no JVD,  Cardiovascular: S1 and S2 Present, regular, tachycardic, no Murmur,  Respiratory: good respiratory effort, Bilateral Air entry equal and Decreased, no Crackles, no wheezes Abdomen: Bowel Sound present, Soft and no tenderness, PEG tube intact Skin:  Stage IV sacral decubitus ulcer Extremities: mild Pedal edema, no calf tenderness Neurologic: without any new focal findings   Vitals:   07/05/22 1000 07/05/22 1100 07/05/22 1129 07/05/22 1200  BP: (!) 144/88 (!) 141/76  113/65  Pulse: (!) 103 (!) 105 (!) 102 (!) 101  Resp: (!) 29 (!) 25 (!) 24 (!) 31  Temp:   98.9 F  (37.2 C)   TempSrc:   Oral   SpO2: 98% 93% 92% 97%  Weight:      Height:        Intake/Output Summary (Last 24 hours) at 07/05/2022 1245 Last data filed at 07/05/2022 1000 Gross per 24 hour  Intake 3016.85 ml  Output 1780 ml  Net 1236.85 ml   Filed Weights   06/30/22 1959 07/01/22 0921 07/05/22 0507  Weight: 100.7 kg 102.2 kg 134.7 kg    Data Reviewed: I have personally reviewed and interpreted daily labs, tele strips, imagings as discussed above. I reviewed all nursing notes, pharmacy notes, vitals, pertinent old records I have discussed plan of care as described above with RN and patient/family.  CBC: Recent Labs  Lab 06/30/22 1350 07/01/22 0513 07/02/22 0514 07/03/22 0548 07/04/22 0539 07/05/22 0608  WBC 9.0 6.6 5.7 6.5 7.2 6.5  NEUTROABS 7.2  --   --   --   --   --   HGB 12.3* 9.9* 10.5* 11.9* 11.3* 9.0*  HCT 39.7 31.8* 33.7* 37.6* 35.6* 29.4*  MCV 86.7 86.4 85.3 83.6 84.6 86.2  PLT 483* 335 385 417* 378 325   Basic Metabolic Panel: Recent Labs  Lab 07/01/22 0513 07/02/22 0514 07/03/22 0548 07/04/22 0539 07/05/22 0608  NA 133* 133* 131* 131* 132*  K 4.6 4.2 4.3 4.9 4.2  CL 102 102 101 100 101  CO2 28 26 23 26 27   GLUCOSE 105* 101* 138* 221* 136*  BUN 27* 23 20 22 22   CREATININE 0.75 0.76 0.74 0.89  0.82  CALCIUM 9.1 9.2 9.2 9.2 9.3  MG  --  1.7 1.7 1.8  --   PHOS  --  3.0 3.1 2.5  --     Studies: No results found.  Scheduled Meds:  ARIPiprazole  10 mg Oral q morning   atorvastatin  10 mg Oral q1800   Chlorhexidine Gluconate Cloth  6 each Topical Daily   collagenase   Topical BID   feeding supplement (PROSource TF)  45 mL Per Tube BID   free water  150 mL Per Tube Q6H   insulin aspart  0-9 Units Subcutaneous Q4H   insulin glargine-yfgn  30 Units Subcutaneous Daily   liothyronine  10 mcg Oral Daily   metoprolol succinate  50 mg Oral Daily   multivitamin  15 mL Oral Daily   rivaroxaban  20 mg Oral Daily   senna-docusate  2 tablet Oral BID    sodium hypochlorite   Irrigation BID   tamsulosin  0.4 mg Oral Daily   Vitamin D (Ergocalciferol)  50,000 Units Oral Q7 days   Continuous Infusions:  albumin human 25 g (07/05/22 0833)   feeding supplement (GLUCERNA 1.5 CAL) 50 mL/hr at 07/05/22 1000   piperacillin-tazobactam (ZOSYN)  IV 12.5 mL/hr at 07/05/22 1000   PRN Meds: acetaminophen, hydrALAZINE **OR** hydrALAZINE, metoprolol tartrate, morphine injection, oxyCODONE  Time spent: 35 minutes  Author: Erick Blinks. MD [facility] 07/05/2022 12:45 PM  To reach On-call, see care teams to locate the attending and reach out to them via [email or web address]. If 7PM-7AM, please contact night-coverage If you still have difficulty reaching the attending provider, please page the [facility] (Director on Call) for [facility] on amion for assistance.

## 2022-07-05 NOTE — Progress Notes (Signed)
  Daily Progress Note   Patient Name: Christian [name]       Date: 07/05/2022 DOB: [birth year]  Age: 71 y.o. MRN#: [id number] Attending Physician: Erick Blinks, MD Primary Care Physician: Charlynne Pander, MD Admit Date: 06/30/2022 Length of Stay: 5 days  Reason for Consultation/Follow-up: {Reason for Consult:23484}  HPI/Patient Profile:  ***  Subjective:   Subjective: Chart Reviewed. Updates received. Patient Assessed. Created space and opportunity for patient  and family to explore thoughts and feelings regarding current medical situation.  Today's Discussion: ***  Review of Systems  Objective:   Vital Signs:  BP 139/83   Pulse 99   Temp 98.7 F (37.1 C) (Axillary)   Resp 16   Ht 6' (1.829 m)   Wt 134.7 kg   SpO2 99%   BMI 40.28 kg/m   Physical Exam: Physical Exam  Palliative Assessment/Data: ***   Assessment & Plan:   Impression: Present on Admission:  Sepsis (HCC)  Type 2 diabetes mellitus with complication, without long-term current use of insulin (HCC)  Bilateral lower extremity edema: chronic with venous stasis changes  Gout  Bipolar affective disorder, current episode manic with psychotic symptoms (HCC)  Sacral decubitus ulcer, stage IV (HCC)  ***  SUMMARY OF RECOMMENDATIONS   ***  Symptom Management:  ***  Code Status: {Palliative Code status:23503}  Prognosis: {Palliative Care Prognosis:23504}  Discharge Planning: {Palliative dispostion:23505}  Discussed with: ***  Thank you for allowing [place] to participate in the care of Joycie Peek Tayag PMT will continue to support holistically.  Time Total: ***  Visit consisted of counseling and education dealing with the complex and emotionally intense issues of symptom management and palliative care in the setting of serious and potentially life-threatening illness. Greater than 50%  of this time was spent counseling and coordinating care related to the above assessment and plan.  Wynne Dust,  NP Palliative Medicine Team  Team Phone # [phone] (Nights/Weekends)  07/28/2021, 8:17 AM

## 2022-07-05 NOTE — TOC Progression Note (Signed)
Transition of Care [facility]) - Progression Note    Patient Details  Name: Christian [name] MRN: [id number] Date of Birth: [birth year]  Transition of [facility]) CM/SW Contact  Karn Cassis, [place] Phone Number: 07/05/2022, 11:40 AM  Clinical Narrative:  Per CMA, peer-to-peer offered on pt for SNF auth. MD notified and completed. Auth was cancelled by [facility] as pt is not medically stable for d/c. CMA to restart authorization when appropriate.      Expected Discharge Plan: Skilled Nursing Facility Barriers to Discharge: Continued Medical Work up  Expected Discharge Plan and Services Expected Discharge Plan: Skilled Nursing Facility       Living arrangements for the past 2 months: Skilled Nursing Facility                                       Social Determinants of Health (SDOH) Interventions    Readmission Risk Interventions    05/28/2022   12:57 PM 05/14/2022   12:39 PM 04/13/2022    3:06 PM  Readmission Risk Prevention Plan  Transportation Screening Complete Complete   PCP or Specialist Appt within 3-5 Days Complete  Complete  HRI or Home Care Consult Complete  Complete  Social Work Consult for Recovery Care Planning/Counseling   Complete  Palliative Care Screening Not Complete  Not Applicable  Medication Review Oceanographer) Complete Complete Referral to Pharmacy  PCP or Specialist appointment within 3-5 days of discharge  Complete   HRI or Home Care Consult  Complete   Palliative Care Screening  Not Applicable   Skilled Nursing Facility  Complete

## 2022-07-05 NOTE — Progress Notes (Signed)
BiPAP placed on Patient

## 2022-07-05 NOTE — Progress Notes (Addendum)
Asked patient about wearing CPAP, patient stated he was not going to use it tonight.  I asked if he used his at home, patient stated that he did not use it at home all the time.  I asked patient if he changes his mind to please have the RN call us and we could put him on.

## 2022-07-06 DIAGNOSIS — G4733 Obstructive sleep apnea (adult) (pediatric): Secondary | ICD-10-CM | POA: Diagnosis not present

## 2022-07-06 DIAGNOSIS — A419 Sepsis, unspecified organism: Secondary | ICD-10-CM | POA: Diagnosis not present

## 2022-07-06 DIAGNOSIS — F312 Bipolar disorder, current episode manic severe with psychotic features: Secondary | ICD-10-CM | POA: Diagnosis not present

## 2022-07-06 DIAGNOSIS — L89154 Pressure ulcer of sacral region, stage 4: Secondary | ICD-10-CM | POA: Diagnosis not present

## 2022-07-06 LAB — BASIC METABOLIC PANEL
Anion gap: 5 (ref 5–15)
BUN: 21 mg/dL (ref 8–23)
CO2: 28 mmol/L (ref 22–32)
Calcium: 9.6 mg/dL (ref 8.9–10.3)
Chloride: 101 mmol/L (ref 98–111)
Creatinine, Ser: 0.74 mg/dL (ref 0.61–1.24)
GFR, Estimated: >60 mL/min (ref >60)
Glucose, Bld: 126 mg/dL — ABNORMAL HIGH (ref 70–99)
Potassium: 4.4 mmol/L (ref 3.5–5.1)
Sodium: 134 mmol/L — ABNORMAL LOW (ref 135–145)

## 2022-07-06 LAB — CBC
HCT: 29.4 % — ABNORMAL LOW (ref 39.0–52.0)
Hemoglobin: 9.2 g/dL — ABNORMAL LOW (ref 13.0–17.0)
MCH: 26.7 pg (ref 26.0–34.0)
MCHC: 31.3 g/dL (ref 30.0–36.0)
MCV: 85.2 fL (ref 80.0–100.0)
Platelets: 332 10*3/uL (ref 150–400)
RBC: 3.45 MIL/uL — ABNORMAL LOW (ref 4.22–5.81)
RDW: 15.9 % — ABNORMAL HIGH (ref 11.5–15.5)
WBC: 6.7 10*3/uL (ref 4.0–10.5)
nRBC: 0 % (ref 0.0–0.2)

## 2022-07-06 LAB — GLUCOSE, CAPILLARY
Glucose-Capillary: 121 mg/dL — ABNORMAL HIGH (ref 70–99)
Glucose-Capillary: 122 mg/dL — ABNORMAL HIGH (ref 70–99)
Glucose-Capillary: 138 mg/dL — ABNORMAL HIGH (ref 70–99)
Glucose-Capillary: 143 mg/dL — ABNORMAL HIGH (ref 70–99)
Glucose-Capillary: 144 mg/dL — ABNORMAL HIGH (ref 70–99)
Glucose-Capillary: 147 mg/dL — ABNORMAL HIGH (ref 70–99)
Glucose-Capillary: 147 mg/dL — ABNORMAL HIGH (ref 70–99)
Glucose-Capillary: 155 mg/dL — ABNORMAL HIGH (ref 70–99)

## 2022-07-06 LAB — CULTURE, BLOOD (ROUTINE X 2)
Culture: NO GROWTH
Special Requests: ADEQUATE

## 2022-07-06 MED ORDER — OXYCODONE HCL 5 MG PO TABS: 5.0000 mg | ORAL_TABLET | Freq: Four times a day (QID) | ORAL | 0 refills | Status: AC | PRN

## 2022-07-06 MED ORDER — INSULIN GLARGINE-YFGN 100 UNIT/ML ~~LOC~~ SOLN: 30.0000 [IU] | Freq: Every day | SUBCUTANEOUS | 0 refills | Status: AC

## 2022-07-06 MED ORDER — PROSOURCE TF20 ENFIT COMPATIBL EN LIQD
60.0000 mL | Freq: Two times a day (BID) | ENTERAL | Status: DC
  Administered 2022-07-06 – 2022-07-07 (×3): 60 mL
  Filled 2022-07-06 (×2): qty 60

## 2022-07-06 MED ORDER — COLLAGENASE 250 UNIT/GM EX OINT: TOPICAL_OINTMENT | Freq: Two times a day (BID) | CUTANEOUS | 0 refills | Status: AC

## 2022-07-06 NOTE — Progress Notes (Signed)
Pt sacral wound dressing changed this shift. 4x4 gauze soaked in Dankins solution used to pack the wound, Santyl ointment used with ABD pad to cover with tape.

## 2022-07-06 NOTE — Progress Notes (Signed)
Patient is refusing CPAP for tonight.  Machine is at bedside.

## 2022-07-06 NOTE — Care Management Important Message (Signed)
Important Message  Patient Details  Name: Christian [name] MRN: [id number] Date of Birth: October 29, 1951   Medicare Important Message Given:  Yes (spoke with legal guardian [name] at [phone] to review letter, copy to be provided during visit today)     [name] 07/06/2022, 3:35 PM

## 2022-07-06 NOTE — Discharge Summary (Signed)
Physician Discharge Summary  Christian [name] Hupfer ZOX:[id number] DOB: [birth year] DOA: 06/30/2022  PCP: Charlynne Pander, MD  Admit date: 06/30/2022 Discharge date: 07/06/2022  Admitted From: Skilled nursing facility Disposition: Skilled nursing facility  Recommendations for Outpatient Follow-up:  Follow up with PCP in 1-2 weeks Please obtain BMP/CBC in one week Follow-up with wound care center   Discharge Condition: Stable CODE STATUS: Full code Diet recommendation: Tube feeding, dysphagia 1 diet with thin liquids  Brief/Interim Summary:  Christian [name]  is a 71 y.o. male, with medical history significant for diabetes mellitus, CVA, bipolar disorder, coronary artery disease, PE and DVT, CKD 3, atrial fibrillation, OSA, gastrostomy tube and chronic Foley catheter, patient with multiple recent prolonged hospitalization, most recently with severe sepsis, due to strep constellatus bacteremia and sacral osteomyelitis, currently receiving Augmentin, patient was sent by facility with worsening wound infection, facility states that they are no longer able to provide appropriate medical care for this patient, and thus they transferred him back to the hospital, patient was tachycardic, and borderline febrile of 100 Fahrenheit on admission, he denies any chest pain, shortness of breath, headache. - in ED evaded lactic acid at 2.8, thrombocytosis at 483 K, meeting significant for worsening sacral pressure ulcer with osteomyelitis, he was tachycardic as well, sepsis protocol initiated and [facility] hospitalist consulted to admit.  Discharge Diagnoses:  Principal Problem:   Sepsis (HCC) Active Problems:   Bipolar affective disorder, current episode manic with psychotic symptoms (HCC)   Sacral decubitus ulcer, stage IV (HCC)   Type 2 diabetes mellitus with complication, without long-term current use of insulin (HCC)   OSA on CPAP   Bilateral lower extremity edema: chronic with venous stasis changes   Gout  Severe  sepsis, present on admission -Sepsis present on admission, tachypnea, tachycardia, fever and lethargy with elevated lactic acid -Source of sepsis secondary to infected pressure ulcer, with underlying osteomyelitis -During recent admission patient did grow Streptococcus constellatus, he was treated with Unasyn, and still receiving Augmentin in the facility. -All cultures were sent, follow on final results. -pt was Septic despite being on Augmentin, so pt was started on vancomycin and cefepime. -Continue with IV fluids, trend lactic acid,  8/2 urine culture growing Pseudomonas and Acinetobacter 8/2 Blood cultures growing Staph hominis and staph epi, felt to be contaminants per ID 8/4 ID consulted, recommended to stop vancomycin and cefepime, started Zosyn IV every 6 hourly.  Recommended 7 more days of antibiotics from the date after debridement and transition to Augmentin if patient is being discharged prior to 07/08/22 Zosyn 8/4---8/10 for 7 days ( can be transitioned to Augmentin)     Sacral pressure ulcer, stage IV, infected with underlining osteomyelitis -General surgery s/p bedside debridement was done on 8/3 -Wound care: Apply Santyl to the base of the wound.  Pack with damp Kerlix, making sure to pack into entire depth of wound bed, and cover with ABD pads, secure with Medipore tape. Change BID -Plan is to continue on IV antibiotics while in the hospital and transition back to Augmentin on discharge      Malfunction indwelling Foley catheter -Imaging showing balloon inflated in the urethra, Foley catheter was discontinued and new catheter inserted while in ED As per ID positive urine culture may be a colonization from old Foley   Pulmonary embolism -Anticoagulated with Xarelto   Type 2 diabetes mellitus -A1c most recently 7.6 on recent admission, will start on lower dose Semglee 30 units instead of 45, and will keep on SSI scale  every 4 hours   Hypertension -Pressure was soft on  admission but improved currently patient is hypertensive Resumed Toprol-XL 50 mg p.o. daily Use as needed metoprolol IV for hypertension and tachycardia   Isotonic hyponatremia, possible nutritional deficiency Serum osmolality 289 WNL continue to monitor sodium level  Discharge Instructions   Allergies as of 07/06/2022       Reactions   Amlodipine Swelling, Other (See Comments)   Legs became swollen   Onglyza [saxagliptin] Other (See Comments)   "Allergic," per [facility]   Zyprexa [olanzapine] Other (See Comments)   Allergy noted by ACT Team. No reaction specified (??)- "Allergic," per [facility]   Ritalin [methylphenidate] Anxiety, Other (See Comments)   "Maniac"        Medication List     TAKE these medications    acetaminophen 500 MG tablet Commonly known as: TYLENOL Take 2 tablets (1,000 mg total) by mouth every 6 (six) hours as needed for mild pain, fever or headache (pain.).   amoxicillin-clavulanate 875-125 MG tablet Commonly known as: AUGMENTIN Take 1 tablet by mouth every 12 (twelve) hours for 27 days.   ARIPiprazole 10 MG tablet Commonly known as: ABILIFY Take 1 tablet (10 mg total) by mouth every morning.   atorvastatin 10 MG tablet Commonly known as: LIPITOR Take 1 tablet (10 mg total) by mouth daily at 6 PM. What changed: when to take this   benztropine 1 MG tablet Commonly known as: COGENTIN Take 1 tablet (1 mg total) by mouth 2 (two) times daily.   bisacodyl 5 MG EC tablet Commonly known as: DULCOLAX Take 2 tablets (10 mg total) by mouth daily as needed for moderate constipation.   collagenase 250 UNIT/GM ointment Commonly known as: SANTYL Apply topically 2 (two) times daily.   feeding supplement (PROSource TF) liquid Place 45 mLs into feeding tube 3 (three) times daily.   feeding supplement (GLUCERNA 1.5 CAL) Liqd Place 1,000 mLs into feeding tube continuous. 1,000 mL, Per Tube, at 65 mL/hr, Continuous   free water  Soln Place 150 mLs into feeding tube every 6 (six) hours.   insulin aspart 100 UNIT/ML FlexPen Commonly known as: NOVOLOG Before each meal 3 times a day, 140-199 - 2 units, 200-250 - 4 units, 251-299 - 6 units,  300-349 - 8 units,  350 or above 10 units. Insulin PEN if approved, provide syringes and needles if needed.   insulin aspart 100 UNIT/ML injection Commonly known as: novoLOG Inject 0-20 Units into the skin every 6 (six) hours.   insulin glargine-yfgn 100 UNIT/ML injection Commonly known as: SEMGLEE Inject 0.3 mLs (30 Units total) into the skin daily. What changed: how much to take   liothyronine 5 MCG tablet Commonly known as: CYTOMEL Take 10 mcg by mouth daily.   Melatonin 10 MG Tabs Take 10 mg by mouth at bedtime as needed (sleep).   metoprolol succinate 50 MG 24 hr tablet Commonly known as: TOPROL-XL Take 1 tablet (50 mg total) by mouth daily. Take with or immediately following a meal. What changed: additional instructions   oxyCODONE 5 MG immediate release tablet Commonly known as: Oxy IR/ROXICODONE Take 1 tablet (5 mg total) by mouth every 6 (six) hours as needed for severe pain.   polyethylene glycol powder 17 GM/SCOOP powder Commonly known as: GLYCOLAX/MIRALAX Take 17 g by mouth daily as needed for mild constipation.   rivaroxaban 20 MG Tabs tablet Commonly known as: XARELTO Take 1 tablet (20 mg total) by mouth daily with  supper. What changed: when to take this   senna-docusate 8.6-50 MG tablet Commonly known as: Senokot-S Take 2 tablets by mouth 2 (two) times daily.   tamsulosin 0.4 MG Caps capsule Commonly known as: FLOMAX Take 1 capsule (0.4 mg total) by mouth daily.   Vitamin D (Ergocalciferol) 1.25 MG (50000 UNIT) Caps capsule Commonly known as: DRISDOL Take 50,000 Units by mouth every 7 (seven) days.        Contact information for after-discharge care     Destination     [facility] .    Service: Skilled Nursing Contact information: [address] [place] [address] [phone]                    Allergies  Allergen Reactions   Amlodipine Swelling and Other (See Comments)    Legs became swollen   Onglyza [Saxagliptin] Other (See Comments)    "Allergic," per [facility]   Zyprexa [Olanzapine] Other (See Comments)    Allergy noted by ACT Team. No reaction specified (??)- "Allergic," per [facility]   Ritalin [Methylphenidate] Anxiety and Other (See Comments)    "Maniac"    Consultations: General surgery Infectious disease Palliative care   Procedures/Studies: CT ABDOMEN PELVIS W CONTRAST  Result Date: 06/30/2022 CLINICAL DATA:  Sepsis, concern for sacral osteomyelitis EXAM: CT ABDOMEN AND PELVIS WITH CONTRAST TECHNIQUE: Multidetector CT imaging of the abdomen and pelvis was performed using the standard protocol following bolus administration of intravenous contrast. RADIATION DOSE REDUCTION: This exam was performed according to the departmental dose-optimization program which includes automated exposure control, adjustment of the mA and/or kV according to patient size and/or use of iterative reconstruction technique. CONTRAST:  OMNIPAQUE IOHEXOL 300 MG/ML  SOLN COMPARISON:  CT abdomen and pelvis 05/27/2022 FINDINGS: Lower chest: Bibasilar scarring/atelectasis. Trace left pleural effusion. Cardiomegaly. Coronary atherosclerosis and/or stenting. No pericardial effusion. Hepatobiliary: No focal liver abnormality is seen. No gallstones, gallbladder wall thickening, or biliary dilatation. Pancreas: Unremarkable. No pancreatic ductal dilatation or surrounding inflammatory changes. Spleen: Normal in size without focal abnormality. Adrenals/Urinary Tract: Decreased size of the extrarenal pelvis on the left. No hydronephrosis or perinephric stranding. Adrenal glands are unremarkable. Marked distention of the urinary bladder. The Foley  balloon appears to be within the urethra. Stomach/Bowel: Stomach is within normal limits. Appendix appears normal. No evidence of bowel wall thickening, distention, or inflammatory changes. Large colonic stool load. Rectal wall thickening. Large stool ball in the rectum. PEG tube. Vascular/Lymphatic: Aortoiliac atherosclerotic calcification. No aneurysm. No suspicious abdominopelvic adenopathy. Reproductive: Prostate is unremarkable. Other: No free intraperitoneal air. Musculoskeletal: Redemonstrated sacral decubitus ulcer overlying the sacrum and coccyx and extending inferiorly along the gluteal cleft. The area of ulceration measures approximally 4.2 cm in width and 12.1 cm in craniocaudal dimension. The ulcer extends to the inferior portion of the sacrum and coccyx where there is cortical disruption and osseous resorption compatible with sacral osteomyelitis, progressed from 05/27/2022. Adjacent inflammatory fat stranding within the subcutaneous tissues of the back. Additional soft tissue thickening and edema/stranding within the presacral space. No drainable fluid collection. IMPRESSION: 1. Sacral decubitus ulcer. The ulcer extends to distal sacrum and coccyx where there is increasing erosive change since 05/27/2022 compatible osteomyelitis. 2. Persistent rectal wall thickening and surrounding inflammation consistent with proctitis. 3. Marked distention of the bladder with Foley balloon in the urethra concerning for bladder outlet obstruction. 4. Aortic Atherosclerosis (ICD10-I70.0). These results were called by telephone at the time of interpretation  on 06/30/2022 at 4:25 pm to provider Dr. Effie Shy, who verbally acknowledged these results. Electronically Signed   By: Minerva Fester M.D.   On: 06/30/2022 16:52   DG Chest Port 1 View  Result Date: 06/30/2022 CLINICAL DATA:  Possible sepsis, worsening sacral wound. EXAM: PORTABLE CHEST 1 VIEW COMPARISON:  05/27/2022 and CT chest 05/27/2022. FINDINGS: Trachea is  midline. Heart size is accentuated by AP semi upright technique and low lung volumes. Left basilar collapse/consolidation. Lungs are otherwise clear. IMPRESSION: Left basilar collapse/consolidation may be due to atelectasis. Difficult to exclude pneumonia. Electronically Signed   By: Leanna Battles M.D.   On: 06/30/2022 13:45      Subjective: He says he does have some pain in his sacral area.  Denies any cough or shortness of breath.  Does not have any other new complaints  Discharge Exam: Vitals:   07/06/22 0125 07/06/22 0542 07/06/22 1346 07/06/22 2027  BP: 136/76 129/88 126/81 128/77  Pulse: 100 (!) 107 100 100  Resp: 18 19 18 19   Temp: 98.7 F (37.1 C) 98.8 F (37.1 C) 98.3 F (36.8 C) (!) 97.5 F (36.4 C)  TempSrc: Oral Oral Oral Oral  SpO2: 100% 99% 98% 99%  Weight:      Height:        General: Pt is alert, awake, not in acute distress Cardiovascular: RRR, S1/S2 +, no rubs, no gallops Respiratory: CTA bilaterally, no wheezing, no rhonchi Abdominal: Soft, NT, ND, bowel sounds + Extremities: 1+edema, no cyanosis    The results of significant diagnostics from this hospitalization (including imaging, microbiology, ancillary and laboratory) are listed below for reference.     Microbiology: Recent Results (from the past 240 hour(s))  Resp Panel by RT-PCR (Flu A&B, Covid) Anterior Nasal Swab     Status: None   Collection Time: 06/30/22  1:17 PM   Specimen: Anterior Nasal Swab  Result Value Ref Range Status   SARS Coronavirus 2 by RT PCR NEGATIVE NEGATIVE Final    Comment: (NOTE) SARS-CoV-2 target nucleic acids are NOT DETECTED.  The SARS-CoV-2 RNA is generally detectable in upper respiratory specimens during the acute phase of infection. The lowest concentration of SARS-CoV-2 viral copies this assay can detect is 138 copies/mL. A negative result does not preclude SARS-Cov-2 infection and should not be used as the sole basis for treatment or other patient management  decisions. A negative result may occur with  improper specimen collection/handling, submission of specimen other than nasopharyngeal swab, presence of viral mutation(s) within the areas targeted by this assay, and inadequate number of viral copies(<138 copies/mL). A negative result must be combined with clinical observations, patient history, and epidemiological information. The expected result is Negative.  Fact Sheet for Patients:  08/30/22  Fact Sheet for Healthcare Providers:  BloggerCourse.com  This test is no t yet approved or cleared by the [employer] FDA and  has been authorized for detection and/or diagnosis of SARS-CoV-2 by FDA under an Emergency Use Authorization (EUA). This EUA will remain  in effect (meaning this test can be used) for the duration of the COVID-19 declaration under Section 564(b)(1) of the Act, 21 U.S.C.section 360bbb-3(b)(1), unless the authorization is terminated  or revoked sooner.       Influenza A by PCR NEGATIVE NEGATIVE Final   Influenza B by PCR NEGATIVE NEGATIVE Final    Comment: (NOTE) The Xpert Xpress SARS-CoV-2/FLU/RSV plus assay is intended as an aid in the diagnosis of influenza from Nasopharyngeal swab specimens and should not be  used as a sole basis for treatment. Nasal washings and aspirates are unacceptable for Xpert Xpress SARS-CoV-2/FLU/RSV testing.  Fact Sheet for Patients: BloggerCourse.com  Fact Sheet for Healthcare Providers: SeriousBroker.it  This test is not yet approved or cleared by the [place] FDA and has been authorized for detection and/or diagnosis of SARS-CoV-2 by FDA under an Emergency Use Authorization (EUA). This EUA will remain in effect (meaning this test can be used) for the duration of the COVID-19 declaration under Section 564(b)(1) of the Act, 21 U.S.C. section 360bbb-3(b)(1), unless the  authorization is terminated or revoked.  Performed at [facility], [address] [place], [place] [address]   Urine Culture     Status: Abnormal   Collection Time: 06/30/22  1:17 PM   Specimen: Urine, Suprapubic  Result Value Ref Range Status   Specimen Description   Final    URINE, SUPRAPUBIC Performed at [facility], [address], [place], [place] [address]    Special Requests   Final    NONE Performed at [facility], [address], [place], [place] [address]    Culture (A)  Final    >=100,000 COLONIES/mL PSEUDOMONAS AERUGINOSA >=100,000 COLONIES/mL ACINETOBACTER BAUMANNII    Report Status 07/03/2022 FINAL  Final   Organism ID, Bacteria PSEUDOMONAS AERUGINOSA (A)  Final   Organism ID, Bacteria ACINETOBACTER BAUMANNII (A)  Final      Susceptibility   Acinetobacter baumannii - MIC*    CEFTAZIDIME 4 SENSITIVE Sensitive     CIPROFLOXACIN >=4 RESISTANT Resistant     GENTAMICIN 2 SENSITIVE Sensitive     IMIPENEM 8 INTERMEDIATE Intermediate     PIP/TAZO >=128 RESISTANT Resistant     TRIMETH/SULFA <=20 SENSITIVE Sensitive     AMPICILLIN/SULBACTAM 4 SENSITIVE Sensitive     * >=100,000 COLONIES/mL ACINETOBACTER BAUMANNII   Pseudomonas aeruginosa - MIC*    CEFTAZIDIME 2 SENSITIVE Sensitive     CIPROFLOXACIN 0.5 SENSITIVE Sensitive     GENTAMICIN <=1 SENSITIVE Sensitive     IMIPENEM 1 SENSITIVE Sensitive     PIP/TAZO 8 SENSITIVE Sensitive     CEFEPIME 2 SENSITIVE Sensitive     * >=100,000 COLONIES/mL PSEUDOMONAS AERUGINOSA  Blood Culture (routine x 2)     Status: None (Preliminary result)   Collection Time: 06/30/22  1:50 PM   Specimen: Left Antecubital; Blood  Result Value Ref Range Status   Specimen Description LEFT ANTECUBITAL  Final   Special Requests   Final    BOTTLES DRAWN AEROBIC AND ANAEROBIC Blood Culture adequate volume   Culture   Final    NO GROWTH 4 DAYS Performed at [facility], [address], [place], [place] [address]    Report Status PENDING   Incomplete  Blood Culture (routine x 2)     Status: Abnormal   Collection Time: 06/30/22  1:51 PM   Specimen: BLOOD LEFT HAND  Result Value Ref Range Status   Specimen Description   Final    BLOOD LEFT HAND Performed at [facility], [address], [place], [place] [address]    Special Requests   Final    BOTTLES DRAWN AEROBIC AND ANAEROBIC Blood Culture results may not be optimal due to an inadequate volume of blood received in culture bottles Performed at [facility], [address], [place], [place] [address]    Culture  Setup Time   Final    GRAM POSITIVE COCCI IN CLUSTERS AEROBIC BOTTLE ONLY Gram Stain Report Called to,Read Back By and Verified With: EARLY W.  AT [id number] ON [id number] BY THOMPSON S. ANAEROBIC BOTTLE ONLY GRAM POSITIVE COCCI IN CLUSTERS CRITICAL VALUE NOTED.  VALUE IS CONSISTENT WITH PREVIOUSLY REPORTED AND CALLED VALUE. CRITICAL RESULT CALLED TO, READ BACK BY AND VERIFIED WITH: ANGEL RAY RN ON 07/01/22 @ 1817 BY DRT    Culture (A)  Final    STAPHYLOCOCCUS HOMINIS STAPHYLOCOCCUS EPIDERMIDIS THE SIGNIFICANCE OF ISOLATING THIS ORGANISM FROM A SINGLE SET OF BLOOD CULTURES WHEN MULTIPLE SETS ARE DRAWN IS UNCERTAIN. PLEASE NOTIFY THE MICROBIOLOGY DEPARTMENT WITHIN ONE WEEK IF SPECIATION AND SENSITIVITIES ARE REQUIRED. CRITICAL RESULT CALLED TO, READ BACK BY AND VERIFIED WITH: Everlene Balls [id number] FCP Performed at [facility], [address] [place], [place] [address]    Report Status 07/03/2022 FINAL  Final  Blood Culture ID Panel (Reflexed)     Status: Abnormal   Collection Time: 06/30/22  1:51 PM  Result Value Ref Range Status   Enterococcus faecalis NOT DETECTED NOT DETECTED Final   Enterococcus Faecium NOT DETECTED NOT DETECTED Final   Listeria monocytogenes NOT DETECTED NOT DETECTED Final   Staphylococcus species DETECTED (A) NOT DETECTED Final    Comment: CRITICAL RESULT CALLED TO, READ BACK BY AND VERIFIED WITH: ANGEL RAY RN ON 07/01/22 @ 1817 BY DRT     Staphylococcus aureus (BCID) NOT DETECTED NOT DETECTED Final   Staphylococcus epidermidis DETECTED (A) NOT DETECTED Final    Comment: Methicillin (oxacillin) resistant coagulase negative staphylococcus. Possible blood culture contaminant (unless isolated from more than one blood culture draw or clinical case suggests pathogenicity). No antibiotic treatment is indicated for blood  culture contaminants. CRITICAL RESULT CALLED TO, READ BACK BY AND VERIFIED WITH: ANGEL RAY RN ON 07/01/22 @ 1817 BY DRT    Staphylococcus lugdunensis NOT DETECTED NOT DETECTED Final   Streptococcus species NOT DETECTED NOT DETECTED Final   Streptococcus agalactiae NOT DETECTED NOT DETECTED Final   Streptococcus pneumoniae NOT DETECTED NOT DETECTED Final   Streptococcus pyogenes NOT DETECTED NOT DETECTED Final   A.calcoaceticus-baumannii NOT DETECTED NOT DETECTED Final   Bacteroides fragilis NOT DETECTED NOT DETECTED Final   Enterobacterales NOT DETECTED NOT DETECTED Final   Enterobacter cloacae complex NOT DETECTED NOT DETECTED Final   Escherichia coli NOT DETECTED NOT DETECTED Final   Klebsiella aerogenes NOT DETECTED NOT DETECTED Final   Klebsiella oxytoca NOT DETECTED NOT DETECTED Final   Klebsiella pneumoniae NOT DETECTED NOT DETECTED Final   Proteus species NOT DETECTED NOT DETECTED Final   Salmonella species NOT DETECTED NOT DETECTED Final   Serratia marcescens NOT DETECTED NOT DETECTED Final   Haemophilus influenzae NOT DETECTED NOT DETECTED Final   Neisseria meningitidis NOT DETECTED NOT DETECTED Final   Pseudomonas aeruginosa NOT DETECTED NOT DETECTED Final   Stenotrophomonas maltophilia NOT DETECTED NOT DETECTED Final   Candida albicans NOT DETECTED NOT DETECTED Final   Candida auris NOT DETECTED NOT DETECTED Final   Candida glabrata NOT DETECTED NOT DETECTED Final   Candida krusei NOT DETECTED NOT DETECTED Final   Candida parapsilosis NOT DETECTED NOT DETECTED Final   Candida tropicalis NOT DETECTED  NOT DETECTED Final   Cryptococcus neoformans/gattii NOT DETECTED NOT DETECTED Final   Methicillin resistance mecA/C DETECTED (A) NOT DETECTED Final    Comment: CRITICAL RESULT CALLED TO, READ BACK BY AND VERIFIED WITH: ANGEL RAY RN ON 07/01/22 @ 1817 BY DRT Performed at [facility], [address] [place], [place] [address]   MRSA Next Gen by PCR, Nasal     Status: None  Collection Time: 06/30/22  8:22 PM   Specimen: Nasal Mucosa; Nasal Swab  Result Value Ref Range Status   MRSA by PCR Next Gen NOT DETECTED NOT DETECTED Final    Comment: (NOTE) The GeneXpert MRSA Assay (FDA approved for NASAL specimens only), is one component of a comprehensive MRSA colonization surveillance program. It is not intended to diagnose MRSA infection nor to guide or monitor treatment for MRSA infections. Test performance is not FDA approved in patients less than 61 years old. Performed at [facility], [address], [place], [place] [address]   Culture, blood (Routine X 2) w Reflex to ID Panel     Status: None (Preliminary result)   Collection Time: 07/03/22  6:04 PM   Specimen: BLOOD LEFT HAND  Result Value Ref Range Status   Specimen Description BLOOD LEFT HAND  Final   Special Requests   Final    BOTTLES DRAWN AEROBIC ONLY Blood Culture adequate volume   Culture   Final    NO GROWTH < 12 HOURS Performed at [facility], [address] [place], [place] [address]    Report Status PENDING  Incomplete  Culture, blood (Routine X 2) w Reflex to ID Panel     Status: None (Preliminary result)   Collection Time: 07/04/22  5:39 AM   Specimen: BLOOD LEFT HAND  Result Value Ref Range Status   Specimen Description BLOOD LEFT HAND BOTTLES DRAWN AEROBIC ONLY  Final   Special Requests   Final    Blood Culture adequate volume Performed at [facility], [address] [place], [place] [address]    Culture PENDING  Incomplete   Report Status PENDING  Incomplete     Labs: BNP (last 3 results) Recent  Labs    04/20/22 0245 04/21/22 0114 04/22/22 0359  BNP 635.0* 361.2* 552.3*   Basic Metabolic Panel: Recent Labs  Lab 07/02/22 0514 07/03/22 0548 07/04/22 0539 07/05/22 0608 07/06/22 0520  NA 133* 131* 131* 132* 134*  K 4.2 4.3 4.9 4.2 4.4  CL 102 101 100 101 101  CO2 26 23 26 27 28   GLUCOSE 101* 138* 221* 136* 126*  BUN 23 20 22 22 21   CREATININE 0.76 0.74 0.89 0.82 0.74  CALCIUM 9.2 9.2 9.2 9.3 9.6  MG 1.7 1.7 1.8  --   --   PHOS 3.0 3.1 2.5  --   --    Liver Function Tests: Recent Labs  Lab 06/30/22 1350 07/02/22 0514 07/03/22 0548 07/04/22 0539 07/05/22 0608  AST 56* 24 19 16 15   ALT 68* 36 29 23 20   ALKPHOS 72 61 66 59 47  BILITOT 0.5 0.7 0.6 0.4 0.4  PROT 8.4* 7.0 7.5 7.3 6.9  ALBUMIN 2.4* 1.9* 2.1* 2.0* 2.6*   No results for input(s): "LIPASE", "AMYLASE" in the last 168 hours. No results for input(s): "AMMONIA" in the last 168 hours. CBC: Recent Labs  Lab 06/30/22 1350 07/01/22 0513 07/02/22 0514 07/03/22 0548 07/04/22 0539 07/05/22 0608 07/06/22 0520  WBC 9.0   < > 5.7 6.5 7.2 6.5 6.7  NEUTROABS 7.2  --   --   --   --   --   --   HGB 12.3*   < > 10.5* 11.9* 11.3* 9.0* 9.2*  HCT 39.7   < > 33.7* 37.6* 35.6* 29.4* 29.4*  MCV 86.7   < > 85.3 83.6 84.6 86.2 85.2  PLT 483*   < > 385 417* 378 325 332   < > =  values in this interval not displayed.   Cardiac Enzymes: No results for input(s): "CKTOTAL", "CKMB", "CKMBINDEX", "TROPONINI" in the last 168 hours. BNP: Invalid input(s): "POCBNP" CBG: Recent Labs  Lab 07/06/22 0437 07/06/22 0703 07/06/22 1111 07/06/22 1613 07/06/22 2010  GLUCAP 122* 143* 147* 155* 147*   D-Dimer No results for input(s): "DDIMER" in the last 72 hours. Hgb A1c No results for input(s): "HGBA1C" in the last 72 hours. Lipid Profile No results for input(s): "CHOL", "HDL", "LDLCALC", "TRIG", "CHOLHDL", "LDLDIRECT" in the last 72 hours. Thyroid function studies No results for input(s): "TSH", "T4TOTAL", "T3FREE",  "THYROIDAB" in the last 72 hours.  Invalid input(s): "FREET3" Anemia work up No results for input(s): "VITAMINB12", "FOLATE", "FERRITIN", "TIBC", "IRON", "RETICCTPCT" in the last 72 hours. Urinalysis    Component Value Date/Time   COLORURINE AMBER (A) 06/30/2022 1317   APPEARANCEUR CLOUDY (A) 06/30/2022 1317   APPEARANCEUR Cloudy 08/01/2014 1614   LABSPEC 1.015 06/30/2022 1317   LABSPEC 1.008 08/01/2014 1614   PHURINE 7.0 06/30/2022 1317   GLUCOSEU NEGATIVE 06/30/2022 1317   GLUCOSEU Negative 08/01/2014 1614   HGBUR SMALL (A) 06/30/2022 1317   BILIRUBINUR NEGATIVE 06/30/2022 1317   BILIRUBINUR Negative 08/01/2014 1614   KETONESUR NEGATIVE 06/30/2022 1317   PROTEINUR 30 (A) 06/30/2022 1317   UROBILINOGEN 0.2 07/01/2014 1809   NITRITE NEGATIVE 06/30/2022 1317   LEUKOCYTESUR LARGE (A) 06/30/2022 1317   LEUKOCYTESUR Negative 08/01/2014 1614   Sepsis Labs Recent Labs  Lab 07/03/22 0548 07/04/22 0539 07/05/22 0608 07/06/22 0520  WBC 6.5 7.2 6.5 6.7   Microbiology Recent Results (from the past 240 hour(s))  Resp Panel by RT-PCR (Flu A&B, Covid) Anterior Nasal Swab     Status: None   Collection Time: 06/30/22  1:17 PM   Specimen: Anterior Nasal Swab  Result Value Ref Range Status   SARS Coronavirus 2 by RT PCR NEGATIVE NEGATIVE Final    Comment: (NOTE) SARS-CoV-2 target nucleic acids are NOT DETECTED.  The SARS-CoV-2 RNA is generally detectable in upper respiratory specimens during the acute phase of infection. The lowest concentration of SARS-CoV-2 viral copies this assay can detect is 138 copies/mL. A negative result does not preclude SARS-Cov-2 infection and should not be used as the sole basis for treatment or other patient management decisions. A negative result may occur with  improper specimen collection/handling, submission of specimen other than nasopharyngeal swab, presence of viral mutation(s) within the areas targeted by this assay, and inadequate number of  viral copies(<138 copies/mL). A negative result must be combined with clinical observations, patient history, and epidemiological information. The expected result is Negative.  Fact Sheet for Patients:  BloggerCourse.com  Fact Sheet for Healthcare Providers:  SeriousBroker.it  This test is no t yet approved or cleared by the [place] FDA and  has been authorized for detection and/or diagnosis of SARS-CoV-2 by FDA under an Emergency Use Authorization (EUA). This EUA will remain  in effect (meaning this test can be used) for the duration of the COVID-19 declaration under Section 564(b)(1) of the Act, 21 U.S.C.section 360bbb-3(b)(1), unless the authorization is terminated  or revoked sooner.       Influenza A by PCR NEGATIVE NEGATIVE Final   Influenza B by PCR NEGATIVE NEGATIVE Final    Comment: (NOTE) The Xpert Xpress SARS-CoV-2/FLU/RSV plus assay is intended as an aid in the diagnosis of influenza from Nasopharyngeal swab specimens and should not be used as a sole basis for treatment. Nasal washings and aspirates are unacceptable for Xpert Xpress  SARS-CoV-2/FLU/RSV testing.  Fact Sheet for Patients: BloggerCourse.com  Fact Sheet for Healthcare Providers: SeriousBroker.it  This test is not yet approved or cleared by the [place] FDA and has been authorized for detection and/or diagnosis of SARS-CoV-2 by FDA under an Emergency Use Authorization (EUA). This EUA will remain in effect (meaning this test can be used) for the duration of the COVID-19 declaration under Section 564(b)(1) of the Act, 21 U.S.C. section 360bbb-3(b)(1), unless the authorization is terminated or revoked.  Performed at [facility], [address], [place], [place] [address]   Urine Culture     Status: Abnormal   Collection Time: 06/30/22  1:17 PM   Specimen: Urine, Suprapubic  Result Value Ref  Range Status   Specimen Description   Final    URINE, SUPRAPUBIC Performed at [facility], [address], [place], [place] [address]    Special Requests   Final    NONE Performed at [facility], [address], [place], [place] [address]    Culture (A)  Final    >=100,000 COLONIES/mL PSEUDOMONAS AERUGINOSA >=100,000 COLONIES/mL ACINETOBACTER BAUMANNII    Report Status 07/03/2022 FINAL  Final   Organism ID, Bacteria PSEUDOMONAS AERUGINOSA (A)  Final   Organism ID, Bacteria ACINETOBACTER BAUMANNII (A)  Final      Susceptibility   Acinetobacter baumannii - MIC*    CEFTAZIDIME 4 SENSITIVE Sensitive     CIPROFLOXACIN >=4 RESISTANT Resistant     GENTAMICIN 2 SENSITIVE Sensitive     IMIPENEM 8 INTERMEDIATE Intermediate     PIP/TAZO >=128 RESISTANT Resistant     TRIMETH/SULFA <=20 SENSITIVE Sensitive     AMPICILLIN/SULBACTAM 4 SENSITIVE Sensitive     * >=100,000 COLONIES/mL ACINETOBACTER BAUMANNII   Pseudomonas aeruginosa - MIC*    CEFTAZIDIME 2 SENSITIVE Sensitive     CIPROFLOXACIN 0.5 SENSITIVE Sensitive     GENTAMICIN <=1 SENSITIVE Sensitive     IMIPENEM 1 SENSITIVE Sensitive     PIP/TAZO 8 SENSITIVE Sensitive     CEFEPIME 2 SENSITIVE Sensitive     * >=100,000 COLONIES/mL PSEUDOMONAS AERUGINOSA  Blood Culture (routine x 2)     Status: None (Preliminary result)   Collection Time: 06/30/22  1:50 PM   Specimen: Left Antecubital; Blood  Result Value Ref Range Status   Specimen Description LEFT ANTECUBITAL  Final   Special Requests   Final    BOTTLES DRAWN AEROBIC AND ANAEROBIC Blood Culture adequate volume   Culture   Final    NO GROWTH 4 DAYS Performed at [facility], [address] [place], [place] [address]    Report Status PENDING  Incomplete  Blood Culture (routine x 2)     Status: Abnormal   Collection Time: 06/30/22  1:51 PM   Specimen: BLOOD LEFT HAND  Result Value Ref Range Status   Specimen Description   Final    BLOOD LEFT HAND Performed at [facility],  [address], [place], [place] [address]    Special Requests   Final    BOTTLES DRAWN AEROBIC AND ANAEROBIC Blood Culture results may not be optimal due to an inadequate volume of blood received in culture bottles Performed at [facility], [address], [place], [place] [address]    Culture  Setup Time   Final    GRAM POSITIVE COCCI IN CLUSTERS AEROBIC BOTTLE ONLY Gram Stain Report Called to,Read Back By and Verified With: EARLY W. AT [id number] ON [id number] BY THOMPSON S. ANAEROBIC BOTTLE ONLY GRAM POSITIVE COCCI IN CLUSTERS CRITICAL  VALUE NOTED.  VALUE IS CONSISTENT WITH PREVIOUSLY REPORTED AND CALLED VALUE. CRITICAL RESULT CALLED TO, READ BACK BY AND VERIFIED WITH: ANGEL RAY RN ON 07/01/22 @ 1817 BY DRT    Culture (A)  Final    STAPHYLOCOCCUS HOMINIS STAPHYLOCOCCUS EPIDERMIDIS THE SIGNIFICANCE OF ISOLATING THIS ORGANISM FROM A SINGLE SET OF BLOOD CULTURES WHEN MULTIPLE SETS ARE DRAWN IS UNCERTAIN. PLEASE NOTIFY THE MICROBIOLOGY DEPARTMENT WITHIN ONE WEEK IF SPECIATION AND SENSITIVITIES ARE REQUIRED. CRITICAL RESULT CALLED TO, READ BACK BY AND VERIFIED WITH: Everlene Balls [id number] FCP Performed at [facility], [address], [place], [place] [address]    Report Status 07/03/2022 FINAL  Final  Blood Culture ID Panel (Reflexed)     Status: Abnormal   Collection Time: 06/30/22  1:51 PM  Result Value Ref Range Status   Enterococcus faecalis NOT DETECTED NOT DETECTED Final   Enterococcus Faecium NOT DETECTED NOT DETECTED Final   Listeria monocytogenes NOT DETECTED NOT DETECTED Final   Staphylococcus species DETECTED (A) NOT DETECTED Final    Comment: CRITICAL RESULT CALLED TO, READ BACK BY AND VERIFIED WITH: ANGEL RAY RN ON 07/01/22 @ 1817 BY DRT    Staphylococcus aureus (BCID) NOT DETECTED NOT DETECTED Final   Staphylococcus epidermidis DETECTED (A) NOT DETECTED Final    Comment: Methicillin (oxacillin) resistant coagulase negative staphylococcus. Possible blood culture contaminant (unless  isolated from more than one blood culture draw or clinical case suggests pathogenicity). No antibiotic treatment is indicated for blood  culture contaminants. CRITICAL RESULT CALLED TO, READ BACK BY AND VERIFIED WITH: ANGEL RAY RN ON 07/01/22 @ 1817 BY DRT    Staphylococcus lugdunensis NOT DETECTED NOT DETECTED Final   Streptococcus species NOT DETECTED NOT DETECTED Final   Streptococcus agalactiae NOT DETECTED NOT DETECTED Final   Streptococcus pneumoniae NOT DETECTED NOT DETECTED Final   Streptococcus pyogenes NOT DETECTED NOT DETECTED Final   A.calcoaceticus-baumannii NOT DETECTED NOT DETECTED Final   Bacteroides fragilis NOT DETECTED NOT DETECTED Final   Enterobacterales NOT DETECTED NOT DETECTED Final   Enterobacter cloacae complex NOT DETECTED NOT DETECTED Final   Escherichia coli NOT DETECTED NOT DETECTED Final   Klebsiella aerogenes NOT DETECTED NOT DETECTED Final   Klebsiella oxytoca NOT DETECTED NOT DETECTED Final   Klebsiella pneumoniae NOT DETECTED NOT DETECTED Final   Proteus species NOT DETECTED NOT DETECTED Final   Salmonella species NOT DETECTED NOT DETECTED Final   Serratia marcescens NOT DETECTED NOT DETECTED Final   Haemophilus influenzae NOT DETECTED NOT DETECTED Final   Neisseria meningitidis NOT DETECTED NOT DETECTED Final   Pseudomonas aeruginosa NOT DETECTED NOT DETECTED Final   Stenotrophomonas maltophilia NOT DETECTED NOT DETECTED Final   Candida albicans NOT DETECTED NOT DETECTED Final   Candida auris NOT DETECTED NOT DETECTED Final   Candida glabrata NOT DETECTED NOT DETECTED Final   Candida krusei NOT DETECTED NOT DETECTED Final   Candida parapsilosis NOT DETECTED NOT DETECTED Final   Candida tropicalis NOT DETECTED NOT DETECTED Final   Cryptococcus neoformans/gattii NOT DETECTED NOT DETECTED Final   Methicillin resistance mecA/C DETECTED (A) NOT DETECTED Final    Comment: CRITICAL RESULT CALLED TO, READ BACK BY AND VERIFIED WITH: ANGEL RAY RN ON 07/01/22 @  1817 BY DRT Performed at [facility], [address] [place], [place] [address]   MRSA Next Gen by PCR, Nasal     Status: None   Collection Time: 06/30/22  8:22 PM   Specimen: Nasal Mucosa; Nasal Swab  Result  Value Ref Range Status   MRSA by PCR Next Gen NOT DETECTED NOT DETECTED Final    Comment: (NOTE) The GeneXpert MRSA Assay (FDA approved for NASAL specimens only), is one component of a comprehensive MRSA colonization surveillance program. It is not intended to diagnose MRSA infection nor to guide or monitor treatment for MRSA infections. Test performance is not FDA approved in patients less than 55 years old. Performed at [facility], [address], [place], [place] [address]   Culture, blood (Routine X 2) w Reflex to ID Panel     Status: None (Preliminary result)   Collection Time: 07/03/22  6:04 PM   Specimen: BLOOD LEFT HAND  Result Value Ref Range Status   Specimen Description BLOOD LEFT HAND  Final   Special Requests   Final    BOTTLES DRAWN AEROBIC ONLY Blood Culture adequate volume   Culture   Final    NO GROWTH < 12 HOURS Performed at [facility], [address] [place], [place] [address]    Report Status PENDING  Incomplete  Culture, blood (Routine X 2) w Reflex to ID Panel     Status: None (Preliminary result)   Collection Time: 07/04/22  5:39 AM   Specimen: BLOOD LEFT HAND  Result Value Ref Range Status   Specimen Description BLOOD LEFT HAND BOTTLES DRAWN AEROBIC ONLY  Final   Special Requests   Final    Blood Culture adequate volume Performed at [facility], [address] [place], [place] [address]    Culture PENDING  Incomplete   Report Status PENDING  Incomplete     Time coordinating discharge:  SIGNED:   Erick Blinks, MD  [facility] 07/06/2022, 8:31 PM   If 7PM-7AM, please contact night-coverage [email or web address]

## 2022-07-07 DIAGNOSIS — Z7189 Other specified counseling: Secondary | ICD-10-CM | POA: Diagnosis not present

## 2022-07-07 DIAGNOSIS — R519 Headache, unspecified: Secondary | ICD-10-CM | POA: Diagnosis not present

## 2022-07-07 DIAGNOSIS — E1140 Type 2 diabetes mellitus with diabetic neuropathy, unspecified: Secondary | ICD-10-CM | POA: Diagnosis not present

## 2022-07-07 DIAGNOSIS — U071 COVID-19: Secondary | ICD-10-CM | POA: Diagnosis not present

## 2022-07-07 DIAGNOSIS — A419 Sepsis, unspecified organism: Secondary | ICD-10-CM | POA: Diagnosis not present

## 2022-07-07 DIAGNOSIS — I10 Essential (primary) hypertension: Secondary | ICD-10-CM | POA: Diagnosis not present

## 2022-07-07 DIAGNOSIS — G2401 Drug induced subacute dyskinesia: Secondary | ICD-10-CM | POA: Diagnosis not present

## 2022-07-07 DIAGNOSIS — G478 Other sleep disorders: Secondary | ICD-10-CM | POA: Diagnosis not present

## 2022-07-07 DIAGNOSIS — F3132 Bipolar disorder, current episode depressed, moderate: Secondary | ICD-10-CM | POA: Diagnosis not present

## 2022-07-07 DIAGNOSIS — E11649 Type 2 diabetes mellitus with hypoglycemia without coma: Secondary | ICD-10-CM | POA: Diagnosis not present

## 2022-07-07 DIAGNOSIS — L89154 Pressure ulcer of sacral region, stage 4: Secondary | ICD-10-CM | POA: Diagnosis not present

## 2022-07-07 DIAGNOSIS — R296 Repeated falls: Secondary | ICD-10-CM | POA: Diagnosis not present

## 2022-07-07 DIAGNOSIS — R279 Unspecified lack of coordination: Secondary | ICD-10-CM | POA: Diagnosis not present

## 2022-07-07 DIAGNOSIS — T83511S Infection and inflammatory reaction due to indwelling urethral catheter, sequela: Secondary | ICD-10-CM | POA: Diagnosis not present

## 2022-07-07 DIAGNOSIS — R600 Localized edema: Secondary | ICD-10-CM | POA: Diagnosis not present

## 2022-07-07 DIAGNOSIS — I480 Paroxysmal atrial fibrillation: Secondary | ICD-10-CM | POA: Diagnosis not present

## 2022-07-07 DIAGNOSIS — Z743 Need for continuous supervision: Secondary | ICD-10-CM | POA: Diagnosis not present

## 2022-07-07 DIAGNOSIS — Z5181 Encounter for therapeutic drug level monitoring: Secondary | ICD-10-CM | POA: Diagnosis not present

## 2022-07-07 DIAGNOSIS — M869 Osteomyelitis, unspecified: Secondary | ICD-10-CM | POA: Diagnosis not present

## 2022-07-07 DIAGNOSIS — E119 Type 2 diabetes mellitus without complications: Secondary | ICD-10-CM | POA: Diagnosis not present

## 2022-07-07 DIAGNOSIS — N390 Urinary tract infection, site not specified: Secondary | ICD-10-CM | POA: Diagnosis not present

## 2022-07-07 DIAGNOSIS — R509 Fever, unspecified: Secondary | ICD-10-CM | POA: Diagnosis not present

## 2022-07-07 DIAGNOSIS — G9341 Metabolic encephalopathy: Secondary | ICD-10-CM | POA: Diagnosis not present

## 2022-07-07 DIAGNOSIS — M6281 Muscle weakness (generalized): Secondary | ICD-10-CM | POA: Diagnosis not present

## 2022-07-07 DIAGNOSIS — F312 Bipolar disorder, current episode manic severe with psychotic features: Secondary | ICD-10-CM | POA: Diagnosis not present

## 2022-07-07 DIAGNOSIS — R5381 Other malaise: Secondary | ICD-10-CM | POA: Diagnosis not present

## 2022-07-07 DIAGNOSIS — Z79899 Other long term (current) drug therapy: Secondary | ICD-10-CM | POA: Diagnosis not present

## 2022-07-07 LAB — CREATININE, SERUM
Creatinine, Ser: 0.77 mg/dL (ref 0.61–1.24)
GFR, Estimated: >60 mL/min (ref >60)

## 2022-07-07 LAB — GLUCOSE, CAPILLARY
Glucose-Capillary: 178 mg/dL — ABNORMAL HIGH (ref 70–99)
Glucose-Capillary: 151 mg/dL — ABNORMAL HIGH (ref 70–99)
Glucose-Capillary: 166 mg/dL — ABNORMAL HIGH (ref 70–99)

## 2022-07-07 NOTE — Progress Notes (Signed)
PROGRESS NOTE     Christian [name], is a 71 y.o. male, DOB - November 27, 1951, [id number]  Admit date - 06/30/2022   Admitting Physician Albertine Patricia, MD  Outpatient Primary MD for the patient is Caprice Renshaw, MD  LOS - 7  Chief Complaint  Patient presents with   Wound Infection        Brief Narrative:  Brief/Interim Summary:  Christian [name]  is a 71 y.o. male, with medical history significant for diabetes mellitus, CVA, bipolar disorder, coronary artery disease, PE and DVT, CKD 3, atrial fibrillation, OSA, gastrostomy tube and chronic Foley catheter, patient with multiple recent prolonged hospitalization, most recently with severe sepsis, due to strep constellatus bacteremia and sacral osteomyelitis, currently receiving Augmentin, patient was sent by facility with worsening wound infection, facility states that they are no longer able to provide appropriate medical care for this patient, and thus they transferred him back to the hospital, patient was tachycardic, and borderline febrile of 100 Fahrenheit on admission, he denies any chest pain, shortness of breath, headache. - in ED evaded lactic acid at 2.8, thrombocytosis at 483 K, meeting significant for worsening sacral pressure ulcer with osteomyelitis, he was tachycardic as well, sepsis protocol initiated and [facility] consulted to admit.  -Patient was discharged by Dr. Roderic Palau to SNF on 07/06/2022 however patient was not able to be picked up by transport until 07/07/2022 Patient seen and evaluated, chart reviewed, please see EMR for updated orders. Please see full discharge summary dictated by Dr Roderic Palau on 07/06/22.   Patient remains medically stable for discharge to SNF rehab today 07/07/22   -Assessment and Plan: Severe sepsis, present on admission - -Patient was discharged by Dr. Roderic Palau to SNF on 07/06/2022 however patient was not able to be picked up by transport until 07/07/2022 Patient seen and evaluated, chart reviewed, please  see EMR for updated orders.  Please see full discharge summary dictated by Dr Roderic Palau on 07/06/22.   Patient remains medically stable for discharge to SNF rehab today 07/07/22     Sacral pressure ulcer, stage IV, infected with underlining osteomyelitis -General surgery s/p bedside debridement was done on 8/3 -Wound care: Apply Santyl to the base of the wound.  Pack with damp Kerlix, making sure to pack into entire depth of wound bed, and cover with ABD pads, secure with Medipore tape. Change BID -Treated with IV antibiotics in hospital and discharged on oral Augmentin      Malfunction indwelling Foley catheter -Imaging showing balloon inflated in the urethra, Foley catheter was discontinued and new catheter inserted while in ED As per ID positive urine culture may be a colonization from old Foley - Please see full discharge summary dictated by Dr Roderic Palau on 07/06/22.   Pulmonary embolism -Anticoagulated with Xarelto   Type 2 diabetes mellitus -A1c most recently 7.6 on recent admission,  -Insulin regimen as per discharge med rec   Hypertension -Pressure was soft on admission but improved currently patient is hypertensive -BP medications as per discharge med rec   Disposition-- Please see full discharge summary dictated by Dr Roderic Palau on 07/06/22. -Discharge to SNF rehab  Code Status :  -  Code Status: Full Code   DVT Prophylaxis  :   -rivaroxaban (XARELTO) tablet 20 mg   Lab Results  Component Value Date   PLT 332 07/06/2022    Inpatient Medications  Scheduled Meds:  ARIPiprazole  10 mg Oral q morning   atorvastatin  10 mg Oral q1800   Chlorhexidine Gluconate  Cloth  6 each Topical Daily   collagenase   Topical BID   feeding supplement (PROSource TF20)  60 mL Per Tube BID   free water  150 mL Per Tube Q6H   insulin aspart  0-9 Units Subcutaneous Q4H   insulin glargine-yfgn  30 Units Subcutaneous Daily   liothyronine  10 mcg Oral Daily   metoprolol succinate  50 mg Oral Daily    multivitamin  15 mL Oral Daily   rivaroxaban  20 mg Oral Daily   senna-docusate  2 tablet Oral BID   sodium hypochlorite   Irrigation BID   tamsulosin  0.4 mg Oral Daily   Vitamin D (Ergocalciferol)  50,000 Units Oral Q7 days   Continuous Infusions:  feeding supplement (GLUCERNA 1.5 CAL) 50 mL/hr at 07/07/22 0957   piperacillin-tazobactam (ZOSYN)  IV 3.375 g (07/07/22 1213)   PRN Meds:.acetaminophen, hydrALAZINE **OR** hydrALAZINE, metoprolol tartrate, oxyCODONE   Anti-infectives (From admission, onward)    Start     Dose/Rate Route Frequency Ordered Stop   07/02/22 1400  piperacillin-tazobactam (ZOSYN) IVPB 3.375 g        3.375 g 12.5 mL/hr over 240 Minutes Intravenous Every 8 hours 07/02/22 1012     07/01/22 0200  vancomycin (VANCOREADY) IVPB 1750 mg/350 mL  Status:  Discontinued        1,750 mg 175 mL/hr over 120 Minutes Intravenous Every 12 hours 06/30/22 1446 06/30/22 1505   07/01/22 0200  vancomycin (VANCOREADY) IVPB 1250 mg/250 mL  Status:  Discontinued        1,250 mg 166.7 mL/hr over 90 Minutes Intravenous Every 12 hours 06/30/22 1505 07/02/22 1012   06/30/22 2200  ceFEPIme (MAXIPIME) 2 g in sodium chloride 0.9 % 100 mL IVPB  Status:  Discontinued        2 g 200 mL/hr over 30 Minutes Intravenous Every 8 hours 06/30/22 1923 07/02/22 1012   06/30/22 1330  ceFEPIme (MAXIPIME) 2 g in sodium chloride 0.9 % 100 mL IVPB        2 g 200 mL/hr over 30 Minutes Intravenous  Once 06/30/22 1317 06/30/22 1507   06/30/22 1330  vancomycin (VANCOREADY) IVPB 2000 mg/400 mL        2,000 mg 200 mL/hr over 120 Minutes Intravenous  Once 06/30/22 1324 06/30/22 1727         Subjective: Christian [name] today has no fevers, no emesis,  No chest pain,    -Patient was discharged by Dr. Roderic Palau to SNF on 07/06/2022 however patient was not able to be picked up by transport until 07/07/2022 Patient seen and evaluated, chart reviewed, please see EMR for updated orders.  Please see full discharge  summary dictated by Dr Roderic Palau on 07/06/22.   Patient remains medically stable for discharge to SNF rehab today 07/07/22  Objective: Vitals:   07/06/22 0542 07/06/22 1346 07/06/22 2027 07/07/22 0357  BP: 129/88 126/81 128/77 (!) 134/91  Pulse: (!) 107 100 100 (!) 103  Resp: 19 18 19 19   Temp: 98.8 F (37.1 C) 98.3 F (36.8 C) (!) 97.5 F (36.4 C) 98 F (36.7 C)  TempSrc: Oral Oral Oral Oral  SpO2: 99% 98% 99% 97%  Weight:      Height:        Intake/Output Summary (Last 24 hours) at 07/07/2022 1214 Last data filed at 07/07/2022 0957 Gross per 24 hour  Intake 1658.48 ml  Output 3900 ml  Net -2241.52 ml   Filed Weights   06/30/22 1959 07/01/22 [id number]  07/05/22 0507  Weight: 100.7 kg 102.2 kg 134.7 kg    Physical Exam  Gen-Resting comfortably no acute distress, awake and alert CV-S1, S2, RRR Resp-clear, fair air movement bilaterally Abd-benign exam, nontender, positive bowel sounds Extre-improved edema  Please see full discharge summary dictated by Dr Kerry Hough on 07/06/22.   Data Reviewed: I have personally reviewed following labs and imaging studies  CBC: Recent Labs  Lab 06/30/22 1350 07/01/22 0513 07/02/22 0514 07/03/22 0548 07/04/22 0539 07/05/22 0608 07/06/22 0520  WBC 9.0   < > 5.7 6.5 7.2 6.5 6.7  NEUTROABS 7.2  --   --   --   --   --   --   HGB 12.3*   < > 10.5* 11.9* 11.3* 9.0* 9.2*  HCT 39.7   < > 33.7* 37.6* 35.6* 29.4* 29.4*  MCV 86.7   < > 85.3 83.6 84.6 86.2 85.2  PLT 483*   < > 385 417* 378 325 332   < > = values in this interval not displayed.   Basic Metabolic Panel: Recent Labs  Lab 07/02/22 0514 07/03/22 0548 07/04/22 0539 07/05/22 0608 07/06/22 0520 07/07/22 0554  NA 133* 131* 131* 132* 134*  --   K 4.2 4.3 4.9 4.2 4.4  --   CL 102 101 100 101 101  --   CO2 26 23 26 27 28   --   GLUCOSE 101* 138* 221* 136* 126*  --   BUN 23 20 22 22 21   --   CREATININE 0.76 0.74 0.89 0.82 0.74 0.77  CALCIUM 9.2 9.2 9.2 9.3 9.6  --   MG 1.7 1.7 1.8  --   --    --   PHOS 3.0 3.1 2.5  --   --   --    GFR: Estimated Creatinine Clearance: 125.5 mL/min (by C-G formula based on SCr of 0.77 mg/dL). Liver Function Tests: Recent Labs  Lab 06/30/22 1350 07/02/22 0514 07/03/22 0548 07/04/22 0539 07/05/22 0608  AST 56* 24 19 16 15   ALT 68* 36 29 23 20   ALKPHOS 72 61 66 59 47  BILITOT 0.5 0.7 0.6 0.4 0.4  PROT 8.4* 7.0 7.5 7.3 6.9  ALBUMIN 2.4* 1.9* 2.1* 2.0* 2.6*   Cardiac Enzymes: No results for input(s): "CKTOTAL", "CKMB", "CKMBINDEX", "TROPONINI" in the last 168 hours. BNP (last 3 results) No results for input(s): "PROBNP" in the last 8760 hours. HbA1C: No results for input(s): "HGBA1C" in the last 72 hours. Sepsis Labs: @LABRCNTIP (procalcitonin:4,lacticidven:4) ) Recent Results (from the past 240 hour(s))  Resp Panel by RT-PCR (Flu A&B, Covid) Anterior Nasal Swab     Status: None   Collection Time: 06/30/22  1:17 PM   Specimen: Anterior Nasal Swab  Result Value Ref Range Status   SARS Coronavirus 2 by RT PCR NEGATIVE NEGATIVE Final    Comment: (NOTE) SARS-CoV-2 target nucleic acids are NOT DETECTED.  The SARS-CoV-2 RNA is generally detectable in upper respiratory specimens during the acute phase of infection. The lowest concentration of SARS-CoV-2 viral copies this assay can detect is 138 copies/mL. A negative result does not preclude SARS-Cov-2 infection and should not be used as the sole basis for treatment or other patient management decisions. A negative result may occur with  improper specimen collection/handling, submission of specimen other than nasopharyngeal swab, presence of viral mutation(s) within the areas targeted by this assay, and inadequate number of viral copies(<138 copies/mL). A negative result must be combined with clinical observations, patient history, and epidemiological information. The expected  result is Negative.  Fact Sheet for Patients:  EntrepreneurPulse.com.au  Fact Sheet for  Healthcare Providers:  IncredibleEmployment.be  This test is no t yet approved or cleared by the [place] FDA and  has been authorized for detection and/or diagnosis of SARS-CoV-2 by FDA under an Emergency Use Authorization (EUA). This EUA will remain  in effect (meaning this test can be used) for the duration of the COVID-19 declaration under Section 564(b)(1) of the Act, 21 U.S.C.section 360bbb-3(b)(1), unless the authorization is terminated  or revoked sooner.       Influenza A by PCR NEGATIVE NEGATIVE Final   Influenza B by PCR NEGATIVE NEGATIVE Final    Comment: (NOTE) The Xpert Xpress SARS-CoV-2/FLU/RSV plus assay is intended as an aid in the diagnosis of influenza from Nasopharyngeal swab specimens and should not be used as a sole basis for treatment. Nasal washings and aspirates are unacceptable for Xpert Xpress SARS-CoV-2/FLU/RSV testing.  Fact Sheet for Patients: EntrepreneurPulse.com.au  Fact Sheet for Healthcare Providers: IncredibleEmployment.be  This test is not yet approved or cleared by the [place] FDA and has been authorized for detection and/or diagnosis of SARS-CoV-2 by FDA under an Emergency Use Authorization (EUA). This EUA will remain in effect (meaning this test can be used) for the duration of the COVID-19 declaration under Section 564(b)(1) of the Act, 21 U.S.C. section 360bbb-3(b)(1), unless the authorization is terminated or revoked.  Performed at [facility], [address], [place], [place] [address]   Urine Culture     Status: Abnormal   Collection Time: 06/30/22  1:17 PM   Specimen: Urine, Suprapubic  Result Value Ref Range Status   Specimen Description   Final    URINE, SUPRAPUBIC Performed at [facility], [address], [place], [place] [address]    Special Requests   Final    NONE Performed at [facility], [address], [place], [place] [address]    Culture (A)  Final     >=100,000 COLONIES/mL PSEUDOMONAS AERUGINOSA >=100,000 COLONIES/mL ACINETOBACTER BAUMANNII    Report Status 07/03/2022 FINAL  Final   Organism ID, Bacteria PSEUDOMONAS AERUGINOSA (A)  Final   Organism ID, Bacteria ACINETOBACTER BAUMANNII (A)  Final      Susceptibility   Acinetobacter baumannii - MIC*    CEFTAZIDIME 4 SENSITIVE Sensitive     CIPROFLOXACIN >=4 RESISTANT Resistant     GENTAMICIN 2 SENSITIVE Sensitive     IMIPENEM 8 INTERMEDIATE Intermediate     PIP/TAZO >=128 RESISTANT Resistant     TRIMETH/SULFA <=20 SENSITIVE Sensitive     AMPICILLIN/SULBACTAM 4 SENSITIVE Sensitive     * >=100,000 COLONIES/mL ACINETOBACTER BAUMANNII   Pseudomonas aeruginosa - MIC*    CEFTAZIDIME 2 SENSITIVE Sensitive     CIPROFLOXACIN 0.5 SENSITIVE Sensitive     GENTAMICIN <=1 SENSITIVE Sensitive     IMIPENEM 1 SENSITIVE Sensitive     PIP/TAZO 8 SENSITIVE Sensitive     CEFEPIME 2 SENSITIVE Sensitive     * >=100,000 COLONIES/mL PSEUDOMONAS AERUGINOSA  Blood Culture (routine x 2)     Status: None   Collection Time: 06/30/22  1:50 PM   Specimen: Left Antecubital; Blood  Result Value Ref Range Status   Specimen Description LEFT ANTECUBITAL  Final   Special Requests   Final    BOTTLES DRAWN AEROBIC AND ANAEROBIC Blood Culture adequate volume   Culture   Final    NO GROWTH 6 DAYS Performed at [facility], [address], [place], [place] [address]  Report Status 07/06/2022 FINAL  Final  Blood Culture (routine x 2)     Status: Abnormal   Collection Time: 06/30/22  1:51 PM   Specimen: BLOOD LEFT HAND  Result Value Ref Range Status   Specimen Description   Final    BLOOD LEFT HAND Performed at [facility], [address], [place], [place] [address]    Special Requests   Final    BOTTLES DRAWN AEROBIC AND ANAEROBIC Blood Culture results may not be optimal due to an inadequate volume of blood received in culture bottles Performed at [facility], [address], [place], [place] [address]     Culture  Setup Time   Final    GRAM POSITIVE COCCI IN CLUSTERS AEROBIC BOTTLE ONLY Gram Stain Report Called to,Read Back By and Verified With: EARLY W. AT [id number] ON [id number] BY THOMPSON S. ANAEROBIC BOTTLE ONLY GRAM POSITIVE COCCI IN CLUSTERS CRITICAL VALUE NOTED.  VALUE IS CONSISTENT WITH PREVIOUSLY REPORTED AND CALLED VALUE. CRITICAL RESULT CALLED TO, READ BACK BY AND VERIFIED WITH: ANGEL RAY RN ON 07/01/22 @ 1817 BY DRT    Culture (A)  Final    STAPHYLOCOCCUS HOMINIS STAPHYLOCOCCUS EPIDERMIDIS THE SIGNIFICANCE OF ISOLATING THIS ORGANISM FROM A SINGLE SET OF BLOOD CULTURES WHEN MULTIPLE SETS ARE DRAWN IS UNCERTAIN. PLEASE NOTIFY THE MICROBIOLOGY DEPARTMENT WITHIN ONE WEEK IF SPECIATION AND SENSITIVITIES ARE REQUIRED. CRITICAL RESULT CALLED TO, READ BACK BY AND VERIFIED WITH: Doylene Canning [id number] FCP Performed at [facility], [place] [address] [place], [place] [address]    Report Status 07/03/2022 FINAL  Final  Blood Culture ID Panel (Reflexed)     Status: Abnormal   Collection Time: 06/30/22  1:51 PM  Result Value Ref Range Status   Enterococcus faecalis NOT DETECTED NOT DETECTED Final   Enterococcus Faecium NOT DETECTED NOT DETECTED Final   Listeria monocytogenes NOT DETECTED NOT DETECTED Final   Staphylococcus species DETECTED (A) NOT DETECTED Final    Comment: CRITICAL RESULT CALLED TO, READ BACK BY AND VERIFIED WITH: ANGEL RAY RN ON 07/01/22 @ 1817 BY DRT    Staphylococcus aureus (BCID) NOT DETECTED NOT DETECTED Final   Staphylococcus epidermidis DETECTED (A) NOT DETECTED Final    Comment: Methicillin (oxacillin) resistant coagulase negative staphylococcus. Possible blood culture contaminant (unless isolated from more than one blood culture draw or clinical case suggests pathogenicity). No antibiotic treatment is indicated for blood  culture contaminants. CRITICAL RESULT CALLED TO, READ BACK BY AND VERIFIED WITH: ANGEL RAY RN ON 07/01/22 @ 1817 BY DRT    Staphylococcus lugdunensis  NOT DETECTED NOT DETECTED Final   Streptococcus species NOT DETECTED NOT DETECTED Final   Streptococcus agalactiae NOT DETECTED NOT DETECTED Final   Streptococcus pneumoniae NOT DETECTED NOT DETECTED Final   Streptococcus pyogenes NOT DETECTED NOT DETECTED Final   A.calcoaceticus-baumannii NOT DETECTED NOT DETECTED Final   Bacteroides fragilis NOT DETECTED NOT DETECTED Final   Enterobacterales NOT DETECTED NOT DETECTED Final   Enterobacter cloacae complex NOT DETECTED NOT DETECTED Final   Escherichia coli NOT DETECTED NOT DETECTED Final   Klebsiella aerogenes NOT DETECTED NOT DETECTED Final   Klebsiella oxytoca NOT DETECTED NOT DETECTED Final   Klebsiella pneumoniae NOT DETECTED NOT DETECTED Final   Proteus species NOT DETECTED NOT DETECTED Final   Salmonella species NOT DETECTED NOT DETECTED Final   Serratia marcescens NOT DETECTED NOT DETECTED Final   Haemophilus influenzae NOT DETECTED NOT DETECTED Final   Neisseria meningitidis NOT DETECTED NOT DETECTED Final  Pseudomonas aeruginosa NOT DETECTED NOT DETECTED Final   Stenotrophomonas maltophilia NOT DETECTED NOT DETECTED Final   Candida albicans NOT DETECTED NOT DETECTED Final   Candida auris NOT DETECTED NOT DETECTED Final   Candida glabrata NOT DETECTED NOT DETECTED Final   Candida krusei NOT DETECTED NOT DETECTED Final   Candida parapsilosis NOT DETECTED NOT DETECTED Final   Candida tropicalis NOT DETECTED NOT DETECTED Final   Cryptococcus neoformans/gattii NOT DETECTED NOT DETECTED Final   Methicillin resistance mecA/C DETECTED (A) NOT DETECTED Final    Comment: CRITICAL RESULT CALLED TO, READ BACK BY AND VERIFIED WITH: ANGEL RAY RN ON 07/01/22 @ 1817 BY DRT Performed at [facility], [address], [place], [place] [address]   MRSA Next Gen by PCR, Nasal     Status: None   Collection Time: 06/30/22  8:22 PM   Specimen: Nasal Mucosa; Nasal Swab  Result Value Ref Range Status   MRSA by PCR Next Gen NOT DETECTED NOT  DETECTED Final    Comment: (NOTE) The GeneXpert MRSA Assay (FDA approved for NASAL specimens only), is one component of a comprehensive MRSA colonization surveillance program. It is not intended to diagnose MRSA infection nor to guide or monitor treatment for MRSA infections. Test performance is not FDA approved in patients less than 81 years old. Performed at [facility], [address], [place], [place] [address]   Culture, blood (Routine X 2) w Reflex to ID Panel     Status: None (Preliminary result)   Collection Time: 07/03/22  6:04 PM   Specimen: BLOOD LEFT HAND  Result Value Ref Range Status   Specimen Description BLOOD LEFT HAND  Final   Special Requests   Final    BOTTLES DRAWN AEROBIC ONLY Blood Culture adequate volume   Culture   Final    NO GROWTH 4 DAYS Performed at [facility], [address], [place], [place] [address]    Report Status PENDING  Incomplete  Culture, blood (Routine X 2) w Reflex to ID Panel     Status: None (Preliminary result)   Collection Time: 07/04/22  5:39 AM   Specimen: BLOOD LEFT HAND  Result Value Ref Range Status   Specimen Description BLOOD LEFT HAND BOTTLES DRAWN AEROBIC ONLY  Final   Special Requests Blood Culture adequate volume  Final   Culture   Final    NO GROWTH 3 DAYS Performed at [facility], [address] [place], [place] [address]    Report Status PENDING  Incomplete      Radiology Studies: No results found.   Scheduled Meds:  ARIPiprazole  10 mg Oral q morning   atorvastatin  10 mg Oral q1800   Chlorhexidine Gluconate Cloth  6 each Topical Daily   collagenase   Topical BID   feeding supplement (PROSource TF20)  60 mL Per Tube BID   free water  150 mL Per Tube Q6H   insulin aspart  0-9 Units Subcutaneous Q4H   insulin glargine-yfgn  30 Units Subcutaneous Daily   liothyronine  10 mcg Oral Daily   metoprolol succinate  50 mg Oral Daily   multivitamin  15 mL Oral Daily   rivaroxaban  20 mg Oral Daily   senna-docusate   2 tablet Oral BID   sodium hypochlorite   Irrigation BID   tamsulosin  0.4 mg Oral Daily   Vitamin D (Ergocalciferol)  50,000 Units Oral Q7 days   Continuous Infusions:  feeding supplement (GLUCERNA 1.5 CAL) 50 mL/hr  at 07/07/22 0957   piperacillin-tazobactam (ZOSYN)  IV 3.375 g (07/07/22 1213)     LOS: 7 days    Roxan Hockey M.D on 07/07/2022 at 12:14 PM  Go to [email or web address] - for contact info  [facility] - Office  [phone]  If 7PM-7AM, please contact night-coverage [email or web address] 07/07/2022, 12:14 PM

## 2022-07-07 NOTE — TOC Transition Note (Signed)
Transition of Care [facility]) - CM/SW Discharge Note   Patient Details  Name: Christian [name] MRN: [id number] Date of Birth: Dec 21, 1950  [facility] CM/SW Contact:  Karn Cassis, LCSW Phone Number: 07/07/2022, 2:02 PM   Clinical Narrative:  Pt d/c today back to [facility]. D/C summary sent to SNF. Pt's daughter/legal guardian, [name] notified. Pt will transport via [facility]. Admissions aware of return and authorization received. RN given number to call report.       Final next level of care: Skilled Nursing Facility Barriers to Discharge: Barriers Resolved   Patient Goals and CMS Choice        Discharge Placement                Patient to be transferred to facility by: [facility] Name of family member notified: daughter- [name] Patient and family notified of of transfer: 07/07/22  Discharge Plan and Services                                     Social Determinants of Health (SDOH) Interventions     Readmission Risk Interventions    05/28/2022   12:57 PM 05/14/2022   12:39 PM 04/13/2022    3:06 PM  Readmission Risk Prevention Plan  Transportation Screening Complete Complete   PCP or Specialist Appt within 3-5 Days Complete  Complete  HRI or Home Care Consult Complete  Complete  Social Work Consult for Recovery Care Planning/Counseling   Complete  Palliative Care Screening Not Complete  Not Applicable  Medication Review [employer]) Complete Complete Referral to Pharmacy  PCP or Specialist appointment within 3-5 days of discharge  Complete   HRI or Home Care Consult  Complete   Palliative Care Screening  Not Applicable   Skilled Nursing Facility  Complete

## 2022-07-07 NOTE — [place] FL2 (Signed)
[place] MEDICAID FL2 LEVEL OF CARE SCREENING TOOL     IDENTIFICATION  Patient Name: Christian [name] Birthdate: October 08, 1951 Sex: male Admission Date (Current Location): 06/30/2022  [facility] and [place] Number:  [employer] and Address:  [facility],  [address] [place] [address]      Provider Number: [id number]  Attending Physician Name and Address:  Shon Hale, MD  Relative Name and Phone Number:  [name] (Legal Guardian)   [phone]    Current Level of Care: Hospital Recommended Level of Care: Skilled Nursing Facility Prior Approval Number:    Date Approved/Denied:   PASRR Number:    Discharge Plan: SNF    Current Diagnoses: Patient Active Problem List   Diagnosis Date Noted   Sepsis (HCC) 06/30/2022   Osteomyelitis (HCC)    Severe sepsis (HCC) 05/27/2022   Sacral decubitus ulcer, stage IV (HCC) 05/27/2022   Hypoglycemia 03/12/2022   Cellulitis in diabetic foot (HCC) 01/26/2022   Diabetic ulcer of ankle (HCC) 01/26/2022   Severe sepsis without septic shock (HCC) 04/02/2021   Lactic acidosis 04/02/2021   Hyponatremia 04/02/2021   Hyperkalemia 04/02/2021   Chronic kidney disease, stage 3b (HCC) 04/02/2021   Fever 04/02/2021   Septic shock (HCC) 04/02/2021   Altered mental status    AKI (acute kidney injury) (HCC)    CVA (cerebral vascular accident) (HCC) 10/07/2020   Type 2 diabetes mellitus with stage 3 chronic kidney disease (HCC) 04/01/2020   CAD (coronary artery disease), native coronary artery 04/01/2020   NSTEMI (non-ST elevated myocardial infarction) (HCC) 03/31/2020   Obesity, Class III, BMI 40-49.9 (morbid obesity) (HCC) 03/31/2020   Aspiration pneumonia of both lower lobes due to gastric secretions (HCC) 03/21/2020   Sacral decubitus ulcer, stage II (HCC) 03/19/2020   Chronic diastolic CHF (congestive heart failure) (HCC) 03/17/2020   Atrial flutter, paroxysmal (HCC) 12/27/2019   Constipation 09/16/2018    Bipolar 1 disorder, depressed (HCC) 08/22/2018   Encephalopathy    Paroxysmal atrial fibrillation (HCC) 06/18/2018   History of pulmonary embolism 06/17/2018   Generalized weakness 03/27/2018   Diabetes mellitus type 2 in nonobese (HCC) 03/27/2018   Pulmonary embolism (HCC) 03/27/2018   Fall    Laceration of eyebrow    Bipolar I disorder, most recent episode depressed, severe w psychosis (HCC) 12/22/2015   Bipolar affective disorder, depressed, severe, with psychotic behavior (HCC)    Bipolar I disorder, current or most recent episode depressed, with psychotic features (HCC)    Severe bipolar I disorder with depression (HCC)    Bipolar I disorder, most recent episode depressed, severe with psychotic features (HCC)    Overdose 09/11/2015   Acute respiratory failure with hypoxia (HCC) 09/05/2015   Elevated troponin 09/05/2015   Somnolence 09/05/2015   Bipolar depression (HCC) 09/05/2015   Acute bilateral deep vein thrombosis (DVT) of femoral veins (HCC) 09/05/2015   Bilateral pulmonary embolism (HCC) 09/02/2015   Labile hypertension    Pulmonary embolism with acute cor pulmonale (HCC)    Dyspnea    Orthostatic hypotension    Bipolar disorder, in partial remission, most recent episode depressed (HCC)    Syncope, near 08/31/2015   Bipolar and related disorder (HCC)    Bipolar affective disorder, depressed, mild (HCC) 08/28/2015   Pressure ulcer 07/30/2015   Protein-calorie malnutrition, severe (HCC) 07/30/2015   Dehydration 07/29/2015   Diabetes (HCC) 07/09/2015   Urinary tract infection associated with catheterization of urinary tract (HCC) 06/19/2014   Bacteremia 06/19/2014   Bipolar affective  disorder, current episode manic with psychotic symptoms (HCC) 06/17/2014   Type 2 diabetes mellitus with complication, without long-term current use of insulin (HCC) 06/02/2014   Other and unspecified hyperlipidemia 06/02/2014   OSA on CPAP 06/02/2014   Bilateral lower extremity edema:  chronic with venous stasis changes 06/02/2014   Gout 06/02/2014   Hypertension     Orientation RESPIRATION BLADDER Height & Weight     Self, Situation  Normal Indwelling catheter Weight: 297 lb (134.7 kg) Height:  6' (182.9 cm)  BEHAVIORAL SYMPTOMS/MOOD NEUROLOGICAL BOWEL NUTRITION STATUS      Incontinent Feeding tube, Diet (Tube feeding, dysphagia 1 diet with thin liquids)  AMBULATORY STATUS COMMUNICATION OF NEEDS Skin   Total Care Verbally PU Stage and Appropriate Care (sacral wound)                       Personal Care Assistance Level of Assistance  Bathing, Feeding, Dressing Bathing Assistance: Maximum assistance Feeding assistance: Limited assistance Dressing Assistance: Maximum assistance     Functional Limitations Info  Sight, Hearing, Speech Sight Info: Adequate Hearing Info: Adequate Speech Info: Adequate    SPECIAL CARE FACTORS FREQUENCY                       Contractures Contractures Info: Present    Additional Factors Info  Code Status, Allergies, Psychotropic, Isolation Precautions Code Status Info: Full Code Allergies Info: Amlodipine, onglyza, zyprexa, ritalin Psychotropic Info: abilify, cogentin   Isolation Precautions Info: contact precautions VRE 06/23/22  MDRO 06/30/22     Current Medications (07/07/2022):  This is the current hospital active medication list Current Facility-Administered Medications  Medication Dose Route Frequency Provider Last Rate Last Admin   acetaminophen (TYLENOL) tablet 650 mg  650 mg Per Tube Q6H PRN Elgergawy, Leana Roe, MD   650 mg at 07/05/22 1856   ARIPiprazole (ABILIFY) tablet 10 mg  10 mg Oral q morning Elgergawy, Leana Roe, MD   10 mg at 07/07/22 0758   atorvastatin (LIPITOR) tablet 10 mg  10 mg Oral q1800 Elgergawy, Leana Roe, MD   10 mg at 07/06/22 1751   Chlorhexidine Gluconate Cloth 2 % PADS 6 each  6 each Topical Daily Elgergawy, Leana Roe, MD   6 each at 07/07/22 0749   collagenase (SANTYL) ointment    Topical BID Pappayliou, Catherine A, DO   Given at 07/07/22 0750   feeding supplement (GLUCERNA 1.5 CAL) liquid 1,000 mL  1,000 mL Per Tube Continuous Elgergawy, Leana Roe, MD 50 mL/hr at 07/07/22 0054 Infusion Verify at 07/07/22 0054   feeding supplement (PROSource TF20) liquid 60 mL  60 mL Per Tube BID Erick Blinks, MD   60 mL at 07/07/22 0756   free water 150 mL  150 mL Per Tube Q6H Elgergawy, Leana Roe, MD   150 mL at 07/07/22 0531   hydrALAZINE (APRESOLINE) tablet 50 mg  50 mg Oral Q6H PRN Gillis Santa, MD       Or   hydrALAZINE (APRESOLINE) injection 10 mg  10 mg Intravenous Q6H PRN Gillis Santa, MD       insulin aspart (novoLOG) injection 0-9 Units  0-9 Units Subcutaneous Q4H Elgergawy, Leana Roe, MD   2 Units at 07/07/22 0757   insulin glargine-yfgn (SEMGLEE) injection 30 Units  30 Units Subcutaneous Daily Elgergawy, Leana Roe, MD   30 Units at 07/07/22 0757   liothyronine (CYTOMEL) tablet 10 mcg  10 mcg Oral Daily Elgergawy, Leana Roe, MD  10 mcg at 07/07/22 0759   metoprolol succinate (TOPROL-XL) 24 hr tablet 50 mg  50 mg Oral Daily Elgergawy, Silver Huguenin, MD   50 mg at 07/07/22 0758   metoprolol tartrate (LOPRESSOR) injection 5 mg  5 mg Intravenous Q6H PRN Val Riles, MD   5 mg at 07/04/22 1825   multivitamin liquid 15 mL  15 mL Oral Daily Val Riles, MD   15 mL at 07/07/22 0756   oxyCODONE (Oxy IR/ROXICODONE) immediate release tablet 5 mg  5 mg Oral Q6H PRN Zierle-Ghosh, Asia B, DO   5 mg at 07/07/22 0125   piperacillin-tazobactam (ZOSYN) IVPB 3.375 g  3.375 g Intravenous Q8H Mignon Pine, DO 12.5 mL/hr at 07/07/22 0531 3.375 g at 07/07/22 0531   rivaroxaban (XARELTO) tablet 20 mg  20 mg Oral Daily Val Riles, MD   20 mg at 07/07/22 0758   senna-docusate (Senokot-S) tablet 2 tablet  2 tablet Oral BID Elgergawy, Silver Huguenin, MD   2 tablet at 07/06/22 0853   sodium hypochlorite (DAKIN'S 1/4 STRENGTH) topical solution   Irrigation BID Val Riles, MD   Given at 07/07/22 0759    tamsulosin (FLOMAX) capsule 0.4 mg  0.4 mg Oral Daily Elgergawy, Silver Huguenin, MD   0.4 mg at 07/07/22 [id number]   Vitamin D (Ergocalciferol) (DRISDOL) 1.25 MG (50000 UNIT) capsule 50,000 Units  50,000 Units Oral Q7 days Elgergawy, Silver Huguenin, MD         Discharge Medications: Please see discharge summary for a list of discharge medications.  Relevant Imaging Results:  Relevant Lab Results:   Additional Information SSN: [id number], [place], [place]

## 2022-07-07 NOTE — Progress Notes (Signed)
Patient seen and evaluated, chart reviewed, please see EMR for updated orders. Please see full discharge summary dictated by Dr Kerry Hough on 07/06/22.  Patient remains medically stable for discharge to SNF rehab today 07/07/22  Shon Hale, MD

## 2022-07-08 ENCOUNTER — Other Ambulatory Visit: Payer: Self-pay

## 2022-07-08 LAB — CULTURE, BLOOD (ROUTINE X 2)
Culture: NO GROWTH
Special Requests: ADEQUATE

## 2022-07-08 NOTE — Patient Outreach (Signed)
[facility]) Care Management  07/08/2022  Taeshawn Helfman Kropf 10/08/1951 [id number]  [facility] [[facility]]  Accountable Care Organization [ACO] Patient: [name]  Primary Care Provider:  Charlynne Pander, MD is not listed on the [facility] roster as an in [facility]. Called to number for provider and is listed with [facility] a skilled nursing facility  *Remote coverage review for high risk for unplanned readmission less than 30 days readmission screen  EMR Review reveals patient is LTC at [facility]  Will sign off, no [facility] care management needs as patient is LTC      Reason:  primary care provider is not an in network provider at this time.  Charlesetta Shanks, RN BSN CCM [facility]  [phone] business mobile phone Toll free office [phone]  Fax number: [phone] [email or web address]

## 2022-07-09 DIAGNOSIS — L89154 Pressure ulcer of sacral region, stage 4: Secondary | ICD-10-CM | POA: Diagnosis not present

## 2022-07-09 DIAGNOSIS — E119 Type 2 diabetes mellitus without complications: Secondary | ICD-10-CM | POA: Diagnosis not present

## 2022-07-09 DIAGNOSIS — A419 Sepsis, unspecified organism: Secondary | ICD-10-CM | POA: Diagnosis not present

## 2022-07-09 DIAGNOSIS — M869 Osteomyelitis, unspecified: Secondary | ICD-10-CM | POA: Diagnosis not present

## 2022-07-09 LAB — CULTURE, BLOOD (ROUTINE X 2)
Culture: NO GROWTH
Special Requests: ADEQUATE

## 2022-07-13 DIAGNOSIS — A419 Sepsis, unspecified organism: Secondary | ICD-10-CM | POA: Diagnosis not present

## 2022-07-13 DIAGNOSIS — R5381 Other malaise: Secondary | ICD-10-CM | POA: Diagnosis not present

## 2022-07-14 DIAGNOSIS — M869 Osteomyelitis, unspecified: Secondary | ICD-10-CM | POA: Diagnosis not present

## 2022-07-14 DIAGNOSIS — A419 Sepsis, unspecified organism: Secondary | ICD-10-CM | POA: Diagnosis not present

## 2022-07-14 DIAGNOSIS — E119 Type 2 diabetes mellitus without complications: Secondary | ICD-10-CM | POA: Diagnosis not present

## 2022-07-14 DIAGNOSIS — Z7189 Other specified counseling: Secondary | ICD-10-CM | POA: Diagnosis not present

## 2022-07-14 DIAGNOSIS — L89154 Pressure ulcer of sacral region, stage 4: Secondary | ICD-10-CM | POA: Diagnosis not present

## 2022-07-23 DIAGNOSIS — R296 Repeated falls: Secondary | ICD-10-CM | POA: Diagnosis not present

## 2022-07-26 DIAGNOSIS — U071 COVID-19: Secondary | ICD-10-CM | POA: Diagnosis not present

## 2022-07-26 DIAGNOSIS — M869 Osteomyelitis, unspecified: Secondary | ICD-10-CM | POA: Diagnosis not present

## 2022-07-26 DIAGNOSIS — L89154 Pressure ulcer of sacral region, stage 4: Secondary | ICD-10-CM | POA: Diagnosis not present

## 2022-07-26 DIAGNOSIS — R509 Fever, unspecified: Secondary | ICD-10-CM | POA: Diagnosis not present

## 2022-07-28 ENCOUNTER — Inpatient Hospital Stay: Payer: Medicare HMO | Admitting: Internal Medicine

## 2022-08-02 DIAGNOSIS — Z79899 Other long term (current) drug therapy: Secondary | ICD-10-CM | POA: Diagnosis not present

## 2022-08-02 DIAGNOSIS — I480 Paroxysmal atrial fibrillation: Secondary | ICD-10-CM | POA: Diagnosis not present

## 2022-08-02 DIAGNOSIS — I10 Essential (primary) hypertension: Secondary | ICD-10-CM | POA: Diagnosis not present

## 2022-08-03 DIAGNOSIS — F3132 Bipolar disorder, current episode depressed, moderate: Secondary | ICD-10-CM | POA: Diagnosis not present

## 2022-08-03 DIAGNOSIS — G478 Other sleep disorders: Secondary | ICD-10-CM | POA: Diagnosis not present

## 2022-08-03 DIAGNOSIS — G2401 Drug induced subacute dyskinesia: Secondary | ICD-10-CM | POA: Diagnosis not present

## 2022-08-04 DIAGNOSIS — L89154 Pressure ulcer of sacral region, stage 4: Secondary | ICD-10-CM | POA: Diagnosis not present

## 2022-08-05 ENCOUNTER — Inpatient Hospital Stay: Payer: Medicare HMO | Admitting: Internal Medicine

## 2022-08-05 ENCOUNTER — Telehealth: Payer: Self-pay

## 2022-08-05 NOTE — Telephone Encounter (Signed)
Christian [name] at [facility] called to cancel patient appointment today with Dr.Vu due to patient having other health issues. Will call back to reschedule.    Christian [name] Christian [name], CMA

## 2022-08-06 DIAGNOSIS — U071 COVID-19: Secondary | ICD-10-CM | POA: Diagnosis not present

## 2022-08-06 DIAGNOSIS — M869 Osteomyelitis, unspecified: Secondary | ICD-10-CM | POA: Diagnosis not present

## 2022-08-06 DIAGNOSIS — R296 Repeated falls: Secondary | ICD-10-CM | POA: Diagnosis not present

## 2022-08-06 DIAGNOSIS — L89154 Pressure ulcer of sacral region, stage 4: Secondary | ICD-10-CM | POA: Diagnosis not present

## 2022-08-09 DIAGNOSIS — L89154 Pressure ulcer of sacral region, stage 4: Secondary | ICD-10-CM | POA: Diagnosis not present

## 2022-08-09 DIAGNOSIS — S91209A Unspecified open wound of unspecified toe(s) with damage to nail, initial encounter: Secondary | ICD-10-CM | POA: Diagnosis not present

## 2022-08-09 DIAGNOSIS — A419 Sepsis, unspecified organism: Secondary | ICD-10-CM | POA: Diagnosis not present

## 2022-08-09 DIAGNOSIS — Z515 Encounter for palliative care: Secondary | ICD-10-CM | POA: Diagnosis not present

## 2022-08-09 DIAGNOSIS — M869 Osteomyelitis, unspecified: Secondary | ICD-10-CM | POA: Diagnosis not present

## 2022-08-10 DIAGNOSIS — G2401 Drug induced subacute dyskinesia: Secondary | ICD-10-CM | POA: Diagnosis not present

## 2022-08-10 DIAGNOSIS — F3132 Bipolar disorder, current episode depressed, moderate: Secondary | ICD-10-CM | POA: Diagnosis not present

## 2022-08-10 DIAGNOSIS — R8290 Unspecified abnormal findings in urine: Secondary | ICD-10-CM | POA: Diagnosis not present

## 2022-08-10 DIAGNOSIS — G478 Other sleep disorders: Secondary | ICD-10-CM | POA: Diagnosis not present

## 2022-08-11 DIAGNOSIS — M869 Osteomyelitis, unspecified: Secondary | ICD-10-CM | POA: Diagnosis not present

## 2022-08-11 DIAGNOSIS — E119 Type 2 diabetes mellitus without complications: Secondary | ICD-10-CM | POA: Diagnosis not present

## 2022-08-11 DIAGNOSIS — N390 Urinary tract infection, site not specified: Secondary | ICD-10-CM | POA: Diagnosis not present

## 2022-08-11 DIAGNOSIS — I480 Paroxysmal atrial fibrillation: Secondary | ICD-10-CM | POA: Diagnosis not present

## 2022-08-11 DIAGNOSIS — Z515 Encounter for palliative care: Secondary | ICD-10-CM | POA: Diagnosis not present

## 2022-08-11 DIAGNOSIS — L89154 Pressure ulcer of sacral region, stage 4: Secondary | ICD-10-CM | POA: Diagnosis not present

## 2022-08-25 DIAGNOSIS — L89154 Pressure ulcer of sacral region, stage 4: Secondary | ICD-10-CM | POA: Diagnosis not present

## 2022-09-07 DIAGNOSIS — F319 Bipolar disorder, unspecified: Secondary | ICD-10-CM | POA: Diagnosis not present

## 2022-09-07 DIAGNOSIS — E039 Hypothyroidism, unspecified: Secondary | ICD-10-CM | POA: Diagnosis not present

## 2022-09-07 DIAGNOSIS — I480 Paroxysmal atrial fibrillation: Secondary | ICD-10-CM | POA: Diagnosis not present

## 2022-09-15 DIAGNOSIS — L89154 Pressure ulcer of sacral region, stage 4: Secondary | ICD-10-CM | POA: Diagnosis not present

## 2022-09-15 DIAGNOSIS — E119 Type 2 diabetes mellitus without complications: Secondary | ICD-10-CM | POA: Diagnosis not present

## 2022-10-06 NOTE — Telephone Encounter (Signed)
Error

## 2022-10-13 DIAGNOSIS — E039 Hypothyroidism, unspecified: Secondary | ICD-10-CM | POA: Diagnosis not present

## 2022-10-13 DIAGNOSIS — E119 Type 2 diabetes mellitus without complications: Secondary | ICD-10-CM | POA: Diagnosis not present

## 2022-10-13 DIAGNOSIS — F319 Bipolar disorder, unspecified: Secondary | ICD-10-CM | POA: Diagnosis not present

## 2022-10-13 DIAGNOSIS — L89154 Pressure ulcer of sacral region, stage 4: Secondary | ICD-10-CM | POA: Diagnosis not present

## 2022-10-20 DIAGNOSIS — L89610 Pressure ulcer of right heel, unstageable: Secondary | ICD-10-CM | POA: Diagnosis not present

## 2022-10-20 DIAGNOSIS — E119 Type 2 diabetes mellitus without complications: Secondary | ICD-10-CM | POA: Diagnosis not present

## 2022-10-20 DIAGNOSIS — I872 Venous insufficiency (chronic) (peripheral): Secondary | ICD-10-CM | POA: Diagnosis not present

## 2022-10-20 DIAGNOSIS — I739 Peripheral vascular disease, unspecified: Secondary | ICD-10-CM | POA: Diagnosis not present

## 2022-11-01 DIAGNOSIS — Z515 Encounter for palliative care: Secondary | ICD-10-CM | POA: Diagnosis not present

## 2022-11-01 DIAGNOSIS — Z79899 Other long term (current) drug therapy: Secondary | ICD-10-CM | POA: Diagnosis not present

## 2022-11-03 DIAGNOSIS — I739 Peripheral vascular disease, unspecified: Secondary | ICD-10-CM | POA: Diagnosis not present

## 2022-11-03 DIAGNOSIS — I872 Venous insufficiency (chronic) (peripheral): Secondary | ICD-10-CM | POA: Diagnosis not present

## 2022-11-03 DIAGNOSIS — E119 Type 2 diabetes mellitus without complications: Secondary | ICD-10-CM | POA: Diagnosis not present

## 2022-11-03 DIAGNOSIS — L89610 Pressure ulcer of right heel, unstageable: Secondary | ICD-10-CM | POA: Diagnosis not present

## 2022-11-20 DIAGNOSIS — N390 Urinary tract infection, site not specified: Secondary | ICD-10-CM | POA: Diagnosis not present

## 2022-12-01 DIAGNOSIS — L89890 Pressure ulcer of other site, unstageable: Secondary | ICD-10-CM | POA: Diagnosis not present

## 2022-12-01 DIAGNOSIS — I739 Peripheral vascular disease, unspecified: Secondary | ICD-10-CM | POA: Diagnosis not present

## 2022-12-01 DIAGNOSIS — I872 Venous insufficiency (chronic) (peripheral): Secondary | ICD-10-CM | POA: Diagnosis not present

## 2022-12-01 DIAGNOSIS — E119 Type 2 diabetes mellitus without complications: Secondary | ICD-10-CM | POA: Diagnosis not present

## 2022-12-29 DIAGNOSIS — L89890 Pressure ulcer of other site, unstageable: Secondary | ICD-10-CM | POA: Diagnosis not present

## 2022-12-29 DIAGNOSIS — I739 Peripheral vascular disease, unspecified: Secondary | ICD-10-CM | POA: Diagnosis not present

## 2022-12-29 DIAGNOSIS — I872 Venous insufficiency (chronic) (peripheral): Secondary | ICD-10-CM | POA: Diagnosis not present

## 2022-12-29 DIAGNOSIS — E119 Type 2 diabetes mellitus without complications: Secondary | ICD-10-CM | POA: Diagnosis not present

## 2023-02-02 DIAGNOSIS — N390 Urinary tract infection, site not specified: Secondary | ICD-10-CM | POA: Diagnosis not present

## 2023-02-28 DEATH — deceased

## 2023-07-03 IMAGING — CT CT HEAD W/O CM
2 of 3 series · 14 of 47 positions shown, 17 images · non-contrast
Comparison: April 02, 2021

CLINICAL DATA: Altered mental status.



[Series 2: head wo · axial · 0.51mm/px · z∈[-89,+36]mm · 11 of 31 slices shown, 14 images]
[im 3/31  brain]
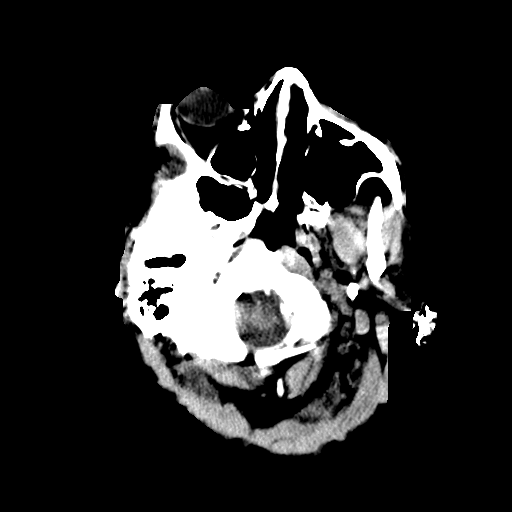
[im 3/31  bone]
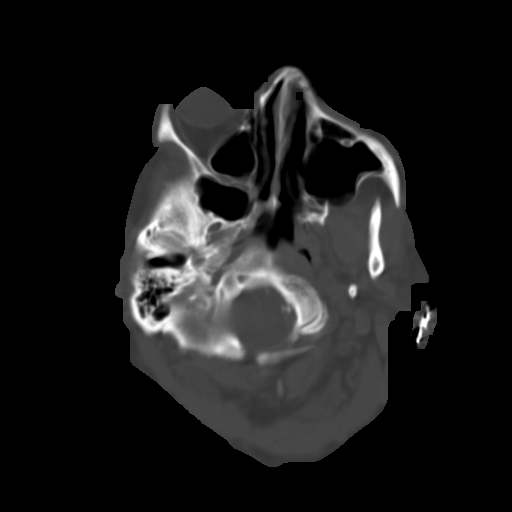
[im 5/31  brain]
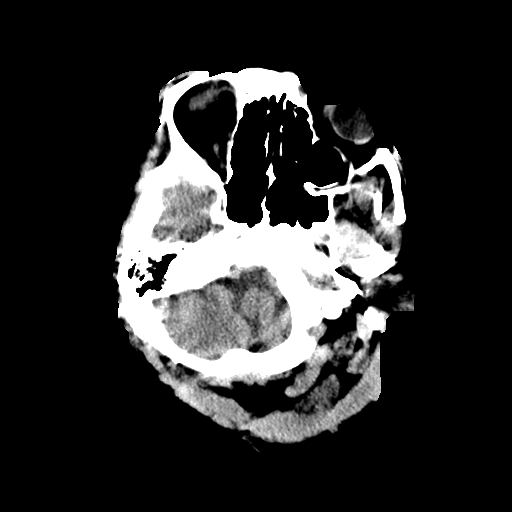
[im 8/31  brain]
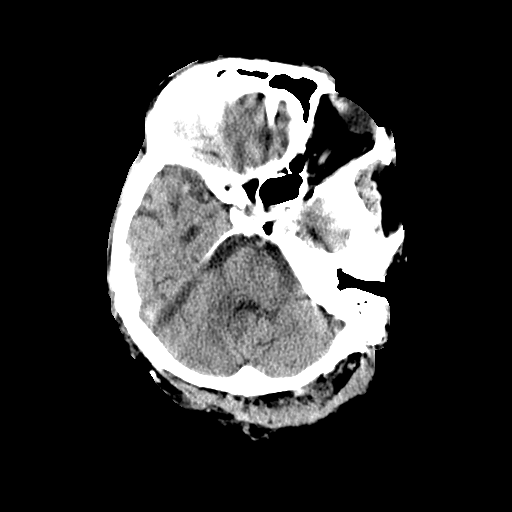
[im 10/31  brain]
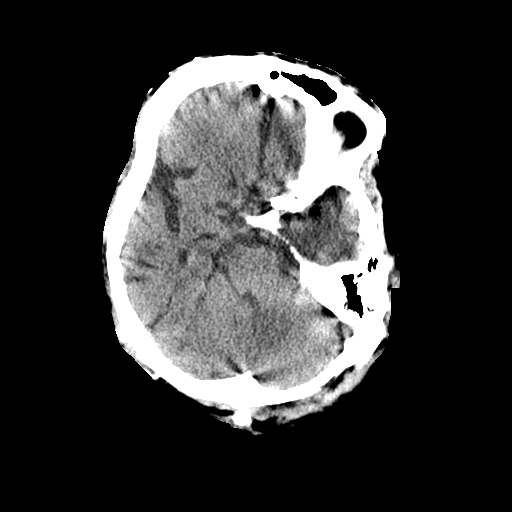
[im 13/31  brain]
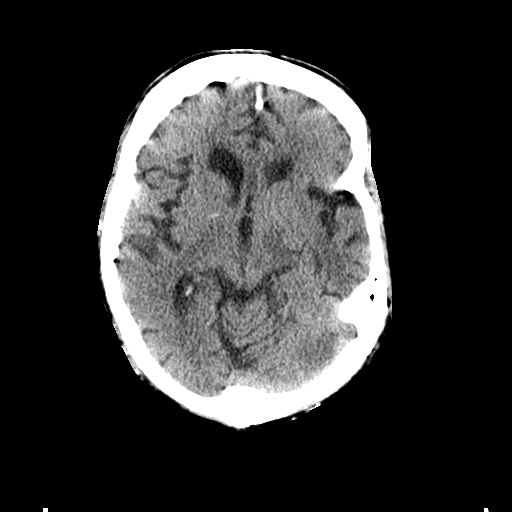
[im 13/31  bone]
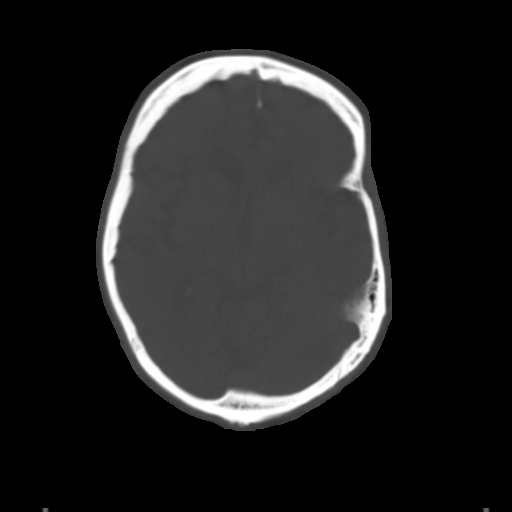
[im 16/31  brain]
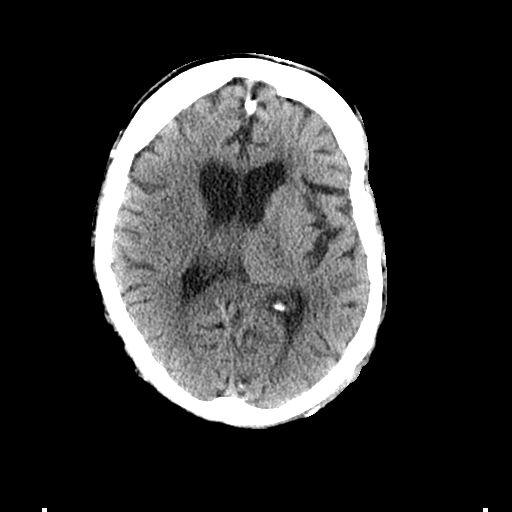
[im 18/31  brain]
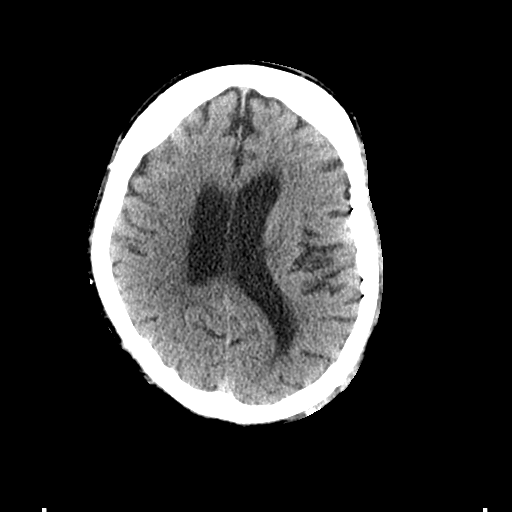
[im 21/31  brain]
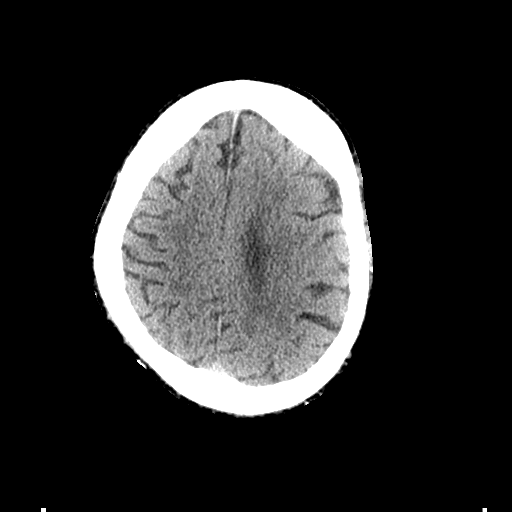
[im 23/31  brain]
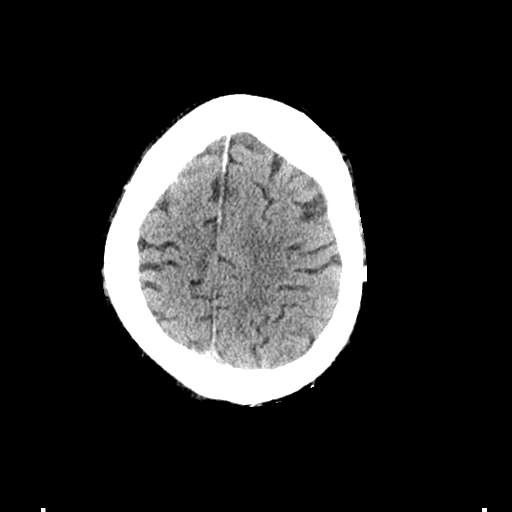
[im 23/31  bone]
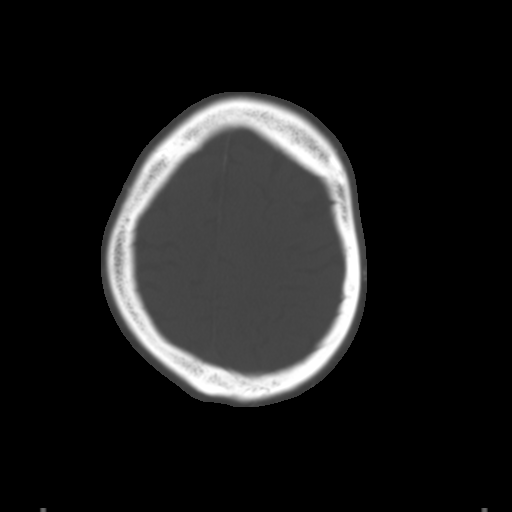
[im 26/31  brain]
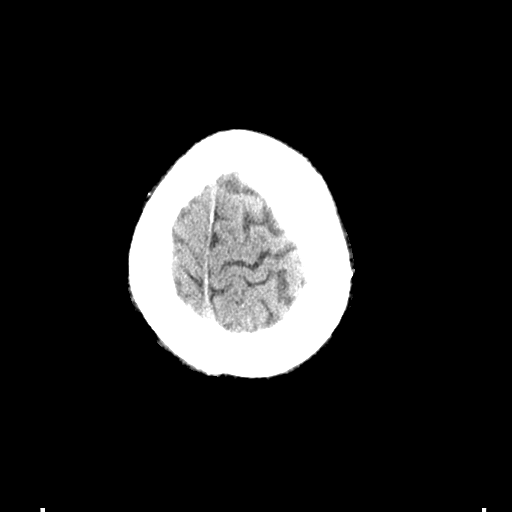
[im 28/31  brain]
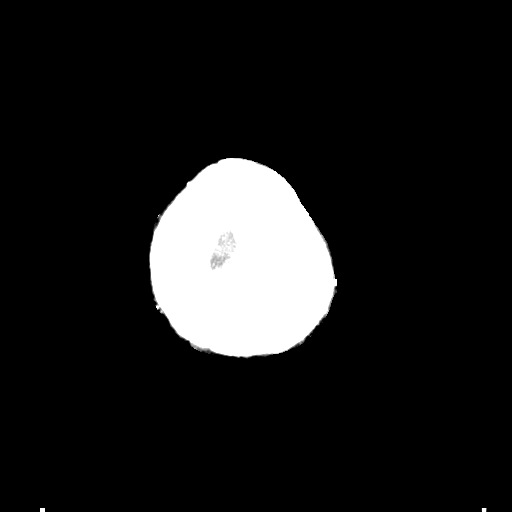

[Series 4: coronal soft tissue · coronal · 0.31mm/px · 3 of 69 slices shown]
[im 23/69  brain]
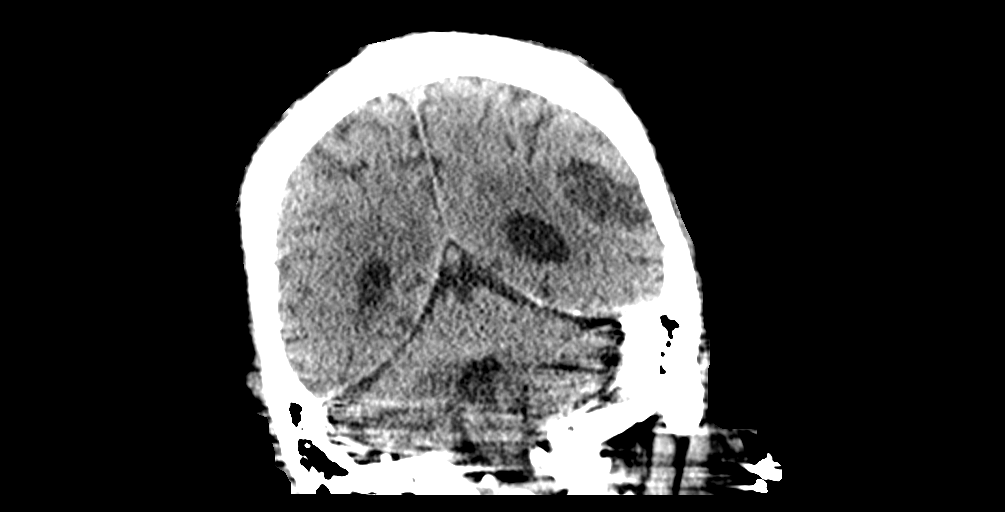
[im 31/69  brain]
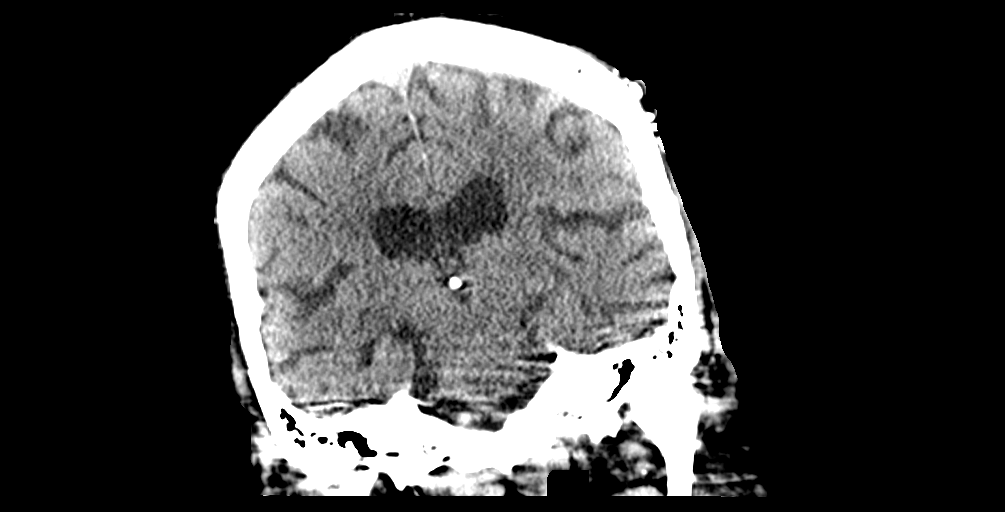
[im 38/69  brain]
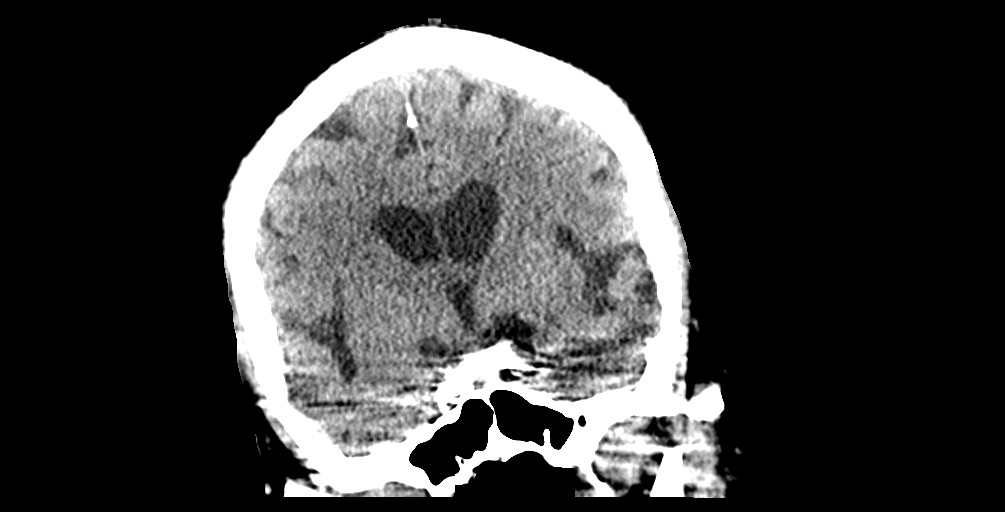

[14 of 47 positions shown; findings below may reference images not displayed]

FINDINGS: Brain: There is mild cerebral atrophy with widening of the
extra-axial spaces and ventricular dilatation.
There are areas of decreased attenuation within the white matter
tracts of the supratentorial brain, consistent with microvascular
disease changes.

Vascular: No hyperdense vessel or unexpected calcification.

Skull: Normal. Negative for fracture or focal lesion.

Sinuses/Orbits: No acute finding.

Other: The study is limited secondary to patient motion.
IMPRESSION: 1. Limited study secondary to patient motion.
2. No acute intracranial abnormality.

## 2023-07-03 IMAGING — DX DG CHEST 1V PORT
1 series · 1 of 1 positions shown · non-contrast
Comparison: 01/26/2022

CLINICAL DATA: Hypoxia and hypotension.  Bilateral leg edema.

EXAM:
PORTABLE CHEST 1 VIEW

[chest ap]
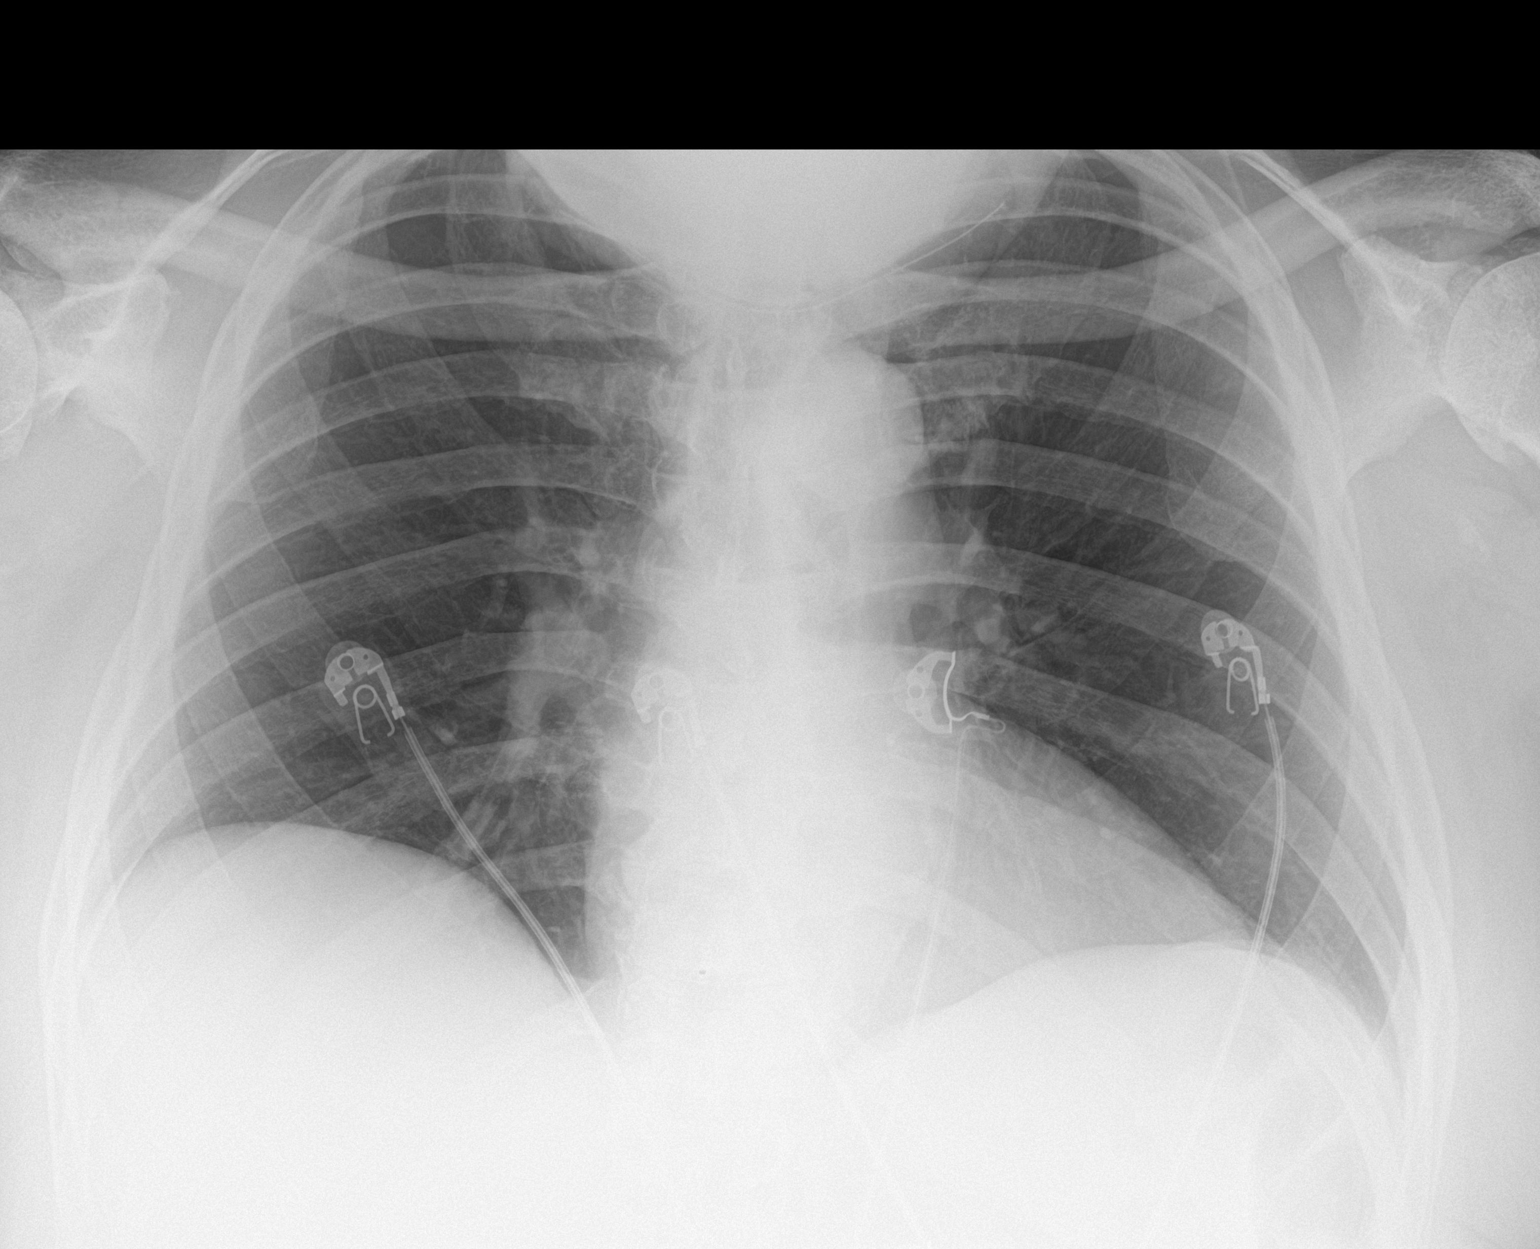

[1 of 1 positions shown; findings below may reference images not displayed]

FINDINGS: Heart size within normal limits. Vascularity normal. Negative for
heart failure or edema. Lungs clear without infiltrate or effusion.
IMPRESSION: No active disease.
